# Patient Record
Sex: Male | Born: 1960
Health system: Southern US, Community
[De-identification: ages and names within clinical notes are randomized; demographics above are authoritative.]

## PROBLEM LIST (undated history)

## (undated) DIAGNOSIS — E785 Hyperlipidemia, unspecified: Secondary | ICD-10-CM

## (undated) DIAGNOSIS — I639 Cerebral infarction, unspecified: Secondary | ICD-10-CM

## (undated) DIAGNOSIS — I469 Cardiac arrest, cause unspecified: Secondary | ICD-10-CM

## (undated) DIAGNOSIS — R059 Cough, unspecified: Secondary | ICD-10-CM

## (undated) DIAGNOSIS — J45909 Unspecified asthma, uncomplicated: Secondary | ICD-10-CM

## (undated) DIAGNOSIS — I6330 Cerebral infarction due to thrombosis of unspecified cerebral artery: Secondary | ICD-10-CM

## (undated) DIAGNOSIS — R05 Cough: Secondary | ICD-10-CM

## (undated) DIAGNOSIS — R0789 Other chest pain: Secondary | ICD-10-CM

## (undated) DIAGNOSIS — N179 Acute kidney failure, unspecified: Secondary | ICD-10-CM

## (undated) DIAGNOSIS — K922 Gastrointestinal hemorrhage, unspecified: Secondary | ICD-10-CM

## (undated) DIAGNOSIS — I10 Essential (primary) hypertension: Secondary | ICD-10-CM

## (undated) DIAGNOSIS — G2581 Restless legs syndrome: Secondary | ICD-10-CM

## (undated) DIAGNOSIS — N185 Chronic kidney disease, stage 5: Secondary | ICD-10-CM

## (undated) DIAGNOSIS — I340 Nonrheumatic mitral (valve) insufficiency: Secondary | ICD-10-CM

## (undated) DIAGNOSIS — I272 Pulmonary hypertension, unspecified: Secondary | ICD-10-CM

## (undated) DIAGNOSIS — I5033 Acute on chronic diastolic (congestive) heart failure: Secondary | ICD-10-CM

## (undated) DIAGNOSIS — I712 Thoracic aortic aneurysm, without rupture: Secondary | ICD-10-CM

## (undated) DIAGNOSIS — I7121 Aneurysm of the ascending aorta, without rupture: Secondary | ICD-10-CM

## (undated) DIAGNOSIS — N189 Chronic kidney disease, unspecified: Secondary | ICD-10-CM

## (undated) DIAGNOSIS — N186 End stage renal disease: Secondary | ICD-10-CM

## (undated) DIAGNOSIS — I350 Nonrheumatic aortic (valve) stenosis: Secondary | ICD-10-CM

## (undated) DIAGNOSIS — I160 Hypertensive urgency: Secondary | ICD-10-CM

## (undated) DIAGNOSIS — I071 Rheumatic tricuspid insufficiency: Secondary | ICD-10-CM

## (undated) DIAGNOSIS — K5900 Constipation, unspecified: Secondary | ICD-10-CM

## (undated) DIAGNOSIS — K219 Gastro-esophageal reflux disease without esophagitis: Secondary | ICD-10-CM

## (undated) DIAGNOSIS — R0600 Dyspnea, unspecified: Secondary | ICD-10-CM

## (undated) DIAGNOSIS — Z8673 Personal history of transient ischemic attack (TIA), and cerebral infarction without residual deficits: Secondary | ICD-10-CM

## (undated) DIAGNOSIS — R0602 Shortness of breath: Secondary | ICD-10-CM

## (undated) DIAGNOSIS — J9600 Acute respiratory failure, unspecified whether with hypoxia or hypercapnia: Secondary | ICD-10-CM

## (undated) DIAGNOSIS — I351 Nonrheumatic aortic (valve) insufficiency: Secondary | ICD-10-CM

## (undated) DIAGNOSIS — J189 Pneumonia, unspecified organism: Secondary | ICD-10-CM

## (undated) DIAGNOSIS — J449 Chronic obstructive pulmonary disease, unspecified: Secondary | ICD-10-CM

## (undated) DIAGNOSIS — I4892 Unspecified atrial flutter: Secondary | ICD-10-CM

## (undated) DIAGNOSIS — M1990 Unspecified osteoarthritis, unspecified site: Secondary | ICD-10-CM

## (undated) DIAGNOSIS — J45901 Unspecified asthma with (acute) exacerbation: Secondary | ICD-10-CM

## (undated) DIAGNOSIS — I959 Hypotension, unspecified: Secondary | ICD-10-CM

## (undated) DIAGNOSIS — Z992 Dependence on renal dialysis: Secondary | ICD-10-CM

## (undated) DIAGNOSIS — N19 Unspecified kidney failure: Secondary | ICD-10-CM

## (undated) DIAGNOSIS — H269 Unspecified cataract: Secondary | ICD-10-CM

## (undated) DIAGNOSIS — D649 Anemia, unspecified: Secondary | ICD-10-CM

## (undated) HISTORY — DX: Constipation, unspecified: K59.00

## (undated) HISTORY — PX: OTHER SURGICAL HISTORY: SHX169

## (undated) HISTORY — DX: Essential (primary) hypertension: I10

## (undated) HISTORY — DX: Chronic kidney disease, unspecified: N18.9

## (undated) HISTORY — DX: Cerebral infarction, unspecified: I63.9

## (undated) HISTORY — DX: Restless legs syndrome: G25.81

## (undated) HISTORY — DX: Personal history of transient ischemic attack (TIA), and cerebral infarction without residual deficits: Z86.73

## (undated) HISTORY — DX: Chronic kidney disease, stage 5: N18.5

## (undated) HISTORY — DX: Acute respiratory failure, unspecified whether with hypoxia or hypercapnia: J96.00

## (undated) HISTORY — DX: Pneumonia, unspecified organism: J18.9

## (undated) HISTORY — DX: End stage renal disease: N18.6

## (undated) HISTORY — DX: Acute kidney failure, unspecified: N17.9

## (undated) HISTORY — DX: Dependence on renal dialysis: Z99.2

## (undated) HISTORY — DX: Other chest pain: R07.89

## (undated) HISTORY — DX: Hypertensive urgency: I16.0

## (undated) HISTORY — DX: Cerebral infarction due to thrombosis of unspecified cerebral artery: I63.30

## (undated) HISTORY — DX: Unspecified kidney failure: N19

## (undated) HISTORY — DX: Unspecified asthma with (acute) exacerbation: J45.901

## (undated) HISTORY — PX: FRACTURE SURGERY: SHX138

## (undated) HISTORY — DX: Hyperlipidemia, unspecified: E78.5

## (undated) HISTORY — DX: Gastrointestinal hemorrhage, unspecified: K92.2

## (undated) SURGERY — Surgical Case
Anesthesia: *Unknown

## (undated) NOTE — *Deleted (*Deleted)
Pharmacy Resident Rounding Note - for learning purposes only, not an active part of the chart   S/o  Admit Complaint: dry cough, SOB, swelling  Anticoagulation  Infectious Disease Cardiovascular Endocrinology Gastrointestinal / Nutrition Neurology Nephrology T/T/Sat  Last session 11/9 (3.5h, 3L UF) -- HD 11/15 (ongoing) Aranesp 200 mcg x1  --no recent iron panel   K 5.7 >> gave lokelma x1, albuterol  Ca 8.7, phos 8.6 Alb 3.9  Hgb 8.1, PLT 130-150   Pulmonary Hematology / Oncology PTA Medication Issues Best Practices   ESRD -follow lytes, HD sessions -plan for aranesp, iron panel?

---
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
## 2007-01-19 ENCOUNTER — Emergency Department (HOSPITAL_COMMUNITY): Admission: EM | Admit: 2007-01-19 | Discharge: 2007-01-19 | Payer: Self-pay | Admitting: Emergency Medicine

## 2007-09-24 IMAGING — CR DG HAND COMPLETE 3+V*L*
3 series · 3 of 3 positions shown · non-contrast
Comparison: none

CLINICAL DATA: Hand caught in grinder, blunt trauma.  
 LEFT HAND ? 3 VIEWS ? 01/19/07:

[view not recorded (1 of 3)]
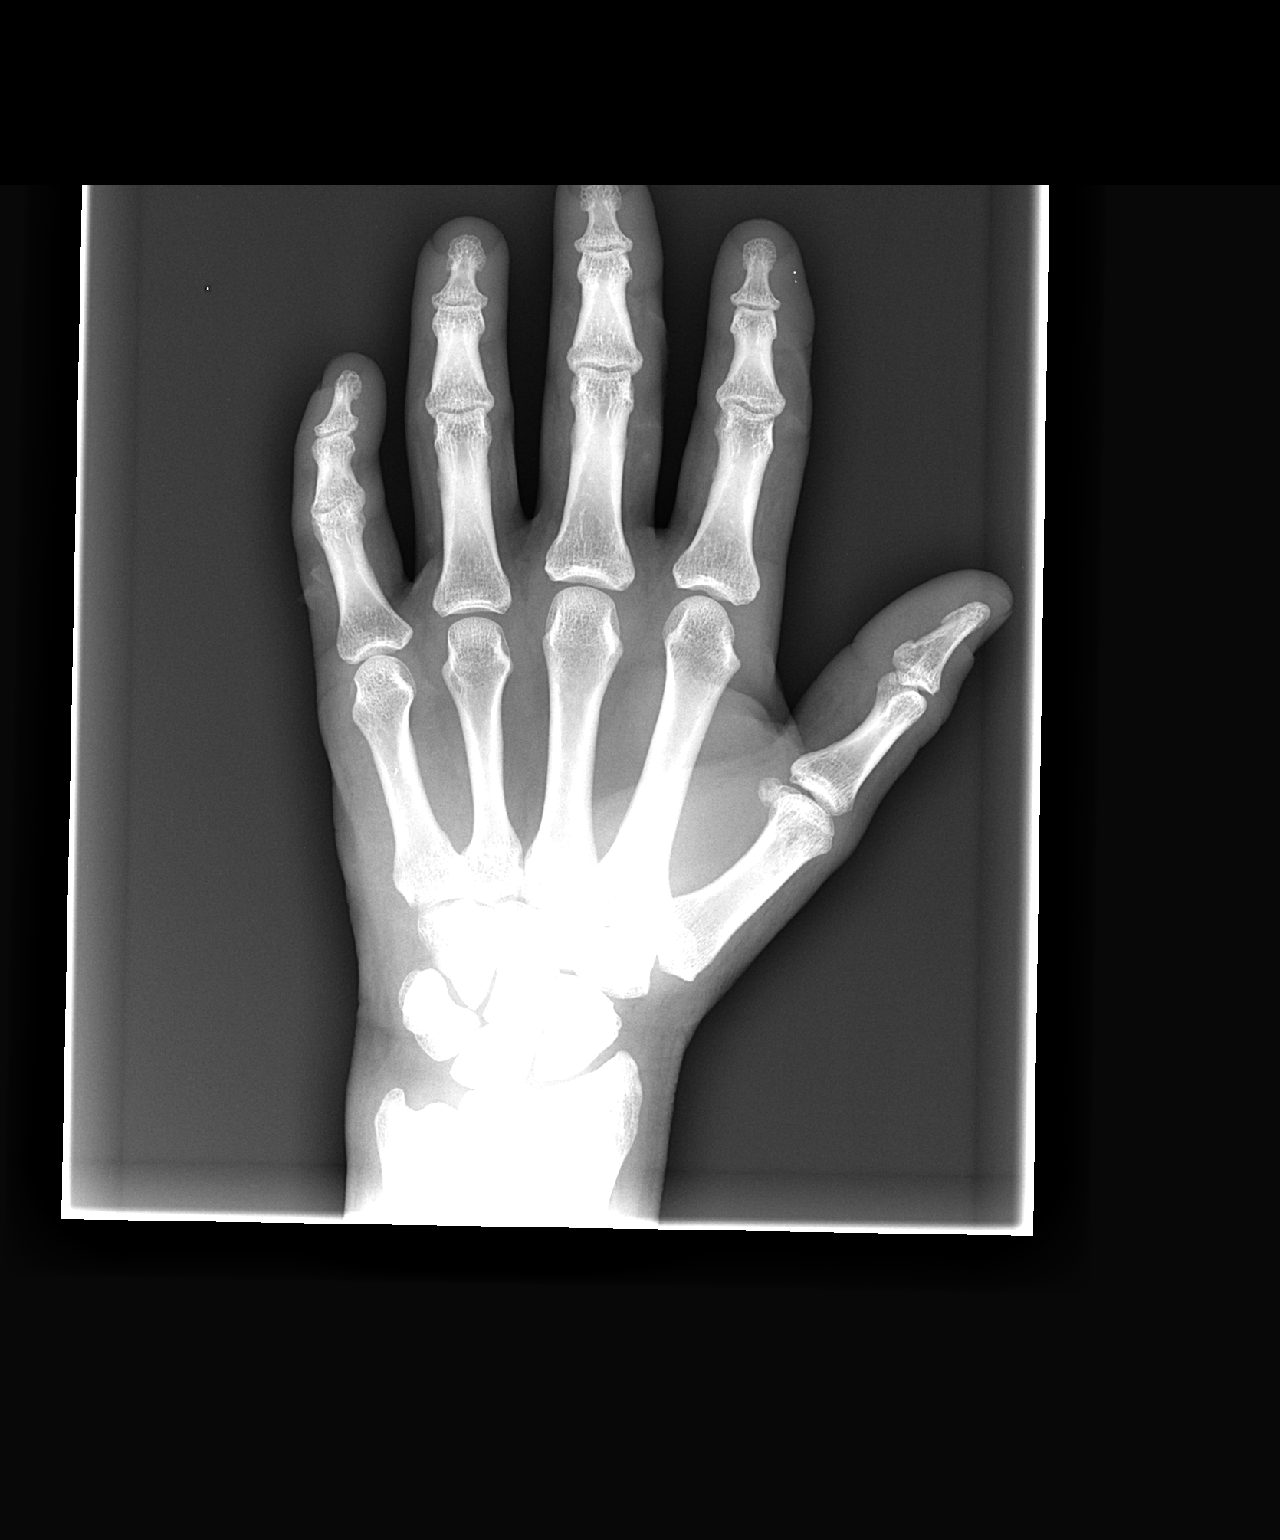

[view not recorded (2 of 3)]
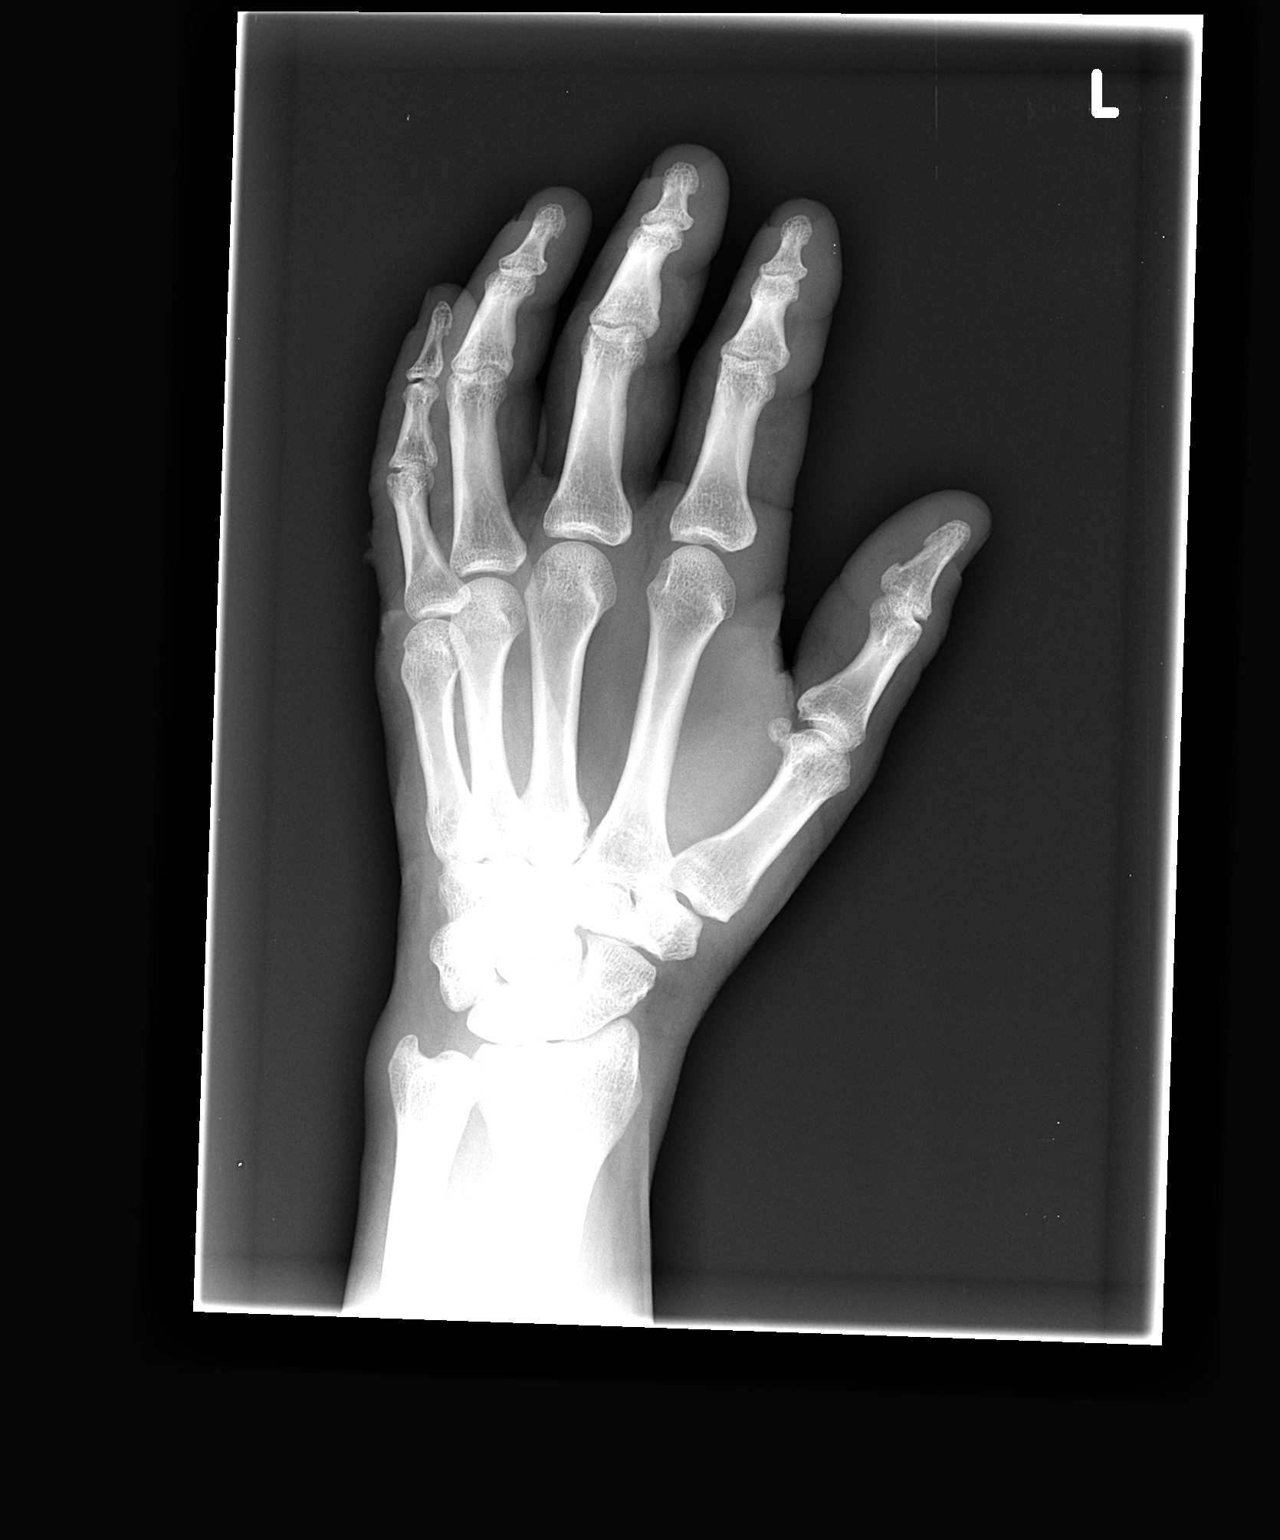

[view not recorded (3 of 3)]
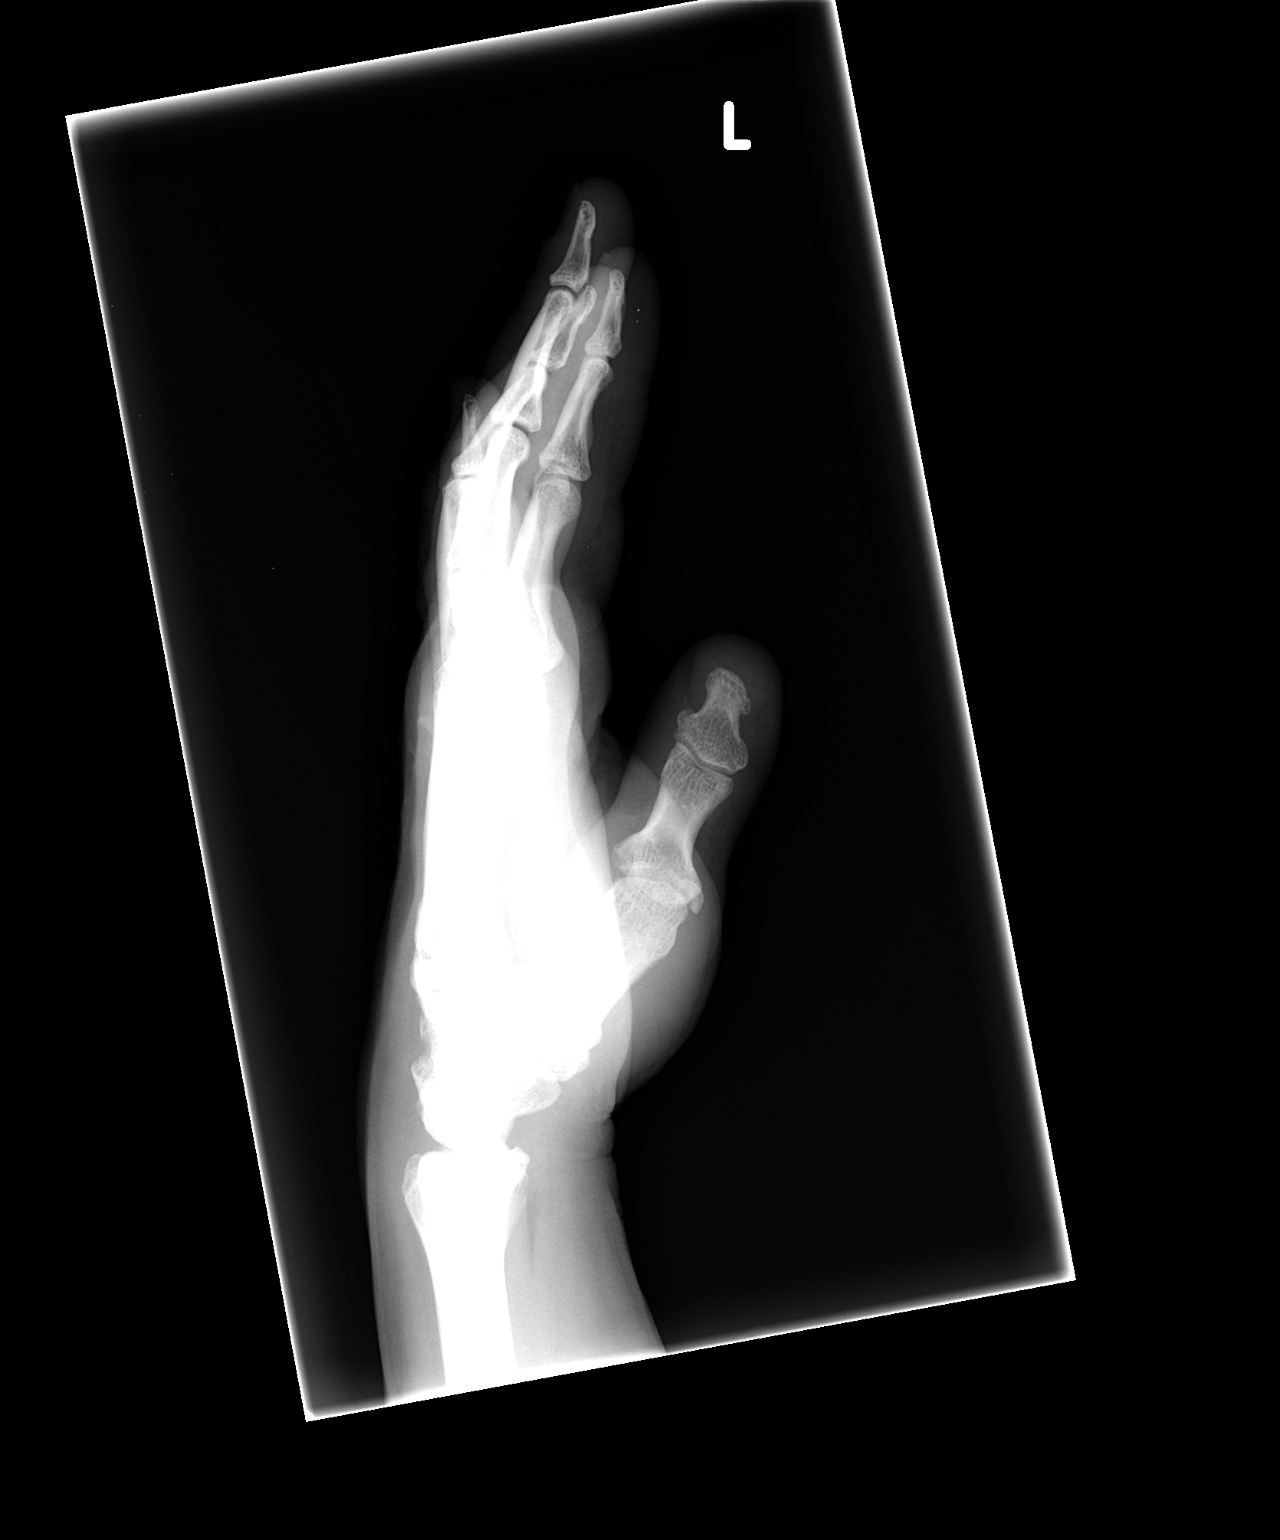

[3 of 3 positions shown; findings below may reference images not displayed]

FINDINGS: There is soft tissue irregularity posteriorly.  However, there is no evidence of bone or joint abnormality.
IMPRESSION: No bone or joint abnormality.

## 2013-03-23 ENCOUNTER — Emergency Department (HOSPITAL_COMMUNITY)
Admission: EM | Admit: 2013-03-23 | Discharge: 2013-03-23 | Disposition: A | Discharge status: Home / Self Care | Payer: Self-pay | Attending: Emergency Medicine | Admitting: Emergency Medicine

## 2013-03-23 ENCOUNTER — Encounter (HOSPITAL_COMMUNITY): Payer: Self-pay | Admitting: Emergency Medicine

## 2013-03-23 DIAGNOSIS — N289 Disorder of kidney and ureter, unspecified: Secondary | ICD-10-CM

## 2013-03-23 DIAGNOSIS — J45909 Unspecified asthma, uncomplicated: Secondary | ICD-10-CM | POA: Insufficient documentation

## 2013-03-23 DIAGNOSIS — I10 Essential (primary) hypertension: Secondary | ICD-10-CM

## 2013-03-23 HISTORY — DX: Unspecified asthma, uncomplicated: J45.909

## 2013-03-23 HISTORY — DX: Essential (primary) hypertension: I10

## 2013-03-23 LAB — CBC WITH DIFFERENTIAL/PLATELET
Basophils Absolute: 0 10*3/uL (ref 0.0–0.1)
Basophils Relative: 1 % (ref 0–1)
Eosinophils Absolute: 0.6 10*3/uL (ref 0.0–0.7)
Eosinophils Relative: 8 % — ABNORMAL HIGH (ref 0–5)
HCT: 45.1 % (ref 39.0–52.0)
Hemoglobin: 16.3 g/dL (ref 13.0–17.0)
Lymphocytes Relative: 26 % (ref 12–46)
Lymphs Abs: 1.9 10*3/uL (ref 0.7–4.0)
MCH: 32.9 pg (ref 26.0–34.0)
MCHC: 36.1 g/dL — ABNORMAL HIGH (ref 30.0–36.0)
MCV: 90.9 fL (ref 78.0–100.0)
Monocytes Absolute: 0.6 10*3/uL (ref 0.1–1.0)
Monocytes Relative: 9 % (ref 3–12)
Neutro Abs: 4.1 10*3/uL (ref 1.7–7.7)
Neutrophils Relative %: 57 % (ref 43–77)
Platelets: 204 10*3/uL (ref 150–400)
RBC: 4.96 MIL/uL (ref 4.22–5.81)
RDW: 12.8 % (ref 11.5–15.5)
WBC: 7.3 10*3/uL (ref 4.0–10.5)

## 2013-03-23 LAB — COMPREHENSIVE METABOLIC PANEL
ALT: 16 U/L (ref 0–53)
AST: 19 U/L (ref 0–37)
Albumin: 3.3 g/dL — ABNORMAL LOW (ref 3.5–5.2)
Alkaline Phosphatase: 69 U/L (ref 39–117)
BUN: 19 mg/dL (ref 6–23)
CO2: 28 mEq/L (ref 19–32)
Calcium: 9.3 mg/dL (ref 8.4–10.5)
Chloride: 105 mEq/L (ref 96–112)
Creatinine, Ser: 1.67 mg/dL — ABNORMAL HIGH (ref 0.50–1.35)
GFR calc Af Amer: 52 mL/min — ABNORMAL LOW (ref >90)
GFR calc non Af Amer: 45 mL/min — ABNORMAL LOW (ref >90)
Glucose, Bld: 101 mg/dL — ABNORMAL HIGH (ref 70–99)
Potassium: 3.6 mEq/L (ref 3.5–5.1)
Sodium: 142 mEq/L (ref 135–145)
Total Bilirubin: 0.4 mg/dL (ref 0.3–1.2)
Total Protein: 6.6 g/dL (ref 6.0–8.3)

## 2013-03-23 LAB — URINE MICROSCOPIC-ADD ON

## 2013-03-23 LAB — URINALYSIS, ROUTINE W REFLEX MICROSCOPIC
Bilirubin Urine: NEGATIVE
Glucose, UA: NEGATIVE mg/dL
Ketones, ur: NEGATIVE mg/dL
Leukocytes, UA: NEGATIVE
Nitrite: NEGATIVE
Protein, ur: >300 mg/dL — AB
Specific Gravity, Urine: 1.022 (ref 1.005–1.030)
Urobilinogen, UA: 0.2 mg/dL (ref 0.0–1.0)
pH: 5.5 (ref 5.0–8.0)

## 2013-03-23 MED ORDER — LISINOPRIL 10 MG PO TABS: 10.0000 mg | ORAL_TABLET | Freq: Every day | ORAL | Status: DC

## 2013-03-23 MED ORDER — ALBUTEROL SULFATE HFA 108 (90 BASE) MCG/ACT IN AERS
2.0000 | INHALATION_SPRAY | Freq: Once | RESPIRATORY_TRACT | Status: AC
  Administered 2013-03-23: 2 via RESPIRATORY_TRACT
  Filled 2013-03-23: qty 6.7

## 2013-03-23 NOTE — ED Notes (Signed)
Pt c/o hypertension when had medical screening exam; pt denies complaint

## 2013-03-23 NOTE — ED Notes (Signed)
Patient endorses use of someone elses Advair inhaler for respiratory Sx. Unsure of dose. Use every few days at the most. Left upper chest tightness. Sx not present at this time

## 2013-03-27 NOTE — ED Provider Notes (Signed)
CSN: [id number]     Arrival date & time 03/23/13  1600 History    51yM with hypertension. Noted on pre-employment evaluation and referred to ED. Pt reports told had hypertension as far back as 5 years ago. Never on meds. No regular medical care. Pt with no complaints. No frequent HA. No CP or SOB. No visual complaints. No issues with swelling. No urinary complaints. Non smoker. Exercises regularly.    First MD Initiated Contact with Patient 03/23/13 1658     Chief Complaint  Patient presents with  . Hypertension   (Consider location/radiation/quality/duration/timing/severity/associated sxs/prior Treatment) HPI  Past Medical History  Diagnosis Date  . Hypertension   . Asthma    History reviewed. No pertinent past surgical history. History reviewed. No pertinent family history. History  Substance Use Topics  . Smoking status: Never Smoker   . Smokeless tobacco: Not on file  . Alcohol Use: Yes    Review of Systems  All systems reviewed and negative, other than as noted in HPI.   Allergies  Review of patient's allergies indicates no known allergies.  Home Medications   Current Outpatient Rx  Name  Route  Sig  Dispense  Refill  . Multiple Vitamin (MULTIVITAMIN WITH MINERALS) TABS   Oral   Take 1 tablet by mouth daily.         [name] lisinopril (PRINIVIL,ZESTRIL) 10 MG tablet   Oral   Take 1 tablet (10 mg total) by mouth daily.   30 tablet   1    BP 207/120  Pulse 80  Temp(Src) 98.2 F (36.8 C) (Oral)  Resp 20  SpO2 97% Physical Exam  Nursing note and vitals reviewed. Constitutional: He appears well-developed and well-nourished. No distress.  HENT:  Head: Normocephalic and atraumatic.  Eyes: Conjunctivae are normal. Right eye exhibits no discharge. Left eye exhibits no discharge.  Neck: Neck supple.  Cardiovascular: Normal rate, regular rhythm and normal heart sounds.  Exam reveals no gallop and no friction rub.   No murmur heard. Pulmonary/Chest: Effort  normal and breath sounds normal. No respiratory distress.  Abdominal: Soft. He exhibits no distension. There is no tenderness.  Musculoskeletal: He exhibits no edema and no tenderness.  Neurological: He is alert.  Skin: Skin is warm and dry.  Psychiatric: He has a normal mood and affect. His behavior is normal. Thought content normal.    ED Course   Procedures (including critical care time)  Labs Reviewed  CBC WITH DIFFERENTIAL - Abnormal; Notable for the following:    MCHC 36.1 (*)    Eosinophils Relative 8 (*)    All other components within normal limits  COMPREHENSIVE METABOLIC PANEL - Abnormal; Notable for the following:    Glucose, Bld 101 (*)    Creatinine, Ser 1.67 (*)    Albumin 3.3 (*)    GFR calc non Af Amer 45 (*)    GFR calc Af Amer 52 (*)    All other components within normal limits  URINALYSIS, ROUTINE W REFLEX MICROSCOPIC - Abnormal; Notable for the following:    Hgb urine dipstick SMALL (*)    Protein, ur >300 (*)    All other components within normal limits  URINE MICROSCOPIC-ADD ON - Abnormal; Notable for the following:    Casts HYALINE CASTS (*)    All other components within normal limits   No results found. 1. Hypertension   2. Renal impairment     MDM  51yM with asymptomatic hypertension. Renal impairment which I suspect is  chronic and related at least partially to longstanding hypertension. Labs ordered by nursing. Prescribed low dose ACEI with likely CKD/protenuria. Discussed with pt that realistically will need to be increased and/or other agents added. Discussed that is most appropriate to be done by provider that can continue to follow him. DASH diet. Sounds like he regularly exercises already. Non smoker. Resource list provided.   Virgel Manifold, MD 03/27/13 1021

## 2015-04-09 ENCOUNTER — Inpatient Hospital Stay (HOSPITAL_COMMUNITY): Payer: MEDICAID

## 2015-04-09 ENCOUNTER — Encounter (HOSPITAL_COMMUNITY): Payer: Self-pay | Admitting: Emergency Medicine

## 2015-04-09 ENCOUNTER — Emergency Department (HOSPITAL_COMMUNITY): Payer: Self-pay

## 2015-04-09 ENCOUNTER — Inpatient Hospital Stay (HOSPITAL_COMMUNITY)
Admission: EM | Admit: 2015-04-09 | Discharge: 2015-04-12 | DRG: 065 | Disposition: A | Discharge status: Home Health | Payer: Self-pay | Attending: Internal Medicine | Admitting: Internal Medicine

## 2015-04-09 ENCOUNTER — Inpatient Hospital Stay (HOSPITAL_COMMUNITY): Payer: Self-pay

## 2015-04-09 DIAGNOSIS — N179 Acute kidney failure, unspecified: Secondary | ICD-10-CM

## 2015-04-09 DIAGNOSIS — Z8673 Personal history of transient ischemic attack (TIA), and cerebral infarction without residual deficits: Secondary | ICD-10-CM | POA: Insufficient documentation

## 2015-04-09 DIAGNOSIS — Z9119 Patient's noncompliance with other medical treatment and regimen: Secondary | ICD-10-CM | POA: Diagnosis present

## 2015-04-09 DIAGNOSIS — R05 Cough: Secondary | ICD-10-CM

## 2015-04-09 DIAGNOSIS — R059 Cough, unspecified: Secondary | ICD-10-CM

## 2015-04-09 DIAGNOSIS — G8194 Hemiplegia, unspecified affecting left nondominant side: Secondary | ICD-10-CM | POA: Diagnosis present

## 2015-04-09 DIAGNOSIS — I6330 Cerebral infarction due to thrombosis of unspecified cerebral artery: Secondary | ICD-10-CM | POA: Insufficient documentation

## 2015-04-09 DIAGNOSIS — D509 Iron deficiency anemia, unspecified: Secondary | ICD-10-CM | POA: Diagnosis present

## 2015-04-09 DIAGNOSIS — N184 Chronic kidney disease, stage 4 (severe): Secondary | ICD-10-CM | POA: Diagnosis present

## 2015-04-09 DIAGNOSIS — E785 Hyperlipidemia, unspecified: Secondary | ICD-10-CM | POA: Diagnosis present

## 2015-04-09 DIAGNOSIS — I161 Hypertensive emergency: Secondary | ICD-10-CM

## 2015-04-09 DIAGNOSIS — I10 Essential (primary) hypertension: Secondary | ICD-10-CM | POA: Diagnosis present

## 2015-04-09 DIAGNOSIS — J45909 Unspecified asthma, uncomplicated: Secondary | ICD-10-CM | POA: Diagnosis present

## 2015-04-09 DIAGNOSIS — Z79899 Other long term (current) drug therapy: Secondary | ICD-10-CM

## 2015-04-09 DIAGNOSIS — R29810 Facial weakness: Secondary | ICD-10-CM | POA: Diagnosis present

## 2015-04-09 DIAGNOSIS — I129 Hypertensive chronic kidney disease with stage 1 through stage 4 chronic kidney disease, or unspecified chronic kidney disease: Secondary | ICD-10-CM | POA: Diagnosis present

## 2015-04-09 DIAGNOSIS — R609 Edema, unspecified: Secondary | ICD-10-CM

## 2015-04-09 DIAGNOSIS — N189 Chronic kidney disease, unspecified: Secondary | ICD-10-CM

## 2015-04-09 DIAGNOSIS — I639 Cerebral infarction, unspecified: Principal | ICD-10-CM | POA: Diagnosis present

## 2015-04-09 LAB — COMPREHENSIVE METABOLIC PANEL
ALBUMIN: 3.9 g/dL (ref 3.5–5.0)
ALT: 26 U/L (ref 17–63)
AST: 23 U/L (ref 15–41)
Alkaline Phosphatase: 70 U/L (ref 38–126)
Anion gap: 12 (ref 5–15)
BILIRUBIN TOTAL: 1.4 mg/dL — AB (ref 0.3–1.2)
BUN: 53 mg/dL — AB (ref 6–20)
CALCIUM: 9.1 mg/dL (ref 8.9–10.3)
CHLORIDE: 105 mmol/L (ref 101–111)
CO2: 21 mmol/L — AB (ref 22–32)
Creatinine, Ser: 5.13 mg/dL — ABNORMAL HIGH (ref 0.61–1.24)
GFR calc Af Amer: 13 mL/min — ABNORMAL LOW (ref >60)
GFR, EST NON AFRICAN AMERICAN: 11 mL/min — AB (ref >60)
GLUCOSE: 100 mg/dL — AB (ref 65–99)
Potassium: 3.4 mmol/L — ABNORMAL LOW (ref 3.5–5.1)
Sodium: 138 mmol/L (ref 135–145)
Total Protein: 6.8 g/dL (ref 6.5–8.1)

## 2015-04-09 LAB — CREATININE, SERUM
Creatinine, Ser: 4.99 mg/dL — ABNORMAL HIGH (ref 0.61–1.24)
GFR calc Af Amer: 14 mL/min — ABNORMAL LOW (ref >60)
GFR calc non Af Amer: 12 mL/min — ABNORMAL LOW (ref >60)

## 2015-04-09 LAB — URINALYSIS, ROUTINE W REFLEX MICROSCOPIC
BILIRUBIN URINE: NEGATIVE
Glucose, UA: NEGATIVE mg/dL
Ketones, ur: NEGATIVE mg/dL
Leukocytes, UA: NEGATIVE
NITRITE: NEGATIVE
Protein, ur: 100 mg/dL — AB
Specific Gravity, Urine: 1.011 (ref 1.005–1.030)
Urobilinogen, UA: 0.2 mg/dL (ref 0.0–1.0)
pH: 5.5 (ref 5.0–8.0)

## 2015-04-09 LAB — I-STAT CHEM 8, ED
BUN: 50 mg/dL — ABNORMAL HIGH (ref 6–20)
CHLORIDE: 106 mmol/L (ref 101–111)
CREATININE: 5.2 mg/dL — AB (ref 0.61–1.24)
Calcium, Ion: 1.15 mmol/L (ref 1.12–1.23)
Glucose, Bld: 94 mg/dL (ref 65–99)
HEMATOCRIT: 33 % — AB (ref 39.0–52.0)
HEMOGLOBIN: 11.2 g/dL — AB (ref 13.0–17.0)
Potassium: 3.4 mmol/L — ABNORMAL LOW (ref 3.5–5.1)
Sodium: 138 mmol/L (ref 135–145)
TCO2: 21 mmol/L (ref 0–100)

## 2015-04-09 LAB — DIFFERENTIAL
Basophils Absolute: 0.1 10*3/uL (ref 0.0–0.1)
Basophils Relative: 1 % (ref 0–1)
Eosinophils Absolute: 0.1 10*3/uL (ref 0.0–0.7)
Eosinophils Relative: 3 % (ref 0–5)
LYMPHS PCT: 15 % (ref 12–46)
Lymphs Abs: 0.7 10*3/uL (ref 0.7–4.0)
MONO ABS: 0.4 10*3/uL (ref 0.1–1.0)
Monocytes Relative: 8 % (ref 3–12)
NEUTROS ABS: 3.3 10*3/uL (ref 1.7–7.7)
Neutrophils Relative %: 73 % (ref 43–77)

## 2015-04-09 LAB — CREATININE, URINE, RANDOM: Creatinine, Urine: 47.69 mg/dL

## 2015-04-09 LAB — CBC
HCT: 32.1 % — ABNORMAL LOW (ref 39.0–52.0)
HCT: 30.8 % — ABNORMAL LOW (ref 39.0–52.0)
HEMOGLOBIN: 10.3 g/dL — AB (ref 13.0–17.0)
Hemoglobin: 10.2 g/dL — ABNORMAL LOW (ref 13.0–17.0)
MCH: 29 pg (ref 26.0–34.0)
MCH: 29.7 pg (ref 26.0–34.0)
MCHC: 32.1 g/dL (ref 30.0–36.0)
MCHC: 33.1 g/dL (ref 30.0–36.0)
MCV: 90.4 fL (ref 78.0–100.0)
MCV: 89.5 fL (ref 78.0–100.0)
PLATELETS: 163 10*3/uL (ref 150–400)
Platelets: 158 10*3/uL (ref 150–400)
RBC: 3.55 MIL/uL — AB (ref 4.22–5.81)
RBC: 3.44 MIL/uL — AB (ref 4.22–5.81)
RDW: 15.3 % (ref 11.5–15.5)
RDW: 15.4 % (ref 11.5–15.5)
WBC: 4.5 10*3/uL (ref 4.0–10.5)
WBC: 4.8 10*3/uL (ref 4.0–10.5)

## 2015-04-09 LAB — PROTIME-INR
INR: 1.08 (ref 0.00–1.49)
Prothrombin Time: 14.2 seconds (ref 11.6–15.2)

## 2015-04-09 LAB — PROTEIN, URINE, RANDOM: Total Protein, Urine: 162 mg/dL

## 2015-04-09 LAB — APTT: aPTT: 30 seconds (ref 24–37)

## 2015-04-09 LAB — URINE MICROSCOPIC-ADD ON

## 2015-04-09 LAB — I-STAT TROPONIN, ED: TROPONIN I, POC: 0.05 ng/mL (ref 0.00–0.08)

## 2015-04-09 LAB — CBG MONITORING, ED: GLUCOSE-CAPILLARY: 92 mg/dL (ref 65–99)

## 2015-04-09 MED ORDER — ASPIRIN 81 MG PO CHEW
81.0000 mg | CHEWABLE_TABLET | Freq: Every day | ORAL | Status: DC
  Administered 2015-04-09 – 2015-04-12 (×4): 81 mg via ORAL
  Filled 2015-04-09 (×4): qty 1

## 2015-04-09 MED ORDER — ALBUTEROL SULFATE (2.5 MG/3ML) 0.083% IN NEBU: 3.0000 mL | INHALATION_SOLUTION | Freq: Four times a day (QID) | RESPIRATORY_TRACT | Status: DC | PRN

## 2015-04-09 MED ORDER — METOPROLOL TARTRATE 1 MG/ML IV SOLN
2.5000 mg | INTRAVENOUS | Status: DC | PRN
  Administered 2015-04-09: 2.5 mg via INTRAVENOUS
  Filled 2015-04-09: qty 5

## 2015-04-09 MED ORDER — METOPROLOL TARTRATE 1 MG/ML IV SOLN: 5.0000 mg | INTRAVENOUS | Status: DC | PRN

## 2015-04-09 MED ORDER — METOPROLOL TARTRATE 1 MG/ML IV SOLN
2.5000 mg | INTRAVENOUS | Status: AC
  Administered 2015-04-10: 2.5 mg via INTRAVENOUS
  Filled 2015-04-09: qty 5

## 2015-04-09 MED ORDER — NICARDIPINE HCL IN NACL 20-0.86 MG/200ML-% IV SOLN
5.0000 mg/h | Freq: Once | INTRAVENOUS | Status: AC
  Administered 2015-04-09: 5 mg/h via INTRAVENOUS
  Filled 2015-04-09: qty 200

## 2015-04-09 MED ORDER — HEPARIN SODIUM (PORCINE) 5000 UNIT/ML IJ SOLN
5000.0000 [IU] | Freq: Three times a day (TID) | INTRAMUSCULAR | Status: DC
  Administered 2015-04-09 – 2015-04-12 (×9): 5000 [IU] via SUBCUTANEOUS
  Filled 2015-04-09 (×9): qty 1

## 2015-04-09 MED ORDER — LISINOPRIL 10 MG PO TABS
10.0000 mg | ORAL_TABLET | Freq: Every day | ORAL | Status: DC
  Administered 2015-04-09 – 2015-04-10 (×2): 10 mg via ORAL
  Filled 2015-04-09 (×2): qty 1

## 2015-04-09 MED ORDER — ENSURE ENLIVE PO LIQD
237.0000 mL | Freq: Two times a day (BID) | ORAL | Status: DC
  Administered 2015-04-10: 237 mL via ORAL

## 2015-04-09 MED ORDER — ASPIRIN EC 81 MG PO TBEC
81.0000 mg | DELAYED_RELEASE_TABLET | Freq: Every day | ORAL | Status: DC
  Filled 2015-04-09: qty 1

## 2015-04-09 NOTE — ED Notes (Signed)
Brownings, PA at bedside and aware of BP

## 2015-04-09 NOTE — ED Notes (Signed)
Pt taken to MRI  

## 2015-04-09 NOTE — ED Notes (Signed)
Per Caryl Pina, RN on inpatient unit, pt can not be admitted on Cardene drip. Attempt has been made to call Dr. Annamaria Boots to determine how to proceed.

## 2015-04-09 NOTE — ED Notes (Addendum)
Pt reported having trouble walking with losing balance and falling x2. Noted swelling to lt leg. (+)PMS, CRT brisk, no deformity/injury noted.

## 2015-04-09 NOTE — ED Notes (Addendum)
Awake. Verbally responsive. A/O x4. Resp even and unlabored. No audible adventitious breath sounds noted. ABC's intact. SR on monitor. IV saline lock patent and intact. Pt noted with changes to neuro checks from earlier assessment. Pt decrease lt hand grip with drifting noted and lt facial numbness. Hospitalist at bedside. Decreased Procardia drip to 58ml/hr.

## 2015-04-09 NOTE — Consult Note (Signed)
Referring Physician: ED    Chief Complaint: left face-arm-leg weakness, imbalance, falls, abnormal CT brain  HPI:                                                                                                                                         Thomas Robinson is an 54 y.o. male, right handed, with a past medical history significant for untreated HTN for many years, and asthma, comes in for evaluation of lower extremity swelling and fatigue for the past week or so, and while being evaluated for such complains he also manifested difficulty using his left side as wel las left face droop.  Patient stated that this past Saturday he went to the gym and noticed some limitation using his left arm which was " kind of clumsy". The clumsiness/heaviness of the left arm did not get any better and subsequently he started having problems with balance and left leg heaviness which had caused him to fall x 3. This morning his coworkers noted that his left face was droopier than the right. Denies associated HA, vertigo, double vision, difficulty swallowing, slurred speech, language or vision impairment. Upon arrival to the ED noted to have BP 243/128 mmHg Creatinine is 5.2, last creatinine was only mildly elevated in 2014 at 1.6. CT brain was personally reviewed and showed two separate areas of asymmetric hypoattenuation as described in the RIGHT thalamus and RIGHT centrum semiovale concerning for acute ischemia.  Date last known well: unable to determine Time last known well: unable to determine tPA Given: no, late presentation   Past Medical History  Diagnosis Date  . Hypertension   . Asthma     History reviewed. No pertinent past surgical history.  History reviewed. No pertinent family history. Social History:  reports that he has never smoked. He does not have any smokeless tobacco history on file. He reports that he drinks alcohol. He reports that he does not use illicit drugs. Family history: no  MS, epilepsy, brain tumors, or brain aneurysm. Allergies: No Known Allergies  Medications:                                                                                                                           I have reviewed the patient's current medications.  ROS:  History obtained from family, chart review and the patient  General ROS: negative for - chills, fatigue, fever, night sweats, weight gain or weight loss Psychological ROS: negative for - behavioral disorder, hallucinations, memory difficulties, mood swings or suicidal ideation Ophthalmic ROS: negative for - blurry vision, double vision, eye pain or loss of vision ENT ROS: negative for - epistaxis, nasal discharge, oral lesions, sore throat, tinnitus or vertigo Allergy and Immunology ROS: negative for - hives or itchy/watery eyes Hematological and Lymphatic ROS: negative for - bleeding problems, bruising or swollen lymph nodes Endocrine ROS: negative for - galactorrhea, hair pattern changes, polydipsia/polyuria or temperature intolerance Respiratory ROS: negative for - cough, hemoptysis, shortness of breath or wheezing Cardiovascular ROS: negative for - chest pain, dyspnea on exertion, edema or irregular heartbeat Gastrointestinal ROS: negative for - abdominal pain, diarrhea, hematemesis, nausea/vomiting or stool incontinence Genito-Urinary ROS: negative for - dysuria, hematuria, incontinence or urinary frequency/urgency Musculoskeletal ROS: negative for - joint swelling Neurological ROS: as noted in HPI Dermatological ROS: negative for rash and skin lesion changes    Physical exam: pleasant male in no apparent distress. Blood pressure 186/87, pulse 96, resp. rate 18, SpO2 93 %. Head: normocephalic. Neck: supple, no bruits, no JVD. Cardiac: no murmurs. Lungs: clear. Abdomen: soft, no  tender, no mass. Extremities: bilateral LE pitting edema but no clubbing or cyanosis. Skin: no rash  Neurologic Examination:                                                                                                      General: Mental Status: Alert, oriented, thought content appropriate.  Speech fluent without evidence of aphasia.  Able to follow 3 step commands without difficulty. Cranial Nerves: II: Discs flat bilaterally; Visual fields grossly normal, pupils equal, round, reactive to light and accommodation III,IV, VI: ptosis not present, extra-ocular motions intact bilaterally V,VII: smile asymmetric due to mild left lower face weakness, facial light touch sensation diminished in the left. VIII: hearing normal bilaterally IX,X: uvula rises symmetrically XI: bilateral shoulder shrug XII: midline tongue extension without atrophy or fasciculations  Motor: Significant for right arm weakness Tone and bulk:normal tone throughout; no atrophy noted Sensory: Pinprick and light touch diminished in the left side. Deep Tendon Reflexes:  Brisk all over Plantars: Right: downgoing   Left: upgoing Cerebellar: normal finger-to-nose in the right but modestly compromised in the left due to weakness,  normal heel-to-shin test Gait:  No tested due to multiple leads.  Results for orders placed or performed during the hospital encounter of 04/09/15 (from the past 48 hour(s))  Protime-INR     Status: None   Collection Time: 04/09/15  1:20 PM  Result Value Ref Range   Prothrombin Time 14.2 11.6 - 15.2 seconds   INR 1.08 0.00 - 1.49  APTT     Status: None   Collection Time: 04/09/15  1:20 PM  Result Value Ref Range   aPTT 30 24 - 37 seconds  CBC     Status: Abnormal   Collection Time: 04/09/15  1:20 PM  Result Value Ref Range  WBC 4.5 4.0 - 10.5 K/uL   RBC 3.55 (L) 4.22 - 5.81 MIL/uL   Hemoglobin 10.3 (L) 13.0 - 17.0 g/dL   HCT 32.1 (L) 39.0 - 52.0 %   MCV 90.4 78.0 - 100.0 fL   MCH  29.0 26.0 - 34.0 pg   MCHC 32.1 30.0 - 36.0 g/dL   RDW 15.3 11.5 - 15.5 %   Platelets 163 150 - 400 K/uL  Differential     Status: None   Collection Time: 04/09/15  1:20 PM  Result Value Ref Range   Neutrophils Relative % 73 43 - 77 %   Neutro Abs 3.3 1.7 - 7.7 K/uL   Lymphocytes Relative 15 12 - 46 %   Lymphs Abs 0.7 0.7 - 4.0 K/uL   Monocytes Relative 8 3 - 12 %   Monocytes Absolute 0.4 0.1 - 1.0 K/uL   Eosinophils Relative 3 0 - 5 %   Eosinophils Absolute 0.1 0.0 - 0.7 K/uL   Basophils Relative 1 0 - 1 %   Basophils Absolute 0.1 0.0 - 0.1 K/uL  Comprehensive metabolic panel     Status: Abnormal   Collection Time: 04/09/15  1:20 PM  Result Value Ref Range   Sodium 138 135 - 145 mmol/L   Potassium 3.4 (L) 3.5 - 5.1 mmol/L   Chloride 105 101 - 111 mmol/L   CO2 21 (L) 22 - 32 mmol/L   Glucose, Bld 100 (H) 65 - 99 mg/dL   BUN 53 (H) 6 - 20 mg/dL   Creatinine, Ser 5.13 (H) 0.61 - 1.24 mg/dL   Calcium 9.1 8.9 - 10.3 mg/dL   Total Protein 6.8 6.5 - 8.1 g/dL   Albumin 3.9 3.5 - 5.0 g/dL   AST 23 15 - 41 U/L   ALT 26 17 - 63 U/L   Alkaline Phosphatase 70 38 - 126 U/L   Total Bilirubin 1.4 (H) 0.3 - 1.2 mg/dL   GFR calc non Af Amer 11 (L) >60 mL/min   GFR calc Af Amer 13 (L) >60 mL/min    Comment: (NOTE) The eGFR has been calculated using the CKD EPI equation. This calculation has not been validated in all clinical situations. eGFR's persistently <60 mL/min signify possible Chronic Kidney Disease.    Anion gap 12 5 - 15  I-stat troponin, ED (not at [facility], [facility])     Status: None   Collection Time: 04/09/15  1:27 PM  Result Value Ref Range   Troponin i, poc 0.05 0.00 - 0.08 ng/mL   Comment 3            Comment: Due to the release kinetics of cTnI, a negative result within the first hours of the onset of symptoms does not rule out myocardial infarction with certainty. If myocardial infarction is still suspected, repeat the test at appropriate intervals.   CBG monitoring, ED      Status: None   Collection Time: 04/09/15  1:29 PM  Result Value Ref Range   Glucose-Capillary 92 65 - 99 mg/dL  I-Stat Chem 8, ED  (not at [facility])     Status: Abnormal   Collection Time: 04/09/15  1:30 PM  Result Value Ref Range   Sodium 138 135 - 145 mmol/L   Potassium 3.4 (L) 3.5 - 5.1 mmol/L   Chloride 106 101 - 111 mmol/L   BUN 50 (H) 6 - 20 mg/dL   Creatinine, Ser 5.20 (H) 0.61 - 1.24 mg/dL   Glucose, Bld 94 65 -  99 mg/dL   Calcium, Ion 1.15 1.12 - 1.23 mmol/L   TCO2 21 0 - 100 mmol/L   Hemoglobin 11.2 (L) 13.0 - 17.0 g/dL   HCT 33.0 (L) 39.0 - 52.0 %  Urinalysis, Routine w reflex microscopic (not at [facility])     Status: Abnormal   Collection Time: 04/09/15  3:27 PM  Result Value Ref Range   Color, Urine YELLOW YELLOW   APPearance CLEAR CLEAR   Specific Gravity, Urine 1.011 1.005 - 1.030   pH 5.5 5.0 - 8.0   Glucose, UA NEGATIVE NEGATIVE mg/dL   Hgb urine dipstick SMALL (A) NEGATIVE   Bilirubin Urine NEGATIVE NEGATIVE   Ketones, ur NEGATIVE NEGATIVE mg/dL   Protein, ur 100 (A) NEGATIVE mg/dL   Urobilinogen, UA 0.2 0.0 - 1.0 mg/dL   Nitrite NEGATIVE NEGATIVE   Leukocytes, UA NEGATIVE NEGATIVE  Urine microscopic-add on     Status: None   Collection Time: 04/09/15  3:27 PM  Result Value Ref Range   WBC, UA 0-2 <3 WBC/hpf   Ct Head Wo Contrast  04/09/2015   CLINICAL DATA:  LEFT-sided facial droop and LEFT-sided weakness noted at approximately 0815 hours earlier today. Initial encounter.  EXAM: CT HEAD WITHOUT CONTRAST  TECHNIQUE: Contiguous axial images were obtained from the base of the skull through the vertex without intravenous contrast.  COMPARISON:  None.  FINDINGS: There is an asymmetric area of hypoattenuation RIGHT centrum semiovale (see for instance image 15) which could represent an acute infarct. Correlate clinically. If no contraindications, MRI could confirm or exclude.  Similarly, there is a 1 cm sized area of asymmetric hypoattenuation in the RIGHT  anterolateral thalamus as seen on image 12. Correlate clinically for LEFT-sided numbness. Confirm or exclude with MRI.  No hemorrhage, mass lesion, hydrocephalus, or extra-axial fluid.  Normal for age cerebral volume. Moderately advanced and likely premature hypoattenuation of the periventricular and subcortical white matter, consistent with chronic microvascular ischemic change. Moderate vascular calcification in the carotid siphons.  Calvarium is intact.  There is no sinus or mastoid disease.  IMPRESSION: Two separate areas of asymmetric hypoattenuation as described in the RIGHT thalamus and RIGHT centrum semiovale. In the appropriate clinical setting, these may represent acute bland ischemia. See discussion above.  Suspected small vessel disease of the supratentorial white matter.   Electronically Signed   By: Staci Righter M.D.   On: 04/09/2015 14:55   US Renal  04/09/2015   CLINICAL DATA:  Acute kidney injury.  EXAM: RENAL / URINARY TRACT ULTRASOUND COMPLETE  COMPARISON:  None.  FINDINGS: Right Kidney:  Length: 9.4 cm. Increased renal cortical echogenicity. No hydronephrosis. 6 x 5 x 5 mm anechoic right renal mass consistent with a small cyst.  Left Kidney:  Length: 8.5 cm. Increased renal cortical echogenicity. No mass or hydronephrosis visualized.  Bladder:  Appears normal for degree of bladder distention.  IMPRESSION: 1. No obstructive uropathy. 2. Increased renal cortical echogenicity as can be seen with medical renal disease.   Electronically Signed   By: Kathreen Devoid   On: 04/09/2015 16:17    Assessment: 54 y.o. male with known HTN but poor adherence to treatment, presents with left face-arm weakness, imbalance for few days, falls . Noted to have SBP 243 in the ED (admits he hasn't been taking BP medication in a long time). CT brain with findings concerning for RIGHT thalamus and RIGHT centrum semiovale acute ischemia. Patient did not receive thrombolysis due to late presentation. Admit to  medicine. Complete  stroke work up. Aspirin after passing swallowing evaluation. PT Stroke team will resume care tomorrow.  Stroke Risk Factors - HTN  Plan: 1. HgbA1c, fasting lipid panel 2. MRI, MRA  of the brain without contrast 3. Echocardiogram 4. Carotid dopplers 5. Prophylactic therapy-aspirin 6. Risk factor modification 7. Telemetry monitoring 8. Frequent neuro checks 9. PT/OT SLP 10. NPO  Dorian Pod, MD [facility] [phone]  04/09/2015, 4:31 PM

## 2015-04-09 NOTE — ED Notes (Addendum)
Pt reports multiple complaints- multiple recent falls "when shifting weight onto the right leg." Denies dizziness before falling. Also has HTN and does not take BP medications. Both legs are visibly swollen and red distally. Says, "Someone at work told me my mouth was drooping on one side." This was noted at approximately 0815 this morning. Left sided deficit noted with left sided facial droop and arm drift. Able to speak clearly. No drooling noted. PA Laisure in to speak with patient. Does not want to call code stroke d/t being out of the time window.

## 2015-04-09 NOTE — Progress Notes (Signed)
Pt arrived to room 4N06 via carelink. Pt is alert and oriented and in no distress.  Safety measures in place. Will continue to monitor.   Fredrich Romans, RN

## 2015-04-09 NOTE — Progress Notes (Signed)
CM spoke with pt who confirms uninsured [place] resident with no pcp.  CM discussed and provided written information for uninsured accepting pcps, discussed the importance of pcp vs EDP services for f/u care, www.needymeds.org, www.goodrx.com, discounted pharmacies and other [place] resources such as [facility] , [facility], affordable care act, financial assistance, uninsured dental services, [place] med assist, DSS and  health department  Reviewed resources for [place] uninsured accepting pcps like [facility], [facility] [address], [facility], [facility], local urgent care centers, [facility], [facility], [facility] clinics, [facility], [facility] plus others, medication resources, [facility] out patient pharmacies and housing Pt voiced understanding and appreciation of resources provided   Provided [facility] contact information Pt agreed to a referral Cm completed referral Pt to be contact by [facility] clinical liason  

## 2015-04-09 NOTE — H&P (Addendum)
History and Physical  Thomas Robinson [id number] DOB: April 09, 1961 DOA: 04/09/2015  Referring physician: EDP PCP: No PCP Per Patient   Chief Complaint:  Left sided weakness  HPI: Thomas Robinson is a 54 y.o. male   With h/o HTN, asthma, h/o medication and medical follow up noncompliance was brought to the ED due to left sided facial droop/left sided weakness, reported coworker "tricked" him in to seeing a doctor. He reported has been feeling clumsy since Saturday, coworker noticed he having left facial droop and left sided weakness, he denies dysarthria, no dysphagia, no falls, no fever,no chest pain, does reported progressive bilateral lower extremity edema, nocturnal dyspnea recently.  ED course: he was found to have hypertensive emergency with sbp in 190's and 200's. He denies headache. CT head with findings concerning for RIGHT thalamus and RIGHT centrum semiovale acute ischemia, he is also found to have elevated cr, no obstruction on renal ultrasound, ua no infection. Neurology consulted by EDP, cva work up ongoing, patient is to be admitted to [facility] per neurology recommendation, hosptialist called to admit the patient.  Review of Systems:  Detail per HPI, Review of systems are otherwise negative  Past Medical History  Diagnosis Date  . Hypertension   . Asthma    History reviewed. No pertinent past surgical history. Social History:  reports that he has never smoked. He does not have any smokeless tobacco history on file. He reports that he drinks alcohol. He reports that he does not use illicit drugs. Patient lives at home & is able to participate in activities of daily living independently   No Known Allergies  History reviewed. No pertinent family history.    Prior to Admission medications   Medication Sig Start Date End Date Taking? Authorizing Provider  acetaminophen (TYLENOL) 500 MG tablet Take 1,000 mg by mouth every 4 (four) hours as needed for mild pain or headache.    Yes Historical Provider, MD  albuterol (PROVENTIL HFA;VENTOLIN HFA) 108 (90 BASE) MCG/ACT inhaler Inhale 1-2 puffs into the lungs every 6 (six) hours as needed for wheezing or shortness of breath.   Yes Historical Provider, MD  Cyanocobalamin (VITAMIN B 12 PO) Take 1 tablet by mouth daily.   Yes Historical Provider, MD  DiphenhydrAMINE HCl (ZZZQUIL) 50 MG/30ML LIQD Take 30 mLs by mouth at bedtime as needed (for sleep).   Yes Historical Provider, MD  ePHEDrine-GuaiFENesin (PRIMATENE ASTHMA) 12.5-200 MG TABS Take 2 tablets by mouth 2 (two) times daily.   Yes Historical Provider, MD  Fluticasone-Salmeterol (ADVAIR) 100-50 MCG/DOSE AEPB Inhale 1 puff into the lungs daily as needed (for shortness of breath).   Yes Historical Provider, MD  Homeopathic Products ([employer] LEG CRAMPS PAIN RELIEF) TABS Take 2 tablets by mouth at bedtime as needed (for pain).   Yes Historical Provider, MD  Iron TABS Take 1 tablet by mouth daily.   Yes Historical Provider, MD  Multiple Vitamin (MULTIVITAMIN WITH MINERALS) TABS tablet Take 1 tablet by mouth daily.   Yes Historical Provider, MD  Multiple Vitamins-Minerals (EYE SUPPORT) TABS Take 1 tablet by mouth daily.   Yes Historical Provider, MD  naproxen sodium (ANAPROX) 220 MG tablet Take 440 mg by mouth 2 (two) times daily as needed (for pain).   Yes Historical Provider, MD  Omega-3 Fatty Acids (FISH OIL PO) Take 1 capsule by mouth daily.   Yes Historical Provider, MD  VITAMIN E PO Take 1 tablet by mouth daily.   Yes Historical Provider, MD  lisinopril (PRINIVIL,ZESTRIL)  10 MG tablet Take 1 tablet (10 mg total) by mouth daily. Patient not taking: Reported on 04/09/2015 03/23/13   Virgel Manifold, MD    Physical Exam: BP 186/87 mmHg  Pulse 96  Resp 18  SpO2 93%  General:  AAOx3, NAD Eyes: PERRL ENT: unremarkable Neck: supple, no JVD Cardiovascular: RRR Respiratory: crackles at bilateral basis Abdomen: soft/ND/ND, positive bowel sounds Skin: no rash Musculoskeletal:  2+  bilateral pitting edema Psychiatric: calm/cooperative Neurologic: left sided weakness which is improving since arrival to the ED.          Labs on Admission:  Basic Metabolic Panel:  Recent Labs Lab 04/09/15 1320 04/09/15 1330  NA 138 138  K 3.4* 3.4*  CL 105 106  CO2 21*  --   GLUCOSE 100* 94  BUN 53* 50*  CREATININE 5.13* 5.20*  CALCIUM 9.1  --    Liver Function Tests:  Recent Labs Lab 04/09/15 1320  AST 23  ALT 26  ALKPHOS 70  BILITOT 1.4*  PROT 6.8  ALBUMIN 3.9   No results for input(s): LIPASE, AMYLASE in the last 168 hours. No results for input(s): AMMONIA in the last 168 hours. CBC:  Recent Labs Lab 04/09/15 1320 04/09/15 1330  WBC 4.5  --   NEUTROABS 3.3  --   HGB 10.3* 11.2*  HCT 32.1* 33.0*  MCV 90.4  --   PLT 163  --    Cardiac Enzymes: No results for input(s): CKTOTAL, CKMB, CKMBINDEX, TROPONINI in the last 168 hours.  BNP (last 3 results) No results for input(s): BNP in the last 8760 hours.  ProBNP (last 3 results) No results for input(s): PROBNP in the last 8760 hours.  CBG:  Recent Labs Lab 04/09/15 1329  GLUCAP 92    Radiological Exams on Admission: Ct Head Wo Contrast  04/09/2015   CLINICAL DATA:  LEFT-sided facial droop and LEFT-sided weakness noted at approximately 0815 hours earlier today. Initial encounter.  EXAM: CT HEAD WITHOUT CONTRAST  TECHNIQUE: Contiguous axial images were obtained from the base of the skull through the vertex without intravenous contrast.  COMPARISON:  None.  FINDINGS: There is an asymmetric area of hypoattenuation RIGHT centrum semiovale (see for instance image 15) which could represent an acute infarct. Correlate clinically. If no contraindications, MRI could confirm or exclude.  Similarly, there is a 1 cm sized area of asymmetric hypoattenuation in the RIGHT anterolateral thalamus as seen on image 12. Correlate clinically for LEFT-sided numbness. Confirm or exclude with MRI.  No hemorrhage, mass lesion,  hydrocephalus, or extra-axial fluid.  Normal for age cerebral volume. Moderately advanced and likely premature hypoattenuation of the periventricular and subcortical white matter, consistent with chronic microvascular ischemic change. Moderate vascular calcification in the carotid siphons.  Calvarium is intact.  There is no sinus or mastoid disease.  IMPRESSION: Two separate areas of asymmetric hypoattenuation as described in the RIGHT thalamus and RIGHT centrum semiovale. In the appropriate clinical setting, these may represent acute bland ischemia. See discussion above.  Suspected small vessel disease of the supratentorial white matter.   Electronically Signed   By: Staci Righter M.D.   On: 04/09/2015 14:55   US Renal  04/09/2015   CLINICAL DATA:  Acute kidney injury.  EXAM: RENAL / URINARY TRACT ULTRASOUND COMPLETE  COMPARISON:  None.  FINDINGS: Right Kidney:  Length: 9.4 cm. Increased renal cortical echogenicity. No hydronephrosis. 6 x 5 x 5 mm anechoic right renal mass consistent with a small cyst.  Left Kidney:  Length:  8.5 cm. Increased renal cortical echogenicity. No mass or hydronephrosis visualized.  Bladder:  Appears normal for degree of bladder distention.  IMPRESSION: 1. No obstructive uropathy. 2. Increased renal cortical echogenicity as can be seen with medical renal disease.   Electronically Signed   By: Kathreen Devoid   On: 04/09/2015 16:17    EKG: Independently reviewed. Sinus rhythm, st/t flattening/depression lateral leads. Old anterior infarct? QTc wnl  Assessment/Plan Present on Admission:  . Hypertensive emergency . CVA (cerebral infarction)  Acute CVA: CT brain with findings concerning for RIGHT thalamus and RIGHT centrum semiovale acute ischemia. Embolic? Stroke work up pending, neurology consulted, will f/u on neurology rec's. Allow permissive htn , will treat if sbp >180 with prn iv lopressor.  ARF on ckd: with edema/bun 53. ua no infection, +hgb, + protein. Renal US no  obstruction.  Baseline cr 1.67 in 2014, progressive renal failure likely secondary to long standing uncontrolled HTN. Does has sign of volume overload, with 2+ pitting edema, cxr pending, does report progressive nocturnal dyspnea, report making good urine. Will check spot urine protein/cr ratio, spep/upep/hepatitis/hiv. Nephrology consulted.  HTN emergency: ED started cardene drip, allow permissive htn, keep sbp 170-180 for now.  DVT prophylaxis: heparin  Consultants:  Neurology Nephrology, talked to Dr. Joelyn Oms over the phone.  Code Status: full   Family Communication:  Patient   Disposition Plan: admit to med tele   Time spent: 55mins  Leobardo Granlund MD, PhD [facility] Pager [phone] If 7PM-7AM, please contact night-coverage at [email or web address], password [facility]

## 2015-04-09 NOTE — ED Notes (Addendum)
Pt returned from MRI without distress note. Awake. Verbally responsive. A/O x4. Resp even and unlabored. No audible adventitious breath sounds noted. ABC's intact. SR on monitor. IV saline lock patent and intact. Pt reported improvement to lt arm with movement. Continues Procardia drip at 63ml/hr d/t BP at 207/112. Family at bedside.

## 2015-04-09 NOTE — ED Notes (Signed)
Attempt to call report x 1  

## 2015-04-09 NOTE — ED Notes (Signed)
Dr. Devra Dopp at bedside and ordered to decrease Procardia drip to 6ml/hr and maintain SBP>185.

## 2015-04-09 NOTE — ED Provider Notes (Signed)
CSN: [id number]     Arrival date & time 04/09/15  1238 History   First MD Initiated Contact with Patient 04/09/15 1358     Chief Complaint  Patient presents with  . Weakness  . Hypertension  . Leg Swelling  . Fall     (Consider location/radiation/quality/duration/timing/severity/associated sxs/prior Treatment) HPI Comments: Patient presents to the emergency department with chief complaint of lower extremity swelling and fatigue. Patient states he has been having the symptoms for the past week or so. He also states that he has had some difficulty balancing. States that he has lost his balance and fallen twice in the past week. He denies any headache, chest pain, shortness of breath, nausea, or vomiting. Additionally, patient states someone at work told him that his mouth was drooping on one side. He states he did have some numbness sensation in his left arm. Denies any weakness. He reports that he does not have any difficulty urinating. He denies abdominal pain or flank pain.  The history is provided by the patient. No language interpreter was used.    Past Medical History  Diagnosis Date  . Hypertension   . Asthma    History reviewed. No pertinent past surgical history. History reviewed. No pertinent family history. History  Substance Use Topics  . Smoking status: Never Smoker   . Smokeless tobacco: Not on file  . Alcohol Use: Yes    Review of Systems  Constitutional: Negative for fever and chills.  Respiratory: Negative for shortness of breath.   Cardiovascular: Positive for leg swelling. Negative for chest pain.  Gastrointestinal: Negative for nausea, vomiting, diarrhea and constipation.  Genitourinary: Negative for dysuria.  Neurological: Positive for dizziness.  All other systems reviewed and are negative.     Allergies  Review of patient's allergies indicates no known allergies.  Home Medications   Prior to Admission medications   Medication Sig Start Date End  Date Taking? Authorizing Provider  acetaminophen (TYLENOL) 500 MG tablet Take 1,000 mg by mouth every 4 (four) hours as needed for mild pain or headache.   Yes Historical Provider, MD  albuterol (PROVENTIL HFA;VENTOLIN HFA) 108 (90 BASE) MCG/ACT inhaler Inhale 1-2 puffs into the lungs every 6 (six) hours as needed for wheezing or shortness of breath.   Yes Historical Provider, MD  Cyanocobalamin (VITAMIN B 12 PO) Take 1 tablet by mouth daily.   Yes Historical Provider, MD  DiphenhydrAMINE HCl (ZZZQUIL) 50 MG/30ML LIQD Take 30 mLs by mouth at bedtime as needed (for sleep).   Yes Historical Provider, MD  ePHEDrine-GuaiFENesin (PRIMATENE ASTHMA) 12.5-200 MG TABS Take 2 tablets by mouth 2 (two) times daily.   Yes Historical Provider, MD  Fluticasone-Salmeterol (ADVAIR) 100-50 MCG/DOSE AEPB Inhale 1 puff into the lungs daily as needed (for shortness of breath).   Yes Historical Provider, MD  Homeopathic Products ([employer] LEG CRAMPS PAIN RELIEF) TABS Take 2 tablets by mouth at bedtime as needed (for pain).   Yes Historical Provider, MD  Iron TABS Take 1 tablet by mouth daily.   Yes Historical Provider, MD  Multiple Vitamin (MULTIVITAMIN WITH MINERALS) TABS tablet Take 1 tablet by mouth daily.   Yes Historical Provider, MD  Multiple Vitamins-Minerals (EYE SUPPORT) TABS Take 1 tablet by mouth daily.   Yes Historical Provider, MD  naproxen sodium (ANAPROX) 220 MG tablet Take 440 mg by mouth 2 (two) times daily as needed (for pain).   Yes Historical Provider, MD  Omega-3 Fatty Acids (FISH OIL PO) Take 1 capsule by mouth  daily.   Yes Historical Provider, MD  VITAMIN E PO Take 1 tablet by mouth daily.   Yes Historical Provider, MD  lisinopril (PRINIVIL,ZESTRIL) 10 MG tablet Take 1 tablet (10 mg total) by mouth daily. Patient not taking: Reported on 04/09/2015 03/23/13   Virgel Manifold, MD   BP 243/128 mmHg  Pulse 87  Resp 15  SpO2 96% Physical Exam  Constitutional: He is oriented to person, place, and time. He  appears well-developed and well-nourished.  HENT:  Head: Normocephalic and atraumatic.  Eyes: Conjunctivae and EOM are normal. Pupils are equal, round, and reactive to light. Right eye exhibits no discharge. Left eye exhibits no discharge. No scleral icterus.  Neck: Normal range of motion. Neck supple. No JVD present.  Cardiovascular: Normal rate, regular rhythm and normal heart sounds.  Exam reveals no gallop and no friction rub.   No murmur heard. Pulmonary/Chest: Effort normal and breath sounds normal. No respiratory distress. He has no wheezes. He has no rales. He exhibits no tenderness.  Abdominal: Soft. He exhibits no distension and no mass. There is no tenderness. There is no rebound and no guarding.  Musculoskeletal: Normal range of motion. He exhibits edema. He exhibits no tenderness.  Initially moving all extremities Bilateral 1+ edema lower extremities  Neurological: He is alert and oriented to person, place, and time.  Mild left-sided facial droop  Developing left arm weakness  Skin: Skin is warm and dry.  Psychiatric: He has a normal mood and affect. His behavior is normal. Judgment and thought content normal.  Nursing note and vitals reviewed.   ED Course  Procedures (including critical care time) Results for orders placed or performed during the hospital encounter of 04/09/15  Protime-INR  Result Value Ref Range   Prothrombin Time 14.2 11.6 - 15.2 seconds   INR 1.08 0.00 - 1.49  APTT  Result Value Ref Range   aPTT 30 24 - 37 seconds  CBC  Result Value Ref Range   WBC 4.5 4.0 - 10.5 K/uL   RBC 3.55 (L) 4.22 - 5.81 MIL/uL   Hemoglobin 10.3 (L) 13.0 - 17.0 g/dL   HCT 32.1 (L) 39.0 - 52.0 %   MCV 90.4 78.0 - 100.0 fL   MCH 29.0 26.0 - 34.0 pg   MCHC 32.1 30.0 - 36.0 g/dL   RDW 15.3 11.5 - 15.5 %   Platelets 163 150 - 400 K/uL  Differential  Result Value Ref Range   Neutrophils Relative % 73 43 - 77 %   Neutro Abs 3.3 1.7 - 7.7 K/uL   Lymphocytes Relative 15 12  - 46 %   Lymphs Abs 0.7 0.7 - 4.0 K/uL   Monocytes Relative 8 3 - 12 %   Monocytes Absolute 0.4 0.1 - 1.0 K/uL   Eosinophils Relative 3 0 - 5 %   Eosinophils Absolute 0.1 0.0 - 0.7 K/uL   Basophils Relative 1 0 - 1 %   Basophils Absolute 0.1 0.0 - 0.1 K/uL  Comprehensive metabolic panel  Result Value Ref Range   Sodium 138 135 - 145 mmol/L   Potassium 3.4 (L) 3.5 - 5.1 mmol/L   Chloride 105 101 - 111 mmol/L   CO2 21 (L) 22 - 32 mmol/L   Glucose, Bld 100 (H) 65 - 99 mg/dL   BUN 53 (H) 6 - 20 mg/dL   Creatinine, Ser 5.13 (H) 0.61 - 1.24 mg/dL   Calcium 9.1 8.9 - 10.3 mg/dL   Total Protein 6.8 6.5 - 8.1  g/dL   Albumin 3.9 3.5 - 5.0 g/dL   AST 23 15 - 41 U/L   ALT 26 17 - 63 U/L   Alkaline Phosphatase 70 38 - 126 U/L   Total Bilirubin 1.4 (H) 0.3 - 1.2 mg/dL   GFR calc non Af Amer 11 (L) >60 mL/min   GFR calc Af Amer 13 (L) >60 mL/min   Anion gap 12 5 - 15  I-stat troponin, ED (not at [facility], [facility])  Result Value Ref Range   Troponin i, poc 0.05 0.00 - 0.08 ng/mL   Comment 3          CBG monitoring, ED  Result Value Ref Range   Glucose-Capillary 92 65 - 99 mg/dL  I-Stat Chem 8, ED  (not at [facility], [facility])  Result Value Ref Range   Sodium 138 135 - 145 mmol/L   Potassium 3.4 (L) 3.5 - 5.1 mmol/L   Chloride 106 101 - 111 mmol/L   BUN 50 (H) 6 - 20 mg/dL   Creatinine, Ser 5.20 (H) 0.61 - 1.24 mg/dL   Glucose, Bld 94 65 - 99 mg/dL   Calcium, Ion 1.15 1.12 - 1.23 mmol/L   TCO2 21 0 - 100 mmol/L   Hemoglobin 11.2 (L) 13.0 - 17.0 g/dL   HCT 33.0 (L) 39.0 - 52.0 %   Ct Head Wo Contrast  04/09/2015   CLINICAL DATA:  LEFT-sided facial droop and LEFT-sided weakness noted at approximately 0815 hours earlier today. Initial encounter.  EXAM: CT HEAD WITHOUT CONTRAST  TECHNIQUE: Contiguous axial images were obtained from the base of the skull through the vertex without intravenous contrast.  COMPARISON:  None.  FINDINGS: There is an asymmetric area of hypoattenuation RIGHT centrum semiovale (see  for instance image 15) which could represent an acute infarct. Correlate clinically. If no contraindications, MRI could confirm or exclude.  Similarly, there is a 1 cm sized area of asymmetric hypoattenuation in the RIGHT anterolateral thalamus as seen on image 12. Correlate clinically for LEFT-sided numbness. Confirm or exclude with MRI.  No hemorrhage, mass lesion, hydrocephalus, or extra-axial fluid.  Normal for age cerebral volume. Moderately advanced and likely premature hypoattenuation of the periventricular and subcortical white matter, consistent with chronic microvascular ischemic change. Moderate vascular calcification in the carotid siphons.  Calvarium is intact.  There is no sinus or mastoid disease.  IMPRESSION: Two separate areas of asymmetric hypoattenuation as described in the RIGHT thalamus and RIGHT centrum semiovale. In the appropriate clinical setting, these may represent acute bland ischemia. See discussion above.  Suspected small vessel disease of the supratentorial white matter.   Electronically Signed   By: Staci Righter M.D.   On: 04/09/2015 14:55     Imaging Review No results found.   EKG Interpretation None      MDM   Final diagnoses:  Hypertensive emergency  AKI (acute kidney injury)  Stroke   Patient with hypertensive emergency. Blood pressure 243/128. Creatinine is 5.2, last creatinine was only mildly elevated in 2014 at 1.6. Patient denies any history of kidney problems. He states that he knows he has high blood pressure, but tries to eat healthy and exercise. He has not had health insurance, and has not been taking any blood pressure medications because he does not have primary care follow-up.  Patient seen by and discussed with Dr. Lacinda Axon, who ordered nicardipine drip and recommends admission for hypertensive emergency.  CT remarkable for possible stroke.  Will consult neuro.  Spoke with Dr. Aram Beecham, who  recommends [facility] at [facility] and will see the patient.   Recommends getting stat MRI.  Spoke with Dr. Erlinda Hong, who will [facility] the [facility] to [facility].  Recommends adding renal US, UA, and foley catheter.  3:45 pm, patient reassessed by me.  Has some developing left arm weakness.  I discussed this with Dr. Lacinda Axon, who recommends allowing for permissive hypertension.  I spoke with the nurse who will titrate to maintain systolic pressure in the [id number].  Consulted Dr. Aram Beecham again and informed him of changes involving developing left arm weakness.  Dr. Aram Beecham to see the patient.  CRITICAL CARE Performed by: Montine Circle   Total critical care time: 45  Critical care time was exclusive of separately billable procedures and treating other patients.  Critical care was necessary to treat or prevent imminent or life-threatening deterioration.  Critical care was time spent personally by me on the following activities: development of treatment plan with patient and/or surrogate as well as nursing, discussions with consultants, evaluation of patient's response to treatment, examination of patient, obtaining history from patient or surrogate, ordering and performing treatments and interventions, ordering and review of laboratory studies, ordering and review of radiographic studies, pulse oximetry and re-evaluation of patient's condition.   Montine Circle, PA-C 04/09/15 [place], PA-C 04/09/15 [place], MD 04/10/15 [phone]

## 2015-04-09 NOTE — ED Notes (Signed)
Awake. Verbally responsive. A/O x4. Resp even and unlabored. No audible adventitious breath sounds noted. ABC's intact. SR on monitor. Elevation in BP increase Procardia drip to 41ml/hr. IV's saline lock patent and intact.  Pt refused F/C insertion at this time. Pt voided 471ml of clear yellow urine without difficulty. Family at bedside.

## 2015-04-09 NOTE — ED Notes (Signed)
Spoken to Dr. Annamaria Boots to get BP parameters order for Procardia drip.

## 2015-04-09 NOTE — ED Notes (Signed)
Per Beth RN, [facility]N at [facility] cannot accept pt d/t Cardene gtt.  Alcario Drought hospitalist and Tylene Fantasia PA made aware of this.  Gave VO to stopped Cardene gtt and to transport pt to [facility].

## 2015-04-09 NOTE — ED Notes (Signed)
Bed: WA24 Expected date:  Expected time:  Means of arrival:  Comments: Triage 

## 2015-04-10 ENCOUNTER — Ambulatory Visit (HOSPITAL_COMMUNITY): Payer: Self-pay

## 2015-04-10 ENCOUNTER — Telehealth: Payer: Self-pay

## 2015-04-10 DIAGNOSIS — I6330 Cerebral infarction due to thrombosis of unspecified cerebral artery: Secondary | ICD-10-CM

## 2015-04-10 DIAGNOSIS — N179 Acute kidney failure, unspecified: Secondary | ICD-10-CM | POA: Insufficient documentation

## 2015-04-10 DIAGNOSIS — I6789 Other cerebrovascular disease: Secondary | ICD-10-CM

## 2015-04-10 DIAGNOSIS — Z8673 Personal history of transient ischemic attack (TIA), and cerebral infarction without residual deficits: Secondary | ICD-10-CM | POA: Insufficient documentation

## 2015-04-10 LAB — PROTIME-INR
INR: 1.16 (ref 0.00–1.49)
Prothrombin Time: 15 seconds (ref 11.6–15.2)

## 2015-04-10 LAB — CBC
HEMATOCRIT: 29.7 % — AB (ref 39.0–52.0)
Hemoglobin: 9.8 g/dL — ABNORMAL LOW (ref 13.0–17.0)
MCH: 29.8 pg (ref 26.0–34.0)
MCHC: 33 g/dL (ref 30.0–36.0)
MCV: 90.3 fL (ref 78.0–100.0)
Platelets: 168 10*3/uL (ref 150–400)
RBC: 3.29 MIL/uL — ABNORMAL LOW (ref 4.22–5.81)
RDW: 15.6 % — ABNORMAL HIGH (ref 11.5–15.5)
WBC: 3.7 10*3/uL — ABNORMAL LOW (ref 4.0–10.5)

## 2015-04-10 LAB — COMPREHENSIVE METABOLIC PANEL
ALBUMIN: 2.8 g/dL — AB (ref 3.5–5.0)
ALT: 22 U/L (ref 17–63)
AST: 19 U/L (ref 15–41)
Alkaline Phosphatase: 57 U/L (ref 38–126)
Anion gap: 10 (ref 5–15)
BILIRUBIN TOTAL: 0.9 mg/dL (ref 0.3–1.2)
BUN: 48 mg/dL — ABNORMAL HIGH (ref 6–20)
CO2: 23 mmol/L (ref 22–32)
Calcium: 8.5 mg/dL — ABNORMAL LOW (ref 8.9–10.3)
Chloride: 106 mmol/L (ref 101–111)
Creatinine, Ser: 5.01 mg/dL — ABNORMAL HIGH (ref 0.61–1.24)
GFR calc Af Amer: 14 mL/min — ABNORMAL LOW (ref >60)
GFR calc non Af Amer: 12 mL/min — ABNORMAL LOW (ref >60)
Glucose, Bld: 179 mg/dL — ABNORMAL HIGH (ref 65–99)
Potassium: 3.4 mmol/L — ABNORMAL LOW (ref 3.5–5.1)
Sodium: 139 mmol/L (ref 135–145)
Total Protein: 5.1 g/dL — ABNORMAL LOW (ref 6.5–8.1)

## 2015-04-10 LAB — PROTEIN / CREATININE RATIO, URINE
CREATININE, URINE: 84.28 mg/dL
PROTEIN CREATININE RATIO: 1.78 mg/mg{creat} — AB (ref 0.00–0.15)
Total Protein, Urine: 150 mg/dL

## 2015-04-10 LAB — IRON AND TIBC
IRON: 33 ug/dL — AB (ref 45–182)
Saturation Ratios: 10 % — ABNORMAL LOW (ref 17.9–39.5)
TIBC: 337 ug/dL (ref 250–450)
UIBC: 304 ug/dL

## 2015-04-10 LAB — FERRITIN: Ferritin: 38 ng/mL (ref 24–336)

## 2015-04-10 LAB — PHOSPHORUS: Phosphorus: 4.6 mg/dL (ref 2.5–4.6)

## 2015-04-10 LAB — HEMOGLOBIN A1C
Hgb A1c MFr Bld: 5.1 % (ref 4.8–5.6)
MEAN PLASMA GLUCOSE: 100 mg/dL

## 2015-04-10 MED ORDER — FUROSEMIDE 10 MG/ML IJ SOLN
80.0000 mg | Freq: Two times a day (BID) | INTRAMUSCULAR | Status: DC
  Administered 2015-04-10 – 2015-04-11 (×2): 80 mg via INTRAVENOUS
  Filled 2015-04-10 (×2): qty 8

## 2015-04-10 MED ORDER — HYDRALAZINE HCL 20 MG/ML IJ SOLN
10.0000 mg | INTRAMUSCULAR | Status: AC
  Administered 2015-04-10: 10 mg via INTRAVENOUS
  Filled 2015-04-10: qty 1

## 2015-04-10 MED ORDER — LABETALOL HCL 5 MG/ML IV SOLN
10.0000 mg | INTRAVENOUS | Status: DC | PRN
  Administered 2015-04-10 – 2015-04-11 (×3): 10 mg via INTRAVENOUS
  Filled 2015-04-10 (×3): qty 4

## 2015-04-10 MED ORDER — AMLODIPINE BESYLATE 5 MG PO TABS
5.0000 mg | ORAL_TABLET | Freq: Every day | ORAL | Status: DC
  Administered 2015-04-10 – 2015-04-12 (×3): 5 mg via ORAL
  Filled 2015-04-10 (×3): qty 1

## 2015-04-10 MED ORDER — CLONIDINE HCL 0.1 MG PO TABS: 0.1000 mg | ORAL_TABLET | ORAL | Status: DC | PRN

## 2015-04-10 MED ORDER — METOPROLOL TARTRATE 25 MG PO TABS
25.0000 mg | ORAL_TABLET | Freq: Two times a day (BID) | ORAL | Status: DC
  Administered 2015-04-10 – 2015-04-12 (×5): 25 mg via ORAL
  Filled 2015-04-10 (×5): qty 1

## 2015-04-10 NOTE — Progress Notes (Signed)
Echocardiogram 2D Echocardiogram has been performed.  Thomas Robinson 04/10/2015, 10:11 AM

## 2015-04-10 NOTE — Progress Notes (Signed)
Preliminary results by tech - Carotid Duplex Completed. Mild intimal thickening with no evidence of a significant stenosis in the right or left carotid arteries. Bilateral vertebral arteries demonstrate normal antegrade flow. Oda Cogan, BS, RDMS, RVT

## 2015-04-10 NOTE — Evaluation (Signed)
Physical Therapy Evaluation Patient Details Name: Thomas Robinson MRN: [id number] DOB: 1961/06/27 Today's Date: 04/10/2015   History of Present Illness  54 yo who worked at [employer] who presented with L sided weakness and sensory changes. CT+CT brain was reviewed and showed two separate areas of asymmetric hypoattenuation as described in the RIGHT thalamus and RIGHT centrum semiovale concerning for acute ischemia.  Clinical Impression  Patient demonstrates deficits in functional mobility as indicated below. Will need continued skilled PT to address deficits and maximize function. Will see as indicated and progress as tolerated. 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 Patient with significant deficits in balance and mobility with history of 2 falls over past wk. Given current impairments and potential for recovery feel patient would need continued comprehensive therapies CIR upon acute discharge to return safely to modified independent level.     Follow Up Recommendations CIR;Supervision/Assistance - 24 hour    Equipment Recommendations  Other (comment) (tbd)    Recommendations for Other Services Rehab consult     Precautions / Restrictions Precautions Precautions: Fall Restrictions Weight Bearing Restrictions: No      Mobility  Bed Mobility               General bed mobility comments: recevied in chair  Transfers Overall transfer level: Needs assistance Equipment used: None Transfers: Sit to/from Stand;Stand Pivot Transfers Sit to Stand: Min guard Stand pivot transfers: Min guard       General transfer comment: Min guard for inital stability, no physical assist required  Ambulation/Gait Ambulation/Gait assistance: Mod assist Ambulation Distance (Feet): 90 Feet (x2) Assistive device: 1 person hand held assist Gait Pattern/deviations: Decreased step length - left;Decreased stride length;Decreased dorsiflexion -  left;Ataxic;Scissoring;Step-through pattern Gait velocity: decreased Gait velocity interpretation: Below normal speed for age/gender General Gait Details: patient min assist initially with multiple LOB and ability to self correct during straight ambulation, when attempting to perform basic dynamic activities including head turns patient with significant instability requiring increased moderate assist to prevent falls and maintain upright  Stairs            Wheelchair Mobility    Modified Rankin (Stroke Patients Only) Modified Rankin (Stroke Patients Only) Pre-Morbid Rankin Score: No symptoms Modified Rankin: Moderately severe disability     Balance Overall balance assessment: History of Falls (States he fell 2 x over the weekend)                               Standardized Balance Assessment Standardized Balance Assessment : Dynamic Gait Index   Dynamic Gait Index Level Surface: Mild Impairment Change in Gait Speed: Moderate Impairment Gait with Horizontal Head Turns: Severe Impairment Gait with Vertical Head Turns: Severe Impairment Gait and Pivot Turn: Moderate Impairment Step Over Obstacle: Moderate Impairment Step Around Obstacles: Moderate Impairment       Pertinent Vitals/Pain Pain Assessment: Faces Faces Pain Scale: Hurts little more Pain Location: L shoulder Pain Descriptors / Indicators: Sharp Pain Intervention(s): Limited activity within patient's tolerance    Home Living Family/patient expects to be discharged to:: Private residence Living Arrangements: Other (Comment) (friend) Available Help at Discharge: Friend(s) Type of Home: House Home Access: Stairs to enter Entrance Stairs-Rails: None Entrance Stairs-Number of Steps: 1 Home Layout: One level Home Equipment: None      Prior Function Level of Independence: Independent  Comments: works at a [employer], drives cars into [place]: Right    Extremity/Trunk Assessment   Upper Extremity Assessment: LUE deficits/detail       LUE Deficits / Details: generalized weakness. Pt has isolated movement patterns. greater strength proximally, however, pt c/o pain with shoulder flex/ER at @ 80 . Pt did state he fell on this shoulder x 2 over the weekend. Brunstrom satge IV arm (movement deviating from synergy) and stage II ( little or no active finger flexion). Pt attempts to use LUE during ADL at times.    Lower Extremity Assessment: modest strength deficits noted LLE 4-/5 compared to RLE, increased LE edema L>R (some pitting). Deficits in gross and fine motor coordination LLE      Cervical / Trunk Assessment: Other exceptions  Communication   Communication: No difficulties  Cognition Arousal/Alertness: Awake/alert Behavior During Therapy: Flat affect;Impulsive Overall Cognitive Status: Impaired/Different from baseline Area of Impairment: Attention;Safety/judgement;Awareness   Current Attention Level: Selective Memory: Decreased recall of precautions   Safety/Judgement: Decreased awareness of safety;Decreased awareness of deficits Awareness: Emergent   General Comments: Pt states he began to have symptoms @ 2 weeks ago. ? unsure of baseline status. appears easily distracted at times.. will further assess.    General Comments      Exercises        Assessment/Plan    PT Assessment Patient needs continued PT services  PT Diagnosis Abnormality of gait;Difficulty walking   PT Problem List Decreased strength;Decreased activity tolerance;Decreased balance;Decreased mobility;Decreased coordination;Decreased safety awareness  PT Treatment Interventions DME instruction;Gait training;Stair training;Functional mobility training;Therapeutic activities;Therapeutic exercise;Balance training;Patient/family education   PT Goals (Current goals can be found in the Care Plan section) Acute Rehab PT Goals Patient Stated  Goal: To be able to return to work PT Goal Formulation: With patient Time For Goal Achievement: 04/24/15 Potential to Achieve Goals: Good    Frequency Min 3X/week   Barriers to discharge        Co-evaluation PT/OT/SLP Co-Evaluation/Treatment: Yes Reason for Co-Treatment: Complexity of the patient's impairments (multi-system involvement) PT goals addressed during session: Mobility/safety with mobility OT goals addressed during session: ADL's and self-care;Strengthening/ROM       End of Session Equipment Utilized During Treatment: Gait belt Activity Tolerance: Patient tolerated treatment well Patient left: in chair;with call bell/phone within reach;with chair alarm set Nurse Communication: Mobility status         Time: [id number] PT Time Calculation (min) (ACUTE ONLY): 30 min   Charges:   PT Evaluation $Initial PT Evaluation Tier I: 1 Procedure PT Treatments $Gait Training: 8-22 mins   PT G CodesDuncan Dull Apr 24, 2015, 5:41 PM Alben Deeds, [place] DPT  [phone]

## 2015-04-10 NOTE — Telephone Encounter (Signed)
Call received from [facility], [place] requesting that the patient be assessed for the TCC program.   After review, this patient does not meet TCC criteria but an appointment was scheduled for a hospital follow up appointment on 04/17/15 2 1130. C. Robarge, [place] notified of the appointment date/time.

## 2015-04-10 NOTE — Consult Note (Signed)
Thomas Robinson is an 54 y.o. male referred by Dr Eliseo Squires   Chief Complaint: acute vs CKD HPI: 54yo white male brought to ER with lt sided facial droop and lt sided weakness.  Found to have SBP of 190-200. Scr 5.1 on admission with last available Scr 1.67 in 03/2013.  Renal US shows smallish echogenic kidneys Rt 9.4cm, Lt 8.5cm.  Ua 2014 showed 300mg  protein.  Echo shows moderate hypertrophy and good EF.  He has know for a few years that he had HTN but has not followed up with a doctor as he does not have insurance.  + edema x 2 weeks.  One episode gross hematuria after a fall few years ago but none since, no stones, no family hx renal ds.  He was on no antiHTN PTA.  Denies drug use.  Past Medical History  Diagnosis Date  . Hypertension   . Asthma     History reviewed. No pertinent past surgical history.  History reviewed. No pertinent family history. Social History:  reports that he has never smoked. He does not have any smokeless tobacco history on file. He reports that he drinks alcohol. He reports that he does not use illicit drugs. Lives with a "friend".  Works for [employer].  Allergies: No Known Allergies  Medications Prior to Admission  Medication Sig Dispense Refill  . acetaminophen (TYLENOL) 500 MG tablet Take 1,000 mg by mouth every 4 (four) hours as needed for mild pain or headache.    . albuterol (PROVENTIL HFA;VENTOLIN HFA) 108 (90 BASE) MCG/ACT inhaler Inhale 1-2 puffs into the lungs every 6 (six) hours as needed for wheezing or shortness of breath.    . Cyanocobalamin (VITAMIN B 12 PO) Take 1 tablet by mouth daily.    . DiphenhydrAMINE HCl (ZZZQUIL) 50 MG/30ML LIQD Take 30 mLs by mouth at bedtime as needed (for sleep).    [name] ePHEDrine-GuaiFENesin (PRIMATENE ASTHMA) 12.5-200 MG TABS Take 2 tablets by mouth 2 (two) times daily.    . Fluticasone-Salmeterol (ADVAIR) 100-50 MCG/DOSE AEPB Inhale 1 puff into the lungs daily as needed (for shortness of breath).    . Homeopathic Products  ([employer] LEG CRAMPS PAIN RELIEF) TABS Take 2 tablets by mouth at bedtime as needed (for pain).    . Iron TABS Take 1 tablet by mouth daily.    . Multiple Vitamin (MULTIVITAMIN WITH MINERALS) TABS tablet Take 1 tablet by mouth daily.    . Multiple Vitamins-Minerals (EYE SUPPORT) TABS Take 1 tablet by mouth daily.    . naproxen sodium (ANAPROX) 220 MG tablet Take 440 mg by mouth 2 (two) times daily as needed (for pain).    . Omega-3 Fatty Acids (FISH OIL PO) Take 1 capsule by mouth daily.    [name] VITAMIN E PO Take 1 tablet by mouth daily.    [name] lisinopril (PRINIVIL,ZESTRIL) 10 MG tablet Take 1 tablet (10 mg total) by mouth daily. (Patient not taking: Reported on 04/09/2015) 30 tablet 1     Lab Results: UA: 100mg  protein, 0-2 wbc, no rbc   Recent Labs  04/09/15 1320 04/09/15 1330 04/09/15 2305 04/10/15 0950  WBC 4.5  --  4.8 3.7*  HGB 10.3* 11.2* 10.2* 9.8*  HCT 32.1* 33.0* 30.8* 29.7*  PLT 163  --  158 168   BMET  Recent Labs  04/09/15 1320 04/09/15 1330 04/09/15 2305 04/10/15 0950  NA 138 138  --  139  K 3.4* 3.4*  --  3.4*  CL 105 106  --  106  CO2 21*  --   --  23  GLUCOSE 100* 94  --  179*  BUN 53* 50*  --  48*  CREATININE 5.13* 5.20* 4.99* 5.01*  CALCIUM 9.1  --   --  8.5*   LFT  Recent Labs  04/10/15 0950  PROT 5.1*  ALBUMIN 2.8*  AST 19  ALT 22  ALKPHOS 57  BILITOT 0.9   Ct Head Wo Contrast  04/09/2015   CLINICAL DATA:  LEFT-sided facial droop and LEFT-sided weakness noted at approximately 0815 hours earlier today. Initial encounter.  EXAM: CT HEAD WITHOUT CONTRAST  TECHNIQUE: Contiguous axial images were obtained from the base of the skull through the vertex without intravenous contrast.  COMPARISON:  None.  FINDINGS: There is an asymmetric area of hypoattenuation RIGHT centrum semiovale (see for instance image 15) which could represent an acute infarct. Correlate clinically. If no contraindications, MRI could confirm or exclude.  Similarly, there is a 1 cm sized  area of asymmetric hypoattenuation in the RIGHT anterolateral thalamus as seen on image 12. Correlate clinically for LEFT-sided numbness. Confirm or exclude with MRI.  No hemorrhage, mass lesion, hydrocephalus, or extra-axial fluid.  Normal for age cerebral volume. Moderately advanced and likely premature hypoattenuation of the periventricular and subcortical white matter, consistent with chronic microvascular ischemic change. Moderate vascular calcification in the carotid siphons.  Calvarium is intact.  There is no sinus or mastoid disease.  IMPRESSION: Two separate areas of asymmetric hypoattenuation as described in the RIGHT thalamus and RIGHT centrum semiovale. In the appropriate clinical setting, these may represent acute bland ischemia. See discussion above.  Suspected small vessel disease of the supratentorial white matter.   Electronically Signed   By: Staci Righter M.D.   On: 04/09/2015 14:55   Mr Virgel Paling Wo Contrast  04/09/2015   CLINICAL DATA:  54 year old male with left facial droop and left side weakness since Saturday. Recent hypertensive emergency with systolic blood pressure up to 200. Initial encounter.  EXAM: MRI HEAD WITHOUT CONTRAST  MRA HEAD WITHOUT CONTRAST  TECHNIQUE: Multiplanar, multiecho pulse sequences of the brain and surrounding structures were obtained without intravenous contrast. Angiographic images of the head were obtained using MRA technique without contrast.  COMPARISON:  Head CT without contrast 1443 hr today.  FINDINGS: MRI HEAD FINDINGS  12 mm area of restricted diffusion in the posterior right corona radiata tracking inferiorly toward the right posterior external capsule. Mild T2 and FLAIR hyperintensity. No associated hemorrhage or mass effect.  No other restricted diffusion. Major intracranial vascular flow voids are preserved.  No midline shift, mass effect, evidence of mass lesion, ventriculomegaly, extra-axial collection or acute intracranial hemorrhage.  Cervicomedullary junction and pituitary are within normal limits. Patchy and confluent bilateral cerebral white matter T2 and FLAIR hyperintensity. Small chronic lacunar infarcts in the bilateral deep gray matter nuclei, sparing the right basal ganglia. Patchy T2 hyperintensity in the brainstem, and involving the right cerebellar peduncle. There is also some patchy left cerebellar hemisphere signal abnormality (series 8, image 5). No cortical encephalomalacia. No definite chronic cerebral blood products identified.  Grossly negative visualized cervical spine. Normal bone marrow signal. Visible internal auditory structures appear normal. Mastoids are clear. Mild maxillary sinus mucosal thickening. Orbits soft tissues appear normal negative scalp soft tissues.  MRA HEAD FINDINGS  Antegrade flow in the posterior circulation. Mildly dominant distal left vertebral artery. Normal right PICA origin. Dominant appearing left AICA. Patent vertebrobasilar junction. No basilar artery stenosis. SCA and PCA origins are  normal. Diminutive posterior communicating arteries. Moderate irregularity and stenosis of the left PCA P2 segment. Mild to moderate irregularity of the distal bilateral PCA branches.  Antegrade flow in both ICA siphons. No siphon stenosis. Ophthalmic and posterior communicating artery origins are within normal limits. Patent carotid termini. Normal MCA and ACA origins. Anterior communicating artery and visualized bilateral ACA branches are within normal limits (somewhat motion degraded).  Mild irregularity of the left MCA M1 segment and visualized left MCA branches. Mild motion degradation. Similar motion affecting the right MCA branches. The right M1 segment is within normal limits. The right MCA bifurcation is patent. No major right MCA branch occlusion is identified.  IMPRESSION: 1. Small acute lacunar infarct of the right corroborate a cauda. No mass effect or hemorrhage. 2. Intracranial atherosclerosis most  pronounced in the PCA branches. No right MCA branch stenosis or occlusion identified to correspond to #1. 3. Additional signal abnormality in the brain may also reflect chronic small vessel disease. However, cerebellar involvement, including right cerebellar peduncle involvement, is noted and can also be seen with chronic demyelinating disease.   Electronically Signed   By: Genevie Ann M.D.   On: 04/09/2015 19:53   Mr Brain Wo Contrast  04/09/2015   CLINICAL DATA:  54 year old male with left facial droop and left side weakness since Saturday. Recent hypertensive emergency with systolic blood pressure up to 200. Initial encounter.  EXAM: MRI HEAD WITHOUT CONTRAST  MRA HEAD WITHOUT CONTRAST  TECHNIQUE: Multiplanar, multiecho pulse sequences of the brain and surrounding structures were obtained without intravenous contrast. Angiographic images of the head were obtained using MRA technique without contrast.  COMPARISON:  Head CT without contrast 1443 hr today.  FINDINGS: MRI HEAD FINDINGS  12 mm area of restricted diffusion in the posterior right corona radiata tracking inferiorly toward the right posterior external capsule. Mild T2 and FLAIR hyperintensity. No associated hemorrhage or mass effect.  No other restricted diffusion. Major intracranial vascular flow voids are preserved.  No midline shift, mass effect, evidence of mass lesion, ventriculomegaly, extra-axial collection or acute intracranial hemorrhage. Cervicomedullary junction and pituitary are within normal limits. Patchy and confluent bilateral cerebral white matter T2 and FLAIR hyperintensity. Small chronic lacunar infarcts in the bilateral deep gray matter nuclei, sparing the right basal ganglia. Patchy T2 hyperintensity in the brainstem, and involving the right cerebellar peduncle. There is also some patchy left cerebellar hemisphere signal abnormality (series 8, image 5). No cortical encephalomalacia. No definite chronic cerebral blood products  identified.  Grossly negative visualized cervical spine. Normal bone marrow signal. Visible internal auditory structures appear normal. Mastoids are clear. Mild maxillary sinus mucosal thickening. Orbits soft tissues appear normal negative scalp soft tissues.  MRA HEAD FINDINGS  Antegrade flow in the posterior circulation. Mildly dominant distal left vertebral artery. Normal right PICA origin. Dominant appearing left AICA. Patent vertebrobasilar junction. No basilar artery stenosis. SCA and PCA origins are normal. Diminutive posterior communicating arteries. Moderate irregularity and stenosis of the left PCA P2 segment. Mild to moderate irregularity of the distal bilateral PCA branches.  Antegrade flow in both ICA siphons. No siphon stenosis. Ophthalmic and posterior communicating artery origins are within normal limits. Patent carotid termini. Normal MCA and ACA origins. Anterior communicating artery and visualized bilateral ACA branches are within normal limits (somewhat motion degraded).  Mild irregularity of the left MCA M1 segment and visualized left MCA branches. Mild motion degradation. Similar motion affecting the right MCA branches. The right M1 segment is within normal limits. The right  MCA bifurcation is patent. No major right MCA branch occlusion is identified.  IMPRESSION: 1. Small acute lacunar infarct of the right corroborate a cauda. No mass effect or hemorrhage. 2. Intracranial atherosclerosis most pronounced in the PCA branches. No right MCA branch stenosis or occlusion identified to correspond to #1. 3. Additional signal abnormality in the brain may also reflect chronic small vessel disease. However, cerebellar involvement, including right cerebellar peduncle involvement, is noted and can also be seen with chronic demyelinating disease.   Electronically Signed   By: Genevie Ann M.D.   On: 04/09/2015 19:53   US Renal  04/09/2015   ADDENDUM REPORT: 04/09/2015 17:39  ADDENDUM: Not mentioned above are  bilateral pleural effusions.   Electronically Signed   By: Kathreen Devoid   On: 04/09/2015 17:39   04/09/2015   CLINICAL DATA:  Acute kidney injury.  EXAM: RENAL / URINARY TRACT ULTRASOUND COMPLETE  COMPARISON:  None.  FINDINGS: Right Kidney:  Length: 9.4 cm. Increased renal cortical echogenicity. No hydronephrosis. 6 x 5 x 5 mm anechoic right renal mass consistent with a small cyst.  Left Kidney:  Length: 8.5 cm. Increased renal cortical echogenicity. No mass or hydronephrosis visualized.  Bladder:  Appears normal for degree of bladder distention.  IMPRESSION: 1. No obstructive uropathy. 2. Increased renal cortical echogenicity as can be seen with medical renal disease.  Electronically Signed: By: Kathreen Devoid On: 04/09/2015 16:17   Dg Chest Port 1 View  04/09/2015   CLINICAL DATA:  Weakness, unable to move left arm. Sudden onset today. Left lower extremity swelling.  EXAM: PORTABLE CHEST - 1 VIEW  COMPARISON:  None.  FINDINGS: The lower lungs are excluded from the field of view limiting evaluation.  There is bilateral mild interstitial thickening. There is no focal parenchymal opacity. There is no pleural effusion or pneumothorax. The heart and mediastinal contours are unremarkable.  The osseous structures are unremarkable.  IMPRESSION: Mild bilateral interstitial thickening which may reflect interstitial infection versus interstitial edema.   Electronically Signed   By: Kathreen Devoid   On: 04/09/2015 17:43    ROS: No change in vision No SOB No CP No ABD pain No dysuria or change in UO + edema No arthritic CO Sxs of restless legs No rash Appetite decreased some but no N/V  PHYSICAL EXAM: Blood pressure 160/84, pulse 81, temperature 98.1 F (36.7 C), temperature source Oral, resp. rate 20, height 5\' 7"  (1.702 m), weight 58.96 kg (129 lb 15.7 oz), SpO2 97 %. HEENT: PERRLA EOMI NECK:No JVD LUNGS:clear CARDIAC:RRR with S4 gallop ABD:+ BS NTND No HSM  No bruits EXT:1+ edema NEURO:Lt facial droop,  decreased strength Lt arm 3-4/5 Oxs No asterixis  Assessment: 1. CKD 4 most likely sec to HTN.  Whether there is an acute component only time will tell 2. HTN 3. Lacunar stroke 4. anemia PLAN: 1. Check PO4, PTH 2. SPEP, HIV as you have ordered.   3. Pr/Cr 4. Dietician to see 5. Renal diet 6. Start lasix 80mg  bid 7. Iron studies 8. Control BP to SBP 140-150.  Add PRN clonidine 9. Daily Scr    Joao Mccurdy T 04/10/2015, 12:53 PM

## 2015-04-10 NOTE — Care Management Note (Signed)
Case Management Note  Patient Details  Name: Thomas Robinson MRN: [id number] Date of Birth: [birth year]  Subjective/Objective:                    Action/Plan:  Met with patient and friend to discuss discharge planning. Patient DOES NOT have a PCP and is admitted as self pay. He is being followed by [name] in [employer].  Patient lives with his friend, who is very concerned about his medical follow-up and lack of PCP. Patient expressed interest in being established with a physician.  With patient's permission, CM spoke with [name] at the [facility] to have patient evaluated for that program. CM will continue to follow and assess additional needs for discharge. Expected Discharge Date:                  Expected Discharge Plan:   (patient lives at home with friend)  In-House Referral:  Development worker, community  Discharge [facility]  Post Acute Care Choice:    Choice offered to:     DME Arranged:    DME Agency:     HH Arranged:    [place] Agency:     Status of Service:  In process, will continue to follow  Medicare Important Message Given:    Date Medicare IM Given:    Medicare IM give by:    Date Additional Medicare IM Given:    Additional Medicare Important Message give by:     If discussed at [place] of Stay Meetings, dates discussed:    Additional Comments:  [name], RN 04/10/2015, 11:15 AM

## 2015-04-10 NOTE — Progress Notes (Signed)
Initial Nutrition Assessment  DOCUMENTATION CODES:   Non-severe (moderate) malnutrition in context of chronic illness  INTERVENTION:  Ensure Enlive po BID, each supplement provides 350 kcal and 20 grams of protein Provide and discussed "Heart Healthy Eating Nutrition Therapy" handout from the Academy of [employer] Dietetics   NUTRITION DIAGNOSIS:   Malnutrition related to poor appetite as evidenced by moderate depletions of muscle mass, percent weight loss.   GOAL:   Patient will meet greater than or equal to 90% of their needs   MONITOR:   PO intake, Supplement acceptance, Labs, Weight trends, Skin  REASON FOR ASSESSMENT:   Malnutrition Screening Tool    ASSESSMENT:   54 y.o. male, right handed, with a past medical history significant for untreated HTN for many years, and asthma, comes in for evaluation of lower extremity swelling and fatigue for the past week or so, and while being evaluated for such complains he also manifested difficulty using his left side as wel las left face droop.  Pt states that a couple months ago he was weighing his usual weight of 165 lbs. He reports having a poor appetite and eating less over the past couple months with the summer heat. He knew he had lost weight but, didn't realize how much. He reports eating healthy with 3 meals daily. He denies any nausea, vomiting, or abdominal pain. Per pt's report, he has lost 12% of his body weight in less than 3 months; moderate muscle wasting per physical exam- pt meets criteria for moderate malnutrition.   Diet Order:  Diet Heart Room service appropriate?: Yes; Fluid consistency:: Thin  Skin:  Reviewed, no issues  Last BM:  PTA  Height:   Ht Readings from Last 1 Encounters:  04/09/15 5\' 7"  (1.702 m)    Weight:   Wt Readings from Last 1 Encounters:  04/09/15 129 lb 15.7 oz (58.96 kg)    Ideal Body Weight:  67.2 kg  BMI:  Body mass index is 20.35 kg/(m^2).  Estimated Nutritional Needs:    Kcal:  1750-2000  Protein:  70-80 grams  Fluid:  1.7-2 L/day  EDUCATION NEEDS:   No education needs identified at this time  [place], LDN Inpatient Clinical Dietitian Pager: [phone] After Hours Pager: [phone]

## 2015-04-10 NOTE — Progress Notes (Signed)
STROKE TEAM PROGRESS NOTE   HISTORY Thomas Robinson is an 54 y.o. male, right handed, with a past medical history significant for untreated HTN for many years, and asthma, comes in for evaluation of lower extremity swelling and fatigue for the past week or so, and while being evaluated for such complains he also manifested difficulty using his left side as wel las left face droop. Patient stated that this past Saturday 04/07/2015 he went to the gym and noticed some limitation using his left arm which was "kind of clumsy". The clumsiness/heaviness of the left arm did not get any better and subsequently he started having problems with balance and left leg heaviness which had caused him to fall x 3. This morning 04/08/2015 his coworkers noted that his left face was droopier than the right. Denies associated HA, vertigo, double vision, difficulty swallowing, slurred speech, language or vision impairment. Upon arrival to the ED noted to have BP 243/128 mmHg. Creatinine is 5.2, last creatinine was only mildly elevated in 2014 at 1.6. CT brain was reviewed and showed two separate areas of asymmetric hypoattenuation as described in the RIGHT thalamus and RIGHT centrum semiovale concerning for acute ischemia.  Date last known well: unable to determine Time last known well: unable to determine tPA Given: no, late presentation  He was admitted for further evaluation and treatment.   SUBJECTIVE (INTERVAL HISTORY) No family is at the bedside.  Overall he feels his condition is stable.    OBJECTIVE Temp:  [97.5 F (36.4 C)-99 F (37.2 C)] 98.6 F (37 C) (08/09 0800) Pulse Rate:  [76-106] 84 (08/09 0800) Cardiac Rhythm:  [-] Sinus tachycardia (08/09 0800) Resp:  [15-22] 20 (08/09 0800) BP: (156-245)/(74-129) 156/82 mmHg (08/09 0800) SpO2:  [91 %-98 %] 96 % (08/09 0800) Weight:  [58.96 kg (129 lb 15.7 oz)-58.968 kg (130 lb)] 58.96 kg (129 lb 15.7 oz) (08/08 2233)   Recent Labs Lab 04/09/15 1329  GLUCAP  92    Recent Labs Lab 04/09/15 1320 04/09/15 1330 04/09/15 2305  NA 138 138  --   K 3.4* 3.4*  --   CL 105 106  --   CO2 21*  --   --   GLUCOSE 100* 94  --   BUN 53* 50*  --   CREATININE 5.13* 5.20* 4.99*  CALCIUM 9.1  --   --     Recent Labs Lab 04/09/15 1320  AST 23  ALT 26  ALKPHOS 70  BILITOT 1.4*  PROT 6.8  ALBUMIN 3.9    Recent Labs Lab 04/09/15 1320 04/09/15 1330 04/09/15 2305  WBC 4.5  --  4.8  NEUTROABS 3.3  --   --   HGB 10.3* 11.2* 10.2*  HCT 32.1* 33.0* 30.8*  MCV 90.4  --  89.5  PLT 163  --  158   No results for input(s): CKTOTAL, CKMB, CKMBINDEX, TROPONINI in the last 168 hours.  Recent Labs  04/09/15 1320  LABPROT 14.2  INR 1.08    Recent Labs  04/09/15 1527  COLORURINE YELLOW  LABSPEC 1.011  PHURINE 5.5  GLUCOSEU NEGATIVE  HGBUR SMALL*  BILIRUBINUR NEGATIVE  KETONESUR NEGATIVE  PROTEINUR 100*  UROBILINOGEN 0.2  NITRITE NEGATIVE  LEUKOCYTESUR NEGATIVE    No results found for: CHOL, TRIG, HDL, CHOLHDL, VLDL, LDLCALC Lab Results  Component Value Date   HGBA1C 5.1 04/09/2015   No results found for: LABOPIA, COCAINSCRNUR, LABBENZ, AMPHETMU, THCU, LABBARB  No results for input(s): ETH in the last 168 hours.  IMAGING  Ct Head Wo Contrast 04/09/2015    Two separate areas of asymmetric hypoattenuation as described in the RIGHT thalamus and RIGHT centrum semiovale. In the appropriate clinical setting, these may represent acute bland ischemia. See discussion above.  Suspected small vessel disease of the supratentorial white matter.     Mri & Mra Brain Wo Contrast 04/09/2015   1. Small acute lacunar infarct of the right corroborate a cauda. No mass effect or hemorrhage. 2. Intracranial atherosclerosis most pronounced in the PCA branches. No right MCA branch stenosis or occlusion identified to correspond to #1. 3. Additional signal abnormality in the brain may also reflect chronic small vessel disease. However, cerebellar involvement,  including right cerebellar peduncle involvement, is noted and can also be seen with chronic demyelinating disease.     US Renal 04/09/2015   bilateral pleural effusions 1. No obstructive uropathy. 2. Increased renal cortical echogenicity as can be seen with medical renal disease.    Dg Chest Port 1 View 04/09/2015    Mild bilateral interstitial thickening which may reflect interstitial infection versus interstitial edema.       PHYSICAL EXAM Pleasant middle-age Caucasian male currently not in distress. . Afebrile. Head is nontraumatic. Neck is supple without bruit.    Cardiac exam no murmur or gallop. Lungs are clear to auscultation. Distal pulses are well felt. Neurological Exam :  Awake alert oriented x 3 normal speech and language. Mild left lower face asymmetry. Tongue midline. Mild LUE drift. Significant weakness of left grip and intrinsic hand and wrist muscles. The minimum weakness of left hip flexors and ankle dorsiflexors. Mild diminished fine finger movements on left. Orbits right over left upper extremity.   . Normal sensation . Normal coordination. ASSESSMENT/PLAN Thomas Robinson is a 53 y.o. male with history of HTN, asthma, and medical noncompliance presenting with left sided weakness. He did not receive IV t-PA due to delay in arrival.   Stroke:  Non-dominant right corona radiata infarct secondary to small vessel disease    Resultant  Left hemiparesis hand> leg. L facial droop.   MRI  R corona radiata infarct  MRA  PCA atherosclerosis  Carotid Doppler  pending   2D Echo  pending   LDL pending   HgbA1c 5.1  Heparin 5000 units sq tid for VTE prophylaxis Diet Heart Room service appropriate?: Yes; Fluid consistency:: Thin  no consistent aspirin use prior to admission, now on aspirin 81 mg orally every day  Patient counseled to be compliant with his antithrombotic medications  Ongoing aggressive stroke risk factor management  Therapy recommendations:  Pending. Ok to  be OOB. Bedrest discontinued.  Disposition:  pending   Accelerated Hypertension  BP 243/128 as high as on arrival  Not taking Home meds as prescribed  Started on cardene drip, now off  Received 2 doses labetalol over night  Improved, but remains elevated   Permissive hypertension (OK if < 220/120) but gradually normalize in 5-7 days  Patient counseled to be compliant with his blood pressure medications  Other Stroke Risk Factors  ETOH use  Other Active Problems  Acute on chronic kidney disease  Hospital day # [place] for Pager information 04/10/2015 10:03 AM  I have personally examined this patient, reviewed notes, independently viewed imaging studies, participated in medical decision making and plan of care. I have made any additions or clarifications directly to the above note. Agree with note above. He  has presented with.  2-3 day h/o left face and arm weakness due to right brain subcortical infarct from small vessel disease. and  remains at risk for neurological worsening, recurrent stroke/TIAs and needs ongoing stroke evaluation and aggressive risk factor modification. Start aspirin for secondary stroke prevention and patient counseled to be compliant with his blood pressure medications and regular follow-up with primary physician   Antony Contras, [place] Pager: [phone] 04/10/2015 1:11 PM    To contact Stroke Continuity provider, please refer to [email or web address] After hours, contact General Neurology

## 2015-04-10 NOTE — Progress Notes (Signed)
PROGRESS NOTE  Thomas Robinson [id number] DOB: 1960-11-14 DOA: 04/09/2015 PCP: No PCP Per Patient  Assessment/Plan: Acute CVA:  RIGHT thalamus and RIGHT centrum semiovale acute ischemia. neurology consulted will treat if sbp >180 with prn iv lopressor. FLP pending Echo/carotid pending HgbA1C ok  ARF on ckd: with edema/bun 53. ua no infection, +hgb, + protein. Renal US no obstruction.  Baseline cr 1.67 in 2014, progressive renal failure likely secondary to long standing uncontrolled HTN. Does has sign of volume overload, with 2+ pitting edema, cxr pending, does report progressive nocturnal dyspnea, report making good urine spep/upep/hepatitis/hiv.  Nephrology consulted.  HTN emergency: norvasc and BB started, d/c lisinopril  Code Status: full Family Communication: patient Disposition Plan:    Consultants:    Procedures:  nephrology   HPI/Subjective: No SOB, no CP Wants to go home Wife says eats very healthy except pizza  Objective: Filed Vitals:   04/10/15 1019  BP: 160/84  Pulse: 81  Temp: 98.1 F (36.7 C)  Resp: 20    Intake/Output Summary (Last 24 hours) at 04/10/15 1204 Last data filed at 04/10/15 [id number]  Gross per 24 hour  Intake      0 ml  Output   1800 ml  Net  -1800 ml   Filed Weights   04/09/15 2233  Weight: 58.96 kg (129 lb 15.7 oz)    Exam:   General:  A+Ox3, NAd  Cardiovascular: rrr  Respiratory: clear  Abdomen: +BS, soft  Musculoskeletal: +edema  Data Reviewed: Basic Metabolic Panel:  Recent Labs Lab 04/09/15 1320 04/09/15 1330 04/09/15 2305 04/10/15 0950  NA 138 138  --  139  K 3.4* 3.4*  --  3.4*  CL 105 106  --  106  CO2 21*  --   --  23  GLUCOSE 100* 94  --  179*  BUN 53* 50*  --  48*  CREATININE 5.13* 5.20* 4.99* 5.01*  CALCIUM 9.1  --   --  8.5*   Liver Function Tests:  Recent Labs Lab 04/09/15 1320 04/10/15 0950  AST 23 19  ALT 26 22  ALKPHOS 70 57  BILITOT 1.4* 0.9  PROT 6.8 5.1*  ALBUMIN 3.9  2.8*   No results for input(s): LIPASE, AMYLASE in the last 168 hours. No results for input(s): AMMONIA in the last 168 hours. CBC:  Recent Labs Lab 04/09/15 1320 04/09/15 1330 04/09/15 2305 04/10/15 0950  WBC 4.5  --  4.8 3.7*  NEUTROABS 3.3  --   --   --   HGB 10.3* 11.2* 10.2* 9.8*  HCT 32.1* 33.0* 30.8* 29.7*  MCV 90.4  --  89.5 90.3  PLT 163  --  158 168   Cardiac Enzymes: No results for input(s): CKTOTAL, CKMB, CKMBINDEX, TROPONINI in the last 168 hours. BNP (last 3 results) No results for input(s): BNP in the last 8760 hours.  ProBNP (last 3 results) No results for input(s): PROBNP in the last 8760 hours.  CBG:  Recent Labs Lab 04/09/15 1329  GLUCAP 92    No results found for this or any previous visit (from the past 240 hour(s)).   Studies: Ct Head Wo Contrast  04/09/2015   CLINICAL DATA:  LEFT-sided facial droop and LEFT-sided weakness noted at approximately 0815 hours earlier today. Initial encounter.  EXAM: CT HEAD WITHOUT CONTRAST  TECHNIQUE: Contiguous axial images were obtained from the base of the skull through the vertex without intravenous contrast.  COMPARISON:  None.  FINDINGS: There is an asymmetric area of  hypoattenuation RIGHT centrum semiovale (see for instance image 15) which could represent an acute infarct. Correlate clinically. If no contraindications, MRI could confirm or exclude.  Similarly, there is a 1 cm sized area of asymmetric hypoattenuation in the RIGHT anterolateral thalamus as seen on image 12. Correlate clinically for LEFT-sided numbness. Confirm or exclude with MRI.  No hemorrhage, mass lesion, hydrocephalus, or extra-axial fluid.  Normal for age cerebral volume. Moderately advanced and likely premature hypoattenuation of the periventricular and subcortical white matter, consistent with chronic microvascular ischemic change. Moderate vascular calcification in the carotid siphons.  Calvarium is intact.  There is no sinus or mastoid disease.   IMPRESSION: Two separate areas of asymmetric hypoattenuation as described in the RIGHT thalamus and RIGHT centrum semiovale. In the appropriate clinical setting, these may represent acute bland ischemia. See discussion above.  Suspected small vessel disease of the supratentorial white matter.   Electronically Signed   By: Staci Righter M.D.   On: 04/09/2015 14:55   Mr Virgel Paling Wo Contrast  04/09/2015   CLINICAL DATA:  54 year old male with left facial droop and left side weakness since Saturday. Recent hypertensive emergency with systolic blood pressure up to 200. Initial encounter.  EXAM: MRI HEAD WITHOUT CONTRAST  MRA HEAD WITHOUT CONTRAST  TECHNIQUE: Multiplanar, multiecho pulse sequences of the brain and surrounding structures were obtained without intravenous contrast. Angiographic images of the head were obtained using MRA technique without contrast.  COMPARISON:  Head CT without contrast 1443 hr today.  FINDINGS: MRI HEAD FINDINGS  12 mm area of restricted diffusion in the posterior right corona radiata tracking inferiorly toward the right posterior external capsule. Mild T2 and FLAIR hyperintensity. No associated hemorrhage or mass effect.  No other restricted diffusion. Major intracranial vascular flow voids are preserved.  No midline shift, mass effect, evidence of mass lesion, ventriculomegaly, extra-axial collection or acute intracranial hemorrhage. Cervicomedullary junction and pituitary are within normal limits. Patchy and confluent bilateral cerebral white matter T2 and FLAIR hyperintensity. Small chronic lacunar infarcts in the bilateral deep gray matter nuclei, sparing the right basal ganglia. Patchy T2 hyperintensity in the brainstem, and involving the right cerebellar peduncle. There is also some patchy left cerebellar hemisphere signal abnormality (series 8, image 5). No cortical encephalomalacia. No definite chronic cerebral blood products identified.  Grossly negative visualized cervical  spine. Normal bone marrow signal. Visible internal auditory structures appear normal. Mastoids are clear. Mild maxillary sinus mucosal thickening. Orbits soft tissues appear normal negative scalp soft tissues.  MRA HEAD FINDINGS  Antegrade flow in the posterior circulation. Mildly dominant distal left vertebral artery. Normal right PICA origin. Dominant appearing left AICA. Patent vertebrobasilar junction. No basilar artery stenosis. SCA and PCA origins are normal. Diminutive posterior communicating arteries. Moderate irregularity and stenosis of the left PCA P2 segment. Mild to moderate irregularity of the distal bilateral PCA branches.  Antegrade flow in both ICA siphons. No siphon stenosis. Ophthalmic and posterior communicating artery origins are within normal limits. Patent carotid termini. Normal MCA and ACA origins. Anterior communicating artery and visualized bilateral ACA branches are within normal limits (somewhat motion degraded).  Mild irregularity of the left MCA M1 segment and visualized left MCA branches. Mild motion degradation. Similar motion affecting the right MCA branches. The right M1 segment is within normal limits. The right MCA bifurcation is patent. No major right MCA branch occlusion is identified.  IMPRESSION: 1. Small acute lacunar infarct of the right corroborate a cauda. No mass effect or hemorrhage. 2. Intracranial atherosclerosis  most pronounced in the PCA branches. No right MCA branch stenosis or occlusion identified to correspond to #1. 3. Additional signal abnormality in the brain may also reflect chronic small vessel disease. However, cerebellar involvement, including right cerebellar peduncle involvement, is noted and can also be seen with chronic demyelinating disease.   Electronically Signed   By: Genevie Ann M.D.   On: 04/09/2015 19:53   Mr Brain Wo Contrast  04/09/2015   CLINICAL DATA:  54 year old male with left facial droop and left side weakness since Saturday. Recent  hypertensive emergency with systolic blood pressure up to 200. Initial encounter.  EXAM: MRI HEAD WITHOUT CONTRAST  MRA HEAD WITHOUT CONTRAST  TECHNIQUE: Multiplanar, multiecho pulse sequences of the brain and surrounding structures were obtained without intravenous contrast. Angiographic images of the head were obtained using MRA technique without contrast.  COMPARISON:  Head CT without contrast 1443 hr today.  FINDINGS: MRI HEAD FINDINGS  12 mm area of restricted diffusion in the posterior right corona radiata tracking inferiorly toward the right posterior external capsule. Mild T2 and FLAIR hyperintensity. No associated hemorrhage or mass effect.  No other restricted diffusion. Major intracranial vascular flow voids are preserved.  No midline shift, mass effect, evidence of mass lesion, ventriculomegaly, extra-axial collection or acute intracranial hemorrhage. Cervicomedullary junction and pituitary are within normal limits. Patchy and confluent bilateral cerebral white matter T2 and FLAIR hyperintensity. Small chronic lacunar infarcts in the bilateral deep gray matter nuclei, sparing the right basal ganglia. Patchy T2 hyperintensity in the brainstem, and involving the right cerebellar peduncle. There is also some patchy left cerebellar hemisphere signal abnormality (series 8, image 5). No cortical encephalomalacia. No definite chronic cerebral blood products identified.  Grossly negative visualized cervical spine. Normal bone marrow signal. Visible internal auditory structures appear normal. Mastoids are clear. Mild maxillary sinus mucosal thickening. Orbits soft tissues appear normal negative scalp soft tissues.  MRA HEAD FINDINGS  Antegrade flow in the posterior circulation. Mildly dominant distal left vertebral artery. Normal right PICA origin. Dominant appearing left AICA. Patent vertebrobasilar junction. No basilar artery stenosis. SCA and PCA origins are normal. Diminutive posterior communicating arteries.  Moderate irregularity and stenosis of the left PCA P2 segment. Mild to moderate irregularity of the distal bilateral PCA branches.  Antegrade flow in both ICA siphons. No siphon stenosis. Ophthalmic and posterior communicating artery origins are within normal limits. Patent carotid termini. Normal MCA and ACA origins. Anterior communicating artery and visualized bilateral ACA branches are within normal limits (somewhat motion degraded).  Mild irregularity of the left MCA M1 segment and visualized left MCA branches. Mild motion degradation. Similar motion affecting the right MCA branches. The right M1 segment is within normal limits. The right MCA bifurcation is patent. No major right MCA branch occlusion is identified.  IMPRESSION: 1. Small acute lacunar infarct of the right corroborate a cauda. No mass effect or hemorrhage. 2. Intracranial atherosclerosis most pronounced in the PCA branches. No right MCA branch stenosis or occlusion identified to correspond to #1. 3. Additional signal abnormality in the brain may also reflect chronic small vessel disease. However, cerebellar involvement, including right cerebellar peduncle involvement, is noted and can also be seen with chronic demyelinating disease.   Electronically Signed   By: Genevie Ann M.D.   On: 04/09/2015 19:53   US Renal  04/09/2015   ADDENDUM REPORT: 04/09/2015 17:39  ADDENDUM: Not mentioned above are bilateral pleural effusions.   Electronically Signed   By: Kathreen Devoid   On: 04/09/2015  17:39   04/09/2015   CLINICAL DATA:  Acute kidney injury.  EXAM: RENAL / URINARY TRACT ULTRASOUND COMPLETE  COMPARISON:  None.  FINDINGS: Right Kidney:  Length: 9.4 cm. Increased renal cortical echogenicity. No hydronephrosis. 6 x 5 x 5 mm anechoic right renal mass consistent with a small cyst.  Left Kidney:  Length: 8.5 cm. Increased renal cortical echogenicity. No mass or hydronephrosis visualized.  Bladder:  Appears normal for degree of bladder distention.  IMPRESSION:  1. No obstructive uropathy. 2. Increased renal cortical echogenicity as can be seen with medical renal disease.  Electronically Signed: By: Kathreen Devoid On: 04/09/2015 16:17   Dg Chest Port 1 View  04/09/2015   CLINICAL DATA:  Weakness, unable to move left arm. Sudden onset today. Left lower extremity swelling.  EXAM: PORTABLE CHEST - 1 VIEW  COMPARISON:  None.  FINDINGS: The lower lungs are excluded from the field of view limiting evaluation.  There is bilateral mild interstitial thickening. There is no focal parenchymal opacity. There is no pleural effusion or pneumothorax. The heart and mediastinal contours are unremarkable.  The osseous structures are unremarkable.  IMPRESSION: Mild bilateral interstitial thickening which may reflect interstitial infection versus interstitial edema.   Electronically Signed   By: Kathreen Devoid   On: 04/09/2015 17:43    Scheduled Meds: . aspirin  81 mg Oral Daily  . feeding supplement (ENSURE ENLIVE)  237 mL Oral BID BM  . heparin  5,000 Units Subcutaneous 3 times per day  . lisinopril  10 mg Oral Daily   Continuous Infusions:  Antibiotics Given (last 72 hours)    None      Active Problems:   Hypertensive emergency   CVA (cerebral infarction)    Time spent: 25 min    Tarus [place]  [facility] Pager [phone]. If 7PM-7AM, please contact night-coverage at [email or web address], password [facility] 04/10/2015, 12:04 PM  LOS: 1 day

## 2015-04-10 NOTE — Progress Notes (Signed)
Occupational Therapy Evaluation Patient Details Name: ENO DENECKE MRN: [id number] DOB: Jun 26, 1961 Today's Date: 04/10/2015    History of Present Illness 54 yo who worked at [employer] who presented with L sided weakness and sensory changes. CT+CT brain was reviewed and showed two separate areas of asymmetric hypoattenuation as described in the RIGHT thalamus and RIGHT centrum semiovale concerning for acute ischemia.   Clinical Impression   PTA, pt lived with friend and worked at a [employer] and was independent with ADL and mobility. Pt presents with deficits listed below and would benefit from skilled OT services at [facility] to facilitate safe return to home @ mod i level. Pt reports his friend can provide 24/7 S after D/C. Will follow acutely to maximize functional level of independence and facilitate safe D/C to next venue of care.     Follow Up Recommendations  CIR;Supervision/Assistance - 24 hour    Equipment Recommendations  Tub/shower seat    Recommendations for Other Services Rehab consult     Precautions / Restrictions Precautions Precautions: Fall Restrictions Weight Bearing Restrictions: No      Mobility Bed Mobility               General bed mobility comments: recevied in chair  Transfers Overall transfer level: Needs assistance Equipment used: None Transfers: Sit to/from Stand;Stand Pivot Transfers Sit to Stand: Min guard Stand pivot transfers: Min guard       General transfer comment: Min guard for inital stability, no physical assist required    Balance Overall balance assessment: History of Falls (States he fell 2 x over the weekend)        Balance significantly affected by attention. LOB when not attending to walking.                                   ADL Overall ADL's : Needs assistance/impaired     Grooming: Set up   Upper Body Bathing: Minimal assitance Upper Body Bathing Details (indicate cue type and reason):  unable to bath under R arm Lower Body Bathing: Minimal assistance;Sit to/from stand   Upper Body Dressing : Minimal assistance;Cueing for safety   Lower Body Dressing: Minimal assistance;Sit to/from stand   Toilet Transfer: Min guard;Ambulation Toilet Transfer Details (indicate cue type and reason): min A for safety. Toileting- Clothing Manipulation and Hygiene: Min guard       Functional mobility during ADLs: Minimal assistance (unsteady gait. Fell x 2 at home PTA. ) General ADL Comments: Pt with decreased insight/judgement into safety regarding increased risk for falls and problem solving.      Vision Vision Assessment?: No apparent visual deficits   Perception Perception Perception Tested?:  (no apparent deficits)   Praxis Praxis Praxis tested?: Within functional limits    Pertinent Vitals/Pain Pain Assessment: Faces Faces Pain Scale: Hurts little more Pain Location: L shoulder Pain Descriptors / Indicators: Sharp Pain Intervention(s): Limited activity within patient's tolerance     Hand Dominance Right   Extremity/Trunk Assessment Upper Extremity Assessment Upper Extremity Assessment: LUE deficits/detail LUE Deficits / Details: generalized weakness. Pt has isolated movement patterns. greater strength proximally, however, pt c/o pain with shoulder flex/ER at @ 80 . Pt did state he fell on this shoulder x 2 over the weekend. Brunstrom satge IV arm (movement deviating from synergy) and stage II ( little or no active finger flexion). Pt attempts to use LUE during ADL at times.  LUE Sensation:  (impaired - will further assess) LUE Coordination: decreased fine motor;decreased gross motor (Trying to use as functional assist)   Lower Extremity Assessment Lower Extremity Assessment: Defer to PT evaluation   Cervical / Trunk Assessment Cervical / Trunk Assessment: Other exceptions   Communication Communication Communication: No difficulties   Cognition Arousal/Alertness:  Awake/alert Behavior During Therapy: Flat affect;Impulsive Overall Cognitive Status: Impaired/Different from baseline Area of Impairment: Attention;Safety/judgement;Awareness   Current Attention Level: Selective Memory: Decreased recall of precautions   Safety/Judgement: Decreased awareness of safety;Decreased awareness of deficits Awareness: Emergent   General Comments: Pt states he began to have symptoms @ 2 weeks ago. ? unsure of baseline status - pt states he began having stroke symptoms 2 weeks PTA. appears easily distracted at times.. will further assess.    General Comments       Exercises       Shoulder Instructions      Home Living Family/patient expects to be discharged to:: Private residence Living Arrangements: Other (Comment) (friend) Available Help at Discharge: Friend(s) Type of Home: House Home Access: Stairs to enter [employer] of Steps: 1 Entrance Stairs-Rails: None Home Layout: One level     Bathroom Shower/Tub: Tub/shower unit;Door Shower/tub characteristics: Charity fundraiser: Standard Bathroom Accessibility: Yes How Accessible: Accessible via walker Home Equipment: None          Prior Functioning/Environment Level of Independence: Independent        Comments: works at a [employer], drives cars into [employer]    OT Diagnosis: Generalized weakness;Cognitive deficits;Acute pain;Hemiplegia non-dominant side   OT Problem List: Decreased strength;Decreased range of motion;Decreased activity tolerance;Impaired balance (sitting and/or standing);Decreased coordination;Decreased safety awareness;Decreased knowledge of use of DME or AE;Impaired sensation;Impaired tone;Impaired UE functional use;Increased edema (BLE edema)   OT Treatment/Interventions: Self-care/ADL training;Neuromuscular education;Therapeutic exercise;Energy conservation;DME and/or AE instruction;Modalities;Therapeutic activities;Patient/family education;Balance  training    OT Goals(Current goals can be found in the care plan section) Acute Rehab OT Goals Patient Stated Goal: To be able to return to work OT Goal Formulation: With patient Time For Goal Achievement: 04/24/15  OT Frequency: Min 2X/week   Barriers to D/C:            Co-evaluation PT/OT/SLP Co-Evaluation/Treatment: Yes (partial session)     OT goals addressed during session: ADL's and self-care;Strengthening/ROM      End of Session Equipment Utilized During Treatment: Gait belt Nurse Communication: Mobility status  Activity Tolerance: Patient tolerated treatment well Patient left: in chair;with call bell/phone within reach;with chair alarm set   Time: 1519-1550 OT Time Calculation (min): 31 min Charges:    G-Codes:    Ryoma Nofziger,HILLARY May 01, 2015, 4:59 PM   [facility], OTR/L  [phone] 05/01/2015

## 2015-04-11 DIAGNOSIS — G8190 Hemiplegia, unspecified affecting unspecified side: Secondary | ICD-10-CM

## 2015-04-11 DIAGNOSIS — I6330 Cerebral infarction due to thrombosis of unspecified cerebral artery: Secondary | ICD-10-CM

## 2015-04-11 LAB — LIPID PANEL
CHOL/HDL RATIO: 3.6 ratio
Cholesterol: 162 mg/dL (ref 0–200)
HDL: 45 mg/dL (ref >40)
LDL CALC: 92 mg/dL (ref 0–99)
Triglycerides: 125 mg/dL (ref <150)
VLDL: 25 mg/dL (ref 0–40)

## 2015-04-11 LAB — RENAL FUNCTION PANEL
ALBUMIN: 2.8 g/dL — AB (ref 3.5–5.0)
Anion gap: 10 (ref 5–15)
BUN: 52 mg/dL — AB (ref 6–20)
CHLORIDE: 108 mmol/L (ref 101–111)
CO2: 24 mmol/L (ref 22–32)
CREATININE: 4.9 mg/dL — AB (ref 0.61–1.24)
Calcium: 8.4 mg/dL — ABNORMAL LOW (ref 8.9–10.3)
GFR calc Af Amer: 14 mL/min — ABNORMAL LOW (ref >60)
GFR calc non Af Amer: 12 mL/min — ABNORMAL LOW (ref >60)
GLUCOSE: 101 mg/dL — AB (ref 65–99)
Phosphorus: 4.8 mg/dL — ABNORMAL HIGH (ref 2.5–4.6)
Potassium: 3.4 mmol/L — ABNORMAL LOW (ref 3.5–5.1)
Sodium: 142 mmol/L (ref 135–145)

## 2015-04-11 LAB — PROTEIN ELECTRO, RANDOM URINE
ALBUMIN ELP UR: 61.2 %
Alpha-1-Globulin, U: 5.6 %
Alpha-2-Globulin, U: 7.6 %
Beta Globulin, U: 15.6 %
GAMMA GLOBULIN, U: 10 %
Total Protein, Urine: 141.4 mg/dL

## 2015-04-11 MED ORDER — FUROSEMIDE 80 MG PO TABS
80.0000 mg | ORAL_TABLET | Freq: Two times a day (BID) | ORAL | Status: DC
  Administered 2015-04-11 – 2015-04-12 (×3): 80 mg via ORAL
  Filled 2015-04-11 (×3): qty 1

## 2015-04-11 MED ORDER — POTASSIUM CHLORIDE CRYS ER 20 MEQ PO TBCR
40.0000 meq | EXTENDED_RELEASE_TABLET | Freq: Once | ORAL | Status: AC
  Administered 2015-04-11: 40 meq via ORAL
  Filled 2015-04-11: qty 2

## 2015-04-11 MED ORDER — ATORVASTATIN CALCIUM 10 MG PO TABS
10.0000 mg | ORAL_TABLET | Freq: Every day | ORAL | Status: DC
  Administered 2015-04-11 – 2015-04-12 (×2): 10 mg via ORAL
  Filled 2015-04-11 (×2): qty 1

## 2015-04-11 MED ORDER — SODIUM CHLORIDE 0.9 % IV SOLN
510.0000 mg | INTRAVENOUS | Status: DC
  Administered 2015-04-11: 510 mg via INTRAVENOUS
  Filled 2015-04-11 (×2): qty 17

## 2015-04-11 MED ORDER — NEPRO/CARBSTEADY PO LIQD: 237.0000 mL | ORAL | Status: DC

## 2015-04-11 NOTE — Consult Note (Signed)
Physical Medicine and Rehabilitation Consult Reason for Consult: CVA Referring Physician: [facility]   HPI: Thomas Robinson is a 54 y.o. right handed male with history of hypertension and medical noncompliance. Lives with a friend independent prior to admission working at [employer] for the past 6 months. Presented 04/09/2015 with left-sided weakness. Systolic blood pressure [id number]. MRI of the brain showed small acute lacunar infarct without mass effect. MRA of the head with atherosclerotic type changes. Echocardiogram with ejection fraction of 123456 grade 2 diastolic dysfunction. Carotid Dopplers with no ICA stenosis. Patient did not receive TPA. Neurology consulted maintain on aspirin for CVA prophylaxis as well as subcutaneous heparin for DVT prophylaxis. Hospital course with elevated creatinine 5.20 from baseline 1.67 in July 2014. Renal service is consulted. Renal ultrasound with no hydronephrosis. Felt renal insufficiency most likely secondary to hypertension and workup ongoing. Maintain on a regular consistency diet with renal restrictions. Physical therapy evaluation completed 04/10/2015 with recommendations of physical medicine rehabilitation consult.  Patient has been up to the bathroom without somebody holding onto him. He did ambulate with a walker with therapy today. He feels like his left leg is strong but the left arm is still weak.  Review of Systems  Constitutional: Positive for malaise/fatigue. Negative for fever and chills.  Eyes: Negative for blurred vision and double vision.  Respiratory: Negative for cough and shortness of breath.   Cardiovascular: Positive for leg swelling. Negative for chest pain and palpitations.  Gastrointestinal: Positive for nausea and constipation.  Genitourinary: Negative for dysuria and frequency.  Musculoskeletal: Positive for myalgias.  Skin: Negative for rash.  Neurological: Positive for dizziness and weakness. Negative for loss of consciousness  and headaches.   Past Medical History  Diagnosis Date  . Hypertension   . Asthma    History reviewed. No pertinent past surgical history. History reviewed. No pertinent family history. Social History:  reports that he has never smoked. He does not have any smokeless tobacco history on file. He reports that he drinks alcohol. He reports that he does not use illicit drugs. Allergies: No Known Allergies Medications Prior to Admission  Medication Sig Dispense Refill  . acetaminophen (TYLENOL) 500 MG tablet Take 1,000 mg by mouth every 4 (four) hours as needed for mild pain or headache.    . albuterol (PROVENTIL HFA;VENTOLIN HFA) 108 (90 BASE) MCG/ACT inhaler Inhale 1-2 puffs into the lungs every 6 (six) hours as needed for wheezing or shortness of breath.    . Cyanocobalamin (VITAMIN B 12 PO) Take 1 tablet by mouth daily.    . DiphenhydrAMINE HCl (ZZZQUIL) 50 MG/30ML LIQD Take 30 mLs by mouth at bedtime as needed (for sleep).    [name] ePHEDrine-GuaiFENesin (PRIMATENE ASTHMA) 12.5-200 MG TABS Take 2 tablets by mouth 2 (two) times daily.    . Fluticasone-Salmeterol (ADVAIR) 100-50 MCG/DOSE AEPB Inhale 1 puff into the lungs daily as needed (for shortness of breath).    . Homeopathic Products ([employer] LEG CRAMPS PAIN RELIEF) TABS Take 2 tablets by mouth at bedtime as needed (for pain).    . Iron TABS Take 1 tablet by mouth daily.    . Multiple Vitamin (MULTIVITAMIN WITH MINERALS) TABS tablet Take 1 tablet by mouth daily.    . Multiple Vitamins-Minerals (EYE SUPPORT) TABS Take 1 tablet by mouth daily.    . naproxen sodium (ANAPROX) 220 MG tablet Take 440 mg by mouth 2 (two) times daily as needed (for pain).    . Omega-3 Fatty Acids (FISH  OIL PO) Take 1 capsule by mouth daily.    [name] VITAMIN E PO Take 1 tablet by mouth daily.    [name] lisinopril (PRINIVIL,ZESTRIL) 10 MG tablet Take 1 tablet (10 mg total) by mouth daily. (Patient not taking: Reported on 04/09/2015) 30 tablet 1    Home: [place]  expects to be discharged to:: Private residence Living Arrangements: Other (Comment) (friend) Available Help at Discharge: Friend(s) Type of Home: House Home Access: Stairs to enter Technical brewer of Steps: 1 Entrance Stairs-Rails: None Home Layout: One level Bathroom Shower/Tub: Public librarian, Charity fundraiser: Standard Bathroom Accessibility: Yes Home Equipment: None  Functional History: Prior Function Level of Independence: Independent Comments: works at a Agricultural consultant, drives cars into Nurse, learning disability Status:  Mobility: [place] bed mobility comments: recevied in chair Transfers Overall transfer level: Needs assistance Equipment used: None Transfers: Sit to/from Stand, Oliver to Stand: Min guard Stand pivot transfers: Min guard General transfer comment: Min guard for inital stability, no physical assist required Ambulation/Gait Ambulation/Gait assistance: Mod assist Ambulation Distance (Feet): 90 Feet (x2) Assistive device: 1 person hand held assist Gait Pattern/deviations: Decreased step length - left, Decreased stride length, Decreased dorsiflexion - left, Ataxic, Scissoring, Step-through pattern General Gait Details: patient min assist initially with multiple LOB and ability to self correct during straight ambulation, when attempting to perform basic dynamic activities including head turns patient with significant instability requiring increased moderate assist to prevent falls and maintain upright Gait velocity: decreased Gait velocity interpretation: Below normal speed for age/gender    ADL: ADL Overall ADL's : Needs assistance/impaired Grooming: Set up Upper Body Bathing: Minimal assitance Upper Body Bathing Details (indicate cue type and reason): unable to bath under R arm Lower Body Bathing: Minimal assistance, Sit to/from stand Upper Body Dressing : Minimal assistance, Cueing for safety Lower Body Dressing:  Minimal assistance, Sit to/from stand Toilet Transfer: Min guard, Ambulation Toilet Transfer Details (indicate cue type and reason): min A for safety. Toileting- Clothing Manipulation and Hygiene: Min guard Functional mobility during ADLs: Minimal assistance (unsteady gait. Fell x 2 at home PTA. ) General ADL Comments: Pt with decreased insight/judgement into safety regarding increased risk for falls and problem solving.   Cognition: Cognition Overall Cognitive Status: Impaired/Different from baseline Orientation Level: Oriented X4 Cognition Arousal/Alertness: Awake/alert Behavior During Therapy: Flat affect, Impulsive Overall Cognitive Status: Impaired/Different from baseline Area of Impairment: Attention, Safety/judgement, Awareness Current Attention Level: Selective Memory: Decreased recall of precautions Safety/Judgement: Decreased awareness of safety, Decreased awareness of deficits Awareness: Emergent General Comments: Pt states he began to have symptoms @ 2 weeks ago. ? unsure of baseline status. appears easily distracted at times.. will further assess.  Blood pressure 174/89, pulse 70, temperature 98 F (36.7 C), temperature source Oral, resp. rate 16, height 5\' 7"  (1.702 m), weight 58.96 kg (129 lb 15.7 oz), SpO2 100 %. Physical Exam  Constitutional: He is oriented to person, place, and time.  HENT:  Head: Normocephalic.  Eyes: EOM are normal.  Neck: Normal range of motion. Neck supple. No thyromegaly present.  Cardiovascular: Normal rate and regular rhythm.   Respiratory: Effort normal and breath sounds normal. No respiratory distress.  GI: Soft. Bowel sounds are normal. He exhibits no distension.  Neurological: He is alert and oriented to person, place, and time.  Fair awareness of deficits. Follows commands  Skin: Skin is warm and dry.  Motor strength is 2 minus in the left deltoid, biceps, triceps, grip 4+ in the  left hip flexor and knee extensor ankle dorsal flexor  plantar flexion 5/5 in the right deltoid, bicep, triceps, grip, hip flexor, knee extensor, ankle dorsi flexor and plantar flexor Sensation is intact light touch and proprioception in bilateral upper and lower limbs Supervision no assisted device only needed help to advance the IV pole. No toe drag or knee instability with gait  Results for orders placed or performed during the hospital encounter of 04/09/15 (from the past 24 hour(s))  Ferritin     Status: None   Collection Time: 04/10/15  2:38 PM  Result Value Ref Range   Ferritin 38 24 - 336 ng/mL  Iron and TIBC     Status: Abnormal   Collection Time: 04/10/15  2:38 PM  Result Value Ref Range   Iron 33 (L) 45 - 182 ug/dL   TIBC 337 250 - 450 ug/dL   Saturation Ratios 10 (L) 17.9 - 39.5 %   UIBC 304 ug/dL  Phosphorus     Status: None   Collection Time: 04/10/15  2:38 PM  Result Value Ref Range   Phosphorus 4.6 2.5 - 4.6 mg/dL  Protein / creatinine ratio, urine     Status: Abnormal   Collection Time: 04/10/15  2:59 PM  Result Value Ref Range   Creatinine, Urine 84.28 mg/dL   Total Protein, Urine 150 mg/dL   Protein Creatinine Ratio 1.78 (H) 0.00 - 0.15 mg/mg[Cre]  Renal function panel     Status: Abnormal   Collection Time: 04/11/15  5:48 AM  Result Value Ref Range   Sodium 142 135 - 145 mmol/L   Potassium 3.4 (L) 3.5 - 5.1 mmol/L   Chloride 108 101 - 111 mmol/L   CO2 24 22 - 32 mmol/L   Glucose, Bld 101 (H) 65 - 99 mg/dL   BUN 52 (H) 6 - 20 mg/dL   Creatinine, Ser 4.90 (H) 0.61 - 1.24 mg/dL   Calcium 8.4 (L) 8.9 - 10.3 mg/dL   Phosphorus 4.8 (H) 2.5 - 4.6 mg/dL   Albumin 2.8 (L) 3.5 - 5.0 g/dL   GFR calc non Af Amer 12 (L) >60 mL/min   GFR calc Af Amer 14 (L) >60 mL/min   Anion gap 10 5 - 15   Ct Head Wo Contrast  04/09/2015   CLINICAL DATA:  LEFT-sided facial droop and LEFT-sided weakness noted at approximately 0815 hours earlier today. Initial encounter.  EXAM: CT HEAD WITHOUT CONTRAST  TECHNIQUE: Contiguous axial images  were obtained from the base of the skull through the vertex without intravenous contrast.  COMPARISON:  None.  FINDINGS: There is an asymmetric area of hypoattenuation RIGHT centrum semiovale (see for instance image 15) which could represent an acute infarct. Correlate clinically. If no contraindications, MRI could confirm or exclude.  Similarly, there is a 1 cm sized area of asymmetric hypoattenuation in the RIGHT anterolateral thalamus as seen on image 12. Correlate clinically for LEFT-sided numbness. Confirm or exclude with MRI.  No hemorrhage, mass lesion, hydrocephalus, or extra-axial fluid.  Normal for age cerebral volume. Moderately advanced and likely premature hypoattenuation of the periventricular and subcortical white matter, consistent with chronic microvascular ischemic change. Moderate vascular calcification in the carotid siphons.  Calvarium is intact.  There is no sinus or mastoid disease.  IMPRESSION: Two separate areas of asymmetric hypoattenuation as described in the RIGHT thalamus and RIGHT centrum semiovale. In the appropriate clinical setting, these may represent acute bland ischemia. See discussion above.  Suspected small vessel disease of the supratentorial  white matter.   Electronically Signed   By: Staci Righter M.D.   On: 04/09/2015 14:55   Mr Virgel Paling Wo Contrast  04/09/2015   CLINICAL DATA:  54 year old male with left facial droop and left side weakness since Saturday. Recent hypertensive emergency with systolic blood pressure up to 200. Initial encounter.  EXAM: MRI HEAD WITHOUT CONTRAST  MRA HEAD WITHOUT CONTRAST  TECHNIQUE: Multiplanar, multiecho pulse sequences of the brain and surrounding structures were obtained without intravenous contrast. Angiographic images of the head were obtained using MRA technique without contrast.  COMPARISON:  Head CT without contrast 1443 hr today.  FINDINGS: MRI HEAD FINDINGS  12 mm area of restricted diffusion in the posterior right corona radiata  tracking inferiorly toward the right posterior external capsule. Mild T2 and FLAIR hyperintensity. No associated hemorrhage or mass effect.  No other restricted diffusion. Major intracranial vascular flow voids are preserved.  No midline shift, mass effect, evidence of mass lesion, ventriculomegaly, extra-axial collection or acute intracranial hemorrhage. Cervicomedullary junction and pituitary are within normal limits. Patchy and confluent bilateral cerebral white matter T2 and FLAIR hyperintensity. Small chronic lacunar infarcts in the bilateral deep gray matter nuclei, sparing the right basal ganglia. Patchy T2 hyperintensity in the brainstem, and involving the right cerebellar peduncle. There is also some patchy left cerebellar hemisphere signal abnormality (series 8, image 5). No cortical encephalomalacia. No definite chronic cerebral blood products identified.  Grossly negative visualized cervical spine. Normal bone marrow signal. Visible internal auditory structures appear normal. Mastoids are clear. Mild maxillary sinus mucosal thickening. Orbits soft tissues appear normal negative scalp soft tissues.  MRA HEAD FINDINGS  Antegrade flow in the posterior circulation. Mildly dominant distal left vertebral artery. Normal right PICA origin. Dominant appearing left AICA. Patent vertebrobasilar junction. No basilar artery stenosis. SCA and PCA origins are normal. Diminutive posterior communicating arteries. Moderate irregularity and stenosis of the left PCA P2 segment. Mild to moderate irregularity of the distal bilateral PCA branches.  Antegrade flow in both ICA siphons. No siphon stenosis. Ophthalmic and posterior communicating artery origins are within normal limits. Patent carotid termini. Normal MCA and ACA origins. Anterior communicating artery and visualized bilateral ACA branches are within normal limits (somewhat motion degraded).  Mild irregularity of the left MCA M1 segment and visualized left MCA  branches. Mild motion degradation. Similar motion affecting the right MCA branches. The right M1 segment is within normal limits. The right MCA bifurcation is patent. No major right MCA branch occlusion is identified.  IMPRESSION: 1. Small acute lacunar infarct of the right corroborate a cauda. No mass effect or hemorrhage. 2. Intracranial atherosclerosis most pronounced in the PCA branches. No right MCA branch stenosis or occlusion identified to correspond to #1. 3. Additional signal abnormality in the brain may also reflect chronic small vessel disease. However, cerebellar involvement, including right cerebellar peduncle involvement, is noted and can also be seen with chronic demyelinating disease.   Electronically Signed   By: Genevie Ann M.D.   On: 04/09/2015 19:53   Mr Brain Wo Contrast  04/09/2015   CLINICAL DATA:  54 year old male with left facial droop and left side weakness since Saturday. Recent hypertensive emergency with systolic blood pressure up to 200. Initial encounter.  EXAM: MRI HEAD WITHOUT CONTRAST  MRA HEAD WITHOUT CONTRAST  TECHNIQUE: Multiplanar, multiecho pulse sequences of the brain and surrounding structures were obtained without intravenous contrast. Angiographic images of the head were obtained using MRA technique without contrast.  COMPARISON:  Head CT without  contrast 1443 hr today.  FINDINGS: MRI HEAD FINDINGS  12 mm area of restricted diffusion in the posterior right corona radiata tracking inferiorly toward the right posterior external capsule. Mild T2 and FLAIR hyperintensity. No associated hemorrhage or mass effect.  No other restricted diffusion. Major intracranial vascular flow voids are preserved.  No midline shift, mass effect, evidence of mass lesion, ventriculomegaly, extra-axial collection or acute intracranial hemorrhage. Cervicomedullary junction and pituitary are within normal limits. Patchy and confluent bilateral cerebral white matter T2 and FLAIR hyperintensity. Small  chronic lacunar infarcts in the bilateral deep gray matter nuclei, sparing the right basal ganglia. Patchy T2 hyperintensity in the brainstem, and involving the right cerebellar peduncle. There is also some patchy left cerebellar hemisphere signal abnormality (series 8, image 5). No cortical encephalomalacia. No definite chronic cerebral blood products identified.  Grossly negative visualized cervical spine. Normal bone marrow signal. Visible internal auditory structures appear normal. Mastoids are clear. Mild maxillary sinus mucosal thickening. Orbits soft tissues appear normal negative scalp soft tissues.  MRA HEAD FINDINGS  Antegrade flow in the posterior circulation. Mildly dominant distal left vertebral artery. Normal right PICA origin. Dominant appearing left AICA. Patent vertebrobasilar junction. No basilar artery stenosis. SCA and PCA origins are normal. Diminutive posterior communicating arteries. Moderate irregularity and stenosis of the left PCA P2 segment. Mild to moderate irregularity of the distal bilateral PCA branches.  Antegrade flow in both ICA siphons. No siphon stenosis. Ophthalmic and posterior communicating artery origins are within normal limits. Patent carotid termini. Normal MCA and ACA origins. Anterior communicating artery and visualized bilateral ACA branches are within normal limits (somewhat motion degraded).  Mild irregularity of the left MCA M1 segment and visualized left MCA branches. Mild motion degradation. Similar motion affecting the right MCA branches. The right M1 segment is within normal limits. The right MCA bifurcation is patent. No major right MCA branch occlusion is identified.  IMPRESSION: 1. Small acute lacunar infarct of the right corroborate a cauda. No mass effect or hemorrhage. 2. Intracranial atherosclerosis most pronounced in the PCA branches. No right MCA branch stenosis or occlusion identified to correspond to #1. 3. Additional signal abnormality in the brain may  also reflect chronic small vessel disease. However, cerebellar involvement, including right cerebellar peduncle involvement, is noted and can also be seen with chronic demyelinating disease.   Electronically Signed   By: Genevie Ann M.D.   On: 04/09/2015 19:53   US Renal  04/09/2015   ADDENDUM REPORT: 04/09/2015 17:39  ADDENDUM: Not mentioned above are bilateral pleural effusions.   Electronically Signed   By: Kathreen Devoid   On: 04/09/2015 17:39   04/09/2015   CLINICAL DATA:  Acute kidney injury.  EXAM: RENAL / URINARY TRACT ULTRASOUND COMPLETE  COMPARISON:  None.  FINDINGS: Right Kidney:  Length: 9.4 cm. Increased renal cortical echogenicity. No hydronephrosis. 6 x 5 x 5 mm anechoic right renal mass consistent with a small cyst.  Left Kidney:  Length: 8.5 cm. Increased renal cortical echogenicity. No mass or hydronephrosis visualized.  Bladder:  Appears normal for degree of bladder distention.  IMPRESSION: 1. No obstructive uropathy. 2. Increased renal cortical echogenicity as can be seen with medical renal disease.  Electronically Signed: By: Kathreen Devoid On: 04/09/2015 16:17   Dg Chest Port 1 View  04/09/2015   CLINICAL DATA:  Weakness, unable to move left arm. Sudden onset today. Left lower extremity swelling.  EXAM: PORTABLE CHEST - 1 VIEW  COMPARISON:  None.  FINDINGS: The lower  lungs are excluded from the field of view limiting evaluation.  There is bilateral mild interstitial thickening. There is no focal parenchymal opacity. There is no pleural effusion or pneumothorax. The heart and mediastinal contours are unremarkable.  The osseous structures are unremarkable.  IMPRESSION: Mild bilateral interstitial thickening which may reflect interstitial infection versus interstitial edema.   Electronically Signed   By: Kathreen Devoid   On: 04/09/2015 17:43    Assessment/Plan: Diagnosis: Left hemiparesis secondary to right Corona radiata infarct 1. Does the need for close, 24 hr/day medical supervision in concert  with the patient's rehab needs make it unreasonable for this patient to be served in a less intensive setting? No 2. Co-Morbidities requiring supervision/potential complications: Renal failure 3. Due to safety and medication administration, does the patient require 24 hr/day rehab nursing? No 4. Does the patient require coordinated care of a physician, rehab nurse, PT, OT to address physical and functional deficits in the context of the above medical diagnosis(es)? No Addressing deficits in the following areas: balance, endurance, locomotion, strength and toileting 5. Can the patient actively participate in an intensive therapy program of at least 3 hrs of therapy per day at least 5 days per week? Potentially 6. The potential for patient to make measurable gains while on inpatient rehab is not applicable 7. Anticipated functional outcomes upon discharge from inpatient rehab are n/a  with PT, n/a with OT, n/a with SLP. 8. Estimated rehab length of stay to reach the above functional goals is: Not applicable 9. Does the patient have adequate social supports and living environment to accommodate these discharge functional goals? Yes 10. Anticipated D/C setting: Home 11. Anticipated post D/C treatments: [place] therapy 12. Overall Rehab/Functional Prognosis: excellent  RECOMMENDATIONS: This patient's condition is appropriate for continued rehabilitative care in the following setting: [facility] Therapy Patient has agreed to participate in recommended program. Potentially Note that insurance prior authorization may be required for reimbursement for recommended care.  Comment: Patient is too high level for CIR. I ambulated with the patient and he was at a supervision level without an assisted device    04/11/2015

## 2015-04-11 NOTE — Progress Notes (Signed)
Labs not collected (protein electophoresis, hiv antibody, hepatitis panel, lipid panel). Phlebotomy paged. Per phlebotomy, new orders for labs need to be placed. Orders placed. Per lab, orders are on their side now and awaiting to be collected. MD aware.

## 2015-04-11 NOTE — Progress Notes (Signed)
Rehab Admissions Coordinator Note:  Patient was screened by Kiernan Atkerson L for appropriateness for an Inpatient Acute Rehab Consult.  At this time, we are recommending Inpatient Rehab consult. I will contact Dr. Eliseo Squires for rehab consult order. Thanks.  Addysin Porco L 04/11/2015, 10:06 AM  I can be reached at [phone].

## 2015-04-11 NOTE — Progress Notes (Signed)
Nutrition Education Note  RD consulted for Renal Diet Education; 2gm Na, 2gm K, low PO4. Provided "Food Pyramid for Healthy Eating with Kidney Disease" and "Limit or Avoid These Foods" list. Reviewed food groups and provided written recommended serving sizes specifically determined for patient's current nutritional status.   Explained why diet restrictions are needed and provided lists of foods to limit/avoid that are high potassium, sodium, and phosphorus. Provided specific recommendations on safer alternatives of these foods. Strongly encouraged compliance of this diet.   Reviewed patient's usual diet and provided recommendations for changes. Teach back method used. Patient states that he will decrease his intake of whole grains, chocolate, dark sodas, and dark leafy green vegetables. He will continue to follow a no added salt diet. He also plans to drink cranberry juice instead or orange juice.   Expect good compliance.  Body mass index is 20.35 kg/(m^2). Pt meets criteria for Normal weight based on current BMI.  Current diet order is Renal with 1.8 L restriction, patient is consuming approximately 75-100% of meals at this time. Labs and medications reviewed.  RD will change supplement from Ensure to Nepro due to recent significant weight loss. RD contact information provided. RD will continue to monitor and provide support.  Scarlette Ar RD, LDN Inpatient Clinical Dietitian Pager: [phone] After Hours Pager: [phone]

## 2015-04-11 NOTE — Progress Notes (Signed)
S: some improvement in strength lt arm O:BP 174/89 mmHg  Pulse 70  Temp(Src) 98 F (36.7 C) (Oral)  Resp 16  Ht 5\' 7"  (1.702 m)  Wt 58.96 kg (129 lb 15.7 oz)  BMI 20.35 kg/m2  SpO2 100%  Intake/Output Summary (Last 24 hours) at 04/11/15 1126 Last data filed at 04/11/15 0357  Gross per 24 hour  Intake   1100 ml  Output   3525 ml  Net  -2425 ml   Weight change:  [id number] and alert CVS:RRR Resp:clear Abd:+ BS NTND Ext:1+ edema NEURO: Lt facial droop improved.  Ox3 no asterixis   . amLODipine  5 mg Oral Daily  . aspirin  81 mg Oral Daily  . feeding supplement (ENSURE ENLIVE)  237 mL Oral BID BM  . furosemide  80 mg Intravenous BID  . heparin  5,000 Units Subcutaneous 3 times per day  . metoprolol tartrate  25 mg Oral BID   Ct Head Wo Contrast  04/09/2015   CLINICAL DATA:  LEFT-sided facial droop and LEFT-sided weakness noted at approximately 0815 hours earlier today. Initial encounter.  EXAM: CT HEAD WITHOUT CONTRAST  TECHNIQUE: Contiguous axial images were obtained from the base of the skull through the vertex without intravenous contrast.  COMPARISON:  None.  FINDINGS: There is an asymmetric area of hypoattenuation RIGHT centrum semiovale (see for instance image 15) which could represent an acute infarct. Correlate clinically. If no contraindications, MRI could confirm or exclude.  Similarly, there is a 1 cm sized area of asymmetric hypoattenuation in the RIGHT anterolateral thalamus as seen on image 12. Correlate clinically for LEFT-sided numbness. Confirm or exclude with MRI.  No hemorrhage, mass lesion, hydrocephalus, or extra-axial fluid.  Normal for age cerebral volume. Moderately advanced and likely premature hypoattenuation of the periventricular and subcortical white matter, consistent with chronic microvascular ischemic change. Moderate vascular calcification in the carotid siphons.  Calvarium is intact.  There is no sinus or mastoid disease.  IMPRESSION: Two separate areas  of asymmetric hypoattenuation as described in the RIGHT thalamus and RIGHT centrum semiovale. In the appropriate clinical setting, these may represent acute bland ischemia. See discussion above.  Suspected small vessel disease of the supratentorial white matter.   Electronically Signed   By: Staci Righter M.D.   On: 04/09/2015 14:55   Mr Virgel Paling Wo Contrast  04/09/2015   CLINICAL DATA:  54 year old male with left facial droop and left side weakness since Saturday. Recent hypertensive emergency with systolic blood pressure up to 200. Initial encounter.  EXAM: MRI HEAD WITHOUT CONTRAST  MRA HEAD WITHOUT CONTRAST  TECHNIQUE: Multiplanar, multiecho pulse sequences of the brain and surrounding structures were obtained without intravenous contrast. Angiographic images of the head were obtained using MRA technique without contrast.  COMPARISON:  Head CT without contrast 1443 hr today.  FINDINGS: MRI HEAD FINDINGS  12 mm area of restricted diffusion in the posterior right corona radiata tracking inferiorly toward the right posterior external capsule. Mild T2 and FLAIR hyperintensity. No associated hemorrhage or mass effect.  No other restricted diffusion. Major intracranial vascular flow voids are preserved.  No midline shift, mass effect, evidence of mass lesion, ventriculomegaly, extra-axial collection or acute intracranial hemorrhage. Cervicomedullary junction and pituitary are within normal limits. Patchy and confluent bilateral cerebral white matter T2 and FLAIR hyperintensity. Small chronic lacunar infarcts in the bilateral deep gray matter nuclei, sparing the right basal ganglia. Patchy T2 hyperintensity in the brainstem, and involving the right cerebellar peduncle. There is also  some patchy left cerebellar hemisphere signal abnormality (series 8, image 5). No cortical encephalomalacia. No definite chronic cerebral blood products identified.  Grossly negative visualized cervical spine. Normal bone marrow signal.  Visible internal auditory structures appear normal. Mastoids are clear. Mild maxillary sinus mucosal thickening. Orbits soft tissues appear normal negative scalp soft tissues.  MRA HEAD FINDINGS  Antegrade flow in the posterior circulation. Mildly dominant distal left vertebral artery. Normal right PICA origin. Dominant appearing left AICA. Patent vertebrobasilar junction. No basilar artery stenosis. SCA and PCA origins are normal. Diminutive posterior communicating arteries. Moderate irregularity and stenosis of the left PCA P2 segment. Mild to moderate irregularity of the distal bilateral PCA branches.  Antegrade flow in both ICA siphons. No siphon stenosis. Ophthalmic and posterior communicating artery origins are within normal limits. Patent carotid termini. Normal MCA and ACA origins. Anterior communicating artery and visualized bilateral ACA branches are within normal limits (somewhat motion degraded).  Mild irregularity of the left MCA M1 segment and visualized left MCA branches. Mild motion degradation. Similar motion affecting the right MCA branches. The right M1 segment is within normal limits. The right MCA bifurcation is patent. No major right MCA branch occlusion is identified.  IMPRESSION: 1. Small acute lacunar infarct of the right corroborate a cauda. No mass effect or hemorrhage. 2. Intracranial atherosclerosis most pronounced in the PCA branches. No right MCA branch stenosis or occlusion identified to correspond to #1. 3. Additional signal abnormality in the brain may also reflect chronic small vessel disease. However, cerebellar involvement, including right cerebellar peduncle involvement, is noted and can also be seen with chronic demyelinating disease.   Electronically Signed   By: Genevie Ann M.D.   On: 04/09/2015 19:53   Mr Brain Wo Contrast  04/09/2015   CLINICAL DATA:  54 year old male with left facial droop and left side weakness since Saturday. Recent hypertensive emergency with systolic  blood pressure up to 200. Initial encounter.  EXAM: MRI HEAD WITHOUT CONTRAST  MRA HEAD WITHOUT CONTRAST  TECHNIQUE: Multiplanar, multiecho pulse sequences of the brain and surrounding structures were obtained without intravenous contrast. Angiographic images of the head were obtained using MRA technique without contrast.  COMPARISON:  Head CT without contrast 1443 hr today.  FINDINGS: MRI HEAD FINDINGS  12 mm area of restricted diffusion in the posterior right corona radiata tracking inferiorly toward the right posterior external capsule. Mild T2 and FLAIR hyperintensity. No associated hemorrhage or mass effect.  No other restricted diffusion. Major intracranial vascular flow voids are preserved.  No midline shift, mass effect, evidence of mass lesion, ventriculomegaly, extra-axial collection or acute intracranial hemorrhage. Cervicomedullary junction and pituitary are within normal limits. Patchy and confluent bilateral cerebral white matter T2 and FLAIR hyperintensity. Small chronic lacunar infarcts in the bilateral deep gray matter nuclei, sparing the right basal ganglia. Patchy T2 hyperintensity in the brainstem, and involving the right cerebellar peduncle. There is also some patchy left cerebellar hemisphere signal abnormality (series 8, image 5). No cortical encephalomalacia. No definite chronic cerebral blood products identified.  Grossly negative visualized cervical spine. Normal bone marrow signal. Visible internal auditory structures appear normal. Mastoids are clear. Mild maxillary sinus mucosal thickening. Orbits soft tissues appear normal negative scalp soft tissues.  MRA HEAD FINDINGS  Antegrade flow in the posterior circulation. Mildly dominant distal left vertebral artery. Normal right PICA origin. Dominant appearing left AICA. Patent vertebrobasilar junction. No basilar artery stenosis. SCA and PCA origins are normal. Diminutive posterior communicating arteries. Moderate irregularity and stenosis  of  the left PCA P2 segment. Mild to moderate irregularity of the distal bilateral PCA branches.  Antegrade flow in both ICA siphons. No siphon stenosis. Ophthalmic and posterior communicating artery origins are within normal limits. Patent carotid termini. Normal MCA and ACA origins. Anterior communicating artery and visualized bilateral ACA branches are within normal limits (somewhat motion degraded).  Mild irregularity of the left MCA M1 segment and visualized left MCA branches. Mild motion degradation. Similar motion affecting the right MCA branches. The right M1 segment is within normal limits. The right MCA bifurcation is patent. No major right MCA branch occlusion is identified.  IMPRESSION: 1. Small acute lacunar infarct of the right corroborate a cauda. No mass effect or hemorrhage. 2. Intracranial atherosclerosis most pronounced in the PCA branches. No right MCA branch stenosis or occlusion identified to correspond to #1. 3. Additional signal abnormality in the brain may also reflect chronic small vessel disease. However, cerebellar involvement, including right cerebellar peduncle involvement, is noted and can also be seen with chronic demyelinating disease.   Electronically Signed   By: Genevie Ann M.D.   On: 04/09/2015 19:53   US Renal  04/09/2015   ADDENDUM REPORT: 04/09/2015 17:39  ADDENDUM: Not mentioned above are bilateral pleural effusions.   Electronically Signed   By: Kathreen Devoid   On: 04/09/2015 17:39   04/09/2015   CLINICAL DATA:  Acute kidney injury.  EXAM: RENAL / URINARY TRACT ULTRASOUND COMPLETE  COMPARISON:  None.  FINDINGS: Right Kidney:  Length: 9.4 cm. Increased renal cortical echogenicity. No hydronephrosis. 6 x 5 x 5 mm anechoic right renal mass consistent with a small cyst.  Left Kidney:  Length: 8.5 cm. Increased renal cortical echogenicity. No mass or hydronephrosis visualized.  Bladder:  Appears normal for degree of bladder distention.  IMPRESSION: 1. No obstructive uropathy. 2.  Increased renal cortical echogenicity as can be seen with medical renal disease.  Electronically Signed: By: Kathreen Devoid On: 04/09/2015 16:17   Dg Chest Port 1 View  04/09/2015   CLINICAL DATA:  Weakness, unable to move left arm. Sudden onset today. Left lower extremity swelling.  EXAM: PORTABLE CHEST - 1 VIEW  COMPARISON:  None.  FINDINGS: The lower lungs are excluded from the field of view limiting evaluation.  There is bilateral mild interstitial thickening. There is no focal parenchymal opacity. There is no pleural effusion or pneumothorax. The heart and mediastinal contours are unremarkable.  The osseous structures are unremarkable.  IMPRESSION: Mild bilateral interstitial thickening which may reflect interstitial infection versus interstitial edema.   Electronically Signed   By: Kathreen Devoid   On: 04/09/2015 17:43   BMET    Component Value Date/Time   NA 142 04/11/2015 0548   K 3.4* 04/11/2015 0548   CL 108 04/11/2015 0548   CO2 24 04/11/2015 0548   GLUCOSE 101* 04/11/2015 0548   BUN 52* 04/11/2015 0548   CREATININE 4.90* 04/11/2015 0548   CALCIUM 8.4* 04/11/2015 0548   GFRNONAA 12* 04/11/2015 0548   GFRAA 14* 04/11/2015 0548   CBC    Component Value Date/Time   WBC 3.7* 04/10/2015 0950   RBC 3.29* 04/10/2015 0950   HGB 9.8* 04/10/2015 0950   HCT 29.7* 04/10/2015 0950   PLT 168 04/10/2015 0950   MCV 90.3 04/10/2015 0950   MCH 29.8 04/10/2015 0950   MCHC 33.0 04/10/2015 0950   RDW 15.6* 04/10/2015 0950   LYMPHSABS 0.7 04/09/2015 1320   MONOABS 0.4 04/09/2015 1320   EOSABS 0.1 04/09/2015 1320  BASOSABS 0.1 04/09/2015 1320     Assessment:  1. CKD sec HTN 2. Anemia and Fe def 3. Lacunar stroke 4. HTN  Plan: 1. IV iron 2. Daily Scr 3. Change to PO lasix 4. Await other studies   Amiah Frohlich T

## 2015-04-11 NOTE — Progress Notes (Addendum)
Physical Therapy Treatment Patient Details Name: Thomas Robinson MRN: [id number] DOB: [birth year] Today's Date: 04/11/2015    History of Present Illness 54 yo who worked at [employer] who presented with L sided weakness and sensory changes. CT+CT brain was reviewed and showed two separate areas of asymmetric hypoattenuation as described in the RIGHT thalamus and RIGHT centrum semiovale concerning for acute ischemia.    PT Comments    Pt making progress with mobility though continues to demonstrate decreased insight/awareness into deficits and decreased balance with functional mobility. At this time, pt requires up to min A for mobility due to multiple LOB and decreased functional use of LUE. Continue to assess cognition; may benefit from SLP consult for higher level cognition (unclear if some is baseline) if pt continues to demonstrate deficits with awareness, safety, and attention noted today. Pending CIR consult which would be beneficial to pt to increase independence to return to mod I level for d/c home.    Follow Up Recommendations  CIR;Supervision/Assistance - 24 hour     Equipment Recommendations  Other (comment) (TBD)    Recommendations for Other Services       Precautions / Restrictions Precautions Precautions: Fall Restrictions Weight Bearing Restrictions: No    Mobility  Bed Mobility               General bed mobility comments: Pt up in recliner  Transfers Overall transfer level: Needs assistance Equipment used: None Transfers: Sit to/from Stand Sit to Stand: Min guard;Supervision Stand pivot transfers: Min guard       General transfer comment: At times requires steady A for balance during transfers and quick turns  Ambulation/Gait Ambulation/Gait assistance: Min guard;Min assist Ambulation Distance (Feet): 200 Feet Assistive device: None Gait Pattern/deviations: Step-through pattern;Decreased step length - left;Decreased dorsiflexion - left;Decreased  stride length;Staggering left     General Gait Details: Pt requires increased A to catch balance with LOB occurring throughout session especially during dynamic tasks such as quick starts and stops, head turns, or distracted by something/someone in halllway. Pt often would self correct by grabbing onto the rail into the hallway.    Stairs Stairs: Yes Stairs assistance: Min assist Stair Management: One rail Right;No rails Number of Stairs: 6 General stair comments: Pt requires min A for descending stairs or use of rail on R. Cues for which foot to lead with and to slow down at times. Pt does not have rails at home  Wheelchair Mobility    Modified Rankin (Stroke Patients Only)       Balance Overall balance assessment: History of Falls;Needs assistance Sitting-balance support: Single extremity supported;No upper extremity supported;Feet supported Sitting balance-Leahy Scale: Good     Standing balance support: No upper extremity supported;During functional activity Standing balance-Leahy Scale: Fair Standing balance comment: Pt able to bend down to floor on 2 occasions with close S but demonstrates decreased dynamic balance during functional tasks requiring min A to correct             High level balance activites: Direction changes;Turns;Sudden stops;Head turns High Level Balance Comments: steady A for balance during these activities    Cognition Arousal/Alertness: Awake/alert Behavior During Therapy: Impulsive Overall Cognitive Status: Impaired/Different from baseline Area of Impairment: Safety/judgement;Awareness;Attention         Safety/Judgement: Decreased awareness of safety;Decreased awareness of deficits     General Comments: Easily distracted and slightly impulsive at times with mobility. Pt with decreased awareness of deficits functionally. pt reports it was "the girls at  work" who sent him to hospital. He states he wouldn't have came in for these symptoms.  Continue to assess    Exercises      General Comments        Pertinent Vitals/Pain Pain Assessment: Faces Faces Pain Scale: Hurts little more Pain Location: hips and back Pain Descriptors / Indicators: Aching Pain Intervention(s): Monitored during session;Repositioned    Home Living                      Prior Function            PT Goals (current goals can now be found in the care plan section) Acute Rehab PT Goals Patient Stated Goal: none stated PT Goal Formulation: With patient Time For Goal Achievement: 04/24/15 Potential to Achieve Goals: Good Progress towards PT goals: Progressing toward goals    Frequency  Min 3X/week    PT Plan Current plan remains appropriate    Co-evaluation             End of Session   Activity Tolerance: Patient tolerated treatment well Patient left: in chair;with call bell/phone within reach;with chair alarm set     Time: 1040-1058 PT Time Calculation (min) (ACUTE ONLY): 18 min  Charges:  $Therapeutic Activity: 8-22 mins                    G Codes:      Juanna Cao, PT, DPT Pager #: [phone]  04/11/2015, 11:10 AM

## 2015-04-11 NOTE — Progress Notes (Signed)
Occupational Therapy Treatment Patient Details Name: Thomas Robinson MRN: [id number] DOB: 03-02-1961 Today's Date: 04/11/2015    History of present illness 54 yo who worked at [employer] who presented with L sided weakness and sensory changes. CT+CT brain was reviewed and showed two separate areas of asymmetric hypoattenuation as described in the RIGHT thalamus and RIGHT centrum semiovale concerning for acute ischemia.   OT comments  Pt continues to demonstrate progress. Impulsive and poor judgement at times affecting safety with mobility and ADL. Pt demonstrating improved movement with LUE with increase in isolated movement patterns. Demonstrate Brunstrom stage III ( Mass grasp/no release) with hand and IV with arm (isolated movement deviating from synergy). Continue to recommend CIR to facilitate return to MOD I level. Will follow acutely.   Follow Up Recommendations  CIR;Supervision/Assistance - 24 hour    Equipment Recommendations  Tub/shower seat    Recommendations for Other Services Rehab consult    Precautions / Restrictions Precautions Precautions: Fall       Mobility Bed Mobility Overal bed mobility: Needs Assistance Bed Mobility: Supine to Sit;Sit to Supine     Supine to sit: Supervision Sit to supine: Supervision   General bed mobility comments: S for safety and taking care of LUE and not "slinging" arm around  Transfers       Sit to Stand: Min guard              Balance             Standing balance-Leahy Scale: Fair Standing balance comment: balance affected by attention                   ADL Overall ADL's : Needs assistance/impaired                 Upper Body Dressing : Minimal assistance Upper Body Dressing Details (indicate cue type and reason): Began teaching pt hemidressing technique. Pt impulsive adn gown dropping on floor. Pt laning ove and trying to pick up down with affected LUE regardless of cues to use R doinant hand at  this time.                 Functional mobility during ADLs: Minimal assistance General ADL Comments: Impulsivity noted with decreased safety and judgemet during ADL tasks. "scisssors" when walking at times when challenged. Increased difficulty with balance when distracted.                                       Cognition   Behavior During Therapy: Impulsive Overall Cognitive Status: Impaired/Different from baseline Area of Impairment: Attention;Safety/judgement;Awareness   Current Attention Level: Selective Memory: Decreased recall of precautions    Safety/Judgement: Decreased awareness of safety;Decreased awareness of deficits Awareness: Emergent   General Comments: Able to verbalize understanding of imformation but demonstrates difficulty in applying information    Extremity/Trunk Assessment     LUE improving with movemetn patterns. Now brunstrom level III with hand. Demonstrating gross grasp/ trace release, Increased thumb movement today. Able to grasp cup but not release it.           Exercises Other Exercises Other Exercises: SNF D1D2 patterns in supine Other Exercises: scapular strengthening patterns. weaker in scapular depression Other Exercises: gross grasp/release Other Exercises: weight bearing through L hand Other Exercises: towel slides on table with focus on controlled isolated movemetn patterns   Shoulder Instructions  General Comments      Pertinent Vitals/ Pain       Pain Assessment: Faces Pain Location: hips and back Pain Descriptors / Indicators: Aching;Sore Pain Intervention(s): Limited activity within patient's tolerance;Monitored during session  Home Living                                          Prior Functioning/Environment              Frequency Min 2X/week     Progress Toward Goals  OT Goals(current goals can now be found in the care plan section)  Progress towards OT goals:  Progressing toward goals  Acute Rehab OT Goals Patient Stated Goal: to get back to being normal OT Goal Formulation: With patient Time For Goal Achievement: 04/24/15 ADL Goals Pt Will Perform Grooming: with modified independence;standing Pt Will Perform Upper Body Bathing: with modified independence;sitting Pt Will Perform Lower Body Bathing: with modified independence;sit to/from stand Pt Will Perform Upper Body Dressing: with modified independence;sitting Pt Will Perform Lower Body Dressing: with modified independence;sit to/from stand Pt Will Transfer to Toilet: with modified independence;ambulating Pt Will Perform Toileting - Clothing Manipulation and hygiene: with modified independence;sit to/from stand Pt/caregiver will Perform Home Exercise Program: Left upper extremity;With Supervision;With written HEP provided  Plan Discharge plan remains appropriate    Co-evaluation                 End of Session Equipment Utilized During Treatment: Gait belt   Activity Tolerance Patient tolerated treatment well   Patient Left in chair;with call bell/phone within reach;with chair alarm set   Nurse Communication Mobility status        Time: 1430-1500 OT Time Calculation (min): 30 min  Charges: OT General Charges $OT Visit: 1 Procedure OT Treatments $Self Care/Home Management : 8-22 mins $Neuromuscular Re-education: 8-22 mins  [name],HILLARY 04/11/2015, 3:27 PM   [facility], OTR/L  [phone] 04/11/2015

## 2015-04-11 NOTE — Progress Notes (Signed)
STROKE TEAM PROGRESS NOTE   HISTORY Thomas Robinson is an 54 y.o. male, right handed, with a past medical history significant for untreated HTN for many years, and asthma, comes in for evaluation of lower extremity swelling and fatigue for the past week or so, and while being evaluated for such complains he also manifested difficulty using his left side as wel las left face droop. Patient stated that this past Saturday 04/07/2015 he went to the gym and noticed some limitation using his left arm which was "kind of clumsy". The clumsiness/heaviness of the left arm did not get any better and subsequently he started having problems with balance and left leg heaviness which had caused him to fall x 3. This morning 04/08/2015 his coworkers noted that his left face was droopier than the right. Denies associated HA, vertigo, double vision, difficulty swallowing, slurred speech, language or vision impairment. Upon arrival to the ED noted to have BP 243/128 mmHg. Creatinine is 5.2, last creatinine was only mildly elevated in 2014 at 1.6. CT brain was reviewed and showed two separate areas of asymmetric hypoattenuation as described in the RIGHT thalamus and RIGHT centrum semiovale concerning for acute ischemia.  Date last known well: unable to determine Time last known well: unable to determine tPA Given: no, late presentation  He was admitted for further evaluation and treatment.   SUBJECTIVE (INTERVAL HISTORY) No family at bedside. Patient now understands he has medical illnesses that need his attention.    OBJECTIVE Temp:  [97.6 F (36.4 C)-98.3 F (36.8 C)] 97.7 F (36.5 C) (08/10 0652) Pulse Rate:  [69-99] 69 (08/10 0652) Cardiac Rhythm:  [-]  Resp:  [16-20] 18 (08/10 0652) BP: (154-183)/(82-102) 154/82 mmHg (08/10 0759) SpO2:  [97 %-99 %] 99 % (08/10 0652)   Recent Labs Lab 04/09/15 1329  GLUCAP 92    Recent Labs Lab 04/09/15 1320 04/09/15 1330 04/09/15 2305 04/10/15 0950 04/10/15 1438  04/11/15 0548  NA 138 138  --  139  --  142  K 3.4* 3.4*  --  3.4*  --  3.4*  CL 105 106  --  106  --  108  CO2 21*  --   --  23  --  24  GLUCOSE 100* 94  --  179*  --  101*  BUN 53* 50*  --  48*  --  52*  CREATININE 5.13* 5.20* 4.99* 5.01*  --  4.90*  CALCIUM 9.1  --   --  8.5*  --  8.4*  PHOS  --   --   --   --  4.6 4.8*    Recent Labs Lab 04/09/15 1320 04/10/15 0950 04/11/15 0548  AST 23 19  --   ALT 26 22  --   ALKPHOS 70 57  --   BILITOT 1.4* 0.9  --   PROT 6.8 5.1*  --   ALBUMIN 3.9 2.8* 2.8*    Recent Labs Lab 04/09/15 1320 04/09/15 1330 04/09/15 2305 04/10/15 0950  WBC 4.5  --  4.8 3.7*  NEUTROABS 3.3  --   --   --   HGB 10.3* 11.2* 10.2* 9.8*  HCT 32.1* 33.0* 30.8* 29.7*  MCV 90.4  --  89.5 90.3  PLT 163  --  158 168   No results for input(s): CKTOTAL, CKMB, CKMBINDEX, TROPONINI in the last 168 hours.  Recent Labs  04/09/15 1320 04/10/15 0950  LABPROT 14.2 15.0  INR 1.08 1.16    Recent Labs  04/09/15 1527  COLORURINE YELLOW  LABSPEC 1.011  PHURINE 5.5  GLUCOSEU NEGATIVE  HGBUR SMALL*  BILIRUBINUR NEGATIVE  KETONESUR NEGATIVE  PROTEINUR 100*  UROBILINOGEN 0.2  NITRITE NEGATIVE  LEUKOCYTESUR NEGATIVE       Component Value Date/Time   CHOL 162 04/11/2015 0951   TRIG 125 04/11/2015 0951   HDL 45 04/11/2015 0951   CHOLHDL 3.6 04/11/2015 0951   VLDL 25 04/11/2015 0951   LDLCALC 92 04/11/2015 0951   Lab Results  Component Value Date   HGBA1C 5.1 04/09/2015   No results found for: LABOPIA, COCAINSCRNUR, LABBENZ, AMPHETMU, THCU, LABBARB  No results for input(s): ETH in the last 168 hours.  IMAGING  Ct Head Wo Contrast 04/09/2015    Two separate areas of asymmetric hypoattenuation as described in the RIGHT thalamus and RIGHT centrum semiovale. In the appropriate clinical setting, these may represent acute bland ischemia. See discussion above.  Suspected small vessel disease of the supratentorial white matter.     Mri & Mra Brain Wo  Contrast 04/09/2015   1. Small acute lacunar infarct of the right corroborate a cauda. No mass effect or hemorrhage. 2. Intracranial atherosclerosis most pronounced in the PCA branches. No right MCA branch stenosis or occlusion identified to correspond to #1. 3. Additional signal abnormality in the brain may also reflect chronic small vessel disease. However, cerebellar involvement, including right cerebellar peduncle involvement, is noted and can also be seen with chronic demyelinating disease.     US Renal 04/09/2015   bilateral pleural effusions 1. No obstructive uropathy. 2. Increased renal cortical echogenicity as can be seen with medical renal disease.    Dg Chest Port 1 View 04/09/2015    Mild bilateral interstitial thickening which may reflect interstitial infection versus interstitial edema.     Carotid Duplex  Mild intimal thickening with no evidence of a significant stenosis in the right or left carotid arteries. Bilateral vertebral arteries demonstrate normal antegrade flow.  2D Echocardiogram - Left ventricle: The cavity size was normal. There was moderateconcentric hypertrophy. Systolic function was normal. Theestimated ejection fraction was in the range of 55% to 60%. Wallmotion was normal; there were no regional wall motionabnormalities. Features are consistent with a pseudonormal leftventricular filling pattern, with concomitant abnormal relaxationand increased filling pressure (grade 2 diastolic dysfunction).Doppler parameters are consistent with high ventricular filling pressure. - Aortic valve: Moderate thickening and calcification, consistentwith sclerosis. There was moderate regurgitation. - Right ventricle: The cavity size was moderately dilated. Wallthickness was normal. - Pulmonary arteries: PA peak pressure: 38 mm Hg (S). - Pericardium, extracardiac: A small, free-flowing pericardial effusion was identified circumferential to the heart. Impressions: The right  ventricular systolic pressure was increased consistentwith mild pulmonary hypertension.   PHYSICAL EXAM Pleasant middle-age Caucasian male currently not in distress. . Afebrile. Head is nontraumatic. Neck is supple without bruit.    Cardiac exam no murmur or gallop. Lungs are clear to auscultation. Distal pulses are well felt. Neurological Exam :  Awake alert oriented x 3 normal speech and language. Mild left lower face asymmetry. Tongue midline. Mild LUE drift. Significant weakness of left grip and intrinsic hand and wrist muscles. The minimum weakness of left hip flexors and ankle dorsiflexors. Mild diminished fine finger movements on left. Orbits right over left upper extremity.   . Normal sensation . Normal coordination.   ASSESSMENT/PLAN Mr. KHARI SPENCE is a 54 y.o. male with history of HTN, asthma, and medical noncompliance presenting with left sided weakness. He did not receive IV t-PA due to  delay in arrival.   Stroke:  Non-dominant right corona radiata infarct secondary to small vessel disease    Resultant  Left hemiparesis hand> leg. L facial droop.   MRI  R corona radiata infarct  MRA  PCA atherosclerosis  Carotid Doppler  No significant stenosis   2D Echo  No source of embolus. Pulmonary hypertension   LDL 92  HgbA1c 5.1  Heparin 5000 units sq tid for VTE prophylaxis Diet renal with fluid restriction Fluid restriction:: 1800 mL Fluid; Room service appropriate?: Yes; Fluid consistency:: Thin  no consistent aspirin use prior to admission, now on aspirin 81 mg orally every day  Patient counseled to be compliant with his antithrombotic medications  Ongoing aggressive stroke risk factor management  Therapy recommendations:  CIR  Disposition:  pending   Accelerated Hypertension  BP 243/128 as high as on arrival  Not taking Home meds as prescribed  Started on cardene drip, now off  Received 1 dose labetalol this am  Improving   Patient counseled to be  compliant with his blood pressure medications  Hyperlipidemia, LDL 92, on no statin PTA, added lipitor 10, goal LDL < 70. Continue statin at d/c  Other Stroke Risk Factors  ETOH use  Other Active Problems  Acute on chronic kidney disease  NOTHING FURTHER TO ADD FROM THE STROKE STANDPOINT  Patient has a 10-15% risk of having another stroke over the next year, the highest risk is within 2 weeks of the most recent stroke/TIA (risk of having a stroke following a stroke or TIA is the same).  Ongoing risk factor control by Primary Care Physician  Stroke Service will sign off. Please call should any needs arise.  Follow-up Stroke Clinic at [facility] with Dr. Antony Contras in 2 months, order placed.   Hospital day # [place] for Pager information 04/11/2015 9:43 AM  I have personally examined this patient, reviewed notes, independently viewed imaging studies, participated in medical decision making and plan of care. I have made any additions or clarifications directly to the above note. Agree with note above. He has presented with. Left sided weakness due to to right brain subcortical infarct and remains at risk for neurological worsening, recurrent stroke/TIAs and needs ongoing stroke evaluation and aggressive risk factor modification. Start aspirin and statin  Antony Contras, MD Medical Director [facility] Pager: [phone] 04/11/2015 11:30 PM    To contact Stroke Continuity provider, please refer to [email or web address] After hours, contact General Neurology

## 2015-04-11 NOTE — Progress Notes (Signed)
PROGRESS NOTE  Thomas Robinson [id number] DOB: Jul 25, 1961 DOA: 04/09/2015 PCP: No PCP Per Patient  Assessment/Plan: Acute CVA:  RIGHT thalamus and RIGHT centrum semiovale acute ischemia. neurology consulted FLP pending Echo/carotid ok HgbA1C ok  ARF on ckd: with edema/bun 53. ua no infection, +hgb, + protein. Renal US no obstruction.  Baseline cr 1.67 in 2014, progressive renal failure likely secondary to long standing uncontrolled HTN. Does has sign of volume overload, with 2+ pitting edema, cxr pending, does report progressive nocturnal dyspnea, report making good urine Spep/upep/hepatitis/hiv pending Nephrology consulted.  HTN emergency: norvasc and BB started, d/c lisinopril -lasix per nephro  intermittent alcohol use -monitor for withdrawal  Code Status: full Family Communication: patient Disposition Plan:    Consultants:  Nephrology  Neurology      HPI/Subjective: Bored in the hospital  Objective: Filed Vitals:   04/11/15 0759  BP: 154/82  Pulse:   Temp:   Resp:     Intake/Output Summary (Last 24 hours) at 04/11/15 0846 Last data filed at 04/11/15 0357  Gross per 24 hour  Intake   1100 ml  Output   3525 ml  Net  -2425 ml   Filed Weights   04/09/15 2233  Weight: 58.96 kg (129 lb 15.7 oz)    Exam:   General:  A+Ox3, NAd  Cardiovascular: rrr  Respiratory: clear  Abdomen: +BS, soft  Musculoskeletal: decreasing edema  Data Reviewed: Basic Metabolic Panel:  Recent Labs Lab 04/09/15 1320 04/09/15 1330 04/09/15 2305 04/10/15 0950 04/10/15 1438 04/11/15 0548  NA 138 138  --  139  --  142  K 3.4* 3.4*  --  3.4*  --  3.4*  CL 105 106  --  106  --  108  CO2 21*  --   --  23  --  24  GLUCOSE 100* 94  --  179*  --  101*  BUN 53* 50*  --  48*  --  52*  CREATININE 5.13* 5.20* 4.99* 5.01*  --  4.90*  CALCIUM 9.1  --   --  8.5*  --  8.4*  PHOS  --   --   --   --  4.6 4.8*   Liver Function Tests:  Recent Labs Lab 04/09/15 1320  04/10/15 0950 04/11/15 0548  AST 23 19  --   ALT 26 22  --   ALKPHOS 70 57  --   BILITOT 1.4* 0.9  --   PROT 6.8 5.1*  --   ALBUMIN 3.9 2.8* 2.8*   No results for input(s): LIPASE, AMYLASE in the last 168 hours. No results for input(s): AMMONIA in the last 168 hours. CBC:  Recent Labs Lab 04/09/15 1320 04/09/15 1330 04/09/15 2305 04/10/15 0950  WBC 4.5  --  4.8 3.7*  NEUTROABS 3.3  --   --   --   HGB 10.3* 11.2* 10.2* 9.8*  HCT 32.1* 33.0* 30.8* 29.7*  MCV 90.4  --  89.5 90.3  PLT 163  --  158 168   Cardiac Enzymes: No results for input(s): CKTOTAL, CKMB, CKMBINDEX, TROPONINI in the last 168 hours. BNP (last 3 results) No results for input(s): BNP in the last 8760 hours.  ProBNP (last 3 results) No results for input(s): PROBNP in the last 8760 hours.  CBG:  Recent Labs Lab 04/09/15 1329  GLUCAP 92    No results found for this or any previous visit (from the past 240 hour(s)).   Studies: Ct Head Wo Contrast  04/09/2015  CLINICAL DATA:  LEFT-sided facial droop and LEFT-sided weakness noted at approximately 0815 hours earlier today. Initial encounter.  EXAM: CT HEAD WITHOUT CONTRAST  TECHNIQUE: Contiguous axial images were obtained from the base of the skull through the vertex without intravenous contrast.  COMPARISON:  None.  FINDINGS: There is an asymmetric area of hypoattenuation RIGHT centrum semiovale (see for instance image 15) which could represent an acute infarct. Correlate clinically. If no contraindications, MRI could confirm or exclude.  Similarly, there is a 1 cm sized area of asymmetric hypoattenuation in the RIGHT anterolateral thalamus as seen on image 12. Correlate clinically for LEFT-sided numbness. Confirm or exclude with MRI.  No hemorrhage, mass lesion, hydrocephalus, or extra-axial fluid.  Normal for age cerebral volume. Moderately advanced and likely premature hypoattenuation of the periventricular and subcortical white matter, consistent with  chronic microvascular ischemic change. Moderate vascular calcification in the carotid siphons.  Calvarium is intact.  There is no sinus or mastoid disease.  IMPRESSION: Two separate areas of asymmetric hypoattenuation as described in the RIGHT thalamus and RIGHT centrum semiovale. In the appropriate clinical setting, these may represent acute bland ischemia. See discussion above.  Suspected small vessel disease of the supratentorial white matter.   Electronically Signed   By: Staci Righter M.D.   On: 04/09/2015 14:55   Mr Virgel Paling Wo Contrast  04/09/2015   CLINICAL DATA:  54 year old male with left facial droop and left side weakness since Saturday. Recent hypertensive emergency with systolic blood pressure up to 200. Initial encounter.  EXAM: MRI HEAD WITHOUT CONTRAST  MRA HEAD WITHOUT CONTRAST  TECHNIQUE: Multiplanar, multiecho pulse sequences of the brain and surrounding structures were obtained without intravenous contrast. Angiographic images of the head were obtained using MRA technique without contrast.  COMPARISON:  Head CT without contrast 1443 hr today.  FINDINGS: MRI HEAD FINDINGS  12 mm area of restricted diffusion in the posterior right corona radiata tracking inferiorly toward the right posterior external capsule. Mild T2 and FLAIR hyperintensity. No associated hemorrhage or mass effect.  No other restricted diffusion. Major intracranial vascular flow voids are preserved.  No midline shift, mass effect, evidence of mass lesion, ventriculomegaly, extra-axial collection or acute intracranial hemorrhage. Cervicomedullary junction and pituitary are within normal limits. Patchy and confluent bilateral cerebral white matter T2 and FLAIR hyperintensity. Small chronic lacunar infarcts in the bilateral deep gray matter nuclei, sparing the right basal ganglia. Patchy T2 hyperintensity in the brainstem, and involving the right cerebellar peduncle. There is also some patchy left cerebellar hemisphere signal  abnormality (series 8, image 5). No cortical encephalomalacia. No definite chronic cerebral blood products identified.  Grossly negative visualized cervical spine. Normal bone marrow signal. Visible internal auditory structures appear normal. Mastoids are clear. Mild maxillary sinus mucosal thickening. Orbits soft tissues appear normal negative scalp soft tissues.  MRA HEAD FINDINGS  Antegrade flow in the posterior circulation. Mildly dominant distal left vertebral artery. Normal right PICA origin. Dominant appearing left AICA. Patent vertebrobasilar junction. No basilar artery stenosis. SCA and PCA origins are normal. Diminutive posterior communicating arteries. Moderate irregularity and stenosis of the left PCA P2 segment. Mild to moderate irregularity of the distal bilateral PCA branches.  Antegrade flow in both ICA siphons. No siphon stenosis. Ophthalmic and posterior communicating artery origins are within normal limits. Patent carotid termini. Normal MCA and ACA origins. Anterior communicating artery and visualized bilateral ACA branches are within normal limits (somewhat motion degraded).  Mild irregularity of the left MCA M1 segment and visualized left  MCA branches. Mild motion degradation. Similar motion affecting the right MCA branches. The right M1 segment is within normal limits. The right MCA bifurcation is patent. No major right MCA branch occlusion is identified.  IMPRESSION: 1. Small acute lacunar infarct of the right corroborate a cauda. No mass effect or hemorrhage. 2. Intracranial atherosclerosis most pronounced in the PCA branches. No right MCA branch stenosis or occlusion identified to correspond to #1. 3. Additional signal abnormality in the brain may also reflect chronic small vessel disease. However, cerebellar involvement, including right cerebellar peduncle involvement, is noted and can also be seen with chronic demyelinating disease.   Electronically Signed   By: Genevie Ann M.D.   On:  04/09/2015 19:53   Mr Brain Wo Contrast  04/09/2015   CLINICAL DATA:  54 year old male with left facial droop and left side weakness since Saturday. Recent hypertensive emergency with systolic blood pressure up to 200. Initial encounter.  EXAM: MRI HEAD WITHOUT CONTRAST  MRA HEAD WITHOUT CONTRAST  TECHNIQUE: Multiplanar, multiecho pulse sequences of the brain and surrounding structures were obtained without intravenous contrast. Angiographic images of the head were obtained using MRA technique without contrast.  COMPARISON:  Head CT without contrast 1443 hr today.  FINDINGS: MRI HEAD FINDINGS  12 mm area of restricted diffusion in the posterior right corona radiata tracking inferiorly toward the right posterior external capsule. Mild T2 and FLAIR hyperintensity. No associated hemorrhage or mass effect.  No other restricted diffusion. Major intracranial vascular flow voids are preserved.  No midline shift, mass effect, evidence of mass lesion, ventriculomegaly, extra-axial collection or acute intracranial hemorrhage. Cervicomedullary junction and pituitary are within normal limits. Patchy and confluent bilateral cerebral white matter T2 and FLAIR hyperintensity. Small chronic lacunar infarcts in the bilateral deep gray matter nuclei, sparing the right basal ganglia. Patchy T2 hyperintensity in the brainstem, and involving the right cerebellar peduncle. There is also some patchy left cerebellar hemisphere signal abnormality (series 8, image 5). No cortical encephalomalacia. No definite chronic cerebral blood products identified.  Grossly negative visualized cervical spine. Normal bone marrow signal. Visible internal auditory structures appear normal. Mastoids are clear. Mild maxillary sinus mucosal thickening. Orbits soft tissues appear normal negative scalp soft tissues.  MRA HEAD FINDINGS  Antegrade flow in the posterior circulation. Mildly dominant distal left vertebral artery. Normal right PICA origin. Dominant  appearing left AICA. Patent vertebrobasilar junction. No basilar artery stenosis. SCA and PCA origins are normal. Diminutive posterior communicating arteries. Moderate irregularity and stenosis of the left PCA P2 segment. Mild to moderate irregularity of the distal bilateral PCA branches.  Antegrade flow in both ICA siphons. No siphon stenosis. Ophthalmic and posterior communicating artery origins are within normal limits. Patent carotid termini. Normal MCA and ACA origins. Anterior communicating artery and visualized bilateral ACA branches are within normal limits (somewhat motion degraded).  Mild irregularity of the left MCA M1 segment and visualized left MCA branches. Mild motion degradation. Similar motion affecting the right MCA branches. The right M1 segment is within normal limits. The right MCA bifurcation is patent. No major right MCA branch occlusion is identified.  IMPRESSION: 1. Small acute lacunar infarct of the right corroborate a cauda. No mass effect or hemorrhage. 2. Intracranial atherosclerosis most pronounced in the PCA branches. No right MCA branch stenosis or occlusion identified to correspond to #1. 3. Additional signal abnormality in the brain may also reflect chronic small vessel disease. However, cerebellar involvement, including right cerebellar peduncle involvement, is noted and can also be seen  with chronic demyelinating disease.   Electronically Signed   By: Genevie Ann M.D.   On: 04/09/2015 19:53   US Renal  04/09/2015   ADDENDUM REPORT: 04/09/2015 17:39  ADDENDUM: Not mentioned above are bilateral pleural effusions.   Electronically Signed   By: Kathreen Devoid   On: 04/09/2015 17:39   04/09/2015   CLINICAL DATA:  Acute kidney injury.  EXAM: RENAL / URINARY TRACT ULTRASOUND COMPLETE  COMPARISON:  None.  FINDINGS: Right Kidney:  Length: 9.4 cm. Increased renal cortical echogenicity. No hydronephrosis. 6 x 5 x 5 mm anechoic right renal mass consistent with a small cyst.  Left Kidney:  Length:  8.5 cm. Increased renal cortical echogenicity. No mass or hydronephrosis visualized.  Bladder:  Appears normal for degree of bladder distention.  IMPRESSION: 1. No obstructive uropathy. 2. Increased renal cortical echogenicity as can be seen with medical renal disease.  Electronically Signed: By: Kathreen Devoid On: 04/09/2015 16:17   Dg Chest Port 1 View  04/09/2015   CLINICAL DATA:  Weakness, unable to move left arm. Sudden onset today. Left lower extremity swelling.  EXAM: PORTABLE CHEST - 1 VIEW  COMPARISON:  None.  FINDINGS: The lower lungs are excluded from the field of view limiting evaluation.  There is bilateral mild interstitial thickening. There is no focal parenchymal opacity. There is no pleural effusion or pneumothorax. The heart and mediastinal contours are unremarkable.  The osseous structures are unremarkable.  IMPRESSION: Mild bilateral interstitial thickening which may reflect interstitial infection versus interstitial edema.   Electronically Signed   By: Kathreen Devoid   On: 04/09/2015 17:43    Scheduled Meds: . amLODipine  5 mg Oral Daily  . aspirin  81 mg Oral Daily  . feeding supplement (ENSURE ENLIVE)  237 mL Oral BID BM  . furosemide  80 mg Intravenous BID  . heparin  5,000 Units Subcutaneous 3 times per day  . metoprolol tartrate  25 mg Oral BID   Continuous Infusions:  Antibiotics Given (last 72 hours)    None      Active Problems:   Hypertensive emergency   CVA (cerebral infarction)   AKI (acute kidney injury)   Stroke    Time spent: 25 min    VANN, [place] [facility] Pager [phone]. If 7PM-7AM, please contact night-coverage at [email or web address], password [facility] 04/11/2015, 8:46 AM  LOS: 2 days

## 2015-04-11 NOTE — Clinical Documentation Improvement (Signed)
  After study, please clarify nutritional status in progress notes and discharge summary  Possible Clinical Conditions?  Moderate Malnutrition   Moderate Protein Calorie Malnutrition Severe Protein Calorie Malnutrition Other Condition Cannot clinically determine   Supporting Information:  Per RD 8/9@1431   Initial Nutrition Assessment Non-severe (moderate) malnutrition in context of chronic illness  NUTRITION DIAGNOSIS:   Malnutrition related to poor appetite as evidenced by moderate depletions of muscle mass, percent weight loss  Pt states that a couple months ago he was weighing his usual weight of 165 lbs. He reports having a poor appetite and eating less over the past couple months with the summer heat. He knew he had lost weight but, didn't realize how much. He reports eating healthy with 3 meals daily. He denies any nausea, vomiting, or abdominal pain. Per pt's report, he has lost 12% of his body weight in less than 3 months; moderate muscle wasting per physical exam- pt meets criteria for moderate malnutrition.    04/09/15 5\' 7"  (1.702 m)       04/09/15 129 lb 15.7 oz (58.96 kg)      BMI: Body mass index is 20.35 kg/(m^2).  Treatments  Ensure Enlive po BID, each supplement provides 350 kcal and 20 grams of protein Provide and discussed "Heart Healthy Eating Nutrition Therapy" handout from the Academy of Nutrition and Dietetics  Thank You,  Mallorey Odonell T. Pricilla Handler, MSN, MBA/MHA Clinical Documentation Specialist [email or web address] Office # [phone]

## 2015-04-12 DIAGNOSIS — N179 Acute kidney failure, unspecified: Secondary | ICD-10-CM

## 2015-04-12 LAB — PROTEIN ELECTROPHORESIS, SERUM
A/G RATIO SPE: 1.3 (ref 0.7–1.7)
ALPHA-1-GLOBULIN: 0.3 g/dL (ref 0.0–0.4)
Albumin ELP: 3.3 g/dL (ref 2.9–4.4)
Alpha-2-Globulin: 0.7 g/dL (ref 0.4–1.0)
BETA GLOBULIN: 1 g/dL (ref 0.7–1.3)
Gamma Globulin: 0.7 g/dL (ref 0.4–1.8)
Globulin, Total: 2.6 g/dL (ref 2.2–3.9)
TOTAL PROTEIN ELP: 5.9 g/dL — AB (ref 6.0–8.5)

## 2015-04-12 LAB — BASIC METABOLIC PANEL
Anion gap: 12 (ref 5–15)
BUN: 54 mg/dL — AB (ref 6–20)
CO2: 24 mmol/L (ref 22–32)
Calcium: 9.1 mg/dL (ref 8.9–10.3)
Chloride: 105 mmol/L (ref 101–111)
Creatinine, Ser: 4.96 mg/dL — ABNORMAL HIGH (ref 0.61–1.24)
GFR calc Af Amer: 14 mL/min — ABNORMAL LOW (ref >60)
GFR, EST NON AFRICAN AMERICAN: 12 mL/min — AB (ref >60)
Glucose, Bld: 105 mg/dL — ABNORMAL HIGH (ref 65–99)
POTASSIUM: 3.7 mmol/L (ref 3.5–5.1)
SODIUM: 141 mmol/L (ref 135–145)

## 2015-04-12 LAB — HIV ANTIBODY (ROUTINE TESTING W REFLEX): HIV SCREEN 4TH GENERATION: NONREACTIVE

## 2015-04-12 LAB — PARATHYROID HORMONE, INTACT (NO CA): PTH: 158 pg/mL — ABNORMAL HIGH (ref 15–65)

## 2015-04-12 LAB — HEPATITIS PANEL, ACUTE
HCV Ab: <0.1 s/co ratio (ref 0.0–0.9)
Hep A IgM: NEGATIVE
Hep B C IgM: NEGATIVE
Hepatitis B Surface Ag: NEGATIVE

## 2015-04-12 MED ORDER — ASPIRIN 81 MG PO CHEW: 81.0000 mg | CHEWABLE_TABLET | Freq: Every day | ORAL | Status: DC

## 2015-04-12 MED ORDER — ATORVASTATIN CALCIUM 10 MG PO TABS: 10.0000 mg | ORAL_TABLET | Freq: Every day | ORAL | Status: DC

## 2015-04-12 MED ORDER — AMLODIPINE BESYLATE 10 MG PO TABS: 10.0000 mg | ORAL_TABLET | Freq: Every day | ORAL | Status: DC

## 2015-04-12 MED ORDER — METOPROLOL TARTRATE 50 MG PO TABS: 50.0000 mg | ORAL_TABLET | Freq: Two times a day (BID) | ORAL | Status: DC

## 2015-04-12 MED ORDER — ALBUTEROL SULFATE HFA 108 (90 BASE) MCG/ACT IN AERS: 1.0000 | INHALATION_SPRAY | Freq: Four times a day (QID) | RESPIRATORY_TRACT | Status: DC | PRN

## 2015-04-12 MED ORDER — METOPROLOL TARTRATE 50 MG PO TABS: 25.0000 mg | ORAL_TABLET | Freq: Two times a day (BID) | ORAL | Status: DC

## 2015-04-12 MED ORDER — FUROSEMIDE 80 MG PO TABS: 80.0000 mg | ORAL_TABLET | Freq: Every day | ORAL | Status: DC

## 2015-04-12 NOTE — Progress Notes (Signed)
Occupational Therapy Progress Note  Pt able to perform ADLs with supervision.  Updated HEP.  Recommend HHOT vs. OPOT at discharge.    04/12/15 1700  OT Visit Information  Last OT Received On 04/12/15  Assistance Needed +1  History of Present Illness 54 yo who worked at [employer] who presented with L sided weakness and sensory changes. CT+CT brain was reviewed and showed two separate areas of asymmetric hypoattenuation as described in the RIGHT thalamus and RIGHT centrum semiovale concerning for acute ischemia.  OT Time Calculation  OT Start Time (ACUTE ONLY) 1247  OT Stop Time (ACUTE ONLY) 1323  OT Time Calculation (min) 36 min  Precautions  Precautions Fall  Pain Assessment  Pain Assessment Faces  Faces Pain Scale 4  Pain Location hips  Pain Descriptors / Indicators Aching  Pain Intervention(s) Monitored during session  Cognition  Arousal/Alertness Awake/alert  Behavior During Therapy Impulsive  Overall Cognitive Status Impaired/Different from baseline  Area of Impairment Attention  Current Attention Level Alternating;Divided  Safety/Judgement Decreased awareness of safety  Awareness Emergent  ADL  Lower Body Dressing Supervision/safety;Sit to/from stand  General ADL Comments Pt is insistant on using Lt UE as an assist during all activities and thus requires increased time to complete.  He demonstrates ability to perform ADLs with supervision and increased time.   Bed Mobility  Overal bed mobility Independent  Balance  Overall balance assessment Needs assistance  Sitting-balance support Feet supported  Sitting balance-Leahy Scale Good  Standing balance support During functional activity  Standing balance-Leahy Scale Fair  Transfers  Overall transfer level Modified independent  Other Exercises  Other Exercises Pt instructed in movement with good quality and good aligment of shoulder.  Emphasized need to keep elbow extended and thumb up  when performing shoulder elevation.  He returned demonstration with min verbal cues  Other Exercises Instructed pt to perform finger extension x 10 per every one attempt at finger flexion.  He demonstrated understanding.   Other Exercises Pt was able to demonstrate independence with exercises previously provided   OT - End of Session  Activity Tolerance Patient tolerated treatment well  Patient left in bed;with call bell/phone within reach  OT Assessment/Plan  OT Plan Discharge plan needs to be updated  OT Frequency (ACUTE ONLY) Min 2X/week  Follow Up Recommendations Outpatient OT  OT Equipment Tub/shower seat  OT Goal Progression  Progress towards OT goals Progressing toward goals  ADL Goals  Pt Will Perform Grooming with modified independence;standing  Pt Will Perform Upper Body Bathing with modified independence;sitting  Pt Will Perform Lower Body Bathing with modified independence;sit to/from stand  Pt Will Perform Upper Body Dressing with modified independence;sitting  Pt Will Perform Lower Body Dressing with modified independence;sit to/from stand  Pt Will Transfer to Toilet with modified independence;ambulating  Pt Will Perform Toileting - Clothing Manipulation and hygiene with modified independence;sit to/from stand  Pt/caregiver will Perform Home Exercise Program Left upper extremity;With Supervision;With written HEP provided  OT General Charges  $OT Visit 1 Procedure  OT Treatments  $Neuromuscular Re-education 23-37 mins  [facility], OTR/L [phone]

## 2015-04-12 NOTE — Progress Notes (Signed)
Patient walked around the unit twice with minimal assist. Marcedes Tech, Rande Brunt, RN

## 2015-04-12 NOTE — Progress Notes (Addendum)
Rehab admissions - I met with pt in follow up to rehab consult to explain that rehab MD is recommending home with home health. Pt had many questions about his recovery and I encouraged him to speak with his MD. Support provided. Pt was up ambulating in his room independently upon my entry and did demonstrate weakness with L UE. "I've been doing those exercises they showed me."  I will now sign off pt's case in light of rehab MD recommendation for home with home health. I updated Loma Sousa, case Freight forwarder as well. Thanks.  Nanetta Batty, PT Rehabilitation Admissions Coordinator [phone]

## 2015-04-12 NOTE — Progress Notes (Signed)
RN discussed discharge instructions including follow up appointments and medications. Patient was directed to return to work as directed by doctor, although patient states he will be returning on Monday. Patient vocalized understanding of instructions. Patient agreed to [facility], consent signed and order placed in chart, consent faxed. Patient denies any pain, neuro assessment unchanged. Patient will be taken home by roommate, currently waiting for roommate. IV discontinued per protocol, cardiac monitor discontinued per protocol. Will continue to monitor.

## 2015-04-12 NOTE — Progress Notes (Signed)
S: No uremic sxs O:BP 168/84 mmHg  Pulse 65  Temp(Src) 98.2 F (36.8 C) (Oral)  Resp 17  Ht 5\' 7"  (1.702 m)  Wt 58.96 kg (129 lb 15.7 oz)  BMI 20.35 kg/m2  SpO2 99%  Intake/Output Summary (Last 24 hours) at 04/12/15 1043 Last data filed at 04/12/15 0939  Gross per 24 hour  Intake    800 ml  Output   2575 ml  Net  -1775 ml   Weight change:  [id number] and alert CVS:RRR Resp:clear Abd:+ BS NTND Ext:1+ edema NEURO: Lt facial droop improved.  Ox3 no asterixis   . amLODipine  5 mg Oral Daily  . aspirin  81 mg Oral Daily  . atorvastatin  10 mg Oral q1800  . feeding supplement (NEPRO CARB STEADY)  237 mL Oral Q24H  . ferumoxytol  510 mg Intravenous Weekly  . furosemide  80 mg Oral BID  . heparin  5,000 Units Subcutaneous 3 times per day  . metoprolol tartrate  25 mg Oral BID   No results found. BMET    Component Value Date/Time   NA 141 04/12/2015 0638   K 3.7 04/12/2015 0638   CL 105 04/12/2015 0638   CO2 24 04/12/2015 0638   GLUCOSE 105* 04/12/2015 0638   BUN 54* 04/12/2015 0638   CREATININE 4.96* 04/12/2015 0638   CALCIUM 9.1 04/12/2015 0638   GFRNONAA 12* 04/12/2015 0638   GFRAA 14* 04/12/2015 0638   CBC    Component Value Date/Time   WBC 3.7* 04/10/2015 0950   RBC 3.29* 04/10/2015 0950   HGB 9.8* 04/10/2015 0950   HCT 29.7* 04/10/2015 0950   PLT 168 04/10/2015 0950   MCV 90.3 04/10/2015 0950   MCH 29.8 04/10/2015 0950   MCHC 33.0 04/10/2015 0950   RDW 15.6* 04/10/2015 0950   LYMPHSABS 0.7 04/09/2015 1320   MONOABS 0.4 04/09/2015 1320   EOSABS 0.1 04/09/2015 1320   BASOSABS 0.1 04/09/2015 1320     Assessment:  1. CKD sec HTN 2. Anemia and Fe def, SP IV iron 3. Lacunar stroke 4. HTN  Plan: 1. He can go from renal standpoint and has appt with me 8/25 at 2:15pm   Thomas Robinson T

## 2015-04-12 NOTE — Progress Notes (Signed)
Physical Therapy Treatment Patient Details Name: Thomas Robinson MRN: [id number] DOB: 1960/12/02 Today's Date: 04/12/2015    History of Present Illness 54 yo who worked at [employer] who presented with L sided weakness and sensory changes. CT+CT brain was reviewed and showed two separate areas of asymmetric hypoattenuation as described in the RIGHT thalamus and RIGHT centrum semiovale concerning for acute ischemia.    PT Comments    Patient progressing well towards PT goals. Continues to have some difficulty advancing LLE esp when distracted or fatigued. Pt high fall risk with DGI score of 11/24. Encouraged weight bearing and functional activities to improve left sided weakness. Pt requires Min A for stair training. Discharge recommendation updated to home with HHPT as pt denied CIR. Will follow acutely.   Follow Up Recommendations  Home health PT;Supervision for mobility/OOB     Equipment Recommendations  None recommended by PT    Recommendations for Other Services       Precautions / Restrictions Precautions Precautions: Fall Restrictions Weight Bearing Restrictions: No    Mobility  Bed Mobility Overal bed mobility: Needs Assistance Bed Mobility: Supine to Sit     Supine to sit: Supervision;HOB elevated     General bed mobility comments: S for safety and cues for supporting LUE during transfer.  Transfers Overall transfer level: Needs assistance Equipment used: None Transfers: Sit to/from Stand Sit to Stand: Supervision         General transfer comment: Supervision for safety. Requires assist at times for balance during quick turns or transfers. No overt LOB.  Ambulation/Gait Ambulation/Gait assistance: Min guard;Min assist Ambulation Distance (Feet): 400 Feet Assistive device: None Gait Pattern/deviations: Step-through pattern;Decreased dorsiflexion - left;Drifts right/left;Decreased stride length;Staggering left;Decreased step length - left Gait velocity:  decreased Gait velocity interpretation: Below normal speed for age/gender General Gait Details: Drifting at times esp when distracted or during conversation. Staggering noted as well esp during dynamic tasks such as turns, head turns, direction changes etc.    Stairs Stairs: Yes Stairs assistance: Min assist Stair Management: No rails;One rail Right Number of Stairs: 3 (+ 2 steps x3 bouts.) General stair comments: Repetition of cues for technique during session as pt continually placing wrong foot up/down despite instructions and grabbing rail for support. Difficulty with applying instructions. Min A for balance.   Wheelchair Mobility    Modified Rankin (Stroke Patients Only) Modified Rankin (Stroke Patients Only) Pre-Morbid Rankin Score: No symptoms Modified Rankin: Moderately severe disability     Balance Overall balance assessment: Needs assistance;History of Falls Sitting-balance support: Feet supported;No upper extremity supported Sitting balance-Leahy Scale: Good     Standing balance support: During functional activity Standing balance-Leahy Scale: Fair               High level balance activites: Direction changes;Turns;Sudden stops;Head turns High Level Balance Comments: Min A for balance during above activities; no overt LOB but deviations in gait noted.     Cognition Arousal/Alertness: Awake/alert Behavior During Therapy: Impulsive Overall Cognitive Status: Impaired/Different from baseline Area of Impairment: Following commands;Awareness;Safety/judgement       Following Commands: Follows multi-step commands inconsistently (Requires repetition.) Safety/Judgement: Decreased awareness of deficits;Decreased awareness of safety Awareness: Emergent        Exercises Other Exercises Other Exercises: Weightbearing through LUE/LLE for sit to stands x10.    General Comments        Pertinent Vitals/Pain Pain Assessment: Faces Faces Pain Scale: Hurts little  more Pain Location: hips and back Pain Descriptors / Indicators: Sore  Pain Intervention(s): Monitored during session;Repositioned    Home Living                      Prior Function            PT Goals (current goals can now be found in the care plan section) Progress towards PT goals: Progressing toward goals    Frequency  Min 3X/week    PT Plan Discharge plan needs to be updated    Co-evaluation             End of Session Equipment Utilized During Treatment: Gait belt Activity Tolerance: Patient tolerated treatment well Patient left: in bed;with call bell/phone within reach     Time: 1457-1526 PT Time Calculation (min) (ACUTE ONLY): 29 min  Charges:  $Gait Training: 8-22 mins $Neuromuscular Re-education: 8-22 mins                    G Codes:      Tomma Ehinger A Kourtland Coopman 04/12/2015, 3:42 PM  Wray Kearns, [place], DPT [phone]

## 2015-04-12 NOTE — Discharge Instructions (Signed)
STROKE/TIA DISCHARGE INSTRUCTIONS SMOKING Cigarette smoking nearly doubles your risk of having a stroke & is the single most alterable risk factor  If you smoke or have smoked in the last 12 months, you are advised to quit smoking for your health.  Most of the excess cardiovascular risk related to smoking disappears within a year of stopping.  Ask you doctor about anti-smoking medications  [place] Quit Line: 1-800-QUIT NOW  Free Smoking Cessation Classes [phone]  CHOLESTEROL Know your levels; limit fat & cholesterol in your diet  Lipid Panel     Component Value Date/Time   CHOL 162 04/11/2015 0951   TRIG 125 04/11/2015 0951   HDL 45 04/11/2015 0951   CHOLHDL 3.6 04/11/2015 0951   VLDL 25 04/11/2015 0951   LDLCALC 92 04/11/2015 0951      Many patients benefit from treatment even if their cholesterol is at goal.  Goal: Total Cholesterol (CHOL) less than 160  Goal:  Triglycerides (TRIG) less than 150  Goal:  HDL greater than 40  Goal:  LDL (LDLCALC) less than 100   BLOOD PRESSURE American Stroke Association blood pressure target is less that 120/80 mm/Hg  Your discharge blood pressure is:  BP: (!) 160/82 mmHg  Monitor your blood pressure  Limit your salt and alcohol intake  Many individuals will require more than one medication for high blood pressure  DIABETES (A1c is a blood sugar average for last 3 months) Goal HGBA1c is under 7% (HBGA1c is blood sugar average for last 3 months)  Diabetes: No known diagnosis of diabetes    Lab Results  Component Value Date   HGBA1C 5.1 04/09/2015     Your HGBA1c can be lowered with medications, healthy diet, and exercise.  Check your blood sugar as directed by your physician  Call your physician if you experience unexplained or low blood sugars.  PHYSICAL ACTIVITY/REHABILITATION Goal is 30 minutes at least 4 days per week  Activity: Increase activity slowly, Therapies: Physical Therapy: Home Health and Occupational Therapy:  Home Health Return to work: Advised by doctor.  Activity decreases your risk of heart attack and stroke and makes your heart stronger.  It helps control your weight and blood pressure; helps you relax and can improve your mood.  Participate in a regular exercise program.  Talk with your doctor about the best form of exercise for you (dancing, walking, swimming, cycling).  DIET/WEIGHT Goal is to maintain a healthy weight  Your discharge diet is: Diet renal with fluid restriction Fluid restriction:: 1800 mL Fluid; Room service appropriate?: Yes; Fluid consistency:: Thin Diet - low sodium heart healthy thin liquids Your height is:  Height: 5\' 7"  (170.2 cm) Your current weight is: Weight: 58.96 kg (129 lb 15.7 oz) Your Body Mass Index (BMI) is:  BMI (Calculated): 20.4  Following the type of diet specifically designed for you will help prevent another stroke.  Your goal Body Mass Index (BMI) is 19-24.  Healthy food habits can help reduce 3 risk factors for stroke:  High cholesterol, hypertension, and excess weight.  RESOURCES Stroke/Support Group:  Call [phone]   STROKE EDUCATION PROVIDED/REVIEWED AND GIVEN TO PATIENT Stroke warning signs and symptoms How to activate emergency medical system (call 911). Medications prescribed at discharge. Need for follow-up after discharge. Personal risk factors for stroke. Pneumonia vaccine given: No Flu vaccine given: No My questions have been answered, the writing is legible, and I understand these instructions.  I will adhere to these goals & educational materials that have  been provided to me after my discharge from the hospital.

## 2015-04-12 NOTE — Care Management Note (Signed)
Case Management Note  Patient Details  Name: Thomas Robinson MRN: [id number] Date of Birth: February 06, 1961  Subjective/Objective:                    Action/Plan: Met with patient to discuss discharge planning. Patient will be discharging home with home health, per bedside RN.  Patient is agreeable to home health services.  Miranda with [facility] was notified and has accepted the referral. Awaiting orders.  Patient has a follow-up appointment scheduled at the [facility]. [place] letter will be provided, as discharge may occur after the [facility] pharmacy has closed. Bedside RN updated.  Expected Discharge Date:                  Expected Discharge Plan:  [place] (patient lives at home with friend)  In-House Referral:  Development worker, community  Discharge [facility], CM Consult, Medication Assistance, [facility] Program  Post Acute Care Choice:  Home Health Choice offered to:  Patient  DME Arranged:    DME Agency:     HH Arranged:  RN, PT (CSW) [place] Agency:  [place]  Status of Service:  Completed, signed off  Medicare Important Message Given:    Date Medicare IM Given:    Medicare IM give by:    Date Additional Medicare IM Given:    Additional Medicare Important Message give by:     If discussed at [place] of Stay Meetings, dates discussed:    Additional Comments:  [name], RN 04/12/2015, 3:02 PM

## 2015-04-13 DIAGNOSIS — I6330 Cerebral infarction due to thrombosis of unspecified cerebral artery: Secondary | ICD-10-CM | POA: Insufficient documentation

## 2015-04-17 ENCOUNTER — Inpatient Hospital Stay: Payer: Self-pay | Admitting: Family Medicine

## 2015-04-29 NOTE — Discharge Summary (Signed)
Physician Discharge Summary  Thomas Robinson [id number] DOB: 04-03-1961 DOA: 04/09/2015  PCP: No PCP Per Patient  Admit date: 04/09/2015 Discharge date: 04/12/2015  Time spent: greater than 30 minutes  Recommendations for Outpatient Follow-up:   Home health arranged  Discharge Diagnoses:  Active Problems:   Hypertensive emergency   CVA (cerebral infarction)   AKI (acute kidney injury)   Stroke   Cerebral infarction due to thrombosis of cerebral artery   Discharge Condition: stable  Diet recommendation: heart healthy  Filed Weights   04/09/15 2233  Weight: 58.96 kg (129 lb 15.7 oz)    History of present illness:  54 y.o. male, right handed, with a past medical history significant for untreated HTN for many years, and asthma, comes in for evaluation of lower extremity swelling and fatigue for the past week or so, and while being evaluated for such complains he also manifested difficulty using his left side as wel las left face droop. Patient stated that this past Saturday 04/07/2015 he went to the gym and noticed some limitation using his left arm which was "kind of clumsy". The clumsiness/heaviness of the left arm did not get any better and subsequently he started having problems with balance and left leg heaviness which had caused him to fall x 3. This morning 04/08/2015 his coworkers noted that his left face was droopier than the right. Denies associated HA, vertigo, double vision, difficulty swallowing, slurred speech, language or vision impairment. Upon arrival to the ED noted to have BP 243/128 mmHg. Creatinine is 5.2, last creatinine was only mildly elevated in 2014 at 1.6. CT brain showed two separate areas of asymmetric hypoattenuation as described in the RIGHT thalamus and RIGHT centrum semiovale concerning for acute ischemia.  TPA not given, late presentation  Hospital Course:  admitted to telemetry. Neurology consulted.  Stroke: Non-dominant right corona radiata infarct  secondary to small vessel disease   Resultant Left hemiparesis hand> leg. L facial droop.   MRI R corona radiata infarct  MRA PCA atherosclerosis  Carotid Doppler No significant stenosis  2D Echo No source of embolus. Pulmonary hypertension   LDL 92  HgbA1c 5.1  Neurology recommends asa and statin, control of risk facters  Accelerated Hypertension  BP 243/128 as high as on arrival  Not taking Home meds as prescribed  Started on cardene drip, now off  Antihypertensives adjusted. Now controlled   Hyperlipidemia, LDL 92, on no statin PTA, added lipitor 10, goal LDL < 70. Continue statin at d/c   ETOH use: no evidence of withdrawal  Acute on chronic renal failure: nephrology consulted. Cleared for discharge with outpatient f/u  Procedures:  none  Consultations:  Neurology  nephrology  Discharge Exam: Filed Vitals:   04/12/15 1400  BP: 160/82  Pulse: 72  Temp: 97.6 F (36.4 C)  Resp: 15    General: a and o Cardiovascular: RRR Respiratory: cTA Neuro: left sided weakness  Discharge Instructions   Discharge Instructions    Ambulatory referral to Neurology    Complete by:  As directed   Please schedule post stroke follow up in 2 months.     Diet - low sodium heart healthy    Complete by:  As directed      Increase activity slowly    Complete by:  As directed           Discharge Medication List as of 04/12/2015  4:40 PM    START taking these medications   Details  amLODipine (NORVASC) 10 MG  tablet Take 1 tablet (10 mg total) by mouth daily., Starting 04/12/2015, Until Discontinued, Print    aspirin 81 MG chewable tablet Chew 1 tablet (81 mg total) by mouth daily., Starting 04/12/2015, Until Discontinued, OTC    atorvastatin (LIPITOR) 10 MG tablet Take 1 tablet (10 mg total) by mouth daily at 6 PM., Starting 04/12/2015, Until Discontinued, Print    furosemide (LASIX) 80 MG tablet Take 1 tablet (80 mg total) by mouth daily., Starting  04/12/2015, Until Discontinued, Print      CONTINUE these medications which have CHANGED   Details  albuterol (PROVENTIL HFA;VENTOLIN HFA) 108 (90 BASE) MCG/ACT inhaler Inhale 1-2 puffs into the lungs every 6 (six) hours as needed for wheezing or shortness of breath., Starting 04/12/2015, Until Discontinued, Print    metoprolol (LOPRESSOR) 50 MG tablet Take 1 tablet (50 mg total) by mouth 2 (two) times daily., Starting 04/12/2015, Until Discontinued, Print      CONTINUE these medications which have NOT CHANGED   Details  acetaminophen (TYLENOL) 500 MG tablet Take 1,000 mg by mouth every 4 (four) hours as needed for mild pain or headache., Until Discontinued, Historical Med    Cyanocobalamin (VITAMIN B 12 PO) Take 1 tablet by mouth daily., Until Discontinued, Historical Med    DiphenhydrAMINE HCl (ZZZQUIL) 50 MG/30ML LIQD Take 30 mLs by mouth at bedtime as needed (for sleep)., Until Discontinued, Historical Med    Fluticasone-Salmeterol (ADVAIR) 100-50 MCG/DOSE AEPB Inhale 1 puff into the lungs daily as needed (for shortness of breath)., Until Discontinued, Historical Med    Homeopathic Products ([employer] LEG CRAMPS PAIN RELIEF) TABS Take 2 tablets by mouth at bedtime as needed (for pain)., Until Discontinued, Historical Med    Iron TABS Take 1 tablet by mouth daily., Until Discontinued, Historical Med    Omega-3 Fatty Acids (FISH OIL PO) Take 1 capsule by mouth daily., Until Discontinued, Historical Med      STOP taking these medications     ePHEDrine-GuaiFENesin (PRIMATENE ASTHMA) 12.5-200 MG TABS      Multiple Vitamin (MULTIVITAMIN WITH MINERALS) TABS tablet      Multiple Vitamins-Minerals (EYE SUPPORT) TABS      naproxen sodium (ANAPROX) 220 MG tablet      VITAMIN E PO      lisinopril (PRINIVIL,ZESTRIL) 10 MG tablet        No Known Allergies Follow-up Information    Follow up with SETHI,PRAMOD, MD In 2 months.   Specialties:  Neurology, Radiology   Why:  Stroke Clinic,  Office will call you with appointment date & time   Contact information:   [place] [address] [phone]       Follow up with [facility] On 04/17/2015.   Why:  11:30am   Contact information:   [address] [place] [phone]      Follow up with Windy Kalata, MD On 04/26/2015.   Specialty:  Nephrology   Why:  2:30 pm   Contact information:   [place] [address] [phone]        The results of significant diagnostics from this hospitalization (including imaging, microbiology, ancillary and laboratory) are listed below for reference.    Significant Diagnostic Studies: Ct Head Wo Contrast  04/09/2015   CLINICAL DATA:  LEFT-sided facial droop and LEFT-sided weakness noted at approximately 0815 hours earlier today. Initial encounter.  EXAM: CT HEAD WITHOUT CONTRAST  TECHNIQUE: Contiguous axial images were obtained  from the base of the skull through the vertex without intravenous contrast.  COMPARISON:  None.  FINDINGS: There is an asymmetric area of hypoattenuation RIGHT centrum semiovale (see for instance image 15) which could represent an acute infarct. Correlate clinically. If no contraindications, MRI could confirm or exclude.  Similarly, there is a 1 cm sized area of asymmetric hypoattenuation in the RIGHT anterolateral thalamus as seen on image 12. Correlate clinically for LEFT-sided numbness. Confirm or exclude with MRI.  No hemorrhage, mass lesion, hydrocephalus, or extra-axial fluid.  Normal for age cerebral volume. Moderately advanced and likely premature hypoattenuation of the periventricular and subcortical white matter, consistent with chronic microvascular ischemic change. Moderate vascular calcification in the carotid siphons.  Calvarium is intact.  There is no sinus or mastoid disease.  IMPRESSION: Two separate areas of asymmetric hypoattenuation as  described in the RIGHT thalamus and RIGHT centrum semiovale. In the appropriate clinical setting, these may represent acute bland ischemia. See discussion above.  Suspected small vessel disease of the supratentorial white matter.   Electronically Signed   By: Staci Righter M.D.   On: 04/09/2015 14:55   Mr Virgel Paling Wo Contrast  04/09/2015   CLINICAL DATA:  54 year old male with left facial droop and left side weakness since Saturday. Recent hypertensive emergency with systolic blood pressure up to 200. Initial encounter.  EXAM: MRI HEAD WITHOUT CONTRAST  MRA HEAD WITHOUT CONTRAST  TECHNIQUE: Multiplanar, multiecho pulse sequences of the brain and surrounding structures were obtained without intravenous contrast. Angiographic images of the head were obtained using MRA technique without contrast.  COMPARISON:  Head CT without contrast 1443 hr today.  FINDINGS: MRI HEAD FINDINGS  12 mm area of restricted diffusion in the posterior right corona radiata tracking inferiorly toward the right posterior external capsule. Mild T2 and FLAIR hyperintensity. No associated hemorrhage or mass effect.  No other restricted diffusion. Major intracranial vascular flow voids are preserved.  No midline shift, mass effect, evidence of mass lesion, ventriculomegaly, extra-axial collection or acute intracranial hemorrhage. Cervicomedullary junction and pituitary are within normal limits. Patchy and confluent bilateral cerebral white matter T2 and FLAIR hyperintensity. Small chronic lacunar infarcts in the bilateral deep gray matter nuclei, sparing the right basal ganglia. Patchy T2 hyperintensity in the brainstem, and involving the right cerebellar peduncle. There is also some patchy left cerebellar hemisphere signal abnormality (series 8, image 5). No cortical encephalomalacia. No definite chronic cerebral blood products identified.  Grossly negative visualized cervical spine. Normal bone marrow signal. Visible internal auditory  structures appear normal. Mastoids are clear. Mild maxillary sinus mucosal thickening. Orbits soft tissues appear normal negative scalp soft tissues.  MRA HEAD FINDINGS  Antegrade flow in the posterior circulation. Mildly dominant distal left vertebral artery. Normal right PICA origin. Dominant appearing left AICA. Patent vertebrobasilar junction. No basilar artery stenosis. SCA and PCA origins are normal. Diminutive posterior communicating arteries. Moderate irregularity and stenosis of the left PCA P2 segment. Mild to moderate irregularity of the distal bilateral PCA branches.  Antegrade flow in both ICA siphons. No siphon stenosis. Ophthalmic and posterior communicating artery origins are within normal limits. Patent carotid termini. Normal MCA and ACA origins. Anterior communicating artery and visualized bilateral ACA branches are within normal limits (somewhat motion degraded).  Mild irregularity of the left MCA M1 segment and visualized left MCA branches. Mild motion degradation. Similar motion affecting the right MCA branches. The right M1 segment is within normal limits. The right MCA bifurcation is patent. No major right MCA branch  occlusion is identified.  IMPRESSION: 1. Small acute lacunar infarct of the right corroborate a cauda. No mass effect or hemorrhage. 2. Intracranial atherosclerosis most pronounced in the PCA branches. No right MCA branch stenosis or occlusion identified to correspond to #1. 3. Additional signal abnormality in the brain may also reflect chronic small vessel disease. However, cerebellar involvement, including right cerebellar peduncle involvement, is noted and can also be seen with chronic demyelinating disease.   Electronically Signed   By: Genevie Ann M.D.   On: 04/09/2015 19:53   Mr Brain Wo Contrast  04/09/2015   CLINICAL DATA:  54 year old male with left facial droop and left side weakness since Saturday. Recent hypertensive emergency with systolic blood pressure up to 200.  Initial encounter.  EXAM: MRI HEAD WITHOUT CONTRAST  MRA HEAD WITHOUT CONTRAST  TECHNIQUE: Multiplanar, multiecho pulse sequences of the brain and surrounding structures were obtained without intravenous contrast. Angiographic images of the head were obtained using MRA technique without contrast.  COMPARISON:  Head CT without contrast 1443 hr today.  FINDINGS: MRI HEAD FINDINGS  12 mm area of restricted diffusion in the posterior right corona radiata tracking inferiorly toward the right posterior external capsule. Mild T2 and FLAIR hyperintensity. No associated hemorrhage or mass effect.  No other restricted diffusion. Major intracranial vascular flow voids are preserved.  No midline shift, mass effect, evidence of mass lesion, ventriculomegaly, extra-axial collection or acute intracranial hemorrhage. Cervicomedullary junction and pituitary are within normal limits. Patchy and confluent bilateral cerebral white matter T2 and FLAIR hyperintensity. Small chronic lacunar infarcts in the bilateral deep gray matter nuclei, sparing the right basal ganglia. Patchy T2 hyperintensity in the brainstem, and involving the right cerebellar peduncle. There is also some patchy left cerebellar hemisphere signal abnormality (series 8, image 5). No cortical encephalomalacia. No definite chronic cerebral blood products identified.  Grossly negative visualized cervical spine. Normal bone marrow signal. Visible internal auditory structures appear normal. Mastoids are clear. Mild maxillary sinus mucosal thickening. Orbits soft tissues appear normal negative scalp soft tissues.  MRA HEAD FINDINGS  Antegrade flow in the posterior circulation. Mildly dominant distal left vertebral artery. Normal right PICA origin. Dominant appearing left AICA. Patent vertebrobasilar junction. No basilar artery stenosis. SCA and PCA origins are normal. Diminutive posterior communicating arteries. Moderate irregularity and stenosis of the left PCA P2 segment.  Mild to moderate irregularity of the distal bilateral PCA branches.  Antegrade flow in both ICA siphons. No siphon stenosis. Ophthalmic and posterior communicating artery origins are within normal limits. Patent carotid termini. Normal MCA and ACA origins. Anterior communicating artery and visualized bilateral ACA branches are within normal limits (somewhat motion degraded).  Mild irregularity of the left MCA M1 segment and visualized left MCA branches. Mild motion degradation. Similar motion affecting the right MCA branches. The right M1 segment is within normal limits. The right MCA bifurcation is patent. No major right MCA branch occlusion is identified.  IMPRESSION: 1. Small acute lacunar infarct of the right corroborate a cauda. No mass effect or hemorrhage. 2. Intracranial atherosclerosis most pronounced in the PCA branches. No right MCA branch stenosis or occlusion identified to correspond to #1. 3. Additional signal abnormality in the brain may also reflect chronic small vessel disease. However, cerebellar involvement, including right cerebellar peduncle involvement, is noted and can also be seen with chronic demyelinating disease.   Electronically Signed   By: Genevie Ann M.D.   On: 04/09/2015 19:53   US Renal  04/09/2015   ADDENDUM REPORT: 04/09/2015  17:39  ADDENDUM: Not mentioned above are bilateral pleural effusions.   Electronically Signed   By: Kathreen Devoid   On: 04/09/2015 17:39   04/09/2015   CLINICAL DATA:  Acute kidney injury.  EXAM: RENAL / URINARY TRACT ULTRASOUND COMPLETE  COMPARISON:  None.  FINDINGS: Right Kidney:  Length: 9.4 cm. Increased renal cortical echogenicity. No hydronephrosis. 6 x 5 x 5 mm anechoic right renal mass consistent with a small cyst.  Left Kidney:  Length: 8.5 cm. Increased renal cortical echogenicity. No mass or hydronephrosis visualized.  Bladder:  Appears normal for degree of bladder distention.  IMPRESSION: 1. No obstructive uropathy. 2. Increased renal cortical  echogenicity as can be seen with medical renal disease.  Electronically Signed: By: Kathreen Devoid On: 04/09/2015 16:17   Dg Chest Port 1 View  04/09/2015   CLINICAL DATA:  Weakness, unable to move left arm. Sudden onset today. Left lower extremity swelling.  EXAM: PORTABLE CHEST - 1 VIEW  COMPARISON:  None.  FINDINGS: The lower lungs are excluded from the field of view limiting evaluation.  There is bilateral mild interstitial thickening. There is no focal parenchymal opacity. There is no pleural effusion or pneumothorax. The heart and mediastinal contours are unremarkable.  The osseous structures are unremarkable.  IMPRESSION: Mild bilateral interstitial thickening which may reflect interstitial infection versus interstitial edema.   Electronically Signed   By: Kathreen Devoid   On: 04/09/2015 17:43    Microbiology: No results found for this or any previous visit (from the past 240 hour(s)).   Labs: Basic Metabolic Panel: No results for input(s): NA, K, CL, CO2, GLUCOSE, BUN, CREATININE, CALCIUM, MG, PHOS in the last 168 hours. Liver Function Tests: No results for input(s): AST, ALT, ALKPHOS, BILITOT, PROT, ALBUMIN in the last 168 hours. No results for input(s): LIPASE, AMYLASE in the last 168 hours. No results for input(s): AMMONIA in the last 168 hours. CBC: No results for input(s): WBC, NEUTROABS, HGB, HCT, MCV, PLT in the last 168 hours. Cardiac Enzymes: No results for input(s): CKTOTAL, CKMB, CKMBINDEX, TROPONINI in the last 168 hours. BNP: BNP (last 3 results) No results for input(s): BNP in the last 8760 hours.  ProBNP (last 3 results) No results for input(s): PROBNP in the last 8760 hours.  CBG: No results for input(s): GLUCAP in the last 168 hours.     SignedDelfina Redwood  [facility] 04/29/2015, 9:10 PM

## 2015-12-13 IMAGING — DX DG CHEST 1V PORT
1 series · 2 of 2 positions shown · non-contrast
Comparison: None.

CLINICAL DATA: Weakness, unable to move left arm. Sudden onset
today. Left lower extremity swelling.

EXAM:
PORTABLE CHEST - 1 VIEW

[Series 1: chest ap · 0.14mm/px · 2 of 2 slices shown]
[im 1/2]
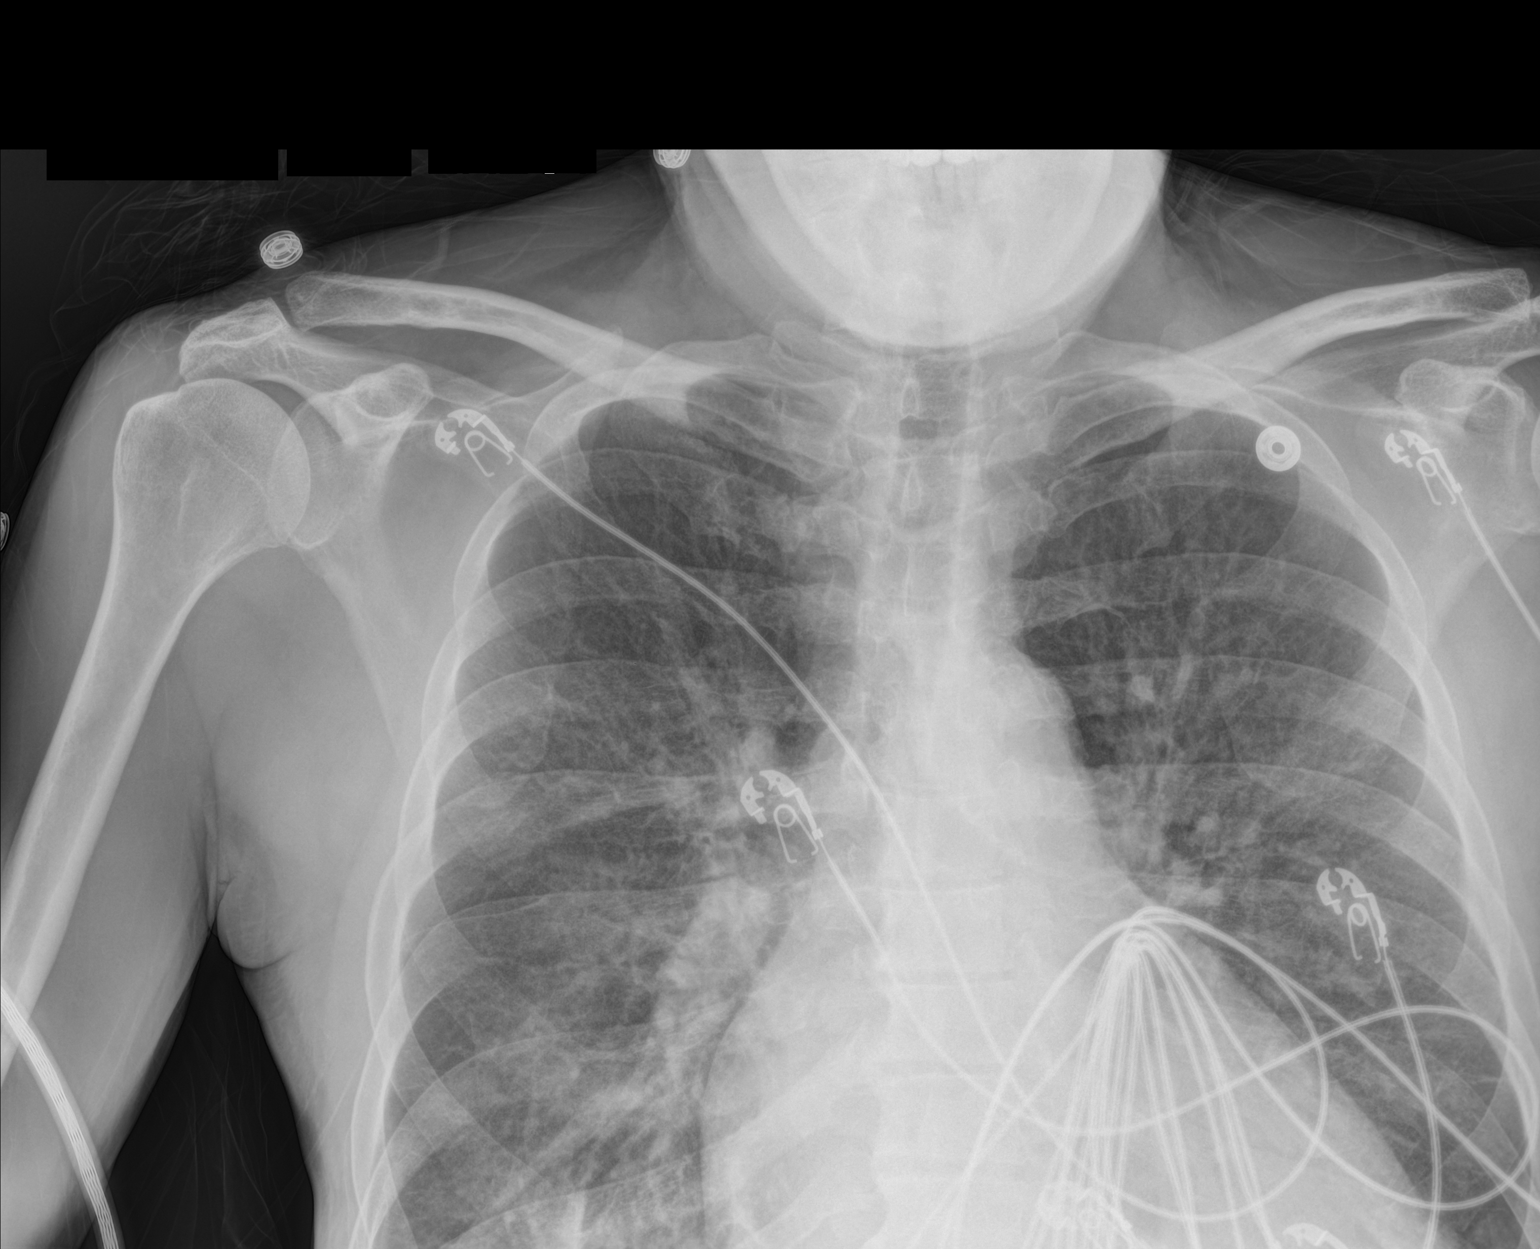
[im 2/2]
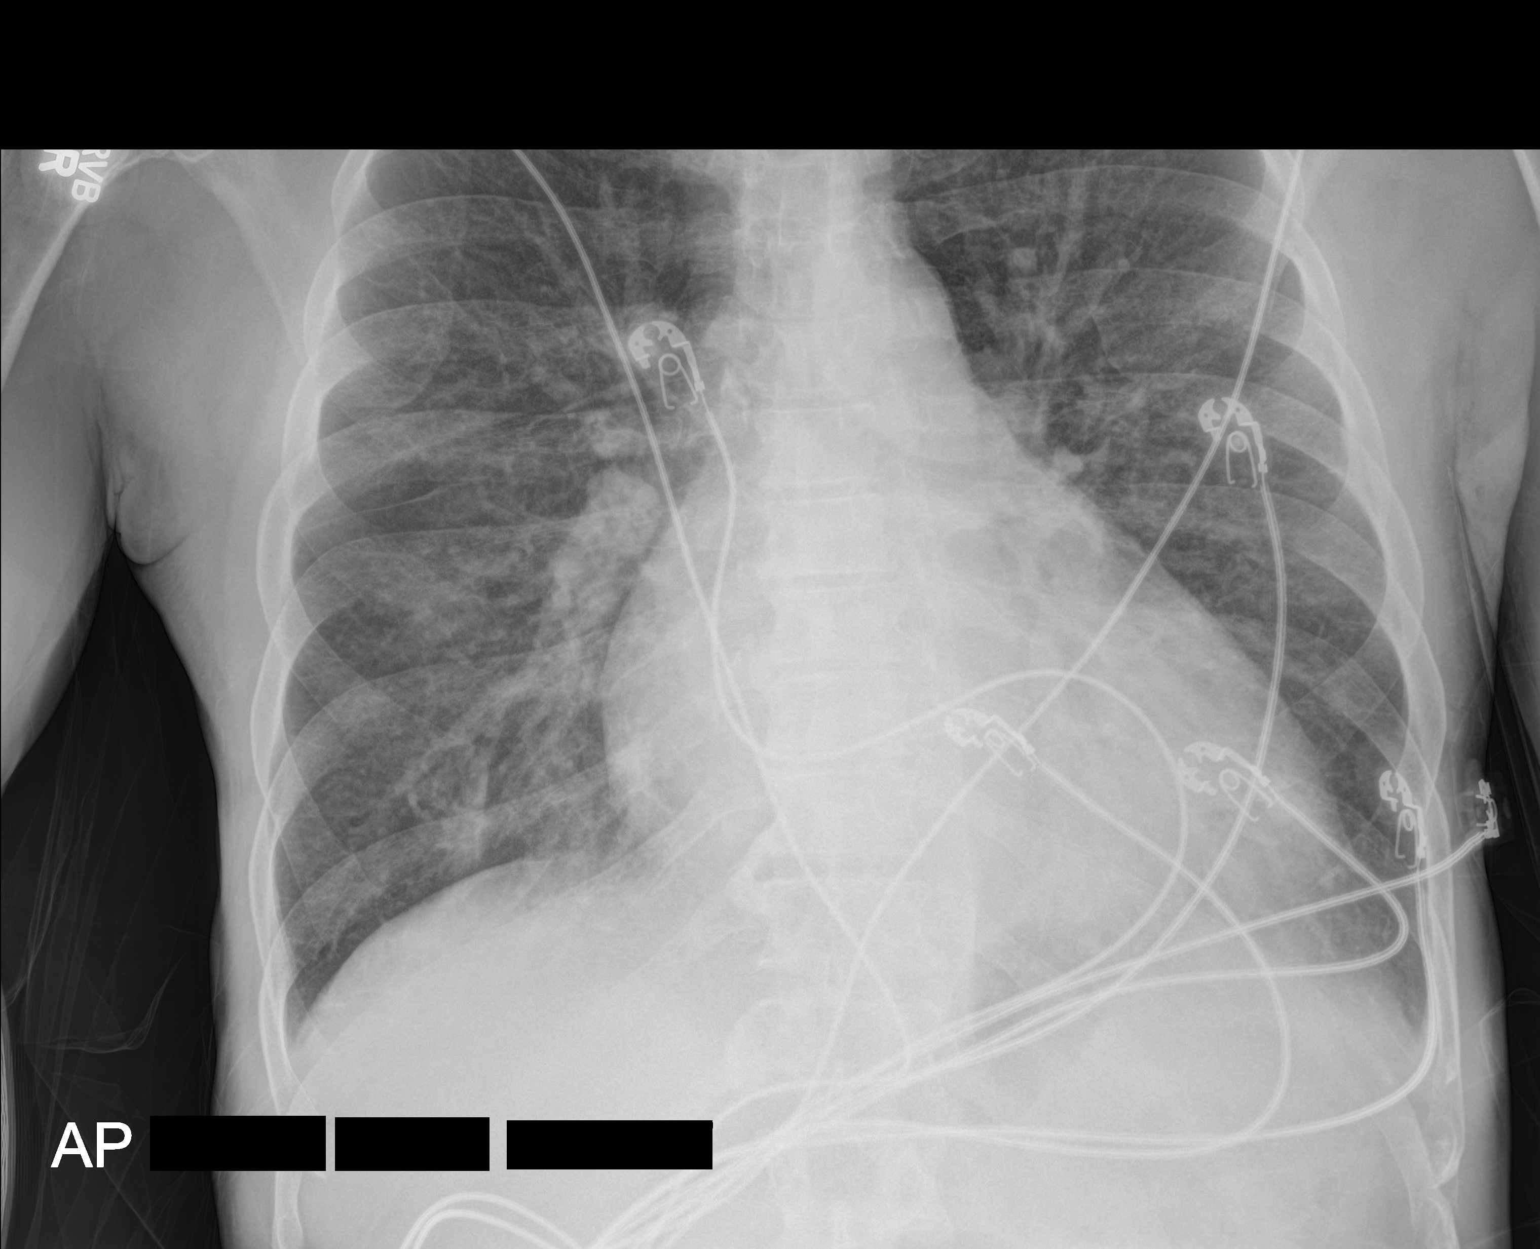

[2 of 2 positions shown; findings below may reference images not displayed]

FINDINGS: The lower lungs are excluded from the field of view limiting
evaluation.

There is bilateral mild interstitial thickening. There is no focal
parenchymal opacity. There is no pleural effusion or pneumothorax.
The heart and mediastinal contours are unremarkable.

The osseous structures are unremarkable.
IMPRESSION: Mild bilateral interstitial thickening which may reflect
interstitial infection versus interstitial edema.

## 2015-12-13 IMAGING — CT CT HEAD W/O CM
2 series · 15 of 30 positions shown, 19 images · non-contrast
Comparison: None.

CLINICAL DATA: LEFT-sided facial droop and LEFT-sided weakness
noted at approximately 4866 hours earlier today. Initial encounter.

EXAM:
CT HEAD WITHOUT CONTRAST
TECHNIQUE: Contiguous axial images were obtained from the base of the skull
through the vertex without intravenous contrast.

[Series 2: head w/o · axial · non-contrast · 0.45mm/px · z∈[+779,+899]mm · 13 of 28 slices shown, 17 images]
[im 2/28  brain]
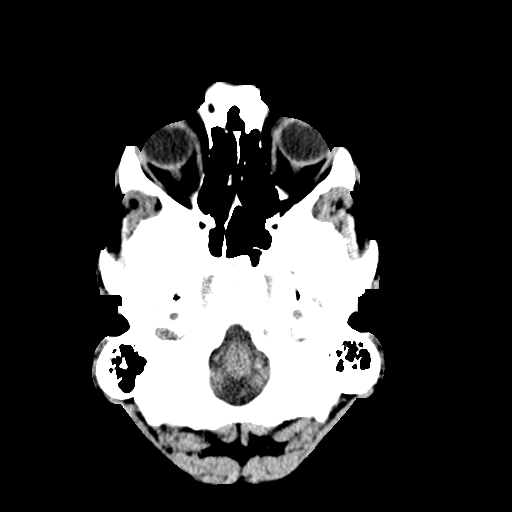
[im 2/28  bone]
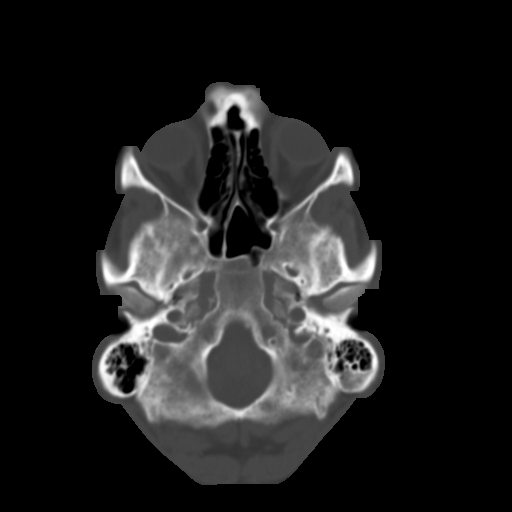
[im 4/28  brain]
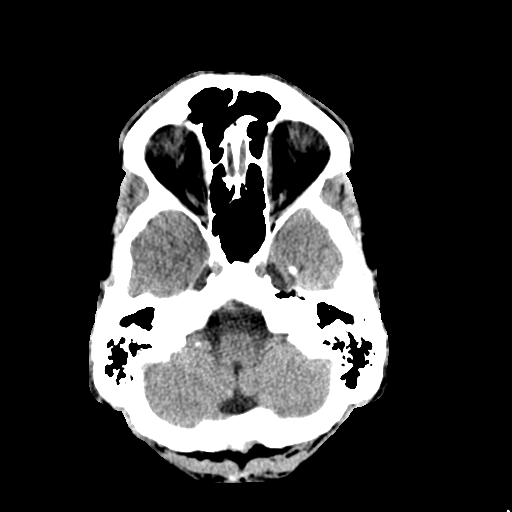
[im 6/28  brain]
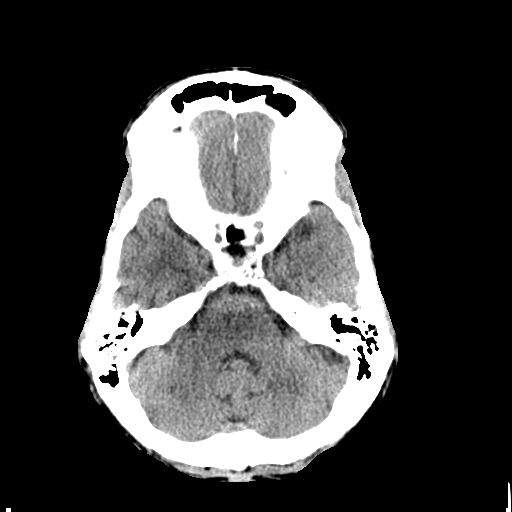
[im 8/28  brain]
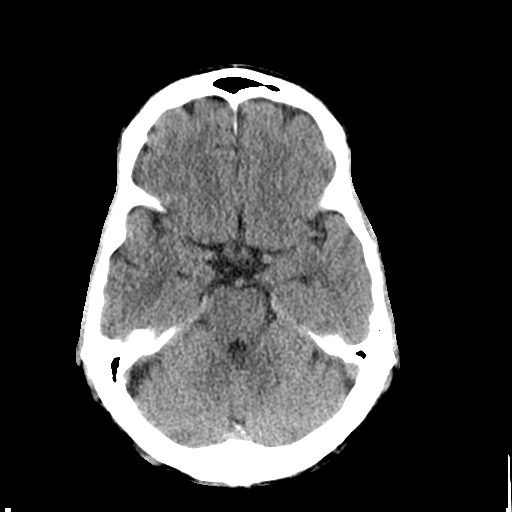
[im 10/28  brain]
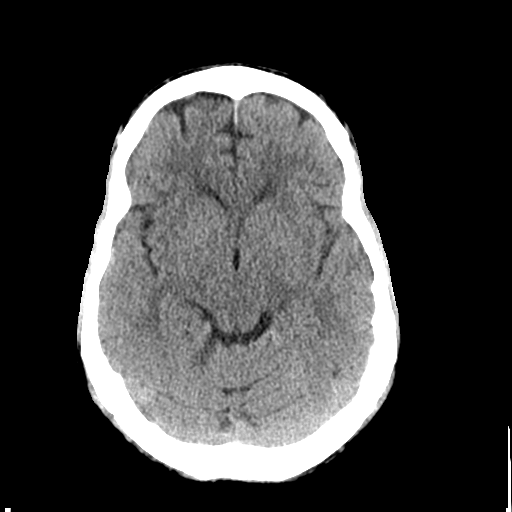
[im 10/28  bone]
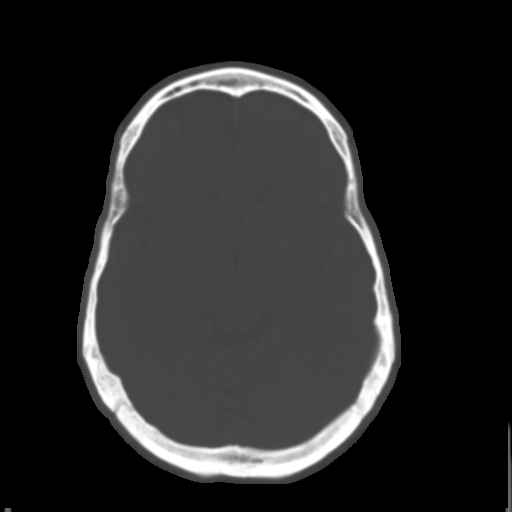
[im 12/28  brain]
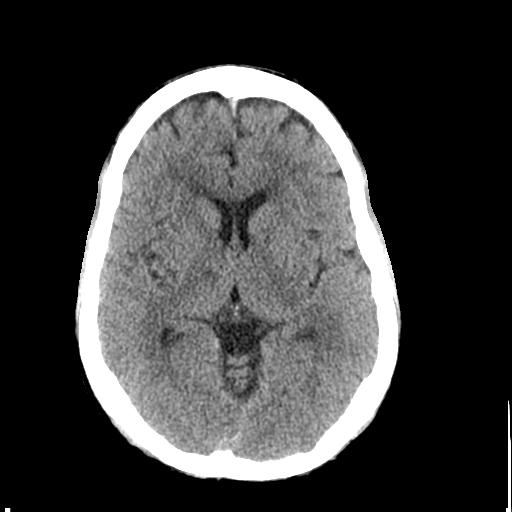
[im 14/28  brain]
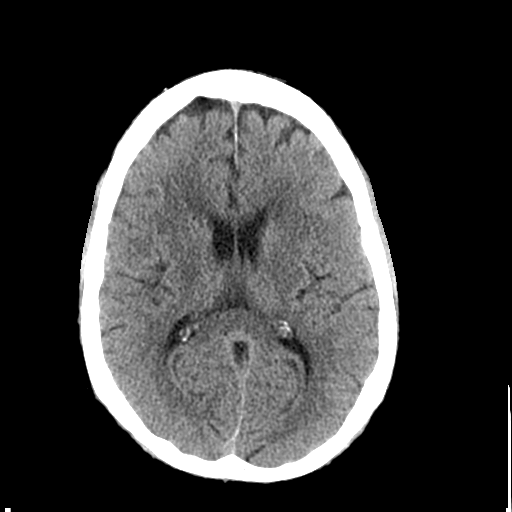
[im 16/28  brain]
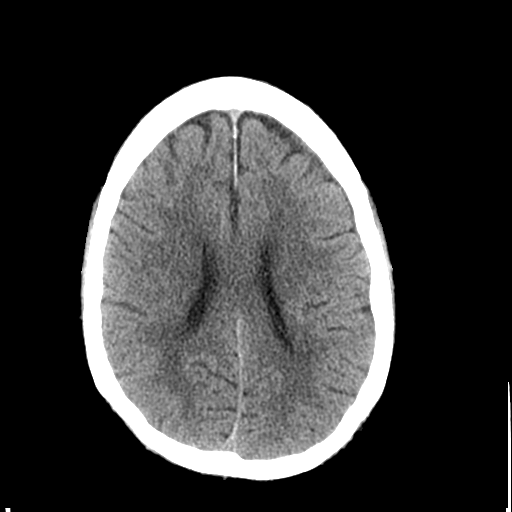
[im 18/28  brain]
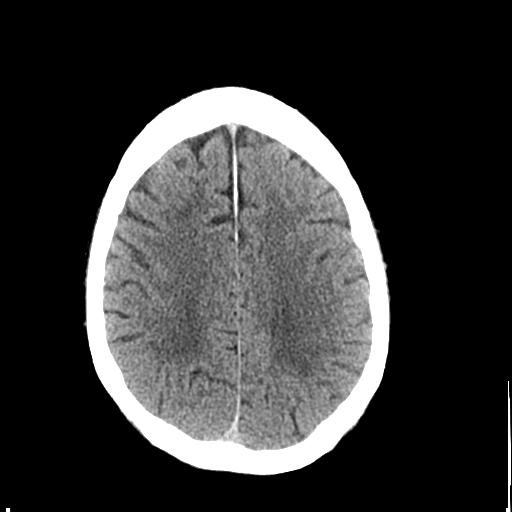
[im 18/28  bone]
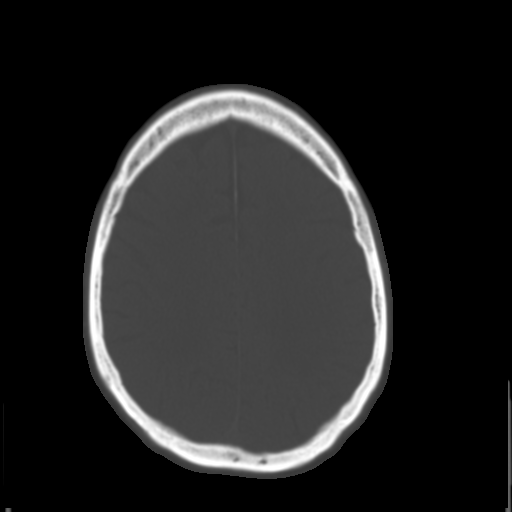
[im 20/28  brain]
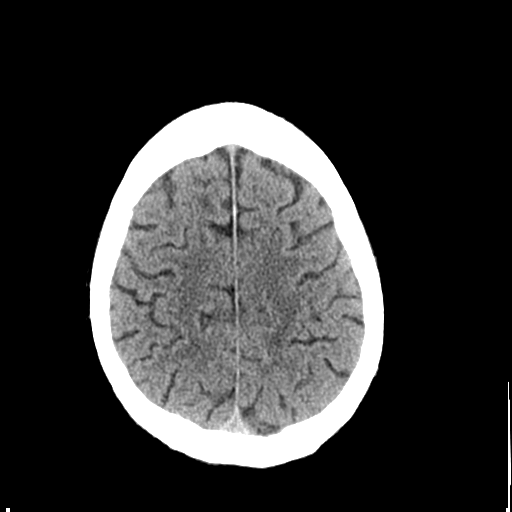
[im 22/28  brain]
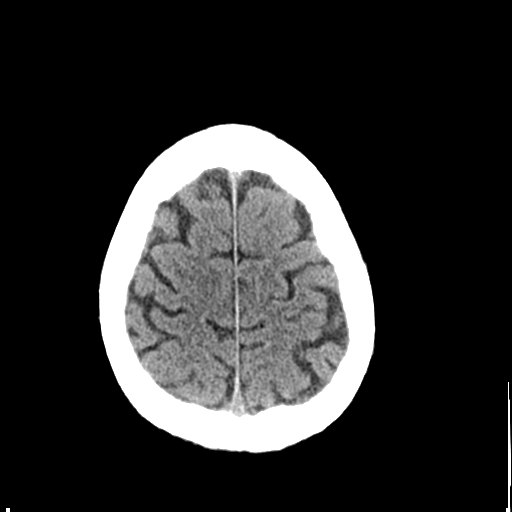
[im 24/28  brain]
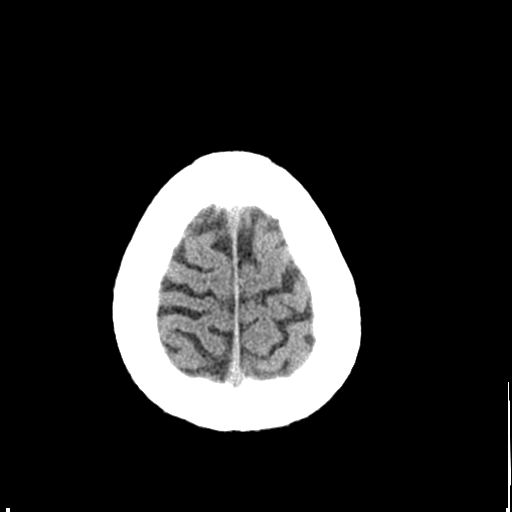
[im 26/28  brain]
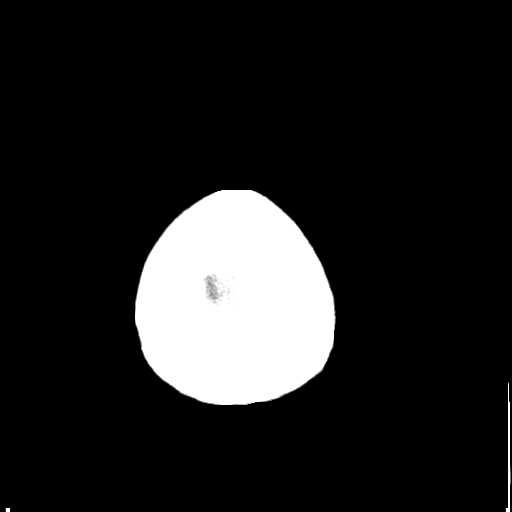
[im 26/28  bone]
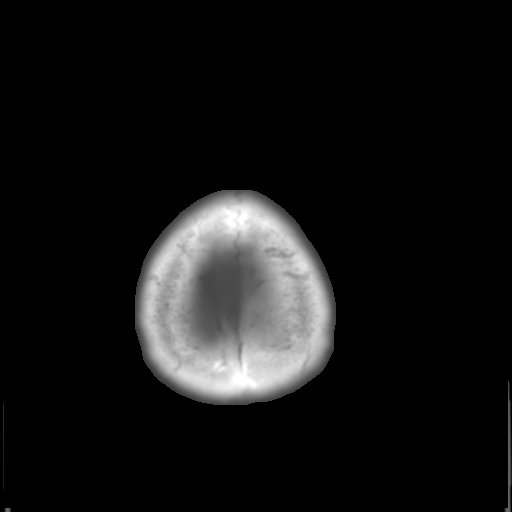

[Series 3: bone windows · axial · 0.45mm/px · z∈[+779,+799]mm · 2 of 28 slices shown]
[im 2/28  bone]
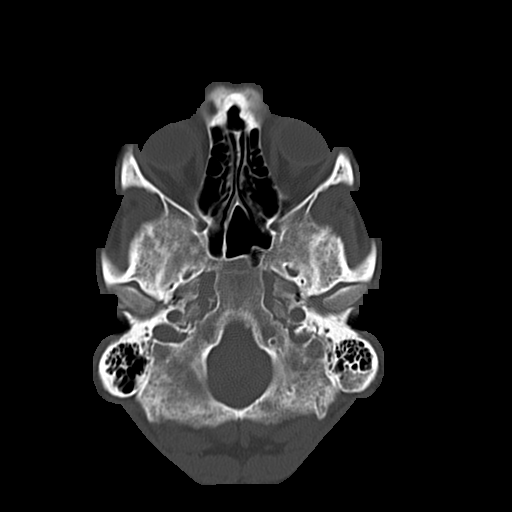
[im 6/28  bone]
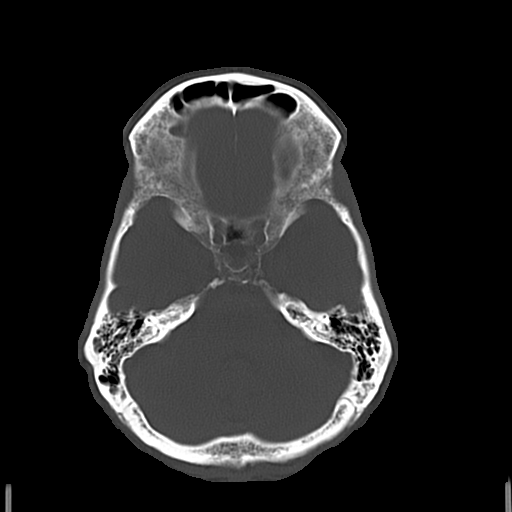

[15 of 30 positions shown; findings below may reference images not displayed]

FINDINGS: There is an asymmetric area of hypoattenuation RIGHT centrum
semiovale (see for instance image 15) which could represent an acute
infarct. Correlate clinically. If no contraindications, MRI could
confirm or exclude.

Similarly, there is a 1 cm sized area of asymmetric hypoattenuation
in the RIGHT anterolateral thalamus as seen on image 12. Correlate
clinically for LEFT-sided numbness. Confirm or exclude with MRI.

No hemorrhage, mass lesion, hydrocephalus, or extra-axial fluid.

Normal for age cerebral volume. Moderately advanced and likely
premature hypoattenuation of the periventricular and subcortical
white matter, consistent with chronic microvascular ischemic change.
Moderate vascular calcification in the carotid siphons.

Calvarium is intact.  There is no sinus or mastoid disease.
IMPRESSION: Two separate areas of asymmetric hypoattenuation as described in the
RIGHT thalamus and RIGHT centrum semiovale. In the appropriate
clinical setting, these may represent acute bland ischemia. See
discussion above.

Suspected small vessel disease of the supratentorial white matter.

## 2015-12-13 IMAGING — MR MR HEAD W/O CM
9 of 12 series · 32 of 48 positions shown · non-contrast
Comparison: Head CT without contrast 8663 hr today.

CLINICAL DATA: 53-year-old male with left facial droop and left
side weakness since [REDACTED]. Recent hypertensive emergency with
systolic blood pressure up to 200. Initial encounter.

EXAM:
MRI HEAD WITHOUT CONTRAST
MRA HEAD WITHOUT CONTRAST
TECHNIQUE: Multiplanar, multiecho pulse sequences of the brain and surrounding
structures were obtained without intravenous contrast. Angiographic
images of the head were obtained using MRA technique without
contrast.

[Series 3: T1 · sagittal · 5.0mm · 0.47mm/px · 2 of 24 slices shown]
[im 1/24]
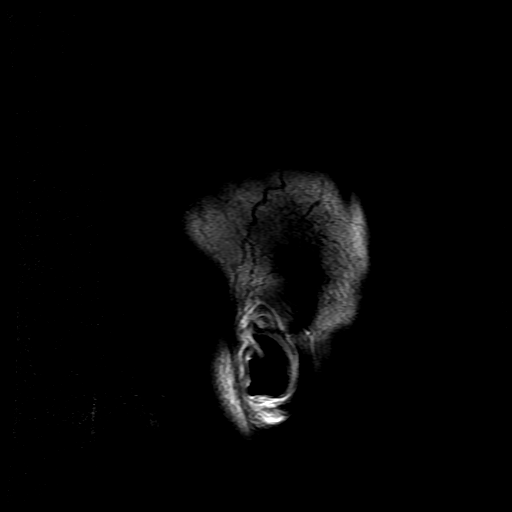
[im 24/24]
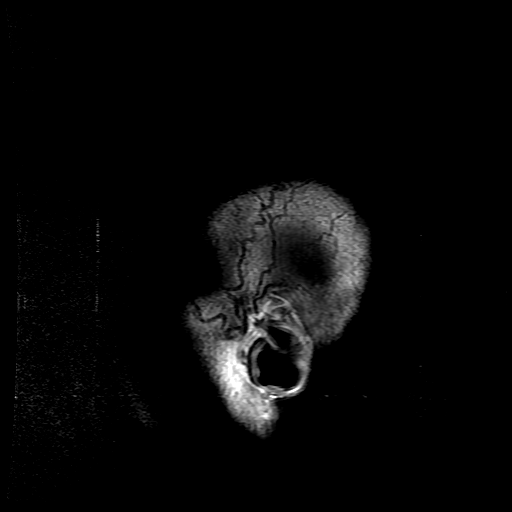

[Series 4: (id) mt fs · axial · 1.4mm · 0.39mm/px · z∈[-38,-13]mm · 3 of 138 slices shown]
[im 1/138]
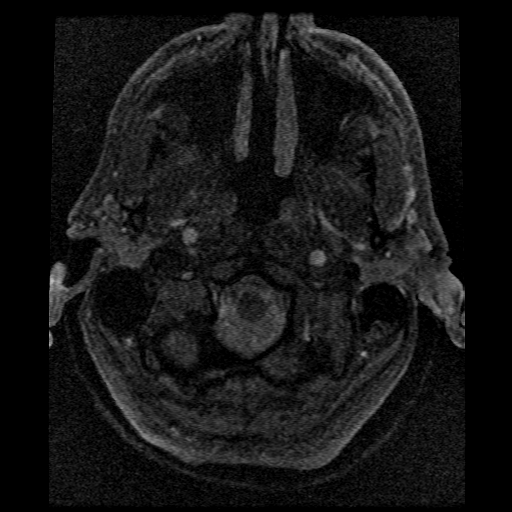
[im 25/138]
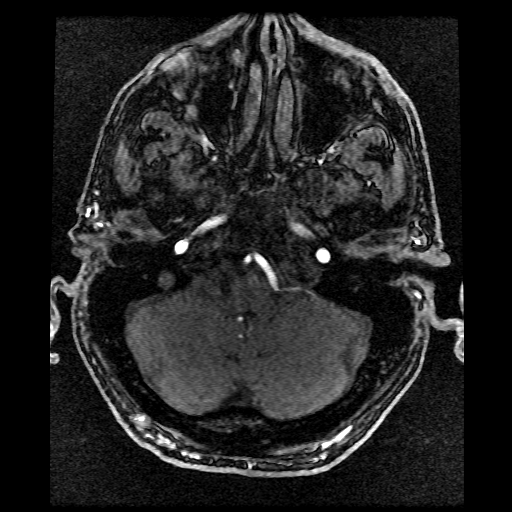
[im 38/138]
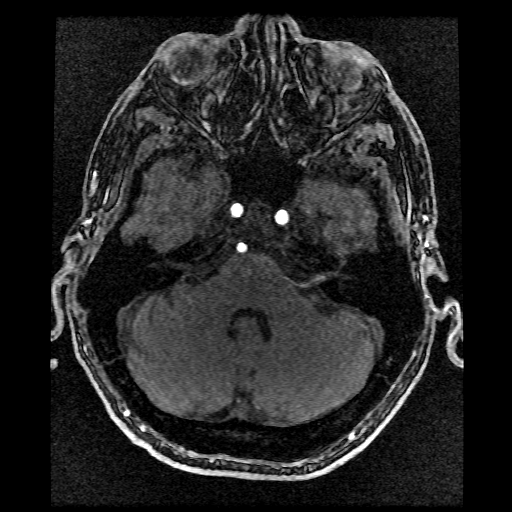

[Series 5: DWI · axial · 3.0mm · 1.09mm/px · z∈[-63,+83]mm · 8 of 100 slices shown (1 of 4)]
[im 1/100]
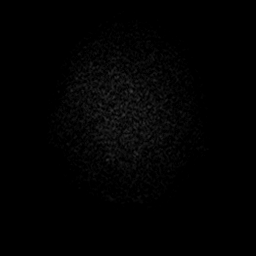
[im 15/100]
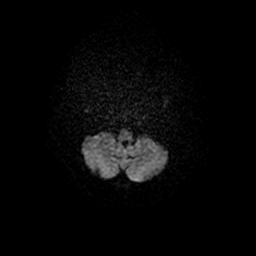
[im 29/100]
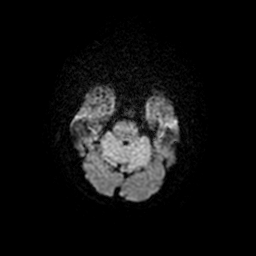
[im 43/100]
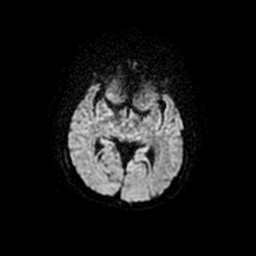
[im 57/100]
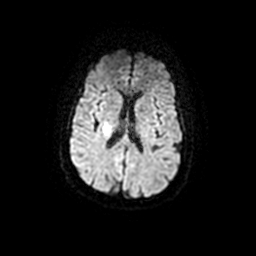
[im 71/100]
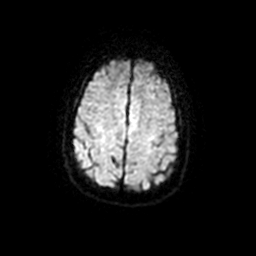
[im 85/100]
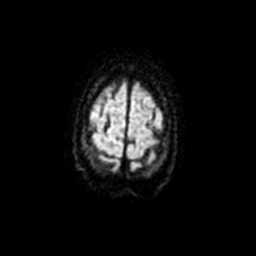
[im 100/100]
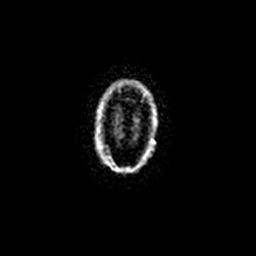

[Series 6: DWI · coronal · 5.0mm · 1.09mm/px · 6 of 74 slices shown (2 of 4)]
[im 1/74]
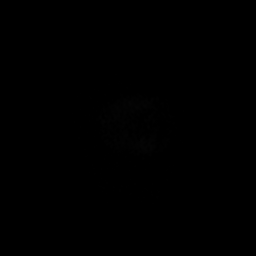
[im 15/74]
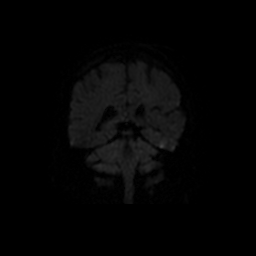
[im 30/74]
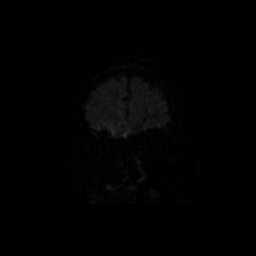
[im 44/74]
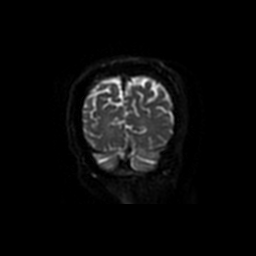
[im 59/74]
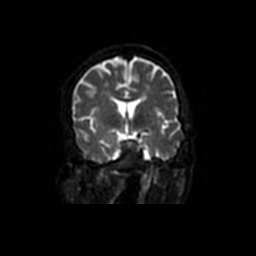
[im 74/74]
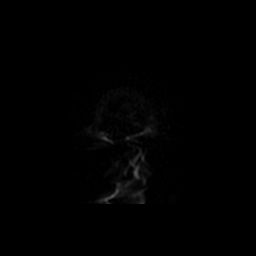

[Series 7: T2 · axial · 5.0mm · 0.43mm/px · z∈[-64,+85]mm · 2 of 24 slices shown]
[im 1/24]
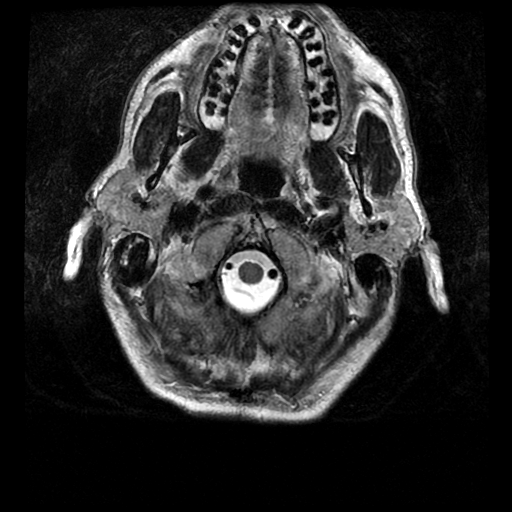
[im 24/24]
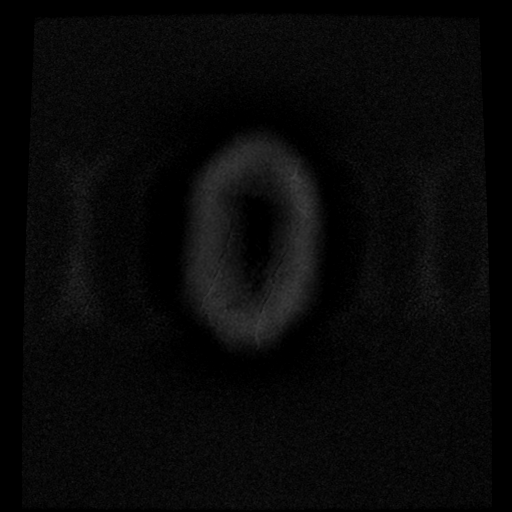

[Series 8: FLAIR · axial · 5.0mm · 0.43mm/px · z∈[-69,+91]mm · 2 of 24 slices shown]
[im 1/24]
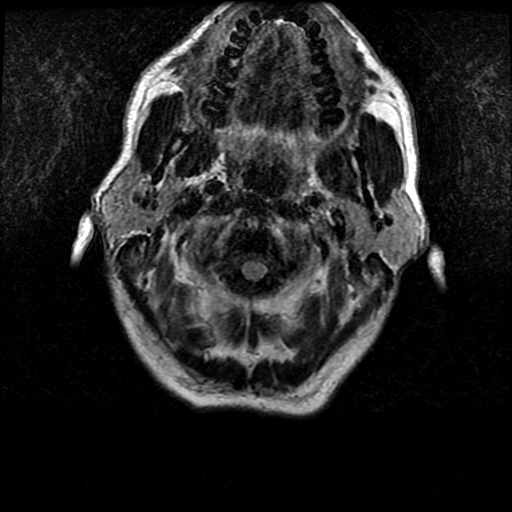
[im 24/24]
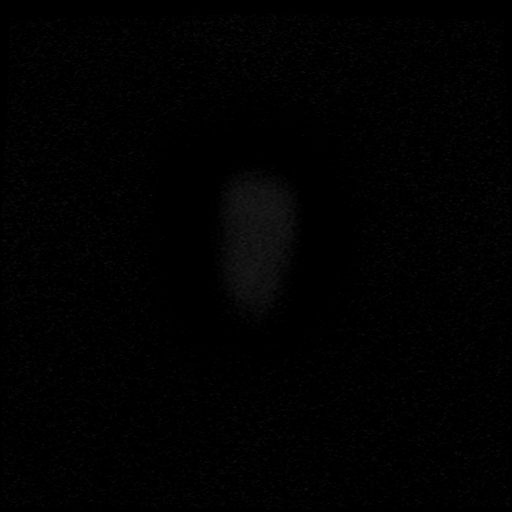

[Series 11: T2 post-contrast · coronal · 5.0mm · 0.45mm/px · 2 of 30 slices shown]
[im 1/30]
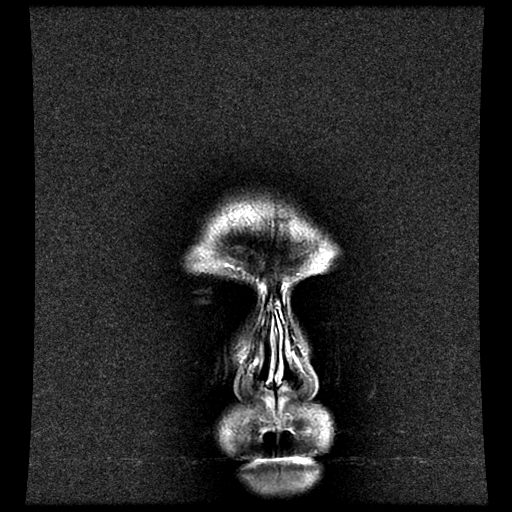
[im 30/30]
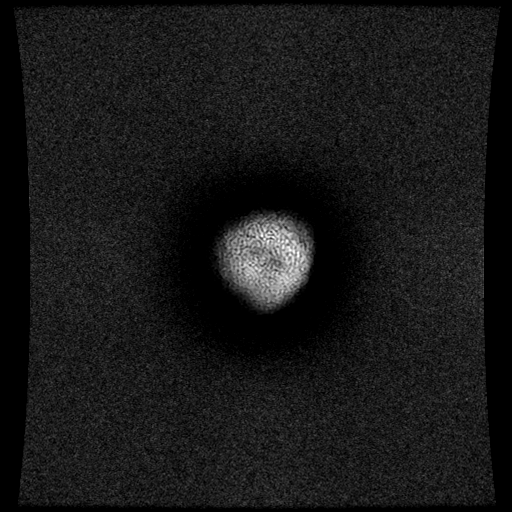

[Series 500: DWI · axial · 3.0mm · 1.09mm/px · z∈[-63,+83]mm · 4 of 48 slices shown (3 of 4)]
[im 1/48]
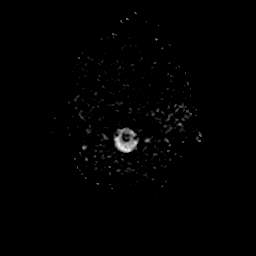
[im 16/48]
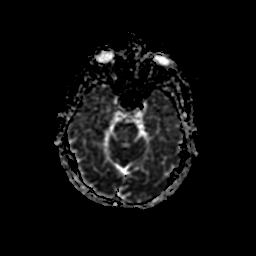
[im 32/48]
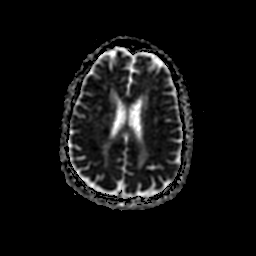
[im 48/48]
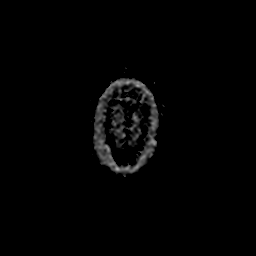

[Series 600: DWI · coronal · 5.0mm · 1.09mm/px · 3 of 37 slices shown (4 of 4)]
[im 1/37]
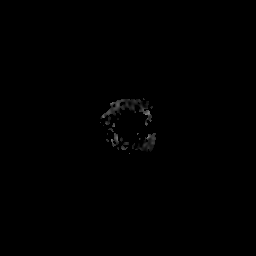
[im 19/37]
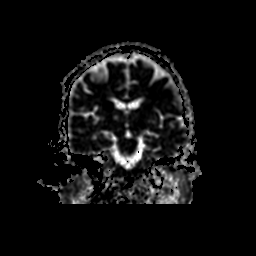
[im 37/37]
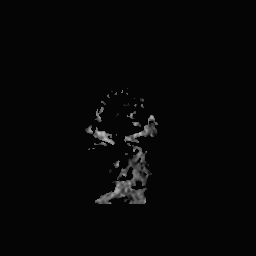

[32 of 48 positions shown; findings below may reference images not displayed]

FINDINGS: MRI HEAD FINDINGS

12 mm area of restricted diffusion in the posterior right corona
radiata tracking inferiorly toward the right posterior external
capsule. Mild T2 and FLAIR hyperintensity. No associated hemorrhage
or mass effect.

No other restricted diffusion. Major intracranial vascular flow
voids are preserved.

No midline shift, mass effect, evidence of mass lesion,
ventriculomegaly, extra-axial collection or acute intracranial
hemorrhage. Cervicomedullary junction and pituitary are within
normal limits. Patchy and confluent bilateral cerebral white matter
T2 and FLAIR hyperintensity. Small chronic lacunar infarcts in the
bilateral deep gray matter nuclei, sparing the right basal ganglia.
Patchy T2 hyperintensity in the brainstem, and involving the right
cerebellar peduncle. There is also some patchy left cerebellar
hemisphere signal abnormality (series 8, image 5). No cortical
encephalomalacia. No definite chronic cerebral blood products
identified.

Grossly negative visualized cervical spine. Normal bone marrow
signal. Visible internal auditory structures appear normal. Mastoids
are clear. Mild maxillary sinus mucosal thickening. Orbits soft
tissues appear normal negative scalp soft tissues.

MRA HEAD FINDINGS

Antegrade flow in the posterior circulation. Mildly dominant distal
left vertebral artery. Normal right PICA origin. Dominant appearing
left AICA. Patent vertebrobasilar junction. No basilar artery
stenosis. SCA and PCA origins are normal. Diminutive posterior
communicating arteries. Moderate irregularity and stenosis of the
left PCA P2 segment. Mild to moderate irregularity of the distal
bilateral PCA branches.

Antegrade flow in both ICA siphons. No siphon stenosis. Ophthalmic
and posterior communicating artery origins are within normal limits.
Patent carotid termini. Normal MCA and ACA origins. Anterior
communicating artery and visualized bilateral ACA branches are
within normal limits (somewhat motion degraded).

Mild irregularity of the left MCA M1 segment and visualized left MCA
branches. Mild motion degradation. Similar motion affecting the
right MCA branches. The right M1 segment is within normal limits.
The right MCA bifurcation is patent. No major right MCA branch
occlusion is identified.
IMPRESSION: 1. Small acute lacunar infarct of the right corroborate a cauda. No
mass effect or hemorrhage.
2. Intracranial atherosclerosis most pronounced in the PCA branches.
No right MCA branch stenosis or occlusion identified to correspond
to #1.
3. Additional signal abnormality in the brain may also reflect
chronic small vessel disease. However, cerebellar involvement,
including right cerebellar peduncle involvement, is noted and can
also be seen with chronic demyelinating disease.

## 2015-12-17 ENCOUNTER — Ambulatory Visit (INDEPENDENT_AMBULATORY_CARE_PROVIDER_SITE_OTHER): Payer: Self-pay | Admitting: Family Medicine

## 2015-12-17 ENCOUNTER — Encounter: Payer: Self-pay | Admitting: Family Medicine

## 2015-12-17 VITALS — BP 160/75 | HR 81 | Temp 97.8°F | Resp 12 | Ht 67.0 in | Wt 137.1 lb

## 2015-12-17 DIAGNOSIS — M25512 Pain in left shoulder: Secondary | ICD-10-CM

## 2015-12-17 DIAGNOSIS — I6930 Unspecified sequelae of cerebral infarction: Secondary | ICD-10-CM

## 2015-12-17 DIAGNOSIS — Z1211 Encounter for screening for malignant neoplasm of colon: Secondary | ICD-10-CM

## 2015-12-17 DIAGNOSIS — K922 Gastrointestinal hemorrhage, unspecified: Secondary | ICD-10-CM

## 2015-12-17 DIAGNOSIS — I10 Essential (primary) hypertension: Secondary | ICD-10-CM

## 2015-12-17 MED ORDER — HYDRALAZINE HCL 25 MG PO TABS: 50.0000 mg | ORAL_TABLET | Freq: Three times a day (TID) | ORAL | Status: DC

## 2015-12-17 NOTE — Patient Instructions (Addendum)
A few things to remember from today's visit:   1. Hypertension, essential Medication adjusted. Blood pressure goal < 140/90. Elevated blood pressure increases the risk of strokes, heart and kidney disease, and eye problems. Regular physical activity and a healthy diet (DASH diet) usually help. Low salt diet. Take medications as instructed. Caution with some over the counter medications as cold medications, dietary products (for weight loss), and Ibuprofen or Aleve (frequent use);all these medications could cause elevation of blood pressure.  Rest of meds unchanged. - hydrALAZINE (APRESOLINE) 25 MG tablet; Take 2 tablets (50 mg total) by mouth 3 (three) times daily.  Dispense: 90 tablet; Refill: 2  2. Left shoulder pain Physical therapy will be arranged.  3. Special screening for malignant neoplasms, colon Colonoscopy will be arranged.  4. History of CVA with residual deficit  If another episode of black stools a upper scope is going to be needed. No labs today.    If you sign-up for My chart, you can communicate easier with [place] in case you have any question or concern.  Stay healthy though a healthy diet, moderation is the key; and a regular physical activity (at least 5 times per week).

## 2015-12-17 NOTE — Progress Notes (Signed)
Subjective:    Patient ID: Thomas Robinson, male    DOB: 1961-07-29, 55 y.o.   MRN: [id number]  HPI  Mr.  Thomas Robinson is a 55 y.o.male here today to establish care with me and to follow on some of his chronic medical problems.  He has Hx of HTN,CKD,s/p CVA,and Hx of alcohol abuse (according to hospital records). Hx of asthma, he needs Albuterol inh seldom, has not needed it in years. He has not had a PCP in many years. He is here today to establish with me because it was recommended by his nephrologists.   He lived with male friend, who helps him with arranging his medications. He works full time, Electrical engineer." Today he mentions fatigue, which he has had since he had CVA in 04/2015, stable. He has occasional insomnia, takes OTC ZZZQuil as needed. He denies any Hx of depression or anxiety. In general he eats healthy, has not resumed regular exercise since he was discharged from hospital.  Today he is c/o left shoulder pain, which he has had since CVA but worse after a fall in 08/2015.He did not seek ER medical evaluation, has improved some with acupuncture.He also has left UE joint siffeness and achyness. Left hip pain resolved with acupuncture. Limitation of ROM, in general he states that pain "is not bad" except with certain movements but relieved with rest. He is not taking any OTC pain analgesic medication. Pain is not radiated, achy or sharp, intermittently. + Numbness on fingers, bilateral, residual from CVA as well as weakness LUE.  Hypertension: Dx in 04/2015 when he presented to the ER c/o left side weakness. Currently he is on Amlodipine 10 mg daily, Hydralazine 25 mg bid, and Metoprolol . He istaking medications as instructed, no side effects reported.  He has not noted unusual headache, visual changes, exertional chest pain, dyspnea,  focal weakness, or edema. BP at home most of the time "high."  04/2015 CVA, residual left UE weakness, occasional LLE weakness. Also reports LLE  involuntary movement when in bed, "jumping" after CVA and has improved since he started acupuncture.No associated urine/fecal incontinence or MS changes. Refused PT after hospital discharge.  Brain [id number] acute lacunar infarct of the right corroborate a cauda. No mass effect or hemorrhage.Intracranial atherosclerosis most pronounced in the PCA branches. 3. Additional signal abnormality in the brain may also reflect chronic small vessel disease. However, cerebellar involvement, including right cerebellar peduncle involvement, is noted and can also be seen with chronic demyelinating disease. He had negative work-up during hospitalization.  He is on Lipitor 10 mg daily and Aspirin 81 mg.  -CKD: ? IV, in 04/2015 he had Dx of AKI. Follows with nephrologists every 2 months. Labs done a month ago. He denies gross hematuria, decreased urine output, edema, or foam in urine. + Nocturia x 2, attributes it to drinking water at bedtime. He is on lasix 80 mg daily, not sure about indication, started during hospitalization, 04/2015.   Independent ADL's and IADL's.  - He also mentions an episode pf "black stools" and blood smell about 2 weeks ago. He states that he "overmedicated" himself, accidentally took 4 doses of his meds, including Aspirin. He also had RUQ and epigastric pain, nausea, and vomiting.He denies rectal bleed or hematemesis. All these symptom resolved. He has not had colonoscopy. No changes in bowel habits.   Lab Results  Component Value Date   CREATININE 4.96* 04/12/2015   BUN 54* 04/12/2015   NA 141 04/12/2015   K  3.7 04/12/2015   CL 105 04/12/2015   CO2 24 04/12/2015   Lab Results  Component Value Date   CHOL 162 04/11/2015   HDL 45 04/11/2015   LDLCALC 92 04/11/2015   TRIG 125 04/11/2015   CHOLHDL 3.6 04/11/2015     Review of Systems  Constitutional: Positive for fatigue. Negative for fever, diaphoresis, activity change, appetite change and unexpected weight change.   HENT: Negative for facial swelling, nosebleeds, sore throat and trouble swallowing.   Eyes: Negative for redness and visual disturbance.  Respiratory: Negative for apnea, cough, shortness of breath and wheezing.   Cardiovascular: Negative for chest pain, palpitations and leg swelling.  Gastrointestinal: Negative for nausea, vomiting, abdominal pain and anal bleeding.  Genitourinary: Negative for dysuria, hematuria and decreased urine volume.       Nocturia x 2, chronic  Musculoskeletal: Positive for arthralgias. Negative for back pain, gait problem and neck stiffness.  Skin: Negative for rash.  Neurological: Positive for weakness and numbness. Negative for dizziness, seizures, speech difficulty and headaches.  Psychiatric/Behavioral: Positive for sleep disturbance (occasional insomnia). Negative for hallucinations, behavioral problems and confusion.    Current Outpatient Prescriptions on File Prior to Visit  Medication Sig Dispense Refill  . albuterol (PROVENTIL HFA;VENTOLIN HFA) 108 (90 BASE) MCG/ACT inhaler Inhale 1-2 puffs into the lungs every 6 (six) hours as needed for wheezing or shortness of breath. 1 Inhaler 0  . amLODipine (NORVASC) 10 MG tablet Take 1 tablet (10 mg total) by mouth daily. 30 tablet 0  . aspirin 81 MG chewable tablet Chew 1 tablet (81 mg total) by mouth daily.    [name] atorvastatin (LIPITOR) 10 MG tablet Take 1 tablet (10 mg total) by mouth daily at 6 PM. 30 tablet 0  . DiphenhydrAMINE HCl (ZZZQUIL) 50 MG/30ML LIQD Take 30 mLs by mouth at bedtime as needed (for sleep).    . furosemide (LASIX) 80 MG tablet Take 1 tablet (80 mg total) by mouth daily. 30 tablet 0  . metoprolol (LOPRESSOR) 50 MG tablet Take 1 tablet (50 mg total) by mouth 2 (two) times daily. 60 tablet 0  . acetaminophen (TYLENOL) 500 MG tablet Take 1,000 mg by mouth every 4 (four) hours as needed for mild pain or headache. Reported on 12/17/2015    . Fluticasone-Salmeterol (ADVAIR) 100-50 MCG/DOSE AEPB  Inhale 1 puff into the lungs daily as needed (for shortness of breath). Reported on 12/17/2015     No current facility-administered medications on file prior to visit.     Past Medical History  Diagnosis Date  . Hypertension   . Asthma   . Stroke ([place])   . Chronic kidney disease   . Hyperlipidemia     Social History   Social History  . Marital Status: Divorced    Spouse Name: N/A  . Number of Children: N/A  . Years of Education: N/A   Social History Main Topics  . Smoking status: Never Smoker   . Smokeless tobacco: Never Used  . Alcohol Use: No     Comment: fORMER DRINKER  . Drug Use: No  . Sexual Activity: Not Asked   Other Topics Concern  . None   Social History Narrative   family history includes Cancer in his mother.   Filed Vitals:   12/17/15 0948  BP: 160/75  Pulse: 81  Temp: 97.8 F (36.6 C)  Resp: 12   Body mass index is 21.47 kg/(m^2). SpO2 Readings from Last 3 Encounters:  12/17/15 99%  04/12/15 97%  03/23/13 97%        Objective:   Physical Exam  Constitutional: He is oriented to person, place, and time. He appears well-developed and well-nourished. No distress.  HENT:  Head: Atraumatic.  Eyes: Conjunctivae and EOM are normal. Pupils are equal, round, and reactive to light.  Neck: Neck supple. No thyromegaly present.  Cardiovascular: Normal rate and regular rhythm.   Murmur (? soft SEM apex, one extrabeat) heard. Pulmonary/Chest: Effort normal. He has no wheezes. He has no rales.  Abdominal: Soft. He exhibits no distension and no mass. There is no tenderness.  Musculoskeletal: He exhibits no edema.       Left shoulder: He exhibits decreased range of motion and tenderness. He exhibits no effusion, no crepitus, no deformity and normal pulse.  L shoulder: No deformity, edema, or erythema appreciated. + Mild muscle atrophy. Luan Pulling' test pos, drop arm rotator cuff test pos, empty can supraspinatus test mild pos, cross body adduction test  pos, lift-Off Subscapularis test pos. ROM yes,90 degree (passive and active).  Lymphadenopathy:    He has no cervical adenopathy.  Neurological: He is alert and oriented to person, place, and time. He displays atrophy (LUE muscles). He displays no tremor. No cranial nerve deficit. Coordination and gait normal.  Reflex Scores:      Patellar reflexes are 2+ on the right side and 2+ on the left side. Mild weakness left upper extremity. LUE 4/5, hyperreflexia (biceps). Stable gait with no assistance.  Skin: Skin is warm. No rash noted.  Psychiatric: He has a normal mood and affect. His speech is not slurred. Cognition and memory are normal.  Poor grooming (mild), good eye contact. Slow speech.      Assessment & Plan:    Remo Lipps was seen today for establish care.  Diagnoses and all orders for this visit:  Hypertension, essential Not well controlled. Hydralazine dose increased from 25 mg bid to 50 mg tid. Rest unchanged. Some side effects from meds discussed. DASH diet recommended. Possible complications of elevated BP discussed. Annual eye examination. Instructed about warning signs. F/U in 2 months.   -     hydrALAZINE (APRESOLINE) 25 MG tablet; Take 2 tablets (50 mg total) by mouth 3 (three) times daily.  Left shoulder pain Could be related with CVA + rotator cuff injury. For now PT recommended, he agrees with trying. In regard to pain, Acetaminophen 500 mg qid (max 2600 mg/day). Topical OTC medications with Lidocaine may also help. We may need to discuss chronic pain management depending of pain level and improvement after PT, since he cannot take NSAID's.   -     Ambulatory referral to Physical Therapy  Special screening for malignant neoplasms, colon Agrees with colonoscopy, so referral placed.  -     Ambulatory referral to Gastroenterology  History of CVA with residual deficit Residual mild left extremities weakness, independent. Secondary prevention discussed,  for now no changes in Aspirin, some side effects discussed. HTN must be better controlled. On statin.  Upper GI bleed One episodes, seemed related to Aspirin. He denies hx of alcoholism. Clearly instructed about warning signs. If another episode he is going to need EGD and possible Aspirin discontinuation.   I do not doing labs today, since he is reporting labs done a month ago, so we will try to obtain results and decide about further workup.    Shareeka Yim G. [name], MD  [facility]. [place] office.

## 2015-12-17 NOTE — Progress Notes (Signed)
Pre visit review using our clinic tool,if applicable. No additional management support is needed unless otherwise documented below in the visit note.  

## 2016-01-14 ENCOUNTER — Encounter: Payer: Self-pay | Admitting: Family Medicine

## 2016-01-14 ENCOUNTER — Ambulatory Visit (INDEPENDENT_AMBULATORY_CARE_PROVIDER_SITE_OTHER): Payer: Self-pay | Admitting: Family Medicine

## 2016-01-14 VITALS — BP 152/58 | HR 63 | Temp 98.0°F | Resp 12 | Ht 67.0 in | Wt 140.0 lb

## 2016-01-14 DIAGNOSIS — M7542 Impingement syndrome of left shoulder: Secondary | ICD-10-CM

## 2016-01-14 DIAGNOSIS — I10 Essential (primary) hypertension: Secondary | ICD-10-CM

## 2016-01-14 MED ORDER — HYDROCODONE-ACETAMINOPHEN 5-325 MG PO TABS: 1.0000 | ORAL_TABLET | Freq: Three times a day (TID) | ORAL | Status: AC | PRN

## 2016-01-14 MED ORDER — AMLODIPINE BESYLATE 10 MG PO TABS: 5.0000 mg | ORAL_TABLET | Freq: Every day | ORAL | Status: DC

## 2016-01-14 NOTE — Progress Notes (Signed)
Pre visit review using our clinic review tool, if applicable. No additional management support is needed unless otherwise documented below in the visit note. 

## 2016-01-14 NOTE — Patient Instructions (Addendum)
A few things to remember from today's visit:   1. Hypertension, essential  Resume Amlodipine but lower dose.Monitor for side effects. Re-checked 150/60.   Goal blood pressure < 140/90, ideally 130/85.  - amLODipine (NORVASC) 10 MG tablet; Take 0.5 tablets (5 mg total) by mouth daily.  Dispense: 30 tablet; Refill: 0  2. Shoulder impingement syndrome, left  PT pending. X Ray ordered.  Injection given , no complications, monitor for improvement if not any ortho will be arranged.If not any better in 1-2 weeks please let me know.   Hydrocodone at bedtime.   - HYDROcodone-acetaminophen (NORCO/VICODIN) 5-325 MG tablet; Take 1 tablet by mouth every 8 (eight) hours as needed for moderate pain.  Dispense: 21 tablet; Refill: 0 - DG Shoulder Left; Future      If you sign-up for My chart, you can communicate easier with [place] in case you have any question or concern.

## 2016-01-14 NOTE — Progress Notes (Addendum)
Subjective:    Patient ID: Thomas Robinson, male    DOB: 04-01-1961, 55 y.o.   MRN: [id number]  HPI   Thomas Robinson is a 55 y.o.male here today to follow on HTN and shoulder pain. I saw him 12/17/15, his BP was not well controlled, so antihypertensive meds were adjusted.  Dx with HTN in 04/2015 , s/p CVA with residual left-sided weakness. Currently he is supposed to be on Amlodipine 10 mg daily, Metoprolol Tartrate 50 mg bid, and Hydralazine was increased from 25 mg bid to 50 mg tid. He stopped all his medications 1-2 weeks ago. He states that he was waking up at night with RUQ pain, severe, no radiated, nausea and vomiting.He did not mention these symptoms last OV, which he attributed to his meds. All symptoms resolved after stopping his meds.  He also mentions rectal bleed, which he did mention last OV, resolved,GI referral was already placed. He denies constipation.    Not exercising due to pain but following a healthy/low salt diet. Home BP's: not checking, at his acupuncture appt SBP 170.   He has not noted unusual headache, visual changes, exertional chest pain,dyspnea,new focal weakness, or worsening edema.    Lab Results  Component Value Date   CREATININE 4.96* 04/12/2015   BUN 54* 04/12/2015   NA 141 04/12/2015   K 3.7 04/12/2015   CL 105 04/12/2015   CO2 24 04/12/2015    Lab Results  Component Value Date   CHOL 162 04/11/2015   HDL 45 04/11/2015   LDLCALC 92 04/11/2015   TRIG 125 04/11/2015   CHOLHDL 3.6 04/11/2015    Shoulder pain: He has had shoulder pain since CVA (04/2015)  worse after a fall in 08/2015. Last OV he reported that pain had improved some with acupuncture.Today he is reporting severe pain, no radiated, constant, aggravated with movement and relieved with rest. + Limitation ROM. PT referral was placed last OV, according to pt, he was told it may take 2-3 weeks to start. No recent trauma, states that he falls "all the time" since he had  his CVA due to left side weakness.He does not use a walker or cane. Hand numbness, fingers, stable since CVA. No new focal deficit. He is not taken any medication for pain. He would like imaging done as well as a "shot" in left shoulder.   Review of Systems  Constitutional: Positive for fatigue (no more than usual). Negative for fever, diaphoresis, appetite change and unexpected weight change.  HENT: Negative for facial swelling, nosebleeds, sore throat and trouble swallowing.   Eyes: Negative for photophobia and visual disturbance.  Respiratory: Negative for apnea, cough, shortness of breath and wheezing.   Cardiovascular: Negative for chest pain, palpitations and leg swelling.  Gastrointestinal: Negative for nausea, vomiting, abdominal pain and anal bleeding.       No changes in bowel habits.  Genitourinary: Negative for dysuria, frequency, hematuria and decreased urine volume.       Nocturia x 2, chronic  Musculoskeletal: Positive for arthralgias and gait problem (stable). Negative for neck pain and neck stiffness.  Skin: Negative for color change and rash.  Neurological: Positive for weakness and numbness. Negative for dizziness, seizures, speech difficulty and headaches.  Psychiatric/Behavioral: Negative for hallucinations, behavioral problems and confusion.       Current Outpatient Prescriptions on File Prior to Visit  Medication Sig Dispense Refill  . acetaminophen (TYLENOL) 500 MG tablet Take 1,000 mg by mouth every 4 (four) hours  as needed for mild pain or headache. Reported on 12/17/2015    . albuterol (PROVENTIL HFA;VENTOLIN HFA) 108 (90 BASE) MCG/ACT inhaler Inhale 1-2 puffs into the lungs every 6 (six) hours as needed for wheezing or shortness of breath. 1 Inhaler 0  . aspirin 81 MG chewable tablet Chew 1 tablet (81 mg total) by mouth daily.    [name] atorvastatin (LIPITOR) 10 MG tablet Take 1 tablet (10 mg total) by mouth daily at 6 PM. 30 tablet 0  . calcitRIOL (ROCALTROL) 0.25  MCG capsule Take 0.25 mcg by mouth daily.    . DiphenhydrAMINE HCl (ZZZQUIL) 50 MG/30ML LIQD Take 30 mLs by mouth at bedtime as needed (for sleep).    . Fluticasone-Salmeterol (ADVAIR) 100-50 MCG/DOSE AEPB Inhale 1 puff into the lungs daily as needed (for shortness of breath). Reported on 12/17/2015    . furosemide (LASIX) 80 MG tablet Take 1 tablet (80 mg total) by mouth daily. 30 tablet 0  . hydrALAZINE (APRESOLINE) 25 MG tablet Take 2 tablets (50 mg total) by mouth 3 (three) times daily. 90 tablet 2  . metoprolol (LOPRESSOR) 50 MG tablet Take 1 tablet (50 mg total) by mouth 2 (two) times daily. 60 tablet 0   No current facility-administered medications on file prior to visit.     Past Medical History  Diagnosis Date  . Hypertension   . Asthma   . Stroke ([place])   . Chronic kidney disease   . Hyperlipidemia     Social History   Social History  . Marital Status: Single    Spouse Name: N/A  . Number of Children: N/A  . Years of Education: N/A   Social History Main Topics  . Smoking status: Never Smoker   . Smokeless tobacco: Never Used  . Alcohol Use: No     Comment: fORMER DRINKER  . Drug Use: No  . Sexual Activity: Not Asked   Other Topics Concern  . None   Social History Narrative    Filed Vitals:   01/14/16 0809  BP: 152/58  Pulse: 63  Temp: 98 F (36.7 C)  Resp: 12   Body mass index is 21.92 kg/(m^2).      Objective:   Physical Exam  Constitutional: He is oriented to person, place, and time. He appears well-developed and well-nourished. He does not appear ill. He appears distressed (mild, due to pain).  HENT:  Head: Atraumatic.  Mouth/Throat: Oropharynx is clear and moist and mucous membranes are normal.  Eyes: Conjunctivae and EOM are normal. Pupils are equal, round, and reactive to light.  Cardiovascular: Normal rate and regular rhythm.   Murmur heard. Pulses:      Radial pulses are 2+ on the right side, and 2+ on the left side.       Dorsalis  pedis pulses are 2+ on the right side, and 2+ on the left side.  Soft SEM RUSB  Pulmonary/Chest: Effort normal. No respiratory distress. He has no wheezes. He has no rales.  Abdominal: Soft. He exhibits no distension and no mass. There is no hepatomegaly. There is no tenderness.  Musculoskeletal: He exhibits edema (Trace pitting edema LE bilateral. Right IP jojnts with edema, no erythema, mild limitation of flexion.) and tenderness.       Right shoulder: He exhibits decreased range of motion and tenderness.       Left shoulder: He exhibits decreased range of motion and tenderness. He exhibits no bony tenderness, no swelling, no effusion, no deformity and normal pulse.  Muscle atrophy LUE. Shoulder: No deformity, edema, or erythema appreciated. Luan Pulling' test pos, drop arm rotator cuff test pos, empty can supraspinatus test pos, cross body adduction test neg, lift-Off Subscapularis test not able to do. ROM limited, moderately.  Lymphadenopathy:    He has no cervical adenopathy.  Neurological: He is alert and oriented to person, place, and time. No cranial nerve deficit. Coordination normal.  Left upper and lower extremity mild weakness (residual after CVA) , stable gait with no assistance.  Skin: Skin is warm.  Psychiatric: His mood appears anxious.  Poor groomed, good eye contact.       Assessment & Plan:    Thomas Robinson was seen today for follow-up.  Diagnoses and all orders for this visit:  Hypertension, essential  Not well controlled. Possible complications of elevated BP discussed. Educated about possible complications from stopping BB abruptly. He agrees with resuming Amlodipine but lower dose, 5 mg daily, monitor for side effects. Monitor BP at home. F/U in 3 months.  He had labs at his nephrologist's office about 2 months ago, I still do not have results. Will order labs next OV.  -     amLODipine (NORVASC) 10 MG tablet; Take 0.5 tablets (5 mg total) by mouth daily.  Shoulder  impingement syndrome, left -     HYDROcodone-acetaminophen (NORCO/VICODIN) 5-325 MG tablet; Take 1 tablet by mouth every 8 (eight) hours as needed for moderate pain. -     DG Shoulder Left; Future  After verbal consent and discussion of risk and benefits he wishes to have injection. Methyl prednisone 40 mg + plain Lidocaine 1% 1.5 ml were injected into subacromial space after cleaning area with alcohol. He tolerated procedure well, no complications.10 min after: active ROM mildly improved as well as passive (al,ost 90 degrees), he also reports mild improvement in pain. ROM exercise tough and recommended, pending PT. Rx for short term Hydrocodone-Acetaminophen given, instructed to take it mainly at bedtime,some side effects discussed. Fall precautions. Will consider Cymbalta next OV. If not any better in 1-2 weeks, ortho referral will be arranged.     -Patient advised to return or notify a doctor immediately if symptoms worsen or persist or new concerns arise.     [name]. [name], MD  [facility]. [place] office.

## 2016-01-15 ENCOUNTER — Telehealth: Payer: Self-pay | Admitting: Family Medicine

## 2016-01-15 ENCOUNTER — Ambulatory Visit (INDEPENDENT_AMBULATORY_CARE_PROVIDER_SITE_OTHER)
Admission: RE | Admit: 2016-01-15 | Discharge: 2016-01-15 | Disposition: A | Discharge status: Home / Self Care | Payer: Self-pay | Source: Ambulatory Visit | Attending: Family Medicine | Admitting: Family Medicine

## 2016-01-15 DIAGNOSIS — M7542 Impingement syndrome of left shoulder: Secondary | ICD-10-CM

## 2016-01-15 NOTE — Telephone Encounter (Signed)
Error

## 2016-01-16 ENCOUNTER — Telehealth: Payer: Self-pay | Admitting: Family Medicine

## 2016-01-16 NOTE — Telephone Encounter (Signed)
Pt is returning julie call

## 2016-01-17 ENCOUNTER — Other Ambulatory Visit: Payer: Self-pay | Admitting: Family Medicine

## 2016-01-17 DIAGNOSIS — M25512 Pain in left shoulder: Secondary | ICD-10-CM

## 2016-01-17 MED ORDER — METHYLPREDNISOLONE ACETATE 80 MG/ML IJ SUSP: 40.0000 mg | Freq: Once | INTRAMUSCULAR | Status: DC

## 2016-01-17 NOTE — Addendum Note (Signed)
Addended by: [name], Shepard Keltz G on: 01/17/2016 01:18 PM   Modules accepted: Orders

## 2016-01-17 NOTE — Progress Notes (Signed)
Referral to ortho placed. Thomas Robinson

## 2016-01-17 NOTE — Telephone Encounter (Signed)
Notified pt of results 

## 2016-01-19 IMAGING — US US RENAL
1 series · 14 of 25 positions shown · non-contrast
Comparison: None.

ADDENDUM:
Not mentioned above are bilateral pleural effusions.
CLINICAL DATA: Acute kidney injury.

EXAM:
RENAL / URINARY TRACT ULTRASOUND COMPLETE

[Series 1: us renal · 0.22mm/px · 14 of 38 slices shown]
[im 1/38]
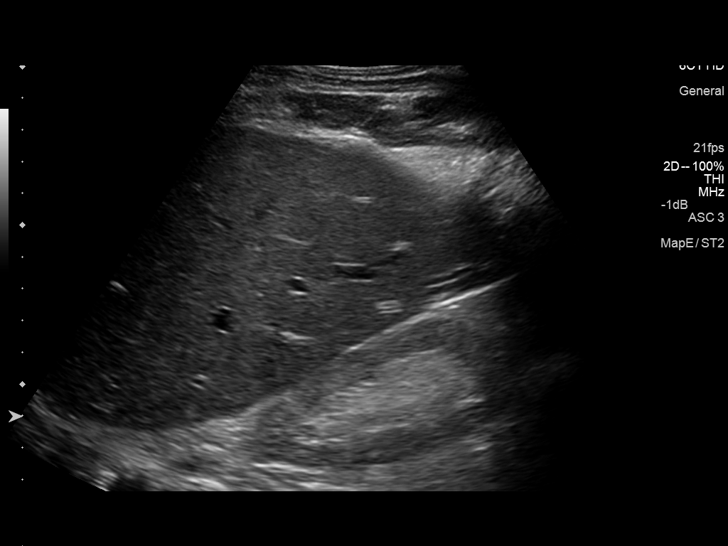
[im 4/38]
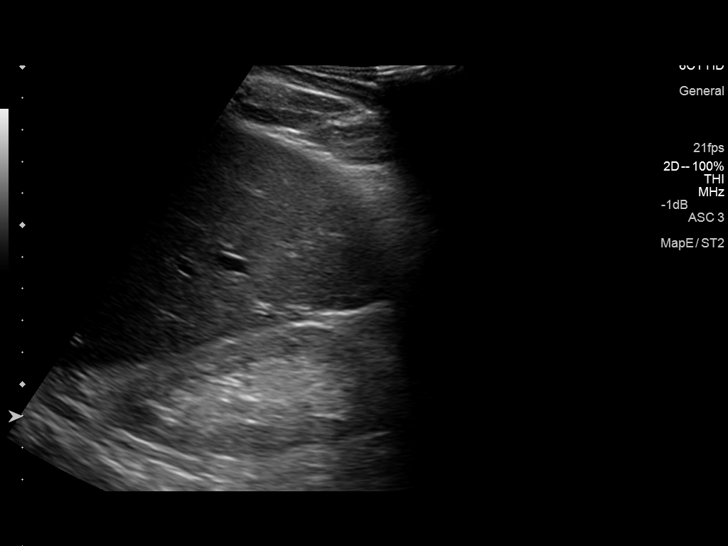
[im 7/38]
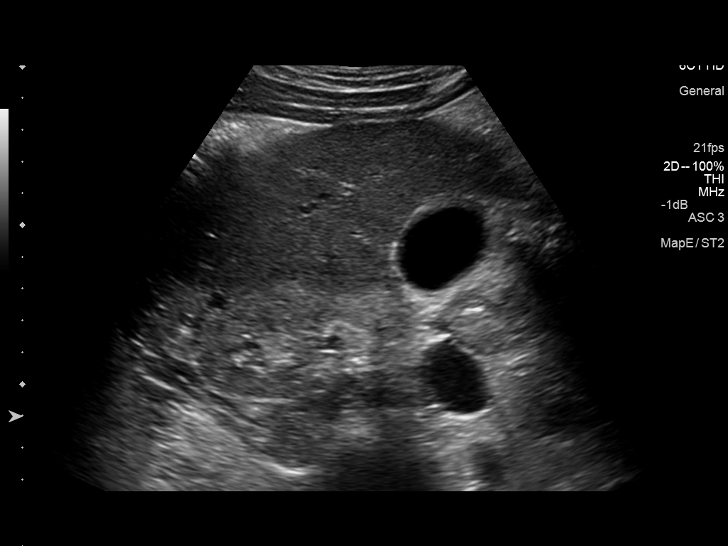
[im 10/38]
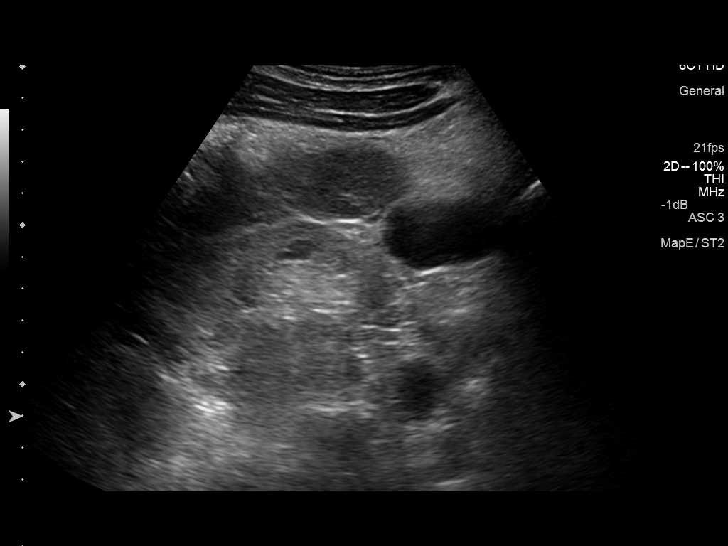
[im 13/38]
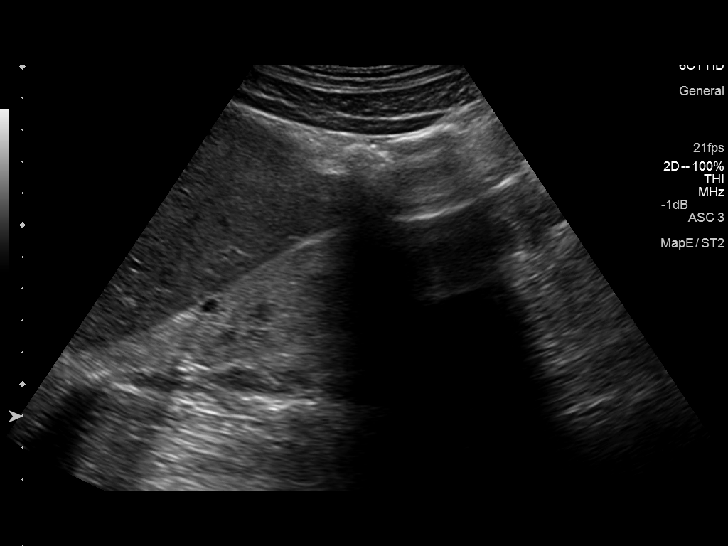
[im 14/38]
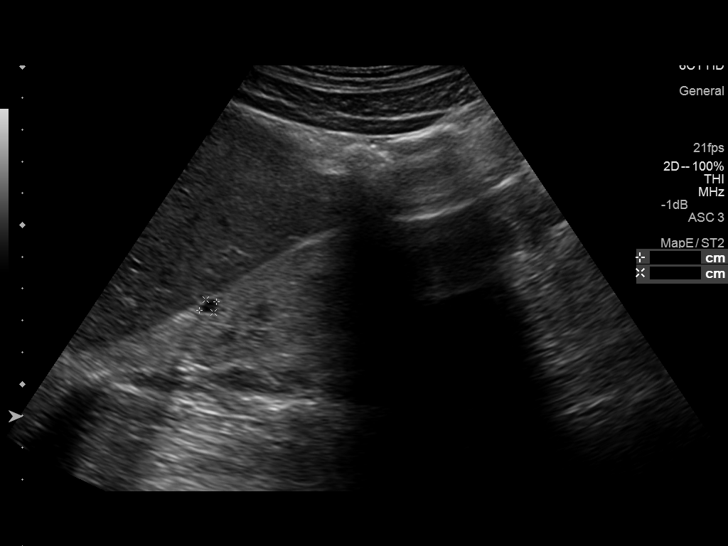
[im 17/38]
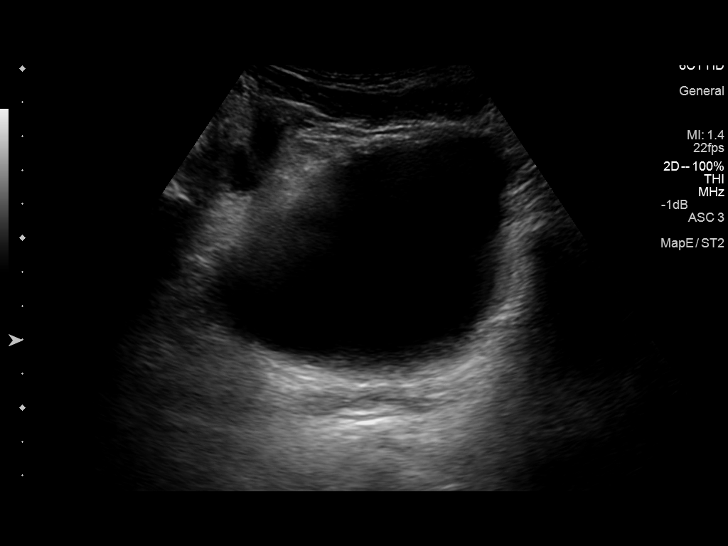
[im 21/38]
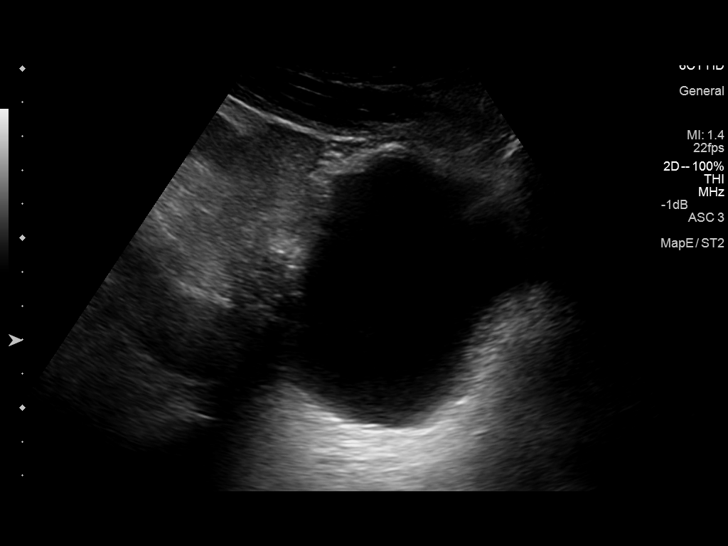
[im 24/38]
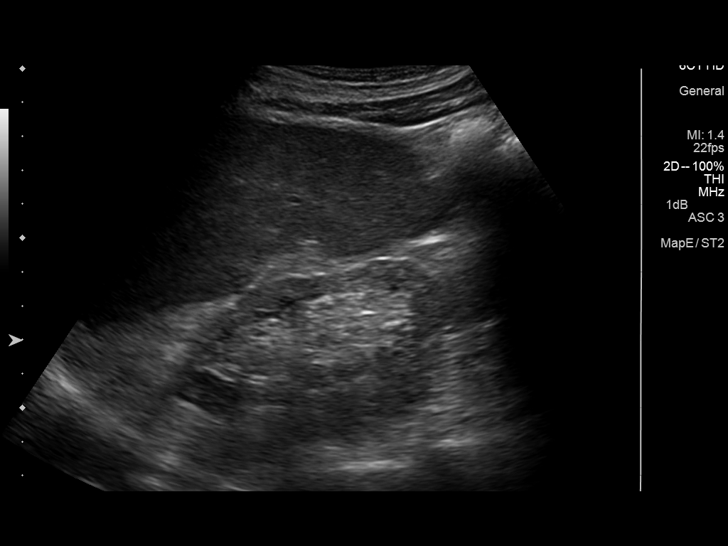
[im 25/38]
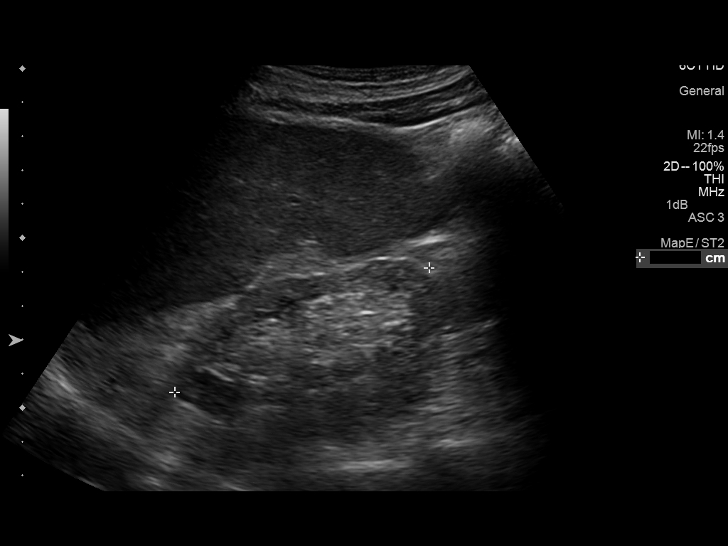
[im 28/38]
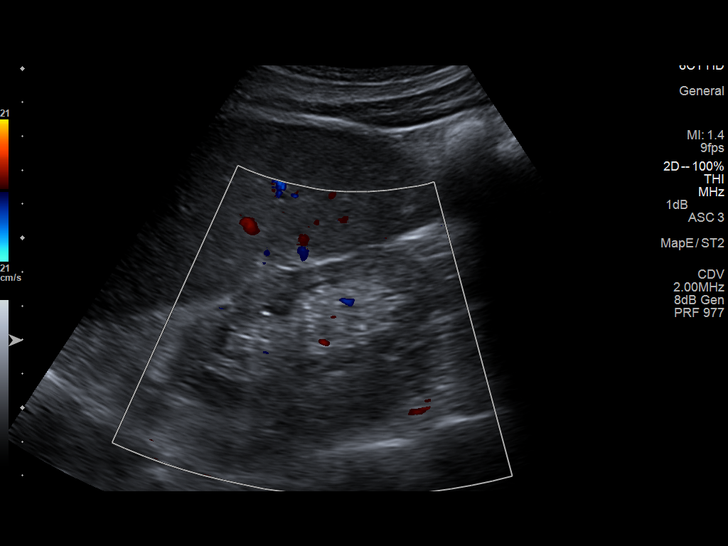
[im 31/38]
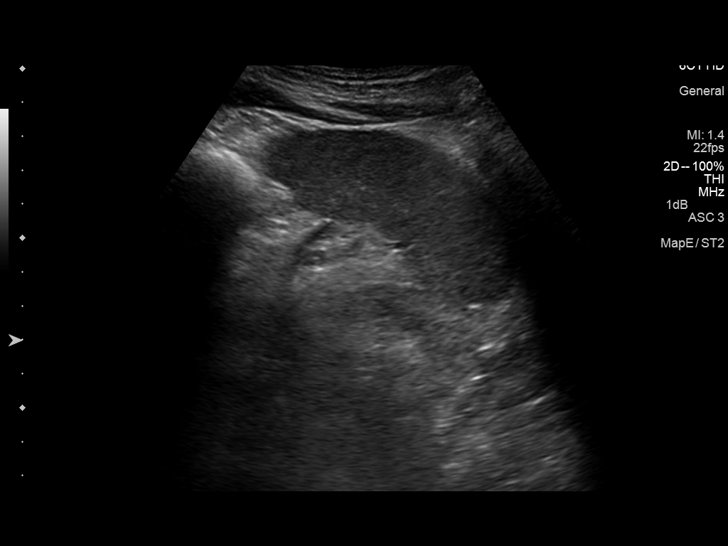
[im 34/38]
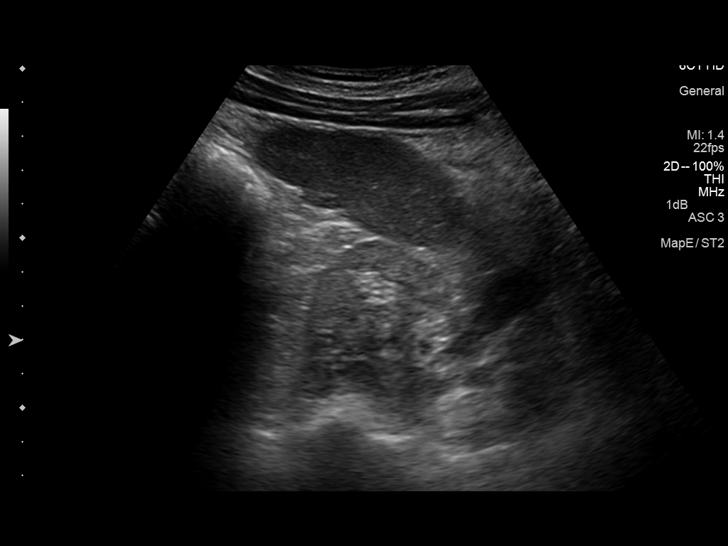
[im 38/38]
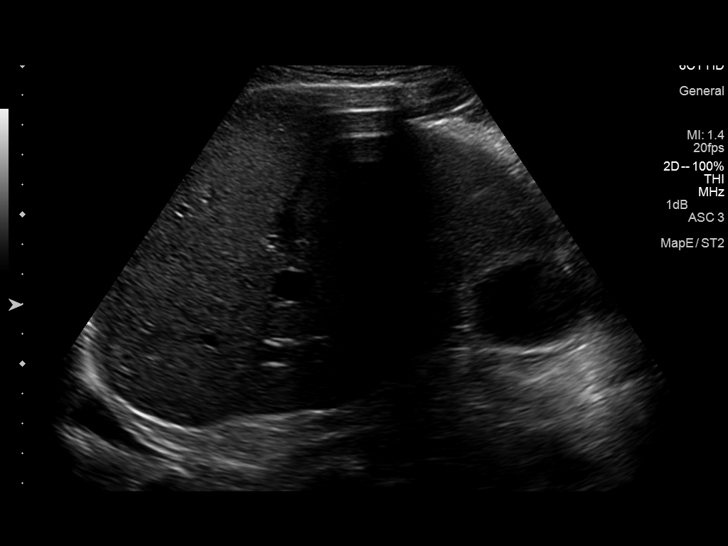

[14 of 25 positions shown; findings below may reference images not displayed]

FINDINGS: Right Kidney:

Length: 9.4 cm. Increased renal cortical echogenicity. No
hydronephrosis. 6 x 5 x 5 mm anechoic right renal mass consistent
with a small cyst.

Left Kidney:

Length: 8.5 cm. Increased renal cortical echogenicity. No mass or
hydronephrosis visualized.

Bladder:

Appears normal for degree of bladder distention.
IMPRESSION: 1. No obstructive uropathy.
2. Increased renal cortical echogenicity as can be seen with medical
renal disease.

## 2016-02-22 ENCOUNTER — Telehealth: Payer: Self-pay | Admitting: Family Medicine

## 2016-02-22 DIAGNOSIS — I639 Cerebral infarction, unspecified: Secondary | ICD-10-CM

## 2016-02-22 NOTE — Telephone Encounter (Signed)
Pt states murphy wainer will not do surgery on his shoulder until pt sees a neurologist, due to pt having a previous stroke. Pt would like a referral and when this is done, pt has an Environmental consultant, Court Joy  [phone] That he would like you to call and let her know about referral.

## 2016-02-22 NOTE — Telephone Encounter (Signed)
Placed referral. Called and spoke to [name] and informed her that the Neurology office would give her a call to schedule the appt.

## 2016-02-25 ENCOUNTER — Encounter: Payer: Self-pay | Admitting: Neurology

## 2016-02-25 ENCOUNTER — Ambulatory Visit (INDEPENDENT_AMBULATORY_CARE_PROVIDER_SITE_OTHER): Payer: BLUE CROSS/BLUE SHIELD | Admitting: Neurology

## 2016-02-25 ENCOUNTER — Telehealth: Payer: Self-pay | Admitting: Family Medicine

## 2016-02-25 VITALS — BP 200/110 | HR 100 | Ht 67.0 in | Wt 144.0 lb

## 2016-02-25 DIAGNOSIS — I6330 Cerebral infarction due to thrombosis of unspecified cerebral artery: Secondary | ICD-10-CM | POA: Diagnosis not present

## 2016-02-25 DIAGNOSIS — I10 Essential (primary) hypertension: Secondary | ICD-10-CM | POA: Diagnosis not present

## 2016-02-25 NOTE — Patient Instructions (Addendum)
1.  In regards to past history of stroke, you are cleared for surgery.  However, your blood pressure is very elevated and that may need to be looked at by Dr. [name] before you have surgery. 2.  Continue aspirin 81mg  daily 3.  Continue Lipitor 4.  Routine exercise 5.  Stop all energy/coffee drinks 6.  Mediterranean diet    Why follow it? Research shows. . Those who follow the Mediterranean diet have a reduced risk of heart disease  . The diet is associated with a reduced incidence of Parkinson's and Alzheimer's diseases . People following the diet may have longer life expectancies and lower rates of chronic diseases  . The Dietary Guidelines for Americans recommends the Mediterranean diet as an eating plan to promote health and prevent disease  What Is the Mediterranean Diet?  . Healthy eating plan based on typical foods and recipes of Mediterranean-style cooking . The diet is primarily a plant based diet; these foods should make up a majority of meals   Starches - Plant based foods should make up a majority of meals - They are an important sources of vitamins, minerals, energy, antioxidants, and fiber - Choose whole grains, foods high in fiber and minimally processed items  - Typical grain sources include wheat, oats, barley, corn, brown rice, bulgar, farro, millet, polenta, couscous  - Various types of beans include chickpeas, lentils, fava beans, black beans, white beans   Fruits  Veggies - Large quantities of antioxidant rich fruits & veggies; 6 or more servings  - Vegetables can be eaten raw or lightly drizzled with oil and cooked  - Vegetables common to the traditional Mediterranean Diet include: artichokes, arugula, beets, broccoli, brussel sprouts, cabbage, carrots, celery, collard greens, cucumbers, eggplant, kale, leeks, lemons, lettuce, mushrooms, okra, onions, peas, peppers, potatoes, pumpkin, radishes, rutabaga, shallots, spinach, sweet potatoes, turnips, zucchini - Fruits common  to the Mediterranean Diet include: apples, apricots, avocados, cherries, clementines, dates, figs, grapefruits, grapes, melons, nectarines, oranges, peaches, pears, pomegranates, strawberries, tangerines  Fats - Replace butter and margarine with healthy oils, such as olive oil, canola oil, and tahini  - Limit nuts to no more than a handful a day  - Nuts include walnuts, almonds, pecans, pistachios, pine nuts  - Limit or avoid candied, honey roasted or heavily salted nuts - Olives are central to the [employer] - can be eaten whole or used in a variety of dishes   Meats Protein - Limiting red meat: no more than a few times a month - When eating red meat: choose lean cuts and keep the portion to the size of deck of cards - Eggs: approx. 0 to 4 times a week  - Fish and lean poultry: at least 2 a week  - Healthy protein sources include, chicken, [place], lean beef, lamb - Increase intake of seafood such as tuna, salmon, trout, mackerel, shrimp, scallops - Avoid or limit high fat processed meats such as sausage and bacon  Dairy - Include moderate amounts of low fat dairy products  - Focus on healthy dairy such as fat free yogurt, skim milk, low or reduced fat cheese - Limit dairy products higher in fat such as whole or 2% milk, cheese, ice cream  Alcohol - Moderate amounts of red wine is ok  - No more than 5 oz daily for women (all ages) and men older than age 3  - No more than 10 oz of wine daily for men younger than 77  Other - Limit  sweets and other desserts  - Use herbs and spices instead of salt to flavor foods  - Herbs and spices common to the traditional Mediterranean Diet include: basil, bay leaves, chives, cloves, cumin, fennel, garlic, lavender, marjoram, mint, oregano, parsley, pepper, rosemary, sage, savory, sumac, tarragon, thyme   It's not just a diet, it's a lifestyle:  . The Mediterranean diet includes lifestyle factors typical of those in the region  . Foods, drinks and  meals are best eaten with others and savored . Daily physical activity is important for overall good health . This could be strenuous exercise like running and aerobics . This could also be more leisurely activities such as walking, housework, yard-work, or taking the stairs . Moderation is the key; a balanced and healthy diet accommodates most foods and drinks . Consider portion sizes and frequency of consumption of certain foods   Meal Ideas & Options:  . Breakfast:  o Whole wheat toast or whole wheat English muffins with peanut butter & hard boiled egg o Steel cut oats topped with apples & cinnamon and skim milk  o Fresh fruit: banana, strawberries, melon, berries, peaches  o Smoothies: strawberries, bananas, greek yogurt, peanut butter o Low fat greek yogurt with blueberries and granola  o Egg white omelet with spinach and mushrooms o Breakfast couscous: whole wheat couscous, apricots, skim milk, cranberries  . Sandwiches:  o Hummus and grilled vegetables (peppers, zucchini, squash) on whole wheat bread   o Grilled chicken on whole wheat pita with lettuce, tomatoes, cucumbers or tzatziki  o Tuna salad on whole wheat bread: tuna salad made with greek yogurt, olives, red peppers, capers, green onions o Garlic rosemary lamb pita: lamb sauted with garlic, rosemary, salt & pepper; add lettuce, cucumber, greek yogurt to pita - flavor with lemon juice and black pepper  . Seafood:  o Mediterranean grilled salmon, seasoned with garlic, basil, parsley, lemon juice and black pepper o Shrimp, lemon, and spinach whole-grain pasta salad made with low fat greek yogurt  o Seared scallops with lemon orzo  o Seared tuna steaks seasoned salt, pepper, coriander topped with tomato mixture of olives, tomatoes, olive oil, minced garlic, parsley, green onions and cappers  . Meats:  o Herbed greek chicken salad with kalamata olives, cucumber, feta  o Red bell peppers stuffed with spinach, bulgur, lean ground  beef (or lentils) & topped with feta   o Kebabs: skewers of chicken, tomatoes, onions, zucchini, squash  o [place] burgers: made with red onions, mint, dill, lemon juice, feta cheese topped with roasted red peppers . Vegetarian o Cucumber salad: cucumbers, artichoke hearts, celery, red onion, feta cheese, tossed in olive oil & lemon juice  o Hummus and whole grain pita points with a greek salad (lettuce, tomato, feta, olives, cucumbers, red onion) o Lentil soup with celery, carrots made with vegetable broth, garlic, salt and pepper  o Tabouli salad: parsley, bulgur, mint, scallions, cucumbers, tomato, radishes, lemon juice, olive oil, salt and pepper.

## 2016-02-25 NOTE — Progress Notes (Signed)
NEUROLOGY CONSULTATION NOTE  Thomas Robinson MRN: [id number] DOB: 1960/11/05  Referring provider: Dr. [name] Primary care provider: Dr. [name]  Reason for consult:  History of stroke.  Needs medical clearance for shoulder surgery.  HISTORY OF PRESENT ILLNESS: Thomas Robinson is a 55 year old right-handed man with hypertension and asthma who presents for previous stroke.  History obtained by patient and prior hospital notes.  Thomas Robinson is scheduled for shoulder surgery but needs clearance by neurology in regards to his previous stroke.  He was admitted to [facility] from 04/09/15 to 04/12/15 for left sided weakness and facial droop.  Blood pressure was 243/128.  He was noncompliant with his antihypertensive medications.  CT of brain was personally reviewed and showed hypoattenuation in the right thalamus and centrum semiovale, suggestive of acute stroke.  Follow up MRI of brain was personally reviewed and confirmed acute infarct in the right corona radiata.  MRA of head showed some intracranial atherosclerosis.  Carotid doppler revealed no hemodynamically significant ICA stenosis.  2D Echo showed EF of 55-60% and no source of embolus.  LDL was 92 and Hgb A1c was 5.1.  He had adjustments made to his antihypertensive regimen and was started on ASA 81mg  daily, Lipitor 10mg  daily.  Blood pressure today was 216/106.  He reports having drank two energy drinks and an espresso drink this morning.   PAST MEDICAL HISTORY: Past Medical History  Diagnosis Date  . Hypertension   . Asthma   . Stroke ([place])   . Chronic kidney disease   . Hyperlipidemia     PAST SURGICAL HISTORY: Past Surgical History  Procedure Laterality Date  . Rinoplasty      MEDICATIONS: Current Outpatient Prescriptions on File Prior to Visit  Medication Sig Dispense Refill  . acetaminophen (TYLENOL) 500 MG tablet Take 1,000 mg by mouth every 4 (four) hours as needed for mild pain or headache. Reported on 12/17/2015    .  albuterol (PROVENTIL HFA;VENTOLIN HFA) 108 (90 BASE) MCG/ACT inhaler Inhale 1-2 puffs into the lungs every 6 (six) hours as needed for wheezing or shortness of breath. 1 Inhaler 0  . amLODipine (NORVASC) 10 MG tablet Take 0.5 tablets (5 mg total) by mouth daily. 30 tablet 0  . aspirin 81 MG chewable tablet Chew 1 tablet (81 mg total) by mouth daily.    [name] atorvastatin (LIPITOR) 10 MG tablet Take 1 tablet (10 mg total) by mouth daily at 6 PM. 30 tablet 0  . calcitRIOL (ROCALTROL) 0.25 MCG capsule Take 0.25 mcg by mouth daily.    . DiphenhydrAMINE HCl (ZZZQUIL) 50 MG/30ML LIQD Take 30 mLs by mouth at bedtime as needed (for sleep).    . Fluticasone-Salmeterol (ADVAIR) 100-50 MCG/DOSE AEPB Inhale 1 puff into the lungs daily as needed (for shortness of breath). Reported on 12/17/2015    . furosemide (LASIX) 80 MG tablet Take 1 tablet (80 mg total) by mouth daily. 30 tablet 0  . hydrALAZINE (APRESOLINE) 25 MG tablet Take 2 tablets (50 mg total) by mouth 3 (three) times daily. 90 tablet 2  . metoprolol (LOPRESSOR) 50 MG tablet Take 1 tablet (50 mg total) by mouth 2 (two) times daily. 60 tablet 0   Current Facility-Administered Medications on File Prior to Visit  Medication Dose Route Frequency Provider Last Rate Last Dose  . methylPREDNISolone acetate (DEPO-MEDROL) injection 40 mg  40 mg Intra-articular Once [name] [name], MD        ALLERGIES: No Known Allergies  FAMILY HISTORY:  Family History  Problem Relation Age of Onset  . Cancer Mother     SOCIAL HISTORY: Social History   Social History  . Marital Status: Single    Spouse Name: N/A  . Number of Children: N/A  . Years of Education: N/A   Occupational History  . Not on file.   Social History Main Topics  . Smoking status: Never Smoker   . Smokeless tobacco: Never Used  . Alcohol Use: No     Comment: fORMER DRINKER  . Drug Use: No  . Sexual Activity: Not on file   Other Topics Concern  . Not on file   Social History  Narrative   Lives with a roommate in a one story home.  Has 1 child.  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  Education: high school.    REVIEW OF SYSTEMS: Constitutional: No fevers, chills, or sweats, no generalized fatigue, change in appetite Eyes: No visual changes, double vision, eye pain Ear, nose and throat: No hearing loss, ear pain, nasal congestion, sore throat Cardiovascular: No chest pain, palpitations Respiratory:  No shortness of breath at rest or with exertion, wheezes GastrointestinaI: No nausea, vomiting, diarrhea, abdominal pain, fecal incontinence Genitourinary:  No dysuria, urinary retention or frequency Musculoskeletal:  Left shoulder pain Integumentary: No rash, pruritus, skin lesions Neurological: as above Psychiatric: No depression, insomnia, anxiety Endocrine: No palpitations, fatigue, diaphoresis, mood swings, change in appetite, change in weight, increased thirst Hematologic/Lymphatic:  No purpura, petechiae. Allergic/Immunologic: no itchy/runny eyes, nasal congestion, recent allergic reactions, rashes  PHYSICAL EXAM: Filed Vitals:   02/25/16 1239  BP: 200/110  Pulse: 100   General: No acute distress.  Patient appears well-groomed. Head:  Normocephalic/atraumatic Eyes:  fundi examined but not visualized Neck: supple, no paraspinal tenderness, full range of motion Back: No paraspinal tenderness Heart: regular rate and rhythm Lungs: Clear to auscultation bilaterally. Vascular: No carotid bruits. Neurological Exam: Mental status: alert and oriented to person, place, and time, recent and remote memory intact, fund of knowledge intact, attention and concentration intact, speech fluent and not dysarthric, language intact. Cranial nerves: CN I: not tested CN II: pupils equal, round and reactive to light, visual fields intact CN III, IV, VI:  full range of motion, no nystagmus, no ptosis CN V: facial sensation intact CN VII: upper and lower face symmetric CN  VIII: hearing intact CN IX, X: gag intact, uvula midline CN XI: sternocleidomastoid and trapezius muscles intact CN XII: tongue midline Bulk & Tone: normal, no fasciculations. Motor:  Left deltoid 4/5 (possibly limited due to frozen shoulder).  Left triceps 5-/5, left hand grip 4+/5; otherwise 5/5 throughout Sensation: pinprick and vibration sensation intact. Deep Tendon Reflexes:  2+ throughout, toes downgoing.  Finger to nose testing:  Without dysmetria.  Heel to shin:  Without dysmetria.  Gait:  Left circumduction.  Able to turn and tandem walk. Romberg negative.  IMPRESSION: Right hemispheric infarct in the right corona radiata, secondary to small vessel disease Uncontrolled Hypertension  From a neurologic/stroke standpoint, he is cleared for surgery.  However, he has severely uncontrolled hypertension, which may preclude surgery.  I advised that he contact his PCP, Dr. [name], today.  I will send her a message in addition to this note.    PLAN: 1.  Optimize blood pressure control.  Must contact Dr. [name] today regarding blood pressure. 2.  Stop all energy drinks 3.  Continue ASA 81mg  daily 4.  Continue statin therapy as managed by Dr. [name].  LDL  goal should be less than 70. 5.  Mediterranean diet 6.  Routine exercise 7.  Follow up as needed.   Thank you for allowing me to take part in the care of this patient.  Metta Clines, DO  CC:  Betty [name], MD  Ranae Pila, MD

## 2016-02-25 NOTE — Telephone Encounter (Signed)
Called and spoke to pt. Pt is feeling fine, and would like to know if he can just increase his blood pressure medication to a whole pill instead of 1/2 instead of coming in for an appt.

## 2016-02-25 NOTE — Telephone Encounter (Signed)
Pt has called back and wanted to know if he should increase his medication or do Dr. [name] would like for him to come in tomorrow.

## 2016-02-25 NOTE — Telephone Encounter (Signed)
Pt would like to know if you could give him a call back.  Refuse [facility].   When he went to the Neurologist they told Him Bp was 213 and he need to contact his PCP.

## 2016-02-25 NOTE — Telephone Encounter (Signed)
Talked to Dr. [name] and she okayed pt increasing to 1 tablet per day. Notified patient and also scheduled him for his follow up appt in August.

## 2016-03-03 ENCOUNTER — Telehealth: Payer: Self-pay | Admitting: Family Medicine

## 2016-03-03 NOTE — Telephone Encounter (Signed)
He is not on Metoprolol, reported as discontinued last OV, so do not resume for now. Aspirin can be held until BP better controlled.  Calcitriol is a medication that nephrology prescribed, take it as instructed. Furosemide no changes, take it as instructed by prescriber. Thanks, BJ

## 2016-03-03 NOTE — Telephone Encounter (Signed)
I saw Thomas Robinson in 12/2015, his BP was elevated after he d/c antihypertensive meds. He agreed with resuming Amlodipje 5 mg, it was increased recently because elevated BP at the neurologists' office. Start Hydralazine 25 mg tid, continue Amlodipine, and f/u in 2-3 weeks, before if needed. Continue monitoring BP. Thanks, BJ

## 2016-03-03 NOTE — Telephone Encounter (Signed)
Informed pt's assistant about the amlodipine and hydralazine. Pt's assistant wants to know if he should continue the Aspirin, Furosemide, calcitriol, and metoprolol.

## 2016-03-03 NOTE — Telephone Encounter (Signed)
Please address patient concerns

## 2016-03-03 NOTE — Telephone Encounter (Signed)
Called and informed pt's assistant of the medications. She verbalized understanding and repeated it back to me. She is going to have him look at his calender and see what day will work best to come in for a 2-3 week follow up. They will continue to monitor his blood pressure as well.

## 2016-03-03 NOTE — Telephone Encounter (Signed)
Called and spoke with patient's assistant. She is concerned that he is not taking enough medication for his heart. I advised her that I would talk with Dr. [name] and see what medications the patient should be taking. Once I speak with Dr. [name], I will let the patient's assistant know.

## 2016-03-03 NOTE — Telephone Encounter (Signed)
Patient Name: Thomas Robinson  DOB: 09-03-1960    Initial Comment Caller states that her friend's blood pressure is 198/111 and has a HX of a stroke last year. pt is asymptomatic at this time. She is wondering about his medications. pt is supposed to have shoulder surgery in the near future.   Nurse Assessment  Nurse: Raphael Gibney, RN, Vanita Ingles Date/Time (Eastern Time): 03/03/2016 11:55:40 AM  Confirm and document reason for call. If symptomatic, describe symptoms. You must click the next button to save text entered. ---Caller states friend is at work. He had a CVA last year. He is taking his BP medication. BP 198/111 last reading. No symptoms. Has seen neurologist. He is taking amlodipine, ASA pravastin. He was taking 4-5 BP medication last year. He has ADHD. He is supposed to have shoulder surgery soon. No symptoms. When he saw Dr. [name], he told her that he is only supposed to take 3 medications. She would like call back from office to verify his medication list.  Has the patient traveled out of the country within the last 30 days? ---Not Applicable  Does the patient have any new or worsening symptoms? ---Yes  Will a triage be completed? ---No  Select reason for no triage. ---Other  Please document clinical information provided and list any resource used. ---Advised caller that someone from Dr. Doug Sou office will call her back to verify medication list. Verbalized understanding.     Guidelines    Guideline Title Affirmed Question Affirmed Notes       Final Disposition User   Clinical Call [place], RN, Vanita Ingles

## 2016-03-07 ENCOUNTER — Telehealth: Payer: Self-pay | Admitting: *Deleted

## 2016-03-07 ENCOUNTER — Other Ambulatory Visit: Payer: Self-pay | Admitting: *Deleted

## 2016-03-07 DIAGNOSIS — I10 Essential (primary) hypertension: Secondary | ICD-10-CM

## 2016-03-07 MED ORDER — AMLODIPINE BESYLATE 10 MG PO TABS: 5.0000 mg | ORAL_TABLET | Freq: Every day | ORAL | Status: DC

## 2016-03-07 MED ORDER — AMLODIPINE BESYLATE 10 MG PO TABS: 10.0000 mg | ORAL_TABLET | Freq: Every day | ORAL | Status: DC

## 2016-03-07 NOTE — Telephone Encounter (Signed)
Called pharmacy to let them know that medication was increased to 1 full tablet based off how high the pt's blood pressure had become. Sent in new Rx reflecting the 1 tablet a day. Pt has follow up scheduled for 8/23.

## 2016-03-07 NOTE — Telephone Encounter (Signed)
[facility] (ph#[phone]) faxed a note asking if the pt should take 1 tablet of the Amlodipine 10mg  as the Rx was sent in to take 1/2?

## 2016-03-07 NOTE — Telephone Encounter (Signed)
Amlodipine was resumed at lower dose at his last OV. I thought dose was increased to 10 mg because BP elevated and he did not want to follow. So I think he is taking Amlodipine 10 mg daily, f he is not he can certainly do so. He needs to schedule f/u appt.  Thanks, BJ

## 2016-03-23 ENCOUNTER — Other Ambulatory Visit: Payer: Self-pay | Admitting: Family Medicine

## 2016-03-23 DIAGNOSIS — I10 Essential (primary) hypertension: Secondary | ICD-10-CM

## 2016-04-07 ENCOUNTER — Ambulatory Visit (INDEPENDENT_AMBULATORY_CARE_PROVIDER_SITE_OTHER): Payer: BLUE CROSS/BLUE SHIELD | Admitting: Family Medicine

## 2016-04-07 ENCOUNTER — Encounter: Payer: Self-pay | Admitting: Family Medicine

## 2016-04-07 VITALS — BP 180/90 | HR 96 | Resp 12 | Ht 67.0 in | Wt 146.1 lb

## 2016-04-07 DIAGNOSIS — R002 Palpitations: Secondary | ICD-10-CM

## 2016-04-07 DIAGNOSIS — I10 Essential (primary) hypertension: Secondary | ICD-10-CM

## 2016-04-07 DIAGNOSIS — S0990XA Unspecified injury of head, initial encounter: Secondary | ICD-10-CM

## 2016-04-07 MED ORDER — METOPROLOL TARTRATE 50 MG PO TABS: 75.0000 mg | ORAL_TABLET | Freq: Two times a day (BID) | ORAL | 2 refills | Status: DC

## 2016-04-07 NOTE — Progress Notes (Signed)
Pre visit review using our clinic review tool, if applicable. No additional management support is needed unless otherwise documented below in the visit note. 

## 2016-04-07 NOTE — Progress Notes (Signed)
Mr. Thomas Robinson is a 55 y.o.male, who is here today to follow on HTN. He is planning on having shoulder surgery and is afraid surgery may be postpone due to elevated BP.  I saw [name] 01/14/16, at which time he had discontinued his antihypertensive mediations. He had neuro evaluation on 02/25/16 to clear him for surgery given his Hx of seizures and his elevated BP was a concerned. He refused to come to office to follow ,so medications were resumed and have been adjusted.  Currently he is on Hydralazine 25 mg bid, Metoprolol Tartrate 50 mg bid, and Amlodipine 10 mg daily. He is taking medications as instructed, no side effects reported.  He has not noted visual changes, exertional chest pain, dyspnea,  focal weakness, or edema.  Hx of CKD IV, he follows with nephrologists, according to pt, he last followed about 2 months ago. Hx of CVA.  Noted mid frontal ecchymosis and bloody crust, he states that he had another fall on  04/05/16, states that he tripped , denies LOC. + Frontal and occipital "some" pressure like sensation, which he states he was having before fall. Left knee also injured, no deformity, mild pain with palpation on medial aspect; improving.  No associated nausea,vomiting, or focal weakness. He denies high alcohol intake, states that he drank some day of fall. He denies illicit drug use.  + Palpitations, which he has had for a while but becoming more frequent, mainly at night when he is in bed. He usually gets up and gets some air, lasts a few minutes. Denies dizziness or diaphoresis. No orthopnea or PND.  He takes Furosemide 80 mg daily. Hx of asthma, has needed Albuterol inh a couple time in the past few weeks, no cough or fever.   Lab Results  Component Value Date   CREATININE 4.96 (H) 04/12/2015   BUN 54 (H) 04/12/2015   NA 141 04/12/2015   K 3.7 04/12/2015   CL 105 04/12/2015   CO2 24 04/12/2015    Lab Results  Component Value Date   WBC 3.7 (L)  04/10/2015   HGB 9.8 (L) 04/10/2015   HCT 29.7 (L) 04/10/2015   MCV 90.3 04/10/2015   PLT 168 04/10/2015    Echo 04/2015:The cavity size was normal. There was moderate concentric hypertrophy. Systolic function was normal. The   estimated ejection fraction was in the range of 55% to 60%. Wall motion was normal; there were no regional wall motion   abnormalities. Features are consistent with a pseudonormal left ventricular filling pattern, with concomitant abnormal relaxation   and increased filling pressure (grade 2 diastolic dysfunction).Doppler parameters are consistent with high ventricular filling   pressure.   Review of Systems  Constitutional: Positive for fatigue (no more than usual). Negative for appetite change, fever and unexpected weight change.  HENT: Negative for facial swelling, nosebleeds, sore throat and trouble swallowing.   Eyes: Negative for redness and visual disturbance.  Respiratory: Positive for wheezing. Negative for cough, shortness of breath and stridor.   Cardiovascular: Positive for palpitations. Negative for chest pain and leg swelling.  Gastrointestinal: Negative for abdominal pain, nausea and vomiting.       NO changes in bowel habits.  Genitourinary: Negative for decreased urine volume, frequency and hematuria.  Musculoskeletal: Positive for arthralgias. Negative for neck pain and neck stiffness.  Skin: Negative for color change and rash.  Neurological: Positive for headaches. Negative for dizziness, seizures, syncope and weakness.  Psychiatric/Behavioral: Negative for confusion. The  patient is not nervous/anxious.      Current Outpatient Prescriptions on File Prior to Visit  Medication Sig Dispense Refill  . acetaminophen (TYLENOL) 500 MG tablet Take 1,000 mg by mouth every 4 (four) hours as needed for mild pain or headache. Reported on 12/17/2015    . albuterol (PROVENTIL HFA;VENTOLIN HFA) 108 (90 BASE) MCG/ACT inhaler Inhale 1-2 puffs into the lungs  every 6 (six) hours as needed for wheezing or shortness of breath. 1 Inhaler 0  . amLODipine (NORVASC) 10 MG tablet Take 1 tablet (10 mg total) by mouth daily. 30 tablet 1  . aspirin 81 MG chewable tablet Chew 1 tablet (81 mg total) by mouth daily.    [name] atorvastatin (LIPITOR) 10 MG tablet Take 1 tablet (10 mg total) by mouth daily at 6 PM. 30 tablet 0  . calcitRIOL (ROCALTROL) 0.25 MCG capsule Take 0.25 mcg by mouth daily.    . DiphenhydrAMINE HCl (ZZZQUIL) 50 MG/30ML LIQD Take 30 mLs by mouth at bedtime as needed (for sleep).    . Fluticasone-Salmeterol (ADVAIR) 100-50 MCG/DOSE AEPB Inhale 1 puff into the lungs daily as needed (for shortness of breath). Reported on 12/17/2015    . furosemide (LASIX) 80 MG tablet Take 1 tablet (80 mg total) by mouth daily. 30 tablet 0  . hydrALAZINE (APRESOLINE) 25 MG tablet Take 2 tablets (50 mg total) by mouth 3 (three) times daily. 90 tablet 2   Current Facility-Administered Medications on File Prior to Visit  Medication Dose Route Frequency Provider Last Rate Last Dose  . methylPREDNISolone acetate (DEPO-MEDROL) injection 40 mg  40 mg Intra-articular Once Raford Brissett G [name], MD         Past Medical History:  Diagnosis Date  . Asthma   . Chronic kidney disease   . Hyperlipidemia   . Hypertension   . Stroke [facility])     No Known Allergies  Social History   Social History  . Marital status: Single    Spouse name: N/A  . Number of children: N/A  . Years of education: N/A   Social History Main Topics  . Smoking status: Never Smoker  . Smokeless tobacco: Never Used  . Alcohol use No     Comment: fORMER DRINKER  . Drug use: No  . Sexual activity: Not Asked   Other Topics Concern  . None   Social History Narrative   Lives with a roommate in a one story home.  Has 1 child.  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  Education: high school.    Vitals:   04/07/16 1422  BP: (!) 180/90  Pulse: 96  Resp: 12   Body mass index is 22.89 kg/m.  O2 sat  97% at RA.  Physical Exam  Constitutional: He is oriented to person, place, and time. He appears well-developed. He does not appear ill. No distress.  HENT:  Head: Atraumatic.    Mouth/Throat: Oropharynx is clear and moist and mucous membranes are normal.  Eyes: Conjunctivae and EOM are normal. Pupils are equal, round, and reactive to light.  Neck: No JVD present.  Cardiovascular: Normal rate and regular rhythm.   Occasional extrasystoles (x 1) are present.  Murmur (SEM I/VI RUSB) heard. Respiratory: Effort normal and breath sounds normal. No respiratory distress.  GI: Soft. He exhibits no mass. There is no tenderness.  Musculoskeletal: He exhibits edema (1+ LE pitting edema).       Cervical back: He exhibits no tenderness and no edema.  Left shoulder  decreased ROM and pain elicited with movement. Left knee no deformity, mild effusion, no erythema.  Lymphadenopathy:    He has no cervical adenopathy.  Neurological: He is alert and oriented to person, place, and time. No cranial nerve deficit.  LUE muscle atrophy and strength 4/5, rest 5/5. Slow, otherwise stable gait, no assisted.  Skin: Skin is warm. Abrasion noted. No erythema.  ecchymosis and bloody crust on mid upper forehead. No bone abnormality  Psychiatric: His speech is normal. His mood appears anxious.  Poor groomed, good eye contact.    ASSESSMENT AND PLAN:   Remo Lipps was seen today for blood pressure problem.  Diagnoses and all orders for this visit:  Hypertension, essential  Still elevated. Metoprolol increased from 50 mg to 75 mg bid. Rest unchanged. Continue monitoring BP at home. F/U in 2 weeks.   -     EKG 12-Lead -     Basic Metabolic Panel -     TSH -     CBC -     Ambulatory referral to Cardiology -     metoprolol (LOPRESSOR) 50 MG tablet; Take 1.5 tablets (75 mg total) by mouth 2 (two) times daily.  Palpitations  EKG today abnormal: SR, criteria for LVH with straining pattern, poor R wave  progression. T wave abnormalities on lateral leads, this is present in prior EKG's but DI T wave inversion is new (flat on other EKG's), rest unchanged. He is not symptomatic at this time but given the fact he is planning on left shoulder surgery I think cardiac evaluation is warrant at this time. Clearly instructed about warning signs.  -     EKG 12-Lead -     TSH -     CBC -     Ambulatory referral to Cardiology -     metoprolol (LOPRESSOR) 50 MG tablet; Take 1.5 tablets (75 mg total) by mouth 2 (two) times daily.  Head trauma, initial encounter  Fall precautions. He denies alcohol abuse but problem listed on PMHx, recommend avoiding alcohol intake. I do not think head imaging is needed at the time but he was clearly instructed about warning signs.     -Mr. [name] advised to return sooner than planned today if new concerns arise, he voices understanding.     Zeriah Baysinger G. [name], MD  [facility] [place] office.

## 2016-04-07 NOTE — Patient Instructions (Addendum)
A few things to remember from today's visit:   Hypertension, essential - Plan: EKG [id number], Basic Metabolic Panel, TSH, CBC, Ambulatory referral to Cardiology, metoprolol (LOPRESSOR) 50 MG tablet  Palpitations - Plan: EKG 12-Lead, TSH, CBC, Ambulatory referral to Cardiology, metoprolol (LOPRESSOR) 50 MG tablet  Cardiology referral was placed. I would like for you to follow in 2 weeks, if you see cardiology before, you can change  appointment for 4-6 weeks for follow-up. Continue monitoring blood pressure and heart rate. Please seek immediate medical attention if any warning sign as chest pain or difficulty breathing, feeling clammy, or worsening palpitation.  Please be sure medication list is accurate. If a new problem present, please set up appointment sooner than planned today.

## 2016-04-08 LAB — BASIC METABOLIC PANEL
BUN: 82 mg/dL — ABNORMAL HIGH (ref 6–23)
CALCIUM: 9 mg/dL (ref 8.4–10.5)
CO2: 22 mEq/L (ref 19–32)
Chloride: 100 mEq/L (ref 96–112)
Creatinine, Ser: 6.59 mg/dL (ref 0.40–1.50)
GFR: 9.29 mL/min — CL (ref >60.00)
GLUCOSE: 117 mg/dL — AB (ref 70–99)
POTASSIUM: 3.3 meq/L — AB (ref 3.5–5.1)
SODIUM: 140 meq/L (ref 135–145)

## 2016-04-08 LAB — TSH: TSH: 3.28 u[IU]/mL (ref 0.35–4.50)

## 2016-04-08 LAB — CBC
HCT: 32.6 % — ABNORMAL LOW (ref 39.0–52.0)
Hemoglobin: 11.2 g/dL — ABNORMAL LOW (ref 13.0–17.0)
MCHC: 34.4 g/dL (ref 30.0–36.0)
MCV: 87 fl (ref 78.0–100.0)
PLATELETS: 183 10*3/uL (ref 150.0–400.0)
RBC: 3.74 Mil/uL — AB (ref 4.22–5.81)
RDW: 16.2 % — ABNORMAL HIGH (ref 11.5–15.5)
WBC: 5.1 10*3/uL (ref 4.0–10.5)

## 2016-04-14 NOTE — Progress Notes (Deleted)
Cardiology Office Note   Date:  04/14/2016   ID:  Thomas Robinson, Thomas Robinson [birth year], MRN [id number]  PCP:  Betty [name], MD  Cardiologist:   Jenkins Rouge, MD   No chief complaint on file.     History of Present Illness: Thomas Robinson is a 55 y.o. male who presents for preop clarence. May need shoulder surgery. Unfortunately has had poorly controlled renovascular hypertension with grade 4-5 kidney failure. Chronically abnormal ECG LVH and strain. Having some palpitations at night  Lab Results  Component Value Date   CREATININE 6.59 (HH) 04/07/2016   BUN 82 (H) 04/07/2016   NA 140 04/07/2016   K 3.3 (L) 04/07/2016   CL 100 04/07/2016   CO2 22 04/07/2016    + Palpitations, which he has had for a while but becoming more frequent, mainly at night when he is in bed. He usually gets up and gets some air, lasts a few minutes. Denies dizziness or diaphoresis. No orthopnea or PND.  Echo 04/10/15 reviewed and EF 55-60% moderate LVH grade 2 diastolic dysfunction AV sclerosis and moderate AR   Also history of CVA/TIA:   August 2016 with no carotid DX. Presented with left sided Weakness and facial droop. With small acute lacunar infarct small vessel Dx  Past Medical History:  Diagnosis Date  . Asthma   . Chronic kidney disease   . Hyperlipidemia   . Hypertension   . Stroke [facility])     Past Surgical History:  Procedure Laterality Date  . RINOPLASTY       Current Outpatient Prescriptions  Medication Sig Dispense Refill  . acetaminophen (TYLENOL) 500 MG tablet Take 1,000 mg by mouth every 4 (four) hours as needed for mild pain or headache. Reported on 12/17/2015    . albuterol (PROVENTIL HFA;VENTOLIN HFA) 108 (90 BASE) MCG/ACT inhaler Inhale 1-2 puffs into the lungs every 6 (six) hours as needed for wheezing or shortness of breath. 1 Inhaler 0  . amLODipine (NORVASC) 10 MG tablet Take 1 tablet (10 mg total) by mouth daily. 30 tablet 1  . aspirin 81 MG chewable tablet Chew 1  tablet (81 mg total) by mouth daily.    [name] atorvastatin (LIPITOR) 10 MG tablet Take 1 tablet (10 mg total) by mouth daily at 6 PM. 30 tablet 0  . calcitRIOL (ROCALTROL) 0.25 MCG capsule Take 0.25 mcg by mouth daily.    . DiphenhydrAMINE HCl (ZZZQUIL) 50 MG/30ML LIQD Take 30 mLs by mouth at bedtime as needed (for sleep).    . Fluticasone-Salmeterol (ADVAIR) 100-50 MCG/DOSE AEPB Inhale 1 puff into the lungs daily as needed (for shortness of breath). Reported on 12/17/2015    . furosemide (LASIX) 80 MG tablet Take 1 tablet (80 mg total) by mouth daily. 30 tablet 0  . hydrALAZINE (APRESOLINE) 25 MG tablet Take 2 tablets (50 mg total) by mouth 3 (three) times daily. 90 tablet 2  . metoprolol (LOPRESSOR) 50 MG tablet Take 1.5 tablets (75 mg total) by mouth 2 (two) times daily. 45 tablet 2   No current facility-administered medications for this visit.    Facility-Administered Medications Ordered in Other Visits  Medication Dose Route Frequency Provider Last Rate Last Dose  . methylPREDNISolone acetate (DEPO-MEDROL) injection 40 mg  40 mg Intra-articular Once [name] [name], MD        Allergies:   Review of patient's allergies indicates no known allergies.    Social History:  The patient  reports that he has never smoked.  He has never used smokeless tobacco. He reports that he does not drink alcohol or use drugs.   Family History:  The patient's family history includes Cancer in his mother.    ROS:  Please see the history of present illness.   Otherwise, review of systems are positive for {NONE DEFAULTED:18576::"none"}.   All other systems are reviewed and negative.    PHYSICAL EXAM: VS:  There were no vitals taken for this visit. , BMI There is no height or weight on file to calculate BMI. Affect appropriate Healthy:  appears stated age 57: normal Neck supple with no adenopathy JVP normal no bruits no thyromegaly Lungs clear with no wheezing and good diaphragmatic motion Heart:  S1/S2 no  murmur, no rub, gallop or click PMI normal Abdomen: benighn, BS positve, no tenderness, no AAA no bruit.  No HSM or HJR Distal pulses intact with no bruits No edema Neuro non-focal Skin warm and dry No muscular weakness    EKG:   04/08/15 SR rate 83 low atrial foci LVH with strain    Recent Labs: 04/07/2016: BUN 82; Creatinine, Ser 6.59; Hemoglobin 11.2; Platelets 183.0; Potassium 3.3; Sodium 140; TSH 3.28    Lipid Panel    Component Value Date/Time   CHOL 162 04/11/2015 0951   TRIG 125 04/11/2015 0951   HDL 45 04/11/2015 0951   CHOLHDL 3.6 04/11/2015 0951   VLDL 25 04/11/2015 0951   LDLCALC 92 04/11/2015 0951      Wt Readings from Last 3 Encounters:  04/07/16 146 lb 2 oz (66.3 kg)  02/25/16 144 lb (65.3 kg)  01/14/16 140 lb (63.5 kg)      Other studies Reviewed: Additional studies/ records that were reviewed today include I Epic primary notes, ECG, MRI/MRA head Echo and carotid duplex     ASSESSMENT AND PLAN:  1. Preop  2. HTN: 3. CRF 4. TIA 5. Murmur 6. Abnormal ECG     Current medicines are reviewed at length with the patient today.  The patient {ACTIONS; HAS/DOES NOT HAVE:19233} concerns regarding medicines.  The following changes have been made:  {PLAN; NO CHANGE:13088:s}  Labs/ tests ordered today include: *** No orders of the defined types were placed in this encounter.    Disposition:   FU with ***     Signed, Jenkins Rouge, MD  04/14/2016 9:34 PM    [facility] [address], [place], [place]  [address] Phone: [phone]; Fax: [phone]

## 2016-04-15 ENCOUNTER — Ambulatory Visit: Payer: BLUE CROSS/BLUE SHIELD | Admitting: Cardiovascular Disease

## 2016-04-23 ENCOUNTER — Ambulatory Visit: Payer: BLUE CROSS/BLUE SHIELD | Admitting: Family Medicine

## 2016-04-23 ENCOUNTER — Encounter: Payer: Self-pay | Admitting: Cardiovascular Disease

## 2016-05-27 ENCOUNTER — Telehealth: Payer: Self-pay | Admitting: Family Medicine

## 2016-05-27 DIAGNOSIS — I10 Essential (primary) hypertension: Secondary | ICD-10-CM

## 2016-05-27 DIAGNOSIS — R002 Palpitations: Secondary | ICD-10-CM

## 2016-05-27 MED ORDER — METOPROLOL TARTRATE 50 MG PO TABS: 75.0000 mg | ORAL_TABLET | Freq: Two times a day (BID) | ORAL | 2 refills | Status: DC

## 2016-05-27 NOTE — Telephone Encounter (Signed)
Please advise on how much of the lopressor he is suppose to be taking.

## 2016-05-27 NOTE — Telephone Encounter (Signed)
According to my last note Metoprolol Tartrate was increased from 50 mg to 75 mg bid (1.5 tab bid). He was supposed to have 2 weeks a follow up and was supposed to see cardiologists for surgery clearance (I do not see visit). Please ask him to schedule a f/u appt, 6-8 weeks.  Thanks, BJ

## 2016-05-27 NOTE — Telephone Encounter (Signed)
Rx sent in with note that appt is needed in 6 weeks.

## 2016-05-27 NOTE — Telephone Encounter (Signed)
Sherry with [facility] called to get pt's rx refilled.  metoprolol (LOPRESSOR) 50 MG tablet  Pt was only prescribed 45 tabs, at 1.5 /day that is only 15 days. Pt has run out of this medicine.  Requesting to changed the amount if pt is supposed to take daily.  Pt requesting called in to [facility] pharmacy They will deliver if it can get there before 1:00 pm.  [facility]  [address] [place], [place], [place] [address]    Phone: [phone]

## 2016-09-01 DIAGNOSIS — I639 Cerebral infarction, unspecified: Secondary | ICD-10-CM | POA: Insufficient documentation

## 2016-09-15 ENCOUNTER — Other Ambulatory Visit: Payer: Self-pay

## 2016-09-15 DIAGNOSIS — I10 Essential (primary) hypertension: Secondary | ICD-10-CM

## 2016-09-15 DIAGNOSIS — R002 Palpitations: Secondary | ICD-10-CM

## 2016-09-15 MED ORDER — METOPROLOL TARTRATE 50 MG PO TABS: 75.0000 mg | ORAL_TABLET | Freq: Two times a day (BID) | ORAL | 1 refills | Status: DC

## 2016-09-19 IMAGING — DX DG SHOULDER 2+V*L*
3 series · 3 of 3 positions shown · non-contrast
Comparison: 04/09/2015.

CLINICAL DATA: Fall.  Limited range of motion.

EXAM:
LEFT SHOULDER - 2+ VIEW

[grashey]
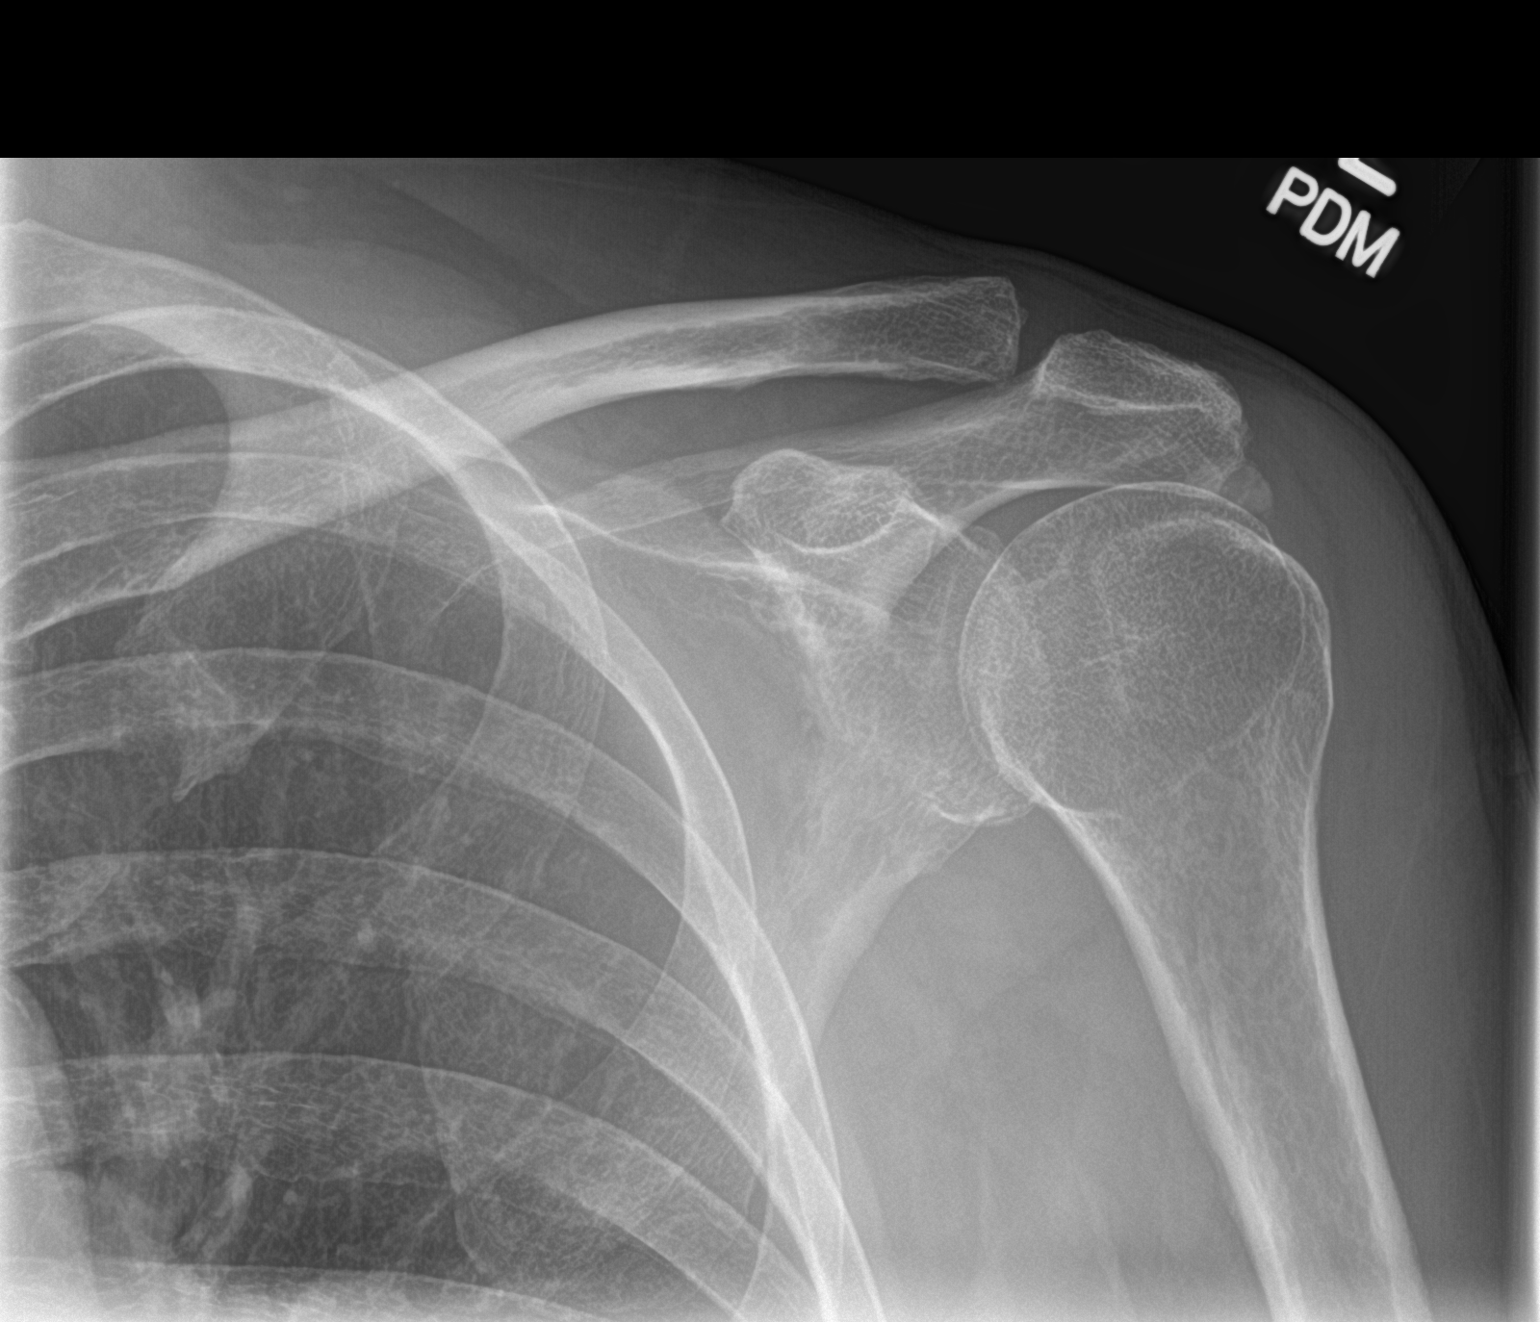

[y view]
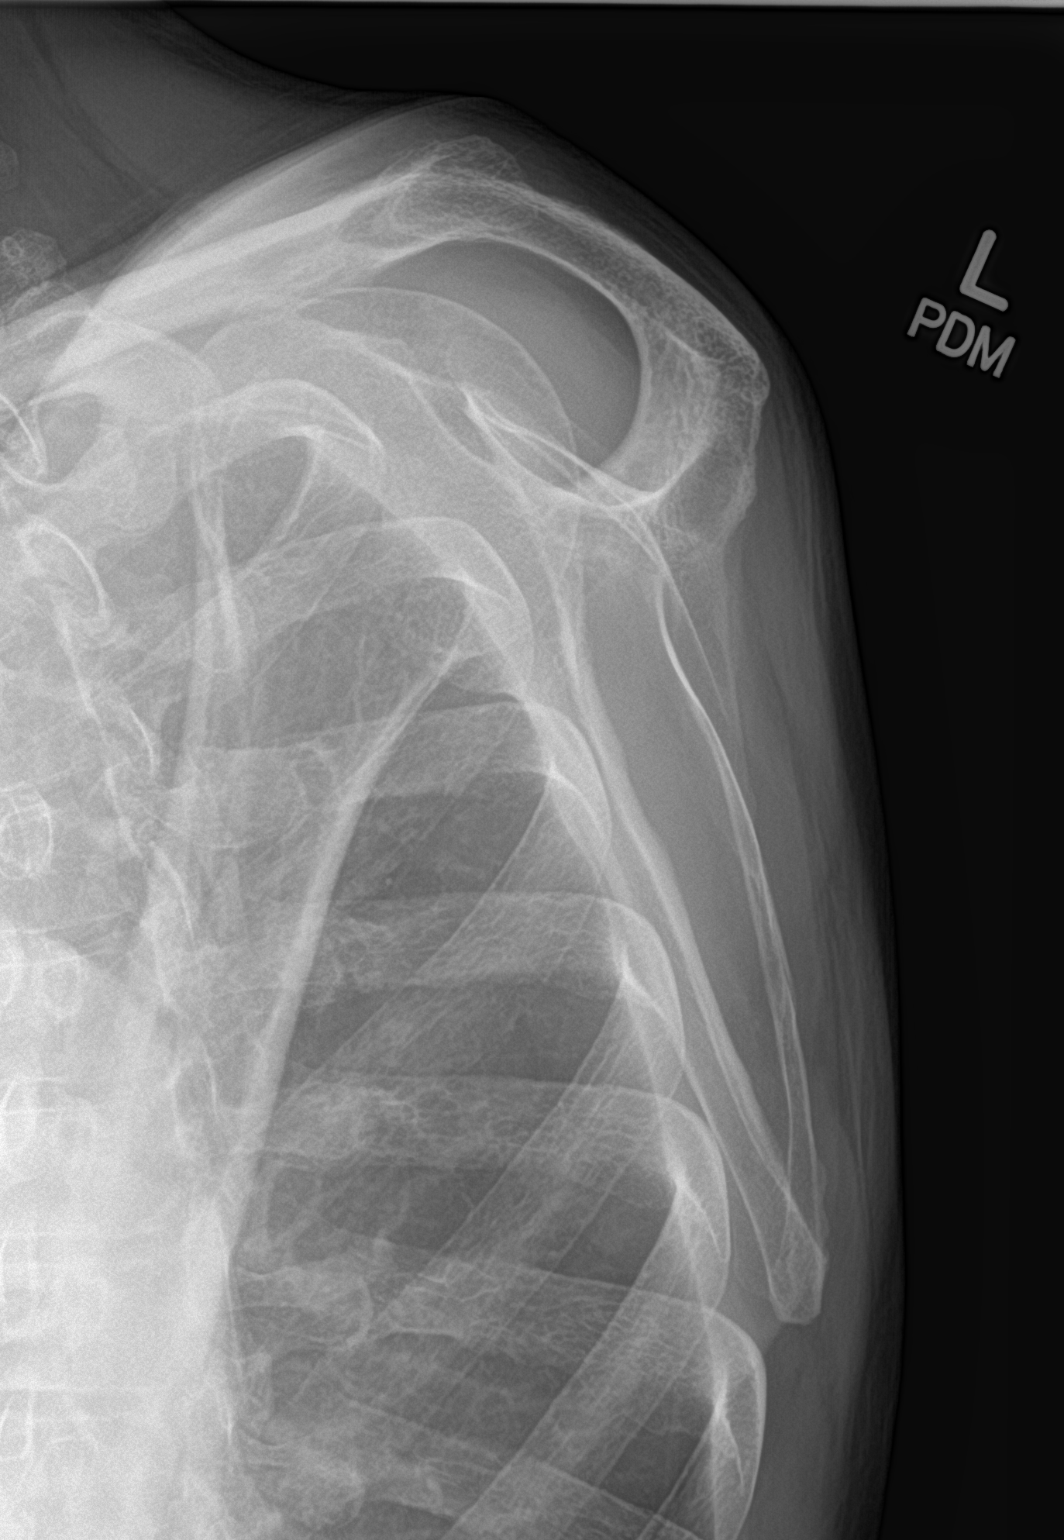

[shoulder axial]
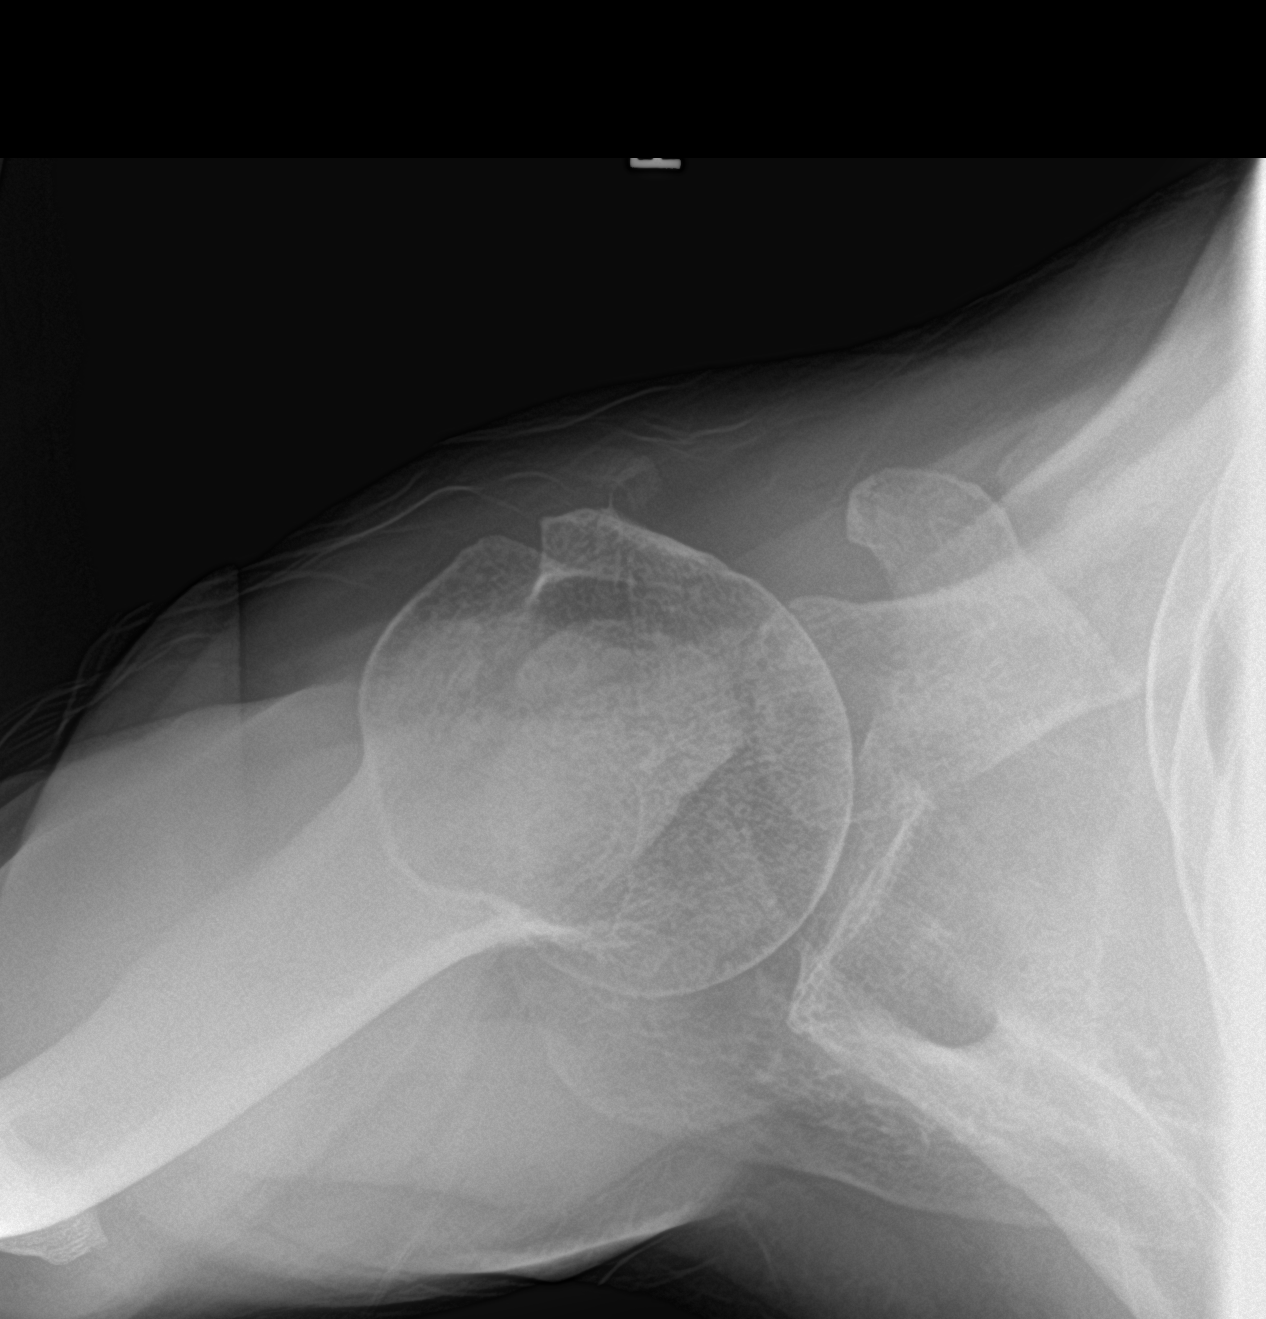

[3 of 3 positions shown; findings below may reference images not displayed]

FINDINGS: No acute bony or joint abnormality identified. No evidence of
fracture or dislocation. Calcification in the supraspinatus tendon
consistent calcific supraspinatus tendinosis. Mild degenerative
change noted about the acromioclavicular and glenohumeral joints.
IMPRESSION: 1. Calcific supraspinatus tendinosis. Mild degenerative changes
about the acromioclavicular and glenohumeral joints.

2. No acute bony abnormality identified.

## 2016-11-04 ENCOUNTER — Other Ambulatory Visit (HOSPITAL_COMMUNITY): Payer: Self-pay | Admitting: *Deleted

## 2016-11-05 ENCOUNTER — Ambulatory Visit (HOSPITAL_COMMUNITY): Admission: RE | Admit: 2016-11-05 | Payer: BLUE CROSS/BLUE SHIELD | Source: Ambulatory Visit

## 2016-12-05 ENCOUNTER — Other Ambulatory Visit: Payer: Self-pay | Admitting: Family Medicine

## 2016-12-05 ENCOUNTER — Ambulatory Visit
Admission: RE | Admit: 2016-12-05 | Discharge: 2016-12-05 | Disposition: A | Discharge status: Home / Self Care | Payer: BLUE CROSS/BLUE SHIELD | Source: Ambulatory Visit | Attending: Family Medicine | Admitting: Family Medicine

## 2016-12-05 DIAGNOSIS — R0600 Dyspnea, unspecified: Secondary | ICD-10-CM

## 2016-12-12 ENCOUNTER — Other Ambulatory Visit: Payer: Self-pay | Admitting: Family Medicine

## 2016-12-12 ENCOUNTER — Ambulatory Visit
Admission: RE | Admit: 2016-12-12 | Discharge: 2016-12-12 | Disposition: A | Discharge status: Home / Self Care | Payer: BLUE CROSS/BLUE SHIELD | Source: Ambulatory Visit | Attending: Family Medicine | Admitting: Family Medicine

## 2016-12-12 DIAGNOSIS — M25531 Pain in right wrist: Secondary | ICD-10-CM

## 2017-05-24 ENCOUNTER — Inpatient Hospital Stay (HOSPITAL_COMMUNITY)
Admission: EM | Admit: 2017-05-24 | Discharge: 2017-06-02 | DRG: 674 | Disposition: A | Discharge status: Home / Self Care | Payer: Medicaid Other | Attending: Nephrology | Admitting: Nephrology

## 2017-05-24 ENCOUNTER — Encounter (HOSPITAL_COMMUNITY): Payer: Self-pay | Admitting: Emergency Medicine

## 2017-05-24 DIAGNOSIS — Z79899 Other long term (current) drug therapy: Secondary | ICD-10-CM | POA: Diagnosis not present

## 2017-05-24 DIAGNOSIS — R64 Cachexia: Secondary | ICD-10-CM | POA: Diagnosis present

## 2017-05-24 DIAGNOSIS — E875 Hyperkalemia: Secondary | ICD-10-CM | POA: Diagnosis present

## 2017-05-24 DIAGNOSIS — D539 Nutritional anemia, unspecified: Secondary | ICD-10-CM | POA: Diagnosis present

## 2017-05-24 DIAGNOSIS — N19 Unspecified kidney failure: Secondary | ICD-10-CM | POA: Diagnosis not present

## 2017-05-24 DIAGNOSIS — Z992 Dependence on renal dialysis: Secondary | ICD-10-CM | POA: Diagnosis not present

## 2017-05-24 DIAGNOSIS — J45909 Unspecified asthma, uncomplicated: Secondary | ICD-10-CM | POA: Diagnosis present

## 2017-05-24 DIAGNOSIS — Z681 Body mass index (BMI) 19 or less, adult: Secondary | ICD-10-CM | POA: Diagnosis not present

## 2017-05-24 DIAGNOSIS — E872 Acidosis, unspecified: Secondary | ICD-10-CM | POA: Diagnosis present

## 2017-05-24 DIAGNOSIS — I10 Essential (primary) hypertension: Secondary | ICD-10-CM | POA: Diagnosis present

## 2017-05-24 DIAGNOSIS — N185 Chronic kidney disease, stage 5: Secondary | ICD-10-CM | POA: Diagnosis present

## 2017-05-24 DIAGNOSIS — Z598 Other problems related to housing and economic circumstances: Secondary | ICD-10-CM | POA: Diagnosis not present

## 2017-05-24 DIAGNOSIS — E46 Unspecified protein-calorie malnutrition: Secondary | ICD-10-CM | POA: Diagnosis present

## 2017-05-24 DIAGNOSIS — D649 Anemia, unspecified: Secondary | ICD-10-CM | POA: Insufficient documentation

## 2017-05-24 DIAGNOSIS — N2581 Secondary hyperparathyroidism of renal origin: Secondary | ICD-10-CM | POA: Diagnosis present

## 2017-05-24 DIAGNOSIS — G2581 Restless legs syndrome: Secondary | ICD-10-CM | POA: Diagnosis present

## 2017-05-24 DIAGNOSIS — N189 Chronic kidney disease, unspecified: Secondary | ICD-10-CM

## 2017-05-24 DIAGNOSIS — D631 Anemia in chronic kidney disease: Secondary | ICD-10-CM | POA: Diagnosis present

## 2017-05-24 DIAGNOSIS — D638 Anemia in other chronic diseases classified elsewhere: Secondary | ICD-10-CM | POA: Diagnosis present

## 2017-05-24 DIAGNOSIS — K5900 Constipation, unspecified: Secondary | ICD-10-CM | POA: Diagnosis not present

## 2017-05-24 DIAGNOSIS — Z8673 Personal history of transient ischemic attack (TIA), and cerebral infarction without residual deficits: Secondary | ICD-10-CM | POA: Diagnosis not present

## 2017-05-24 DIAGNOSIS — Z599 Problem related to housing and economic circumstances, unspecified: Secondary | ICD-10-CM

## 2017-05-24 DIAGNOSIS — K922 Gastrointestinal hemorrhage, unspecified: Secondary | ICD-10-CM

## 2017-05-24 DIAGNOSIS — E785 Hyperlipidemia, unspecified: Secondary | ICD-10-CM | POA: Diagnosis present

## 2017-05-24 DIAGNOSIS — E8770 Fluid overload, unspecified: Secondary | ICD-10-CM | POA: Diagnosis present

## 2017-05-24 DIAGNOSIS — N186 End stage renal disease: Secondary | ICD-10-CM | POA: Diagnosis present

## 2017-05-24 DIAGNOSIS — Z7982 Long term (current) use of aspirin: Secondary | ICD-10-CM

## 2017-05-24 DIAGNOSIS — E876 Hypokalemia: Secondary | ICD-10-CM | POA: Diagnosis not present

## 2017-05-24 DIAGNOSIS — N179 Acute kidney failure, unspecified: Secondary | ICD-10-CM | POA: Diagnosis present

## 2017-05-24 DIAGNOSIS — I120 Hypertensive chronic kidney disease with stage 5 chronic kidney disease or end stage renal disease: Secondary | ICD-10-CM | POA: Diagnosis present

## 2017-05-24 DIAGNOSIS — I160 Hypertensive urgency: Secondary | ICD-10-CM | POA: Diagnosis present

## 2017-05-24 HISTORY — DX: Gastrointestinal hemorrhage, unspecified: K92.2

## 2017-05-24 HISTORY — DX: Nutritional anemia, unspecified: D53.9

## 2017-05-24 HISTORY — DX: Anemia, unspecified: D64.9

## 2017-05-24 LAB — URINALYSIS, ROUTINE W REFLEX MICROSCOPIC
Bilirubin Urine: NEGATIVE
GLUCOSE, UA: 50 mg/dL — AB
KETONES UR: NEGATIVE mg/dL
Nitrite: NEGATIVE
PH: 5 (ref 5.0–8.0)
PROTEIN: 100 mg/dL — AB
SQUAMOUS EPITHELIAL / LPF: NONE SEEN
Specific Gravity, Urine: 1.011 (ref 1.005–1.030)

## 2017-05-24 LAB — I-STAT VENOUS BLOOD GAS, ED
Acid-base deficit: 22 mmol/L — ABNORMAL HIGH (ref 0.0–2.0)
Bicarbonate: 6.3 mmol/L — ABNORMAL LOW (ref 20.0–28.0)
O2 Saturation: 68 %
PCO2 VEN: 20.8 mmHg — AB (ref 44.0–60.0)
PH VEN: 7.086 — AB (ref 7.250–7.430)
TCO2: 7 mmol/L — ABNORMAL LOW (ref 22–32)
pO2, Ven: 48 mmHg — ABNORMAL HIGH (ref 32.0–45.0)

## 2017-05-24 LAB — CBC
HCT: 16.9 % — ABNORMAL LOW (ref 39.0–52.0)
Hemoglobin: 5.7 g/dL — CL (ref 13.0–17.0)
MCH: 27.4 pg (ref 26.0–34.0)
MCHC: 33.7 g/dL (ref 30.0–36.0)
MCV: 81.3 fL (ref 78.0–100.0)
PLATELETS: 247 10*3/uL (ref 150–400)
RBC: 2.08 MIL/uL — AB (ref 4.22–5.81)
RDW: 13.5 % (ref 11.5–15.5)
WBC: 6.6 10*3/uL (ref 4.0–10.5)

## 2017-05-24 LAB — COMPREHENSIVE METABOLIC PANEL
ALT: 11 U/L — AB (ref 17–63)
AST: 6 U/L — AB (ref 15–41)
Albumin: 2.7 g/dL — ABNORMAL LOW (ref 3.5–5.0)
Alkaline Phosphatase: 65 U/L (ref 38–126)
BILIRUBIN TOTAL: 0.5 mg/dL (ref 0.3–1.2)
BUN: 225 mg/dL — AB (ref 6–20)
CALCIUM: 8.9 mg/dL (ref 8.9–10.3)
CO2: <7 mmol/L — ABNORMAL LOW (ref 22–32)
CREATININE: 15.72 mg/dL — AB (ref 0.61–1.24)
Chloride: 97 mmol/L — ABNORMAL LOW (ref 101–111)
GFR, EST AFRICAN AMERICAN: 3 mL/min — AB (ref >60)
GFR, EST NON AFRICAN AMERICAN: 3 mL/min — AB (ref >60)
Glucose, Bld: 119 mg/dL — ABNORMAL HIGH (ref 65–99)
Potassium: 5.2 mmol/L — ABNORMAL HIGH (ref 3.5–5.1)
Sodium: 131 mmol/L — ABNORMAL LOW (ref 135–145)
Total Protein: 5.9 g/dL — ABNORMAL LOW (ref 6.5–8.1)

## 2017-05-24 LAB — VITAMIN B12: VITAMIN B 12: 815 pg/mL (ref 180–914)

## 2017-05-24 LAB — IRON AND TIBC
IRON: 68 ug/dL (ref 45–182)
Saturation Ratios: 23 % (ref 17.9–39.5)
TIBC: 295 ug/dL (ref 250–450)
UIBC: 227 ug/dL

## 2017-05-24 LAB — RETICULOCYTES
RBC.: 2.08 MIL/uL — AB (ref 4.22–5.81)
RETIC COUNT ABSOLUTE: 91.5 10*3/uL (ref 19.0–186.0)
Retic Ct Pct: 4.4 % — ABNORMAL HIGH (ref 0.4–3.1)

## 2017-05-24 LAB — POC OCCULT BLOOD, ED: Fecal Occult Bld: NEGATIVE

## 2017-05-24 LAB — FOLATE: FOLATE: 32 ng/mL (ref >5.9)

## 2017-05-24 LAB — PREPARE RBC (CROSSMATCH)

## 2017-05-24 LAB — FERRITIN: Ferritin: 109 ng/mL (ref 24–336)

## 2017-05-24 LAB — LIPASE, BLOOD: LIPASE: 44 U/L (ref 11–51)

## 2017-05-24 MED ORDER — MORPHINE SULFATE (PF) 4 MG/ML IV SOLN
2.0000 mg | INTRAVENOUS | Status: DC | PRN
  Administered 2017-05-25 (×2): 4 mg via INTRAVENOUS
  Filled 2017-05-24 (×3): qty 1

## 2017-05-24 MED ORDER — ONDANSETRON HCL 4 MG/2ML IJ SOLN
4.0000 mg | Freq: Once | INTRAMUSCULAR | Status: AC
  Administered 2017-05-24: 4 mg via INTRAVENOUS
  Filled 2017-05-24: qty 2

## 2017-05-24 MED ORDER — ATORVASTATIN CALCIUM 10 MG PO TABS
10.0000 mg | ORAL_TABLET | Freq: Every day | ORAL | Status: DC
  Administered 2017-05-26 – 2017-06-01 (×7): 10 mg via ORAL
  Filled 2017-05-24 (×8): qty 1

## 2017-05-24 MED ORDER — SODIUM CHLORIDE 0.9% FLUSH: 3.0000 mL | INTRAVENOUS | Status: DC | PRN

## 2017-05-24 MED ORDER — MORPHINE SULFATE (PF) 4 MG/ML IV SOLN
4.0000 mg | Freq: Once | INTRAVENOUS | Status: AC
  Administered 2017-05-24: 4 mg via INTRAVENOUS
  Filled 2017-05-24: qty 1

## 2017-05-24 MED ORDER — SODIUM CHLORIDE 0.9 % IV SOLN: 250.0000 mL | INTRAVENOUS | Status: DC | PRN

## 2017-05-24 MED ORDER — IOPAMIDOL (ISOVUE-300) INJECTION 61%
INTRAVENOUS | Status: AC
  Filled 2017-05-24: qty 30

## 2017-05-24 MED ORDER — AMLODIPINE BESYLATE 10 MG PO TABS
10.0000 mg | ORAL_TABLET | Freq: Every day | ORAL | Status: DC
  Administered 2017-05-25 – 2017-06-01 (×7): 10 mg via ORAL
  Filled 2017-05-24: qty 2
  Filled 2017-05-24 (×6): qty 1

## 2017-05-24 MED ORDER — SODIUM CHLORIDE 0.9 % IV BOLUS (SEPSIS): 500.0000 mL | Freq: Once | INTRAVENOUS | Status: DC

## 2017-05-24 MED ORDER — SODIUM CHLORIDE 0.9 % IV SOLN
10.0000 mL/h | Freq: Once | INTRAVENOUS | Status: AC
  Administered 2017-05-24: 10 mL/h via INTRAVENOUS

## 2017-05-24 MED ORDER — DIPHENHYDRAMINE HCL 12.5 MG/5ML PO ELIX
50.0000 mg | ORAL_SOLUTION | Freq: Every evening | ORAL | Status: DC | PRN
  Filled 2017-05-24: qty 20

## 2017-05-24 MED ORDER — SODIUM CHLORIDE 0.9 % IV BOLUS (SEPSIS)
1000.0000 mL | Freq: Once | INTRAVENOUS | Status: AC
  Administered 2017-05-24: 1000 mL via INTRAVENOUS

## 2017-05-24 MED ORDER — ACETAMINOPHEN 650 MG RE SUPP: 650.0000 mg | Freq: Four times a day (QID) | RECTAL | Status: DC | PRN

## 2017-05-24 MED ORDER — SODIUM CHLORIDE 0.9% FLUSH
3.0000 mL | Freq: Two times a day (BID) | INTRAVENOUS | Status: DC
  Administered 2017-05-25 – 2017-05-27 (×6): 3 mL via INTRAVENOUS

## 2017-05-24 MED ORDER — HYDRALAZINE HCL 20 MG/ML IJ SOLN
10.0000 mg | INTRAMUSCULAR | Status: DC | PRN
  Administered 2017-05-25: 10 mg via INTRAVENOUS
  Filled 2017-05-24: qty 1

## 2017-05-24 MED ORDER — ACETAMINOPHEN 325 MG PO TABS: 650.0000 mg | ORAL_TABLET | Freq: Four times a day (QID) | ORAL | Status: DC | PRN

## 2017-05-24 MED ORDER — ALBUTEROL SULFATE (2.5 MG/3ML) 0.083% IN NEBU: 2.5000 mg | INHALATION_SOLUTION | RESPIRATORY_TRACT | Status: DC | PRN

## 2017-05-24 MED ORDER — METOPROLOL TARTRATE 50 MG PO TABS
75.0000 mg | ORAL_TABLET | Freq: Two times a day (BID) | ORAL | Status: DC
  Administered 2017-05-25 – 2017-06-01 (×14): 75 mg via ORAL
  Filled 2017-05-24 (×9): qty 1
  Filled 2017-05-24: qty 3
  Filled 2017-05-24: qty 1
  Filled 2017-05-24: qty 3
  Filled 2017-05-24 (×2): qty 1

## 2017-05-24 MED ORDER — CALCITRIOL 0.25 MCG PO CAPS: 0.2500 ug | ORAL_CAPSULE | ORAL | Status: DC

## 2017-05-24 MED ORDER — SODIUM CHLORIDE 0.9% FLUSH
3.0000 mL | Freq: Two times a day (BID) | INTRAVENOUS | Status: DC
  Administered 2017-05-25 – 2017-05-26 (×3): 3 mL via INTRAVENOUS

## 2017-05-24 MED ORDER — HYDRALAZINE HCL 50 MG PO TABS
50.0000 mg | ORAL_TABLET | Freq: Three times a day (TID) | ORAL | Status: DC
  Administered 2017-05-25 – 2017-06-01 (×19): 50 mg via ORAL
  Filled 2017-05-24 (×13): qty 1
  Filled 2017-05-24: qty 2
  Filled 2017-05-24 (×2): qty 1
  Filled 2017-05-24: qty 2
  Filled 2017-05-24 (×3): qty 1

## 2017-05-24 MED ORDER — PANTOPRAZOLE SODIUM 40 MG IV SOLR
40.0000 mg | INTRAVENOUS | Status: DC
Start: 2017-05-25 — End: 2017-05-29
  Administered 2017-05-25 – 2017-05-29 (×4): 40 mg via INTRAVENOUS
  Filled 2017-05-24 (×4): qty 40

## 2017-05-24 NOTE — ED Provider Notes (Signed)
[facility] DEPT Provider Note   CSN: [id number] Arrival date & time: 05/24/17  1700     History   Chief Complaint Chief Complaint  Patient presents with  . Abdominal Pain    HPI Thomas Robinson is a 56 y.o. male.  The history is provided by the patient.  Abdominal Pain   This is a new problem. The current episode started more than 1 week ago. The problem occurs daily. The problem has been gradually worsening. The pain is associated with eating. The pain is located in the generalized abdominal region. The pain is moderate. Associated symptoms include diarrhea, hematochezia, melena, nausea and vomiting. Pertinent negatives include fever and dysuria. Nothing aggravates the symptoms. Nothing relieves the symptoms.   56 year old male who presents with intermittent abdominal pain. He has a history of previous CVA, hypertension, hyperlipidemia, chronic kidney disease. He reports at 16 months of decreased appetite with weight loss. Over the past week he has had intermittent sharp abdominal cramping across the abdomen. He states that after eating she often will vomit. Vomit is nonbloody and nonbilious. Has been having intermittent episodes of black tarry stools, occasional bloody stools, and diarrhea. No fevers or chills. Has had an episode of syncope. Has had extreme fatigue and weakness. No etoh abuse. Occasionally will take ibuprofen.    Past Medical History:  Diagnosis Date  . Asthma   . Chronic kidney disease   . Hyperlipidemia   . Hypertension   . Stroke ([place])   . Symptomatic anemia 05/24/2017    Patient Active Problem List   Diagnosis Date Noted  . CKD (chronic kidney disease), stage V ([place]) 05/24/2017  . Symptomatic anemia 05/24/2017  . Uremia 05/24/2017  . GI bleeding 05/24/2017  . Metabolic acidosis 30/86/5784  . Hypertensive urgency 05/24/2017  . Cerebral infarction due to thrombosis of cerebral artery ([place])   . AKI (acute kidney injury) ([place]) 04/10/2015  . History  of completed stroke   . Hypertension, essential 04/09/2015    Past Surgical History:  Procedure Laterality Date  . RINOPLASTY         Home Medications    Prior to Admission medications   Medication Sig Start Date End Date Taking? Authorizing Provider  albuterol (PROVENTIL HFA;VENTOLIN HFA) 108 (90 BASE) MCG/ACT inhaler Inhale 1-2 puffs into the lungs every 6 (six) hours as needed for wheezing or shortness of breath. 04/12/15  Yes [name], MD  aspirin 81 MG chewable tablet Chew 1 tablet (81 mg total) by mouth daily. 04/12/15  Yes [name], MD  atorvastatin (LIPITOR) 10 MG tablet Take 1 tablet (10 mg total) by mouth daily at 6 PM. 04/12/15  Yes [name], MD  calcitRIOL (ROCALTROL) 0.25 MCG capsule Take 0.25 mcg by mouth See admin instructions. Take 1 capsule (0.25 mcg) by mouth 1st night, then take 2 capsules (0.5 mcg) 2nd night, then repeat   Yes [provider]  DiphenhydrAMINE HCl (ZZZQUIL) 50 MG/30ML LIQD Take 30 mLs by mouth at bedtime as needed (for sleep).   Yes [provider]  furosemide (LASIX) 80 MG tablet Take 1 tablet (80 mg total) by mouth daily. 04/12/15  Yes [name], MD  acetaminophen (TYLENOL) 500 MG tablet Take 1,000 mg by mouth every 4 (four) hours as needed for mild pain or headache. Reported on 12/17/2015    [provider]  amLODipine (NORVASC) 10 MG tablet Take 1 tablet (10 mg total) by mouth daily. 03/07/16   [name], [name], MD  hydrALAZINE (APRESOLINE) 25 MG tablet Take 2 tablets (50 mg total) by mouth 3 (three) times daily. Patient taking differently: Take 50 mg by mouth 2 (two) times daily.  12/17/15   [name], [name], MD  metoprolol (LOPRESSOR) 50 MG tablet Take 1.5 tablets (75 mg total) by mouth 2 (two) times daily. 09/15/16   [name], [name], MD    Family History Family History  Problem Relation Age of Onset  . Cancer Mother     Social History Social History  Substance Use Topics  .  Smoking status: Never Smoker  . Smokeless tobacco: Never Used  . Alcohol use No     Comment: fORMER DRINKER     Allergies   Patient has no known allergies.   Review of Systems Review of Systems  Constitutional: Negative for fever.  Gastrointestinal: Positive for abdominal pain, diarrhea, hematochezia, melena, nausea and vomiting.  Genitourinary: Negative for dysuria.  All other systems reviewed and are negative.    Physical Exam Updated Vital Signs BP (!) 173/80   Pulse 87   Temp 97.8 F (36.6 C) (Oral)   Resp 14   Ht 5\' 8"  (1.727 m)   Wt 60.8 kg (134 lb)   SpO2 95%   BMI 20.37 kg/m   Physical Exam Physical Exam  Nursing note and vitals reviewed. Constitutional: Pale malnourished appearing non-toxic, and in no acute distress Head: Normocephalic and atraumatic.  Eye: conjunctival pallor Mouth/Throat: Oropharynx is clear and moist.  Neck: Normal range of motion. Neck supple.  Cardiovascular: Normal rate and regular rhythm.   Pulmonary/Chest: Effort normal and breath sounds normal.  Abdominal: Soft. There is mild generalized tenderness. There is no rebound and no guarding. Brown stool on rectal exam Musculoskeletal: Normal range of motion.  Neurological: Alert, no facial droop, fluent speech, moves all extremities symmetrically Skin: Skin is warm and dry.  Psychiatric: Cooperative   ED Treatments / Results  Labs (all labs ordered are listed, but only abnormal results are displayed) Labs Reviewed  COMPREHENSIVE METABOLIC PANEL - Abnormal; Notable for the following:       Result Value   Sodium 131 (*)    Potassium 5.2 (*)    Chloride 97 (*)    CO2 <7 (*)    Glucose, Bld 119 (*)    BUN 225 (*)    Creatinine, Ser 15.72 (*)    Total Protein 5.9 (*)    Albumin 2.7 (*)    AST 6 (*)    ALT 11 (*)    GFR calc non Af Amer 3 (*)    GFR calc Af Amer 3 (*)    All other components within normal limits  CBC - Abnormal; Notable for the following:    RBC 2.08 (*)      Hemoglobin 5.7 (*)    HCT 16.9 (*)    All other components within normal limits  URINALYSIS, ROUTINE W REFLEX MICROSCOPIC - Abnormal; Notable for the following:    APPearance HAZY (*)    Glucose, UA 50 (*)    Hgb urine dipstick SMALL (*)    Protein, ur 100 (*)    Leukocytes, UA TRACE (*)    Bacteria, UA RARE (*)    All other components within normal limits  RETICULOCYTES - Abnormal; Notable for the following:    Retic Ct Pct 4.4 (*)    RBC. 2.08 (*)    All other components within normal limits  I-STAT VENOUS BLOOD GAS, ED - Abnormal; Notable for the following:  pH, Ven 7.086 (*)    pCO2, Ven 20.8 (*)    pO2, Ven 48.0 (*)    Bicarbonate 6.3 (*)    TCO2 7 (*)    Acid-base deficit 22.0 (*)    All other components within normal limits  LIPASE, BLOOD  VITAMIN B12  FOLATE  IRON AND TIBC  FERRITIN  POC OCCULT BLOOD, ED  TYPE AND SCREEN  PREPARE RBC (CROSSMATCH)  ABO/RH    EKG  EKG Interpretation  Date/Time:  Sunday May 24 2017 21:15:28 EDT Ventricular Rate:  87 PR Interval:    QRS Duration: 91 QT Interval:  392 QTC Calculation: 472 R Axis:   65 Text Interpretation:  Age not entered, assumed to be  56 years old for purpose of ECG interpretation Sinus rhythm Borderline prolonged PR interval Biatrial enlargement LVH with secondary repolarization abnormality Confirmed by Brantley Stage [phone]) on 05/24/2017 9:24:56 PM       Radiology No results found.  Procedures Procedures (including critical care time) CRITICAL CARE Performed by: Forde Dandy   Total critical care time: 40 minutes  Critical care time was exclusive of separately billable procedures and treating other patients.  Critical care was necessary to treat or prevent imminent or life-threatening deterioration.  Critical care was time spent personally by me on the following activities: development of treatment plan with patient and/or surrogate as well as nursing, discussions with consultants,  evaluation of patient's response to treatment, examination of patient, obtaining history from patient or surrogate, ordering and performing treatments and interventions, ordering and review of laboratory studies, ordering and review of radiographic studies, pulse oximetry and re-evaluation of patient's condition.   Medications Ordered in ED Medications  iopamidol (ISOVUE-300) 61 % injection (not administered)  0.9 %  sodium chloride infusion (0 mL/hr Intravenous Stopped 05/24/17 2110)  ondansetron (ZOFRAN) injection 4 mg (4 mg Intravenous Given 05/24/17 1913)  sodium chloride 0.9 % bolus 1,000 mL (0 mLs Intravenous Stopped 05/24/17 2110)  morphine 4 MG/ML injection 4 mg (4 mg Intravenous Given 05/24/17 2111)     Initial Impression / Assessment and Plan / ED Course  I have reviewed the triage vital signs and the nursing notes.  Pertinent labs & imaging results that were available during my care of the patient were reviewed by me and considered in my medical decision making (see chart for details).     Patient in acute renal failure. He has a creatinine of 15, BUN 225, with severe metabolic acidosis. Potassium of 5.2. Has been seen by nephrology in the past, but states that he was lost to follow-up if he did not want to think about being on dialysis. Patient likely needing dialysis now. Consult to Dr. Jonnie Finner from nephrology who will plan on dialysis for him. Patient also anemic and showing signs of symptomatic anemia. Transfused 2 units of blood. No signs of active GI bleeding. Discussed with Dr. Myna Hidalgo who will admit.  Final Clinical Impressions(s) / ED Diagnoses   Final diagnoses:  Acute renal failure, unspecified acute renal failure type ([place])  Metabolic acidosis  Symptomatic anemia    New Prescriptions New Prescriptions   No medications on file     Forde Dandy, MD 05/24/17 2127

## 2017-05-24 NOTE — Consult Note (Signed)
Renal Service Consult Note [facility]  Alvah Lagrow 05/24/2017 [place] Requesting Physician:  Dr. Oleta Mouse, ED [facility]  Reason for Consult:  Renal failure HPI: The patient is a 56 y.o. year-old with hx of HTN and CKD, was f/b Dr Mercy Moore at [facility] but stopped going over a year ago.  Thought he could "lick this thing myself".  Over last several months has experienced progression anorexia , then nausea and no regular vomiting on a daily basis.  Also headaches, excessive sleepiness, chest pain, back pain and restless legs type symtpoms. In ED BUN is 255 and Cr 15.  Asked to see for dialysis.    Patient grew up in [place], work for a [employer] based out of [place].  Single, lives with a friend.  No tob/ etoh, no surgical history.     ROS  no joint pain   no HA  no blurry vision  no rash   Past Medical History  Past Medical History:  Diagnosis Date  . Asthma   . Chronic kidney disease   . Hyperlipidemia   . Hypertension   . Stroke [facility])    Past Surgical History  Past Surgical History:  Procedure Laterality Date  . RINOPLASTY     Family History  Family History  Problem Relation Age of Onset  . Cancer Mother    Social History  reports that he has never smoked. He has never used smokeless tobacco. He reports that he does not drink alcohol or use drugs. Allergies No Known Allergies Home medications Prior to Admission medications   Medication Sig Start Date End Date Taking? Authorizing Provider  albuterol (PROVENTIL HFA;VENTOLIN HFA) 108 (90 BASE) MCG/ACT inhaler Inhale 1-2 puffs into the lungs every 6 (six) hours as needed for wheezing or shortness of breath. 04/12/15  Yes [name], MD  aspirin 81 MG chewable tablet Chew 1 tablet (81 mg total) by mouth daily. 04/12/15  Yes [name], MD  atorvastatin (LIPITOR) 10 MG tablet Take 1 tablet (10 mg total) by mouth daily at 6 PM. 04/12/15  Yes [name], MD  calcitRIOL (ROCALTROL)  0.25 MCG capsule Take 0.25 mcg by mouth See admin instructions. Take 1 capsule (0.25 mcg) by mouth 1st night, then take 2 capsules (0.5 mcg) 2nd night, then repeat   Yes [provider]  DiphenhydrAMINE HCl (ZZZQUIL) 50 MG/30ML LIQD Take 30 mLs by mouth at bedtime as needed (for sleep).   Yes [provider]  furosemide (LASIX) 80 MG tablet Take 1 tablet (80 mg total) by mouth daily. 04/12/15  Yes [name], MD  acetaminophen (TYLENOL) 500 MG tablet Take 1,000 mg by mouth every 4 (four) hours as needed for mild pain or headache. Reported on 12/17/2015    [provider]  amLODipine (NORVASC) 10 MG tablet Take 1 tablet (10 mg total) by mouth daily. 03/07/16   [name], [name], MD  hydrALAZINE (APRESOLINE) 25 MG tablet Take 2 tablets (50 mg total) by mouth 3 (three) times daily. Patient taking differently: Take 50 mg by mouth 2 (two) times daily.  12/17/15   [name], [name], MD  metoprolol (LOPRESSOR) 50 MG tablet Take 1.5 tablets (75 mg total) by mouth 2 (two) times daily. 09/15/16   [name], [name], MD   Liver Function Tests  Recent Labs Lab 05/24/17 1720  AST 6*  ALT 11*  ALKPHOS 65  BILITOT 0.5  PROT 5.9*  ALBUMIN 2.7*    Recent Labs Lab  05/24/17 1720  LIPASE 44   CBC  Recent Labs Lab 05/24/17 1720  WBC 6.6  HGB 5.7*  HCT 16.9*  MCV 81.3  PLT 003   Basic Metabolic Panel  Recent Labs Lab 05/24/17 1720  NA 131*  K 5.2*  CL 97*  CO2 <7*  GLUCOSE 119*  BUN 225*  CREATININE 15.72*  CALCIUM 8.9   Iron/TIBC/Ferritin/ %Sat    Component Value Date/Time   IRON 68 05/24/2017 1857   TIBC 295 05/24/2017 1857   FERRITIN 109 05/24/2017 1857   IRONPCTSAT 23 05/24/2017 1857    Vitals:   05/24/17 2000 05/24/17 2015 05/24/17 2030 05/24/17 2102  BP: (!) 180/85  (!) 179/80 (!) 191/83  Pulse: 86 87 88 88  Resp: 14 13 13 16   Temp:    98 F (36.7 C)  TempSrc:    Oral  SpO2: 98% 99% 97% 95%  Weight:      Height:       Exam Gen tired  appearing, no distress No rash, cyanosis or gangrene Sclera anicteric, throat clear and dry  No jvd or bruits Chest clear bilat to bases RRR no MRG Abd soft ntnd no mass or ascites +bs GU normal male MS no joint effusions or deformity Ext trace LE edema / no wounds or ulcers Neuro is alert, Ox 3 , nf  Na 131 K 5.2  CO2 < 7  BUN 225  Cr 15  Alb 2.7  LFT's ok  WBC 6  Hb 5.7      Impression: 1.  Renal failure - known CKD, lost to f/u, now severely azotemic and uremic.  Stable clinically.  Getting prbc's in ED, wouldn't give a lot of IVF's though.  Needs access and dialysis , will consult IR for [facility]. HD orders written.  2.  Volume - slightly vol expanded 3.  Metabolic acidosis - will Rx with dialysis  4.  Anemia of CKD - check stool guiac, Fe stores, start esa  5.  HTN - bp's up a bit    Plan - as above  [name] MD [employer] pager [phone]   05/24/2017, 9:08 PM

## 2017-05-24 NOTE — H&P (Signed)
History and Physical    Willliam Pettet DXI:[id number] DOB: 19-Apr-1961 DOA: 05/24/2017  PCP: Patient, No Pcp Per   Patient coming from: Home  Chief Complaint: Malaise, fatigue, nausea, vomiting, diarrhea, anorexia, melena   HPI: Thomas Robinson is a 56 y.o. male with medical history significant for chronic kidney disease stage V, hypertension, hyperlipidemia, and history of CVA, now presented to the emergency department for evaluation of abdominal pain with nausea, vomiting, loose stools, intermittent melena, anorexia, and fatigue. Patient was followed by nephrology for CKD V, but stopped going to appointments about a year ago. over the past few months, the patient is noted progressive worsening in lethargy, malaise, anorexia, and abdominal discomfort with nausea, occasional vomiting, and loose stools. The patient reports some dark tarry stools intermittently, and has also noted some streaks of red blood in his stool. He uses Advil occasionally.   ED Course: Upon arrival to the ED, patient is found to be afebrile, saturating well on room air, hypertensive to 195/90, and with vitals otherwise stable. She panel reveals a sodium of 131, potassium 5.2, BUN 2925, and creatinine of 15.22. CBC is notable for hemoglobin of 5.7, down from 11.2 one year ago. Urinalysis is suggestive of infection. Fecal occult blood testing is negative. Patient was given a liter of normal saline and 2 units of packed red blood cells were ordered for immediate transfusion. He was treated symptomatically with morphine and Zofran. Nephrology was consulted by the ED physician, has evaluated the patient in the emergency department, and recommends medical admission for placement of dialysis catheter, followed by dialysis. Patient remained mildly hypertensive in the ED, but otherwise stable, and will be admitted to the telemetry unit for ongoing evaluation and management of acute renal failure superimposed on CKD stage V.  Review of  Systems:  All other systems reviewed and apart from HPI, are negative.  Past Medical History:  Diagnosis Date  . Asthma   . Chronic kidney disease   . Hyperlipidemia   . Hypertension   . Stroke ([place])   . Symptomatic anemia 05/24/2017    Past Surgical History:  Procedure Laterality Date  . RINOPLASTY       reports that he has never smoked. He has never used smokeless tobacco. He reports that he does not drink alcohol or use drugs.  No Known Allergies  Family History  Problem Relation Age of Onset  . Cancer Mother      Prior to Admission medications   Medication Sig Start Date End Date Taking? Authorizing Provider  albuterol (PROVENTIL HFA;VENTOLIN HFA) 108 (90 BASE) MCG/ACT inhaler Inhale 1-2 puffs into the lungs every 6 (six) hours as needed for wheezing or shortness of breath. 04/12/15  Yes [name], MD  aspirin 81 MG chewable tablet Chew 1 tablet (81 mg total) by mouth daily. 04/12/15  Yes [name], MD  atorvastatin (LIPITOR) 10 MG tablet Take 1 tablet (10 mg total) by mouth daily at 6 PM. 04/12/15  Yes [name], MD  calcitRIOL (ROCALTROL) 0.25 MCG capsule Take 0.25 mcg by mouth See admin instructions. Take 1 capsule (0.25 mcg) by mouth 1st night, then take 2 capsules (0.5 mcg) 2nd night, then repeat   Yes [provider]  DiphenhydrAMINE HCl (ZZZQUIL) 50 MG/30ML LIQD Take 30 mLs by mouth at bedtime as needed (for sleep).   Yes [provider]  furosemide (LASIX) 80 MG tablet Take 1 tablet (80 mg total) by mouth daily. 04/12/15  Yes Doree Barthel  L, MD  acetaminophen (TYLENOL) 500 MG tablet Take 1,000 mg by mouth every 4 (four) hours as needed for mild pain or headache. Reported on 12/17/2015    [provider]  amLODipine (NORVASC) 10 MG tablet Take 1 tablet (10 mg total) by mouth daily. 03/07/16   [name], [name], MD  hydrALAZINE (APRESOLINE) 25 MG tablet Take 2 tablets (50 mg total) by mouth 3 (three) times  daily. Patient taking differently: Take 50 mg by mouth 2 (two) times daily.  12/17/15   [name], [name], MD  metoprolol (LOPRESSOR) 50 MG tablet Take 1.5 tablets (75 mg total) by mouth 2 (two) times daily. 09/15/16   [name], [name], MD    Physical Exam: Vitals:   05/24/17 2030 05/24/17 2102 05/24/17 2116 05/24/17 2120  BP: (!) 179/80 (!) 191/83 (!) 173/80 (!) 173/80  Pulse: 88 88 87 87  Resp: 13 16 14 14   Temp:  98 F (36.7 C)  97.8 F (36.6 C)  TempSrc:  Oral  Oral  SpO2: 97% 95% 95%   Weight:      Height:          Constitutional: NAD, calm, cachectic Eyes: PERTLA, lids and conjunctivae normal ENMT: Mucous membranes are moist. Posterior pharynx clear of any exudate or lesions.   Neck: normal, supple, no masses, no thyromegaly Respiratory: Basilar rales, occasional expiratory wheeze. Normal respiratory effort.   Cardiovascular: S1 & S2 heard, regular rate and rhythm. No extremity edema. JVP 9 cmH2O. Abdomen: No distension, soft, mild generalized tenderness. Bowel sounds active. Musculoskeletal: no clubbing / cyanosis. No joint deformity upper and lower extremities.   Skin: no significant rashes, lesions, ulcers. Warm, dry, well-perfused. Neurologic: CN 2-12 grossly intact. Sensation intact. Strength 5/5 in all 4 limbs.  Psychiatric: Alert and oriented x 3. Depressed, but calm and cooperative.     Labs on Admission: I have personally reviewed following labs and imaging studies  CBC:  Recent Labs Lab 05/24/17 1720  WBC 6.6  HGB 5.7*  HCT 16.9*  MCV 81.3  PLT 950   Basic Metabolic Panel:  Recent Labs Lab 05/24/17 1720  NA 131*  K 5.2*  CL 97*  CO2 <7*  GLUCOSE 119*  BUN 225*  CREATININE 15.72*  CALCIUM 8.9   GFR: Estimated Creatinine Clearance: 4.4 mL/min (A) (by C-G formula based on SCr of 15.72 mg/dL (H)). Liver Function Tests:  Recent Labs Lab 05/24/17 1720  AST 6*  ALT 11*  ALKPHOS 65  BILITOT 0.5  PROT 5.9*  ALBUMIN 2.7*    Recent  Labs Lab 05/24/17 1720  LIPASE 44   No results for input(s): AMMONIA in the last 168 hours. Coagulation Profile: No results for input(s): INR, PROTIME in the last 168 hours. Cardiac Enzymes: No results for input(s): CKTOTAL, CKMB, CKMBINDEX, TROPONINI in the last 168 hours. BNP (last 3 results) No results for input(s): PROBNP in the last 8760 hours. HbA1C: No results for input(s): HGBA1C in the last 72 hours. CBG: No results for input(s): GLUCAP in the last 168 hours. Lipid Profile: No results for input(s): CHOL, HDL, LDLCALC, TRIG, CHOLHDL, LDLDIRECT in the last 72 hours. Thyroid Function Tests: No results for input(s): TSH, T4TOTAL, FREET4, T3FREE, THYROIDAB in the last 72 hours. Anemia Panel:  Recent Labs  05/24/17 1857  VITAMINB12 815  FOLATE 32.0  FERRITIN 109  TIBC 295  IRON 68  RETICCTPCT 4.4*   Urine analysis:    Component Value Date/Time   COLORURINE YELLOW 05/24/2017 1729  APPEARANCEUR HAZY (A) 05/24/2017 1729   LABSPEC 1.011 05/24/2017 1729   PHURINE 5.0 05/24/2017 1729   GLUCOSEU 50 (A) 05/24/2017 1729   HGBUR SMALL (A) 05/24/2017 1729   BILIRUBINUR NEGATIVE 05/24/2017 1729   KETONESUR NEGATIVE 05/24/2017 1729   PROTEINUR 100 (A) 05/24/2017 1729   UROBILINOGEN 0.2 04/09/2015 1527   NITRITE NEGATIVE 05/24/2017 1729   LEUKOCYTESUR TRACE (A) 05/24/2017 1729   Sepsis Labs: @LABRCNTIP (procalcitonin:4,lacticidven:4) )No results found for this or any previous visit (from the past 240 hour(s)).   Radiological Exams on Admission: No results found.  EKG: Ordered, pending. NSR on telemetry monitor.   Assessment/Plan  1. Acute kidney injury superimposed on CKD stage V  - Pt was followed by nephrology for severe CKD, but stopped going to appts  - Now presents with several months of progressive malaise, fatigue, anorexia, N/V/D, and is found to have BUN 225 and Cr 15.7 - There is mild hyperkalemia, mild hypervolemia, and severe acidosis  - Nephrology is  consulting and much appreciated, planning for HD following TDC placement   2. Symptomatic anemia  - Hgb is 5.7 on admission, down from 11.2 one yr earlier   - Pt reports intermittent melena and occasional red streaks on stool  - No hypotension or tachycardia here and FOBT is negative in ED  - Two units of pRBC's ordered for immediate transfusion in ED  - Plan to check post-transfusion H&H, start Protonix 40 mg IV qD  3. Hypertension with hypertensive urgency  - BP 195/90 in ED  - Continue Norvasc, Lopressor, and hydralazine  - Anticipate improvement with HD   - Use hydralazine IVP's prn   4. Metabolic acidosis  - Serum bicarb undetectably low on admission  - Nephrology is following and planning to treat this with HD  - Repeat chem panel in am    5. Hyperkalemia  - Serum potassium slightly elevated to 5.2 on admission - EKG pending, NSR noted on telemetry monitor  - Nephrology planing for HD  - Continue cardiac monitoring, repeat chem panel in am     DVT prophylaxis: sq heparin Code Status: Full  Family Communication: Discussed with patient Disposition Plan: Admit to telemetry Consults called: Nephrology Admission status: Inpatient    Vianne Bulls, MD [facility] Pager [phone]  If 7PM-7AM, please contact night-coverage [email or web address] Password [facility]  05/24/2017, 9:40 PM

## 2017-05-24 NOTE — ED Triage Notes (Signed)
Pod e edp  Given lab results pt to go back next

## 2017-05-24 NOTE — ED Triage Notes (Signed)
pts hgb of 5.7  Increased aaquity  edp notified

## 2017-05-24 NOTE — ED Triage Notes (Signed)
C/o sharp pain across abd with nausea, vomiting, and diarrhea x 1 week.  Reports intermittent episode of difficulty urinating.

## 2017-05-25 ENCOUNTER — Encounter (HOSPITAL_COMMUNITY): Payer: Self-pay | Admitting: Interventional Radiology

## 2017-05-25 ENCOUNTER — Inpatient Hospital Stay (HOSPITAL_COMMUNITY): Payer: Medicaid Other

## 2017-05-25 DIAGNOSIS — I10 Essential (primary) hypertension: Secondary | ICD-10-CM

## 2017-05-25 DIAGNOSIS — N185 Chronic kidney disease, stage 5: Secondary | ICD-10-CM

## 2017-05-25 HISTORY — PX: IR US GUIDE VASC ACCESS RIGHT: IMG2390

## 2017-05-25 HISTORY — PX: IR FLUORO GUIDE CV LINE RIGHT: IMG2283

## 2017-05-25 LAB — BASIC METABOLIC PANEL
Anion gap: 25 — ABNORMAL HIGH (ref 5–15)
BUN: 222 mg/dL — AB (ref 6–20)
CALCIUM: 8.8 mg/dL — AB (ref 8.9–10.3)
CHLORIDE: 101 mmol/L (ref 101–111)
CO2: 8 mmol/L — AB (ref 22–32)
CREATININE: 15.18 mg/dL — AB (ref 0.61–1.24)
GFR calc non Af Amer: 3 mL/min — ABNORMAL LOW (ref >60)
GFR, EST AFRICAN AMERICAN: 4 mL/min — AB (ref >60)
Glucose, Bld: 88 mg/dL (ref 65–99)
Potassium: 5.5 mmol/L — ABNORMAL HIGH (ref 3.5–5.1)
SODIUM: 134 mmol/L — AB (ref 135–145)

## 2017-05-25 LAB — CBC
HCT: 24.1 % — ABNORMAL LOW (ref 39.0–52.0)
HCT: 22.7 % — ABNORMAL LOW (ref 39.0–52.0)
Hemoglobin: 8 g/dL — ABNORMAL LOW (ref 13.0–17.0)
Hemoglobin: 7.5 g/dL — ABNORMAL LOW (ref 13.0–17.0)
MCH: 27.5 pg (ref 26.0–34.0)
MCH: 27.4 pg (ref 26.0–34.0)
MCHC: 33.2 g/dL (ref 30.0–36.0)
MCHC: 33 g/dL (ref 30.0–36.0)
MCV: 82.8 fL (ref 78.0–100.0)
MCV: 82.8 fL (ref 78.0–100.0)
PLATELETS: 203 10*3/uL (ref 150–400)
Platelets: 213 10*3/uL (ref 150–400)
RBC: 2.91 MIL/uL — AB (ref 4.22–5.81)
RBC: 2.74 MIL/uL — ABNORMAL LOW (ref 4.22–5.81)
RDW: 14.2 % (ref 11.5–15.5)
RDW: 14.5 % (ref 11.5–15.5)
WBC: 7.8 10*3/uL (ref 4.0–10.5)
WBC: 8.9 10*3/uL (ref 4.0–10.5)

## 2017-05-25 LAB — RENAL FUNCTION PANEL
Albumin: 2.6 g/dL — ABNORMAL LOW (ref 3.5–5.0)
BUN: 227 mg/dL — ABNORMAL HIGH (ref 6–20)
CO2: <7 mmol/L — ABNORMAL LOW (ref 22–32)
Calcium: 9 mg/dL (ref 8.9–10.3)
Chloride: 100 mmol/L — ABNORMAL LOW (ref 101–111)
Creatinine, Ser: 15.74 mg/dL — ABNORMAL HIGH (ref 0.61–1.24)
GFR calc Af Amer: 3 mL/min — ABNORMAL LOW (ref >60)
GFR calc non Af Amer: 3 mL/min — ABNORMAL LOW (ref >60)
Glucose, Bld: 101 mg/dL — ABNORMAL HIGH (ref 65–99)
Phosphorus: 4.7 mg/dL — ABNORMAL HIGH (ref 2.5–4.6)
Potassium: 5.7 mmol/L — ABNORMAL HIGH (ref 3.5–5.1)
Sodium: 132 mmol/L — ABNORMAL LOW (ref 135–145)

## 2017-05-25 LAB — HEPATITIS B SURFACE ANTIGEN
HEP B S AG: NEGATIVE
Hepatitis B Surface Ag: NEGATIVE

## 2017-05-25 LAB — PROTIME-INR
INR: 1.22
PROTHROMBIN TIME: 15.3 s — AB (ref 11.4–15.2)

## 2017-05-25 LAB — CBG MONITORING, ED: GLUCOSE-CAPILLARY: 81 mg/dL (ref 65–99)

## 2017-05-25 LAB — ABO/RH: ABO/RH(D): A POS

## 2017-05-25 LAB — HIV ANTIBODY (ROUTINE TESTING W REFLEX): HIV Screen 4th Generation wRfx: NONREACTIVE

## 2017-05-25 MED ORDER — HEPARIN SODIUM (PORCINE) 1000 UNIT/ML DIALYSIS: 1000.0000 [IU] | INTRAMUSCULAR | Status: DC | PRN

## 2017-05-25 MED ORDER — LIDOCAINE HCL (PF) 1 % IJ SOLN: 5.0000 mL | INTRAMUSCULAR | Status: DC | PRN

## 2017-05-25 MED ORDER — FENTANYL CITRATE (PF) 100 MCG/2ML IJ SOLN
INTRAMUSCULAR | Status: AC
  Filled 2017-05-25: qty 4

## 2017-05-25 MED ORDER — LIDOCAINE HCL (PF) 1 % IJ SOLN
INTRAMUSCULAR | Status: AC
  Filled 2017-05-25: qty 20

## 2017-05-25 MED ORDER — HEPARIN SODIUM (PORCINE) 1000 UNIT/ML IJ SOLN
INTRAMUSCULAR | Status: AC
  Administered 2017-05-25: 1200 [IU] via INTRAVENOUS_CENTRAL
  Filled 2017-05-25: qty 1

## 2017-05-25 MED ORDER — DARBEPOETIN ALFA 40 MCG/0.4ML IJ SOSY
PREFILLED_SYRINGE | INTRAMUSCULAR | Status: AC
  Administered 2017-05-25: 40 ug via INTRAVENOUS
  Filled 2017-05-25: qty 0.4

## 2017-05-25 MED ORDER — SODIUM CHLORIDE 0.9 % IV SOLN: 100.0000 mL | INTRAVENOUS | Status: DC | PRN

## 2017-05-25 MED ORDER — DARBEPOETIN ALFA 40 MCG/0.4ML IJ SOSY
40.0000 ug | PREFILLED_SYRINGE | INTRAMUSCULAR | Status: DC
Start: 2017-05-25 — End: 2017-06-02
  Administered 2017-05-25 – 2017-06-01 (×2): 40 ug via INTRAVENOUS

## 2017-05-25 MED ORDER — RENA-VITE PO TABS
1.0000 | ORAL_TABLET | Freq: Every day | ORAL | Status: DC
  Administered 2017-05-26 – 2017-06-01 (×7): 1 via ORAL
  Filled 2017-05-25 (×8): qty 1

## 2017-05-25 MED ORDER — ALTEPLASE 2 MG IJ SOLR: 2.0000 mg | Freq: Once | INTRAMUSCULAR | Status: DC | PRN

## 2017-05-25 MED ORDER — HEPARIN SODIUM (PORCINE) 1000 UNIT/ML DIALYSIS
20.0000 [IU]/kg | INTRAMUSCULAR | Status: DC | PRN
  Administered 2017-05-25: 1200 [IU] via INTRAVENOUS_CENTRAL
  Filled 2017-05-25 (×2): qty 2

## 2017-05-25 MED ORDER — CEFAZOLIN SODIUM-DEXTROSE 2-4 GM/100ML-% IV SOLN
2.0000 g | INTRAVENOUS | Status: AC
  Administered 2017-05-25: 2 g via INTRAVENOUS
  Filled 2017-05-25: qty 100

## 2017-05-25 MED ORDER — PENTAFLUOROPROP-TETRAFLUOROETH EX AERO: 1.0000 "application " | INHALATION_SPRAY | CUTANEOUS | Status: DC | PRN

## 2017-05-25 MED ORDER — LIDOCAINE-PRILOCAINE 2.5-2.5 % EX CREA: 1.0000 "application " | TOPICAL_CREAM | CUTANEOUS | Status: DC | PRN

## 2017-05-25 MED ORDER — MIDAZOLAM HCL 2 MG/2ML IJ SOLN
INTRAMUSCULAR | Status: AC | PRN
  Administered 2017-05-25: 1 mg via INTRAVENOUS

## 2017-05-25 MED ORDER — HEPARIN SODIUM (PORCINE) 1000 UNIT/ML DIALYSIS
1000.0000 [IU] | INTRAMUSCULAR | Status: DC | PRN
  Filled 2017-05-25: qty 1

## 2017-05-25 MED ORDER — LIDOCAINE HCL (PF) 1 % IJ SOLN
INTRAMUSCULAR | Status: AC | PRN
  Administered 2017-05-25: 20 mL

## 2017-05-25 MED ORDER — MIDAZOLAM HCL 2 MG/2ML IJ SOLN
INTRAMUSCULAR | Status: AC
  Filled 2017-05-25: qty 4

## 2017-05-25 MED ORDER — HEPARIN SODIUM (PORCINE) 1000 UNIT/ML DIALYSIS: 2000.0000 [IU] | INTRAMUSCULAR | Status: DC | PRN

## 2017-05-25 MED ORDER — FENTANYL CITRATE (PF) 100 MCG/2ML IJ SOLN
INTRAMUSCULAR | Status: AC | PRN
  Administered 2017-05-25: 50 ug via INTRAVENOUS

## 2017-05-25 NOTE — Sedation Documentation (Signed)
Patient is resting comfortably. 

## 2017-05-25 NOTE — Procedures (Signed)
Interventional Radiology Procedure Note  Procedure: Placement of a right IJ approach tunneled HD catheter.  19cm tip to cuff.  Tip is positioned at the superior cavoatrial junction and catheter is ready for immediate use.  Complications: None Recommendations:  - Ok to use - Do not submerge   - Routine line care  - routine wound care  Signed,  Dulcy Fanny. Earleen Newport, DO

## 2017-05-25 NOTE — Progress Notes (Signed)
[facility] Progress Note   Assessment/Plan: 1. Uremia/metabolic acidosis - hx CKD - likely now ESRD - lost to f/u; K 5.5 BUN 222 Cr 15 CO2 8; serial gentle HD to avoid disequilibrium.  [place]; For first HD today - and again tomorrow. 2. Anemia -hgb 7.5 up to  hgb 8 after transfusion tsat  23%-pre transfusion ferritin 109 and folate 32 s/p 2 units PRBC - one on 9/23 and one on 9/24; start ESA 40 Aranesp 3. Secondary hyperparathyroidism - needs iPTH- asked RN to drawn pre HD today/likely will need binders - no health insurance - so will address after d/c; previous on calcitriol 0.25 (doubt was taking) - hold now and start hectorol when iPTH back - not clear if he took regularly 4. HTN/volume - titrate volume/meds- amazing it is as good as it is 5. Nutrition -renal diet/vits when eating 6. Access - planning - consult to [facility]- need to remove one of his IVs after TDC placed  7. Financial issues - currently no health insurance per patient -   Myriam Jacobson, PA-C [facility] Beeper [phone] 05/25/2017,1:50 PM  LOS: 1 day    Patient seen and examined, agree with above note with above modifications. Pt lost to follow up now very uremic presentation- will likely be ESRD- s/p PC and is on first HD now, second tomorrow- [place] ([place] ) He is somnolent on HD- "I have felt badly for so long I cant tell " Corliss Parish, MD 05/25/2017     Subjective:   Feels horrible. Has been taking some NSAIDs for pain.  Lost health insurange so only taking Rx meds erratically   Objective Vitals:   05/25/17 1000 05/25/17 1034 05/25/17 1035 05/25/17 1200  BP: (!) 158/75 (!) 150/70  138/65  Pulse: 75  79 73  Resp: 13   11  Temp:      TempSrc:      SpO2: 97%   95%  Weight:      Height:       Physical Exam General: thin, uremic fetor - ill appearing Heart: RRR Lungs:grossly clear Abdomen: soft NT + bs Extremities: tr LE  edema, has IVs in both arms - need to d/c one after HD cath is placed Dialysis Access:  [facility] to be placed Neuro: a little groggy - slight flap in left hand   Additional Objective Labs: Basic Metabolic Panel:  Recent Labs Lab 05/24/17 1720 05/25/17 0643  NA 131* 134*  K 5.2* 5.5*  CL 97* 101  CO2 <7* 8*  GLUCOSE 119* 88  BUN 225* 222*  CREATININE 15.72* 15.18*  CALCIUM 8.9 8.8*   Liver Function Tests:  Recent Labs Lab 05/24/17 1720  AST 6*  ALT 11*  ALKPHOS 65  BILITOT 0.5  PROT 5.9*  ALBUMIN 2.7*    Recent Labs Lab 05/24/17 1720  LIPASE 44   CBC:  Recent Labs Lab 05/24/17 1720 05/25/17 0643  WBC 6.6 7.8  HGB 5.7* 8.0*  HCT 16.9* 24.1*  MCV 81.3 82.8  PLT 247 203   CBG:  Recent Labs Lab 05/25/17 0806  GLUCAP 81   Iron Studies:  Recent Labs  05/24/17 1857  IRON 68  TIBC 295  FERRITIN 109   Lab Results  Component Value Date   INR 1.22 05/25/2017   INR 1.16 04/10/2015   INR 1.08 04/09/2015   Studies/Results: No results found. Medications: . sodium chloride    .  ceFAZolin (ANCEF) IV     . amLODipine  10 mg Oral Daily  . atorvastatin  10 mg Oral q1800  . calcitRIOL  0.25 mcg Oral See admin instructions  . hydrALAZINE  50 mg Oral TID  . metoprolol tartrate  75 mg Oral BID  . pantoprazole (PROTONIX) IV  40 mg Intravenous Q24H  . sodium chloride flush  3 mL Intravenous Q12H  . sodium chloride flush  3 mL Intravenous Q12H

## 2017-05-25 NOTE — Sedation Documentation (Signed)
Transferred back ER

## 2017-05-25 NOTE — Progress Notes (Signed)
PROGRESS NOTE  Thomas Robinson  YCX:[id number] DOB: [birth year] DOA: 05/24/2017 PCP: Patient, No Pcp Per  Brief Narrative:   Thomas Robinson is a 56 y.o. male with medical history significant for chronic kidney disease stage V, hypertension, hyperlipidemia, and history of CVA, now presented to the emergency department for evaluation of abdominal pain with nausea, vomiting, loose stools, intermittent melena, anorexia, and fatigue.  He was found to be in renal failure with  potassium 5.2, BUN 225, and creatinine of 15.22.  HD catheter placed by IR on 9/24 and awaiting hemodialysis.  Patient has no insurance.   Assessment & Plan:   Principal Problem:   AKI (acute kidney injury) ([place]) Active Problems:   Hypertension, essential   History of completed stroke   CKD (chronic kidney disease), stage V ([facility])   Symptomatic anemia   Uremia   GI bleeding   Metabolic acidosis   Hypertensive urgency   Acute kidney injury superimposed on CKD ([place])  1. ESRD - There is mild hyperkalemia, mild hypervolemia, and severe metabolic acidosis  - Nephrology to dialyze today to correct the aforementioned problems  2. Symptomatic anemia, likely due to progressive renal failure/ESRD  - Hgb is 5.7 on admission, down from 11.2 one yr earlier   -  Occult stool negative - Two units of pRBC's transfused, H&H increased appropriately -  epo and iron per Nephrology  3. Hypertension with hypertensive urgency - Continue Norvasc, Lopressor, and hydralazine  - Anticipate improvement with HD   - Continue hydralazine IVP's prn   DVT prophylaxis:  SCDs Code Status:  Full code Family Communication:  Patient alone Disposition Plan:  Home in several days with outpatient HD   Consultants:   Nephrology  IR  Procedures:  Tunneled HD catheter placement on 9/24  Antimicrobials:  Anti-infectives    Start     Dose/Rate Route Frequency Ordered Stop   05/25/17 1100  ceFAZolin (ANCEF) IVPB 2g/100 mL premix     2 g 200 mL/hr over 30 Minutes Intravenous On call 05/25/17 1014 05/25/17 1514       Subjective:  Has had uremic frost, anorexia, nausea, fatigue/sleepiness  Objective: Vitals:   05/25/17 1445 05/25/17 1450 05/25/17 1458 05/25/17 1512  BP: 121/64 118/61 119/60 122/65  Pulse: 70 69 69 68  Resp: 16 12 14 10   Temp:      TempSrc:      SpO2: 100% 98% 100% 100%  Weight:      Height:        Intake/Output Summary (Last 24 hours) at 05/25/17 1520 Last data filed at 05/25/17 1514  Gross per 24 hour  Intake             3366 ml  Output                0 ml  Net             3366 ml   Filed Weights   05/24/17 1707  Weight: 60.8 kg (134 lb)    Examination:  General exam:  Adult male.  No acute distress.  HEENT:  NCAT, MMM Respiratory system: Clear to auscultation bilaterally Cardiovascular system: Regular rate and rhythm, normal S1/S2. No murmurs, rubs, gallops or clicks.  Warm extremities Gastrointestinal system: Normal active bowel sounds, soft, nondistended, nontender. MSK:  Normal tone and bulk, 1+ pitting bilateral lower extremity edema Neuro:  Grossly intact    Data Reviewed: I have personally reviewed following labs and imaging studies  CBC:  Recent Labs Lab 05/24/17 1720 05/25/17 0643  WBC 6.6 7.8  HGB 5.7* 8.0*  HCT 16.9* 24.1*  MCV 81.3 82.8  PLT 247 299   Basic Metabolic Panel:  Recent Labs Lab 05/24/17 1720 05/25/17 0643  NA 131* 134*  K 5.2* 5.5*  CL 97* 101  CO2 <7* 8*  GLUCOSE 119* 88  BUN 225* 222*  CREATININE 15.72* 15.18*  CALCIUM 8.9 8.8*   GFR: Estimated Creatinine Clearance: 4.6 mL/min (A) (by C-G formula based on SCr of 15.18 mg/dL (H)). Liver Function Tests:  Recent Labs Lab 05/24/17 1720  AST 6*  ALT 11*  ALKPHOS 65  BILITOT 0.5  PROT 5.9*  ALBUMIN 2.7*    Recent Labs Lab 05/24/17 1720  LIPASE 44   No results for input(s): AMMONIA in the last 168 hours. Coagulation Profile:  Recent Labs Lab 05/25/17 0859  INR  1.22   Cardiac Enzymes: No results for input(s): CKTOTAL, CKMB, CKMBINDEX, TROPONINI in the last 168 hours. BNP (last 3 results) No results for input(s): PROBNP in the last 8760 hours. HbA1C: No results for input(s): HGBA1C in the last 72 hours. CBG:  Recent Labs Lab 05/25/17 0806  GLUCAP 81   Lipid Profile: No results for input(s): CHOL, HDL, LDLCALC, TRIG, CHOLHDL, LDLDIRECT in the last 72 hours. Thyroid Function Tests: No results for input(s): TSH, T4TOTAL, FREET4, T3FREE, THYROIDAB in the last 72 hours. Anemia Panel:  Recent Labs  05/24/17 1857  VITAMINB12 815  FOLATE 32.0  FERRITIN 109  TIBC 295  IRON 68  RETICCTPCT 4.4*   Urine analysis:    Component Value Date/Time   COLORURINE YELLOW 05/24/2017 1729   APPEARANCEUR HAZY (A) 05/24/2017 1729   LABSPEC 1.011 05/24/2017 1729   PHURINE 5.0 05/24/2017 1729   GLUCOSEU 50 (A) 05/24/2017 1729   HGBUR SMALL (A) 05/24/2017 1729   BILIRUBINUR NEGATIVE 05/24/2017 1729   KETONESUR NEGATIVE 05/24/2017 1729   PROTEINUR 100 (A) 05/24/2017 1729   UROBILINOGEN 0.2 04/09/2015 1527   NITRITE NEGATIVE 05/24/2017 1729   LEUKOCYTESUR TRACE (A) 05/24/2017 1729   Sepsis Labs: @LABRCNTIP (procalcitonin:4,lacticidven:4)  )No results found for this or any previous visit (from the past 240 hour(s)).    Radiology Studies: No results found.   Scheduled Meds: . fentaNYL      . heparin      . lidocaine (PF)      . midazolam      . amLODipine  10 mg Oral Daily  . atorvastatin  10 mg Oral q1800  . darbepoetin (ARANESP) injection - DIALYSIS  40 mcg Intravenous Q Mon-HD  . hydrALAZINE  50 mg Oral TID  . metoprolol tartrate  75 mg Oral BID  . multivitamin  1 tablet Oral QHS  . pantoprazole (PROTONIX) IV  40 mg Intravenous Q24H  . sodium chloride flush  3 mL Intravenous Q12H  . sodium chloride flush  3 mL Intravenous Q12H   Continuous Infusions: . sodium chloride       LOS: 1 day    Time spent: 30 min    Janece Canterbury, MD [facility] Pager [phone]  If 7PM-7AM, please contact night-coverage [email or web address] Password [email or web address] 05/25/2017, 3:20 PM

## 2017-05-25 NOTE — ED Notes (Signed)
Radiology at bedside

## 2017-05-25 NOTE — Consult Note (Signed)
HPI: Thomas Robinson is a 56 y.o. male seen in consultation for long-term dialysis access placement. He is a 56 year old gentleman with progressive renal insufficiency. He had been lost to follow-up and now presents to the hospital with severe uremic symptoms. He had a right IJ tunneled hemodialysis catheter placed by interventional radiology earlier today. He is not initiated his initial session yet. His had no prior hemodialysis access. He does not have any pacemakers or other central lines. Does have a history of stroke. He is minimally conversant. He was able to tell me that he is right handed. Very sedated.  Past Medical History:  Diagnosis Date  . Asthma   . Chronic kidney disease   . Hyperlipidemia   . Hypertension   . Stroke ([place])   . Symptomatic anemia 05/24/2017    Family History  Problem Relation Age of Onset  . Cancer Mother     SOCIAL HISTORY: Social History  Substance Use Topics  . Smoking status: Never Smoker  . Smokeless tobacco: Never Used  . Alcohol use No     Comment: fORMER DRINKER    No Known Allergies  Current Facility-Administered Medications  Medication Dose Route Frequency Provider Last Rate Last Dose  . 0.9 %  sodium chloride infusion  250 mL Intravenous PRN Opyd, Ilene Qua, MD      . 0.9 %  sodium chloride infusion  100 mL Intravenous PRN Alric Seton, PA-C      . 0.9 %  sodium chloride infusion  100 mL Intravenous PRN Alric Seton, PA-C      . acetaminophen (TYLENOL) tablet 650 mg  650 mg Oral Q6H PRN Opyd, Ilene Qua, MD       Or  . acetaminophen (TYLENOL) suppository 650 mg  650 mg Rectal Q6H PRN Opyd, Ilene Qua, MD      . albuterol (PROVENTIL) (2.5 MG/3ML) 0.083% nebulizer solution 2.5 mg  2.5 mg Nebulization Q4H PRN Opyd, Ilene Qua, MD      . alteplase (CATHFLO ACTIVASE) injection 2 mg  2 mg Intracatheter Once PRN Alric Seton, PA-C      . amLODipine (NORVASC) tablet 10 mg  10 mg Oral Daily Opyd, Ilene Qua, MD   10 mg at 05/25/17 1034   . atorvastatin (LIPITOR) tablet 10 mg  10 mg Oral q1800 Opyd, Ilene Qua, MD      . Darbepoetin Alfa (ARANESP) injection 40 mcg  40 mcg Intravenous Q Mon-HD Alric Seton, PA-C      . diphenhydrAMINE (BENADRYL) 12.5 MG/5ML elixir 50 mg  50 mg Oral QHS PRN Opyd, Ilene Qua, MD      . fentaNYL (SUBLIMAZE) 100 MCG/2ML injection           . heparin injection 1,000 Units  1,000 Units Dialysis PRN Alric Seton, PA-C      . heparin injection 1,200 Units  20 Units/kg Dialysis PRN Alric Seton, PA-C   1,200 Units at 05/25/17 1607  . hydrALAZINE (APRESOLINE) injection 10 mg  10 mg Intravenous Q4H PRN Opyd, Ilene Qua, MD      . hydrALAZINE (APRESOLINE) tablet 50 mg  50 mg Oral TID Opyd, Ilene Qua, MD   50 mg at 05/25/17 1035  . lidocaine (PF) (XYLOCAINE) 1 % injection 5 mL  5 mL Intradermal PRN Alric Seton, PA-C      . lidocaine (PF) (XYLOCAINE) 1 % injection           . lidocaine-prilocaine (EMLA) cream 1 application  1 application Topical PRN  Alric Seton, PA-C      . metoprolol tartrate (LOPRESSOR) tablet 75 mg  75 mg Oral BID Vianne Bulls, MD   75 mg at 05/25/17 1035  . midazolam (VERSED) 2 MG/2ML injection           . morphine 4 MG/ML injection 2-4 mg  2-4 mg Intravenous Q3H PRN Opyd, Ilene Qua, MD   4 mg at 05/25/17 1037  . multivitamin (RENA-VIT) tablet 1 tablet  1 tablet Oral QHS Alric Seton, PA-C      . pantoprazole (PROTONIX) injection 40 mg  40 mg Intravenous Q24H Opyd, Ilene Qua, MD   40 mg at 05/25/17 1036  . pentafluoroprop-tetrafluoroeth (GEBAUERS) aerosol 1 application  1 application Topical PRN Alric Seton, PA-C      . sodium chloride flush (NS) 0.9 % injection 3 mL  3 mL Intravenous Q12H Opyd, Ilene Qua, MD   3 mL at 05/25/17 1039  . sodium chloride flush (NS) 0.9 % injection 3 mL  3 mL Intravenous Q12H Opyd, Ilene Qua, MD   3 mL at 05/25/17 1040  . sodium chloride flush (NS) 0.9 % injection 3 mL  3 mL Intravenous PRN Opyd, Ilene Qua, MD        Facility-Administered Medications Ordered in Other Encounters  Medication Dose Route Frequency Provider Last Rate Last Dose  . methylPREDNISolone acetate (DEPO-MEDROL) injection 40 mg  40 mg Intra-articular Once [name], [name], MD        REVIEW OF SYSTEMS:  [X]  denotes positive finding, [ ]  denotes negative finding Cardiac  Comments:  Chest pain or chest pressure:    Shortness of breath upon exertion:    Short of breath when lying flat:    Irregular heart rhythm:        Vascular    Pain in calf, thigh, or hip brought on by ambulation:    Pain in feet at night that wakes you up from your sleep:     Blood clot in your veins:    Leg swelling:           PHYSICAL EXAM: Vitals:   05/25/17 1630 05/25/17 1700 05/25/17 1730 05/25/17 1800  BP: 120/70 121/63 128/61 (!) 111/57  Pulse: 65 66 71 79  Resp: 16 18 (!) 24 12  Temp:      TempSrc:      SpO2: 100% 100% 100% 100%  Weight:      Height:        GENERAL: The patient is a well-nourished male, in no acute distress. The vital signs are documented above. CARDIOVASCULAR: Palpable radial pulses bilaterally. A visible but relatively small cephalic veins bilaterally. He does have a IV in his cephalic vein at the antecubital space in the right and the midforearm on the left. PULMONARY: There is good air exchange  MUSCULOSKELETAL: There are no major deformities or cyanosis. NEUROLOGIC: No focal weakness or paresthesias are detected. SKIN: There are no ulcers or rashes noted. PSYCHIATRIC: The patient has a normal affect.  DATA:  Vein mapping pending  MEDICAL ISSUES: Discussed need for long-term hemodialysis with patient. Not sure he understood. Will obtain vein mapping of the both arms and follow with you. Will need long-term access prior to discharge    Rosetta Posner, MD [facility] Tel [phone] Pager [phone]

## 2017-05-25 NOTE — Consult Note (Signed)
Chief Complaint: Patient was seen in consultation today for chronic kidney disease  Referring Physician(s):  Dr. Roney Jaffe  History of Present Illness: Thomas Robinson is a 56 y.o. male with past medical history of asthma, HLD, HTN, CVA, and chronic kidney disease who presented to [facility] ED with weakness, falls, nausea, vomiting, and diarrhea.  Patient was found to have BUN of 2925 and Cr of 15.22. Patient to begin dialysis.  He currently does not have access. IR consulted for tunneled dialysis catheter placement at the request of Dr. Jonnie Finner.   Patient has been NPO.  He is not currently on blood thinners.    Past Medical History:  Diagnosis Date  . Asthma   . Chronic kidney disease   . Hyperlipidemia   . Hypertension   . Stroke ([place])   . Symptomatic anemia 05/24/2017    Past Surgical History:  Procedure Laterality Date  . RINOPLASTY      Allergies: Patient has no known allergies.  Medications: Prior to Admission medications   Medication Sig Start Date End Date Taking? Authorizing Provider  albuterol (PROVENTIL HFA;VENTOLIN HFA) 108 (90 BASE) MCG/ACT inhaler Inhale 1-2 puffs into the lungs every 6 (six) hours as needed for wheezing or shortness of breath. 04/12/15  Yes [name], MD  aspirin 81 MG chewable tablet Chew 1 tablet (81 mg total) by mouth daily. 04/12/15  Yes [name], MD  atorvastatin (LIPITOR) 10 MG tablet Take 1 tablet (10 mg total) by mouth daily at 6 PM. 04/12/15  Yes [name], MD  calcitRIOL (ROCALTROL) 0.25 MCG capsule Take 0.25 mcg by mouth See admin instructions. Take 1 capsule (0.25 mcg) by mouth 1st night, then take 2 capsules (0.5 mcg) 2nd night, then repeat   Yes [provider]  DiphenhydrAMINE HCl (ZZZQUIL) 50 MG/30ML LIQD Take 30 mLs by mouth at bedtime as needed (for sleep).   Yes [provider]  furosemide (LASIX) 80 MG tablet Take 1 tablet (80 mg total) by mouth daily. 04/12/15  Yes [name], MD  acetaminophen (TYLENOL) 500 MG tablet Take 1,000 mg by mouth every 4 (four) hours as needed for mild pain or headache. Reported on 12/17/2015    [provider]  amLODipine (NORVASC) 10 MG tablet Take 1 tablet (10 mg total) by mouth daily. 03/07/16   [name], [name], MD  hydrALAZINE (APRESOLINE) 25 MG tablet Take 2 tablets (50 mg total) by mouth 3 (three) times daily. Patient taking differently: Take 50 mg by mouth 2 (two) times daily.  12/17/15   [name], [name], MD  metoprolol (LOPRESSOR) 50 MG tablet Take 1.5 tablets (75 mg total) by mouth 2 (two) times daily. 09/15/16   [name], [name], MD     Family History  Problem Relation Age of Onset  . Cancer Mother     Social History   Social History  . Marital status: Single    Spouse name: N/A  . Number of children: N/A  . Years of education: N/A   Social History Main Topics  . Smoking status: Never Smoker  . Smokeless tobacco: Never Used  . Alcohol use No     Comment: fORMER DRINKER  . Drug use: No  . Sexual activity: Not Asked   Other Topics Concern  . None   Social History Narrative   Lives with a roommate in a one story home.  Has 1 child.  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  Education: high school.    Review of Systems  Constitutional: Negative for fatigue and fever.  Respiratory: Negative for cough and shortness of breath.   Cardiovascular: Negative for chest pain.  Psychiatric/Behavioral: Negative for behavioral problems and confusion.    Vital Signs: BP (!) 157/79   Pulse 73   Temp 98.2 F (36.8 C) (Oral)   Resp 13   Ht 5\' 8"  (1.727 m)   Wt 134 lb (60.8 kg)   SpO2 99%   BMI 20.37 kg/m   Physical Exam  Constitutional: He is oriented to person, place, and time. He appears well-developed.  Cardiovascular: Normal rate, regular rhythm and normal heart sounds.   Pulmonary/Chest: Effort normal and breath sounds normal. No respiratory distress.  Neurological: He is alert and oriented to  person, place, and time.  Skin: Skin is warm and dry.  Psychiatric: He has a normal mood and affect. His behavior is normal. Judgment and thought content normal.  Nursing note and vitals reviewed.   Mallampati Score:  MD Evaluation Airway: WNL Heart: WNL Abdomen: WNL Chest/ Lungs: WNL ASA  Classification: 3 Mallampati/Airway Score: Two  Imaging: No results found.  Labs:  CBC:  Recent Labs  05/24/17 1720 05/25/17 0643  WBC 6.6 7.8  HGB 5.7* 8.0*  HCT 16.9* 24.1*  PLT 247 203    COAGS:  Recent Labs  05/25/17 0859  INR 1.22    BMP:  Recent Labs  05/24/17 1720 05/25/17 0643  NA 131* 134*  K 5.2* 5.5*  CL 97* 101  CO2 <7* 8*  GLUCOSE 119* 88  BUN 225* 222*  CALCIUM 8.9 8.8*  CREATININE 15.72* 15.18*  GFRNONAA 3* 3*  GFRAA 3* 4*    LIVER FUNCTION TESTS:  Recent Labs  05/24/17 1720  BILITOT 0.5  AST 6*  ALT 11*  ALKPHOS 65  PROT 5.9*  ALBUMIN 2.7*    TUMOR MARKERS: No results for input(s): AFPTM, CEA, CA199, CHROMGRNA in the last 8760 hours.  Assessment and Plan: Uremia, chronic kidney disease Patient presents with progressive renal failure; now with uremia and elevated SCr.  IR consulted for tunneled dialysis catheter placement at the request of Dr. Jonnie Finner.  Patient is afebrile.  His INR in 1.22 He has been NPO.  Risks and benefits discussed with the patient including, but not limited to bleeding, infection, vascular injury, pneumothorax which may require chest tube placement, air embolism or even death All of the patient's questions were answered, patient is agreeable to proceed. Consent signed and in chart.  Thank you for this interesting consult.  I greatly enjoyed meeting Jesson Foskey and look forward to participating in their care.  A copy of this report was sent to the requesting provider on this date.  Electronically Signed: Docia Barrier 05/25/2017, 9:57 AM   I spent a total of 40 Minutes    in face to face  in clinical consultation, greater than 50% of which was counseling/coordinating care for chronic renal failure

## 2017-05-25 NOTE — ED Notes (Signed)
Pt taken to the restroom via wheelchair. Pt states he is unable to use the bed pan. Pt to restroom without complication.

## 2017-05-25 NOTE — ED Notes (Signed)
Pt transported to IR with tech and RN.

## 2017-05-26 ENCOUNTER — Encounter (HOSPITAL_COMMUNITY): Payer: Self-pay | Admitting: *Deleted

## 2017-05-26 ENCOUNTER — Inpatient Hospital Stay (HOSPITAL_COMMUNITY): Payer: Medicaid Other

## 2017-05-26 DIAGNOSIS — Z01810 Encounter for preprocedural cardiovascular examination: Secondary | ICD-10-CM

## 2017-05-26 LAB — RENAL FUNCTION PANEL
Albumin: 2.5 g/dL — ABNORMAL LOW (ref 3.5–5.0)
Anion gap: 19 — ABNORMAL HIGH (ref 5–15)
BUN: 113 mg/dL — ABNORMAL HIGH (ref 6–20)
CO2: 17 mmol/L — ABNORMAL LOW (ref 22–32)
Calcium: 8.5 mg/dL — ABNORMAL LOW (ref 8.9–10.3)
Chloride: 99 mmol/L — ABNORMAL LOW (ref 101–111)
Creatinine, Ser: 9.64 mg/dL — ABNORMAL HIGH (ref 0.61–1.24)
GFR calc Af Amer: 6 mL/min — ABNORMAL LOW (ref >60)
GFR calc non Af Amer: 5 mL/min — ABNORMAL LOW (ref >60)
Glucose, Bld: 90 mg/dL (ref 65–99)
Phosphorus: 10.5 mg/dL — ABNORMAL HIGH (ref 2.5–4.6)
Potassium: 3.6 mmol/L (ref 3.5–5.1)
Sodium: 135 mmol/L (ref 135–145)

## 2017-05-26 LAB — HCV COMMENT:

## 2017-05-26 LAB — CBC
HCT: 20.7 % — ABNORMAL LOW (ref 39.0–52.0)
Hemoglobin: 7.2 g/dL — ABNORMAL LOW (ref 13.0–17.0)
MCH: 28.2 pg (ref 26.0–34.0)
MCHC: 34.8 g/dL (ref 30.0–36.0)
MCV: 81.2 fL (ref 78.0–100.0)
Platelets: 164 10*3/uL (ref 150–400)
RBC: 2.55 MIL/uL — ABNORMAL LOW (ref 4.22–5.81)
RDW: 14.6 % (ref 11.5–15.5)
WBC: 5.4 10*3/uL (ref 4.0–10.5)

## 2017-05-26 LAB — HEPATITIS B SURFACE ANTIBODY,QUALITATIVE
HEP B S AB: NONREACTIVE
Hep B S Ab: NONREACTIVE

## 2017-05-26 LAB — GLUCOSE, CAPILLARY
GLUCOSE-CAPILLARY: 73 mg/dL (ref 65–99)
GLUCOSE-CAPILLARY: 94 mg/dL (ref 65–99)
Glucose-Capillary: 70 mg/dL (ref 65–99)
Glucose-Capillary: 83 mg/dL (ref 65–99)

## 2017-05-26 LAB — HEPATITIS C ANTIBODY (REFLEX): HCV Ab: <0.1 s/co ratio (ref 0.0–0.9)

## 2017-05-26 LAB — HEPATITIS B CORE ANTIBODY, IGM: Hep B C IgM: NEGATIVE

## 2017-05-26 LAB — HEPATITIS B CORE ANTIBODY, TOTAL: Hep B Core Total Ab: NEGATIVE

## 2017-05-26 MED ORDER — ONDANSETRON HCL 4 MG/2ML IJ SOLN
INTRAMUSCULAR | Status: AC
  Administered 2017-05-26: 4 mg via INTRAVENOUS
  Filled 2017-05-26: qty 2

## 2017-05-26 MED ORDER — ONDANSETRON HCL 4 MG/2ML IJ SOLN
4.0000 mg | Freq: Four times a day (QID) | INTRAMUSCULAR | Status: DC | PRN
  Administered 2017-05-26: 4 mg via INTRAVENOUS

## 2017-05-26 NOTE — Plan of Care (Signed)
Problem: Nutrition: Goal: Adequate nutrition will be maintained Outcome: Not Progressing Pt vomiting. Reports difficulty eating at home and loss of appetite. Repeats weakness and weight loss at home. Will encourage intake when patient is able to eat.

## 2017-05-26 NOTE — Procedures (Signed)
I was present at this dialysis session. I have reviewed the session itself and made appropriate changes.   Filed Weights   05/25/17 1844 05/25/17 2018 05/26/17 0915  Weight: 56.6 kg (124 lb 12.5 oz) 56.7 kg (125 lb) 53.4 kg (117 lb 11.6 oz)     Recent Labs Lab 05/26/17 0944  NA 135  K 3.6  CL 99*  CO2 17*  GLUCOSE 90  BUN 113*  CREATININE 9.64*  CALCIUM 8.5*  PHOS 10.5*     Recent Labs Lab 05/25/17 0643 05/25/17 1546 05/26/17 0943  WBC 7.8 8.9 5.4  HGB 8.0* 7.5* 7.2*  HCT 24.1* 22.7* 20.7*  MCV 82.8 82.8 81.2  PLT 203 213 164    Scheduled Meds: . amLODipine  10 mg Oral Daily  . atorvastatin  10 mg Oral q1800  . darbepoetin (ARANESP) injection - DIALYSIS  40 mcg Intravenous Q Mon-HD  . hydrALAZINE  50 mg Oral TID  . metoprolol tartrate  75 mg Oral BID  . multivitamin  1 tablet Oral QHS  . pantoprazole (PROTONIX) IV  40 mg Intravenous Q24H  . sodium chloride flush  3 mL Intravenous Q12H  . sodium chloride flush  3 mL Intravenous Q12H   Continuous Infusions: . sodium chloride    . sodium chloride    . sodium chloride     PRN Meds:.sodium chloride, sodium chloride, sodium chloride, acetaminophen **OR** acetaminophen, albuterol, alteplase, diphenhydrAMINE, heparin, heparin, hydrALAZINE, lidocaine (PF), lidocaine-prilocaine, morphine injection, ondansetron (ZOFRAN) IV, pentafluoroprop-tetrafluoroeth, sodium chloride flush    Assessment/Plan:  1. New ESRD with significant uremic symptoms.  On 2nd session of HD today and plan again tomorrow.   2. Anemia of CKD stage 5- s/p blood transfusion, now on ESA 3. SHPTH- hectoral with HD 4. HTN/volume- stable 5. Vascular access- s/p HD cath and awaiting AVF placement 6. Social- no health insurance, consider starting PD once uremia improved to get immediate medicare coverage. 7. Disposition- will need to be set up for outpatient dialysis  Donetta Potts,  MD 05/26/2017, 11:06 AM

## 2017-05-26 NOTE — Progress Notes (Signed)
PROGRESS NOTE  Thomas Robinson  ONG:[id number] DOB: [birth year] DOA: 05/24/2017 PCP: Patient, No Pcp Per  Brief Narrative:   Thomas Robinson is a 56 y.o. male with medical history significant for chronic kidney disease stage V, hypertension, hyperlipidemia, and history of CVA, now presented to the emergency department for evaluation of abdominal pain with nausea, vomiting, loose stools, intermittent melena, anorexia, and fatigue.  He was found to be in renal failure with  potassium 5.2, BUN 225, and creatinine of 15.22.  HD catheter placed by IR on 9/24 and started hemodialysis on 9/24.  Patient has no insurance.    Assessment & Plan:   Principal Problem:   AKI (acute kidney injury) ([place]) Active Problems:   Hypertension, essential   History of completed stroke   CKD (chronic kidney disease), stage V (HCC)   Symptomatic anemia   Uremia   GI bleeding   Metabolic acidosis   Hypertensive urgency   Acute kidney injury superimposed on CKD ([place])  1. New diagnosis ESRD - Presented with mild hyperkalemia, mild hypervolemia, and severe metabolic acidosis  - Nephrology performing daily hemodialysis to address the aforementioned problems -  Vascular surgery consult appreciated -  Follow-up vein mapping  2. Symptomatic anemia, likely due to progressive renal failure/ESRD  - Hgb is 5.7 on admission, down from 11.2 one yr earlier   -  Occult stool negative - Two units of pRBC's transfused on 9/24, H&H increased appropriately -  epo and iron per Nephrology  3. Hypertension with hypertensive urgency, blood pressures improved with dialysis - Continue Norvasc, Lopressor, and hydralazine  - Anticipate improvement with HD   - Continue hydralazine IVP's prn   DVT prophylaxis:  SCDs Code Status:  Full code Family Communication:  Patient alone Disposition Plan:  Home in several days with outpatient HD   Consultants:   Nephrology  IR  Procedures:  Tunneled HD catheter placement on  9/24  Antimicrobials:  Anti-infectives    Start     Dose/Rate Route Frequency Ordered Stop   05/25/17 1100  ceFAZolin (ANCEF) IVPB 2g/100 mL premix     2 g 200 mL/hr over 30 Minutes Intravenous On call 05/25/17 1014 05/25/17 1514       Subjective:  Continues to feel overwhelmed by the process. He has had a lot of nausea and vomiting since yesterday. He denies abdominal pains, chest pains, shortness of breath. He is unsure of the swelling appears any better today.  Objective: Vitals:   05/26/17 1100 05/26/17 1130 05/26/17 1200 05/26/17 1300  BP: (!) 142/66 (!) 157/62 138/71 (!) 155/74  Pulse: 91 94 98 98  Resp: _0 Temp:      TempSrc:      SpO2:    97%  Weight:      Height:        Intake/Output Summary (Last 24 hours) at 05/26/17 1646 Last data filed at 05/26/17 0823  Gross per 24 hour  Intake                6 ml  Output             1001 ml  Net             -995 ml   Filed Weights   05/25/17 1844 05/25/17 2018 05/26/17 0915  Weight: 56.6 kg (124 lb 12.5 oz) 56.7 kg (125 lb) 53.4 kg (117 lb 11.6 oz)    Examination:  General exam:  Adult Male.  No acute distress. Seen in hemodialysis HEENT:  NCAT, MMM, mild perioral pallor Respiratory system: Clear to auscultation bilaterally Cardiovascular system: Regular rate and rhythm, normal S1/S2. No murmurs, rubs, gallops or clicks.  Warm extremities Gastrointestinal system: Normal active bowel sounds, soft, nondistended, nontender. MSK:  Normal tone and bulk, 1+ lower extremity edema mildly improved since yesterday Neuro:  Grossly intact   Data Reviewed: I have personally reviewed following labs and imaging studies  CBC:  Recent Labs Lab 05/24/17 1720 05/25/17 0643 05/25/17 1546 05/26/17 0943  WBC 6.6 7.8 8.9 5.4  HGB 5.7* 8.0* 7.5* 7.2*  HCT 16.9* 24.1* 22.7* 20.7*  MCV 81.3 82.8 82.8 81.2  PLT 247 203 213 832   Basic Metabolic Panel:  Recent Labs Lab 05/24/17 1720 05/25/17 0643 05/25/17 1546  05/26/17 0944  NA 131* 134* 132* 135  K 5.2* 5.5* 5.7* 3.6  CL 97* 101 100* 99*  CO2 <7* 8* <7* 17*  GLUCOSE 119* 88 101* 90  BUN 225* 222* 227* 113*  CREATININE 15.72* 15.18* 15.74* 9.64*  CALCIUM 8.9 8.8* 9.0 8.5*  PHOS  --   --  4.7* 10.5*   GFR: Estimated Creatinine Clearance: 6.3 mL/min (A) (by C-G formula based on SCr of 9.64 mg/dL (H)). Liver Function Tests:  Recent Labs Lab 05/24/17 1720 05/25/17 1546 05/26/17 0944  AST 6*  --   --   ALT 11*  --   --   ALKPHOS 65  --   --   BILITOT 0.5  --   --   PROT 5.9*  --   --   ALBUMIN 2.7* 2.6* 2.5*    Recent Labs Lab 05/24/17 1720  LIPASE 44   No results for input(s): AMMONIA in the last 168 hours. Coagulation Profile:  Recent Labs Lab 05/25/17 0859  INR 1.22   Cardiac Enzymes: No results for input(s): CKTOTAL, CKMB, CKMBINDEX, TROPONINI in the last 168 hours. BNP (last 3 results) No results for input(s): PROBNP in the last 8760 hours. HbA1C: No results for input(s): HGBA1C in the last 72 hours. CBG:  Recent Labs Lab 05/25/17 0806 05/26/17 0023 05/26/17 0120 05/26/17 0435 05/26/17 0807  GLUCAP 81 70 73 83 94   Lipid Profile: No results for input(s): CHOL, HDL, LDLCALC, TRIG, CHOLHDL, LDLDIRECT in the last 72 hours. Thyroid Function Tests: No results for input(s): TSH, T4TOTAL, FREET4, T3FREE, THYROIDAB in the last 72 hours. Anemia Panel:  Recent Labs  05/24/17 1857  VITAMINB12 815  FOLATE 32.0  FERRITIN 109  TIBC 295  IRON 68  RETICCTPCT 4.4*   Urine analysis:    Component Value Date/Time   COLORURINE YELLOW 05/24/2017 1729   APPEARANCEUR HAZY (A) 05/24/2017 1729   LABSPEC 1.011 05/24/2017 1729   PHURINE 5.0 05/24/2017 1729   GLUCOSEU 50 (A) 05/24/2017 1729   HGBUR SMALL (A) 05/24/2017 1729   BILIRUBINUR NEGATIVE 05/24/2017 1729   KETONESUR NEGATIVE 05/24/2017 1729   PROTEINUR 100 (A) 05/24/2017 1729   UROBILINOGEN 0.2 04/09/2015 1527   NITRITE NEGATIVE 05/24/2017 1729    LEUKOCYTESUR TRACE (A) 05/24/2017 1729   Sepsis Labs: _0 (procalcitonin:4,lacticidven:4)  )No results found for this or any previous visit (from the past 240 hour(s)).    Radiology Studies: Ir Fluoro Guide Cv Line Right  Result Date: 05/25/2017 INDICATION: 56 year old male with renal failure EXAM: IMAGE GUIDED CENTRAL VENOUS CATHETER PLACEMENT MEDICATIONS: 2.0 g Ancef; The antibiotic was administered within an appropriate time interval prior to skin puncture. ANESTHESIA/SEDATION: Moderate (conscious) sedation was employed during this procedure.  A total of Versed 1.0 mg and Fentanyl 50 mcg was administered intravenously. Moderate Sedation Time: 15 minutes. The patient's level of consciousness and vital signs were monitored continuously by radiology nursing throughout the procedure under my direct supervision. FLUOROSCOPY TIME:  Fluoroscopy Time: 0 minutes 12 seconds (1.9 mGy). COMPLICATIONS: None PROCEDURE: Informed written consent was obtained from the patient after a discussion of the risks, benefits, and alternatives to treatment. Questions regarding the procedure were encouraged and answered. The right neck and chest were prepped with chlorhexidine in a sterile fashion, and a sterile drape was applied covering the operative field. Maximum barrier sterile technique with sterile gowns and gloves were used for the procedure. A timeout was performed prior to the initiation of the procedure. After creating a small venotomy incision, a micropuncture kit was utilized to access the right internal jugular vein under direct, real-time ultrasound guidance after the overlying soft tissues were anesthetized with 1% lidocaine with epinephrine. Ultrasound image documentation was performed. The microwire was marked to measure appropriate internal catheter length. External tunneled length was estimated. A total tip to cuff length of 19 cm was selected. Skin and subcutaneous tissues of chest wall below the  clavicle were generously infiltrated with 1% lidocaine for local anesthesia. A small stab incision was made with 11 blade scalpel. The selected hemodialysis catheter was tunneled in a retrograde fashion from the anterior chest wall to the venotomy incision. A guidewire was advanced to the level of the IVC and the micropuncture sheath was exchanged for a peel-away sheath. The catheter was then placed through the peel-away sheath with tips ultimately positioned within the superior aspect of the right atrium. Final catheter positioning was confirmed and documented with a spot radiographic image. The catheter aspirates and flushes normally. The catheter was flushed with appropriate volume heparin dwells. The catheter exit site was secured with a 0-Prolene retention suture. The venotomy incision was closed Derma bond and sterile dressing. Dressings were applied at the chest wall. Patient tolerated the procedure well and remained hemodynamically stable throughout. No complications were encountered and no significant blood loss encountered. IMPRESSION: Status post right IJ hemodialysis catheter placement. Catheter ready for use. Signed, Dulcy Fanny. Earleen Newport, DO [facility] Radiology Electronically Signed   By: Corrie Mckusick D.O.   On: 05/25/2017 17:03   Ir US Guide Vasc Access Right  Result Date: 05/25/2017 INDICATION: 56 year old male with renal failure EXAM: IMAGE GUIDED CENTRAL VENOUS CATHETER PLACEMENT MEDICATIONS: 2.0 g Ancef; The antibiotic was administered within an appropriate time interval prior to skin puncture. ANESTHESIA/SEDATION: Moderate (conscious) sedation was employed during this procedure. A total of Versed 1.0 mg and Fentanyl 50 mcg was administered intravenously. Moderate Sedation Time: 15 minutes. The patient's level of consciousness and vital signs were monitored continuously by radiology nursing throughout the procedure under my direct supervision.  FLUOROSCOPY TIME:  Fluoroscopy Time: 0 minutes 12 seconds (1.9 mGy). COMPLICATIONS: None PROCEDURE: Informed written consent was obtained from the patient after a discussion of the risks, benefits, and alternatives to treatment. Questions regarding the procedure were encouraged and answered. The right neck and chest were prepped with chlorhexidine in a sterile fashion, and a sterile drape was applied covering the operative field. Maximum barrier sterile technique with sterile gowns and gloves were used for the procedure. A timeout was performed prior to the initiation of the procedure. After creating a small venotomy incision, a micropuncture kit was utilized to access the right internal jugular vein under direct, real-time ultrasound guidance  after the overlying soft tissues were anesthetized with 1% lidocaine with epinephrine. Ultrasound image documentation was performed. The microwire was marked to measure appropriate internal catheter length. External tunneled length was estimated. A total tip to cuff length of 19 cm was selected. Skin and subcutaneous tissues of chest wall below the clavicle were generously infiltrated with 1% lidocaine for local anesthesia. A small stab incision was made with 11 blade scalpel. The selected hemodialysis catheter was tunneled in a retrograde fashion from the anterior chest wall to the venotomy incision. A guidewire was advanced to the level of the IVC and the micropuncture sheath was exchanged for a peel-away sheath. The catheter was then placed through the peel-away sheath with tips ultimately positioned within the superior aspect of the right atrium. Final catheter positioning was confirmed and documented with a spot radiographic image. The catheter aspirates and flushes normally. The catheter was flushed with appropriate volume heparin dwells. The catheter exit site was secured with a 0-Prolene retention suture. The venotomy incision was closed Derma bond and sterile  dressing. Dressings were applied at the chest wall. Patient tolerated the procedure well and remained hemodynamically stable throughout. No complications were encountered and no significant blood loss encountered. IMPRESSION: Status post right IJ hemodialysis catheter placement. Catheter ready for use. Signed, Dulcy Fanny. Earleen Newport, DO [facility] Radiology Electronically Signed   By: Corrie Mckusick D.O.   On: 05/25/2017 17:03     Scheduled Meds: . amLODipine  10 mg Oral Daily  . atorvastatin  10 mg Oral q1800  . darbepoetin (ARANESP) injection - DIALYSIS  40 mcg Intravenous Q Mon-HD  . hydrALAZINE  50 mg Oral TID  . metoprolol tartrate  75 mg Oral BID  . multivitamin  1 tablet Oral QHS  . pantoprazole (PROTONIX) IV  40 mg Intravenous Q24H  . sodium chloride flush  3 mL Intravenous Q12H  . sodium chloride flush  3 mL Intravenous Q12H   Continuous Infusions: . sodium chloride       LOS: 2 days    Time spent: 30 min    Janece Canterbury, MD [facility] Pager [phone]  If 7PM-7AM, please contact night-coverage [email or web address] Password [facility] 05/26/2017, 4:46 PM

## 2017-05-26 NOTE — Progress Notes (Signed)
Upper Extremity Vein Map    Right Cephalic  Segment Diameter Depth Comment  1. Axilla 3.39mm mm   2. Mid upper arm 4.32mm mm   3. Above AC 4.14mm mm branch  4. In AC 4.74mm mm   5. Below AC 4.36mm mm   6. Mid forearm 3.13mm mm   7. Wrist mm mm    mm mm    mm mm    mm mm     Left Cephalic  Segment Diameter Depth Comment  1. Axilla 4.50mm mm   2. Mid upper arm 4.36mm mm   3. Above AC 4.86mm mm   4. In [place] 4.30mm mm   5. Below AC 4.17mm mm   6. Mid forearm 4.26mm mm branch  7. Wrist 4.55mm mm    mm mm    mm mm    mm mm    Landry Mellow, RDMS, RVT 05/26/2017

## 2017-05-27 LAB — CBC
HCT: 20.1 % — ABNORMAL LOW (ref 39.0–52.0)
Hemoglobin: 6.8 g/dL — CL (ref 13.0–17.0)
MCH: 27.9 pg (ref 26.0–34.0)
MCHC: 33.8 g/dL (ref 30.0–36.0)
MCV: 82.4 fL (ref 78.0–100.0)
PLATELETS: 155 10*3/uL (ref 150–400)
RBC: 2.44 MIL/uL — ABNORMAL LOW (ref 4.22–5.81)
RDW: 14.3 % (ref 11.5–15.5)
WBC: 4.8 10*3/uL (ref 4.0–10.5)

## 2017-05-27 LAB — RENAL FUNCTION PANEL
Albumin: 2.3 g/dL — ABNORMAL LOW (ref 3.5–5.0)
Anion gap: 9 (ref 5–15)
BUN: 52 mg/dL — AB (ref 6–20)
CO2: 26 mmol/L (ref 22–32)
CREATININE: 6.22 mg/dL — AB (ref 0.61–1.24)
Calcium: 8.6 mg/dL — ABNORMAL LOW (ref 8.9–10.3)
Chloride: 95 mmol/L — ABNORMAL LOW (ref 101–111)
GFR, EST AFRICAN AMERICAN: 10 mL/min — AB (ref >60)
GFR, EST NON AFRICAN AMERICAN: 9 mL/min — AB (ref >60)
Glucose, Bld: 134 mg/dL — ABNORMAL HIGH (ref 65–99)
PHOSPHORUS: 5.8 mg/dL — AB (ref 2.5–4.6)
Potassium: 3 mmol/L — ABNORMAL LOW (ref 3.5–5.1)
Sodium: 130 mmol/L — ABNORMAL LOW (ref 135–145)

## 2017-05-27 LAB — PREPARE RBC (CROSSMATCH)

## 2017-05-27 LAB — GLUCOSE, CAPILLARY: Glucose-Capillary: 112 mg/dL — ABNORMAL HIGH (ref 65–99)

## 2017-05-27 MED ORDER — HEPARIN SODIUM (PORCINE) 1000 UNIT/ML DIALYSIS: 20.0000 [IU]/kg | INTRAMUSCULAR | Status: DC | PRN

## 2017-05-27 MED ORDER — ZOLPIDEM TARTRATE 5 MG PO TABS
5.0000 mg | ORAL_TABLET | Freq: Every evening | ORAL | Status: DC | PRN
  Administered 2017-05-27: 5 mg via ORAL
  Filled 2017-05-27 (×2): qty 1

## 2017-05-27 MED ORDER — SODIUM CHLORIDE 0.9 % IV SOLN
125.0000 mg | INTRAVENOUS | Status: DC
  Filled 2017-05-27: qty 10

## 2017-05-27 MED ORDER — SODIUM CHLORIDE 0.9 % IV SOLN: Freq: Once | INTRAVENOUS | Status: DC

## 2017-05-27 MED ORDER — DEXTROSE 5 % IV SOLN
1.5000 g | INTRAVENOUS | Status: AC
  Administered 2017-05-28: 1.5 g via INTRAVENOUS
  Filled 2017-05-27 (×2): qty 1.5

## 2017-05-27 MED ORDER — NEPRO/CARBSTEADY PO LIQD
237.0000 mL | Freq: Two times a day (BID) | ORAL | Status: DC
  Administered 2017-05-28 – 2017-05-31 (×5): 237 mL via ORAL
  Filled 2017-05-27 (×16): qty 237

## 2017-05-27 MED ORDER — SUCROFERRIC OXYHYDROXIDE 500 MG PO CHEW
500.0000 mg | CHEWABLE_TABLET | Freq: Three times a day (TID) | ORAL | Status: DC
  Administered 2017-05-27 – 2017-06-01 (×11): 500 mg via ORAL
  Filled 2017-05-27 (×21): qty 1

## 2017-05-27 NOTE — Progress Notes (Addendum)
Spoke w Sue Lush, re medicaid pending number, she referred to financial counselors, left message w Theodis Aguas, awaiting callback.  16:20 Nicole Kindred provided me with number for Vita Barley in [employer] who is the in house medicaid worker [phone]) who is working w DSS on application. Hopefully tomorrow application can be keyed into system and pending medicaid ID will available thereafter.

## 2017-05-27 NOTE — Progress Notes (Signed)
PROGRESS NOTE    Thomas Robinson  PTW:[id number] DOB: Jun 19, 1961 DOA: 05/24/2017 PCP: Patient, No Pcp Per   Brief Narrative:  Thomas Robinson a 56 y.o.malewith medical history significant for chronic kidney disease stage V, hypertension, hyperlipidemia, and history of CVA, now presented to the emergency department for evaluation of abdominal pain with nausea, vomiting, loose stools, intermittent melena, anorexia, and fatigue.  He was found to be in renal failure with  potassium 5.2, BUN 225, and creatinine of 15.22.  He had a tunnel dialysis catheter placed, and has gotten dialysis.  He is planned to have AVF done tomorrow.  He is still slightly uremic clinically.  He is also depressed.    Assessment & Plan:   Principal Problem:   AKI (acute kidney injury) ([place]) Active Problems:   Hypertension, essential   History of completed stroke   CKD (chronic kidney disease), stage V (HCC)   Symptomatic anemia   Uremia   GI bleeding   Metabolic acidosis   Hypertensive urgency   Acute kidney injury superimposed on CKD ([place])   1. ESRD :  Continue with dialysis thru tunnel catheter.  AV fistula per vascular planned for tomorrow.   He is still nauseated, no appetite, lethargic... Slightly uremic clinically still.    2. Symptomatic anemia, likely due to progressive renal failure/ESRD  - Hgb is 5.7 on admission, down from 11.2 one yr earlier  -  Occult stool negative - Two units of pRBC's transfused, H&H increased appropriately -  epo and iron per Nephrology  3. Hypertension with hypertensive urgency - Continue Norvasc, Lopressor, and hydralazine  - Anticipate improvement with HD  - Continue hydralazine IVP's prn   DVT prophylaxis: SCD.  Code Status: FULL CODE.  Family Communication: friend at bedside.  Disposition Plan: Home when appropriate.   Consultants:   Vascular and nephrology.   Procedures:   Tunnel dialysis catheter.  AVF to be placed.    Antimicrobials: Anti-infectives    Start     Dose/Rate Route Frequency Ordered Stop   05/28/17 0600  cefUROXime (ZINACEF) 1.5 g in dextrose 5 % 50 mL IVPB     1.5 g 100 mL/hr over 30 Minutes Intravenous On call to O.R. 05/27/17 1152 05/29/17 0559   05/25/17 1100  ceFAZolin (ANCEF) IVPB 2g/100 mL premix     2 g 200 mL/hr over 30 Minutes Intravenous On call 05/25/17 1014 05/25/17 1514       Subjective:  "I am not feeling well.  I feel weak"   Objective: Vitals:   05/27/17 1456 05/27/17 1500 05/27/17 1509 05/27/17 1623  BP: 122/68 122/71 120/69 139/68  Pulse: 75 75 75 79  Resp: 18  18   Temp: 98.2 F (36.8 C)  98.2 F (36.8 C) 98.4 F (36.9 C)  TempSrc: Oral  Oral Axillary  SpO2:    96%  Weight:      Height:        Intake/Output Summary (Last 24 hours) at 05/27/17 1744 Last data filed at 05/27/17 1650  Gross per 24 hour  Intake              675 ml  Output              500 ml  Net              175 ml   Filed Weights   05/26/17 1230 05/26/17 2132 05/27/17 1200  Weight: 52 kg (114 lb 10.2 oz) 52.3 kg (115 lb 4.8  oz) 53.9 kg (118 lb 13.3 oz)    Examination:  General exam: Appears calm and comfortable  Respiratory system: Clear to auscultation. Respiratory effort normal. Cardiovascular system: S1 & S2 heard, RRR. No JVD, murmurs, rubs, gallops or clicks. No pedal edema. Gastrointestinal system: Abdomen is nondistended, soft and nontender. No organomegaly or masses felt. Normal bowel sounds heard. Central nervous system: Alert and oriented. No focal neurological deficits. Extremities: Symmetric 5 x 5 power. Skin: No rashes, lesions or ulcers Psychiatry: Judgement and insight appear normal. Mood & affect appropriate.   Data Reviewed: I have personally reviewed following labs and imaging studies  CBC:  Recent Labs Lab 05/24/17 1720 05/25/17 0643 05/25/17 1546 05/26/17 0943 05/27/17 1224  WBC 6.6 7.8 8.9 5.4 4.8  HGB 5.7* 8.0* 7.5* 7.2* 6.8*  HCT 16.9* 24.1*  22.7* 20.7* 20.1*  MCV 81.3 82.8 82.8 81.2 82.4  PLT 247 203 213 164 250   Basic Metabolic Panel:  Recent Labs Lab 05/24/17 1720 05/25/17 0643 05/25/17 1546 05/26/17 0944 05/27/17 1223  NA 131* 134* 132* 135 130*  K 5.2* 5.5* 5.7* 3.6 3.0*  CL 97* 101 100* 99* 95*  CO2 <7* 8* <7* 17* 26  GLUCOSE 119* 88 101* 90 134*  BUN 225* 222* 227* 113* 52*  CREATININE 15.72* 15.18* 15.74* 9.64* 6.22*  CALCIUM 8.9 8.8* 9.0 8.5* 8.6*  PHOS  --   --  4.7* 10.5* 5.8*   GFR: Estimated Creatinine Clearance: 9.9 mL/min (A) (by C-G formula based on SCr of 6.22 mg/dL (H)). Liver Function Tests:  Recent Labs Lab 05/24/17 1720 05/25/17 1546 05/26/17 0944 05/27/17 1223  AST 6*  --   --   --   ALT 11*  --   --   --   ALKPHOS 65  --   --   --   BILITOT 0.5  --   --   --   PROT 5.9*  --   --   --   ALBUMIN 2.7* 2.6* 2.5* 2.3*    Recent Labs Lab 05/24/17 1720  LIPASE 44   No results for input(s): AMMONIA in the last 168 hours. Coagulation Profile:  Recent Labs Lab 05/25/17 0859  INR 1.22   CBG:  Recent Labs Lab 05/26/17 0023 05/26/17 0120 05/26/17 0435 05/26/17 0807 05/27/17 0743  GLUCAP 70 73 83 94 112*   Anemia Panel:  Recent Labs  05/24/17 1857  VITAMINB12 815  FOLATE 32.0  FERRITIN 109  TIBC 295  IRON 68  RETICCTPCT 4.4*    Radiology Studies: No results found.  Scheduled Meds: . amLODipine  10 mg Oral Daily  . atorvastatin  10 mg Oral q1800  . darbepoetin (ARANESP) injection - DIALYSIS  40 mcg Intravenous Q Mon-HD  . feeding supplement (NEPRO CARB STEADY)  237 mL Oral BID BM  . hydrALAZINE  50 mg Oral TID  . metoprolol tartrate  75 mg Oral BID  . multivitamin  1 tablet Oral QHS  . pantoprazole (PROTONIX) IV  40 mg Intravenous Q24H  . sodium chloride flush  3 mL Intravenous Q12H  . sodium chloride flush  3 mL Intravenous Q12H  . sucroferric oxyhydroxide  500 mg Oral TID WC   Continuous Infusions: . sodium chloride    . sodium chloride    .  [START ON 05/28/2017] cefUROXime (ZINACEF)  IV    . ferric gluconate (FERRLECIT/NULECIT) IV       LOS: 3 days   Aliza Moret, MD FACP Hospitalist.   If 7PM-7AM, please  contact night-coverage [email or web address] Password [facility] 05/27/2017, 5:44 PM

## 2017-05-27 NOTE — Procedures (Signed)
I was present at this dialysis session. I have reviewed the session itself and made appropriate changes.   Filed Weights   05/26/17 1230 05/26/17 2132 05/27/17 1200  Weight: 52 kg (114 lb 10.2 oz) 52.3 kg (115 lb 4.8 oz) 53.9 kg (118 lb 13.3 oz)     Recent Labs Lab 05/27/17 1223  NA 130*  K 3.0*  CL 95*  CO2 26  GLUCOSE 134*  BUN 52*  CREATININE 6.22*  CALCIUM 8.6*  PHOS 5.8*     Recent Labs Lab 05/25/17 1546 05/26/17 0943 05/27/17 1224  WBC 8.9 5.4 4.8  HGB 7.5* 7.2* 6.8*  HCT 22.7* 20.7* 20.1*  MCV 82.8 81.2 82.4  PLT 213 164 155    Scheduled Meds: . amLODipine  10 mg Oral Daily  . atorvastatin  10 mg Oral q1800  . darbepoetin (ARANESP) injection - DIALYSIS  40 mcg Intravenous Q Mon-HD  . feeding supplement (NEPRO CARB STEADY)  237 mL Oral BID BM  . hydrALAZINE  50 mg Oral TID  . metoprolol tartrate  75 mg Oral BID  . multivitamin  1 tablet Oral QHS  . pantoprazole (PROTONIX) IV  40 mg Intravenous Q24H  . sodium chloride flush  3 mL Intravenous Q12H  . sodium chloride flush  3 mL Intravenous Q12H  . sucroferric oxyhydroxide  500 mg Oral TID WC   Continuous Infusions: . sodium chloride    . sodium chloride    . [START ON 05/28/2017] cefUROXime (ZINACEF)  IV    . ferric gluconate (FERRLECIT/NULECIT) IV     PRN Meds:.sodium chloride, acetaminophen **OR** acetaminophen, albuterol, diphenhydrAMINE, heparin, hydrALAZINE, morphine injection, ondansetron (ZOFRAN) IV, sodium chloride flush   Donetta Potts,  MD 05/27/2017, 3:11 PM

## 2017-05-27 NOTE — Progress Notes (Signed)
[facility] Progress Note   Dialysis Orders: NEW ESRD CLIP pending   Assessment/Plan: 1. New ESRD with significant uremic symptoms.  For  3rd session of HD today HIV Neg He B and C neg. Discussed with nursing educational videos he needs to watch  2. Anemia of CKD stage 5- s/p 2 units PRBC, now on ESA hgb 5.3 > 7.2 - ESA started 23% sat ferritin 109 folate- iron studies drawn before transfusion- will give partial course of Fe 3. SHPTH- hectoral with HD- need ipTH   P 10.5 - start binder here but will need to figure out affordable option after discharge  4. HTN/volume- stable, on MTP 75 bid and norvasc 10 5. Vascular access; pt is right handed s/p HD cath and awaiting AVF placement 6. Social- no health insurance, consider starting PD once uremia improved to get immediate medicare coverage. 7. PCM - weight loss of ~40# over time - used to weigh 160# alb - low- starting to eat today- add nepro  8. Disposition- will need to be set up for outpatient dialysis  Myriam Jacobson, PA-C [place] [phone] 05/27/2017,9:42 AM  LOS: 3 days   Subjective:   Recalls Dr. Marval Regal discussing PD but not sure he is interested in this.  Objective Vitals:   05/26/17 1700 05/26/17 2132 05/27/17 0415 05/27/17 0809  BP: (!) 149/119 (!) 141/57 131/70 (!) 156/77  Pulse: 86 83 76 80  Resp: _0 Temp: 98.3 F (36.8 C) 98.6 F (37 C) 98.5 F (36.9 C) 98.3 F (36.8 C)  TempSrc: Oral Oral Oral Oral  SpO2: 95% 95% 93% 96%  Weight:  52.3 kg (115 lb 4.8 oz)    Height:       Physical Exam General: thin ill appearing Heart: RRR Lungs: no rales Abdomen: soft NT Extremities: no edema Dialysis Access:  Right IJ [facility]   Additional Objective Labs: Basic Metabolic Panel:  Recent Labs Lab 05/25/17 0643 05/25/17 1546 05/26/17 0944  NA 134* 132* 135  K 5.5* 5.7* 3.6  CL 101 100* 99*  CO2 8* <7* 17*  GLUCOSE 88 101* 90  BUN 222* 227* 113*  CREATININE  15.18* 15.74* 9.64*  CALCIUM 8.8* 9.0 8.5*  PHOS  --  4.7* 10.5*   Liver Function Tests:  Recent Labs Lab 05/24/17 1720 05/25/17 1546 05/26/17 0944  AST 6*  --   --   ALT 11*  --   --   ALKPHOS 65  --   --   BILITOT 0.5  --   --   PROT 5.9*  --   --   ALBUMIN 2.7* 2.6* 2.5*    Recent Labs Lab 05/24/17 1720  LIPASE 44   CBC:  Recent Labs Lab 05/24/17 1720 05/25/17 0643 05/25/17 1546 05/26/17 0943  WBC 6.6 7.8 8.9 5.4  HGB 5.7* 8.0* 7.5* 7.2*  HCT 16.9* 24.1* 22.7* 20.7*  MCV 81.3 82.8 82.8 81.2  PLT 247 203 213 164   CBG:  Recent Labs Lab 05/26/17 0023 05/26/17 0120 05/26/17 0435 05/26/17 0807 05/27/17 0743  GLUCAP 70 73 83 94 112*   Iron Studies:  Recent Labs  05/24/17 1857  IRON 68  TIBC 295  FERRITIN 109   Lab Results  Component Value Date   INR 1.22 05/25/2017   INR 1.16 04/10/2015   INR 1.08 04/09/2015   Studies/Results: Ir Fluoro Guide Cv Line Right  Result Date: 05/25/2017 INDICATION: 56 year old male with renal failure EXAM: IMAGE GUIDED  CENTRAL VENOUS CATHETER PLACEMENT MEDICATIONS: 2.0 g Ancef; The antibiotic was administered within an appropriate time interval prior to skin puncture. ANESTHESIA/SEDATION: Moderate (conscious) sedation was employed during this procedure. A total of Versed 1.0 mg and Fentanyl 50 mcg was administered intravenously. Moderate Sedation Time: 15 minutes. The patient's level of consciousness and vital signs were monitored continuously by radiology nursing throughout the procedure under my direct supervision. FLUOROSCOPY TIME:  Fluoroscopy Time: 0 minutes 12 seconds (1.9 mGy). COMPLICATIONS: None PROCEDURE: Informed written consent was obtained from the patient after a discussion of the risks, benefits, and alternatives to treatment. Questions regarding the procedure were encouraged and answered. The right neck and chest were prepped with chlorhexidine in a sterile fashion, and a sterile drape was applied covering the  operative field. Maximum barrier sterile technique with sterile gowns and gloves were used for the procedure. A timeout was performed prior to the initiation of the procedure. After creating a small venotomy incision, a micropuncture kit was utilized to access the right internal jugular vein under direct, real-time ultrasound guidance after the overlying soft tissues were anesthetized with 1% lidocaine with epinephrine. Ultrasound image documentation was performed. The microwire was marked to measure appropriate internal catheter length. External tunneled length was estimated. A total tip to cuff length of 19 cm was selected. Skin and subcutaneous tissues of chest wall below the clavicle were generously infiltrated with 1% lidocaine for local anesthesia. A small stab incision was made with 11 blade scalpel. The selected hemodialysis catheter was tunneled in a retrograde fashion from the anterior chest wall to the venotomy incision. A guidewire was advanced to the level of the IVC and the micropuncture sheath was exchanged for a peel-away sheath. The catheter was then placed through the peel-away sheath with tips ultimately positioned within the superior aspect of the right atrium. Final catheter positioning was confirmed and documented with a spot radiographic image. The catheter aspirates and flushes normally. The catheter was flushed with appropriate volume heparin dwells. The catheter exit site was secured with a 0-Prolene retention suture. The venotomy incision was closed Derma bond and sterile dressing. Dressings were applied at the chest wall. Patient tolerated the procedure well and remained hemodynamically stable throughout. No complications were encountered and no significant blood loss encountered. IMPRESSION: Status post right IJ hemodialysis catheter placement. Catheter ready for use. Signed, Dulcy Fanny. Earleen Newport, DO [facility] Electronically Signed    By: Corrie Mckusick D.O.   On: 05/25/2017 17:03   Ir US Guide Vasc Access Right  Result Date: 05/25/2017 INDICATION: 56 year old male with renal failure EXAM: IMAGE GUIDED CENTRAL VENOUS CATHETER PLACEMENT MEDICATIONS: 2.0 g Ancef; The antibiotic was administered within an appropriate time interval prior to skin puncture. ANESTHESIA/SEDATION: Moderate (conscious) sedation was employed during this procedure. A total of Versed 1.0 mg and Fentanyl 50 mcg was administered intravenously. Moderate Sedation Time: 15 minutes. The patient's level of consciousness and vital signs were monitored continuously by radiology nursing throughout the procedure under my direct supervision. FLUOROSCOPY TIME:  Fluoroscopy Time: 0 minutes 12 seconds (1.9 mGy). COMPLICATIONS: None PROCEDURE: Informed written consent was obtained from the patient after a discussion of the risks, benefits, and alternatives to treatment. Questions regarding the procedure were encouraged and answered. The right neck and chest were prepped with chlorhexidine in a sterile fashion, and a sterile drape was applied covering the operative field. Maximum barrier sterile technique with sterile gowns and gloves were used for the procedure. A timeout was performed  prior to the initiation of the procedure. After creating a small venotomy incision, a micropuncture kit was utilized to access the right internal jugular vein under direct, real-time ultrasound guidance after the overlying soft tissues were anesthetized with 1% lidocaine with epinephrine. Ultrasound image documentation was performed. The microwire was marked to measure appropriate internal catheter length. External tunneled length was estimated. A total tip to cuff length of 19 cm was selected. Skin and subcutaneous tissues of chest wall below the clavicle were generously infiltrated with 1% lidocaine for local anesthesia. A small stab incision was made with 11 blade scalpel. The selected hemodialysis  catheter was tunneled in a retrograde fashion from the anterior chest wall to the venotomy incision. A guidewire was advanced to the level of the IVC and the micropuncture sheath was exchanged for a peel-away sheath. The catheter was then placed through the peel-away sheath with tips ultimately positioned within the superior aspect of the right atrium. Final catheter positioning was confirmed and documented with a spot radiographic image. The catheter aspirates and flushes normally. The catheter was flushed with appropriate volume heparin dwells. The catheter exit site was secured with a 0-Prolene retention suture. The venotomy incision was closed Derma bond and sterile dressing. Dressings were applied at the chest wall. Patient tolerated the procedure well and remained hemodynamically stable throughout. No complications were encountered and no significant blood loss encountered. IMPRESSION: Status post right IJ hemodialysis catheter placement. Catheter ready for use. Signed, Dulcy Fanny. Earleen Newport, DO [facility] Electronically Signed   By: Corrie Mckusick D.O.   On: 05/25/2017 17:03   Medications: . sodium chloride     . amLODipine  10 mg Oral Daily  . atorvastatin  10 mg Oral q1800  . darbepoetin (ARANESP) injection - DIALYSIS  40 mcg Intravenous Q Mon-HD  . hydrALAZINE  50 mg Oral TID  . metoprolol tartrate  75 mg Oral BID  . multivitamin  1 tablet Oral QHS  . pantoprazole (PROTONIX) IV  40 mg Intravenous Q24H  . sodium chloride flush  3 mL Intravenous Q12H  . sodium chloride flush  3 mL Intravenous Q12H   I have seen and examined this patient and agree with plan and assessment in the above note with renal recommendations/intervention highlighted.  He will need to have access placed prior to discharge. Broadus John A Rivaan Kendall,MD 05/27/2017 3:10 PM

## 2017-05-27 NOTE — Progress Notes (Signed)
Returned from HD in NAD, pt sitting up eaiting late lunch, vitals WNL.

## 2017-05-27 NOTE — Progress Notes (Signed)
Patient ID: Thomas Robinson, male   DOB: 12-19-1960, 56 y.o.   MRN: [id number] The patient is seen today on dialysis. Is having good use of his hemodialysis catheter. Discussed the plan for a left arm AV fistula tomorrow. He has very nice cephalic vein at the antecubital space. He has several branches in the forearm. Explained that decision would be made at time of surgery whether to place a left radiocephalic are left brachiocephalic fistula. He stands and does ready for surgery tomorrow

## 2017-05-28 ENCOUNTER — Encounter (HOSPITAL_COMMUNITY): Payer: Self-pay | Admitting: Certified Registered Nurse Anesthetist

## 2017-05-28 ENCOUNTER — Inpatient Hospital Stay (HOSPITAL_COMMUNITY): Payer: Medicaid Other | Admitting: Certified Registered Nurse Anesthetist

## 2017-05-28 ENCOUNTER — Encounter (HOSPITAL_COMMUNITY)
Admission: EM | Disposition: A | Discharge status: Home / Self Care | Payer: Self-pay | Source: Home / Self Care | Attending: Nephrology

## 2017-05-28 ENCOUNTER — Telehealth: Payer: Self-pay | Admitting: Vascular Surgery

## 2017-05-28 DIAGNOSIS — N185 Chronic kidney disease, stage 5: Secondary | ICD-10-CM

## 2017-05-28 HISTORY — PX: AV FISTULA PLACEMENT: SHX1204

## 2017-05-28 LAB — SURGICAL PCR SCREEN
MRSA, PCR: NEGATIVE
Staphylococcus aureus: POSITIVE — AB

## 2017-05-28 LAB — TYPE AND SCREEN
ABO/RH(D): A POS
ABO/RH(D): A POS
Antibody Screen: NEGATIVE
Antibody Screen: NEGATIVE
Unit division: 0
Unit division: 0
Unit division: 0

## 2017-05-28 LAB — BPAM RBC
BLOOD PRODUCT EXPIRATION DATE: 201810092359
Blood Product Expiration Date: 201810032359
Blood Product Expiration Date: 201810092359
ISSUE DATE / TIME: 201809232031
ISSUE DATE / TIME: 201809240046
ISSUE DATE / TIME: 201809261423
UNIT TYPE AND RH: 6200
UNIT TYPE AND RH: 6200
Unit Type and Rh: 6200

## 2017-05-28 LAB — CBC
HEMATOCRIT: 22 % — AB (ref 39.0–52.0)
HEMOGLOBIN: 7.6 g/dL — AB (ref 13.0–17.0)
MCH: 29.2 pg (ref 26.0–34.0)
MCHC: 34.5 g/dL (ref 30.0–36.0)
MCV: 84.6 fL (ref 78.0–100.0)
Platelets: 148 10*3/uL — ABNORMAL LOW (ref 150–400)
RBC: 2.6 MIL/uL — ABNORMAL LOW (ref 4.22–5.81)
RDW: 14.2 % (ref 11.5–15.5)
WBC: 5.2 10*3/uL (ref 4.0–10.5)

## 2017-05-28 LAB — BASIC METABOLIC PANEL
ANION GAP: 10 (ref 5–15)
BUN: 27 mg/dL — ABNORMAL HIGH (ref 6–20)
CO2: 25 mmol/L (ref 22–32)
Calcium: 8.3 mg/dL — ABNORMAL LOW (ref 8.9–10.3)
Chloride: 100 mmol/L — ABNORMAL LOW (ref 101–111)
Creatinine, Ser: 3.96 mg/dL — ABNORMAL HIGH (ref 0.61–1.24)
GFR calc Af Amer: 18 mL/min — ABNORMAL LOW (ref >60)
GFR, EST NON AFRICAN AMERICAN: 15 mL/min — AB (ref >60)
GLUCOSE: 127 mg/dL — AB (ref 65–99)
POTASSIUM: 3.2 mmol/L — AB (ref 3.5–5.1)
Sodium: 135 mmol/L (ref 135–145)

## 2017-05-28 LAB — PROTIME-INR
INR: 1.06
Prothrombin Time: 13.7 seconds (ref 11.4–15.2)

## 2017-05-28 LAB — GLUCOSE, CAPILLARY: Glucose-Capillary: 126 mg/dL — ABNORMAL HIGH (ref 65–99)

## 2017-05-28 SURGERY — ARTERIOVENOUS (AV) FISTULA CREATION
Anesthesia: Monitor Anesthesia Care | Site: Arm Lower | Laterality: Left

## 2017-05-28 MED ORDER — 0.9 % SODIUM CHLORIDE (POUR BTL) OPTIME
TOPICAL | Status: DC | PRN
  Administered 2017-05-28: 1000 mL

## 2017-05-28 MED ORDER — PROPOFOL 10 MG/ML IV BOLUS
INTRAVENOUS | Status: AC
  Filled 2017-05-28: qty 20

## 2017-05-28 MED ORDER — PROPOFOL 10 MG/ML IV BOLUS
INTRAVENOUS | Status: DC | PRN
  Administered 2017-05-28: 25 mg via INTRAVENOUS
  Administered 2017-05-28: 15 mg via INTRAVENOUS

## 2017-05-28 MED ORDER — PROPOFOL 500 MG/50ML IV EMUL
INTRAVENOUS | Status: DC | PRN
  Administered 2017-05-28: 100 ug/kg/min via INTRAVENOUS

## 2017-05-28 MED ORDER — SODIUM CHLORIDE 0.9 % IV SOLN
INTRAVENOUS | Status: DC | PRN
  Administered 2017-05-28: 500 mL

## 2017-05-28 MED ORDER — MIDAZOLAM HCL 5 MG/5ML IJ SOLN
INTRAMUSCULAR | Status: DC | PRN
  Administered 2017-05-28: 2 mg via INTRAVENOUS

## 2017-05-28 MED ORDER — FENTANYL CITRATE (PF) 250 MCG/5ML IJ SOLN
INTRAMUSCULAR | Status: AC
  Filled 2017-05-28: qty 5

## 2017-05-28 MED ORDER — OXYCODONE-ACETAMINOPHEN 5-325 MG PO TABS
1.0000 | ORAL_TABLET | Freq: Four times a day (QID) | ORAL | Status: DC | PRN
  Administered 2017-05-29 – 2017-05-31 (×3): 1 via ORAL
  Filled 2017-05-28 (×2): qty 1

## 2017-05-28 MED ORDER — MUPIROCIN 2 % EX OINT
TOPICAL_OINTMENT | CUTANEOUS | Status: AC
  Filled 2017-05-28: qty 22

## 2017-05-28 MED ORDER — MIDAZOLAM HCL 2 MG/2ML IJ SOLN
INTRAMUSCULAR | Status: AC
  Filled 2017-05-28: qty 2

## 2017-05-28 MED ORDER — MUPIROCIN 2 % EX OINT
1.0000 "application " | TOPICAL_OINTMENT | Freq: Two times a day (BID) | CUTANEOUS | Status: DC
  Administered 2017-05-28 – 2017-06-01 (×8): 1 via NASAL
  Filled 2017-05-28 (×3): qty 22

## 2017-05-28 MED ORDER — LIDOCAINE HCL (CARDIAC) 20 MG/ML IV SOLN
INTRAVENOUS | Status: DC | PRN
  Administered 2017-05-28: 50 mg via INTRATRACHEAL

## 2017-05-28 MED ORDER — CEFUROXIME SODIUM 1.5 G IV SOLR
1.5000 g | INTRAVENOUS | Status: AC
  Filled 2017-05-28: qty 1.5

## 2017-05-28 MED ORDER — LIDOCAINE HCL (PF) 1 % IJ SOLN
INTRAMUSCULAR | Status: DC | PRN
  Administered 2017-05-28: 4 mL

## 2017-05-28 MED ORDER — LIDOCAINE HCL (PF) 1 % IJ SOLN
INTRAMUSCULAR | Status: AC
  Filled 2017-05-28: qty 30

## 2017-05-28 MED ORDER — CHLORHEXIDINE GLUCONATE CLOTH 2 % EX PADS
6.0000 | MEDICATED_PAD | Freq: Every day | CUTANEOUS | Status: DC
  Administered 2017-05-29 – 2017-05-31 (×3): 6 via TOPICAL

## 2017-05-28 MED ORDER — FENTANYL CITRATE (PF) 100 MCG/2ML IJ SOLN: 25.0000 ug | INTRAMUSCULAR | Status: DC | PRN

## 2017-05-28 MED ORDER — SODIUM CHLORIDE 0.9 % IV SOLN
INTRAVENOUS | Status: DC | PRN
  Administered 2017-05-28: 07:00:00 via INTRAVENOUS

## 2017-05-28 MED ORDER — ONDANSETRON HCL 4 MG/2ML IJ SOLN
INTRAMUSCULAR | Status: DC | PRN
  Administered 2017-05-28: 4 mg via INTRAVENOUS

## 2017-05-28 SURGICAL SUPPLY — 35 items
ARMBAND PINK RESTRICT EXTREMIT (MISCELLANEOUS) ×6 IMPLANT
CANISTER SUCT 3000ML PPV (MISCELLANEOUS) ×3 IMPLANT
CLIP VESOCCLUDE MED 6/CT (CLIP) ×3 IMPLANT
CLIP VESOCCLUDE SM WIDE 6/CT (CLIP) ×3 IMPLANT
COVER PROBE W GEL 5X96 (DRAPES) IMPLANT
DECANTER SPIKE VIAL GLASS SM (MISCELLANEOUS) ×3 IMPLANT
DERMABOND ADVANCED (GAUZE/BANDAGES/DRESSINGS) ×2
DERMABOND ADVANCED .7 DNX12 (GAUZE/BANDAGES/DRESSINGS) ×1 IMPLANT
ELECT REM PT RETURN 9FT ADLT (ELECTROSURGICAL) ×3
ELECTRODE REM PT RTRN 9FT ADLT (ELECTROSURGICAL) ×1 IMPLANT
GLOVE BIO SURGEON STRL SZ 6.5 (GLOVE) ×4 IMPLANT
GLOVE BIO SURGEON STRL SZ7 (GLOVE) ×3 IMPLANT
GLOVE BIO SURGEONS STRL SZ 6.5 (GLOVE) ×2
GLOVE BIOGEL PI IND STRL 6.5 (GLOVE) ×1 IMPLANT
GLOVE BIOGEL PI IND STRL 7.5 (GLOVE) ×1 IMPLANT
GLOVE BIOGEL PI INDICATOR 6.5 (GLOVE) ×2
GLOVE BIOGEL PI INDICATOR 7.5 (GLOVE) ×2
GLOVE SURG SS PI 7.0 STRL IVOR (GLOVE) ×3 IMPLANT
GOWN STRL REUS W/ TWL LRG LVL3 (GOWN DISPOSABLE) ×2 IMPLANT
GOWN STRL REUS W/ TWL XL LVL3 (GOWN DISPOSABLE) ×1 IMPLANT
GOWN STRL REUS W/TWL LRG LVL3 (GOWN DISPOSABLE) ×4
GOWN STRL REUS W/TWL XL LVL3 (GOWN DISPOSABLE) ×2
HEMOSTAT SPONGE AVITENE ULTRA (HEMOSTASIS) IMPLANT
KIT BASIN OR (CUSTOM PROCEDURE TRAY) ×3 IMPLANT
KIT ROOM TURNOVER OR (KITS) ×3 IMPLANT
NS IRRIG 1000ML POUR BTL (IV SOLUTION) ×3 IMPLANT
PACK CV ACCESS (CUSTOM PROCEDURE TRAY) ×3 IMPLANT
PAD ARMBOARD 7.5X6 YLW CONV (MISCELLANEOUS) ×6 IMPLANT
SUT MNCRL AB 4-0 PS2 18 (SUTURE) ×3 IMPLANT
SUT PROLENE 6 0 BV (SUTURE) IMPLANT
SUT PROLENE 7 0 BV 1 (SUTURE) ×3 IMPLANT
SUT VIC AB 3-0 SH 27 (SUTURE) ×2
SUT VIC AB 3-0 SH 27X BRD (SUTURE) ×1 IMPLANT
UNDERPAD 30X30 (UNDERPADS AND DIAPERS) ×3 IMPLANT
WATER STERILE IRR 1000ML POUR (IV SOLUTION) ×3 IMPLANT

## 2017-05-28 NOTE — Telephone Encounter (Signed)
Sched appt 07/03/17 at 1:45. Lm on cell#.

## 2017-05-28 NOTE — Discharge Instructions (Signed)
° °  [facility]  Discharge Instructions  AV Fistula or Graft Surgery for Dialysis Access  Please refer to the following instructions for your post-procedure care. Your surgeon or physician assistant will discuss any changes with you.  Activity  You may drive the day following your surgery, if you are comfortable and no longer taking prescription pain medication. Resume full activity as the soreness in your incision resolves.  Bathing/Showering  You may shower after you go home. Keep your incision dry for 48 hours. Do not soak in a bathtub, hot tub, or swim until the incision heals completely. You may not shower if you have a hemodialysis catheter.  Incision Care  Clean your incision with mild soap and water after 48 hours. Pat the area dry with a clean towel. You do not need a bandage unless otherwise instructed. Do not apply any ointments or creams to your incision. You may have skin glue on your incision. Do not peel it off. It will come off on its own in about one week. Your arm may swell a bit after surgery. To reduce swelling use pillows to elevate your arm so it is above your heart. Your doctor will tell you if you need to lightly wrap your arm with an ACE bandage.  Diet  Resume your normal diet. There are not special food restrictions following this procedure. In order to heal from your surgery, it is CRITICAL to get adequate nutrition. Your body requires vitamins, minerals, and protein. Vegetables are the best source of vitamins and minerals. Vegetables also provide the perfect balance of protein. Processed food has little nutritional value, so try to avoid this.  Medications  Resume taking all of your medications. If your incision is causing pain, you may take over-the counter pain relievers such as acetaminophen (Tylenol). If you were prescribed a stronger pain medication, please be aware these medications can cause nausea and constipation. Prevent  nausea by taking the medication with a snack or meal. Avoid constipation by drinking plenty of fluids and eating foods with high amount of fiber, such as fruits, vegetables, and grains. Do not take Tylenol if you are taking prescription pain medications.  Follow up Your surgeon may want to see you in the office following your access surgery. If so, this will be arranged at the time of your surgery.  Please call us immediately for any of the following conditions:  Increased pain, redness, drainage (pus) from your incision site Fever of 101 degrees or higher Severe or worsening pain at your incision site Hand pain or numbness.  Reduce your risk of vascular disease:  Stop smoking. If you would like help, call [facility] at 1-800-QUIT-NOW 724-565-5485) or [place] at [place] your cholesterol Maintain a desired weight Control your diabetes Keep your blood pressure down  Dialysis  It will take several weeks to several months for your new dialysis access to be ready for use. Your surgeon will determine when it is OK to use it. Your nephrologist will continue to direct your dialysis. You can continue to use your Permcath until your new access is ready for use.   05/28/2017 Thomas Robinson [id number] [birth year]  Surgeon(s): Conrad Forestdale, MD  Procedure(s): LEFT ARM ARTERIOVENOUS (AV) FISTULA CREATION  x Do not stick fistula for 12 weeks    If you have any questions, please call the office at [phone].

## 2017-05-28 NOTE — Interval H&P Note (Signed)
   History and Physical Update  The patient was interviewed and re-examined.  The patient's previous History and Physical has been reviewed and is unchanged from Dr. Luther Parody consult.  There is no change in the plan of care: L RC vs BC AVF.   Risk, benefits, and alternatives to access surgery were discussed.    The patient is aware the risks include but are not limited to: bleeding, infection, steal syndrome, nerve damage, ischemic monomelic neuropathy, thrombosis, failure to mature, complications related to venous hypertension, need for additional procedures, death and stroke.    The patient agrees to proceed forward with the procedure.   Adele Barthel, MD, FACS [facility] [place] Office: [phone] Pager: [phone]  05/28/2017, 7:06 AM

## 2017-05-28 NOTE — Progress Notes (Signed)
PROGRESS NOTE    Thomas Robinson  NOB:[id number] DOB: 14-Dec-1960 DOA: 05/24/2017 PCP: Patient, No Pcp Per   Brief Narrative: 56 year old gentleman with history of chronic kidney disease status 5, hypertension, hyperlipidemia, history of a stroke presenting to the ER with nausea vomiting or abdominal pain, anorexia and generalized fatigue. He was found to have BUN of 20-25, creatinine of 15.22 with hyperkalemia. Hemodialysis catheter was placed by IR on September 24 and started on hemodialysis.   Assessment & Plan:   # Newly diagnosed ESRD: Patient with C daily and uremic symptoms on admission. Currently on hemodialysis treatment by nephrology. Has tunneled catheter. Vascular surgery is following and underwent left radiocephalic AV fistula placement on September 2017. Follow-up outpatient hemodialysis center evaluation for discharge planning. Monitor electrolytes and blood pressure.  #Symptomatic anemia due to anemia of chronic disease:  -Fecal occult blood test negative. Patient received red blood cell transfusion. -If or and IV iron during dialysis as per nephrology. -Patient is clinically feeling better. -Hemoglobin 7.6 today.  #Hypertensive urgency on admission: Continue Norvasc, hydralazine, metoprolol. Monitor blood pressure closely.  Continue current supportive care.  DVT prophylaxis: SCD. No anticoagulant isn't because of vascular procedure today and anemia. Code Status: Full code Family Communication: No family at bedside Disposition Plan: Discharge when outpatient hemodialysis is set up     Consultants:   Nephrology  Vascular surgery  Procedures: Tunneled dialysis catheter AV fistula creation  Antimicrobials: None Subjective: Seen and examined at bedside. Denied headache, dizziness, nausea vomiting chest pressure shortness of breath.  Objective: Vitals:   05/28/17 0900 05/28/17 0915 05/28/17 0930 05/28/17 0945  BP:  (!) 149/68 (!) 149/68 (!) 153/65  Pulse:  74 65 66 67  Resp: 11 11 16 18   Temp:      TempSrc:      SpO2: 99% 99% 99% 97%  Weight:      Height:        Intake/Output Summary (Last 24 hours) at 05/28/17 1443 Last data filed at 05/28/17 0945  Gross per 24 hour  Intake              515 ml  Output              585 ml  Net              -70 ml   Filed Weights   05/26/17 2132 05/27/17 1200 05/27/17 2224  Weight: 52.3 kg (115 lb 4.8 oz) 53.9 kg (118 lb 13.3 oz) 53.5 kg (118 lb)    Examination:  General exam: Appears calm and comfortable  Respiratory system: Clear to auscultation. Respiratory effort normal. No wheezing or crackle Cardiovascular system: S1 & S2 heard, RRR.  No pedal edema. Gastrointestinal system: Abdomen is nondistended, soft and nontender. Normal bowel sounds heard. Central nervous system: Alert and oriented. No focal neurological deficits. Skin: No rashes, lesions or ulcers Psychiatry: Judgement and insight appear normal. Mood & affect appropriate.  AV fistula site with no bleeding.   Data Reviewed: I have personally reviewed following labs and imaging studies  CBC:  Recent Labs Lab 05/25/17 0643 05/25/17 1546 05/26/17 0943 05/27/17 1224 05/28/17 0545  WBC 7.8 8.9 5.4 4.8 5.2  HGB 8.0* 7.5* 7.2* 6.8* 7.6*  HCT 24.1* 22.7* 20.7* 20.1* 22.0*  MCV 82.8 82.8 81.2 82.4 84.6  PLT 203 213 164 155 947*   Basic Metabolic Panel:  Recent Labs Lab 05/25/17 0643 05/25/17 1546 05/26/17 0944 05/27/17 1223 05/28/17 0545  NA 134* 132* 135 130*  135  K 5.5* 5.7* 3.6 3.0* 3.2*  CL 101 100* 99* 95* 100*  CO2 8* <7* 17* 26 25  GLUCOSE 88 101* 90 134* 127*  BUN 222* 227* 113* 52* 27*  CREATININE 15.18* 15.74* 9.64* 6.22* 3.96*  CALCIUM 8.8* 9.0 8.5* 8.6* 8.3*  PHOS  --  4.7* 10.5* 5.8*  --    GFR: Estimated Creatinine Clearance: 15.4 mL/min (A) (by C-G formula based on SCr of 3.96 mg/dL (H)). Liver Function Tests:  Recent Labs Lab 05/24/17 1720 05/25/17 1546 05/26/17 0944 05/27/17 1223  AST 6*   --   --   --   ALT 11*  --   --   --   ALKPHOS 65  --   --   --   BILITOT 0.5  --   --   --   PROT 5.9*  --   --   --   ALBUMIN 2.7* 2.6* 2.5* 2.3*    Recent Labs Lab 05/24/17 1720  LIPASE 44   No results for input(s): AMMONIA in the last 168 hours. Coagulation Profile:  Recent Labs Lab 05/25/17 0859 05/28/17 0545  INR 1.22 1.06   Cardiac Enzymes: No results for input(s): CKTOTAL, CKMB, CKMBINDEX, TROPONINI in the last 168 hours. BNP (last 3 results) No results for input(s): PROBNP in the last 8760 hours. HbA1C: No results for input(s): HGBA1C in the last 72 hours. CBG:  Recent Labs Lab 05/26/17 0120 05/26/17 0435 05/26/17 0807 05/27/17 0743 05/28/17 0503  GLUCAP 73 83 94 112* 126*   Lipid Profile: No results for input(s): CHOL, HDL, LDLCALC, TRIG, CHOLHDL, LDLDIRECT in the last 72 hours. Thyroid Function Tests: No results for input(s): TSH, T4TOTAL, FREET4, T3FREE, THYROIDAB in the last 72 hours. Anemia Panel: No results for input(s): VITAMINB12, FOLATE, FERRITIN, TIBC, IRON, RETICCTPCT in the last 72 hours. Sepsis Labs: No results for input(s): PROCALCITON, LATICACIDVEN in the last 168 hours.  Recent Results (from the past 240 hour(s))  Surgical pcr screen     Status: Abnormal   Collection Time: 05/27/17  8:27 PM  Result Value Ref Range Status   MRSA, PCR NEGATIVE NEGATIVE Final   Staphylococcus aureus POSITIVE (A) NEGATIVE Final    Comment: (NOTE) The Xpert SA Assay (FDA approved for NASAL specimens in patients 26 years of age and older), is one component of a comprehensive surveillance program. It is not intended to diagnose infection nor to guide or monitor treatment.          Radiology Studies: No results found.      Scheduled Meds: . amLODipine  10 mg Oral Daily  . atorvastatin  10 mg Oral q1800  . cefUROXime  1.5 g Intravenous To OR  . Chlorhexidine Gluconate Cloth  6 each Topical Daily  . darbepoetin (ARANESP) injection - DIALYSIS   40 mcg Intravenous Q Mon-HD  . feeding supplement (NEPRO CARB STEADY)  237 mL Oral BID BM  . hydrALAZINE  50 mg Oral TID  . metoprolol tartrate  75 mg Oral BID  . multivitamin  1 tablet Oral QHS  . mupirocin ointment  1 application Nasal BID  . mupirocin ointment      . pantoprazole (PROTONIX) IV  40 mg Intravenous Q24H  . sucroferric oxyhydroxide  500 mg Oral TID WC   Continuous Infusions:   LOS: 4 days    Shanda Cadotte Tanna Furry, MD [facility] Pager [phone]  If 7PM-7AM, please contact night-coverage [email or web address] Password [facility] 05/28/2017, 2:43 PM

## 2017-05-28 NOTE — Transfer of Care (Signed)
Immediate Anesthesia Transfer of Care Note  Patient: Thomas Robinson  Procedure(s) Performed: Procedure(s): LEFT ARM ARTERIOVENOUS (AV) FISTULA CREATION (Left)  Patient Location: PACU  Anesthesia Type:MAC  Level of Consciousness: lethargic and responds to stimulation  Airway & Oxygen Therapy: Patient Spontanous Breathing and Patient connected to nasal cannula oxygen  Post-op Assessment: Report given to RN and Post -op Vital signs reviewed and stable  Post vital signs: Reviewed and stable  Last Vitals:  Vitals:   05/28/17 0502 05/28/17 0853  BP: (!) 162/62   Pulse: 82   Resp: 18   Temp: 37.2 C 36.8 C  SpO2: 97%     Last Pain:  Vitals:   05/28/17 0502  TempSrc: Oral  PainSc:          Complications: No apparent anesthesia complications

## 2017-05-28 NOTE — H&P (View-Only) (Signed)
HPI: Thomas Robinson is a 56 y.o. male seen in consultation for long-term dialysis access placement. He is a 57 year old gentleman with progressive renal insufficiency. He had been lost to follow-up and now presents to the hospital with severe uremic symptoms. He had a right IJ tunneled hemodialysis catheter placed by interventional radiology earlier today. He is not initiated his initial session yet. His had no prior hemodialysis access. He does not have any pacemakers or other central lines. Does have a history of stroke. He is minimally conversant. He was able to tell me that he is right handed. Very sedated.  Past Medical History:  Diagnosis Date  . Asthma   . Chronic kidney disease   . Hyperlipidemia   . Hypertension   . Stroke ([place])   . Symptomatic anemia 05/24/2017    Family History  Problem Relation Age of Onset  . Cancer Mother     SOCIAL HISTORY: Social History  Substance Use Topics  . Smoking status: Never Smoker  . Smokeless tobacco: Never Used  . Alcohol use No     Comment: fORMER DRINKER    No Known Allergies  Current Facility-Administered Medications  Medication Dose Route Frequency Provider Last Rate Last Dose  . 0.9 %  sodium chloride infusion  250 mL Intravenous PRN Opyd, Ilene Qua, MD      . 0.9 %  sodium chloride infusion  100 mL Intravenous PRN Alric Seton, PA-C      . 0.9 %  sodium chloride infusion  100 mL Intravenous PRN Alric Seton, PA-C      . acetaminophen (TYLENOL) tablet 650 mg  650 mg Oral Q6H PRN Opyd, Ilene Qua, MD       Or  . acetaminophen (TYLENOL) suppository 650 mg  650 mg Rectal Q6H PRN Opyd, Ilene Qua, MD      . albuterol (PROVENTIL) (2.5 MG/3ML) 0.083% nebulizer solution 2.5 mg  2.5 mg Nebulization Q4H PRN Opyd, Ilene Qua, MD      . alteplase (CATHFLO ACTIVASE) injection 2 mg  2 mg Intracatheter Once PRN Alric Seton, PA-C      . amLODipine (NORVASC) tablet 10 mg  10 mg Oral Daily Opyd, Ilene Qua, MD   10 mg at 05/25/17 1034   . atorvastatin (LIPITOR) tablet 10 mg  10 mg Oral q1800 Opyd, Ilene Qua, MD      . Darbepoetin Alfa (ARANESP) injection 40 mcg  40 mcg Intravenous Q Mon-HD Alric Seton, PA-C      . diphenhydrAMINE (BENADRYL) 12.5 MG/5ML elixir 50 mg  50 mg Oral QHS PRN Opyd, Ilene Qua, MD      . fentaNYL (SUBLIMAZE) 100 MCG/2ML injection           . heparin injection 1,000 Units  1,000 Units Dialysis PRN Alric Seton, PA-C      . heparin injection 1,200 Units  20 Units/kg Dialysis PRN Alric Seton, PA-C   1,200 Units at 05/25/17 1607  . hydrALAZINE (APRESOLINE) injection 10 mg  10 mg Intravenous Q4H PRN Opyd, Ilene Qua, MD      . hydrALAZINE (APRESOLINE) tablet 50 mg  50 mg Oral TID Opyd, Ilene Qua, MD   50 mg at 05/25/17 1035  . lidocaine (PF) (XYLOCAINE) 1 % injection 5 mL  5 mL Intradermal PRN Alric Seton, PA-C      . lidocaine (PF) (XYLOCAINE) 1 % injection           . lidocaine-prilocaine (EMLA) cream 1 application  1 application Topical PRN  Alric Seton, PA-C      . metoprolol tartrate (LOPRESSOR) tablet 75 mg  75 mg Oral BID Vianne Bulls, MD   75 mg at 05/25/17 1035  . midazolam (VERSED) 2 MG/2ML injection           . morphine 4 MG/ML injection 2-4 mg  2-4 mg Intravenous Q3H PRN Opyd, Ilene Qua, MD   4 mg at 05/25/17 1037  . multivitamin (RENA-VIT) tablet 1 tablet  1 tablet Oral QHS Alric Seton, PA-C      . pantoprazole (PROTONIX) injection 40 mg  40 mg Intravenous Q24H Opyd, Ilene Qua, MD   40 mg at 05/25/17 1036  . pentafluoroprop-tetrafluoroeth (GEBAUERS) aerosol 1 application  1 application Topical PRN Alric Seton, PA-C      . sodium chloride flush (NS) 0.9 % injection 3 mL  3 mL Intravenous Q12H Opyd, Ilene Qua, MD   3 mL at 05/25/17 1039  . sodium chloride flush (NS) 0.9 % injection 3 mL  3 mL Intravenous Q12H Opyd, Ilene Qua, MD   3 mL at 05/25/17 1040  . sodium chloride flush (NS) 0.9 % injection 3 mL  3 mL Intravenous PRN Opyd, Ilene Qua, MD        Facility-Administered Medications Ordered in Other Encounters  Medication Dose Route Frequency Provider Last Rate Last Dose  . methylPREDNISolone acetate (DEPO-MEDROL) injection 40 mg  40 mg Intra-articular Once [name], [name], MD        REVIEW OF SYSTEMS:  [X]  denotes positive finding, [ ]  denotes negative finding Cardiac  Comments:  Chest pain or chest pressure:    Shortness of breath upon exertion:    Short of breath when lying flat:    Irregular heart rhythm:        Vascular    Pain in calf, thigh, or hip brought on by ambulation:    Pain in feet at night that wakes you up from your sleep:     Blood clot in your veins:    Leg swelling:           PHYSICAL EXAM: Vitals:   05/25/17 1630 05/25/17 1700 05/25/17 1730 05/25/17 1800  BP: 120/70 121/63 128/61 (!) 111/57  Pulse: 65 66 71 79  Resp: 16 18 (!) 24 12  Temp:      TempSrc:      SpO2: 100% 100% 100% 100%  Weight:      Height:        GENERAL: The patient is a well-nourished male, in no acute distress. The vital signs are documented above. CARDIOVASCULAR: Palpable radial pulses bilaterally. A visible but relatively small cephalic veins bilaterally. He does have a IV in his cephalic vein at the antecubital space in the right and the midforearm on the left. PULMONARY: There is good air exchange  MUSCULOSKELETAL: There are no major deformities or cyanosis. NEUROLOGIC: No focal weakness or paresthesias are detected. SKIN: There are no ulcers or rashes noted. PSYCHIATRIC: The patient has a normal affect.  DATA:  Vein mapping pending  MEDICAL ISSUES: Discussed need for long-term hemodialysis with patient. Not sure he understood. Will obtain vein mapping of the both arms and follow with you. Will need long-term access prior to discharge    Rosetta Posner, MD [facility] Tel [phone] Pager [phone]

## 2017-05-28 NOTE — Anesthesia Preprocedure Evaluation (Signed)
Anesthesia Evaluation  Patient identified by MRN, date of birth, ID band Patient awake    Reviewed: Allergy & Precautions, NPO status , Patient's Chart, lab work & pertinent test results  Airway Mallampati: II  TM Distance: >3 FB     Dental   Pulmonary asthma ,    breath sounds clear to auscultation       Cardiovascular hypertension,  Rhythm:Regular Rate:Normal     Neuro/Psych    GI/Hepatic negative GI ROS, Neg liver ROS,   Endo/Other  negative endocrine ROS  Renal/GU Renal disease     Musculoskeletal   Abdominal   Peds  Hematology  (+) anemia ,   Anesthesia Other Findings   Reproductive/Obstetrics                             Anesthesia Physical Anesthesia Plan  ASA: III  Anesthesia Plan: MAC   Post-op Pain Management:    Induction:   PONV Risk Score and Plan: 2 and Ondansetron, Dexamethasone and Treatment may vary due to age or medical condition  Airway Management Planned: Simple Face Mask  Additional Equipment:   Intra-op Plan:   Post-operative Plan:   Informed Consent: I have reviewed the patients History and Physical, chart, labs and discussed the procedure including the risks, benefits and alternatives for the proposed anesthesia with the patient or authorized representative who has indicated his/her understanding and acceptance.   Dental advisory given  Plan Discussed with: CRNA and Anesthesiologist  Anesthesia Plan Comments:         Anesthesia Quick Evaluation

## 2017-05-28 NOTE — Op Note (Signed)
OPERATIVE NOTE   PROCEDURE: 1.  Left radiocephaic arteriovenous fistula placement 2.  Ligation of side branch  PRE-OPERATIVE DIAGNOSIS: end stage renal disease   POST-OPERATIVE DIAGNOSIS: same as above   SURGEON: Adele Barthel, MD  ASSISTANT(S): Leontine Locket, PAC   ANESTHESIA: local and MAC  ESTIMATED BLOOD LOSS: 50 cc  FINDING(S): 1.  Cephalic vein: 3 mm, acceptable 2.  Radial artery: 2.5 mm, limited calcific plaque present 3.  Venous outflow: palpable thrill with somewhat pulsatile character proximally 4.  Radial flow: palpable radial pulse  SPECIMEN(S):  none  INDICATIONS:   Thomas Robinson is a 56 y.o. male who presents with end stage renal disease .  The patient is scheduled for left radiocephalic vs brachiocephalic arteriovenous fistula placement.  The patient is aware the risks include but are not limited to: bleeding, infection, steal syndrome, nerve damage, ischemic monomelic neuropathy, failure to mature, and need for additional procedures.  The patient is aware of the risks of the procedure and elects to proceed forward.  DESCRIPTION: After full informed written consent was obtained from the patient, the patient was brought back to the operating room and placed supine upon the operating table.  Prior to induction, the patient received IV antibiotics.   After obtaining adequate anesthesia, the patient was then prepped and draped in the standard fashion for a left arm access procedure.   I turned my attention first to identifying the patient's distal cephalic vein and radial artery.  Using SonoSite guidance, the location of these vessels were marked out on the skin.   At this point, I injected local anesthetic to obtain a field block of the wrist.  In total, I injected about 3 mL of 1% lidocaine without epinephrine.  I made a longitudinal incision at the level of the wrist and dissected through the subcutaneous tissue and fascia to gain exposure of the radial artery.   This was noted to be 2.5 mm in diameter externally.  This was dissected out proximally and distally and controlled with vessel loops .  I then dissected out the adjacent cephalic vein.  This was noted to be 2-3 mm in diameter externally.  The distal segment of the vein was ligated with a  2-0 silk, and the vein was transected.  The proximal segment was interrogated with serial dilators.  The vein accepted up to a 4 mm dilator without any difficulty.  I then instilled the heparinized saline into the vein and clamped it to hydrodistend this vein to 4 mm.  At this point, I reset my exposure of the radial artery and placed the artery under tension proximally and distally.  I made an arteriotomy with a #11 blade, and then I extended the arteriotomy with a Potts scissor.  I injected heparinized saline proximal and distal to this arteriotomy.  The vein was then sewn to the artery in an end-to-side configuration with a running stitch of 7-0 Prolene.  Prior to completing this anastomosis, I allowed the vein and artery to backbleed.  There was no evidence of clot from any vessels.  I completed the anastomosis in the usual fashion and then released all vessel loops and clamps.    There was a palpable thrill in the venous outflow, and there was a palpable radial pulse.  At this point, I turned my attention to a big branch of the cephalic vein on the dorsum of the forearm.  I injected another 2 cc of 1% lidocaine without epinephrine over the branch  point.  I dissected out the vein and ligated it proximally and distally with 2-0 silk and transected the branch.  The skin was reapproximated the skin with a running subcuticular of 4-0 Monocryl.  At this point, I irrigated out the surgical wound.  There was no further active bleeding.  The subcutaneous tissue was reapproximated with a running stitch of 3-0 Vicryl.  The skin was then reapproximated with a running subcuticular stitch of 4-0 Vicryl.  The skin was then cleaned,  dried, and reinforced with Dermabond.  The patient tolerated this procedure well.    COMPLICATIONS: noen  CONDITION: stable   Adele Barthel, MD, FACS [facility] [place] Office: [phone] Pager: [phone]  05/28/2017, 8:42 AM

## 2017-05-28 NOTE — Telephone Encounter (Signed)
-----   Message from Denman George, RN sent at 05/28/2017 10:52 AM EDT ----- Regarding: needs 4-6 wk. f/u with Dr. Bridgett Larsson   ----- Message ----- From: Natividad Brood Sent: 05/28/2017   8:47 AM To: Vvs Charge Pool  S/p left RC AVF.  F/u with Dr. Bridgett Larsson in 4-6 weeks.  thx

## 2017-05-28 NOTE — Progress Notes (Signed)
Patient ID: Thomas Robinson, male   DOB: March 17, 1961, 56 y.o.   MRN: [id number]  [facility] Progress Note    Subjective:   "I feel like 20 bucks"   Objective:   BP (!) 153/65 (BP Location: Right Arm)   Pulse 67   Temp 98.2 F (36.8 C)   Resp 18   Ht 5\' 8"  (1.727 m)   Wt 53.5 kg (118 lb)   SpO2 97%   BMI 17.94 kg/m   Intake/Output: I/O last 3 completed shifts: In: 555 [P.O.:240; Blood:315] Out: 575 [Urine:575]   Intake/Output this shift:  Total I/O In: 200 [I.V.:200] Out: 10 [Blood:10] Weight change: 0.5 kg (1 lb 1.6 oz)  Physical Exam: Gen:NAD CVS:no rub Resp:cta DPO:EUMPNT Ext:no edema, L cimino AVF +T/B but with competing vessel midway up forearm.  Labs: BMET  Recent Labs Lab 05/24/17 1720 05/25/17 0643 05/25/17 1546 05/26/17 0944 05/27/17 1223 05/28/17 0545  NA 131* 134* 132* 135 130* 135  K 5.2* 5.5* 5.7* 3.6 3.0* 3.2*  CL 97* 101 100* 99* 95* 100*  CO2 <7* 8* <7* 17* 26 25  GLUCOSE 119* 88 101* 90 134* 127*  BUN 225* 222* 227* 113* 52* 27*  CREATININE 15.72* 15.18* 15.74* 9.64* 6.22* 3.96*  ALBUMIN 2.7*  --  2.6* 2.5* 2.3*  --   CALCIUM 8.9 8.8* 9.0 8.5* 8.6* 8.3*  PHOS  --   --  4.7* 10.5* 5.8*  --    CBC  Recent Labs Lab 05/25/17 1546 05/26/17 0943 05/27/17 1224 05/28/17 0545  WBC 8.9 5.4 4.8 5.2  HGB 7.5* 7.2* 6.8* 7.6*  HCT 22.7* 20.7* 20.1* 22.0*  MCV 82.8 81.2 82.4 84.6  PLT 213 164 155 148*    @IMGRELPRIORS @ Medications:    . amLODipine  10 mg Oral Daily  . atorvastatin  10 mg Oral q1800  . cefUROXime  1.5 g Intravenous To OR  . Chlorhexidine Gluconate Cloth  6 each Topical Daily  . darbepoetin (ARANESP) injection - DIALYSIS  40 mcg Intravenous Q Mon-HD  . feeding supplement (NEPRO CARB STEADY)  237 mL Oral BID BM  . hydrALAZINE  50 mg Oral TID  . metoprolol tartrate  75 mg Oral BID  . multivitamin  1 tablet Oral QHS  . mupirocin ointment  1 application Nasal BID  . mupirocin ointment      .  pantoprazole (PROTONIX) IV  40 mg Intravenous Q24H  . sodium chloride flush  3 mL Intravenous Q12H  . sodium chloride flush  3 mL Intravenous Q12H  . sucroferric oxyhydroxide  500 mg Oral TID WC     Assessment/ Plan:   1. New ESRD with significant uremic symptoms. s/p 3rd session 05/27/17 and plan for HD tomorrow.  HIV Neg He B and C neg. Discussed with nursing educational videos he needs to watch however he was unable to view them yesterday will try again today.  Discussed outpatient HD as well as PD.  2. Anemia of CKD stage 5- s/p 2 units PRBC, now on ESA hgb 5.3 > 7.2 - ESA started 23% sat ferritin 109 folate- iron studies drawn before transfusion- 1. Continue with EPO and give partial course of Fe 3. SHPTH- hectoral with HD- need ipTH   P 10.5 - start binder here but will need to figure out affordable option after discharge  1. Will add sevelamer and follow 4. HTN/volume- stable, on MTP 75 bid and norvasc 10 5. Vascular access; pt is right handed s/p HD cath  and L cimino AVF placement 05/28/17 by Dr. Bridgett Larsson but with visible competing vessel.  F/u with [facility] 6. Social- no health insurance, consider starting PD once uremia improved to get immediate medicare coverage. 7. PCM - weight loss of ~40# over time - used to weigh 160# alb - low- starting to eat today- add nepro  8. Disposition- will need to be set up for outpatient dialysis, CLIP process started.   Donetta Potts, MD [place] Pager [phone] 05/28/2017, 11:03 AM

## 2017-05-28 NOTE — Anesthesia Postprocedure Evaluation (Signed)
Anesthesia Post Note  Patient: Thomas Robinson  Procedure(s) Performed: Procedure(s) (LRB): LEFT ARM ARTERIOVENOUS (AV) FISTULA CREATION (Left)     Patient location during evaluation: PACU Anesthesia Type: MAC Level of consciousness: awake Pain management: pain level controlled Vital Signs Assessment: post-procedure vital signs reviewed and stable Respiratory status: spontaneous breathing Cardiovascular status: stable Anesthetic complications: no    Last Vitals:  Vitals:   05/28/17 0900 05/28/17 0915  BP:  (!) 149/68  Pulse: 74 65  Resp: 11 11  Temp:    SpO2: 99% 99%    Last Pain:  Vitals:   05/28/17 0915  TempSrc:   PainSc: 0-No pain                 Marijane Trower

## 2017-05-28 NOTE — Anesthesia Procedure Notes (Signed)
Procedure Name: MAC Date/Time: 05/28/2017 7:35 AM Performed by: Salli Quarry Erlene Devita Pre-anesthesia Checklist: Patient identified, Emergency Drugs available, Suction available and Patient being monitored Patient Re-evaluated:Patient Re-evaluated prior to induction Oxygen Delivery Method: Nasal cannula

## 2017-05-28 NOTE — Anesthesia Procedure Notes (Incomplete)
Procedures

## 2017-05-29 ENCOUNTER — Encounter (HOSPITAL_COMMUNITY): Payer: Self-pay | Admitting: Vascular Surgery

## 2017-05-29 LAB — BASIC METABOLIC PANEL
Anion gap: 10 (ref 5–15)
BUN: 49 mg/dL — AB (ref 6–20)
CHLORIDE: 99 mmol/L — AB (ref 101–111)
CO2: 24 mmol/L (ref 22–32)
CREATININE: 5.74 mg/dL — AB (ref 0.61–1.24)
Calcium: 8.4 mg/dL — ABNORMAL LOW (ref 8.9–10.3)
GFR calc Af Amer: 11 mL/min — ABNORMAL LOW (ref >60)
GFR calc non Af Amer: 10 mL/min — ABNORMAL LOW (ref >60)
GLUCOSE: 109 mg/dL — AB (ref 65–99)
POTASSIUM: 3.4 mmol/L — AB (ref 3.5–5.1)
SODIUM: 133 mmol/L — AB (ref 135–145)

## 2017-05-29 LAB — CBC
HEMATOCRIT: 23.1 % — AB (ref 39.0–52.0)
Hemoglobin: 7.7 g/dL — ABNORMAL LOW (ref 13.0–17.0)
MCH: 28.7 pg (ref 26.0–34.0)
MCHC: 33.3 g/dL (ref 30.0–36.0)
MCV: 86.2 fL (ref 78.0–100.0)
Platelets: 149 10*3/uL — ABNORMAL LOW (ref 150–400)
RBC: 2.68 MIL/uL — AB (ref 4.22–5.81)
RDW: 13.7 % (ref 11.5–15.5)
WBC: 5.5 10*3/uL (ref 4.0–10.5)

## 2017-05-29 MED ORDER — HEPARIN SODIUM (PORCINE) 1000 UNIT/ML DIALYSIS: 20.0000 [IU]/kg | INTRAMUSCULAR | Status: DC | PRN

## 2017-05-29 MED ORDER — PANTOPRAZOLE SODIUM 40 MG PO TBEC
40.0000 mg | DELAYED_RELEASE_TABLET | Freq: Every day | ORAL | Status: DC
  Administered 2017-05-30 – 2017-06-01 (×3): 40 mg via ORAL
  Filled 2017-05-29 (×3): qty 1

## 2017-05-29 MED ORDER — OXYCODONE-ACETAMINOPHEN 5-325 MG PO TABS
ORAL_TABLET | ORAL | Status: AC
  Filled 2017-05-29: qty 1

## 2017-05-29 NOTE — Progress Notes (Signed)
PROGRESS NOTE    Thomas Robinson  BPZ:[id number] DOB: 05-25-1961 DOA: 05/24/2017 PCP: Patient, No Pcp Per   Brief Narrative: 56 year old gentleman with history of chronic kidney disease status 5, hypertension, hyperlipidemia, history of a stroke presenting to the ER with nausea vomiting or abdominal pain, anorexia and generalized fatigue. He was found to have BUN of 20-25, creatinine of 15.22 with hyperkalemia. Hemodialysis catheter was placed by IR on September 24 and started on hemodialysis.   Assessment & Plan:   # Newly diagnosed ESRD:  -Receiving hemodialysis treatment by a tunneled catheter. Tolerating well. -Patient underwent left radiocephalic AV fistula placement on September 2017 by vascular surgery.  Follow-up outpatient hemodialysis center evaluation for discharge planning. Monitor electrolytes and blood pressure. -Nephrology consult appreciated  #Symptomatic anemia due to anemia of chronic disease:  -Fecal occult blood test negative. Patient received red blood cell transfusion. -ESA and IV iron during dialysis as per nephrology. -Patient is clinically feeling better. -Hemoglobin 7.7 today.  #Hypertensive urgency on admission: Continue Norvasc, hydralazine, metoprolol. Monitor blood pressure closely. Blood pressure improving.  Continue current supportive care. PT OT evaluation while in the hospital.  DVT prophylaxis: SCD and ambulation. No anticoagulant because of anemia Code Status: Full code Family Communication: No family at bedside Disposition Plan: Discharge when outpatient hemodialysis is set up . Discussed with the case manager, nephrologist and with the patient    Consultants:   Nephrology  Vascular surgery  Procedures: Tunneled dialysis catheter AV fistula creation  Antimicrobials: None Subjective: Seen and examined at dialysis unit. Denied headache, disease, nausea vomiting or chest pain.  Objective: Vitals:   05/29/17 1030 05/29/17 1100  05/29/17 1130 05/29/17 1155  BP: 137/72 135/70 (!) 137/48 117/60  Pulse: 78 70 66 86  Resp:    18  Temp:    98 F (36.7 C)  TempSrc:    Oral  SpO2:    98%  Weight:    55.7 kg (122 lb 12.7 oz)  Height:        Intake/Output Summary (Last 24 hours) at 05/29/17 1212 Last data filed at 05/29/17 1155  Gross per 24 hour  Intake              100 ml  Output             1000 ml  Net             -900 ml   Filed Weights   05/27/17 2224 05/29/17 0825 05/29/17 1155  Weight: 53.5 kg (118 lb) 56.7 kg (125 lb) 55.7 kg (122 lb 12.7 oz)    Examination:  General exam: Not in distress  Respiratory system: Clear bilateral, respiratory effort normal Cardiovascular system: Regular rate rhythm, S1-S2 normal. No pedal edema. Gastrointestinal system: Abdomen is nondistended, soft and nontender. Normal bowel sounds heard. Central nervous system: Alert and oriented. No focal neurological deficits. Skin: No rashes, lesions or ulcers Psychiatry: Judgement and insight appear normal. Mood & affect appropriate.  AV fistula site with no bleeding.   Data Reviewed: I have personally reviewed following labs and imaging studies  CBC:  Recent Labs Lab 05/25/17 1546 05/26/17 0943 05/27/17 1224 05/28/17 0545 05/29/17 0315  WBC 8.9 5.4 4.8 5.2 5.5  HGB 7.5* 7.2* 6.8* 7.6* 7.7*  HCT 22.7* 20.7* 20.1* 22.0* 23.1*  MCV 82.8 81.2 82.4 84.6 86.2  PLT 213 164 155 148* 242*   Basic Metabolic Panel:  Recent Labs Lab 05/25/17 1546 05/26/17 0944 05/27/17 1223 05/28/17 0545 05/29/17 0315  NA 132* 135 130* 135 133*  K 5.7* 3.6 3.0* 3.2* 3.4*  CL 100* 99* 95* 100* 99*  CO2 <7* 17* 26 25 24   GLUCOSE 101* 90 134* 127* 109*  BUN 227* 113* 52* 27* 49*  CREATININE 15.74* 9.64* 6.22* 3.96* 5.74*  CALCIUM 9.0 8.5* 8.6* 8.3* 8.4*  PHOS 4.7* 10.5* 5.8*  --   --    GFR: Estimated Creatinine Clearance: 11.1 mL/min (A) (by C-G formula based on SCr of 5.74 mg/dL (H)). Liver Function Tests:  Recent Labs Lab  05/24/17 1720 05/25/17 1546 05/26/17 0944 05/27/17 1223  AST 6*  --   --   --   ALT 11*  --   --   --   ALKPHOS 65  --   --   --   BILITOT 0.5  --   --   --   PROT 5.9*  --   --   --   ALBUMIN 2.7* 2.6* 2.5* 2.3*    Recent Labs Lab 05/24/17 1720  LIPASE 44   No results for input(s): AMMONIA in the last 168 hours. Coagulation Profile:  Recent Labs Lab 05/25/17 0859 05/28/17 0545  INR 1.22 1.06   Cardiac Enzymes: No results for input(s): CKTOTAL, CKMB, CKMBINDEX, TROPONINI in the last 168 hours. BNP (last 3 results) No results for input(s): PROBNP in the last 8760 hours. HbA1C: No results for input(s): HGBA1C in the last 72 hours. CBG:  Recent Labs Lab 05/26/17 0120 05/26/17 0435 05/26/17 0807 05/27/17 0743 05/28/17 0503  GLUCAP 73 83 94 112* 126*   Lipid Profile: No results for input(s): CHOL, HDL, LDLCALC, TRIG, CHOLHDL, LDLDIRECT in the last 72 hours. Thyroid Function Tests: No results for input(s): TSH, T4TOTAL, FREET4, T3FREE, THYROIDAB in the last 72 hours. Anemia Panel: No results for input(s): VITAMINB12, FOLATE, FERRITIN, TIBC, IRON, RETICCTPCT in the last 72 hours. Sepsis Labs: No results for input(s): PROCALCITON, LATICACIDVEN in the last 168 hours.  Recent Results (from the past 240 hour(s))  Surgical pcr screen     Status: Abnormal   Collection Time: 05/27/17  8:27 PM  Result Value Ref Range Status   MRSA, PCR NEGATIVE NEGATIVE Final   Staphylococcus aureus POSITIVE (A) NEGATIVE Final    Comment: (NOTE) The Xpert SA Assay (FDA approved for NASAL specimens in patients 41 years of age and older), is one component of a comprehensive surveillance program. It is not intended to diagnose infection nor to guide or monitor treatment.          Radiology Studies: No results found.      Scheduled Meds: . amLODipine  10 mg Oral Daily  . atorvastatin  10 mg Oral q1800  . Chlorhexidine Gluconate Cloth  6 each Topical Daily  . darbepoetin  (ARANESP) injection - DIALYSIS  40 mcg Intravenous Q Mon-HD  . feeding supplement (NEPRO CARB STEADY)  237 mL Oral BID BM  . hydrALAZINE  50 mg Oral TID  . metoprolol tartrate  75 mg Oral BID  . multivitamin  1 tablet Oral QHS  . mupirocin ointment  1 application Nasal BID  . pantoprazole (PROTONIX) IV  40 mg Intravenous Q24H  . sucroferric oxyhydroxide  500 mg Oral TID WC   Continuous Infusions:   LOS: 5 days    Dron Tanna Furry, MD [facility] Pager [phone]  If 7PM-7AM, please contact night-coverage [email or web address] Password [email or web address] 05/29/2017, 12:12 PM

## 2017-05-29 NOTE — Progress Notes (Signed)
[facility] [place]  Subjective  - Doing good over all.   Objective 117/60 86 98 F (36.7 C) (Oral) 18 98%  Intake/Output Summary (Last 24 hours) at 05/29/17 1406 Last data filed at 05/29/17 1155  Gross per 24 hour  Intake              100 ml  Output             1000 ml  Net             -900 ml    Left radio cephalic fistula with palpable thrill Left hand N/V/M intact Incision healing fine  Assessment/Planning: POD # 1  PROCEDURE: 1.  Left radiocephaic arteriovenous fistula placement 2.  Ligation of side branch F/U with Dr. Bridgett Larsson in 6-7 weeks  Theda Sers [place] 05/29/2017 2:06 PM --  Laboratory Lab Results:  Recent Labs  05/28/17 0545 05/29/17 0315  WBC 5.2 5.5  HGB 7.6* 7.7*  HCT 22.0* 23.1*  PLT 148* 149*   BMET  Recent Labs  05/28/17 0545 05/29/17 0315  NA 135 133*  K 3.2* 3.4*  CL 100* 99*  CO2 25 24  GLUCOSE 127* 109*  BUN 27* 49*  CREATININE 3.96* 5.74*  CALCIUM 8.3* 8.4*    COAG Lab Results  Component Value Date   INR 1.06 05/28/2017   INR 1.22 05/25/2017   INR 1.16 04/10/2015   No results found for: PTT

## 2017-05-29 NOTE — Procedures (Signed)
I was present at this dialysis session. I have reviewed the session itself and made appropriate changes.   Filed Weights   05/26/17 2132 05/27/17 1200 05/27/17 2224  Weight: 52.3 kg (115 lb 4.8 oz) 53.9 kg (118 lb 13.3 oz) 53.5 kg (118 lb)     Recent Labs Lab 05/27/17 1223  05/29/17 0315  NA 130*  < > 133*  K 3.0*  < > 3.4*  CL 95*  < > 99*  CO2 26  < > 24  GLUCOSE 134*  < > 109*  BUN 52*  < > 49*  CREATININE 6.22*  < > 5.74*  CALCIUM 8.6*  < > 8.4*  PHOS 5.8*  --   --   < > = values in this interval not displayed.   Recent Labs Lab 05/27/17 1224 05/28/17 0545 05/29/17 0315  WBC 4.8 5.2 5.5  HGB 6.8* 7.6* 7.7*  HCT 20.1* 22.0* 23.1*  MCV 82.4 84.6 86.2  PLT 155 148* 149*    Scheduled Meds: . amLODipine  10 mg Oral Daily  . atorvastatin  10 mg Oral q1800  . Chlorhexidine Gluconate Cloth  6 each Topical Daily  . darbepoetin (ARANESP) injection - DIALYSIS  40 mcg Intravenous Q Mon-HD  . feeding supplement (NEPRO CARB STEADY)  237 mL Oral BID BM  . hydrALAZINE  50 mg Oral TID  . metoprolol tartrate  75 mg Oral BID  . multivitamin  1 tablet Oral QHS  . mupirocin ointment  1 application Nasal BID  . pantoprazole (PROTONIX) IV  40 mg Intravenous Q24H  . sucroferric oxyhydroxide  500 mg Oral TID WC   Continuous Infusions: PRN Meds:.acetaminophen **OR** acetaminophen, albuterol, diphenhydrAMINE, hydrALAZINE, morphine injection, ondansetron (ZOFRAN) IV, oxyCODONE-acetaminophen, zolpidem    1. New ESRD with significant uremic symptoms. s/p 3rd session 05/27/17 and now on IHD.  HIV Neg He B and C neg. Discussed with nursing educational videos he needs to watch.  Discussed outpatient HD as well as PD.  1. Feels much better 2. Anemia of CKD stage 5- s/p 2 units PRBC, now on ESA hgb 5.3 > 7.2 - ESA started 23% sat ferritin 109 folate- iron studies drawn before transfusion- 1. Continue with EPO and give partial course of Fe 3. SHPTH- hectoral with HD- need ipTH P 10.5 -  start binder here but will need to figure out affordable option after discharge  1. Will add sevelamer and follow 4. HTN/volume- stable, on MTP 75 bid and norvasc 10 5. Vascular access; pt is right handeds/p HD cath and L cimino AVF placement 05/28/17 by Dr. Bridgett Larsson but with visible competing vessel.  F/u with [facility] 6. Social- no health insurance, consider starting PD once uremia improved to get immediate medicare coverage. 7. PCM - weight loss of ~40# over time - used to weigh 160# alb - low- starting to eat today- add nepro  8. Disposition- will need to be set up for outpatient dialysis, CLIP process started.   Donetta Potts,  MD 05/29/2017, 8:44 AM

## 2017-05-29 NOTE — Evaluation (Signed)
Physical Therapy Evaluation Patient Details Name: Thomas Robinson MRN: [id number] DOB: Sep 28, 1960 Today's Date: 05/29/2017   History of Present Illness  Pt is a 56 y.o. male with medical history significant for chronic kidney disease stage V, hypertension, hyperlipidemia, and history of CVA.  He presented to the emergency department for evaluation of abdominal pain with nausea, vomiting, loose stools, intermittent melena, anorexia, and fatigue.  Pt admitted with AKI and is now undergoing HD.  Clinical Impression  Pt admitted with above diagnosis. Pt currently with functional limitations due to the deficits listed below (see PT Problem List). On eval, pt required min guard assist for transfers and ambulation 150 feet without AD. Pt presents with unsteady gait and generalized weakness. Pt will benefit from skilled PT to increase their independence and safety with mobility to allow discharge to the venue listed below.       Follow Up Recommendations No PT follow up;Supervision - Intermittent    Equipment Recommendations  None recommended by PT    Recommendations for Other Services       Precautions / Restrictions Precautions Precautions: Fall      Mobility  Bed Mobility Overal bed mobility: Modified Independent                Transfers Overall transfer level: Needs assistance Equipment used: None Transfers: Sit to/from [employer] Transfers Sit to Stand: Min guard Stand pivot transfers: Min guard       General transfer comment: increased time to stabilize initial standing balance  Ambulation/Gait Ambulation/Gait assistance: Min guard Ambulation Distance (Feet): 150 Feet Assistive device: None Gait Pattern/deviations: Step-through pattern;Decreased stride length Gait velocity: decreased Gait velocity interpretation: Below normal speed for age/gender General Gait Details: unsteady gait   Stairs            Wheelchair Mobility    Modified Rankin  (Stroke Patients Only)       Balance Overall balance assessment: Needs assistance Sitting-balance support: No upper extremity supported;Feet supported Sitting balance-Leahy Scale: Good     Standing balance support: No upper extremity supported;During functional activity Standing balance-Leahy Scale: Fair Standing balance comment: unsteady                             Pertinent Vitals/Pain Pain Assessment: No/denies pain    Home Living Family/patient expects to be discharged to:: Private residence Living Arrangements: Non-relatives/Friends Available Help at Discharge: Friend(s);Available 24 hours/day Type of Home: House Home Access: Stairs to enter Entrance Stairs-Rails: None Entrance Stairs-Number of Steps: 1 Home Layout: One level Home Equipment: None      Prior Function Level of Independence: Independent               Hand Dominance   Dominant Hand: Right    Extremity/Trunk Assessment   Upper Extremity Assessment Upper Extremity Assessment: Generalized weakness    Lower Extremity Assessment Lower Extremity Assessment: Generalized weakness    Cervical / Trunk Assessment Cervical / Trunk Assessment: Normal  Communication   Communication: No difficulties  Cognition Arousal/Alertness: Awake/alert Behavior During Therapy: WFL for tasks assessed/performed Overall Cognitive Status: Within Functional Limits for tasks assessed                                        General Comments      Exercises     Assessment/Plan    PT Assessment  Patient needs continued PT services  PT Problem List Decreased strength;Decreased activity tolerance;Decreased balance;Decreased mobility;Decreased safety awareness       PT Treatment Interventions Gait training;Stair training;Functional mobility training;Therapeutic activities;Therapeutic exercise;Balance training;Patient/family education    PT Goals (Current goals can be found in the Care  Plan section)  Acute Rehab PT Goals Patient Stated Goal: home PT Goal Formulation: With patient Time For Goal Achievement: 06/12/17 Potential to Achieve Goals: Good    Frequency Min 3X/week   Barriers to discharge        Co-evaluation               AM-PAC PT "6 Clicks" Daily Activity  Outcome Measure Difficulty turning over in bed (including adjusting bedclothes, sheets and blankets)?: None Difficulty moving from lying on back to sitting on the side of the bed? : None Difficulty sitting down on and standing up from a chair with arms (e.g., wheelchair, bedside commode, etc,.)?: A Little Help needed moving to and from a bed to chair (including a wheelchair)?: A Little Help needed walking in hospital room?: A Little Help needed climbing 3-5 steps with a railing? : A Little 6 Click Score: 20    End of Session Equipment Utilized During Treatment: Gait belt Activity Tolerance: Patient tolerated treatment well Patient left: in bed;with call bell/phone within reach Nurse Communication: Mobility status PT Visit Diagnosis: Unsteadiness on feet (R26.81);Muscle weakness (generalized) (M62.81)    Time: 4097-3532 PT Time Calculation (min) (ACUTE ONLY): 26 min   Charges:   PT Evaluation $PT Eval Moderate Complexity: 1 Mod PT Treatments $Gait Training: 8-22 mins   PT G Codes:        Lorrin Goodell, PT  Office # [phone] Pager [phone]   Lorriane Shire 05/29/2017, 1:45 PM

## 2017-05-30 LAB — GLUCOSE, CAPILLARY: GLUCOSE-CAPILLARY: 107 mg/dL — AB (ref 65–99)

## 2017-05-30 MED ORDER — POLYETHYLENE GLYCOL 3350 17 G PO PACK
17.0000 g | PACK | Freq: Every day | ORAL | Status: DC
  Administered 2017-05-30: 17 g via ORAL
  Filled 2017-05-30: qty 1

## 2017-05-30 MED ORDER — SENNOSIDES-DOCUSATE SODIUM 8.6-50 MG PO TABS
1.0000 | ORAL_TABLET | Freq: Two times a day (BID) | ORAL | Status: DC
  Administered 2017-05-30 – 2017-06-01 (×5): 1 via ORAL
  Filled 2017-05-30 (×5): qty 1

## 2017-05-30 NOTE — Progress Notes (Signed)
Patient ID: Thomas Robinson, male   DOB: June 08, 1961, 56 y.o.   MRN: [id number]  [facility] Progress Note    Subjective:   Feels good, no complaints   Objective:   BP (!) 142/75 (BP Location: Right Arm)   Pulse 78   Temp 98.5 F (36.9 C) (Oral)   Resp 18   Ht 5\' 8"  (1.727 m)   Wt 55.7 kg (122 lb 11.9 oz)   SpO2 97%   BMI 18.66 kg/m   Intake/Output: I/O last 3 completed shifts: In: 600 [P.O.:600] Out: 1000 [Other:1000]   Intake/Output this shift:  Total I/O In: 120 [P.O.:120] Out: 0  Weight change:   Physical Exam: Gen:NAD CVS: no rub Resp:cta QMG:NOIBBC Ext: no edema, L avf +T/B  Labs: BMET  Recent Labs Lab 05/24/17 1720 05/25/17 0643 05/25/17 1546 05/26/17 0944 05/27/17 1223 05/28/17 0545 05/29/17 0315  NA 131* 134* 132* 135 130* 135 133*  K 5.2* 5.5* 5.7* 3.6 3.0* 3.2* 3.4*  CL 97* 101 100* 99* 95* 100* 99*  CO2 <7* 8* <7* 17* 26 25 24   GLUCOSE 119* 88 101* 90 134* 127* 109*  BUN 225* 222* 227* 113* 52* 27* 49*  CREATININE 15.72* 15.18* 15.74* 9.64* 6.22* 3.96* 5.74*  ALBUMIN 2.7*  --  2.6* 2.5* 2.3*  --   --   CALCIUM 8.9 8.8* 9.0 8.5* 8.6* 8.3* 8.4*  PHOS  --   --  4.7* 10.5* 5.8*  --   --    CBC  Recent Labs Lab 05/26/17 0943 05/27/17 1224 05/28/17 0545 05/29/17 0315  WBC 5.4 4.8 5.2 5.5  HGB 7.2* 6.8* 7.6* 7.7*  HCT 20.7* 20.1* 22.0* 23.1*  MCV 81.2 82.4 84.6 86.2  PLT 164 155 148* 149*    @IMGRELPRIORS @ Medications:    . amLODipine  10 mg Oral Daily  . atorvastatin  10 mg Oral q1800  . Chlorhexidine Gluconate Cloth  6 each Topical Daily  . darbepoetin (ARANESP) injection - DIALYSIS  40 mcg Intravenous Q Mon-HD  . feeding supplement (NEPRO CARB STEADY)  237 mL Oral BID BM  . hydrALAZINE  50 mg Oral TID  . metoprolol tartrate  75 mg Oral BID  . multivitamin  1 tablet Oral QHS  . mupirocin ointment  1 application Nasal BID  . pantoprazole  40 mg Oral Daily  . polyethylene glycol  17 g Oral Daily  .  senna-docusate  1 tablet Oral BID  . sucroferric oxyhydroxide  500 mg Oral TID WC     Assessment/ Plan:   1. New ESRD with significant uremic symptoms. s/p 3rd session 05/27/17 and now on IHD.  HIV Neg He B and C neg. Discussed with nursing educational videos he needs to watch. Discussed outpatient HD as well as PD.  1. Feels much better after serial dialysis 2. Plan for HD again on Monday and await outpatient schedule once set. 2. Anemia of CKD stage 5- s/p 2 units PRBC, now on ESA hgb 5.3 > 7.2 - ESA started 23% sat ferritin 109 folate- iron studies drawn before transfusion- 1. Continue with EPO and give partial course of Fe 3. SHPTH- hectoral with HD- need ipTH P 10.5 - start binder here but will need to figure out affordable option after discharge  1. On Velphoro 500 qac, renal diet and follow 4. HTN/volume- stable, on MTP 75 bid and norvasc 10 5. Vascular access; pt is right handeds/p HD cath and L ciminoAVF placement 05/28/17 by Dr. Bridgett Larsson but with  visible competing vessel. F/u with [facility] 6. Social- no health insurance, consider starting PD once uremia improved to get immediate medicare coverage. 7. PCM - weight loss of ~40# over time - used to weigh 160# alb - low- starting to eat today- add nepro  8. Disposition- will need to be set up for outpatient dialysis, CLIP process started.   Donetta Potts, MD [place] Pager [phone] 05/30/2017, 1:08 PM

## 2017-05-30 NOTE — Progress Notes (Signed)
PROGRESS NOTE    Thomas Robinson  YQM:[id number] DOB: 01/16/1961 DOA: 05/24/2017 PCP: Patient, No Pcp Per   Brief Narrative: 56 year old gentleman with history of chronic kidney disease status 5, hypertension, hyperlipidemia, history of a stroke presenting to the ER with nausea vomiting or abdominal pain, anorexia and generalized fatigue. He was found to have BUN of 20-25, creatinine of 15.22 with hyperkalemia. Hemodialysis catheter was placed by IR on September 24 and started on hemodialysis.   Assessment & Plan:   # Newly diagnosed ESRD:  -Receiving hemodialysis treatment by a tunneled catheter. Tolerating well. -Patient underwent left radiocephalic AV fistula placement on September 2017 by vascular surgery.  Still looking for outpatient hemodialysis center before discharge. Patient is clinically stable. Continue current medical and supportive care.  #Symptomatic anemia due to anemia of chronic disease:  -Fecal occult blood test negative. Patient received red blood cell transfusion. -ESA and IV iron during dialysis as per nephrology. -Patient is clinically feeling better. -Monitor CBC.[name]  #Hypertensive urgency on admission: Continue Norvasc, hydralazine, metoprolol. Monitor blood pressure closely. Blood pressure improving.  Continue current supportive care. PT OT evaluation while in the hospital.  DVT prophylaxis: SCD and ambulation. No anticoagulant because of anemia Code Status: Full code Family Communication: No family at bedside Disposition Plan: Discharge home with outpatient hemodialysis center set up.   Consultants:   Nephrology  Vascular surgery  Procedures: Tunneled dialysis catheter AV fistula creation  Antimicrobials: None Subjective: Seen and examined at bedside. Denied headache, dizziness, nausea vomiting chest pain shortness of breath.  Objective: Vitals:   05/29/17 1602 05/29/17 1848 05/29/17 2129 05/30/17 0600  BP: (!) 153/63 (!) 157/70 (!) 142/75     Pulse:  76 85 78  Resp:  18 18 18   Temp:  98.1 F (36.7 C) 98.1 F (36.7 C) 98.5 F (36.9 C)  TempSrc:  Oral Oral Oral  SpO2:  97% 95% 97%  Weight:   55.7 kg (122 lb 11.9 oz)   Height:        Intake/Output Summary (Last 24 hours) at 05/30/17 1159 Last data filed at 05/30/17 1000  Gross per 24 hour  Intake              720 ml  Output                0 ml  Net              720 ml   Filed Weights   05/29/17 0825 05/29/17 1155 05/29/17 2129  Weight: 56.7 kg (125 lb) 55.7 kg (122 lb 12.7 oz) 55.7 kg (122 lb 11.9 oz)    Examination:  General exam: Lying on bed comfortable, not in distress Respiratory system: Clear bilaterally, respiratory effort normal Cardiovascular system: Regular rate rhythm, S1 and S2 normal. No pedal edema. Gastrointestinal system: Abdomen is nondistended, soft and nontender. Normal bowel sounds heard. Central nervous system: Alert and oriented. No focal neurological deficits. Skin: No rashes, lesions or ulcers AV fistula site with no bleeding.   Data Reviewed: I have personally reviewed following labs and imaging studies  CBC:  Recent Labs Lab 05/25/17 1546 05/26/17 0943 05/27/17 1224 05/28/17 0545 05/29/17 0315  WBC 8.9 5.4 4.8 5.2 5.5  HGB 7.5* 7.2* 6.8* 7.6* 7.7*  HCT 22.7* 20.7* 20.1* 22.0* 23.1*  MCV 82.8 81.2 82.4 84.6 86.2  PLT 213 164 155 148* 528*   Basic Metabolic Panel:  Recent Labs Lab 05/25/17 1546 05/26/17 0944 05/27/17 1223 05/28/17 0545 05/29/17 0315  NA 132* 135 130* 135 133*  K 5.7* 3.6 3.0* 3.2* 3.4*  CL 100* 99* 95* 100* 99*  CO2 <7* 17* 26 25 24   GLUCOSE 101* 90 134* 127* 109*  BUN 227* 113* 52* 27* 49*  CREATININE 15.74* 9.64* 6.22* 3.96* 5.74*  CALCIUM 9.0 8.5* 8.6* 8.3* 8.4*  PHOS 4.7* 10.5* 5.8*  --   --    GFR: Estimated Creatinine Clearance: 11.1 mL/min (A) (by C-G formula based on SCr of 5.74 mg/dL (H)). Liver Function Tests:  Recent Labs Lab 05/24/17 1720 05/25/17 1546 05/26/17 0944  05/27/17 1223  AST 6*  --   --   --   ALT 11*  --   --   --   ALKPHOS 65  --   --   --   BILITOT 0.5  --   --   --   PROT 5.9*  --   --   --   ALBUMIN 2.7* 2.6* 2.5* 2.3*    Recent Labs Lab 05/24/17 1720  LIPASE 44   No results for input(s): AMMONIA in the last 168 hours. Coagulation Profile:  Recent Labs Lab 05/25/17 0859 05/28/17 0545  INR 1.22 1.06   Cardiac Enzymes: No results for input(s): CKTOTAL, CKMB, CKMBINDEX, TROPONINI in the last 168 hours. BNP (last 3 results) No results for input(s): PROBNP in the last 8760 hours. HbA1C: No results for input(s): HGBA1C in the last 72 hours. CBG:  Recent Labs Lab 05/26/17 0435 05/26/17 0807 05/27/17 0743 05/28/17 0503 05/30/17 0802  GLUCAP 83 94 112* 126* 107*   Lipid Profile: No results for input(s): CHOL, HDL, LDLCALC, TRIG, CHOLHDL, LDLDIRECT in the last 72 hours. Thyroid Function Tests: No results for input(s): TSH, T4TOTAL, FREET4, T3FREE, THYROIDAB in the last 72 hours. Anemia Panel: No results for input(s): VITAMINB12, FOLATE, FERRITIN, TIBC, IRON, RETICCTPCT in the last 72 hours. Sepsis Labs: No results for input(s): PROCALCITON, LATICACIDVEN in the last 168 hours.  Recent Results (from the past 240 hour(s))  Surgical pcr screen     Status: Abnormal   Collection Time: 05/27/17  8:27 PM  Result Value Ref Range Status   MRSA, PCR NEGATIVE NEGATIVE Final   Staphylococcus aureus POSITIVE (A) NEGATIVE Final    Comment: (NOTE) The Xpert SA Assay (FDA approved for NASAL specimens in patients 13 years of age and older), is one component of a comprehensive surveillance program. It is not intended to diagnose infection nor to guide or monitor treatment.          Radiology Studies: No results found.      Scheduled Meds: . amLODipine  10 mg Oral Daily  . atorvastatin  10 mg Oral q1800  . Chlorhexidine Gluconate Cloth  6 each Topical Daily  . darbepoetin (ARANESP) injection - DIALYSIS  40 mcg  Intravenous Q Mon-HD  . feeding supplement (NEPRO CARB STEADY)  237 mL Oral BID BM  . hydrALAZINE  50 mg Oral TID  . metoprolol tartrate  75 mg Oral BID  . multivitamin  1 tablet Oral QHS  . mupirocin ointment  1 application Nasal BID  . pantoprazole  40 mg Oral Daily  . polyethylene glycol  17 g Oral Daily  . senna-docusate  1 tablet Oral BID  . sucroferric oxyhydroxide  500 mg Oral TID WC   Continuous Infusions:   LOS: 6 days    Woodroe Vogan Tanna Furry, MD [facility] Pager [phone]  If 7PM-7AM, please contact night-coverage [email or web address] Password [email or web address] 05/30/2017, 11:59 AM

## 2017-05-30 NOTE — Progress Notes (Signed)
Physical Therapy Treatment Patient Details Name: Thomas Robinson MRN: [id number] DOB: 1961/08/25 Today's Date: 05/30/2017    History of Present Illness Pt is a 56 y.o. male with medical history significant for chronic kidney disease stage V, hypertension, hyperlipidemia, and history of CVA.  He presented to the emergency department for evaluation of abdominal pain with nausea, vomiting, loose stools, intermittent melena, anorexia, and fatigue.  Pt admitted with AKI and is now undergoing HD.    PT Comments    Pt progressing towards goals. Continues to be unsteady with gait and required min guard for steadying assist. Performed standing exercises this session and instructed to perform at home for strengthening. Current recommendations appropriate. Will continue to follow acutely to maximize functional mobility independence and safety.    Follow Up Recommendations  No PT follow up;Supervision - Intermittent     Equipment Recommendations  None recommended by PT    Recommendations for Other Services       Precautions / Restrictions Precautions Precautions: Fall Restrictions Weight Bearing Restrictions: No    Mobility  Bed Mobility Overal bed mobility: Modified Independent                Transfers Overall transfer level: Needs assistance Equipment used: None Transfers: Sit to/from Stand Sit to Stand: Supervision         General transfer comment: Supervision for safety.   Ambulation/Gait Ambulation/Gait assistance: Min guard Ambulation Distance (Feet): 200 Feet Assistive device: None Gait Pattern/deviations: Step-through pattern;Decreased stride length Gait velocity: decreased Gait velocity interpretation: Below normal speed for age/gender General Gait Details: Slow, unsteady gait. Required min guard for steadying assist.    Stairs            Wheelchair Mobility    Modified Rankin (Stroke Patients Only)       Balance Overall balance assessment:  Needs assistance Sitting-balance support: No upper extremity supported;Feet supported Sitting balance-Leahy Scale: Good     Standing balance support: No upper extremity supported;During functional activity Standing balance-Leahy Scale: Fair Standing balance comment: static standing                             Cognition Arousal/Alertness: Awake/alert Behavior During Therapy: WFL for tasks assessed/performed Overall Cognitive Status: Within Functional Limits for tasks assessed                                        Exercises General Exercises - Lower Extremity Hip ABduction/ADduction: AROM;10 reps;Both;Standing (with UE support ) Hip Flexion/Marching: AROM;Both;10 reps;Standing (with UE wupport ) Heel Raises: AROM;Both;10 reps;Standing (with UE support ) Mini-Sqauts: AROM;Both;10 reps;Standing (with UE support )    General Comments        Pertinent Vitals/Pain Pain Assessment: No/denies pain    Home Living                      Prior Function            PT Goals (current goals can now be found in the care plan section) Acute Rehab PT Goals Patient Stated Goal: home PT Goal Formulation: With patient Time For Goal Achievement: 06/12/17 Potential to Achieve Goals: Good Progress towards PT goals: Progressing toward goals    Frequency    Min 3X/week      PT Plan Current plan remains appropriate    Co-evaluation  AM-PAC PT "6 Clicks" Daily Activity  Outcome Measure  Difficulty turning over in bed (including adjusting bedclothes, sheets and blankets)?: None Difficulty moving from lying on back to sitting on the side of the bed? : None Difficulty sitting down on and standing up from a chair with arms (e.g., wheelchair, bedside commode, etc,.)?: A Little Help needed moving to and from a bed to chair (including a wheelchair)?: A Little Help needed walking in hospital room?: A Little Help needed climbing 3-5  steps with a railing? : A Little 6 Click Score: 20    End of Session Equipment Utilized During Treatment: Gait belt Activity Tolerance: Patient tolerated treatment well Patient left: in bed;with call bell/phone within reach Nurse Communication: Mobility status PT Visit Diagnosis: Unsteadiness on feet (R26.81);Muscle weakness (generalized) (M62.81)     Time: 1025-8527 PT Time Calculation (min) (ACUTE ONLY): 16 min  Charges:  $Gait Training: 8-22 mins                    G Codes:       Leighton Ruff, PT, DPT  Acute Rehabilitation Services  Pager: [phone]    Rudean Hitt 05/30/2017, 9:46 AM

## 2017-05-30 NOTE — Evaluation (Signed)
Occupational Therapy Evaluation Patient Details Name: Thomas Robinson MRN: [id number] DOB: 1961/07/24 Today's Date: 05/30/2017    History of Present Illness Pt is a 56 y.o. male with medical history significant for chronic kidney disease stage V, hypertension, hyperlipidemia, and history of CVA.  He presented to the emergency department for evaluation of abdominal pain with nausea, vomiting, loose stools, intermittent melena, anorexia, and fatigue.  Pt admitted with AKI and is now undergoing HD.   Clinical Impression   PTA, pt was independent with ADL and functional mobility and was living alone. He currently requires supervision for standing ADL and toilet transfers due to generalized weakness and decreased activity tolerance for ADL. Pt would benefit from continued OT services while admitted to improve independence and safety with ADL participation prior to D/C home. Next session to focus on improved activity tolerance for ADL and tub transfers.     Follow Up Recommendations  No OT follow up    Equipment Recommendations  Tub/shower seat    Recommendations for Other Services       Precautions / Restrictions Precautions Precautions: Fall Restrictions Weight Bearing Restrictions: No      Mobility Bed Mobility Overal bed mobility: Modified Independent                Transfers Overall transfer level: Needs assistance Equipment used: None Transfers: Sit to/from Stand Sit to Stand: Supervision         General transfer comment: Supervision for safety.     Balance Overall balance assessment: Needs assistance Sitting-balance support: No upper extremity supported;Feet supported Sitting balance-Leahy Scale: Good     Standing balance support: No upper extremity supported;During functional activity Standing balance-Leahy Scale: Fair Standing balance comment: Unsteady with dynamic tasks.                            ADL either performed or assessed with  clinical judgement   ADL Overall ADL's : Needs assistance/impaired Eating/Feeding: Set up;Sitting   Grooming: Supervision/safety;Standing   Upper Body Bathing: Set up;Sitting   Lower Body Bathing: Supervison/ safety;Sit to/from stand   Upper Body Dressing : Set up;Sitting   Lower Body Dressing: Supervision/safety;Sit to/from stand   Toilet Transfer: Supervision/safety;Ambulation   Toileting- Clothing Manipulation and Hygiene: Supervision/safety;Sit to/from stand   Tub/ Shower Transfer: Supervision/safety;Ambulation   Functional mobility during ADLs: Supervision/safety General ADL Comments: Supervision for safety. Pt with decreased activity tolerance for ADL.      Vision         Perception     Praxis Praxis Praxis tested?: Within functional limits    Pertinent Vitals/Pain Pain Assessment: No/denies pain     Hand Dominance Right   Extremity/Trunk Assessment Upper Extremity Assessment Upper Extremity Assessment: Generalized weakness;LUE deficits/detail LUE Deficits / Details: s/p fistula placement and limited AROM   Lower Extremity Assessment Lower Extremity Assessment: Generalized weakness   Cervical / Trunk Assessment Cervical / Trunk Assessment: Normal   Communication Communication Communication: No difficulties   Cognition Arousal/Alertness: Awake/alert Behavior During Therapy: WFL for tasks assessed/performed Overall Cognitive Status: Within Functional Limits for tasks assessed                                     General Comments       Exercises Exercises: General Lower Extremity General Exercises - Lower Extremity Hip ABduction/ADduction: AROM;10 reps;Both;Standing (with UE support ) Hip Flexion/Marching:  AROM;Both;10 reps;Standing (with UE wupport ) Heel Raises: AROM;Both;10 reps;Standing (with UE support ) Mini-Sqauts: AROM;Both;10 reps;Standing (with UE support )   Shoulder Instructions      Home Living Family/patient  expects to be discharged to:: Private residence Living Arrangements: Non-relatives/Friends Available Help at Discharge: Friend(s);Available 24 hours/day Type of Home: House Home Access: Stairs to enter [employer] of Steps: 1 Entrance Stairs-Rails: None Home Layout: One level     Bathroom Shower/Tub: Tub/shower unit;Door   [employer] Toilet: Standard     Home Equipment: None          Prior Functioning/Environment Level of Independence: Independent        Comments: Enjoys walking his dog.        OT Problem List: Decreased activity tolerance;Impaired balance (sitting and/or standing);Decreased strength;Decreased safety awareness;Decreased knowledge of use of DME or AE;Decreased knowledge of precautions;Pain      OT Treatment/Interventions: Self-care/ADL training;Therapeutic exercise;Energy conservation;DME and/or AE instruction;Therapeutic activities;Patient/family education;Balance training    OT Goals(Current goals can be found in the care plan section) Acute Rehab OT Goals Patient Stated Goal: to go home Monday OT Goal Formulation: With patient Time For Goal Achievement: 06/13/17 Potential to Achieve Goals: Good ADL Goals Pt Will Perform Grooming: Independently;standing Pt Will Perform Lower Body Dressing: Independently;sit to/from stand Pt Will Transfer to Toilet: Independently;ambulating;regular height toilet Pt Will Perform Toileting - Clothing Manipulation and hygiene: Independently;sit to/from stand Pt Will Perform Tub/Shower Transfer: Independently;ambulating;shower seat;rolling walker  OT Frequency: Min 2X/week   Barriers to D/C:            Co-evaluation              AM-PAC PT "6 Clicks" Daily Activity     Outcome Measure Help from another person eating meals?: A Little Help from another person taking care of personal grooming?: A Little Help from another person toileting, which includes using toliet, bedpan, or urinal?: A Little Help  from another person bathing (including washing, rinsing, drying)?: A Little Help from another person to put on and taking off regular upper body clothing?: A Little Help from another person to put on and taking off regular lower body clothing?: A Little 6 Click Score: 18   End of Session    Activity Tolerance: Patient tolerated treatment well Patient left: in bed;with call bell/phone within reach  OT Visit Diagnosis: Unsteadiness on feet (R26.81);Muscle weakness (generalized) (M62.81)                Time: 3546-5681 OT Time Calculation (min): 9 min Charges:  OT General Charges $OT Visit: 1 Visit OT Evaluation $OT Eval Low Complexity: 1 Low G-Codes:     Thomas Herrlich, MS OTR/L  Pager: [place] A Thomas Robinson 05/30/2017, 12:34 PM

## 2017-05-31 LAB — GLUCOSE, CAPILLARY: GLUCOSE-CAPILLARY: 108 mg/dL — AB (ref 65–99)

## 2017-05-31 MED ORDER — BISACODYL 10 MG RE SUPP
10.0000 mg | Freq: Every day | RECTAL | Status: DC | PRN
  Filled 2017-05-31: qty 1

## 2017-05-31 NOTE — Progress Notes (Signed)
PROGRESS NOTE    Thomas Robinson  RCV:[id number] DOB: 06/13/1961 DOA: 05/24/2017 PCP: Patient, No Pcp Per   Brief Narrative: 56 year old gentleman with history of chronic kidney disease status 5, hypertension, hyperlipidemia, history of a stroke presenting to the ER with nausea vomiting or abdominal pain, anorexia and generalized fatigue. He was found to have BUN of 20-25, creatinine of 15.22 with hyperkalemia. Hemodialysis catheter was placed by IR on September 24 and started on hemodialysis.   Assessment & Plan:   # Newly diagnosed ESRD:  -Receiving hemodialysis treatment by a tunneled catheter. Tolerating well. -Patient underwent left radiocephalic AV fistula placement on September 2017 by vascular surgery.  Still looking for outpatient hemodialysis center before discharge. Patient is clinically stable. Continue current medical and supportive care.  #Symptomatic anemia due to anemia of chronic disease:  -Fecal occult blood test negative. Patient received red blood cell transfusion. -ESA and IV iron during dialysis as per nephrology. -Patient is clinically feeling better. -Monitor labs during dialysis.  #Hypertensive urgency on admission: Continue Norvasc, hydralazine, metoprolol. Monitor blood pressure closely. Blood pressure improving.  # Constipation: Added enema and bowel regimen. Abdomen exam benign. Continue supportive care.  Continue current supportive care. PT OT evaluation while in the hospital.  DVT prophylaxis: SCD and ambulation. No anticoagulant because of anemia Code Status: Full code Family Communication: No family at bedside Disposition Plan: Discharge home with outpatient hemodialysis center set up.   Consultants:   Nephrology  Vascular surgery  Procedures: Tunneled dialysis catheter AV fistula creation  Antimicrobials: None Subjective: Seen and examined at bedside. Reports constipation. Denied headache, nausea vomiting chest pain or shortness of  breath or abdominal pain.  Objective: Vitals:   05/30/17 2131 05/31/17 0631 05/31/17 0955 05/31/17 1136  BP: (!) 139/102 (!) 143/72 (!) 145/51 (!) 144/55  Pulse: 80 76 83 72  Resp: 18 18 18    Temp: 98.7 F (37.1 C) 98 F (36.7 C) 98.1 F (36.7 C)   TempSrc: Oral Oral Oral   SpO2: 94% 94% 98%   Weight: 59.1 kg (130 lb 6.4 oz)     Height:        Intake/Output Summary (Last 24 hours) at 05/31/17 1233 Last data filed at 05/31/17 1003  Gross per 24 hour  Intake              960 ml  Output                0 ml  Net              960 ml   Filed Weights   05/29/17 1155 05/29/17 2129 05/30/17 2131  Weight: 55.7 kg (122 lb 12.7 oz) 55.7 kg (122 lb 11.9 oz) 59.1 kg (130 lb 6.4 oz)    Examination:  General exam: Not in distress Respiratory system: Clear bilateral, respiratory effort normal Cardiovascular system: Regular rate rhythm, S1 and S2 normal. No pedal edema. Gastrointestinal system: Abdomen soft, nontender, nondistended. Thousand positive. Central nervous system: Alert and oriented. No focal neurological deficits. Skin: No rashes, lesions or ulcers AV fistula site with no bleeding.   Data Reviewed: I have personally reviewed following labs and imaging studies  CBC:  Recent Labs Lab 05/25/17 1546 05/26/17 0943 05/27/17 1224 05/28/17 0545 05/29/17 0315  WBC 8.9 5.4 4.8 5.2 5.5  HGB 7.5* 7.2* 6.8* 7.6* 7.7*  HCT 22.7* 20.7* 20.1* 22.0* 23.1*  MCV 82.8 81.2 82.4 84.6 86.2  PLT 213 164 155 148* 102*   Basic Metabolic Panel:  Recent Labs Lab 05/25/17 1546 05/26/17 0944 05/27/17 1223 05/28/17 0545 05/29/17 0315  NA 132* 135 130* 135 133*  K 5.7* 3.6 3.0* 3.2* 3.4*  CL 100* 99* 95* 100* 99*  CO2 <7* 17* 26 25 24   GLUCOSE 101* 90 134* 127* 109*  BUN 227* 113* 52* 27* 49*  CREATININE 15.74* 9.64* 6.22* 3.96* 5.74*  CALCIUM 9.0 8.5* 8.6* 8.3* 8.4*  PHOS 4.7* 10.5* 5.8*  --   --    GFR: Estimated Creatinine Clearance: 11.7 mL/min (A) (by C-G formula based  on SCr of 5.74 mg/dL (H)). Liver Function Tests:  Recent Labs Lab 05/24/17 1720 05/25/17 1546 05/26/17 0944 05/27/17 1223  AST 6*  --   --   --   ALT 11*  --   --   --   ALKPHOS 65  --   --   --   BILITOT 0.5  --   --   --   PROT 5.9*  --   --   --   ALBUMIN 2.7* 2.6* 2.5* 2.3*    Recent Labs Lab 05/24/17 1720  LIPASE 44   No results for input(s): AMMONIA in the last 168 hours. Coagulation Profile:  Recent Labs Lab 05/25/17 0859 05/28/17 0545  INR 1.22 1.06   Cardiac Enzymes: No results for input(s): CKTOTAL, CKMB, CKMBINDEX, TROPONINI in the last 168 hours. BNP (last 3 results) No results for input(s): PROBNP in the last 8760 hours. HbA1C: No results for input(s): HGBA1C in the last 72 hours. CBG:  Recent Labs Lab 05/26/17 0807 05/27/17 0743 05/28/17 0503 05/30/17 0802 05/31/17 0754  GLUCAP 94 112* 126* 107* 108*   Lipid Profile: No results for input(s): CHOL, HDL, LDLCALC, TRIG, CHOLHDL, LDLDIRECT in the last 72 hours. Thyroid Function Tests: No results for input(s): TSH, T4TOTAL, FREET4, T3FREE, THYROIDAB in the last 72 hours. Anemia Panel: No results for input(s): VITAMINB12, FOLATE, FERRITIN, TIBC, IRON, RETICCTPCT in the last 72 hours. Sepsis Labs: No results for input(s): PROCALCITON, LATICACIDVEN in the last 168 hours.  Recent Results (from the past 240 hour(s))  Surgical pcr screen     Status: Abnormal   Collection Time: 05/27/17  8:27 PM  Result Value Ref Range Status   MRSA, PCR NEGATIVE NEGATIVE Final   Staphylococcus aureus POSITIVE (A) NEGATIVE Final    Comment: (NOTE) The Xpert SA Assay (FDA approved for NASAL specimens in patients 61 years of age and older), is one component of a comprehensive surveillance program. It is not intended to diagnose infection nor to guide or monitor treatment.          Radiology Studies: No results found.      Scheduled Meds: . amLODipine  10 mg Oral Daily  . atorvastatin  10 mg Oral  q1800  . Chlorhexidine Gluconate Cloth  6 each Topical Daily  . darbepoetin (ARANESP) injection - DIALYSIS  40 mcg Intravenous Q Mon-HD  . feeding supplement (NEPRO CARB STEADY)  237 mL Oral BID BM  . hydrALAZINE  50 mg Oral TID  . metoprolol tartrate  75 mg Oral BID  . multivitamin  1 tablet Oral QHS  . mupirocin ointment  1 application Nasal BID  . pantoprazole  40 mg Oral Daily  . polyethylene glycol  17 g Oral Daily  . senna-docusate  1 tablet Oral BID  . sucroferric oxyhydroxide  500 mg Oral TID WC   Continuous Infusions:   LOS: 7 days    Aasha Dina Tanna Furry, MD [facility] Pager [phone]  If 7PM-7AM, please contact night-coverage [email or web address] Password [facility] 05/31/2017, 12:33 PM

## 2017-05-31 NOTE — Progress Notes (Signed)
Patient ID: Thomas Robinson, male   DOB: Dec 27, 1960, 56 y.o.   MRN: [id number]  [facility] Progress Note    Subjective:    Seen lying in bed. Feels "100%" better than admission. Appetite improving, gaining weight. Tolerating HD, awaiting CLIP to [place].   Objective:   BP (!) 143/72 (BP Location: Right Arm)   Pulse 76   Temp 98 F (36.7 C) (Oral)   Resp 18   Ht 5\' 8"  (1.727 m)   Wt 59.1 kg (130 lb 6.4 oz)   SpO2 94%   BMI 19.83 kg/m   Intake/Output: I/O last 3 completed shifts: In: 960 [P.O.:960] Out: 0    Intake/Output this shift:  No intake/output data recorded. Weight change: 2.449 kg (5 lb 6.4 oz)  Physical Exam: Gen:NAD CVS: no rub Resp:cta ZCH:YIFOYD Ext: no edema, L avf +T/B  Labs: BMET  Recent Labs Lab 05/24/17 1720 05/25/17 0643 05/25/17 1546 05/26/17 0944 05/27/17 1223 05/28/17 0545 05/29/17 0315  NA 131* 134* 132* 135 130* 135 133*  K 5.2* 5.5* 5.7* 3.6 3.0* 3.2* 3.4*  CL 97* 101 100* 99* 95* 100* 99*  CO2 <7* 8* <7* 17* 26 25 24   GLUCOSE 119* 88 101* 90 134* 127* 109*  BUN 225* 222* 227* 113* 52* 27* 49*  CREATININE 15.72* 15.18* 15.74* 9.64* 6.22* 3.96* 5.74*  ALBUMIN 2.7*  --  2.6* 2.5* 2.3*  --   --   CALCIUM 8.9 8.8* 9.0 8.5* 8.6* 8.3* 8.4*  PHOS  --   --  4.7* 10.5* 5.8*  --   --    CBC  Recent Labs Lab 05/26/17 0943 05/27/17 1224 05/28/17 0545 05/29/17 0315  WBC 5.4 4.8 5.2 5.5  HGB 7.2* 6.8* 7.6* 7.7*  HCT 20.7* 20.1* 22.0* 23.1*  MCV 81.2 82.4 84.6 86.2  PLT 164 155 148* 149*    @IMGRELPRIORS @ Medications:    . amLODipine  10 mg Oral Daily  . atorvastatin  10 mg Oral q1800  . Chlorhexidine Gluconate Cloth  6 each Topical Daily  . darbepoetin (ARANESP) injection - DIALYSIS  40 mcg Intravenous Q Mon-HD  . feeding supplement (NEPRO CARB STEADY)  237 mL Oral BID BM  . hydrALAZINE  50 mg Oral TID  . metoprolol tartrate  75 mg Oral BID  . multivitamin  1 tablet Oral QHS  . mupirocin ointment  1 application  Nasal BID  . pantoprazole  40 mg Oral Daily  . polyethylene glycol  17 g Oral Daily  . senna-docusate  1 tablet Oral BID  . sucroferric oxyhydroxide  500 mg Oral TID WC     Assessment/ Plan:   1. New ESRD with significant uremic symptoms. s/p 3rd session 05/27/17 and now on IHD.  HIV Neg He B and C neg. Discussed with nursing educational videos he needs to watch. Discussed outpatient HD as well as PD.  1. Feels much better after serial dialysis 2. Plan for HD again on Monday and await outpatient schedule once set. 2. Anemia of CKD stage 5- s/p 2 units PRBC, now on ESA hgb 5.3 > 7.7 - ESA started 23% sat ferritin 109 folate- iron studies drawn before transfusion- 1. Continue with EPO and give partial course of Fe 3. SHPTH- hectoral with HD- need ipTH P 10.5 - start binder here but will need to figure out affordable option after discharge  1. On Velphoro 500 qac, renal diet and follow 4. HTN/volume- stable, on MTP 75 bid and norvasc 10 5. Vascular  access; pt is right handeds/p HD cath and L ciminoAVF placement 05/28/17 by Dr. Bridgett Larsson but with visible competing vessel. F/u with [facility] 6. Social- no health insurance, consider starting PD once uremia improved to get immediate medicare coverage. 7. PCM - weight loss of ~40# over time - used to weigh 160# alb - low- starting to eat today- add nepro  8. Hypokalemia K 3.4. Use 4K bath and follow trend, appetite improving   9. Disposition- will need to be set up for outpatient dialysis, CLIP process started.   Lynnda Child PA-C [facility] Pager [phone] 05/31/2017,8:29 AM  I have seen and examined this patient and agree with plan and assessment in the above note with renal recommendations/intervention highlighted.  Plan for HD tomorrow and awaiting outpatient dialysis schedule/location (dealing with lack of insurance as medicaid pending) [place] John A Cassia Fein,MD 05/31/2017 11:10 AM

## 2017-06-01 DIAGNOSIS — K5900 Constipation, unspecified: Secondary | ICD-10-CM

## 2017-06-01 LAB — RENAL FUNCTION PANEL
ANION GAP: 14 (ref 5–15)
Albumin: 2.2 g/dL — ABNORMAL LOW (ref 3.5–5.0)
BUN: 93 mg/dL — ABNORMAL HIGH (ref 6–20)
CHLORIDE: 97 mmol/L — AB (ref 101–111)
CO2: 22 mmol/L (ref 22–32)
Calcium: 8.6 mg/dL — ABNORMAL LOW (ref 8.9–10.3)
Creatinine, Ser: 8.03 mg/dL — ABNORMAL HIGH (ref 0.61–1.24)
GFR calc Af Amer: 8 mL/min — ABNORMAL LOW (ref >60)
GFR calc non Af Amer: 7 mL/min — ABNORMAL LOW (ref >60)
GLUCOSE: 94 mg/dL (ref 65–99)
POTASSIUM: 4 mmol/L (ref 3.5–5.1)
Phosphorus: 5.5 mg/dL — ABNORMAL HIGH (ref 2.5–4.6)
Sodium: 133 mmol/L — ABNORMAL LOW (ref 135–145)

## 2017-06-01 LAB — GLUCOSE, CAPILLARY: Glucose-Capillary: 113 mg/dL — ABNORMAL HIGH (ref 65–99)

## 2017-06-01 LAB — CBC
HEMATOCRIT: 20.2 % — AB (ref 39.0–52.0)
HEMOGLOBIN: 6.6 g/dL — AB (ref 13.0–17.0)
MCH: 28.6 pg (ref 26.0–34.0)
MCHC: 32.7 g/dL (ref 30.0–36.0)
MCV: 87.4 fL (ref 78.0–100.0)
Platelets: 187 10*3/uL (ref 150–400)
RBC: 2.31 MIL/uL — ABNORMAL LOW (ref 4.22–5.81)
RDW: 14.7 % (ref 11.5–15.5)
WBC: 6.1 10*3/uL (ref 4.0–10.5)

## 2017-06-01 LAB — PREPARE RBC (CROSSMATCH)

## 2017-06-01 MED ORDER — DARBEPOETIN ALFA 40 MCG/0.4ML IJ SOSY
PREFILLED_SYRINGE | INTRAMUSCULAR | Status: AC
  Administered 2017-06-01: 40 ug via INTRAVENOUS
  Filled 2017-06-01: qty 0.4

## 2017-06-01 MED ORDER — SODIUM CHLORIDE 0.9 % IV SOLN: 100.0000 mL | INTRAVENOUS | Status: DC | PRN

## 2017-06-01 MED ORDER — PENTAFLUOROPROP-TETRAFLUOROETH EX AERO: 1.0000 "application " | INHALATION_SPRAY | CUTANEOUS | Status: DC | PRN

## 2017-06-01 MED ORDER — SODIUM CHLORIDE 0.9 % IV SOLN
125.0000 mg | INTRAVENOUS | Status: DC
  Administered 2017-06-01: 125 mg via INTRAVENOUS
  Filled 2017-06-01: qty 10

## 2017-06-01 MED ORDER — SODIUM CHLORIDE 0.9 % IV SOLN: Freq: Once | INTRAVENOUS | Status: DC

## 2017-06-01 MED ORDER — ALTEPLASE 2 MG IJ SOLR: 2.0000 mg | Freq: Once | INTRAMUSCULAR | Status: DC | PRN

## 2017-06-01 MED ORDER — LIDOCAINE HCL (PF) 1 % IJ SOLN: 5.0000 mL | INTRAMUSCULAR | Status: DC | PRN

## 2017-06-01 MED ORDER — LIDOCAINE-PRILOCAINE 2.5-2.5 % EX CREA: 1.0000 "application " | TOPICAL_CREAM | CUTANEOUS | Status: DC | PRN

## 2017-06-01 MED ORDER — HEPARIN SODIUM (PORCINE) 1000 UNIT/ML DIALYSIS: 1000.0000 [IU] | INTRAMUSCULAR | Status: DC | PRN

## 2017-06-01 NOTE — Progress Notes (Signed)
Physical Therapy Treatment Patient Details Name: Thomas Robinson MRN: [id number] DOB: 26-Dec-1960 Today's Date: 06/01/2017    History of Present Illness Pt is a 56 y.o. male with medical history significant for chronic kidney disease stage V, hypertension, hyperlipidemia, and history of CVA.  He presented to the emergency department for evaluation of abdominal pain with nausea, vomiting, loose stools, intermittent melena, anorexia, and fatigue.  Pt admitted with AKI and is now undergoing HD.    PT Comments    Continuing work on functional mobility and activity tolerance;  Walked farther distance and went up and down stairs; Small losses of balance during session from which he recovered without physical assist; Feel he would benefit from using a cane for amb, however he flat refuses   Follow Up Recommendations  No PT follow up;Supervision - Intermittent     Equipment Recommendations  Kasandra Knudsen (He will refuse a cane)    Recommendations for Other Services       Precautions / Restrictions Precautions Precautions: Fall    Mobility  Bed Mobility                  Transfers Overall transfer level: Needs assistance Equipment used: None Transfers: Sit to/from Stand Sit to Stand: Supervision         General transfer comment: Supervision for safety.   Ambulation/Gait Ambulation/Gait assistance: Supervision Ambulation Distance (Feet): 200 Feet Assistive device: None Gait Pattern/deviations: Step-through pattern;Decreased stride length     General Gait Details: Noted some gait assymetry including stiffer LLE with decr L knee flexion in swing; a few small losses of balance from which he recovered without physical assist; offered a cane, he adamantly declined   Stairs Stairs: Yes   Stair Management: One rail Right;Alternating pattern;Forwards Number of Stairs: 4 General stair comments: Closeguard for safety, but did not need physical assist  Wheelchair Mobility     Modified Rankin (Stroke Patients Only)       Balance     Sitting balance-Leahy Scale: Good       Standing balance-Leahy Scale: Fair Standing balance comment: Unsteady with dynamic tasks.                             Cognition Arousal/Alertness: Awake/alert Behavior During Therapy: WFL for tasks assessed/performed Overall Cognitive Status: Within Functional Limits for tasks assessed                                        Exercises      General Comments        Pertinent Vitals/Pain Pain Assessment: No/denies pain    Home Living                      Prior Function            PT Goals (current goals can now be found in the care plan section) Acute Rehab PT Goals Patient Stated Goal: to go home PT Goal Formulation: With patient Time For Goal Achievement: 06/12/17 Potential to Achieve Goals: Good Progress towards PT goals: Progressing toward goals    Frequency    Min 3X/week      PT Plan Current plan remains appropriate    Co-evaluation              AM-PAC PT "6 Clicks" Daily Activity  Outcome Measure  Difficulty turning over in bed (including adjusting bedclothes, sheets and blankets)?: None Difficulty moving from lying on back to sitting on the side of the bed? : None Difficulty sitting down on and standing up from a chair with arms (e.g., wheelchair, bedside commode, etc,.)?: None Help needed moving to and from a bed to chair (including a wheelchair)?: None Help needed walking in hospital room?: None Help needed climbing 3-5 steps with a railing? : A Little 6 Click Score: 23    End of Session Equipment Utilized During Treatment: Gait belt Activity Tolerance: Patient tolerated treatment well Patient left: in chair;with call bell/phone within reach Nurse Communication: Mobility status PT Visit Diagnosis: Unsteadiness on feet (R26.81);Muscle weakness (generalized) (M62.81)     Time: 4383-7793 PT Time  Calculation (min) (ACUTE ONLY): 16 min  Charges:  $Gait Training: 8-22 mins                    G Codes:       Roney Marion, [place] Pager [phone] Office [place] 06/01/2017, 1:15 PM

## 2017-06-01 NOTE — Progress Notes (Signed)
Nephrologist made aware of Hgb 6.6 and ordered 2 Units PRBC. Pt is alert and responsive and no s/s of active bleeding at this time

## 2017-06-01 NOTE — Progress Notes (Signed)
Thomas Robinson Progress Note   Dialysis Orders:  New to HD, started as inpatient.  Assessment/Plan: 1. New ESRD with significant uremic symptoms.   -Symptoms greatly improved.   -s/p 4th IHD on 9/28. Continue on MWF schedule while inpatient.   -HIV, Hep B and C Neg. Has been provided with educational videos and information about different dialysis options. 2. Anemia   -Hgb trending up 6.8>7.6>7.7  -s/p 3 units PRBC on 9/23, 9/24 and 9/26. ESA started 9/24 - 6mcg qwk (Mon)   -iron studies: 23% sat ferritin 109 folate - drawn prior to transfusion 3. SHPTH- hectoral with HD- need ipTH   -Ca within goal  -P improved 10.5>5.8 cont Velphoro 500 qac, needs affordable option at d/c 4. HTN/volume- stable, on MTP 75 bid and norvasc 10.  Titrate down volume with HD.  Will need to determine appropriate EDW at D/c. 5. Vascular access   - [facility] cath placed on 9/24 - IR  - L radiocephaic AVF placed, ligation of side branch. Per [facility], Dr. Bridgett Larsson 9/27, outpatient follow up.  6. Social- no health insurance, SW working on coverage for dialysis. 7. PCM - weight loss of ~40# over time - used to weigh 160#, appetite improved. Last alb 2.3, Nepro TID.   8. Hypokalemia, trending up K 3.0>3.2>3.4.  Use 4K bath, recheck RFP prior to HD. 9. Disposition- working on setting up outpatient dialysis,  CLIP process started for [facility].  Jen Mow, PA-C [facility] 06/01/2017,9:45 AM  LOS: 8 days   Pt seen, examined and agree w A/P as above.  [name] MD [employer] pager [phone]   06/01/2017, 4:19 PM    Subjective:   Patient is laying in bed, feels well, ready to go home. States appetitive returned.  Denies N/V/D, SOB, CP, edema, and weakness.   Objective Vitals:   05/31/17 1900 05/31/17 2114 05/31/17 2148 06/01/17 0507  BP: 140/64  (!) 145/66 (!) 148/72  Pulse: 77   72  Resp: 18   17  Temp: 98.3 F (36.8 C)   98.1 F (36.7 C)  TempSrc:    Oral   SpO2: 95% (!) 82%  97%  Weight: 59.3 kg (130 lb 11.7 oz)     Height:       Physical Exam General: NAD, WDWN Heart:RRR Lungs:CTA b/l Abdomen:Soft, nontender Extremities:2+ pitting edema b/l Dialysis Access: TDC, L AVF maturing  [facility] Weights   05/29/17 2129 05/30/17 2131 05/31/17 1900  Weight: 55.7 kg (122 lb 11.9 oz) 59.1 kg (130 lb 6.4 oz) 59.3 kg (130 lb 11.7 oz)    Intake/Output Summary (Last 24 hours) at 06/01/17 0945 Last data filed at 06/01/17 0600  Gross per 24 hour  Intake              600 ml  Output                0 ml  Net              600 ml    Additional Objective Labs: Basic Metabolic Panel:  Recent Labs Lab 05/25/17 1546 05/26/17 0944 05/27/17 1223 05/28/17 0545 05/29/17 0315  NA 132* 135 130* 135 133*  K 5.7* 3.6 3.0* 3.2* 3.4*  CL 100* 99* 95* 100* 99*  CO2 <7* 17* 26 25 24   GLUCOSE 101* 90 134* 127* 109*  BUN 227* 113* 52* 27* 49*  CREATININE 15.74* 9.64* 6.22* 3.96* 5.74*  CALCIUM 9.0 8.5* 8.6* 8.3* 8.4*  PHOS 4.7*  10.5* 5.8*  --   --    Liver Function Tests:  Recent Labs Lab 05/25/17 1546 05/26/17 0944 05/27/17 1223  ALBUMIN 2.6* 2.5* 2.3*   No results for input(s): LIPASE, AMYLASE in the last 168 hours. CBC:  Recent Labs Lab 05/25/17 1546 05/26/17 0943 05/27/17 1224 05/28/17 0545 05/29/17 0315  WBC 8.9 5.4 4.8 5.2 5.5  HGB 7.5* 7.2* 6.8* 7.6* 7.7*  HCT 22.7* 20.7* 20.1* 22.0* 23.1*  MCV 82.8 81.2 82.4 84.6 86.2  PLT 213 164 155 148* 149*   CBG:  Recent Labs Lab 05/27/17 0743 05/28/17 0503 05/30/17 0802 05/31/17 0754 06/01/17 0730  GLUCAP 112* 126* 107* 108* 113*   Iron Studies: No results for input(s): IRON, TIBC, TRANSFERRIN, FERRITIN in the last 72 hours. Lab Results  Component Value Date   INR 1.06 05/28/2017   INR 1.22 05/25/2017   INR 1.16 04/10/2015   Studies/Results:  Medications:  . amLODipine  10 mg Oral Daily  . atorvastatin  10 mg Oral q1800  . Chlorhexidine Gluconate Cloth  6 each Topical  Daily  . darbepoetin (ARANESP) injection - DIALYSIS  40 mcg Intravenous Q Mon-HD  . feeding supplement (NEPRO CARB STEADY)  237 mL Oral BID BM  . hydrALAZINE  50 mg Oral TID  . metoprolol tartrate  75 mg Oral BID  . multivitamin  1 tablet Oral QHS  . mupirocin ointment  1 application Nasal BID  . pantoprazole  40 mg Oral Daily  . polyethylene glycol  17 g Oral Daily  . senna-docusate  1 tablet Oral BID  . sucroferric oxyhydroxide  500 mg Oral TID WC

## 2017-06-01 NOTE — Progress Notes (Signed)
Occupational Therapy Treatment Patient Details Name: Thomas Robinson MRN: [id number] DOB: March 23, 1961 Today's Date: 06/01/2017    History of present illness Pt is a 56 y.o. male with medical history significant for chronic kidney disease stage V, hypertension, hyperlipidemia, and history of CVA.  He presented to the emergency department for evaluation of abdominal pain with nausea, vomiting, loose stools, intermittent melena, anorexia, and fatigue.  Pt admitted with AKI and is now undergoing HD.   OT comments  Pt supine in bed upon entering the room but agreeable to OT intervention. Pt engaged in standing dynamic ADL tasks without use of AD with overall supervision for safety. Education on fall risk with shower and practiced simulated shower threshold. Pt reports no further concerns for home safety. Education for energy conservation provided as well. Pt verbalized understanding and returning to bed at end of session. Call bell and all needed items within reach. Pt continues to benefit from OT intervention to address functional deficits.    Follow Up Recommendations  No OT follow up    Equipment Recommendations  Tub/shower seat    Recommendations for Other Services      Precautions / Restrictions Precautions Precautions: Fall Restrictions Weight Bearing Restrictions: No       Mobility Bed Mobility Overal bed mobility: Modified Independent       Transfers Overall transfer level: Needs assistance Equipment used: None Transfers: Sit to/from Stand Sit to Stand: Supervision Stand pivot transfers: Supervision       General transfer comment: Supervision for safety.     Balance Overall balance assessment: Needs assistance Sitting-balance support: No upper extremity supported;Feet supported Sitting balance-Leahy Scale: Good     Standing balance support: No upper extremity supported;During functional activity Standing balance-Leahy Scale: Fair Standing balance comment:  Unsteady with dynamic tasks.         ADL either performed or assessed with clinical judgement   ADL       General ADL Comments: Supervision for safety. Pt with decreased activity tolerance for ADL.      Vision Baseline Vision/History: No visual deficits            Cognition Arousal/Alertness: Awake/alert Behavior During Therapy: WFL for tasks assessed/performed Overall Cognitive Status: Within Functional Limits for tasks assessed                     Pertinent Vitals/ Pain       Pain Assessment: No/denies pain         Frequency  Min 2X/week        Progress Toward Goals  OT Goals(current goals can now be found in the care plan section)  Progress towards OT goals: Progressing toward goals  Acute Rehab OT Goals Patient Stated Goal: to go home OT Goal Formulation: With patient Time For Goal Achievement: 06/15/17 Potential to Achieve Goals: Good  Plan Discharge plan remains appropriate       AM-PAC PT "6 Clicks" Daily Activity     Outcome Measure   Help from another person eating meals?: None Help from another person taking care of personal grooming?: A Little Help from another person toileting, which includes using toliet, bedpan, or urinal?: A Little Help from another person bathing (including washing, rinsing, drying)?: A Little Help from another person to put on and taking off regular upper body clothing?: None Help from another person to put on and taking off regular lower body clothing?: A Little 6 Click Score: 20    End of Session  OT Visit Diagnosis: Unsteadiness on feet (R26.81);Muscle weakness (generalized) (M62.81)   Activity Tolerance Patient tolerated treatment well   Patient Left in bed;with call bell/phone within reach   Nurse Communication          Time: 4707-6151 OT Time Calculation (min): 16 min  Charges: OT General Charges $OT Visit: 1 Visit OT Treatments $Self Care/Home Management : 8-22 mins    Ryett Hamman  P, MS, OTR/L, CBIS 06/01/2017, 9:11 AM

## 2017-06-01 NOTE — Progress Notes (Signed)
PROGRESS NOTE    Cleo Villamizar  SWF:[id number] DOB: 1961-05-20 DOA: 05/24/2017 PCP: Patient, No Pcp Per   Brief Narrative: 56 year old gentleman with history of chronic kidney disease status 5, hypertension, hyperlipidemia, history of a stroke presenting to the ER with nausea vomiting or abdominal pain, anorexia and generalized fatigue. He was found to have BUN of 20-25, creatinine of 15.22 with hyperkalemia. Hemodialysis catheter was placed by IR on September 24 and started on hemodialysis.   Patient is now waiting for outpatient hemodialysis center arrangement.  Assessment & Plan:   # Newly diagnosed ESRD:  -Receiving hemodialysis treatment by a tunneled catheter. Tolerating well. -Patient underwent left radiocephalic AV fistula placement on September 2017 by vascular surgery.  Still looking for outpatient hemodialysis center before discharge. Patient is clinically stable. Continue current medical and supportive care.  #Symptomatic anemia due to anemia of chronic disease:  -Fecal occult blood test negative. Patient received red blood cell transfusion. -ESA and IV iron during dialysis as per nephrology. -Patient is clinically feeling better. -Monitor labs during dialysis.  #Hypertensive urgency on admission: Continue Norvasc, hydralazine, metoprolol.  Blood pressure improving. Continue to monitor  # Constipation: Had a bowel movement.  Continue current supportive care. PT OT evaluation while in the hospital. Discussed with the case management and dialysis nurse regarding outpatient hemodialysis arrangement before discharge.   DVT prophylaxis: SCD and ambulation. No anticoagulant because of anemia Code Status: Full code Family Communication: No family at bedside Disposition Plan: Discharge home with outpatient hemodialysis center set up.   Consultants:   Nephrology  Vascular surgery  Procedures: Tunneled dialysis catheter AV fistula  creation  Antimicrobials: None Subjective: Seen and examined at bedside. Denied headache, dizziness, nausea vomiting chest pressure shortness of breath. No abdominal pain. Had a bowel movement. Objective: Vitals:   05/31/17 1900 05/31/17 2114 05/31/17 2148 06/01/17 0507  BP: 140/64  (!) 145/66 (!) 148/72  Pulse: 77   72  Resp: 18   17  Temp: 98.3 F (36.8 C)   98.1 F (36.7 C)  TempSrc:    Oral  SpO2: 95% (!) 82%  97%  Weight: 59.3 kg (130 lb 11.7 oz)     Height:        Intake/Output Summary (Last 24 hours) at 06/01/17 1321 Last data filed at 06/01/17 0600  Gross per 24 hour  Intake              240 ml  Output                0 ml  Net              240 ml   Filed Weights   05/29/17 2129 05/30/17 2131 05/31/17 1900  Weight: 55.7 kg (122 lb 11.9 oz) 59.1 kg (130 lb 6.4 oz) 59.3 kg (130 lb 11.7 oz)    Examination:  General exam: Not in distress Respiratory system: Clear bilaterally, respiratory effort normal Cardiovascular system: Regular rate rhythm, S1-S2 normal. No pedal edema. Gastrointestinal system: Abdomen soft, nontender, nondistended. Bowel sound positive. Central nervous system: Alert and oriented. No focal neurological deficits. Skin: No rashes, lesions or ulcers AV fistula site with good bruit and thrill.   Data Reviewed: I have personally reviewed following labs and imaging studies  CBC:  Recent Labs Lab 05/25/17 1546 05/26/17 0943 05/27/17 1224 05/28/17 0545 05/29/17 0315  WBC 8.9 5.4 4.8 5.2 5.5  HGB 7.5* 7.2* 6.8* 7.6* 7.7*  HCT 22.7* 20.7* 20.1* 22.0* 23.1*  MCV 82.8  81.2 82.4 84.6 86.2  PLT 213 164 155 148* 341*   Basic Metabolic Panel:  Recent Labs Lab 05/25/17 1546 05/26/17 0944 05/27/17 1223 05/28/17 0545 05/29/17 0315  NA 132* 135 130* 135 133*  K 5.7* 3.6 3.0* 3.2* 3.4*  CL 100* 99* 95* 100* 99*  CO2 <7* 17* 26 25 24   GLUCOSE 101* 90 134* 127* 109*  BUN 227* 113* 52* 27* 49*  CREATININE 15.74* 9.64* 6.22* 3.96* 5.74*   CALCIUM 9.0 8.5* 8.6* 8.3* 8.4*  PHOS 4.7* 10.5* 5.8*  --   --    GFR: Estimated Creatinine Clearance: 11.8 mL/min (A) (by C-G formula based on SCr of 5.74 mg/dL (H)). Liver Function Tests:  Recent Labs Lab 05/25/17 1546 05/26/17 0944 05/27/17 1223  ALBUMIN 2.6* 2.5* 2.3*   No results for input(s): LIPASE, AMYLASE in the last 168 hours. No results for input(s): AMMONIA in the last 168 hours. Coagulation Profile:  Recent Labs Lab 05/28/17 0545  INR 1.06   Cardiac Enzymes: No results for input(s): CKTOTAL, CKMB, CKMBINDEX, TROPONINI in the last 168 hours. BNP (last 3 results) No results for input(s): PROBNP in the last 8760 hours. HbA1C: No results for input(s): HGBA1C in the last 72 hours. CBG:  Recent Labs Lab 05/27/17 0743 05/28/17 0503 05/30/17 0802 05/31/17 0754 06/01/17 0730  GLUCAP 112* 126* 107* 108* 113*   Lipid Profile: No results for input(s): CHOL, HDL, LDLCALC, TRIG, CHOLHDL, LDLDIRECT in the last 72 hours. Thyroid Function Tests: No results for input(s): TSH, T4TOTAL, FREET4, T3FREE, THYROIDAB in the last 72 hours. Anemia Panel: No results for input(s): VITAMINB12, FOLATE, FERRITIN, TIBC, IRON, RETICCTPCT in the last 72 hours. Sepsis Labs: No results for input(s): PROCALCITON, LATICACIDVEN in the last 168 hours.  Recent Results (from the past 240 hour(s))  Surgical pcr screen     Status: Abnormal   Collection Time: 05/27/17  8:27 PM  Result Value Ref Range Status   MRSA, PCR NEGATIVE NEGATIVE Final   Staphylococcus aureus POSITIVE (A) NEGATIVE Final    Comment: (NOTE) The Xpert SA Assay (FDA approved for NASAL specimens in patients 49 years of age and older), is one component of a comprehensive surveillance program. It is not intended to diagnose infection nor to guide or monitor treatment.          Radiology Studies: No results found.      Scheduled Meds: . amLODipine  10 mg Oral Daily  . atorvastatin  10 mg Oral q1800  .  Chlorhexidine Gluconate Cloth  6 each Topical Daily  . darbepoetin (ARANESP) injection - DIALYSIS  40 mcg Intravenous Q Mon-HD  . feeding supplement (NEPRO CARB STEADY)  237 mL Oral BID BM  . hydrALAZINE  50 mg Oral TID  . metoprolol tartrate  75 mg Oral BID  . multivitamin  1 tablet Oral QHS  . mupirocin ointment  1 application Nasal BID  . pantoprazole  40 mg Oral Daily  . polyethylene glycol  17 g Oral Daily  . senna-docusate  1 tablet Oral BID  . sucroferric oxyhydroxide  500 mg Oral TID WC   Continuous Infusions:   LOS: 8 days    Ryleigh Esqueda Tanna Furry, MD [facility] Pager [phone]  If 7PM-7AM, please contact night-coverage [email or web address] Password [email or web address] 06/01/2017, 1:21 PM

## 2017-06-02 LAB — BPAM RBC
BLOOD PRODUCT EXPIRATION DATE: 201810092359
BLOOD PRODUCT EXPIRATION DATE: 201810172359
ISSUE DATE / TIME: 201810012100
ISSUE DATE / TIME: 201810011718
UNIT TYPE AND RH: 6200
Unit Type and Rh: 6200

## 2017-06-02 LAB — TYPE AND SCREEN
ABO/RH(D): A POS
Antibody Screen: NEGATIVE
UNIT DIVISION: 0
Unit division: 0

## 2017-06-02 LAB — CBC
HEMATOCRIT: 27.1 % — AB (ref 39.0–52.0)
Hemoglobin: 9 g/dL — ABNORMAL LOW (ref 13.0–17.0)
MCH: 28 pg (ref 26.0–34.0)
MCHC: 32.8 g/dL (ref 30.0–36.0)
MCV: 85.2 fL (ref 78.0–100.0)
PLATELETS: 176 10*3/uL (ref 150–400)
RBC: 3.18 MIL/uL — ABNORMAL LOW (ref 4.22–5.81)
RDW: 15.1 % (ref 11.5–15.5)
WBC: 5 10*3/uL (ref 4.0–10.5)

## 2017-06-02 LAB — GLUCOSE, CAPILLARY: Glucose-Capillary: 87 mg/dL (ref 65–99)

## 2017-06-02 MED ORDER — POLYETHYLENE GLYCOL 3350 17 G PO PACK: 17.0000 g | PACK | Freq: Every day | ORAL | 0 refills | Status: DC | PRN

## 2017-06-02 NOTE — Progress Notes (Signed)
Patient Discharge: Disposition: Patient discharged to home. Education: Reviewed medications, prescriptions, follow-up appointments and discharge instructions regarding outpt HD appointment, verbalized understanding. IV: Discontinued IV before discharge. Telemetry: N/A Transportation:Patient escorted in w/c out of the unit. Belongings: Patient took all her belongings with him.

## 2017-06-02 NOTE — Discharge Summary (Signed)
Physician Discharge Summary  Thomas Robinson OMV:[id number] DOB: 1961/05/07 DOA: 05/24/2017  PCP: Patient, No Pcp Per  Admit date: 05/24/2017 Discharge date: 06/02/2017  Admitted From:home Disposition: home   Recommendations for Outpatient Follow-up:  1. Follow up with PCP in 1-2 weeks 2. Please obtain BMP/CBC in one week   Home Health:no Equipment/Devices:no Discharge Condition:stable CODE STATUS:full code Diet recommendation:heart healthy  Brief/Interim Summary: 56 year old gentleman with history of chronic kidney disease status 5, hypertension, hyperlipidemia, history of a stroke presenting to the ER with nausea vomiting or abdominal pain, anorexia and generalized fatigue. He was found to have BUN of 20-25, creatinine of 15.22 with hyperkalemia. Hemodialysis catheter was placed by IR on September 24 and started on hemodialysis.   # Newly diagnosed ESRD:  -Receiving hemodialysis treatment by a tunneled catheter.  -Patient underwent left radiocephalic AV fistula placement on September 2017 by vascular surgery.  Tolerating dialysis well. Pt has outpatient dialysis set up before discharge and explained to the patient.  #Symptomatic anemia due to anemia of chronic disease:  -Fecal occult blood test negative. Patient received red blood cell transfusion. -ESA and IV iron during dialysis as per nephrology. -Patient is clinically feeling better. -Monitor labs during dialysis.  #Hypertensive urgency on admission: Continue Norvasc, hydralazine, metoprolol.  Blood pressure improving. Continue to monitor  # Constipation: improved.   Discharge Diagnoses:  Principal Problem:   AKI (acute kidney injury) ([place]) Active Problems:   Hypertension, essential   History of completed stroke   CKD (chronic kidney disease), stage V (HCC)   Symptomatic anemia   Uremia   GI bleeding   Metabolic acidosis   Hypertensive urgency   Acute kidney injury superimposed on CKD [facility])    Constipation    Discharge Instructions  Discharge Instructions    Call MD for:  difficulty breathing, headache or visual disturbances    Complete by:  As directed    Call MD for:  extreme fatigue    Complete by:  As directed    Call MD for:  hives    Complete by:  As directed    Call MD for:  persistant dizziness or light-headedness    Complete by:  As directed    Call MD for:  persistant nausea and vomiting    Complete by:  As directed    Call MD for:  severe uncontrolled pain    Complete by:  As directed    Call MD for:  temperature >100.4    Complete by:  As directed    Diet - low sodium heart healthy    Complete by:  As directed    Discharge instructions    Complete by:  As directed    Appointment time for outpatient dialysis CONFIRMED. [place] ([place]). Harrell Lark, Sat at 1:10pm Needs to be there at 12:30p on first day (Thursday 06/04/17) to sign paperwork.   Increase activity slowly    Complete by:  As directed      Allergies as of 06/02/2017   No Known Allergies     Medication List    STOP taking these medications   furosemide 80 MG tablet Commonly known as:  LASIX     TAKE these medications   acetaminophen 500 MG tablet Commonly known as:  TYLENOL Take 1,000 mg by mouth every 4 (four) hours as needed for mild pain or headache. Reported on 12/17/2015   albuterol 108 (90 Base) MCG/ACT inhaler Commonly known as:  PROVENTIL HFA;VENTOLIN HFA Inhale 1-2  puffs into the lungs every 6 (six) hours as needed for wheezing or shortness of breath.   amLODipine 10 MG tablet Commonly known as:  NORVASC Take 1 tablet (10 mg total) by mouth daily.   aspirin 81 MG chewable tablet Chew 1 tablet (81 mg total) by mouth daily.   atorvastatin 10 MG tablet Commonly known as:  LIPITOR Take 1 tablet (10 mg total) by mouth daily at 6 PM.   calcitRIOL 0.25 MCG capsule Commonly known as:  ROCALTROL Take 0.25 mcg by mouth See admin  instructions. Take 1 capsule (0.25 mcg) by mouth 1st night, then take 2 capsules (0.5 mcg) 2nd night, then repeat   hydrALAZINE 25 MG tablet Commonly known as:  APRESOLINE Take 2 tablets (50 mg total) by mouth 3 (three) times daily. What changed:  when to take this   metoprolol tartrate 50 MG tablet Commonly known as:  LOPRESSOR Take 1.5 tablets (75 mg total) by mouth 2 (two) times daily.   polyethylene glycol packet Commonly known as:  MIRALAX / GLYCOLAX Take 17 g by mouth daily as needed.   UNKNOWN TO PATIENT Pt takes several unknown supplements-family was suppose to bring in and has not   ZZZQUIL 50 MG/30ML Liqd Generic drug:  DiphenhydrAMINE HCl Take 30 mLs by mouth at bedtime as needed (for sleep).      Follow-up Information    Conrad Golva, MD Follow up in 6 week(s).   Specialties:  Vascular Surgery, Cardiology Why:  Office will call you to arrange your appt (sent) Contact information: [place] [address] [address], [facility] up.   Contact information: [place] [address] [phone]          No Known Allergies  Consultations: Renal Vascular   Procedures/Studies: [facility]  AVF  Subjective: Seen and examined at bedside. Eager to go home. Denied n/v abdominal pain. No chest pain or SOB  Discharge Exam: Vitals:   06/02/17 0033 06/02/17 0437  BP: (!) 161/71 (!) 162/88  Pulse: 72 77  Resp: 19 18  Temp: 98 F (36.7 C) 98.9 F (37.2 C)  SpO2: 98% 95%   Vitals:   06/01/17 2100 06/01/17 2130 06/02/17 0033 06/02/17 0437  BP: (!) 167/73 (!) 155/76 (!) 161/71 (!) 162/88  Pulse: 86 91 72 77  Resp: 18 20 19 18   Temp: 98.6 F (37 C) 98.8 F (37.1 C) 98 F (36.7 C) 98.9 F (37.2 C)  TempSrc: Oral Oral Oral Oral  SpO2: 99% 96% 98% 95%  Weight:      Height:        General: Pt is alert, awake, not in acute distress Cardiovascular: RRR, S1/S2 +, no rubs, no  gallops Respiratory: CTA bilaterally, no wheezing, no rhonchi Abdominal: Soft, NT, ND, bowel sounds + Extremities: no edema, no cyanosis    The results of significant diagnostics from this hospitalization (including imaging, microbiology, ancillary and laboratory) are listed below for reference.     Microbiology: Recent Results (from the past 240 hour(s))  Surgical pcr screen     Status: Abnormal   Collection Time: 05/27/17  8:27 PM  Result Value Ref Range Status   MRSA, PCR NEGATIVE NEGATIVE Final   Staphylococcus aureus POSITIVE (A) NEGATIVE Final    Comment: (NOTE) The Xpert SA Assay (FDA approved for NASAL specimens in patients 87 years of age and older), is one component of a comprehensive surveillance program.  It is not intended to diagnose infection nor to guide or monitor treatment.      Labs: BNP (last 3 results) No results for input(s): BNP in the last 8760 hours. Basic Metabolic Panel:  Recent Labs Lab 05/27/17 1223 05/28/17 0545 05/29/17 0315 06/01/17 1500  NA 130* 135 133* 133*  K 3.0* 3.2* 3.4* 4.0  CL 95* 100* 99* 97*  CO2 26 25 24 22   GLUCOSE 134* 127* 109* 94  BUN 52* 27* 49* 93*  CREATININE 6.22* 3.96* 5.74* 8.03*  CALCIUM 8.6* 8.3* 8.4* 8.6*  PHOS 5.8*  --   --  5.5*   Liver Function Tests:  Recent Labs Lab 05/27/17 1223 06/01/17 1500  ALBUMIN 2.3* 2.2*   No results for input(s): LIPASE, AMYLASE in the last 168 hours. No results for input(s): AMMONIA in the last 168 hours. CBC:  Recent Labs Lab 05/27/17 1224 05/28/17 0545 05/29/17 0315 06/01/17 1515 06/02/17 0339  WBC 4.8 5.2 5.5 6.1 5.0  HGB 6.8* 7.6* 7.7* 6.6* 9.0*  HCT 20.1* 22.0* 23.1* 20.2* 27.1*  MCV 82.4 84.6 86.2 87.4 85.2  PLT 155 148* 149* 187 176   Cardiac Enzymes: No results for input(s): CKTOTAL, CKMB, CKMBINDEX, TROPONINI in the last 168 hours. BNP: Invalid input(s): POCBNP CBG:  Recent Labs Lab 05/28/17 0503 05/30/17 0802 05/31/17 0754 06/01/17 0730  06/02/17 0809  GLUCAP 126* 107* 108* 113* 87   D-Dimer No results for input(s): DDIMER in the last 72 hours. Hgb A1c No results for input(s): HGBA1C in the last 72 hours. Lipid Profile No results for input(s): CHOL, HDL, LDLCALC, TRIG, CHOLHDL, LDLDIRECT in the last 72 hours. Thyroid function studies No results for input(s): TSH, T4TOTAL, T3FREE, THYROIDAB in the last 72 hours.  Invalid input(s): FREET3 Anemia work up No results for input(s): VITAMINB12, FOLATE, FERRITIN, TIBC, IRON, RETICCTPCT in the last 72 hours. Urinalysis    Component Value Date/Time   COLORURINE YELLOW 05/24/2017 1729   APPEARANCEUR HAZY (A) 05/24/2017 1729   LABSPEC 1.011 05/24/2017 1729   PHURINE 5.0 05/24/2017 1729   GLUCOSEU 50 (A) 05/24/2017 1729   HGBUR SMALL (A) 05/24/2017 1729   BILIRUBINUR NEGATIVE 05/24/2017 1729   KETONESUR NEGATIVE 05/24/2017 1729   PROTEINUR 100 (A) 05/24/2017 1729   UROBILINOGEN 0.2 04/09/2015 1527   NITRITE NEGATIVE 05/24/2017 1729   LEUKOCYTESUR TRACE (A) 05/24/2017 1729   Sepsis Labs Invalid input(s): PROCALCITONIN,  WBC,  LACTICIDVEN Microbiology Recent Results (from the past 240 hour(s))  Surgical pcr screen     Status: Abnormal   Collection Time: 05/27/17  8:27 PM  Result Value Ref Range Status   MRSA, PCR NEGATIVE NEGATIVE Final   Staphylococcus aureus POSITIVE (A) NEGATIVE Final    Comment: (NOTE) The Xpert SA Assay (FDA approved for NASAL specimens in patients 25 years of age and older), is one component of a comprehensive surveillance program. It is not intended to diagnose infection nor to guide or monitor treatment.      Time coordinating discharge: 27 minutes  SIGNED:   [name], MD  [facility] 06/02/2017, 7:10 PM  If 7PM-7AM, please contact night-coverage [email or web address] Password [email or web address]

## 2017-06-02 NOTE — Progress Notes (Signed)
Appointment time for outpatient dialysis CONFIRMED.  [place] ([place]). Harrell Lark, Sat at 1:10pm  Needs to be there at 12:30p on first day (Thursday 06/04/17) to sign paperwork.  Loren Racer, PA-C  [facility] Pager: [phone]

## 2017-06-04 DIAGNOSIS — E7849 Other hyperlipidemia: Secondary | ICD-10-CM | POA: Insufficient documentation

## 2017-06-04 DIAGNOSIS — D689 Coagulation defect, unspecified: Secondary | ICD-10-CM | POA: Insufficient documentation

## 2017-06-04 DIAGNOSIS — N2581 Secondary hyperparathyroidism of renal origin: Secondary | ICD-10-CM

## 2017-06-04 DIAGNOSIS — E876 Hypokalemia: Secondary | ICD-10-CM | POA: Insufficient documentation

## 2017-06-04 HISTORY — DX: Other hyperlipidemia: E78.49

## 2017-06-04 HISTORY — DX: Hypokalemia: E87.6

## 2017-06-04 HISTORY — DX: Secondary hyperparathyroidism of renal origin: N25.81

## 2017-06-06 DIAGNOSIS — E440 Moderate protein-calorie malnutrition: Secondary | ICD-10-CM

## 2017-06-06 DIAGNOSIS — J90 Pleural effusion, not elsewhere classified: Secondary | ICD-10-CM | POA: Diagnosis present

## 2017-06-06 HISTORY — DX: Moderate protein-calorie malnutrition: E44.0

## 2017-07-02 NOTE — Progress Notes (Signed)
    Postoperative Access Visit   History of Present Illness   Thomas Robinson is a 56 y.o. year old male who presents for postoperative follow-up for: left radiocephalic arteriovenous fistula (Date: 05/28/17).  The patient's wounds are healed.  The patient notes no steal symptoms.  The patient is able to complete their activities of daily living.  The patient's current symptoms are: none.   Physical Examination   Vitals:   07/03/17 1251  BP: (!) 168/75  Pulse: 71  Resp: 18  Temp: (!) 97.5 F (36.4 C)  TempSrc: Oral  SpO2: 100%  Weight: 130 lb (59 kg)  Height: 5\' 8"  (1.727 m)    left arm Incision is healed, skin feels warm, hand grip is 5/5, sensation in digits is intact, palpable thrill, bruit can be auscultated     Medical Decision Making   Thomas Robinson is a 56 y.o. year old male who presents s/p left radiocephalic arteriovenous fistula   The patient's access is ready for use.  Thank you for allowing [place] to participate in this patient's care.   Adele Barthel, MD, FACS [facility] Office: [phone] Pager: [phone]

## 2017-07-03 ENCOUNTER — Encounter: Payer: Self-pay | Admitting: Vascular Surgery

## 2017-07-03 ENCOUNTER — Ambulatory Visit (INDEPENDENT_AMBULATORY_CARE_PROVIDER_SITE_OTHER): Payer: Self-pay | Admitting: Vascular Surgery

## 2017-07-03 VITALS — BP 168/75 | HR 71 | Temp 97.5°F | Resp 18 | Ht 68.0 in | Wt 130.0 lb

## 2017-07-03 DIAGNOSIS — N185 Chronic kidney disease, stage 5: Secondary | ICD-10-CM

## 2017-07-08 DIAGNOSIS — Z23 Encounter for immunization: Secondary | ICD-10-CM

## 2017-07-08 HISTORY — DX: Encounter for immunization: Z23

## 2017-08-04 DIAGNOSIS — N2581 Secondary hyperparathyroidism of renal origin: Secondary | ICD-10-CM | POA: Diagnosis not present

## 2017-08-04 DIAGNOSIS — E876 Hypokalemia: Secondary | ICD-10-CM | POA: Diagnosis not present

## 2017-08-04 DIAGNOSIS — N186 End stage renal disease: Secondary | ICD-10-CM | POA: Diagnosis not present

## 2017-08-04 DIAGNOSIS — D631 Anemia in chronic kidney disease: Secondary | ICD-10-CM | POA: Diagnosis not present

## 2017-08-10 IMAGING — CR DG CHEST 2V
2 series · 2 of 2 positions shown · non-contrast
Comparison: 04/09/2015

CLINICAL DATA: Short of breath

EXAM:
CHEST  2 VIEW

[w chest pa]
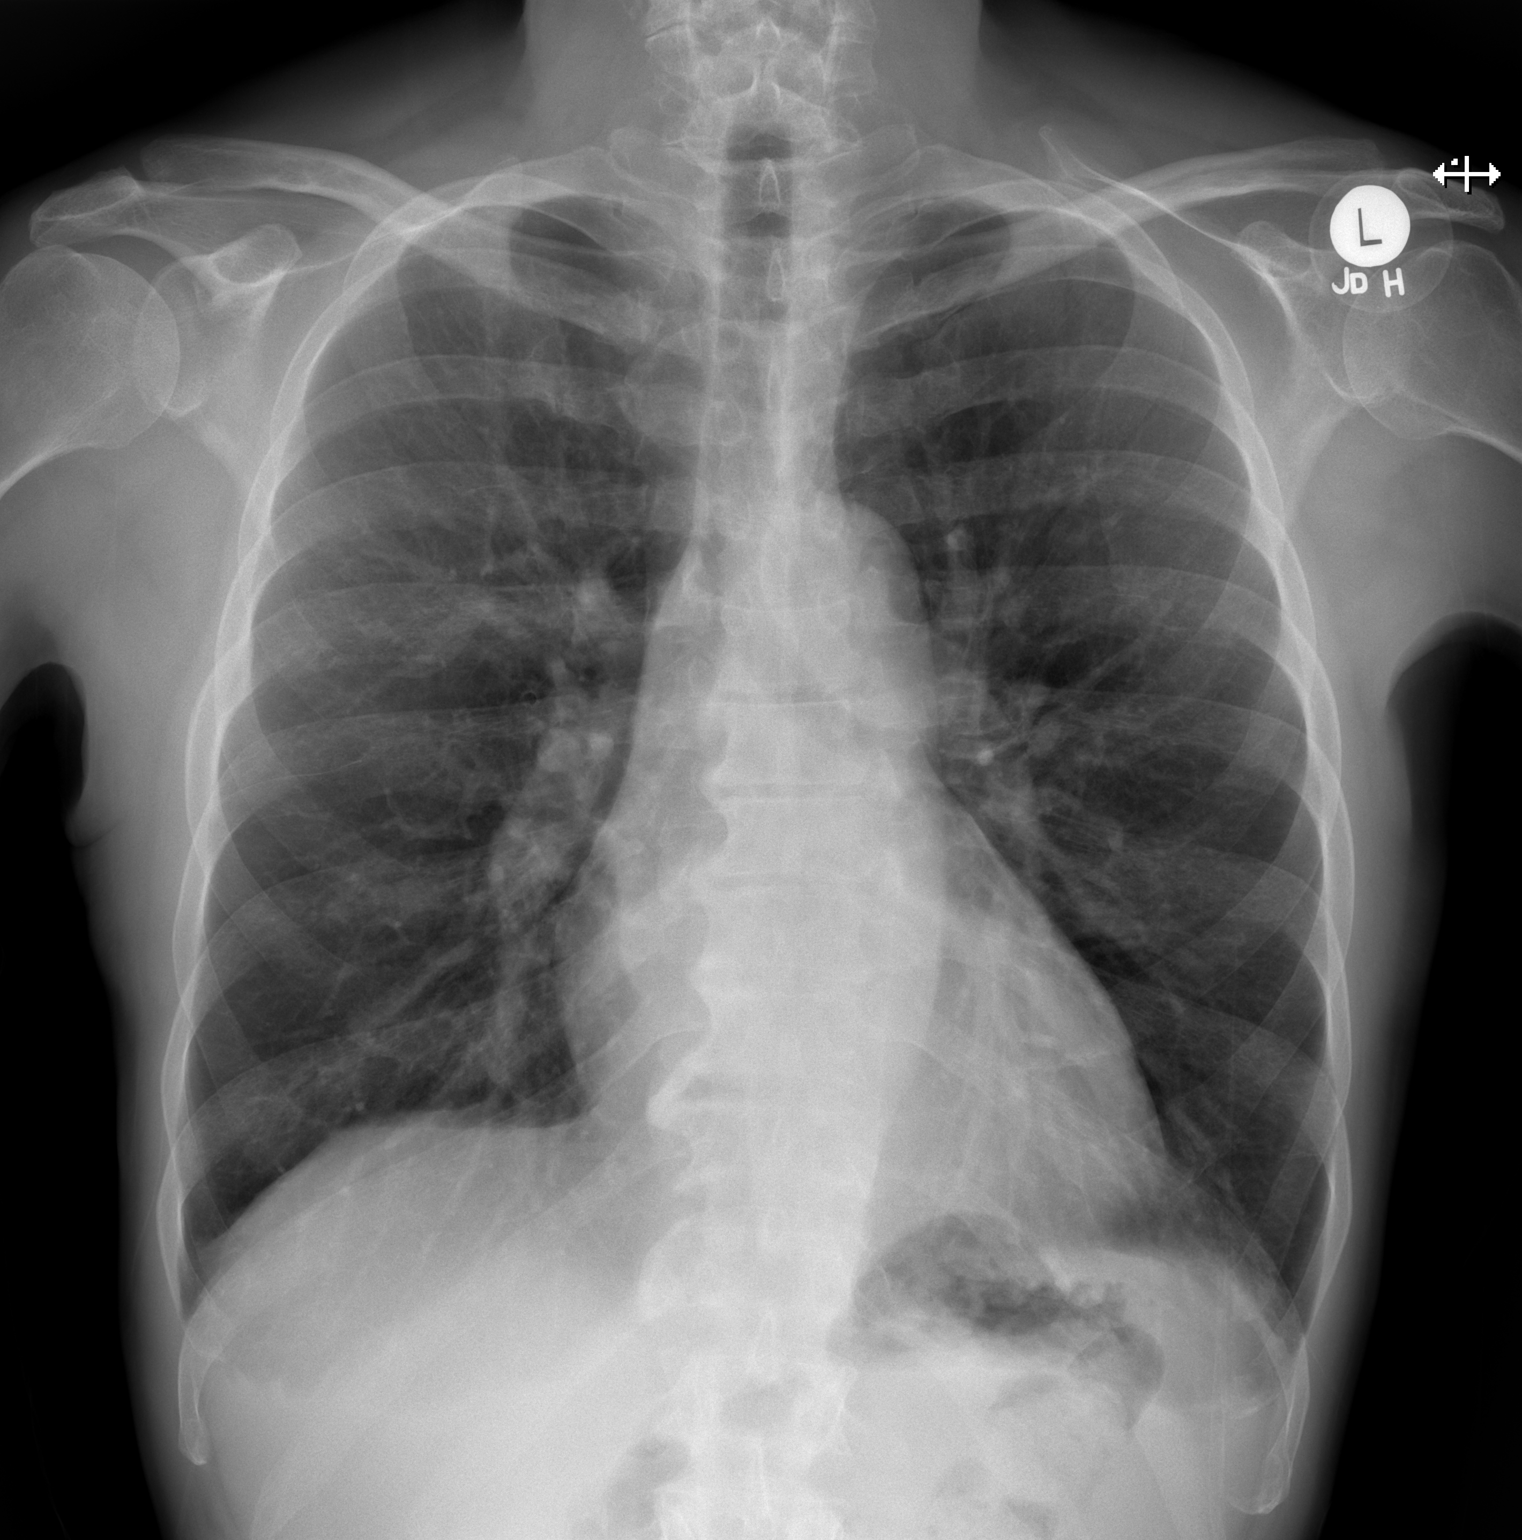

[w chest lat]
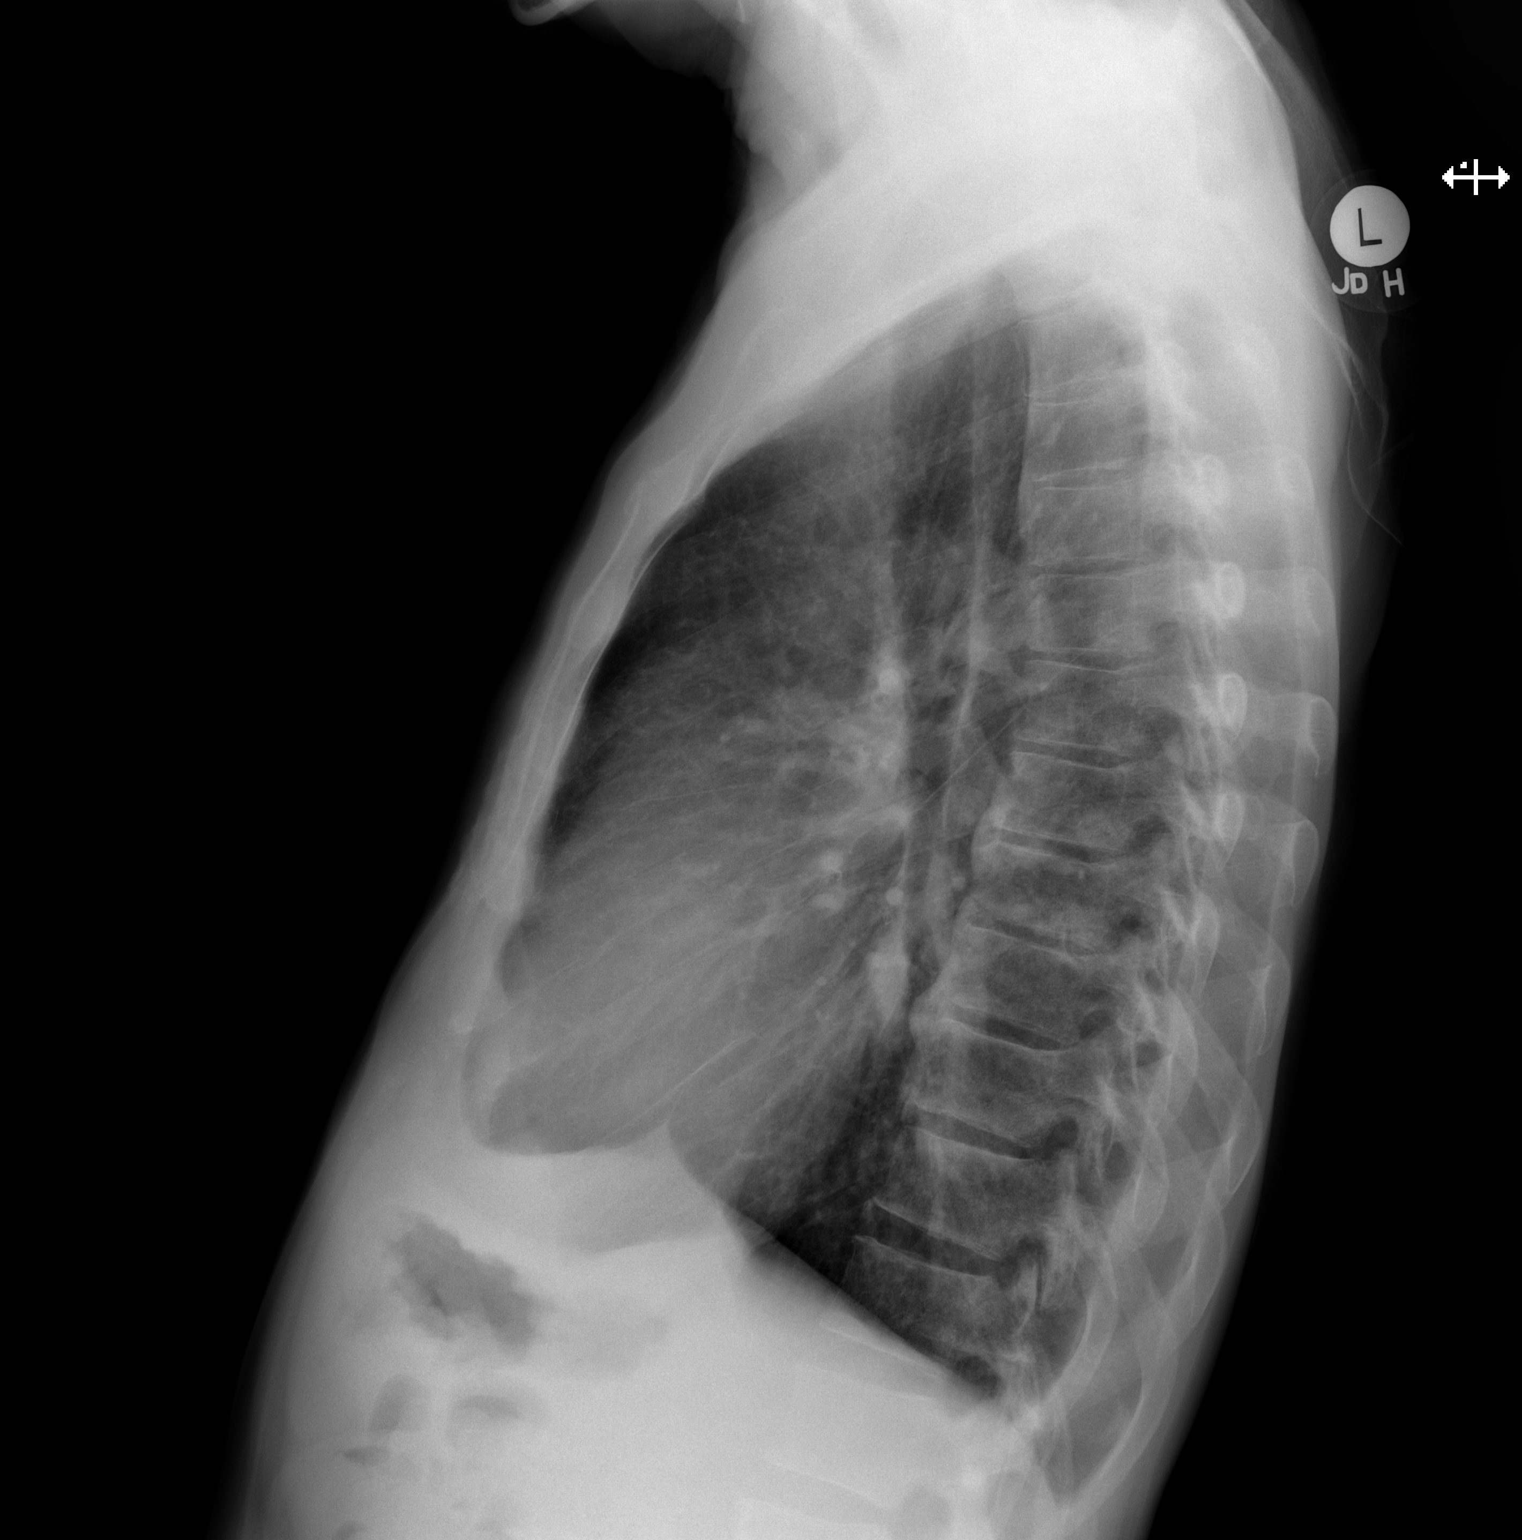

[2 of 2 positions shown; findings below may reference images not displayed]

FINDINGS: The heart size and mediastinal contours are within normal limits.
Both lungs are clear. The visualized skeletal structures are
unremarkable.
IMPRESSION: No active cardiopulmonary disease.

## 2017-08-12 DIAGNOSIS — E876 Hypokalemia: Secondary | ICD-10-CM | POA: Diagnosis not present

## 2017-08-12 DIAGNOSIS — N186 End stage renal disease: Secondary | ICD-10-CM | POA: Diagnosis not present

## 2017-08-12 DIAGNOSIS — D631 Anemia in chronic kidney disease: Secondary | ICD-10-CM | POA: Diagnosis not present

## 2017-08-12 DIAGNOSIS — N2581 Secondary hyperparathyroidism of renal origin: Secondary | ICD-10-CM | POA: Diagnosis not present

## 2017-08-17 IMAGING — CR DG WRIST COMPLETE 3+V*R*
4 series · 4 of 4 positions shown · non-contrast
Comparison: None.

CLINICAL DATA: Status post fall, right wrist pain.

EXAM:
RIGHT WRIST - COMPLETE 3+ VIEW

[x wrist pa right]
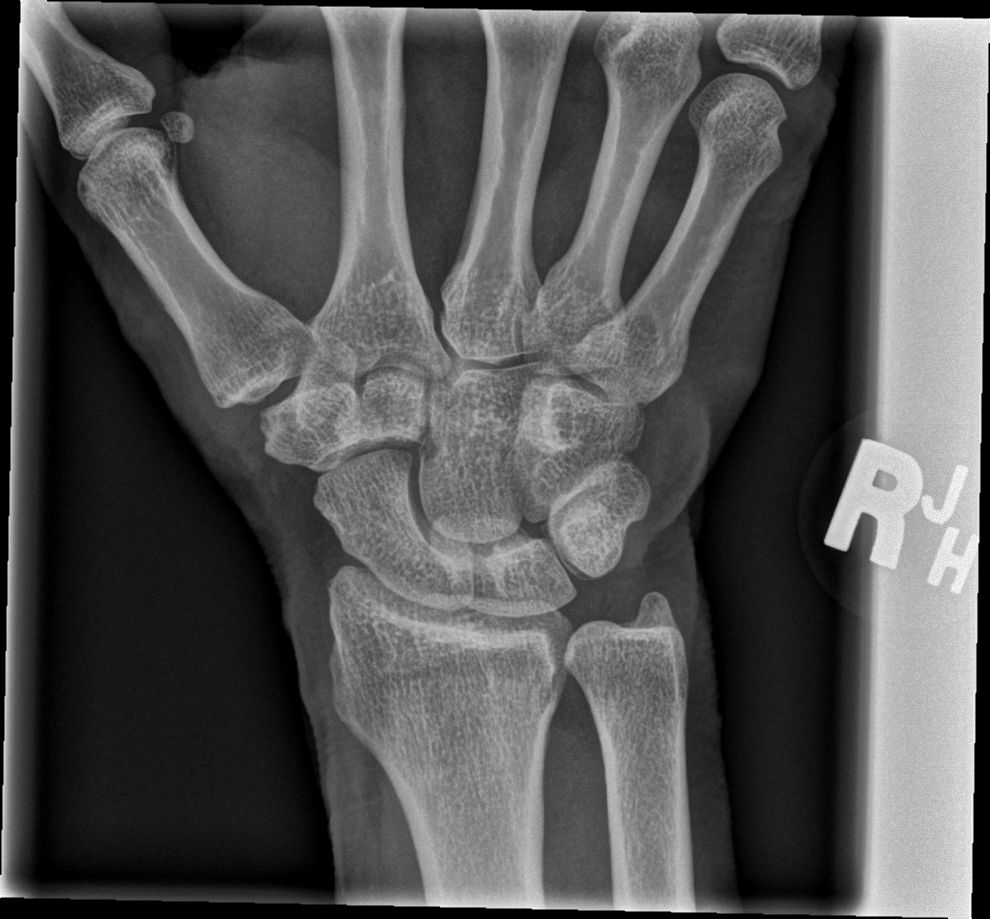

[x wrist navicular view right]
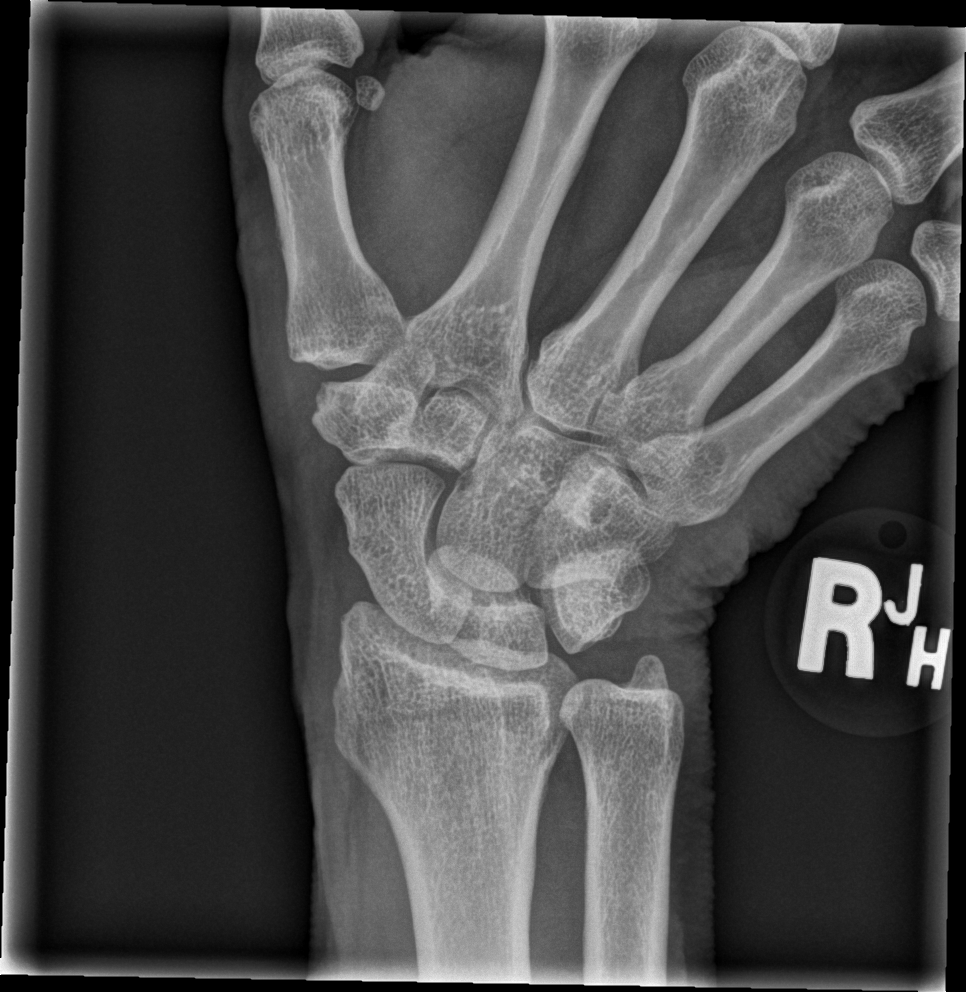

[x wrist obl right]
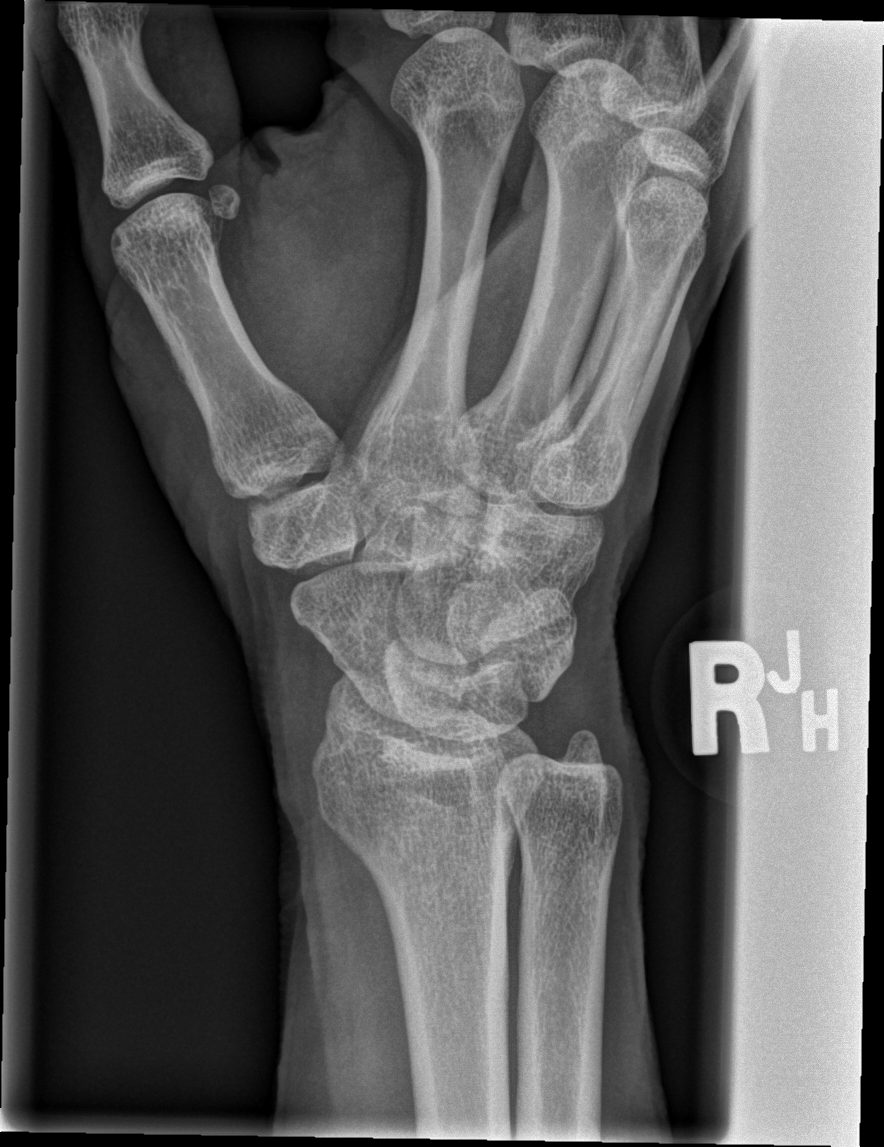

[x wrist lat right]
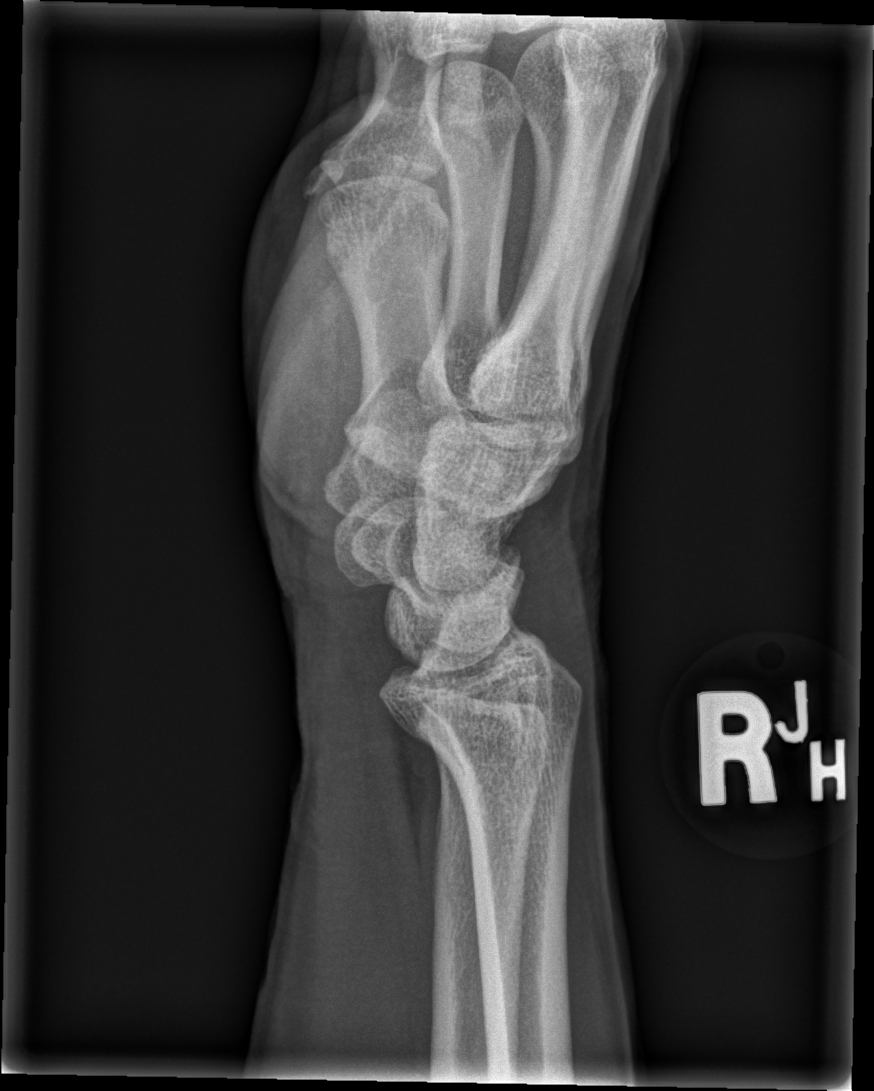

[4 of 4 positions shown; findings below may reference images not displayed]

FINDINGS: There is no evidence of fracture or dislocation. There is mild
osteoarthritis of the first CMC joint. Soft tissues are
unremarkable.
IMPRESSION: No acute osseous injury of the right wrist.

## 2017-08-23 ENCOUNTER — Inpatient Hospital Stay (HOSPITAL_COMMUNITY)
Admission: EM | Admit: 2017-08-23 | Discharge: 2017-08-24 | DRG: 291 | Discharge status: Against Medical Advice | Payer: Medicare Other | Attending: Internal Medicine | Admitting: Internal Medicine

## 2017-08-23 ENCOUNTER — Encounter (HOSPITAL_COMMUNITY): Payer: Self-pay | Admitting: *Deleted

## 2017-08-23 ENCOUNTER — Other Ambulatory Visit: Payer: Self-pay

## 2017-08-23 ENCOUNTER — Emergency Department (HOSPITAL_COMMUNITY): Payer: Medicare Other

## 2017-08-23 DIAGNOSIS — E785 Hyperlipidemia, unspecified: Secondary | ICD-10-CM | POA: Diagnosis present

## 2017-08-23 DIAGNOSIS — E8889 Other specified metabolic disorders: Secondary | ICD-10-CM | POA: Diagnosis present

## 2017-08-23 DIAGNOSIS — R778 Other specified abnormalities of plasma proteins: Secondary | ICD-10-CM

## 2017-08-23 DIAGNOSIS — R059 Cough, unspecified: Secondary | ICD-10-CM | POA: Diagnosis present

## 2017-08-23 DIAGNOSIS — D631 Anemia in chronic kidney disease: Secondary | ICD-10-CM | POA: Diagnosis present

## 2017-08-23 DIAGNOSIS — N186 End stage renal disease: Secondary | ICD-10-CM | POA: Diagnosis present

## 2017-08-23 DIAGNOSIS — I160 Hypertensive urgency: Secondary | ICD-10-CM | POA: Diagnosis present

## 2017-08-23 DIAGNOSIS — Z9115 Patient's noncompliance with renal dialysis: Secondary | ICD-10-CM

## 2017-08-23 DIAGNOSIS — Z5321 Procedure and treatment not carried out due to patient leaving prior to being seen by health care provider: Secondary | ICD-10-CM | POA: Diagnosis not present

## 2017-08-23 DIAGNOSIS — I132 Hypertensive heart and chronic kidney disease with heart failure and with stage 5 chronic kidney disease, or end stage renal disease: Secondary | ICD-10-CM | POA: Diagnosis not present

## 2017-08-23 DIAGNOSIS — Z79899 Other long term (current) drug therapy: Secondary | ICD-10-CM

## 2017-08-23 DIAGNOSIS — J9601 Acute respiratory failure with hypoxia: Secondary | ICD-10-CM | POA: Diagnosis present

## 2017-08-23 DIAGNOSIS — E872 Acidosis: Secondary | ICD-10-CM | POA: Diagnosis not present

## 2017-08-23 DIAGNOSIS — E8729 Other acidosis: Secondary | ICD-10-CM

## 2017-08-23 DIAGNOSIS — N179 Acute kidney failure, unspecified: Secondary | ICD-10-CM | POA: Diagnosis not present

## 2017-08-23 DIAGNOSIS — Z7982 Long term (current) use of aspirin: Secondary | ICD-10-CM

## 2017-08-23 DIAGNOSIS — R7989 Other specified abnormal findings of blood chemistry: Secondary | ICD-10-CM

## 2017-08-23 DIAGNOSIS — Z992 Dependence on renal dialysis: Secondary | ICD-10-CM

## 2017-08-23 DIAGNOSIS — I509 Heart failure, unspecified: Secondary | ICD-10-CM

## 2017-08-23 DIAGNOSIS — Z8673 Personal history of transient ischemic attack (TIA), and cerebral infarction without residual deficits: Secondary | ICD-10-CM

## 2017-08-23 DIAGNOSIS — N2581 Secondary hyperparathyroidism of renal origin: Secondary | ICD-10-CM | POA: Diagnosis present

## 2017-08-23 DIAGNOSIS — J45909 Unspecified asthma, uncomplicated: Secondary | ICD-10-CM | POA: Diagnosis present

## 2017-08-23 DIAGNOSIS — R05 Cough: Secondary | ICD-10-CM | POA: Diagnosis present

## 2017-08-23 DIAGNOSIS — N189 Chronic kidney disease, unspecified: Secondary | ICD-10-CM

## 2017-08-23 LAB — CBC
HCT: 21 % — ABNORMAL LOW (ref 39.0–52.0)
Hemoglobin: 7.1 g/dL — ABNORMAL LOW (ref 13.0–17.0)
MCH: 33.3 pg (ref 26.0–34.0)
MCHC: 33.8 g/dL (ref 30.0–36.0)
MCV: 98.6 fL (ref 78.0–100.0)
PLATELETS: 151 10*3/uL (ref 150–400)
RBC: 2.13 MIL/uL — AB (ref 4.22–5.81)
RDW: 14.6 % (ref 11.5–15.5)
WBC: 4.3 10*3/uL (ref 4.0–10.5)

## 2017-08-23 LAB — COMPREHENSIVE METABOLIC PANEL
ALK PHOS: 87 U/L (ref 38–126)
ALT: 16 U/L — AB (ref 17–63)
AST: 10 U/L — AB (ref 15–41)
Albumin: 3.3 g/dL — ABNORMAL LOW (ref 3.5–5.0)
Anion gap: 20 — ABNORMAL HIGH (ref 5–15)
BILIRUBIN TOTAL: 0.9 mg/dL (ref 0.3–1.2)
BUN: 138 mg/dL — AB (ref 6–20)
CALCIUM: 9 mg/dL (ref 8.9–10.3)
CHLORIDE: 107 mmol/L (ref 101–111)
CO2: 12 mmol/L — ABNORMAL LOW (ref 22–32)
CREATININE: 13.1 mg/dL — AB (ref 0.61–1.24)
GFR, EST AFRICAN AMERICAN: 4 mL/min — AB (ref >60)
GFR, EST NON AFRICAN AMERICAN: 4 mL/min — AB (ref >60)
Glucose, Bld: 100 mg/dL — ABNORMAL HIGH (ref 65–99)
Potassium: 4.2 mmol/L (ref 3.5–5.1)
Sodium: 139 mmol/L (ref 135–145)
TOTAL PROTEIN: 6.3 g/dL — AB (ref 6.5–8.1)

## 2017-08-23 LAB — TROPONIN I: TROPONIN I: 0.1 ng/mL — AB (ref <0.03)

## 2017-08-23 LAB — I-STAT CHEM 8, ED
BUN: 128 mg/dL — AB (ref 6–20)
CREATININE: 13.2 mg/dL — AB (ref 0.61–1.24)
Calcium, Ion: 1.15 mmol/L (ref 1.15–1.40)
Chloride: 110 mmol/L (ref 101–111)
Glucose, Bld: 98 mg/dL (ref 65–99)
HCT: 20 % — ABNORMAL LOW (ref 39.0–52.0)
Hemoglobin: 6.8 g/dL — CL (ref 13.0–17.0)
POTASSIUM: 4.2 mmol/L (ref 3.5–5.1)
Sodium: 140 mmol/L (ref 135–145)
TCO2: 16 mmol/L — ABNORMAL LOW (ref 22–32)

## 2017-08-23 LAB — BRAIN NATRIURETIC PEPTIDE: B Natriuretic Peptide: 2872.6 pg/mL — ABNORMAL HIGH (ref 0.0–100.0)

## 2017-08-23 MED ORDER — BENZONATATE 100 MG PO CAPS
200.0000 mg | ORAL_CAPSULE | Freq: Once | ORAL | Status: AC
  Administered 2017-08-23: 200 mg via ORAL
  Filled 2017-08-23: qty 2

## 2017-08-23 NOTE — ED Provider Notes (Signed)
[place] EMERGENCY DEPARTMENT Provider Note   CSN: [id number] Arrival date & time: 08/23/17  1830     History   Chief Complaint Chief Complaint  Patient presents with  . Cough    HPI [name] is a 56 y.o. male.  56 year old male with hx of CVA, hypertension, hyperlipidemia, ESRD on dialysis since September 2018 presents to the ED for c/o cough.  He states that he has had nasal congestion as well as a congested sounding cough over the past 2 weeks.  Cough has been more forceful over the past week to the point where he is coughing up brown phlegm and some bright red blood at times.  He notes worsening shortness of breath which is aggravated with exertion.  He has needed to sleep in a chair as lying flat will cause him worsening shortness of breath.  He also reports feeling his "heart pounding" when he sleeps supine.  He denies any known fevers over 100.4 F or sick contacts.  He states that he feels very thirsty.  He has not been to dialysis in the past week and states that he does not like the side effects of dialysis.  He notes feeling severe cramping in his bilateral lower extremities with spasms to his hands at times.      Past Medical History:  Diagnosis Date  . Asthma   . Chronic kidney disease   . Hyperlipidemia   . Hypertension   . Stroke ([place])   . Symptomatic anemia 05/24/2017    Patient Active Problem List   Diagnosis Date Noted  . Constipation   . CKD (chronic kidney disease), stage V ([place]) 05/24/2017  . Symptomatic anemia 05/24/2017  . Uremia 05/24/2017  . GI bleeding 05/24/2017  . Metabolic acidosis 31/49/7026  . Hypertensive urgency 05/24/2017  . Acute kidney injury superimposed on CKD ([place]) 05/24/2017  . Cerebral infarction due to thrombosis of cerebral artery ([place])   . AKI (acute kidney injury) ([place]) 04/10/2015  . History of completed stroke   . Hypertension, essential 04/09/2015    Past Surgical History:  Procedure  Laterality Date  . AV FISTULA PLACEMENT Left 05/28/2017   Procedure: LEFT ARM ARTERIOVENOUS (AV) FISTULA CREATION;  Surgeon: Conrad Waldo, MD;  Location: [facility];  Service: Vascular;  Laterality: Left;  . IR FLUORO GUIDE CV LINE RIGHT  05/25/2017  . IR US GUIDE VASC ACCESS RIGHT  05/25/2017  . RINOPLASTY         Home Medications    Prior to Admission medications   Medication Sig Start Date End Date Taking? Authorizing Provider  acetaminophen (TYLENOL) 500 MG tablet Take 1,000 mg by mouth every 4 (four) hours as needed for mild pain or headache. Reported on 12/17/2015   Yes [provider]  albuterol (PROVENTIL HFA;VENTOLIN HFA) 108 (90 BASE) MCG/ACT inhaler Inhale 1-2 puffs into the lungs every 6 (six) hours as needed for wheezing or shortness of breath. 04/12/15  Yes [name], MD  aspirin 81 MG chewable tablet Chew 1 tablet (81 mg total) by mouth daily. 04/12/15  Yes [name], MD  DiphenhydrAMINE HCl (ZZZQUIL) 50 MG/30ML LIQD Take 30 mLs by mouth at bedtime as needed (for sleep).   Yes [provider]  hydrALAZINE (APRESOLINE) 25 MG tablet Take 2 tablets (50 mg total) by mouth 3 (three) times daily. Patient taking differently: Take 50 mg by mouth 2 (two) times daily.  12/17/15  Yes [name], [name], MD  metoprolol (LOPRESSOR) 50 MG tablet Take 1.5 tablets (75 mg total) by mouth 2 (two) times daily. 09/15/16  Yes [name], [name], MD  polyethylene glycol [facility] / Floria Raveling) packet Take 17 g by mouth daily as needed. Patient taking differently: Take 17 g by mouth daily as needed for mild constipation.  06/02/17  Yes [name], MD  sucroferric oxyhydroxide [facility]) 500 MG chewable tablet Chew 1,000 mg by mouth 3 (three) times daily with meals.   Yes [provider]    Family History Family History  Problem Relation Age of Onset  . Cancer Mother     Social History Social History   Tobacco Use  . Smoking status: Never Smoker  .  Smokeless tobacco: Never Used  Substance Use Topics  . Alcohol use: No    Alcohol/week: 0.0 oz    Comment: fORMER DRINKER  . Drug use: No     Allergies   Patient has no known allergies.   Review of Systems Review of Systems Ten systems reviewed and are negative for acute change, except as noted in the HPI.    Physical Exam Updated Vital Signs BP (!) 179/102   Pulse (!) 108   Temp 98.6 F (37 C) (Oral)   Resp (!) 22   SpO2 93%   Physical Exam  Constitutional: He is oriented to person, place, and time. He appears well-developed and well-nourished. No distress.  Chronically ill appearing, in NAD. Pleasant.  HENT:  Head: Normocephalic and atraumatic.  Eyes: Conjunctivae and EOM are normal. No scleral icterus.  Neck: Normal range of motion. JVD present.  Cardiovascular: Regular rhythm and intact distal pulses.  Tachycardia Palpable thrill over LUE fistula.  Pulmonary/Chest: Effort normal. No stridor. No respiratory distress. He has no rales.  Lungs grossly clear. There is a slight, faint expiratory wheeze in bilateral bases. Patient speaking in truncated sentences with prolonged speech.    Musculoskeletal: Normal range of motion.  No BLE edema  Neurological: He is alert and oriented to person, place, and time. He exhibits normal muscle tone. Coordination normal.  GCS 15. Patient moving all extremities.  Skin: Skin is warm and dry. No rash noted. He is not diaphoretic. No erythema. No pallor.  Psychiatric: He has a normal mood and affect. His behavior is normal.  Nursing note and vitals reviewed.    ED Treatments / Results  Labs (all labs ordered are listed, but only abnormal results are displayed) Labs Reviewed  COMPREHENSIVE METABOLIC PANEL - Abnormal; Notable for the following components:      Result Value   CO2 12 (*)    Glucose, Bld 100 (*)    BUN 138 (*)    Creatinine, Ser 13.10 (*)    Total Protein 6.3 (*)    Albumin 3.3 (*)    AST 10 (*)    ALT 16 (*)     GFR calc non Af Amer 4 (*)    GFR calc Af Amer 4 (*)    Anion gap 20 (*)    All other components within normal limits  CBC - Abnormal; Notable for the following components:   RBC 2.13 (*)    Hemoglobin 7.1 (*)    HCT 21.0 (*)    All other components within normal limits  TROPONIN I - Abnormal; Notable for the following components:   Troponin I 0.10 (*)    All other components within normal limits  BRAIN NATRIURETIC PEPTIDE - Abnormal; Notable for the following components:   B  Natriuretic Peptide 2,872.6 (*)    All other components within normal limits  I-STAT CHEM 8, ED - Abnormal; Notable for the following components:   BUN 128 (*)    Creatinine, Ser 13.20 (*)    TCO2 16 (*)    Hemoglobin 6.8 (*)    HCT 20.0 (*)    All other components within normal limits    EKG  EKG Interpretation  Date/Time:  Sunday August 23 2017 18:39:17 EST Ventricular Rate:  102 PR Interval:  158 QRS Duration: 84 QT Interval:  354 QTC Calculation: 461 R Axis:   75 Text Interpretation:  Sinus tachycardia Left ventricular hypertrophy with repolarization abnormality Abnormal ECG Confirmed by Zavitz, Joshua ([id number]) on 08/23/2017 6:47:48 PM       Radiology Dg Chest 2 View  Result Date: 08/23/2017 CLINICAL DATA:  Cough and cold 1 week.  No dialysis for 1 week. EXAM: CHEST  2 VIEW COMPARISON:  12/05/2016 FINDINGS: Right IJ dialysis catheter has tip over the SVC. Lungs are adequately inflated without focal airspace consolidation or effusion. There is mild prominence of the perihilar markings suggesting mild vascular congestion. Cardiomediastinal silhouette and remainder of the exam is unchanged. IMPRESSION: Findings suggesting mild vascular congestion. Electronically Signed   By: Daniel  Boyle M.D.   On: 08/23/2017 19:27    Procedures Procedures (including critical care time)  Medications Ordered in ED Medications  benzonatate (TESSALON) capsule 200 mg (200 mg Oral Given 08/23/17 2304)      Initial Impression / Assessment and Plan / ED Course  I have reviewed the triage vital signs and the nursing notes.  Pertinent labs & imaging results that were available during my care of the patient were reviewed by me and considered in my medical decision making (see chart for details).     56  year old male with a history of ESRD on dialysis since September presents to the emergency department for a cough.  He reports orthopnea as well as dyspnea on exertion.  No chest pain other than when coughing.  Cough has been largely dry, nonproductive; however, patient has had a few episodes of hemoptysis which he attributes to forceful coughing.  The patient has been noncompliant with his dialysis over the past week.  He states that he has also had incomplete dialysis sessions in the past secondary to increasing spasms in his bilateral lower extremities which are painful making it difficult for him to tolerate a full session. He has mild wheezing in his bilateral lower lobes.  Sentences become more truncated with obvious dyspnea with prolonged speech.  Patient chronically ill in appearance.  Chest x-ray today shows vascular congestion which is likely from noncompliance with dialysis.  BNP suggests heart failure as well.  Troponin is mildly elevated suspected also to be secondary to heart failure and, likely, a component of ESRD.  Patient further noted to have an anion gap metabolic acidosis secondary to worsening renal failure.  This is consistent with his admission back in September.  No hyperkalemia.  Anemia has also worsened.  Plan for admission for emergent/urgent dialysis for management of renal failure and CHF exacerbation.   Final Clinical Impressions(s) / ED Diagnoses   Final diagnoses:  Acute on chronic congestive heart failure, unspecified heart failure type ([place])  Acute renal failure superimposed on chronic kidney disease, unspecified CKD stage, unspecified acute renal failure type  (HCC)  Increased anion gap metabolic acidosis  Elevated troponin    ED Discharge Orders    None  Antonietta Breach, PA-C 08/23/17 [place], [place], DO 08/25/17 2004

## 2017-08-23 NOTE — ED Notes (Signed)
Pt refused writer to draw blood work, pt prefers for blood drawn from port

## 2017-08-23 NOTE — ED Triage Notes (Signed)
The pt is a dialysis pt that has not been dialyzed in at least a week he reports that he has ben sick with a cold and a cough   Chest congestion he has a dialysis catheter in his rt chest  He wants blood drawn off that  NOT AT TRIAGE

## 2017-08-23 NOTE — ED Notes (Signed)
Gave pt ice chips, per Claiborne Billings - PA.

## 2017-08-24 ENCOUNTER — Encounter (HOSPITAL_COMMUNITY): Payer: Self-pay

## 2017-08-24 DIAGNOSIS — R05 Cough: Secondary | ICD-10-CM

## 2017-08-24 DIAGNOSIS — N189 Chronic kidney disease, unspecified: Secondary | ICD-10-CM

## 2017-08-24 DIAGNOSIS — Z5321 Procedure and treatment not carried out due to patient leaving prior to being seen by health care provider: Secondary | ICD-10-CM | POA: Diagnosis not present

## 2017-08-24 DIAGNOSIS — I509 Heart failure, unspecified: Secondary | ICD-10-CM

## 2017-08-24 DIAGNOSIS — I120 Hypertensive chronic kidney disease with stage 5 chronic kidney disease or end stage renal disease: Secondary | ICD-10-CM | POA: Diagnosis not present

## 2017-08-24 DIAGNOSIS — D631 Anemia in chronic kidney disease: Secondary | ICD-10-CM | POA: Diagnosis present

## 2017-08-24 DIAGNOSIS — J45909 Unspecified asthma, uncomplicated: Secondary | ICD-10-CM | POA: Diagnosis present

## 2017-08-24 DIAGNOSIS — E8889 Other specified metabolic disorders: Secondary | ICD-10-CM | POA: Diagnosis present

## 2017-08-24 DIAGNOSIS — Z7982 Long term (current) use of aspirin: Secondary | ICD-10-CM | POA: Diagnosis not present

## 2017-08-24 DIAGNOSIS — Z8673 Personal history of transient ischemic attack (TIA), and cerebral infarction without residual deficits: Secondary | ICD-10-CM | POA: Diagnosis not present

## 2017-08-24 DIAGNOSIS — I132 Hypertensive heart and chronic kidney disease with heart failure and with stage 5 chronic kidney disease, or end stage renal disease: Secondary | ICD-10-CM | POA: Diagnosis present

## 2017-08-24 DIAGNOSIS — R748 Abnormal levels of other serum enzymes: Secondary | ICD-10-CM

## 2017-08-24 DIAGNOSIS — N179 Acute kidney failure, unspecified: Secondary | ICD-10-CM | POA: Diagnosis not present

## 2017-08-24 DIAGNOSIS — Z9115 Patient's noncompliance with renal dialysis: Secondary | ICD-10-CM | POA: Diagnosis not present

## 2017-08-24 DIAGNOSIS — I160 Hypertensive urgency: Secondary | ICD-10-CM

## 2017-08-24 DIAGNOSIS — N2581 Secondary hyperparathyroidism of renal origin: Secondary | ICD-10-CM | POA: Diagnosis present

## 2017-08-24 DIAGNOSIS — Z992 Dependence on renal dialysis: Secondary | ICD-10-CM | POA: Diagnosis not present

## 2017-08-24 DIAGNOSIS — E872 Acidosis: Secondary | ICD-10-CM

## 2017-08-24 DIAGNOSIS — J9601 Acute respiratory failure with hypoxia: Secondary | ICD-10-CM

## 2017-08-24 DIAGNOSIS — Z79899 Other long term (current) drug therapy: Secondary | ICD-10-CM | POA: Diagnosis not present

## 2017-08-24 DIAGNOSIS — E785 Hyperlipidemia, unspecified: Secondary | ICD-10-CM | POA: Diagnosis present

## 2017-08-24 DIAGNOSIS — N186 End stage renal disease: Secondary | ICD-10-CM | POA: Diagnosis present

## 2017-08-24 LAB — RESPIRATORY PANEL BY PCR
ADENOVIRUS-RVPPCR: NOT DETECTED
Bordetella pertussis: NOT DETECTED
CORONAVIRUS OC43-RVPPCR: NOT DETECTED
Chlamydophila pneumoniae: NOT DETECTED
Coronavirus 229E: NOT DETECTED
Coronavirus HKU1: NOT DETECTED
Coronavirus NL63: NOT DETECTED
INFLUENZA A-RVPPCR: NOT DETECTED
Influenza B: NOT DETECTED
Metapneumovirus: NOT DETECTED
Mycoplasma pneumoniae: NOT DETECTED
PARAINFLUENZA VIRUS 1-RVPPCR: NOT DETECTED
PARAINFLUENZA VIRUS 3-RVPPCR: NOT DETECTED
PARAINFLUENZA VIRUS 4-RVPPCR: NOT DETECTED
Parainfluenza Virus 2: NOT DETECTED
RHINOVIRUS / ENTEROVIRUS - RVPPCR: NOT DETECTED
Respiratory Syncytial Virus: NOT DETECTED

## 2017-08-24 LAB — COMPREHENSIVE METABOLIC PANEL
ALBUMIN: 3.1 g/dL — AB (ref 3.5–5.0)
ALK PHOS: 83 U/L (ref 38–126)
ALT: 15 U/L — ABNORMAL LOW (ref 17–63)
AST: 10 U/L — AB (ref 15–41)
Anion gap: 19 — ABNORMAL HIGH (ref 5–15)
BILIRUBIN TOTAL: 0.9 mg/dL (ref 0.3–1.2)
BUN: 136 mg/dL — AB (ref 6–20)
CALCIUM: 9.3 mg/dL (ref 8.9–10.3)
CO2: 13 mmol/L — ABNORMAL LOW (ref 22–32)
Chloride: 107 mmol/L (ref 101–111)
Creatinine, Ser: 12.99 mg/dL — ABNORMAL HIGH (ref 0.61–1.24)
GFR calc Af Amer: 4 mL/min — ABNORMAL LOW (ref >60)
GFR, EST NON AFRICAN AMERICAN: 4 mL/min — AB (ref >60)
GLUCOSE: 108 mg/dL — AB (ref 65–99)
POTASSIUM: 4.1 mmol/L (ref 3.5–5.1)
Sodium: 139 mmol/L (ref 135–145)
TOTAL PROTEIN: 6.1 g/dL — AB (ref 6.5–8.1)

## 2017-08-24 LAB — PHOSPHORUS: PHOSPHORUS: 6.5 mg/dL — AB (ref 2.5–4.6)

## 2017-08-24 LAB — CBC
HCT: 19.7 % — ABNORMAL LOW (ref 39.0–52.0)
HCT: 28.1 % — ABNORMAL LOW (ref 39.0–52.0)
HEMATOCRIT: 18.2 % — AB (ref 39.0–52.0)
HEMOGLOBIN: 6.2 g/dL — AB (ref 13.0–17.0)
Hemoglobin: 6.7 g/dL — CL (ref 13.0–17.0)
Hemoglobin: 9.7 g/dL — ABNORMAL LOW (ref 13.0–17.0)
MCH: 33.5 pg (ref 26.0–34.0)
MCH: 33.5 pg (ref 26.0–34.0)
MCH: 31.9 pg (ref 26.0–34.0)
MCHC: 34 g/dL (ref 30.0–36.0)
MCHC: 34.1 g/dL (ref 30.0–36.0)
MCHC: 34.5 g/dL (ref 30.0–36.0)
MCV: 98.5 fL (ref 78.0–100.0)
MCV: 98.4 fL (ref 78.0–100.0)
MCV: 92.4 fL (ref 78.0–100.0)
PLATELETS: 146 10*3/uL — AB (ref 150–400)
Platelets: 148 10*3/uL — ABNORMAL LOW (ref 150–400)
Platelets: 164 10*3/uL (ref 150–400)
RBC: 2 MIL/uL — ABNORMAL LOW (ref 4.22–5.81)
RBC: 1.85 MIL/uL — ABNORMAL LOW (ref 4.22–5.81)
RBC: 3.04 MIL/uL — ABNORMAL LOW (ref 4.22–5.81)
RDW: 14.6 % (ref 11.5–15.5)
RDW: 14.8 % (ref 11.5–15.5)
RDW: 16.9 % — ABNORMAL HIGH (ref 11.5–15.5)
WBC: 3.8 10*3/uL — AB (ref 4.0–10.5)
WBC: 2.8 10*3/uL — ABNORMAL LOW (ref 4.0–10.5)
WBC: 4.5 10*3/uL (ref 4.0–10.5)

## 2017-08-24 LAB — RENAL FUNCTION PANEL
Albumin: 3.1 g/dL — ABNORMAL LOW (ref 3.5–5.0)
Anion gap: 13 (ref 5–15)
BUN: 44 mg/dL — ABNORMAL HIGH (ref 6–20)
CO2: 25 mmol/L (ref 22–32)
Calcium: 9.1 mg/dL (ref 8.9–10.3)
Chloride: 101 mmol/L (ref 101–111)
Creatinine, Ser: 5.82 mg/dL — ABNORMAL HIGH (ref 0.61–1.24)
GFR calc Af Amer: 11 mL/min — ABNORMAL LOW (ref >60)
GFR calc non Af Amer: 10 mL/min — ABNORMAL LOW (ref >60)
Glucose, Bld: 115 mg/dL — ABNORMAL HIGH (ref 65–99)
Phosphorus: 3.7 mg/dL (ref 2.5–4.6)
Potassium: 3.7 mmol/L (ref 3.5–5.1)
Sodium: 139 mmol/L (ref 135–145)

## 2017-08-24 LAB — OCCULT BLOOD X 1 CARD TO LAB, STOOL: Fecal Occult Bld: NEGATIVE

## 2017-08-24 LAB — PREPARE RBC (CROSSMATCH)

## 2017-08-24 LAB — TROPONIN I: TROPONIN I: 0.11 ng/mL — AB (ref <0.03)

## 2017-08-24 LAB — MAGNESIUM: MAGNESIUM: 2.7 mg/dL — AB (ref 1.7–2.4)

## 2017-08-24 LAB — GLUCOSE, CAPILLARY: GLUCOSE-CAPILLARY: 111 mg/dL — AB (ref 65–99)

## 2017-08-24 LAB — MRSA PCR SCREENING: MRSA BY PCR: NEGATIVE

## 2017-08-24 LAB — HEPATITIS B SURFACE ANTIGEN: Hepatitis B Surface Ag: NEGATIVE

## 2017-08-24 MED ORDER — HYDRALAZINE HCL 20 MG/ML IJ SOLN
10.0000 mg | Freq: Once | INTRAMUSCULAR | Status: DC
  Filled 2017-08-24: qty 1

## 2017-08-24 MED ORDER — PENTAFLUOROPROP-TETRAFLUOROETH EX AERO: 1.0000 "application " | INHALATION_SPRAY | CUTANEOUS | Status: DC | PRN

## 2017-08-24 MED ORDER — FUROSEMIDE 10 MG/ML IJ SOLN: 120.0000 mg | Freq: Once | INTRAVENOUS | Status: DC

## 2017-08-24 MED ORDER — HYDRALAZINE HCL 50 MG PO TABS
50.0000 mg | ORAL_TABLET | Freq: Three times a day (TID) | ORAL | Status: DC
  Administered 2017-08-24: 50 mg via ORAL
  Filled 2017-08-24: qty 2

## 2017-08-24 MED ORDER — RENA-VITE PO TABS: 1.0000 | ORAL_TABLET | Freq: Every day | ORAL | Status: DC

## 2017-08-24 MED ORDER — GUAIFENESIN-DM 100-10 MG/5ML PO SYRP
5.0000 mL | ORAL_SOLUTION | ORAL | Status: DC | PRN
  Administered 2017-08-24: 5 mL via ORAL
  Filled 2017-08-24: qty 5

## 2017-08-24 MED ORDER — NEPRO/CARBSTEADY PO LIQD
237.0000 mL | Freq: Two times a day (BID) | ORAL | Status: DC
  Administered 2017-08-24: 237 mL via ORAL

## 2017-08-24 MED ORDER — ALBUTEROL SULFATE (2.5 MG/3ML) 0.083% IN NEBU: 2.5000 mg | INHALATION_SOLUTION | RESPIRATORY_TRACT | Status: DC | PRN

## 2017-08-24 MED ORDER — POLYETHYLENE GLYCOL 3350 17 G PO PACK: 17.0000 g | PACK | Freq: Every day | ORAL | Status: DC | PRN

## 2017-08-24 MED ORDER — GUAIFENESIN ER 600 MG PO TB12
1200.0000 mg | ORAL_TABLET | Freq: Two times a day (BID) | ORAL | Status: DC
  Administered 2017-08-24: 1200 mg via ORAL
  Filled 2017-08-24: qty 2

## 2017-08-24 MED ORDER — METOPROLOL TARTRATE 50 MG PO TABS
75.0000 mg | ORAL_TABLET | Freq: Two times a day (BID) | ORAL | Status: DC
  Administered 2017-08-24: 75 mg via ORAL
  Filled 2017-08-24: qty 1

## 2017-08-24 MED ORDER — HEPARIN SODIUM (PORCINE) 1000 UNIT/ML DIALYSIS: 1000.0000 [IU] | INTRAMUSCULAR | Status: DC | PRN

## 2017-08-24 MED ORDER — SODIUM CHLORIDE 0.9 % IV SOLN: Freq: Once | INTRAVENOUS | Status: DC

## 2017-08-24 MED ORDER — LABETALOL HCL 5 MG/ML IV SOLN
10.0000 mg | INTRAVENOUS | Status: DC | PRN
  Administered 2017-08-24: 10 mg via INTRAVENOUS
  Filled 2017-08-24: qty 4

## 2017-08-24 MED ORDER — SODIUM CHLORIDE 0.9 % IV SOLN: 100.0000 mL | INTRAVENOUS | Status: DC | PRN

## 2017-08-24 MED ORDER — ALBUTEROL SULFATE HFA 108 (90 BASE) MCG/ACT IN AERS
1.0000 | INHALATION_SPRAY | Freq: Four times a day (QID) | RESPIRATORY_TRACT | Status: DC | PRN
  Administered 2017-08-24: 2 via RESPIRATORY_TRACT
  Filled 2017-08-24: qty 6.7

## 2017-08-24 MED ORDER — CALCITRIOL 0.25 MCG PO CAPS
0.2500 ug | ORAL_CAPSULE | Freq: Every day | ORAL | Status: DC
  Administered 2017-08-24: 0.25 ug via ORAL
  Filled 2017-08-24: qty 1

## 2017-08-24 MED ORDER — ALBUTEROL SULFATE (2.5 MG/3ML) 0.083% IN NEBU: 2.5000 mg | INHALATION_SOLUTION | Freq: Four times a day (QID) | RESPIRATORY_TRACT | Status: DC | PRN

## 2017-08-24 MED ORDER — IPRATROPIUM-ALBUTEROL 0.5-2.5 (3) MG/3ML IN SOLN
3.0000 mL | Freq: Four times a day (QID) | RESPIRATORY_TRACT | Status: DC
  Administered 2017-08-24 (×2): 3 mL via RESPIRATORY_TRACT
  Filled 2017-08-24 (×2): qty 3

## 2017-08-24 MED ORDER — LIDOCAINE HCL (PF) 1 % IJ SOLN: 5.0000 mL | INTRAMUSCULAR | Status: DC | PRN

## 2017-08-24 MED ORDER — CALCIUM ACETATE (PHOS BINDER) 667 MG PO CAPS
1334.0000 mg | ORAL_CAPSULE | Freq: Three times a day (TID) | ORAL | Status: DC
  Administered 2017-08-24 (×2): 1334 mg via ORAL
  Filled 2017-08-24 (×3): qty 2

## 2017-08-24 MED ORDER — ALTEPLASE 2 MG IJ SOLR: 2.0000 mg | Freq: Once | INTRAMUSCULAR | Status: DC | PRN

## 2017-08-24 MED ORDER — FUROSEMIDE 10 MG/ML IJ SOLN
120.0000 mg | Freq: Once | INTRAVENOUS | Status: AC
  Administered 2017-08-24: 120 mg via INTRAVENOUS
  Filled 2017-08-24: qty 10

## 2017-08-24 MED ORDER — ASPIRIN 81 MG PO CHEW
81.0000 mg | CHEWABLE_TABLET | Freq: Every day | ORAL | Status: DC
  Administered 2017-08-24: 81 mg via ORAL
  Filled 2017-08-24: qty 1

## 2017-08-24 MED ORDER — SODIUM CHLORIDE 0.9% FLUSH
3.0000 mL | Freq: Two times a day (BID) | INTRAVENOUS | Status: DC
  Administered 2017-08-24: 3 mL via INTRAVENOUS

## 2017-08-24 MED ORDER — HEPARIN SODIUM (PORCINE) 5000 UNIT/ML IJ SOLN
5000.0000 [IU] | Freq: Three times a day (TID) | INTRAMUSCULAR | Status: DC
  Administered 2017-08-24: 5000 [IU] via SUBCUTANEOUS
  Filled 2017-08-24: qty 1

## 2017-08-24 MED ORDER — AMLODIPINE BESYLATE 10 MG PO TABS: 10.0000 mg | ORAL_TABLET | Freq: Every day | ORAL | Status: DC

## 2017-08-24 MED ORDER — LIDOCAINE-PRILOCAINE 2.5-2.5 % EX CREA: 1.0000 "application " | TOPICAL_CREAM | CUTANEOUS | Status: DC | PRN

## 2017-08-24 NOTE — ED Notes (Addendum)
MD Stana Bunting paged concerning pt's BP 212/86. See new orders

## 2017-08-24 NOTE — H&P (Addendum)
History and Physical   Thomas Robinson OZH:[id number] DOB: 05-30-1961 DOA: 08/23/2017  PCP: Patient, No Pcp Per  Chief Complaint: Cough  HPI:  This is a 56 year old man with recently declared end-stage renal disease secondary to hypertension, reported history of CVA, hypertension, he is presenting with chief complaint of her cough.  Of note patient was hospitalized on September 23 through October 2 of this year where his progressive kidney failure resulted in development of end-stage renal disease. He had a dialysis access catheter placed an AV fistula created. His AV fistula is not yet mature. He reports that since he was discharged he has been having problems with muscle cramping during dialysis sessions. This resulted in him stopping dialysis before completion of the session. He reports he does not know what his dry weight is. He cites that he continues to make urine. Other symptoms including orthopnea and increasing dyspnea on exertion are present. He denies any fevers, chills. He does report a mildly productive cough primarily clear sputum. Reports myalgias.  He also reports some pain at the vascular access catheter site.  He reports he lives with a friend, currently works as a Presenter, broadcasting, does not smoke, does not drink alcohol. He reports she's made some dietary changes after being diagnosed with end-stage renal disease. He is supposed to be dialyzed on a Tuesday Thursday Saturday regimen, primarily the afternoons.  ED Course: Vital signs remarkable for tachycardia, hypertension with systolic blood pressures over 180, increase respiratory rate 26, new oxygen requirement requiring 2 L to maintain O2 saturations above 94%. Diagnostics obtained revealed labs consistent with incisional disease including a when necessary of 128, anion gap of 20. Hemoglobin initially checked was 7.1 on recheck is 6.8. Cardiac enzymes mildly elevated at 0.1, BNP of over 2000. Chest x-ray revealing mild  congestion.  Review of Systems: A complete ROS was obtained; pertinent positives negatives are denoted in the HPI. Otherwise, all systems are negative.   Past Medical History:  Diagnosis Date  . Asthma   . Chronic kidney disease   . Hyperlipidemia   . Hypertension   . Stroke ([place])   . Symptomatic anemia 05/24/2017   Social History   Socioeconomic History  . Marital status: Single    Spouse name: Not on file  . Number of children: Not on file  . Years of education: Not on file  . Highest education level: Not on file  Social Needs  . Financial resource strain: Not on file  . Food insecurity - worry: Not on file  . Food insecurity - inability: Not on file  . Transportation needs - medical: Not on file  . Transportation needs - non-medical: Not on file  Occupational History  . Not on file  Tobacco Use  . Smoking status: Never Smoker  . Smokeless tobacco: Never Used  Substance and Sexual Activity  . Alcohol use: No    Alcohol/week: 0.0 oz    Comment: fORMER DRINKER  . Drug use: No  . Sexual activity: Not on file  Other Topics Concern  . Not on file  Social History Narrative   Lives with a roommate in a one story home.  Has 1 child.  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  Education: high school.   Family History  Problem Relation Age of Onset  . Cancer Mother     Physical Exam: Vitals:   08/23/17 1843 08/23/17 2015 08/23/17 2242 08/24/17 0027  BP: (!) 238/158 (!) 143/96 (!) 179/102 (!) 212/86  Pulse: (!) 110 93 (!) 108 (!) 103  Resp: (!) 22 15 (!) 22 16  Temp: 98.6 F (37 C)     TempSrc: Oral     SpO2: 93% 95% 93% 94%   General: Appears in no acute distress ENT: Grossly normal hearing, MMM. Cardiovascular: Tachycardia present. No M/R/G. No LE edema. Right chest dialysis catheter in place, no frank surrounding erythema.  Left AVF with bruit and thrill. Respiratory: CTA bilaterally. No wheezes or crackles. Increased respiratory effort present.  RR 24-28.    Abdomen: Soft, non-tender. Bowel sounds present.  Skin: No rash or induration seen on limited exam.  Dry skin. Musculoskeletal: Grossly normal tone BUE/BLE. Appropriate ROM.  Psychiatric: Grossly normal mood and affect. Neurologic: Moves all extremities in coordinated fashion.  I have personally reviewed the following labs, culture data, and imaging studies.  Assessment/Plan:  Acute hypoxic respiratory failure 2/2 to volume overload from ESRD Patient with a report of cough, orthopnea and diagnostics revealing elevated BNP, new oxygen requirement in the setting of non-adherence to dialysis for over 1 week. Plan: -will provide trial of furosemide 120 mg IV once as a bridge until dialysis in AM -dialysis diet with fluid restriction -phos binder + calcitriol -consult nephrology in AM for dialysis, consider HD strategies that may mitigate muscle cramping to encourage patient adherence -supplemental O2 as needed -trend Mg and Phos  Other problems -HTN: currently SBP elevated likely related to volume overload, will continue with home BB, hydralazine, anticipate improvement with HD sessions -Anemia: likely related to ESRD, consider transfusion after HD session, no signs of active bleeding, avoiding transfusion now as to not worsen volume overload -Elevated troponin: possibly demand in setting of volume overload, continue to trend, if develops CP EKG -Elevated CV risk: continue ASA -Cough with generalized myalgias and reported pain at catheter site: blood culture + RVP  DVT prophylaxis: [place] hep Code Status: full code Disposition Plan: Anticipate D/C home in 2-5 days Consults called: left a message for nephrology consult team [phone]) to evaluate the patient in the AM for HD Admission status: admit to hospital medicine   Cheri Rous, MD [facility] Page:[phone]  If 7PM-7AM, please contact night-coverage [email or web address] Password [email or web address]

## 2017-08-24 NOTE — Progress Notes (Signed)
Patient call from the room to inform the RN that he wants to go AMA,patient is alert and orientedx4.Forrest Moron NP ,hospitalist on call made aware.Patient informed and explain the risk involved in leaving the hospital against medical advice.[place] Advice form signed by the patient and witnessed by Jairo Ben RN(charge RN).IV and telemetry discontinued,arm bands removed.Patient leave the unit himself ambulatory.Patient placement and central telemetry informed. Alexis Mizuno RN

## 2017-08-24 NOTE — Progress Notes (Signed)
Initial Nutrition Assessment  DOCUMENTATION CODES:   (Will assess for malnutrition at follow-up)  INTERVENTION:  - Continue Nepro Shake BID, each supplement provides 425 kcal and 19 grams protein - Continue to encourage PO intakes of meals and supplements.  - RD will assess for any diet education-related needs or other nutrition needs at follow-up.  NUTRITION DIAGNOSIS:   Increased nutrient needs related to chronic illness(ESRD on HD) as evidenced by estimated needs.  GOAL:   Patient will meet greater than or equal to 90% of their needs  MONITOR:   PO intake, Supplement acceptance, Weight trends, Labs  REASON FOR ASSESSMENT:   Malnutrition Screening Tool  ASSESSMENT:   56 year old man with recently declared end-stage renal disease secondary to hypertension, reported history of CVA, HTN, he is presenting with chief complaint of cough. Of note, patient was hospitalized on 9/ 23-10/2 where his progressive kidney failure resulted in development of ESRD. He had a dialysis access catheter placed and an AV fistula created. His AV fistula is not yet mature. He reports that since he was discharged he has been having problems with muscle cramping during dialysis sessions. This resulted in him stopping dialysis before completion of the session. He reports he does not know what his dry weight is. He cites that he continues to make urine. Other symptoms including orthopnea and increasing dyspnea on exertion are present. Reports myalgias and also reports some pain at the vascular access catheter site.  Pt seen for MST. BMI indicates normal weight status. Noted pt on 1.2L fluid restriction/day. Unable to talk with pt x2 attempts earlier today. No intakes have been documented since admission. Pt was in the bathroom at time of second attempted visit and no family/visitors were present in his room. Information from H&P outlined above. Notes indicate that pt is on a Tuesday, Thursday, Saturday HD  schedule.   Notes also state that PTA pt had made diet changes; will need to ask about these changes at follow-up and also assess if pt needs anything concerning diet education. Per chart review, pt is unsure of dry body weight. He weighed 130 lbs on 10/1 and currently weighs 127 lbs. This indicates 3 lb weight loss (2.3% body weight) in the past 2.5-3 months which is not significant for time frame. Will need to complete NFPE at follow-up and also monitor weight changes following Lasix dose today. Pt is currently ordered Nepro Shake BID per ONS protocol and pt accepted offering of supplement earlier this AM.   Medications reviewed; 1 capsule Phoslo TID with meals, 120 mg IV Lasix x1 dose today.  Labs reviewed; BUN: 136 mg/dL, creatinine: 13 mg/dL, Phos: 6.5 mg/dL, Mg: 2.7 mg/dL, GFR: 4 mL/min.      NUTRITION - FOCUSED PHYSICAL EXAM:  Unable to complete/assess at this time and will attempt at follow-up  Diet Order:  Diet renal with fluid restriction Fluid restriction: 1200 mL Fluid; Room service appropriate? Yes; Fluid consistency: Thin  EDUCATION NEEDS:   No education needs have been identified at this time  Skin:  Skin Assessment: Reviewed RN Assessment  Last BM:  PTA/unknown  Height:   Ht Readings from Last 1 Encounters:  08/24/17 5' 7.5" (1.715 m)    Weight:   Wt Readings from Last 1 Encounters:  08/24/17 127 lb 13.9 oz (58 kg)    Ideal Body Weight:  63.64 kg  BMI:  Body mass index is 19.73 kg/m.  Estimated Nutritional Needs:   Kcal:  1740-1915 (30-33 kcal/kg)  Protein:  85-95  grams   Fluid:  1 L + UOP/day     Jarome Matin, MS, RD, LDN, [facility] Inpatient Clinical Dietitian Pager # [phone] After hours/weekend pager # [phone]

## 2017-08-24 NOTE — Progress Notes (Signed)
Pt arrived from ED. CHG bath completed. Vitals stable. Pt oriented to room and placed on telemetry. Plan of care reviewed with pt. No complaints at this time. Will continue to follow.

## 2017-08-24 NOTE — Progress Notes (Signed)
The patient was admitted early this AM after midnight and H and P has been reviewed and I am in current agreement with the Assessment and Plan done by Dr. Cheri Rous. Additional changes to the plan of care have been made accordingly. The patient is a 56 year old Caucasian male with a PMH of new ESRD on HD TTSat, HTN, Hx of CVA, HLD, Anemia of Chronic Kidney Disease, and other comorbids who presented to the ED with worsening SOB. States his last HD was over a week ago. Hb/Hct was noted to be 6.7/19.7 this AM and patient to undergo Dialysis along with transfusion of 2 units of pRBC's. FOBT collected and was Negative though patient is having dark black stools. Patient's Cough/SOB is slightly improved and RSV was negative. Will discontinue Droplet Precautions and will have patient dialyzed for Volume Management. Per Nephro resume ESA and Fe Load. Will continue to monitor patient's clinical response to intervention and repeat bloodwork in AM.

## 2017-08-24 NOTE — Consult Note (Signed)
[facility] Renal Consultation Note    Indication for Consultation:  Management of ESRD/hemodialysis; anemia, hypertension/volume and secondary hyperparathyroidism PCP:  HPI: Cornel Werber is a 56 y.o. male relatively new ESRD started HD 9/23-10/02/18 at [facility]. H/O medical non-compliance, HTN, CVA, HLD, anemia. Last HD treatment was 08/12/2017 at which he stayed for 2:28 hrs. He reports severe cramping and says he still makes urine. Does not understand that even though he is making urine, he has no kidney function. He was told by HD staff to come to ED D/T missed dialysis treatments. Mild vascular congestion on CXR. Scr 12.99 K+ 4.1 HGB 6.7-no ESA since November 2018. WBC 3.8 this. He has been admitted as observation patient, placed on droplet precautions. Respiratory panel is pending.   Currently he having black liquid diarrhea, literally dripping on floor and is in and out of bathroom. He C/O diarrhea, mild SOB, abdominal pain. He says he can't sit through dialysis because of cramping but agrees to try today.  Will have HD today and transfuse. Check stool for C diff and FOB.     Past Medical History:  Diagnosis Date  . Asthma   . Chronic kidney disease   . Hyperlipidemia   . Hypertension   . Stroke ([place])   . Symptomatic anemia 05/24/2017   Past Surgical History:  Procedure Laterality Date  . AV FISTULA PLACEMENT Left 05/28/2017   Procedure: LEFT ARM ARTERIOVENOUS (AV) FISTULA CREATION;  Surgeon: Conrad Sausalito, MD;  Location: [facility];  Service: Vascular;  Laterality: Left;  . IR FLUORO GUIDE CV LINE RIGHT  05/25/2017  . IR US GUIDE VASC ACCESS RIGHT  05/25/2017  . RINOPLASTY     Family History  Problem Relation Age of Onset  . Cancer Mother    Social History:  reports that  has never smoked. he has never used smokeless tobacco. He reports that he does not drink alcohol or use drugs. No Known Allergies Prior to Admission medications   Medication Sig Start Date End Date  Taking? Authorizing Provider  acetaminophen (TYLENOL) 500 MG tablet Take 1,000 mg by mouth every 4 (four) hours as needed for mild pain or headache. Reported on 12/17/2015   Yes [provider]  albuterol (PROVENTIL HFA;VENTOLIN HFA) 108 (90 BASE) MCG/ACT inhaler Inhale 1-2 puffs into the lungs every 6 (six) hours as needed for wheezing or shortness of breath. 04/12/15  Yes [name], MD  aspirin 81 MG chewable tablet Chew 1 tablet (81 mg total) by mouth daily. 04/12/15  Yes [name], MD  DiphenhydrAMINE HCl (ZZZQUIL) 50 MG/30ML LIQD Take 30 mLs by mouth at bedtime as needed (for sleep).   Yes [provider]  hydrALAZINE (APRESOLINE) 25 MG tablet Take 2 tablets (50 mg total) by mouth 3 (three) times daily. Patient taking differently: Take 50 mg by mouth 2 (two) times daily.  12/17/15  Yes [name], [name], MD  metoprolol (LOPRESSOR) 50 MG tablet Take 1.5 tablets (75 mg total) by mouth 2 (two) times daily. 09/15/16  Yes [name], [name], MD  polyethylene glycol [facility] Floria Raveling) packet Take 17 g by mouth daily as needed. Patient taking differently: Take 17 g by mouth daily as needed for mild constipation.  06/02/17  Yes [name], MD  sucroferric oxyhydroxide [facility]) 500 MG chewable tablet Chew 1,000 mg by mouth 3 (three) times daily with meals.   Yes [provider]   Current Facility-Administered Medications  Medication Dose Route Frequency Provider  Last Rate Last Dose  . albuterol (PROVENTIL) (2.5 MG/3ML) 0.083% nebulizer solution 2.5 mg  2.5 mg Nebulization Q6H PRN Smith, Rondell A, MD      . aspirin chewable tablet 81 mg  81 mg Oral Daily Vilma Prader, MD   81 mg at 08/24/17 0827  . calcitRIOL (ROCALTROL) capsule 0.25 mcg  0.25 mcg Oral Daily Vilma Prader, MD   0.25 mcg at 08/24/17 0827  . calcium acetate (PHOSLO) capsule 1,334 mg  1,334 mg Oral TID WC Vilma Prader, MD   1,334 mg at 08/24/17 0827  . feeding supplement  (NEPRO CARB STEADY) liquid 237 mL  237 mL Oral BID BM Raiford Noble Sebring, DO   237 mL at 08/24/17 3419  . guaiFENesin-dextromethorphan (ROBITUSSIN DM) 100-10 MG/5ML syrup 5 mL  5 mL Oral Q4H PRN Vilma Prader, MD   5 mL at 08/24/17 0020  . heparin injection 5,000 Units  5,000 Units Subcutaneous Q8H Vilma Prader, MD   5,000 Units at 08/24/17 0600  . hydrALAZINE (APRESOLINE) injection 10 mg  10 mg Intravenous Once Bodenheimer, Charles A, NP      . hydrALAZINE (APRESOLINE) tablet 50 mg  50 mg Oral Q8H Vilma Prader, MD   50 mg at 08/24/17 0226  . labetalol (NORMODYNE,TRANDATE) injection 10 mg  10 mg Intravenous Q2H PRN Fuller Plan A, MD   10 mg at 08/24/17 0255  . metoprolol tartrate (LOPRESSOR) tablet 75 mg  75 mg Oral BID Vilma Prader, MD   75 mg at 08/24/17 0411  . polyethylene glycol (MIRALAX / GLYCOLAX) packet 17 g  17 g Oral Daily PRN Vilma Prader, MD      . sodium chloride flush (NS) 0.9 % injection 3 mL  3 mL Intravenous Q12H Vilma Prader, MD   3 mL at 08/24/17 6222   Facility-Administered Medications Ordered in Other Encounters  Medication Dose Route Frequency Provider Last Rate Last Dose  . methylPREDNISolone acetate (DEPO-MEDROL) injection 40 mg  40 mg Intra-articular Once [name], [name], MD       Labs: Basic Metabolic Panel: Recent Labs  Lab 08/23/17 2132 08/23/17 2135 08/24/17 0357  NA 139 140 139  K 4.2 4.2 4.1  CL 107 110 107  CO2 12*  --  13*  GLUCOSE 100* 98 108*  BUN 138* 128* 136*  CREATININE 13.10* 13.20* 12.99*  CALCIUM 9.0  --  9.3  PHOS  --   --  6.5*   Liver Function Tests: Recent Labs  Lab 08/23/17 2132 08/24/17 0357  AST 10* 10*  ALT 16* 15*  ALKPHOS 87 83  BILITOT 0.9 0.9  PROT 6.3* 6.1*  ALBUMIN 3.3* 3.1*   No results for input(s): LIPASE, AMYLASE in the last 168 hours. No results for input(s): AMMONIA in the last 168 hours. CBC: Recent Labs  Lab 08/23/17 2132 08/23/17 2135 08/24/17 0357  WBC 4.3  --   3.8*  HGB 7.1* 6.8* 6.7*  HCT 21.0* 20.0* 19.7*  MCV 98.6  --  98.5  PLT 151  --  146*   Cardiac Enzymes: Recent Labs  Lab 08/23/17 2132 08/24/17 0357  TROPONINI 0.10* 0.11*   CBG: No results for input(s): GLUCAP in the last 168 hours. Iron Studies: No results for input(s): IRON, TIBC, TRANSFERRIN, FERRITIN in the last 72 hours. Studies/Results: Dg Chest 2 View  Result Date: 08/23/2017 CLINICAL DATA:  Cough and cold 1 week.  No dialysis for 1 week. EXAM: CHEST  2 VIEW COMPARISON:  12/05/2016 FINDINGS: Right IJ dialysis catheter has tip over the SVC. Lungs are adequately inflated without focal airspace consolidation or effusion. There is mild prominence of the perihilar markings suggesting mild vascular congestion. Cardiomediastinal silhouette and remainder of the exam is unchanged. IMPRESSION: Findings suggesting mild vascular congestion. Electronically Signed   By: Marin Olp M.D.   On: 08/23/2017 19:27    ROS: As per HPI otherwise negative.  Physical Exam: Vitals:   08/24/17 0205 08/24/17 0306 08/24/17 0323 08/24/17 0735  BP: (!) 210/90 (!) 196/92 (!) 147/97 (!) 175/97  Pulse: 99  96 85  Resp: (!) 24  (!) 23 16  Temp:   97.6 F (36.4 C) 97.9 F (36.6 C)  TempSrc:   Oral Oral  SpO2: 96%  94% 98%  Weight:   58 kg (127 lb 13.9 oz)   Height:   5' 7.5" (1.715 m)      General: well appearing thin male in no acute distress. Head: Normocephalic, atraumatic, sclera non-icteric, mucus membranes are moist Neck: Supple. JVD not elevated. Lungs: Clear bilaterally to auscultation without wheezes, rales, or rhonchi. Breathing is unlabored. Heart: RRR with S1 S2. No murmurs, rubs, or gallops appreciated. Abdomen: Soft, non-tender, non-distended with normoactive bowel sounds. No rebound/guarding. No obvious abdominal masses. M-S:  Strength and tone appear normal for age. Lower extremities:without edema or ischemic changes, no open wounds  Neuro: Alert and oriented X 3. Moves all  extremities spontaneously. Psych:  Responds to questions appropriately with a normal affect. Dialysis Access: LFA AVF maturing + bruit RIJ TDC. Can use AVF.   Dialysis: [facility] TTS 3h 20min  58kg   3K/2.25 bath  Hep 1800   R IJ cath/ LFA AVF maturing (can use AVF) 180 NRe 400/800 -Venofer 100 mg IV X 10 (Rec'd one dose) -Mircera 225 mcg IV q 2 weeks (last dose 07/30/2017  Assessment/Plan: 1.  Noncompliance with HD: HD today on schedule.  2.  Cough: Per primary Respiratory panel pending. H/O Asthma.  3.  ESRD - T, Th, S. Will use AVF 17 g needles. K+ 4.1 2.0 K bath. Hold heparin until results from FOBT.  4.  Hypertension/volume  - Very hypertensive. Supposed to be on metoprolol 75 mg PO BID and        Amlodipine 10 mg PO q hs. Doubt he has been taking meds. Will resume meds. Patient is currently at EDW. Consider challenging 0.5 today to see BP can be lowered.   5.  Anemia  - No ESA since November, did not receive Fe load. Transfuse 2 units PRBCs today. Resume ESA and Fe load.  6.  Metabolic bone disease - Renal function added to labs. Continue binders/VDRA. 7.  Nutrition - Albumin 3.3 renal diet. Add nepro/renal vits.   Rita H. Owens Shark, NP-C 08/24/2017, 10:01 AM  [employer] [phone]  Pt seen, examined and agree w A/P as above. ESRD pt started HD only 2 mos ago, didn't go to HD for about 1-2 weeks because of cramping at HD and still making urine.  Here with tremors, jerking, diarrhea, ^^BP and azotemia, needs HD.  Volume is ok. Plan HD today.  [name] MD [employer] pager [phone]   08/24/2017, 4:56 PM

## 2017-08-24 NOTE — ED Notes (Signed)
Pt's O2 sat 89-92% on room air. Pt placed on 2 L [place], O2 sat increased to 96%

## 2017-08-24 NOTE — Procedures (Signed)
   I was present at this dialysis session, have reviewed the session itself and made  appropriate changes [name] MD [place] pager [phone]   08/24/2017, 4:57 PM

## 2017-08-25 LAB — TYPE AND SCREEN
ABO/RH(D): A POS
ANTIBODY SCREEN: NEGATIVE
UNIT DIVISION: 0
UNIT DIVISION: 0

## 2017-08-25 LAB — BPAM RBC
BLOOD PRODUCT EXPIRATION DATE: 201812312359
BLOOD PRODUCT EXPIRATION DATE: 201901192359
ISSUE DATE / TIME: 201812241414
ISSUE DATE / TIME: 201812241454
UNIT TYPE AND RH: 600
UNIT TYPE AND RH: 6200

## 2017-08-27 DIAGNOSIS — D631 Anemia in chronic kidney disease: Secondary | ICD-10-CM | POA: Diagnosis not present

## 2017-08-27 DIAGNOSIS — N186 End stage renal disease: Secondary | ICD-10-CM | POA: Diagnosis not present

## 2017-08-27 DIAGNOSIS — E876 Hypokalemia: Secondary | ICD-10-CM | POA: Diagnosis not present

## 2017-08-27 DIAGNOSIS — N2581 Secondary hyperparathyroidism of renal origin: Secondary | ICD-10-CM | POA: Diagnosis not present

## 2017-08-29 LAB — CULTURE, BLOOD (ROUTINE X 2)
CULTURE: NO GROWTH
Culture: NO GROWTH
Special Requests: ADEQUATE
Special Requests: ADEQUATE

## 2017-08-30 NOTE — Discharge Summary (Signed)
Physician Discharge Summary  Thomas Robinson BDZ:[id number] DOB: 1961-03-11 DOA: 08/23/2017  PCP: Patient, No Pcp Per  Admit date: 08/23/2017 Discharge date: 08/30/2017  Admitted From: Home Disposition:  Left Against Medical Advice  Recommendations for Outpatient Follow-up:  1. Follow up with PCP in 1-2 weeks 2. Please obtain CMP/CBC, Mag, Phos in one week  Home Health: No Equipment/Devices: None    Discharge Condition: Guarded, Left AMA CODE STATUS: FULL CODE Diet recommendation: Renal Diet  Brief/Interim Summary: The patient is a 56 year old Caucasian male with a PMH of new ESRD on HD TTSat, HTN, Hx of CVA, HLD, Anemia of Chronic Kidney Disease, and other comorbids who presented to the ED with worsening SOB. States his last HD was over a week ago. Hb/Hct was noted to be 6.7/19.7 on the morning of admission and patient underwent Dialysis along with transfusion of 2 units of pRBC's. FOBT collected and was Negative though patient is having dark black stools. Patient's Cough/SOB is slightly improved and RSV was negative. Subsequently after he was dialyzed he signed out Against Medical Advice and Left the Hospital.  Discharge Diagnoses:  Active Problems:   Hypertensive urgency   Cough  Acute hypoxic respiratory failure 2/2 to volume overload from ESRD -Patient with a report of cough, orthopnea and diagnostics revealing elevated BNP, new oxygen requirement in the setting of non-adherence to dialysis for over 1 week. -Provide  trial of furosemide 120 mg IV once as a bridge until dialysis in AM -Dialysis diet with fluid restriction -phos binder + calcitriol -Consult nephrology Dialysis, consider HD strategies that may mitigate muscle cramping to encourage patient adherence -supplemental O2 as needed -trend Mg and Phos -Patient was dialyzed and transfused and subsequently signed out Against Medical Advice in the evening after dialysis   HTN:  -SBP elevated likely related to volume  overload,  -will continue with home BB, hydralazine, anticipate improvement with HD sessions -Patient signed out AMA  Normocytic Anemia:  -likely related to ESRD,  -Transfused 2 units in Dialysi  -Elevated troponin:  -possibly demand in setting of volume overload and ESRD -continue to trend, if develops CP EKG  Elevated CV risk:  -continue ASA -Patient signed out AMA  Cough with generalized myalgias and reported pain at catheter site:  -Blood culture  Showed NGTD at 5 days + RVP (Negative -Patient Signed out [facility]  Discharge Instructions  Allergies as of 08/24/2017   No Known Allergies     Medication List    ASK your doctor about these medications   acetaminophen 500 MG tablet Commonly known as:  TYLENOL Take 1,000 mg by mouth every 4 (four) hours as needed for mild pain or headache. Reported on 12/17/2015   albuterol 108 (90 Base) MCG/ACT inhaler Commonly known as:  PROVENTIL HFA;VENTOLIN HFA Inhale 1-2 puffs into the lungs every 6 (six) hours as needed for wheezing or shortness of breath.   aspirin 81 MG chewable tablet Chew 1 tablet (81 mg total) by mouth daily.   hydrALAZINE 25 MG tablet Commonly known as:  APRESOLINE Take 2 tablets (50 mg total) by mouth 3 (three) times daily.   metoprolol tartrate 50 MG tablet Commonly known as:  LOPRESSOR Take 1.5 tablets (75 mg total) by mouth 2 (two) times daily.   polyethylene glycol packet Commonly known as:  MIRALAX / GLYCOLAX Take 17 g by mouth daily as needed.   VELPHORO 500 MG chewable tablet Generic drug:  sucroferric oxyhydroxide Chew 1,000 mg by mouth 3 (three) times daily  with meals.   ZZZQUIL 50 MG/30ML Liqd Generic drug:  DiphenhydrAMINE HCl Take 30 mLs by mouth at bedtime as needed (for sleep).       No Known Allergies  Consultations:  Nephrology  Procedures/Studies: Dg Chest 2 View  Result Date: 08/23/2017 CLINICAL DATA:  Cough and cold 1 week.  No dialysis for 1 week. EXAM: CHEST  2 VIEW  COMPARISON:  12/05/2016 FINDINGS: Right IJ dialysis catheter has tip over the SVC. Lungs are adequately inflated without focal airspace consolidation or effusion. There is mild prominence of the perihilar markings suggesting mild vascular congestion. Cardiomediastinal silhouette and remainder of the exam is unchanged. IMPRESSION: Findings suggesting mild vascular congestion. Electronically Signed   By: Marin Olp M.D.   On: 08/23/2017 19:27     Subjective: Patient Left AMA on the EVENING of 08/24/17 after Dialysis.  Discharge Exam: Vitals:   08/24/17 1645 08/24/17 1800  BP: (!) 155/90   Pulse: 93 95  Resp: 19 13  Temp: 98.4 F (36.9 C)   SpO2: 98% 100%   Vitals:   08/24/17 1615 08/24/17 1630 08/24/17 1645 08/24/17 1800  BP: (!) 167/92 (!) 163/96 (!) 155/90   Pulse: 93 89 93 95  Resp:   19 13  Temp:   98.4 F (36.9 C)   TempSrc:   Oral   SpO2:   98% 100%  Weight:   55.6 kg (122 lb 9.2 oz)   Height:        DID NOT EXAMINE PATIENT AS HE LEFT AMA IN THE EVENING  The results of significant diagnostics from this hospitalization (including imaging, microbiology, ancillary and laboratory) are listed below for reference.    Microbiology: Recent Results (from the past 240 hour(s))  Respiratory Panel by PCR     Status: None   Collection Time: 08/24/17  1:45 AM  Result Value Ref Range Status   Adenovirus NOT DETECTED NOT DETECTED Final   Coronavirus 229E NOT DETECTED NOT DETECTED Final   Coronavirus HKU1 NOT DETECTED NOT DETECTED Final   Coronavirus NL63 NOT DETECTED NOT DETECTED Final   Coronavirus OC43 NOT DETECTED NOT DETECTED Final   Metapneumovirus NOT DETECTED NOT DETECTED Final   Rhinovirus / Enterovirus NOT DETECTED NOT DETECTED Final   Influenza A NOT DETECTED NOT DETECTED Final   Influenza B NOT DETECTED NOT DETECTED Final   Parainfluenza Virus 1 NOT DETECTED NOT DETECTED Final   Parainfluenza Virus 2 NOT DETECTED NOT DETECTED Final   Parainfluenza Virus 3 NOT  DETECTED NOT DETECTED Final   Parainfluenza Virus 4 NOT DETECTED NOT DETECTED Final   Respiratory Syncytial Virus NOT DETECTED NOT DETECTED Final   Bordetella pertussis NOT DETECTED NOT DETECTED Final   Chlamydophila pneumoniae NOT DETECTED NOT DETECTED Final   Mycoplasma pneumoniae NOT DETECTED NOT DETECTED Final  Culture, blood (routine x 2)     Status: None   Collection Time: 08/24/17  3:50 AM  Result Value Ref Range Status   Specimen Description BLOOD RIGHT ANTECUBITAL  Final   Special Requests IN PEDIATRIC BOTTLE Blood Culture adequate volume  Final   Culture NO GROWTH 5 DAYS  Final   Report Status 08/29/2017 FINAL  Final  Culture, blood (routine x 2)     Status: None   Collection Time: 08/24/17  3:57 AM  Result Value Ref Range Status   Specimen Description BLOOD RIGHT ANTECUBITAL  Final   Special Requests IN PEDIATRIC BOTTLE Blood Culture adequate volume  Final   Culture NO GROWTH 5 DAYS  Final   Report Status 08/29/2017 FINAL  Final  MRSA PCR Screening     Status: None   Collection Time: 08/24/17  4:16 AM  Result Value Ref Range Status   MRSA by PCR NEGATIVE NEGATIVE Final    Comment:        The GeneXpert MRSA Assay (FDA approved for NASAL specimens only), is one component of a comprehensive MRSA colonization surveillance program. It is not intended to diagnose MRSA infection nor to guide or monitor treatment for MRSA infections.    Labs: BNP (last 3 results) Recent Labs    08/23/17 2132  BNP 1,017.5*   Basic Metabolic Panel: Recent Labs  Lab 08/24/17 0357 08/24/17 1857  NA 139 139  K 4.1 3.7  CL 107 101  CO2 13* 25  GLUCOSE 108* 115*  BUN 136* 44*  CREATININE 12.99* 5.82*  CALCIUM 9.3 9.1  MG 2.7*  --   PHOS 6.5* 3.7   Liver Function Tests: Recent Labs  Lab 08/24/17 0357 08/24/17 1857  AST 10*  --   ALT 15*  --   ALKPHOS 83  --   BILITOT 0.9  --   PROT 6.1*  --   ALBUMIN 3.1* 3.1*   No results for input(s): LIPASE, AMYLASE in the last 168  hours. No results for input(s): AMMONIA in the last 168 hours. CBC: Recent Labs  Lab 08/24/17 0357 08/24/17 1330 08/24/17 1857  WBC 3.8* 2.8* 4.5  HGB 6.7* 6.2* 9.7*  HCT 19.7* 18.2* 28.1*  MCV 98.5 98.4 92.4  PLT 146* 148* 164   Cardiac Enzymes: Recent Labs  Lab 08/24/17 0357  TROPONINI 0.11*   BNP: Invalid input(s): POCBNP CBG: Recent Labs  Lab 08/24/17 1121  GLUCAP 111*   D-Dimer No results for input(s): DDIMER in the last 72 hours. Hgb A1c No results for input(s): HGBA1C in the last 72 hours. Lipid Profile No results for input(s): CHOL, HDL, LDLCALC, TRIG, CHOLHDL, LDLDIRECT in the last 72 hours. Thyroid function studies No results for input(s): TSH, T4TOTAL, T3FREE, THYROIDAB in the last 72 hours.  Invalid input(s): FREET3 Anemia work up No results for input(s): VITAMINB12, FOLATE, FERRITIN, TIBC, IRON, RETICCTPCT in the last 72 hours. Urinalysis    Component Value Date/Time   COLORURINE YELLOW 05/24/2017 1729   APPEARANCEUR HAZY (A) 05/24/2017 1729   LABSPEC 1.011 05/24/2017 1729   PHURINE 5.0 05/24/2017 1729   GLUCOSEU 50 (A) 05/24/2017 1729   HGBUR SMALL (A) 05/24/2017 1729   BILIRUBINUR NEGATIVE 05/24/2017 1729   KETONESUR NEGATIVE 05/24/2017 1729   PROTEINUR 100 (A) 05/24/2017 1729   UROBILINOGEN 0.2 04/09/2015 1527   NITRITE NEGATIVE 05/24/2017 1729   LEUKOCYTESUR TRACE (A) 05/24/2017 1729   Sepsis Labs Invalid input(s): PROCALCITONIN,  WBC,  LACTICIDVEN Microbiology Recent Results (from the past 240 hour(s))  Respiratory Panel by PCR     Status: None   Collection Time: 08/24/17  1:45 AM  Result Value Ref Range Status   Adenovirus NOT DETECTED NOT DETECTED Final   Coronavirus 229E NOT DETECTED NOT DETECTED Final   Coronavirus HKU1 NOT DETECTED NOT DETECTED Final   Coronavirus NL63 NOT DETECTED NOT DETECTED Final   Coronavirus OC43 NOT DETECTED NOT DETECTED Final   Metapneumovirus NOT DETECTED NOT DETECTED Final   Rhinovirus / Enterovirus  NOT DETECTED NOT DETECTED Final   Influenza A NOT DETECTED NOT DETECTED Final   Influenza B NOT DETECTED NOT DETECTED Final   Parainfluenza Virus 1 NOT DETECTED NOT DETECTED Final   Parainfluenza  Virus 2 NOT DETECTED NOT DETECTED Final   Parainfluenza Virus 3 NOT DETECTED NOT DETECTED Final   Parainfluenza Virus 4 NOT DETECTED NOT DETECTED Final   Respiratory Syncytial Virus NOT DETECTED NOT DETECTED Final   Bordetella pertussis NOT DETECTED NOT DETECTED Final   Chlamydophila pneumoniae NOT DETECTED NOT DETECTED Final   Mycoplasma pneumoniae NOT DETECTED NOT DETECTED Final  Culture, blood (routine x 2)     Status: None   Collection Time: 08/24/17  3:50 AM  Result Value Ref Range Status   Specimen Description BLOOD RIGHT ANTECUBITAL  Final   Special Requests IN PEDIATRIC BOTTLE Blood Culture adequate volume  Final   Culture NO GROWTH 5 DAYS  Final   Report Status 08/29/2017 FINAL  Final  Culture, blood (routine x 2)     Status: None   Collection Time: 08/24/17  3:57 AM  Result Value Ref Range Status   Specimen Description BLOOD RIGHT ANTECUBITAL  Final   Special Requests IN PEDIATRIC BOTTLE Blood Culture adequate volume  Final   Culture NO GROWTH 5 DAYS  Final   Report Status 08/29/2017 FINAL  Final  MRSA PCR Screening     Status: None   Collection Time: 08/24/17  4:16 AM  Result Value Ref Range Status   MRSA by PCR NEGATIVE NEGATIVE Final    Comment:        The GeneXpert MRSA Assay (FDA approved for NASAL specimens only), is one component of a comprehensive MRSA colonization surveillance program. It is not intended to diagnose MRSA infection nor to guide or monitor treatment for MRSA infections.    Time coordinating discharge: 25 minutes  SIGNED:  Kerney Elbe, DO [facility] 08/30/2017, 11:29 PM Pager [phone]  If 7PM-7AM, please contact night-coverage [email or web address] Password [email or web address]

## 2017-08-31 DIAGNOSIS — D631 Anemia in chronic kidney disease: Secondary | ICD-10-CM | POA: Diagnosis not present

## 2017-08-31 DIAGNOSIS — E876 Hypokalemia: Secondary | ICD-10-CM | POA: Diagnosis not present

## 2017-08-31 DIAGNOSIS — N186 End stage renal disease: Secondary | ICD-10-CM | POA: Diagnosis not present

## 2017-08-31 DIAGNOSIS — N2581 Secondary hyperparathyroidism of renal origin: Secondary | ICD-10-CM | POA: Diagnosis not present

## 2017-08-31 DIAGNOSIS — Z992 Dependence on renal dialysis: Secondary | ICD-10-CM | POA: Diagnosis not present

## 2017-08-31 DIAGNOSIS — I120 Hypertensive chronic kidney disease with stage 5 chronic kidney disease or end stage renal disease: Secondary | ICD-10-CM | POA: Diagnosis not present

## 2017-09-02 DIAGNOSIS — D631 Anemia in chronic kidney disease: Secondary | ICD-10-CM | POA: Diagnosis not present

## 2017-09-02 DIAGNOSIS — A4150 Gram-negative sepsis, unspecified: Secondary | ICD-10-CM | POA: Diagnosis not present

## 2017-09-02 DIAGNOSIS — E875 Hyperkalemia: Secondary | ICD-10-CM | POA: Diagnosis not present

## 2017-09-02 DIAGNOSIS — E876 Hypokalemia: Secondary | ICD-10-CM | POA: Diagnosis not present

## 2017-09-02 DIAGNOSIS — N2581 Secondary hyperparathyroidism of renal origin: Secondary | ICD-10-CM | POA: Diagnosis not present

## 2017-09-02 DIAGNOSIS — N186 End stage renal disease: Secondary | ICD-10-CM | POA: Diagnosis not present

## 2017-09-03 DIAGNOSIS — N186 End stage renal disease: Secondary | ICD-10-CM | POA: Diagnosis not present

## 2017-09-03 DIAGNOSIS — D631 Anemia in chronic kidney disease: Secondary | ICD-10-CM | POA: Diagnosis not present

## 2017-09-03 DIAGNOSIS — E875 Hyperkalemia: Secondary | ICD-10-CM | POA: Diagnosis not present

## 2017-09-03 DIAGNOSIS — N2581 Secondary hyperparathyroidism of renal origin: Secondary | ICD-10-CM | POA: Diagnosis not present

## 2017-09-03 DIAGNOSIS — E876 Hypokalemia: Secondary | ICD-10-CM | POA: Diagnosis not present

## 2017-09-03 DIAGNOSIS — A4150 Gram-negative sepsis, unspecified: Secondary | ICD-10-CM | POA: Diagnosis not present

## 2017-09-05 DIAGNOSIS — A4150 Gram-negative sepsis, unspecified: Secondary | ICD-10-CM

## 2017-09-05 HISTORY — DX: Gram-negative sepsis, unspecified: A41.50

## 2017-09-08 DIAGNOSIS — D631 Anemia in chronic kidney disease: Secondary | ICD-10-CM | POA: Diagnosis not present

## 2017-09-08 DIAGNOSIS — N2581 Secondary hyperparathyroidism of renal origin: Secondary | ICD-10-CM | POA: Diagnosis not present

## 2017-09-08 DIAGNOSIS — E876 Hypokalemia: Secondary | ICD-10-CM | POA: Diagnosis not present

## 2017-09-08 DIAGNOSIS — A4150 Gram-negative sepsis, unspecified: Secondary | ICD-10-CM | POA: Diagnosis not present

## 2017-09-08 DIAGNOSIS — N186 End stage renal disease: Secondary | ICD-10-CM | POA: Diagnosis not present

## 2017-09-08 DIAGNOSIS — E875 Hyperkalemia: Secondary | ICD-10-CM | POA: Diagnosis not present

## 2017-09-09 DIAGNOSIS — D509 Iron deficiency anemia, unspecified: Secondary | ICD-10-CM

## 2017-09-09 DIAGNOSIS — Z452 Encounter for adjustment and management of vascular access device: Secondary | ICD-10-CM | POA: Diagnosis not present

## 2017-09-09 HISTORY — DX: Iron deficiency anemia, unspecified: D50.9

## 2017-09-10 DIAGNOSIS — N2581 Secondary hyperparathyroidism of renal origin: Secondary | ICD-10-CM | POA: Diagnosis not present

## 2017-09-10 DIAGNOSIS — A4150 Gram-negative sepsis, unspecified: Secondary | ICD-10-CM | POA: Diagnosis not present

## 2017-09-10 DIAGNOSIS — E876 Hypokalemia: Secondary | ICD-10-CM | POA: Diagnosis not present

## 2017-09-10 DIAGNOSIS — D631 Anemia in chronic kidney disease: Secondary | ICD-10-CM | POA: Diagnosis not present

## 2017-09-10 DIAGNOSIS — E875 Hyperkalemia: Secondary | ICD-10-CM | POA: Diagnosis not present

## 2017-09-10 DIAGNOSIS — N186 End stage renal disease: Secondary | ICD-10-CM | POA: Diagnosis not present

## 2017-09-15 DIAGNOSIS — A4150 Gram-negative sepsis, unspecified: Secondary | ICD-10-CM | POA: Diagnosis not present

## 2017-09-15 DIAGNOSIS — E876 Hypokalemia: Secondary | ICD-10-CM | POA: Diagnosis not present

## 2017-09-15 DIAGNOSIS — E875 Hyperkalemia: Secondary | ICD-10-CM | POA: Diagnosis not present

## 2017-09-15 DIAGNOSIS — N2581 Secondary hyperparathyroidism of renal origin: Secondary | ICD-10-CM | POA: Diagnosis not present

## 2017-09-15 DIAGNOSIS — N186 End stage renal disease: Secondary | ICD-10-CM | POA: Diagnosis not present

## 2017-09-15 DIAGNOSIS — D631 Anemia in chronic kidney disease: Secondary | ICD-10-CM | POA: Diagnosis not present

## 2017-09-19 DIAGNOSIS — E875 Hyperkalemia: Secondary | ICD-10-CM | POA: Diagnosis not present

## 2017-09-19 DIAGNOSIS — E876 Hypokalemia: Secondary | ICD-10-CM | POA: Diagnosis not present

## 2017-09-19 DIAGNOSIS — N186 End stage renal disease: Secondary | ICD-10-CM | POA: Diagnosis not present

## 2017-09-19 DIAGNOSIS — A4150 Gram-negative sepsis, unspecified: Secondary | ICD-10-CM | POA: Diagnosis not present

## 2017-09-19 DIAGNOSIS — D631 Anemia in chronic kidney disease: Secondary | ICD-10-CM | POA: Diagnosis not present

## 2017-09-19 DIAGNOSIS — N2581 Secondary hyperparathyroidism of renal origin: Secondary | ICD-10-CM | POA: Diagnosis not present

## 2017-09-22 DIAGNOSIS — E876 Hypokalemia: Secondary | ICD-10-CM | POA: Diagnosis not present

## 2017-09-22 DIAGNOSIS — A4150 Gram-negative sepsis, unspecified: Secondary | ICD-10-CM | POA: Diagnosis not present

## 2017-09-22 DIAGNOSIS — N2581 Secondary hyperparathyroidism of renal origin: Secondary | ICD-10-CM | POA: Diagnosis not present

## 2017-09-22 DIAGNOSIS — E875 Hyperkalemia: Secondary | ICD-10-CM | POA: Diagnosis not present

## 2017-09-22 DIAGNOSIS — N186 End stage renal disease: Secondary | ICD-10-CM | POA: Diagnosis not present

## 2017-09-22 DIAGNOSIS — D631 Anemia in chronic kidney disease: Secondary | ICD-10-CM | POA: Diagnosis not present

## 2017-09-23 ENCOUNTER — Other Ambulatory Visit: Payer: Self-pay | Admitting: Nephrology

## 2017-09-23 ENCOUNTER — Ambulatory Visit
Admission: RE | Admit: 2017-09-23 | Discharge: 2017-09-23 | Disposition: A | Discharge status: Home / Self Care | Payer: Medicaid Other | Source: Ambulatory Visit | Attending: Nephrology | Admitting: Nephrology

## 2017-09-23 DIAGNOSIS — R0781 Pleurodynia: Secondary | ICD-10-CM

## 2017-09-23 DIAGNOSIS — R059 Cough, unspecified: Secondary | ICD-10-CM

## 2017-09-23 DIAGNOSIS — R05 Cough: Secondary | ICD-10-CM

## 2017-09-26 DIAGNOSIS — N186 End stage renal disease: Secondary | ICD-10-CM | POA: Diagnosis not present

## 2017-09-26 DIAGNOSIS — E876 Hypokalemia: Secondary | ICD-10-CM | POA: Diagnosis not present

## 2017-09-26 DIAGNOSIS — N2581 Secondary hyperparathyroidism of renal origin: Secondary | ICD-10-CM | POA: Diagnosis not present

## 2017-09-26 DIAGNOSIS — D631 Anemia in chronic kidney disease: Secondary | ICD-10-CM | POA: Diagnosis not present

## 2017-09-26 DIAGNOSIS — A4150 Gram-negative sepsis, unspecified: Secondary | ICD-10-CM | POA: Diagnosis not present

## 2017-09-26 DIAGNOSIS — E875 Hyperkalemia: Secondary | ICD-10-CM | POA: Diagnosis not present

## 2017-09-29 DIAGNOSIS — E876 Hypokalemia: Secondary | ICD-10-CM | POA: Diagnosis not present

## 2017-09-29 DIAGNOSIS — N186 End stage renal disease: Secondary | ICD-10-CM | POA: Diagnosis not present

## 2017-09-29 DIAGNOSIS — A4150 Gram-negative sepsis, unspecified: Secondary | ICD-10-CM | POA: Diagnosis not present

## 2017-09-29 DIAGNOSIS — E875 Hyperkalemia: Secondary | ICD-10-CM | POA: Diagnosis not present

## 2017-09-29 DIAGNOSIS — D631 Anemia in chronic kidney disease: Secondary | ICD-10-CM | POA: Diagnosis not present

## 2017-09-29 DIAGNOSIS — N2581 Secondary hyperparathyroidism of renal origin: Secondary | ICD-10-CM | POA: Diagnosis not present

## 2017-10-01 DIAGNOSIS — Z992 Dependence on renal dialysis: Secondary | ICD-10-CM | POA: Diagnosis not present

## 2017-10-01 DIAGNOSIS — N186 End stage renal disease: Secondary | ICD-10-CM | POA: Diagnosis not present

## 2017-10-01 DIAGNOSIS — I120 Hypertensive chronic kidney disease with stage 5 chronic kidney disease or end stage renal disease: Secondary | ICD-10-CM | POA: Diagnosis not present

## 2017-10-02 DIAGNOSIS — N186 End stage renal disease: Secondary | ICD-10-CM | POA: Diagnosis not present

## 2017-10-02 DIAGNOSIS — I120 Hypertensive chronic kidney disease with stage 5 chronic kidney disease or end stage renal disease: Secondary | ICD-10-CM | POA: Diagnosis not present

## 2017-10-02 DIAGNOSIS — Z992 Dependence on renal dialysis: Secondary | ICD-10-CM | POA: Diagnosis not present

## 2017-10-03 DIAGNOSIS — D509 Iron deficiency anemia, unspecified: Secondary | ICD-10-CM | POA: Diagnosis not present

## 2017-10-03 DIAGNOSIS — N186 End stage renal disease: Secondary | ICD-10-CM | POA: Diagnosis not present

## 2017-10-03 DIAGNOSIS — A4150 Gram-negative sepsis, unspecified: Secondary | ICD-10-CM | POA: Diagnosis not present

## 2017-10-03 DIAGNOSIS — N2581 Secondary hyperparathyroidism of renal origin: Secondary | ICD-10-CM | POA: Diagnosis not present

## 2017-10-03 DIAGNOSIS — D631 Anemia in chronic kidney disease: Secondary | ICD-10-CM | POA: Diagnosis not present

## 2017-10-03 DIAGNOSIS — E876 Hypokalemia: Secondary | ICD-10-CM | POA: Diagnosis not present

## 2017-10-06 DIAGNOSIS — A4150 Gram-negative sepsis, unspecified: Secondary | ICD-10-CM | POA: Diagnosis not present

## 2017-10-06 DIAGNOSIS — D631 Anemia in chronic kidney disease: Secondary | ICD-10-CM | POA: Diagnosis not present

## 2017-10-06 DIAGNOSIS — D509 Iron deficiency anemia, unspecified: Secondary | ICD-10-CM | POA: Diagnosis not present

## 2017-10-06 DIAGNOSIS — N186 End stage renal disease: Secondary | ICD-10-CM | POA: Diagnosis not present

## 2017-10-06 DIAGNOSIS — N2581 Secondary hyperparathyroidism of renal origin: Secondary | ICD-10-CM | POA: Diagnosis not present

## 2017-10-06 DIAGNOSIS — E876 Hypokalemia: Secondary | ICD-10-CM | POA: Diagnosis not present

## 2017-10-10 DIAGNOSIS — D509 Iron deficiency anemia, unspecified: Secondary | ICD-10-CM | POA: Diagnosis not present

## 2017-10-10 DIAGNOSIS — E876 Hypokalemia: Secondary | ICD-10-CM | POA: Diagnosis not present

## 2017-10-10 DIAGNOSIS — A4150 Gram-negative sepsis, unspecified: Secondary | ICD-10-CM | POA: Diagnosis not present

## 2017-10-10 DIAGNOSIS — N2581 Secondary hyperparathyroidism of renal origin: Secondary | ICD-10-CM | POA: Diagnosis not present

## 2017-10-10 DIAGNOSIS — N186 End stage renal disease: Secondary | ICD-10-CM | POA: Diagnosis not present

## 2017-10-10 DIAGNOSIS — D631 Anemia in chronic kidney disease: Secondary | ICD-10-CM | POA: Diagnosis not present

## 2017-10-12 DIAGNOSIS — A4150 Gram-negative sepsis, unspecified: Secondary | ICD-10-CM | POA: Diagnosis not present

## 2017-10-12 DIAGNOSIS — E876 Hypokalemia: Secondary | ICD-10-CM | POA: Diagnosis not present

## 2017-10-12 DIAGNOSIS — N186 End stage renal disease: Secondary | ICD-10-CM | POA: Diagnosis not present

## 2017-10-12 DIAGNOSIS — N2581 Secondary hyperparathyroidism of renal origin: Secondary | ICD-10-CM | POA: Diagnosis not present

## 2017-10-12 DIAGNOSIS — D509 Iron deficiency anemia, unspecified: Secondary | ICD-10-CM | POA: Diagnosis not present

## 2017-10-12 DIAGNOSIS — D631 Anemia in chronic kidney disease: Secondary | ICD-10-CM | POA: Diagnosis not present

## 2017-10-15 DIAGNOSIS — E876 Hypokalemia: Secondary | ICD-10-CM | POA: Diagnosis not present

## 2017-10-15 DIAGNOSIS — A4150 Gram-negative sepsis, unspecified: Secondary | ICD-10-CM | POA: Diagnosis not present

## 2017-10-15 DIAGNOSIS — D631 Anemia in chronic kidney disease: Secondary | ICD-10-CM | POA: Diagnosis not present

## 2017-10-15 DIAGNOSIS — N2581 Secondary hyperparathyroidism of renal origin: Secondary | ICD-10-CM | POA: Diagnosis not present

## 2017-10-15 DIAGNOSIS — D509 Iron deficiency anemia, unspecified: Secondary | ICD-10-CM | POA: Diagnosis not present

## 2017-10-15 DIAGNOSIS — N186 End stage renal disease: Secondary | ICD-10-CM | POA: Diagnosis not present

## 2017-10-17 DIAGNOSIS — N186 End stage renal disease: Secondary | ICD-10-CM | POA: Diagnosis not present

## 2017-10-17 DIAGNOSIS — D509 Iron deficiency anemia, unspecified: Secondary | ICD-10-CM | POA: Diagnosis not present

## 2017-10-17 DIAGNOSIS — D631 Anemia in chronic kidney disease: Secondary | ICD-10-CM | POA: Diagnosis not present

## 2017-10-17 DIAGNOSIS — N2581 Secondary hyperparathyroidism of renal origin: Secondary | ICD-10-CM | POA: Diagnosis not present

## 2017-10-17 DIAGNOSIS — A4150 Gram-negative sepsis, unspecified: Secondary | ICD-10-CM | POA: Diagnosis not present

## 2017-10-17 DIAGNOSIS — E876 Hypokalemia: Secondary | ICD-10-CM | POA: Diagnosis not present

## 2017-10-20 DIAGNOSIS — N186 End stage renal disease: Secondary | ICD-10-CM | POA: Diagnosis not present

## 2017-10-20 DIAGNOSIS — A4150 Gram-negative sepsis, unspecified: Secondary | ICD-10-CM | POA: Diagnosis not present

## 2017-10-20 DIAGNOSIS — D509 Iron deficiency anemia, unspecified: Secondary | ICD-10-CM | POA: Diagnosis not present

## 2017-10-20 DIAGNOSIS — E876 Hypokalemia: Secondary | ICD-10-CM | POA: Diagnosis not present

## 2017-10-20 DIAGNOSIS — D631 Anemia in chronic kidney disease: Secondary | ICD-10-CM | POA: Diagnosis not present

## 2017-10-20 DIAGNOSIS — N2581 Secondary hyperparathyroidism of renal origin: Secondary | ICD-10-CM | POA: Diagnosis not present

## 2017-10-21 ENCOUNTER — Other Ambulatory Visit: Payer: Self-pay | Admitting: Nephrology

## 2017-10-21 ENCOUNTER — Ambulatory Visit
Admission: RE | Admit: 2017-10-21 | Discharge: 2017-10-21 | Disposition: A | Discharge status: Home / Self Care | Payer: Medicaid Other | Source: Ambulatory Visit | Attending: Nephrology | Admitting: Nephrology

## 2017-10-21 DIAGNOSIS — Z09 Encounter for follow-up examination after completed treatment for conditions other than malignant neoplasm: Secondary | ICD-10-CM

## 2017-10-22 DIAGNOSIS — A4150 Gram-negative sepsis, unspecified: Secondary | ICD-10-CM | POA: Diagnosis not present

## 2017-10-22 DIAGNOSIS — N2581 Secondary hyperparathyroidism of renal origin: Secondary | ICD-10-CM | POA: Diagnosis not present

## 2017-10-22 DIAGNOSIS — N186 End stage renal disease: Secondary | ICD-10-CM | POA: Diagnosis not present

## 2017-10-22 DIAGNOSIS — D631 Anemia in chronic kidney disease: Secondary | ICD-10-CM | POA: Diagnosis not present

## 2017-10-22 DIAGNOSIS — E876 Hypokalemia: Secondary | ICD-10-CM | POA: Diagnosis not present

## 2017-10-22 DIAGNOSIS — D509 Iron deficiency anemia, unspecified: Secondary | ICD-10-CM | POA: Diagnosis not present

## 2017-10-23 ENCOUNTER — Inpatient Hospital Stay (HOSPITAL_COMMUNITY)
Admission: EM | Admit: 2017-10-23 | Discharge: 2017-10-25 | DRG: 682 | Disposition: A | Discharge status: Home / Self Care | Payer: Medicare Other | Attending: Internal Medicine | Admitting: Internal Medicine

## 2017-10-23 ENCOUNTER — Emergency Department (HOSPITAL_COMMUNITY): Payer: Medicare Other

## 2017-10-23 ENCOUNTER — Other Ambulatory Visit: Payer: Self-pay

## 2017-10-23 ENCOUNTER — Encounter (HOSPITAL_COMMUNITY): Payer: Self-pay

## 2017-10-23 DIAGNOSIS — J189 Pneumonia, unspecified organism: Secondary | ICD-10-CM

## 2017-10-23 DIAGNOSIS — N2581 Secondary hyperparathyroidism of renal origin: Secondary | ICD-10-CM | POA: Diagnosis not present

## 2017-10-23 DIAGNOSIS — J9600 Acute respiratory failure, unspecified whether with hypoxia or hypercapnia: Secondary | ICD-10-CM | POA: Diagnosis not present

## 2017-10-23 DIAGNOSIS — N185 Chronic kidney disease, stage 5: Secondary | ICD-10-CM | POA: Diagnosis not present

## 2017-10-23 DIAGNOSIS — I10 Essential (primary) hypertension: Secondary | ICD-10-CM | POA: Diagnosis not present

## 2017-10-23 DIAGNOSIS — Z79899 Other long term (current) drug therapy: Secondary | ICD-10-CM

## 2017-10-23 DIAGNOSIS — D631 Anemia in chronic kidney disease: Secondary | ICD-10-CM | POA: Diagnosis present

## 2017-10-23 DIAGNOSIS — Z992 Dependence on renal dialysis: Secondary | ICD-10-CM

## 2017-10-23 DIAGNOSIS — N186 End stage renal disease: Secondary | ICD-10-CM | POA: Diagnosis present

## 2017-10-23 DIAGNOSIS — I120 Hypertensive chronic kidney disease with stage 5 chronic kidney disease or end stage renal disease: Secondary | ICD-10-CM | POA: Diagnosis not present

## 2017-10-23 DIAGNOSIS — Z9114 Patient's other noncompliance with medication regimen: Secondary | ICD-10-CM | POA: Diagnosis not present

## 2017-10-23 DIAGNOSIS — Z9115 Patient's noncompliance with renal dialysis: Secondary | ICD-10-CM

## 2017-10-23 DIAGNOSIS — Z7982 Long term (current) use of aspirin: Secondary | ICD-10-CM | POA: Diagnosis not present

## 2017-10-23 DIAGNOSIS — R0602 Shortness of breath: Secondary | ICD-10-CM | POA: Diagnosis not present

## 2017-10-23 DIAGNOSIS — Z8673 Personal history of transient ischemic attack (TIA), and cerebral infarction without residual deficits: Secondary | ICD-10-CM | POA: Diagnosis not present

## 2017-10-23 DIAGNOSIS — J45901 Unspecified asthma with (acute) exacerbation: Secondary | ICD-10-CM | POA: Diagnosis not present

## 2017-10-23 DIAGNOSIS — R069 Unspecified abnormalities of breathing: Secondary | ICD-10-CM | POA: Diagnosis not present

## 2017-10-23 DIAGNOSIS — E785 Hyperlipidemia, unspecified: Secondary | ICD-10-CM | POA: Diagnosis present

## 2017-10-23 DIAGNOSIS — R079 Chest pain, unspecified: Secondary | ICD-10-CM | POA: Diagnosis not present

## 2017-10-23 DIAGNOSIS — I160 Hypertensive urgency: Secondary | ICD-10-CM | POA: Diagnosis not present

## 2017-10-23 DIAGNOSIS — J9601 Acute respiratory failure with hypoxia: Secondary | ICD-10-CM | POA: Diagnosis present

## 2017-10-23 DIAGNOSIS — E8889 Other specified metabolic disorders: Secondary | ICD-10-CM | POA: Diagnosis present

## 2017-10-23 DIAGNOSIS — E8770 Fluid overload, unspecified: Secondary | ICD-10-CM

## 2017-10-23 DIAGNOSIS — I6330 Cerebral infarction due to thrombosis of unspecified cerebral artery: Secondary | ICD-10-CM | POA: Diagnosis present

## 2017-10-23 DIAGNOSIS — J45909 Unspecified asthma, uncomplicated: Secondary | ICD-10-CM | POA: Diagnosis present

## 2017-10-23 DIAGNOSIS — J90 Pleural effusion, not elsewhere classified: Secondary | ICD-10-CM | POA: Diagnosis present

## 2017-10-23 DIAGNOSIS — I272 Pulmonary hypertension, unspecified: Secondary | ICD-10-CM | POA: Insufficient documentation

## 2017-10-23 DIAGNOSIS — R05 Cough: Secondary | ICD-10-CM | POA: Diagnosis not present

## 2017-10-23 HISTORY — DX: Unspecified asthma with (acute) exacerbation: J45.901

## 2017-10-23 HISTORY — DX: Fluid overload, unspecified: E87.70

## 2017-10-23 HISTORY — DX: Pneumonia, unspecified organism: J18.9

## 2017-10-23 LAB — I-STAT TROPONIN, ED: Troponin i, poc: 0.04 ng/mL (ref 0.00–0.08)

## 2017-10-23 LAB — BASIC METABOLIC PANEL
Anion gap: 18 — ABNORMAL HIGH (ref 5–15)
BUN: 75 mg/dL — ABNORMAL HIGH (ref 6–20)
CO2: 19 mmol/L — ABNORMAL LOW (ref 22–32)
Calcium: 9.1 mg/dL (ref 8.9–10.3)
Chloride: 104 mmol/L (ref 101–111)
Creatinine, Ser: 7.87 mg/dL — ABNORMAL HIGH (ref 0.61–1.24)
GFR calc Af Amer: 8 mL/min — ABNORMAL LOW (ref >60)
GFR calc non Af Amer: 7 mL/min — ABNORMAL LOW (ref >60)
Glucose, Bld: 108 mg/dL — ABNORMAL HIGH (ref 65–99)
Potassium: 5.1 mmol/L (ref 3.5–5.1)
Sodium: 141 mmol/L (ref 135–145)

## 2017-10-23 LAB — CBC WITH DIFFERENTIAL/PLATELET
Basophils Absolute: 0.1 10*3/uL (ref 0.0–0.1)
Basophils Relative: 1 %
Eosinophils Absolute: 0.1 10*3/uL (ref 0.0–0.7)
Eosinophils Relative: 2 %
HCT: 23.7 % — ABNORMAL LOW (ref 39.0–52.0)
Hemoglobin: 7.5 g/dL — ABNORMAL LOW (ref 13.0–17.0)
Lymphocytes Relative: 12 %
Lymphs Abs: 0.8 10*3/uL (ref 0.7–4.0)
MCH: 30.6 pg (ref 26.0–34.0)
MCHC: 31.6 g/dL (ref 30.0–36.0)
MCV: 96.7 fL (ref 78.0–100.0)
Monocytes Absolute: 0.4 10*3/uL (ref 0.1–1.0)
Monocytes Relative: 6 %
Neutro Abs: 5.1 10*3/uL (ref 1.7–7.7)
Neutrophils Relative %: 79 %
Platelets: 202 10*3/uL (ref 150–400)
RBC: 2.45 MIL/uL — ABNORMAL LOW (ref 4.22–5.81)
RDW: 21.8 % — ABNORMAL HIGH (ref 11.5–15.5)
WBC: 6.5 10*3/uL (ref 4.0–10.5)

## 2017-10-23 MED ORDER — VANCOMYCIN HCL IN DEXTROSE 1-5 GM/200ML-% IV SOLN
1000.0000 mg | Freq: Once | INTRAVENOUS | Status: AC
  Administered 2017-10-23: 1000 mg via INTRAVENOUS
  Filled 2017-10-23: qty 200

## 2017-10-23 MED ORDER — LEVOFLOXACIN IN D5W 750 MG/150ML IV SOLN: 750.0000 mg | Freq: Once | INTRAVENOUS | Status: DC

## 2017-10-23 MED ORDER — FUROSEMIDE 10 MG/ML IJ SOLN
80.0000 mg | Freq: Once | INTRAMUSCULAR | Status: AC
  Administered 2017-10-23: 80 mg via INTRAVENOUS
  Filled 2017-10-23: qty 8

## 2017-10-23 MED ORDER — ALBUTEROL SULFATE (2.5 MG/3ML) 0.083% IN NEBU
5.0000 mg | INHALATION_SOLUTION | Freq: Once | RESPIRATORY_TRACT | Status: AC
  Administered 2017-10-23: 5 mg via RESPIRATORY_TRACT
  Filled 2017-10-23: qty 6

## 2017-10-23 MED ORDER — HYDRALAZINE HCL 20 MG/ML IJ SOLN
20.0000 mg | Freq: Once | INTRAMUSCULAR | Status: AC
  Administered 2017-10-23: 20 mg via INTRAVENOUS
  Filled 2017-10-23: qty 1

## 2017-10-23 MED ORDER — SODIUM CHLORIDE 0.9 % IV SOLN
2.0000 g | Freq: Once | INTRAVENOUS | Status: AC
  Administered 2017-10-23: 2 g via INTRAVENOUS
  Filled 2017-10-23: qty 2

## 2017-10-23 MED ORDER — SODIUM CHLORIDE 0.9 % IV SOLN: 1.0000 g | INTRAVENOUS | Status: DC

## 2017-10-23 MED ORDER — LEVOFLOXACIN IN D5W 500 MG/100ML IV SOLN: 500.0000 mg | INTRAVENOUS | Status: DC

## 2017-10-23 NOTE — ED Notes (Signed)
ED Provider at bedside. 
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
## 2017-10-23 NOTE — Progress Notes (Signed)
Pharmacy Antibiotic Note  Thomas Robinson is a 57 y.o. male admitted on 10/23/2017 with pneumonia.  Pharmacy has been consulted for vancomycin and cefepime dosing.  ESRD on HD  Plan: Give cefepime 2g IV x 1, then start cefepime 1g IV Q24h tomorrow Give vancomycin 1g IV x 1, then follow up HD schedule Monitor clinical picture, renal function, VT prn F/U C&S, abx deescalation / LOT  Height: 5\' 7"  (170.2 cm) Weight: 125 lb 10.6 oz (57 kg) IBW/kg (Calculated) : 66.1  Temp (24hrs), Avg:97.5 F (36.4 C), Min:97.5 F (36.4 C), Max:97.5 F (36.4 C)  No results for input(s): WBC, CREATININE, LATICACIDVEN, VANCOTROUGH, VANCOPEAK, VANCORANDOM, GENTTROUGH, GENTPEAK, GENTRANDOM, TOBRATROUGH, TOBRAPEAK, TOBRARND, AMIKACINPEAK, AMIKACINTROU, AMIKACIN in the last 168 hours.  CrCl cannot be calculated (Patient's most recent lab result is older than the maximum 21 days allowed.).    No Known Allergies  Thank you for allowing pharmacy to be a part of this patient's care.  Reginia Naas 10/23/2017 7:18 PM

## 2017-10-24 DIAGNOSIS — J9600 Acute respiratory failure, unspecified whether with hypoxia or hypercapnia: Secondary | ICD-10-CM

## 2017-10-24 DIAGNOSIS — N185 Chronic kidney disease, stage 5: Secondary | ICD-10-CM

## 2017-10-24 DIAGNOSIS — J45901 Unspecified asthma with (acute) exacerbation: Secondary | ICD-10-CM

## 2017-10-24 DIAGNOSIS — I10 Essential (primary) hypertension: Secondary | ICD-10-CM

## 2017-10-24 DIAGNOSIS — R0602 Shortness of breath: Secondary | ICD-10-CM

## 2017-10-24 DIAGNOSIS — J189 Pneumonia, unspecified organism: Secondary | ICD-10-CM

## 2017-10-24 DIAGNOSIS — I160 Hypertensive urgency: Secondary | ICD-10-CM

## 2017-10-24 LAB — IRON AND TIBC
IRON: 14 ug/dL — AB (ref 45–182)
Saturation Ratios: 5 % — ABNORMAL LOW (ref 17.9–39.5)
TIBC: 270 ug/dL (ref 250–450)
UIBC: 256 ug/dL

## 2017-10-24 LAB — CBC
HCT: 25.9 % — ABNORMAL LOW (ref 39.0–52.0)
HEMOGLOBIN: 8.1 g/dL — AB (ref 13.0–17.0)
MCH: 30.5 pg (ref 26.0–34.0)
MCHC: 31.3 g/dL (ref 30.0–36.0)
MCV: 97.4 fL (ref 78.0–100.0)
Platelets: 214 10*3/uL (ref 150–400)
RBC: 2.66 MIL/uL — ABNORMAL LOW (ref 4.22–5.81)
RDW: 22.1 % — AB (ref 11.5–15.5)
WBC: 7.5 10*3/uL (ref 4.0–10.5)

## 2017-10-24 LAB — BASIC METABOLIC PANEL
ANION GAP: 18 — AB (ref 5–15)
BUN: 77 mg/dL — ABNORMAL HIGH (ref 6–20)
CALCIUM: 8.9 mg/dL (ref 8.9–10.3)
CO2: 18 mmol/L — ABNORMAL LOW (ref 22–32)
CREATININE: 7.97 mg/dL — AB (ref 0.61–1.24)
Chloride: 102 mmol/L (ref 101–111)
GFR calc non Af Amer: 7 mL/min — ABNORMAL LOW (ref >60)
GFR, EST AFRICAN AMERICAN: 8 mL/min — AB (ref >60)
Glucose, Bld: 110 mg/dL — ABNORMAL HIGH (ref 65–99)
Potassium: 5.1 mmol/L (ref 3.5–5.1)
SODIUM: 138 mmol/L (ref 135–145)

## 2017-10-24 LAB — FERRITIN: Ferritin: 343 ng/mL — ABNORMAL HIGH (ref 24–336)

## 2017-10-24 LAB — VITAMIN B12: Vitamin B-12: 526 pg/mL (ref 180–914)

## 2017-10-24 LAB — INFLUENZA PANEL BY PCR (TYPE A & B)
Influenza A By PCR: NEGATIVE
Influenza B By PCR: NEGATIVE

## 2017-10-24 MED ORDER — DOXERCALCIFEROL 4 MCG/2ML IV SOLN
5.0000 ug | INTRAVENOUS | Status: DC
  Administered 2017-10-24: 5 ug via INTRAVENOUS

## 2017-10-24 MED ORDER — DARBEPOETIN ALFA 200 MCG/0.4ML IJ SOSY: 200.0000 ug | PREFILLED_SYRINGE | INTRAMUSCULAR | Status: DC

## 2017-10-24 MED ORDER — NA FERRIC GLUC CPLX IN SUCROSE 12.5 MG/ML IV SOLN
125.0000 mg | INTRAVENOUS | Status: DC
  Administered 2017-10-24: 125 mg via INTRAVENOUS
  Filled 2017-10-24: qty 10

## 2017-10-24 MED ORDER — HEPARIN SODIUM (PORCINE) 5000 UNIT/ML IJ SOLN
5000.0000 [IU] | Freq: Three times a day (TID) | INTRAMUSCULAR | Status: DC
  Administered 2017-10-24: 5000 [IU] via SUBCUTANEOUS
  Filled 2017-10-24: qty 1

## 2017-10-24 MED ORDER — HYDRALAZINE HCL 25 MG PO TABS
50.0000 mg | ORAL_TABLET | Freq: Three times a day (TID) | ORAL | Status: DC
  Administered 2017-10-24: 50 mg via ORAL
  Filled 2017-10-24: qty 2

## 2017-10-24 MED ORDER — PREDNISONE 20 MG PO TABS
40.0000 mg | ORAL_TABLET | Freq: Every day | ORAL | Status: DC
  Administered 2017-10-24: 40 mg via ORAL
  Filled 2017-10-24: qty 2

## 2017-10-24 MED ORDER — POLYETHYLENE GLYCOL 3350 17 G PO PACK: 17.0000 g | PACK | Freq: Every day | ORAL | Status: DC | PRN

## 2017-10-24 MED ORDER — DOXERCALCIFEROL 4 MCG/2ML IV SOLN
INTRAVENOUS | Status: AC
  Filled 2017-10-24: qty 4

## 2017-10-24 MED ORDER — SUCROFERRIC OXYHYDROXIDE 500 MG PO CHEW
1000.0000 mg | CHEWABLE_TABLET | Freq: Three times a day (TID) | ORAL | Status: DC
  Administered 2017-10-24: 1000 mg via ORAL
  Filled 2017-10-24 (×4): qty 2

## 2017-10-24 MED ORDER — LEVOFLOXACIN 750 MG PO TABS: 750.0000 mg | ORAL_TABLET | Freq: Every day | ORAL | 0 refills | Status: AC

## 2017-10-24 MED ORDER — METOPROLOL TARTRATE 25 MG PO TABS
75.0000 mg | ORAL_TABLET | Freq: Two times a day (BID) | ORAL | Status: DC
  Administered 2017-10-24 (×2): 75 mg via ORAL
  Filled 2017-10-24 (×2): qty 3

## 2017-10-24 MED ORDER — HYDRALAZINE HCL 20 MG/ML IJ SOLN: 10.0000 mg | INTRAMUSCULAR | Status: DC | PRN

## 2017-10-24 MED ORDER — ACETAMINOPHEN 500 MG PO TABS: 500.0000 mg | ORAL_TABLET | ORAL | 0 refills | Status: DC | PRN

## 2017-10-24 MED ORDER — ASPIRIN 81 MG PO CHEW
81.0000 mg | CHEWABLE_TABLET | Freq: Every day | ORAL | Status: DC
  Administered 2017-10-24: 81 mg via ORAL
  Filled 2017-10-24: qty 1

## 2017-10-24 MED ORDER — ALBUTEROL SULFATE (2.5 MG/3ML) 0.083% IN NEBU
2.5000 mg | INHALATION_SOLUTION | Freq: Four times a day (QID) | RESPIRATORY_TRACT | Status: DC
  Administered 2017-10-24 (×2): 2.5 mg via RESPIRATORY_TRACT
  Filled 2017-10-24 (×2): qty 3

## 2017-10-24 NOTE — Discharge Instructions (Signed)
Please make sure you stay for complete dialysis sessions, since the water build up can be dangerous for your breathing.   Please take the antibiotics for 6 more days (sent [facility] your [facility])

## 2017-10-24 NOTE — Consult Note (Addendum)
[name] Renal Consultation Note  Indication for Consultation:  Management of ESRD/hemodialysis; anemia, hypertension/volume and secondary hyperparathyroidism  HPI: Thomas Robinson is a 57 y.o. male with  ESRD 2/2 HTN  Hd start [name] 05/2017, HTN Urgency [facility] 9/23-10/02/18,Hyperlipidemia, HO CVA, ASTHMA , ho Non Compliance with Meds/ HD visits . Admitted with Hypoxic Resp Failure in setting of missed HD Thurs 2/21. (AVF  Infiltrated and no show for Offered Friday op HD )  Admit cxr =bilateral interstitial edema or infiltrates. 2. Small asymmetric pleural effusions left greater than right. Noted at OP unit ( [name]) edw was lowered past week 57.5 kg to 55 kg and tolerating  No reported fevers, chills, N/v/D / Chest pain.   Noted dose has Scheduled Fistulogram at [name] center 10/27/27 11;15 am to eval fistula . Currently no pain or sweling in AVf / Breathing improved after Nebulizer tx in ER       Past Medical History:  Diagnosis Date  . Asthma   . Chronic kidney disease   . Hyperlipidemia   . Hypertension   . Stroke ([place])   . Symptomatic anemia 05/24/2017    Past Surgical History:  Procedure Laterality Date  . AV FISTULA PLACEMENT Left 05/28/2017   Procedure: LEFT ARM ARTERIOVENOUS (AV) FISTULA CREATION;  Surgeon: [name], MD;  Location: [place];  Service: Vascular;  Laterality: Left;  . IR FLUORO GUIDE CV LINE RIGHT  05/25/2017  . IR US GUIDE VASC ACCESS RIGHT  05/25/2017  . RINOPLASTY        Family History  Problem Relation Age of Onset  . Cancer Mother       reports that  has never smoked. he has never used smokeless tobacco. He reports that he does not drink alcohol or use drugs.  No Known Allergies  Prior to Admission medications   Medication Sig Start Date End Date Taking? Authorizing Provider  acetaminophen (TYLENOL) 500 MG tablet Take 1,000 mg by mouth every 4 (four) hours as needed for mild pain or headache. Reported on 12/17/2015   Yes [provider]  albuterol (PROVENTIL HFA;VENTOLIN HFA) 108 (90 BASE) MCG/ACT inhaler Inhale 1-2 puffs into the lungs every 6 (six) hours as needed for wheezing or shortness of breath. 04/12/15  Yes [name], MD  aspirin 81 MG chewable tablet Chew 1 tablet (81 mg total) by mouth daily. 04/12/15  Yes [name], MD  DiphenhydrAMINE HCl (ZZZQUIL) 50 MG/30ML LIQD Take 30 mLs by mouth at bedtime as needed (for sleep).   Yes [provider]  hydrALAZINE (APRESOLINE) 25 MG tablet Take 2 tablets (50 mg total) by mouth 3 (three) times daily. Patient taking differently: Take 50 mg by mouth 2 (two) times daily.  12/17/15  Yes [name], [name], MD  metoprolol (LOPRESSOR) 50 MG tablet Take 1.5 tablets (75 mg total) by mouth 2 (two) times daily. 09/15/16  Yes [name], [name], MD  polyethylene glycol [facility] / Floria Raveling) packet Take 17 g by mouth daily as needed. Patient taking differently: Take 17 g by mouth daily as needed for mild constipation.  06/02/17  Yes [name], MD  sucroferric oxyhydroxide [facility]) 500 MG chewable tablet Chew 1,000 mg by mouth 3 (three) times daily with meals.   Yes [provider]     Anti-infectives (From admission, onward)   Start     Dose/Rate Route Frequency Ordered Stop   10/27/17 1500  levofloxacin (LEVAQUIN) IVPB 500 mg  500 mg 100 mL/hr over 60 Minutes Intravenous Every 48 hours 10/23/17 2345     10/24/17 2100  ceFEPIme (MAXIPIME) 1 g in sodium chloride 0.9 % 100 mL IVPB  Status:  Discontinued     1 g 200 mL/hr over 30 Minutes Intravenous Every 24 hours 10/23/17 2010 10/23/17 2345   10/24/17 1500  levofloxacin (LEVAQUIN) IVPB 750 mg     750 mg 100 mL/hr over 90 Minutes Intravenous  Once 10/23/17 2345     10/23/17 1930  ceFEPIme (MAXIPIME) 2 g in sodium chloride 0.9 % 100 mL IVPB     2 g 200 mL/hr over 30 Minutes Intravenous  Once 10/23/17 1917 10/23/17 2030   10/23/17 1930  vancomycin (VANCOCIN) IVPB 1000 mg/200 mL  premix     1,000 mg 200 mL/hr over 60 Minutes Intravenous  Once 10/23/17 1917 10/23/17 2205      Results for orders placed or performed during the hospital encounter of 10/23/17 (from the past 48 hour(s))  Basic metabolic panel     Status: Abnormal   Collection Time: 10/23/17  6:35 PM  Result Value Ref Range   Sodium 141 135 - 145 mmol/L   Potassium 5.1 3.5 - 5.1 mmol/L   Chloride 104 101 - 111 mmol/L   CO2 19 (L) 22 - 32 mmol/L   Glucose, Bld 108 (H) 65 - 99 mg/dL   BUN 75 (H) 6 - 20 mg/dL   Creatinine, Ser 7.87 (H) 0.61 - 1.24 mg/dL   Calcium 9.1 8.9 - 10.3 mg/dL   GFR calc non Af Amer 7 (L) >60 mL/min   GFR calc Af Amer 8 (L) >60 mL/min    Comment: (NOTE) The eGFR has been calculated using the CKD EPI equation. This calculation has not been validated in all clinical situations. eGFR's persistently <60 mL/min signify possible Chronic Kidney Disease.    Anion gap 18 (H) 5 - 15    Comment: Performed at [facility], [name] [name], [name]  CBC with Differential     Status: Abnormal   Collection Time: 10/23/17  6:35 PM  Result Value Ref Range   WBC 6.5 4.0 - 10.5 K/uL   RBC 2.45 (L) 4.22 - 5.81 MIL/uL   Hemoglobin 7.5 (L) 13.0 - 17.0 g/dL   HCT 23.7 (L) 39.0 - 52.0 %   MCV 96.7 78.0 - 100.0 fL   MCH 30.6 26.0 - 34.0 pg   MCHC 31.6 30.0 - 36.0 g/dL   RDW 21.8 (H) 11.5 - 15.5 %   Platelets 202 150 - 400 K/uL   Neutrophils Relative % 79 %   Lymphocytes Relative 12 %   Monocytes Relative 6 %   Eosinophils Relative 2 %   Basophils Relative 1 %   Neutro Abs 5.1 1.7 - 7.7 K/uL   Lymphs Abs 0.8 0.7 - 4.0 K/uL   Monocytes Absolute 0.4 0.1 - 1.0 K/uL   Eosinophils Absolute 0.1 0.0 - 0.7 K/uL   Basophils Absolute 0.1 0.0 - 0.1 K/uL   RBC Morphology ELLIPTOCYTES     Comment: Performed at [name] [place], [place] [address]  I-stat troponin, ED     Status: None   Collection Time: 10/23/17  6:57 PM  Result Value Ref Range    Troponin i, poc 0.04 0.00 - 0.08 ng/mL   Comment 3            Comment: Due to the release kinetics of  cTnI, a negative result within the first hours of the onset of symptoms does not rule out myocardial infarction with certainty. If myocardial infarction is still suspected, repeat the test at appropriate intervals.   Influenza panel by PCR (type A & B)     Status: None   Collection Time: 10/24/17  1:46 AM  Result Value Ref Range   Influenza A By PCR NEGATIVE NEGATIVE   Influenza B By PCR NEGATIVE NEGATIVE    Comment: (NOTE) The Xpert Xpress Flu assay is intended as an aid in the diagnosis of  influenza and should not be used as a sole basis for treatment.  This  assay is FDA approved for nasopharyngeal swab specimens only. Nasal  washings and aspirates are unacceptable for Xpert Xpress Flu testing. Performed at [facility], [place] [address] [place], [place] [address]   CBC     Status: Abnormal   Collection Time: 10/24/17  2:02 AM  Result Value Ref Range   WBC 7.5 4.0 - 10.5 K/uL   RBC 2.66 (L) 4.22 - 5.81 MIL/uL   Hemoglobin 8.1 (L) 13.0 - 17.0 g/dL   HCT 25.9 (L) 39.0 - 52.0 %   MCV 97.4 78.0 - 100.0 fL   MCH 30.5 26.0 - 34.0 pg   MCHC 31.3 30.0 - 36.0 g/dL   RDW 22.1 (H) 11.5 - 15.5 %   Platelets 214 150 - 400 K/uL    Comment: Performed at [facility], [place] [address] [place], [place] [address]  Basic metabolic panel     Status: Abnormal   Collection Time: 10/24/17  2:02 AM  Result Value Ref Range   Sodium 138 135 - 145 mmol/L   Potassium 5.1 3.5 - 5.1 mmol/L   Chloride 102 101 - 111 mmol/L   CO2 18 (L) 22 - 32 mmol/L   Glucose, Bld 110 (H) 65 - 99 mg/dL   BUN 77 (H) 6 - 20 mg/dL   Creatinine, Ser 7.97 (H) 0.61 - 1.24 mg/dL   Calcium 8.9 8.9 - 10.3 mg/dL   GFR calc non Af Amer 7 (L) >60 mL/min   GFR calc Af Amer 8 (L) >60 mL/min    Comment: (NOTE) The eGFR has been calculated using the CKD EPI equation. This calculation has not been validated in all  clinical situations. eGFR's persistently <60 mL/min signify possible Chronic Kidney Disease.    Anion gap 18 (H) 5 - 15    Comment: Performed at [facility], [place] [address] [place], [place] [address]  Iron and TIBC     Status: Abnormal   Collection Time: 10/24/17  2:02 AM  Result Value Ref Range   Iron 14 (L) 45 - 182 ug/dL   TIBC 270 250 - 450 ug/dL   Saturation Ratios 5 (L) 17.9 - 39.5 %   UIBC 256 ug/dL    Comment: Performed at [facility], [place] [address] [place], [place] [address]  Ferritin     Status: Abnormal   Collection Time: 10/24/17  2:02 AM  Result Value Ref Range   Ferritin 343 (H) 24 - 336 ng/mL    Comment: Performed at [facility], [place] [address] [place], [place] [address]  Vitamin B12     Status: None   Collection Time: 10/24/17  2:02 AM  Result Value Ref Range   Vitamin B-12 526 180 - 914 pg/mL    Comment: (NOTE) This assay is not validated for testing  neonatal or myeloproliferative syndrome specimens for Vitamin B12 levels. Performed at [facility], [place] [address] [place], [place] [address]    ROS: see hpi   Physical Exam: Vitals:   10/24/17 0830 10/24/17 1000  BP: (!) 202/94 (!) 196/102  Pulse: 92   Resp: (!) 28   Temp:    SpO2: 97%      General: thin small in stature  WM NAD sitting in chair in ER  , Appropriate   HEENT: NXC , EOMI, MMM Non icteric  Neck: no jvd Heart: RRR, No m, g, r Lungs: CTA  bilat  Unlabored breathing with 2 l o2 [place] Abdomen: bs =+, soft , NT, ND Extremities: bilat 1+ pedal edema Skin: No overt rash Neuro: Alert OX3, no acute focal deficits  Dialysis Access: Pos bruit LFA AVF  No swelling or erythema  Dialysis Orders: [facility]: [facility] . EDW 55 kg (lowered this wk)HD [place] 2k, 2.25ca  Time 3hr 17m  Heparin 1800. Access LFA AVF      Hec 5 mcg IV/HD Mircera 225 mcg q 2wks ( last on 10/12/17)   Venofer  100 mg  Load ( q tx last on 11/05/17)    Assessment/Plan 1. Hypoxic resp failure with ho  ASTHMA /Volume overload/ missed hd /recent lowering  edw as op /neb. Per admit  ( has at Home)  2. ESRD -  HD TTS ( AVf difficult stick  Last tx has fistulogram scheduled op =[facility] center 10/26/17 11;15 am)  3. Hypertension/volume  - Bp ^ sec vol ?? Med compliance , decr edw  Fu wts bp . Home bp meds  4. HO Asthma - meds per admit  5. Anemia  - HGB 8.1   ESA on 2/25 200 araneps / rion load through 11/05/17  6. Metabolic bone disease -  Phos binder and hec on hd    DErnest Haber PA-C [id number]/23/2019, 10:35 AM   Pt seen, examined, agree w assess/plan as above with additions as indicated. ESRD pt on HD x 6 months, here with SOB, cough. Imaging shows sig pulm edema and bilat pleural effusions.  On exam sig LE edema.  Obvious fluid overload, only 4kg up from dry but needs lower dry wt.  Getting 4kg off today.  Pt feeling better and on room air, and adamant about going home.  I recommended he stay to get some more fluid off but he is not interested.  OK for dc , will try to lower dry wt in OP setting.  RKelly SplinterMD CNewell Rubbermaidpager [phone]   cell [phone]/23/2019, 4:19 PM
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
## 2017-10-24 NOTE — ED Notes (Signed)
Pt given ice chips and sandwich bad

## 2017-10-24 NOTE — Progress Notes (Signed)
Pt was discharged home after Dialysis tx per MD order. Pt is alert and responsive and not in any acute distress. Pt escorted to the car.

## 2017-10-24 NOTE — Procedures (Signed)
   I was present at this dialysis session, have reviewed the session itself and made  appropriate changes [name] MD [place] pager [phone]   10/24/2017, 4:21 PM

## 2017-10-26 DIAGNOSIS — N186 End stage renal disease: Secondary | ICD-10-CM | POA: Diagnosis not present

## 2017-10-26 DIAGNOSIS — T82858A Stenosis of vascular prosthetic devices, implants and grafts, initial encounter: Secondary | ICD-10-CM | POA: Diagnosis not present

## 2017-10-26 DIAGNOSIS — I871 Compression of vein: Secondary | ICD-10-CM | POA: Diagnosis not present

## 2017-10-26 DIAGNOSIS — Z992 Dependence on renal dialysis: Secondary | ICD-10-CM | POA: Diagnosis not present

## 2017-10-27 DIAGNOSIS — N2581 Secondary hyperparathyroidism of renal origin: Secondary | ICD-10-CM | POA: Diagnosis not present

## 2017-10-27 DIAGNOSIS — A4150 Gram-negative sepsis, unspecified: Secondary | ICD-10-CM | POA: Diagnosis not present

## 2017-10-27 DIAGNOSIS — D631 Anemia in chronic kidney disease: Secondary | ICD-10-CM | POA: Diagnosis not present

## 2017-10-27 DIAGNOSIS — D509 Iron deficiency anemia, unspecified: Secondary | ICD-10-CM | POA: Diagnosis not present

## 2017-10-27 DIAGNOSIS — N186 End stage renal disease: Secondary | ICD-10-CM | POA: Diagnosis not present

## 2017-10-27 DIAGNOSIS — E876 Hypokalemia: Secondary | ICD-10-CM | POA: Diagnosis not present

## 2017-10-30 DIAGNOSIS — Z992 Dependence on renal dialysis: Secondary | ICD-10-CM | POA: Diagnosis not present

## 2017-10-30 DIAGNOSIS — N186 End stage renal disease: Secondary | ICD-10-CM | POA: Diagnosis not present

## 2017-10-30 DIAGNOSIS — I120 Hypertensive chronic kidney disease with stage 5 chronic kidney disease or end stage renal disease: Secondary | ICD-10-CM | POA: Diagnosis not present

## 2017-10-31 DIAGNOSIS — E876 Hypokalemia: Secondary | ICD-10-CM | POA: Diagnosis not present

## 2017-10-31 DIAGNOSIS — N186 End stage renal disease: Secondary | ICD-10-CM | POA: Diagnosis not present

## 2017-10-31 DIAGNOSIS — N2581 Secondary hyperparathyroidism of renal origin: Secondary | ICD-10-CM | POA: Diagnosis not present

## 2017-10-31 DIAGNOSIS — Z23 Encounter for immunization: Secondary | ICD-10-CM | POA: Diagnosis not present

## 2017-10-31 DIAGNOSIS — D631 Anemia in chronic kidney disease: Secondary | ICD-10-CM | POA: Diagnosis not present

## 2017-10-31 DIAGNOSIS — D509 Iron deficiency anemia, unspecified: Secondary | ICD-10-CM | POA: Diagnosis not present

## 2017-11-03 DIAGNOSIS — N186 End stage renal disease: Secondary | ICD-10-CM | POA: Diagnosis not present

## 2017-11-03 DIAGNOSIS — N2581 Secondary hyperparathyroidism of renal origin: Secondary | ICD-10-CM | POA: Diagnosis not present

## 2017-11-03 DIAGNOSIS — Z23 Encounter for immunization: Secondary | ICD-10-CM | POA: Diagnosis not present

## 2017-11-03 DIAGNOSIS — D631 Anemia in chronic kidney disease: Secondary | ICD-10-CM | POA: Diagnosis not present

## 2017-11-03 DIAGNOSIS — D509 Iron deficiency anemia, unspecified: Secondary | ICD-10-CM | POA: Diagnosis not present

## 2017-11-03 DIAGNOSIS — E876 Hypokalemia: Secondary | ICD-10-CM | POA: Diagnosis not present

## 2017-11-04 ENCOUNTER — Ambulatory Visit: Payer: Medicare Other | Attending: Family Medicine | Admitting: Physician Assistant

## 2017-11-04 DIAGNOSIS — D649 Anemia, unspecified: Secondary | ICD-10-CM | POA: Diagnosis not present

## 2017-11-04 DIAGNOSIS — N186 End stage renal disease: Secondary | ICD-10-CM | POA: Diagnosis not present

## 2017-11-04 DIAGNOSIS — Z7982 Long term (current) use of aspirin: Secondary | ICD-10-CM | POA: Diagnosis not present

## 2017-11-04 DIAGNOSIS — Z992 Dependence on renal dialysis: Secondary | ICD-10-CM | POA: Insufficient documentation

## 2017-11-04 DIAGNOSIS — R002 Palpitations: Secondary | ICD-10-CM | POA: Diagnosis not present

## 2017-11-04 DIAGNOSIS — I10 Essential (primary) hypertension: Secondary | ICD-10-CM

## 2017-11-04 DIAGNOSIS — E785 Hyperlipidemia, unspecified: Secondary | ICD-10-CM | POA: Diagnosis not present

## 2017-11-04 DIAGNOSIS — J9601 Acute respiratory failure with hypoxia: Secondary | ICD-10-CM | POA: Insufficient documentation

## 2017-11-04 DIAGNOSIS — Z79899 Other long term (current) drug therapy: Secondary | ICD-10-CM | POA: Insufficient documentation

## 2017-11-04 DIAGNOSIS — J45909 Unspecified asthma, uncomplicated: Secondary | ICD-10-CM | POA: Insufficient documentation

## 2017-11-04 DIAGNOSIS — I120 Hypertensive chronic kidney disease with stage 5 chronic kidney disease or end stage renal disease: Secondary | ICD-10-CM | POA: Diagnosis not present

## 2017-11-04 DIAGNOSIS — Z9119 Patient's noncompliance with other medical treatment and regimen: Secondary | ICD-10-CM | POA: Diagnosis not present

## 2017-11-04 DIAGNOSIS — Z8673 Personal history of transient ischemic attack (TIA), and cerebral infarction without residual deficits: Secondary | ICD-10-CM | POA: Diagnosis not present

## 2017-11-04 MED ORDER — HYDRALAZINE HCL 100 MG PO TABS: 100.0000 mg | ORAL_TABLET | Freq: Two times a day (BID) | ORAL | 3 refills | Status: DC

## 2017-11-04 MED ORDER — METOPROLOL TARTRATE 100 MG PO TABS: 100.0000 mg | ORAL_TABLET | Freq: Two times a day (BID) | ORAL | 3 refills | Status: DC

## 2017-11-04 MED ORDER — FUROSEMIDE 20 MG PO TABS: 20.0000 mg | ORAL_TABLET | Freq: Every day | ORAL | 3 refills | Status: DC

## 2017-11-04 NOTE — Progress Notes (Signed)
Thomas Robinson  TDS:[id number]  WIO:[id number]  DOB - 1961-01-15  Chief Complaint  Patient presents with  . Hospitalization Follow-up       Subjective:   Thomas Robinson is a 57 y.o. male here today for establishment of care. He has a past medical history of hypertension, asthma, stroke, end-stage renal disease with dialysis on Tuesday Thursday and Saturday, hyperlipidemia, anemia and noncompliance. He was hospitalized 10/23/2017 through 10/24/2017. He presented with shortness of breath and cough that has been increasing for months but worse 3 days prior. He started developing some orthopnea. He admitted to missing his last hemodialysis session. He was diagnosed with acute hypoxic respiratory failure and a chest history was consistent with possible pneumonia. He was treated with antibiotics, Lasix, nebs and steroids. He did have any emergent hemodialysis treatment before he asked to leave the hospital.  It looks like he had hemodialysis on yesterday. He was given some by mouth by the physician there which has really helped him a lot. It sounds like it may have been a diuretic but I don't see this listed in Epic. He states the Best with that he ever did. He was able to lay flat. His appetite is really good. No chest pain. No swelling. Due again for hemodialysis on tomorrow. No complaints.   ROS: GEN: denies fever or chills, denies change in weight Skin: denies lesions or rashes HEENT: denies headache, earache, epistaxis, sore throat, or neck pain LUNGS: denies SHOB, dyspnea, PND, orthopnea CV: denies CP or palpitations ABD: denies abd pain, N or V EXT: denies muscle spasms or swelling; no pain in lower ext, no weakness NEURO: denies numbness or tingling, denies sz, stroke or TIA  ALLERGIES: No Known Allergies  PAST MEDICAL HISTORY: Past Medical History:  Diagnosis Date  . Asthma   . Chronic kidney disease   . Hyperlipidemia   . Hypertension   . Stroke ([place])   . Symptomatic anemia  05/24/2017    PAST SURGICAL HISTORY: Past Surgical History:  Procedure Laterality Date  . AV FISTULA PLACEMENT Left 05/28/2017   Procedure: LEFT ARM ARTERIOVENOUS (AV) FISTULA CREATION;  Surgeon: Conrad Lander, MD;  Location: [facility];  Service: Vascular;  Laterality: Left;  . IR FLUORO GUIDE CV LINE RIGHT  05/25/2017  . IR US GUIDE VASC ACCESS RIGHT  05/25/2017  . RINOPLASTY      MEDICATIONS AT HOME: Prior to Admission medications   Medication Sig Start Date End Date Taking? Authorizing Provider  albuterol (PROVENTIL HFA;VENTOLIN HFA) 108 (90 BASE) MCG/ACT inhaler Inhale 1-2 puffs into the lungs every 6 (six) hours as needed for wheezing or shortness of breath. 04/12/15  Yes [name], MD  aspirin 81 MG chewable tablet Chew 1 tablet (81 mg total) by mouth daily. 04/12/15  Yes [name], MD  hydrALAZINE (APRESOLINE) 100 MG tablet Take 1 tablet (100 mg total) by mouth 2 (two) times daily. 11/04/17  Yes Ena Dawley, Nahun Kronberg S, PA-C  metoprolol tartrate (LOPRESSOR) 100 MG tablet Take 1 tablet (100 mg total) by mouth 2 (two) times daily. 11/04/17  Yes Ena Dawley, Paden Senger S, PA-C  sucroferric oxyhydroxide (VELPHORO) 500 MG chewable tablet Chew 1,000 mg by mouth 3 (three) times daily with meals.   Yes [provider]  acetaminophen (TYLENOL) 500 MG tablet Take 1 tablet (500 mg total) by mouth every 4 (four) hours as needed for mild pain or headache. Reported on 12/17/2015 Patient not taking: Reported on 11/04/2017 10/24/17   Colbert Ewing, MD  DiphenhydrAMINE HCl (ZZZQUIL) 50 MG/30ML LIQD Take 30 mLs by mouth at bedtime as needed (for sleep).    [provider]  furosemide (LASIX) 20 MG tablet Take 1 tablet (20 mg total) by mouth daily. 11/04/17   Brayton Caves, PA-C  polyethylene glycol (MIRALAX / GLYCOLAX) packet Take 17 g by mouth daily as needed. Patient not taking: Reported on 11/04/2017 06/02/17   [name], MD    Family History  Problem Relation Age of Onset  .  Cancer Mother      Objective:   Vitals:   11/04/17 1350  BP: (!) 195/89  Pulse: 78  Resp: 16  Temp: 98.1 F (36.7 C)  TempSrc: Oral  SpO2: 96%  Weight: 129 lb 9.6 oz (58.8 kg)  Height: 5' 7.5" (1.715 m)    Exam General appearance : Awake, alert, not in any distress. Speech Clear. Not toxic looking HEENT: Atraumatic and Normocephalic, pupils equally reactive to light and accomodation Neck: supple, no JVD. No cervical lymphadenopathy.  Chest:Good air entry bilaterally, no added sounds  CVS: S1 S2 regular, no murmurs.  Abdomen: Bowel sounds present, Non tender and not distended with no guarding, rigidity or rebound. Extremities: B/L Lower Ext shows no edema, both legs are warm to touch Neurology: Awake alert, and oriented X 3, CN II-XII intact, Non focal Skin:No Rash Wounds:N/A  Data Review Lab Results  Component Value Date   HGBA1C 5.1 04/09/2015     Assessment & Plan  1. Acute Hypoxic Resp Failure-resolved 2. ESRD  -On HD T/TH/Sat  3. HTN-not controlled  -Inc BB, Inc Hydralazine  -Lasix  -recheck BP in 2 weeks with RN  4. Hx Noncompliance 5. Hx CVA  -RF modification, needs better BP control   4 weeks PCP Return in about 2 weeks (around 11/18/2017).  The patient was given clear instructions to go to ER or return to medical center if symptoms don't improve, worsen or new problems develop. The patient verbalized understanding. The patient was told to call to get lab results if they haven't heard anything in the next week.   Total time spent with patient was 31 min. Greater than 50 % of this visit was spent face to face counseling and coordinating care regarding risk factor modification, compliance importance and encouragement, education related to hospitalization.  This note has been created with Surveyor, quantity. Any transcriptional errors are unintentional.    Zettie Pho, PA-C [facility] and  [facility] [place], [place]   11/04/2017, 3:27 PM

## 2017-11-07 DIAGNOSIS — E876 Hypokalemia: Secondary | ICD-10-CM | POA: Diagnosis not present

## 2017-11-07 DIAGNOSIS — Z23 Encounter for immunization: Secondary | ICD-10-CM | POA: Diagnosis not present

## 2017-11-07 DIAGNOSIS — D631 Anemia in chronic kidney disease: Secondary | ICD-10-CM | POA: Diagnosis not present

## 2017-11-07 DIAGNOSIS — N2581 Secondary hyperparathyroidism of renal origin: Secondary | ICD-10-CM | POA: Diagnosis not present

## 2017-11-07 DIAGNOSIS — N186 End stage renal disease: Secondary | ICD-10-CM | POA: Diagnosis not present

## 2017-11-07 DIAGNOSIS — D509 Iron deficiency anemia, unspecified: Secondary | ICD-10-CM | POA: Diagnosis not present

## 2017-11-09 DIAGNOSIS — D631 Anemia in chronic kidney disease: Secondary | ICD-10-CM | POA: Diagnosis not present

## 2017-11-09 DIAGNOSIS — N186 End stage renal disease: Secondary | ICD-10-CM | POA: Diagnosis not present

## 2017-11-09 DIAGNOSIS — Z23 Encounter for immunization: Secondary | ICD-10-CM | POA: Diagnosis not present

## 2017-11-09 DIAGNOSIS — D509 Iron deficiency anemia, unspecified: Secondary | ICD-10-CM | POA: Diagnosis not present

## 2017-11-09 DIAGNOSIS — E876 Hypokalemia: Secondary | ICD-10-CM | POA: Diagnosis not present

## 2017-11-09 DIAGNOSIS — N2581 Secondary hyperparathyroidism of renal origin: Secondary | ICD-10-CM | POA: Diagnosis not present

## 2017-11-10 DIAGNOSIS — N186 End stage renal disease: Secondary | ICD-10-CM | POA: Diagnosis not present

## 2017-11-10 DIAGNOSIS — N2581 Secondary hyperparathyroidism of renal origin: Secondary | ICD-10-CM | POA: Diagnosis not present

## 2017-11-10 DIAGNOSIS — Z23 Encounter for immunization: Secondary | ICD-10-CM | POA: Diagnosis not present

## 2017-11-10 DIAGNOSIS — D509 Iron deficiency anemia, unspecified: Secondary | ICD-10-CM | POA: Diagnosis not present

## 2017-11-10 DIAGNOSIS — D631 Anemia in chronic kidney disease: Secondary | ICD-10-CM | POA: Diagnosis not present

## 2017-11-10 DIAGNOSIS — E876 Hypokalemia: Secondary | ICD-10-CM | POA: Diagnosis not present

## 2017-11-12 DIAGNOSIS — D509 Iron deficiency anemia, unspecified: Secondary | ICD-10-CM | POA: Diagnosis not present

## 2017-11-12 DIAGNOSIS — D631 Anemia in chronic kidney disease: Secondary | ICD-10-CM | POA: Diagnosis not present

## 2017-11-12 DIAGNOSIS — N2581 Secondary hyperparathyroidism of renal origin: Secondary | ICD-10-CM | POA: Diagnosis not present

## 2017-11-12 DIAGNOSIS — Z23 Encounter for immunization: Secondary | ICD-10-CM | POA: Diagnosis not present

## 2017-11-12 DIAGNOSIS — N186 End stage renal disease: Secondary | ICD-10-CM | POA: Diagnosis not present

## 2017-11-12 DIAGNOSIS — E876 Hypokalemia: Secondary | ICD-10-CM | POA: Diagnosis not present

## 2017-11-17 DIAGNOSIS — D509 Iron deficiency anemia, unspecified: Secondary | ICD-10-CM | POA: Diagnosis not present

## 2017-11-17 DIAGNOSIS — E876 Hypokalemia: Secondary | ICD-10-CM | POA: Diagnosis not present

## 2017-11-17 DIAGNOSIS — Z23 Encounter for immunization: Secondary | ICD-10-CM | POA: Diagnosis not present

## 2017-11-17 DIAGNOSIS — N186 End stage renal disease: Secondary | ICD-10-CM | POA: Diagnosis not present

## 2017-11-17 DIAGNOSIS — D631 Anemia in chronic kidney disease: Secondary | ICD-10-CM | POA: Diagnosis not present

## 2017-11-17 DIAGNOSIS — N2581 Secondary hyperparathyroidism of renal origin: Secondary | ICD-10-CM | POA: Diagnosis not present

## 2017-11-19 DIAGNOSIS — E876 Hypokalemia: Secondary | ICD-10-CM | POA: Diagnosis not present

## 2017-11-19 DIAGNOSIS — N186 End stage renal disease: Secondary | ICD-10-CM | POA: Diagnosis not present

## 2017-11-19 DIAGNOSIS — D509 Iron deficiency anemia, unspecified: Secondary | ICD-10-CM | POA: Diagnosis not present

## 2017-11-19 DIAGNOSIS — Z23 Encounter for immunization: Secondary | ICD-10-CM | POA: Diagnosis not present

## 2017-11-19 DIAGNOSIS — D631 Anemia in chronic kidney disease: Secondary | ICD-10-CM | POA: Diagnosis not present

## 2017-11-19 DIAGNOSIS — N2581 Secondary hyperparathyroidism of renal origin: Secondary | ICD-10-CM | POA: Diagnosis not present

## 2017-11-24 DIAGNOSIS — Z23 Encounter for immunization: Secondary | ICD-10-CM | POA: Diagnosis not present

## 2017-11-24 DIAGNOSIS — N186 End stage renal disease: Secondary | ICD-10-CM | POA: Diagnosis not present

## 2017-11-24 DIAGNOSIS — N2581 Secondary hyperparathyroidism of renal origin: Secondary | ICD-10-CM | POA: Diagnosis not present

## 2017-11-24 DIAGNOSIS — E876 Hypokalemia: Secondary | ICD-10-CM | POA: Diagnosis not present

## 2017-11-24 DIAGNOSIS — D509 Iron deficiency anemia, unspecified: Secondary | ICD-10-CM | POA: Diagnosis not present

## 2017-11-24 DIAGNOSIS — D631 Anemia in chronic kidney disease: Secondary | ICD-10-CM | POA: Diagnosis not present

## 2017-11-27 DIAGNOSIS — N186 End stage renal disease: Secondary | ICD-10-CM | POA: Diagnosis not present

## 2017-11-27 DIAGNOSIS — N2581 Secondary hyperparathyroidism of renal origin: Secondary | ICD-10-CM | POA: Diagnosis not present

## 2017-11-27 DIAGNOSIS — D631 Anemia in chronic kidney disease: Secondary | ICD-10-CM | POA: Diagnosis not present

## 2017-11-27 DIAGNOSIS — E876 Hypokalemia: Secondary | ICD-10-CM | POA: Diagnosis not present

## 2017-11-27 DIAGNOSIS — D509 Iron deficiency anemia, unspecified: Secondary | ICD-10-CM | POA: Diagnosis not present

## 2017-11-27 DIAGNOSIS — Z23 Encounter for immunization: Secondary | ICD-10-CM | POA: Diagnosis not present

## 2017-11-30 DIAGNOSIS — I120 Hypertensive chronic kidney disease with stage 5 chronic kidney disease or end stage renal disease: Secondary | ICD-10-CM | POA: Diagnosis not present

## 2017-11-30 DIAGNOSIS — Z992 Dependence on renal dialysis: Secondary | ICD-10-CM | POA: Diagnosis not present

## 2017-11-30 DIAGNOSIS — N186 End stage renal disease: Secondary | ICD-10-CM | POA: Diagnosis not present

## 2017-12-01 DIAGNOSIS — N186 End stage renal disease: Secondary | ICD-10-CM | POA: Diagnosis not present

## 2017-12-01 DIAGNOSIS — E876 Hypokalemia: Secondary | ICD-10-CM | POA: Diagnosis not present

## 2017-12-01 DIAGNOSIS — N2581 Secondary hyperparathyroidism of renal origin: Secondary | ICD-10-CM | POA: Diagnosis not present

## 2017-12-01 DIAGNOSIS — Z23 Encounter for immunization: Secondary | ICD-10-CM | POA: Diagnosis not present

## 2017-12-01 DIAGNOSIS — D631 Anemia in chronic kidney disease: Secondary | ICD-10-CM | POA: Diagnosis not present

## 2017-12-02 ENCOUNTER — Ambulatory Visit: Payer: Medicare Other | Admitting: Nurse Practitioner

## 2017-12-03 DIAGNOSIS — E876 Hypokalemia: Secondary | ICD-10-CM | POA: Diagnosis not present

## 2017-12-03 DIAGNOSIS — N186 End stage renal disease: Secondary | ICD-10-CM | POA: Diagnosis not present

## 2017-12-03 DIAGNOSIS — N2581 Secondary hyperparathyroidism of renal origin: Secondary | ICD-10-CM | POA: Diagnosis not present

## 2017-12-03 DIAGNOSIS — Z23 Encounter for immunization: Secondary | ICD-10-CM | POA: Diagnosis not present

## 2017-12-03 DIAGNOSIS — D631 Anemia in chronic kidney disease: Secondary | ICD-10-CM | POA: Diagnosis not present

## 2017-12-08 DIAGNOSIS — N2581 Secondary hyperparathyroidism of renal origin: Secondary | ICD-10-CM | POA: Diagnosis not present

## 2017-12-08 DIAGNOSIS — Z23 Encounter for immunization: Secondary | ICD-10-CM | POA: Diagnosis not present

## 2017-12-08 DIAGNOSIS — N186 End stage renal disease: Secondary | ICD-10-CM | POA: Diagnosis not present

## 2017-12-08 DIAGNOSIS — E876 Hypokalemia: Secondary | ICD-10-CM | POA: Diagnosis not present

## 2017-12-08 DIAGNOSIS — D631 Anemia in chronic kidney disease: Secondary | ICD-10-CM | POA: Diagnosis not present

## 2017-12-10 DIAGNOSIS — E876 Hypokalemia: Secondary | ICD-10-CM | POA: Diagnosis not present

## 2017-12-10 DIAGNOSIS — N186 End stage renal disease: Secondary | ICD-10-CM | POA: Diagnosis not present

## 2017-12-10 DIAGNOSIS — N2581 Secondary hyperparathyroidism of renal origin: Secondary | ICD-10-CM | POA: Diagnosis not present

## 2017-12-10 DIAGNOSIS — D631 Anemia in chronic kidney disease: Secondary | ICD-10-CM | POA: Diagnosis not present

## 2017-12-10 DIAGNOSIS — Z23 Encounter for immunization: Secondary | ICD-10-CM | POA: Diagnosis not present

## 2017-12-12 DIAGNOSIS — D631 Anemia in chronic kidney disease: Secondary | ICD-10-CM | POA: Diagnosis not present

## 2017-12-12 DIAGNOSIS — N186 End stage renal disease: Secondary | ICD-10-CM | POA: Diagnosis not present

## 2017-12-12 DIAGNOSIS — E876 Hypokalemia: Secondary | ICD-10-CM | POA: Diagnosis not present

## 2017-12-12 DIAGNOSIS — Z23 Encounter for immunization: Secondary | ICD-10-CM | POA: Diagnosis not present

## 2017-12-12 DIAGNOSIS — N2581 Secondary hyperparathyroidism of renal origin: Secondary | ICD-10-CM | POA: Diagnosis not present

## 2017-12-15 DIAGNOSIS — N2581 Secondary hyperparathyroidism of renal origin: Secondary | ICD-10-CM | POA: Diagnosis not present

## 2017-12-15 DIAGNOSIS — D631 Anemia in chronic kidney disease: Secondary | ICD-10-CM | POA: Diagnosis not present

## 2017-12-15 DIAGNOSIS — Z23 Encounter for immunization: Secondary | ICD-10-CM | POA: Diagnosis not present

## 2017-12-15 DIAGNOSIS — N186 End stage renal disease: Secondary | ICD-10-CM | POA: Diagnosis not present

## 2017-12-15 DIAGNOSIS — E876 Hypokalemia: Secondary | ICD-10-CM | POA: Diagnosis not present

## 2017-12-17 DIAGNOSIS — N186 End stage renal disease: Secondary | ICD-10-CM | POA: Diagnosis not present

## 2017-12-17 DIAGNOSIS — N2581 Secondary hyperparathyroidism of renal origin: Secondary | ICD-10-CM | POA: Diagnosis not present

## 2017-12-17 DIAGNOSIS — Z23 Encounter for immunization: Secondary | ICD-10-CM | POA: Diagnosis not present

## 2017-12-17 DIAGNOSIS — E876 Hypokalemia: Secondary | ICD-10-CM | POA: Diagnosis not present

## 2017-12-17 DIAGNOSIS — D631 Anemia in chronic kidney disease: Secondary | ICD-10-CM | POA: Diagnosis not present

## 2017-12-19 DIAGNOSIS — N186 End stage renal disease: Secondary | ICD-10-CM | POA: Diagnosis not present

## 2017-12-19 DIAGNOSIS — N2581 Secondary hyperparathyroidism of renal origin: Secondary | ICD-10-CM | POA: Diagnosis not present

## 2017-12-19 DIAGNOSIS — Z23 Encounter for immunization: Secondary | ICD-10-CM | POA: Diagnosis not present

## 2017-12-19 DIAGNOSIS — E876 Hypokalemia: Secondary | ICD-10-CM | POA: Diagnosis not present

## 2017-12-19 DIAGNOSIS — D631 Anemia in chronic kidney disease: Secondary | ICD-10-CM | POA: Diagnosis not present

## 2017-12-22 DIAGNOSIS — N2581 Secondary hyperparathyroidism of renal origin: Secondary | ICD-10-CM | POA: Diagnosis not present

## 2017-12-22 DIAGNOSIS — Z23 Encounter for immunization: Secondary | ICD-10-CM | POA: Diagnosis not present

## 2017-12-22 DIAGNOSIS — D631 Anemia in chronic kidney disease: Secondary | ICD-10-CM | POA: Diagnosis not present

## 2017-12-22 DIAGNOSIS — E876 Hypokalemia: Secondary | ICD-10-CM | POA: Diagnosis not present

## 2017-12-22 DIAGNOSIS — N186 End stage renal disease: Secondary | ICD-10-CM | POA: Diagnosis not present

## 2017-12-24 DIAGNOSIS — N186 End stage renal disease: Secondary | ICD-10-CM | POA: Diagnosis not present

## 2017-12-24 DIAGNOSIS — E876 Hypokalemia: Secondary | ICD-10-CM | POA: Diagnosis not present

## 2017-12-24 DIAGNOSIS — N2581 Secondary hyperparathyroidism of renal origin: Secondary | ICD-10-CM | POA: Diagnosis not present

## 2017-12-24 DIAGNOSIS — Z23 Encounter for immunization: Secondary | ICD-10-CM | POA: Diagnosis not present

## 2017-12-24 DIAGNOSIS — D631 Anemia in chronic kidney disease: Secondary | ICD-10-CM | POA: Diagnosis not present

## 2017-12-29 DIAGNOSIS — Z23 Encounter for immunization: Secondary | ICD-10-CM | POA: Diagnosis not present

## 2017-12-29 DIAGNOSIS — E876 Hypokalemia: Secondary | ICD-10-CM | POA: Diagnosis not present

## 2017-12-29 DIAGNOSIS — N2581 Secondary hyperparathyroidism of renal origin: Secondary | ICD-10-CM | POA: Diagnosis not present

## 2017-12-29 DIAGNOSIS — D631 Anemia in chronic kidney disease: Secondary | ICD-10-CM | POA: Diagnosis not present

## 2017-12-29 DIAGNOSIS — N186 End stage renal disease: Secondary | ICD-10-CM | POA: Diagnosis not present

## 2017-12-30 DIAGNOSIS — Z992 Dependence on renal dialysis: Secondary | ICD-10-CM | POA: Diagnosis not present

## 2017-12-30 DIAGNOSIS — N186 End stage renal disease: Secondary | ICD-10-CM | POA: Diagnosis not present

## 2017-12-30 DIAGNOSIS — I120 Hypertensive chronic kidney disease with stage 5 chronic kidney disease or end stage renal disease: Secondary | ICD-10-CM | POA: Diagnosis not present

## 2017-12-31 DIAGNOSIS — N2581 Secondary hyperparathyroidism of renal origin: Secondary | ICD-10-CM | POA: Diagnosis not present

## 2017-12-31 DIAGNOSIS — N186 End stage renal disease: Secondary | ICD-10-CM | POA: Diagnosis not present

## 2017-12-31 DIAGNOSIS — E876 Hypokalemia: Secondary | ICD-10-CM | POA: Diagnosis not present

## 2017-12-31 DIAGNOSIS — D631 Anemia in chronic kidney disease: Secondary | ICD-10-CM | POA: Diagnosis not present

## 2018-01-02 DIAGNOSIS — N186 End stage renal disease: Secondary | ICD-10-CM | POA: Diagnosis not present

## 2018-01-02 DIAGNOSIS — D631 Anemia in chronic kidney disease: Secondary | ICD-10-CM | POA: Diagnosis not present

## 2018-01-02 DIAGNOSIS — E876 Hypokalemia: Secondary | ICD-10-CM | POA: Diagnosis not present

## 2018-01-02 DIAGNOSIS — N2581 Secondary hyperparathyroidism of renal origin: Secondary | ICD-10-CM | POA: Diagnosis not present

## 2018-01-05 DIAGNOSIS — N2581 Secondary hyperparathyroidism of renal origin: Secondary | ICD-10-CM | POA: Diagnosis not present

## 2018-01-05 DIAGNOSIS — N186 End stage renal disease: Secondary | ICD-10-CM | POA: Diagnosis not present

## 2018-01-05 DIAGNOSIS — D631 Anemia in chronic kidney disease: Secondary | ICD-10-CM | POA: Diagnosis not present

## 2018-01-05 DIAGNOSIS — E876 Hypokalemia: Secondary | ICD-10-CM | POA: Diagnosis not present

## 2018-01-09 DIAGNOSIS — D631 Anemia in chronic kidney disease: Secondary | ICD-10-CM | POA: Diagnosis not present

## 2018-01-09 DIAGNOSIS — N2581 Secondary hyperparathyroidism of renal origin: Secondary | ICD-10-CM | POA: Diagnosis not present

## 2018-01-09 DIAGNOSIS — N186 End stage renal disease: Secondary | ICD-10-CM | POA: Diagnosis not present

## 2018-01-09 DIAGNOSIS — E876 Hypokalemia: Secondary | ICD-10-CM | POA: Diagnosis not present

## 2018-01-11 DIAGNOSIS — I871 Compression of vein: Secondary | ICD-10-CM | POA: Diagnosis not present

## 2018-01-11 DIAGNOSIS — T82858A Stenosis of vascular prosthetic devices, implants and grafts, initial encounter: Secondary | ICD-10-CM | POA: Diagnosis not present

## 2018-01-11 DIAGNOSIS — N186 End stage renal disease: Secondary | ICD-10-CM | POA: Diagnosis not present

## 2018-01-11 DIAGNOSIS — Z992 Dependence on renal dialysis: Secondary | ICD-10-CM | POA: Diagnosis not present

## 2018-01-12 DIAGNOSIS — N186 End stage renal disease: Secondary | ICD-10-CM | POA: Diagnosis not present

## 2018-01-12 DIAGNOSIS — E876 Hypokalemia: Secondary | ICD-10-CM | POA: Diagnosis not present

## 2018-01-12 DIAGNOSIS — D631 Anemia in chronic kidney disease: Secondary | ICD-10-CM | POA: Diagnosis not present

## 2018-01-12 DIAGNOSIS — N2581 Secondary hyperparathyroidism of renal origin: Secondary | ICD-10-CM | POA: Diagnosis not present

## 2018-01-14 DIAGNOSIS — N186 End stage renal disease: Secondary | ICD-10-CM | POA: Diagnosis not present

## 2018-01-14 DIAGNOSIS — N2581 Secondary hyperparathyroidism of renal origin: Secondary | ICD-10-CM | POA: Diagnosis not present

## 2018-01-14 DIAGNOSIS — D631 Anemia in chronic kidney disease: Secondary | ICD-10-CM | POA: Diagnosis not present

## 2018-01-14 DIAGNOSIS — E876 Hypokalemia: Secondary | ICD-10-CM | POA: Diagnosis not present

## 2018-01-19 DIAGNOSIS — N2581 Secondary hyperparathyroidism of renal origin: Secondary | ICD-10-CM | POA: Diagnosis not present

## 2018-01-19 DIAGNOSIS — D631 Anemia in chronic kidney disease: Secondary | ICD-10-CM | POA: Diagnosis not present

## 2018-01-19 DIAGNOSIS — E876 Hypokalemia: Secondary | ICD-10-CM | POA: Diagnosis not present

## 2018-01-19 DIAGNOSIS — N186 End stage renal disease: Secondary | ICD-10-CM | POA: Diagnosis not present

## 2018-01-21 DIAGNOSIS — N2581 Secondary hyperparathyroidism of renal origin: Secondary | ICD-10-CM | POA: Diagnosis not present

## 2018-01-21 DIAGNOSIS — N186 End stage renal disease: Secondary | ICD-10-CM | POA: Diagnosis not present

## 2018-01-21 DIAGNOSIS — E876 Hypokalemia: Secondary | ICD-10-CM | POA: Diagnosis not present

## 2018-01-21 DIAGNOSIS — D631 Anemia in chronic kidney disease: Secondary | ICD-10-CM | POA: Diagnosis not present

## 2018-01-23 DIAGNOSIS — D631 Anemia in chronic kidney disease: Secondary | ICD-10-CM | POA: Diagnosis not present

## 2018-01-23 DIAGNOSIS — N2581 Secondary hyperparathyroidism of renal origin: Secondary | ICD-10-CM | POA: Diagnosis not present

## 2018-01-23 DIAGNOSIS — E876 Hypokalemia: Secondary | ICD-10-CM | POA: Diagnosis not present

## 2018-01-23 DIAGNOSIS — N186 End stage renal disease: Secondary | ICD-10-CM | POA: Diagnosis not present

## 2018-01-26 DIAGNOSIS — D631 Anemia in chronic kidney disease: Secondary | ICD-10-CM | POA: Diagnosis not present

## 2018-01-26 DIAGNOSIS — N186 End stage renal disease: Secondary | ICD-10-CM | POA: Diagnosis not present

## 2018-01-26 DIAGNOSIS — E876 Hypokalemia: Secondary | ICD-10-CM | POA: Diagnosis not present

## 2018-01-26 DIAGNOSIS — N2581 Secondary hyperparathyroidism of renal origin: Secondary | ICD-10-CM | POA: Diagnosis not present

## 2018-01-28 IMAGING — XA IR US GUIDE VASC ACCESS RIGHT
1 series · 1 of 1 positions shown · non-contrast
Comparison: none

INDICATION: 56-year-old male with renal failure

[Series 1: fl angio · 1 of 1 slices shown]
[im 1/1]
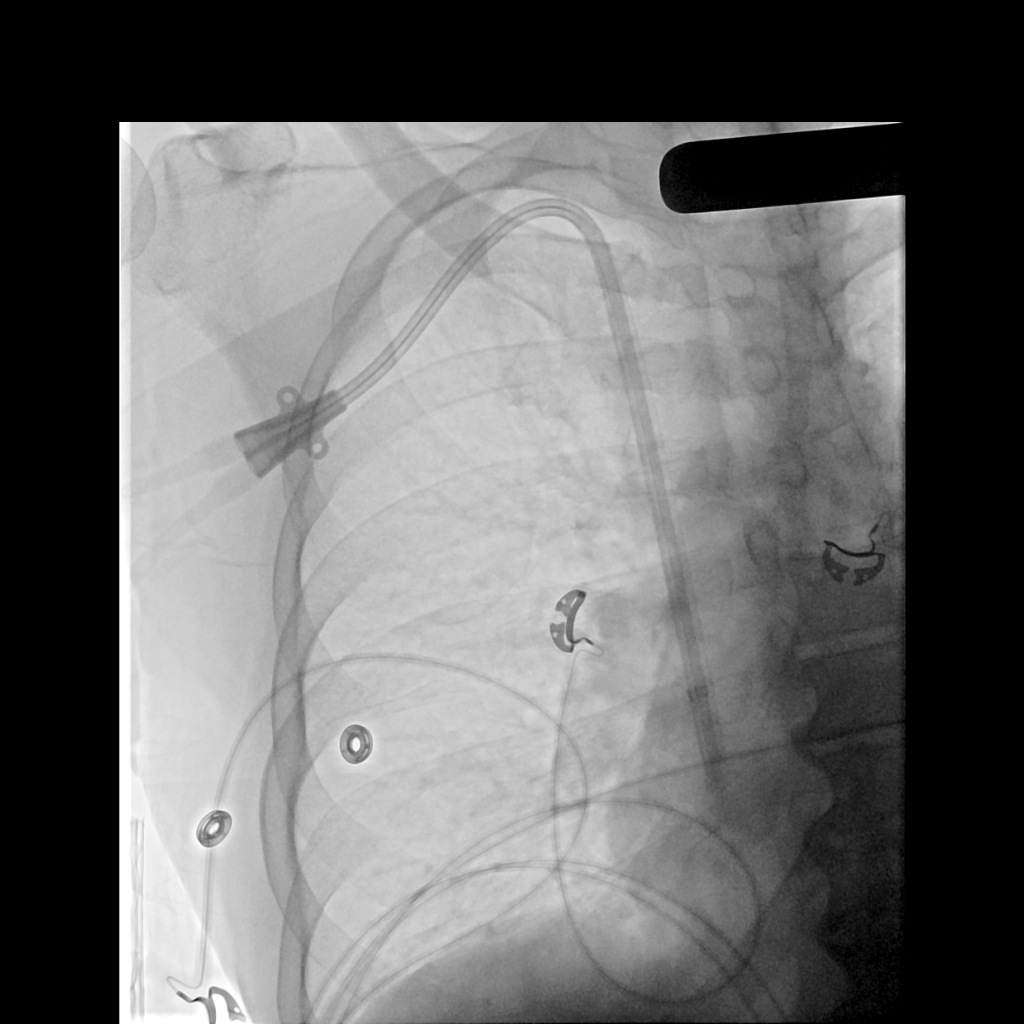

[1 of 1 positions shown; findings below may reference images not displayed]

EXAM:
IMAGE GUIDED CENTRAL VENOUS CATHETER PLACEMENT

MEDICATIONS:
2.0 g Ancef; The antibiotic was administered within an appropriate
time interval prior to skin puncture.

ANESTHESIA/SEDATION:
Moderate (conscious) sedation was employed during this procedure. A
total of Versed 1.0 mg and Fentanyl 50 mcg was administered
intravenously.

Moderate Sedation Time: 15 minutes. The patient's level of
consciousness and vital signs were monitored continuously by
radiology nursing throughout the procedure under my direct
supervision.

FLUOROSCOPY TIME:  Fluoroscopy Time: 0 minutes 12 seconds (1.9 mGy).

COMPLICATIONS:
None



After creating a small venotomy incision, a micropuncture kit was
utilized to access the right internal jugular vein under direct,
real-time ultrasound guidance after the overlying soft tissues were
anesthetized with 1% lidocaine with epinephrine. Ultrasound image
documentation was performed. The microwire was marked to measure
appropriate internal catheter length. External tunneled length was
estimated. A total tip to cuff length of 19 cm was selected.

Skin and subcutaneous tissues of chest wall below the clavicle were
generously infiltrated with 1% lidocaine for local anesthesia. A
small stab incision was made with 11 blade scalpel. The selected
hemodialysis catheter was tunneled in a retrograde fashion from the
anterior chest wall to the venotomy incision.

A guidewire was advanced to the level of the IVC and the
micropuncture sheath was exchanged for a peel-away sheath.

The catheter was then placed through the peel-away sheath with tips
ultimately positioned within the superior aspect of the right
atrium. Final catheter positioning was confirmed and documented with
a spot radiographic image. The catheter aspirates and flushes
normally. The catheter was flushed with appropriate volume heparin
dwells.

The catheter exit site was secured with a 0-Prolene retention
suture. The venotomy incision was closed Derma bond and sterile
dressing. Dressings were applied at the chest wall.

Patient tolerated the procedure well and remained hemodynamically
stable throughout.

No complications were encountered and no significant blood loss
encountered.
IMPRESSION: Status post right IJ hemodialysis catheter placement. Catheter ready
for use.

## 2018-01-30 DIAGNOSIS — N186 End stage renal disease: Secondary | ICD-10-CM | POA: Diagnosis not present

## 2018-01-30 DIAGNOSIS — D631 Anemia in chronic kidney disease: Secondary | ICD-10-CM | POA: Diagnosis not present

## 2018-01-30 DIAGNOSIS — N2581 Secondary hyperparathyroidism of renal origin: Secondary | ICD-10-CM | POA: Diagnosis not present

## 2018-01-30 DIAGNOSIS — I10 Essential (primary) hypertension: Secondary | ICD-10-CM | POA: Diagnosis not present

## 2018-01-30 DIAGNOSIS — Z992 Dependence on renal dialysis: Secondary | ICD-10-CM | POA: Diagnosis not present

## 2018-01-30 DIAGNOSIS — E876 Hypokalemia: Secondary | ICD-10-CM | POA: Diagnosis not present

## 2018-02-02 DIAGNOSIS — E876 Hypokalemia: Secondary | ICD-10-CM | POA: Diagnosis not present

## 2018-02-02 DIAGNOSIS — D631 Anemia in chronic kidney disease: Secondary | ICD-10-CM | POA: Diagnosis not present

## 2018-02-02 DIAGNOSIS — N2581 Secondary hyperparathyroidism of renal origin: Secondary | ICD-10-CM | POA: Diagnosis not present

## 2018-02-02 DIAGNOSIS — N186 End stage renal disease: Secondary | ICD-10-CM | POA: Diagnosis not present

## 2018-02-04 DIAGNOSIS — I10 Essential (primary) hypertension: Secondary | ICD-10-CM | POA: Diagnosis not present

## 2018-02-04 DIAGNOSIS — I120 Hypertensive chronic kidney disease with stage 5 chronic kidney disease or end stage renal disease: Secondary | ICD-10-CM | POA: Diagnosis not present

## 2018-02-04 DIAGNOSIS — Z87891 Personal history of nicotine dependence: Secondary | ICD-10-CM | POA: Diagnosis not present

## 2018-02-04 DIAGNOSIS — I368 Other nonrheumatic tricuspid valve disorders: Secondary | ICD-10-CM | POA: Diagnosis not present

## 2018-02-04 DIAGNOSIS — E039 Hypothyroidism, unspecified: Secondary | ICD-10-CM | POA: Diagnosis not present

## 2018-02-04 DIAGNOSIS — D631 Anemia in chronic kidney disease: Secondary | ICD-10-CM | POA: Diagnosis not present

## 2018-02-04 DIAGNOSIS — I340 Nonrheumatic mitral (valve) insufficiency: Secondary | ICD-10-CM | POA: Diagnosis not present

## 2018-02-04 DIAGNOSIS — I351 Nonrheumatic aortic (valve) insufficiency: Secondary | ICD-10-CM | POA: Diagnosis not present

## 2018-02-04 DIAGNOSIS — I378 Other nonrheumatic pulmonary valve disorders: Secondary | ICD-10-CM | POA: Diagnosis not present

## 2018-02-04 DIAGNOSIS — N186 End stage renal disease: Secondary | ICD-10-CM | POA: Diagnosis not present

## 2018-02-04 DIAGNOSIS — Z7982 Long term (current) use of aspirin: Secondary | ICD-10-CM | POA: Diagnosis not present

## 2018-02-04 DIAGNOSIS — Z992 Dependence on renal dialysis: Secondary | ICD-10-CM | POA: Diagnosis not present

## 2018-02-04 DIAGNOSIS — R079 Chest pain, unspecified: Secondary | ICD-10-CM | POA: Diagnosis not present

## 2018-02-06 ENCOUNTER — Emergency Department (HOSPITAL_COMMUNITY)
Admission: EM | Admit: 2018-02-06 | Discharge: 2018-02-07 | Disposition: A | Discharge status: Home / Self Care | Payer: Medicare Other | Attending: Emergency Medicine | Admitting: Emergency Medicine

## 2018-02-06 DIAGNOSIS — Z5321 Procedure and treatment not carried out due to patient leaving prior to being seen by health care provider: Secondary | ICD-10-CM | POA: Diagnosis not present

## 2018-02-06 DIAGNOSIS — N186 End stage renal disease: Secondary | ICD-10-CM | POA: Diagnosis not present

## 2018-02-06 DIAGNOSIS — R079 Chest pain, unspecified: Secondary | ICD-10-CM | POA: Diagnosis not present

## 2018-02-06 DIAGNOSIS — D631 Anemia in chronic kidney disease: Secondary | ICD-10-CM | POA: Diagnosis not present

## 2018-02-06 DIAGNOSIS — E876 Hypokalemia: Secondary | ICD-10-CM | POA: Diagnosis not present

## 2018-02-06 DIAGNOSIS — N2581 Secondary hyperparathyroidism of renal origin: Secondary | ICD-10-CM | POA: Diagnosis not present

## 2018-02-07 ENCOUNTER — Encounter (HOSPITAL_COMMUNITY): Payer: Self-pay | Admitting: *Deleted

## 2018-02-07 ENCOUNTER — Emergency Department (HOSPITAL_COMMUNITY): Payer: Medicare Other

## 2018-02-07 ENCOUNTER — Other Ambulatory Visit: Payer: Self-pay

## 2018-02-07 DIAGNOSIS — R079 Chest pain, unspecified: Secondary | ICD-10-CM | POA: Diagnosis not present

## 2018-02-07 LAB — CBC
HEMATOCRIT: 35.1 % — AB (ref 39.0–52.0)
HEMOGLOBIN: 11.5 g/dL — AB (ref 13.0–17.0)
MCH: 32.2 pg (ref 26.0–34.0)
MCHC: 32.8 g/dL (ref 30.0–36.0)
MCV: 98.3 fL (ref 78.0–100.0)
Platelets: 203 10*3/uL (ref 150–400)
RBC: 3.57 MIL/uL — ABNORMAL LOW (ref 4.22–5.81)
RDW: 17 % — ABNORMAL HIGH (ref 11.5–15.5)
WBC: 5 10*3/uL (ref 4.0–10.5)

## 2018-02-07 LAB — BASIC METABOLIC PANEL
ANION GAP: 18 — AB (ref 5–15)
BUN: 54 mg/dL — ABNORMAL HIGH (ref 6–20)
CHLORIDE: 98 mmol/L — AB (ref 101–111)
CO2: 24 mmol/L (ref 22–32)
Calcium: 9.5 mg/dL (ref 8.9–10.3)
Creatinine, Ser: 6.92 mg/dL — ABNORMAL HIGH (ref 0.61–1.24)
GFR calc Af Amer: 9 mL/min — ABNORMAL LOW (ref >60)
GFR, EST NON AFRICAN AMERICAN: 8 mL/min — AB (ref >60)
Glucose, Bld: 112 mg/dL — ABNORMAL HIGH (ref 65–99)
POTASSIUM: 3.7 mmol/L (ref 3.5–5.1)
Sodium: 140 mmol/L (ref 135–145)

## 2018-02-07 LAB — TROPONIN I: Troponin I: 0.08 ng/mL (ref <0.03)

## 2018-02-07 NOTE — ED Notes (Signed)
No response when called for vitals.  

## 2018-02-07 NOTE — ED Notes (Signed)
Attempted to call patient to return to the ED.  Unable to reach him at this time.  Message left to call me back.

## 2018-02-07 NOTE — ED Triage Notes (Signed)
The pt is c/o lt sided chest pain yesterday after he finished dialysis  His heart was beating  hjard some sob and dizziness sl nausea  Fistula lt arm

## 2018-02-09 DIAGNOSIS — E876 Hypokalemia: Secondary | ICD-10-CM | POA: Diagnosis not present

## 2018-02-09 DIAGNOSIS — N2581 Secondary hyperparathyroidism of renal origin: Secondary | ICD-10-CM | POA: Diagnosis not present

## 2018-02-09 DIAGNOSIS — N186 End stage renal disease: Secondary | ICD-10-CM | POA: Diagnosis not present

## 2018-02-09 DIAGNOSIS — D631 Anemia in chronic kidney disease: Secondary | ICD-10-CM | POA: Diagnosis not present

## 2018-02-13 DIAGNOSIS — E876 Hypokalemia: Secondary | ICD-10-CM | POA: Diagnosis not present

## 2018-02-13 DIAGNOSIS — N2581 Secondary hyperparathyroidism of renal origin: Secondary | ICD-10-CM | POA: Diagnosis not present

## 2018-02-13 DIAGNOSIS — N186 End stage renal disease: Secondary | ICD-10-CM | POA: Diagnosis not present

## 2018-02-13 DIAGNOSIS — D631 Anemia in chronic kidney disease: Secondary | ICD-10-CM | POA: Diagnosis not present

## 2018-02-15 DIAGNOSIS — I160 Hypertensive urgency: Secondary | ICD-10-CM | POA: Diagnosis not present

## 2018-02-15 DIAGNOSIS — R0789 Other chest pain: Secondary | ICD-10-CM | POA: Diagnosis not present

## 2018-02-15 DIAGNOSIS — B029 Zoster without complications: Secondary | ICD-10-CM | POA: Diagnosis not present

## 2018-02-16 ENCOUNTER — Emergency Department (HOSPITAL_COMMUNITY): Payer: Medicare Other

## 2018-02-16 ENCOUNTER — Observation Stay (HOSPITAL_COMMUNITY)
Admission: AD | Admit: 2018-02-16 | Discharge: 2018-02-17 | Discharge status: Against Medical Advice | Payer: Medicare Other | Attending: Internal Medicine | Admitting: Internal Medicine

## 2018-02-16 ENCOUNTER — Other Ambulatory Visit: Payer: Self-pay

## 2018-02-16 ENCOUNTER — Encounter (HOSPITAL_COMMUNITY): Payer: Self-pay | Admitting: *Deleted

## 2018-02-16 DIAGNOSIS — R0789 Other chest pain: Secondary | ICD-10-CM | POA: Diagnosis not present

## 2018-02-16 DIAGNOSIS — J45901 Unspecified asthma with (acute) exacerbation: Secondary | ICD-10-CM | POA: Diagnosis not present

## 2018-02-16 DIAGNOSIS — I120 Hypertensive chronic kidney disease with stage 5 chronic kidney disease or end stage renal disease: Secondary | ICD-10-CM | POA: Insufficient documentation

## 2018-02-16 DIAGNOSIS — Z7951 Long term (current) use of inhaled steroids: Secondary | ICD-10-CM | POA: Diagnosis not present

## 2018-02-16 DIAGNOSIS — Z992 Dependence on renal dialysis: Secondary | ICD-10-CM | POA: Insufficient documentation

## 2018-02-16 DIAGNOSIS — G8929 Other chronic pain: Secondary | ICD-10-CM | POA: Diagnosis not present

## 2018-02-16 DIAGNOSIS — I10 Essential (primary) hypertension: Secondary | ICD-10-CM | POA: Diagnosis not present

## 2018-02-16 DIAGNOSIS — E8770 Fluid overload, unspecified: Secondary | ICD-10-CM | POA: Diagnosis not present

## 2018-02-16 DIAGNOSIS — Z9115 Patient's noncompliance with renal dialysis: Secondary | ICD-10-CM | POA: Diagnosis not present

## 2018-02-16 DIAGNOSIS — E876 Hypokalemia: Secondary | ICD-10-CM | POA: Diagnosis not present

## 2018-02-16 DIAGNOSIS — I160 Hypertensive urgency: Secondary | ICD-10-CM | POA: Diagnosis present

## 2018-02-16 DIAGNOSIS — Z7982 Long term (current) use of aspirin: Secondary | ICD-10-CM | POA: Insufficient documentation

## 2018-02-16 DIAGNOSIS — Z79899 Other long term (current) drug therapy: Secondary | ICD-10-CM | POA: Diagnosis not present

## 2018-02-16 DIAGNOSIS — N179 Acute kidney failure, unspecified: Secondary | ICD-10-CM | POA: Insufficient documentation

## 2018-02-16 DIAGNOSIS — E785 Hyperlipidemia, unspecified: Secondary | ICD-10-CM | POA: Diagnosis not present

## 2018-02-16 DIAGNOSIS — I4581 Long QT syndrome: Secondary | ICD-10-CM | POA: Insufficient documentation

## 2018-02-16 DIAGNOSIS — R0602 Shortness of breath: Secondary | ICD-10-CM | POA: Diagnosis not present

## 2018-02-16 DIAGNOSIS — I491 Atrial premature depolarization: Secondary | ICD-10-CM | POA: Diagnosis not present

## 2018-02-16 DIAGNOSIS — R531 Weakness: Secondary | ICD-10-CM | POA: Diagnosis not present

## 2018-02-16 DIAGNOSIS — R42 Dizziness and giddiness: Secondary | ICD-10-CM | POA: Diagnosis not present

## 2018-02-16 DIAGNOSIS — I351 Nonrheumatic aortic (valve) insufficiency: Secondary | ICD-10-CM | POA: Insufficient documentation

## 2018-02-16 DIAGNOSIS — N186 End stage renal disease: Secondary | ICD-10-CM

## 2018-02-16 DIAGNOSIS — J90 Pleural effusion, not elsewhere classified: Secondary | ICD-10-CM | POA: Insufficient documentation

## 2018-02-16 DIAGNOSIS — B029 Zoster without complications: Secondary | ICD-10-CM | POA: Insufficient documentation

## 2018-02-16 DIAGNOSIS — R079 Chest pain, unspecified: Secondary | ICD-10-CM | POA: Diagnosis not present

## 2018-02-16 DIAGNOSIS — Z809 Family history of malignant neoplasm, unspecified: Secondary | ICD-10-CM | POA: Insufficient documentation

## 2018-02-16 DIAGNOSIS — I471 Supraventricular tachycardia: Secondary | ICD-10-CM | POA: Diagnosis not present

## 2018-02-16 DIAGNOSIS — Z9114 Patient's other noncompliance with medication regimen: Secondary | ICD-10-CM | POA: Diagnosis not present

## 2018-02-16 DIAGNOSIS — Z8673 Personal history of transient ischemic attack (TIA), and cerebral infarction without residual deficits: Secondary | ICD-10-CM | POA: Insufficient documentation

## 2018-02-16 DIAGNOSIS — E872 Acidosis: Secondary | ICD-10-CM | POA: Insufficient documentation

## 2018-02-16 DIAGNOSIS — N2581 Secondary hyperparathyroidism of renal origin: Secondary | ICD-10-CM | POA: Diagnosis not present

## 2018-02-16 DIAGNOSIS — D631 Anemia in chronic kidney disease: Secondary | ICD-10-CM | POA: Insufficient documentation

## 2018-02-16 DIAGNOSIS — K5900 Constipation, unspecified: Secondary | ICD-10-CM | POA: Insufficient documentation

## 2018-02-16 HISTORY — DX: Dependence on renal dialysis: N18.6

## 2018-02-16 HISTORY — DX: Dependence on renal dialysis: Z99.2

## 2018-02-16 LAB — CBC WITH DIFFERENTIAL/PLATELET
ABS IMMATURE GRANULOCYTES: 0 10*3/uL (ref 0.0–0.1)
BASOS ABS: 0.1 10*3/uL (ref 0.0–0.1)
Basophils Relative: 1 %
Eosinophils Absolute: 0.3 10*3/uL (ref 0.0–0.7)
Eosinophils Relative: 6 %
HCT: 30 % — ABNORMAL LOW (ref 39.0–52.0)
Hemoglobin: 9.9 g/dL — ABNORMAL LOW (ref 13.0–17.0)
Immature Granulocytes: 0 %
Lymphocytes Relative: 8 %
Lymphs Abs: 0.4 10*3/uL — ABNORMAL LOW (ref 0.7–4.0)
MCH: 32.1 pg (ref 26.0–34.0)
MCHC: 33 g/dL (ref 30.0–36.0)
MCV: 97.4 fL (ref 78.0–100.0)
MONO ABS: 0.4 10*3/uL (ref 0.1–1.0)
Monocytes Relative: 7 %
NEUTROS ABS: 3.9 10*3/uL (ref 1.7–7.7)
Neutrophils Relative %: 78 %
PLATELETS: 158 10*3/uL (ref 150–400)
RBC: 3.08 MIL/uL — AB (ref 4.22–5.81)
RDW: 15.6 % — ABNORMAL HIGH (ref 11.5–15.5)
WBC: 5 10*3/uL (ref 4.0–10.5)

## 2018-02-16 LAB — COMPREHENSIVE METABOLIC PANEL
ALBUMIN: 3.7 g/dL (ref 3.5–5.0)
ALK PHOS: 74 U/L (ref 38–126)
ALT: 13 U/L — ABNORMAL LOW (ref 17–63)
ANION GAP: 15 (ref 5–15)
AST: 9 U/L — ABNORMAL LOW (ref 15–41)
BUN: 44 mg/dL — ABNORMAL HIGH (ref 6–20)
CALCIUM: 9 mg/dL (ref 8.9–10.3)
CHLORIDE: 101 mmol/L (ref 101–111)
CO2: 25 mmol/L (ref 22–32)
Creatinine, Ser: 5.96 mg/dL — ABNORMAL HIGH (ref 0.61–1.24)
GFR calc non Af Amer: 9 mL/min — ABNORMAL LOW (ref >60)
GFR, EST AFRICAN AMERICAN: 11 mL/min — AB (ref >60)
Glucose, Bld: 90 mg/dL (ref 65–99)
POTASSIUM: 3.7 mmol/L (ref 3.5–5.1)
SODIUM: 141 mmol/L (ref 135–145)
Total Bilirubin: 1.1 mg/dL (ref 0.3–1.2)
Total Protein: 7.2 g/dL (ref 6.5–8.1)

## 2018-02-16 LAB — PROTIME-INR
INR: 1.12
PROTHROMBIN TIME: 14.3 s (ref 11.4–15.2)

## 2018-02-16 LAB — TROPONIN I
TROPONIN I: 0.1 ng/mL — AB (ref <0.03)
TROPONIN I: 0.09 ng/mL — AB (ref <0.03)

## 2018-02-16 LAB — CBG MONITORING, ED: GLUCOSE-CAPILLARY: 87 mg/dL (ref 65–99)

## 2018-02-16 LAB — TSH: TSH: 1.995 u[IU]/mL (ref 0.350–4.500)

## 2018-02-16 LAB — MAGNESIUM: Magnesium: 2.3 mg/dL (ref 1.7–2.4)

## 2018-02-16 LAB — BRAIN NATRIURETIC PEPTIDE: B NATRIURETIC PEPTIDE 5: 3688.3 pg/mL — AB (ref 0.0–100.0)

## 2018-02-16 MED ORDER — CINACALCET HCL 30 MG PO TABS
30.0000 mg | ORAL_TABLET | Freq: Every day | ORAL | Status: DC
  Administered 2018-02-16: 30 mg via ORAL
  Filled 2018-02-16: qty 1

## 2018-02-16 MED ORDER — SUCROFERRIC OXYHYDROXIDE 500 MG PO CHEW
1000.0000 mg | CHEWABLE_TABLET | Freq: Three times a day (TID) | ORAL | Status: DC
  Administered 2018-02-16: 1000 mg via ORAL
  Filled 2018-02-16 (×6): qty 2

## 2018-02-16 MED ORDER — ACETAMINOPHEN 650 MG RE SUPP: 650.0000 mg | Freq: Four times a day (QID) | RECTAL | Status: DC | PRN

## 2018-02-16 MED ORDER — NITROGLYCERIN 0.4 MG SL SUBL
SUBLINGUAL_TABLET | SUBLINGUAL | Status: AC
Start: 2018-02-16 — End: 2018-02-16
  Administered 2018-02-16: 0.4 mg
  Filled 2018-02-16: qty 1

## 2018-02-16 MED ORDER — METOPROLOL TARTRATE 100 MG PO TABS
100.0000 mg | ORAL_TABLET | Freq: Two times a day (BID) | ORAL | Status: DC
  Administered 2018-02-16 – 2018-02-17 (×2): 100 mg via ORAL
  Filled 2018-02-16 (×2): qty 1

## 2018-02-16 MED ORDER — NITROGLYCERIN 0.4 MG SL SUBL: 0.4000 mg | SUBLINGUAL_TABLET | SUBLINGUAL | Status: DC | PRN

## 2018-02-16 MED ORDER — VALACYCLOVIR HCL 500 MG PO TABS
500.0000 mg | ORAL_TABLET | Freq: Every day | ORAL | Status: DC
  Filled 2018-02-16: qty 1

## 2018-02-16 MED ORDER — ACETAMINOPHEN 325 MG PO TABS: 650.0000 mg | ORAL_TABLET | Freq: Four times a day (QID) | ORAL | Status: DC | PRN

## 2018-02-16 MED ORDER — HYDROCODONE-ACETAMINOPHEN 5-325 MG PO TABS
1.0000 | ORAL_TABLET | Freq: Once | ORAL | Status: AC
  Administered 2018-02-16: 1 via ORAL
  Filled 2018-02-16: qty 1

## 2018-02-16 MED ORDER — HEPARIN SODIUM (PORCINE) 5000 UNIT/ML IJ SOLN
5000.0000 [IU] | Freq: Three times a day (TID) | INTRAMUSCULAR | Status: DC
  Administered 2018-02-16 – 2018-02-17 (×2): 5000 [IU] via SUBCUTANEOUS
  Filled 2018-02-16 (×2): qty 1

## 2018-02-16 MED ORDER — ALBUTEROL SULFATE (2.5 MG/3ML) 0.083% IN NEBU: 3.0000 mL | INHALATION_SOLUTION | Freq: Four times a day (QID) | RESPIRATORY_TRACT | Status: DC | PRN

## 2018-02-16 MED ORDER — ASPIRIN 81 MG PO CHEW: 324.0000 mg | CHEWABLE_TABLET | Freq: Once | ORAL | Status: DC

## 2018-02-16 MED ORDER — METOPROLOL TARTRATE 25 MG PO TABS
100.0000 mg | ORAL_TABLET | Freq: Once | ORAL | Status: AC
  Administered 2018-02-16: 100 mg via ORAL
  Filled 2018-02-16: qty 4

## 2018-02-16 MED ORDER — PANTOPRAZOLE SODIUM 40 MG PO TBEC
40.0000 mg | DELAYED_RELEASE_TABLET | Freq: Every day | ORAL | Status: DC
  Administered 2018-02-16 – 2018-02-17 (×2): 40 mg via ORAL
  Filled 2018-02-16 (×2): qty 1

## 2018-02-16 MED ORDER — ROPINIROLE HCL 1 MG PO TABS
0.5000 mg | ORAL_TABLET | Freq: Two times a day (BID) | ORAL | Status: DC
  Administered 2018-02-16 – 2018-02-17 (×2): 0.5 mg via ORAL
  Filled 2018-02-16 (×3): qty 1

## 2018-02-16 MED ORDER — ASPIRIN 81 MG PO CHEW
81.0000 mg | CHEWABLE_TABLET | Freq: Every day | ORAL | Status: DC
  Administered 2018-02-17: 81 mg via ORAL
  Filled 2018-02-16: qty 1

## 2018-02-16 MED ORDER — VALACYCLOVIR HCL 500 MG PO TABS
1000.0000 mg | ORAL_TABLET | Freq: Once | ORAL | Status: AC
  Administered 2018-02-16: 1000 mg via ORAL
  Filled 2018-02-16 (×2): qty 2

## 2018-02-16 MED ORDER — SUCROFERRIC OXYHYDROXIDE 500 MG PO CHEW
1000.0000 mg | CHEWABLE_TABLET | Freq: Three times a day (TID) | ORAL | Status: DC
  Filled 2018-02-16: qty 2

## 2018-02-16 MED ORDER — HYDRALAZINE HCL 20 MG/ML IJ SOLN: 5.0000 mg | Freq: Four times a day (QID) | INTRAMUSCULAR | Status: DC | PRN

## 2018-02-16 MED ORDER — GI COCKTAIL ~~LOC~~: 30.0000 mL | Freq: Three times a day (TID) | ORAL | Status: DC | PRN

## 2018-02-16 MED ORDER — BENZONATATE 100 MG PO CAPS
100.0000 mg | ORAL_CAPSULE | Freq: Three times a day (TID) | ORAL | Status: DC | PRN
  Administered 2018-02-16: 100 mg via ORAL
  Filled 2018-02-16: qty 1

## 2018-02-16 MED ORDER — HYDRALAZINE HCL 20 MG/ML IJ SOLN
10.0000 mg | Freq: Once | INTRAMUSCULAR | Status: AC
  Administered 2018-02-16: 10 mg via INTRAVENOUS
  Filled 2018-02-16: qty 1

## 2018-02-16 MED ORDER — VALACYCLOVIR HCL 500 MG PO TABS: 500.0000 mg | ORAL_TABLET | Freq: Every day | ORAL | Status: DC

## 2018-02-16 MED ORDER — LABETALOL HCL 5 MG/ML IV SOLN
10.0000 mg | Freq: Once | INTRAVENOUS | Status: AC
  Administered 2018-02-16: 10 mg via INTRAVENOUS
  Filled 2018-02-16: qty 4

## 2018-02-16 MED ORDER — FUROSEMIDE 20 MG PO TABS
20.0000 mg | ORAL_TABLET | Freq: Every day | ORAL | Status: DC
  Administered 2018-02-16 – 2018-02-17 (×2): 20 mg via ORAL
  Filled 2018-02-16 (×2): qty 1

## 2018-02-16 NOTE — ED Notes (Signed)
Renal diet dinner tray ordered - w/ a 1500 fluid rest.

## 2018-02-16 NOTE — ED Triage Notes (Signed)
Pt reports CP started last night and continued thur this AM. Pt went to Dialysis and completed 2.1o hrs of treatment. Usual treatment time 3.45. EMS gave  324 ASA and 2 nitro Sl. Cp decreased 0/10. Pt has a cough since 08-2017.

## 2018-02-16 NOTE — ED Provider Notes (Addendum)
[facility] EMERGENCY DEPARTMENT Provider Note   CSN: [id number] Arrival date & time: 02/16/18  1023     History   Chief Complaint Chief Complaint  Patient presents with  . Chest Pain    HPI Thomas Robinson is a 57 y.o. male.  HPI Presents from dialysis after an episode of chest pain.  The pain is sore, pressure-like, severe. He notes that over the past few days he has had pain occasionally, and today, while about midway through a dialysis session had recurrence of severe pain across the anterior thorax. Some associated dyspnea, which he typically has. Currently pain has improved, without intervention beyond nitroglycerin. He denies lightheadedness, syncope, vomiting, diarrhea, fever, chills, cough. Patient has multiple medical issues including chronic kidney disease, asthma, hypertension.  Past Medical History:  Diagnosis Date  . Asthma   . Chronic kidney disease   . Hyperlipidemia   . Hypertension   . Stroke ([place])   . Symptomatic anemia 05/24/2017    Patient Active Problem List   Diagnosis Date Noted  . Respiratory failure, acute ([place]) 10/23/2017  . Volume overload 10/23/2017  . Asthma, chronic, unspecified asthma severity, with acute exacerbation 10/23/2017  . CAP (community acquired pneumonia) 10/23/2017  . Cough 08/23/2017  . Constipation   . CKD (chronic kidney disease), stage V ([place]) 05/24/2017  . Symptomatic anemia 05/24/2017  . Uremia 05/24/2017  . GI bleeding 05/24/2017  . Metabolic acidosis 59/56/3875  . Hypertensive urgency 05/24/2017  . Acute kidney injury superimposed on CKD ([place]) 05/24/2017  . Cerebral infarction due to thrombosis of cerebral artery ([place])   . AKI (acute kidney injury) ([place]) 04/10/2015  . History of completed stroke   . Essential hypertension 04/09/2015    Past Surgical History:  Procedure Laterality Date  . AV FISTULA PLACEMENT Left 05/28/2017   Procedure: LEFT ARM ARTERIOVENOUS (AV) FISTULA CREATION;   Surgeon: Conrad Bullhead, MD;  Location: [facility];  Service: Vascular;  Laterality: Left;  . IR FLUORO GUIDE CV LINE RIGHT  05/25/2017  . IR US GUIDE VASC ACCESS RIGHT  05/25/2017  . RINOPLASTY          Home Medications    Prior to Admission medications   Medication Sig Start Date End Date Taking? Authorizing Provider  acetaminophen (TYLENOL) 500 MG tablet Take 1 tablet (500 mg total) by mouth every 4 (four) hours as needed for mild pain or headache. Reported on 12/17/2015 10/24/17  Yes Park, Derenda Mis, MD  albuterol (PROVENTIL HFA;VENTOLIN HFA) 108 (90 BASE) MCG/ACT inhaler Inhale 1-2 puffs into the lungs every 6 (six) hours as needed for wheezing or shortness of breath. 04/12/15  Yes [name], MD  aspirin 81 MG chewable tablet Chew 1 tablet (81 mg total) by mouth daily. 04/12/15  Yes [name], MD  cinacalcet (SENSIPAR) 30 MG tablet Take 30 mg by mouth daily with supper. Do not take less than 12 hours prior to dialysis 01/25/18  Yes [provider]  furosemide (LASIX) 20 MG tablet Take 1 tablet (20 mg total) by mouth daily. 11/04/17  Yes Ena Dawley, Tiffany S, PA-C  hydrALAZINE (APRESOLINE) 100 MG tablet Take 1 tablet (100 mg total) by mouth 2 (two) times daily. 11/04/17  Yes Ena Dawley, Tiffany S, PA-C  metoprolol tartrate (LOPRESSOR) 100 MG tablet Take 1 tablet (100 mg total) by mouth 2 (two) times daily. 11/04/17  Yes Ena Dawley, Tiffany S, PA-C  multivitamin (RENA-VIT) TABS tablet Take 1 tablet by mouth daily. 01/13/18  Yes [provider]  rOPINIRole (REQUIP) 0.5 MG tablet Take 0.5 mg by mouth 2 (two) times daily. 01/26/18  Yes [provider]  sevelamer (RENAGEL) 800 MG tablet Take 800-1,600 mg by mouth See admin instructions. 1,600mg  by mouth three times daily and 800mg  twice daily with snacks 01/25/18  Yes [provider]  sucroferric oxyhydroxide (VELPHORO) 500 MG chewable tablet Chew 1,000 mg by mouth 3 (three) times daily with meals.   Yes [provider]  polyethylene glycol (MIRALAX / GLYCOLAX) packet Take 17 g by mouth daily as needed. Patient not taking: Reported on 11/04/2017 06/02/17   [name], MD    Family History Family History  Problem Relation Age of Onset  . Cancer Mother     Social History Social History   Tobacco Use  . Smoking status: Never Smoker  . Smokeless tobacco: Never Used  Substance Use Topics  . Alcohol use: No    Alcohol/week: 0.0 oz    Comment: fORMER DRINKER  . Drug use: No     Allergies   Patient has no known allergies.   Review of Systems Review of Systems  Constitutional:       Per HPI, otherwise negative  HENT:       Per HPI, otherwise negative  Respiratory:       Per HPI, otherwise negative  Cardiovascular:       Per HPI, otherwise negative  Gastrointestinal: Negative for vomiting.  Endocrine:       Negative aside from HPI  Genitourinary:       Neg aside from HPI   Musculoskeletal:       Per HPI, otherwise negative  Skin: Negative.   Allergic/Immunologic: Positive for immunocompromised state.  Neurological: Negative for syncope.     Physical Exam Updated Vital Signs BP (!) 199/101   Pulse 89   Temp 97.9 F (36.6 C) (Oral)   Resp (!) 21   Ht 5' 7.5" (1.715 m)   Wt 61.2 kg (135 lb)   SpO2 95%   BMI 20.83 kg/m   Physical Exam  Constitutional: He is oriented to person, place, and time. He appears well-developed. No distress.  unwell-appearing male awake and alert  HENT:  Head: Normocephalic and atraumatic.  Eyes: Conjunctivae and EOM are normal.  Cardiovascular: Normal rate and regular rhythm.  Pulmonary/Chest: Effort normal. No stridor. No respiratory distress.  Abdominal: He exhibits no distension.  Musculoskeletal: He exhibits no edema.  Left forearm graft with dialysis access catheters in place  Neurological: He is alert and oriented to person, place, and time.  Skin: Skin is warm and dry.  Psychiatric: He has a normal mood and affect.  Nursing  note and vitals reviewed.    ED Treatments / Results  Labs (all labs ordered are listed, but only abnormal results are displayed) Labs Reviewed  COMPREHENSIVE METABOLIC PANEL - Abnormal; Notable for the following components:      Result Value   BUN 44 (*)    Creatinine, Ser 5.96 (*)    AST 9 (*)    ALT 13 (*)    GFR calc non Af Amer 9 (*)    GFR calc Af Amer 11 (*)    All other components within normal limits  TROPONIN I - Abnormal; Notable for the following components:   Troponin I 0.10 (*)    All other components within normal limits  BRAIN NATRIURETIC PEPTIDE - Abnormal; Notable for the following components:   B Natriuretic Peptide 1,696.7 (*)  All other components within normal limits  CBC WITH DIFFERENTIAL/PLATELET - Abnormal; Notable for the following components:   RBC 3.08 (*)    Hemoglobin 9.9 (*)    HCT 30.0 (*)    RDW 15.6 (*)    Lymphs Abs 0.4 (*)    All other components within normal limits  MAGNESIUM  PROTIME-INR  CBG MONITORING, ED    EKG EKG Interpretation  Date/Time:  Tuesday February 16 2018 10:24:16 EDT Ventricular Rate:  105 PR Interval:    QRS Duration: 90 QT Interval:  374 QTC Calculation: 495 R Axis:   78 Text Interpretation:  Ectopic atrial tachycardia, unifocal Atrial premature complex LVH with secondary repolarization abnormality Borderline prolonged QT interval Abnormal ekg Confirmed by Carmin Muskrat [phone]) on 02/16/2018 10:43:21 AM   Radiology Dg Chest Portable 1 View  Result Date: 02/16/2018 CLINICAL DATA:  Chest pain, shortness of breath, and nausea for several days. History of asthma, previous CVA, chronic renal insufficiency. EXAM: PORTABLE CHEST 1 VIEW COMPARISON:  PA and lateral chest x-ray of February 07, 2018 FINDINGS: The lungs are adequately inflated. There is no focal infiltrate. The heart is top-normal in size. The central pulmonary vascularity is mildly prominent but stable. There may be a tiny left pleural effusion. There is  calcification in the wall of the aortic arch. The bony thorax exhibits no acute abnormality. IMPRESSION: No definite acute cardiopulmonary abnormality. Possible trace left pleural effusion not completely included in the field of view. There does not appear to have been significant interval change since the previous study. Electronically Signed   By: David  [name] M.D.   On: 02/16/2018 12:24    Procedures Procedures (including critical care time)  Medications Ordered in ED Medications  aspirin chewable tablet 324 mg (324 mg Oral Not Given 02/16/18 1256)  labetalol (NORMODYNE,TRANDATE) injection 10 mg (has no administration in time range)  HYDROcodone-acetaminophen (NORCO/VICODIN) 5-325 MG per tablet 1 tablet (1 tablet Oral Given 02/16/18 1410)  valACYclovir (VALTREX) tablet 1,000 mg (1,000 mg Oral Given 02/16/18 1441)  metoprolol tartrate (LOPRESSOR) tablet 100 mg (100 mg Oral Given 02/16/18 1441)  hydrALAZINE (APRESOLINE) injection 10 mg (10 mg Intravenous Given 02/16/18 1440)     Initial Impression / Assessment and Plan / ED Course  I have reviewed the triage vital signs and the nursing notes.  Pertinent labs & imaging results that were available during my care of the patient were reviewed by me and considered in my medical decision making (see chart for details).    Update:, Patient's chest pain has resolved, he now notes that he has a rash on the left side, and on further evaluation, it is clear the patient has shingles along a T12/L1 dermatome. Patient will receive Valtrex per Patient remains hypertensive, now notes that he has had inconsistent access to his blood pressure medication, he will be provided hydralazine and beta-blocker. 2:49 PM Labs notable for slight elevation in troponin, patient continues to deny persistent chest pain, having received initial aspirin, nitroglycerin, however, given his system blood pressure, in spite of initial hydralazine, beta-blocker, and with  consideration of his end-stage renal disease, risk profile for CAD, the patient will require admission for further evaluation and management.   Final Clinical Impressions(s) / ED Diagnoses  Atypical chest pain Hypertensive urgency Shingles   Carmin Muskrat, MD 02/16/18 1450   CRITICAL CARE Performed by: Carmin Muskrat Total critical care time: 35 minutes Critical care time was exclusive of separately billable procedures and treating other patients. Critical care was  necessary to treat or prevent imminent or life-threatening deterioration. Critical care was time spent personally by me on the following activities: development of treatment plan with patient and/or surrogate as well as nursing, discussions with consultants, evaluation of patient's response to treatment, examination of patient, obtaining history from patient or surrogate, ordering and performing treatments and interventions, ordering and review of laboratory studies, ordering and review of radiographic studies, pulse oximetry and re-evaluation of patient's condition.    Carmin Muskrat, MD 03/01/18 [phone]

## 2018-02-16 NOTE — H&P (Addendum)
Date: 02/16/2018               Patient Name:  Thomas Robinson MRN: [id number]  DOB: 09/18/1960 Age / Sex: 57 y.o., male   PCP: Patient, No Pcp Per         Medical Service: Internal Medicine Teaching Service         Attending Physician: Dr. Aldine Contes, MD    First Contact: Dr. Frederico Hamman  Pager: [phone]  Second Contact: Dr. Danford Bad  Pager: [phone]       After Hours (After 5p/  First Contact Pager: [phone]  weekends / holidays): Second Contact Pager: [phone]   Chief Complaint: Chest pain   History of Present Illness:  Thomas Robinson. Thomas Robinson is a 57 yo male with history of ESRD on HD T/Th/Sat, HTN, history of CVA in 2016 without residual deficits, restless leg syndrome, and asthma who presents to the ED from HD center with several medical complaints. He was in his usual state of health until last night when he felt a throbbing/pulsatile chest pain on the right side of his chest. He then went to bed and was able to sleep through the night without difficulty. He was feeling fine this morning and went to his HD session. After 3 hours in dialysis, he started to experience a headache and the same chest pain as the prior night. He describes as throbbing and moving to different places in his chest. Of note, description of pain changed multiple times during our encounter and patient stated he could feel his heart beating in multiple places in his body. He also reports neck pain. States he has good days and bad days. He is usually short of breath but yesterday felt more dyspneic than usual. States he does have chronic chest pain in the R side of his chest (though points to the L side), but his pain felt different, more like pressure which prompted him to come to the ED. He also feels his heart fluttering at times. He was given aspirin and SL nitro x1 which helped with the pain. He also endorses a throbbing HA, cough that is productive at times, sore throat, diffuse muscle cramps, and numbness in his upper  and lower extremities. These issues started after starting HD in 05/2017. Denies recent illness, fever, chills, vision changes, dyspnea, abdominal pain, N/V, urinary symptoms, or changes in bowel movements.   ED course: Patient was afebrile, non-tachycardic, hypertensive with BP 193/101, and oxygenating well on room air. Blood work consistent with ESRD, Hgb at baseline. BNP 3,688. Troponin 0.1. CXR unremarkable and EKG with ST but no signs of acute ischemia. He received metoprolol 100 mg x1, IV hydralazine 10 mg x1, and IV labetalol 10 mg x1. Also received Valtrex 1000 mg and Norco 5 mg.   Meds:  Current Meds  Medication Sig  . acetaminophen (TYLENOL) 500 MG tablet Take 1 tablet (500 mg total) by mouth every 4 (four) hours as needed for mild pain or headache. Reported on 12/17/2015  . albuterol (PROVENTIL HFA;VENTOLIN HFA) 108 (90 BASE) MCG/ACT inhaler Inhale 1-2 puffs into the lungs every 6 (six) hours as needed for wheezing or shortness of breath.  [name] aspirin 81 MG chewable tablet Chew 1 tablet (81 mg total) by mouth daily.  . cinacalcet (SENSIPAR) 30 MG tablet Take 30 mg by mouth daily with supper. Do not take less than 12 hours prior to dialysis  . furosemide (LASIX) 20 MG tablet Take 1 tablet (20 mg total)  by mouth daily.  . hydrALAZINE (APRESOLINE) 100 MG tablet Take 1 tablet (100 mg total) by mouth 2 (two) times daily.  . metoprolol tartrate (LOPRESSOR) 100 MG tablet Take 1 tablet (100 mg total) by mouth 2 (two) times daily.  . multivitamin (RENA-VIT) TABS tablet Take 1 tablet by mouth daily.  [name] rOPINIRole (REQUIP) 0.5 MG tablet Take 0.5 mg by mouth 2 (two) times daily.  . sevelamer (RENAGEL) 800 MG tablet Take 800-1,600 mg by mouth See admin instructions. 1,600mg  by mouth three times daily and 800mg  twice daily with snacks  . sucroferric oxyhydroxide (VELPHORO) 500 MG chewable tablet Chew 1,000 mg by mouth 3 (three) times daily with meals.    Allergies: Allergies as of 02/16/2018  . (No  Known Allergies)   Past Medical History:  Diagnosis Date  . Asthma   . Chronic kidney disease   . Hyperlipidemia   . Hypertension   . Stroke ([place])   . Symptomatic anemia 05/24/2017    Family History:  Family History  Problem Relation Age of Onset  . Cancer Mother     Social History: Patient lives at home with roommate who helps manage his medication. HE denies alcohol, tobacco, and drug use.   Review of Systems: A complete ROS was negative except as per HPI.   Physical Exam: Blood pressure (!) 164/79, pulse 81, temperature 97.9 F (36.6 C), temperature source Oral, resp. rate 19, height 5' 7.5" (1.715 m), weight 135 lb (61.2 kg), SpO2 95 %.  Physical Exam  Constitutional: He is oriented to person, place, and time.  Chronically ill appearing male, well-developed, sitting up in bed in no acute distress   HENT:  Head: Normocephalic and atraumatic.  Eyes: Conjunctivae are normal. No scleral icterus.  Neck: Normal range of motion. Neck supple.  Cardiovascular: Normal rate and regular rhythm. Exam reveals no gallop and no friction rub.  Murmur (II/VI SEM ) heard. Pulmonary/Chest: Effort normal and breath sounds normal. No respiratory distress. He has no wheezes. He has no rales.  Abdominal: Soft. Bowel sounds are normal. He exhibits no distension. There is no tenderness.  Musculoskeletal: He exhibits no edema.  Neurological: He is alert and oriented to person, place, and time.  Skin: Skin is warm and dry. Rash (Vesicular lesions in a dermatomal distribution in L1-L2) noted.    EKG: personally reviewed my interpretation is sinus tachycardia, peaked P waves consistent with atrial enlargement, prolonged QT at 454ms, no signs of acute ischemia   CXR: personally reviewed my interpretation is: no acute cardiopulmonary process noted, unable to visualize L hemidiaphragm completely   Assessment & Plan by Problem: Principal Problem:   Atypical chest pain Active Problems:   Essential  hypertension   Hypertensive urgency   ESRD (end stage renal disease) on dialysis [facility])  # Atypical chest pain  # Hypertensive urgency vs uncontrolled HTN Patient presents with R sided chest pain that he describes as throbbing and pressure like. EKG without acute signs of ischemia and troponin 0.1 which appears to be his baseline (ESRD patient). Unfortunately, his description of chest pain changed multiple times during our encounter and therefore difficult to assess. He also reports chronic R sided chest pain. He does have risk factors for ACS but I have a low suspicion for ACS at this time given negative work up thus far. I do believe this is likely secondary to uncontrolled HTN and will therefore treat for that. Differential also includes: GERD, pulmonary process, MSK, etc. He is on several antihypertensive  medications at home and reports compliance with them, though review of previous admission state long history of medication non-adherence.  - On telemetry  - Trending troponin  - EKG in AM  - Continue home metoprolol 100 mg BID and Lasix 20 mg QD  - Holding home hydralazine 100 mg BID - Ordered IV hydral 5 mg q6h PRN for 3 doses for sBP > 180  # ESRD: on HD T/Th/Sat. Does have a history of noncompliance with HD. Last session today which he did not complete (did only 3 hours due to HA and chest pain). Appears euvolemic on exam.  - Consult nephrology tomorrow - Continue home Sensipar and Velphoro  - BMP in AM   # Active localized shingles in L lower back: Patient presents with vesicular rash on dermatomal distribution as described in physical exam consistent with shingles. Mildly painful, no itchiness. Received Valtrex 1000 mg in the ED.  - Will continue renally-dosed valtrex at 500 mg QD, to be given after HD on HD days  - Ordered airborne precautions per RN request as I was informed this is protocol. However, patient does not need airborne precaution as this is not varicella zoster or  disseminated shingles. Localized shingles does not spread by airborne transmission.    F: none  E: monitoring  N: Renal diet   VTE ppx: SQ heparin   Code status: Full code, not confirmed on admission   Dispo: Admit patient to Observation with expected length of stay less than 2 midnights.  SignedWelford Roche, MD 02/16/2018, 4:53 PM  Pager: [phone]

## 2018-02-16 NOTE — Progress Notes (Signed)
Patient was received on floor from ED around 6:45pm.  During initial assessment I noticed  lesions on left flank with blisters.  Per patient doctor diagnosis with shingles.  Patient is not on any precautions at time of admission.  Paged MD 3 times to let know and need for airborne room.  We currently have an airborne room occupied.    Per MD-  Knew patient had active shingles but did not require an airborne precaution.    Per [facility] policy-     NOTE: For active chickenpox or shingles (localized or disseminated), smallpox, or extrapulmonary TB with draining lesions, patients should be placed on **Airborne and Contact Precautions**

## 2018-02-16 NOTE — ED Notes (Signed)
Pt removing BP Cuff

## 2018-02-17 ENCOUNTER — Observation Stay (HOSPITAL_BASED_OUTPATIENT_CLINIC_OR_DEPARTMENT_OTHER): Payer: Medicare Other

## 2018-02-17 ENCOUNTER — Other Ambulatory Visit: Payer: Self-pay

## 2018-02-17 DIAGNOSIS — Z79899 Other long term (current) drug therapy: Secondary | ICD-10-CM | POA: Diagnosis not present

## 2018-02-17 DIAGNOSIS — E785 Hyperlipidemia, unspecified: Secondary | ICD-10-CM | POA: Diagnosis not present

## 2018-02-17 DIAGNOSIS — R0789 Other chest pain: Secondary | ICD-10-CM

## 2018-02-17 DIAGNOSIS — I351 Nonrheumatic aortic (valve) insufficiency: Secondary | ICD-10-CM | POA: Diagnosis not present

## 2018-02-17 DIAGNOSIS — D631 Anemia in chronic kidney disease: Secondary | ICD-10-CM | POA: Diagnosis not present

## 2018-02-17 DIAGNOSIS — I120 Hypertensive chronic kidney disease with stage 5 chronic kidney disease or end stage renal disease: Secondary | ICD-10-CM | POA: Diagnosis not present

## 2018-02-17 DIAGNOSIS — N179 Acute kidney failure, unspecified: Secondary | ICD-10-CM | POA: Diagnosis not present

## 2018-02-17 DIAGNOSIS — Z8673 Personal history of transient ischemic attack (TIA), and cerebral infarction without residual deficits: Secondary | ICD-10-CM | POA: Diagnosis not present

## 2018-02-17 DIAGNOSIS — N186 End stage renal disease: Secondary | ICD-10-CM | POA: Diagnosis not present

## 2018-02-17 DIAGNOSIS — J45901 Unspecified asthma with (acute) exacerbation: Secondary | ICD-10-CM | POA: Diagnosis not present

## 2018-02-17 DIAGNOSIS — Z992 Dependence on renal dialysis: Secondary | ICD-10-CM | POA: Diagnosis not present

## 2018-02-17 DIAGNOSIS — I160 Hypertensive urgency: Secondary | ICD-10-CM | POA: Diagnosis not present

## 2018-02-17 DIAGNOSIS — Z7951 Long term (current) use of inhaled steroids: Secondary | ICD-10-CM | POA: Diagnosis not present

## 2018-02-17 LAB — BASIC METABOLIC PANEL
ANION GAP: 20 — AB (ref 5–15)
BUN: 57 mg/dL — ABNORMAL HIGH (ref 6–20)
CALCIUM: 8.9 mg/dL (ref 8.9–10.3)
CO2: 20 mmol/L — ABNORMAL LOW (ref 22–32)
Chloride: 102 mmol/L (ref 101–111)
Creatinine, Ser: 7.12 mg/dL — ABNORMAL HIGH (ref 0.61–1.24)
GFR calc Af Amer: 9 mL/min — ABNORMAL LOW (ref >60)
GFR, EST NON AFRICAN AMERICAN: 8 mL/min — AB (ref >60)
Glucose, Bld: 88 mg/dL (ref 65–99)
Potassium: 3.7 mmol/L (ref 3.5–5.1)
Sodium: 142 mmol/L (ref 135–145)

## 2018-02-17 LAB — LIPID PANEL
CHOL/HDL RATIO: 2.8 ratio
CHOLESTEROL: 90 mg/dL (ref 0–200)
HDL: 32 mg/dL — ABNORMAL LOW (ref >40)
LDL Cholesterol: 44 mg/dL (ref 0–99)
Triglycerides: 71 mg/dL (ref <150)
VLDL: 14 mg/dL (ref 0–40)

## 2018-02-17 LAB — ECHOCARDIOGRAM COMPLETE
HEIGHTINCHES: 67.5 in
Weight: 2014.4 oz

## 2018-02-17 LAB — TROPONIN I: TROPONIN I: 0.09 ng/mL — AB (ref <0.03)

## 2018-02-17 LAB — HEMOGLOBIN A1C
HEMOGLOBIN A1C: 5.1 % (ref 4.8–5.6)
MEAN PLASMA GLUCOSE: 99.67 mg/dL

## 2018-02-17 LAB — MRSA PCR SCREENING: MRSA BY PCR: POSITIVE — AB

## 2018-02-17 MED ORDER — HYDRALAZINE HCL 100 MG PO TABS: 100.0000 mg | ORAL_TABLET | Freq: Two times a day (BID) | ORAL | Status: DC

## 2018-02-17 NOTE — Progress Notes (Signed)
Pt did not urinate all night, asked several times, stated he doesn't need to, and he urinated right before transferring to room 4E16 from [place] last night. After requesting and educating, pt let me scan his bladder, scan was 517 cc. Pt stated he has to pee, refused to void in urinal, post void scan was 448 cc. Educated pt that at this point he will need in and out cath, pt stated " oh no, never put a cathter on me, its lot of pressure for me just to pee. I will do it in little bit, but no cath". Pt stated he is ready to go home because he told his admitting MD he is just here for one night. Will pass on to oncoming RN.

## 2018-02-17 NOTE — Progress Notes (Signed)
   Subjective:  Patient complained of chest pain overnight and received SL nitro x1 which improved the pain. EKG without acute ischemia and troponin at baseline. He reported dull chest pain when seen this morning and stated pressure from the TTE ultrasound probe helped with the pain. Reported no other complaints when seen.   Objective:  Vital signs in last 24 hours: Vitals:   02/16/18 2315 02/17/18 0520 02/17/18 0521 02/17/18 0926  BP: (!) 164/90 (!) 166/85 (!) 166/85 111/89  Pulse: 78 70  78  Resp: 15 16  12   Temp:  98.3 F (36.8 C)    TempSrc:  Oral    SpO2: 96% 96%    Weight:  125 lb 14.4 oz (57.1 kg)    Height:       Physical Exam  Constitutional: He is oriented to person, place, and time.  Thin, chronically ill appearing male laying in bed in no acute distress   Cardiovascular: Normal rate, regular rhythm and normal heart sounds. Exam reveals no gallop and no friction rub.  No murmur heard. Pulmonary/Chest: Effort normal and breath sounds normal. No respiratory distress. He has no wheezes. He has no rales.  Abdominal: Soft. Bowel sounds are normal. He exhibits no distension. There is no tenderness.  Musculoskeletal: He exhibits no edema.  Neurological: He is alert and oriented to person, place, and time.  Skin:  Shingles rash on L sided back    Assessment/Plan:  Principal Problem:   Atypical chest pain Active Problems:   Essential hypertension   Hypertensive urgency   ESRD (end stage renal disease) on dialysis [facility])  # Atypical chest pain  # Hypertensive urgency vs uncontrolled HTN Patient presents with non-specific R sided chest pain that is chronic in nature and changes in description. Thought to be secondary to demand ischemia in the setting of hypertensive urgency and expect improvement with better BP control. BP improving sBP in the 140-150s.  - On telemetry  - Continue home metoprolol 100 mg BID and Lasix 20 mg QD  - Resume home hydralazine 100 mg BID -  Stable for discharge home, but patient left AMA   # ESRD: on HD T/Th/Sat. Appears euvolemic on exam. Nephrology was not consulted. Patient left AMA.   # Active localized shingles in L lower back: on Valtrex 500 mg QD x 6 days. Plan for a 6 day course but patient left AMA.    Dispo: Anticipated discharge today.   Welford Roche, MD 02/17/2018, 12:49 PM Pager: [phone]

## 2018-02-17 NOTE — Progress Notes (Signed)
On call MD paged, MD called back. Notified pt complained of mid chest pressure pain  7/10. sublingual nitro x 1 given, 12 lead EKG showed SR with arrhythmia , placed pt on 2l o2 via [place]. Vitals updated to MD . Notified MD pt requested something stronger than tylenol for his head and neck pain. No orders received yet. Call light within reach. Pt's chest pain 2/10. Denied need for another dose of nitro. Will continue to monitor.

## 2018-02-17 NOTE — Progress Notes (Signed)
  Echocardiogram 2D Echocardiogram has been performed.  Thomas Robinson 02/17/2018, 8:55 AM

## 2018-02-17 NOTE — Progress Notes (Signed)
Pt complained of cough, and requested something for it, On call MD paged and received order for prn. See Mar for med administration. Will continue to monitor.

## 2018-02-17 NOTE — Progress Notes (Signed)
In to see patient. Patient is dressed removed telemetry box, and is ready to remove IV. Patient stated that he made a "deal with Dr. Vanita Panda that he would stay until noon today." Explained to patient that no discharge orders are placed as of yet. Patient stated that he didn't care and that he was leaving order or no order. Dr. Curly Rim.

## 2018-02-17 NOTE — Discharge Summary (Signed)
Name: Cleland Simkins MRN: [id number] DOB: 08/25/1961 57 y.o. PCP: Patient, No Pcp Per  Date of Admission: 02/16/2018 10:23 AM Date of Discharge: 02/17/2018 Attending Physician: Dr. Dareen Piano   Discharge Diagnosis: 1. Atypical chest pain  2. Hypertensive urgency  3. ESRD   Discharge Medications: Allergies as of 02/17/2018   No Known Allergies     Medication List    ASK your doctor about these medications   acetaminophen 500 MG tablet Commonly known as:  TYLENOL Take 1 tablet (500 mg total) by mouth every 4 (four) hours as needed for mild pain or headache. Reported on 12/17/2015   albuterol 108 (90 Base) MCG/ACT inhaler Commonly known as:  PROVENTIL HFA;VENTOLIN HFA Inhale 1-2 puffs into the lungs every 6 (six) hours as needed for wheezing or shortness of breath.   aspirin 81 MG chewable tablet Chew 1 tablet (81 mg total) by mouth daily.   cinacalcet 30 MG tablet Commonly known as:  SENSIPAR Take 30 mg by mouth daily with supper. Do not take less than 12 hours prior to dialysis   furosemide 20 MG tablet Commonly known as:  LASIX Take 1 tablet (20 mg total) by mouth daily.   hydrALAZINE 100 MG tablet Commonly known as:  APRESOLINE Take 1 tablet (100 mg total) by mouth 2 (two) times daily.   metoprolol tartrate 100 MG tablet Commonly known as:  LOPRESSOR Take 1 tablet (100 mg total) by mouth 2 (two) times daily.   multivitamin Tabs tablet Take 1 tablet by mouth daily.   polyethylene glycol packet Commonly known as:  MIRALAX / GLYCOLAX Take 17 g by mouth daily as needed.   rOPINIRole 0.5 MG tablet Commonly known as:  REQUIP Take 0.5 mg by mouth 2 (two) times daily.   sevelamer 800 MG tablet Commonly known as:  RENAGEL Take 800-1,600 mg by mouth See admin instructions. 1,600mg  by mouth three times daily and 800mg  twice daily with snacks   VELPHORO 500 MG chewable tablet Generic drug:  sucroferric oxyhydroxide Chew 1,000 mg by mouth 3 (three) times daily with  meals.       Disposition and follow-up:   Mr.Stokes Jamaal Bernasconi left [facility] AMA, but he was medically stable for discharge.  1.  Please assess for ongoing chest pain. Please assess for compliance with home BP medications. Please ensure he has follow up with his nephrologist. Please evaluate rash at L flank (presented with active shingles).   2.  Labs / imaging needed at time of follow-up: None   3.  Pending labs/ test needing follow-up: None   Follow-up Appointments: Patient left AMA prior to being discharged. Recommend follow up with both PCP and nephrologist.    Hospital Course by problem list: 1. Atypical chest pain 2. Hypertensive urgency  Patient presented with chest pain that he was unable to provide a clear description of. His EKG showed no signs of acute ischemia and troponin was 0.1 (his baseline). Telemetry did not show any arrhythmias. He was found to have a sBP in the 200s on admission and his chest pain was thought to be secondary to demand ischemia in the setting of uncontrolled HTN. He was counseled on importance of adherence to home antihypertensive medications as well as his hemodialysis. Patient left AMA on 6/19 but he was medically stable for discharge at the time.   3. ESRD: Patient only stayed one night and did not receive hemodialysis during this admission. He was advised to continue going to his outpatient  HD center at his regular schedule.   Discharge Vitals:   BP 111/89 (BP Location: Right Arm)   Pulse 78   Temp 98.3 F (36.8 C) (Oral)   Resp 12   Ht 5' 7.5" (1.715 m)   Wt 125 lb 14.4 oz (57.1 kg)   SpO2 96%   BMI 19.43 kg/m   Pertinent Labs, Studies, and Procedures:   CBC Latest Ref Rng & Units 02/16/2018 02/07/2018 10/24/2017  WBC 4.0 - 10.5 K/uL 5.0 5.0 7.5  Hemoglobin 13.0 - 17.0 g/dL 9.9(L) 11.5(L) 8.1(L)  Hematocrit 39.0 - 52.0 % 30.0(L) 35.1(L) 25.9(L)  Platelets 150 - 400 K/uL 158 203 214   BMP Latest Ref Rng & Units 02/17/2018  02/16/2018 02/07/2018  Glucose 65 - 99 mg/dL 88 90 112(H)  BUN 6 - 20 mg/dL 57(H) 44(H) 54(H)  Creatinine 0.61 - 1.24 mg/dL 7.12(H) 5.96(H) 6.92(H)  Sodium 135 - 145 mmol/L 142 141 140  Potassium 3.5 - 5.1 mmol/L 3.7 3.7 3.7  Chloride 101 - 111 mmol/L 102 101 98(L)  CO2 22 - 32 mmol/L 20(L) 25 24  Calcium 8.9 - 10.3 mg/dL 8.9 9.0 9.5   CXR 6/18: FINDINGS: The lungs are adequately inflated. There is no focal infiltrate. The heart is top-normal in size. The central pulmonary vascularity is mildly prominent but stable. There may be a tiny left pleural effusion. There is calcification in the wall of the aortic arch. The bony thorax exhibits no acute abnormality.  IMPRESSION: No definite acute cardiopulmonary abnormality. Possible trace left pleural effusion not completely included in the field of view. There does not appear to have been significant interval change since the previous study.  TTE 6/19: Study Conclusions - Left ventricle: The cavity size was normal. Wall thickness was   increased in a pattern of moderate LVH. Systolic function was   normal. The estimated ejection fraction was in the range of 55%   to 60%. Features are consistent with a pseudonormal left   ventricular filling pattern, with concomitant abnormal relaxation   and increased filling pressure (grade 2 diastolic dysfunction). - Aortic valve: There was mild regurgitation. Valve area (VTI):   1.72 cm^2. Valve area (Vmax): 1.37 cm^2. Valve area (Vmean): 1.61   cm^2. - Left atrium: The atrium was severely dilated. - Right atrium: The atrium was mildly dilated. - Pulmonary arteries: Systolic pressure was moderately to severely   increased. PA peak pressure: 67 mm Hg (S). - Pericardium, extracardiac: A trivial pericardial effusion was   identified.   Discharge Instructions:   Patient left AGAINST MEDICAL ADVICE (though medically stable for discharge) and no discharge instructions were provided.  However, during  AM rounds, patient was advised to continue taking his home blood pressure medications as usual and follow-up with both his PCP and his nephrologist.  He was also advised to continue hemodialysis at his regular outpatient hemodialysis center.   SignedWelford Roche, MD 02/21/2018, 2:44 PM   Pager: [phone]

## 2018-02-17 NOTE — Progress Notes (Signed)
Pt left AMA. Pt was not willing to stay and wait for discharge order. Telemetry box removed. MD notified by Surveyor, quantity. Pt left with all of his belongings. Pt escorted off unit by staff. Pt verbalized that he has transport arranged.    Ara Kussmaul BSN, RN

## 2018-02-17 NOTE — Progress Notes (Addendum)
  Date: 02/17/2018  Patient name: Thomas Robinson  Medical record number: [id number]  Date of birth: Nov 17, 1960   I have seen and evaluated [name] and discussed their care with the Residency Team.  In brief, patient is a 57 year old male with past medical history of end-stage renal disease on hemodialysis, hypertension, CVA, restless leg syndrome and asthma who presented to the ED with chest pain x1 day.  Patient states that yesterday he was feeling well in the morning and went for his hemodialysis session but started experiencing chest pain with an associated headache while he was there.  Chest pain was throbbing in nature and radiated to both her left and right sides of his chest.  Patient reports similar complaints on the night before this which resolved spontaneously.  Patient complained of worsening shortness of breath associated with the chest pain.  No fevers or chills, no lightheadedness, no syncope, no focal weakness, no tingling or numbness, no nausea or vomiting, diaphoresis, no diarrhea, no abdominal pain, no blurry vision.  Today patient states that his chest pain has resolved mostly.  He does have some mild pain over his sternum which he states improved when the ultrasound probe was placed against it for his 2D echo.  PMHx, Fam Hx, and/or Soc Hx : As per resident admit note  Vitals:   02/17/18 0521 02/17/18 0926  BP: (!) 166/85 111/89  Pulse:  78  Resp:  12  Temp:    SpO2:     General: Awake, alert, oriented x3, NAD CVS: Regular rate and rhythm, systolic murmur noted grade 3 out of 6 Lungs: CTA bilaterally Abdomen: Soft, nontender, nondistended, normoactive bowel sounds Extremities: No edema noted, patient noted to have a vesicular rash over the left side of his back in a dermatomal distribution consistent with shingles  Assessment and Plan: I have seen and evaluated the patient as outlined above. I agree with the formulated Assessment and Plan as detailed in the  residents' note, with the following changes:   1.  Chest pain: -Patient presented to the ED with nonspecific chest pain which radiated to both his right and left side in the setting of hypertensive urgency with SBP in the 200s.  EKG showed no acute ST/T wave changes but did show LV hypertrophy.  I suspect that the chest pain is secondary to his elevated blood pressures as well as an associated muscle skeletal component. -Patient's troponins remained stable at 0.1 which I suspect is secondary to his end-stage renal disease as well as mild demand ischemia from hypertensive urgency -His blood pressures are much improved today.  We will continue with home metoprolol, Lasix and hydralazine -His 2D echo results showed no focal wall motion abnormalities but did show elevated pulmonary arterial pressures as well as grade 2 diastolic dysfunction. -Patient left AMA prior to being discharged home.  He will need to follow-up with his PCP and nephrologist as an outpatient -Patient was also noted to have shingles in a dermatomal distribution in his left lower back.  Will need to complete course of Valtrex at home.  Aldine Contes, MD 6/19/20191:46 PM

## 2018-02-20 DIAGNOSIS — N186 End stage renal disease: Secondary | ICD-10-CM | POA: Diagnosis not present

## 2018-02-20 DIAGNOSIS — E876 Hypokalemia: Secondary | ICD-10-CM | POA: Diagnosis not present

## 2018-02-20 DIAGNOSIS — N2581 Secondary hyperparathyroidism of renal origin: Secondary | ICD-10-CM | POA: Diagnosis not present

## 2018-02-20 DIAGNOSIS — D631 Anemia in chronic kidney disease: Secondary | ICD-10-CM | POA: Diagnosis not present

## 2018-02-23 DIAGNOSIS — N2581 Secondary hyperparathyroidism of renal origin: Secondary | ICD-10-CM | POA: Diagnosis not present

## 2018-02-23 DIAGNOSIS — E876 Hypokalemia: Secondary | ICD-10-CM | POA: Diagnosis not present

## 2018-02-23 DIAGNOSIS — N186 End stage renal disease: Secondary | ICD-10-CM | POA: Diagnosis not present

## 2018-02-23 DIAGNOSIS — D631 Anemia in chronic kidney disease: Secondary | ICD-10-CM | POA: Diagnosis not present

## 2018-02-25 DIAGNOSIS — N2581 Secondary hyperparathyroidism of renal origin: Secondary | ICD-10-CM | POA: Diagnosis not present

## 2018-02-25 DIAGNOSIS — N186 End stage renal disease: Secondary | ICD-10-CM | POA: Diagnosis not present

## 2018-02-25 DIAGNOSIS — D631 Anemia in chronic kidney disease: Secondary | ICD-10-CM | POA: Diagnosis not present

## 2018-02-25 DIAGNOSIS — E876 Hypokalemia: Secondary | ICD-10-CM | POA: Diagnosis not present

## 2018-03-01 DIAGNOSIS — N186 End stage renal disease: Secondary | ICD-10-CM | POA: Diagnosis not present

## 2018-03-01 DIAGNOSIS — Z992 Dependence on renal dialysis: Secondary | ICD-10-CM | POA: Diagnosis not present

## 2018-03-01 DIAGNOSIS — I10 Essential (primary) hypertension: Secondary | ICD-10-CM | POA: Diagnosis not present

## 2018-03-02 DIAGNOSIS — N186 End stage renal disease: Secondary | ICD-10-CM | POA: Diagnosis not present

## 2018-03-02 DIAGNOSIS — D509 Iron deficiency anemia, unspecified: Secondary | ICD-10-CM | POA: Diagnosis not present

## 2018-03-02 DIAGNOSIS — D631 Anemia in chronic kidney disease: Secondary | ICD-10-CM | POA: Diagnosis not present

## 2018-03-02 DIAGNOSIS — E876 Hypokalemia: Secondary | ICD-10-CM | POA: Diagnosis not present

## 2018-03-02 DIAGNOSIS — N2581 Secondary hyperparathyroidism of renal origin: Secondary | ICD-10-CM | POA: Diagnosis not present

## 2018-03-06 DIAGNOSIS — D509 Iron deficiency anemia, unspecified: Secondary | ICD-10-CM | POA: Diagnosis not present

## 2018-03-06 DIAGNOSIS — N186 End stage renal disease: Secondary | ICD-10-CM | POA: Diagnosis not present

## 2018-03-06 DIAGNOSIS — E876 Hypokalemia: Secondary | ICD-10-CM | POA: Diagnosis not present

## 2018-03-06 DIAGNOSIS — N2581 Secondary hyperparathyroidism of renal origin: Secondary | ICD-10-CM | POA: Diagnosis not present

## 2018-03-06 DIAGNOSIS — D631 Anemia in chronic kidney disease: Secondary | ICD-10-CM | POA: Diagnosis not present

## 2018-03-09 DIAGNOSIS — D631 Anemia in chronic kidney disease: Secondary | ICD-10-CM | POA: Diagnosis not present

## 2018-03-09 DIAGNOSIS — N186 End stage renal disease: Secondary | ICD-10-CM | POA: Diagnosis not present

## 2018-03-09 DIAGNOSIS — D509 Iron deficiency anemia, unspecified: Secondary | ICD-10-CM | POA: Diagnosis not present

## 2018-03-09 DIAGNOSIS — N2581 Secondary hyperparathyroidism of renal origin: Secondary | ICD-10-CM | POA: Diagnosis not present

## 2018-03-09 DIAGNOSIS — E876 Hypokalemia: Secondary | ICD-10-CM | POA: Diagnosis not present

## 2018-03-11 DIAGNOSIS — N186 End stage renal disease: Secondary | ICD-10-CM | POA: Diagnosis not present

## 2018-03-11 DIAGNOSIS — E876 Hypokalemia: Secondary | ICD-10-CM | POA: Diagnosis not present

## 2018-03-11 DIAGNOSIS — N2581 Secondary hyperparathyroidism of renal origin: Secondary | ICD-10-CM | POA: Diagnosis not present

## 2018-03-11 DIAGNOSIS — D631 Anemia in chronic kidney disease: Secondary | ICD-10-CM | POA: Diagnosis not present

## 2018-03-11 DIAGNOSIS — D509 Iron deficiency anemia, unspecified: Secondary | ICD-10-CM | POA: Diagnosis not present

## 2018-03-13 DIAGNOSIS — N2581 Secondary hyperparathyroidism of renal origin: Secondary | ICD-10-CM | POA: Diagnosis not present

## 2018-03-13 DIAGNOSIS — D631 Anemia in chronic kidney disease: Secondary | ICD-10-CM | POA: Diagnosis not present

## 2018-03-13 DIAGNOSIS — E876 Hypokalemia: Secondary | ICD-10-CM | POA: Diagnosis not present

## 2018-03-13 DIAGNOSIS — D509 Iron deficiency anemia, unspecified: Secondary | ICD-10-CM | POA: Diagnosis not present

## 2018-03-13 DIAGNOSIS — N186 End stage renal disease: Secondary | ICD-10-CM | POA: Diagnosis not present

## 2018-03-16 DIAGNOSIS — M21372 Foot drop, left foot: Secondary | ICD-10-CM | POA: Diagnosis not present

## 2018-03-16 DIAGNOSIS — R0600 Dyspnea, unspecified: Secondary | ICD-10-CM | POA: Diagnosis not present

## 2018-03-16 DIAGNOSIS — Z8673 Personal history of transient ischemic attack (TIA), and cerebral infarction without residual deficits: Secondary | ICD-10-CM | POA: Diagnosis not present

## 2018-03-16 DIAGNOSIS — I129 Hypertensive chronic kidney disease with stage 1 through stage 4 chronic kidney disease, or unspecified chronic kidney disease: Secondary | ICD-10-CM | POA: Diagnosis not present

## 2018-03-16 DIAGNOSIS — N2581 Secondary hyperparathyroidism of renal origin: Secondary | ICD-10-CM | POA: Diagnosis not present

## 2018-03-16 DIAGNOSIS — Z992 Dependence on renal dialysis: Secondary | ICD-10-CM | POA: Diagnosis not present

## 2018-03-16 DIAGNOSIS — F5101 Primary insomnia: Secondary | ICD-10-CM | POA: Diagnosis not present

## 2018-03-16 DIAGNOSIS — N186 End stage renal disease: Secondary | ICD-10-CM | POA: Diagnosis not present

## 2018-03-16 DIAGNOSIS — R202 Paresthesia of skin: Secondary | ICD-10-CM | POA: Diagnosis not present

## 2018-03-16 DIAGNOSIS — G2581 Restless legs syndrome: Secondary | ICD-10-CM | POA: Diagnosis not present

## 2018-03-16 DIAGNOSIS — D509 Iron deficiency anemia, unspecified: Secondary | ICD-10-CM | POA: Diagnosis not present

## 2018-03-16 DIAGNOSIS — E876 Hypokalemia: Secondary | ICD-10-CM | POA: Diagnosis not present

## 2018-03-16 DIAGNOSIS — D631 Anemia in chronic kidney disease: Secondary | ICD-10-CM | POA: Diagnosis not present

## 2018-03-17 DIAGNOSIS — T82858A Stenosis of vascular prosthetic devices, implants and grafts, initial encounter: Secondary | ICD-10-CM | POA: Diagnosis not present

## 2018-03-17 DIAGNOSIS — N186 End stage renal disease: Secondary | ICD-10-CM | POA: Diagnosis not present

## 2018-03-17 DIAGNOSIS — Z992 Dependence on renal dialysis: Secondary | ICD-10-CM | POA: Diagnosis not present

## 2018-03-17 DIAGNOSIS — I871 Compression of vein: Secondary | ICD-10-CM | POA: Diagnosis not present

## 2018-03-18 DIAGNOSIS — D631 Anemia in chronic kidney disease: Secondary | ICD-10-CM | POA: Diagnosis not present

## 2018-03-18 DIAGNOSIS — D509 Iron deficiency anemia, unspecified: Secondary | ICD-10-CM | POA: Diagnosis not present

## 2018-03-18 DIAGNOSIS — N186 End stage renal disease: Secondary | ICD-10-CM | POA: Diagnosis not present

## 2018-03-18 DIAGNOSIS — E876 Hypokalemia: Secondary | ICD-10-CM | POA: Diagnosis not present

## 2018-03-18 DIAGNOSIS — N2581 Secondary hyperparathyroidism of renal origin: Secondary | ICD-10-CM | POA: Diagnosis not present

## 2018-03-23 DIAGNOSIS — N186 End stage renal disease: Secondary | ICD-10-CM | POA: Diagnosis not present

## 2018-03-23 DIAGNOSIS — N2581 Secondary hyperparathyroidism of renal origin: Secondary | ICD-10-CM | POA: Diagnosis not present

## 2018-03-23 DIAGNOSIS — D631 Anemia in chronic kidney disease: Secondary | ICD-10-CM | POA: Diagnosis not present

## 2018-03-23 DIAGNOSIS — D509 Iron deficiency anemia, unspecified: Secondary | ICD-10-CM | POA: Diagnosis not present

## 2018-03-23 DIAGNOSIS — E876 Hypokalemia: Secondary | ICD-10-CM | POA: Diagnosis not present

## 2018-03-25 DIAGNOSIS — N186 End stage renal disease: Secondary | ICD-10-CM | POA: Diagnosis not present

## 2018-03-25 DIAGNOSIS — E876 Hypokalemia: Secondary | ICD-10-CM | POA: Diagnosis not present

## 2018-03-25 DIAGNOSIS — D509 Iron deficiency anemia, unspecified: Secondary | ICD-10-CM | POA: Diagnosis not present

## 2018-03-25 DIAGNOSIS — N2581 Secondary hyperparathyroidism of renal origin: Secondary | ICD-10-CM | POA: Diagnosis not present

## 2018-03-25 DIAGNOSIS — D631 Anemia in chronic kidney disease: Secondary | ICD-10-CM | POA: Diagnosis not present

## 2018-03-27 DIAGNOSIS — D631 Anemia in chronic kidney disease: Secondary | ICD-10-CM | POA: Diagnosis not present

## 2018-03-27 DIAGNOSIS — E876 Hypokalemia: Secondary | ICD-10-CM | POA: Diagnosis not present

## 2018-03-27 DIAGNOSIS — N2581 Secondary hyperparathyroidism of renal origin: Secondary | ICD-10-CM | POA: Diagnosis not present

## 2018-03-27 DIAGNOSIS — D509 Iron deficiency anemia, unspecified: Secondary | ICD-10-CM | POA: Diagnosis not present

## 2018-03-27 DIAGNOSIS — N186 End stage renal disease: Secondary | ICD-10-CM | POA: Diagnosis not present

## 2018-03-29 DIAGNOSIS — Z8673 Personal history of transient ischemic attack (TIA), and cerebral infarction without residual deficits: Secondary | ICD-10-CM | POA: Diagnosis not present

## 2018-03-29 DIAGNOSIS — G2581 Restless legs syndrome: Secondary | ICD-10-CM | POA: Diagnosis not present

## 2018-03-29 DIAGNOSIS — N186 End stage renal disease: Secondary | ICD-10-CM | POA: Diagnosis not present

## 2018-03-29 DIAGNOSIS — R0789 Other chest pain: Secondary | ICD-10-CM | POA: Diagnosis not present

## 2018-03-29 DIAGNOSIS — I129 Hypertensive chronic kidney disease with stage 1 through stage 4 chronic kidney disease, or unspecified chronic kidney disease: Secondary | ICD-10-CM | POA: Diagnosis not present

## 2018-03-29 DIAGNOSIS — Z992 Dependence on renal dialysis: Secondary | ICD-10-CM | POA: Diagnosis not present

## 2018-03-29 DIAGNOSIS — I5043 Acute on chronic combined systolic (congestive) and diastolic (congestive) heart failure: Secondary | ICD-10-CM | POA: Diagnosis not present

## 2018-03-29 DIAGNOSIS — K219 Gastro-esophageal reflux disease without esophagitis: Secondary | ICD-10-CM | POA: Diagnosis not present

## 2018-03-29 DIAGNOSIS — R1114 Bilious vomiting: Secondary | ICD-10-CM | POA: Diagnosis not present

## 2018-03-30 DIAGNOSIS — D631 Anemia in chronic kidney disease: Secondary | ICD-10-CM | POA: Diagnosis not present

## 2018-03-30 DIAGNOSIS — N2581 Secondary hyperparathyroidism of renal origin: Secondary | ICD-10-CM | POA: Diagnosis not present

## 2018-03-30 DIAGNOSIS — E876 Hypokalemia: Secondary | ICD-10-CM | POA: Diagnosis not present

## 2018-03-30 DIAGNOSIS — N186 End stage renal disease: Secondary | ICD-10-CM | POA: Diagnosis not present

## 2018-03-30 DIAGNOSIS — D509 Iron deficiency anemia, unspecified: Secondary | ICD-10-CM | POA: Diagnosis not present

## 2018-04-01 DIAGNOSIS — D509 Iron deficiency anemia, unspecified: Secondary | ICD-10-CM | POA: Diagnosis not present

## 2018-04-01 DIAGNOSIS — E876 Hypokalemia: Secondary | ICD-10-CM | POA: Diagnosis not present

## 2018-04-01 DIAGNOSIS — Z992 Dependence on renal dialysis: Secondary | ICD-10-CM | POA: Diagnosis not present

## 2018-04-01 DIAGNOSIS — Z23 Encounter for immunization: Secondary | ICD-10-CM | POA: Diagnosis not present

## 2018-04-01 DIAGNOSIS — N186 End stage renal disease: Secondary | ICD-10-CM | POA: Diagnosis not present

## 2018-04-01 DIAGNOSIS — I10 Essential (primary) hypertension: Secondary | ICD-10-CM | POA: Diagnosis not present

## 2018-04-01 DIAGNOSIS — N2581 Secondary hyperparathyroidism of renal origin: Secondary | ICD-10-CM | POA: Diagnosis not present

## 2018-04-01 DIAGNOSIS — D631 Anemia in chronic kidney disease: Secondary | ICD-10-CM | POA: Diagnosis not present

## 2018-04-03 DIAGNOSIS — Z23 Encounter for immunization: Secondary | ICD-10-CM | POA: Diagnosis not present

## 2018-04-03 DIAGNOSIS — E876 Hypokalemia: Secondary | ICD-10-CM | POA: Diagnosis not present

## 2018-04-03 DIAGNOSIS — D631 Anemia in chronic kidney disease: Secondary | ICD-10-CM | POA: Diagnosis not present

## 2018-04-03 DIAGNOSIS — N2581 Secondary hyperparathyroidism of renal origin: Secondary | ICD-10-CM | POA: Diagnosis not present

## 2018-04-03 DIAGNOSIS — D509 Iron deficiency anemia, unspecified: Secondary | ICD-10-CM | POA: Diagnosis not present

## 2018-04-03 DIAGNOSIS — N186 End stage renal disease: Secondary | ICD-10-CM | POA: Diagnosis not present

## 2018-04-06 DIAGNOSIS — D509 Iron deficiency anemia, unspecified: Secondary | ICD-10-CM | POA: Diagnosis not present

## 2018-04-06 DIAGNOSIS — N2581 Secondary hyperparathyroidism of renal origin: Secondary | ICD-10-CM | POA: Diagnosis not present

## 2018-04-06 DIAGNOSIS — D631 Anemia in chronic kidney disease: Secondary | ICD-10-CM | POA: Diagnosis not present

## 2018-04-06 DIAGNOSIS — Z23 Encounter for immunization: Secondary | ICD-10-CM | POA: Diagnosis not present

## 2018-04-06 DIAGNOSIS — N186 End stage renal disease: Secondary | ICD-10-CM | POA: Diagnosis not present

## 2018-04-06 DIAGNOSIS — E876 Hypokalemia: Secondary | ICD-10-CM | POA: Diagnosis not present

## 2018-04-08 ENCOUNTER — Other Ambulatory Visit: Payer: Self-pay

## 2018-04-08 DIAGNOSIS — D509 Iron deficiency anemia, unspecified: Secondary | ICD-10-CM | POA: Diagnosis not present

## 2018-04-08 DIAGNOSIS — E876 Hypokalemia: Secondary | ICD-10-CM | POA: Diagnosis not present

## 2018-04-08 DIAGNOSIS — N186 End stage renal disease: Secondary | ICD-10-CM | POA: Diagnosis not present

## 2018-04-08 DIAGNOSIS — N2581 Secondary hyperparathyroidism of renal origin: Secondary | ICD-10-CM | POA: Diagnosis not present

## 2018-04-08 DIAGNOSIS — Z23 Encounter for immunization: Secondary | ICD-10-CM | POA: Diagnosis not present

## 2018-04-08 DIAGNOSIS — D631 Anemia in chronic kidney disease: Secondary | ICD-10-CM | POA: Diagnosis not present

## 2018-04-09 ENCOUNTER — Encounter: Payer: Self-pay | Admitting: Cardiology

## 2018-04-09 ENCOUNTER — Ambulatory Visit (INDEPENDENT_AMBULATORY_CARE_PROVIDER_SITE_OTHER): Payer: Medicare Other | Admitting: Cardiology

## 2018-04-09 ENCOUNTER — Encounter (INDEPENDENT_AMBULATORY_CARE_PROVIDER_SITE_OTHER): Payer: Self-pay

## 2018-04-09 VITALS — BP 150/60 | HR 60 | Ht 67.5 in | Wt 137.8 lb

## 2018-04-09 DIAGNOSIS — N186 End stage renal disease: Secondary | ICD-10-CM | POA: Diagnosis not present

## 2018-04-09 DIAGNOSIS — R9431 Abnormal electrocardiogram [ECG] [EKG]: Secondary | ICD-10-CM | POA: Diagnosis not present

## 2018-04-09 DIAGNOSIS — I5032 Chronic diastolic (congestive) heart failure: Secondary | ICD-10-CM

## 2018-04-09 NOTE — Progress Notes (Signed)
Cardiology Office Note:    Date:  04/09/2018   ID:  Thomas Robinson, DOB 1961-05-22, MRN [id number]  PCP:  Patient, No Pcp Per  Cardiologist:  No primary care provider on file.   Referring MD: No ref. provider found     History of Present Illness:    Thomas Robinson is a 57 y.o. male is here for the evaluation of diastolic heart failure at the request of Dr. Frederico Hamman.  Has end-stage renal disease on hemodialysis, difficult to control hypertension and was recently hospitalized in June 2019 with hypertensive urgency, low level troponin 0.1 at baseline with blood pressures in the 200s.  He left AMA.  His echocardiogram in June 2019 showed normal EF 55 to 60% with mild aortic valve regurgitation, severely dilated left atrium and elevated pulmonary pressures of 67 mmHg.  BNP was 4000.  This is not clinically valuable in the setting of end-stage renal disease.  When taking a deep breath feels more pressure in his left chest intermittent, moderate in severity. Like congestion. Mild continuous pressure. When climbing stairs would probably kill me. Eat cereal cough. Pain. Hack up silky congestion. Hurts with cough.   - LDL 32.  Creatinine 6.6.  Hemoglobin 9.0.   Past Medical History:  Diagnosis Date  . Acute kidney injury superimposed on CKD ([place]) 05/24/2017  . AKI (acute kidney injury) ([place]) 04/10/2015  . Asthma   . Asthma, chronic, unspecified asthma severity, with acute exacerbation 10/23/2017  . Atypical chest pain 02/16/2018  . CAP (community acquired pneumonia) 10/23/2017  . Cerebral infarction due to thrombosis of cerebral artery ([place])   . Cerebrovascular accident (CVA) ([place])   . Chronic kidney disease   . CKD (chronic kidney disease), stage V ([place]) 05/24/2017  . ESRD (end stage renal disease) on dialysis ([place]) 02/16/2018  . Essential hypertension 04/09/2015  . GI bleeding 05/24/2017  . History of completed stroke   . Hyperlipidemia   . Hypertension   . Hypertensive urgency 05/24/2017    . Kidney failure   . Respiratory failure, acute ([place]) 10/23/2017  . Restless leg syndrome   . Stroke ([place])   . Symptomatic anemia 05/24/2017    Past Surgical History:  Procedure Laterality Date  . AV FISTULA PLACEMENT Left 05/28/2017   Procedure: LEFT ARM ARTERIOVENOUS (AV) FISTULA CREATION;  Surgeon: Conrad Coalmont, MD;  Location: [facility];  Service: Vascular;  Laterality: Left;  . IR FLUORO GUIDE CV LINE RIGHT  05/25/2017  . IR US GUIDE VASC ACCESS RIGHT  05/25/2017  . RINOPLASTY      Current Medications: Current Meds  Medication Sig  . acetaminophen (TYLENOL) 500 MG tablet Take 1 tablet (500 mg total) by mouth every 4 (four) hours as needed for mild pain or headache. Reported on 12/17/2015  . albuterol (PROVENTIL HFA;VENTOLIN HFA) 108 (90 BASE) MCG/ACT inhaler Inhale 1-2 puffs into the lungs every 6 (six) hours as needed for wheezing or shortness of breath.  . cinacalcet (SENSIPAR) 30 MG tablet Take 30 mg by mouth daily with supper. Do not take less than 12 hours prior to dialysis  . clonazePAM (KLONOPIN) 0.5 MG tablet Take 0.5 mg by mouth daily. Take one (1)  0.5mg   Tablet by mouth daily at bedtime.  . gabapentin (NEURONTIN) 100 MG capsule Take 100 mg by mouth daily. Take one (1) 100 mg capsule by mouth daily at bedtime.  . hydrALAZINE (APRESOLINE) 100 MG tablet Take 1 tablet (100 mg total) by mouth 2 (two) times daily.  [name]  metoCLOPramide (REGLAN) 5 MG tablet Take 5 tablets by mouth as needed.  . metoprolol tartrate (LOPRESSOR) 100 MG tablet Take 1 tablet (100 mg total) by mouth 2 (two) times daily.  . multivitamin (RENA-VIT) TABS tablet Take 1 tablet by mouth daily.  . multivitamin (RENA-VIT) TABS tablet Take 1 tablet by mouth daily. Take one tablet by mouth daily.  . pantoprazole (PROTONIX) 40 MG tablet Take 40 mg by mouth daily.  . polyethylene glycol (MIRALAX / GLYCOLAX) packet Take 17 g by mouth daily as needed.  [name] rOPINIRole (REQUIP) 0.5 MG tablet Take 0.5 mg by mouth 2 (two) times  daily.  . sevelamer (RENAGEL) 800 MG tablet Take 800-1,600 mg by mouth See admin instructions. 1,600mg  by mouth three times daily and 800mg  twice daily with snacks     Allergies:   Patient has no known allergies.   Social History   Socioeconomic History  . Marital status: Single    Spouse name: Not on file  . Number of children: Not on file  . Years of education: Not on file  . Highest education level: Not on file  Occupational History  . Not on file  Social Needs  . Financial resource strain: Not on file  . Food insecurity:    Worry: Not on file    Inability: Not on file  . Transportation needs:    Medical: Not on file    Non-medical: Not on file  Tobacco Use  . Smoking status: Never Smoker  . Smokeless tobacco: Never Used  Substance and Sexual Activity  . Alcohol use: No    Alcohol/week: 0.0 standard drinks    Comment: fORMER DRINKER  . Drug use: No  . Sexual activity: Not on file  Lifestyle  . Physical activity:    Days per week: Not on file    Minutes per session: Not on file  . Stress: Not on file  Relationships  . Social connections:    Talks on phone: Not on file    Gets together: Not on file    Attends religious service: Not on file    Active member of club or organization: Not on file    Attends meetings of clubs or organizations: Not on file    Relationship status: Not on file  Other Topics Concern  . Not on file  Social History Narrative   Lives with a roommate in a one story home.  Has 1 child.  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  Education: high school.     Family History: The patient's family history includes Cancer in his mother.  ROS:   Please see the history of present illness.    No bleeding fevers chills nausea vomiting syncope all other systems reviewed and are negative.  EKGs/Labs/Other Studies Reviewed:    The following studies were reviewed today:  TTE 6/19: Study Conclusions - Left ventricle: The cavity size was normal. Wall  thickness was increased in a pattern of moderate LVH. Systolic function was normal. The estimated ejection fraction was in the range of 55% to 60%. Features are consistent with a pseudonormal left ventricular filling pattern, with concomitant abnormal relaxation and increased filling pressure (grade 2 diastolic dysfunction). - Aortic valve: There was mild regurgitation. Valve area (VTI): 1.72 cm^2. Valve area (Vmax): 1.37 cm^2. Valve area (Vmean): 1.61 cm^2. - Left atrium: The atrium was severely dilated. - Right atrium: The atrium was mildly dilated. - Pulmonary arteries: Systolic pressure was moderately to severely increased. PA peak  pressure: 67 mm Hg (S). - Pericardium, extracardiac: A trivial pericardial effusion was identified.  EKG: June 2019 sinus rhythm deep T wave inversions LVH V4 through V6.  Recent Labs: 02/16/2018: ALT 13; B Natriuretic Peptide 3,688.3; Hemoglobin 9.9; Magnesium 2.3; Platelets 158; TSH 1.995 02/17/2018: BUN 57; Creatinine, Ser 7.12; Potassium 3.7; Sodium 142  Recent Lipid Panel    Component Value Date/Time   CHOL 90 02/17/2018 0345   TRIG 71 02/17/2018 0345   HDL 32 (L) 02/17/2018 0345   CHOLHDL 2.8 02/17/2018 0345   VLDL 14 02/17/2018 0345   LDLCALC 44 02/17/2018 0345    Physical Exam:    VS:  BP (!) 150/60   Pulse 60   Ht 5' 7.5" (1.715 m)   Wt 137 lb 12.8 oz (62.5 kg)   SpO2 96%   BMI 21.26 kg/m     Wt Readings from Last 3 Encounters:  04/09/18 137 lb 12.8 oz (62.5 kg)  02/17/18 125 lb 14.4 oz (57.1 kg)  02/07/18 135 lb (61.2 kg)     GEN: Thin Well nourished, well developed in no acute distress HEENT: Normal NECK: No JVD; No carotid bruits LYMPHATICS: No lymphadenopathy CARDIAC: RRR, no murmurs, rubs, gallops, left arm fistula RESPIRATORY:  Clear to auscultation without rales, wheezing or rhonchi  ABDOMEN: Soft, non-tender, non-distended MUSCULOSKELETAL:  Trace LE edema; No deformity  SKIN: Warm and  dry NEUROLOGIC:  Alert and oriented x 3 PSYCHIATRIC:  Normal affect   ASSESSMENT:    1. Chronic diastolic heart failure ([place])   2. ESRD (end stage renal disease) ([place])   3. Abnormal ECG    PLAN:    In order of problems listed above:  Diastolic dysfunction -Normal ejection fraction on echocardiogram.  No evidence of systolic heart failure.  Continue with fluid management per hemodialysis.  Continue with blood pressure management per nephrology.  Abnormal EKG - LVH with T wave inversion noted in V4 through V6.  LVH morphology.  Chest pain -Nitroglycerin x1 improve the pain.  Dull.  Pressure from the ultrasound probe getting echocardiogram relieve the discomfort.  This was during hospitalization in June. -We will set up for coronary CT scan with FFR to further evaluate his coronary arteries given his risk factors especially hemodialysis.  He has both typical as well as atypical features.  Cough - CT scan may be helpful.  If this is fluid related, continue to pull adequate amounts during hemodialysis.  Medication Adjustments/Labs and Tests Ordered: Current medicines are reviewed at length with the patient today.  Concerns regarding medicines are outlined above.  Orders Placed This Encounter  Procedures  . CT CORONARY MORPH W/CTA COR W/SCORE W/CA W/CM &/OR WO/CM  . CT CORONARY FRACTIONAL FLOW RESERVE DATA PREP  . CT CORONARY FRACTIONAL FLOW RESERVE FLUID ANALYSIS  . Basic metabolic panel   No orders of the defined types were placed in this encounter.   Patient Instructions  Medication Instructions:   Your physician recommends that you continue on your current medications as directed. Please refer to the Current Medication list given to you today.   If you need a refill on your cardiac medications before your next appointment, please call your pharmacy.  Labwork:  BMET  NEED TO BE SCHEDULED PRIOR TO TEST    Testing/Procedures:  Non-Cardiac CT Angiography (CTA), is a  special type of CT scan that uses a computer to produce multi-dimensional views of major blood vessels throughout the body. In CT angiography, a contrast material is injected through an  IV to help visualize the blood vessels   Follow-Up: CONTACT [facility] [phone] AS NEEDED FOR  ANY CARDIAC RELATED SYMPTOMS    Any Other Special Instructions Will Be Listed Below (If Applicable).  Please arrive at the [facility] main entrance of [facility] at xx:xx AM (30-45 minutes prior to test start time)  [facility] [place], [place] [address] [phone]  Proceed to the [facility] Radiology Department (First Floor).  Please follow these instructions carefully (unless otherwise directed):  Hold all erectile dysfunction medications at least 48 hours prior to test.  On the Night Before the Test: . Drink plenty of water. . Do not consume any caffeinated/decaffeinated beverages or chocolate 12 hours prior to your test. . Do not take any antihistamines 12 hours prior to your test. On the Day of the Test: . Drink plenty of water. Do not drink any water within one hour of the test. . Do not eat any food 4 hours prior to the test. . You may take your regular medications prior to the test.  After the Test: . Drink plenty of water. . After receiving IV contrast, you may experience a mild flushed feeling. This is normal. . On occasion, you may experience a mild rash up to 24 hours after the test. This is not dangerous. If this occurs, you can take Benadryl 25 mg and increase your fluid intake. . If you experience trouble breathing, this can be serious. If it is severe call 911 IMMEDIATELY. If it is mild, please call our office. . If you take any of these medications: Glipizide/Metformin, Avandament, Glucavance, please do not take 48 hours after completing test.                                                                                                                                                     Signed, Candee Furbish, MD  04/09/2018 9:08 AM    [place]

## 2018-04-09 NOTE — Patient Instructions (Addendum)
Medication Instructions:   Your physician recommends that you continue on your current medications as directed. Please refer to the Current Medication list given to you today.   If you need a refill on your cardiac medications before your next appointment, please call your pharmacy.  Labwork:  BMET  NEED TO BE SCHEDULED PRIOR TO TEST    Testing/Procedures:  Non-Cardiac CT Angiography (CTA), is a special type of CT scan that uses a computer to produce multi-dimensional views of major blood vessels throughout the body. In CT angiography, a contrast material is injected through an IV to help visualize the blood vessels   Follow-Up: CONTACT [facility] [phone] AS NEEDED FOR  ANY CARDIAC RELATED SYMPTOMS    Any Other Special Instructions Will Be Listed Below (If Applicable).  Please arrive at the [facility] main entrance of [facility] at xx:xx AM (30-45 minutes prior to test start time)  [facility] [place], [place] [address] [phone]  Proceed to the [facility] Radiology Department (First Floor).  Please follow these instructions carefully (unless otherwise directed):  Hold all erectile dysfunction medications at least 48 hours prior to test.  On the Night Before the Test: . Drink plenty of water. . Do not consume any caffeinated/decaffeinated beverages or chocolate 12 hours prior to your test. . Do not take any antihistamines 12 hours prior to your test. On the Day of the Test: . Drink plenty of water. Do not drink any water within one hour of the test. . Do not eat any food 4 hours prior to the test. . You may take your regular medications prior to the test.  After the Test: . Drink plenty of water. . After receiving IV contrast, you may experience a mild flushed feeling. This is normal. . On occasion, you may experience a mild rash up to 24 hours after the test. This is not dangerous. If this occurs, you can take Benadryl  25 mg and increase your fluid intake. . If you experience trouble breathing, this can be serious. If it is severe call 911 IMMEDIATELY. If it is mild, please call our office. . If you take any of these medications: Glipizide/Metformin, Avandament, Glucavance, please do not take 48 hours after completing test.

## 2018-04-10 DIAGNOSIS — N186 End stage renal disease: Secondary | ICD-10-CM | POA: Diagnosis not present

## 2018-04-10 DIAGNOSIS — E876 Hypokalemia: Secondary | ICD-10-CM | POA: Diagnosis not present

## 2018-04-10 DIAGNOSIS — D509 Iron deficiency anemia, unspecified: Secondary | ICD-10-CM | POA: Diagnosis not present

## 2018-04-10 DIAGNOSIS — Z23 Encounter for immunization: Secondary | ICD-10-CM | POA: Diagnosis not present

## 2018-04-10 DIAGNOSIS — N2581 Secondary hyperparathyroidism of renal origin: Secondary | ICD-10-CM | POA: Diagnosis not present

## 2018-04-10 DIAGNOSIS — D631 Anemia in chronic kidney disease: Secondary | ICD-10-CM | POA: Diagnosis not present

## 2018-04-13 ENCOUNTER — Encounter (HOSPITAL_COMMUNITY): Payer: Self-pay

## 2018-04-13 ENCOUNTER — Observation Stay (HOSPITAL_COMMUNITY)
Admission: EM | Admit: 2018-04-13 | Discharge: 2018-04-15 | Disposition: A | Discharge status: Home / Self Care | Payer: Medicare Other | Attending: Internal Medicine | Admitting: Internal Medicine

## 2018-04-13 ENCOUNTER — Emergency Department (HOSPITAL_COMMUNITY): Payer: Medicare Other

## 2018-04-13 DIAGNOSIS — I10 Essential (primary) hypertension: Secondary | ICD-10-CM | POA: Diagnosis not present

## 2018-04-13 DIAGNOSIS — I5033 Acute on chronic diastolic (congestive) heart failure: Secondary | ICD-10-CM | POA: Diagnosis not present

## 2018-04-13 DIAGNOSIS — Z7982 Long term (current) use of aspirin: Secondary | ICD-10-CM | POA: Insufficient documentation

## 2018-04-13 DIAGNOSIS — Z79899 Other long term (current) drug therapy: Secondary | ICD-10-CM | POA: Insufficient documentation

## 2018-04-13 DIAGNOSIS — E876 Hypokalemia: Secondary | ICD-10-CM | POA: Diagnosis not present

## 2018-04-13 DIAGNOSIS — Z8673 Personal history of transient ischemic attack (TIA), and cerebral infarction without residual deficits: Secondary | ICD-10-CM | POA: Insufficient documentation

## 2018-04-13 DIAGNOSIS — I5032 Chronic diastolic (congestive) heart failure: Secondary | ICD-10-CM

## 2018-04-13 DIAGNOSIS — N186 End stage renal disease: Secondary | ICD-10-CM | POA: Insufficient documentation

## 2018-04-13 DIAGNOSIS — R079 Chest pain, unspecified: Secondary | ICD-10-CM | POA: Diagnosis present

## 2018-04-13 DIAGNOSIS — D539 Nutritional anemia, unspecified: Secondary | ICD-10-CM | POA: Diagnosis not present

## 2018-04-13 DIAGNOSIS — N179 Acute kidney failure, unspecified: Secondary | ICD-10-CM | POA: Insufficient documentation

## 2018-04-13 DIAGNOSIS — I700 Atherosclerosis of aorta: Secondary | ICD-10-CM | POA: Diagnosis not present

## 2018-04-13 DIAGNOSIS — J45901 Unspecified asthma with (acute) exacerbation: Secondary | ICD-10-CM | POA: Diagnosis not present

## 2018-04-13 DIAGNOSIS — R0789 Other chest pain: Secondary | ICD-10-CM | POA: Insufficient documentation

## 2018-04-13 DIAGNOSIS — Z23 Encounter for immunization: Secondary | ICD-10-CM | POA: Diagnosis not present

## 2018-04-13 DIAGNOSIS — N2581 Secondary hyperparathyroidism of renal origin: Secondary | ICD-10-CM | POA: Diagnosis not present

## 2018-04-13 DIAGNOSIS — G2581 Restless legs syndrome: Secondary | ICD-10-CM | POA: Insufficient documentation

## 2018-04-13 DIAGNOSIS — I132 Hypertensive heart and chronic kidney disease with heart failure and with stage 5 chronic kidney disease, or end stage renal disease: Secondary | ICD-10-CM | POA: Insufficient documentation

## 2018-04-13 DIAGNOSIS — E785 Hyperlipidemia, unspecified: Secondary | ICD-10-CM | POA: Insufficient documentation

## 2018-04-13 DIAGNOSIS — D509 Iron deficiency anemia, unspecified: Secondary | ICD-10-CM | POA: Diagnosis not present

## 2018-04-13 DIAGNOSIS — I160 Hypertensive urgency: Secondary | ICD-10-CM | POA: Diagnosis not present

## 2018-04-13 DIAGNOSIS — R0902 Hypoxemia: Secondary | ICD-10-CM | POA: Diagnosis not present

## 2018-04-13 DIAGNOSIS — Z992 Dependence on renal dialysis: Secondary | ICD-10-CM | POA: Diagnosis not present

## 2018-04-13 DIAGNOSIS — D631 Anemia in chronic kidney disease: Secondary | ICD-10-CM | POA: Diagnosis not present

## 2018-04-13 DIAGNOSIS — R0989 Other specified symptoms and signs involving the circulatory and respiratory systems: Secondary | ICD-10-CM | POA: Diagnosis not present

## 2018-04-13 DIAGNOSIS — I120 Hypertensive chronic kidney disease with stage 5 chronic kidney disease or end stage renal disease: Secondary | ICD-10-CM | POA: Diagnosis not present

## 2018-04-13 HISTORY — DX: Chest pain, unspecified: R07.9

## 2018-04-13 HISTORY — DX: Chronic diastolic (congestive) heart failure: I50.32

## 2018-04-13 HISTORY — DX: Acute on chronic diastolic (congestive) heart failure: I50.33

## 2018-04-13 LAB — CBC
HCT: 28.3 % — ABNORMAL LOW (ref 39.0–52.0)
Hemoglobin: 9.2 g/dL — ABNORMAL LOW (ref 13.0–17.0)
MCH: 36.2 pg — AB (ref 26.0–34.0)
MCHC: 32.5 g/dL (ref 30.0–36.0)
MCV: 111.4 fL — ABNORMAL HIGH (ref 78.0–100.0)
Platelets: 126 10*3/uL — ABNORMAL LOW (ref 150–400)
RBC: 2.54 MIL/uL — ABNORMAL LOW (ref 4.22–5.81)
RDW: 17.9 % — AB (ref 11.5–15.5)
WBC: 4.3 10*3/uL (ref 4.0–10.5)

## 2018-04-13 LAB — BASIC METABOLIC PANEL
Anion gap: 14 (ref 5–15)
BUN: 24 mg/dL — AB (ref 6–20)
CALCIUM: 8.7 mg/dL — AB (ref 8.9–10.3)
CO2: 27 mmol/L (ref 22–32)
Chloride: 94 mmol/L — ABNORMAL LOW (ref 98–111)
Creatinine, Ser: 4.71 mg/dL — ABNORMAL HIGH (ref 0.61–1.24)
GFR calc Af Amer: 14 mL/min — ABNORMAL LOW (ref >60)
GFR calc non Af Amer: 12 mL/min — ABNORMAL LOW (ref >60)
GLUCOSE: 107 mg/dL — AB (ref 70–99)
Potassium: 4 mmol/L (ref 3.5–5.1)
Sodium: 135 mmol/L (ref 135–145)

## 2018-04-13 LAB — I-STAT TROPONIN, ED: Troponin i, poc: 0.05 ng/mL (ref 0.00–0.08)

## 2018-04-13 MED ORDER — CLONAZEPAM 0.5 MG PO TABS
0.5000 mg | ORAL_TABLET | Freq: Every day | ORAL | Status: DC
  Administered 2018-04-14 (×2): 0.5 mg via ORAL
  Filled 2018-04-13 (×2): qty 1

## 2018-04-13 MED ORDER — FENTANYL CITRATE (PF) 100 MCG/2ML IJ SOLN: 50.0000 ug | INTRAMUSCULAR | Status: DC | PRN

## 2018-04-13 MED ORDER — METOPROLOL TARTRATE 100 MG PO TABS
100.0000 mg | ORAL_TABLET | Freq: Two times a day (BID) | ORAL | Status: DC
  Administered 2018-04-14 (×2): 100 mg via ORAL
  Filled 2018-04-13: qty 1
  Filled 2018-04-13: qty 2
  Filled 2018-04-13: qty 1

## 2018-04-13 MED ORDER — CINACALCET HCL 30 MG PO TABS
30.0000 mg | ORAL_TABLET | Freq: Every day | ORAL | Status: DC
  Administered 2018-04-14: 30 mg via ORAL
  Filled 2018-04-13: qty 1

## 2018-04-13 MED ORDER — ASPIRIN EC 81 MG PO TBEC
81.0000 mg | DELAYED_RELEASE_TABLET | Freq: Every day | ORAL | Status: DC
  Administered 2018-04-14 – 2018-04-15 (×2): 81 mg via ORAL
  Filled 2018-04-13 (×2): qty 1

## 2018-04-13 MED ORDER — RENA-VITE PO TABS
1.0000 | ORAL_TABLET | Freq: Every day | ORAL | Status: DC
  Administered 2018-04-14: 1 via ORAL
  Filled 2018-04-13: qty 1

## 2018-04-13 MED ORDER — HEPARIN SODIUM (PORCINE) 5000 UNIT/ML IJ SOLN
5000.0000 [IU] | Freq: Three times a day (TID) | INTRAMUSCULAR | Status: DC
  Administered 2018-04-14 – 2018-04-15 (×6): 5000 [IU] via SUBCUTANEOUS
  Filled 2018-04-13 (×6): qty 1

## 2018-04-13 MED ORDER — POLYETHYLENE GLYCOL 3350 17 G PO PACK
17.0000 g | PACK | Freq: Every day | ORAL | Status: DC | PRN
Start: 2018-04-13 — End: 2018-04-15

## 2018-04-13 MED ORDER — ONDANSETRON HCL 4 MG/2ML IJ SOLN: 4.0000 mg | Freq: Four times a day (QID) | INTRAMUSCULAR | Status: DC | PRN

## 2018-04-13 MED ORDER — SEVELAMER CARBONATE 800 MG PO TABS
1600.0000 mg | ORAL_TABLET | Freq: Three times a day (TID) | ORAL | Status: DC
Start: 2018-04-14 — End: 2018-04-15
  Administered 2018-04-14 – 2018-04-15 (×5): 1600 mg via ORAL
  Filled 2018-04-13 (×5): qty 2

## 2018-04-13 MED ORDER — PANTOPRAZOLE SODIUM 40 MG PO TBEC
40.0000 mg | DELAYED_RELEASE_TABLET | Freq: Every day | ORAL | Status: DC
  Administered 2018-04-14 – 2018-04-15 (×2): 40 mg via ORAL
  Filled 2018-04-13 (×2): qty 1

## 2018-04-13 MED ORDER — NITROGLYCERIN 0.4 MG SL SUBL: 0.4000 mg | SUBLINGUAL_TABLET | SUBLINGUAL | Status: DC | PRN

## 2018-04-13 MED ORDER — ALBUTEROL SULFATE (2.5 MG/3ML) 0.083% IN NEBU: 2.5000 mg | INHALATION_SOLUTION | Freq: Four times a day (QID) | RESPIRATORY_TRACT | Status: DC | PRN

## 2018-04-13 MED ORDER — ACETAMINOPHEN 325 MG PO TABS
650.0000 mg | ORAL_TABLET | ORAL | Status: DC | PRN
  Administered 2018-04-15: 650 mg via ORAL
  Filled 2018-04-13: qty 2

## 2018-04-13 MED ORDER — HYDRALAZINE HCL 50 MG PO TABS
100.0000 mg | ORAL_TABLET | Freq: Two times a day (BID) | ORAL | Status: DC
  Administered 2018-04-14: 100 mg via ORAL
  Filled 2018-04-13 (×3): qty 2

## 2018-04-13 MED ORDER — ROPINIROLE HCL 1 MG PO TABS
0.5000 mg | ORAL_TABLET | Freq: Two times a day (BID) | ORAL | Status: DC
  Administered 2018-04-14 – 2018-04-15 (×4): 0.5 mg via ORAL
  Filled 2018-04-13 (×5): qty 1

## 2018-04-13 MED ORDER — FENTANYL CITRATE (PF) 100 MCG/2ML IJ SOLN
12.5000 ug | INTRAMUSCULAR | Status: DC | PRN
  Administered 2018-04-14 (×5): 25 ug via INTRAVENOUS
  Administered 2018-04-14: 12.5 ug via INTRAVENOUS
  Administered 2018-04-15: 25 ug via INTRAVENOUS
  Filled 2018-04-13 (×7): qty 2

## 2018-04-13 MED ORDER — METOCLOPRAMIDE HCL 10 MG PO TABS: 5.0000 mg | ORAL_TABLET | Freq: Three times a day (TID) | ORAL | Status: DC | PRN

## 2018-04-13 MED ORDER — HYDROMORPHONE HCL 1 MG/ML IJ SOLN
1.0000 mg | Freq: Once | INTRAMUSCULAR | Status: AC
  Administered 2018-04-13: 1 mg via INTRAVENOUS
  Filled 2018-04-13: qty 1

## 2018-04-13 MED ORDER — GABAPENTIN 100 MG PO CAPS
100.0000 mg | ORAL_CAPSULE | Freq: Every day | ORAL | Status: DC
  Administered 2018-04-14 (×2): 100 mg via ORAL
  Filled 2018-04-13 (×2): qty 1

## 2018-04-13 MED ORDER — HYDRALAZINE HCL 20 MG/ML IJ SOLN
10.0000 mg | INTRAMUSCULAR | Status: DC | PRN
  Administered 2018-04-14 (×2): 10 mg via INTRAVENOUS
  Filled 2018-04-13 (×3): qty 1

## 2018-04-13 NOTE — H&P (Signed)
History and Physical    Jance Siek RWE:[id number] DOB: 1961-01-24 DOA: 04/13/2018  PCP: Charlott Rakes, MD   Patient coming from: Home   Chief Complaint: Chest pain  HPI: Thomas Robinson is a 57 y.o. male with medical history significant for end-stage renal disease on hemodialysis, chronic diastolic CHF, chronic anemia, and hypertension, now presenting to the emergency department for evaluation of chest pain.  Patient has history of intermittent chest pain, was admitted for this recently but left AMA.  He saw cardiology in the clinic a few days ago and there were plans for coronary CT that has not yet been performed.  Patient fell in the shower 2 nights ago, has had some left lateral chest wall pain since then, but then also developed a pressure sensation in the central chest today that has persisted.  He reports slight improvement with hemodialysis, which he reports completing today, but pressure worsened again later in the evening.  He reports a chronic cough and mild chronic dyspnea.  He has orthopnea recently and mild bilateral lower extremity swelling, but no tenderness.  Denies fevers or chills.  He called EMS tonight, was treated with sublingual nitroglycerin x2 with slight improvement, and was also given a full dose aspirin.  ED Course: Upon arrival to the ED, patient is found to be afebrile, saturating low 90s on room air, hypertensive to 206/161, and vitals otherwise stable.  EKG features a sinus rhythm with PVCs and LVH with secondary repolarization abnormality.  Chemistry panel features a normal potassium, normal bicarbonate, and normal BUN.  CBC is notable for a macrocytic anemia with hemoglobin 9.2 and MCV of 111.4.  Troponin is within the normal limits.  Patient was treated with Dilaudid in the ED.  He remains hemodynamically stable with improved blood pressure following the analgesic, and is not in any respiratory distress, and will be observed for ongoing evaluation and  management of chest pressure with hypertensive emergency and pulmonary edema despite reporting that he completed dialysis today.  Review of Systems:  All other systems reviewed and apart from HPI, are negative.  Past Medical History:  Diagnosis Date  . Acute kidney injury superimposed on CKD ([place]) 05/24/2017  . Acute on chronic diastolic CHF (congestive heart failure) ([place]) 04/13/2018  . AKI (acute kidney injury) ([place]) 04/10/2015  . Asthma   . Asthma, chronic, unspecified asthma severity, with acute exacerbation 10/23/2017  . Atypical chest pain 02/16/2018  . CAP (community acquired pneumonia) 10/23/2017  . Cerebral infarction due to thrombosis of cerebral artery ([place])   . Cerebrovascular accident (CVA) ([place])   . Chronic kidney disease   . CKD (chronic kidney disease), stage V ([place]) 05/24/2017  . ESRD (end stage renal disease) on dialysis ([place]) 02/16/2018  . Essential hypertension 04/09/2015  . GI bleeding 05/24/2017  . History of completed stroke   . Hyperlipidemia   . Hypertension   . Hypertensive urgency 05/24/2017  . Kidney failure   . Respiratory failure, acute ([place]) 10/23/2017  . Restless leg syndrome   . Stroke ([place])   . Symptomatic anemia 05/24/2017    Past Surgical History:  Procedure Laterality Date  . AV FISTULA PLACEMENT Left 05/28/2017   Procedure: LEFT ARM ARTERIOVENOUS (AV) FISTULA CREATION;  Surgeon: Conrad Lawler, MD;  Location: [facility];  Service: Vascular;  Laterality: Left;  . IR FLUORO GUIDE CV LINE RIGHT  05/25/2017  . IR US GUIDE VASC ACCESS RIGHT  05/25/2017  . RINOPLASTY       reports that  he has never smoked. He has never used smokeless tobacco. He reports that he does not drink alcohol or use drugs.  No Known Allergies  Family History  Problem Relation Age of Onset  . Cancer Mother      Prior to Admission medications   Medication Sig Start Date End Date Taking? Authorizing Provider  acetaminophen (TYLENOL) 500 MG tablet Take 1 tablet (500 mg total) by mouth  every 4 (four) hours as needed for mild pain or headache. Reported on 12/17/2015 10/24/17  Yes Park, Derenda Mis, MD  albuterol (PROVENTIL HFA;VENTOLIN HFA) 108 (90 BASE) MCG/ACT inhaler Inhale 1-2 puffs into the lungs every 6 (six) hours as needed for wheezing or shortness of breath. 04/12/15  Yes [name], MD  cinacalcet (SENSIPAR) 30 MG tablet Take 30 mg by mouth daily with supper. Do not take less than 12 hours prior to dialysis 01/25/18  Yes [provider]  clonazePAM (KLONOPIN) 0.5 MG tablet Take 0.5 mg by mouth at bedtime.    Yes [provider]  gabapentin (NEURONTIN) 100 MG capsule Take 100 mg by mouth at bedtime.    Yes [provider]  hydrALAZINE (APRESOLINE) 100 MG tablet Take 1 tablet (100 mg total) by mouth 2 (two) times daily. 11/04/17  Yes Ena Dawley, Tiffany S, PA-C  metoCLOPramide (REGLAN) 5 MG tablet Take 5 mg by mouth every 8 (eight) hours as needed for nausea.  03/29/18  Yes [provider]  metoprolol tartrate (LOPRESSOR) 100 MG tablet Take 1 tablet (100 mg total) by mouth 2 (two) times daily. 11/04/17  Yes Ena Dawley, Tiffany S, PA-C  multivitamin (RENA-VIT) TABS tablet Take 1 tablet by mouth daily. 01/13/18  Yes [provider]  pantoprazole (PROTONIX) 40 MG tablet Take 40 mg by mouth daily. 03/29/18  Yes [provider]  polyethylene glycol (MIRALAX / GLYCOLAX) packet Take 17 g by mouth daily as needed. Patient taking differently: Take 17 g by mouth daily as needed for mild constipation.  06/02/17  Yes [name], MD  rOPINIRole (REQUIP) 0.5 MG tablet Take 0.5 mg by mouth 2 (two) times daily. 01/26/18  Yes [provider]  sevelamer (RENAGEL) 800 MG tablet Take 800-1,600 mg by mouth See admin instructions. 1,600mg  by mouth three times daily and 800mg  twice daily with snacks 01/25/18  Yes [provider]    Physical Exam: Vitals:   04/13/18 1941 04/13/18 1943 04/13/18 1951 04/13/18 2145  BP: (!) 206/161 (!)  192/172  (!) 160/62  Pulse: 66   (!) 59  Resp: 17   16  Temp: 98 F (36.7 C)     TempSrc: Oral     SpO2: 97%  97% 91%  Weight: 61.2 kg     Height: 5\' 8"  (1.727 m)         Constitutional: NAD, calm  Eyes: PERTLA, lids and conjunctivae normal ENMT: Mucous membranes are moist. Posterior pharynx clear of any exudate or lesions.   Neck: normal, supple, no masses, no thyromegaly Respiratory: Fine rales bilaterally, no wheezing. No accessory muscle use.  Cardiovascular: S1 & S2 heard, regular rate and rhythm. 1+ pretibial edema bilaterally. Abdomen: No distension, no tenderness, soft. Bowel sounds active.  Musculoskeletal: no clubbing / cyanosis. No joint deformity upper and lower extremities.    Skin: no significant rashes, lesions, ulcers. Warm, dry, well-perfused. Neurologic: CN 2-12 grossly intact. Sensation intact. Strength 5/5 in all 4 limbs.  Psychiatric: Alert and oriented x 3. Calm, cooperative.     Labs on  Admission: I have personally reviewed following labs and imaging studies  CBC: Recent Labs  Lab 04/13/18 2045  WBC 4.3  HGB 9.2*  HCT 28.3*  MCV 111.4*  PLT 007*   Basic Metabolic Panel: Recent Labs  Lab 04/13/18 2045  NA 135  K 4.0  CL 94*  CO2 27  GLUCOSE 107*  BUN 24*  CREATININE 4.71*  CALCIUM 8.7*   GFR: Estimated Creatinine Clearance: 14.8 mL/min (A) (by C-G formula based on SCr of 4.71 mg/dL (H)). Liver Function Tests: No results for input(s): AST, ALT, ALKPHOS, BILITOT, PROT, ALBUMIN in the last 168 hours. No results for input(s): LIPASE, AMYLASE in the last 168 hours. No results for input(s): AMMONIA in the last 168 hours. Coagulation Profile: No results for input(s): INR, PROTIME in the last 168 hours. Cardiac Enzymes: No results for input(s): CKTOTAL, CKMB, CKMBINDEX, TROPONINI in the last 168 hours. BNP (last 3 results) No results for input(s): PROBNP in the last 8760 hours. HbA1C: No results for input(s): HGBA1C in the last 72  hours. CBG: No results for input(s): GLUCAP in the last 168 hours. Lipid Profile: No results for input(s): CHOL, HDL, LDLCALC, TRIG, CHOLHDL, LDLDIRECT in the last 72 hours. Thyroid Function Tests: No results for input(s): TSH, T4TOTAL, FREET4, T3FREE, THYROIDAB in the last 72 hours. Anemia Panel: No results for input(s): VITAMINB12, FOLATE, FERRITIN, TIBC, IRON, RETICCTPCT in the last 72 hours. Urine analysis:    Component Value Date/Time   COLORURINE YELLOW 05/24/2017 1729   APPEARANCEUR HAZY (A) 05/24/2017 1729   LABSPEC 1.011 05/24/2017 1729   PHURINE 5.0 05/24/2017 1729   GLUCOSEU 50 (A) 05/24/2017 1729   HGBUR SMALL (A) 05/24/2017 1729   BILIRUBINUR NEGATIVE 05/24/2017 1729   KETONESUR NEGATIVE 05/24/2017 1729   PROTEINUR 100 (A) 05/24/2017 1729   UROBILINOGEN 0.2 04/09/2015 1527   NITRITE NEGATIVE 05/24/2017 1729   LEUKOCYTESUR TRACE (A) 05/24/2017 1729   Sepsis Labs: @LABRCNTIP (procalcitonin:4,lacticidven:4) )No results found for this or any previous visit (from the past 240 hour(s)).   Radiological Exams on Admission: Dg Chest 2 View  Result Date: 04/13/2018 CLINICAL DATA:  Initial evaluation for acute left rib pain status post recent fall 3 days ago. EXAM: CHEST - 2 VIEW COMPARISON:  Prior radiograph from 02/16/2018. FINDINGS: Moderate cardiomegaly, stable. Mediastinal silhouette within normal limits. Aortic atherosclerosis. Lungs normally inflated. Prominent vascular and interstitial congestion suggestive of mild pulmonary interstitial congestion/edema. No definite pleural effusion, although no focal infiltrates. No pneumothorax. No acute osseous abnormality. No appreciable displaced rib fracture identified. Incompletely visualized. IMPRESSION: 1. Cardiomegaly with mild diffuse pulmonary interstitial edema. 2. Aortic atherosclerosis. Electronically Signed   By: Jeannine Boga M.D.   On: 04/13/2018 21:27    EKG: Independently reviewed. Sinus rhythm, PVC's, LVH with  repolarization abnormality.   Assessment/Plan   1. Chest pain  - Presents with one day of central chest pressure, slightly improved with NTG prior to arrival in ED  - He does not have known CAD, but has had recurrent chest pain for which he saw cardiology in clinic a few days with plan for coronary CT with FFR, not yet performed   - He was treated with full-dose ASA with EMS prior to arrival in ED  - Initial troponin is negative, EKG with non-specific changes likely related to LVH, and CXR with pulm edema  - Continue cardiac monitoring, obtain serial troponin measurements, continue beta-blocker, repeat EKG in am    2. Hypertension with hypertensive urgency  - SBP in low 200's  in ED, improved some with analgesia  - Continue hydralazine and Lopressor    3. ESRD  - Reports completing HD on 8/13 prior to arrival in ED  - There is no hyperkalemia, uremia, or acidosis, but he has volume overload without respiratory distress and hypertensive urgency  - SLIV, renally-dose medications, follow I/O and daily wts, may need HD again prior to Tue    4. Macrocytic anemia  - Hgb appears to be stable at 9.2 but macrocytosis is new with MCV now 111.4  - Check B12 and folate    5. Acute on chronic diastolic CHF  - Diffuse edema on CXR and mild peripheral edema noted  - He reports orthopnea, but not in any distress or hypoxic at rest in ED  - SLIV, may need HD again before Tues    DVT prophylaxis: sq heparin  Code Status: Full  Family Communication: Discussed with patient  Consults called: None Admission status: Observation     Vianne Bulls, MD [facility] Pager [phone]  If 7PM-7AM, please contact night-coverage [email or web address] Password [email or web address]  04/13/2018, 11:11 PM

## 2018-04-13 NOTE — ED Notes (Signed)
Incentive meter at bedside, teaching completed and printed handout given

## 2018-04-13 NOTE — ED Notes (Signed)
Admitting at bedside 

## 2018-04-13 NOTE — ED Triage Notes (Signed)
Pt arrived via [facility] from home c/o central chest pain all day.  Pt was able to get complete treatment at dialysis today.  Reports a productive cough for several weeks.  EMS gave 2 SL nitro, 324mg  ASA.

## 2018-04-13 NOTE — ED Provider Notes (Signed)
[facility] EMERGENCY DEPARTMENT Provider Note   CSN: [id number] Arrival date & time: 04/13/18  1938     History   Chief Complaint Chief Complaint  Patient presents with  . Chest Pain    HPI Thomas Robinson is a 57 y.o. male.  Patient with history of high blood pressure, end-stage renal disease on dialysis dialyzed earlier today for approximate 1 hour presents with left-sided chest pain worsening this evening.  Patient states that he fell and injured his left rib on Monday and is worse with movement.` Patient did not have pain from his fall from the rib and after dialysis today developed anterior chest pressure and feels like fluid building up in his lungs.  Patient normally sleeps with 2 pillows.  No fever.  Mild cough.     Past Medical History:  Diagnosis Date  . Acute kidney injury superimposed on CKD ([place]) 05/24/2017  . AKI (acute kidney injury) ([place]) 04/10/2015  . Asthma   . Asthma, chronic, unspecified asthma severity, with acute exacerbation 10/23/2017  . Atypical chest pain 02/16/2018  . CAP (community acquired pneumonia) 10/23/2017  . Cerebral infarction due to thrombosis of cerebral artery ([place])   . Cerebrovascular accident (CVA) ([place])   . Chronic kidney disease   . CKD (chronic kidney disease), stage V ([place]) 05/24/2017  . ESRD (end stage renal disease) on dialysis ([place]) 02/16/2018  . Essential hypertension 04/09/2015  . GI bleeding 05/24/2017  . History of completed stroke   . Hyperlipidemia   . Hypertension   . Hypertensive urgency 05/24/2017  . Kidney failure   . Respiratory failure, acute ([place]) 10/23/2017  . Restless leg syndrome   . Stroke ([place])   . Symptomatic anemia 05/24/2017    Patient Active Problem List   Diagnosis Date Noted  . Atypical chest pain 02/16/2018  . ESRD (end stage renal disease) on dialysis ([place]) 02/16/2018  . Volume overload 10/23/2017  . Cough 08/23/2017  . Constipation   . Macrocytic anemia 05/24/2017  . Hypertensive  urgency 05/24/2017  . History of completed stroke   . Essential hypertension 04/09/2015    Past Surgical History:  Procedure Laterality Date  . AV FISTULA PLACEMENT Left 05/28/2017   Procedure: LEFT ARM ARTERIOVENOUS (AV) FISTULA CREATION;  Surgeon: Conrad Glasco, MD;  Location: [facility];  Service: Vascular;  Laterality: Left;  . IR FLUORO GUIDE CV LINE RIGHT  05/25/2017  . IR US GUIDE VASC ACCESS RIGHT  05/25/2017  . RINOPLASTY          Home Medications    Prior to Admission medications   Medication Sig Start Date End Date Taking? Authorizing Provider  acetaminophen (TYLENOL) 500 MG tablet Take 1 tablet (500 mg total) by mouth every 4 (four) hours as needed for mild pain or headache. Reported on 12/17/2015 10/24/17  Yes Park, Derenda Mis, MD  albuterol (PROVENTIL HFA;VENTOLIN HFA) 108 (90 BASE) MCG/ACT inhaler Inhale 1-2 puffs into the lungs every 6 (six) hours as needed for wheezing or shortness of breath. 04/12/15  Yes [name], MD  cinacalcet (SENSIPAR) 30 MG tablet Take 30 mg by mouth daily with supper. Do not take less than 12 hours prior to dialysis 01/25/18  Yes [provider]  clonazePAM (KLONOPIN) 0.5 MG tablet Take 0.5 mg by mouth at bedtime.    Yes [provider]  gabapentin (NEURONTIN) 100 MG capsule Take 100 mg by mouth at bedtime.    Yes [provider]  hydrALAZINE (APRESOLINE) 100  MG tablet Take 1 tablet (100 mg total) by mouth 2 (two) times daily. 11/04/17  Yes Ena Dawley, Tiffany S, PA-C  metoCLOPramide (REGLAN) 5 MG tablet Take 5 mg by mouth every 8 (eight) hours as needed for nausea.  03/29/18  Yes [provider]  metoprolol tartrate (LOPRESSOR) 100 MG tablet Take 1 tablet (100 mg total) by mouth 2 (two) times daily. 11/04/17  Yes Ena Dawley, Tiffany S, PA-C  multivitamin (RENA-VIT) TABS tablet Take 1 tablet by mouth daily. 01/13/18  Yes [provider]  pantoprazole (PROTONIX) 40 MG tablet Take 40 mg by mouth daily. 03/29/18  Yes  [provider]  polyethylene glycol (MIRALAX / GLYCOLAX) packet Take 17 g by mouth daily as needed. Patient taking differently: Take 17 g by mouth daily as needed for mild constipation.  06/02/17  Yes [name], MD  rOPINIRole (REQUIP) 0.5 MG tablet Take 0.5 mg by mouth 2 (two) times daily. 01/26/18  Yes [provider]  sevelamer (RENAGEL) 800 MG tablet Take 800-1,600 mg by mouth See admin instructions. 1,600mg  by mouth three times daily and 800mg  twice daily with snacks 01/25/18  Yes [provider]    Family History Family History  Problem Relation Age of Onset  . Cancer Mother     Social History Social History   Tobacco Use  . Smoking status: Never Smoker  . Smokeless tobacco: Never Used  Substance Use Topics  . Alcohol use: No    Alcohol/week: 0.0 standard drinks    Comment: fORMER DRINKER  . Drug use: No     Allergies   Patient has no known allergies.   Review of Systems Review of Systems  Constitutional: Negative for chills and fever.  HENT: Negative for congestion.   Eyes: Negative for visual disturbance.  Respiratory: Positive for cough and shortness of breath.   Cardiovascular: Positive for chest pain. Negative for leg swelling.  Gastrointestinal: Negative for abdominal pain and vomiting.  Genitourinary: Negative for dysuria and flank pain.  Musculoskeletal: Negative for back pain, neck pain and neck stiffness.  Skin: Negative for rash.  Neurological: Negative for light-headedness and headaches.     Physical Exam Updated Vital Signs BP (!) 160/62   Pulse (!) 59   Temp 98 F (36.7 C) (Oral)   Resp 16   Ht 5\' 8"  (1.727 m)   Wt 61.2 kg   SpO2 91%   BMI 20.53 kg/m   Physical Exam  Constitutional: He is oriented to person, place, and time. He appears well-developed and well-nourished.  HENT:  Head: Normocephalic and atraumatic.  Eyes: Conjunctivae are normal. Right eye exhibits no discharge. Left eye exhibits no  discharge.  Neck: Normal range of motion. Neck supple. No tracheal deviation present.  Cardiovascular: Normal rate and regular rhythm.  Pulmonary/Chest: Effort normal. He has rales.  Abdominal: Soft. He exhibits no distension. There is no tenderness. There is no guarding.  Musculoskeletal: He exhibits no edema.  Neurological: He is alert and oriented to person, place, and time.  Skin: Skin is warm. No rash noted.  Psychiatric: He has a normal mood and affect.  Nursing note and vitals reviewed.    ED Treatments / Results  Labs (all labs ordered are listed, but only abnormal results are displayed) Labs Reviewed  BASIC METABOLIC PANEL - Abnormal; Notable for the following components:      Result Value   Chloride 94 (*)    Glucose, Bld 107 (*)    BUN 24 (*)  Creatinine, Ser 4.71 (*)    Calcium 8.7 (*)    GFR calc non Af Amer 12 (*)    GFR calc Af Amer 14 (*)    All other components within normal limits  CBC - Abnormal; Notable for the following components:   RBC 2.54 (*)    Hemoglobin 9.2 (*)    HCT 28.3 (*)    MCV 111.4 (*)    MCH 36.2 (*)    RDW 17.9 (*)    Platelets 126 (*)    All other components within normal limits  I-STAT TROPONIN, ED    EKG EKG Interpretation  Date/Time:  Tuesday April 13 2018 20:41:10 EDT Ventricular Rate:  69 PR Interval:    QRS Duration: 89 QT Interval:  460 QTC Calculation: 493 R Axis:   73 Text Interpretation:  Sinus rhythm Multiple premature complexes, vent & supraven Biatrial enlargement LVH with secondary repolarization abnormality Anterior Q waves, possibly due to LVH Confirmed by Elnora Morrison [phone]) on 04/13/2018 9:55:58 PM   Radiology Dg Chest 2 View  Result Date: 04/13/2018 CLINICAL DATA:  Initial evaluation for acute left rib pain status post recent fall 3 days ago. EXAM: CHEST - 2 VIEW COMPARISON:  Prior radiograph from 02/16/2018. FINDINGS: Moderate cardiomegaly, stable. Mediastinal silhouette within normal limits. Aortic  atherosclerosis. Lungs normally inflated. Prominent vascular and interstitial congestion suggestive of mild pulmonary interstitial congestion/edema. No definite pleural effusion, although no focal infiltrates. No pneumothorax. No acute osseous abnormality. No appreciable displaced rib fracture identified. Incompletely visualized. IMPRESSION: 1. Cardiomegaly with mild diffuse pulmonary interstitial edema. 2. Aortic atherosclerosis. Electronically Signed   By: Jeannine Boga M.D.   On: 04/13/2018 21:27    Procedures Procedures (including critical care time)  Medications Ordered in ED Medications  HYDROmorphone (DILAUDID) injection 1 mg (1 mg Intravenous Given 04/13/18 2043)     Initial Impression / Assessment and Plan / ED Course  I have reviewed the triage vital signs and the nursing notes.  Pertinent labs & imaging results that were available during my care of the patient were reviewed by me and considered in my medical decision making (see chart for details).    Dialysis patient presents with worsening chest pressure and shortness of breath.  Patient clinically has 2 separate chest pain components in the left lateral rib from a fall and then today developed chest pressure and shortness of breath.  Patient not requiring oxygen, blood work reviewed potassium normal, kidney function elevated as expected.  Discussed with patient still feeling mild chest pressure.  Discussed with hospitalist for assessment in the ER. Blood work revealed neg trop, no acute ekg changes, chronic anemia/ CKD.     Final Clinical Impressions(s) / ED Diagnoses   Final diagnoses:  ESRD on dialysis [facility])  Chest pressure    ED Discharge Orders    None       Elnora Morrison, MD 04/19/18 1039

## 2018-04-14 ENCOUNTER — Other Ambulatory Visit: Payer: Self-pay

## 2018-04-14 DIAGNOSIS — G2581 Restless legs syndrome: Secondary | ICD-10-CM | POA: Diagnosis not present

## 2018-04-14 DIAGNOSIS — N186 End stage renal disease: Secondary | ICD-10-CM | POA: Diagnosis not present

## 2018-04-14 DIAGNOSIS — J45901 Unspecified asthma with (acute) exacerbation: Secondary | ICD-10-CM

## 2018-04-14 DIAGNOSIS — I700 Atherosclerosis of aorta: Secondary | ICD-10-CM | POA: Diagnosis not present

## 2018-04-14 DIAGNOSIS — D539 Nutritional anemia, unspecified: Secondary | ICD-10-CM | POA: Diagnosis not present

## 2018-04-14 DIAGNOSIS — Z992 Dependence on renal dialysis: Secondary | ICD-10-CM | POA: Diagnosis not present

## 2018-04-14 DIAGNOSIS — I160 Hypertensive urgency: Secondary | ICD-10-CM

## 2018-04-14 DIAGNOSIS — Z8673 Personal history of transient ischemic attack (TIA), and cerebral infarction without residual deficits: Secondary | ICD-10-CM | POA: Diagnosis not present

## 2018-04-14 DIAGNOSIS — I5033 Acute on chronic diastolic (congestive) heart failure: Secondary | ICD-10-CM | POA: Diagnosis not present

## 2018-04-14 DIAGNOSIS — N179 Acute kidney failure, unspecified: Secondary | ICD-10-CM | POA: Diagnosis not present

## 2018-04-14 DIAGNOSIS — E785 Hyperlipidemia, unspecified: Secondary | ICD-10-CM | POA: Diagnosis not present

## 2018-04-14 DIAGNOSIS — I132 Hypertensive heart and chronic kidney disease with heart failure and with stage 5 chronic kidney disease, or end stage renal disease: Secondary | ICD-10-CM | POA: Diagnosis not present

## 2018-04-14 DIAGNOSIS — R0789 Other chest pain: Secondary | ICD-10-CM | POA: Diagnosis not present

## 2018-04-14 LAB — TROPONIN I
TROPONIN I: 0.07 ng/mL — AB (ref <0.03)
Troponin I: 0.08 ng/mL (ref <0.03)
Troponin I: 0.06 ng/mL (ref <0.03)

## 2018-04-14 LAB — FOLATE: Folate: 43 ng/mL (ref >5.9)

## 2018-04-14 LAB — VITAMIN B12: VITAMIN B 12: 849 pg/mL (ref 180–914)

## 2018-04-14 LAB — MRSA PCR SCREENING: MRSA by PCR: POSITIVE — AB

## 2018-04-14 MED ORDER — NITROGLYCERIN 2 % TD OINT
1.0000 [in_us] | TOPICAL_OINTMENT | Freq: Three times a day (TID) | TRANSDERMAL | Status: DC
  Administered 2018-04-14 – 2018-04-15 (×3): 1 [in_us] via TOPICAL
  Filled 2018-04-14: qty 30

## 2018-04-14 MED ORDER — MUPIROCIN 2 % EX OINT
1.0000 "application " | TOPICAL_OINTMENT | Freq: Two times a day (BID) | CUTANEOUS | Status: DC
  Administered 2018-04-14 – 2018-04-15 (×3): 1 via NASAL
  Filled 2018-04-14 (×2): qty 22

## 2018-04-14 MED ORDER — MONTELUKAST SODIUM 10 MG PO TABS
10.0000 mg | ORAL_TABLET | Freq: Every day | ORAL | Status: DC
  Administered 2018-04-14: 10 mg via ORAL
  Filled 2018-04-14: qty 1

## 2018-04-14 MED ORDER — CARVEDILOL 12.5 MG PO TABS
12.5000 mg | ORAL_TABLET | Freq: Two times a day (BID) | ORAL | Status: DC
  Administered 2018-04-14 – 2018-04-15 (×2): 12.5 mg via ORAL
  Filled 2018-04-14 (×2): qty 1

## 2018-04-14 MED ORDER — METHYLPREDNISOLONE SODIUM SUCC 40 MG IJ SOLR
40.0000 mg | Freq: Three times a day (TID) | INTRAMUSCULAR | Status: DC
  Administered 2018-04-14 – 2018-04-15 (×4): 40 mg via INTRAVENOUS
  Filled 2018-04-14 (×4): qty 1

## 2018-04-14 MED ORDER — IPRATROPIUM-ALBUTEROL 0.5-2.5 (3) MG/3ML IN SOLN: 3.0000 mL | Freq: Four times a day (QID) | RESPIRATORY_TRACT | Status: DC | PRN

## 2018-04-14 MED ORDER — HYDRALAZINE HCL 50 MG PO TABS
100.0000 mg | ORAL_TABLET | Freq: Three times a day (TID) | ORAL | Status: DC
  Administered 2018-04-14 – 2018-04-15 (×3): 100 mg via ORAL
  Filled 2018-04-14 (×4): qty 2

## 2018-04-14 MED ORDER — SEVELAMER CARBONATE 800 MG PO TABS: 800.0000 mg | ORAL_TABLET | Freq: Two times a day (BID) | ORAL | Status: DC | PRN

## 2018-04-14 MED ORDER — BUDESONIDE 0.25 MG/2ML IN SUSP
0.2500 mg | Freq: Two times a day (BID) | RESPIRATORY_TRACT | Status: DC
  Administered 2018-04-14: 0.25 mg via RESPIRATORY_TRACT
  Filled 2018-04-14 (×2): qty 2

## 2018-04-14 MED ORDER — CARVEDILOL 25 MG PO TABS: 25.0000 mg | ORAL_TABLET | Freq: Two times a day (BID) | ORAL | Status: DC

## 2018-04-14 MED ORDER — CHLORHEXIDINE GLUCONATE CLOTH 2 % EX PADS
6.0000 | MEDICATED_PAD | Freq: Every day | CUTANEOUS | Status: DC
  Administered 2018-04-15: 6 via TOPICAL

## 2018-04-14 MED ORDER — AMLODIPINE BESYLATE 10 MG PO TABS
10.0000 mg | ORAL_TABLET | Freq: Every day | ORAL | Status: DC
  Administered 2018-04-14 – 2018-04-15 (×2): 10 mg via ORAL
  Filled 2018-04-14 (×2): qty 1

## 2018-04-14 MED ORDER — IPRATROPIUM-ALBUTEROL 0.5-2.5 (3) MG/3ML IN SOLN
3.0000 mL | Freq: Four times a day (QID) | RESPIRATORY_TRACT | Status: DC
  Administered 2018-04-14 – 2018-04-15 (×4): 3 mL via RESPIRATORY_TRACT
  Filled 2018-04-14 (×5): qty 3

## 2018-04-14 NOTE — Consult Note (Signed)
Reason for Consult: ESRD Referring Physician:  Dr. Marthenia Rolling  Chief Complaint: Chest pain  Assessment/Plan: 1. ESRD - TTS @ [facility]; no acute indication for HD tonight. Will plan on HD on TTS regimen in the AM. EDW upon d/c was 55kg 10/2017 but he has also been cramping on HD and currently weight is 61kg.  2. CP - demand ischemia? Currently CP free with better BP control, nitro patch. 3. HTN urgency - currently controlled. 4. dCHF 5. Anemia Hb 9.2 -> [facility] ecube is down and unable to check baseline + last dose of ESA (prior Aranesp dose of 244mg) 6. MBD - will check a phos in the AM to help with management of binders.    HPI: Thomas Robinson an 57y.o. male with past medical history significant for ESRD HD TTS @ [facility] w/ Dr. DLorrene Reid chronic dCHF, anemia, and HTN.  Patient presenting w/ chest pain and hypertensive urgency w/ an initial blood pressure of 206/161 mmHg in the ED w/ neg troponins. Pt recently fell with trauma to the left lower ribs but states that the CP is more midsternal pressure like and 9/10 alleviated with nitro tablets given in transit. Currently his BP is much better controlled and he has a nitropatch, CP free at time of exam. Patient has a chronic cough, dyspnea, and orthopnea but denies any f/c/n/v.  Last dialysis treatment was on Tuesday.  ROS Pertinent items are noted in HPI.  Chemistry and CBC: Creatinine, Ser  Date/Time Value Ref Range Status  04/13/2018 08:45 PM 4.71 (H) 0.61 - 1.24 mg/dL Final  02/17/2018 09:13 AM 7.12 (H) 0.61 - 1.24 mg/dL Final  02/16/2018 01:02 PM 5.96 (H) 0.61 - 1.24 mg/dL Final  02/07/2018 12:18 AM 6.92 (H) 0.61 - 1.24 mg/dL Final  10/24/2017 02:02 AM 7.97 (H) 0.61 - 1.24 mg/dL Final  10/23/2017 06:35 PM 7.87 (H) 0.61 - 1.24 mg/dL Final  08/24/2017 06:57 PM 5.82 (H) 0.61 - 1.24 mg/dL Final  08/24/2017 03:57 AM 12.99 (H) 0.61 - 1.24 mg/dL Final  08/23/2017 09:35 PM 13.20 (H) 0.61 - 1.24 mg/dL Final  08/23/2017 09:32 PM 13.10 (H) 0.61 - 1.24 mg/dL  Final  06/01/2017 03:00 PM 8.03 (H) 0.61 - 1.24 mg/dL Final  05/29/2017 03:15 AM 5.74 (H) 0.61 - 1.24 mg/dL Final  05/28/2017 05:45 AM 3.96 (H) 0.61 - 1.24 mg/dL Final    Comment:    RESULT REPEATED AND VERIFIED  05/27/2017 12:23 PM 6.22 (H) 0.61 - 1.24 mg/dL Final    Comment:    DELTA CHECK NOTED  05/26/2017 09:44 AM 9.64 (H) 0.61 - 1.24 mg/dL Final    Comment:    DELTA CHECK NOTED  05/25/2017 03:46 PM 15.74 (H) 0.61 - 1.24 mg/dL Final  05/25/2017 06:43 AM 15.18 (H) 0.61 - 1.24 mg/dL Final  05/24/2017 05:20 PM 15.72 (H) 0.61 - 1.24 mg/dL Final  04/07/2016 03:41 PM 6.59 (HH) 0.40 - 1.50 mg/dL Final  04/12/2015 06:38 AM 4.96 (H) 0.61 - 1.24 mg/dL Final  04/11/2015 05:48 AM 4.90 (H) 0.61 - 1.24 mg/dL Final  04/10/2015 09:50 AM 5.01 (H) 0.61 - 1.24 mg/dL Final  04/09/2015 11:05 PM 4.99 (H) 0.61 - 1.24 mg/dL Final  04/09/2015 01:30 PM 5.20 (H) 0.61 - 1.24 mg/dL Final  04/09/2015 01:20 PM 5.13 (H) 0.61 - 1.24 mg/dL Final  03/23/2013 04:10 PM 1.67 (H) 0.50 - 1.35 mg/dL Final   Recent Labs  Lab 04/13/18 2045  NA 135  K 4.0  CL 94*  CO2 27  GLUCOSE  107*  BUN 24*  CREATININE 4.71*  CALCIUM 8.7*   Recent Labs  Lab 04/13/18 2045  WBC 4.3  HGB 9.2*  HCT 28.3*  MCV 111.4*  PLT 126*   Liver Function Tests: No results for input(s): AST, ALT, ALKPHOS, BILITOT, PROT, ALBUMIN in the last 168 hours. No results for input(s): LIPASE, AMYLASE in the last 168 hours. No results for input(s): AMMONIA in the last 168 hours. Cardiac Enzymes: Recent Labs  Lab 04/14/18 0316 04/14/18 0806  TROPONINI 0.08* 0.07*   Iron Studies: No results for input(s): IRON, TIBC, TRANSFERRIN, FERRITIN in the last 72 hours. PT/INR: @LABRCNTIP (inr:5)  Xrays/Other Studies: ) Results for orders placed or performed during the hospital encounter of 04/13/18 (from the past 48 hour(s))  Basic metabolic panel     Status: Abnormal   Collection Time: 04/13/18  8:45 PM  Result Value Ref Range   Sodium 135 135  - 145 mmol/L   Potassium 4.0 3.5 - 5.1 mmol/L   Chloride 94 (L) 98 - 111 mmol/L   CO2 27 22 - 32 mmol/L   Glucose, Bld 107 (H) 70 - 99 mg/dL   BUN 24 (H) 6 - 20 mg/dL   Creatinine, Ser 4.71 (H) 0.61 - 1.24 mg/dL   Calcium 8.7 (L) 8.9 - 10.3 mg/dL   GFR calc non Af Amer 12 (L) >60 mL/min   GFR calc Af Amer 14 (L) >60 mL/min    Comment: (NOTE) The eGFR has been calculated using the CKD EPI equation. This calculation has not been validated in all clinical situations. eGFR's persistently <60 mL/min signify possible Chronic Kidney Disease.    Anion gap 14 5 - 15    Comment: Performed at [facility] [address], [place], [place] [address]  CBC     Status: Abnormal   Collection Time: 04/13/18  8:45 PM  Result Value Ref Range   WBC 4.3 4.0 - 10.5 K/uL   RBC 2.54 (L) 4.22 - 5.81 MIL/uL   Hemoglobin 9.2 (L) 13.0 - 17.0 g/dL   HCT 28.3 (L) 39.0 - 52.0 %   MCV 111.4 (H) 78.0 - 100.0 fL   MCH 36.2 (H) 26.0 - 34.0 pg   MCHC 32.5 30.0 - 36.0 g/dL   RDW 17.9 (H) 11.5 - 15.5 %   Platelets 126 (L) 150 - 400 K/uL    Comment: Performed at [facility], [place] [address], [place], [place] [address]  I-stat troponin, ED     Status: None   Collection Time: 04/13/18  8:50 PM  Result Value Ref Range   Troponin i, poc 0.05 0.00 - 0.08 ng/mL   Comment 3            Comment: Due to the release kinetics of cTnI, a negative result within the first hours of the onset of symptoms does not rule out myocardial infarction with certainty. If myocardial infarction is still suspected, repeat the test at appropriate intervals.   Troponin I     Status: Abnormal   Collection Time: 04/14/18  3:16 AM  Result Value Ref Range   Troponin I 0.08 (HH) <0.03 ng/mL    Comment: CRITICAL RESULT CALLED TO, READ BACK BY AND VERIFIED WITH: Suan Halter [id number] [phone] [facility] Performed at [facility], [place] [address], [place], [place] [address]   Folate     Status: None   Collection Time: 04/14/18  3:16 AM   Result Value Ref Range   Folate 43.0 >5.9  ng/mL    Comment: RESULT CONFIRMED BY AUTOMATED DILUTION Performed at [facility], [place] [address], [place], [place] [address]   MRSA PCR Screening     Status: Abnormal   Collection Time: 04/14/18  6:30 AM  Result Value Ref Range   MRSA by PCR POSITIVE (A) NEGATIVE    Comment:        The GeneXpert MRSA Assay (FDA approved for NASAL specimens only), is one component of a comprehensive MRSA colonization surveillance program. It is not intended to diagnose MRSA infection nor to guide or monitor treatment for MRSA infections. CRITICAL RESULT CALLED TO, READ BACK BY AND VERIFIED WITHAdelene Idler [id number] FCP   Troponin I     Status: Abnormal   Collection Time: 04/14/18  8:06 AM  Result Value Ref Range   Troponin I 0.07 (HH) <0.03 ng/mL    Comment: CRITICAL VALUE NOTED.  VALUE IS CONSISTENT WITH PREVIOUSLY REPORTED AND CALLED VALUE. Performed at [facility], [place] [address], [place], [place] [address]   Vitamin B12     Status: None   Collection Time: 04/14/18  8:06 AM  Result Value Ref Range   Vitamin B-12 849 180 - 914 pg/mL    Comment: (NOTE) This assay is not validated for testing neonatal or myeloproliferative syndrome specimens for Vitamin B12 levels. Performed at [facility], [place] [address] [place], [place] [address]    Dg Chest 2 View  Result Date: 04/13/2018 CLINICAL DATA:  Initial evaluation for acute left rib pain status post recent fall 3 days ago. EXAM: CHEST - 2 VIEW COMPARISON:  Prior radiograph from 02/16/2018. FINDINGS: Moderate cardiomegaly, stable. Mediastinal silhouette within normal limits. Aortic atherosclerosis. Lungs normally inflated. Prominent vascular and interstitial congestion suggestive of mild pulmonary interstitial congestion/edema. No definite pleural effusion, although no focal infiltrates. No pneumothorax. No acute osseous abnormality. No appreciable displaced rib fracture  identified. Incompletely visualized. IMPRESSION: 1. Cardiomegaly with mild diffuse pulmonary interstitial edema. 2. Aortic atherosclerosis. Electronically Signed   By: Jeannine Boga M.D.   On: 04/13/2018 21:27    PMH:   Past Medical History:  Diagnosis Date  . Acute kidney injury superimposed on CKD ([place]) 05/24/2017  . Acute on chronic diastolic CHF (congestive heart failure) ([place]) 04/13/2018  . AKI (acute kidney injury) ([place]) 04/10/2015  . Asthma   . Asthma, chronic, unspecified asthma severity, with acute exacerbation 10/23/2017  . Atypical chest pain 02/16/2018  . CAP (community acquired pneumonia) 10/23/2017  . Cerebral infarction due to thrombosis of cerebral artery ([place])   . Cerebrovascular accident (CVA) ([place])   . Chronic kidney disease   . CKD (chronic kidney disease), stage V ([place]) 05/24/2017  . ESRD (end stage renal disease) on dialysis ([place]) 02/16/2018  . Essential hypertension 04/09/2015  . GI bleeding 05/24/2017  . History of completed stroke   . Hyperlipidemia   . Hypertension   . Hypertensive urgency 05/24/2017  . Kidney failure   . Respiratory failure, acute ([place]) 10/23/2017  . Restless leg syndrome   . Stroke ([place])   . Symptomatic anemia 05/24/2017    PSH:   Past Surgical History:  Procedure Laterality Date  . AV FISTULA PLACEMENT Left 05/28/2017   Procedure: LEFT ARM ARTERIOVENOUS (AV) FISTULA CREATION;  Surgeon: Conrad Lewisberry, MD;  Location: [facility];  Service: Vascular;  Laterality: Left;  . IR FLUORO GUIDE CV LINE RIGHT  05/25/2017  . IR US GUIDE VASC ACCESS RIGHT  05/25/2017  .  RINOPLASTY      Allergies: No Known Allergies  Medications:   Prior to Admission medications   Medication Sig Start Date End Date Taking? Authorizing Provider  acetaminophen (TYLENOL) 500 MG tablet Take 1 tablet (500 mg total) by mouth every 4 (four) hours as needed for mild pain or headache. Reported on 12/17/2015 10/24/17  Yes Park, Derenda Mis, MD  albuterol (PROVENTIL HFA;VENTOLIN HFA)  108 (90 BASE) MCG/ACT inhaler Inhale 1-2 puffs into the lungs every 6 (six) hours as needed for wheezing or shortness of breath. 04/12/15  Yes [name], MD  cinacalcet (SENSIPAR) 30 MG tablet Take 30 mg by mouth daily with supper. Do not take less than 12 hours prior to dialysis 01/25/18  Yes [provider]  clonazePAM (KLONOPIN) 0.5 MG tablet Take 0.5 mg by mouth at bedtime.    Yes [provider]  gabapentin (NEURONTIN) 100 MG capsule Take 100 mg by mouth at bedtime.    Yes [provider]  hydrALAZINE (APRESOLINE) 100 MG tablet Take 1 tablet (100 mg total) by mouth 2 (two) times daily. 11/04/17  Yes Ena Dawley, Tiffany S, PA-C  metoCLOPramide (REGLAN) 5 MG tablet Take 5 mg by mouth every 8 (eight) hours as needed for nausea.  03/29/18  Yes [provider]  metoprolol tartrate (LOPRESSOR) 100 MG tablet Take 1 tablet (100 mg total) by mouth 2 (two) times daily. 11/04/17  Yes Ena Dawley, Tiffany S, PA-C  multivitamin (RENA-VIT) TABS tablet Take 1 tablet by mouth daily. 01/13/18  Yes [provider]  pantoprazole (PROTONIX) 40 MG tablet Take 40 mg by mouth daily. 03/29/18  Yes [provider]  polyethylene glycol (MIRALAX / GLYCOLAX) packet Take 17 g by mouth daily as needed. Patient taking differently: Take 17 g by mouth daily as needed for mild constipation.  06/02/17  Yes [name], MD  rOPINIRole (REQUIP) 0.5 MG tablet Take 0.5 mg by mouth 2 (two) times daily. 01/26/18  Yes [provider]  sevelamer (RENAGEL) 800 MG tablet Take 800-1,600 mg by mouth See admin instructions. 1,644m by mouth three times daily and 8068mtwice daily with snacks 01/25/18  Yes [provider]    Discontinued Meds:   Medications Discontinued During This Encounter  Medication Reason  . fentaNYL (SUBLIMAZE) injection 50 mcg   . multivitamin (RENA-VIT) TABS tablet Duplicate  . metoprolol tartrate (LOPRESSOR) tablet 100 mg   . carvedilol (COREG)  tablet 25 mg   . hydrALAZINE (APRESOLINE) tablet 100 mg   . albuterol (PROVENTIL) (2.5 MG/3ML) 0.083% nebulizer solution 2.5 mg     Social History:  reports that he has never smoked. He has never used smokeless tobacco. He reports that he does not drink alcohol or use drugs.  Family History:   Family History  Problem Relation Age of Onset  . Cancer Mother     Blood pressure 129/61, pulse 68, temperature 98.5 F (36.9 C), temperature source Oral, resp. rate 16, height 5' 8"  (1.727 m), weight 60.7 kg, SpO2 94 %. General appearance: alert, cooperative and appears stated age Head: Normocephalic, without obvious abnormality, atraumatic Eyes: negative Neck: no adenopathy, no carotid bruit, supple, symmetrical, trachea midline and thyroid not enlarged, symmetric, no tenderness/mass/nodules Back: symmetric, no curvature. ROM normal. No CVA tenderness. Resp: clear to auscultation bilaterally Chest wall: no tenderness Cardio: regular rate and rhythm, S1, S2 normal, no murmur, click, rub or gallop GI: soft, non-tender; bowel sounds normal; no masses,  no organomegaly Extremities: edema tr Pulses: 2+ and symmetric  Skin: Skin color, texture, turgor normal. No rashes or lesions Lymph nodes: Cervical, supraclavicular, and axillary nodes normal. Neurologic: Grossly normal       Ahmyah Gidley, Hunt Oris, MD 04/14/2018, 8:47 PM

## 2018-04-14 NOTE — ED Notes (Signed)
Pt requesting more diluadid for pain. Offered ordered PRN meds, pt refusing at this time.

## 2018-04-14 NOTE — Progress Notes (Signed)
PROGRESS NOTE    Thomas Robinson  VQM:[id number] DOB: 1961/03/31 DOA: 04/13/2018 PCP: Charlott Rakes, MD  Outpatient Specialists:     Brief Narrative:  Patient is a 57 year old Caucasian male with past medical history significant for end-stage renal disease on hemodialysis, chronic diastolic CHF, chronic anemia, and hypertension.  Patient was admitted for assessment and management of chest pain and hypertensive urgency.  On presentation to the hospital, the blood pressure was documented as 206/161 mmHg.   EKG features a sinus rhythm with PVCs and LVH with secondary repolarization abnormality.  First troponin was 0.05, subsequent troponins came back at 0.08 and 0.07.  Troponin is 02/17/2018 was 0.09.  Patient is currently chest pain-free.  Blood pressure has been optimized.  Patient also has significant wheezing, and reported history of asthma.  Patient has history of intermittent chest pain, was admitted for this recently but left AMA.  He saw cardiology in the clinic a few days ago and there were plans for coronary CT that has not yet been performed.    According to the patient, he reported having fallen in the shower 2 nights ago prior to presentation, has had some left lateral chest wall pain since then, but then also developed a pressure sensation in the central chest today that had persisted.    Patient is currently chest pain-free.  On presentation, patient endorsed chronic cough, mild chronic dyspnea, orthopnea recently and mild bilateral lower extremity swelling, but no tenderness.  Denies fevers or chills.    Prior to presentation, patient alled EMS, was treated with sublingual nitroglycerin x 2 with slight improvement, and was also given a full dose aspirin.  Patient had hemodialysis on the day of presentation according to the patient.  Chest x-ray yesterday revealed mild pulmonary edema.  However, patient remains on room air.  She has been asking for pain medication (opiates)  Assessment &  Plan:   Principal Problem:   Atypical chest pain Active Problems:   History of completed stroke   Macrocytic anemia   Hypertensive urgency   ESRD on dialysis [facility])   Chest pain   Acute on chronic diastolic CHF (congestive heart failure) ([place])   1. Chest pain  - Presents with one day of central chest pressure, slightly improved with NTG prior to arrival in ED  - He does not have known CAD, but has had recurrent chest pain for which he saw cardiology in clinic a few days with plan for coronary CT with FFR, not yet performed   - He was treated with full-dose ASA with EMS prior to arrival in ED  - Initial troponin is negative, EKG with non-specific changes likely related to LVH, and CXR with pulm edema  - Continue cardiac monitoring, obtain serial troponin measurements, continue beta-blocker, repeat EKG in am.  04/14/2018: Patient has remained chest pain-free.  Will repeat an EKG.  We will repeat one more troponin.  Will have a low threshold to consult cardiology team, considering history of end-stage renal disease.  Patient's presentation is complicated by his continuous request for opiate-based pain medication.    2. Hypertension with hypertensive urgency  - SBP in low 200's in ED, improved some with analgesia  - Continue hydralazine and Lopressor    04/14/2018: Will increase the dose of hydralazine to 100 mg p.o. 3 times daily (from 100 mg p.o. twice daily). Will discontinue metoprolol. Will start patient on Coreg 12.5 mg p.o. twice daily. Will start Norvasc 10 mg p.o. once daily. Nitropaste 1 inch  every 8 hours.  The goal is to cautiously optimize patient's blood pressure.  3. ESRD  - Reports completing HD on 8/13 prior to arrival in ED  - There is no hyperkalemia, uremia, or acidosis, but he has volume overload without respiratory distress and hypertensive urgency  - SLIV, renally-dose medications, follow I/O and daily wts, may need HD again prior to Tue   04/14/2018: For  hemodialysis in the morning.  Hopefully, his hemodialysis will follow with blood pressure control.  4. Macrocytic anemia  - Hgb appears to be stable at 9.2 but macrocytosis is new with MCV now 111.4  - Check B12 and folate    5. Acute on chronic diastolic CHF  - Diffuse edema on CXR and mild peripheral edema noted  - He reports orthopnea, but not in any distress or hypoxic at rest in ED  - SLIV, may need HD again before Tues  04/14/2018: For hemodialysis in the morning  6.  Acute moderate to severe asthma with exacerbation: Nebs DuoNeb. IV Solu-Medrol Singulair Peak flow daily   DVT prophylaxis: Heparin Code Status: Full Family Communication:  Disposition Plan: Home eventually   Consultants:   We will consult nephrology.  We will have a low threshold to consult cardiology.  Procedures:   EKG ordered  Antimicrobials:   None   Subjective: Patient has kept asking for pain medication. Patient is wheezing.   Patient locates chest pain to the left lower thoracic location where he may have heart himself following the fall.   No fever or chills  Objective: Vitals:   04/14/18 1200 04/14/18 1300 04/14/18 1500 04/14/18 1557  BP: (!) 193/73 (!) 195/73  (!) 165/68  Pulse: (!) 58 (!) 54  61  Resp: (!) 8 17  18   Temp:    97.8 F (36.6 C)  TempSrc:    Oral  SpO2: 96% 95% 97% 98%  Weight:      Height:        Intake/Output Summary (Last 24 hours) at 04/14/2018 1737 Last data filed at 04/14/2018 1200 Gross per 24 hour  Intake 0 ml  Output -  Net 0 ml   Filed Weights   04/13/18 1941 04/14/18 0610  Weight: 61.2 kg 60.7 kg    Examination:  General exam: Appears calm and comfortable  Respiratory system: Very decreased air entry, with significant inspiratory and expiratory wheeze (Patient has history of asthma)  Cardiovascular system: S1 & S2 heard. Gastrointestinal system: Abdomen is nondistended, soft and nontender. No organomegaly or masses felt. Normal bowel  sounds heard. Central nervous system: Alert and oriented. No focal neurological deficits. Extremities: No leg edema.  Symmetric 5 x 5 power.  Data Reviewed: I have personally reviewed following labs and imaging studies  CBC: Recent Labs  Lab 04/13/18 2045  WBC 4.3  HGB 9.2*  HCT 28.3*  MCV 111.4*  PLT 599*   Basic Metabolic Panel: Recent Labs  Lab 04/13/18 2045  NA 135  K 4.0  CL 94*  CO2 27  GLUCOSE 107*  BUN 24*  CREATININE 4.71*  CALCIUM 8.7*   GFR: Estimated Creatinine Clearance: 14.7 mL/min (A) (by C-G formula based on SCr of 4.71 mg/dL (H)). Liver Function Tests: No results for input(s): AST, ALT, ALKPHOS, BILITOT, PROT, ALBUMIN in the last 168 hours. No results for input(s): LIPASE, AMYLASE in the last 168 hours. No results for input(s): AMMONIA in the last 168 hours. Coagulation Profile: No results for input(s): INR, PROTIME in the last 168 hours. Cardiac  Enzymes: Recent Labs  Lab 04/14/18 0316 04/14/18 0806  TROPONINI 0.08* 0.07*   BNP (last 3 results) No results for input(s): PROBNP in the last 8760 hours. HbA1C: No results for input(s): HGBA1C in the last 72 hours. CBG: No results for input(s): GLUCAP in the last 168 hours. Lipid Profile: No results for input(s): CHOL, HDL, LDLCALC, TRIG, CHOLHDL, LDLDIRECT in the last 72 hours. Thyroid Function Tests: No results for input(s): TSH, T4TOTAL, FREET4, T3FREE, THYROIDAB in the last 72 hours. Anemia Panel: Recent Labs    04/14/18 0316 04/14/18 0806  VITAMINB12  --  849  FOLATE 43.0  --    Urine analysis:    Component Value Date/Time   COLORURINE YELLOW 05/24/2017 1729   APPEARANCEUR HAZY (A) 05/24/2017 1729   LABSPEC 1.011 05/24/2017 1729   PHURINE 5.0 05/24/2017 1729   GLUCOSEU 50 (A) 05/24/2017 1729   HGBUR SMALL (A) 05/24/2017 1729   BILIRUBINUR NEGATIVE 05/24/2017 1729   KETONESUR NEGATIVE 05/24/2017 1729   PROTEINUR 100 (A) 05/24/2017 1729   UROBILINOGEN 0.2 04/09/2015 1527    NITRITE NEGATIVE 05/24/2017 1729   LEUKOCYTESUR TRACE (A) 05/24/2017 1729   Sepsis Labs: @LABRCNTIP (procalcitonin:4,lacticidven:4)  ) Recent Results (from the past 240 hour(s))  MRSA PCR Screening     Status: Abnormal   Collection Time: 04/14/18  6:30 AM  Result Value Ref Range Status   MRSA by PCR POSITIVE (A) NEGATIVE Final    Comment:        The GeneXpert MRSA Assay (FDA approved for NASAL specimens only), is one component of a comprehensive MRSA colonization surveillance program. It is not intended to diagnose MRSA infection nor to guide or monitor treatment for MRSA infections. CRITICAL RESULT CALLED TO, READ BACK BY AND VERIFIED WITHAdelene Idler [id number] FCP          Radiology Studies: Dg Chest 2 View  Result Date: 04/13/2018 CLINICAL DATA:  Initial evaluation for acute left rib pain status post recent fall 3 days ago. EXAM: CHEST - 2 VIEW COMPARISON:  Prior radiograph from 02/16/2018. FINDINGS: Moderate cardiomegaly, stable. Mediastinal silhouette within normal limits. Aortic atherosclerosis. Lungs normally inflated. Prominent vascular and interstitial congestion suggestive of mild pulmonary interstitial congestion/edema. No definite pleural effusion, although no focal infiltrates. No pneumothorax. No acute osseous abnormality. No appreciable displaced rib fracture identified. Incompletely visualized. IMPRESSION: 1. Cardiomegaly with mild diffuse pulmonary interstitial edema. 2. Aortic atherosclerosis. Electronically Signed   By: Jeannine Boga M.D.   On: 04/13/2018 21:27        Scheduled Meds: . amLODipine  10 mg Oral Daily  . aspirin EC  81 mg Oral Daily  . budesonide (PULMICORT) nebulizer solution  0.25 mg Nebulization BID  . carvedilol  12.5 mg Oral BID WC  . Chlorhexidine Gluconate Cloth  6 each Topical Q0600  . cinacalcet  30 mg Oral Q supper  . clonazePAM  0.5 mg Oral QHS  . gabapentin  100 mg Oral QHS  . heparin  5,000 Units Subcutaneous  Q8H  . hydrALAZINE  100 mg Oral Q8H  . ipratropium-albuterol  3 mL Nebulization Q6H  . methylPREDNISolone (SOLU-MEDROL) injection  40 mg Intravenous Q8H  . montelukast  10 mg Oral QHS  . multivitamin  1 tablet Oral QHS  . mupirocin ointment  1 application Nasal BID  . nitroGLYCERIN  1 inch Topical Q8H  . pantoprazole  40 mg Oral Daily  . rOPINIRole  0.5 mg Oral BID  . sevelamer carbonate  1,600 mg Oral TID  WC   Continuous Infusions:   LOS: 0 days   Severity of illness: I will change patient's status to inpatient.  Patient clinical condition as documented above, with results from the work-up done so far is such that patient will deteriorate significantly if not adequately worked up and managed as an inpatient.    Time spent: 35 minutes    Dana Allan, MD  [facility] Pager #: [phone] 7PM-7AM contact night coverage as above

## 2018-04-14 NOTE — ED Notes (Signed)
Refused hospital bed

## 2018-04-14 NOTE — Progress Notes (Addendum)
Attempted peak flow maneuvers.  Pt gave minimal effort even with continuous coaching, only reaching 110 with three attempts.  It appears that the pt could do considerably better with a legitimate effort.  Pt states "This is killing my ribs and throat".  There was no difference in flows pre versus post bronchodilator administration.

## 2018-04-15 DIAGNOSIS — I160 Hypertensive urgency: Secondary | ICD-10-CM | POA: Diagnosis not present

## 2018-04-15 DIAGNOSIS — I5033 Acute on chronic diastolic (congestive) heart failure: Secondary | ICD-10-CM | POA: Diagnosis not present

## 2018-04-15 DIAGNOSIS — N186 End stage renal disease: Secondary | ICD-10-CM | POA: Diagnosis not present

## 2018-04-15 DIAGNOSIS — J45901 Unspecified asthma with (acute) exacerbation: Secondary | ICD-10-CM | POA: Diagnosis not present

## 2018-04-15 DIAGNOSIS — D631 Anemia in chronic kidney disease: Secondary | ICD-10-CM | POA: Diagnosis not present

## 2018-04-15 DIAGNOSIS — Z8673 Personal history of transient ischemic attack (TIA), and cerebral infarction without residual deficits: Secondary | ICD-10-CM | POA: Diagnosis not present

## 2018-04-15 DIAGNOSIS — D539 Nutritional anemia, unspecified: Secondary | ICD-10-CM | POA: Diagnosis not present

## 2018-04-15 DIAGNOSIS — R0789 Other chest pain: Secondary | ICD-10-CM | POA: Diagnosis not present

## 2018-04-15 DIAGNOSIS — I132 Hypertensive heart and chronic kidney disease with heart failure and with stage 5 chronic kidney disease, or end stage renal disease: Secondary | ICD-10-CM | POA: Diagnosis not present

## 2018-04-15 DIAGNOSIS — N2581 Secondary hyperparathyroidism of renal origin: Secondary | ICD-10-CM | POA: Diagnosis not present

## 2018-04-15 DIAGNOSIS — Z992 Dependence on renal dialysis: Secondary | ICD-10-CM | POA: Diagnosis not present

## 2018-04-15 DIAGNOSIS — N179 Acute kidney failure, unspecified: Secondary | ICD-10-CM | POA: Diagnosis not present

## 2018-04-15 DIAGNOSIS — E785 Hyperlipidemia, unspecified: Secondary | ICD-10-CM | POA: Diagnosis not present

## 2018-04-15 DIAGNOSIS — I700 Atherosclerosis of aorta: Secondary | ICD-10-CM | POA: Diagnosis not present

## 2018-04-15 DIAGNOSIS — G2581 Restless legs syndrome: Secondary | ICD-10-CM | POA: Diagnosis not present

## 2018-04-15 LAB — CBC WITH DIFFERENTIAL/PLATELET
Basophils Absolute: 0 10*3/uL (ref 0.0–0.1)
Basophils Relative: 0 %
Eosinophils Absolute: 0 10*3/uL (ref 0.0–0.7)
Eosinophils Relative: 0 %
HCT: 29.7 % — ABNORMAL LOW (ref 39.0–52.0)
Hemoglobin: 9.7 g/dL — ABNORMAL LOW (ref 13.0–17.0)
Lymphocytes Relative: 6 %
Lymphs Abs: 0.2 10*3/uL — ABNORMAL LOW (ref 0.7–4.0)
MCH: 36.1 pg — ABNORMAL HIGH (ref 26.0–34.0)
MCHC: 32.7 g/dL (ref 30.0–36.0)
MCV: 110.4 fL — ABNORMAL HIGH (ref 78.0–100.0)
Monocytes Absolute: 0.1 10*3/uL (ref 0.1–1.0)
Monocytes Relative: 2 %
Neutro Abs: 3.7 10*3/uL (ref 1.7–7.7)
Neutrophils Relative %: 92 %
Platelets: 174 10*3/uL (ref 150–400)
RBC: 2.69 MIL/uL — ABNORMAL LOW (ref 4.22–5.81)
RDW: 17.6 % — ABNORMAL HIGH (ref 11.5–15.5)
WBC: 4 10*3/uL (ref 4.0–10.5)

## 2018-04-15 LAB — RENAL FUNCTION PANEL
Albumin: 3.1 g/dL — ABNORMAL LOW (ref 3.5–5.0)
Anion gap: 18 — ABNORMAL HIGH (ref 5–15)
BUN: 44 mg/dL — ABNORMAL HIGH (ref 6–20)
CO2: 24 mmol/L (ref 22–32)
Calcium: 9.1 mg/dL (ref 8.9–10.3)
Chloride: 93 mmol/L — ABNORMAL LOW (ref 98–111)
Creatinine, Ser: 7.13 mg/dL — ABNORMAL HIGH (ref 0.61–1.24)
GFR calc Af Amer: 9 mL/min — ABNORMAL LOW (ref >60)
GFR calc non Af Amer: 8 mL/min — ABNORMAL LOW (ref >60)
Glucose, Bld: 116 mg/dL — ABNORMAL HIGH (ref 70–99)
Phosphorus: 6.9 mg/dL — ABNORMAL HIGH (ref 2.5–4.6)
Potassium: 4.7 mmol/L (ref 3.5–5.1)
Sodium: 135 mmol/L (ref 135–145)

## 2018-04-15 LAB — MAGNESIUM: Magnesium: 2.7 mg/dL — ABNORMAL HIGH (ref 1.7–2.4)

## 2018-04-15 LAB — HEPATITIS B SURFACE ANTIGEN: HEP B S AG: NEGATIVE

## 2018-04-15 MED ORDER — LIDOCAINE-PRILOCAINE 2.5-2.5 % EX CREA
1.0000 "application " | TOPICAL_CREAM | CUTANEOUS | Status: DC | PRN
  Filled 2018-04-15: qty 5

## 2018-04-15 MED ORDER — HYDRALAZINE HCL 100 MG PO TABS: 100.0000 mg | ORAL_TABLET | Freq: Three times a day (TID) | ORAL | 0 refills | Status: DC

## 2018-04-15 MED ORDER — PREDNISONE 10 MG PO TABS: ORAL_TABLET | ORAL | 0 refills | Status: DC

## 2018-04-15 MED ORDER — ACETAMINOPHEN 500 MG PO TABS: 500.0000 mg | ORAL_TABLET | Freq: Three times a day (TID) | ORAL | 0 refills | Status: AC | PRN

## 2018-04-15 MED ORDER — HEPARIN SODIUM (PORCINE) 1000 UNIT/ML DIALYSIS: 1000.0000 [IU] | INTRAMUSCULAR | Status: DC | PRN

## 2018-04-15 MED ORDER — AMLODIPINE BESYLATE 10 MG PO TABS: 10.0000 mg | ORAL_TABLET | Freq: Every day | ORAL | 0 refills | Status: DC

## 2018-04-15 MED ORDER — HEPARIN SODIUM (PORCINE) 1000 UNIT/ML DIALYSIS: 1800.0000 [IU] | Freq: Once | INTRAMUSCULAR | Status: DC

## 2018-04-15 MED ORDER — ASPIRIN 81 MG PO TBEC: 81.0000 mg | DELAYED_RELEASE_TABLET | Freq: Every day | ORAL | 0 refills | Status: DC

## 2018-04-15 MED ORDER — MONTELUKAST SODIUM 10 MG PO TABS: 10.0000 mg | ORAL_TABLET | Freq: Every day | ORAL | 0 refills | Status: DC

## 2018-04-15 MED ORDER — BUDESONIDE-FORMOTEROL FUMARATE 80-4.5 MCG/ACT IN AERO: 2.0000 | INHALATION_SPRAY | Freq: Two times a day (BID) | RESPIRATORY_TRACT | 12 refills | Status: DC

## 2018-04-15 MED ORDER — SODIUM CHLORIDE 0.9 % IV SOLN: 100.0000 mL | INTRAVENOUS | Status: DC | PRN

## 2018-04-15 MED ORDER — PENTAFLUOROPROP-TETRAFLUOROETH EX AERO
1.0000 "application " | INHALATION_SPRAY | CUTANEOUS | Status: DC | PRN
  Filled 2018-04-15: qty 103.5

## 2018-04-15 MED ORDER — CARVEDILOL 12.5 MG PO TABS: 12.5000 mg | ORAL_TABLET | Freq: Two times a day (BID) | ORAL | 0 refills | Status: DC

## 2018-04-15 MED ORDER — ALTEPLASE 2 MG IJ SOLR: 2.0000 mg | Freq: Once | INTRAMUSCULAR | Status: DC | PRN

## 2018-04-15 MED ORDER — LIDOCAINE HCL (PF) 1 % IJ SOLN: 5.0000 mL | INTRAMUSCULAR | Status: DC | PRN

## 2018-04-15 MED ORDER — MUPIROCIN 2 % EX OINT: 1.0000 "application " | TOPICAL_OINTMENT | Freq: Two times a day (BID) | CUTANEOUS | 0 refills | Status: AC

## 2018-04-15 NOTE — Progress Notes (Signed)
Patient returned to room 2C03 by transporter via bed from dialysis.

## 2018-04-15 NOTE — Progress Notes (Signed)
Written and verbal discharge instructions given to patient. All questions answered. PIV removed per discharge protocol. All personal belongings returned to patient. Taxi voucher provided by Education officer, museum. Patient transported by CNA via wheelchair to taxi for discharge.

## 2018-04-15 NOTE — Plan of Care (Signed)

## 2018-04-15 NOTE — Discharge Summary (Signed)
Physician Discharge Summary  Patient ID: Thomas Robinson MRN: [id number] DOB/AGE: 01/08/1961 57 y.o.  Admit date: 04/13/2018 Discharge date: 04/15/2018  Admission Diagnoses:  Discharge Diagnoses:  Principal Problem:   Atypical chest pain Active Problems:   History of completed stroke   Macrocytic anemia   Hypertensive urgency   ESRD on dialysis [facility])   Chest pain   Acute on chronic diastolic CHF (congestive heart failure) ([place])   Discharged Condition: stable  Hospital Course:  Patient is a 57 year old Caucasian male with past medical history significant for end-stage renal disease on hemodialysis, chronic diastolic CHF, asthma, chronic anemia, and hypertension.  Patient was admitted with chest pain and hypertensive urgency.  On presentation to the hospital, the blood pressure was documented as 206/161 mmHg.  EKG revealed sinus rhythm with PVCs and LVH with secondary repolarization abnormality.  First troponin was 0.05, subsequent troponins came back at 0.08 and 0.07.  Troponin on 02/17/2018 was 0.09.  Patient was admitted for chest pain recently but left the hospital [place]. Patient saw cardiology at the clinic a few days prior to presentation, and there were plans for coronary CT that has not yet been performed.   According to the patient, he fell in the shower 2 nights prior to presentation to the hospital, and has had some left lateral chest wall pain.  Patient also reported having developed a pressure sensation at the central chest area on the day of presentation that had persisted.  On presentation, patient endorsed chronic cough, mild chronic dyspnea, orthopnea recently and mild bilateral lower extremity swelling, but no tenderness. Patient was also wheezing significantly.  Patient was admitted for further assessment and management.  Patient's blood pressure was cautiously optimized.  Patient underwent hemodialysis on 2 occasions during the hospital stay.  Patient was  treated for acute asthma exacerbation with steroids and nebulizer treatment.  Patient's blood pressure medications were optimized.  Patient's care was discussed with the cardiology team, and it was advised that patient should follow-up with cardiology team on outpatient basis to complete the planned work-up (coronary CT).  Patient is back to baseline.  Asthma symptoms have resolved.  Blood pressure is optimized.  Chest pain has resolved.  Patient will be discharged to the care of the primary care provider, nephrology and cardiology team.    Chest pain: -Patient has had recurrent chest pain for which he saw cardiology in clinic a few days presentation with plan for coronary CT with FFR (Not yet performed) patient. -Patient was treated with full-dose ASA with EMS prior to arrival in ED  - Initial troponin was negative, EKG with non-specific changes likely related to LVH, and CXR with pulm edema -Cardiac monitoring was continued.  Serial troponin was obtained.  Patient was worked up for possible acute coronary syndrome.  Patient's care was discussed with cardiology team, and outpatient follow-up was advised. -Patient's blood pressure was also noted to be significantly elevated on presentation.  Hypertension with hypertensive urgency -SBP on presentation was greater than 200 mmHg.   -Blood pressure was cautiously optimize.  Pain was controlled. -Patient's blood pressure was managed with hydralazine, metoprolol was changed to Coreg, Norvasc was introduced and Nitropaste was also used.    ESRD on hemodialysis: -Patient reported reported completing hemodialysis on the day of admission.   -No indication for emergent renal replacement therapy noted on presentation.   -Hemodialysis was continued during the hospital stay.   Acute on chronic diastolic CHF/pulmonary edema - Diffuse edema on CXRand mild peripheral  edema noted -Patient reported orthopnea on presentation.  -This was managed with  hemodialysis.  Acute moderate to severe asthma with exacerbation: Nebs DuoNeb. IV Solu-Medrol Singulair Peak flow daily  Consults: nephrology.  Patient care discussed with the cardiology team (patient to follow with cardiology on an outpatient basis).  Discharge Exam: Blood pressure 132/66, pulse 88, temperature 98.3 F (36.8 C), temperature source Oral, resp. rate 20, height 5\' 8"  (1.727 m), weight 58.7 kg, SpO2 95 %.   Disposition: Discharge disposition: 01-Home or Self Care   Discharge Instructions    Diet - low sodium heart healthy   Complete by:  As directed    Discharge instructions   Complete by:  As directed    Please check your blood pressure 4 times daily for the next 1 week. Call  Your Doctor for Systolic BP greater than 094 mmHg or less than 100 mmHg   Increase activity slowly   Complete by:  As directed      Allergies as of 04/15/2018   No Known Allergies     Medication List    STOP taking these medications   metoprolol tartrate 100 MG tablet Commonly known as:  LOPRESSOR     TAKE these medications   acetaminophen 500 MG tablet Commonly known as:  TYLENOL Take 1 tablet (500 mg total) by mouth every 8 (eight) hours as needed for up to 3 days for mild pain or headache. Reported on 12/17/2015 What changed:  when to take this   albuterol 108 (90 Base) MCG/ACT inhaler Commonly known as:  PROVENTIL HFA;VENTOLIN HFA Inhale 1-2 puffs into the lungs every 6 (six) hours as needed for wheezing or shortness of breath.   amLODipine 10 MG tablet Commonly known as:  NORVASC Take 1 tablet (10 mg total) by mouth daily. Start taking on:  04/16/2018   aspirin 81 MG EC tablet Take 1 tablet (81 mg total) by mouth daily. Start taking on:  04/16/2018   budesonide-formoterol 80-4.5 MCG/ACT inhaler Commonly known as:  SYMBICORT Inhale 2 puffs into the lungs 2 (two) times daily.   carvedilol 12.5 MG tablet Commonly known as:  COREG Take 1 tablet (12.5 mg total) by mouth  2 (two) times daily with a meal.   cinacalcet 30 MG tablet Commonly known as:  SENSIPAR Take 30 mg by mouth daily with supper. Do not take less than 12 hours prior to dialysis   clonazePAM 0.5 MG tablet Commonly known as:  KLONOPIN Take 0.5 mg by mouth at bedtime.   gabapentin 100 MG capsule Commonly known as:  NEURONTIN Take 100 mg by mouth at bedtime.   hydrALAZINE 100 MG tablet Commonly known as:  APRESOLINE Take 1 tablet (100 mg total) by mouth every 8 (eight) hours. What changed:  when to take this   metoCLOPramide 5 MG tablet Commonly known as:  REGLAN Take 5 mg by mouth every 8 (eight) hours as needed for nausea.   montelukast 10 MG tablet Commonly known as:  SINGULAIR Take 1 tablet (10 mg total) by mouth at bedtime.   multivitamin Tabs tablet Take 1 tablet by mouth daily.   mupirocin ointment 2 % Commonly known as:  BACTROBAN Place 1 application into the nose 2 (two) times daily for 4 days.   pantoprazole 40 MG tablet Commonly known as:  PROTONIX Take 40 mg by mouth daily.   polyethylene glycol packet Commonly known as:  MIRALAX / GLYCOLAX Take 17 g by mouth daily as needed. What changed:  reasons to  take this   predniSONE 10 MG tablet Commonly known as:  DELTASONE Prednisone 60 mg daily for 3 days, then 40 mg po daily for 3 days, then 30 mg po daily for 3 days, then 20 mg po daily for 3 days and then 10 mg po daily for 3 days.   rOPINIRole 0.5 MG tablet Commonly known as:  REQUIP Take 0.5 mg by mouth 2 (two) times daily.   sevelamer 800 MG tablet Commonly known as:  RENAGEL Take 800-1,600 mg by mouth See admin instructions. 1,600mg  by mouth three times daily and 800mg  twice daily with snacks        Signed: Bonnell Public 04/15/2018, 2:21 PM

## 2018-04-15 NOTE — Procedures (Signed)
Tolerating HD.  Goal reached at 2600cc, BP acceptable.  No instability.  Erling Cruz, MD

## 2018-04-15 NOTE — Progress Notes (Signed)
Patient taken via bed by transporter for dialysis.

## 2018-04-15 NOTE — Plan of Care (Signed)
  Problem: Education: Goal: Knowledge of General Education information will improve Description Including pain rating scale, medication(s)/side effects and non-pharmacologic comfort measures 04/15/2018 1436 by Milderd Meager, RN Outcome: Adequate for Discharge 04/15/2018 1232 by Milderd Meager, RN Outcome: Progressing   Problem: Health Behavior/Discharge Planning: Goal: Ability to manage health-related needs will improve 04/15/2018 1436 by Milderd Meager, RN Outcome: Adequate for Discharge 04/15/2018 1232 by Milderd Meager, RN Outcome: Progressing   Problem: Clinical Measurements: Goal: Ability to maintain clinical measurements within normal limits will improve 04/15/2018 1436 by Milderd Meager, RN Outcome: Adequate for Discharge 04/15/2018 1232 by Milderd Meager, RN Outcome: Progressing Goal: Will remain free from infection 04/15/2018 1436 by Milderd Meager, RN Outcome: Adequate for Discharge 04/15/2018 1232 by Milderd Meager, RN Outcome: Progressing Goal: Diagnostic test results will improve 04/15/2018 1436 by Milderd Meager, RN Outcome: Adequate for Discharge 04/15/2018 1232 by Milderd Meager, RN Outcome: Progressing Goal: Respiratory complications will improve 04/15/2018 1436 by Milderd Meager, RN Outcome: Adequate for Discharge 04/15/2018 1232 by Milderd Meager, RN Outcome: Progressing Goal: Cardiovascular complication will be avoided 04/15/2018 1436 by Milderd Meager, RN Outcome: Adequate for Discharge 04/15/2018 1232 by Milderd Meager, RN Outcome: Progressing   Problem: Activity: Goal: Risk for activity intolerance will decrease 04/15/2018 1436 by Milderd Meager, RN Outcome: Adequate for Discharge 04/15/2018 1232 by Milderd Meager, RN Outcome: Progressing   Problem: Nutrition: Goal: Adequate nutrition will be maintained 04/15/2018 1436 by Milderd Meager, RN Outcome: Adequate for Discharge 04/15/2018 1232 by Milderd Meager, RN Outcome: Progressing   Problem: Coping: Goal: Level of anxiety will decrease 04/15/2018 1436 by Milderd Meager, RN Outcome: Adequate for  Discharge 04/15/2018 1232 by Milderd Meager, RN Outcome: Progressing   Problem: Elimination: Goal: Will not experience complications related to bowel motility Outcome: Adequate for Discharge Goal: Will not experience complications related to urinary retention Outcome: Adequate for Discharge   Problem: Pain Managment: Goal: General experience of comfort will improve 04/15/2018 1436 by Milderd Meager, RN Outcome: Adequate for Discharge 04/15/2018 1232 by Milderd Meager, RN Outcome: Progressing   Problem: Safety: Goal: Ability to remain free from injury will improve 04/15/2018 1436 by Milderd Meager, RN Outcome: Adequate for Discharge 04/15/2018 1232 by Milderd Meager, RN Outcome: Progressing   Problem: Skin Integrity: Goal: Risk for impaired skin integrity will decrease 04/15/2018 1436 by Milderd Meager, RN Outcome: Adequate for Discharge 04/15/2018 1232 by Milderd Meager, RN Outcome: Progressing

## 2018-04-17 DIAGNOSIS — D509 Iron deficiency anemia, unspecified: Secondary | ICD-10-CM | POA: Diagnosis not present

## 2018-04-17 DIAGNOSIS — N186 End stage renal disease: Secondary | ICD-10-CM | POA: Diagnosis not present

## 2018-04-17 DIAGNOSIS — E876 Hypokalemia: Secondary | ICD-10-CM | POA: Diagnosis not present

## 2018-04-17 DIAGNOSIS — D631 Anemia in chronic kidney disease: Secondary | ICD-10-CM | POA: Diagnosis not present

## 2018-04-17 DIAGNOSIS — Z23 Encounter for immunization: Secondary | ICD-10-CM | POA: Diagnosis not present

## 2018-04-17 DIAGNOSIS — N2581 Secondary hyperparathyroidism of renal origin: Secondary | ICD-10-CM | POA: Diagnosis not present

## 2018-04-20 DIAGNOSIS — E876 Hypokalemia: Secondary | ICD-10-CM | POA: Diagnosis not present

## 2018-04-20 DIAGNOSIS — N186 End stage renal disease: Secondary | ICD-10-CM | POA: Diagnosis not present

## 2018-04-20 DIAGNOSIS — N2581 Secondary hyperparathyroidism of renal origin: Secondary | ICD-10-CM | POA: Diagnosis not present

## 2018-04-20 DIAGNOSIS — Z23 Encounter for immunization: Secondary | ICD-10-CM | POA: Diagnosis not present

## 2018-04-20 DIAGNOSIS — D631 Anemia in chronic kidney disease: Secondary | ICD-10-CM | POA: Diagnosis not present

## 2018-04-20 DIAGNOSIS — D509 Iron deficiency anemia, unspecified: Secondary | ICD-10-CM | POA: Diagnosis not present

## 2018-04-22 DIAGNOSIS — E876 Hypokalemia: Secondary | ICD-10-CM | POA: Diagnosis not present

## 2018-04-22 DIAGNOSIS — N186 End stage renal disease: Secondary | ICD-10-CM | POA: Diagnosis not present

## 2018-04-22 DIAGNOSIS — N2581 Secondary hyperparathyroidism of renal origin: Secondary | ICD-10-CM | POA: Diagnosis not present

## 2018-04-22 DIAGNOSIS — Z23 Encounter for immunization: Secondary | ICD-10-CM | POA: Diagnosis not present

## 2018-04-22 DIAGNOSIS — D509 Iron deficiency anemia, unspecified: Secondary | ICD-10-CM | POA: Diagnosis not present

## 2018-04-22 DIAGNOSIS — D631 Anemia in chronic kidney disease: Secondary | ICD-10-CM | POA: Diagnosis not present

## 2018-04-24 DIAGNOSIS — N186 End stage renal disease: Secondary | ICD-10-CM | POA: Diagnosis not present

## 2018-04-24 DIAGNOSIS — D631 Anemia in chronic kidney disease: Secondary | ICD-10-CM | POA: Diagnosis not present

## 2018-04-24 DIAGNOSIS — N2581 Secondary hyperparathyroidism of renal origin: Secondary | ICD-10-CM | POA: Diagnosis not present

## 2018-04-24 DIAGNOSIS — E876 Hypokalemia: Secondary | ICD-10-CM | POA: Diagnosis not present

## 2018-04-24 DIAGNOSIS — Z23 Encounter for immunization: Secondary | ICD-10-CM | POA: Diagnosis not present

## 2018-04-24 DIAGNOSIS — D509 Iron deficiency anemia, unspecified: Secondary | ICD-10-CM | POA: Diagnosis not present

## 2018-04-27 DIAGNOSIS — N186 End stage renal disease: Secondary | ICD-10-CM | POA: Diagnosis not present

## 2018-04-27 DIAGNOSIS — Z23 Encounter for immunization: Secondary | ICD-10-CM | POA: Diagnosis not present

## 2018-04-27 DIAGNOSIS — D631 Anemia in chronic kidney disease: Secondary | ICD-10-CM | POA: Diagnosis not present

## 2018-04-27 DIAGNOSIS — D509 Iron deficiency anemia, unspecified: Secondary | ICD-10-CM | POA: Diagnosis not present

## 2018-04-27 DIAGNOSIS — E876 Hypokalemia: Secondary | ICD-10-CM | POA: Diagnosis not present

## 2018-04-27 DIAGNOSIS — N2581 Secondary hyperparathyroidism of renal origin: Secondary | ICD-10-CM | POA: Diagnosis not present

## 2018-04-28 IMAGING — DX DG CHEST 2V
2 series · 2 of 2 positions shown · non-contrast
Comparison: 12/05/2016

CLINICAL DATA: Cough and cold 1 week.  No dialysis for 1 week.

EXAM:
CHEST  2 VIEW

[w chest pa]
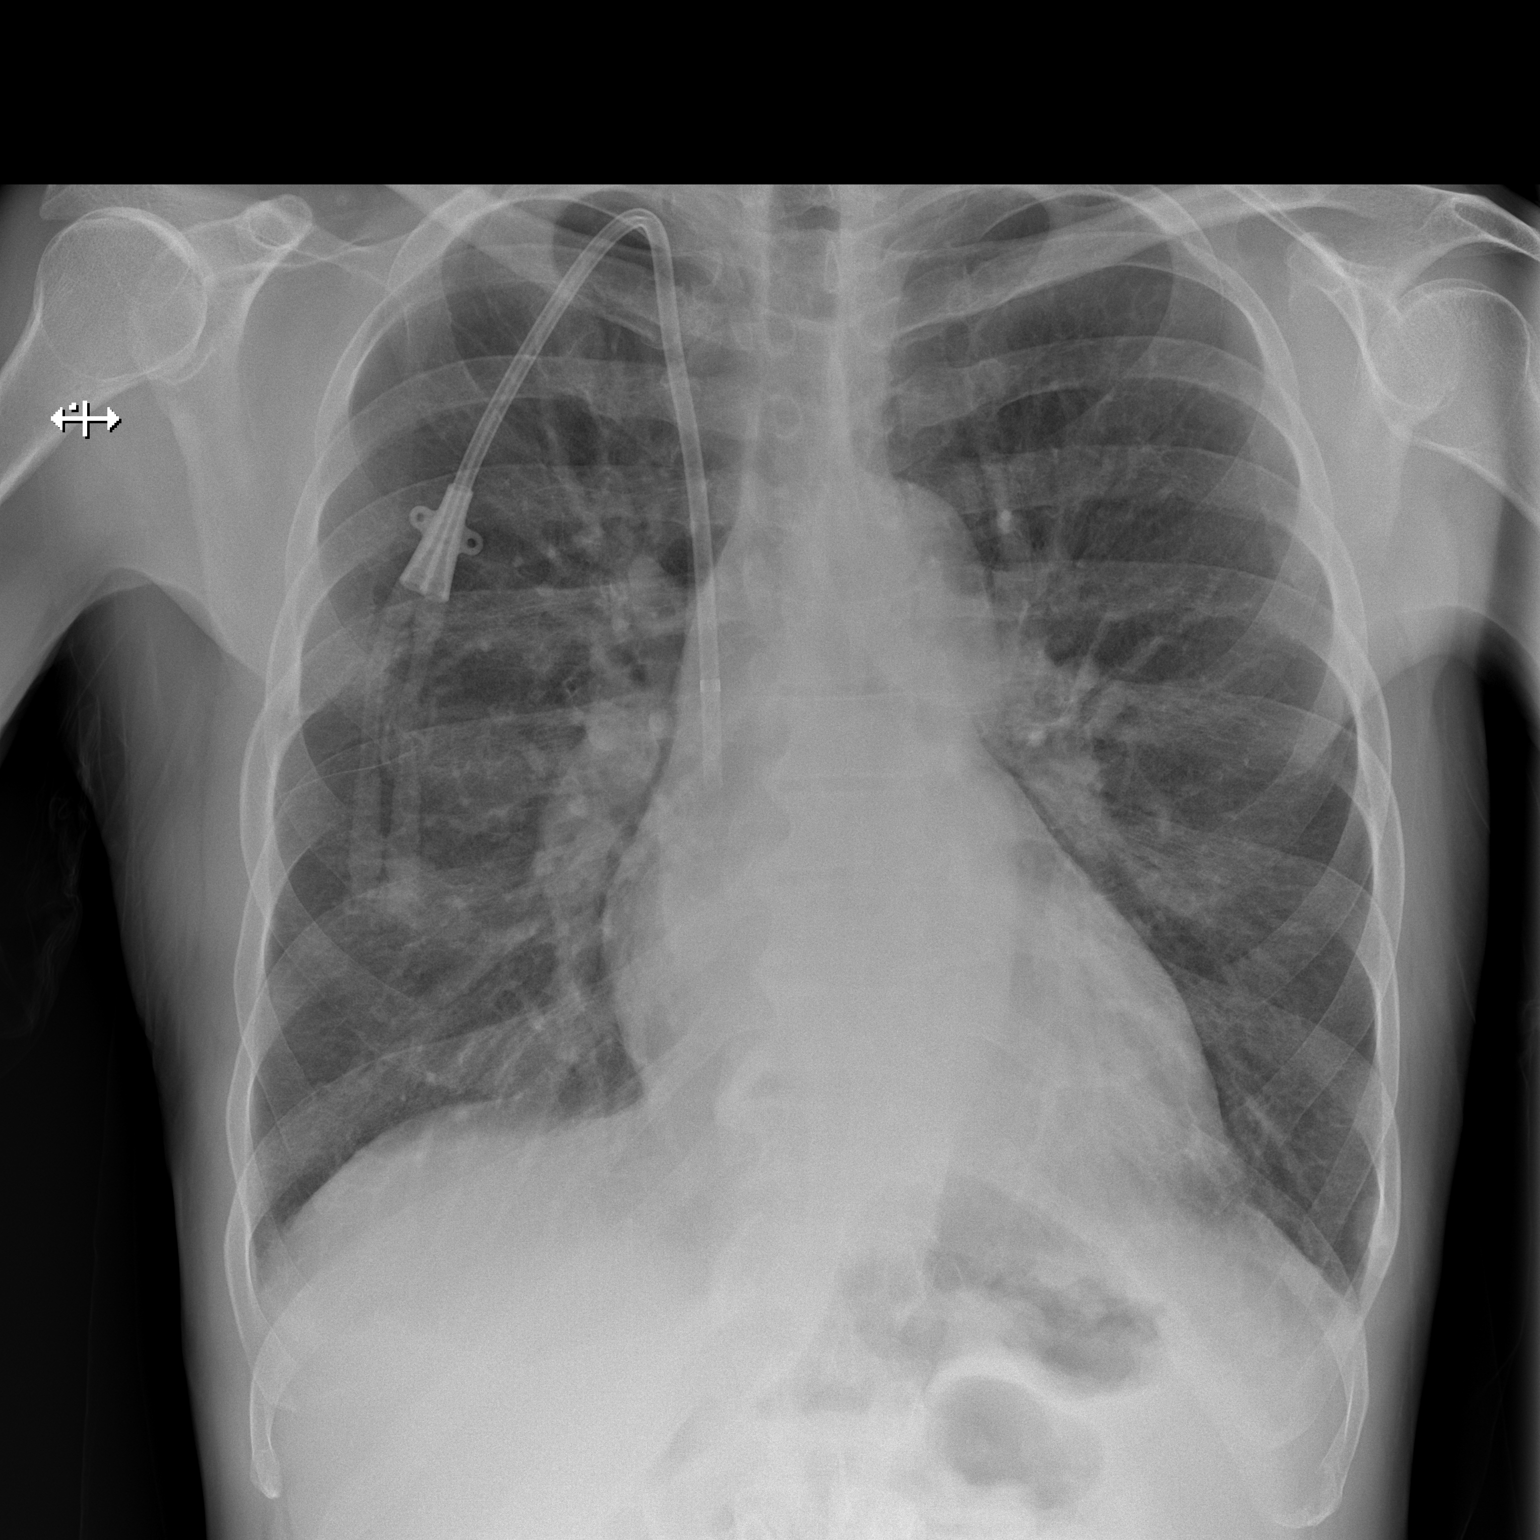

[w chest lat]
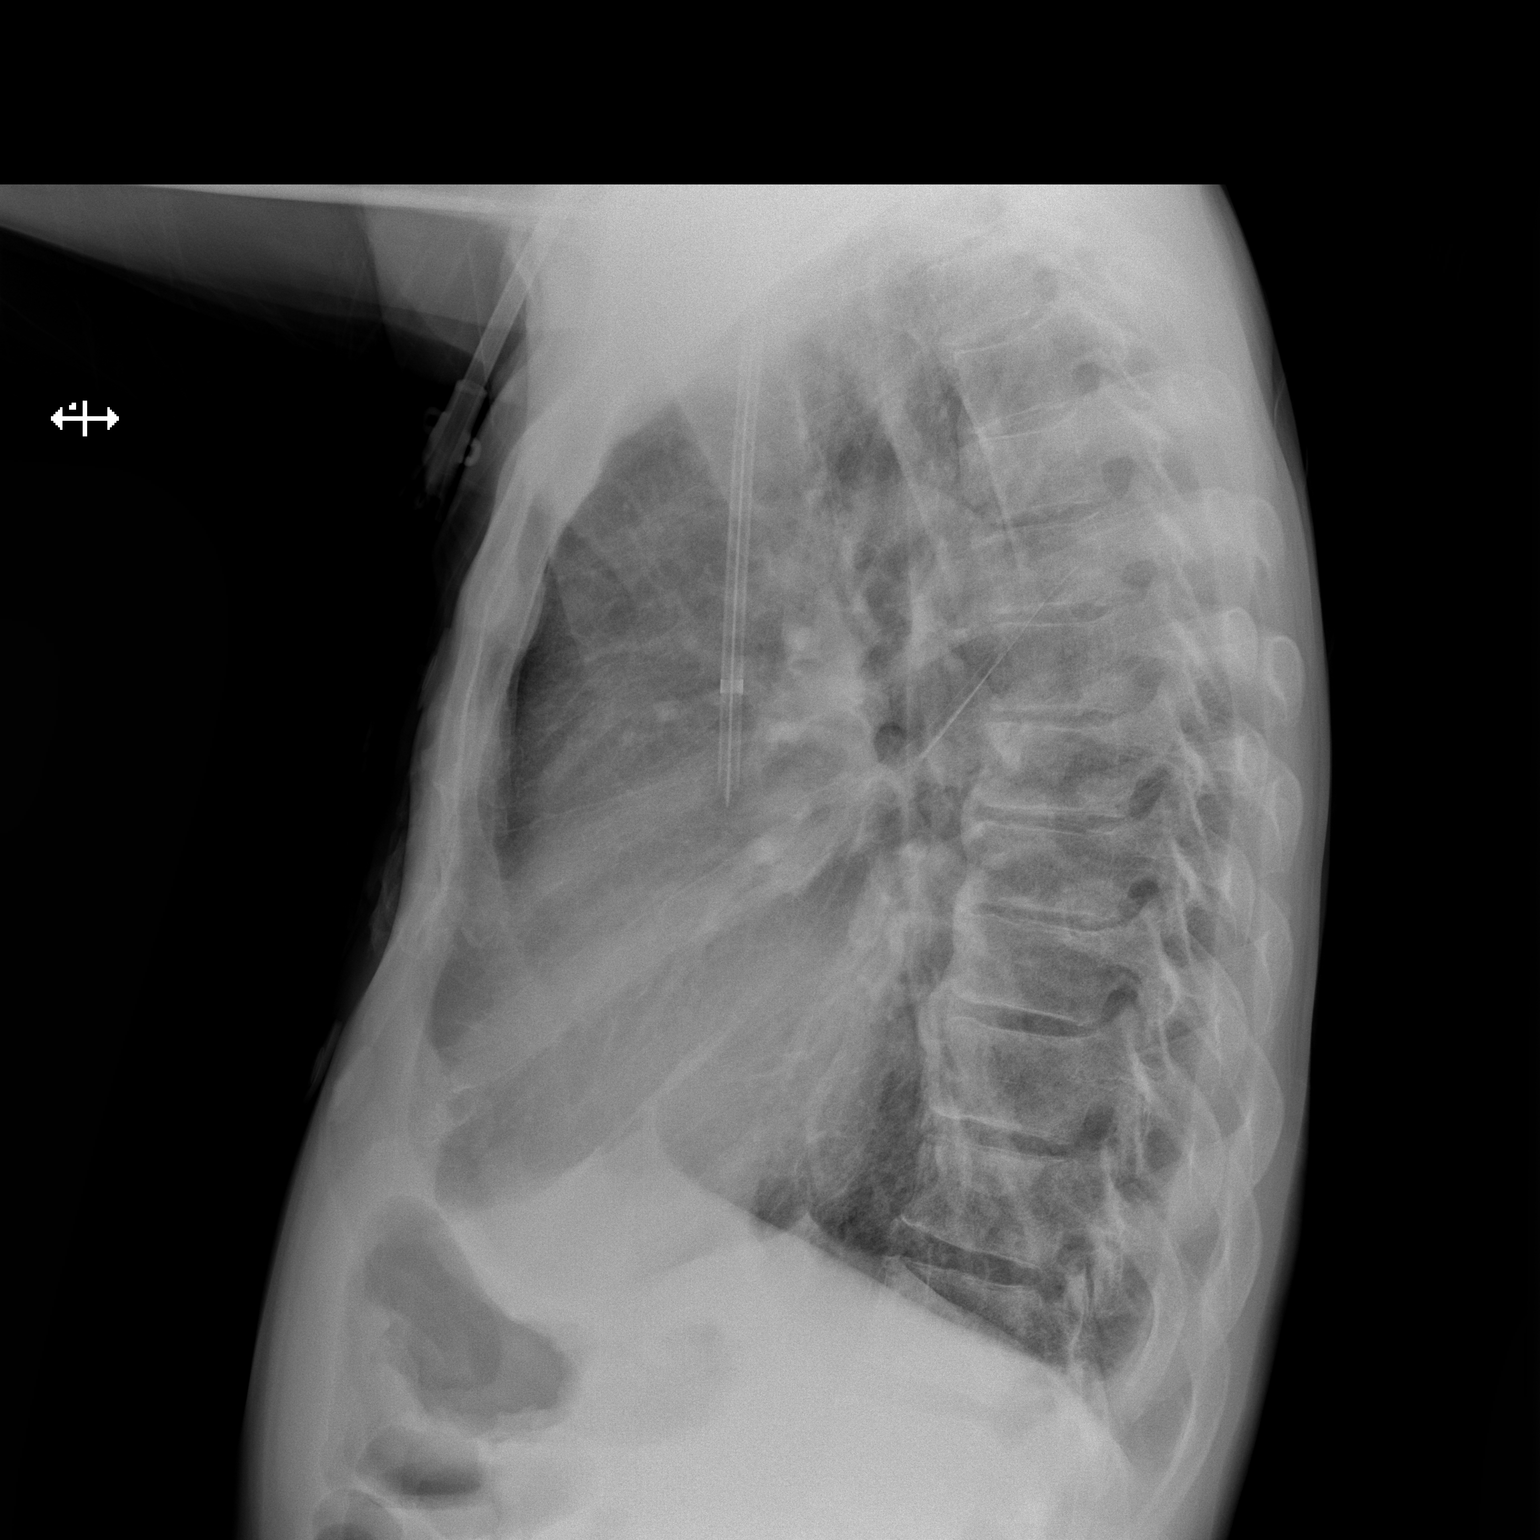

[2 of 2 positions shown; findings below may reference images not displayed]

FINDINGS: Right IJ dialysis catheter has tip over the SVC. Lungs are
adequately inflated without focal airspace consolidation or
effusion. There is mild prominence of the perihilar markings
suggesting mild vascular congestion. Cardiomediastinal silhouette
and remainder of the exam is unchanged.
IMPRESSION: Findings suggesting mild vascular congestion.

## 2018-04-29 DIAGNOSIS — N2581 Secondary hyperparathyroidism of renal origin: Secondary | ICD-10-CM | POA: Diagnosis not present

## 2018-04-29 DIAGNOSIS — D509 Iron deficiency anemia, unspecified: Secondary | ICD-10-CM | POA: Diagnosis not present

## 2018-04-29 DIAGNOSIS — Z23 Encounter for immunization: Secondary | ICD-10-CM | POA: Diagnosis not present

## 2018-04-29 DIAGNOSIS — E876 Hypokalemia: Secondary | ICD-10-CM | POA: Diagnosis not present

## 2018-04-29 DIAGNOSIS — D631 Anemia in chronic kidney disease: Secondary | ICD-10-CM | POA: Diagnosis not present

## 2018-04-29 DIAGNOSIS — N186 End stage renal disease: Secondary | ICD-10-CM | POA: Diagnosis not present

## 2018-05-01 DIAGNOSIS — N2581 Secondary hyperparathyroidism of renal origin: Secondary | ICD-10-CM | POA: Diagnosis not present

## 2018-05-01 DIAGNOSIS — Z23 Encounter for immunization: Secondary | ICD-10-CM | POA: Diagnosis not present

## 2018-05-01 DIAGNOSIS — D509 Iron deficiency anemia, unspecified: Secondary | ICD-10-CM | POA: Diagnosis not present

## 2018-05-01 DIAGNOSIS — N186 End stage renal disease: Secondary | ICD-10-CM | POA: Diagnosis not present

## 2018-05-01 DIAGNOSIS — D631 Anemia in chronic kidney disease: Secondary | ICD-10-CM | POA: Diagnosis not present

## 2018-05-01 DIAGNOSIS — E876 Hypokalemia: Secondary | ICD-10-CM | POA: Diagnosis not present

## 2018-05-02 DIAGNOSIS — N186 End stage renal disease: Secondary | ICD-10-CM | POA: Diagnosis not present

## 2018-05-02 DIAGNOSIS — Z992 Dependence on renal dialysis: Secondary | ICD-10-CM | POA: Diagnosis not present

## 2018-05-02 DIAGNOSIS — I10 Essential (primary) hypertension: Secondary | ICD-10-CM | POA: Diagnosis not present

## 2018-05-04 DIAGNOSIS — E876 Hypokalemia: Secondary | ICD-10-CM | POA: Diagnosis not present

## 2018-05-04 DIAGNOSIS — D631 Anemia in chronic kidney disease: Secondary | ICD-10-CM | POA: Diagnosis not present

## 2018-05-04 DIAGNOSIS — Z23 Encounter for immunization: Secondary | ICD-10-CM | POA: Diagnosis not present

## 2018-05-04 DIAGNOSIS — N186 End stage renal disease: Secondary | ICD-10-CM | POA: Diagnosis not present

## 2018-05-04 DIAGNOSIS — N2581 Secondary hyperparathyroidism of renal origin: Secondary | ICD-10-CM | POA: Diagnosis not present

## 2018-05-07 DIAGNOSIS — E876 Hypokalemia: Secondary | ICD-10-CM | POA: Diagnosis not present

## 2018-05-07 DIAGNOSIS — D631 Anemia in chronic kidney disease: Secondary | ICD-10-CM | POA: Diagnosis not present

## 2018-05-07 DIAGNOSIS — Z23 Encounter for immunization: Secondary | ICD-10-CM | POA: Diagnosis not present

## 2018-05-07 DIAGNOSIS — N186 End stage renal disease: Secondary | ICD-10-CM | POA: Diagnosis not present

## 2018-05-07 DIAGNOSIS — N2581 Secondary hyperparathyroidism of renal origin: Secondary | ICD-10-CM | POA: Diagnosis not present

## 2018-05-11 DIAGNOSIS — Z23 Encounter for immunization: Secondary | ICD-10-CM | POA: Diagnosis not present

## 2018-05-11 DIAGNOSIS — N2581 Secondary hyperparathyroidism of renal origin: Secondary | ICD-10-CM | POA: Diagnosis not present

## 2018-05-11 DIAGNOSIS — D631 Anemia in chronic kidney disease: Secondary | ICD-10-CM | POA: Diagnosis not present

## 2018-05-11 DIAGNOSIS — N186 End stage renal disease: Secondary | ICD-10-CM | POA: Diagnosis not present

## 2018-05-11 DIAGNOSIS — E876 Hypokalemia: Secondary | ICD-10-CM | POA: Diagnosis not present

## 2018-05-14 DIAGNOSIS — N186 End stage renal disease: Secondary | ICD-10-CM | POA: Diagnosis not present

## 2018-05-14 DIAGNOSIS — E876 Hypokalemia: Secondary | ICD-10-CM | POA: Diagnosis not present

## 2018-05-14 DIAGNOSIS — Z23 Encounter for immunization: Secondary | ICD-10-CM | POA: Diagnosis not present

## 2018-05-14 DIAGNOSIS — D631 Anemia in chronic kidney disease: Secondary | ICD-10-CM | POA: Diagnosis not present

## 2018-05-14 DIAGNOSIS — N2581 Secondary hyperparathyroidism of renal origin: Secondary | ICD-10-CM | POA: Diagnosis not present

## 2018-05-18 DIAGNOSIS — D631 Anemia in chronic kidney disease: Secondary | ICD-10-CM | POA: Diagnosis not present

## 2018-05-18 DIAGNOSIS — Z23 Encounter for immunization: Secondary | ICD-10-CM | POA: Diagnosis not present

## 2018-05-18 DIAGNOSIS — N186 End stage renal disease: Secondary | ICD-10-CM | POA: Diagnosis not present

## 2018-05-18 DIAGNOSIS — E876 Hypokalemia: Secondary | ICD-10-CM | POA: Diagnosis not present

## 2018-05-18 DIAGNOSIS — N2581 Secondary hyperparathyroidism of renal origin: Secondary | ICD-10-CM | POA: Diagnosis not present

## 2018-05-20 DIAGNOSIS — N2581 Secondary hyperparathyroidism of renal origin: Secondary | ICD-10-CM | POA: Diagnosis not present

## 2018-05-20 DIAGNOSIS — D631 Anemia in chronic kidney disease: Secondary | ICD-10-CM | POA: Diagnosis not present

## 2018-05-20 DIAGNOSIS — E876 Hypokalemia: Secondary | ICD-10-CM | POA: Diagnosis not present

## 2018-05-20 DIAGNOSIS — Z23 Encounter for immunization: Secondary | ICD-10-CM | POA: Diagnosis not present

## 2018-05-20 DIAGNOSIS — N186 End stage renal disease: Secondary | ICD-10-CM | POA: Diagnosis not present

## 2018-05-22 DIAGNOSIS — N2581 Secondary hyperparathyroidism of renal origin: Secondary | ICD-10-CM | POA: Diagnosis not present

## 2018-05-22 DIAGNOSIS — E876 Hypokalemia: Secondary | ICD-10-CM | POA: Diagnosis not present

## 2018-05-22 DIAGNOSIS — D631 Anemia in chronic kidney disease: Secondary | ICD-10-CM | POA: Diagnosis not present

## 2018-05-22 DIAGNOSIS — N186 End stage renal disease: Secondary | ICD-10-CM | POA: Diagnosis not present

## 2018-05-22 DIAGNOSIS — Z23 Encounter for immunization: Secondary | ICD-10-CM | POA: Diagnosis not present

## 2018-05-25 DIAGNOSIS — N2581 Secondary hyperparathyroidism of renal origin: Secondary | ICD-10-CM | POA: Diagnosis not present

## 2018-05-25 DIAGNOSIS — Z23 Encounter for immunization: Secondary | ICD-10-CM | POA: Diagnosis not present

## 2018-05-25 DIAGNOSIS — D631 Anemia in chronic kidney disease: Secondary | ICD-10-CM | POA: Diagnosis not present

## 2018-05-25 DIAGNOSIS — N186 End stage renal disease: Secondary | ICD-10-CM | POA: Diagnosis not present

## 2018-05-25 DIAGNOSIS — E876 Hypokalemia: Secondary | ICD-10-CM | POA: Diagnosis not present

## 2018-05-27 DIAGNOSIS — N186 End stage renal disease: Secondary | ICD-10-CM | POA: Diagnosis not present

## 2018-05-27 DIAGNOSIS — N2581 Secondary hyperparathyroidism of renal origin: Secondary | ICD-10-CM | POA: Diagnosis not present

## 2018-05-27 DIAGNOSIS — Z23 Encounter for immunization: Secondary | ICD-10-CM | POA: Diagnosis not present

## 2018-05-27 DIAGNOSIS — D631 Anemia in chronic kidney disease: Secondary | ICD-10-CM | POA: Diagnosis not present

## 2018-05-27 DIAGNOSIS — E876 Hypokalemia: Secondary | ICD-10-CM | POA: Diagnosis not present

## 2018-05-29 IMAGING — CR DG CHEST 2V
2 series · 2 of 2 positions shown · non-contrast
Comparison: 08/23/2017 chest radiograph.

CLINICAL DATA: Cough, left chest pain fever

EXAM:
CHEST  2 VIEW

[w chest pa]
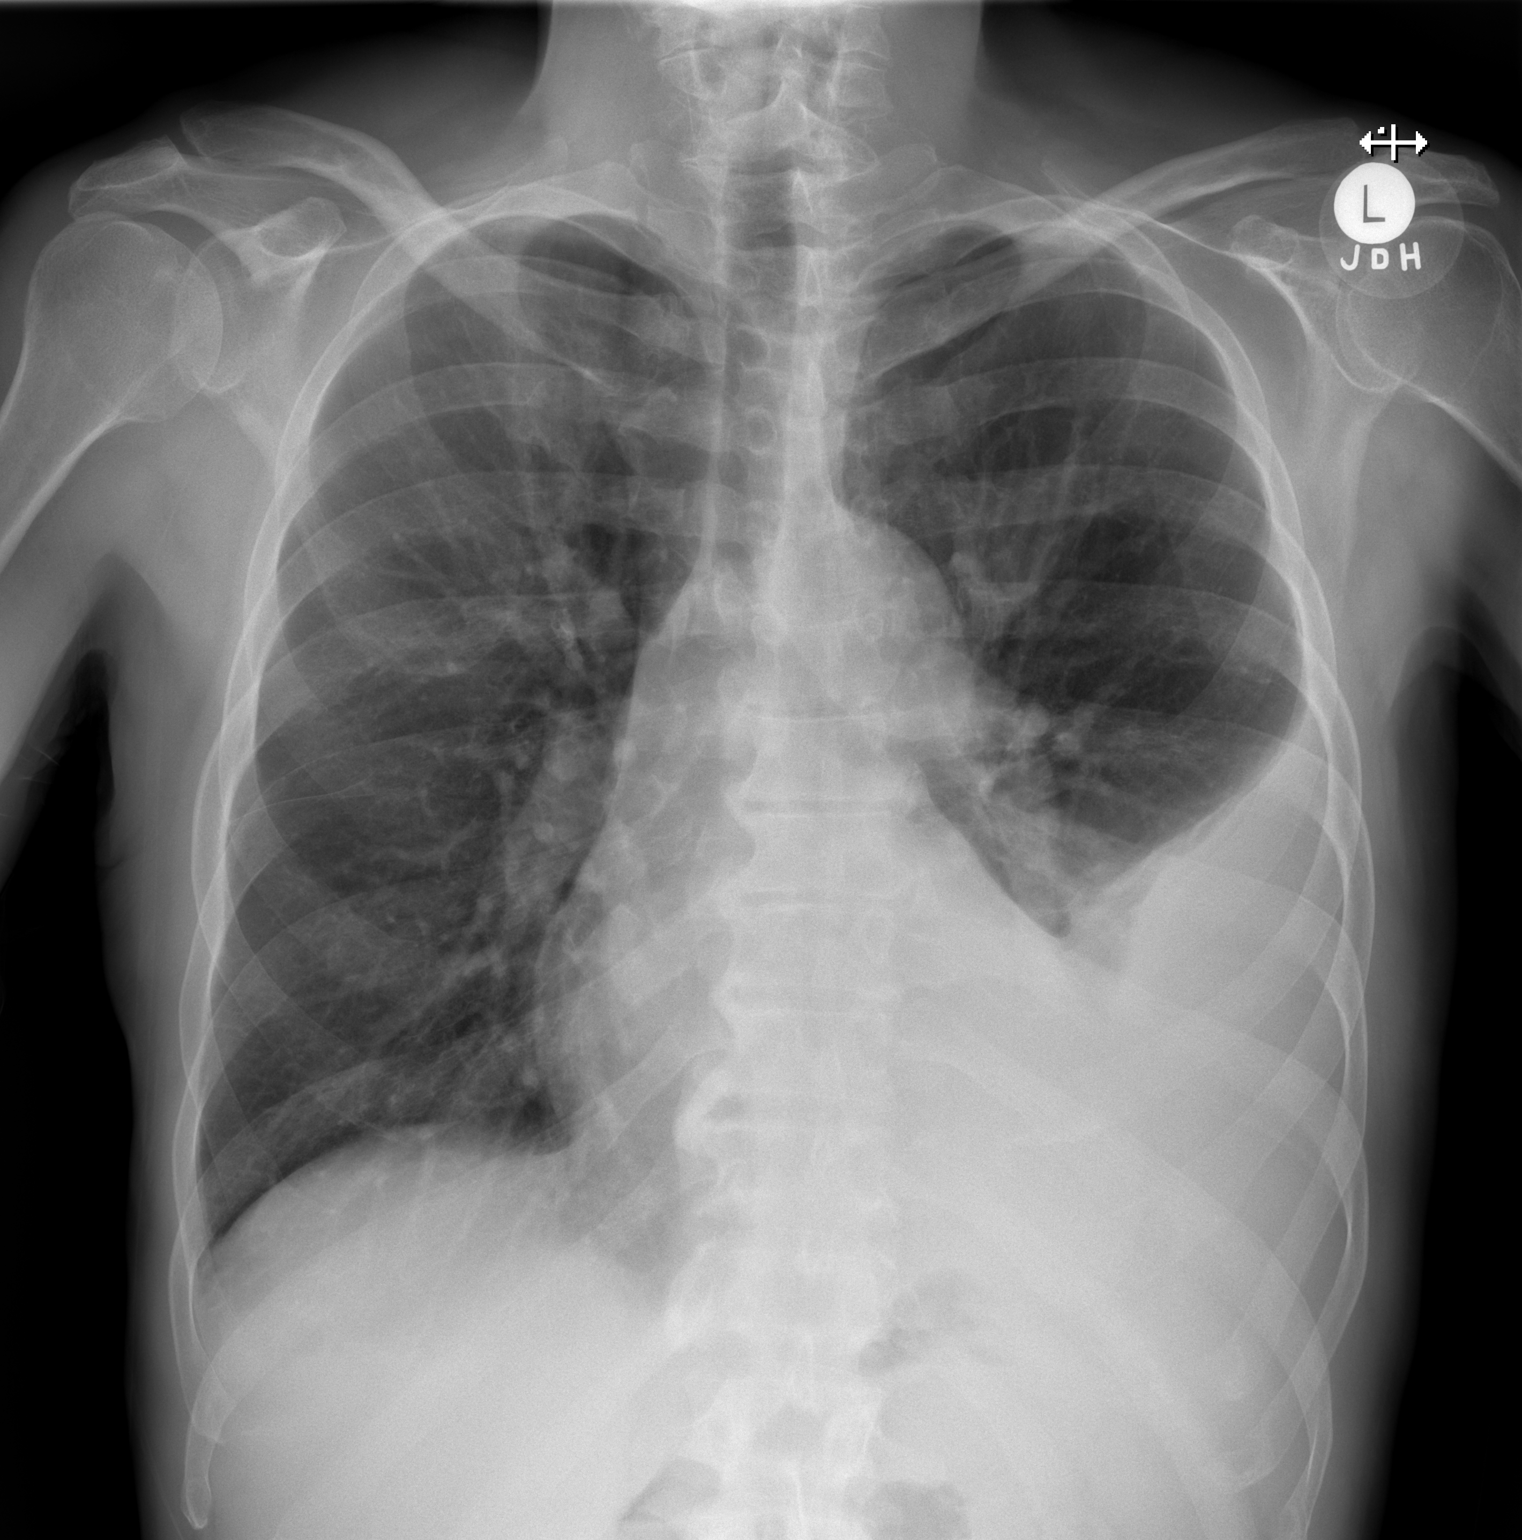

[w chest lat]
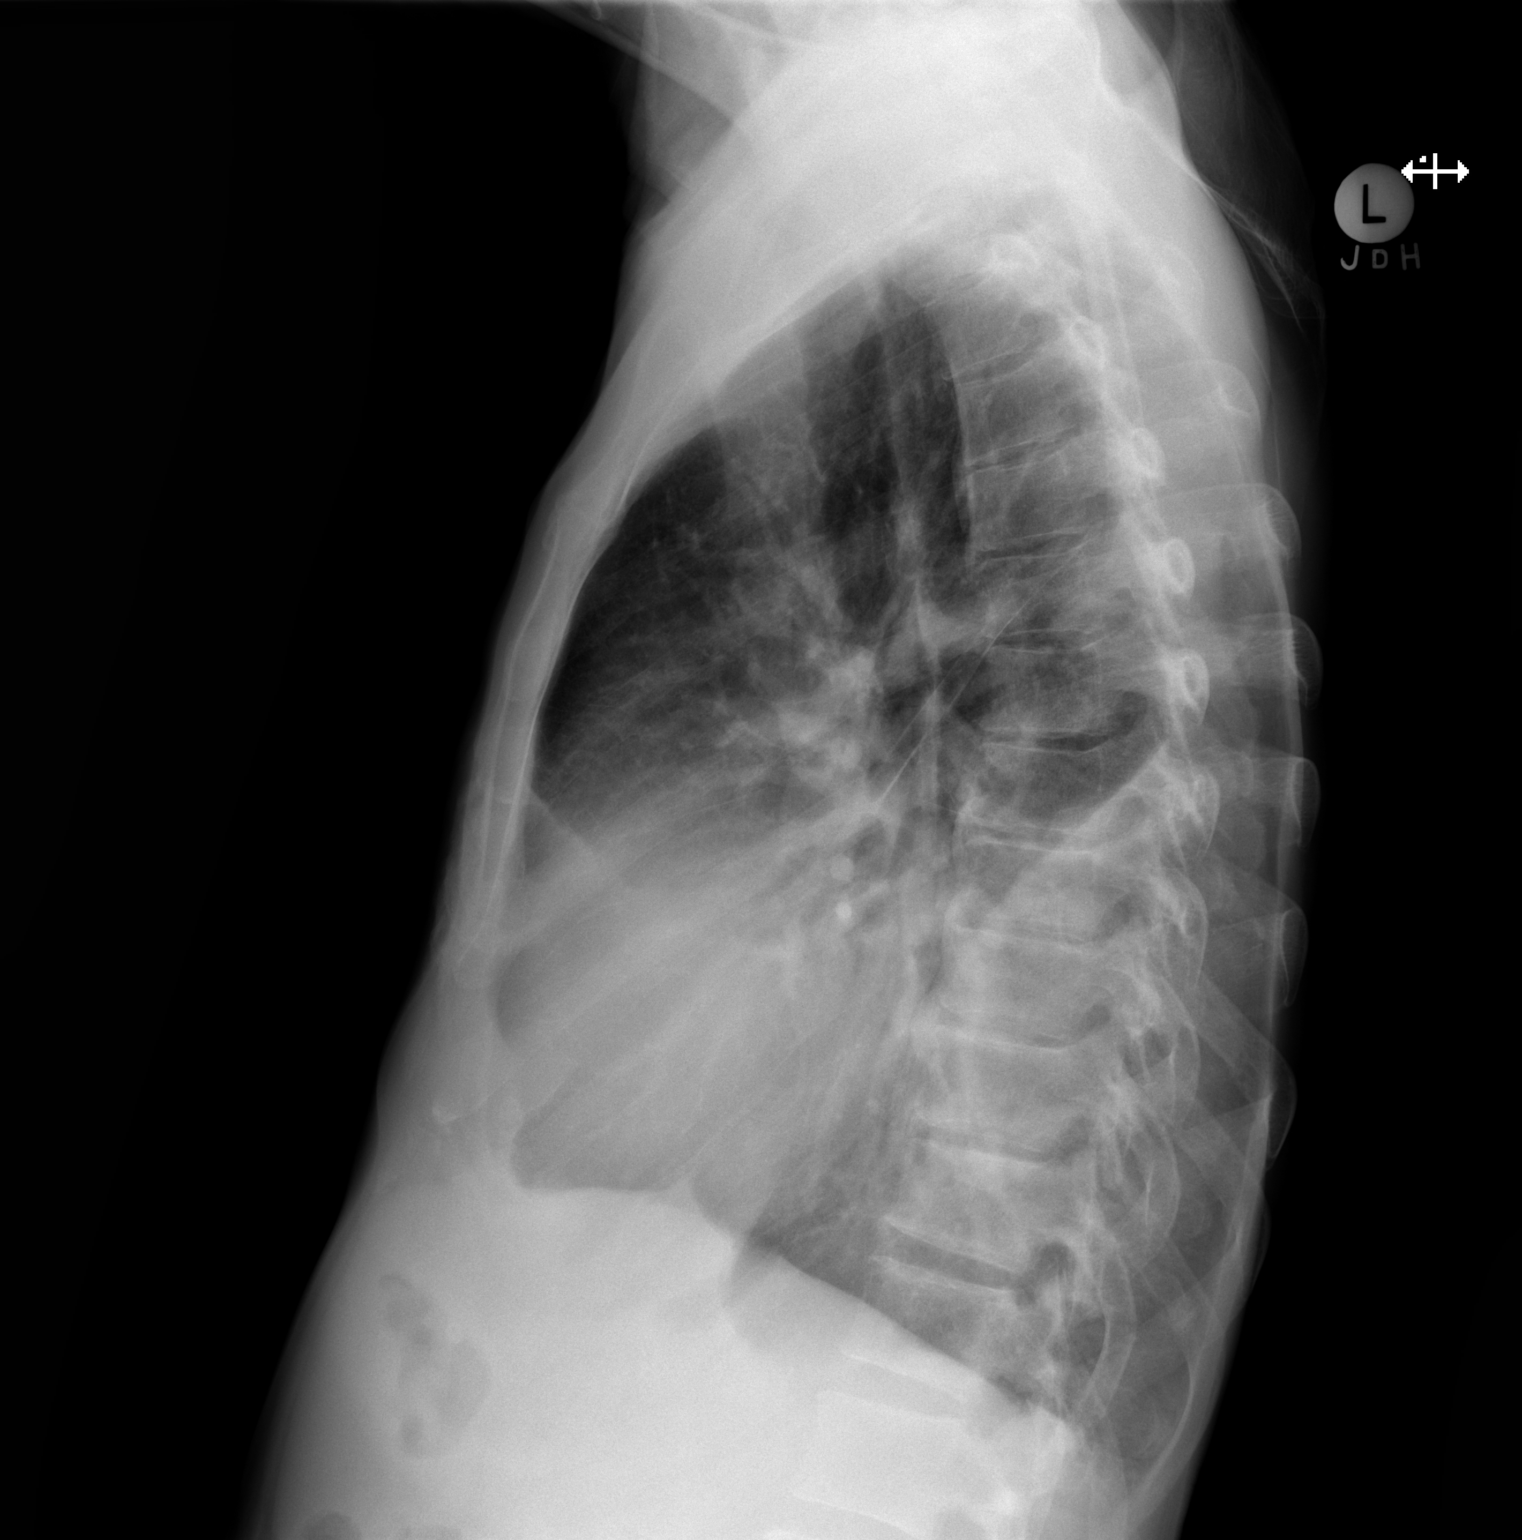

[2 of 2 positions shown; findings below may reference images not displayed]

FINDINGS: Stable cardiomediastinal silhouette with normal heart size. No
pneumothorax. New moderate left pleural effusion. No right pleural
effusion. No pulmonary edema. New patchy left lung base opacity.
IMPRESSION: 1. New moderate left pleural effusion.
2. New patchy left lung base opacity compatible with
atelectasis/pneumonia. Recommend follow-up chest radiographs to
resolution.

These results will be called to the ordering clinician or
representative by the [HOSPITAL] at the imaging location.

## 2018-05-31 DIAGNOSIS — I119 Hypertensive heart disease without heart failure: Secondary | ICD-10-CM | POA: Diagnosis not present

## 2018-05-31 DIAGNOSIS — Z992 Dependence on renal dialysis: Secondary | ICD-10-CM | POA: Diagnosis not present

## 2018-05-31 DIAGNOSIS — N186 End stage renal disease: Secondary | ICD-10-CM | POA: Diagnosis not present

## 2018-05-31 DIAGNOSIS — I130 Hypertensive heart and chronic kidney disease with heart failure and stage 1 through stage 4 chronic kidney disease, or unspecified chronic kidney disease: Secondary | ICD-10-CM | POA: Diagnosis not present

## 2018-06-01 DIAGNOSIS — N186 End stage renal disease: Secondary | ICD-10-CM | POA: Diagnosis not present

## 2018-06-01 DIAGNOSIS — Z992 Dependence on renal dialysis: Secondary | ICD-10-CM | POA: Diagnosis not present

## 2018-06-01 DIAGNOSIS — N2581 Secondary hyperparathyroidism of renal origin: Secondary | ICD-10-CM | POA: Diagnosis not present

## 2018-06-01 DIAGNOSIS — E876 Hypokalemia: Secondary | ICD-10-CM | POA: Diagnosis not present

## 2018-06-01 DIAGNOSIS — D631 Anemia in chronic kidney disease: Secondary | ICD-10-CM | POA: Diagnosis not present

## 2018-06-01 DIAGNOSIS — I10 Essential (primary) hypertension: Secondary | ICD-10-CM | POA: Diagnosis not present

## 2018-06-03 DIAGNOSIS — N2581 Secondary hyperparathyroidism of renal origin: Secondary | ICD-10-CM | POA: Diagnosis not present

## 2018-06-03 DIAGNOSIS — N186 End stage renal disease: Secondary | ICD-10-CM | POA: Diagnosis not present

## 2018-06-03 DIAGNOSIS — Z992 Dependence on renal dialysis: Secondary | ICD-10-CM | POA: Diagnosis not present

## 2018-06-03 DIAGNOSIS — E876 Hypokalemia: Secondary | ICD-10-CM | POA: Diagnosis not present

## 2018-06-03 DIAGNOSIS — D631 Anemia in chronic kidney disease: Secondary | ICD-10-CM | POA: Diagnosis not present

## 2018-06-05 DIAGNOSIS — Z992 Dependence on renal dialysis: Secondary | ICD-10-CM | POA: Diagnosis not present

## 2018-06-05 DIAGNOSIS — D631 Anemia in chronic kidney disease: Secondary | ICD-10-CM | POA: Diagnosis not present

## 2018-06-05 DIAGNOSIS — N2581 Secondary hyperparathyroidism of renal origin: Secondary | ICD-10-CM | POA: Diagnosis not present

## 2018-06-05 DIAGNOSIS — N186 End stage renal disease: Secondary | ICD-10-CM | POA: Diagnosis not present

## 2018-06-05 DIAGNOSIS — E876 Hypokalemia: Secondary | ICD-10-CM | POA: Diagnosis not present

## 2018-06-07 ENCOUNTER — Other Ambulatory Visit: Payer: Medicare Other | Admitting: *Deleted

## 2018-06-07 DIAGNOSIS — I5032 Chronic diastolic (congestive) heart failure: Secondary | ICD-10-CM | POA: Diagnosis not present

## 2018-06-07 LAB — BASIC METABOLIC PANEL
BUN / CREAT RATIO: 8 — AB (ref 9–20)
BUN: 79 mg/dL (ref 6–24)
CHLORIDE: 90 mmol/L — AB (ref 96–106)
CO2: 15 mmol/L — ABNORMAL LOW (ref 20–29)
Calcium: 9.7 mg/dL (ref 8.7–10.2)
Creatinine, Ser: 9.31 mg/dL — ABNORMAL HIGH (ref 0.76–1.27)
GFR calc non Af Amer: 6 mL/min/{1.73_m2} — ABNORMAL LOW (ref >59)
GFR, EST AFRICAN AMERICAN: 6 mL/min/{1.73_m2} — AB (ref >59)
Glucose: 152 mg/dL — ABNORMAL HIGH (ref 65–99)
Potassium: 5.1 mmol/L (ref 3.5–5.2)
SODIUM: 133 mmol/L — AB (ref 134–144)

## 2018-06-08 ENCOUNTER — Telehealth: Payer: Self-pay | Admitting: Cardiology

## 2018-06-08 NOTE — Telephone Encounter (Signed)
Reviewed pt's concerns with Dr Marlou Porch who gave reassurance the dye will be pulled off by dialysis.  Pt is aware.  His CT is scheduled for Monday 10/14 and he has dialysis on Tuesday.  Pt is also aware to take extra Metoprolol 50 mg this AM per Dr Marlou Porch,  as well.  Reviewed where to report for his CT as well.  All questions answered but I encouraged pt to c/b with any further questions/concerns.

## 2018-06-08 NOTE — Telephone Encounter (Signed)
New Message:    Pt is scheduled for a CT on 06-14-18. Pt said he have heard that the dye used for the test might have an effect on his kidney. He is very concerned, because he is already on Dialysis.

## 2018-06-10 DIAGNOSIS — N186 End stage renal disease: Secondary | ICD-10-CM | POA: Diagnosis not present

## 2018-06-10 DIAGNOSIS — E876 Hypokalemia: Secondary | ICD-10-CM | POA: Diagnosis not present

## 2018-06-10 DIAGNOSIS — Z992 Dependence on renal dialysis: Secondary | ICD-10-CM | POA: Diagnosis not present

## 2018-06-10 DIAGNOSIS — D631 Anemia in chronic kidney disease: Secondary | ICD-10-CM | POA: Diagnosis not present

## 2018-06-10 DIAGNOSIS — N2581 Secondary hyperparathyroidism of renal origin: Secondary | ICD-10-CM | POA: Diagnosis not present

## 2018-06-14 ENCOUNTER — Ambulatory Visit (HOSPITAL_COMMUNITY): Payer: Medicare Other

## 2018-06-14 ENCOUNTER — Ambulatory Visit (HOSPITAL_COMMUNITY)
Admission: RE | Admit: 2018-06-14 | Discharge: 2018-06-14 | Disposition: A | Discharge status: Home / Self Care | Payer: Medicare Other | Source: Ambulatory Visit | Attending: Cardiology | Admitting: Cardiology

## 2018-06-14 DIAGNOSIS — I712 Thoracic aortic aneurysm, without rupture: Secondary | ICD-10-CM | POA: Diagnosis not present

## 2018-06-14 DIAGNOSIS — R918 Other nonspecific abnormal finding of lung field: Secondary | ICD-10-CM | POA: Insufficient documentation

## 2018-06-14 DIAGNOSIS — I2510 Atherosclerotic heart disease of native coronary artery without angina pectoris: Secondary | ICD-10-CM | POA: Diagnosis not present

## 2018-06-14 DIAGNOSIS — J90 Pleural effusion, not elsewhere classified: Secondary | ICD-10-CM | POA: Insufficient documentation

## 2018-06-14 DIAGNOSIS — I517 Cardiomegaly: Secondary | ICD-10-CM | POA: Insufficient documentation

## 2018-06-14 DIAGNOSIS — I5032 Chronic diastolic (congestive) heart failure: Secondary | ICD-10-CM | POA: Diagnosis not present

## 2018-06-14 MED ORDER — IOPAMIDOL (ISOVUE-370) INJECTION 76%
INTRAVENOUS | Status: AC
  Filled 2018-06-14: qty 100

## 2018-06-14 MED ORDER — METOPROLOL TARTRATE 5 MG/5ML IV SOLN
10.0000 mg | INTRAVENOUS | Status: AC | PRN
  Administered 2018-06-14: 10 mg via INTRAVENOUS
  Filled 2018-06-14: qty 10

## 2018-06-14 MED ORDER — IOPAMIDOL (ISOVUE-370) INJECTION 76%
100.0000 mL | Freq: Once | INTRAVENOUS | Status: AC | PRN
  Administered 2018-06-14: 80 mL via INTRAVENOUS

## 2018-06-14 MED ORDER — NITROGLYCERIN 0.4 MG SL SUBL
0.8000 mg | SUBLINGUAL_TABLET | Freq: Once | SUBLINGUAL | Status: AC
Start: 2018-06-14 — End: 2018-06-14
  Administered 2018-06-14: 0.8 mg via SUBLINGUAL
  Filled 2018-06-14: qty 25

## 2018-06-14 MED ORDER — METOPROLOL TARTRATE 5 MG/5ML IV SOLN
INTRAVENOUS | Status: AC
  Filled 2018-06-14: qty 10

## 2018-06-14 MED ORDER — NITROGLYCERIN 0.4 MG SL SUBL
SUBLINGUAL_TABLET | SUBLINGUAL | Status: AC
  Filled 2018-06-14: qty 2

## 2018-06-15 DIAGNOSIS — Z992 Dependence on renal dialysis: Secondary | ICD-10-CM | POA: Diagnosis not present

## 2018-06-15 DIAGNOSIS — E876 Hypokalemia: Secondary | ICD-10-CM | POA: Diagnosis not present

## 2018-06-15 DIAGNOSIS — D631 Anemia in chronic kidney disease: Secondary | ICD-10-CM | POA: Diagnosis not present

## 2018-06-15 DIAGNOSIS — N2581 Secondary hyperparathyroidism of renal origin: Secondary | ICD-10-CM | POA: Diagnosis not present

## 2018-06-15 DIAGNOSIS — N186 End stage renal disease: Secondary | ICD-10-CM | POA: Diagnosis not present

## 2018-06-17 DIAGNOSIS — N2581 Secondary hyperparathyroidism of renal origin: Secondary | ICD-10-CM | POA: Diagnosis not present

## 2018-06-17 DIAGNOSIS — D631 Anemia in chronic kidney disease: Secondary | ICD-10-CM | POA: Diagnosis not present

## 2018-06-17 DIAGNOSIS — E876 Hypokalemia: Secondary | ICD-10-CM | POA: Diagnosis not present

## 2018-06-17 DIAGNOSIS — Z992 Dependence on renal dialysis: Secondary | ICD-10-CM | POA: Diagnosis not present

## 2018-06-17 DIAGNOSIS — N186 End stage renal disease: Secondary | ICD-10-CM | POA: Diagnosis not present

## 2018-06-22 DIAGNOSIS — N186 End stage renal disease: Secondary | ICD-10-CM | POA: Diagnosis not present

## 2018-06-22 DIAGNOSIS — E876 Hypokalemia: Secondary | ICD-10-CM | POA: Diagnosis not present

## 2018-06-22 DIAGNOSIS — N2581 Secondary hyperparathyroidism of renal origin: Secondary | ICD-10-CM | POA: Diagnosis not present

## 2018-06-22 DIAGNOSIS — D631 Anemia in chronic kidney disease: Secondary | ICD-10-CM | POA: Diagnosis not present

## 2018-06-22 DIAGNOSIS — Z992 Dependence on renal dialysis: Secondary | ICD-10-CM | POA: Diagnosis not present

## 2018-06-25 ENCOUNTER — Other Ambulatory Visit: Payer: Self-pay | Admitting: *Deleted

## 2018-06-25 DIAGNOSIS — I77810 Thoracic aortic ectasia: Secondary | ICD-10-CM

## 2018-06-25 DIAGNOSIS — I5032 Chronic diastolic (congestive) heart failure: Secondary | ICD-10-CM

## 2018-06-26 DIAGNOSIS — N2581 Secondary hyperparathyroidism of renal origin: Secondary | ICD-10-CM | POA: Diagnosis not present

## 2018-06-26 DIAGNOSIS — Z992 Dependence on renal dialysis: Secondary | ICD-10-CM | POA: Diagnosis not present

## 2018-06-26 DIAGNOSIS — N186 End stage renal disease: Secondary | ICD-10-CM | POA: Diagnosis not present

## 2018-06-26 DIAGNOSIS — D631 Anemia in chronic kidney disease: Secondary | ICD-10-CM | POA: Diagnosis not present

## 2018-06-26 DIAGNOSIS — E876 Hypokalemia: Secondary | ICD-10-CM | POA: Diagnosis not present

## 2018-06-26 IMAGING — CR DG CHEST 2V
2 series · 2 of 2 positions shown · non-contrast
Comparison: 09/23/2017

CLINICAL DATA: History of left pleural effusion, follow-up exam

EXAM:
CHEST  2 VIEW

[w chest pa]
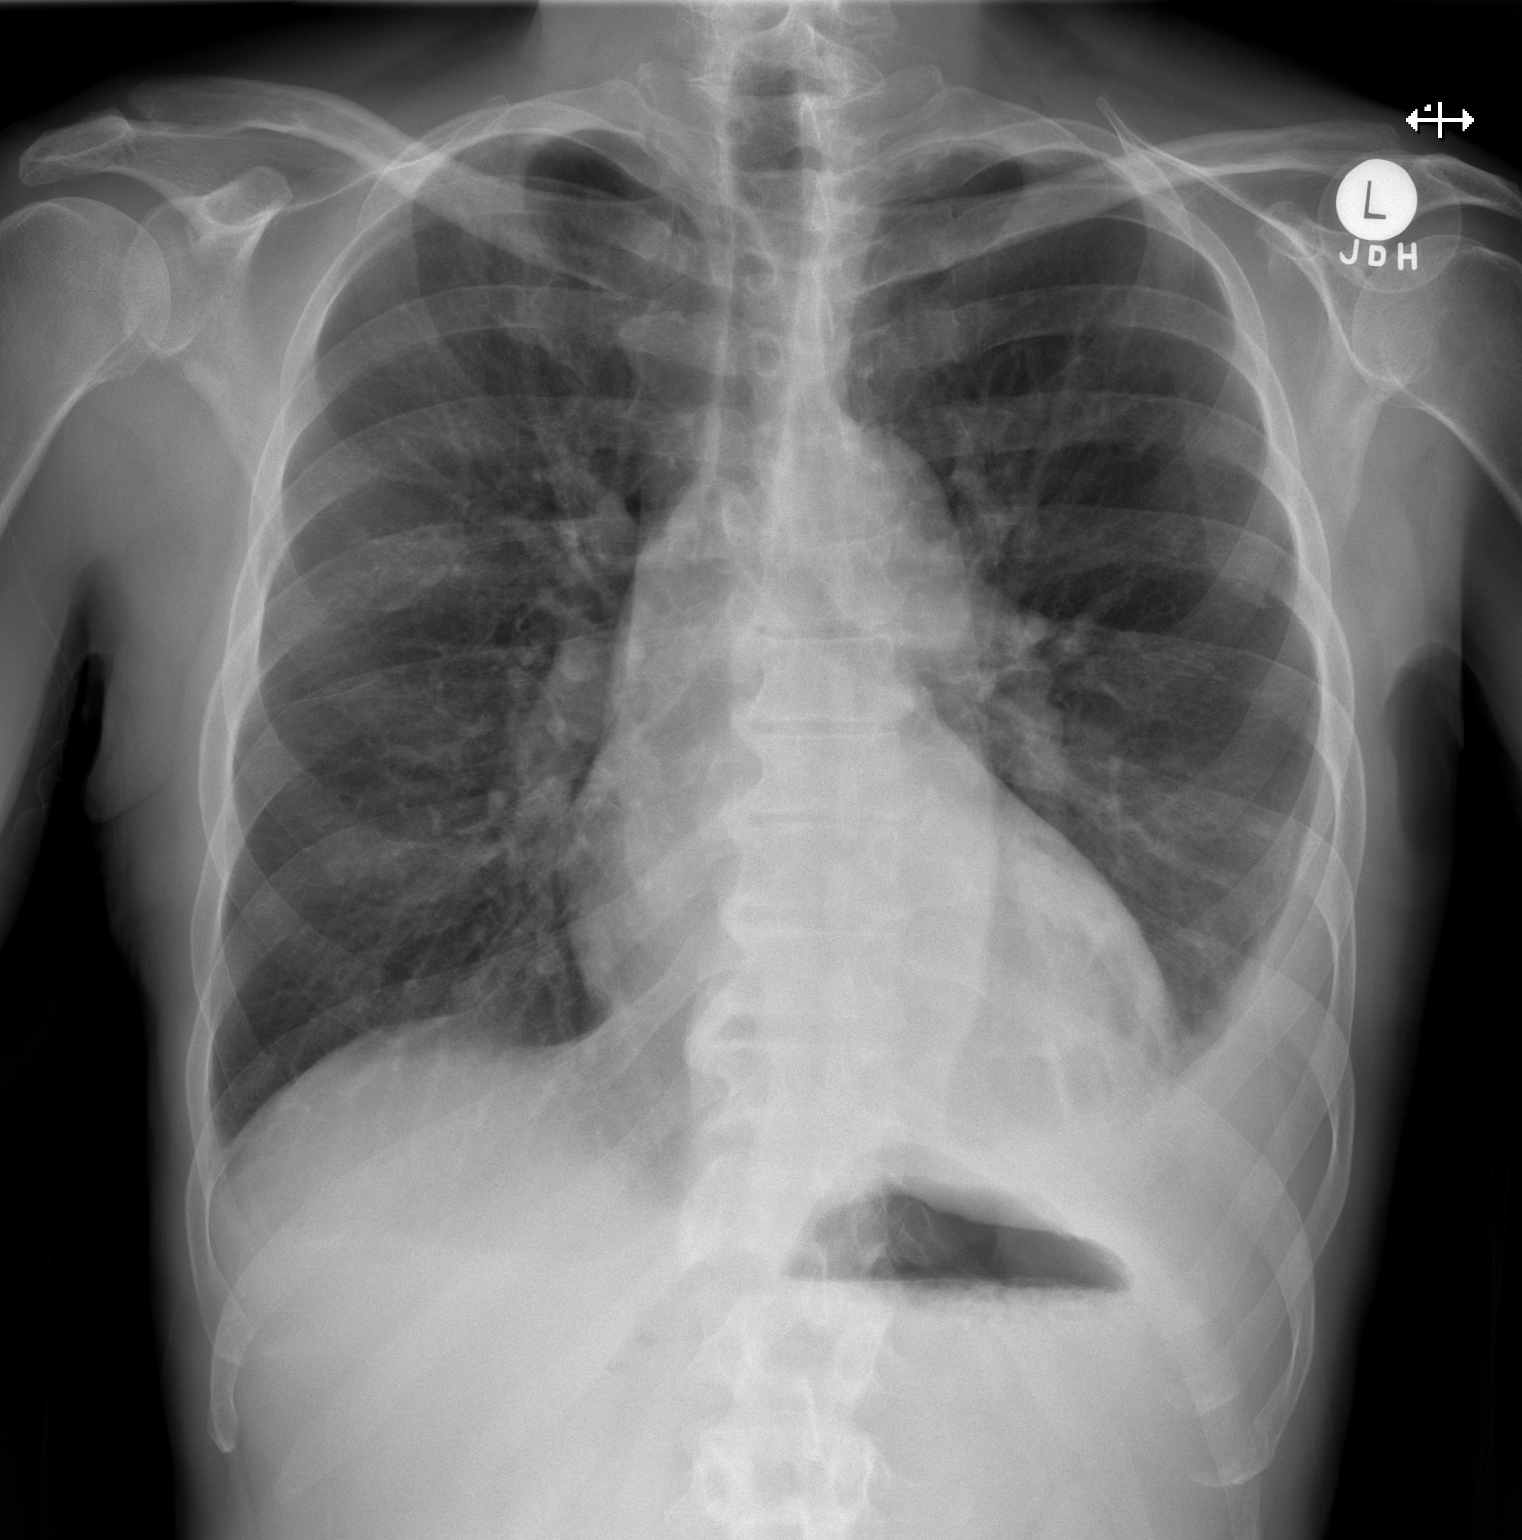

[w chest lat]
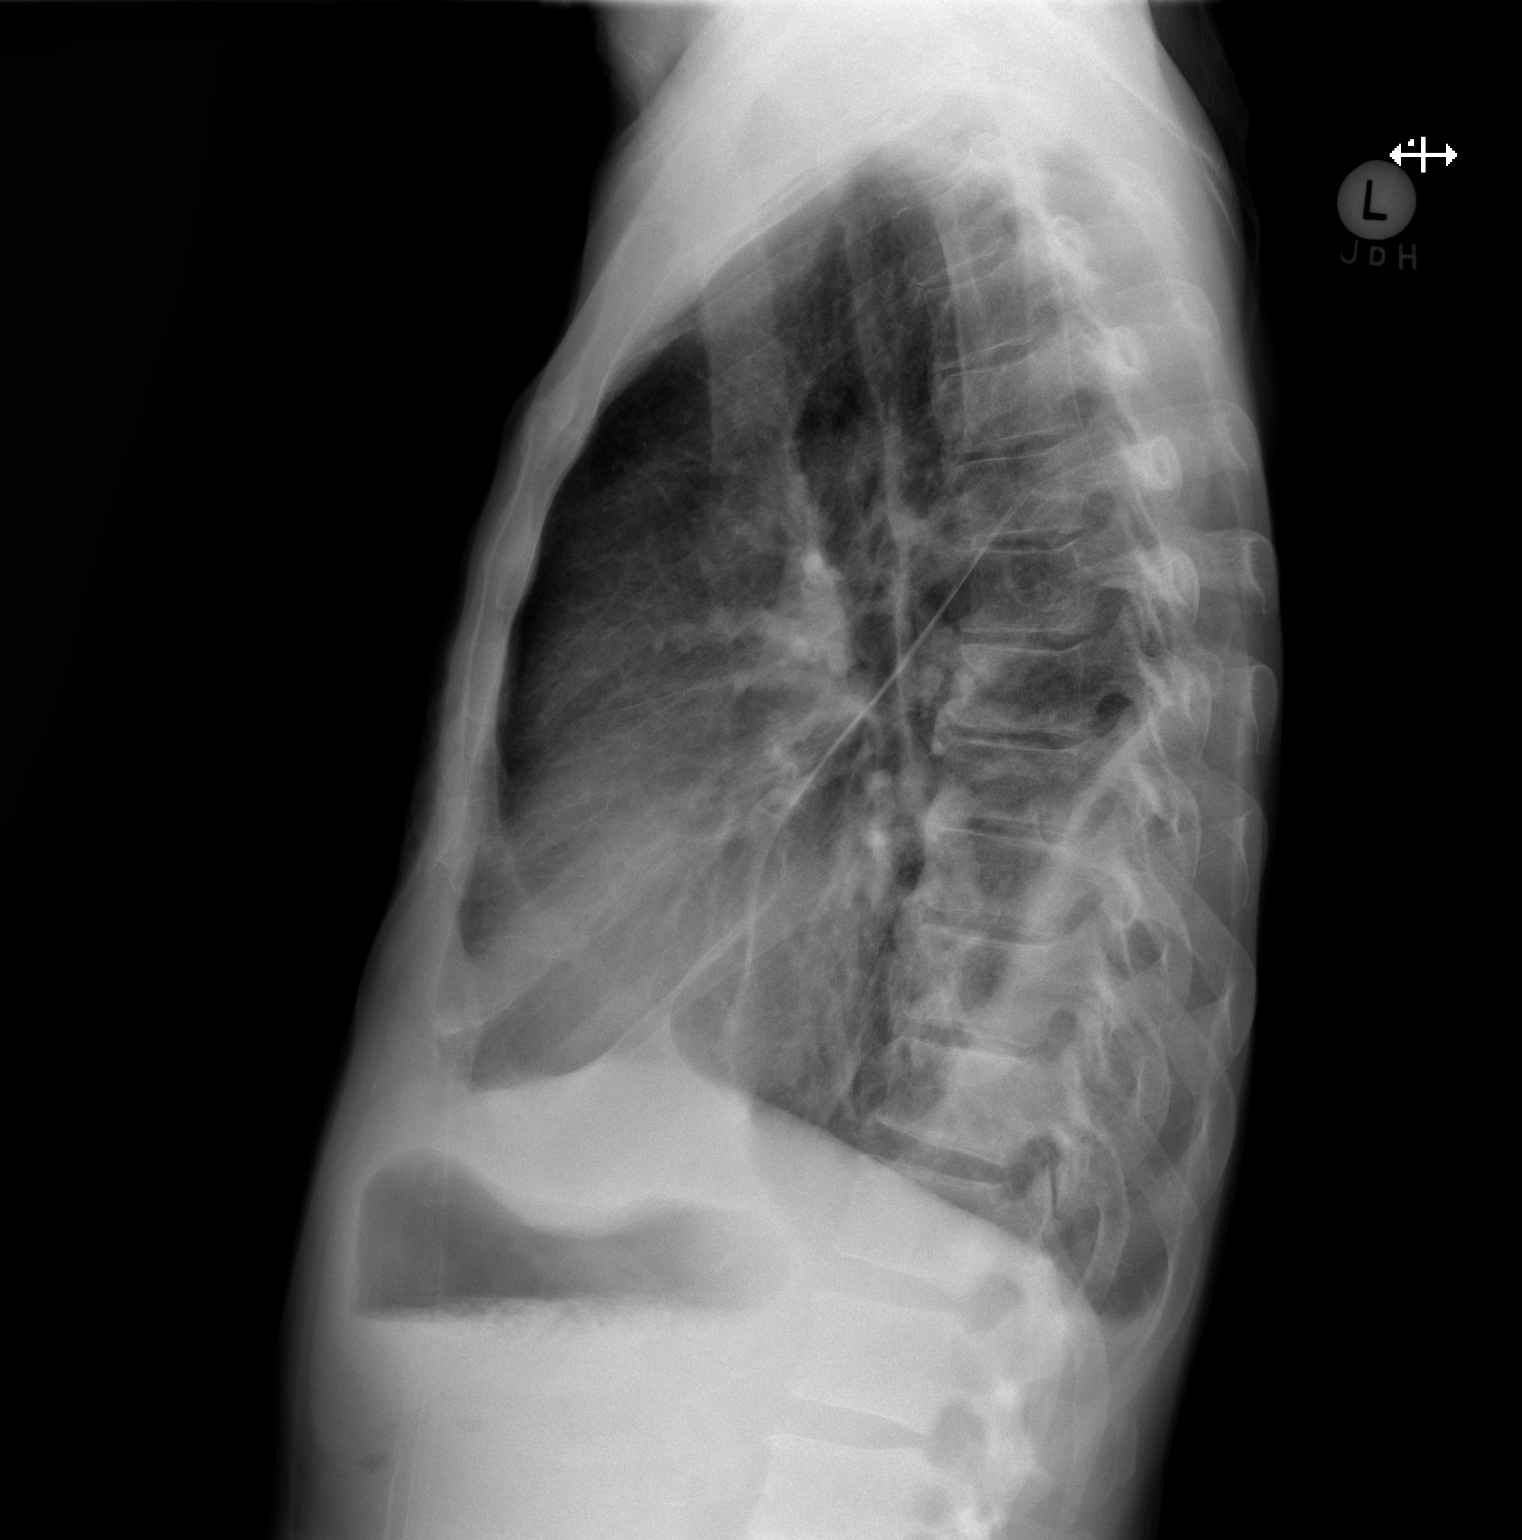

[2 of 2 positions shown; findings below may reference images not displayed]

FINDINGS: Cardiac shadow is mildly enlarged but stable. The right lung remains
clear. Left pleura effusion is noted although decreased from the
prior exam. Improved aeration in the left lower lobe is noted. No
bony abnormality is seen.
IMPRESSION: Improved left pleural effusion.
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
## 2018-06-29 DIAGNOSIS — N186 End stage renal disease: Secondary | ICD-10-CM | POA: Diagnosis not present

## 2018-06-29 DIAGNOSIS — F5101 Primary insomnia: Secondary | ICD-10-CM | POA: Diagnosis not present

## 2018-06-29 DIAGNOSIS — G2581 Restless legs syndrome: Secondary | ICD-10-CM | POA: Diagnosis not present

## 2018-06-29 DIAGNOSIS — K219 Gastro-esophageal reflux disease without esophagitis: Secondary | ICD-10-CM | POA: Diagnosis not present

## 2018-06-29 DIAGNOSIS — I2510 Atherosclerotic heart disease of native coronary artery without angina pectoris: Secondary | ICD-10-CM | POA: Diagnosis not present

## 2018-06-29 DIAGNOSIS — Z992 Dependence on renal dialysis: Secondary | ICD-10-CM | POA: Diagnosis not present

## 2018-06-29 DIAGNOSIS — I5042 Chronic combined systolic (congestive) and diastolic (congestive) heart failure: Secondary | ICD-10-CM | POA: Diagnosis not present

## 2018-06-29 DIAGNOSIS — I119 Hypertensive heart disease without heart failure: Secondary | ICD-10-CM | POA: Diagnosis not present

## 2018-07-01 DIAGNOSIS — N186 End stage renal disease: Secondary | ICD-10-CM | POA: Diagnosis not present

## 2018-07-01 DIAGNOSIS — Z992 Dependence on renal dialysis: Secondary | ICD-10-CM | POA: Diagnosis not present

## 2018-07-01 DIAGNOSIS — E876 Hypokalemia: Secondary | ICD-10-CM | POA: Diagnosis not present

## 2018-07-01 DIAGNOSIS — N2581 Secondary hyperparathyroidism of renal origin: Secondary | ICD-10-CM | POA: Diagnosis not present

## 2018-07-01 DIAGNOSIS — D631 Anemia in chronic kidney disease: Secondary | ICD-10-CM | POA: Diagnosis not present

## 2018-07-02 DIAGNOSIS — Z992 Dependence on renal dialysis: Secondary | ICD-10-CM | POA: Diagnosis not present

## 2018-07-02 DIAGNOSIS — N186 End stage renal disease: Secondary | ICD-10-CM | POA: Diagnosis not present

## 2018-07-02 DIAGNOSIS — I10 Essential (primary) hypertension: Secondary | ICD-10-CM | POA: Diagnosis not present

## 2018-07-06 ENCOUNTER — Emergency Department (HOSPITAL_COMMUNITY)
Admission: EM | Admit: 2018-07-06 | Discharge: 2018-07-06 | Disposition: A | Discharge status: Home / Self Care | Payer: Medicare Other | Attending: Emergency Medicine | Admitting: Emergency Medicine

## 2018-07-06 ENCOUNTER — Other Ambulatory Visit: Payer: Self-pay

## 2018-07-06 ENCOUNTER — Encounter (HOSPITAL_COMMUNITY): Payer: Self-pay

## 2018-07-06 ENCOUNTER — Emergency Department (HOSPITAL_COMMUNITY): Payer: Medicare Other

## 2018-07-06 DIAGNOSIS — E785 Hyperlipidemia, unspecified: Secondary | ICD-10-CM | POA: Diagnosis not present

## 2018-07-06 DIAGNOSIS — R531 Weakness: Secondary | ICD-10-CM | POA: Insufficient documentation

## 2018-07-06 DIAGNOSIS — R0602 Shortness of breath: Secondary | ICD-10-CM | POA: Diagnosis not present

## 2018-07-06 DIAGNOSIS — Z7982 Long term (current) use of aspirin: Secondary | ICD-10-CM | POA: Diagnosis not present

## 2018-07-06 DIAGNOSIS — J45909 Unspecified asthma, uncomplicated: Secondary | ICD-10-CM | POA: Insufficient documentation

## 2018-07-06 DIAGNOSIS — R4020 Unspecified coma: Secondary | ICD-10-CM | POA: Diagnosis not present

## 2018-07-06 DIAGNOSIS — R5383 Other fatigue: Secondary | ICD-10-CM | POA: Diagnosis not present

## 2018-07-06 DIAGNOSIS — I132 Hypertensive heart and chronic kidney disease with heart failure and with stage 5 chronic kidney disease, or end stage renal disease: Secondary | ICD-10-CM | POA: Insufficient documentation

## 2018-07-06 DIAGNOSIS — I5032 Chronic diastolic (congestive) heart failure: Secondary | ICD-10-CM | POA: Diagnosis not present

## 2018-07-06 DIAGNOSIS — N186 End stage renal disease: Secondary | ICD-10-CM | POA: Diagnosis not present

## 2018-07-06 DIAGNOSIS — Z992 Dependence on renal dialysis: Secondary | ICD-10-CM | POA: Insufficient documentation

## 2018-07-06 DIAGNOSIS — E876 Hypokalemia: Secondary | ICD-10-CM | POA: Diagnosis not present

## 2018-07-06 DIAGNOSIS — Z8673 Personal history of transient ischemic attack (TIA), and cerebral infarction without residual deficits: Secondary | ICD-10-CM | POA: Diagnosis not present

## 2018-07-06 DIAGNOSIS — Z79899 Other long term (current) drug therapy: Secondary | ICD-10-CM | POA: Insufficient documentation

## 2018-07-06 DIAGNOSIS — R000 Tachycardia, unspecified: Secondary | ICD-10-CM | POA: Diagnosis not present

## 2018-07-06 DIAGNOSIS — I499 Cardiac arrhythmia, unspecified: Secondary | ICD-10-CM | POA: Diagnosis not present

## 2018-07-06 DIAGNOSIS — R0902 Hypoxemia: Secondary | ICD-10-CM | POA: Diagnosis not present

## 2018-07-06 DIAGNOSIS — R4182 Altered mental status, unspecified: Secondary | ICD-10-CM | POA: Diagnosis not present

## 2018-07-06 DIAGNOSIS — N2581 Secondary hyperparathyroidism of renal origin: Secondary | ICD-10-CM | POA: Diagnosis not present

## 2018-07-06 DIAGNOSIS — D631 Anemia in chronic kidney disease: Secondary | ICD-10-CM | POA: Diagnosis not present

## 2018-07-06 LAB — COMPREHENSIVE METABOLIC PANEL
ALBUMIN: 3.5 g/dL (ref 3.5–5.0)
ALK PHOS: 67 U/L (ref 38–126)
ALT: 13 U/L (ref 0–44)
AST: 9 U/L — ABNORMAL LOW (ref 15–41)
Anion gap: 11 (ref 5–15)
BILIRUBIN TOTAL: 0.9 mg/dL (ref 0.3–1.2)
BUN: 34 mg/dL — ABNORMAL HIGH (ref 6–20)
CALCIUM: 8.7 mg/dL — AB (ref 8.9–10.3)
CO2: 25 mmol/L (ref 22–32)
Chloride: 100 mmol/L (ref 98–111)
Creatinine, Ser: 5.71 mg/dL — ABNORMAL HIGH (ref 0.61–1.24)
GFR calc non Af Amer: 10 mL/min — ABNORMAL LOW (ref >60)
GFR, EST AFRICAN AMERICAN: 11 mL/min — AB (ref >60)
Glucose, Bld: 107 mg/dL — ABNORMAL HIGH (ref 70–99)
POTASSIUM: 3.8 mmol/L (ref 3.5–5.1)
SODIUM: 136 mmol/L (ref 135–145)
TOTAL PROTEIN: 6.6 g/dL (ref 6.5–8.1)

## 2018-07-06 LAB — CBC
HEMATOCRIT: 27.5 % — AB (ref 39.0–52.0)
HEMOGLOBIN: 8.6 g/dL — AB (ref 13.0–17.0)
MCH: 33.5 pg (ref 26.0–34.0)
MCHC: 31.3 g/dL (ref 30.0–36.0)
MCV: 107 fL — ABNORMAL HIGH (ref 80.0–100.0)
NRBC: 0 % (ref 0.0–0.2)
Platelets: 113 10*3/uL — ABNORMAL LOW (ref 150–400)
RBC: 2.57 MIL/uL — AB (ref 4.22–5.81)
RDW: 17.2 % — AB (ref 11.5–15.5)
WBC: 4.7 10*3/uL (ref 4.0–10.5)

## 2018-07-06 LAB — AMMONIA: Ammonia: 23 umol/L (ref 9–35)

## 2018-07-06 LAB — PROTIME-INR
INR: 1.14
Prothrombin Time: 14.5 seconds (ref 11.4–15.2)

## 2018-07-06 LAB — APTT: APTT: 35 s (ref 24–36)

## 2018-07-06 LAB — ETHANOL

## 2018-07-06 NOTE — Discharge Instructions (Addendum)
Continue your current medications, follow up with your primary care doctor, return as needed for worsening symptoms

## 2018-07-06 NOTE — ED Triage Notes (Signed)
Pt arrives to ED from dialysis center after receiving full treatment with complaints of generalized drowsiness and fatigue reported by staff since this morning during treatment. EMS reports pt missed dialysis last week on Saturday, is normally T,TH,Sat treatment. Pt is 14kg above his dry weight after treatment today. Alert and oriented upon arrival, does appear drowsy. Pt placed in position of comfort with bed locked and lowered, call bell in reach.

## 2018-07-06 NOTE — ED Provider Notes (Signed)
[facility] EMERGENCY DEPARTMENT Provider Note   CSN: [id number] Arrival date & time: 07/06/18  1143     History   Chief Complaint Chief Complaint  Patient presents with  . Weakness  . Irregular Heart Beat    HPI Thomas Robinson is a 57 y.o. male.  HPI Pt states he went felt fine initially this am.  He missed dialysis on Saturday but He went to dialysis today.  While he was receiving his treatment he started to feel drowsy and weak.  He felt shaky and weak all over and very tired. Thomas Robinson  He denies trouble with his speech or vision.   Mild HA.  No fevers or chills. Pt denies drug or alcohol use.   Past Medical History:  Diagnosis Date  . Acute kidney injury superimposed on CKD ([place]) 05/24/2017  . Acute on chronic diastolic CHF (congestive heart failure) ([place]) 04/13/2018  . AKI (acute kidney injury) ([place]) 04/10/2015  . Asthma   . Asthma, chronic, unspecified asthma severity, with acute exacerbation 10/23/2017  . Atypical chest pain 02/16/2018  . CAP (community acquired pneumonia) 10/23/2017  . Cerebral infarction due to thrombosis of cerebral artery ([place])   . Cerebrovascular accident (CVA) ([place])   . Chronic kidney disease   . CKD (chronic kidney disease), stage V ([place]) 05/24/2017  . ESRD (end stage renal disease) on dialysis ([place]) 02/16/2018  . Essential hypertension 04/09/2015  . GI bleeding 05/24/2017  . History of completed stroke   . Hyperlipidemia   . Hypertension   . Hypertensive urgency 05/24/2017  . Kidney failure   . Respiratory failure, acute ([place]) 10/23/2017  . Restless leg syndrome   . Stroke ([place])   . Symptomatic anemia 05/24/2017    Patient Active Problem List   Diagnosis Date Noted  . Chest pain 04/13/2018  . Acute on chronic diastolic CHF (congestive heart failure) ([place]) 04/13/2018  . Chest pressure   . Atypical chest pain 02/16/2018  . ESRD on dialysis ([place]) 02/16/2018  . Volume overload 10/23/2017  . Cough 08/23/2017  . Constipation   .  Macrocytic anemia 05/24/2017  . Hypertensive urgency 05/24/2017  . History of completed stroke   . Essential hypertension 04/09/2015    Past Surgical History:  Procedure Laterality Date  . AV FISTULA PLACEMENT Left 05/28/2017   Procedure: LEFT ARM ARTERIOVENOUS (AV) FISTULA CREATION;  Surgeon: Conrad Kapaau, MD;  Location: [facility];  Service: Vascular;  Laterality: Left;  . IR FLUORO GUIDE CV LINE RIGHT  05/25/2017  . IR US GUIDE VASC ACCESS RIGHT  05/25/2017  . RINOPLASTY          Home Medications    Prior to Admission medications   Medication Sig Start Date End Date Taking? Authorizing Provider  albuterol (PROVENTIL HFA;VENTOLIN HFA) 108 (90 BASE) MCG/ACT inhaler Inhale 1-2 puffs into the lungs every 6 (six) hours as needed for wheezing or shortness of breath. 04/12/15   Thomas Redwood, MD  amLODipine (NORVASC) 10 MG tablet Take 1 tablet (10 mg total) by mouth daily. 04/16/18   Thomas Allan I, MD  aspirin EC 81 MG EC tablet Take 1 tablet (81 mg total) by mouth daily. 04/16/18   Thomas Public, MD  budesonide-formoterol (SYMBICORT) 80-4.5 MCG/ACT inhaler Inhale 2 puffs into the lungs 2 (two) times daily. 04/15/18   Thomas Public, MD  carvedilol (COREG) 12.5 MG tablet Take 1 tablet (12.5 mg total) by mouth 2 (two) times daily with a meal. 04/15/18  Thomas Allan I, MD  cinacalcet (SENSIPAR) 30 MG tablet Take 30 mg by mouth daily with supper. Do not take less than 12 hours prior to dialysis 01/25/18   [provider]  clonazePAM (KLONOPIN) 0.5 MG tablet Take 0.5 mg by mouth at bedtime.     [provider]  gabapentin (NEURONTIN) 100 MG capsule Take 100 mg by mouth at bedtime.     [provider]  hydrALAZINE (APRESOLINE) 100 MG tablet Take 1 tablet (100 mg total) by mouth every 8 (eight) hours. 04/15/18   Thomas Public, MD  metoCLOPramide (REGLAN) 5 MG tablet Take 5 mg by mouth every 8 (eight) hours as needed for nausea.  03/29/18   [provider]  montelukast (SINGULAIR) 10 MG tablet Take 1 tablet (10 mg total) by mouth at bedtime. 04/15/18   Thomas Allan I, MD  multivitamin (RENA-VIT) TABS tablet Take 1 tablet by mouth daily. 01/13/18   [provider]  pantoprazole (PROTONIX) 40 MG tablet Take 40 mg by mouth daily. 03/29/18   [provider]  polyethylene glycol (MIRALAX / GLYCOLAX) packet Take 17 g by mouth daily as needed. Patient taking differently: Take 17 g by mouth daily as needed for mild constipation.  06/02/17   Thomas Fire, MD  predniSONE (DELTASONE) 10 MG tablet Prednisone 60 mg daily for 3 days, then 40 mg po daily for 3 days, then 30 mg po daily for 3 days, then 20 mg po daily for 3 days and then 10 mg po daily for 3 days. 04/15/18   Thomas Public, MD  rOPINIRole (REQUIP) 0.5 MG tablet Take 0.5 mg by mouth 2 (two) times daily. 01/26/18   [provider]  sevelamer (RENAGEL) 800 MG tablet Take 800-1,600 mg by mouth See admin instructions. 1,600mg  by mouth three times daily and 800mg  twice daily with snacks 01/25/18   [provider]    Family History Family History  Problem Relation Age of Onset  . Cancer Mother     Social History Social History   Tobacco Use  . Smoking status: Never Smoker  . Smokeless tobacco: Never Used  Substance Use Topics  . Alcohol use: No    Alcohol/week: 0.0 standard drinks    Comment: has a drink once in a while- 07/2018  . Drug use: No     Allergies   Patient has no known allergies.   Review of Systems Review of Systems  All other systems reviewed and are negative.    Physical Exam Updated Vital Signs BP (!) 152/78   Pulse 67   Temp 98.1 F (36.7 C) (Oral)   Resp 18   Ht 1.702 m (5\' 7" )   Wt 67.6 kg   SpO2 (!) 85%   BMI 23.34 kg/m   Physical Exam  Constitutional: He is oriented to person, place, and time. He appears well-developed and well-nourished. He appears listless. No distress.  HENT:  Head:  Normocephalic and atraumatic.  Right Ear: External ear normal.  Left Ear: External ear normal.  Mouth/Throat: Oropharynx is clear and moist.  Eyes: Conjunctivae are normal. Right eye exhibits no discharge. Left eye exhibits no discharge. No scleral icterus.  Neck: Neck supple. No tracheal deviation present.  Cardiovascular: Normal rate, regular rhythm and intact distal pulses.  Pulmonary/Chest: Effort normal and breath sounds normal. No stridor. No respiratory distress. He has no wheezes. He has no rales.  Abdominal: Soft. Bowel sounds are normal. He exhibits no distension. There is no  tenderness. There is no rebound and no guarding.  Musculoskeletal: He exhibits no edema or tenderness.  Neurological: He is oriented to person, place, and time. He appears listless. No cranial nerve deficit (No facial droop, extraocular movements intact, tongue midline ) or sensory deficit. He exhibits normal muscle tone. He displays no seizure activity. Coordination normal. GCS eye subscore is 4. GCS verbal subscore is 5. GCS motor subscore is 6.  No pronator drift bilateral upper extrem, able to hold both legs off bed for 5 seconds, sensation intact in all extremities, no visual field cuts, no left or right sided neglect, some pass pointing finger-nose exam bilaterally, no nystagmus noted  Speech and movements are slow   Skin: Skin is warm and dry. No rash noted.  Psychiatric: He has a normal mood and affect.  Nursing note and vitals reviewed.    ED Treatments / Results  Labs (all labs ordered are listed, but only abnormal results are displayed) Labs Reviewed  CBC - Abnormal; Notable for the following components:      Result Value   RBC 2.57 (*)    Hemoglobin 8.6 (*)    HCT 27.5 (*)    MCV 107.0 (*)    RDW 17.2 (*)    Platelets 113 (*)    All other components within normal limits  COMPREHENSIVE METABOLIC PANEL - Abnormal; Notable for the following components:   Glucose, Bld 107 (*)    BUN 34 (*)      Creatinine, Ser 5.71 (*)    Calcium 8.7 (*)    AST 9 (*)    GFR calc non Af Amer 10 (*)    GFR calc Af Amer 11 (*)    All other components within normal limits  PROTIME-INR  APTT  AMMONIA  ETHANOL    EKG EKG Interpretation  Date/Time:  Tuesday July 06 2018 11:46:34 EST Ventricular Rate:  69 PR Interval:    QRS Duration: 95 QT Interval:  436 QTC Calculation: 468 R Axis:   64 Text Interpretation:  Ectopic atrial rhythm LVH with secondary repolarization abnormality ectopic atrial rhythm is new since last tracing Confirmed by Dorie Rank [phone]) on 07/06/2018 11:51:50 AM   Radiology Ct Head Wo Contrast  Result Date: 07/06/2018 CLINICAL DATA:  Drowsiness and fatigue with altered level of consciousness after dialysis. EXAM: CT HEAD WITHOUT CONTRAST TECHNIQUE: Contiguous axial images were obtained from the base of the skull through the vertex without intravenous contrast. COMPARISON:  Multiple exams, including 04/09/2015 FINDINGS: Brain: Small remote infarcts in the right thalamus and right periventricular white matter. There is potentially a small remote left anterior thalamic lacunar infarct. Periventricular white matter and corona radiata hypodensities favor chronic ischemic microvascular white matter disease. Otherwise, the brainstem, cerebellum, cerebral peduncles, thalami, basal ganglia, basilar cisterns, and ventricular system appear within normal limits. No intracranial hemorrhage, mass lesion, or acute CVA. Vascular: There is atherosclerotic calcification of the cavernous carotid arteries bilaterally. Skull: Unremarkable Sinuses/Orbits: Mild chronic bilateral maxillary sinusitis. Other: No supplemental non-categorized findings. IMPRESSION: 1. No acute intracranial findings. 2. Small remote thalamic and right periventricular white matter lacunar infarcts. 3. Periventricular white matter and corona radiata hypodensities favor chronic ischemic microvascular white matter disease. 4.  Mild chronic bilateral maxillary sinusitis. Electronically Signed   By: Van Clines M.D.   On: 07/06/2018 13:29    Procedures Procedures (including critical care time)  Medications Ordered in ED Medications - No data to display   Initial Impression / Assessment and Plan / ED Course  Robinson have reviewed the triage vital signs and the nursing notes.  Pertinent labs & imaging results that were available during my care of the patient were reviewed by me and considered in my medical decision making (see chart for details).  Clinical Course as of Jul 07 1507  Tue Jul 06, 2018  1454 Pt is alert.  Pt feels at his baseline.      [JK]    Clinical Course User Index [JK] Dorie Rank, MD    Pt presented to the ED for evaluation of lethargy after dialysis.  ED workup reassuring.  Anemia and chronic kidney disease noted but no acute issues.  CT scan shows old strokes but no acute findings.  Pt has a normal neuro exam.  Pt was monitored in the ED and he became more alert.  He is wake, and ready to leave.  ?sx related to his dialysis.  Final Clinical Impressions(s) / ED Diagnoses   Final diagnoses:  Lethargy    ED Discharge Orders    None       Dorie Rank, MD 07/06/18 1511

## 2018-07-06 NOTE — ED Notes (Signed)
Patient verbalizes understanding of discharge instructions. Opportunity for questioning and answers were provided. Armband removed by staff, pt discharged from ED.  

## 2018-07-08 DIAGNOSIS — Z992 Dependence on renal dialysis: Secondary | ICD-10-CM | POA: Diagnosis not present

## 2018-07-08 DIAGNOSIS — N2581 Secondary hyperparathyroidism of renal origin: Secondary | ICD-10-CM | POA: Diagnosis not present

## 2018-07-08 DIAGNOSIS — N186 End stage renal disease: Secondary | ICD-10-CM | POA: Diagnosis not present

## 2018-07-08 DIAGNOSIS — E876 Hypokalemia: Secondary | ICD-10-CM | POA: Diagnosis not present

## 2018-07-08 DIAGNOSIS — D631 Anemia in chronic kidney disease: Secondary | ICD-10-CM | POA: Diagnosis not present

## 2018-07-10 DIAGNOSIS — E876 Hypokalemia: Secondary | ICD-10-CM | POA: Diagnosis not present

## 2018-07-10 DIAGNOSIS — Z992 Dependence on renal dialysis: Secondary | ICD-10-CM | POA: Diagnosis not present

## 2018-07-10 DIAGNOSIS — N186 End stage renal disease: Secondary | ICD-10-CM | POA: Diagnosis not present

## 2018-07-10 DIAGNOSIS — N2581 Secondary hyperparathyroidism of renal origin: Secondary | ICD-10-CM | POA: Diagnosis not present

## 2018-07-10 DIAGNOSIS — D631 Anemia in chronic kidney disease: Secondary | ICD-10-CM | POA: Diagnosis not present

## 2018-07-13 DIAGNOSIS — N2581 Secondary hyperparathyroidism of renal origin: Secondary | ICD-10-CM | POA: Diagnosis not present

## 2018-07-13 DIAGNOSIS — Z992 Dependence on renal dialysis: Secondary | ICD-10-CM | POA: Diagnosis not present

## 2018-07-13 DIAGNOSIS — N186 End stage renal disease: Secondary | ICD-10-CM | POA: Diagnosis not present

## 2018-07-13 DIAGNOSIS — E876 Hypokalemia: Secondary | ICD-10-CM | POA: Diagnosis not present

## 2018-07-13 DIAGNOSIS — D631 Anemia in chronic kidney disease: Secondary | ICD-10-CM | POA: Diagnosis not present

## 2018-07-15 DIAGNOSIS — Z992 Dependence on renal dialysis: Secondary | ICD-10-CM | POA: Diagnosis not present

## 2018-07-15 DIAGNOSIS — E876 Hypokalemia: Secondary | ICD-10-CM | POA: Diagnosis not present

## 2018-07-15 DIAGNOSIS — D631 Anemia in chronic kidney disease: Secondary | ICD-10-CM | POA: Diagnosis not present

## 2018-07-15 DIAGNOSIS — N2581 Secondary hyperparathyroidism of renal origin: Secondary | ICD-10-CM | POA: Diagnosis not present

## 2018-07-15 DIAGNOSIS — N186 End stage renal disease: Secondary | ICD-10-CM | POA: Diagnosis not present

## 2018-07-17 DIAGNOSIS — N2581 Secondary hyperparathyroidism of renal origin: Secondary | ICD-10-CM | POA: Diagnosis not present

## 2018-07-17 DIAGNOSIS — Z992 Dependence on renal dialysis: Secondary | ICD-10-CM | POA: Diagnosis not present

## 2018-07-17 DIAGNOSIS — E876 Hypokalemia: Secondary | ICD-10-CM | POA: Diagnosis not present

## 2018-07-17 DIAGNOSIS — D631 Anemia in chronic kidney disease: Secondary | ICD-10-CM | POA: Diagnosis not present

## 2018-07-17 DIAGNOSIS — N186 End stage renal disease: Secondary | ICD-10-CM | POA: Diagnosis not present

## 2018-07-20 DIAGNOSIS — N186 End stage renal disease: Secondary | ICD-10-CM | POA: Diagnosis not present

## 2018-07-20 DIAGNOSIS — Z992 Dependence on renal dialysis: Secondary | ICD-10-CM | POA: Diagnosis not present

## 2018-07-20 DIAGNOSIS — D631 Anemia in chronic kidney disease: Secondary | ICD-10-CM | POA: Diagnosis not present

## 2018-07-20 DIAGNOSIS — E876 Hypokalemia: Secondary | ICD-10-CM | POA: Diagnosis not present

## 2018-07-20 DIAGNOSIS — N2581 Secondary hyperparathyroidism of renal origin: Secondary | ICD-10-CM | POA: Diagnosis not present

## 2018-07-22 DIAGNOSIS — N186 End stage renal disease: Secondary | ICD-10-CM | POA: Diagnosis not present

## 2018-07-22 DIAGNOSIS — N2581 Secondary hyperparathyroidism of renal origin: Secondary | ICD-10-CM | POA: Diagnosis not present

## 2018-07-22 DIAGNOSIS — E876 Hypokalemia: Secondary | ICD-10-CM | POA: Diagnosis not present

## 2018-07-22 DIAGNOSIS — D631 Anemia in chronic kidney disease: Secondary | ICD-10-CM | POA: Diagnosis not present

## 2018-07-22 DIAGNOSIS — Z992 Dependence on renal dialysis: Secondary | ICD-10-CM | POA: Diagnosis not present

## 2018-07-24 DIAGNOSIS — E876 Hypokalemia: Secondary | ICD-10-CM | POA: Diagnosis not present

## 2018-07-24 DIAGNOSIS — N2581 Secondary hyperparathyroidism of renal origin: Secondary | ICD-10-CM | POA: Diagnosis not present

## 2018-07-24 DIAGNOSIS — N186 End stage renal disease: Secondary | ICD-10-CM | POA: Diagnosis not present

## 2018-07-24 DIAGNOSIS — D631 Anemia in chronic kidney disease: Secondary | ICD-10-CM | POA: Diagnosis not present

## 2018-07-24 DIAGNOSIS — Z992 Dependence on renal dialysis: Secondary | ICD-10-CM | POA: Diagnosis not present

## 2018-07-26 DIAGNOSIS — E876 Hypokalemia: Secondary | ICD-10-CM | POA: Diagnosis not present

## 2018-07-26 DIAGNOSIS — N186 End stage renal disease: Secondary | ICD-10-CM | POA: Diagnosis not present

## 2018-07-26 DIAGNOSIS — N2581 Secondary hyperparathyroidism of renal origin: Secondary | ICD-10-CM | POA: Diagnosis not present

## 2018-07-26 DIAGNOSIS — Z992 Dependence on renal dialysis: Secondary | ICD-10-CM | POA: Diagnosis not present

## 2018-07-26 DIAGNOSIS — D631 Anemia in chronic kidney disease: Secondary | ICD-10-CM | POA: Diagnosis not present

## 2018-07-28 DIAGNOSIS — E876 Hypokalemia: Secondary | ICD-10-CM | POA: Diagnosis not present

## 2018-07-28 DIAGNOSIS — D631 Anemia in chronic kidney disease: Secondary | ICD-10-CM | POA: Diagnosis not present

## 2018-07-28 DIAGNOSIS — N186 End stage renal disease: Secondary | ICD-10-CM | POA: Diagnosis not present

## 2018-07-28 DIAGNOSIS — Z992 Dependence on renal dialysis: Secondary | ICD-10-CM | POA: Diagnosis not present

## 2018-07-28 DIAGNOSIS — N2581 Secondary hyperparathyroidism of renal origin: Secondary | ICD-10-CM | POA: Diagnosis not present

## 2018-07-31 DIAGNOSIS — N2581 Secondary hyperparathyroidism of renal origin: Secondary | ICD-10-CM | POA: Diagnosis not present

## 2018-07-31 DIAGNOSIS — E876 Hypokalemia: Secondary | ICD-10-CM | POA: Diagnosis not present

## 2018-07-31 DIAGNOSIS — N186 End stage renal disease: Secondary | ICD-10-CM | POA: Diagnosis not present

## 2018-07-31 DIAGNOSIS — Z992 Dependence on renal dialysis: Secondary | ICD-10-CM | POA: Diagnosis not present

## 2018-07-31 DIAGNOSIS — D631 Anemia in chronic kidney disease: Secondary | ICD-10-CM | POA: Diagnosis not present

## 2018-08-01 DIAGNOSIS — Z992 Dependence on renal dialysis: Secondary | ICD-10-CM | POA: Diagnosis not present

## 2018-08-01 DIAGNOSIS — I10 Essential (primary) hypertension: Secondary | ICD-10-CM | POA: Diagnosis not present

## 2018-08-01 DIAGNOSIS — N186 End stage renal disease: Secondary | ICD-10-CM | POA: Diagnosis not present

## 2018-08-03 DIAGNOSIS — D631 Anemia in chronic kidney disease: Secondary | ICD-10-CM | POA: Diagnosis not present

## 2018-08-03 DIAGNOSIS — E876 Hypokalemia: Secondary | ICD-10-CM | POA: Diagnosis not present

## 2018-08-03 DIAGNOSIS — N186 End stage renal disease: Secondary | ICD-10-CM | POA: Diagnosis not present

## 2018-08-03 DIAGNOSIS — N2581 Secondary hyperparathyroidism of renal origin: Secondary | ICD-10-CM | POA: Diagnosis not present

## 2018-08-06 DIAGNOSIS — N2581 Secondary hyperparathyroidism of renal origin: Secondary | ICD-10-CM | POA: Diagnosis not present

## 2018-08-06 DIAGNOSIS — N186 End stage renal disease: Secondary | ICD-10-CM | POA: Diagnosis not present

## 2018-08-06 DIAGNOSIS — D631 Anemia in chronic kidney disease: Secondary | ICD-10-CM | POA: Diagnosis not present

## 2018-08-06 DIAGNOSIS — E876 Hypokalemia: Secondary | ICD-10-CM | POA: Diagnosis not present

## 2018-08-10 DIAGNOSIS — N2581 Secondary hyperparathyroidism of renal origin: Secondary | ICD-10-CM | POA: Diagnosis not present

## 2018-08-10 DIAGNOSIS — E876 Hypokalemia: Secondary | ICD-10-CM | POA: Diagnosis not present

## 2018-08-10 DIAGNOSIS — N186 End stage renal disease: Secondary | ICD-10-CM | POA: Diagnosis not present

## 2018-08-10 DIAGNOSIS — D631 Anemia in chronic kidney disease: Secondary | ICD-10-CM | POA: Diagnosis not present

## 2018-08-12 DIAGNOSIS — N186 End stage renal disease: Secondary | ICD-10-CM | POA: Diagnosis not present

## 2018-08-12 DIAGNOSIS — E876 Hypokalemia: Secondary | ICD-10-CM | POA: Diagnosis not present

## 2018-08-12 DIAGNOSIS — D631 Anemia in chronic kidney disease: Secondary | ICD-10-CM | POA: Diagnosis not present

## 2018-08-12 DIAGNOSIS — N2581 Secondary hyperparathyroidism of renal origin: Secondary | ICD-10-CM | POA: Diagnosis not present

## 2018-08-14 DIAGNOSIS — D631 Anemia in chronic kidney disease: Secondary | ICD-10-CM | POA: Diagnosis not present

## 2018-08-14 DIAGNOSIS — N2581 Secondary hyperparathyroidism of renal origin: Secondary | ICD-10-CM | POA: Diagnosis not present

## 2018-08-14 DIAGNOSIS — N186 End stage renal disease: Secondary | ICD-10-CM | POA: Diagnosis not present

## 2018-08-14 DIAGNOSIS — E876 Hypokalemia: Secondary | ICD-10-CM | POA: Diagnosis not present

## 2018-08-16 DIAGNOSIS — Z1211 Encounter for screening for malignant neoplasm of colon: Secondary | ICD-10-CM | POA: Diagnosis not present

## 2018-08-16 DIAGNOSIS — I150 Renovascular hypertension: Secondary | ICD-10-CM | POA: Diagnosis not present

## 2018-08-16 DIAGNOSIS — Z0001 Encounter for general adult medical examination with abnormal findings: Secondary | ICD-10-CM | POA: Diagnosis not present

## 2018-08-17 DIAGNOSIS — N2581 Secondary hyperparathyroidism of renal origin: Secondary | ICD-10-CM | POA: Diagnosis not present

## 2018-08-17 DIAGNOSIS — E876 Hypokalemia: Secondary | ICD-10-CM | POA: Diagnosis not present

## 2018-08-17 DIAGNOSIS — N186 End stage renal disease: Secondary | ICD-10-CM | POA: Diagnosis not present

## 2018-08-17 DIAGNOSIS — D631 Anemia in chronic kidney disease: Secondary | ICD-10-CM | POA: Diagnosis not present

## 2018-08-18 DIAGNOSIS — Z1211 Encounter for screening for malignant neoplasm of colon: Secondary | ICD-10-CM | POA: Diagnosis not present

## 2018-08-23 DIAGNOSIS — N2581 Secondary hyperparathyroidism of renal origin: Secondary | ICD-10-CM | POA: Diagnosis not present

## 2018-08-23 DIAGNOSIS — D631 Anemia in chronic kidney disease: Secondary | ICD-10-CM | POA: Diagnosis not present

## 2018-08-23 DIAGNOSIS — E876 Hypokalemia: Secondary | ICD-10-CM | POA: Diagnosis not present

## 2018-08-23 DIAGNOSIS — N186 End stage renal disease: Secondary | ICD-10-CM | POA: Diagnosis not present

## 2018-08-26 DIAGNOSIS — D631 Anemia in chronic kidney disease: Secondary | ICD-10-CM | POA: Diagnosis not present

## 2018-08-26 DIAGNOSIS — N186 End stage renal disease: Secondary | ICD-10-CM | POA: Diagnosis not present

## 2018-08-26 DIAGNOSIS — N2581 Secondary hyperparathyroidism of renal origin: Secondary | ICD-10-CM | POA: Diagnosis not present

## 2018-08-26 DIAGNOSIS — E876 Hypokalemia: Secondary | ICD-10-CM | POA: Diagnosis not present

## 2018-08-29 DIAGNOSIS — N186 End stage renal disease: Secondary | ICD-10-CM | POA: Diagnosis not present

## 2018-08-29 DIAGNOSIS — E876 Hypokalemia: Secondary | ICD-10-CM | POA: Diagnosis not present

## 2018-08-29 DIAGNOSIS — N2581 Secondary hyperparathyroidism of renal origin: Secondary | ICD-10-CM | POA: Diagnosis not present

## 2018-08-29 DIAGNOSIS — D631 Anemia in chronic kidney disease: Secondary | ICD-10-CM | POA: Diagnosis not present

## 2018-08-31 DIAGNOSIS — N2581 Secondary hyperparathyroidism of renal origin: Secondary | ICD-10-CM | POA: Diagnosis not present

## 2018-08-31 DIAGNOSIS — E876 Hypokalemia: Secondary | ICD-10-CM | POA: Diagnosis not present

## 2018-08-31 DIAGNOSIS — N186 End stage renal disease: Secondary | ICD-10-CM | POA: Diagnosis not present

## 2018-08-31 DIAGNOSIS — D631 Anemia in chronic kidney disease: Secondary | ICD-10-CM | POA: Diagnosis not present

## 2018-09-01 DIAGNOSIS — Z992 Dependence on renal dialysis: Secondary | ICD-10-CM | POA: Diagnosis not present

## 2018-09-01 DIAGNOSIS — N186 End stage renal disease: Secondary | ICD-10-CM | POA: Diagnosis not present

## 2018-09-01 DIAGNOSIS — I10 Essential (primary) hypertension: Secondary | ICD-10-CM | POA: Diagnosis not present

## 2018-09-03 DIAGNOSIS — E876 Hypokalemia: Secondary | ICD-10-CM | POA: Diagnosis not present

## 2018-09-03 DIAGNOSIS — D631 Anemia in chronic kidney disease: Secondary | ICD-10-CM | POA: Diagnosis not present

## 2018-09-03 DIAGNOSIS — N186 End stage renal disease: Secondary | ICD-10-CM | POA: Diagnosis not present

## 2018-09-03 DIAGNOSIS — N2581 Secondary hyperparathyroidism of renal origin: Secondary | ICD-10-CM | POA: Diagnosis not present

## 2018-09-03 DIAGNOSIS — D509 Iron deficiency anemia, unspecified: Secondary | ICD-10-CM | POA: Diagnosis not present

## 2018-09-07 DIAGNOSIS — D631 Anemia in chronic kidney disease: Secondary | ICD-10-CM | POA: Diagnosis not present

## 2018-09-07 DIAGNOSIS — E876 Hypokalemia: Secondary | ICD-10-CM | POA: Diagnosis not present

## 2018-09-07 DIAGNOSIS — N186 End stage renal disease: Secondary | ICD-10-CM | POA: Diagnosis not present

## 2018-09-07 DIAGNOSIS — D509 Iron deficiency anemia, unspecified: Secondary | ICD-10-CM | POA: Diagnosis not present

## 2018-09-07 DIAGNOSIS — N2581 Secondary hyperparathyroidism of renal origin: Secondary | ICD-10-CM | POA: Diagnosis not present

## 2018-09-09 DIAGNOSIS — G2581 Restless legs syndrome: Secondary | ICD-10-CM | POA: Diagnosis not present

## 2018-09-09 DIAGNOSIS — D631 Anemia in chronic kidney disease: Secondary | ICD-10-CM | POA: Diagnosis not present

## 2018-09-09 DIAGNOSIS — N2581 Secondary hyperparathyroidism of renal origin: Secondary | ICD-10-CM | POA: Diagnosis not present

## 2018-09-09 DIAGNOSIS — N521 Erectile dysfunction due to diseases classified elsewhere: Secondary | ICD-10-CM | POA: Diagnosis not present

## 2018-09-09 DIAGNOSIS — D509 Iron deficiency anemia, unspecified: Secondary | ICD-10-CM | POA: Diagnosis not present

## 2018-09-09 DIAGNOSIS — G5621 Lesion of ulnar nerve, right upper limb: Secondary | ICD-10-CM | POA: Diagnosis not present

## 2018-09-09 DIAGNOSIS — R202 Paresthesia of skin: Secondary | ICD-10-CM | POA: Diagnosis not present

## 2018-09-09 DIAGNOSIS — G5601 Carpal tunnel syndrome, right upper limb: Secondary | ICD-10-CM | POA: Diagnosis not present

## 2018-09-09 DIAGNOSIS — I2510 Atherosclerotic heart disease of native coronary artery without angina pectoris: Secondary | ICD-10-CM | POA: Diagnosis not present

## 2018-09-09 DIAGNOSIS — N186 End stage renal disease: Secondary | ICD-10-CM | POA: Diagnosis not present

## 2018-09-09 DIAGNOSIS — I150 Renovascular hypertension: Secondary | ICD-10-CM | POA: Diagnosis not present

## 2018-09-09 DIAGNOSIS — Z992 Dependence on renal dialysis: Secondary | ICD-10-CM | POA: Diagnosis not present

## 2018-09-09 DIAGNOSIS — E876 Hypokalemia: Secondary | ICD-10-CM | POA: Diagnosis not present

## 2018-09-13 DIAGNOSIS — G5621 Lesion of ulnar nerve, right upper limb: Secondary | ICD-10-CM

## 2018-09-13 DIAGNOSIS — M25521 Pain in right elbow: Secondary | ICD-10-CM | POA: Insufficient documentation

## 2018-09-13 HISTORY — DX: Pain in right elbow: M25.521

## 2018-09-13 HISTORY — DX: Lesion of ulnar nerve, right upper limb: G56.21

## 2018-09-14 DIAGNOSIS — D631 Anemia in chronic kidney disease: Secondary | ICD-10-CM | POA: Diagnosis not present

## 2018-09-14 DIAGNOSIS — N186 End stage renal disease: Secondary | ICD-10-CM | POA: Diagnosis not present

## 2018-09-14 DIAGNOSIS — D509 Iron deficiency anemia, unspecified: Secondary | ICD-10-CM | POA: Diagnosis not present

## 2018-09-14 DIAGNOSIS — E876 Hypokalemia: Secondary | ICD-10-CM | POA: Diagnosis not present

## 2018-09-14 DIAGNOSIS — N2581 Secondary hyperparathyroidism of renal origin: Secondary | ICD-10-CM | POA: Diagnosis not present

## 2018-09-17 DIAGNOSIS — D509 Iron deficiency anemia, unspecified: Secondary | ICD-10-CM | POA: Diagnosis not present

## 2018-09-17 DIAGNOSIS — D631 Anemia in chronic kidney disease: Secondary | ICD-10-CM | POA: Diagnosis not present

## 2018-09-17 DIAGNOSIS — N186 End stage renal disease: Secondary | ICD-10-CM | POA: Diagnosis not present

## 2018-09-17 DIAGNOSIS — N2581 Secondary hyperparathyroidism of renal origin: Secondary | ICD-10-CM | POA: Diagnosis not present

## 2018-09-17 DIAGNOSIS — E876 Hypokalemia: Secondary | ICD-10-CM | POA: Diagnosis not present

## 2018-09-20 DIAGNOSIS — G5601 Carpal tunnel syndrome, right upper limb: Secondary | ICD-10-CM | POA: Diagnosis not present

## 2018-09-20 DIAGNOSIS — G5621 Lesion of ulnar nerve, right upper limb: Secondary | ICD-10-CM | POA: Diagnosis not present

## 2018-09-21 ENCOUNTER — Telehealth: Payer: Self-pay | Admitting: Family Medicine

## 2018-09-21 DIAGNOSIS — N186 End stage renal disease: Secondary | ICD-10-CM | POA: Diagnosis not present

## 2018-09-21 DIAGNOSIS — N2581 Secondary hyperparathyroidism of renal origin: Secondary | ICD-10-CM | POA: Diagnosis not present

## 2018-09-21 DIAGNOSIS — E876 Hypokalemia: Secondary | ICD-10-CM | POA: Diagnosis not present

## 2018-09-21 DIAGNOSIS — D631 Anemia in chronic kidney disease: Secondary | ICD-10-CM | POA: Diagnosis not present

## 2018-09-21 DIAGNOSIS — D509 Iron deficiency anemia, unspecified: Secondary | ICD-10-CM | POA: Diagnosis not present

## 2018-09-21 NOTE — Telephone Encounter (Signed)
Crystal with [employer]	[place] called for authorization for patient to be seen  Please follow up

## 2018-09-21 NOTE — Telephone Encounter (Signed)
Okay to give authorization

## 2018-09-21 NOTE — Telephone Encounter (Signed)
Patient has not yet been seen by you.

## 2018-09-24 DIAGNOSIS — E876 Hypokalemia: Secondary | ICD-10-CM | POA: Diagnosis not present

## 2018-09-24 DIAGNOSIS — D509 Iron deficiency anemia, unspecified: Secondary | ICD-10-CM | POA: Diagnosis not present

## 2018-09-24 DIAGNOSIS — N186 End stage renal disease: Secondary | ICD-10-CM | POA: Diagnosis not present

## 2018-09-24 DIAGNOSIS — D631 Anemia in chronic kidney disease: Secondary | ICD-10-CM | POA: Diagnosis not present

## 2018-09-24 DIAGNOSIS — N2581 Secondary hyperparathyroidism of renal origin: Secondary | ICD-10-CM | POA: Diagnosis not present

## 2018-09-24 NOTE — Telephone Encounter (Signed)
Verbal authorization was given by Alinda Sierras.

## 2018-09-27 DIAGNOSIS — D631 Anemia in chronic kidney disease: Secondary | ICD-10-CM | POA: Diagnosis not present

## 2018-09-27 DIAGNOSIS — E876 Hypokalemia: Secondary | ICD-10-CM | POA: Diagnosis not present

## 2018-09-27 DIAGNOSIS — N2581 Secondary hyperparathyroidism of renal origin: Secondary | ICD-10-CM | POA: Diagnosis not present

## 2018-09-27 DIAGNOSIS — N186 End stage renal disease: Secondary | ICD-10-CM | POA: Diagnosis not present

## 2018-09-27 DIAGNOSIS — D509 Iron deficiency anemia, unspecified: Secondary | ICD-10-CM | POA: Diagnosis not present

## 2018-09-30 DIAGNOSIS — D509 Iron deficiency anemia, unspecified: Secondary | ICD-10-CM | POA: Diagnosis not present

## 2018-09-30 DIAGNOSIS — N2581 Secondary hyperparathyroidism of renal origin: Secondary | ICD-10-CM | POA: Diagnosis not present

## 2018-09-30 DIAGNOSIS — N186 End stage renal disease: Secondary | ICD-10-CM | POA: Diagnosis not present

## 2018-09-30 DIAGNOSIS — E876 Hypokalemia: Secondary | ICD-10-CM | POA: Diagnosis not present

## 2018-09-30 DIAGNOSIS — D631 Anemia in chronic kidney disease: Secondary | ICD-10-CM | POA: Diagnosis not present

## 2018-10-02 DIAGNOSIS — D631 Anemia in chronic kidney disease: Secondary | ICD-10-CM | POA: Diagnosis not present

## 2018-10-02 DIAGNOSIS — N186 End stage renal disease: Secondary | ICD-10-CM | POA: Diagnosis not present

## 2018-10-02 DIAGNOSIS — N2581 Secondary hyperparathyroidism of renal origin: Secondary | ICD-10-CM | POA: Diagnosis not present

## 2018-10-02 DIAGNOSIS — D509 Iron deficiency anemia, unspecified: Secondary | ICD-10-CM | POA: Diagnosis not present

## 2018-10-02 DIAGNOSIS — E876 Hypokalemia: Secondary | ICD-10-CM | POA: Diagnosis not present

## 2018-10-02 DIAGNOSIS — I10 Essential (primary) hypertension: Secondary | ICD-10-CM | POA: Diagnosis not present

## 2018-10-02 DIAGNOSIS — Z992 Dependence on renal dialysis: Secondary | ICD-10-CM | POA: Diagnosis not present

## 2018-10-05 DIAGNOSIS — N2581 Secondary hyperparathyroidism of renal origin: Secondary | ICD-10-CM | POA: Diagnosis not present

## 2018-10-05 DIAGNOSIS — D509 Iron deficiency anemia, unspecified: Secondary | ICD-10-CM | POA: Diagnosis not present

## 2018-10-05 DIAGNOSIS — G5620 Lesion of ulnar nerve, unspecified upper limb: Secondary | ICD-10-CM

## 2018-10-05 DIAGNOSIS — N186 End stage renal disease: Secondary | ICD-10-CM | POA: Diagnosis not present

## 2018-10-05 DIAGNOSIS — E876 Hypokalemia: Secondary | ICD-10-CM | POA: Diagnosis not present

## 2018-10-05 DIAGNOSIS — D631 Anemia in chronic kidney disease: Secondary | ICD-10-CM | POA: Diagnosis not present

## 2018-10-05 DIAGNOSIS — G5601 Carpal tunnel syndrome, right upper limb: Secondary | ICD-10-CM | POA: Insufficient documentation

## 2018-10-05 HISTORY — DX: Carpal tunnel syndrome, right upper limb: G56.01

## 2018-10-05 HISTORY — DX: Lesion of ulnar nerve, unspecified upper limb: G56.20

## 2018-10-07 DIAGNOSIS — G5601 Carpal tunnel syndrome, right upper limb: Secondary | ICD-10-CM | POA: Diagnosis not present

## 2018-10-07 DIAGNOSIS — G5621 Lesion of ulnar nerve, right upper limb: Secondary | ICD-10-CM | POA: Diagnosis not present

## 2018-10-08 DIAGNOSIS — D509 Iron deficiency anemia, unspecified: Secondary | ICD-10-CM | POA: Diagnosis not present

## 2018-10-08 DIAGNOSIS — E876 Hypokalemia: Secondary | ICD-10-CM | POA: Diagnosis not present

## 2018-10-08 DIAGNOSIS — D631 Anemia in chronic kidney disease: Secondary | ICD-10-CM | POA: Diagnosis not present

## 2018-10-08 DIAGNOSIS — N186 End stage renal disease: Secondary | ICD-10-CM | POA: Diagnosis not present

## 2018-10-08 DIAGNOSIS — N2581 Secondary hyperparathyroidism of renal origin: Secondary | ICD-10-CM | POA: Diagnosis not present

## 2018-10-12 DIAGNOSIS — D631 Anemia in chronic kidney disease: Secondary | ICD-10-CM | POA: Diagnosis not present

## 2018-10-12 DIAGNOSIS — D509 Iron deficiency anemia, unspecified: Secondary | ICD-10-CM | POA: Diagnosis not present

## 2018-10-12 DIAGNOSIS — N186 End stage renal disease: Secondary | ICD-10-CM | POA: Diagnosis not present

## 2018-10-12 DIAGNOSIS — E876 Hypokalemia: Secondary | ICD-10-CM | POA: Diagnosis not present

## 2018-10-12 DIAGNOSIS — N2581 Secondary hyperparathyroidism of renal origin: Secondary | ICD-10-CM | POA: Diagnosis not present

## 2018-10-14 DIAGNOSIS — N186 End stage renal disease: Secondary | ICD-10-CM | POA: Diagnosis not present

## 2018-10-14 DIAGNOSIS — N2581 Secondary hyperparathyroidism of renal origin: Secondary | ICD-10-CM | POA: Diagnosis not present

## 2018-10-14 DIAGNOSIS — D631 Anemia in chronic kidney disease: Secondary | ICD-10-CM | POA: Diagnosis not present

## 2018-10-14 DIAGNOSIS — D509 Iron deficiency anemia, unspecified: Secondary | ICD-10-CM | POA: Diagnosis not present

## 2018-10-14 DIAGNOSIS — E876 Hypokalemia: Secondary | ICD-10-CM | POA: Diagnosis not present

## 2018-10-16 DIAGNOSIS — E876 Hypokalemia: Secondary | ICD-10-CM | POA: Diagnosis not present

## 2018-10-16 DIAGNOSIS — D631 Anemia in chronic kidney disease: Secondary | ICD-10-CM | POA: Diagnosis not present

## 2018-10-16 DIAGNOSIS — D509 Iron deficiency anemia, unspecified: Secondary | ICD-10-CM | POA: Diagnosis not present

## 2018-10-16 DIAGNOSIS — N2581 Secondary hyperparathyroidism of renal origin: Secondary | ICD-10-CM | POA: Diagnosis not present

## 2018-10-16 DIAGNOSIS — N186 End stage renal disease: Secondary | ICD-10-CM | POA: Diagnosis not present

## 2018-10-22 DIAGNOSIS — N2581 Secondary hyperparathyroidism of renal origin: Secondary | ICD-10-CM | POA: Diagnosis not present

## 2018-10-22 DIAGNOSIS — E876 Hypokalemia: Secondary | ICD-10-CM | POA: Diagnosis not present

## 2018-10-22 DIAGNOSIS — D631 Anemia in chronic kidney disease: Secondary | ICD-10-CM | POA: Diagnosis not present

## 2018-10-22 DIAGNOSIS — N186 End stage renal disease: Secondary | ICD-10-CM | POA: Diagnosis not present

## 2018-10-22 DIAGNOSIS — D509 Iron deficiency anemia, unspecified: Secondary | ICD-10-CM | POA: Diagnosis not present

## 2018-10-22 IMAGING — DX DG CHEST 1V PORT
1 series · 1 of 1 positions shown · non-contrast
Comparison: PA and lateral chest x-ray February 07, 2018

CLINICAL DATA: Chest pain, shortness of breath, and nausea for
several days. History of asthma, previous CVA, chronic renal
insufficiency.

EXAM:
PORTABLE CHEST 1 VIEW

[chest ap]
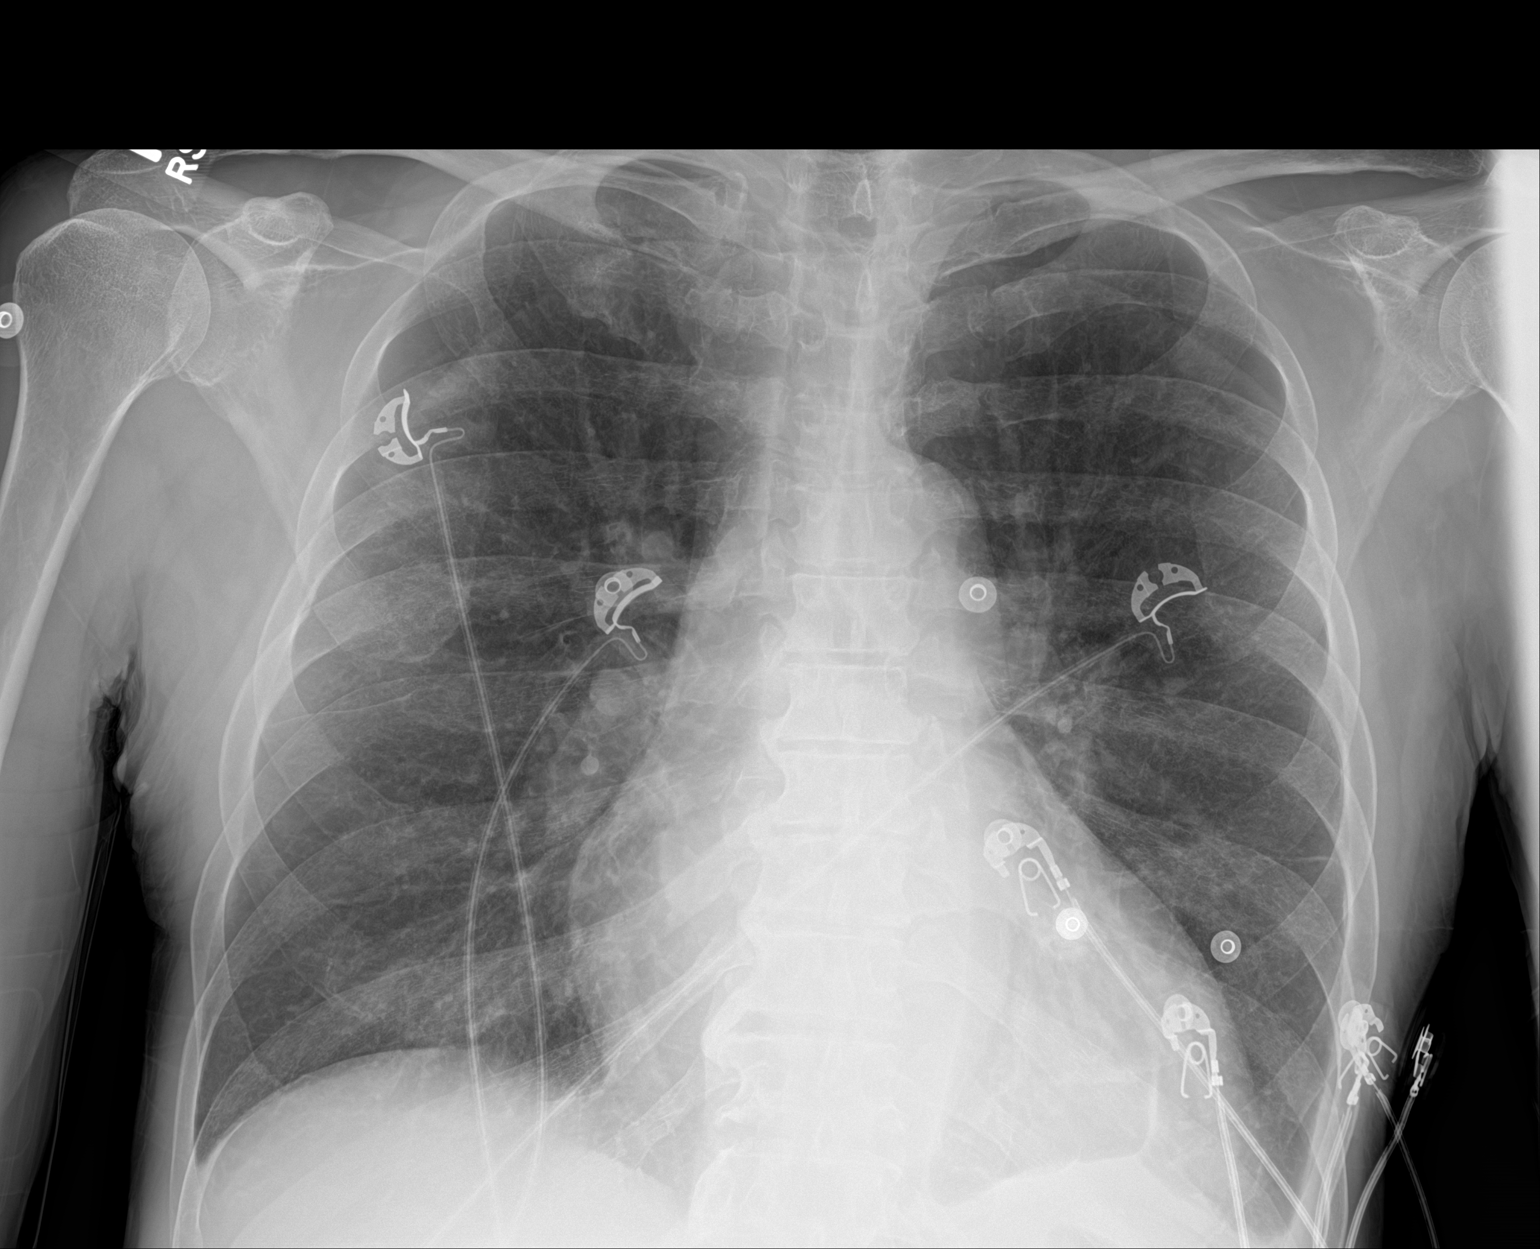

[1 of 1 positions shown; findings below may reference images not displayed]

FINDINGS: The lungs are adequately inflated. There is no focal infiltrate. The
heart is top-normal in size. The central pulmonary vascularity is
mildly prominent but stable. There may be a tiny left pleural
effusion. There is calcification in the wall of the aortic arch. The
bony thorax exhibits no acute abnormality.
IMPRESSION: No definite acute cardiopulmonary abnormality. Possible trace left
pleural effusion not completely included in the field of view. There
does not appear to have been significant interval change since the
previous study.

## 2018-10-26 DIAGNOSIS — D631 Anemia in chronic kidney disease: Secondary | ICD-10-CM | POA: Diagnosis not present

## 2018-10-26 DIAGNOSIS — E876 Hypokalemia: Secondary | ICD-10-CM | POA: Diagnosis not present

## 2018-10-26 DIAGNOSIS — N2581 Secondary hyperparathyroidism of renal origin: Secondary | ICD-10-CM | POA: Diagnosis not present

## 2018-10-26 DIAGNOSIS — D509 Iron deficiency anemia, unspecified: Secondary | ICD-10-CM | POA: Diagnosis not present

## 2018-10-26 DIAGNOSIS — N186 End stage renal disease: Secondary | ICD-10-CM | POA: Diagnosis not present

## 2018-10-28 DIAGNOSIS — D509 Iron deficiency anemia, unspecified: Secondary | ICD-10-CM | POA: Diagnosis not present

## 2018-10-28 DIAGNOSIS — E876 Hypokalemia: Secondary | ICD-10-CM | POA: Diagnosis not present

## 2018-10-28 DIAGNOSIS — N186 End stage renal disease: Secondary | ICD-10-CM | POA: Diagnosis not present

## 2018-10-28 DIAGNOSIS — D631 Anemia in chronic kidney disease: Secondary | ICD-10-CM | POA: Diagnosis not present

## 2018-10-28 DIAGNOSIS — N2581 Secondary hyperparathyroidism of renal origin: Secondary | ICD-10-CM | POA: Diagnosis not present

## 2018-10-30 DIAGNOSIS — E876 Hypokalemia: Secondary | ICD-10-CM | POA: Diagnosis not present

## 2018-10-30 DIAGNOSIS — N2581 Secondary hyperparathyroidism of renal origin: Secondary | ICD-10-CM | POA: Diagnosis not present

## 2018-10-30 DIAGNOSIS — D631 Anemia in chronic kidney disease: Secondary | ICD-10-CM | POA: Diagnosis not present

## 2018-10-30 DIAGNOSIS — D509 Iron deficiency anemia, unspecified: Secondary | ICD-10-CM | POA: Diagnosis not present

## 2018-10-30 DIAGNOSIS — N186 End stage renal disease: Secondary | ICD-10-CM | POA: Diagnosis not present

## 2018-10-31 DIAGNOSIS — N186 End stage renal disease: Secondary | ICD-10-CM | POA: Diagnosis not present

## 2018-10-31 DIAGNOSIS — Z992 Dependence on renal dialysis: Secondary | ICD-10-CM | POA: Diagnosis not present

## 2018-10-31 DIAGNOSIS — I10 Essential (primary) hypertension: Secondary | ICD-10-CM | POA: Diagnosis not present

## 2018-11-02 DIAGNOSIS — N186 End stage renal disease: Secondary | ICD-10-CM | POA: Diagnosis not present

## 2018-11-02 DIAGNOSIS — D509 Iron deficiency anemia, unspecified: Secondary | ICD-10-CM | POA: Diagnosis not present

## 2018-11-02 DIAGNOSIS — D631 Anemia in chronic kidney disease: Secondary | ICD-10-CM | POA: Diagnosis not present

## 2018-11-02 DIAGNOSIS — N2581 Secondary hyperparathyroidism of renal origin: Secondary | ICD-10-CM | POA: Diagnosis not present

## 2018-11-02 DIAGNOSIS — E876 Hypokalemia: Secondary | ICD-10-CM | POA: Diagnosis not present

## 2018-11-04 DIAGNOSIS — N186 End stage renal disease: Secondary | ICD-10-CM | POA: Diagnosis not present

## 2018-11-04 DIAGNOSIS — D631 Anemia in chronic kidney disease: Secondary | ICD-10-CM | POA: Diagnosis not present

## 2018-11-04 DIAGNOSIS — E876 Hypokalemia: Secondary | ICD-10-CM | POA: Diagnosis not present

## 2018-11-04 DIAGNOSIS — D509 Iron deficiency anemia, unspecified: Secondary | ICD-10-CM | POA: Diagnosis not present

## 2018-11-04 DIAGNOSIS — N2581 Secondary hyperparathyroidism of renal origin: Secondary | ICD-10-CM | POA: Diagnosis not present

## 2018-11-06 DIAGNOSIS — N2581 Secondary hyperparathyroidism of renal origin: Secondary | ICD-10-CM | POA: Diagnosis not present

## 2018-11-06 DIAGNOSIS — N186 End stage renal disease: Secondary | ICD-10-CM | POA: Diagnosis not present

## 2018-11-06 DIAGNOSIS — D631 Anemia in chronic kidney disease: Secondary | ICD-10-CM | POA: Diagnosis not present

## 2018-11-06 DIAGNOSIS — E876 Hypokalemia: Secondary | ICD-10-CM | POA: Diagnosis not present

## 2018-11-06 DIAGNOSIS — D509 Iron deficiency anemia, unspecified: Secondary | ICD-10-CM | POA: Diagnosis not present

## 2018-11-08 DIAGNOSIS — I150 Renovascular hypertension: Secondary | ICD-10-CM | POA: Diagnosis not present

## 2018-11-08 DIAGNOSIS — Z992 Dependence on renal dialysis: Secondary | ICD-10-CM | POA: Diagnosis not present

## 2018-11-08 DIAGNOSIS — I2510 Atherosclerotic heart disease of native coronary artery without angina pectoris: Secondary | ICD-10-CM | POA: Diagnosis not present

## 2018-11-08 DIAGNOSIS — G5621 Lesion of ulnar nerve, right upper limb: Secondary | ICD-10-CM | POA: Diagnosis not present

## 2018-11-08 DIAGNOSIS — N521 Erectile dysfunction due to diseases classified elsewhere: Secondary | ICD-10-CM | POA: Diagnosis not present

## 2018-11-08 DIAGNOSIS — Z9119 Patient's noncompliance with other medical treatment and regimen: Secondary | ICD-10-CM | POA: Diagnosis not present

## 2018-11-08 DIAGNOSIS — N186 End stage renal disease: Secondary | ICD-10-CM | POA: Diagnosis not present

## 2018-11-08 DIAGNOSIS — R202 Paresthesia of skin: Secondary | ICD-10-CM | POA: Diagnosis not present

## 2018-11-08 DIAGNOSIS — G2581 Restless legs syndrome: Secondary | ICD-10-CM | POA: Diagnosis not present

## 2018-11-08 DIAGNOSIS — G5601 Carpal tunnel syndrome, right upper limb: Secondary | ICD-10-CM | POA: Diagnosis not present

## 2018-11-09 DIAGNOSIS — E876 Hypokalemia: Secondary | ICD-10-CM | POA: Diagnosis not present

## 2018-11-09 DIAGNOSIS — N186 End stage renal disease: Secondary | ICD-10-CM | POA: Diagnosis not present

## 2018-11-09 DIAGNOSIS — D631 Anemia in chronic kidney disease: Secondary | ICD-10-CM | POA: Diagnosis not present

## 2018-11-09 DIAGNOSIS — D509 Iron deficiency anemia, unspecified: Secondary | ICD-10-CM | POA: Diagnosis not present

## 2018-11-09 DIAGNOSIS — N2581 Secondary hyperparathyroidism of renal origin: Secondary | ICD-10-CM | POA: Diagnosis not present

## 2018-11-11 DIAGNOSIS — E876 Hypokalemia: Secondary | ICD-10-CM | POA: Diagnosis not present

## 2018-11-11 DIAGNOSIS — D509 Iron deficiency anemia, unspecified: Secondary | ICD-10-CM | POA: Diagnosis not present

## 2018-11-11 DIAGNOSIS — D631 Anemia in chronic kidney disease: Secondary | ICD-10-CM | POA: Diagnosis not present

## 2018-11-11 DIAGNOSIS — N2581 Secondary hyperparathyroidism of renal origin: Secondary | ICD-10-CM | POA: Diagnosis not present

## 2018-11-11 DIAGNOSIS — N186 End stage renal disease: Secondary | ICD-10-CM | POA: Diagnosis not present

## 2018-11-15 DIAGNOSIS — I871 Compression of vein: Secondary | ICD-10-CM | POA: Diagnosis not present

## 2018-11-15 DIAGNOSIS — N186 End stage renal disease: Secondary | ICD-10-CM | POA: Diagnosis not present

## 2018-11-15 DIAGNOSIS — Z992 Dependence on renal dialysis: Secondary | ICD-10-CM | POA: Diagnosis not present

## 2018-11-16 DIAGNOSIS — N2581 Secondary hyperparathyroidism of renal origin: Secondary | ICD-10-CM | POA: Diagnosis not present

## 2018-11-16 DIAGNOSIS — D631 Anemia in chronic kidney disease: Secondary | ICD-10-CM | POA: Diagnosis not present

## 2018-11-16 DIAGNOSIS — D509 Iron deficiency anemia, unspecified: Secondary | ICD-10-CM | POA: Diagnosis not present

## 2018-11-16 DIAGNOSIS — N186 End stage renal disease: Secondary | ICD-10-CM | POA: Diagnosis not present

## 2018-11-16 DIAGNOSIS — E876 Hypokalemia: Secondary | ICD-10-CM | POA: Diagnosis not present

## 2018-11-18 DIAGNOSIS — N2581 Secondary hyperparathyroidism of renal origin: Secondary | ICD-10-CM | POA: Diagnosis not present

## 2018-11-18 DIAGNOSIS — E876 Hypokalemia: Secondary | ICD-10-CM | POA: Diagnosis not present

## 2018-11-18 DIAGNOSIS — D631 Anemia in chronic kidney disease: Secondary | ICD-10-CM | POA: Diagnosis not present

## 2018-11-18 DIAGNOSIS — N186 End stage renal disease: Secondary | ICD-10-CM | POA: Diagnosis not present

## 2018-11-18 DIAGNOSIS — D509 Iron deficiency anemia, unspecified: Secondary | ICD-10-CM | POA: Diagnosis not present

## 2018-11-22 DIAGNOSIS — N2581 Secondary hyperparathyroidism of renal origin: Secondary | ICD-10-CM | POA: Diagnosis not present

## 2018-11-22 DIAGNOSIS — N186 End stage renal disease: Secondary | ICD-10-CM | POA: Diagnosis not present

## 2018-11-22 DIAGNOSIS — E876 Hypokalemia: Secondary | ICD-10-CM | POA: Diagnosis not present

## 2018-11-22 DIAGNOSIS — D631 Anemia in chronic kidney disease: Secondary | ICD-10-CM | POA: Diagnosis not present

## 2018-11-22 DIAGNOSIS — D509 Iron deficiency anemia, unspecified: Secondary | ICD-10-CM | POA: Diagnosis not present

## 2018-11-24 DIAGNOSIS — N186 End stage renal disease: Secondary | ICD-10-CM | POA: Diagnosis not present

## 2018-11-24 DIAGNOSIS — D509 Iron deficiency anemia, unspecified: Secondary | ICD-10-CM | POA: Diagnosis not present

## 2018-11-24 DIAGNOSIS — N2581 Secondary hyperparathyroidism of renal origin: Secondary | ICD-10-CM | POA: Diagnosis not present

## 2018-11-24 DIAGNOSIS — D631 Anemia in chronic kidney disease: Secondary | ICD-10-CM | POA: Diagnosis not present

## 2018-11-24 DIAGNOSIS — E876 Hypokalemia: Secondary | ICD-10-CM | POA: Diagnosis not present

## 2018-11-27 DIAGNOSIS — N186 End stage renal disease: Secondary | ICD-10-CM | POA: Diagnosis not present

## 2018-11-27 DIAGNOSIS — E876 Hypokalemia: Secondary | ICD-10-CM | POA: Diagnosis not present

## 2018-11-27 DIAGNOSIS — D509 Iron deficiency anemia, unspecified: Secondary | ICD-10-CM | POA: Diagnosis not present

## 2018-11-27 DIAGNOSIS — D631 Anemia in chronic kidney disease: Secondary | ICD-10-CM | POA: Diagnosis not present

## 2018-11-27 DIAGNOSIS — N2581 Secondary hyperparathyroidism of renal origin: Secondary | ICD-10-CM | POA: Diagnosis not present

## 2018-11-30 DIAGNOSIS — D631 Anemia in chronic kidney disease: Secondary | ICD-10-CM | POA: Diagnosis not present

## 2018-11-30 DIAGNOSIS — D509 Iron deficiency anemia, unspecified: Secondary | ICD-10-CM | POA: Diagnosis not present

## 2018-11-30 DIAGNOSIS — E876 Hypokalemia: Secondary | ICD-10-CM | POA: Diagnosis not present

## 2018-11-30 DIAGNOSIS — N186 End stage renal disease: Secondary | ICD-10-CM | POA: Diagnosis not present

## 2018-11-30 DIAGNOSIS — N2581 Secondary hyperparathyroidism of renal origin: Secondary | ICD-10-CM | POA: Diagnosis not present

## 2018-12-01 DIAGNOSIS — G2581 Restless legs syndrome: Secondary | ICD-10-CM | POA: Diagnosis not present

## 2018-12-01 DIAGNOSIS — I10 Essential (primary) hypertension: Secondary | ICD-10-CM | POA: Diagnosis not present

## 2018-12-01 DIAGNOSIS — M549 Dorsalgia, unspecified: Secondary | ICD-10-CM | POA: Diagnosis not present

## 2018-12-01 DIAGNOSIS — M542 Cervicalgia: Secondary | ICD-10-CM | POA: Diagnosis not present

## 2018-12-01 DIAGNOSIS — N186 End stage renal disease: Secondary | ICD-10-CM | POA: Diagnosis not present

## 2018-12-01 DIAGNOSIS — Z992 Dependence on renal dialysis: Secondary | ICD-10-CM | POA: Diagnosis not present

## 2018-12-01 DIAGNOSIS — I119 Hypertensive heart disease without heart failure: Secondary | ICD-10-CM | POA: Diagnosis not present

## 2018-12-02 DIAGNOSIS — D631 Anemia in chronic kidney disease: Secondary | ICD-10-CM | POA: Diagnosis not present

## 2018-12-02 DIAGNOSIS — D509 Iron deficiency anemia, unspecified: Secondary | ICD-10-CM | POA: Diagnosis not present

## 2018-12-02 DIAGNOSIS — E876 Hypokalemia: Secondary | ICD-10-CM | POA: Diagnosis not present

## 2018-12-02 DIAGNOSIS — N2581 Secondary hyperparathyroidism of renal origin: Secondary | ICD-10-CM | POA: Diagnosis not present

## 2018-12-02 DIAGNOSIS — Z23 Encounter for immunization: Secondary | ICD-10-CM | POA: Diagnosis not present

## 2018-12-02 DIAGNOSIS — N186 End stage renal disease: Secondary | ICD-10-CM | POA: Diagnosis not present

## 2018-12-03 ENCOUNTER — Other Ambulatory Visit: Payer: Self-pay

## 2018-12-03 ENCOUNTER — Other Ambulatory Visit: Payer: Self-pay | Admitting: Family Medicine

## 2018-12-03 ENCOUNTER — Ambulatory Visit
Admission: RE | Admit: 2018-12-03 | Discharge: 2018-12-03 | Disposition: A | Discharge status: Home / Self Care | Payer: Medicare Other | Source: Ambulatory Visit | Attending: Family Medicine | Admitting: Family Medicine

## 2018-12-03 DIAGNOSIS — M549 Dorsalgia, unspecified: Secondary | ICD-10-CM

## 2018-12-03 DIAGNOSIS — M5030 Other cervical disc degeneration, unspecified cervical region: Secondary | ICD-10-CM | POA: Diagnosis not present

## 2018-12-03 DIAGNOSIS — M5136 Other intervertebral disc degeneration, lumbar region: Secondary | ICD-10-CM | POA: Diagnosis not present

## 2018-12-03 DIAGNOSIS — M542 Cervicalgia: Secondary | ICD-10-CM

## 2018-12-03 DIAGNOSIS — M546 Pain in thoracic spine: Secondary | ICD-10-CM | POA: Diagnosis not present

## 2018-12-06 DIAGNOSIS — M5032 Other cervical disc degeneration, mid-cervical region, unspecified level: Secondary | ICD-10-CM | POA: Diagnosis not present

## 2018-12-06 DIAGNOSIS — M9903 Segmental and somatic dysfunction of lumbar region: Secondary | ICD-10-CM | POA: Diagnosis not present

## 2018-12-06 DIAGNOSIS — M9902 Segmental and somatic dysfunction of thoracic region: Secondary | ICD-10-CM | POA: Diagnosis not present

## 2018-12-06 DIAGNOSIS — M9901 Segmental and somatic dysfunction of cervical region: Secondary | ICD-10-CM | POA: Diagnosis not present

## 2018-12-06 DIAGNOSIS — M5136 Other intervertebral disc degeneration, lumbar region: Secondary | ICD-10-CM | POA: Diagnosis not present

## 2018-12-06 DIAGNOSIS — M5134 Other intervertebral disc degeneration, thoracic region: Secondary | ICD-10-CM | POA: Diagnosis not present

## 2018-12-07 DIAGNOSIS — D631 Anemia in chronic kidney disease: Secondary | ICD-10-CM | POA: Diagnosis not present

## 2018-12-07 DIAGNOSIS — N186 End stage renal disease: Secondary | ICD-10-CM | POA: Diagnosis not present

## 2018-12-07 DIAGNOSIS — D509 Iron deficiency anemia, unspecified: Secondary | ICD-10-CM | POA: Diagnosis not present

## 2018-12-07 DIAGNOSIS — E876 Hypokalemia: Secondary | ICD-10-CM | POA: Diagnosis not present

## 2018-12-07 DIAGNOSIS — N2581 Secondary hyperparathyroidism of renal origin: Secondary | ICD-10-CM | POA: Diagnosis not present

## 2018-12-07 DIAGNOSIS — Z23 Encounter for immunization: Secondary | ICD-10-CM | POA: Diagnosis not present

## 2018-12-08 DIAGNOSIS — M5032 Other cervical disc degeneration, mid-cervical region, unspecified level: Secondary | ICD-10-CM | POA: Diagnosis not present

## 2018-12-08 DIAGNOSIS — M5134 Other intervertebral disc degeneration, thoracic region: Secondary | ICD-10-CM | POA: Diagnosis not present

## 2018-12-08 DIAGNOSIS — M9901 Segmental and somatic dysfunction of cervical region: Secondary | ICD-10-CM | POA: Diagnosis not present

## 2018-12-08 DIAGNOSIS — M5136 Other intervertebral disc degeneration, lumbar region: Secondary | ICD-10-CM | POA: Diagnosis not present

## 2018-12-08 DIAGNOSIS — M9902 Segmental and somatic dysfunction of thoracic region: Secondary | ICD-10-CM | POA: Diagnosis not present

## 2018-12-08 DIAGNOSIS — M9903 Segmental and somatic dysfunction of lumbar region: Secondary | ICD-10-CM | POA: Diagnosis not present

## 2018-12-10 DIAGNOSIS — N2581 Secondary hyperparathyroidism of renal origin: Secondary | ICD-10-CM | POA: Diagnosis not present

## 2018-12-10 DIAGNOSIS — Z23 Encounter for immunization: Secondary | ICD-10-CM | POA: Diagnosis not present

## 2018-12-10 DIAGNOSIS — N186 End stage renal disease: Secondary | ICD-10-CM | POA: Diagnosis not present

## 2018-12-10 DIAGNOSIS — D631 Anemia in chronic kidney disease: Secondary | ICD-10-CM | POA: Diagnosis not present

## 2018-12-10 DIAGNOSIS — D509 Iron deficiency anemia, unspecified: Secondary | ICD-10-CM | POA: Diagnosis not present

## 2018-12-10 DIAGNOSIS — E876 Hypokalemia: Secondary | ICD-10-CM | POA: Diagnosis not present

## 2018-12-13 DIAGNOSIS — M5134 Other intervertebral disc degeneration, thoracic region: Secondary | ICD-10-CM | POA: Diagnosis not present

## 2018-12-13 DIAGNOSIS — M9903 Segmental and somatic dysfunction of lumbar region: Secondary | ICD-10-CM | POA: Diagnosis not present

## 2018-12-13 DIAGNOSIS — M9901 Segmental and somatic dysfunction of cervical region: Secondary | ICD-10-CM | POA: Diagnosis not present

## 2018-12-13 DIAGNOSIS — M9902 Segmental and somatic dysfunction of thoracic region: Secondary | ICD-10-CM | POA: Diagnosis not present

## 2018-12-13 DIAGNOSIS — M5136 Other intervertebral disc degeneration, lumbar region: Secondary | ICD-10-CM | POA: Diagnosis not present

## 2018-12-13 DIAGNOSIS — M5032 Other cervical disc degeneration, mid-cervical region, unspecified level: Secondary | ICD-10-CM | POA: Diagnosis not present

## 2018-12-14 DIAGNOSIS — N2581 Secondary hyperparathyroidism of renal origin: Secondary | ICD-10-CM | POA: Diagnosis not present

## 2018-12-14 DIAGNOSIS — N186 End stage renal disease: Secondary | ICD-10-CM | POA: Diagnosis not present

## 2018-12-14 DIAGNOSIS — Z23 Encounter for immunization: Secondary | ICD-10-CM | POA: Diagnosis not present

## 2018-12-14 DIAGNOSIS — D509 Iron deficiency anemia, unspecified: Secondary | ICD-10-CM | POA: Diagnosis not present

## 2018-12-14 DIAGNOSIS — D631 Anemia in chronic kidney disease: Secondary | ICD-10-CM | POA: Diagnosis not present

## 2018-12-14 DIAGNOSIS — E876 Hypokalemia: Secondary | ICD-10-CM | POA: Diagnosis not present

## 2018-12-15 DIAGNOSIS — M5136 Other intervertebral disc degeneration, lumbar region: Secondary | ICD-10-CM | POA: Diagnosis not present

## 2018-12-15 DIAGNOSIS — M5032 Other cervical disc degeneration, mid-cervical region, unspecified level: Secondary | ICD-10-CM | POA: Diagnosis not present

## 2018-12-15 DIAGNOSIS — M5134 Other intervertebral disc degeneration, thoracic region: Secondary | ICD-10-CM | POA: Diagnosis not present

## 2018-12-15 DIAGNOSIS — M9902 Segmental and somatic dysfunction of thoracic region: Secondary | ICD-10-CM | POA: Diagnosis not present

## 2018-12-15 DIAGNOSIS — M9901 Segmental and somatic dysfunction of cervical region: Secondary | ICD-10-CM | POA: Diagnosis not present

## 2018-12-15 DIAGNOSIS — M9903 Segmental and somatic dysfunction of lumbar region: Secondary | ICD-10-CM | POA: Diagnosis not present

## 2018-12-17 DIAGNOSIS — E876 Hypokalemia: Secondary | ICD-10-CM | POA: Diagnosis not present

## 2018-12-17 DIAGNOSIS — D631 Anemia in chronic kidney disease: Secondary | ICD-10-CM | POA: Diagnosis not present

## 2018-12-17 DIAGNOSIS — N186 End stage renal disease: Secondary | ICD-10-CM | POA: Diagnosis not present

## 2018-12-17 DIAGNOSIS — N2581 Secondary hyperparathyroidism of renal origin: Secondary | ICD-10-CM | POA: Diagnosis not present

## 2018-12-17 DIAGNOSIS — D509 Iron deficiency anemia, unspecified: Secondary | ICD-10-CM | POA: Diagnosis not present

## 2018-12-17 DIAGNOSIS — Z23 Encounter for immunization: Secondary | ICD-10-CM | POA: Diagnosis not present

## 2018-12-17 IMAGING — CR DG CHEST 2V
2 series · 2 of 2 positions shown · non-contrast
Comparison: Prior radiograph from 02/16/2018.

CLINICAL DATA: Initial evaluation for acute left rib pain status
post recent fall 3 days ago.

EXAM:
CHEST - 2 VIEW

[chest lat]
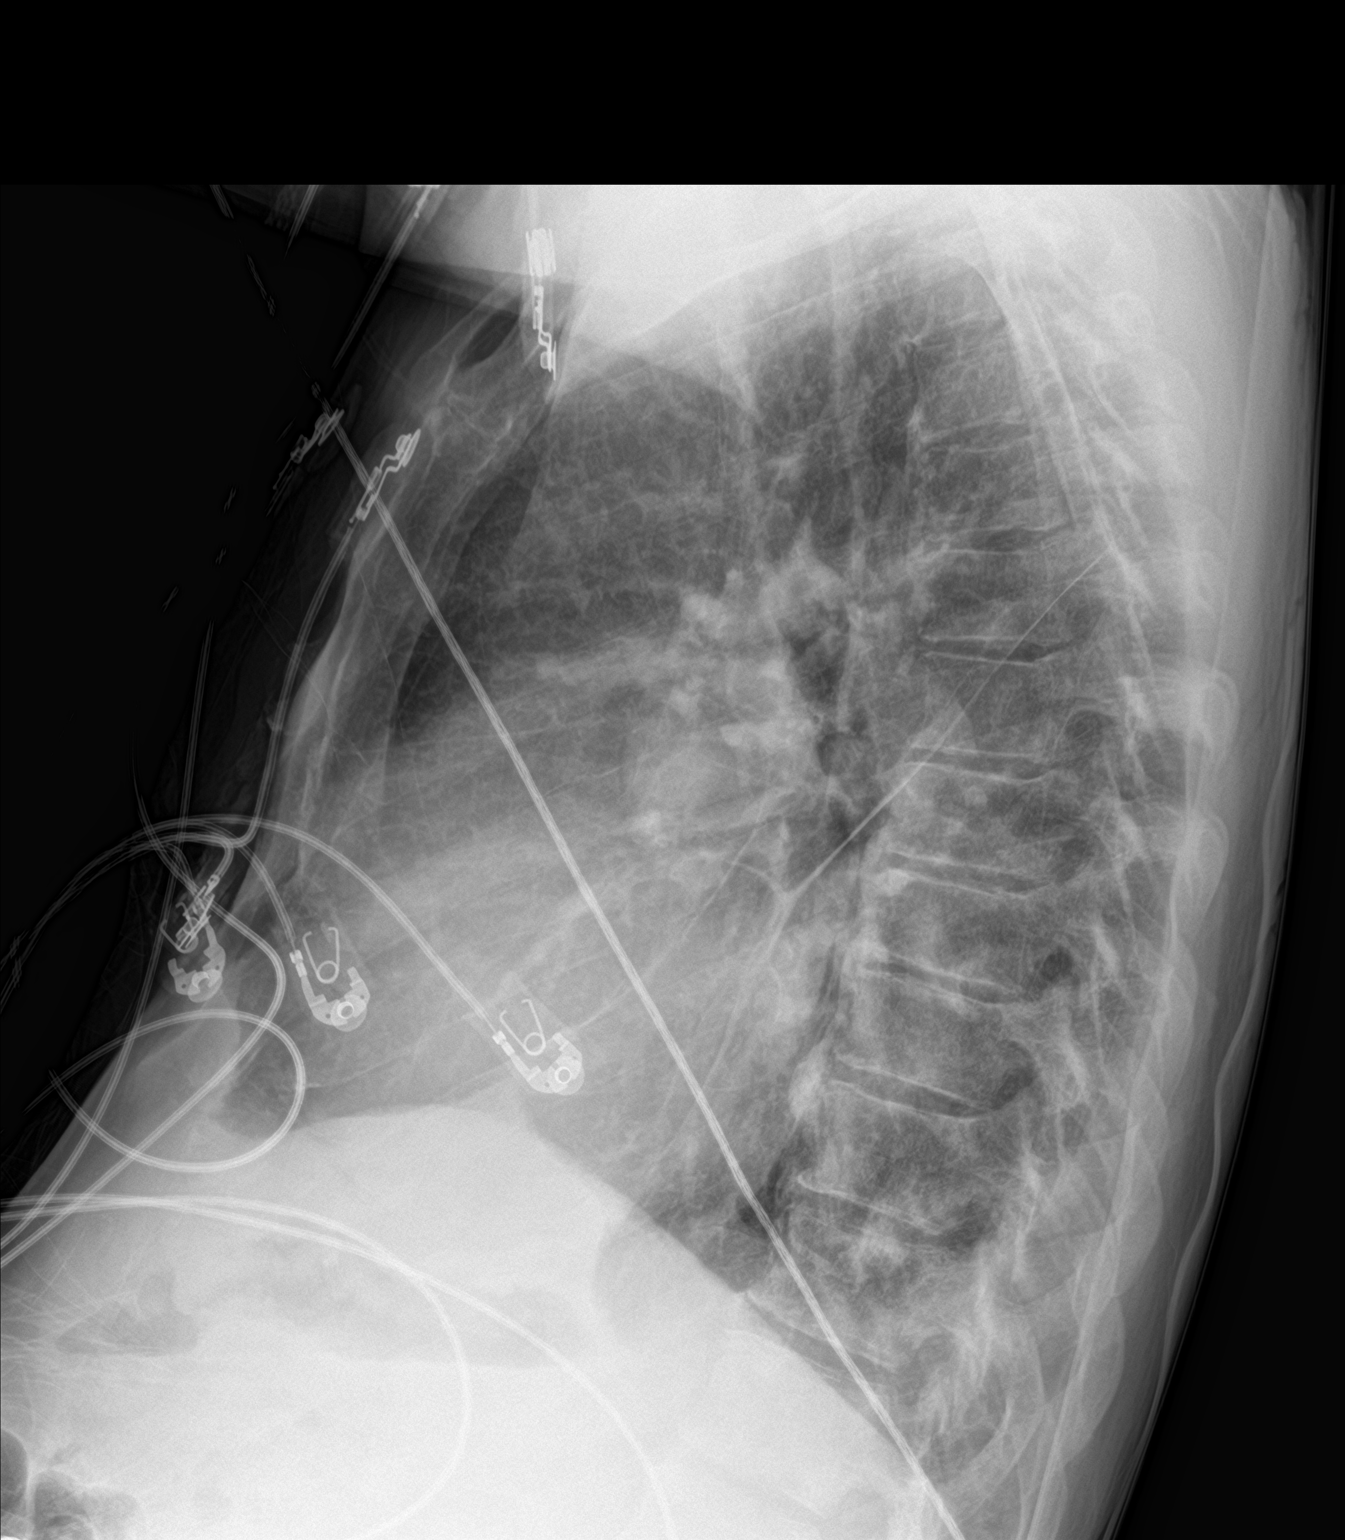

[chest ap]
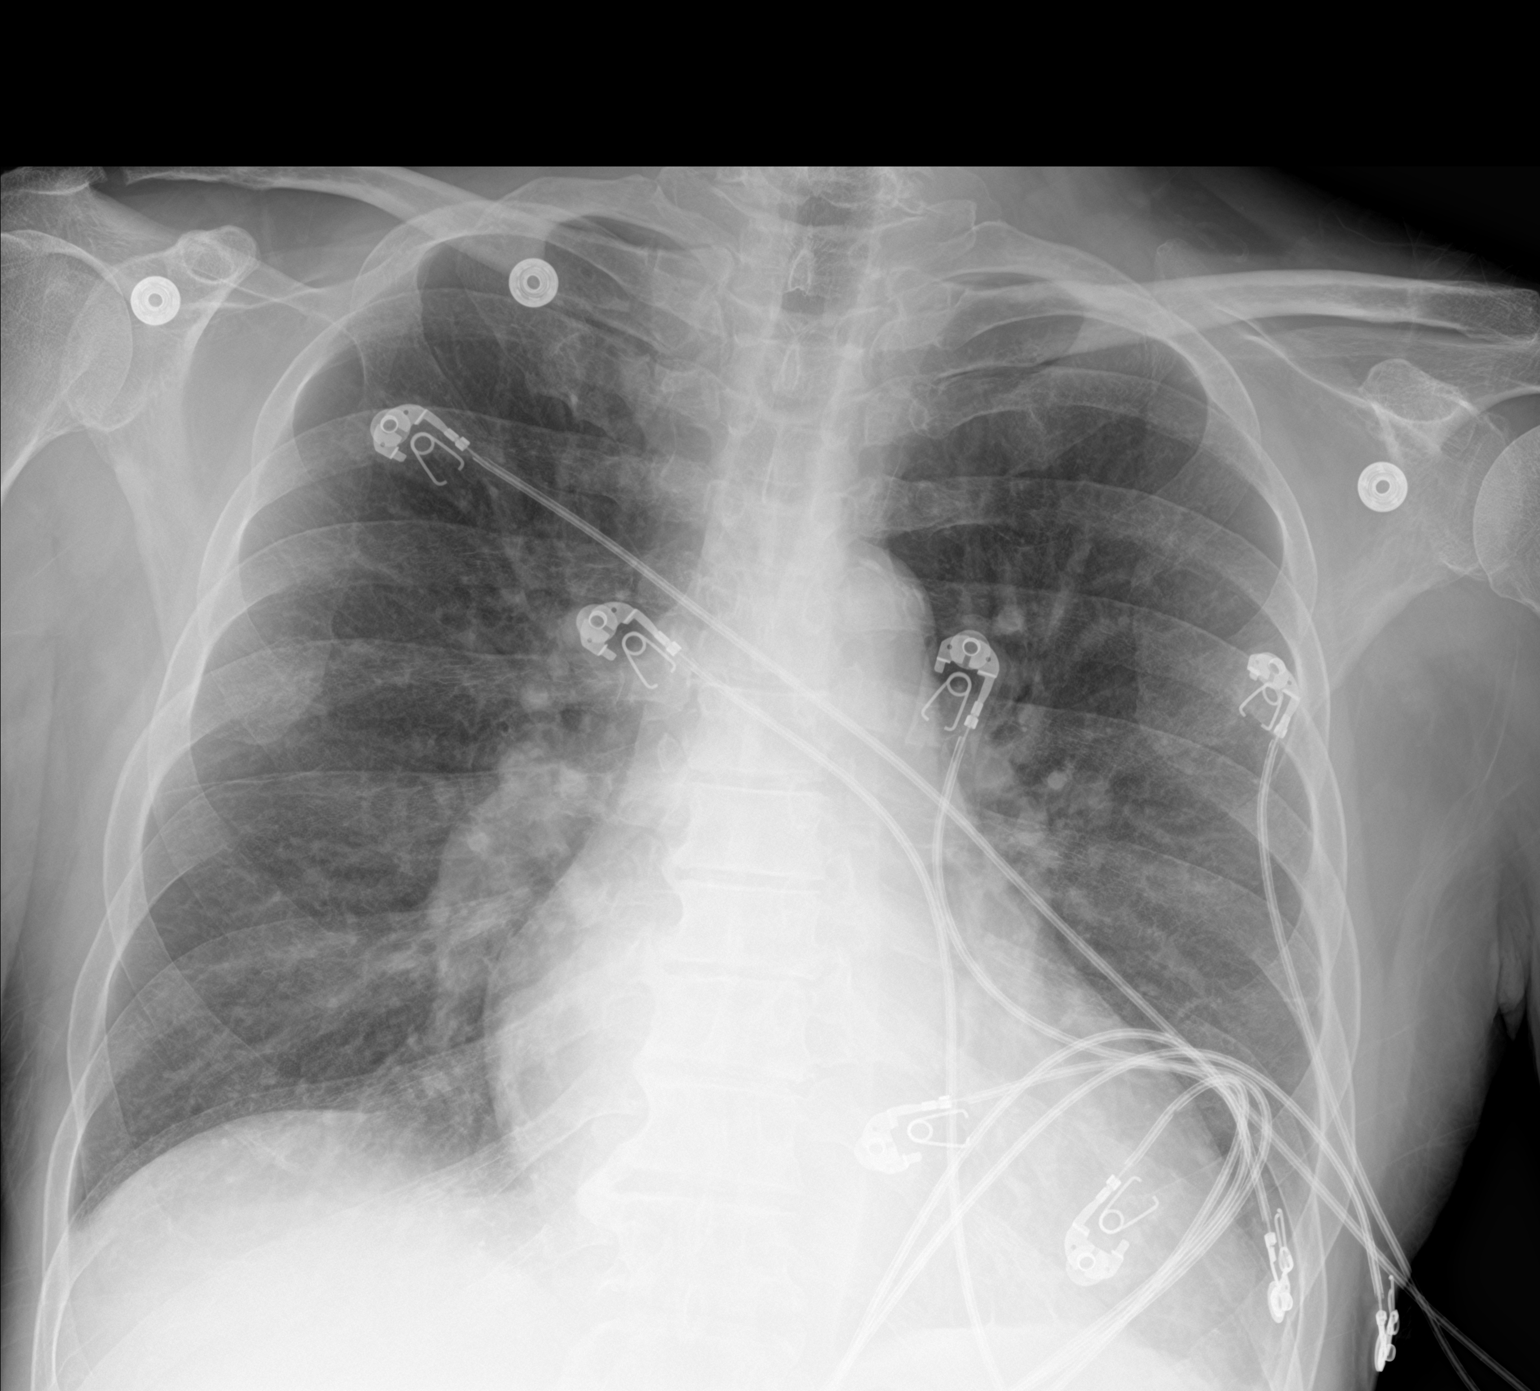

[2 of 2 positions shown; findings below may reference images not displayed]

FINDINGS: Moderate cardiomegaly, stable. Mediastinal silhouette within normal
limits. Aortic atherosclerosis.

Lungs normally inflated. Prominent vascular and interstitial
congestion suggestive of mild pulmonary interstitial
congestion/edema. No definite pleural effusion, although no focal
infiltrates. No pneumothorax.

No acute osseous abnormality. No appreciable displaced rib fracture
identified. Incompletely visualized.
IMPRESSION: 1. Cardiomegaly with mild diffuse pulmonary interstitial edema.
2. Aortic atherosclerosis.

## 2018-12-20 DIAGNOSIS — M9903 Segmental and somatic dysfunction of lumbar region: Secondary | ICD-10-CM | POA: Diagnosis not present

## 2018-12-20 DIAGNOSIS — M5032 Other cervical disc degeneration, mid-cervical region, unspecified level: Secondary | ICD-10-CM | POA: Diagnosis not present

## 2018-12-20 DIAGNOSIS — M5136 Other intervertebral disc degeneration, lumbar region: Secondary | ICD-10-CM | POA: Diagnosis not present

## 2018-12-20 DIAGNOSIS — M9901 Segmental and somatic dysfunction of cervical region: Secondary | ICD-10-CM | POA: Diagnosis not present

## 2018-12-20 DIAGNOSIS — M5134 Other intervertebral disc degeneration, thoracic region: Secondary | ICD-10-CM | POA: Diagnosis not present

## 2018-12-20 DIAGNOSIS — M9902 Segmental and somatic dysfunction of thoracic region: Secondary | ICD-10-CM | POA: Diagnosis not present

## 2018-12-21 DIAGNOSIS — N2581 Secondary hyperparathyroidism of renal origin: Secondary | ICD-10-CM | POA: Diagnosis not present

## 2018-12-21 DIAGNOSIS — Z23 Encounter for immunization: Secondary | ICD-10-CM | POA: Diagnosis not present

## 2018-12-21 DIAGNOSIS — E876 Hypokalemia: Secondary | ICD-10-CM | POA: Diagnosis not present

## 2018-12-21 DIAGNOSIS — D631 Anemia in chronic kidney disease: Secondary | ICD-10-CM | POA: Diagnosis not present

## 2018-12-21 DIAGNOSIS — D509 Iron deficiency anemia, unspecified: Secondary | ICD-10-CM | POA: Diagnosis not present

## 2018-12-21 DIAGNOSIS — N186 End stage renal disease: Secondary | ICD-10-CM | POA: Diagnosis not present

## 2018-12-22 DIAGNOSIS — M9901 Segmental and somatic dysfunction of cervical region: Secondary | ICD-10-CM | POA: Diagnosis not present

## 2018-12-22 DIAGNOSIS — M9902 Segmental and somatic dysfunction of thoracic region: Secondary | ICD-10-CM | POA: Diagnosis not present

## 2018-12-22 DIAGNOSIS — M5134 Other intervertebral disc degeneration, thoracic region: Secondary | ICD-10-CM | POA: Diagnosis not present

## 2018-12-22 DIAGNOSIS — M9903 Segmental and somatic dysfunction of lumbar region: Secondary | ICD-10-CM | POA: Diagnosis not present

## 2018-12-22 DIAGNOSIS — M5136 Other intervertebral disc degeneration, lumbar region: Secondary | ICD-10-CM | POA: Diagnosis not present

## 2018-12-22 DIAGNOSIS — M5032 Other cervical disc degeneration, mid-cervical region, unspecified level: Secondary | ICD-10-CM | POA: Diagnosis not present

## 2018-12-25 DIAGNOSIS — D631 Anemia in chronic kidney disease: Secondary | ICD-10-CM | POA: Diagnosis not present

## 2018-12-25 DIAGNOSIS — N2581 Secondary hyperparathyroidism of renal origin: Secondary | ICD-10-CM | POA: Diagnosis not present

## 2018-12-25 DIAGNOSIS — N186 End stage renal disease: Secondary | ICD-10-CM | POA: Diagnosis not present

## 2018-12-25 DIAGNOSIS — E876 Hypokalemia: Secondary | ICD-10-CM | POA: Diagnosis not present

## 2018-12-25 DIAGNOSIS — Z23 Encounter for immunization: Secondary | ICD-10-CM | POA: Diagnosis not present

## 2018-12-25 DIAGNOSIS — D509 Iron deficiency anemia, unspecified: Secondary | ICD-10-CM | POA: Diagnosis not present

## 2018-12-27 DIAGNOSIS — M5032 Other cervical disc degeneration, mid-cervical region, unspecified level: Secondary | ICD-10-CM | POA: Diagnosis not present

## 2018-12-27 DIAGNOSIS — M9901 Segmental and somatic dysfunction of cervical region: Secondary | ICD-10-CM | POA: Diagnosis not present

## 2018-12-27 DIAGNOSIS — M9903 Segmental and somatic dysfunction of lumbar region: Secondary | ICD-10-CM | POA: Diagnosis not present

## 2018-12-27 DIAGNOSIS — M5134 Other intervertebral disc degeneration, thoracic region: Secondary | ICD-10-CM | POA: Diagnosis not present

## 2018-12-27 DIAGNOSIS — M9902 Segmental and somatic dysfunction of thoracic region: Secondary | ICD-10-CM | POA: Diagnosis not present

## 2018-12-27 DIAGNOSIS — M5136 Other intervertebral disc degeneration, lumbar region: Secondary | ICD-10-CM | POA: Diagnosis not present

## 2018-12-28 DIAGNOSIS — D631 Anemia in chronic kidney disease: Secondary | ICD-10-CM | POA: Diagnosis not present

## 2018-12-28 DIAGNOSIS — E876 Hypokalemia: Secondary | ICD-10-CM | POA: Diagnosis not present

## 2018-12-28 DIAGNOSIS — N2581 Secondary hyperparathyroidism of renal origin: Secondary | ICD-10-CM | POA: Diagnosis not present

## 2018-12-28 DIAGNOSIS — Z23 Encounter for immunization: Secondary | ICD-10-CM | POA: Diagnosis not present

## 2018-12-28 DIAGNOSIS — D509 Iron deficiency anemia, unspecified: Secondary | ICD-10-CM | POA: Diagnosis not present

## 2018-12-28 DIAGNOSIS — N186 End stage renal disease: Secondary | ICD-10-CM | POA: Diagnosis not present

## 2018-12-29 DIAGNOSIS — M9901 Segmental and somatic dysfunction of cervical region: Secondary | ICD-10-CM | POA: Diagnosis not present

## 2018-12-29 DIAGNOSIS — M9902 Segmental and somatic dysfunction of thoracic region: Secondary | ICD-10-CM | POA: Diagnosis not present

## 2018-12-29 DIAGNOSIS — M5032 Other cervical disc degeneration, mid-cervical region, unspecified level: Secondary | ICD-10-CM | POA: Diagnosis not present

## 2018-12-29 DIAGNOSIS — M9903 Segmental and somatic dysfunction of lumbar region: Secondary | ICD-10-CM | POA: Diagnosis not present

## 2018-12-29 DIAGNOSIS — M5136 Other intervertebral disc degeneration, lumbar region: Secondary | ICD-10-CM | POA: Diagnosis not present

## 2018-12-29 DIAGNOSIS — M5134 Other intervertebral disc degeneration, thoracic region: Secondary | ICD-10-CM | POA: Diagnosis not present

## 2018-12-31 DIAGNOSIS — Z992 Dependence on renal dialysis: Secondary | ICD-10-CM | POA: Diagnosis not present

## 2018-12-31 DIAGNOSIS — N186 End stage renal disease: Secondary | ICD-10-CM | POA: Diagnosis not present

## 2018-12-31 DIAGNOSIS — I10 Essential (primary) hypertension: Secondary | ICD-10-CM | POA: Diagnosis not present

## 2019-01-01 DIAGNOSIS — D631 Anemia in chronic kidney disease: Secondary | ICD-10-CM | POA: Diagnosis not present

## 2019-01-01 DIAGNOSIS — N2581 Secondary hyperparathyroidism of renal origin: Secondary | ICD-10-CM | POA: Diagnosis not present

## 2019-01-01 DIAGNOSIS — Z23 Encounter for immunization: Secondary | ICD-10-CM | POA: Diagnosis not present

## 2019-01-01 DIAGNOSIS — N186 End stage renal disease: Secondary | ICD-10-CM | POA: Diagnosis not present

## 2019-01-01 DIAGNOSIS — E876 Hypokalemia: Secondary | ICD-10-CM | POA: Diagnosis not present

## 2019-01-03 DIAGNOSIS — M5136 Other intervertebral disc degeneration, lumbar region: Secondary | ICD-10-CM | POA: Diagnosis not present

## 2019-01-03 DIAGNOSIS — M5134 Other intervertebral disc degeneration, thoracic region: Secondary | ICD-10-CM | POA: Diagnosis not present

## 2019-01-03 DIAGNOSIS — M9902 Segmental and somatic dysfunction of thoracic region: Secondary | ICD-10-CM | POA: Diagnosis not present

## 2019-01-03 DIAGNOSIS — M9903 Segmental and somatic dysfunction of lumbar region: Secondary | ICD-10-CM | POA: Diagnosis not present

## 2019-01-03 DIAGNOSIS — M5032 Other cervical disc degeneration, mid-cervical region, unspecified level: Secondary | ICD-10-CM | POA: Diagnosis not present

## 2019-01-03 DIAGNOSIS — M9901 Segmental and somatic dysfunction of cervical region: Secondary | ICD-10-CM | POA: Diagnosis not present

## 2019-01-04 DIAGNOSIS — N186 End stage renal disease: Secondary | ICD-10-CM | POA: Diagnosis not present

## 2019-01-04 DIAGNOSIS — E876 Hypokalemia: Secondary | ICD-10-CM | POA: Diagnosis not present

## 2019-01-04 DIAGNOSIS — N2581 Secondary hyperparathyroidism of renal origin: Secondary | ICD-10-CM | POA: Diagnosis not present

## 2019-01-04 DIAGNOSIS — Z23 Encounter for immunization: Secondary | ICD-10-CM | POA: Diagnosis not present

## 2019-01-04 DIAGNOSIS — D631 Anemia in chronic kidney disease: Secondary | ICD-10-CM | POA: Diagnosis not present

## 2019-01-08 DIAGNOSIS — D631 Anemia in chronic kidney disease: Secondary | ICD-10-CM | POA: Diagnosis not present

## 2019-01-08 DIAGNOSIS — N186 End stage renal disease: Secondary | ICD-10-CM | POA: Diagnosis not present

## 2019-01-08 DIAGNOSIS — N2581 Secondary hyperparathyroidism of renal origin: Secondary | ICD-10-CM | POA: Diagnosis not present

## 2019-01-08 DIAGNOSIS — E876 Hypokalemia: Secondary | ICD-10-CM | POA: Diagnosis not present

## 2019-01-08 DIAGNOSIS — Z23 Encounter for immunization: Secondary | ICD-10-CM | POA: Diagnosis not present

## 2019-01-10 DIAGNOSIS — M9903 Segmental and somatic dysfunction of lumbar region: Secondary | ICD-10-CM | POA: Diagnosis not present

## 2019-01-10 DIAGNOSIS — M5032 Other cervical disc degeneration, mid-cervical region, unspecified level: Secondary | ICD-10-CM | POA: Diagnosis not present

## 2019-01-10 DIAGNOSIS — M5134 Other intervertebral disc degeneration, thoracic region: Secondary | ICD-10-CM | POA: Diagnosis not present

## 2019-01-10 DIAGNOSIS — M9902 Segmental and somatic dysfunction of thoracic region: Secondary | ICD-10-CM | POA: Diagnosis not present

## 2019-01-10 DIAGNOSIS — M5136 Other intervertebral disc degeneration, lumbar region: Secondary | ICD-10-CM | POA: Diagnosis not present

## 2019-01-10 DIAGNOSIS — M9901 Segmental and somatic dysfunction of cervical region: Secondary | ICD-10-CM | POA: Diagnosis not present

## 2019-01-11 DIAGNOSIS — N186 End stage renal disease: Secondary | ICD-10-CM | POA: Diagnosis not present

## 2019-01-11 DIAGNOSIS — N2581 Secondary hyperparathyroidism of renal origin: Secondary | ICD-10-CM | POA: Diagnosis not present

## 2019-01-11 DIAGNOSIS — D631 Anemia in chronic kidney disease: Secondary | ICD-10-CM | POA: Diagnosis not present

## 2019-01-11 DIAGNOSIS — E876 Hypokalemia: Secondary | ICD-10-CM | POA: Diagnosis not present

## 2019-01-11 DIAGNOSIS — Z23 Encounter for immunization: Secondary | ICD-10-CM | POA: Diagnosis not present

## 2019-01-13 DIAGNOSIS — D631 Anemia in chronic kidney disease: Secondary | ICD-10-CM | POA: Diagnosis not present

## 2019-01-13 DIAGNOSIS — N2581 Secondary hyperparathyroidism of renal origin: Secondary | ICD-10-CM | POA: Diagnosis not present

## 2019-01-13 DIAGNOSIS — Z23 Encounter for immunization: Secondary | ICD-10-CM | POA: Diagnosis not present

## 2019-01-13 DIAGNOSIS — N186 End stage renal disease: Secondary | ICD-10-CM | POA: Diagnosis not present

## 2019-01-13 DIAGNOSIS — E876 Hypokalemia: Secondary | ICD-10-CM | POA: Diagnosis not present

## 2019-01-15 DIAGNOSIS — D631 Anemia in chronic kidney disease: Secondary | ICD-10-CM | POA: Diagnosis not present

## 2019-01-15 DIAGNOSIS — Z23 Encounter for immunization: Secondary | ICD-10-CM | POA: Diagnosis not present

## 2019-01-15 DIAGNOSIS — N2581 Secondary hyperparathyroidism of renal origin: Secondary | ICD-10-CM | POA: Diagnosis not present

## 2019-01-15 DIAGNOSIS — E876 Hypokalemia: Secondary | ICD-10-CM | POA: Diagnosis not present

## 2019-01-15 DIAGNOSIS — N186 End stage renal disease: Secondary | ICD-10-CM | POA: Diagnosis not present

## 2019-01-18 DIAGNOSIS — Z23 Encounter for immunization: Secondary | ICD-10-CM | POA: Diagnosis not present

## 2019-01-18 DIAGNOSIS — E876 Hypokalemia: Secondary | ICD-10-CM | POA: Diagnosis not present

## 2019-01-18 DIAGNOSIS — N2581 Secondary hyperparathyroidism of renal origin: Secondary | ICD-10-CM | POA: Diagnosis not present

## 2019-01-18 DIAGNOSIS — D631 Anemia in chronic kidney disease: Secondary | ICD-10-CM | POA: Diagnosis not present

## 2019-01-18 DIAGNOSIS — N186 End stage renal disease: Secondary | ICD-10-CM | POA: Diagnosis not present

## 2019-01-20 DIAGNOSIS — E876 Hypokalemia: Secondary | ICD-10-CM | POA: Diagnosis not present

## 2019-01-20 DIAGNOSIS — N2581 Secondary hyperparathyroidism of renal origin: Secondary | ICD-10-CM | POA: Diagnosis not present

## 2019-01-20 DIAGNOSIS — N186 End stage renal disease: Secondary | ICD-10-CM | POA: Diagnosis not present

## 2019-01-20 DIAGNOSIS — D631 Anemia in chronic kidney disease: Secondary | ICD-10-CM | POA: Diagnosis not present

## 2019-01-20 DIAGNOSIS — Z23 Encounter for immunization: Secondary | ICD-10-CM | POA: Diagnosis not present

## 2019-01-22 DIAGNOSIS — D631 Anemia in chronic kidney disease: Secondary | ICD-10-CM | POA: Diagnosis not present

## 2019-01-22 DIAGNOSIS — N186 End stage renal disease: Secondary | ICD-10-CM | POA: Diagnosis not present

## 2019-01-22 DIAGNOSIS — Z23 Encounter for immunization: Secondary | ICD-10-CM | POA: Diagnosis not present

## 2019-01-22 DIAGNOSIS — N2581 Secondary hyperparathyroidism of renal origin: Secondary | ICD-10-CM | POA: Diagnosis not present

## 2019-01-22 DIAGNOSIS — E876 Hypokalemia: Secondary | ICD-10-CM | POA: Diagnosis not present

## 2019-01-25 DIAGNOSIS — N186 End stage renal disease: Secondary | ICD-10-CM | POA: Diagnosis not present

## 2019-01-25 DIAGNOSIS — E876 Hypokalemia: Secondary | ICD-10-CM | POA: Diagnosis not present

## 2019-01-25 DIAGNOSIS — Z23 Encounter for immunization: Secondary | ICD-10-CM | POA: Diagnosis not present

## 2019-01-25 DIAGNOSIS — N2581 Secondary hyperparathyroidism of renal origin: Secondary | ICD-10-CM | POA: Diagnosis not present

## 2019-01-25 DIAGNOSIS — D631 Anemia in chronic kidney disease: Secondary | ICD-10-CM | POA: Diagnosis not present

## 2019-01-27 DIAGNOSIS — E876 Hypokalemia: Secondary | ICD-10-CM | POA: Diagnosis not present

## 2019-01-27 DIAGNOSIS — N186 End stage renal disease: Secondary | ICD-10-CM | POA: Diagnosis not present

## 2019-01-27 DIAGNOSIS — D631 Anemia in chronic kidney disease: Secondary | ICD-10-CM | POA: Diagnosis not present

## 2019-01-27 DIAGNOSIS — N2581 Secondary hyperparathyroidism of renal origin: Secondary | ICD-10-CM | POA: Diagnosis not present

## 2019-01-27 DIAGNOSIS — Z23 Encounter for immunization: Secondary | ICD-10-CM | POA: Diagnosis not present

## 2019-01-29 DIAGNOSIS — E876 Hypokalemia: Secondary | ICD-10-CM | POA: Diagnosis not present

## 2019-01-29 DIAGNOSIS — D631 Anemia in chronic kidney disease: Secondary | ICD-10-CM | POA: Diagnosis not present

## 2019-01-29 DIAGNOSIS — N186 End stage renal disease: Secondary | ICD-10-CM | POA: Diagnosis not present

## 2019-01-29 DIAGNOSIS — Z23 Encounter for immunization: Secondary | ICD-10-CM | POA: Diagnosis not present

## 2019-01-29 DIAGNOSIS — N2581 Secondary hyperparathyroidism of renal origin: Secondary | ICD-10-CM | POA: Diagnosis not present

## 2019-01-31 DIAGNOSIS — Z992 Dependence on renal dialysis: Secondary | ICD-10-CM | POA: Diagnosis not present

## 2019-01-31 DIAGNOSIS — I10 Essential (primary) hypertension: Secondary | ICD-10-CM | POA: Diagnosis not present

## 2019-01-31 DIAGNOSIS — N186 End stage renal disease: Secondary | ICD-10-CM | POA: Diagnosis not present

## 2019-02-01 DIAGNOSIS — D631 Anemia in chronic kidney disease: Secondary | ICD-10-CM | POA: Diagnosis not present

## 2019-02-01 DIAGNOSIS — N186 End stage renal disease: Secondary | ICD-10-CM | POA: Diagnosis not present

## 2019-02-01 DIAGNOSIS — D509 Iron deficiency anemia, unspecified: Secondary | ICD-10-CM | POA: Diagnosis not present

## 2019-02-01 DIAGNOSIS — Z23 Encounter for immunization: Secondary | ICD-10-CM | POA: Diagnosis not present

## 2019-02-01 DIAGNOSIS — N2581 Secondary hyperparathyroidism of renal origin: Secondary | ICD-10-CM | POA: Diagnosis not present

## 2019-02-01 DIAGNOSIS — E876 Hypokalemia: Secondary | ICD-10-CM | POA: Diagnosis not present

## 2019-02-03 DIAGNOSIS — Z23 Encounter for immunization: Secondary | ICD-10-CM | POA: Diagnosis not present

## 2019-02-03 DIAGNOSIS — N186 End stage renal disease: Secondary | ICD-10-CM | POA: Diagnosis not present

## 2019-02-03 DIAGNOSIS — D509 Iron deficiency anemia, unspecified: Secondary | ICD-10-CM | POA: Diagnosis not present

## 2019-02-03 DIAGNOSIS — N2581 Secondary hyperparathyroidism of renal origin: Secondary | ICD-10-CM | POA: Diagnosis not present

## 2019-02-03 DIAGNOSIS — D631 Anemia in chronic kidney disease: Secondary | ICD-10-CM | POA: Diagnosis not present

## 2019-02-03 DIAGNOSIS — E876 Hypokalemia: Secondary | ICD-10-CM | POA: Diagnosis not present

## 2019-02-05 DIAGNOSIS — N186 End stage renal disease: Secondary | ICD-10-CM | POA: Diagnosis not present

## 2019-02-05 DIAGNOSIS — N2581 Secondary hyperparathyroidism of renal origin: Secondary | ICD-10-CM | POA: Diagnosis not present

## 2019-02-05 DIAGNOSIS — E876 Hypokalemia: Secondary | ICD-10-CM | POA: Diagnosis not present

## 2019-02-05 DIAGNOSIS — D509 Iron deficiency anemia, unspecified: Secondary | ICD-10-CM | POA: Diagnosis not present

## 2019-02-05 DIAGNOSIS — Z23 Encounter for immunization: Secondary | ICD-10-CM | POA: Diagnosis not present

## 2019-02-05 DIAGNOSIS — D631 Anemia in chronic kidney disease: Secondary | ICD-10-CM | POA: Diagnosis not present

## 2019-02-08 DIAGNOSIS — D631 Anemia in chronic kidney disease: Secondary | ICD-10-CM | POA: Diagnosis not present

## 2019-02-08 DIAGNOSIS — N186 End stage renal disease: Secondary | ICD-10-CM | POA: Diagnosis not present

## 2019-02-08 DIAGNOSIS — N2581 Secondary hyperparathyroidism of renal origin: Secondary | ICD-10-CM | POA: Diagnosis not present

## 2019-02-08 DIAGNOSIS — D509 Iron deficiency anemia, unspecified: Secondary | ICD-10-CM | POA: Diagnosis not present

## 2019-02-08 DIAGNOSIS — E876 Hypokalemia: Secondary | ICD-10-CM | POA: Diagnosis not present

## 2019-02-08 DIAGNOSIS — Z23 Encounter for immunization: Secondary | ICD-10-CM | POA: Diagnosis not present

## 2019-02-10 DIAGNOSIS — N186 End stage renal disease: Secondary | ICD-10-CM | POA: Diagnosis not present

## 2019-02-10 DIAGNOSIS — Z23 Encounter for immunization: Secondary | ICD-10-CM | POA: Diagnosis not present

## 2019-02-10 DIAGNOSIS — D509 Iron deficiency anemia, unspecified: Secondary | ICD-10-CM | POA: Diagnosis not present

## 2019-02-10 DIAGNOSIS — D631 Anemia in chronic kidney disease: Secondary | ICD-10-CM | POA: Diagnosis not present

## 2019-02-10 DIAGNOSIS — E876 Hypokalemia: Secondary | ICD-10-CM | POA: Diagnosis not present

## 2019-02-10 DIAGNOSIS — N2581 Secondary hyperparathyroidism of renal origin: Secondary | ICD-10-CM | POA: Diagnosis not present

## 2019-02-12 DIAGNOSIS — Z23 Encounter for immunization: Secondary | ICD-10-CM | POA: Diagnosis not present

## 2019-02-12 DIAGNOSIS — D631 Anemia in chronic kidney disease: Secondary | ICD-10-CM | POA: Diagnosis not present

## 2019-02-12 DIAGNOSIS — N186 End stage renal disease: Secondary | ICD-10-CM | POA: Diagnosis not present

## 2019-02-12 DIAGNOSIS — D509 Iron deficiency anemia, unspecified: Secondary | ICD-10-CM | POA: Diagnosis not present

## 2019-02-12 DIAGNOSIS — E876 Hypokalemia: Secondary | ICD-10-CM | POA: Diagnosis not present

## 2019-02-12 DIAGNOSIS — N2581 Secondary hyperparathyroidism of renal origin: Secondary | ICD-10-CM | POA: Diagnosis not present

## 2019-02-15 DIAGNOSIS — Z23 Encounter for immunization: Secondary | ICD-10-CM | POA: Diagnosis not present

## 2019-02-15 DIAGNOSIS — D509 Iron deficiency anemia, unspecified: Secondary | ICD-10-CM | POA: Diagnosis not present

## 2019-02-15 DIAGNOSIS — N2581 Secondary hyperparathyroidism of renal origin: Secondary | ICD-10-CM | POA: Diagnosis not present

## 2019-02-15 DIAGNOSIS — D631 Anemia in chronic kidney disease: Secondary | ICD-10-CM | POA: Diagnosis not present

## 2019-02-15 DIAGNOSIS — N186 End stage renal disease: Secondary | ICD-10-CM | POA: Diagnosis not present

## 2019-02-15 DIAGNOSIS — E876 Hypokalemia: Secondary | ICD-10-CM | POA: Diagnosis not present

## 2019-02-17 IMAGING — CT CT HEART MORP W/ CTA COR W/ SCORE W/ CA W/CM &/OR W/O CM
4 of 7 series · 8 of 20 positions shown, 9 images · IV contrast (APPLIED)
Comparison: None.

CLINICAL DATA: 57-year-old male with h/o ESRD on HD and recent
hospitalization for chest pain with mildly elevated troponin.

EXAM:
Cardiac/Coronary  CT
TECHNIQUE: The patient was scanned on a [employer] Force scanner.

[Series 6: best diast 73 % · axial · 0.39mm/px · z∈[+1138,+1187]mm · 2 of 372 slices shown, 3 images]
[im 124/372  vessel]
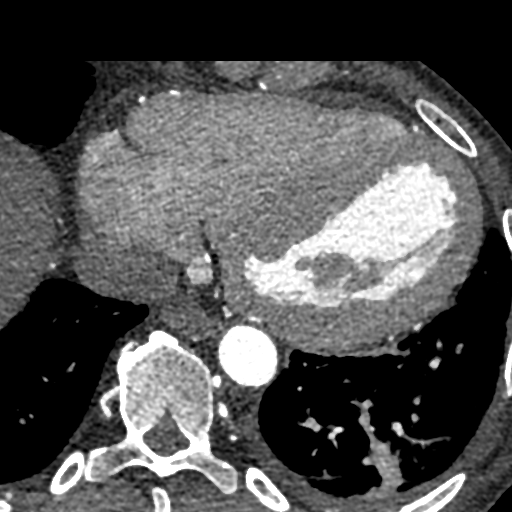
[im 124/372  lung]
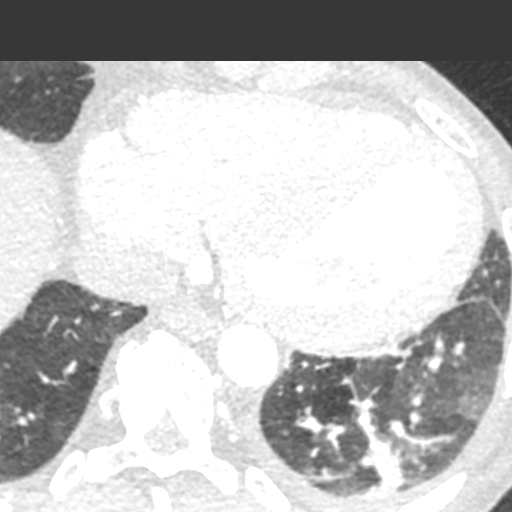
[im 248/372  vessel]
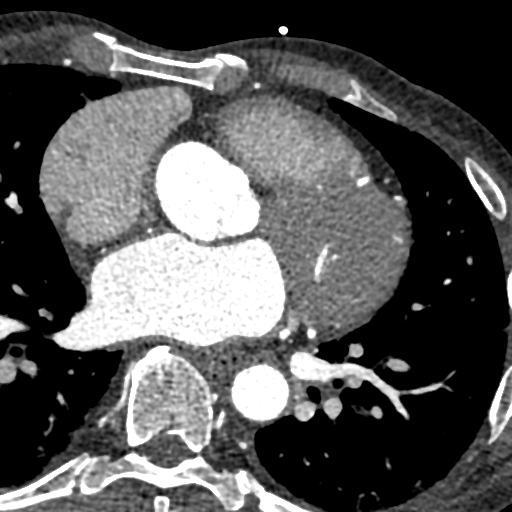

[Series 7: best syst 37 % · axial · 0.39mm/px · z∈[+1138,+1187]mm · 2 of 372 slices shown]
[im 124/372  vessel]
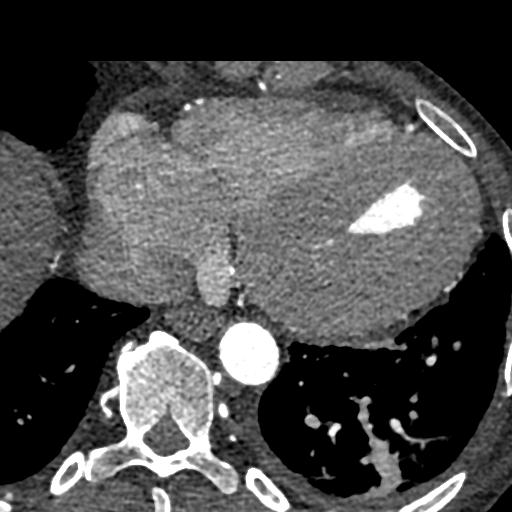
[im 248/372  vessel]
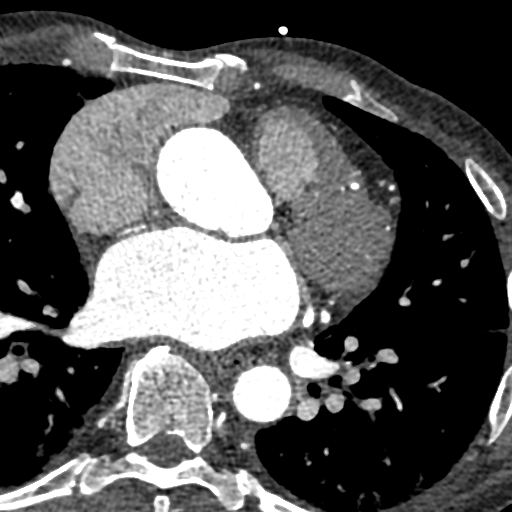

[Series 8: ts diast sharp 73 % · axial · 0.39mm/px · z∈[+1138,+1187]mm · 2 of 372 slices shown]
[im 124/372  lung]
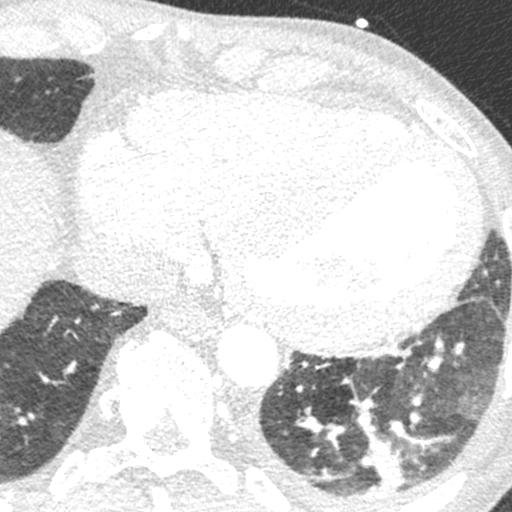
[im 248/372  lung]
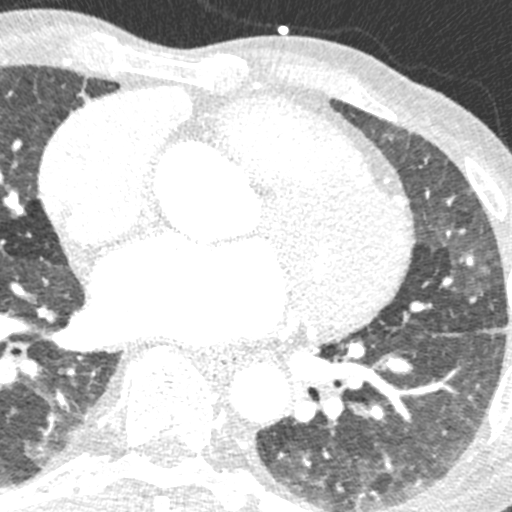

[Series 9: ts syst sharp 37 % · axial · 0.39mm/px · z∈[+1138,+1187]mm · 2 of 372 slices shown]
[im 124/372  lung]
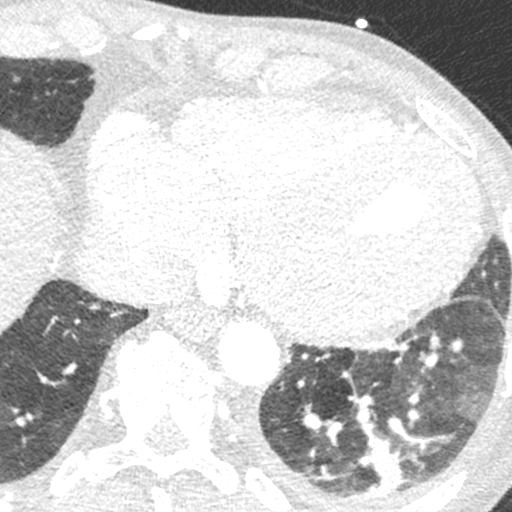
[im 248/372  lung]
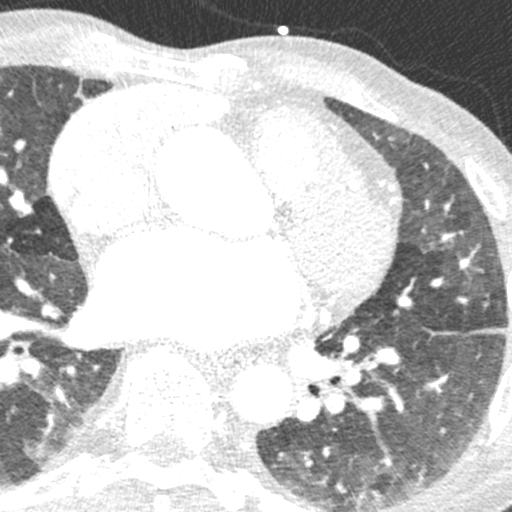

[8 of 20 positions shown; findings below may reference images not displayed]



Aorta: Aneurysmal dilatation of the ascending aorta with maximum
diameter 43 mm, no calcifications and no dissection.

Aortic Valve:  Trileaflet.  No calcifications.

Coronary Arteries:  Normal coronary origin.  Right dominance.

RCA is a large dominant artery that gives rise to PDA and PLVB.
There is minimal plaque with diffuse stenosis 0-25%.

Left main is a large artery that gives rise to LAD and LCX arteries.
Left main has no plaque.

LAD is a large vessel that gives rise to two diagonal arteries and
has minimal diffuse plaque with stenosis 0-25%. There is an
intramyocardial bridge in the proximal to mid LAD.

LCX is a large non-dominant artery that gives rise to two large OM
branches. There is mild calcified plaque in the proximal LCX artery
with associated stenosis 25-50%.

OM1 and OM2 have minimal plaque.

Other findings:

Normal pulmonary vein drainage into the left atrium.

Normal let atrial appendage without a thrombus.

Dilated pulmonary artery measuring 35 mm suggestive of pulmonary
hypertension.
IMPRESSION: 1. Coronary calcium score of 39. This was 57 percentile for age and
sex matched control.

2. Normal coronary origin with right dominance.

3. Mild non-obstructive CAD. There is an intramyocardial bridge in
the proximal to mid LAD.

4. Aneurysmal dilatation of the ascending aorta with maximum
diameter 43 mm, no calcifications and no dissection.

5. Dilated pulmonary artery measuring 35 mm suggestive of pulmonary
hypertension.

EXAM:
OVER-READ INTERPRETATION  CT CHEST

The following report is an over-read performed by radiologist Dr.
Hania Lisandra Cianuro [[email or web address]] on 06/14/2018. This
over-read does not include interpretation of cardiac or coronary
anatomy or pathology. The coronary CTA interpretation by the
cardiologist is attached.
FINDINGS: Vascular: Mild aneurysmal dilatation of the ascending thoracic
aorta, 4.3 cm maximally. Mild cardiomegaly.

Mediastinum/Nodes: No adenopathy in the lower mediastinum or hila.

Lungs/Pleura: Small left pleural effusion. Bibasilar atelectasis,
left greater than right. Scattered ground-glass opacities could
reflect early edema or atelectasis.

Upper Abdomen: Imaging into the upper abdomen shows no acute
findings.

Musculoskeletal: Chest wall soft tissues are unremarkable. No acute
bony abnormality.
IMPRESSION: Cardiomegaly. Scattered ground-glass opacities in the lungs could
reflect early edema or atelectasis. Areas of bibasilar atelectasis,
left greater than right.

Small left effusion.

4.3 cm ascending thoracic aortic aneurysm. Recommend annual imaging
followup by CTA or MRA. This recommendation follows 2202
ACCF/AHA/AATS/ACR/ASA/SCA/NALIVAN/BATTISTE/WEITZMAN/BRYNMOR Guidelines for the
Diagnosis and Management of Patients with Thoracic Aortic Disease.
Circulation. 2202; 121: [id number]

## 2019-02-19 DIAGNOSIS — E876 Hypokalemia: Secondary | ICD-10-CM | POA: Diagnosis not present

## 2019-02-19 DIAGNOSIS — N186 End stage renal disease: Secondary | ICD-10-CM | POA: Diagnosis not present

## 2019-02-19 DIAGNOSIS — D509 Iron deficiency anemia, unspecified: Secondary | ICD-10-CM | POA: Diagnosis not present

## 2019-02-19 DIAGNOSIS — N2581 Secondary hyperparathyroidism of renal origin: Secondary | ICD-10-CM | POA: Diagnosis not present

## 2019-02-19 DIAGNOSIS — Z23 Encounter for immunization: Secondary | ICD-10-CM | POA: Diagnosis not present

## 2019-02-19 DIAGNOSIS — D631 Anemia in chronic kidney disease: Secondary | ICD-10-CM | POA: Diagnosis not present

## 2019-02-22 DIAGNOSIS — D509 Iron deficiency anemia, unspecified: Secondary | ICD-10-CM | POA: Diagnosis not present

## 2019-02-22 DIAGNOSIS — E876 Hypokalemia: Secondary | ICD-10-CM | POA: Diagnosis not present

## 2019-02-22 DIAGNOSIS — Z23 Encounter for immunization: Secondary | ICD-10-CM | POA: Diagnosis not present

## 2019-02-22 DIAGNOSIS — N186 End stage renal disease: Secondary | ICD-10-CM | POA: Diagnosis not present

## 2019-02-22 DIAGNOSIS — N2581 Secondary hyperparathyroidism of renal origin: Secondary | ICD-10-CM | POA: Diagnosis not present

## 2019-02-22 DIAGNOSIS — D631 Anemia in chronic kidney disease: Secondary | ICD-10-CM | POA: Diagnosis not present

## 2019-02-26 DIAGNOSIS — D631 Anemia in chronic kidney disease: Secondary | ICD-10-CM | POA: Diagnosis not present

## 2019-02-26 DIAGNOSIS — D509 Iron deficiency anemia, unspecified: Secondary | ICD-10-CM | POA: Diagnosis not present

## 2019-02-26 DIAGNOSIS — E876 Hypokalemia: Secondary | ICD-10-CM | POA: Diagnosis not present

## 2019-02-26 DIAGNOSIS — N186 End stage renal disease: Secondary | ICD-10-CM | POA: Diagnosis not present

## 2019-02-26 DIAGNOSIS — N2581 Secondary hyperparathyroidism of renal origin: Secondary | ICD-10-CM | POA: Diagnosis not present

## 2019-02-26 DIAGNOSIS — Z23 Encounter for immunization: Secondary | ICD-10-CM | POA: Diagnosis not present

## 2019-03-01 DIAGNOSIS — N186 End stage renal disease: Secondary | ICD-10-CM | POA: Diagnosis not present

## 2019-03-01 DIAGNOSIS — D509 Iron deficiency anemia, unspecified: Secondary | ICD-10-CM | POA: Diagnosis not present

## 2019-03-01 DIAGNOSIS — D631 Anemia in chronic kidney disease: Secondary | ICD-10-CM | POA: Diagnosis not present

## 2019-03-01 DIAGNOSIS — Z23 Encounter for immunization: Secondary | ICD-10-CM | POA: Diagnosis not present

## 2019-03-01 DIAGNOSIS — E876 Hypokalemia: Secondary | ICD-10-CM | POA: Diagnosis not present

## 2019-03-01 DIAGNOSIS — N2581 Secondary hyperparathyroidism of renal origin: Secondary | ICD-10-CM | POA: Diagnosis not present

## 2019-03-02 DIAGNOSIS — N186 End stage renal disease: Secondary | ICD-10-CM | POA: Diagnosis not present

## 2019-03-02 DIAGNOSIS — Z992 Dependence on renal dialysis: Secondary | ICD-10-CM | POA: Diagnosis not present

## 2019-03-02 DIAGNOSIS — I10 Essential (primary) hypertension: Secondary | ICD-10-CM | POA: Diagnosis not present

## 2019-03-03 DIAGNOSIS — D631 Anemia in chronic kidney disease: Secondary | ICD-10-CM | POA: Diagnosis not present

## 2019-03-03 DIAGNOSIS — N186 End stage renal disease: Secondary | ICD-10-CM | POA: Diagnosis not present

## 2019-03-03 DIAGNOSIS — E876 Hypokalemia: Secondary | ICD-10-CM | POA: Diagnosis not present

## 2019-03-03 DIAGNOSIS — D509 Iron deficiency anemia, unspecified: Secondary | ICD-10-CM | POA: Diagnosis not present

## 2019-03-03 DIAGNOSIS — N2581 Secondary hyperparathyroidism of renal origin: Secondary | ICD-10-CM | POA: Diagnosis not present

## 2019-03-05 DIAGNOSIS — D509 Iron deficiency anemia, unspecified: Secondary | ICD-10-CM | POA: Diagnosis not present

## 2019-03-05 DIAGNOSIS — E876 Hypokalemia: Secondary | ICD-10-CM | POA: Diagnosis not present

## 2019-03-05 DIAGNOSIS — N2581 Secondary hyperparathyroidism of renal origin: Secondary | ICD-10-CM | POA: Diagnosis not present

## 2019-03-05 DIAGNOSIS — D631 Anemia in chronic kidney disease: Secondary | ICD-10-CM | POA: Diagnosis not present

## 2019-03-05 DIAGNOSIS — N186 End stage renal disease: Secondary | ICD-10-CM | POA: Diagnosis not present

## 2019-03-07 DIAGNOSIS — E876 Hypokalemia: Secondary | ICD-10-CM | POA: Diagnosis not present

## 2019-03-07 DIAGNOSIS — N186 End stage renal disease: Secondary | ICD-10-CM | POA: Diagnosis not present

## 2019-03-07 DIAGNOSIS — N2581 Secondary hyperparathyroidism of renal origin: Secondary | ICD-10-CM | POA: Diagnosis not present

## 2019-03-07 DIAGNOSIS — D509 Iron deficiency anemia, unspecified: Secondary | ICD-10-CM | POA: Diagnosis not present

## 2019-03-07 DIAGNOSIS — D631 Anemia in chronic kidney disease: Secondary | ICD-10-CM | POA: Diagnosis not present

## 2019-03-10 DIAGNOSIS — N186 End stage renal disease: Secondary | ICD-10-CM | POA: Diagnosis not present

## 2019-03-10 DIAGNOSIS — N2581 Secondary hyperparathyroidism of renal origin: Secondary | ICD-10-CM | POA: Diagnosis not present

## 2019-03-10 DIAGNOSIS — D509 Iron deficiency anemia, unspecified: Secondary | ICD-10-CM | POA: Diagnosis not present

## 2019-03-10 DIAGNOSIS — E876 Hypokalemia: Secondary | ICD-10-CM | POA: Diagnosis not present

## 2019-03-10 DIAGNOSIS — D631 Anemia in chronic kidney disease: Secondary | ICD-10-CM | POA: Diagnosis not present

## 2019-03-11 IMAGING — CT CT HEAD W/O CM
3 series · 15 of 47 positions shown, 18 images · non-contrast
Comparison: Multiple exams, including 04/09/2015

CLINICAL DATA: Drowsiness and fatigue with altered level of
consciousness after dialysis.

EXAM:
CT HEAD WITHOUT CONTRAST
TECHNIQUE: Contiguous axial images were obtained from the base of the skull
through the vertex without intravenous contrast.

[Series 3: head 5.0 h30s · axial · 0.42mm/px · z∈[-106,+34]mm · 9 of 34 slices shown, 12 images]
[im 3/34  brain]
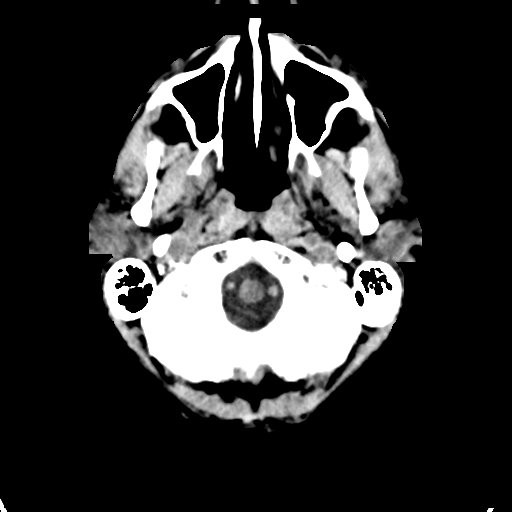
[im 3/34  bone]
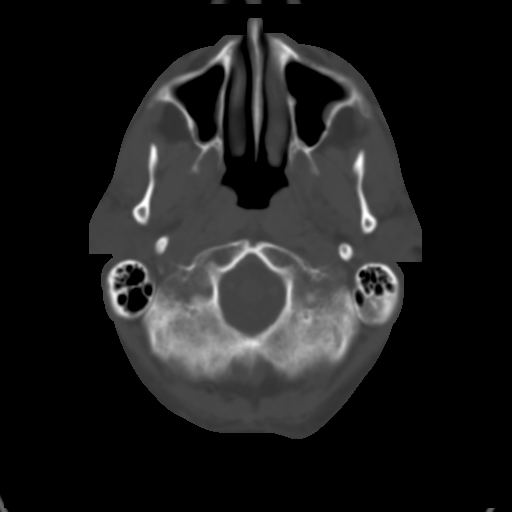
[im 6/34  brain]
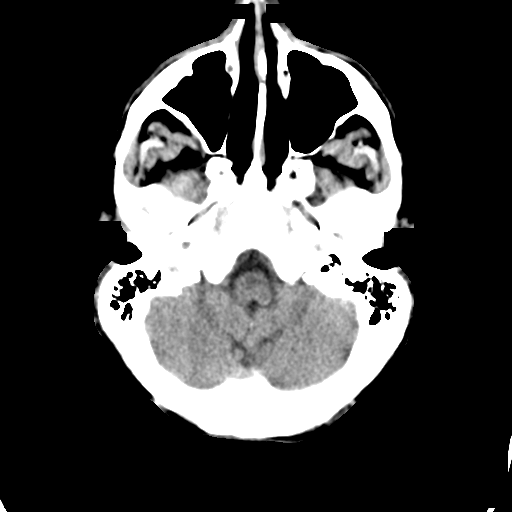
[im 10/34  brain]
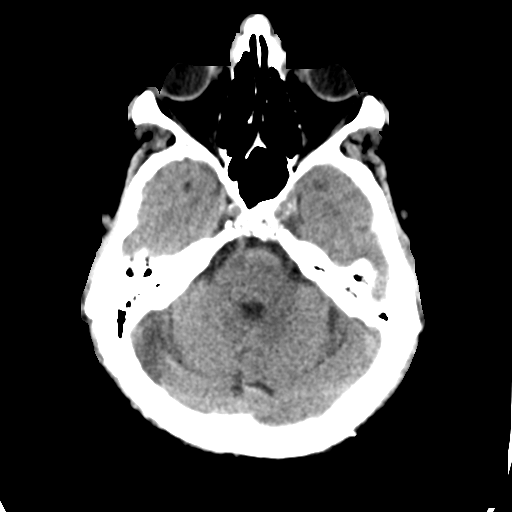
[im 13/34  brain]
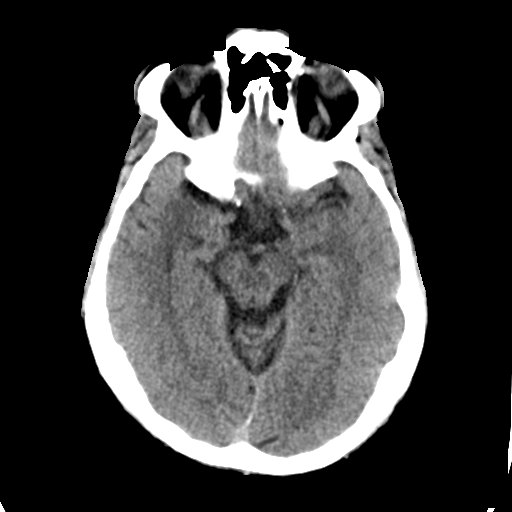
[im 18/34  brain]
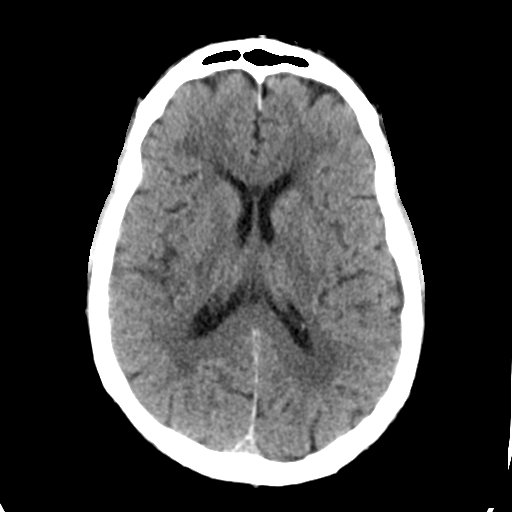
[im 18/34  bone]
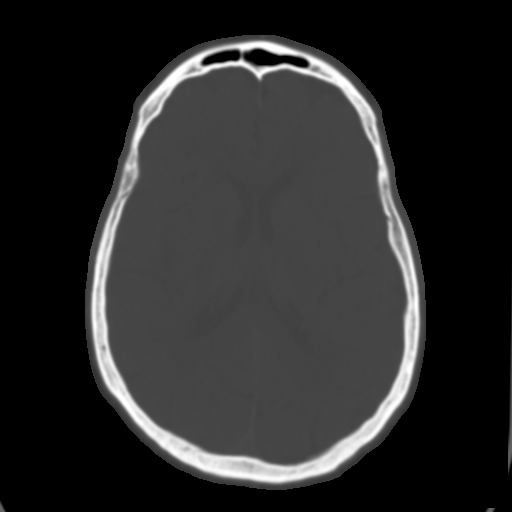
[im 21/34  brain]
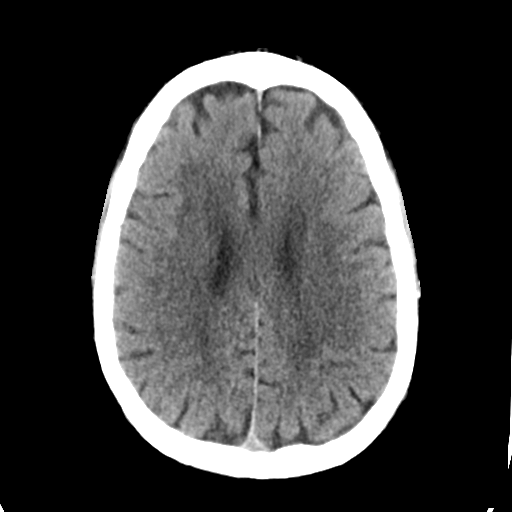
[im 24/34  brain]
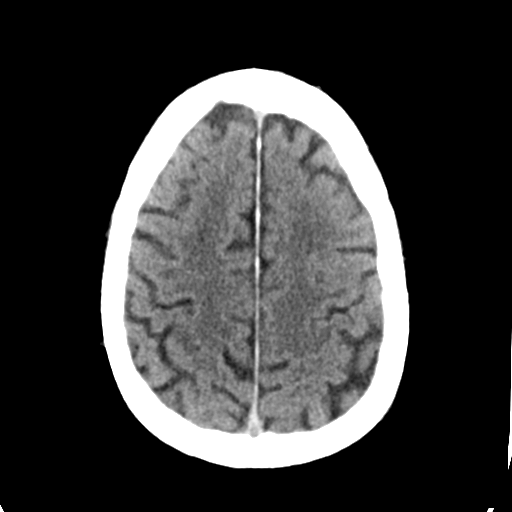
[im 28/34  brain]
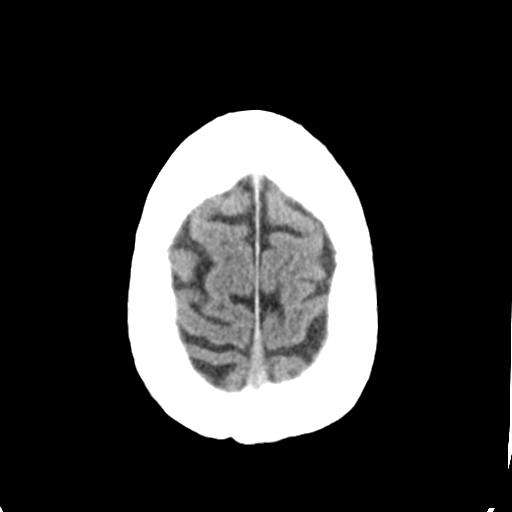
[im 31/34  brain]
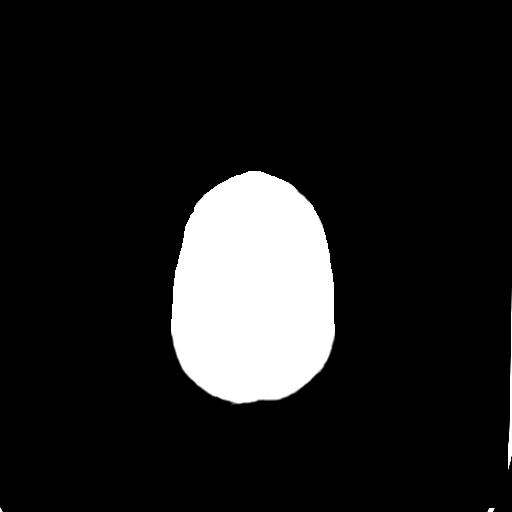
[im 31/34  bone]
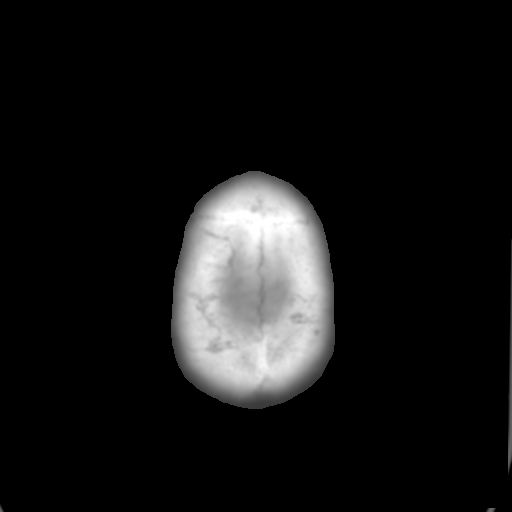

[Series 5: head 3.0 mpr cor · coronal · 0.33mm/px · 3 of 67 slices shown]
[im 23/67  brain]
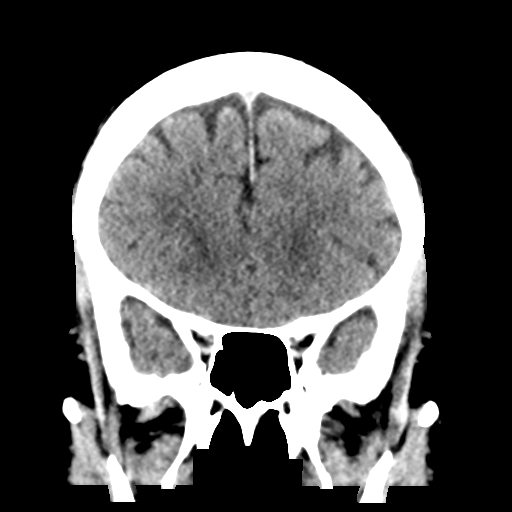
[im 30/67  brain]
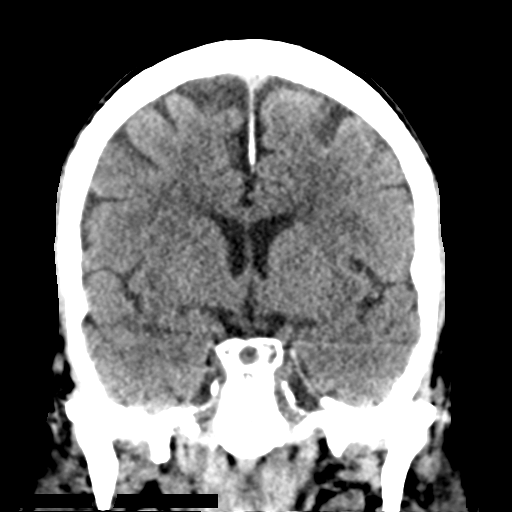
[im 37/67  brain]
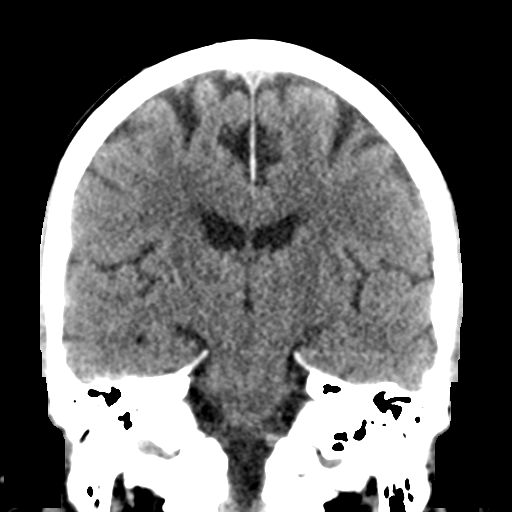

[Series 6: head 3.0 mpr sag · sagittal · 0.32mm/px · 3 of 48 slices shown]
[im 16/48  brain]
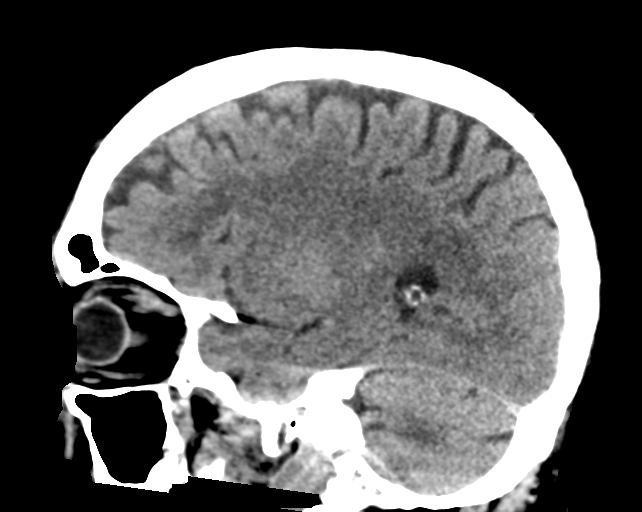
[im 24/48  brain]
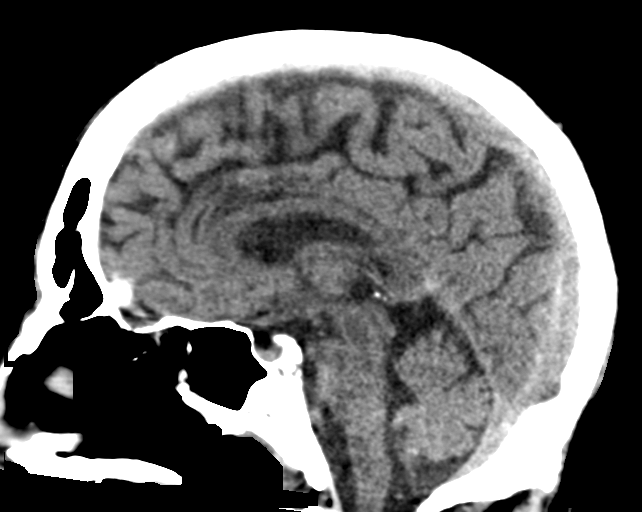
[im 32/48  brain]
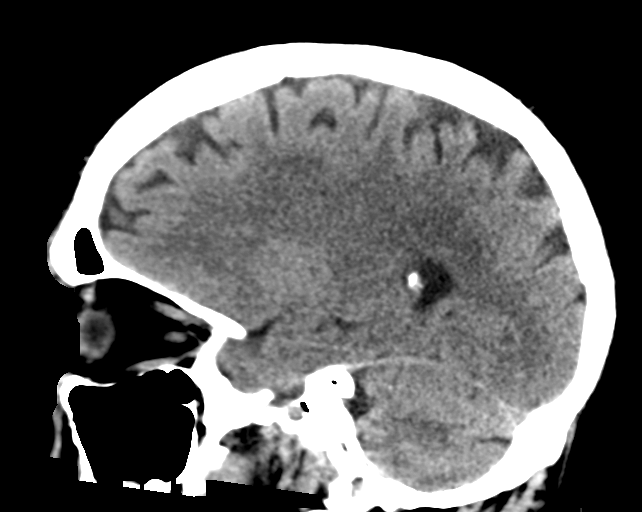

[15 of 47 positions shown; findings below may reference images not displayed]

FINDINGS: Brain: Small remote infarcts in the right thalamus and right
periventricular white matter. There is potentially a small remote
left anterior thalamic lacunar infarct.

Periventricular white matter and corona radiata hypodensities favor
chronic ischemic microvascular white matter disease.

Otherwise, the brainstem, cerebellum, cerebral peduncles, thalami,
basal ganglia, basilar cisterns, and ventricular system appear
within normal limits. No intracranial hemorrhage, mass lesion, or
acute CVA.

Vascular: There is atherosclerotic calcification of the cavernous
carotid arteries bilaterally.

Skull: Unremarkable

Sinuses/Orbits: Mild chronic bilateral maxillary sinusitis.

Other: No supplemental non-categorized findings.
IMPRESSION: 1. No acute intracranial findings.
2. Small remote thalamic and right periventricular white matter
lacunar infarcts.
3. Periventricular white matter and corona radiata hypodensities
favor chronic ischemic microvascular white matter disease.
4. Mild chronic bilateral maxillary sinusitis.

## 2019-03-12 DIAGNOSIS — E876 Hypokalemia: Secondary | ICD-10-CM | POA: Diagnosis not present

## 2019-03-12 DIAGNOSIS — D631 Anemia in chronic kidney disease: Secondary | ICD-10-CM | POA: Diagnosis not present

## 2019-03-12 DIAGNOSIS — N2581 Secondary hyperparathyroidism of renal origin: Secondary | ICD-10-CM | POA: Diagnosis not present

## 2019-03-12 DIAGNOSIS — D509 Iron deficiency anemia, unspecified: Secondary | ICD-10-CM | POA: Diagnosis not present

## 2019-03-12 DIAGNOSIS — N186 End stage renal disease: Secondary | ICD-10-CM | POA: Diagnosis not present

## 2019-03-15 DIAGNOSIS — N186 End stage renal disease: Secondary | ICD-10-CM | POA: Diagnosis not present

## 2019-03-15 DIAGNOSIS — D631 Anemia in chronic kidney disease: Secondary | ICD-10-CM | POA: Diagnosis not present

## 2019-03-15 DIAGNOSIS — D509 Iron deficiency anemia, unspecified: Secondary | ICD-10-CM | POA: Diagnosis not present

## 2019-03-15 DIAGNOSIS — N2581 Secondary hyperparathyroidism of renal origin: Secondary | ICD-10-CM | POA: Diagnosis not present

## 2019-03-15 DIAGNOSIS — E876 Hypokalemia: Secondary | ICD-10-CM | POA: Diagnosis not present

## 2019-03-17 DIAGNOSIS — D509 Iron deficiency anemia, unspecified: Secondary | ICD-10-CM | POA: Diagnosis not present

## 2019-03-17 DIAGNOSIS — N186 End stage renal disease: Secondary | ICD-10-CM | POA: Diagnosis not present

## 2019-03-17 DIAGNOSIS — E876 Hypokalemia: Secondary | ICD-10-CM | POA: Diagnosis not present

## 2019-03-17 DIAGNOSIS — D631 Anemia in chronic kidney disease: Secondary | ICD-10-CM | POA: Diagnosis not present

## 2019-03-17 DIAGNOSIS — N2581 Secondary hyperparathyroidism of renal origin: Secondary | ICD-10-CM | POA: Diagnosis not present

## 2019-03-19 DIAGNOSIS — N2581 Secondary hyperparathyroidism of renal origin: Secondary | ICD-10-CM | POA: Diagnosis not present

## 2019-03-19 DIAGNOSIS — E876 Hypokalemia: Secondary | ICD-10-CM | POA: Diagnosis not present

## 2019-03-19 DIAGNOSIS — D509 Iron deficiency anemia, unspecified: Secondary | ICD-10-CM | POA: Diagnosis not present

## 2019-03-19 DIAGNOSIS — D631 Anemia in chronic kidney disease: Secondary | ICD-10-CM | POA: Diagnosis not present

## 2019-03-19 DIAGNOSIS — N186 End stage renal disease: Secondary | ICD-10-CM | POA: Diagnosis not present

## 2019-03-22 DIAGNOSIS — N186 End stage renal disease: Secondary | ICD-10-CM | POA: Diagnosis not present

## 2019-03-22 DIAGNOSIS — D631 Anemia in chronic kidney disease: Secondary | ICD-10-CM | POA: Diagnosis not present

## 2019-03-22 DIAGNOSIS — E876 Hypokalemia: Secondary | ICD-10-CM | POA: Diagnosis not present

## 2019-03-22 DIAGNOSIS — N2581 Secondary hyperparathyroidism of renal origin: Secondary | ICD-10-CM | POA: Diagnosis not present

## 2019-03-22 DIAGNOSIS — D509 Iron deficiency anemia, unspecified: Secondary | ICD-10-CM | POA: Diagnosis not present

## 2019-03-24 DIAGNOSIS — N186 End stage renal disease: Secondary | ICD-10-CM | POA: Diagnosis not present

## 2019-03-24 DIAGNOSIS — D631 Anemia in chronic kidney disease: Secondary | ICD-10-CM | POA: Diagnosis not present

## 2019-03-24 DIAGNOSIS — E876 Hypokalemia: Secondary | ICD-10-CM | POA: Diagnosis not present

## 2019-03-24 DIAGNOSIS — D509 Iron deficiency anemia, unspecified: Secondary | ICD-10-CM | POA: Diagnosis not present

## 2019-03-24 DIAGNOSIS — N2581 Secondary hyperparathyroidism of renal origin: Secondary | ICD-10-CM | POA: Diagnosis not present

## 2019-03-26 DIAGNOSIS — D509 Iron deficiency anemia, unspecified: Secondary | ICD-10-CM | POA: Diagnosis not present

## 2019-03-26 DIAGNOSIS — D631 Anemia in chronic kidney disease: Secondary | ICD-10-CM | POA: Diagnosis not present

## 2019-03-26 DIAGNOSIS — N186 End stage renal disease: Secondary | ICD-10-CM | POA: Diagnosis not present

## 2019-03-26 DIAGNOSIS — E876 Hypokalemia: Secondary | ICD-10-CM | POA: Diagnosis not present

## 2019-03-26 DIAGNOSIS — N2581 Secondary hyperparathyroidism of renal origin: Secondary | ICD-10-CM | POA: Diagnosis not present

## 2019-03-29 DIAGNOSIS — D631 Anemia in chronic kidney disease: Secondary | ICD-10-CM | POA: Diagnosis not present

## 2019-03-29 DIAGNOSIS — N2581 Secondary hyperparathyroidism of renal origin: Secondary | ICD-10-CM | POA: Diagnosis not present

## 2019-03-29 DIAGNOSIS — E876 Hypokalemia: Secondary | ICD-10-CM | POA: Diagnosis not present

## 2019-03-29 DIAGNOSIS — D509 Iron deficiency anemia, unspecified: Secondary | ICD-10-CM | POA: Diagnosis not present

## 2019-03-29 DIAGNOSIS — N186 End stage renal disease: Secondary | ICD-10-CM | POA: Diagnosis not present

## 2019-03-30 DIAGNOSIS — M5010 Cervical disc disorder with radiculopathy, unspecified cervical region: Secondary | ICD-10-CM | POA: Diagnosis not present

## 2019-03-30 DIAGNOSIS — M9901 Segmental and somatic dysfunction of cervical region: Secondary | ICD-10-CM | POA: Diagnosis not present

## 2019-03-30 DIAGNOSIS — M545 Low back pain: Secondary | ICD-10-CM | POA: Diagnosis not present

## 2019-03-30 DIAGNOSIS — M542 Cervicalgia: Secondary | ICD-10-CM | POA: Diagnosis not present

## 2019-03-31 DIAGNOSIS — D631 Anemia in chronic kidney disease: Secondary | ICD-10-CM | POA: Diagnosis not present

## 2019-03-31 DIAGNOSIS — N186 End stage renal disease: Secondary | ICD-10-CM | POA: Diagnosis not present

## 2019-03-31 DIAGNOSIS — N2581 Secondary hyperparathyroidism of renal origin: Secondary | ICD-10-CM | POA: Diagnosis not present

## 2019-03-31 DIAGNOSIS — G2581 Restless legs syndrome: Secondary | ICD-10-CM | POA: Diagnosis not present

## 2019-03-31 DIAGNOSIS — G5601 Carpal tunnel syndrome, right upper limb: Secondary | ICD-10-CM | POA: Diagnosis not present

## 2019-03-31 DIAGNOSIS — M5412 Radiculopathy, cervical region: Secondary | ICD-10-CM | POA: Diagnosis not present

## 2019-03-31 DIAGNOSIS — I2510 Atherosclerotic heart disease of native coronary artery without angina pectoris: Secondary | ICD-10-CM | POA: Diagnosis not present

## 2019-03-31 DIAGNOSIS — Z992 Dependence on renal dialysis: Secondary | ICD-10-CM | POA: Diagnosis not present

## 2019-03-31 DIAGNOSIS — F5101 Primary insomnia: Secondary | ICD-10-CM | POA: Diagnosis not present

## 2019-03-31 DIAGNOSIS — K219 Gastro-esophageal reflux disease without esophagitis: Secondary | ICD-10-CM | POA: Diagnosis not present

## 2019-03-31 DIAGNOSIS — D509 Iron deficiency anemia, unspecified: Secondary | ICD-10-CM | POA: Diagnosis not present

## 2019-03-31 DIAGNOSIS — I5042 Chronic combined systolic (congestive) and diastolic (congestive) heart failure: Secondary | ICD-10-CM | POA: Diagnosis not present

## 2019-03-31 DIAGNOSIS — I150 Renovascular hypertension: Secondary | ICD-10-CM | POA: Diagnosis not present

## 2019-03-31 DIAGNOSIS — M542 Cervicalgia: Secondary | ICD-10-CM | POA: Diagnosis not present

## 2019-03-31 DIAGNOSIS — E876 Hypokalemia: Secondary | ICD-10-CM | POA: Diagnosis not present

## 2019-04-01 ENCOUNTER — Other Ambulatory Visit: Payer: Self-pay | Admitting: Family Medicine

## 2019-04-01 DIAGNOSIS — M542 Cervicalgia: Secondary | ICD-10-CM

## 2019-04-02 DIAGNOSIS — N186 End stage renal disease: Secondary | ICD-10-CM | POA: Diagnosis not present

## 2019-04-02 DIAGNOSIS — I10 Essential (primary) hypertension: Secondary | ICD-10-CM | POA: Diagnosis not present

## 2019-04-02 DIAGNOSIS — N2581 Secondary hyperparathyroidism of renal origin: Secondary | ICD-10-CM | POA: Diagnosis not present

## 2019-04-02 DIAGNOSIS — E876 Hypokalemia: Secondary | ICD-10-CM | POA: Diagnosis not present

## 2019-04-02 DIAGNOSIS — Z992 Dependence on renal dialysis: Secondary | ICD-10-CM | POA: Diagnosis not present

## 2019-04-02 DIAGNOSIS — D631 Anemia in chronic kidney disease: Secondary | ICD-10-CM | POA: Diagnosis not present

## 2019-04-02 DIAGNOSIS — C44619 Basal cell carcinoma of skin of left upper limb, including shoulder: Secondary | ICD-10-CM | POA: Diagnosis not present

## 2019-04-02 DIAGNOSIS — D509 Iron deficiency anemia, unspecified: Secondary | ICD-10-CM | POA: Diagnosis not present

## 2019-04-05 DIAGNOSIS — N2581 Secondary hyperparathyroidism of renal origin: Secondary | ICD-10-CM | POA: Diagnosis not present

## 2019-04-05 DIAGNOSIS — E876 Hypokalemia: Secondary | ICD-10-CM | POA: Diagnosis not present

## 2019-04-05 DIAGNOSIS — D631 Anemia in chronic kidney disease: Secondary | ICD-10-CM | POA: Diagnosis not present

## 2019-04-05 DIAGNOSIS — C44619 Basal cell carcinoma of skin of left upper limb, including shoulder: Secondary | ICD-10-CM | POA: Diagnosis not present

## 2019-04-05 DIAGNOSIS — D509 Iron deficiency anemia, unspecified: Secondary | ICD-10-CM | POA: Diagnosis not present

## 2019-04-05 DIAGNOSIS — N186 End stage renal disease: Secondary | ICD-10-CM | POA: Diagnosis not present

## 2019-04-05 DIAGNOSIS — Z992 Dependence on renal dialysis: Secondary | ICD-10-CM | POA: Diagnosis not present

## 2019-04-07 DIAGNOSIS — M5412 Radiculopathy, cervical region: Secondary | ICD-10-CM | POA: Diagnosis not present

## 2019-04-07 DIAGNOSIS — E876 Hypokalemia: Secondary | ICD-10-CM | POA: Diagnosis not present

## 2019-04-07 DIAGNOSIS — Z992 Dependence on renal dialysis: Secondary | ICD-10-CM | POA: Diagnosis not present

## 2019-04-07 DIAGNOSIS — D631 Anemia in chronic kidney disease: Secondary | ICD-10-CM | POA: Diagnosis not present

## 2019-04-07 DIAGNOSIS — I129 Hypertensive chronic kidney disease with stage 1 through stage 4 chronic kidney disease, or unspecified chronic kidney disease: Secondary | ICD-10-CM | POA: Diagnosis not present

## 2019-04-07 DIAGNOSIS — N2581 Secondary hyperparathyroidism of renal origin: Secondary | ICD-10-CM | POA: Diagnosis not present

## 2019-04-07 DIAGNOSIS — M25512 Pain in left shoulder: Secondary | ICD-10-CM | POA: Diagnosis not present

## 2019-04-07 DIAGNOSIS — N186 End stage renal disease: Secondary | ICD-10-CM | POA: Diagnosis not present

## 2019-04-07 DIAGNOSIS — G8929 Other chronic pain: Secondary | ICD-10-CM | POA: Diagnosis not present

## 2019-04-07 DIAGNOSIS — D509 Iron deficiency anemia, unspecified: Secondary | ICD-10-CM | POA: Diagnosis not present

## 2019-04-09 DIAGNOSIS — D509 Iron deficiency anemia, unspecified: Secondary | ICD-10-CM | POA: Diagnosis not present

## 2019-04-09 DIAGNOSIS — Z992 Dependence on renal dialysis: Secondary | ICD-10-CM | POA: Diagnosis not present

## 2019-04-09 DIAGNOSIS — E876 Hypokalemia: Secondary | ICD-10-CM | POA: Diagnosis not present

## 2019-04-09 DIAGNOSIS — N2581 Secondary hyperparathyroidism of renal origin: Secondary | ICD-10-CM | POA: Diagnosis not present

## 2019-04-09 DIAGNOSIS — N186 End stage renal disease: Secondary | ICD-10-CM | POA: Diagnosis not present

## 2019-04-09 DIAGNOSIS — D631 Anemia in chronic kidney disease: Secondary | ICD-10-CM | POA: Diagnosis not present

## 2019-04-12 DIAGNOSIS — D509 Iron deficiency anemia, unspecified: Secondary | ICD-10-CM | POA: Diagnosis not present

## 2019-04-12 DIAGNOSIS — N186 End stage renal disease: Secondary | ICD-10-CM | POA: Diagnosis not present

## 2019-04-12 DIAGNOSIS — E876 Hypokalemia: Secondary | ICD-10-CM | POA: Diagnosis not present

## 2019-04-12 DIAGNOSIS — N2581 Secondary hyperparathyroidism of renal origin: Secondary | ICD-10-CM | POA: Diagnosis not present

## 2019-04-12 DIAGNOSIS — D631 Anemia in chronic kidney disease: Secondary | ICD-10-CM | POA: Diagnosis not present

## 2019-04-12 DIAGNOSIS — Z992 Dependence on renal dialysis: Secondary | ICD-10-CM | POA: Diagnosis not present

## 2019-04-14 DIAGNOSIS — Z992 Dependence on renal dialysis: Secondary | ICD-10-CM | POA: Diagnosis not present

## 2019-04-14 DIAGNOSIS — N2581 Secondary hyperparathyroidism of renal origin: Secondary | ICD-10-CM | POA: Diagnosis not present

## 2019-04-14 DIAGNOSIS — N186 End stage renal disease: Secondary | ICD-10-CM | POA: Diagnosis not present

## 2019-04-14 DIAGNOSIS — E876 Hypokalemia: Secondary | ICD-10-CM | POA: Diagnosis not present

## 2019-04-14 DIAGNOSIS — D509 Iron deficiency anemia, unspecified: Secondary | ICD-10-CM | POA: Diagnosis not present

## 2019-04-14 DIAGNOSIS — D631 Anemia in chronic kidney disease: Secondary | ICD-10-CM | POA: Diagnosis not present

## 2019-04-16 DIAGNOSIS — Z992 Dependence on renal dialysis: Secondary | ICD-10-CM | POA: Diagnosis not present

## 2019-04-16 DIAGNOSIS — N186 End stage renal disease: Secondary | ICD-10-CM | POA: Diagnosis not present

## 2019-04-16 DIAGNOSIS — N2581 Secondary hyperparathyroidism of renal origin: Secondary | ICD-10-CM | POA: Diagnosis not present

## 2019-04-16 DIAGNOSIS — D631 Anemia in chronic kidney disease: Secondary | ICD-10-CM | POA: Diagnosis not present

## 2019-04-16 DIAGNOSIS — E876 Hypokalemia: Secondary | ICD-10-CM | POA: Diagnosis not present

## 2019-04-16 DIAGNOSIS — D509 Iron deficiency anemia, unspecified: Secondary | ICD-10-CM | POA: Diagnosis not present

## 2019-04-19 DIAGNOSIS — E876 Hypokalemia: Secondary | ICD-10-CM | POA: Diagnosis not present

## 2019-04-19 DIAGNOSIS — D631 Anemia in chronic kidney disease: Secondary | ICD-10-CM | POA: Diagnosis not present

## 2019-04-19 DIAGNOSIS — Z992 Dependence on renal dialysis: Secondary | ICD-10-CM | POA: Diagnosis not present

## 2019-04-19 DIAGNOSIS — D509 Iron deficiency anemia, unspecified: Secondary | ICD-10-CM | POA: Diagnosis not present

## 2019-04-19 DIAGNOSIS — N2581 Secondary hyperparathyroidism of renal origin: Secondary | ICD-10-CM | POA: Diagnosis not present

## 2019-04-19 DIAGNOSIS — N186 End stage renal disease: Secondary | ICD-10-CM | POA: Diagnosis not present

## 2019-04-21 DIAGNOSIS — D509 Iron deficiency anemia, unspecified: Secondary | ICD-10-CM | POA: Diagnosis not present

## 2019-04-21 DIAGNOSIS — N2581 Secondary hyperparathyroidism of renal origin: Secondary | ICD-10-CM | POA: Diagnosis not present

## 2019-04-21 DIAGNOSIS — H25041 Posterior subcapsular polar age-related cataract, right eye: Secondary | ICD-10-CM | POA: Diagnosis not present

## 2019-04-21 DIAGNOSIS — Z992 Dependence on renal dialysis: Secondary | ICD-10-CM | POA: Diagnosis not present

## 2019-04-21 DIAGNOSIS — N186 End stage renal disease: Secondary | ICD-10-CM | POA: Diagnosis not present

## 2019-04-21 DIAGNOSIS — E876 Hypokalemia: Secondary | ICD-10-CM | POA: Diagnosis not present

## 2019-04-21 DIAGNOSIS — D631 Anemia in chronic kidney disease: Secondary | ICD-10-CM | POA: Diagnosis not present

## 2019-04-21 DIAGNOSIS — H25012 Cortical age-related cataract, left eye: Secondary | ICD-10-CM | POA: Diagnosis not present

## 2019-04-21 DIAGNOSIS — H2512 Age-related nuclear cataract, left eye: Secondary | ICD-10-CM | POA: Diagnosis not present

## 2019-04-23 DIAGNOSIS — N2581 Secondary hyperparathyroidism of renal origin: Secondary | ICD-10-CM | POA: Diagnosis not present

## 2019-04-23 DIAGNOSIS — D509 Iron deficiency anemia, unspecified: Secondary | ICD-10-CM | POA: Diagnosis not present

## 2019-04-23 DIAGNOSIS — N186 End stage renal disease: Secondary | ICD-10-CM | POA: Diagnosis not present

## 2019-04-23 DIAGNOSIS — D631 Anemia in chronic kidney disease: Secondary | ICD-10-CM | POA: Diagnosis not present

## 2019-04-23 DIAGNOSIS — Z992 Dependence on renal dialysis: Secondary | ICD-10-CM | POA: Diagnosis not present

## 2019-04-23 DIAGNOSIS — E876 Hypokalemia: Secondary | ICD-10-CM | POA: Diagnosis not present

## 2019-04-25 DIAGNOSIS — D509 Iron deficiency anemia, unspecified: Secondary | ICD-10-CM | POA: Diagnosis not present

## 2019-04-25 DIAGNOSIS — Z992 Dependence on renal dialysis: Secondary | ICD-10-CM | POA: Diagnosis not present

## 2019-04-25 DIAGNOSIS — N2581 Secondary hyperparathyroidism of renal origin: Secondary | ICD-10-CM | POA: Diagnosis not present

## 2019-04-25 DIAGNOSIS — E876 Hypokalemia: Secondary | ICD-10-CM | POA: Diagnosis not present

## 2019-04-25 DIAGNOSIS — N186 End stage renal disease: Secondary | ICD-10-CM | POA: Diagnosis not present

## 2019-04-25 DIAGNOSIS — D631 Anemia in chronic kidney disease: Secondary | ICD-10-CM | POA: Diagnosis not present

## 2019-04-26 ENCOUNTER — Ambulatory Visit
Admission: RE | Admit: 2019-04-26 | Discharge: 2019-04-26 | Disposition: A | Discharge status: Home / Self Care | Payer: Medicare Other | Source: Ambulatory Visit | Attending: Family Medicine | Admitting: Family Medicine

## 2019-04-26 ENCOUNTER — Other Ambulatory Visit: Payer: Self-pay

## 2019-04-26 DIAGNOSIS — M4802 Spinal stenosis, cervical region: Secondary | ICD-10-CM | POA: Diagnosis not present

## 2019-04-26 DIAGNOSIS — M542 Cervicalgia: Secondary | ICD-10-CM

## 2019-04-28 DIAGNOSIS — Z992 Dependence on renal dialysis: Secondary | ICD-10-CM | POA: Diagnosis not present

## 2019-04-28 DIAGNOSIS — E876 Hypokalemia: Secondary | ICD-10-CM | POA: Diagnosis not present

## 2019-04-28 DIAGNOSIS — N2581 Secondary hyperparathyroidism of renal origin: Secondary | ICD-10-CM | POA: Diagnosis not present

## 2019-04-28 DIAGNOSIS — D509 Iron deficiency anemia, unspecified: Secondary | ICD-10-CM | POA: Diagnosis not present

## 2019-04-28 DIAGNOSIS — D631 Anemia in chronic kidney disease: Secondary | ICD-10-CM | POA: Diagnosis not present

## 2019-04-28 DIAGNOSIS — N186 End stage renal disease: Secondary | ICD-10-CM | POA: Diagnosis not present

## 2019-04-30 DIAGNOSIS — N2581 Secondary hyperparathyroidism of renal origin: Secondary | ICD-10-CM | POA: Diagnosis not present

## 2019-04-30 DIAGNOSIS — E876 Hypokalemia: Secondary | ICD-10-CM | POA: Diagnosis not present

## 2019-04-30 DIAGNOSIS — N186 End stage renal disease: Secondary | ICD-10-CM | POA: Diagnosis not present

## 2019-04-30 DIAGNOSIS — Z992 Dependence on renal dialysis: Secondary | ICD-10-CM | POA: Diagnosis not present

## 2019-04-30 DIAGNOSIS — D509 Iron deficiency anemia, unspecified: Secondary | ICD-10-CM | POA: Diagnosis not present

## 2019-04-30 DIAGNOSIS — D631 Anemia in chronic kidney disease: Secondary | ICD-10-CM | POA: Diagnosis not present

## 2019-05-03 DIAGNOSIS — I129 Hypertensive chronic kidney disease with stage 1 through stage 4 chronic kidney disease, or unspecified chronic kidney disease: Secondary | ICD-10-CM | POA: Diagnosis not present

## 2019-05-03 DIAGNOSIS — D509 Iron deficiency anemia, unspecified: Secondary | ICD-10-CM | POA: Diagnosis not present

## 2019-05-03 DIAGNOSIS — I10 Essential (primary) hypertension: Secondary | ICD-10-CM | POA: Diagnosis not present

## 2019-05-03 DIAGNOSIS — M5010 Cervical disc disorder with radiculopathy, unspecified cervical region: Secondary | ICD-10-CM | POA: Diagnosis not present

## 2019-05-03 DIAGNOSIS — G8929 Other chronic pain: Secondary | ICD-10-CM | POA: Diagnosis not present

## 2019-05-03 DIAGNOSIS — Z992 Dependence on renal dialysis: Secondary | ICD-10-CM | POA: Diagnosis not present

## 2019-05-03 DIAGNOSIS — D631 Anemia in chronic kidney disease: Secondary | ICD-10-CM | POA: Diagnosis not present

## 2019-05-03 DIAGNOSIS — M542 Cervicalgia: Secondary | ICD-10-CM | POA: Diagnosis not present

## 2019-05-03 DIAGNOSIS — M9901 Segmental and somatic dysfunction of cervical region: Secondary | ICD-10-CM | POA: Diagnosis not present

## 2019-05-03 DIAGNOSIS — M5412 Radiculopathy, cervical region: Secondary | ICD-10-CM | POA: Diagnosis not present

## 2019-05-03 DIAGNOSIS — M25512 Pain in left shoulder: Secondary | ICD-10-CM | POA: Diagnosis not present

## 2019-05-03 DIAGNOSIS — Z1331 Encounter for screening for depression: Secondary | ICD-10-CM | POA: Diagnosis not present

## 2019-05-03 DIAGNOSIS — R0781 Pleurodynia: Secondary | ICD-10-CM | POA: Diagnosis not present

## 2019-05-03 DIAGNOSIS — N2581 Secondary hyperparathyroidism of renal origin: Secondary | ICD-10-CM | POA: Diagnosis not present

## 2019-05-03 DIAGNOSIS — N186 End stage renal disease: Secondary | ICD-10-CM | POA: Diagnosis not present

## 2019-05-03 DIAGNOSIS — E876 Hypokalemia: Secondary | ICD-10-CM | POA: Diagnosis not present

## 2019-05-03 DIAGNOSIS — Z23 Encounter for immunization: Secondary | ICD-10-CM | POA: Diagnosis not present

## 2019-05-03 DIAGNOSIS — M545 Low back pain: Secondary | ICD-10-CM | POA: Diagnosis not present

## 2019-05-05 DIAGNOSIS — D509 Iron deficiency anemia, unspecified: Secondary | ICD-10-CM | POA: Diagnosis not present

## 2019-05-05 DIAGNOSIS — Z992 Dependence on renal dialysis: Secondary | ICD-10-CM | POA: Diagnosis not present

## 2019-05-05 DIAGNOSIS — N186 End stage renal disease: Secondary | ICD-10-CM | POA: Diagnosis not present

## 2019-05-05 DIAGNOSIS — E876 Hypokalemia: Secondary | ICD-10-CM | POA: Diagnosis not present

## 2019-05-05 DIAGNOSIS — N2581 Secondary hyperparathyroidism of renal origin: Secondary | ICD-10-CM | POA: Diagnosis not present

## 2019-05-05 DIAGNOSIS — D631 Anemia in chronic kidney disease: Secondary | ICD-10-CM | POA: Diagnosis not present

## 2019-05-06 ENCOUNTER — Other Ambulatory Visit: Payer: Self-pay | Admitting: Family Medicine

## 2019-05-06 ENCOUNTER — Ambulatory Visit
Admission: RE | Admit: 2019-05-06 | Discharge: 2019-05-06 | Disposition: A | Discharge status: Home / Self Care | Payer: Medicare Other | Source: Ambulatory Visit | Attending: Family Medicine | Admitting: Family Medicine

## 2019-05-06 ENCOUNTER — Other Ambulatory Visit: Payer: Self-pay

## 2019-05-06 DIAGNOSIS — R0781 Pleurodynia: Secondary | ICD-10-CM

## 2019-05-06 DIAGNOSIS — R0602 Shortness of breath: Secondary | ICD-10-CM | POA: Diagnosis not present

## 2019-05-07 DIAGNOSIS — E876 Hypokalemia: Secondary | ICD-10-CM | POA: Diagnosis not present

## 2019-05-07 DIAGNOSIS — N2581 Secondary hyperparathyroidism of renal origin: Secondary | ICD-10-CM | POA: Diagnosis not present

## 2019-05-07 DIAGNOSIS — D631 Anemia in chronic kidney disease: Secondary | ICD-10-CM | POA: Diagnosis not present

## 2019-05-07 DIAGNOSIS — Z992 Dependence on renal dialysis: Secondary | ICD-10-CM | POA: Diagnosis not present

## 2019-05-07 DIAGNOSIS — N186 End stage renal disease: Secondary | ICD-10-CM | POA: Diagnosis not present

## 2019-05-07 DIAGNOSIS — D509 Iron deficiency anemia, unspecified: Secondary | ICD-10-CM | POA: Diagnosis not present

## 2019-05-10 DIAGNOSIS — Z992 Dependence on renal dialysis: Secondary | ICD-10-CM | POA: Diagnosis not present

## 2019-05-10 DIAGNOSIS — D631 Anemia in chronic kidney disease: Secondary | ICD-10-CM | POA: Diagnosis not present

## 2019-05-10 DIAGNOSIS — N186 End stage renal disease: Secondary | ICD-10-CM | POA: Diagnosis not present

## 2019-05-10 DIAGNOSIS — N2581 Secondary hyperparathyroidism of renal origin: Secondary | ICD-10-CM | POA: Diagnosis not present

## 2019-05-10 DIAGNOSIS — D509 Iron deficiency anemia, unspecified: Secondary | ICD-10-CM | POA: Diagnosis not present

## 2019-05-10 DIAGNOSIS — E876 Hypokalemia: Secondary | ICD-10-CM | POA: Diagnosis not present

## 2019-05-12 DIAGNOSIS — H2513 Age-related nuclear cataract, bilateral: Secondary | ICD-10-CM | POA: Diagnosis not present

## 2019-05-12 DIAGNOSIS — N2581 Secondary hyperparathyroidism of renal origin: Secondary | ICD-10-CM | POA: Diagnosis not present

## 2019-05-12 DIAGNOSIS — H2511 Age-related nuclear cataract, right eye: Secondary | ICD-10-CM | POA: Diagnosis not present

## 2019-05-12 DIAGNOSIS — D509 Iron deficiency anemia, unspecified: Secondary | ICD-10-CM | POA: Diagnosis not present

## 2019-05-12 DIAGNOSIS — E876 Hypokalemia: Secondary | ICD-10-CM | POA: Diagnosis not present

## 2019-05-12 DIAGNOSIS — H25013 Cortical age-related cataract, bilateral: Secondary | ICD-10-CM | POA: Diagnosis not present

## 2019-05-12 DIAGNOSIS — H18413 Arcus senilis, bilateral: Secondary | ICD-10-CM | POA: Diagnosis not present

## 2019-05-12 DIAGNOSIS — H25043 Posterior subcapsular polar age-related cataract, bilateral: Secondary | ICD-10-CM | POA: Diagnosis not present

## 2019-05-12 DIAGNOSIS — Z992 Dependence on renal dialysis: Secondary | ICD-10-CM | POA: Diagnosis not present

## 2019-05-12 DIAGNOSIS — N186 End stage renal disease: Secondary | ICD-10-CM | POA: Diagnosis not present

## 2019-05-12 DIAGNOSIS — D631 Anemia in chronic kidney disease: Secondary | ICD-10-CM | POA: Diagnosis not present

## 2019-05-14 DIAGNOSIS — Z992 Dependence on renal dialysis: Secondary | ICD-10-CM | POA: Diagnosis not present

## 2019-05-14 DIAGNOSIS — E876 Hypokalemia: Secondary | ICD-10-CM | POA: Diagnosis not present

## 2019-05-14 DIAGNOSIS — D631 Anemia in chronic kidney disease: Secondary | ICD-10-CM | POA: Diagnosis not present

## 2019-05-14 DIAGNOSIS — N186 End stage renal disease: Secondary | ICD-10-CM | POA: Diagnosis not present

## 2019-05-14 DIAGNOSIS — N2581 Secondary hyperparathyroidism of renal origin: Secondary | ICD-10-CM | POA: Diagnosis not present

## 2019-05-14 DIAGNOSIS — D509 Iron deficiency anemia, unspecified: Secondary | ICD-10-CM | POA: Diagnosis not present

## 2019-05-16 ENCOUNTER — Inpatient Hospital Stay (HOSPITAL_COMMUNITY)
Admission: EM | Admit: 2019-05-16 | Discharge: 2019-05-18 | DRG: 291 | Discharge status: Against Medical Advice | Payer: Medicare Other | Attending: Family Medicine | Admitting: Family Medicine

## 2019-05-16 ENCOUNTER — Encounter (HOSPITAL_COMMUNITY): Payer: Self-pay | Admitting: Emergency Medicine

## 2019-05-16 ENCOUNTER — Telehealth: Payer: Self-pay | Admitting: Cardiology

## 2019-05-16 ENCOUNTER — Other Ambulatory Visit: Payer: Self-pay

## 2019-05-16 ENCOUNTER — Emergency Department (HOSPITAL_COMMUNITY): Payer: Medicare Other

## 2019-05-16 DIAGNOSIS — E872 Acidosis: Secondary | ICD-10-CM | POA: Diagnosis not present

## 2019-05-16 DIAGNOSIS — N186 End stage renal disease: Secondary | ICD-10-CM | POA: Diagnosis present

## 2019-05-16 DIAGNOSIS — Z7951 Long term (current) use of inhaled steroids: Secondary | ICD-10-CM

## 2019-05-16 DIAGNOSIS — I132 Hypertensive heart and chronic kidney disease with heart failure and with stage 5 chronic kidney disease, or end stage renal disease: Secondary | ICD-10-CM | POA: Diagnosis not present

## 2019-05-16 DIAGNOSIS — I5033 Acute on chronic diastolic (congestive) heart failure: Secondary | ICD-10-CM | POA: Diagnosis not present

## 2019-05-16 DIAGNOSIS — E785 Hyperlipidemia, unspecified: Secondary | ICD-10-CM | POA: Diagnosis present

## 2019-05-16 DIAGNOSIS — Z7952 Long term (current) use of systemic steroids: Secondary | ICD-10-CM

## 2019-05-16 DIAGNOSIS — Z9114 Patient's other noncompliance with medication regimen: Secondary | ICD-10-CM

## 2019-05-16 DIAGNOSIS — D72819 Decreased white blood cell count, unspecified: Secondary | ICD-10-CM | POA: Diagnosis present

## 2019-05-16 DIAGNOSIS — J45909 Unspecified asthma, uncomplicated: Secondary | ICD-10-CM | POA: Diagnosis present

## 2019-05-16 DIAGNOSIS — Z20828 Contact with and (suspected) exposure to other viral communicable diseases: Secondary | ICD-10-CM | POA: Diagnosis present

## 2019-05-16 DIAGNOSIS — Z79899 Other long term (current) drug therapy: Secondary | ICD-10-CM

## 2019-05-16 DIAGNOSIS — I161 Hypertensive emergency: Secondary | ICD-10-CM | POA: Diagnosis present

## 2019-05-16 DIAGNOSIS — R0602 Shortness of breath: Secondary | ICD-10-CM

## 2019-05-16 DIAGNOSIS — J810 Acute pulmonary edema: Secondary | ICD-10-CM

## 2019-05-16 DIAGNOSIS — Z8673 Personal history of transient ischemic attack (TIA), and cerebral infarction without residual deficits: Secondary | ICD-10-CM

## 2019-05-16 DIAGNOSIS — Z992 Dependence on renal dialysis: Secondary | ICD-10-CM

## 2019-05-16 DIAGNOSIS — G2581 Restless legs syndrome: Secondary | ICD-10-CM | POA: Diagnosis present

## 2019-05-16 DIAGNOSIS — I509 Heart failure, unspecified: Secondary | ICD-10-CM

## 2019-05-16 DIAGNOSIS — Z825 Family history of asthma and other chronic lower respiratory diseases: Secondary | ICD-10-CM

## 2019-05-16 DIAGNOSIS — G8929 Other chronic pain: Secondary | ICD-10-CM | POA: Diagnosis present

## 2019-05-16 DIAGNOSIS — Z7982 Long term (current) use of aspirin: Secondary | ICD-10-CM

## 2019-05-16 DIAGNOSIS — D631 Anemia in chronic kidney disease: Secondary | ICD-10-CM | POA: Diagnosis present

## 2019-05-16 DIAGNOSIS — R079 Chest pain, unspecified: Secondary | ICD-10-CM | POA: Diagnosis not present

## 2019-05-16 HISTORY — DX: Heart failure, unspecified: I50.9

## 2019-05-16 LAB — CBC
HCT: 30.7 % — ABNORMAL LOW (ref 39.0–52.0)
Hemoglobin: 10 g/dL — ABNORMAL LOW (ref 13.0–17.0)
MCH: 36.4 pg — ABNORMAL HIGH (ref 26.0–34.0)
MCHC: 32.6 g/dL (ref 30.0–36.0)
MCV: 111.6 fL — ABNORMAL HIGH (ref 80.0–100.0)
Platelets: 149 10*3/uL — ABNORMAL LOW (ref 150–400)
RBC: 2.75 MIL/uL — ABNORMAL LOW (ref 4.22–5.81)
RDW: 15.1 % (ref 11.5–15.5)
WBC: 3.2 10*3/uL — ABNORMAL LOW (ref 4.0–10.5)
nRBC: 0 % (ref 0.0–0.2)

## 2019-05-16 LAB — BASIC METABOLIC PANEL
Anion gap: 16 — ABNORMAL HIGH (ref 5–15)
BUN: 57 mg/dL — ABNORMAL HIGH (ref 6–20)
CO2: 26 mmol/L (ref 22–32)
Calcium: 10.1 mg/dL (ref 8.9–10.3)
Chloride: 97 mmol/L — ABNORMAL LOW (ref 98–111)
Creatinine, Ser: 8.3 mg/dL — ABNORMAL HIGH (ref 0.61–1.24)
GFR calc Af Amer: 7 mL/min — ABNORMAL LOW (ref >60)
GFR calc non Af Amer: 6 mL/min — ABNORMAL LOW (ref >60)
Glucose, Bld: 91 mg/dL (ref 70–99)
Potassium: 4.9 mmol/L (ref 3.5–5.1)
Sodium: 139 mmol/L (ref 135–145)

## 2019-05-16 LAB — BRAIN NATRIURETIC PEPTIDE: B Natriuretic Peptide: >4500 pg/mL — ABNORMAL HIGH (ref 0.0–100.0)

## 2019-05-16 LAB — TROPONIN I (HIGH SENSITIVITY)
Troponin I (High Sensitivity): 42 ng/L — ABNORMAL HIGH (ref <18)
Troponin I (High Sensitivity): 38 ng/L — ABNORMAL HIGH (ref <18)

## 2019-05-16 LAB — SARS CORONAVIRUS 2 (TAT 6-24 HRS): SARS Coronavirus 2: NEGATIVE

## 2019-05-16 MED ORDER — RENA-VITE PO TABS
1.0000 | ORAL_TABLET | Freq: Every day | ORAL | Status: DC
  Administered 2019-05-17 – 2019-05-18 (×2): 1 via ORAL
  Filled 2019-05-16 (×2): qty 1

## 2019-05-16 MED ORDER — TRAMADOL-ACETAMINOPHEN 37.5-325 MG PO TABS
1.0000 | ORAL_TABLET | Freq: Three times a day (TID) | ORAL | Status: DC | PRN
  Filled 2019-05-16: qty 1

## 2019-05-16 MED ORDER — SODIUM CHLORIDE 0.9 % IV SOLN: 100.0000 mL | INTRAVENOUS | Status: DC | PRN

## 2019-05-16 MED ORDER — METOCLOPRAMIDE HCL 10 MG PO TABS
5.0000 mg | ORAL_TABLET | Freq: Three times a day (TID) | ORAL | Status: DC | PRN
  Administered 2019-05-17: 5 mg via ORAL
  Filled 2019-05-16: qty 1

## 2019-05-16 MED ORDER — CARVEDILOL 12.5 MG PO TABS
12.5000 mg | ORAL_TABLET | Freq: Two times a day (BID) | ORAL | Status: DC
  Administered 2019-05-16 – 2019-05-17 (×2): 12.5 mg via ORAL
  Filled 2019-05-16 (×2): qty 1

## 2019-05-16 MED ORDER — HEPARIN SODIUM (PORCINE) 5000 UNIT/ML IJ SOLN
5000.0000 [IU] | Freq: Three times a day (TID) | INTRAMUSCULAR | Status: DC
  Administered 2019-05-17 – 2019-05-18 (×4): 5000 [IU] via SUBCUTANEOUS
  Filled 2019-05-16 (×4): qty 1

## 2019-05-16 MED ORDER — ACETAMINOPHEN 325 MG PO TABS
650.0000 mg | ORAL_TABLET | Freq: Four times a day (QID) | ORAL | Status: DC
  Administered 2019-05-16 – 2019-05-18 (×6): 650 mg via ORAL
  Filled 2019-05-16 (×6): qty 2

## 2019-05-16 MED ORDER — LIDOCAINE-PRILOCAINE 2.5-2.5 % EX CREA
1.0000 "application " | TOPICAL_CREAM | CUTANEOUS | Status: DC | PRN
  Filled 2019-05-16: qty 5

## 2019-05-16 MED ORDER — PANTOPRAZOLE SODIUM 40 MG PO TBEC
40.0000 mg | DELAYED_RELEASE_TABLET | Freq: Every day | ORAL | Status: DC
  Administered 2019-05-16 – 2019-05-17 (×2): 40 mg via ORAL
  Filled 2019-05-16 (×2): qty 1

## 2019-05-16 MED ORDER — TRAMADOL HCL 50 MG PO TABS
50.0000 mg | ORAL_TABLET | Freq: Four times a day (QID) | ORAL | Status: DC | PRN
  Administered 2019-05-16 – 2019-05-17 (×2): 50 mg via ORAL
  Filled 2019-05-16 (×2): qty 1

## 2019-05-16 MED ORDER — ROPINIROLE HCL 1 MG PO TABS
0.5000 mg | ORAL_TABLET | Freq: Two times a day (BID) | ORAL | Status: DC
  Administered 2019-05-17 – 2019-05-18 (×3): 0.5 mg via ORAL
  Filled 2019-05-16 (×4): qty 1

## 2019-05-16 MED ORDER — HYDRALAZINE HCL 50 MG PO TABS
100.0000 mg | ORAL_TABLET | Freq: Three times a day (TID) | ORAL | Status: DC
  Administered 2019-05-16 – 2019-05-18 (×5): 100 mg via ORAL
  Filled 2019-05-16 (×4): qty 4
  Filled 2019-05-16: qty 2
  Filled 2019-05-16: qty 4

## 2019-05-16 MED ORDER — ALBUTEROL SULFATE HFA 108 (90 BASE) MCG/ACT IN AERS
1.0000 | INHALATION_SPRAY | Freq: Four times a day (QID) | RESPIRATORY_TRACT | Status: DC | PRN
  Filled 2019-05-16: qty 6.7

## 2019-05-16 MED ORDER — AMLODIPINE BESYLATE 10 MG PO TABS
10.0000 mg | ORAL_TABLET | Freq: Every day | ORAL | Status: DC
  Administered 2019-05-16 – 2019-05-17 (×2): 10 mg via ORAL
  Filled 2019-05-16: qty 2
  Filled 2019-05-16: qty 1

## 2019-05-16 MED ORDER — MOMETASONE FURO-FORMOTEROL FUM 100-5 MCG/ACT IN AERO
2.0000 | INHALATION_SPRAY | Freq: Two times a day (BID) | RESPIRATORY_TRACT | Status: DC
  Administered 2019-05-16 – 2019-05-17 (×3): 2 via RESPIRATORY_TRACT
  Filled 2019-05-16: qty 8.8

## 2019-05-16 MED ORDER — FUROSEMIDE 10 MG/ML IJ SOLN
40.0000 mg | Freq: Once | INTRAMUSCULAR | Status: AC
  Administered 2019-05-16: 18:00:00 40 mg via INTRAVENOUS
  Filled 2019-05-16: qty 4

## 2019-05-16 MED ORDER — PENTAFLUOROPROP-TETRAFLUOROETH EX AERO
1.0000 "application " | INHALATION_SPRAY | CUTANEOUS | Status: DC | PRN
  Filled 2019-05-16: qty 30

## 2019-05-16 MED ORDER — LIDOCAINE 5 % EX PTCH
1.0000 | MEDICATED_PATCH | CUTANEOUS | Status: DC
  Administered 2019-05-17 – 2019-05-18 (×2): 1 via TRANSDERMAL
  Filled 2019-05-16 (×3): qty 1

## 2019-05-16 MED ORDER — SODIUM CHLORIDE 0.9% FLUSH: 3.0000 mL | Freq: Once | INTRAVENOUS | Status: DC

## 2019-05-16 MED ORDER — ASPIRIN EC 81 MG PO TBEC
81.0000 mg | DELAYED_RELEASE_TABLET | Freq: Every day | ORAL | Status: DC
  Administered 2019-05-16 – 2019-05-17 (×2): 81 mg via ORAL
  Filled 2019-05-16 (×2): qty 1

## 2019-05-16 MED ORDER — CHLORHEXIDINE GLUCONATE CLOTH 2 % EX PADS
6.0000 | MEDICATED_PAD | Freq: Every day | CUTANEOUS | Status: DC
  Administered 2019-05-17: 02:00:00 6 via TOPICAL

## 2019-05-16 MED ORDER — CINACALCET HCL 30 MG PO TABS
30.0000 mg | ORAL_TABLET | Freq: Every day | ORAL | Status: DC
  Administered 2019-05-16 – 2019-05-17 (×2): 30 mg via ORAL
  Filled 2019-05-16 (×2): qty 1

## 2019-05-16 MED ORDER — SEVELAMER CARBONATE 800 MG PO TABS
1600.0000 mg | ORAL_TABLET | Freq: Three times a day (TID) | ORAL | Status: DC
  Administered 2019-05-16 – 2019-05-17 (×3): 1600 mg via ORAL
  Filled 2019-05-16 (×6): qty 2

## 2019-05-16 MED ORDER — GABAPENTIN 300 MG PO CAPS
300.0000 mg | ORAL_CAPSULE | Freq: Every day | ORAL | Status: DC
  Administered 2019-05-17 – 2019-05-18 (×2): 300 mg via ORAL
  Filled 2019-05-16 (×2): qty 1

## 2019-05-16 MED ORDER — LIDOCAINE HCL (PF) 1 % IJ SOLN
5.0000 mL | INTRAMUSCULAR | Status: DC | PRN
  Filled 2019-05-16: qty 5

## 2019-05-16 NOTE — ED Triage Notes (Signed)
Pt here from home with c/o chest pressure non radiating , with chronic sob , pt states that the pain was worse on Saturday , pt does dialysis on T, TH , sat

## 2019-05-16 NOTE — Progress Notes (Signed)
FPTS Brief Note  Patient seen at 1630 today for complaints of dyspnea and chest heaviness when lying flat.   HPI Briefly, patient is a 58 year old with history of ESRD due to HTN, alcohol use (resolved), CVA, HTN, and anemia of chronic disease presenting with progressive orthopnea and PND. He provides a relatively vague history. He reports several months of symptoms, recently worsened. Reports heaviness in his chest when lying flat, dyspnea on exertion, orthopnea, PND, and edema in his bilateral LE. No chest pain, no fevers, sputum production, recent sick contacts. Denies dietary changes, increased sodium. Reports he misses most medications most days as he only takes his medications if he eats. Attends [facility]. Dry weight is 63 kg. Reports he often leaves HD early due to leg pain.   Additionally he endorses chronic L shoulder pain.    PMH: ESRD, HTN, CVA< anemia, GI bleed (2018), asthma, chronic left shoulder pain  PSH: Left forearm fistula   Family history: Mother had COPD Social: Lives with roommate- Edmonia Lynch, who helps him and his dog, Callie. Never smoker. No illicit substances. Previously drank excess alcohol, reports he had two bud lights earlier this month.   Reviewed ED labs. Troponin flat, BNP elevated. BMP shows AGMA, leukopenia present, anemia improved from baseline.   Patient seen in full PPE as COVID pending.  HEENT: Sclera anicteric. Thin facial features. Dentition is poor. Appears well hydrated. Neck: Supple, JVP 8 cm water  Cardiac: Regular rate and rhythm. Normal S1/S2. Lungs: + bilateral rales  Abdomen: Normoactive bowel sounds. No tenderness to deep or light palpation. No rebound or guarding.  Extremities: Warm, well perfused with 2+ edema at BL ankles .  Skin: Warm, dry Psych: Pleasant and appropriate, tangential speech   A/P 58 year old with history of ESRD due to HTN presenting with hypertensive emergency and symptomatic pulmonary edema, likely due to  hypervolemia in setting of inadequate HD (leaves early) and non-adherence to medication. Differential includes progression of his HFpEF. Given his medical history, concern for presentation of angina, although less likely as no change in chest pain from prior and troponin flat. His intermittent pain and dyspnea are likely due to volume overload. - Admit to Family Medicine, appreciate Nephrology care and consultation. Dialysis today.  - Restart home BP medications.  - Echocardiogram to evaluate LV function   Leukopenia- differential add on, monitor, likely due to chronic disease and suspected liver disease given EtOH use (heavy in past).   Will attest resident physician note as available.   Dorris Singh, MD  Family Medicine Teaching Service

## 2019-05-16 NOTE — ED Notes (Addendum)
Pt ambulated on RA SPO2 dropped to 82%. Pt placed on 2L O2 via [place] with SPO2 returned to 91% while ambulating. At rest pt SPO2 93% on 2L O2 via . Pt HR increased to 120 while ambulating. Pt HR eturn to 75 while at rest.

## 2019-05-16 NOTE — Telephone Encounter (Signed)
Pt called to report that he has been having chest discomfort for 2 weeks but much worse this past weekend.. he says it had occurred this past Saturday when he was resting and his left shoulder and arm were numb.. he was very nauseous and he made himself vomit with some short term relief.. he does not feel well today he had more chest discomfort last night that kept him up.. he is not dizzy but feels very weak.   He was coughing aggressively on the phone and sounded SOB.. he says he has only been to dialysis and no where that he could have caught COVID... they check him there for a fever but he has not had one.   He is saying that his cough has been there for a few weeks... sometimes productive and it makes his chest hurt.   I advised him to go the ED but not to drive himself.. he will wear his mask and alert them upon arrival of his cough and symptoms.   Will forward to Dr. Marlou Porch for review.

## 2019-05-16 NOTE — Telephone Encounter (Signed)
New message   Pt c/o of Chest Pain: STAT if CP now or developed within 24 hours  1. Are you having CP right now?no   2. Are you experiencing any other symptoms (ex. SOB, nausea, vomiting, sweating)?chest pain, nauseated, sob,numbness   3. How long have you been experiencing CP? Patient states that he has had chest pressure for 2 months   4. Is your CP continuous or coming and going? Continuous   5. Have you taken Nitroglycerin? No  ?

## 2019-05-16 NOTE — H&P (Addendum)
[facility] Admission History and Physical Service Pager: [phone]  Patient name: Thomas Robinson Medical record number: [id number] Date of birth: [birth year] Age: 58 y.o. Gender: male  Primary Care Provider: Charlott Rakes, MD Consultants: Nephrology,  Code Status: FULL   Preferred Emergency Contact:  Chyrl Civatte, patient's friend, at [phone]  Chief Complaint: chest pain, SOB and fatigue   Assessment and Plan: Thomas Robinson is a 58 y.o. male presenting with chest pain and shortness of breath. PMH is significant for ESRD, HFpEF, CVA, macrocytic anemia, hypertension, likely remote alcohol abuse.    Acute on chronic HFpEF  Patient with several months of chest pressure and shortness of breath.  Increasing shortness of breath over the weekend. Patient called his cardiologist who recommended he be seen in the ED.  Patient reports having dyspnea on exertion, orthopnea, and paroxysmal nocturnal dyspnea.  He reports at times he has to take a break in the shower and lean against the wall, and he sleeps in a recliner.  He has used his albuterol nebulizer at home without relief.  He attends dialysis T/Th/Sat and states he has not missed any sessions however sometimes he has to leave early because he has leg cramps.  Patient with no oxygen requirement of 2 L. BNP greater than 4500.  Patient reports dry weight is around 62-63 kg and weight today 68 kg. Denies recent excess salt intake.  Most recent echo (02/17/18) with EF 55 to 60% and moderate left ventricular hypertrophy with grade 2 diastolic dysfunction.  Will obtain new echo. ACS possible with troponin elevated to 42 and 38 however troponins have remained flat, and elevation is likely due to ESRD.  PE not likely cause of patient's shortness of breath as he is not tachycardic and there are no acute findings of PE on EKG.  Patient had cough with clear sputum chest x-ray did not show pneumonia.  COPD unlikely given  lack of smoking history and no hyperinflation on CXR.  Patient is volume overloaded on exam and it is likely that the cause of patient's shortness of breath is due to acute on chronic heart failure with possible overestimation of dry weight at dialysis.  Chest x-ray indicated interstitial pulmonary edema.  Nephrology was consulted in the ED and have recommended ultrafiltration today with resumption of his regular scheduled dialysis tomorrow. -Admit to [facility], attending Dr. Owens Shark -Repeat ECHO -Lasix 40 mg IV once -Vitals per routine -Daily renal function panel -Strict I's and O's -Daily weights -Wean oxygen as tolerated -Nephrology consulted, appreciate recommendations   Hypertension  Hypertensive urgency, likely due to volume overload and missed medications.  Patient did not take medications today as he did not eat. Patient's blood pressure significantly elevated on admission.  Blood pressures since admission have ranged between 216/89-178/85.  Home meds include Coreg 12.5 mg twice daily, hydralazine 100 mg 3 times a day and amlodipine 10 mg daily.  Patient to have dialysis to relieve fluid burden.  Nephrology following. - Continue home meds - Monitor blood pressures -Per nephrology, consider ACE inhibitor if patient remains hypertensive after dialysis   ESRD  T/Th/Sat  Last dialysis Sat.  On dialysis due to hypertensive renal disease.  Takes renal vitamin, Renvela 1600mg  threes times daily, and Cinacalcet at 30 mg.  Nephrology following.   -Continue home medications -Nephrology following, appreciate recommendations   Chronic upper extremity pain Patient with chronic left upper extremity numbness and tingling after a fall and CVA.  Home meds include gabapentin 300  mg at bedtime -Continue home medications  Restless leg syndrome Home regimen includes ropinirole 0.5 mg twice daily -Continue home medications  Asthma  Patient reports worsening of shortness of breath with cough and white  sputum.  COVID negative and chest x-ray negative for pneumonia.  Describes no relief at home with albuterol nebulizers.  Does not use oxygen at baseline.  Patient using 2 L is nasal cannula.  Home regimen includes albuterol nebulizers, symbicort. -Continue home medications (Dulera per hospital formulary) -Wean oxygen as tolerated  Anemia Patient with history of GI bleed in 2018 required blood transfusion, history of anemia of chronic disease (ESRD), macrocytic anemia also.  Patient with shortness of breath today. Hgb 10.0, MCV 111.6.  With significant elevation in MCV we will obtain Folate and B12 labs.  These labs were obtained in 2019 and we normal, but MCV was 97 at that time.  Macrocytic anemia may be due to history of alcohol use.  Will also monitor leukopenia. -Folate and B12 -CBC  Hx of CVA  Patient had a stroke in August 2016 with MRI that showed small acute lacunar infarct of the right.  Residual left upper extremity anesthesias and occasional numbness.  -81 mg ASA    FEN/GI: renal diet, dialysis  Prophylaxis: Heparin    Disposition: likely home pending medical stabilization   History of Present Illness:  Thomas Robinson is a 58 y.o. male presenting with shortness of breath and chest pain.  His symptoms began on Saturday, 9/12 with chest pain, shortness of breath, and nausea.  He describes his chest pressure as central and radiates to his back and left shoulder.  Ports nausea and one episode of vomiting. He felt pressure when he laid down last night which forced him to sit up or lie propped up in order to breathe.  He called his cardiologist's office this morning and was told to go to the ED.  He was told that Dr. Marlou Porch would see him in the ED.  He has had shortness of breath and chest pressure for several months and it worsens acutely over the weekend.  He has also had orthopnea for months and describes paroxysmal nocturnal dyspnea.  He has a history of an aortic aneurysm and has  received care from Dr. Marlou Porch for this.  He says he has had two strokes little more than two years ago and has subsequent weakness and parasthesias of his left arm.   He says he has had a cough productive of clear sputum that is partially relieved with his nebulized albuterol.    Review Of Systems: Per HPI with the following additions:   Review of Systems  Constitutional: Negative for fever.  HENT: Negative for congestion and sore throat.   Respiratory: Positive for cough, sputum production and shortness of breath.   Cardiovascular: Positive for chest pain, orthopnea, leg swelling and PND.  Gastrointestinal: Positive for abdominal pain, nausea and vomiting. Negative for blood in stool, constipation and diarrhea.  Genitourinary: Negative for dysuria, frequency and urgency.  Musculoskeletal: Positive for joint pain (L shoulder ). Negative for falls.  Neurological: Positive for weakness and headaches. Negative for dizziness and loss of consciousness.  Psychiatric/Behavioral: The patient has insomnia.     Patient Active Problem List   Diagnosis Date Noted  . Chest pain 04/13/2018  . Acute on chronic diastolic CHF (congestive heart failure) ([place]) 04/13/2018  . Chest pressure   . Atypical chest pain 02/16/2018  . ESRD on dialysis ([place]) 02/16/2018  . Volume  overload 10/23/2017  . Cough 08/23/2017  . Constipation   . Macrocytic anemia 05/24/2017  . Hypertensive urgency 05/24/2017  . History of completed stroke   . Essential hypertension 04/09/2015    Past Medical History: Past Medical History:  Diagnosis Date  . Acute kidney injury superimposed on CKD ([place]) 05/24/2017  . Acute on chronic diastolic CHF (congestive heart failure) ([place]) 04/13/2018  . AKI (acute kidney injury) ([place]) 04/10/2015  . Asthma   . Asthma, chronic, unspecified asthma severity, with acute exacerbation 10/23/2017  . Atypical chest pain 02/16/2018  . CAP (community acquired pneumonia) 10/23/2017  . Cerebral infarction  due to thrombosis of cerebral artery ([place])   . Cerebrovascular accident (CVA) ([place])   . Chronic kidney disease   . CKD (chronic kidney disease), stage V ([place]) 05/24/2017  . ESRD (end stage renal disease) on dialysis ([place]) 02/16/2018  . Essential hypertension 04/09/2015  . GI bleeding 05/24/2017  . History of completed stroke   . Hyperlipidemia   . Hypertension   . Hypertensive urgency 05/24/2017  . Kidney failure   . Respiratory failure, acute ([place]) 10/23/2017  . Restless leg syndrome   . Stroke ([place])   . Symptomatic anemia 05/24/2017    Past Surgical History: Past Surgical History:  Procedure Laterality Date  . AV FISTULA PLACEMENT Left 05/28/2017   Procedure: LEFT ARM ARTERIOVENOUS (AV) FISTULA CREATION;  Surgeon: Conrad Trujillo Alto, MD;  Location: [facility];  Service: Vascular;  Laterality: Left;  . IR FLUORO GUIDE CV LINE RIGHT  05/25/2017  . IR US GUIDE VASC ACCESS RIGHT  05/25/2017  . RINOPLASTY      Social History: Social History   Tobacco Use  . Smoking status: Never Smoker  . Smokeless tobacco: Never Used  Substance Use Topics  . Alcohol use: No    Alcohol/week: 0.0 standard drinks    Comment: has a drink once in a while- 07/2018  . Drug use: No   Additional social history: lives with male friend and dog Cally and reports had 2 beers a few weeks ago.  Denies tobacco use and illicit drug use. Please also refer to relevant sections of EMR.  Family History: Family History  Problem Relation Age of Onset  . Cancer Mother     Allergies and Medications: No Known Allergies No current facility-administered medications on file prior to encounter.    Current Outpatient Medications on File Prior to Encounter  Medication Sig Dispense Refill  . albuterol (PROVENTIL HFA;VENTOLIN HFA) 108 (90 BASE) MCG/ACT inhaler Inhale 1-2 puffs into the lungs every 6 (six) hours as needed for wheezing or shortness of breath. 1 Inhaler 0  . amLODipine (NORVASC) 10 MG tablet Take 1 tablet (10 mg  total) by mouth daily. 30 tablet 0  . aspirin EC 81 MG EC tablet Take 1 tablet (81 mg total) by mouth daily. 30 tablet 0  . budesonide-formoterol (SYMBICORT) 80-4.5 MCG/ACT inhaler Inhale 2 puffs into the lungs 2 (two) times daily. 1 Inhaler 12  . carvedilol (COREG) 12.5 MG tablet Take 1 tablet (12.5 mg total) by mouth 2 (two) times daily with a meal. 60 tablet 0  . cinacalcet (SENSIPAR) 30 MG tablet Take 30 mg by mouth daily with supper. Do not take less than 12 hours prior to dialysis  10  . clonazePAM (KLONOPIN) 0.5 MG tablet Take 0.5 mg by mouth at bedtime.     . gabapentin (NEURONTIN) 100 MG capsule Take 100 mg by mouth at bedtime.     [name]  hydrALAZINE (APRESOLINE) 100 MG tablet Take 1 tablet (100 mg total) by mouth every 8 (eight) hours. 90 tablet 0  . metoCLOPramide (REGLAN) 5 MG tablet Take 5 mg by mouth every 8 (eight) hours as needed for nausea.   0  . montelukast (SINGULAIR) 10 MG tablet Take 1 tablet (10 mg total) by mouth at bedtime. 30 tablet 0  . multivitamin (RENA-VIT) TABS tablet Take 1 tablet by mouth daily.  3  . pantoprazole (PROTONIX) 40 MG tablet Take 40 mg by mouth daily.  3  . polyethylene glycol (MIRALAX / GLYCOLAX) packet Take 17 g by mouth daily as needed. (Patient taking differently: Take 17 g by mouth daily as needed for mild constipation. ) 14 each 0  . predniSONE (DELTASONE) 10 MG tablet Prednisone 60 mg daily for 3 days, then 40 mg po daily for 3 days, then 30 mg po daily for 3 days, then 20 mg po daily for 3 days and then 10 mg po daily for 3 days. 48 tablet 0  . rOPINIRole (REQUIP) 0.5 MG tablet Take 0.5 mg by mouth 2 (two) times daily.  0  . sevelamer (RENAGEL) 800 MG tablet Take 800-1,600 mg by mouth See admin instructions. 1,600mg  by mouth three times daily and 800mg  twice daily with snacks  6    Objective: BP (!) 201/89   Pulse 65   Temp 98.1 F (36.7 C) (Oral)   Resp 17   Ht 5' 7.5" (1.715 m)   Wt 68 kg   SpO2 97%   BMI 23.15 kg/m    Exam:  GEN:      Alert, male sitting upright, appears comfortable, in no acute distress HEENT:  mucus membranes moist, oropharyngeal without lesions or erythema,  nares patent, no nasal discharge EYES:   pupils equal and reactive, EOM intact NECK:  supple, normal ROM, no lymphadenopathy, JVD to jaw-line RESP:  bilateral rales, no wheezing, no increased work of breathing, nasal canula CVS:   regular rate and rhythm, no murmur, distal pulses intact, left forearm fistula with good thrill  ABD:  soft, non-tender; non-distended, bowel sounds present; no palpable masses EXT:   normal ROM, atraumatic, 1+ bilateral pitting edema to the calf, left shoulder with normal ROM, NEURO:  normal without focal findings,  speech normal, alert and oriented   Skin:   warm and dry, no rash, normal skin turgor Psych: Normal affect and thought content     Labs and Imaging: CBC BMET  Recent Labs  Lab 05/16/19 1211  WBC 3.2*  HGB 10.0*  HCT 30.7*  PLT 149*   Recent Labs  Lab 05/16/19 1211  NA 139  K 4.9  CL 97*  CO2 26  BUN 57*  CREATININE 8.30*  GLUCOSE 91  CALCIUM 10.1     EKG: Sinus rhythm with 1st degree A-V block HR    Lyndee Hensen, MD 05/16/2019, 3:39 PM PGY-1, [place] Intern pager: [phone], text pages welcome  FPTS Upper-Level Resident Addendum   I have independently interviewed and examined the patient. I have discussed the above with the original author and agree with their documentation. My edits for correction/addition/clarification are in blue. Please see also any attending notes.    Kathrene Alu, MD PGY-3, [facility] 05/16/2019 9:03 PM  [facility] Service pager: [phone] (text pages welcome through [facility])

## 2019-05-16 NOTE — Telephone Encounter (Signed)
Agree with plan to get evaluated in the emergency department.  Thankfully, coronary CT scan was done in 2019 which showed only mild CAD.    Candee Furbish, MD

## 2019-05-16 NOTE — ED Notes (Signed)
Paged admitting MD regarding chronic pain

## 2019-05-16 NOTE — ED Provider Notes (Signed)
[facility] EMERGENCY DEPARTMENT Provider Note   CSN: [id number] Arrival date & time: 05/16/19  1153     History   Chief Complaint Chief Complaint  Patient presents with   Chest Pain    HPI Thomas Robinson is a 58 y.o. male.     58 y.o male with a PMH of hyperlipidemia, CKD, hypertensive urgency, ESRD, CAP, CVA presents to the ED with a chief complaint of chest pain along with shortness of breath x 2 days.  Patient describes a dull pain located to the middle of his chest with no radiation.  Reports he also endorses some nausea, has been feeling more short of breath than usual, he is currently not on any supplemental oxygen.  He reports is difficult for him to ambulate to the bathroom, states he is out of breath after taking a shower.  He does endorse some orthopnea, reports he is had to sleep in the recliner the last couple of days.  Patient has been using his nebulizer treatment at home without improvement in symptoms.  He received dialysis Tuesday, Thursdays, Saturdays, last dialyzed Saturday.  He also endorses a productive cough this with some clear sputum.  He has not had any fevers, sick exposures, abdominal pain or back pain.  No prior history of pulmonary embolisms.  The history is provided by the patient.  Chest Pain Associated symptoms: cough and shortness of breath   Associated symptoms: no abdominal pain, no back pain, no fever, no headache, no nausea and no vomiting     Past Medical History:  Diagnosis Date   Acute kidney injury superimposed on CKD ([place]) 05/24/2017   Acute on chronic diastolic CHF (congestive heart failure) ([place]) 04/13/2018   AKI (acute kidney injury) ([place]) 04/10/2015   Asthma    Asthma, chronic, unspecified asthma severity, with acute exacerbation 10/23/2017   Atypical chest pain 02/16/2018   CAP (community acquired pneumonia) 10/23/2017   Cerebral infarction due to thrombosis of cerebral artery ([place])    Cerebrovascular  accident (CVA) ([place])    Chronic kidney disease    CKD (chronic kidney disease), stage V ([place]) 05/24/2017   ESRD (end stage renal disease) on dialysis ([place]) 02/16/2018   Essential hypertension 04/09/2015   GI bleeding 05/24/2017   History of completed stroke    Hyperlipidemia    Hypertension    Hypertensive urgency 05/24/2017   Kidney failure    Respiratory failure, acute ([place]) 10/23/2017   Restless leg syndrome    Stroke ([place])    Symptomatic anemia 05/24/2017    Patient Active Problem List   Diagnosis Date Noted   Chest pain 04/13/2018   Acute on chronic diastolic CHF (congestive heart failure) ([place]) 04/13/2018   Chest pressure    Atypical chest pain 02/16/2018   ESRD on dialysis ([place]) 02/16/2018   Volume overload 10/23/2017   Cough 08/23/2017   Constipation    Macrocytic anemia 05/24/2017   Hypertensive urgency 05/24/2017   History of completed stroke    Essential hypertension 04/09/2015    Past Surgical History:  Procedure Laterality Date   AV FISTULA PLACEMENT Left 05/28/2017   Procedure: LEFT ARM ARTERIOVENOUS (AV) FISTULA CREATION;  Surgeon: Conrad [place], MD;  Location: [facility] OR;  Service: Vascular;  Laterality: Left;   IR FLUORO GUIDE CV LINE RIGHT  05/25/2017   IR US GUIDE VASC ACCESS RIGHT  05/25/2017   RINOPLASTY          Home Medications    Prior to Admission  medications   Medication Sig Start Date End Date Taking? Authorizing Provider  albuterol (PROVENTIL HFA;VENTOLIN HFA) 108 (90 BASE) MCG/ACT inhaler Inhale 1-2 puffs into the lungs every 6 (six) hours as needed for wheezing or shortness of breath. 04/12/15  Yes [name], MD  amLODipine (NORVASC) 10 MG tablet Take 1 tablet (10 mg total) by mouth daily. 04/16/18  Yes Dana Allan I, MD  aspirin EC 81 MG EC tablet Take 1 tablet (81 mg total) by mouth daily. 04/16/18  Yes Bonnell Public, MD  budesonide-formoterol (SYMBICORT) 80-4.5 MCG/ACT inhaler Inhale 2 puffs into the  lungs 2 (two) times daily. 04/15/18  Yes Dana Allan I, MD  carvedilol (COREG) 12.5 MG tablet Take 1 tablet (12.5 mg total) by mouth 2 (two) times daily with a meal. 04/15/18  Yes Bonnell Public, MD  cinacalcet (SENSIPAR) 30 MG tablet Take 30 mg by mouth daily with supper. Do not take less than 12 hours prior to dialysis 01/25/18  Yes [provider]  multivitamin (RENA-VIT) TABS tablet Take 1 tablet by mouth daily. 01/13/18  Yes [provider]  clonazePAM (KLONOPIN) 0.5 MG tablet Take 0.5 mg by mouth at bedtime.     [provider]  gabapentin (NEURONTIN) 100 MG capsule Take 100 mg by mouth at bedtime.     [provider]  hydrALAZINE (APRESOLINE) 100 MG tablet Take 1 tablet (100 mg total) by mouth every 8 (eight) hours. 04/15/18   Bonnell Public, MD  metoCLOPramide (REGLAN) 5 MG tablet Take 5 mg by mouth every 8 (eight) hours as needed for nausea.  03/29/18   [provider]  montelukast (SINGULAIR) 10 MG tablet Take 1 tablet (10 mg total) by mouth at bedtime. Patient not taking: Reported on 05/16/2019 04/15/18   Dana Allan I, MD  pantoprazole (PROTONIX) 40 MG tablet Take 40 mg by mouth daily. 03/29/18   [provider]  polyethylene glycol (MIRALAX / GLYCOLAX) packet Take 17 g by mouth daily as needed. Patient taking differently: Take 17 g by mouth daily as needed for mild constipation.  06/02/17   [name], MD  predniSONE (DELTASONE) 10 MG tablet Prednisone 60 mg daily for 3 days, then 40 mg po daily for 3 days, then 30 mg po daily for 3 days, then 20 mg po daily for 3 days and then 10 mg po daily for 3 days. 04/15/18   Bonnell Public, MD  rOPINIRole (REQUIP) 0.5 MG tablet Take 0.5 mg by mouth 2 (two) times daily. 01/26/18   [provider]  sevelamer (RENAGEL) 800 MG tablet Take 800-1,600 mg by mouth See admin instructions. 1,600mg  by mouth three times daily and 800mg  twice daily with snacks 01/25/18    [provider]    Family History Family History  Problem Relation Age of Onset   Cancer Mother     Social History Social History   Tobacco Use   Smoking status: Never Smoker   Smokeless tobacco: Never Used  Substance Use Topics   Alcohol use: No    Alcohol/week: 0.0 standard drinks    Comment: has a drink once in a while- 07/2018   Drug use: No     Allergies   Patient has no known allergies.   Review of Systems Review of Systems  Constitutional: Negative for fever.  HENT: Negative for sore throat.   Respiratory: Positive for cough and shortness of breath.   Cardiovascular: Positive for chest pain and leg swelling.  Gastrointestinal:  Negative for abdominal pain, nausea and vomiting.  Genitourinary: Negative for difficulty urinating and enuresis.  Musculoskeletal: Negative for back pain.  Skin: Negative for pallor and wound.  Neurological: Negative for headaches.     Physical Exam Updated Vital Signs BP (!) 201/89    Pulse 65    Temp 98.1 F (36.7 C) (Oral)    Resp 17    Ht 5' 7.5" (1.715 m)    Wt 68 kg    SpO2 97%    BMI 23.15 kg/m   Physical Exam Vitals signs and nursing note reviewed.  Constitutional:      Appearance: He is well-developed.  HENT:     Head: Normocephalic and atraumatic.  Eyes:     Pupils: Pupils are equal, round, and reactive to light.  Neck:     Musculoskeletal: Normal range of motion and neck supple.  Cardiovascular:     Rate and Rhythm: Normal rate.     Comments: 2+ pitting edema BLLE especially ankles.  Pulmonary:     Breath sounds: No decreased breath sounds, wheezing or rhonchi.  Musculoskeletal:     Right lower leg: 2+ Edema present.     Left lower leg: 2+ Edema present.  Skin:    General: Skin is warm and dry.  Neurological:     Mental Status: He is alert and oriented to person, place, and time.     Comments: Alert, oriented, thought content appropriate. Speech fluent without evidence of aphasia. Able to  follow 2 step commands without difficulty.  Cranial Nerves:  II:  Peripheral visual fields grossly normal, pupils, round, reactive to light III,IV, VI: ptosis not present, extra-ocular motions intact bilaterally  V,VII: smile symmetric, facial light touch sensation equal VIII: hearing grossly normal bilaterally  IX,X: midline uvula rise  XI: bilateral shoulder shrug equal and strong XII: midline tongue extension  Motor:  5/5 in upper and lower extremities bilaterally including strong and equal grip strength and dorsiflexion/plantar flexion Sensory: light touch normal in all extremities.  Cerebellar: normal finger-to-nose with bilateral upper extremities, pronator drift negative        ED Treatments / Results  Labs (all labs ordered are listed, but only abnormal results are displayed) Labs Reviewed  BASIC METABOLIC PANEL - Abnormal; Notable for the following components:      Result Value   Chloride 97 (*)    BUN 57 (*)    Creatinine, Ser 8.30 (*)    GFR calc non Af Amer 6 (*)    GFR calc Af Amer 7 (*)    Anion gap 16 (*)    All other components within normal limits  CBC - Abnormal; Notable for the following components:   WBC 3.2 (*)    RBC 2.75 (*)    Hemoglobin 10.0 (*)    HCT 30.7 (*)    MCV 111.6 (*)    MCH 36.4 (*)    Platelets 149 (*)    All other components within normal limits  BRAIN NATRIURETIC PEPTIDE - Abnormal; Notable for the following components:   B Natriuretic Peptide >4,500.0 (*)    All other components within normal limits  TROPONIN I (HIGH SENSITIVITY) - Abnormal; Notable for the following components:   Troponin I (High Sensitivity) 42 (*)    All other components within normal limits  TROPONIN I (HIGH SENSITIVITY) - Abnormal; Notable for the following components:   Troponin I (High Sensitivity) 38 (*)    All other components within normal limits  SARS CORONAVIRUS 2 (  TAT 6-24 HRS)    EKG None  Radiology Dg Chest 2 View  Result Date:  05/16/2019 CLINICAL DATA:  Pt here from home with c/o chest pressure non radiating , with chronic sob , pt states that the pain was worse on Saturday , EXAM: CHEST - 2 VIEW COMPARISON:  05/06/2019 FINDINGS: Cardiac silhouette is mildly enlarged. No mediastinal or hilar masses. No evidence of adenopathy. Lungs are hyperexpanded. There are thickened interstitial markings bilaterally including thickening of the fissures, increased when compared to the prior exam. No areas of lung consolidation. No pleural effusion or pneumothorax. Skeletal structures are intact. IMPRESSION: 1. Findings consistent with mild congestive heart failure with interstitial pulmonary edema. No evidence of pneumonia. Electronically Signed   By: Lajean Manes M.D.   On: 05/16/2019 12:50    Procedures Procedures (including critical care time)  Medications Ordered in ED Medications  sodium chloride flush (NS) 0.9 % injection 3 mL (3 mLs Intravenous Not Given 05/16/19 1249)     Initial Impression / Assessment and Plan / ED Course  I have reviewed the triage vital signs and the nursing notes.  Pertinent labs & imaging results that were available during my care of the patient were reviewed by me and considered in my medical decision making (see chart for details).       Patient with a past medical history of asthma presents to the ED with complaints of chest pain along with shortness of breath x2 days.  Patient reports he is currently been using his nebulizer treatment in order to help with his breathing, this has not improved since.  He also reports a clear cough with some sputum.  States he has not taken any medications to help with his symptoms.  He does report sleeping on a recliner the last couple days, does have 2+ pitting edema bilaterally, ankles appear to be very swollen.  BMP without any electrolyte derangement aside from slight decrease in chloride.  Creatinine level is elevated at 8.30, he is currently on dialysis  patient last dialyzed Saturday.  Anion gap of 16 but otherwise unremarkable.  Troponin levels elevated today at 42, suspect likely due to renal disease  CBC shows slight decrease in WBCs hemoglobin is decreased improved from previous visit. DG Chest showed: 1. Findings consistent with mild congestive heart failure with  interstitial pulmonary edema. No evidence of pneumonia.   Patient does not have a previous history of heart failure, he is currently requiring 2 L of oxygen to help with his hypoxia.  New onset hypoxia today, no tachycardia.  BNP was added onto work-up.  Suspect patient's shortness of breath likely coming from new onset heart failure vs pulmonary edema. Spoke to Laboratory chemistry panel . BNP >4,500  3:17 PM Spoke to Dr. Sherryll Burger from nephrology who agrees with consulting patient in the ED. Patient still makes urine. Will now place call for hospitalist admission. Covid testing obtained.   3:43 PM Spoke to family medicine resident who will admit patient for further management.    Portions of this note were generated with [employer]. Dictation errors may occur despite best attempts at proofreading.  Final Clinical Impressions(s) / ED Diagnoses   Final diagnoses:  Shortness of breath  Acute pulmonary edema [facility])    ED Discharge Orders    None       Janeece Fitting, PA-C 05/16/19 1545    Noemi Chapel, MD 05/17/19 0710

## 2019-05-16 NOTE — ED Notes (Signed)
This RN called lab to add on BNP

## 2019-05-16 NOTE — ED Notes (Signed)
Report given to HD.  They are ready to perform pt HD treatment at this time.

## 2019-05-16 NOTE — Consult Note (Signed)
[facility]    NEPHROLOGY CONSULTATION NOTE  PATIENT ID:  Thomas Robinson, DOB:  1961/01/08  HPI: The patient is a 58 y.o. year old male patient with a past medical history significant for end-stage renal disease on hemodialysis Tuesday, Thursday, and Saturday who presented to the emergency department with shortness of breath, chest pain, and fatigue.  He reports he sometimes feels poorly on dialysis due to cramping and headaches, and occasionally leaves dialysis early.  He denies having done this recently.  He reports he has been around his dry weight of 62 to 63 kg when leaving dialysis.  He reports significant orthopnea and lower extremity edema.  Renal consultation has been called for end-stage renal disease on hemodialysis.   Past Medical History:  Diagnosis Date  . Acute kidney injury superimposed on CKD ([place]) 05/24/2017  . Acute on chronic diastolic CHF (congestive heart failure) ([place]) 04/13/2018  . AKI (acute kidney injury) ([place]) 04/10/2015  . Asthma   . Asthma, chronic, unspecified asthma severity, with acute exacerbation 10/23/2017  . Atypical chest pain 02/16/2018  . CAP (community acquired pneumonia) 10/23/2017  . Cerebral infarction due to thrombosis of cerebral artery ([place])   . Cerebrovascular accident (CVA) ([place])   . Chronic kidney disease   . CKD (chronic kidney disease), stage V ([place]) 05/24/2017  . ESRD (end stage renal disease) on dialysis ([place]) 02/16/2018  . Essential hypertension 04/09/2015  . GI bleeding 05/24/2017  . History of completed stroke   . Hyperlipidemia   . Hypertension   . Hypertensive urgency 05/24/2017  . Kidney failure   . Respiratory failure, acute ([place]) 10/23/2017  . Restless leg syndrome   . Stroke ([place])   . Symptomatic anemia 05/24/2017    Past Surgical History:  Procedure Laterality Date  . AV FISTULA PLACEMENT Left 05/28/2017   Procedure: LEFT ARM ARTERIOVENOUS (AV) FISTULA CREATION;  Surgeon: Conrad [place]-Shamrock, MD;  Location: [facility];   Service: Vascular;  Laterality: Left;  . IR FLUORO GUIDE CV LINE RIGHT  05/25/2017  . IR US GUIDE VASC ACCESS RIGHT  05/25/2017  . RINOPLASTY      Family History  Problem Relation Age of Onset  . Cancer Mother     Social History   Tobacco Use  . Smoking status: Never Smoker  . Smokeless tobacco: Never Used  Substance Use Topics  . Alcohol use: No    Alcohol/week: 0.0 standard drinks    Comment: has a drink once in a while- 07/2018  . Drug use: No    REVIEW OF SYSTEMS: General:  no fatigue, no weakness Head:  no headaches Eyes:  no blurred vision ENT:  no sore throat Neck:  no masses CV:  no chest pain, positive orthopnea Lungs: Positive shortness of breath, positive cough GI:  no nausea or vomiting, no diarrhea GU:  no dysuria or hematuria Skin:  no rashes or lesions Neuro:  no focal numbness or weakness Psych:  no depression or anxiety    PHYSICAL EXAM:  Vitals:   05/16/19 1515 05/16/19 1600  BP: (!) 201/97 (!) 216/89  Pulse: 84 62  Resp: (!) 26 19  Temp:    SpO2: (!) 87% 92%   No intake/output data recorded.   General:  AAOx3 NAD HEENT: MMM [place] AT anicteric sclera Neck:  No JVD, no adenopathy CV:  Heart RRR  Lungs: Lung sounds with bibasilar Rales Abd:  abd SNT/ND with normal BS GU:  Bladder non-palpable Extremities: +2 bilateral lower extremity edema,  left forearm AV fistula with good thrill and bruit Skin:  No skin rash Psych:  normal mood and affect Neuro:  no focal deficits   CURRENT MEDICATIONS:  . acetaminophen  650 mg Oral Q6H  . amLODipine  10 mg Oral Daily  . aspirin  81 mg Oral Daily  . carvedilol  12.5 mg Oral BID WC  . [START ON 05/17/2019] Chlorhexidine Gluconate Cloth  6 each Topical Q0600  . cinacalcet  30 mg Oral Q supper  . furosemide  40 mg Intravenous Once  . gabapentin  300 mg Oral QHS  . heparin  5,000 Units Subcutaneous Q8H  . hydrALAZINE  100 mg Oral Q8H  . mometasone-formoterol  2 puff Inhalation BID  . multivitamin  1  tablet Oral Daily  . pantoprazole  40 mg Oral Daily  . rOPINIRole  0.5 mg Oral BID  . sevelamer carbonate  1,600 mg Oral TID WC  . sodium chloride flush  3 mL Intravenous Once     HOME MEDICATIONS:  Prior to Admission medications   Medication Sig Start Date End Date Taking? Authorizing Provider  acetaminophen (TYLENOL) 500 MG tablet Take 1,000 mg by mouth every 6 (six) hours as needed for moderate pain.   Yes [provider]  albuterol (PROVENTIL HFA;VENTOLIN HFA) 108 (90 BASE) MCG/ACT inhaler Inhale 1-2 puffs into the lungs every 6 (six) hours as needed for wheezing or shortness of breath. 04/12/15  Yes [name], MD  amLODipine (NORVASC) 10 MG tablet Take 1 tablet (10 mg total) by mouth daily. 04/16/18  Yes Dana Allan I, MD  aspirin EC 81 MG EC tablet Take 1 tablet (81 mg total) by mouth daily. 04/16/18  Yes Bonnell Public, MD  budesonide-formoterol (SYMBICORT) 80-4.5 MCG/ACT inhaler Inhale 2 puffs into the lungs 2 (two) times daily. 04/15/18  Yes Dana Allan I, MD  carvedilol (COREG) 12.5 MG tablet Take 1 tablet (12.5 mg total) by mouth 2 (two) times daily with a meal. 04/15/18  Yes Bonnell Public, MD  cinacalcet (SENSIPAR) 30 MG tablet Take 30 mg by mouth daily with supper. Do not take less than 12 hours prior to dialysis 01/25/18  Yes [provider]  clonazePAM (KLONOPIN) 0.5 MG tablet Take 0.5 mg by mouth at bedtime.    Yes [provider]  gabapentin (NEURONTIN) 300 MG capsule Take 300 mg by mouth at bedtime. 05/03/19  Yes [provider]  hydrALAZINE (APRESOLINE) 100 MG tablet Take 1 tablet (100 mg total) by mouth every 8 (eight) hours. 04/15/18  Yes Bonnell Public, MD  metoCLOPramide (REGLAN) 5 MG tablet Take 5 mg by mouth every 8 (eight) hours as needed for nausea.  03/29/18  Yes [provider]  multivitamin (RENA-VIT) TABS tablet Take 1 tablet by mouth daily. 01/13/18  Yes [provider]   pantoprazole (PROTONIX) 40 MG tablet Take 40 mg by mouth daily. 03/29/18  Yes [provider]  rOPINIRole (REQUIP) 0.5 MG tablet Take 0.5 mg by mouth 2 (two) times daily. 01/26/18  Yes [provider]  sevelamer (RENAGEL) 800 MG tablet Take 800-1,600 mg by mouth See admin instructions. 1,600mg  by mouth three times daily and 800mg  twice daily with snacks 01/25/18  Yes [provider]  traMADol-acetaminophen (ULTRACET) 37.5-325 MG tablet Take 1 tablet by mouth every 6 (six) hours as needed. for pain 05/06/19  Yes [provider]  DUREZOL 0.05 % EMUL Place 1 drop into the right eye See admin instructions. Start 3 days  before procedure 05/12/19   [provider]  gatifloxacin (ZYMAXID) 0.5 % SOLN Place 1 drop into the right eye See admin instructions. Start 3 days before procedure 05/13/19   [provider]  montelukast (SINGULAIR) 10 MG tablet Take 1 tablet (10 mg total) by mouth at bedtime. Patient not taking: Reported on 05/16/2019 04/15/18   Dana Allan I, MD  polyethylene glycol [facility]) packet Take 17 g by mouth daily as needed. Patient not taking: Reported on 05/16/2019 06/02/17   [name], MD  predniSONE (DELTASONE) 10 MG tablet Prednisone 60 mg daily for 3 days, then 40 mg po daily for 3 days, then 30 mg po daily for 3 days, then 20 mg po daily for 3 days and then 10 mg po daily for 3 days. Patient not taking: Reported on 05/16/2019 04/15/18   Bonnell Public, MD       LABS:  CBC Latest Ref Rng & Units 05/16/2019 07/06/2018 04/15/2018  WBC 4.0 - 10.5 K/uL 3.2(L) 4.7 4.0  Hemoglobin 13.0 - 17.0 g/dL 10.0(L) 8.6(L) 9.7(L)  Hematocrit 39.0 - 52.0 % 30.7(L) 27.5(L) 29.7(L)  Platelets 150 - 400 K/uL 149(L) 113(L) 174    CMP Latest Ref Rng & Units 05/16/2019 07/06/2018 06/07/2018  Glucose 70 - 99 mg/dL 91 107(H) 152(H)  BUN 6 - 20 mg/dL 57(H) 34(H) 79(HH)  Creatinine 0.61 - 1.24 mg/dL 8.30(H) 5.71(H) 9.31(H)  Sodium  135 - 145 mmol/L 139 136 133(L)  Potassium 3.5 - 5.1 mmol/L 4.9 3.8 5.1  Chloride 98 - 111 mmol/L 97(L) 100 90(L)  CO2 22 - 32 mmol/L 26 25 15(L)  Calcium 8.9 - 10.3 mg/dL 10.1 8.7(L) 9.7  Total Protein 6.5 - 8.1 g/dL - 6.6 -  Total Bilirubin 0.3 - 1.2 mg/dL - 0.9 -  Alkaline Phos 38 - 126 U/L - 67 -  AST 15 - 41 U/L - 9(L) -  ALT 0 - 44 U/L - 13 -    Lab Results  Component Value Date   PTH 158 (H) 04/11/2015   CALCIUM 10.1 05/16/2019   CAION 1.15 08/23/2017   PHOS 6.9 (H) 04/15/2018       Component Value Date/Time   COLORURINE YELLOW 05/24/2017 1729   APPEARANCEUR HAZY (A) 05/24/2017 1729   LABSPEC 1.011 05/24/2017 1729   PHURINE 5.0 05/24/2017 1729   GLUCOSEU 50 (A) 05/24/2017 1729   HGBUR SMALL (A) 05/24/2017 1729   BILIRUBINUR NEGATIVE 05/24/2017 1729   KETONESUR NEGATIVE 05/24/2017 1729   PROTEINUR 100 (A) 05/24/2017 1729   UROBILINOGEN 0.2 04/09/2015 1527   NITRITE NEGATIVE 05/24/2017 1729   LEUKOCYTESUR TRACE (A) 05/24/2017 1729      Component Value Date/Time   HCO3 6.3 (L) 05/24/2017 1945   TCO2 16 (L) 08/23/2017 2135   ACIDBASEDEF 22.0 (H) 05/24/2017 1945   O2SAT 68.0 05/24/2017 1945       Component Value Date/Time   IRON 14 (L) 10/24/2017 0202   TIBC 270 10/24/2017 0202   FERRITIN 343 (H) 10/24/2017 0202   IRONPCTSAT 5 (L) 10/24/2017 0202       ASSESSMENT/PLAN:     1.  End-stage renal disease on hemodialysis.  Would continue his usual outpatient hemodialysis prescription on Tuesday, Thursday, and Saturday.  Will plan an extra treatment for ultrafiltration today.  We will plan his usual schedule starting again tomorrow.  Suspect, given his ongoing symptoms on dialysis, that his estimated dry weight has not been aggressively challenged and that his dry weight is much  lower.  2.  Hypertensive urgency.  I suspect this should improve with ultrafiltration on dialysis.  Would resume home medications.  Would also add ACE inhibition if remains hypertensive  afterward.  3.  Anemia of chronic kidney disease.  Hemoglobin is at goal.  Could continue outpatient dosing of ESA.  Would avoid today in the setting of market hypertension.  4.  Acute on chronic diastolic congestive heart failure.  Most recent echocardiogram was June 2019.  Ejection fraction was 55 to 60%.  Should improve with dialysis.      [place], DO, [place]

## 2019-05-16 NOTE — ED Notes (Signed)
Dinner Tray Ordered @ 1755.  

## 2019-05-16 NOTE — ED Notes (Signed)
Pt BP 215/88, O2 sat 87% on RA. This RN placed pt on [place] 3L. Not currently having chest pain

## 2019-05-17 ENCOUNTER — Observation Stay (HOSPITAL_BASED_OUTPATIENT_CLINIC_OR_DEPARTMENT_OTHER): Payer: Medicare Other

## 2019-05-17 ENCOUNTER — Telehealth: Payer: Self-pay

## 2019-05-17 DIAGNOSIS — Z7952 Long term (current) use of systemic steroids: Secondary | ICD-10-CM | POA: Diagnosis not present

## 2019-05-17 DIAGNOSIS — Z9114 Patient's other noncompliance with medication regimen: Secondary | ICD-10-CM | POA: Diagnosis not present

## 2019-05-17 DIAGNOSIS — G8929 Other chronic pain: Secondary | ICD-10-CM | POA: Diagnosis present

## 2019-05-17 DIAGNOSIS — I351 Nonrheumatic aortic (valve) insufficiency: Secondary | ICD-10-CM | POA: Diagnosis not present

## 2019-05-17 DIAGNOSIS — I160 Hypertensive urgency: Secondary | ICD-10-CM | POA: Diagnosis not present

## 2019-05-17 DIAGNOSIS — N186 End stage renal disease: Secondary | ICD-10-CM | POA: Diagnosis present

## 2019-05-17 DIAGNOSIS — Z7951 Long term (current) use of inhaled steroids: Secondary | ICD-10-CM | POA: Diagnosis not present

## 2019-05-17 DIAGNOSIS — I5033 Acute on chronic diastolic (congestive) heart failure: Secondary | ICD-10-CM

## 2019-05-17 DIAGNOSIS — G2581 Restless legs syndrome: Secondary | ICD-10-CM | POA: Diagnosis present

## 2019-05-17 DIAGNOSIS — D72819 Decreased white blood cell count, unspecified: Secondary | ICD-10-CM | POA: Diagnosis present

## 2019-05-17 DIAGNOSIS — Z7982 Long term (current) use of aspirin: Secondary | ICD-10-CM | POA: Diagnosis not present

## 2019-05-17 DIAGNOSIS — Z992 Dependence on renal dialysis: Secondary | ICD-10-CM | POA: Diagnosis not present

## 2019-05-17 DIAGNOSIS — R079 Chest pain, unspecified: Secondary | ICD-10-CM | POA: Diagnosis not present

## 2019-05-17 DIAGNOSIS — Z8673 Personal history of transient ischemic attack (TIA), and cerebral infarction without residual deficits: Secondary | ICD-10-CM | POA: Diagnosis not present

## 2019-05-17 DIAGNOSIS — I161 Hypertensive emergency: Secondary | ICD-10-CM | POA: Diagnosis present

## 2019-05-17 DIAGNOSIS — I361 Nonrheumatic tricuspid (valve) insufficiency: Secondary | ICD-10-CM

## 2019-05-17 DIAGNOSIS — J45909 Unspecified asthma, uncomplicated: Secondary | ICD-10-CM | POA: Diagnosis present

## 2019-05-17 DIAGNOSIS — Z825 Family history of asthma and other chronic lower respiratory diseases: Secondary | ICD-10-CM | POA: Diagnosis not present

## 2019-05-17 DIAGNOSIS — E872 Acidosis: Secondary | ICD-10-CM | POA: Diagnosis present

## 2019-05-17 DIAGNOSIS — D61818 Other pancytopenia: Secondary | ICD-10-CM | POA: Diagnosis not present

## 2019-05-17 DIAGNOSIS — Z79899 Other long term (current) drug therapy: Secondary | ICD-10-CM | POA: Diagnosis not present

## 2019-05-17 DIAGNOSIS — Z20828 Contact with and (suspected) exposure to other viral communicable diseases: Secondary | ICD-10-CM | POA: Diagnosis present

## 2019-05-17 DIAGNOSIS — E785 Hyperlipidemia, unspecified: Secondary | ICD-10-CM | POA: Diagnosis present

## 2019-05-17 DIAGNOSIS — D631 Anemia in chronic kidney disease: Secondary | ICD-10-CM | POA: Diagnosis present

## 2019-05-17 DIAGNOSIS — J810 Acute pulmonary edema: Secondary | ICD-10-CM | POA: Diagnosis not present

## 2019-05-17 DIAGNOSIS — I132 Hypertensive heart and chronic kidney disease with heart failure and with stage 5 chronic kidney disease, or end stage renal disease: Secondary | ICD-10-CM | POA: Diagnosis present

## 2019-05-17 LAB — RENAL FUNCTION PANEL
Albumin: 3.9 g/dL (ref 3.5–5.0)
Anion gap: 15 (ref 5–15)
BUN: 27 mg/dL — ABNORMAL HIGH (ref 6–20)
CO2: 26 mmol/L (ref 22–32)
Calcium: 9.8 mg/dL (ref 8.9–10.3)
Chloride: 97 mmol/L — ABNORMAL LOW (ref 98–111)
Creatinine, Ser: 4.8 mg/dL — ABNORMAL HIGH (ref 0.61–1.24)
GFR calc Af Amer: 14 mL/min — ABNORMAL LOW (ref >60)
GFR calc non Af Amer: 12 mL/min — ABNORMAL LOW (ref >60)
Glucose, Bld: 92 mg/dL (ref 70–99)
Phosphorus: 4.4 mg/dL (ref 2.5–4.6)
Potassium: 3.9 mmol/L (ref 3.5–5.1)
Sodium: 138 mmol/L (ref 135–145)

## 2019-05-17 LAB — CBC WITH DIFFERENTIAL/PLATELET
Abs Immature Granulocytes: 0.02 10*3/uL (ref 0.00–0.07)
Basophils Absolute: 0.1 10*3/uL (ref 0.0–0.1)
Basophils Relative: 2 %
Eosinophils Absolute: 0.1 10*3/uL (ref 0.0–0.5)
Eosinophils Relative: 3 %
HCT: 27.4 % — ABNORMAL LOW (ref 39.0–52.0)
Hemoglobin: 9.6 g/dL — ABNORMAL LOW (ref 13.0–17.0)
Immature Granulocytes: 1 %
Lymphocytes Relative: 8 %
Lymphs Abs: 0.3 10*3/uL — ABNORMAL LOW (ref 0.7–4.0)
MCH: 37.9 pg — ABNORMAL HIGH (ref 26.0–34.0)
MCHC: 35 g/dL (ref 30.0–36.0)
MCV: 108.3 fL — ABNORMAL HIGH (ref 80.0–100.0)
Monocytes Absolute: 0.4 10*3/uL (ref 0.1–1.0)
Monocytes Relative: 9 %
Neutro Abs: 3 10*3/uL (ref 1.7–7.7)
Neutrophils Relative %: 77 %
Platelets: 144 10*3/uL — ABNORMAL LOW (ref 150–400)
RBC: 2.53 MIL/uL — ABNORMAL LOW (ref 4.22–5.81)
RDW: 14.6 % (ref 11.5–15.5)
WBC: 3.8 10*3/uL — ABNORMAL LOW (ref 4.0–10.5)
nRBC: 0 % (ref 0.0–0.2)

## 2019-05-17 LAB — ECHOCARDIOGRAM COMPLETE
Height: 67.5 in
Weight: 2165.8 oz

## 2019-05-17 LAB — CBC
HCT: 27.3 % — ABNORMAL LOW (ref 39.0–52.0)
Hemoglobin: 9.4 g/dL — ABNORMAL LOW (ref 13.0–17.0)
MCH: 36.7 pg — ABNORMAL HIGH (ref 26.0–34.0)
MCHC: 34.4 g/dL (ref 30.0–36.0)
MCV: 106.6 fL — ABNORMAL HIGH (ref 80.0–100.0)
Platelets: 138 10*3/uL — ABNORMAL LOW (ref 150–400)
RBC: 2.56 MIL/uL — ABNORMAL LOW (ref 4.22–5.81)
RDW: 14.5 % (ref 11.5–15.5)
WBC: 3.7 10*3/uL — ABNORMAL LOW (ref 4.0–10.5)
nRBC: 0 % (ref 0.0–0.2)

## 2019-05-17 LAB — MRSA PCR SCREENING: MRSA by PCR: NEGATIVE

## 2019-05-17 LAB — HIV ANTIBODY (ROUTINE TESTING W REFLEX): HIV Screen 4th Generation wRfx: NONREACTIVE

## 2019-05-17 LAB — VITAMIN B12: Vitamin B-12: 459 pg/mL (ref 180–914)

## 2019-05-17 MED ORDER — PRO-STAT SUGAR FREE PO LIQD
30.0000 mL | Freq: Two times a day (BID) | ORAL | Status: DC
  Administered 2019-05-17: 30 mL via ORAL
  Filled 2019-05-17 (×2): qty 30

## 2019-05-17 MED ORDER — NEPRO/CARBSTEADY PO LIQD: 237.0000 mL | Freq: Two times a day (BID) | ORAL | Status: DC

## 2019-05-17 MED ORDER — LIDOCAINE 5 % EX PTCH
1.0000 | MEDICATED_PATCH | Freq: Every day | CUTANEOUS | Status: DC
  Administered 2019-05-17: 04:00:00 1 via TRANSDERMAL
  Filled 2019-05-17: qty 1

## 2019-05-17 MED ORDER — RAMELTEON 8 MG PO TABS
8.0000 mg | ORAL_TABLET | Freq: Every day | ORAL | Status: DC
  Administered 2019-05-17 – 2019-05-18 (×2): 8 mg via ORAL
  Filled 2019-05-17 (×2): qty 1

## 2019-05-17 NOTE — Evaluation (Signed)
Occupational Therapy Evaluation Patient Details Name: Thomas Robinson MRN: [id number] DOB: 1961/04/24 Today's Date: 05/17/2019    History of Present Illness Thomas Robinson is a 58 y.o. male presenting with chest pain and shortness of breath. PMH is significant for ESRD, HFpEF, CVA, macrocytic anemia, hypertension, likely remote alcohol abuse.    Clinical Impression   Pt with decline in function and safety with ADLs and ADL mobility with impaired strength, balance and endurance. Pt reports pain in L lateral neck and shoulder due to chronic back condition that he saw a chiropractor for in the past. Pt reports that PTA, he lived at home alone with his dog that he walked his dog several times a day and that he was independent with ADLs/selfcare, home mgt, was driving and used no AD for mobility. Pt states that sometimes he "sways" when he is up and moving around. Pt currently requires min guard A for ADLs/selfcare sit - stand and standing, min guard A with RW to ambulate in room to bathroom and t stand at sink for functional tasks. Pt would benefit from acute OT services to address impairments to maximize level of function and safety    Follow Up Recommendations  No OT follow up;Supervision - Intermittent    Equipment Recommendations  None recommended by OT    Recommendations for Other Services PT consult     Precautions / Restrictions Precautions Precautions: Fall Restrictions Weight Bearing Restrictions: No      Mobility Bed Mobility Overal bed mobility: Modified Independent             General bed mobility comments: increased time, HOB raised  Transfers Overall transfer level: Needs assistance Equipment used: Rolling walker (2 wheeled) Transfers: Sit to/from Stand Sit to Stand: Min guard         General transfer comment: pt reports that he "sways" sometimes. No LOB observed, but pt unsteady with slower, guarded pace of movement    Balance Overall balance  assessment: Mild deficits observed, not formally tested                                         ADL either performed or assessed with clinical judgement   ADL Overall ADL's : Needs assistance/impaired Eating/Feeding: Independent;Sitting   Grooming: Wash/dry hands;Wash/dry face;Standing;Min guard   Upper Body Bathing: Set up;Sitting   Lower Body Bathing: Min guard;Sit to/from stand   Upper Body Dressing : Set up;Sitting   Lower Body Dressing: Min guard;Sit to/from stand   Toilet Transfer: Min guard;Ambulation;RW   Toileting- Water quality scientist and Hygiene: Min guard;Sit to/from stand   Tub/ Shower Transfer: Min guard;Ambulation;Rolling walker;3 in 1   Functional mobility during ADLs: Min guard;Rolling walker;Cueing for safety       Vision Patient Visual Report: No change from baseline       Perception     Praxis      Pertinent Vitals/Pain Pain Assessment: 0-10 Pain Score: 6  Pain Descriptors / Indicators: Heaviness;Discomfort;Pressure(reports that his chronic back problems cause him pain in his neck and shoulders) Pain Intervention(s): Monitored during session;Repositioned     Hand Dominance Right   Extremity/Trunk Assessment Upper Extremity Assessment Upper Extremity Assessment: Generalized weakness;LUE deficits/detail LUE Deficits / Details: L UE AROM WNL, however pt states that he feels pain in left lateral neck radiating to shoulder and L hand feels numb at times   Lower Extremity Assessment Lower  Extremity Assessment: Defer to PT evaluation       Communication Communication Communication: No difficulties   Cognition Arousal/Alertness: Awake/alert Behavior During Therapy: WFL for tasks assessed/performed Overall Cognitive Status: No family/caregiver present to determine baseline cognitive functioning                                 General Comments: pt A & O x 4, able to carry on approrpriate conversation, however  did not follow throught on oral care activity that he stated he wanted to do even after retrieving his toiletry items from wash basin on the table   General Comments  Pt retrieved oral care items from wash basin on table with min guard A    Exercises     Shoulder Instructions      Home Living Family/patient expects to be discharged to:: Private residence Living Arrangements: Other relatives Available Help at Discharge: Friend(s);Available PRN/intermittently Type of Home: House Home Access: Stairs to enter [employer] of Steps: 2   Home Layout: One level     Bathroom Shower/Tub: Teacher, early years/pre: Standard     Home Equipment: None          Prior Functioning/Environment Level of Independence: Needs assistance  Gait / Transfers Assistance Needed: pt reports that he used no AD for mobiliyt PTA and that he walks his dog several times a day ADL's / Homemaking Assistance Needed: reports that he was independent with ADLs/selfcare, home mgt and was driving            OT Problem List: Decreased activity tolerance;Decreased strength;Impaired balance (sitting and/or standing);Decreased knowledge of use of DME or AE;Pain;Decreased safety awareness;Impaired sensation      OT Treatment/Interventions: Self-care/ADL training;DME and/or AE instruction;Therapeutic activities;Balance training;Therapeutic exercise;Patient/family education    OT Goals(Current goals can be found in the care plan section) Acute Rehab OT Goals Patient Stated Goal: go home OT Goal Formulation: With patient Time For Goal Achievement: 05/31/19 ADL Goals Pt Will Perform Grooming: with supervision;with set-up;with modified independence;standing Pt Will Perform Upper Body Bathing: with modified independence;sitting;standing Pt Will Perform Lower Body Bathing: with supervision;with set-up;with modified independence;sit to/from stand Pt Will Perform Upper Body Dressing: with modified  independence;sitting;standing Pt Will Perform Lower Body Dressing: with supervision;with set-up;with modified independence;sit to/from stand Pt Will Transfer to Toilet: with supervision;with modified independence;ambulating Pt Will Perform Toileting - Clothing Manipulation and hygiene: with supervision;with modified independence;sit to/from stand Pt Will Perform Tub/Shower Transfer: with supervision;with modified independence;ambulating;grab bars Additional ADL Goal #1: Pt will retrieve toiletry and ADL items from drawers and closet with sup in prep for selfcare  OT Frequency: Min 2X/week   Barriers to D/C:    pt lives at home alone       Co-evaluation              AM-PAC OT "6 Clicks" Daily Activity     Outcome Measure Help from another person eating meals?: None Help from another person taking care of personal grooming?: A Little Help from another person toileting, which includes using toliet, bedpan, or urinal?: A Little Help from another person bathing (including washing, rinsing, drying)?: A Little Help from another person to put on and taking off regular upper body clothing?: A Little Help from another person to put on and taking off regular lower body clothing?: A Little 6 Click Score: 19   End of Session Equipment Utilized During Treatment: Gait belt;Rolling  walker  Activity Tolerance: Patient tolerated treatment well Patient left: in bed;with call bell/phone within reach;with bed alarm set  OT Visit Diagnosis: Unsteadiness on feet (R26.81);Other abnormalities of gait and mobility (R26.89);Muscle weakness (generalized) (M62.81);Pain Pain - Right/Left: Left Pain - part of body: Shoulder(neck)                Time: 5423-7023 OT Time Calculation (min): 30 min Charges:  OT General Charges $OT Visit: 1 Visit OT Evaluation $OT Eval Low Complexity: 1 Low OT Treatments $Self Care/Home Management : 8-22 mins    Britt Bottom 05/17/2019, 12:14 PM

## 2019-05-17 NOTE — Telephone Encounter (Signed)
REFERRAL; ON FILE FROM CLAUDIA GETZABETH MERCADO [phone], REFERRAL SENT TO SCHEDULING

## 2019-05-17 NOTE — Progress Notes (Signed)
Pt had 0600 dose of hydralazine 100mg , but pt had also had 100mg  hydralazine PO at 0216.  Paged [facility] on-call provider, who requested a manual BP, which was 184/60.  Re-paged to request parameters/direction on whether to give hydralazine, no response as of the writing of this note.  Hydralazine held at this time and dayshift RN made aware.

## 2019-05-17 NOTE — ED Notes (Signed)
Report was given Erich Montane RN on 5W.  Meds tubed to 5W. Pt will go to 5W from HD

## 2019-05-17 NOTE — Evaluation (Signed)
Physical Therapy Evaluation Patient Details Name: Thomas Robinson MRN: [id number] DOB: [birth year] Today's Date: 05/17/2019   History of Present Illness  Trevyn Lumpkin is a 58 y.o. male presenting with chest pain and shortness of breath. PMH is significant for ESRD, HFpEF, CVA, macrocytic anemia, hypertension, likely remote alcohol abuse.   Clinical Impression  Pt admitted with above diagnosis. Pt's O2 sats fluctuated between 94-96% on RA throughout session. Pt with 2/4 DOE with and without O2. Pt ambulated 100' with min A with several near LOB to L and notable increased tone LLE. Pt reports several falls at home. Recommend OP PT for improving balance and neuromuscular control.  Pt currently with functional limitations due to the deficits listed below (see PT Problem List). Pt will benefit from skilled PT to increase their independence and safety with mobility to allow discharge to the venue listed below.       Follow Up Recommendations Outpatient PT    Equipment Recommendations  Rolling walker with 5" wheels    Recommendations for Other Services       Precautions / Restrictions Precautions Precautions: Fall Precaution Comments: has fallen at home, alcohol has been related to some of his falls but not all Restrictions Weight Bearing Restrictions: No      Mobility  Bed Mobility Overal bed mobility: Modified Independent             General bed mobility comments: increased time, HOB raised  Transfers Overall transfer level: Needs assistance Equipment used: None Transfers: Sit to/from Stand Sit to Stand: Min guard         General transfer comment: took increased time for pt to gain balance. SpO2 dropped to 87% on RA with s<>s, rose to 90's after short rest  Ambulation/Gait Ambulation/Gait assistance: Min assist Gait Distance (Feet): 100 Feet Assistive device: None Gait Pattern/deviations: Step-through pattern;Wide base of support;Staggering left Gait velocity:  decreased Gait velocity interpretation: 1.31 - 2.62 ft/sec, indicative of limited community ambulator General Gait Details: LLE weakness and increased tone notable during gait, near LOB several times. Discussed use of AD but pt resistant to this stating that he is "too young". SpO2 fluctuated between 85-95% on RA  Stairs            Wheelchair Mobility    Modified Rankin (Stroke Patients Only)       Balance Overall balance assessment: Needs assistance Sitting-balance support: No upper extremity supported;Feet supported Sitting balance-Leahy Scale: Good     Standing balance support: No upper extremity supported Standing balance-Leahy Scale: Fair Standing balance comment: limitations since CVA. He reports he did not do any therapy after CVA                             Pertinent Vitals/Pain Pain Assessment: Faces Pain Score: 6  Faces Pain Scale: Hurts little more Pain Location: generalized Pain Descriptors / Indicators: Aching;Discomfort Pain Intervention(s): Limited activity within patient's tolerance    Home Living Family/patient expects to be discharged to:: Private residence Living Arrangements: Other relatives Available Help at Discharge: Friend(s);Available PRN/intermittently Type of Home: House Home Access: Stairs to enter Entrance Stairs-Rails: None Entrance Stairs-Number of Steps: 2 Home Layout: One level Home Equipment: None      Prior Function Level of Independence: Independent   Gait / Transfers Assistance Needed: pt reports he goes to [employer] 5-6x/wk and walks his dog in addition to driving himself to HD  ADL's / Homemaking Assistance Needed:  reports independence        Hand Dominance   Dominant Hand: Right    Extremity/Trunk Assessment   Upper Extremity Assessment Upper Extremity Assessment: Defer to OT evaluation LUE Deficits / Details: L UE AROM WNL, however pt states that he feels pain in left lateral neck radiating to  shoulder and L hand feels numb at times    Lower Extremity Assessment Lower Extremity Assessment: RLE deficits/detail;LLE deficits/detail RLE Deficits / Details: WFL except hamstrings 4-/5 RLE Sensation: WNL RLE Coordination: WNL LLE Deficits / Details: hip flex 3+/5, knee ext 3+/5, knee flex 3+/5, increased tone noted LLE LLE Sensation: decreased proprioception LLE Coordination: decreased gross motor    Cervical / Trunk Assessment Cervical / Trunk Assessment: Normal  Communication   Communication: No difficulties  Cognition Arousal/Alertness: Awake/alert Behavior During Therapy: WFL for tasks assessed/performed Overall Cognitive Status: No family/caregiver present to determine baseline cognitive functioning                                 General Comments: A&O x4 and appropriate with conversation but verbally shows decreased insight into limitations and safety deficits. Reports several falls but denies the need for assistive device or tending to balance issues.       General Comments General comments (skin integrity, edema, etc.): Pt retrieved oral care items from wash basin on table with min guard A    Exercises     Assessment/Plan    PT Assessment Patient needs continued PT services  PT Problem List Decreased strength;Decreased balance;Decreased mobility;Decreased coordination;Decreased cognition;Decreased knowledge of use of DME;Decreased safety awareness;Decreased knowledge of precautions;Pain;Decreased activity tolerance;Impaired tone;Impaired sensation;Cardiopulmonary status limiting activity       PT Treatment Interventions DME instruction;Gait training;Stair training;Functional mobility training;Therapeutic activities;Therapeutic exercise;Neuromuscular re-education;Balance training;Patient/family education;Cognitive remediation    PT Goals (Current goals can be found in the Care Plan section)  Acute Rehab PT Goals Patient Stated Goal: go home PT Goal  Formulation: With patient Time For Goal Achievement: 05/31/19 Potential to Achieve Goals: Good    Frequency Min 3X/week   Barriers to discharge Decreased caregiver support      Co-evaluation               AM-PAC PT "6 Clicks" Mobility  Outcome Measure Help needed turning from your back to your side while in a flat bed without using bedrails?: None Help needed moving from lying on your back to sitting on the side of a flat bed without using bedrails?: None Help needed moving to and from a bed to a chair (including a wheelchair)?: A Little Help needed standing up from a chair using your arms (e.g., wheelchair or bedside chair)?: A Little Help needed to walk in hospital room?: A Little Help needed climbing 3-5 steps with a railing? : A Little 6 Click Score: 20    End of Session Equipment Utilized During Treatment: Gait belt Activity Tolerance: Patient tolerated treatment well Patient left: in bed;with call bell/phone within reach;with bed alarm set Nurse Communication: Mobility status PT Visit Diagnosis: Unsteadiness on feet (R26.81);Repeated falls (R29.6);Difficulty in walking, not elsewhere classified (R26.2)    Time: 5732-2025 PT Time Calculation (min) (ACUTE ONLY): 21 min   Charges:   PT Evaluation $PT Eval Moderate Complexity: [place]  Pager [phone] Office [place] 05/17/2019, 1:50 PM

## 2019-05-17 NOTE — Progress Notes (Signed)
Family Medicine Teaching Service Daily Progress Note Intern Pager: [phone]  Patient name: Thomas Robinson Medical record number: [id number] Date of birth: 1960/11/24 Age: 58 y.o. Gender: male  Primary Care Provider: Charlott Rakes, MD Consultants: Nephrology  Code Status: chest pain    Pt Overview and Major Events to Date:  05/16/19: Admitted to medical telemetry   Assessment and Plan: Thomas Robinson is a 58 y.o. male presenting with chest pain and shortness of breath. PMH is significant for ESRD, HFpEF, CVA, macrocytic anemia, hypertension, likely remote alcohol abuse.      Acute on chronic HFpEF  Patient presented yesterday to the ED after having several months of pain chest pressure and shortness of breath.  Shortness of breath worsened acutely on over the weekend so he came into the emergency department.  Patient does not normally have oxygen at home however has required 3 L of oxygen during his hospitalization.  He reports that he frequently leaves dialysis early.  Yesterday nephrology was consulted who dialyze patient with ultrafiltration.  Patient reported that he left dialysis 20 to 30 minutes early.  He states that he started having funny feelings in his hands and had a headache.  Patient is below his estimated dry weight (63 kg) today (61.4 kg).  Per nephrology, plan for hemodialysis and patient to resume his Tues /Thurs / Sat schedule thereafter. ECHO EF >65% with severely increased left ventricular wall thickness and impaired LV relaxation, left atrial severe dilation, moderate aortic regurg and inferior vena cava dilation.  Patient likely still volume overloaded.  - Dialysis tomorrow AM -Vitals per routine -Daily renal function panels -Strict I's and O's -Daily weights -Wean oxygen as tolerated -Nephrology consulted, appreciate recommendations     Hypertension  Hypertensive emergency, likely due to volume overload and missed medications.  Patient's blood pressure  significantly elevated on admission.  Blood pressures since admission have ranged between 216/89-142/71 and most recently 180/68.  Home meds include Coreg 12.5 mg twice daily, hydralazine 100 mg 3 times a day and amlodipine 10 mg daily.  Patient to have dialysis to relieve fluid burden.  Nephrology following. - Continue home meds - Monitor blood pressures -Per nephrology, consider ACE inhibitor if patient remains hypertensive after dialysis     ESRD  T/Th/Sat  Last dialysis Sat.  Patient received ultrafiltration with we will plan for dialysis tomorrow. ESRD dialysis due to hypertensive renal disease.  Takes renal vitamin, Renvela 1600mg  threes times daily, and Cinacalcet at 30 mg.  Nephrology following.   -Continue home medications -Nephrology following, appreciate recommendations    Chronic upper extremity pain Patient received a K pad for left shoulder and left-sided neck pain is improved on exam.  Patient with chronic left upper extremity numbness and tingling after a fall and CVA. Home meds include gabapentin 300 mg at bedtime -Continue home medications   Restless leg syndrome Home regimen includes ropinirole 0.5 mg twice daily -Continue home medications   Asthma  Patient reports worsening of shortness of breath with cough and white sputum.  COVID negative and chest x-ray negative for pneumonia.  Describes no relief at home with albuterol nebulizers.  Does not use oxygen at baseline.  Patient using 2 L is nasal cannula.  Home regimen includes albuterol nebulizers, symbicort. -Continue home medications (Dulera per hospital formulary) -Wean oxygen as tolerated   Anemia Patient with history of GI bleed in 2018 required blood transfusion, history of anemia of chronic disease (ESRD), macrocytic anemia also.  Patient with shortness of breath  today. Hgb 9.4 , MCV 106.6.  With significant elevation in MCV we will obtain Folate and B12 labs.    B12 within normal limits.  Folate pending.   Macrocytic anemia may be due to history of alcohol use.  Will also monitor leukopenia, WBC 3.2 on admission and today 3.7. -Folate pending -CBC   Hx of CVA  Patient had a stroke in August 2016 with MRI that showed small acute lacunar infarct of the right.  Residual left upper extremity anesthesias and occasional numbness.  -81 mg ASA      FEN/GI: renal diet, dialysis  Prophylaxis: Heparin   Disposition: home pending medical clearance   Subjective:  Overnight patient complained of left shoulder and neck pain.  Provided a lidocaine patch patient reports no left shoulder and neck pain currently.  Continues to wear oxygen on exam.  Objective: Temp:  [97.5 F (36.4 C)-98.1 F (36.7 C)] 97.9 F (36.6 C) (09/15 0205) Pulse Rate:  [56-87] 69 (09/15 0842) Resp:  [15-26] 18 (09/15 0842) BP: (142-216)/(56-129) 184/60 (09/15 0638) SpO2:  [85 %-99 %] 94 % (09/15 0843) Weight:  [61.4 kg-68 kg] 61.4 kg (09/15 0056)  Physical Exam: GEN: Conversational, male sitting upright comfortable and in no acute distress CV: regular rate and rhythm, murmur appreciated, distal pulses intact RESP: no increased work of breathing, wearing 3 L nasal cannula, bilateral rales  ABD: Soft, Nontender, Nondistended.  MSK: 1+ bilateral lower extremity edema, or cyanosis noted SKIN: warm, dry NEURO: grossly normal, moves all extremities appropriately PSYCH: Normal affect and thought content   Laboratory: Recent Labs  Lab 05/16/19 1211 05/17/19 0203  WBC 3.2* 3.7*  HGB 10.0* 9.4*  HCT 30.7* 27.3*  PLT 149* 138*   Recent Labs  Lab 05/16/19 1211 05/17/19 0203  NA 139 138  K 4.9 3.9  CL 97* 97*  CO2 26 26  BUN 57* 27*  CREATININE 8.30* 4.80*  CALCIUM 10.1 9.8  GLUCOSE 91 92      Imaging/Diagnostic Tests: Dg Chest 2 View  Result Date: 05/16/2019 CLINICAL DATA:  Pt here from home with c/o chest pressure non radiating , with chronic sob , pt states that the pain was worse on Saturday , EXAM:  CHEST - 2 VIEW COMPARISON:  05/06/2019 FINDINGS: Cardiac silhouette is mildly enlarged. No mediastinal or hilar masses. No evidence of adenopathy. Lungs are hyperexpanded. There are thickened interstitial markings bilaterally including thickening of the fissures, increased when compared to the prior exam. No areas of lung consolidation. No pleural effusion or pneumothorax. Skeletal structures are intact. IMPRESSION: 1. Findings consistent with mild congestive heart failure with interstitial pulmonary edema. No evidence of pneumonia. Electronically Signed   By: Lajean Manes M.D.   On: 05/16/2019 12:50   Dg Chest 2 View  Result Date: 05/06/2019 CLINICAL DATA:  Intermittent left-sided pleuritic chest pain and shortness of breath for the past 6 months. EXAM: CHEST - 2 VIEW COMPARISON:  Cardiac CT dated June 14, 2018. Chest x-ray dated April 14, 2018. FINDINGS: Unchanged moderate cardiomegaly. Normal mediastinal contours. Atherosclerotic calcification of the aortic arch. Normal pulmonary vascularity. Unchanged scarring/atelectasis in the left lower lobe. No pleural effusion or pneumothorax. No acute osseous abnormality. IMPRESSION: 1. No active cardiopulmonary disease. Unchanged scarring/atelectasis in the left lower lobe. Electronically Signed   By: Titus Dubin M.D.   On: 05/06/2019 15:53   Mr Cervical Spine Wo Contrast  Result Date: 04/26/2019 CLINICAL DATA:  Left neck and arm pain EXAM: MRI CERVICAL SPINE WITHOUT CONTRAST TECHNIQUE:  Multiplanar, multisequence MR imaging of the cervical spine was performed. No intravenous contrast was administered. COMPARISON:  Cervical radiography 12/03/2018 FINDINGS: Alignment: 2 mm of retrolisthesis at C6-7. Vertebrae: Marrow edema in the left facets at C3-4 and right facets at C4-5. No bone lesion or discitis. Cord: Multilevel degenerative cord compression without signal abnormality. Posterior Fossa, vertebral arteries, paraspinal tissues: Negative Disc levels:  C2-3: Unremarkable. C3-4: Facet osteoarthritis with asymmetric left-sided spurring and marrow edema. Bilateral uncovertebral ridging and advanced foraminal stenosis. Central disc protrusion with mild cord flattening C4-5: Degenerative facet spurring asymmetric to the right where there is marrow edema. Borderline anterolisthesis. The disc is narrowed with endplate and uncovertebral ridging. Biforaminal impingement and spinal stenosis with cord flattening C5-6: Degenerative disc narrowing with bulging and endplate ridging. Mild facet spurring. Spinal stenosis with cord flattening. Left more than right foraminal impingement C6-7: Disc narrowing and endplate degeneration with central protrusion compressing the cord against the mildly thickened ligamentum flavum. Uncovertebral spurring with severe biforaminal impingement C7-T1:Unremarkable. IMPRESSION: 1. Degenerative disease causes biforaminal impingement and spinal stenosis with cord deformity from C3-4 to C6-7, cord compression greatest at C6-7. 2. Superimposed active facet arthritis on the left at C3-4 and right at C4-5. Electronically Signed   By: Monte Fantasia M.D.   On: 04/26/2019 10:52     Lyndee Hensen, MD 05/17/2019, 9:56 AM PGY-1, [place] Intern pager: [phone], text pages welcome

## 2019-05-17 NOTE — Progress Notes (Signed)
Initial Nutrition Assessment  DOCUMENTATION CODES:   Not applicable  INTERVENTION:   -D/c Nepro Shake po BID, each supplement provides 425 kcal and 19 grams protein -30 ml Prostat BID, each supplement provides 100 kcals and 15 grams  Protein -Continue rena-vit daily  NUTRITION DIAGNOSIS:   Increased nutrient needs related to chronic illness as evidenced by estimated needs(ESRD on HD).  GOAL:   Patient will meet greater than or equal to 90% of their needs  MONITOR:   PO intake, Supplement acceptance, Labs, Weight trends, Skin, I & O's  REASON FOR ASSESSMENT:   Malnutrition Screening Tool    ASSESSMENT:   Thomas Robinson is a 58 y.o. male presenting with chest pain and shortness of breath. PMH is significant for ESRD, HFpEF, CVA, macrocytic anemia, hypertension, likely remote alcohol abuse.  Pt admitted with hypotension and CHF exacerbation.   Pt in with therapies at time of visit. Unable to speak with pt to obtain further nutritional history at this time.   Per nephrology notes, pt routinely signs off early during HD sessions. EDW is 63 kg; suspect pt may be losing body weight secondary to being below body weight even when signing off early for treatments. Pt appeared frail on appearance. Wt has been stable this year, however, suspect fluid changes may be masking true weight loss.   Pt with good appetite currently. Noted meal completion 75-100%. Pt is refusing Nepro supplements per MAR.   Plan for possible discharge tomorrow after HD. Noted palliative care consult pending for goals of care.   Labs reviewed.   Diet Order:   Diet Order            Diet renal with fluid restriction Fluid restriction: 1200 mL Fluid; Room service appropriate? Yes; Fluid consistency: Thin  Diet effective now              EDUCATION NEEDS:   No education needs have been identified at this time  Skin:  Skin Assessment: Reviewed RN Assessment  Last BM:  05/16/19  Height:   Ht  Readings from Last 1 Encounters:  05/16/19 5' 7.5" (1.715 m)    Weight:   Wt Readings from Last 1 Encounters:  05/17/19 61.4 kg    Ideal Body Weight:  68.6 kg  BMI:  Body mass index is 20.89 kg/m.  Estimated Nutritional Needs:   Kcal:  1850-2050  Protein:  95-110 grams  Fluid:  1000 ml + UOP    Ole Lafon A. Jimmye Norman, RD, LDN, [place] Registered Dietitian II Certified Diabetes Care and Education Specialist Pager: [phone] After hours Pager: [phone]

## 2019-05-17 NOTE — Progress Notes (Signed)
  Echocardiogram 2D Echocardiogram has been performed.  Thomas Robinson 05/17/2019, 8:41 AM

## 2019-05-17 NOTE — Progress Notes (Signed)
Per MD hold 6 am hydralazine pt is due to go back to HD today.

## 2019-05-17 NOTE — Progress Notes (Addendum)
Subjective:  In room ,feels better , HD last night till 1 am  uf tolerated   Objective Vital signs in last 24 hours: Vitals:   05/17/19 0627 05/17/19 0638 05/17/19 0842 05/17/19 0843  BP: (!) 197/56 (!) 184/60    Pulse: 64  69   Resp:   18   Temp:      TempSrc:      SpO2:   (!) 85% 94%  Weight:      Height:       Weight change:   Physical Exam: General:  thin male AAOx3 NAD Neck:  No JVD, no adenopathy CV:  Heart RRR  Lungs: CTA ,nonlabored breathing  Abd:  abd SNT/ND with normal BS Extremities: +1 bilateral lower extremity edema,  Dialysis Access: left forearm AV fistula with good thrill and bruit    [facility]  TTS  3hr 45 min , Edw  63 kg , 2k/ 2ca  Hep 1800 bolus and mid  tx 1800  LFA AVF  Hec 4 Mircera 225 mcg q 2wks (last given 05/12/19) Venofer 50mg  q tx  Problem/Plan: 1. Vol overoad/ HTN Urgency= HD last night UF 2430 cc  Below edw now , paln for HD tomor 1st shift  Needs lower edw ( NOTED he has not missed any txs BUT MISSING FULL TX Time every tx in sept and chronically playing role in his admit ) Probably losing body wt, plan HD in am and if hypoxemia resolved can prob be dc'd thereafter.  2. ESRD - HD TTS had hd until ~1 am , as Dw with Pt hd in am and dc if stable then back to hd Thurs at op  3. Anemia - HGB 9.4  ESA next on 9/24 due / weekly Fe  Thursday 4. Secondary hyperparathyroidism - Hec on hd  / binder ./ sensipar  5. HO CVA  04/2015 , have dw him multiple times =compliance of tx time at [facility] and Dr Lorrene Reid with  risks of missing tx time including recurrent CVA   Ernest Haber, PA-C [place] [phone] 05/17/2019,12:49 PM  LOS: 0 days   Pt seen, examined and agree w assess/plan as above with additions as indicated.  [facility] 05/17/2019, 3:01 PM     Labs: Basic Metabolic Panel: Recent Labs  Lab 05/16/19 1211 05/17/19 0203  NA 139 138  K 4.9 3.9  CL 97* 97*  CO2 26 26   GLUCOSE 91 92  BUN 57* 27*  CREATININE 8.30* 4.80*  CALCIUM 10.1 9.8  PHOS  --  4.4   Liver Function Tests: Recent Labs  Lab 05/17/19 0203  ALBUMIN 3.9   No results for input(s): LIPASE, AMYLASE in the last 168 hours. No results for input(s): AMMONIA in the last 168 hours. CBC: Recent Labs  Lab 05/16/19 1211 05/17/19 0203  WBC 3.2* 3.7*  HGB 10.0* 9.4*  HCT 30.7* 27.3*  MCV 111.6* 106.6*  PLT 149* 138*   Cardiac Enzymes: No results for input(s): CKTOTAL, CKMB, CKMBINDEX, TROPONINI in the last 168 hours. CBG: No results for input(s): GLUCAP in the last 168 hours.  Studies/Results: Dg Chest 2 View  Result Date: 05/16/2019 CLINICAL DATA:  Pt here from home with c/o chest pressure non radiating , with chronic sob , pt states that the pain was worse on Saturday , EXAM: CHEST - 2 VIEW COMPARISON:  05/06/2019 FINDINGS: Cardiac silhouette is mildly enlarged. No mediastinal or hilar masses. No  evidence of adenopathy. Lungs are hyperexpanded. There are thickened interstitial markings bilaterally including thickening of the fissures, increased when compared to the prior exam. No areas of lung consolidation. No pleural effusion or pneumothorax. Skeletal structures are intact. IMPRESSION: 1. Findings consistent with mild congestive heart failure with interstitial pulmonary edema. No evidence of pneumonia. Electronically Signed   By: Lajean Manes M.D.   On: 05/16/2019 12:50   Medications: . sodium chloride    . sodium chloride     . acetaminophen  650 mg Oral Q6H  . amLODipine  10 mg Oral Daily  . aspirin EC  81 mg Oral Daily  . carvedilol  12.5 mg Oral BID WC  . Chlorhexidine Gluconate Cloth  6 each Topical Q0600  . cinacalcet  30 mg Oral Q supper  . feeding supplement (NEPRO CARB STEADY)  237 mL Oral BID BM  . gabapentin  300 mg Oral QHS  . heparin  5,000 Units Subcutaneous Q8H  . hydrALAZINE  100 mg Oral Q8H  . lidocaine  1 patch Transdermal Q24H  . lidocaine  1 patch  Transdermal Daily  . mometasone-formoterol  2 puff Inhalation BID  . multivitamin  1 tablet Oral QHS  . pantoprazole  40 mg Oral Daily  . ramelteon  8 mg Oral QHS  . rOPINIRole  0.5 mg Oral BID  . sevelamer carbonate  1,600 mg Oral TID WC  . sodium chloride flush  3 mL Intravenous Once

## 2019-05-18 DIAGNOSIS — D61818 Other pancytopenia: Secondary | ICD-10-CM

## 2019-05-18 LAB — RENAL FUNCTION PANEL
Albumin: 3.5 g/dL (ref 3.5–5.0)
Anion gap: 15 (ref 5–15)
BUN: 46 mg/dL — ABNORMAL HIGH (ref 6–20)
CO2: 23 mmol/L (ref 22–32)
Calcium: 9.9 mg/dL (ref 8.9–10.3)
Chloride: 100 mmol/L (ref 98–111)
Creatinine, Ser: 7.34 mg/dL — ABNORMAL HIGH (ref 0.61–1.24)
GFR calc Af Amer: 8 mL/min — ABNORMAL LOW (ref >60)
GFR calc non Af Amer: 7 mL/min — ABNORMAL LOW (ref >60)
Glucose, Bld: 93 mg/dL (ref 70–99)
Phosphorus: 6.8 mg/dL — ABNORMAL HIGH (ref 2.5–4.6)
Potassium: 4.3 mmol/L (ref 3.5–5.1)
Sodium: 138 mmol/L (ref 135–145)

## 2019-05-18 LAB — CBC
HCT: 25.9 % — ABNORMAL LOW (ref 39.0–52.0)
Hemoglobin: 8.9 g/dL — ABNORMAL LOW (ref 13.0–17.0)
MCH: 36.6 pg — ABNORMAL HIGH (ref 26.0–34.0)
MCHC: 34.4 g/dL (ref 30.0–36.0)
MCV: 106.6 fL — ABNORMAL HIGH (ref 80.0–100.0)
Platelets: 140 10*3/uL — ABNORMAL LOW (ref 150–400)
RBC: 2.43 MIL/uL — ABNORMAL LOW (ref 4.22–5.81)
RDW: 14.3 % (ref 11.5–15.5)
WBC: 3 10*3/uL — ABNORMAL LOW (ref 4.0–10.5)
nRBC: 0 % (ref 0.0–0.2)

## 2019-05-18 LAB — FOLATE RBC
Folate, Hemolysate: >620 ng/mL
Folate, RBC: >2271 ng/mL (ref >498)
Hematocrit: 27.3 % — ABNORMAL LOW (ref 37.5–51.0)

## 2019-05-18 LAB — HEPATITIS B SURFACE ANTIGEN: Hepatitis B Surface Ag: NEGATIVE

## 2019-05-18 MED ORDER — BUDESONIDE-FORMOTEROL FUMARATE 80-4.5 MCG/ACT IN AERO: 2.0000 | INHALATION_SPRAY | Freq: Two times a day (BID) | RESPIRATORY_TRACT | 1 refills | Status: DC

## 2019-05-18 MED ORDER — LIDOCAINE HCL (PF) 1 % IJ SOLN: 5.0000 mL | INTRAMUSCULAR | Status: DC | PRN

## 2019-05-18 MED ORDER — LIDOCAINE-PRILOCAINE 2.5-2.5 % EX CREA: 1.0000 "application " | TOPICAL_CREAM | CUTANEOUS | Status: DC | PRN

## 2019-05-18 MED ORDER — PENTAFLUOROPROP-TETRAFLUOROETH EX AERO: 1.0000 "application " | INHALATION_SPRAY | CUTANEOUS | Status: DC | PRN

## 2019-05-18 MED ORDER — SODIUM CHLORIDE 0.9 % IV SOLN: 100.0000 mL | INTRAVENOUS | Status: DC | PRN

## 2019-05-18 MED ORDER — CARVEDILOL 12.5 MG PO TABS: 12.5000 mg | ORAL_TABLET | Freq: Two times a day (BID) | ORAL | 0 refills | Status: DC

## 2019-05-18 MED ORDER — HYDRALAZINE HCL 100 MG PO TABS: 100.0000 mg | ORAL_TABLET | Freq: Three times a day (TID) | ORAL | 0 refills | Status: DC

## 2019-05-18 MED ORDER — AMLODIPINE BESYLATE 10 MG PO TABS: 10.0000 mg | ORAL_TABLET | Freq: Every day | ORAL | 0 refills | Status: DC

## 2019-05-18 NOTE — Progress Notes (Addendum)
Upon arriving back in patient's room. I informed him of the doctor's writing his discharge orders. Pt states " I'm in the middle of leaving right now". I asked the patient to stay until his orders were in and advised him that otherwise he would be leaving against medical advice. Pt signed AMA form. IV site was removed. Pt removed his own telebox. Telebox was cleaned and returned to the front. Pt refused to be wheeled downstairs. Pt refused to let nurse assess him. All belongings were sent home with patient.

## 2019-05-18 NOTE — Discharge Instructions (Signed)
You received HD with improvement in your symptoms. We are so happy about this! PLEASE BE SURE TO GO TO YOUR DIALYSIS AS SCHEDULED TOMORROW!  We hope you have a great birthday!!   Take care [facility]

## 2019-05-18 NOTE — Discharge Summary (Signed)
[facility] Discharge Summary  Patient name: Thomas Robinson Medical record number: [id number] Date of birth: 1960-10-06 Age: 58 y.o. Gender: male Date of Admission: 05/16/2019  Date of Discharge: 05/18/19  Admitting Physician: Martyn Malay, MD  Primary Care Provider: Charlott Rakes, MD Consultants: Nephrology  Indication for Hospitalization: Dyspnea related to exacerbation of HFpEF  Discharge Diagnoses/Problem List:  Acute on chronic HFpEF Hypertension emergency ESRD Chronic upper extremity pain Restless leg syndrome Asthma Anemia CVA history   Disposition: Home  Discharge Condition: Improved  Discharge Exam:   GEN:     Pleasant, non-ill-appearing male sitting upright in bed  HENT:  mucus membranes moist, oropharyngeal without lesions or erythema  EYES:   pupils equal and reactive, EOM intact NECK:  supple, normal ROM, no lymphadenopathy  RESP:  clear to auscultation bilaterally, no increased work of breathing  CVS:   regular rate and rhythm, murmur appreciated, distal pulses intact, good thrill in fistula ABD:  soft, non-tender; bowel sounds present; no palpable masses EXT:   normal ROM, atraumatic, no appreciable edema NEURO:  normal without focal findings,  speech normal, alert and oriented   Skin:   warm and dr, normal skin turgor Psych: Normal affect and thought content     Brief Hospital Course:  Thomas Robinson was seen in the ED for increasing shortness of breath and chest pressure that acutely worsened over the weekend.  He reported orthopnea and paroxysmal nocturnal dyspnea for the past several months. Patient reported he occasionally stoped his dialysis early when he starts to have numbness and tingling in his upper extremities. He required 3 L of oxygen which he does not use at baseline.  In the ED, his BNP was greater than 4500 and his chest x-ray was consistent with congestive heart failure.  ECHO showed EF >65% with severe  LV wall thickness and an dilated IVC consistent with HFpEF and volume overload. His blood pressures during his stay ranged from 229/89-150 9/79.  He admits that he occasionally forgets to take his medicine, if he does not eat. Exacerbation of his CHF likely due to excess volume in the setting of nonadherence to medication and leaving hemodialysis early. Nephrology was consulted and patient received ultrafiltration and subsequent dialysis. Patient no longer requires oxygen was weaned to room air. He is medically stable for discharge. Patient is to resume his T/Th/Sat schedule in the outpatient setting.    Issues for Follow Up:  1. Patient is nonadherent to his blood pressure medication.  Recommend further counseling patient on need to adhere to blood pressure medication. 2. Patient ports he often leaves hemodialysis early.  Nephrology noted that patient may have lost body weight and and dry weight may be changed.  He is 59.1 kg today and his reported dry weight was 63 kg.  Nephrology recommended patient new dry weight be 58 kg.  Please see to it that patient knows his knew dry weight and his nephrologist notes as well. 3. Patient was evaluated by PT who recommended patient have a rolling walker with 5 inch wheels.  Please see to it the patient receives this device.   Significant Procedures: Hemodialysis   Significant Labs and Imaging:  Recent Labs  Lab 05/17/19 0203 05/17/19 1730 05/18/19 0231  WBC 3.7* 3.8* 3.0*  HGB 9.4* 9.6* 8.9*  HCT 27.3*  27.3* 27.4* 25.9*  PLT 138* 144* 140*   Recent Labs  Lab 05/16/19 1211 05/17/19 0203 05/18/19 0231  NA 139 138 138  K 4.9 3.9 4.3  CL 97* 97* 100  CO2 26 26 23   GLUCOSE 91 92 93  BUN 57* 27* 46*  CREATININE 8.30* 4.80* 7.34*  CALCIUM 10.1 9.8 9.9  PHOS  --  4.4 6.8*  ALBUMIN  --  3.9 3.5   BNP:  > 4500  CXR 05/16/19: IMPRESSION: Findings consistent with mild congestive heart failure with interstitial pulmonary edema. No evidence of  pneumonia.  ECHO 05/17/15: IMPRESSIONS  1. The left ventricle has hyperdynamic systolic function, with an ejection fraction of >65%. The cavity size was normal. There is severely increased left ventricular wall thickness. Left ventricular diastolic Doppler parameters are consistent with impaired relaxation. Elevated left atrial and left ventricular end-diastolic pressures The E/e' is >15. No evidence of left ventricular regional wall motion abnormalities.  2. The right ventricle has normal systolic function. The cavity was normal. There is no increase in right ventricular wall thickness.  3. Left atrial size was severely dilated.  4. The pericardial effusion is anterior to the right ventricle.  5. Trivial pericardial effusion is present.  6. The mitral valve is abnormal. Mild thickening of the mitral valve leaflet.  7. The tricuspid valve is grossly normal.  8. The aortic valve is tricuspid. Mild sclerosis of the aortic valve. Aortic valve regurgitation is moderate by color flow Doppler.  9. The aorta is normal unless otherwise noted. 10. The inferior vena cava was dilated in size with >50% respiratory variability.  Results/Tests Pending at Time of Discharge: None  Discharge Medications:  Allergies as of 05/18/2019   No Known Allergies     Medication List    STOP taking these medications   clonazePAM 0.5 MG tablet Commonly known as: KLONOPIN   metoCLOPramide 5 MG tablet Commonly known as: REGLAN   montelukast 10 MG tablet Commonly known as: SINGULAIR   predniSONE 10 MG tablet Commonly known as: DELTASONE     TAKE these medications   acetaminophen 500 MG tablet Commonly known as: TYLENOL Take 1,000 mg by mouth every 6 (six) hours as needed for moderate pain.   albuterol 108 (90 Base) MCG/ACT inhaler Commonly known as: VENTOLIN HFA Inhale 1-2 puffs into the lungs every 6 (six) hours as needed for wheezing or shortness of breath.   amLODipine 10 MG tablet Commonly known as:  NORVASC Take 1 tablet (10 mg total) by mouth daily.   aspirin 81 MG EC tablet Take 1 tablet (81 mg total) by mouth daily.   budesonide-formoterol 80-4.5 MCG/ACT inhaler Commonly known as: Symbicort Inhale 2 puffs into the lungs 2 (two) times daily.   carvedilol 12.5 MG tablet Commonly known as: COREG Take 1 tablet (12.5 mg total) by mouth 2 (two) times daily with a meal.   cinacalcet 30 MG tablet Commonly known as: SENSIPAR Take 30 mg by mouth daily with supper. Do not take less than 12 hours prior to dialysis   Durezol 0.05 % Emul Generic drug: Difluprednate Place 1 drop into the right eye See admin instructions. Start 3 days before procedure   gabapentin 300 MG capsule Commonly known as: NEURONTIN Take 300 mg by mouth at bedtime.   gatifloxacin 0.5 % Soln Commonly known as: ZYMAXID Place 1 drop into the right eye See admin instructions. Start 3 days before procedure   hydrALAZINE 100 MG tablet Commonly known as: APRESOLINE Take 1 tablet (100 mg total) by mouth every 8 (eight) hours.   multivitamin Tabs tablet Take 1 tablet by mouth daily.   pantoprazole 40 MG  tablet Commonly known as: PROTONIX Take 40 mg by mouth daily.   polyethylene glycol 17 g packet Commonly known as: MIRALAX / GLYCOLAX Take 17 g by mouth daily as needed.   rOPINIRole 0.5 MG tablet Commonly known as: REQUIP Take 0.5 mg by mouth 2 (two) times daily.   sevelamer 800 MG tablet Commonly known as: RENAGEL Take 800-1,600 mg by mouth See admin instructions. 1,600mg  by mouth three times daily and 800mg  twice daily with snacks   traMADol-acetaminophen 37.5-325 MG tablet Commonly known as: ULTRACET Take 1 tablet by mouth every 6 (six) hours as needed. for pain       Discharge Instructions: Please refer to Patient Instructions section of EMR for full details.  Patient was counseled important signs and symptoms that should prompt return to medical care, changes in medications, dietary  instructions, activity restrictions, and follow up appointments.   Follow-Up Appointments:   Lyndee Hensen, MD 05/18/2019, 5:58 PM PGY-1, [place]

## 2019-05-18 NOTE — Progress Notes (Signed)
Pt threatening to leave AMA and requesting IV to be removed. Notified [facility] on call. Awaiting discharge orders.

## 2019-05-18 NOTE — Progress Notes (Signed)
Subjective:  HD this am, 2L off, came off at 59kg  Objective Vital signs in last 24 hours: Vitals:   05/18/19 0830 05/18/19 0900 05/18/19 0930 05/18/19 0946  BP: (!) 192/105 (!) 163/88 (!) 193/95 (!) 188/89  Pulse: 77 76 88 77  Resp:    20  Temp:    98.7 F (37.1 C)  TempSrc:    Oral  SpO2:    97%  Weight:    59.1 kg  Height:       Weight change: -6.939 kg  Physical Exam: General:  thin male AAOx3 NAD Neck:  No JVD, no adenopathy CV:  Heart RRR  Lungs: CTA ,nonlabored breathing  Abd:  abd SNT/ND with normal BS Extremities: +1 bilateral lower extremity edema,  Dialysis Access: left forearm AV fistula with good thrill and bruit    OP D[facility] = [facility]  TTS  3hr 45 min , Edw  63 kg , 2k/ 2ca  Hep 1800 bolus and mid  tx 1800  LFA AVF  Hec 4 Mircera 225 mcg q 2wks (last given 05/12/19) Venofer 50mg  q tx  Problem/Plan: 1. Vol overoad/ HTN Urgency- due mostly to lean body wt loss. Got wt's down to 59kg here w/ HD x 2, 4kg under.  Would plan to set OP edw a bit lower at 58 kg upon dc today.  OK for dc today after HD this am.  2. ESRD - HD TTS 3. Anemia - HGB 9.4  ESA next on 9/24 due / weekly Fe  Thursday 4. Secondary hyperparathyroidism - Hec on hd  / binder ./ sensipar  5. HO CVA  04/2015 , have dw him multiple times =compliance of tx time at [facility] and Dr Lorrene Reid with  risks of missing tx time including recurrent CVA   [name], MD 05/18/2019, 1:10 PM    Labs: Basic Metabolic Panel: Recent Labs  Lab 05/16/19 1211 05/17/19 0203 05/18/19 0231  NA 139 138 138  K 4.9 3.9 4.3  CL 97* 97* 100  CO2 26 26 23   GLUCOSE 91 92 93  BUN 57* 27* 46*  CREATININE 8.30* 4.80* 7.34*  CALCIUM 10.1 9.8 9.9  PHOS  --  4.4 6.8*   Liver Function Tests: Recent Labs  Lab 05/17/19 0203 05/18/19 0231  ALBUMIN 3.9 3.5   No results for input(s): LIPASE, AMYLASE in the last 168 hours. No results for input(s): AMMONIA in the last 168 hours. CBC: Recent Labs  Lab  05/16/19 1211 05/17/19 0203 05/17/19 1730 05/18/19 0231  WBC 3.2* 3.7* 3.8* 3.0*  NEUTROABS  --   --  3.0  --   HGB 10.0* 9.4* 9.6* 8.9*  HCT 30.7* 27.3* 27.4* 25.9*  MCV 111.6* 106.6* 108.3* 106.6*  PLT 149* 138* 144* 140*   Cardiac Enzymes: No results for input(s): CKTOTAL, CKMB, CKMBINDEX, TROPONINI in the last 168 hours. CBG: No results for input(s): GLUCAP in the last 168 hours.  Studies/Results: No results found. Medications:  . acetaminophen  650 mg Oral Q6H  . amLODipine  10 mg Oral Daily  . aspirin EC  81 mg Oral Daily  . carvedilol  12.5 mg Oral BID WC  . Chlorhexidine Gluconate Cloth  6 each Topical Q0600  . cinacalcet  30 mg Oral Q supper  . feeding supplement (PRO-STAT SUGAR FREE 64)  30 mL Oral BID  . gabapentin  300 mg Oral QHS  . heparin  5,000 Units Subcutaneous Q8H  . hydrALAZINE  100 mg Oral  Q8H  . lidocaine  1 patch Transdermal Q24H  . lidocaine  1 patch Transdermal Daily  . mometasone-formoterol  2 puff Inhalation BID  . multivitamin  1 tablet Oral QHS  . pantoprazole  40 mg Oral Daily  . ramelteon  8 mg Oral QHS  . rOPINIRole  0.5 mg Oral BID  . sevelamer carbonate  1,600 mg Oral TID WC  . sodium chloride flush  3 mL Intravenous Once

## 2019-05-18 NOTE — Progress Notes (Signed)
Palliative referral for goals of care discussion. Unfortunately PMT unable to see patient prior to discharge. Patient has returned from HD and is apparently leaving AMA despite awareness of attending preparing to discharge him.   Alda Lea, AGPCNP-BC Palliative Medicine Team   NO CHARGE

## 2019-05-19 DIAGNOSIS — I10 Essential (primary) hypertension: Secondary | ICD-10-CM | POA: Diagnosis not present

## 2019-05-19 DIAGNOSIS — N186 End stage renal disease: Secondary | ICD-10-CM | POA: Diagnosis not present

## 2019-05-19 DIAGNOSIS — Z992 Dependence on renal dialysis: Secondary | ICD-10-CM | POA: Diagnosis not present

## 2019-05-21 DIAGNOSIS — E876 Hypokalemia: Secondary | ICD-10-CM | POA: Diagnosis not present

## 2019-05-21 DIAGNOSIS — N186 End stage renal disease: Secondary | ICD-10-CM | POA: Diagnosis not present

## 2019-05-21 DIAGNOSIS — N2581 Secondary hyperparathyroidism of renal origin: Secondary | ICD-10-CM | POA: Diagnosis not present

## 2019-05-21 DIAGNOSIS — Z992 Dependence on renal dialysis: Secondary | ICD-10-CM | POA: Diagnosis not present

## 2019-05-21 DIAGNOSIS — D509 Iron deficiency anemia, unspecified: Secondary | ICD-10-CM | POA: Diagnosis not present

## 2019-05-21 DIAGNOSIS — D631 Anemia in chronic kidney disease: Secondary | ICD-10-CM | POA: Diagnosis not present

## 2019-05-24 DIAGNOSIS — N186 End stage renal disease: Secondary | ICD-10-CM | POA: Diagnosis not present

## 2019-05-24 DIAGNOSIS — E876 Hypokalemia: Secondary | ICD-10-CM | POA: Diagnosis not present

## 2019-05-24 DIAGNOSIS — D631 Anemia in chronic kidney disease: Secondary | ICD-10-CM | POA: Diagnosis not present

## 2019-05-24 DIAGNOSIS — Z992 Dependence on renal dialysis: Secondary | ICD-10-CM | POA: Diagnosis not present

## 2019-05-24 DIAGNOSIS — D509 Iron deficiency anemia, unspecified: Secondary | ICD-10-CM | POA: Diagnosis not present

## 2019-05-24 DIAGNOSIS — N2581 Secondary hyperparathyroidism of renal origin: Secondary | ICD-10-CM | POA: Diagnosis not present

## 2019-05-25 DIAGNOSIS — H2511 Age-related nuclear cataract, right eye: Secondary | ICD-10-CM | POA: Diagnosis not present

## 2019-05-26 DIAGNOSIS — N186 End stage renal disease: Secondary | ICD-10-CM | POA: Diagnosis not present

## 2019-05-26 DIAGNOSIS — Z992 Dependence on renal dialysis: Secondary | ICD-10-CM | POA: Diagnosis not present

## 2019-05-26 DIAGNOSIS — D509 Iron deficiency anemia, unspecified: Secondary | ICD-10-CM | POA: Diagnosis not present

## 2019-05-26 DIAGNOSIS — E876 Hypokalemia: Secondary | ICD-10-CM | POA: Diagnosis not present

## 2019-05-26 DIAGNOSIS — N2581 Secondary hyperparathyroidism of renal origin: Secondary | ICD-10-CM | POA: Diagnosis not present

## 2019-05-26 DIAGNOSIS — D631 Anemia in chronic kidney disease: Secondary | ICD-10-CM | POA: Diagnosis not present

## 2019-05-30 DIAGNOSIS — N2581 Secondary hyperparathyroidism of renal origin: Secondary | ICD-10-CM | POA: Diagnosis not present

## 2019-05-30 DIAGNOSIS — Z992 Dependence on renal dialysis: Secondary | ICD-10-CM | POA: Diagnosis not present

## 2019-05-30 DIAGNOSIS — D631 Anemia in chronic kidney disease: Secondary | ICD-10-CM | POA: Diagnosis not present

## 2019-05-30 DIAGNOSIS — N186 End stage renal disease: Secondary | ICD-10-CM | POA: Diagnosis not present

## 2019-05-30 DIAGNOSIS — D509 Iron deficiency anemia, unspecified: Secondary | ICD-10-CM | POA: Diagnosis not present

## 2019-05-30 DIAGNOSIS — E876 Hypokalemia: Secondary | ICD-10-CM | POA: Diagnosis not present

## 2019-06-02 DIAGNOSIS — Z23 Encounter for immunization: Secondary | ICD-10-CM | POA: Diagnosis not present

## 2019-06-02 DIAGNOSIS — N2581 Secondary hyperparathyroidism of renal origin: Secondary | ICD-10-CM | POA: Diagnosis not present

## 2019-06-02 DIAGNOSIS — S2239XA Fracture of one rib, unspecified side, initial encounter for closed fracture: Secondary | ICD-10-CM

## 2019-06-02 DIAGNOSIS — S2241XA Multiple fractures of ribs, right side, initial encounter for closed fracture: Secondary | ICD-10-CM | POA: Diagnosis not present

## 2019-06-02 DIAGNOSIS — E876 Hypokalemia: Secondary | ICD-10-CM | POA: Diagnosis not present

## 2019-06-02 DIAGNOSIS — I10 Essential (primary) hypertension: Secondary | ICD-10-CM | POA: Diagnosis not present

## 2019-06-02 DIAGNOSIS — Z992 Dependence on renal dialysis: Secondary | ICD-10-CM | POA: Diagnosis not present

## 2019-06-02 DIAGNOSIS — S2249XA Multiple fractures of ribs, unspecified side, initial encounter for closed fracture: Secondary | ICD-10-CM

## 2019-06-02 DIAGNOSIS — N186 End stage renal disease: Secondary | ICD-10-CM | POA: Diagnosis not present

## 2019-06-02 DIAGNOSIS — D509 Iron deficiency anemia, unspecified: Secondary | ICD-10-CM | POA: Diagnosis not present

## 2019-06-02 DIAGNOSIS — D631 Anemia in chronic kidney disease: Secondary | ICD-10-CM | POA: Diagnosis not present

## 2019-06-02 HISTORY — DX: Fracture of one rib, unspecified side, initial encounter for closed fracture: S22.39XA

## 2019-06-02 HISTORY — PX: EYE SURGERY: SHX253

## 2019-06-02 HISTORY — DX: Multiple fractures of ribs, unspecified side, initial encounter for closed fracture: S22.49XA

## 2019-06-04 ENCOUNTER — Encounter (HOSPITAL_COMMUNITY): Payer: Self-pay | Admitting: *Deleted

## 2019-06-04 ENCOUNTER — Inpatient Hospital Stay (HOSPITAL_COMMUNITY)
Admission: EM | Admit: 2019-06-04 | Discharge: 2019-06-07 | DRG: 205 | Discharge status: Against Medical Advice | Payer: Medicare Other | Attending: Internal Medicine | Admitting: Internal Medicine

## 2019-06-04 ENCOUNTER — Emergency Department (HOSPITAL_COMMUNITY): Payer: Medicare Other

## 2019-06-04 ENCOUNTER — Other Ambulatory Visit: Payer: Self-pay

## 2019-06-04 DIAGNOSIS — I7121 Aneurysm of the ascending aorta, without rupture: Secondary | ICD-10-CM | POA: Diagnosis present

## 2019-06-04 DIAGNOSIS — D631 Anemia in chronic kidney disease: Secondary | ICD-10-CM | POA: Diagnosis present

## 2019-06-04 DIAGNOSIS — I10 Essential (primary) hypertension: Secondary | ICD-10-CM | POA: Diagnosis not present

## 2019-06-04 DIAGNOSIS — I5032 Chronic diastolic (congestive) heart failure: Secondary | ICD-10-CM | POA: Diagnosis present

## 2019-06-04 DIAGNOSIS — Z79899 Other long term (current) drug therapy: Secondary | ICD-10-CM

## 2019-06-04 DIAGNOSIS — K219 Gastro-esophageal reflux disease without esophagitis: Secondary | ICD-10-CM | POA: Diagnosis present

## 2019-06-04 DIAGNOSIS — N2581 Secondary hyperparathyroidism of renal origin: Secondary | ICD-10-CM | POA: Diagnosis not present

## 2019-06-04 DIAGNOSIS — S2239XA Fracture of one rib, unspecified side, initial encounter for closed fracture: Secondary | ICD-10-CM | POA: Diagnosis present

## 2019-06-04 DIAGNOSIS — R109 Unspecified abdominal pain: Secondary | ICD-10-CM | POA: Diagnosis not present

## 2019-06-04 DIAGNOSIS — Z20828 Contact with and (suspected) exposure to other viral communicable diseases: Secondary | ICD-10-CM | POA: Diagnosis not present

## 2019-06-04 DIAGNOSIS — Y92012 Bathroom of single-family (private) house as the place of occurrence of the external cause: Secondary | ICD-10-CM

## 2019-06-04 DIAGNOSIS — I132 Hypertensive heart and chronic kidney disease with heart failure and with stage 5 chronic kidney disease, or end stage renal disease: Secondary | ICD-10-CM | POA: Diagnosis present

## 2019-06-04 DIAGNOSIS — Y92009 Unspecified place in unspecified non-institutional (private) residence as the place of occurrence of the external cause: Secondary | ICD-10-CM | POA: Diagnosis not present

## 2019-06-04 DIAGNOSIS — Z992 Dependence on renal dialysis: Secondary | ICD-10-CM

## 2019-06-04 DIAGNOSIS — E8889 Other specified metabolic disorders: Secondary | ICD-10-CM | POA: Diagnosis present

## 2019-06-04 DIAGNOSIS — S3991XA Unspecified injury of abdomen, initial encounter: Secondary | ICD-10-CM | POA: Diagnosis not present

## 2019-06-04 DIAGNOSIS — J9601 Acute respiratory failure with hypoxia: Secondary | ICD-10-CM | POA: Diagnosis present

## 2019-06-04 DIAGNOSIS — S27329A Contusion of lung, unspecified, initial encounter: Secondary | ICD-10-CM

## 2019-06-04 DIAGNOSIS — R0902 Hypoxemia: Secondary | ICD-10-CM

## 2019-06-04 DIAGNOSIS — W010XXA Fall on same level from slipping, tripping and stumbling without subsequent striking against object, initial encounter: Secondary | ICD-10-CM | POA: Diagnosis present

## 2019-06-04 DIAGNOSIS — E876 Hypokalemia: Secondary | ICD-10-CM | POA: Diagnosis not present

## 2019-06-04 DIAGNOSIS — Z8673 Personal history of transient ischemic attack (TIA), and cerebral infarction without residual deficits: Secondary | ICD-10-CM

## 2019-06-04 DIAGNOSIS — S2241XA Multiple fractures of ribs, right side, initial encounter for closed fracture: Secondary | ICD-10-CM | POA: Diagnosis not present

## 2019-06-04 DIAGNOSIS — S27321A Contusion of lung, unilateral, initial encounter: Secondary | ICD-10-CM | POA: Diagnosis not present

## 2019-06-04 DIAGNOSIS — W19XXXA Unspecified fall, initial encounter: Secondary | ICD-10-CM | POA: Diagnosis present

## 2019-06-04 DIAGNOSIS — S2231XA Fracture of one rib, right side, initial encounter for closed fracture: Secondary | ICD-10-CM | POA: Diagnosis present

## 2019-06-04 DIAGNOSIS — R079 Chest pain, unspecified: Secondary | ICD-10-CM | POA: Diagnosis not present

## 2019-06-04 DIAGNOSIS — T148XXA Other injury of unspecified body region, initial encounter: Secondary | ICD-10-CM

## 2019-06-04 DIAGNOSIS — D509 Iron deficiency anemia, unspecified: Secondary | ICD-10-CM | POA: Diagnosis not present

## 2019-06-04 DIAGNOSIS — Z7982 Long term (current) use of aspirin: Secondary | ICD-10-CM

## 2019-06-04 DIAGNOSIS — G2581 Restless legs syndrome: Secondary | ICD-10-CM | POA: Diagnosis present

## 2019-06-04 DIAGNOSIS — S2249XA Multiple fractures of ribs, unspecified side, initial encounter for closed fracture: Secondary | ICD-10-CM | POA: Diagnosis present

## 2019-06-04 DIAGNOSIS — Z9115 Patient's noncompliance with renal dialysis: Secondary | ICD-10-CM

## 2019-06-04 DIAGNOSIS — N186 End stage renal disease: Secondary | ICD-10-CM

## 2019-06-04 DIAGNOSIS — Y9301 Activity, walking, marching and hiking: Secondary | ICD-10-CM | POA: Diagnosis present

## 2019-06-04 DIAGNOSIS — I712 Thoracic aortic aneurysm, without rupture: Secondary | ICD-10-CM | POA: Diagnosis present

## 2019-06-04 DIAGNOSIS — Z7951 Long term (current) use of inhaled steroids: Secondary | ICD-10-CM

## 2019-06-04 DIAGNOSIS — E785 Hyperlipidemia, unspecified: Secondary | ICD-10-CM | POA: Diagnosis present

## 2019-06-04 DIAGNOSIS — J45909 Unspecified asthma, uncomplicated: Secondary | ICD-10-CM | POA: Diagnosis present

## 2019-06-04 HISTORY — DX: Contusion of lung, unspecified, initial encounter: S27.329A

## 2019-06-04 HISTORY — DX: Acute respiratory failure with hypoxia: J96.01

## 2019-06-04 HISTORY — DX: Fracture of one rib, right side, initial encounter for closed fracture: S22.31XA

## 2019-06-04 HISTORY — DX: Unspecified fall, initial encounter: W19.XXXA

## 2019-06-04 HISTORY — DX: Gastro-esophageal reflux disease without esophagitis: K21.9

## 2019-06-04 HISTORY — DX: Chronic diastolic (congestive) heart failure: I50.32

## 2019-06-04 LAB — CBC WITH DIFFERENTIAL/PLATELET
Abs Immature Granulocytes: 0.02 10*3/uL (ref 0.00–0.07)
Basophils Absolute: 0 10*3/uL (ref 0.0–0.1)
Basophils Relative: 1 %
Eosinophils Absolute: 0.2 10*3/uL (ref 0.0–0.5)
Eosinophils Relative: 4 %
HCT: 29.5 % — ABNORMAL LOW (ref 39.0–52.0)
Hemoglobin: 9.6 g/dL — ABNORMAL LOW (ref 13.0–17.0)
Immature Granulocytes: 0 %
Lymphocytes Relative: 7 %
Lymphs Abs: 0.4 10*3/uL — ABNORMAL LOW (ref 0.7–4.0)
MCH: 36.5 pg — ABNORMAL HIGH (ref 26.0–34.0)
MCHC: 32.5 g/dL (ref 30.0–36.0)
MCV: 112.2 fL — ABNORMAL HIGH (ref 80.0–100.0)
Monocytes Absolute: 0.4 10*3/uL (ref 0.1–1.0)
Monocytes Relative: 7 %
Neutro Abs: 4.5 10*3/uL (ref 1.7–7.7)
Neutrophils Relative %: 81 %
Platelets: 149 10*3/uL — ABNORMAL LOW (ref 150–400)
RBC: 2.63 MIL/uL — ABNORMAL LOW (ref 4.22–5.81)
RDW: 15.3 % (ref 11.5–15.5)
WBC: 5.6 10*3/uL (ref 4.0–10.5)
nRBC: 0 % (ref 0.0–0.2)

## 2019-06-04 LAB — BASIC METABOLIC PANEL
Anion gap: 16 — ABNORMAL HIGH (ref 5–15)
BUN: 27 mg/dL — ABNORMAL HIGH (ref 6–20)
CO2: 25 mmol/L (ref 22–32)
Calcium: 8.7 mg/dL — ABNORMAL LOW (ref 8.9–10.3)
Chloride: 96 mmol/L — ABNORMAL LOW (ref 98–111)
Creatinine, Ser: 5.78 mg/dL — ABNORMAL HIGH (ref 0.61–1.24)
GFR calc Af Amer: 11 mL/min — ABNORMAL LOW (ref >60)
GFR calc non Af Amer: 10 mL/min — ABNORMAL LOW (ref >60)
Glucose, Bld: 104 mg/dL — ABNORMAL HIGH (ref 70–99)
Potassium: 5 mmol/L (ref 3.5–5.1)
Sodium: 137 mmol/L (ref 135–145)

## 2019-06-04 LAB — SARS CORONAVIRUS 2 BY RT PCR (HOSPITAL ORDER, PERFORMED IN ~~LOC~~ HOSPITAL LAB): SARS Coronavirus 2: NEGATIVE

## 2019-06-04 MED ORDER — GABAPENTIN 300 MG PO CAPS
300.0000 mg | ORAL_CAPSULE | Freq: Every day | ORAL | Status: DC
  Administered 2019-06-05 – 2019-06-06 (×3): 300 mg via ORAL
  Filled 2019-06-04 (×3): qty 1

## 2019-06-04 MED ORDER — MOMETASONE FURO-FORMOTEROL FUM 100-5 MCG/ACT IN AERO
2.0000 | INHALATION_SPRAY | Freq: Two times a day (BID) | RESPIRATORY_TRACT | Status: DC
  Administered 2019-06-05 – 2019-06-06 (×4): 2 via RESPIRATORY_TRACT
  Filled 2019-06-04: qty 8.8

## 2019-06-04 MED ORDER — CINACALCET HCL 30 MG PO TABS
30.0000 mg | ORAL_TABLET | Freq: Every day | ORAL | Status: DC
  Administered 2019-06-05 – 2019-06-06 (×2): 30 mg via ORAL
  Filled 2019-06-04 (×2): qty 1

## 2019-06-04 MED ORDER — MORPHINE SULFATE (PF) 2 MG/ML IV SOLN
2.0000 mg | INTRAVENOUS | Status: DC | PRN
  Administered 2019-06-05: 2 mg via INTRAVENOUS
  Filled 2019-06-04: qty 1

## 2019-06-04 MED ORDER — MORPHINE SULFATE (PF) 2 MG/ML IV SOLN: 2.0000 mg | INTRAVENOUS | Status: DC | PRN

## 2019-06-04 MED ORDER — ASPIRIN 81 MG PO TBEC
81.0000 mg | DELAYED_RELEASE_TABLET | Freq: Every day | ORAL | Status: DC
  Filled 2019-06-04: qty 1

## 2019-06-04 MED ORDER — ONDANSETRON 4 MG PO TBDP
4.0000 mg | ORAL_TABLET | Freq: Once | ORAL | Status: AC
  Administered 2019-06-04: 17:00:00 4 mg via ORAL
  Filled 2019-06-04: qty 1

## 2019-06-04 MED ORDER — CARVEDILOL 12.5 MG PO TABS
12.5000 mg | ORAL_TABLET | Freq: Two times a day (BID) | ORAL | Status: DC
  Administered 2019-06-05 – 2019-06-06 (×4): 12.5 mg via ORAL
  Filled 2019-06-04 (×5): qty 1

## 2019-06-04 MED ORDER — RENA-VITE PO TABS
1.0000 | ORAL_TABLET | Freq: Every day | ORAL | Status: DC
  Administered 2019-06-05 – 2019-06-06 (×2): 1 via ORAL
  Filled 2019-06-04 (×3): qty 1

## 2019-06-04 MED ORDER — ROPINIROLE HCL 0.5 MG PO TABS
0.5000 mg | ORAL_TABLET | Freq: Two times a day (BID) | ORAL | Status: DC
  Administered 2019-06-05 – 2019-06-06 (×4): 0.5 mg via ORAL
  Filled 2019-06-04 (×6): qty 1

## 2019-06-04 MED ORDER — DM-GUAIFENESIN ER 30-600 MG PO TB12: 1.0000 | ORAL_TABLET | Freq: Two times a day (BID) | ORAL | Status: DC | PRN

## 2019-06-04 MED ORDER — AMLODIPINE BESYLATE 10 MG PO TABS
10.0000 mg | ORAL_TABLET | Freq: Every day | ORAL | Status: DC
  Administered 2019-06-05 – 2019-06-06 (×2): 10 mg via ORAL
  Filled 2019-06-04 (×3): qty 1

## 2019-06-04 MED ORDER — LIDOCAINE 5 % EX PTCH
1.0000 | MEDICATED_PATCH | CUTANEOUS | Status: DC
  Administered 2019-06-04 – 2019-06-06 (×3): 1 via TRANSDERMAL
  Filled 2019-06-04 (×3): qty 1

## 2019-06-04 MED ORDER — HYDRALAZINE HCL 25 MG PO TABS: 25.0000 mg | ORAL_TABLET | Freq: Three times a day (TID) | ORAL | Status: DC | PRN

## 2019-06-04 MED ORDER — HYDROMORPHONE HCL 2 MG/ML IJ SOLN
2.0000 mg | Freq: Once | INTRAMUSCULAR | Status: AC
  Administered 2019-06-04: 2 mg via INTRAMUSCULAR
  Filled 2019-06-04: qty 1

## 2019-06-04 MED ORDER — PANTOPRAZOLE SODIUM 40 MG PO TBEC
40.0000 mg | DELAYED_RELEASE_TABLET | Freq: Every day | ORAL | Status: DC
  Administered 2019-06-05 – 2019-06-06 (×2): 40 mg via ORAL
  Filled 2019-06-04 (×3): qty 1

## 2019-06-04 MED ORDER — ACETAMINOPHEN 650 MG RE SUPP: 650.0000 mg | Freq: Four times a day (QID) | RECTAL | Status: DC | PRN

## 2019-06-04 MED ORDER — ALBUTEROL SULFATE HFA 108 (90 BASE) MCG/ACT IN AERS
2.0000 | INHALATION_SPRAY | RESPIRATORY_TRACT | Status: DC | PRN
  Filled 2019-06-04: qty 6.7

## 2019-06-04 MED ORDER — SEVELAMER CARBONATE 800 MG PO TABS
800.0000 mg | ORAL_TABLET | Freq: Three times a day (TID) | ORAL | Status: DC
  Administered 2019-06-05 – 2019-06-06 (×4): 800 mg via ORAL
  Filled 2019-06-04 (×4): qty 1

## 2019-06-04 MED ORDER — OXYCODONE-ACETAMINOPHEN 5-325 MG PO TABS
1.0000 | ORAL_TABLET | ORAL | Status: DC | PRN
  Administered 2019-06-05 – 2019-06-06 (×4): 1 via ORAL
  Filled 2019-06-04 (×4): qty 1

## 2019-06-04 MED ORDER — ACETAMINOPHEN 325 MG PO TABS: 650.0000 mg | ORAL_TABLET | Freq: Four times a day (QID) | ORAL | Status: DC | PRN

## 2019-06-04 MED ORDER — HYDRALAZINE HCL 25 MG PO TABS
100.0000 mg | ORAL_TABLET | Freq: Three times a day (TID) | ORAL | Status: DC
  Administered 2019-06-05 – 2019-06-06 (×6): 100 mg via ORAL
  Filled 2019-06-04 (×7): qty 4

## 2019-06-04 NOTE — Consult Note (Signed)
Activation and Reason: consult, fall from standing height  Primary Survey: airway intact, breath sounds present bilaterally, pulses intact  Thomas Robinson is an 58 y.o. male.  HPI: 58 yo male tripped over his cat, hit his right side on his desk as he fell into his bed. He denies loss of consciousness. He only hurts at his right side. He does not take blood thinners. He does not smoke. He does not think he has known lung disease and is not on home oxygen.  Past Medical History:  Diagnosis Date   Acute kidney injury superimposed on CKD ([place]) 05/24/2017   Acute on chronic diastolic CHF (congestive heart failure) ([place]) 04/13/2018   AKI (acute kidney injury) ([place]) 04/10/2015   Asthma    Asthma, chronic, unspecified asthma severity, with acute exacerbation 10/23/2017   Atypical chest pain 02/16/2018   CAP (community acquired pneumonia) 10/23/2017   Cerebral infarction due to thrombosis of cerebral artery (HCC)    Cerebrovascular accident (CVA) ([place])    Chronic kidney disease    CKD (chronic kidney disease), stage V ([place]) 05/24/2017   ESRD (end stage renal disease) on dialysis ([place]) 02/16/2018   Essential hypertension 04/09/2015   GI bleeding 05/24/2017   History of completed stroke    Hyperlipidemia    Hypertension    Hypertensive urgency 05/24/2017   Kidney failure    Respiratory failure, acute ([place]) 10/23/2017   Restless leg syndrome    Stroke ([place])    Symptomatic anemia 05/24/2017    Past Surgical History:  Procedure Laterality Date   AV FISTULA PLACEMENT Left 05/28/2017   Procedure: LEFT ARM ARTERIOVENOUS (AV) FISTULA CREATION;  Surgeon: Conrad Bradenville, MD;  Location: [facility] OR;  Service: Vascular;  Laterality: Left;   IR FLUORO GUIDE CV LINE RIGHT  05/25/2017   IR US GUIDE VASC ACCESS RIGHT  05/25/2017   RINOPLASTY      Family History  Problem Relation Age of Onset   Cancer Mother     Social History:  reports that he has never smoked. He has never used  smokeless tobacco. He reports that he does not drink alcohol or use drugs.  Allergies: No Known Allergies  Medications: I have reviewed the patient's current medications.  Results for orders placed or performed during the hospital encounter of 06/04/19 (from the past 48 hour(s))  SARS Coronavirus 2 [facility] order, Performed in [facility] hospital lab) Nasopharyngeal Nasopharyngeal Swab     Status: None   Collection Time: 06/04/19  5:58 PM   Specimen: Nasopharyngeal Swab  Result Value Ref Range   SARS Coronavirus 2 NEGATIVE NEGATIVE    Comment: (NOTE) If result is NEGATIVE SARS-CoV-2 target nucleic acids are NOT DETECTED. The SARS-CoV-2 RNA is generally detectable in upper and lower  respiratory specimens during the acute phase of infection. The lowest  concentration of SARS-CoV-2 viral copies this assay can detect is 250  copies / mL. A negative result does not preclude SARS-CoV-2 infection  and should not be used as the sole basis for treatment or other  patient management decisions.  A negative result may occur with  improper specimen collection / handling, submission of specimen other  than nasopharyngeal swab, presence of viral mutation(s) within the  areas targeted by this assay, and inadequate number of viral copies  (<250 copies / mL). A negative result must be combined with clinical  observations, patient history, and epidemiological information. If result is POSITIVE SARS-CoV-2 target nucleic acids are DETECTED. The SARS-CoV-2 RNA is  generally detectable in upper and lower  respiratory specimens dur ing the acute phase of infection.  Positive  results are indicative of active infection with SARS-CoV-2.  Clinical  correlation with patient history and other diagnostic information is  necessary to determine patient infection status.  Positive results do  not rule out bacterial infection or co-infection with other viruses. If result is PRESUMPTIVE POSTIVE SARS-CoV-2 nucleic  acids MAY BE PRESENT.   A presumptive positive result was obtained on the submitted specimen  and confirmed on repeat testing.  While 2019 novel coronavirus  (SARS-CoV-2) nucleic acids may be present in the submitted sample  additional confirmatory testing may be necessary for epidemiological  and / or clinical management purposes  to differentiate between  SARS-CoV-2 and other Sarbecovirus currently known to infect humans.  If clinically indicated additional testing with an alternate test  methodology [phone]) is advised. The SARS-CoV-2 RNA is generally  detectable in upper and lower respiratory sp ecimens during the acute  phase of infection. The expected result is Negative. Fact Sheet for Patients:  StrictlyIdeas.no Fact Sheet for Healthcare Providers: BankingDealers.co.za This test is not yet approved or cleared by the [place] FDA and has been authorized for detection and/or diagnosis of SARS-CoV-2 by FDA under an Emergency Use Authorization (EUA).  This EUA will remain in effect (meaning this test can be used) for the duration of the COVID-19 declaration under Section 564(b)(1) of the Act, 21 U.S.C. section 360bbb-3(b)(1), unless the authorization is terminated or revoked sooner. Performed at [facility], [place] [address], [place], [place] [address]   CBC with Differential/Platelet     Status: Abnormal   Collection Time: 06/04/19  6:36 PM  Result Value Ref Range   WBC 5.6 4.0 - 10.5 K/uL   RBC 2.63 (L) 4.22 - 5.81 MIL/uL   Hemoglobin 9.6 (L) 13.0 - 17.0 g/dL   HCT 29.5 (L) 39.0 - 52.0 %   MCV 112.2 (H) 80.0 - 100.0 fL   MCH 36.5 (H) 26.0 - 34.0 pg   MCHC 32.5 30.0 - 36.0 g/dL   RDW 15.3 11.5 - 15.5 %   Platelets 149 (L) 150 - 400 K/uL   nRBC 0.0 0.0 - 0.2 %   Neutrophils Relative % 81 %   Neutro Abs 4.5 1.7 - 7.7 K/uL   Lymphocytes Relative 7 %   Lymphs Abs 0.4 (L) 0.7 - 4.0 K/uL   Monocytes  Relative 7 %   Monocytes Absolute 0.4 0.1 - 1.0 K/uL   Eosinophils Relative 4 %   Eosinophils Absolute 0.2 0.0 - 0.5 K/uL   Basophils Relative 1 %   Basophils Absolute 0.0 0.0 - 0.1 K/uL   Immature Granulocytes 0 %   Abs Immature Granulocytes 0.02 0.00 - 0.07 K/uL    Comment: Performed at [facility], [place] [address], [place], [place] [address]    Ct Abdomen Pelvis Wo Contrast  Result Date: 06/04/2019 CLINICAL DATA:  Trip and fall with right sided injury against the corner of [place]. Right sided pain. Back pain. EXAM: CT CHEST, ABDOMEN AND PELVIS WITHOUT CONTRAST TECHNIQUE: Multidetector CT imaging of the chest, abdomen and pelvis was performed following the standard protocol without IV contrast. COMPARISON:  Chest radiograph from earlier today. FINDINGS: CT CHEST FINDINGS Cardiovascular: Mild cardiomegaly. No significant pericardial effusion/thickening. Left anterior descending and left circumflex coronary atherosclerosis. Atherosclerotic thoracic aorta with 4.5 cm ascending thoracic aortic aneurysm. No evidence of acute intramural hematoma in the thoracic aorta. Top-normal  caliber main pulmonary artery (3.2 cm diameter). Mediastinum/Nodes: Hypodense 1.0 cm posterior left thyroid lobe nodule. Unremarkable esophagus. No pathologically enlarged axillary, mediastinal or hilar lymph nodes, noting limited sensitivity for the detection of hilar adenopathy on this noncontrast study. Lungs/Pleura: No pneumothorax. Minimal smooth with basilar left pleural thickening with trace dependent left pleural effusion. No right pleural effusion. Diffuse bronchial wall thickening. Patchy consolidation throughout peripheral basilar right lower lobe. Scattered parenchymal bands in basilar left lower lobe. Mosaic attenuation in both lungs. Mild diffuse interlobular septal thickening. No lung masses or significant pulmonary nodules. No central airway stenoses. Musculoskeletal: No aggressive appearing  focal osseous lesions. Nondisplaced acute posterolateral right eighth and posterior right ninth rib fractures. Moderate thoracic spondylosis. CT ABDOMEN PELVIS FINDINGS Hepatobiliary: Normal size liver. No liver mass. No appreciable liver laceration on this noncontrast scan. Normal gallbladder with no radiopaque cholelithiasis. No biliary ductal dilatation. Pancreas: Normal, with no mass or duct dilation. Spleen: Normal size. No mass. Adrenals/Urinary Tract: Normal adrenals. Symmetric severe bilateral renal cortical atrophy. No renal stones. No hydronephrosis. Scattered subcentimeter hypodense right renal cortical lesions are too small to characterize and require no follow-up. No contour deforming left renal lesions. Normal bladder. Stomach/Bowel: Normal non-distended stomach. Normal caliber small bowel with no small bowel wall thickening. Normal appendix. Normal large bowel with no diverticulosis, large bowel wall thickening or pericolonic fat stranding. Vascular/Lymphatic: Atherosclerotic nonaneurysmal abdominal aorta. No pathologically enlarged lymph nodes in the abdomen or pelvis. Reproductive: Top-normal size prostate. Other: No pneumoperitoneum, ascites or focal fluid collection. Small fat containing umbilical hernia. Musculoskeletal: No aggressive appearing focal osseous lesions. No fracture in the abdomen or pelvis. Moderate lumbar spondylosis. IMPRESSION: 1. Acute posterior right eighth and ninth rib fractures. No pneumothorax. 2. Patchy consolidation in the peripheral right lower lobe, compatible with pulmonary contusion. 3. Mild cardiomegaly.  Two-vessel coronary atherosclerosis. 4. Mild diffuse interlobular septal thickening, suggesting mild pulmonary edema. 5. Mosaic attenuation throughout both lungs, a nonspecific finding that can be due to air trapping from small airways disease or mosaic perfusion from pulmonary vascular disease. 6. Ascending thoracic aortic 4.5 cm aneurysm. Ascending thoracic aortic  aneurysm. Recommend semi-annual imaging followup by CTA or MRA and referral to cardiothoracic surgery if not already obtained. This recommendation follows 2010 ACCF/AHA/AATS/ACR/ASA/SCA/SCAI/SIR/STS/SVM Guidelines for the Diagnosis and Management of Patients With Thoracic Aortic Disease. Circulation. 2010; 121: X735-H299. Aortic aneurysm NOS (ICD10-I71.9). 7. No evidence of acute traumatic injury in the abdomen or pelvis on this noncontrast scan. Electronically Signed   By: Ilona Sorrel M.D.   On: 06/04/2019 17:25   Dg Chest 2 View  Result Date: 06/04/2019 CLINICAL DATA:  Trip and fall, right-sided rib pain EXAM: CHEST - 2 VIEW COMPARISON:  05/16/2019 FINDINGS: Cardiomegaly. Pulmonary vascular prominence without overt edema. There is a minimally displaced fracture of the posterior right sixth rib. IMPRESSION: 1. There is a minimally displaced fracture of the posterior right sixth rib. No pneumothorax. 2. Cardiomegaly and pulmonary vascular prominence without overt pulmonary edema. No focal airspace opacity. Electronically Signed   By: Eddie Candle M.D.   On: 06/04/2019 16:07   Ct Chest Wo Contrast  Result Date: 06/04/2019 CLINICAL DATA:  Trip and fall with right sided injury against the corner of [place]. Right sided pain. Back pain. EXAM: CT CHEST, ABDOMEN AND PELVIS WITHOUT CONTRAST TECHNIQUE: Multidetector CT imaging of the chest, abdomen and pelvis was performed following the standard protocol without IV contrast. COMPARISON:  Chest radiograph from earlier today. FINDINGS: CT CHEST FINDINGS Cardiovascular: Mild cardiomegaly. No  significant pericardial effusion/thickening. Left anterior descending and left circumflex coronary atherosclerosis. Atherosclerotic thoracic aorta with 4.5 cm ascending thoracic aortic aneurysm. No evidence of acute intramural hematoma in the thoracic aorta. Top-normal caliber main pulmonary artery (3.2 cm diameter). Mediastinum/Nodes: Hypodense 1.0 cm posterior left thyroid  lobe nodule. Unremarkable esophagus. No pathologically enlarged axillary, mediastinal or hilar lymph nodes, noting limited sensitivity for the detection of hilar adenopathy on this noncontrast study. Lungs/Pleura: No pneumothorax. Minimal smooth with basilar left pleural thickening with trace dependent left pleural effusion. No right pleural effusion. Diffuse bronchial wall thickening. Patchy consolidation throughout peripheral basilar right lower lobe. Scattered parenchymal bands in basilar left lower lobe. Mosaic attenuation in both lungs. Mild diffuse interlobular septal thickening. No lung masses or significant pulmonary nodules. No central airway stenoses. Musculoskeletal: No aggressive appearing focal osseous lesions. Nondisplaced acute posterolateral right eighth and posterior right ninth rib fractures. Moderate thoracic spondylosis. CT ABDOMEN PELVIS FINDINGS Hepatobiliary: Normal size liver. No liver mass. No appreciable liver laceration on this noncontrast scan. Normal gallbladder with no radiopaque cholelithiasis. No biliary ductal dilatation. Pancreas: Normal, with no mass or duct dilation. Spleen: Normal size. No mass. Adrenals/Urinary Tract: Normal adrenals. Symmetric severe bilateral renal cortical atrophy. No renal stones. No hydronephrosis. Scattered subcentimeter hypodense right renal cortical lesions are too small to characterize and require no follow-up. No contour deforming left renal lesions. Normal bladder. Stomach/Bowel: Normal non-distended stomach. Normal caliber small bowel with no small bowel wall thickening. Normal appendix. Normal large bowel with no diverticulosis, large bowel wall thickening or pericolonic fat stranding. Vascular/Lymphatic: Atherosclerotic nonaneurysmal abdominal aorta. No pathologically enlarged lymph nodes in the abdomen or pelvis. Reproductive: Top-normal size prostate. Other: No pneumoperitoneum, ascites or focal fluid collection. Small fat containing umbilical  hernia. Musculoskeletal: No aggressive appearing focal osseous lesions. No fracture in the abdomen or pelvis. Moderate lumbar spondylosis. IMPRESSION: 1. Acute posterior right eighth and ninth rib fractures. No pneumothorax. 2. Patchy consolidation in the peripheral right lower lobe, compatible with pulmonary contusion. 3. Mild cardiomegaly.  Two-vessel coronary atherosclerosis. 4. Mild diffuse interlobular septal thickening, suggesting mild pulmonary edema. 5. Mosaic attenuation throughout both lungs, a nonspecific finding that can be due to air trapping from small airways disease or mosaic perfusion from pulmonary vascular disease. 6. Ascending thoracic aortic 4.5 cm aneurysm. Ascending thoracic aortic aneurysm. Recommend semi-annual imaging followup by CTA or MRA and referral to cardiothoracic surgery if not already obtained. This recommendation follows 2010 ACCF/AHA/AATS/ACR/ASA/SCA/SCAI/SIR/STS/SVM Guidelines for the Diagnosis and Management of Patients With Thoracic Aortic Disease. Circulation. 2010; 121: X646-O032. Aortic aneurysm NOS (ICD10-I71.9). 7. No evidence of acute traumatic injury in the abdomen or pelvis on this noncontrast scan. Electronically Signed   By: Ilona Sorrel M.D.   On: 06/04/2019 17:25    Review of Systems  Constitutional: Negative for chills and fever.  HENT: Negative for hearing loss.   Eyes: Negative for blurred vision and double vision.  Respiratory: Positive for cough. Negative for hemoptysis.   Cardiovascular: Positive for chest pain. Negative for palpitations.  Gastrointestinal: Negative for abdominal pain, nausea and vomiting.  Genitourinary: Negative for dysuria and urgency.  Musculoskeletal: Positive for falls. Negative for myalgias and neck pain.  Skin: Negative for itching and rash.  Neurological: Negative for dizziness, tingling and headaches.  Endo/Heme/Allergies: Does not bruise/bleed easily.  Psychiatric/Behavioral: Negative for depression and suicidal  ideas.  All other systems reviewed and are negative.  Blood pressure (!) 143/91, pulse 67, temperature 98 F (36.7 C), temperature source Oral, resp. rate 15, SpO2 91 %.  Physical Exam  Vitals reviewed. Constitutional: He is oriented to person, place, and time. He appears well-developed and well-nourished.  HENT:  Head: Normocephalic and atraumatic.  Eyes: Pupils are equal, round, and reactive to light. Conjunctivae and EOM are normal.  Neck: Normal range of motion. Neck supple.  Cardiovascular: Normal rate and regular rhythm.  Left forearm fistula with good thrill  Respiratory: Effort normal and breath sounds normal.  GI: Soft. Bowel sounds are normal. He exhibits no distension. There is no abdominal tenderness.  Musculoskeletal: Normal range of motion.  Neurological: He is alert and oriented to person, place, and time.  Skin: Skin is warm and dry.  Psychiatric: He has a normal mood and affect. His behavior is normal.   Assessment/Plan: 58 yo male with fall and 2 rib fractures. He is on oxygen and desaturates to the mid 80s on room air. He can cough but has sever pain after coughing -IS, flutter valve -pain control -24h pulse ox -admit to medicine due to multiple major medical problems  ESRD -had dialysis today Asthma- on albuterol and symbicort at home, pulse ox and nebulizer  Procedures: none  Arta Bruce Kailie Polus 06/04/2019, 7:44 PM

## 2019-06-04 NOTE — ED Provider Notes (Signed)
[facility] DEPT Provider Note   CSN: [id number] Arrival date & time: 06/04/19  1458     History   Chief Complaint Chief Complaint  Patient presents with   Fall   Back Pain    HPI Deston Bilyeu is a 58 y.o. male.     Patient is a 58 year old male with a history of stroke, end-stage renal disease on dialysis Tuesday Thursday Saturday, hypertension and CHF who is presenting today after a fall.  Patient was walking in his bedroom and ran into the side of the desk which caused him to fall and hit the right side of his chest on the box spring of the bed.  He states after falling the pain was severe 10 out of 10 and he was unable to get up.  Family finally helped him up but he is having severe pain in the right upper abdomen and chest area as well in the right back.  He denies any head injury or loss of consciousness.  Patient does not take anticoagulation.  He has no pain in the arms or the legs.  He had his last episode of dialysis today but dialyze for 3 hours because he stopped early because of cramping.  He has had 3 sessions this week.  He has not taken anything for pain prior to arrival.  He states that hurts worse with taking a deep breath so he has been breathing very shallow.  The history is provided by the patient.  Fall This is a new problem. The current episode started 1 to 2 hours ago. The problem occurs constantly. The problem has not changed since onset.Associated symptoms include chest pain, abdominal pain and shortness of breath. The symptoms are aggravated by bending and twisting (deep breathing, coughing, palpation). Relieved by: being still helps it to not be as bad. He has tried nothing for the symptoms. The treatment provided no relief.  Back Pain Associated symptoms: abdominal pain and chest pain     Past Medical History:  Diagnosis Date   Acute kidney injury superimposed on CKD ([place]) 05/24/2017   Acute on chronic diastolic CHF  (congestive heart failure) ([place]) 04/13/2018   AKI (acute kidney injury) ([place]) 04/10/2015   Asthma    Asthma, chronic, unspecified asthma severity, with acute exacerbation 10/23/2017   Atypical chest pain 02/16/2018   CAP (community acquired pneumonia) 10/23/2017   Cerebral infarction due to thrombosis of cerebral artery ([place])    Cerebrovascular accident (CVA) ([place])    Chronic kidney disease    CKD (chronic kidney disease), stage V ([place]) 05/24/2017   ESRD (end stage renal disease) on dialysis ([place]) 02/16/2018   Essential hypertension 04/09/2015   GI bleeding 05/24/2017   History of completed stroke    Hyperlipidemia    Hypertension    Hypertensive urgency 05/24/2017   Kidney failure    Respiratory failure, acute ([place]) 10/23/2017   Restless leg syndrome    Stroke [facility])    Symptomatic anemia 05/24/2017    Patient Active Problem List   Diagnosis Date Noted   Acute exacerbation of congestive heart failure ([place]) 05/16/2019   Chest pain 04/13/2018   Acute on chronic diastolic CHF (congestive heart failure) ([place]) 04/13/2018   Chest pressure    Atypical chest pain 02/16/2018   ESRD on dialysis ([place]) 02/16/2018   Volume overload 10/23/2017   Cough 08/23/2017   Constipation    Macrocytic anemia 05/24/2017   Hypertensive urgency 05/24/2017   History of completed stroke  Essential hypertension 04/09/2015    Past Surgical History:  Procedure Laterality Date   AV FISTULA PLACEMENT Left 05/28/2017   Procedure: LEFT ARM ARTERIOVENOUS (AV) FISTULA CREATION;  Surgeon: Conrad Burns Harbor, MD;  Location: [facility];  Service: Vascular;  Laterality: Left;   IR FLUORO GUIDE CV LINE RIGHT  05/25/2017   IR US GUIDE VASC ACCESS RIGHT  05/25/2017   RINOPLASTY          Home Medications    Prior to Admission medications   Medication Sig Start Date End Date Taking? Authorizing Provider  albuterol (PROVENTIL HFA;VENTOLIN HFA) 108 (90 BASE) MCG/ACT inhaler Inhale 1-2 puffs into  the lungs every 6 (six) hours as needed for wheezing or shortness of breath. 04/12/15  Yes [name], MD  amLODipine (NORVASC) 10 MG tablet Take 1 tablet (10 mg total) by mouth daily. 05/18/19  Yes Martyn Malay, MD  aspirin EC 81 MG EC tablet Take 1 tablet (81 mg total) by mouth daily. 04/16/18  Yes Bonnell Public, MD  budesonide-formoterol (SYMBICORT) 80-4.5 MCG/ACT inhaler Inhale 2 puffs into the lungs 2 (two) times daily. 05/18/19  Yes Martyn Malay, MD  carvedilol (COREG) 12.5 MG tablet Take 1 tablet (12.5 mg total) by mouth 2 (two) times daily with a meal. 05/18/19  Yes Martyn Malay, MD  cinacalcet (SENSIPAR) 30 MG tablet Take 30 mg by mouth daily with supper. Do not take less than 12 hours prior to dialysis 01/25/18  Yes [provider]  DUREZOL 0.05 % EMUL Place 1 drop into the right eye See admin instructions. Start 3 days before procedure 05/12/19  Yes [provider]  gabapentin (NEURONTIN) 300 MG capsule Take 300 mg by mouth at bedtime. 05/03/19  Yes [provider]  gatifloxacin (ZYMAXID) 0.5 % SOLN Place 1 drop into the right eye See admin instructions. Start 3 days before procedure 05/13/19  Yes [provider]  hydrALAZINE (APRESOLINE) 100 MG tablet Take 1 tablet (100 mg total) by mouth every 8 (eight) hours. 05/18/19  Yes Martyn Malay, MD  multivitamin (RENA-VIT) TABS tablet Take 1 tablet by mouth daily. 01/13/18  Yes [provider]  pantoprazole (PROTONIX) 40 MG tablet Take 40 mg by mouth daily. 03/29/18  Yes [provider]  rOPINIRole (REQUIP) 0.5 MG tablet Take 0.5 mg by mouth 2 (two) times daily. 01/26/18  Yes [provider]  sevelamer (RENAGEL) 800 MG tablet Take 800-1,600 mg by mouth See admin instructions. 1,600mg  by mouth three times daily and 800mg  twice daily with snacks 01/25/18  Yes [provider]  traMADol-acetaminophen (ULTRACET) 37.5-325 MG tablet Take 1 tablet by mouth every 6 (six)  hours as needed. for pain 05/06/19  Yes [provider]  acetaminophen (TYLENOL) 500 MG tablet Take 1,000 mg by mouth every 6 (six) hours as needed for moderate pain.    [provider]  polyethylene glycol (MIRALAX / GLYCOLAX) packet Take 17 g by mouth daily as needed. Patient not taking: Reported on 05/16/2019 06/02/17   [name], MD    Family History Family History  Problem Relation Age of Onset   Cancer Mother     Social History Social History   Tobacco Use   Smoking status: Never Smoker   Smokeless tobacco: Never Used  Substance Use Topics   Alcohol use: No    Alcohol/week: 0.0 standard drinks    Comment: has a drink once in a while- 07/2018   Drug use: No  Allergies   Patient has no known allergies.   Review of Systems Review of Systems  Respiratory: Positive for shortness of breath.   Cardiovascular: Positive for chest pain.  Gastrointestinal: Positive for abdominal pain.  Musculoskeletal: Positive for back pain.  All other systems reviewed and are negative.    Physical Exam Updated Vital Signs BP 133/67    Pulse 68    Temp 98 F (36.7 C) (Oral)    Resp 19    SpO2 97%   Physical Exam Vitals signs and nursing note reviewed.  Constitutional:      General: He is not in acute distress.    Appearance: He is well-developed and normal weight.  HENT:     Head: Normocephalic and atraumatic.  Eyes:     Conjunctiva/sclera: Conjunctivae normal.     Pupils: Pupils are equal, round, and reactive to light.  Neck:     Musculoskeletal: Normal range of motion and neck supple.  Cardiovascular:     Rate and Rhythm: Normal rate and regular rhythm.     Pulses: Normal pulses.     Heart sounds: No murmur.  Pulmonary:     Effort: Pulmonary effort is normal. No respiratory distress.     Breath sounds: Normal breath sounds. No wheezing or rales.  Chest:     Chest wall: Tenderness present.  Abdominal:     General: There is no  distension.     Palpations: Abdomen is soft.     Tenderness: There is abdominal tenderness in the right upper quadrant. There is no guarding or rebound.  Musculoskeletal: Normal range of motion.        General: No tenderness.       Arms:     Comments: Left arm fistula with thrill  Skin:    General: Skin is warm and dry.     Capillary Refill: Capillary refill takes less than 2 seconds.     Findings: No erythema or rash.  Neurological:     General: No focal deficit present.     Mental Status: He is alert and oriented to person, place, and time.  Psychiatric:        Mood and Affect: Mood normal.        Behavior: Behavior normal.        Thought Content: Thought content normal.      ED Treatments / Results  Labs (all labs ordered are listed, but only abnormal results are displayed) Labs Reviewed  CBC WITH DIFFERENTIAL/PLATELET - Abnormal; Notable for the following components:      Result Value   RBC 2.63 (*)    Hemoglobin 9.6 (*)    HCT 29.5 (*)    MCV 112.2 (*)    MCH 36.5 (*)    Platelets 149 (*)    Lymphs Abs 0.4 (*)    All other components within normal limits  SARS CORONAVIRUS 2 (HOSPITAL ORDER, [facility])  BASIC METABOLIC PANEL    EKG EKG Interpretation  Date/Time:  Saturday June 04 2019 15:27:34 EDT Ventricular Rate:  71 PR Interval:    QRS Duration: 96 QT Interval:  467 QTC Calculation: 508 R Axis:   76 Text Interpretation:  Sinus rhythm Consider left atrial enlargement LVH with secondary repolarization abnormality Prolonged QT interval No significant change since last tracing Confirmed by Blanchie Dessert [phone]) on 06/04/2019 3:50:55 PM   Radiology Ct Abdomen Pelvis Wo Contrast  Result Date: 06/04/2019 CLINICAL DATA:  Trip and fall with right sided  injury against the corner of box spring. Right sided pain. Back pain. EXAM: CT CHEST, ABDOMEN AND PELVIS WITHOUT CONTRAST TECHNIQUE: Multidetector CT imaging of the chest, abdomen  and pelvis was performed following the standard protocol without IV contrast. COMPARISON:  Chest radiograph from earlier today. FINDINGS: CT CHEST FINDINGS Cardiovascular: Mild cardiomegaly. No significant pericardial effusion/thickening. Left anterior descending and left circumflex coronary atherosclerosis. Atherosclerotic thoracic aorta with 4.5 cm ascending thoracic aortic aneurysm. No evidence of acute intramural hematoma in the thoracic aorta. Top-normal caliber main pulmonary artery (3.2 cm diameter). Mediastinum/Nodes: Hypodense 1.0 cm posterior left thyroid lobe nodule. Unremarkable esophagus. No pathologically enlarged axillary, mediastinal or hilar lymph nodes, noting limited sensitivity for the detection of hilar adenopathy on this noncontrast study. Lungs/Pleura: No pneumothorax. Minimal smooth with basilar left pleural thickening with trace dependent left pleural effusion. No right pleural effusion. Diffuse bronchial wall thickening. Patchy consolidation throughout peripheral basilar right lower lobe. Scattered parenchymal bands in basilar left lower lobe. Mosaic attenuation in both lungs. Mild diffuse interlobular septal thickening. No lung masses or significant pulmonary nodules. No central airway stenoses. Musculoskeletal: No aggressive appearing focal osseous lesions. Nondisplaced acute posterolateral right eighth and posterior right ninth rib fractures. Moderate thoracic spondylosis. CT ABDOMEN PELVIS FINDINGS Hepatobiliary: Normal size liver. No liver mass. No appreciable liver laceration on this noncontrast scan. Normal gallbladder with no radiopaque cholelithiasis. No biliary ductal dilatation. Pancreas: Normal, with no mass or duct dilation. Spleen: Normal size. No mass. Adrenals/Urinary Tract: Normal adrenals. Symmetric severe bilateral renal cortical atrophy. No renal stones. No hydronephrosis. Scattered subcentimeter hypodense right renal cortical lesions are too small to characterize and  require no follow-up. No contour deforming left renal lesions. Normal bladder. Stomach/Bowel: Normal non-distended stomach. Normal caliber small bowel with no small bowel wall thickening. Normal appendix. Normal large bowel with no diverticulosis, large bowel wall thickening or pericolonic fat stranding. Vascular/Lymphatic: Atherosclerotic nonaneurysmal abdominal aorta. No pathologically enlarged lymph nodes in the abdomen or pelvis. Reproductive: Top-normal size prostate. Other: No pneumoperitoneum, ascites or focal fluid collection. Small fat containing umbilical hernia. Musculoskeletal: No aggressive appearing focal osseous lesions. No fracture in the abdomen or pelvis. Moderate lumbar spondylosis. IMPRESSION: 1. Acute posterior right eighth and ninth rib fractures. No pneumothorax. 2. Patchy consolidation in the peripheral right lower lobe, compatible with pulmonary contusion. 3. Mild cardiomegaly.  Two-vessel coronary atherosclerosis. 4. Mild diffuse interlobular septal thickening, suggesting mild pulmonary edema. 5. Mosaic attenuation throughout both lungs, a nonspecific finding that can be due to air trapping from small airways disease or mosaic perfusion from pulmonary vascular disease. 6. Ascending thoracic aortic 4.5 cm aneurysm. Ascending thoracic aortic aneurysm. Recommend semi-annual imaging followup by CTA or MRA and referral to cardiothoracic surgery if not already obtained. This recommendation follows 2010 ACCF/AHA/AATS/ACR/ASA/SCA/SCAI/SIR/STS/SVM Guidelines for the Diagnosis and Management of Patients With Thoracic Aortic Disease. Circulation. 2010; 121: O671-I458. Aortic aneurysm NOS (ICD10-I71.9). 7. No evidence of acute traumatic injury in the abdomen or pelvis on this noncontrast scan. Electronically Signed   By: Ilona Sorrel M.D.   On: 06/04/2019 17:25   Dg Chest 2 View  Result Date: 06/04/2019 CLINICAL DATA:  Trip and fall, right-sided rib pain EXAM: CHEST - 2 VIEW COMPARISON:  05/16/2019  FINDINGS: Cardiomegaly. Pulmonary vascular prominence without overt edema. There is a minimally displaced fracture of the posterior right sixth rib. IMPRESSION: 1. There is a minimally displaced fracture of the posterior right sixth rib. No pneumothorax. 2. Cardiomegaly and pulmonary vascular prominence without overt pulmonary edema. No focal airspace opacity.  Electronically Signed   By: Eddie Candle M.D.   On: 06/04/2019 16:07   Ct Chest Wo Contrast  Result Date: 06/04/2019 CLINICAL DATA:  Trip and fall with right sided injury against the corner of box spring. Right sided pain. Back pain. EXAM: CT CHEST, ABDOMEN AND PELVIS WITHOUT CONTRAST TECHNIQUE: Multidetector CT imaging of the chest, abdomen and pelvis was performed following the standard protocol without IV contrast. COMPARISON:  Chest radiograph from earlier today. FINDINGS: CT CHEST FINDINGS Cardiovascular: Mild cardiomegaly. No significant pericardial effusion/thickening. Left anterior descending and left circumflex coronary atherosclerosis. Atherosclerotic thoracic aorta with 4.5 cm ascending thoracic aortic aneurysm. No evidence of acute intramural hematoma in the thoracic aorta. Top-normal caliber main pulmonary artery (3.2 cm diameter). Mediastinum/Nodes: Hypodense 1.0 cm posterior left thyroid lobe nodule. Unremarkable esophagus. No pathologically enlarged axillary, mediastinal or hilar lymph nodes, noting limited sensitivity for the detection of hilar adenopathy on this noncontrast study. Lungs/Pleura: No pneumothorax. Minimal smooth with basilar left pleural thickening with trace dependent left pleural effusion. No right pleural effusion. Diffuse bronchial wall thickening. Patchy consolidation throughout peripheral basilar right lower lobe. Scattered parenchymal bands in basilar left lower lobe. Mosaic attenuation in both lungs. Mild diffuse interlobular septal thickening. No lung masses or significant pulmonary nodules. No central airway  stenoses. Musculoskeletal: No aggressive appearing focal osseous lesions. Nondisplaced acute posterolateral right eighth and posterior right ninth rib fractures. Moderate thoracic spondylosis. CT ABDOMEN PELVIS FINDINGS Hepatobiliary: Normal size liver. No liver mass. No appreciable liver laceration on this noncontrast scan. Normal gallbladder with no radiopaque cholelithiasis. No biliary ductal dilatation. Pancreas: Normal, with no mass or duct dilation. Spleen: Normal size. No mass. Adrenals/Urinary Tract: Normal adrenals. Symmetric severe bilateral renal cortical atrophy. No renal stones. No hydronephrosis. Scattered subcentimeter hypodense right renal cortical lesions are too small to characterize and require no follow-up. No contour deforming left renal lesions. Normal bladder. Stomach/Bowel: Normal non-distended stomach. Normal caliber small bowel with no small bowel wall thickening. Normal appendix. Normal large bowel with no diverticulosis, large bowel wall thickening or pericolonic fat stranding. Vascular/Lymphatic: Atherosclerotic nonaneurysmal abdominal aorta. No pathologically enlarged lymph nodes in the abdomen or pelvis. Reproductive: Top-normal size prostate. Other: No pneumoperitoneum, ascites or focal fluid collection. Small fat containing umbilical hernia. Musculoskeletal: No aggressive appearing focal osseous lesions. No fracture in the abdomen or pelvis. Moderate lumbar spondylosis. IMPRESSION: 1. Acute posterior right eighth and ninth rib fractures. No pneumothorax. 2. Patchy consolidation in the peripheral right lower lobe, compatible with pulmonary contusion. 3. Mild cardiomegaly.  Two-vessel coronary atherosclerosis. 4. Mild diffuse interlobular septal thickening, suggesting mild pulmonary edema. 5. Mosaic attenuation throughout both lungs, a nonspecific finding that can be due to air trapping from small airways disease or mosaic perfusion from pulmonary vascular disease. 6. Ascending  thoracic aortic 4.5 cm aneurysm. Ascending thoracic aortic aneurysm. Recommend semi-annual imaging followup by CTA or MRA and referral to cardiothoracic surgery if not already obtained. This recommendation follows 2010 ACCF/AHA/AATS/ACR/ASA/SCA/SCAI/SIR/STS/SVM Guidelines for the Diagnosis and Management of Patients With Thoracic Aortic Disease. Circulation. 2010; 121: O294-T654. Aortic aneurysm NOS (ICD10-I71.9). 7. No evidence of acute traumatic injury in the abdomen or pelvis on this noncontrast scan. Electronically Signed   By: Ilona Sorrel M.D.   On: 06/04/2019 17:25    Procedures Procedures (including critical care time)  Medications Ordered in ED Medications  HYDROmorphone (DILAUDID) injection 2 mg (has no administration in time range)  ondansetron (ZOFRAN-ODT) disintegrating tablet 4 mg (has no administration in time range)     Initial  Impression / Assessment and Plan / ED Course  I have reviewed the triage vital signs and the nursing notes.  Pertinent labs & imaging results that were available during my care of the patient were reviewed by me and considered in my medical decision making (see chart for details).       Patient is a 58 year old male with a history of end-stage renal disease on dialysis who was last dialysis was today.  Patient did stop at 3 hours because of cramping but he also had dialysis on Monday and Thursday of this week.  Patient got home and he was walking by a dresser and tripped landing on the corner of the box spring of the bed on his right ribs and abdomen.  He states after the fall it was so painful he was having a hard time getting up.  He states it is severely painful to take a deep breath so he is breathing shallowly.  Upon arrival here patient was satting 85% due to shallow breathing.  He did not have any shortness of breath, dizziness or weakness prior to the fall.  Patient had an x-ray here that showed a posterior sixth rib fracture however on exam he has  significant right upper quadrant tenderness as well.  Patient does not take anticoagulation and denies any head injury or loss of consciousness.  Patient will have a CT of the chest and abdomen to rule out any other injury.  Right now he is on 2 L of oxygen and satting 97%.  Will give pain control and see if patient can be weaned from oxygen.  5:57 PM CT is consistent with a posterior eighth and ninth rib fracture with pulmonary contusion.  On reevaluation patient's pain is more controlled however when 2 L of oxygen are turned off sats dropped to 84%.  Patient is still having pain with deep breathing.  Given hypoxia rib fractures and pulmonary contusion feel that patient needs admission for further care.  Patient does not wear oxygen at home.  Patient does have a sending thoracic aneurysm which is recommended for semiannual CTA.  Will consult general surgery.  CBC with stable Hb and Covid neg.  BMP Dr. Kieth Brightly to see the pt.  Final Clinical Impressions(s) / ED Diagnoses   Final diagnoses:  Closed fracture of multiple ribs of right side, initial encounter  Contusion of right lung, initial encounter  Hypoxia    ED Discharge Orders    None       Blanchie Dessert, MD 06/04/19 1929

## 2019-06-04 NOTE — H&P (Signed)
History and Physical    Thomas Robinson OEV:[id number] DOB: [birth year] DOA: 06/04/2019  Referring MD/NP/PA:   PCP: Charlott Rakes, MD   Patient coming from:  The patient is coming from home.  At baseline, pt is independent for most of ADL.        Chief Complaint: fall  HPI: Thomas Robinson is a 58 y.o. male with medical history significant of ESRD-HD (TTS), HTN, asthma, stroke, GERD, depression, CHF, anemia, GIB, who presents with fall.  Pt states that he fell when he was walking in his bedroom, tripped, fell, hit the right side of his chest on the box spring of the bed. He developed pain in the right side of the chest, right flank and right upper abdomen.  The pain is a sharp, 10 out of 10 severity, pleuritic, nonradiating.  He does not have loss consciousness.  Strongly denies head injury or neck injury.  No headache or neck pain.  Patient has some mild shortness of breath, no cough, fever or chills. Patient does not have nausea, vomiting, diarrhea.  No symptoms of UTI. He had his last episode of dialysis today but dialyze for 3 hours because he stopped early because of cramping.  ED Course: pt was found to have WBC 5.6, stable hemoglobin, negative COVID-19 test, potassium 5.0, bicarbonate 25, creatinine 5.78, BUN 27, temperature normal, oxygen saturation 85% on room air, blood pressure 143/91, heart rate 67, tachypnea. Pt is placed on telemetry bed for observation.  CT of chest and abdomen/pelvis showed right rib fracture (eighth and ninth), pulmonary contusion.  General surgeon, Dr. Kieth Brightly was consulted.  CT of abd/pelvis and Chest: 1. Aute posterior right eighth and ninth rib fractures. No pneumothorax. 2. Patchy consolidation in the peripheral right lower lobe, compatible with pulmonary contusion. 3. Mild cardiomegaly.  Two-vessel coronary atherosclerosis. 4. Mild diffuse interlobular septal thickening, suggesting mild pulmonary edema. 5. Mosaic attenuation throughout both lungs,  a nonspecific finding that can be due to air trapping from small airways disease or mosaic perfusion from pulmonary vascular disease. 6. Ascending thoracic aortic 4.5 cm aneurysm. Ascending thoracic aortic aneurysm.   Review of Systems:   General: no fevers, chills, no body weight gain, has fatigue cranial HEENT: no blurry vision, hearing changes or sore throat Respiratory: no dyspnea, coughing, wheezing CV: has right side of chest pain, no palpitations GI: no nausea, vomiting, has uuper abdominal pain, no diarrhea, constipation. Has pain in right flank area. GU: no dysuria, burning on urination, increased urinary frequency, hematuria  Ext: no leg edema Neuro: no unilateral weakness, numbness, or tingling, no vision change or hearing loss. Had fall.  Skin: no rash, no skin tear. MSK: No muscle spasm, no deformity, no limitation of range of movement in spin Heme: No easy bruising.  Travel history: No recent long distant travel.  Allergy: No Known Allergies  Past Medical History:  Diagnosis Date   Acute kidney injury superimposed on CKD ([place]) 05/24/2017   Acute on chronic diastolic CHF (congestive heart failure) ([place]) 04/13/2018   AKI (acute kidney injury) ([place]) 04/10/2015   Asthma    Asthma, chronic, unspecified asthma severity, with acute exacerbation 10/23/2017   Atypical chest pain 02/16/2018   CAP (community acquired pneumonia) 10/23/2017   Cerebral infarction due to thrombosis of cerebral artery (HCC)    Cerebrovascular accident (CVA) ([place])    Chronic kidney disease    CKD (chronic kidney disease), stage V ([place]) 05/24/2017   ESRD (end stage renal disease) on dialysis ([place]) 02/16/2018  Essential hypertension 04/09/2015   GERD (gastroesophageal reflux disease) 06/04/2019   GI bleeding 05/24/2017   History of completed stroke    Hyperlipidemia    Hypertension    Hypertensive urgency 05/24/2017   Kidney failure    Respiratory failure, acute ([place]) 10/23/2017    Restless leg syndrome    Stroke [facility])    Symptomatic anemia 05/24/2017    Past Surgical History:  Procedure Laterality Date   AV FISTULA PLACEMENT Left 05/28/2017   Procedure: LEFT ARM ARTERIOVENOUS (AV) FISTULA CREATION;  Surgeon: Conrad Hartley, MD;  Location: [place];  Service: Vascular;  Laterality: Left;   IR FLUORO GUIDE CV LINE RIGHT  05/25/2017   IR US GUIDE VASC ACCESS RIGHT  05/25/2017   RINOPLASTY      Social History:  reports that he has never smoked. He has never used smokeless tobacco. He reports that he does not drink alcohol or use drugs.  Family History:  Family History  Problem Relation Age of Onset   Cancer Mother      Prior to Admission medications   Medication Sig Start Date End Date Taking? Authorizing Provider  albuterol (PROVENTIL HFA;VENTOLIN HFA) 108 (90 BASE) MCG/ACT inhaler Inhale 1-2 puffs into the lungs every 6 (six) hours as needed for wheezing or shortness of breath. 04/12/15  Yes [name], MD  amLODipine (NORVASC) 10 MG tablet Take 1 tablet (10 mg total) by mouth daily. 05/18/19  Yes Martyn Malay, MD  aspirin EC 81 MG EC tablet Take 1 tablet (81 mg total) by mouth daily. 04/16/18  Yes Bonnell Public, MD  budesonide-formoterol (SYMBICORT) 80-4.5 MCG/ACT inhaler Inhale 2 puffs into the lungs 2 (two) times daily. 05/18/19  Yes Martyn Malay, MD  carvedilol (COREG) 12.5 MG tablet Take 1 tablet (12.5 mg total) by mouth 2 (two) times daily with a meal. 05/18/19  Yes Martyn Malay, MD  cinacalcet (SENSIPAR) 30 MG tablet Take 30 mg by mouth daily with supper. Do not take less than 12 hours prior to dialysis 01/25/18  Yes [provider]  DUREZOL 0.05 % EMUL Place 1 drop into the right eye See admin instructions. Start 3 days before procedure 05/12/19  Yes [provider]  gabapentin (NEURONTIN) 300 MG capsule Take 300 mg by mouth at bedtime. 05/03/19  Yes [provider]  gatifloxacin (ZYMAXID) 0.5 % SOLN Place 1 drop  into the right eye See admin instructions. Start 3 days before procedure 05/13/19  Yes [provider]  hydrALAZINE (APRESOLINE) 100 MG tablet Take 1 tablet (100 mg total) by mouth every 8 (eight) hours. 05/18/19  Yes Martyn Malay, MD  multivitamin (RENA-VIT) TABS tablet Take 1 tablet by mouth daily. 01/13/18  Yes [provider]  pantoprazole (PROTONIX) 40 MG tablet Take 40 mg by mouth daily. 03/29/18  Yes [provider]  rOPINIRole (REQUIP) 0.5 MG tablet Take 0.5 mg by mouth 2 (two) times daily. 01/26/18  Yes [provider]  sevelamer (RENAGEL) 800 MG tablet Take 800-1,600 mg by mouth See admin instructions. 1,600mg  by mouth three times daily and 800mg  twice daily with snacks 01/25/18  Yes [provider]  traMADol-acetaminophen (ULTRACET) 37.5-325 MG tablet Take 1 tablet by mouth every 6 (six) hours as needed. for pain 05/06/19  Yes [provider]  acetaminophen (TYLENOL) 500 MG tablet Take 1,000 mg by mouth every 6 (six) hours as needed for moderate pain.    [provider]  polyethylene glycol (MIRALAX / GLYCOLAX)  packet Take 17 g by mouth daily as needed. Patient not taking: Reported on 05/16/2019 06/02/17   [name], MD    Physical Exam: Vitals:   06/04/19 1922 06/04/19 1930 06/04/19 2000 06/04/19 2038  BP: (!) 146/82 (!) 143/91 139/75 133/71  Pulse: 67 67 (!) 57 66  Resp: 16 15 15 14   Temp:      TempSrc:      SpO2: 90% 91% 90% 91%   General: Not in acute distress HEENT:       Eyes: PERRL, EOMI, no scleral icterus.       ENT: No discharge from the ears and nose, no pharynx injection, no tonsillar enlargement.        Neck: No JVD, no bruit, no mass felt. Heme: No neck lymph node enlargement. Cardiac: S1/S2, RRR, No murmurs, No gallops or rubs.  Chest wall: has tenedernss in right side of rib cage Respiratory:  No rales, wheezing, rhonchi or rubs. GI: Soft, nondistended, nontender, no rebound pain, no  organomegaly, BS present. Has tenderness in right flank area. GU: No hematuria Ext: No pitting leg edema bilaterally. 2+DP/PT pulse bilaterally. Musculoskeletal: No joint deformities, No joint redness or warmth, no limitation of ROM in spin. Skin: No rashes.  Neuro: Alert, oriented X3, cranial nerves II-XII grossly intact, moves all extremities normally.  Psych: Patient is not psychotic, no suicidal or hemocidal ideation.  Labs on Admission: I have personally reviewed following labs and imaging studies  CBC: Recent Labs  Lab 06/04/19 1836  WBC 5.6  NEUTROABS 4.5  HGB 9.6*  HCT 29.5*  MCV 112.2*  PLT 053*   Basic Metabolic Panel: Recent Labs  Lab 06/04/19 1836  NA 137  K 5.0  CL 96*  CO2 25  GLUCOSE 104*  BUN 27*  CREATININE 5.78*  CALCIUM 8.7*   GFR: CrCl cannot be calculated (Unknown ideal weight.). Liver Function Tests: No results for input(s): AST, ALT, ALKPHOS, BILITOT, PROT, ALBUMIN in the last 168 hours. No results for input(s): LIPASE, AMYLASE in the last 168 hours. No results for input(s): AMMONIA in the last 168 hours. Coagulation Profile: No results for input(s): INR, PROTIME in the last 168 hours. Cardiac Enzymes: No results for input(s): CKTOTAL, CKMB, CKMBINDEX, TROPONINI in the last 168 hours. BNP (last 3 results) No results for input(s): PROBNP in the last 8760 hours. HbA1C: No results for input(s): HGBA1C in the last 72 hours. CBG: No results for input(s): GLUCAP in the last 168 hours. Lipid Profile: No results for input(s): CHOL, HDL, LDLCALC, TRIG, CHOLHDL, LDLDIRECT in the last 72 hours. Thyroid Function Tests: No results for input(s): TSH, T4TOTAL, FREET4, T3FREE, THYROIDAB in the last 72 hours. Anemia Panel: No results for input(s): VITAMINB12, FOLATE, FERRITIN, TIBC, IRON, RETICCTPCT in the last 72 hours. Urine analysis:    Component Value Date/Time   COLORURINE YELLOW 05/24/2017 1729   APPEARANCEUR HAZY (A) 05/24/2017 1729   LABSPEC  1.011 05/24/2017 1729   PHURINE 5.0 05/24/2017 1729   GLUCOSEU 50 (A) 05/24/2017 1729   HGBUR SMALL (A) 05/24/2017 1729   BILIRUBINUR NEGATIVE 05/24/2017 1729   KETONESUR NEGATIVE 05/24/2017 1729   PROTEINUR 100 (A) 05/24/2017 1729   UROBILINOGEN 0.2 04/09/2015 1527   NITRITE NEGATIVE 05/24/2017 1729   LEUKOCYTESUR TRACE (A) 05/24/2017 1729   Sepsis Labs: @LABRCNTIP (procalcitonin:4,lacticidven:4) ) Recent Results (from the past 240 hour(s))  SARS Coronavirus 2 [facility] order, Performed in [facility] hospital lab) Nasopharyngeal Nasopharyngeal Swab     Status: None   Collection  Time: 06/04/19  5:58 PM   Specimen: Nasopharyngeal Swab  Result Value Ref Range Status   SARS Coronavirus 2 NEGATIVE NEGATIVE Final    Comment: (NOTE) If result is NEGATIVE SARS-CoV-2 target nucleic acids are NOT DETECTED. The SARS-CoV-2 RNA is generally detectable in upper and lower  respiratory specimens during the acute phase of infection. The lowest  concentration of SARS-CoV-2 viral copies this assay can detect is 250  copies / mL. A negative result does not preclude SARS-CoV-2 infection  and should not be used as the sole basis for treatment or other  patient management decisions.  A negative result may occur with  improper specimen collection / handling, submission of specimen other  than nasopharyngeal swab, presence of viral mutation(s) within the  areas targeted by this assay, and inadequate number of viral copies  (<250 copies / mL). A negative result must be combined with clinical  observations, patient history, and epidemiological information. If result is POSITIVE SARS-CoV-2 target nucleic acids are DETECTED. The SARS-CoV-2 RNA is generally detectable in upper and lower  respiratory specimens dur ing the acute phase of infection.  Positive  results are indicative of active infection with SARS-CoV-2.  Clinical  correlation with patient history and other diagnostic information is    necessary to determine patient infection status.  Positive results do  not rule out bacterial infection or co-infection with other viruses. If result is PRESUMPTIVE POSTIVE SARS-CoV-2 nucleic acids MAY BE PRESENT.   A presumptive positive result was obtained on the submitted specimen  and confirmed on repeat testing.  While 2019 novel coronavirus  (SARS-CoV-2) nucleic acids may be present in the submitted sample  additional confirmatory testing may be necessary for epidemiological  and / or clinical management purposes  to differentiate between  SARS-CoV-2 and other Sarbecovirus currently known to infect humans.  If clinically indicated additional testing with an alternate test  methodology [phone]) is advised. The SARS-CoV-2 RNA is generally  detectable in upper and lower respiratory sp ecimens during the acute  phase of infection. The expected result is Negative. Fact Sheet for Patients:  StrictlyIdeas.no Fact Sheet for Healthcare Providers: BankingDealers.co.za This test is not yet approved or cleared by the [place] FDA and has been authorized for detection and/or diagnosis of SARS-CoV-2 by FDA under an Emergency Use Authorization (EUA).  This EUA will remain in effect (meaning this test can be used) for the duration of the COVID-19 declaration under Section 564(b)(1) of the Act, 21 U.S.C. section 360bbb-3(b)(1), unless the authorization is terminated or revoked sooner. Performed at [facility], [place] [address], [place], [place] [address]      Radiological Exams on Admission: Ct Abdomen Pelvis Wo Contrast  Result Date: 06/04/2019 CLINICAL DATA:  Trip and fall with right sided injury against the corner of box spring. Right sided pain. Back pain. EXAM: CT CHEST, ABDOMEN AND PELVIS WITHOUT CONTRAST TECHNIQUE: Multidetector CT imaging of the chest, abdomen and pelvis was performed following the standard  protocol without IV contrast. COMPARISON:  Chest radiograph from earlier today. FINDINGS: CT CHEST FINDINGS Cardiovascular: Mild cardiomegaly. No significant pericardial effusion/thickening. Left anterior descending and left circumflex coronary atherosclerosis. Atherosclerotic thoracic aorta with 4.5 cm ascending thoracic aortic aneurysm. No evidence of acute intramural hematoma in the thoracic aorta. Top-normal caliber main pulmonary artery (3.2 cm diameter). Mediastinum/Nodes: Hypodense 1.0 cm posterior left thyroid lobe nodule. Unremarkable esophagus. No pathologically enlarged axillary, mediastinal or hilar lymph nodes, noting limited sensitivity for the detection of hilar adenopathy on  this noncontrast study. Lungs/Pleura: No pneumothorax. Minimal smooth with basilar left pleural thickening with trace dependent left pleural effusion. No right pleural effusion. Diffuse bronchial wall thickening. Patchy consolidation throughout peripheral basilar right lower lobe. Scattered parenchymal bands in basilar left lower lobe. Mosaic attenuation in both lungs. Mild diffuse interlobular septal thickening. No lung masses or significant pulmonary nodules. No central airway stenoses. Musculoskeletal: No aggressive appearing focal osseous lesions. Nondisplaced acute posterolateral right eighth and posterior right ninth rib fractures. Moderate thoracic spondylosis. CT ABDOMEN PELVIS FINDINGS Hepatobiliary: Normal size liver. No liver mass. No appreciable liver laceration on this noncontrast scan. Normal gallbladder with no radiopaque cholelithiasis. No biliary ductal dilatation. Pancreas: Normal, with no mass or duct dilation. Spleen: Normal size. No mass. Adrenals/Urinary Tract: Normal adrenals. Symmetric severe bilateral renal cortical atrophy. No renal stones. No hydronephrosis. Scattered subcentimeter hypodense right renal cortical lesions are too small to characterize and require no follow-up. No contour deforming left  renal lesions. Normal bladder. Stomach/Bowel: Normal non-distended stomach. Normal caliber small bowel with no small bowel wall thickening. Normal appendix. Normal large bowel with no diverticulosis, large bowel wall thickening or pericolonic fat stranding. Vascular/Lymphatic: Atherosclerotic nonaneurysmal abdominal aorta. No pathologically enlarged lymph nodes in the abdomen or pelvis. Reproductive: Top-normal size prostate. Other: No pneumoperitoneum, ascites or focal fluid collection. Small fat containing umbilical hernia. Musculoskeletal: No aggressive appearing focal osseous lesions. No fracture in the abdomen or pelvis. Moderate lumbar spondylosis. IMPRESSION: 1. Acute posterior right eighth and ninth rib fractures. No pneumothorax. 2. Patchy consolidation in the peripheral right lower lobe, compatible with pulmonary contusion. 3. Mild cardiomegaly.  Two-vessel coronary atherosclerosis. 4. Mild diffuse interlobular septal thickening, suggesting mild pulmonary edema. 5. Mosaic attenuation throughout both lungs, a nonspecific finding that can be due to air trapping from small airways disease or mosaic perfusion from pulmonary vascular disease. 6. Ascending thoracic aortic 4.5 cm aneurysm. Ascending thoracic aortic aneurysm. Recommend semi-annual imaging followup by CTA or MRA and referral to cardiothoracic surgery if not already obtained. This recommendation follows 2010 ACCF/AHA/AATS/ACR/ASA/SCA/SCAI/SIR/STS/SVM Guidelines for the Diagnosis and Management of Patients With Thoracic Aortic Disease. Circulation. 2010; 121: U440-H474. Aortic aneurysm NOS (ICD10-I71.9). 7. No evidence of acute traumatic injury in the abdomen or pelvis on this noncontrast scan. Electronically Signed   By: Ilona Sorrel M.D.   On: 06/04/2019 17:25   Dg Chest 2 View  Result Date: 06/04/2019 CLINICAL DATA:  Trip and fall, right-sided rib pain EXAM: CHEST - 2 VIEW COMPARISON:  05/16/2019 FINDINGS: Cardiomegaly. Pulmonary vascular  prominence without overt edema. There is a minimally displaced fracture of the posterior right sixth rib. IMPRESSION: 1. There is a minimally displaced fracture of the posterior right sixth rib. No pneumothorax. 2. Cardiomegaly and pulmonary vascular prominence without overt pulmonary edema. No focal airspace opacity. Electronically Signed   By: Eddie Candle M.D.   On: 06/04/2019 16:07   Ct Chest Wo Contrast  Result Date: 06/04/2019 CLINICAL DATA:  Trip and fall with right sided injury against the corner of box spring. Right sided pain. Back pain. EXAM: CT CHEST, ABDOMEN AND PELVIS WITHOUT CONTRAST TECHNIQUE: Multidetector CT imaging of the chest, abdomen and pelvis was performed following the standard protocol without IV contrast. COMPARISON:  Chest radiograph from earlier today. FINDINGS: CT CHEST FINDINGS Cardiovascular: Mild cardiomegaly. No significant pericardial effusion/thickening. Left anterior descending and left circumflex coronary atherosclerosis. Atherosclerotic thoracic aorta with 4.5 cm ascending thoracic aortic aneurysm. No evidence of acute intramural hematoma in the thoracic aorta. Top-normal caliber main pulmonary artery (3.2  cm diameter). Mediastinum/Nodes: Hypodense 1.0 cm posterior left thyroid lobe nodule. Unremarkable esophagus. No pathologically enlarged axillary, mediastinal or hilar lymph nodes, noting limited sensitivity for the detection of hilar adenopathy on this noncontrast study. Lungs/Pleura: No pneumothorax. Minimal smooth with basilar left pleural thickening with trace dependent left pleural effusion. No right pleural effusion. Diffuse bronchial wall thickening. Patchy consolidation throughout peripheral basilar right lower lobe. Scattered parenchymal bands in basilar left lower lobe. Mosaic attenuation in both lungs. Mild diffuse interlobular septal thickening. No lung masses or significant pulmonary nodules. No central airway stenoses. Musculoskeletal: No aggressive  appearing focal osseous lesions. Nondisplaced acute posterolateral right eighth and posterior right ninth rib fractures. Moderate thoracic spondylosis. CT ABDOMEN PELVIS FINDINGS Hepatobiliary: Normal size liver. No liver mass. No appreciable liver laceration on this noncontrast scan. Normal gallbladder with no radiopaque cholelithiasis. No biliary ductal dilatation. Pancreas: Normal, with no mass or duct dilation. Spleen: Normal size. No mass. Adrenals/Urinary Tract: Normal adrenals. Symmetric severe bilateral renal cortical atrophy. No renal stones. No hydronephrosis. Scattered subcentimeter hypodense right renal cortical lesions are too small to characterize and require no follow-up. No contour deforming left renal lesions. Normal bladder. Stomach/Bowel: Normal non-distended stomach. Normal caliber small bowel with no small bowel wall thickening. Normal appendix. Normal large bowel with no diverticulosis, large bowel wall thickening or pericolonic fat stranding. Vascular/Lymphatic: Atherosclerotic nonaneurysmal abdominal aorta. No pathologically enlarged lymph nodes in the abdomen or pelvis. Reproductive: Top-normal size prostate. Other: No pneumoperitoneum, ascites or focal fluid collection. Small fat containing umbilical hernia. Musculoskeletal: No aggressive appearing focal osseous lesions. No fracture in the abdomen or pelvis. Moderate lumbar spondylosis. IMPRESSION: 1. Acute posterior right eighth and ninth rib fractures. No pneumothorax. 2. Patchy consolidation in the peripheral right lower lobe, compatible with pulmonary contusion. 3. Mild cardiomegaly.  Two-vessel coronary atherosclerosis. 4. Mild diffuse interlobular septal thickening, suggesting mild pulmonary edema. 5. Mosaic attenuation throughout both lungs, a nonspecific finding that can be due to air trapping from small airways disease or mosaic perfusion from pulmonary vascular disease. 6. Ascending thoracic aortic 4.5 cm aneurysm. Ascending  thoracic aortic aneurysm. Recommend semi-annual imaging followup by CTA or MRA and referral to cardiothoracic surgery if not already obtained. This recommendation follows 2010 ACCF/AHA/AATS/ACR/ASA/SCA/SCAI/SIR/STS/SVM Guidelines for the Diagnosis and Management of Patients With Thoracic Aortic Disease. Circulation. 2010; 121: P382-N053. Aortic aneurysm NOS (ICD10-I71.9). 7. No evidence of acute traumatic injury in the abdomen or pelvis on this noncontrast scan. Electronically Signed   By: Ilona Sorrel M.D.   On: 06/04/2019 17:25     EKG: Independently reviewed.  Sinus rhythm, QTC 508, LAE, T wave inversion in lateral leads.  Assessment/Plan Principal Problem:   Fall at home, initial encounter Active Problems:   Essential hypertension   ESRD on dialysis [facility])   Right rib fracture   Lung contusion   Acute respiratory failure with hypoxia (HCC)   Anemia in ESRD (end-stage renal disease) (HCC)   GERD (gastroesophageal reflux disease)   Chronic diastolic (congestive) heart failure [facility])   Fall   Thoracic ascending aortic aneurysm ([place])   Fall at home, initial encounter and lung contusion and right rib fracture (8th and 9th): Dr. Kieth Brightly was consulted.  Patient has hypoxia which is likely due to unable to take deep breath 2/2 severe pain.  -will place on thele bed for obs -Highly appreciate general surgeons recommendation -As needed Percocet and morphine for pain -Lidoderm patch -Incentive spirometer and flutter -Continue with oxygen by nasal cannula -pt/ot  Acute respiratory failure with hypoxia:  Oxygen desaturation to 85% on room air.  This is most likely due to unable to take deep breath secondary to severe pain.  Patient has history of asthma and CHF, but no wheezing or rhonchi on auscultation.  No signs of CHF exacerbation. -As needed albuterol nebulizer, Dulera inhaler -prnMucinex for cough  Essential hypertension: -oral  Hydralazine prn -Continue home medications:  Amlodipine, Coreg, hydralazine  ESRD on dialysis [facility]):  -please call renal for HD if pt stay to next week (less likely)  Anemia in ESRD (end-stage renal disease) ([place]): Hemoglobin stable.  8.9 on 02/18/2020, 9.6 today -f/u by CBC  GERD (gastroesophageal reflux disease): -Protonix  Chronic diastolic (congestive) heart failure ([place]): 2D echo on 05/17/2019 showed EF>65 with grade 1 diastolic dysfunction.  Patient does not have leg edema JVD.  Patient has been compliant to dialysis.  CHF is compensated. -Watch volume status closely  Thoracic ascending aortic aneurysm [facility]): CT scan showed an scending thoracic aortic 4.5 cm aneurysm -f/u with PCP and may need to give referral to thoracic surgeon   DVT ppx: SCD Code Status: Full code Family Communication: None at bed side Disposition Plan:  Anticipate discharge back to previous home environment Consults called:  Dr. Kieth Brightly of general surgeon Admission status: Obs / tele    Date of Service 06/04/2019    [facility]   If 7PM-7AM, please contact night-coverage [email or web address] Password [email or web address] 06/04/2019, 8:56 PM

## 2019-06-04 NOTE — ED Notes (Signed)
MD at bedside. 

## 2019-06-04 NOTE — ED Notes (Signed)
Patient transported to CT 

## 2019-06-04 NOTE — ED Notes (Signed)
ED TO INPATIENT HANDOFF REPORT  Name/Age/Gender [name] 58 y.o. male  Code Status    Code Status Orders  (From admission, onward)         Start     Ordered   06/04/19 2100  Full code  Continuous     06/04/19 2059        Code Status History    Date Active Date Inactive Code Status Order ID Comments User Context   05/16/2019 1702 05/18/2019 1422 Full Code [id number]  Kathrene Alu, MD ED   04/13/2018 2250 04/15/2018 1911 Full Code [id number]  Vianne Bulls, MD ED   02/16/2018 1626 02/17/2018 1520 Full Code [id number]  Welford Roche, MD ED   10/24/2017 0146 10/25/2017 0735 Full Code [id number]  Gwynne Edinger, MD ED   08/24/2017 0338 08/25/2017 0005 Full Code [id number]  Vilma Prader, MD Inpatient   05/24/2017 2140 06/02/2017 1225 Full Code [id number]  Vianne Bulls, MD ED   04/09/2015 2233 04/12/2015 2107 Full Code [id number]  Florencia Reasons, MD Inpatient   Advance Care Planning Activity      Home/SNF/Other Home  Chief Complaint back & side pain post fall  Level of Care/Admitting Diagnosis ED Disposition    ED Disposition Condition [facility]: [place] [[id number]]  Level of Care: Telemetry Medical [104]  I expect the patient will be discharged within 24 hours: No (not a candidate for 5C-Observation unit)  Covid Evaluation: Confirmed COVID Negative  Diagnosis: Fall [[id number]]  Admitting Physician: Ivor Costa [[id number]]  Attending Physician: Ivor Costa [[id number]]  PT Class (Do Not Modify): Observation [104]  PT Acc Code (Do Not Modify): Observation [10022]       Medical History Past Medical History:  Diagnosis Date  . Acute kidney injury superimposed on CKD ([place]) 05/24/2017  . Acute on chronic diastolic CHF (congestive heart failure) ([place]) 04/13/2018  . AKI (acute kidney injury) ([place]) 04/10/2015  . Asthma   . Asthma, chronic, unspecified asthma severity, with acute exacerbation 10/23/2017  . Atypical chest pain 02/16/2018  .  CAP (community acquired pneumonia) 10/23/2017  . Cerebral infarction due to thrombosis of cerebral artery ([place])   . Cerebrovascular accident (CVA) ([place])   . Chronic kidney disease   . CKD (chronic kidney disease), stage V ([place]) 05/24/2017  . ESRD (end stage renal disease) on dialysis ([place]) 02/16/2018  . Essential hypertension 04/09/2015  . GERD (gastroesophageal reflux disease) 06/04/2019  . GI bleeding 05/24/2017  . History of completed stroke   . Hyperlipidemia   . Hypertension   . Hypertensive urgency 05/24/2017  . Kidney failure   . Respiratory failure, acute ([place]) 10/23/2017  . Restless leg syndrome   . Stroke ([place])   . Symptomatic anemia 05/24/2017    Allergies No Known Allergies  IV Location/Drains/Wounds Patient Lines/Drains/Airways Status   Active Line/Drains/Airways    Name:   Placement date:   Placement time:   Site:   Days:   Fistula / Graft Left Forearm Arteriovenous fistula   05/28/17    0807    Forearm   737   Fistula / Graft Left Forearm Arteriovenous fistula   04/14/18    0630    Forearm   416   Incision (Closed) 05/28/17 Arm Left   05/28/17    0806     737          Labs/Imaging Results for orders placed or performed during the hospital encounter of 06/04/19 (from  the past 48 hour(s))  SARS Coronavirus 2 [facility] order, Performed in [facility] hospital lab) Nasopharyngeal Nasopharyngeal Swab     Status: None   Collection Time: 06/04/19  5:58 PM   Specimen: Nasopharyngeal Swab  Result Value Ref Range   SARS Coronavirus 2 NEGATIVE NEGATIVE    Comment: (NOTE) If result is NEGATIVE SARS-CoV-2 target nucleic acids are NOT DETECTED. The SARS-CoV-2 RNA is generally detectable in upper and lower  respiratory specimens during the acute phase of infection. The lowest  concentration of SARS-CoV-2 viral copies this assay can detect is 250  copies / mL. A negative result does not preclude SARS-CoV-2 infection  and should not be used as the sole basis for treatment or  other  patient management decisions.  A negative result may occur with  improper specimen collection / handling, submission of specimen other  than nasopharyngeal swab, presence of viral mutation(s) within the  areas targeted by this assay, and inadequate number of viral copies  (<250 copies / mL). A negative result must be combined with clinical  observations, patient history, and epidemiological information. If result is POSITIVE SARS-CoV-2 target nucleic acids are DETECTED. The SARS-CoV-2 RNA is generally detectable in upper and lower  respiratory specimens dur ing the acute phase of infection.  Positive  results are indicative of active infection with SARS-CoV-2.  Clinical  correlation with patient history and other diagnostic information is  necessary to determine patient infection status.  Positive results do  not rule out bacterial infection or co-infection with other viruses. If result is PRESUMPTIVE POSTIVE SARS-CoV-2 nucleic acids MAY BE PRESENT.   A presumptive positive result was obtained on the submitted specimen  and confirmed on repeat testing.  While 2019 novel coronavirus  (SARS-CoV-2) nucleic acids may be present in the submitted sample  additional confirmatory testing may be necessary for epidemiological  and / or clinical management purposes  to differentiate between  SARS-CoV-2 and other Sarbecovirus currently known to infect humans.  If clinically indicated additional testing with an alternate test  methodology [phone]) is advised. The SARS-CoV-2 RNA is generally  detectable in upper and lower respiratory sp ecimens during the acute  phase of infection. The expected result is Negative. Fact Sheet for Patients:  StrictlyIdeas.no Fact Sheet for Healthcare Providers: BankingDealers.co.za This test is not yet approved or cleared by the [place] FDA and has been authorized for detection and/or diagnosis of  SARS-CoV-2 by FDA under an Emergency Use Authorization (EUA).  This EUA will remain in effect (meaning this test can be used) for the duration of the COVID-19 declaration under Section 564(b)(1) of the Act, 21 U.S.C. section 360bbb-3(b)(1), unless the authorization is terminated or revoked sooner. Performed at [facility], [place] [address], [place], [place] [address]   CBC with Differential/Platelet     Status: Abnormal   Collection Time: 06/04/19  6:36 PM  Result Value Ref Range   WBC 5.6 4.0 - 10.5 K/uL   RBC 2.63 (L) 4.22 - 5.81 MIL/uL   Hemoglobin 9.6 (L) 13.0 - 17.0 g/dL   HCT 29.5 (L) 39.0 - 52.0 %   MCV 112.2 (H) 80.0 - 100.0 fL   MCH 36.5 (H) 26.0 - 34.0 pg   MCHC 32.5 30.0 - 36.0 g/dL   RDW 15.3 11.5 - 15.5 %   Platelets 149 (L) 150 - 400 K/uL   nRBC 0.0 0.0 - 0.2 %   Neutrophils Relative % 81 %   Neutro Abs 4.5 1.7 - 7.7  K/uL   Lymphocytes Relative 7 %   Lymphs Abs 0.4 (L) 0.7 - 4.0 K/uL   Monocytes Relative 7 %   Monocytes Absolute 0.4 0.1 - 1.0 K/uL   Eosinophils Relative 4 %   Eosinophils Absolute 0.2 0.0 - 0.5 K/uL   Basophils Relative 1 %   Basophils Absolute 0.0 0.0 - 0.1 K/uL   Immature Granulocytes 0 %   Abs Immature Granulocytes 0.02 0.00 - 0.07 K/uL    Comment: Performed at [facility], [place] [address], [place], [place] [address]  Basic metabolic panel     Status: Abnormal   Collection Time: 06/04/19  6:36 PM  Result Value Ref Range   Sodium 137 135 - 145 mmol/L   Potassium 5.0 3.5 - 5.1 mmol/L   Chloride 96 (L) 98 - 111 mmol/L   CO2 25 22 - 32 mmol/L   Glucose, Bld 104 (H) 70 - 99 mg/dL   BUN 27 (H) 6 - 20 mg/dL   Creatinine, Ser 5.78 (H) 0.61 - 1.24 mg/dL   Calcium 8.7 (L) 8.9 - 10.3 mg/dL   GFR calc non Af Amer 10 (L) >60 mL/min   GFR calc Af Amer 11 (L) >60 mL/min   Anion gap 16 (H) 5 - 15    Comment: Performed at [facility], [place] [address], [place], [place] [address]   Ct Abdomen  Pelvis Wo Contrast  Result Date: 06/04/2019 CLINICAL DATA:  Trip and fall with right sided injury against the corner of [place]. Right sided pain. Back pain. EXAM: CT CHEST, ABDOMEN AND PELVIS WITHOUT CONTRAST TECHNIQUE: Multidetector CT imaging of the chest, abdomen and pelvis was performed following the standard protocol without IV contrast. COMPARISON:  Chest radiograph from earlier today. FINDINGS: CT CHEST FINDINGS Cardiovascular: Mild cardiomegaly. No significant pericardial effusion/thickening. Left anterior descending and left circumflex coronary atherosclerosis. Atherosclerotic thoracic aorta with 4.5 cm ascending thoracic aortic aneurysm. No evidence of acute intramural hematoma in the thoracic aorta. Top-normal caliber main pulmonary artery (3.2 cm diameter). Mediastinum/Nodes: Hypodense 1.0 cm posterior left thyroid lobe nodule. Unremarkable esophagus. No pathologically enlarged axillary, mediastinal or hilar lymph nodes, noting limited sensitivity for the detection of hilar adenopathy on this noncontrast study. Lungs/Pleura: No pneumothorax. Minimal smooth with basilar left pleural thickening with trace dependent left pleural effusion. No right pleural effusion. Diffuse bronchial wall thickening. Patchy consolidation throughout peripheral basilar right lower lobe. Scattered parenchymal bands in basilar left lower lobe. Mosaic attenuation in both lungs. Mild diffuse interlobular septal thickening. No lung masses or significant pulmonary nodules. No central airway stenoses. Musculoskeletal: No aggressive appearing focal osseous lesions. Nondisplaced acute posterolateral right eighth and posterior right ninth rib fractures. Moderate thoracic spondylosis. CT ABDOMEN PELVIS FINDINGS Hepatobiliary: Normal size liver. No liver mass. No appreciable liver laceration on this noncontrast scan. Normal gallbladder with no radiopaque cholelithiasis. No biliary ductal dilatation. Pancreas: Normal, with no mass or  duct dilation. Spleen: Normal size. No mass. Adrenals/Urinary Tract: Normal adrenals. Symmetric severe bilateral renal cortical atrophy. No renal stones. No hydronephrosis. Scattered subcentimeter hypodense right renal cortical lesions are too small to characterize and require no follow-up. No contour deforming left renal lesions. Normal bladder. Stomach/Bowel: Normal non-distended stomach. Normal caliber small bowel with no small bowel wall thickening. Normal appendix. Normal large bowel with no diverticulosis, large bowel wall thickening or pericolonic fat stranding. Vascular/Lymphatic: Atherosclerotic nonaneurysmal abdominal aorta. No pathologically enlarged lymph nodes in the abdomen or pelvis. Reproductive: Top-normal size prostate. Other: No pneumoperitoneum, ascites  or focal fluid collection. Small fat containing umbilical hernia. Musculoskeletal: No aggressive appearing focal osseous lesions. No fracture in the abdomen or pelvis. Moderate lumbar spondylosis. IMPRESSION: 1. Acute posterior right eighth and ninth rib fractures. No pneumothorax. 2. Patchy consolidation in the peripheral right lower lobe, compatible with pulmonary contusion. 3. Mild cardiomegaly.  Two-vessel coronary atherosclerosis. 4. Mild diffuse interlobular septal thickening, suggesting mild pulmonary edema. 5. Mosaic attenuation throughout both lungs, a nonspecific finding that can be due to air trapping from small airways disease or mosaic perfusion from pulmonary vascular disease. 6. Ascending thoracic aortic 4.5 cm aneurysm. Ascending thoracic aortic aneurysm. Recommend semi-annual imaging followup by CTA or MRA and referral to cardiothoracic surgery if not already obtained. This recommendation follows 2010 ACCF/AHA/AATS/ACR/ASA/SCA/SCAI/SIR/STS/SVM Guidelines for the Diagnosis and Management of Patients With Thoracic Aortic Disease. Circulation. 2010; 121: Z308-M578. Aortic aneurysm NOS (ICD10-I71.9). 7. No evidence of acute traumatic  injury in the abdomen or pelvis on this noncontrast scan. Electronically Signed   By: Ilona Sorrel M.D.   On: 06/04/2019 17:25   Dg Chest 2 View  Result Date: 06/04/2019 CLINICAL DATA:  Trip and fall, right-sided rib pain EXAM: CHEST - 2 VIEW COMPARISON:  05/16/2019 FINDINGS: Cardiomegaly. Pulmonary vascular prominence without overt edema. There is a minimally displaced fracture of the posterior right sixth rib. IMPRESSION: 1. There is a minimally displaced fracture of the posterior right sixth rib. No pneumothorax. 2. Cardiomegaly and pulmonary vascular prominence without overt pulmonary edema. No focal airspace opacity. Electronically Signed   By: Eddie Candle M.D.   On: 06/04/2019 16:07   Ct Chest Wo Contrast  Result Date: 06/04/2019 CLINICAL DATA:  Trip and fall with right sided injury against the corner of [place]. Right sided pain. Back pain. EXAM: CT CHEST, ABDOMEN AND PELVIS WITHOUT CONTRAST TECHNIQUE: Multidetector CT imaging of the chest, abdomen and pelvis was performed following the standard protocol without IV contrast. COMPARISON:  Chest radiograph from earlier today. FINDINGS: CT CHEST FINDINGS Cardiovascular: Mild cardiomegaly. No significant pericardial effusion/thickening. Left anterior descending and left circumflex coronary atherosclerosis. Atherosclerotic thoracic aorta with 4.5 cm ascending thoracic aortic aneurysm. No evidence of acute intramural hematoma in the thoracic aorta. Top-normal caliber main pulmonary artery (3.2 cm diameter). Mediastinum/Nodes: Hypodense 1.0 cm posterior left thyroid lobe nodule. Unremarkable esophagus. No pathologically enlarged axillary, mediastinal or hilar lymph nodes, noting limited sensitivity for the detection of hilar adenopathy on this noncontrast study. Lungs/Pleura: No pneumothorax. Minimal smooth with basilar left pleural thickening with trace dependent left pleural effusion. No right pleural effusion. Diffuse bronchial wall thickening. Patchy  consolidation throughout peripheral basilar right lower lobe. Scattered parenchymal bands in basilar left lower lobe. Mosaic attenuation in both lungs. Mild diffuse interlobular septal thickening. No lung masses or significant pulmonary nodules. No central airway stenoses. Musculoskeletal: No aggressive appearing focal osseous lesions. Nondisplaced acute posterolateral right eighth and posterior right ninth rib fractures. Moderate thoracic spondylosis. CT ABDOMEN PELVIS FINDINGS Hepatobiliary: Normal size liver. No liver mass. No appreciable liver laceration on this noncontrast scan. Normal gallbladder with no radiopaque cholelithiasis. No biliary ductal dilatation. Pancreas: Normal, with no mass or duct dilation. Spleen: Normal size. No mass. Adrenals/Urinary Tract: Normal adrenals. Symmetric severe bilateral renal cortical atrophy. No renal stones. No hydronephrosis. Scattered subcentimeter hypodense right renal cortical lesions are too small to characterize and require no follow-up. No contour deforming left renal lesions. Normal bladder. Stomach/Bowel: Normal non-distended stomach. Normal caliber small bowel with no small bowel wall thickening. Normal appendix. Normal large bowel with no  diverticulosis, large bowel wall thickening or pericolonic fat stranding. Vascular/Lymphatic: Atherosclerotic nonaneurysmal abdominal aorta. No pathologically enlarged lymph nodes in the abdomen or pelvis. Reproductive: Top-normal size prostate. Other: No pneumoperitoneum, ascites or focal fluid collection. Small fat containing umbilical hernia. Musculoskeletal: No aggressive appearing focal osseous lesions. No fracture in the abdomen or pelvis. Moderate lumbar spondylosis. IMPRESSION: 1. Acute posterior right eighth and ninth rib fractures. No pneumothorax. 2. Patchy consolidation in the peripheral right lower lobe, compatible with pulmonary contusion. 3. Mild cardiomegaly.  Two-vessel coronary atherosclerosis. 4. Mild diffuse  interlobular septal thickening, suggesting mild pulmonary edema. 5. Mosaic attenuation throughout both lungs, a nonspecific finding that can be due to air trapping from small airways disease or mosaic perfusion from pulmonary vascular disease. 6. Ascending thoracic aortic 4.5 cm aneurysm. Ascending thoracic aortic aneurysm. Recommend semi-annual imaging followup by CTA or MRA and referral to cardiothoracic surgery if not already obtained. This recommendation follows 2010 ACCF/AHA/AATS/ACR/ASA/SCA/SCAI/SIR/STS/SVM Guidelines for the Diagnosis and Management of Patients With Thoracic Aortic Disease. Circulation. 2010; 121: T557-D220. Aortic aneurysm NOS (ICD10-I71.9). 7. No evidence of acute traumatic injury in the abdomen or pelvis on this noncontrast scan. Electronically Signed   By: Ilona Sorrel M.D.   On: 06/04/2019 17:25    Pending Labs Unresulted Labs (From admission, onward)    Start     Ordered   06/05/19 0500  CBC  Tomorrow morning,   R     06/04/19 2059   06/04/19 2100  APTT  Once,   STAT     06/04/19 2059   06/04/19 2100  Protime-INR  Once,   STAT     06/04/19 2059          Vitals/Pain Today's Vitals   06/04/19 2038 06/04/19 2100 06/04/19 2130 06/04/19 2201  BP: 133/71 134/70 (!) 141/74 134/72  Pulse: 66 66 (!) 56 (!) 57  Resp: 14 14 14 15   Temp:      TempSrc:      SpO2: 91% 92% 96% 93%  PainSc:        Isolation Precautions No active isolations  Medications Medications  lidocaine (LIDODERM) 5 % 1 patch (1 patch Transdermal Patch Applied 06/04/19 1808)  oxyCODONE-acetaminophen (PERCOCET/ROXICET) 5-325 MG per tablet 1 tablet (has no administration in time range)  morphine 2 MG/ML injection 2 mg (has no administration in time range)  aspirin EC tablet 81 mg (has no administration in time range)  amLODipine (NORVASC) tablet 10 mg (has no administration in time range)  carvedilol (COREG) tablet 12.5 mg (has no administration in time range)  hydrALAZINE (APRESOLINE) tablet  100 mg (has no administration in time range)  cinacalcet (SENSIPAR) tablet 30 mg (has no administration in time range)  pantoprazole (PROTONIX) EC tablet 40 mg (has no administration in time range)  sevelamer carbonate (RENVELA) tablet 800 mg (has no administration in time range)  gabapentin (NEURONTIN) capsule 300 mg (has no administration in time range)  rOPINIRole (REQUIP) tablet 0.5 mg (has no administration in time range)  multivitamin (RENA-VIT) tablet 1 tablet (has no administration in time range)  albuterol (VENTOLIN HFA) 108 (90 Base) MCG/ACT inhaler 2 puff (has no administration in time range)  mometasone-formoterol (DULERA) 100-5 MCG/ACT inhaler 2 puff (has no administration in time range)  dextromethorphan-guaiFENesin (MUCINEX DM) 30-600 MG per 12 hr tablet 1 tablet (has no administration in time range)  acetaminophen (TYLENOL) tablet 650 mg (has no administration in time range)    Or  acetaminophen (TYLENOL) suppository 650 mg (has no administration in  time range)  hydrALAZINE (APRESOLINE) tablet 25 mg (has no administration in time range)  HYDROmorphone (DILAUDID) injection 2 mg (2 mg Intramuscular Given 06/04/19 1638)  ondansetron (ZOFRAN-ODT) disintegrating tablet 4 mg (4 mg Oral Given 06/04/19 1637)    Mobility walks

## 2019-06-04 NOTE — ED Triage Notes (Signed)
Pt tripped and fell hitting rt side against the corner of [place]. Presents with rt side, back and abd pain

## 2019-06-05 ENCOUNTER — Observation Stay (HOSPITAL_COMMUNITY): Payer: Medicare Other

## 2019-06-05 DIAGNOSIS — D631 Anemia in chronic kidney disease: Secondary | ICD-10-CM | POA: Diagnosis present

## 2019-06-05 DIAGNOSIS — S2241XA Multiple fractures of ribs, right side, initial encounter for closed fracture: Secondary | ICD-10-CM | POA: Diagnosis present

## 2019-06-05 DIAGNOSIS — Z8673 Personal history of transient ischemic attack (TIA), and cerebral infarction without residual deficits: Secondary | ICD-10-CM | POA: Diagnosis not present

## 2019-06-05 DIAGNOSIS — Z992 Dependence on renal dialysis: Secondary | ICD-10-CM | POA: Diagnosis not present

## 2019-06-05 DIAGNOSIS — S20219A Contusion of unspecified front wall of thorax, initial encounter: Secondary | ICD-10-CM | POA: Diagnosis not present

## 2019-06-05 DIAGNOSIS — Z7951 Long term (current) use of inhaled steroids: Secondary | ICD-10-CM | POA: Diagnosis not present

## 2019-06-05 DIAGNOSIS — I132 Hypertensive heart and chronic kidney disease with heart failure and with stage 5 chronic kidney disease, or end stage renal disease: Secondary | ICD-10-CM | POA: Diagnosis present

## 2019-06-05 DIAGNOSIS — S2249XA Multiple fractures of ribs, unspecified side, initial encounter for closed fracture: Secondary | ICD-10-CM | POA: Diagnosis present

## 2019-06-05 DIAGNOSIS — R0902 Hypoxemia: Secondary | ICD-10-CM | POA: Diagnosis not present

## 2019-06-05 DIAGNOSIS — S27321A Contusion of lung, unilateral, initial encounter: Secondary | ICD-10-CM | POA: Diagnosis present

## 2019-06-05 DIAGNOSIS — Y92009 Unspecified place in unspecified non-institutional (private) residence as the place of occurrence of the external cause: Secondary | ICD-10-CM | POA: Diagnosis not present

## 2019-06-05 DIAGNOSIS — Y9301 Activity, walking, marching and hiking: Secondary | ICD-10-CM | POA: Diagnosis present

## 2019-06-05 DIAGNOSIS — I712 Thoracic aortic aneurysm, without rupture: Secondary | ICD-10-CM | POA: Diagnosis present

## 2019-06-05 DIAGNOSIS — Z20828 Contact with and (suspected) exposure to other viral communicable diseases: Secondary | ICD-10-CM | POA: Diagnosis present

## 2019-06-05 DIAGNOSIS — Z7982 Long term (current) use of aspirin: Secondary | ICD-10-CM | POA: Diagnosis not present

## 2019-06-05 DIAGNOSIS — E8889 Other specified metabolic disorders: Secondary | ICD-10-CM | POA: Diagnosis present

## 2019-06-05 DIAGNOSIS — I5032 Chronic diastolic (congestive) heart failure: Secondary | ICD-10-CM | POA: Diagnosis present

## 2019-06-05 DIAGNOSIS — Z79899 Other long term (current) drug therapy: Secondary | ICD-10-CM | POA: Diagnosis not present

## 2019-06-05 DIAGNOSIS — Y92012 Bathroom of single-family (private) house as the place of occurrence of the external cause: Secondary | ICD-10-CM | POA: Diagnosis not present

## 2019-06-05 DIAGNOSIS — E785 Hyperlipidemia, unspecified: Secondary | ICD-10-CM | POA: Diagnosis present

## 2019-06-05 DIAGNOSIS — W010XXA Fall on same level from slipping, tripping and stumbling without subsequent striking against object, initial encounter: Secondary | ICD-10-CM | POA: Diagnosis present

## 2019-06-05 DIAGNOSIS — G2581 Restless legs syndrome: Secondary | ICD-10-CM | POA: Diagnosis present

## 2019-06-05 DIAGNOSIS — N2581 Secondary hyperparathyroidism of renal origin: Secondary | ICD-10-CM | POA: Diagnosis present

## 2019-06-05 DIAGNOSIS — N186 End stage renal disease: Secondary | ICD-10-CM | POA: Diagnosis present

## 2019-06-05 DIAGNOSIS — W19XXXA Unspecified fall, initial encounter: Secondary | ICD-10-CM | POA: Diagnosis not present

## 2019-06-05 DIAGNOSIS — S2239XA Fracture of one rib, unspecified side, initial encounter for closed fracture: Secondary | ICD-10-CM | POA: Diagnosis present

## 2019-06-05 DIAGNOSIS — J45909 Unspecified asthma, uncomplicated: Secondary | ICD-10-CM | POA: Diagnosis present

## 2019-06-05 DIAGNOSIS — J9601 Acute respiratory failure with hypoxia: Secondary | ICD-10-CM | POA: Diagnosis present

## 2019-06-05 DIAGNOSIS — Z9115 Patient's noncompliance with renal dialysis: Secondary | ICD-10-CM | POA: Diagnosis not present

## 2019-06-05 DIAGNOSIS — K219 Gastro-esophageal reflux disease without esophagitis: Secondary | ICD-10-CM | POA: Diagnosis present

## 2019-06-05 MED ORDER — POLYETHYLENE GLYCOL 3350 17 G PO PACK
17.0000 g | PACK | Freq: Every day | ORAL | Status: DC
  Filled 2019-06-05 (×2): qty 1

## 2019-06-05 MED ORDER — ALBUTEROL SULFATE (2.5 MG/3ML) 0.083% IN NEBU: 2.5000 mg | INHALATION_SOLUTION | RESPIRATORY_TRACT | Status: DC | PRN

## 2019-06-05 MED ORDER — SENNOSIDES-DOCUSATE SODIUM 8.6-50 MG PO TABS
2.0000 | ORAL_TABLET | Freq: Two times a day (BID) | ORAL | Status: DC
  Filled 2019-06-05 (×3): qty 2

## 2019-06-05 MED ORDER — ASPIRIN EC 81 MG PO TBEC
81.0000 mg | DELAYED_RELEASE_TABLET | Freq: Every day | ORAL | Status: DC
  Administered 2019-06-05 – 2019-06-06 (×2): 81 mg via ORAL
  Filled 2019-06-05 (×3): qty 1

## 2019-06-05 MED ORDER — METHOCARBAMOL 500 MG PO TABS
750.0000 mg | ORAL_TABLET | Freq: Three times a day (TID) | ORAL | Status: DC | PRN
  Administered 2019-06-05: 750 mg via ORAL
  Filled 2019-06-05: qty 2

## 2019-06-05 NOTE — Evaluation (Signed)
Occupational Therapy Evaluation Patient Details Name: Thomas Robinson MRN: [id number] DOB: Jul 18, 1961 Today's Date: 06/05/2019    History of Present Illness Pt is a 58 y/o male with PMH of ESRD-HD (TTS), HTN, asthma, CVA, depression, CHF, anemia, CIB, presenting after a fall and complains of R sided chest, flank and upper abdomen pain. CT reveals R rib 8-9 fx, pulmonary contusion.    Clinical Impression   PTA patient independent and driving. Admitted for above and limited by problem list below, including impaired balance, R rib pain, and decreased safety awareness.  Patient currently requires min guard for transfers and limited in room mobility, min guard for grooming at sink and LB ADLs. On supplemental O2 upon entry (3L) at 93%, removed at EOB and desaturated to 84%, reapplied O2 and required increased to 5L with minimal activity (mobility to sink, brushing teeth). After education, pt continues to request to walk without oxygen in room. Educated on use of IS, encouraged use 10x/hr. He will benefit from continued OT services while admitted in order to maximize independence and safety with ADLs, but anticipate no further needs after dc.     Follow Up Recommendations  No OT follow up;Supervision - Intermittent    Equipment Recommendations  None recommended by OT    Recommendations for Other Services PT consult     Precautions / Restrictions Precautions Precautions: Fall Restrictions Weight Bearing Restrictions: No      Mobility Bed Mobility Overal bed mobility: Modified Independent             General bed mobility comments: increased time, HOB elevated, no physical assist but educated on bracing ribs with pillow  Transfers Overall transfer level: Needs assistance Equipment used: None Transfers: Sit to/from Stand Sit to Stand: Min guard         General transfer comment: min guard for safety and balance    Balance Overall balance assessment: Needs  assistance Sitting-balance support: No upper extremity supported;Feet supported Sitting balance-Leahy Scale: Good     Standing balance support: No upper extremity supported;During functional activity Standing balance-Leahy Scale: Fair Standing balance comment: min guard for safety/balance, preference to UE support                           ADL either performed or assessed with clinical judgement   ADL Overall ADL's : Needs assistance/impaired     Grooming: Min guard;Standing   Upper Body Bathing: Supervision/ safety;Set up;Sitting   Lower Body Bathing: Min guard;Sit to/from stand   Upper Body Dressing : Set up;Sitting   Lower Body Dressing: Min guard;Sit to/from stand   Toilet Transfer: Min Pharmacologist Details (indicate cue type and reason): simulated in room Toileting- Water quality scientist and Hygiene: Min guard;Sit to/from stand       Functional mobility during ADLs: Min guard General ADL Comments: pt limited by pain, SpO2, and impaired balance     Vision   Vision Assessment?: No apparent visual deficits     Perception     Praxis      Pertinent Vitals/Pain Pain Assessment: Faces Faces Pain Scale: Hurts little more Pain Location: R flank,ribs Pain Descriptors / Indicators: Discomfort;Aching;Sharp Pain Intervention(s): Monitored during session;Repositioned     Hand Dominance Right   Extremity/Trunk Assessment Upper Extremity Assessment Upper Extremity Assessment: Overall WFL for tasks assessed   Lower Extremity Assessment Lower Extremity Assessment: Defer to PT evaluation   Cervical / Trunk Assessment Cervical / Trunk Assessment: Normal  Communication Communication Communication: No difficulties   Cognition Arousal/Alertness: Awake/alert Behavior During Therapy: WFL for tasks assessed/performed Overall Cognitive Status: Impaired/Different from baseline Area of Impairment:  Safety/judgement;Awareness;Problem solving                         Safety/Judgement: Decreased awareness of safety;Decreased awareness of deficits Awareness: Emergent Problem Solving: Requires verbal cues General Comments: pt oriented and pleasant, but poor awareness of deficits and safety as he wanted to take his oxgyen off (after education of de saturation) and just walk    General Comments  pt on 3L supplemental O2 upon entry with O2 at 93% at rest, RA at EOB dropped to 84% and required increase to 5L to maintain 89-90% SpO2 with minimal activity at this sink     Exercises     Shoulder Instructions      Home Living Family/patient expects to be discharged to:: Private residence Living Arrangements: Other relatives(aunt) Available Help at Discharge: Family;Available 24 hours/day Type of Home: House Home Access: Stairs to enter [employer] of Steps: 2 Entrance Stairs-Rails: None Home Layout: One level     Bathroom Shower/Tub: Teacher, early years/pre: Standard     Home Equipment: None          Prior Functioning/Environment Level of Independence: Independent        Comments: independent ADLs, IADLs, driving; retired         OT Problem List: Decreased activity tolerance;Impaired balance (sitting and/or standing);Decreased knowledge of use of DME or AE;Pain;Decreased safety awareness;Impaired sensation;Cardiopulmonary status limiting activity      OT Treatment/Interventions: Self-care/ADL training;DME and/or AE instruction;Therapeutic activities;Balance training;Therapeutic exercise;Patient/family education;Energy conservation    OT Goals(Current goals can be found in the care plan section) Acute Rehab OT Goals Patient Stated Goal: go home OT Goal Formulation: With patient Time For Goal Achievement: 06/19/19 Potential to Achieve Goals: Good  OT Frequency: Min 2X/week   Barriers to D/C:            Co-evaluation               AM-PAC OT "6 Clicks" Daily Activity     Outcome Measure Help from another person eating meals?: None Help from another person taking care of personal grooming?: A Little Help from another person toileting, which includes using toliet, bedpan, or urinal?: A Little Help from another person bathing (including washing, rinsing, drying)?: A Little Help from another person to put on and taking off regular upper body clothing?: A Little Help from another person to put on and taking off regular lower body clothing?: A Little 6 Click Score: 19   End of Session Equipment Utilized During Treatment: Oxygen Nurse Communication: Mobility status;Other (comment)(O2)  Activity Tolerance: Patient tolerated treatment well Patient left: in bed;with call bell/phone within reach;with bed alarm set  OT Visit Diagnosis: Repeated falls (R29.6);Unsteadiness on feet (R26.81);Pain Pain - Right/Left: Right Pain - part of body: (flank, ribs)                Time: 6979-4801 OT Time Calculation (min): 25 min Charges:  OT General Charges $OT Visit: 1 Visit OT Evaluation $OT Eval Moderate Complexity: 1 Mod OT Treatments $Self Care/Home Management : 8-22 mins  Delight Stare, OT Acute Rehabilitation Services Pager [phone] Office [phone]   Delight Stare 06/05/2019, 9:37 AM

## 2019-06-05 NOTE — Progress Notes (Signed)
Patient ID: Thomas Robinson, male   DOB: August 03, 1961, 58 y.o.   MRN: [id number]     Subjective: C/O some rib pain on R  Objective: Vital signs in last 24 hours: Temp:  [98 F (36.7 C)-98.5 F (36.9 C)] 98.4 F (36.9 C) (10/04 0930) Pulse Rate:  [54-73] 66 (10/04 0930) Resp:  [13-23] 16 (10/04 0930) BP: (128-159)/(54-123) 128/60 (10/04 0930) SpO2:  [85 %-97 %] 95 % (10/04 0930) Weight:  [61.8 kg] 61.8 kg (10/04 0146) Last BM Date: 06/03/19  Intake/Output from previous day: No intake/output data recorded. Intake/Output this shift: Total I/O In: 200 [P.O.:200] Out: 0   General appearance: cooperative Resp: clear to auscultation bilaterally Chest wall: right sided chest wall tenderness Cardio: regular rate and rhythm GI: soft, NT  Lab Results: CBC  Recent Labs    06/04/19 1836  WBC 5.6  HGB 9.6*  HCT 29.5*  PLT 149*   BMET Recent Labs    06/04/19 1836  NA 137  K 5.0  CL 96*  CO2 25  GLUCOSE 104*  BUN 27*  CREATININE 5.78*  CALCIUM 8.7*   PT/INR No results for input(s): LABPROT, INR in the last 72 hours. ABG No results for input(s): PHART, HCO3 in the last 72 hours.  Invalid input(s): PCO2, PO2  Studies/Results: Ct Abdomen Pelvis Wo Contrast  Result Date: 06/04/2019 CLINICAL DATA:  Trip and fall with right sided injury against the corner of [place]. Right sided pain. Back pain. EXAM: CT CHEST, ABDOMEN AND PELVIS WITHOUT CONTRAST TECHNIQUE: Multidetector CT imaging of the chest, abdomen and pelvis was performed following the standard protocol without IV contrast. COMPARISON:  Chest radiograph from earlier today. FINDINGS: CT CHEST FINDINGS Cardiovascular: Mild cardiomegaly. No significant pericardial effusion/thickening. Left anterior descending and left circumflex coronary atherosclerosis. Atherosclerotic thoracic aorta with 4.5 cm ascending thoracic aortic aneurysm. No evidence of acute intramural hematoma in the thoracic aorta. Top-normal caliber main  pulmonary artery (3.2 cm diameter). Mediastinum/Nodes: Hypodense 1.0 cm posterior left thyroid lobe nodule. Unremarkable esophagus. No pathologically enlarged axillary, mediastinal or hilar lymph nodes, noting limited sensitivity for the detection of hilar adenopathy on this noncontrast study. Lungs/Pleura: No pneumothorax. Minimal smooth with basilar left pleural thickening with trace dependent left pleural effusion. No right pleural effusion. Diffuse bronchial wall thickening. Patchy consolidation throughout peripheral basilar right lower lobe. Scattered parenchymal bands in basilar left lower lobe. Mosaic attenuation in both lungs. Mild diffuse interlobular septal thickening. No lung masses or significant pulmonary nodules. No central airway stenoses. Musculoskeletal: No aggressive appearing focal osseous lesions. Nondisplaced acute posterolateral right eighth and posterior right ninth rib fractures. Moderate thoracic spondylosis. CT ABDOMEN PELVIS FINDINGS Hepatobiliary: Normal size liver. No liver mass. No appreciable liver laceration on this noncontrast scan. Normal gallbladder with no radiopaque cholelithiasis. No biliary ductal dilatation. Pancreas: Normal, with no mass or duct dilation. Spleen: Normal size. No mass. Adrenals/Urinary Tract: Normal adrenals. Symmetric severe bilateral renal cortical atrophy. No renal stones. No hydronephrosis. Scattered subcentimeter hypodense right renal cortical lesions are too small to characterize and require no follow-up. No contour deforming left renal lesions. Normal bladder. Stomach/Bowel: Normal non-distended stomach. Normal caliber small bowel with no small bowel wall thickening. Normal appendix. Normal large bowel with no diverticulosis, large bowel wall thickening or pericolonic fat stranding. Vascular/Lymphatic: Atherosclerotic nonaneurysmal abdominal aorta. No pathologically enlarged lymph nodes in the abdomen or pelvis. Reproductive: Top-normal size prostate.  Other: No pneumoperitoneum, ascites or focal fluid collection. Small fat containing umbilical hernia. Musculoskeletal: No aggressive appearing focal  osseous lesions. No fracture in the abdomen or pelvis. Moderate lumbar spondylosis. IMPRESSION: 1. Acute posterior right eighth and ninth rib fractures. No pneumothorax. 2. Patchy consolidation in the peripheral right lower lobe, compatible with pulmonary contusion. 3. Mild cardiomegaly.  Two-vessel coronary atherosclerosis. 4. Mild diffuse interlobular septal thickening, suggesting mild pulmonary edema. 5. Mosaic attenuation throughout both lungs, a nonspecific finding that can be due to air trapping from small airways disease or mosaic perfusion from pulmonary vascular disease. 6. Ascending thoracic aortic 4.5 cm aneurysm. Ascending thoracic aortic aneurysm. Recommend semi-annual imaging followup by CTA or MRA and referral to cardiothoracic surgery if not already obtained. This recommendation follows 2010 ACCF/AHA/AATS/ACR/ASA/SCA/SCAI/SIR/STS/SVM Guidelines for the Diagnosis and Management of Patients With Thoracic Aortic Disease. Circulation. 2010; 121: D408-X448. Aortic aneurysm NOS (ICD10-I71.9). 7. No evidence of acute traumatic injury in the abdomen or pelvis on this noncontrast scan. Electronically Signed   By: Ilona Sorrel M.D.   On: 06/04/2019 17:25   Dg Chest 2 View  Result Date: 06/04/2019 CLINICAL DATA:  Trip and fall, right-sided rib pain EXAM: CHEST - 2 VIEW COMPARISON:  05/16/2019 FINDINGS: Cardiomegaly. Pulmonary vascular prominence without overt edema. There is a minimally displaced fracture of the posterior right sixth rib. IMPRESSION: 1. There is a minimally displaced fracture of the posterior right sixth rib. No pneumothorax. 2. Cardiomegaly and pulmonary vascular prominence without overt pulmonary edema. No focal airspace opacity. Electronically Signed   By: Eddie Candle M.D.   On: 06/04/2019 16:07   Ct Chest Wo Contrast  Result Date:  06/04/2019 CLINICAL DATA:  Trip and fall with right sided injury against the corner of [place]. Right sided pain. Back pain. EXAM: CT CHEST, ABDOMEN AND PELVIS WITHOUT CONTRAST TECHNIQUE: Multidetector CT imaging of the chest, abdomen and pelvis was performed following the standard protocol without IV contrast. COMPARISON:  Chest radiograph from earlier today. FINDINGS: CT CHEST FINDINGS Cardiovascular: Mild cardiomegaly. No significant pericardial effusion/thickening. Left anterior descending and left circumflex coronary atherosclerosis. Atherosclerotic thoracic aorta with 4.5 cm ascending thoracic aortic aneurysm. No evidence of acute intramural hematoma in the thoracic aorta. Top-normal caliber main pulmonary artery (3.2 cm diameter). Mediastinum/Nodes: Hypodense 1.0 cm posterior left thyroid lobe nodule. Unremarkable esophagus. No pathologically enlarged axillary, mediastinal or hilar lymph nodes, noting limited sensitivity for the detection of hilar adenopathy on this noncontrast study. Lungs/Pleura: No pneumothorax. Minimal smooth with basilar left pleural thickening with trace dependent left pleural effusion. No right pleural effusion. Diffuse bronchial wall thickening. Patchy consolidation throughout peripheral basilar right lower lobe. Scattered parenchymal bands in basilar left lower lobe. Mosaic attenuation in both lungs. Mild diffuse interlobular septal thickening. No lung masses or significant pulmonary nodules. No central airway stenoses. Musculoskeletal: No aggressive appearing focal osseous lesions. Nondisplaced acute posterolateral right eighth and posterior right ninth rib fractures. Moderate thoracic spondylosis. CT ABDOMEN PELVIS FINDINGS Hepatobiliary: Normal size liver. No liver mass. No appreciable liver laceration on this noncontrast scan. Normal gallbladder with no radiopaque cholelithiasis. No biliary ductal dilatation. Pancreas: Normal, with no mass or duct dilation. Spleen: Normal  size. No mass. Adrenals/Urinary Tract: Normal adrenals. Symmetric severe bilateral renal cortical atrophy. No renal stones. No hydronephrosis. Scattered subcentimeter hypodense right renal cortical lesions are too small to characterize and require no follow-up. No contour deforming left renal lesions. Normal bladder. Stomach/Bowel: Normal non-distended stomach. Normal caliber small bowel with no small bowel wall thickening. Normal appendix. Normal large bowel with no diverticulosis, large bowel wall thickening or pericolonic fat stranding. Vascular/Lymphatic: Atherosclerotic nonaneurysmal abdominal aorta.  No pathologically enlarged lymph nodes in the abdomen or pelvis. Reproductive: Top-normal size prostate. Other: No pneumoperitoneum, ascites or focal fluid collection. Small fat containing umbilical hernia. Musculoskeletal: No aggressive appearing focal osseous lesions. No fracture in the abdomen or pelvis. Moderate lumbar spondylosis. IMPRESSION: 1. Acute posterior right eighth and ninth rib fractures. No pneumothorax. 2. Patchy consolidation in the peripheral right lower lobe, compatible with pulmonary contusion. 3. Mild cardiomegaly.  Two-vessel coronary atherosclerosis. 4. Mild diffuse interlobular septal thickening, suggesting mild pulmonary edema. 5. Mosaic attenuation throughout both lungs, a nonspecific finding that can be due to air trapping from small airways disease or mosaic perfusion from pulmonary vascular disease. 6. Ascending thoracic aortic 4.5 cm aneurysm. Ascending thoracic aortic aneurysm. Recommend semi-annual imaging followup by CTA or MRA and referral to cardiothoracic surgery if not already obtained. This recommendation follows 2010 ACCF/AHA/AATS/ACR/ASA/SCA/SCAI/SIR/STS/SVM Guidelines for the Diagnosis and Management of Patients With Thoracic Aortic Disease. Circulation. 2010; 121: A630-Z601. Aortic aneurysm NOS (ICD10-I71.9). 7. No evidence of acute traumatic injury in the abdomen or  pelvis on this noncontrast scan. Electronically Signed   By: Ilona Sorrel M.D.   On: 06/04/2019 17:25   Dg Chest Port 1 View  Result Date: 06/05/2019 CLINICAL DATA:  Contusion EXAM: PORTABLE CHEST 1 VIEW COMPARISON:  Chest radiograph from one day prior. FINDINGS: Stable cardiomediastinal silhouette with top-normal heart size. No pneumothorax. No pleural effusion. Patchy opacity at the right lung base is slightly increased. Stable curvilinear left lung base opacities. No pulmonary edema. IMPRESSION: 1. No pneumothorax. 2. Patchy right lung base opacity, slightly increased, favor a combination of contusion and atelectasis. 3. Stable left lung base scarring versus atelectasis. Electronically Signed   By: Ilona Sorrel M.D.   On: 06/05/2019 10:36    Anti-infectives: Anti-infectives (From admission, onward)   None      Assessment/Plan: Fall R rib FX 8-9 - CXR today no PTX, pulm toilet, I added Robaxin PRN, he did 750 on IS ESRD - per Renal     LOS: 0 days    Georganna Skeans, MD, MPH, FACS Trauma & General Surgery Use [email or web address] to contact on call provider  06/05/2019

## 2019-06-05 NOTE — Progress Notes (Signed)
Patient  refused bed a;arm. This RN explained the risks and educated the patient.

## 2019-06-05 NOTE — Evaluation (Signed)
Physical Therapy Evaluation Patient Details Name: Thomas Robinson MRN: [id number] DOB: 07-24-1961 Today's Date: 06/05/2019   History of Present Illness  Pt is a 58 y/o male with PMH of ESRD-HD (TTS), HTN, asthma, CVA, depression, CHF, anemia, CIB, presenting after a fall and complains of R sided chest, flank and upper abdomen pain. CT reveals R rib 8-9 fx, pulmonary contusion.   Clinical Impression   Pt admitted with above diagnosis. Reports independent prior to admission; review of PT notes form recent admission shows history of falls; Presents with significant fall risk, O2 sat drop with activity, pain with deep breaths; Noting slight steppage gait on the L, residual from previous stroke; worth considering L AFO to help decr fall risk;  Pt currently with functional limitations due to the deficits listed below (see PT Problem List). Pt will benefit from skilled PT to increase their independence and safety with mobility to allow discharge to the venue listed below.       Follow Up Recommendations Home health PT;Other (comment)(Worth considering [facility])    Equipment Recommendations  Rolling walker with 5" wheels    Recommendations for Other Services       Precautions / Restrictions Precautions Precautions: Fall Precaution Comments: has fallen at home, alcohol has been related to some of his falls but not all      Mobility  Bed Mobility Overal bed mobility: Needs Assistance Bed Mobility: Supine to Sit     Supine to sit: Supervision     General bed mobility comments: increased time, HOB elevated, no physical assist but educated on bracing ribs with pillow  Transfers Overall transfer level: Needs assistance Equipment used: None Transfers: Sit to/from Stand Sit to Stand: Min guard         General transfer comment: min guard for safety and balance  Ambulation/Gait Ambulation/Gait assistance: Min assist;Min guard;Mod assist Gait Distance (Feet): 100  Feet Assistive device: None(and pushing vitals machine) Gait Pattern/deviations: Step-through pattern;Wide base of support;Staggering left     General Gait Details: Noted decr heel strike L, associated with decr dorsiflexion L with swing; one major loss of balance requiring mod assist to prevent fall  Stairs            Wheelchair Mobility    Modified Rankin (Stroke Patients Only)       Balance               Standing balance comment: min guard for safety/balance, preference to UE support                             Pertinent Vitals/Pain Pain Assessment: Faces Faces Pain Scale: Hurts even more Pain Location: R flank,ribs Pain Descriptors / Indicators: Discomfort;Aching;Sharp Pain Intervention(s): Monitored during session    Home Living Family/patient expects to be discharged to:: Private residence Living Arrangements: Other relatives(aunt) Available Help at Discharge: Family;Available 24 hours/day Type of Home: House Home Access: Stairs to enter Entrance Stairs-Rails: None Entrance Stairs-Number of Steps: 2 Home Layout: One level Home Equipment: None      Prior Function Level of Independence: Independent         Comments: independent ADLs, IADLs, driving; retired      Engineer, manufacturing Dominance   Dominant Hand: Right    Extremity/Trunk Assessment   Upper Extremity Assessment Upper Extremity Assessment: Defer to OT evaluation    Lower Extremity Assessment Lower Extremity Assessment: Generalized weakness LLE Deficits / Details: hip flex 3+/5,  knee ext 3+/5, knee flex 3+/5, increased tone noted LLE LLE Sensation: decreased proprioception LLE Coordination: decreased gross motor       Communication   Communication: No difficulties  Cognition Arousal/Alertness: Awake/alert Behavior During Therapy: WFL for tasks assessed/performed Overall Cognitive Status: Impaired/Different from baseline Area of Impairment: Safety/judgement;Awareness;Problem  solving                         Safety/Judgement: Decreased awareness of safety;Decreased awareness of deficits Awareness: Emergent Problem Solving: Requires verbal cues        General Comments General comments (skin integrity, edema, etc.): See other PT note of this date for O2 sat walk numbers    Exercises     Assessment/Plan    PT Assessment Patient needs continued PT services  PT Problem List Decreased strength;Decreased balance;Decreased mobility;Decreased coordination;Decreased cognition;Decreased knowledge of use of DME;Decreased safety awareness;Decreased knowledge of precautions;Pain;Decreased activity tolerance;Impaired tone;Impaired sensation;Cardiopulmonary status limiting activity       PT Treatment Interventions DME instruction;Gait training;Stair training;Functional mobility training;Therapeutic activities;Therapeutic exercise;Neuromuscular re-education;Balance training;Patient/family education;Cognitive remediation    PT Goals (Current goals can be found in the Care Plan section)  Acute Rehab PT Goals Patient Stated Goal: lay back down for a nap PT Goal Formulation: With patient Time For Goal Achievement: 06/19/19 Potential to Achieve Goals: Fair    Frequency Min 3X/week   Barriers to discharge        Co-evaluation               AM-PAC PT "6 Clicks" Mobility  Outcome Measure Help needed turning from your back to your side while in a flat bed without using bedrails?: None Help needed moving from lying on your back to sitting on the side of a flat bed without using bedrails?: A Little Help needed moving to and from a bed to a chair (including a wheelchair)?: A Little Help needed standing up from a chair using your arms (e.g., wheelchair or bedside chair)?: A Little Help needed to walk in hospital room?: A Little Help needed climbing 3-5 steps with a railing? : A Little 6 Click Score: 19    End of Session Equipment Utilized During  Treatment: Gait belt Activity Tolerance: Patient tolerated treatment well Patient left: in bed;with call bell/phone within reach;with bed alarm set Nurse Communication: Mobility status PT Visit Diagnosis: Unsteadiness on feet (R26.81);Repeated falls (R29.6);Difficulty in walking, not elsewhere classified (R26.2)    Time: 3557-3220 PT Time Calculation (min) (ACUTE ONLY): 20 min   Charges:   PT Evaluation $PT Eval Moderate Complexity: 1 Mod          Roney Marion, PT  Acute Rehabilitation Services Pager [phone] Office [phone]   [place]ta [place] 06/05/2019, 2:27 PM

## 2019-06-05 NOTE — ED Notes (Signed)
Paperwork printed and [facility] contacted 

## 2019-06-05 NOTE — Progress Notes (Addendum)
PROGRESS NOTE  Thomas Robinson OAC:[id number] DOB: 11-27-1960 DOA: 06/04/2019 PCP: Charlott Rakes, MD  HPI/Recap of past 24 hours: Thomas Robinson is a 58 y.o. male with medical history significant of ESRD-HD (TTS), HTN, asthma, stroke, GERD, depression, CHF, anemia, GIB, who presents with fall.  Pt states that he fell when he was walking in his bedroom, tripped, fell, hit the right side of his chest on the box spring of the bed. He developed pain in the right side of the chest, right flank and right upper abdomen.  The pain is a sharp, 10 out of 10 severity, pleuritic, nonradiating.  He does not have loss consciousness.  Strongly denies head injury or neck injury.  No headache or neck pain.  Patient has some mild shortness of breath, no cough, fever or chills. Patient does not have nausea, vomiting, diarrhea.  No symptoms of UTI. He had his last episode of dialysis today but dialyze for 3 hours because he stopped early because of cramping.  ED Course: pt was found to have WBC 5.6, stable hemoglobin, negative COVID-19 test, potassium 5.0, bicarbonate 25, creatinine 5.78, BUN 27, temperature normal, oxygen saturation 85% on room air, blood pressure 143/91, heart rate 67, tachypnea. Pt is placed on telemetry bed for observation.  CT of chest and abdomen/pelvis showed right rib fracture (eighth and ninth), pulmonary contusion.  General surgeon, Dr. Kieth Brightly was consulted.  CT of abd/pelvis and Chest: 1. Aute posterior right eighth and ninth rib fractures. No pneumothorax. 2. Patchy consolidation in the peripheral right lower lobe, compatible with pulmonary contusion. 3. Mild cardiomegaly. Two-vessel coronary atherosclerosis. 4. Mild diffuse interlobular septal thickening, suggesting mild pulmonary edema. 5. Mosaic attenuation throughout both lungs, a nonspecific finding that can be due to air trapping from small airways disease or mosaic perfusion from pulmonary vascular disease. 6.  Ascending thoracic aortic 4.5 cm aneurysm. Ascending thoracic aortic aneurysm.   06/05/19: Patient was seen and examined at his bedside this morning.  No acute events overnight.  Normal oxygen supplementation at baseline currently on 3 L with oxygen saturation of 95%.  OT assessed with no further recommendation.  Obtain home O2 evaluation.  Obtain repeat chest x-ray this afternoon to rule out pneumothorax.  Continue to closely monitor on telemetry and continuous pulse oximetry.   Assessment/Plan: Principal Problem:   Fall at home, initial encounter Active Problems:   Essential hypertension   ESRD on dialysis [facility])   Right rib fracture   Lung contusion   Acute respiratory failure with hypoxia (HCC)   Anemia in ESRD (end-stage renal disease) (HCC)   GERD (gastroesophageal reflux disease)   Chronic diastolic (congestive) heart failure ([place])   Fall   Thoracic ascending aortic aneurysm ([place])   Right eighth and ninth rib fractures complicated by right pulmonary contusion, POA Independently reviewed CT chest done on admission which showed right eighth and ninth rib fracture and pulmonary contusion Currently on 3 L of oxygen by nasal cannula saturating 95% Not on O2 supplementation at home Obtain home O2 evaluation Repeat chest x-ray at 1300 DC IV pain medications; avoid morphine in ESRD patients On oxycodone as needed for severe pain Start bowel regimen to avoid opiate-induced constipation  Acute hypoxic respiratory failure likely related to chest wall pain from rib fractures Not on oxygen supplementation at baseline Currently on 3 L with O2 saturation 95% Wean off oxygen as tolerated Home O2 evaluation on 06/05/2019. Continue continuous pulse oximetry Repeat chest x-ray this afternoon at 1300 to rule out  pneumothorax  Thoracic aortic aneurysm measuring 4.5 cm No acute issues Recommend surveillance with repeat imaging outpatient. Blood pressure goal normotensive  ESRD on HD  TTS Last HD on Saturday, 06/04/2019 Nephrology consult if prolonged hospitalization  Asthma Stable Continue nebs  Essential hypertension Blood pressure is at goal Continue home Norvasc 10 mg daily, Coreg 12.5 mg twice daily, p.o. hydralazine 100 mg 3 times daily Continue to closely monitor vital signs  GERD Continue Protonix daily  Chronic diastolic CHF 2D echo done on 05/17/2019 showed LVEF greater than 65 with grade 1 diastolic dysfunction Volume managed by hemodialysis  Anemia of chronic disease in the setting of ESRD Remaining stable Continue to monitor    DVT ppx: SCD Code Status: Full code Family Communication: None at bed side Disposition Plan:  Anticipate discharge back to previous home environment Consults called:  Dr. Kieth Brightly of general surgeon    Objective: Vitals:   06/05/19 0603 06/05/19 0759 06/05/19 0833 06/05/19 0930  BP: (!) 133/54   128/60  Pulse: (!) 54 65  66  Resp: 14   16  Temp:    98.4 F (36.9 C)  TempSrc:    Oral  SpO2: 97%  94% 95%  Weight:      Height:        Intake/Output Summary (Last 24 hours) at 06/05/2019 1005 Last data filed at 06/05/2019 0801 Gross per 24 hour  Intake 200 ml  Output 0 ml  Net 200 ml   Filed Weights   06/05/19 0146  Weight: 61.8 kg    Exam:   General: 58 y.o. year-old male well developed well nourished in no acute distress.  Alert and oriented x3.  Cardiovascular: Regular rate and rhythm with no rubs or gallops.  No thyromegaly or JVD noted.    Respiratory: Clear to auscultation with no wheezes or rales. Good inspiratory effort.  Abdomen: Soft nontender nondistended with normal bowel sounds x4 quadrants.  Musculoskeletal: No lower extremity edema. 2/4 pulses in all 4 extremities.  Psychiatry: Mood is appropriate for condition and setting   Data Reviewed: CBC: Recent Labs  Lab 06/04/19 1836  WBC 5.6  NEUTROABS 4.5  HGB 9.6*  HCT 29.5*  MCV 112.2*  PLT 299*   Basic Metabolic  Panel: Recent Labs  Lab 06/04/19 1836  NA 137  K 5.0  CL 96*  CO2 25  GLUCOSE 104*  BUN 27*  CREATININE 5.78*  CALCIUM 8.7*   GFR: Estimated Creatinine Clearance: 11.9 mL/min (A) (by C-G formula based on SCr of 5.78 mg/dL (H)). Liver Function Tests: No results for input(s): AST, ALT, ALKPHOS, BILITOT, PROT, ALBUMIN in the last 168 hours. No results for input(s): LIPASE, AMYLASE in the last 168 hours. No results for input(s): AMMONIA in the last 168 hours. Coagulation Profile: No results for input(s): INR, PROTIME in the last 168 hours. Cardiac Enzymes: No results for input(s): CKTOTAL, CKMB, CKMBINDEX, TROPONINI in the last 168 hours. BNP (last 3 results) No results for input(s): PROBNP in the last 8760 hours. HbA1C: No results for input(s): HGBA1C in the last 72 hours. CBG: No results for input(s): GLUCAP in the last 168 hours. Lipid Profile: No results for input(s): CHOL, HDL, LDLCALC, TRIG, CHOLHDL, LDLDIRECT in the last 72 hours. Thyroid Function Tests: No results for input(s): TSH, T4TOTAL, FREET4, T3FREE, THYROIDAB in the last 72 hours. Anemia Panel: No results for input(s): VITAMINB12, FOLATE, FERRITIN, TIBC, IRON, RETICCTPCT in the last 72 hours. Urine analysis:    Component Value Date/Time  COLORURINE YELLOW 05/24/2017 1729   APPEARANCEUR HAZY (A) 05/24/2017 1729   LABSPEC 1.011 05/24/2017 1729   PHURINE 5.0 05/24/2017 1729   GLUCOSEU 50 (A) 05/24/2017 1729   HGBUR SMALL (A) 05/24/2017 1729   BILIRUBINUR NEGATIVE 05/24/2017 1729   KETONESUR NEGATIVE 05/24/2017 1729   PROTEINUR 100 (A) 05/24/2017 1729   UROBILINOGEN 0.2 04/09/2015 1527   NITRITE NEGATIVE 05/24/2017 1729   LEUKOCYTESUR TRACE (A) 05/24/2017 1729   Sepsis Labs: @LABRCNTIP (procalcitonin:4,lacticidven:4)  ) Recent Results (from the past 240 hour(s))  SARS Coronavirus 2 [facility] order, Performed in [facility] hospital lab) Nasopharyngeal Nasopharyngeal Swab     Status: None   Collection  Time: 06/04/19  5:58 PM   Specimen: Nasopharyngeal Swab  Result Value Ref Range Status   SARS Coronavirus 2 NEGATIVE NEGATIVE Final    Comment: (NOTE) If result is NEGATIVE SARS-CoV-2 target nucleic acids are NOT DETECTED. The SARS-CoV-2 RNA is generally detectable in upper and lower  respiratory specimens during the acute phase of infection. The lowest  concentration of SARS-CoV-2 viral copies this assay can detect is 250  copies / mL. A negative result does not preclude SARS-CoV-2 infection  and should not be used as the sole basis for treatment or other  patient management decisions.  A negative result may occur with  improper specimen collection / handling, submission of specimen other  than nasopharyngeal swab, presence of viral mutation(s) within the  areas targeted by this assay, and inadequate number of viral copies  (<250 copies / mL). A negative result must be combined with clinical  observations, patient history, and epidemiological information. If result is POSITIVE SARS-CoV-2 target nucleic acids are DETECTED. The SARS-CoV-2 RNA is generally detectable in upper and lower  respiratory specimens dur ing the acute phase of infection.  Positive  results are indicative of active infection with SARS-CoV-2.  Clinical  correlation with patient history and other diagnostic information is  necessary to determine patient infection status.  Positive results do  not rule out bacterial infection or co-infection with other viruses. If result is PRESUMPTIVE POSTIVE SARS-CoV-2 nucleic acids MAY BE PRESENT.   A presumptive positive result was obtained on the submitted specimen  and confirmed on repeat testing.  While 2019 novel coronavirus  (SARS-CoV-2) nucleic acids may be present in the submitted sample  additional confirmatory testing may be necessary for epidemiological  and / or clinical management purposes  to differentiate between  SARS-CoV-2 and other Sarbecovirus currently known  to infect humans.  If clinically indicated additional testing with an alternate test  methodology [phone]) is advised. The SARS-CoV-2 RNA is generally  detectable in upper and lower respiratory sp ecimens during the acute  phase of infection. The expected result is Negative. Fact Sheet for Patients:  StrictlyIdeas.no Fact Sheet for Healthcare Providers: BankingDealers.co.za This test is not yet approved or cleared by the [place] FDA and has been authorized for detection and/or diagnosis of SARS-CoV-2 by FDA under an Emergency Use Authorization (EUA).  This EUA will remain in effect (meaning this test can be used) for the duration of the COVID-19 declaration under Section 564(b)(1) of the Act, 21 U.S.C. section 360bbb-3(b)(1), unless the authorization is terminated or revoked sooner. Performed at [facility], [place] [address] [place], [place] [address]       Studies: Ct Abdomen Pelvis Wo Contrast  Result Date: 06/04/2019 CLINICAL DATA:  Trip and fall with right sided injury against the corner of box spring. Right sided pain. Back pain. EXAM:  CT CHEST, ABDOMEN AND PELVIS WITHOUT CONTRAST TECHNIQUE: Multidetector CT imaging of the chest, abdomen and pelvis was performed following the standard protocol without IV contrast. COMPARISON:  Chest radiograph from earlier today. FINDINGS: CT CHEST FINDINGS Cardiovascular: Mild cardiomegaly. No significant pericardial effusion/thickening. Left anterior descending and left circumflex coronary atherosclerosis. Atherosclerotic thoracic aorta with 4.5 cm ascending thoracic aortic aneurysm. No evidence of acute intramural hematoma in the thoracic aorta. Top-normal caliber main pulmonary artery (3.2 cm diameter). Mediastinum/Nodes: Hypodense 1.0 cm posterior left thyroid lobe nodule. Unremarkable esophagus. No pathologically enlarged axillary, mediastinal or hilar lymph nodes, noting  limited sensitivity for the detection of hilar adenopathy on this noncontrast study. Lungs/Pleura: No pneumothorax. Minimal smooth with basilar left pleural thickening with trace dependent left pleural effusion. No right pleural effusion. Diffuse bronchial wall thickening. Patchy consolidation throughout peripheral basilar right lower lobe. Scattered parenchymal bands in basilar left lower lobe. Mosaic attenuation in both lungs. Mild diffuse interlobular septal thickening. No lung masses or significant pulmonary nodules. No central airway stenoses. Musculoskeletal: No aggressive appearing focal osseous lesions. Nondisplaced acute posterolateral right eighth and posterior right ninth rib fractures. Moderate thoracic spondylosis. CT ABDOMEN PELVIS FINDINGS Hepatobiliary: Normal size liver. No liver mass. No appreciable liver laceration on this noncontrast scan. Normal gallbladder with no radiopaque cholelithiasis. No biliary ductal dilatation. Pancreas: Normal, with no mass or duct dilation. Spleen: Normal size. No mass. Adrenals/Urinary Tract: Normal adrenals. Symmetric severe bilateral renal cortical atrophy. No renal stones. No hydronephrosis. Scattered subcentimeter hypodense right renal cortical lesions are too small to characterize and require no follow-up. No contour deforming left renal lesions. Normal bladder. Stomach/Bowel: Normal non-distended stomach. Normal caliber small bowel with no small bowel wall thickening. Normal appendix. Normal large bowel with no diverticulosis, large bowel wall thickening or pericolonic fat stranding. Vascular/Lymphatic: Atherosclerotic nonaneurysmal abdominal aorta. No pathologically enlarged lymph nodes in the abdomen or pelvis. Reproductive: Top-normal size prostate. Other: No pneumoperitoneum, ascites or focal fluid collection. Small fat containing umbilical hernia. Musculoskeletal: No aggressive appearing focal osseous lesions. No fracture in the abdomen or pelvis.  Moderate lumbar spondylosis. IMPRESSION: 1. Acute posterior right eighth and ninth rib fractures. No pneumothorax. 2. Patchy consolidation in the peripheral right lower lobe, compatible with pulmonary contusion. 3. Mild cardiomegaly.  Two-vessel coronary atherosclerosis. 4. Mild diffuse interlobular septal thickening, suggesting mild pulmonary edema. 5. Mosaic attenuation throughout both lungs, a nonspecific finding that can be due to air trapping from small airways disease or mosaic perfusion from pulmonary vascular disease. 6. Ascending thoracic aortic 4.5 cm aneurysm. Ascending thoracic aortic aneurysm. Recommend semi-annual imaging followup by CTA or MRA and referral to cardiothoracic surgery if not already obtained. This recommendation follows 2010 ACCF/AHA/AATS/ACR/ASA/SCA/SCAI/SIR/STS/SVM Guidelines for the Diagnosis and Management of Patients With Thoracic Aortic Disease. Circulation. 2010; 121: Y850-Y774. Aortic aneurysm NOS (ICD10-I71.9). 7. No evidence of acute traumatic injury in the abdomen or pelvis on this noncontrast scan. Electronically Signed   By: Ilona Sorrel M.D.   On: 06/04/2019 17:25   Dg Chest 2 View  Result Date: 06/04/2019 CLINICAL DATA:  Trip and fall, right-sided rib pain EXAM: CHEST - 2 VIEW COMPARISON:  05/16/2019 FINDINGS: Cardiomegaly. Pulmonary vascular prominence without overt edema. There is a minimally displaced fracture of the posterior right sixth rib. IMPRESSION: 1. There is a minimally displaced fracture of the posterior right sixth rib. No pneumothorax. 2. Cardiomegaly and pulmonary vascular prominence without overt pulmonary edema. No focal airspace opacity. Electronically Signed   By: Eddie Candle M.D.   On: 06/04/2019 16:07  Ct Chest Wo Contrast  Result Date: 06/04/2019 CLINICAL DATA:  Trip and fall with right sided injury against the corner of box spring. Right sided pain. Back pain. EXAM: CT CHEST, ABDOMEN AND PELVIS WITHOUT CONTRAST TECHNIQUE: Multidetector CT  imaging of the chest, abdomen and pelvis was performed following the standard protocol without IV contrast. COMPARISON:  Chest radiograph from earlier today. FINDINGS: CT CHEST FINDINGS Cardiovascular: Mild cardiomegaly. No significant pericardial effusion/thickening. Left anterior descending and left circumflex coronary atherosclerosis. Atherosclerotic thoracic aorta with 4.5 cm ascending thoracic aortic aneurysm. No evidence of acute intramural hematoma in the thoracic aorta. Top-normal caliber main pulmonary artery (3.2 cm diameter). Mediastinum/Nodes: Hypodense 1.0 cm posterior left thyroid lobe nodule. Unremarkable esophagus. No pathologically enlarged axillary, mediastinal or hilar lymph nodes, noting limited sensitivity for the detection of hilar adenopathy on this noncontrast study. Lungs/Pleura: No pneumothorax. Minimal smooth with basilar left pleural thickening with trace dependent left pleural effusion. No right pleural effusion. Diffuse bronchial wall thickening. Patchy consolidation throughout peripheral basilar right lower lobe. Scattered parenchymal bands in basilar left lower lobe. Mosaic attenuation in both lungs. Mild diffuse interlobular septal thickening. No lung masses or significant pulmonary nodules. No central airway stenoses. Musculoskeletal: No aggressive appearing focal osseous lesions. Nondisplaced acute posterolateral right eighth and posterior right ninth rib fractures. Moderate thoracic spondylosis. CT ABDOMEN PELVIS FINDINGS Hepatobiliary: Normal size liver. No liver mass. No appreciable liver laceration on this noncontrast scan. Normal gallbladder with no radiopaque cholelithiasis. No biliary ductal dilatation. Pancreas: Normal, with no mass or duct dilation. Spleen: Normal size. No mass. Adrenals/Urinary Tract: Normal adrenals. Symmetric severe bilateral renal cortical atrophy. No renal stones. No hydronephrosis. Scattered subcentimeter hypodense right renal cortical lesions are  too small to characterize and require no follow-up. No contour deforming left renal lesions. Normal bladder. Stomach/Bowel: Normal non-distended stomach. Normal caliber small bowel with no small bowel wall thickening. Normal appendix. Normal large bowel with no diverticulosis, large bowel wall thickening or pericolonic fat stranding. Vascular/Lymphatic: Atherosclerotic nonaneurysmal abdominal aorta. No pathologically enlarged lymph nodes in the abdomen or pelvis. Reproductive: Top-normal size prostate. Other: No pneumoperitoneum, ascites or focal fluid collection. Small fat containing umbilical hernia. Musculoskeletal: No aggressive appearing focal osseous lesions. No fracture in the abdomen or pelvis. Moderate lumbar spondylosis. IMPRESSION: 1. Acute posterior right eighth and ninth rib fractures. No pneumothorax. 2. Patchy consolidation in the peripheral right lower lobe, compatible with pulmonary contusion. 3. Mild cardiomegaly.  Two-vessel coronary atherosclerosis. 4. Mild diffuse interlobular septal thickening, suggesting mild pulmonary edema. 5. Mosaic attenuation throughout both lungs, a nonspecific finding that can be due to air trapping from small airways disease or mosaic perfusion from pulmonary vascular disease. 6. Ascending thoracic aortic 4.5 cm aneurysm. Ascending thoracic aortic aneurysm. Recommend semi-annual imaging followup by CTA or MRA and referral to cardiothoracic surgery if not already obtained. This recommendation follows 2010 ACCF/AHA/AATS/ACR/ASA/SCA/SCAI/SIR/STS/SVM Guidelines for the Diagnosis and Management of Patients With Thoracic Aortic Disease. Circulation. 2010; 121: N361-W431. Aortic aneurysm NOS (ICD10-I71.9). 7. No evidence of acute traumatic injury in the abdomen or pelvis on this noncontrast scan. Electronically Signed   By: Ilona Sorrel M.D.   On: 06/04/2019 17:25    Scheduled Meds:  amLODipine  10 mg Oral Daily   aspirin EC  81 mg Oral Daily   carvedilol  12.5 mg  Oral BID WC   cinacalcet  30 mg Oral Q supper   gabapentin  300 mg Oral QHS   hydrALAZINE  100 mg Oral Q8H   lidocaine  1  patch Transdermal Q24H   mometasone-formoterol  2 puff Inhalation BID   multivitamin  1 tablet Oral Daily   pantoprazole  40 mg Oral Daily   rOPINIRole  0.5 mg Oral BID   sevelamer carbonate  800 mg Oral TID WC    Continuous Infusions:   LOS: 0 days     Kayleen Memos, MD [facility] Pager [phone]  If 7PM-7AM, please contact night-coverage [email or web address] Password [email or web address] 06/05/2019, 10:05 AM

## 2019-06-05 NOTE — Progress Notes (Signed)
Physical Therapy Note  (full evaluation note to follow)  SATURATION QUALIFICATIONS: (This note is used to comply with regulatory documentation for home oxygen)  Patient Saturations on Room Air at Rest = 87%  Patient Saturations on Room Air while Ambulating = 83%  Patient Saturations on 3 Liters of oxygen while Ambulating = 88-91%  Please briefly explain why patient needs home oxygen: Patient requires supplemental oxygen to maintain oxygen saturations at acceptable, safe levels with physical activity.   Thomas Robinson, [place]  [facility] Pager [phone] Office [phone]

## 2019-06-05 NOTE — Progress Notes (Signed)
New Admission Note:   Arrival Method: [facility] ED via carelink  Mental Orientation: A&Ox4 Telemetry: [address], CCMD notified Assessment: to be completed Skin:refer to flowsheet IV: RFA, SL Pain: 5/10 Tubes: None Safety Measures: Safety Fall Prevention Plan has been discussed  Admission: to be completed 5 [place] Orientation: Patient has been orientated to the room, unit and staff.   Family: none at bedside  Orders to be reviewed and implemented. Will continue to monitor the patient. Call light has been placed within reach and bed alarm has been activated.

## 2019-06-06 MED ORDER — DOXERCALCIFEROL 4 MCG/2ML IV SOLN: 4.0000 ug | INTRAVENOUS | Status: DC

## 2019-06-06 MED ORDER — CHLORHEXIDINE GLUCONATE CLOTH 2 % EX PADS: 6.0000 | MEDICATED_PAD | Freq: Every day | CUTANEOUS | Status: DC

## 2019-06-06 MED ORDER — DARBEPOETIN ALFA 100 MCG/0.5ML IJ SOSY: 100.0000 ug | PREFILLED_SYRINGE | INTRAMUSCULAR | Status: DC

## 2019-06-06 MED ORDER — SEVELAMER CARBONATE 800 MG PO TABS
2400.0000 mg | ORAL_TABLET | Freq: Three times a day (TID) | ORAL | Status: DC
  Administered 2019-06-06 (×2): 2400 mg via ORAL
  Filled 2019-06-06 (×3): qty 3

## 2019-06-06 NOTE — Plan of Care (Signed)
  Problem: Health Behavior/Discharge Planning: Goal: Ability to manage health-related needs will improve Outcome: Progressing   Problem: Activity: Goal: Risk for activity intolerance will decrease Outcome: Progressing   

## 2019-06-06 NOTE — Progress Notes (Signed)
Renal Navigator met with patient to offer support and discuss HD at OP HD clinic if discharge falls on a dialysis day. Patient eager to go home and seems understanding of plan explained by Renal Navigator. Navigator to follow closely.   Alphonzo Cruise, [place] Renal Navigator [phone]

## 2019-06-06 NOTE — Progress Notes (Signed)
Patient ID: Thomas Robinson, male   DOB: 1960-09-26, 58 y.o.   MRN: [id number]       Subjective: Wanting to go home.  States he has to go home today.  Doesn't wear O2 at home.  sats are only around 90% on 3-4L of [place].  Pulls between 1000-1250 on IS.  States he is up walking around great.  Objective: Vital signs in last 24 hours: Temp:  [98.3 F (36.8 C)-98.8 F (37.1 C)] 98.3 F (36.8 C) (10/05 0839) Pulse Rate:  [61-66] 66 (10/05 0839) Resp:  [16] 16 (10/05 0839) BP: (127-148)/(59-69) 148/69 (10/05 0839) SpO2:  [92 %-97 %] 92 % (10/05 0839) Last BM Date: 06/03/19  Intake/Output from previous day: 10/04 0701 - 10/05 0700 In: 720 [P.O.:720] Out: 0  Intake/Output this shift: No intake/output data recorded.  PE: Gen: NAD Heart: regular Lungs: CTAB, right lower lateral chest soreness, 3L [place] with sats between 88-92% Abd: soft, NT, ND, +BS Ext: MAE  Lab Results:  Recent Labs    06/04/19 1836  WBC 5.6  HGB 9.6*  HCT 29.5*  PLT 149*   BMET Recent Labs    06/04/19 1836  NA 137  K 5.0  CL 96*  CO2 25  GLUCOSE 104*  BUN 27*  CREATININE 5.78*  CALCIUM 8.7*   PT/INR No results for input(s): LABPROT, INR in the last 72 hours. CMP     Component Value Date/Time   NA 137 06/04/2019 1836   NA 133 (L) 06/07/2018 0737   K 5.0 06/04/2019 1836   CL 96 (L) 06/04/2019 1836   CO2 25 06/04/2019 1836   GLUCOSE 104 (H) 06/04/2019 1836   BUN 27 (H) 06/04/2019 1836   BUN 79 (HH) 06/07/2018 0737   CREATININE 5.78 (H) 06/04/2019 1836   CALCIUM 8.7 (L) 06/04/2019 1836   PROT 6.6 07/06/2018 1243   ALBUMIN 3.5 05/18/2019 0231   AST 9 (L) 07/06/2018 1243   ALT 13 07/06/2018 1243   ALKPHOS 67 07/06/2018 1243   BILITOT 0.9 07/06/2018 1243   GFRNONAA 10 (L) 06/04/2019 1836   GFRAA 11 (L) 06/04/2019 1836   Lipase     Component Value Date/Time   LIPASE 44 05/24/2017 1720       Studies/Results: Ct Abdomen Pelvis Wo Contrast  Result Date: 06/04/2019 CLINICAL DATA:  Trip  and fall with right sided injury against the corner of [place]. Right sided pain. Back pain. EXAM: CT CHEST, ABDOMEN AND PELVIS WITHOUT CONTRAST TECHNIQUE: Multidetector CT imaging of the chest, abdomen and pelvis was performed following the standard protocol without IV contrast. COMPARISON:  Chest radiograph from earlier today. FINDINGS: CT CHEST FINDINGS Cardiovascular: Mild cardiomegaly. No significant pericardial effusion/thickening. Left anterior descending and left circumflex coronary atherosclerosis. Atherosclerotic thoracic aorta with 4.5 cm ascending thoracic aortic aneurysm. No evidence of acute intramural hematoma in the thoracic aorta. Top-normal caliber main pulmonary artery (3.2 cm diameter). Mediastinum/Nodes: Hypodense 1.0 cm posterior left thyroid lobe nodule. Unremarkable esophagus. No pathologically enlarged axillary, mediastinal or hilar lymph nodes, noting limited sensitivity for the detection of hilar adenopathy on this noncontrast study. Lungs/Pleura: No pneumothorax. Minimal smooth with basilar left pleural thickening with trace dependent left pleural effusion. No right pleural effusion. Diffuse bronchial wall thickening. Patchy consolidation throughout peripheral basilar right lower lobe. Scattered parenchymal bands in basilar left lower lobe. Mosaic attenuation in both lungs. Mild diffuse interlobular septal thickening. No lung masses or significant pulmonary nodules. No central airway stenoses. Musculoskeletal: No aggressive appearing focal osseous  lesions. Nondisplaced acute posterolateral right eighth and posterior right ninth rib fractures. Moderate thoracic spondylosis. CT ABDOMEN PELVIS FINDINGS Hepatobiliary: Normal size liver. No liver mass. No appreciable liver laceration on this noncontrast scan. Normal gallbladder with no radiopaque cholelithiasis. No biliary ductal dilatation. Pancreas: Normal, with no mass or duct dilation. Spleen: Normal size. No mass. Adrenals/Urinary  Tract: Normal adrenals. Symmetric severe bilateral renal cortical atrophy. No renal stones. No hydronephrosis. Scattered subcentimeter hypodense right renal cortical lesions are too small to characterize and require no follow-up. No contour deforming left renal lesions. Normal bladder. Stomach/Bowel: Normal non-distended stomach. Normal caliber small bowel with no small bowel wall thickening. Normal appendix. Normal large bowel with no diverticulosis, large bowel wall thickening or pericolonic fat stranding. Vascular/Lymphatic: Atherosclerotic nonaneurysmal abdominal aorta. No pathologically enlarged lymph nodes in the abdomen or pelvis. Reproductive: Top-normal size prostate. Other: No pneumoperitoneum, ascites or focal fluid collection. Small fat containing umbilical hernia. Musculoskeletal: No aggressive appearing focal osseous lesions. No fracture in the abdomen or pelvis. Moderate lumbar spondylosis. IMPRESSION: 1. Acute posterior right eighth and ninth rib fractures. No pneumothorax. 2. Patchy consolidation in the peripheral right lower lobe, compatible with pulmonary contusion. 3. Mild cardiomegaly.  Two-vessel coronary atherosclerosis. 4. Mild diffuse interlobular septal thickening, suggesting mild pulmonary edema. 5. Mosaic attenuation throughout both lungs, a nonspecific finding that can be due to air trapping from small airways disease or mosaic perfusion from pulmonary vascular disease. 6. Ascending thoracic aortic 4.5 cm aneurysm. Ascending thoracic aortic aneurysm. Recommend semi-annual imaging followup by CTA or MRA and referral to cardiothoracic surgery if not already obtained. This recommendation follows 2010 ACCF/AHA/AATS/ACR/ASA/SCA/SCAI/SIR/STS/SVM Guidelines for the Diagnosis and Management of Patients With Thoracic Aortic Disease. Circulation. 2010; 121: O122-Q825. Aortic aneurysm NOS (ICD10-I71.9). 7. No evidence of acute traumatic injury in the abdomen or pelvis on this noncontrast scan.  Electronically Signed   By: Ilona Sorrel M.D.   On: 06/04/2019 17:25   Dg Chest 2 View  Result Date: 06/04/2019 CLINICAL DATA:  Trip and fall, right-sided rib pain EXAM: CHEST - 2 VIEW COMPARISON:  05/16/2019 FINDINGS: Cardiomegaly. Pulmonary vascular prominence without overt edema. There is a minimally displaced fracture of the posterior right sixth rib. IMPRESSION: 1. There is a minimally displaced fracture of the posterior right sixth rib. No pneumothorax. 2. Cardiomegaly and pulmonary vascular prominence without overt pulmonary edema. No focal airspace opacity. Electronically Signed   By: Eddie Candle M.D.   On: 06/04/2019 16:07   Ct Chest Wo Contrast  Result Date: 06/04/2019 CLINICAL DATA:  Trip and fall with right sided injury against the corner of [place]. Right sided pain. Back pain. EXAM: CT CHEST, ABDOMEN AND PELVIS WITHOUT CONTRAST TECHNIQUE: Multidetector CT imaging of the chest, abdomen and pelvis was performed following the standard protocol without IV contrast. COMPARISON:  Chest radiograph from earlier today. FINDINGS: CT CHEST FINDINGS Cardiovascular: Mild cardiomegaly. No significant pericardial effusion/thickening. Left anterior descending and left circumflex coronary atherosclerosis. Atherosclerotic thoracic aorta with 4.5 cm ascending thoracic aortic aneurysm. No evidence of acute intramural hematoma in the thoracic aorta. Top-normal caliber main pulmonary artery (3.2 cm diameter). Mediastinum/Nodes: Hypodense 1.0 cm posterior left thyroid lobe nodule. Unremarkable esophagus. No pathologically enlarged axillary, mediastinal or hilar lymph nodes, noting limited sensitivity for the detection of hilar adenopathy on this noncontrast study. Lungs/Pleura: No pneumothorax. Minimal smooth with basilar left pleural thickening with trace dependent left pleural effusion. No right pleural effusion. Diffuse bronchial wall thickening. Patchy consolidation throughout peripheral basilar right lower  lobe. Scattered parenchymal  bands in basilar left lower lobe. Mosaic attenuation in both lungs. Mild diffuse interlobular septal thickening. No lung masses or significant pulmonary nodules. No central airway stenoses. Musculoskeletal: No aggressive appearing focal osseous lesions. Nondisplaced acute posterolateral right eighth and posterior right ninth rib fractures. Moderate thoracic spondylosis. CT ABDOMEN PELVIS FINDINGS Hepatobiliary: Normal size liver. No liver mass. No appreciable liver laceration on this noncontrast scan. Normal gallbladder with no radiopaque cholelithiasis. No biliary ductal dilatation. Pancreas: Normal, with no mass or duct dilation. Spleen: Normal size. No mass. Adrenals/Urinary Tract: Normal adrenals. Symmetric severe bilateral renal cortical atrophy. No renal stones. No hydronephrosis. Scattered subcentimeter hypodense right renal cortical lesions are too small to characterize and require no follow-up. No contour deforming left renal lesions. Normal bladder. Stomach/Bowel: Normal non-distended stomach. Normal caliber small bowel with no small bowel wall thickening. Normal appendix. Normal large bowel with no diverticulosis, large bowel wall thickening or pericolonic fat stranding. Vascular/Lymphatic: Atherosclerotic nonaneurysmal abdominal aorta. No pathologically enlarged lymph nodes in the abdomen or pelvis. Reproductive: Top-normal size prostate. Other: No pneumoperitoneum, ascites or focal fluid collection. Small fat containing umbilical hernia. Musculoskeletal: No aggressive appearing focal osseous lesions. No fracture in the abdomen or pelvis. Moderate lumbar spondylosis. IMPRESSION: 1. Acute posterior right eighth and ninth rib fractures. No pneumothorax. 2. Patchy consolidation in the peripheral right lower lobe, compatible with pulmonary contusion. 3. Mild cardiomegaly.  Two-vessel coronary atherosclerosis. 4. Mild diffuse interlobular septal thickening, suggesting mild  pulmonary edema. 5. Mosaic attenuation throughout both lungs, a nonspecific finding that can be due to air trapping from small airways disease or mosaic perfusion from pulmonary vascular disease. 6. Ascending thoracic aortic 4.5 cm aneurysm. Ascending thoracic aortic aneurysm. Recommend semi-annual imaging followup by CTA or MRA and referral to cardiothoracic surgery if not already obtained. This recommendation follows 2010 ACCF/AHA/AATS/ACR/ASA/SCA/SCAI/SIR/STS/SVM Guidelines for the Diagnosis and Management of Patients With Thoracic Aortic Disease. Circulation. 2010; 121: X324-M010. Aortic aneurysm NOS (ICD10-I71.9). 7. No evidence of acute traumatic injury in the abdomen or pelvis on this noncontrast scan. Electronically Signed   By: Ilona Sorrel M.D.   On: 06/04/2019 17:25   Dg Chest Port 1 View  Result Date: 06/05/2019 CLINICAL DATA:  Contusion EXAM: PORTABLE CHEST 1 VIEW COMPARISON:  Chest radiograph from one day prior. FINDINGS: Stable cardiomediastinal silhouette with top-normal heart size. No pneumothorax. No pleural effusion. Patchy opacity at the right lung base is slightly increased. Stable curvilinear left lung base opacities. No pulmonary edema. IMPRESSION: 1. No pneumothorax. 2. Patchy right lung base opacity, slightly increased, favor a combination of contusion and atelectasis. 3. Stable left lung base scarring versus atelectasis. Electronically Signed   By: Ilona Sorrel M.D.   On: 06/05/2019 10:36    Anti-infectives: Anti-infectives (From admission, onward)   None       Assessment/Plan Fall R rib FX 8-9 - pulls 1000 on IS, but O2 sats between 88-92% on 3L [place].  Doesn't wear O2 at home.  Cont aggressive pulm toilet.  Patient wants to leave today, will defer to medical team as we discussed that generally we would like to see patient's doing better than this prior to discharge. ESRD - per Renal FEN - renal diet VTE - ok for chemical prophylaxis from our standpoint ID - none currently  needed   LOS: 1 day    Henreitta Cea , [facility] Surgery 06/06/2019, 8:45 AM Pager: [phone]

## 2019-06-06 NOTE — Progress Notes (Signed)
Orthopedic Tech Progress Note Patient Details:  Thomas Robinson 04-16-1961 [id number] Someone called requesting that patient gets an AFO from [facility]. Therapy believe patient would benefit from it. Patient ID: Thomas Robinson, male   DOB: 1961/01/02, 58 y.o.   MRN: [id number]   [name] 06/06/2019, 11:55 AM

## 2019-06-06 NOTE — Progress Notes (Signed)
PROGRESS NOTE  Thomas Robinson QQV:[id number] DOB: [birth year] DOA: 06/04/2019 PCP: Charlott Rakes, MD  HPI/Recap of past 24 hours: Thomas Robinson is a 58 y.o. male with medical history significant of ESRD-HD (TTS), HTN, asthma, stroke, GERD, depression, CHF, anemia, GIB, who presents with fall.  Pt states that he fell when he was walking in his bedroom, tripped, fell, hit the right side of his chest on the box spring of the bed. He developed pain in the right side of the chest, right flank and right upper abdomen.  The pain is a sharp, 10 out of 10 severity, pleuritic, nonradiating.  He does not have loss consciousness.  Strongly denies head injury or neck injury.  No headache or neck pain.  Patient has some mild shortness of breath, no cough, fever or chills. Patient does not have nausea, vomiting, diarrhea.  No symptoms of UTI. He had his last episode of dialysis today but dialyze for 3 hours because he stopped early because of cramping.  ED Course: pt was found to have WBC 5.6, stable hemoglobin, negative COVID-19 test, potassium 5.0, bicarbonate 25, creatinine 5.78, BUN 27, temperature normal, oxygen saturation 85% on room air, blood pressure 143/91, heart rate 67, tachypnea. Pt is placed on telemetry bed for observation.  CT of chest and abdomen/pelvis showed right rib fracture (eighth and ninth), pulmonary contusion.  General surgeon, Dr. Kieth Brightly was consulted.  CT of abd/pelvis and Chest: 1. Aute posterior right eighth and ninth rib fractures. No pneumothorax. 2. Patchy consolidation in the peripheral right lower lobe, compatible with pulmonary contusion. 3. Mild cardiomegaly. Two-vessel coronary atherosclerosis. 4. Mild diffuse interlobular septal thickening, suggesting mild pulmonary edema. 5. Mosaic attenuation throughout both lungs, a nonspecific finding that can be due to air trapping from small airways disease or mosaic perfusion from pulmonary vascular disease. 6.  Ascending thoracic aortic 4.5 cm aneurysm. Ascending thoracic aortic aneurysm.   06/06/19: Patient was seen and examined at his bedside this morning.  Failed home O2 evaluation yesterday 06/05/19 with O2 saturation in the 80s.  Patient not on oxygen supplementation at this time.  He wants to go home.  Patient is currently not appropriate for discharge at this time.  Needs hospitalization for pain control and to continue working with incentive spirometer in order to improve his oxygenation.  Discussed with nephrology possible hemodialysis on 06/07/2019, to keep his schedule TTS.    Assessment/Plan: Principal Problem:   Fall at home, initial encounter Active Problems:   Essential hypertension   ESRD on dialysis [facility])   Right rib fracture   Lung contusion   Acute respiratory failure with hypoxia (HCC)   Anemia in ESRD (end-stage renal disease) (HCC)   GERD (gastroesophageal reflux disease)   Chronic diastolic (congestive) heart failure ([facility])   Fall   Thoracic ascending aortic aneurysm (HCC)   Rib fractures   Right eighth and ninth rib fractures complicated by right pulmonary contusion, POA Independently reviewed CT chest done on admission which showed right eighth and ninth rib fracture and pulmonary contusion Currently on 3 L of oxygen by nasal cannula saturating 95% Not on O2 supplementation at home Obtain home O2 evaluation Repeat chest x-ray at 1300 DC IV pain medications; avoid morphine in ESRD patients On oxycodone as needed for severe pain Continue bowel regimen to avoid opiate-induced constipation Optimize pain control and encourage incentive spirometer.  Acute hypoxic respiratory failure likely related to chest wall pain from rib fractures x2 Not on oxygen supplementation at baseline Currently on  3 L with O2 saturation 95% Home O2 evaluation 06/05/2019: Patient Saturations on Room Air at Rest = 87% Patient Saturations on Room Air while Ambulating = 83% Patient Saturations on  3 Liters of oxygen while Ambulating = 88-91% Continue continuous pulse oximetry Personally reviewed chest x-ray done on 06/05/2019 showed right lower lobe atelectasis, no sign of pneumothorax.   Thoracic aortic aneurysm measuring 4.5 cm No acute issues Recommend surveillance with repeat imaging outpatient. Blood pressure goal normotensive  ESRD on HD TTS Last HD on Saturday, 06/04/2019 Nephrology aware, possible HD tomorrow 06/07/2019  Asthma Stable Continue nebs  Essential hypertension Blood pressure is at goal Continue home Norvasc 10 mg daily, Coreg 12.5 mg twice daily, p.o. hydralazine 100 mg 3 times daily Continue to closely monitor vital signs  GERD Continue Protonix daily  Chronic diastolic CHF 2D echo done on 05/17/2019 showed LVEF greater than 65 with grade 1 diastolic dysfunction Volume managed by hemodialysis  Anemia of chronic disease in the setting of ESRD Remaining stable Continue to monitor    DVT ppx: SCD Code Status: Full code Family Communication: None at bed side   Disposition Plan: Possible discharge to home with home health services when hemodynamically stable and general surgery signs off.  Consults called:   General surgery, nephrology    Objective: Vitals:   06/05/19 2045 06/06/19 0838 06/06/19 0839 06/06/19 0956  BP:   (!) 148/69   Pulse:   66   Resp:   16   Temp:   98.3 F (36.8 C)   TempSrc:   Oral   SpO2: 97% 92% 92% (!) 88%  Weight:      Height:        Intake/Output Summary (Last 24 hours) at 06/06/2019 1120 Last data filed at 06/06/2019 0900 Gross per 24 hour  Intake 740 ml  Output 0 ml  Net 740 ml   Filed Weights   06/05/19 0146  Weight: 61.8 kg    Exam:  . General: 58 y.o. year-old male well-developed well-nourished no acute distress.  Taking shallow breaths. . Cardiovascular: Regular rate and rhythm no rubs or gallops no JVD or thyromegaly noted.   [name] Respiratory: Mild rales at bases no wheezing noted.  Poor  inspiratory effort. . Abdomen: Soft nontender nondistended normal bowel sounds present.   . Musculoskeletal: No lower extremity edema.  2 out of 4 pulses in all 4 extremities.   [name] Psychiatry: Mood is appropriate for condition and setting.   Data Reviewed: CBC: Recent Labs  Lab 06/04/19 1836  WBC 5.6  NEUTROABS 4.5  HGB 9.6*  HCT 29.5*  MCV 112.2*  PLT 017*   Basic Metabolic Panel: Recent Labs  Lab 06/04/19 1836  NA 137  K 5.0  CL 96*  CO2 25  GLUCOSE 104*  BUN 27*  CREATININE 5.78*  CALCIUM 8.7*   GFR: Estimated Creatinine Clearance: 11.9 mL/min (A) (by C-G formula based on SCr of 5.78 mg/dL (H)). Liver Function Tests: No results for input(s): AST, ALT, ALKPHOS, BILITOT, PROT, ALBUMIN in the last 168 hours. No results for input(s): LIPASE, AMYLASE in the last 168 hours. No results for input(s): AMMONIA in the last 168 hours. Coagulation Profile: No results for input(s): INR, PROTIME in the last 168 hours. Cardiac Enzymes: No results for input(s): CKTOTAL, CKMB, CKMBINDEX, TROPONINI in the last 168 hours. BNP (last 3 results) No results for input(s): PROBNP in the last 8760 hours. HbA1C: No results for input(s): HGBA1C in the last 72 hours. CBG: No  results for input(s): GLUCAP in the last 168 hours. Lipid Profile: No results for input(s): CHOL, HDL, LDLCALC, TRIG, CHOLHDL, LDLDIRECT in the last 72 hours. Thyroid Function Tests: No results for input(s): TSH, T4TOTAL, FREET4, T3FREE, THYROIDAB in the last 72 hours. Anemia Panel: No results for input(s): VITAMINB12, FOLATE, FERRITIN, TIBC, IRON, RETICCTPCT in the last 72 hours. Urine analysis:    Component Value Date/Time   COLORURINE YELLOW 05/24/2017 1729   APPEARANCEUR HAZY (A) 05/24/2017 1729   LABSPEC 1.011 05/24/2017 1729   PHURINE 5.0 05/24/2017 1729   GLUCOSEU 50 (A) 05/24/2017 1729   HGBUR SMALL (A) 05/24/2017 1729   BILIRUBINUR NEGATIVE 05/24/2017 1729   KETONESUR NEGATIVE 05/24/2017 1729    PROTEINUR 100 (A) 05/24/2017 1729   UROBILINOGEN 0.2 04/09/2015 1527   NITRITE NEGATIVE 05/24/2017 1729   LEUKOCYTESUR TRACE (A) 05/24/2017 1729   Sepsis Labs: @LABRCNTIP (procalcitonin:4,lacticidven:4)  ) Recent Results (from the past 240 hour(s))  SARS Coronavirus 2 [facility] order, Performed in [facility] hospital lab) Nasopharyngeal Nasopharyngeal Swab     Status: None   Collection Time: 06/04/19  5:58 PM   Specimen: Nasopharyngeal Swab  Result Value Ref Range Status   SARS Coronavirus 2 NEGATIVE NEGATIVE Final    Comment: (NOTE) If result is NEGATIVE SARS-CoV-2 target nucleic acids are NOT DETECTED. The SARS-CoV-2 RNA is generally detectable in upper and lower  respiratory specimens during the acute phase of infection. The lowest  concentration of SARS-CoV-2 viral copies this assay can detect is 250  copies / mL. A negative result does not preclude SARS-CoV-2 infection  and should not be used as the sole basis for treatment or other  patient management decisions.  A negative result may occur with  improper specimen collection / handling, submission of specimen other  than nasopharyngeal swab, presence of viral mutation(s) within the  areas targeted by this assay, and inadequate number of viral copies  (<250 copies / mL). A negative result must be combined with clinical  observations, patient history, and epidemiological information. If result is POSITIVE SARS-CoV-2 target nucleic acids are DETECTED. The SARS-CoV-2 RNA is generally detectable in upper and lower  respiratory specimens dur ing the acute phase of infection.  Positive  results are indicative of active infection with SARS-CoV-2.  Clinical  correlation with patient history and other diagnostic information is  necessary to determine patient infection status.  Positive results do  not rule out bacterial infection or co-infection with other viruses. If result is PRESUMPTIVE POSTIVE SARS-CoV-2 nucleic acids MAY BE  PRESENT.   A presumptive positive result was obtained on the submitted specimen  and confirmed on repeat testing.  While 2019 novel coronavirus  (SARS-CoV-2) nucleic acids may be present in the submitted sample  additional confirmatory testing may be necessary for epidemiological  and / or clinical management purposes  to differentiate between  SARS-CoV-2 and other Sarbecovirus currently known to infect humans.  If clinically indicated additional testing with an alternate test  methodology [phone]) is advised. The SARS-CoV-2 RNA is generally  detectable in upper and lower respiratory sp ecimens during the acute  phase of infection. The expected result is Negative. Fact Sheet for Patients:  StrictlyIdeas.no Fact Sheet for Healthcare Providers: BankingDealers.co.za This test is not yet approved or cleared by the [place] FDA and has been authorized for detection and/or diagnosis of SARS-CoV-2 by FDA under an Emergency Use Authorization (EUA).  This EUA will remain in effect (meaning this test can be used) for the duration of the COVID-19  declaration under Section 564(b)(1) of the Act, 21 U.S.C. section 360bbb-3(b)(1), unless the authorization is terminated or revoked sooner. Performed at [facility], [place] [address], [place],  [address]       Studies: No results found.  Scheduled Meds: . amLODipine  10 mg Oral Daily  . aspirin EC  81 mg Oral Daily  . carvedilol  12.5 mg Oral BID WC  . Chlorhexidine Gluconate Cloth  6 each Topical Q0600  . cinacalcet  30 mg Oral Q supper  . [START ON 06/07/2019] darbepoetin (ARANESP) injection - DIALYSIS  100 mcg Intravenous Q Tue-HD  . [START ON 06/07/2019] doxercalciferol  4 mcg Intravenous Q T,Th,Sa-HD  . gabapentin  300 mg Oral QHS  . hydrALAZINE  100 mg Oral Q8H  . lidocaine  1 patch Transdermal Q24H  . mometasone-formoterol  2 puff Inhalation BID  . multivitamin   1 tablet Oral Daily  . pantoprazole  40 mg Oral Daily  . polyethylene glycol  17 g Oral Daily  . rOPINIRole  0.5 mg Oral BID  . senna-docusate  2 tablet Oral BID  . sevelamer carbonate  2,400 mg Oral TID WC    Continuous Infusions:   LOS: 1 day     Kayleen Memos, MD [facility] Pager [phone]  If 7PM-7AM, please contact night-coverage [email or web address] Password [email or web address] 06/06/2019, 11:20 AM

## 2019-06-06 NOTE — Consult Note (Addendum)
[facility] Renal Consultation Note    Indication for Consultation:  Management of ESRD/hemodialysis; anemia, hypertension/volume and secondary hyperparathyroidism PCP: Dr. Collene Mares  HPI: Holdyn Poyser is a  51 male with ESRD on hemodialysis T,Th,S at [facility]. PMH of HTN, asthma, HFpEF, CVA, medical noncompliance. Last HD 06/04/2019. Ran 2 hours 18 min of 3:45 hr treatment, left 3.6 above OP EDW which he has never reached D/T early S/Os.   He apparently sustained mechanical fall at home 06/04/2019 and consequently developed R chest, R flank and R upper abdomen pain. He presented to ED for evaluation. He was noted to have acute R 8 th and 9 th rib fractures without pneumothorax, patchy consolidation RLL compatible with pulmonary contusions. Also noted was ascending thoracic aortic aneurysm 4.5 CM. Currently he is being seen in his room, says his pain in controlled and he is going home despite O2 sats 90 on 4 L/M nasal O2. Says he isn't hurting and "needs to get out of here".   Past Medical History:  Diagnosis Date  . Acute kidney injury superimposed on CKD ([place]) 05/24/2017  . Acute on chronic diastolic CHF (congestive heart failure) ([place]) 04/13/2018  . AKI (acute kidney injury) ([place]) 04/10/2015  . Asthma   . Asthma, chronic, unspecified asthma severity, with acute exacerbation 10/23/2017  . Atypical chest pain 02/16/2018  . CAP (community acquired pneumonia) 10/23/2017  . Cerebral infarction due to thrombosis of cerebral artery ([place])   . Cerebrovascular accident (CVA) ([place])   . Chronic kidney disease   . CKD (chronic kidney disease), stage V ([place]) 05/24/2017  . ESRD (end stage renal disease) on dialysis ([place]) 02/16/2018  . Essential hypertension 04/09/2015  . GERD (gastroesophageal reflux disease) 06/04/2019  . GI bleeding 05/24/2017  . History of completed stroke   . Hyperlipidemia   . Hypertension   . Hypertensive urgency 05/24/2017  . Kidney failure    . Respiratory failure, acute ([place]) 10/23/2017  . Restless leg syndrome   . Stroke ([place])   . Symptomatic anemia 05/24/2017   Past Surgical History:  Procedure Laterality Date  . AV FISTULA PLACEMENT Left 05/28/2017   Procedure: LEFT ARM ARTERIOVENOUS (AV) FISTULA CREATION;  Surgeon: Conrad Lawnside, MD;  Location: [place];  Service: Vascular;  Laterality: Left;  . IR FLUORO GUIDE CV LINE RIGHT  05/25/2017  . IR US GUIDE VASC ACCESS RIGHT  05/25/2017  . RINOPLASTY     Family History  Problem Relation Age of Onset  . Cancer Mother    Social History:  reports that he has never smoked. He has never used smokeless tobacco. He reports that he does not drink alcohol or use drugs. No Known Allergies Prior to Admission medications   Medication Sig Start Date End Date Taking? Authorizing Provider  albuterol (PROVENTIL HFA;VENTOLIN HFA) 108 (90 BASE) MCG/ACT inhaler Inhale 1-2 puffs into the lungs every 6 (six) hours as needed for wheezing or shortness of breath. 04/12/15  Yes [name], MD  amLODipine (NORVASC) 10 MG tablet Take 1 tablet (10 mg total) by mouth daily. 05/18/19  Yes Martyn Malay, MD  aspirin EC 81 MG EC tablet Take 1 tablet (81 mg total) by mouth daily. 04/16/18  Yes Bonnell Public, MD  budesonide-formoterol (SYMBICORT) 80-4.5 MCG/ACT inhaler Inhale 2 puffs into the lungs 2 (two) times daily. 05/18/19  Yes Martyn Malay, MD  carvedilol (COREG) 12.5 MG tablet Take 1 tablet (12.5 mg total) by mouth 2 (two)  times daily with a meal. 05/18/19  Yes Martyn Malay, MD  cinacalcet (SENSIPAR) 30 MG tablet Take 30 mg by mouth daily with supper. Do not take less than 12 hours prior to dialysis 01/25/18  Yes [provider]  DUREZOL 0.05 % EMUL Place 1 drop into the right eye See admin instructions. Start 3 days before procedure 05/12/19  Yes [provider]  gabapentin (NEURONTIN) 300 MG capsule Take 300 mg by mouth at bedtime. 05/03/19  Yes [provider]   gatifloxacin (ZYMAXID) 0.5 % SOLN Place 1 drop into the right eye See admin instructions. Start 3 days before procedure 05/13/19  Yes [provider]  hydrALAZINE (APRESOLINE) 100 MG tablet Take 1 tablet (100 mg total) by mouth every 8 (eight) hours. 05/18/19  Yes Martyn Malay, MD  multivitamin (RENA-VIT) TABS tablet Take 1 tablet by mouth daily. 01/13/18  Yes [provider]  pantoprazole (PROTONIX) 40 MG tablet Take 40 mg by mouth daily. 03/29/18  Yes [provider]  rOPINIRole (REQUIP) 0.5 MG tablet Take 0.5 mg by mouth 2 (two) times daily. 01/26/18  Yes [provider]  sevelamer (RENAGEL) 800 MG tablet Take 800-1,600 mg by mouth See admin instructions. 1,600mg  by mouth three times daily and 800mg  twice daily with snacks 01/25/18  Yes [provider]  traMADol-acetaminophen (ULTRACET) 37.5-325 MG tablet Take 1 tablet by mouth every 6 (six) hours as needed. for pain 05/06/19  Yes [provider]  acetaminophen (TYLENOL) 500 MG tablet Take 1,000 mg by mouth every 6 (six) hours as needed for moderate pain.    [provider]  polyethylene glycol (MIRALAX / GLYCOLAX) packet Take 17 g by mouth daily as needed. Patient not taking: Reported on 05/16/2019 06/02/17   [name], MD   Current Facility-Administered Medications  Medication Dose Route Frequency Provider Last Rate Last Dose  . acetaminophen (TYLENOL) tablet 650 mg  650 mg Oral Q6H PRN Ivor Costa, MD       Or  . acetaminophen (TYLENOL) suppository 650 mg  650 mg Rectal Q6H PRN Ivor Costa, MD      . albuterol (PROVENTIL) (2.5 MG/3ML) 0.083% nebulizer solution 2.5 mg  2.5 mg Nebulization Q4H PRN Ivor Costa, MD      . amLODipine (NORVASC) tablet 10 mg  10 mg Oral Daily Ivor Costa, MD   10 mg at 06/05/19 0955  . aspirin EC tablet 81 mg  81 mg Oral Daily Ivor Costa, MD   81 mg at 06/05/19 0955  . carvedilol (COREG) tablet 12.5 mg  12.5 mg Oral BID WC Ivor Costa, MD   12.5 mg at  06/05/19 1715  . cinacalcet (SENSIPAR) tablet 30 mg  30 mg Oral Q supper Ivor Costa, MD   30 mg at 06/05/19 1715  . dextromethorphan-guaiFENesin (MUCINEX DM) 30-600 MG per 12 hr tablet 1 tablet  1 tablet Oral BID PRN Ivor Costa, MD      . gabapentin (NEURONTIN) capsule 300 mg  300 mg Oral QHS Ivor Costa, MD   300 mg at 06/05/19 2159  . hydrALAZINE (APRESOLINE) tablet 100 mg  100 mg Oral Q8H Ivor Costa, MD   100 mg at 06/06/19 0603  . hydrALAZINE (APRESOLINE) tablet 25 mg  25 mg Oral TID PRN Ivor Costa, MD      . lidocaine (LIDODERM) 5 % 1 patch  1 patch Transdermal Q24H Ivor Costa, MD   1 patch at 06/05/19 1715  . methocarbamol (ROBAXIN) tablet 750 mg  750 mg Oral Q8H PRN Georganna Skeans, MD   750 mg at 06/05/19 1242  . mometasone-formoterol (DULERA) 100-5 MCG/ACT inhaler 2 puff  2 puff Inhalation BID Ivor Costa, MD   2 puff at 06/06/19 [phone]  . multivitamin (RENA-VIT) tablet 1 tablet  1 tablet Oral Daily Ivor Costa, MD   1 tablet at 06/05/19 0955  . oxyCODONE-acetaminophen (PERCOCET/ROXICET) 5-325 MG per tablet 1 tablet  1 tablet Oral Q4H PRN Ivor Costa, MD   1 tablet at 06/05/19 2159  . pantoprazole (PROTONIX) EC tablet 40 mg  40 mg Oral Daily Ivor Costa, MD   40 mg at 06/05/19 0955  . polyethylene glycol (MIRALAX / GLYCOLAX) packet 17 g  17 g Oral Daily Hall, Carole N, DO      . rOPINIRole (REQUIP) tablet 0.5 mg  0.5 mg Oral BID Ivor Costa, MD   0.5 mg at 06/05/19 2159  . senna-docusate (Senokot-S) tablet 2 tablet  2 tablet Oral BID Irene Pap N, DO      . sevelamer carbonate (RENVELA) tablet 800 mg  800 mg Oral TID WC Ivor Costa, MD   800 mg at 06/05/19 1715   Labs: Basic Metabolic Panel: Recent Labs  Lab 06/04/19 1836  NA 137  K 5.0  CL 96*  CO2 25  GLUCOSE 104*  BUN 27*  CREATININE 5.78*  CALCIUM 8.7*   Liver Function Tests: No results for input(s): AST, ALT, ALKPHOS, BILITOT, PROT, ALBUMIN in the last 168 hours. No results for input(s): LIPASE, AMYLASE in the last 168  hours. No results for input(s): AMMONIA in the last 168 hours. CBC: Recent Labs  Lab 06/04/19 1836  WBC 5.6  NEUTROABS 4.5  HGB 9.6*  HCT 29.5*  MCV 112.2*  PLT 149*   Cardiac Enzymes: No results for input(s): CKTOTAL, CKMB, CKMBINDEX, TROPONINI in the last 168 hours. CBG: No results for input(s): GLUCAP in the last 168 hours. Iron Studies: No results for input(s): IRON, TIBC, TRANSFERRIN, FERRITIN in the last 72 hours. Studies/Results: Ct Abdomen Pelvis Wo Contrast  Result Date: 06/04/2019 CLINICAL DATA:  Trip and fall with right sided injury against the corner of [place]. Right sided pain. Back pain. EXAM: CT CHEST, ABDOMEN AND PELVIS WITHOUT CONTRAST TECHNIQUE: Multidetector CT imaging of the chest, abdomen and pelvis was performed following the standard protocol without IV contrast. COMPARISON:  Chest radiograph from earlier today. FINDINGS: CT CHEST FINDINGS Cardiovascular: Mild cardiomegaly. No significant pericardial effusion/thickening. Left anterior descending and left circumflex coronary atherosclerosis. Atherosclerotic thoracic aorta with 4.5 cm ascending thoracic aortic aneurysm. No evidence of acute intramural hematoma in the thoracic aorta. Top-normal caliber main pulmonary artery (3.2 cm diameter). Mediastinum/Nodes: Hypodense 1.0 cm posterior left thyroid lobe nodule. Unremarkable esophagus. No pathologically enlarged axillary, mediastinal or hilar lymph nodes, noting limited sensitivity for the detection of hilar adenopathy on this noncontrast study. Lungs/Pleura: No pneumothorax. Minimal smooth with basilar left pleural thickening with trace dependent left pleural effusion. No right pleural effusion. Diffuse bronchial wall thickening. Patchy consolidation throughout peripheral basilar right lower lobe. Scattered parenchymal bands in basilar left lower lobe. Mosaic attenuation in both lungs. Mild diffuse interlobular septal thickening. No lung masses or significant pulmonary  nodules. No central airway stenoses. Musculoskeletal: No aggressive appearing focal osseous lesions. Nondisplaced acute posterolateral right eighth and posterior right ninth rib fractures. Moderate thoracic spondylosis. CT ABDOMEN PELVIS FINDINGS Hepatobiliary: Normal size liver. No liver mass. No appreciable liver laceration on this noncontrast scan. Normal gallbladder with no radiopaque cholelithiasis. No biliary  ductal dilatation. Pancreas: Normal, with no mass or duct dilation. Spleen: Normal size. No mass. Adrenals/Urinary Tract: Normal adrenals. Symmetric severe bilateral renal cortical atrophy. No renal stones. No hydronephrosis. Scattered subcentimeter hypodense right renal cortical lesions are too small to characterize and require no follow-up. No contour deforming left renal lesions. Normal bladder. Stomach/Bowel: Normal non-distended stomach. Normal caliber small bowel with no small bowel wall thickening. Normal appendix. Normal large bowel with no diverticulosis, large bowel wall thickening or pericolonic fat stranding. Vascular/Lymphatic: Atherosclerotic nonaneurysmal abdominal aorta. No pathologically enlarged lymph nodes in the abdomen or pelvis. Reproductive: Top-normal size prostate. Other: No pneumoperitoneum, ascites or focal fluid collection. Small fat containing umbilical hernia. Musculoskeletal: No aggressive appearing focal osseous lesions. No fracture in the abdomen or pelvis. Moderate lumbar spondylosis. IMPRESSION: 1. Acute posterior right eighth and ninth rib fractures. No pneumothorax. 2. Patchy consolidation in the peripheral right lower lobe, compatible with pulmonary contusion. 3. Mild cardiomegaly.  Two-vessel coronary atherosclerosis. 4. Mild diffuse interlobular septal thickening, suggesting mild pulmonary edema. 5. Mosaic attenuation throughout both lungs, a nonspecific finding that can be due to air trapping from small airways disease or mosaic perfusion from pulmonary vascular  disease. 6. Ascending thoracic aortic 4.5 cm aneurysm. Ascending thoracic aortic aneurysm. Recommend semi-annual imaging followup by CTA or MRA and referral to cardiothoracic surgery if not already obtained. This recommendation follows 2010 ACCF/AHA/AATS/ACR/ASA/SCA/SCAI/SIR/STS/SVM Guidelines for the Diagnosis and Management of Patients With Thoracic Aortic Disease. Circulation. 2010; 121: N629-B284. Aortic aneurysm NOS (ICD10-I71.9). 7. No evidence of acute traumatic injury in the abdomen or pelvis on this noncontrast scan. Electronically Signed   By: Ilona Sorrel M.D.   On: 06/04/2019 17:25   Dg Chest 2 View  Result Date: 06/04/2019 CLINICAL DATA:  Trip and fall, right-sided rib pain EXAM: CHEST - 2 VIEW COMPARISON:  05/16/2019 FINDINGS: Cardiomegaly. Pulmonary vascular prominence without overt edema. There is a minimally displaced fracture of the posterior right sixth rib. IMPRESSION: 1. There is a minimally displaced fracture of the posterior right sixth rib. No pneumothorax. 2. Cardiomegaly and pulmonary vascular prominence without overt pulmonary edema. No focal airspace opacity. Electronically Signed   By: Eddie Candle M.D.   On: 06/04/2019 16:07   Ct Chest Wo Contrast  Result Date: 06/04/2019 CLINICAL DATA:  Trip and fall with right sided injury against the corner of [place]. Right sided pain. Back pain. EXAM: CT CHEST, ABDOMEN AND PELVIS WITHOUT CONTRAST TECHNIQUE: Multidetector CT imaging of the chest, abdomen and pelvis was performed following the standard protocol without IV contrast. COMPARISON:  Chest radiograph from earlier today. FINDINGS: CT CHEST FINDINGS Cardiovascular: Mild cardiomegaly. No significant pericardial effusion/thickening. Left anterior descending and left circumflex coronary atherosclerosis. Atherosclerotic thoracic aorta with 4.5 cm ascending thoracic aortic aneurysm. No evidence of acute intramural hematoma in the thoracic aorta. Top-normal caliber main pulmonary artery  (3.2 cm diameter). Mediastinum/Nodes: Hypodense 1.0 cm posterior left thyroid lobe nodule. Unremarkable esophagus. No pathologically enlarged axillary, mediastinal or hilar lymph nodes, noting limited sensitivity for the detection of hilar adenopathy on this noncontrast study. Lungs/Pleura: No pneumothorax. Minimal smooth with basilar left pleural thickening with trace dependent left pleural effusion. No right pleural effusion. Diffuse bronchial wall thickening. Patchy consolidation throughout peripheral basilar right lower lobe. Scattered parenchymal bands in basilar left lower lobe. Mosaic attenuation in both lungs. Mild diffuse interlobular septal thickening. No lung masses or significant pulmonary nodules. No central airway stenoses. Musculoskeletal: No aggressive appearing focal osseous lesions. Nondisplaced acute posterolateral right eighth and posterior right ninth  rib fractures. Moderate thoracic spondylosis. CT ABDOMEN PELVIS FINDINGS Hepatobiliary: Normal size liver. No liver mass. No appreciable liver laceration on this noncontrast scan. Normal gallbladder with no radiopaque cholelithiasis. No biliary ductal dilatation. Pancreas: Normal, with no mass or duct dilation. Spleen: Normal size. No mass. Adrenals/Urinary Tract: Normal adrenals. Symmetric severe bilateral renal cortical atrophy. No renal stones. No hydronephrosis. Scattered subcentimeter hypodense right renal cortical lesions are too small to characterize and require no follow-up. No contour deforming left renal lesions. Normal bladder. Stomach/Bowel: Normal non-distended stomach. Normal caliber small bowel with no small bowel wall thickening. Normal appendix. Normal large bowel with no diverticulosis, large bowel wall thickening or pericolonic fat stranding. Vascular/Lymphatic: Atherosclerotic nonaneurysmal abdominal aorta. No pathologically enlarged lymph nodes in the abdomen or pelvis. Reproductive: Top-normal size prostate. Other: No  pneumoperitoneum, ascites or focal fluid collection. Small fat containing umbilical hernia. Musculoskeletal: No aggressive appearing focal osseous lesions. No fracture in the abdomen or pelvis. Moderate lumbar spondylosis. IMPRESSION: 1. Acute posterior right eighth and ninth rib fractures. No pneumothorax. 2. Patchy consolidation in the peripheral right lower lobe, compatible with pulmonary contusion. 3. Mild cardiomegaly.  Two-vessel coronary atherosclerosis. 4. Mild diffuse interlobular septal thickening, suggesting mild pulmonary edema. 5. Mosaic attenuation throughout both lungs, a nonspecific finding that can be due to air trapping from small airways disease or mosaic perfusion from pulmonary vascular disease. 6. Ascending thoracic aortic 4.5 cm aneurysm. Ascending thoracic aortic aneurysm. Recommend semi-annual imaging followup by CTA or MRA and referral to cardiothoracic surgery if not already obtained. This recommendation follows 2010 ACCF/AHA/AATS/ACR/ASA/SCA/SCAI/SIR/STS/SVM Guidelines for the Diagnosis and Management of Patients With Thoracic Aortic Disease. Circulation. 2010; 121: M841-L244. Aortic aneurysm NOS (ICD10-I71.9). 7. No evidence of acute traumatic injury in the abdomen or pelvis on this noncontrast scan. Electronically Signed   By: Ilona Sorrel M.D.   On: 06/04/2019 17:25   Dg Chest Port 1 View  Result Date: 06/05/2019 CLINICAL DATA:  Contusion EXAM: PORTABLE CHEST 1 VIEW COMPARISON:  Chest radiograph from one day prior. FINDINGS: Stable cardiomediastinal silhouette with top-normal heart size. No pneumothorax. No pleural effusion. Patchy opacity at the right lung base is slightly increased. Stable curvilinear left lung base opacities. No pulmonary edema. IMPRESSION: 1. No pneumothorax. 2. Patchy right lung base opacity, slightly increased, favor a combination of contusion and atelectasis. 3. Stable left lung base scarring versus atelectasis. Electronically Signed   By: Ilona Sorrel M.D.    On: 06/05/2019 10:36    ROS: As per HPI otherwise negative.  Physical Exam: Vitals:   06/05/19 1920 06/05/19 2045 06/06/19 0838 06/06/19 0839  BP: (!) 134/59   (!) 148/69  Pulse: 61   66  Resp: 16   16  Temp: 98.8 F (37.1 C)   98.3 F (36.8 C)  TempSrc: Oral   Oral  SpO2: 95% 97% 92% 92%  Weight:      Height:         General: Thin, chronically ill appearing male  in no acute distress. Head: Normocephalic, atraumatic, sclera non-icteric, mucus membranes are moist Neck: Supple. JVD not elevated. Lungs: Clear bilaterally to auscultation without wheezes, rales, or rhonchi. Breathing is unlabored. R chest pain tender to palpitation, guarding.  Heart: RRR with S1 S2. No murmurs, rubs, or gallops appreciated. Abdomen: Soft, non-tender, non-distended with normoactive bowel sounds. No rebound/guarding. No obvious abdominal masses. M-S:  Strength and tone appear normal for age. Lower extremities:without edema or ischemic changes, no open wounds  Neuro: Alert and oriented X 3. Moves  all extremities spontaneously. Psych:  Responds to questions appropriately with a normal affect. Dialysis Access: LFA AVF aneurysmal with some scabs present, + bruit.   Dialysis Orders: [place] T,Th,S 3:45 hrs 180NRe 350/800 58 kg 2.0 K/2.0 Ca UFP 4  -Heparin 1800 units IV TIW -Mircera 225 mcg IV q 2 weeks (last dose 05/26/19) (last HGB 9.3 06/02/19) -Venofer 50 mg IV weekly -Hectorol 4 mcg IV TIW   Assessment/Plan: 1.  R 8th and 9th rib fractures/R pulmonary contusion. Per primary, trauma following. Insisting he is going home today. Per primary 2. Acute hypoxic respiratory failure-O2 sats 90% on 4L/M [place] now. Per primary. 3. Thoracic ascending aneurysm-will require OP surveillance. Per primary.  4.  ESRD -  T,Th,S via AVF. Will have HD here tomorrow on schedule if he is still here. No heparin. H/O of ongoing non-compliance with HD, truncated treatments.  5.  Hypertension/volume  - No evidence of volume  overload by exam. Last wt 61.8 kg, consistently above OP EDW. BP well controlled on home meds.  6.  Anemia  - HGB 9.6. Give ESA with HD tomorrow. Follow HGB.  7.  Metabolic bone disease -  Ca 8.7 C Ca 9.1 Last OP phos 9.1 09/24.  resume sensipar, binders, VDRA. 8.  Nutrition -Albumin 3.5 Renal diet, renal vit, nepro 9.   H/O HFpEF-no evidence of volume overload by exam.   Jimmye Norman. Owens Shark, NP-C 06/06/2019, 8:44 AM  [employer] [phone]

## 2019-06-06 NOTE — Plan of Care (Signed)

## 2019-06-06 NOTE — Progress Notes (Signed)
Occupational Therapy Treatment Patient Details Name: Thomas Robinson MRN: [id number] DOB: 06/23/1961 Today's Date: 06/06/2019    History of present illness Pt is a 58 y/o male with PMH of ESRD-HD (TTS), HTN, asthma, CVA, depression, CHF, anemia, CIB, presenting after a fall and complains of R sided chest, flank and upper abdomen pain. CT reveals R rib 8-9 fx, pulmonary contusion.    OT comments  Pt making steady progress towards OT goals this session. Session focus on functional mobility as precursor to higher level ADLs. Pt MOD I for bed mobility; MIN guard for functional mobility with no AD. Pt on 3L SaO2 throughout session with sats dropping to low 80s with functional mobility; rebounding to 93 with standing rest break and pursed lip breathing. MIN guard for simulated toilet transfer to EOB with no AD. Updated DC plan as pt would benefit from continued skilled therapy in the home environment to determine fall prevention strategies as pt has a frequent history of falls and poor safety awareness. Discussed updated POC with OTR. Will continue to follow acutely for OT needs.   Follow Up Recommendations  Home health OT;Other (comment)([facility])    Equipment Recommendations  None recommended by OT    Recommendations for Other Services      Precautions / Restrictions Precautions Precautions: Fall Precaution Comments: has fallen at home, alcohol has been related to some of his falls but not all Restrictions Weight Bearing Restrictions: No       Mobility Bed Mobility Overal bed mobility: Modified Independent Bed Mobility: Supine to Sit;Sit to Supine     Supine to sit: Modified independent (Device/Increase time) Sit to supine: Modified independent (Device/Increase time)   General bed mobility comments: HOB elevated no physical assist  Transfers Overall transfer level: Needs assistance Equipment used: None Transfers: Sit to/from Stand Sit to Stand: Min guard          General transfer comment: min guard for safety and balance    Balance Overall balance assessment: Needs assistance Sitting-balance support: No upper extremity supported;Feet supported Sitting balance-Leahy Scale: Good     Standing balance support: No upper extremity supported;During functional activity Standing balance-Leahy Scale: Fair Standing balance comment: min guard for safety/balance, preference to UE support                           ADL either performed or assessed with clinical judgement   ADL Overall ADL's : Needs assistance/impaired       Grooming Details (indicate cue type and reason): declined standing grooming at sink; states we don't have the right products         Upper Body Dressing : Set up;Sitting Upper Body Dressing Details (indicate cue type and reason): EOB to don gown     Toilet Transfer: Ambulation;Min Psychiatric nurse Details (indicate cue type and reason): simulated in room;MIN guard for safety         Functional mobility during ADLs: Min guard General ADL Comments: impaired balance; desats with activity on 3 L     Vision Patient Visual Report: No change from baseline Vision Assessment?: No apparent visual deficits   Perception     Praxis      Cognition Arousal/Alertness: Awake/alert Behavior During Therapy: WFL for tasks assessed/performed Overall Cognitive Status: Impaired/Different from baseline Area of Impairment: Safety/judgement;Awareness;Problem solving  Safety/Judgement: Decreased awareness of safety;Decreased awareness of deficits Awareness: Emergent Problem Solving: Requires verbal cues General Comments: pt oriented and pleasant, but poor awareness of deficits        Exercises     Shoulder Instructions       General Comments pt on 3L O2 at start of session supine in bed 88%; rebounded to 93% with pursed lip breathing; de-sat to 80 during funcitonal mobility;  able to rebound to low 90s with standing rest break and continued pursed lip breathing    Pertinent Vitals/ Pain       Pain Assessment: Faces Faces Pain Scale: Hurts a little bit Pain Location: R flank,ribs Pain Descriptors / Indicators: Discomfort;Aching;Sharp Pain Intervention(s): Monitored during session;Repositioned  Home Living                                          Prior Functioning/Environment              Frequency  Min 2X/week        Progress Toward Goals  OT Goals(current goals can now be found in the care plan section)  Progress towards OT goals: Progressing toward goals  Acute Rehab OT Goals Patient Stated Goal: lay back down for a nap OT Goal Formulation: With patient Potential to Achieve Goals: Good  Plan Discharge plan needs to be updated    Co-evaluation                 AM-PAC OT "6 Clicks" Daily Activity     Outcome Measure   Help from another person eating meals?: None Help from another person taking care of personal grooming?: A Little Help from another person toileting, which includes using toliet, bedpan, or urinal?: None Help from another person bathing (including washing, rinsing, drying)?: A Little Help from another person to put on and taking off regular upper body clothing?: A Little Help from another person to put on and taking off regular lower body clothing?: A Little 6 Click Score: 20    End of Session Equipment Utilized During Treatment: Oxygen;Gait belt  OT Visit Diagnosis: Repeated falls (R29.6);Unsteadiness on feet (R26.81);Pain Pain - Right/Left: Right   Activity Tolerance Patient tolerated treatment well   Patient Left in bed;with call bell/phone within reach;with bed alarm set   Nurse Communication Mobility status;Other (comment)        Time: 7371-0626 OT Time Calculation (min): 23 min  Charges: OT General Charges $OT Visit: 1 Visit OT Treatments $Therapeutic Activity: 23-37  mins  [place], [place] [phone] [place] 06/06/2019, 10:44 AM

## 2019-06-06 NOTE — TOC Initial Note (Addendum)
Transition of Care [facility]) - Initial/Assessment Note    Patient Details  Name: Thomas Robinson MRN: [id number] Date of Birth: Jan 18, 1961  [facility] CM/SW Contact:    Bartholomew Crews, RN Phone Number: [phone] 06/06/2019, 10:30 AM  Clinical Narrative:                 Spoke with patient at the bedside. Patient lying in bed with oxygen on. Patient states that he is not on oxygen at home and probably would not use it if he had it. CM turned off oxygen and montored O2 sat - oxygen sat dropped with to 86% while lying in bed and talking in less than 5 minutes. O2 resumed at 3 l/m with return of O2 sat to 92-3%. Discussed that it would be good to have oxygen at home if needed.   Discussed RW per PT recommendations. Patient states that he would not use. Discussed AFO for left leg per PT recommendations, and patient interested in this option. MD please order if you agree.    Discussed HH PT. Patient declined, however, he thinks outpatient PT would be a good option. He stated that he had been going to the gym prepandemic where he would walk on the treadmill. MD if you agree, please place outpatient referral for PT.   PTA living with his aunt. D[facility]. If patient discharged on a TTS, please advise Jaclyn Shaggy [phone] who will arrange for outpatient HD in order to avoid day of discharge inpatient dialysis. States that he has friends who can pick him up post HD if he felt that he could not drive himself home.   States that he was retired 2 years ago after having a stroke and then starting HD 5 months later.   Patient reports no concerns about medication management or adherence to diet/fluid management.   Following for transition of care needs.   Expected Discharge Plan: OP Rehab Barriers to Discharge: Continued Medical Work up   Patient Goals and CMS Choice Patient states their goals for this hospitalization and ongoing recovery are:: ready to go  home CMS Medicare.gov Compare Post Acute Care list provided to:: Patient Choice offered to / list presented to : Patient  Expected Discharge Plan and Services Expected Discharge Plan: OP Rehab In-house Referral: NA Discharge Planning Services: CM Consult Post Acute Care Choice: Home Health, Durable Medical Equipment Living arrangements for the past 2 months: Single Family Home                 DME Arranged: Brace, Leg, Oxygen, Walker rolling         HH Arranged: Refused [place] Agency: NA        Prior Living Arrangements/Services Living arrangements for the past 2 months: Single Family Home Lives with:: Self, Relatives(shares a home with his aunt) Patient language and need for interpreter reviewed:: Yes Do you feel safe going back to the place where you live?: Yes            Criminal Activity/Legal Involvement Pertinent to Current Situation/Hospitalization: No - Comment as needed  Activities of Daily Living Home Assistive Devices/Equipment: Eyeglasses(reading) ADL Screening (condition at time of admission) Patient's cognitive ability adequate to safely complete daily activities?: Yes Is the patient deaf or have difficulty hearing?: No Does the patient have difficulty seeing, even when wearing glasses/contacts?: No(after surgery) Does the patient have difficulty concentrating, remembering, or making decisions?: No Patient able to express need  for assistance with ADLs?: Yes Does the patient have difficulty dressing or bathing?: No Independently performs ADLs?: Yes (appropriate for developmental age) Does the patient have difficulty walking or climbing stairs?: Yes Weakness of Legs: Left Weakness of Arms/Hands: Left  Permission Sought/Granted                  Emotional Assessment Appearance:: Appears stated age Attitude/Demeanor/Rapport: Engaged Affect (typically observed): Accepting Orientation: : Oriented to Situation, Oriented to  Time, Oriented to Place,  Oriented to Self Alcohol / Substance Use: Not Applicable Psych Involvement: No (comment)  Admission diagnosis:  Hypoxia [R09.02] Contusion of right lung, initial encounter [S27.321A] Closed fracture of multiple ribs of right side, initial encounter [S22.41XA] Patient Active Problem List   Diagnosis Date Noted  . Rib fractures 06/05/2019  . Fall at home, initial encounter 06/04/2019  . Right rib fracture 06/04/2019  . Lung contusion 06/04/2019  . Acute respiratory failure with hypoxia ([place]) 06/04/2019  . Anemia in ESRD (end-stage renal disease) ([place]) 06/04/2019  . GERD (gastroesophageal reflux disease) 06/04/2019  . Chronic diastolic (congestive) heart failure ([place]) 06/04/2019  . Fall 06/04/2019  . Thoracic ascending aortic aneurysm ([place]) 06/04/2019  . Acute exacerbation of congestive heart failure ([place]) 05/16/2019  . Chest pain 04/13/2018  . Acute on chronic diastolic CHF (congestive heart failure) ([place]) 04/13/2018  . Chest pressure   . Atypical chest pain 02/16/2018  . ESRD on dialysis ([place]) 02/16/2018  . Volume overload 10/23/2017  . Cough 08/23/2017  . Constipation   . Macrocytic anemia 05/24/2017  . Hypertensive urgency 05/24/2017  . History of completed stroke   . Essential hypertension 04/09/2015   PCP:  Charlott Rakes, MD Pharmacy:   [id number] [address], [place] [address] Phone: [phone] Fax: [phone]     Social Determinants of Health (SDOH) Interventions    Readmission Risk Interventions No flowsheet data found.

## 2019-06-07 NOTE — Progress Notes (Signed)
Patient refusing to go to HD this AM. States he is going to his outpatient dialysis clinic this afternoon and wants to leave hospital. Patient also refusing vitals this AM from both RN and NT. Patient educated. Will continue to monitor.

## 2019-06-07 NOTE — Plan of Care (Signed)
  Problem: Education: Goal: Knowledge of General Education information will improve Description: Including pain rating scale, medication(s)/side effects and non-pharmacologic comfort measures Outcome: Progressing   Problem: Activity: Goal: Risk for activity intolerance will decrease Outcome: Progressing   

## 2019-06-07 NOTE — Plan of Care (Signed)
  Problem: Health Behavior/Discharge Planning: Goal: Ability to manage health-related needs will improve Outcome: Progressing   Problem: Clinical Measurements: Goal: Respiratory complications will improve Outcome: Progressing   Problem: Coping: Goal: Level of anxiety will decrease Outcome: Progressing

## 2019-06-09 DIAGNOSIS — D631 Anemia in chronic kidney disease: Secondary | ICD-10-CM | POA: Diagnosis not present

## 2019-06-09 DIAGNOSIS — N2581 Secondary hyperparathyroidism of renal origin: Secondary | ICD-10-CM | POA: Diagnosis not present

## 2019-06-09 DIAGNOSIS — Z992 Dependence on renal dialysis: Secondary | ICD-10-CM | POA: Diagnosis not present

## 2019-06-09 DIAGNOSIS — D509 Iron deficiency anemia, unspecified: Secondary | ICD-10-CM | POA: Diagnosis not present

## 2019-06-09 DIAGNOSIS — E876 Hypokalemia: Secondary | ICD-10-CM | POA: Diagnosis not present

## 2019-06-09 DIAGNOSIS — N186 End stage renal disease: Secondary | ICD-10-CM | POA: Diagnosis not present

## 2019-06-10 ENCOUNTER — Encounter: Payer: Self-pay | Admitting: Cardiology

## 2019-06-10 ENCOUNTER — Ambulatory Visit (INDEPENDENT_AMBULATORY_CARE_PROVIDER_SITE_OTHER): Payer: Medicare Other | Admitting: Cardiology

## 2019-06-10 ENCOUNTER — Other Ambulatory Visit: Payer: Self-pay

## 2019-06-10 VITALS — BP 180/70 | HR 70 | Ht 66.0 in | Wt 140.0 lb

## 2019-06-10 DIAGNOSIS — I5032 Chronic diastolic (congestive) heart failure: Secondary | ICD-10-CM | POA: Diagnosis not present

## 2019-06-10 DIAGNOSIS — I10 Essential (primary) hypertension: Secondary | ICD-10-CM

## 2019-06-10 DIAGNOSIS — I25119 Atherosclerotic heart disease of native coronary artery with unspecified angina pectoris: Secondary | ICD-10-CM | POA: Diagnosis not present

## 2019-06-10 DIAGNOSIS — T782XXS Anaphylactic shock, unspecified, sequela: Secondary | ICD-10-CM

## 2019-06-10 DIAGNOSIS — N186 End stage renal disease: Secondary | ICD-10-CM | POA: Diagnosis not present

## 2019-06-10 DIAGNOSIS — E782 Mixed hyperlipidemia: Secondary | ICD-10-CM | POA: Diagnosis not present

## 2019-06-10 DIAGNOSIS — I7121 Aneurysm of the ascending aorta, without rupture: Secondary | ICD-10-CM

## 2019-06-10 DIAGNOSIS — I712 Thoracic aortic aneurysm, without rupture: Secondary | ICD-10-CM | POA: Diagnosis not present

## 2019-06-10 DIAGNOSIS — I5189 Other ill-defined heart diseases: Secondary | ICD-10-CM

## 2019-06-10 HISTORY — DX: Anaphylactic shock, unspecified, sequela: T78.2XXS

## 2019-06-10 MED ORDER — ATORVASTATIN CALCIUM 20 MG PO TABS: 20.0000 mg | ORAL_TABLET | Freq: Every day | ORAL | 1 refills | Status: DC

## 2019-06-10 NOTE — Discharge Summary (Signed)
Discharge Summary  Thomas Robinson WLS:[id number] DOB: Jan 14, 1961  PCP: Thomas Rakes, MD  Admit date: 06/04/2019 Discharge date: 06/10/2019  Patient left against medical advice.  Recommendations for Outpatient Follow-up:  1. Left AMA.  Discharge Diagnoses:  Active Hospital Problems   Diagnosis Date Noted   Fall at home, initial encounter 06/04/2019   Rib fractures 06/05/2019   Right rib fracture 06/04/2019   Lung contusion 06/04/2019   Acute respiratory failure with hypoxia ([place]) 06/04/2019   Anemia in ESRD (end-stage renal disease) ([place]) 06/04/2019   GERD (gastroesophageal reflux disease) 06/04/2019   Chronic diastolic (congestive) heart failure ([place]) 06/04/2019   Fall 06/04/2019   Thoracic ascending aortic aneurysm ([place]) 06/04/2019   ESRD on dialysis [facility]) 02/16/2018   Essential hypertension 04/09/2015    Resolved Hospital Problems  No resolved problems to display.    Vitals:   06/06/19 1935 06/06/19 2050  BP: (!) 164/58   Pulse: 74 70  Resp: 16 18  Temp: 98.3 F (36.8 C)   SpO2: 93% 93%    History of present illness:   Thomas Robinson a 58 y.o.malewith medical history significant ofESRD-HD (TTS), HTN, asthma,stroke, GERD, depression, CHF, anemia, GIB,who presents with fall.  Pt states that he fell when hewas walking in his bedroom, tripped, fell, hit the right side of his chest on the box spring of the bed.He developed pain in theright side of the chest, rightflankand right upperabdomen.The pain is a sharp, 10 out of 10 severity, pleuritic, nonradiating. He does not have loss consciousness. Strongly denies head injury or neck injury. No headache or neck pain. Patient has some mild shortness of breath, no cough, fever or chills. Patient does not have nausea, vomiting, diarrhea.No symptoms of UTI. He had his last episode of dialysis today but dialyze for 3 hours because he stopped early because of cramping.  ED Course:pt was  found to have WBC 5.6, stable hemoglobin, negative COVID-19 test, potassium 5.0, bicarbonate 25, creatinine 5.78, BUN 27, temperature normal, oxygen saturation 85% on room air, blood pressure 143/91, heart rate 67, tachypnea. Pt isplaced on telemetry bed for observation. CT of chest and abdomen/pelvis showed right rib fracture (eighth and ninth), pulmonary contusion. General surgeon, Dr. Merlyn Robinson consulted.  CT of abd/pelvis and Chest: 1. Aute posterior right eighth and ninth rib fractures. No pneumothorax. 2. Patchy consolidation in the peripheral right lower lobe, compatible with pulmonary contusion. 3. Mild cardiomegaly. Two-vessel coronary atherosclerosis. 4. Mild diffuse interlobular septal thickening, suggesting mild pulmonary edema. 5. Mosaic attenuation throughout both lungs, a nonspecific finding that can be due to air trapping from small airways disease or mosaic perfusion from pulmonary vascular disease. 6. Ascending thoracic aortic 4.5 cm aneurysm. Ascending thoracic aortic aneurysm.  06/06/19: Patient was seen and examined at his bedside this morning.  Failed home O2 evaluation yesterday 06/05/19 with O2 saturation in the 80s.  Patient not on oxygen supplementation at this time.  He wants to go home.  Patient is currently not appropriate for discharge at this time.  Needs hospitalization for pain control and to continue working with incentive spirometer in order to improve his oxygenation.  Discussed with nephrology possible hemodialysis on 06/07/2019, to keep his schedule TTS.   10/06/20Martin Robinson to see the patient in his room this am. All dressed. He stated that he was leaving regardless of what we said to him and walked away.   Hospital Course:  Principal Problem:   Fall at home, initial encounter Active Problems:   Essential hypertension  ESRD on dialysis [facility])   Right rib fracture   Lung contusion   Acute respiratory failure with hypoxia ([facility])   Anemia in ESRD  (end-stage renal disease) ([facility])   GERD (gastroesophageal reflux disease)   Chronic diastolic (congestive) heart failure [facility])   Fall   Thoracic ascending aortic aneurysm ([facility])   Rib fractures  Right eighth and ninth rib fractures complicated by right pulmonary contusion, POA Independently reviewed CT chest done on admission which showed right eighth and ninth rib fracture and pulmonary contusion Currently on 3 L of oxygen by nasal cannula saturating 95% Not on O2 supplementation at home Obtain home O2 evaluation Repeat chest x-ray at 1300 DC IV pain medications; avoid morphine in ESRD patients On oxycodone as needed for severe pain Continue bowel regimen to avoid opiate-induced constipation Optimize pain control and encourage incentive spirometer.  Acute hypoxic respiratory failure likely related to chest wall pain from rib fractures x2 Not on oxygen supplementation at baseline Currently on 3 L with O2 saturation 95% Home O2 evaluation 06/05/2019: Patient Saturations on Room Air at Rest =87% Patient Saturations on Room Air while Ambulating =83% Patient Saturations on3Liters of oxygen while Ambulating = 88-91% Continue continuous pulse oximetry Personally reviewed chest x-ray done on 06/05/2019 showed right lower lobe atelectasis, no sign of pneumothorax.   Thoracic aortic aneurysm measuring 4.5 cm No acute issues Recommend surveillance with repeat imaging outpatient. Blood pressure goal normotensive  ESRD on HD TTS Last HD on Saturday, 06/04/2019 Nephrology aware, possible HD tomorrow 06/07/2019  Asthma Stable Continue nebs  Essential hypertension Blood pressure is at goal Continue home Norvasc 10 mg daily, Coreg 12.5 mg twice daily, p.o. hydralazine 100 mg 3 times daily Continue to closely monitor vital signs  GERD Continue Protonix daily  Chronic diastolic CHF 2D echo done on 05/17/2019 showed LVEF greater than 65 with grade 1 diastolic dysfunction Volume  managed by hemodialysis  Anemia of chronic disease in the setting of ESRD Remaining stable Continue to monitor    DVT ppx: SCD Code Status:Full code Family Communication: None at bed side   Disposition Plan: Possible discharge to home with home health services when hemodynamically stable and general surgery signs off.  Consults called: General surgery, nephrology   Discharge Exam: BP (!) 164/58 (BP Location: Left Leg)    Pulse 70    Temp 98.3 F (36.8 C) (Oral)    Resp 18    Ht 5\' 6"  (1.676 m)    Wt 67.1 kg    SpO2 93%    BMI 23.88 kg/m   General: 58 y.o. year-old male well developed well nourished. In a hurry to leave the hospital against medical advice.  Psychiatry: Mood is irritable.  Discharge Instructions You were cared for by a hospitalist during your hospital stay. If you have any questions about your discharge medications or the care you received while you were in the hospital after you are discharged, you can call the unit and asked to speak with the hospitalist on call if the hospitalist that took care of you is not available. Once you are discharged, your primary care physician will handle any further medical issues. Please note that NO REFILLS for any discharge medications will be authorized once you are discharged, as it is imperative that you return to your primary care physician (or establish a relationship with a primary care physician if you do not have one) for your aftercare needs so that they can reassess your need for medications and monitor your  lab values.   Allergies as of 06/07/2019   No Known Allergies     Medication List    ASK your doctor about these medications   acetaminophen 500 MG tablet Commonly known as: TYLENOL Take 1,000 mg by mouth every 6 (six) hours as needed for moderate pain.   albuterol 108 (90 Base) MCG/ACT inhaler Commonly known as: VENTOLIN HFA Inhale 1-2 puffs into the lungs every 6 (six) hours as needed for wheezing  or shortness of breath.   amLODipine 10 MG tablet Commonly known as: NORVASC Take 1 tablet (10 mg total) by mouth daily.   aspirin 81 MG EC tablet Take 1 tablet (81 mg total) by mouth daily.   budesonide-formoterol 80-4.5 MCG/ACT inhaler Commonly known as: Symbicort Inhale 2 puffs into the lungs 2 (two) times daily.   carvedilol 12.5 MG tablet Commonly known as: COREG Take 1 tablet (12.5 mg total) by mouth 2 (two) times daily with a meal.   cinacalcet 30 MG tablet Commonly known as: SENSIPAR Take 30 mg by mouth daily with supper. Do not take less than 12 hours prior to dialysis   Durezol 0.05 % Emul Generic drug: Difluprednate Place 1 drop into the right eye See admin instructions. Start 3 days before procedure   gabapentin 300 MG capsule Commonly known as: NEURONTIN Take 300 mg by mouth at bedtime.   gatifloxacin 0.5 % Soln Commonly known as: ZYMAXID Place 1 drop into the right eye See admin instructions. Start 3 days before procedure   hydrALAZINE 100 MG tablet Commonly known as: APRESOLINE Take 1 tablet (100 mg total) by mouth every 8 (eight) hours.   multivitamin Tabs tablet Take 1 tablet by mouth daily.   pantoprazole 40 MG tablet Commonly known as: PROTONIX Take 40 mg by mouth daily.   polyethylene glycol 17 g packet Commonly known as: MIRALAX / GLYCOLAX Take 17 g by mouth daily as needed.   rOPINIRole 0.5 MG tablet Commonly known as: REQUIP Take 0.5 mg by mouth 2 (two) times daily.   sevelamer 800 MG tablet Commonly known as: RENAGEL Take 800-1,600 mg by mouth See admin instructions. 1,600mg  by mouth three times daily and 800mg  twice daily with snacks   traMADol-acetaminophen 37.5-325 MG tablet Commonly known as: ULTRACET Take 1 tablet by mouth every 6 (six) hours as needed. for pain      No Known Allergies Follow-up Information    Thomas Rakes, MD Follow up in 7 day(s).   Specialty: Family Medicine Contact information: [place] [address] [phone]            The results of significant diagnostics from this hospitalization (including imaging, microbiology, ancillary and laboratory) are listed below for reference.    Significant Diagnostic Studies: Ct Abdomen Pelvis Wo Contrast  Result Date: 06/04/2019 CLINICAL DATA:  Trip and fall with right sided injury against the corner of box spring. Right sided pain. Back pain. EXAM: CT CHEST, ABDOMEN AND PELVIS WITHOUT CONTRAST TECHNIQUE: Multidetector CT imaging of the chest, abdomen and pelvis was performed following the standard protocol without IV contrast. COMPARISON:  Chest radiograph from earlier today. FINDINGS: CT CHEST FINDINGS Cardiovascular: Mild cardiomegaly. No significant pericardial effusion/thickening. Left anterior descending and left circumflex coronary atherosclerosis. Atherosclerotic thoracic aorta with 4.5 cm ascending thoracic aortic aneurysm. No evidence of acute intramural hematoma in the thoracic aorta. Top-normal caliber main pulmonary artery (3.2 cm diameter). Mediastinum/Nodes: Hypodense 1.0 cm posterior left thyroid lobe nodule. Unremarkable esophagus. No pathologically enlarged axillary, mediastinal  or hilar lymph nodes, noting limited sensitivity for the detection of hilar adenopathy on this noncontrast study. Lungs/Pleura: No pneumothorax. Minimal smooth with basilar left pleural thickening with trace dependent left pleural effusion. No right pleural effusion. Diffuse bronchial wall thickening. Patchy consolidation throughout peripheral basilar right lower lobe. Scattered parenchymal bands in basilar left lower lobe. Mosaic attenuation in both lungs. Mild diffuse interlobular septal thickening. No lung masses or significant pulmonary nodules. No central airway stenoses. Musculoskeletal: No aggressive appearing focal osseous lesions. Nondisplaced acute posterolateral right eighth and posterior right ninth rib fractures. Moderate thoracic  spondylosis. CT ABDOMEN PELVIS FINDINGS Hepatobiliary: Normal size liver. No liver mass. No appreciable liver laceration on this noncontrast scan. Normal gallbladder with no radiopaque cholelithiasis. No biliary ductal dilatation. Pancreas: Normal, with no mass or duct dilation. Spleen: Normal size. No mass. Adrenals/Urinary Tract: Normal adrenals. Symmetric severe bilateral renal cortical atrophy. No renal stones. No hydronephrosis. Scattered subcentimeter hypodense right renal cortical lesions are too small to characterize and require no follow-up. No contour deforming left renal lesions. Normal bladder. Stomach/Bowel: Normal non-distended stomach. Normal caliber small bowel with no small bowel wall thickening. Normal appendix. Normal large bowel with no diverticulosis, large bowel wall thickening or pericolonic fat stranding. Vascular/Lymphatic: Atherosclerotic nonaneurysmal abdominal aorta. No pathologically enlarged lymph nodes in the abdomen or pelvis. Reproductive: Top-normal size prostate. Other: No pneumoperitoneum, ascites or focal fluid collection. Small fat containing umbilical hernia. Musculoskeletal: No aggressive appearing focal osseous lesions. No fracture in the abdomen or pelvis. Moderate lumbar spondylosis. IMPRESSION: 1. Acute posterior right eighth and ninth rib fractures. No pneumothorax. 2. Patchy consolidation in the peripheral right lower lobe, compatible with pulmonary contusion. 3. Mild cardiomegaly.  Two-vessel coronary atherosclerosis. 4. Mild diffuse interlobular septal thickening, suggesting mild pulmonary edema. 5. Mosaic attenuation throughout both lungs, a nonspecific finding that can be due to air trapping from small airways disease or mosaic perfusion from pulmonary vascular disease. 6. Ascending thoracic aortic 4.5 cm aneurysm. Ascending thoracic aortic aneurysm. Recommend semi-annual imaging followup by CTA or MRA and referral to cardiothoracic surgery if not already obtained.  This recommendation follows 2010 ACCF/AHA/AATS/ACR/ASA/SCA/SCAI/SIR/STS/SVM Guidelines for the Diagnosis and Management of Patients With Thoracic Aortic Disease. Circulation. 2010; 121: I433-I951. Aortic aneurysm NOS (ICD10-I71.9). 7. No evidence of acute traumatic injury in the abdomen or pelvis on this noncontrast scan. Electronically Signed   By: Ilona Sorrel M.D.   On: 06/04/2019 17:25   Dg Chest 2 View  Result Date: 06/04/2019 CLINICAL DATA:  Trip and fall, right-sided rib pain EXAM: CHEST - 2 VIEW COMPARISON:  05/16/2019 FINDINGS: Cardiomegaly. Pulmonary vascular prominence without overt edema. There is a minimally displaced fracture of the posterior right sixth rib. IMPRESSION: 1. There is a minimally displaced fracture of the posterior right sixth rib. No pneumothorax. 2. Cardiomegaly and pulmonary vascular prominence without overt pulmonary edema. No focal airspace opacity. Electronically Signed   By: Eddie Candle M.D.   On: 06/04/2019 16:07   Dg Chest 2 View  Result Date: 05/16/2019 CLINICAL DATA:  Pt here from home with c/o chest pressure non radiating , with chronic sob , pt states that the pain was worse on Saturday , EXAM: CHEST - 2 VIEW COMPARISON:  05/06/2019 FINDINGS: Cardiac silhouette is mildly enlarged. No mediastinal or hilar masses. No evidence of adenopathy. Lungs are hyperexpanded. There are thickened interstitial markings bilaterally including thickening of the fissures, increased when compared to the prior exam. No areas of lung consolidation. No pleural effusion or pneumothorax. Skeletal structures are intact.  IMPRESSION: 1. Findings consistent with mild congestive heart failure with interstitial pulmonary edema. No evidence of pneumonia. Electronically Signed   By: Lajean Manes M.D.   On: 05/16/2019 12:50   Ct Chest Wo Contrast  Result Date: 06/04/2019 CLINICAL DATA:  Trip and fall with right sided injury against the corner of box spring. Right sided pain. Back pain. EXAM: CT  CHEST, ABDOMEN AND PELVIS WITHOUT CONTRAST TECHNIQUE: Multidetector CT imaging of the chest, abdomen and pelvis was performed following the standard protocol without IV contrast. COMPARISON:  Chest radiograph from earlier today. FINDINGS: CT CHEST FINDINGS Cardiovascular: Mild cardiomegaly. No significant pericardial effusion/thickening. Left anterior descending and left circumflex coronary atherosclerosis. Atherosclerotic thoracic aorta with 4.5 cm ascending thoracic aortic aneurysm. No evidence of acute intramural hematoma in the thoracic aorta. Top-normal caliber main pulmonary artery (3.2 cm diameter). Mediastinum/Nodes: Hypodense 1.0 cm posterior left thyroid lobe nodule. Unremarkable esophagus. No pathologically enlarged axillary, mediastinal or hilar lymph nodes, noting limited sensitivity for the detection of hilar adenopathy on this noncontrast study. Lungs/Pleura: No pneumothorax. Minimal smooth with basilar left pleural thickening with trace dependent left pleural effusion. No right pleural effusion. Diffuse bronchial wall thickening. Patchy consolidation throughout peripheral basilar right lower lobe. Scattered parenchymal bands in basilar left lower lobe. Mosaic attenuation in both lungs. Mild diffuse interlobular septal thickening. No lung masses or significant pulmonary nodules. No central airway stenoses. Musculoskeletal: No aggressive appearing focal osseous lesions. Nondisplaced acute posterolateral right eighth and posterior right ninth rib fractures. Moderate thoracic spondylosis. CT ABDOMEN PELVIS FINDINGS Hepatobiliary: Normal size liver. No liver mass. No appreciable liver laceration on this noncontrast scan. Normal gallbladder with no radiopaque cholelithiasis. No biliary ductal dilatation. Pancreas: Normal, with no mass or duct dilation. Spleen: Normal size. No mass. Adrenals/Urinary Tract: Normal adrenals. Symmetric severe bilateral renal cortical atrophy. No renal stones. No  hydronephrosis. Scattered subcentimeter hypodense right renal cortical lesions are too small to characterize and require no follow-up. No contour deforming left renal lesions. Normal bladder. Stomach/Bowel: Normal non-distended stomach. Normal caliber small bowel with no small bowel wall thickening. Normal appendix. Normal large bowel with no diverticulosis, large bowel wall thickening or pericolonic fat stranding. Vascular/Lymphatic: Atherosclerotic nonaneurysmal abdominal aorta. No pathologically enlarged lymph nodes in the abdomen or pelvis. Reproductive: Top-normal size prostate. Other: No pneumoperitoneum, ascites or focal fluid collection. Small fat containing umbilical hernia. Musculoskeletal: No aggressive appearing focal osseous lesions. No fracture in the abdomen or pelvis. Moderate lumbar spondylosis. IMPRESSION: 1. Acute posterior right eighth and ninth rib fractures. No pneumothorax. 2. Patchy consolidation in the peripheral right lower lobe, compatible with pulmonary contusion. 3. Mild cardiomegaly.  Two-vessel coronary atherosclerosis. 4. Mild diffuse interlobular septal thickening, suggesting mild pulmonary edema. 5. Mosaic attenuation throughout both lungs, a nonspecific finding that can be due to air trapping from small airways disease or mosaic perfusion from pulmonary vascular disease. 6. Ascending thoracic aortic 4.5 cm aneurysm. Ascending thoracic aortic aneurysm. Recommend semi-annual imaging followup by CTA or MRA and referral to cardiothoracic surgery if not already obtained. This recommendation follows 2010 ACCF/AHA/AATS/ACR/ASA/SCA/SCAI/SIR/STS/SVM Guidelines for the Diagnosis and Management of Patients With Thoracic Aortic Disease. Circulation. 2010; 121: X324-M010. Aortic aneurysm NOS (ICD10-I71.9). 7. No evidence of acute traumatic injury in the abdomen or pelvis on this noncontrast scan. Electronically Signed   By: Ilona Sorrel M.D.   On: 06/04/2019 17:25   Dg Chest Port 1  View  Result Date: 06/05/2019 CLINICAL DATA:  Contusion EXAM: PORTABLE CHEST 1 VIEW COMPARISON:  Chest radiograph from one day  prior. FINDINGS: Stable cardiomediastinal silhouette with top-normal heart size. No pneumothorax. No pleural effusion. Patchy opacity at the right lung base is slightly increased. Stable curvilinear left lung base opacities. No pulmonary edema. IMPRESSION: 1. No pneumothorax. 2. Patchy right lung base opacity, slightly increased, favor a combination of contusion and atelectasis. 3. Stable left lung base scarring versus atelectasis. Electronically Signed   By: Ilona Sorrel M.D.   On: 06/05/2019 10:36    Microbiology: Recent Results (from the past 240 hour(s))  SARS Coronavirus 2 [facility] order, Performed in [facility] hospital lab) Nasopharyngeal Nasopharyngeal Swab     Status: None   Collection Time: 06/04/19  5:58 PM   Specimen: Nasopharyngeal Swab  Result Value Ref Range Status   SARS Coronavirus 2 NEGATIVE NEGATIVE Final    Comment: (NOTE) If result is NEGATIVE SARS-CoV-2 target nucleic acids are NOT DETECTED. The SARS-CoV-2 RNA is generally detectable in upper and lower  respiratory specimens during the acute phase of infection. The lowest  concentration of SARS-CoV-2 viral copies this assay can detect is 250  copies / mL. A negative result does not preclude SARS-CoV-2 infection  and should not be used as the sole basis for treatment or other  patient management decisions.  A negative result may occur with  improper specimen collection / handling, submission of specimen other  than nasopharyngeal swab, presence of viral mutation(s) within the  areas targeted by this assay, and inadequate number of viral copies  (<250 copies / mL). A negative result must be combined with clinical  observations, patient history, and epidemiological information. If result is POSITIVE SARS-CoV-2 target nucleic acids are DETECTED. The SARS-CoV-2 RNA is generally detectable in  upper and lower  respiratory specimens dur ing the acute phase of infection.  Positive  results are indicative of active infection with SARS-CoV-2.  Clinical  correlation with patient history and other diagnostic information is  necessary to determine patient infection status.  Positive results do  not rule out bacterial infection or co-infection with other viruses. If result is PRESUMPTIVE POSTIVE SARS-CoV-2 nucleic acids MAY BE PRESENT.   A presumptive positive result was obtained on the submitted specimen  and confirmed on repeat testing.  While 2019 novel coronavirus  (SARS-CoV-2) nucleic acids may be present in the submitted sample  additional confirmatory testing may be necessary for epidemiological  and / or clinical management purposes  to differentiate between  SARS-CoV-2 and other Sarbecovirus currently known to infect humans.  If clinically indicated additional testing with an alternate test  methodology [id number]) is advised. The SARS-CoV-2 RNA is generally  detectable in upper and lower respiratory sp ecimens during the acute  phase of infection. The expected result is Negative. Fact Sheet for Patients:  StrictlyIdeas.no Fact Sheet for Healthcare Providers: BankingDealers.co.za This test is not yet approved or cleared by the [place] FDA and has been authorized for detection and/or diagnosis of SARS-CoV-2 by FDA under an Emergency Use Authorization (EUA).  This EUA will remain in effect (meaning this test can be used) for the duration of the COVID-19 declaration under Section 564(b)(1) of the Act, 21 U.S.C. section 360bbb-3(b)(1), unless the authorization is terminated or revoked sooner. Performed at [facility], [place] [address], [place], [place] [address]      Labs: Basic Metabolic Panel: Recent Labs  Lab 06/04/19 1836  NA 137  K 5.0  CL 96*  CO2 25  GLUCOSE 104*  BUN 27*  CREATININE  5.78*  CALCIUM 8.7*   Liver  Function Tests: No results for input(s): AST, ALT, ALKPHOS, BILITOT, PROT, ALBUMIN in the last 168 hours. No results for input(s): LIPASE, AMYLASE in the last 168 hours. No results for input(s): AMMONIA in the last 168 hours. CBC: Recent Labs  Lab 06/04/19 1836  WBC 5.6  NEUTROABS 4.5  HGB 9.6*  HCT 29.5*  MCV 112.2*  PLT 149*   Cardiac Enzymes: No results for input(s): CKTOTAL, CKMB, CKMBINDEX, TROPONINI in the last 168 hours. BNP: BNP (last 3 results) Recent Labs    05/16/19 1211  BNP >4,500.0*    ProBNP (last 3 results) No results for input(s): PROBNP in the last 8760 hours.  CBG: No results for input(s): GLUCAP in the last 168 hours.     Signed:  Kayleen Memos, MD [facility] 06/10/2019, 7:20 AM

## 2019-06-10 NOTE — Progress Notes (Signed)
Cardiology Office Note:    Date:  06/11/2019   ID:  Thomas Robinson, DOB June 13, 1961, MRN [id number]  PCP:  Charlott Rakes, MD  Cardiologist:  No primary care provider on file.  Electrophysiologist:  None   Referring MD: Charlott Rakes, MD   Chief Complaint  Patient presents with  . Coronary Artery Disease    History of Present Illness:    Thomas Robinson is a 58 y.o. male with a hx of ESRD on hemodialysis(Tuesdays, Thursday, Saturday) hypertension, asthma, CVA, diastolic heart failure with recent ejection fraction reported as greater than 65%, anemia chronic disease who presented to establish cardiac care posthospitalization.  The patient was hospitalized at [facility] and discharged June 07, 2019.  His hospitalization was as result of fall and fractured ribs.  Diagnostic testing incidentally revealed thoracic aortic aneurysm 4.5 cm, with report of two-vessel coronary atherosclerosis.  Today the patient denies any chest pain, shortness of breath, nausea, vomiting.  He tells me that the his admission to the hospital he did have some chest pain but this is resolved.  Past Medical History:  Diagnosis Date  . Acute kidney injury superimposed on CKD ([place]) 05/24/2017  . Acute on chronic diastolic CHF (congestive heart failure) ([place]) 04/13/2018  . AKI (acute kidney injury) ([place]) 04/10/2015  . Asthma   . Asthma, chronic, unspecified asthma severity, with acute exacerbation 10/23/2017  . Atypical chest pain 02/16/2018  . CAP (community acquired pneumonia) 10/23/2017  . Cerebral infarction due to thrombosis of cerebral artery ([place])   . Cerebrovascular accident (CVA) ([place])   . Chronic kidney disease   . CKD (chronic kidney disease), stage V ([place]) 05/24/2017  . ESRD (end stage renal disease) on dialysis ([place]) 02/16/2018  . Essential hypertension 04/09/2015  . GERD (gastroesophageal reflux disease) 06/04/2019  . GI bleeding 05/24/2017  . History of completed stroke   . Hyperlipidemia    . Hypertension   . Hypertensive urgency 05/24/2017  . Kidney failure   . Respiratory failure, acute ([place]) 10/23/2017  . Restless leg syndrome   . Stroke ([place])   . Symptomatic anemia 05/24/2017    Past Surgical History:  Procedure Laterality Date  . AV FISTULA PLACEMENT Left 05/28/2017   Procedure: LEFT ARM ARTERIOVENOUS (AV) FISTULA CREATION;  Surgeon: Conrad Bithlo, MD;  Location: [facility];  Service: Vascular;  Laterality: Left;  . IR FLUORO GUIDE CV LINE RIGHT  05/25/2017  . IR US GUIDE VASC ACCESS RIGHT  05/25/2017  . RINOPLASTY      Current Medications: Current Meds  Medication Sig  . acetaminophen (TYLENOL) 500 MG tablet Take 1,000 mg by mouth every 6 (six) hours as needed for moderate pain.  [name] albuterol (PROVENTIL HFA;VENTOLIN HFA) 108 (90 BASE) MCG/ACT inhaler Inhale 1-2 puffs into the lungs every 6 (six) hours as needed for wheezing or shortness of breath.  [name] amLODipine (NORVASC) 10 MG tablet Take 1 tablet (10 mg total) by mouth daily.  [name] aspirin EC 81 MG EC tablet Take 1 tablet (81 mg total) by mouth daily.  . budesonide-formoterol (SYMBICORT) 80-4.5 MCG/ACT inhaler Inhale 2 puffs into the lungs 2 (two) times daily.  . carvedilol (COREG) 12.5 MG tablet Take 1 tablet (12.5 mg total) by mouth 2 (two) times daily with a meal.  . cinacalcet (SENSIPAR) 30 MG tablet Take 30 mg by mouth daily with supper. Do not take less than 12 hours prior to dialysis  . DUREZOL 0.05 % EMUL Place 1 drop into the right eye  See admin instructions. Start 3 days before procedure  . gabapentin (NEURONTIN) 300 MG capsule Take 300 mg by mouth at bedtime.  [name] gatifloxacin (ZYMAXID) 0.5 % SOLN Place 1 drop into the right eye See admin instructions. Start 3 days before procedure  . hydrALAZINE (APRESOLINE) 100 MG tablet Take 1 tablet (100 mg total) by mouth every 8 (eight) hours.  . multivitamin (RENA-VIT) TABS tablet Take 1 tablet by mouth daily.  . pantoprazole (PROTONIX) 40 MG tablet Take 40 mg by mouth daily  as needed.   . sevelamer (RENAGEL) 800 MG tablet Take 800-1,600 mg by mouth See admin instructions. 1,600mg  by mouth three times daily and 800mg  twice daily with snacks     Allergies:   Patient has no known allergies.   Social History   Socioeconomic History  . Marital status: Single    Spouse name: Not on file  . Number of children: Not on file  . Years of education: Not on file  . Highest education level: Not on file  Occupational History  . Not on file  Social Needs  . Financial resource strain: Not on file  . Food insecurity    Worry: Not on file    Inability: Not on file  . Transportation needs    Medical: Not on file    Non-medical: Not on file  Tobacco Use  . Smoking status: Never Smoker  . Smokeless tobacco: Never Used  Substance and Sexual Activity  . Alcohol use: No    Alcohol/week: 0.0 standard drinks    Comment: has a drink once in a while- 07/2018  . Drug use: No  . Sexual activity: Not on file  Lifestyle  . Physical activity    Days per week: Not on file    Minutes per session: Not on file  . Stress: Not on file  Relationships  . Social Herbalist on phone: Not on file    Gets together: Not on file    Attends religious service: Not on file    Active member of club or organization: Not on file    Attends meetings of clubs or organizations: Not on file    Relationship status: Not on file  Other Topics Concern  . Not on file  Social History Narrative   Lives with a roommate in a one story home.  Has 1 child.  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  Education: high school.     Family History: The patient's family history includes Cancer in his mother.  ROS:   Review of Systems  Constitution: Negative for decreased appetite, fever and weight gain.  HENT: Negative for congestion, ear discharge, hoarse voice and sore throat.   Eyes: Negative for discharge, redness, vision loss in right eye and visual halos.  Cardiovascular: Negative for chest  pain, dyspnea on exertion, leg swelling, orthopnea and palpitations.  Respiratory: Negative for cough, hemoptysis, shortness of breath and snoring.   Endocrine: Negative for heat intolerance and polyphagia.  Hematologic/Lymphatic: Negative for bleeding problem. Does not bruise/bleed easily.  Skin: Negative for flushing, nail changes, rash and suspicious lesions.  Musculoskeletal: Negative for arthritis, joint pain, muscle cramps, myalgias, neck pain and stiffness.  Gastrointestinal: Negative for abdominal pain, bowel incontinence, diarrhea and excessive appetite.  Genitourinary: Negative for decreased libido, genital sores and incomplete emptying.  Neurological: Negative for brief paralysis, focal weakness, headaches and loss of balance.  Psychiatric/Behavioral: Negative for altered mental status, depression and suicidal ideas.  Allergic/Immunologic:  Negative for HIV exposure and persistent infections.    EKGs/Labs/Other Studies Reviewed:    The following studies were reviewed today:   EKG:  The ekg ordered today demonstrates sinus rhythm, heart rate 73 bpm left atrial enlargement with left ventricular atrophy and ST changes to be suggestive of electrolyte abnormalities as well as early repolarization similar to previous EKG on 06/06/2019.  CT of abd/pelvis and Chest: 1. Aute posterior right eighth and ninth rib fractures. No pneumothorax. 2. Patchy consolidation in the peripheral right lower lobe, compatible with pulmonary contusion. 3. Mild cardiomegaly. Two-vessel coronary atherosclerosis. 4. Mild diffuse interlobular septal thickening, suggesting mild pulmonary edema. 5. Mosaic attenuation throughout both lungs, a nonspecific finding that can be due to air trapping from small airways disease or mosaic perfusion from pulmonary vascular disease. 6. Ascending thoracic aortic 4.5 cm aneurysm. Ascending thoracic aortic aneurysm.   TTE 05/17/2019 IMPRESSIONS  1. The left ventricle has  hyperdynamic systolic function, with an ejection fraction of >65%. The cavity size was normal. There is severely increased left ventricular wall thickness. Left ventricular diastolic Doppler parameters are consistent with  impaired relaxation. Elevated left atrial and left ventricular end-diastolic pressures The E/e' is >15. No evidence of left ventricular regional wall motion abnormalities.  2. The right ventricle has normal systolic function. The cavity was normal. There is no increase in right ventricular wall thickness.  3. Left atrial size was severely dilated.  4. The pericardial effusion is anterior to the right ventricle.  5. Trivial pericardial effusion is present.  6. The mitral valve is abnormal. Mild thickening of the mitral valve leaflet.  7. The tricuspid valve is grossly normal.  8. The aortic valve is tricuspid. Mild sclerosis of the aortic valve. Aortic valve regurgitation is moderate by color flow Doppler.  9. The aorta is normal unless otherwise noted. 10. The inferior vena cava was dilated in size with >50% respiratory variability.  Recent Labs: 07/06/2018: ALT 13 05/16/2019: B Natriuretic Peptide >4,500.0 06/04/2019: BUN 27; Creatinine, Ser 5.78; Hemoglobin 9.6; Platelets 149; Potassium 5.0; Sodium 137   Recent Lipid Panel    Component Value Date/Time   CHOL 133 06/10/2019 0944   TRIG 59 06/10/2019 0944   HDL 46 06/10/2019 0944   CHOLHDL 2.9 06/10/2019 0944   CHOLHDL 2.8 02/17/2018 0345   VLDL 14 02/17/2018 0345   LDLCALC 74 06/10/2019 0944    Physical Exam:    VS:  BP (!) 180/70 (BP Location: Right Arm, Patient Position: Sitting, Cuff Size: Normal)   Pulse 70   Ht 5\' 6"  (1.676 m)   Wt 140 lb (63.5 kg)   SpO2 90%   BMI 22.60 kg/m     Wt Readings from Last 3 Encounters:  06/10/19 140 lb (63.5 kg)  06/06/19 147 lb 14.9 oz (67.1 kg)  05/18/19 130 lb 4.7 oz (59.1 kg)     GEN: Well nourished, well developed in no acute distress HEENT: Normal NECK: No JVD;  No carotid bruits LYMPHATICS: No lymphadenopathy CARDIAC: S1S2 noted,RRR, no murmurs, rubs, gallops RESPIRATORY:  Clear to auscultation without rales, wheezing or rhonchi  ABDOMEN: Soft, non-tender, non-distended, +bowel sounds, no guarding. EXTREMITIES: No edema, No cyanosis, no clubbing MUSCULOSKELETAL:  No edema; No deformity  SKIN: Warm and dry NEUROLOGIC:  Alert and oriented x 3, non-focal PSYCHIATRIC:  Normal affect, good insight  ASSESSMENT:    1. Thoracic ascending aortic aneurysm ([place])   2. Essential hypertension   3. Coronary artery disease with angina pectoris, unspecified vessel or lesion  type, unspecified whether native or transplanted heart ([place])   4. ESRD (end stage renal disease) ([place])   5. Mixed hyperlipidemia   6. Chronic diastolic heart failure ([place])   7. Diastolic dysfunction    PLAN:    Today patient has presenting symptoms.  His CAT scan does show evidence of 4.5 cm ascending thoracic aneurysm, for now I  will continue with aggressive blood pressure management. When appropriate I will repeat the CTA to monitor for progression of the ascending aneurysm.  He was hypertensive in the office today.  Tells me he had not taken his medication therefore prefers not to start any new medication or any increased medications.  Take his blood pressure daily and follow-up with me in 1 month.  Coronary artery sclerosis was noted on his CT, he is currently on aspirin he denies any reaction/allergies  to statin therefore I am going to start patient on 20 mg atorvastatin daily.   The patient is in agreement with the above plan. The patient left the office in stable condition.  The patient will follow up in 1 month.   Medication Adjustments/Labs and Tests Ordered: Current medicines are reviewed at length with the patient today.  Concerns regarding medicines are outlined above.  Orders Placed This Encounter  Procedures  . Lipid Profile  . EKG 12-Lead   Meds ordered this  encounter  Medications  . atorvastatin (LIPITOR) 20 MG tablet    Sig: Take 1 tablet (20 mg total) by mouth daily.    Dispense:  90 tablet    Refill:  1    Patient Instructions  Medication Instructions:  Your physician has recommended you make the following change in your medication:   Start: Lipitor 20 mg daily   If you need a refill on your cardiac medications before your next appointment, please call your pharmacy.   Lab work: Your physician recommends that you return for lab work today: lipids    If you have labs (blood work) drawn today and your tests are completely normal, you will receive your results only by: [name] MyChart Message (if you have MyChart) OR . A paper copy in the mail If you have any lab test that is abnormal or we need to change your treatment, we will call you to review the results.  Testing/Procedures: None.   Follow-Up:  Follow up in 1 month   Any Other Special Instructions Will Be Listed Below (If Applicable).  Atorvastatin tablets What is this medicine? ATORVASTATIN (a TORE va sta tin) is known as a HMG-CoA reductase inhibitor or 'statin'. It lowers the level of cholesterol and triglycerides in the blood. This drug may also reduce the risk of heart attack, stroke, or other health problems in patients with risk factors for heart disease. Diet and lifestyle changes are often used with this drug. This medicine may be used for other purposes; ask your health care provider or pharmacist if you have questions. COMMON BRAND NAME(S): Lipitor What should I tell my health care provider before I take this medicine? They need to know if you have any of these conditions:  diabetes  if you often drink alcohol  history of stroke  kidney disease  liver disease  muscle aches or weakness  thyroid disease  an unusual or allergic reaction to atorvastatin, other medicines, foods, dyes, or preservatives  pregnant or trying to get pregnant  breast-feeding  How should I use this medicine? Take this medicine by mouth with a glass of water. Follow the  directions on the prescription label. You can take it with or without food. If it upsets your stomach, take it with food. Do not take with grapefruit juice. Take your medicine at regular intervals. Do not take it more often than directed. Do not stop taking except on your doctor's advice. Talk to your pediatrician regarding the use of this medicine in children. While this drug may be prescribed for children as young as 10 for selected conditions, precautions do apply. Overdosage: If you think you have taken too much of this medicine contact a poison control center or emergency room at once. NOTE: This medicine is only for you. Do not share this medicine with others. What if I miss a dose? If you miss a dose, take it as soon as you can. If your next dose is to be taken in less than 12 hours, then do not take the missed dose. Take the next dose at your regular time. Do not take double or extra doses. What may interact with this medicine? Do not take this medicine with any of the following medications:  dasabuvir; ombitasvir; paritaprevir; ritonavir  ombitasvir; paritaprevir; ritonavir  posaconazole  red yeast rice This medicine may also interact with the following medications:  alcohol  birth control pills  certain antibiotics like erythromycin and clarithromycin  certain antivirals for HIV or hepatitis  certain medicines for cholesterol like fenofibrate, gemfibrozil, and niacin  certain medicines for fungal infections like ketoconazole and itraconazole  colchicine  cyclosporine  digoxin  grapefruit juice  rifampin This list may not describe all possible interactions. Give your health care provider a list of all the medicines, herbs, non-prescription drugs, or dietary supplements you use. Also tell them if you smoke, drink alcohol, or use illegal drugs. Some items may interact with  your medicine. What should I watch for while using this medicine? Visit your doctor or health care professional for regular check-ups. You may need regular tests to make sure your liver is working properly. Your health care professional may tell you to stop taking this medicine if you develop muscle problems. If your muscle problems do not go away after stopping this medicine, contact your health care professional. Do not become pregnant while taking this medicine. Women should inform their health care professional if they wish to become pregnant or think they might be pregnant. There is a potential for serious side effects to an unborn child. Talk to your health care professional or pharmacist for more information. Do not breast-feed an infant while taking this medicine. This medicine may increase blood sugar. Ask your healthcare provider if changes in diet or medicines are needed if you have diabetes. If you are going to need surgery or other procedure, tell your doctor that you are using this medicine. This drug is only part of a total heart-health program. Your doctor or a dietician can suggest a low-cholesterol and low-fat diet to help. Avoid alcohol and smoking, and keep a proper exercise schedule. This medicine may cause a decrease in Co-Enzyme Q-10. You should make sure that you get enough Co-Enzyme Q-10 while you are taking this medicine. Discuss the foods you eat and the vitamins you take with your health care professional. What side effects may I notice from receiving this medicine? Side effects that you should report to your doctor or health care professional as soon as possible:  allergic reactions like skin rash, itching or hives, swelling of the face, lips, or tongue  fever  joint pain  loss of  memory  redness, blistering, peeling or loosening of the skin, including inside the mouth  signs and symptoms of high blood sugar such as being more thirsty or hungry or having to urinate  more than normal. You may also feel very tired or have blurry vision.  signs and symptoms of liver injury like dark yellow or brown urine; general ill feeling or flu-like symptoms; light-belly pain; unusually weak or tired; yellowing of the eyes or skin  signs and symptoms of muscle injury like dark urine; trouble passing urine or change in the amount of urine; unusually weak or tired; muscle pain or side or back pain Side effects that usually do not require medical attention (report to your doctor or health care professional if they continue or are bothersome):  diarrhea  nausea  stomach pain  trouble sleeping  upset stomach This list may not describe all possible side effects. Call your doctor for medical advice about side effects. You may report side effects to FDA at 1-800-FDA-1088. Where should I keep my medicine? Keep out of the reach of children. Store between 20 and 25 degrees C (68 and 77 degrees F). Throw away any unused medicine after the expiration date. NOTE: This sheet is a summary. It may not cover all possible information. If you have questions about this medicine, talk to your doctor, pharmacist, or health care provider.  2020 Elsevier/Gold Standard (2018-06-09 11:36:16)       Adopting a Healthy Lifestyle.  Know what a healthy weight is for you (roughly BMI <25) and aim to maintain this   Aim for 7+ servings of fruits and vegetables daily   65-80+ fluid ounces of water or unsweet tea for healthy kidneys   Limit to max 1 drink of alcohol per day; avoid smoking/tobacco   Limit animal fats in diet for cholesterol and heart health - choose grass fed whenever available   Avoid highly processed foods, and foods high in saturated/trans fats   Aim for low stress - take time to unwind and care for your mental health   Aim for 150 min of moderate intensity exercise weekly for heart health, and weights twice weekly for bone health   Aim for 7-9 hours of sleep daily    When it comes to diets, agreement about the perfect plan isnt easy to find, even among the experts. Experts at the [place] developed an idea known as the Healthy Eating Plate. Just imagine a plate divided into logical, healthy portions.   The emphasis is on diet quality:   Load up on vegetables and fruits - one-half of your plate: Aim for color and variety, and remember that potatoes dont count.   Go for whole grains - one-quarter of your plate: Whole wheat, barley, wheat berries, quinoa, oats, brown rice, and foods made with them. If you want pasta, go with whole wheat pasta.   Protein power - one-quarter of your plate: Fish, chicken, beans, and nuts are all healthy, versatile protein sources. Limit red meat.   The diet, however, does go beyond the plate, offering a few other suggestions.   Use healthy plant oils, such as olive, canola, soy, corn, sunflower and peanut. Check the labels, and avoid partially hydrogenated oil, which have unhealthy trans fats.   If youre thirsty, drink water. Coffee and tea are good in moderation, but skip sugary drinks and limit milk and dairy products to one or two daily servings.   The type of carbohydrate in the diet  is more important than the amount. Some sources of carbohydrates, such as vegetables, fruits, whole grains, and beans-are healthier than others.   Finally, stay active  Signed, [name], DO  06/11/2019 8:16 AM    [facility]

## 2019-06-10 NOTE — Patient Instructions (Addendum)
Medication Instructions:  Your physician has recommended you make the following change in your medication:   Start: Lipitor 20 mg daily   If you need a refill on your cardiac medications before your next appointment, please call your pharmacy.   Lab work: Your physician recommends that you return for lab work today: lipids    If you have labs (blood work) drawn today and your tests are completely normal, you will receive your results only by: [name] MyChart Message (if you have MyChart) OR . A paper copy in the mail If you have any lab test that is abnormal or we need to change your treatment, we will call you to review the results.  Testing/Procedures: None.   Follow-Up:  Follow up in 1 month   Any Other Special Instructions Will Be Listed Below (If Applicable).  Atorvastatin tablets What is this medicine? ATORVASTATIN (a TORE va sta tin) is known as a HMG-CoA reductase inhibitor or 'statin'. It lowers the level of cholesterol and triglycerides in the blood. This drug may also reduce the risk of heart attack, stroke, or other health problems in patients with risk factors for heart disease. Diet and lifestyle changes are often used with this drug. This medicine may be used for other purposes; ask your health care provider or pharmacist if you have questions. COMMON BRAND NAME(S): Lipitor What should I tell my health care provider before I take this medicine? They need to know if you have any of these conditions:  diabetes  if you often drink alcohol  history of stroke  kidney disease  liver disease  muscle aches or weakness  thyroid disease  an unusual or allergic reaction to atorvastatin, other medicines, foods, dyes, or preservatives  pregnant or trying to get pregnant  breast-feeding How should I use this medicine? Take this medicine by mouth with a glass of water. Follow the directions on the prescription label. You can take it with or without food. If it upsets  your stomach, take it with food. Do not take with grapefruit juice. Take your medicine at regular intervals. Do not take it more often than directed. Do not stop taking except on your doctor's advice. Talk to your pediatrician regarding the use of this medicine in children. While this drug may be prescribed for children as young as 10 for selected conditions, precautions do apply. Overdosage: If you think you have taken too much of this medicine contact a poison control center or emergency room at once. NOTE: This medicine is only for you. Do not share this medicine with others. What if I miss a dose? If you miss a dose, take it as soon as you can. If your next dose is to be taken in less than 12 hours, then do not take the missed dose. Take the next dose at your regular time. Do not take double or extra doses. What may interact with this medicine? Do not take this medicine with any of the following medications:  dasabuvir; ombitasvir; paritaprevir; ritonavir  ombitasvir; paritaprevir; ritonavir  posaconazole  red yeast rice This medicine may also interact with the following medications:  alcohol  birth control pills  certain antibiotics like erythromycin and clarithromycin  certain antivirals for HIV or hepatitis  certain medicines for cholesterol like fenofibrate, gemfibrozil, and niacin  certain medicines for fungal infections like ketoconazole and itraconazole  colchicine  cyclosporine  digoxin  grapefruit juice  rifampin This list may not describe all possible interactions. Give your health care provider a  list of all the medicines, herbs, non-prescription drugs, or dietary supplements you use. Also tell them if you smoke, drink alcohol, or use illegal drugs. Some items may interact with your medicine. What should I watch for while using this medicine? Visit your doctor or health care professional for regular check-ups. You may need regular tests to make sure your liver  is working properly. Your health care professional may tell you to stop taking this medicine if you develop muscle problems. If your muscle problems do not go away after stopping this medicine, contact your health care professional. Do not become pregnant while taking this medicine. Women should inform their health care professional if they wish to become pregnant or think they might be pregnant. There is a potential for serious side effects to an unborn child. Talk to your health care professional or pharmacist for more information. Do not breast-feed an infant while taking this medicine. This medicine may increase blood sugar. Ask your healthcare provider if changes in diet or medicines are needed if you have diabetes. If you are going to need surgery or other procedure, tell your doctor that you are using this medicine. This drug is only part of a total heart-health program. Your doctor or a dietician can suggest a low-cholesterol and low-fat diet to help. Avoid alcohol and smoking, and keep a proper exercise schedule. This medicine may cause a decrease in Co-Enzyme Q-10. You should make sure that you get enough Co-Enzyme Q-10 while you are taking this medicine. Discuss the foods you eat and the vitamins you take with your health care professional. What side effects may I notice from receiving this medicine? Side effects that you should report to your doctor or health care professional as soon as possible:  allergic reactions like skin rash, itching or hives, swelling of the face, lips, or tongue  fever  joint pain  loss of memory  redness, blistering, peeling or loosening of the skin, including inside the mouth  signs and symptoms of high blood sugar such as being more thirsty or hungry or having to urinate more than normal. You may also feel very tired or have blurry vision.  signs and symptoms of liver injury like dark yellow or brown urine; general ill feeling or flu-like symptoms;  light-belly pain; unusually weak or tired; yellowing of the eyes or skin  signs and symptoms of muscle injury like dark urine; trouble passing urine or change in the amount of urine; unusually weak or tired; muscle pain or side or back pain Side effects that usually do not require medical attention (report to your doctor or health care professional if they continue or are bothersome):  diarrhea  nausea  stomach pain  trouble sleeping  upset stomach This list may not describe all possible side effects. Call your doctor for medical advice about side effects. You may report side effects to FDA at 1-800-FDA-1088. Where should I keep my medicine? Keep out of the reach of children. Store between 20 and 25 degrees C (68 and 77 degrees F). Throw away any unused medicine after the expiration date. NOTE: This sheet is a summary. It may not cover all possible information. If you have questions about this medicine, talk to your doctor, pharmacist, or health care provider.  2020 Elsevier/Gold Standard (2018-06-09 11:36:16)

## 2019-06-11 ENCOUNTER — Encounter: Payer: Self-pay | Admitting: Cardiology

## 2019-06-11 DIAGNOSIS — E876 Hypokalemia: Secondary | ICD-10-CM | POA: Diagnosis not present

## 2019-06-11 DIAGNOSIS — N2581 Secondary hyperparathyroidism of renal origin: Secondary | ICD-10-CM | POA: Diagnosis not present

## 2019-06-11 DIAGNOSIS — Z992 Dependence on renal dialysis: Secondary | ICD-10-CM | POA: Diagnosis not present

## 2019-06-11 DIAGNOSIS — D631 Anemia in chronic kidney disease: Secondary | ICD-10-CM | POA: Diagnosis not present

## 2019-06-11 DIAGNOSIS — N186 End stage renal disease: Secondary | ICD-10-CM | POA: Diagnosis not present

## 2019-06-11 DIAGNOSIS — D509 Iron deficiency anemia, unspecified: Secondary | ICD-10-CM | POA: Diagnosis not present

## 2019-06-11 LAB — LIPID PANEL
Chol/HDL Ratio: 2.9 ratio (ref 0.0–5.0)
Cholesterol, Total: 133 mg/dL (ref 100–199)
HDL: 46 mg/dL (ref >39)
LDL Chol Calc (NIH): 74 mg/dL (ref 0–99)
Triglycerides: 59 mg/dL (ref 0–149)
VLDL Cholesterol Cal: 13 mg/dL (ref 5–40)

## 2019-06-13 ENCOUNTER — Other Ambulatory Visit: Payer: Self-pay | Admitting: Nephrology

## 2019-06-13 DIAGNOSIS — I712 Thoracic aortic aneurysm, without rupture, unspecified: Secondary | ICD-10-CM

## 2019-06-14 DIAGNOSIS — Z992 Dependence on renal dialysis: Secondary | ICD-10-CM | POA: Diagnosis not present

## 2019-06-14 DIAGNOSIS — N186 End stage renal disease: Secondary | ICD-10-CM | POA: Diagnosis not present

## 2019-06-14 DIAGNOSIS — E876 Hypokalemia: Secondary | ICD-10-CM | POA: Diagnosis not present

## 2019-06-14 DIAGNOSIS — D631 Anemia in chronic kidney disease: Secondary | ICD-10-CM | POA: Diagnosis not present

## 2019-06-14 DIAGNOSIS — N2581 Secondary hyperparathyroidism of renal origin: Secondary | ICD-10-CM | POA: Diagnosis not present

## 2019-06-14 DIAGNOSIS — D509 Iron deficiency anemia, unspecified: Secondary | ICD-10-CM | POA: Diagnosis not present

## 2019-06-15 ENCOUNTER — Ambulatory Visit: Payer: Medicare Other | Admitting: Cardiovascular Disease

## 2019-06-15 NOTE — Progress Notes (Deleted)
Hypertension Clinic Initial Assessment:    Date:  06/15/2019   ID:  Thomas Robinson, DOB Jun 07, 1961, MRN [id number]  PCP:  Charlott Rakes, MD  Cardiologist:  [name], DO  Nephrologist:  Referring MD: Charlott Rakes, MD   CC: Hypertension  History of Present Illness:    Thomas Robinson is a 58 y.o. male with a hx of hypertension, chronic diastolic heart failure, moderate ascending aortic aneurysm, GERD, CVA, ESRD on HD, asthma, and hyperlipidemia here to establish care in the hypertension clinic.  He was recently admitted to the hospital 06/2019 after a fall at home that led to rib fractures and lung contusion.  This was complicated by hypoxic respiratory failure.  He required hospitalization for pain control and for incentive spirometry.  However he left the hospital the following day AMA.  During hospitalization his blood pressure was poorly-controlled.  He was incidentally noted to have a moderate ascending aortic aneurysm.  He followed up with Dr. [name] on 10/9, at which time his blood pressure was 180/70.  He had not taken his medications at that time so no changes were made.  He had an echocardiogram 05/17/2019 that revealed LVEF greater than 65% with severe LVH and moderate aortic regurgitation.  Right atrial pressure was 8 mmHg.  Amlodipine 10 mg daily Carvedilol 12.5 mg twice daily Hydralazine 100 mg 3 times daily  Previous antihypertensives:  Past Medical History:  Diagnosis Date  . Acute kidney injury superimposed on CKD ([place]) 05/24/2017  . Acute on chronic diastolic CHF (congestive heart failure) ([place]) 04/13/2018  . AKI (acute kidney injury) ([place]) 04/10/2015  . Asthma   . Asthma, chronic, unspecified asthma severity, with acute exacerbation 10/23/2017  . Atypical chest pain 02/16/2018  . CAP (community acquired pneumonia) 10/23/2017  . Cerebral infarction due to thrombosis of cerebral artery ([place])   . Cerebrovascular accident (CVA) ([place])   . Chronic kidney  disease   . CKD (chronic kidney disease), stage V ([place]) 05/24/2017  . ESRD (end stage renal disease) on dialysis ([place]) 02/16/2018  . Essential hypertension 04/09/2015  . GERD (gastroesophageal reflux disease) 06/04/2019  . GI bleeding 05/24/2017  . History of completed stroke   . Hyperlipidemia   . Hypertension   . Hypertensive urgency 05/24/2017  . Kidney failure   . Respiratory failure, acute ([place]) 10/23/2017  . Restless leg syndrome   . Stroke ([place])   . Symptomatic anemia 05/24/2017    Past Surgical History:  Procedure Laterality Date  . AV FISTULA PLACEMENT Left 05/28/2017   Procedure: LEFT ARM ARTERIOVENOUS (AV) FISTULA CREATION;  Surgeon: Conrad Retsof, MD;  Location: [facility];  Service: Vascular;  Laterality: Left;  . IR FLUORO GUIDE CV LINE RIGHT  05/25/2017  . IR US GUIDE VASC ACCESS RIGHT  05/25/2017  . RINOPLASTY      Current Medications: No outpatient medications have been marked as taking for the 06/15/19 encounter (Appointment) with Skeet Latch, MD.     Allergies:   Patient has no known allergies.   Social History   Socioeconomic History  . Marital status: Single    Spouse name: Not on file  . Number of children: Not on file  . Years of education: Not on file  . Highest education level: Not on file  Occupational History  . Not on file  Social Needs  . Financial resource strain: Not on file  . Food insecurity    Worry: Not on file    Inability: Not on file  .  Transportation needs    Medical: Not on file    Non-medical: Not on file  Tobacco Use  . Smoking status: Never Smoker  . Smokeless tobacco: Never Used  Substance and Sexual Activity  . Alcohol use: No    Alcohol/week: 0.0 standard drinks    Comment: has a drink once in a while- 07/2018  . Drug use: No  . Sexual activity: Not on file  Lifestyle  . Physical activity    Days per week: Not on file    Minutes per session: Not on file  . Stress: Not on file  Relationships  . Social Product manager on phone: Not on file    Gets together: Not on file    Attends religious service: Not on file    Active member of club or organization: Not on file    Attends meetings of clubs or organizations: Not on file    Relationship status: Not on file  Other Topics Concern  . Not on file  Social History Narrative   Lives with a roommate in a one story home.  Has 1 child.  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  Education: high school.     Family History: The patient's ***family history includes Cancer in his mother.  ROS:   Please see the history of present illness.    *** All other systems reviewed and are negative.  EKGs/Labs/Other Studies Reviewed:    EKG:  EKG is *** ordered today.  The ekg ordered today demonstrates ***  TTE 05/17/2019 IMPRESSIONS 1. The left ventricle has hyperdynamic systolic function, with an ejection fraction of >65%. The cavity size was normal. There is severely increased left ventricular wall thickness. Left ventricular diastolic Doppler parameters are consistent with  impaired relaxation. Elevated left atrial and left ventricular end-diastolic pressures The E/e' is >15. No evidence of left ventricular regional wall motion abnormalities. 2. The right ventricle has normal systolic function. The cavity was normal. There is no increase in right ventricular wall thickness. 3. Left atrial size was severely dilated. 4. The pericardial effusion is anterior to the right ventricle. 5. Trivial pericardial effusion is present. 6. The mitral valve is abnormal. Mild thickening of the mitral valve leaflet. 7. The tricuspid valve is grossly normal. 8. The aortic valve is tricuspid. Mild sclerosis of the aortic valve. Aortic valve regurgitation is moderate by color flow Doppler. 9. The aorta is normal unless otherwise noted. 10. The inferior vena cava was dilated in size with >50% respiratory variability. Recent Labs: 07/06/2018: ALT 13 05/16/2019: B Natriuretic  Peptide >4,500.0 06/04/2019: BUN 27; Creatinine, Ser 5.78; Hemoglobin 9.6; Platelets 149; Potassium 5.0; Sodium 137   Recent Lipid Panel    Component Value Date/Time   CHOL 133 06/10/2019 0944   TRIG 59 06/10/2019 0944   HDL 46 06/10/2019 0944   CHOLHDL 2.9 06/10/2019 0944   CHOLHDL 2.8 02/17/2018 0345   VLDL 14 02/17/2018 0345   LDLCALC 74 06/10/2019 0944    Physical Exam:    VS:  There were no vitals taken for this visit.    Wt Readings from Last 3 Encounters:  06/10/19 140 lb (63.5 kg)  06/06/19 147 lb 14.9 oz (67.1 kg)  05/18/19 130 lb 4.7 oz (59.1 kg)     GEN: *** Well nourished, well developed in no acute distress HEENT: Normal NECK: No JVD; No carotid bruits LYMPHATICS: No lymphadenopathy CARDIAC: ***RRR, no murmurs, rubs, gallops RESPIRATORY:  Clear to auscultation without rales,  wheezing or rhonchi  ABDOMEN: Soft, non-tender, non-distended MUSCULOSKELETAL:  No edema; No deformity  SKIN: Warm and dry NEUROLOGIC:  Alert and oriented x 3 PSYCHIATRIC:  Normal affect   ASSESSMENT:    No diagnosis found.  PLAN:    1. ***   Disposition:    FU with MD/PharmD in {gen number [id number]} {Days to years:10300} {virtual or in-office}   Medication Adjustments/Labs and Tests Ordered: Current medicines are reviewed at length with the patient today.  Concerns regarding medicines are outlined above.  No orders of the defined types were placed in this encounter.  No orders of the defined types were placed in this encounter.    Signed, Skeet Latch, MD  06/15/2019 8:22 AM    [facility]

## 2019-06-16 DIAGNOSIS — N2581 Secondary hyperparathyroidism of renal origin: Secondary | ICD-10-CM | POA: Diagnosis not present

## 2019-06-16 DIAGNOSIS — D509 Iron deficiency anemia, unspecified: Secondary | ICD-10-CM | POA: Diagnosis not present

## 2019-06-16 DIAGNOSIS — E876 Hypokalemia: Secondary | ICD-10-CM | POA: Diagnosis not present

## 2019-06-16 DIAGNOSIS — Z992 Dependence on renal dialysis: Secondary | ICD-10-CM | POA: Diagnosis not present

## 2019-06-16 DIAGNOSIS — N186 End stage renal disease: Secondary | ICD-10-CM | POA: Diagnosis not present

## 2019-06-16 DIAGNOSIS — D631 Anemia in chronic kidney disease: Secondary | ICD-10-CM | POA: Diagnosis not present

## 2019-06-18 DIAGNOSIS — D631 Anemia in chronic kidney disease: Secondary | ICD-10-CM | POA: Diagnosis not present

## 2019-06-18 DIAGNOSIS — N2581 Secondary hyperparathyroidism of renal origin: Secondary | ICD-10-CM | POA: Diagnosis not present

## 2019-06-18 DIAGNOSIS — E876 Hypokalemia: Secondary | ICD-10-CM | POA: Diagnosis not present

## 2019-06-18 DIAGNOSIS — N186 End stage renal disease: Secondary | ICD-10-CM | POA: Diagnosis not present

## 2019-06-18 DIAGNOSIS — Z992 Dependence on renal dialysis: Secondary | ICD-10-CM | POA: Diagnosis not present

## 2019-06-18 DIAGNOSIS — D509 Iron deficiency anemia, unspecified: Secondary | ICD-10-CM | POA: Diagnosis not present

## 2019-06-23 DIAGNOSIS — D631 Anemia in chronic kidney disease: Secondary | ICD-10-CM | POA: Diagnosis not present

## 2019-06-23 DIAGNOSIS — D509 Iron deficiency anemia, unspecified: Secondary | ICD-10-CM | POA: Diagnosis not present

## 2019-06-23 DIAGNOSIS — E876 Hypokalemia: Secondary | ICD-10-CM | POA: Diagnosis not present

## 2019-06-23 DIAGNOSIS — N2581 Secondary hyperparathyroidism of renal origin: Secondary | ICD-10-CM | POA: Diagnosis not present

## 2019-06-23 DIAGNOSIS — Z992 Dependence on renal dialysis: Secondary | ICD-10-CM | POA: Diagnosis not present

## 2019-06-23 DIAGNOSIS — N186 End stage renal disease: Secondary | ICD-10-CM | POA: Diagnosis not present

## 2019-06-25 DIAGNOSIS — Z992 Dependence on renal dialysis: Secondary | ICD-10-CM | POA: Diagnosis not present

## 2019-06-25 DIAGNOSIS — N186 End stage renal disease: Secondary | ICD-10-CM | POA: Diagnosis not present

## 2019-06-25 DIAGNOSIS — E876 Hypokalemia: Secondary | ICD-10-CM | POA: Diagnosis not present

## 2019-06-25 DIAGNOSIS — N2581 Secondary hyperparathyroidism of renal origin: Secondary | ICD-10-CM | POA: Diagnosis not present

## 2019-06-25 DIAGNOSIS — D631 Anemia in chronic kidney disease: Secondary | ICD-10-CM | POA: Diagnosis not present

## 2019-06-25 DIAGNOSIS — D509 Iron deficiency anemia, unspecified: Secondary | ICD-10-CM | POA: Diagnosis not present

## 2019-06-28 DIAGNOSIS — N186 End stage renal disease: Secondary | ICD-10-CM | POA: Diagnosis not present

## 2019-06-28 DIAGNOSIS — Z992 Dependence on renal dialysis: Secondary | ICD-10-CM | POA: Diagnosis not present

## 2019-06-28 DIAGNOSIS — D509 Iron deficiency anemia, unspecified: Secondary | ICD-10-CM | POA: Diagnosis not present

## 2019-06-28 DIAGNOSIS — D631 Anemia in chronic kidney disease: Secondary | ICD-10-CM | POA: Diagnosis not present

## 2019-06-28 DIAGNOSIS — N2581 Secondary hyperparathyroidism of renal origin: Secondary | ICD-10-CM | POA: Diagnosis not present

## 2019-06-28 DIAGNOSIS — E876 Hypokalemia: Secondary | ICD-10-CM | POA: Diagnosis not present

## 2019-06-30 DIAGNOSIS — Z992 Dependence on renal dialysis: Secondary | ICD-10-CM | POA: Diagnosis not present

## 2019-06-30 DIAGNOSIS — D509 Iron deficiency anemia, unspecified: Secondary | ICD-10-CM | POA: Diagnosis not present

## 2019-06-30 DIAGNOSIS — E876 Hypokalemia: Secondary | ICD-10-CM | POA: Diagnosis not present

## 2019-06-30 DIAGNOSIS — N2581 Secondary hyperparathyroidism of renal origin: Secondary | ICD-10-CM | POA: Diagnosis not present

## 2019-06-30 DIAGNOSIS — N186 End stage renal disease: Secondary | ICD-10-CM | POA: Diagnosis not present

## 2019-06-30 DIAGNOSIS — D631 Anemia in chronic kidney disease: Secondary | ICD-10-CM | POA: Diagnosis not present

## 2019-07-01 ENCOUNTER — Other Ambulatory Visit: Payer: Self-pay

## 2019-07-01 DIAGNOSIS — N185 Chronic kidney disease, stage 5: Secondary | ICD-10-CM

## 2019-07-02 DIAGNOSIS — N186 End stage renal disease: Secondary | ICD-10-CM | POA: Diagnosis not present

## 2019-07-02 DIAGNOSIS — N2581 Secondary hyperparathyroidism of renal origin: Secondary | ICD-10-CM | POA: Diagnosis not present

## 2019-07-02 DIAGNOSIS — E876 Hypokalemia: Secondary | ICD-10-CM | POA: Diagnosis not present

## 2019-07-02 DIAGNOSIS — Z992 Dependence on renal dialysis: Secondary | ICD-10-CM | POA: Diagnosis not present

## 2019-07-02 DIAGNOSIS — D509 Iron deficiency anemia, unspecified: Secondary | ICD-10-CM | POA: Diagnosis not present

## 2019-07-02 DIAGNOSIS — D631 Anemia in chronic kidney disease: Secondary | ICD-10-CM | POA: Diagnosis not present

## 2019-07-03 DIAGNOSIS — I10 Essential (primary) hypertension: Secondary | ICD-10-CM | POA: Diagnosis not present

## 2019-07-03 DIAGNOSIS — Z992 Dependence on renal dialysis: Secondary | ICD-10-CM | POA: Diagnosis not present

## 2019-07-03 DIAGNOSIS — N186 End stage renal disease: Secondary | ICD-10-CM | POA: Diagnosis not present

## 2019-07-05 DIAGNOSIS — E876 Hypokalemia: Secondary | ICD-10-CM | POA: Diagnosis not present

## 2019-07-05 DIAGNOSIS — N186 End stage renal disease: Secondary | ICD-10-CM | POA: Diagnosis not present

## 2019-07-05 DIAGNOSIS — N2581 Secondary hyperparathyroidism of renal origin: Secondary | ICD-10-CM | POA: Diagnosis not present

## 2019-07-05 DIAGNOSIS — D631 Anemia in chronic kidney disease: Secondary | ICD-10-CM | POA: Diagnosis not present

## 2019-07-05 DIAGNOSIS — D509 Iron deficiency anemia, unspecified: Secondary | ICD-10-CM | POA: Diagnosis not present

## 2019-07-05 DIAGNOSIS — Z992 Dependence on renal dialysis: Secondary | ICD-10-CM | POA: Diagnosis not present

## 2019-07-07 ENCOUNTER — Telehealth (HOSPITAL_COMMUNITY): Payer: Self-pay

## 2019-07-07 NOTE — Telephone Encounter (Signed)

## 2019-07-08 ENCOUNTER — Encounter: Payer: Self-pay | Admitting: *Deleted

## 2019-07-08 ENCOUNTER — Ambulatory Visit (INDEPENDENT_AMBULATORY_CARE_PROVIDER_SITE_OTHER): Payer: Medicare Other | Admitting: Physician Assistant

## 2019-07-08 ENCOUNTER — Other Ambulatory Visit: Payer: Self-pay | Admitting: *Deleted

## 2019-07-08 ENCOUNTER — Other Ambulatory Visit: Payer: Self-pay

## 2019-07-08 ENCOUNTER — Ambulatory Visit (HOSPITAL_COMMUNITY)
Admission: RE | Admit: 2019-07-08 | Discharge: 2019-07-08 | Disposition: A | Discharge status: Home / Self Care | Payer: Medicare Other | Source: Ambulatory Visit | Attending: Family | Admitting: Family

## 2019-07-08 VITALS — BP 164/72 | HR 59 | Resp 16 | Ht 68.0 in | Wt 143.0 lb

## 2019-07-08 DIAGNOSIS — N185 Chronic kidney disease, stage 5: Secondary | ICD-10-CM | POA: Insufficient documentation

## 2019-07-08 DIAGNOSIS — I25119 Atherosclerotic heart disease of native coronary artery with unspecified angina pectoris: Secondary | ICD-10-CM

## 2019-07-08 DIAGNOSIS — T82898A Other specified complication of vascular prosthetic devices, implants and grafts, initial encounter: Secondary | ICD-10-CM

## 2019-07-08 NOTE — H&P (View-Only) (Signed)
History of Present Illness:  Patient is a 58 y.o. year old male who presents for evaluation of his left forearm fistula.  He has developed thinning damaged skin over and aneurysm from repeated sticks during HD.  He denise loss of motor and sensation.  The fistula was created 05/28/2017 by Dr. Bridgett Larsson.    Past medical history includes ESRD HD TTS, HTN, CHF and  thoracic aneurysm followed by cardiology.    Past Medical History:  Diagnosis Date  . Acute kidney injury superimposed on CKD ([place]) 05/24/2017  . Acute on chronic diastolic CHF (congestive heart failure) ([place]) 04/13/2018  . AKI (acute kidney injury) ([place]) 04/10/2015  . Asthma   . Asthma, chronic, unspecified asthma severity, with acute exacerbation 10/23/2017  . Atypical chest pain 02/16/2018  . CAP (community acquired pneumonia) 10/23/2017  . Cerebral infarction due to thrombosis of cerebral artery ([place])   . Cerebrovascular accident (CVA) ([place])   . Chronic kidney disease   . CKD (chronic kidney disease), stage V ([place]) 05/24/2017  . ESRD (end stage renal disease) on dialysis ([place]) 02/16/2018  . Essential hypertension 04/09/2015  . GERD (gastroesophageal reflux disease) 06/04/2019  . GI bleeding 05/24/2017  . History of completed stroke   . Hyperlipidemia   . Hypertension   . Hypertensive urgency 05/24/2017  . Kidney failure   . Respiratory failure, acute ([place]) 10/23/2017  . Restless leg syndrome   . Stroke ([place])   . Symptomatic anemia 05/24/2017    Past Surgical History:  Procedure Laterality Date  . AV FISTULA PLACEMENT Left 05/28/2017   Procedure: LEFT ARM ARTERIOVENOUS (AV) FISTULA CREATION;  Surgeon: Conrad [place], MD;  Location: [facility];  Service: Vascular;  Laterality: Left;  . IR FLUORO GUIDE CV LINE RIGHT  05/25/2017  . IR US GUIDE VASC ACCESS RIGHT  05/25/2017  . RINOPLASTY       Social History Social History   Tobacco Use  . Smoking status: Never Smoker  . Smokeless tobacco: Never Used  Substance Use Topics  . Alcohol  use: No    Alcohol/week: 0.0 standard drinks    Comment: has a drink once in a while- 07/2018  . Drug use: No    Family History Family History  Problem Relation Age of Onset  . Cancer Mother     Allergies  No Known Allergies   Current Outpatient Medications  Medication Sig Dispense Refill  . acetaminophen (TYLENOL) 500 MG tablet Take 1,000 mg by mouth every 6 (six) hours as needed for moderate pain.    [name] albuterol (PROVENTIL HFA;VENTOLIN HFA) 108 (90 BASE) MCG/ACT inhaler Inhale 1-2 puffs into the lungs every 6 (six) hours as needed for wheezing or shortness of breath. 1 Inhaler 0  . amLODipine (NORVASC) 10 MG tablet Take 1 tablet (10 mg total) by mouth daily. 30 tablet 0  . atorvastatin (LIPITOR) 20 MG tablet Take 1 tablet (20 mg total) by mouth daily. 90 tablet 1  . budesonide-formoterol (SYMBICORT) 80-4.5 MCG/ACT inhaler Inhale 2 puffs into the lungs 2 (two) times daily. 1 Inhaler 1  . carvedilol (COREG) 12.5 MG tablet Take 1 tablet (12.5 mg total) by mouth 2 (two) times daily with a meal. 60 tablet 0  . cinacalcet (SENSIPAR) 30 MG tablet Take 30 mg by mouth daily with supper. Do not take less than 12 hours prior to dialysis  10  . DUREZOL 0.05 % EMUL Place 1 drop into the right eye See admin instructions. Start 3 days before  procedure    . gabapentin (NEURONTIN) 300 MG capsule Take 300 mg by mouth at bedtime.    [name] gatifloxacin (ZYMAXID) 0.5 % SOLN Place 1 drop into the right eye See admin instructions. Start 3 days before procedure    . hydrALAZINE (APRESOLINE) 100 MG tablet Take 1 tablet (100 mg total) by mouth every 8 (eight) hours. 90 tablet 0  . multivitamin (RENA-VIT) TABS tablet Take 1 tablet by mouth daily.  3  . pantoprazole (PROTONIX) 40 MG tablet Take 40 mg by mouth daily as needed.   3  . sevelamer (RENAGEL) 800 MG tablet Take 800-1,600 mg by mouth See admin instructions. 1,600mg  by mouth three times daily and 800mg  twice daily with snacks  6  . aspirin EC 81 MG EC  tablet Take 1 tablet (81 mg total) by mouth daily. (Patient not taking: Reported on 07/08/2019) 30 tablet 0  . BESIVANCE 0.6 % SUSP Place 1 drop into the right eye 3 (three) times daily.    . cinacalcet (SENSIPAR) 60 MG tablet Take 60 mg by mouth at bedtime.     No current facility-administered medications for this visit.     ROS:   General:  No weight loss, Fever, chills  HEENT: No recent headaches, no nasal bleeding, no visual changes, no sore throat  Neurologic: No dizziness, blackouts, seizures. No recent symptoms of stroke or mini- stroke. No recent episodes of slurred speech, or temporary blindness.  Cardiac: No recent episodes of chest pain/pressure, no shortness of breath at rest.  No shortness of breath with exertion.  Denies history of atrial fibrillation or irregular heartbeat  Vascular: No history of rest pain in feet.  No history of claudication.  No history of non-healing ulcer, No history of DVT   Pulmonary: No home oxygen, no productive cough, no hemoptysis,  No asthma or wheezing  Musculoskeletal:  [x ] Arthritis, [ ]  Low back pain,  [ x] Joint pain  Hematologic:No history of hypercoagulable state.  No history of easy bleeding.  No history of anemia  Gastrointestinal: No hematochezia or melena,  No gastroesophageal reflux, no trouble swallowing  Urinary: [ ]  chronic Kidney disease, [x ] on HD - [ ]  MWF or [x ] TTHS, [ ]  Burning with urination, [ ]  Frequent urination, [ ]  Difficulty urinating;   Skin: No rashes  Psychological: No history of anxiety,  No history of depression   Physical Examination  Vitals:   07/08/19 1326  BP: (!) 164/72  Pulse: (!) 59  Resp: 16  SpO2: 97%  Weight: 143 lb (64.9 kg)  Height: 5\' 8"  (1.727 m)    Body mass index is 21.74 kg/m.  General:  Alert and oriented, no acute distress HEENT: Normal Neck: No bruit or JVD Pulmonary: Clear to auscultation bilaterally Cardiac: Regular Rate and Rhythm without murmur Gastrointestinal:  Soft, non-tender, non-distended, no mass, no scars Skin: No rash Extremity Pulses:  2+ radial, brachial pulses bilaterally, palpable thrill in fistula Thin shiny attached skin over distal aneurysm of left forearm fistula.   Musculoskeletal: No deformity or edema  Neurologic: Upper and lower extremity motor 5/5 and symmetric     ASSESSMENT:  ESRD with aneurysm and damaged skin left forearm fistula.  PLAN: I have scheduled him for plication of left forearm aneurysm.  He is not on anticoagulation.  He has HD on TTS.    Roxy Horseman PA-C [facility] Office: [phone]

## 2019-07-08 NOTE — Progress Notes (Signed)
History of Present Illness:  Patient is a 58 y.o. year old male who presents for evaluation of his left forearm fistula.  He has developed thinning damaged skin over and aneurysm from repeated sticks during HD.  He denise loss of motor and sensation.  The fistula was created 05/28/2017 by Dr. Bridgett Larsson.    Past medical history includes ESRD HD TTS, HTN, CHF and  thoracic aneurysm followed by cardiology.    Past Medical History:  Diagnosis Date  . Acute kidney injury superimposed on CKD ([place]) 05/24/2017  . Acute on chronic diastolic CHF (congestive heart failure) ([place]) 04/13/2018  . AKI (acute kidney injury) ([place]) 04/10/2015  . Asthma   . Asthma, chronic, unspecified asthma severity, with acute exacerbation 10/23/2017  . Atypical chest pain 02/16/2018  . CAP (community acquired pneumonia) 10/23/2017  . Cerebral infarction due to thrombosis of cerebral artery ([place])   . Cerebrovascular accident (CVA) ([place])   . Chronic kidney disease   . CKD (chronic kidney disease), stage V ([place]) 05/24/2017  . ESRD (end stage renal disease) on dialysis ([place]) 02/16/2018  . Essential hypertension 04/09/2015  . GERD (gastroesophageal reflux disease) 06/04/2019  . GI bleeding 05/24/2017  . History of completed stroke   . Hyperlipidemia   . Hypertension   . Hypertensive urgency 05/24/2017  . Kidney failure   . Respiratory failure, acute ([place]) 10/23/2017  . Restless leg syndrome   . Stroke ([place])   . Symptomatic anemia 05/24/2017    Past Surgical History:  Procedure Laterality Date  . AV FISTULA PLACEMENT Left 05/28/2017   Procedure: LEFT ARM ARTERIOVENOUS (AV) FISTULA CREATION;  Surgeon: Conrad Green Bank, MD;  Location: [facility];  Service: Vascular;  Laterality: Left;  . IR FLUORO GUIDE CV LINE RIGHT  05/25/2017  . IR US GUIDE VASC ACCESS RIGHT  05/25/2017  . RINOPLASTY       Social History Social History   Tobacco Use  . Smoking status: Never Smoker  . Smokeless tobacco: Never Used  Substance Use Topics  . Alcohol  use: No    Alcohol/week: 0.0 standard drinks    Comment: has a drink once in a while- 07/2018  . Drug use: No    Family History Family History  Problem Relation Age of Onset  . Cancer Mother     Allergies  No Known Allergies   Current Outpatient Medications  Medication Sig Dispense Refill  . acetaminophen (TYLENOL) 500 MG tablet Take 1,000 mg by mouth every 6 (six) hours as needed for moderate pain.    [name] albuterol (PROVENTIL HFA;VENTOLIN HFA) 108 (90 BASE) MCG/ACT inhaler Inhale 1-2 puffs into the lungs every 6 (six) hours as needed for wheezing or shortness of breath. 1 Inhaler 0  . amLODipine (NORVASC) 10 MG tablet Take 1 tablet (10 mg total) by mouth daily. 30 tablet 0  . atorvastatin (LIPITOR) 20 MG tablet Take 1 tablet (20 mg total) by mouth daily. 90 tablet 1  . budesonide-formoterol (SYMBICORT) 80-4.5 MCG/ACT inhaler Inhale 2 puffs into the lungs 2 (two) times daily. 1 Inhaler 1  . carvedilol (COREG) 12.5 MG tablet Take 1 tablet (12.5 mg total) by mouth 2 (two) times daily with a meal. 60 tablet 0  . cinacalcet (SENSIPAR) 30 MG tablet Take 30 mg by mouth daily with supper. Do not take less than 12 hours prior to dialysis  10  . DUREZOL 0.05 % EMUL Place 1 drop into the right eye See admin instructions. Start 3 days before  procedure    . gabapentin (NEURONTIN) 300 MG capsule Take 300 mg by mouth at bedtime.    [name] gatifloxacin (ZYMAXID) 0.5 % SOLN Place 1 drop into the right eye See admin instructions. Start 3 days before procedure    . hydrALAZINE (APRESOLINE) 100 MG tablet Take 1 tablet (100 mg total) by mouth every 8 (eight) hours. 90 tablet 0  . multivitamin (RENA-VIT) TABS tablet Take 1 tablet by mouth daily.  3  . pantoprazole (PROTONIX) 40 MG tablet Take 40 mg by mouth daily as needed.   3  . sevelamer (RENAGEL) 800 MG tablet Take 800-1,600 mg by mouth See admin instructions. 1,600mg  by mouth three times daily and 800mg  twice daily with snacks  6  . aspirin EC 81 MG EC  tablet Take 1 tablet (81 mg total) by mouth daily. (Patient not taking: Reported on 07/08/2019) 30 tablet 0  . BESIVANCE 0.6 % SUSP Place 1 drop into the right eye 3 (three) times daily.    . cinacalcet (SENSIPAR) 60 MG tablet Take 60 mg by mouth at bedtime.     No current facility-administered medications for this visit.     ROS:   General:  No weight loss, Fever, chills  HEENT: No recent headaches, no nasal bleeding, no visual changes, no sore throat  Neurologic: No dizziness, blackouts, seizures. No recent symptoms of stroke or mini- stroke. No recent episodes of slurred speech, or temporary blindness.  Cardiac: No recent episodes of chest pain/pressure, no shortness of breath at rest.  No shortness of breath with exertion.  Denies history of atrial fibrillation or irregular heartbeat  Vascular: No history of rest pain in feet.  No history of claudication.  No history of non-healing ulcer, No history of DVT   Pulmonary: No home oxygen, no productive cough, no hemoptysis,  No asthma or wheezing  Musculoskeletal:  [x ] Arthritis, [ ]  Low back pain,  [ x] Joint pain  Hematologic:No history of hypercoagulable state.  No history of easy bleeding.  No history of anemia  Gastrointestinal: No hematochezia or melena,  No gastroesophageal reflux, no trouble swallowing  Urinary: [ ]  chronic Kidney disease, [x ] on HD - [ ]  MWF or [x ] TTHS, [ ]  Burning with urination, [ ]  Frequent urination, [ ]  Difficulty urinating;   Skin: No rashes  Psychological: No history of anxiety,  No history of depression   Physical Examination  Vitals:   07/08/19 1326  BP: (!) 164/72  Pulse: (!) 59  Resp: 16  SpO2: 97%  Weight: 143 lb (64.9 kg)  Height: 5\' 8"  (1.727 m)    Body mass index is 21.74 kg/m.  General:  Alert and oriented, no acute distress HEENT: Normal Neck: No bruit or JVD Pulmonary: Clear to auscultation bilaterally Cardiac: Regular Rate and Rhythm without murmur Gastrointestinal:  Soft, non-tender, non-distended, no mass, no scars Skin: No rash Extremity Pulses:  2+ radial, brachial pulses bilaterally, palpable thrill in fistula Thin shiny attached skin over distal aneurysm of left forearm fistula.   Musculoskeletal: No deformity or edema  Neurologic: Upper and lower extremity motor 5/5 and symmetric     ASSESSMENT:  ESRD with aneurysm and damaged skin left forearm fistula.  PLAN: I have scheduled him for plication of left forearm aneurysm.  He is not on anticoagulation.  He has HD on TTS.    Roxy Horseman PA-C [facility] Office: [phone]

## 2019-07-09 DIAGNOSIS — E876 Hypokalemia: Secondary | ICD-10-CM | POA: Diagnosis not present

## 2019-07-09 DIAGNOSIS — N186 End stage renal disease: Secondary | ICD-10-CM | POA: Diagnosis not present

## 2019-07-09 DIAGNOSIS — Z992 Dependence on renal dialysis: Secondary | ICD-10-CM | POA: Diagnosis not present

## 2019-07-09 DIAGNOSIS — D631 Anemia in chronic kidney disease: Secondary | ICD-10-CM | POA: Diagnosis not present

## 2019-07-09 DIAGNOSIS — N2581 Secondary hyperparathyroidism of renal origin: Secondary | ICD-10-CM | POA: Diagnosis not present

## 2019-07-09 DIAGNOSIS — D509 Iron deficiency anemia, unspecified: Secondary | ICD-10-CM | POA: Diagnosis not present

## 2019-07-12 DIAGNOSIS — E876 Hypokalemia: Secondary | ICD-10-CM | POA: Diagnosis not present

## 2019-07-12 DIAGNOSIS — Z992 Dependence on renal dialysis: Secondary | ICD-10-CM | POA: Diagnosis not present

## 2019-07-12 DIAGNOSIS — D631 Anemia in chronic kidney disease: Secondary | ICD-10-CM | POA: Diagnosis not present

## 2019-07-12 DIAGNOSIS — N2581 Secondary hyperparathyroidism of renal origin: Secondary | ICD-10-CM | POA: Diagnosis not present

## 2019-07-12 DIAGNOSIS — D509 Iron deficiency anemia, unspecified: Secondary | ICD-10-CM | POA: Diagnosis not present

## 2019-07-12 DIAGNOSIS — N186 End stage renal disease: Secondary | ICD-10-CM | POA: Diagnosis not present

## 2019-07-14 DIAGNOSIS — N2581 Secondary hyperparathyroidism of renal origin: Secondary | ICD-10-CM | POA: Diagnosis not present

## 2019-07-14 DIAGNOSIS — D509 Iron deficiency anemia, unspecified: Secondary | ICD-10-CM | POA: Diagnosis not present

## 2019-07-14 DIAGNOSIS — N186 End stage renal disease: Secondary | ICD-10-CM | POA: Diagnosis not present

## 2019-07-14 DIAGNOSIS — Z992 Dependence on renal dialysis: Secondary | ICD-10-CM | POA: Diagnosis not present

## 2019-07-14 DIAGNOSIS — D631 Anemia in chronic kidney disease: Secondary | ICD-10-CM | POA: Diagnosis not present

## 2019-07-14 DIAGNOSIS — E876 Hypokalemia: Secondary | ICD-10-CM | POA: Diagnosis not present

## 2019-07-15 ENCOUNTER — Encounter (HOSPITAL_COMMUNITY): Payer: Self-pay

## 2019-07-15 ENCOUNTER — Ambulatory Visit (INDEPENDENT_AMBULATORY_CARE_PROVIDER_SITE_OTHER): Payer: Medicare Other | Admitting: Cardiology

## 2019-07-15 ENCOUNTER — Other Ambulatory Visit (HOSPITAL_COMMUNITY)
Admission: RE | Admit: 2019-07-15 | Discharge: 2019-07-15 | Disposition: A | Discharge status: Home / Self Care | Payer: Medicare Other | Source: Ambulatory Visit | Attending: Vascular Surgery | Admitting: Vascular Surgery

## 2019-07-15 ENCOUNTER — Encounter: Payer: Self-pay | Admitting: Cardiology

## 2019-07-15 ENCOUNTER — Other Ambulatory Visit: Payer: Self-pay

## 2019-07-15 VITALS — BP 110/60 | HR 62 | Ht 68.0 in | Wt 141.0 lb

## 2019-07-15 DIAGNOSIS — I10 Essential (primary) hypertension: Secondary | ICD-10-CM | POA: Diagnosis not present

## 2019-07-15 DIAGNOSIS — E782 Mixed hyperlipidemia: Secondary | ICD-10-CM

## 2019-07-15 DIAGNOSIS — I25118 Atherosclerotic heart disease of native coronary artery with other forms of angina pectoris: Secondary | ICD-10-CM | POA: Diagnosis not present

## 2019-07-15 DIAGNOSIS — Z01812 Encounter for preprocedural laboratory examination: Secondary | ICD-10-CM | POA: Diagnosis not present

## 2019-07-15 DIAGNOSIS — Z20828 Contact with and (suspected) exposure to other viral communicable diseases: Secondary | ICD-10-CM | POA: Insufficient documentation

## 2019-07-15 LAB — SARS CORONAVIRUS 2 (TAT 6-24 HRS): SARS Coronavirus 2: NEGATIVE

## 2019-07-15 MED ORDER — ISOSORBIDE MONONITRATE ER 30 MG PO TB24: 30.0000 mg | ORAL_TABLET | Freq: Every day | ORAL | 1 refills | Status: DC

## 2019-07-15 NOTE — Progress Notes (Signed)
Anesthesia Chart Review: Same day workup  Follows with cardiology for HFpEF, CAD (mild, nonobstructive by coronary CT 2019) , monitoring of thoracic aortic aneurysm (4.5 cm by CT 2020). Last seen by Dr. Harriet Masson 07/15/19. Per note he was started on Imdur for treatment of stable angina. It was discussed that he would be having fistula revision surgery on 06/17/19.  Will need DOS labs and eval.    EKG 06/10/19: NSR. Rate 73. Possible LAE. LVH.  TTE 05/17/2019  SUMMARY LVEF 65-70%, severe LVH (consider work-up for infiltrative cardiomyopathy such as amyloidosis), normal wall motion, grade 1 DD, elevated LV filling pressure, normal RV function, severe LAE, aortic valve sclerosis with moderate AI, trivial MR, mild TR, RVSP 54 mmHg, dilated IVC that collapses, trivial pericardial effusion  Coronary CT 06/24/18  IMPRESSION: 1. Coronary calcium score of 39. This was 36 percentile for age and sex matched control. 2. Normal coronary origin with right dominance. 3. Mild non-obstructive CAD. 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 4. Aneurysmal dilatation of the ascending aorta with maximum diameter 43 mm, no calcifications and no dissection. 5. Dilated pulmonary artery measuring 35 mm suggestive of pulmonary Hypertension.   Wynonia Musty [facility] Short Stay Center/Anesthesiology Phone [phone] 07/15/2019 12:49 PM

## 2019-07-15 NOTE — Anesthesia Preprocedure Evaluation (Addendum)
Anesthesia Evaluation  Patient identified by MRN, date of birth, ID band Patient awake    Reviewed: Allergy & Precautions, NPO status , Patient's Chart, lab work & pertinent test results  Airway Mallampati: II  TM Distance: >3 FB     Dental  (+) Dental Advisory Given, Caps,    Pulmonary pneumonia,    breath sounds clear to auscultation       Cardiovascular hypertension, Pt. on medications and Pt. on home beta blockers +CHF   Rhythm:Regular Rate:Normal     Neuro/Psych    GI/Hepatic Neg liver ROS, GERD  ,  Endo/Other  negative endocrine ROS  Renal/GU DialysisRenal diseaseT, TH, SAT Last HD Sat 11/14     Musculoskeletal   Abdominal   Peds  Hematology  (+) anemia ,   Anesthesia Other Findings   Reproductive/Obstetrics                           Anesthesia Physical Anesthesia Plan  ASA: III  Anesthesia Plan: General   Post-op Pain Management:    Induction: Intravenous  PONV Risk Score and Plan: 2 and Ondansetron and Midazolam  Airway Management Planned: LMA  Additional Equipment:   Intra-op Plan:   Post-operative Plan:   Informed Consent: I have reviewed the patients History and Physical, chart, labs and discussed the procedure including the risks, benefits and alternatives for the proposed anesthesia with the patient or authorized representative who has indicated his/her understanding and acceptance.     Dental advisory given  Plan Discussed with: CRNA and Anesthesiologist  Anesthesia Plan Comments: (Follows with cardiology for HFpEF, CAD (mild, nonobstructive by coronary CT 2019) , monitoring of thoracic aortic aneurysm (4.5 cm by CT 2020). Last seen by Dr. Harriet Masson 07/15/19. Per note he was started on Imdur for treatment of stable angina. It was discussed that he would be having fistula revision surgery on 06/17/19.  Will need DOS labs and eval.    EKG 06/10/19: NSR. Rate 73.  Possible LAE. LVH.  TTE 05/17/2019  SUMMARY LVEF 65-70%, severe LVH (consider work-up for infiltrative cardiomyopathy such as amyloidosis), normal wall motion, grade 1 DD, elevated LV filling pressure, normal RV function, severe LAE, aortic valve sclerosis with moderate AI, trivial MR, mild TR, RVSP 54 mmHg, dilated IVC that collapses, trivial pericardial effusion  Coronary CT 06/24/18  IMPRESSION: 1. Coronary calcium score of 39. This was 37 percentile for age and sex matched control. 2. Normal coronary origin with right dominance. 3. Mild non-obstructive CAD. 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 4. Aneurysmal dilatation of the ascending aorta with maximum diameter 43 mm, no calcifications and no dissection. 5. Dilated pulmonary artery measuring 35 mm suggestive of pulmonary hypertension. )       Anesthesia Quick Evaluation

## 2019-07-15 NOTE — Patient Instructions (Signed)
Medication Instructions:  Your physician has recommended you make the following change in your medication:   START: IMDUR (Isosorbide) 30 mg Take 1 tab daily  *If you need a refill on your cardiac medications before your next appointment, please call your pharmacy*  Lab Work: NOne If you have labs (blood work) drawn today and your tests are completely normal, you will receive your results only by: [name] MyChart Message (if you have MyChart) OR . A paper copy in the mail If you have any lab test that is abnormal or we need to change your treatment, we will call you to review the results.  Testing/Procedures: NOne  Follow-Up: At [facility], you and your health needs are our priority.  As part of our continuing mission to provide you with exceptional heart care, we have created designated Provider Care Teams.  These Care Teams include your primary Cardiologist (physician) and Advanced Practice Providers (APPs -  Physician Assistants and Nurse Practitioners) who all work together to provide you with the care you need, when you need it.  Your next appointment:   3 months  The format for your next appointment:   In Person  Provider:   [name], DO  Other Instructions Isosorbide Mononitrate extended-release tablets What is this medicine? ISOSORBIDE MONONITRATE (eye soe SOR bide mon oh NYE trate) is a vasodilator. It relaxes blood vessels, increasing the blood and oxygen supply to your heart. This medicine is used to prevent chest pain caused by angina. It will not help to stop an episode of chest pain. This medicine may be used for other purposes; ask your health care provider or pharmacist if you have questions. COMMON BRAND NAME(S): Imdur, Isotrate ER What should I tell my health care provider before I take this medicine? They need to know if you have any of these conditions:  previous heart attack or heart failure  an unusual or allergic reaction to isosorbide mononitrate,  nitrates, other medicines, foods, dyes, or preservatives  pregnant or trying to get pregnant  breast-feeding How should I use this medicine? Take this medicine by mouth with a glass of water. Follow the directions on the prescription label. Do not crush or chew. Take your medicine at regular intervals. Do not take your medicine more often than directed. Do not stop taking this medicine except on the advice of your doctor or health care professional. Talk to your pediatrician regarding the use of this medicine in children. Special care may be needed. Overdosage: If you think you have taken too much of this medicine contact a poison control center or emergency room at once. NOTE: This medicine is only for you. Do not share this medicine with others. What if I miss a dose? If you miss a dose, take it as soon as you can. If it is almost time for your next dose, take only that dose. Do not take double or extra doses. What may interact with this medicine? Do not take this medicine with any of the following medications:  medicines used to treat erectile dysfunction (ED) like avanafil, sildenafil, tadalafil, and vardenafil  riociguat This medicine may also interact with the following medications:  medicines for high blood pressure  other medicines for angina or heart failure This list may not describe all possible interactions. Give your health care provider a list of all the medicines, herbs, non-prescription drugs, or dietary supplements you use. Also tell them if you smoke, drink alcohol, or use illegal drugs. Some items may interact with  your medicine. What should I watch for while using this medicine? Check your heart rate and blood pressure regularly while you are taking this medicine. Ask your doctor or health care professional what your heart rate and blood pressure should be and when you should contact him or her. Tell your doctor or health care professional if you feel your medicine is no  longer working. You may get dizzy. Do not drive, use machinery, or do anything that needs mental alertness until you know how this medicine affects you. To reduce the risk of dizzy or fainting spells, do not sit or stand up quickly, especially if you are an older patient. Alcohol can make you more dizzy, and increase flushing and rapid heartbeats. Avoid alcoholic drinks. Do not treat yourself for coughs, colds, or pain while you are taking this medicine without asking your doctor or health care professional for advice. Some ingredients may increase your blood pressure. What side effects may I notice from receiving this medicine? Side effects that you should report to your doctor or health care professional as soon as possible:  bluish discoloration of lips, fingernails, or palms of hands  irregular heartbeat, palpitations  low blood pressure  nausea, vomiting  persistent headache  unusually weak or tired Side effects that usually do not require medical attention (report to your doctor or health care professional if they continue or are bothersome):  flushing of the face or neck  rash This list may not describe all possible side effects. Call your doctor for medical advice about side effects. You may report side effects to FDA at 1-800-FDA-1088. Where should I keep my medicine? Keep out of the reach of children. Store between 15 and 30 degrees C (59 and 86 degrees F). Keep container tightly closed. Throw away any unused medicine after the expiration date. NOTE: This sheet is a summary. It may not cover all possible information. If you have questions about this medicine, talk to your doctor, pharmacist, or health care provider.  2020 Elsevier/Gold Standard (2013-06-17 14:48:19)

## 2019-07-15 NOTE — Progress Notes (Signed)
Cardiology Office Note:    Date:  07/15/2019   ID:  Thomas Robinson, DOB 1961-02-09, MRN [id number]  PCP:  Charlott Rakes, MD  Cardiologist:  [name], DO  Electrophysiologist:  None   Referring MD: Charlott Rakes, MD   Chief Complaint  Patient presents with   Follow-up    History of Present Illness:    Thomas Robinson is a 58 y.o. male with a hx of ESRD on hemodialysis(Tuesdays, Thursday, Saturday) hypertension, asthma, CVA, coronary artery disease, diastolic heart failure with recent ejection fraction reported as greater than 65%, anemia chronic disease.  I did see the patient on June 10, 2019 at that time he was presenting to establish care after posthospitalization.  In his initial visit on records reviewed the patient did have a CT his hospitalization which revealed a 4.5 cm thoracic aortic aneurysm.  Previous CT in October 2019 did show that thoracic aneurysm which was by 4.3 cm.  During his visit he did not have any planes.  However he had elevated blood pressure but there were no medication has been made at the patient had not taking any of the medication he had been started on.  I did advise the patient that he needed to start his medication and follow-up.  In addition he had coronary artery disease by CT therefore started him on aspirin 81 mg daily as well as 20 mg of atorvastatin.  Today the patient tells me that he has been experiencing substernal chest discomfort.  Notes that this last for several minutes prior to solution he describes it as a chest pressure-like feeling that is always there.  Has any shortness of breath, palpitations or any nausea, vomiting.  He is planning to undergo his vascular surgery on Monday for dialysis fistula repair.  Past Medical History:  Diagnosis Date   Acute kidney injury superimposed on CKD ([place]) 05/24/2017   Acute on chronic diastolic CHF (congestive heart failure) ([place]) 04/13/2018   AKI (acute kidney injury) ([place]) 04/10/2015    Asthma    Asthma, chronic, unspecified asthma severity, with acute exacerbation 10/23/2017   Atypical chest pain 02/16/2018   CAP (community acquired pneumonia) 10/23/2017   Cerebral infarction due to thrombosis of cerebral artery (HCC)    Cerebrovascular accident (CVA) ([place])    Chronic kidney disease    CKD (chronic kidney disease), stage V ([place]) 05/24/2017   ESRD (end stage renal disease) on dialysis ([place]) 02/16/2018   Essential hypertension 04/09/2015   GERD (gastroesophageal reflux disease) 06/04/2019   GI bleeding 05/24/2017   History of completed stroke    Hyperlipidemia    Hypertension    Hypertensive urgency 05/24/2017   Kidney failure    Respiratory failure, acute ([place]) 10/23/2017   Restless leg syndrome    Stroke ([place])    Symptomatic anemia 05/24/2017    Past Surgical History:  Procedure Laterality Date   AV FISTULA PLACEMENT Left 05/28/2017   Procedure: LEFT ARM ARTERIOVENOUS (AV) FISTULA CREATION;  Surgeon: Conrad Willow, MD;  Location: [facility] OR;  Service: Vascular;  Laterality: Left;   IR FLUORO GUIDE CV LINE RIGHT  05/25/2017   IR US GUIDE VASC ACCESS RIGHT  05/25/2017   RINOPLASTY      Current Medications: Current Meds  Medication Sig   acetaminophen (TYLENOL) 500 MG tablet Take 1,000 mg by mouth every 6 (six) hours as needed for moderate pain.   albuterol (PROVENTIL HFA;VENTOLIN HFA) 108 (90 BASE) MCG/ACT inhaler Inhale 1-2 puffs into the lungs every 6 (  six) hours as needed for wheezing or shortness of breath.   amLODipine (NORVASC) 10 MG tablet Take 1 tablet (10 mg total) by mouth daily.   atorvastatin (LIPITOR) 20 MG tablet Take 1 tablet (20 mg total) by mouth daily.   BESIVANCE 0.6 % SUSP Place 1 drop into the right eye 3 (three) times daily.   budesonide-formoterol (SYMBICORT) 80-4.5 MCG/ACT inhaler Inhale 2 puffs into the lungs 2 (two) times daily.   carvedilol (COREG) 12.5 MG tablet Take 1 tablet (12.5 mg total) by mouth 2 (two) times  daily with a meal.   cinacalcet (SENSIPAR) 30 MG tablet Take 30 mg by mouth daily with supper. Do not take less than 12 hours prior to dialysis   cinacalcet (SENSIPAR) 60 MG tablet Take 60 mg by mouth at bedtime.   DUREZOL 0.05 % EMUL Place 1 drop into the right eye See admin instructions. Start 3 days before procedure   gabapentin (NEURONTIN) 300 MG capsule Take 300 mg by mouth at bedtime.   gatifloxacin (ZYMAXID) 0.5 % SOLN Place 1 drop into the right eye See admin instructions. Start 3 days before procedure   hydrALAZINE (APRESOLINE) 100 MG tablet Take 1 tablet (100 mg total) by mouth every 8 (eight) hours.   multivitamin (RENA-VIT) TABS tablet Take 1 tablet by mouth daily.   pantoprazole (PROTONIX) 40 MG tablet Take 40 mg by mouth daily as needed.    sevelamer (RENAGEL) 800 MG tablet Take 800-1,600 mg by mouth See admin instructions. 1,600mg  by mouth three times daily and 800mg  twice daily with snacks     Allergies:   Patient has no known allergies.   Social History   Socioeconomic History   Marital status: Single    Spouse name: Not on file   Number of children: Not on file   Years of education: Not on file   Highest education level: Not on file  Occupational History   Not on file  Social Needs   Financial resource strain: Not on file   Food insecurity    Worry: Not on file    Inability: Not on file   Transportation needs    Medical: Not on file    Non-medical: Not on file  Tobacco Use   Smoking status: Never Smoker   Smokeless tobacco: Never Used  Substance and Sexual Activity   Alcohol use: No    Alcohol/week: 0.0 standard drinks    Comment: has a drink once in a while- 07/2018   Drug use: No   Sexual activity: Not on file  Lifestyle   Physical activity    Days per week: Not on file    Minutes per session: Not on file   Stress: Not on file  Relationships   Social connections    Talks on phone: Not on file    Gets together: Not on file      Attends religious service: Not on file    Active member of club or organization: Not on file    Attends meetings of clubs or organizations: Not on file    Relationship status: Not on file  Other Topics Concern   Not on file  Social History Narrative   Lives with a roommate in a one story home.  Has 1 child.  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  Education: high school.     Family History: The patient's family history includes Cancer in his mother.  ROS:   Review of Systems  Constitution: Negative for  decreased appetite, fever and weight gain.  HENT: Negative for congestion, ear discharge, hoarse voice and sore throat.   Eyes: Negative for discharge, redness, vision loss in right eye and visual halos.  Cardiovascular: Reports chest pain.  Negative for dyspnea on exertion, leg swelling, orthopnea and palpitations.  Respiratory: Negative for cough, hemoptysis, shortness of breath and snoring.   Endocrine: Negative for heat intolerance and polyphagia.  Hematologic/Lymphatic: Negative for bleeding problem. Does not bruise/bleed easily.  Skin: Negative for flushing, nail changes, rash and suspicious lesions.  Musculoskeletal: Negative for arthritis, joint pain, muscle cramps, myalgias, neck pain and stiffness.  Gastrointestinal: Negative for abdominal pain, bowel incontinence, diarrhea and excessive appetite.  Genitourinary: Negative for decreased libido, genital sores and incomplete emptying.  Neurological: Negative for brief paralysis, focal weakness, headaches and loss of balance.  Psychiatric/Behavioral: Negative for altered mental status, depression and suicidal ideas.  Allergic/Immunologic: Negative for HIV exposure and persistent infections.    EKGs/Labs/Other Studies Reviewed:    The following studies were reviewed today:   EKG: None  Coronary CT June 18, 2019 IMPRESSION: 1. Coronary calcium score of 39. This was 18 percentile for age and sex matched control. 2.  Normal coronary origin with right dominance. 3. Mild non-obstructive CAD. 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 4. Aneurysmal dilatation of the ascending aorta with maximum diameter 43 mm, no calcifications and no dissection. 5. Dilated pulmonary artery measuring 35 mm suggestive of pulmonary hypertension.   TTE 05/17/2019 SUMMARY  LVEF 65-70%, severe LVH (consider work-up for infiltrative cardiomyopathy such as amyloidosis), normal wall motion, grade 1 DD, elevated LV filling pressure, normal RV function, severe LAE, aortic valve sclerosis with moderate AI, trivial MR, mild TR, RVSP 54 mmHg, dilated IVC that collapses, trivial pericardial effusion   Recent Labs: 05/16/2019: B Natriuretic Peptide >4,500.0 06/04/2019: BUN 27; Creatinine, Ser 5.78; Hemoglobin 9.6; Platelets 149; Potassium 5.0; Sodium 137  Recent Lipid Panel    Component Value Date/Time   CHOL 133 06/10/2019 0944   TRIG 59 06/10/2019 0944   HDL 46 06/10/2019 0944   CHOLHDL 2.9 06/10/2019 0944   CHOLHDL 2.8 02/17/2018 0345   VLDL 14 02/17/2018 0345   LDLCALC 74 06/10/2019 0944    Physical Exam:    VS:  BP 110/60 (BP Location: Right Arm, Patient Position: Sitting, Cuff Size: Normal)    Pulse 62    Ht 5\' 8"  (1.727 m)    Wt 141 lb (64 kg)    SpO2 95%    BMI 21.44 kg/m     Wt Readings from Last 3 Encounters:  07/15/19 141 lb (64 kg)  07/08/19 143 lb (64.9 kg)  06/10/19 140 lb (63.5 kg)     GEN: Well nourished, well developed in no acute distress HEENT: Normal NECK: No JVD; No carotid bruits LYMPHATICS: No lymphadenopathy CARDIAC: S1S2 noted,RRR, no murmurs, rubs, gallops RESPIRATORY:  Clear to auscultation without rales, wheezing or rhonchi  ABDOMEN: Soft, non-tender, non-distended, +bowel sounds, no guarding. EXTREMITIES: No edema, No cyanosis, no clubbing MUSCULOSKELETAL:  No edema; No deformity  SKIN: Warm and dry NEUROLOGIC:  Alert and oriented x 3, non-focal PSYCHIATRIC:   Normal affect, good insight  ASSESSMENT:    1. Coronary artery disease of native artery of native heart with stable angina pectoris ([place])   2. Essential hypertension   3. Mixed hyperlipidemia    PLAN:     1.  I suspect his chest discomfort may be stable angina.  He has had nitroglycerin  in the past and has had good relief with this with no side effects.  Therefore today I am going to start patient on Imdur 30 mg daily.  Also continue on his carvedilol 12.5 mg daily, 81 mg daily, as well as atorvastatin to milligrams daily.  2.  Hypertension-his blood pressure is below target on his current antihypertensive regimen we will continue this for now.  3.  Hyperlipidemia-continue patient on his atorvastatin 20 mg daily.  The patient is in agreement with the above plan. The patient left the office in stable condition.  The patient will follow up in 3 months.   Medication Adjustments/Labs and Tests Ordered: Current medicines are reviewed at length with the patient today.  Concerns regarding medicines are outlined above.  No orders of the defined types were placed in this encounter.  Meds ordered this encounter  Medications   isosorbide mononitrate (IMDUR) 30 MG 24 hr tablet    Sig: Take 1 tablet (30 mg total) by mouth daily.    Dispense:  90 tablet    Refill:  1    Patient Instructions  Medication Instructions:  Your physician has recommended you make the following change in your medication:   START: IMDUR (Isosorbide) 30 mg Take 1 tab daily  *If you need a refill on your cardiac medications before your next appointment, please call your pharmacy*  Lab Work: NOne If you have labs (blood work) drawn today and your tests are completely normal, you will receive your results only by:  [place] (if you have MyChart) OR  A paper copy in the mail If you have any lab test that is abnormal or we need to change your treatment, we will call you to review the  results.  Testing/Procedures: NOne  Follow-Up: At [facility], you and your health needs are our priority.  As part of our continuing mission to provide you with exceptional heart care, we have created designated Provider Care Teams.  These Care Teams include your primary Cardiologist (physician) and Advanced Practice Providers (APPs -  Physician Assistants and Nurse Practitioners) who all work together to provide you with the care you need, when you need it.  Your next appointment:   3 months  The format for your next appointment:   In Person  Provider:   [name], DO  Other Instructions Isosorbide Mononitrate extended-release tablets What is this medicine? ISOSORBIDE MONONITRATE (eye soe SOR bide mon oh NYE trate) is a vasodilator. It relaxes blood vessels, increasing the blood and oxygen supply to your heart. This medicine is used to prevent chest pain caused by angina. It will not help to stop an episode of chest pain. This medicine may be used for other purposes; ask your health care provider or pharmacist if you have questions. COMMON BRAND NAME(S): Imdur, Isotrate ER What should I tell my health care provider before I take this medicine? They need to know if you have any of these conditions:  previous heart attack or heart failure  an unusual or allergic reaction to isosorbide mononitrate, nitrates, other medicines, foods, dyes, or preservatives  pregnant or trying to get pregnant  breast-feeding How should I use this medicine? Take this medicine by mouth with a glass of water. Follow the directions on the prescription label. Do not crush or chew. Take your medicine at regular intervals. Do not take your medicine more often than directed. Do not stop taking this medicine except on the advice of your doctor or health care professional. Talk  to your pediatrician regarding the use of this medicine in children. Special care may be needed. Overdosage: If you think you have  taken too much of this medicine contact a poison control center or emergency room at once. NOTE: This medicine is only for you. Do not share this medicine with others. What if I miss a dose? If you miss a dose, take it as soon as you can. If it is almost time for your next dose, take only that dose. Do not take double or extra doses. What may interact with this medicine? Do not take this medicine with any of the following medications:  medicines used to treat erectile dysfunction (ED) like avanafil, sildenafil, tadalafil, and vardenafil  riociguat This medicine may also interact with the following medications:  medicines for high blood pressure  other medicines for angina or heart failure This list may not describe all possible interactions. Give your health care provider a list of all the medicines, herbs, non-prescription drugs, or dietary supplements you use. Also tell them if you smoke, drink alcohol, or use illegal drugs. Some items may interact with your medicine. What should I watch for while using this medicine? Check your heart rate and blood pressure regularly while you are taking this medicine. Ask your doctor or health care professional what your heart rate and blood pressure should be and when you should contact him or her. Tell your doctor or health care professional if you feel your medicine is no longer working. You may get dizzy. Do not drive, use machinery, or do anything that needs mental alertness until you know how this medicine affects you. To reduce the risk of dizzy or fainting spells, do not sit or stand up quickly, especially if you are an older patient. Alcohol can make you more dizzy, and increase flushing and rapid heartbeats. Avoid alcoholic drinks. Do not treat yourself for coughs, colds, or pain while you are taking this medicine without asking your doctor or health care professional for advice. Some ingredients may increase your blood pressure. What side effects  may I notice from receiving this medicine? Side effects that you should report to your doctor or health care professional as soon as possible:  bluish discoloration of lips, fingernails, or palms of hands  irregular heartbeat, palpitations  low blood pressure  nausea, vomiting  persistent headache  unusually weak or tired Side effects that usually do not require medical attention (report to your doctor or health care professional if they continue or are bothersome):  flushing of the face or neck  rash This list may not describe all possible side effects. Call your doctor for medical advice about side effects. You may report side effects to FDA at 1-800-FDA-1088. Where should I keep my medicine? Keep out of the reach of children. Store between 15 and 30 degrees C (59 and 86 degrees F). Keep container tightly closed. Throw away any unused medicine after the expiration date. NOTE: This sheet is a summary. It may not cover all possible information. If you have questions about this medicine, talk to your doctor, pharmacist, or health care provider.  2020 Elsevier/Gold Standard (2013-06-17 14:48:19)      Adopting a Healthy Lifestyle.  Know what a healthy weight is for you (roughly BMI <25) and aim to maintain this   Aim for 7+ servings of fruits and vegetables daily   65-80+ fluid ounces of water or unsweet tea for healthy kidneys   Limit to max 1 drink of alcohol per day;  avoid smoking/tobacco   Limit animal fats in diet for cholesterol and heart health - choose grass fed whenever available   Avoid highly processed foods, and foods high in saturated/trans fats   Aim for low stress - take time to unwind and care for your mental health   Aim for 150 min of moderate intensity exercise weekly for heart health, and weights twice weekly for bone health   Aim for 7-9 hours of sleep daily   When it comes to diets, agreement about the perfect plan isnt easy to find, even among  the experts. Experts at the [place] [employer] developed an idea known as the Healthy Eating Plate. Just imagine a plate divided into logical, healthy portions.   The emphasis is on diet quality:   Load up on vegetables and fruits - one-half of your plate: Aim for color and variety, and remember that potatoes dont count.   Go for whole grains - one-quarter of your plate: Whole wheat, barley, wheat berries, quinoa, oats, brown rice, and foods made with them. If you want pasta, go with whole wheat pasta.   Protein power - one-quarter of your plate: Fish, chicken, beans, and nuts are all healthy, versatile protein sources. Limit red meat.   The diet, however, does go beyond the plate, offering a few other suggestions.   Use healthy plant oils, such as olive, canola, soy, corn, sunflower and peanut. Check the labels, and avoid partially hydrogenated oil, which have unhealthy trans fats.   If youre thirsty, drink water. Coffee and tea are good in moderation, but skip sugary drinks and limit milk and dairy products to one or two daily servings.   The type of carbohydrate in the diet is more important than the amount. Some sources of carbohydrates, such as vegetables, fruits, whole grains, and beans-are healthier than others.   Finally, stay active  Signed, [name], DO  07/15/2019 11:19 AM    [place]

## 2019-07-16 DIAGNOSIS — E876 Hypokalemia: Secondary | ICD-10-CM | POA: Diagnosis not present

## 2019-07-16 DIAGNOSIS — N2581 Secondary hyperparathyroidism of renal origin: Secondary | ICD-10-CM | POA: Diagnosis not present

## 2019-07-16 DIAGNOSIS — D509 Iron deficiency anemia, unspecified: Secondary | ICD-10-CM | POA: Diagnosis not present

## 2019-07-16 DIAGNOSIS — Z992 Dependence on renal dialysis: Secondary | ICD-10-CM | POA: Diagnosis not present

## 2019-07-16 DIAGNOSIS — D631 Anemia in chronic kidney disease: Secondary | ICD-10-CM | POA: Diagnosis not present

## 2019-07-16 DIAGNOSIS — N186 End stage renal disease: Secondary | ICD-10-CM | POA: Diagnosis not present

## 2019-07-18 ENCOUNTER — Encounter (HOSPITAL_COMMUNITY)
Admission: RE | Disposition: A | Discharge status: Home / Self Care | Payer: Self-pay | Source: Home / Self Care | Attending: Vascular Surgery

## 2019-07-18 ENCOUNTER — Other Ambulatory Visit: Payer: Self-pay

## 2019-07-18 ENCOUNTER — Encounter (HOSPITAL_COMMUNITY): Payer: Self-pay | Admitting: *Deleted

## 2019-07-18 ENCOUNTER — Ambulatory Visit (HOSPITAL_COMMUNITY): Payer: Medicare Other | Admitting: Physician Assistant

## 2019-07-18 ENCOUNTER — Ambulatory Visit (HOSPITAL_COMMUNITY)
Admission: RE | Admit: 2019-07-18 | Discharge: 2019-07-18 | Disposition: A | Discharge status: Home / Self Care | Payer: Medicare Other | Attending: Vascular Surgery | Admitting: Vascular Surgery

## 2019-07-18 DIAGNOSIS — J45909 Unspecified asthma, uncomplicated: Secondary | ICD-10-CM | POA: Diagnosis not present

## 2019-07-18 DIAGNOSIS — Y841 Kidney dialysis as the cause of abnormal reaction of the patient, or of later complication, without mention of misadventure at the time of the procedure: Secondary | ICD-10-CM | POA: Insufficient documentation

## 2019-07-18 DIAGNOSIS — N186 End stage renal disease: Secondary | ICD-10-CM | POA: Insufficient documentation

## 2019-07-18 DIAGNOSIS — Z79899 Other long term (current) drug therapy: Secondary | ICD-10-CM | POA: Insufficient documentation

## 2019-07-18 DIAGNOSIS — E785 Hyperlipidemia, unspecified: Secondary | ICD-10-CM | POA: Insufficient documentation

## 2019-07-18 DIAGNOSIS — T82898A Other specified complication of vascular prosthetic devices, implants and grafts, initial encounter: Secondary | ICD-10-CM | POA: Diagnosis not present

## 2019-07-18 DIAGNOSIS — Z7951 Long term (current) use of inhaled steroids: Secondary | ICD-10-CM | POA: Diagnosis not present

## 2019-07-18 DIAGNOSIS — Z8673 Personal history of transient ischemic attack (TIA), and cerebral infarction without residual deficits: Secondary | ICD-10-CM | POA: Insufficient documentation

## 2019-07-18 DIAGNOSIS — K219 Gastro-esophageal reflux disease without esophagitis: Secondary | ICD-10-CM | POA: Diagnosis not present

## 2019-07-18 DIAGNOSIS — G2581 Restless legs syndrome: Secondary | ICD-10-CM | POA: Diagnosis not present

## 2019-07-18 DIAGNOSIS — I5032 Chronic diastolic (congestive) heart failure: Secondary | ICD-10-CM | POA: Insufficient documentation

## 2019-07-18 DIAGNOSIS — I712 Thoracic aortic aneurysm, without rupture: Secondary | ICD-10-CM | POA: Insufficient documentation

## 2019-07-18 DIAGNOSIS — Z992 Dependence on renal dialysis: Secondary | ICD-10-CM | POA: Insufficient documentation

## 2019-07-18 DIAGNOSIS — T82510A Breakdown (mechanical) of surgically created arteriovenous fistula, initial encounter: Secondary | ICD-10-CM | POA: Insufficient documentation

## 2019-07-18 DIAGNOSIS — I132 Hypertensive heart and chronic kidney disease with heart failure and with stage 5 chronic kidney disease, or end stage renal disease: Secondary | ICD-10-CM | POA: Insufficient documentation

## 2019-07-18 HISTORY — PX: REVISON OF ARTERIOVENOUS FISTULA: SHX6074

## 2019-07-18 LAB — POCT I-STAT, CHEM 8
BUN: 44 mg/dL — ABNORMAL HIGH (ref 6–20)
Calcium, Ion: 0.98 mmol/L — ABNORMAL LOW (ref 1.15–1.40)
Chloride: 97 mmol/L — ABNORMAL LOW (ref 98–111)
Creatinine, Ser: 8.9 mg/dL — ABNORMAL HIGH (ref 0.61–1.24)
Glucose, Bld: 85 mg/dL (ref 70–99)
HCT: 37 % — ABNORMAL LOW (ref 39.0–52.0)
Hemoglobin: 12.6 g/dL — ABNORMAL LOW (ref 13.0–17.0)
Potassium: 4.3 mmol/L (ref 3.5–5.1)
Sodium: 136 mmol/L (ref 135–145)
TCO2: 26 mmol/L (ref 22–32)

## 2019-07-18 SURGERY — REVISON OF ARTERIOVENOUS FISTULA
Anesthesia: General | Site: Arm Lower | Laterality: Left

## 2019-07-18 MED ORDER — LIDOCAINE HCL (CARDIAC) PF 100 MG/5ML IV SOSY
PREFILLED_SYRINGE | INTRAVENOUS | Status: DC | PRN
  Administered 2019-07-18: 80 mg via INTRAVENOUS

## 2019-07-18 MED ORDER — HEPARIN SODIUM (PORCINE) 1000 UNIT/ML IJ SOLN
INTRAMUSCULAR | Status: DC | PRN
  Administered 2019-07-18: 6000 [IU] via INTRAVENOUS

## 2019-07-18 MED ORDER — MIDAZOLAM HCL 5 MG/5ML IJ SOLN
INTRAMUSCULAR | Status: DC | PRN
  Administered 2019-07-18: 2 mg via INTRAVENOUS

## 2019-07-18 MED ORDER — DEXAMETHASONE SODIUM PHOSPHATE 4 MG/ML IJ SOLN
INTRAMUSCULAR | Status: DC | PRN
  Administered 2019-07-18: 4 mg via INTRAVENOUS

## 2019-07-18 MED ORDER — SODIUM CHLORIDE 0.9 % IV SOLN
INTRAVENOUS | Status: DC | PRN
  Administered 2019-07-18: 500 mL

## 2019-07-18 MED ORDER — LIDOCAINE 2% (20 MG/ML) 5 ML SYRINGE
INTRAMUSCULAR | Status: AC
  Filled 2019-07-18: qty 5

## 2019-07-18 MED ORDER — ONDANSETRON HCL 4 MG/2ML IJ SOLN
INTRAMUSCULAR | Status: AC
  Filled 2019-07-18: qty 2

## 2019-07-18 MED ORDER — HEPARIN SODIUM (PORCINE) 1000 UNIT/ML IJ SOLN
INTRAMUSCULAR | Status: AC
  Filled 2019-07-18: qty 1

## 2019-07-18 MED ORDER — MIDAZOLAM HCL 2 MG/2ML IJ SOLN
INTRAMUSCULAR | Status: AC
  Filled 2019-07-18: qty 2

## 2019-07-18 MED ORDER — EPHEDRINE SULFATE-NACL 50-0.9 MG/10ML-% IV SOSY
PREFILLED_SYRINGE | INTRAVENOUS | Status: DC | PRN
  Administered 2019-07-18 (×2): 10 mg via INTRAVENOUS
  Administered 2019-07-18: 15 mg via INTRAVENOUS

## 2019-07-18 MED ORDER — EPHEDRINE 5 MG/ML INJ
INTRAVENOUS | Status: AC
  Filled 2019-07-18: qty 10

## 2019-07-18 MED ORDER — PROPOFOL 10 MG/ML IV BOLUS
INTRAVENOUS | Status: DC | PRN
  Administered 2019-07-18: 160 mg via INTRAVENOUS
  Administered 2019-07-18: 40 mg via INTRAVENOUS

## 2019-07-18 MED ORDER — PROTAMINE SULFATE 10 MG/ML IV SOLN
INTRAVENOUS | Status: DC | PRN
  Administered 2019-07-18 (×3): 10 mg via INTRAVENOUS

## 2019-07-18 MED ORDER — OXYCODONE HCL 5 MG PO TABS: 5.0000 mg | ORAL_TABLET | ORAL | 0 refills | Status: DC | PRN

## 2019-07-18 MED ORDER — ALBUMIN HUMAN 5 % IV SOLN
INTRAVENOUS | Status: DC | PRN
  Administered 2019-07-18: 10:00:00 via INTRAVENOUS

## 2019-07-18 MED ORDER — DEXAMETHASONE SODIUM PHOSPHATE 10 MG/ML IJ SOLN
INTRAMUSCULAR | Status: AC
  Filled 2019-07-18: qty 1

## 2019-07-18 MED ORDER — PHENYLEPHRINE 40 MCG/ML (10ML) SYRINGE FOR IV PUSH (FOR BLOOD PRESSURE SUPPORT)
PREFILLED_SYRINGE | INTRAVENOUS | Status: DC | PRN
  Administered 2019-07-18 (×3): 80 ug via INTRAVENOUS

## 2019-07-18 MED ORDER — ONDANSETRON HCL 4 MG/2ML IJ SOLN
INTRAMUSCULAR | Status: DC | PRN
  Administered 2019-07-18: 4 mg via INTRAVENOUS

## 2019-07-18 MED ORDER — PHENYLEPHRINE 40 MCG/ML (10ML) SYRINGE FOR IV PUSH (FOR BLOOD PRESSURE SUPPORT)
PREFILLED_SYRINGE | INTRAVENOUS | Status: AC
  Filled 2019-07-18: qty 10

## 2019-07-18 MED ORDER — 0.9 % SODIUM CHLORIDE (POUR BTL) OPTIME
TOPICAL | Status: DC | PRN
  Administered 2019-07-18: 1000 mL

## 2019-07-18 MED ORDER — SODIUM CHLORIDE 0.9 % IV SOLN
INTRAVENOUS | Status: AC
  Filled 2019-07-18: qty 1.2

## 2019-07-18 MED ORDER — PHENYLEPHRINE HCL-NACL 10-0.9 MG/250ML-% IV SOLN
INTRAVENOUS | Status: DC | PRN
  Administered 2019-07-18: 50 ug/min via INTRAVENOUS

## 2019-07-18 MED ORDER — FENTANYL CITRATE (PF) 250 MCG/5ML IJ SOLN
INTRAMUSCULAR | Status: AC
  Filled 2019-07-18: qty 5

## 2019-07-18 MED ORDER — LIDOCAINE-EPINEPHRINE 0.5 %-1:200000 IJ SOLN
INTRAMUSCULAR | Status: AC
  Filled 2019-07-18: qty 1

## 2019-07-18 MED ORDER — OXYCODONE HCL 5 MG PO TABS
ORAL_TABLET | ORAL | Status: AC
  Filled 2019-07-18: qty 1

## 2019-07-18 MED ORDER — SODIUM CHLORIDE 0.9 % IV SOLN
INTRAVENOUS | Status: DC
  Administered 2019-07-18 (×2): via INTRAVENOUS

## 2019-07-18 MED ORDER — PROPOFOL 10 MG/ML IV BOLUS
INTRAVENOUS | Status: AC
  Filled 2019-07-18: qty 20

## 2019-07-18 MED ORDER — FENTANYL CITRATE (PF) 100 MCG/2ML IJ SOLN: 25.0000 ug | INTRAMUSCULAR | Status: DC | PRN

## 2019-07-18 MED ORDER — OXYCODONE HCL 5 MG PO TABS
5.0000 mg | ORAL_TABLET | Freq: Once | ORAL | Status: AC
  Administered 2019-07-18: 11:00:00 5 mg via ORAL

## 2019-07-18 MED ORDER — CEFAZOLIN SODIUM-DEXTROSE 2-4 GM/100ML-% IV SOLN
2.0000 g | INTRAVENOUS | Status: AC
  Administered 2019-07-18: 2 g via INTRAVENOUS
  Filled 2019-07-18: qty 100

## 2019-07-18 SURGICAL SUPPLY — 36 items
ARMBAND PINK RESTRICT EXTREMIT (MISCELLANEOUS) ×3 IMPLANT
CANISTER SUCT 3000ML PPV (MISCELLANEOUS) ×3 IMPLANT
CANNULA VESSEL 3MM 2 BLNT TIP (CANNULA) ×3 IMPLANT
CLIP VESOCCLUDE MED 6/CT (CLIP) ×3 IMPLANT
CLIP VESOCCLUDE SM WIDE 6/CT (CLIP) ×3 IMPLANT
COVER PROBE W GEL 5X96 (DRAPES) IMPLANT
COVER WAND RF STERILE (DRAPES) IMPLANT
DERMABOND ADVANCED (GAUZE/BANDAGES/DRESSINGS) ×2
DERMABOND ADVANCED .7 DNX12 (GAUZE/BANDAGES/DRESSINGS) ×1 IMPLANT
ELECT REM PT RETURN 9FT ADLT (ELECTROSURGICAL) ×3
ELECTRODE REM PT RTRN 9FT ADLT (ELECTROSURGICAL) ×1 IMPLANT
GLOVE BIO SURGEON STRL SZ7.5 (GLOVE) ×6 IMPLANT
GLOVE BIOGEL PI IND STRL 6 (GLOVE) ×1 IMPLANT
GLOVE BIOGEL PI IND STRL 6.5 (GLOVE) ×2 IMPLANT
GLOVE BIOGEL PI IND STRL 8 (GLOVE) ×1 IMPLANT
GLOVE BIOGEL PI INDICATOR 6 (GLOVE) ×2
GLOVE BIOGEL PI INDICATOR 6.5 (GLOVE) ×4
GLOVE BIOGEL PI INDICATOR 8 (GLOVE) ×2
GOWN STRL REUS W/ TWL LRG LVL3 (GOWN DISPOSABLE) ×3 IMPLANT
GOWN STRL REUS W/ TWL XL LVL3 (GOWN DISPOSABLE) ×1 IMPLANT
GOWN STRL REUS W/TWL LRG LVL3 (GOWN DISPOSABLE) ×6
GOWN STRL REUS W/TWL XL LVL3 (GOWN DISPOSABLE) ×2
KIT BASIN OR (CUSTOM PROCEDURE TRAY) ×3 IMPLANT
KIT TURNOVER KIT B (KITS) ×3 IMPLANT
NS IRRIG 1000ML POUR BTL (IV SOLUTION) ×3 IMPLANT
PACK CV ACCESS (CUSTOM PROCEDURE TRAY) ×3 IMPLANT
PAD ARMBOARD 7.5X6 YLW CONV (MISCELLANEOUS) ×6 IMPLANT
SPONGE SURGIFOAM ABS GEL 100 (HEMOSTASIS) IMPLANT
SUT PROLENE 5 0 C 1 24 (SUTURE) ×3 IMPLANT
SUT PROLENE 6 0 BV (SUTURE) ×3 IMPLANT
SUT VIC AB 3-0 SH 27 (SUTURE) ×4
SUT VIC AB 3-0 SH 27X BRD (SUTURE) ×2 IMPLANT
SUT VICRYL 4-0 PS2 18IN ABS (SUTURE) ×3 IMPLANT
TOWEL GREEN STERILE (TOWEL DISPOSABLE) ×3 IMPLANT
UNDERPAD 30X30 (UNDERPADS AND DIAPERS) ×3 IMPLANT
WATER STERILE IRR 1000ML POUR (IV SOLUTION) ×3 IMPLANT

## 2019-07-18 NOTE — Transfer of Care (Signed)
Immediate Anesthesia Transfer of Care Note  Patient: Thomas Robinson  Procedure(s) Performed: REVISION PLICATION OF RADIOCEPHALIC ARTERIOVENOUS FISTULA LEFT ARM (Left Arm Lower)  Patient Location: PACU  Anesthesia Type:General  Level of Consciousness: oriented, drowsy and patient cooperative  Airway & Oxygen Therapy: Patient Spontanous Breathing and Patient connected to nasal cannula oxygen  Post-op Assessment: Report given to RN and Post -op Vital signs reviewed and stable  Post vital signs: Reviewed  Last Vitals:  Vitals Value Taken Time  BP 149/74 07/18/19 1113  Temp    Pulse 67 07/18/19 1115  Resp 13 07/18/19 1115  SpO2 94 % 07/18/19 1115  Vitals shown include unvalidated device data.  Last Pain:  Vitals:   07/18/19 1115  TempSrc:   PainSc: (P) Asleep      Patients Stated Pain Goal: 5 (59/74/71 8550)  Complications: No apparent anesthesia complications

## 2019-07-18 NOTE — Anesthesia Postprocedure Evaluation (Signed)
Anesthesia Post Note  Patient: Thomas Robinson  Procedure(s) Performed: REVISION PLICATION OF RADIOCEPHALIC ARTERIOVENOUS FISTULA LEFT ARM (Left Arm Lower)     Patient location during evaluation: PACU Anesthesia Type: General Level of consciousness: awake Pain management: pain level controlled Vital Signs Assessment: post-procedure vital signs reviewed and stable Respiratory status: spontaneous breathing Cardiovascular status: stable Postop Assessment: no apparent nausea or vomiting Anesthetic complications: no    Last Vitals:  Vitals:   07/18/19 1200 07/18/19 1210  BP: 133/72 (!) 141/76  Pulse: 63 60  Resp: 13 14  Temp: 36.6 C   SpO2: 94% 92%    Last Pain:  Vitals:   07/18/19 1145  TempSrc:   PainSc: 0-No pain                 Fatuma Dowers

## 2019-07-18 NOTE — Op Note (Signed)
    NAME: Thomas Robinson    MRN: [id number] DOB: Aug 31, 1961    DATE OF OPERATION: 07/18/2019  PREOP DIAGNOSIS:    Aneurysm of left radiocephalic AV fistula  POSTOP DIAGNOSIS:    Same  PROCEDURE:    Plication of aneurysm of left radiocephalic fistula  SURGEON: Judeth Cornfield. Scot Dock, MD  ASSIST: Arlee Muslim, PA  ANESTHESIA: General  EBL: Minimal  INDICATIONS:    [name] is a 58 y.o. male who was seen in the office with an aneurysm of his left forearm fistula.  He was set up for plication.  FINDINGS:   Excellent thrill in the fistula at the completion of the procedure.  TECHNIQUE:   The patient was taken to the operating room and received a general anesthetic.  The left arm was prepped and draped in usual sterile fashion.  Incision was marked that was 3 times the length is the width over this aneurysm in the proximal forearm.  The aneurysm was dissected free circumferentially.  The patient was heparinized.  I marked the anterior aspect of the fistula and then rotated the vein 180 degrees.  An ellipse of vein was excised from the posterior aspect of the fistula.  This was then closed with running 5-0 Prolene suture.  Clamps were then released.  There was an excellent thrill in the fistula.  Hemostasis was obtained in the wound.  The heparin was partially reversed with protamine.  The wound was then closed with a deep layer of 3-0 Vicryl and the skin closed with 4-0 Vicryl.  Dermabond was applied.  The patient tolerated the procedure well and was transferred to the recovery room in stable condition.  All needle and sponge counts were correct.  Deitra Mayo, MD, FACS [facility]  DATE OF DICTATION:   07/18/2019

## 2019-07-18 NOTE — Anesthesia Procedure Notes (Signed)
Procedure Name: LMA Insertion Date/Time: 07/18/2019 10:04 AM Performed by: Jenne Campus, CRNA Pre-anesthesia Checklist: Patient identified, Emergency Drugs available, Suction available and Patient being monitored Patient Re-evaluated:Patient Re-evaluated prior to induction Oxygen Delivery Method: Circle System Utilized Preoxygenation: Pre-oxygenation with 100% oxygen Induction Type: IV induction Ventilation: Mask ventilation without difficulty LMA: LMA inserted LMA Size: 4.0 Number of attempts: 1 Airway Equipment and Method: Bite block Placement Confirmation: positive ETCO2 and breath sounds checked- equal and bilateral Tube secured with: Tape Dental Injury: Teeth and Oropharynx as per pre-operative assessment

## 2019-07-18 NOTE — Interval H&P Note (Signed)
History and Physical Interval Note:  07/18/2019 9:27 AM  Thomas Robinson  has presented today for surgery, with the diagnosis of left arm pseudoaneurysm.  The various methods of treatment have been discussed with the patient and family. After consideration of risks, benefits and other options for treatment, the patient has consented to  Procedure(s): REVISION PLICATION OF RADIOCEPHALIC ARTERIOVENOUS FISTULA LEFT ARM (Left) as a surgical intervention.  The patient's history has been reviewed, patient examined, no change in status, stable for surgery.  I have reviewed the patient's chart and labs.  Questions were answered to the patient's satisfaction.     Deitra Mayo

## 2019-07-19 ENCOUNTER — Encounter (HOSPITAL_COMMUNITY): Payer: Self-pay | Admitting: Vascular Surgery

## 2019-07-21 DIAGNOSIS — N2581 Secondary hyperparathyroidism of renal origin: Secondary | ICD-10-CM | POA: Diagnosis not present

## 2019-07-21 DIAGNOSIS — E876 Hypokalemia: Secondary | ICD-10-CM | POA: Diagnosis not present

## 2019-07-21 DIAGNOSIS — N186 End stage renal disease: Secondary | ICD-10-CM | POA: Diagnosis not present

## 2019-07-21 DIAGNOSIS — Z992 Dependence on renal dialysis: Secondary | ICD-10-CM | POA: Diagnosis not present

## 2019-07-21 DIAGNOSIS — D509 Iron deficiency anemia, unspecified: Secondary | ICD-10-CM | POA: Diagnosis not present

## 2019-07-21 DIAGNOSIS — D631 Anemia in chronic kidney disease: Secondary | ICD-10-CM | POA: Diagnosis not present

## 2019-07-25 DIAGNOSIS — N2581 Secondary hyperparathyroidism of renal origin: Secondary | ICD-10-CM | POA: Diagnosis not present

## 2019-07-25 DIAGNOSIS — D631 Anemia in chronic kidney disease: Secondary | ICD-10-CM | POA: Diagnosis not present

## 2019-07-25 DIAGNOSIS — E876 Hypokalemia: Secondary | ICD-10-CM | POA: Diagnosis not present

## 2019-07-25 DIAGNOSIS — Z992 Dependence on renal dialysis: Secondary | ICD-10-CM | POA: Diagnosis not present

## 2019-07-25 DIAGNOSIS — N186 End stage renal disease: Secondary | ICD-10-CM | POA: Diagnosis not present

## 2019-07-25 DIAGNOSIS — D509 Iron deficiency anemia, unspecified: Secondary | ICD-10-CM | POA: Diagnosis not present

## 2019-07-27 DIAGNOSIS — N186 End stage renal disease: Secondary | ICD-10-CM | POA: Diagnosis not present

## 2019-07-27 DIAGNOSIS — D509 Iron deficiency anemia, unspecified: Secondary | ICD-10-CM | POA: Diagnosis not present

## 2019-07-27 DIAGNOSIS — D631 Anemia in chronic kidney disease: Secondary | ICD-10-CM | POA: Diagnosis not present

## 2019-07-27 DIAGNOSIS — Z992 Dependence on renal dialysis: Secondary | ICD-10-CM | POA: Diagnosis not present

## 2019-07-27 DIAGNOSIS — E876 Hypokalemia: Secondary | ICD-10-CM | POA: Diagnosis not present

## 2019-07-27 DIAGNOSIS — N2581 Secondary hyperparathyroidism of renal origin: Secondary | ICD-10-CM | POA: Diagnosis not present

## 2019-07-30 DIAGNOSIS — D509 Iron deficiency anemia, unspecified: Secondary | ICD-10-CM | POA: Diagnosis not present

## 2019-07-30 DIAGNOSIS — Z992 Dependence on renal dialysis: Secondary | ICD-10-CM | POA: Diagnosis not present

## 2019-07-30 DIAGNOSIS — N2581 Secondary hyperparathyroidism of renal origin: Secondary | ICD-10-CM | POA: Diagnosis not present

## 2019-07-30 DIAGNOSIS — D631 Anemia in chronic kidney disease: Secondary | ICD-10-CM | POA: Diagnosis not present

## 2019-07-30 DIAGNOSIS — N186 End stage renal disease: Secondary | ICD-10-CM | POA: Diagnosis not present

## 2019-07-30 DIAGNOSIS — E876 Hypokalemia: Secondary | ICD-10-CM | POA: Diagnosis not present

## 2019-08-08 IMAGING — CR CERVICAL SPINE - COMPLETE 4+ VIEW
5 series · 5 of 5 positions shown · non-contrast
Comparison: None.

CLINICAL DATA: Gradual increase in neck and back pain

EXAM:
CERVICAL SPINE - COMPLETE 4+ VIEW

[w cervical spine lat]
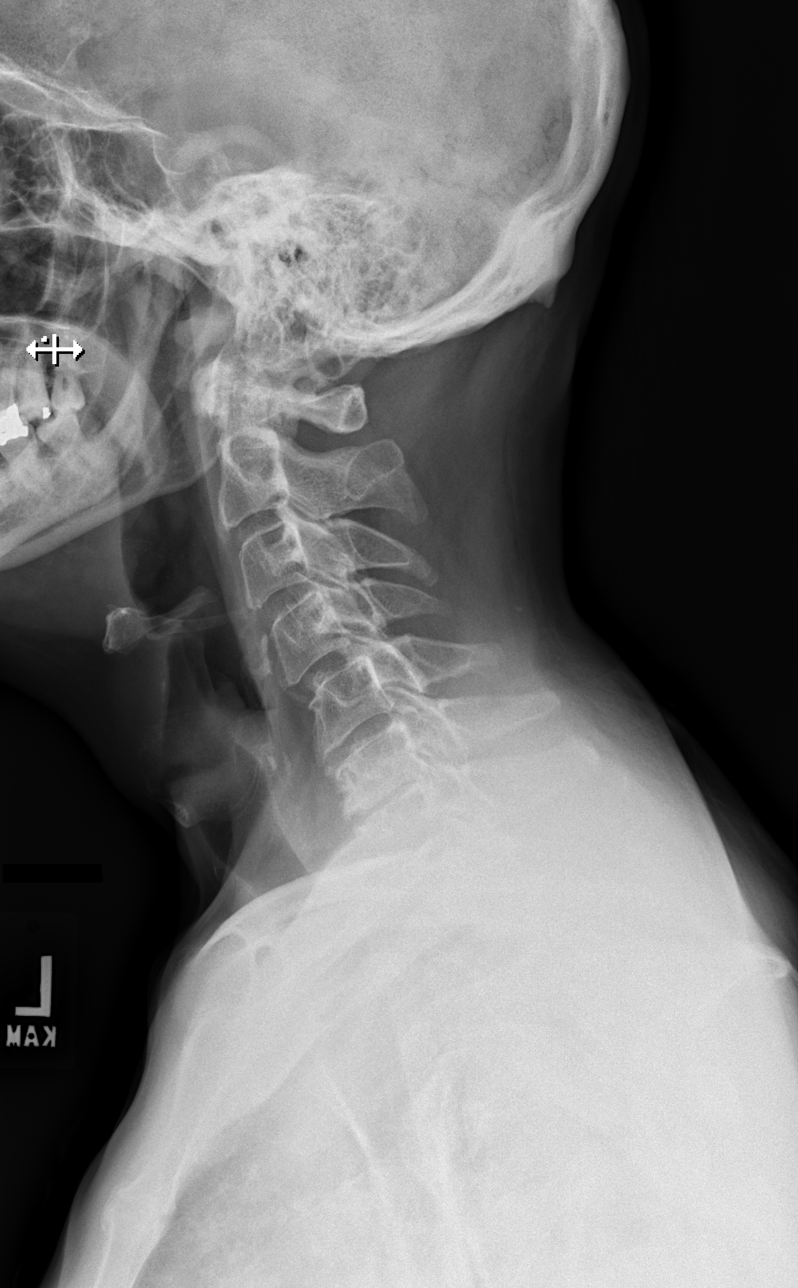

[w cervical spine ap_obl (1 of 2)]
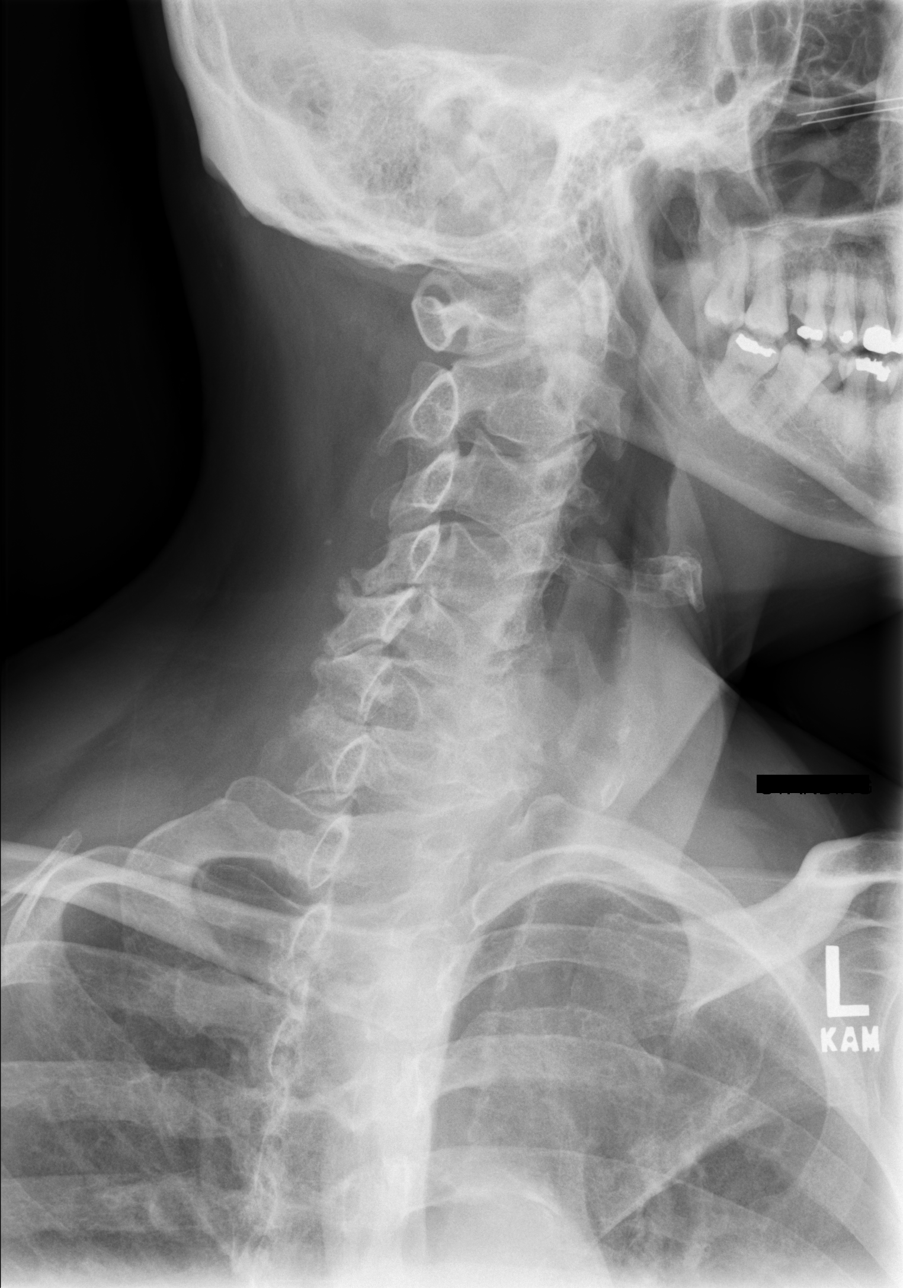

[w cervical spine ap_obl (2 of 2)]
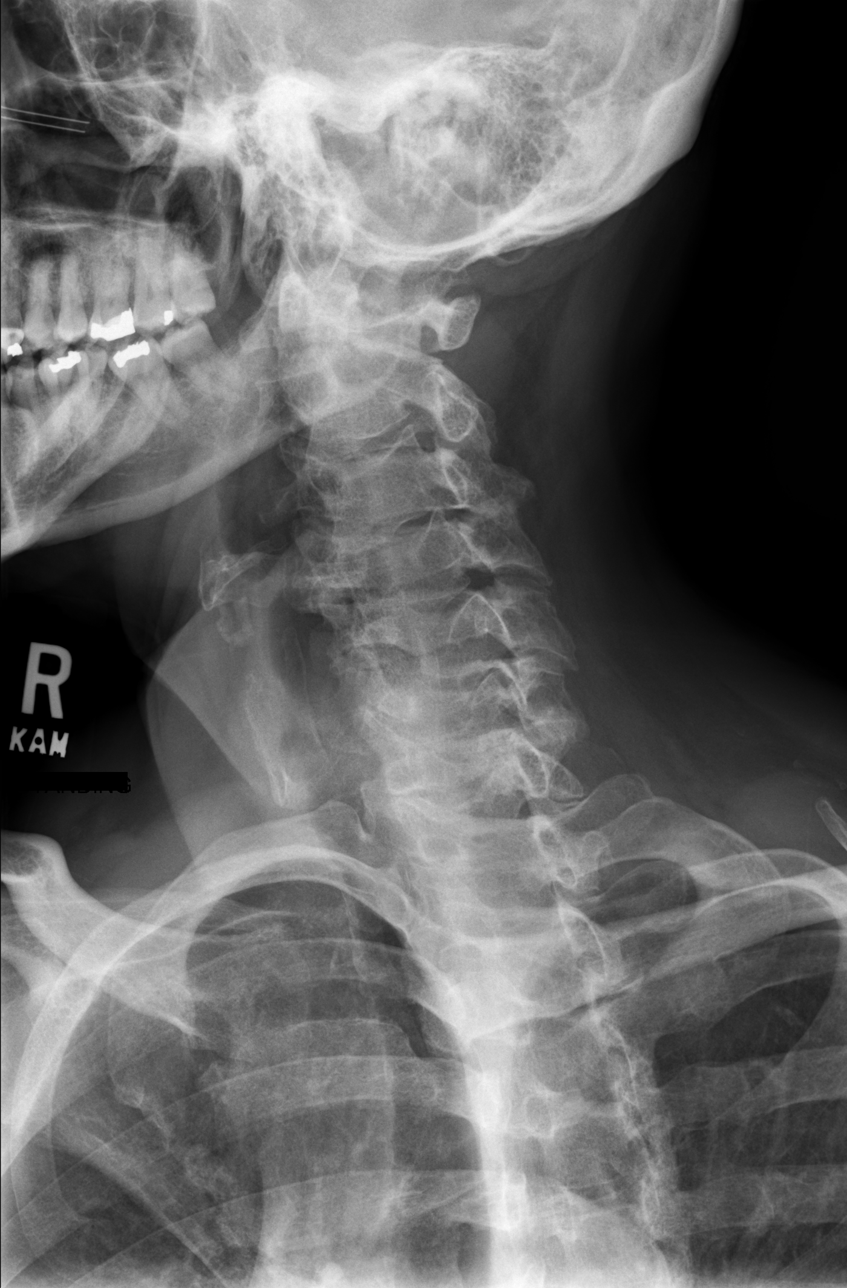

[w cervical spine ap]
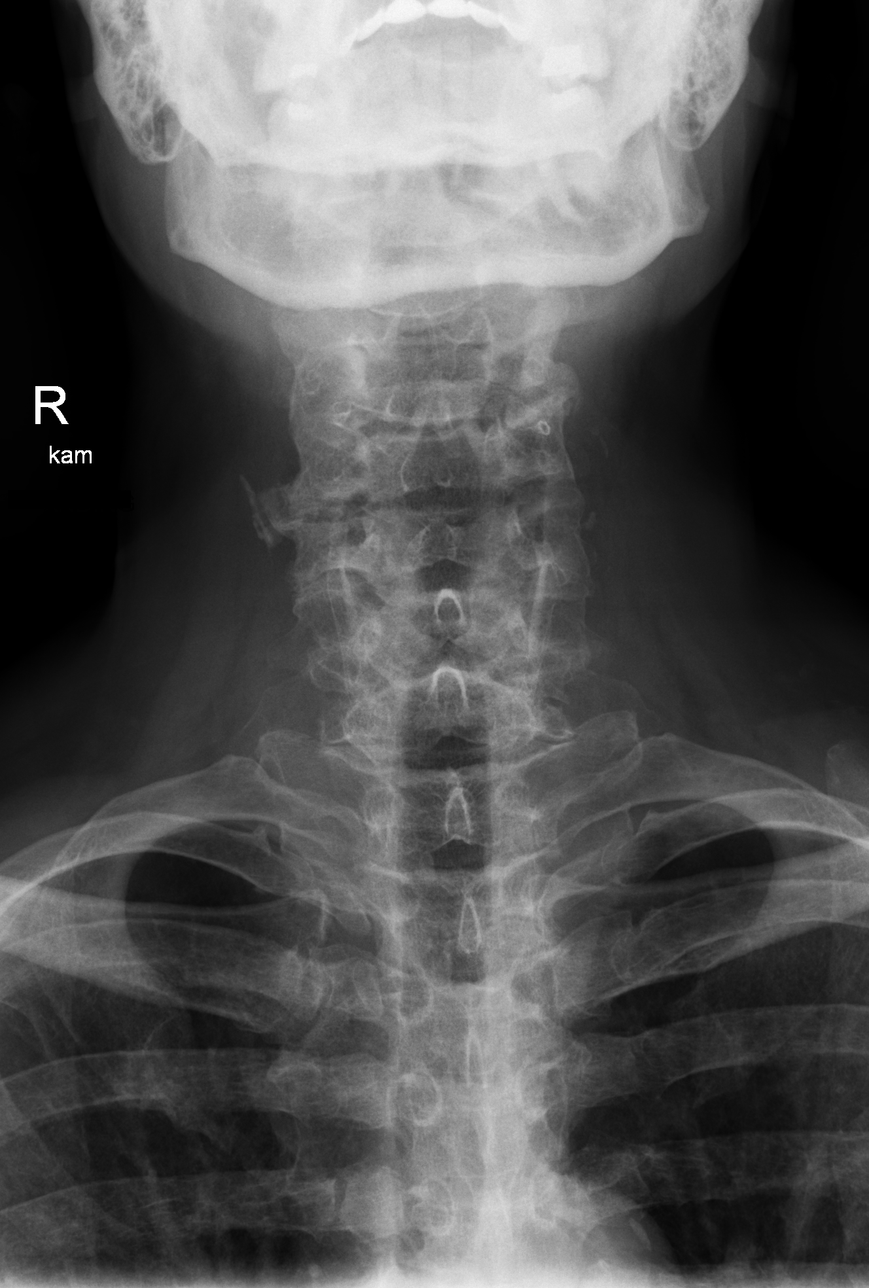

[w cervical spine odontoid]
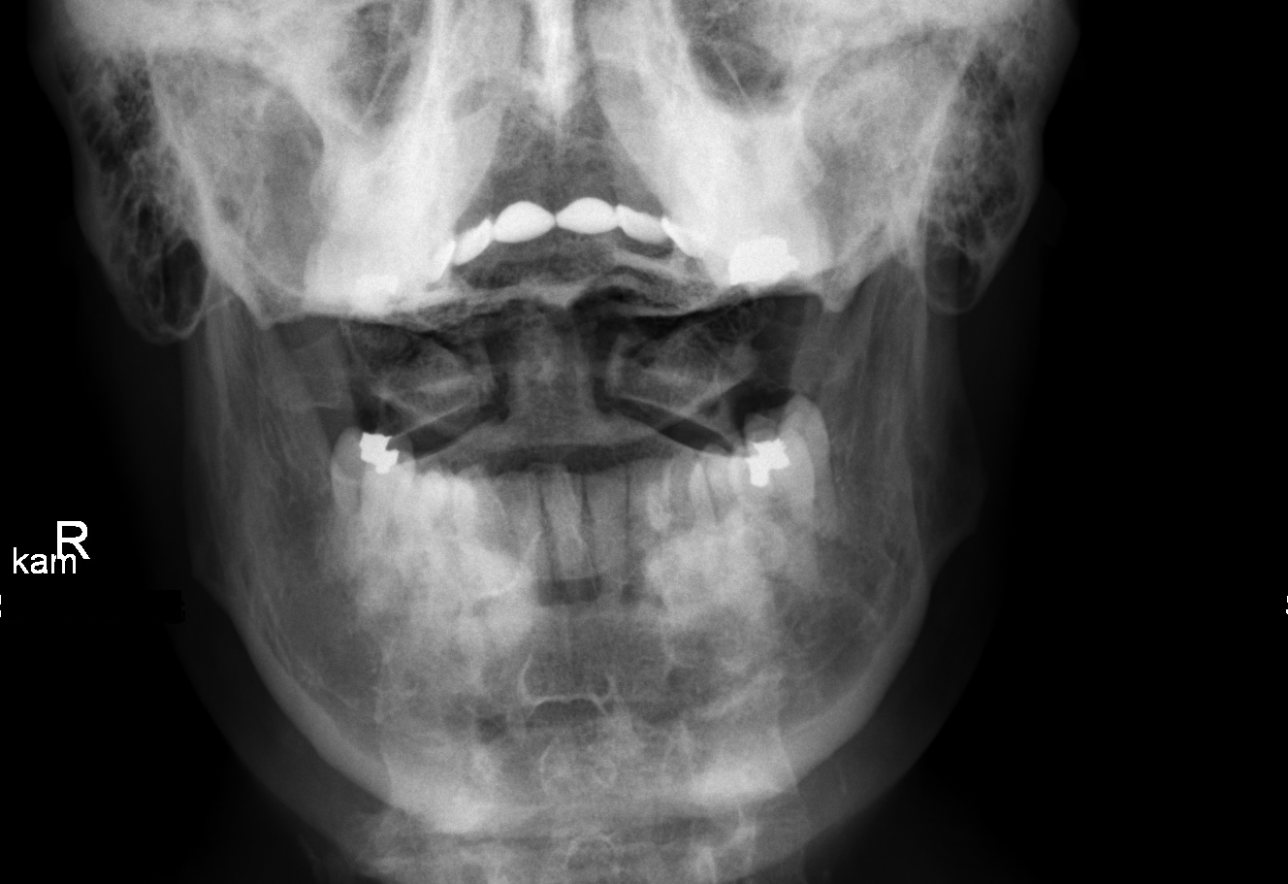

[5 of 5 positions shown; findings below may reference images not displayed]

FINDINGS: Degenerative disc disease changes in the lower cervical spine with
disc space narrowing and spurring, most pronounced at C6-7.
Degenerative facet disease bilaterally. No fracture or subluxation.
Prevertebral soft tissues are normal.
IMPRESSION: Degenerative disc and facet disease.  No acute bony abnormality.

## 2019-08-08 IMAGING — CR LUMBAR SPINE - COMPLETE 4+ VIEW
5 series · 5 of 5 positions shown · non-contrast
Comparison: None.

CLINICAL DATA: Low back pain

EXAM:
LUMBAR SPINE - COMPLETE 4+ VIEW

[w lumbar spine ap]
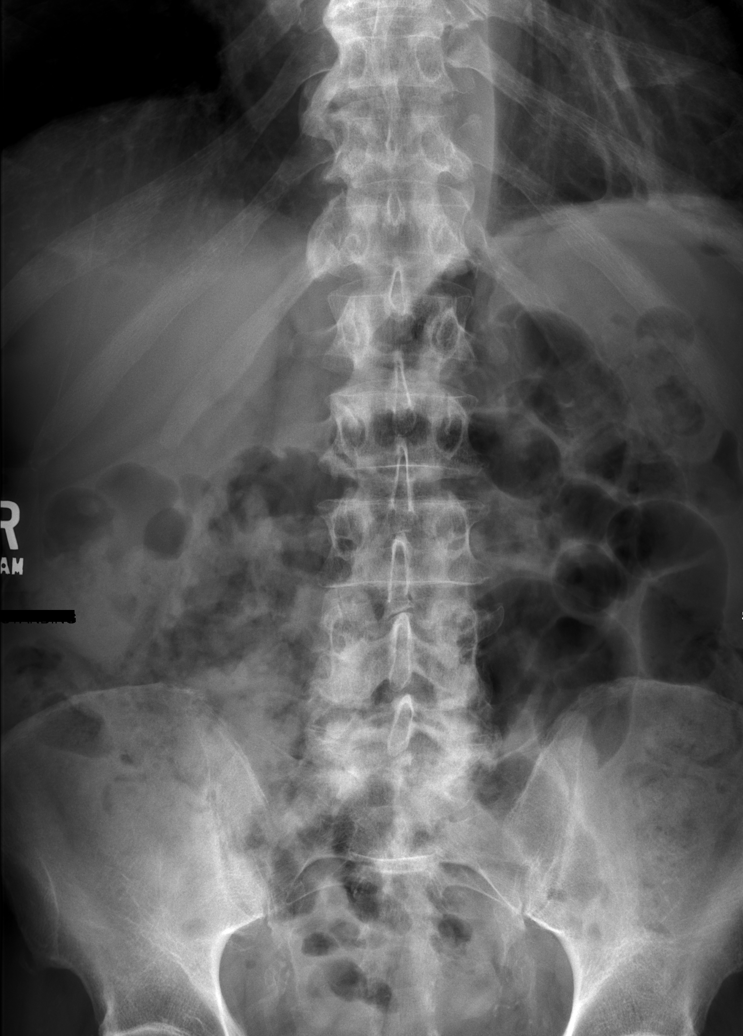

[w lumbar spine obl (1 of 2)]
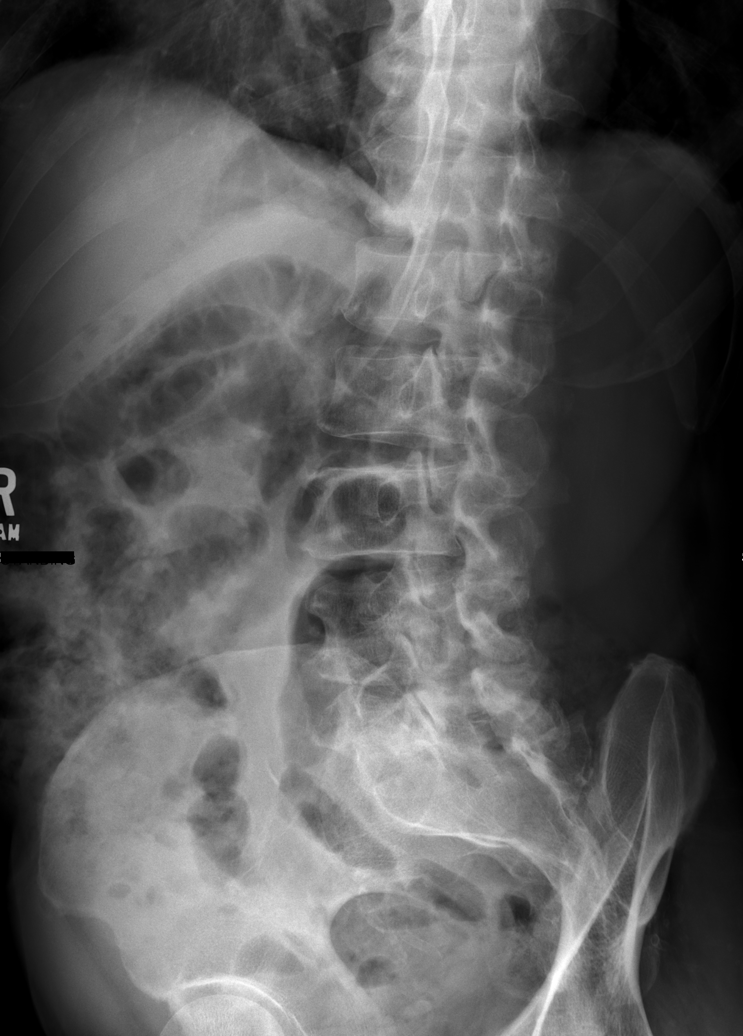

[w lumbar spine obl (2 of 2)]
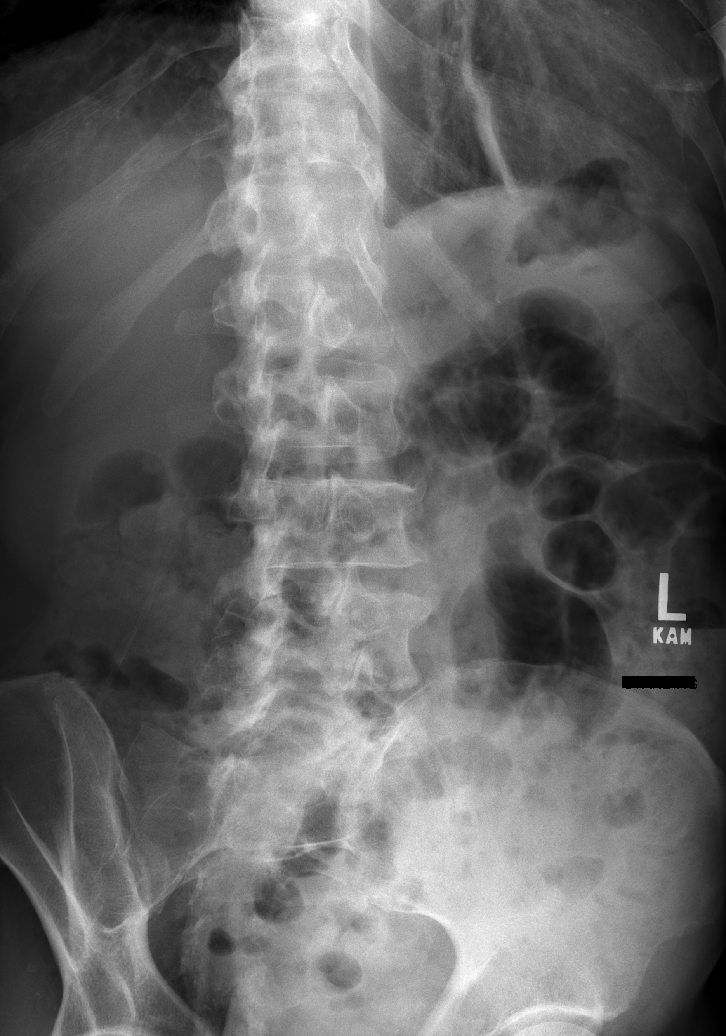

[w lumbar spine lat]
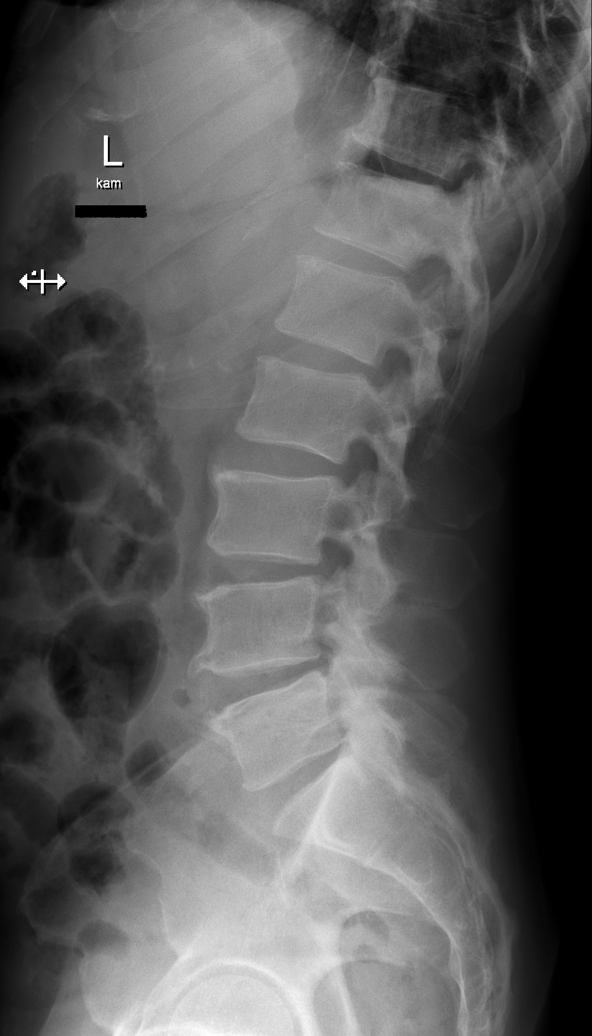

[w lumbar l-5 s-1 spot]
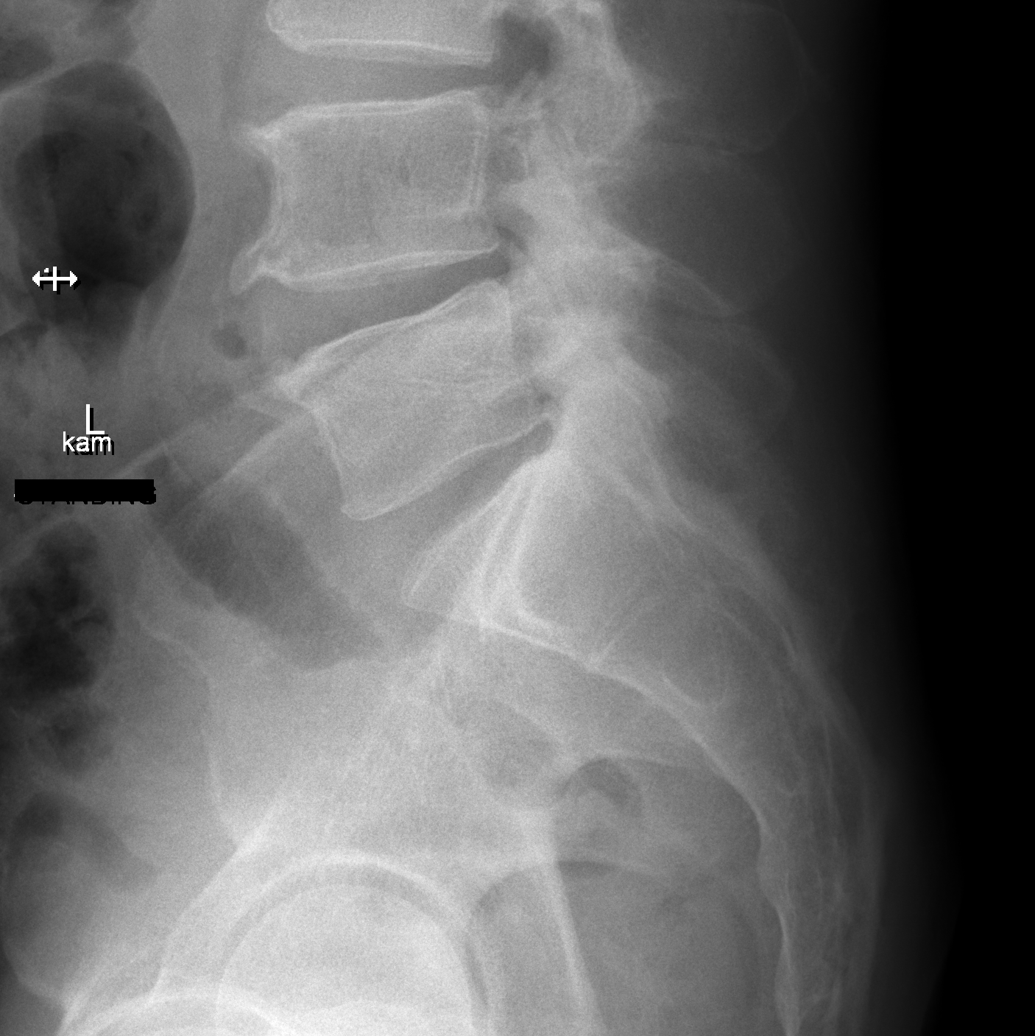

[5 of 5 positions shown; findings below may reference images not displayed]

FINDINGS: Degenerative facet disease throughout the lumbar spine, most notable
in the lower lumbar spine. Degenerative disc disease changes with
anterior spurring at L3-4 through L5-S1. Normal alignment. No
fracture. SI joints symmetric and unremarkable.
IMPRESSION: Degenerative disc and facet disease as above. No acute bony
abnormality.

## 2019-08-08 IMAGING — CR THORACIC SPINE - 3 VIEWS
3 series · 3 of 3 positions shown · non-contrast
Comparison: Chest x-ray 04/13/2018

CLINICAL DATA: Neck, upper and lower back pain.

EXAM:
THORACIC SPINE - 3 VIEWS

[w thoracic spine ap]
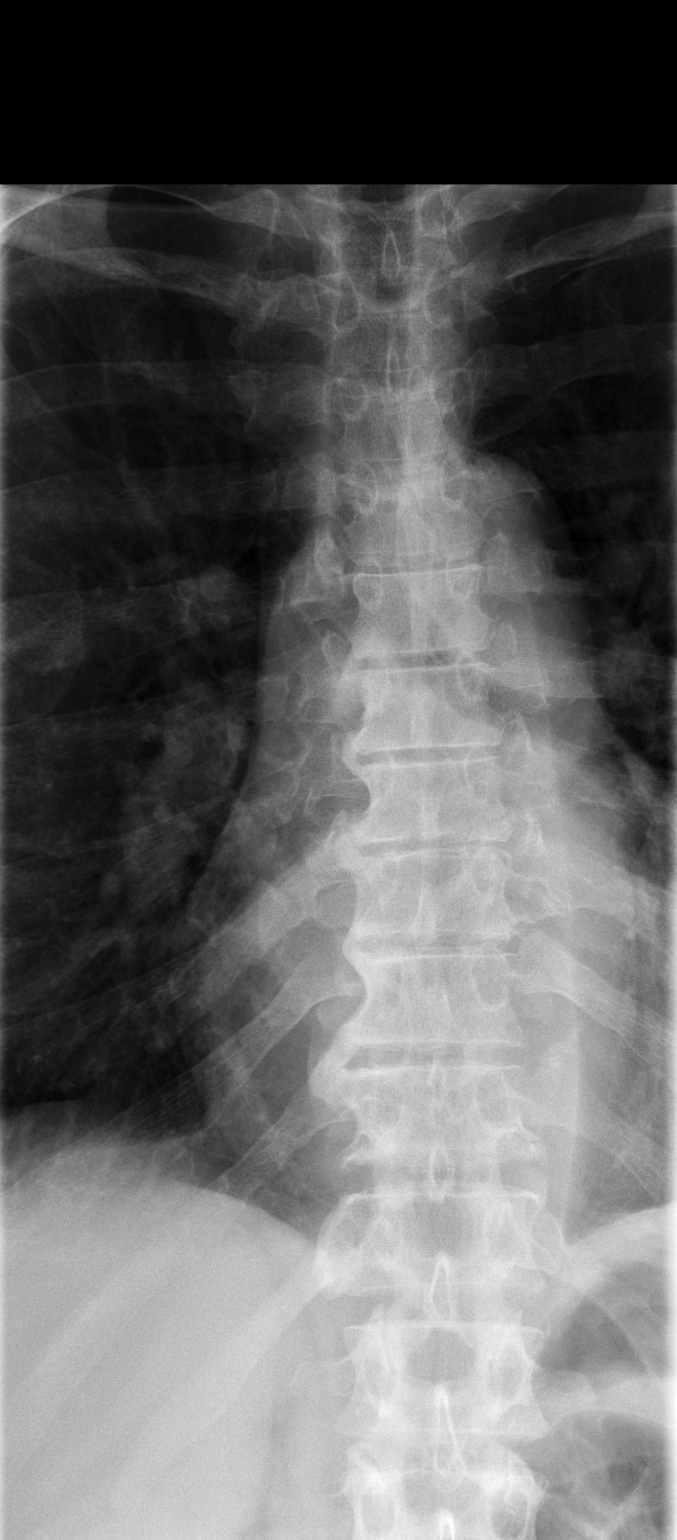

[w thoracic swimmers (1 of 2)]
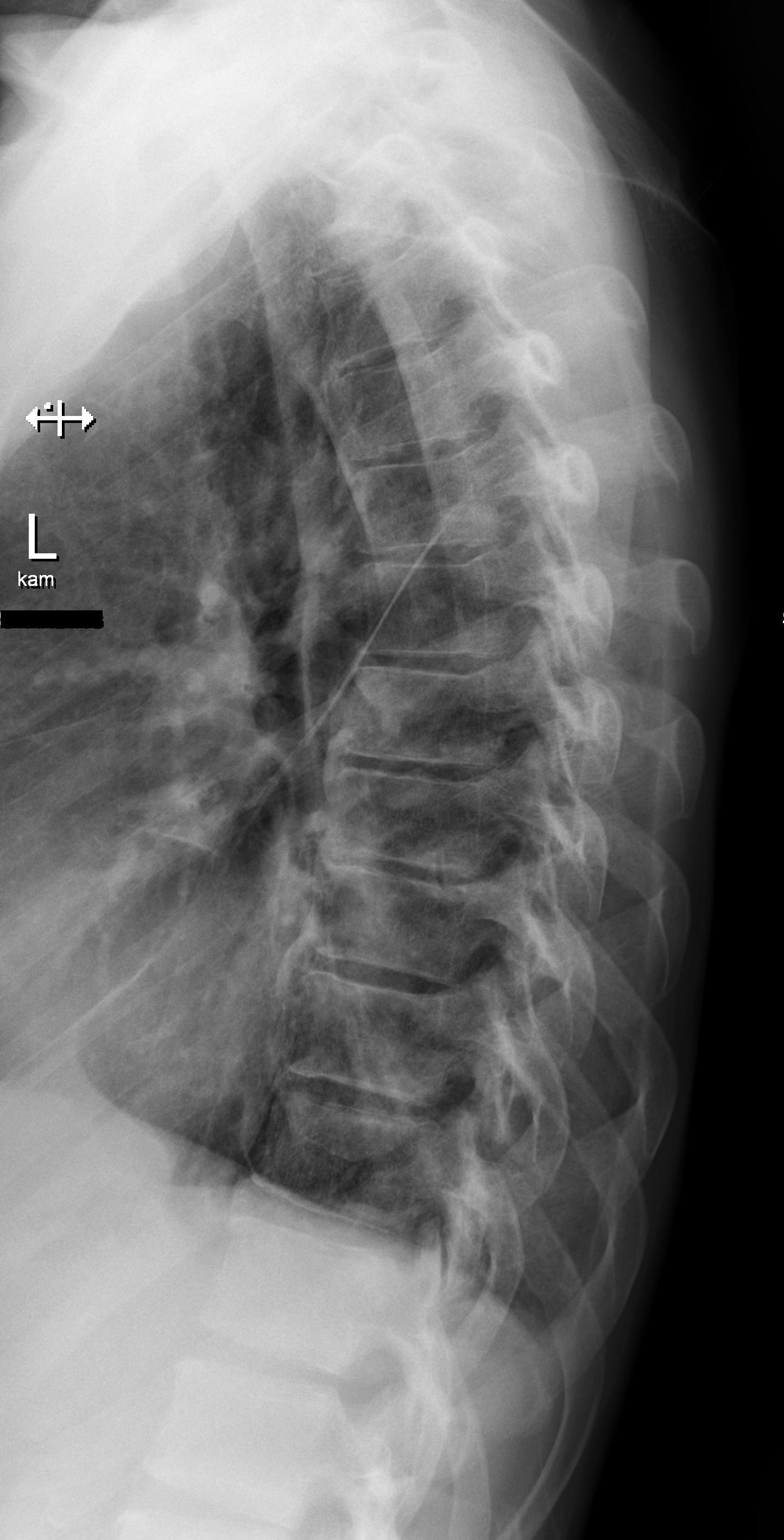

[w thoracic swimmers (2 of 2)]
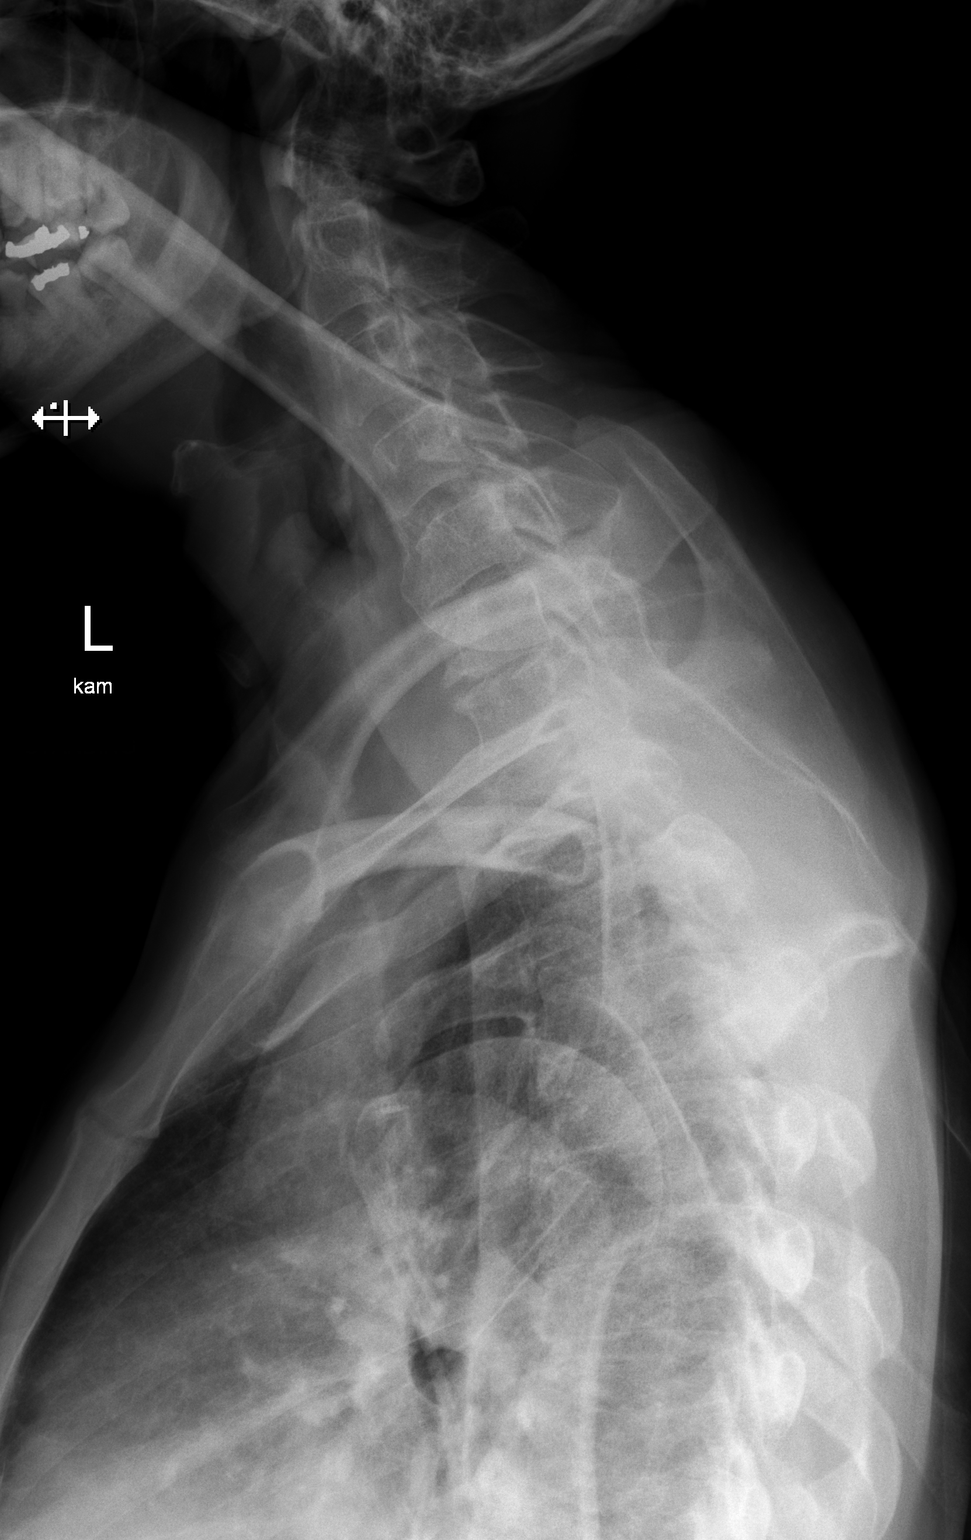

[3 of 3 positions shown; findings below may reference images not displayed]

FINDINGS: Degenerative spurring in the mid and lower thoracic spine. Normal
alignment. No fracture or focal bone lesion.
IMPRESSION: Degenerative disc disease.  No acute findings.

## 2019-10-07 ENCOUNTER — Ambulatory Visit: Payer: Medicare Other | Admitting: Cardiology

## 2019-10-10 ENCOUNTER — Other Ambulatory Visit: Payer: Self-pay | Admitting: Nephrology

## 2019-10-10 DIAGNOSIS — I712 Thoracic aortic aneurysm, without rupture, unspecified: Secondary | ICD-10-CM

## 2019-10-14 ENCOUNTER — Other Ambulatory Visit: Payer: Medicare Other

## 2019-10-17 ENCOUNTER — Inpatient Hospital Stay: Admission: RE | Admit: 2019-10-17 | Payer: Medicare Other | Source: Ambulatory Visit

## 2019-10-27 ENCOUNTER — Ambulatory Visit: Payer: Medicare Other | Admitting: Cardiology

## 2019-10-31 ENCOUNTER — Encounter: Payer: Self-pay | Admitting: Cardiology

## 2019-10-31 ENCOUNTER — Ambulatory Visit (INDEPENDENT_AMBULATORY_CARE_PROVIDER_SITE_OTHER): Payer: Medicare Other | Admitting: Cardiology

## 2019-10-31 ENCOUNTER — Other Ambulatory Visit: Payer: Self-pay

## 2019-10-31 VITALS — BP 114/82 | HR 64 | Temp 97.2°F | Ht 68.0 in | Wt 151.0 lb

## 2019-10-31 DIAGNOSIS — E782 Mixed hyperlipidemia: Secondary | ICD-10-CM | POA: Diagnosis not present

## 2019-10-31 DIAGNOSIS — I712 Thoracic aortic aneurysm, without rupture: Secondary | ICD-10-CM

## 2019-10-31 DIAGNOSIS — I10 Essential (primary) hypertension: Secondary | ICD-10-CM

## 2019-10-31 DIAGNOSIS — I7121 Aneurysm of the ascending aorta, without rupture: Secondary | ICD-10-CM

## 2019-10-31 DIAGNOSIS — I25118 Atherosclerotic heart disease of native coronary artery with other forms of angina pectoris: Secondary | ICD-10-CM | POA: Diagnosis not present

## 2019-10-31 DIAGNOSIS — N186 End stage renal disease: Secondary | ICD-10-CM

## 2019-10-31 DIAGNOSIS — Z992 Dependence on renal dialysis: Secondary | ICD-10-CM

## 2019-10-31 NOTE — Patient Instructions (Signed)
Medication Instructions:  Your physician recommends that you continue on your current medications as directed. Please refer to the Current Medication list given to you today.  *If you need a refill on your cardiac medications before your next appointment, please call your pharmacy*   Lab Work: None If you have labs (blood work) drawn today and your tests are completely normal, you will receive your results only by: [name] MyChart Message (if you have MyChart) OR . A paper copy in the mail If you have any lab test that is abnormal or we need to change your treatment, we will call you to review the results.   Testing/Procedures: None   Follow-Up: At [facility], you and your health needs are our priority.  As part of our continuing mission to provide you with exceptional heart care, we have created designated Provider Care Teams.  These Care Teams include your primary Cardiologist (physician) and Advanced Practice Providers (APPs -  Physician Assistants and Nurse Practitioners) who all work together to provide you with the care you need, when you need it.  We recommend signing up for the patient portal called "MyChart".  Sign up information is provided on this After Visit Summary.  MyChart is used to connect with patients for Virtual Visits (Telemedicine).  Patients are able to view lab/test results, encounter notes, upcoming appointments, etc.  Non-urgent messages can be sent to your provider as well.   To learn more about what you can do with MyChart, go to [email or web address].    Your next appointment:   3 month(s)  The format for your next appointment:   In Person  Provider:   [name], DO   Other Instructions Saunders Glance are being referred to a primary care doctor to establish care. They will contact you with a date and time

## 2019-10-31 NOTE — Progress Notes (Signed)
Cardiology Office Note:    Date:  10/31/2019   ID:  Thomas Robinson, DOB February 01, 1961, MRN [id number]  PCP:  Charlott Rakes, MD  Cardiologist:  [name], DO  Electrophysiologist:  None   Referring MD: Charlott Rakes, MD   Follow-up for coronary artery disease, proximal ascending aorta dilatation.  History of Present Illness:    Thomas Robinson a 59 y.o.malewith a hx of ESRD on hemodialysis(Tuesdays, Thursday, Saturday)hypertension, asthma, CVA, coronary artery disease, diastolic heart failure with recent ejection fraction reported as greater than 65%, anemia chronic disease, proximal ascending aorta dilatation measuring 4.5 cm in October 2020.  I last saw the patient July 15, 2019 at that time he appears to be doing well from a cardiovascular standpoint.  Therefore I continue his medication regimen.  In the interim he tells me he had 1 episode where he was at home in the started to cough and right after his coughing he felt as if he was going to pass out he sat down and this suddenly passed.  He had no chest pain, no shortness of breath no palpitations.  He reports that he has been missing days on his dialysis and now only goes Tuesdays and Fridays on his own accord as he tells me once he is a dialysis he feels significant cramping.  He has not seen a nephrologist in the outpatient setting because his previous nephrologist retired he only talked to people at the dialysis center.  He also has been experiencing trouble sleeping and is asking if there is something I can do to help him.  No other complaints at this time.  Past Medical History:  Diagnosis Date  . Acute kidney injury superimposed on CKD ([place]) 05/24/2017  . Acute on chronic diastolic CHF (congestive heart failure) ([place]) 04/13/2018  . AKI (acute kidney injury) ([place]) 04/10/2015  . Asthma   . Asthma, chronic, unspecified asthma severity, with acute exacerbation 10/23/2017  . Atypical chest pain 02/16/2018  . CAP  (community acquired pneumonia) 10/23/2017  . Cerebral infarction due to thrombosis of cerebral artery ([place])   . Cerebrovascular accident (CVA) ([place])   . Chronic kidney disease   . CKD (chronic kidney disease), stage V ([place]) 05/24/2017  . ESRD (end stage renal disease) on dialysis ([place]) 02/16/2018  . Essential hypertension 04/09/2015  . GERD (gastroesophageal reflux disease) 06/04/2019  . GI bleeding 05/24/2017  . History of completed stroke   . Hyperlipidemia   . Hypertension   . Hypertensive urgency 05/24/2017  . Kidney failure   . Respiratory failure, acute ([place]) 10/23/2017  . Restless leg syndrome   . Rib fractures 06/2019   Right  . Stroke ([place])   . Symptomatic anemia 05/24/2017    Past Surgical History:  Procedure Laterality Date  . AV FISTULA PLACEMENT Left 05/28/2017   Procedure: LEFT ARM ARTERIOVENOUS (AV) FISTULA CREATION;  Surgeon: Conrad Fredonia, MD;  Location: [facility];  Service: Vascular;  Laterality: Left;  . EYE SURGERY Right 06/02/2019   Cataract removed  . FRACTURE SURGERY    . IR FLUORO GUIDE CV LINE RIGHT  05/25/2017  . IR US GUIDE VASC ACCESS RIGHT  05/25/2017  . REVISON OF ARTERIOVENOUS FISTULA Left 07/18/2019   Procedure: REVISION PLICATION OF RADIOCEPHALIC ARTERIOVENOUS FISTULA LEFT ARM;  Surgeon: Angelia Mould, MD;  Location: [place];  Service: Vascular;  Laterality: Left;  . RINOPLASTY      Current Medications: Current Meds  Medication Sig  . acetaminophen (TYLENOL) 500 MG tablet Take  1,000 mg by mouth every 6 (six) hours as needed for moderate pain.  [name] albuterol (PROVENTIL HFA;VENTOLIN HFA) 108 (90 BASE) MCG/ACT inhaler Inhale 1-2 puffs into the lungs every 6 (six) hours as needed for wheezing or shortness of breath.  [name] amLODipine (NORVASC) 10 MG tablet Take 1 tablet (10 mg total) by mouth daily.  [name] aspirin EC 81 MG EC tablet Take 1 tablet (81 mg total) by mouth daily.  . budesonide-formoterol (SYMBICORT) 80-4.5 MCG/ACT inhaler Inhale 2 puffs into the lungs  2 (two) times daily.  . carvedilol (COREG) 12.5 MG tablet Take 1 tablet (12.5 mg total) by mouth 2 (two) times daily with a meal.  . cinacalcet (SENSIPAR) 30 MG tablet Take 30 mg by mouth daily with supper. Do not take less than 12 hours prior to dialysis  . cinacalcet (SENSIPAR) 60 MG tablet Take 60 mg by mouth at bedtime.  . gabapentin (NEURONTIN) 300 MG capsule Take 300 mg by mouth at bedtime.  . hydrALAZINE (APRESOLINE) 100 MG tablet Take 1 tablet (100 mg total) by mouth every 8 (eight) hours.  . metoprolol tartrate (LOPRESSOR) 100 MG tablet metoprolol tartrate 100 mg tablet  TAKE 1 TABLET BY MOUTH TWICE A DAY WITH FOOD  . multivitamin (RENA-VIT) TABS tablet Take 1 tablet by mouth daily.  . mupirocin ointment (BACTROBAN) 2 % mupirocin 2 % topical ointment  APPLY TO AFFECTED AREAS UP TO 3 TIMES A DAY AS NEEDED  . oxyCODONE (ROXICODONE) 5 MG immediate release tablet Take 1 tablet (5 mg total) by mouth every 4 (four) hours as needed.  . pantoprazole (PROTONIX) 40 MG tablet Take 40 mg by mouth daily as needed (acid reflux).   . sevelamer (RENAGEL) 800 MG tablet Take 800-1,600 mg by mouth See admin instructions. 1,600mg  by mouth three times daily and 800mg  twice daily with snacks  . tacrolimus (PROTOPIC) 0.1 % ointment tacrolimus 0.1 % topical ointment  . traMADol-acetaminophen (ULTRACET) 37.5-325 MG tablet tramadol 37.5 mg-acetaminophen 325 mg tablet  TAKE 1 TABLET BY MOUTH EVERY 6 HOURS AS NEEDED FOR PAIN     Allergies:   Patient has no known allergies.   Social History   Socioeconomic History  . Marital status: Single    Spouse name: Not on file  . Number of children: Not on file  . Years of education: Not on file  . Highest education level: Not on file  Occupational History  . Not on file  Tobacco Use  . Smoking status: Never Smoker  . Smokeless tobacco: Never Used  Substance and Sexual Activity  . Alcohol use: Not Currently    Alcohol/week: 0.0 standard drinks    Comment: has  a drink once in a while- 07/2018  . Drug use: No  . Sexual activity: Not on file  Other Topics Concern  . Not on file  Social History Narrative   Lives with a roommate in a one story home.  Has 1 child.  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  Education: high school.   Social Determinants of Health   Financial Resource Strain:   . Difficulty of Paying Living Expenses: Not on file  Food Insecurity:   . Worried About Charity fundraiser in the Last Year: Not on file  . Ran Out of Food in the Last Year: Not on file  Transportation Needs:   . Lack of Transportation (Medical): Not on file  . Lack of Transportation (Non-Medical): Not on file  Physical Activity:   .  Days of Exercise per Week: Not on file  . Minutes of Exercise per Session: Not on file  Stress:   . Feeling of Stress : Not on file  Social Connections:   . Frequency of Communication with Friends and Family: Not on file  . Frequency of Social Gatherings with Friends and Family: Not on file  . Attends Religious Services: Not on file  . Active Member of Clubs or Organizations: Not on file  . Attends Archivist Meetings: Not on file  . Marital Status: Not on file     Family History: The patient's family history includes Cancer in his mother.  ROS:   Review of Systems  Constitution: Negative for decreased appetite, fever and weight gain.  HENT: Negative for congestion, ear discharge, hoarse voice and sore throat.   Eyes: Negative for discharge, redness, vision loss in right eye and visual halos.  Cardiovascular: Negative for chest pain, dyspnea on exertion, leg swelling, orthopnea and palpitations.  Respiratory: Negative for cough, hemoptysis, shortness of breath and snoring.   Endocrine: Negative for heat intolerance and polyphagia.  Hematologic/Lymphatic: Negative for bleeding problem. Does not bruise/bleed easily.  Skin: Negative for flushing, nail changes, rash and suspicious lesions.  Musculoskeletal:  Negative for arthritis, joint pain, muscle cramps, myalgias, neck pain and stiffness.  Gastrointestinal: Negative for abdominal pain, bowel incontinence, diarrhea and excessive appetite.  Genitourinary: Negative for decreased libido, genital sores and incomplete emptying.  Neurological: Negative for brief paralysis, focal weakness, headaches and loss of balance.  Psychiatric/Behavioral: Negative for altered mental status, depression and suicidal ideas.  Allergic/Immunologic: Negative for HIV exposure and persistent infections.    EKGs/Labs/Other Studies Reviewed:    The following studies were reviewed today:   EKG: None today  CTA chest abdomen IMPRESSION: June 04, 2019 1. Acute posterior right eighth and ninth rib fractures. No pneumothorax. 2. Patchy consolidation in the peripheral right lower lobe, compatible with pulmonary contusion. 3. Mild cardiomegaly.  Two-vessel coronary atherosclerosis. 4. Mild diffuse interlobular septal thickening, suggesting mild pulmonary edema. 5. Mosaic attenuation throughout both lungs, a nonspecific finding that can be due to air trapping from small airways disease or mosaic perfusion from pulmonary vascular disease. 6. Ascending thoracic aortic 4.5 cm aneurysm. Ascending thoracic aortic aneurysm. Recommend semi-annual imaging followup by CTA or MRA and referral to cardiothoracic surgery if not already obtained. This recommendation follows 2010 ACCF/AHA/AATS/ACR/ASA/SCA/SCAI/SIR/STS/SVM Guidelines for the Diagnosis and Management of Patients With Thoracic Aortic Disease. Circulation. 2010; 121: F026-V785. Aortic aneurysm NOS (ICD10-I71.9). 7. No evidence of acute traumatic injury in the abdomen or pelvis on this noncontrast scan.  May 17, 2019 IMPRESSIONS  1. The left ventricle has hyperdynamic systolic function, with an  ejection fraction of >65%. The cavity size was normal. There is severely  increased left ventricular wall  thickness. Left ventricular diastolic  Doppler parameters are consistent with  impaired relaxation. Elevated left atrial and left ventricular  end-diastolic pressures The E/e' is >15. No evidence of left ventricular  regional wall motion abnormalities.  2. The right ventricle has normal systolic function. The cavity was  normal. There is no increase in right ventricular wall thickness.  3. Left atrial size was severely dilated.  4. The pericardial effusion is anterior to the right ventricle.  5. Trivial pericardial effusion is present.  6. The mitral valve is abnormal. Mild thickening of the mitral valve  leaflet.  7. The tricuspid valve is grossly normal.  8. The aortic valve is tricuspid. Mild sclerosis of the aortic  valve.  Aortic valve regurgitation is moderate by color flow Doppler.  9. The aorta is normal unless otherwise noted.  10. The inferior vena cava was dilated in size with >50% respiratory  variability.   Recent Labs: 05/16/2019: B Natriuretic Peptide >4,500.0 06/04/2019: Platelets 149 07/18/2019: BUN 44; Creatinine, Ser 8.90; Hemoglobin 12.6; Potassium 4.3; Sodium 136  Recent Lipid Panel    Component Value Date/Time   CHOL 133 06/10/2019 0944   TRIG 59 06/10/2019 0944   HDL 46 06/10/2019 0944   CHOLHDL 2.9 06/10/2019 0944   CHOLHDL 2.8 02/17/2018 0345   VLDL 14 02/17/2018 0345   LDLCALC 74 06/10/2019 0944    Physical Exam:    VS:  BP 114/82   Pulse 64   Temp (!) 97.2 F (36.2 C)   Ht 5\' 8"  (1.727 m)   Wt 151 lb (68.5 kg)   SpO2 97%   BMI 22.96 kg/m     Wt Readings from Last 3 Encounters:  10/31/19 151 lb (68.5 kg)  07/18/19 145 lb (65.8 kg)  07/15/19 141 lb (64 kg)     GEN: Well nourished, well developed in no acute distress HEENT: Normal NECK: No JVD; No carotid bruits LYMPHATICS: No lymphadenopathy CARDIAC: S1S2 noted,RRR, no murmurs, rubs, gallops RESPIRATORY:  Clear to auscultation without rales, wheezing or rhonchi  ABDOMEN: Soft,  non-tender, non-distended, +bowel sounds, no guarding. EXTREMITIES: No edema, No cyanosis, no clubbing MUSCULOSKELETAL:  No deformity  SKIN: Warm and dry NEUROLOGIC:  Alert and oriented x 3, non-focal PSYCHIATRIC:  Normal affect, good insight  ASSESSMENT:    1. Coronary artery disease of native artery of native heart with stable angina pectoris ([place])   2. Essential hypertension   3. Mixed hyperlipidemia   4. Thoracic ascending aortic aneurysm ([place])   5. ESRD on dialysis [facility])    PLAN:    From a cardiovascular standpoint he is stable.  He denies any chest pain, shortness of breath, palpitations.  No equivalent for angina as well.  We will continue him on his aspirin 81 mg daily along with his atorvastatin 20 mg a day and Imdur 30 mg daily.  He does have known proximal ascending aorta dilatation last admission October 2020 which was 4.5 cm.  Plan to repeat his study and appropriate timing which will be 12 months of his last CT scan.  I also educated patient on signs and symptoms of possible dissection and if he experience any chest pain he was educated about to go directly to the ED to be evaluated.  Hypertension, blood pressures are stable continue patient his current medication regimen.  Hyperlipidemia continue patient on current statin regimen.  ESRD-I encouraged the patient to continue to go to hemodialysis as advised by his nephrology team.  He does not have any PCP that he currently follows with can refer him to our primary care office.  The patient is in agreement with the above plan. The patient left the office in stable condition.  The patient will follow up in 3 months or sooner if needed.   Medication Adjustments/Labs and Tests Ordered: Current medicines are reviewed at length with the patient today.  Concerns regarding medicines are outlined above.  Orders Placed This Encounter  Procedures  . Ambulatory referral to Internal Medicine   No orders of the defined types  were placed in this encounter.   Patient Instructions  Medication Instructions:  Your physician recommends that you continue on your current medications as directed. Please refer to the Current  Medication list given to you today.  *If you need a refill on your cardiac medications before your next appointment, please call your pharmacy*   Lab Work: None If you have labs (blood work) drawn today and your tests are completely normal, you will receive your results only by: [name] MyChart Message (if you have MyChart) OR . A paper copy in the mail If you have any lab test that is abnormal or we need to change your treatment, we will call you to review the results.   Testing/Procedures: None   Follow-Up: At [facility], you and your health needs are our priority.  As part of our continuing mission to provide you with exceptional heart care, we have created designated Provider Care Teams.  These Care Teams include your primary Cardiologist (physician) and Advanced Practice Providers (APPs -  Physician Assistants and Nurse Practitioners) who all work together to provide you with the care you need, when you need it.  We recommend signing up for the patient portal called "MyChart".  Sign up information is provided on this After Visit Summary.  MyChart is used to connect with patients for Virtual Visits (Telemedicine).  Patients are able to view lab/test results, encounter notes, upcoming appointments, etc.  Non-urgent messages can be sent to your provider as well.   To learn more about what you can do with MyChart, go to [email or web address].    Your next appointment:   3 month(s)  The format for your next appointment:   In Person  Provider:   [name], DO   Other Instructions Saunders Glance are being referred to a primary care doctor to establish care. They will contact you with a date and time      Adopting a Healthy Lifestyle.  Know what a healthy weight is for you (roughly BMI <25)  and aim to maintain this   Aim for 7+ servings of fruits and vegetables daily   65-80+ fluid ounces of water or unsweet tea for healthy kidneys   Limit to max 1 drink of alcohol per day; avoid smoking/tobacco   Limit animal fats in diet for cholesterol and heart health - choose grass fed whenever available   Avoid highly processed foods, and foods high in saturated/trans fats   Aim for low stress - take time to unwind and care for your mental health   Aim for 150 min of moderate intensity exercise weekly for heart health, and weights twice weekly for bone health   Aim for 7-9 hours of sleep daily   When it comes to diets, agreement about the perfect plan isnt easy to find, even among the experts. Experts at the [employer] developed an idea known as the Healthy Eating Plate. Just imagine a plate divided into logical, healthy portions.   The emphasis is on diet quality:   Load up on vegetables and fruits - one-half of your plate: Aim for color and variety, and remember that potatoes dont count.   Go for whole grains - one-quarter of your plate: Whole wheat, barley, wheat berries, quinoa, oats, brown rice, and foods made with them. If you want pasta, go with whole wheat pasta.   Protein power - one-quarter of your plate: Fish, chicken, beans, and nuts are all healthy, versatile protein sources. Limit red meat.   The diet, however, does go beyond the plate, offering a few other suggestions.   Use healthy plant oils, such as olive, canola, soy, corn, sunflower and peanut. Check the labels,  and avoid partially hydrogenated oil, which have unhealthy trans fats.   If youre thirsty, drink water. Coffee and tea are good in moderation, but skip sugary drinks and limit milk and dairy products to one or two daily servings.   The type of carbohydrate in the diet is more important than the amount. Some sources of carbohydrates, such as vegetables, fruits, whole grains, and  beans-are healthier than others.   Finally, stay active  Signed, [name], DO  10/31/2019 3:53 PM    [facility]

## 2019-11-11 DIAGNOSIS — M25512 Pain in left shoulder: Secondary | ICD-10-CM

## 2019-11-11 HISTORY — DX: Pain in left shoulder: M25.512

## 2019-11-24 DIAGNOSIS — M5416 Radiculopathy, lumbar region: Secondary | ICD-10-CM | POA: Insufficient documentation

## 2019-11-24 DIAGNOSIS — M5136 Other intervertebral disc degeneration, lumbar region: Secondary | ICD-10-CM | POA: Insufficient documentation

## 2019-11-24 DIAGNOSIS — G8929 Other chronic pain: Secondary | ICD-10-CM | POA: Insufficient documentation

## 2019-11-24 DIAGNOSIS — M545 Low back pain, unspecified: Secondary | ICD-10-CM

## 2019-11-24 HISTORY — DX: Low back pain, unspecified: M54.50

## 2019-11-24 HISTORY — DX: Other chronic pain: G89.29

## 2019-11-24 HISTORY — DX: Radiculopathy, lumbar region: M54.16

## 2019-12-15 ENCOUNTER — Ambulatory Visit: Payer: Medicare Other

## 2019-12-19 ENCOUNTER — Ambulatory Visit: Payer: Self-pay | Admitting: Orthopedic Surgery

## 2019-12-26 ENCOUNTER — Ambulatory Visit: Payer: Self-pay | Admitting: Orthopedic Surgery

## 2019-12-26 ENCOUNTER — Telehealth: Payer: Self-pay | Admitting: Cardiology

## 2019-12-26 NOTE — H&P (Signed)
Subjective:   Thomas Robinson is a 59 y.o. male with a hx of ESRD on hemodialysis(Tuesdays, Thursday, Saturday) hypertension, asthma, CVA, coronary artery disease, diastolic heart failure with recent ejection fraction reported as greater than 65%, anemia chronic disease, proximal ascending aorta dilatation measuring 4.5 cm in October 2020 He has had 3 years of progressive neck pain and radicular Right arm pain. Trial of injection therapy was recommended. Since seeing Dr. Rolena Infante on 12/12/2019 the patient follow-up Dr. Maxie Better 2 more times And in 1 of Dr. Bernadette Hoit notes, Dr. Maxie Better felt as though the patient was myeloopathic and therefore the patient was referred back to [place] for surgical intervention. Today, he denies any significant radicular arm pain, although he does note that his balance and gait have worsened and he has had more issues with dropping things. He has not had a cervical epidural steroid injection. He is scheduled for ACDF C5-7 on 01/04/20 With Dr. Rolena Infante at [facility].  Patient Active Problem List   Diagnosis Date Noted  . Rib fractures 06/05/2019  . Fall at home, initial encounter 06/04/2019  . Right rib fracture 06/04/2019  . Lung contusion 06/04/2019  . Acute respiratory failure with hypoxia ([place]) 06/04/2019  . Anemia in ESRD (end-stage renal disease) ([place]) 06/04/2019  . GERD (gastroesophageal reflux disease) 06/04/2019  . Chronic diastolic (congestive) heart failure ([place]) 06/04/2019  . Fall 06/04/2019  . Thoracic ascending aortic aneurysm ([place]) 06/04/2019  . Acute exacerbation of congestive heart failure ([place]) 05/16/2019  . Chest pain 04/13/2018  . Acute on chronic diastolic CHF (congestive heart failure) ([place]) 04/13/2018  . Chest pressure   . Atypical chest pain 02/16/2018  . ESRD on dialysis ([place]) 02/16/2018  . Volume overload 10/23/2017  . Cough 08/23/2017  . Constipation   . Macrocytic anemia 05/24/2017  . Hypertensive urgency 05/24/2017  . History of completed  stroke   . Essential hypertension 04/09/2015   Past Medical History:  Diagnosis Date  . Acute kidney injury superimposed on CKD ([place]) 05/24/2017  . Acute on chronic diastolic CHF (congestive heart failure) ([place]) 04/13/2018  . AKI (acute kidney injury) ([place]) 04/10/2015  . Asthma   . Asthma, chronic, unspecified asthma severity, with acute exacerbation 10/23/2017  . Atypical chest pain 02/16/2018  . CAP (community acquired pneumonia) 10/23/2017  . Cerebral infarction due to thrombosis of cerebral artery ([place])   . Cerebrovascular accident (CVA) ([place])   . Chronic kidney disease   . CKD (chronic kidney disease), stage V ([place]) 05/24/2017  . ESRD (end stage renal disease) on dialysis ([place]) 02/16/2018  . Essential hypertension 04/09/2015  . GERD (gastroesophageal reflux disease) 06/04/2019  . GI bleeding 05/24/2017  . History of completed stroke   . Hyperlipidemia   . Hypertension   . Hypertensive urgency 05/24/2017  . Kidney failure   . Respiratory failure, acute ([place]) 10/23/2017  . Restless leg syndrome   . Rib fractures 06/2019   Right  . Stroke ([place])   . Symptomatic anemia 05/24/2017    Past Surgical History:  Procedure Laterality Date  . AV FISTULA PLACEMENT Left 05/28/2017   Procedure: LEFT ARM ARTERIOVENOUS (AV) FISTULA CREATION;  Surgeon: Conrad McClelland, MD;  Location: [facility];  Service: Vascular;  Laterality: Left;  . EYE SURGERY Right 06/02/2019   Cataract removed  . FRACTURE SURGERY    . IR FLUORO GUIDE CV LINE RIGHT  05/25/2017  . IR US GUIDE VASC ACCESS RIGHT  05/25/2017  . REVISON OF ARTERIOVENOUS FISTULA Left 07/18/2019  Procedure: REVISION PLICATION OF RADIOCEPHALIC ARTERIOVENOUS FISTULA LEFT ARM;  Surgeon: Angelia Mould, MD;  Location: [facility];  Service: Vascular;  Laterality: Left;  . RINOPLASTY      Current Outpatient Medications  Medication Sig Dispense Refill Last Dose  . albuterol (PROVENTIL HFA;VENTOLIN HFA) 108 (90 BASE) MCG/ACT inhaler Inhale 1-2 puffs into the lungs  every 6 (six) hours as needed for wheezing or shortness of breath. (Patient not taking: Reported on 12/21/2019) 1 Inhaler 0   . amLODipine (NORVASC) 10 MG tablet Take 1 tablet (10 mg total) by mouth daily. 30 tablet 0   . aspirin EC 81 MG EC tablet Take 1 tablet (81 mg total) by mouth daily. 30 tablet 0   . atorvastatin (LIPITOR) 20 MG tablet Take 1 tablet (20 mg total) by mouth daily. 90 tablet 1   . budesonide-formoterol (SYMBICORT) 80-4.5 MCG/ACT inhaler Inhale 2 puffs into the lungs 2 (two) times daily. (Patient not taking: Reported on 12/21/2019) 1 Inhaler 1   . carvedilol (COREG) 12.5 MG tablet Take 1 tablet (12.5 mg total) by mouth 2 (two) times daily with a meal. (Patient not taking: Reported on 12/21/2019) 60 tablet 0   . hydrALAZINE (APRESOLINE) 100 MG tablet Take 1 tablet (100 mg total) by mouth every 8 (eight) hours. (Patient taking differently: Take 100 mg by mouth 2 (two) times daily. ) 90 tablet 0   . isosorbide mononitrate (IMDUR) 30 MG 24 hr tablet Take 1 tablet (30 mg total) by mouth daily. 90 tablet 1   . metoprolol tartrate (LOPRESSOR) 100 MG tablet Take 100 mg by mouth 2 (two) times daily.      [name] oxyCODONE (ROXICODONE) 5 MG immediate release tablet Take 1 tablet (5 mg total) by mouth every 4 (four) hours as needed. (Patient not taking: Reported on 12/21/2019) 12 tablet 0   . oxyCODONE-acetaminophen (PERCOCET/ROXICET) 5-325 MG tablet Take 1 tablet by mouth 2 (two) times daily as needed for pain.     . sevelamer (RENAGEL) 800 MG tablet Take 2,400 mg by mouth 3 (three) times daily with meals.   6    No current facility-administered medications for this visit.   No Known Allergies  Social History   Tobacco Use  . Smoking status: Never Smoker  . Smokeless tobacco: Never Used  Substance Use Topics  . Alcohol use: Not Currently    Alcohol/week: 0.0 standard drinks    Comment: has a drink once in a while- 07/2018    Family History  Problem Relation Age of Onset  . Cancer Mother      Review of Systems As stated in HPI  Objective:   Vitals: Ht: 5 ft 6 in 12/26/2019 10:29 am Wt: 145 lbs 12/26/2019 10:29 am BMI: 23.4 12/26/2019 10:29 am BP: 138/70 12/26/2019 10:32 am Pulse: 75 bpm 12/26/2019 10:33 am T: 97.9 F 12/26/2019 10:33 am  General: AAOX3, well developed and well nourished, NAD  Ambulation: Slightly ataxic gait pattern, uses no assistive device.  Inspection: No obvious deformity, scoleosis, kyphosis, loss of lordotic curve. Dialysis fistula in left upper extremity.  Heart: Regular rate and rhythm, no rubs, murmurs, or gallops  Lungs: Clear to auscultation bilaterally  Abdomen: Bowel sounds 4, nondistended, nontender, no hepatosplenomegaly, no loss of bladder or bowel control.  Neuro: 5/5 motor strength in the right upper extremity. 4+/5 left bicep and tricep strength. 3/5 wrist extension, finger abduction and grip strength on the left, Positive Hoffman test on the left, positive Babinski test on the left. Today,  he denies any numbness and dysesthesias into the left upper extremity predominately in the C6 and C7 dermatome.  Musculoskeletal: Moderate to significant neck pain with palpation and range of motion. Pain radiates into the left trapezius and into the left upper extremity. Patient has no significant shoulder, elbow, wrist pain with isolated joint range of motion.  Cervical x-rays completed on 12/03/18: Demonstrate degenerative cervical disease C5-6 C6-7. No abnormal soft tissue swelling noted. Slight loss of normal cervical lordosis.  Cervical MRI: completed on 12/08/19 was reviewed with the patient. [facility] was completed at [facility]; I have independently reviewed the images as well as the radiology report. To severe left and moderate right foraminal narrowing at C2-3. Moderate central stenosis with severe right neuroforaminal narrowing at C3-4. Slight anterior listhesis C4-5 with significant foraminal narrowing bilaterally. Moderate central  stenosis. C5-6: Severe hard disc osteophyte causing central stenosis with severe left neuroforaminal narrowing. C6-7 slight retrolisthesis noted. Severe foraminal stenosis left slightly worse than the right. There is also significant central canal stenosis. There is no cord signal changes or abnormal marrow signal changes. Overall impression: Severe stenosis at C6-7 with bilateral C7 nerve root irritation, moderate to severe stenosis C5-6 with severe left foraminal stenosis causing C6 nerve root irritation. Moderate to severe stenosis C4-5 with some impingement primarily on the right C5 nerve root. Moderate C3-4 stenosis with impingement predominantly on the right C4 nerve root. Also left C3 nerve root impingement at the C2-3 level.  New exam findings and balance symptoms discussed with Dr. Rolena Infante. He does not feel that these changes would change the surgical plan nor require a new MRI.  Assessment:   Thomas Robinson is a 59 y.o. male with a hx of ESRD on hemodialysis(Tuesdays, Thursday, Saturday) hypertension, asthma, CVA, coronary artery disease, diastolic heart failure with recent ejection fraction reported as greater than 65%, anemia chronic disease, proximal ascending aorta dilatation measuring 4.5 cm in October 2020 He has had 3 years of progressive neck pain and radicular Right arm pain. There are new concerning exam findings today including slightly ataxic gait, weakness of left wrist extensors and grip strength, positive left hoffmans and babinski; however MRI was performed recently (12/08/19) and this does not show myelopathy. I spoke to Dr. Rolena Infante who does not feel that these exam findings would not change the surgical plan and therefore he does not think an updated MRI is warranted. We will continue with out plan to move forward with a 2 level (C5-7) ACDF.  Plan:   C5-7 ACDF on 01/04/20 at [facility] with Dr. Rolena Infante pending medical clearance from cardiologist and nephrologist.   Risks  and benefits of surgery were discussed with the patient. These include: Infection, bleeding, death, stroke, paralysis, ongoing or worse pain, need for additional surgery, nonunion, leak of spinal fluid, adjacent segment degeneration requiring additional fusion surgery. Pseudoarthrosis (nonunion)requiring supplemental posterior fixation. Throat pain, swallowing difficulties, hoarseness or change in voice.  With respect to disc replacement: Additional risks include heterotopic ossification, inability to place the disc due to technical issues requiring bailout to a fusion procedure.   Today, I did review the patient's medication list with him. He is on aspirin 81 mg daily, I did advise him to hold this 7 days prior to surgery and we will restart it 48 hours after surgery. He denies being on any blood thinners. He is not taking any over-the-counter anti-inflammatory medications and I advised him to avoid doing so leading up to surgery.   At this point,  we do not have any medical clearance for the patient. Initially, he told us he did not have a primary care provider and we elected to move forward with preoperative testing performed at the hospital. However, it has become apparent that the patient has end-stage renal disease (on dialysis) as well as chronic heart failure, coronary artery disease, and aortic dilation for which he sees a cardiologist. We have therefore personally contacted the patient's nephrologist and cardiologist to request preoperative medical clearance. If we are unable to get medical clearance from his nephrologist and cardiologist prior to his scheduled surgery, then we will haave to postpone.   We have also discussed the post-operative recovery period to include: bathing/showering restrictions, wound healing, activity (and driving) restrictions, medications/pain mangement.   We have also discussed post-operative redflags to include: signs and symptoms of postoperative infection,  DVT/PE.   Patient has an Designer, multimedia which was fitted by physical therapy.   All patients questions were invited and answered.   Follow-up: 2 weeks postoperatively.

## 2019-12-26 NOTE — Telephone Encounter (Signed)
New Message       [facility] Pre-operative Risk Assessment    Request for surgical clearance:  1. What type of surgery is being performed? ANTERIOR CERVICAL DECOMPRESSION/DISCECTOMY FUSION  2. When is this surgery scheduled? 01/04/20  3. What type of clearance is required (medical clearance vs. Pharmacy clearance to hold med vs. Both)?  Both   4. Are there any medications that need to be held prior to surgery and how long? Not known   5. Practice name and name of physician performing surgery? [facility]   6. What is your office phone number [phone]   7.   What is your office fax number [phone]  8.   Anesthesia type (None, local, MAC, general) ? General    Marca Ancona 12/26/2019, 11:28 AM  _________________________________________________________________   (provider comments below)

## 2019-12-27 ENCOUNTER — Ambulatory Visit: Payer: Self-pay | Admitting: Orthopedic Surgery

## 2019-12-27 DIAGNOSIS — Z01818 Encounter for other preprocedural examination: Secondary | ICD-10-CM

## 2019-12-27 NOTE — Telephone Encounter (Signed)
   Primary Cardiologist: [name], DO  Chart reviewed as part of pre-operative protocol coverage. Patient was contacted 12/27/2019 in reference to pre-operative risk assessment for pending surgery as outlined below.  Thomas Robinson was last seen on 10/31/19 by Dr. Harriet Masson 10/31/19.  Since that day, Thomas Robinson has done well. Had mechanical fall few weeks ago. Easily getting > 4 mets of activity. BP is well controlled. Recommended BP goal of less than 130/80 post operatively.   Therefore, based on ACC/AHA guidelines, the patient would be at acceptable risk for the planned procedure without further cardiovascular testing.   I will route this recommendation to the requesting party via Epic fax function and remove from pre-op pool.  Please call with questions.  [place], [place] 12/27/2019, 9:54 AM

## 2019-12-28 ENCOUNTER — Inpatient Hospital Stay (HOSPITAL_COMMUNITY)
Admission: RE | Admit: 2019-12-28 | Discharge: 2019-12-28 | Disposition: A | Discharge status: Home / Self Care | Payer: Medicare Other | Source: Ambulatory Visit

## 2019-12-28 ENCOUNTER — Encounter (HOSPITAL_COMMUNITY): Payer: Self-pay

## 2019-12-28 HISTORY — DX: Aneurysm of the ascending aorta, without rupture: I71.21

## 2019-12-28 HISTORY — DX: Thoracic aortic aneurysm, without rupture: I71.2

## 2019-12-28 NOTE — Pre-Procedure Instructions (Signed)
[id number] - [place], [place] [address] [place] [address] Phone: [phone] Fax: [phone]     Your procedure is scheduled on Wednesday, May 5th, from 08:30 AM- 12:30 PM.  Report to [facility] Entr[facility] "A" at 06:30 A.M., and check in at the Admitting office.  Call this number if you have problems the morning of surgery:  [phone]  Call [phone] if you have any questions prior to your surgery date Monday-Friday 8am-4pm.    Remember:  Do not eat or drink after midnight the night before your surgery.     Take these medicines the morning of surgery with A SIP OF WATER : amLODipine (NORVASC) atorvastatin (LIPITOR) hydrALAZINE (APRESOLINE) isosorbide mononitrate (IMDUR)  metoprolol tartrate (LOPRESSOR)   IF NEEDED: oxyCODONE-acetaminophen (PERCOCET/ROXICET)   Follow your surgeon's instructions on when to stop Aspirin.  If no instructions were given by your surgeon then you will need to call the office to get those instructions.    As of today, STOP taking any Aspirin containing products, Aleve, Naproxen, Ibuprofen, Motrin, Advil, Goody's, BC's, all herbal medications, fish oil, and all vitamins.                      Do not wear jewelry.            Do not wear lotions, powders, colognes, or deodorant.            Men may shave face and neck.            Do not bring valuables to the hospital.            [facility] is not responsible for any belongings or valuables.  Do NOT Smoke (Tobacco/Vapping) or drink Alcohol 24 hours prior to your procedure.  If you use a CPAP at night, you may bring all equipment for your overnight stay.   Contacts, glasses, dentures or bridgework may not be worn into surgery.      For patients admitted to the hospital, discharge time will be determined by your treatment team.   Patients discharged the day of surgery will not be allowed to drive home, and someone needs to stay with them for 24  hours.    Special instructions:   [facility]- Preparing For Surgery  Before surgery, you can play an important role. Because skin is not sterile, your skin needs to be as free of germs as possible. You can reduce the number of germs on your skin by washing with CHG (chlorahexidine gluconate) Soap before surgery.  CHG is an antiseptic cleaner which kills germs and bonds with the skin to continue killing germs even after washing.    Oral Hygiene is also important to reduce your risk of infection.  Remember - BRUSH YOUR TEETH THE MORNING OF SURGERY WITH YOUR REGULAR TOOTHPASTE  Please do not use if you have an allergy to CHG or antibacterial soaps. If your skin becomes reddened/irritated stop using the CHG.  Do not shave (including legs and underarms) for at least 48 hours prior to first CHG shower. It is OK to shave your face.  Please follow these instructions carefully.   1. Shower the NIGHT BEFORE SURGERY and the MORNING OF SURGERY with CHG Soap.   2. If you chose to wash your hair, wash your hair first as usual with your normal shampoo.  3. After you shampoo, rinse your hair and body thoroughly to remove the shampoo.  4. Use CHG  as you would any other liquid soap. You can apply CHG directly to the skin and wash gently with a scrungie or a clean washcloth.   5. Apply the CHG Soap to your body ONLY FROM THE NECK DOWN.  Do not use on open wounds or open sores. Avoid contact with your eyes, ears, mouth and genitals (private parts). Wash Face and genitals (private parts)  with your normal soap.   6. Wash thoroughly, paying special attention to the area where your surgery will be performed.  7. Thoroughly rinse your body with warm water from the neck down.  8. DO NOT shower/wash with your normal soap after using and rinsing off the CHG Soap.  9. Pat yourself dry with a CLEAN TOWEL.  10. Wear CLEAN PAJAMAS to bed the night before surgery, wear comfortable clothes the morning of  surgery  11. Place CLEAN SHEETS on your bed the night of your first shower and DO NOT SLEEP WITH PETS.   Day of Surgery:   Do not apply any deodorants/lotions.  Please wear clean clothes to the hospital/surgery center.   Remember to brush your teeth WITH YOUR REGULAR TOOTHPASTE.   Please read over the following fact sheets that you were given.

## 2019-12-28 NOTE — Progress Notes (Signed)
Anesthesia Note:   Case: [id number] Date/Time: 01/04/20 0815   Procedure: ANTERIOR CERVICAL DECOMPRESSION/DISCECTOMY FUSION C5-7 (N/A ) - 3 hrs   Anesthesia type: General   Pre-op diagnosis: Cervical spondylotic radiculopathy   Location: [facility] OR ROOM 04 / [place] [facility]   Surgeons: Melina Schools, MD      DISCUSSION: Patient is a 59 year old male scheduled for the above procedure. Dr. Rolena Infante had requested anesthesia evaluation due to medical history and currently in-between primary care providers; however, patient was a no show to 12/28/19 11:00 AM PAT visit. He does have cardiology preoperative input.  History includes never smoker, HTN, HLD, chronic diastolic CHF, atypical chest pain (2019) CVA, ESRD ([facility], [facility]), GI bleed (2018), anemia, asthma, GERD, ascending TAA (4.5 cm). Mild non-obstructive CAD on 06/14/18 coronary CT. Fall with right 8th and 9th rib fractures, pulmonary contusion 06/2019--required 3L O2 and he failed O2 evaluation but left AMA.  Preoperative cardiology input outlined on 12/26/19 by Leanor Kail, PA, "[name] was last seen on 10/31/19 by Dr. Harriet Masson 10/31/19.  Since that day, Faizon Capozzi has done well. Had mechanical fall few weeks ago. Easily getting > 4 mets of activity. BP is well controlled. Recommended BP goal of less than 130/80 post operatively.   Therefore, based on ACC/AHA guidelines, the patient would be at acceptable risk for the planned procedure without further cardiovascular testing."   Surgery is scheduled on a Wednesday which is a non-hemodialysis day. Presurgical COVID-19 test is scheduled for 01/02/20.    VS: There were no vitals taken for this visit. As of 10/31/19, BP 114/82, HR 64.  PROVIDERS: Charlott Rakes, MD is listed as PCP [name], DO is cardiologist. Last evaluation 10/31/19. Felt stable from a CV standpoint at that time. She discussed re-evaluation of TAA ~ 06/2020.  Nephrologist is with [facility]   LABS: Pending  PAT visit.    IMAGES: 1V CXR 06/05/2019 FINDINGS: Stable cardiomediastinal silhouette with top-normal heart size. No pneumothorax. No pleural effusion. Patchy opacity at the right lung base is slightly increased. Stable curvilinear left lung base opacities. No pulmonary edema. IMPRESSION: 1. No pneumothorax. 2. Patchy right lung base opacity, slightly increased, favor a combination of contusion and atelectasis. 3. Stable left lung base scarring versus atelectasis.  CT chest/abd/pelvis 06/04/19: IMPRESSION: 1. Acute posterior right eighth and ninth rib fractures. No pneumothorax. 2. Patchy consolidation in the peripheral right lower lobe, compatible with pulmonary contusion. 3. Mild cardiomegaly.  Two-vessel coronary atherosclerosis. 4. Mild diffuse interlobular septal thickening, suggesting mild pulmonary edema. 5. Mosaic attenuation throughout both lungs, a nonspecific finding that can be due to air trapping from small airways disease or mosaic perfusion from pulmonary vascular disease. 6. Ascending thoracic aortic 4.5 cm aneurysm. Ascending thoracic aortic aneurysm. Recommend semi-annual imaging followup by CTA or MRA and referral to cardiothoracic surgery if not already obtained. This recommendation follows 2010 ACCF/AHA/AATS/ACR/ASA/SCA/SCAI/SIR/STS/SVM Guidelines for the Diagnosis and Management of Patients With Thoracic Aortic Disease. Circulation. 2010; 121: V425-Z563. Aortic aneurysm NOS (ICD10-I71.9). 7. No evidence of acute traumatic injury in the abdomen or pelvis on this noncontrast scan.   EKG: 06/10/2019 ([facility]) Normal sinus rhythm Possible left atrial enlargement Left ventricular hypertrophy -Early repolarization similar to previous EKG on 06/06/2019   CV: TTE 05/17/2019  SUMMARY LVEF 65-70%, severe LVH (consider work-up for infiltrative cardiomyopathy such as amyloidosis), normal wall motion, grade 1 DD, elevated LV filling pressure, normal RV  function, severe LAE, aortic valve sclerosis with moderate AI, trivial MR, mild  TR, RVSP 54 mmHg, dilated IVC that collapses, trivial pericardial effusion  Coronary CT 06/24/18  IMPRESSION: 1. Coronary calcium score of 39. This was 8 percentile for age and sex matched control. 2. Normal coronary origin with right dominance. 3. Mild non-obstructive CAD. 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 4. Aneurysmal dilatation of the ascending aorta with maximum diameter 43 mm, no calcifications and no dissection. 5. Dilated pulmonary artery measuring 35 mm suggestive of pulmonary Hypertension.  Carotid US 04/10/2015 Summary:  Mild intimal thickening with no significant extracranial carotid  artery stenosis demonstrated. Vertebrals are patent with antegrade  flow.    Past Medical History:  Diagnosis Date  . Acute kidney injury superimposed on CKD ([place]) 05/24/2017  . Acute on chronic diastolic CHF (congestive heart failure) ([place]) 04/13/2018  . AKI (acute kidney injury) ([place]) 04/10/2015  . Asthma   . Asthma, chronic, unspecified asthma severity, with acute exacerbation 10/23/2017  . Atypical chest pain 02/16/2018  . CAP (community acquired pneumonia) 10/23/2017  . Cerebral infarction due to thrombosis of cerebral artery ([place])   . Cerebrovascular accident (CVA) ([place])   . Chronic kidney disease   . CKD (chronic kidney disease), stage V ([place]) 05/24/2017  . ESRD (end stage renal disease) on dialysis ([place]) 02/16/2018  . Essential hypertension 04/09/2015  . GERD (gastroesophageal reflux disease) 06/04/2019  . GI bleeding 05/24/2017  . History of completed stroke   . Hyperlipidemia   . Hypertension   . Hypertensive urgency 05/24/2017  . Kidney failure   . Respiratory failure, acute ([place]) 10/23/2017  . Restless leg syndrome   . Rib fractures 06/2019   Right  . Stroke ([place])   . Symptomatic anemia 05/24/2017  . Thoracic ascending aortic aneurysm ([facility])    4.5 cm 06/04/19 CT     Past Surgical History:  Procedure Laterality Date  . AV FISTULA PLACEMENT Left 05/28/2017   Procedure: LEFT ARM ARTERIOVENOUS (AV) FISTULA CREATION;  Surgeon: Conrad Bootjack, MD;  Location: [facility];  Service: Vascular;  Laterality: Left;  . EYE SURGERY Right 06/02/2019   Cataract removed  . FRACTURE SURGERY    . IR FLUORO GUIDE CV LINE RIGHT  05/25/2017  . IR US GUIDE VASC ACCESS RIGHT  05/25/2017  . REVISON OF ARTERIOVENOUS FISTULA Left 07/18/2019   Procedure: REVISION PLICATION OF RADIOCEPHALIC ARTERIOVENOUS FISTULA LEFT ARM;  Surgeon: Angelia Mould, MD;  Location: [facility];  Service: Vascular;  Laterality: Left;  . RINOPLASTY      MEDICATIONS: . albuterol (PROVENTIL HFA;VENTOLIN HFA) 108 (90 BASE) MCG/ACT inhaler  . amLODipine (NORVASC) 10 MG tablet  . aspirin EC 81 MG EC tablet  . atorvastatin (LIPITOR) 20 MG tablet  . budesonide-formoterol (SYMBICORT) 80-4.5 MCG/ACT inhaler  . carvedilol (COREG) 12.5 MG tablet  . hydrALAZINE (APRESOLINE) 100 MG tablet  . isosorbide mononitrate (IMDUR) 30 MG 24 hr tablet  . metoprolol tartrate (LOPRESSOR) 100 MG tablet  . oxyCODONE (ROXICODONE) 5 MG immediate release tablet  . oxyCODONE-acetaminophen (PERCOCET/ROXICET) 5-325 MG tablet  . sevelamer (RENAGEL) 800 MG tablet   No current facility-administered medications for this encounter.    Myra Gianotti, PA-C Surgical Short Stay/Anesthesiology [facility] Phone [phone] [facility] Phone [phone] 12/28/2019 11:58 AM

## 2019-12-28 NOTE — Progress Notes (Signed)
Patient has not shown up for PAT appointment. Called, left VM.

## 2019-12-29 ENCOUNTER — Ambulatory Visit (INDEPENDENT_AMBULATORY_CARE_PROVIDER_SITE_OTHER): Payer: Medicare Other | Admitting: Physician Assistant

## 2019-12-29 ENCOUNTER — Other Ambulatory Visit: Payer: Self-pay

## 2019-12-29 VITALS — BP 164/80 | HR 66 | Temp 97.2°F | Resp 16 | Ht 68.0 in | Wt 137.0 lb

## 2019-12-29 DIAGNOSIS — N185 Chronic kidney disease, stage 5: Secondary | ICD-10-CM | POA: Diagnosis not present

## 2019-12-29 DIAGNOSIS — T82898A Other specified complication of vascular prosthetic devices, implants and grafts, initial encounter: Secondary | ICD-10-CM | POA: Diagnosis not present

## 2019-12-29 NOTE — Pre-Procedure Instructions (Signed)
[id number] - [place], [address] [place] [address] Phone: [phone] Fax: [phone]     Your procedure is scheduled on Wednesday, May 5th, from 08:30 AM- 12:30 PM.  Report to [facility] Main Entrance "A" at 06:30 A.M., and check in at the Admitting office.  Call this number if you have problems the morning of surgery:  [phone]  Call [phone] if you have any questions prior to your surgery date Monday-Friday 8am-4pm.    Remember:  Do not eat or drink after midnight the night before your surgery (Tues)     Take these medicines the morning of surgery with A SIP OF WATER : amLODipine (NORVASC) atorvastatin (LIPITOR) Carvedilol hydrALAZINE (APRESOLINE) isosorbide mononitrate (IMDUR)  metoprolol tartrate (LOPRESSOR)  Symbicort Inhaler  IF NEEDED: oxyCODONE-acetaminophen (PERCOCET/ROXICET) Albuterol Inhaler If needed (bring)   Follow your surgeon's instructions on when to stop Aspirin.  If no instructions were given by your surgeon then you will need to call the office to get those instructions.    As of today, STOP taking any Aspirin containing products, Aleve, Naproxen, Ibuprofen, Motrin, Advil, Goody's, BC's, all herbal medications, fish oil, and all vitamins.                      Do not wear jewelry.            Do not wear lotions, powders, colognes, or deodorant.            Men may shave face and neck.            Do not bring valuables to the hospital.            [facility] is not responsible for any belongings or valuables.  Do NOT Smoke (Tobacco/Vapping) or drink Alcohol 24 hours prior to your procedure.  If you use a CPAP at night, you may bring all equipment for your overnight stay.   Contacts, glasses, dentures or bridgework may not be worn into surgery.      For patients admitted to the hospital, discharge time will be determined by your treatment team.   Patients discharged the day of surgery will not be  allowed to drive home, and someone needs to stay with them for 24 hours.    Special instructions:   [place]- Preparing For Surgery  Before surgery, you can play an important role. Because skin is not sterile, your skin needs to be as free of germs as possible. You can reduce the number of germs on your skin by washing with CHG (chlorahexidine gluconate) Soap before surgery.  CHG is an antiseptic cleaner which kills germs and bonds with the skin to continue killing germs even after washing.    Oral Hygiene is also important to reduce your risk of infection.  Remember - BRUSH YOUR TEETH THE MORNING OF SURGERY WITH YOUR REGULAR TOOTHPASTE  Please do not use if you have an allergy to CHG or antibacterial soaps. If your skin becomes reddened/irritated stop using the CHG.  Do not shave (including legs and underarms) for at least 48 hours prior to first CHG shower. It is OK to shave your face.  Please follow these instructions carefully.   1. Shower the NIGHT BEFORE SURGERY-Tues and the MORNING OF [place] with CHG Soap.   2. If you chose to wash your hair, wash your hair first as usual with your normal shampoo.  3. After you shampoo, rinse your hair and body  thoroughly to remove the shampoo.  4. Use CHG as you would any other liquid soap. You can apply CHG directly to the skin and wash gently with a scrungie or a clean washcloth.   5. Apply the CHG Soap to your body ONLY FROM THE NECK DOWN.  Do not use on open wounds or open sores. Avoid contact with your eyes, ears, mouth and genitals (private parts). Wash Face and genitals (private parts)  with your normal soap.   6. Wash thoroughly, paying special attention to the area where your surgery will be performed.  7. Thoroughly rinse your body with warm water from the neck down.  8. DO NOT shower/wash with your normal soap after using and rinsing off the CHG Soap.  9. Pat yourself dry with a CLEAN TOWEL.  10. Wear CLEAN PAJAMAS to bed  the night before surgery, wear comfortable clothes the morning of surgery  11. Place CLEAN SHEETS on your bed the night of your first shower and DO NOT SLEEP WITH PETS.   Day of Surgery:   Do not apply any deodorants/lotions.  Please wear clean clothes to the hospital/surgery center.   Remember to brush your teeth WITH YOUR REGULAR TOOTHPASTE.   Please read over the following fact sheets that you were given.

## 2019-12-29 NOTE — Progress Notes (Addendum)
Patient denies No fever, cough and chest pain.  Patient has SOB but nothing new from baseline per patient 12/30/19.  PCP - Dr Betty [name] (Not seen since 2017) Cardiologist - [name], DO  Chest x-ray - 12/30/19 EKG - 06/13/19, 12/30/19 Per Ebony Hail, PA Stress Test - n/a ECHO - 05/17/19 Cardiac Cath - n/a  Aspirin Instructions: Last dose was on Monday, 12/26/19.  Anesthesia review: Yes.  Ebony Hail, [place].   Reported Ox sats on RA 86-87 %.  Was seen yesterday at Vascular Surgery by Karoline Caldwell, PA-C where his O2 sats were 85% RA.  Ebony Hail saw patient at PAT appt.  Coronavirus Screening Covid test scheduled on 01/02/20 at 0925. Have you experienced the following symptoms:  Cough yes/no: No Fever (>100.51F)  yes/no: No Runny nose yes/no: No Sore throat yes/no: No Difficulty breathing/shortness of breath  yes/no: No  Have you traveled in the last 14 days and where? yes/no: No  Patient verbalized understanding of instructions that were given to them at the PAT appointment.

## 2019-12-29 NOTE — Progress Notes (Signed)
Office Note     CC:  follow up Requesting Provider:  Charlott Rakes, MD  HPI: Thomas Robinson is a 59 y.o. 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 He is status post plication of aneurysm of left radiocephalic fistula 40/10/27 by Dr. Scot Dock. This has healed well. He has not had any issues with the fistula since his plication. States that it is working well. No issues with bleeding, swelling of the left upper extremity, pain, trouble with clotting or difficulty with needle sticks at dialysis. He has mild tenderness in area of previous plication if he hits area. Otherwise no pain. He dialyzes Tue/Thur/Sat. He denies any steal symptoms.   The pt is on a statin for cholesterol management.  The pt is on a daily aspirin.   Other AC:  none The pt is on BB, Hydralazine, CCB for hypertension.   The pt is not diabetic.  Tobacco hx:  Never smoker  Past Medical History:  Diagnosis Date  . Acute kidney injury superimposed on CKD ([place]) 05/24/2017  . Acute on chronic diastolic CHF (congestive heart failure) ([place]) 04/13/2018  . AKI (acute kidney injury) ([place]) 04/10/2015  . Asthma   . Asthma, chronic, unspecified asthma severity, with acute exacerbation 10/23/2017  . Atypical chest pain 02/16/2018  . CAP (community acquired pneumonia) 10/23/2017  . Cerebral infarction due to thrombosis of cerebral artery ([place])   . Cerebrovascular accident (CVA) ([place])   . Chronic kidney disease   . CKD (chronic kidney disease), stage V ([place]) 05/24/2017  . ESRD (end stage renal disease) on dialysis ([place]) 02/16/2018  . Essential hypertension 04/09/2015  . GERD (gastroesophageal reflux disease) 06/04/2019  . GI bleeding 05/24/2017  . History of completed stroke   . Hyperlipidemia   . Hypertension   . Hypertensive urgency 05/24/2017  . Kidney failure   . Respiratory failure, acute ([place]) 10/23/2017  . Restless leg syndrome   . Rib fractures 06/2019   Right  . Stroke ([place])   .  Symptomatic anemia 05/24/2017  . Thoracic ascending aortic aneurysm ([facility])    4.5 cm 06/04/19 CT    Past Surgical History:  Procedure Laterality Date  . AV FISTULA PLACEMENT Left 05/28/2017   Procedure: LEFT ARM ARTERIOVENOUS (AV) FISTULA CREATION;  Surgeon: Conrad Hudson, MD;  Location: [facility];  Service: Vascular;  Laterality: Left;  . EYE SURGERY Right 06/02/2019   Cataract removed  . FRACTURE SURGERY    . IR FLUORO GUIDE CV LINE RIGHT  05/25/2017  . IR US GUIDE VASC ACCESS RIGHT  05/25/2017  . REVISON OF ARTERIOVENOUS FISTULA Left 07/18/2019   Procedure: REVISION PLICATION OF RADIOCEPHALIC ARTERIOVENOUS FISTULA LEFT ARM;  Surgeon: Angelia Mould, MD;  Location: [facility];  Service: Vascular;  Laterality: Left;  . RINOPLASTY      Social History   Socioeconomic History  . Marital status: Single    Spouse name: Not on file  . Number of children: Not on file  . Years of education: Not on file  . Highest education level: Not on file  Occupational History  . Not on file  Tobacco Use  . Smoking status: Never Smoker  . Smokeless tobacco: Never Used  Substance and Sexual Activity  . Alcohol use: Not Currently    Alcohol/week: 0.0 standard drinks    Comment: has a drink once in a while- 07/2018  . Drug use: No  . Sexual activity: Not on file  Other Topics Concern  .  Not on file  Social History Narrative   Lives with a roommate in a one story home.  Has 1 child.  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  Education: high school.   Social Determinants of Health   Financial Resource Strain:   . Difficulty of Paying Living Expenses:   Food Insecurity:   . Worried About Charity fundraiser in the Last Year:   . Arboriculturist in the Last Year:   Transportation Needs:   . Film/video editor (Medical):   [name] Lack of Transportation (Non-Medical):   Physical Activity:   . Days of Exercise per Week:   . Minutes of Exercise per Session:   Stress:   . Feeling of Stress :   Social  Connections:   . Frequency of Communication with Friends and Family:   . Frequency of Social Gatherings with Friends and Family:   . Attends Religious Services:   . Active Member of Clubs or Organizations:   . Attends Archivist Meetings:   [name] Marital Status:   Intimate Partner Violence:   . Fear of Current or Ex-Partner:   . Emotionally Abused:   [name] Physically Abused:   . Sexually Abused:     Family History  Problem Relation Age of Onset  . Cancer Mother     Current Outpatient Medications  Medication Sig Dispense Refill  . albuterol (PROVENTIL HFA;VENTOLIN HFA) 108 (90 BASE) MCG/ACT inhaler Inhale 1-2 puffs into the lungs every 6 (six) hours as needed for wheezing or shortness of breath. 1 Inhaler 0  . amLODipine (NORVASC) 10 MG tablet Take 1 tablet (10 mg total) by mouth daily. 30 tablet 0  . aspirin EC 81 MG EC tablet Take 1 tablet (81 mg total) by mouth daily. 30 tablet 0  . budesonide-formoterol (SYMBICORT) 80-4.5 MCG/ACT inhaler Inhale 2 puffs into the lungs 2 (two) times daily. 1 Inhaler 1  . carvedilol (COREG) 12.5 MG tablet Take 1 tablet (12.5 mg total) by mouth 2 (two) times daily with a meal. 60 tablet 0  . hydrALAZINE (APRESOLINE) 100 MG tablet Take 1 tablet (100 mg total) by mouth every 8 (eight) hours. (Patient taking differently: Take 100 mg by mouth 2 (two) times daily. ) 90 tablet 0  . metoprolol tartrate (LOPRESSOR) 100 MG tablet Take 100 mg by mouth 2 (two) times daily.     [name] oxyCODONE (ROXICODONE) 5 MG immediate release tablet Take 1 tablet (5 mg total) by mouth every 4 (four) hours as needed. 12 tablet 0  . oxyCODONE-acetaminophen (PERCOCET/ROXICET) 5-325 MG tablet Take 1 tablet by mouth 2 (two) times daily as needed for pain.    . sevelamer (RENAGEL) 800 MG tablet Take 2,400 mg by mouth 3 (three) times daily with meals.   6  . atorvastatin (LIPITOR) 20 MG tablet Take 1 tablet (20 mg total) by mouth daily. 90 tablet 1  . isosorbide mononitrate (IMDUR) 30  MG 24 hr tablet Take 1 tablet (30 mg total) by mouth daily. 90 tablet 1   No current facility-administered medications for this visit.    No Known Allergies   REVIEW OF SYSTEMS:  Review of Systems  Constitutional: Negative for chills, fever and malaise/fatigue.  Respiratory: Negative for cough and shortness of breath.   Cardiovascular: Negative for chest pain, palpitations, claudication and leg swelling.  Gastrointestinal: Negative for abdominal pain, constipation, diarrhea, nausea and vomiting.  Genitourinary: Negative for dysuria.  Musculoskeletal: Negative for myalgias.  Neurological: Negative for  dizziness, weakness and headaches.  Endo/Heme/Allergies: Does not bruise/bleed easily.    PHYSICAL EXAMINATION:  Vitals:   12/29/19 1008  BP: (!) 164/80  Pulse: 66  Resp: 16  Temp: (!) 97.2 F (36.2 C)  TempSrc: Oral  SpO2: (!) 85%  Weight: 137 lb (62.1 kg)  Height: 5\' 8"  (1.727 m)    General:  Thin, pleasant male, not in any discomfort ; vital signs documented above Gait: Normal HENT: WNL, normocephalic Pulmonary: normal non-labored breathing , without wheezing Cardiac: regular HR, without  Murmurs without carotid bruit Abdomen: soft, NT, no masses Skin: without rashes Vascular Exam/Pulses: left radiocephalic AV fistula with palpable thrill/ good bruit. He does have some pulsatility to the fistula. No thinning of skin or significant aneurysmal areas of concern. Veins in the left upper extremity are very prominent  Right Left  Radial 2+ (normal) 2+ (normal)  Ulnar 2+ (normal) 2+ (normal)   Extremities: without ischemic changes, without Gangrene , without cellulitis; without open wounds;  Musculoskeletal: no muscle wasting or atrophy  Neurologic: A&O X 3;  No focal weakness or paresthesias are detected Psychiatric:  The pt has Normal affect.   ASSESSMENT/PLAN:: 59 y.o. male here for follow up for his right radiocephalic AV fistula. He is s/p plication on 91/66/06 by  Dr. Scot Dock. Patient presented worried about the surgical area becoming larger. I reassured him that It looks normal, there is no concerning overlying skin thinning, the fistula is functioning well. There is some pulsatility which on prior duplex may be due to some more central venous stenosis but this is not affecting functionality of the fistula at this time. Recommend that they rotate areas of cannulation of the fistula to avoid sticking same area and causing recurrence of aneurysm. Patient questions and concerns were addressed. He will follow up as needed if he has any concerns or if there are any issues with the function of his fistula   Karoline Caldwell, PA-C [facility] [phone]  Clinic MD: Dr. Scot Dock

## 2019-12-30 ENCOUNTER — Encounter (HOSPITAL_COMMUNITY)
Admission: RE | Admit: 2019-12-30 | Discharge: 2019-12-30 | Disposition: A | Discharge status: Home / Self Care | Payer: Medicare Other | Source: Ambulatory Visit | Attending: Orthopedic Surgery | Admitting: Orthopedic Surgery

## 2019-12-30 ENCOUNTER — Ambulatory Visit (HOSPITAL_COMMUNITY)
Admission: RE | Admit: 2019-12-30 | Discharge: 2019-12-30 | Disposition: A | Discharge status: Home / Self Care | Payer: Medicare Other | Source: Ambulatory Visit | Attending: Orthopedic Surgery | Admitting: Orthopedic Surgery

## 2019-12-30 ENCOUNTER — Encounter (HOSPITAL_COMMUNITY): Payer: Self-pay

## 2019-12-30 ENCOUNTER — Emergency Department (HOSPITAL_COMMUNITY): Payer: Medicare Other

## 2019-12-30 ENCOUNTER — Encounter (HOSPITAL_COMMUNITY): Payer: Self-pay | Admitting: Emergency Medicine

## 2019-12-30 ENCOUNTER — Other Ambulatory Visit: Payer: Self-pay

## 2019-12-30 ENCOUNTER — Emergency Department (HOSPITAL_COMMUNITY)
Admission: EM | Admit: 2019-12-30 | Discharge: 2019-12-30 | Disposition: A | Discharge status: Home / Self Care | Payer: Medicare Other | Attending: Emergency Medicine | Admitting: Emergency Medicine

## 2019-12-30 DIAGNOSIS — Z79899 Other long term (current) drug therapy: Secondary | ICD-10-CM | POA: Insufficient documentation

## 2019-12-30 DIAGNOSIS — R0902 Hypoxemia: Secondary | ICD-10-CM | POA: Insufficient documentation

## 2019-12-30 DIAGNOSIS — E8779 Other fluid overload: Secondary | ICD-10-CM | POA: Diagnosis not present

## 2019-12-30 DIAGNOSIS — Z992 Dependence on renal dialysis: Secondary | ICD-10-CM | POA: Insufficient documentation

## 2019-12-30 DIAGNOSIS — I5032 Chronic diastolic (congestive) heart failure: Secondary | ICD-10-CM | POA: Diagnosis not present

## 2019-12-30 DIAGNOSIS — Z01818 Encounter for other preprocedural examination: Secondary | ICD-10-CM | POA: Insufficient documentation

## 2019-12-30 DIAGNOSIS — Z20822 Contact with and (suspected) exposure to covid-19: Secondary | ICD-10-CM | POA: Diagnosis not present

## 2019-12-30 DIAGNOSIS — N186 End stage renal disease: Secondary | ICD-10-CM | POA: Diagnosis not present

## 2019-12-30 DIAGNOSIS — Z7982 Long term (current) use of aspirin: Secondary | ICD-10-CM | POA: Insufficient documentation

## 2019-12-30 DIAGNOSIS — I132 Hypertensive heart and chronic kidney disease with heart failure and with stage 5 chronic kidney disease, or end stage renal disease: Secondary | ICD-10-CM | POA: Insufficient documentation

## 2019-12-30 DIAGNOSIS — R0602 Shortness of breath: Secondary | ICD-10-CM | POA: Diagnosis present

## 2019-12-30 HISTORY — DX: Unspecified osteoarthritis, unspecified site: M19.90

## 2019-12-30 HISTORY — DX: Hypoxemia: R09.02

## 2019-12-30 HISTORY — DX: Cough, unspecified: R05.9

## 2019-12-30 HISTORY — DX: Unspecified cataract: H26.9

## 2019-12-30 HISTORY — DX: Dyspnea, unspecified: R06.00

## 2019-12-30 LAB — BASIC METABOLIC PANEL
Anion gap: 20 — ABNORMAL HIGH (ref 5–15)
BUN: 80 mg/dL — ABNORMAL HIGH (ref 6–20)
CO2: 20 mmol/L — ABNORMAL LOW (ref 22–32)
Calcium: 9.5 mg/dL (ref 8.9–10.3)
Chloride: 95 mmol/L — ABNORMAL LOW (ref 98–111)
Creatinine, Ser: 11.86 mg/dL — ABNORMAL HIGH (ref 0.61–1.24)
GFR calc Af Amer: 5 mL/min — ABNORMAL LOW (ref >60)
GFR calc non Af Amer: 4 mL/min — ABNORMAL LOW (ref >60)
Glucose, Bld: 102 mg/dL — ABNORMAL HIGH (ref 70–99)
Potassium: 5.5 mmol/L — ABNORMAL HIGH (ref 3.5–5.1)
Sodium: 135 mmol/L (ref 135–145)

## 2019-12-30 LAB — CBC
HCT: 31.1 % — ABNORMAL LOW (ref 39.0–52.0)
Hemoglobin: 9.9 g/dL — ABNORMAL LOW (ref 13.0–17.0)
MCH: 33.9 pg (ref 26.0–34.0)
MCHC: 31.8 g/dL (ref 30.0–36.0)
MCV: 106.5 fL — ABNORMAL HIGH (ref 80.0–100.0)
Platelets: 151 10*3/uL (ref 150–400)
RBC: 2.92 MIL/uL — ABNORMAL LOW (ref 4.22–5.81)
RDW: 15 % (ref 11.5–15.5)
WBC: 6.3 10*3/uL (ref 4.0–10.5)
nRBC: 0 % (ref 0.0–0.2)

## 2019-12-30 LAB — RESPIRATORY PANEL BY RT PCR (FLU A&B, COVID)
Influenza A by PCR: NEGATIVE
Influenza B by PCR: NEGATIVE
SARS Coronavirus 2 by RT PCR: NEGATIVE

## 2019-12-30 LAB — TROPONIN I (HIGH SENSITIVITY): Troponin I (High Sensitivity): 46 ng/L — ABNORMAL HIGH (ref <18)

## 2019-12-30 LAB — SURGICAL PCR SCREEN
MRSA, PCR: NEGATIVE
Staphylococcus aureus: NEGATIVE

## 2019-12-30 LAB — BRAIN NATRIURETIC PEPTIDE: B Natriuretic Peptide: >4500 pg/mL — ABNORMAL HIGH (ref 0.0–100.0)

## 2019-12-30 IMAGING — MR MRI CERVICAL SPINE WITHOUT CONTRAST
4 of 5 series · 28 of 48 positions shown · non-contrast
Comparison: Cervical radiography 12/03/2018

CLINICAL DATA: Left neck and arm pain

EXAM:
MRI CERVICAL SPINE WITHOUT CONTRAST
TECHNIQUE: Multiplanar, multisequence MR imaging of the cervical spine was
performed. No intravenous contrast was administered.

[Series 2: T2 · sagittal · 3.0mm · 0.41mm/px · 7 of 13 slices shown (1 of 2)]
[im 1/13]
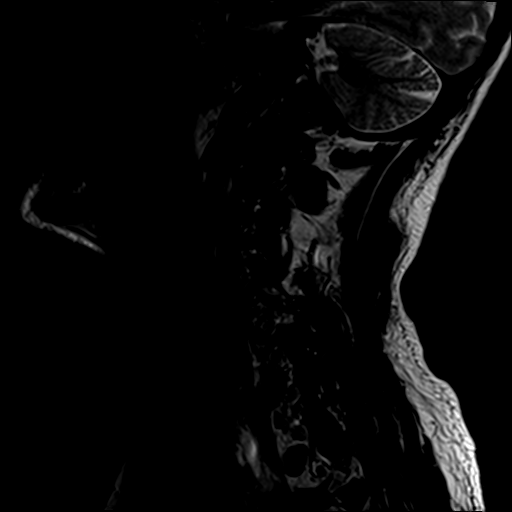
[im 3/13]
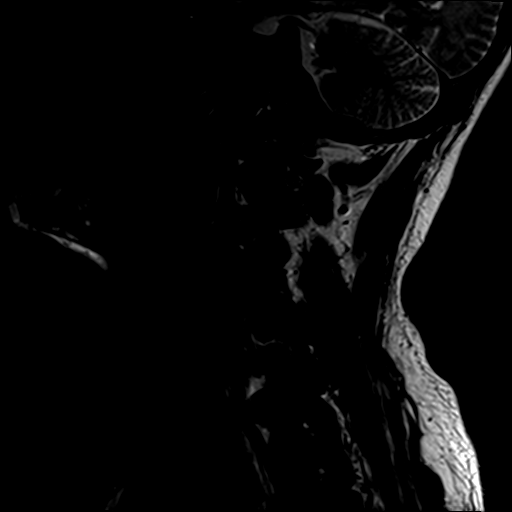
[im 5/13]
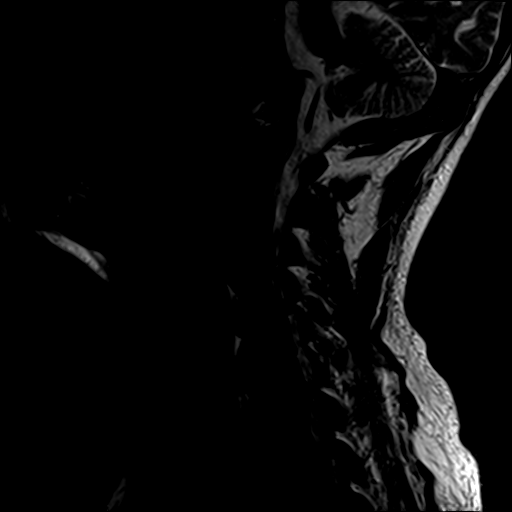
[im 7/13]
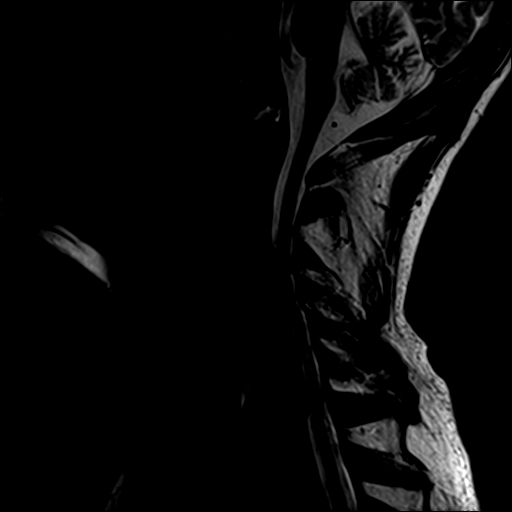
[im 9/13]
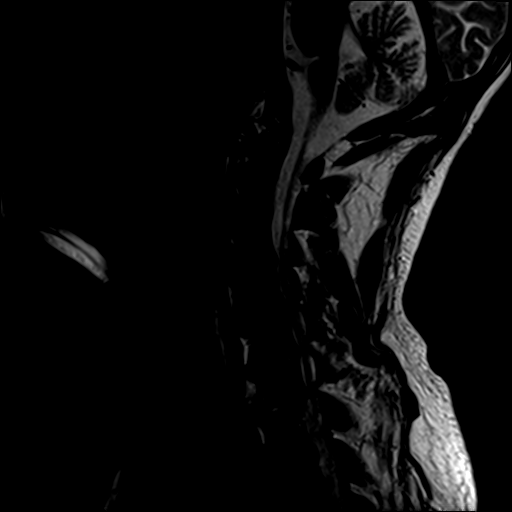
[im 11/13]
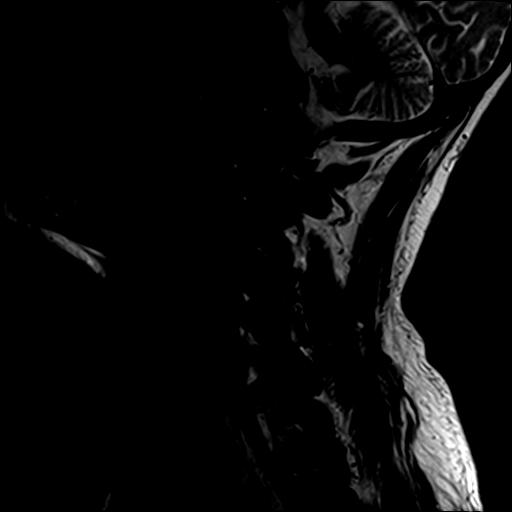
[im 13/13]
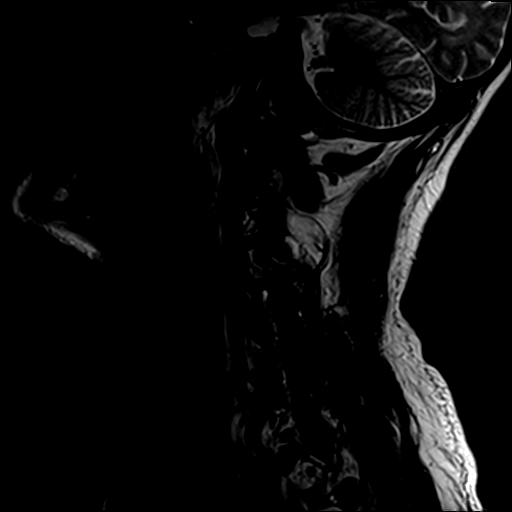

[Series 3: T1 · sagittal · 3.0mm · 0.41mm/px · 7 of 13 slices shown]
[im 1/13]
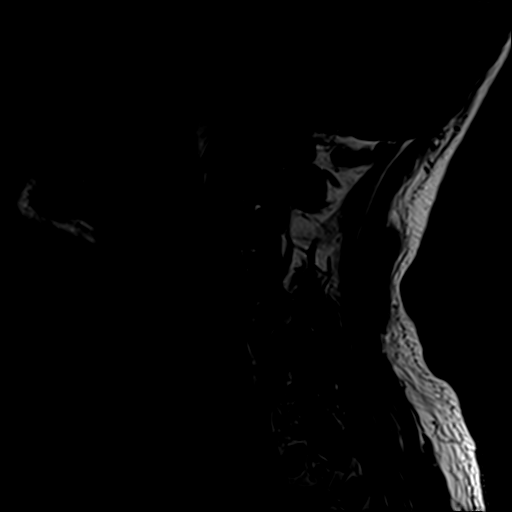
[im 3/13]
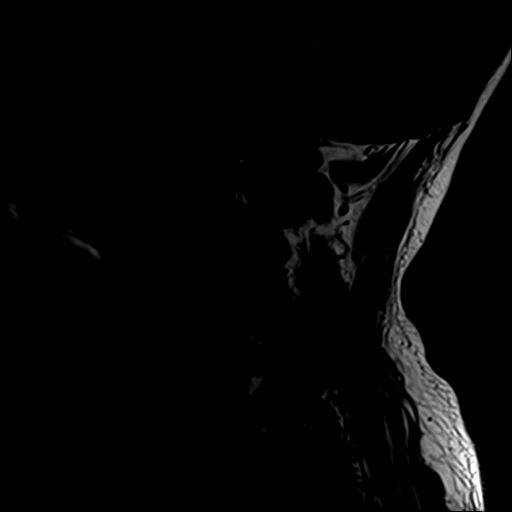
[im 5/13]
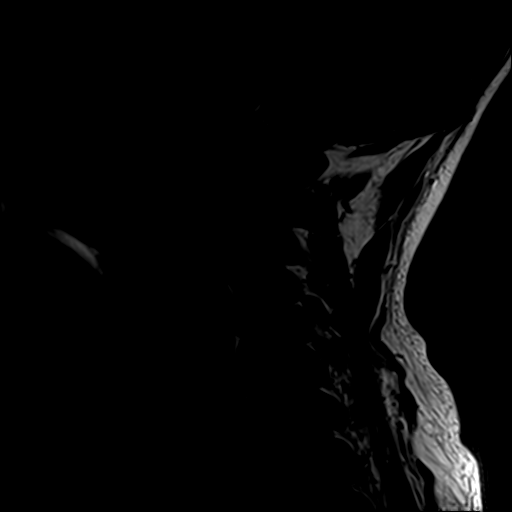
[im 7/13]
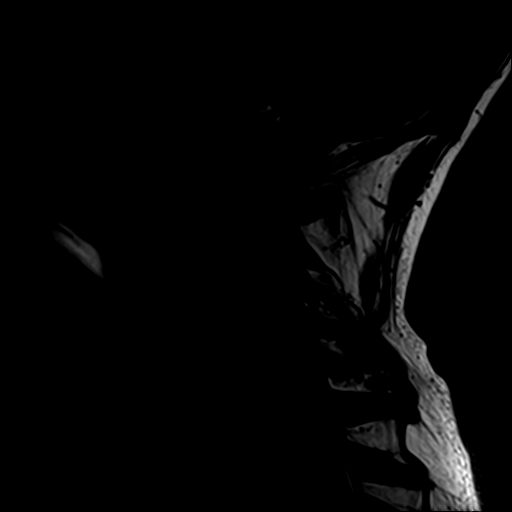
[im 9/13]
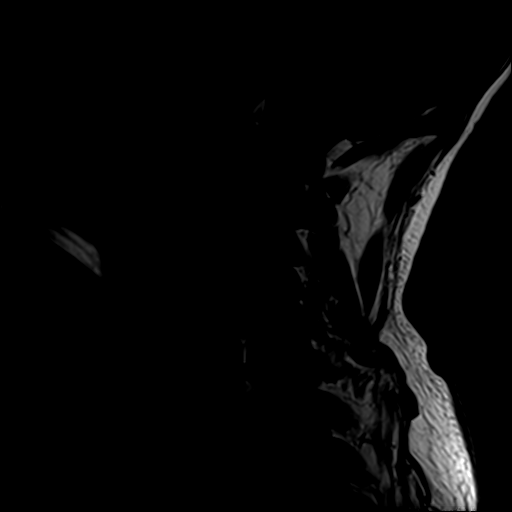
[im 11/13]
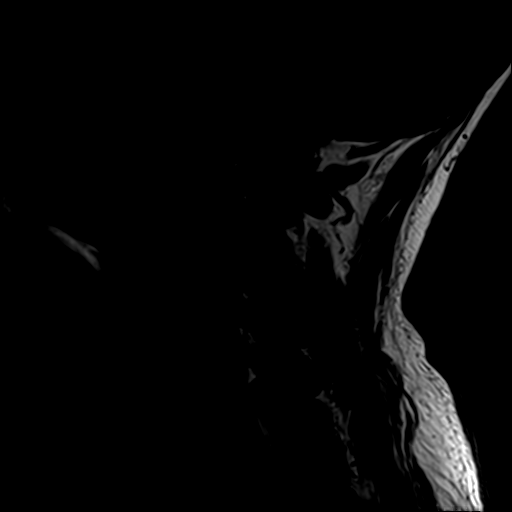
[im 13/13]
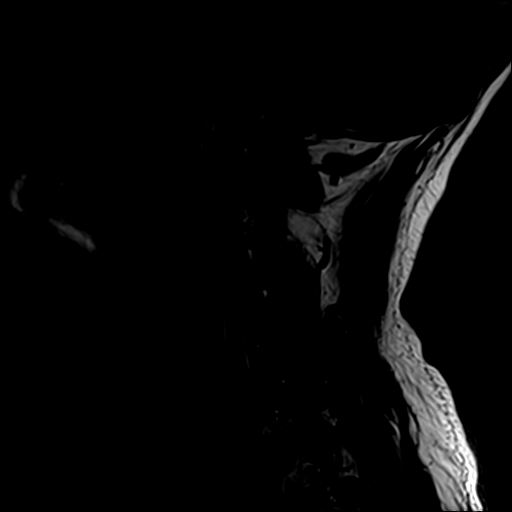

[Series 4: STIR · sagittal · 3.0mm · 0.82mm/px · 6 of 13 slices shown]
[im 1/13]
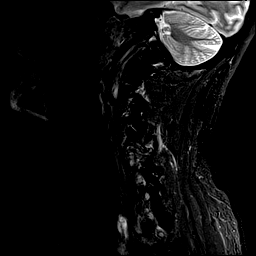
[im 3/13]
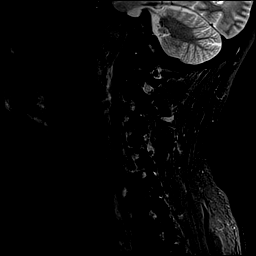
[im 5/13]
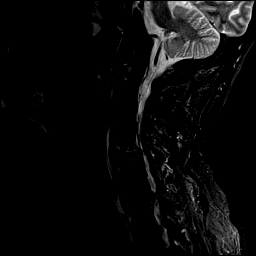
[im 8/13]
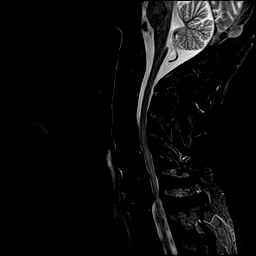
[im 10/13]
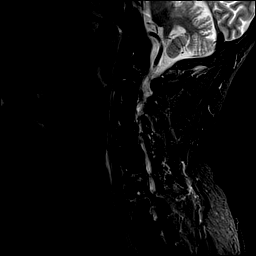
[im 13/13]
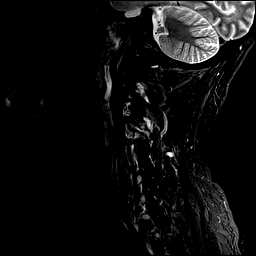

[Series 6: T2 · axial · 3.0mm · 0.70mm/px · z∈[-76,+24]mm · 8 of 28 slices shown (2 of 2)]
[im 1/28]
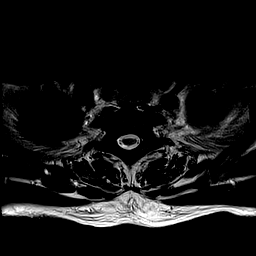
[im 5/28]
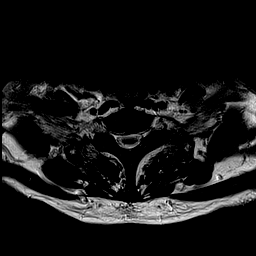
[im 9/28]
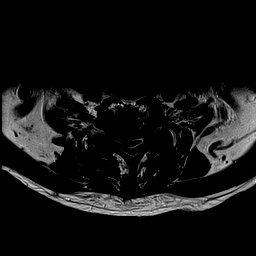
[im 13/28]
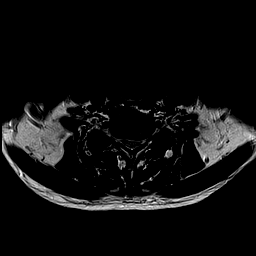
[im 15/28]
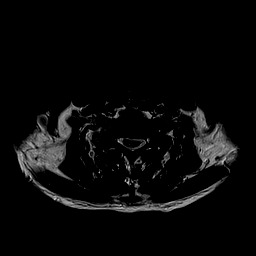
[im 19/28]
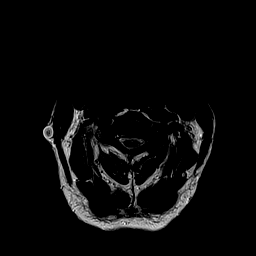
[im 23/28]
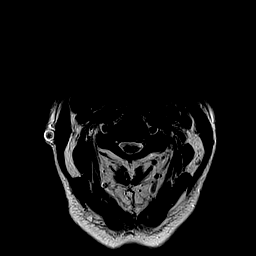
[im 28/28]
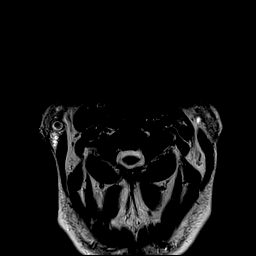

[28 of 48 positions shown; findings below may reference images not displayed]

FINDINGS: Alignment: 2 mm of retrolisthesis at C6-7.

Vertebrae: Marrow edema in the left facets at C3-4 and right facets
at C4-5. No bone lesion or discitis.

Cord: Multilevel degenerative cord compression without signal
abnormality.

Posterior Fossa, vertebral arteries, paraspinal tissues: Negative

Disc levels:

C2-3: Unremarkable.

C3-4: Facet osteoarthritis with asymmetric left-sided spurring and
marrow edema. Bilateral uncovertebral ridging and advanced foraminal
stenosis. Central disc protrusion with mild cord flattening

C4-5: Degenerative facet spurring asymmetric to the right where
there is marrow edema. Borderline anterolisthesis. The disc is
narrowed with endplate and uncovertebral ridging. Biforaminal
impingement and spinal stenosis with cord flattening

C5-6: Degenerative disc narrowing with bulging and endplate ridging.
Mild facet spurring. Spinal stenosis with cord flattening. Left more
than right foraminal impingement

C6-7: Disc narrowing and endplate degeneration with central
protrusion compressing the cord against the mildly thickened
ligamentum flavum. Uncovertebral spurring with severe biforaminal
impingement

C7-T1:Unremarkable.
IMPRESSION: 1. Degenerative disease causes biforaminal impingement and spinal
stenosis with cord deformity from C3-4 to C6-7, cord compression
greatest at C6-7.
2. Superimposed active facet arthritis on the left at C3-4 and right
at C4-5.

## 2019-12-30 MED ORDER — ALBUTEROL SULFATE HFA 108 (90 BASE) MCG/ACT IN AERS
2.0000 | INHALATION_SPRAY | Freq: Once | RESPIRATORY_TRACT | Status: AC
  Administered 2019-12-30: 2 via RESPIRATORY_TRACT
  Filled 2019-12-30: qty 6.7

## 2019-12-30 MED ORDER — PATIROMER SORBITEX CALCIUM 8.4 G PO PACK
16.8000 g | PACK | Freq: Every day | ORAL | Status: DC
  Administered 2019-12-30: 16.8 g via ORAL
  Filled 2019-12-30: qty 2

## 2019-12-30 NOTE — Discharge Instructions (Signed)
Go to dialysis tomorrow as scheduled.  Return to the ER if you experience any new and/or concerning symptoms.

## 2019-12-30 NOTE — ED Notes (Signed)
Patient verbalizes understanding of discharge instructions. Opportunity for questioning and answers were provided. Armband removed by staff, pt discharged from ED to home via pov

## 2019-12-30 NOTE — Progress Notes (Signed)
Ebony Hail, [place] is in PAT Room 5 with patient.

## 2019-12-30 NOTE — ED Provider Notes (Signed)
[facility] EMERGENCY DEPARTMENT Provider Note   CSN: [id number] Arrival date & time: 12/30/19  1453     History Chief Complaint  Patient presents with  . Shortness of Breath    Thomas Robinson is a 59 y.o. male.  59 yo M with a chief complaints of hypoxia.  The patient has neck pain and has a scheduled neck procedure next week.  Showed up for preop evaluation and was found to be hypoxic with an oxygen saturation in the mid 80s.  He is coughing though he states that just just started today when he was driving his car.  Denies coughing prior to this denies fevers denies sick contacts.  Patient goes to dialysis just 2 days a week and tends to cut his dialysis session short because otherwise it gives him a headache and cramps.  States he normally goes about 2 hours at a time.  He denies any chest pain.  Denied any shortness of breath until he was having the coughing today.  Denies abdominal pain.   The history is provided by the patient.  Shortness of Breath Associated symptoms: cough   Associated symptoms: no abdominal pain, no chest pain, no fever, no headaches, no rash and no vomiting   Illness Severity:  Moderate Onset quality:  Gradual Duration:  1 day Timing:  Constant Progression:  Unchanged Chronicity:  New Associated symptoms: cough and shortness of breath   Associated symptoms: no abdominal pain, no chest pain, no congestion, no diarrhea, no fever, no headaches, no myalgias, no rash and no vomiting        Past Medical History:  Diagnosis Date  . Acute kidney injury superimposed on CKD ([place]) 05/24/2017  . Acute on chronic diastolic CHF (congestive heart failure) ([place]) 04/13/2018  . AKI (acute kidney injury) ([place]) 04/10/2015  . Arthritis   . Asthma   . Asthma, chronic, unspecified asthma severity, with acute exacerbation 10/23/2017  . Atypical chest pain 02/16/2018  . CAP (community acquired pneumonia) 10/23/2017  . Cataract    right - removed by  surgery  . Cerebral infarction due to thrombosis of cerebral artery ([place])   . Cerebrovascular accident (CVA) ([place])   . Chronic kidney disease   . CKD (chronic kidney disease), stage V ([place]) 05/24/2017  . Cough    chronic cough  . Dyspnea    "all the time" per patient at PAT 12/30/19  RA sats 87%, no oxygen use  . ESRD (end stage renal disease) on dialysis ([place]) 02/16/2018   Dialysis  T Thus Sat  - [facility]   . Essential hypertension 04/09/2015  . GERD (gastroesophageal reflux disease) 06/04/2019  . GI bleeding 05/24/2017  . History of completed stroke 2018  . Hyperlipidemia   . Hypertension   . Hypertensive urgency 05/24/2017  . Kidney failure   . Oxygen deficiency 12/30/2019   O2 sats on RA 87% at PAT appt   . Respiratory failure, acute ([place]) 10/23/2017  . Restless leg syndrome   . Rib fractures 06/2019   Right  . Stroke ([place]) 2018  . Symptomatic anemia 05/24/2017  . Thoracic ascending aortic aneurysm ([place])    4.5 cm 06/04/19 CT    Patient Active Problem List   Diagnosis Date Noted  . Rib fractures 06/05/2019  . Fall at home, initial encounter 06/04/2019  . Right rib fracture 06/04/2019  . Lung contusion 06/04/2019  . Acute respiratory failure with hypoxia ([place]) 06/04/2019  . Anemia in ESRD (end-stage renal  disease) ([place]) 06/04/2019  . GERD (gastroesophageal reflux disease) 06/04/2019  . Chronic diastolic (congestive) heart failure ([place]) 06/04/2019  . Fall 06/04/2019  . Thoracic ascending aortic aneurysm ([place]) 06/04/2019  . Acute exacerbation of congestive heart failure ([place]) 05/16/2019  . Chest pain 04/13/2018  . Acute on chronic diastolic CHF (congestive heart failure) ([place]) 04/13/2018  . Chest pressure   . Atypical chest pain 02/16/2018  . ESRD on dialysis ([place]) 02/16/2018  . Volume overload 10/23/2017  . Cough 08/23/2017  . Constipation   . Macrocytic anemia 05/24/2017  . Hypertensive urgency 05/24/2017  . History of completed stroke   . Essential  hypertension 04/09/2015    Past Surgical History:  Procedure Laterality Date  . AV FISTULA PLACEMENT Left 05/28/2017   Procedure: LEFT ARM ARTERIOVENOUS (AV) FISTULA CREATION;  Surgeon: Conrad , MD;  Location: [facility];  Service: Vascular;  Laterality: Left;  . EYE SURGERY Right 06/02/2019   Cataract removed  . FRACTURE SURGERY    . IR FLUORO GUIDE CV LINE RIGHT  05/25/2017  . IR US GUIDE VASC ACCESS RIGHT  05/25/2017  . REVISON OF ARTERIOVENOUS FISTULA Left 07/18/2019   Procedure: REVISION PLICATION OF RADIOCEPHALIC ARTERIOVENOUS FISTULA LEFT ARM;  Surgeon: Angelia Mould, MD;  Location: [facility];  Service: Vascular;  Laterality: Left;  . RINOPLASTY         Family History  Problem Relation Age of Onset  . Cancer Mother     Social History   Tobacco Use  . Smoking status: Never Smoker  . Smokeless tobacco: Never Used  Substance Use Topics  . Alcohol use: Not Currently    Alcohol/week: 0.0 standard drinks    Comment: has a drink once in a while- 12/30/19  . Drug use: No    Home Medications Prior to Admission medications   Medication Sig Start Date End Date Taking? Authorizing Provider  albuterol (PROVENTIL HFA;VENTOLIN HFA) 108 (90 BASE) MCG/ACT inhaler Inhale 1-2 puffs into the lungs every 6 (six) hours as needed for wheezing or shortness of breath. 04/12/15   [name], MD  amLODipine (NORVASC) 10 MG tablet Take 1 tablet (10 mg total) by mouth daily. 05/18/19   Martyn Malay, MD  aspirin EC 81 MG EC tablet Take 1 tablet (81 mg total) by mouth daily. 04/16/18   Bonnell Public, MD  atorvastatin (LIPITOR) 20 MG tablet Take 1 tablet (20 mg total) by mouth daily. 06/10/19 09/08/19  Tobb, Kardie, DO  budesonide-formoterol (SYMBICORT) 80-4.5 MCG/ACT inhaler Inhale 2 puffs into the lungs 2 (two) times daily. 05/18/19   Martyn Malay, MD  carvedilol (COREG) 12.5 MG tablet Take 1 tablet (12.5 mg total) by mouth 2 (two) times daily with a meal. 05/18/19   Martyn Malay, MD  hydrALAZINE (APRESOLINE) 100 MG tablet Take 1 tablet (100 mg total) by mouth every 8 (eight) hours. Patient taking differently: Take 100 mg by mouth 2 (two) times daily.  05/18/19   Martyn Malay, MD  isosorbide mononitrate (IMDUR) 30 MG 24 hr tablet Take 1 tablet (30 mg total) by mouth daily. 07/15/19 10/13/19  Tobb, Kardie, DO  metoprolol tartrate (LOPRESSOR) 100 MG tablet Take 100 mg by mouth 2 (two) times daily.     [provider]  oxyCODONE (ROXICODONE) 5 MG immediate release tablet Take 1 tablet (5 mg total) by mouth every 4 (four) hours as needed. 07/18/19   Angelia Mould, MD  oxyCODONE-acetaminophen (PERCOCET/ROXICET) 5-325 MG tablet Take 1 tablet  by mouth 2 (two) times daily as needed for pain. 12/15/19   [provider]  sevelamer (RENAGEL) 800 MG tablet Take 2,400 mg by mouth 3 (three) times daily with meals.  01/25/18   [provider]    Allergies    Patient has no known allergies.  Review of Systems   Review of Systems  Constitutional: Negative for chills and fever.  HENT: Negative for congestion and facial swelling.   Eyes: Negative for discharge and visual disturbance.  Respiratory: Positive for cough and shortness of breath.   Cardiovascular: Negative for chest pain and palpitations.  Gastrointestinal: Negative for abdominal pain, diarrhea and vomiting.  Musculoskeletal: Negative for arthralgias and myalgias.  Skin: Negative for color change and rash.  Neurological: Negative for tremors, syncope and headaches.  Psychiatric/Behavioral: Negative for confusion and dysphoric mood.    Physical Exam Updated Vital Signs BP (!) 188/87   Pulse (!) 59   Temp 98 F (36.7 C) (Oral)   Resp 20   Ht 5' 7.5" (1.715 m)   Wt 62.1 kg   SpO2 96%   BMI 21.14 kg/m   Physical Exam Vitals and nursing note reviewed.  Constitutional:      Appearance: He is well-developed.  HENT:     Head: Normocephalic and atraumatic.  Eyes:     Pupils:  Pupils are equal, round, and reactive to light.  Neck:     Vascular: JVD ( Up to the jaw) present.  Cardiovascular:     Rate and Rhythm: Normal rate and regular rhythm.     Heart sounds: No murmur. No friction rub. No gallop.   Pulmonary:     Effort: No respiratory distress.     Breath sounds: No wheezing.  Abdominal:     General: There is no distension.     Tenderness: There is no guarding or rebound.  Musculoskeletal:        General: Normal range of motion.     Cervical back: Normal range of motion and neck supple.     Right lower leg: Edema present.     Left lower leg: Edema present.     Comments: 3+ edema to bilateral lower extremities.  Skin:    Coloration: Skin is not pale.     Findings: No rash.  Neurological:     Mental Status: He is alert and oriented to person, place, and time.  Psychiatric:        Behavior: Behavior normal.     ED Results / Procedures / Treatments   Labs (all labs ordered are listed, but only abnormal results are displayed) Labs Reviewed  RESPIRATORY PANEL BY RT PCR (FLU A&B, COVID)  BRAIN NATRIURETIC PEPTIDE  TROPONIN I (HIGH SENSITIVITY)    EKG None  Radiology No results found.  Procedures Procedures (including critical care time)  Medications Ordered in ED Medications  albuterol (VENTOLIN HFA) 108 (90 Base) MCG/ACT inhaler 2 puff (has no administration in time range)    ED Course  I have reviewed the triage vital signs and the nursing notes.  Pertinent labs & imaging results that were available during my care of the patient were reviewed by me and considered in my medical decision making (see chart for details).    MDM Rules/Calculators/A&P                      59 yo M with a chief complaints of hypoxia.  Patient was found to be hypoxic today at his preop  clearance visit.  Of note the patient actually was seen yesterday and had an oxygen saturation of 85% in the vascular surgery office.  He did not feel short of breath  yesterday.  Short of breath today with coughing.  States that he is run out of his inhaler.  No wheezes on my exam.  Lungs are actually clear.  Will obtain a chest x-ray.  Lab work was obtained earlier that showed a mild hyperkalemia at 5.5.  He has an anion gap acidosis likely secondary to uremia.  Discussed with nephrology recommended [place], and as long as he is minimally symptomatic have him follow up with his dialysis center tomorrow.   Will ambulate off O2.  Signed out to Dr. Stark Jock, please see his note for further details of care in the ED.  The patients results and plan were reviewed and discussed.   Any x-rays performed were independently reviewed by myself.   Differential diagnosis were considered with the presenting HPI.  Medications  patiromer Daryll Drown) packet 16.8 g (has no administration in time range)  albuterol (VENTOLIN HFA) 108 (90 Base) MCG/ACT inhaler 2 puff (2 puffs Inhalation Given 12/30/19 1536)    Vitals:   12/30/19 1502 12/30/19 1530 12/30/19 1600  BP: (!) 188/87  (!) 166/79  Pulse: (!) 59 (!) 59 (!) 57  Resp: 20 19 18   Temp: 98 F (36.7 C)    TempSrc: Oral    SpO2: 96% 98% 100%  Weight: 62.1 kg    Height: 5' 7.5" (1.715 m)      Final diagnoses:  None    Admission/ observation were discussed with the admitting physician, patient and/or family and they are comfortable with the plan.   Final Clinical Impression(s) / ED Diagnoses Final diagnoses:  None    Rx / DC Orders ED Discharge Orders    None       Deno Etienne, DO 12/30/19 1628

## 2019-12-30 NOTE — Progress Notes (Signed)
O2 Sats with 3L oxygen via [place] now 99%.

## 2019-12-30 NOTE — ED Notes (Signed)
Pt had blood work and ekg done in pre op earlier today.

## 2019-12-30 NOTE — ED Triage Notes (Addendum)
Pt reports he was in pre op for a neck surgery he is supposed to have, was found to be hypoxic with sats in the 80s on room air, denies any o2 use at home. Pt is on dialysis, last went on Tuesday, states he only goes on Tuesday and saturdays-does not go 3x/ week because of how it makes him feel. Pt also endorses cough that began this morning, denies chest pain.

## 2019-12-30 NOTE — Progress Notes (Signed)
Placed patient in PAT Room 5 waiting for [place], [place] to see patient.  Placed 3 L Ox via [place] on patient.  O2 Sats prior to supplemental oxygen was 86% RA.

## 2019-12-30 NOTE — ED Provider Notes (Signed)
Care assumed from Dr. Tyrone Nine at shift change.  Patient awaiting results of BNP, troponin, and respiratory panel.  These tests have returned showing an elevated BNP greater than 4500 which is expected given his dialysis status.  I suspect he does have some volume overload, however is in no distress and states that he feels fine.  He would not be here had he not been referred here by preop testing due to low oxygen saturations.  His saturations here go to the upper 80s while exerting himself, but he is due for dialysis tomorrow.  Dr. Tyrone Nine discussed the care with Dr. Posey Pronto from nephrology who feels as though the patient is appropriate for outpatient dialysis tomorrow.  He will be discharged.  Patient comfortable with disposition.   Veryl Speak, MD 12/30/19 1723

## 2019-12-30 NOTE — Progress Notes (Addendum)
Anesthesia PAT Evaluation:  Case: [id number] Date/Time: 01/04/20 0815   Procedure: ANTERIOR CERVICAL DECOMPRESSION/DISCECTOMY FUSION C5-7 (N/A ) - 3 hrs   Anesthesia type: General   Pre-op diagnosis: Cervical spondylotic radiculopathy   Location: [facility] OR ROOM 04 / [place] [facility]   Surgeons: Melina Schools, MD      DISCUSSION: Patient is a 59 year old male scheduled for the above procedure. Dr. Rolena Infante had requested anesthesia evaluation due to medical history and currently in-between primary care providers.  He does have cardiology preoperative input.  History includes never smoker, HTN, HLD, chronic diastolic CHF, atypical chest pain (2019), CVA, ESRD (HD TTS, [facility]), GI bleed (2018), anemia, asthma, GERD, ascending TAA (4.5 cm). Mild non-obstructive CAD on 06/14/18 coronary CT. Fall with right 8th and 9th rib fractures, pulmonary contusion 06/2019--required 3L O2 and he failed O2 evaluation but left AMA.  Preoperative cardiology input outlined on 12/26/19 by Leanor Kail, PA, "Elie Goody Woodwas last seen on 3/1/21byDr. Tobb 10/31/19. Since that day, Joaquim Tolen done well. Had mechanical fall few weeks ago. Easily getting > 4 mets of activity. BP is well controlled. Recommended BP goal of less than 130/80 post operatively.  Therefore, based on ACC/AHA guidelines, the patient would be at acceptable risk for the planned procedure without further cardiovascular testing."    Patient arrived to PAT with RA O2 sat 87%, 86% on recheck. O2 sat documented as 85% at 12/29/19 with vascular surgery for evaluation of left AVF aneurysmal segment. He had frequent dry coughing while at his PAT visit which seemed to be aggravated by talking. O2 sats up to 98-99% on 3L/[place]. He reported that he intermittently gets a dry cough, either due to "weather changes" or when due for dialysis. Coughing started this morning after he was riding with the window rolled down.  He ran out of his inhalers about 6 months ago.  He denied fever or sick symptoms. He had not had any recent chest pain, last took USAAawhile ago". Occasional palpitations. Overall feels his breathing has been stable, except for the new since this morning (12/30/19). He has chronic DOE. He intermittently has orthopnea and has to sleep in a recliner. He says they he is always given oxygen at hemodialysis ([place], [place], [phone]). I called and spoke with Starla at his dialysis center to get a better sense of his baseline. She confirms he does use O2 with dialysis but at his request--no recent documented O2 sats there. He apparently misses most Thursday sessions and some weeks misses 2 sessions. He always ends treatments early due to headaches and/or cramps. Last dialysis session was 12/27/19.   Patient with new (or recurrent) cough with O2 sat 85-87%. He hasn't had hemodialysis since 12/27/19, so suspect that may be contributing. CXR showed slight pulmonary vascular congestion without active infiltrate, pneumothorax or pleural effusion. K 5.5, anion gap 20. H/H 9.9/31.1. Given RA hypoxemia and cough, further evaluation in the ED recommended. Patient transferred to ED via wheelchair and remains in ED. Albuterol MDI ordered.BNP > 4500, high sensitivity troponin 46 (consistent with results from 05/16/19). Nephrology also contacted with recommendation for [place]. It appears discharge may be considered, but trying to wean from O2.   Will leave chart for follow-up. If discharged for out-patient hemodialysis then will need to touch base with nephrology at his dialysis center next week to see if hypoxemia resolved with hemodialysis compliance.    Presurgical COVID-19 test is scheduled for 01/02/20.   ADDENDUM 01/02/20  9:28 AM:  Patient's RA O2 sats with exertions in the high 80's in the ED on 12/30/19, so he was discharged after phone consultation with nephrology with plans for hemodialysis on 12/31/19. He is scheduled for  hemodialysis on 01/03/20 with surgery currently scheduled for 01/04/20. I called and spoke with Veneta Penton, PA-C at his hemodialysis center and asked that he be assessed at his dialysis session on 01/03/20 to see hypoxia resolved with hemodialysis. Volume overload (shorten HD treatment followed by missed treatment) was thought to be at least a large contributing factor to his hypoxia on 12/30/19, particularly since no infiltrate on imaging. He did have a dry cough, but otherwise did not appear acutely ill but is scheduled for his COVID-19 test today. Veneta Penton, PA-C plans to follow-up with me on 01/03/20. I updated Sherry at Dr. Bonney Roussel' office. I also notified her of HGB 9.9. He will likely be moved to Dr. Rolena Infante' last case on 01/04/20.    ADDENDUM 01/03/20 9:27 AM:  Veneta Penton, PA-C called this morning to report that Mr. Lococo did have hemodialysis today and although cut his session a little short (he did > 3 hours), he did manage to stay on longer than what he usually allows. About 2L fluid off. O2 sats following his session was 94%. His COVID-19 test from 01/02/20 was negative. Based on this, it appears that his volume overload in setting of missed hemodialysis was the most likely culprit for his hypoxia late last week. I have updated Judeen Hammans at Dr. Rolena Infante' office. Patient will need ISTAT8 on arrival given ESRD. Since HGB just < 10, will plan for T&S as well. Anesthesia team to evaluate on the day of surgery.   VS: BP 138/71   Pulse 94   Temp 36.6 C (Oral)   Resp 16   Ht 5' 7.5" (1.715 m)   Wt 62.3 kg   SpO2 (S) (!) 86% Comment: recheck on RA, pt does not use oxygem and does not have inhalers - Informed Ebony Hail, PA  BMI 21.20 kg/m  (O2 sat documented as 97% on 10/31/19.) O2 sat 98-99% on 3L/[place]. Patient with intermittent dry coughing but no acute distress. Heart irregular, II/VI murmur heard best RSB. Lungs clear without wheezes, rhonchi, or crackles. + LE edema. LUE AVR + thrill.     PROVIDERS: Charlott Rakes, MD is listed as PCP, but he can not remember if this is who his new PCP will be. Says he is to get established with new PCP around June 2021. Michela Pitcher he previously saw [name], Betty, MD last in 2017.   [name], DO is cardiologist. Last evaluation 10/31/19. Felt stable from a CV standpoint at that time. She discussed re-evaluation of TAA ~ 06/2020.  Jamal Maes, MD is nephrologist (sees at [facility]) - He does not see a pulmonologist   LABS: PAT labs noted. See DISCUSSION. Due to ESRD, he will need ISTAT on the day of surgery.  (all labs ordered are listed, but only abnormal results are displayed)  Labs Reviewed  CBC - Abnormal; Notable for the following components:      Result Value   RBC 2.92 (*)    Hemoglobin 9.9 (*)    HCT 31.1 (*)    MCV 106.5 (*)    All other components within normal limits  BASIC METABOLIC PANEL - Abnormal; Notable for the following components:   Potassium 5.5 (*)    Chloride 95 (*)    CO2 20 (*)  Glucose, Bld 102 (*)    BUN 80 (*)    Creatinine, Ser 11.86 (*)    GFR calc non Af Amer 4 (*)    GFR calc Af Amer 5 (*)    Anion gap 20 (*)    All other components within normal limits  SURGICAL PCR SCREEN     IMAGES: CXR 12/30/19: FINDINGS: Minimal enlargement of cardiac silhouette with slight pulmonary vascular congestion. Mediastinal contours normal. Slight chronic prominence of interstitial markings, unchanged. No definite acute infiltrate, pleural effusion or pneumothorax. Scattered endplate spur formation thoracic spine. IMPRESSION: Enlargement of cardiac silhouette with pulmonary vascular congestion. Slight chronic accentuation of interstitial markings without definite acute abnormalities  CT chest/abd/pelvis 06/04/19: IMPRESSION: 1. Acute posterior right eighth and ninth rib fractures. No pneumothorax. 2. Patchy consolidation in the peripheral right lower lobe, compatible with pulmonary  contusion. 3. Mild cardiomegaly. Two-vessel coronary atherosclerosis. 4. Mild diffuse interlobular septal thickening, suggesting mild pulmonary edema. 5. Mosaic attenuation throughout both lungs, a nonspecific finding that can be due to air trapping from small airways disease or mosaic perfusion from pulmonary vascular disease. 6. Ascending thoracic aortic 4.5 cm aneurysm. Ascending thoracic aortic aneurysm. Recommend semi-annual imaging followup by CTA or MRA and referral to cardiothoracic surgery if not already obtained. This recommendation follows 2010 ACCF/AHA/AATS/ACR/ASA/SCA/SCAI/SIR/STS/SVM Guidelines for the Diagnosis and Management of Patients With Thoracic Aortic Disease. Circulation. 2010; 121: D782-U235. Aortic aneurysm NOS (ICD10-I71.9). 7. No evidence of acute traumatic injury in the abdomen or pelvis on this noncontrast scan.   EKG:  EKG 12/30/2019: EKG done at PAT due to irregular rhythm on exam. Sinus rhythm with marked sinus arrhythmia with 1st degree A-V block Possible Left atrial enlargement Prolonged QT (QT/QTc 454/486 ms) Abnormal ECG Confirmed by Veryl Speak [id number]) on 12/30/2019 4:33:59 PM  EKG 06/10/2019 ([facility]) Normal sinus rhythm Possible left atrial enlargement Left ventricular hypertrophy -Early repolarization similar to previous EKG on 06/06/2019   CV: TTE 05/17/2019  SUMMARY LVEF 65-70%, severe LVH (consider work-up for infiltrative cardiomyopathy such as amyloidosis), normal wall motion, grade 1 DD, elevated LV filling pressure, normal RV function, severe LAE, aortic valve sclerosis with moderate AI, trivial MR, mild TR, RVSP 54 mmHg, dilated IVC that collapses, trivial pericardial effusion  Coronary CT 06/24/18  IMPRESSION: 1. Coronary calcium score of 39. This was 53 percentile for age and sex matched control. 2. Normal coronary origin with right dominance. 3. Mild non-obstructive CAD. 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 4. Aneurysmal dilatation of the ascending aorta with maximum diameter 43 mm, no calcifications and no dissection. 5. Dilated pulmonary artery measuring 35 mm suggestive of pulmonary Hypertension.  Carotid US 04/10/2015 Summary:  Mild intimal thickening with no significant extracranial carotid  artery stenosis demonstrated. Vertebrals are patent with antegrade  flow.    Past Medical History:  Diagnosis Date  . Acute kidney injury superimposed on CKD ([place]) 05/24/2017  . Acute on chronic diastolic CHF (congestive heart failure) ([place]) 04/13/2018  . AKI (acute kidney injury) ([place]) 04/10/2015  . Arthritis   . Asthma   . Asthma, chronic, unspecified asthma severity, with acute exacerbation 10/23/2017  . Atypical chest pain 02/16/2018  . CAP (community acquired pneumonia) 10/23/2017  . Cataract    right - removed by surgery  . Cerebral infarction due to thrombosis of cerebral artery ([place])   . Cerebrovascular accident (CVA) ([place])   . Chronic kidney disease   . CKD (chronic kidney disease), stage V ([place]) 05/24/2017  . Cough  chronic cough  . Dyspnea    "all the time" per patient at PAT 12/30/19  RA sats 87%, no oxygen use  . ESRD (end stage renal disease) on dialysis ([place]) 02/16/2018   Dialysis  T Thus Sat  . Essential hypertension 04/09/2015  . GERD (gastroesophageal reflux disease) 06/04/2019  . GI bleeding 05/24/2017  . History of completed stroke 2018  . Hyperlipidemia   . Hypertension   . Hypertensive urgency 05/24/2017  . Kidney failure   . Oxygen deficiency 12/30/2019   O2 sats on RA 87% at PAT appt   . Respiratory failure, acute ([place]) 10/23/2017  . Restless leg syndrome   . Rib fractures 06/2019   Right  . Stroke ([place]) 2018  . Symptomatic anemia 05/24/2017  . Thoracic ascending aortic aneurysm ([facility])    4.5 cm 06/04/19 CT    Past Surgical History:  Procedure Laterality Date  . AV FISTULA PLACEMENT Left 05/28/2017   Procedure: LEFT ARM ARTERIOVENOUS  (AV) FISTULA CREATION;  Surgeon: Conrad Williamston, MD;  Location: [facility];  Service: Vascular;  Laterality: Left;  . EYE SURGERY Right 06/02/2019   Cataract removed  . FRACTURE SURGERY    . IR FLUORO GUIDE CV LINE RIGHT  05/25/2017  . IR US GUIDE VASC ACCESS RIGHT  05/25/2017  . REVISON OF ARTERIOVENOUS FISTULA Left 07/18/2019   Procedure: REVISION PLICATION OF RADIOCEPHALIC ARTERIOVENOUS FISTULA LEFT ARM;  Surgeon: Angelia Mould, MD;  Location: [facility];  Service: Vascular;  Laterality: Left;  . RINOPLASTY      MEDICATIONS: . albuterol (PROVENTIL HFA;VENTOLIN HFA) 108 (90 BASE) MCG/ACT inhaler  . amLODipine (NORVASC) 10 MG tablet  . aspirin EC 81 MG EC tablet  . atorvastatin (LIPITOR) 20 MG tablet  . budesonide-formoterol (SYMBICORT) 80-4.5 MCG/ACT inhaler  . carvedilol (COREG) 12.5 MG tablet  . hydrALAZINE (APRESOLINE) 100 MG tablet  . isosorbide mononitrate (IMDUR) 30 MG 24 hr tablet  . metoprolol tartrate (LOPRESSOR) 100 MG tablet  . oxyCODONE (ROXICODONE) 5 MG immediate release tablet  . oxyCODONE-acetaminophen (PERCOCET/ROXICET) 5-325 MG tablet  . sevelamer (RENAGEL) 800 MG tablet   No current facility-administered medications for this encounter.  Last ASA 12/26/19.   Myra Gianotti, PA-C Surgical Short Stay/Anesthesiology [facility] Phone [phone] [facility] Phone [phone] 12/30/2019 5:38 PM

## 2020-01-02 ENCOUNTER — Other Ambulatory Visit (HOSPITAL_COMMUNITY)
Admission: RE | Admit: 2020-01-02 | Discharge: 2020-01-02 | Disposition: A | Discharge status: Home / Self Care | Payer: Medicare Other | Source: Ambulatory Visit | Attending: Orthopedic Surgery | Admitting: Orthopedic Surgery

## 2020-01-02 ENCOUNTER — Ambulatory Visit: Payer: Self-pay | Admitting: Orthopedic Surgery

## 2020-01-02 DIAGNOSIS — Z20822 Contact with and (suspected) exposure to covid-19: Secondary | ICD-10-CM | POA: Diagnosis not present

## 2020-01-02 DIAGNOSIS — Z01812 Encounter for preprocedural laboratory examination: Secondary | ICD-10-CM | POA: Insufficient documentation

## 2020-01-02 LAB — SARS CORONAVIRUS 2 (TAT 6-24 HRS): SARS Coronavirus 2: NEGATIVE

## 2020-01-03 ENCOUNTER — Ambulatory Visit: Payer: Self-pay | Admitting: Orthopedic Surgery

## 2020-01-03 DIAGNOSIS — Z01818 Encounter for other preprocedural examination: Secondary | ICD-10-CM

## 2020-01-03 NOTE — Anesthesia Preprocedure Evaluation (Addendum)
Anesthesia Evaluation  Patient identified by MRN, date of birth, ID band Patient awake    Reviewed: Allergy & Precautions, NPO status , Patient's Chart, lab work & pertinent test results, reviewed documented beta blocker date and time   Airway Mallampati: II  TM Distance: >3 FB Neck ROM: Full    Dental  (+) Dental Advisory Given   Pulmonary shortness of breath, asthma , pneumonia,    Pulmonary exam normal breath sounds clear to auscultation       Cardiovascular hypertension, Pt. on medications and Pt. on home beta blockers +CHF  Normal cardiovascular exam Rhythm:Regular Rate:Normal     Neuro/Psych CVA negative psych ROS   GI/Hepatic Neg liver ROS, GERD  ,  Endo/Other  negative endocrine ROS  Renal/GU DialysisRenal disease     Musculoskeletal  (+) Arthritis ,   Abdominal   Peds  Hematology negative hematology ROS (+) anemia ,   Anesthesia Other Findings   Reproductive/Obstetrics                            Anesthesia Physical Anesthesia Plan  ASA: III  Anesthesia Plan: General   Post-op Pain Management:    Induction: Intravenous  PONV Risk Score and Plan:   Airway Management Planned: Oral ETT and Video Laryngoscope Planned  Additional Equipment: None  Intra-op Plan:   Post-operative Plan: Extubation in OR  Informed Consent: I have reviewed the patients History and Physical, chart, labs and discussed the procedure including the risks, benefits and alternatives for the proposed anesthesia with the patient or authorized representative who has indicated his/her understanding and acceptance.     Dental advisory given  Plan Discussed with: CRNA  Anesthesia Plan Comments: (See PAT note written 01/03/2020 by Myra Gianotti, PA-C. Has preoperative cardiology input. Hypoxia 86-87% at 12/30/19 PAT visit, but in the setting of volume overload and missed hemodialysis. No infiltrate on  CXR. Last HD 01/03/20 with post-treatment O2 sat 94%. 01/02/20 COVID-19 test negative. Has LUE AVF. )      Anesthesia Quick Evaluation                                  Anesthesia Evaluation  Patient identified by MRN, date of birth, ID band Patient awake    Reviewed: Allergy & Precautions, NPO status , Patient's Chart, lab work & pertinent test results  Airway Mallampati: II  TM Distance: >3 FB     Dental  (+) Dental Advisory Given, Caps,    Pulmonary pneumonia,    breath sounds clear to auscultation       Cardiovascular hypertension, Pt. on medications and Pt. on home beta blockers +CHF   Rhythm:Regular Rate:Normal     Neuro/Psych    GI/Hepatic Neg liver ROS, GERD  ,  Endo/Other  negative endocrine ROS  Renal/GU DialysisRenal diseaseT, TH, SAT Last HD Sat 11/14     Musculoskeletal   Abdominal   Peds  Hematology  (+) anemia ,   Anesthesia Other Findings   Reproductive/Obstetrics                           Anesthesia Physical Anesthesia Plan  ASA: III  Anesthesia Plan: General   Post-op Pain Management:    Induction: Intravenous  PONV Risk Score and Plan: 2 and Ondansetron and Midazolam  Airway Management Planned: LMA  Additional Equipment:  Intra-op Plan:   Post-operative Plan:   Informed Consent: I have reviewed the patients History and Physical, chart, labs and discussed the procedure including the risks, benefits and alternatives for the proposed anesthesia with the patient or authorized representative who has indicated his/her understanding and acceptance.     Dental advisory given  Plan Discussed with: CRNA and Anesthesiologist  Anesthesia Plan Comments:                                   Anesthesia Evaluation  Patient identified by MRN, date of birth, ID band Patient awake    Reviewed: Allergy & Precautions, NPO status , Patient's Chart, lab work & pertinent test results  Airway Mallampati:  II  TM Distance: >3 FB     Dental  (+) Dental Advisory Given, Caps,    Pulmonary pneumonia,    breath sounds clear to auscultation       Cardiovascular hypertension, Pt. on medications and Pt. on home beta blockers +CHF   Rhythm:Regular Rate:Normal     Neuro/Psych    GI/Hepatic Neg liver ROS, GERD  ,  Endo/Other  negative endocrine ROS  Renal/GU DialysisRenal diseaseT, TH, SAT Last HD Sat 11/14     Musculoskeletal   Abdominal   Peds  Hematology  (+) anemia ,   Anesthesia Other Findings   Reproductive/Obstetrics                           Anesthesia Physical Anesthesia Plan  ASA: III  Anesthesia Plan: General   Post-op Pain Management:    Induction: Intravenous  PONV Risk Score and Plan: 2 and Ondansetron and Midazolam  Airway Management Planned: LMA  Additional Equipment:   Intra-op Plan:   Post-operative Plan:   Informed Consent: I have reviewed the patients History and Physical, chart, labs and discussed the procedure including the risks, benefits and alternatives for the proposed anesthesia with the patient or authorized representative who has indicated his/her understanding and acceptance.     Dental advisory given  Plan Discussed with: CRNA and Anesthesiologist  Anesthesia Plan Comments: (Follows with cardiology for HFpEF, CAD (mild, nonobstructive by coronary CT 2019) , monitoring of thoracic aortic aneurysm (4.5 cm by CT 2020). Last seen by Dr. Harriet Masson 07/15/19. Per note he was started on Imdur for treatment of stable angina. It was discussed that he would be having fistula revision surgery on 06/17/19.  Will need DOS labs and eval.    EKG 06/10/19: NSR. Rate 73. Possible LAE. LVH.  TTE 05/17/2019  SUMMARY LVEF 65-70%, severe LVH (consider work-up for infiltrative cardiomyopathy such as amyloidosis), normal wall motion, grade 1 DD, elevated LV filling pressure, normal RV function, severe LAE,  aortic valve sclerosis with moderate AI, trivial MR, mild TR, RVSP 54 mmHg, dilated IVC that collapses, trivial pericardial effusion  Coronary CT 06/24/18  IMPRESSION: 1. Coronary calcium score of 39. This was 69 percentile for age and sex matched control. 2. Normal coronary origin with right dominance. 3. Mild non-obstructive CAD. 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 4. Aneurysmal dilatation of the ascending aorta with maximum diameter 43 mm, no calcifications and no dissection. 5. Dilated pulmonary artery measuring 35 mm suggestive of pulmonary hypertension. )       Anesthesia Quick Evaluation (Follows with cardiology for HFpEF, CAD (mild, nonobstructive by coronary CT 2019) , monitoring of thoracic aortic  aneurysm (4.5 cm by CT 2020). Last seen by Dr. Harriet Masson 07/15/19. Per note he was started on Imdur for treatment of stable angina. It was discussed that he would be having fistula revision surgery on 06/17/19.  Will need DOS labs and eval.    EKG 06/10/19: NSR. Rate 73. Possible LAE. LVH.  TTE 05/17/2019  SUMMARY LVEF 65-70%, severe LVH (consider work-up for infiltrative cardiomyopathy such as amyloidosis), normal wall motion, grade 1 DD, elevated LV filling pressure, normal RV function, severe LAE, aortic valve sclerosis with moderate AI, trivial MR, mild TR, RVSP 54 mmHg, dilated IVC that collapses, trivial pericardial effusion  Coronary CT 06/24/18  IMPRESSION: 1. Coronary calcium score of 39. This was 89 percentile for age and sex matched control. 2. Normal coronary origin with right dominance. 3. Mild non-obstructive CAD. 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 4. Aneurysmal dilatation of the ascending aorta with maximum diameter 43 mm, no calcifications and no dissection. 5. Dilated pulmonary artery measuring 35 mm suggestive of pulmonary hypertension. )       Anesthesia Quick Evaluation

## 2020-01-04 ENCOUNTER — Other Ambulatory Visit: Payer: Self-pay

## 2020-01-04 ENCOUNTER — Observation Stay (HOSPITAL_COMMUNITY)
Admission: RE | Admit: 2020-01-04 | Discharge: 2020-01-05 | Disposition: A | Discharge status: Home / Self Care | Payer: Medicare Other | Attending: Internal Medicine | Admitting: Internal Medicine

## 2020-01-04 ENCOUNTER — Encounter (HOSPITAL_COMMUNITY): Payer: Self-pay | Admitting: Orthopedic Surgery

## 2020-01-04 ENCOUNTER — Ambulatory Visit (HOSPITAL_COMMUNITY): Payer: Medicare Other | Admitting: Vascular Surgery

## 2020-01-04 ENCOUNTER — Ambulatory Visit (HOSPITAL_COMMUNITY): Payer: Medicare Other

## 2020-01-04 ENCOUNTER — Ambulatory Visit (HOSPITAL_COMMUNITY)
Admission: RE | Disposition: A | Discharge status: Home / Self Care | Payer: Self-pay | Source: Home / Self Care | Attending: Internal Medicine

## 2020-01-04 DIAGNOSIS — R0602 Shortness of breath: Secondary | ICD-10-CM | POA: Diagnosis not present

## 2020-01-04 DIAGNOSIS — G2581 Restless legs syndrome: Secondary | ICD-10-CM | POA: Insufficient documentation

## 2020-01-04 DIAGNOSIS — I5032 Chronic diastolic (congestive) heart failure: Secondary | ICD-10-CM | POA: Insufficient documentation

## 2020-01-04 DIAGNOSIS — D631 Anemia in chronic kidney disease: Secondary | ICD-10-CM | POA: Insufficient documentation

## 2020-01-04 DIAGNOSIS — Z7951 Long term (current) use of inhaled steroids: Secondary | ICD-10-CM | POA: Diagnosis not present

## 2020-01-04 DIAGNOSIS — M2578 Osteophyte, vertebrae: Secondary | ICD-10-CM | POA: Diagnosis not present

## 2020-01-04 DIAGNOSIS — Z7982 Long term (current) use of aspirin: Secondary | ICD-10-CM | POA: Insufficient documentation

## 2020-01-04 DIAGNOSIS — R0600 Dyspnea, unspecified: Secondary | ICD-10-CM | POA: Insufficient documentation

## 2020-01-04 DIAGNOSIS — R000 Tachycardia, unspecified: Secondary | ICD-10-CM | POA: Diagnosis not present

## 2020-01-04 DIAGNOSIS — Z992 Dependence on renal dialysis: Secondary | ICD-10-CM | POA: Diagnosis not present

## 2020-01-04 DIAGNOSIS — Z809 Family history of malignant neoplasm, unspecified: Secondary | ICD-10-CM | POA: Insufficient documentation

## 2020-01-04 DIAGNOSIS — M1990 Unspecified osteoarthritis, unspecified site: Secondary | ICD-10-CM | POA: Diagnosis not present

## 2020-01-04 DIAGNOSIS — J45909 Unspecified asthma, uncomplicated: Secondary | ICD-10-CM | POA: Diagnosis not present

## 2020-01-04 DIAGNOSIS — R0902 Hypoxemia: Secondary | ICD-10-CM | POA: Insufficient documentation

## 2020-01-04 DIAGNOSIS — Z79899 Other long term (current) drug therapy: Secondary | ICD-10-CM | POA: Diagnosis not present

## 2020-01-04 DIAGNOSIS — Z8673 Personal history of transient ischemic attack (TIA), and cerebral infarction without residual deficits: Secondary | ICD-10-CM | POA: Insufficient documentation

## 2020-01-04 DIAGNOSIS — N186 End stage renal disease: Secondary | ICD-10-CM | POA: Diagnosis not present

## 2020-01-04 DIAGNOSIS — I132 Hypertensive heart and chronic kidney disease with heart failure and with stage 5 chronic kidney disease, or end stage renal disease: Secondary | ICD-10-CM | POA: Diagnosis not present

## 2020-01-04 DIAGNOSIS — K219 Gastro-esophageal reflux disease without esophagitis: Secondary | ICD-10-CM | POA: Insufficient documentation

## 2020-01-04 DIAGNOSIS — M5020 Other cervical disc displacement, unspecified cervical region: Secondary | ICD-10-CM

## 2020-01-04 DIAGNOSIS — Z01818 Encounter for other preprocedural examination: Secondary | ICD-10-CM

## 2020-01-04 DIAGNOSIS — M4722 Other spondylosis with radiculopathy, cervical region: Principal | ICD-10-CM | POA: Insufficient documentation

## 2020-01-04 DIAGNOSIS — Z419 Encounter for procedure for purposes other than remedying health state, unspecified: Secondary | ICD-10-CM

## 2020-01-04 DIAGNOSIS — E785 Hyperlipidemia, unspecified: Secondary | ICD-10-CM | POA: Diagnosis not present

## 2020-01-04 HISTORY — PX: ANTERIOR CERVICAL DECOMP/DISCECTOMY FUSION: SHX1161

## 2020-01-04 HISTORY — DX: Other cervical disc displacement, unspecified cervical region: M50.20

## 2020-01-04 HISTORY — DX: Tachycardia, unspecified: R00.0

## 2020-01-04 LAB — HEMOGLOBIN A1C
Hgb A1c MFr Bld: 4.9 % (ref 4.8–5.6)
Mean Plasma Glucose: 93.93 mg/dL

## 2020-01-04 LAB — POCT I-STAT, CHEM 8
BUN: 40 mg/dL — ABNORMAL HIGH (ref 6–20)
Calcium, Ion: 1.12 mmol/L — ABNORMAL LOW (ref 1.15–1.40)
Chloride: 97 mmol/L — ABNORMAL LOW (ref 98–111)
Creatinine, Ser: 7.7 mg/dL — ABNORMAL HIGH (ref 0.61–1.24)
Glucose, Bld: 115 mg/dL — ABNORMAL HIGH (ref 70–99)
HCT: 28 % — ABNORMAL LOW (ref 39.0–52.0)
Hemoglobin: 9.5 g/dL — ABNORMAL LOW (ref 13.0–17.0)
Potassium: 3.6 mmol/L (ref 3.5–5.1)
Sodium: 137 mmol/L (ref 135–145)
TCO2: 29 mmol/L (ref 22–32)

## 2020-01-04 LAB — PROTIME-INR
INR: 1.1 (ref 0.8–1.2)
Prothrombin Time: 14.1 seconds (ref 11.4–15.2)

## 2020-01-04 LAB — CBC
HCT: 31.9 % — ABNORMAL LOW (ref 39.0–52.0)
Hemoglobin: 10.6 g/dL — ABNORMAL LOW (ref 13.0–17.0)
MCH: 34.9 pg — ABNORMAL HIGH (ref 26.0–34.0)
MCHC: 33.2 g/dL (ref 30.0–36.0)
MCV: 104.9 fL — ABNORMAL HIGH (ref 80.0–100.0)
Platelets: 189 10*3/uL (ref 150–400)
RBC: 3.04 MIL/uL — ABNORMAL LOW (ref 4.22–5.81)
RDW: 15.3 % (ref 11.5–15.5)
WBC: 5.1 10*3/uL (ref 4.0–10.5)
nRBC: 0 % (ref 0.0–0.2)

## 2020-01-04 LAB — BASIC METABOLIC PANEL
Anion gap: 22 — ABNORMAL HIGH (ref 5–15)
BUN: 42 mg/dL — ABNORMAL HIGH (ref 6–20)
CO2: 24 mmol/L (ref 22–32)
Calcium: 10.1 mg/dL (ref 8.9–10.3)
Chloride: 94 mmol/L — ABNORMAL LOW (ref 98–111)
Creatinine, Ser: 8.07 mg/dL — ABNORMAL HIGH (ref 0.61–1.24)
GFR calc Af Amer: 8 mL/min — ABNORMAL LOW (ref >60)
GFR calc non Af Amer: 7 mL/min — ABNORMAL LOW (ref >60)
Glucose, Bld: 98 mg/dL (ref 70–99)
Potassium: 3.8 mmol/L (ref 3.5–5.1)
Sodium: 140 mmol/L (ref 135–145)

## 2020-01-04 LAB — TROPONIN I (HIGH SENSITIVITY)
Troponin I (High Sensitivity): 44 ng/L — ABNORMAL HIGH (ref <18)
Troponin I (High Sensitivity): 40 ng/L — ABNORMAL HIGH (ref <18)

## 2020-01-04 LAB — TYPE AND SCREEN
ABO/RH(D): A POS
Antibody Screen: NEGATIVE

## 2020-01-04 LAB — GLUCOSE, CAPILLARY: Glucose-Capillary: 214 mg/dL — ABNORMAL HIGH (ref 70–99)

## 2020-01-04 LAB — APTT: aPTT: 36 seconds (ref 24–36)

## 2020-01-04 SURGERY — ANTERIOR CERVICAL DECOMPRESSION/DISCECTOMY FUSION 2 LEVELS
Anesthesia: General | Site: Spine Cervical

## 2020-01-04 MED ORDER — MENTHOL 3 MG MT LOZG: 1.0000 | LOZENGE | OROMUCOSAL | Status: DC | PRN

## 2020-01-04 MED ORDER — LACTATED RINGERS IV SOLN: INTRAVENOUS | Status: DC

## 2020-01-04 MED ORDER — INSULIN ASPART 100 UNIT/ML ~~LOC~~ SOLN: 0.0000 [IU] | Freq: Three times a day (TID) | SUBCUTANEOUS | Status: DC

## 2020-01-04 MED ORDER — HYDROMORPHONE HCL 1 MG/ML IJ SOLN
0.2500 mg | INTRAMUSCULAR | Status: DC | PRN
  Administered 2020-01-04 (×2): 0.25 mg via INTRAVENOUS
  Administered 2020-01-04 (×3): 0.5 mg via INTRAVENOUS

## 2020-01-04 MED ORDER — DEXAMETHASONE SODIUM PHOSPHATE 10 MG/ML IJ SOLN
INTRAMUSCULAR | Status: AC
  Filled 2020-01-04: qty 1

## 2020-01-04 MED ORDER — MIDAZOLAM HCL 2 MG/2ML IJ SOLN
INTRAMUSCULAR | Status: AC
  Filled 2020-01-04: qty 2

## 2020-01-04 MED ORDER — MAGNESIUM CITRATE PO SOLN: 1.0000 | Freq: Once | ORAL | Status: DC | PRN

## 2020-01-04 MED ORDER — OXYCODONE HCL 5 MG PO TABS
10.0000 mg | ORAL_TABLET | ORAL | Status: DC | PRN
  Administered 2020-01-04 (×2): 10 mg via ORAL
  Filled 2020-01-04 (×2): qty 2

## 2020-01-04 MED ORDER — METHOCARBAMOL 1000 MG/10ML IJ SOLN
500.0000 mg | Freq: Four times a day (QID) | INTRAVENOUS | Status: DC | PRN
  Filled 2020-01-04: qty 5

## 2020-01-04 MED ORDER — EPINEPHRINE PF 1 MG/ML IJ SOLN
INTRAMUSCULAR | Status: DC | PRN
  Administered 2020-01-04: .15 mL

## 2020-01-04 MED ORDER — TRANEXAMIC ACID-NACL 1000-0.7 MG/100ML-% IV SOLN
INTRAVENOUS | Status: AC
  Filled 2020-01-04: qty 100

## 2020-01-04 MED ORDER — SODIUM CHLORIDE 0.9% FLUSH: 3.0000 mL | Freq: Two times a day (BID) | INTRAVENOUS | Status: DC

## 2020-01-04 MED ORDER — POLYETHYLENE GLYCOL 3350 17 G PO PACK: 17.0000 g | PACK | Freq: Every day | ORAL | Status: DC | PRN

## 2020-01-04 MED ORDER — THROMBIN 20000 UNITS EX SOLR: CUTANEOUS | Status: DC | PRN

## 2020-01-04 MED ORDER — ACETAMINOPHEN 325 MG PO TABS: 650.0000 mg | ORAL_TABLET | ORAL | Status: DC | PRN

## 2020-01-04 MED ORDER — ROCURONIUM BROMIDE 10 MG/ML (PF) SYRINGE
PREFILLED_SYRINGE | INTRAVENOUS | Status: DC | PRN
  Administered 2020-01-04: 40 mg via INTRAVENOUS

## 2020-01-04 MED ORDER — PROPOFOL 10 MG/ML IV BOLUS
INTRAVENOUS | Status: DC | PRN
  Administered 2020-01-04: 100 mg via INTRAVENOUS

## 2020-01-04 MED ORDER — ONDANSETRON HCL 4 MG/2ML IJ SOLN
INTRAMUSCULAR | Status: AC
  Filled 2020-01-04: qty 2

## 2020-01-04 MED ORDER — PHENOL 1.4 % MT LIQD
1.0000 | OROMUCOSAL | Status: DC | PRN
  Administered 2020-01-04: 1 via OROMUCOSAL
  Filled 2020-01-04: qty 177

## 2020-01-04 MED ORDER — PHENYLEPHRINE 40 MCG/ML (10ML) SYRINGE FOR IV PUSH (FOR BLOOD PRESSURE SUPPORT)
PREFILLED_SYRINGE | INTRAVENOUS | Status: AC
  Filled 2020-01-04: qty 10

## 2020-01-04 MED ORDER — ACETAMINOPHEN 650 MG RE SUPP: 650.0000 mg | RECTAL | Status: DC | PRN

## 2020-01-04 MED ORDER — ALBUMIN HUMAN 5 % IV SOLN: INTRAVENOUS | Status: DC | PRN

## 2020-01-04 MED ORDER — PHENYLEPHRINE HCL-NACL 10-0.9 MG/250ML-% IV SOLN
INTRAVENOUS | Status: DC | PRN
  Administered 2020-01-04: 25 ug/min via INTRAVENOUS

## 2020-01-04 MED ORDER — METHOCARBAMOL 500 MG PO TABS
500.0000 mg | ORAL_TABLET | Freq: Four times a day (QID) | ORAL | Status: DC | PRN
  Administered 2020-01-04: 500 mg via ORAL
  Filled 2020-01-04: qty 1

## 2020-01-04 MED ORDER — PROPOFOL 10 MG/ML IV BOLUS
INTRAVENOUS | Status: AC
  Filled 2020-01-04: qty 20

## 2020-01-04 MED ORDER — DOCUSATE SODIUM 100 MG PO CAPS: 100.0000 mg | ORAL_CAPSULE | Freq: Two times a day (BID) | ORAL | Status: DC

## 2020-01-04 MED ORDER — HYDROMORPHONE HCL 1 MG/ML IJ SOLN
INTRAMUSCULAR | Status: AC
  Filled 2020-01-04: qty 1

## 2020-01-04 MED ORDER — FENTANYL CITRATE (PF) 250 MCG/5ML IJ SOLN
INTRAMUSCULAR | Status: AC
  Filled 2020-01-04: qty 5

## 2020-01-04 MED ORDER — HYDRALAZINE HCL 50 MG PO TABS: 100.0000 mg | ORAL_TABLET | Freq: Two times a day (BID) | ORAL | Status: DC

## 2020-01-04 MED ORDER — EPHEDRINE SULFATE-NACL 50-0.9 MG/10ML-% IV SOSY
PREFILLED_SYRINGE | INTRAVENOUS | Status: DC | PRN
  Administered 2020-01-04: 5 mg via INTRAVENOUS

## 2020-01-04 MED ORDER — LIDOCAINE 2% (20 MG/ML) 5 ML SYRINGE
INTRAMUSCULAR | Status: AC
  Filled 2020-01-04: qty 5

## 2020-01-04 MED ORDER — TRANEXAMIC ACID-NACL 1000-0.7 MG/100ML-% IV SOLN
INTRAVENOUS | Status: DC | PRN
  Administered 2020-01-04: 1000 mg via INTRAVENOUS

## 2020-01-04 MED ORDER — DEXAMETHASONE SODIUM PHOSPHATE 10 MG/ML IJ SOLN
INTRAMUSCULAR | Status: DC | PRN
  Administered 2020-01-04: 10 mg via INTRAVENOUS

## 2020-01-04 MED ORDER — BUPIVACAINE HCL (PF) 0.25 % IJ SOLN
INTRAMUSCULAR | Status: DC | PRN
  Administered 2020-01-04: 8 mL

## 2020-01-04 MED ORDER — METOPROLOL TARTRATE 100 MG PO TABS
100.0000 mg | ORAL_TABLET | Freq: Two times a day (BID) | ORAL | Status: DC
  Administered 2020-01-04: 100 mg via ORAL
  Filled 2020-01-04 (×2): qty 1

## 2020-01-04 MED ORDER — FENTANYL CITRATE (PF) 100 MCG/2ML IJ SOLN
INTRAMUSCULAR | Status: DC | PRN
  Administered 2020-01-04 (×3): 50 ug via INTRAVENOUS

## 2020-01-04 MED ORDER — MEPERIDINE HCL 25 MG/ML IJ SOLN: 6.2500 mg | INTRAMUSCULAR | Status: DC | PRN

## 2020-01-04 MED ORDER — AMLODIPINE BESYLATE 10 MG PO TABS: 10.0000 mg | ORAL_TABLET | Freq: Every day | ORAL | Status: DC

## 2020-01-04 MED ORDER — ISOSORBIDE MONONITRATE ER 30 MG PO TB24
30.0000 mg | ORAL_TABLET | Freq: Every day | ORAL | Status: DC
  Administered 2020-01-04: 30 mg via ORAL
  Filled 2020-01-04: qty 1

## 2020-01-04 MED ORDER — OXYCODONE HCL 5 MG PO TABS: 5.0000 mg | ORAL_TABLET | ORAL | Status: DC | PRN

## 2020-01-04 MED ORDER — ATORVASTATIN CALCIUM 10 MG PO TABS
20.0000 mg | ORAL_TABLET | Freq: Every day | ORAL | Status: DC
  Administered 2020-01-04: 19:00:00 20 mg via ORAL
  Filled 2020-01-04: qty 2

## 2020-01-04 MED ORDER — PHENYLEPHRINE HCL (PRESSORS) 10 MG/ML IV SOLN
INTRAVENOUS | Status: DC | PRN
  Administered 2020-01-04 (×3): 40 ug via INTRAVENOUS

## 2020-01-04 MED ORDER — ONDANSETRON HCL 4 MG/2ML IJ SOLN
INTRAMUSCULAR | Status: DC | PRN
  Administered 2020-01-04: 4 mg via INTRAVENOUS

## 2020-01-04 MED ORDER — CARVEDILOL 12.5 MG PO TABS: 12.5000 mg | ORAL_TABLET | Freq: Two times a day (BID) | ORAL | Status: DC

## 2020-01-04 MED ORDER — SUGAMMADEX SODIUM 200 MG/2ML IV SOLN
INTRAVENOUS | Status: DC | PRN
  Administered 2020-01-04: 150 mg via INTRAVENOUS

## 2020-01-04 MED ORDER — THROMBIN (RECOMBINANT) 20000 UNITS EX SOLR
CUTANEOUS | Status: AC
  Filled 2020-01-04: qty 20000

## 2020-01-04 MED ORDER — PROMETHAZINE HCL 25 MG/ML IJ SOLN: 6.2500 mg | INTRAMUSCULAR | Status: DC | PRN

## 2020-01-04 MED ORDER — VANCOMYCIN HCL IN DEXTROSE 1-5 GM/200ML-% IV SOLN: 1000.0000 mg | INTRAVENOUS | Status: AC

## 2020-01-04 MED ORDER — CEFAZOLIN SODIUM-DEXTROSE 1-4 GM/50ML-% IV SOLN
1.0000 g | Freq: Three times a day (TID) | INTRAVENOUS | Status: AC
  Administered 2020-01-04 – 2020-01-05 (×2): 1 g via INTRAVENOUS
  Filled 2020-01-04 (×2): qty 50

## 2020-01-04 MED ORDER — VANCOMYCIN HCL IN DEXTROSE 1-5 GM/200ML-% IV SOLN
INTRAVENOUS | Status: AC
  Administered 2020-01-04: 1000 mg via INTRAVENOUS
  Filled 2020-01-04: qty 200

## 2020-01-04 MED ORDER — EPHEDRINE 5 MG/ML INJ
INTRAVENOUS | Status: AC
  Filled 2020-01-04: qty 10

## 2020-01-04 MED ORDER — MIDAZOLAM HCL 5 MG/5ML IJ SOLN
INTRAMUSCULAR | Status: DC | PRN
  Administered 2020-01-04: 2 mg via INTRAVENOUS

## 2020-01-04 MED ORDER — SODIUM CHLORIDE 0.9 % IV SOLN
INTRAVENOUS | Status: DC
  Administered 2020-01-04: 10 mL/h via INTRAVENOUS

## 2020-01-04 MED ORDER — 0.9 % SODIUM CHLORIDE (POUR BTL) OPTIME
TOPICAL | Status: DC | PRN
  Administered 2020-01-04 (×3): 1000 mL

## 2020-01-04 MED ORDER — SEVELAMER CARBONATE 800 MG PO TABS
800.0000 mg | ORAL_TABLET | Freq: Three times a day (TID) | ORAL | Status: DC
  Administered 2020-01-04: 800 mg via ORAL
  Filled 2020-01-04: qty 1

## 2020-01-04 MED ORDER — HEMOSTATIC AGENTS (NO CHARGE) OPTIME
TOPICAL | Status: DC | PRN
Start: 2020-01-04 — End: 2020-01-04
  Administered 2020-01-04: 2

## 2020-01-04 MED ORDER — SODIUM CHLORIDE 0.9% FLUSH: 3.0000 mL | INTRAVENOUS | Status: DC | PRN

## 2020-01-04 MED ORDER — SODIUM CHLORIDE 0.9 % IV SOLN: 250.0000 mL | INTRAVENOUS | Status: DC

## 2020-01-04 MED ORDER — LIDOCAINE 2% (20 MG/ML) 5 ML SYRINGE
INTRAMUSCULAR | Status: DC | PRN
  Administered 2020-01-04: 100 mg via INTRAVENOUS

## 2020-01-04 MED ORDER — ONDANSETRON HCL 4 MG PO TABS: 4.0000 mg | ORAL_TABLET | Freq: Four times a day (QID) | ORAL | Status: DC | PRN

## 2020-01-04 MED ORDER — ONDANSETRON HCL 4 MG/2ML IJ SOLN: 4.0000 mg | Freq: Four times a day (QID) | INTRAMUSCULAR | Status: DC | PRN

## 2020-01-04 SURGICAL SUPPLY — 76 items
BIT DRILL NEURO 2X3.1 SFT TUCH (MISCELLANEOUS) IMPLANT
BLADE CLIPPER SURG (BLADE) IMPLANT
BONE VIVIGEN FORMABLE 1.3CC (Bone Implant) ×3 IMPLANT
BUR EGG ELITE 4.0 (BURR) IMPLANT
BUR EGG ELITE 4.0MM (BURR)
BUR MATCHSTICK NEURO 3.0 LAGG (BURR) IMPLANT
CABLE BIPOLOR RESECTION CORD (MISCELLANEOUS) ×3 IMPLANT
CANISTER SUCT 3000ML PPV (MISCELLANEOUS) ×3 IMPLANT
CLOSURE STERI-STRIP 1/2X4 (GAUZE/BANDAGES/DRESSINGS) ×1
CLSR STERI-STRIP ANTIMIC 1/2X4 (GAUZE/BANDAGES/DRESSINGS) ×2 IMPLANT
COVER MAYO STAND STRL (DRAPES) ×9 IMPLANT
COVER SURGICAL LIGHT HANDLE (MISCELLANEOUS) ×6 IMPLANT
COVER WAND RF STERILE (DRAPES) ×3 IMPLANT
DEVICE ENDSKLTN IMPL 16X14X7X6 (Cage) IMPLANT
DRAPE C-ARM 42X72 X-RAY (DRAPES) ×3 IMPLANT
DRAPE MICROSCOPE LEICA 46X105 (MISCELLANEOUS) IMPLANT
DRAPE POUCH INSTRU U-SHP 10X18 (DRAPES) ×3 IMPLANT
DRAPE SURG 17X23 STRL (DRAPES) ×3 IMPLANT
DRAPE U-SHAPE 47X51 STRL (DRAPES) ×3 IMPLANT
DRILL NEURO 2X3.1 SOFT TOUCH (MISCELLANEOUS)
DRSG OPSITE POSTOP 3X4 (GAUZE/BANDAGES/DRESSINGS) ×3 IMPLANT
DRSG OPSITE POSTOP 4X6 (GAUZE/BANDAGES/DRESSINGS) ×2 IMPLANT
DURAPREP 26ML APPLICATOR (WOUND CARE) ×3 IMPLANT
ELECT COATED BLADE 2.86 ST (ELECTRODE) ×3 IMPLANT
ELECT PENCIL ROCKER SW 15FT (MISCELLANEOUS) ×3 IMPLANT
ELECT REM PT RETURN 9FT ADLT (ELECTROSURGICAL) ×3
ELECTRODE REM PT RTRN 9FT ADLT (ELECTROSURGICAL) ×1 IMPLANT
ENDOSKELETON IMPLANT 16X14X7X6 (Cage) ×6 IMPLANT
GLOVE BIO SURGEON STRL SZ 6.5 (GLOVE) ×4 IMPLANT
GLOVE BIO SURGEONS STRL SZ 6.5 (GLOVE) ×3
GLOVE BIOGEL PI IND STRL 6.5 (GLOVE) ×1 IMPLANT
GLOVE BIOGEL PI IND STRL 8.5 (GLOVE) ×1 IMPLANT
GLOVE BIOGEL PI INDICATOR 6.5 (GLOVE) ×2
GLOVE BIOGEL PI INDICATOR 8.5 (GLOVE) ×2
GLOVE SS BIOGEL STRL SZ 8.5 (GLOVE) ×1 IMPLANT
GLOVE SUPERSENSE BIOGEL SZ 8.5 (GLOVE) ×2
GOWN STRL REUS W/ TWL LRG LVL3 (GOWN DISPOSABLE) ×1 IMPLANT
GOWN STRL REUS W/TWL 2XL LVL3 (GOWN DISPOSABLE) ×3 IMPLANT
GOWN STRL REUS W/TWL LRG LVL3 (GOWN DISPOSABLE) ×3
GRAFT BNE MATRIX VG FRMBL SM 1 (Bone Implant) IMPLANT
KIT BASIN OR (CUSTOM PROCEDURE TRAY) ×3 IMPLANT
KIT TURNOVER KIT B (KITS) ×3 IMPLANT
NDL SPNL 18GX3.5 QUINCKE PK (NEEDLE) ×1 IMPLANT
NEEDLE HYPO 22GX1.5 SAFETY (NEEDLE) ×3 IMPLANT
NEEDLE SPNL 18GX3.5 QUINCKE PK (NEEDLE) ×3 IMPLANT
NS IRRIG 1000ML POUR BTL (IV SOLUTION) ×3 IMPLANT
PACK ORTHO CERVICAL (CUSTOM PROCEDURE TRAY) ×3 IMPLANT
PACK UNIVERSAL I (CUSTOM PROCEDURE TRAY) ×3 IMPLANT
PAD ARMBOARD 7.5X6 YLW CONV (MISCELLANEOUS) ×6 IMPLANT
PATTIES SURGICAL .25X.25 (GAUZE/BANDAGES/DRESSINGS) ×3 IMPLANT
PIN DISTRACTION 14 (PIN) ×4 IMPLANT
PLATE SKYLINE 2 LEVEL 34MM (Plate) ×2 IMPLANT
POSITIONER HEAD DONUT 9IN (MISCELLANEOUS) ×3 IMPLANT
PUTTY DBX 2.5CC (Putty) ×3 IMPLANT
PUTTY DBX 2.5CC DEPUY (Putty) IMPLANT
RESTRAINT LIMB HOLDER UNIV (RESTRAINTS) ×3 IMPLANT
RUBBERBAND STERILE (MISCELLANEOUS) IMPLANT
SCREW SKYLINE 14MM SD-VA (Screw) ×8 IMPLANT
SCREW SKYLINE 16MM (Screw) ×4 IMPLANT
SPONGE INTESTINAL PEANUT (DISPOSABLE) ×3 IMPLANT
SPONGE LAP 4X18 RFD (DISPOSABLE) ×6 IMPLANT
SPONGE SURGIFOAM ABS GEL 100 (HEMOSTASIS) ×3 IMPLANT
SURGIFLO W/THROMBIN 8M KIT (HEMOSTASIS) ×4 IMPLANT
SUT BONE WAX W31G (SUTURE) ×3 IMPLANT
SUT MNCRL AB 3-0 PS2 27 (SUTURE) ×3 IMPLANT
SUT SILK 2 0 (SUTURE)
SUT SILK 2-0 18XBRD TIE 12 (SUTURE) IMPLANT
SUT VIC AB 2-0 CT1 18 (SUTURE) ×3 IMPLANT
SYR BULB IRRIG 60ML STRL (SYRINGE) ×3 IMPLANT
SYR CONTROL 10ML LL (SYRINGE) ×3 IMPLANT
SYSTEM CHEST DRAIN TLS 7FR (DRAIN) ×2 IMPLANT
TAPE CLOTH 4X10 WHT NS (GAUZE/BANDAGES/DRESSINGS) ×3 IMPLANT
TAPE UMBILICAL COTTON 1/8X30 (MISCELLANEOUS) ×3 IMPLANT
TOWEL GREEN STERILE (TOWEL DISPOSABLE) ×3 IMPLANT
TOWEL GREEN STERILE FF (TOWEL DISPOSABLE) ×3 IMPLANT
WATER STERILE IRR 1000ML POUR (IV SOLUTION) ×3 IMPLANT

## 2020-01-04 NOTE — Transfer of Care (Signed)
Immediate Anesthesia Transfer of Care Note  Patient: Thomas Robinson  Procedure(s) Performed: ANTERIOR CERVICAL DECOMPRESSION/DISCECTOMY FUSION CERVICAL FIVE THROUGH SEVEN (N/A Spine Cervical)  Patient Location: PACU  Anesthesia Type:General  Level of Consciousness: awake, oriented and patient cooperative  Airway & Oxygen Therapy: Patient Spontanous Breathing and Patient connected to nasal cannula oxygen  Post-op Assessment: Report given to RN and Post -op Vital signs reviewed and stable  Post vital signs: Reviewed  Last Vitals:  Vitals Value Taken Time  BP 129/66 01/04/20 1450  Temp    Pulse 67 01/04/20 1453  Resp 14 01/04/20 1453  SpO2 100 % 01/04/20 1453  Vitals shown include unvalidated device data.  Last Pain:  Vitals:   01/04/20 0910  TempSrc: Tympanic  PainSc:       Patients Stated Pain Goal: 3 (24/46/95 0722)  Complications: No apparent anesthesia complications

## 2020-01-04 NOTE — Brief Op Note (Signed)
01/04/2020  2:44 PM  PATIENT:  Thomas Robinson  59 y.o. male  PRE-OPERATIVE DIAGNOSIS:  Cervical spondylotic radiculopathy  POST-OPERATIVE DIAGNOSIS:  Cervical spondylotic radiculopathy  PROCEDURE:  Procedure(s) with comments: ANTERIOR CERVICAL DECOMPRESSION/DISCECTOMY FUSION CERVICAL FIVE THROUGH SEVEN (N/A) - 3 hrs  SURGEON:  Surgeon(s) and Role:    Melina Schools, MD - Primary  PHYSICIAN ASSISTANT:   ASSISTANTS: Amanda Ward, PA   ANESTHESIA:   general  EBL:  50 mL   BLOOD ADMINISTERED:none  DRAINS: (1) TLS Drain(s) to suction in the neck   LOCAL MEDICATIONS USED:  MARCAINE     SPECIMEN:  No Specimen  DISPOSITION OF SPECIMEN:  N/A  COUNTS:  YES  TOURNIQUET:  * No tourniquets in log *  DICTATION: .Dragon Dictation  PLAN OF CARE: Admit for overnight observation  PATIENT DISPOSITION:  PACU - hemodynamically stable.

## 2020-01-04 NOTE — Discharge Instructions (Signed)
° ° ° ° ° °Today you will be discharged from the hospital.  The purpose of the following handout is to help guide you over the next 2 weeks.  First and foremost, be sure you have a follow up appointment with Dr. Rolanda Campa 2 weeks from the time of your surgery to have your sutures removed.  Please call [facility] [phone] to schedule or confirm this appointment.   ° ° ° °Brace °You do not have to wear the collar while lying in bed or sitting in a high-backed chair, eating, sleeping or showering.  Other than these instances, you must wear the brace.  You may NOT wear the collar while driving a vehicle (see driving restrictions below).  It is advisable that you wear the collar in public places or while traveling in a car as a passenger.  Dr. Eun Vermeer will discuss further use of the collar at your 2 week postop visit. ° °Wound Care °You may SHOWER 5 days from the date of surgery.  Shower directly over the steri-strips.  DO NOT scrub or submerge (bath tub, swimming pool, hot tub, etc.) the area.  Pat to dry following your shower.  There is no need for additional dressings other than the steri-strips.  Allow the steri-strips to fall off on their own.  Once the strips have fallen off, you may leave the area undressed.  DO NOT apply lotion/cream/ointment to the area.  The wound must remain dry at all times other than while showering.  Dr. Ceriah Kohler or his staff will remove your stiches at your first postop visit and give you additional instructions regarding wound care at that time.  ° °Activity °NO DRIVING FOR 2 WEEKS.  No lifting over 5 pounds (approximately a gallon of milk).  No bending, stooping, squatting or twisting.  No overhead activities.  We encourage you to walk (short distances and often throughout the day) as you can tolerate.  A good rule of thumb is to get up and move once or twice every hour.  You may go up and down stairs carefully.  As you continue to recover, Dr. Brandalyn Harting will address and  adjust restrictions to your activities until no further restrictions are needed.  However, until your first postop visit, when Dr. Sung Parodi can assess your recovery, you are to follow these instructions.  At the end of this document is a tentative outline of activities for up to 1 year.   ° ° ° ° °Medication °You will be discharged from the hospital with medication for pain, spasm, nausea and constipation.  You will be given enough medication to last until your first postop visit in 2 weeks.  Medications WILL NOT BE REFILLED EARLY; therefore, you are to take the medications only as directed.  If you have been given multiple prescriptions, please leave them with your pharmacy.  They can keep them on file for when you need them.  Medications that are lost or stolen WILL NOT be replaced.  We will address the need for continuing certain medications on an individual basis during your postop visit.  We ask that you avoid over the counter anti-inflammatory medications (Advil, Aleve, Motrin) for 3 months.   ° °What you can expect following neck surgery... °It is not uncommon to experience a sore throat or difficulty swallowing following neck surgery.  Cold liquids and soft foods are helpful in soothing this discomfort.  There is no specific diet that you are to follow after surgery, however, there are a   few things you should keep in mind to avoid unneeded discomfort.  Take small bites and eat slowly.  Chew your food thoroughly before swallowing.  ° °It is not uncommon to experience incisional soreness or pain in the back of the neck, shoulders or between the shoulder blades.  These symptoms will slowly begin to resolve as you continue to recover, however, they can last for a few weeks.   ° °It is not uncommon to experience INTERMITTENT arm pain following surgery.  This pain can mimic the arm pain you had prior to surgery.  As long as the pain resolves on its own and is not constant, there is no need to become alarmed.   ° °When To Call °If you experience fever >101F, loss of bowel or bladder control, painful swelling in the lower extremities, constant (unresolving) arm pain.  If you experience any of these symptoms, please call [facility] [phone]. ° °What's Next °As mentioned earlier, you will follow up with Dr. Margaret Staggs in 2 weeks.  At that time, we will likely remove your stitches and discuss additional aspects of your recovery.   ° ° ° ° ° ° ° ° ° ° ° ° ° ° ° ° °ACTIVITY GUIDELINES ANTERIOR CERVICAL DISECTOMY AND FUSION ° °Activity Discharge 2 weeks 6 weeks 3 months 6 months 1 year  °Shower 5 days        °Submerge the wound  no no yes     °Walking outdoors yes       °Lifting 5 lbs yes       °Climbing stairs yes       °Cooking yes       °Car rides (less than 30 minutes) yes       °Car rides (greater than 30 minutes) no varies yes     °Air travel no varies yes     °Short outings (church, visits, etc...) yes       °School no no yes     °Driving a car no no varies yes    °Light upper extremity exercises no no varies yes    °Stationary bike no no yes     °Swimming (no diving) no no no varies yes   °Vacuuming, laundry, mopping no no no varies yes   °Biking outdoors no no no no varies yes  °Light jogging no no no varies yes   °Low impact aerobics no no no varies yes   °Non-contact sports (tennis, golf) no no no varies yes   °Hunting (no tree climbing) no no no varies yes   °Dancing (non-gymnastics) no no no varies yes   °Down-hill skiing (experienced skier) no no no no yes   °Down-hill skiing (novice) no no no no yes   °Cross-country skiing no no no no yes   °Horseback riding (noncompetitive)  no no no no yes   °Horseback riding (competitive) no no no no varies yes  °Gardening/landscaping no no no varies yes   °House repairs no no no varies varies yes  °Lifting up to 50 lbs no no no no varies yes  ° ° ° ° ° ° ° ° °

## 2020-01-04 NOTE — Op Note (Signed)
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  Postoperative diagnosis: Same  Operative procedure: Anterior cervical discectomy and fusion C5-7  Implants: Depey anterior skyline plate: 34 mm length.  64mm screws into the body of C5 and 14 mm locking screws into the bodies of C6 and C7.  Titan/Medtronic intervertebral cage 7 mm medium lordotic.  Allograft: Vivogen and DBX  First assistant: Cleta Alberts, PA  Complications: None  Indications: Thomas Robinson is a very pleasant 59 year old gentleman with significant neck and radicular left arm pain.  Preoperative evaluation confirmed cervical spondylitic radiculopathy with numbness, dysesthesias, and radicular pain in the left C6 and C7 dermatome.  Despite appropriate conservative management his quality of life has continued to deteriorate, and as a result we elected to move forward with surgery.  All appropriate risks benefits and alternatives were discussed with the patient and consent was obtained.  Operative report: Patient was brought the operating room placed upon the operating room table.  After successful induction of general anesthesia and endotracheal ovation teds SCDs were applied and the anterior cervical spine was prepped and draped in a standard fashion.  Timeout was taken to confirm patient procedure and all other important data.  Using lateral fluoroscopy identified the midportion of the C6 vertebral body.  Transverse incision was marked on the skin and was infiltrated with quarter percent Marcaine with epinephrine.  Transverse skin incision was made and sharp dissection was carried out down to the level of the platysma.  I dissected the platysma in order to continue with my dissection.  This was a standard Smith-Robinson approach to the anterior cervical spine.  I continue to dissect along the medial border of the sternocleidomastoid into the deep cervical fascia.   The omohyoid was identified and released from its fascial sling.  I then continued into the deep cervical and prevertebral fascia until I could palpate the carotid sheath and sweep the esophagus and trachea to the right.  I now could visualize the anterior longitudinal ligament.  I placed a retractor to maintain the trachea and esophagus to the right and used Kitner dissectors to remove the remaining prevertebral fascia and expose the anterior aspect of the cervical spine.  I then placed a needle into the disc space at C5-6 and took an intraoperative x-ray to confirm that I was at the appropriate level.  Once this was confirmed I then used bipolar cautery and mobilized the longus coli muscle from the mid body of C5 to the inferior aspect of C7.  I then used a double-action Leksell rongeur to remove the overhanging osteophytes over the disc space at C5-6 and C6-7 once this was done I placed the Caspar retractor blades underneath the longus coli muscle and deflated the endotracheal cuff.  I expanded the retractor to the appropriate width and reinflated the endotracheal cuff.  Distraction pins were then placed into the body of C5 and C6.  Annulotomy was performed with a 15 blade scalpel and I used pituitary rongeurs curettes and Kerrison rongeurs to remove all the disc material as well as the overhanging osteophyte from the inferior aspect of the C5 vertebral body.  I distracted the intervertebral space with a lamina spreader and maintain that with the distraction pin set.  I then used the fine curettes to continue my discectomy.  I remove the cartilaginous endplate and all the disc material until I could expose the posterior osteophyte and annulus.  Using a fine nerve hook  I developed a plane underneath the annulus and used my 1 mm Kerrison rongeur to resect the posterior annulus and posterior longitudinal ligament.  This also allowed me to undercut the osteophyte at the level of the vertebral body and  uncovertebral joint.  At this point I could easily pass my nerve hook underneath the uncovertebral joint which I was able to confirm with fluoroscopy.  With an adequate discectomy and decompression and foraminotomy I move forward with the instrumentation.  I trialed intervertebral spacers and elected to use the size 7 medium lordotic spacer.  The cage was obtained and packed with the allograft and malleted to the appropriate depth.  Excellent overall positioning of the intervertebral spacer.  At this point I remove the distraction pin from the body of C5 and repositioned it at C7.  Once all the retractors were repositioned I could now visualize the C6-7 disc space.  Using the exact same technique I used at C5-6 I performed an annulotomy and discectomy at C6-7.  I again removed the posterior annulus and PLL which allowed me to resect the osteophyte from the posterior aspect of the vertebral bodies of C6 and C7; as well as the  uncovertebral joint to better decompress the exiting nerve.  Once I had adequate decompression I rasped the endplates and made sure had bleeding subchondral bone measured and placed the intervertebral cage.  X-rays at this time demonstrated satisfactory positioning of both cages.  I remove the distraction pins in nature the residual hole was sealed with bone wax and there was no bleeding.  I then contoured an anterior cervical plate and affixed it to the C5 vertebral body with 16 mm screws.  I then affixed it at C6-7 with 14 mm screws and finally at C6 with 14 mm screws.  All 6 screws were then advanced and had excellent purchase within the bone.  The locking mechanism was engaged to prevent backout of the screws.  At this point I irrigated the wound copiously normal saline and use proper electrocautery as well as FloSeal to obtain hemostasis.  Because of his underlying medical comorbidities I did elect to put a drain.  A drain was brought out through a separate stab incision.  After final  irrigation I checked to ensure the esophagus was not entrapped in plate and then returned the esophagus and trachea to midline.  I then closed the platysma with interrupted 2-0 Vicryl suture and the skin with 3-0 Monocryl.  Steri-Strips and a dry dressing were applied.  Patient was ultimately extubated transfer the PACU without incident.  The end of the case all needle sponge counts were correct.  There were no adverse intraoperative events.

## 2020-01-04 NOTE — H&P (Signed)
Addendum H&P  Patient presents today for ACDF C5-7.  He continues to have neck and primarily left radicular arm pain.  He has trace weakness in the C6 and C7 dermatome along with dysesthesias. Over the surgical procedure in great detail with the patient and I have included the risks benefits and alternatives.  All of his questions were encouraged and addressed.  Preoperative chest x-ray completed today compared to 12/30/2019:  FINDINGS: Borderline cardiac enlargement appears stable. Mild tortuosity and calcification of the thoracic aorta. The lungs are clear. No pleural effusions. No worrisome pulmonary lesions. The bony thorax is intact.  IMPRESSION: No acute cardiopulmonary findings.  Preoperative blood work completed today.  I have gone over the Chem-8, and CBC with the anesthesiologist.  At this point in general there improving and so we will move forward with surgery as indicated.

## 2020-01-04 NOTE — H&P (Signed)
History and Physical    Thomas Robinson XEN:[id number] DOB: Dec 17, 1960 DOA: 01/04/2020  PCP: Charlott Rakes, MD   Patient coming from: Home  I have personally briefly reviewed patient's old medical records in [place]  Chief Complaint: Lethargic  HPI: Thomas Robinson is a 59 y.o. male with medical history significant of ESRD on HD, HTN, Asthma, Stroke, HTN, severe cervical spondylitic radiculopathy, underwent anterior cervical discectomy and fusion C5-7 on the fascia.  Patient was successfully extubated in PACU, however remained lethargic.  Orthopedic surgeon concern about whether patient fit enough to go home tonight given his baseline conditions and today's surgery, and asking medical admission for overnight observation. PACU Course: Mild hypoxic and lethargic, complains about surgical site pain  Review of Systems: Unable to perform patient lethargic  Past Medical History:  Diagnosis Date  . Acute kidney injury superimposed on CKD ([place]) 05/24/2017  . Acute on chronic diastolic CHF (congestive heart failure) ([place]) 04/13/2018  . AKI (acute kidney injury) ([place]) 04/10/2015  . Arthritis   . Asthma   . Asthma, chronic, unspecified asthma severity, with acute exacerbation 10/23/2017  . Atypical chest pain 02/16/2018  . CAP (community acquired pneumonia) 10/23/2017  . Cataract    right - removed by surgery  . Cerebral infarction due to thrombosis of cerebral artery ([place])   . Cerebrovascular accident (CVA) ([place])   . Chronic kidney disease   . CKD (chronic kidney disease), stage V ([place]) 05/24/2017  . Cough    chronic cough  . Dyspnea    "all the time" per patient at PAT 12/30/19  RA sats 87%, no oxygen use  . ESRD (end stage renal disease) on dialysis ([place]) 02/16/2018   Dialysis  T Thus Sat  - [facility]   . Essential hypertension 04/09/2015  . GERD (gastroesophageal reflux disease) 06/04/2019  . GI bleeding 05/24/2017  . History of completed stroke 2018  . Hyperlipidemia    . Hypertension   . Hypertensive urgency 05/24/2017  . Kidney failure   . Oxygen deficiency 12/30/2019   O2 sats on RA 87% at PAT appt   . Respiratory failure, acute ([place]) 10/23/2017  . Restless leg syndrome   . Rib fractures 06/2019   Right  . Stroke ([place]) 2018  . Symptomatic anemia 05/24/2017  . Thoracic ascending aortic aneurysm ([facility])    4.5 cm 06/04/19 CT    Past Surgical History:  Procedure Laterality Date  . AV FISTULA PLACEMENT Left 05/28/2017   Procedure: LEFT ARM ARTERIOVENOUS (AV) FISTULA CREATION;  Surgeon: Conrad Newell, MD;  Location: [facility];  Service: Vascular;  Laterality: Left;  . EYE SURGERY Right 06/02/2019   Cataract removed  . FRACTURE SURGERY    . IR FLUORO GUIDE CV LINE RIGHT  05/25/2017  . IR US GUIDE VASC ACCESS RIGHT  05/25/2017  . REVISON OF ARTERIOVENOUS FISTULA Left 07/18/2019   Procedure: REVISION PLICATION OF RADIOCEPHALIC ARTERIOVENOUS FISTULA LEFT ARM;  Surgeon: Angelia Mould, MD;  Location: [facility];  Service: Vascular;  Laterality: Left;  . RINOPLASTY       reports that he has never smoked. He has never used smokeless tobacco. He reports previous alcohol use. He reports that he does not use drugs.  No Known Allergies  Family History  Problem Relation Age of Onset  . Cancer Mother      Prior to Admission medications   Medication Sig Start Date End Date Taking? Authorizing Provider  amLODipine (NORVASC) 10 MG tablet Take 1  tablet (10 mg total) by mouth daily. 05/18/19  Yes Martyn Malay, MD  aspirin EC 81 MG EC tablet Take 1 tablet (81 mg total) by mouth daily. 04/16/18  Yes Dana Allan I, MD  hydrALAZINE (APRESOLINE) 100 MG tablet Take 1 tablet (100 mg total) by mouth every 8 (eight) hours. Patient taking differently: Take 100 mg by mouth 2 (two) times daily.  05/18/19  Yes Martyn Malay, MD  metoprolol tartrate (LOPRESSOR) 100 MG tablet Take 100 mg by mouth 2 (two) times daily.    Yes [provider]   oxyCODONE-acetaminophen (PERCOCET/ROXICET) 5-325 MG tablet Take 1 tablet by mouth at bedtime.  12/15/19  Yes [provider]  sevelamer (RENAGEL) 800 MG tablet Take 800 mg by mouth daily.  01/25/18  Yes [provider]  albuterol (PROVENTIL HFA;VENTOLIN HFA) 108 (90 BASE) MCG/ACT inhaler Inhale 1-2 puffs into the lungs every 6 (six) hours as needed for wheezing or shortness of breath. Patient not taking: Reported on 12/30/2019 04/12/15   [name], MD  atorvastatin (LIPITOR) 20 MG tablet Take 1 tablet (20 mg total) by mouth daily. 06/10/19 12/30/19  Tobb, Kardie, DO  budesonide-formoterol (SYMBICORT) 80-4.5 MCG/ACT inhaler Inhale 2 puffs into the lungs 2 (two) times daily. Patient not taking: Reported on 12/30/2019 05/18/19   Martyn Malay, MD  carvedilol (COREG) 12.5 MG tablet Take 1 tablet (12.5 mg total) by mouth 2 (two) times daily with a meal. 05/18/19   Martyn Malay, MD  isosorbide mononitrate (IMDUR) 30 MG 24 hr tablet Take 1 tablet (30 mg total) by mouth daily. 07/15/19 12/30/19  Tobb, Kardie, DO  oxyCODONE (ROXICODONE) 5 MG immediate release tablet Take 1 tablet (5 mg total) by mouth every 4 (four) hours as needed. Patient not taking: Reported on 12/30/2019 07/18/19   Angelia Mould, MD    Physical Exam: Vitals:   01/04/20 1605 01/04/20 1620 01/04/20 1720 01/04/20 1742  BP: 116/66 112/69 118/60   Pulse: (!) 52 60 (!) 59   Resp: 11 14 12    Temp:    98.2 F (36.8 C)  TempSrc:      SpO2: 97% 92% 94%   Weight:      Height:        Constitutional: NAD, calm, comfortable Vitals:   01/04/20 1605 01/04/20 1620 01/04/20 1720 01/04/20 1742  BP: 116/66 112/69 118/60   Pulse: (!) 52 60 (!) 59   Resp: 11 14 12    Temp:    98.2 F (36.8 C)  TempSrc:      SpO2: 97% 92% 94%   Weight:      Height:       Eyes: PERRL, lids and conjunctivae normal ENMT: Mucous membranes are moist. Posterior pharynx clear of any exudate or lesions.Normal dentition.  Neck:  drains about 10 cc sanguine colored fluid Respiratory: clear to auscultation bilaterally, no wheezing, no crackles. Normal respiratory effort. No accessory muscle use.  Cardiovascular: Regular rate and rhythm, no murmurs / rubs / gallops. No extremity edema. 2+ pedal pulses. No carotid bruits.  Abdomen: no tenderness, no masses palpated. No hepatosplenomegaly. Bowel sounds positive.  Musculoskeletal: no clubbing / cyanosis. No joint deformity upper and lower extremities. Good ROM, no contractures. Normal muscle tone.  Skin: no rashes, lesions, ulcers. No induration Neurologic: Moving all limbs  Psychiatric:  Arousable   Labs on Admission: I have personally reviewed following labs and imaging studies  CBC: Recent Labs  Lab 12/30/19 1340 01/04/20 0843 01/04/20 1008  WBC 6.3 5.1  --   HGB 9.9* 10.6* 9.5*  HCT 31.1* 31.9* 28.0*  MCV 106.5* 104.9*  --   PLT 151 189  --    Basic Metabolic Panel: Recent Labs  Lab 12/30/19 1340 01/04/20 0843 01/04/20 1008  NA 135 140 137  K 5.5* 3.8 3.6  CL 95* 94* 97*  CO2 20* 24  --   GLUCOSE 102* 98 115*  BUN 80* 42* 40*  CREATININE 11.86* 8.07* 7.70*  CALCIUM 9.5 10.1  --    GFR: Estimated Creatinine Clearance: 9 mL/min (A) (by C-G formula based on SCr of 7.7 mg/dL (H)). Liver Function Tests: No results for input(s): AST, ALT, ALKPHOS, BILITOT, PROT, ALBUMIN in the last 168 hours. No results for input(s): LIPASE, AMYLASE in the last 168 hours. No results for input(s): AMMONIA in the last 168 hours. Coagulation Profile: Recent Labs  Lab 01/04/20 0843  INR 1.1   Cardiac Enzymes: No results for input(s): CKTOTAL, CKMB, CKMBINDEX, TROPONINI in the last 168 hours. BNP (last 3 results) No results for input(s): PROBNP in the last 8760 hours. HbA1C: Recent Labs    01/04/20 1749  HGBA1C 4.9   CBG: No results for input(s): GLUCAP in the last 168 hours. Lipid Profile: No results for input(s): CHOL, HDL, LDLCALC, TRIG, CHOLHDL,  LDLDIRECT in the last 72 hours. Thyroid Function Tests: No results for input(s): TSH, T4TOTAL, FREET4, T3FREE, THYROIDAB in the last 72 hours. Anemia Panel: No results for input(s): VITAMINB12, FOLATE, FERRITIN, TIBC, IRON, RETICCTPCT in the last 72 hours. Urine analysis:    Component Value Date/Time   COLORURINE YELLOW 05/24/2017 1729   APPEARANCEUR HAZY (A) 05/24/2017 1729   LABSPEC 1.011 05/24/2017 1729   PHURINE 5.0 05/24/2017 1729   GLUCOSEU 50 (A) 05/24/2017 1729   HGBUR SMALL (A) 05/24/2017 1729   BILIRUBINUR NEGATIVE 05/24/2017 1729   KETONESUR NEGATIVE 05/24/2017 1729   PROTEINUR 100 (A) 05/24/2017 1729   UROBILINOGEN 0.2 04/09/2015 1527   NITRITE NEGATIVE 05/24/2017 1729   LEUKOCYTESUR TRACE (A) 05/24/2017 1729    Radiological Exams on Admission: DG Cervical Spine 2-3 Views  Result Date: 01/04/2020 CLINICAL DATA:  Anterior cervical decompression/discectomy. Fusion C5 through C7. EXAM: CERVICAL SPINE - 2-3 VIEW; DG C-ARM 1-60 MIN COMPARISON:  None. FINDINGS: Three fluoroscopic spot views obtained in the operating room in frontal and lateral projections. Anterior fusion with interbody spacers at C5 through C7. Fluoroscopy time reported 2 minutes 12 seconds. IMPRESSION: Fluoroscopic spot views after C5-C7 anterior fusion. Electronically Signed   By: Keith Rake M.D.   On: 01/04/2020 14:30   DG Chest Port 1 View  Result Date: 01/04/2020 CLINICAL DATA:  Preop for spinal surgery. EXAM: PORTABLE CHEST 1 VIEW COMPARISON:  12/30/2019 FINDINGS: Borderline cardiac enlargement appears stable. Mild tortuosity and calcification of the thoracic aorta. The lungs are clear. No pleural effusions. No worrisome pulmonary lesions. The bony thorax is intact. IMPRESSION: No acute cardiopulmonary findings. Electronically Signed   By: Marijo Sanes M.D.   On: 01/04/2020 10:04   DG C-Arm 1-60 Min  Result Date: 01/04/2020 CLINICAL DATA:  Anterior cervical decompression/discectomy. Fusion C5 through  C7. EXAM: CERVICAL SPINE - 2-3 VIEW; DG C-ARM 1-60 MIN COMPARISON:  None. FINDINGS: Three fluoroscopic spot views obtained in the operating room in frontal and lateral projections. Anterior fusion with interbody spacers at C5 through C7. Fluoroscopy time reported 2 minutes 12 seconds. IMPRESSION: Fluoroscopic spot views after C5-C7 anterior fusion. Electronically Signed   By: Aurther Loft.D.  On: 01/04/2020 14:30    EKG: ordered  Assessment/Plan Active Problems:   Cervical disc herniation   Tachycardia  AMS Looks like still under influence from anesthesia medications, probably as low metabolizer from chronic kidney disease, will placed under observation overnight, avoid oversedation and narcotics. Case was discussed with orthopedic surgeon Likely can be discharged home in the next 24 hours and go to outpatient dialysis  ESRD No significant electrolyte imbalance, clinically looks euvolemic, no significant uremia, plan to discharge home in a.m. and go to outpatient dialysis in p.m. tomorrow  Paroxysmal SVT question of? Reported by anesthesia nurse, no significant arrhythmia on Monitoring in PACU, ordered 2 sets of troponin and repeat EKG  Resistant HTN, now borderline hypotension  Resume home meds in steps, Metoprolol for tonight and Amlodipine and Hydralazine start tomorrow   DVT prophylaxis: Heparin subcu Code Status: Full code Family Communication: None at bedside Disposition Plan: Overnight observation, likely can be discharged in a.m. Consults called: None Admission status: Telemetry observation   Lequita Halt MD [facility] Pager [phone]    01/04/2020, 6:57 PM

## 2020-01-04 NOTE — Anesthesia Postprocedure Evaluation (Signed)
Anesthesia Post Note  Patient: Thomas Robinson  Procedure(s) Performed: ANTERIOR CERVICAL DECOMPRESSION/DISCECTOMY FUSION CERVICAL FIVE THROUGH SEVEN (N/A Spine Cervical)     Patient location during evaluation: PACU Anesthesia Type: General Level of consciousness: awake and alert Pain management: pain level controlled Vital Signs Assessment: post-procedure vital signs reviewed and stable Respiratory status: spontaneous breathing, nonlabored ventilation and respiratory function stable Cardiovascular status: blood pressure returned to baseline and stable Postop Assessment: no apparent nausea or vomiting Anesthetic complications: no    Last Vitals:  Vitals:   01/04/20 1535 01/04/20 1550  BP: (!) 113/59 117/64  Pulse: 60 63  Resp: 11 14  Temp:    SpO2: 96% 98%    Last Pain:  Vitals:   01/04/20 1550  TempSrc:   PainSc: 4                  Trooper Olander,W. EDMOND

## 2020-01-04 NOTE — Anesthesia Procedure Notes (Signed)
Procedure Name: Intubation Date/Time: 01/04/2020 10:51 AM Performed by: Jenne Campus, CRNA Pre-anesthesia Checklist: Patient identified, Emergency Drugs available, Suction available and Patient being monitored Patient Re-evaluated:Patient Re-evaluated prior to induction Oxygen Delivery Method: Circle System Utilized Preoxygenation: Pre-oxygenation with 100% oxygen Induction Type: IV induction Ventilation: Mask ventilation without difficulty Laryngoscope Size: Glidescope and 4 Grade View: Grade I Tube type: Oral Tube size: 7.5 mm Number of attempts: 1 Airway Equipment and Method: Stylet and Oral airway Placement Confirmation: ETT inserted through vocal cords under direct vision,  positive ETCO2 and breath sounds checked- equal and bilateral Secured at: 21 cm Tube secured with: Tape Dental Injury: Teeth and Oropharynx as per pre-operative assessment

## 2020-01-05 DIAGNOSIS — M4722 Other spondylosis with radiculopathy, cervical region: Secondary | ICD-10-CM | POA: Diagnosis not present

## 2020-01-05 MED ORDER — ONDANSETRON HCL 4 MG PO TABS: 4.0000 mg | ORAL_TABLET | Freq: Three times a day (TID) | ORAL | 0 refills | Status: DC | PRN

## 2020-01-05 MED ORDER — METHOCARBAMOL 500 MG PO TABS: 500.0000 mg | ORAL_TABLET | Freq: Three times a day (TID) | ORAL | 0 refills | Status: AC | PRN

## 2020-01-05 MED ORDER — OXYCODONE-ACETAMINOPHEN 10-325 MG PO TABS: 1.0000 | ORAL_TABLET | Freq: Four times a day (QID) | ORAL | 0 refills | Status: AC | PRN

## 2020-01-05 MED FILL — Thrombin (Recombinant) For Soln 20000 Unit: CUTANEOUS | Qty: 1 | Status: AC

## 2020-01-05 NOTE — Evaluation (Signed)
Occupational Therapy Evaluation Patient Details Name: Thomas Robinson MRN: [id number] DOB: 1961-02-01 Today's Date: 01/05/2020    History of Present Illness 59 yo male s/p ACDF C5-7. PMH including HTN, HLD, chronic diastolic CHF, CVA, ESRD (on HD) GI bleed (2018), anemia, asthma, and fall on 06/2019 resulting in 8-9th rib fxs with pulmonary contusion requiring 3L O2 and left AMA.    Clinical Impression   PTA, pt was living with his friend and was independent. Currently, pt performing ADLs and functional mobility at [place] I level. Provided education and handout on cervical precautions, collar management, bed mobility, UB ADLs, LB ADLs, and toileting; pt demonstrated understanding. Answered all pt questions. Recommend dc home once medically stable per physician. All acute OT needs met and will sign off. Thank you.    Follow Up Recommendations  No OT follow up;Supervision - Intermittent    Equipment Recommendations  None recommended by OT    Recommendations for Other Services       Precautions / Restrictions Precautions Precautions: Cervical Precaution Booklet Issued: Yes (comment) Precaution Comments: Reviewed cervical precautions and handout Required Braces or Orthoses: Cervical Brace Cervical Brace: Hard collar;At all times      Mobility Bed Mobility               General bed mobility comments: Educating pt on log roll for bed mobility  Transfers Overall transfer level: Modified independent                    Balance Overall balance assessment: No apparent balance deficits (not formally assessed)                                         ADL either performed or assessed with clinical judgement   ADL Overall ADL's : Needs assistance/impaired                                       General ADL Comments: Educating pt on compensatory techniques for grooming, brace management, showing, LB ADLs, UB ADLs, restrictions, bed  mobility, and toileting. Pt verbalizing understanding     Vision         Perception     Praxis      Pertinent Vitals/Pain Pain Assessment: Faces Faces Pain Scale: No hurt Pain Intervention(s): Monitored during session     Hand Dominance Right   Extremity/Trunk Assessment Upper Extremity Assessment Upper Extremity Assessment: Overall WFL for tasks assessed   Lower Extremity Assessment Lower Extremity Assessment: Overall WFL for tasks assessed   Cervical / Trunk Assessment Cervical / Trunk Assessment: Other exceptions Cervical / Trunk Exceptions: cervical sx   Communication Communication Communication: No difficulties   Cognition Arousal/Alertness: Awake/alert Behavior During Therapy: WFL for tasks assessed/performed Overall Cognitive Status: Within Functional Limits for tasks assessed                                 General Comments: Very motivated to leave and repeating that he has a dialysis appointment   General Comments  Educating pt on car transfers    Exercises     Shoulder Instructions      Home Living Family/patient expects to be discharged to:: Private residence Living Arrangements: Non-relatives/Friends Available Help at Discharge: Family;Available  24 hours/day                                    Prior Functioning/Environment Level of Independence: Independent                 OT Problem List: Decreased activity tolerance;Decreased knowledge of precautions;Pain      OT Treatment/Interventions:      OT Goals(Current goals can be found in the care plan section) Acute Rehab OT Goals Patient Stated Goal: Leave to make my dialysis appoitment OT Goal Formulation: All assessment and education complete, DC therapy  OT Frequency:     Barriers to D/C:            Co-evaluation              AM-PAC OT "6 Clicks" Daily Activity     Outcome Measure Help from another person eating meals?: None Help from  another person taking care of personal grooming?: None Help from another person toileting, which includes using toliet, bedpan, or urinal?: None Help from another person bathing (including washing, rinsing, drying)?: None Help from another person to put on and taking off regular upper body clothing?: None Help from another person to put on and taking off regular lower body clothing?: None 6 Click Score: 24   End of Session Equipment Utilized During Treatment: Cervical collar Nurse Communication: Mobility status  Activity Tolerance: Patient tolerated treatment well Patient left: Other (comment)(In car with RN to dc)  OT Visit Diagnosis: Unsteadiness on feet (R26.81);Other abnormalities of gait and mobility (R26.89);Muscle weakness (generalized) (M62.81);Pain Pain - part of body: (Neck)                Time: 3903-0092 OT Time Calculation (min): 12 min Charges:  OT General Charges $OT Visit: 1 Visit OT Evaluation $OT Eval Low Complexity: 1 Low  Jacey Pelc MSOT, OTR/L Acute Rehab Pager: [phone] Office: [place] 01/05/2020, 8:58 AM

## 2020-01-05 NOTE — Progress Notes (Signed)
    Subjective: Procedure(s) (LRB): ANTERIOR CERVICAL DECOMPRESSION/DISCECTOMY FUSION CERVICAL FIVE THROUGH SEVEN (N/A) 1 Day Post-Op  Patient reports pain as 1 on 0-10 scale.  Reports decreased arm pain reports incisional neck pain   Positive void Negative bowel movement Positive flatus Negative chest pain or shortness of breath  Objective: Vital signs in last 24 hours: Temp:  [96.2 F (35.7 C)-98.5 F (36.9 C)] 98.5 F (36.9 C) (05/06 0425) Pulse Rate:  [50-76] 50 (05/06 0425) Resp:  [11-19] 18 (05/06 0425) BP: (112-153)/(57-69) 130/57 (05/06 0425) SpO2:  [90 %-98 %] 96 % (05/06 0425) Weight:  [62.1 kg] 62.1 kg (05/05 0910)  Intake/Output from previous day: 05/05 0701 - 05/06 0700 In: 820 [P.O.:120; I.V.:350; IV Piggyback:350] Out: 50 [Blood:50]  Labs: Recent Labs    01/04/20 0843 01/04/20 1008  WBC 5.1  --   RBC 3.04*  --   HCT 31.9* 28.0*  PLT 189  --    Recent Labs    01/04/20 0843 01/04/20 1008  NA 140 137  K 3.8 3.6  CL 94* 97*  CO2 24  --   BUN 42* 40*  CREATININE 8.07* 7.70*  GLUCOSE 98 115*  CALCIUM 10.1  --    Recent Labs    01/04/20 0843  INR 1.1    Physical Exam: Neurologically intact ABD soft Intact pulses distally Incision: dressing C/D/I Compartment soft negative nerve root tension signs. Body mass index is 21.14 kg/m.  Assessment/Plan: Patient stable  xrays n/a Mobilization with physical therapy Encourage incentive spirometry Continue care  Patient doing well No issues on tele last night No complaints this AM Plan on d/c this AM - patient will goto dialysis today at 10:45 AM. F/U with me in the office in 2 weeks  Melina Schools, MD [facility] [phone]

## 2020-01-05 NOTE — Progress Notes (Signed)
Pt requesting to leave. RN and OT went over pt discharge paperwork. IV was removed. VS were stable. Pt was A&O X4. Pt was walked down to the front entrance with RN and OT. Pt had all belongings on him and discharge paperwork and prescriptions were given to the patient.

## 2020-01-06 ENCOUNTER — Encounter: Payer: Self-pay | Admitting: *Deleted

## 2020-01-06 ENCOUNTER — Telehealth: Payer: Self-pay | Admitting: Nurse Practitioner

## 2020-01-06 ENCOUNTER — Telehealth: Payer: Self-pay

## 2020-01-06 NOTE — Discharge Summary (Signed)
Patient ID: Thomas Robinson MRN: [id number] DOB/AGE: 04-27-1961 59 y.o.  Admit date: 01/04/2020 Discharge date: 01/06/2020  Admission Diagnoses:  Active Problems:   Cervical disc herniation   Tachycardia   SOB (shortness of breath)   Discharge Diagnoses:  Active Problems:   Cervical disc herniation   Tachycardia   SOB (shortness of breath)  status post Procedure(s): ANTERIOR CERVICAL DECOMPRESSION/DISCECTOMY FUSION CERVICAL FIVE THROUGH SEVEN  Past Medical History:  Diagnosis Date  . Acute kidney injury superimposed on CKD ([place]) 05/24/2017  . Acute on chronic diastolic CHF (congestive heart failure) ([place]) 04/13/2018  . AKI (acute kidney injury) ([place]) 04/10/2015  . Arthritis   . Asthma   . Asthma, chronic, unspecified asthma severity, with acute exacerbation 10/23/2017  . Atypical chest pain 02/16/2018  . CAP (community acquired pneumonia) 10/23/2017  . Cataract    right - removed by surgery  . Cerebral infarction due to thrombosis of cerebral artery ([place])   . Cerebrovascular accident (CVA) ([place])   . Chronic kidney disease   . CKD (chronic kidney disease), stage V ([place]) 05/24/2017  . Cough    chronic cough  . Dyspnea    "all the time" per patient at PAT 12/30/19  RA sats 87%, no oxygen use  . ESRD (end stage renal disease) on dialysis ([place]) 02/16/2018   Dialysis  T Thus Sat  - [facility]   . Essential hypertension 04/09/2015  . GERD (gastroesophageal reflux disease) 06/04/2019  . GI bleeding 05/24/2017  . History of completed stroke 2018  . Hyperlipidemia   . Hypertension   . Hypertensive urgency 05/24/2017  . Kidney failure   . Oxygen deficiency 12/30/2019   O2 sats on RA 87% at PAT appt   . Respiratory failure, acute ([place]) 10/23/2017  . Restless leg syndrome   . Rib fractures 06/2019   Right  . Stroke ([place]) 2018  . Symptomatic anemia 05/24/2017  . Thoracic ascending aortic aneurysm ([place])    4.5 cm 06/04/19 CT    Surgeries: Procedure(s): ANTERIOR CERVICAL  DECOMPRESSION/DISCECTOMY FUSION CERVICAL FIVE THROUGH SEVEN on 01/04/2020   Consultants: Internal medicine   Discharged Condition: Improved  Hospital Course: Thomas Robinson is an 59 y.o. male who was admitted 01/04/2020 for operative treatment of Cervical spondylotic radiculopathy. Patient failed conservative treatments (please see the history and physical for the specifics) and had severe unremitting pain that affects sleep, daily activities and work/hobbies. After pre-op clearance, the patient was taken to the operating room on 01/04/2020 and underwent  Procedure(s): ANTERIOR CERVICAL DECOMPRESSION/DISCECTOMY FUSION [place].    Patient was given perioperative antibiotics:  Anti-infectives (From admission, onward)   Start     Dose/Rate Route Frequency Ordered Stop   01/04/20 1830  ceFAZolin (ANCEF) IVPB 1 g/50 mL premix     1 g 100 mL/hr over 30 Minutes Intravenous Every 8 hours 01/04/20 1616 01/05/20 0350   01/04/20 0837  vancomycin (VANCOCIN) IVPB 1000 mg/200 mL premix     1,000 mg 200 mL/hr over 60 Minutes Intravenous 60 min pre-op 01/04/20 0837 01/04/20 1008       Patient was given sequential compression devices and early ambulation to prevent DVT.   Patient benefited maximally from hospital stay and there were no complications. At the time of discharge, the patient was urinating/moving their bowels without difficulty, tolerating a regular diet, pain is controlled with oral pain medications and they have been cleared by PT/OT.   Recent vital signs: No data found.  Recent laboratory studies:  Recent Labs    01/04/20 0843 01/04/20 1008  WBC 5.1  --   HGB 10.6* 9.5*  HCT 31.9* 28.0*  PLT 189  --   NA 140 137  K 3.8 3.6  CL 94* 97*  CO2 24  --   BUN 42* 40*  CREATININE 8.07* 7.70*  GLUCOSE 98 115*  INR 1.1  --   CALCIUM 10.1  --      Discharge Medications:   Allergies as of 01/05/2020   No Known Allergies     Medication List    STOP taking these  medications   albuterol 108 (90 Base) MCG/ACT inhaler Commonly known as: VENTOLIN HFA   aspirin 81 MG EC tablet   budesonide-formoterol 80-4.5 MCG/ACT inhaler Commonly known as: Symbicort   oxyCODONE 5 MG immediate release tablet Commonly known as: Roxicodone   oxyCODONE-acetaminophen 5-325 MG tablet Commonly known as: PERCOCET/ROXICET Replaced by: oxyCODONE-acetaminophen 10-325 MG tablet     TAKE these medications   amLODipine 10 MG tablet Commonly known as: NORVASC Take 1 tablet (10 mg total) by mouth daily.   atorvastatin 20 MG tablet Commonly known as: LIPITOR Take 1 tablet (20 mg total) by mouth daily.   carvedilol 12.5 MG tablet Commonly known as: COREG Take 1 tablet (12.5 mg total) by mouth 2 (two) times daily with a meal.   hydrALAZINE 100 MG tablet Commonly known as: APRESOLINE Take 1 tablet (100 mg total) by mouth every 8 (eight) hours. What changed: when to take this   isosorbide mononitrate 30 MG 24 hr tablet Commonly known as: IMDUR Take 1 tablet (30 mg total) by mouth daily.   methocarbamol 500 MG tablet Commonly known as: Robaxin Take 1 tablet (500 mg total) by mouth every 8 (eight) hours as needed for up to 5 days for muscle spasms.   metoprolol tartrate 100 MG tablet Commonly known as: LOPRESSOR Take 100 mg by mouth 2 (two) times daily.   ondansetron 4 MG tablet Commonly known as: Zofran Take 1 tablet (4 mg total) by mouth every 8 (eight) hours as needed for nausea or vomiting.   oxyCODONE-acetaminophen 10-325 MG tablet Commonly known as: Percocet Take 1 tablet by mouth every 6 (six) hours as needed for up to 5 days for pain. Replaces: oxyCODONE-acetaminophen 5-325 MG tablet   sevelamer 800 MG tablet Commonly known as: RENAGEL Take 800 mg by mouth daily.       Diagnostic Studies: DG Chest 2 View  Result Date: 12/30/2019 CLINICAL DATA:  Preoperative evaluation EXAM: CHEST - 2 VIEW COMPARISON:  06/05/2019 FINDINGS: Minimal enlargement of  cardiac silhouette with slight pulmonary vascular congestion. Mediastinal contours normal. Slight chronic prominence of interstitial markings, unchanged. No definite acute infiltrate, pleural effusion or pneumothorax. Scattered endplate spur formation thoracic spine. IMPRESSION: Enlargement of cardiac silhouette with pulmonary vascular congestion. Slight chronic accentuation of interstitial markings without definite acute abnormalities Electronically Signed   By: Lavonia Dana M.D.   On: 12/30/2019 15:43   DG Cervical Spine 2-3 Views  Result Date: 01/04/2020 CLINICAL DATA:  Anterior cervical decompression/discectomy. Fusion C5 through C7. EXAM: CERVICAL SPINE - 2-3 VIEW; DG C-ARM 1-60 MIN COMPARISON:  None. FINDINGS: Three fluoroscopic spot views obtained in the operating room in frontal and lateral projections. Anterior fusion with interbody spacers at C5 through C7. Fluoroscopy time reported 2 minutes 12 seconds. IMPRESSION: Fluoroscopic spot views after C5-C7 anterior fusion. Electronically Signed   By: Keith Rake M.D.   On: 01/04/2020 14:30  DG Chest Port 1 View  Result Date: 01/04/2020 CLINICAL DATA:  Preop for spinal surgery. EXAM: PORTABLE CHEST 1 VIEW COMPARISON:  12/30/2019 FINDINGS: Borderline cardiac enlargement appears stable. Mild tortuosity and calcification of the thoracic aorta. The lungs are clear. No pleural effusions. No worrisome pulmonary lesions. The bony thorax is intact. IMPRESSION: No acute cardiopulmonary findings. Electronically Signed   By: Marijo Sanes M.D.   On: 01/04/2020 10:04   DG Chest Port 1 View  Result Date: 12/30/2019 CLINICAL DATA:  Hypoxia EXAM: PORTABLE CHEST 1 VIEW COMPARISON:  Portable exam 1531 hours compared to earlier two-view exam of 1335 hours and to 06/05/2019 FINDINGS: Enlargement of cardiac silhouette with pulmonary vascular congestion. Mediastinal contours normal. Slight accentuation of interstitial markings, chronic. No definite pulmonary  infiltrate, pleural effusion or pneumothorax. Osseous structures unremarkable. IMPRESSION: Enlargement of cardiac silhouette with pulmonary vascular congestion. Slight chronic accentuation of interstitial markings without definite acute infiltrate. Electronically Signed   By: Lavonia Dana M.D.   On: 12/30/2019 15:41   DG C-Arm 1-60 Min  Result Date: 01/04/2020 CLINICAL DATA:  Anterior cervical decompression/discectomy. Fusion C5 through C7. EXAM: CERVICAL SPINE - 2-3 VIEW; DG C-ARM 1-60 MIN COMPARISON:  None. FINDINGS: Three fluoroscopic spot views obtained in the operating room in frontal and lateral projections. Anterior fusion with interbody spacers at C5 through C7. Fluoroscopy time reported 2 minutes 12 seconds. IMPRESSION: Fluoroscopic spot views after C5-C7 anterior fusion. Electronically Signed   By: Keith Rake M.D.   On: 01/04/2020 14:30    Discharge Instructions    Diet - low sodium heart healthy   Complete by: As directed    Discharge instructions   Complete by: As directed    Follow  Up with Orthopedics as recommended in 2 weeks.   Incentive spirometry RT   Complete by: As directed       Follow-up Information    Melina Schools, MD. Schedule an appointment as soon as possible for a visit in 2 weeks.   Specialty: Orthopedic Surgery Why: If symptoms worsen, For suture removal, For wound re-check Contact information: [address]  [place] [address] [phone]        Charlott Rakes, MD. Schedule an appointment as soon as possible for a visit in 1 week(s).   Specialty: Family Medicine Contact information: [place] [address] [phone]        [name], DO .   Specialty: Cardiology Contact information: [place] [address] [phone]           Discharge Plan:  discharge to home  Disposition: stable    Signed: Yvonne Kendall Sagar Tengan for [facility] PA-C [facility] [phone] 01/06/2020, 11:13 AM

## 2020-01-06 NOTE — Telephone Encounter (Signed)
Transition Care Management Follow-up Telephone Call  Date of discharge and from where: 01/05/2020 from [facility]   How have you been since you were released from the hospital? stated "ok"  Any questions or concerns? None at this time . Stated he has woke up a little nauseous but took prescribed med and it is better now.   Items Reviewed:  Did the pt receive and understand the discharge instructions provided? Yes / and reviewed again today   Medications obtained and verified? YES   Any new allergies since your discharge? NONE   Dietary orders reviewed?YES   Do you have support at home? YES Family   Functional Questionnaire: (I = Independent and D = Dependent) ADLs: I / stated family will assist if needed     Follow up appointments reviewed:   PCP Hospital f/u appt confirmed?  Scheduled to see Dr Joya Gaskins  on 01/19/2020@ [facility] f/u appt confirmed? Not scheduled yet / stated will call later today  Are transportation arrangements needed? NO   If their condition worsens, is the pt aware to call PCP or go to the Emergency Dept.? YES pt is aware/ Made pt aware of MD discharge instructions and signs and symptoms to watch for and if experiencing any to return to ED and call MD. Verbalized understanding    Was the patient provided with contact information for the PCP's office or ED? YES   Was to pt encouraged to call back with questions or concerns? YES  Stressed the importance to strictly  adhere to MD  hospital discharge instructions, record daily weight , take med as instructed, avoid straining activities, cervical collar use, record daily temperature.    DME  Pt discharged home with  Cervical Brace Cervical Brace: Hard collar

## 2020-01-06 NOTE — Telephone Encounter (Signed)
Transition of care contact from inpatient facility  Date of discharge: 01/05/2020 Date of contact: 01/06/2020 Method: Phone Spoke to: Patient  Patient contacted to discuss transition of care from recent inpatient hospitalization. Patient was admitted to [facility] from 05/05-05/01/2020  with discharge diagnosis of severe cervical spondylitic radiculopathy, underwent anterior cervical discectomy and fusion C5-7 on the fascia.   Medication changes were reviewed.  Patient will follow up with his/her outpatient HD unit on: 01/07/2020. Patient says he is not going to HD. Encouraged him to do so.

## 2020-01-09 IMAGING — CR DG CHEST 2V
2 series · 2 of 2 positions shown · non-contrast
Comparison: Cardiac CT dated June 14, 2018. Chest x-ray dated
April 14, 2018.

CLINICAL DATA: Intermittent left-sided pleuritic chest pain and
shortness of breath for the past 6 months.

EXAM:
CHEST - 2 VIEW

[w chest pa]
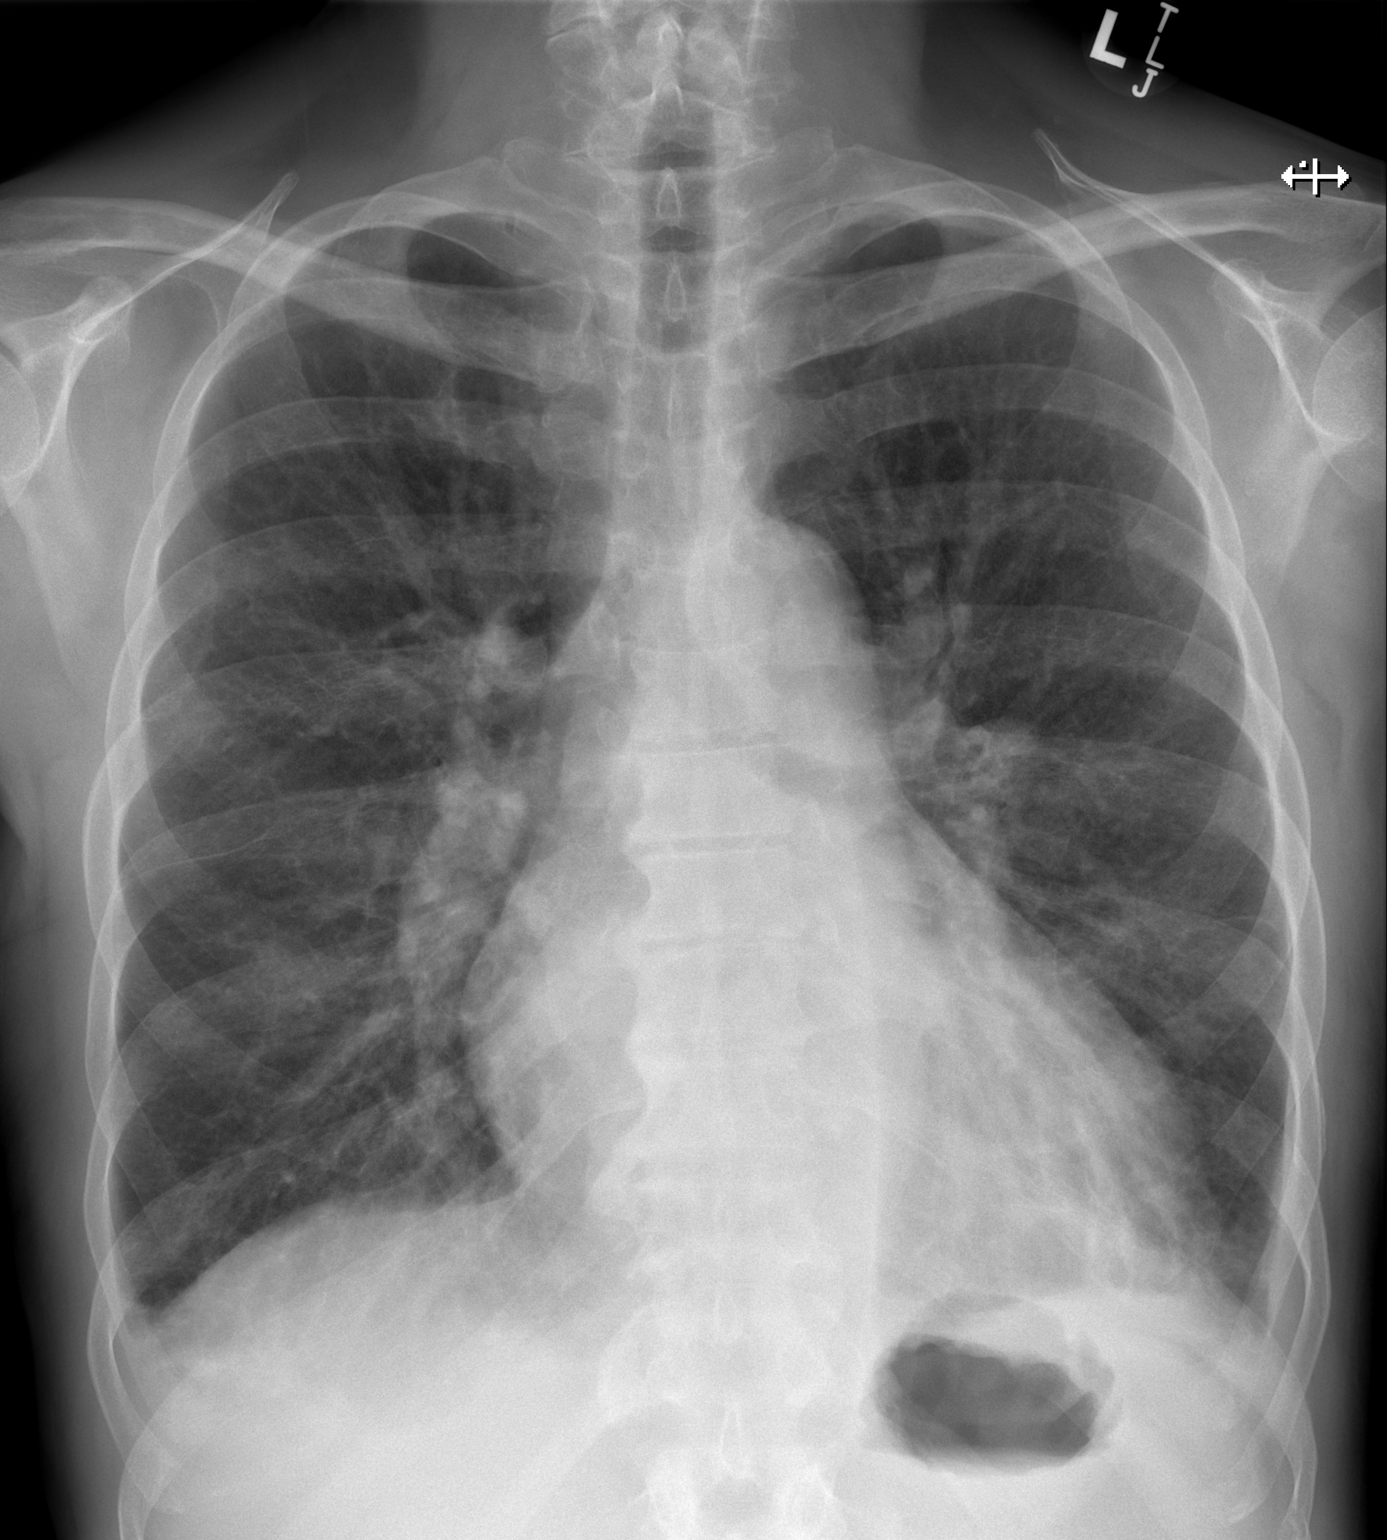

[w chest lat]
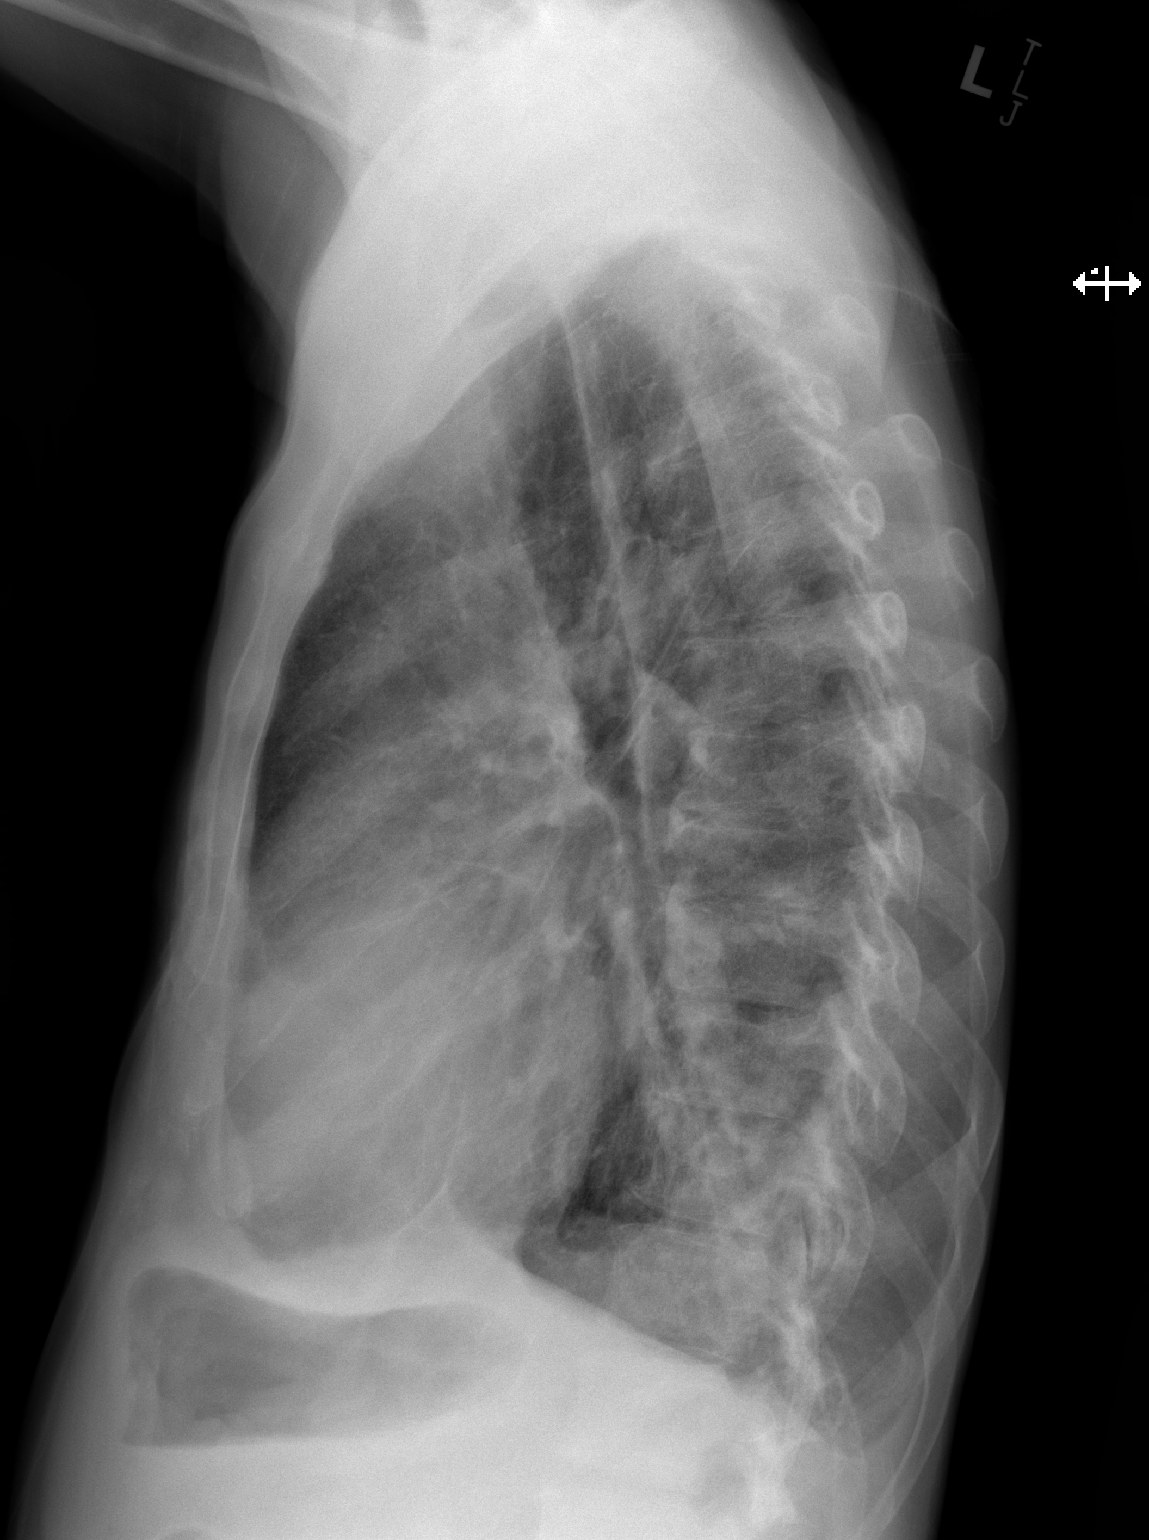

[2 of 2 positions shown; findings below may reference images not displayed]

FINDINGS: Unchanged moderate cardiomegaly. Normal mediastinal contours.
Atherosclerotic calcification of the aortic arch. Normal pulmonary
vascularity. Unchanged scarring/atelectasis in the left lower lobe.
No pleural effusion or pneumothorax. No acute osseous abnormality.
IMPRESSION: 1. No active cardiopulmonary disease. Unchanged scarring/atelectasis
in the left lower lobe.

## 2020-01-19 ENCOUNTER — Ambulatory Visit: Payer: Medicare Other | Admitting: Critical Care Medicine

## 2020-01-19 IMAGING — DX DG CHEST 2V
2 series · 2 of 2 positions shown · non-contrast
Comparison: 05/06/2019

CLINICAL DATA: Pt here from home with c/o chest pressure non
radiating , with chronic sob , pt states that the pain was worse on
[REDACTED] ,

EXAM:
CHEST - 2 VIEW

[chest pa]
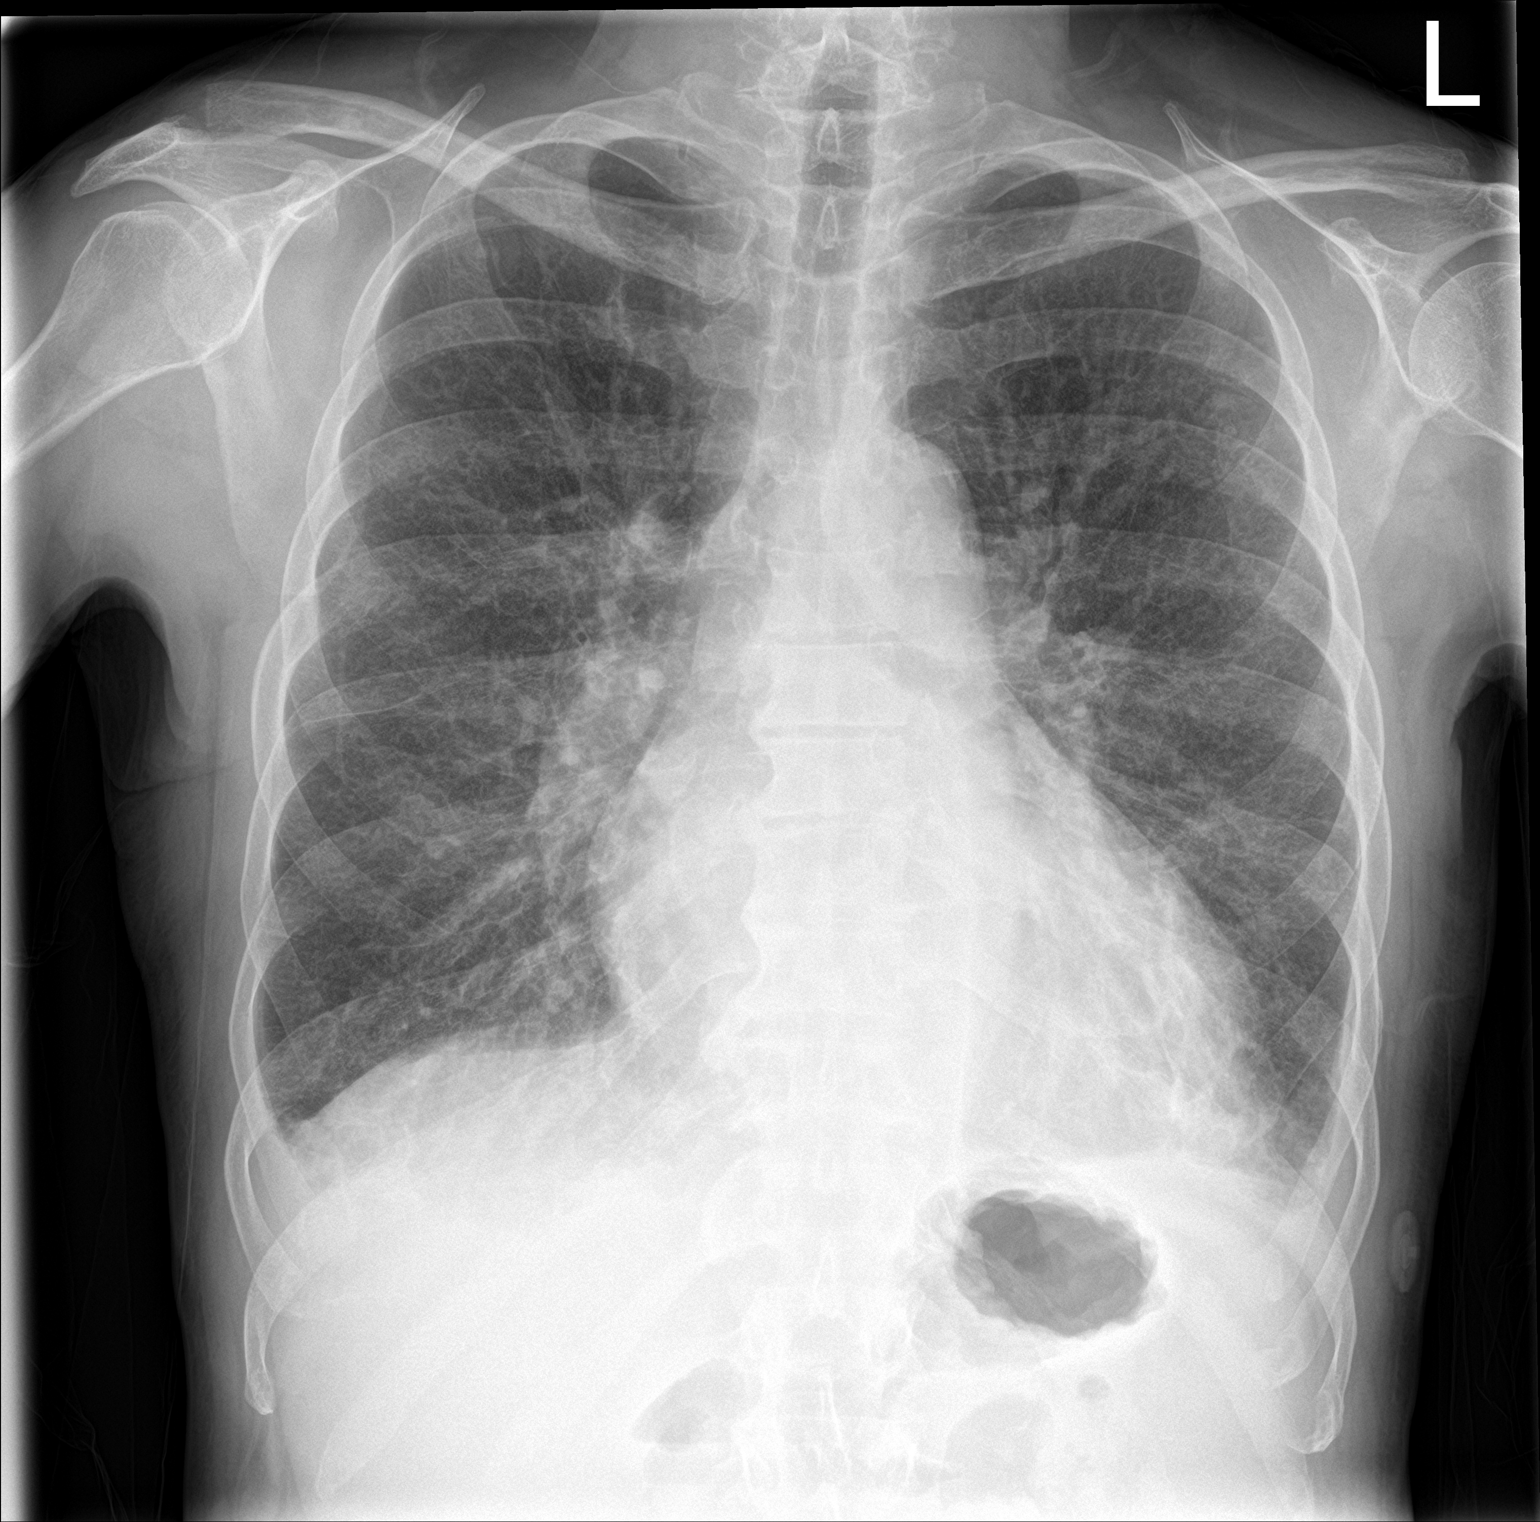

[chest lat]
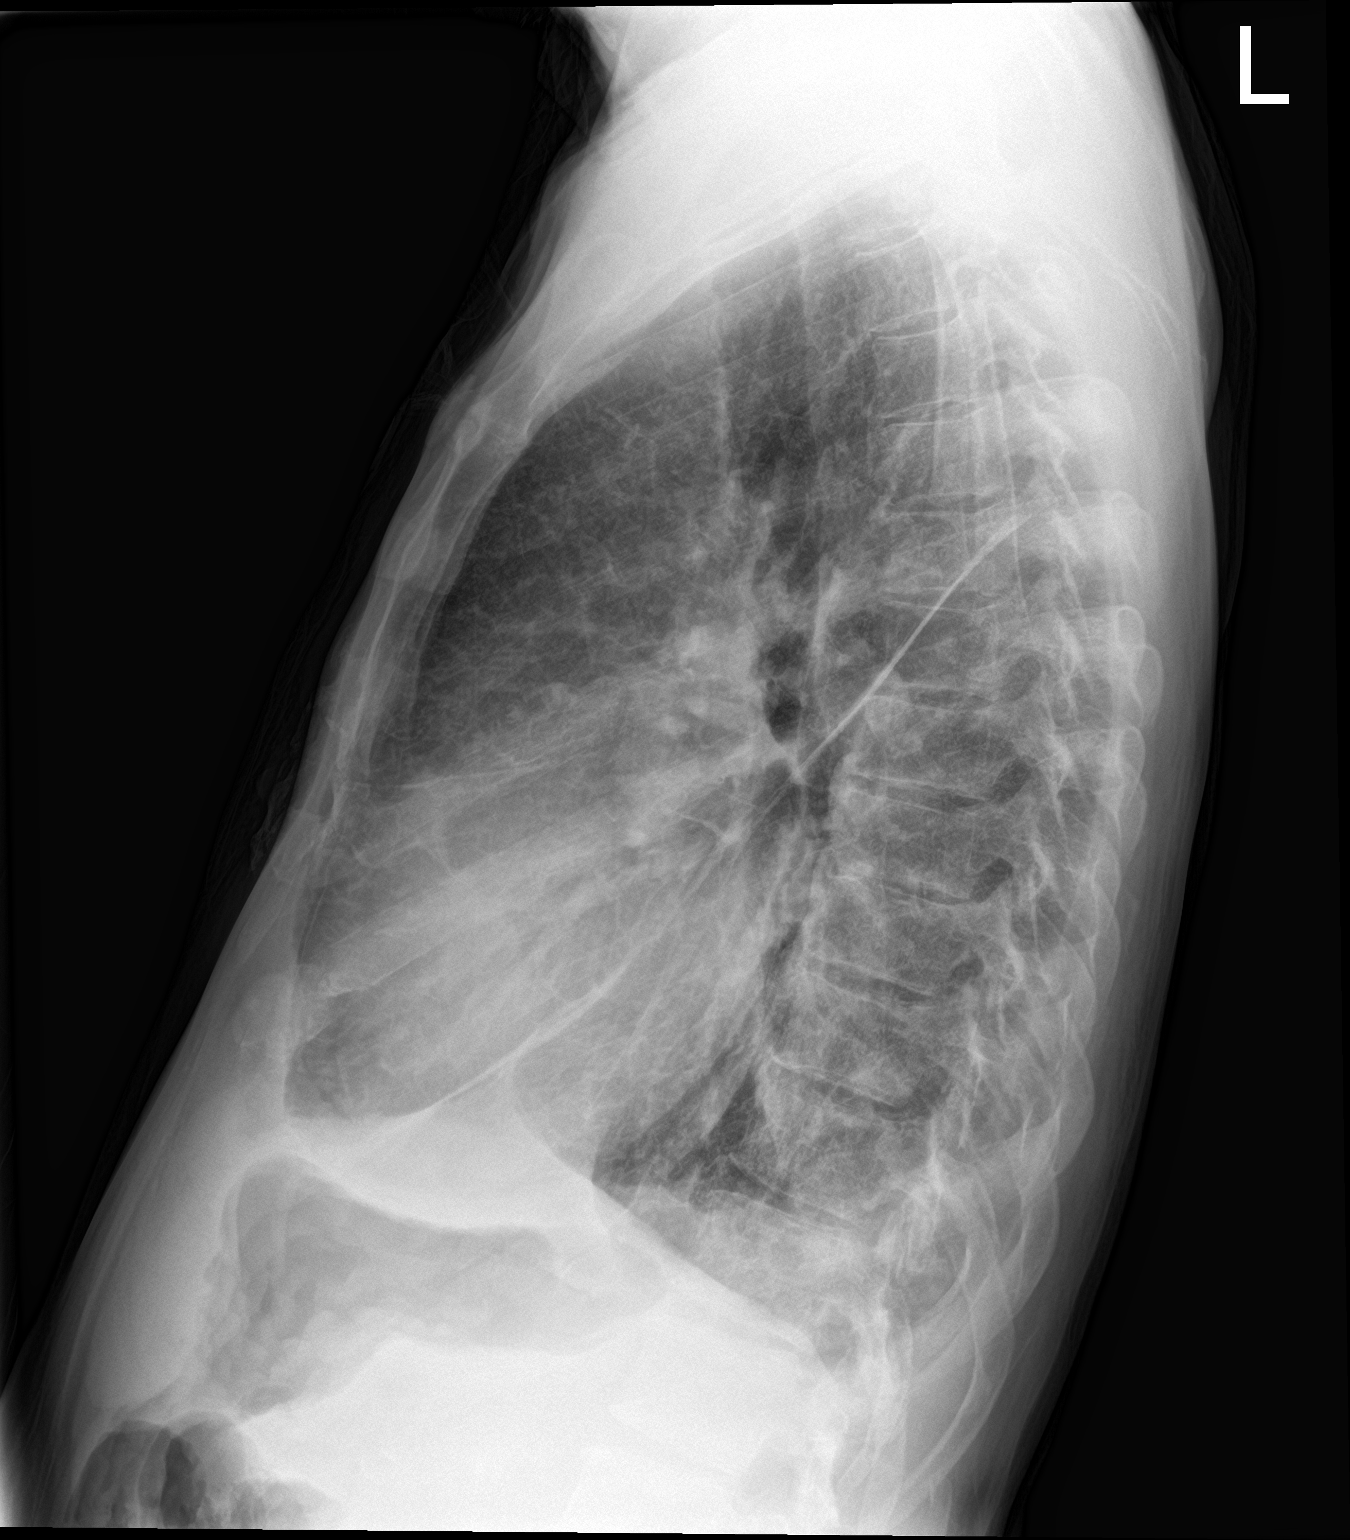

[2 of 2 positions shown; findings below may reference images not displayed]

FINDINGS: Cardiac silhouette is mildly enlarged. No mediastinal or hilar
masses. No evidence of adenopathy.

Lungs are hyperexpanded. There are thickened interstitial markings
bilaterally including thickening of the fissures, increased when
compared to the prior exam. No areas of lung consolidation.

No pleural effusion or pneumothorax.

Skeletal structures are intact.
IMPRESSION: 1. Findings consistent with mild congestive heart failure with
interstitial pulmonary edema. No evidence of pneumonia.

## 2020-01-19 NOTE — Progress Notes (Deleted)
Subjective:    Patient ID: Thomas Robinson, male    DOB: 1961-01-02, 59 y.o.   MRN: [id number]  58 y.o.M post hosp f/u  TCM NOTE and EST CARE PCP  Admit date: 01/04/2020 Discharge date: 01/06/2020  Admission Diagnoses:  Active Problems:   Cervical disc herniation   Tachycardia   SOB (shortness of breath)   Discharge Diagnoses:  Active Problems:   Cervical disc herniation   Tachycardia   SOB (shortness of breath)  status post Procedure(s): ANTERIOR CERVICAL DECOMPRESSION/DISCECTOMY FUSION CERVICAL FIVE THROUGH SEVEN  Past Medical History: Diagnosis Date . Acute kidney injury superimposed on CKD ([place]) 05/24/2017 . Acute on chronic diastolic CHF (congestive heart failure) ([place]) 04/13/2018 . AKI (acute kidney injury) ([place]) 04/10/2015 . Arthritis  . Asthma  . Asthma, chronic, unspecified asthma severity, with acute exacerbation 10/23/2017 . Atypical chest pain 02/16/2018 . CAP (community acquired pneumonia) 10/23/2017 . Cataract   right - removed by surgery . Cerebral infarction due to thrombosis of cerebral artery ([place])  . Cerebrovascular accident (CVA) ([place])  . Chronic kidney disease  . CKD (chronic kidney disease), stage V ([place]) 05/24/2017 . Cough   chronic cough . Dyspnea   "all the time" per patient at PAT 12/30/19  RA sats 87%, no oxygen use . ESRD (end stage renal disease) on dialysis ([place]) 02/16/2018  Dialysis  T Thus Sat  - [facility]  . Essential hypertension 04/09/2015 . GERD (gastroesophageal reflux disease) 06/04/2019 . GI bleeding 05/24/2017 . History of completed stroke 2018 . Hyperlipidemia  . Hypertension  . Hypertensive urgency 05/24/2017 . Kidney failure  . Oxygen deficiency 12/30/2019  O2 sats on RA 87% at PAT appt  . Respiratory failure, acute ([place]) 10/23/2017 . Restless leg syndrome  . Rib fractures 06/2019  Right . Stroke ([place]) 2018 . Symptomatic anemia 05/24/2017 . Thoracic ascending aortic aneurysm ([place])   4.5 cm  06/04/19 CT   Surgeries: Procedure(s): ANTERIOR CERVICAL DECOMPRESSION/DISCECTOMY FUSION CERVICAL FIVE THROUGH SEVEN on 01/04/2020   Consultants: Internal medicine   Discharged Condition: Improved  Hospital Course: Thomas Robinson is an 59 y.o. male who was admitted 01/04/2020 for operative treatment of Cervical spondylotic radiculopathy. Patient failed conservative treatments (please see the history and physical for the specifics) and had severe unremitting pain that affects sleep, daily activities and work/hobbies. After pre-op clearance, the patient was taken to the operating room on 01/04/2020 and underwent  Procedure(s): ANTERIOR CERVICAL DECOMPRESSION/DISCECTOMY FUSION [place].    Patient was given perioperative antibiotics:  Anti-infectives (From admission, onward)   Start     Dose/Rate Route Frequency Ordered Stop  01/04/20 1830   ceFAZolin (ANCEF) IVPB 1 g/50 mL premix    1 g 100 mL/hr over 30 Minutes Intravenous Every 8 hours 01/04/20 1616 01/05/20 0350  01/04/20 0837   vancomycin (VANCOCIN) IVPB 1000 mg/200 mL premix    1,000 mg 200 mL/hr over 60 Minutes Intravenous 60 min pre-op 01/04/20 0837 01/04/20 1008      Patient was given sequential compression devices and early ambulation to prevent DVT.   Patient benefited maximally from hospital stay and there were no complications. At the time of discharge, the patient was urinating/moving their bowels without difficulty, tolerating a regular diet, pain is controlled with oral pain medications and they have been cleared by PT/OT.    TCM RN note: Transition Care Management Follow-up Telephone Call  Date of discharge and from where: 01/05/2020 from [facility]   How have you been since  you were released from the hospital? stated "ok"  Any questions or concerns? None at this time . Stated he has woke up a little nauseous but took prescribed med and it is better now.   Items Reviewed:  Did the  pt receive and understand the discharge instructions provided? Yes / and reviewed again today   Medications obtained and verified? YES   Any new allergies since your discharge? NONE   Dietary orders reviewed?YES   Do you have support at home? YES Family   Functional Questionnaire: (I = Independent and D = Dependent) ADLs: I / stated family will assist if needed     Follow up appointments reviewed:   PCP Hospital f/u appt confirmed?  Scheduled to see Dr Joya Gaskins  on 01/19/2020@ [facility] f/u appt confirmed? Not scheduled yet / stated will call later today  Are transportation arrangements needed? NO   If their condition worsens, is the pt aware to call PCP or go to the Emergency Dept.? YES pt is aware/ Made pt aware of MD discharge instructions and signs and symptoms to watch for and if experiencing any to return to ED and call MD. Verbalized understanding    Was the patient provided with contact information for the PCP's office or ED? YES   Was to pt encouraged to call back with questions or concerns? YES  Stressed the importance to strictly  adhere to MD  hospital discharge instructions, record daily weight , take med as instructed, avoid straining activities, cervical collar use, record daily temperature.    DME  Pt discharged home with  Cervical Brace Cervical Brace: Hard collar      Review of Systems     Objective:   Physical Exam        Assessment & Plan:

## 2020-01-23 ENCOUNTER — Encounter (HOSPITAL_COMMUNITY): Payer: Self-pay | Admitting: *Deleted

## 2020-01-23 ENCOUNTER — Telehealth: Payer: Self-pay | Admitting: Cardiology

## 2020-01-23 ENCOUNTER — Inpatient Hospital Stay (HOSPITAL_COMMUNITY)
Admission: EM | Admit: 2020-01-23 | Discharge: 2020-01-24 | DRG: 640 | Discharge status: Against Medical Advice | Payer: Medicare Other | Attending: Internal Medicine | Admitting: Internal Medicine

## 2020-01-23 ENCOUNTER — Emergency Department (HOSPITAL_COMMUNITY): Payer: Medicare Other

## 2020-01-23 ENCOUNTER — Other Ambulatory Visit: Payer: Self-pay

## 2020-01-23 DIAGNOSIS — J4520 Mild intermittent asthma, uncomplicated: Secondary | ICD-10-CM | POA: Diagnosis present

## 2020-01-23 DIAGNOSIS — Z20822 Contact with and (suspected) exposure to covid-19: Secondary | ICD-10-CM | POA: Diagnosis present

## 2020-01-23 DIAGNOSIS — I10 Essential (primary) hypertension: Secondary | ICD-10-CM

## 2020-01-23 DIAGNOSIS — G2581 Restless legs syndrome: Secondary | ICD-10-CM | POA: Diagnosis present

## 2020-01-23 DIAGNOSIS — E875 Hyperkalemia: Secondary | ICD-10-CM | POA: Diagnosis present

## 2020-01-23 DIAGNOSIS — I712 Thoracic aortic aneurysm, without rupture: Secondary | ICD-10-CM

## 2020-01-23 DIAGNOSIS — Z8673 Personal history of transient ischemic attack (TIA), and cerebral infarction without residual deficits: Secondary | ICD-10-CM

## 2020-01-23 DIAGNOSIS — I132 Hypertensive heart and chronic kidney disease with heart failure and with stage 5 chronic kidney disease, or end stage renal disease: Secondary | ICD-10-CM | POA: Diagnosis present

## 2020-01-23 DIAGNOSIS — I5033 Acute on chronic diastolic (congestive) heart failure: Secondary | ICD-10-CM

## 2020-01-23 DIAGNOSIS — J9601 Acute respiratory failure with hypoxia: Secondary | ICD-10-CM | POA: Diagnosis present

## 2020-01-23 DIAGNOSIS — I160 Hypertensive urgency: Secondary | ICD-10-CM | POA: Diagnosis present

## 2020-01-23 DIAGNOSIS — E785 Hyperlipidemia, unspecified: Secondary | ICD-10-CM | POA: Diagnosis present

## 2020-01-23 DIAGNOSIS — Z9115 Patient's noncompliance with renal dialysis: Secondary | ICD-10-CM

## 2020-01-23 DIAGNOSIS — N2581 Secondary hyperparathyroidism of renal origin: Secondary | ICD-10-CM | POA: Diagnosis present

## 2020-01-23 DIAGNOSIS — J810 Acute pulmonary edema: Secondary | ICD-10-CM | POA: Diagnosis not present

## 2020-01-23 DIAGNOSIS — I714 Abdominal aortic aneurysm, without rupture: Secondary | ICD-10-CM | POA: Diagnosis present

## 2020-01-23 DIAGNOSIS — Z992 Dependence on renal dialysis: Secondary | ICD-10-CM

## 2020-01-23 DIAGNOSIS — Z981 Arthrodesis status: Secondary | ICD-10-CM

## 2020-01-23 DIAGNOSIS — D631 Anemia in chronic kidney disease: Secondary | ICD-10-CM | POA: Diagnosis present

## 2020-01-23 DIAGNOSIS — E8770 Fluid overload, unspecified: Principal | ICD-10-CM

## 2020-01-23 DIAGNOSIS — M1990 Unspecified osteoarthritis, unspecified site: Secondary | ICD-10-CM | POA: Diagnosis present

## 2020-01-23 DIAGNOSIS — N186 End stage renal disease: Secondary | ICD-10-CM

## 2020-01-23 DIAGNOSIS — Z79899 Other long term (current) drug therapy: Secondary | ICD-10-CM

## 2020-01-23 DIAGNOSIS — I5032 Chronic diastolic (congestive) heart failure: Secondary | ICD-10-CM | POA: Diagnosis present

## 2020-01-23 DIAGNOSIS — N179 Acute kidney failure, unspecified: Secondary | ICD-10-CM | POA: Diagnosis present

## 2020-01-23 DIAGNOSIS — Z9119 Patient's noncompliance with other medical treatment and regimen: Secondary | ICD-10-CM

## 2020-01-23 DIAGNOSIS — Z9114 Patient's other noncompliance with medication regimen: Secondary | ICD-10-CM

## 2020-01-23 DIAGNOSIS — M898X9 Other specified disorders of bone, unspecified site: Secondary | ICD-10-CM | POA: Diagnosis present

## 2020-01-23 DIAGNOSIS — K219 Gastro-esophageal reflux disease without esophagitis: Secondary | ICD-10-CM

## 2020-01-23 DIAGNOSIS — I272 Pulmonary hypertension, unspecified: Secondary | ICD-10-CM | POA: Diagnosis present

## 2020-01-23 DIAGNOSIS — J811 Chronic pulmonary edema: Secondary | ICD-10-CM | POA: Diagnosis present

## 2020-01-23 DIAGNOSIS — R0789 Other chest pain: Secondary | ICD-10-CM | POA: Diagnosis present

## 2020-01-23 DIAGNOSIS — I7121 Aneurysm of the ascending aorta, without rupture: Secondary | ICD-10-CM | POA: Diagnosis present

## 2020-01-23 DIAGNOSIS — Z5329 Procedure and treatment not carried out because of patient's decision for other reasons: Secondary | ICD-10-CM | POA: Diagnosis present

## 2020-01-23 DIAGNOSIS — Z8701 Personal history of pneumonia (recurrent): Secondary | ICD-10-CM

## 2020-01-23 HISTORY — DX: Acute pulmonary edema: J81.0

## 2020-01-23 LAB — BASIC METABOLIC PANEL
Anion gap: 20 — ABNORMAL HIGH (ref 5–15)
BUN: 61 mg/dL — ABNORMAL HIGH (ref 6–20)
CO2: 23 mmol/L (ref 22–32)
Calcium: 9.6 mg/dL (ref 8.9–10.3)
Chloride: 93 mmol/L — ABNORMAL LOW (ref 98–111)
Creatinine, Ser: 9.89 mg/dL — ABNORMAL HIGH (ref 0.61–1.24)
GFR calc Af Amer: 6 mL/min — ABNORMAL LOW (ref >60)
GFR calc non Af Amer: 5 mL/min — ABNORMAL LOW (ref >60)
Glucose, Bld: 91 mg/dL (ref 70–99)
Potassium: 5.8 mmol/L — ABNORMAL HIGH (ref 3.5–5.1)
Sodium: 136 mmol/L (ref 135–145)

## 2020-01-23 LAB — CBC
HCT: 31.7 % — ABNORMAL LOW (ref 39.0–52.0)
Hemoglobin: 10 g/dL — ABNORMAL LOW (ref 13.0–17.0)
MCH: 33.8 pg (ref 26.0–34.0)
MCHC: 31.5 g/dL (ref 30.0–36.0)
MCV: 107.1 fL — ABNORMAL HIGH (ref 80.0–100.0)
Platelets: 129 10*3/uL — ABNORMAL LOW (ref 150–400)
RBC: 2.96 MIL/uL — ABNORMAL LOW (ref 4.22–5.81)
RDW: 15.9 % — ABNORMAL HIGH (ref 11.5–15.5)
WBC: 4.4 10*3/uL (ref 4.0–10.5)
nRBC: 0 % (ref 0.0–0.2)

## 2020-01-23 LAB — BRAIN NATRIURETIC PEPTIDE: B Natriuretic Peptide: 4186.6 pg/mL — ABNORMAL HIGH (ref 0.0–100.0)

## 2020-01-23 LAB — SARS CORONAVIRUS 2 BY RT PCR (HOSPITAL ORDER, PERFORMED IN ~~LOC~~ HOSPITAL LAB): SARS Coronavirus 2: NEGATIVE

## 2020-01-23 LAB — TROPONIN I (HIGH SENSITIVITY)
Troponin I (High Sensitivity): 38 ng/L — ABNORMAL HIGH (ref <18)
Troponin I (High Sensitivity): 31 ng/L — ABNORMAL HIGH (ref <18)

## 2020-01-23 MED ORDER — HYDRALAZINE HCL 20 MG/ML IJ SOLN
10.0000 mg | Freq: Once | INTRAMUSCULAR | Status: AC
  Administered 2020-01-23: 10 mg via INTRAVENOUS
  Filled 2020-01-23: qty 1

## 2020-01-23 MED ORDER — MORPHINE SULFATE (PF) 4 MG/ML IV SOLN
4.0000 mg | Freq: Once | INTRAVENOUS | Status: AC
  Administered 2020-01-23: 4 mg via INTRAVENOUS
  Filled 2020-01-23: qty 1

## 2020-01-23 MED ORDER — CHLORHEXIDINE GLUCONATE CLOTH 2 % EX PADS: 6.0000 | MEDICATED_PAD | Freq: Every day | CUTANEOUS | Status: DC

## 2020-01-23 MED ORDER — SODIUM CHLORIDE 0.9% FLUSH: 3.0000 mL | Freq: Once | INTRAVENOUS | Status: DC

## 2020-01-23 MED ORDER — CALCIUM GLUCONATE-NACL 1-0.675 GM/50ML-% IV SOLN
1.0000 g | Freq: Once | INTRAVENOUS | Status: AC
  Administered 2020-01-23: 1000 mg via INTRAVENOUS
  Filled 2020-01-23: qty 50

## 2020-01-23 MED ORDER — FUROSEMIDE 10 MG/ML IJ SOLN
40.0000 mg | Freq: Once | INTRAMUSCULAR | Status: AC
  Administered 2020-01-23: 40 mg via INTRAVENOUS
  Filled 2020-01-23: qty 4

## 2020-01-23 MED ORDER — SODIUM ZIRCONIUM CYCLOSILICATE 10 G PO PACK
10.0000 g | PACK | Freq: Once | ORAL | Status: AC
  Administered 2020-01-23: 10 g via ORAL
  Filled 2020-01-23: qty 1

## 2020-01-23 NOTE — ED Notes (Signed)
PT transported to CT>

## 2020-01-23 NOTE — Telephone Encounter (Signed)
Pt c/o swelling: STAT is pt has developed SOB within 24 hours  1) How much weight have you gained and in what time span? No  2) If swelling, where is the swelling located? Legs, ankles, feet, and left hand  3) Are you currently taking a fluid pill? No  4) Are you currently SOB? Yes  5) Do you have a log of your daily weights (if so, list)? No  6) Have you gained 3 pounds in a day or 5 pounds in a week? No  7) Have you traveled recently? No     Pt c/o Shortness Of Breath: STAT if SOB developed within the last 24 hours or pt is noticeably SOB on the phone  1. Are you currently SOB (can you hear that pt is SOB on the phone)? Yes  2. How long have you been experiencing SOB? 1-2 days  3. Are you SOB when sitting or when up moving around? Both  4. Are you currently experiencing any other symptoms? Chest pain, nausea   Pt c/o of Chest Pain: STAT if CP now or developed within 24 hours  1. Are you having CP right now? No  2. Are you experiencing any other symptoms (ex. SOB, nausea, vomiting, sweating)? nausea  3. How long have you been experiencing CP? 1-2  4. Is your CP continuous or coming and going? Continuous  5. Have you taken Nitroglycerin? No ?

## 2020-01-23 NOTE — Progress Notes (Signed)
I was called about this patient.  He is a chronic dialysis patient who presented to the hospital this evening with SOB.  In looking at the records it appears that he only goes to dialysis about twice a week and usually signs off after 2 hours.  He has not not been reaching his EDW. Blood pressure is high pre and post HD  He is volume overloaded and K is slightly high at 5.8.  His K is being treated medically and I will arrange for dialysis first shift in the AM-  Dialysis is aware   His usual orders are [facility]-  TTS- 3 hours and 45 minutes- BFR 350- EDW 59 2 K/ 2 calc- profile #4 AVF- 16 gauge- no heparin -  hectorol 3 mcg mirceral 150 q 2 weeks  Formal consult will be done tomorrow   Louis Meckel

## 2020-01-23 NOTE — ED Triage Notes (Signed)
Pt says that he has had worsening bilateral leg swelling. He says he feels like he has a pressure in the center of his chest. He has had worsening SOB. Steri strips intact to the left anterior neck area, no drainage.

## 2020-01-23 NOTE — ED Provider Notes (Signed)
[facility] EMERGENCY DEPARTMENT Provider Note   CSN: [id number] Arrival date & time: 01/23/20  1929     History Chief Complaint  Patient presents with   Chest Pain    Thomas Robinson is a 59 y.o. male with past medical history significant for acute on chronic diastolic CHF, CVA, CKD stage V, ESRD on dialysis T/TH/S, hypertension, hyperlipidemia, thoracic ascending aortic aneurysm, anemia presents to emergency department today via EMS with chief complaint of chest pain and shortness of breath.  Patient states his symptoms have been going on x3 days and have been progressively worsening.  He describes the pain in his chest as a pressure in the center of his chest stating it feels like he is being stabbed with a rod iron.  He states the pain waxes and wanes but is always present.  He rates the pain is 7/10 in severity.  There is no radiation of pain.  Pain is not worse with exertion.  Patient states he left dialysis early at his last session x2 days ago, he thinks about 30 minutes early.  He states he noticed swelling in his bilateral lower extremities.  He has a history of the same but states this looks worse than normal.  He is unable to walk because his legs were so swollen.  Patient states he was standing at the bathroom vanity today and he felt so weak he had to hold on so that he would not fall.  He denies cough, fever, chills, abdominal pain, urinary symptoms, diarrhea.  Patient has history of ascending thoracic aortic aneurysm measuring 4.5 on CT 06/04/2019. He tells me he has follow up every 3 months for evaluation. Cardiologist is Dr. Harriet Masson.  Past Medical History:  Diagnosis Date   Acute kidney injury superimposed on CKD ([place]) 05/24/2017   Acute on chronic diastolic CHF (congestive heart failure) ([place]) 04/13/2018   AKI (acute kidney injury) ([place]) 04/10/2015   Arthritis    Asthma    Asthma, chronic, unspecified asthma severity, with acute exacerbation 10/23/2017    Atypical chest pain 02/16/2018   CAP (community acquired pneumonia) 10/23/2017   Cataract    right - removed by surgery   Cerebral infarction due to thrombosis of cerebral artery ([place])    Cerebrovascular accident (CVA) ([place])    Chronic kidney disease    CKD (chronic kidney disease), stage V ([place]) 05/24/2017   Cough    chronic cough   Dyspnea    "all the time" per patient at PAT 12/30/19  RA sats 87%, no oxygen use   ESRD (end stage renal disease) on dialysis ([place]) 02/16/2018   Dialysis  T Thus Sat  - [facility]    Essential hypertension 04/09/2015   GERD (gastroesophageal reflux disease) 06/04/2019   GI bleeding 05/24/2017   History of completed stroke 2018   Hyperlipidemia    Hypertension    Hypertensive urgency 05/24/2017   Kidney failure    Oxygen deficiency 12/30/2019   O2 sats on RA 87% at PAT appt    Respiratory failure, acute ([place]) 10/23/2017   Restless leg syndrome    Rib fractures 06/2019   Right   Stroke ([place]) 2018   Symptomatic anemia 05/24/2017   Thoracic ascending aortic aneurysm ([facility])    4.5 cm 06/04/19 CT    Patient Active Problem List   Diagnosis Date Noted   Cervical disc herniation 01/04/2020   Tachycardia 01/04/2020   SOB (shortness of breath)    Rib fractures 06/05/2019  Fall at home, initial encounter 06/04/2019   Right rib fracture 06/04/2019   Lung contusion 06/04/2019   Acute respiratory failure with hypoxia ([place]) 06/04/2019   Anemia in ESRD (end-stage renal disease) ([place]) 06/04/2019   GERD (gastroesophageal reflux disease) 06/04/2019   Chronic diastolic (congestive) heart failure ([place]) 06/04/2019   Fall 06/04/2019   Thoracic ascending aortic aneurysm ([place]) 06/04/2019   Acute exacerbation of congestive heart failure ([place]) 05/16/2019   Chest pain 04/13/2018   Acute on chronic diastolic CHF (congestive heart failure) ([place]) 04/13/2018   Chest pressure    Atypical chest pain 02/16/2018   ESRD on  dialysis ([place]) 02/16/2018   Volume overload 10/23/2017   Cough 08/23/2017   Constipation    Macrocytic anemia 05/24/2017   Hypertensive urgency 05/24/2017   History of completed stroke    Essential hypertension 04/09/2015    Past Surgical History:  Procedure Laterality Date   ANTERIOR CERVICAL DECOMP/DISCECTOMY FUSION N/A 01/04/2020   Procedure: ANTERIOR CERVICAL DECOMPRESSION/DISCECTOMY FUSION CERVICAL FIVE THROUGH SEVEN;  Surgeon: Melina Schools, MD;  Location: [facility];  Service: Orthopedics;  Laterality: N/A;  3 hrs   AV FISTULA PLACEMENT Left 05/28/2017   Procedure: LEFT ARM ARTERIOVENOUS (AV) FISTULA CREATION;  Surgeon: Conrad Belmore, MD;  Location: [facility];  Service: Vascular;  Laterality: Left;   EYE SURGERY Right 06/02/2019   Cataract removed   FRACTURE SURGERY     IR FLUORO GUIDE CV LINE RIGHT  05/25/2017   IR US GUIDE VASC ACCESS RIGHT  05/25/2017   REVISON OF ARTERIOVENOUS FISTULA Left 07/18/2019   Procedure: REVISION PLICATION OF RADIOCEPHALIC ARTERIOVENOUS FISTULA LEFT ARM;  Surgeon: Angelia Mould, MD;  [facility];  Service: Vascular;  Laterality: Left;   RINOPLASTY         Family History  Problem Relation Age of Onset   Cancer Mother     Social History   Tobacco Use   Smoking status: Never Smoker   Smokeless tobacco: Never Used  Substance Use Topics   Alcohol use: Not Currently    Alcohol/week: 0.0 standard drinks    Comment: has a drink once in a while- 12/30/19   Drug use: No    Home Medications Prior to Admission medications   Medication Sig Start Date End Date Taking? Authorizing Provider  amLODipine (NORVASC) 10 MG tablet Take 1 tablet (10 mg total) by mouth daily. 05/18/19   Martyn Malay, MD  atorvastatin (LIPITOR) 20 MG tablet Take 1 tablet (20 mg total) by mouth daily. 06/10/19 12/30/19  Tobb, Kardie, DO  carvedilol (COREG) 12.5 MG tablet Take 1 tablet (12.5 mg total) by mouth 2 (two) times daily with a meal. 05/18/19    Martyn Malay, MD  hydrALAZINE (APRESOLINE) 100 MG tablet Take 1 tablet (100 mg total) by mouth every 8 (eight) hours. Patient taking differently: Take 100 mg by mouth 2 (two) times daily.  05/18/19   Martyn Malay, MD  isosorbide mononitrate (IMDUR) 30 MG 24 hr tablet Take 1 tablet (30 mg total) by mouth daily. 07/15/19 12/30/19  Tobb, Kardie, DO  metoprolol tartrate (LOPRESSOR) 100 MG tablet Take 100 mg by mouth 2 (two) times daily.     [provider]  ondansetron (ZOFRAN) 4 MG tablet Take 1 tablet (4 mg total) by mouth every 8 (eight) hours as needed for nausea or vomiting. 01/05/20   Melina Schools, MD  sevelamer (RENAGEL) 800 MG tablet Take 800 mg by mouth daily.  01/25/18   [provider]  Allergies    Patient has no known allergies.  Review of Systems   Review of Systems All other systems are reviewed and are negative for acute change except as noted in the HPI.  Physical Exam Updated Vital Signs BP (!) 181/84    Pulse 63    Temp 98.1 F (36.7 C) (Oral)    Resp 19    SpO2 98%   Physical Exam Vitals and nursing note reviewed.  Constitutional:      General: He is not in acute distress.    Appearance: He is not ill-appearing.  HENT:     Head: Normocephalic and atraumatic.     Right Ear: Tympanic membrane and external ear normal.     Left Ear: Tympanic membrane and external ear normal.     Nose: Nose normal.     Mouth/Throat:     Mouth: Mucous membranes are moist.     Pharynx: Oropharynx is clear.  Eyes:     General: No scleral icterus.       Right eye: No discharge.        Left eye: No discharge.     Extraocular Movements: Extraocular movements intact.     Conjunctiva/sclera: Conjunctivae normal.     Pupils: Pupils are equal, round, and reactive to light.  Neck:     Vascular: No JVD.  Cardiovascular:     Rate and Rhythm: Normal rate and regular rhythm.     Pulses: Normal pulses.          Radial pulses are 2+ on the right side and 2+ on the left  side.     Heart sounds: Normal heart sounds.  Pulmonary:     Comments: Lungs clear to auscultation in all fields. Symmetric chest rise. No wheezing, rales, or rhonchi. Chest:     Chest wall: No tenderness.  Abdominal:     Tenderness: There is no right CVA tenderness or left CVA tenderness.     Comments: Abdomen is soft, non-distended, and non-tender in all quadrants. No rigidity, no guarding. No peritoneal signs.  Musculoskeletal:        General: Normal range of motion.     Cervical back: Normal range of motion.     Right lower leg: 2+ Pitting Edema present.     Left lower leg: 2+ Pitting Edema present.  Skin:    General: Skin is warm and dry.     Capillary Refill: Capillary refill takes less than 2 seconds.     Comments: left radiocephalic AV fistula with palpable thrill/ good bruit.   Neurological:     Mental Status: He is oriented to person, place, and time.     GCS: GCS eye subscore is 4. GCS verbal subscore is 5. GCS motor subscore is 6.     Comments: Fluent speech, no facial droop.  Psychiatric:        Behavior: Behavior normal.       ED Results / Procedures / Treatments   Labs (all labs ordered are listed, but only abnormal results are displayed) Labs Reviewed  BASIC METABOLIC PANEL - Abnormal; Notable for the following components:      Result Value   Potassium 5.8 (*)    Chloride 93 (*)    BUN 61 (*)    Creatinine, Ser 9.89 (*)    GFR calc non Af Amer 5 (*)    GFR calc Af Amer 6 (*)    Anion gap 20 (*)    All other components within  normal limits  CBC - Abnormal; Notable for the following components:   RBC 2.96 (*)    Hemoglobin 10.0 (*)    HCT 31.7 (*)    MCV 107.1 (*)    RDW 15.9 (*)    Platelets 129 (*)    All other components within normal limits  BRAIN NATRIURETIC PEPTIDE - Abnormal; Notable for the following components:   B Natriuretic Peptide 4,186.6 (*)    All other components within normal limits  TROPONIN I (HIGH SENSITIVITY) - Abnormal; Notable  for the following components:   Troponin I (High Sensitivity) 38 (*)    All other components within normal limits  SARS CORONAVIRUS 2 BY RT PCR (HOSPITAL ORDER, [facility])  TROPONIN I (HIGH SENSITIVITY)    EKG EKG Interpretation  Date/Time:  Monday Jan 23 2020 19:36:32 EDT Ventricular Rate:  64 PR Interval:    QRS Duration: 87 QT Interval:  456 QTC Calculation: 471 R Axis:   78 Text Interpretation: Sinus arrhythmia Left ventricular hypertrophy Abnrm T, consider ischemia, anterolateral lds No significant change since last tracing Confirmed by Wandra Arthurs [phone]) on 01/23/2020 9:04:11 PM   Radiology CT Chest Wo Contrast  Result Date: 01/23/2020 CLINICAL DATA:  Chest pain, left-sided swelling EXAM: CT CHEST WITHOUT CONTRAST TECHNIQUE: Multidetector CT imaging of the chest was performed following the standard protocol without IV contrast. COMPARISON:  06/04/2019 FINDINGS: Cardiovascular: Evaluation of the heart and vascular structures is limited without IV contrast. Aneurysmal dilatation of the ascending thoracic aorta is again identified measuring 4.4 cm, not significantly changed since prior study. Aortic arch and descending thoracic aorta are normal in caliber. The heart is mildly enlarged but stable. Trace pericardial fluid. There is significant atherosclerosis of the aorta and coronary vasculature. Mediastinum/Nodes: Nonspecific mediastinal lymphadenopathy measures up to 10 mm in the precarinal region, previously having measured 8 mm. The thyroid, trachea, and esophagus are grossly unremarkable. Lungs/Pleura: There is diffuse interlobular septal thickening with central vascular congestion, consistent with interstitial edema. Trace bilateral pleural effusions are noted. There is patchy atelectasis or scarring at the lung bases. Central airways are patent. Upper Abdomen: No acute abnormality. Musculoskeletal: No acute displaced fractures. Prior healed right posterior  ninth rib fracture. Reconstructed images demonstrate no additional findings. IMPRESSION: 1. Stable 4.4 cm ascending thoracic aortic aneurysm. Recommend annual imaging followup by CTA or MRA. This recommendation follows 2010 ACCF/AHA/AATS/ACR/ASA/SCA/SCAI/SIR/STS/SVM Guidelines for the Diagnosis and Management of Patients with Thoracic Aortic Disease. Circulation. 2010; 121: K876-O115. Aortic aneurysm NOS (ICD10-I71.9) 2. Interstitial pulmonary edema, with trace bilateral pleural effusions. 3. Nonspecific mediastinal lymphadenopathy, slightly increased. 4. Aortic Atherosclerosis (ICD10-I70.0). Electronically Signed   By: Randa Ngo M.D.   On: 01/23/2020 22:11   DG Chest Portable 1 View  Result Date: 01/23/2020 CLINICAL DATA:  Chest pain short of breath EXAM: PORTABLE CHEST 1 VIEW COMPARISON:  01/04/2020, 12/30/2019, 04/13/2018 FINDINGS: Hardware in the cervical spine. Cardiomegaly with mild central congestion. Mild diffuse interstitial and ground-glass opacity. Aortic atherosclerosis. No pneumothorax. IMPRESSION: Cardiomegaly with slight central congestion and mild diffuse interstitial and ground-glass opacities suspicious for mild edema. Electronically Signed   By: Donavan Foil M.D.   On: 01/23/2020 20:10    Procedures .Critical Care Performed by: Cherre Robins, PA-C Authorized by: Cherre Robins, PA-C   Critical care provider statement:    Critical care time (minutes):  40   Critical care time was exclusive of:  Teaching time and separately billable procedures and treating other patients  Critical care was necessary to treat or prevent imminent or life-threatening deterioration of the following conditions:  Respiratory failure and metabolic crisis   Critical care was time spent personally by me on the following activities:  Blood draw for specimens, development of treatment plan with patient or surrogate, discussions with consultants, evaluation of patient's response to treatment,  examination of patient, obtaining history from patient or surrogate, ordering and performing treatments and interventions, ordering and review of laboratory studies, ordering and review of radiographic studies, pulse oximetry, re-evaluation of patient's condition and review of old charts   (including critical care time)  Medications Ordered in ED Medications  sodium chloride flush (NS) 0.9 % injection 3 mL (has no administration in time range)  sodium zirconium cyclosilicate (LOKELMA) packet 10 g (has no administration in time range)  calcium gluconate 1 g/ 50 mL sodium chloride IVPB (1,000 mg Intravenous New Bag/Given 01/23/20 2144)  morphine 4 MG/ML injection 4 mg (4 mg Intravenous Given 01/23/20 2031)  furosemide (LASIX) injection 40 mg (40 mg Intravenous Given 01/23/20 2136)  hydrALAZINE (APRESOLINE) injection 10 mg (10 mg Intravenous Given 01/23/20 2136)    ED Course  I have reviewed the triage vital signs and the nursing notes.  Pertinent labs & imaging results that were available during my care of the patient were reviewed by me and considered in my medical decision making (see chart for details).    MDM Rules/Calculators/A&P                      History provided by patient and EMS with additional history obtained from chart review.    59 year old male presenting with chest pain and shortness of breath.  He was found to be hypoxic to 88% on room air.  3 L nasal cannula was applied by EMS with improvement to 94%.  On arrival BP elevated at 184/88, no tachycardia.  Lungs are clear to auscultation in all fields.  No chest tenderness on exam, no abdominal tenderness.  He does have 2+ bilateral lower extremity edema.  Heart score 4. Labs show no leukocytosis, hemoglobin 10.0 consistent with baseline, hyperkalemia 5.8, BUN/Creatinine elevated at 61/9.89, anion gap of 20. BNP elevated at 4,186.6. First troponin is 38. Covid test is negative. Chest xray viewed by me shows  Cardiomegaly with  central congestion and edema. EKG shows the beginning of what looks to be peaked T waves. Patient given lokelma and calcium gluconate. Also given lasix 40mg  IV and hydrazlaine as his blood pressure elevated. CT chest shows stable 4.4 cm. This case was discussed with Dr. Darl Householder who has seen the patient and agrees with plan to admit. Consulted nephrology Dr. Moshe Cipro who will arrange patient to likely have dialysis first thing tomorrow morning.   Spoke with Dr. Corinna Capra with hospitalist service who agrees to assume care of patient and bring into the hospital for further evaluation and management.    Portions of this note were generated with [employer]. Dictation errors may occur despite best attempts at proofreading.  Final Clinical Impression(s) / ED Diagnoses Final diagnoses:  Acute on chronic diastolic congestive heart failure ([place])  Hyperkalemia    Rx / DC Orders ED Discharge Orders    None       Flint Melter 01/23/20 2257    Drenda Freeze, MD 01/25/20 [phone]

## 2020-01-23 NOTE — H&P (Signed)
History and Physical    Thomas Robinson ZWC:[id number] DOB: 04/21/1961 DOA: 01/23/2020  PCP: Charlott Rakes, MD  Patient coming from:  Home I have personally briefly reviewed patient's old medical records in [place]  Chief Complaint:  Shortness of breath, chest pain   HPI: Thomas Robinson is a 59 y.o. male with medical history significant of end-stage renal disease on hemodialysis Tuesday through Saturday via left upper extremity AV fistula, hypertension, hyperlipidemia, mild intermittent asthma, history of stroke, severe cervical spondylitic radiculopathy (s/p anterior cervical discectomy and fusion C5-7) who came to hospital for evaluation of shortness of breath and chest pain.  Patient stated that he is compliant with his hemodialysis, with last treatment on Saturday.  He, had to come off a bit earlier, about 30 minutes,  from dialysis due to cramping.  He stated that his weight is at the dry weight. Over the past 3 days, he worsening edema of lower extremities, as well as intermittent chest pain, sharp, located to retrosternal area, 5/10, no radiation, associated with shortness of breath.  Patient reports as well, ongoing malaise and fatigue.   Denies having fever or chills, nausea or vomiting, abdominal pain, diarrhea dysuria. Due to ongoing symptoms he decided to come to ED for evaluation.  In the ED patient was afebrile, temperature 98.1 F. He was hypertensive 181/85, without tachycardia.   He was hypoxic 89% on room air, slightly tachypneic, requiring 3 L of oxygen support. CBC showed no leukocytosis.  No concerning anemia hemoglobin 10.0, mild thrombocytopenia 129. Chemistry revealed elevation of creatinine to 9.8, consistent with his known ESRD. Noted mild hyperkalemia 5.8, treated medically in the ED. proBNP 4186. Troponins without significant elevation, stable, 38-31 with delta -7. Patient was negative for coronavirus 2019 CT chest showed stable 4.4 cm ascending  thoracic aortic aneurysm, mild interstitial pulmonary edema with small bilateral pleural effusion, as well as minimal lymphadenopathy.  Patient treated with IV Lasix for fluid overload, calcium gluconate, Lokelma for hyperkalemia, IV hydralazine for hypertensive urgency as well as IV morphine for pain. Case was discussed with nephrology on-call, who will dialyze patient in the morning. Patient will be admitted to the hospital for close monitoring and treatment   Review of Systems  Constitutional: Positive for malaise/fatigue. Negative for chills and fever.  HENT: Negative for hearing loss and tinnitus.   Respiratory: Positive for cough and shortness of breath. Negative for hemoptysis and wheezing.   Cardiovascular: Positive for chest pain, orthopnea and leg swelling.  Gastrointestinal: Positive for heartburn. Negative for abdominal pain, diarrhea, nausea and vomiting.  Genitourinary: Negative for dysuria and urgency.  Skin: Negative.   Neurological: Negative for dizziness, sensory change, speech change and headaches.  Endo/Heme/Allergies: Does not bruise/bleed easily.  Psychiatric/Behavioral: Negative for depression and hallucinations. The patient is not nervous/anxious.   As per HPI otherwise all other systems reviewed and are negative.  Past Medical History:  Diagnosis Date  . Acute kidney injury superimposed on CKD ([place]) 05/24/2017  . Acute on chronic diastolic CHF (congestive heart failure) ([place]) 04/13/2018  . AKI (acute kidney injury) ([place]) 04/10/2015  . Arthritis   . Asthma   . Asthma, chronic, unspecified asthma severity, with acute exacerbation 10/23/2017  . Atypical chest pain 02/16/2018  . CAP (community acquired pneumonia) 10/23/2017  . Cataract    right - removed by surgery  . Cerebral infarction due to thrombosis of cerebral artery ([place])   . Cerebrovascular accident (CVA) ([place])   . Chronic kidney disease   .  CKD (chronic kidney disease), stage V ([place]) 05/24/2017  . Cough     chronic cough  . Dyspnea    "all the time" per patient at PAT 12/30/19  RA sats 87%, no oxygen use  . ESRD (end stage renal disease) on dialysis ([place]) 02/16/2018   Dialysis  T Thus Sat  - [facility]   . Essential hypertension 04/09/2015  . GERD (gastroesophageal reflux disease) 06/04/2019  . GI bleeding 05/24/2017  . History of completed stroke 2018  . Hyperlipidemia   . Hypertension   . Hypertensive urgency 05/24/2017  . Kidney failure   . Oxygen deficiency 12/30/2019   O2 sats on RA 87% at PAT appt   . Respiratory failure, acute ([place]) 10/23/2017  . Restless leg syndrome   . Rib fractures 06/2019   Right  . Stroke ([place]) 2018  . Symptomatic anemia 05/24/2017  . Thoracic ascending aortic aneurysm ([facility])    4.5 cm 06/04/19 CT    Past Surgical History:  Procedure Laterality Date  . ANTERIOR CERVICAL DECOMP/DISCECTOMY FUSION N/A 01/04/2020   Procedure: ANTERIOR CERVICAL DECOMPRESSION/DISCECTOMY FUSION CERVICAL FIVE THROUGH SEVEN;  Surgeon: Melina Schools, MD;  Location: [place];  Service: Orthopedics;  Laterality: N/A;  3 hrs  . AV FISTULA PLACEMENT Left 05/28/2017   Procedure: LEFT ARM ARTERIOVENOUS (AV) FISTULA CREATION;  Surgeon: Conrad Oak Grove, MD;  Location: [place];  Service: Vascular;  Laterality: Left;  . EYE SURGERY Right 06/02/2019   Cataract removed  . FRACTURE SURGERY    . IR FLUORO GUIDE CV LINE RIGHT  05/25/2017  . IR US GUIDE VASC ACCESS RIGHT  05/25/2017  . REVISON OF ARTERIOVENOUS FISTULA Left 07/18/2019   Procedure: REVISION PLICATION OF RADIOCEPHALIC ARTERIOVENOUS FISTULA LEFT ARM;  Surgeon: Angelia Mould, MD;  Location: [place];  Service: Vascular;  Laterality: Left;  . RINOPLASTY      Social History  reports that he has never smoked. He has never used smokeless tobacco. He reports previous alcohol use. He reports that he does not use drugs.  No Known Allergies  Family History  Problem Relation Age of Onset  . Cancer Mother    Prior to Admission  medications   Medication Sig Start Date End Date Taking? Authorizing Provider  amLODipine (NORVASC) 10 MG tablet Take 1 tablet (10 mg total) by mouth daily. 05/18/19   Martyn Malay, MD  atorvastatin (LIPITOR) 20 MG tablet Take 1 tablet (20 mg total) by mouth daily. 06/10/19 12/30/19  Tobb, Kardie, DO  carvedilol (COREG) 12.5 MG tablet Take 1 tablet (12.5 mg total) by mouth 2 (two) times daily with a meal. 05/18/19   Martyn Malay, MD  hydrALAZINE (APRESOLINE) 100 MG tablet Take 1 tablet (100 mg total) by mouth every 8 (eight) hours. Patient taking differently: Take 100 mg by mouth 2 (two) times daily.  05/18/19   Martyn Malay, MD  isosorbide mononitrate (IMDUR) 30 MG 24 hr tablet Take 1 tablet (30 mg total) by mouth daily. 07/15/19 12/30/19  Tobb, Kardie, DO  metoprolol tartrate (LOPRESSOR) 100 MG tablet Take 100 mg by mouth 2 (two) times daily.     [provider]  ondansetron (ZOFRAN) 4 MG tablet Take 1 tablet (4 mg total) by mouth every 8 (eight) hours as needed for nausea or vomiting. 01/05/20   Melina Schools, MD  sevelamer (RENAGEL) 800 MG tablet Take 800 mg by mouth daily.  01/25/18   [provider]    Physical Exam: Vitals:  01/23/20 2136 01/23/20 2200 01/23/20 2215 01/23/20 2245  BP: (!) 194/90 (!) 186/82 (!) 182/79 (!) 181/85  Pulse: 60 60 64 65  Resp: 19 (!) 21 19 19   Temp:      TempSrc:      SpO2: 94% (!) 86% 94% (!) 89%    Constitutional: NAD, calm, comfortable Vitals:   01/23/20 2136 01/23/20 2200 01/23/20 2215 01/23/20 2245  BP: (!) 194/90 (!) 186/82 (!) 182/79 (!) 181/85  Pulse: 60 60 64 65  Resp: 19 (!) 21 19 19   Temp:      TempSrc:      SpO2: 94% (!) 86% 94% (!) 89%   Eyes: PERRL, lids and conjunctivae normal ENMT: Mucous membranes are moist. Posterior pharynx clear of any exudate or lesions.Normal dentition.  Neck: normal, supple, no masses, no thyromegaly Respiratory: clear to auscultation bilaterally, no wheezing, no crackles. Normal  respiratory effort. No accessory muscle use.  Cardiovascular: Regular rate and rhythm, no murmurs / rubs / gallops. No extremity edema. 2+ pedal pulses. No carotid bruits.  Abdomen: no tenderness, no masses palpated. No hepatosplenomegaly. Bowel sounds positive.  Musculoskeletal: no clubbing / cyanosis. No joint deformity upper and lower extremities. Good ROM, no contractures. Normal muscle tone.  Skin: no rashes, lesions, ulcers. No induration Neurologic: CN 2-12 grossly intact. Sensation intact, DTR normal. Strength 5/5 in all 4.  Psychiatric: Normal judgment and insight. Alert and oriented x 3. Normal mood.    Labs on Admission: I have personally reviewed following labs and imaging studies  CBC: Recent Labs  Lab 01/23/20 1940  WBC 4.4  HGB 10.0*  HCT 31.7*  MCV 107.1*  PLT 604*  Basic Metabolic Panel: Recent Labs  Lab 01/23/20 1940  NA 136  K 5.8*  CL 93*  CO2 23  GLUCOSE 91  BUN 61*  CREATININE 9.89*  CALCIUM 9.6   GFR: CrCl cannot be calculated (Unknown ideal weight.).  Liver Function Tests: No results for input(s): AST, ALT, ALKPHOS, BILITOT, PROT, ALBUMIN in the last 168 hours.  Urine analysis:    Component Value Date/Time   COLORURINE YELLOW 05/24/2017 1729   APPEARANCEUR HAZY (A) 05/24/2017 1729   LABSPEC 1.011 05/24/2017 1729   PHURINE 5.0 05/24/2017 1729   GLUCOSEU 50 (A) 05/24/2017 1729   HGBUR SMALL (A) 05/24/2017 1729   BILIRUBINUR NEGATIVE 05/24/2017 1729   KETONESUR NEGATIVE 05/24/2017 1729   PROTEINUR 100 (A) 05/24/2017 1729   UROBILINOGEN 0.2 04/09/2015 1527   NITRITE NEGATIVE 05/24/2017 1729   LEUKOCYTESUR TRACE (A) 05/24/2017 1729   Radiological Exams on Admission: CT Chest Wo Contrast  Result Date: 01/23/2020 CLINICAL DATA:  Chest pain, left-sided swelling EXAM: CT CHEST WITHOUT CONTRAST TECHNIQUE: Multidetector CT imaging of the chest was performed following the standard protocol without IV contrast. COMPARISON:  06/04/2019 FINDINGS:  Cardiovascular: Evaluation of the heart and vascular structures is limited without IV contrast. Aneurysmal dilatation of the ascending thoracic aorta is again identified measuring 4.4 cm, not significantly changed since prior study. Aortic arch and descending thoracic aorta are normal in caliber. The heart is mildly enlarged but stable. Trace pericardial fluid. There is significant atherosclerosis of the aorta and coronary vasculature. Mediastinum/Nodes: Nonspecific mediastinal lymphadenopathy measures up to 10 mm in the precarinal region, previously having measured 8 mm. The thyroid, trachea, and esophagus are grossly unremarkable. Lungs/Pleura: There is diffuse interlobular septal thickening with central vascular congestion, consistent with interstitial edema. Trace bilateral pleural effusions are noted. There is patchy atelectasis or scarring at the lung bases.  Central airways are patent. Upper Abdomen: No acute abnormality. Musculoskeletal: No acute displaced fractures. Prior healed right posterior ninth rib fracture. Reconstructed images demonstrate no additional findings. IMPRESSION: 1. Stable 4.4 cm ascending thoracic aortic aneurysm. Recommend annual imaging followup by CTA or MRA. This recommendation follows 2010 ACCF/AHA/AATS/ACR/ASA/SCA/SCAI/SIR/STS/SVM Guidelines for the Diagnosis and Management of Patients with Thoracic Aortic Disease. Circulation. 2010; 121: K742-V956. Aortic aneurysm NOS (ICD10-I71.9) 2. Interstitial pulmonary edema, with trace bilateral pleural effusions. 3. Nonspecific mediastinal lymphadenopathy, slightly increased. 4. Aortic Atherosclerosis (ICD10-I70.0). Electronically Signed   By: Randa Ngo M.D.   On: 01/23/2020 22:11   DG Chest Portable 1 View  Result Date: 01/23/2020 CLINICAL DATA:  Chest pain short of breath EXAM: PORTABLE CHEST 1 VIEW COMPARISON:  01/04/2020, 12/30/2019, 04/13/2018 FINDINGS: Hardware in the cervical spine. Cardiomegaly with mild central congestion.  Mild diffuse interstitial and ground-glass opacity. Aortic atherosclerosis. No pneumothorax. IMPRESSION: Cardiomegaly with slight central congestion and mild diffuse interstitial and ground-glass opacities suspicious for mild edema. Electronically Signed   By: Donavan Foil M.D.   On: 01/23/2020 20:10   EKG: Independently reviewed.  T wave inversion V1 V2 grossly unchanged from the past EKG, peaked T waves.  No acute ST-T changes  Assessment/Plan Principal Problem:   Volume overload Active Problems:   Essential hypertension   Hypertensive urgency   Atypical chest pain   ESRD on dialysis (HCC)   Acute on chronic diastolic CHF (congestive heart failure) (HCC)   Acute respiratory failure with hypoxia (HCC)   Anemia in ESRD (end-stage renal disease) (HCC)   GERD (gastroesophageal reflux disease)   Thoracic ascending aortic aneurysm (HCC)   Volume overload leading to acute pulmonary edema in the setting of end-stage renal disease Acute respiratory failure with hypoxia Acute diastolic congestive heart failure with preserved ejection fraction (EF 60-65%) Patient does have significant volume overload with anasarca.  He is hypoxic and requires supplemental oxygen. Would benefit from dialysis to remove the volume.  IV Lasix given in the ED. No evidence of acute coronary syndrome.  Troponins are trending down.  EKG without acute ischemic changes.  Atypical chest pain Stable ascending thoracic aortic aneurysm Appears to be noncardiac, presumably due to volume overload. CT chest revealed stable ascending aortic thoracic aneurysm, unlikely cause of pain. No ischemic evaluation available to review. May benefit from outpatient stress test.   End-stage renal disease on hemodialysis TTS Hyperkalemia Nephrology has been consulted, for hemodialysis, which will be done as soon as possible. Dry weight may need adjustment.  Hyperkalemia was treated with calcium gluconate and Lokelma. Continue Renvela  for hyperphosphatemia  Hypertensive urgency Likely triggered by volume overload.  Continue home antihypertensive medications. Currently on amlodipine, carvedilol, hydralazine and Imdur May use IV hydralazine for SBP more than 180 and DBP more than 110.  Hyperlipidemia      Continue atorvastatin Gastroesophageal reflux disease     Continue with as needed antacids Anemia in end stage renal disease    Hgb stable at 10. Continue to monitor   DVT prophylaxis: Heparin   Code Status:        Full code Family Communication:   Court Joy, friend [phone] Disposition Plan:   Patient is from:  Home  Anticipated DC to:  Home  Anticipated DC date:  3-4 days  Anticipated DC barriers: None  Consults called:  ED called nephrology - Dr Moshe Cipro  Admission status:  Inpatient   Severity of Illness: The appropriate patient status for this patient is INPATIENT. Inpatient status is judged  to be reasonable and necessary in order to provide the required intensity of service to ensure the patient's safety. The patient's presenting symptoms, physical exam findings, and initial radiographic and laboratory data in the context of their chronic comorbidities is felt to place them at high risk for further clinical deterioration. Furthermore, it is not anticipated that the patient will be medically stable for discharge from the hospital within 2 midnights of admission. The following factors support the patient status of inpatient.   " The patient's presenting symptoms include: chest pain, dyspnea, cough " The worrisome physical exam findings include: volume overload with anasarca " The initial radiographic and laboratory data are worrisome because of: acute pulmonary edema, hyperkalemia  " The chronic co-morbidities include. ESRD  * I certify that at the point of admission it is my clinical judgment that the patient will require inpatient hospital care spanning beyond 2 midnights from the point of admission  due to high intensity of service, high risk for further deterioration and high frequency of surveillance required.Allie Dimmer MD [facility]  How to contact the [facility] Attending or Consulting provider [place] or covering provider during after hours [place], for this patient?   1. Check the care team in [facility] and look for a) attending/consulting [facility] provider listed and b) the [facility] team listed 2. Log into [email or web address] and use [place]'s universal password to access. If you do not have the password, please contact the hospital operator. 3. Locate the [facility] provider you are looking for under [facility] and page to a number that you can be directly reached. 4. If you still have difficulty reaching the provider, please page the [facility] (Director on Call) for the Hospitalists listed on amion for assistance.  01/23/2020, 11:41 PM

## 2020-01-23 NOTE — ED Triage Notes (Signed)
Pt arrives via [facility] from home with medic reporting pt with c/o Intermittent cp since Saturday. Nausea, no vomiting. Bilateral leg swelling and left hand. 12 lead showed SR with inverted T waves. Recent neck surgery, last dialysis Saturday. Refused ASA en route, given 1 nitro. Initial sats 88%, placed on 2L, 187/104, 98% 2 L

## 2020-01-23 NOTE — Telephone Encounter (Signed)
Patient called in complaining of chest pain, SOB, and swelling in BLE. Patient stated this all started yesterday. Patient has not taken any nitro for chest pain. Patient stated one of his legs is looking bruised. Patient stated his chest pain feels like someone is pressuring two figures into his chest. Patient stated some of his symptoms have improved some today from yesterday. Patient sounds really SOB on the phone and he sounds like he is having a hard time getting his thoughts together. Encouraged patient to call 911 now, since he is still having chest pain and sounds SOB. Patient agreed to plan.

## 2020-01-24 ENCOUNTER — Encounter (HOSPITAL_COMMUNITY): Payer: Self-pay | Admitting: Internal Medicine

## 2020-01-24 ENCOUNTER — Other Ambulatory Visit: Payer: Self-pay

## 2020-01-24 LAB — RENAL FUNCTION PANEL
Albumin: 3.2 g/dL — ABNORMAL LOW (ref 3.5–5.0)
Anion gap: 20 — ABNORMAL HIGH (ref 5–15)
BUN: 65 mg/dL — ABNORMAL HIGH (ref 6–20)
CO2: 21 mmol/L — ABNORMAL LOW (ref 22–32)
Calcium: 9.2 mg/dL (ref 8.9–10.3)
Chloride: 94 mmol/L — ABNORMAL LOW (ref 98–111)
Creatinine, Ser: 10.64 mg/dL — ABNORMAL HIGH (ref 0.61–1.24)
GFR calc Af Amer: 5 mL/min — ABNORMAL LOW (ref >60)
GFR calc non Af Amer: 5 mL/min — ABNORMAL LOW (ref >60)
Glucose, Bld: 77 mg/dL (ref 70–99)
Phosphorus: 6 mg/dL — ABNORMAL HIGH (ref 2.5–4.6)
Potassium: 5.2 mmol/L — ABNORMAL HIGH (ref 3.5–5.1)
Sodium: 135 mmol/L (ref 135–145)

## 2020-01-24 LAB — CBC
HCT: 27.9 % — ABNORMAL LOW (ref 39.0–52.0)
Hemoglobin: 8.7 g/dL — ABNORMAL LOW (ref 13.0–17.0)
MCH: 33.2 pg (ref 26.0–34.0)
MCHC: 31.2 g/dL (ref 30.0–36.0)
MCV: 106.5 fL — ABNORMAL HIGH (ref 80.0–100.0)
Platelets: 122 10*3/uL — ABNORMAL LOW (ref 150–400)
RBC: 2.62 MIL/uL — ABNORMAL LOW (ref 4.22–5.81)
RDW: 15.8 % — ABNORMAL HIGH (ref 11.5–15.5)
WBC: 3.3 10*3/uL — ABNORMAL LOW (ref 4.0–10.5)
nRBC: 0 % (ref 0.0–0.2)

## 2020-01-24 LAB — HEPATITIS B SURFACE ANTIGEN: Hepatitis B Surface Ag: NONREACTIVE

## 2020-01-24 MED ORDER — HEPARIN SODIUM (PORCINE) 1000 UNIT/ML DIALYSIS: 1000.0000 [IU] | INTRAMUSCULAR | Status: DC | PRN

## 2020-01-24 MED ORDER — LIDOCAINE HCL (PF) 1 % IJ SOLN: 5.0000 mL | INTRAMUSCULAR | Status: DC | PRN

## 2020-01-24 MED ORDER — ONDANSETRON HCL 4 MG/2ML IJ SOLN: 4.0000 mg | Freq: Four times a day (QID) | INTRAMUSCULAR | Status: DC | PRN

## 2020-01-24 MED ORDER — HEPARIN SODIUM (PORCINE) 5000 UNIT/ML IJ SOLN
5000.0000 [IU] | Freq: Three times a day (TID) | INTRAMUSCULAR | Status: DC
  Filled 2020-01-24: qty 1

## 2020-01-24 MED ORDER — ALBUTEROL SULFATE (2.5 MG/3ML) 0.083% IN NEBU: 2.5000 mg | INHALATION_SOLUTION | RESPIRATORY_TRACT | Status: DC | PRN

## 2020-01-24 MED ORDER — ACETAMINOPHEN 325 MG PO TABS: 650.0000 mg | ORAL_TABLET | Freq: Four times a day (QID) | ORAL | Status: DC | PRN

## 2020-01-24 MED ORDER — LIDOCAINE-PRILOCAINE 2.5-2.5 % EX CREA: 1.0000 "application " | TOPICAL_CREAM | CUTANEOUS | Status: DC | PRN

## 2020-01-24 MED ORDER — POLYETHYLENE GLYCOL 3350 17 G PO PACK: 17.0000 g | PACK | Freq: Every day | ORAL | Status: DC | PRN

## 2020-01-24 MED ORDER — ONDANSETRON HCL 4 MG PO TABS: 4.0000 mg | ORAL_TABLET | Freq: Four times a day (QID) | ORAL | Status: DC | PRN

## 2020-01-24 MED ORDER — ATORVASTATIN CALCIUM 10 MG PO TABS: 20.0000 mg | ORAL_TABLET | Freq: Every day | ORAL | Status: DC

## 2020-01-24 MED ORDER — ALTEPLASE 2 MG IJ SOLR: 2.0000 mg | Freq: Once | INTRAMUSCULAR | Status: DC | PRN

## 2020-01-24 MED ORDER — CARVEDILOL 12.5 MG PO TABS: 12.5000 mg | ORAL_TABLET | Freq: Two times a day (BID) | ORAL | Status: DC

## 2020-01-24 MED ORDER — ISOSORBIDE MONONITRATE ER 30 MG PO TB24
30.0000 mg | ORAL_TABLET | Freq: Every day | ORAL | Status: DC
  Administered 2020-01-24: 30 mg via ORAL
  Filled 2020-01-24: qty 1

## 2020-01-24 MED ORDER — SEVELAMER CARBONATE 800 MG PO TABS
800.0000 mg | ORAL_TABLET | Freq: Three times a day (TID) | ORAL | Status: DC
  Administered 2020-01-24: 800 mg via ORAL
  Filled 2020-01-24: qty 1

## 2020-01-24 MED ORDER — ASPIRIN 81 MG PO CHEW
81.0000 mg | CHEWABLE_TABLET | Freq: Every day | ORAL | Status: DC
  Administered 2020-01-24: 81 mg via ORAL
  Filled 2020-01-24: qty 1

## 2020-01-24 MED ORDER — SODIUM CHLORIDE 0.9 % IV SOLN: 100.0000 mL | INTRAVENOUS | Status: DC | PRN

## 2020-01-24 MED ORDER — HYDRALAZINE HCL 20 MG/ML IJ SOLN: 10.0000 mg | Freq: Four times a day (QID) | INTRAMUSCULAR | Status: DC | PRN

## 2020-01-24 MED ORDER — HYDRALAZINE HCL 50 MG PO TABS
100.0000 mg | ORAL_TABLET | Freq: Two times a day (BID) | ORAL | Status: DC
  Administered 2020-01-24: 100 mg via ORAL
  Filled 2020-01-24: qty 2

## 2020-01-24 MED ORDER — AMLODIPINE BESYLATE 10 MG PO TABS
10.0000 mg | ORAL_TABLET | Freq: Every day | ORAL | Status: DC
  Administered 2020-01-24: 10 mg via ORAL
  Filled 2020-01-24: qty 1

## 2020-01-24 MED ORDER — ACETAMINOPHEN 650 MG RE SUPP: 650.0000 mg | Freq: Four times a day (QID) | RECTAL | Status: DC | PRN

## 2020-01-24 MED ORDER — PENTAFLUOROPROP-TETRAFLUOROETH EX AERO: 1.0000 "application " | INHALATION_SPRAY | CUTANEOUS | Status: DC | PRN

## 2020-01-24 MED ORDER — DOXERCALCIFEROL 4 MCG/2ML IV SOLN
3.0000 ug | INTRAVENOUS | Status: DC
  Filled 2020-01-24: qty 2

## 2020-01-24 MED ORDER — SODIUM CHLORIDE 0.9 % IV SOLN: 62.5000 mg | INTRAVENOUS | Status: DC

## 2020-01-24 NOTE — Progress Notes (Signed)
New Admission Note:   Arrival Method: stretcher Mental Orientation: Telemetry: box 8  Assessment: Completed Skin: see flowsheet IV:NSL Pain: none Tubes: none Safety Measures: Safety Fall Prevention Plan has been discussed Admission: Completed [facility] [place] Orientation: Patient has been orientated to the room, unit and staff.  Family: none  Orders have been reviewed and implemented. Will continue to monitor the patient. Call light has been placed within reach and bed alarm has been activated.   Rockie Neighbours BSN, RN Phone number: [phone]

## 2020-01-24 NOTE — Progress Notes (Signed)
Patient reports that he has to take care of a family member and wants to leave right now. Patient educated that this will be against medical advice.  Page to Dr. Benny Lennert for notification. AMA paper signed. Iv removed. Patient wheeled outside via [facility]. Dorthey Sawyer, RN

## 2020-01-24 NOTE — Discharge Summary (Signed)
Physician Discharge Summary  Thomas Robinson BJY:[id number] DOB: 1961/02/22 DOA: 01/23/2020  PCP: Charlott Rakes, MD  Admit date: 01/23/2020 Discharge date: 01/24/2020  Recommendations for Outpatient Follow-up:  1. Left AMA  Discharge Diagnoses: Principal diagnosis is #1 1. Volume overload 2. Pulmonary edema 3. ESRD on HD - noncompliance with OP HD schedule 4. Atypical chest pain 5. AAA - stable 6. Hyperkalemia 7. Hypertensive urgency 8. hyperlipidemia  Discharge Condition: Serious.  Disposition: AMA  Diet recommendation: AMA  Filed Weights   01/24/20 0155 01/24/20 0700 01/24/20 1131  Weight: 64.6 kg 64 kg 60.5 kg    History of present illness:  Thomas Robinson is a 59 y.o. male with medical history significant of end-stage renal disease on hemodialysis Tuesday through Saturday via left upper extremity AV fistula, hypertension, hyperlipidemia, mild intermittent asthma, history of stroke, severe cervical spondylitic radiculopathy (s/p anterior cervical discectomy and fusion C5-7) who came to hospital for evaluation of shortness of breath and chest pain.  Patient stated that he is compliant with his hemodialysis, with last treatment on Saturday.  He, had to come off a bit earlier, about 30 minutes,  from dialysis due to cramping.  He stated that his weight is at the dry weight. Over the past 3 days, he worsening edema of lower extremities, as well as intermittent chest pain, sharp, located to retrosternal area, 5/10, no radiation, associated with shortness of breath.  Patient reports as well, ongoing malaise and fatigue.   Denies having fever or chills, nausea or vomiting, abdominal pain, diarrhea dysuria. Due to ongoing symptoms he decided to come to ED for evaluation.  In the ED patient was afebrile, temperature 98.1 F. He was hypertensive 181/85, without tachycardia.   He was hypoxic 89% on room air, slightly tachypneic, requiring 3 L of oxygen support. CBC showed no  leukocytosis.  No concerning anemia hemoglobin 10.0, mild thrombocytopenia 129. Chemistry revealed elevation of creatinine to 9.8, consistent with his known ESRD. Noted mild hyperkalemia 5.8, treated medically in the ED. proBNP 4186. Troponins without significant elevation, stable, 38-31 with delta -7. Patient was negative for coronavirus 2019 CT chest showed stable 4.4 cm ascending thoracic aortic aneurysm, mild interstitial pulmonary edema with small bilateral pleural effusion, as well as minimal lymphadenopathy.  Patient treated with IV Lasix for fluid overload, calcium gluconate, Lokelma for hyperkalemia, IV hydralazine for hypertensive urgency as well as IV morphine for pain. Case was discussed with nephrology on-call, who will dialyze patient in the morning. Patient will be admitted to the hospital for close monitoring and treatment  Hospital Course:  The patient was admitted to a telemetry bed. Nephrology was consulted and the patient underwent HD this morning. He advised me on my visit of his desire to leave AMA. He said that I had 10 minutes to contact nephrology to discuss the patient's appropriateness for discharge given that he still had lower extremity edema and hypertension. This communication took more than 10 minutes to complete and the patient left AMA in the meantime.  Today's assessment: S: The patient is sitting up at bedside in a chair. Wants to leave in 10 minutes. O: Vitals:  Vitals:   01/24/20 1131 01/24/20 1144  BP: (!) 172/82 (!) 163/75  Pulse: 66 67  Resp: 18 18  Temp: (!) 97.5 F (36.4 C) 98 F (36.7 C)  SpO2: 98% 98%   Exam:  Constitutional:  . The patient is awake, alert, and oriented x 3. No acute distress. Respiratory:  . No increased work of  breathing. . No wheezes, rales, or rhonchi . No tactile fremitus Cardiovascular:  . Regular rate and rhythm . No murmurs, ectopy, or gallups. . No lateral PMI. No thrills. Abdomen:  . Abdomen is soft,  non-tender, non-distended . No hernias, masses, or organomegaly . Normoactive bowel sounds.  Musculoskeletal:  . No cyanosis or clubbing . 2+ pitting edema of lower extremities. Skin:  . No rashes, lesions, ulcers . palpation of skin: no induration or nodules Neurologic:  . CN 2-12 intact . Sensation all 4 extremities intact Psychiatric:  . Mental status o Mood, affect appropriate o Orientation to person, place, time  . judgment and insight appear intact   Discharge Instructions AMA  AMA No Known Allergies  The results of significant diagnostics from this hospitalization (including imaging, microbiology, ancillary and laboratory) are listed below for reference.    Significant Diagnostic Studies: DG Chest 2 View  Result Date: 12/30/2019 CLINICAL DATA:  Preoperative evaluation EXAM: CHEST - 2 VIEW COMPARISON:  06/05/2019 FINDINGS: Minimal enlargement of cardiac silhouette with slight pulmonary vascular congestion. Mediastinal contours normal. Slight chronic prominence of interstitial markings, unchanged. No definite acute infiltrate, pleural effusion or pneumothorax. Scattered endplate spur formation thoracic spine. IMPRESSION: Enlargement of cardiac silhouette with pulmonary vascular congestion. Slight chronic accentuation of interstitial markings without definite acute abnormalities Electronically Signed   By: Lavonia Dana M.D.   On: 12/30/2019 15:43   DG Cervical Spine 2-3 Views  Result Date: 01/04/2020 CLINICAL DATA:  Anterior cervical decompression/discectomy. Fusion C5 through C7. EXAM: CERVICAL SPINE - 2-3 VIEW; DG C-ARM 1-60 MIN COMPARISON:  None. FINDINGS: Three fluoroscopic spot views obtained in the operating room in frontal and lateral projections. Anterior fusion with interbody spacers at C5 through C7. Fluoroscopy time reported 2 minutes 12 seconds. IMPRESSION: Fluoroscopic spot views after C5-C7 anterior fusion. Electronically Signed   By: Keith Rake M.D.   On:  01/04/2020 14:30   CT Chest Wo Contrast  Result Date: 01/23/2020 CLINICAL DATA:  Chest pain, left-sided swelling EXAM: CT CHEST WITHOUT CONTRAST TECHNIQUE: Multidetector CT imaging of the chest was performed following the standard protocol without IV contrast. COMPARISON:  06/04/2019 FINDINGS: Cardiovascular: Evaluation of the heart and vascular structures is limited without IV contrast. Aneurysmal dilatation of the ascending thoracic aorta is again identified measuring 4.4 cm, not significantly changed since prior study. Aortic arch and descending thoracic aorta are normal in caliber. The heart is mildly enlarged but stable. Trace pericardial fluid. There is significant atherosclerosis of the aorta and coronary vasculature. Mediastinum/Nodes: Nonspecific mediastinal lymphadenopathy measures up to 10 mm in the precarinal region, previously having measured 8 mm. The thyroid, trachea, and esophagus are grossly unremarkable. Lungs/Pleura: There is diffuse interlobular septal thickening with central vascular congestion, consistent with interstitial edema. Trace bilateral pleural effusions are noted. There is patchy atelectasis or scarring at the lung bases. Central airways are patent. Upper Abdomen: No acute abnormality. Musculoskeletal: No acute displaced fractures. Prior healed right posterior ninth rib fracture. Reconstructed images demonstrate no additional findings. IMPRESSION: 1. Stable 4.4 cm ascending thoracic aortic aneurysm. Recommend annual imaging followup by CTA or MRA. This recommendation follows 2010 ACCF/AHA/AATS/ACR/ASA/SCA/SCAI/SIR/STS/SVM Guidelines for the Diagnosis and Management of Patients with Thoracic Aortic Disease. Circulation. 2010; 121: U235-T614. Aortic aneurysm NOS (ICD10-I71.9) 2. Interstitial pulmonary edema, with trace bilateral pleural effusions. 3. Nonspecific mediastinal lymphadenopathy, slightly increased. 4. Aortic Atherosclerosis (ICD10-I70.0). Electronically Signed   By:  Randa Ngo M.D.   On: 01/23/2020 22:11   DG Chest Portable 1 View  Result Date:  01/23/2020 CLINICAL DATA:  Chest pain short of breath EXAM: PORTABLE CHEST 1 VIEW COMPARISON:  01/04/2020, 12/30/2019, 04/13/2018 FINDINGS: Hardware in the cervical spine. Cardiomegaly with mild central congestion. Mild diffuse interstitial and ground-glass opacity. Aortic atherosclerosis. No pneumothorax. IMPRESSION: Cardiomegaly with slight central congestion and mild diffuse interstitial and ground-glass opacities suspicious for mild edema. Electronically Signed   By: Donavan Foil M.D.   On: 01/23/2020 20:10   DG Chest Port 1 View  Result Date: 01/04/2020 CLINICAL DATA:  Preop for spinal surgery. EXAM: PORTABLE CHEST 1 VIEW COMPARISON:  12/30/2019 FINDINGS: Borderline cardiac enlargement appears stable. Mild tortuosity and calcification of the thoracic aorta. The lungs are clear. No pleural effusions. No worrisome pulmonary lesions. The bony thorax is intact. IMPRESSION: No acute cardiopulmonary findings. Electronically Signed   By: Marijo Sanes M.D.   On: 01/04/2020 10:04   DG Chest Port 1 View  Result Date: 12/30/2019 CLINICAL DATA:  Hypoxia EXAM: PORTABLE CHEST 1 VIEW COMPARISON:  Portable exam 1531 hours compared to earlier two-view exam of 1335 hours and to 06/05/2019 FINDINGS: Enlargement of cardiac silhouette with pulmonary vascular congestion. Mediastinal contours normal. Slight accentuation of interstitial markings, chronic. No definite pulmonary infiltrate, pleural effusion or pneumothorax. Osseous structures unremarkable. IMPRESSION: Enlargement of cardiac silhouette with pulmonary vascular congestion. Slight chronic accentuation of interstitial markings without definite acute infiltrate. Electronically Signed   By: Lavonia Dana M.D.   On: 12/30/2019 15:41   DG C-Arm 1-60 Min  Result Date: 01/04/2020 CLINICAL DATA:  Anterior cervical decompression/discectomy. Fusion C5 through C7. EXAM: CERVICAL SPINE -  2-3 VIEW; DG C-ARM 1-60 MIN COMPARISON:  None. FINDINGS: Three fluoroscopic spot views obtained in the operating room in frontal and lateral projections. Anterior fusion with interbody spacers at C5 through C7. Fluoroscopy time reported 2 minutes 12 seconds. IMPRESSION: Fluoroscopic spot views after C5-C7 anterior fusion. Electronically Signed   By: Keith Rake M.D.   On: 01/04/2020 14:30    Microbiology: Recent Results (from the past 240 hour(s))  SARS Coronavirus 2 by RT PCR (hospital order, performed in [facility] hospital lab) Nasopharyngeal Nasopharyngeal Swab     Status: None   Collection Time: 01/23/20  8:32 PM   Specimen: Nasopharyngeal Swab  Result Value Ref Range Status   SARS Coronavirus 2 NEGATIVE NEGATIVE Final    Comment: (NOTE) SARS-CoV-2 target nucleic acids are NOT DETECTED. The SARS-CoV-2 RNA is generally detectable in upper and lower respiratory specimens during the acute phase of infection. The lowest concentration of SARS-CoV-2 viral copies this assay can detect is 250 copies / mL. A negative result does not preclude SARS-CoV-2 infection and should not be used as the sole basis for treatment or other patient management decisions.  A negative result may occur with improper specimen collection / handling, submission of specimen other than nasopharyngeal swab, presence of viral mutation(s) within the areas targeted by this assay, and inadequate number of viral copies (<250 copies / mL). A negative result must be combined with clinical observations, patient history, and epidemiological information. Fact Sheet for Patients:   StrictlyIdeas.no Fact Sheet for Healthcare Providers: BankingDealers.co.za This test is not yet approved or cleared  by the [place] FDA and has been authorized for detection and/or diagnosis of SARS-CoV-2 by FDA under an Emergency Use Authorization (EUA).  This EUA will remain in effect  (meaning this test can be used) for the duration of the COVID-19 declaration under Section 564(b)(1) of the Act, 21 U.S.C. section 360bbb-3(b)(1), unless the authorization is terminated or  revoked sooner. Performed at [facility], [place] [address] [place], [place] [address]      Labs: Basic Metabolic Panel: Recent Labs  Lab 01/23/20 1940 01/24/20 0746  NA 136 135  K 5.8* 5.2*  CL 93* 94*  CO2 23 21*  GLUCOSE 91 77  BUN 61* 65*  CREATININE 9.89* 10.64*  CALCIUM 9.6 9.2  PHOS  --  6.0*   Liver Function Tests: Recent Labs  Lab 01/24/20 0746  ALBUMIN 3.2*   No results for input(s): LIPASE, AMYLASE in the last 168 hours. No results for input(s): AMMONIA in the last 168 hours. CBC: Recent Labs  Lab 01/23/20 1940 01/24/20 0735  WBC 4.4 3.3*  HGB 10.0* 8.7*  HCT 31.7* 27.9*  MCV 107.1* 106.5*  PLT 129* 122*   Cardiac Enzymes: No results for input(s): CKTOTAL, CKMB, CKMBINDEX, TROPONINI in the last 168 hours. BNP: BNP (last 3 results) Recent Labs    05/16/19 1211 12/30/19 1543 01/23/20 1945  BNP >4,500.0* >4,500.0* 4,186.6*    ProBNP (last 3 results) No results for input(s): PROBNP in the last 8760 hours.  CBG: No results for input(s): GLUCAP in the last 168 hours.  Principal Problem:   Volume overload Active Problems:   Essential hypertension   Hypertensive urgency   Atypical chest pain   ESRD on dialysis (HCC)   Acute on chronic diastolic CHF (congestive heart failure) (HCC)   Acute respiratory failure with hypoxia (HCC)   Anemia in ESRD (end-stage renal disease) (HCC)   GERD (gastroesophageal reflux disease)   Thoracic ascending aortic aneurysm ([place])   Acute pulmonary edema ([place])   Time coordinating discharge: 38 minutes.  Signed:        Jakyla Reza, DO [facility]  01/24/2020, 3:10 PM

## 2020-01-24 NOTE — Consult Note (Signed)
[facility] Renal Consultation Note  Indication for Consultation:  Management of ESRD/hemodialysis; anemia, hypertension/volume and secondary hyperparathyroidism  HPI: Thomas Robinson is a 59 y.o. male with ESRD 2/2 HTN ,Hd  05/2017,Hyperlipidemia,HO CVA,Severe pulm HTN., R CT  Surgery 10/2018,10/03-06/20 fall @ home "tripped " Fx ribs8,9th, Pulmonary Contusion / noted 4.5 CM Ascending Thoracic Aortic Aneurysm (monitor OP ). Non Compliance with Meds/HD  and  Follow up Visits.   Now admitted with volume overloaded and K  at 5.8. He has had his usual Non compliant history of missing hd recently and signs off early   = 5/22 only 2.02 hr, /5/20 missed , 5/19  Did have full tx but  5/15  Only 2.13 hrs . Describes some malaise and fatigue,  Nausea( no vomitting or diarrhea )  pedal edema ,and sob progressive leading to ER. Also  intermittent chest pain.  Now seen on HD and symptoms resolving   after almost 2 l uf . Noted CE unremarkable initially and in ER was hypoxic 89% on room air, slightly tachypneic, requiring 3 L of oxygen support.       Past Medical History:  Diagnosis Date  . Acute kidney injury superimposed on CKD ([place]) 05/24/2017  . Acute on chronic diastolic CHF (congestive heart failure) ([place]) 04/13/2018  . AKI (acute kidney injury) ([place]) 04/10/2015  . Arthritis   . Asthma   . Asthma, chronic, unspecified asthma severity, with acute exacerbation 10/23/2017  . Atypical chest pain 02/16/2018  . CAP (community acquired pneumonia) 10/23/2017  . Cataract    right - removed by surgery  . Cerebral infarction due to thrombosis of cerebral artery ([place])   . Cerebrovascular accident (CVA) ([place])   . Chronic kidney disease   . CKD (chronic kidney disease), stage V ([place]) 05/24/2017  . Cough    chronic cough  . Dyspnea    "all the time" per patient at PAT 12/30/19  RA sats 87%, no oxygen use  . ESRD (end stage renal disease) on dialysis ([place]) 02/16/2018   Dialysis  T Thus Sat  -  [facility]   . Essential hypertension 04/09/2015  . GERD (gastroesophageal reflux disease) 06/04/2019  . GI bleeding 05/24/2017  . History of completed stroke 2018  . Hyperlipidemia   . Hypertension   . Hypertensive urgency 05/24/2017  . Kidney failure   . Oxygen deficiency 12/30/2019   O2 sats on RA 87% at PAT appt   . Respiratory failure, acute ([place]) 10/23/2017  . Restless leg syndrome   . Rib fractures 06/2019   Right  . Stroke ([place]) 2018  . Symptomatic anemia 05/24/2017  . Thoracic ascending aortic aneurysm ([facility])    4.5 cm 06/04/19 CT    Past Surgical History:  Procedure Laterality Date  . ANTERIOR CERVICAL DECOMP/DISCECTOMY FUSION N/A 01/04/2020   Procedure: ANTERIOR CERVICAL DECOMPRESSION/DISCECTOMY FUSION CERVICAL FIVE THROUGH SEVEN;  Surgeon: Melina Schools, MD;  Location: [facility];  Service: Orthopedics;  Laterality: N/A;  3 hrs  . AV FISTULA PLACEMENT Left 05/28/2017   Procedure: LEFT ARM ARTERIOVENOUS (AV) FISTULA CREATION;  Surgeon: Conrad Lowry, MD;  Location: [facility];  Service: Vascular;  Laterality: Left;  . EYE SURGERY Right 06/02/2019   Cataract removed  . FRACTURE SURGERY    . IR FLUORO GUIDE CV LINE RIGHT  05/25/2017  . IR US GUIDE VASC ACCESS RIGHT  05/25/2017  . REVISON OF ARTERIOVENOUS FISTULA Left 07/18/2019   Procedure: REVISION PLICATION OF RADIOCEPHALIC ARTERIOVENOUS FISTULA LEFT ARM;  Surgeon: Angelia Mould, MD;  Location: [facility] OR;  Service: Vascular;  Laterality: Left;  . RINOPLASTY        Family History  Problem Relation Age of Onset  . Cancer Mother       reports that he has never smoked. He has never used smokeless tobacco. He reports previous alcohol use. He reports that he does not use drugs.  No Known Allergies  Prior to Admission medications   Medication Sig Start Date End Date Taking? Authorizing Provider  amLODipine (NORVASC) 10 MG tablet Take 1 tablet (10 mg total) by mouth daily. 05/18/19  Yes Martyn Malay, MD  metoprolol  tartrate (LOPRESSOR) 100 MG tablet Take 100 mg by mouth 2 (two) times daily.    Yes [provider]  atorvastatin (LIPITOR) 20 MG tablet Take 1 tablet (20 mg total) by mouth daily. Patient not taking: Reported on 01/23/2020 06/10/19 12/30/19  Tobb, Godfrey Pick, DO  carvedilol (COREG) 12.5 MG tablet Take 1 tablet (12.5 mg total) by mouth 2 (two) times daily with a meal. 05/18/19   Martyn Malay, MD  hydrALAZINE (APRESOLINE) 100 MG tablet Take 1 tablet (100 mg total) by mouth every 8 (eight) hours. Patient taking differently: Take 100 mg by mouth 2 (two) times daily.  05/18/19   Martyn Malay, MD  isosorbide mononitrate (IMDUR) 30 MG 24 hr tablet Take 1 tablet (30 mg total) by mouth daily. 07/15/19 12/30/19  Tobb, Kardie, DO  ondansetron (ZOFRAN) 4 MG tablet Take 1 tablet (4 mg total) by mouth every 8 (eight) hours as needed for nausea or vomiting. Patient not taking: Reported on 01/23/2020 01/05/20   Melina Schools, MD  sevelamer (RENAGEL) 800 MG tablet Take 800 mg by mouth daily.  01/25/18   [provider]     Anti-infectives (From admission, onward)   None      Results for orders placed or performed during the hospital encounter of 01/23/20 (from the past 48 hour(s))  Basic metabolic panel     Status: Abnormal   Collection Time: 01/23/20  7:40 PM  Result Value Ref Range   Sodium 136 135 - 145 mmol/L   Potassium 5.8 (H) 3.5 - 5.1 mmol/L   Chloride 93 (L) 98 - 111 mmol/L   CO2 23 22 - 32 mmol/L   Glucose, Bld 91 70 - 99 mg/dL    Comment: Glucose reference range applies only to samples taken after fasting for at least 8 hours.   BUN 61 (H) 6 - 20 mg/dL   Creatinine, Ser 9.89 (H) 0.61 - 1.24 mg/dL   Calcium 9.6 8.9 - 10.3 mg/dL   GFR calc non Af Amer 5 (L) >60 mL/min   GFR calc Af Amer 6 (L) >60 mL/min   Anion gap 20 (H) 5 - 15    Comment: Performed at [facility] [address] [place], [place] [address]  CBC     Status: Abnormal   Collection Time: 01/23/20  7:40 PM   Result Value Ref Range   WBC 4.4 4.0 - 10.5 K/uL   RBC 2.96 (L) 4.22 - 5.81 MIL/uL   Hemoglobin 10.0 (L) 13.0 - 17.0 g/dL   HCT 31.7 (L) 39.0 - 52.0 %   MCV 107.1 (H) 80.0 - 100.0 fL   MCH 33.8 26.0 - 34.0 pg   MCHC 31.5 30.0 - 36.0 g/dL   RDW 15.9 (H) 11.5 - 15.5 %   Platelets 129 (L) 150 - 400 K/uL  nRBC 0.0 0.0 - 0.2 %    Comment: Performed at [facility], [place] [address] [place], [place] [address]  Troponin I (High Sensitivity)     Status: Abnormal   Collection Time: 01/23/20  7:40 PM  Result Value Ref Range   Troponin I (High Sensitivity) 38 (H) <18 ng/L    Comment: (NOTE) Elevated high sensitivity troponin I (hsTnI) values and significant  changes across serial measurements may suggest ACS but many other  chronic and acute conditions are known to elevate hsTnI results.  Refer to the "Links" section for chest pain algorithms and additional  guidance. Performed at [facility], [place] [address], [place], [place] [address]   Brain natriuretic peptide     Status: Abnormal   Collection Time: 01/23/20  7:45 PM  Result Value Ref Range   B Natriuretic Peptide 4,186.6 (H) 0.0 - 100.0 pg/mL    Comment: Performed at [facility] [address] [place], [place] [address]  SARS Coronavirus 2 by RT PCR (hospital order, performed in [facility] hospital lab) Nasopharyngeal Nasopharyngeal Swab     Status: None   Collection Time: 01/23/20  8:32 PM   Specimen: Nasopharyngeal Swab  Result Value Ref Range   SARS Coronavirus 2 NEGATIVE NEGATIVE    Comment: (NOTE) SARS-CoV-2 target nucleic acids are NOT DETECTED. The SARS-CoV-2 RNA is generally detectable in upper and lower respiratory specimens during the acute phase of infection. The lowest concentration of SARS-CoV-2 viral copies this assay can detect is 250 copies / mL. A negative result does not preclude SARS-CoV-2 infection and should not be used as the sole basis for treatment or other patient management  decisions.  A negative result may occur with improper specimen collection / handling, submission of specimen other than nasopharyngeal swab, presence of viral mutation(s) within the areas targeted by this assay, and inadequate number of viral copies (<250 copies / mL). A negative result must be combined with clinical observations, patient history, and epidemiological information. Fact Sheet for Patients:   StrictlyIdeas.no Fact Sheet for Healthcare Providers: BankingDealers.co.za This test is not yet approved or cleared  by the [place] FDA and has been authorized for detection and/or diagnosis of SARS-CoV-2 by FDA under an Emergency Use Authorization (EUA).  This EUA will remain in effect (meaning this test can be used) for the duration of the COVID-19 declaration under Section 564(b)(1) of the Act, 21 U.S.C. section 360bbb-3(b)(1), unless the authorization is terminated or revoked sooner. Performed at [facility], [place] [address], [place], [place] [address]   Troponin I (High Sensitivity)     Status: Abnormal   Collection Time: 01/23/20 10:30 PM  Result Value Ref Range   Troponin I (High Sensitivity) 31 (H) <18 ng/L    Comment: (NOTE) Elevated high sensitivity troponin I (hsTnI) values and significant  changes across serial measurements may suggest ACS but many other  chronic and acute conditions are known to elevate hsTnI results.  Refer to the "Links" section for chest pain algorithms and additional  guidance. Performed at [facility], [place] [address], [place] [address]   CBC     Status: Abnormal   Collection Time: 01/24/20  7:35 AM  Result Value Ref Range   WBC 3.3 (L) 4.0 - 10.5 K/uL   RBC 2.62 (L) 4.22 - 5.81 MIL/uL   Hemoglobin 8.7 (L) 13.0 - 17.0 g/dL   HCT 27.9 (L) 39.0 - 52.0 %   MCV  106.5 (H) 80.0 - 100.0 fL   MCH 33.2 26.0 - 34.0 pg   MCHC 31.2 30.0 - 36.0 g/dL   RDW 15.8 (H) 11.5 - 15.5 %    Platelets 122 (L) 150 - 400 K/uL    Comment: Immature Platelet Fraction may be clinically indicated, consider ordering this additional test [id number]    nRBC 0.0 0.0 - 0.2 %    Comment: Performed at [facility] [address] [place], [place] [address]    ROS: see hpi   Physical Exam: Vitals:   01/24/20 0730 01/24/20 0745  BP: (!) 177/78 (!) 174/80  Pulse: (!) 58 60  Resp:    Temp:    SpO2:       General: thin alert small framed Male on HD was sleeping  Awoken  in no distress  HEENT: [place] , not icteric , MMM Neck: supple, no jvd Heart: rare irreg , 1/6 sem , no rub appreciated  Lungs: Now CTA  After 2 l hd uf / nonlabored  Abdomen: BS pos , soft , NT, ND  No ascited  Extremities: Bilat 1+ pedal edema Skin:  No overt rash  Or pedal ulcers  Neuro: alert OX3 ,moves all extremities no overt acute focal defects  Dialysis Access: patent on HD Lfa avf   Dialysis Orders:  [facility] T,Th,S 3:45 hrs 180NRe 350/800 59kg 2.0 K/2.0 Ca UFP 4  -Heparin 1800 units IV TIW -Mircera 159 mcg IV q 2 weeks (last dose 01/14/20 (last HGB 11.0   01/14/20) -Venofer 50 mg IV weekly -Hectorol 74mcg IV    Assessment/Plan 1. Volume overload/ pulm edema 2/2 missed Hd = hd today  Fu wts  bp  2. Hyperkalemia  K 5.8  = hd and fu labs   3. ESRD -  HD on TTS  4. Hypertension/volume  - BP ^ in ER with missed hd and ?? bp med compliance, as op bp chronically elevated pre hd and improves  With uf / fu bp with hd and need for bp meds 5. Anemia  - hgb 8.7 ESA last  On 5/15 due 5/29 , fe q thurs hd  6. Metabolic bone disease -  Iv hec  On hd , binders with meals  7. Nutrition -renal  8. CHRONIC noncompliance with HD attendance and sign off early= council   Again  about risk of CVA and Aneurysmal enlargement  With htn and missed hd as [facility]     Ernest Haber, PA-C [facility] [phone] 01/24/2020, 8:23 AM

## 2020-01-25 ENCOUNTER — Telehealth: Payer: Self-pay

## 2020-01-25 NOTE — Telephone Encounter (Signed)
Transition Care Management Follow-up Telephone Call Date of discharge and from where: 01/24/2020, [facility].  Left AMA.  Call placed to patient # [phone], message left with call back request to this CM.  Needs to schedule follow up appointment

## 2020-01-26 ENCOUNTER — Telehealth: Payer: Self-pay

## 2020-01-26 NOTE — Telephone Encounter (Signed)
Transition Care Management Follow-up Telephone Call Attempt # 2  Date of discharge and from where: 01/24/2020, [facility].  Left AMA.  Call placed to patient # [phone], message left with call back request to this CM.  Letter also sent to patient requesting he call the clinic to schedule a follow up appointment.

## 2020-02-07 IMAGING — CR DG CHEST 2V
3 series · 3 of 3 positions shown · non-contrast
Comparison: 05/16/2019

CLINICAL DATA: Trip and fall, right-sided rib pain

EXAM:
CHEST - 2 VIEW

[w chest lat]
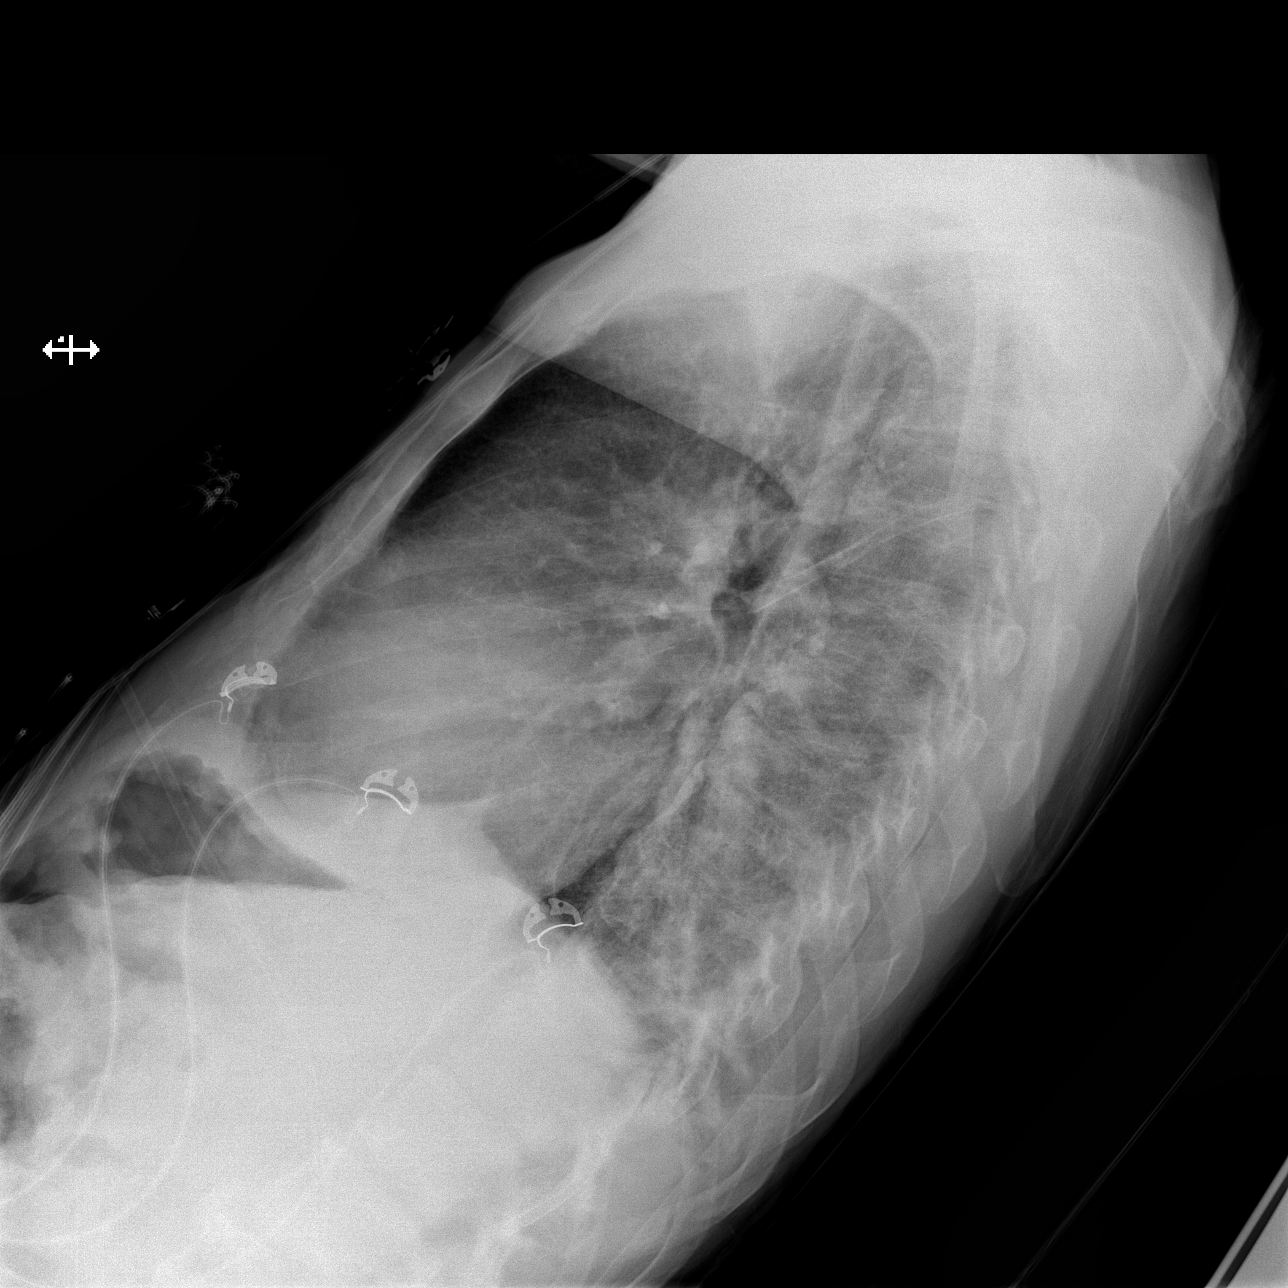

[x chest ap (1 of 2)]
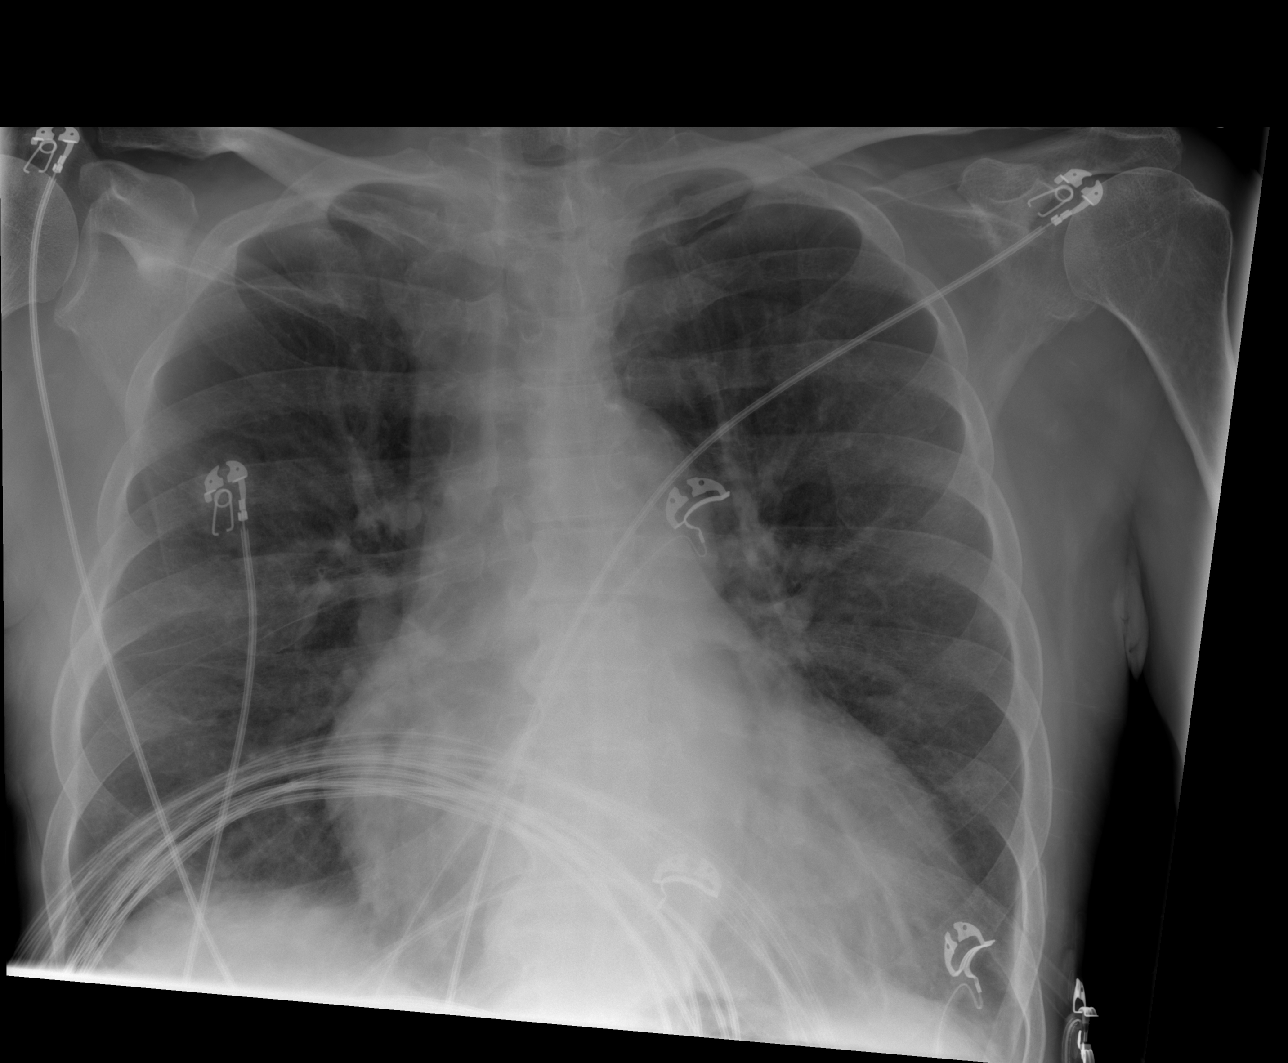

[x chest ap (2 of 2)]
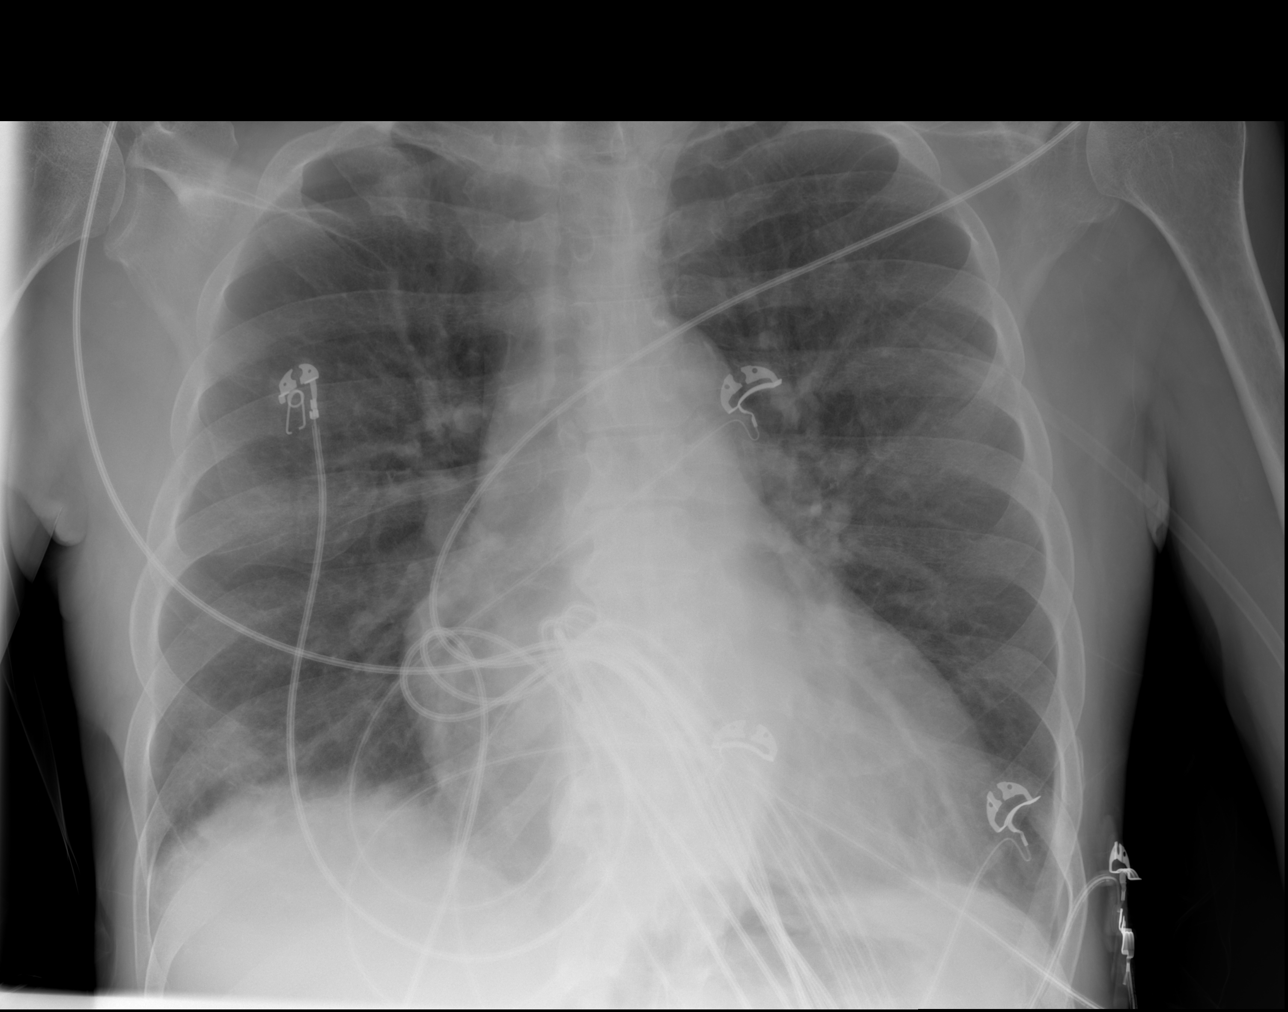

[3 of 3 positions shown; findings below may reference images not displayed]

FINDINGS: Cardiomegaly. Pulmonary vascular prominence without overt edema.
There is a minimally displaced fracture of the posterior right sixth
rib.
IMPRESSION: 1. There is a minimally displaced fracture of the posterior right
sixth rib. No pneumothorax.

2. Cardiomegaly and pulmonary vascular prominence without overt
pulmonary edema. No focal airspace opacity.

## 2020-02-07 IMAGING — CT CT CHEST W/O CM
2 of 4 series · 12 of 36 positions shown, 15 images · non-contrast
Comparison: Chest radiograph from earlier today.

CLINICAL DATA: Trip and fall with right sided injury against the
corner of [REDACTED]. Right sided pain. Back pain.

EXAM:
CT CHEST, ABDOMEN AND PELVIS WITHOUT CONTRAST
TECHNIQUE: Multidetector CT imaging of the chest, abdomen and pelvis was
performed following the standard protocol without IV contrast.

[Series 2: cap w/o · axial · non-contrast · 0.68mm/px · z∈[+1001,+1541]mm · 9 of 130 slices shown, 12 images]
[im 11/130  mediastinal]
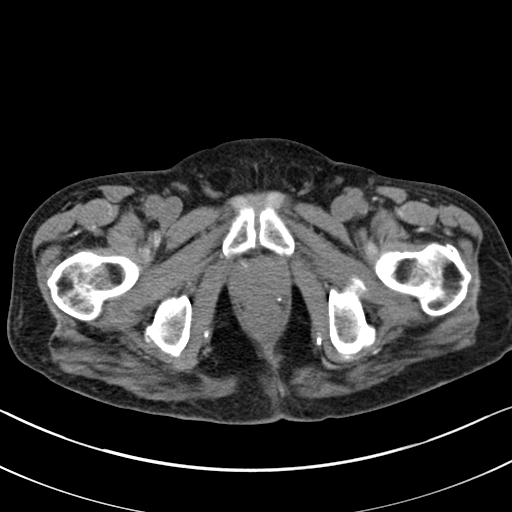
[im 11/130  lung]
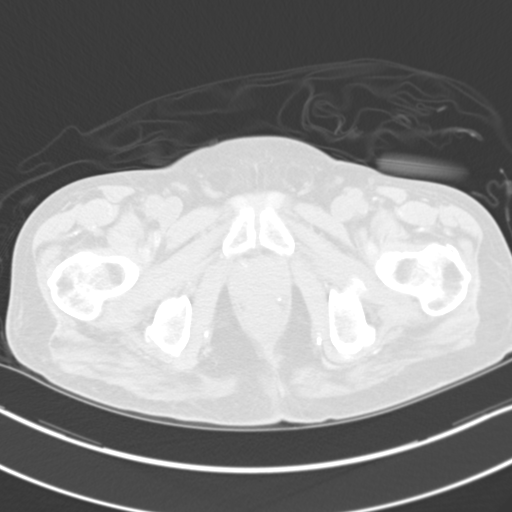
[im 22/130  lung]
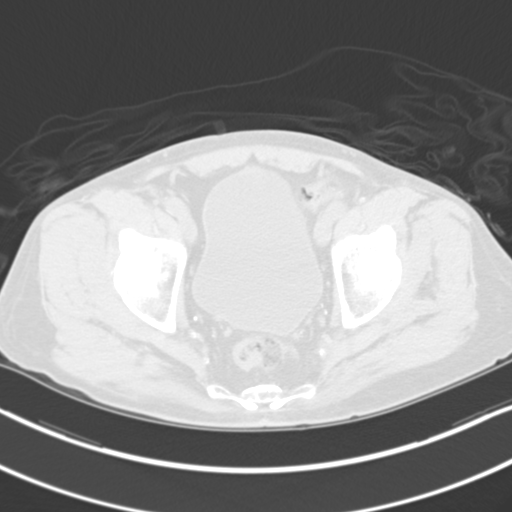
[im 44/130  lung]
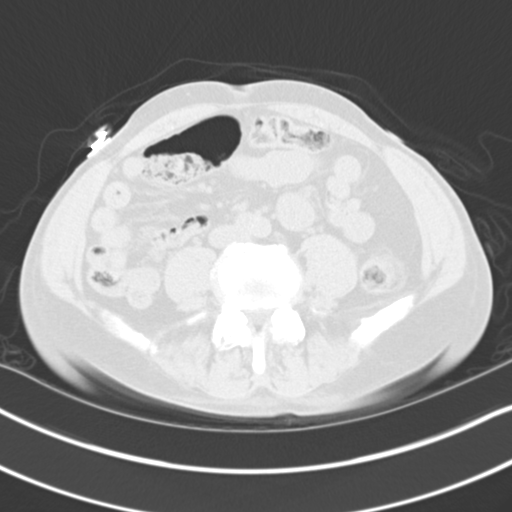
[im 54/130  lung]
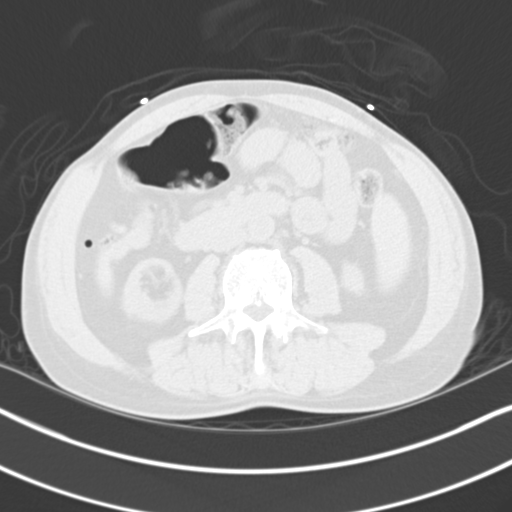
[im 65/130  mediastinal]
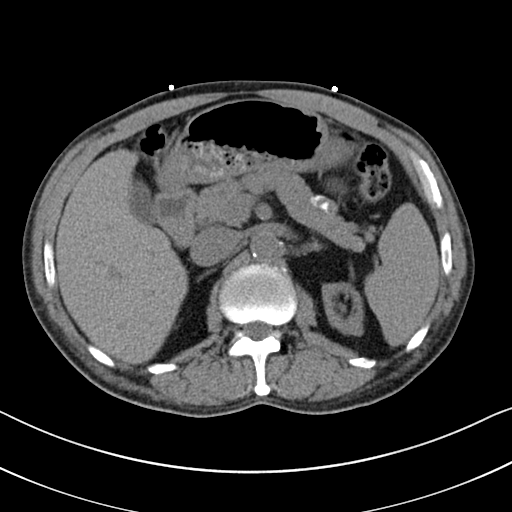
[im 65/130  lung]
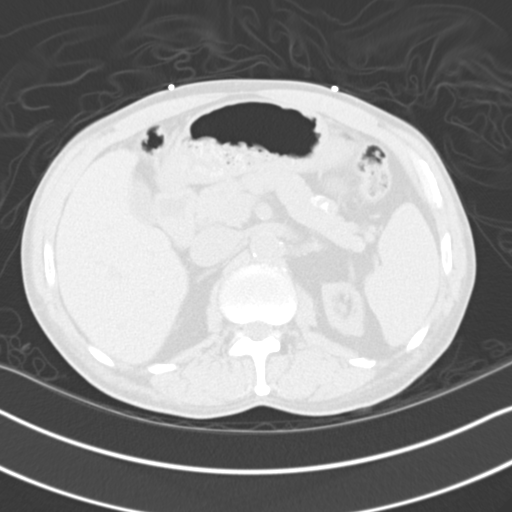
[im 76/130  lung]
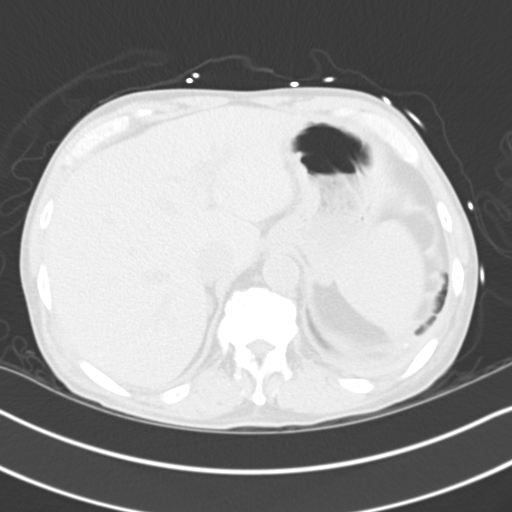
[im 87/130  lung]
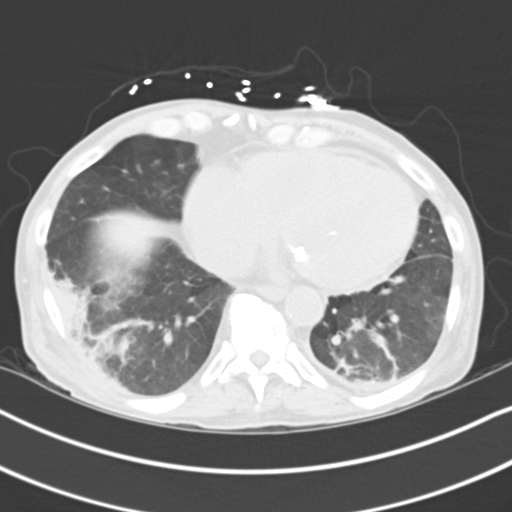
[im 108/130  lung]
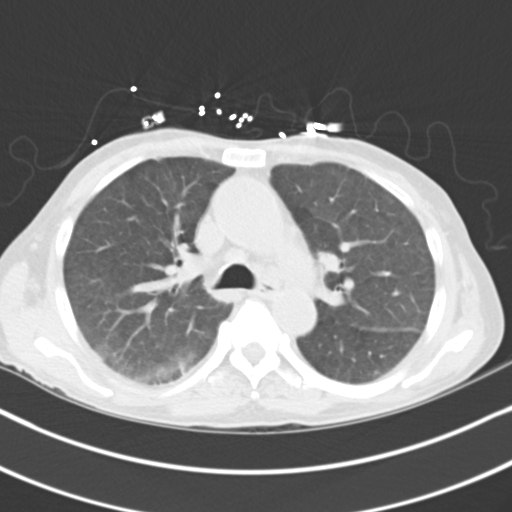
[im 119/130  mediastinal]
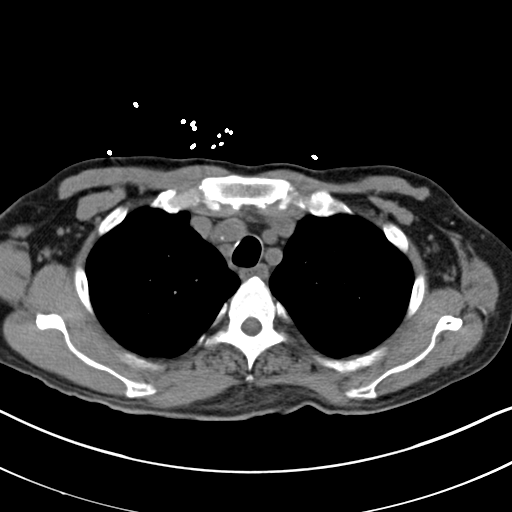
[im 119/130  lung]
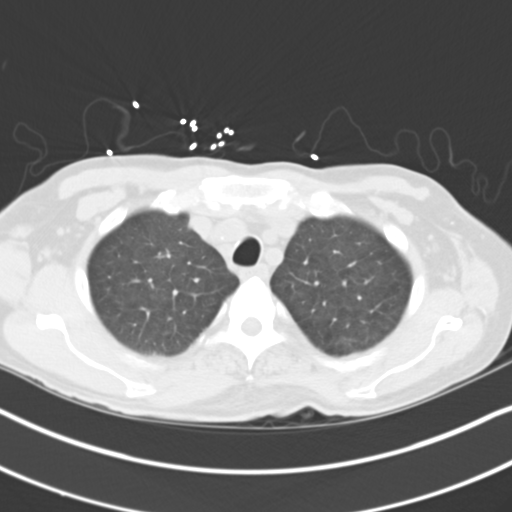

[Series 5: coronals · coronal · 0.67mm/px · 3 of 121 slices shown]
[im 25/121  lung]
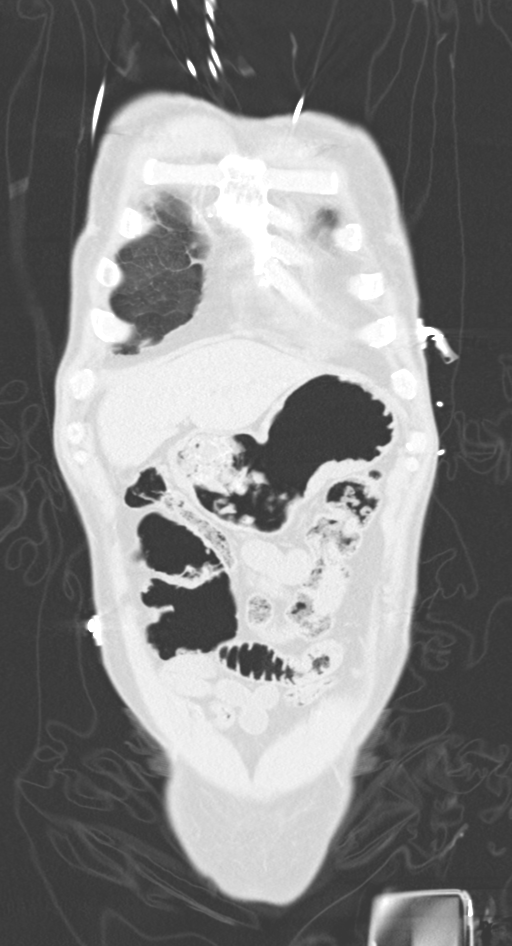
[im 49/121  lung]
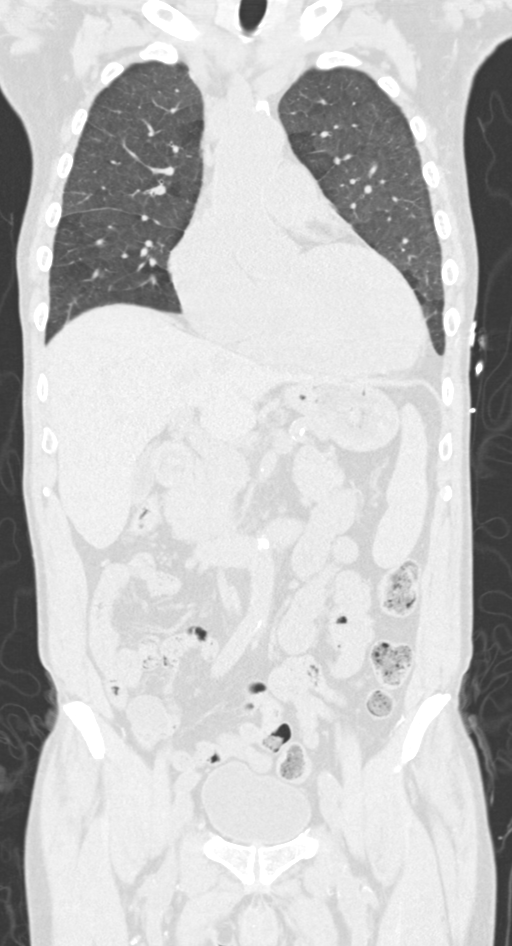
[im 73/121  lung]
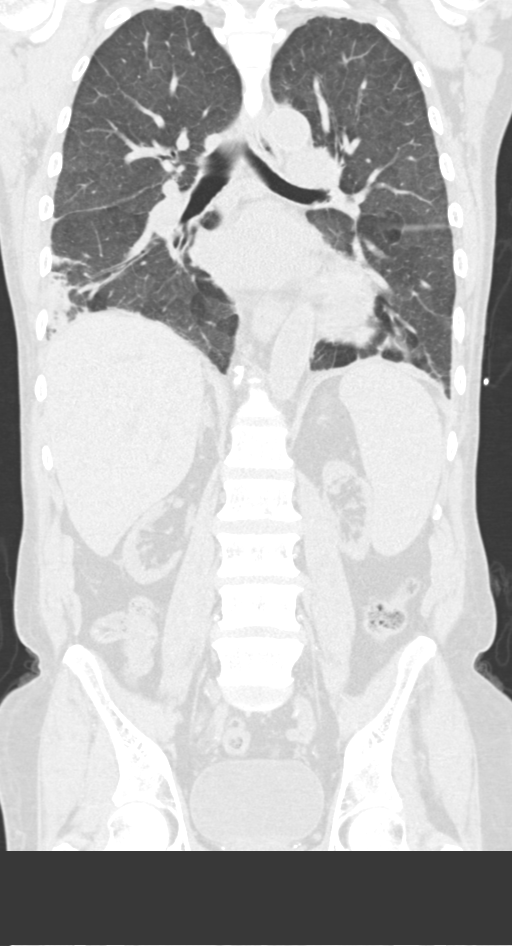

[12 of 36 positions shown; findings below may reference images not displayed]

FINDINGS: CT CHEST FINDINGS

Cardiovascular: Mild cardiomegaly. No significant pericardial
effusion/thickening. Left anterior descending and left circumflex
coronary atherosclerosis. Atherosclerotic thoracic aorta with 4.5 cm
ascending thoracic aortic aneurysm. No evidence of acute intramural
hematoma in the thoracic aorta. Top-normal caliber main pulmonary
artery (3.2 cm diameter).

Mediastinum/Nodes: Hypodense 1.0 cm posterior left thyroid lobe
nodule. Unremarkable esophagus. No pathologically enlarged axillary,
mediastinal or hilar lymph nodes, noting limited sensitivity for the
detection of hilar adenopathy on this noncontrast study.

Lungs/Pleura: No pneumothorax. Minimal smooth with basilar left
pleural thickening with trace dependent left pleural effusion. No
right pleural effusion. Diffuse bronchial wall thickening. Patchy
consolidation throughout peripheral basilar right lower lobe.
Scattered parenchymal bands in basilar left lower lobe. Mosaic
attenuation in both lungs. Mild diffuse interlobular septal
thickening. No lung masses or significant pulmonary nodules. No
central airway stenoses.

Musculoskeletal: No aggressive appearing focal osseous lesions.
Nondisplaced acute posterolateral right eighth and posterior right
ninth rib fractures. Moderate thoracic spondylosis.

CT ABDOMEN PELVIS FINDINGS

Hepatobiliary: Normal size liver. No liver mass. No appreciable
liver laceration on this noncontrast scan. Normal gallbladder with
no radiopaque cholelithiasis. No biliary ductal dilatation.

Pancreas: Normal, with no mass or duct dilation.

Spleen: Normal size. No mass.

Adrenals/Urinary Tract: Normal adrenals. Symmetric severe bilateral
renal cortical atrophy. No renal stones. No hydronephrosis.
Scattered subcentimeter hypodense right renal cortical lesions are
too small to characterize and require no follow-up. No contour
deforming left renal lesions. Normal bladder.

Stomach/Bowel: Normal non-distended stomach. Normal caliber small
bowel with no small bowel wall thickening. Normal appendix. Normal
large bowel with no diverticulosis, large bowel wall thickening or
pericolonic fat stranding.

Vascular/Lymphatic: Atherosclerotic nonaneurysmal abdominal aorta.
No pathologically enlarged lymph nodes in the abdomen or pelvis.

Reproductive: Top-normal size prostate.

Other: No pneumoperitoneum, ascites or focal fluid collection. Small
fat containing umbilical hernia.

Musculoskeletal: No aggressive appearing focal osseous lesions. No
fracture in the abdomen or pelvis. Moderate lumbar spondylosis.
IMPRESSION: 1. Acute posterior right eighth and ninth rib fractures. No
pneumothorax.
2. Patchy consolidation in the peripheral right lower lobe,
compatible with pulmonary contusion.
3. Mild cardiomegaly.  Two-vessel coronary atherosclerosis.
4. Mild diffuse interlobular septal thickening, suggesting mild
pulmonary edema.
5. Mosaic attenuation throughout both lungs, a nonspecific finding
that can be due to air trapping from small airways disease or mosaic
perfusion from pulmonary vascular disease.
6. Ascending thoracic aortic 4.5 cm aneurysm. Ascending thoracic
aortic aneurysm. Recommend semi-annual imaging followup by CTA or
MRA and referral to cardiothoracic surgery if not already obtained.
This recommendation follows 7232
ACCF/AHA/AATS/ACR/ASA/SCA/[place]/NAKANI/LAX/DANGI Guidelines for the
Diagnosis and Management of Patients With Thoracic Aortic Disease.
Circulation. 7232; 121: E266-e369. Aortic aneurysm NOS
([id number].U).
7. No evidence of acute traumatic injury in the abdomen or pelvis on
this noncontrast scan.

## 2020-02-08 ENCOUNTER — Observation Stay (HOSPITAL_COMMUNITY)
Admission: EM | Admit: 2020-02-08 | Discharge: 2020-02-09 | Discharge status: Against Medical Advice | Payer: Medicare Other | Attending: Internal Medicine | Admitting: Internal Medicine

## 2020-02-08 ENCOUNTER — Other Ambulatory Visit: Payer: Self-pay

## 2020-02-08 ENCOUNTER — Encounter (HOSPITAL_COMMUNITY): Payer: Self-pay

## 2020-02-08 ENCOUNTER — Emergency Department (HOSPITAL_COMMUNITY): Payer: Medicare Other

## 2020-02-08 DIAGNOSIS — Z9841 Cataract extraction status, right eye: Secondary | ICD-10-CM | POA: Insufficient documentation

## 2020-02-08 DIAGNOSIS — Z20822 Contact with and (suspected) exposure to covid-19: Secondary | ICD-10-CM | POA: Diagnosis not present

## 2020-02-08 DIAGNOSIS — I712 Thoracic aortic aneurysm, without rupture: Secondary | ICD-10-CM | POA: Diagnosis not present

## 2020-02-08 DIAGNOSIS — D631 Anemia in chronic kidney disease: Secondary | ICD-10-CM | POA: Diagnosis not present

## 2020-02-08 DIAGNOSIS — J811 Chronic pulmonary edema: Secondary | ICD-10-CM | POA: Diagnosis not present

## 2020-02-08 DIAGNOSIS — Z91158 Patient's noncompliance with renal dialysis for other reason: Secondary | ICD-10-CM

## 2020-02-08 DIAGNOSIS — G2581 Restless legs syndrome: Secondary | ICD-10-CM | POA: Insufficient documentation

## 2020-02-08 DIAGNOSIS — I132 Hypertensive heart and chronic kidney disease with heart failure and with stage 5 chronic kidney disease, or end stage renal disease: Secondary | ICD-10-CM | POA: Diagnosis not present

## 2020-02-08 DIAGNOSIS — R011 Cardiac murmur, unspecified: Secondary | ICD-10-CM | POA: Insufficient documentation

## 2020-02-08 DIAGNOSIS — K5900 Constipation, unspecified: Secondary | ICD-10-CM | POA: Insufficient documentation

## 2020-02-08 DIAGNOSIS — N2581 Secondary hyperparathyroidism of renal origin: Secondary | ICD-10-CM | POA: Insufficient documentation

## 2020-02-08 DIAGNOSIS — K219 Gastro-esophageal reflux disease without esophagitis: Secondary | ICD-10-CM | POA: Diagnosis not present

## 2020-02-08 DIAGNOSIS — M1990 Unspecified osteoarthritis, unspecified site: Secondary | ICD-10-CM | POA: Insufficient documentation

## 2020-02-08 DIAGNOSIS — I7121 Aneurysm of the ascending aorta, without rupture: Secondary | ICD-10-CM | POA: Diagnosis present

## 2020-02-08 DIAGNOSIS — Z9115 Patient's noncompliance with renal dialysis: Secondary | ICD-10-CM

## 2020-02-08 DIAGNOSIS — I160 Hypertensive urgency: Principal | ICD-10-CM | POA: Diagnosis present

## 2020-02-08 DIAGNOSIS — N186 End stage renal disease: Secondary | ICD-10-CM

## 2020-02-08 DIAGNOSIS — J45909 Unspecified asthma, uncomplicated: Secondary | ICD-10-CM | POA: Diagnosis not present

## 2020-02-08 DIAGNOSIS — Z809 Family history of malignant neoplasm, unspecified: Secondary | ICD-10-CM | POA: Insufficient documentation

## 2020-02-08 DIAGNOSIS — I5032 Chronic diastolic (congestive) heart failure: Secondary | ICD-10-CM | POA: Insufficient documentation

## 2020-02-08 DIAGNOSIS — E785 Hyperlipidemia, unspecified: Secondary | ICD-10-CM | POA: Insufficient documentation

## 2020-02-08 DIAGNOSIS — R0781 Pleurodynia: Secondary | ICD-10-CM | POA: Diagnosis present

## 2020-02-08 DIAGNOSIS — J9601 Acute respiratory failure with hypoxia: Secondary | ICD-10-CM | POA: Diagnosis not present

## 2020-02-08 DIAGNOSIS — Z8673 Personal history of transient ischemic attack (TIA), and cerebral infarction without residual deficits: Secondary | ICD-10-CM | POA: Diagnosis not present

## 2020-02-08 DIAGNOSIS — Z981 Arthrodesis status: Secondary | ICD-10-CM | POA: Insufficient documentation

## 2020-02-08 DIAGNOSIS — E8770 Fluid overload, unspecified: Secondary | ICD-10-CM | POA: Diagnosis present

## 2020-02-08 DIAGNOSIS — Z992 Dependence on renal dialysis: Secondary | ICD-10-CM | POA: Insufficient documentation

## 2020-02-08 DIAGNOSIS — I10 Essential (primary) hypertension: Secondary | ICD-10-CM | POA: Diagnosis not present

## 2020-02-08 DIAGNOSIS — Z79899 Other long term (current) drug therapy: Secondary | ICD-10-CM | POA: Diagnosis not present

## 2020-02-08 DIAGNOSIS — Z7982 Long term (current) use of aspirin: Secondary | ICD-10-CM | POA: Diagnosis not present

## 2020-02-08 HISTORY — DX: Patient's noncompliance with renal dialysis: Z91.15

## 2020-02-08 HISTORY — DX: Patient's noncompliance with renal dialysis for other reason: Z91.158

## 2020-02-08 LAB — CBC WITH DIFFERENTIAL/PLATELET
Abs Immature Granulocytes: 0.02 10*3/uL (ref 0.00–0.07)
Basophils Absolute: 0.1 10*3/uL (ref 0.0–0.1)
Basophils Relative: 1 %
Eosinophils Absolute: 0.1 10*3/uL (ref 0.0–0.5)
Eosinophils Relative: 2 %
HCT: 25.8 % — ABNORMAL LOW (ref 39.0–52.0)
Hemoglobin: 8.3 g/dL — ABNORMAL LOW (ref 13.0–17.0)
Immature Granulocytes: 0 %
Lymphocytes Relative: 6 %
Lymphs Abs: 0.4 10*3/uL — ABNORMAL LOW (ref 0.7–4.0)
MCH: 33.6 pg (ref 26.0–34.0)
MCHC: 32.2 g/dL (ref 30.0–36.0)
MCV: 104.5 fL — ABNORMAL HIGH (ref 80.0–100.0)
Monocytes Absolute: 0.3 10*3/uL (ref 0.1–1.0)
Monocytes Relative: 4 %
Neutro Abs: 5.1 10*3/uL (ref 1.7–7.7)
Neutrophils Relative %: 87 %
Platelets: 128 10*3/uL — ABNORMAL LOW (ref 150–400)
RBC: 2.47 MIL/uL — ABNORMAL LOW (ref 4.22–5.81)
RDW: 13.7 % (ref 11.5–15.5)
WBC: 6 10*3/uL (ref 4.0–10.5)
nRBC: 0 % (ref 0.0–0.2)

## 2020-02-08 LAB — TROPONIN I (HIGH SENSITIVITY)
Troponin I (High Sensitivity): 73 ng/L — ABNORMAL HIGH (ref <18)
Troponin I (High Sensitivity): 76 ng/L — ABNORMAL HIGH (ref <18)

## 2020-02-08 LAB — COMPREHENSIVE METABOLIC PANEL
ALT: 11 U/L (ref 0–44)
AST: 18 U/L (ref 15–41)
Albumin: 3.7 g/dL (ref 3.5–5.0)
Alkaline Phosphatase: 75 U/L (ref 38–126)
Anion gap: 17 — ABNORMAL HIGH (ref 5–15)
BUN: 62 mg/dL — ABNORMAL HIGH (ref 6–20)
CO2: 24 mmol/L (ref 22–32)
Calcium: 9.5 mg/dL (ref 8.9–10.3)
Chloride: 98 mmol/L (ref 98–111)
Creatinine, Ser: 8.82 mg/dL — ABNORMAL HIGH (ref 0.61–1.24)
GFR calc Af Amer: 7 mL/min — ABNORMAL LOW (ref >60)
GFR calc non Af Amer: 6 mL/min — ABNORMAL LOW (ref >60)
Glucose, Bld: 99 mg/dL (ref 70–99)
Potassium: 4.8 mmol/L (ref 3.5–5.1)
Sodium: 139 mmol/L (ref 135–145)
Total Bilirubin: 1.1 mg/dL (ref 0.3–1.2)
Total Protein: 7 g/dL (ref 6.5–8.1)

## 2020-02-08 LAB — BRAIN NATRIURETIC PEPTIDE: B Natriuretic Peptide: 4270.9 pg/mL — ABNORMAL HIGH (ref 0.0–100.0)

## 2020-02-08 LAB — SARS CORONAVIRUS 2 BY RT PCR (HOSPITAL ORDER, PERFORMED IN ~~LOC~~ HOSPITAL LAB): SARS Coronavirus 2: NEGATIVE

## 2020-02-08 IMAGING — DX DG CHEST 1V PORT
1 series · 1 of 1 positions shown · non-contrast
Comparison: Chest radiograph from one day prior.

CLINICAL DATA: Contusion

EXAM:
PORTABLE CHEST 1 VIEW

[chest]
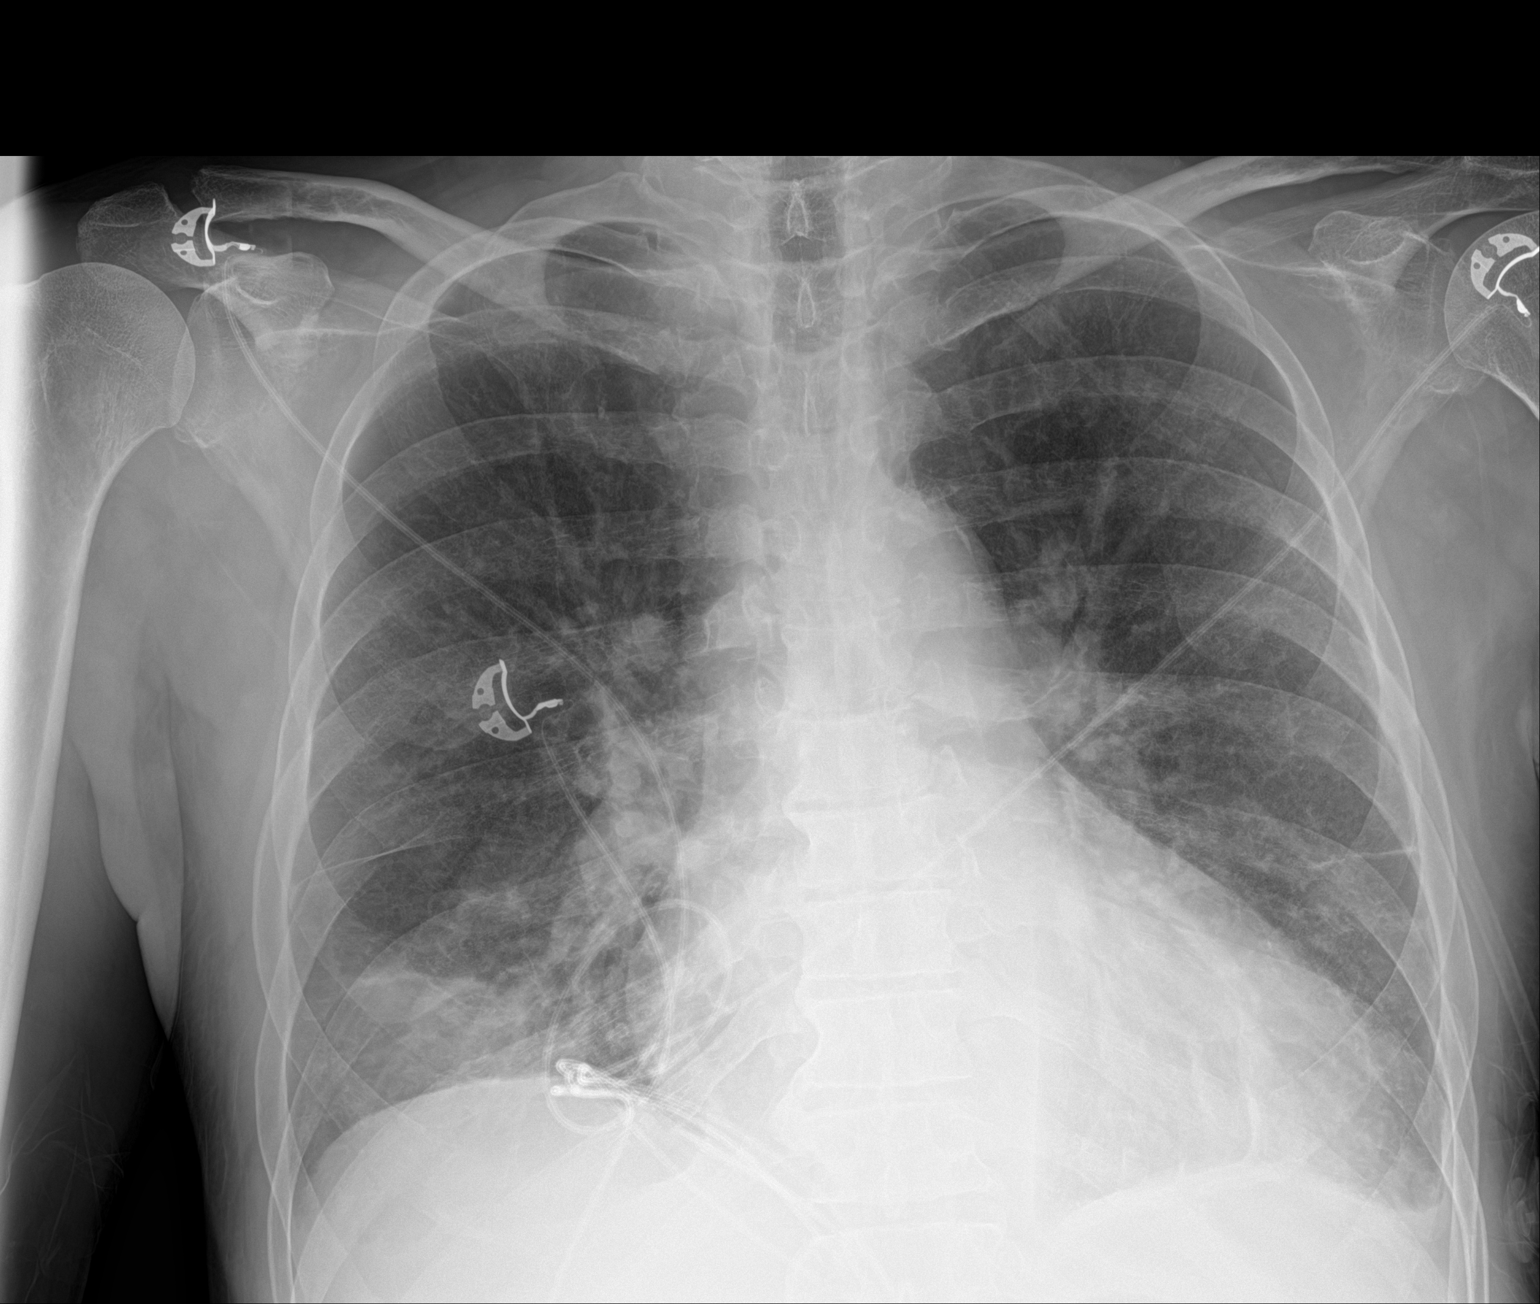

[1 of 1 positions shown; findings below may reference images not displayed]

FINDINGS: Stable cardiomediastinal silhouette with top-normal heart size. No
pneumothorax. No pleural effusion. Patchy opacity at the right lung
base is slightly increased. Stable curvilinear left lung base
opacities. No pulmonary edema.
IMPRESSION: 1. No pneumothorax.
2. Patchy right lung base opacity, slightly increased, favor a
combination of contusion and atelectasis.
3. Stable left lung base scarring versus atelectasis.

## 2020-02-08 MED ORDER — CINACALCET HCL 30 MG PO TABS
60.0000 mg | ORAL_TABLET | Freq: Every day | ORAL | Status: DC
  Filled 2020-02-08: qty 2

## 2020-02-08 MED ORDER — ONDANSETRON HCL 4 MG PO TABS: 4.0000 mg | ORAL_TABLET | Freq: Four times a day (QID) | ORAL | Status: DC | PRN

## 2020-02-08 MED ORDER — CHLORHEXIDINE GLUCONATE CLOTH 2 % EX PADS: 6.0000 | MEDICATED_PAD | Freq: Every day | CUTANEOUS | Status: DC

## 2020-02-08 MED ORDER — IOHEXOL 350 MG/ML SOLN
100.0000 mL | Freq: Once | INTRAVENOUS | Status: AC | PRN
  Administered 2020-02-08: 100 mL via INTRAVENOUS

## 2020-02-08 MED ORDER — HEPARIN SODIUM (PORCINE) 5000 UNIT/ML IJ SOLN
5000.0000 [IU] | Freq: Three times a day (TID) | INTRAMUSCULAR | Status: DC
  Administered 2020-02-09: 5000 [IU] via SUBCUTANEOUS
  Filled 2020-02-08: qty 1

## 2020-02-08 MED ORDER — METOPROLOL TARTRATE 100 MG PO TABS: 100.0000 mg | ORAL_TABLET | Freq: Two times a day (BID) | ORAL | Status: DC

## 2020-02-08 MED ORDER — SEVELAMER CARBONATE 800 MG PO TABS: 800.0000 mg | ORAL_TABLET | Freq: Every day | ORAL | Status: DC

## 2020-02-08 MED ORDER — ACETAMINOPHEN 325 MG PO TABS: 650.0000 mg | ORAL_TABLET | Freq: Four times a day (QID) | ORAL | Status: DC | PRN

## 2020-02-08 MED ORDER — GABAPENTIN 300 MG PO CAPS: 600.0000 mg | ORAL_CAPSULE | Freq: Every day | ORAL | Status: DC

## 2020-02-08 MED ORDER — HYDRALAZINE HCL 50 MG PO TABS: 100.0000 mg | ORAL_TABLET | Freq: Three times a day (TID) | ORAL | Status: DC

## 2020-02-08 MED ORDER — HYDRALAZINE HCL 25 MG PO TABS
100.0000 mg | ORAL_TABLET | Freq: Once | ORAL | Status: AC
  Administered 2020-02-08: 100 mg via ORAL
  Filled 2020-02-08: qty 4

## 2020-02-08 MED ORDER — ACETAMINOPHEN 650 MG RE SUPP: 650.0000 mg | Freq: Four times a day (QID) | RECTAL | Status: DC | PRN

## 2020-02-08 MED ORDER — ASPIRIN 81 MG PO CHEW: 324.0000 mg | CHEWABLE_TABLET | Freq: Once | ORAL | Status: DC

## 2020-02-08 MED ORDER — RENA-VITE PO TABS: 1.0000 | ORAL_TABLET | Freq: Every day | ORAL | Status: DC

## 2020-02-08 MED ORDER — ONDANSETRON HCL 4 MG/2ML IJ SOLN: 4.0000 mg | Freq: Four times a day (QID) | INTRAMUSCULAR | Status: DC | PRN

## 2020-02-08 MED ORDER — METOPROLOL TARTRATE 25 MG PO TABS
100.0000 mg | ORAL_TABLET | Freq: Once | ORAL | Status: AC
  Administered 2020-02-08: 100 mg via ORAL
  Filled 2020-02-08: qty 4

## 2020-02-08 NOTE — Consult Note (Signed)
Reason for Consult: Volume overload in patient with end-stage renal disease Referring Physician: Quintella Reichert, MD (EDP)  HPI:  59 year old Caucasian man with past medical history significant for hypertension, dyslipidemia, bronchial asthma, history of CVA, history of cervical spondylitic radiculopathy status post anterior cervical discectomy/fusion of C5-C7 and end-stage renal dialysis.  Presented to the emergency room with progressively worsening shortness of breath and some chest tightness and found to be hypoxemic in the emergency room with chest x-ray showing cardiomegaly and interstitial edema.  He is notoriously nonadherent with hemodialysis and regularly attends 2 out of the 3 prescribed days of which he only had 2 hours of a 3 hours and 45 minutes prescribed dialysis treatment when he last went to dialysis on Tuesday (6/8).  Today started developing increasing work of breathing prompting him to present to the emergency room.  Of note, he was recently in the hospital between 5/24 and 5/25 for a similar reason and left AGAINST MEDICAL ADVICE.  He denies any fever or chills and reports generalized fatigue.  He reports no claudications and has asymmetric leg swelling affecting the right leg more than the left.  He denies any urinary complaints, cough or sputum production.  Hemodialysis prescription: [facility], TTS, 3 hours 45 minutes, 180 dialyzer, 2K/2.0 calcium, BFR 350, DFR 800, UF profile #4, left radiocephalic fistula.  Heparin 1800 unit bolus q. dialysis.  Mircera 200 mcg every 2 weeks, Venofer 50 mg IV q. weekly, Hectorol 3 mcg 3 times weekly  Past Medical History:  Diagnosis Date  . Acute kidney injury superimposed on CKD ([place]) 05/24/2017  . Acute on chronic diastolic CHF (congestive heart failure) ([place]) 04/13/2018  . AKI (acute kidney injury) ([place]) 04/10/2015  . Arthritis   . Asthma   . Asthma, chronic, unspecified asthma severity, with acute exacerbation  10/23/2017  . Atypical chest pain 02/16/2018  . CAP (community acquired pneumonia) 10/23/2017  . Cataract    right - removed by surgery  . Cerebral infarction due to thrombosis of cerebral artery ([place])   . Cerebrovascular accident (CVA) ([place])   . Chronic kidney disease   . CKD (chronic kidney disease), stage V ([place]) 05/24/2017  . Cough    chronic cough  . Dyspnea    "all the time" per patient at PAT 12/30/19  RA sats 87%, no oxygen use  . ESRD (end stage renal disease) on dialysis ([place]) 02/16/2018   Dialysis  T Thus Sat  - [facility]   . Essential hypertension 04/09/2015  . GERD (gastroesophageal reflux disease) 06/04/2019  . GI bleeding 05/24/2017  . History of completed stroke 2018  . Hyperlipidemia   . Hypertension   . Hypertensive urgency 05/24/2017  . Kidney failure   . Oxygen deficiency 12/30/2019   O2 sats on RA 87% at PAT appt   . Respiratory failure, acute ([place]) 10/23/2017  . Restless leg syndrome   . Rib fractures 06/2019   Right  . Stroke ([place]) 2018  . Symptomatic anemia 05/24/2017  . Thoracic ascending aortic aneurysm (HCC)    4.5 cm 06/04/19 CT    Past Surgical History:  Procedure Laterality Date  . ANTERIOR CERVICAL DECOMP/DISCECTOMY FUSION N/A 01/04/2020   Procedure: ANTERIOR CERVICAL DECOMPRESSION/DISCECTOMY FUSION CERVICAL FIVE THROUGH SEVEN;  Surgeon: Melina Schools, MD;  Location: [place];  Service: Orthopedics;  Laterality: N/A;  3 hrs  . AV FISTULA PLACEMENT Left 05/28/2017   Procedure: LEFT ARM ARTERIOVENOUS (AV) FISTULA CREATION;  Surgeon: Conrad Romoland, MD;  Location:  [facility] OR;  Service: Vascular;  Laterality: Left;  . EYE SURGERY Right 06/02/2019   Cataract removed  . FRACTURE SURGERY    . IR FLUORO GUIDE CV LINE RIGHT  05/25/2017  . IR US GUIDE VASC ACCESS RIGHT  05/25/2017  . REVISON OF ARTERIOVENOUS FISTULA Left 07/18/2019   Procedure: REVISION PLICATION OF RADIOCEPHALIC ARTERIOVENOUS FISTULA LEFT ARM;  Surgeon: Angelia Mould, MD;  Location: [place];  Service: Vascular;  Laterality: Left;  . RINOPLASTY      Family History  Problem Relation Age of Onset  . Cancer Mother     Social History:  reports that he has never smoked. He has never used smokeless tobacco. He reports previous alcohol use. He reports that he does not use drugs.  Allergies: No Known Allergies  Medications:  Scheduled: . aspirin  324 mg Oral Once  . [START ON 02/09/2020] Chlorhexidine Gluconate Cloth  6 each Topical Q0600    BMP Latest Ref Rng & Units 02/08/2020 01/24/2020 01/23/2020  Glucose 70 - 99 mg/dL 99 77 91  BUN 6 - 20 mg/dL 62(H) 65(H) 61(H)  Creatinine 0.61 - 1.24 mg/dL 8.82(H) 10.64(H) 9.89(H)  BUN/Creat Ratio 9 - 20 - - -  Sodium 135 - 145 mmol/L 139 135 136  Potassium 3.5 - 5.1 mmol/L 4.8 5.2(H) 5.8(H)  Chloride 98 - 111 mmol/L 98 94(L) 93(L)  CO2 22 - 32 mmol/L 24 21(L) 23  Calcium 8.9 - 10.3 mg/dL 9.5 9.2 9.6   CBC Latest Ref Rng & Units 02/08/2020 01/24/2020 01/23/2020  WBC 4.0 - 10.5 K/uL 6.0 3.3(L) 4.4  Hemoglobin 13.0 - 17.0 g/dL 8.3(L) 8.7(L) 10.0(L)  Hematocrit 39.0 - 52.0 % 25.8(L) 27.9(L) 31.7(L)  Platelets 150 - 400 K/uL 128(L) 122(L) 129(L)     DG Chest Port 1 View  Result Date: 02/08/2020 CLINICAL DATA:  Shortness of breath. EXAM: PORTABLE CHEST 1 VIEW COMPARISON:  Jan 23, 2020 FINDINGS: Mild, diffuse chronic appearing increased lung markings are seen. Mild atelectasis is seen within the bilateral lung bases. There is no evidence of a pleural effusion or pneumothorax. The cardiac silhouette is mildly enlarged and unchanged in size. A radiopaque fusion plate and screws are seen overlying the lower cervical spine. The visualized skeletal structures are unremarkable. IMPRESSION: Stable cardiomegaly with chronic appearing increased lung markings. A mild superimposed component of interstitial edema cannot be excluded. Electronically Signed   By: Virgina Norfolk M.D.   On: 02/08/2020 17:23   CT Angio Chest/Abd/Pel for Dissection W and/or  W/WO  Result Date: 02/08/2020 CLINICAL DATA:  No aortic aneurysm. End-stage renal disease on hemodialysis for 3 years. Chest pain and back pain. Concern for acute aortic dissection. EXAM: CT ANGIOGRAPHY CHEST, ABDOMEN AND PELVIS TECHNIQUE: Non-contrast CT of the chest was initially obtained. Multidetector CT imaging through the chest, abdomen and pelvis was performed using the standard protocol during bolus administration of intravenous contrast. Multiplanar reconstructed images and MIPs were obtained and reviewed to evaluate the vascular anatomy. CONTRAST:  174mL OMNIPAQUE IOHEXOL 350 MG/ML SOLN COMPARISON:  Chest CT 01/23/2020 FINDINGS: CTA CHEST FINDINGS Cardiovascular: Noncontrast imaging of the thorax demonstrates no intramural hematoma within the thoracic aorta. Contrast enhanced imaging demonstrates no dissection of the thoracic aorta. The ascending thoracic aorta is upper limits of normal at 40 mm. Great vessels are normal. Descending thoracic aorta is normal at 21 mm Mediastinum/Nodes: No axillary supraclavicular adenopathy. No new mediastinal hilar adenopathy no pericardial effusion. Esophagus normal. Lungs/Pleura: There is ground-glass opacities in the lower lobes  as well as upper lobes. There is greater density in the posterior aspect of the RIGHT upper lobe (image 61/7). There is a moderate RIGHT effusion. Peribronchial thickening in the lower lobes. The ground-glass opacities which are diffuse are worsened from exam 01/23/2020. RIGHT pleural effusion is also worse. Musculoskeletal: No aggressive osseous lesion healed posterior rib fractures. Review of the MIP images confirms the above findings. CTA ABDOMEN AND PELVIS FINDINGS VASCULAR Aorta: No dissection in the abdominal aorta. Celiac: Widely patent SMA: Widely patent Renals: Single renal artery on the LEFT. Two renal arteries on the RIGHT. IMA: Widely patent Inflow: Normal Veins: Normal Review of the MIP images confirms the above findings.  NON-VASCULAR Hepatobiliary: No focal hepatic lesion. Pancreas: Normal pancreas Spleen: Normal spleen Adrenals/Urinary Tract: Adrenal glands normal. Kidneys are atrophic. No significant renal enhancement on arterial phase. Stomach/Bowel: Stomach, small bowel, appendix, and cecum are normal. The colon and rectosigmoid colon are normal. Lymphatic: No lymphadenopathy Reproductive: Prostate unremarkable Other: No abdominal hernia Musculoskeletal: No aggressive osseous lesion Review of the MIP images confirms the above findings. IMPRESSION: Chest Impression: 1. No aortic dissection. 2. Ascending thoracic aorta borderline aneurysmal. Recommend annual imaging followup by CTA or MRA. This recommendation follows 2010 ACCF/AHA/AATS/ACR/ASA/SCA/SCAI/SIR/STS/SVM Guidelines for the Diagnosis and Management of Patients with Thoracic Aortic Disease. Circulation. 2010; 121: U235-T614. Aortic aneurysm NOS (ICD10-I71.9) 3. Increase in diffuse ground-glass opacities suggest pulmonary edema pattern. Increased RIGHT pleural effusion. Some increased ground-glass focality in the RIGHT lung is favored related to edema however cannot exclude superimposed pulmonary infection. Abdomen / Pelvis Impression: 1. No evidence of aortic dissection. 2. No acute findings in the abdomen pelvis. Electronically Signed   By: Suzy Bouchard M.D.   On: 02/08/2020 18:54    Review of Systems  Constitutional: Positive for fatigue. Negative for chills and fever.  HENT: Negative for ear pain, nosebleeds, sore throat and trouble swallowing.   Eyes: Negative for pain and redness.  Respiratory: Positive for chest tightness and shortness of breath. Negative for cough, choking and wheezing.   Cardiovascular: Positive for leg swelling. Negative for chest pain.       Right ankle  Gastrointestinal: Negative for abdominal pain, diarrhea, nausea and vomiting.  Genitourinary: Negative for dysuria, hematuria and urgency.  Musculoskeletal: Positive for myalgias.  Negative for arthralgias and joint swelling.  Skin: Negative for rash and wound.  Neurological: Positive for weakness. Negative for dizziness and headaches.   Blood pressure (!) 214/90, pulse 69, temperature 97.8 F (36.6 C), temperature source Oral, resp. rate (!) 23, height 5\' 8"  (1.727 m), weight 63.5 kg, SpO2 94 %. Physical Exam  Nursing note and vitals reviewed. Constitutional: He is oriented to person, place, and time. He appears well-developed and well-nourished. No distress.  Appears chronically ill  HENT:  Head: Normocephalic and atraumatic.  Nose: Nose normal.  Mouth/Throat: Oropharynx is clear and moist.  Eyes: Pupils are equal, round, and reactive to light.  Neck: No JVD present.  Cardiovascular: Normal rate and normal heart sounds.  No murmur heard. Respiratory: Effort normal. He has rales.  On oxygen via nasal cannula, poor respiratory excursion  GI: Soft. Bowel sounds are normal. There is no abdominal tenderness. There is no rebound and no guarding.  Musculoskeletal:        General: Edema present.     Cervical back: Normal range of motion and neck supple.     Comments: Trace-1+ right ankle edema, trace left ankle edema.  Left radiocephalic fistula with palpable thrill  Neurological: He is alert  and oriented to person, place, and time.  Skin: Skin is warm and dry. No rash noted. No pallor.   Assessment/Plan: 1.  Volume overload in patient with end-stage renal disease: Secondary to nonadherence with hemodialysis for multiple reasons; will undertake hemodialysis today for ultrafiltration. 2.  End-stage renal disease 3.  Hypertensive urgency: Significantly elevated blood pressures noted in part from volume excess and difficulties with respiration; monitor with hemodialysis. 4.  Anemia of chronic kidney disease: Spotty adherence with dialysis likely leading to staggered ESA dosing.  Denies overt loss. 5.  Secondary hyperparathyroidism: Resume renal diet along with  phosphorus binders 3 times daily before meals and Hectorol with hemodialysis on TTS schedule.  Renai Lopata K. 02/08/2020, 8:23 PM

## 2020-02-08 NOTE — ED Notes (Signed)
Pt requesting something to eat and some ice

## 2020-02-08 NOTE — ED Triage Notes (Signed)
Patietn arrived via [facility] from home stating that he became SOB for the last few days however it has gotten worse. Patient stated he can't walk far and get SOB and his chest started hurting last night. BP elevated with EMS at 170/84.  Patient is a dialysis patient TTS and stated he went yesterday.  EMS gave 4 ASA and 1 Nitro with effective results, however upon there arrival to hospital patient started back complaining of SOB and CP. Patient able to move over to bed without O2 and SpO2 drop to 83% placed on 2 L and increased to 93%.  CBG-125 20g IV to right hand/  +2 edema noted to bilateral lower legs.

## 2020-02-08 NOTE — ED Provider Notes (Signed)
[facility] Provider Note   CSN: [id number] Arrival date & time: 02/08/20  1510     History Chief Complaint  Patient presents with  . Shortness of Breath  . Chest Pain    [facility] is a 59 y.o. male.  The history is provided by the patient and medical records. No language interpreter was used.  Shortness of Breath Associated symptoms: chest pain   Chest Pain Associated symptoms: shortness of breath    [facility] is a 59 y.o. male who presents to the Emergency Department complaining of sob, chest pain.  He presents to the ED complaining of several days of sob and pleuritic chest pain. He has ESR D and dialyzes Tuesday and Friday. Over the last several days he is experienced significant worsening of his dyspnea on exertion. He also has orthopnea. Friday he developed chest discomfort that is constant and burning in nature. It is worse with deep breaths. His breathing feels better when the air conditioning is on and a fan is blowing in his face. No reports of fevers, vomiting. He does have lower extremity edema. He was only able to take three hours of his dialysis session yesterday due to cramping and headache. Symptoms are severe, constant, worsening.    Past Medical History:  Diagnosis Date  . Acute kidney injury superimposed on CKD ([place]) 05/24/2017  . Acute on chronic diastolic CHF (congestive heart failure) ([place]) 04/13/2018  . AKI (acute kidney injury) ([place]) 04/10/2015  . Arthritis   . Asthma   . Asthma, chronic, unspecified asthma severity, with acute exacerbation 10/23/2017  . Atypical chest pain 02/16/2018  . CAP (community acquired pneumonia) 10/23/2017  . Cataract    right - removed by surgery  . Cerebral infarction due to thrombosis of cerebral artery ([place])   . Cerebrovascular accident (CVA) ([place])   . Chronic kidney disease   . CKD (chronic kidney disease), stage V ([place]) 05/24/2017  . Cough    chronic cough  . Dyspnea     "all the time" per patient at PAT 12/30/19  RA sats 87%, no oxygen use  . ESRD (end stage renal disease) on dialysis ([place]) 02/16/2018   Dialysis  T Thus Sat  - [facility]   . Essential hypertension 04/09/2015  . GERD (gastroesophageal reflux disease) 06/04/2019  . GI bleeding 05/24/2017  . History of completed stroke 2018  . Hyperlipidemia   . Hypertension   . Hypertensive urgency 05/24/2017  . Kidney failure   . Oxygen deficiency 12/30/2019   O2 sats on RA 87% at PAT appt   . Respiratory failure, acute ([place]) 10/23/2017  . Restless leg syndrome   . Rib fractures 06/2019   Right  . Stroke ([place]) 2018  . Symptomatic anemia 05/24/2017  . Thoracic ascending aortic aneurysm ([place])    4.5 cm 06/04/19 CT    Patient Active Problem List   Diagnosis Date Noted  . Acute pulmonary edema ([place]) 01/23/2020  . Cervical disc herniation 01/04/2020  . Tachycardia 01/04/2020  . SOB (shortness of breath)   . Rib fractures 06/05/2019  . Fall at home, initial encounter 06/04/2019  . Right rib fracture 06/04/2019  . Lung contusion 06/04/2019  . Acute respiratory failure with hypoxia ([place]) 06/04/2019  . Anemia in ESRD (end-stage renal disease) ([place]) 06/04/2019  . GERD (gastroesophageal reflux disease) 06/04/2019  . Chronic diastolic (congestive) heart failure ([place]) 06/04/2019  . Fall 06/04/2019  . Thoracic ascending aortic aneurysm ([place]) 06/04/2019  .  Acute exacerbation of congestive heart failure ([place]) 05/16/2019  . Chest pain 04/13/2018  . Acute on chronic diastolic CHF (congestive heart failure) ([place]) 04/13/2018  . Chest pressure   . Atypical chest pain 02/16/2018  . ESRD on dialysis ([place]) 02/16/2018  . Volume overload 10/23/2017  . Cough 08/23/2017  . Constipation   . Macrocytic anemia 05/24/2017  . Hypertensive urgency 05/24/2017  . History of completed stroke   . Essential hypertension 04/09/2015    Past Surgical History:  Procedure Laterality Date  . ANTERIOR CERVICAL  DECOMP/DISCECTOMY FUSION N/A 01/04/2020   Procedure: ANTERIOR CERVICAL DECOMPRESSION/DISCECTOMY FUSION CERVICAL FIVE THROUGH SEVEN;  Surgeon: Melina Schools, MD;  Location: [place];  Service: Orthopedics;  Laterality: N/A;  3 hrs  . AV FISTULA PLACEMENT Left 05/28/2017   Procedure: LEFT ARM ARTERIOVENOUS (AV) FISTULA CREATION;  Surgeon: Conrad Delavan, MD;  Location: [place];  Service: Vascular;  Laterality: Left;  . EYE SURGERY Right 06/02/2019   Cataract removed  . FRACTURE SURGERY    . IR FLUORO GUIDE CV LINE RIGHT  05/25/2017  . IR US GUIDE VASC ACCESS RIGHT  05/25/2017  . REVISON OF ARTERIOVENOUS FISTULA Left 07/18/2019   Procedure: REVISION PLICATION OF RADIOCEPHALIC ARTERIOVENOUS FISTULA LEFT ARM;  Surgeon: Angelia Mould, MD;  Location: [facility];  Service: Vascular;  Laterality: Left;  . RINOPLASTY         Family History  Problem Relation Age of Onset  . Cancer Mother     Social History   Tobacco Use  . Smoking status: Never Smoker  . Smokeless tobacco: Never Used  Substance Use Topics  . Alcohol use: Not Currently    Alcohol/week: 0.0 standard drinks    Comment: has a drink once in a while- 12/30/19  . Drug use: No    Home Medications Prior to Admission medications   Medication Sig Start Date End Date Taking? Authorizing Provider  amLODipine (NORVASC) 10 MG tablet Take 1 tablet (10 mg total) by mouth daily. 05/18/19   Martyn Malay, MD  atorvastatin (LIPITOR) 20 MG tablet Take 1 tablet (20 mg total) by mouth daily. Patient not taking: Reported on 01/23/2020 06/10/19 12/30/19  Tobb, Godfrey Pick, DO  carvedilol (COREG) 12.5 MG tablet Take 1 tablet (12.5 mg total) by mouth 2 (two) times daily with a meal. 05/18/19   Martyn Malay, MD  hydrALAZINE (APRESOLINE) 100 MG tablet Take 1 tablet (100 mg total) by mouth every 8 (eight) hours. Patient taking differently: Take 100 mg by mouth 2 (two) times daily.  05/18/19   Martyn Malay, MD  isosorbide mononitrate (IMDUR) 30 MG 24 hr  tablet Take 1 tablet (30 mg total) by mouth daily. 07/15/19 12/30/19  Tobb, Kardie, DO  metoprolol tartrate (LOPRESSOR) 100 MG tablet Take 100 mg by mouth 2 (two) times daily.     [provider]  ondansetron (ZOFRAN) 4 MG tablet Take 1 tablet (4 mg total) by mouth every 8 (eight) hours as needed for nausea or vomiting. Patient not taking: Reported on 01/23/2020 01/05/20   Melina Schools, MD  sevelamer (RENAGEL) 800 MG tablet Take 800 mg by mouth daily.  01/25/18   [provider]    Allergies    Patient has no known allergies.  Review of Systems   Review of Systems  Respiratory: Positive for shortness of breath.   Cardiovascular: Positive for chest pain.  All other systems reviewed and are negative.   Physical Exam Updated Vital Signs BP 97/72 (BP Location:  Right Arm)   Pulse 71   Temp 98.2 F (36.8 C) (Oral)   Resp (!) 21   Ht 5' 8"  (1.727 m)   Wt 63.5 kg   SpO2 93%   BMI 21.29 kg/m   Physical Exam Vitals and nursing note reviewed.  Constitutional:      Appearance: He is well-developed. He is ill-appearing.  HENT:     Head: Normocephalic and atraumatic.  Cardiovascular:     Rate and Rhythm: Normal rate and regular rhythm.     Heart sounds: Murmur present.  Pulmonary:     Comments: Decreased air movement in bilateral bases with fine crackles in bilateral bases.  Increased work of breathing Abdominal:     Palpations: Abdomen is soft.     Tenderness: There is no abdominal tenderness. There is no guarding or rebound.  Musculoskeletal:        General: No tenderness.     Comments: Fistula in the left upper extremity. Non pitting edema to bilateral lower extremity  Skin:    General: Skin is warm and dry.  Neurological:     Mental Status: He is alert and oriented to person, place, and time.     Comments: Generalized weakness  Psychiatric:        Behavior: Behavior normal.     ED Results / Procedures / Treatments   Labs (all labs ordered are listed,  but only abnormal results are displayed) Labs Reviewed - No data to display  EKG None  Radiology No results found.  Procedures Procedures (including critical care time) CRITICAL CARE Performed by: Quintella Reichert   Total critical care time: 35 minutes  Critical care time was exclusive of separately billable procedures and treating other patients.  Critical care was necessary to treat or prevent imminent or life-threatening deterioration.  Critical care was time spent personally by me on the following activities: development of treatment plan with patient and/or surrogate as well as nursing, discussions with consultants, evaluation of patient's response to treatment, examination of patient, obtaining history from patient or surrogate, ordering and performing treatments and interventions, ordering and review of laboratory studies, ordering and review of radiographic studies, pulse oximetry and re-evaluation of patient's condition.  Medications Ordered in ED Medications - No data to display  ED Course  I have reviewed the triage vital signs and the nursing notes.  Pertinent labs & imaging results that were available during my care of the patient were reviewed by me and considered in my medical decision making (see chart for details).    MDM Rules/Calculators/A&P                     Oatient with ESR D on hemodialysis here for evaluation of increased shortness of breath, pleuritic chest pain. He is ill appearing on evaluation. EKG without acute ischemic changes. CTA was obtained given his history of aortic aneurysm. CTA without evidence of PE, dissection. He does have evidence of volume overload and increased oxygen requirement. Discussed with Dr. Posey Pronto, with nephrology plan to admit for dialysis tonight. Medicine consulted for admission for hypoxic respiratory failure. Patient is noted to be hypertensive in the department. He did not take his home medications today and these were  administered in the ED.  Final Clinical Impression(s) / ED Diagnoses Final diagnoses:  Acute respiratory failure with hypoxia (HCC)  ESRD (end stage renal disease) on dialysis [facility])  Pleuritic chest pain    Rx / DC Orders ED Discharge Orders  None       Quintella Reichert, MD 02/09/20 [phone]

## 2020-02-08 NOTE — H&P (Signed)
History and Physical    Thomas Robinson DPO:[id number] DOB: [birth year] DOA: 02/08/2020  PCP: Charlott Rakes, MD  Patient coming from: Home  I have personally briefly reviewed patient's old medical records in [place]  Chief Complaint: SOB, CP  HPI: Thomas Robinson is a 59 y.o. male with medical history significant of ESRD, HTN.  Pt is prescribed dialysis TTS, usually attends 2 out of the 3 prescribed days a week.  Of this only had 3h 45 min treatment when he last went yesterday.  Today started developing increasing work of breathing prompting him to present to the emergency room.  Of note, he was recently in the hospital between 5/24 and 5/25 for a similar reason and left AGAINST MEDICAL ADVICE.  He also admits to not taking any of his home BP meds today.  No fevers, chills, cough.   ED Course: BP running 431-540 systolic.  CTA chest is neg for dissection, has borderline TAAA, recd follow up in 1 year.  And of course CTA chest shows what is suspected to be pulm edema.  Trop 73 and 76.     Review of Systems: As per HPI, otherwise all review of systems negative.  Past Medical History:  Diagnosis Date  . Acute kidney injury superimposed on CKD ([place]) 05/24/2017  . Acute on chronic diastolic CHF (congestive heart failure) ([place]) 04/13/2018  . AKI (acute kidney injury) ([place]) 04/10/2015  . Arthritis   . Asthma   . Asthma, chronic, unspecified asthma severity, with acute exacerbation 10/23/2017  . Atypical chest pain 02/16/2018  . CAP (community acquired pneumonia) 10/23/2017  . Cataract    right - removed by surgery  . Cerebral infarction due to thrombosis of cerebral artery ([place])   . Cerebrovascular accident (CVA) ([place])   . Chronic kidney disease   . CKD (chronic kidney disease), stage V ([place]) 05/24/2017  . Cough    chronic cough  . Dyspnea    "all the time" per patient at PAT 12/30/19  RA sats 87%, no oxygen use  . ESRD (end stage renal disease) on dialysis ([place])  02/16/2018   Dialysis  T Thus Sat  - [facility]   . Essential hypertension 04/09/2015  . GERD (gastroesophageal reflux disease) 06/04/2019  . GI bleeding 05/24/2017  . History of completed stroke 2018  . Hyperlipidemia   . Hypertension   . Hypertensive urgency 05/24/2017  . Kidney failure   . Oxygen deficiency 12/30/2019   O2 sats on RA 87% at PAT appt   . Respiratory failure, acute ([place]) 10/23/2017  . Restless leg syndrome   . Rib fractures 06/2019   Right  . Stroke ([place]) 2018  . Symptomatic anemia 05/24/2017  . Thoracic ascending aortic aneurysm (HCC)    4.5 cm 06/04/19 CT    Past Surgical History:  Procedure Laterality Date  . ANTERIOR CERVICAL DECOMP/DISCECTOMY FUSION N/A 01/04/2020   Procedure: ANTERIOR CERVICAL DECOMPRESSION/DISCECTOMY FUSION CERVICAL FIVE THROUGH SEVEN;  Surgeon: Melina Schools, MD;  Location: [facility];  Service: Orthopedics;  Laterality: N/A;  3 hrs  . AV FISTULA PLACEMENT Left 05/28/2017   Procedure: LEFT ARM ARTERIOVENOUS (AV) FISTULA CREATION;  Surgeon: Conrad [place], MD;  Location: [facility];  Service: Vascular;  Laterality: Left;  . EYE SURGERY Right 06/02/2019   Cataract removed  . FRACTURE SURGERY    . IR FLUORO GUIDE CV LINE RIGHT  05/25/2017  . IR US GUIDE VASC ACCESS RIGHT  05/25/2017  . REVISON OF ARTERIOVENOUS FISTULA  Left 07/18/2019   Procedure: REVISION PLICATION OF RADIOCEPHALIC ARTERIOVENOUS FISTULA LEFT ARM;  Surgeon: Angelia Mould, MD;  Location: [facility];  Service: Vascular;  Laterality: Left;  . RINOPLASTY       reports that he has never smoked. He has never used smokeless tobacco. He reports previous alcohol use. He reports that he does not use drugs.  No Known Allergies  Family History  Problem Relation Age of Onset  . Cancer Mother      Prior to Admission medications   Medication Sig Start Date End Date Taking? Authorizing Provider  cinacalcet (SENSIPAR) 60 MG tablet Take 60 mg by mouth daily. 11/11/18  Yes [provider]  gabapentin (NEURONTIN) 300 MG capsule Take 600 mg by mouth at bedtime.   Yes [provider]  hydrALAZINE (APRESOLINE) 100 MG tablet Take 1 tablet (100 mg total) by mouth every 8 (eight) hours. Patient taking differently: Take 100 mg by mouth 3 (three) times daily.  05/18/19  Yes Martyn Malay, MD  metoprolol tartrate (LOPRESSOR) 100 MG tablet Take 100 mg by mouth 2 (two) times daily.    Yes [provider]  multivitamin (RENA-VIT) TABS tablet Take 1 tablet by mouth daily. 01/12/18  Yes [provider]  sevelamer carbonate (RENVELA) 800 MG tablet Take 800 mg by mouth daily. 11/07/19  Yes [provider]  amLODipine (NORVASC) 10 MG tablet Take 1 tablet (10 mg total) by mouth daily. Patient not taking: Reported on 02/08/2020 05/18/19   Martyn Malay, MD  atorvastatin (LIPITOR) 20 MG tablet Take 1 tablet (20 mg total) by mouth daily. Patient not taking: Reported on 01/23/2020 06/10/19 12/30/19  Tobb, Godfrey Pick, DO  carvedilol (COREG) 12.5 MG tablet Take 1 tablet (12.5 mg total) by mouth 2 (two) times daily with a meal. Patient not taking: Reported on 02/08/2020 05/18/19   Martyn Malay, MD  isosorbide mononitrate (IMDUR) 30 MG 24 hr tablet Take 1 tablet (30 mg total) by mouth daily. 07/15/19 12/30/19  Tobb, Kardie, DO  ondansetron (ZOFRAN) 4 MG tablet Take 1 tablet (4 mg total) by mouth every 8 (eight) hours as needed for nausea or vomiting. Patient not taking: Reported on 01/23/2020 01/05/20   Melina Schools, MD    Physical Exam: Vitals:   02/08/20 1933 02/08/20 1945 02/08/20 2000 02/08/20 2015  BP: (!) 199/88 (!) 214/90 (!) 197/86 (!) 186/81  Pulse: 73 69 72 70  Resp: 16 (!) 23 (!) 24 18  Temp: 97.8 F (36.6 C)     TempSrc: Oral     SpO2: 93% 94% 95% 94%  Weight:      Height:        Constitutional: NAD, calm, comfortable Eyes: PERRL, lids and conjunctivae normal ENMT: Mucous membranes are moist. Posterior pharynx clear of any exudate or  lesions.Normal dentition.  Neck: normal, supple, no masses, no thyromegaly Respiratory: Rales B. Cardiovascular: Regular rate and rhythm, no murmurs / rubs / gallops. Edema 1+ on R, trace on L. 2+ pedal pulses. No carotid bruits.  Abdomen: no tenderness, no masses palpated. No hepatosplenomegaly. Bowel sounds positive.  Musculoskeletal: no clubbing / cyanosis. No joint deformity upper and lower extremities. Good ROM, no contractures. Normal muscle tone.  Skin: no rashes, lesions, ulcers. No induration Neurologic: CN 2-12 grossly intact. Sensation intact, DTR normal. Strength 5/5 in all 4.  Psychiatric: Normal judgment and insight. Alert and oriented x 3. Normal mood.    Labs on Admission: I have personally reviewed following labs and  imaging studies  CBC: Recent Labs  Lab 02/08/20 1644  WBC 6.0  NEUTROABS 5.1  HGB 8.3*  HCT 25.8*  MCV 104.5*  PLT 659*   Basic Metabolic Panel: Recent Labs  Lab 02/08/20 1644  NA 139  K 4.8  CL 98  CO2 24  GLUCOSE 99  BUN 62*  CREATININE 8.82*  CALCIUM 9.5   GFR: Estimated Creatinine Clearance: 8 mL/min (A) (by C-G formula based on SCr of 8.82 mg/dL (H)). Liver Function Tests: Recent Labs  Lab 02/08/20 1644  AST 18  ALT 11  ALKPHOS 75  BILITOT 1.1  PROT 7.0  ALBUMIN 3.7   No results for input(s): LIPASE, AMYLASE in the last 168 hours. No results for input(s): AMMONIA in the last 168 hours. Coagulation Profile: No results for input(s): INR, PROTIME in the last 168 hours. Cardiac Enzymes: No results for input(s): CKTOTAL, CKMB, CKMBINDEX, TROPONINI in the last 168 hours. BNP (last 3 results) No results for input(s): PROBNP in the last 8760 hours. HbA1C: No results for input(s): HGBA1C in the last 72 hours. CBG: No results for input(s): GLUCAP in the last 168 hours. Lipid Profile: No results for input(s): CHOL, HDL, LDLCALC, TRIG, CHOLHDL, LDLDIRECT in the last 72 hours. Thyroid Function Tests: No results for input(s): TSH,  T4TOTAL, FREET4, T3FREE, THYROIDAB in the last 72 hours. Anemia Panel: No results for input(s): VITAMINB12, FOLATE, FERRITIN, TIBC, IRON, RETICCTPCT in the last 72 hours. Urine analysis:    Component Value Date/Time   COLORURINE YELLOW 05/24/2017 1729   APPEARANCEUR HAZY (A) 05/24/2017 1729   LABSPEC 1.011 05/24/2017 1729   PHURINE 5.0 05/24/2017 1729   GLUCOSEU 50 (A) 05/24/2017 1729   HGBUR SMALL (A) 05/24/2017 1729   BILIRUBINUR NEGATIVE 05/24/2017 1729   KETONESUR NEGATIVE 05/24/2017 1729   PROTEINUR 100 (A) 05/24/2017 1729   UROBILINOGEN 0.2 04/09/2015 1527   NITRITE NEGATIVE 05/24/2017 1729   LEUKOCYTESUR TRACE (A) 05/24/2017 1729    Radiological Exams on Admission: DG Chest Port 1 View  Result Date: 02/08/2020 CLINICAL DATA:  Shortness of breath. EXAM: PORTABLE CHEST 1 VIEW COMPARISON:  Jan 23, 2020 FINDINGS: Mild, diffuse chronic appearing increased lung markings are seen. Mild atelectasis is seen within the bilateral lung bases. There is no evidence of a pleural effusion or pneumothorax. The cardiac silhouette is mildly enlarged and unchanged in size. A radiopaque fusion plate and screws are seen overlying the lower cervical spine. The visualized skeletal structures are unremarkable. IMPRESSION: Stable cardiomegaly with chronic appearing increased lung markings. A mild superimposed component of interstitial edema cannot be excluded. Electronically Signed   By: Virgina Norfolk M.D.   On: 02/08/2020 17:23   CT Angio Chest/Abd/Pel for Dissection W and/or W/WO  Result Date: 02/08/2020 CLINICAL DATA:  No aortic aneurysm. End-stage renal disease on hemodialysis for 3 years. Chest pain and back pain. Concern for acute aortic dissection. EXAM: CT ANGIOGRAPHY CHEST, ABDOMEN AND PELVIS TECHNIQUE: Non-contrast CT of the chest was initially obtained. Multidetector CT imaging through the chest, abdomen and pelvis was performed using the standard protocol during bolus administration of  intravenous contrast. Multiplanar reconstructed images and MIPs were obtained and reviewed to evaluate the vascular anatomy. CONTRAST:  191mL OMNIPAQUE IOHEXOL 350 MG/ML SOLN COMPARISON:  Chest CT 01/23/2020 FINDINGS: CTA CHEST FINDINGS Cardiovascular: Noncontrast imaging of the thorax demonstrates no intramural hematoma within the thoracic aorta. Contrast enhanced imaging demonstrates no dissection of the thoracic aorta. The ascending thoracic aorta is upper limits of normal at 40 mm. [employer]  vessels are normal. Descending thoracic aorta is normal at 21 mm Mediastinum/Nodes: No axillary supraclavicular adenopathy. No new mediastinal hilar adenopathy no pericardial effusion. Esophagus normal. Lungs/Pleura: There is ground-glass opacities in the lower lobes as well as upper lobes. There is greater density in the posterior aspect of the RIGHT upper lobe (image 61/7). There is a moderate RIGHT effusion. Peribronchial thickening in the lower lobes. The ground-glass opacities which are diffuse are worsened from exam 01/23/2020. RIGHT pleural effusion is also worse. Musculoskeletal: No aggressive osseous lesion healed posterior rib fractures. Review of the MIP images confirms the above findings. CTA ABDOMEN AND PELVIS FINDINGS VASCULAR Aorta: No dissection in the abdominal aorta. Celiac: Widely patent SMA: Widely patent Renals: Single renal artery on the LEFT. Two renal arteries on the RIGHT. IMA: Widely patent Inflow: Normal Veins: Normal Review of the MIP images confirms the above findings. NON-VASCULAR Hepatobiliary: No focal hepatic lesion. Pancreas: Normal pancreas Spleen: Normal spleen Adrenals/Urinary Tract: Adrenal glands normal. Kidneys are atrophic. No significant renal enhancement on arterial phase. Stomach/Bowel: Stomach, small bowel, appendix, and cecum are normal. The colon and rectosigmoid colon are normal. Lymphatic: No lymphadenopathy Reproductive: Prostate unremarkable Other: No abdominal hernia  Musculoskeletal: No aggressive osseous lesion Review of the MIP images confirms the above findings. IMPRESSION: Chest Impression: 1. No aortic dissection. 2. Ascending thoracic aorta borderline aneurysmal. Recommend annual imaging followup by CTA or MRA. This recommendation follows 2010 ACCF/AHA/AATS/ACR/ASA/SCA/SCAI/SIR/STS/SVM Guidelines for the Diagnosis and Management of Patients with Thoracic Aortic Disease. Circulation. 2010; 121: D924-Q683. Aortic aneurysm NOS (ICD10-I71.9) 3. Increase in diffuse ground-glass opacities suggest pulmonary edema pattern. Increased RIGHT pleural effusion. Some increased ground-glass focality in the RIGHT lung is favored related to edema however cannot exclude superimposed pulmonary infection. Abdomen / Pelvis Impression: 1. No evidence of aortic dissection. 2. No acute findings in the abdomen pelvis. Electronically Signed   By: Suzy Bouchard M.D.   On: 02/08/2020 18:54    EKG: Independently reviewed.  Assessment/Plan Principal Problem:   Hypertensive urgency Active Problems:   Essential hypertension   Volume overload   ESRD on dialysis [facility])   Thoracic ascending aortic aneurysm (HCC)   Non-compliance with renal dialysis ([place])    1. HTN urgency / fluid overload - 1. Resume home BP meds that he missed today 2. Going to dialysis tonight 3. Monitor BP with dialysis, add additional BP meds if needed after dialysis 2. ESRD - 1. As above 2. Resume phos binders 3. Anemia of CKD - 1. Spotty adherence with dialysis likely leading to staggered ESA dosing.  Denies overt loss. 4. TAAA - 1. Borderline TAAA on CT scan 2. Needs f/u in 1 year.  DVT prophylaxis: heparin [place] Code Status: Full Family Communication: No family in room Disposition Plan: Home after volume overload and HTN urgency resolved Consults called: Dr. Posey Pronto Admission status: Place in obs    Marquin Patino, [facility]  How to contact the [facility] Attending or Consulting provider  [place] or covering provider during after hours [place], for this patient?  1. Check the care team in [facility] and look for a) attending/consulting TRH provider listed and b) the [facility] team listed 2. Log into [email or web address]  Amion Physician Scheduling and messaging for groups and whole hospitals  On call and physician scheduling software for group practices, residents, hospitalists and other medical providers for call, clinic, rotation and shift schedules. OnCall [employer] is a hospital-wide system for scheduling doctors and paging doctors on call. EasyPlot is for scientific  plotting and data analysis.  [email or web address]  and use [name]'s universal password to access. If you do not have the password, please contact the hospital operator.  3. Locate the [facility] provider you are looking for under [facility] and page to a number that you can be directly reached. 4. If you still have difficulty reaching the provider, please page the [facility] (Director on Call) for the Hospitalists listed on amion for assistance.  02/08/2020, 8:52 PM

## 2020-02-09 DIAGNOSIS — I160 Hypertensive urgency: Secondary | ICD-10-CM | POA: Diagnosis not present

## 2020-02-09 LAB — CBC
HCT: 25.7 % — ABNORMAL LOW (ref 39.0–52.0)
Hemoglobin: 8.4 g/dL — ABNORMAL LOW (ref 13.0–17.0)
MCH: 33.7 pg (ref 26.0–34.0)
MCHC: 32.7 g/dL (ref 30.0–36.0)
MCV: 103.2 fL — ABNORMAL HIGH (ref 80.0–100.0)
Platelets: 141 10*3/uL — ABNORMAL LOW (ref 150–400)
RBC: 2.49 MIL/uL — ABNORMAL LOW (ref 4.22–5.81)
RDW: 13.8 % (ref 11.5–15.5)
WBC: 5 10*3/uL (ref 4.0–10.5)
nRBC: 0 % (ref 0.0–0.2)

## 2020-02-09 LAB — MRSA PCR SCREENING: MRSA by PCR: NEGATIVE

## 2020-02-09 LAB — BASIC METABOLIC PANEL
Anion gap: 12 (ref 5–15)
BUN: 26 mg/dL — ABNORMAL HIGH (ref 6–20)
CO2: 28 mmol/L (ref 22–32)
Calcium: 9.4 mg/dL (ref 8.9–10.3)
Chloride: 100 mmol/L (ref 98–111)
Creatinine, Ser: 4.82 mg/dL — ABNORMAL HIGH (ref 0.61–1.24)
GFR calc Af Amer: 14 mL/min — ABNORMAL LOW (ref >60)
GFR calc non Af Amer: 12 mL/min — ABNORMAL LOW (ref >60)
Glucose, Bld: 92 mg/dL (ref 70–99)
Potassium: 3.9 mmol/L (ref 3.5–5.1)
Sodium: 140 mmol/L (ref 135–145)

## 2020-02-09 MED ORDER — HYDRALAZINE HCL 50 MG PO TABS: 100.0000 mg | ORAL_TABLET | Freq: Three times a day (TID) | ORAL | Status: DC

## 2020-02-09 MED ORDER — HYDRALAZINE HCL 20 MG/ML IJ SOLN: 10.0000 mg | Freq: Four times a day (QID) | INTRAMUSCULAR | Status: DC | PRN

## 2020-02-09 MED ORDER — METOPROLOL TARTRATE 100 MG PO TABS: 100.0000 mg | ORAL_TABLET | Freq: Two times a day (BID) | ORAL | Status: DC

## 2020-02-09 MED ORDER — AMLODIPINE BESYLATE 10 MG PO TABS: 10.0000 mg | ORAL_TABLET | Freq: Every day | ORAL | Status: DC

## 2020-02-09 NOTE — Discharge Summary (Signed)
                                                         AMA  Patient at this time expresses desire to leave the Hospital immidiately, patient has been warned that this is not Medically advisable at this time, and can result in Medical complications like Death and Disability, patient understands and accepts the risks involved and assumes full responsibilty of this decision.   He did qualify for home oxygen and it was ordered however he did not wait to get home oxygen and told the nurse he does not want to wait any longer wants to leave right away as he is a busy man.   Lala Lund M.D on 02/09/2020 at 11:08 AM  [facility]  Time < 30 minutes  Last Note Below

## 2020-02-09 NOTE — Progress Notes (Signed)
Attempted to get report from ED. EDRN to call back

## 2020-02-09 NOTE — Progress Notes (Signed)
Patient signed AMA form and was transported via wheelchair out to lobby and waited for his private transportation.

## 2020-02-09 NOTE — Progress Notes (Addendum)
Seen in room this am.  Sats in the 80s ambulating.  Some unsteadiness while standing and talking to me.  Lungs. Clear no LE edema - net UF 3 L- pre HD wt was 63.5. which would make post wt 60.5 - still above prior edw 59 .  hgb 8.4 - appears to be missing ESA due to missed dialysis. AVF patent  Patient has since left AMA - will notify home HD unit of need to redose ESA at next visit.  Amalia Hailey, PA-C [facility].  I stopped by to evaluate patient as well.  Impatient to leave, says going to leave AMA.  Has a few basilar rales.  I strongly encouraged him to adhere to na/fluid restriction and attend full dialysis sessions.   Jannifer Hick MD [facility] Pager [phone]

## 2020-02-09 NOTE — Progress Notes (Addendum)
Patient informed RN that he does not want to have his dialysis and any other test and wants to be discharged. Explained to patient about benefits and consequencies of not following prescribed medical regimen and patient aware and insist of going home today. Md notified and made aware of patient request. Patient insist on going home ASAP.  Patient was 84% O2 Saturation on resting  And 79 % on ambulation without O2 .

## 2020-02-09 NOTE — Progress Notes (Signed)
New Admission Note:   Arrival Method: from HD via stretcher Mental Orientation: Alert & Oriented x4 Telemetry: [phone], CCMD notified Assessment: Completed Skin: Intact, dry and warm IV: RUA & RH, NSL Pain: 0/10 Tubes: None Safety Measures: Safety Fall Prevention Plan has been discussed  Admission: to be completed 5 [place] Orientation: Patient has been orientated to the room, unit and staff.   Family: none at bedside  Orders to be reviewed and implemented. Will continue to monitor the patient. Call light has been placed within reach and bed alarm has been activated.

## 2020-02-10 ENCOUNTER — Telehealth: Payer: Self-pay | Admitting: Nephrology

## 2020-02-10 ENCOUNTER — Telehealth: Payer: Self-pay

## 2020-02-10 NOTE — Telephone Encounter (Signed)
Transition Care Management Follow-up Telephone Call Date of discharge and from where: 02/09/2020 Thomas Robinson -Left AMA How have you been since you were released from the hospital? Feeling Great Offered to have a f /u appt. Stated he will be seeing a new provider and establish care at [facility] in [place] .  Made Carlis Abbott, RN CM aware that pt will be followed up by [place].

## 2020-02-10 NOTE — Telephone Encounter (Signed)
Transition of care contact from inpatient facility  Date of Discharge: 02/09/20 - left AMA Date of Contact: 02/10/20 Method of contact: Phone  Attempted to contact patient to discuss transition of care from inpatient admission. Patient did not answer the phone. Message was left on the patient's voicemail with call back number [phone]. Encouraged to attend dialysis 02/11/20.  Alric Seton, [facility].

## 2020-02-29 ENCOUNTER — Telehealth: Payer: Self-pay | Admitting: *Deleted

## 2020-02-29 NOTE — Telephone Encounter (Signed)
   [facility] Pre-operative Risk Assessment    HEARTCARE STAFF: - Please ensure there is not already an duplicate clearance open for this procedure. - Under Visit Info/Reason for Call, type in Other and utilize the format Clearance MM/DD/YY or Clearance TBD. Do not use dashes or single digits. - If request is for dental extraction, please clarify the # of teeth to be extracted.  Request for surgical clearance:  1. What type of surgery is being performed? Lumbar Decompression  2. When is this surgery scheduled? TBD  3. What type of clearance is required (medical clearance vs. Pharmacy clearance to hold med vs. Both)? Medical   4. Are there any medications that need to be held prior to surgery and how long? None Listed  5. Practice name and name of physician performing surgery? [facility]   6. What is the office phone number? [phone]   7.   What is the office fax number? [phone]  8.   Anesthesia type (None, local, MAC, general) ? General   Ronaldo Miyamoto 02/29/2020, 4:53 PM  _________________________________________________________________   (provider comments below)

## 2020-03-01 NOTE — Telephone Encounter (Signed)
° °  Primary Cardiologist: [name], DO  Chart reviewed as part of pre-operative protocol coverage. Given past medical history and time since last visit, based on ACC/AHA guidelines, Thomas Robinson would be at acceptable risk for the planned procedure without further cardiovascular testing.   I will route this recommendation to the requesting party via Epic fax function and remove from pre-op pool.  Please call with questions.  Jossie Ng. Armine Rizzolo NP-C    03/01/2020, 9:43 AM [place] [address] Office [phone] Fax [phone]

## 2020-03-01 NOTE — Telephone Encounter (Signed)
Follow Up  Patient returning preop call. Please give patient a call back at [phone].

## 2020-03-21 ENCOUNTER — Encounter: Payer: Self-pay | Admitting: Cardiology

## 2020-03-21 ENCOUNTER — Other Ambulatory Visit: Payer: Self-pay

## 2020-03-21 ENCOUNTER — Ambulatory Visit (INDEPENDENT_AMBULATORY_CARE_PROVIDER_SITE_OTHER): Payer: Medicare Other | Admitting: Cardiology

## 2020-03-21 VITALS — BP 160/64 | HR 80 | Ht 68.0 in | Wt 136.0 lb

## 2020-03-21 DIAGNOSIS — Z8673 Personal history of transient ischemic attack (TIA), and cerebral infarction without residual deficits: Secondary | ICD-10-CM

## 2020-03-21 DIAGNOSIS — Z9115 Patient's noncompliance with renal dialysis: Secondary | ICD-10-CM | POA: Diagnosis not present

## 2020-03-21 DIAGNOSIS — I712 Thoracic aortic aneurysm, without rupture, unspecified: Secondary | ICD-10-CM

## 2020-03-21 DIAGNOSIS — I10 Essential (primary) hypertension: Secondary | ICD-10-CM

## 2020-03-21 DIAGNOSIS — I5032 Chronic diastolic (congestive) heart failure: Secondary | ICD-10-CM

## 2020-03-21 DIAGNOSIS — Z992 Dependence on renal dialysis: Secondary | ICD-10-CM

## 2020-03-21 DIAGNOSIS — N186 End stage renal disease: Secondary | ICD-10-CM

## 2020-03-21 DIAGNOSIS — I7121 Aneurysm of the ascending aorta, without rupture: Secondary | ICD-10-CM

## 2020-03-21 MED ORDER — FUROSEMIDE 40 MG PO TABS
ORAL_TABLET | ORAL | 3 refills | Status: DC
Start: 2020-03-21 — End: 2020-08-14

## 2020-03-21 NOTE — Addendum Note (Signed)
Addended by: Truddie Hidden on: 03/21/2020 03:34 PM   Modules accepted: Orders

## 2020-03-21 NOTE — Progress Notes (Signed)
Cardiology Office Note:    Date:  03/21/2020   ID:  Thomas Robinson, DOB July 10, 1961, MRN [id number]  PCP:  Aretta Nip, MD  Cardiologist:  [name], DO  Electrophysiologist:  None   Referring MD: Charlott Rakes, MD    History of Present Illness:    Thomas Robinson a 59 y.o.malewith a hx of ESRD on hemodialysis(Tuesdays, Thursday, Saturday)hypertension, asthma, CVA,coronary artery disease,diastolic heart failure with recent ejection fraction reported as greater than 65%, anemia chronic disease, proximal ascending aorta dilatation measuring 4.5 cm in October 2020.  I last saw the patient on October 31, 2019 at that time he was stable from a cardiovascular standpoint.  We continued his medication for CAD: Aspirin, atorvastatin 20 mg daily and Imdur 20 mg daily.  We also discussed his proximal ascending aorta and plan for repeat CT.  In the interim I did receive a phone call from the patient PCP requested that the patient does need oxygen.  During our discussion I did defer to her for the start of oxygen giving the diagnosis for acute respiratory failure or have the patient see pulmonary.  She was also concerned about the patient compliance with his medical care.  Today he is here for follow-up visit. The patient is here today for follow up visit. He tells me he is planning a back surgery due to extreme back pain.  He also note that he has been working with his PCP who has asked him not to travel as he needed to go to [place] for some personal issues.  No other complaints at this time.  He denies any chest pain, but states that he is at his baseline shortness of breath.  Past Medical History:  Diagnosis Date  . Acute kidney injury superimposed on CKD ([place]) 05/24/2017  . Acute on chronic diastolic CHF (congestive heart failure) ([place]) 04/13/2018  . AKI (acute kidney injury) ([place]) 04/10/2015  . Arthritis   . Asthma   . Asthma, chronic, unspecified asthma severity, with acute  exacerbation 10/23/2017  . Atypical chest pain 02/16/2018  . CAP (community acquired pneumonia) 10/23/2017  . Cataract    right - removed by surgery  . Cerebral infarction due to thrombosis of cerebral artery ([place])   . Cerebrovascular accident (CVA) ([place])   . Chronic kidney disease   . CKD (chronic kidney disease), stage V ([place]) 05/24/2017  . Cough    chronic cough  . Dyspnea    "all the time" per patient at PAT 12/30/19  RA sats 87%, no oxygen use  . ESRD (end stage renal disease) on dialysis ([place]) 02/16/2018   Dialysis  T Thus Sat  - [facility]   . Essential hypertension 04/09/2015  . GERD (gastroesophageal reflux disease) 06/04/2019  . GI bleeding 05/24/2017  . History of completed stroke 2018  . Hyperlipidemia   . Hypertension   . Hypertensive urgency 05/24/2017  . Kidney failure   . Oxygen deficiency 12/30/2019   O2 sats on RA 87% at PAT appt   . Respiratory failure, acute ([place]) 10/23/2017  . Restless leg syndrome   . Rib fractures 06/2019   Right  . Stroke ([place]) 2018  . Symptomatic anemia 05/24/2017  . Thoracic ascending aortic aneurysm ([facility])    4.5 cm 06/04/19 CT    Past Surgical History:  Procedure Laterality Date  . ANTERIOR CERVICAL DECOMP/DISCECTOMY FUSION N/A 01/04/2020   Procedure: ANTERIOR CERVICAL DECOMPRESSION/DISCECTOMY FUSION CERVICAL FIVE THROUGH SEVEN;  Surgeon: Melina Schools, MD;  Location:  [facility];  Service: Orthopedics;  Laterality: N/A;  3 hrs  . AV FISTULA PLACEMENT Left 05/28/2017   Procedure: LEFT ARM ARTERIOVENOUS (AV) FISTULA CREATION;  Surgeon: Conrad New [place], MD;  Location: [facility];  Service: Vascular;  Laterality: Left;  . EYE SURGERY Right 06/02/2019   Cataract removed  . FRACTURE SURGERY    . IR FLUORO GUIDE CV LINE RIGHT  05/25/2017  . IR US GUIDE VASC ACCESS RIGHT  05/25/2017  . REVISON OF ARTERIOVENOUS FISTULA Left 07/18/2019   Procedure: REVISION PLICATION OF RADIOCEPHALIC ARTERIOVENOUS FISTULA LEFT ARM;  Surgeon: Angelia Mould, MD;   Location: [facility];  Service: Vascular;  Laterality: Left;  . RINOPLASTY      Current Medications: Current Meds  Medication Sig  . AURYXIA 1 GM 210 MG(Fe) tablet Take 420 mg by mouth 3 (three) times daily.  . carvedilol (COREG) 12.5 MG tablet Take 1 tablet (12.5 mg total) by mouth 2 (two) times daily with a meal.  . cinacalcet (SENSIPAR) 60 MG tablet Take 60 mg by mouth daily.  . hydrALAZINE (APRESOLINE) 100 MG tablet Take 1 tablet (100 mg total) by mouth every 8 (eight) hours.  . isosorbide mononitrate (IMDUR) 30 MG 24 hr tablet Take 1 tablet (30 mg total) by mouth daily.  . metoprolol tartrate (LOPRESSOR) 100 MG tablet Take 100 mg by mouth 2 (two) times daily.   . multivitamin (RENA-VIT) TABS tablet Take 1 tablet by mouth daily.  . sevelamer (RENAGEL) 800 MG tablet 800 mg.  . sevelamer carbonate (RENVELA) 800 MG tablet Take 800 mg by mouth daily.     Allergies:   Patient has no known allergies.   Social History   Socioeconomic History  . Marital status: Single    Spouse name: Not on file  . Number of children: Not on file  . Years of education: Not on file  . Highest education level: Not on file  Occupational History  . Not on file  Tobacco Use  . Smoking status: Never Smoker  . Smokeless tobacco: Never Used  Vaping Use  . Vaping Use: Never used  Substance and Sexual Activity  . Alcohol use: Not Currently    Alcohol/week: 0.0 standard drinks    Comment: has a drink once in a while- 12/30/19  . Drug use: No  . Sexual activity: Not Currently  Other Topics Concern  . Not on file  Social History Narrative   Lives with a roommate in a one story home.  Has 1 child.  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  Education: high school.   Social Determinants of Health   Financial Resource Strain:   . Difficulty of Paying Living Expenses:   Food Insecurity:   . Worried About Charity fundraiser in the Last Year:   . Arboriculturist in the Last Year:   Transportation Needs:   . Consulting civil engineer (Medical):   [name] Lack of Transportation (Non-Medical):   Physical Activity:   . Days of Exercise per Week:   . Minutes of Exercise per Session:   Stress:   . Feeling of Stress :   Social Connections:   . Frequency of Communication with Friends and Family:   . Frequency of Social Gatherings with Friends and Family:   . Attends Religious Services:   . Active Member of Clubs or Organizations:   . Attends Archivist Meetings:   [name] Marital Status:      Family History:  The patient's family history includes Cancer in his mother.  ROS:   Review of Systems  Constitution: Negative for decreased appetite, fever and weight gain.  HENT: Negative for congestion, ear discharge, hoarse voice and sore throat.   Eyes: Negative for discharge, redness, vision loss in right eye and visual halos.  Cardiovascular: Negative for chest pain, dyspnea on exertion, leg swelling, orthopnea and palpitations.  Respiratory: Negative for cough, hemoptysis, shortness of breath and snoring.   Endocrine: Negative for heat intolerance and polyphagia.  Hematologic/Lymphatic: Negative for bleeding problem. Does not bruise/bleed easily.  Skin: Negative for flushing, nail changes, rash and suspicious lesions.  Musculoskeletal: Negative for arthritis, joint pain, muscle cramps, myalgias, neck pain and stiffness.  Gastrointestinal: Negative for abdominal pain, bowel incontinence, diarrhea and excessive appetite.  Genitourinary: Negative for decreased libido, genital sores and incomplete emptying.  Neurological: Negative for brief paralysis, focal weakness, headaches and loss of balance.  Psychiatric/Behavioral: Negative for altered mental status, depression and suicidal ideas.  Allergic/Immunologic: Negative for HIV exposure and persistent infections.    EKGs/Labs/Other Studies Reviewed:    The following studies were reviewed today:   EKG: None today  CTA chest abdomen IMPRESSION: June 04, 2019 1. Acute posterior right eighth and ninth rib fractures. No pneumothorax. 2. Patchy consolidation in the peripheral right lower lobe, compatible with pulmonary contusion. 3. Mild cardiomegaly. Two-vessel coronary atherosclerosis. 4. Mild diffuse interlobular septal thickening, suggesting mild pulmonary edema. 5. Mosaic attenuation throughout both lungs, a nonspecific finding that can be due to air trapping from small airways disease or mosaic perfusion from pulmonary vascular disease. 6. Ascending thoracic aortic 4.5 cm aneurysm. Ascending thoracic aortic aneurysm. Recommend semi-annual imaging followup by CTA or MRA and referral to cardiothoracic surgery if not already obtained. This recommendation follows 2010 ACCF/AHA/AATS/ACR/ASA/SCA/SCAI/SIR/STS/SVM Guidelines for the Diagnosis and Management of Patients With Thoracic Aortic Disease. Circulation. 2010; 121: O160-V371. Aortic aneurysm NOS (ICD10-I71.9). 7. No evidence of acute traumatic injury in the abdomen or pelvis on this noncontrast scan.  May 17, 2019 IMPRESSIONS  1. The left ventricle has hyperdynamic systolic function, with an  ejection fraction of >65%. The cavity size was normal. There is severely  increased left ventricular wall thickness. Left ventricular diastolic  Doppler parameters are consistent with  impaired relaxation. Elevated left atrial and left ventricular  end-diastolic pressures The E/e' is >15. No evidence of left ventricular  regional wall motion abnormalities.  2. The right ventricle has normal systolic function. The cavity was  normal. There is no increase in right ventricular wall thickness.  3. Left atrial size was severely dilated.  4. The pericardial effusion is anterior to the right ventricle.  5. Trivial pericardial effusion is present.  6. The mitral valve is abnormal. Mild thickening of the mitral valve  leaflet.  7. The tricuspid valve is grossly normal.  8. The  aortic valve is tricuspid. Mild sclerosis of the aortic valve.  Aortic valve regurgitation is moderate by color flow Doppler.  9. The aorta is normal unless otherwise noted.  10. The inferior vena cava was dilated in size with >50% respiratory  variability.   Recent Labs: 02/08/2020: ALT 11; B Natriuretic Peptide 4,270.9 02/09/2020: BUN 26; Creatinine, Ser 4.82; Hemoglobin 8.4; Platelets 141; Potassium 3.9; Sodium 140  Recent Lipid Panel    Component Value Date/Time   CHOL 133 06/10/2019 0944   TRIG 59 06/10/2019 0944   HDL 46 06/10/2019 0944   CHOLHDL 2.9 06/10/2019 0944   CHOLHDL 2.8 02/17/2018 0345  VLDL 14 02/17/2018 0345   LDLCALC 74 06/10/2019 0944    Physical Exam:    VS:  Ht 5\' 8"  (1.727 m)   Wt 136 lb (61.7 kg)   BMI 20.68 kg/m     Wt Readings from Last 3 Encounters:  03/21/20 136 lb (61.7 kg)  02/09/20 128 lb 15.5 oz (58.5 kg)  01/24/20 133 lb 6.1 oz (60.5 kg)     GEN: Well nourished, well developed in no acute distress HEENT: Normal NECK: No JVD; No carotid bruits LYMPHATICS: No lymphadenopathy CARDIAC: S1S2 noted,RRR, 2/6 holosystolic murmurs, rubs, gallops RESPIRATORY:  Clear to auscultation without rales, wheezing or rhonchi  ABDOMEN: Soft, non-tender, non-distended, +bowel sounds, no guarding. EXTREMITIES: 3+ bilateral leg edema, No cyanosis, no clubbing MUSCULOSKELETAL:  No deformity  SKIN: Warm and dry NEUROLOGIC:  Alert and oriented x 3, non-focal PSYCHIATRIC:  Normal affect, good insight  ASSESSMENT:    1. Chronic diastolic (congestive) heart failure ([place])   2. Essential hypertension   3. Thoracic ascending aortic aneurysm ([place])   4. Non-compliance with renal dialysis ([place])   5. History of completed stroke   6. ESRD on dialysis [facility])    PLAN:     His clinical exam does suggest volume overload.  The patient adamantly tells me that he got his dialysis treatment yesterday and notes that he woke up with significant leg edema.  With this he  tells me that he also still makes urine therefore I am going to give the patient Lasix 40 mg on nondialysis days (Monday Wednesday and Fridays).  He still has not seen nephrology.  Of asked the patient if he really does need to follow-up with nephrology given his kidney disease.  I will order a CTA of the chest to reassess his ascending aorta aneurysm which was 4.5 cm in October 2020.  He is requesting a letter as he does not want to travel to [place] for some personal case.  He says his PCP does not want him traveling therefore I will defer to his PCP for writing this letter.  ESRD HD on Tuesday Thursdays and Saturdays.  Coronary artery disease-denies any anginal symptoms continue current medication regimen.  The patient is in agreement with the above plan. The patient left the office in stable condition.  The patient will follow up in 3 months or sooner if needed.  Medication Adjustments/Labs and Tests Ordered: Current medicines are reviewed at length with the patient today.  Concerns regarding medicines are outlined above.  No orders of the defined types were placed in this encounter.  No orders of the defined types were placed in this encounter.   There are no Patient Instructions on file for this visit.   Adopting a Healthy Lifestyle.  Know what a healthy weight is for you (roughly BMI <25) and aim to maintain this   Aim for 7+ servings of fruits and vegetables daily   65-80+ fluid ounces of water or unsweet tea for healthy kidneys   Limit to max 1 drink of alcohol per day; avoid smoking/tobacco   Limit animal fats in diet for cholesterol and heart health - choose grass fed whenever available   Avoid highly processed foods, and foods high in saturated/trans fats   Aim for low stress - take time to unwind and care for your mental health   Aim for 150 min of moderate intensity exercise weekly for heart health, and weights twice weekly for bone health   Aim for 7-9 hours of  sleep daily   When it comes to diets, agreement about the perfect plan isnt easy to find, even among the experts. Experts at the [employer] developed an idea known as the Healthy Eating Plate. Just imagine a plate divided into logical, healthy portions.   The emphasis is on diet quality:   Load up on vegetables and fruits - one-half of your plate: Aim for color and variety, and remember that potatoes dont count.   Go for whole grains - one-quarter of your plate: Whole wheat, barley, wheat berries, quinoa, oats, brown rice, and foods made with them. If you want pasta, go with whole wheat pasta.   Protein power - one-quarter of your plate: Fish, chicken, beans, and nuts are all healthy, versatile protein sources. Limit red meat.   The diet, however, does go beyond the plate, offering a few other suggestions.   Use healthy plant oils, such as olive, canola, soy, corn, sunflower and peanut. Check the labels, and avoid partially hydrogenated oil, which have unhealthy trans fats.   If youre thirsty, drink water. Coffee and tea are good in moderation, but skip sugary drinks and limit milk and dairy products to one or two daily servings.   The type of carbohydrate in the diet is more important than the amount. Some sources of carbohydrates, such as vegetables, fruits, whole grains, and beans-are healthier than others.   Finally, stay active  Signed, [name], DO  03/21/2020 2:42 PM    [facility]

## 2020-03-21 NOTE — Patient Instructions (Signed)
Medication Instructions:  Your physician has recommended you make the following change in your medication:   Take Lasix 40 mg on Monday, Wednesday and Friday. (non-dialysis days)  *If you need a refill on your cardiac medications before your next appointment, please call your pharmacy*   Lab Work: You had a BMET done today in the office. If you have labs (blood work) drawn today and your tests are completely normal, you will receive your results only by: [name] MyChart Message (if you have MyChart) OR . A paper copy in the mail If you have any lab test that is abnormal or we need to change your treatment, we will call you to review the results.   Testing/Procedures: Non-Cardiac CT Angiography (CTA), is a special type of CT scan that uses a computer to produce multi-dimensional views of major blood vessels throughout the body. In CT angiography, a contrast material is injected through an IV to help visualize the blood vessels   Follow-Up: At [facility], you and your health needs are our priority.  As part of our continuing mission to provide you with exceptional heart care, we have created designated Provider Care Teams.  These Care Teams include your primary Cardiologist (physician) and Advanced Practice Providers (APPs -  Physician Assistants and Nurse Practitioners) who all work together to provide you with the care you need, when you need it.  We recommend signing up for the patient portal called "MyChart".  Sign up information is provided on this After Visit Summary.  MyChart is used to connect with patients for Virtual Visits (Telemedicine).  Patients are able to view lab/test results, encounter notes, upcoming appointments, etc.  Non-urgent messages can be sent to your provider as well.   To learn more about what you can do with MyChart, go to [email or web address].    Your next appointment:   3 month(s)  The format for your next appointment:   In Person  Provider:   [name], DO   Other Instructions  CT Angiogram  A CT angiogram is a procedure to look at the blood vessels in various areas of the body. For this procedure, a large X-ray machine, called a CT scanner, takes detailed pictures of blood vessels that have been injected with a dye (contrast material). A CT angiogram allows your health care provider to see how well blood is flowing to the area of your body that is being checked. Your health care provider will be able to see if there are any problems, such as a blockage. Tell a health care provider about:  Any allergies you have.  All medicines you are taking, including vitamins, herbs, eye drops, creams, and over-the-counter medicines.  Any problems you or family members have had with anesthetic medicines.  Any blood disorders you have.  Any surgeries you have had.  Any medical conditions you have.  Whether you are pregnant or may be pregnant.  Whether you are breastfeeding.  Any anxiety disorders, chronic pain, or other conditions you have that may increase your stress or prevent you from lying still. What are the risks? Generally, this is a safe procedure. However, problems may occur, including:  Infection.  Bleeding.  Allergic reactions to medicines or dyes.  Damage to other structures or organs.  Kidney damage from the dye or contrast that is used.  Increased risk of cancer from radiation exposure. This risk is low. Talk with your health care provider about: ? The risks and benefits of testing. ? How you can  receive the lowest dose of radiation. What happens before the procedure?  Wear comfortable clothing and remove any jewelry.  Follow instructions from your health care provider about eating and drinking. For most people, instructions may include these actions: ? For 12 hours before the test, avoid caffeine. This includes tea, coffee, soda, and energy drinks or pills. ? For 3-4 hours before the test, stop eating or  drinking anything but water. ? Stay well hydrated by continuing to drink water before the exam. This will help to clear the contrast dye from your body after the test.  Ask your health care provider about changing or stopping your regular medicines. This is especially important if you are taking diabetes medicines or blood thinners. What happens during the procedure?  An IV tube will be inserted into one of your veins.  You will be asked to lie on an exam table. This table will slide in and out of the CT machine during the procedure.  Contrast dye will be injected into the IV tube. You might feel warm, or you may get a metallic taste in your mouth.  The table that you are lying on will move into the CT machine tunnel for the scan.  The person running the machine will give you instructions while the scans are being done. You may be asked to: ? Keep your arms above your head. ? Hold your breath. ? Stay very still, even if the table is moving.  When the scanning is complete, you will be moved out of the machine.  The IV tube will be removed. The procedure may vary among health care providers and hospitals. What happens after the procedure?  You might feel warm, or you may get a metallic taste in your mouth.  You may be asked to drink water or other fluids to wash (flush) the contrast material out of your body.  It is up to you to get the results of your procedure. Ask your health care provider, or the department that is doing the procedure, when your results will be ready. Summary  A CT angiogram is a procedure to look at the blood vessels in various areas of the body.  You will need to stay very still during the exam.  You may be asked to drink water or other fluids to wash (flush) the contrast material out of your body after your scan. This information is not intended to replace advice given to you by your health care provider. Make sure you discuss any questions you have with  your health care provider. Document Revised: 10/28/2018 Document Reviewed: 04/17/2016 Elsevier Patient Education  [place].

## 2020-03-22 LAB — BASIC METABOLIC PANEL
BUN/Creatinine Ratio: 7 — ABNORMAL LOW (ref 10–24)
BUN: 53 mg/dL — ABNORMAL HIGH (ref 8–27)
CO2: 25 mmol/L (ref 20–29)
Calcium: 10 mg/dL (ref 8.6–10.2)
Chloride: 96 mmol/L (ref 96–106)
Creatinine, Ser: 7.69 mg/dL — ABNORMAL HIGH (ref 0.76–1.27)
GFR calc Af Amer: 8 mL/min/{1.73_m2} — ABNORMAL LOW (ref >59)
GFR calc non Af Amer: 7 mL/min/{1.73_m2} — ABNORMAL LOW (ref >59)
Glucose: 88 mg/dL (ref 65–99)
Potassium: 5 mmol/L (ref 3.5–5.2)
Sodium: 141 mmol/L (ref 134–144)

## 2020-03-26 DIAGNOSIS — Z981 Arthrodesis status: Secondary | ICD-10-CM

## 2020-03-26 HISTORY — DX: Arthrodesis status: Z98.1

## 2020-03-29 DIAGNOSIS — Z4802 Encounter for removal of sutures: Secondary | ICD-10-CM | POA: Insufficient documentation

## 2020-03-29 HISTORY — DX: Encounter for removal of sutures: Z48.02

## 2020-04-12 ENCOUNTER — Ambulatory Visit (HOSPITAL_COMMUNITY): Admission: RE | Admit: 2020-04-12 | Payer: Medicare Other | Source: Ambulatory Visit

## 2020-05-21 ENCOUNTER — Other Ambulatory Visit: Payer: Medicare Other

## 2020-05-21 ENCOUNTER — Other Ambulatory Visit: Payer: Self-pay | Admitting: *Deleted

## 2020-05-21 DIAGNOSIS — I7121 Aneurysm of the ascending aorta, without rupture: Secondary | ICD-10-CM

## 2020-05-21 DIAGNOSIS — I712 Thoracic aortic aneurysm, without rupture, unspecified: Secondary | ICD-10-CM

## 2020-05-21 NOTE — Progress Notes (Signed)
BMET placed per protocol, for upcoming CT pt will have done here at our office on this Wednesday.  This is ordered by [name], DO. Checkout called triage requesting for a bmet to be placed, for pt will come here to have this lab drawn today.

## 2020-05-22 ENCOUNTER — Emergency Department (HOSPITAL_COMMUNITY)
Admission: EM | Admit: 2020-05-22 | Discharge: 2020-05-22 | Disposition: A | Discharge status: Home / Self Care | Payer: Medicare Other | Attending: Emergency Medicine | Admitting: Emergency Medicine

## 2020-05-22 ENCOUNTER — Encounter (HOSPITAL_COMMUNITY): Payer: Self-pay | Admitting: Emergency Medicine

## 2020-05-22 ENCOUNTER — Other Ambulatory Visit: Payer: Self-pay

## 2020-05-22 DIAGNOSIS — Z5321 Procedure and treatment not carried out due to patient leaving prior to being seen by health care provider: Secondary | ICD-10-CM | POA: Insufficient documentation

## 2020-05-22 DIAGNOSIS — R0602 Shortness of breath: Secondary | ICD-10-CM | POA: Insufficient documentation

## 2020-05-22 LAB — COMPREHENSIVE METABOLIC PANEL
ALT: 10 U/L (ref 0–44)
AST: 10 U/L — ABNORMAL LOW (ref 15–41)
Albumin: 3.7 g/dL (ref 3.5–5.0)
Alkaline Phosphatase: 74 U/L (ref 38–126)
Anion gap: 17 — ABNORMAL HIGH (ref 5–15)
BUN: 55 mg/dL — ABNORMAL HIGH (ref 8–23)
CO2: 26 mmol/L (ref 22–32)
Calcium: 10.8 mg/dL — ABNORMAL HIGH (ref 8.9–10.3)
Chloride: 99 mmol/L (ref 98–111)
Creatinine, Ser: 9.98 mg/dL — ABNORMAL HIGH (ref 0.61–1.24)
GFR calc Af Amer: 6 mL/min — ABNORMAL LOW (ref >60)
GFR calc non Af Amer: 5 mL/min — ABNORMAL LOW (ref >60)
Glucose, Bld: 101 mg/dL — ABNORMAL HIGH (ref 70–99)
Potassium: 5.4 mmol/L — ABNORMAL HIGH (ref 3.5–5.1)
Sodium: 142 mmol/L (ref 135–145)
Total Bilirubin: 1.1 mg/dL (ref 0.3–1.2)
Total Protein: 6.5 g/dL (ref 6.5–8.1)

## 2020-05-22 LAB — CBC
HCT: 27.9 % — ABNORMAL LOW (ref 39.0–52.0)
Hemoglobin: 8.8 g/dL — ABNORMAL LOW (ref 13.0–17.0)
MCH: 34.8 pg — ABNORMAL HIGH (ref 26.0–34.0)
MCHC: 31.5 g/dL (ref 30.0–36.0)
MCV: 110.3 fL — ABNORMAL HIGH (ref 80.0–100.0)
Platelets: 135 10*3/uL — ABNORMAL LOW (ref 150–400)
RBC: 2.53 MIL/uL — ABNORMAL LOW (ref 4.22–5.81)
RDW: 16.4 % — ABNORMAL HIGH (ref 11.5–15.5)
WBC: 5 10*3/uL (ref 4.0–10.5)
nRBC: 0 % (ref 0.0–0.2)

## 2020-05-22 NOTE — ED Notes (Signed)
Pt name called for updated vitals, still no response

## 2020-05-22 NOTE — ED Notes (Signed)
Pt name called for updated vitals, no response 

## 2020-05-22 NOTE — ED Triage Notes (Signed)
Patient has dialysis on T/TH/SAT. This past Saturday states only has about 2 hours. States was taken off early by staff member. Went to dialysis today and patient stated the staff told him they will not give him dialysis at that location anymore. Located on [address]. Patient has history of low pulse oximetry. Suppose to start home soon. Room air in triage 80's placed on 3L [place] increased to mid 90's. States continuously has shortness of breath.

## 2020-05-23 ENCOUNTER — Encounter (HOSPITAL_COMMUNITY): Payer: Self-pay

## 2020-05-23 ENCOUNTER — Encounter (HOSPITAL_COMMUNITY): Payer: Self-pay | Admitting: Emergency Medicine

## 2020-05-23 ENCOUNTER — Other Ambulatory Visit: Payer: Self-pay

## 2020-05-23 ENCOUNTER — Emergency Department (HOSPITAL_COMMUNITY): Payer: Medicare Other

## 2020-05-23 ENCOUNTER — Emergency Department (HOSPITAL_COMMUNITY)
Admission: RE | Admit: 2020-05-23 | Discharge: 2020-05-23 | Disposition: A | Discharge status: Home / Self Care | Payer: Medicare Other | Source: Ambulatory Visit | Attending: Cardiology | Admitting: Cardiology

## 2020-05-23 ENCOUNTER — Emergency Department (HOSPITAL_COMMUNITY)
Admission: EM | Admit: 2020-05-23 | Discharge: 2020-05-23 | Disposition: A | Discharge status: Home / Self Care | Payer: Medicare Other | Attending: Emergency Medicine | Admitting: Emergency Medicine

## 2020-05-23 DIAGNOSIS — I5033 Acute on chronic diastolic (congestive) heart failure: Secondary | ICD-10-CM | POA: Insufficient documentation

## 2020-05-23 DIAGNOSIS — I712 Thoracic aortic aneurysm, without rupture, unspecified: Secondary | ICD-10-CM

## 2020-05-23 DIAGNOSIS — Z79899 Other long term (current) drug therapy: Secondary | ICD-10-CM | POA: Insufficient documentation

## 2020-05-23 DIAGNOSIS — Z20822 Contact with and (suspected) exposure to covid-19: Secondary | ICD-10-CM | POA: Insufficient documentation

## 2020-05-23 DIAGNOSIS — R0602 Shortness of breath: Secondary | ICD-10-CM | POA: Diagnosis present

## 2020-05-23 DIAGNOSIS — J45909 Unspecified asthma, uncomplicated: Secondary | ICD-10-CM | POA: Insufficient documentation

## 2020-05-23 DIAGNOSIS — N186 End stage renal disease: Secondary | ICD-10-CM | POA: Diagnosis not present

## 2020-05-23 DIAGNOSIS — Z8673 Personal history of transient ischemic attack (TIA), and cerebral infarction without residual deficits: Secondary | ICD-10-CM | POA: Insufficient documentation

## 2020-05-23 DIAGNOSIS — I132 Hypertensive heart and chronic kidney disease with heart failure and with stage 5 chronic kidney disease, or end stage renal disease: Secondary | ICD-10-CM | POA: Insufficient documentation

## 2020-05-23 DIAGNOSIS — Z992 Dependence on renal dialysis: Secondary | ICD-10-CM | POA: Diagnosis not present

## 2020-05-23 LAB — CBC WITH DIFFERENTIAL/PLATELET
Abs Immature Granulocytes: 0.01 10*3/uL (ref 0.00–0.07)
Basophils Absolute: 0.1 10*3/uL (ref 0.0–0.1)
Basophils Relative: 1 %
Eosinophils Absolute: 0.4 10*3/uL (ref 0.0–0.5)
Eosinophils Relative: 8 %
HCT: 28.4 % — ABNORMAL LOW (ref 39.0–52.0)
Hemoglobin: 9.1 g/dL — ABNORMAL LOW (ref 13.0–17.0)
Immature Granulocytes: 0 %
Lymphocytes Relative: 9 %
Lymphs Abs: 0.4 10*3/uL — ABNORMAL LOW (ref 0.7–4.0)
MCH: 35.4 pg — ABNORMAL HIGH (ref 26.0–34.0)
MCHC: 32 g/dL (ref 30.0–36.0)
MCV: 110.5 fL — ABNORMAL HIGH (ref 80.0–100.0)
Monocytes Absolute: 0.4 10*3/uL (ref 0.1–1.0)
Monocytes Relative: 9 %
Neutro Abs: 3.3 10*3/uL (ref 1.7–7.7)
Neutrophils Relative %: 73 %
Platelets: 127 10*3/uL — ABNORMAL LOW (ref 150–400)
RBC: 2.57 MIL/uL — ABNORMAL LOW (ref 4.22–5.81)
RDW: 16.2 % — ABNORMAL HIGH (ref 11.5–15.5)
WBC: 4.6 10*3/uL (ref 4.0–10.5)
nRBC: 0 % (ref 0.0–0.2)

## 2020-05-23 LAB — BASIC METABOLIC PANEL
Anion gap: 17 — ABNORMAL HIGH (ref 5–15)
BUN: 59 mg/dL — ABNORMAL HIGH (ref 8–23)
CO2: 22 mmol/L (ref 22–32)
Calcium: 10 mg/dL (ref 8.9–10.3)
Chloride: 97 mmol/L — ABNORMAL LOW (ref 98–111)
Creatinine, Ser: 10.51 mg/dL — ABNORMAL HIGH (ref 0.61–1.24)
GFR calc Af Amer: 5 mL/min — ABNORMAL LOW (ref >60)
GFR calc non Af Amer: 5 mL/min — ABNORMAL LOW (ref >60)
Glucose, Bld: 85 mg/dL (ref 70–99)
Potassium: 4.4 mmol/L (ref 3.5–5.1)
Sodium: 136 mmol/L (ref 135–145)

## 2020-05-23 LAB — SARS CORONAVIRUS 2 BY RT PCR (HOSPITAL ORDER, PERFORMED IN ~~LOC~~ HOSPITAL LAB): SARS Coronavirus 2: NEGATIVE

## 2020-05-23 MED ORDER — CHLORHEXIDINE GLUCONATE CLOTH 2 % EX PADS: 6.0000 | MEDICATED_PAD | Freq: Every day | CUTANEOUS | Status: DC

## 2020-05-23 MED ORDER — SODIUM CHLORIDE 0.9 % IV SOLN: 100.0000 mL | INTRAVENOUS | Status: DC | PRN

## 2020-05-23 MED ORDER — ACETAMINOPHEN 325 MG PO TABS
ORAL_TABLET | ORAL | Status: AC
  Administered 2020-05-23: 650 mg
  Filled 2020-05-23: qty 2

## 2020-05-23 MED ORDER — LIDOCAINE-PRILOCAINE 2.5-2.5 % EX CREA: 1.0000 "application " | TOPICAL_CREAM | CUTANEOUS | Status: DC | PRN

## 2020-05-23 MED ORDER — PENTAFLUOROPROP-TETRAFLUOROETH EX AERO: 1.0000 "application " | INHALATION_SPRAY | CUTANEOUS | Status: DC | PRN

## 2020-05-23 MED ORDER — LIDOCAINE HCL (PF) 1 % IJ SOLN: 5.0000 mL | INTRAMUSCULAR | Status: DC | PRN

## 2020-05-23 MED ORDER — IOHEXOL 350 MG/ML SOLN
100.0000 mL | Freq: Once | INTRAVENOUS | Status: AC | PRN
  Administered 2020-05-23: 100 mL via INTRAVENOUS

## 2020-05-23 NOTE — ED Provider Notes (Addendum)
[facility] EMERGENCY DEPARTMENT Provider Note   CSN: [id number] Arrival date & time: 05/23/20  1049     History Chief Complaint  Patient presents with  . Shortness of Breath  . dialysis    Thomas Robinson is a 59 y.o. male.  Pt complains of shortness of breath. Pt is on dialysis.  Pt reports he can no longer go to his previous dialysis center ([facility]). He is awaiting scheduling at another facility.  Pt has been told he should come here.  Pt reports he called dialysis unit before coming. Pt complains of feeling short of breath.   The history is provided by the patient. No language interpreter was used.  Shortness of Breath Severity:  Severe Onset quality:  Gradual Duration:  2 days Timing:  Constant Progression:  Worsening Chronicity:  Recurrent Relieved by:  Nothing Worsened by:  Nothing Ineffective treatments:  None tried Associated symptoms: no abdominal pain, no diaphoresis, no fever and no sore throat        Past Medical History:  Diagnosis Date  . Acute kidney injury superimposed on CKD ([place]) 05/24/2017  . Acute on chronic diastolic CHF (congestive heart failure) ([place]) 04/13/2018  . AKI (acute kidney injury) ([place]) 04/10/2015  . Arthritis   . Asthma   . Asthma, chronic, unspecified asthma severity, with acute exacerbation 10/23/2017  . Atypical chest pain 02/16/2018  . CAP (community acquired pneumonia) 10/23/2017  . Cataract    right - removed by surgery  . Cerebral infarction due to thrombosis of cerebral artery ([place])   . Cerebrovascular accident (CVA) ([place])   . Chronic kidney disease   . CKD (chronic kidney disease), stage V ([place]) 05/24/2017  . Cough    chronic cough  . Dyspnea    "all the time" per patient at PAT 12/30/19  RA sats 87%, no oxygen use  . ESRD (end stage renal disease) on dialysis ([place]) 02/16/2018   Dialysis  T Thus Sat  - [facility]   . Essential hypertension 04/09/2015  . GERD (gastroesophageal reflux  disease) 06/04/2019  . GI bleeding 05/24/2017  . History of completed stroke 2018  . Hyperlipidemia   . Hypertension   . Hypertensive urgency 05/24/2017  . Kidney failure   . Oxygen deficiency 12/30/2019   O2 sats on RA 87% at PAT appt   . Respiratory failure, acute ([place]) 10/23/2017  . Restless leg syndrome   . Rib fractures 06/2019   Right  . Stroke ([place]) 2018  . Symptomatic anemia 05/24/2017  . Thoracic ascending aortic aneurysm ([place])    4.5 cm 06/04/19 CT    Patient Active Problem List   Diagnosis Date Noted  . Non-compliance with renal dialysis ([place]) 02/08/2020  . Acute pulmonary edema ([place]) 01/23/2020  . Cervical disc herniation 01/04/2020  . Tachycardia 01/04/2020  . SOB (shortness of breath)   . Rib fractures 06/05/2019  . Fall at home, initial encounter 06/04/2019  . Right rib fracture 06/04/2019  . Lung contusion 06/04/2019  . Acute respiratory failure with hypoxia ([place]) 06/04/2019  . Anemia in ESRD (end-stage renal disease) ([place]) 06/04/2019  . GERD (gastroesophageal reflux disease) 06/04/2019  . Chronic diastolic (congestive) heart failure ([place]) 06/04/2019  . Fall 06/04/2019  . Thoracic ascending aortic aneurysm ([place]) 06/04/2019  . Acute exacerbation of congestive heart failure ([place]) 05/16/2019  . Chest pain 04/13/2018  . Acute on chronic diastolic CHF (congestive heart failure) ([place]) 04/13/2018  . Chest pressure   . Atypical chest  pain 02/16/2018  . ESRD on dialysis ([place]) 02/16/2018  . Volume overload 10/23/2017  . Cough 08/23/2017  . Constipation   . Macrocytic anemia 05/24/2017  . Hypertensive urgency 05/24/2017  . History of completed stroke   . Essential hypertension 04/09/2015    Past Surgical History:  Procedure Laterality Date  . ANTERIOR CERVICAL DECOMP/DISCECTOMY FUSION N/A 01/04/2020   Procedure: ANTERIOR CERVICAL DECOMPRESSION/DISCECTOMY FUSION CERVICAL FIVE THROUGH SEVEN;  Surgeon: Melina Schools, MD;  Location: [facility];  Service: Orthopedics;   Laterality: N/A;  3 hrs  . AV FISTULA PLACEMENT Left 05/28/2017   Procedure: LEFT ARM ARTERIOVENOUS (AV) FISTULA CREATION;  Surgeon: Conrad [place], MD;  Location: [facility];  Service: Vascular;  Laterality: Left;  . EYE SURGERY Right 06/02/2019   Cataract removed  . FRACTURE SURGERY    . IR FLUORO GUIDE CV LINE RIGHT  05/25/2017  . IR US GUIDE VASC ACCESS RIGHT  05/25/2017  . REVISON OF ARTERIOVENOUS FISTULA Left 07/18/2019   Procedure: REVISION PLICATION OF RADIOCEPHALIC ARTERIOVENOUS FISTULA LEFT ARM;  Surgeon: Angelia Mould, MD;  Location: [facility];  Service: Vascular;  Laterality: Left;  . RINOPLASTY         Family History  Problem Relation Age of Onset  . Cancer Mother     Social History   Tobacco Use  . Smoking status: Never Smoker  . Smokeless tobacco: Never Used  Vaping Use  . Vaping Use: Never used  Substance Use Topics  . Alcohol use: Not Currently    Alcohol/week: 0.0 standard drinks    Comment: has a drink once in a while- 12/30/19  . Drug use: No    Home Medications Prior to Admission medications   Medication Sig Start Date End Date Taking? Authorizing Provider  AURYXIA 1 GM 210 MG(Fe) tablet Take 420 mg by mouth 3 (three) times daily. 03/16/20   [provider]  carvedilol (COREG) 12.5 MG tablet Take 1 tablet (12.5 mg total) by mouth 2 (two) times daily with a meal. 05/18/19   Martyn Malay, MD  cinacalcet (SENSIPAR) 60 MG tablet Take 60 mg by mouth daily. 11/11/18   [provider]  furosemide (LASIX) 40 MG tablet Take 40 mg of Lasix on non-dialysis days. 03/21/20   Tobb, Kardie, DO  hydrALAZINE (APRESOLINE) 100 MG tablet Take 1 tablet (100 mg total) by mouth every 8 (eight) hours. 05/18/19   Martyn Malay, MD  isosorbide mononitrate (IMDUR) 30 MG 24 hr tablet Take 1 tablet (30 mg total) by mouth daily. 07/15/19 03/21/20  Tobb, Kardie, DO  metoprolol tartrate (LOPRESSOR) 100 MG tablet Take 100 mg by mouth 2 (two) times daily.     [provider]  multivitamin (RENA-VIT) TABS tablet Take 1 tablet by mouth daily. 01/12/18   [provider]  sevelamer (RENAGEL) 800 MG tablet 800 mg. 01/25/18   [provider]  sevelamer carbonate (RENVELA) 800 MG tablet Take 800 mg by mouth daily. 11/07/19   [provider]    Allergies    Patient has no known allergies.  Review of Systems   Review of Systems  Constitutional: Negative for diaphoresis and fever.  HENT: Negative for sore throat.   Respiratory: Positive for shortness of breath.   Gastrointestinal: Negative for abdominal pain.  All other systems reviewed and are negative.   Physical Exam Updated Vital Signs BP (!) 177/81 (BP Location: Right Arm)   Pulse 63   Temp 97.6 F (36.4 C) (Oral)   Resp  17   Ht 5' 7.5" (1.715 m)   Wt 61.2 kg   SpO2 93%   BMI 20.83 kg/m   Physical Exam Vitals and nursing note reviewed.  Constitutional:      Appearance: He is well-developed.  HENT:     Head: Normocephalic and atraumatic.  Eyes:     Conjunctiva/sclera: Conjunctivae normal.  Cardiovascular:     Rate and Rhythm: Normal rate and regular rhythm.     Heart sounds: No murmur heard.   Pulmonary:     Effort: Pulmonary effort is normal. No respiratory distress.     Breath sounds: Normal breath sounds. No decreased breath sounds.  Abdominal:     Palpations: Abdomen is soft.     Tenderness: There is no abdominal tenderness.  Musculoskeletal:        General: Normal range of motion.     Cervical back: Normal range of motion and neck supple.  Skin:    General: Skin is warm and dry.  Neurological:     General: No focal deficit present.     Mental Status: He is alert.     ED Results / Procedures / Treatments   Labs (all labs ordered are listed, but only abnormal results are displayed) Labs Reviewed  CBC WITH DIFFERENTIAL/PLATELET - Abnormal; Notable for the following components:      Result Value   RBC 2.57 (*)    Hemoglobin 9.1 (*)     HCT 28.4 (*)    MCV 110.5 (*)    MCH 35.4 (*)    RDW 16.2 (*)    Platelets 127 (*)    All other components within normal limits  SARS CORONAVIRUS 2 BY RT PCR (HOSPITAL ORDER, [facility])  BASIC METABOLIC PANEL    EKG None  Radiology CT ANGIO CHEST AORTA W/CM & OR WO/CM  Result Date: 05/23/2020 CLINICAL DATA:  59 year old male with end-stage renal disease on hemodialysis. Additionally, he has in ascending thoracic aortic aneurysm. EXAM: CT ANGIOGRAPHY CHEST WITH CONTRAST TECHNIQUE: Multidetector CT imaging of the chest was performed using the standard protocol during bolus administration of intravenous contrast. Multiplanar CT image reconstructions and MIPs were obtained to evaluate the vascular anatomy. CONTRAST:  173mL OMNIPAQUE IOHEXOL 350 MG/ML SOLN COMPARISON:  Prior CTA chest 02/08/2020 FINDINGS: Cardiovascular: Fusiform aneurysmal dilation of the tubular portion of the ascending thoracic aorta. The maximal diameter is 4.5 cm. While this does measure larger than reported on the prior CT scan, measurement was difficult on the prior scan secondary to cardiac motion. By my measurement, the aorta measured at least 4.3 cm on the prior. Conventional 3 vessel arch anatomy. Mild scattered atherosclerotic calcifications. Calcifications are present along the coronary arteries. Stable cardiomegaly. No significant pericardial effusion. Unremarkable main pulmonary artery. Mediastinum/Nodes: Unremarkable CT appearance of the thyroid gland. No suspicious mediastinal or hilar adenopathy. No soft tissue mediastinal mass. The thoracic esophagus is unremarkable. Lungs/Pleura: Diffuse mild bronchial wall thickening. Mosaic attenuation with areas of ground-glass attenuation airspace opacity and areas of hyperinflation. Mild atelectasis present in the bilateral lower lobes. There may be trace interlobular septal thickening consistent with mild interstitial edema. Upper Abdomen: No acute  abnormality within the upper abdomen. The native kidneys are atrophied. Musculoskeletal: No acute fracture or aggressive appearing lytic or blastic osseous lesion. Review of the MIP images confirms the above findings. IMPRESSION: 1. Fusiform aneurysmal dilation of the tubular portion of the ascending thoracic aorta with a maximal transverse diameter of 4.5 cm. Previous measurements from  02/08/2020 were limited by significant cardiac motion artifact. Recommend semi-annual imaging followup by CTA or MRA and referral to cardiothoracic surgery if not already obtained. This recommendation follows 2010 ACCF/AHA/AATS/ACR/ASA/SCA/SCAI/SIR/STS/SVM Guidelines for the Diagnosis and Management of Patients With Thoracic Aortic Disease. Circulation. 2010; 121: V956-L875. Aortic aneurysm NOS (ICD10-I71.9); Aortic Atherosclerosis (ICD10-170.0) 2. Coronary artery calcifications. 3. Mosaic attenuation of the lungs with areas of mild ground-glass attenuation airspace opacity and areas of probable air trapping. This suggests underlying small airways or small vessels disease. 4. Diffuse mild bronchial wall thickening suggests chronic bronchitis. 5. Cardiomegaly with trace interstitial pulmonary edema. Electronically Signed   By: Jacqulynn Cadet M.D.   On: 05/23/2020 10:03    Procedures Procedures (including critical care time)  Medications Ordered in ED Medications - No data to display  ED Course  I have reviewed the triage vital signs and the nursing notes.  Pertinent labs & imaging results that were available during my care of the patient were reviewed by me and considered in my medical decision making (see chart for details).    MDM Rules/Calculators/A&P                          MDM: I spoke with Dr. Jonnie Finner.  He will set pt up for dialysis today Final Clinical Impression(s) / ED Diagnoses Final diagnoses:  SOB (shortness of breath)  End stage chronic kidney disease ([place])    Rx / DC Orders ED Discharge  Orders    None      Pt returned to Ed after dialysis.  Pt did not wait for reevaluation or discharge papers.  Fransico Meadow, PA-C 05/23/20 [place], PA-C 05/23/20 1825    Sherwood Gambler, MD 05/24/20 [phone]

## 2020-05-23 NOTE — ED Triage Notes (Signed)
Pt states he finished 1/2 of dialysis on Saturday.  States it is due today but he is in process of changing dialysis centers.  C/o SOB.  Denies pain.

## 2020-05-23 NOTE — Procedures (Signed)
Pt discharged from [facility] HD unit due to violent behavior while under behavior contract.  Presents to the ED today requesting dialysis. +SOB and ankle swelling. Coughing. No fevers. COVID is negative.   Pt seen in HD, mild rales at bases, 1+ ankle edema bilat, no distress.  Cor/ abd wnl.  Plan HD now and return to ED for re-assessment after HD completed. Pt aware that he doesn't have a current HD unit for regular HD and may present to the ED to request dialysis as needed.    I was present at this dialysis session, have reviewed the session itself and made  appropriate changes [name] MD [place] pager [phone]   05/23/2020, 2:54 PM

## 2020-05-28 ENCOUNTER — Ambulatory Visit (INDEPENDENT_AMBULATORY_CARE_PROVIDER_SITE_OTHER): Payer: Medicare Other | Admitting: Physician Assistant

## 2020-05-28 ENCOUNTER — Other Ambulatory Visit: Payer: Self-pay

## 2020-05-28 VITALS — BP 182/81 | HR 76 | Temp 98.0°F | Resp 20 | Ht 67.5 in | Wt 129.7 lb

## 2020-05-28 DIAGNOSIS — N186 End stage renal disease: Secondary | ICD-10-CM

## 2020-05-28 DIAGNOSIS — Z992 Dependence on renal dialysis: Secondary | ICD-10-CM | POA: Diagnosis not present

## 2020-05-28 NOTE — Progress Notes (Signed)
History of Present Illness:  Patient is a 59 y.o. year old male who presents for accesment of his left forearm av fistula.  He notes a 1 cm area at a vein bifurcation in the fistula that has become enlarged in the past few weeks.  It was not used as a stick site the skin is healthy and there is no surrounding ecchymosis.  He states he has not had problems with flow rates or prolonged bleeding while on HD.  He has had a previous plication of an aneurysmal area of the proximal fistula.  He denise pain, loss of sensation or loss of motor in the left UE.  Past Medical History:  Diagnosis Date  . Acute kidney injury superimposed on CKD ([place]) 05/24/2017  . Acute on chronic diastolic CHF (congestive heart failure) ([place]) 04/13/2018  . AKI (acute kidney injury) ([place]) 04/10/2015  . Arthritis   . Asthma   . Asthma, chronic, unspecified asthma severity, with acute exacerbation 10/23/2017  . Atypical chest pain 02/16/2018  . CAP (community acquired pneumonia) 10/23/2017  . Cataract    right - removed by surgery  . Cerebral infarction due to thrombosis of cerebral artery ([place])   . Cerebrovascular accident (CVA) ([place])   . Chronic kidney disease   . CKD (chronic kidney disease), stage V ([place]) 05/24/2017  . Cough    chronic cough  . Dyspnea    "all the time" per patient at PAT 12/30/19  RA sats 87%, no oxygen use  . ESRD (end stage renal disease) on dialysis ([place]) 02/16/2018   Dialysis  T Thus Sat  - [facility]   . Essential hypertension 04/09/2015  . GERD (gastroesophageal reflux disease) 06/04/2019  . GI bleeding 05/24/2017  . History of completed stroke 2018  . Hyperlipidemia   . Hypertension   . Hypertensive urgency 05/24/2017  . Kidney failure   . Oxygen deficiency 12/30/2019   O2 sats on RA 87% at PAT appt   . Respiratory failure, acute ([place]) 10/23/2017  . Restless leg syndrome   . Rib fractures 06/2019   Right  . Stroke ([place]) 2018  . Symptomatic anemia 05/24/2017  . Thoracic ascending  aortic aneurysm ([facility])    4.5 cm 06/04/19 CT    Past Surgical History:  Procedure Laterality Date  . ANTERIOR CERVICAL DECOMP/DISCECTOMY FUSION N/A 01/04/2020   Procedure: ANTERIOR CERVICAL DECOMPRESSION/DISCECTOMY FUSION CERVICAL FIVE THROUGH SEVEN;  Surgeon: Melina Schools, MD;  Location: [facility];  Service: Orthopedics;  Laterality: N/A;  3 hrs  . AV FISTULA PLACEMENT Left 05/28/2017   Procedure: LEFT ARM ARTERIOVENOUS (AV) FISTULA CREATION;  Surgeon: Conrad , MD;  Location: [facility];  Service: Vascular;  Laterality: Left;  . EYE SURGERY Right 06/02/2019   Cataract removed  . FRACTURE SURGERY    . IR FLUORO GUIDE CV LINE RIGHT  05/25/2017  . IR US GUIDE VASC ACCESS RIGHT  05/25/2017  . REVISON OF ARTERIOVENOUS FISTULA Left 07/18/2019   Procedure: REVISION PLICATION OF RADIOCEPHALIC ARTERIOVENOUS FISTULA LEFT ARM;  Surgeon: Angelia Mould, MD;  Location: [facility];  Service: Vascular;  Laterality: Left;  . RINOPLASTY       Social History Social History   Tobacco Use  . Smoking status: Never Smoker  . Smokeless tobacco: Never Used  Vaping Use  . Vaping Use: Never used  Substance Use Topics  . Alcohol use: Not Currently    Alcohol/week: 0.0 standard drinks    Comment: has a drink once  in a while- 12/30/19  . Drug use: No    Family History Family History  Problem Relation Age of Onset  . Cancer Mother     Allergies  No Known Allergies   Current Outpatient Medications  Medication Sig Dispense Refill  . albuterol (VENTOLIN HFA) 108 (90 Base) MCG/ACT inhaler Inhale into the lungs.    [name] amLODipine (NORVASC) 10 MG tablet amlodipine 10 mg tablet  TAKE 1 TABLET BY MOUTH EVERY DAY    . atorvastatin (LIPITOR) 20 MG tablet Take 20 mg by mouth daily.    Lorin Picket 1 GM 210 MG(Fe) tablet Take 420 mg by mouth 3 (three) times daily.    . carvedilol (COREG) 12.5 MG tablet Take 1 tablet (12.5 mg total) by mouth 2 (two) times daily with a meal. 60 tablet 0  . cinacalcet (SENSIPAR) 60  MG tablet Take 60 mg by mouth daily.    . furosemide (LASIX) 40 MG tablet Take 40 mg of Lasix on non-dialysis days. 45 tablet 3  . gabapentin (NEURONTIN) 300 MG capsule Take by mouth.    . hydrALAZINE (APRESOLINE) 100 MG tablet Take 1 tablet (100 mg total) by mouth every 8 (eight) hours. 90 tablet 0  . metoprolol tartrate (LOPRESSOR) 100 MG tablet Take 100 mg by mouth 2 (two) times daily.     . multivitamin (RENA-VIT) TABS tablet Take 1 tablet by mouth daily.    . sevelamer (RENAGEL) 800 MG tablet 800 mg.    . sevelamer carbonate (RENVELA) 800 MG tablet Take 800 mg by mouth daily.    . isosorbide mononitrate (IMDUR) 30 MG 24 hr tablet Take 1 tablet (30 mg total) by mouth daily. 90 tablet 1   No current facility-administered medications for this visit.    ROS:   General:  No weight loss, Fever, chills  HEENT: No recent headaches, no nasal bleeding, no visual changes, no sore throat  Neurologic: No dizziness, blackouts, seizures. No recent symptoms of stroke or mini- stroke. No recent episodes of slurred speech, or temporary blindness.  Cardiac: No recent episodes of chest pain/pressure, no shortness of breath at rest.  No shortness of breath with exertion.  Denies history of atrial fibrillation or irregular heartbeat  Vascular: No history of rest pain in feet.  No history of claudication.  No history of non-healing ulcer, No history of DVT   Pulmonary: No home oxygen, no productive cough, no hemoptysis,  No asthma or wheezing  Musculoskeletal:  [ ]  Arthritis, [x ] Low back pain,  [ ]  Joint pain  Hematologic:No history of hypercoagulable state.  No history of easy bleeding.  history history of anemia  Gastrointestinal: No hematochezia or melena,  No gastroesophageal reflux, no trouble swallowing  Urinary: [ ]  chronic Kidney disease, [x ] on HD - [ ]  MWF or [ ]  TTHS, [ ]  Burning with urination, [ ]  Frequent urination, [ ]  Difficulty urinating;   Skin: No rashes  Psychological: No  history of anxiety,  No history of depression   Physical Examination  Vitals:   05/28/20 0857  BP: (!) 182/81  Pulse: 76  Resp: 20  Temp: 98 F (36.7 C)  TempSrc: Temporal  SpO2: 90%  Weight: 129 lb 11.2 oz (58.8 kg)  Height: 5' 7.5" (1.715 m)    Body mass index is 20.01 kg/m.  General:  Alert and oriented, no acute distress HEENT: Normal Neck: No bruit or JVD Pulmonary: Clear to auscultation bilaterally Cardiac: Regular Rate and Rhythm without murmur Gastrointestinal:  Soft, non-tender, non-distended, no mass, no scars Skin: No rash Extremity Pulses:  2+ radial, brachial pulses bilaterally Musculoskeletal: No deformity or edema  Neurologic: Upper and lower extremity motor 5/5 and symmetric     ASSESSMENT:  Left forearm RC av fistula with excellent thrill  He notes a 1 cm area at a vein bifurcation in the fistula that has become enlarged in the past few weeks.  It is soft and compressible.It was not used as a stick site the skin is healthy and there is no surrounding ecchymosis.  He states he has not had problems with flow rates or prolonged bleeding while on HD.    PLAN:  If he has prolonged bleeding, development of large aneurysm after use, or skin break down he will f/u. I recommend avoding the small 1 cm aneurysmal area as a stick site and rotate stick site uses in general.   For now the fistula appears stable with good thrill.  F/U PRN  Roxy Horseman PA-C [facility] Office: [phone]  ND in clinic [place]

## 2020-05-30 ENCOUNTER — Other Ambulatory Visit: Payer: Self-pay

## 2020-05-30 ENCOUNTER — Emergency Department (HOSPITAL_COMMUNITY): Payer: Medicare Other

## 2020-05-30 ENCOUNTER — Emergency Department (HOSPITAL_COMMUNITY)
Admission: EM | Admit: 2020-05-30 | Discharge: 2020-05-31 | Disposition: A | Discharge status: Home / Self Care | Payer: Medicare Other | Attending: Emergency Medicine | Admitting: Emergency Medicine

## 2020-05-30 DIAGNOSIS — Z5321 Procedure and treatment not carried out due to patient leaving prior to being seen by health care provider: Secondary | ICD-10-CM | POA: Insufficient documentation

## 2020-05-30 DIAGNOSIS — R0602 Shortness of breath: Secondary | ICD-10-CM | POA: Diagnosis not present

## 2020-05-30 DIAGNOSIS — R110 Nausea: Secondary | ICD-10-CM | POA: Insufficient documentation

## 2020-05-30 DIAGNOSIS — R519 Headache, unspecified: Secondary | ICD-10-CM | POA: Diagnosis present

## 2020-05-30 DIAGNOSIS — Z992 Dependence on renal dialysis: Secondary | ICD-10-CM | POA: Insufficient documentation

## 2020-05-30 LAB — COMPREHENSIVE METABOLIC PANEL
ALT: 9 U/L (ref 0–44)
AST: 8 U/L — ABNORMAL LOW (ref 15–41)
Albumin: 4 g/dL (ref 3.5–5.0)
Alkaline Phosphatase: 84 U/L (ref 38–126)
Anion gap: 18 — ABNORMAL HIGH (ref 5–15)
BUN: 98 mg/dL — ABNORMAL HIGH (ref 8–23)
CO2: 20 mmol/L — ABNORMAL LOW (ref 22–32)
Calcium: 10.8 mg/dL — ABNORMAL HIGH (ref 8.9–10.3)
Chloride: 103 mmol/L (ref 98–111)
Creatinine, Ser: 13.52 mg/dL — ABNORMAL HIGH (ref 0.61–1.24)
GFR calc Af Amer: 4 mL/min — ABNORMAL LOW (ref >60)
GFR calc non Af Amer: 3 mL/min — ABNORMAL LOW (ref >60)
Glucose, Bld: 130 mg/dL — ABNORMAL HIGH (ref 70–99)
Potassium: 5.4 mmol/L — ABNORMAL HIGH (ref 3.5–5.1)
Sodium: 141 mmol/L (ref 135–145)
Total Bilirubin: 1.4 mg/dL — ABNORMAL HIGH (ref 0.3–1.2)
Total Protein: 7.1 g/dL (ref 6.5–8.1)

## 2020-05-30 LAB — CBC
HCT: 26.6 % — ABNORMAL LOW (ref 39.0–52.0)
Hemoglobin: 8.8 g/dL — ABNORMAL LOW (ref 13.0–17.0)
MCH: 35.6 pg — ABNORMAL HIGH (ref 26.0–34.0)
MCHC: 33.1 g/dL (ref 30.0–36.0)
MCV: 107.7 fL — ABNORMAL HIGH (ref 80.0–100.0)
Platelets: 146 10*3/uL — ABNORMAL LOW (ref 150–400)
RBC: 2.47 MIL/uL — ABNORMAL LOW (ref 4.22–5.81)
RDW: 14.3 % (ref 11.5–15.5)
WBC: 4.6 10*3/uL (ref 4.0–10.5)
nRBC: 0 % (ref 0.0–0.2)

## 2020-05-30 NOTE — ED Triage Notes (Signed)
Pt's last full dialysis tx was last Thursday. Trying to find another dialysis center d/t personal problems with the tech there so has not been in one week. Endorses headache, shortness of breath, and nausea.

## 2020-05-30 NOTE — ED Notes (Signed)
Called for vitals and no response

## 2020-05-31 ENCOUNTER — Emergency Department (HOSPITAL_COMMUNITY)
Admission: EM | Admit: 2020-05-31 | Discharge: 2020-06-04 | Disposition: A | Discharge status: Home / Self Care | Payer: Medicare Other | Attending: Emergency Medicine | Admitting: Emergency Medicine

## 2020-05-31 DIAGNOSIS — R079 Chest pain, unspecified: Secondary | ICD-10-CM | POA: Insufficient documentation

## 2020-05-31 DIAGNOSIS — R600 Localized edema: Secondary | ICD-10-CM | POA: Diagnosis not present

## 2020-05-31 DIAGNOSIS — J45901 Unspecified asthma with (acute) exacerbation: Secondary | ICD-10-CM | POA: Insufficient documentation

## 2020-05-31 DIAGNOSIS — Z79899 Other long term (current) drug therapy: Secondary | ICD-10-CM | POA: Diagnosis not present

## 2020-05-31 DIAGNOSIS — I132 Hypertensive heart and chronic kidney disease with heart failure and with stage 5 chronic kidney disease, or end stage renal disease: Secondary | ICD-10-CM | POA: Diagnosis not present

## 2020-05-31 DIAGNOSIS — Z8673 Personal history of transient ischemic attack (TIA), and cerebral infarction without residual deficits: Secondary | ICD-10-CM | POA: Insufficient documentation

## 2020-05-31 DIAGNOSIS — R0602 Shortness of breath: Secondary | ICD-10-CM | POA: Diagnosis present

## 2020-05-31 DIAGNOSIS — Z992 Dependence on renal dialysis: Secondary | ICD-10-CM | POA: Diagnosis not present

## 2020-05-31 DIAGNOSIS — Z20822 Contact with and (suspected) exposure to covid-19: Secondary | ICD-10-CM | POA: Diagnosis not present

## 2020-05-31 DIAGNOSIS — I712 Thoracic aortic aneurysm, without rupture: Secondary | ICD-10-CM | POA: Diagnosis not present

## 2020-05-31 DIAGNOSIS — J9601 Acute respiratory failure with hypoxia: Secondary | ICD-10-CM

## 2020-05-31 DIAGNOSIS — R519 Headache, unspecified: Secondary | ICD-10-CM | POA: Insufficient documentation

## 2020-05-31 DIAGNOSIS — I5033 Acute on chronic diastolic (congestive) heart failure: Secondary | ICD-10-CM | POA: Insufficient documentation

## 2020-05-31 DIAGNOSIS — R058 Other specified cough: Secondary | ICD-10-CM | POA: Diagnosis not present

## 2020-05-31 DIAGNOSIS — N186 End stage renal disease: Secondary | ICD-10-CM | POA: Insufficient documentation

## 2020-05-31 DIAGNOSIS — E875 Hyperkalemia: Secondary | ICD-10-CM

## 2020-05-31 DIAGNOSIS — E8771 Transfusion associated circulatory overload: Secondary | ICD-10-CM

## 2020-05-31 DIAGNOSIS — J449 Chronic obstructive pulmonary disease, unspecified: Secondary | ICD-10-CM | POA: Insufficient documentation

## 2020-05-31 LAB — RESPIRATORY PANEL BY RT PCR (FLU A&B, COVID)
Influenza A by PCR: NEGATIVE
Influenza B by PCR: NEGATIVE
SARS Coronavirus 2 by RT PCR: NEGATIVE

## 2020-05-31 MED ORDER — CHLORHEXIDINE GLUCONATE CLOTH 2 % EX PADS: 6.0000 | MEDICATED_PAD | Freq: Every day | CUTANEOUS | Status: DC

## 2020-05-31 MED ORDER — SODIUM ZIRCONIUM CYCLOSILICATE 10 G PO PACK
10.0000 g | PACK | Freq: Once | ORAL | Status: AC
  Administered 2020-05-31: 10 g via ORAL
  Filled 2020-05-31: qty 1

## 2020-05-31 MED ORDER — PENTAFLUOROPROP-TETRAFLUOROETH EX AERO: 1.0000 "application " | INHALATION_SPRAY | CUTANEOUS | Status: DC | PRN

## 2020-05-31 MED ORDER — SODIUM CHLORIDE 0.9 % IV SOLN: 100.0000 mL | INTRAVENOUS | Status: DC | PRN

## 2020-05-31 MED ORDER — LIDOCAINE HCL (PF) 1 % IJ SOLN: 5.0000 mL | INTRAMUSCULAR | Status: DC | PRN

## 2020-05-31 MED ORDER — ACETAMINOPHEN 325 MG PO TABS
ORAL_TABLET | ORAL | Status: AC
  Administered 2020-05-31: 325 mg
  Filled 2020-05-31: qty 1

## 2020-05-31 MED ORDER — LIDOCAINE-PRILOCAINE 2.5-2.5 % EX CREA: 1.0000 "application " | TOPICAL_CREAM | CUTANEOUS | Status: DC | PRN

## 2020-05-31 MED ORDER — ONDANSETRON 4 MG PO TBDP
4.0000 mg | ORAL_TABLET | Freq: Once | ORAL | Status: AC
  Administered 2020-05-31: 4 mg via ORAL
  Filled 2020-05-31: qty 1

## 2020-05-31 MED ORDER — HEPARIN SODIUM (PORCINE) 1000 UNIT/ML DIALYSIS
1000.0000 [IU] | INTRAMUSCULAR | Status: DC | PRN
  Filled 2020-05-31: qty 1

## 2020-05-31 MED ORDER — ALTEPLASE 2 MG IJ SOLR: 2.0000 mg | Freq: Once | INTRAMUSCULAR | Status: DC | PRN

## 2020-05-31 NOTE — ED Notes (Signed)
No answer for vitals x2 

## 2020-05-31 NOTE — ED Notes (Signed)
Ordered renal diet tray for pt 

## 2020-05-31 NOTE — ED Provider Notes (Signed)
Brief update note  Patient seen initially by Mia PA.  59 year old dialysis patient, in need of dialysis.  Arrangements made with nephrology for dialysis.  Discharge anticipated after dialysis.  2:16 PM received notification by RN staff, patient had completed dialysis without complications and is ready to be discharged, I assessed patient he is well-appearing with stable vital signs on room air, no ongoing complaints, will discharge home   Lucrezia Starch, MD 05/31/20 1416

## 2020-05-31 NOTE — ED Provider Notes (Signed)
[facility] DEPARTMENT Provider Note   CSN: [id number] Arrival date & time: 05/31/20  0446     History Chief Complaint  Patient presents with  . Shortness of Breath    Thomas Robinson is a 59 y.o. male with a h/o of ESRD on hemodialysis, chronic diastolic congestive heart failure, asthma, COPD, HTN, HLD, CVA who presents the emergency department by EMS with a chief complaint of shortness of breath.  The patient endorses progressively worsening shortness of breath over the last 5 days accompanied by a productive cough with intermittent brown sputum.  He endorses associated leg swelling, headache, and chest pain.  Chest pain is sharp.  He reports a history of similar consistent with when he needs dialysis.  He had one episode of nonbloody, nonbilious vomiting earlier today.  No fever, chills, syncope, diarrhea, abdominal pain, or rash.  EMS noted that the patient had oxygen saturation of 82-84% on room air on arrival.  He was placed on 3L [place] and O2 improved to 84%.  EMS reportedly heard feet scattered wheeze in the left lower lung lobe and he was given albuterol nebulizer and 0.5 of Atrovent and 125 of Solu-Medrol.  On re-evaluation, wheezing resolved.   The patient was discharged from his dialysis center and has not been dialyzed since 9/22.  He came to the ER approximately 12 to 13 hours ago and had labs, EKG, and chest x-ray performed.  Unfortunately, he left due to long weights in the emergency department.  When he went home, he attempted to try and sleep, but was feeling so poorly that he ultimately called EMS to return to the ER.  Per note from Dr. Jonnie Finner on 9/22 "Pt aware that he doesn't have a current HD unit for regular HD and may present to the ED to request dialysis as needed."    Patient also reports that he has developed an area on his left AV fistula that he is concerned about. He had a previous plication of an aneurysmal area of the proximal fistula.   He was seen and evaluated by vascular surgery on 9/27 who recommended avoiding the small 1 cm aneurysmal area as a stick site and rotate stick site use is in general.  Fistula.  It is stable with a good thrill and he was advised to follow-up with you prolonged bleeding  The history is provided by the patient and medical records. No language interpreter was used.       Past Medical History:  Diagnosis Date  . Acute kidney injury superimposed on CKD ([place]) 05/24/2017  . Acute on chronic diastolic CHF (congestive heart failure) ([place]) 04/13/2018  . AKI (acute kidney injury) ([place]) 04/10/2015  . Arthritis   . Asthma   . Asthma, chronic, unspecified asthma severity, with acute exacerbation 10/23/2017  . Atypical chest pain 02/16/2018  . CAP (community acquired pneumonia) 10/23/2017  . Cataract    right - removed by surgery  . Cerebral infarction due to thrombosis of cerebral artery ([place])   . Cerebrovascular accident (CVA) ([place])   . Chronic kidney disease   . CKD (chronic kidney disease), stage V ([place]) 05/24/2017  . Cough    chronic cough  . Dyspnea    "all the time" per patient at PAT 12/30/19  RA sats 87%, no oxygen use  . ESRD (end stage renal disease) on dialysis ([place]) 02/16/2018   Dialysis  T Thus Sat  - [facility]   . Essential hypertension 04/09/2015  .  GERD (gastroesophageal reflux disease) 06/04/2019  . GI bleeding 05/24/2017  . History of completed stroke 2018  . Hyperlipidemia   . Hypertension   . Hypertensive urgency 05/24/2017  . Kidney failure   . Oxygen deficiency 12/30/2019   O2 sats on RA 87% at PAT appt   . Respiratory failure, acute ([place]) 10/23/2017  . Restless leg syndrome   . Rib fractures 06/2019   Right  . Stroke ([place]) 2018  . Symptomatic anemia 05/24/2017  . Thoracic ascending aortic aneurysm ([place])    4.5 cm 06/04/19 CT    Patient Active Problem List   Diagnosis Date Noted  . Non-compliance with renal dialysis ([place]) 02/08/2020  . Acute pulmonary edema  ([place]) 01/23/2020  . Cervical disc herniation 01/04/2020  . Tachycardia 01/04/2020  . SOB (shortness of breath)   . Rib fractures 06/05/2019  . Fall at home, initial encounter 06/04/2019  . Right rib fracture 06/04/2019  . Lung contusion 06/04/2019  . Acute respiratory failure with hypoxia ([place]) 06/04/2019  . Anemia in ESRD (end-stage renal disease) ([place]) 06/04/2019  . GERD (gastroesophageal reflux disease) 06/04/2019  . Chronic diastolic (congestive) heart failure ([place]) 06/04/2019  . Fall 06/04/2019  . Thoracic ascending aortic aneurysm ([place]) 06/04/2019  . Acute exacerbation of congestive heart failure ([place]) 05/16/2019  . Chest pain 04/13/2018  . Acute on chronic diastolic CHF (congestive heart failure) ([place]) 04/13/2018  . Chest pressure   . Atypical chest pain 02/16/2018  . ESRD on dialysis ([place]) 02/16/2018  . Volume overload 10/23/2017  . Cough 08/23/2017  . Constipation   . Macrocytic anemia 05/24/2017  . Hypertensive urgency 05/24/2017  . History of completed stroke   . Essential hypertension 04/09/2015    Past Surgical History:  Procedure Laterality Date  . ANTERIOR CERVICAL DECOMP/DISCECTOMY FUSION N/A 01/04/2020   Procedure: ANTERIOR CERVICAL DECOMPRESSION/DISCECTOMY FUSION CERVICAL FIVE THROUGH SEVEN;  Surgeon: Melina Schools, MD;  Location: [facility];  Service: Orthopedics;  Laterality: N/A;  3 hrs  . AV FISTULA PLACEMENT Left 05/28/2017   Procedure: LEFT ARM ARTERIOVENOUS (AV) FISTULA CREATION;  Surgeon: Conrad [place]sey [place], MD;  Location: [facility];  Service: Vascular;  Laterality: Left;  . EYE SURGERY Right 06/02/2019   Cataract removed  . FRACTURE SURGERY    . IR FLUORO GUIDE CV LINE RIGHT  05/25/2017  . IR US GUIDE VASC ACCESS RIGHT  05/25/2017  . REVISON OF ARTERIOVENOUS FISTULA Left 07/18/2019   Procedure: REVISION PLICATION OF RADIOCEPHALIC ARTERIOVENOUS FISTULA LEFT ARM;  Surgeon: Angelia Mould, MD;  Location: [facility];  Service: Vascular;  Laterality: Left;  .  RINOPLASTY         Family History  Problem Relation Age of Onset  . Cancer Mother     Social History   Tobacco Use  . Smoking status: Never Smoker  . Smokeless tobacco: Never Used  Vaping Use  . Vaping Use: Never used  Substance Use Topics  . Alcohol use: Not Currently    Alcohol/week: 0.0 standard drinks    Comment: has a drink once in a while- 12/30/19  . Drug use: No    Home Medications Prior to Admission medications   Medication Sig Start Date End Date Taking? Authorizing Provider  albuterol (VENTOLIN HFA) 108 (90 Base) MCG/ACT inhaler Inhale into the lungs. 05/23/20   [provider]  amLODipine (NORVASC) 10 MG tablet amlodipine 10 mg tablet  TAKE 1 TABLET BY MOUTH EVERY DAY    [provider]  atorvastatin (LIPITOR) 20 MG tablet  Take 20 mg by mouth daily. 04/19/20   [provider]  AURYXIA 1 GM 210 MG(Fe) tablet Take 420 mg by mouth 3 (three) times daily. 03/16/20   [provider]  carvedilol (COREG) 12.5 MG tablet Take 1 tablet (12.5 mg total) by mouth 2 (two) times daily with a meal. 05/18/19   Martyn Malay, MD  cinacalcet (SENSIPAR) 60 MG tablet Take 60 mg by mouth daily. 11/11/18   [provider]  furosemide (LASIX) 40 MG tablet Take 40 mg of Lasix on non-dialysis days. 03/21/20   Tobb, Kardie, DO  gabapentin (NEURONTIN) 300 MG capsule Take by mouth.    [provider]  hydrALAZINE (APRESOLINE) 100 MG tablet Take 1 tablet (100 mg total) by mouth every 8 (eight) hours. 05/18/19   Martyn Malay, MD  isosorbide mononitrate (IMDUR) 30 MG 24 hr tablet Take 1 tablet (30 mg total) by mouth daily. 07/15/19 03/21/20  Tobb, Kardie, DO  metoprolol tartrate (LOPRESSOR) 100 MG tablet Take 100 mg by mouth 2 (two) times daily.     [provider]  multivitamin (RENA-VIT) TABS tablet Take 1 tablet by mouth daily. 01/12/18   [provider]  sevelamer (RENAGEL) 800 MG tablet 800 mg. 01/25/18   [provider]  sevelamer carbonate (RENVELA) 800 MG tablet Take 800 mg by mouth daily. 11/07/19   [provider]    Allergies    Patient has no known allergies.  Review of Systems   Review of Systems  Constitutional: Negative for appetite change, chills and fever.  Respiratory: Positive for cough, shortness of breath and wheezing (resolved).   Cardiovascular: Positive for chest pain and leg swelling. Negative for palpitations.  Gastrointestinal: Positive for nausea and vomiting. Negative for abdominal pain, constipation and diarrhea.  Genitourinary: Negative for dysuria.  Musculoskeletal: Negative for back pain.  Skin: Negative for rash.  Allergic/Immunologic: Negative for immunocompromised state.  Neurological: Negative for headaches.  Psychiatric/Behavioral: Negative for confusion.    Physical Exam Updated Vital Signs BP (!) 194/86 (BP Location: Right Arm)   Pulse 80   Temp 98.2 F (36.8 C)   Resp (!) 25   Ht 5\' 8"  (1.727 m)   Wt 60.3 kg   SpO2 99%   BMI 20.22 kg/m   Physical Exam Vitals and nursing note reviewed.  Constitutional:      Appearance: He is well-developed.     Comments: Nasal cannula in place.  No acute distress.  HENT:     Head: Normocephalic.  Eyes:     Conjunctiva/sclera: Conjunctivae normal.  Cardiovascular:     Rate and Rhythm: Normal rate and regular rhythm.     Heart sounds: No murmur heard.   Pulmonary:     Effort: Pulmonary effort is normal.     Breath sounds: No wheezing.     Comments: Crackles in the bilateral bases.  Patient is able to speak in complete, fluent sentences.  No retractions or accessory muscle use, but minimally tachypneic. Abdominal:     General: There is no distension.     Palpations: Abdomen is soft. There is no mass.     Tenderness: There is no abdominal tenderness. There is no right CVA tenderness, left CVA tenderness, guarding or rebound.     Hernia: No hernia is present.  Musculoskeletal:     Cervical back: Neck  supple.     Right lower leg: Edema present.     Left lower leg: Edema present.     Comments:  Palpable thrill in the left AV fistula.  Skin:    General: Skin is warm and dry.  Neurological:     Mental Status: He is alert.  Psychiatric:        Behavior: Behavior normal.     ED Results / Procedures / Treatments   Labs (all labs ordered are listed, but only abnormal results are displayed) Labs Reviewed  RESPIRATORY PANEL BY RT PCR (FLU A&B, COVID)    EKG None  Radiology DG Chest 2 View  Result Date: 05/30/2020 CLINICAL DATA:  Shortness of breath EXAM: CHEST - 2 VIEW COMPARISON:  05/23/2020, 12/30/2019 chest CT 05/23/2020 FINDINGS: Possible small left pleural effusion. Cardiomegaly. Mild diffuse interstitial and ground-glass opacity. Aortic atherosclerosis. No pneumothorax. Surgical hardware in the cervical spine. IMPRESSION: Cardiomegaly with mild diffuse interstitial and ground-glass opacity, possible edema. Possible small left pleural effusion. Electronically Signed   By: Donavan Foil M.D.   On: 05/30/2020 17:24    Procedures .Critical Care Performed by: Joanne Gavel, PA-C Authorized by: Joanne Gavel, PA-C   Critical care provider statement:    Critical care time (minutes):  35   Critical care time was exclusive of:  Separately billable procedures and treating other patients and teaching time   Critical care was necessary to treat or prevent imminent or life-threatening deterioration of the following conditions:  Metabolic crisis and respiratory failure   Critical care was time spent personally by me on the following activities:  Ordering and performing treatments and interventions, ordering and review of laboratory studies, ordering and review of radiographic studies, pulse oximetry, re-evaluation of patient's condition, review of old charts, obtaining history from patient or surrogate, examination of patient, evaluation of patient's response to treatment, discussions with  consultants and development of treatment plan with patient or surrogate   I assumed direction of critical care for this patient from another provider in my specialty: no     (including critical care time)  Medications Ordered in ED Medications  ondansetron (ZOFRAN-ODT) disintegrating tablet 4 mg (4 mg Oral Given 05/31/20 0531)  sodium zirconium cyclosilicate (LOKELMA) packet 10 g (10 g Oral Given 05/31/20 0531)    ED Course  I have reviewed the triage vital signs and the nursing notes.  Pertinent labs & imaging results that were available during my care of the patient were reviewed by me and considered in my medical decision making (see chart for details).  Clinical Course as of Jun 01 719  Thu May 31, 2020  0449 Spoke with Dr. Joelyn Oms.  He will get the patient on the dialysis scheduled for today.  Patient should likely be able to be discharged to home after he has dialyzed.  I will also place a consult with the renal navigator to help coordinate finding him a new dialysis facility.   [MM]    Clinical Course User Index [MM] Dawsen Krieger, Laymond Purser, PA-C   MDM Rules/Calculators/A&P                          59 year old male with a h/o of ESRD on hemodialysis, chronic diastolic congestive heart failure, asthma, COPD, HTN, HLD, CVA who presents to the emergency department by EMS with shortness of breath, chest pain, cough, and vomiting x1.  Patient was recently discharged from his dialysis facility and will require dialysis at the hospital until a new facility has been found.  He was last dialyzed on 9/22.  He was found to be hypoxic at  82 to 84% when EMS arrived and he was placed on 3 L nasal cannula.  With resolution of hypoxia.  He was given albuterol and Atrovent in route as well as Solu-Medrol as he had faint scattered wheezes that has since resolved.  Patient was seen in the ER earlier today, but left before being evaluated after he waited for many hours due to long wait times from boarding  in the ER.  On exam, he appears volume overloaded.  Labs from patient's ER visit approximately 12 hours ago have been reviewed.  Creatinine 13.52.  He was mildly hyperkalemic at 5.4.  Initial EKG from visit yesterday has been reviewed and repeated.  There does appear to be interval increase in peaked T waves.  Previous potassium was 5.4.  We will give Lokelma.  He also received albuterol in route.  Will defer repeat labs at this time.  Patient is hypertensive secondary to volume overload and will require dialysis today.  Spoke with Dr. Joelyn Oms, nephrology, who will coordinate patient receiving dialysis today.  I have also placed a consult with renal navigator, Terri Piedra, to use assist with finding the patient a new hemodialysis center.  Patient care transferred to [place] at the end of my shift to follow the patient until he is able to be dialyzed and if he returns to the ER for reevaluation. Patient presentation, ED course, and plan of care discussed with review of all pertinent labs and imaging. Please see his/her note for further details regarding further ED course and disposition.  Final Clinical Impression(s) / ED Diagnoses Final diagnoses:  None    Rx / DC Orders ED Discharge Orders    None       Joanne Gavel, PA-C 05/31/20 0720    Mesner, Corene Cornea, MD 06/01/20 [phone]

## 2020-05-31 NOTE — ED Notes (Signed)
No answer for vitals x1 

## 2020-05-31 NOTE — ED Triage Notes (Signed)
Pt BIB EMS from home c.o worsening SOB and productive cough (brown sputum) since Saturday. RA sating 82% , NRB 100, [place] 3L @ 94 %. Lungs clear, small wheeze in lower left lobe per EMS. HX dialysis, ( last treatment 05/24/20)- r/t change facilities, asthma.   22 G R. HAND ALBUTEROL 5 MG NEB , 0.5 ATROVENT NEB , 125 SOLU-MEDROL
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
## 2020-06-06 ENCOUNTER — Emergency Department (HOSPITAL_COMMUNITY)
Admission: EM | Admit: 2020-06-06 | Discharge: 2020-06-07 | Disposition: A | Discharge status: Home / Self Care | Payer: Medicare Other | Attending: Emergency Medicine | Admitting: Emergency Medicine

## 2020-06-06 ENCOUNTER — Emergency Department (HOSPITAL_COMMUNITY): Payer: Medicare Other

## 2020-06-06 DIAGNOSIS — N186 End stage renal disease: Secondary | ICD-10-CM | POA: Insufficient documentation

## 2020-06-06 DIAGNOSIS — J45909 Unspecified asthma, uncomplicated: Secondary | ICD-10-CM | POA: Insufficient documentation

## 2020-06-06 DIAGNOSIS — R0602 Shortness of breath: Secondary | ICD-10-CM

## 2020-06-06 DIAGNOSIS — Z992 Dependence on renal dialysis: Secondary | ICD-10-CM | POA: Diagnosis not present

## 2020-06-06 DIAGNOSIS — R112 Nausea with vomiting, unspecified: Secondary | ICD-10-CM

## 2020-06-06 DIAGNOSIS — I5033 Acute on chronic diastolic (congestive) heart failure: Secondary | ICD-10-CM | POA: Insufficient documentation

## 2020-06-06 DIAGNOSIS — I132 Hypertensive heart and chronic kidney disease with heart failure and with stage 5 chronic kidney disease, or end stage renal disease: Secondary | ICD-10-CM | POA: Insufficient documentation

## 2020-06-06 DIAGNOSIS — Z20822 Contact with and (suspected) exposure to covid-19: Secondary | ICD-10-CM | POA: Insufficient documentation

## 2020-06-06 DIAGNOSIS — Z79899 Other long term (current) drug therapy: Secondary | ICD-10-CM | POA: Diagnosis not present

## 2020-06-06 LAB — HEPATIC FUNCTION PANEL
ALT: 8 U/L (ref 0–44)
AST: 9 U/L — ABNORMAL LOW (ref 15–41)
Albumin: 3.7 g/dL (ref 3.5–5.0)
Alkaline Phosphatase: 62 U/L (ref 38–126)
Bilirubin, Direct: <0.1 mg/dL (ref 0.0–0.2)
Total Bilirubin: 1.1 mg/dL (ref 0.3–1.2)
Total Protein: 6.4 g/dL — ABNORMAL LOW (ref 6.5–8.1)

## 2020-06-06 LAB — LIPASE, BLOOD: Lipase: 37 U/L (ref 11–51)

## 2020-06-06 LAB — RESPIRATORY PANEL BY RT PCR (FLU A&B, COVID)
Influenza A by PCR: NEGATIVE
Influenza B by PCR: NEGATIVE
SARS Coronavirus 2 by RT PCR: NEGATIVE

## 2020-06-06 LAB — BASIC METABOLIC PANEL
Anion gap: 20 — ABNORMAL HIGH (ref 5–15)
BUN: 86 mg/dL — ABNORMAL HIGH (ref 8–23)
CO2: 20 mmol/L — ABNORMAL LOW (ref 22–32)
Calcium: 10.5 mg/dL — ABNORMAL HIGH (ref 8.9–10.3)
Chloride: 98 mmol/L (ref 98–111)
Creatinine, Ser: 11.73 mg/dL — ABNORMAL HIGH (ref 0.61–1.24)
GFR calc non Af Amer: 4 mL/min — ABNORMAL LOW (ref >60)
Glucose, Bld: 119 mg/dL — ABNORMAL HIGH (ref 70–99)
Potassium: 4.7 mmol/L (ref 3.5–5.1)
Sodium: 138 mmol/L (ref 135–145)

## 2020-06-06 LAB — CBC
HCT: 24.8 % — ABNORMAL LOW (ref 39.0–52.0)
Hemoglobin: 8.1 g/dL — ABNORMAL LOW (ref 13.0–17.0)
MCH: 34.3 pg — ABNORMAL HIGH (ref 26.0–34.0)
MCHC: 32.7 g/dL (ref 30.0–36.0)
MCV: 105.1 fL — ABNORMAL HIGH (ref 80.0–100.0)
Platelets: 159 10*3/uL (ref 150–400)
RBC: 2.36 MIL/uL — ABNORMAL LOW (ref 4.22–5.81)
RDW: 14 % (ref 11.5–15.5)
WBC: 5.7 10*3/uL (ref 4.0–10.5)
nRBC: 0 % (ref 0.0–0.2)

## 2020-06-06 LAB — HEPATITIS B SURFACE ANTIGEN: Hepatitis B Surface Ag: NONREACTIVE

## 2020-06-06 LAB — TROPONIN I (HIGH SENSITIVITY): Troponin I (High Sensitivity): 68 ng/L — ABNORMAL HIGH (ref <18)

## 2020-06-06 MED ORDER — HEPARIN SODIUM (PORCINE) 1000 UNIT/ML DIALYSIS
3000.0000 [IU] | INTRAMUSCULAR | Status: DC | PRN
  Filled 2020-06-06 (×2): qty 3

## 2020-06-06 MED ORDER — ALBUTEROL SULFATE (2.5 MG/3ML) 0.083% IN NEBU
5.0000 mg | INHALATION_SOLUTION | Freq: Once | RESPIRATORY_TRACT | Status: AC
  Administered 2020-06-06: 5 mg via RESPIRATORY_TRACT
  Filled 2020-06-06: qty 6

## 2020-06-06 MED ORDER — ONDANSETRON HCL 4 MG/2ML IJ SOLN
4.0000 mg | Freq: Once | INTRAMUSCULAR | Status: AC
  Administered 2020-06-06: 4 mg via INTRAVENOUS
  Filled 2020-06-06: qty 2

## 2020-06-06 MED ORDER — HEPARIN SODIUM (PORCINE) 1000 UNIT/ML IJ SOLN
INTRAMUSCULAR | Status: AC
  Administered 2020-06-06: 3000 [IU] via INTRAVENOUS_CENTRAL
  Filled 2020-06-06: qty 3

## 2020-06-06 MED ORDER — CHLORHEXIDINE GLUCONATE CLOTH 2 % EX PADS: 6.0000 | MEDICATED_PAD | Freq: Every day | CUTANEOUS | Status: DC

## 2020-06-06 NOTE — Care Management (Signed)
ED CM called HD concerning patient and was told patient was sent back down to the ED.  CM was unable to locate him in the ED CM will try to reach him by phone.

## 2020-06-06 NOTE — ED Triage Notes (Signed)
Pt presents to ED POV. Pt c/o SOB. Pt reports that he has not had dialysis in 1w and reports that they have not found placement for dialysis center. Pt 89% on RA, placed on 2L [place]

## 2020-06-06 NOTE — ED Notes (Signed)
Admitting MD at bedside. Pt will be taken to [facility] soon.

## 2020-06-06 NOTE — ED Provider Notes (Signed)
[facility] EMERGENCY DEPARTMENT Provider Note   CSN: [id number] Arrival date & time: 06/06/20  0645     History Chief Complaint  Patient presents with  . Shortness of Breath    Thomas Robinson is a 59 y.o. male.  59 year old male with past medical history below including ESRD on HD, CVA, CHF, chronic respiratory failure, thoracic aortic aneurysm who presents with shortness of breath.  Patient has been trying to switch dialysis centers and because of this, he has not been able to receive usual outpatient dialysis.  His last dialysis session was 6 days ago.  He reports worsening shortness of breath and 1 day of cough which he usually gets when he has extra fluid on his lungs.  He reports chest tightness.  He has had malaise and decreased appetite.  He reports some nausea and vomiting starting last night into this morning.  He reports some abdominal pain but states this is from exercise.  No significant change in lower extremity edema.  No fevers or sick contacts.  He has not yet had his morning medications.  Of note, recent PCP visit shows that patient has chronic hypoxia and is supposed to be on 2 L chronically, is still working on getting home oxygen set up.  The history is provided by the patient.  Shortness of Breath      Past Medical History:  Diagnosis Date  . Acute kidney injury superimposed on CKD ([place]) 05/24/2017  . Acute on chronic diastolic CHF (congestive heart failure) ([place]) 04/13/2018  . AKI (acute kidney injury) ([place]) 04/10/2015  . Arthritis   . Asthma   . Asthma, chronic, unspecified asthma severity, with acute exacerbation 10/23/2017  . Atypical chest pain 02/16/2018  . CAP (community acquired pneumonia) 10/23/2017  . Cataract    right - removed by surgery  . Cerebral infarction due to thrombosis of cerebral artery ([place])   . Cerebrovascular accident (CVA) ([place])   . Chronic kidney disease   . CKD (chronic kidney disease), stage V ([place]) 05/24/2017  .  Cough    chronic cough  . Dyspnea    "all the time" per patient at PAT 12/30/19  RA sats 87%, no oxygen use  . ESRD (end stage renal disease) on dialysis ([place]) 02/16/2018   Dialysis  T Thus Sat  - [facility]   . Essential hypertension 04/09/2015  . GERD (gastroesophageal reflux disease) 06/04/2019  . GI bleeding 05/24/2017  . History of completed stroke 2018  . Hyperlipidemia   . Hypertension   . Hypertensive urgency 05/24/2017  . Kidney failure   . Oxygen deficiency 12/30/2019   O2 sats on RA 87% at PAT appt   . Respiratory failure, acute ([place]) 10/23/2017  . Restless leg syndrome   . Rib fractures 06/2019   Right  . Stroke ([place]) 2018  . Symptomatic anemia 05/24/2017  . Thoracic ascending aortic aneurysm ([place])    4.5 cm 06/04/19 CT    Patient Active Problem List   Diagnosis Date Noted  . Non-compliance with renal dialysis ([place]) 02/08/2020  . Acute pulmonary edema ([place]) 01/23/2020  . Cervical disc herniation 01/04/2020  . Tachycardia 01/04/2020  . SOB (shortness of breath)   . Rib fractures 06/05/2019  . Fall at home, initial encounter 06/04/2019  . Right rib fracture 06/04/2019  . Lung contusion 06/04/2019  . Acute respiratory failure with hypoxia ([place]) 06/04/2019  . Anemia in ESRD (end-stage renal disease) ([place]) 06/04/2019  . GERD (gastroesophageal reflux disease)  06/04/2019  . Chronic diastolic (congestive) heart failure ([place]) 06/04/2019  . Fall 06/04/2019  . Thoracic ascending aortic aneurysm ([place]) 06/04/2019  . Acute exacerbation of congestive heart failure ([place]) 05/16/2019  . Chest pain 04/13/2018  . Acute on chronic diastolic CHF (congestive heart failure) ([place]) 04/13/2018  . Chest pressure   . Atypical chest pain 02/16/2018  . ESRD on dialysis ([place]) 02/16/2018  . Volume overload 10/23/2017  . Cough 08/23/2017  . Constipation   . Macrocytic anemia 05/24/2017  . Hypertensive urgency 05/24/2017  . History of completed stroke   . Essential hypertension  04/09/2015    Past Surgical History:  Procedure Laterality Date  . ANTERIOR CERVICAL DECOMP/DISCECTOMY FUSION N/A 01/04/2020   Procedure: ANTERIOR CERVICAL DECOMPRESSION/DISCECTOMY FUSION CERVICAL FIVE THROUGH SEVEN;  Surgeon: Melina Schools, MD;  Location: [facility];  Service: Orthopedics;  Laterality: N/A;  3 hrs  . AV FISTULA PLACEMENT Left 05/28/2017   Procedure: LEFT ARM ARTERIOVENOUS (AV) FISTULA CREATION;  Surgeon: Conrad Hominy, MD;  Location: [facility];  Service: Vascular;  Laterality: Left;  . EYE SURGERY Right 06/02/2019   Cataract removed  . FRACTURE SURGERY    . IR FLUORO GUIDE CV LINE RIGHT  05/25/2017  . IR US GUIDE VASC ACCESS RIGHT  05/25/2017  . REVISON OF ARTERIOVENOUS FISTULA Left 07/18/2019   Procedure: REVISION PLICATION OF RADIOCEPHALIC ARTERIOVENOUS FISTULA LEFT ARM;  Surgeon: Angelia Mould, MD;  Location: [facility];  Service: Vascular;  Laterality: Left;  . RINOPLASTY         Family History  Problem Relation Age of Onset  . Cancer Mother     Social History   Tobacco Use  . Smoking status: Never Smoker  . Smokeless tobacco: Never Used  Vaping Use  . Vaping Use: Never used  Substance Use Topics  . Alcohol use: Not Currently    Alcohol/week: 0.0 standard drinks    Comment: has a drink once in a while- 12/30/19  . Drug use: No    Home Medications Prior to Admission medications   Medication Sig Start Date End Date Taking? Authorizing Provider  albuterol (VENTOLIN HFA) 108 (90 Base) MCG/ACT inhaler Inhale 1-2 puffs into the lungs every 4 (four) hours as needed for wheezing.  05/23/20  Yes [provider]  amLODipine (NORVASC) 10 MG tablet Take 10 mg by mouth daily.    Yes [provider]  atorvastatin (LIPITOR) 20 MG tablet Take 20 mg by mouth daily. 04/19/20  Yes [provider]  AURYXIA 1 GM 210 MG(Fe) tablet Take 420 mg by mouth 3 (three) times daily. 03/16/20  Yes [provider]  cinacalcet (SENSIPAR) 60 MG tablet Take 60  mg by mouth daily. 11/11/18  Yes [provider]  furosemide (LASIX) 40 MG tablet Take 40 mg of Lasix on non-dialysis days. Patient taking differently: Take 40 mg by mouth See admin instructions. On Sunday, Monday, Wednesday, Thursday and Saturday 03/21/20  Yes Tobb, Kardie, DO  gabapentin (NEURONTIN) 300 MG capsule Take 300 mg by mouth 2 (two) times daily as needed (pain).    Yes [provider]  hydrALAZINE (APRESOLINE) 25 MG tablet Take 50 mg by mouth 3 (three) times daily. 06/02/20  Yes [provider]  isosorbide mononitrate (IMDUR) 30 MG 24 hr tablet Take 1 tablet (30 mg total) by mouth daily. 07/15/19 06/06/29 Yes Tobb, Kardie, DO  metoprolol tartrate (LOPRESSOR) 100 MG tablet Take 100 mg by mouth 2 (two) times daily.    Yes [provider]  multivitamin (RENA-VIT) TABS tablet Take 1 tablet by mouth daily. 01/12/18  Yes [provider]  sevelamer carbonate (RENVELA) 800 MG tablet Take 800 mg by mouth daily. 11/07/19  Yes [provider]  carvedilol (COREG) 12.5 MG tablet Take 1 tablet (12.5 mg total) by mouth 2 (two) times daily with a meal. Patient not taking: Reported on 05/31/2020 05/18/19   Martyn Malay, MD  hydrALAZINE (APRESOLINE) 100 MG tablet Take 1 tablet (100 mg total) by mouth every 8 (eight) hours. Patient not taking: Reported on 06/06/2020 05/18/19   Martyn Malay, MD    Allergies    Patient has no known allergies.  Review of Systems   Review of Systems  Respiratory: Positive for shortness of breath.    All other systems reviewed and are negative except that which was mentioned in HPI  Physical Exam Updated Vital Signs BP (!) 148/74   Pulse 72   Temp 97.8 F (36.6 C) (Oral)   Resp 17   SpO2 95%   Physical Exam Vitals and nursing note reviewed.  Constitutional:      General: He is not in acute distress. HENT:     Head: Normocephalic and atraumatic.  Eyes:     Conjunctiva/sclera: Conjunctivae normal.    Cardiovascular:     Rate and Rhythm: Normal rate and regular rhythm.     Heart sounds: Murmur heard.   Pulmonary:     Effort: Pulmonary effort is normal.     Breath sounds: Normal breath sounds.  Abdominal:     General: There is no distension.     Palpations: Abdomen is soft.     Tenderness: There is no abdominal tenderness.  Musculoskeletal:     Cervical back: Neck supple.     Comments: Trace ankle edema b/l  Skin:    General: Skin is warm and dry.  Neurological:     Mental Status: He is alert and oriented to person, place, and time.     Comments: Fluent speech  Psychiatric:        Behavior: Behavior normal.        Judgment: Judgment normal.     ED Results / Procedures / Treatments   Labs (all labs ordered are listed, but only abnormal results are displayed) Labs Reviewed  BASIC METABOLIC PANEL - Abnormal; Notable for the following components:      Result Value   CO2 20 (*)    Glucose, Bld 119 (*)    BUN 86 (*)    Creatinine, Ser 11.73 (*)    Calcium 10.5 (*)    GFR calc non Af Amer 4 (*)    Anion gap 20 (*)    All other components within normal limits  CBC - Abnormal; Notable for the following components:   RBC 2.36 (*)    Hemoglobin 8.1 (*)    HCT 24.8 (*)    MCV 105.1 (*)    MCH 34.3 (*)    All other components within normal limits  HEPATIC FUNCTION PANEL - Abnormal; Notable for the following components:   Total Protein 6.4 (*)    AST 9 (*)    All other components within normal limits  TROPONIN I (HIGH SENSITIVITY) - Abnormal; Notable for the following components:   Troponin I (High Sensitivity) 68 (*)    All other components within normal limits  RESPIRATORY PANEL BY RT PCR (FLU A&B, COVID)  LIPASE, BLOOD    EKG EKG Interpretation  Date/Time:  Wednesday June 06 2020  06:49:42 EDT Ventricular Rate:  82 PR Interval:  206 QRS Duration: 100 QT Interval:  394 QTC Calculation: 460 R Axis:   75 Text Interpretation: Normal sinus rhythm Possible Left  atrial enlargement Left ventricular hypertrophy with repolarization abnormality ( Cornell product ) Abnormal ECG some artifact but overall similar to previous Confirmed by Theotis Burrow [phone]) on 06/06/2020 6:58:31 AM   Radiology DG Chest 2 View  Result Date: 06/06/2020 CLINICAL DATA:  Shortness of breath, missed dialysis EXAM: CHEST - 2 VIEW COMPARISON:  May 30, 2020 FINDINGS: The cardiomediastinal silhouette is unchanged and enlarged in contour. Trace LEFT pleural effusion. No pneumothorax. Persistent mild diffuse interstitial prominence with peripheral vascular indistinctness. Visualized abdomen is unremarkable. Status post ACDF. IMPRESSION: Similar appearance of mild pulmonary edema and trace LEFT pleural effusion. Electronically Signed   By: Valentino Saxon MD   On: 06/06/2020 07:47    Procedures Procedures (including critical care time)  Medications Ordered in ED Medications  albuterol (PROVENTIL) (2.5 MG/3ML) 0.083% nebulizer solution 5 mg (5 mg Nebulization Given 06/06/20 0819)  ondansetron (ZOFRAN) injection 4 mg (4 mg Intravenous Given 06/06/20 0817)    ED Course  I have reviewed the triage vital signs and the nursing notes.  Pertinent labs & imaging results that were available during my care of the patient were reviewed by me and considered in my medical decision making (see chart for details).    MDM Rules/Calculators/A&P                          No respiratory distress on exam, stable on 2L O2 which appears to be the amount of oxygen he's supposed to be on chronically. EKG similar to previous, K 4.7, CXR w/ similar mild pulmonary edema as previous. Trop 68 similar to previous, LFTs and lipase normal.  Discussed w/ nephrology, Dr. Jonnie Finner; they will dialyze the patient here today. Pt going to dialysis now and can likely be discharged upon return to ED. Of note, pt is supposed to be on home O2 but doesn't have it. I have contacted SW and Mariann Laster is working on this issue.   Final Clinical Impression(s) / ED Diagnoses Final diagnoses:  ESRD needing dialysis ([place])  SOB (shortness of breath)  Non-intractable vomiting with nausea, unspecified vomiting type    Rx / DC Orders ED Discharge Orders    None       Geri Hepler, Wenda Overland, MD 06/06/20 1557

## 2020-06-06 NOTE — Procedures (Addendum)
Pt seen and examined. Asked to see for dialysis.    Pt has no outpatient HD unit, he was discharged from [facility] after behavior issues which were a repeated problem. Here for HD today. Has leg edema and mild rales on exam. Will UF max 3- 4 L. Labs are okay. Told him he should dialyze in hospital again on Fri or Sat this week , that 1 time per week is not enough.  He said his old HD unit is looking for a HD unit for him in [place].    Will dc back to ED when HD completed.   I was present at this dialysis session, have reviewed the session itself and made  appropriate changes [name] MD [place] pager [phone]   06/06/2020, 4:09 PM

## 2020-06-11 ENCOUNTER — Emergency Department (HOSPITAL_COMMUNITY)
Admission: EM | Admit: 2020-06-11 | Discharge: 2020-06-11 | Disposition: A | Discharge status: Home / Self Care | Payer: Medicare Other | Attending: Emergency Medicine | Admitting: Emergency Medicine

## 2020-06-11 ENCOUNTER — Other Ambulatory Visit: Payer: Self-pay

## 2020-06-11 ENCOUNTER — Emergency Department (HOSPITAL_COMMUNITY): Payer: Medicare Other

## 2020-06-11 DIAGNOSIS — Z79899 Other long term (current) drug therapy: Secondary | ICD-10-CM | POA: Diagnosis not present

## 2020-06-11 DIAGNOSIS — I132 Hypertensive heart and chronic kidney disease with heart failure and with stage 5 chronic kidney disease, or end stage renal disease: Secondary | ICD-10-CM | POA: Diagnosis not present

## 2020-06-11 DIAGNOSIS — Z992 Dependence on renal dialysis: Secondary | ICD-10-CM | POA: Diagnosis not present

## 2020-06-11 DIAGNOSIS — E875 Hyperkalemia: Secondary | ICD-10-CM | POA: Diagnosis not present

## 2020-06-11 DIAGNOSIS — I5033 Acute on chronic diastolic (congestive) heart failure: Secondary | ICD-10-CM | POA: Diagnosis not present

## 2020-06-11 DIAGNOSIS — J45909 Unspecified asthma, uncomplicated: Secondary | ICD-10-CM | POA: Diagnosis not present

## 2020-06-11 DIAGNOSIS — Z20822 Contact with and (suspected) exposure to covid-19: Secondary | ICD-10-CM | POA: Insufficient documentation

## 2020-06-11 DIAGNOSIS — N186 End stage renal disease: Secondary | ICD-10-CM

## 2020-06-11 DIAGNOSIS — R0602 Shortness of breath: Secondary | ICD-10-CM | POA: Diagnosis present

## 2020-06-11 LAB — BASIC METABOLIC PANEL
Anion gap: 19 — ABNORMAL HIGH (ref 5–15)
BUN: 80 mg/dL — ABNORMAL HIGH (ref 8–23)
CO2: 19 mmol/L — ABNORMAL LOW (ref 22–32)
Calcium: 9.9 mg/dL (ref 8.9–10.3)
Chloride: 101 mmol/L (ref 98–111)
Creatinine, Ser: 10.7 mg/dL — ABNORMAL HIGH (ref 0.61–1.24)
GFR, Estimated: 5 mL/min — ABNORMAL LOW (ref >60)
Glucose, Bld: 88 mg/dL (ref 70–99)
Potassium: 5.6 mmol/L — ABNORMAL HIGH (ref 3.5–5.1)
Sodium: 139 mmol/L (ref 135–145)

## 2020-06-11 LAB — RESPIRATORY PANEL BY RT PCR (FLU A&B, COVID)
Influenza A by PCR: NEGATIVE
Influenza B by PCR: NEGATIVE
SARS Coronavirus 2 by RT PCR: NEGATIVE

## 2020-06-11 LAB — CBC
HCT: 26.4 % — ABNORMAL LOW (ref 39.0–52.0)
Hemoglobin: 8.6 g/dL — ABNORMAL LOW (ref 13.0–17.0)
MCH: 35.2 pg — ABNORMAL HIGH (ref 26.0–34.0)
MCHC: 32.6 g/dL (ref 30.0–36.0)
MCV: 108.2 fL — ABNORMAL HIGH (ref 80.0–100.0)
Platelets: 146 10*3/uL — ABNORMAL LOW (ref 150–400)
RBC: 2.44 MIL/uL — ABNORMAL LOW (ref 4.22–5.81)
RDW: 13.5 % (ref 11.5–15.5)
WBC: 4.3 10*3/uL (ref 4.0–10.5)
nRBC: 0 % (ref 0.0–0.2)

## 2020-06-11 LAB — I-STAT CHEM 8, ED
BUN: 89 mg/dL — ABNORMAL HIGH (ref 8–23)
Calcium, Ion: 1.22 mmol/L (ref 1.15–1.40)
Chloride: 105 mmol/L (ref 98–111)
Creatinine, Ser: 11.9 mg/dL — ABNORMAL HIGH (ref 0.61–1.24)
Glucose, Bld: 87 mg/dL (ref 70–99)
HCT: 21 % — ABNORMAL LOW (ref 39.0–52.0)
Hemoglobin: 7.1 g/dL — ABNORMAL LOW (ref 13.0–17.0)
Potassium: 5.6 mmol/L — ABNORMAL HIGH (ref 3.5–5.1)
Sodium: 137 mmol/L (ref 135–145)
TCO2: 22 mmol/L (ref 22–32)

## 2020-06-11 LAB — TROPONIN I (HIGH SENSITIVITY): Troponin I (High Sensitivity): 56 ng/L — ABNORMAL HIGH (ref <18)

## 2020-06-11 MED ORDER — HEPARIN SODIUM (PORCINE) 1000 UNIT/ML DIALYSIS: 1000.0000 [IU] | INTRAMUSCULAR | Status: DC | PRN

## 2020-06-11 MED ORDER — PENTAFLUOROPROP-TETRAFLUOROETH EX AERO: 1.0000 "application " | INHALATION_SPRAY | CUTANEOUS | Status: DC | PRN

## 2020-06-11 MED ORDER — SODIUM CHLORIDE 0.9 % IV SOLN: 100.0000 mL | INTRAVENOUS | Status: DC | PRN

## 2020-06-11 MED ORDER — ALTEPLASE 2 MG IJ SOLR: 2.0000 mg | Freq: Once | INTRAMUSCULAR | Status: DC | PRN

## 2020-06-11 MED ORDER — LIDOCAINE HCL (PF) 1 % IJ SOLN: 5.0000 mL | INTRAMUSCULAR | Status: DC | PRN

## 2020-06-11 MED ORDER — LIDOCAINE-PRILOCAINE 2.5-2.5 % EX CREA: 1.0000 "application " | TOPICAL_CREAM | CUTANEOUS | Status: DC | PRN

## 2020-06-11 MED ORDER — CHLORHEXIDINE GLUCONATE CLOTH 2 % EX PADS: 6.0000 | MEDICATED_PAD | Freq: Every day | CUTANEOUS | Status: DC

## 2020-06-11 NOTE — ED Provider Notes (Signed)
[facility] EMERGENCY DEPARTMENT Provider Note   CSN: [id number] Arrival date & time: 06/11/20  0530     History Chief Complaint  Patient presents with  . Shortness of Breath    Thomas Robinson is a 59 y.o. male.  Thomas Robinson is a 59 y.o. male with a PMH of ESRD, hypertension, and pulmonary edema who presented today for emergenct dialysis. pt stated that his last dialysis was on 10/6. Pt is currently in the process of finding a new dialysis center but is not currently able to receive regular outpatient dialysis.  He reports foot swelling up to the ankle that he noticed this morning and shortness of breath that he stated feels similar to his worsening shortness of breath due to pulmonary edema when he is in need of dalysis. Pt has no complaints at this time. He denies any chest pain, new shortness of breath out of the ordinary for him, fever, chills, abdominal pain, or muscle cramping.        Past Medical History:  Diagnosis Date  . Acute kidney injury superimposed on CKD ([place]) 05/24/2017  . Acute on chronic diastolic CHF (congestive heart failure) ([place]) 04/13/2018  . AKI (acute kidney injury) ([employer]) 04/10/2015  . Arthritis   . Asthma   . Asthma, chronic, unspecified asthma severity, with acute exacerbation 10/23/2017  . Atypical chest pain 02/16/2018  . CAP (community acquired pneumonia) 10/23/2017  . Cataract    right - removed by surgery  . Cerebral infarction due to thrombosis of cerebral artery ([place])   . Cerebrovascular accident (CVA) ([place])   . Chronic kidney disease   . CKD (chronic kidney disease), stage V ([place]) 05/24/2017  . Cough    chronic cough  . Dyspnea    "all the time" per patient at PAT 12/30/19  RA sats 87%, no oxygen use  . ESRD (end stage renal disease) on dialysis ([place]) 02/16/2018   Dialysis  T Thus Sat  - [facility]   . Essential hypertension 04/09/2015  . GERD (gastroesophageal reflux disease) 06/04/2019  . GI bleeding  05/24/2017  . History of completed stroke 2018  . Hyperlipidemia   . Hypertension   . Hypertensive urgency 05/24/2017  . Kidney failure   . Oxygen deficiency 12/30/2019   O2 sats on RA 87% at PAT appt   . Respiratory failure, acute ([place]) 10/23/2017  . Restless leg syndrome   . Rib fractures 06/2019   Right  . Stroke ([place]) 2018  . Symptomatic anemia 05/24/2017  . Thoracic ascending aortic aneurysm ([place])    4.5 cm 06/04/19 CT    Patient Active Problem List   Diagnosis Date Noted  . Non-compliance with renal dialysis ([place]) 02/08/2020  . Acute pulmonary edema ([place]) 01/23/2020  . Cervical disc herniation 01/04/2020  . Tachycardia 01/04/2020  . SOB (shortness of breath)   . Rib fractures 06/05/2019  . Fall at home, initial encounter 06/04/2019  . Right rib fracture 06/04/2019  . Lung contusion 06/04/2019  . Acute respiratory failure with hypoxia ([place]) 06/04/2019  . Anemia in ESRD (end-stage renal disease) ([place]) 06/04/2019  . GERD (gastroesophageal reflux disease) 06/04/2019  . Chronic diastolic (congestive) heart failure ([place]) 06/04/2019  . Fall 06/04/2019  . Thoracic ascending aortic aneurysm ([place]) 06/04/2019  . Acute exacerbation of congestive heart failure ([place]) 05/16/2019  . Chest pain 04/13/2018  . Acute on chronic diastolic CHF (congestive heart failure) ([place]) 04/13/2018  . Chest pressure   . Atypical chest pain  02/16/2018  . ESRD on dialysis ([place]) 02/16/2018  . Volume overload 10/23/2017  . Cough 08/23/2017  . Constipation   . Macrocytic anemia 05/24/2017  . Hypertensive urgency 05/24/2017  . History of completed stroke   . Essential hypertension 04/09/2015    Past Surgical History:  Procedure Laterality Date  . ANTERIOR CERVICAL DECOMP/DISCECTOMY FUSION N/A 01/04/2020   Procedure: ANTERIOR CERVICAL DECOMPRESSION/DISCECTOMY FUSION CERVICAL FIVE THROUGH SEVEN;  Surgeon: Melina Schools, MD;  Location: [facility];  Service: Orthopedics;  Laterality: N/A;  3 hrs  . AV FISTULA  PLACEMENT Left 05/28/2017   Procedure: LEFT ARM ARTERIOVENOUS (AV) FISTULA CREATION;  Surgeon: Conrad [place]tica, MD;  Location: [facility];  Service: Vascular;  Laterality: Left;  . EYE SURGERY Right 06/02/2019   Cataract removed  . FRACTURE SURGERY    . IR FLUORO GUIDE CV LINE RIGHT  05/25/2017  . IR US GUIDE VASC ACCESS RIGHT  05/25/2017  . REVISON OF ARTERIOVENOUS FISTULA Left 07/18/2019   Procedure: REVISION PLICATION OF RADIOCEPHALIC ARTERIOVENOUS FISTULA LEFT ARM;  Surgeon: Angelia Mould, MD;  Location: [facility];  Service: Vascular;  Laterality: Left;  . RINOPLASTY         Family History  Problem Relation Age of Onset  . Cancer Mother     Social History   Tobacco Use  . Smoking status: Never Smoker  . Smokeless tobacco: Never Used  Vaping Use  . Vaping Use: Never used  Substance Use Topics  . Alcohol use: Not Currently    Alcohol/week: 0.0 standard drinks    Comment: has a drink once in a while- 12/30/19  . Drug use: No    Home Medications Prior to Admission medications   Medication Sig Start Date End Date Taking? Authorizing Provider  albuterol (VENTOLIN HFA) 108 (90 Base) MCG/ACT inhaler Inhale 1-2 puffs into the lungs every 4 (four) hours as needed for wheezing.  05/23/20   [provider]  amLODipine (NORVASC) 10 MG tablet Take 10 mg by mouth daily.     [provider]  atorvastatin (LIPITOR) 20 MG tablet Take 20 mg by mouth daily. 04/19/20   [provider]  AURYXIA 1 GM 210 MG(Fe) tablet Take 420 mg by mouth 3 (three) times daily. 03/16/20   [provider]  carvedilol (COREG) 12.5 MG tablet Take 1 tablet (12.5 mg total) by mouth 2 (two) times daily with a meal. Patient not taking: Reported on 05/31/2020 05/18/19   Martyn Malay, MD  cinacalcet (SENSIPAR) 60 MG tablet Take 60 mg by mouth daily. 11/11/18   [provider]  furosemide (LASIX) 40 MG tablet Take 40 mg of Lasix on non-dialysis days. Patient taking differently: Take  40 mg by mouth See admin instructions. On Sunday, Monday, Wednesday, Thursday and Saturday 03/21/20   Tobb, Kardie, DO  gabapentin (NEURONTIN) 300 MG capsule Take 300 mg by mouth 2 (two) times daily as needed (pain).     [provider]  hydrALAZINE (APRESOLINE) 100 MG tablet Take 1 tablet (100 mg total) by mouth every 8 (eight) hours. Patient not taking: Reported on 06/06/2020 05/18/19   Martyn Malay, MD  hydrALAZINE (APRESOLINE) 25 MG tablet Take 50 mg by mouth 3 (three) times daily. 06/02/20   [provider]  isosorbide mononitrate (IMDUR) 30 MG 24 hr tablet Take 1 tablet (30 mg total) by mouth daily. 07/15/19 06/06/29  Tobb, Kardie, DO  metoprolol tartrate (LOPRESSOR) 100 MG tablet Take 100 mg by mouth 2 (two) times daily.  [provider]  multivitamin (RENA-VIT) TABS tablet Take 1 tablet by mouth daily. 01/12/18   [provider]  sevelamer carbonate (RENVELA) 800 MG tablet Take 800 mg by mouth daily. 11/07/19   [provider]    Allergies    Patient has no known allergies.  Review of Systems   Review of Systems  Constitutional: Negative for chills and fever.  HENT: Negative.   Respiratory: Positive for shortness of breath. Negative for cough.   Cardiovascular: Positive for leg swelling. Negative for chest pain.  Gastrointestinal: Negative for abdominal pain, nausea and vomiting.  Genitourinary: Negative for dysuria and frequency.  Musculoskeletal: Negative for arthralgias and myalgias.  Neurological: Negative for dizziness, syncope and light-headedness.  All other systems reviewed and are negative.   Physical Exam Updated Vital Signs BP (!) 154/80 (BP Location: Right Arm)   Pulse 74   Temp 98.2 F (36.8 C) (Oral)   Resp 18   Ht 5\' 8"  (1.727 m)   Wt 59 kg   SpO2 90%   BMI 19.77 kg/m   Physical Exam Vitals and nursing note reviewed.  Constitutional:      General: He is not in acute distress.    Appearance: He is  well-developed. He is not diaphoretic.     Comments: Chronically ill-appearing but in no acute distress  HENT:     Head: Normocephalic and atraumatic.     Mouth/Throat:     Mouth: Mucous membranes are moist.     Pharynx: Oropharynx is clear.  Eyes:     General:        Right eye: No discharge.        Left eye: No discharge.     Pupils: Pupils are equal, round, and reactive to light.  Cardiovascular:     Rate and Rhythm: Normal rate and regular rhythm.     Heart sounds: Normal heart sounds.  Pulmonary:     Effort: Pulmonary effort is normal. No respiratory distress.     Breath sounds: Rales present. No wheezing.     Comments: On chronic 2 L nasal cannula, respirations equal and unlabored, sats appropriate, on exam he does have some rales in bilateral bases, but lungs otherwise clear with normal air movement Chest:     Chest wall: No tenderness.  Abdominal:     General: Bowel sounds are normal. There is no distension.     Palpations: Abdomen is soft. There is no mass.     Tenderness: There is no abdominal tenderness. There is no guarding.  Musculoskeletal:        General: No deformity.     Cervical back: Neck supple.     Right lower leg: Edema present.     Left lower leg: Edema present.     Comments: Mild edema over bilateral ankles  Skin:    General: Skin is warm and dry.     Capillary Refill: Capillary refill takes less than 2 seconds.  Neurological:     Mental Status: He is alert.     Coordination: Coordination normal.     Comments: Speech is clear, able to follow commands Moves extremities without ataxia, coordination intact  Psychiatric:        Mood and Affect: Mood normal.        Behavior: Behavior normal.     ED Results / Procedures / Treatments   Labs (all labs ordered are listed, but only abnormal results are displayed) Labs Reviewed  BASIC METABOLIC PANEL - Abnormal; Notable  for the following components:      Result Value   Potassium 5.6 (*)    CO2 19 (*)      BUN 80 (*)    Creatinine, Ser 10.70 (*)    GFR, Estimated 5 (*)    Anion gap 19 (*)    All other components within normal limits  CBC - Abnormal; Notable for the following components:   RBC 2.44 (*)    Hemoglobin 8.6 (*)    HCT 26.4 (*)    MCV 108.2 (*)    MCH 35.2 (*)    Platelets 146 (*)    All other components within normal limits  I-STAT CHEM 8, ED - Abnormal; Notable for the following components:   Potassium 5.6 (*)    BUN 89 (*)    Creatinine, Ser 11.90 (*)    Hemoglobin 7.1 (*)    HCT 21.0 (*)    All other components within normal limits  TROPONIN I (HIGH SENSITIVITY) - Abnormal; Notable for the following components:   Troponin I (High Sensitivity) 56 (*)    All other components within normal limits  RESPIRATORY PANEL BY RT PCR (FLU A&B, COVID)    EKG None  Radiology DG Chest 2 View  Result Date: 06/11/2020 CLINICAL DATA:  Chest pain.  Cough. EXAM: CHEST - 2 VIEW COMPARISON:  06/06/2020 FINDINGS: The cardiac silhouette remains mildly enlarged. The lungs are well inflated with a similar appearance of pulmonary vascular congestion and mild bilateral interstitial opacities. There is a persistent small left pleural effusion. Prior ACDF is noted. IMPRESSION: Unchanged mild pulmonary edema and small left pleural effusion. Electronically Signed   By: Logan Bores M.D.   On: 06/11/2020 06:20    Procedures Procedures (including critical care time)  Medications Ordered in ED Medications  Chlorhexidine Gluconate Cloth 2 % PADS 6 each (has no administration in time range)    ED Course  I have reviewed the triage vital signs and the nursing notes.  Pertinent labs & imaging results that were available during my care of the patient were reviewed by me and considered in my medical decision making (see chart for details).    MDM Rules/Calculators/A&P                         60 year old male who does not currently have a dialysis center presents today with worsening  shortness of breath requesting dialysis, he was last dialyzed 5 days ago on 10/6, has slightly worsening shortness of breath, on his home 2 L nasal cannula, some mild ankle edema bilaterally.  EKG does have some peaked T waves, but i-STAT lab work shows only mild hyperkalemia 5.6.  Chest x-ray with unchanged mild pulmonary edema and pleural effusion.  Will consult nephrology for dialysis.  Case discussed with Dr. Justin Mend with nephrology who will arrange for patient to be dialyzed today.  Covid swab ordered.  Patient remains stable waiting for dialysis.  Final Clinical Impression(s) / ED Diagnoses Final diagnoses:  ESRD on hemodialysis ([place])  Hyperkalemia    Rx / DC Orders ED Discharge Orders    None       Jacqlyn Larsen, [place] 06/11/20 1005    Veryl Speak, MD 06/11/20 1137

## 2020-06-11 NOTE — ED Triage Notes (Signed)
Pt reports he needs his regular dialysis. Does not have a permanent center so here for emergent dialysis. VSS. NAD at present.

## 2020-06-11 NOTE — Progress Notes (Signed)
Pt signed off the Hd tx with 1.25hrs left; Early Termination Form given and signed. Pt left the unit around 1630 stating that he had somewhere to go. [place], [facility] to get guidance on how to proceed. Pt was discharged from HD by  [facility], pt left the unit with all his belongings.

## 2020-06-11 NOTE — Progress Notes (Signed)
Received call from HD unit that patient left without being discharged. Dialysis session ended early. RN attempted to give report to the ED receiving nurse. Patient did not want to wait to return to the ED for discharge papework. Patient will be discharged from the computer.  RN stated that personal belongings were with the patient when he left.

## 2020-06-11 NOTE — Social Work (Signed)
EDCSW contacted Nephrology CSW to update that Pt is in ED. Due to behaviors, Pt is unable to receive HD at previous outpatient clinic and remains without a clinic at this time.

## 2020-06-12 ENCOUNTER — Ambulatory Visit: Payer: Medicare Other

## 2020-06-12 DIAGNOSIS — M7918 Myalgia, other site: Secondary | ICD-10-CM

## 2020-06-12 HISTORY — DX: Myalgia, other site: M79.18

## 2020-06-15 ENCOUNTER — Other Ambulatory Visit: Payer: Self-pay

## 2020-06-15 ENCOUNTER — Emergency Department (HOSPITAL_COMMUNITY)
Admission: EM | Admit: 2020-06-15 | Discharge: 2020-06-15 | Discharge status: Against Medical Advice | Payer: Medicare Other | Attending: Emergency Medicine | Admitting: Emergency Medicine

## 2020-06-15 ENCOUNTER — Emergency Department (HOSPITAL_COMMUNITY): Payer: Medicare Other

## 2020-06-15 DIAGNOSIS — I5033 Acute on chronic diastolic (congestive) heart failure: Secondary | ICD-10-CM | POA: Diagnosis not present

## 2020-06-15 DIAGNOSIS — J45901 Unspecified asthma with (acute) exacerbation: Secondary | ICD-10-CM | POA: Diagnosis not present

## 2020-06-15 DIAGNOSIS — N186 End stage renal disease: Secondary | ICD-10-CM | POA: Insufficient documentation

## 2020-06-15 DIAGNOSIS — R0602 Shortness of breath: Secondary | ICD-10-CM | POA: Diagnosis present

## 2020-06-15 DIAGNOSIS — Z992 Dependence on renal dialysis: Secondary | ICD-10-CM | POA: Insufficient documentation

## 2020-06-15 DIAGNOSIS — Z79899 Other long term (current) drug therapy: Secondary | ICD-10-CM | POA: Diagnosis not present

## 2020-06-15 DIAGNOSIS — I132 Hypertensive heart and chronic kidney disease with heart failure and with stage 5 chronic kidney disease, or end stage renal disease: Secondary | ICD-10-CM | POA: Diagnosis not present

## 2020-06-15 LAB — COMPREHENSIVE METABOLIC PANEL
ALT: 16 U/L (ref 0–44)
AST: 14 U/L — ABNORMAL LOW (ref 15–41)
Albumin: 3.8 g/dL (ref 3.5–5.0)
Alkaline Phosphatase: 71 U/L (ref 38–126)
Anion gap: 19 — ABNORMAL HIGH (ref 5–15)
BUN: 80 mg/dL — ABNORMAL HIGH (ref 8–23)
CO2: 19 mmol/L — ABNORMAL LOW (ref 22–32)
Calcium: 9.5 mg/dL (ref 8.9–10.3)
Chloride: 101 mmol/L (ref 98–111)
Creatinine, Ser: 10.84 mg/dL — ABNORMAL HIGH (ref 0.61–1.24)
GFR, Estimated: 5 mL/min — ABNORMAL LOW (ref >60)
Glucose, Bld: 105 mg/dL — ABNORMAL HIGH (ref 70–99)
Potassium: 5.1 mmol/L (ref 3.5–5.1)
Sodium: 139 mmol/L (ref 135–145)
Total Bilirubin: 0.8 mg/dL (ref 0.3–1.2)
Total Protein: 6.5 g/dL (ref 6.5–8.1)

## 2020-06-15 LAB — CBC WITH DIFFERENTIAL/PLATELET
Abs Immature Granulocytes: 0.02 10*3/uL (ref 0.00–0.07)
Basophils Absolute: 0.1 10*3/uL (ref 0.0–0.1)
Basophils Relative: 1 %
Eosinophils Absolute: 0.3 10*3/uL (ref 0.0–0.5)
Eosinophils Relative: 5 %
HCT: 24.1 % — ABNORMAL LOW (ref 39.0–52.0)
Hemoglobin: 7.6 g/dL — ABNORMAL LOW (ref 13.0–17.0)
Immature Granulocytes: 0 %
Lymphocytes Relative: 9 %
Lymphs Abs: 0.5 10*3/uL — ABNORMAL LOW (ref 0.7–4.0)
MCH: 34.5 pg — ABNORMAL HIGH (ref 26.0–34.0)
MCHC: 31.5 g/dL (ref 30.0–36.0)
MCV: 109.5 fL — ABNORMAL HIGH (ref 80.0–100.0)
Monocytes Absolute: 0.4 10*3/uL (ref 0.1–1.0)
Monocytes Relative: 8 %
Neutro Abs: 4.2 10*3/uL (ref 1.7–7.7)
Neutrophils Relative %: 77 %
Platelets: 135 10*3/uL — ABNORMAL LOW (ref 150–400)
RBC: 2.2 MIL/uL — ABNORMAL LOW (ref 4.22–5.81)
RDW: 13.6 % (ref 11.5–15.5)
WBC: 5.5 10*3/uL (ref 4.0–10.5)
nRBC: 0 % (ref 0.0–0.2)

## 2020-06-15 MED ORDER — DARBEPOETIN ALFA 100 MCG/0.5ML IJ SOSY: 100.0000 ug | PREFILLED_SYRINGE | Freq: Once | INTRAMUSCULAR | Status: DC

## 2020-06-15 MED ORDER — CHLORHEXIDINE GLUCONATE CLOTH 2 % EX PADS: 6.0000 | MEDICATED_PAD | Freq: Every day | CUTANEOUS | Status: DC

## 2020-06-15 NOTE — ED Notes (Addendum)
Pt signed out of ED AMA. Risk explained to pt, AMA summary reviewed. Pt not willing to stay.

## 2020-06-15 NOTE — Discharge Instructions (Addendum)
You understand that by leaving [facility] you risk severe disability and death.  Please feel free to return to the ER at any time to refill your dialysis.

## 2020-06-15 NOTE — ED Notes (Signed)
Pt demanding to leave AMA, requesting a wheelchair, provider to bedside

## 2020-06-15 NOTE — ED Provider Notes (Signed)
[facility] EMERGENCY DEPARTMENT Provider Note   CSN: [id number] Arrival date & time: 06/15/20  0547     History Chief Complaint  Patient presents with  . Shortness of Breath    needs Dialysis    Thomas Robinson is a 59 y.o. male.  HPI 59 year old male with ESRD on hemodialysis MWF, asthma, hypertension, pulmonary edema presents to the ER with complaints of shortness of breath.  Patient states that he continues to have issues with dialysis center placement, which his PCP is currently working on.  He was seen here on 10/11 with a same complaint, needing emergent dialysis.  He had some mild hyperkalemia then, and was dialyzed per Dr. Justin Mend with nephrology.  He refers that he feels some "tightness in his chest" that is consistent with him requiring dialysis.  He notes some mild right lower leg swelling, however no redness.  He denies any chest pain, fevers, cough, weakness, nasal congestion, or any other infectious symptoms.  He denies any recent travel or surgeries.  No prior history of PE.    Past Medical History:  Diagnosis Date  . Acute kidney injury superimposed on CKD ([place]) 05/24/2017  . Acute on chronic diastolic CHF (congestive heart failure) ([place]) 04/13/2018  . AKI (acute kidney injury) ([place]) 04/10/2015  . Arthritis   . Asthma   . Asthma, chronic, unspecified asthma severity, with acute exacerbation 10/23/2017  . Atypical chest pain 02/16/2018  . CAP (community acquired pneumonia) 10/23/2017  . Cataract    right - removed by surgery  . Cerebral infarction due to thrombosis of cerebral artery ([place])   . Cerebrovascular accident (CVA) ([place])   . Chronic kidney disease   . CKD (chronic kidney disease), stage V ([place]) 05/24/2017  . Cough    chronic cough  . Dyspnea    "all the time" per patient at PAT 12/30/19  RA sats 87%, no oxygen use  . ESRD (end stage renal disease) on dialysis ([place]) 02/16/2018   Dialysis  T Thus Sat  - [facility]   . Essential  hypertension 04/09/2015  . GERD (gastroesophageal reflux disease) 06/04/2019  . GI bleeding 05/24/2017  . History of completed stroke 2018  . Hyperlipidemia   . Hypertension   . Hypertensive urgency 05/24/2017  . Kidney failure   . Oxygen deficiency 12/30/2019   O2 sats on RA 87% at PAT appt   . Respiratory failure, acute ([employer]) 10/23/2017  . Restless leg syndrome   . Rib fractures 06/2019   Right  . Stroke ([place]) 2018  . Symptomatic anemia 05/24/2017  . Thoracic ascending aortic aneurysm ([place])    4.5 cm 06/04/19 CT    Patient Active Problem List   Diagnosis Date Noted  . Non-compliance with renal dialysis ([place]) 02/08/2020  . Acute pulmonary edema ([place]) 01/23/2020  . Cervical disc herniation 01/04/2020  . Tachycardia 01/04/2020  . SOB (shortness of breath)   . Rib fractures 06/05/2019  . Fall at home, initial encounter 06/04/2019  . Right rib fracture 06/04/2019  . Lung contusion 06/04/2019  . Acute respiratory failure with hypoxia ([place]) 06/04/2019  . Anemia in ESRD (end-stage renal disease) ([place]) 06/04/2019  . GERD (gastroesophageal reflux disease) 06/04/2019  . Chronic diastolic (congestive) heart failure ([place]) 06/04/2019  . Fall 06/04/2019  . Thoracic ascending aortic aneurysm ([place]) 06/04/2019  . Acute exacerbation of congestive heart failure ([place]) 05/16/2019  . Chest pain 04/13/2018  . Acute on chronic diastolic CHF (congestive heart failure) ([place]) 04/13/2018  .  Chest pressure   . Atypical chest pain 02/16/2018  . ESRD on dialysis ([place]) 02/16/2018  . Volume overload 10/23/2017  . Cough 08/23/2017  . Constipation   . Macrocytic anemia 05/24/2017  . Hypertensive urgency 05/24/2017  . History of completed stroke   . Essential hypertension 04/09/2015    Past Surgical History:  Procedure Laterality Date  . ANTERIOR CERVICAL DECOMP/DISCECTOMY FUSION N/A 01/04/2020   Procedure: ANTERIOR CERVICAL DECOMPRESSION/DISCECTOMY FUSION CERVICAL FIVE THROUGH SEVEN;  Surgeon: Melina Schools, MD;  Location: [facility];  Service: Orthopedics;  Laterality: N/A;  3 hrs  . AV FISTULA PLACEMENT Left 05/28/2017   Procedure: LEFT ARM ARTERIOVENOUS (AV) FISTULA CREATION;  Surgeon: Conrad Nowata, MD;  Location: [facility];  Service: Vascular;  Laterality: Left;  . EYE SURGERY Right 06/02/2019   Cataract removed  . FRACTURE SURGERY    . IR FLUORO GUIDE CV LINE RIGHT  05/25/2017  . IR US GUIDE VASC ACCESS RIGHT  05/25/2017  . REVISON OF ARTERIOVENOUS FISTULA Left 07/18/2019   Procedure: REVISION PLICATION OF RADIOCEPHALIC ARTERIOVENOUS FISTULA LEFT ARM;  Surgeon: Angelia Mould, MD;  Location: [facility];  Service: Vascular;  Laterality: Left;  . RINOPLASTY         Family History  Problem Relation Age of Onset  . Cancer Mother     Social History   Tobacco Use  . Smoking status: Never Smoker  . Smokeless tobacco: Never Used  Vaping Use  . Vaping Use: Never used  Substance Use Topics  . Alcohol use: Not Currently    Alcohol/week: 0.0 standard drinks    Comment: has a drink once in a while- 12/30/19  . Drug use: No    Home Medications Prior to Admission medications   Medication Sig Start Date End Date Taking? Authorizing Provider  albuterol (VENTOLIN HFA) 108 (90 Base) MCG/ACT inhaler Inhale 1-2 puffs into the lungs every 4 (four) hours as needed for wheezing.  05/23/20   [provider]  amLODipine (NORVASC) 10 MG tablet Take 10 mg by mouth daily.     [provider]  atorvastatin (LIPITOR) 20 MG tablet Take 20 mg by mouth daily. 04/19/20   [provider]  AURYXIA 1 GM 210 MG(Fe) tablet Take 420 mg by mouth 3 (three) times daily. 03/16/20   [provider]  carvedilol (COREG) 12.5 MG tablet Take 1 tablet (12.5 mg total) by mouth 2 (two) times daily with a meal. Patient not taking: Reported on 05/31/2020 05/18/19   Martyn Malay, MD  cinacalcet (SENSIPAR) 60 MG tablet Take 60 mg by mouth daily. 11/11/18   [provider]  furosemide  (LASIX) 40 MG tablet Take 40 mg of Lasix on non-dialysis days. Patient taking differently: Take 40 mg by mouth See admin instructions. On Sunday, Monday, Wednesday, Thursday and Saturday 03/21/20   Tobb, Kardie, DO  gabapentin (NEURONTIN) 300 MG capsule Take 300 mg by mouth 2 (two) times daily as needed (pain).     [provider]  hydrALAZINE (APRESOLINE) 100 MG tablet Take 1 tablet (100 mg total) by mouth every 8 (eight) hours. Patient not taking: Reported on 06/06/2020 05/18/19   Martyn Malay, MD  hydrALAZINE (APRESOLINE) 25 MG tablet Take 50 mg by mouth 3 (three) times daily. 06/02/20   [provider]  isosorbide mononitrate (IMDUR) 30 MG 24 hr tablet Take 1 tablet (30 mg total) by mouth daily. 07/15/19 06/06/29  Tobb, Kardie, DO  metoprolol tartrate (LOPRESSOR) 100 MG tablet Take 100  mg by mouth 2 (two) times daily.     [provider]  multivitamin (RENA-VIT) TABS tablet Take 1 tablet by mouth daily. 01/12/18   [provider]  sevelamer carbonate (RENVELA) 800 MG tablet Take 800 mg by mouth daily. 11/07/19   [provider]    Allergies    Patient has no known allergies.  Review of Systems   Review of Systems  Constitutional: Negative for chills and fever.  HENT: Negative for ear pain and sore throat.   Eyes: Negative for pain and visual disturbance.  Respiratory: Positive for shortness of breath. Negative for cough.   Cardiovascular: Positive for leg swelling. Negative for chest pain and palpitations.  Gastrointestinal: Negative for abdominal pain and vomiting.  Genitourinary: Negative for dysuria and hematuria.  Musculoskeletal: Negative for arthralgias and back pain.  Skin: Negative for color change and rash.  Neurological: Negative for seizures and syncope.  All other systems reviewed and are negative.   Physical Exam Updated Vital Signs BP (!) 152/79   Pulse 60   Temp 98 F (36.7 C) (Oral)   Resp 20   SpO2 95%   Physical  Exam Vitals and nursing note reviewed.  Constitutional:      General: He is not in acute distress.    Appearance: He is well-developed. He is not ill-appearing or diaphoretic.  HENT:     Head: Normocephalic and atraumatic.  Eyes:     Conjunctiva/sclera: Conjunctivae normal.  Neck:     Vascular: No JVD.  Cardiovascular:     Rate and Rhythm: Normal rate and regular rhythm.     Heart sounds: No murmur heard.   Pulmonary:     Effort: Pulmonary effort is normal. No respiratory distress.     Breath sounds: Normal breath sounds. No decreased breath sounds, wheezing, rhonchi or rales.     Comments: Respirations equal and unlabored, patient able to speak in full sentences, lungs clear to auscultation bilaterally  Chest:     Chest wall: No tenderness.  Abdominal:     Palpations: Abdomen is soft.     Tenderness: There is no abdominal tenderness.  Musculoskeletal:     Cervical back: Normal range of motion and neck supple.     Right lower leg: Edema present.     Left lower leg: No tenderness. No edema.     Comments: 1+ nonpitting edema to the right lower ankle.  No evidence of erythema, no calf tenderness  Skin:    General: Skin is warm and dry.     Findings: No erythema.  Neurological:     General: No focal deficit present.     Mental Status: He is alert and oriented to person, place, and time.  Psychiatric:        Mood and Affect: Mood normal.        Behavior: Behavior normal.     ED Results / Procedures / Treatments   Labs (all labs ordered are listed, but only abnormal results are displayed) Labs Reviewed  CBC WITH DIFFERENTIAL/PLATELET - Abnormal; Notable for the following components:      Result Value   RBC 2.20 (*)    Hemoglobin 7.6 (*)    HCT 24.1 (*)    MCV 109.5 (*)    MCH 34.5 (*)    Platelets 135 (*)    Lymphs Abs 0.5 (*)    All other components within normal limits  COMPREHENSIVE METABOLIC PANEL - Abnormal; Notable for the following components:   CO2  19 (*)     Glucose, Bld 105 (*)    BUN 80 (*)    Creatinine, Ser 10.84 (*)    AST 14 (*)    GFR, Estimated 5 (*)    Anion gap 19 (*)    All other components within normal limits    EKG EKG Interpretation  Date/Time:  Friday June 15 2020 05:54:46 EDT Ventricular Rate:  84 PR Interval:  204 QRS Duration: 98 QT Interval:  382 QTC Calculation: 451 R Axis:   79 Text Interpretation: Normal sinus rhythm Possible Left atrial enlargement Left ventricular hypertrophy ( Sokolow-Lyon , Cornell product ) Abnormal ECG No significant change since last tracing Confirmed by Ripley Fraise [phone]) on 06/15/2020 6:00:34 AM   Radiology DG Chest Portable 1 View  Result Date: 06/15/2020 CLINICAL DATA:  Shortness of breath. End-stage renal disease. Need for emergent dialysis. EXAM: PORTABLE CHEST 1 VIEW COMPARISON:  Two-view chest x-ray 06/11/2020 FINDINGS: Heart is enlarged. Atherosclerotic changes are noted at the aortic arch. Patchy airspace opacities are now present at the lung bases, right greater than left. Moderate vascular congestion is noted. IMPRESSION: 1. Cardiomegaly and pulmonary vascular congestion are compatible with congestive heart failure. 2. Patchy airspace disease at the lung bases, right greater than left. This may represent atelectasis, infection is not excluded. Electronically Signed   By: San Morelle M.D.   On: 06/15/2020 07:06    Procedures Procedures (including critical care time)  Medications Ordered in ED Medications  Chlorhexidine Gluconate Cloth 2 % PADS 6 each (has no administration in time range)  Darbepoetin Alfa (ARANESP) injection 100 mcg (has no administration in time range)    ED Course  I have reviewed the triage vital signs and the nursing notes.  Pertinent labs & imaging results that were available during my care of the patient were reviewed by me and considered in my medical decision making (see chart for details).    MDM Rules/Calculators/A&P                          32:47 AM: 59 year old male requiring dialysis if he does not have an established outpatient dialysis center On presentation, he is alert, oriented, nontoxic-appearing, no acute distress, resting comfortably in the ER bed.  On arrival, the patient was found to be hypoxic with a O2 sat of 85%, however he was not wearing his normal home 2 L of oxygen.  Patient is baseline on 2 L, current sats at 99%.  Per chart review, has a history of chronic hypoxia.  Other vitals reassuring.  Physical exam with clear lung sounds, does have some 1+ pitting edema to his right lower extremity, but no evidence of fluid overload.  Plan to check labs, consult nephro.  8:05 AM: Spoke with Dr. Justin Mend with nephrology, he will arrange dialysis for the patient.  His CMP is without significant electrode abnormalities, creatinine consistent with ESRD.  He does have a mildly elevated anion gap of 19.  His CBC shows no leukocytosis, he does have a hemoglobin of 7.6 which appears to be stable.  Platelet count of 135, this appears to be decreasing slightly but stable.  His chest x-ray is not suggestive of fluid overload, reading cannot exclude infection, however patient denies any cough, fevers, chills or any other infectious symptoms.  His Covid test from Monday was negative.  1:22 PM: Called to bedside by nursing staff, patient is insistent on leaving [facility] as he states he  has "things to do".  Nursing staff reached out to the inpatient dialysis, and he was given an approximate time of sometime this afternoon.  I discussed the risks with the patient, including severe disability and death.  He understands that by not getting his scheduled dialysis, his condition will continue to get worse.  He also understands that the next time he comes back to the ED ER his wait times may be longer.  He still insists that he would like to leave.  Patient's vitals were stable at discharge.    Final Clinical Impression(s) /  ED Diagnoses Final diagnoses:  End stage renal disease on dialysis [facility])    Rx / DC Orders ED Discharge Orders    None       Lyndel Safe 06/15/20 [place], MD 06/18/20 1318

## 2020-06-15 NOTE — ED Triage Notes (Signed)
Pt has not got an permanent dialysis place and pt is here again today to have emergent dialysis. Pt was here on the 11th for his treatment per chart, pt is SOB and says he feels he needs to have dialysis. Pt sats were 85% in triage, Pt was just placed on O2 at home this week/ Pt said he is suppose to have dialysis MON, WED, Friday.

## 2020-06-18 ENCOUNTER — Ambulatory Visit: Payer: Medicare Other

## 2020-06-18 DIAGNOSIS — I110 Hypertensive heart disease with heart failure: Secondary | ICD-10-CM

## 2020-06-18 DIAGNOSIS — I248 Other forms of acute ischemic heart disease: Secondary | ICD-10-CM | POA: Insufficient documentation

## 2020-06-18 HISTORY — DX: Other forms of acute ischemic heart disease: I24.8

## 2020-06-18 HISTORY — DX: Hypertensive heart disease with heart failure: I11.0

## 2020-06-20 ENCOUNTER — Ambulatory Visit: Payer: Medicare Other

## 2020-06-22 DIAGNOSIS — I5189 Other ill-defined heart diseases: Secondary | ICD-10-CM | POA: Insufficient documentation

## 2020-06-22 DIAGNOSIS — I150 Renovascular hypertension: Secondary | ICD-10-CM

## 2020-06-22 HISTORY — DX: Other ill-defined heart diseases: I51.89

## 2020-06-22 HISTORY — DX: Renovascular hypertension: I15.0

## 2020-06-25 ENCOUNTER — Ambulatory Visit: Payer: Medicare Other

## 2020-06-25 ENCOUNTER — Other Ambulatory Visit: Payer: Self-pay

## 2020-06-26 DIAGNOSIS — M48061 Spinal stenosis, lumbar region without neurogenic claudication: Secondary | ICD-10-CM | POA: Insufficient documentation

## 2020-06-27 ENCOUNTER — Ambulatory Visit (INDEPENDENT_AMBULATORY_CARE_PROVIDER_SITE_OTHER): Payer: Medicare Other | Admitting: Cardiology

## 2020-06-27 ENCOUNTER — Other Ambulatory Visit: Payer: Self-pay

## 2020-06-27 ENCOUNTER — Encounter: Payer: Self-pay | Admitting: Cardiology

## 2020-06-27 VITALS — BP 122/58 | HR 78 | Ht 68.0 in | Wt 141.0 lb

## 2020-06-27 DIAGNOSIS — E7849 Other hyperlipidemia: Secondary | ICD-10-CM | POA: Diagnosis not present

## 2020-06-27 DIAGNOSIS — I7121 Aneurysm of the ascending aorta, without rupture: Secondary | ICD-10-CM

## 2020-06-27 DIAGNOSIS — I5032 Chronic diastolic (congestive) heart failure: Secondary | ICD-10-CM | POA: Diagnosis not present

## 2020-06-27 DIAGNOSIS — N186 End stage renal disease: Secondary | ICD-10-CM

## 2020-06-27 DIAGNOSIS — Z992 Dependence on renal dialysis: Secondary | ICD-10-CM

## 2020-06-27 DIAGNOSIS — I712 Thoracic aortic aneurysm, without rupture: Secondary | ICD-10-CM | POA: Diagnosis not present

## 2020-06-27 NOTE — Patient Instructions (Signed)
Medication Instructions:  Your physician recommends that you continue on your current medications as directed. Please refer to the Current Medication list given to you today.  *If you need a refill on your cardiac medications before your next appointment, please call your pharmacy*   Lab Work: None If you have labs (blood work) drawn today and your tests are completely normal, you will receive your results only by: [name] MyChart Message (if you have MyChart) OR . A paper copy in the mail If you have any lab test that is abnormal or we need to change your treatment, we will call you to review the results.   Testing/Procedures: None   Follow-Up: At [facility], you and your health needs are our priority.  As part of our continuing mission to provide you with exceptional heart care, we have created designated Provider Care Teams.  These Care Teams include your primary Cardiologist (physician) and Advanced Practice Providers (APPs -  Physician Assistants and Nurse Practitioners) who all work together to provide you with the care you need, when you need it.  We recommend signing up for the patient portal called "MyChart".  Sign up information is provided on this After Visit Summary.  MyChart is used to connect with patients for Virtual Visits (Telemedicine).  Patients are able to view lab/test results, encounter notes, upcoming appointments, etc.  Non-urgent messages can be sent to your provider as well.   To learn more about what you can do with MyChart, go to [email or web address].    Your next appointment:   Please go to the emergency room to be evaluated  The format for your next appointment:   In Person  Provider:   [name], DO   Other Instructions You need emergent hemodialysis. At this time you have refused to be escorted to the ED for labs and admission for hemodialysis.

## 2020-06-27 NOTE — Progress Notes (Signed)
Cardiology Office Note:    Date:  06/27/2020   ID:  [name], DOB 02/25/1961, MRN [id number]  PCP:  [name], PA  Cardiologist:  [name], DO  Electrophysiologist:  None   Referring MD: Aretta Nip, MD   " They wanted me to see you because I got in a fight with the tech at the hemodialysis center and that was banned"  History of Present Illness:    Thomas Robinson a 59 y.o.malewith a hx of ESRD on hemodialysis(Tuesdays, Thursday, Saturday)hypertension, asthma, CVA,coronary artery disease,diastolic heart failure with recent ejection fraction reported as greater than 65%, anemia chronic disease, proximal ascending aorta dilatation measuring 4.5 cm in October 2020.  I last saw the patient in July 2021 during that time because he makes urine I put the patient on Lasix on nondialysis day.  I emphasized with the patient again the significance of setting up an appointment for nephrology as I had referred him given his kidney disease.  He was not compliant with his hemodialysis I advised the patient that this could be detrimental.  In the interim he had his cardiac CT done which showed 4.5 cm and was same compared to his CT which was done in October 2020.  He is here today for follow-up visit.  He told me he was referred to see me from the ED because he had missed his hemodialysis.  He tells me he was banned from the hemodialysis center given the fact that he had a conflict with the tech and he has not had dialysis since last week.   Past Medical History:  Diagnosis Date  . Acute exacerbation of congestive heart failure ([place]) 05/16/2019  . Acute kidney injury superimposed on CKD ([place]) 05/24/2017  . Acute on chronic diastolic CHF (congestive heart failure) ([place]) 04/13/2018  . Acute pulmonary edema ([place]) 01/23/2020  . Acute respiratory failure with hypoxia ([place]) 06/04/2019  . AKI (acute kidney injury) ([place]) 04/10/2015  . Anaphylactic shock, unspecified, sequela  06/10/2019  . Arthritis   . Asthma   . Asthma, chronic, unspecified asthma severity, with acute exacerbation 10/23/2017  . Atypical chest pain 02/16/2018  . CAP (community acquired pneumonia) 10/23/2017  . Carpal tunnel syndrome of right wrist 10/05/2018  . Cataract    right - removed by surgery  . Cerebral infarction due to thrombosis of cerebral artery ([place])   . Cerebrovascular accident (CVA) ([place])   . Cervical disc herniation 01/04/2020  . Chest discomfort   . Chest pain 04/13/2018  . Chronic diastolic (congestive) heart failure ([place]) 06/04/2019  . Chronic diastolic heart failure ([place]) 04/13/2018  . Chronic kidney disease   . Chronic low back pain 11/24/2019  . CKD (chronic kidney disease), stage V ([place]) 05/24/2017  . Constipation   . Cough    chronic cough  . Demand ischemia ([place]) 06/18/2020  . Diastolic dysfunction 75/17/0017  . Dyspnea    "all the time" per patient at PAT 12/30/19  RA sats 87%, no oxygen use  . Encounter for immunization 07/08/2017  . Encounter for removal of sutures 03/29/2020  . ESRD (end stage renal disease) on dialysis ([place]) 02/16/2018   Dialysis  T Thus Sat  - [facility]   . Essential hypertension 04/09/2015  . Fall 06/04/2019  . Fall at home, initial encounter 06/04/2019  . GERD (gastroesophageal reflux disease) 06/04/2019  . GI bleeding 05/24/2017  . Gram-negative sepsis, unspecified ([place]) 09/05/2017  . History of completed stroke 2018  . History of  fusion of cervical spine 03/26/2020  . Hyperlipidemia   . Hypertension   . Hypertensive urgency 05/24/2017  . Hypokalemia 06/04/2017  . Iron deficiency anemia, unspecified 09/09/2017  . Kidney failure   . Left shoulder pain 11/11/2019  . Lumbar radiculopathy 11/24/2019  . Lung contusion 06/04/2019  . LVH (left ventricular hypertrophy) due to hypertensive disease, with heart failure ([place]) 06/18/2020  . Macrocytic anemia 05/24/2017  . Moderate protein-calorie malnutrition ([place]) 06/06/2017  . Myofascial pain syndrome  06/12/2020  . Neuritis of right ulnar nerve 09/13/2018  . Non-compliance with renal dialysis ([place]) 02/08/2020  . Other hyperlipidemia 06/04/2017  . Oxygen deficiency 12/30/2019   O2 sats on RA 87% at PAT appt   . Pain in joint of right elbow 09/13/2018  . Renovascular hypertension 06/22/2020  . Respiratory failure, acute ([place]) 10/23/2017  . Restless leg syndrome   . Rib fractures 06/2019   Right  . Right rib fracture 06/04/2019  . Secondary hyperparathyroidism of renal origin ([place]) 06/04/2017  . SOB (shortness of breath)   . Stroke ([place]) 2018  . Symptomatic anemia 05/24/2017  . Tachycardia 01/04/2020  . Thoracic ascending aortic aneurysm ([facility])    4.5 cm 06/04/19 CT  . Ulnar neuropathy 10/05/2018  . Volume overload 10/23/2017    Past Surgical History:  Procedure Laterality Date  . ANTERIOR CERVICAL DECOMP/DISCECTOMY FUSION N/A 01/04/2020   Procedure: ANTERIOR CERVICAL DECOMPRESSION/DISCECTOMY FUSION CERVICAL FIVE THROUGH SEVEN;  Surgeon: Melina Schools, MD;  Location: [facility];  Service: Orthopedics;  Laterality: N/A;  3 hrs  . AV FISTULA PLACEMENT Left 05/28/2017   Procedure: LEFT ARM ARTERIOVENOUS (AV) FISTULA CREATION;  Surgeon: Conrad Billingsley, MD;  Location: [facility];  Service: Vascular;  Laterality: Left;  . EYE SURGERY Right 06/02/2019   Cataract removed  . FRACTURE SURGERY    . IR FLUORO GUIDE CV LINE RIGHT  05/25/2017  . IR US GUIDE VASC ACCESS RIGHT  05/25/2017  . REVISON OF ARTERIOVENOUS FISTULA Left 07/18/2019   Procedure: REVISION PLICATION OF RADIOCEPHALIC ARTERIOVENOUS FISTULA LEFT ARM;  Surgeon: Angelia Mould, MD;  Location: [facility];  Service: Vascular;  Laterality: Left;  . RINOPLASTY      Current Medications: Current Meds  Medication Sig  . albuterol (VENTOLIN HFA) 108 (90 Base) MCG/ACT inhaler Inhale 2 puffs into the lungs every 6 (six) hours as needed.  [name] amLODipine (NORVASC) 10 MG tablet Take 10 mg by mouth daily.   [name] atorvastatin (LIPITOR) 20 MG tablet Take 20 mg by mouth  daily.  Lorin Picket 1 GM 210 MG(Fe) tablet Take 420 mg by mouth 3 (three) times daily.  . carvedilol (COREG) 12.5 MG tablet Take 1 tablet (12.5 mg total) by mouth 2 (two) times daily with a meal.  . cinacalcet (SENSIPAR) 60 MG tablet Take 60 mg by mouth daily.  . furosemide (LASIX) 40 MG tablet Take 40 mg of Lasix on non-dialysis days. (Patient taking differently: Take 40 mg by mouth See admin instructions. On Sunday, Monday, Wednesday, Thursday and Saturday)  . gabapentin (NEURONTIN) 300 MG capsule Take 300 mg by mouth 2 (two) times daily as needed (pain).   . hydrALAZINE (APRESOLINE) 100 MG tablet Take 1 tablet (100 mg total) by mouth every 8 (eight) hours.  . hydrALAZINE (APRESOLINE) 25 MG tablet Take 50 mg by mouth 3 (three) times daily.  . isosorbide mononitrate (IMDUR) 30 MG 24 hr tablet Take 1 tablet (30 mg total) by mouth daily.  . metoprolol tartrate (LOPRESSOR) 100 MG tablet Take 100  mg by mouth 2 (two) times daily.   . multivitamin (RENA-VIT) TABS tablet Take 1 tablet by mouth daily.  . sevelamer carbonate (RENVELA) 800 MG tablet Take 800 mg by mouth daily.     Allergies:   Patient has no known allergies.   Social History   Socioeconomic History  . Marital status: Single    Spouse name: Not on file  . Number of children: Not on file  . Years of education: Not on file  . Highest education level: Not on file  Occupational History  . Not on file  Tobacco Use  . Smoking status: Never Smoker  . Smokeless tobacco: Never Used  Vaping Use  . Vaping Use: Never used  Substance and Sexual Activity  . Alcohol use: Not Currently    Alcohol/week: 0.0 standard drinks    Comment: has a drink once in a while- 12/30/19  . Drug use: No  . Sexual activity: Not Currently  Other Topics Concern  . Not on file  Social History Narrative   Lives with a roommate in a one story home.  Has 1 child.  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  Education: high school.   Social Determinants of Health    Financial Resource Strain:   . Difficulty of Paying Living Expenses: Not on file  Food Insecurity:   . Worried About Charity fundraiser in the Last Year: Not on file  . Ran Out of Food in the Last Year: Not on file  Transportation Needs:   . Lack of Transportation (Medical): Not on file  . Lack of Transportation (Non-Medical): Not on file  Physical Activity:   . Days of Exercise per Week: Not on file  . Minutes of Exercise per Session: Not on file  Stress:   . Feeling of Stress : Not on file  Social Connections:   . Frequency of Communication with Friends and Family: Not on file  . Frequency of Social Gatherings with Friends and Family: Not on file  . Attends Religious Services: Not on file  . Active Member of Clubs or Organizations: Not on file  . Attends Archivist Meetings: Not on file  . Marital Status: Not on file     Family History: The patient's family history includes Cancer in his mother.  ROS:   Review of Systems  Constitution: Negative for decreased appetite, fever and weight gain.  HENT: Negative for congestion, ear discharge, hoarse voice and sore throat.   Eyes: Negative for discharge, redness, vision loss in right eye and visual halos.  Cardiovascular: Negative for chest pain, dyspnea on exertion, leg swelling, orthopnea and palpitations.  Respiratory: Negative for cough, hemoptysis, shortness of breath and snoring.   Endocrine: Negative for heat intolerance and polyphagia.  Hematologic/Lymphatic: Negative for bleeding problem. Does not bruise/bleed easily.  Skin: Negative for flushing, nail changes, rash and suspicious lesions.  Musculoskeletal: Negative for arthritis, joint pain, muscle cramps, myalgias, neck pain and stiffness.  Gastrointestinal: Negative for abdominal pain, bowel incontinence, diarrhea and excessive appetite.  Genitourinary: Negative for decreased libido, genital sores and incomplete emptying.  Neurological: Negative for brief  paralysis, focal weakness, headaches and loss of balance.  Psychiatric/Behavioral: Negative for altered mental status, depression and suicidal ideas.  Allergic/Immunologic: Negative for HIV exposure and persistent infections.    EKGs/Labs/Other Studies Reviewed:    The following studies were reviewed today:   EKG: None today  CT angio chest IMPRESSION: 1. Fusiform aneurysmal dilation of the tubular  portion of the ascending thoracic aorta with a maximal transverse diameter of 4.5 cm. Previous measurements from 02/08/2020 were limited by significant cardiac motion artifact. Recommend semi-annual imaging followup by CTA or MRA and referral to cardiothoracic surgery if not already obtained. This recommendation follows 2010 ACCF/AHA/AATS/ACR/ASA/SCA/SCAI/SIR/STS/SVM Guidelines for the Diagnosis and Management of Patients With Thoracic Aortic Disease. Circulation. 2010; 121: Y774-J287. Aortic aneurysm NOS (ICD10-I71.9); Aortic Atherosclerosis (ICD10-170.0) 2. Coronary artery calcifications. 3. Mosaic attenuation of the lungs with areas of mild ground-glass attenuation airspace opacity and areas of probable air trapping. This suggests underlying small airways or small vessels disease. 4. Diffuse mild bronchial wall thickening suggests chronic bronchitis. 5. Cardiomegaly with trace interstitial pulmonary edema.  Recent Labs: 02/08/2020: B Natriuretic Peptide 4,270.9 06/15/2020: ALT 16; BUN 80; Creatinine, Ser 10.84; Hemoglobin 7.6; Platelets 135; Potassium 5.1; Sodium 139  Recent Lipid Panel    Component Value Date/Time   CHOL 133 06/10/2019 0944   TRIG 59 06/10/2019 0944   HDL 46 06/10/2019 0944   CHOLHDL 2.9 06/10/2019 0944   CHOLHDL 2.8 02/17/2018 0345   VLDL 14 02/17/2018 0345   LDLCALC 74 06/10/2019 0944    Physical Exam:    VS:  BP (!) 122/58   Pulse 78   Ht 5\' 8"  (1.727 m)   Wt 141 lb (64 kg)   SpO2 98%   BMI 21.44 kg/m     Wt Readings from Last 3 Encounters:    06/27/20 141 lb (64 kg)  06/11/20 132 lb 7.9 oz (60.1 kg)  06/06/20 110 lb 10.7 oz (50.2 kg)     GEN: Well nourished, well developed in no acute distress HEENT: Normal NECK: No JVD; No carotid bruits LYMPHATICS: No lymphadenopathy CARDIAC: S1S2 noted,RRR, no murmurs, rubs, gallops RESPIRATORY:  Clear to auscultation without rales, wheezing or rhonchi  ABDOMEN: Soft, non-tender, non-distended, +bowel sounds, no guarding. EXTREMITIES: No edema, No cyanosis, no clubbing MUSCULOSKELETAL:  No deformity  SKIN: Warm and dry NEUROLOGIC:  Alert and oriented x 3, non-focal PSYCHIATRIC:  Normal affect, good insight  ASSESSMENT:    1. ESRD on dialysis ([place])   2. Chronic diastolic heart failure (HCC)   3. Other hyperlipidemia   4. Thoracic ascending aortic aneurysm (HCC)    PLAN:     The patient is significantly volume overloaded.  He needs hemodialysis.  I have urged the patient to walk him down to the emergency department to get labs and be admitted to get his hemodialysis.  He has refused and tells me he has other important things to do today and he will not be doing this.  Is been at least over 30 minutes trying to talk to the patient to advise him that this could be detrimental and he could have significant electrolyte abnormality which could result in a cardiac arrest but he has declined to go to the emergency room.  I also again encouraged the patient that he needs to see nephrology.  Especially the fact that he has been banned from his dialysis center he will need to be set up with a new dialysis center and I can give him referral for nephrology.  I explained to the patient that his noncompliance is causing significant issues for his health in general.  I also did tell him that the fact that we cannot understand what his electrolytes it based on the fact that he is refusing everything could lead to demise.  He had no questions today about his CT report.  I shared this with him  again.  The patient is in agreement with the above plan. The patient left the office in stable condition.  The patient will follow up in   Medication Adjustments/Labs and Tests Ordered: Current medicines are reviewed at length with the patient today.  Concerns regarding medicines are outlined above.  Orders Placed This Encounter  Procedures  . Ambulatory referral to Nephrology   No orders of the defined types were placed in this encounter.   Patient Instructions  Medication Instructions:  Your physician recommends that you continue on your current medications as directed. Please refer to the Current Medication list given to you today.  *If you need a refill on your cardiac medications before your next appointment, please call your pharmacy*   Lab Work: None If you have labs (blood work) drawn today and your tests are completely normal, you will receive your results only by: [name] MyChart Message (if you have MyChart) OR . A paper copy in the mail If you have any lab test that is abnormal or we need to change your treatment, we will call you to review the results.   Testing/Procedures: None   Follow-Up: At [facility], you and your health needs are our priority.  As part of our continuing mission to provide you with exceptional heart care, we have created designated Provider Care Teams.  These Care Teams include your primary Cardiologist (physician) and Advanced Practice Providers (APPs -  Physician Assistants and Nurse Practitioners) who all work together to provide you with the care you need, when you need it.  We recommend signing up for the patient portal called "MyChart".  Sign up information is provided on this After Visit Summary.  MyChart is used to connect with patients for Virtual Visits (Telemedicine).  Patients are able to view lab/test results, encounter notes, upcoming appointments, etc.  Non-urgent messages can be sent to your provider as well.   To learn more about  what you can do with MyChart, go to [email or web address].    Your next appointment:   Please go to the emergency room to be evaluated  The format for your next appointment:   In Person  Provider:   [name], DO   Other Instructions You need emergent hemodialysis. At this time you have refused to be escorted to the ED for labs and admission for hemodialysis.      Adopting a Healthy Lifestyle.  Know what a healthy weight is for you (roughly BMI <25) and aim to maintain this   Aim for 7+ servings of fruits and vegetables daily   65-80+ fluid ounces of water or unsweet tea for healthy kidneys   Limit to max 1 drink of alcohol per day; avoid smoking/tobacco   Limit animal fats in diet for cholesterol and heart health - choose grass fed whenever available   Avoid highly processed foods, and foods high in saturated/trans fats   Aim for low stress - take time to unwind and care for your mental health   Aim for 150 min of moderate intensity exercise weekly for heart health, and weights twice weekly for bone health   Aim for 7-9 hours of sleep daily   When it comes to diets, agreement about the perfect plan isnt easy to find, even among the experts. Experts at the [place] developed an idea known as the Healthy Eating Plate. Just imagine a plate divided into logical, healthy portions.   The emphasis is on diet quality:   Load up on  vegetables and fruits - one-half of your plate: Aim for color and variety, and remember that potatoes dont count.   Go for whole grains - one-quarter of your plate: Whole wheat, barley, wheat berries, quinoa, oats, brown rice, and foods made with them. If you want pasta, go with whole wheat pasta.   Protein power - one-quarter of your plate: Fish, chicken, beans, and nuts are all healthy, versatile protein sources. Limit red meat.   The diet, however, does go beyond the plate, offering a few other suggestions.   Use  healthy plant oils, such as olive, canola, soy, corn, sunflower and peanut. Check the labels, and avoid partially hydrogenated oil, which have unhealthy trans fats.   If youre thirsty, drink water. Coffee and tea are good in moderation, but skip sugary drinks and limit milk and dairy products to one or two daily servings.   The type of carbohydrate in the diet is more important than the amount. Some sources of carbohydrates, such as vegetables, fruits, whole grains, and beans-are healthier than others.   Finally, stay active  Signed, [name], DO  06/27/2020 8:33 AM    [facility]

## 2020-06-28 ENCOUNTER — Other Ambulatory Visit: Payer: Self-pay

## 2020-06-28 ENCOUNTER — Encounter (HOSPITAL_COMMUNITY): Payer: Self-pay | Admitting: *Deleted

## 2020-06-28 ENCOUNTER — Emergency Department (HOSPITAL_COMMUNITY)
Admission: EM | Admit: 2020-06-28 | Discharge: 2020-06-28 | Disposition: A | Discharge status: Home / Self Care | Payer: Medicare Other | Attending: Emergency Medicine | Admitting: Emergency Medicine

## 2020-06-28 DIAGNOSIS — Z992 Dependence on renal dialysis: Secondary | ICD-10-CM | POA: Diagnosis not present

## 2020-06-28 DIAGNOSIS — I5032 Chronic diastolic (congestive) heart failure: Secondary | ICD-10-CM | POA: Insufficient documentation

## 2020-06-28 DIAGNOSIS — N186 End stage renal disease: Secondary | ICD-10-CM | POA: Diagnosis not present

## 2020-06-28 DIAGNOSIS — J45901 Unspecified asthma with (acute) exacerbation: Secondary | ICD-10-CM | POA: Insufficient documentation

## 2020-06-28 DIAGNOSIS — I132 Hypertensive heart and chronic kidney disease with heart failure and with stage 5 chronic kidney disease, or end stage renal disease: Secondary | ICD-10-CM | POA: Insufficient documentation

## 2020-06-28 DIAGNOSIS — R0602 Shortness of breath: Secondary | ICD-10-CM | POA: Diagnosis not present

## 2020-06-28 DIAGNOSIS — R69 Illness, unspecified: Secondary | ICD-10-CM | POA: Diagnosis present

## 2020-06-28 DIAGNOSIS — Z79899 Other long term (current) drug therapy: Secondary | ICD-10-CM | POA: Insufficient documentation

## 2020-06-28 DIAGNOSIS — Z20822 Contact with and (suspected) exposure to covid-19: Secondary | ICD-10-CM | POA: Diagnosis not present

## 2020-06-28 LAB — CBC
HCT: 22.3 % — ABNORMAL LOW (ref 39.0–52.0)
Hemoglobin: 7.2 g/dL — ABNORMAL LOW (ref 13.0–17.0)
MCH: 34.8 pg — ABNORMAL HIGH (ref 26.0–34.0)
MCHC: 32.3 g/dL (ref 30.0–36.0)
MCV: 107.7 fL — ABNORMAL HIGH (ref 80.0–100.0)
Platelets: 123 10*3/uL — ABNORMAL LOW (ref 150–400)
RBC: 2.07 MIL/uL — ABNORMAL LOW (ref 4.22–5.81)
RDW: 13.9 % (ref 11.5–15.5)
WBC: 3.1 10*3/uL — ABNORMAL LOW (ref 4.0–10.5)
nRBC: 0 % (ref 0.0–0.2)

## 2020-06-28 LAB — RESPIRATORY PANEL BY RT PCR (FLU A&B, COVID)
Influenza A by PCR: NEGATIVE
Influenza B by PCR: NEGATIVE
SARS Coronavirus 2 by RT PCR: NEGATIVE

## 2020-06-28 LAB — BASIC METABOLIC PANEL
Anion gap: 17 — ABNORMAL HIGH (ref 5–15)
BUN: 91 mg/dL — ABNORMAL HIGH (ref 8–23)
CO2: 18 mmol/L — ABNORMAL LOW (ref 22–32)
Calcium: 8 mg/dL — ABNORMAL LOW (ref 8.9–10.3)
Chloride: 106 mmol/L (ref 98–111)
Creatinine, Ser: 12.01 mg/dL — ABNORMAL HIGH (ref 0.61–1.24)
GFR, Estimated: 4 mL/min — ABNORMAL LOW (ref >60)
Glucose, Bld: 112 mg/dL — ABNORMAL HIGH (ref 70–99)
Potassium: 5.3 mmol/L — ABNORMAL HIGH (ref 3.5–5.1)
Sodium: 141 mmol/L (ref 135–145)

## 2020-06-28 MED ORDER — ALTEPLASE 2 MG IJ SOLR: 2.0000 mg | Freq: Once | INTRAMUSCULAR | Status: DC | PRN

## 2020-06-28 MED ORDER — CHLORHEXIDINE GLUCONATE CLOTH 2 % EX PADS: 6.0000 | MEDICATED_PAD | Freq: Every day | CUTANEOUS | Status: DC

## 2020-06-28 MED ORDER — HEPARIN SODIUM (PORCINE) 1000 UNIT/ML DIALYSIS
1000.0000 [IU] | INTRAMUSCULAR | Status: DC | PRN
  Filled 2020-06-28: qty 1

## 2020-06-28 MED ORDER — SODIUM ZIRCONIUM CYCLOSILICATE 10 G PO PACK
10.0000 g | PACK | Freq: Once | ORAL | Status: AC
  Administered 2020-06-28: 10 g via ORAL
  Filled 2020-06-28: qty 1

## 2020-06-28 MED ORDER — SODIUM CHLORIDE 0.9 % IV SOLN: 100.0000 mL | INTRAVENOUS | Status: DC | PRN

## 2020-06-28 MED ORDER — LIDOCAINE HCL (PF) 1 % IJ SOLN: 5.0000 mL | INTRAMUSCULAR | Status: DC | PRN

## 2020-06-28 MED ORDER — LIDOCAINE-PRILOCAINE 2.5-2.5 % EX CREA
1.0000 "application " | TOPICAL_CREAM | CUTANEOUS | Status: DC | PRN
  Filled 2020-06-28: qty 5

## 2020-06-28 MED ORDER — PENTAFLUOROPROP-TETRAFLUOROETH EX AERO
1.0000 "application " | INHALATION_SPRAY | CUTANEOUS | Status: DC | PRN
  Filled 2020-06-28: qty 116

## 2020-06-28 NOTE — Progress Notes (Signed)
Pt came off 30 mins early, stating "I can't stay on any longer or I'll get sick" HD RN, explained what could happen to him for not finishing tx. Dr. Joelyn Oms notified

## 2020-06-28 NOTE — ED Triage Notes (Signed)
Arrives requesting dialysis, has not had it in 1 week.

## 2020-06-28 NOTE — Progress Notes (Signed)
Nephrology Quick Note  Aware that Mr. Thomas Robinson is in ED without HD x 1 week. He does not have a HD unit at this time after being involuntarily discharged from his prior clinic.  Vitals with hypertension. He is requiring nasal O2. K 5.3, CO2 18. Hgb pending.  Will plan on dialyzing today, then send back to the ED for discharge from there.  Needs COVID screen prior to dialysis - secure text sent to RN and ED MD regarding this.  Veneta Penton, PA-C [employer] Pager [phone]
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
## 2020-06-28 NOTE — ED Notes (Signed)
Pt left department while waiting to be [facility]

## 2020-07-03 ENCOUNTER — Emergency Department (HOSPITAL_COMMUNITY): Payer: Medicare Other

## 2020-07-03 ENCOUNTER — Emergency Department (HOSPITAL_BASED_OUTPATIENT_CLINIC_OR_DEPARTMENT_OTHER): Payer: Medicare Other

## 2020-07-03 ENCOUNTER — Other Ambulatory Visit: Payer: Self-pay

## 2020-07-03 ENCOUNTER — Encounter (HOSPITAL_COMMUNITY): Payer: Self-pay | Admitting: Emergency Medicine

## 2020-07-03 ENCOUNTER — Observation Stay (HOSPITAL_COMMUNITY)
Admission: EM | Admit: 2020-07-03 | Discharge: 2020-07-03 | Disposition: A | Discharge status: Home / Self Care | Payer: Medicare Other | Attending: Internal Medicine | Admitting: Internal Medicine

## 2020-07-03 DIAGNOSIS — G8929 Other chronic pain: Secondary | ICD-10-CM

## 2020-07-03 DIAGNOSIS — Z20822 Contact with and (suspected) exposure to covid-19: Secondary | ICD-10-CM | POA: Insufficient documentation

## 2020-07-03 DIAGNOSIS — I7121 Aneurysm of the ascending aorta, without rupture: Secondary | ICD-10-CM | POA: Diagnosis present

## 2020-07-03 DIAGNOSIS — N186 End stage renal disease: Secondary | ICD-10-CM | POA: Diagnosis not present

## 2020-07-03 DIAGNOSIS — M7989 Other specified soft tissue disorders: Secondary | ICD-10-CM

## 2020-07-03 DIAGNOSIS — R2232 Localized swelling, mass and lump, left upper limb: Secondary | ICD-10-CM | POA: Insufficient documentation

## 2020-07-03 DIAGNOSIS — M545 Low back pain, unspecified: Secondary | ICD-10-CM | POA: Diagnosis not present

## 2020-07-03 DIAGNOSIS — E8770 Fluid overload, unspecified: Secondary | ICD-10-CM | POA: Diagnosis present

## 2020-07-03 DIAGNOSIS — J45909 Unspecified asthma, uncomplicated: Secondary | ICD-10-CM | POA: Insufficient documentation

## 2020-07-03 DIAGNOSIS — Z992 Dependence on renal dialysis: Secondary | ICD-10-CM | POA: Diagnosis not present

## 2020-07-03 DIAGNOSIS — R0602 Shortness of breath: Principal | ICD-10-CM | POA: Insufficient documentation

## 2020-07-03 DIAGNOSIS — I5032 Chronic diastolic (congestive) heart failure: Secondary | ICD-10-CM | POA: Diagnosis not present

## 2020-07-03 DIAGNOSIS — E8779 Other fluid overload: Secondary | ICD-10-CM

## 2020-07-03 DIAGNOSIS — I132 Hypertensive heart and chronic kidney disease with heart failure and with stage 5 chronic kidney disease, or end stage renal disease: Secondary | ICD-10-CM | POA: Diagnosis not present

## 2020-07-03 DIAGNOSIS — Z79899 Other long term (current) drug therapy: Secondary | ICD-10-CM | POA: Diagnosis not present

## 2020-07-03 DIAGNOSIS — I714 Abdominal aortic aneurysm, without rupture: Secondary | ICD-10-CM | POA: Diagnosis not present

## 2020-07-03 DIAGNOSIS — D631 Anemia in chronic kidney disease: Secondary | ICD-10-CM

## 2020-07-03 DIAGNOSIS — I712 Thoracic aortic aneurysm, without rupture: Secondary | ICD-10-CM | POA: Diagnosis present

## 2020-07-03 DIAGNOSIS — M79671 Pain in right foot: Secondary | ICD-10-CM | POA: Diagnosis not present

## 2020-07-03 DIAGNOSIS — R0789 Other chest pain: Secondary | ICD-10-CM | POA: Diagnosis present

## 2020-07-03 LAB — CBC
HCT: 24.5 % — ABNORMAL LOW (ref 39.0–52.0)
Hemoglobin: 7.8 g/dL — ABNORMAL LOW (ref 13.0–17.0)
MCH: 35.6 pg — ABNORMAL HIGH (ref 26.0–34.0)
MCHC: 31.8 g/dL (ref 30.0–36.0)
MCV: 111.9 fL — ABNORMAL HIGH (ref 80.0–100.0)
Platelets: 155 10*3/uL (ref 150–400)
RBC: 2.19 MIL/uL — ABNORMAL LOW (ref 4.22–5.81)
RDW: 13.9 % (ref 11.5–15.5)
WBC: 5.1 10*3/uL (ref 4.0–10.5)
nRBC: 0 % (ref 0.0–0.2)

## 2020-07-03 LAB — I-STAT CHEM 8, ED
BUN: 84 mg/dL — ABNORMAL HIGH (ref 8–23)
Calcium, Ion: 0.92 mmol/L — ABNORMAL LOW (ref 1.15–1.40)
Chloride: 109 mmol/L (ref 98–111)
Creatinine, Ser: 11.8 mg/dL — ABNORMAL HIGH (ref 0.61–1.24)
Glucose, Bld: 94 mg/dL (ref 70–99)
HCT: 24 % — ABNORMAL LOW (ref 39.0–52.0)
Hemoglobin: 8.2 g/dL — ABNORMAL LOW (ref 13.0–17.0)
Potassium: 4.9 mmol/L (ref 3.5–5.1)
Sodium: 140 mmol/L (ref 135–145)
TCO2: 18 mmol/L — ABNORMAL LOW (ref 22–32)

## 2020-07-03 LAB — RESPIRATORY PANEL BY RT PCR (FLU A&B, COVID)
Influenza A by PCR: NEGATIVE
Influenza B by PCR: NEGATIVE
SARS Coronavirus 2 by RT PCR: NEGATIVE

## 2020-07-03 LAB — TROPONIN I (HIGH SENSITIVITY)
Troponin I (High Sensitivity): 57 ng/L — ABNORMAL HIGH (ref <18)
Troponin I (High Sensitivity): 52 ng/L — ABNORMAL HIGH (ref <18)

## 2020-07-03 LAB — BASIC METABOLIC PANEL
Anion gap: 21 — ABNORMAL HIGH (ref 5–15)
BUN: 79 mg/dL — ABNORMAL HIGH (ref 8–23)
CO2: 20 mmol/L — ABNORMAL LOW (ref 22–32)
Calcium: 8.4 mg/dL — ABNORMAL LOW (ref 8.9–10.3)
Chloride: 102 mmol/L (ref 98–111)
Creatinine, Ser: 10.37 mg/dL — ABNORMAL HIGH (ref 0.61–1.24)
GFR, Estimated: 5 mL/min — ABNORMAL LOW (ref >60)
Glucose, Bld: 115 mg/dL — ABNORMAL HIGH (ref 70–99)
Potassium: 4.6 mmol/L (ref 3.5–5.1)
Sodium: 143 mmol/L (ref 135–145)

## 2020-07-03 MED ORDER — SEVELAMER CARBONATE 800 MG PO TABS: 800.0000 mg | ORAL_TABLET | Freq: Every day | ORAL | Status: DC

## 2020-07-03 MED ORDER — AMLODIPINE BESYLATE 10 MG PO TABS: 10.0000 mg | ORAL_TABLET | Freq: Every day | ORAL | Status: DC

## 2020-07-03 MED ORDER — FERRIC CITRATE 1 GM 210 MG(FE) PO TABS: 420.0000 mg | ORAL_TABLET | Freq: Three times a day (TID) | ORAL | Status: DC

## 2020-07-03 MED ORDER — FUROSEMIDE 40 MG PO TABS: 40.0000 mg | ORAL_TABLET | ORAL | Status: DC

## 2020-07-03 MED ORDER — RENA-VITE PO TABS: 1.0000 | ORAL_TABLET | Freq: Every day | ORAL | Status: DC

## 2020-07-03 MED ORDER — HEPARIN SODIUM (PORCINE) 5000 UNIT/ML IJ SOLN: 5000.0000 [IU] | Freq: Three times a day (TID) | INTRAMUSCULAR | Status: DC

## 2020-07-03 MED ORDER — DARBEPOETIN ALFA 100 MCG/0.5ML IJ SOSY
100.0000 ug | PREFILLED_SYRINGE | INTRAMUSCULAR | Status: DC
  Administered 2020-07-03: 100 ug via INTRAVENOUS

## 2020-07-03 MED ORDER — ALBUTEROL SULFATE HFA 108 (90 BASE) MCG/ACT IN AERS: 2.0000 | INHALATION_SPRAY | Freq: Four times a day (QID) | RESPIRATORY_TRACT | Status: DC | PRN

## 2020-07-03 MED ORDER — CHLORHEXIDINE GLUCONATE CLOTH 2 % EX PADS: 6.0000 | MEDICATED_PAD | Freq: Every day | CUTANEOUS | Status: DC

## 2020-07-03 MED ORDER — HYDRALAZINE HCL 50 MG PO TABS: 50.0000 mg | ORAL_TABLET | Freq: Three times a day (TID) | ORAL | Status: DC

## 2020-07-03 MED ORDER — DARBEPOETIN ALFA 100 MCG/0.5ML IJ SOSY
PREFILLED_SYRINGE | INTRAMUSCULAR | Status: AC
  Filled 2020-07-03: qty 0.5

## 2020-07-03 MED ORDER — ISOSORBIDE MONONITRATE ER 30 MG PO TB24: 30.0000 mg | ORAL_TABLET | Freq: Every day | ORAL | Status: DC

## 2020-07-03 MED ORDER — ATORVASTATIN CALCIUM 10 MG PO TABS: 20.0000 mg | ORAL_TABLET | Freq: Every day | ORAL | Status: DC

## 2020-07-03 MED ORDER — CINACALCET HCL 30 MG PO TABS: 60.0000 mg | ORAL_TABLET | Freq: Every day | ORAL | Status: DC

## 2020-07-03 MED ORDER — METOPROLOL TARTRATE 100 MG PO TABS: 100.0000 mg | ORAL_TABLET | Freq: Two times a day (BID) | ORAL | Status: DC

## 2020-07-03 MED ORDER — GABAPENTIN 300 MG PO CAPS: 300.0000 mg | ORAL_CAPSULE | Freq: Two times a day (BID) | ORAL | Status: DC | PRN

## 2020-07-03 MED ORDER — ACETAMINOPHEN 325 MG PO TABS: 325.0000 mg | ORAL_TABLET | ORAL | Status: DC | PRN

## 2020-07-03 MED ORDER — CARVEDILOL 12.5 MG PO TABS: 12.5000 mg | ORAL_TABLET | Freq: Two times a day (BID) | ORAL | Status: DC
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
## 2020-07-03 NOTE — ED Notes (Signed)
Pt transported to vascular.  °

## 2020-07-03 NOTE — ED Provider Notes (Signed)
[facility] EMERGENCY DEPARTMENT Provider Note   CSN: [id number] Arrival date & time: 07/03/20  4098     History Chief Complaint  Patient presents with  . Vascular Access Problem  . Chest Pain    Thomas Robinson is a 59 y.o. male.  HPI      59 year old male with history of ESRD without current dialysis center, congestive heart failure, asthma, hypertension, on chronic 2 L o2 nasal cannula who presents with concern for increasing shortness of breath since yesterday, request for dialysis, swelling over the left arm, and color changes of the right foot.  Noted color changes of the right foot yesterday.  Denies trauma to the area.  Denies significant pain, but does note some pain with palpation.  Was told to stay off of it and has been with some improvement.  Denies fevers, chills.  Also notes swelling for 2 days of his left upper extremity.  Denies increased pain.  Shortness of breath increased yesterday.  His last dialysis was completed on Thursday here. He has been getting it here and other hospitals.   Past Medical History:  Diagnosis Date  . Acute exacerbation of congestive heart failure ([place]) 05/16/2019  . Acute kidney injury superimposed on CKD ([place]) 05/24/2017  . Acute on chronic diastolic CHF (congestive heart failure) ([place]) 04/13/2018  . Acute pulmonary edema ([place]) 01/23/2020  . Acute respiratory failure with hypoxia ([place]) 06/04/2019  . AKI (acute kidney injury) ([place]) 04/10/2015  . Anaphylactic shock, unspecified, sequela 06/10/2019  . Arthritis   . Asthma   . Asthma, chronic, unspecified asthma severity, with acute exacerbation 10/23/2017  . Atypical chest pain 02/16/2018  . CAP (community acquired pneumonia) 10/23/2017  . Carpal tunnel syndrome of right wrist 10/05/2018  . Cataract    right - removed by surgery  . Cerebral infarction due to thrombosis of cerebral artery ([place])   . Cerebrovascular accident (CVA) ([place])   . Cervical disc herniation 01/04/2020  .  Chest discomfort   . Chest pain 04/13/2018  . Chronic diastolic (congestive) heart failure ([place]) 06/04/2019  . Chronic diastolic heart failure ([place]) 04/13/2018  . Chronic kidney disease   . Chronic low back pain 11/24/2019  . CKD (chronic kidney disease), stage V ([place]) 05/24/2017  . Constipation   . Cough    chronic cough  . Demand ischemia ([place]) 06/18/2020  . Diastolic dysfunction 11/91/4782  . Dyspnea    "all the time" per patient at PAT 12/30/19  RA sats 87%, no oxygen use  . Encounter for immunization 07/08/2017  . Encounter for removal of sutures 03/29/2020  . ESRD (end stage renal disease) on dialysis ([place]) 02/16/2018   Dialysis  T Thus Sat  - [facility]   . Essential hypertension 04/09/2015  . Fall 06/04/2019  . Fall at home, initial encounter 06/04/2019  . GERD (gastroesophageal reflux disease) 06/04/2019  . GI bleeding 05/24/2017  . Gram-negative sepsis, unspecified ([place]) 09/05/2017  . History of completed stroke 2018  . History of fusion of cervical spine 03/26/2020  . Hyperlipidemia   . Hypertension   . Hypertensive urgency 05/24/2017  . Hypokalemia 06/04/2017  . Iron deficiency anemia, unspecified 09/09/2017  . Kidney failure   . Left shoulder pain 11/11/2019  . Lumbar radiculopathy 11/24/2019  . Lung contusion 06/04/2019  . LVH (left ventricular hypertrophy) due to hypertensive disease, with heart failure ([place]) 06/18/2020  . Macrocytic anemia 05/24/2017  . Moderate protein-calorie malnutrition ([place]) 06/06/2017  . Myofascial pain syndrome 06/12/2020  .  Neuritis of right ulnar nerve 09/13/2018  . Non-compliance with renal dialysis ([place]) 02/08/2020  . Other hyperlipidemia 06/04/2017  . Oxygen deficiency 12/30/2019   O2 sats on RA 87% at PAT appt   . Pain in joint of right elbow 09/13/2018  . Renovascular hypertension 06/22/2020  . Respiratory failure, acute ([place]) 10/23/2017  . Restless leg syndrome   . Rib fractures 06/2019   Right  . Right rib fracture 06/04/2019  . Secondary  hyperparathyroidism of renal origin ([place]) 06/04/2017  . SOB (shortness of breath)   . Stroke ([place]) 2018  . Symptomatic anemia 05/24/2017  . Tachycardia 01/04/2020  . Thoracic ascending aortic aneurysm ([facility])    4.5 cm 06/04/19 CT  . Ulnar neuropathy 10/05/2018  . Volume overload 10/23/2017    Patient Active Problem List   Diagnosis Date Noted  . Diastolic dysfunction 64/33/2951  . Renovascular hypertension 06/22/2020  . Demand ischemia ([place]) 06/18/2020  . LVH (left ventricular hypertrophy) due to hypertensive disease, with heart failure ([place]) 06/18/2020  . Myofascial pain syndrome 06/12/2020  . Encounter for removal of sutures 03/29/2020  . History of fusion of cervical spine 03/26/2020  . Non-compliance with renal dialysis ([place]) 02/08/2020  . Acute pulmonary edema ([place]) 01/23/2020  . Cervical disc herniation 01/04/2020  . Tachycardia 01/04/2020  . SOB (shortness of breath)   . Chronic low back pain 11/24/2019  . Degeneration of lumbar intervertebral disc 11/24/2019  . Lumbar radiculopathy 11/24/2019  . Left shoulder pain 11/11/2019  . Anaphylactic shock, unspecified, sequela 06/10/2019  . Rib fractures 06/05/2019  . Fall at home, initial encounter 06/04/2019  . Right rib fracture 06/04/2019  . Lung contusion 06/04/2019  . Acute respiratory failure with hypoxia ([place]) 06/04/2019  . Anemia in ESRD (end-stage renal disease) ([place]) 06/04/2019  . GERD (gastroesophageal reflux disease) 06/04/2019  . Chronic diastolic (congestive) heart failure ([place]) 06/04/2019  . Fall 06/04/2019  . Thoracic ascending aortic aneurysm ([place]) 06/04/2019  . Acute exacerbation of congestive heart failure ([place]) 05/16/2019  . Carpal tunnel syndrome of right wrist 10/05/2018  . Ulnar neuropathy 10/05/2018  . Neuritis of right ulnar nerve 09/13/2018  . Pain in joint of right elbow 09/13/2018  . Chest pain 04/13/2018  . Chronic diastolic heart failure ([place]) 04/13/2018  . Chest discomfort   . Atypical chest pain  02/16/2018  . ESRD on dialysis ([place]) 02/16/2018  . Volume overload 10/23/2017  . Iron deficiency anemia, unspecified 09/09/2017  . Gram-negative sepsis, unspecified ([place]) 09/05/2017  . Cough 08/23/2017  . Encounter for immunization 07/08/2017  . Moderate protein-calorie malnutrition ([place]) 06/06/2017  . Coagulation defect, unspecified ([place]) 06/04/2017  . Hypokalemia 06/04/2017  . Other hyperlipidemia 06/04/2017  . Secondary hyperparathyroidism of renal origin ([place]) 06/04/2017  . Constipation   . Macrocytic anemia 05/24/2017  . Hypertensive urgency 05/24/2017  . History of completed stroke   . Essential hypertension 04/09/2015    Past Surgical History:  Procedure Laterality Date  . ANTERIOR CERVICAL DECOMP/DISCECTOMY FUSION N/A 01/04/2020   Procedure: ANTERIOR CERVICAL DECOMPRESSION/DISCECTOMY FUSION CERVICAL FIVE THROUGH SEVEN;  Surgeon: Melina Schools, MD;  Location: [facility];  Service: Orthopedics;  Laterality: N/A;  3 hrs  . AV FISTULA PLACEMENT Left 05/28/2017   Procedure: LEFT ARM ARTERIOVENOUS (AV) FISTULA CREATION;  Surgeon: Conrad Shady Dale, MD;  Location: [facility];  Service: Vascular;  Laterality: Left;  . EYE SURGERY Right 06/02/2019   Cataract removed  . FRACTURE SURGERY    . IR FLUORO GUIDE CV LINE RIGHT  05/25/2017  .  IR US GUIDE VASC ACCESS RIGHT  05/25/2017  . REVISON OF ARTERIOVENOUS FISTULA Left 07/18/2019   Procedure: REVISION PLICATION OF RADIOCEPHALIC ARTERIOVENOUS FISTULA LEFT ARM;  Surgeon: Angelia Mould, MD;  Location: [facility];  Service: Vascular;  Laterality: Left;  . RINOPLASTY         Family History  Problem Relation Age of Onset  . Cancer Mother     Social History   Tobacco Use  . Smoking status: Never Smoker  . Smokeless tobacco: Never Used  Vaping Use  . Vaping Use: Never used  Substance Use Topics  . Alcohol use: Not Currently    Alcohol/week: 0.0 standard drinks    Comment: has a drink once in a while- 12/30/19  . Drug use: No    Home  Medications Prior to Admission medications   Medication Sig Start Date End Date Taking? Authorizing Provider  albuterol (VENTOLIN HFA) 108 (90 Base) MCG/ACT inhaler Inhale 2 puffs into the lungs every 6 (six) hours as needed.    [provider]  amLODipine (NORVASC) 10 MG tablet Take 10 mg by mouth daily.     [provider]  atorvastatin (LIPITOR) 20 MG tablet Take 20 mg by mouth daily. 04/19/20   [provider]  AURYXIA 1 GM 210 MG(Fe) tablet Take 420 mg by mouth 3 (three) times daily. 03/16/20   [provider]  carvedilol (COREG) 12.5 MG tablet Take 1 tablet (12.5 mg total) by mouth 2 (two) times daily with a meal. 05/18/19   Martyn Malay, MD  cinacalcet (SENSIPAR) 60 MG tablet Take 60 mg by mouth daily. 11/11/18   [provider]  furosemide (LASIX) 40 MG tablet Take 40 mg of Lasix on non-dialysis days. Patient taking differently: Take 40 mg by mouth See admin instructions. On Sunday, Monday, Wednesday, Thursday and Saturday 03/21/20   Tobb, Kardie, DO  gabapentin (NEURONTIN) 300 MG capsule Take 300 mg by mouth 2 (two) times daily as needed (pain).     [provider]  hydrALAZINE (APRESOLINE) 100 MG tablet Take 1 tablet (100 mg total) by mouth every 8 (eight) hours. 05/18/19   Martyn Malay, MD  hydrALAZINE (APRESOLINE) 25 MG tablet Take 50 mg by mouth 3 (three) times daily. 06/02/20   [provider]  isosorbide mononitrate (IMDUR) 30 MG 24 hr tablet Take 1 tablet (30 mg total) by mouth daily. 07/15/19 06/06/29  Tobb, Kardie, DO  metoprolol tartrate (LOPRESSOR) 100 MG tablet Take 100 mg by mouth 2 (two) times daily.     [provider]  multivitamin (RENA-VIT) TABS tablet Take 1 tablet by mouth daily. 01/12/18   [provider]  sevelamer carbonate (RENVELA) 800 MG tablet Take 800 mg by mouth daily. 11/07/19   [provider]    Allergies    Patient has no known allergies.  Review of Systems   Review  of Systems  Constitutional: Negative for fever.  HENT: Negative for sore throat.   Eyes: Negative for visual disturbance.  Respiratory: Positive for cough (began today) and shortness of breath.   Cardiovascular: Negative for chest pain.  Gastrointestinal: Negative for abdominal pain.  Genitourinary: Negative for difficulty urinating.  Musculoskeletal: Negative for back pain and neck stiffness.  Skin: Negative for rash.  Neurological: Negative for syncope and headaches.    Physical Exam Updated Vital Signs BP (!) 162/69   Pulse (!) 59   Temp 98.6 F (37 C) (Oral)   Resp 16   SpO2 97%  Physical Exam Vitals and nursing note reviewed.  Constitutional:      General: He is not in acute distress.    Appearance: He is well-developed. He is not diaphoretic.  HENT:     Head: Normocephalic and atraumatic.  Eyes:     Conjunctiva/sclera: Conjunctivae normal.  Cardiovascular:     Rate and Rhythm: Normal rate and regular rhythm.     Heart sounds: Normal heart sounds. No murmur heard.  No friction rub. No gallop.   Pulmonary:     Effort: Pulmonary effort is normal. No respiratory distress.     Breath sounds: Normal breath sounds. No wheezing or rales.  Abdominal:     General: There is no distension.     Palpations: Abdomen is soft.     Tenderness: There is no abdominal tenderness. There is no guarding.  Musculoskeletal:     Cervical back: Normal range of motion.     Comments: To MTP toes of right foot Appearance of contusion, normal capillary refill, normal pulses, warmth  Skin:    General: Skin is warm and dry.     Comments: See photo regarding skin changes, appearance of contusion over dorsum of foot/toes, one area extending to plantar side, no evidence of other skin lesions to plantar surface of feet or palmar hands  Neurological:     Mental Status: He is alert and oriented to person, place, and time.          ED Results / Procedures / Treatments   Labs (all labs  ordered are listed, but only abnormal results are displayed) Labs Reviewed  I-STAT CHEM 8, ED - Abnormal; Notable for the following components:      Result Value   BUN 84 (*)    Creatinine, Ser 11.80 (*)    Calcium, Ion 0.92 (*)    TCO2 18 (*)    Hemoglobin 8.2 (*)    HCT 24.0 (*)    All other components within normal limits  TROPONIN I (HIGH SENSITIVITY) - Abnormal; Notable for the following components:   Troponin I (High Sensitivity) 52 (*)    All other components within normal limits  RESPIRATORY PANEL BY RT PCR (FLU A&B, COVID)    EKG EKG Interpretation  Date/Time:  Tuesday July 03 2020 06:41:46 EDT Ventricular Rate:  67 PR Interval:    QRS Duration: 112 QT Interval:  472 QTC Calculation: 499 R Axis:   57 Text Interpretation: Sinus rhythm Atrial premature complex Consider left atrial enlargement Probable left ventricular hypertrophy Borderline prolonged QT interval Similar appearing TW in comparison to prior ECG Confirmed by Gareth Morgan [phone]) on 07/03/2020 9:05:02 AM   Radiology DG Chest 2 View  Result Date: 07/03/2020 CLINICAL DATA:  Chest pain. EXAM: CHEST - 2 VIEW COMPARISON:  06/15/2020. FINDINGS: Cardiomegaly with pulmonary venous congestion and bilateral interstitial prominence suggesting CHF. Pneumonitis cannot be excluded. Interstitial prominence has increased from prior study of 06/15/2020. Small bilateral pleural effusions. No pneumothorax. Prior cervical spine fusion. Degenerative change thoracic spine. IMPRESSION: Cardiomegaly with pulmonary venous congestion and bilateral interstitial prominence suggesting CHF. Pneumonitis cannot be excluded. Interstitial prominence has increased from prior study of 06/15/2020. Small bilateral pleural effusions. Electronically Signed   By: Marcello Moores  Register   On: 07/03/2020 07:06   DG Foot Complete Right  Result Date: 07/03/2020 CLINICAL DATA:  Pain and swelling.  3rd and fourth toes are blue. EXAM: RIGHT FOOT COMPLETE -  3+ VIEW COMPARISON:  No comparison studies available. FINDINGS: No evidence for fracture. No  subluxation or dislocation. No bony destruction or erosion to suggest osteomyelitis. IMPRESSION: Negative. Electronically Signed   By: Misty Stanley M.D.   On: 07/03/2020 10:10    Procedures Procedures (including critical care time)  Medications Ordered in ED Medications  Chlorhexidine Gluconate Cloth 2 % PADS 6 each (has no administration in time range)  Darbepoetin Alfa (ARANESP) injection 100 mcg (has no administration in time range)  Chlorhexidine Gluconate Cloth 2 % PADS 6 each (has no administration in time range)    ED Course  I have reviewed the triage vital signs and the nursing notes.  Pertinent labs & imaging results that were available during my care of the patient were reviewed by me and considered in my medical decision making (see chart for details).    MDM Rules/Calculators/A&P                          59 year old male with history of ESRD without current dialysis center, congestive heart failure, thoracic aortic aneurysm, asthma, hypertension, on chronic 2 L o2 nasal cannula who presents with concern for increasing shortness of breath since yesterday, request for dialysis, swelling over the left arm, and color changes of the right foot.  Regarding right foot, appears to be more consistent contusion then blue toe syndrome. XR without acute abnormalities. Normal cap refill, normal pulses to foot. No other extremities with signs of abnormalities, no petechaie or other color change to palms or soles.  Given localized area with appearance more consistent iwht bruising and overlying tenderness feel this is more likely. Vascular surgery is in OR at this time--did discuss and they will come evaluate to confirm color changes due not appear consistent with blue toe syndrome given risk factors and pt without memory of trauma.  Regarding arm, thrill present over graft, no sign of overlying  erythema.  DVT study ordered given presence of edema.   Dyspnea secondary to volume overload in presence of not having dialysis. Discussed with Nephrology, Dr. Jonnie Finner, will admit to medicine for further care and dialysis.     Final Clinical Impression(s) / ED Diagnoses Final diagnoses:  Shortness of breath  Hypervolemia, unspecified hypervolemia type  Dialysis patient ([place])  Left arm swelling    Rx / DC Orders ED Discharge Orders    None       Gareth Morgan, MD 07/03/20 1735

## 2020-07-03 NOTE — ED Triage Notes (Signed)
Patient is here requesting dialysis.  Patient also states that his right foot is black.  He was told to stay off his feet.  Patient's last dialysis was last Thursday.

## 2020-07-03 NOTE — ED Notes (Signed)
Dr. Tobie Poet made this RN aware pt wants to leave AMA. Dr. Tobie Poet advised the pt he needs to stay however pt states he wants to leave anywhere.

## 2020-07-03 NOTE — Consult Note (Addendum)
REASON FOR CONSULT:    Possible blue toe syndrome.  The consult is requested by the emergency department.  ASSESSMENT & PLAN:   ECCHYMOSIS RIGHT FOOT: Patient has bruising in his right foot and likely dropped something on this.  He has palpable pedal pulses and no further vascular work-up is indicated.  This does not look like blue toe syndrome.  ASCENDING AORTIC ANEURYSM:: The patient is aware of this and is followed by Dr. Harriet Masson of [facility].   ESRD: Currently has functional left radiocephalic AV fistula.  His duplex today shows no evidence of upper extremity DVT on the left.  His fistula has an excellent thrill.  Risa Grill, PA-C Office: [phone]  I have interviewed the patient and examined the patient. I agree with the findings by the PA.  Okay to discharge from vascular standpoint.  No further work-up is indicated.  Gae Gallop, MD [phone]   HPI:   Thomas Robinson is a pleasant 59 y.o. male, who presented to the emergency department yesterday having missed several hemodialysis treatments.  His primary complaint was persistent cough. The patient had a brief episode of nonexertional anterior chest pressure and tightness that resolved spontaneously yesterday prior to admission.  The patient is interviewed and examined in the inpatient hemodialysis unit where he is undergoing treatment via left radiocephalic AV fistula.  He currently denies chest pain or shortness of breath.  He states he stumbled and injured his right foot several days ago.  He denies claudication or rest pain.  Denies abdominal pain.  The patient has a history of back injury and states his orthopedic spine surgeon has recommended surgery but is delaying this until the patient's comorbidities are optimized.  He denies nausea, vomiting, fever or chills.  He denies hand pain.  PMH: Significant for hypertension, hypertensive nephropathy, heart failure, dyslipidemia. He is not diabetic. He is on no  blood-thinning medication.  PSH: significant for left R-C AVF; revision/plication 42/35/3614. Anterior cervical fusion.  Past Medical History:  Diagnosis Date  . Acute exacerbation of congestive heart failure ([place]) 05/16/2019  . Acute kidney injury superimposed on CKD ([place]) 05/24/2017  . Acute on chronic diastolic CHF (congestive heart failure) ([place]) 04/13/2018  . Acute pulmonary edema ([place]) 01/23/2020  . Acute respiratory failure with hypoxia ([place]) 06/04/2019  . AKI (acute kidney injury) ([place]) 04/10/2015  . Anaphylactic shock, unspecified, sequela 06/10/2019  . Arthritis   . Asthma   . Asthma, chronic, unspecified asthma severity, with acute exacerbation 10/23/2017  . Atypical chest pain 02/16/2018  . CAP (community acquired pneumonia) 10/23/2017  . Carpal tunnel syndrome of right wrist 10/05/2018  . Cataract    right - removed by surgery  . Cerebral infarction due to thrombosis of cerebral artery ([place])   . Cerebrovascular accident (CVA) ([place])   . Cervical disc herniation 01/04/2020  . Chest discomfort   . Chest pain 04/13/2018  . Chronic diastolic (congestive) heart failure ([place]) 06/04/2019  . Chronic diastolic heart failure ([place]) 04/13/2018  . Chronic kidney disease   . Chronic low back pain 11/24/2019  . CKD (chronic kidney disease), stage V ([place]) 05/24/2017  . Constipation   . Cough    chronic cough  . Demand ischemia ([place]) 06/18/2020  . Diastolic dysfunction 43/15/4008  . Dyspnea    "all the time" per patient at PAT 12/30/19  RA sats 87%, no oxygen use  . Encounter for immunization 07/08/2017  . Encounter for removal of sutures 03/29/2020  . ESRD (end  stage renal disease) on dialysis ([place]) 02/16/2018   Dialysis  T Thus Sat  - [facility]   . Essential hypertension 04/09/2015  . Fall 06/04/2019  . Fall at home, initial encounter 06/04/2019  . GERD (gastroesophageal reflux disease) 06/04/2019  . GI bleeding 05/24/2017  . Gram-negative sepsis, unspecified ([place]) 09/05/2017  . History of  completed stroke 2018  . History of fusion of cervical spine 03/26/2020  . Hyperlipidemia   . Hypertension   . Hypertensive urgency 05/24/2017  . Hypokalemia 06/04/2017  . Iron deficiency anemia, unspecified 09/09/2017  . Kidney failure   . Left shoulder pain 11/11/2019  . Lumbar radiculopathy 11/24/2019  . Lung contusion 06/04/2019  . LVH (left ventricular hypertrophy) due to hypertensive disease, with heart failure ([place]) 06/18/2020  . Macrocytic anemia 05/24/2017  . Moderate protein-calorie malnutrition ([place]) 06/06/2017  . Myofascial pain syndrome 06/12/2020  . Neuritis of right ulnar nerve 09/13/2018  . Non-compliance with renal dialysis ([place]) 02/08/2020  . Other hyperlipidemia 06/04/2017  . Oxygen deficiency 12/30/2019   O2 sats on RA 87% at PAT appt   . Pain in joint of right elbow 09/13/2018  . Renovascular hypertension 06/22/2020  . Respiratory failure, acute ([place]) 10/23/2017  . Restless leg syndrome   . Rib fractures 06/2019   Right  . Right rib fracture 06/04/2019  . Secondary hyperparathyroidism of renal origin ([place]) 06/04/2017  . SOB (shortness of breath)   . Stroke ([place]) 2018  . Symptomatic anemia 05/24/2017  . Tachycardia 01/04/2020  . Thoracic ascending aortic aneurysm ([facility])    4.5 cm 06/04/19 CT  . Ulnar neuropathy 10/05/2018  . Volume overload 10/23/2017    Family History  Problem Relation Age of Onset  . Cancer Mother     SOCIAL HISTORY: Social History   Socioeconomic History  . Marital status: Single    Spouse name: Not on file  . Number of children: Not on file  . Years of education: Not on file  . Highest education level: Not on file  Occupational History  . Not on file  Tobacco Use  . Smoking status: Never Smoker  . Smokeless tobacco: Never Used  Vaping Use  . Vaping Use: Never used  Substance and Sexual Activity  . Alcohol use: Not Currently    Alcohol/week: 0.0 standard drinks    Comment: has a drink once in a while- 12/30/19  . Drug use: No  . Sexual  activity: Not Currently  Other Topics Concern  . Not on file  Social History Narrative   Lives with a roommate in a one story home.  Has 1 child.  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  Education: high school.   Social Determinants of Health   Financial Resource Strain:   . Difficulty of Paying Living Expenses: Not on file  Food Insecurity:   . Worried About Charity fundraiser in the Last Year: Not on file  . Ran Out of Food in the Last Year: Not on file  Transportation Needs:   . Lack of Transportation (Medical): Not on file  . Lack of Transportation (Non-Medical): Not on file  Physical Activity:   . Days of Exercise per Week: Not on file  . Minutes of Exercise per Session: Not on file  Stress:   . Feeling of Stress : Not on file  Social Connections:   . Frequency of Communication with Friends and Family: Not on file  . Frequency of Social Gatherings with Friends and Family:  Not on file  . Attends Religious Services: Not on file  . Active Member of Clubs or Organizations: Not on file  . Attends Archivist Meetings: Not on file  . Marital Status: Not on file  Intimate Partner Violence:   . Fear of Current or Ex-Partner: Not on file  . Emotionally Abused: Not on file  . Physically Abused: Not on file  . Sexually Abused: Not on file    No Known Allergies  Current Facility-Administered Medications  Medication Dose Route Frequency Provider Last Rate Last Admin  . Chlorhexidine Gluconate Cloth 2 % PADS 6 each  6 each Topical Q0600 Janalee Dane, PA-C      . Chlorhexidine Gluconate Cloth 2 % PADS 6 each  6 each Topical Q0600 Janalee Dane, PA-C      . Darbepoetin Alfa (ARANESP) injection 100 mcg  100 mcg Intravenous Q Tue-HD Collins, Samantha G, PA-C        REVIEW OF SYSTEMS:  [X]  denotes positive finding, [ ]  denotes negative finding Cardiac  Comments:  Chest pain or chest pressure: x   Shortness of breath upon exertion:    Short of breath when lying  flat:    Irregular heart rhythm: x       Vascular    Pain in calf, thigh, or hip brought on by ambulation:    Pain in feet at night that wakes you up from your sleep:     Blood clot in your veins:    Leg swelling:  x       Pulmonary    Oxygen at home: x   Productive cough:     Wheezing:         Neurologic    Sudden weakness in arms or legs:     Sudden numbness in arms or legs:     Sudden onset of difficulty speaking or slurred speech:    Temporary loss of vision in one eye:     Problems with dizziness:         Gastrointestinal    Blood in stool:     Vomited blood:         Genitourinary    Burning when urinating:     Blood in urine:        Psychiatric    Major depression:         Hematologic    Bleeding problems:    Problems with blood clotting too easily:        Skin    Rashes or ulcers:        Constitutional    Fever or chills:     PHYSICAL EXAM:   Vitals:   07/03/20 1123 07/03/20 1130 07/03/20 1200 07/03/20 1300  BP: (!) 169/73 (!) 171/83 (!) 162/74 (!) 157/80  Pulse: 80 62  (!) 59  Resp: 17 18 15 17   Temp:      TempSrc:      SpO2:    97%  Weight:        GENERAL: The patient is a well-nourished male, in no acute distress. The vital signs are documented above. CARDIAC: There is a irregular  rhythm.  VASCULAR: No carotid bruits appreciated.  3+ right brachial pulse, 2+ right radial pulse. 2+ femoral pulses bilaterally.  3+ dorsalis pedis pulses bilaterally. PULMONARY: There is good air exchange bilaterally without wheezing or rales. ABDOMEN: Soft and non-tender with normal pitched bowel sounds.  No pulsatile mass MUSCULOSKELETAL: There are no major deformities or  cyanosis.  2+ pitting ankle edema.  Resolving ecchymosis involving the dorsal aspect of the second, third and fourth toes. NEUROLOGIC: No focal weakness or paresthesias are detected. SKIN: There are no ulcers or rashes noted. PSYCHIATRIC: The patient has a normal affect. LUE: There is mild  edema of the left upper extremity extending to the upper arm.  He has left radiocephalic arteriovenous fistula is currently accessed.  He has a strong palpable thrill.  DATA:    The patient had an EKG in emergency department upon presentation.  He was in sinus rhythm with no ischemic changes.  Lab work revealed elevated potassium to 5.3.
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
## 2020-07-03 NOTE — Procedures (Signed)
   I was present at this dialysis session, have reviewed the session itself and made  appropriate changes [name] MD [place] pager [phone]   07/03/2020, 3:32 PM

## 2020-07-03 NOTE — Progress Notes (Signed)
Attempted upper extremity venous x2. First attempt patient was in hemodialysis, second attempt patient cannot be located. Please page [place] through the operator if the patient returns.  07/03/2020 4:37 PM Kelby Aline., MHA, RVT, RDCS, RDMS

## 2020-07-03 NOTE — ED Notes (Signed)
This RN and Clifton James EMT spoke to pt and encouraged him to stay. Pt stated that he should not have been told he could leave by dialysis. Staff continued to encourage but pt stated he will be fine. Pt ambulated to lobby. Pt verbalized understanding he was leaving AMA

## 2020-07-03 NOTE — ED Notes (Signed)
Pt transported to vascular before vital signs could be updated

## 2020-07-10 ENCOUNTER — Emergency Department (HOSPITAL_COMMUNITY)
Admission: EM | Admit: 2020-07-10 | Discharge: 2020-07-10 | Discharge status: Against Medical Advice | Payer: Medicare Other | Attending: Emergency Medicine | Admitting: Emergency Medicine

## 2020-07-10 ENCOUNTER — Encounter (HOSPITAL_COMMUNITY): Payer: Self-pay | Admitting: Emergency Medicine

## 2020-07-10 DIAGNOSIS — J45909 Unspecified asthma, uncomplicated: Secondary | ICD-10-CM | POA: Diagnosis not present

## 2020-07-10 DIAGNOSIS — Z7982 Long term (current) use of aspirin: Secondary | ICD-10-CM | POA: Insufficient documentation

## 2020-07-10 DIAGNOSIS — D631 Anemia in chronic kidney disease: Secondary | ICD-10-CM | POA: Insufficient documentation

## 2020-07-10 DIAGNOSIS — N186 End stage renal disease: Secondary | ICD-10-CM | POA: Insufficient documentation

## 2020-07-10 DIAGNOSIS — I5033 Acute on chronic diastolic (congestive) heart failure: Secondary | ICD-10-CM | POA: Diagnosis not present

## 2020-07-10 DIAGNOSIS — I132 Hypertensive heart and chronic kidney disease with heart failure and with stage 5 chronic kidney disease, or end stage renal disease: Secondary | ICD-10-CM | POA: Insufficient documentation

## 2020-07-10 DIAGNOSIS — Z20822 Contact with and (suspected) exposure to covid-19: Secondary | ICD-10-CM | POA: Diagnosis not present

## 2020-07-10 DIAGNOSIS — R531 Weakness: Secondary | ICD-10-CM | POA: Diagnosis present

## 2020-07-10 DIAGNOSIS — Z992 Dependence on renal dialysis: Secondary | ICD-10-CM | POA: Diagnosis not present

## 2020-07-10 DIAGNOSIS — Z79899 Other long term (current) drug therapy: Secondary | ICD-10-CM | POA: Insufficient documentation

## 2020-07-10 LAB — COMPREHENSIVE METABOLIC PANEL
ALT: 11 U/L (ref 0–44)
AST: 8 U/L — ABNORMAL LOW (ref 15–41)
Albumin: 3.6 g/dL (ref 3.5–5.0)
Alkaline Phosphatase: 74 U/L (ref 38–126)
Anion gap: 23 — ABNORMAL HIGH (ref 5–15)
BUN: 95 mg/dL — ABNORMAL HIGH (ref 8–23)
CO2: 17 mmol/L — ABNORMAL LOW (ref 22–32)
Calcium: 8.5 mg/dL — ABNORMAL LOW (ref 8.9–10.3)
Chloride: 102 mmol/L (ref 98–111)
Creatinine, Ser: 12.2 mg/dL — ABNORMAL HIGH (ref 0.61–1.24)
GFR, Estimated: 4 mL/min — ABNORMAL LOW (ref >60)
Glucose, Bld: 110 mg/dL — ABNORMAL HIGH (ref 70–99)
Potassium: 5.1 mmol/L (ref 3.5–5.1)
Sodium: 142 mmol/L (ref 135–145)
Total Bilirubin: 0.7 mg/dL (ref 0.3–1.2)
Total Protein: 6.3 g/dL — ABNORMAL LOW (ref 6.5–8.1)

## 2020-07-10 LAB — CBC WITH DIFFERENTIAL/PLATELET
Abs Immature Granulocytes: 0 10*3/uL (ref 0.00–0.07)
Basophils Absolute: 0.1 10*3/uL (ref 0.0–0.1)
Basophils Relative: 3 %
Eosinophils Absolute: 0.3 10*3/uL (ref 0.0–0.5)
Eosinophils Relative: 6 %
HCT: 25 % — ABNORMAL LOW (ref 39.0–52.0)
Hemoglobin: 7.3 g/dL — ABNORMAL LOW (ref 13.0–17.0)
Lymphocytes Relative: 8 %
Lymphs Abs: 0.4 10*3/uL — ABNORMAL LOW (ref 0.7–4.0)
MCH: 34.9 pg — ABNORMAL HIGH (ref 26.0–34.0)
MCHC: 29.2 g/dL — ABNORMAL LOW (ref 30.0–36.0)
MCV: 119.6 fL — ABNORMAL HIGH (ref 80.0–100.0)
Monocytes Absolute: 0.2 10*3/uL (ref 0.1–1.0)
Monocytes Relative: 4 %
Neutro Abs: 3.7 10*3/uL (ref 1.7–7.7)
Neutrophils Relative %: 79 %
Platelets: 153 10*3/uL (ref 150–400)
RBC: 2.09 MIL/uL — ABNORMAL LOW (ref 4.22–5.81)
RDW: 14.1 % (ref 11.5–15.5)
WBC: 4.7 10*3/uL (ref 4.0–10.5)
nRBC: 0 % (ref 0.0–0.2)
nRBC: 0 /100 WBC

## 2020-07-10 LAB — RESPIRATORY PANEL BY RT PCR (FLU A&B, COVID)
Influenza A by PCR: NEGATIVE
Influenza B by PCR: NEGATIVE
SARS Coronavirus 2 by RT PCR: NEGATIVE

## 2020-07-10 LAB — HEPATITIS B SURFACE ANTIGEN: Hepatitis B Surface Ag: NONREACTIVE

## 2020-07-10 MED ORDER — CHLORHEXIDINE GLUCONATE CLOTH 2 % EX PADS: 6.0000 | MEDICATED_PAD | Freq: Every day | CUTANEOUS | Status: DC

## 2020-07-10 MED ORDER — DARBEPOETIN ALFA 100 MCG/0.5ML IJ SOSY
100.0000 ug | PREFILLED_SYRINGE | Freq: Once | INTRAMUSCULAR | Status: AC
  Administered 2020-07-10: 100 ug via INTRAVENOUS

## 2020-07-10 MED ORDER — DARBEPOETIN ALFA 100 MCG/0.5ML IJ SOSY
PREFILLED_SYRINGE | INTRAMUSCULAR | Status: AC
  Filled 2020-07-10: qty 0.5

## 2020-07-10 NOTE — Progress Notes (Signed)
Pt requested to stop the tx d/t cramping; took the pt out of UF but pt insisted he wanted to to stop the HD tx. This has been an on going pattern for the patient. AMA form given to the and HD tx was terminated.

## 2020-07-10 NOTE — ED Notes (Signed)
Pt transported back to floor from dialysis and then eloped

## 2020-07-10 NOTE — Procedures (Signed)
I was present at this dialysis session, have reviewed the session itself and made  appropriate changes  Seen resting on dialysis.  UFG 4.5L Qb 400.  Per RN no issue.    Jannifer Hick MD [facility] pager [phone]   07/10/2020, 2:56 PM

## 2020-07-10 NOTE — Progress Notes (Signed)
Thomas Robinson is 59 year old male with ESRD on HD. Presents to the ED about once a week for dialysis as he was discharged from his outpatient dialysis center d/t behavioral issues.  His last dialysis was here on 07/03/20.   Labs today K 5.1, CO2 17, BUN 95 Cr 12.2 Hgb 7.3  Covid screen pending  Will plan to dialyze today. 4h treatment Give ESA with HD today Reassess after in the ED.  Full consult if changes to inpatient status   Less Thomas Larina Earthly PA-C [facility] 07/10/2020,11:03 AM

## 2020-07-10 NOTE — ED Provider Notes (Addendum)
[facility] EMERGENCY DEPARTMENT Provider Note   CSN: [id number] Arrival date & time: 07/10/20  0547     History Chief Complaint  Patient presents with  . needs dialysis    Thomas Robinson is a 59 y.o. male.  Patient with hx esrd/hd, states has no current HD center (due to interpersonal conflict issue with tech at his prior facility). Has been on HD for 3 years. Last dialyzed 1 week ago. Pt notes general weakness, gradual onset in past few days, moderate, persistent, constant, slowly worse. Denies chest pain or sob. No abd pain or nvd. No focal or unilateral numbness/weakness. No fever or chills. +bilateral foot/ankle edema, mildly increased.   The history is provided by the patient.       Past Medical History:  Diagnosis Date  . Acute exacerbation of congestive heart failure ([place]od[place]) 05/16/2019  . Acute kidney injury superimposed on CKD ([place]calde) 05/24/2017  . Acute on chronic diastolic CHF (congestive heart failure) ([place]) 04/13/2018  . Acute pulmonary edema ([place]el[place]) 01/23/2020  . Acute respiratory failure with hypoxia (Zoar) 06/04/2019  . AKI (acute kidney injury) (One[place]) 04/10/2015  . Anaphylactic shock, unspecified, sequela 06/10/2019  . Arthritis   . Asthma   . Asthma, chronic, unspecified asthma severity, with acute exacerbation 10/23/2017  . Atypical chest pain 02/16/2018  . CAP (community acquired pneumonia) 10/23/2017  . Carpal tunnel syndrome of right wrist 10/05/2018  . Cataract    right - removed by surgery  . Cerebral infarction due to thrombosis of cerebral artery ([place])   . Cerebrovascular accident (CVA) ([place])   . Cervical disc herniation 01/04/2020  . Chest discomfort   . Chest pain 04/13/2018  . Chronic diastolic (congestive) heart failure ([place]) 06/04/2019  . Chronic diastolic heart failure ([place]land) 04/13/2018  . Chronic kidney disease   . Chronic low back pain 11/24/2019  . CKD (chronic kidney disease), stage V ([place]omona) 05/24/2017  . Constipation   . Cough    chronic  cough  . Demand ischemia (Flora) 06/18/2020  . Diastolic dysfunction 62/70/3500  . Dyspnea    "all the time" per patient at PAT 12/30/19  RA sats 87%, no oxygen use  . Encounter for immunization 07/08/2017  . Encounter for removal of sutures 03/29/2020  . ESRD (end stage renal disease) on dialysis ([place]comer[place]) 02/16/2018   Dialysis  T Thus Sat  - [facility]   . Essential hypertension 04/09/2015  . Fall 06/04/2019  . Fall at home, initial encounter 06/04/2019  . GERD (gastroesophageal reflux disease) 06/04/2019  . GI bleeding 05/24/2017  . Gram-negative sepsis, unspecified ([place]) 09/05/2017  . History of completed stroke 2018  . History of fusion of cervical spine 03/26/2020  . Hyperlipidemia   . Hypertension   . Hypertensive urgency 05/24/2017  . Hypokalemia 06/04/2017  . Iron deficiency anemia, unspecified 09/09/2017  . Kidney failure   . Left shoulder pain 11/11/2019  . Lumbar radiculopathy 11/24/2019  . Lung contusion 06/04/2019  . LVH (left ventricular hypertrophy) due to hypertensive disease, with heart failure (Amoret) 06/18/2020  . Macrocytic anemia 05/24/2017  . Moderate protein-calorie malnutrition (Mary Esther) 06/06/2017  . Myofascial pain syndrome 06/12/2020  . Neuritis of right ulnar nerve 09/13/2018  . Non-compliance with renal dialysis (Clarissa) 02/08/2020  . Other hyperlipidemia 06/04/2017  . Oxygen deficiency 12/30/2019   O2 sats on RA 87% at PAT appt   . Pain in joint of right elbow 09/13/2018  . Renovascular hypertension 06/22/2020  . Respiratory failure, acute (Gerber) 10/23/2017  .  Restless leg syndrome   . Rib fractures 06/2019   Right  . Right rib fracture 06/04/2019  . Secondary hyperparathyroidism of renal origin ([place]snabrock) 06/04/2017  . SOB (shortness of breath)   . Stroke ([place]cross) 2018  . Symptomatic anemia 05/24/2017  . Tachycardia 01/04/2020  . Thoracic ascending aortic aneurysm (HCC)    4.5 cm 06/04/19 CT  . Ulnar neuropathy 10/05/2018  . Volume overload 10/23/2017    Patient Active Problem List    Diagnosis Date Noted  . Diastolic dysfunction 61/44/3154  . Renovascular hypertension 06/22/2020  . Demand ischemia ([place]) 06/18/2020  . LVH (left ventricular hypertrophy) due to hypertensive disease, with heart failure ([place]) 06/18/2020  . Myofascial pain syndrome 06/12/2020  . Encounter for removal of sutures 03/29/2020  . History of fusion of cervical spine 03/26/2020  . Non-compliance with renal dialysis ([place]) 02/08/2020  . Acute pulmonary edema ([place]ena) 01/23/2020  . Cervical disc herniation 01/04/2020  . Tachycardia 01/04/2020  . SOB (shortness of breath)   . Chronic low back pain 11/24/2019  . Degeneration of lumbar intervertebral disc 11/24/2019  . Lumbar radiculopathy 11/24/2019  . Left shoulder pain 11/11/2019  . Anaphylactic shock, unspecified, sequela 06/10/2019  . Rib fractures 06/05/2019  . Fall at home, initial encounter 06/04/2019  . Right rib fracture 06/04/2019  . Lung contusion 06/04/2019  . Acute respiratory failure with hypoxia ([place]) 06/04/2019  . Anemia in ESRD (end-stage renal disease) ([place]) 06/04/2019  . GERD (gastroesophageal reflux disease) 06/04/2019  . Chronic diastolic (congestive) heart failure ([place]) 06/04/2019  . Fall 06/04/2019  . Thoracic ascending aortic aneurysm ([place]artlett) 06/04/2019  . Acute exacerbation of congestive heart failure ([place]) 05/16/2019  . Carpal tunnel syndrome of right wrist 10/05/2018  . Ulnar neuropathy 10/05/2018  . Neuritis of right ulnar nerve 09/13/2018  . Pain in joint of right elbow 09/13/2018  . Chest pain 04/13/2018  . Chronic diastolic heart failure ([place]) 04/13/2018  . Chest discomfort   . Atypical chest pain 02/16/2018  . ESRD on dialysis ([place]ippey) 02/16/2018  . Volume overload 10/23/2017  . Iron deficiency anemia, unspecified 09/09/2017  . Gram-negative sepsis, unspecified ([place]) 09/05/2017  . Cough 08/23/2017  . Encounter for immunization 07/08/2017  . Moderate protein-calorie malnutrition ([place]om[place]) 06/06/2017  . Coagulation  defect, unspecified (Wilson) 06/04/2017  . Hypokalemia 06/04/2017  . Other hyperlipidemia 06/04/2017  . Secondary hyperparathyroidism of renal origin ([place]ahoka) 06/04/2017  . Constipation   . Macrocytic anemia 05/24/2017  . Hypertensive urgency 05/24/2017  . History of completed stroke   . Essential hypertension 04/09/2015    Past Surgical History:  Procedure Laterality Date  . ANTERIOR CERVICAL DECOMP/DISCECTOMY FUSION N/A 01/04/2020   Procedure: ANTERIOR CERVICAL DECOMPRESSION/DISCECTOMY FUSION CERVICAL FIVE THROUGH SEVEN;  Surgeon: Melina Schools, MD;  Location: [place];  Service: Orthopedics;  Laterality: N/A;  3 hrs  . AV FISTULA PLACEMENT Left 05/28/2017   Procedure: LEFT ARM ARTERIOVENOUS (AV) FISTULA CREATION;  Surgeon: Conrad Merrick, MD;  Location: [facility] [facility];  Service: Vascular;  Laterality: Left;  . EYE SURGERY Right 06/02/2019   Cataract removed  . FRACTURE SURGERY    . IR FLUORO GUIDE CV LINE RIGHT  05/25/2017  . IR US GUIDE VASC ACCESS RIGHT  05/25/2017  . REVISON OF ARTERIOVENOUS FISTULA Left 07/18/2019   Procedure: REVISION PLICATION OF RADIOCEPHALIC ARTERIOVENOUS FISTULA LEFT ARM;  Surgeon: Angelia Mould, MD;  Location: [facility];  Service: Vascular;  Laterality: Left;  . RINOPLASTY         Family History  Problem  Relation Age of Onset  . Cancer Mother     Social History   Tobacco Use  . Smoking status: Never Smoker  . Smokeless tobacco: Never Used  Vaping Use  . Vaping Use: Never used  Substance Use Topics  . Alcohol use: Not Currently    Alcohol/week: 0.0 standard drinks    Comment: has a drink once in a while- 12/30/19  . Drug use: No    Home Medications Prior to Admission medications   Medication Sig Start Date End Date Taking? Authorizing Provider  albuterol (VENTOLIN HFA) 108 (90 Base) MCG/ACT inhaler Inhale 2 puffs into the lungs every 6 (six) hours as needed.    [provider]  amLODipine (NORVASC) 10 MG tablet Take 10 mg by mouth daily.      [provider]  atorvastatin (LIPITOR) 20 MG tablet Take 20 mg by mouth daily. 04/19/20   [provider]  AURYXIA 1 GM 210 MG(Fe) tablet Take 420 mg by mouth 3 (three) times daily. 03/16/20   [provider]  carvedilol (COREG) 12.5 MG tablet Take 1 tablet (12.5 mg total) by mouth 2 (two) times daily with a meal. 05/18/19   Martyn Malay, MD  cinacalcet (SENSIPAR) 60 MG tablet Take 60 mg by mouth daily. 11/11/18   [provider]  furosemide (LASIX) 40 MG tablet Take 40 mg of Lasix on non-dialysis days. Patient taking differently: Take 40 mg by mouth See admin instructions. On Sunday, Monday, Wednesday, Thursday and Saturday 03/21/20   Tobb, Kardie, DO  gabapentin (NEURONTIN) 300 MG capsule Take 300 mg by mouth 2 (two) times daily as needed (pain).     [provider]  hydrALAZINE (APRESOLINE) 100 MG tablet Take 1 tablet (100 mg total) by mouth every 8 (eight) hours. 05/18/19   Martyn Malay, MD  hydrALAZINE (APRESOLINE) 25 MG tablet Take 50 mg by mouth 3 (three) times daily. 06/02/20   [provider]  isosorbide mononitrate (IMDUR) 30 MG 24 hr tablet Take 1 tablet (30 mg total) by mouth daily. 07/15/19 06/06/29  Tobb, Kardie, DO  metoprolol tartrate (LOPRESSOR) 100 MG tablet Take 100 mg by mouth 2 (two) times daily.     [provider]  multivitamin (RENA-VIT) TABS tablet Take 1 tablet by mouth daily. 01/12/18   [provider]  sevelamer carbonate (RENVELA) 800 MG tablet Take 800 mg by mouth daily. 11/07/19   [provider]    Allergies    Patient has no known allergies.  Review of Systems   Review of Systems  Constitutional: Negative for fever.  HENT: Negative for sore throat.   Eyes: Negative for redness.  Respiratory: Negative for cough and shortness of breath.   Cardiovascular: Negative for chest pain.  Gastrointestinal: Negative for abdominal pain, diarrhea and vomiting.  Genitourinary: Negative for flank  pain.  Musculoskeletal: Negative for back pain and neck pain.  Skin: Negative for rash.  Neurological: Negative for headaches.  Hematological: Does not bruise/bleed easily.  Psychiatric/Behavioral: Negative for confusion.    Physical Exam Updated Vital Signs BP 136/61 (BP Location: Right Arm)   Pulse 87   Temp 98.1 F (36.7 C) (Oral)   Resp (!) 22   SpO2 91%   Physical Exam Vitals and nursing note reviewed.  Constitutional:      Appearance: Normal appearance. He is well-developed.  HENT:     Head: Atraumatic.     Nose: Nose normal.     Mouth/Throat:     Mouth:  Mucous membranes are moist.     Pharynx: Oropharynx is clear.  Eyes:     General: No scleral icterus.    Conjunctiva/sclera: Conjunctivae normal.  Neck:     Trachea: No tracheal deviation.  Cardiovascular:     Rate and Rhythm: Normal rate and regular rhythm.     Pulses: Normal pulses.     Heart sounds: Normal heart sounds. No murmur heard.  No friction rub. No gallop.   Pulmonary:     Effort: Pulmonary effort is normal. No accessory muscle usage or respiratory distress.     Breath sounds: Normal breath sounds.  Abdominal:     General: Bowel sounds are normal. There is no distension.     Palpations: Abdomen is soft.     Tenderness: There is no abdominal tenderness.  Genitourinary:    Comments: No cva tenderness. Musculoskeletal:     Cervical back: Normal range of motion and neck supple. No rigidity.     Comments: +moderate bilateral foot/ankle swelling, symmetric.   Skin:    General: Skin is warm and dry.     Findings: No rash.  Neurological:     Mental Status: He is alert.     Comments: Alert, speech clear.   Psychiatric:        Mood and Affect: Mood normal.     ED Results / Procedures / Treatments   Labs (all labs ordered are listed, but only abnormal results are displayed) Results for orders placed or performed during the hospital encounter of 07/10/20  Comprehensive metabolic panel  Result  Value Ref Range   Sodium 142 135 - 145 mmol/L   Potassium 5.1 3.5 - 5.1 mmol/L   Chloride 102 98 - 111 mmol/L   CO2 17 (L) 22 - 32 mmol/L   Glucose, Bld 110 (H) 70 - 99 mg/dL   BUN 95 (H) 8 - 23 mg/dL   Creatinine, Ser 12.20 (H) 0.61 - 1.24 mg/dL   Calcium 8.5 (L) 8.9 - 10.3 mg/dL   Total Protein 6.3 (L) 6.5 - 8.1 g/dL   Albumin 3.6 3.5 - 5.0 g/dL   AST 8 (L) 15 - 41 U/L   ALT 11 0 - 44 U/L   Alkaline Phosphatase 74 38 - 126 U/L   Total Bilirubin 0.7 0.3 - 1.2 mg/dL   GFR, Estimated 4 (L) >60 mL/min   Anion gap 23 (H) 5 - 15  CBC with Differential  Result Value Ref Range   WBC 4.7 4.0 - 10.5 K/uL   RBC 2.09 (L) 4.22 - 5.81 MIL/uL   Hemoglobin 7.3 (L) 13.0 - 17.0 g/dL   HCT 25.0 (L) 39 - 52 %   MCV 119.6 (H) 80.0 - 100.0 fL   MCH 34.9 (H) 26.0 - 34.0 pg   MCHC 29.2 (L) 30.0 - 36.0 g/dL   RDW 14.1 11.5 - 15.5 %   Platelets 153 150 - 400 K/uL   nRBC 0.0 0.0 - 0.2 %   Neutrophils Relative % 79 %   Neutro Abs 3.7 1.7 - 7.7 K/uL   Lymphocytes Relative 8 %   Lymphs Abs 0.4 (L) 0.7 - 4.0 K/uL   Monocytes Relative 4 %   Monocytes Absolute 0.2 0.1 - 1.0 K/uL   Eosinophils Relative 6 %   Eosinophils Absolute 0.3 0.0 - 0.5 K/uL   Basophils Relative 3 %   Basophils Absolute 0.1 0.0 - 0.1 K/uL   nRBC 0 0 /100 WBC   Abs Immature Granulocytes 0.00 0.00 -  0.07 K/uL   Polychromasia PRESENT      EKG None  Radiology No results found.  Procedures Procedures (including critical care time)  Medications Ordered in ED Medications - No data to display  ED Course  I have reviewed the triage vital signs and the nursing notes.  Pertinent labs & imaging results that were available during my care of the patient were reviewed by me and considered in my medical decision making (see chart for details).    MDM Rules/Calculators/A&P                          Stat labs.   Reviewed nursing notes and prior charts for additional history.  Recent ED visit for same reviewed.   Labs  reviewed/interpreted by me - k 5.1, upper limits normal.   Chronic anemia..  Nephrology consulted - discussed pt, will arrange dialysis for patient.   0857 - discussed with renal on call - they will arrange hd today as soon as able. Also discussed that continued dialysis via ED is not good long term plan for patient (despite threat made at prior site) - they indicate their staff is working on that issue as well.    Final Clinical Impression(s) / ED Diagnoses Final diagnoses:  None    Rx / DC Orders ED Discharge Orders    None       Lajean Saver, MD 07/10/20 1443    Lajean Saver, MD 07/10/20 [phone]

## 2020-07-10 NOTE — ED Triage Notes (Signed)
Pt presents stating he needs dialysis, last session was 1 week ago, he reports he has not been able to be placed at a dialysis center thus far so he comes here for his treatment. Denies any swelling, reports he wears 2-3 L O2 at home as needed.

## 2020-07-12 ENCOUNTER — Telehealth: Payer: Self-pay

## 2020-07-12 NOTE — Telephone Encounter (Signed)
   Primary Cardiologist: [name], DO  Chart reviewed as part of pre-operative protocol coverage. Thomas Robinson has had several ED visits since last seen by cardiology at an outpatient visit with Dr. Harriet Masson 06/27/20. As such, he will require a follow-up visit in order to better assess preoperative cardiovascular risk.  Pre-op covering staff: - Please schedule appointment and call patient to inform them. Please add "pre-op clearance" to the appointment notes so provider is aware. - Please contact requesting surgeon's office via preferred method (i.e, phone, fax) to inform them of need for appointment prior to surgery.   Abigail Butts, PA-C  07/12/2020, 2:03 PM

## 2020-07-12 NOTE — Telephone Encounter (Signed)
Will send message to [facility] office to assist in scheduling a pre op appt. I do not see any available appts.

## 2020-07-12 NOTE — Telephone Encounter (Signed)
   [facility] Pre-operative Risk Assessment    HEARTCARE STAFF: - Please ensure there is not already an duplicate clearance open for this procedure. - Under Visit Info/Reason for Call, type in Other and utilize the format Clearance MM/DD/YY or Clearance TBD. Do not use dashes or single digits. - If request is for dental extraction, please clarify the # of teeth to be extracted.  Request for surgical clearance:  1. What type of surgery is being performed? Lumbar decompression   2. When is this surgery scheduled? TBD   3. What type of clearance is required (medical clearance vs. Pharmacy clearance to hold med vs. Both)? medical  4. Are there any medications that need to be held prior to surgery and how long?NA   5. Practice name and name of physician performing surgery? [facility] Dr. Susa Day   6. What is the office phone number? [phone]   7.   What is the office fax number? [phone]  8.   Anesthesia type (None, local, MAC, general) ? general   Thomas Robinson 07/12/2020, 8:53 AM  _________________________________________________________________   (provider comments below)

## 2020-07-16 ENCOUNTER — Other Ambulatory Visit: Payer: Self-pay

## 2020-07-16 ENCOUNTER — Emergency Department (HOSPITAL_COMMUNITY): Payer: Medicare Other

## 2020-07-16 ENCOUNTER — Encounter (HOSPITAL_COMMUNITY): Payer: Self-pay

## 2020-07-16 ENCOUNTER — Emergency Department (HOSPITAL_COMMUNITY)
Admission: EM | Admit: 2020-07-16 | Discharge: 2020-07-16 | Disposition: A | Discharge status: Home / Self Care | Payer: Medicare Other | Attending: Emergency Medicine | Admitting: Emergency Medicine

## 2020-07-16 DIAGNOSIS — Z992 Dependence on renal dialysis: Secondary | ICD-10-CM | POA: Insufficient documentation

## 2020-07-16 DIAGNOSIS — I132 Hypertensive heart and chronic kidney disease with heart failure and with stage 5 chronic kidney disease, or end stage renal disease: Secondary | ICD-10-CM | POA: Diagnosis not present

## 2020-07-16 DIAGNOSIS — I5032 Chronic diastolic (congestive) heart failure: Secondary | ICD-10-CM | POA: Insufficient documentation

## 2020-07-16 DIAGNOSIS — R2243 Localized swelling, mass and lump, lower limb, bilateral: Secondary | ICD-10-CM | POA: Diagnosis not present

## 2020-07-16 DIAGNOSIS — E875 Hyperkalemia: Secondary | ICD-10-CM | POA: Insufficient documentation

## 2020-07-16 DIAGNOSIS — N186 End stage renal disease: Secondary | ICD-10-CM | POA: Insufficient documentation

## 2020-07-16 DIAGNOSIS — Z79899 Other long term (current) drug therapy: Secondary | ICD-10-CM | POA: Insufficient documentation

## 2020-07-16 DIAGNOSIS — R0602 Shortness of breath: Secondary | ICD-10-CM | POA: Diagnosis present

## 2020-07-16 DIAGNOSIS — Z20822 Contact with and (suspected) exposure to covid-19: Secondary | ICD-10-CM | POA: Diagnosis not present

## 2020-07-16 LAB — ALBUMIN: Albumin: 3.9 g/dL (ref 3.5–5.0)

## 2020-07-16 LAB — BASIC METABOLIC PANEL
Anion gap: 21 — ABNORMAL HIGH (ref 5–15)
BUN: 92 mg/dL — ABNORMAL HIGH (ref 8–23)
CO2: 18 mmol/L — ABNORMAL LOW (ref 22–32)
Calcium: 8.7 mg/dL — ABNORMAL LOW (ref 8.9–10.3)
Chloride: 102 mmol/L (ref 98–111)
Creatinine, Ser: 11.99 mg/dL — ABNORMAL HIGH (ref 0.61–1.24)
GFR, Estimated: 4 mL/min — ABNORMAL LOW (ref >60)
Glucose, Bld: 101 mg/dL — ABNORMAL HIGH (ref 70–99)
Potassium: 5.7 mmol/L — ABNORMAL HIGH (ref 3.5–5.1)
Sodium: 141 mmol/L (ref 135–145)

## 2020-07-16 LAB — RESPIRATORY PANEL BY RT PCR (FLU A&B, COVID)
Influenza A by PCR: NEGATIVE
Influenza B by PCR: NEGATIVE
SARS Coronavirus 2 by RT PCR: NEGATIVE

## 2020-07-16 LAB — CBC WITH DIFFERENTIAL/PLATELET
Abs Immature Granulocytes: 0.02 10*3/uL (ref 0.00–0.07)
Basophils Absolute: 0.1 10*3/uL (ref 0.0–0.1)
Basophils Relative: 1 %
Eosinophils Absolute: 0.3 10*3/uL (ref 0.0–0.5)
Eosinophils Relative: 6 %
HCT: 26.1 % — ABNORMAL LOW (ref 39.0–52.0)
Hemoglobin: 8.1 g/dL — ABNORMAL LOW (ref 13.0–17.0)
Immature Granulocytes: 0 %
Lymphocytes Relative: 7 %
Lymphs Abs: 0.4 10*3/uL — ABNORMAL LOW (ref 0.7–4.0)
MCH: 35.2 pg — ABNORMAL HIGH (ref 26.0–34.0)
MCHC: 31 g/dL (ref 30.0–36.0)
MCV: 113.5 fL — ABNORMAL HIGH (ref 80.0–100.0)
Monocytes Absolute: 0.4 10*3/uL (ref 0.1–1.0)
Monocytes Relative: 7 %
Neutro Abs: 4.3 10*3/uL (ref 1.7–7.7)
Neutrophils Relative %: 79 %
Platelets: 134 10*3/uL — ABNORMAL LOW (ref 150–400)
RBC: 2.3 MIL/uL — ABNORMAL LOW (ref 4.22–5.81)
RDW: 14.7 % (ref 11.5–15.5)
WBC: 5.5 10*3/uL (ref 4.0–10.5)
nRBC: 0 % (ref 0.0–0.2)

## 2020-07-16 LAB — PHOSPHORUS: Phosphorus: 8.6 mg/dL — ABNORMAL HIGH (ref 2.5–4.6)

## 2020-07-16 MED ORDER — DARBEPOETIN ALFA 200 MCG/0.4ML IJ SOSY
200.0000 ug | PREFILLED_SYRINGE | Freq: Once | INTRAMUSCULAR | Status: DC
  Filled 2020-07-16: qty 0.4

## 2020-07-16 MED ORDER — ALTEPLASE 2 MG IJ SOLR: 2.0000 mg | Freq: Once | INTRAMUSCULAR | Status: DC | PRN

## 2020-07-16 MED ORDER — HEPARIN SODIUM (PORCINE) 1000 UNIT/ML DIALYSIS
1000.0000 [IU] | INTRAMUSCULAR | Status: DC | PRN
  Filled 2020-07-16: qty 1

## 2020-07-16 MED ORDER — SODIUM CHLORIDE 0.9 % IV SOLN: 100.0000 mL | INTRAVENOUS | Status: DC | PRN

## 2020-07-16 MED ORDER — SODIUM ZIRCONIUM CYCLOSILICATE 5 G PO PACK
5.0000 g | PACK | Freq: Once | ORAL | Status: AC
  Administered 2020-07-16: 5 g via ORAL
  Filled 2020-07-16: qty 1

## 2020-07-16 MED ORDER — LIDOCAINE HCL (PF) 1 % IJ SOLN: 5.0000 mL | INTRAMUSCULAR | Status: DC | PRN

## 2020-07-16 MED ORDER — PENTAFLUOROPROP-TETRAFLUOROETH EX AERO
1.0000 "application " | INHALATION_SPRAY | CUTANEOUS | Status: DC | PRN
  Filled 2020-07-16: qty 116

## 2020-07-16 MED ORDER — CHLORHEXIDINE GLUCONATE CLOTH 2 % EX PADS: 6.0000 | MEDICATED_PAD | Freq: Every day | CUTANEOUS | Status: DC

## 2020-07-16 MED ORDER — LIDOCAINE-PRILOCAINE 2.5-2.5 % EX CREA
1.0000 "application " | TOPICAL_CREAM | CUTANEOUS | Status: DC | PRN
  Filled 2020-07-16: qty 5

## 2020-07-16 MED ORDER — ALBUTEROL SULFATE HFA 108 (90 BASE) MCG/ACT IN AERS
2.0000 | INHALATION_SPRAY | Freq: Once | RESPIRATORY_TRACT | Status: AC
  Administered 2020-07-16: 2 via RESPIRATORY_TRACT
  Filled 2020-07-16: qty 6.7

## 2020-07-16 NOTE — ED Provider Notes (Signed)
Patient here with a history of end-stage renal disease, no "steady dialysis center" who presented last night with shortness of breath.  Found to be mildly hyperkalemic and slightly volume overloaded.  He completed dialysis here in the hospital.  He states that he "feels like him 100 bucks."  He appears to be breathing easily and will be discharged at this time.   Margarita Mail, PA-C 07/16/20 1031    Gareth Morgan, MD 07/17/20 0140

## 2020-07-16 NOTE — ED Notes (Signed)
Patient returned from the HD treatment. He is alert, smiling, and oriented. He reports he feels much better. Patient ambulating around nursing station with steady even gait. Reminded several times to return to his bed area. DC pending. Provider aware that he is ready to go.

## 2020-07-16 NOTE — Discharge Instructions (Signed)
Contact a health care provider if you: Have an irregular or very slow heartbeat. Feel light-headed. Feel weak. Are nauseous. Have tingling or numbness in your hands or feet. Get help right away if you: Have shortness of breath. Have chest pain or discomfort. Pass out. Have muscle paralysis.

## 2020-07-16 NOTE — Telephone Encounter (Signed)
Pt has been scheduled to see Dr. Harriet Masson 07/30/20 for pre op assessment. Will send notes to Dr. Harriet Masson for upcoming appt. Will send FYI to requesting office pt has appt 07/30/20. Will remove from the pre op call back pool.

## 2020-07-16 NOTE — ED Notes (Signed)
Consult with Pharm. RN in dialysis to give Aranesp.

## 2020-07-16 NOTE — ED Notes (Signed)
Lab to add renal

## 2020-07-16 NOTE — ED Triage Notes (Signed)
Pt BIB [facility] from home with Dry Cough, SOB, and swelling to extremities.   Pt does receive Dialysis (T, R, Sat). Last Dialysis 07/10/20.  Pt currently in process of obtaining more steady Dialysis Center(s).   GCS 15, A&Ox4.   EMS VS Pt Hypertensive (169C systolic) Hypoxic at baseline 2.5L [place] in 80s SPO2.  Pt placed on Simple Mask 5L.  Other VSS

## 2020-07-16 NOTE — Procedures (Signed)
Pt seen in HD, pt is stable w/o resp distress.  CXR clear. Pt will return to ED after HD for assessment prior to dc.    I was present at this dialysis session, have reviewed the session itself and made  appropriate changes [name] MD [place] pager [phone]   07/16/2020, 7:54 AM

## 2020-07-16 NOTE — ED Provider Notes (Signed)
[facility] EMERGENCY DEPARTMENT Provider Note   CSN: [id number] Arrival date & time: 07/16/20  0041     History Chief Complaint  Patient presents with  . Shortness of Breath  . Leg Swelling    Mackey Varricchio is a 59 y.o. male.  The history is provided by the patient.  Shortness of Breath Severity:  Moderate Onset quality:  Gradual Timing:  Intermittent Progression:  Worsening Chronicity:  Recurrent Relieved by:  Nothing Worsened by:  Nothing Associated symptoms: cough   Associated symptoms: no chest pain and no fever   Patient with history of end-stage renal disease, asthma presents with shortness of breath.  Patient reports he does not have a steady dialysis center.  He reports his last dialysis was on November 9.  He reports he is having increasing cough and shortness of breath.  Also reports lower extremity swelling.  No chest pain or fevers are reported.  This feels similar to prior episodes when he requires dialysis     Past Medical History:  Diagnosis Date  . Acute exacerbation of congestive heart failure ([place]) 05/16/2019  . Acute kidney injury superimposed on CKD ([place]) 05/24/2017  . Acute on chronic diastolic CHF (congestive heart failure) ([place]) 04/13/2018  . Acute pulmonary edema ([place]) 01/23/2020  . Acute respiratory failure with hypoxia ([place]) 06/04/2019  . AKI (acute kidney injury) ([place]) 04/10/2015  . Anaphylactic shock, unspecified, sequela 06/10/2019  . Arthritis   . Asthma   . Asthma, chronic, unspecified asthma severity, with acute exacerbation 10/23/2017  . Atypical chest pain 02/16/2018  . CAP (community acquired pneumonia) 10/23/2017  . Carpal tunnel syndrome of right wrist 10/05/2018  . Cataract    right - removed by surgery  . Cerebral infarction due to thrombosis of cerebral artery ([place])   . Cerebrovascular accident (CVA) ([place])   . Cervical disc herniation 01/04/2020  . Chest discomfort   . Chest pain 04/13/2018  . Chronic diastolic  (congestive) heart failure ([place]) 06/04/2019  . Chronic diastolic heart failure ([place]) 04/13/2018  . Chronic kidney disease   . Chronic low back pain 11/24/2019  . CKD (chronic kidney disease), stage V ([place]) 05/24/2017  . Constipation   . Cough    chronic cough  . Demand ischemia ([place]) 06/18/2020  . Diastolic dysfunction 09/32/3557  . Dyspnea    "all the time" per patient at PAT 12/30/19  RA sats 87%, no oxygen use  . Encounter for immunization 07/08/2017  . Encounter for removal of sutures 03/29/2020  . ESRD (end stage renal disease) on dialysis ([place]) 02/16/2018   Dialysis  T Thus Sat  - [facility]   . Essential hypertension 04/09/2015  . Fall 06/04/2019  . Fall at home, initial encounter 06/04/2019  . GERD (gastroesophageal reflux disease) 06/04/2019  . GI bleeding 05/24/2017  . Gram-negative sepsis, unspecified ([place]) 09/05/2017  . History of completed stroke 2018  . History of fusion of cervical spine 03/26/2020  . Hyperlipidemia   . Hypertension   . Hypertensive urgency 05/24/2017  . Hypokalemia 06/04/2017  . Iron deficiency anemia, unspecified 09/09/2017  . Kidney failure   . Left shoulder pain 11/11/2019  . Lumbar radiculopathy 11/24/2019  . Lung contusion 06/04/2019  . LVH (left ventricular hypertrophy) due to hypertensive disease, with heart failure ([place]) 06/18/2020  . Macrocytic anemia 05/24/2017  . Moderate protein-calorie malnutrition ([place]) 06/06/2017  . Myofascial pain syndrome 06/12/2020  . Neuritis of right ulnar nerve 09/13/2018  . Non-compliance with renal dialysis ([place])  02/08/2020  . Other hyperlipidemia 06/04/2017  . Oxygen deficiency 12/30/2019   O2 sats on RA 87% at PAT appt   . Pain in joint of right elbow 09/13/2018  . Renovascular hypertension 06/22/2020  . Respiratory failure, acute ([place]) 10/23/2017  . Restless leg syndrome   . Rib fractures 06/2019   Right  . Right rib fracture 06/04/2019  . Secondary hyperparathyroidism of renal origin ([place]) 06/04/2017  . SOB  (shortness of breath)   . Stroke ([place]) 2018  . Symptomatic anemia 05/24/2017  . Tachycardia 01/04/2020  . Thoracic ascending aortic aneurysm ([facility])    4.5 cm 06/04/19 CT  . Ulnar neuropathy 10/05/2018  . Volume overload 10/23/2017    Patient Active Problem List   Diagnosis Date Noted  . Diastolic dysfunction 56/38/7564  . Renovascular hypertension 06/22/2020  . Demand ischemia ([place]) 06/18/2020  . LVH (left ventricular hypertrophy) due to hypertensive disease, with heart failure ([place]) 06/18/2020  . Myofascial pain syndrome 06/12/2020  . Encounter for removal of sutures 03/29/2020  . History of fusion of cervical spine 03/26/2020  . Non-compliance with renal dialysis ([place]) 02/08/2020  . Acute pulmonary edema ([place]) 01/23/2020  . Cervical disc herniation 01/04/2020  . Tachycardia 01/04/2020  . SOB (shortness of breath)   . Chronic low back pain 11/24/2019  . Degeneration of lumbar intervertebral disc 11/24/2019  . Lumbar radiculopathy 11/24/2019  . Left shoulder pain 11/11/2019  . Anaphylactic shock, unspecified, sequela 06/10/2019  . Rib fractures 06/05/2019  . Fall at home, initial encounter 06/04/2019  . Right rib fracture 06/04/2019  . Lung contusion 06/04/2019  . Acute respiratory failure with hypoxia ([place]) 06/04/2019  . Anemia in ESRD (end-stage renal disease) ([place]) 06/04/2019  . GERD (gastroesophageal reflux disease) 06/04/2019  . Chronic diastolic (congestive) heart failure ([place]) 06/04/2019  . Fall 06/04/2019  . Thoracic ascending aortic aneurysm ([place]) 06/04/2019  . Acute exacerbation of congestive heart failure ([place]) 05/16/2019  . Carpal tunnel syndrome of right wrist 10/05/2018  . Ulnar neuropathy 10/05/2018  . Neuritis of right ulnar nerve 09/13/2018  . Pain in joint of right elbow 09/13/2018  . Chest pain 04/13/2018  . Chronic diastolic heart failure ([place]) 04/13/2018  . Chest discomfort   . Atypical chest pain 02/16/2018  . ESRD on dialysis ([place]) 02/16/2018  . Volume  overload 10/23/2017  . Iron deficiency anemia, unspecified 09/09/2017  . Gram-negative sepsis, unspecified ([place]) 09/05/2017  . Cough 08/23/2017  . Encounter for immunization 07/08/2017  . Moderate protein-calorie malnutrition ([place]) 06/06/2017  . Coagulation defect, unspecified ([place]) 06/04/2017  . Hypokalemia 06/04/2017  . Other hyperlipidemia 06/04/2017  . Secondary hyperparathyroidism of renal origin ([place]) 06/04/2017  . Constipation   . Macrocytic anemia 05/24/2017  . Hypertensive urgency 05/24/2017  . History of completed stroke   . Essential hypertension 04/09/2015    Past Surgical History:  Procedure Laterality Date  . ANTERIOR CERVICAL DECOMP/DISCECTOMY FUSION N/A 01/04/2020   Procedure: ANTERIOR CERVICAL DECOMPRESSION/DISCECTOMY FUSION CERVICAL FIVE THROUGH SEVEN;  Surgeon: Melina Schools, MD;  Location: [facility];  Service: Orthopedics;  Laterality: N/A;  3 hrs  . AV FISTULA PLACEMENT Left 05/28/2017   Procedure: LEFT ARM ARTERIOVENOUS (AV) FISTULA CREATION;  Surgeon: Conrad Henriette, MD;  Location: [facility];  Service: Vascular;  Laterality: Left;  . EYE SURGERY Right 06/02/2019   Cataract removed  . FRACTURE SURGERY    . IR FLUORO GUIDE CV LINE RIGHT  05/25/2017  . IR US GUIDE VASC ACCESS RIGHT  05/25/2017  . REVISON OF ARTERIOVENOUS  FISTULA Left 07/18/2019   Procedure: REVISION PLICATION OF RADIOCEPHALIC ARTERIOVENOUS FISTULA LEFT ARM;  Surgeon: Angelia Mould, MD;  Location: [facility];  Service: Vascular;  Laterality: Left;  . RINOPLASTY         Family History  Problem Relation Age of Onset  . Cancer Mother     Social History   Tobacco Use  . Smoking status: Never Smoker  . Smokeless tobacco: Never Used  Vaping Use  . Vaping Use: Never used  Substance Use Topics  . Alcohol use: Not Currently    Alcohol/week: 0.0 standard drinks    Comment: has a drink once in a while- 12/30/19  . Drug use: No    Home Medications Prior to Admission medications   Medication Sig  Start Date End Date Taking? Authorizing Provider  albuterol (VENTOLIN HFA) 108 (90 Base) MCG/ACT inhaler Inhale 2 puffs into the lungs every 6 (six) hours as needed.    [provider]  amLODipine (NORVASC) 10 MG tablet Take 10 mg by mouth daily.     [provider]  atorvastatin (LIPITOR) 20 MG tablet Take 20 mg by mouth daily. 04/19/20   [provider]  AURYXIA 1 GM 210 MG(Fe) tablet Take 420 mg by mouth 3 (three) times daily. 03/16/20   [provider]  carvedilol (COREG) 12.5 MG tablet Take 1 tablet (12.5 mg total) by mouth 2 (two) times daily with a meal. 05/18/19   Martyn Malay, MD  cinacalcet (SENSIPAR) 60 MG tablet Take 60 mg by mouth daily. 11/11/18   [provider]  furosemide (LASIX) 40 MG tablet Take 40 mg of Lasix on non-dialysis days. Patient taking differently: Take 40 mg by mouth See admin instructions. On Sunday, Monday, Wednesday, Thursday and Saturday 03/21/20   Tobb, Kardie, DO  gabapentin (NEURONTIN) 300 MG capsule Take 300 mg by mouth 2 (two) times daily as needed (pain).     [provider]  hydrALAZINE (APRESOLINE) 100 MG tablet Take 1 tablet (100 mg total) by mouth every 8 (eight) hours. 05/18/19   Martyn Malay, MD  hydrALAZINE (APRESOLINE) 25 MG tablet Take 50 mg by mouth 3 (three) times daily. 06/02/20   [provider]  isosorbide mononitrate (IMDUR) 30 MG 24 hr tablet Take 1 tablet (30 mg total) by mouth daily. 07/15/19 06/06/29  Tobb, Kardie, DO  metoprolol tartrate (LOPRESSOR) 100 MG tablet Take 100 mg by mouth 2 (two) times daily.     [provider]  multivitamin (RENA-VIT) TABS tablet Take 1 tablet by mouth daily. 01/12/18   [provider]  sevelamer carbonate (RENVELA) 800 MG tablet Take 800 mg by mouth daily. 11/07/19   [provider]    Allergies    Patient has no known allergies.  Review of Systems   Review of Systems  Constitutional: Negative for fever.  Respiratory:  Positive for cough and shortness of breath.   Cardiovascular: Positive for leg swelling. Negative for chest pain.  All other systems reviewed and are negative.   Physical Exam Updated Vital Signs BP (!) 160/83 (BP Location: Right Arm)   Pulse 76   Temp 98.6 F (37 C) (Oral)   Resp 20   Ht 1.727 m (5\' 8" )   Wt 61.2 kg   SpO2 100%   BMI 20.53 kg/m   Physical Exam  CONSTITUTIONAL: Chronically ill-appearing HEAD: Normocephalic/atraumatic EYES: EOMI/PERRL ENMT: Mucous membranes moist NECK: supple no meningeal signs SPINE/BACK:entire spine nontender CV: S1/S2 noted LUNGS: Mild tachypnea, decreased  breath sounds bilaterally ABDOMEN: soft, nontender, no rebound or guarding, bowel sounds noted throughout abdomen GU:no cva tenderness NEURO: Pt is awake/alert/appropriate, moves all extremitiesx4.  No facial droop.   EXTREMITIES: pulses normal/equal, full ROM, lower extremity edema noted SKIN: warm, color normal PSYCH: no abnormalities of mood noted, alert and oriented to situation  ED Results / Procedures / Treatments   Labs (all labs ordered are listed, but only abnormal results are displayed) Labs Reviewed  BASIC METABOLIC PANEL - Abnormal; Notable for the following components:      Result Value   Potassium 5.7 (*)    CO2 18 (*)    Glucose, Bld 101 (*)    BUN 92 (*)    Creatinine, Ser 11.99 (*)    Calcium 8.7 (*)    GFR, Estimated 4 (*)    Anion gap 21 (*)    All other components within normal limits  CBC WITH DIFFERENTIAL/PLATELET - Abnormal; Notable for the following components:   RBC 2.30 (*)    Hemoglobin 8.1 (*)    HCT 26.1 (*)    MCV 113.5 (*)    MCH 35.2 (*)    Platelets 134 (*)    Lymphs Abs 0.4 (*)    All other components within normal limits  RESPIRATORY PANEL BY RT PCR (FLU A&B, COVID)    EKG EKG Interpretation  Date/Time:  Monday July 16 2020 00:48:53 EST Ventricular Rate:  74 PR Interval:    QRS Duration: 90 QT Interval:  446 QTC  Calculation: 495 R Axis:   65 Text Interpretation: Sinus rhythm Probable left atrial enlargement Left ventricular hypertrophy Borderline prolonged QT interval Confirmed by Ripley Fraise [phone]) on 07/16/2020 12:58:36 AM   Radiology DG Chest Port 1 View  Result Date: 07/16/2020 CLINICAL DATA:  Shortness of breath EXAM: PORTABLE CHEST 1 VIEW COMPARISON:  07/03/2020 FINDINGS: The heart size and mediastinal contours are within normal limits. Both lungs are clear. The visualized skeletal structures are unremarkable. IMPRESSION: No active disease. Electronically Signed   By: Ulyses Jarred M.D.   On: 07/16/2020 01:29    Procedures Procedures  Medications Ordered in ED Medications  sodium zirconium cyclosilicate (LOKELMA) packet 5 g (has no administration in time range)  Chlorhexidine Gluconate Cloth 2 % PADS 6 each (has no administration in time range)  Darbepoetin Alfa (ARANESP) injection 200 mcg (has no administration in time range)  albuterol (VENTOLIN HFA) 108 (90 Base) MCG/ACT inhaler 2 puff (2 puffs Inhalation Given 07/16/20 0111)    ED Course  I have reviewed the triage vital signs and the nursing notes.  Pertinent labs & imaging results that were available during my care of the patient were reviewed by me and considered in my medical decision making (see chart for details).    MDM Rules/Calculators/A&P                         2:36 AM Patient presents with increasing shortness of breath since he has not had dialysis since November 9. X-ray overall reassuring. Patient with chronic anemia.  He does have mild hyperkalemia.  No acute EKG changes when compared to prior Discussed the case with Dr. Augustin Coupe with nephrology He requests 1 dose of Lokelma.  He will arrange for dialysis later in the morning approximately 7 AM Patient updated on plan.  He is in no acute distress.  Final Clinical Impression(s) / ED Diagnoses Final diagnoses:  ESRD (end stage renal disease) ([place])    Hyperkalemia  Rx / DC Orders ED Discharge Orders    None       Ripley Fraise, MD 07/16/20 [phone]

## 2020-07-16 NOTE — ED Notes (Signed)
Pt refusing PIV Insertion at this time. This RN made this pt aware that if he is admitted or requires IV Medications/Treatment, an IV will need to be inserted. Pt understanding and still refusing PIV Insertion. RN will continue to monitor.

## 2020-07-24 ENCOUNTER — Other Ambulatory Visit: Payer: Self-pay

## 2020-07-24 DIAGNOSIS — H269 Unspecified cataract: Secondary | ICD-10-CM | POA: Insufficient documentation

## 2020-07-24 DIAGNOSIS — Z8673 Personal history of transient ischemic attack (TIA), and cerebral infarction without residual deficits: Secondary | ICD-10-CM | POA: Insufficient documentation

## 2020-07-24 DIAGNOSIS — N189 Chronic kidney disease, unspecified: Secondary | ICD-10-CM | POA: Insufficient documentation

## 2020-07-24 DIAGNOSIS — J45909 Unspecified asthma, uncomplicated: Secondary | ICD-10-CM | POA: Insufficient documentation

## 2020-07-24 DIAGNOSIS — I6330 Cerebral infarction due to thrombosis of unspecified cerebral artery: Secondary | ICD-10-CM | POA: Insufficient documentation

## 2020-07-24 DIAGNOSIS — G2581 Restless legs syndrome: Secondary | ICD-10-CM | POA: Insufficient documentation

## 2020-07-24 DIAGNOSIS — I639 Cerebral infarction, unspecified: Secondary | ICD-10-CM | POA: Insufficient documentation

## 2020-07-24 DIAGNOSIS — I10 Essential (primary) hypertension: Secondary | ICD-10-CM | POA: Insufficient documentation

## 2020-07-24 DIAGNOSIS — M1990 Unspecified osteoarthritis, unspecified site: Secondary | ICD-10-CM | POA: Insufficient documentation

## 2020-07-24 DIAGNOSIS — R0600 Dyspnea, unspecified: Secondary | ICD-10-CM | POA: Insufficient documentation

## 2020-07-24 DIAGNOSIS — N19 Unspecified kidney failure: Secondary | ICD-10-CM | POA: Insufficient documentation

## 2020-07-24 DIAGNOSIS — E785 Hyperlipidemia, unspecified: Secondary | ICD-10-CM | POA: Insufficient documentation

## 2020-07-25 ENCOUNTER — Emergency Department (HOSPITAL_COMMUNITY): Payer: Medicare Other

## 2020-07-25 ENCOUNTER — Encounter (HOSPITAL_COMMUNITY): Payer: Self-pay | Admitting: Emergency Medicine

## 2020-07-25 ENCOUNTER — Emergency Department (HOSPITAL_COMMUNITY)
Admission: EM | Admit: 2020-07-25 | Discharge: 2020-07-25 | Disposition: A | Discharge status: Home / Self Care | Payer: Medicare Other | Attending: Emergency Medicine | Admitting: Emergency Medicine

## 2020-07-25 DIAGNOSIS — Z79899 Other long term (current) drug therapy: Secondary | ICD-10-CM | POA: Diagnosis not present

## 2020-07-25 DIAGNOSIS — R059 Cough, unspecified: Secondary | ICD-10-CM | POA: Insufficient documentation

## 2020-07-25 DIAGNOSIS — R062 Wheezing: Secondary | ICD-10-CM | POA: Insufficient documentation

## 2020-07-25 DIAGNOSIS — N186 End stage renal disease: Secondary | ICD-10-CM | POA: Diagnosis not present

## 2020-07-25 DIAGNOSIS — Z20822 Contact with and (suspected) exposure to covid-19: Secondary | ICD-10-CM | POA: Insufficient documentation

## 2020-07-25 DIAGNOSIS — R600 Localized edema: Secondary | ICD-10-CM | POA: Diagnosis not present

## 2020-07-25 DIAGNOSIS — Z992 Dependence on renal dialysis: Secondary | ICD-10-CM | POA: Diagnosis not present

## 2020-07-25 DIAGNOSIS — I5032 Chronic diastolic (congestive) heart failure: Secondary | ICD-10-CM | POA: Diagnosis not present

## 2020-07-25 DIAGNOSIS — I132 Hypertensive heart and chronic kidney disease with heart failure and with stage 5 chronic kidney disease, or end stage renal disease: Secondary | ICD-10-CM | POA: Diagnosis not present

## 2020-07-25 DIAGNOSIS — R0789 Other chest pain: Secondary | ICD-10-CM | POA: Diagnosis not present

## 2020-07-25 DIAGNOSIS — R0602 Shortness of breath: Secondary | ICD-10-CM | POA: Diagnosis present

## 2020-07-25 LAB — BASIC METABOLIC PANEL
Anion gap: 23 — ABNORMAL HIGH (ref 5–15)
BUN: 107 mg/dL — ABNORMAL HIGH (ref 8–23)
CO2: 17 mmol/L — ABNORMAL LOW (ref 22–32)
Calcium: 9 mg/dL (ref 8.9–10.3)
Chloride: 103 mmol/L (ref 98–111)
Creatinine, Ser: 13.33 mg/dL — ABNORMAL HIGH (ref 0.61–1.24)
GFR, Estimated: 4 mL/min — ABNORMAL LOW (ref >60)
Glucose, Bld: 99 mg/dL (ref 70–99)
Potassium: 6.1 mmol/L — ABNORMAL HIGH (ref 3.5–5.1)
Sodium: 143 mmol/L (ref 135–145)

## 2020-07-25 LAB — CBC
HCT: 22.6 % — ABNORMAL LOW (ref 39.0–52.0)
Hemoglobin: 7.2 g/dL — ABNORMAL LOW (ref 13.0–17.0)
MCH: 35 pg — ABNORMAL HIGH (ref 26.0–34.0)
MCHC: 31.9 g/dL (ref 30.0–36.0)
MCV: 109.7 fL — ABNORMAL HIGH (ref 80.0–100.0)
Platelets: 136 10*3/uL — ABNORMAL LOW (ref 150–400)
RBC: 2.06 MIL/uL — ABNORMAL LOW (ref 4.22–5.81)
RDW: 13.2 % (ref 11.5–15.5)
WBC: 4.6 10*3/uL (ref 4.0–10.5)
nRBC: 0 % (ref 0.0–0.2)

## 2020-07-25 LAB — RESPIRATORY PANEL BY RT PCR (FLU A&B, COVID)
Influenza A by PCR: NEGATIVE
Influenza B by PCR: NEGATIVE
SARS Coronavirus 2 by RT PCR: NEGATIVE

## 2020-07-25 MED ORDER — ALTEPLASE 2 MG IJ SOLR: 2.0000 mg | Freq: Once | INTRAMUSCULAR | Status: DC | PRN

## 2020-07-25 MED ORDER — PENTAFLUOROPROP-TETRAFLUOROETH EX AERO
1.0000 "application " | INHALATION_SPRAY | CUTANEOUS | Status: DC | PRN
  Filled 2020-07-25: qty 116

## 2020-07-25 MED ORDER — HEPARIN SODIUM (PORCINE) 1000 UNIT/ML DIALYSIS
1000.0000 [IU] | INTRAMUSCULAR | Status: DC | PRN
  Filled 2020-07-25: qty 1

## 2020-07-25 MED ORDER — ACETAMINOPHEN 325 MG PO TABS: 650.0000 mg | ORAL_TABLET | Freq: Four times a day (QID) | ORAL | Status: DC | PRN

## 2020-07-25 MED ORDER — LIDOCAINE-PRILOCAINE 2.5-2.5 % EX CREA
1.0000 "application " | TOPICAL_CREAM | CUTANEOUS | Status: DC | PRN
  Filled 2020-07-25: qty 5

## 2020-07-25 MED ORDER — LIDOCAINE HCL (PF) 1 % IJ SOLN: 5.0000 mL | INTRAMUSCULAR | Status: DC | PRN

## 2020-07-25 MED ORDER — SODIUM ZIRCONIUM CYCLOSILICATE 10 G PO PACK
10.0000 g | PACK | Freq: Once | ORAL | Status: AC
  Administered 2020-07-25: 10 g via ORAL
  Filled 2020-07-25: qty 1

## 2020-07-25 MED ORDER — SODIUM CHLORIDE 0.9 % IV SOLN: 100.0000 mL | INTRAVENOUS | Status: DC | PRN

## 2020-07-25 MED ORDER — ACETAMINOPHEN 325 MG PO TABS
ORAL_TABLET | ORAL | Status: AC
  Administered 2020-07-25: 650 mg
  Filled 2020-07-25: qty 2

## 2020-07-25 MED ORDER — FUROSEMIDE 10 MG/ML IJ SOLN
40.0000 mg | Freq: Once | INTRAMUSCULAR | Status: AC
  Administered 2020-07-25: 40 mg via INTRAVENOUS
  Filled 2020-07-25: qty 4

## 2020-07-25 MED ORDER — CHLORHEXIDINE GLUCONATE CLOTH 2 % EX PADS: 6.0000 | MEDICATED_PAD | Freq: Every day | CUTANEOUS | Status: DC

## 2020-07-25 NOTE — ED Provider Notes (Signed)
[facility] EMERGENCY DEPARTMENT Provider Note  CSN: [id number] Arrival date & time: 07/25/20 0354  Chief Complaint(s) Shortness of Breath and Nausea  HPI Thomas Robinson is a 59 y.o. male with a past medical history listed below including ESRD on dialysis who presents to the emergency department with several days of worsening shortness of breath.  Patient reports that he is not established with a dialysis center and has not had dialysis since 11/15 here. He has worsening peripheral edema. No fevers. Has dry cough.  Requiring increase of his oxygen after severe coughing spells and being ambulatory.  He is endorsing some mild chest tightness related to his volume overload.  No abdominal pain.  No diarrhea.  No other physical complaints.  HPI  Past Medical History Past Medical History:  Diagnosis Date  . Acute exacerbation of congestive heart failure ([place]) 05/16/2019  . Acute kidney injury superimposed on CKD ([place]) 05/24/2017  . Acute on chronic diastolic CHF (congestive heart failure) ([place]) 04/13/2018  . Acute pulmonary edema ([place]) 01/23/2020  . Acute respiratory failure with hypoxia ([place]) 06/04/2019  . AKI (acute kidney injury) ([place]) 04/10/2015  . Anaphylactic shock, unspecified, sequela 06/10/2019  . Arthritis   . Asthma   . Asthma, chronic, unspecified asthma severity, with acute exacerbation 10/23/2017  . Atypical chest pain 02/16/2018  . CAP (community acquired pneumonia) 10/23/2017  . Carpal tunnel syndrome of right wrist 10/05/2018  . Cataract    right - removed by surgery  . Cerebral infarction due to thrombosis of cerebral artery ([place])   . Cerebrovascular accident (CVA) ([place])   . Cervical disc herniation 01/04/2020  . Chest discomfort   . Chest pain 04/13/2018  . Chronic diastolic (congestive) heart failure ([place]) 06/04/2019  . Chronic diastolic heart failure ([place]) 04/13/2018  . Chronic kidney disease   . Chronic low back pain 11/24/2019  . CKD (chronic kidney  disease), stage V ([place]) 05/24/2017  . Constipation   . Cough    chronic cough  . Demand ischemia ([place]) 06/18/2020  . Diastolic dysfunction 35/36/1443  . Dyspnea    "all the time" per patient at PAT 12/30/19  RA sats 87%, no oxygen use  . Encounter for immunization 07/08/2017  . Encounter for removal of sutures 03/29/2020  . ESRD (end stage renal disease) on dialysis ([place]) 02/16/2018   Dialysis  T Thus Sat  - [facility]   . Essential hypertension 04/09/2015  . Fall 06/04/2019  . Fall at home, initial encounter 06/04/2019  . GERD (gastroesophageal reflux disease) 06/04/2019  . GI bleeding 05/24/2017  . Gram-negative sepsis, unspecified ([place]) 09/05/2017  . History of completed stroke 2018  . History of fusion of cervical spine 03/26/2020  . Hyperlipidemia   . Hypertension   . Hypertensive urgency 05/24/2017  . Hypokalemia 06/04/2017  . Iron deficiency anemia, unspecified 09/09/2017  . Kidney failure   . Left shoulder pain 11/11/2019  . Lumbar radiculopathy 11/24/2019  . Lung contusion 06/04/2019  . LVH (left ventricular hypertrophy) due to hypertensive disease, with heart failure ([place]) 06/18/2020  . Macrocytic anemia 05/24/2017  . Moderate protein-calorie malnutrition ([place]) 06/06/2017  . Myofascial pain syndrome 06/12/2020  . Neuritis of right ulnar nerve 09/13/2018  . Non-compliance with renal dialysis ([place]) 02/08/2020  . Other hyperlipidemia 06/04/2017  . Oxygen deficiency 12/30/2019   O2 sats on RA 87% at PAT appt   . Pain in joint of right elbow 09/13/2018  . Renovascular hypertension 06/22/2020  . Respiratory failure, acute ([place])  10/23/2017  . Restless leg syndrome   . Rib fractures 06/2019   Right  . Right rib fracture 06/04/2019  . Secondary hyperparathyroidism of renal origin ([place]) 06/04/2017  . SOB (shortness of breath)   . Stroke ([place]) 2018  . Symptomatic anemia 05/24/2017  . Tachycardia 01/04/2020  . Thoracic ascending aortic aneurysm (HCC)    4.5 cm 06/04/19 CT  . Ulnar  neuropathy 10/05/2018  . Volume overload 10/23/2017   Patient Active Problem List   Diagnosis Date Noted  . Arthritis   . Asthma   . Cataract   . Cerebral infarction due to thrombosis of cerebral artery ([place])   . Cerebrovascular accident (CVA) ([place])   . Chronic kidney disease   . Dyspnea   . Hyperlipidemia   . Hypertension   . Kidney failure   . Restless leg syndrome   . Degenerative lumbar spinal stenosis 06/26/2020  . Diastolic dysfunction 40/34/7425  . Renovascular hypertension 06/22/2020  . Demand ischemia ([place]) 06/18/2020  . LVH (left ventricular hypertrophy) due to hypertensive disease, with heart failure ([place]) 06/18/2020  . Myofascial pain syndrome 06/12/2020  . Encounter for removal of sutures 03/29/2020  . History of fusion of cervical spine 03/26/2020  . Non-compliance with renal dialysis ([place]) 02/08/2020  . Acute pulmonary edema ([place]) 01/23/2020  . Cervical disc herniation 01/04/2020  . Tachycardia 01/04/2020  . SOB (shortness of breath)   . Oxygen deficiency 12/30/2019  . Chronic low back pain 11/24/2019  . Degeneration of lumbar intervertebral disc 11/24/2019  . Lumbar radiculopathy 11/24/2019  . Left shoulder pain 11/11/2019  . Anaphylactic shock, unspecified, sequela 06/10/2019  . Rib fractures 06/05/2019  . Fall at home, initial encounter 06/04/2019  . Right rib fracture 06/04/2019  . Lung contusion 06/04/2019  . Acute respiratory failure with hypoxia ([place]) 06/04/2019  . Anemia in ESRD (end-stage renal disease) ([place]) 06/04/2019  . GERD (gastroesophageal reflux disease) 06/04/2019  . Chronic diastolic (congestive) heart failure ([place]) 06/04/2019  . Fall 06/04/2019  . Thoracic ascending aortic aneurysm ([place]) 06/04/2019  . Acute exacerbation of congestive heart failure ([place]) 05/16/2019  . Carpal tunnel syndrome of right wrist 10/05/2018  . Ulnar neuropathy 10/05/2018  . Neuritis of right ulnar nerve 09/13/2018  . Pain in joint of right elbow 09/13/2018  .  Chest pain 04/13/2018  . Chronic diastolic heart failure ([place]) 04/13/2018  . Acute on chronic diastolic CHF (congestive heart failure) ([place]) 04/13/2018  . Chest discomfort   . Atypical chest pain 02/16/2018  . ESRD on dialysis ([place]) 02/16/2018  . ESRD (end stage renal disease) on dialysis ([place]) 02/16/2018  . Volume overload 10/23/2017  . CAP (community acquired pneumonia) 10/23/2017  . Asthma, chronic, unspecified asthma severity, with acute exacerbation 10/23/2017  . Respiratory failure, acute ([place]) 10/23/2017  . Iron deficiency anemia, unspecified 09/09/2017  . Gram-negative sepsis, unspecified ([place]) 09/05/2017  . Cough 08/23/2017  . Encounter for immunization 07/08/2017  . Moderate protein-calorie malnutrition ([place]) 06/06/2017  . Coagulation defect, unspecified ([place]) 06/04/2017  . Hypokalemia 06/04/2017  . Other hyperlipidemia 06/04/2017  . Secondary hyperparathyroidism of renal origin ([place]) 06/04/2017  . Constipation   . Macrocytic anemia 05/24/2017  . Hypertensive urgency 05/24/2017  . Acute kidney injury superimposed on CKD ([place]) 05/24/2017  . CKD (chronic kidney disease), stage V ([place]) 05/24/2017  . GI bleeding 05/24/2017  . Symptomatic anemia 05/24/2017  . Stroke ([place]) 2018  . AKI (acute kidney injury) ([place]) 04/10/2015  . History of completed stroke   . Essential hypertension 04/09/2015  Home Medication(s) Prior to Admission medications   Medication Sig Start Date End Date Taking? Authorizing Provider  albuterol (VENTOLIN HFA) 108 (90 Base) MCG/ACT inhaler Inhale 2 puffs into the lungs every 6 (six) hours as needed for wheezing or shortness of breath.    Yes [provider]  amLODipine (NORVASC) 10 MG tablet Take 10 mg by mouth daily.    Yes [provider]  atorvastatin (LIPITOR) 20 MG tablet Take 20 mg by mouth daily. 04/19/20  Yes [provider]  AURYXIA 1 GM 210 MG(Fe) tablet Take 420 mg by mouth 3 (three) times daily with meals.  03/16/20   Yes [provider]  BREO ELLIPTA 100-25 MCG/INH AEPB Inhale 1 puff into the lungs daily. 06/26/20  Yes [provider]  carvedilol (COREG) 12.5 MG tablet Take 1 tablet (12.5 mg total) by mouth 2 (two) times daily with a meal. 05/18/19  Yes Martyn Malay, MD  cholecalciferol (VITAMIN D3) 25 MCG (1000 UNIT) tablet Take 1,000 Units by mouth daily.   Yes [provider]  cinacalcet (SENSIPAR) 60 MG tablet Take 60 mg by mouth daily. 11/11/18  Yes [provider]  ferrous sulfate 325 (65 FE) MG tablet Take 325 mg by mouth daily with breakfast.   Yes [provider]  furosemide (LASIX) 40 MG tablet Take 40 mg of Lasix on non-dialysis days. Patient taking differently: Take 40 mg by mouth See admin instructions. On Sunday, Monday, Wednesday, Thursday and Saturday 03/21/20  Yes Tobb, Kardie, DO  gabapentin (NEURONTIN) 300 MG capsule Take 300 mg by mouth 2 (two) times daily as needed (pain).    Yes [provider]  hydrALAZINE (APRESOLINE) 100 MG tablet Take 1 tablet (100 mg total) by mouth every 8 (eight) hours. 05/18/19  Yes Martyn Malay, MD  multivitamin (RENA-VIT) TABS tablet Take 1 tablet by mouth daily. 01/12/18  Yes [provider]  sevelamer carbonate (RENVELA) 800 MG tablet Take 800 mg by mouth daily. 11/07/19  Yes [provider]  Specialty Vitamins Products (HEALTHY HEART COMPLEX PO) Take 1 tablet by mouth daily.   Yes [provider]  metoprolol tartrate (LOPRESSOR) 100 MG tablet Take 100 mg by mouth 2 (two) times daily.     [provider]                                                                                                                                    Past Surgical History Past Surgical History:  Procedure Laterality Date  . ANTERIOR CERVICAL DECOMP/DISCECTOMY FUSION N/A 01/04/2020   Procedure: ANTERIOR CERVICAL DECOMPRESSION/DISCECTOMY FUSION CERVICAL FIVE THROUGH SEVEN;  Surgeon: Melina Schools, MD;  Location: [facility];  Service: Orthopedics;  Laterality: N/A;  3 hrs  . AV FISTULA PLACEMENT Left 05/28/2017   Procedure: LEFT ARM ARTERIOVENOUS (AV) FISTULA CREATION;  Surgeon: Conrad , MD;  Location: [facility];  Service: Vascular;  Laterality: Left;  .  EYE SURGERY Right 06/02/2019   Cataract removed  . FRACTURE SURGERY    . IR FLUORO GUIDE CV LINE RIGHT  05/25/2017  . IR US GUIDE VASC ACCESS RIGHT  05/25/2017  . REVISON OF ARTERIOVENOUS FISTULA Left 07/18/2019   Procedure: REVISION PLICATION OF RADIOCEPHALIC ARTERIOVENOUS FISTULA LEFT ARM;  Surgeon: Angelia Mould, MD;  Location: [facility];  Service: Vascular;  Laterality: Left;  . RINOPLASTY     Family History Family History  Problem Relation Age of Onset  . Cancer Mother     Social History Social History   Tobacco Use  . Smoking status: Never Smoker  . Smokeless tobacco: Never Used  Vaping Use  . Vaping Use: Never used  Substance Use Topics  . Alcohol use: Not Currently    Alcohol/week: 0.0 standard drinks    Comment: has a drink once in a while- 12/30/19  . Drug use: No   Allergies Patient has no known allergies.  Review of Systems Review of Systems All other systems are reviewed and are negative for acute change except as noted in the HPI  Physical Exam Vital Signs  I have reviewed the triage vital signs BP (!) 169/96   Pulse 69   Temp 97.7 F (36.5 C) (Oral)   Resp 20   SpO2 100%   Physical Exam Vitals reviewed.  Constitutional:      General: He is not in acute distress.    Appearance: He is well-developed. He is not diaphoretic.  HENT:     Head: Normocephalic and atraumatic.     Nose: Nose normal.  Eyes:     General: No scleral icterus.       Right eye: No discharge.        Left eye: No discharge.     Conjunctiva/sclera: Conjunctivae normal.     Pupils: Pupils are equal, round, and reactive to light.  Cardiovascular:     Rate and Rhythm: Normal rate and regular rhythm.     Heart  sounds: No murmur heard.  No friction rub. No gallop.   Pulmonary:     Effort: Pulmonary effort is normal. No respiratory distress.     Breath sounds: No stridor. Examination of the right-middle field reveals wheezing. Examination of the left-middle field reveals wheezing. Examination of the right-lower field reveals wheezing. Examination of the left-lower field reveals wheezing. Wheezing present. No rales.  Abdominal:     General: There is no distension.     Palpations: Abdomen is soft.     Tenderness: There is no abdominal tenderness.  Musculoskeletal:        General: No tenderness.     Cervical back: Normal range of motion and neck supple.     Right lower leg: 2+ Pitting Edema present.     Left lower leg: 2+ Pitting Edema present.  Skin:    General: Skin is warm and dry.     Findings: No erythema or rash.  Neurological:     Mental Status: He is alert and oriented to person, place, and time.     ED Results and Treatments Labs (all labs ordered are listed, but only abnormal results are displayed) Labs Reviewed  CBC - Abnormal; Notable for the following components:      Result Value   RBC 2.06 (*)    Hemoglobin 7.2 (*)    HCT 22.6 (*)    MCV 109.7 (*)    MCH 35.0 (*)    Platelets 136 (*)    All  other components within normal limits  BASIC METABOLIC PANEL - Abnormal; Notable for the following components:   Potassium 6.1 (*)    CO2 17 (*)    BUN 107 (*)    Creatinine, Ser 13.33 (*)    GFR, Estimated 4 (*)    Anion gap 23 (*)    All other components within normal limits  RESPIRATORY PANEL BY RT PCR (FLU A&B, COVID)                                                                                                                         EKG  EKG Interpretation  Date/Time:  Wednesday July 25 2020 03:59:21 EST Ventricular Rate:  75 PR Interval:  194 QRS Duration: 82 QT Interval:  422 QTC Calculation: 471 R Axis:   57 Text Interpretation: Normal sinus rhythm Possible  Left atrial enlargement Possible Anterior infarct , age undetermined Abnormal ECG peaked TW Confirmed by Addison Lank [phone]) on 07/25/2020 5:22:50 AM      Radiology DG Chest 2 View  Result Date: 07/25/2020 CLINICAL DATA:  Generalize weakness, shortness of breath, and nausea. EXAM: CHEST - 2 VIEW COMPARISON:  07/16/2020 FINDINGS: Cardiac enlargement. Bilateral patchy airspace disease in the lungs. Changes may represent edema or pneumonia. Small left pleural effusion. No pneumothorax. Mediastinal contours appear intact. Postoperative changes in the cervical spine. IMPRESSION: Cardiac enlargement with bilateral patchy airspace disease and small left pleural effusion. Electronically Signed   By: Lucienne Capers M.D.   On: 07/25/2020 04:47    Pertinent labs & imaging results that were available during my care of the patient were reviewed by me and considered in my medical decision making (see chart for details).  Medications Ordered in ED Medications  sodium zirconium cyclosilicate (LOKELMA) packet 10 g (has no administration in time range)  furosemide (LASIX) injection 40 mg (has no administration in time range)                                                                                                                                    Procedures Procedures  (including critical care time)  Medical Decision Making / ED Course I have reviewed the nursing notes for this encounter and the patient's prior records (if available in EHR or on provided paperwork).   Thomas Robinson was evaluated in Emergency Department on 07/25/2020 for the symptoms described in the history of present  illness. He was evaluated in the context of the global COVID-19 pandemic, which necessitated consideration that the patient might be at risk for infection with the SARS-CoV-2 virus that causes COVID-19. Institutional protocols and algorithms that pertain to the evaluation of patients at risk for COVID-19 are in  a state of rapid change based on information released by regulatory bodies including the CDC and federal and state organizations. These policies and algorithms were followed during the patient's care in the ED.  Patient is here with his shortness of breath in the setting of delayed dialysis. He is volume overloaded Pulmonary edema on chest x-ray Currently satting well on 3 L nasal cannula, which is his baseline. Has mild hyperkalemia with peaked T waves. Given Lokelma and Lasix as he still makes urine. Spoke to nephrology who will schedule for dialysis and discharge afterwards.      Final Clinical Impression(s) / ED Diagnoses Final diagnoses:  End-stage renal disease needing dialysis [facility])      This chart was dictated using voice recognition software.  Despite best efforts to proofread,  errors can occur which can change the documentation meaning.   Fatima Blank, MD 07/25/20 [phone]

## 2020-07-25 NOTE — ED Provider Notes (Signed)
Patient alert, content, no distress, breathing comfortably.   Pt appears stable post dialysis, and pt indicates he feels ready for d/c.   Rec nephrology/HD f/u, and pcp f/u.  Return precautions provided.      Lajean Saver, MD 07/25/20 [phone]

## 2020-07-25 NOTE — Discharge Instructions (Addendum)
It was our pleasure to provide your ER care today - we hope that you feel better.  Follow up with your doctor in the coming week.  Discuss with your doctors facilitating a regular/permanent outpatient dialysis plan.  Return to ER if worse, new symptoms, fevers, increased trouble breathing, or other concern.

## 2020-07-25 NOTE — ED Triage Notes (Addendum)
Pt arrives via [facility] from home with c/o generalized weakness, sob and nausea. Has not had dialysis since 11/15 when he was here in the hospital (does not have a dialysis center currently). 150/70, HR 100, 80s on room air, 96% on 4L ( supposed to wear 2-3L at home). A/ox4.

## 2020-07-25 NOTE — Progress Notes (Signed)
Brief nephrology note: ESRD on HD p/w weakness, sob and hypoxia. No OP HD units. Labs and CXR reviewed.  Plan: HD ordered, 4 hours, 2K, 2.5 ca, 4-5 L UF AVF for the access. Discharge from ER after completion of HD.  D/w ER team and dialysis nurse.

## 2020-07-25 NOTE — ED Notes (Addendum)
Change of shift. First contact. Pt resting in bed. NADN. Awaiting HD

## 2020-07-25 NOTE — ED Notes (Signed)
Pt ambulated to bathroom on his own power, gait with slight limp but steady.

## 2020-07-25 NOTE — ED Notes (Signed)
Pt transported to dialysis

## 2020-07-30 ENCOUNTER — Ambulatory Visit: Payer: Medicare Other | Admitting: Cardiology

## 2020-08-02 ENCOUNTER — Emergency Department (HOSPITAL_COMMUNITY)
Admission: EM | Admit: 2020-08-02 | Discharge: 2020-08-02 | Disposition: A | Discharge status: Home / Self Care | Payer: Medicare Other | Attending: Emergency Medicine | Admitting: Emergency Medicine

## 2020-08-02 ENCOUNTER — Emergency Department (HOSPITAL_COMMUNITY): Payer: Medicare Other

## 2020-08-02 ENCOUNTER — Encounter (HOSPITAL_COMMUNITY): Payer: Self-pay

## 2020-08-02 DIAGNOSIS — J45901 Unspecified asthma with (acute) exacerbation: Secondary | ICD-10-CM | POA: Diagnosis not present

## 2020-08-02 DIAGNOSIS — Z7951 Long term (current) use of inhaled steroids: Secondary | ICD-10-CM | POA: Diagnosis not present

## 2020-08-02 DIAGNOSIS — N186 End stage renal disease: Secondary | ICD-10-CM | POA: Insufficient documentation

## 2020-08-02 DIAGNOSIS — Z992 Dependence on renal dialysis: Secondary | ICD-10-CM | POA: Diagnosis not present

## 2020-08-02 DIAGNOSIS — N179 Acute kidney failure, unspecified: Secondary | ICD-10-CM | POA: Diagnosis present

## 2020-08-02 DIAGNOSIS — R2243 Localized swelling, mass and lump, lower limb, bilateral: Secondary | ICD-10-CM | POA: Diagnosis not present

## 2020-08-02 DIAGNOSIS — I5033 Acute on chronic diastolic (congestive) heart failure: Secondary | ICD-10-CM | POA: Diagnosis not present

## 2020-08-02 DIAGNOSIS — Z79899 Other long term (current) drug therapy: Secondary | ICD-10-CM | POA: Diagnosis not present

## 2020-08-02 DIAGNOSIS — I132 Hypertensive heart and chronic kidney disease with heart failure and with stage 5 chronic kidney disease, or end stage renal disease: Secondary | ICD-10-CM | POA: Insufficient documentation

## 2020-08-02 LAB — CBC
HCT: 23.8 % — ABNORMAL LOW (ref 39.0–52.0)
Hemoglobin: 7.4 g/dL — ABNORMAL LOW (ref 13.0–17.0)
MCH: 33.6 pg (ref 26.0–34.0)
MCHC: 31.1 g/dL (ref 30.0–36.0)
MCV: 108.2 fL — ABNORMAL HIGH (ref 80.0–100.0)
Platelets: 198 10*3/uL (ref 150–400)
RBC: 2.2 MIL/uL — ABNORMAL LOW (ref 4.22–5.81)
RDW: 13.1 % (ref 11.5–15.5)
WBC: 4.8 10*3/uL (ref 4.0–10.5)
nRBC: 0 % (ref 0.0–0.2)

## 2020-08-02 LAB — BASIC METABOLIC PANEL
Anion gap: 27 — ABNORMAL HIGH (ref 5–15)
BUN: 120 mg/dL — ABNORMAL HIGH (ref 8–23)
CO2: 15 mmol/L — ABNORMAL LOW (ref 22–32)
Calcium: 9.4 mg/dL (ref 8.9–10.3)
Chloride: 100 mmol/L (ref 98–111)
Creatinine, Ser: 12.92 mg/dL — ABNORMAL HIGH (ref 0.61–1.24)
GFR, Estimated: 4 mL/min — ABNORMAL LOW (ref >60)
Glucose, Bld: 109 mg/dL — ABNORMAL HIGH (ref 70–99)
Potassium: 5.9 mmol/L — ABNORMAL HIGH (ref 3.5–5.1)
Sodium: 142 mmol/L (ref 135–145)

## 2020-08-02 MED ORDER — DARBEPOETIN ALFA 200 MCG/0.4ML IJ SOSY
200.0000 ug | PREFILLED_SYRINGE | Freq: Once | INTRAMUSCULAR | Status: DC
  Filled 2020-08-02: qty 0.4

## 2020-08-02 MED ORDER — SODIUM CHLORIDE 0.9 % IV SOLN: 100.0000 mL | INTRAVENOUS | Status: DC | PRN

## 2020-08-02 MED ORDER — PENTAFLUOROPROP-TETRAFLUOROETH EX AERO: 1.0000 "application " | INHALATION_SPRAY | CUTANEOUS | Status: DC | PRN

## 2020-08-02 MED ORDER — LIDOCAINE HCL (PF) 1 % IJ SOLN: 5.0000 mL | INTRAMUSCULAR | Status: DC | PRN

## 2020-08-02 MED ORDER — CHLORHEXIDINE GLUCONATE CLOTH 2 % EX PADS: 6.0000 | MEDICATED_PAD | Freq: Every day | CUTANEOUS | Status: DC

## 2020-08-02 MED ORDER — SODIUM ZIRCONIUM CYCLOSILICATE 10 G PO PACK
10.0000 g | PACK | Freq: Once | ORAL | Status: AC
  Administered 2020-08-02: 10 g via ORAL
  Filled 2020-08-02: qty 1

## 2020-08-02 MED ORDER — LIDOCAINE-PRILOCAINE 2.5-2.5 % EX CREA: 1.0000 "application " | TOPICAL_CREAM | CUTANEOUS | Status: DC | PRN

## 2020-08-02 NOTE — ED Notes (Signed)
Pt becoming increasingly agitated while waiting for dialysis.

## 2020-08-02 NOTE — ED Notes (Signed)
Pt still refusing bp cuff.

## 2020-08-02 NOTE — ED Provider Notes (Signed)
Thomas Robinson   CSN: [id number] Arrival date & time: 08/02/20  5462     History Chief Complaint  Patient presents with  . Dialysis    Thomas Robinson is a 59 y.o. male with PMH/o ESRD, CHF, Pulmonary edema, Asthma who presents today for needing dialysis.  Patient states it has been 8 days since his last dialysis.  He is not currently established with anybody.  He states that he started having some shortness of breath which is what brought him to the emergency department.  He also reports he has had some swelling around his ankles.  He states he is on 2.5 L of oxygen at baseline.  He has not had to increase it.  He has not had any chest pain, abdominal pain, nausea/vomiting.  The history is provided by the patient.       Past Medical History:  Diagnosis Date  . Acute exacerbation of congestive heart failure ([place]) 05/16/2019  . Acute kidney injury superimposed on CKD ([place]) 05/24/2017  . Acute on chronic diastolic CHF (congestive heart failure) ([place]) 04/13/2018  . Acute pulmonary edema ([place]) 01/23/2020  . Acute respiratory failure with hypoxia ([place]) 06/04/2019  . AKI (acute kidney injury) ([place]) 04/10/2015  . Anaphylactic shock, unspecified, sequela 06/10/2019  . Arthritis   . Asthma   . Asthma, chronic, unspecified asthma severity, with acute exacerbation 10/23/2017  . Atypical chest pain 02/16/2018  . CAP (community acquired pneumonia) 10/23/2017  . Carpal tunnel syndrome of right wrist 10/05/2018  . Cataract    right - removed by surgery  . Cerebral infarction due to thrombosis of cerebral artery ([place])   . Cerebrovascular accident (CVA) ([place])   . Cervical disc herniation 01/04/2020  . Chest discomfort   . Chest pain 04/13/2018  . Chronic diastolic (congestive) heart failure ([place]) 06/04/2019  . Chronic diastolic heart failure ([place]) 04/13/2018  . Chronic kidney disease   . Chronic low back pain 11/24/2019  . CKD (chronic kidney disease),  stage V ([place]) 05/24/2017  . Constipation   . Cough    chronic cough  . Demand ischemia ([place]) 06/18/2020  . Diastolic dysfunction 70/35/0093  . Dyspnea    "all the time" per patient at PAT 12/30/19  RA sats 87%, no oxygen use  . Encounter for immunization 07/08/2017  . Encounter for removal of sutures 03/29/2020  . ESRD (end stage renal disease) on dialysis ([place]) 02/16/2018   Dialysis  T Thus Sat  - [facility]   . Essential hypertension 04/09/2015  . Fall 06/04/2019  . Fall at home, initial encounter 06/04/2019  . GERD (gastroesophageal reflux disease) 06/04/2019  . GI bleeding 05/24/2017  . Gram-negative sepsis, unspecified ([place]) 09/05/2017  . History of completed stroke 2018  . History of fusion of cervical spine 03/26/2020  . Hyperlipidemia   . Hypertension   . Hypertensive urgency 05/24/2017  . Hypokalemia 06/04/2017  . Iron deficiency anemia, unspecified 09/09/2017  . Kidney failure   . Left shoulder pain 11/11/2019  . Lumbar radiculopathy 11/24/2019  . Lung contusion 06/04/2019  . LVH (left ventricular hypertrophy) due to hypertensive disease, with heart failure ([place]) 06/18/2020  . Macrocytic anemia 05/24/2017  . Moderate protein-calorie malnutrition ([place]) 06/06/2017  . Myofascial pain syndrome 06/12/2020  . Neuritis of right ulnar nerve 09/13/2018  . Non-compliance with renal dialysis ([place]) 02/08/2020  . Other hyperlipidemia 06/04/2017  . Oxygen deficiency 12/30/2019   O2 sats on RA 87% at PAT appt   .  Pain in joint of right elbow 09/13/2018  . Renovascular hypertension 06/22/2020  . Respiratory failure, acute ([place]) 10/23/2017  . Restless leg syndrome   . Rib fractures 06/2019   Right  . Right rib fracture 06/04/2019  . Secondary hyperparathyroidism of renal origin ([place]) 06/04/2017  . SOB (shortness of breath)   . Stroke ([place]) 2018  . Symptomatic anemia 05/24/2017  . Tachycardia 01/04/2020  . Thoracic ascending aortic aneurysm ([facility])    4.5 cm 06/04/19 CT  . Ulnar neuropathy 10/05/2018   . Volume overload 10/23/2017    Patient Active Problem List   Diagnosis Date Noted  . Arthritis   . Asthma   . Cataract   . Cerebral infarction due to thrombosis of cerebral artery ([place])   . Cerebrovascular accident (CVA) ([place])   . Chronic kidney disease   . Dyspnea   . Hyperlipidemia   . Hypertension   . Kidney failure   . Restless leg syndrome   . Degenerative lumbar spinal stenosis 06/26/2020  . Diastolic dysfunction 67/89/3810  . Renovascular hypertension 06/22/2020  . Demand ischemia ([place]) 06/18/2020  . LVH (left ventricular hypertrophy) due to hypertensive disease, with heart failure ([place]) 06/18/2020  . Myofascial pain syndrome 06/12/2020  . Encounter for removal of sutures 03/29/2020  . History of fusion of cervical spine 03/26/2020  . Non-compliance with renal dialysis ([place]) 02/08/2020  . Acute pulmonary edema ([place]) 01/23/2020  . Cervical disc herniation 01/04/2020  . Tachycardia 01/04/2020  . SOB (shortness of breath)   . Oxygen deficiency 12/30/2019  . Chronic low back pain 11/24/2019  . Degeneration of lumbar intervertebral disc 11/24/2019  . Lumbar radiculopathy 11/24/2019  . Left shoulder pain 11/11/2019  . Anaphylactic shock, unspecified, sequela 06/10/2019  . Rib fractures 06/05/2019  . Fall at home, initial encounter 06/04/2019  . Right rib fracture 06/04/2019  . Lung contusion 06/04/2019  . Acute respiratory failure with hypoxia ([place]) 06/04/2019  . Anemia in ESRD (end-stage renal disease) ([place]) 06/04/2019  . GERD (gastroesophageal reflux disease) 06/04/2019  . Chronic diastolic (congestive) heart failure ([place]) 06/04/2019  . Fall 06/04/2019  . Thoracic ascending aortic aneurysm ([place]) 06/04/2019  . Acute exacerbation of congestive heart failure ([place]) 05/16/2019  . Carpal tunnel syndrome of right wrist 10/05/2018  . Ulnar neuropathy 10/05/2018  . Neuritis of right ulnar nerve 09/13/2018  . Pain in joint of right elbow 09/13/2018  . Chest pain  04/13/2018  . Chronic diastolic heart failure ([place]) 04/13/2018  . Acute on chronic diastolic CHF (congestive heart failure) ([place]) 04/13/2018  . Chest discomfort   . Atypical chest pain 02/16/2018  . ESRD on dialysis ([place]) 02/16/2018  . ESRD (end stage renal disease) on dialysis ([place]) 02/16/2018  . Volume overload 10/23/2017  . CAP (community acquired pneumonia) 10/23/2017  . Asthma, chronic, unspecified asthma severity, with acute exacerbation 10/23/2017  . Respiratory failure, acute ([place]) 10/23/2017  . Iron deficiency anemia, unspecified 09/09/2017  . Gram-negative sepsis, unspecified ([place]) 09/05/2017  . Cough 08/23/2017  . Encounter for immunization 07/08/2017  . Moderate protein-calorie malnutrition ([place]) 06/06/2017  . Coagulation defect, unspecified ([place]) 06/04/2017  . Hypokalemia 06/04/2017  . Other hyperlipidemia 06/04/2017  . Secondary hyperparathyroidism of renal origin ([place]) 06/04/2017  . Constipation   . Macrocytic anemia 05/24/2017  . Hypertensive urgency 05/24/2017  . Acute kidney injury superimposed on CKD ([place]) 05/24/2017  . CKD (chronic kidney disease), stage V ([place]) 05/24/2017  . GI bleeding 05/24/2017  . Symptomatic anemia 05/24/2017  . Stroke ([place]) 2018  .  AKI (acute kidney injury) ([place]) 04/10/2015  . History of completed stroke   . Essential hypertension 04/09/2015    Past Surgical History:  Procedure Laterality Date  . ANTERIOR CERVICAL DECOMP/DISCECTOMY FUSION N/A 01/04/2020   Procedure: ANTERIOR CERVICAL DECOMPRESSION/DISCECTOMY FUSION CERVICAL FIVE THROUGH SEVEN;  Surgeon: Melina Schools, MD;  Location: [facility];  Service: Orthopedics;  Laterality: N/A;  3 hrs  . AV FISTULA PLACEMENT Left 05/28/2017   Procedure: LEFT ARM ARTERIOVENOUS (AV) FISTULA CREATION;  Surgeon: Conrad Woodward, MD;  Location: [facility];  Service: Vascular;  Laterality: Left;  . EYE SURGERY Right 06/02/2019   Cataract removed  . FRACTURE SURGERY    . IR FLUORO GUIDE CV LINE RIGHT  05/25/2017    . IR US GUIDE VASC ACCESS RIGHT  05/25/2017  . REVISON OF ARTERIOVENOUS FISTULA Left 07/18/2019   Procedure: REVISION PLICATION OF RADIOCEPHALIC ARTERIOVENOUS FISTULA LEFT ARM;  Surgeon: Angelia Mould, MD;  Location: [facility];  Service: Vascular;  Laterality: Left;  . RINOPLASTY         Family History  Problem Relation Age of Onset  . Cancer Mother     Social History   Tobacco Use  . Smoking status: Never Smoker  . Smokeless tobacco: Never Used  Vaping Use  . Vaping Use: Never used  Substance Use Topics  . Alcohol use: Not Currently    Alcohol/week: 0.0 standard drinks    Comment: has a drink once in a while- 12/30/19  . Drug use: No    Home Medications Prior to Admission medications   Medication Sig Start Date End Date Taking? Authorizing Provider  albuterol (VENTOLIN HFA) 108 (90 Base) MCG/ACT inhaler Inhale 2 puffs into the lungs every 6 (six) hours as needed for wheezing or shortness of breath.    Yes [provider]  amLODipine (NORVASC) 10 MG tablet Take 10 mg by mouth daily.    Yes [provider]  atorvastatin (LIPITOR) 20 MG tablet Take 20 mg by mouth daily. 04/19/20  Yes [provider]  AURYXIA 1 GM 210 MG(Fe) tablet Take 420 mg by mouth 3 (three) times daily with meals.  03/16/20  Yes [provider]  BREO ELLIPTA 100-25 MCG/INH AEPB Inhale 1 puff into the lungs daily. 06/26/20  Yes [provider]  cholecalciferol (VITAMIN D3) 25 MCG (1000 UNIT) tablet Take 1,000 Units by mouth daily.   Yes [provider]  cinacalcet (SENSIPAR) 60 MG tablet Take 60 mg by mouth daily. 11/11/18  Yes [provider]  ferrous sulfate 325 (65 FE) MG tablet Take 325 mg by mouth daily with breakfast.   Yes [provider]  furosemide (LASIX) 40 MG tablet Take 40 mg of Lasix on non-dialysis days. Patient taking differently: Take 40 mg by mouth See admin instructions. On Sunday, Monday, Wednesday, Thursday and  Saturday 03/21/20  Yes Tobb, Kardie, DO  gabapentin (NEURONTIN) 300 MG capsule Take 300 mg by mouth 2 (two) times daily as needed (pain).    Yes [provider]  hydrALAZINE (APRESOLINE) 25 MG tablet Take 50 mg by mouth 3 (three) times daily.   Yes [provider]  metoprolol tartrate (LOPRESSOR) 100 MG tablet Take 100 mg by mouth 2 (two) times daily.    Yes [provider]  multivitamin (RENA-VIT) TABS tablet Take 1 tablet by mouth daily. 01/12/18  Yes [provider]  sevelamer carbonate (RENVELA) 800 MG tablet Take 800 mg by mouth daily. 11/07/19  Yes [provider]  Specialty  Vitamins Products (HEALTHY HEART COMPLEX PO) Take 1 tablet by mouth daily.   Yes [provider]  carvedilol (COREG) 12.5 MG tablet Take 1 tablet (12.5 mg total) by mouth 2 (two) times daily with a meal. Patient not taking: Reported on 07/25/2020 05/18/19   Martyn Malay, MD    Allergies    Patient has no known allergies.  Review of Systems   Review of Systems  Constitutional: Negative for fever.  Respiratory: Positive for shortness of breath. Negative for cough.   Cardiovascular: Positive for leg swelling. Negative for chest pain.  Gastrointestinal: Negative for abdominal pain, nausea and vomiting.  Genitourinary: Negative for dysuria and hematuria.  Neurological: Negative for headaches.  All other systems reviewed and are negative.   Physical Exam Updated Vital Signs BP (!) 163/78   Pulse 76   Temp (!) 97.5 F (36.4 C) (Oral)   Resp (!) 23   SpO2 98%   Physical Exam Vitals and nursing Robinson reviewed.  Constitutional:      Appearance: Normal appearance. He is well-developed.  HENT:     Head: Normocephalic and atraumatic.  Eyes:     General: Lids are normal.     Conjunctiva/sclera: Conjunctivae normal.     Pupils: Pupils are equal, round, and reactive to light.  Cardiovascular:     Rate and Rhythm: Normal rate and regular rhythm.     Pulses:  Normal pulses.     Heart sounds: Normal heart sounds. No murmur heard.  No friction rub. No gallop.   Pulmonary:     Effort: Pulmonary effort is normal.     Breath sounds: Examination of the right-lower field reveals rales. Examination of the left-lower field reveals rales. Rales present.     Comments: Crackles noted bilateral bases. Abdominal:     Palpations: Abdomen is soft. Abdomen is not rigid.     Tenderness: There is no abdominal tenderness. There is no guarding.  Musculoskeletal:        General: Normal range of motion.     Cervical back: Full passive range of motion without pain.     Comments: 1+ pitting edema noted to bilateral ankles   Skin:    General: Skin is warm and dry.     Capillary Refill: Capillary refill takes less than 2 seconds.  Neurological:     Mental Status: He is alert and oriented to person, place, and time.  Psychiatric:        Speech: Speech normal.     ED Results / Procedures / Treatments   Labs (all labs ordered are listed, but only abnormal results are displayed) Labs Reviewed  CBC - Abnormal; Notable for the following components:      Result Value   RBC 2.20 (*)    Hemoglobin 7.4 (*)    HCT 23.8 (*)    MCV 108.2 (*)    All other components within normal limits  BASIC METABOLIC PANEL - Abnormal; Notable for the following components:   Potassium 5.9 (*)    CO2 15 (*)    Glucose, Bld 109 (*)    BUN 120 (*)    Creatinine, Ser 12.92 (*)    GFR, Estimated 4 (*)    Anion gap 27 (*)    All other components within normal limits    EKG EKG Interpretation  Date/Time:  Thursday August 02 2020 11:12:53 EST Ventricular Rate:  73 PR Interval:    QRS Duration: 83 QT Interval:  424 QTC Calculation: 468 R  Axis:   88 Text Interpretation: Sinus rhythm Borderline prolonged PR interval LAE, consider biatrial enlargement Borderline right axis deviation No significant change since last tracing Confirmed by Deno Etienne [phone]) on 08/02/2020 11:47:53  AM   Radiology DG Chest 2 View  Result Date: 08/02/2020 CLINICAL DATA:  Shortness of breath. EXAM: CHEST - 2 VIEW COMPARISON:  07/25/2020. FINDINGS: Mediastinum and hilar structures normal. Stable cardiomegaly. Persistent but improved bilateral mild pulmonary infiltrates/edema. Tiny left pleural effusion again noted. No pneumothorax. Peripheral vascular calcification. Prior cervicothoracic fusion. Degenerative changes thoracic spine. IMPRESSION: 1. Persistent but improved bilateral mild pulmonary infiltrates/edema. Tiny left pleural effusion again noted. 2.  Stable cardiomegaly. Electronically Signed   By: Marcello Moores  Register   On: 08/02/2020 06:36    Procedures Procedures (including critical care time)  Medications Ordered in ED Medications  sodium zirconium cyclosilicate (LOKELMA) packet 10 g (10 g Oral Given 08/02/20 1229)    ED Course  I have reviewed the triage vital signs and the nursing notes.  Pertinent labs & imaging results that were available during my care of the patient were reviewed by me and considered in my medical decision making (see chart for details).    MDM Rules/Calculators/A&P                          59 year old male with past one history of end-stage renal disease who presents for evaluation of needing dialysis.  Patient reports he has a history of end-stage renal disease but does not currently follow with a dialysis clinic.  He states he has had some shortness of breath.  His last emesis was about 8 days ago.  He does not currently follow with a dialysis center.  He is on 2 L of oxygen at baseline and has not had to increase that.  On initial arrival, he is afebrile, slightly hypertensive.  He is satting well on 2 L.  Concern for possible fluid overload/needing dialysis.  CBC shows Hbg of 7.4. BMP is 5.9. BUN is 120, Cr is 12.92. CXR shows some persistent pulmonary edema.   Discussed with Dr. Burnett Sheng (nephrology).  He will schedule patient for dialysis.  Will  probably happen later this afternoon.  Patient given dose of Lokelma.  Portions of this Robinson were generated with [employer]. Dictation errors may occur despite best attempts at proofreading.    Final Clinical Impression(s) / ED Diagnoses Final diagnoses:  ESRD needing dialysis [facility])    Rx / DC Orders ED Discharge Orders    None       Volanda Napoleon, PA-C 08/02/20 [place], [place], DO 08/02/20 1511

## 2020-08-02 NOTE — ED Notes (Signed)
Pt refused to sign AMA form witnessed by Angelica Pou.

## 2020-08-02 NOTE — ED Notes (Signed)
Pt refused vitals 

## 2020-08-02 NOTE — ED Notes (Signed)
Pt refused having bp cuff on.

## 2020-08-02 NOTE — ED Notes (Signed)
Pt sat at 88%, pt placed on 2l per triage nurse. Pt now at 100%

## 2020-08-02 NOTE — ED Triage Notes (Signed)
Pt states that he is here for dialysis, last received 8 days ago due to not having a treatment center anymore.

## 2020-08-05 ENCOUNTER — Other Ambulatory Visit: Payer: Self-pay

## 2020-08-05 ENCOUNTER — Encounter (HOSPITAL_COMMUNITY): Payer: Self-pay | Admitting: Emergency Medicine

## 2020-08-05 ENCOUNTER — Emergency Department (HOSPITAL_COMMUNITY): Payer: Medicare Other

## 2020-08-05 ENCOUNTER — Inpatient Hospital Stay (HOSPITAL_COMMUNITY)
Admission: EM | Admit: 2020-08-05 | Discharge: 2020-08-07 | DRG: 291 | Disposition: A | Discharge status: Home / Self Care | Payer: Medicare Other | Attending: Internal Medicine | Admitting: Internal Medicine

## 2020-08-05 DIAGNOSIS — I10 Essential (primary) hypertension: Secondary | ICD-10-CM | POA: Diagnosis present

## 2020-08-05 DIAGNOSIS — Z20822 Contact with and (suspected) exposure to covid-19: Secondary | ICD-10-CM | POA: Diagnosis present

## 2020-08-05 DIAGNOSIS — Z9115 Patient's noncompliance with renal dialysis: Secondary | ICD-10-CM

## 2020-08-05 DIAGNOSIS — E7849 Other hyperlipidemia: Secondary | ICD-10-CM | POA: Diagnosis present

## 2020-08-05 DIAGNOSIS — Z79899 Other long term (current) drug therapy: Secondary | ICD-10-CM

## 2020-08-05 DIAGNOSIS — I5032 Chronic diastolic (congestive) heart failure: Secondary | ICD-10-CM | POA: Diagnosis present

## 2020-08-05 DIAGNOSIS — Z981 Arthrodesis status: Secondary | ICD-10-CM

## 2020-08-05 DIAGNOSIS — Z87892 Personal history of anaphylaxis: Secondary | ICD-10-CM

## 2020-08-05 DIAGNOSIS — E875 Hyperkalemia: Secondary | ICD-10-CM | POA: Diagnosis not present

## 2020-08-05 DIAGNOSIS — D638 Anemia in other chronic diseases classified elsewhere: Secondary | ICD-10-CM | POA: Diagnosis present

## 2020-08-05 DIAGNOSIS — I248 Other forms of acute ischemic heart disease: Secondary | ICD-10-CM

## 2020-08-05 DIAGNOSIS — D649 Anemia, unspecified: Secondary | ICD-10-CM

## 2020-08-05 DIAGNOSIS — D631 Anemia in chronic kidney disease: Secondary | ICD-10-CM | POA: Diagnosis present

## 2020-08-05 DIAGNOSIS — N186 End stage renal disease: Secondary | ICD-10-CM

## 2020-08-05 DIAGNOSIS — K219 Gastro-esophageal reflux disease without esophagitis: Secondary | ICD-10-CM | POA: Diagnosis present

## 2020-08-05 DIAGNOSIS — E8770 Fluid overload, unspecified: Secondary | ICD-10-CM | POA: Diagnosis present

## 2020-08-05 DIAGNOSIS — H269 Unspecified cataract: Secondary | ICD-10-CM | POA: Diagnosis present

## 2020-08-05 DIAGNOSIS — M1990 Unspecified osteoarthritis, unspecified site: Secondary | ICD-10-CM | POA: Diagnosis present

## 2020-08-05 DIAGNOSIS — I132 Hypertensive heart and chronic kidney disease with heart failure and with stage 5 chronic kidney disease, or end stage renal disease: Secondary | ICD-10-CM | POA: Diagnosis not present

## 2020-08-05 DIAGNOSIS — D509 Iron deficiency anemia, unspecified: Secondary | ICD-10-CM | POA: Diagnosis present

## 2020-08-05 DIAGNOSIS — I712 Thoracic aortic aneurysm, without rupture: Secondary | ICD-10-CM | POA: Diagnosis present

## 2020-08-05 DIAGNOSIS — J45909 Unspecified asthma, uncomplicated: Secondary | ICD-10-CM | POA: Diagnosis present

## 2020-08-05 DIAGNOSIS — Z8673 Personal history of transient ischemic attack (TIA), and cerebral infarction without residual deficits: Secondary | ICD-10-CM

## 2020-08-05 DIAGNOSIS — G2581 Restless legs syndrome: Secondary | ICD-10-CM | POA: Diagnosis present

## 2020-08-05 DIAGNOSIS — N19 Unspecified kidney failure: Secondary | ICD-10-CM

## 2020-08-05 DIAGNOSIS — Z992 Dependence on renal dialysis: Secondary | ICD-10-CM

## 2020-08-05 DIAGNOSIS — E8779 Other fluid overload: Secondary | ICD-10-CM | POA: Diagnosis not present

## 2020-08-05 LAB — IRON AND TIBC
Iron: 84 ug/dL (ref 45–182)
Saturation Ratios: 33 % (ref 17.9–39.5)
TIBC: 255 ug/dL (ref 250–450)
UIBC: 171 ug/dL

## 2020-08-05 LAB — BASIC METABOLIC PANEL
Anion gap: 26 — ABNORMAL HIGH (ref 5–15)
BUN: 137 mg/dL — ABNORMAL HIGH (ref 8–23)
CO2: 14 mmol/L — ABNORMAL LOW (ref 22–32)
Calcium: 9.3 mg/dL (ref 8.9–10.3)
Chloride: 100 mmol/L (ref 98–111)
Creatinine, Ser: 13.61 mg/dL — ABNORMAL HIGH (ref 0.61–1.24)
GFR, Estimated: 4 mL/min — ABNORMAL LOW (ref >60)
Glucose, Bld: 106 mg/dL — ABNORMAL HIGH (ref 70–99)
Potassium: 5.8 mmol/L — ABNORMAL HIGH (ref 3.5–5.1)
Sodium: 140 mmol/L (ref 135–145)

## 2020-08-05 LAB — TROPONIN I (HIGH SENSITIVITY)
Troponin I (High Sensitivity): 85 ng/L — ABNORMAL HIGH (ref <18)
Troponin I (High Sensitivity): 93 ng/L — ABNORMAL HIGH (ref <18)

## 2020-08-05 LAB — CBC
HCT: 20.4 % — ABNORMAL LOW (ref 39.0–52.0)
Hemoglobin: 6.6 g/dL — CL (ref 13.0–17.0)
MCH: 35.3 pg — ABNORMAL HIGH (ref 26.0–34.0)
MCHC: 32.4 g/dL (ref 30.0–36.0)
MCV: 109.1 fL — ABNORMAL HIGH (ref 80.0–100.0)
Platelets: 163 10*3/uL (ref 150–400)
RBC: 1.87 MIL/uL — ABNORMAL LOW (ref 4.22–5.81)
RDW: 13.2 % (ref 11.5–15.5)
WBC: 4.4 10*3/uL (ref 4.0–10.5)
nRBC: 0 % (ref 0.0–0.2)

## 2020-08-05 LAB — HIV ANTIBODY (ROUTINE TESTING W REFLEX): HIV Screen 4th Generation wRfx: NONREACTIVE

## 2020-08-05 LAB — RESP PANEL BY RT-PCR (FLU A&B, COVID) ARPGX2
Influenza A by PCR: NEGATIVE
Influenza B by PCR: NEGATIVE
SARS Coronavirus 2 by RT PCR: NEGATIVE

## 2020-08-05 LAB — FERRITIN: Ferritin: 815 ng/mL — ABNORMAL HIGH (ref 24–336)

## 2020-08-05 LAB — PREPARE RBC (CROSSMATCH)

## 2020-08-05 LAB — PHOSPHORUS: Phosphorus: 9.9 mg/dL — ABNORMAL HIGH (ref 2.5–4.6)

## 2020-08-05 LAB — GLUCOSE, CAPILLARY: Glucose-Capillary: 90 mg/dL (ref 70–99)

## 2020-08-05 MED ORDER — HEPARIN SODIUM (PORCINE) 5000 UNIT/ML IJ SOLN
5000.0000 [IU] | Freq: Three times a day (TID) | INTRAMUSCULAR | Status: DC
  Administered 2020-08-06 – 2020-08-07 (×4): 5000 [IU] via SUBCUTANEOUS
  Filled 2020-08-05 (×5): qty 1

## 2020-08-05 MED ORDER — METOPROLOL TARTRATE 100 MG PO TABS
100.0000 mg | ORAL_TABLET | Freq: Two times a day (BID) | ORAL | Status: DC
  Administered 2020-08-06 (×2): 100 mg via ORAL
  Filled 2020-08-05 (×2): qty 1

## 2020-08-05 MED ORDER — ACETAMINOPHEN 650 MG RE SUPP: 650.0000 mg | Freq: Four times a day (QID) | RECTAL | Status: DC | PRN

## 2020-08-05 MED ORDER — FUROSEMIDE 40 MG PO TABS
40.0000 mg | ORAL_TABLET | ORAL | Status: DC
  Administered 2020-08-06: 40 mg via ORAL
  Filled 2020-08-05: qty 1

## 2020-08-05 MED ORDER — CHLORHEXIDINE GLUCONATE CLOTH 2 % EX PADS: 6.0000 | MEDICATED_PAD | Freq: Every day | CUTANEOUS | Status: DC

## 2020-08-05 MED ORDER — HYDRALAZINE HCL 50 MG PO TABS
50.0000 mg | ORAL_TABLET | Freq: Three times a day (TID) | ORAL | Status: DC
  Administered 2020-08-06 (×3): 50 mg via ORAL
  Filled 2020-08-05 (×3): qty 1

## 2020-08-05 MED ORDER — FERRIC CITRATE 1 GM 210 MG(FE) PO TABS
420.0000 mg | ORAL_TABLET | Freq: Three times a day (TID) | ORAL | Status: DC
  Administered 2020-08-06 – 2020-08-07 (×4): 420 mg via ORAL
  Filled 2020-08-05 (×5): qty 2

## 2020-08-05 MED ORDER — RENA-VITE PO TABS
1.0000 | ORAL_TABLET | Freq: Every day | ORAL | Status: DC
  Administered 2020-08-06 – 2020-08-07 (×2): 1 via ORAL
  Filled 2020-08-05 (×2): qty 1

## 2020-08-05 MED ORDER — ATORVASTATIN CALCIUM 10 MG PO TABS
20.0000 mg | ORAL_TABLET | Freq: Every day | ORAL | Status: DC
  Administered 2020-08-06 – 2020-08-07 (×2): 20 mg via ORAL
  Filled 2020-08-05 (×2): qty 2

## 2020-08-05 MED ORDER — DARBEPOETIN ALFA 200 MCG/0.4ML IJ SOSY
200.0000 ug | PREFILLED_SYRINGE | INTRAMUSCULAR | Status: DC
  Filled 2020-08-05: qty 0.4

## 2020-08-05 MED ORDER — SODIUM CHLORIDE 0.9% FLUSH
3.0000 mL | Freq: Two times a day (BID) | INTRAVENOUS | Status: DC
  Administered 2020-08-05: 3 mL via INTRAVENOUS
  Administered 2020-08-06: 2.5 mL via INTRAVENOUS
  Administered 2020-08-06: 3 mL via INTRAVENOUS

## 2020-08-05 MED ORDER — FERROUS SULFATE 325 (65 FE) MG PO TABS
325.0000 mg | ORAL_TABLET | Freq: Every day | ORAL | Status: DC
  Administered 2020-08-06 – 2020-08-07 (×2): 325 mg via ORAL
  Filled 2020-08-05 (×2): qty 1

## 2020-08-05 MED ORDER — FLUTICASONE FUROATE-VILANTEROL 100-25 MCG/INH IN AEPB
1.0000 | INHALATION_SPRAY | Freq: Every day | RESPIRATORY_TRACT | Status: DC
  Administered 2020-08-06: 1 via RESPIRATORY_TRACT
  Filled 2020-08-05: qty 28

## 2020-08-05 MED ORDER — ALBUTEROL SULFATE HFA 108 (90 BASE) MCG/ACT IN AERS
2.0000 | INHALATION_SPRAY | Freq: Four times a day (QID) | RESPIRATORY_TRACT | Status: DC | PRN
  Filled 2020-08-05: qty 6.7

## 2020-08-05 MED ORDER — ACETAMINOPHEN 325 MG PO TABS
650.0000 mg | ORAL_TABLET | Freq: Four times a day (QID) | ORAL | Status: DC | PRN
  Administered 2020-08-06: 650 mg via ORAL
  Filled 2020-08-05: qty 2

## 2020-08-05 MED ORDER — FUROSEMIDE 10 MG/ML IJ SOLN
40.0000 mg | Freq: Once | INTRAMUSCULAR | Status: AC
  Administered 2020-08-05: 40 mg via INTRAVENOUS
  Filled 2020-08-05: qty 4

## 2020-08-05 MED ORDER — AMLODIPINE BESYLATE 10 MG PO TABS
10.0000 mg | ORAL_TABLET | Freq: Every day | ORAL | Status: DC
  Administered 2020-08-06: 10 mg via ORAL
  Filled 2020-08-05: qty 1

## 2020-08-05 MED ORDER — CINACALCET HCL 30 MG PO TABS
60.0000 mg | ORAL_TABLET | Freq: Every day | ORAL | Status: DC
  Administered 2020-08-06 – 2020-08-07 (×2): 60 mg via ORAL
  Filled 2020-08-05 (×2): qty 2

## 2020-08-05 MED ORDER — SODIUM CHLORIDE 0.9 % IV SOLN
10.0000 mL/h | Freq: Once | INTRAVENOUS | Status: AC
  Administered 2020-08-05: 10 mL/h via INTRAVENOUS

## 2020-08-05 NOTE — Consult Note (Signed)
Renal Service Consult Note [facility]  Thomas Robinson 08/05/2020 [name], MD Requesting Physician: Dr. Gilford Raid, Thomas Robinson.   Reason for Consult: ESRD pt here w/ multiple problems HPI: The patient is a 59 y.o. year-old w/ hx of CVA, HTN, HL, ESRD on HD, GI bleed presented to ED requesting dialysis. States he does not have a HD center at the moment, last HD was 11 days ago in the hospital here. C/o gen weakness, dizziness, dry cough, SOB and chest burning. Sats 83% on RA, 99% on 4L [place]. Labs showed high BUN/ Cr, K+ 5.8.  Hb 6.6. wBC 4K. Asked to see for dialysis. Pt will be admitted.   Pt states main issue is SOB, some orthopnea. No distress. Coughing a lot. No fevers, no CP, no abd pain.     Last HD records in Epic are from June 2021>>   3h 53min  2/2 bath  Hep 1800 bolus  L RC AVF  Prof 4  350/800   ROS  denies CP  no joint pain   no HA  no blurry vision  no rash  no diarrhea  no nausea/ vomiting    Past Medical History  Past Medical History:  Diagnosis Date  . Acute on chronic diastolic CHF (congestive heart failure) ([place]) 04/13/2018  . Acute pulmonary edema ([place]) 01/23/2020  . Acute respiratory failure with hypoxia ([place]) 06/04/2019  . Anaphylactic shock, unspecified, sequela 06/10/2019  . Arthritis   . Asthma, chronic, unspecified asthma severity, with acute exacerbation 10/23/2017  . Atypical chest pain 02/16/2018  . CAP (community acquired pneumonia) 10/23/2017  . Carpal tunnel syndrome of right wrist 10/05/2018  . Cataract    right - removed by surgery  . Cerebral infarction due to thrombosis of cerebral artery ([place])   . Cervical disc herniation 01/04/2020  . Chest pain 04/13/2018  . Chronic diastolic heart failure ([place]) 04/13/2018  . Chronic low back pain 11/24/2019  . Constipation   . Cough    chronic cough  . Demand ischemia ([place]) 06/18/2020  . Dyspnea    "all the time" per patient at PAT 12/30/19  RA sats 87%, no oxygen use  . Encounter for  immunization 07/08/2017  . ESRD on hemodialysis ([place]) 02/16/2018   Dialysis  T Thus Sat  - [facility]   . Fall 06/04/2019  . Fall at home, initial encounter 06/04/2019  . GERD (gastroesophageal reflux disease) 06/04/2019  . GI bleeding 05/24/2017  . Gram-negative sepsis, unspecified ([place]) 09/05/2017  . History of completed stroke 2018  . History of fusion of cervical spine 03/26/2020  . Hyperlipidemia   . Hypertension   . Hypertensive urgency 05/24/2017  . Hypokalemia 06/04/2017  . Iron deficiency anemia, unspecified 09/09/2017  . Left shoulder pain 11/11/2019  . Lumbar radiculopathy 11/24/2019  . Lung contusion 06/04/2019  . LVH (left ventricular hypertrophy) due to hypertensive disease, with heart failure ([place]) 06/18/2020  . Macrocytic anemia 05/24/2017  . Moderate protein-calorie malnutrition ([place]) 06/06/2017  . Myofascial pain syndrome 06/12/2020  . Neuritis of right ulnar nerve 09/13/2018  . Non-compliance with renal dialysis ([place]) 02/08/2020  . Other hyperlipidemia 06/04/2017  . Oxygen deficiency 12/30/2019   O2 sats on RA 87% at PAT appt   . Pain in joint of right elbow 09/13/2018  . Renovascular hypertension 06/22/2020  . Respiratory failure, acute ([place]) 10/23/2017  . Restless leg syndrome   . Rib fractures 06/2019   Right  . Secondary hyperparathyroidism of renal origin ([place]) 06/04/2017  .  Symptomatic anemia 05/24/2017  . Thoracic ascending aortic aneurysm ([facility])    4.5 cm 06/04/19 CT  . Ulnar neuropathy 10/05/2018  . Volume overload 10/23/2017   Past Surgical History  Past Surgical History:  Procedure Laterality Date  . ANTERIOR CERVICAL DECOMP/DISCECTOMY FUSION N/A 01/04/2020   Procedure: ANTERIOR CERVICAL DECOMPRESSION/DISCECTOMY FUSION CERVICAL FIVE THROUGH SEVEN;  Surgeon: Melina Schools, MD;  Location: [place];  Service: Orthopedics;  Laterality: N/A;  3 hrs  . AV FISTULA PLACEMENT Left 05/28/2017   Procedure: LEFT ARM ARTERIOVENOUS (AV) FISTULA CREATION;  Surgeon: Conrad [place],  MD;  Location: [place];  Service: Vascular;  Laterality: Left;  . EYE SURGERY Right 06/02/2019   Cataract removed  . FRACTURE SURGERY    . IR FLUORO GUIDE CV LINE RIGHT  05/25/2017  . IR US GUIDE VASC ACCESS RIGHT  05/25/2017  . REVISON OF ARTERIOVENOUS FISTULA Left 07/18/2019   Procedure: REVISION PLICATION OF RADIOCEPHALIC ARTERIOVENOUS FISTULA LEFT ARM;  Surgeon: Angelia Mould, MD;  Location: [place];  Service: Vascular;  Laterality: Left;  . RINOPLASTY     Family History  Family History  Problem Relation Age of Onset  . Cancer Mother    Social History  reports that he has never smoked. He has never used smokeless tobacco. He reports previous alcohol use. He reports that he does not use drugs. Allergies No Known Allergies Home medications Prior to Admission medications   Medication Sig Start Date End Date Taking? Authorizing Provider  albuterol (VENTOLIN HFA) 108 (90 Base) MCG/ACT inhaler Inhale 2 puffs into the lungs every 6 (six) hours as needed for wheezing or shortness of breath.     [provider]  amLODipine (NORVASC) 10 MG tablet Take 10 mg by mouth daily.     [provider]  atorvastatin (LIPITOR) 20 MG tablet Take 20 mg by mouth daily. 04/19/20   [provider]  AURYXIA 1 GM 210 MG(Fe) tablet Take 420 mg by mouth 3 (three) times daily with meals.  03/16/20   [provider]  BREO ELLIPTA 100-25 MCG/INH AEPB Inhale 1 puff into the lungs daily. 06/26/20   [provider]  carvedilol (COREG) 12.5 MG tablet Take 1 tablet (12.5 mg total) by mouth 2 (two) times daily with a meal. Patient not taking: Reported on 07/25/2020 05/18/19   Martyn Malay, MD  cholecalciferol (VITAMIN D3) 25 MCG (1000 UNIT) tablet Take 1,000 Units by mouth daily.    [provider]  cinacalcet (SENSIPAR) 60 MG tablet Take 60 mg by mouth daily. 11/11/18   [provider]  ferrous sulfate 325 (65 FE) MG tablet Take 325 mg by mouth daily with  breakfast.    [provider]  furosemide (LASIX) 40 MG tablet Take 40 mg of Lasix on non-dialysis days. Patient taking differently: Take 40 mg by mouth See admin instructions. On Sunday, Monday, Wednesday, Thursday and Saturday 03/21/20   Tobb, Kardie, DO  gabapentin (NEURONTIN) 300 MG capsule Take 300 mg by mouth 2 (two) times daily as needed (pain).     [provider]  hydrALAZINE (APRESOLINE) 25 MG tablet Take 50 mg by mouth 3 (three) times daily.    [provider]  metoprolol tartrate (LOPRESSOR) 100 MG tablet Take 100 mg by mouth 2 (two) times daily.     [provider]  multivitamin (RENA-VIT) TABS tablet Take 1 tablet by mouth daily. 01/12/18   [provider]  sevelamer carbonate (RENVELA) 800 MG tablet Take 800 mg  by mouth daily. 11/07/19   [provider]  Specialty Vitamins Products (HEALTHY HEART COMPLEX PO) Take 1 tablet by mouth daily.    [provider]     Vitals:   08/05/20 1945 08/05/20 2000 08/05/20 2015 08/05/20 2102  BP: (!) 168/94 (!) 157/83 (!) 162/83 (!) 153/80  Pulse: 71 68 70 71  Resp: 18 19 17 18   Temp:    (!) 97.4 F (36.3 C)  TempSrc:    Oral  SpO2: 97% 93% 97% 92%   Exam Gen tired appearing, no distress No rash, cyanosis or gangrene Sclera anicteric, throat clear  No jvd or bruits Chest bilat rhonchi, bases clear RRR no MRG  Abd soft ntnd no mass or ascites +bs  GU normal male  MS no joint effusions or deformity Ext 2+ bilat LE edema, no wounds or ulcers  Neuro is alert, Ox 3 , nf , no asterixis   Home meds:  - norvasc 10/ coreg 12.5 bid/ lasix 40 mg prn/ hydralazine 50 tid/ metoprolol 100 bid  - renvela 800 ac tid/ renavite qd/ auryxia 420 tid ac  - neurontin 300 bid prn  - lipitor qd/ breo eliipta bid/ vit D3/ fe so4  - prn's/ vitamins/ supplements    OP HD: does not have OP HD unit, released from [facility] due to behavior issues in October 2021   -- last OP HD orders from Epic from  June 2021 >>   3h 40min  2/2 bath  Hep 1800 bolus  L RC AVF  Prof 4  350/800    - last ESA here was 11/09 w/ darbe 100ug   - last tsat in system was from 2019    CXR 12/5 - IMPRESSION: 1. Similar appearance to the prior exams. Bilateral interstitial thickening is most likely chronic. Cannot exclude a degree superimposed interstitial edema. 2. No evidence of pneumonia. 3. Mild stable cardiomegal   Assessment/ Plan: 1. Uremia - due to missed HD. Pt is w/o an outpatient HD unit (was released from [facility] in October due to behavior issues). Last HD was 11/24 here. BUN 137, creat 13.  Pt c/o gen'd weakness, dry cough , SOB. Plan HD tonight and tomorrow.  2. Vol overload - sig cough and SOB, CXR no edema 3. ESRD - as above, no OP unit at this time. HD here.  4. Anemia - sig anemia , Hb 6.6.  Last esa was on 11/09 here, will redose and ^ to 200ug since unlikely to get esa regularly.  Check fe/ tibc.  5. MBD ckd - Ca 9.3, phos pend 6. H/o CVA      Rob Abdulaziz Toman  MD 08/05/2020, 10:09 PM  Recent Labs  Lab 08/02/20 0605 08/05/20 1559  WBC 4.8 4.4  HGB 7.4* 6.6*   Recent Labs  Lab 08/02/20 0605 08/05/20 1559  K 5.9* 5.8*  BUN 120* 137*  CREATININE 12.92* 13.61*  CALCIUM 9.4 9.3

## 2020-08-05 NOTE — Progress Notes (Signed)
New Admission Note:   Arrival Method: ED via stretcher Mental Orientation: Alert and oriented x4 Telemetry: 59m06, CCMD notified Assessment: Completed Skin: Intact, refer to flowsheet IV:  RAC, saline locked Pain: 0/10 Tubes: None Safety Measures: Safety Fall Prevention Plan has been discussed  Admission: initiated 5 [place] Orientation: Patient has been oriented to the room, unit and staff.   Family: none at bedside  Orders to be reviewed and implemented. Will continue to monitor the patient. Call light has been placed within reach and bed alarm has been activated.

## 2020-08-05 NOTE — H&P (Signed)
History and Physical   Thomas Robinson ZMO:[id number] DOB: 1960/10/02 DOA: 08/05/2020  PCP: [name], PA   Patient coming from: Home  Chief Complaint: Shortness of breath, weakness  HPI: Thomas Robinson is a 59 y.o. male with medical history significant of ESRD on HD, CHF, anemia of CKD, iron deficiency anemia, asthma, cataracts, CVA, hypertension, hyperlipidemia, GERD, history of GI bleed, restless leg, thoracic descending aortic aneurysm who presents with worsening shortness of breath and generalized weakness.  He states he has not had dialysis for weeks and he is in between dialysis centers after having an argument with a staff member of his previous center.  He reports having weakness and shortness of breath for about the past 3 weeks.  Today he reports he had an episode where he stood up and his vision was blurry temporarily.  He also endorses nausea for the past day. He denies fever, abdominal pain, diarrhea, changes in stool.  Chart review shows that he has been seen in the ED for dialysis recently.  He was last dialyzed on 11/24. He was in the ED for dialysis on 11/2 but left due to the long wait for HD.  ED Course: Vital signs in the ED significant for hypertension in the 035W to 656C systolic, saturations of 12% on room air and 99 on 3 L.  Labs showed potassium of 5.8, bicarb 14, BUN 137, creatinine 13.  Calcium was 9.3.  CBC showed hemoglobin of 6.6 down from 7.4.  Troponin was 85.  Respiratory panel for flu and Covid pending FOBT is pending.  Nephrology was consulted in the ED and are in the process of arranging dialysis patient.  Review of Systems: As per HPI otherwise all other systems reviewed and are negative.  Past Medical History:  Diagnosis Date  . Acute on chronic diastolic CHF (congestive heart failure) ([place]) 04/13/2018  . Acute pulmonary edema ([place]) 01/23/2020  . Acute respiratory failure with hypoxia ([place]) 06/04/2019  . Anaphylactic shock, unspecified, sequela  06/10/2019  . Arthritis   . Asthma, chronic, unspecified asthma severity, with acute exacerbation 10/23/2017  . Atypical chest pain 02/16/2018  . CAP (community acquired pneumonia) 10/23/2017  . Carpal tunnel syndrome of right wrist 10/05/2018  . Cataract    right - removed by surgery  . Cerebral infarction due to thrombosis of cerebral artery ([place])   . Cervical disc herniation 01/04/2020  . Chest pain 04/13/2018  . Chronic diastolic heart failure ([place]) 04/13/2018  . Chronic low back pain 11/24/2019  . Constipation   . Cough    chronic cough  . Demand ischemia ([place]) 06/18/2020  . Dyspnea    "all the time" per patient at PAT 12/30/19  RA sats 87%, no oxygen use  . Encounter for immunization 07/08/2017  . ESRD on hemodialysis ([place]) 02/16/2018   Dialysis  T Thus Sat  - [facility]   . Fall 06/04/2019  . Fall at home, initial encounter 06/04/2019  . GERD (gastroesophageal reflux disease) 06/04/2019  . GI bleeding 05/24/2017  . Gram-negative sepsis, unspecified ([place]) 09/05/2017  . History of completed stroke 2018  . History of fusion of cervical spine 03/26/2020  . Hyperlipidemia   . Hypertension   . Hypertensive urgency 05/24/2017  . Hypokalemia 06/04/2017  . Iron deficiency anemia, unspecified 09/09/2017  . Left shoulder pain 11/11/2019  . Lumbar radiculopathy 11/24/2019  . Lung contusion 06/04/2019  . LVH (left ventricular hypertrophy) due to hypertensive disease, with heart failure ([place]) 06/18/2020  . Macrocytic  anemia 05/24/2017  . Moderate protein-calorie malnutrition ([place]) 06/06/2017  . Myofascial pain syndrome 06/12/2020  . Neuritis of right ulnar nerve 09/13/2018  . Non-compliance with renal dialysis ([place]) 02/08/2020  . Other hyperlipidemia 06/04/2017  . Oxygen deficiency 12/30/2019   O2 sats on RA 87% at PAT appt   . Pain in joint of right elbow 09/13/2018  . Renovascular hypertension 06/22/2020  . Respiratory failure, acute ([place]) 10/23/2017  . Restless leg syndrome   . Rib fractures  06/2019   Right  . Secondary hyperparathyroidism of renal origin ([place]) 06/04/2017  . Symptomatic anemia 05/24/2017  . Thoracic ascending aortic aneurysm (HCC)    4.5 cm 06/04/19 CT  . Ulnar neuropathy 10/05/2018  . Volume overload 10/23/2017    Past Surgical History:  Procedure Laterality Date  . ANTERIOR CERVICAL DECOMP/DISCECTOMY FUSION N/A 01/04/2020   Procedure: ANTERIOR CERVICAL DECOMPRESSION/DISCECTOMY FUSION CERVICAL FIVE THROUGH SEVEN;  Surgeon: Melina Schools, MD;  Location: [facility];  Service: Orthopedics;  Laterality: N/A;  3 hrs  . AV FISTULA PLACEMENT Left 05/28/2017   Procedure: LEFT ARM ARTERIOVENOUS (AV) FISTULA CREATION;  Surgeon: Conrad Newald, MD;  Location: [facility];  Service: Vascular;  Laterality: Left;  . EYE SURGERY Right 06/02/2019   Cataract removed  . FRACTURE SURGERY    . IR FLUORO GUIDE CV LINE RIGHT  05/25/2017  . IR US GUIDE VASC ACCESS RIGHT  05/25/2017  . REVISON OF ARTERIOVENOUS FISTULA Left 07/18/2019   Procedure: REVISION PLICATION OF RADIOCEPHALIC ARTERIOVENOUS FISTULA LEFT ARM;  Surgeon: Angelia Mould, MD;  Location: [facility];  Service: Vascular;  Laterality: Left;  . RINOPLASTY      Social History  reports that he has never smoked. He has never used smokeless tobacco. He reports previous alcohol use. He reports that he does not use drugs.  No Known Allergies  Family History  Problem Relation Age of Onset  . Cancer Mother   Reviewed on admission  Prior to Admission medications   Medication Sig Start Date End Date Taking? Authorizing Provider  albuterol (VENTOLIN HFA) 108 (90 Base) MCG/ACT inhaler Inhale 2 puffs into the lungs every 6 (six) hours as needed for wheezing or shortness of breath.     [provider]  amLODipine (NORVASC) 10 MG tablet Take 10 mg by mouth daily.     [provider]  atorvastatin (LIPITOR) 20 MG tablet Take 20 mg by mouth daily. 04/19/20   [provider]  AURYXIA 1 GM 210 MG(Fe) tablet Take 420 mg  by mouth 3 (three) times daily with meals.  03/16/20   [provider]  BREO ELLIPTA 100-25 MCG/INH AEPB Inhale 1 puff into the lungs daily. 06/26/20   [provider]  carvedilol (COREG) 12.5 MG tablet Take 1 tablet (12.5 mg total) by mouth 2 (two) times daily with a meal. Patient not taking: Reported on 07/25/2020 05/18/19   Martyn Malay, MD  cholecalciferol (VITAMIN D3) 25 MCG (1000 UNIT) tablet Take 1,000 Units by mouth daily.    [provider]  cinacalcet (SENSIPAR) 60 MG tablet Take 60 mg by mouth daily. 11/11/18   [provider]  ferrous sulfate 325 (65 FE) MG tablet Take 325 mg by mouth daily with breakfast.    [provider]  furosemide (LASIX) 40 MG tablet Take 40 mg of Lasix on non-dialysis days. Patient taking differently: Take 40 mg by mouth See admin instructions. On Sunday, Monday, Wednesday, Thursday and Saturday 03/21/20   [name], DO  gabapentin (NEURONTIN) 300 MG capsule Take 300 mg by mouth 2 (two) times daily as needed (pain).     [provider]  hydrALAZINE (APRESOLINE) 25 MG tablet Take 50 mg by mouth 3 (three) times daily.    [provider]  metoprolol tartrate (LOPRESSOR) 100 MG tablet Take 100 mg by mouth 2 (two) times daily.     [provider]  multivitamin (RENA-VIT) TABS tablet Take 1 tablet by mouth daily. 01/12/18   [provider]  sevelamer carbonate (RENVELA) 800 MG tablet Take 800 mg by mouth daily. 11/07/19   [provider]  Specialty Vitamins Products (HEALTHY HEART COMPLEX PO) Take 1 tablet by mouth daily.    [provider]    Physical Exam: Vitals:   08/05/20 1744 08/05/20 1800 08/05/20 1900 08/05/20 1915  BP: (!) 162/75 (!) 145/77 (!) 149/79 (!) 170/122  Pulse: 70  73 70  Resp: 18 (!) 22 20 20   Temp:      TempSrc:      SpO2: 98%  92% 99%   Physical Exam Constitutional:      General: He is not in acute distress.    Comments: Chronically ill  appearing  HENT:     Head: Normocephalic and atraumatic.     Mouth/Throat:     Mouth: Mucous membranes are moist.     Pharynx: Oropharynx is clear.  Eyes:     Extraocular Movements: Extraocular movements intact.     Pupils: Pupils are equal, round, and reactive to light.  Cardiovascular:     Rate and Rhythm: Normal rate and regular rhythm.     Pulses: Normal pulses.     Heart sounds: Murmur heard.   Pulmonary:     Effort: Pulmonary effort is normal. No respiratory distress.     Breath sounds: Normal breath sounds.  Abdominal:     General: Bowel sounds are normal. There is no distension.     Palpations: Abdomen is soft.     Tenderness: There is no abdominal tenderness.  Musculoskeletal:        General: No swelling or deformity.     Right lower leg: Edema present.     Left lower leg: Edema present.  Skin:    General: Skin is warm and dry.  Neurological:     General: No focal deficit present.     Mental Status: Mental status is at baseline.    Labs on Admission: I have personally reviewed following labs and imaging studies  CBC: Recent Labs  Lab 08/02/20 0605 08/05/20 1559  WBC 4.8 4.4  HGB 7.4* 6.6*  HCT 23.8* 20.4*  MCV 108.2* 109.1*  PLT 198 161    Basic Metabolic Panel: Recent Labs  Lab 08/02/20 0605 08/05/20 1559  NA 142 140  K 5.9* 5.8*  CL 100 100  CO2 15* 14*  GLUCOSE 109* 106*  BUN 120* 137*  CREATININE 12.92* 13.61*  CALCIUM 9.4 9.3    GFR: Estimated Creatinine Clearance: 4.8 mL/min (A) (by C-G formula based on SCr of 13.61 mg/dL (H)).  Liver Function Tests: No results for input(s): AST, ALT, ALKPHOS, BILITOT, PROT, ALBUMIN in the last 168 hours.  Urine analysis:    Component Value Date/Time   COLORURINE YELLOW 05/24/2017 1729   APPEARANCEUR HAZY (A) 05/24/2017 1729   LABSPEC 1.011 05/24/2017 1729   PHURINE 5.0 05/24/2017 1729   GLUCOSEU 50 (A) 05/24/2017 1729   HGBUR SMALL (A) 05/24/2017 [place] NEGATIVE 05/24/2017 1729  KETONESUR NEGATIVE 05/24/2017 1729   PROTEINUR 100 (A) 05/24/2017 1729   UROBILINOGEN 0.2 04/09/2015 1527   NITRITE NEGATIVE 05/24/2017 1729   LEUKOCYTESUR TRACE (A) 05/24/2017 1729    Radiological Exams on Admission: DG Chest Portable 1 View  Result Date: 08/05/2020 CLINICAL DATA:  Pt to triage via [place]. Has not received dialysis in 11 days because he is "in between dialysis centers". C/o generalized weakness, dizziness, dry cough, SOB, and chest burning. O2 sats 83% on room air. Pt placed on 4 liters of oxygen. EXAM: PORTABLE CHEST 1 VIEW COMPARISON:  08/02/2020 and older studies. FINDINGS: Cardiac silhouette is mildly enlarged. No mediastinal or hilar masses. Bilateral interstitial prominence is similar to the prior exam, and similar to older studies. This suggests this is all chronic. No lung consolidation. No pleural effusion or pneumothorax. Skeletal structures are grossly intact. IMPRESSION: 1. Similar appearance to the prior exams. Bilateral interstitial thickening is most likely chronic. Cannot exclude a degree superimposed interstitial edema. 2. No evidence of pneumonia. 3. Mild stable cardiomegaly. Electronically Signed   By: Lajean Manes M.D.   On: 08/05/2020 18:29    EKG: Independently reviewed.  Sinus rhythm at 74 bpm, possible left atrial enlargement, borderline normal T waves V3 through 5.  Assessment/Plan Principal Problem:   Volume overload Active Problems:   Essential hypertension   Uremia   Chronic diastolic heart failure (HCC)   GERD (gastroesophageal reflux disease)   Other hyperlipidemia   Arthritis   History of CVA (cerebrovascular accident)   ESRD (end stage renal disease) on dialysis (HCC)   Symptomatic anemia   Hyperkalemia  ESRD on HD Uremia Hyperkalemia Volume overload > Currently between dialysis centers as per HPI, has been receiving HD in the ED > Last HD session was 11/24, left early on 12/2 from ED due to wait for HD > Nephrology consulted and  and are arranging dialysis > Labs in ED showed potassium 5.8, bicarb 14, BUN 37, creatinine 13 - Appreciate nephrology recommendations - Dialysis today - Receiving a dose of Lasix in ED - Continue home Aurxia, and Sensipar - Trend renal function and electrolytes - Renal diet and continue Rena-Vite  Anemia > History of anemia of ESRD, iron deficiency (also history of GI bleed) > ESRD is presumed etiology, checking iron studies and FOBT for completeness > Hemoglobin of 6.6 with reports of increased weakness - Continue with transfusion begun in the ED - Trend CBC - Follow-up iron studies and FOBT  CHF Hypertension Ascending thoracic aortic aneurysm > Last echo September 2020 with EF greater than 65% and impaired relaxation > Volume managed with a combination of dialysis and Lasix on nondialysis days - Continue home amlodipine, hydralazine, metoprolol, lasix  CVA  Hyperlipidemia - Continue home atorvastatin  Asthma  - Continue home Breo and as needed albuterol  GERD > Not currently on medication GERD   DVT prophylaxis: Heparin  Code Status:   Full  Family Communication:  Discussed with family member at bedside. Disposition Plan:   Patient is from:  Home  Anticipated DC to:  Home  Anticipated DC date:  12/6  Anticipated DC barriers: None  Consults called:  Nephrology, Dr. Jonnie Finner, consulted by EDP Admission status:  Observation, telemetry   Severity of Illness: The appropriate patient status for this patient is OBSERVATION. Observation status is judged to be reasonable and necessary in order to provide the required intensity of service to ensure the patient's safety. The patient's presenting symptoms, physical exam findings, and initial radiographic and  laboratory data in the context of their medical condition is felt to place them at decreased risk for further clinical deterioration. Furthermore, it is anticipated that the patient will be medically stable for discharge from  the hospital within 2 midnights of admission. The following factors support the patient status of observation.   " The patient's presenting symptoms include weakness, shortness of breath, nausea. " The physical exam findings include volume overload. " The initial radiographic and laboratory data are concerning for volume overload and worsening renal function in the setting of being off HD for 11 days.  Hyperkalemia.  Anemia hemoglobin 6.6.   Marcelyn Bruins MD [facility]  How to contact the [facility] Attending or Consulting provider [place] or covering provider during after hours [place], for this patient?   1. Check the care team in [facility] and look for a) attending/consulting [facility] provider listed and b) the [facility] team listed 2. Log into [email or web address] and use [place]'s universal password to access. If you do not have the password, please contact the hospital operator. 3. Locate the [facility] provider you are looking for under [facility] and page to a number that you can be directly reached. 4. If you still have difficulty reaching the provider, please page the [facility] (Director on Call) for the Hospitalists listed on amion for assistance.  08/05/2020, 7:39 PM

## 2020-08-05 NOTE — ED Provider Notes (Signed)
[facility] EMERGENCY DEPARTMENT Provider Note   CSN: [id number] Arrival date & time: 08/05/20  1522     History Chief Complaint  Patient presents with  . Weakness  . Shortness of Breath    Thomas Robinson is a 59 y.o. male.  The history is provided by the patient and medical records. No language interpreter was used.  Weakness Associated symptoms: shortness of breath   Shortness of Breath    59 year old male significant history of end-stage renal disease currently on T-T-sat dialysis, CHF, COPD, CVA, anemia brought here via EMS for evaluation of shortness of breath and generalized weakness.  Patient report he has not had dialysis for approximately 3 weeks.  States he is in between center after involving argument with one of the staff previously.  He mention having increased generalized weakness and having shortness of breath for the past 3 weeks and today he had a near syncopal episode when he stood up and his vision went blank transiently.  He also endorsed feeling very nauseous for the past few days.  States he followed very little because he have not been eating much.  He has not noticed any change in his stool.  He still make urine.  He is unsure if he is fully vaccinated for COVID-19.  He denies any active chest pain.  Past Medical History:  Diagnosis Date  . Acute exacerbation of congestive heart failure ([place]) 05/16/2019  . Acute kidney injury superimposed on CKD ([place]) 05/24/2017  . Acute on chronic diastolic CHF (congestive heart failure) ([place]) 04/13/2018  . Acute pulmonary edema ([place]) 01/23/2020  . Acute respiratory failure with hypoxia ([place]) 06/04/2019  . AKI (acute kidney injury) ([place]) 04/10/2015  . Anaphylactic shock, unspecified, sequela 06/10/2019  . Arthritis   . Asthma   . Asthma, chronic, unspecified asthma severity, with acute exacerbation 10/23/2017  . Atypical chest pain 02/16/2018  . CAP (community acquired pneumonia) 10/23/2017  . Carpal tunnel  syndrome of right wrist 10/05/2018  . Cataract    right - removed by surgery  . Cerebral infarction due to thrombosis of cerebral artery ([place])   . Cerebrovascular accident (CVA) ([place])   . Cervical disc herniation 01/04/2020  . Chest discomfort   . Chest pain 04/13/2018  . Chronic diastolic (congestive) heart failure ([place]) 06/04/2019  . Chronic diastolic heart failure ([place]) 04/13/2018  . Chronic kidney disease   . Chronic low back pain 11/24/2019  . CKD (chronic kidney disease), stage V ([place]) 05/24/2017  . Constipation   . Cough    chronic cough  . Demand ischemia ([place]) 06/18/2020  . Diastolic dysfunction 56/38/7564  . Dyspnea    "all the time" per patient at PAT 12/30/19  RA sats 87%, no oxygen use  . Encounter for immunization 07/08/2017  . Encounter for removal of sutures 03/29/2020  . ESRD (end stage renal disease) on dialysis ([place]) 02/16/2018   Dialysis  T Thus Sat  - [facility]   . Essential hypertension 04/09/2015  . Fall 06/04/2019  . Fall at home, initial encounter 06/04/2019  . GERD (gastroesophageal reflux disease) 06/04/2019  . GI bleeding 05/24/2017  . Gram-negative sepsis, unspecified ([place]) 09/05/2017  . History of completed stroke 2018  . History of fusion of cervical spine 03/26/2020  . Hyperlipidemia   . Hypertension   . Hypertensive urgency 05/24/2017  . Hypokalemia 06/04/2017  . Iron deficiency anemia, unspecified 09/09/2017  . Kidney failure   . Left shoulder pain 11/11/2019  . Lumbar  radiculopathy 11/24/2019  . Lung contusion 06/04/2019  . LVH (left ventricular hypertrophy) due to hypertensive disease, with heart failure ([place]) 06/18/2020  . Macrocytic anemia 05/24/2017  . Moderate protein-calorie malnutrition ([place]) 06/06/2017  . Myofascial pain syndrome 06/12/2020  . Neuritis of right ulnar nerve 09/13/2018  . Non-compliance with renal dialysis ([place]) 02/08/2020  . Other hyperlipidemia 06/04/2017  . Oxygen deficiency 12/30/2019   O2 sats on RA 87% at PAT appt   . Pain in  joint of right elbow 09/13/2018  . Renovascular hypertension 06/22/2020  . Respiratory failure, acute ([place]) 10/23/2017  . Restless leg syndrome   . Rib fractures 06/2019   Right  . Right rib fracture 06/04/2019  . Secondary hyperparathyroidism of renal origin ([place]) 06/04/2017  . SOB (shortness of breath)   . Stroke ([place]) 2018  . Symptomatic anemia 05/24/2017  . Tachycardia 01/04/2020  . Thoracic ascending aortic aneurysm (H[facility])    4.5 cm 06/04/19 CT  . Ulnar neuropathy 10/05/2018  . Volume overload 10/23/2017    Patient Active Problem List   Diagnosis Date Noted  . Arthritis   . Asthma   . Cataract   . Cerebral infarction due to thrombosis of cerebral artery ([place])   . Cerebrovascular accident (CVA) ([place])   . Chronic kidney disease   . Dyspnea   . Hyperlipidemia   . Hypertension   . Kidney failure   . Restless leg syndrome   . Degenerative lumbar spinal stenosis 06/26/2020  . Diastolic dysfunction 01/60/1093  . Renovascular hypertension 06/22/2020  . Demand ischemia ([place]) 06/18/2020  . LVH (left ventricular hypertrophy) due to hypertensive disease, with heart failure ([place]) 06/18/2020  . Myofascial pain syndrome 06/12/2020  . Encounter for removal of sutures 03/29/2020  . History of fusion of cervical spine 03/26/2020  . Non-compliance with renal dialysis ([place]) 02/08/2020  . Acute pulmonary edema ([place]) 01/23/2020  . Cervical disc herniation 01/04/2020  . Tachycardia 01/04/2020  . SOB (shortness of breath)   . Oxygen deficiency 12/30/2019  . Chronic low back pain 11/24/2019  . Degeneration of lumbar intervertebral disc 11/24/2019  . Lumbar radiculopathy 11/24/2019  . Left shoulder pain 11/11/2019  . Anaphylactic shock, unspecified, sequela 06/10/2019  . Rib fractures 06/05/2019  . Fall at home, initial encounter 06/04/2019  . Right rib fracture 06/04/2019  . Lung contusion 06/04/2019  . Acute respiratory failure with hypoxia ([place]) 06/04/2019  . Anemia in ESRD (end-stage renal  disease) ([place]) 06/04/2019  . GERD (gastroesophageal reflux disease) 06/04/2019  . Chronic diastolic (congestive) heart failure ([place]) 06/04/2019  . Fall 06/04/2019  . Thoracic ascending aortic aneurysm ([place]) 06/04/2019  . Acute exacerbation of congestive heart failure ([place]) 05/16/2019  . Carpal tunnel syndrome of right wrist 10/05/2018  . Ulnar neuropathy 10/05/2018  . Neuritis of right ulnar nerve 09/13/2018  . Pain in joint of right elbow 09/13/2018  . Chest pain 04/13/2018  . Chronic diastolic heart failure ([place]) 04/13/2018  . Acute on chronic diastolic CHF (congestive heart failure) ([place]) 04/13/2018  . Chest discomfort   . Atypical chest pain 02/16/2018  . ESRD on dialysis ([place]) 02/16/2018  . ESRD (end stage renal disease) on dialysis ([place]) 02/16/2018  . Volume overload 10/23/2017  . CAP (community acquired pneumonia) 10/23/2017  . Asthma, chronic, unspecified asthma severity, with acute exacerbation 10/23/2017  . Respiratory failure, acute ([place]) 10/23/2017  . Iron deficiency anemia, unspecified 09/09/2017  . Gram-negative sepsis, unspecified ([place]) 09/05/2017  . Cough 08/23/2017  . Encounter for immunization 07/08/2017  . Moderate  protein-calorie malnutrition ([place]) 06/06/2017  . Coagulation defect, unspecified ([place]) 06/04/2017  . Hypokalemia 06/04/2017  . Other hyperlipidemia 06/04/2017  . Secondary hyperparathyroidism of renal origin ([place]) 06/04/2017  . Constipation   . Macrocytic anemia 05/24/2017  . Hypertensive urgency 05/24/2017  . Acute kidney injury superimposed on CKD ([place]) 05/24/2017  . CKD (chronic kidney disease), stage V ([place]) 05/24/2017  . GI bleeding 05/24/2017  . Symptomatic anemia 05/24/2017  . Stroke ([place]) 2018  . AKI (acute kidney injury) ([place]) 04/10/2015  . History of completed stroke   . Essential hypertension 04/09/2015    Past Surgical History:  Procedure Laterality Date  . ANTERIOR CERVICAL DECOMP/DISCECTOMY FUSION N/A 01/04/2020   Procedure:  ANTERIOR CERVICAL DECOMPRESSION/DISCECTOMY FUSION CERVICAL FIVE THROUGH SEVEN;  Surgeon: Melina Schools, MD;  Location: [facility];  Service: Orthopedics;  Laterality: N/A;  3 hrs  . AV FISTULA PLACEMENT Left 05/28/2017   Procedure: LEFT ARM ARTERIOVENOUS (AV) FISTULA CREATION;  Surgeon: Conrad Canal Winchester, MD;  Location: [facility];  Service: Vascular;  Laterality: Left;  . EYE SURGERY Right 06/02/2019   Cataract removed  . FRACTURE SURGERY    . IR FLUORO GUIDE CV LINE RIGHT  05/25/2017  . IR US GUIDE VASC ACCESS RIGHT  05/25/2017  . REVISON OF ARTERIOVENOUS FISTULA Left 07/18/2019   Procedure: REVISION PLICATION OF RADIOCEPHALIC ARTERIOVENOUS FISTULA LEFT ARM;  Surgeon: Angelia Mould, MD;  Location: [facility];  Service: Vascular;  Laterality: Left;  . RINOPLASTY         Family History  Problem Relation Age of Onset  . Cancer Mother     Social History   Tobacco Use  . Smoking status: Never Smoker  . Smokeless tobacco: Never Used  Vaping Use  . Vaping Use: Never used  Substance Use Topics  . Alcohol use: Not Currently    Alcohol/week: 0.0 standard drinks    Comment: has a drink once in a while- 12/30/19  . Drug use: No    Home Medications Prior to Admission medications   Medication Sig Start Date End Date Taking? Authorizing Provider  albuterol (VENTOLIN HFA) 108 (90 Base) MCG/ACT inhaler Inhale 2 puffs into the lungs every 6 (six) hours as needed for wheezing or shortness of breath.     [provider]  amLODipine (NORVASC) 10 MG tablet Take 10 mg by mouth daily.     [provider]  atorvastatin (LIPITOR) 20 MG tablet Take 20 mg by mouth daily. 04/19/20   [provider]  AURYXIA 1 GM 210 MG(Fe) tablet Take 420 mg by mouth 3 (three) times daily with meals.  03/16/20   [provider]  BREO ELLIPTA 100-25 MCG/INH AEPB Inhale 1 puff into the lungs daily. 06/26/20   [provider]  carvedilol (COREG) 12.5 MG tablet Take 1 tablet (12.5 mg total)  by mouth 2 (two) times daily with a meal. Patient not taking: Reported on 07/25/2020 05/18/19   Martyn Malay, MD  cholecalciferol (VITAMIN D3) 25 MCG (1000 UNIT) tablet Take 1,000 Units by mouth daily.    [provider]  cinacalcet (SENSIPAR) 60 MG tablet Take 60 mg by mouth daily. 11/11/18   [provider]  ferrous sulfate 325 (65 FE) MG tablet Take 325 mg by mouth daily with breakfast.    [provider]  furosemide (LASIX) 40 MG tablet Take 40 mg of Lasix on non-dialysis days. Patient taking differently: Take 40 mg by mouth See admin instructions. On Sunday, Monday, Wednesday, Thursday and Saturday  03/21/20   Tobb, Kardie, DO  gabapentin (NEURONTIN) 300 MG capsule Take 300 mg by mouth 2 (two) times daily as needed (pain).     [provider]  hydrALAZINE (APRESOLINE) 25 MG tablet Take 50 mg by mouth 3 (three) times daily.    [provider]  metoprolol tartrate (LOPRESSOR) 100 MG tablet Take 100 mg by mouth 2 (two) times daily.     [provider]  multivitamin (RENA-VIT) TABS tablet Take 1 tablet by mouth daily. 01/12/18   [provider]  sevelamer carbonate (RENVELA) 800 MG tablet Take 800 mg by mouth daily. 11/07/19   [provider]  Specialty Vitamins Products (HEALTHY HEART COMPLEX PO) Take 1 tablet by mouth daily.    [provider]    Allergies    Patient has no known allergies.  Review of Systems   Review of Systems  Respiratory: Positive for shortness of breath.   Neurological: Positive for weakness.  All other systems reviewed and are negative.   Physical Exam Updated Vital Signs BP (!) 162/75 (BP Location: Right Arm)   Pulse 70   Temp 98 F (36.7 C) (Oral)   Resp 18   SpO2 98%   Physical Exam Vitals and nursing note reviewed.  Constitutional:      Comments: Patient is  ill-appearing, pale, with supplemental oxygen and speak softly.  He does not appears to be in any acute distress   HENT:     Head: Atraumatic.  Eyes:     Conjunctiva/sclera: Conjunctivae normal.  Cardiovascular:     Rate and Rhythm: Normal rate and regular rhythm.  Pulmonary:     Effort: Pulmonary effort is normal.     Breath sounds: Normal breath sounds. No decreased breath sounds, wheezing, rhonchi or rales.  Chest:     Chest wall: No tenderness.  Abdominal:     Palpations: Abdomen is soft.     Tenderness: There is no abdominal tenderness.  Musculoskeletal:     Cervical back: Neck supple.     Right lower leg: Edema (2+ pitting extending towards the knee bilaterally) present.     Left lower leg: Edema present.  Skin:    Coloration: Skin is pale.     Findings: No rash.  Neurological:     Mental Status: He is alert and oriented to person, place, and time.  Psychiatric:        Mood and Affect: Mood normal.     ED Results / Procedures / Treatments   Labs (all labs ordered are listed, but only abnormal results are displayed) Labs Reviewed  BASIC METABOLIC PANEL - Abnormal; Notable for the following components:      Result Value   Potassium 5.8 (*)    CO2 14 (*)    Glucose, Bld 106 (*)    BUN 137 (*)    Creatinine, Ser 13.61 (*)    GFR, Estimated 4 (*)    Anion gap 26 (*)    All other components within normal limits  CBC - Abnormal; Notable for the following components:   RBC 1.87 (*)    Hemoglobin 6.6 (*)    HCT 20.4 (*)    MCV 109.1 (*)    MCH 35.3 (*)    All other components within normal limits  TROPONIN I (HIGH SENSITIVITY) - Abnormal; Notable for the following components:   Troponin I (High Sensitivity) 85 (*)    All other components within normal limits  RESP PANEL BY RT-PCR (FLU A&B,  COVID) ARPGX2  IRON AND TIBC  FERRITIN  HIV ANTIBODY (ROUTINE TESTING W REFLEX)  CBC  RENAL FUNCTION PANEL  POC OCCULT BLOOD, ED  TYPE AND SCREEN  PREPARE RBC (CROSSMATCH)  TROPONIN I (HIGH SENSITIVITY)    EKG EKG Interpretation  Date/Time:  Sunday August 05 2020 15:22:57  EST Ventricular Rate:  74 PR Interval:  188 QRS Duration: 92 QT Interval:  422 QTC Calculation: 468 R Axis:   60 Text Interpretation: Normal sinus rhythm Possible Left atrial enlargement Minimal voltage criteria for LVH, may be normal variant ( Cornell product ) Borderline ECG No significant change since last tracing Confirmed by Isla Pence [phone]) on 08/05/2020 6:04:45 PM   Radiology DG Chest Portable 1 View  Result Date: 08/05/2020 CLINICAL DATA:  Pt to triage via [facility]. Has not received dialysis in 11 days because he is "in between dialysis centers". C/o generalized weakness, dizziness, dry cough, SOB, and chest burning. O2 sats 83% on room air. Pt placed on 4 liters of oxygen. EXAM: PORTABLE CHEST 1 VIEW COMPARISON:  08/02/2020 and older studies. FINDINGS: Cardiac silhouette is mildly enlarged. No mediastinal or hilar masses. Bilateral interstitial prominence is similar to the prior exam, and similar to older studies. This suggests this is all chronic. No lung consolidation. No pleural effusion or pneumothorax. Skeletal structures are grossly intact. IMPRESSION: 1. Similar appearance to the prior exams. Bilateral interstitial thickening is most likely chronic. Cannot exclude a degree superimposed interstitial edema. 2. No evidence of pneumonia. 3. Mild stable cardiomegaly. Electronically Signed   By: Lajean Manes M.D.   On: 08/05/2020 18:29    Procedures .Critical Care Performed by: Domenic Moras, PA-C Authorized by: Domenic Moras, PA-C   Critical care provider statement:    Critical care time (minutes):  33   Critical care was time spent personally by me on the following activities:  Discussions with consultants, evaluation of patient's response to treatment, examination of patient, ordering and performing treatments and interventions, ordering and review of laboratory studies, ordering and review of radiographic studies, pulse oximetry, re-evaluation of patient's condition, obtaining  history from patient or surrogate and review of old charts   (including critical care time)  Medications Ordered in ED Medications  metoprolol tartrate (LOPRESSOR) tablet 100 mg (has no administration in time range)  atorvastatin (LIPITOR) tablet 20 mg (has no administration in time range)  amLODipine (NORVASC) tablet 10 mg (has no administration in time range)  hydrALAZINE (APRESOLINE) tablet 50 mg (has no administration in time range)  cinacalcet (SENSIPAR) tablet 60 mg (has no administration in time range)  ferric citrate (AURYXIA) tablet 420 mg (has no administration in time range)  ferrous sulfate tablet 325 mg (has no administration in time range)  multivitamin (RENA-VIT) tablet 1 tablet (has no administration in time range)  albuterol (VENTOLIN HFA) 108 (90 Base) MCG/ACT inhaler 2 puff (has no administration in time range)  fluticasone furoate-vilanterol (BREO ELLIPTA) 100-25 MCG/INH 1 puff (has no administration in time range)  heparin injection 5,000 Units (has no administration in time range)  sodium chloride flush (NS) 0.9 % injection 3 mL (has no administration in time range)  acetaminophen (TYLENOL) tablet 650 mg (has no administration in time range)    Or  acetaminophen (TYLENOL) suppository 650 mg (has no administration in time range)  Chlorhexidine Gluconate Cloth 2 % PADS 6 each (has no administration in time range)  0.9 %  sodium chloride infusion (10 mL/hr Intravenous New Bag/Given 08/05/20 1906)  furosemide (LASIX) injection 40 mg (  40 mg Intravenous Given 08/05/20 1902)    ED Course  I have reviewed the triage vital signs and the nursing notes.  Pertinent labs & imaging results that were available during my care of the patient were reviewed by me and considered in my medical decision making (see chart for details).    MDM Rules/Calculators/A&P                          BP (!) 149/79   Pulse 73   Temp 98 F (36.7 C) (Oral)   Resp 20   SpO2 92%   Final  Clinical Impression(s) / ED Diagnoses Final diagnoses:  Symptomatic anemia  Dialysis patient, noncompliant ([place])  Demand ischemia ([place])    Rx / DC Orders ED Discharge Orders    None     6:15 PM Patient has missed dialysis for approximately 3 weeks here with generalized weakness shortness of breath and lightheadedness.  He is pale in appearance, hemoglobin of 6.6 likely secondary to chronic kidney disease.  His baseline hemoglobin is usually 7-8.  Potassium is 5.8 without EKG changes.  Troponin of 85 likely demand ischemia.  He will need to be transfused with blood products, and will also need dialysis.  Will consult for admission.  Care discussed with Dr. Gilford Raid.   6:37 PM Appreciate consultation from nephrologist, Dr. Burnett Sheng, who agrees to have patient set up for dialysis.  I will also consult for admission.  Since patient still make urine, will give 40 mg of Lasix to help with diuresis.  6:57 PM Appreciate consultation from Internal Medicine resident who agrees to see and admit pt for further care. Screening covid test ordered.   Thomas Robinson was evaluated in Emergency Department on 08/05/2020 for the symptoms described in the history of present illness. He was evaluated in the context of the global COVID-19 pandemic, which necessitated consideration that the patient might be at risk for infection with the SARS-CoV-2 virus that causes COVID-19. Institutional protocols and algorithms that pertain to the evaluation of patients at risk for COVID-19 are in a state of rapid change based on information released by regulatory bodies including the CDC and federal and state organizations. These policies and algorithms were followed during the patient's care in the ED.    Domenic Moras, PA-C 08/05/20 1913    Isla Pence, MD 08/05/20 [phone]

## 2020-08-05 NOTE — ED Notes (Signed)
CRITICAL LAB RESULT- Hgb 6.6.  Dr. Laverta Baltimore and Charge RN notified.

## 2020-08-05 NOTE — ED Triage Notes (Signed)
Pt to triage via [facility].  Has not received dialysis in 11 days because he is "in between dialysis centers".  C/o generalized weakness, dizziness, dry cough, SOB, and chest burning.  O2 sats 83% on room air.  Pt placed on 4 liters [place] via EMS and sats up to 99%.

## 2020-08-06 DIAGNOSIS — H269 Unspecified cataract: Secondary | ICD-10-CM | POA: Diagnosis present

## 2020-08-06 DIAGNOSIS — I248 Other forms of acute ischemic heart disease: Secondary | ICD-10-CM | POA: Diagnosis present

## 2020-08-06 DIAGNOSIS — I5032 Chronic diastolic (congestive) heart failure: Secondary | ICD-10-CM

## 2020-08-06 DIAGNOSIS — Z981 Arthrodesis status: Secondary | ICD-10-CM | POA: Diagnosis not present

## 2020-08-06 DIAGNOSIS — N186 End stage renal disease: Secondary | ICD-10-CM

## 2020-08-06 DIAGNOSIS — Z79899 Other long term (current) drug therapy: Secondary | ICD-10-CM | POA: Diagnosis not present

## 2020-08-06 DIAGNOSIS — J45909 Unspecified asthma, uncomplicated: Secondary | ICD-10-CM | POA: Diagnosis present

## 2020-08-06 DIAGNOSIS — I132 Hypertensive heart and chronic kidney disease with heart failure and with stage 5 chronic kidney disease, or end stage renal disease: Secondary | ICD-10-CM | POA: Diagnosis present

## 2020-08-06 DIAGNOSIS — G2581 Restless legs syndrome: Secondary | ICD-10-CM | POA: Diagnosis present

## 2020-08-06 DIAGNOSIS — Z20822 Contact with and (suspected) exposure to covid-19: Secondary | ICD-10-CM | POA: Diagnosis present

## 2020-08-06 DIAGNOSIS — Z9115 Patient's noncompliance with renal dialysis: Secondary | ICD-10-CM | POA: Diagnosis not present

## 2020-08-06 DIAGNOSIS — E8779 Other fluid overload: Secondary | ICD-10-CM | POA: Diagnosis not present

## 2020-08-06 DIAGNOSIS — D509 Iron deficiency anemia, unspecified: Secondary | ICD-10-CM | POA: Diagnosis present

## 2020-08-06 DIAGNOSIS — D631 Anemia in chronic kidney disease: Secondary | ICD-10-CM | POA: Diagnosis present

## 2020-08-06 DIAGNOSIS — Z992 Dependence on renal dialysis: Secondary | ICD-10-CM

## 2020-08-06 DIAGNOSIS — Z87892 Personal history of anaphylaxis: Secondary | ICD-10-CM | POA: Diagnosis not present

## 2020-08-06 DIAGNOSIS — Z8673 Personal history of transient ischemic attack (TIA), and cerebral infarction without residual deficits: Secondary | ICD-10-CM | POA: Diagnosis not present

## 2020-08-06 DIAGNOSIS — E7849 Other hyperlipidemia: Secondary | ICD-10-CM | POA: Diagnosis present

## 2020-08-06 DIAGNOSIS — I712 Thoracic aortic aneurysm, without rupture: Secondary | ICD-10-CM | POA: Diagnosis present

## 2020-08-06 DIAGNOSIS — K219 Gastro-esophageal reflux disease without esophagitis: Secondary | ICD-10-CM | POA: Diagnosis present

## 2020-08-06 DIAGNOSIS — E875 Hyperkalemia: Secondary | ICD-10-CM | POA: Diagnosis present

## 2020-08-06 LAB — CBC
HCT: 27.4 % — ABNORMAL LOW (ref 39.0–52.0)
Hemoglobin: 9.7 g/dL — ABNORMAL LOW (ref 13.0–17.0)
MCH: 32.7 pg (ref 26.0–34.0)
MCHC: 35.4 g/dL (ref 30.0–36.0)
MCV: 92.3 fL (ref 80.0–100.0)
Platelets: 159 10*3/uL (ref 150–400)
RBC: 2.97 MIL/uL — ABNORMAL LOW (ref 4.22–5.81)
RDW: 16.8 % — ABNORMAL HIGH (ref 11.5–15.5)
WBC: 3.3 10*3/uL — ABNORMAL LOW (ref 4.0–10.5)
nRBC: 0.6 % — ABNORMAL HIGH (ref 0.0–0.2)

## 2020-08-06 LAB — IRON AND TIBC
Iron: 70 ug/dL (ref 45–182)
Saturation Ratios: 26 % (ref 17.9–39.5)
TIBC: 265 ug/dL (ref 250–450)
UIBC: 195 ug/dL

## 2020-08-06 LAB — RENAL FUNCTION PANEL
Albumin: 3.6 g/dL (ref 3.5–5.0)
Anion gap: 18 — ABNORMAL HIGH (ref 5–15)
BUN: 28 mg/dL — ABNORMAL HIGH (ref 8–23)
CO2: 25 mmol/L (ref 22–32)
Calcium: 8.9 mg/dL (ref 8.9–10.3)
Chloride: 96 mmol/L — ABNORMAL LOW (ref 98–111)
Creatinine, Ser: 3.63 mg/dL — ABNORMAL HIGH (ref 0.61–1.24)
GFR, Estimated: 18 mL/min — ABNORMAL LOW (ref >60)
Glucose, Bld: 80 mg/dL (ref 70–99)
Phosphorus: 2.5 mg/dL (ref 2.5–4.6)
Potassium: 2.7 mmol/L — CL (ref 3.5–5.1)
Sodium: 139 mmol/L (ref 135–145)

## 2020-08-06 LAB — POTASSIUM: Potassium: 3.5 mmol/L (ref 3.5–5.1)

## 2020-08-06 LAB — HEPATITIS B SURFACE ANTIGEN: Hepatitis B Surface Ag: NONREACTIVE

## 2020-08-06 MED ORDER — PENTAFLUOROPROP-TETRAFLUOROETH EX AERO: 1.0000 "application " | INHALATION_SPRAY | CUTANEOUS | Status: DC | PRN

## 2020-08-06 MED ORDER — SODIUM CHLORIDE 0.9 % IV SOLN: 100.0000 mL | INTRAVENOUS | Status: DC | PRN

## 2020-08-06 MED ORDER — HEPARIN SODIUM (PORCINE) 1000 UNIT/ML DIALYSIS: 1800.0000 [IU] | Freq: Once | INTRAMUSCULAR | Status: DC

## 2020-08-06 MED ORDER — LIDOCAINE HCL (PF) 1 % IJ SOLN: 5.0000 mL | INTRAMUSCULAR | Status: DC | PRN

## 2020-08-06 MED ORDER — LIDOCAINE-PRILOCAINE 2.5-2.5 % EX CREA: 1.0000 "application " | TOPICAL_CREAM | CUTANEOUS | Status: DC | PRN

## 2020-08-06 NOTE — Progress Notes (Addendum)
Subjective: Seen in room, no complaints, tolerated dialysis last night, again for dialysis today  Objective Vital signs in last 24 hours: Vitals:   08/06/20 0413 08/06/20 0428 08/06/20 0443 08/06/20 0555  BP: (!) 174/160 (!) 173/139 (!) 184/89 (!) 173/78  Pulse: 63 92 98 66  Resp: 18 15 (!) 23 18  Temp:   97.8 F (36.6 C) (!) 97.5 F (36.4 C)  TempSrc:   Oral Oral  SpO2: 100% 100% 98% 94%  Weight:      Height:       Weight change:   Physical Exam: General: An alert adult male, WD,WN in NAD Heart: Rate irregular rate in 70s, no rub, or MG Lungs: Faint Rales, nonlabored breathing room air Abdomen: Sounds normal active, soft nontender nondistended no ascites Extremities: 1+ bilateral lower extremity edema Dialysis Access: Positive bruit left Radiocephalic AV fistula   OP HD: does not have OP HD unit, released from [facility] due to behavior issues in October 2021   -- last OP HD orders from Epic from June 2021 >>   3h 29min  2/2 bath  Hep 1800 bolus  L RC AVF  Prof 4  350/800    - last ESA here was 11/09 w/ darbe 100ug   - last tsat in system was from 2019  Problem/Plan: 1.  Uremia - due to missed HD. Pt is w/o an outpatient HD unit (was released from [facility] in October due to behavior issues). Last HD was 11/24 here.   Admit BUN 137, creat 13. Tolerated HD last night with plans for today also  2. Vol overload -on admit sig cough and SOB, CXR no edema 3. ESRD - as above, no OP unit at this time. HD here.  4. Anemia - sig anemia , Hb 6.6.  On admit, this a.m. 9.7 given 1 unit packed red blood cells, Last esa was on 11/09 here, will redose and ^ to 200ug since unlikely to get esa regularly.  Iron sat 26% ferritin 815.  On p.o. iron since he does not go to outpatient HD for IV iron 5. MBD ckd - Ca 9.3, phos 9.9 , continue binders Auryxia and Sensipar 6. H/o CVA   Ernest Haber, PA-C [facility] Beeper [phone] 08/06/2020,10:07 AM  LOS: 0 days   Labs: Basic Metabolic  Panel: Recent Labs  Lab 08/02/20 0605 08/02/20 0605 08/05/20 1559 08/05/20 1959 08/06/20 0359 08/06/20 0629  NA 142  --  140  --  139  --   K 5.9*   < > 5.8*  --  2.7* 3.5  CL 100  --  100  --  96*  --   CO2 15*  --  14*  --  25  --   GLUCOSE 109*  --  106*  --  80  --   BUN 120*  --  137*  --  28*  --   CREATININE 12.92*  --  13.61*  --  3.63*  --   CALCIUM 9.4  --  9.3  --  8.9  --   PHOS  --   --   --  9.9* 2.5  --    < > = values in this interval not displayed.   Liver Function Tests: Recent Labs  Lab 08/06/20 0359  ALBUMIN 3.6   No results for input(s): LIPASE, AMYLASE in the last 168 hours. No results for input(s): AMMONIA in the last 168 hours. CBC: Recent Labs  Lab 08/02/20 0605 08/05/20 1559 08/06/20  0400  WBC 4.8 4.4 3.3*  HGB 7.4* 6.6* 9.7*  HCT 23.8* 20.4* 27.4*  MCV 108.2* 109.1* 92.3  PLT 198 163 159   Cardiac Enzymes: No results for input(s): CKTOTAL, CKMB, CKMBINDEX, TROPONINI in the last 168 hours. CBG: Recent Labs  Lab 08/05/20 2104  GLUCAP 90    Studies/Results: DG Chest Portable 1 View  Result Date: 08/05/2020 CLINICAL DATA:  Pt to triage via [place]. Has not received dialysis in 11 days because he is "in between dialysis centers". C/o generalized weakness, dizziness, dry cough, SOB, and chest burning. O2 sats 83% on room air. Pt placed on 4 liters of oxygen. EXAM: PORTABLE CHEST 1 VIEW COMPARISON:  08/02/2020 and older studies. FINDINGS: Cardiac silhouette is mildly enlarged. No mediastinal or hilar masses. Bilateral interstitial prominence is similar to the prior exam, and similar to older studies. This suggests this is all chronic. No lung consolidation. No pleural effusion or pneumothorax. Skeletal structures are grossly intact. IMPRESSION: 1. Similar appearance to the prior exams. Bilateral interstitial thickening is most likely chronic. Cannot exclude a degree superimposed interstitial edema. 2. No evidence of pneumonia. 3. Mild stable  cardiomegaly. Electronically Signed   By: Lajean Manes M.D.   On: 08/05/2020 18:29   Medications:  . amLODipine  10 mg Oral Daily  . atorvastatin  20 mg Oral Daily  . Chlorhexidine Gluconate Cloth  6 each Topical Q0600  . cinacalcet  60 mg Oral Daily  . darbepoetin (ARANESP) injection - DIALYSIS  200 mcg Intravenous Q Mon-HD  . ferric citrate  420 mg Oral TID WC  . ferrous sulfate  325 mg Oral Q breakfast  . fluticasone furoate-vilanterol  1 puff Inhalation Daily  . furosemide  40 mg Oral Once per day on Sun Mon Wed Thu Sat  . heparin  5,000 Units Subcutaneous Q8H  . hydrALAZINE  50 mg Oral TID  . metoprolol tartrate  100 mg Oral BID  . multivitamin  1 tablet Oral Daily  . sodium chloride flush  3 mL Intravenous Q12H

## 2020-08-06 NOTE — Progress Notes (Signed)
   08/06/20 0443  Hand-Off documentation  Handoff Given Given to shift RN/LPN  Report given to (Full Name) Britta Mccreedy, RN  Handoff Received Received from shift RN/LPN  Report received from (Full Name) Tarance Balan, RRN  Vitals  Temp 97.8 F (36.6 C)  Temp Source Oral  BP (!) 184/89  MAP (mmHg) 110  BP Location Right Arm  BP Method Automatic  Patient Position (if appropriate) Lying  Pulse Rate 98  Pulse Rate Source Monitor  ECG Heart Rate 95  Resp (!) 23  Oxygen Therapy  SpO2 98 %  Pain Assessment  Pain Scale 0-10  Pain Score 0  Post-Hemodialysis Assessment  Rinseback Volume (mL) 250 mL  KECN 285 V  Dialyzer Clearance Lightly streaked  Duration of HD Treatment -hour(s) 3.5 hour(s)  Hemodialysis Intake (mL) 1500 mL  UF Total -Machine (mL) 4528 mL  Net UF (mL) 3028 mL  Tolerated HD Treatment Yes  Post-Hemodialysis Comments tolerated  HD tx well,  BP has been high, had a BM,  was cleaned up and bed changesd  AVG/AVF Arterial Site Held (minutes) 10 minutes  AVG/AVF Venous Site Held (minutes) 10 minutes  Education / Care Plan  Dialysis Education Provided No (Comment)  Fistula / Graft Left Forearm Arteriovenous fistula  Placement Date/Time: 07/18/19 0807   Placed prior to admission: No  Orientation: Left  Access Location: (c) Forearm  Access Type: Arteriovenous fistula  Site Condition No complications  Fistula / Graft Assessment Present;Thrill;Bruit  Status Deaccessed  Drainage Description None

## 2020-08-06 NOTE — Progress Notes (Signed)
Initial Nutrition Assessment  DOCUMENTATION CODES:   Severe malnutrition in context of chronic illness  INTERVENTION:   Ensure Enlive po BID, each supplement provides 350 kcal and 20 grams of protein  Renal MVI  Double protein portions   NUTRITION DIAGNOSIS:   Severe Malnutrition related to chronic illness (ESRD) as evidenced by severe fat depletion, severe muscle depletion.   GOAL:   Patient will meet greater than or equal to 90% of their needs   MONITOR:   PO intake, Labs, Weight trends, Supplement acceptance  REASON FOR ASSESSMENT:   Malnutrition Screening Tool    ASSESSMENT:   Pt is a 59 y.o. male with PMH of ESRD on HD, anemia of CKD, iron deficiency anemia, asthma, HTN, HLD, GERD, Hx of GI bleed, and thoracic aortic aneurysm. Presented requesting dialysis with SOB, weakness, and nausea. Last dialysis was on 11/24 during prior hospital admission. Pt in between dialysis centers.   Spoke with pt at bedside. Pt stated he consumes 2-3 meals at day at home. Typical intake: B: oatmeal or breakfast sandwich and orange juice, L: sandwich, D: spaghetti and salad. Pt stated he tries to eat "nutritious" foods but will have an occasional Dr.Pepper once a week. Pt stated he drinks Ensure at home but could not provide frequency.  Pt stated he has experienced weight loss of 5-6 lbs. Stated his UBW is around 130 lbs. Pt unable to provide dry weight. Per chart review, pt weight has fluctuated over the past 4 months. Only one recorded weight of 62 kg since admission. Unable to quantify possible weight loss at this time but suspect significant weight loss is present.  Per RN, pt refused HD treatment today and is planned to leave AMA at noon.   Potassium was 5.8 on admission. After dialysis, it dropped to 2.7. Now it is back up to 3.5. Phorphorus was 9.9 and after HD dropped to 2.5.  Labs reviewed.  Medications reviewed and include: lasix, renal MVI, ferrous sulfate, auryxia,  sensipar   NUTRITION - FOCUSED PHYSICAL EXAM:    Most Recent Value  Orbital Region Severe depletion  Upper Arm Region Severe depletion  Thoracic and Lumbar Region Severe depletion  Buccal Region Severe depletion  Temple Region Severe depletion  Clavicle Bone Region Severe depletion  Clavicle and Acromion Bone Region Severe depletion  Scapular Bone Region Severe depletion  Dorsal Hand Severe depletion  Patellar Region Severe depletion  Anterior Thigh Region Severe depletion  Posterior Calf Region Severe depletion  Edema (RD Assessment) None  Hair Reviewed  Eyes Reviewed  Mouth Unable to assess  Skin Reviewed  Nails Reviewed        Diet Order:   Diet Order            Diet renal with fluid restriction Fluid restriction: 1200 mL Fluid; Room service appropriate? Yes; Fluid consistency: Thin  Diet effective now                 EDUCATION NEEDS:   Education needs have been addressed  Skin:  Skin Assessment: Reviewed RN Assessment  Last BM:  08/04/20  Height:   Ht Readings from Last 1 Encounters:  08/05/20 5\' 8"  (1.727 m)    Weight:   Wt Readings from Last 1 Encounters:  08/05/20 62 kg     BMI:  Body mass index is 20.78 kg/m.  Estimated Nutritional Needs:   Kcal:   1800-2100 kcals  Protein:   95-115 grams  Fluid:   1000 + UOP  Ronnald Nian, Dietetic Intern Pager: [phone] If unavailable: [phone]

## 2020-08-06 NOTE — Progress Notes (Signed)
Progress Note    Thomas Robinson  FFM:[id number] DOB: [birth year]  DOA: 08/05/2020 PCP: [name], PA    Brief Narrative:     Medical records reviewed and are as summarized below:  Thomas Robinson is an 59 y.o. male with medical history significant of ESRD on HD, CHF, anemia of CKD, iron deficiency anemia, asthma, cataracts, CVA, hypertension, hyperlipidemia, GERD, history of GI bleed, restless leg, thoracic descending aortic aneurysm who presents with worsening shortness of breath and generalized weakness.  He states he has not had dialysis for weeks and he is in between dialysis centers after having an argument with a staff member of his previous center.  He reports having weakness and shortness of breath for about the past 3 weeks.  Assessment/Plan:   Principal Problem:   Volume overload Active Problems:   Essential hypertension   Uremia   Chronic diastolic heart failure (HCC)   GERD (gastroesophageal reflux disease)   Other hyperlipidemia   Arthritis   History of CVA (cerebrovascular accident)   ESRD (end stage renal disease) on dialysis (HCC)   Symptomatic anemia   Hyperkalemia   ESRD on HD Uremia Hyperkalemia Volume overload - Appreciate nephrology recommendations - Dialysis overnight from 12/5-12/6, scheduled for another HD either later on 12/6 or overnight - Receiving a dose of Lasix in ED - Continue home Aurxia, and Sensipar - Renal diet and continue Rena-Vite -care management/social worker consult for outpatient HD as he was released from his center  Anemia -History of anemia of ESRD, iron deficiency (also history of GI bleed) -suspect hgb of 6.6 was volume diluted.   -PRBC 1 unit -hgb: 9.7  Chronic diastolic CHF Hypertension Ascending thoracic aortic aneurysm -Last echo September 2020 with EF greater than 65% and impaired relaxation -Volume managed with a combination of dialysis and Lasix on nondialysis days (he is complicated with the fact  that he does not have a home for HD) - Continue home amlodipine, hydralazine, metoprolol, lasix  CVA /Hyperlipidemia - Continue home atorvastatin  Asthma  - Continue home Breo and PRN albuterol    Family Communication/Anticipated D/C date and plan/Code Status   DVT prophylaxis: heparin Code Status: Full Code.  Disposition Plan: Status is: Observation  The patient will require care spanning > 2 midnights and should be moved to inpatient because: Unsafe d/c plan  Dispo: The patient is from: Home              Anticipated d/c is to: Home              Anticipated d/c date is: 1 day              Patient currently is not medically stable to d/c.  Needs another HD and needs outpatient HD         Medical Consultants:    nephrology  Subjective:   Says he had a dream about dying due to not having a place to go for routine HD  Objective:    Vitals:   08/06/20 0413 08/06/20 0428 08/06/20 0443 08/06/20 0555  BP: (!) 174/160 (!) 173/139 (!) 184/89 (!) 173/78  Pulse: 63 92 98 66  Resp: 18 15 (!) 23 18  Temp:   97.8 F (36.6 C) (!) 97.5 F (36.4 C)  TempSrc:   Oral Oral  SpO2: 100% 100% 98% 94%  Weight:      Height:        Intake/Output Summary (Last 24 hours) at 08/06/2020 0944 Last  data filed at 08/06/2020 0600 Gross per 24 hour  Intake 1176.67 ml  Output 3028 ml  Net -1851.33 ml   Filed Weights   08/05/20 2102  Weight: 62 kg    Exam:  General: Appearance:    Chronically ill appearing male in no acute distress     Lungs:     respirations unlabored  Heart:    Normal heart rate. Normal rhythm. No murmurs, rubs, or gallops.   MS:   All extremities are intact.   Neurologic:   Awake, alert, oriented x 3. Mildly tremulous     Data Reviewed:   I have personally reviewed following labs and imaging studies:  Labs: Labs show the following:   Basic Metabolic Panel: Recent Labs  Lab 08/02/20 0605 08/02/20 0605 08/05/20 1559 08/05/20 1559 08/05/20 1959  08/06/20 0359 08/06/20 0629  NA 142  --  140  --   --  139  --   K 5.9*   < > 5.8*   < >  --  2.7* 3.5  CL 100  --  100  --   --  96*  --   CO2 15*  --  14*  --   --  25  --   GLUCOSE 109*  --  106*  --   --  80  --   BUN 120*  --  137*  --   --  28*  --   CREATININE 12.92*  --  13.61*  --   --  3.63*  --   CALCIUM 9.4  --  9.3  --   --  8.9  --   PHOS  --   --   --   --  9.9* 2.5  --    < > = values in this interval not displayed.   GFR Estimated Creatinine Clearance: 18.7 mL/min (A) (by C-G formula based on SCr of 3.63 mg/dL (H)). Liver Function Tests: Recent Labs  Lab 08/06/20 0359  ALBUMIN 3.6   No results for input(s): LIPASE, AMYLASE in the last 168 hours. No results for input(s): AMMONIA in the last 168 hours. Coagulation profile No results for input(s): INR, PROTIME in the last 168 hours.  CBC: Recent Labs  Lab 08/02/20 0605 08/05/20 1559 08/06/20 0400  WBC 4.8 4.4 3.3*  HGB 7.4* 6.6* 9.7*  HCT 23.8* 20.4* 27.4*  MCV 108.2* 109.1* 92.3  PLT 198 163 159   Cardiac Enzymes: No results for input(s): CKTOTAL, CKMB, CKMBINDEX, TROPONINI in the last 168 hours. BNP (last 3 results) No results for input(s): PROBNP in the last 8760 hours. CBG: Recent Labs  Lab 08/05/20 2104  GLUCAP 90   D-Dimer: No results for input(s): DDIMER in the last 72 hours. Hgb A1c: No results for input(s): HGBA1C in the last 72 hours. Lipid Profile: No results for input(s): CHOL, HDL, LDLCALC, TRIG, CHOLHDL, LDLDIRECT in the last 72 hours. Thyroid function studies: No results for input(s): TSH, T4TOTAL, T3FREE, THYROIDAB in the last 72 hours.  Invalid input(s): FREET3 Anemia work up: Recent Labs    08/05/20 1959 08/06/20 0400  FERRITIN 815*  --   TIBC 255 265  IRON 84 70   Sepsis Labs: Recent Labs  Lab 08/02/20 0605 08/05/20 1559 08/06/20 0400  WBC 4.8 4.4 3.3*    Microbiology Recent Results (from the past 240 hour(s))  Resp Panel by RT-PCR (Flu A&B, Covid)  Nasopharyngeal Swab     Status: None   Collection Time: 08/05/20  7:59 PM  Specimen: Nasopharyngeal Swab; Nasopharyngeal(NP) swabs in vial transport medium  Result Value Ref Range Status   SARS Coronavirus 2 by RT PCR NEGATIVE NEGATIVE Final    Comment: (NOTE) SARS-CoV-2 target nucleic acids are NOT DETECTED.  The SARS-CoV-2 RNA is generally detectable in upper respiratory specimens during the acute phase of infection. The lowest concentration of SARS-CoV-2 viral copies this assay can detect is 138 copies/mL. A negative result does not preclude SARS-Cov-2 infection and should not be used as the sole basis for treatment or other patient management decisions. A negative result may occur with  improper specimen collection/handling, submission of specimen other than nasopharyngeal swab, presence of viral mutation(s) within the areas targeted by this assay, and inadequate number of viral copies(<138 copies/mL). A negative result must be combined with clinical observations, patient history, and epidemiological information. The expected result is Negative.  Fact Sheet for Patients:  EntrepreneurPulse.com.au  Fact Sheet for Healthcare Providers:  IncredibleEmployment.be  This test is no t yet approved or cleared by the [place] FDA and  has been authorized for detection and/or diagnosis of SARS-CoV-2 by FDA under an Emergency Use Authorization (EUA). This EUA will remain  in effect (meaning this test can be used) for the duration of the COVID-19 declaration under Section 564(b)(1) of the Act, 21 U.S.C.section 360bbb-3(b)(1), unless the authorization is terminated  or revoked sooner.       Influenza A by PCR NEGATIVE NEGATIVE Final   Influenza B by PCR NEGATIVE NEGATIVE Final    Comment: (NOTE) The Xpert Xpress SARS-CoV-2/FLU/RSV plus assay is intended as an aid in the diagnosis of influenza from Nasopharyngeal swab specimens and should not be  used as a sole basis for treatment. Nasal washings and aspirates are unacceptable for Xpert Xpress SARS-CoV-2/FLU/RSV testing.  Fact Sheet for Patients: EntrepreneurPulse.com.au  Fact Sheet for Healthcare Providers: IncredibleEmployment.be  This test is not yet approved or cleared by the [place] FDA and has been authorized for detection and/or diagnosis of SARS-CoV-2 by FDA under an Emergency Use Authorization (EUA). This EUA will remain in effect (meaning this test can be used) for the duration of the COVID-19 declaration under Section 564(b)(1) of the Act, 21 U.S.C. section 360bbb-3(b)(1), unless the authorization is terminated or revoked.  Performed at [facility], [place] [address] [place], [place] [id number]     Procedures and diagnostic studies:  DG Chest Portable 1 View  Result Date: 08/05/2020 CLINICAL DATA:  Pt to triage via [place]. Has not received dialysis in 11 days because he is "in between dialysis centers". C/o generalized weakness, dizziness, dry cough, SOB, and chest burning. O2 sats 83% on room air. Pt placed on 4 liters of oxygen. EXAM: PORTABLE CHEST 1 VIEW COMPARISON:  08/02/2020 and older studies. FINDINGS: Cardiac silhouette is mildly enlarged. No mediastinal or hilar masses. Bilateral interstitial prominence is similar to the prior exam, and similar to older studies. This suggests this is all chronic. No lung consolidation. No pleural effusion or pneumothorax. Skeletal structures are grossly intact. IMPRESSION: 1. Similar appearance to the prior exams. Bilateral interstitial thickening is most likely chronic. Cannot exclude a degree superimposed interstitial edema. 2. No evidence of pneumonia. 3. Mild stable cardiomegaly. Electronically Signed   By: Lajean Manes M.D.   On: 08/05/2020 18:29    Medications:   . amLODipine  10 mg Oral Daily  . atorvastatin  20 mg Oral Daily  . Chlorhexidine Gluconate Cloth  6 each  Topical Q0600  . cinacalcet  60 mg  Oral Daily  . darbepoetin (ARANESP) injection - DIALYSIS  200 mcg Intravenous Q Mon-HD  . ferric citrate  420 mg Oral TID WC  . ferrous sulfate  325 mg Oral Q breakfast  . fluticasone furoate-vilanterol  1 puff Inhalation Daily  . furosemide  40 mg Oral Once per day on Sun Mon Wed Thu Sat  . heparin  5,000 Units Subcutaneous Q8H  . hydrALAZINE  50 mg Oral TID  . metoprolol tartrate  100 mg Oral BID  . multivitamin  1 tablet Oral Daily  . sodium chloride flush  3 mL Intravenous Q12H   Continuous Infusions:   LOS: 0 days   Geradine Girt  [facility]   How to contact the [facility] Attending or Consulting provider [place] or covering provider during after hours [place], for this patient?  1. Check the care team in [facility] and look for a) attending/consulting [facility] provider listed and b) the [facility] team listed 2. Log into [email or web address] and use [place]'s universal password to access. If you do not have the password, please contact the hospital operator. 3. Locate the [facility] provider you are looking for under [facility] and page to a number that you can be directly reached. 4. If you still have difficulty reaching the provider, please page the [facility] (Director on Call) for the Hospitalists listed on amion for assistance.  08/06/2020, 9:44 AM

## 2020-08-06 NOTE — Progress Notes (Signed)
Received report from Hemodialysis RN Obas, patient potassium is 2.7. RN Obas stated he put a reorder for lab drawn of potassium. On call night provider  MD Shalhoub made aware. We'll wait for the repeat lab drawn before addressing the issue per MD.

## 2020-08-07 DIAGNOSIS — N186 End stage renal disease: Secondary | ICD-10-CM | POA: Diagnosis not present

## 2020-08-07 DIAGNOSIS — I5032 Chronic diastolic (congestive) heart failure: Secondary | ICD-10-CM | POA: Diagnosis not present

## 2020-08-07 DIAGNOSIS — Z9115 Patient's noncompliance with renal dialysis: Secondary | ICD-10-CM | POA: Diagnosis not present

## 2020-08-07 DIAGNOSIS — E8779 Other fluid overload: Secondary | ICD-10-CM | POA: Diagnosis not present

## 2020-08-07 LAB — BPAM RBC
Blood Product Expiration Date: 202201022359
Blood Product Expiration Date: 202201022359
ISSUE DATE / TIME: 202112060150
ISSUE DATE / TIME: 202112060150
Unit Type and Rh: 6200
Unit Type and Rh: 6200

## 2020-08-07 LAB — TYPE AND SCREEN
ABO/RH(D): A POS
Antibody Screen: NEGATIVE
Unit division: 0
Unit division: 0

## 2020-08-07 LAB — HEPATITIS B SURFACE ANTIBODY, QUANTITATIVE: Hep B S AB Quant (Post): <3.1 m[IU]/mL — ABNORMAL LOW (ref >9.9)

## 2020-08-07 MED ORDER — CHLORHEXIDINE GLUCONATE CLOTH 2 % EX PADS: 6.0000 | MEDICATED_PAD | Freq: Every day | CUTANEOUS | Status: DC

## 2020-08-07 MED ORDER — DARBEPOETIN ALFA 200 MCG/0.4ML IJ SOSY
200.0000 ug | PREFILLED_SYRINGE | Freq: Once | INTRAMUSCULAR | Status: DC
  Filled 2020-08-07: qty 0.4

## 2020-08-07 NOTE — Progress Notes (Signed)
Subjective: Feels better tolerated dialysis yesterday  Objective Vital signs in last 24 hours: Vitals:   08/06/20 2240 08/06/20 2340 08/07/20 0527 08/07/20 0948  BP: (!) 151/75 (!) 163/90 (!) 177/96 (!) 141/113  Pulse: (!) 57 65 77 68  Resp:  18 18 18   Temp:  98.2 F (36.8 C) 99.5 F (37.5 C) 98.2 F (36.8 C)  TempSrc:  Oral Oral Oral  SpO2:  97% 95% 91%  Weight:      Height:       Weight change:   Physical Exam: General: An alert adult male, WD,WN in NAD Heart: RRR, no rub, or MG Lungs:  CTA  nonlabored breathing room air Abdomen: Sounds normal active, soft nontender nondistended no ascites Extremities: No pedal edema  Dialysis Access: Positive bruit left Radiocephalic AV fistula  OP FY:BOFB not have OP HD unit, released from [facility] due to behavior issues in October 2021 -- last OP HD orders from Epic from June 2021 >> 3h 65min 2/2 bath Hep 1800 bolus L RC AVF Prof 4 350/800  - last ESA here was 11/09 w/ darbe 100ug - last tsat in system was from 2019  Problem/Plan: 1.  Uremia - due to missed HD. Pt isw/o an outpatient HD unit (was released from [facility] in October due to behavior issues). Last HDwas11/24 here.  Admit BUN 137, creat 13.  Hemodialysis yesterday tolerated yesterday labs predialysis BUN 28 serum creatinine 3.6 3, K3.5 all reflecting labs after having dialysis prior evening Tolerated HD last night with plans for today also  2. Vol overload-on admit sig cough and SOB, CXR no edema now resolved after dialysis 3. ESRD - as above, no OP unit at this time.HD here.  Difficult situation with no outpatient dialysis center currently 4. Anemia - sig anemia , Hb 6.6.  On admit, this a.m. 9.7 given 1 unit packed red blood cells,Last esa was on 11/09 here, will redose and ^ to 200ug since unlikely to get esa regularly.  Iron sat 26% ferritin 815.  On p.o. iron since he does not go to outpatient HD for IV iron 5. MBD ckd - Ca 8.9, phos 9.9> 2.5, continue  binders Auryxia and Sensipar 6. H/o CVA  Ernest Haber, PA-C [facility] [phone] 08/07/2020,10:34 AM  LOS: 1 day   Labs: Basic Metabolic Panel: Recent Labs  Lab 08/02/20 0605 08/02/20 0605 08/05/20 1559 08/05/20 1959 08/06/20 0359 08/06/20 0629  NA 142  --  140  --  139  --   K 5.9*   < > 5.8*  --  2.7* 3.5  CL 100  --  100  --  96*  --   CO2 15*  --  14*  --  25  --   GLUCOSE 109*  --  106*  --  80  --   BUN 120*  --  137*  --  28*  --   CREATININE 12.92*  --  13.61*  --  3.63*  --   CALCIUM 9.4  --  9.3  --  8.9  --   PHOS  --   --   --  9.9* 2.5  --    < > = values in this interval not displayed.   Liver Function Tests: Recent Labs  Lab 08/06/20 0359  ALBUMIN 3.6   No results for input(s): LIPASE, AMYLASE in the last 168 hours. No results for input(s): AMMONIA in the last 168 hours. CBC: Recent Labs  Lab 08/02/20 0605 08/05/20 1559  08/06/20 0400  WBC 4.8 4.4 3.3*  HGB 7.4* 6.6* 9.7*  HCT 23.8* 20.4* 27.4*  MCV 108.2* 109.1* 92.3  PLT 198 163 159   Cardiac Enzymes: No results for input(s): CKTOTAL, CKMB, CKMBINDEX, TROPONINI in the last 168 hours. CBG: Recent Labs  Lab 08/05/20 2104  GLUCAP 90    Studies/Results: DG Chest Portable 1 View  Result Date: 08/05/2020 CLINICAL DATA:  Pt to triage via [place]. Has not received dialysis in 11 days because he is "in between dialysis centers". C/o generalized weakness, dizziness, dry cough, SOB, and chest burning. O2 sats 83% on room air. Pt placed on 4 liters of oxygen. EXAM: PORTABLE CHEST 1 VIEW COMPARISON:  08/02/2020 and older studies. FINDINGS: Cardiac silhouette is mildly enlarged. No mediastinal or hilar masses. Bilateral interstitial prominence is similar to the prior exam, and similar to older studies. This suggests this is all chronic. No lung consolidation. No pleural effusion or pneumothorax. Skeletal structures are grossly intact. IMPRESSION: 1. Similar appearance to the prior  exams. Bilateral interstitial thickening is most likely chronic. Cannot exclude a degree superimposed interstitial edema. 2. No evidence of pneumonia. 3. Mild stable cardiomegaly. Electronically Signed   By: Lajean Manes M.D.   On: 08/05/2020 18:29   Medications:  . amLODipine  10 mg Oral Daily  . atorvastatin  20 mg Oral Daily  . Chlorhexidine Gluconate Cloth  6 each Topical Q0600  . Chlorhexidine Gluconate Cloth  6 each Topical Q0600  . cinacalcet  60 mg Oral Daily  . darbepoetin (ARANESP) injection - DIALYSIS  200 mcg Intravenous Q Mon-HD  . ferric citrate  420 mg Oral TID WC  . ferrous sulfate  325 mg Oral Q breakfast  . fluticasone furoate-vilanterol  1 puff Inhalation Daily  . furosemide  40 mg Oral Once per day on Sun Mon Wed Thu Sat  . heparin  5,000 Units Subcutaneous Q8H  . hydrALAZINE  50 mg Oral TID  . metoprolol tartrate  100 mg Oral BID  . multivitamin  1 tablet Oral Daily  . sodium chloride flush  3 mL Intravenous Q12H

## 2020-08-07 NOTE — Progress Notes (Signed)
Patient refused to go to HD, he said that he couldn't do dialysis three days in a row and that he is going home at 12noon.

## 2020-08-07 NOTE — Discharge Summary (Signed)
Physician Discharge Summary  Thomas Robinson WUJ:[id number] DOB: [birth year] DOA: 08/05/2020  PCP: [name], PA  Admit date: 08/05/2020 Discharge date: 08/07/2020  Admitted From: home Discharge disposition: home   Recommendations for Outpatient Follow-Up:   1. Continue HD PNR via ER until home HD Center found   Discharge Diagnosis:   Principal Problem:   Volume overload Active Problems:   Essential hypertension   Uremia   Chronic diastolic heart failure (HCC)   GERD (gastroesophageal reflux disease)   Other hyperlipidemia   Arthritis   History of CVA (cerebrovascular accident)   ESRD (end stage renal disease) on dialysis (HCC)   Symptomatic anemia   Hyperkalemia    Discharge Condition: Improved.  Diet recommendation: renal diet  Wound care: None.  Code status: Full.   History of Present Illness:   Thomas Robinson is a 59 y.o. male with medical history significant of ESRD on HD, CHF, anemia of CKD, iron deficiency anemia, asthma, cataracts, CVA, hypertension, hyperlipidemia, GERD, history of GI bleed, restless leg, thoracic descending aortic aneurysm who presents with worsening shortness of breath and generalized weakness.             He states he has not had dialysis for weeks and he is in between dialysis centers after having an argument with a staff member of his previous center.  He reports having weakness and shortness of breath for about the past 3 weeks.  Today he reports he had an episode where he stood up and his vision was blurry temporarily.  He also endorses nausea for the past day. He denies fever, abdominal pain, diarrhea, changes in stool.  Chart review shows that he has been seen in the ED for dialysis recently.  He was last dialyzed on 11/24. He was in the ED for dialysis on 11/2 but left due to the long wait for HD.   Hospital Course by Problem:   ESRD on HD Uremia Hyperkalemia Volume overload -Appreciate nephrology  recommendations -s/p 2 HDs-- refused 3rd HD -Continue homeAurxia,and Sensipar -Renal diet and continue Rena-Vite -care management/social worker consult for outpatient HD as he was released from his center  Anemia -History of anemia of ESRD,iron deficiency (also history of GI bleed) -suspect hgb of 6.6 was volume diluted.   -PRBC 1 unit -hgb: 9.7  Chronic diastolic CHF Hypertension Ascending thoracic aortic aneurysm -Last echo September 2020 with EF greater than 65% and impaired relaxation -Volumemanaged with acombination of dialysis and Lasix on nondialysis days (he is complicated with the fact that he does not have a home for HD) -Continue home amlodipine, hydralazine, metoprolol, lasix  CVA/Hyperlipidemia -Continue homeatorvastatin  Asthma -Continue home Breo and PRN albuterol    Medical Consultants:   renal   Discharge Exam:   Vitals:   08/07/20 0527 08/07/20 0948  BP: (!) 177/96 (!) 141/113  Pulse: 77 68  Resp: 18 18  Temp: 99.5 F (37.5 C) 98.2 F (36.8 C)  SpO2: 95% 91%   Vitals:   08/06/20 2240 08/06/20 2340 08/07/20 0527 08/07/20 0948  BP: (!) 151/75 (!) 163/90 (!) 177/96 (!) 141/113  Pulse: (!) 57 65 77 68  Resp:  18 18 18   Temp:  98.2 F (36.8 C) 99.5 F (37.5 C) 98.2 F (36.8 C)  TempSrc:  Oral Oral Oral  SpO2:  97% 95% 91%  Weight:      Height:        General exam: Appears calm and comfortable.   The  results of significant diagnostics from this hospitalization (including imaging, microbiology, ancillary and laboratory) are listed below for reference.     Procedures and Diagnostic Studies:   DG Chest Portable 1 View  Result Date: 08/05/2020 CLINICAL DATA:  Pt to triage via [place]. Has not received dialysis in 11 days because he is "in between dialysis centers". C/o generalized weakness, dizziness, dry cough, SOB, and chest burning. O2 sats 83% on room air. Pt placed on 4 liters of oxygen. EXAM: PORTABLE CHEST 1 VIEW  COMPARISON:  08/02/2020 and older studies. FINDINGS: Cardiac silhouette is mildly enlarged. No mediastinal or hilar masses. Bilateral interstitial prominence is similar to the prior exam, and similar to older studies. This suggests this is all chronic. No lung consolidation. No pleural effusion or pneumothorax. Skeletal structures are grossly intact. IMPRESSION: 1. Similar appearance to the prior exams. Bilateral interstitial thickening is most likely chronic. Cannot exclude a degree superimposed interstitial edema. 2. No evidence of pneumonia. 3. Mild stable cardiomegaly. Electronically Signed   By: Lajean Manes M.D.   On: 08/05/2020 18:29     Labs:   Basic Metabolic Panel: Recent Labs  Lab 08/02/20 0605 08/02/20 0605 08/05/20 1559 08/05/20 1559 08/05/20 1959 08/06/20 0359 08/06/20 0629  NA 142  --  140  --   --  139  --   K 5.9*   < > 5.8*   < >  --  2.7* 3.5  CL 100  --  100  --   --  96*  --   CO2 15*  --  14*  --   --  25  --   GLUCOSE 109*  --  106*  --   --  80  --   BUN 120*  --  137*  --   --  28*  --   CREATININE 12.92*  --  13.61*  --   --  3.63*  --   CALCIUM 9.4  --  9.3  --   --  8.9  --   PHOS  --   --   --   --  9.9* 2.5  --    < > = values in this interval not displayed.   GFR Estimated Creatinine Clearance: 18.7 mL/min (A) (by C-G formula based on SCr of 3.63 mg/dL (H)). Liver Function Tests: Recent Labs  Lab 08/06/20 0359  ALBUMIN 3.6   No results for input(s): LIPASE, AMYLASE in the last 168 hours. No results for input(s): AMMONIA in the last 168 hours. Coagulation profile No results for input(s): INR, PROTIME in the last 168 hours.  CBC: Recent Labs  Lab 08/02/20 0605 08/05/20 1559 08/06/20 0400  WBC 4.8 4.4 3.3*  HGB 7.4* 6.6* 9.7*  HCT 23.8* 20.4* 27.4*  MCV 108.2* 109.1* 92.3  PLT 198 163 159   Cardiac Enzymes: No results for input(s): CKTOTAL, CKMB, CKMBINDEX, TROPONINI in the last 168 hours. BNP: Invalid input(s): POCBNP CBG: Recent  Labs  Lab 08/05/20 2104  GLUCAP 90   D-Dimer No results for input(s): DDIMER in the last 72 hours. Hgb A1c No results for input(s): HGBA1C in the last 72 hours. Lipid Profile No results for input(s): CHOL, HDL, LDLCALC, TRIG, CHOLHDL, LDLDIRECT in the last 72 hours. Thyroid function studies No results for input(s): TSH, T4TOTAL, T3FREE, THYROIDAB in the last 72 hours.  Invalid input(s): FREET3 Anemia work up Recent Labs    08/05/20 1959 08/06/20 0400  FERRITIN 815*  --   TIBC 255 265  IRON 84  72   Microbiology Recent Results (from the past 240 hour(s))  Resp Panel by RT-PCR (Flu A&B, Covid) Nasopharyngeal Swab     Status: None   Collection Time: 08/05/20  7:59 PM   Specimen: Nasopharyngeal Swab; Nasopharyngeal(NP) swabs in vial transport medium  Result Value Ref Range Status   SARS Coronavirus 2 by RT PCR NEGATIVE NEGATIVE Final    Comment: (NOTE) SARS-CoV-2 target nucleic acids are NOT DETECTED.  The SARS-CoV-2 RNA is generally detectable in upper respiratory specimens during the acute phase of infection. The lowest concentration of SARS-CoV-2 viral copies this assay can detect is 138 copies/mL. A negative result does not preclude SARS-Cov-2 infection and should not be used as the sole basis for treatment or other patient management decisions. A negative result may occur with  improper specimen collection/handling, submission of specimen other than nasopharyngeal swab, presence of viral mutation(s) within the areas targeted by this assay, and inadequate number of viral copies(<138 copies/mL). A negative result must be combined with clinical observations, patient history, and epidemiological information. The expected result is Negative.  Fact Sheet for Patients:  EntrepreneurPulse.com.au  Fact Sheet for Healthcare Providers:  IncredibleEmployment.be  This test is no t yet approved or cleared by the [place] FDA and  has  been authorized for detection and/or diagnosis of SARS-CoV-2 by FDA under an Emergency Use Authorization (EUA). This EUA will remain  in effect (meaning this test can be used) for the duration of the COVID-19 declaration under Section 564(b)(1) of the Act, 21 U.S.C.section 360bbb-3(b)(1), unless the authorization is terminated  or revoked sooner.       Influenza A by PCR NEGATIVE NEGATIVE Final   Influenza B by PCR NEGATIVE NEGATIVE Final    Comment: (NOTE) The Xpert Xpress SARS-CoV-2/FLU/RSV plus assay is intended as an aid in the diagnosis of influenza from Nasopharyngeal swab specimens and should not be used as a sole basis for treatment. Nasal washings and aspirates are unacceptable for Xpert Xpress SARS-CoV-2/FLU/RSV testing.  Fact Sheet for Patients: EntrepreneurPulse.com.au  Fact Sheet for Healthcare Providers: IncredibleEmployment.be  This test is not yet approved or cleared by the [place] FDA and has been authorized for detection and/or diagnosis of SARS-CoV-2 by FDA under an Emergency Use Authorization (EUA). This EUA will remain in effect (meaning this test can be used) for the duration of the COVID-19 declaration under Section 564(b)(1) of the Act, 21 U.S.C. section 360bbb-3(b)(1), unless the authorization is terminated or revoked.  Performed at [facility], [place] [address] [place], [place] [address]      Discharge Instructions:   Discharge Instructions    Increase activity slowly   Complete by: As directed      Allergies as of 08/07/2020   No Known Allergies     Medication List    STOP taking these medications   carvedilol 12.5 MG tablet Commonly known as: COREG     TAKE these medications   albuterol 108 (90 Base) MCG/ACT inhaler Commonly known as: VENTOLIN HFA Inhale 2 puffs into the lungs every 6 (six) hours as needed for wheezing or shortness of breath.   amLODipine 10 MG tablet Commonly known  as: NORVASC Take 10 mg by mouth daily.   atorvastatin 20 MG tablet Commonly known as: LIPITOR Take 20 mg by mouth daily.   Auryxia 1 GM 210 MG(Fe) tablet Generic drug: ferric citrate Take 420 mg by mouth 3 (three) times daily with meals.   Breo Ellipta 100-25 MCG/INH Aepb Generic drug: fluticasone  furoate-vilanterol Inhale 1 puff into the lungs daily.   cholecalciferol 25 MCG (1000 UNIT) tablet Commonly known as: VITAMIN D3 Take 1,000 Units by mouth daily.   furosemide 40 MG tablet Commonly known as: LASIX Take 40 mg of Lasix on non-dialysis days. What changed:   how much to take  how to take this  when to take this  additional instructions   gabapentin 300 MG capsule Commonly known as: NEURONTIN Take 300 mg by mouth 2 (two) times daily as needed (pain).   HEALTHY HEART COMPLEX PO Take 1 tablet by mouth daily.   hydrALAZINE 25 MG tablet Commonly known as: APRESOLINE Take 50 mg by mouth 3 (three) times daily.   metoprolol tartrate 100 MG tablet Commonly known as: LOPRESSOR Take 100 mg by mouth 2 (two) times daily.   multivitamin Tabs tablet Take 1 tablet by mouth daily.   Renvela 800 MG tablet Generic drug: sevelamer carbonate Take 800 mg by mouth daily.       Follow-up Information    [facility], [place], [place] Follow up in 1 day(s).   Specialty: Physician Assistant Why: needs an outpatient HD center Contact information: [place] [address] [phone]        [name], DO .   Specialty: Cardiology Contact information: [place] [address] [phone]                Time coordinating discharge: 35 min  Signed:  Geradine Girt DO  [facility] 08/07/2020, 4:53 PM

## 2020-08-07 NOTE — Progress Notes (Signed)
DISCHARGE NOTE HOME [name] to be discharged Home per MD order. Discussed prescriptions and follow up appointments with the patient. Prescriptions given to patient; medication list explained in detail. Patient verbalized understanding.  Skin clean, dry and intact without evidence of skin break down, no evidence of skin tears noted. IV catheter discontinued intact. Site without signs and symptoms of complications. Dressing and pressure applied. Pt denies pain at the site currently. No complaints noted.  Patient free of lines, drains, and wounds.   An After Visit Summary (AVS) was printed and given to the patient. Patient escorted via wheelchair, and discharged home via private auto.  Orville Govern, RN3

## 2020-08-13 ENCOUNTER — Emergency Department (HOSPITAL_COMMUNITY): Payer: Medicare Other

## 2020-08-13 ENCOUNTER — Other Ambulatory Visit: Payer: Self-pay

## 2020-08-13 ENCOUNTER — Emergency Department (HOSPITAL_COMMUNITY)
Admission: EM | Admit: 2020-08-13 | Discharge: 2020-08-13 | Discharge status: Against Medical Advice | Payer: Medicare Other | Attending: Emergency Medicine | Admitting: Emergency Medicine

## 2020-08-13 DIAGNOSIS — N185 Chronic kidney disease, stage 5: Secondary | ICD-10-CM | POA: Diagnosis not present

## 2020-08-13 DIAGNOSIS — E8770 Fluid overload, unspecified: Secondary | ICD-10-CM | POA: Diagnosis not present

## 2020-08-13 DIAGNOSIS — I120 Hypertensive chronic kidney disease with stage 5 chronic kidney disease or end stage renal disease: Secondary | ICD-10-CM | POA: Insufficient documentation

## 2020-08-13 DIAGNOSIS — R0602 Shortness of breath: Secondary | ICD-10-CM | POA: Insufficient documentation

## 2020-08-13 DIAGNOSIS — E875 Hyperkalemia: Secondary | ICD-10-CM

## 2020-08-13 DIAGNOSIS — Z992 Dependence on renal dialysis: Secondary | ICD-10-CM

## 2020-08-13 DIAGNOSIS — I5032 Chronic diastolic (congestive) heart failure: Secondary | ICD-10-CM | POA: Diagnosis not present

## 2020-08-13 DIAGNOSIS — Z5321 Procedure and treatment not carried out due to patient leaving prior to being seen by health care provider: Secondary | ICD-10-CM | POA: Diagnosis not present

## 2020-08-13 DIAGNOSIS — Z20822 Contact with and (suspected) exposure to covid-19: Secondary | ICD-10-CM | POA: Insufficient documentation

## 2020-08-13 DIAGNOSIS — Z981 Arthrodesis status: Secondary | ICD-10-CM | POA: Insufficient documentation

## 2020-08-13 DIAGNOSIS — Z79899 Other long term (current) drug therapy: Secondary | ICD-10-CM | POA: Diagnosis not present

## 2020-08-13 DIAGNOSIS — I110 Hypertensive heart disease with heart failure: Secondary | ICD-10-CM | POA: Diagnosis not present

## 2020-08-13 DIAGNOSIS — Z8673 Personal history of transient ischemic attack (TIA), and cerebral infarction without residual deficits: Secondary | ICD-10-CM | POA: Diagnosis not present

## 2020-08-13 LAB — RESP PANEL BY RT-PCR (FLU A&B, COVID) ARPGX2
Influenza A by PCR: NEGATIVE
Influenza B by PCR: NEGATIVE
SARS Coronavirus 2 by RT PCR: NEGATIVE

## 2020-08-13 LAB — BASIC METABOLIC PANEL
Anion gap: 20 — ABNORMAL HIGH (ref 5–15)
BUN: 116 mg/dL — ABNORMAL HIGH (ref 8–23)
CO2: 18 mmol/L — ABNORMAL LOW (ref 22–32)
Calcium: 9.4 mg/dL (ref 8.9–10.3)
Chloride: 100 mmol/L (ref 98–111)
Creatinine, Ser: 11.39 mg/dL — ABNORMAL HIGH (ref 0.61–1.24)
GFR, Estimated: 5 mL/min — ABNORMAL LOW (ref >60)
Glucose, Bld: 94 mg/dL (ref 70–99)
Potassium: 5.3 mmol/L — ABNORMAL HIGH (ref 3.5–5.1)
Sodium: 138 mmol/L (ref 135–145)

## 2020-08-13 LAB — CBC WITH DIFFERENTIAL/PLATELET
Abs Immature Granulocytes: 0.02 10*3/uL (ref 0.00–0.07)
Basophils Absolute: 0.1 10*3/uL (ref 0.0–0.1)
Basophils Relative: 1 %
Eosinophils Absolute: 0.4 10*3/uL (ref 0.0–0.5)
Eosinophils Relative: 7 %
HCT: 25.4 % — ABNORMAL LOW (ref 39.0–52.0)
Hemoglobin: 8.2 g/dL — ABNORMAL LOW (ref 13.0–17.0)
Immature Granulocytes: 0 %
Lymphocytes Relative: 6 %
Lymphs Abs: 0.4 10*3/uL — ABNORMAL LOW (ref 0.7–4.0)
MCH: 32.2 pg (ref 26.0–34.0)
MCHC: 32.3 g/dL (ref 30.0–36.0)
MCV: 99.6 fL (ref 80.0–100.0)
Monocytes Absolute: 0.5 10*3/uL (ref 0.1–1.0)
Monocytes Relative: 7 %
Neutro Abs: 5.2 10*3/uL (ref 1.7–7.7)
Neutrophils Relative %: 79 %
Platelets: 129 10*3/uL — ABNORMAL LOW (ref 150–400)
RBC: 2.55 MIL/uL — ABNORMAL LOW (ref 4.22–5.81)
RDW: 16.5 % — ABNORMAL HIGH (ref 11.5–15.5)
WBC: 6.6 10*3/uL (ref 4.0–10.5)
nRBC: 0 % (ref 0.0–0.2)

## 2020-08-13 MED ORDER — SODIUM CHLORIDE 0.9 % IV SOLN: 100.0000 mL | INTRAVENOUS | Status: DC | PRN

## 2020-08-13 MED ORDER — LIDOCAINE-PRILOCAINE 2.5-2.5 % EX CREA
1.0000 "application " | TOPICAL_CREAM | CUTANEOUS | Status: DC | PRN
  Filled 2020-08-13: qty 5

## 2020-08-13 MED ORDER — PENTAFLUOROPROP-TETRAFLUOROETH EX AERO
1.0000 "application " | INHALATION_SPRAY | CUTANEOUS | Status: DC | PRN
  Filled 2020-08-13: qty 116

## 2020-08-13 MED ORDER — CHLORHEXIDINE GLUCONATE CLOTH 2 % EX PADS: 6.0000 | MEDICATED_PAD | Freq: Every day | CUTANEOUS | Status: DC

## 2020-08-13 MED ORDER — LIDOCAINE HCL (PF) 1 % IJ SOLN: 5.0000 mL | INTRAMUSCULAR | Status: DC | PRN

## 2020-08-13 MED ORDER — ALTEPLASE 2 MG IJ SOLR: 2.0000 mg | Freq: Once | INTRAMUSCULAR | Status: DC | PRN

## 2020-08-13 MED ORDER — HEPARIN SODIUM (PORCINE) 1000 UNIT/ML DIALYSIS
1000.0000 [IU] | INTRAMUSCULAR | Status: DC | PRN
  Filled 2020-08-13: qty 1

## 2020-08-13 NOTE — ED Notes (Signed)
Patient in room on monitor, has complaints of SOB, currently on 5L [facility] at 93%.   Waiting for dialysis.   L AVF

## 2020-08-13 NOTE — Progress Notes (Addendum)
CSW spoke with Aldona Bar, RN regarding this patient's request for a new HD center.  CSW spoke with Jaclyn Shaggy, Renal Navigator regarding this patient - Jaclyn Shaggy reports this patient was discharged from his HD center due to behavioral issues. This patient is to receive HD treatments in the ED until further notice.  Madilyn Fireman, MSW, LCSW-A Transitions of Care  Clinical Social Worker [facility] Emergency Departments  Medical ICU [phone]

## 2020-08-13 NOTE — ED Notes (Signed)
Patient stating "get me a wheel chair im getting out of here" this RN tried to explain to the patient the he just got back from dialysis and the provider needed to come see him first. Patient not happy with this. Patient proceeded to get up and walk down the hallway. Provider notified.

## 2020-08-13 NOTE — ED Triage Notes (Signed)
Patient arrived via [facility] from home has not dialysis since 08/06/2020. C/O SOB that started yesterday. Per EMS patient was 67% on RA and they placed him on NRB 15 L and it came up to 100%. EMS VS- 200/100, R-36, H-80 CBG-136 Patient is not vaccinated.

## 2020-08-13 NOTE — ED Provider Notes (Signed)
Medical Decision Making: Care of patient assumed from Dr. Stark Jock at 0700.  Agree with history, physical exam and plan.  See their note for further details.  Briefly, The pt p/w hypertension, hypoxia secondary to fluid overload, has a history of end-stage renal disease however is noncompliant with dialysis, comes in today with mild hyperkalemia.  Mild acidosis.  Nephrology is consulted for dialysis and further assessment..   Current plan is as follows: Dialyze patient reassess for possible discharge.  This patient remained stable on current oxygen therapy, nephrology has been by to assess this patient agrees he does need to be dialyzed here in the hospital as due to social reasons he does not have other options for dialysis.  They will dialyze him and send him home if he is stable for admit as needed.  They state this will likely happen this afternoon or evening.  I personally reviewed and interpreted all labs/imaging.      Breck Coons, MD 08/13/20 1135

## 2020-08-13 NOTE — ED Provider Notes (Signed)
[facility] EMERGENCY DEPARTMENT Provider Note   CSN: [id number] Arrival date & time: 08/13/20  0200     History Chief Complaint  Patient presents with  . Shortness of Breath    Thomas Robinson is a 59 y.o. male.  Patient is a 60 year old male with history of end-stage renal disease on hemodialysis, hypertension, hyperlipidemia, anemia.  Patient brought by EMS for evaluation of shortness of breath.  Patient reports having not had dialysis in nearly 1 week.  He tells me he has no dialysis center that he goes to regularly and receives his dialysis when he comes to the hospital.  He denies to me he has having any fevers, chills, or cough.  He describes shortness of breath that is worse when he lies flat.  He denies any leg swelling.  The history is provided by the patient.       Past Medical History:  Diagnosis Date  . Acute on chronic diastolic CHF (congestive heart failure) ([place]) 04/13/2018  . Acute pulmonary edema ([place]) 01/23/2020  . Acute respiratory failure with hypoxia ([place]) 06/04/2019  . Anaphylactic shock, unspecified, sequela 06/10/2019  . Arthritis   . Asthma, chronic, unspecified asthma severity, with acute exacerbation 10/23/2017  . Atypical chest pain 02/16/2018  . CAP (community acquired pneumonia) 10/23/2017  . Carpal tunnel syndrome of right wrist 10/05/2018  . Cataract    right - removed by surgery  . Cerebral infarction due to thrombosis of cerebral artery ([place])   . Cervical disc herniation 01/04/2020  . Chest pain 04/13/2018  . Chronic diastolic heart failure ([place]) 04/13/2018  . Chronic low back pain 11/24/2019  . Constipation   . Cough    chronic cough  . Demand ischemia ([place]) 06/18/2020  . Dyspnea    "all the time" per patient at PAT 12/30/19  RA sats 87%, no oxygen use  . Encounter for immunization 07/08/2017  . ESRD on hemodialysis ([place]) 02/16/2018   Dialysis  T Thus Sat  - [facility]   . Fall 06/04/2019  . Fall at home, initial  encounter 06/04/2019  . GERD (gastroesophageal reflux disease) 06/04/2019  . GI bleeding 05/24/2017  . Gram-negative sepsis, unspecified ([place]) 09/05/2017  . History of completed stroke 2018  . History of fusion of cervical spine 03/26/2020  . Hyperlipidemia   . Hypertension   . Hypertensive urgency 05/24/2017  . Hypokalemia 06/04/2017  . Iron deficiency anemia, unspecified 09/09/2017  . Left shoulder pain 11/11/2019  . Lumbar radiculopathy 11/24/2019  . Lung contusion 06/04/2019  . LVH (left ventricular hypertrophy) due to hypertensive disease, with heart failure ([place]) 06/18/2020  . Macrocytic anemia 05/24/2017  . Moderate protein-calorie malnutrition ([place]) 06/06/2017  . Myofascial pain syndrome 06/12/2020  . Neuritis of right ulnar nerve 09/13/2018  . Non-compliance with renal dialysis ([place]) 02/08/2020  . Other hyperlipidemia 06/04/2017  . Oxygen deficiency 12/30/2019   O2 sats on RA 87% at PAT appt   . Pain in joint of right elbow 09/13/2018  . Renovascular hypertension 06/22/2020  . Respiratory failure, acute ([place]) 10/23/2017  . Restless leg syndrome   . Rib fractures 06/2019   Right  . Secondary hyperparathyroidism of renal origin ([place]) 06/04/2017  . Symptomatic anemia 05/24/2017  . Thoracic ascending aortic aneurysm ([facility])    4.5 cm 06/04/19 CT  . Ulnar neuropathy 10/05/2018  . Volume overload 10/23/2017    Patient Active Problem List   Diagnosis Date Noted  . Hyperkalemia 08/05/2020  . Arthritis   .  Asthma   . Cataract   . Cerebral infarction due to thrombosis of cerebral artery ([place])   . History of CVA (cerebrovascular accident)   . Chronic kidney disease   . Dyspnea   . Hyperlipidemia   . Hypertension   . Kidney failure   . Restless leg syndrome   . Degenerative lumbar spinal stenosis 06/26/2020  . Diastolic dysfunction 26/94/8546  . Renovascular hypertension 06/22/2020  . Demand ischemia ([place]) 06/18/2020  . LVH (left ventricular hypertrophy) due to hypertensive disease, with heart  failure ([place]) 06/18/2020  . Myofascial pain syndrome 06/12/2020  . Encounter for removal of sutures 03/29/2020  . History of fusion of cervical spine 03/26/2020  . Non-compliance with renal dialysis ([place]) 02/08/2020  . Acute pulmonary edema ([place]) 01/23/2020  . Cervical disc herniation 01/04/2020  . Tachycardia 01/04/2020  . SOB (shortness of breath)   . Oxygen deficiency 12/30/2019  . Chronic low back pain 11/24/2019  . Degeneration of lumbar intervertebral disc 11/24/2019  . Lumbar radiculopathy 11/24/2019  . Left shoulder pain 11/11/2019  . Anaphylactic shock, unspecified, sequela 06/10/2019  . Rib fractures 06/05/2019  . Fall at home, initial encounter 06/04/2019  . Right rib fracture 06/04/2019  . Lung contusion 06/04/2019  . Acute respiratory failure with hypoxia ([place]) 06/04/2019  . Anemia in ESRD (end-stage renal disease) ([place]) 06/04/2019  . GERD (gastroesophageal reflux disease) 06/04/2019  . Chronic diastolic (congestive) heart failure ([place]) 06/04/2019  . Fall 06/04/2019  . Thoracic ascending aortic aneurysm ([place]) 06/04/2019  . Acute exacerbation of congestive heart failure ([place]) 05/16/2019  . Carpal tunnel syndrome of right wrist 10/05/2018  . Ulnar neuropathy 10/05/2018  . Neuritis of right ulnar nerve 09/13/2018  . Pain in joint of right elbow 09/13/2018  . Chest pain 04/13/2018  . Chronic diastolic heart failure ([place]) 04/13/2018  . Acute on chronic diastolic CHF (congestive heart failure) ([place]) 04/13/2018  . Chest discomfort   . Atypical chest pain 02/16/2018  . ESRD on dialysis ([place]) 02/16/2018  . ESRD (end stage renal disease) on dialysis ([place]) 02/16/2018  . Volume overload 10/23/2017  . CAP (community acquired pneumonia) 10/23/2017  . Asthma, chronic, unspecified asthma severity, with acute exacerbation 10/23/2017  . Respiratory failure, acute ([place]) 10/23/2017  . Iron deficiency anemia, unspecified 09/09/2017  . Gram-negative sepsis, unspecified ([place])  09/05/2017  . Cough 08/23/2017  . Encounter for immunization 07/08/2017  . Moderate protein-calorie malnutrition ([place]) 06/06/2017  . Coagulation defect, unspecified ([place]) 06/04/2017  . Hypokalemia 06/04/2017  . Other hyperlipidemia 06/04/2017  . Secondary hyperparathyroidism of renal origin ([place]) 06/04/2017  . Constipation   . Macrocytic anemia 05/24/2017  . Uremia 05/24/2017  . Hypertensive urgency 05/24/2017  . Acute kidney injury superimposed on CKD ([place]) 05/24/2017  . CKD (chronic kidney disease), stage V ([place]) 05/24/2017  . GI bleeding 05/24/2017  . Symptomatic anemia 05/24/2017  . Stroke ([place]) 2018  . AKI (acute kidney injury) ([place]) 04/10/2015  . History of completed stroke   . Essential hypertension 04/09/2015    Past Surgical History:  Procedure Laterality Date  . ANTERIOR CERVICAL DECOMP/DISCECTOMY FUSION N/A 01/04/2020   Procedure: ANTERIOR CERVICAL DECOMPRESSION/DISCECTOMY FUSION CERVICAL FIVE THROUGH SEVEN;  Surgeon: Melina Schools, MD;  Location: [facility];  Service: Orthopedics;  Laterality: N/A;  3 hrs  . AV FISTULA PLACEMENT Left 05/28/2017   Procedure: LEFT ARM ARTERIOVENOUS (AV) FISTULA CREATION;  Surgeon: Conrad Woodford, MD;  Location: [facility];  Service: Vascular;  Laterality: Left;  . EYE SURGERY Right 06/02/2019  Cataract removed  . FRACTURE SURGERY    . IR FLUORO GUIDE CV LINE RIGHT  05/25/2017  . IR US GUIDE VASC ACCESS RIGHT  05/25/2017  . REVISON OF ARTERIOVENOUS FISTULA Left 07/18/2019   Procedure: REVISION PLICATION OF RADIOCEPHALIC ARTERIOVENOUS FISTULA LEFT ARM;  Surgeon: Angelia Mould, MD;  Location: [facility];  Service: Vascular;  Laterality: Left;  . RINOPLASTY         Family History  Problem Relation Age of Onset  . Cancer Mother     Social History   Tobacco Use  . Smoking status: Never Smoker  . Smokeless tobacco: Never Used  Vaping Use  . Vaping Use: Never used  Substance Use Topics  . Alcohol use: Not Currently    Alcohol/week: 0.0  standard drinks    Comment: has a drink once in a while- 12/30/19  . Drug use: No    Home Medications Prior to Admission medications   Medication Sig Start Date End Date Taking? Authorizing Provider  albuterol (VENTOLIN HFA) 108 (90 Base) MCG/ACT inhaler Inhale 2 puffs into the lungs every 6 (six) hours as needed for wheezing or shortness of breath.     [provider]  amLODipine (NORVASC) 10 MG tablet Take 10 mg by mouth daily.     [provider]  atorvastatin (LIPITOR) 20 MG tablet Take 20 mg by mouth daily. 04/19/20   [provider]  AURYXIA 1 GM 210 MG(Fe) tablet Take 420 mg by mouth 3 (three) times daily with meals.  03/16/20   [provider]  BREO ELLIPTA 100-25 MCG/INH AEPB Inhale 1 puff into the lungs daily. 06/26/20   [provider]  cholecalciferol (VITAMIN D3) 25 MCG (1000 UNIT) tablet Take 1,000 Units by mouth daily.    [provider]  furosemide (LASIX) 40 MG tablet Take 40 mg of Lasix on non-dialysis days. Patient taking differently: Take 40 mg by mouth See admin instructions. On Sunday, Monday, Wednesday, Thursday and Saturday 03/21/20   Tobb, Kardie, DO  gabapentin (NEURONTIN) 300 MG capsule Take 300 mg by mouth 2 (two) times daily as needed (pain).     [provider]  hydrALAZINE (APRESOLINE) 25 MG tablet Take 50 mg by mouth 3 (three) times daily.    [provider]  metoprolol tartrate (LOPRESSOR) 100 MG tablet Take 100 mg by mouth 2 (two) times daily.     [provider]  multivitamin (RENA-VIT) TABS tablet Take 1 tablet by mouth daily. 01/12/18   [provider]  sevelamer carbonate (RENVELA) 800 MG tablet Take 800 mg by mouth daily. 11/07/19   [provider]  Specialty Vitamins Products (HEALTHY HEART COMPLEX PO) Take 1 tablet by mouth daily.    [provider]    Allergies    Patient has no known allergies.  Review of Systems   Review of Systems  All other  systems reviewed and are negative.   Physical Exam Updated Vital Signs BP (!) 185/91   Pulse 93   Temp 98.6 F (37 C) (Oral)   Resp 20   Ht 5\' 8"  (1.727 m)   Wt 59 kg   SpO2 96%   BMI 19.77 kg/m   Physical Exam Vitals and nursing note reviewed.  Constitutional:      General: He is not in acute distress.    Appearance: He is well-developed and well-nourished. He is not diaphoretic.  HENT:     Head: Normocephalic and atraumatic.     Mouth/Throat:  Mouth: Oropharynx is clear and moist.  Cardiovascular:     Rate and Rhythm: Normal rate and regular rhythm.     Heart sounds: No murmur heard. No friction rub.  Pulmonary:     Effort: Pulmonary effort is normal. No respiratory distress.     Breath sounds: Examination of the right-lower field reveals rales. Examination of the left-lower field reveals rales. Rales present. No wheezing.  Abdominal:     General: Bowel sounds are normal. There is no distension.     Palpations: Abdomen is soft.     Tenderness: There is no abdominal tenderness.  Musculoskeletal:        General: No edema. Normal range of motion.     Cervical back: Normal range of motion and neck supple.     Right lower leg: No tenderness. No edema.     Left lower leg: No tenderness. No edema.  Skin:    General: Skin is warm and dry.  Neurological:     Mental Status: He is alert and oriented to person, place, and time.     Coordination: Coordination normal.     ED Results / Procedures / Treatments   Labs (all labs ordered are listed, but only abnormal results are displayed) Labs Reviewed  RESP PANEL BY RT-PCR (FLU A&B, COVID) [id number]  BASIC METABOLIC PANEL  CBC WITH DIFFERENTIAL/PLATELET    EKG EKG Interpretation  Date/Time:  Monday August 13 2020 02:04:50 EST Ventricular Rate:  93 PR Interval:    QRS Duration: 87 QT Interval:  381 QTC Calculation: 474 R Axis:   74 Text Interpretation: Sinus rhythm Biatrial enlargement LVH with secondary  repolarization abnormality No significant change since 08/05/2020 Confirmed by Veryl Speak [id number] on 08/13/2020 2:08:15 AM   Radiology No results found.  Procedures Procedures (including critical care time)  Medications Ordered in ED Medications - No data to display  ED Course  I have reviewed the triage vital signs and the nursing notes.  Pertinent labs & imaging results that were available during my care of the patient were reviewed by me and considered in my medical decision making (see chart for details).    MDM Rules/Calculators/A&P  Patient presenting here with complaints of shortness of breath.  He has history of end-stage renal disease and is on hemodialysis.  Patient last dialyzed 1 week ago.  He apparently has no home dialysis center and comes to the ER to have this performed.  Patient does have evidence for volume overload including elevated blood pressure and chest x-ray suggestive of this.  He is hypoxic with oxygen saturations in the upper 80s while on room air.  Bicarb is 18 and potassium is 5.3 with likely peaked T waves.  This was discussed with Dr. Johnney Ou from nephrology.  Arrangements will be made for the patient to be dialyzed with anticipated discharge afterward.  CRITICAL CARE Performed by: Veryl Speak Total critical care time: 35 minutes Critical care time was exclusive of separately billable procedures and treating other patients. Critical care was necessary to treat or prevent imminent or life-threatening deterioration. Critical care was time spent personally by me on the following activities: development of treatment plan with patient and/or surrogate as well as nursing, discussions with consultants, evaluation of patient's response to treatment, examination of patient, obtaining history from patient or surrogate, ordering and performing treatments and interventions, ordering and review of laboratory studies, ordering and review of radiographic studies,  pulse oximetry and re-evaluation of patient's condition.   Final Clinical Impression(s) /  ED Diagnoses Final diagnoses:  None    Rx / DC Orders ED Discharge Orders    None       Veryl Speak, MD 08/13/20 [id number]

## 2020-08-13 NOTE — Procedures (Signed)
Asked by ED to see this patient for dialysis. Pt is w/o an outside HD unit due to behavioral issues.  Pt's main c/o is SOB.  Leg swelling is moderate.  No seizures or confusion. On exam 1-2+ leg edema, scattered crackles bilat bases, nasal O2, mild ^WOB.  Plan HD upstairs 4 L UF as tolerated.  Will d/c back to ED for re-assessment when HD completed.   I was present at this dialysis session, have reviewed the session itself and made  appropriate changes [name] MD [place] pager [phone]   08/13/2020, 4:39 PM

## 2020-08-13 NOTE — ED Notes (Signed)
Patient keeps pulling BP cuff off stating its getting to tight. Attempting x3 to get a BP and he continuously pulled the cuff off

## 2020-08-13 NOTE — ED Notes (Signed)
Taken to dialysis

## 2020-08-13 NOTE — ED Notes (Signed)
Patient gone once provider arrived to talk to patient.

## 2020-08-14 ENCOUNTER — Other Ambulatory Visit: Payer: Self-pay | Admitting: Cardiology

## 2020-08-20 ENCOUNTER — Ambulatory Visit: Payer: Medicare Other | Admitting: Cardiology

## 2020-08-21 ENCOUNTER — Encounter: Payer: Self-pay | Admitting: Cardiology

## 2020-08-21 ENCOUNTER — Other Ambulatory Visit: Payer: Self-pay

## 2020-08-21 ENCOUNTER — Ambulatory Visit (INDEPENDENT_AMBULATORY_CARE_PROVIDER_SITE_OTHER): Payer: Medicare Other | Admitting: Cardiology

## 2020-08-21 VITALS — BP 130/78 | HR 75 | Ht 68.0 in | Wt 131.0 lb

## 2020-08-21 DIAGNOSIS — I712 Thoracic aortic aneurysm, without rupture: Secondary | ICD-10-CM | POA: Diagnosis not present

## 2020-08-21 DIAGNOSIS — I5032 Chronic diastolic (congestive) heart failure: Secondary | ICD-10-CM

## 2020-08-21 DIAGNOSIS — I10 Essential (primary) hypertension: Secondary | ICD-10-CM

## 2020-08-21 DIAGNOSIS — N186 End stage renal disease: Secondary | ICD-10-CM | POA: Diagnosis not present

## 2020-08-21 DIAGNOSIS — I5189 Other ill-defined heart diseases: Secondary | ICD-10-CM

## 2020-08-21 DIAGNOSIS — Z992 Dependence on renal dialysis: Secondary | ICD-10-CM

## 2020-08-21 DIAGNOSIS — Z9115 Patient's noncompliance with renal dialysis: Secondary | ICD-10-CM

## 2020-08-21 DIAGNOSIS — I7121 Aneurysm of the ascending aorta, without rupture: Secondary | ICD-10-CM

## 2020-08-21 NOTE — Patient Instructions (Signed)

## 2020-08-22 NOTE — Progress Notes (Signed)
Cardiology Office Note:    Date:  08/22/2020   ID:  [name], DOB 20-Sep-1960, MRN [id number]  PCP:  [name], PA  Cardiologist:  [name], DO  Electrophysiologist:  None   Referring MD: [name], PA   " The ER is terrible I keep waiting for hours before getting dialysis"  History of Present Illness:    Thomas Robinson is a 59 y.o. male with a hx of hx of ESRD on hemodialysis(Tuesdays, Thursday, Saturday)hypertension, asthma, CVA,coronary artery disease,diastolic heart failure with recent ejection fraction reported as greater than 65%, anemia chronic disease, proximal ascending aorta dilatation measuring 4.5 cm in October 2020, recent CTA show that his aneurysm is still 4.5 cm.  I last saw the patient in July 2021 during that time because he makes urine I put the patient on Lasix on nondialysis day.  I emphasized with the patient again the significance of setting up an appointment for nephrology as I had referred him given his kidney disease.  He was not compliant with his hemodialysis I advised the patient that this could be detrimental.  I saw the patient on June 27, 2020 at that time he was volume overloaded as he had missed dialysis and asked him that he needed to go to the emergency department to be evaluated and needed hemodialysis.  Patient is here today for follow-up visit.  Tells me that he was told to in the emergency department to follow-up with my office but he is not experiencing any chest, shortness of breath.  During his visit today he is makes a statement (that his lawyer is going to be addressing his issue with the surgeons, and the dialysis center, but when asked him what is going on he would not explain the meaning any details).  He still has not follow-up with nephrology or establish care and still does not have a dialysis center.   Past Medical History:  Diagnosis Date   Acute on chronic diastolic CHF (congestive heart failure)  ([place]) 04/13/2018   Acute pulmonary edema ([place]) 01/23/2020   Acute respiratory failure with hypoxia (HCC) 06/04/2019   Anaphylactic shock, unspecified, sequela 06/10/2019   Arthritis    Asthma, chronic, unspecified asthma severity, with acute exacerbation 10/23/2017   Atypical chest pain 02/16/2018   CAP (community acquired pneumonia) 10/23/2017   Carpal tunnel syndrome of right wrist 10/05/2018   Cataract    right - removed by surgery   Cerebral infarction due to thrombosis of cerebral artery (HCC)    Cervical disc herniation 01/04/2020   Chest pain 04/13/2018   Chronic diastolic heart failure ([place]) 04/13/2018   Chronic low back pain 11/24/2019   Constipation    Cough    chronic cough   Demand ischemia ([place]) 06/18/2020   Dyspnea    "all the time" per patient at PAT 12/30/19  RA sats 87%, no oxygen use   Encounter for immunization 07/08/2017   ESRD on hemodialysis ([place]) 02/16/2018   Dialysis  T Thus Sat  - [facility]    Fall 06/04/2019   Fall at home, initial encounter 06/04/2019   GERD (gastroesophageal reflux disease) 06/04/2019   GI bleeding 05/24/2017   Gram-negative sepsis, unspecified ([place]) 09/05/2017   History of completed stroke 2018   History of fusion of cervical spine 03/26/2020   Hyperlipidemia    Hypertension    Hypertensive urgency 05/24/2017   Hypokalemia 06/04/2017   Iron deficiency anemia, unspecified 09/09/2017   Left shoulder pain 11/11/2019  Lumbar radiculopathy 11/24/2019   Lung contusion 06/04/2019   LVH (left ventricular hypertrophy) due to hypertensive disease, with heart failure ([place]) 06/18/2020   Macrocytic anemia 05/24/2017   Moderate protein-calorie malnutrition (HCC) 06/06/2017   Myofascial pain syndrome 06/12/2020   Neuritis of right ulnar nerve 09/13/2018   Non-compliance with renal dialysis ([place]) 02/08/2020   Other hyperlipidemia 06/04/2017   Oxygen deficiency 12/30/2019   O2 sats on RA 87% at PAT appt    Pain in  joint of right elbow 09/13/2018   Renovascular hypertension 06/22/2020   Respiratory failure, acute ([place]) 10/23/2017   Restless leg syndrome    Rib fractures 06/2019   Right   Secondary hyperparathyroidism of renal origin ([place]) 06/04/2017   Symptomatic anemia 05/24/2017   Thoracic ascending aortic aneurysm (HCC)    4.5 cm 06/04/19 CT   Ulnar neuropathy 10/05/2018   Volume overload 10/23/2017    Past Surgical History:  Procedure Laterality Date   ANTERIOR CERVICAL DECOMP/DISCECTOMY FUSION N/A 01/04/2020   Procedure: ANTERIOR CERVICAL DECOMPRESSION/DISCECTOMY FUSION CERVICAL FIVE THROUGH SEVEN;  Surgeon: Melina Schools, MD;  Location: [facility];  Service: Orthopedics;  Laterality: N/A;  3 hrs   AV FISTULA PLACEMENT Left 05/28/2017   Procedure: LEFT ARM ARTERIOVENOUS (AV) FISTULA CREATION;  Surgeon: Conrad Winchester, MD;  Location: [facility] OR;  Service: Vascular;  Laterality: Left;   EYE SURGERY Right 06/02/2019   Cataract removed   FRACTURE SURGERY     IR FLUORO GUIDE CV LINE RIGHT  05/25/2017   IR US GUIDE VASC ACCESS RIGHT  05/25/2017   REVISON OF ARTERIOVENOUS FISTULA Left 07/18/2019   Procedure: REVISION PLICATION OF RADIOCEPHALIC ARTERIOVENOUS FISTULA LEFT ARM;  Surgeon: Angelia Mould, MD;  Location: [facility] OR;  Service: Vascular;  Laterality: Left;   RINOPLASTY      Current Medications: Current Meds  Medication Sig   albuterol (VENTOLIN HFA) 108 (90 Base) MCG/ACT inhaler Inhale 2 puffs into the lungs every 6 (six) hours as needed for wheezing or shortness of breath.    amLODipine (NORVASC) 10 MG tablet Take 10 mg by mouth daily.    atorvastatin (LIPITOR) 20 MG tablet Take 20 mg by mouth daily.   AURYXIA 1 GM 210 MG(Fe) tablet Take 420 mg by mouth 3 (three) times daily with meals.    BREO ELLIPTA 100-25 MCG/INH AEPB Inhale 1 puff into the lungs daily.   cholecalciferol (VITAMIN D3) 25 MCG (1000 UNIT) tablet Take 1,000 Units by mouth daily.   furosemide (LASIX) 40 MG  tablet Take 1 tablet (40 mg total) by mouth See admin instructions. On Sunday, Monday, Wednesday, Thursday and Saturday   gabapentin (NEURONTIN) 300 MG capsule Take 300 mg by mouth 2 (two) times daily as needed (pain).    hydrALAZINE (APRESOLINE) 25 MG tablet Take 50 mg by mouth 3 (three) times daily.   metoprolol tartrate (LOPRESSOR) 100 MG tablet Take 100 mg by mouth 2 (two) times daily.    multivitamin (RENA-VIT) TABS tablet Take 1 tablet by mouth daily.   sevelamer carbonate (RENVELA) 800 MG tablet Take 800 mg by mouth daily.   Specialty Vitamins Products (HEALTHY HEART COMPLEX PO) Take 1 tablet by mouth daily.     Allergies:   Patient has no known allergies.   Social History   Socioeconomic History   Marital status: Single    Spouse name: Not on file   Number of children: Not on file   Years of education: Not on file   Highest education level: Not on  file  Occupational History   Not on file  Tobacco Use   Smoking status: Never Smoker   Smokeless tobacco: Never Used  Vaping Use   Vaping Use: Never used  Substance and Sexual Activity   Alcohol use: Not Currently    Alcohol/week: 0.0 standard drinks    Comment: has a drink once in a while- 12/30/19   Drug use: No   Sexual activity: Not Currently  Other Topics Concern   Not on file  Social History Narrative   Lives with a roommate in a one story home.  Has 1 child.  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  Education: high school.   Social Determinants of Health   Financial Resource Strain: Not on file  Food Insecurity: Not on file  Transportation Needs: Not on file  Physical Activity: Not on file  Stress: Not on file  Social Connections: Not on file     Family History: The patient's family history includes Cancer in his mother.  ROS:   Review of Systems  Constitution: Negative for decreased appetite, fever and weight gain.  HENT: Negative for congestion, ear discharge, hoarse voice and sore throat.    Eyes: Negative for discharge, redness, vision loss in right eye and visual halos.  Cardiovascular: Negative for chest pain, dyspnea on exertion, leg swelling, orthopnea and palpitations.  Respiratory: Negative for cough, hemoptysis, shortness of breath and snoring.   Endocrine: Negative for heat intolerance and polyphagia.  Hematologic/Lymphatic: Negative for bleeding problem. Does not bruise/bleed easily.  Skin: Negative for flushing, nail changes, rash and suspicious lesions.  Musculoskeletal: Negative for arthritis, joint pain, muscle cramps, myalgias, neck pain and stiffness.  Gastrointestinal: Negative for abdominal pain, bowel incontinence, diarrhea and excessive appetite.  Genitourinary: Negative for decreased libido, genital sores and incomplete emptying.  Neurological: Negative for brief paralysis, focal weakness, headaches and loss of balance.  Psychiatric/Behavioral: Negative for altered mental status, depression and suicidal ideas.  Allergic/Immunologic: Negative for HIV exposure and persistent infections.    EKGs/Labs/Other Studies Reviewed:    The following studies were reviewed today:   EKG: None today.  Coronary CTA IMPRESSION: 1. Fusiform aneurysmal dilation of the tubular portion of the ascending thoracic aorta with a maximal transverse diameter of 4.5 cm. Previous measurements from 02/08/2020 were limited by significant cardiac motion artifact. Recommend semi-annual imaging followup by CTA or MRA and referral to cardiothoracic surgery if not already obtained. This recommendation follows 2010 ACCF/AHA/AATS/ACR/ASA/SCA/SCAI/SIR/STS/SVM Guidelines for the Diagnosis and Management of Patients With Thoracic Aortic Disease. Circulation. 2010; 121: C585-I778. Aortic aneurysm NOS (ICD10-I71.9); Aortic Atherosclerosis (ICD10-170.0) 2. Coronary artery calcifications. 3. Mosaic attenuation of the lungs with areas of mild ground-glass attenuation airspace opacity and areas of  probable air trapping. This suggests underlying small airways or small vessels disease. 4. Diffuse mild bronchial wall thickening suggests chronic bronchitis. 5. Cardiomegaly with trace interstitial pulmonary edema  Electronically Signed   By: Jacqulynn Cadet M.D.  Recent Labs: 02/08/2020: B Natriuretic Peptide 4,270.9 07/10/2020: ALT 11 08/13/2020: BUN 116; Creatinine, Ser 11.39; Hemoglobin 8.2; Platelets 129; Potassium 5.3; Sodium 138  Recent Lipid Panel    Component Value Date/Time   CHOL 133 06/10/2019 0944   TRIG 59 06/10/2019 0944   HDL 46 06/10/2019 0944   CHOLHDL 2.9 06/10/2019 0944   CHOLHDL 2.8 02/17/2018 0345   VLDL 14 02/17/2018 0345   LDLCALC 74 06/10/2019 0944    Physical Exam:    VS:  BP 130/78    Pulse 75  Ht 5\' 8"  (1.727 m)    Wt 131 lb (59.4 kg)    SpO2 90%    BMI 19.92 kg/m     Wt Readings from Last 3 Encounters:  08/21/20 131 lb (59.4 kg)  08/13/20 130 lb (59 kg)  08/05/20 136 lb 11 oz (62 kg)     GEN: Well nourished, well developed in no acute distress HEENT: Normal NECK: No JVD; No carotid bruits LYMPHATICS: No lymphadenopathy CARDIAC: S1S2 noted,RRR, no murmurs, rubs, gallops RESPIRATORY:  Clear to auscultation without rales, wheezing or rhonchi  ABDOMEN: Soft, non-tender, non-distended, +bowel sounds, no guarding. EXTREMITIES: No edema, No cyanosis, no clubbing MUSCULOSKELETAL:  No deformity  SKIN: Warm and dry NEUROLOGIC:  Alert and oriented x 3, non-focal PSYCHIATRIC:  Normal affect, good insight  ASSESSMENT:    1. Chronic diastolic heart failure ([place])   2. Thoracic ascending aortic aneurysm ([place])   3. Essential hypertension   4. ESRD (end stage renal disease) on dialysis ([place])   5. Diastolic dysfunction   6. Non-compliance with renal dialysis ([place])    PLAN:      He does not appear to be volume overloaded today.  He tells me that he has been going to the emergency department to get his dialysis.  He plans on getting some dialysis  equipment from a family member who worked at hospital before which he notes that this family member will be doing his in-house dialysis.  I did explain to the patient this is a bad idea and he needs to reestablish care with nephrology for hemodialysis-but he tells me this is his business that he knows what he is doing.  His blood pressure is acceptable in the office today.  No changes will be made.  He has had a repeat CT of the chest which show stable 4.5 cm thoracic ascending aneurysm.   The patient is in agreement with the above plan. The patient left the office in stable condition.  The patient will follow up in 6 months or sooner if needed.   Medication Adjustments/Labs and Tests Ordered: Current medicines are reviewed at length with the patient today.  Concerns regarding medicines are outlined above.  No orders of the defined types were placed in this encounter.  No orders of the defined types were placed in this encounter.   Patient Instructions  Medication Instructions:  Your physician recommends that you continue on your current medications as directed. Please refer to the Current Medication list given to you today.  *If you need a refill on your cardiac medications before your next appointment, please call your pharmacy*   Lab Work: NONE If you have labs (blood work) drawn today and your tests are completely normal, you will receive your results only by:  [place] (if you have MyChart) OR  A paper copy in the mail If you have any lab test that is abnormal or we need to change your treatment, we will call you to review the results.   Testing/Procedures: NONE   Follow-Up: At [facility], you and your health needs are our priority.  As part of our continuing mission to provide you with exceptional heart care, we have created designated Provider Care Teams.  These Care Teams include your primary Cardiologist (physician) and Advanced Practice Providers (APPs -   Physician Assistants and Nurse Practitioners) who all work together to provide you with the care you need, when you need it.  We recommend signing up for the patient portal called "MyChart".  Sign up information is provided on this After Visit Summary.  MyChart is used to connect with patients for Virtual Visits (Telemedicine).  Patients are able to view lab/test results, encounter notes, upcoming appointments, etc.  Non-urgent messages can be sent to your provider as well.   To learn more about what you can do with MyChart, go to [email or web address].    Your next appointment:   6 month(s)  The format for your next appointment:   In Person  Provider:   [name], DO   Other Instructions      Adopting a Healthy Lifestyle.  Know what a healthy weight is for you (roughly BMI <25) and aim to maintain this   Aim for 7+ servings of fruits and vegetables daily   65-80+ fluid ounces of water or unsweet tea for healthy kidneys   Limit to max 1 drink of alcohol per day; avoid smoking/tobacco   Limit animal fats in diet for cholesterol and heart health - choose grass fed whenever available   Avoid highly processed foods, and foods high in saturated/trans fats   Aim for low stress - take time to unwind and care for your mental health   Aim for 150 min of moderate intensity exercise weekly for heart health, and weights twice weekly for bone health   Aim for 7-9 hours of sleep daily   When it comes to diets, agreement about the perfect plan isnt easy to find, even among the experts. Experts at the [employer] developed an idea known as the Healthy Eating Plate. Just imagine a plate divided into logical, healthy portions.   The emphasis is on diet quality:   Load up on vegetables and fruits - one-half of your plate: Aim for color and variety, and remember that potatoes dont count.   Go for whole grains - one-quarter of your plate: Whole wheat, barley, wheat  berries, quinoa, oats, brown rice, and foods made with them. If you want pasta, go with whole wheat pasta.   Protein power - one-quarter of your plate: Fish, chicken, beans, and nuts are all healthy, versatile protein sources. Limit red meat.   The diet, however, does go beyond the plate, offering a few other suggestions.   Use healthy plant oils, such as olive, canola, soy, corn, sunflower and peanut. Check the labels, and avoid partially hydrogenated oil, which have unhealthy trans fats.   If youre thirsty, drink water. Coffee and tea are good in moderation, but skip sugary drinks and limit milk and dairy products to one or two daily servings.   The type of carbohydrate in the diet is more important than the amount. Some sources of carbohydrates, such as vegetables, fruits, whole grains, and beans-are healthier than others.   Finally, stay active  Signed, [name], DO  08/22/2020 9:24 AM    [place]

## 2020-08-25 DIAGNOSIS — R778 Other specified abnormalities of plasma proteins: Secondary | ICD-10-CM | POA: Insufficient documentation

## 2020-08-26 DIAGNOSIS — G9340 Encephalopathy, unspecified: Secondary | ICD-10-CM | POA: Insufficient documentation

## 2020-08-26 DIAGNOSIS — G629 Polyneuropathy, unspecified: Secondary | ICD-10-CM | POA: Insufficient documentation

## 2020-08-27 DIAGNOSIS — Z9889 Other specified postprocedural states: Secondary | ICD-10-CM

## 2020-08-27 HISTORY — DX: Other specified postprocedural states: Z98.890

## 2020-09-03 IMAGING — CR DG CHEST 2V
2 series · 2 of 2 positions shown · non-contrast
Comparison: 06/05/2019

CLINICAL DATA: Preoperative evaluation

EXAM:
CHEST - 2 VIEW

[w chest pa]
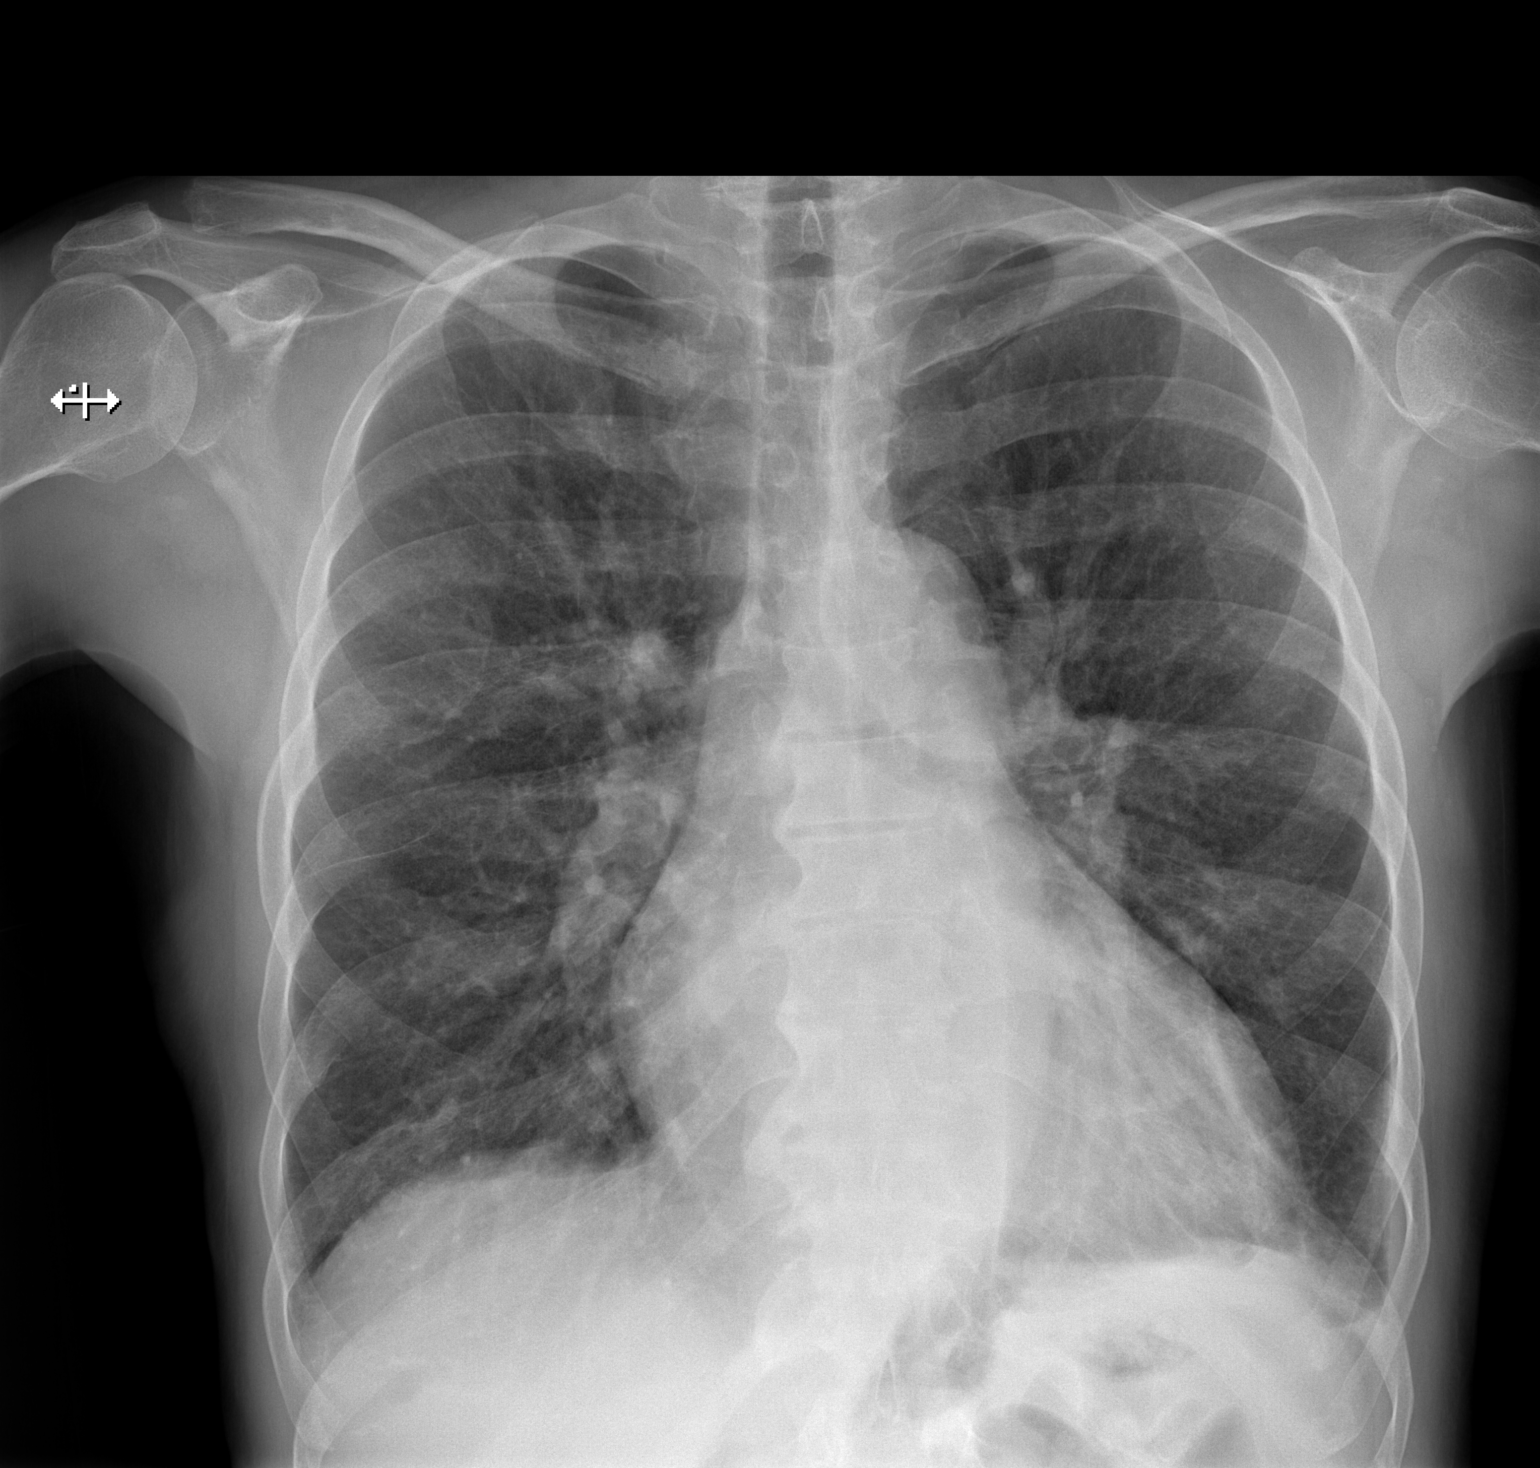

[w chest lat]
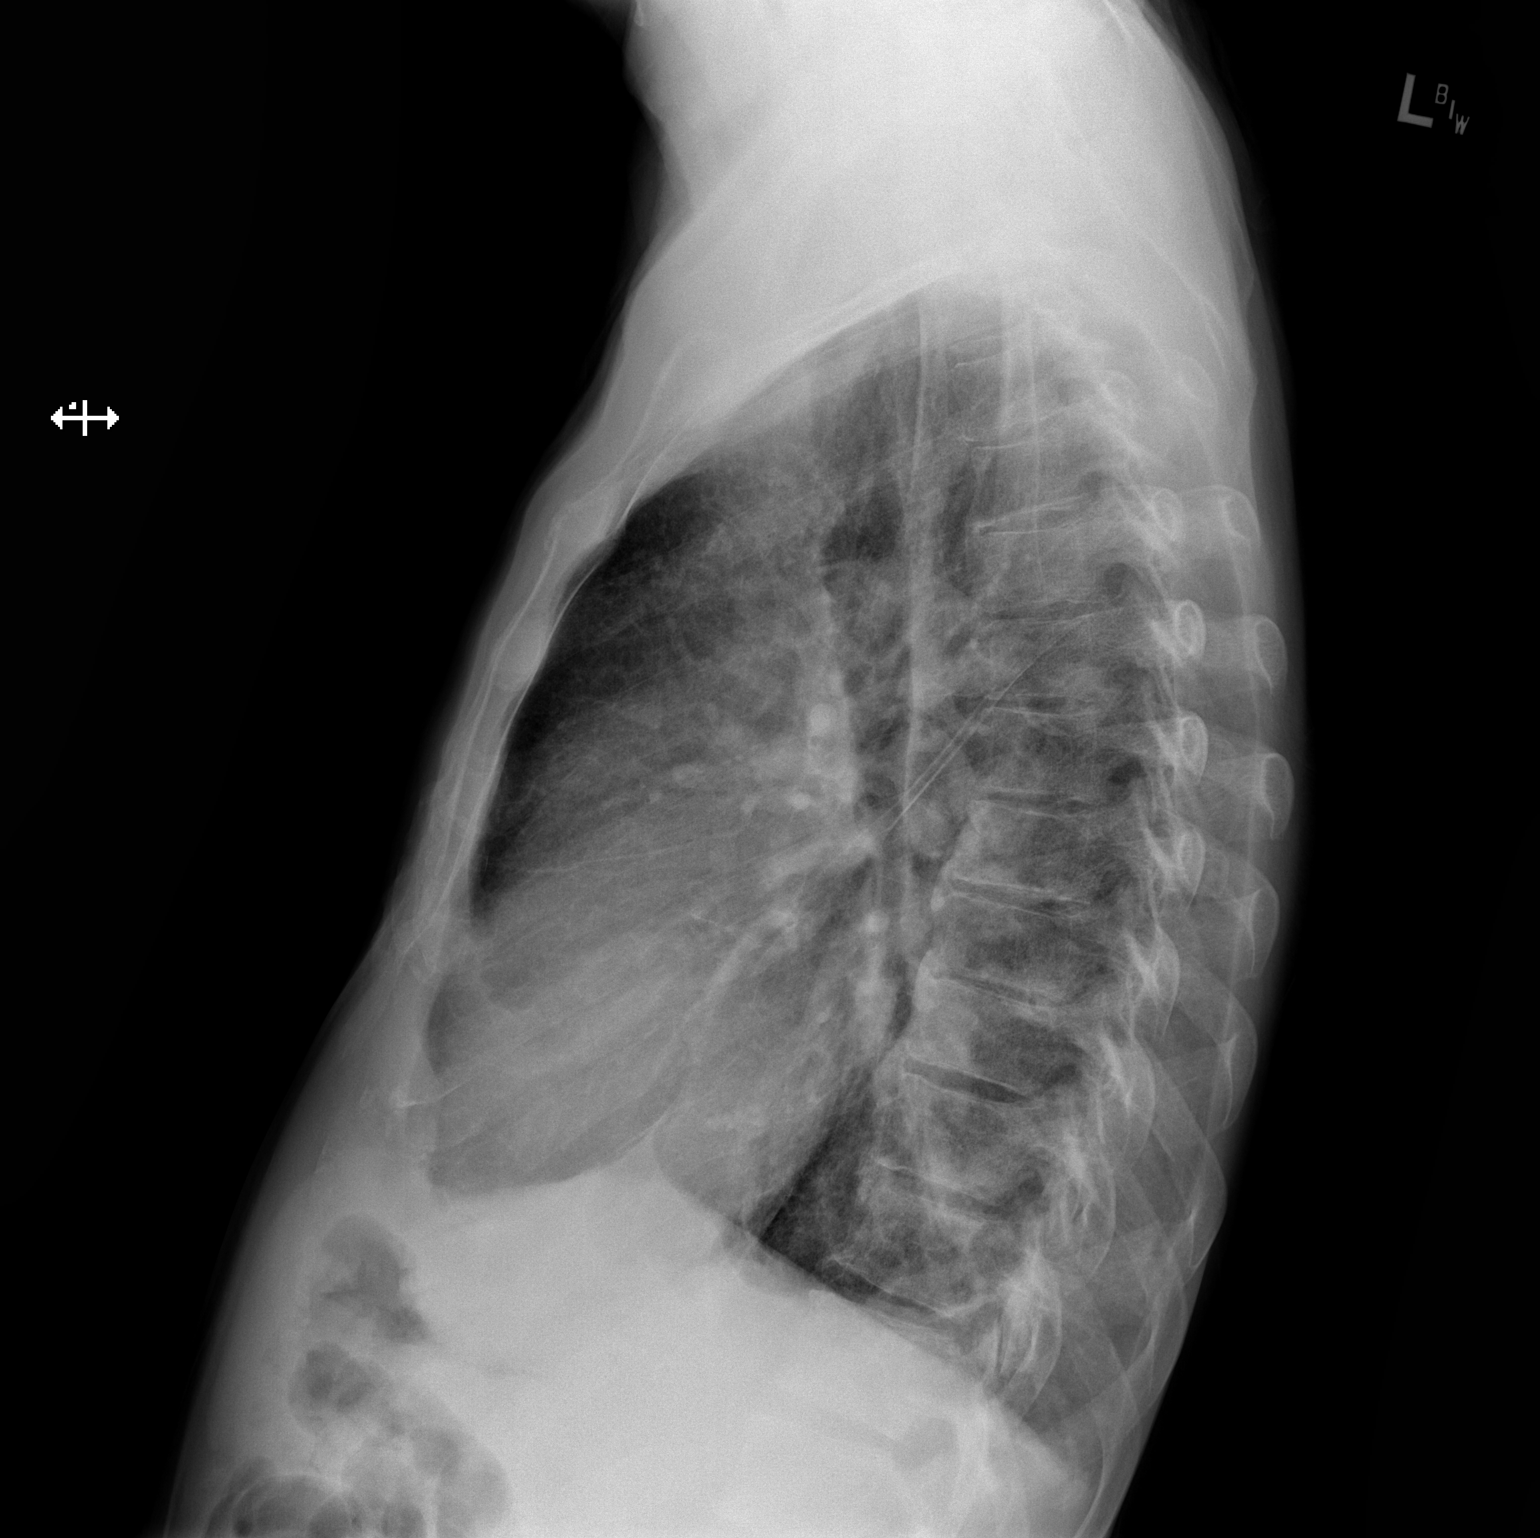

[2 of 2 positions shown; findings below may reference images not displayed]

FINDINGS: Minimal enlargement of cardiac silhouette with slight pulmonary
vascular congestion.

Mediastinal contours normal.

Slight chronic prominence of interstitial markings, unchanged.

No definite acute infiltrate, pleural effusion or pneumothorax.

Scattered endplate spur formation thoracic spine.
IMPRESSION: Enlargement of cardiac silhouette with pulmonary vascular
congestion.

Slight chronic accentuation of interstitial markings without
definite acute abnormalities

## 2020-09-03 IMAGING — DX DG CHEST 1V PORT
1 series · 1 of 1 positions shown · non-contrast
Comparison: Portable exam 1761 hours compared to earlier two-view
exam of 7223 hours and to 06/05/2019

CLINICAL DATA: Hypoxia

EXAM:
PORTABLE CHEST 1 VIEW

[chest ap]
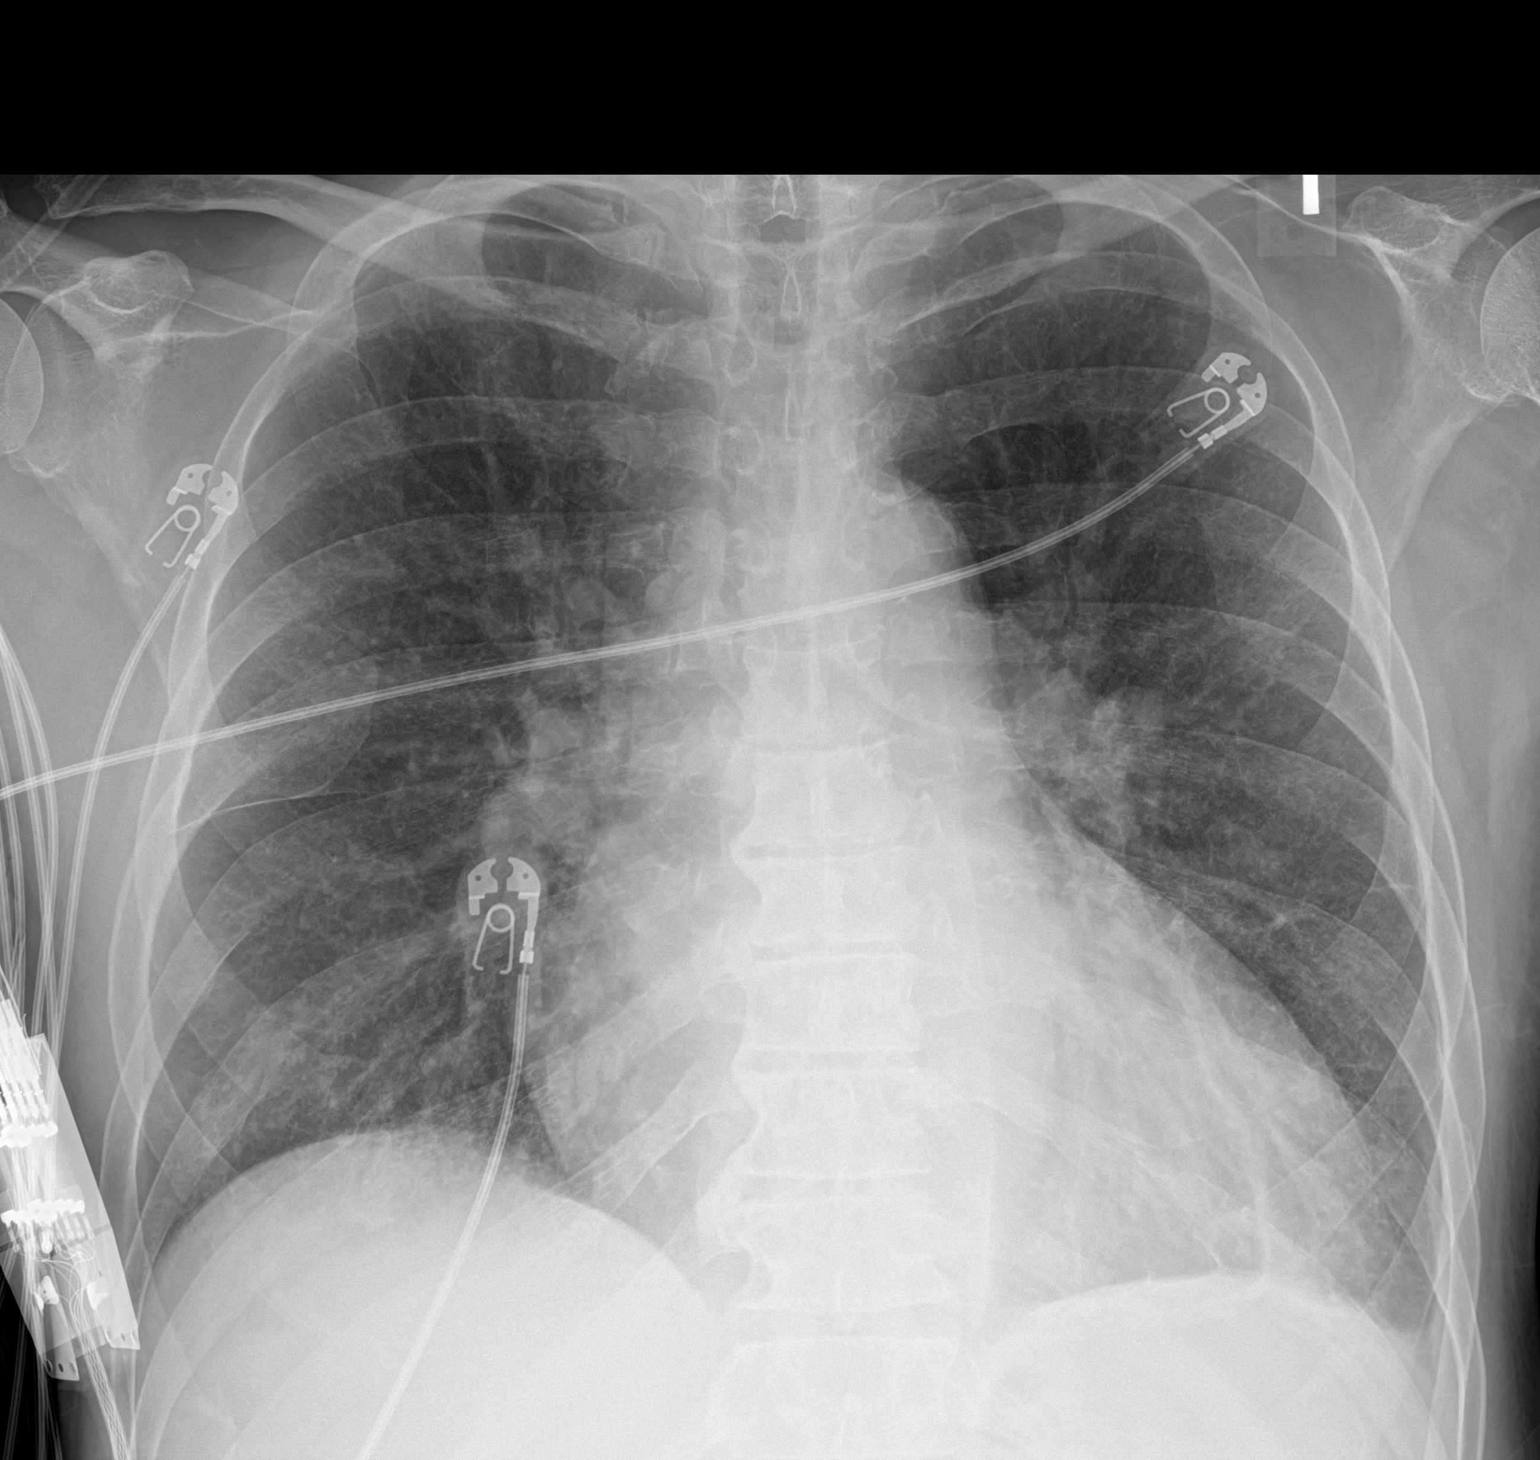

[1 of 1 positions shown; findings below may reference images not displayed]

FINDINGS: Enlargement of cardiac silhouette with pulmonary vascular
congestion.

Mediastinal contours normal.

Slight accentuation of interstitial markings, chronic.

No definite pulmonary infiltrate, pleural effusion or pneumothorax.

Osseous structures unremarkable.
IMPRESSION: Enlargement of cardiac silhouette with pulmonary vascular
congestion.

Slight chronic accentuation of interstitial markings without
definite acute infiltrate.

## 2020-09-04 ENCOUNTER — Observation Stay (HOSPITAL_COMMUNITY)
Admission: EM | Admit: 2020-09-04 | Discharge: 2020-09-05 | Disposition: A | Discharge status: Home / Self Care | Payer: Medicare Other | Attending: Family Medicine | Admitting: Family Medicine

## 2020-09-04 ENCOUNTER — Emergency Department (HOSPITAL_COMMUNITY): Payer: Medicare Other

## 2020-09-04 ENCOUNTER — Encounter (HOSPITAL_COMMUNITY): Payer: Self-pay | Admitting: Family Medicine

## 2020-09-04 DIAGNOSIS — Z79899 Other long term (current) drug therapy: Secondary | ICD-10-CM | POA: Insufficient documentation

## 2020-09-04 DIAGNOSIS — Z91158 Patient's noncompliance with renal dialysis for other reason: Secondary | ICD-10-CM

## 2020-09-04 DIAGNOSIS — Z9115 Patient's noncompliance with renal dialysis: Secondary | ICD-10-CM

## 2020-09-04 DIAGNOSIS — I132 Hypertensive heart and chronic kidney disease with heart failure and with stage 5 chronic kidney disease, or end stage renal disease: Secondary | ICD-10-CM | POA: Insufficient documentation

## 2020-09-04 DIAGNOSIS — Z20822 Contact with and (suspected) exposure to covid-19: Secondary | ICD-10-CM | POA: Insufficient documentation

## 2020-09-04 DIAGNOSIS — J810 Acute pulmonary edema: Secondary | ICD-10-CM | POA: Insufficient documentation

## 2020-09-04 DIAGNOSIS — R0602 Shortness of breath: Secondary | ICD-10-CM | POA: Diagnosis present

## 2020-09-04 DIAGNOSIS — Z992 Dependence on renal dialysis: Secondary | ICD-10-CM | POA: Diagnosis not present

## 2020-09-04 DIAGNOSIS — D631 Anemia in chronic kidney disease: Secondary | ICD-10-CM | POA: Diagnosis present

## 2020-09-04 DIAGNOSIS — N186 End stage renal disease: Secondary | ICD-10-CM | POA: Diagnosis not present

## 2020-09-04 DIAGNOSIS — R0902 Hypoxemia: Principal | ICD-10-CM | POA: Insufficient documentation

## 2020-09-04 DIAGNOSIS — I5032 Chronic diastolic (congestive) heart failure: Secondary | ICD-10-CM | POA: Diagnosis present

## 2020-09-04 DIAGNOSIS — J449 Chronic obstructive pulmonary disease, unspecified: Secondary | ICD-10-CM | POA: Diagnosis not present

## 2020-09-04 DIAGNOSIS — E875 Hyperkalemia: Secondary | ICD-10-CM | POA: Diagnosis not present

## 2020-09-04 DIAGNOSIS — E8770 Fluid overload, unspecified: Secondary | ICD-10-CM | POA: Diagnosis present

## 2020-09-04 DIAGNOSIS — I5033 Acute on chronic diastolic (congestive) heart failure: Secondary | ICD-10-CM | POA: Insufficient documentation

## 2020-09-04 DIAGNOSIS — I10 Essential (primary) hypertension: Secondary | ICD-10-CM | POA: Diagnosis present

## 2020-09-04 LAB — CBC WITH DIFFERENTIAL/PLATELET
Abs Immature Granulocytes: 0.03 10*3/uL (ref 0.00–0.07)
Basophils Absolute: 0 10*3/uL (ref 0.0–0.1)
Basophils Relative: 1 %
Eosinophils Absolute: 0 10*3/uL (ref 0.0–0.5)
Eosinophils Relative: 1 %
HCT: 24.4 % — ABNORMAL LOW (ref 39.0–52.0)
Hemoglobin: 7.7 g/dL — ABNORMAL LOW (ref 13.0–17.0)
Immature Granulocytes: 1 %
Lymphocytes Relative: 4 %
Lymphs Abs: 0.3 10*3/uL — ABNORMAL LOW (ref 0.7–4.0)
MCH: 31.7 pg (ref 26.0–34.0)
MCHC: 31.6 g/dL (ref 30.0–36.0)
MCV: 100.4 fL — ABNORMAL HIGH (ref 80.0–100.0)
Monocytes Absolute: 0.6 10*3/uL (ref 0.1–1.0)
Monocytes Relative: 10 %
Neutro Abs: 5.2 10*3/uL (ref 1.7–7.7)
Neutrophils Relative %: 83 %
Platelets: 123 10*3/uL — ABNORMAL LOW (ref 150–400)
RBC: 2.43 MIL/uL — ABNORMAL LOW (ref 4.22–5.81)
RDW: 16.6 % — ABNORMAL HIGH (ref 11.5–15.5)
WBC: 6.2 10*3/uL (ref 4.0–10.5)
nRBC: 0 % (ref 0.0–0.2)

## 2020-09-04 LAB — BASIC METABOLIC PANEL
Anion gap: 18 — ABNORMAL HIGH (ref 5–15)
BUN: 111 mg/dL — ABNORMAL HIGH (ref 8–23)
CO2: 20 mmol/L — ABNORMAL LOW (ref 22–32)
Calcium: 9.5 mg/dL (ref 8.9–10.3)
Chloride: 102 mmol/L (ref 98–111)
Creatinine, Ser: 10.55 mg/dL — ABNORMAL HIGH (ref 0.61–1.24)
GFR, Estimated: 5 mL/min — ABNORMAL LOW (ref >60)
Glucose, Bld: 93 mg/dL (ref 70–99)
Potassium: 6.3 mmol/L (ref 3.5–5.1)
Sodium: 140 mmol/L (ref 135–145)

## 2020-09-04 LAB — RESP PANEL BY RT-PCR (FLU A&B, COVID) ARPGX2
Influenza A by PCR: NEGATIVE
Influenza B by PCR: NEGATIVE
SARS Coronavirus 2 by RT PCR: NEGATIVE

## 2020-09-04 MED ORDER — HEPARIN SODIUM (PORCINE) 1000 UNIT/ML DIALYSIS: 1000.0000 [IU] | INTRAMUSCULAR | Status: DC | PRN

## 2020-09-04 MED ORDER — LIDOCAINE HCL (PF) 1 % IJ SOLN: 5.0000 mL | INTRAMUSCULAR | Status: DC | PRN

## 2020-09-04 MED ORDER — LIDOCAINE-PRILOCAINE 2.5-2.5 % EX CREA: 1.0000 "application " | TOPICAL_CREAM | CUTANEOUS | Status: DC | PRN

## 2020-09-04 MED ORDER — SODIUM CHLORIDE 0.9 % IV SOLN: 100.0000 mL | INTRAVENOUS | Status: DC | PRN

## 2020-09-04 MED ORDER — CHLORHEXIDINE GLUCONATE CLOTH 2 % EX PADS: 6.0000 | MEDICATED_PAD | Freq: Every day | CUTANEOUS | Status: DC

## 2020-09-04 MED ORDER — HEPARIN SODIUM (PORCINE) 5000 UNIT/ML IJ SOLN
5000.0000 [IU] | Freq: Three times a day (TID) | INTRAMUSCULAR | Status: DC
  Filled 2020-09-04: qty 1

## 2020-09-04 MED ORDER — ALTEPLASE 2 MG IJ SOLR: 2.0000 mg | Freq: Once | INTRAMUSCULAR | Status: DC | PRN

## 2020-09-04 MED ORDER — SODIUM ZIRCONIUM CYCLOSILICATE 10 G PO PACK
10.0000 g | PACK | Freq: Once | ORAL | Status: AC
  Administered 2020-09-04: 10 g via ORAL
  Filled 2020-09-04: qty 1

## 2020-09-04 MED ORDER — DARBEPOETIN ALFA 100 MCG/0.5ML IJ SOSY
100.0000 ug | PREFILLED_SYRINGE | INTRAMUSCULAR | Status: DC
  Administered 2020-09-04: 100 ug via INTRAVENOUS

## 2020-09-04 MED ORDER — DARBEPOETIN ALFA 100 MCG/0.5ML IJ SOSY
PREFILLED_SYRINGE | INTRAMUSCULAR | Status: AC
  Filled 2020-09-04: qty 0.5

## 2020-09-04 MED ORDER — PENTAFLUOROPROP-TETRAFLUOROETH EX AERO: 1.0000 "application " | INHALATION_SPRAY | CUTANEOUS | Status: DC | PRN

## 2020-09-04 NOTE — Progress Notes (Signed)
Patient suddenly get up from the stretcher and wants to go home. I tried to talked to him but he insisted to go back to the stretcher and patient left the unit.

## 2020-09-04 NOTE — Procedures (Signed)
Pt w/o outpatient HD Unit at this time. Here for HD.  Pt w/o complaints.    I was present at this dialysis session. I have reviewed the session itself and made appropriate changes.   K 6.3 on 2K bath. UF goal 4L, has 3+ LEE.    Hb 7.7 will give aranesp today.    No acute / inpatient renal issues, ok for DC to return to ED for next HD tx.   There were no vitals filed for this visit.  Recent Labs  Lab 09/04/20 0450  NA 140  K 6.3*  CL 102  CO2 20*  GLUCOSE 93  BUN 111*  CREATININE 10.55*  CALCIUM 9.5    Recent Labs  Lab 09/04/20 0450  WBC 6.2  NEUTROABS 5.2  HGB 7.7*  HCT 24.4*  MCV 100.4*  PLT 123*    Scheduled Meds: . Chlorhexidine Gluconate Cloth  6 each Topical Q0600  . heparin  5,000 Units Subcutaneous Q8H   Continuous Infusions: PRN Meds:.   Pearson Grippe  MD 09/04/2020, 8:17 AM

## 2020-09-04 NOTE — Discharge Summary (Signed)
This is a no charge note.  The patient is a 60 y.o. M with ESRD on HD, hx CVA and HTN who presented with few days progressive SOB, cough, and swelling and his last dialysis session over 10 days ago.  In the ER, the case was discussed with Nephrology who recommended dialysis.  The case was discussed with my partner Dr. Cyd Silence who accepted the patient into observation status for dialysis and then anticipated discharge.  Before I could assess the patient, he finished enough dialysis to his liking, asked to be taken off the machine, and left the building without waiting for staff to arrange transport, show him out or notify me.  I did not learn that he had left until he was out of the building and had no opportunity to assess him for clinical readiness for discharge.

## 2020-09-04 NOTE — ED Provider Notes (Signed)
[facility] EMERGENCY DEPARTMENT Provider Note   CSN: [id number] Arrival date & time: 09/04/20  1700     History Chief Complaint  Patient presents with  . Shortness of Breath    Thomas Robinson is a 60 y.o. male.  HPI     This is a 60 year old male with history of chronic kidney disease, diastolic heart failure, COPD who presents with shortness of breath.  Patient reports worsening shortness of breath over the last several days.  He last dialyzed Wednesday of last week.  Currently he does not have an outpatient dialysis center.  He was admitted to the hospital on Christmas day at [facility].  He subsequently was dialyzed.  He did have a cardiac catheterization which was clean per chart review.  Patient has noted increasing lower extremity edema and shortness of breath over last several days.  He has had cough.  No documented fevers but has had chills.  No known sick contacts or Covid exposures.  He is not vaccinated against COVID-19.  Per EMS, patient noted to be hypoxic in route.  On nasal cannula.  Not normally on home oxygen.  Past Medical History:  Diagnosis Date  . Acute on chronic diastolic CHF (congestive heart failure) ([place]) 04/13/2018  . Acute pulmonary edema ([place]) 01/23/2020  . Acute respiratory failure with hypoxia ([place]) 06/04/2019  . Anaphylactic shock, unspecified, sequela 06/10/2019  . Arthritis   . Asthma, chronic, unspecified asthma severity, with acute exacerbation 10/23/2017  . Atypical chest pain 02/16/2018  . CAP (community acquired pneumonia) 10/23/2017  . Carpal tunnel syndrome of right wrist 10/05/2018  . Cataract    right - removed by surgery  . Cerebral infarction due to thrombosis of cerebral artery ([place])   . Cervical disc herniation 01/04/2020  . Chest pain 04/13/2018  . Chronic diastolic heart failure ([place]) 04/13/2018  . Chronic low back pain 11/24/2019  . Constipation   . Cough    chronic cough  . Demand ischemia ([place]) 06/18/2020   . Dyspnea    "all the time" per patient at PAT 12/30/19  RA sats 87%, no oxygen use  . Encounter for immunization 07/08/2017  . ESRD on hemodialysis ([place]) 02/16/2018   Dialysis  T Thus Sat  - [facility]   . Fall 06/04/2019  . Fall at home, initial encounter 06/04/2019  . GERD (gastroesophageal reflux disease) 06/04/2019  . GI bleeding 05/24/2017  . Gram-negative sepsis, unspecified ([place]) 09/05/2017  . History of completed stroke 2018  . History of fusion of cervical spine 03/26/2020  . Hyperlipidemia   . Hypertension   . Hypertensive urgency 05/24/2017  . Hypokalemia 06/04/2017  . Iron deficiency anemia, unspecified 09/09/2017  . Left shoulder pain 11/11/2019  . Lumbar radiculopathy 11/24/2019  . Lung contusion 06/04/2019  . LVH (left ventricular hypertrophy) due to hypertensive disease, with heart failure ([place]) 06/18/2020  . Macrocytic anemia 05/24/2017  . Moderate protein-calorie malnutrition ([place]) 06/06/2017  . Myofascial pain syndrome 06/12/2020  . Neuritis of right ulnar nerve 09/13/2018  . Non-compliance with renal dialysis ([place]) 02/08/2020  . Other hyperlipidemia 06/04/2017  . Oxygen deficiency 12/30/2019   O2 sats on RA 87% at PAT appt   . Pain in joint of right elbow 09/13/2018  . Renovascular hypertension 06/22/2020  . Respiratory failure, acute ([place]) 10/23/2017  . Restless leg syndrome   . Rib fractures 06/2019   Right  . Secondary hyperparathyroidism of renal origin ([place]) 06/04/2017  . Symptomatic anemia 05/24/2017  .  Thoracic ascending aortic aneurysm ([facility])    4.5 cm 06/04/19 CT  . Ulnar neuropathy 10/05/2018  . Volume overload 10/23/2017    Patient Active Problem List   Diagnosis Date Noted  . Hyperkalemia 08/05/2020  . Arthritis   . Asthma   . Cataract   . Cerebral infarction due to thrombosis of cerebral artery ([place])   . History of CVA (cerebrovascular accident)   . Chronic kidney disease   . Dyspnea   . Hyperlipidemia   . Hypertension   . Kidney failure   .  Restless leg syndrome   . Degenerative lumbar spinal stenosis 06/26/2020  . Diastolic dysfunction 50/38/8828  . Renovascular hypertension 06/22/2020  . Demand ischemia ([place]) 06/18/2020  . LVH (left ventricular hypertrophy) due to hypertensive disease, with heart failure ([place]) 06/18/2020  . Myofascial pain syndrome 06/12/2020  . Encounter for removal of sutures 03/29/2020  . History of fusion of cervical spine 03/26/2020  . Non-compliance with renal dialysis ([place]) 02/08/2020  . Acute pulmonary edema ([place]) 01/23/2020  . Cervical disc herniation 01/04/2020  . Tachycardia 01/04/2020  . SOB (shortness of breath)   . Oxygen deficiency 12/30/2019  . Chronic low back pain 11/24/2019  . Degeneration of lumbar intervertebral disc 11/24/2019  . Lumbar radiculopathy 11/24/2019  . Left shoulder pain 11/11/2019  . Anaphylactic shock, unspecified, sequela 06/10/2019  . Rib fractures 06/05/2019  . Fall at home, initial encounter 06/04/2019  . Right rib fracture 06/04/2019  . Lung contusion 06/04/2019  . Acute respiratory failure with hypoxia ([place]) 06/04/2019  . Anemia in ESRD (end-stage renal disease) ([place]) 06/04/2019  . GERD (gastroesophageal reflux disease) 06/04/2019  . Chronic diastolic (congestive) heart failure ([place]) 06/04/2019  . Fall 06/04/2019  . Thoracic ascending aortic aneurysm ([place]) 06/04/2019  . Acute exacerbation of congestive heart failure ([place]) 05/16/2019  . Carpal tunnel syndrome of right wrist 10/05/2018  . Ulnar neuropathy 10/05/2018  . Neuritis of right ulnar nerve 09/13/2018  . Pain in joint of right elbow 09/13/2018  . Chest pain 04/13/2018  . Chronic diastolic heart failure ([place]) 04/13/2018  . Acute on chronic diastolic CHF (congestive heart failure) ([place]) 04/13/2018  . Chest discomfort   . Atypical chest pain 02/16/2018  . ESRD on dialysis ([place]) 02/16/2018  . ESRD (end stage renal disease) on dialysis ([place]) 02/16/2018  . Volume overload 10/23/2017  . CAP (community  acquired pneumonia) 10/23/2017  . Asthma, chronic, unspecified asthma severity, with acute exacerbation 10/23/2017  . Respiratory failure, acute ([place]) 10/23/2017  . Iron deficiency anemia, unspecified 09/09/2017  . Gram-negative sepsis, unspecified ([place]) 09/05/2017  . Cough 08/23/2017  . Encounter for immunization 07/08/2017  . Moderate protein-calorie malnutrition ([place]) 06/06/2017  . Coagulation defect, unspecified ([place]) 06/04/2017  . Hypokalemia 06/04/2017  . Other hyperlipidemia 06/04/2017  . Secondary hyperparathyroidism of renal origin ([place]) 06/04/2017  . Constipation   . Macrocytic anemia 05/24/2017  . Uremia 05/24/2017  . Hypertensive urgency 05/24/2017  . Acute kidney injury superimposed on CKD ([place]) 05/24/2017  . CKD (chronic kidney disease), stage V ([place]) 05/24/2017  . GI bleeding 05/24/2017  . Symptomatic anemia 05/24/2017  . Stroke ([place]) 2018  . AKI (acute kidney injury) ([place]) 04/10/2015  . History of completed stroke   . Essential hypertension 04/09/2015    Past Surgical History:  Procedure Laterality Date  . ANTERIOR CERVICAL DECOMP/DISCECTOMY FUSION N/A 01/04/2020   Procedure: ANTERIOR CERVICAL DECOMPRESSION/DISCECTOMY FUSION CERVICAL FIVE THROUGH SEVEN;  Surgeon: Melina Schools, MD;  Location: [facility];  Service: Orthopedics;  Laterality:  N/A;  3 hrs  . AV FISTULA PLACEMENT Left 05/28/2017   Procedure: LEFT ARM ARTERIOVENOUS (AV) FISTULA CREATION;  Surgeon: Conrad Whiting, MD;  Location: [facility];  Service: Vascular;  Laterality: Left;  . EYE SURGERY Right 06/02/2019   Cataract removed  . FRACTURE SURGERY    . IR FLUORO GUIDE CV LINE RIGHT  05/25/2017  . IR US GUIDE VASC ACCESS RIGHT  05/25/2017  . REVISON OF ARTERIOVENOUS FISTULA Left 07/18/2019   Procedure: REVISION PLICATION OF RADIOCEPHALIC ARTERIOVENOUS FISTULA LEFT ARM;  Surgeon: Angelia Mould, MD;  Location: [facility];  Service: Vascular;  Laterality: Left;  . RINOPLASTY         Family History  Problem  Relation Age of Onset  . Cancer Mother     Social History   Tobacco Use  . Smoking status: Never Smoker  . Smokeless tobacco: Never Used  Vaping Use  . Vaping Use: Never used  Substance Use Topics  . Alcohol use: Not Currently    Alcohol/week: 0.0 standard drinks    Comment: has a drink once in a while- 12/30/19  . Drug use: No    Home Medications Prior to Admission medications   Medication Sig Start Date End Date Taking? Authorizing Provider  albuterol (VENTOLIN HFA) 108 (90 Base) MCG/ACT inhaler Inhale 2 puffs into the lungs every 6 (six) hours as needed for wheezing or shortness of breath.     [provider]  amLODipine (NORVASC) 10 MG tablet Take 10 mg by mouth daily.     [provider]  atorvastatin (LIPITOR) 20 MG tablet Take 20 mg by mouth daily. 04/19/20   [provider]  AURYXIA 1 GM 210 MG(Fe) tablet Take 420 mg by mouth 3 (three) times daily with meals.  03/16/20   [provider]  BREO ELLIPTA 100-25 MCG/INH AEPB Inhale 1 puff into the lungs daily. 06/26/20   [provider]  cholecalciferol (VITAMIN D3) 25 MCG (1000 UNIT) tablet Take 1,000 Units by mouth daily.    [provider]  furosemide (LASIX) 40 MG tablet Take 1 tablet (40 mg total) by mouth See admin instructions. On Sunday, Monday, Wednesday, Thursday and Saturday 08/14/20   Tobb, Kardie, DO  gabapentin (NEURONTIN) 300 MG capsule Take 300 mg by mouth 2 (two) times daily as needed (pain).     [provider]  hydrALAZINE (APRESOLINE) 25 MG tablet Take 50 mg by mouth 3 (three) times daily.    [provider]  metoprolol tartrate (LOPRESSOR) 100 MG tablet Take 100 mg by mouth 2 (two) times daily.     [provider]  multivitamin (RENA-VIT) TABS tablet Take 1 tablet by mouth daily. 01/12/18   [provider]  sevelamer carbonate (RENVELA) 800 MG tablet Take 800 mg by mouth daily. 11/07/19   [provider]  Specialty  Vitamins Products (HEALTHY HEART COMPLEX PO) Take 1 tablet by mouth daily.    [provider]    Allergies    Patient has no known allergies.  Review of Systems   Review of Systems  Constitutional: Positive for chills. Negative for fever.  Respiratory: Positive for cough and shortness of breath.   Cardiovascular: Positive for leg swelling. Negative for chest pain.  Gastrointestinal: Negative for abdominal pain, nausea and vomiting.  Genitourinary: Negative for dysuria.  All other systems reviewed and are negative.   Physical Exam Updated Vital Signs BP (!) 167/85   Pulse 69   Temp 98.1 F (36.7 C) (  Oral)   Resp 15   SpO2 97%   Physical Exam Vitals and nursing note reviewed.  Constitutional:      Appearance: He is well-developed and well-nourished. He is not ill-appearing.     Comments: Chronically ill-appearing, no acute distress  HENT:     Head: Normocephalic and atraumatic.     Nose: Nose normal.     Mouth/Throat:     Mouth: Mucous membranes are moist.  Eyes:     Pupils: Pupils are equal, round, and reactive to light.  Cardiovascular:     Rate and Rhythm: Normal rate and regular rhythm.     Heart sounds: Normal heart sounds. No murmur heard.   Pulmonary:     Effort: Pulmonary effort is normal. No respiratory distress.     Breath sounds: Normal breath sounds. No wheezing.     Comments: Rales bilateral lung bases Abdominal:     General: Bowel sounds are normal.     Palpations: Abdomen is soft.     Tenderness: There is no abdominal tenderness. There is no rebound.  Musculoskeletal:     Cervical back: Neck supple.     Right lower leg: Edema present.     Left lower leg: Edema present.     Comments: 2+ bilateral pitting edema  Lymphadenopathy:     Cervical: No cervical adenopathy.  Skin:    General: Skin is warm and dry.  Neurological:     Mental Status: He is alert and oriented to person, place, and time.  Psychiatric:        Mood and Affect: Mood  and affect and mood normal.     ED Results / Procedures / Treatments   Labs (all labs ordered are listed, but only abnormal results are displayed) Labs Reviewed  CBC WITH DIFFERENTIAL/PLATELET - Abnormal; Notable for the following components:      Result Value   RBC 2.43 (*)    Hemoglobin 7.7 (*)    HCT 24.4 (*)    MCV 100.4 (*)    RDW 16.6 (*)    Platelets 123 (*)    Lymphs Abs 0.3 (*)    All other components within normal limits  BASIC METABOLIC PANEL - Abnormal; Notable for the following components:   Potassium 6.3 (*)    CO2 20 (*)    BUN 111 (*)    Creatinine, Ser 10.55 (*)    GFR, Estimated 5 (*)    Anion gap 18 (*)    All other components within normal limits  RESP PANEL BY RT-PCR (FLU A&B, COVID) [id number]    EKG None  Radiology DG Chest Portable 1 View  Result Date: 09/04/2020 CLINICAL DATA:  Dyspnea EXAM: PORTABLE CHEST 1 VIEW COMPARISON:  08/13/2020 FINDINGS: The lungs are symmetrically well expanded. There is central pulmonary vascular congestion. There is superimposed diffuse mild interstitial pulmonary infiltrate possibly relating changes of mild diffuse pulmonary edema. No pneumothorax or pleural effusion. Cardiac size is mildly enlarged, unchanged. No acute bone abnormality. Cervical fusion hardware again noted. IMPRESSION: Central pulmonary vascular engorgement and trace interstitial pulmonary edema most in keeping with mild cardiogenic failure. Stable cardiomegaly. Electronically Signed   By: Fidela Salisbury MD   On: 09/04/2020 04:16    Procedures .Critical Care Performed by: Merryl Hacker, MD Authorized by: Merryl Hacker, MD   Critical care provider statement:    Critical care time (minutes):  40   Critical care time was exclusive of:  Separately billable procedures and treating other patients  Critical care was necessary to treat or prevent imminent or life-threatening deterioration of the following conditions:  Respiratory failure and metabolic  crisis   Critical care was time spent personally by me on the following activities:  Discussions with consultants, ordering and review of radiographic studies, ordering and review of laboratory studies and ordering and performing treatments and interventions   (including critical care time)  Medications Ordered in ED Medications - No data to display  ED Course  I have reviewed the triage vital signs and the nursing notes.  Pertinent labs & imaging results that were available during my care of the patient were reviewed by me and considered in my medical decision making (see chart for details).    MDM Rules/Calculators/A&P                           Patient presents with shortness of breath.  Appears volume overloaded.  Mild hypoxia requiring nasal cannula 3 L.  He has not dialyzed in 6 days.  EKG shows no acute ischemic or arrhythmic changes.  Lab work obtained.  Hemoglobin downtrending but has a history of anemia.  No obvious acute bleeding.  Likely anemia of chronic disease.  BMP with potassium of 6.3.  Patient was dosed Lokelma.  Chest x-ray shows findings consistent with pulmonary edema.  No evidence of pneumonia.  Covid testing negative.  Given his oxygen requirement, feel he may need more than one dialysis session for his pulmonary edema.  Will plan for admission to the hospitalist.  Spoke with Dr. Johnney Ou, nephrology.  WIll put on dialysis list.  No additional recs besides Lokelma for potassium.  Thomas Robinson was evaluated in Emergency Department on 09/04/2020 for the symptoms described in the history of present illness. He was evaluated in the context of the global COVID-19 pandemic, which necessitated consideration that the patient might be at risk for infection with the SARS-CoV-2 virus that causes COVID-19. Institutional protocols and algorithms that pertain to the evaluation of patients at risk for COVID-19 are in a state of rapid change based on information released by regulatory  bodies including the CDC and federal and state organizations. These policies and algorithms were followed during the patient's care in the ED.  Final Clinical Impression(s) / ED Diagnoses Final diagnoses:  Hypoxia  Acute pulmonary edema ([place])  Hyperkalemia    Rx / DC Orders ED Discharge Orders    None       Alyssia Heese, Barbette Hair, MD 09/04/20 0600

## 2020-09-04 NOTE — ED Triage Notes (Signed)
Pt BIB EMS from home. Has not been to dialysis since 12/25. Went for his dialysis appointment today but left without being seen due to the anticipated wait time. Increased SOB over past few days and now has a productive cough for roughly the past 24 hours. States to EMS that he also had a fever that is now resolved.   Possible rhonchi noted in lower R lung noted by EMS  Edema in Bilateral lower extremities and L arm L arm restricted  Denies pain with EMS.   EMS: 180/100 Initial sat 82% RA, 98% 4L [place] NSR at 75 Pulse 75 No temp taken, feels afebrile to the touch

## 2020-09-04 NOTE — ED Notes (Addendum)
Pt placed on 3L [place] after oxygen saturation (initially 98% RA) dropped to 89% RA. Now at 99%

## 2020-09-08 IMAGING — DX DG CHEST 1V PORT
1 series · 1 of 1 positions shown · non-contrast
Comparison: 12/30/2019

CLINICAL DATA: Preop for spinal surgery.

EXAM:
PORTABLE CHEST 1 VIEW

[chest ap]
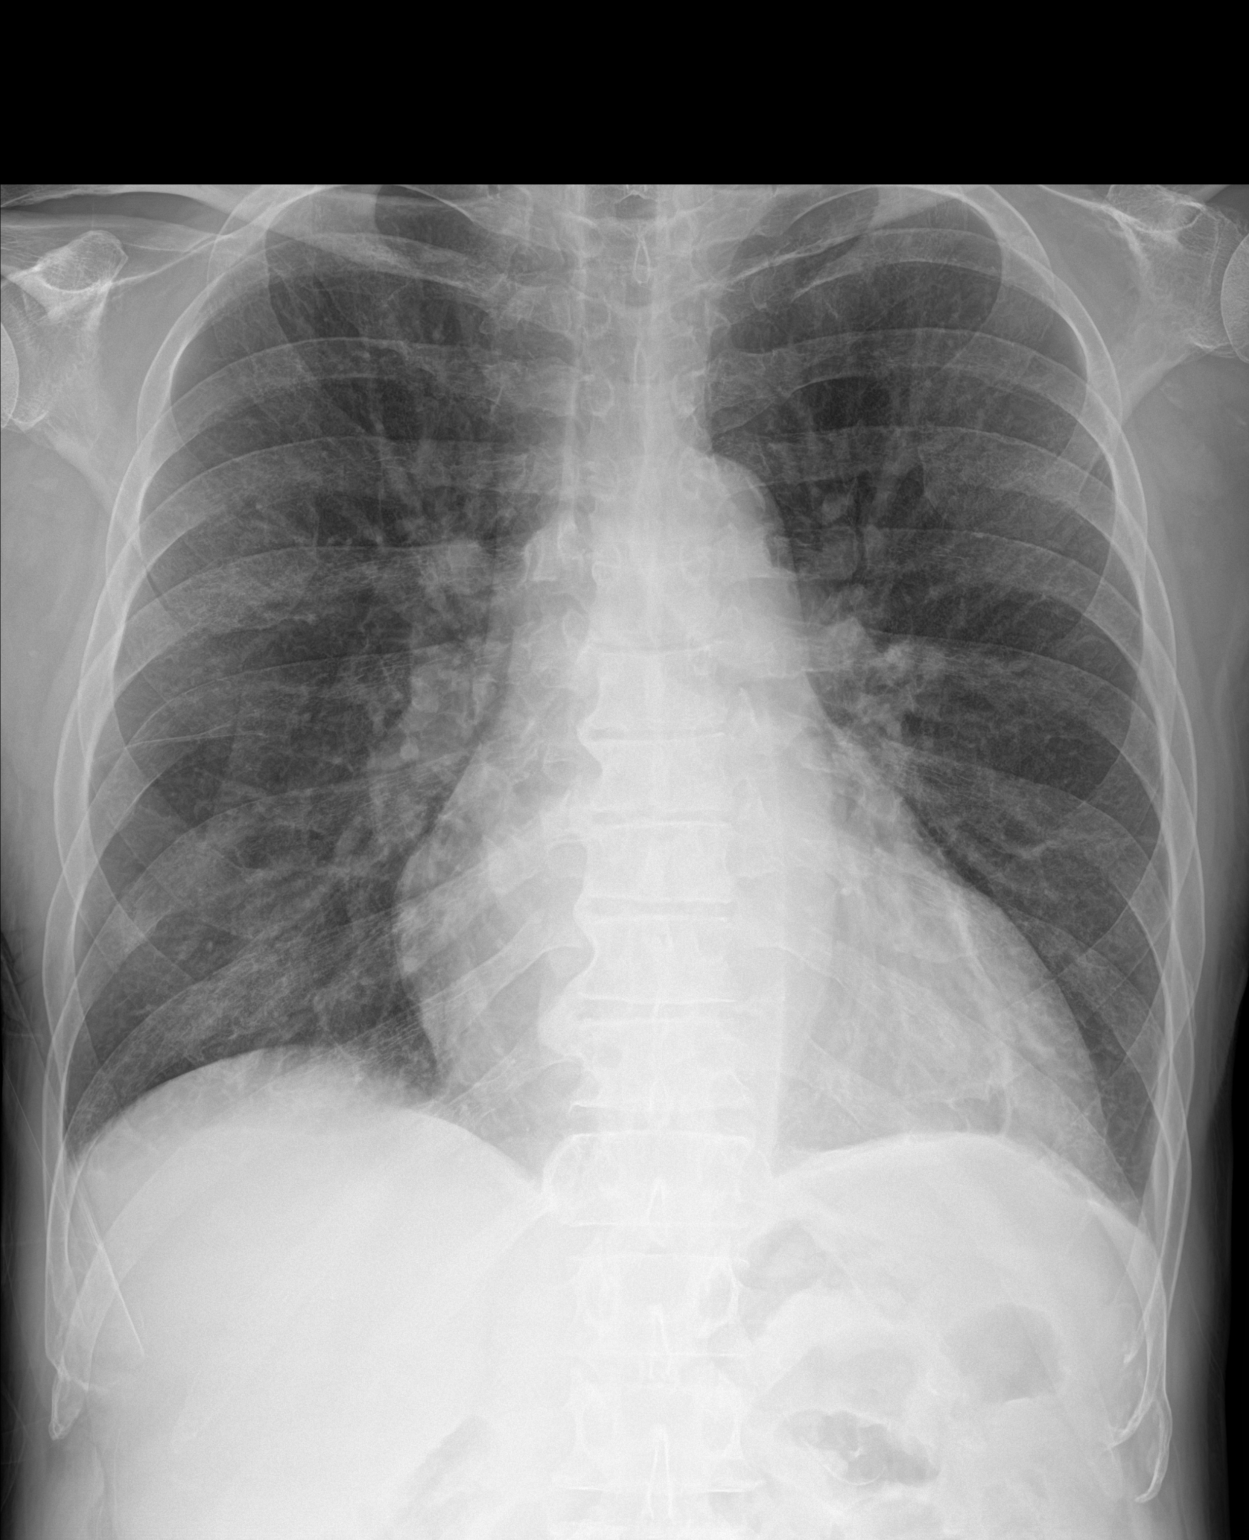

[1 of 1 positions shown; findings below may reference images not displayed]

FINDINGS: Borderline cardiac enlargement appears stable. Mild tortuosity and
calcification of the thoracic aorta. The lungs are clear. No pleural
effusions. No worrisome pulmonary lesions. The bony thorax is
intact.
IMPRESSION: No acute cardiopulmonary findings.

## 2020-09-08 IMAGING — RF DG C-ARM 1-60 MIN
1 series · 3 of 3 positions shown · non-contrast
Comparison: None.

CLINICAL DATA: Anterior cervical decompression/discectomy. Fusion
C5 through C7.

EXAM:
CERVICAL SPINE - 2-3 VIEW; DG C-ARM 1-60 MIN

[Series 1: run · 3 of 3 slices shown]
[im 1/3]
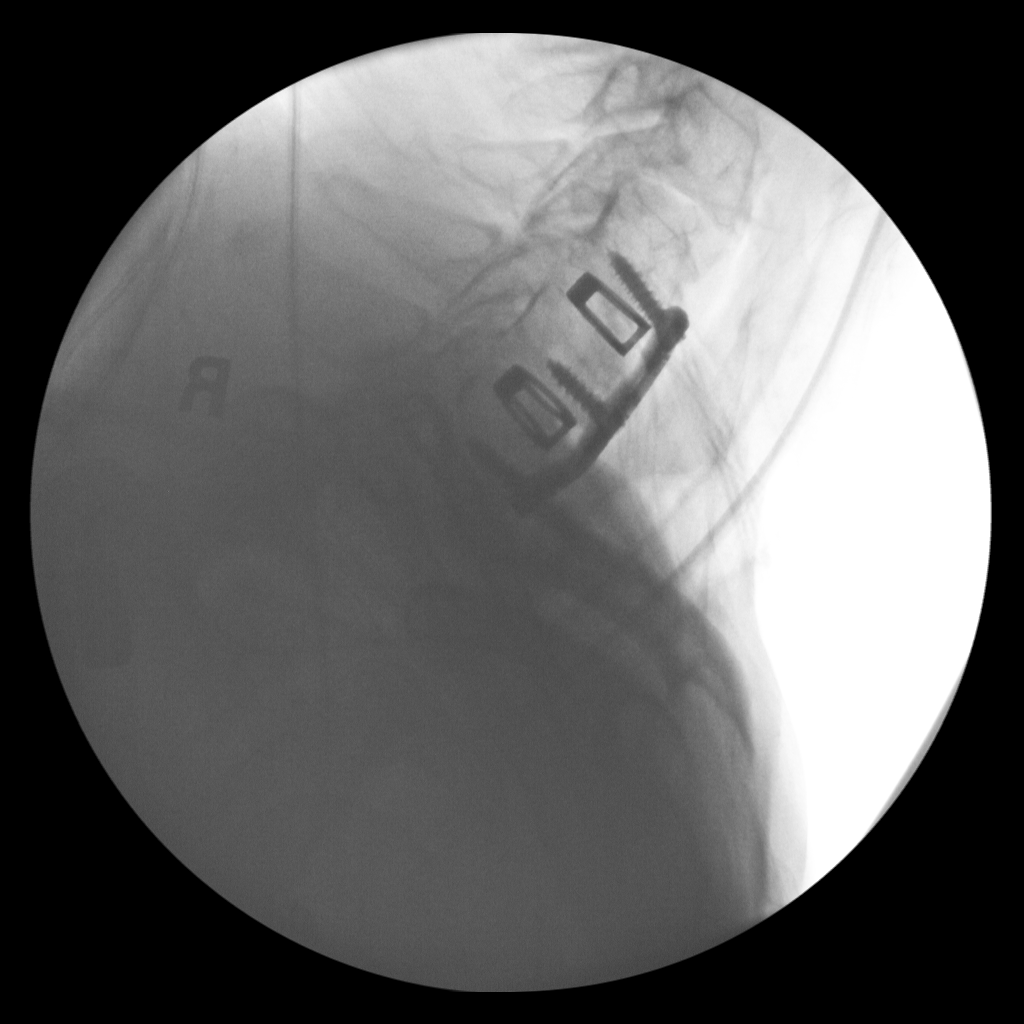
[im 2/3]
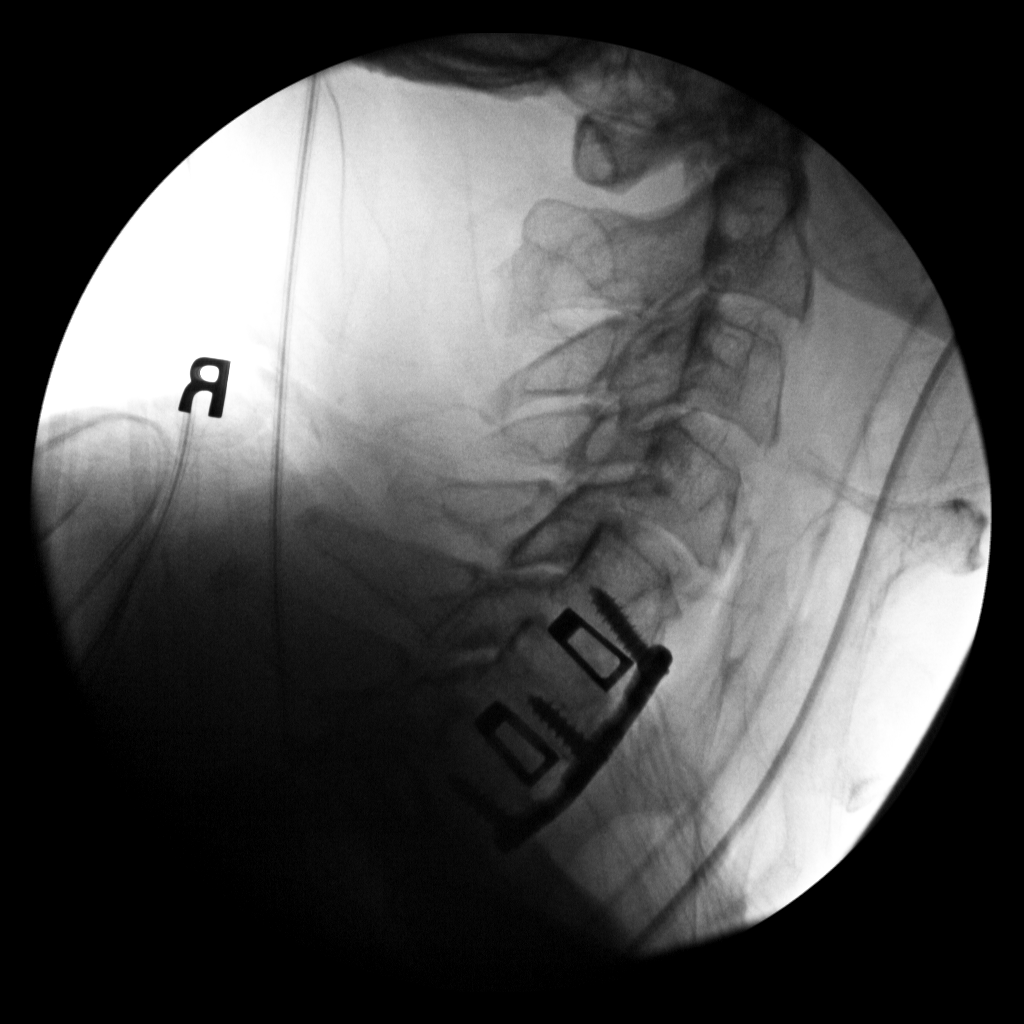
[im 3/3]
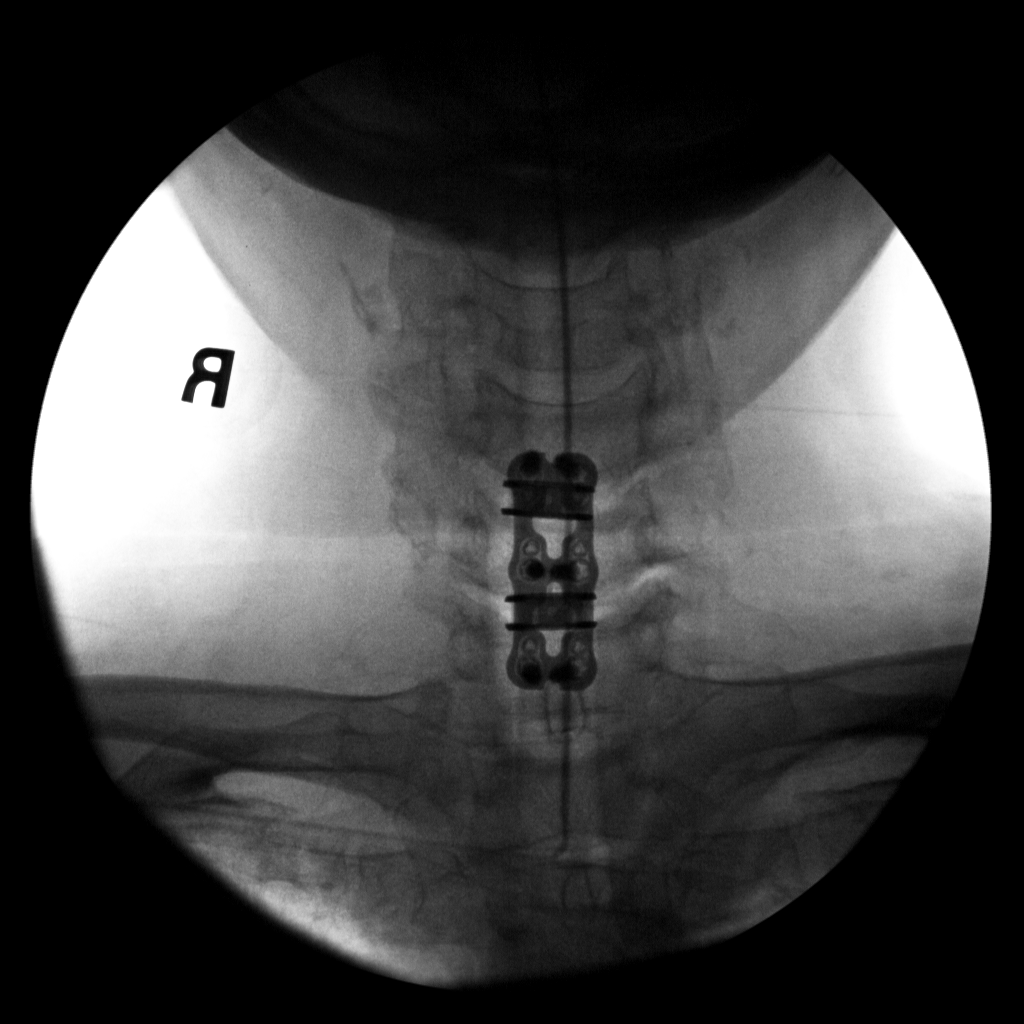

[3 of 3 positions shown; findings below may reference images not displayed]

FINDINGS: Three fluoroscopic spot views obtained in the operating room in
frontal and lateral projections. Anterior fusion with interbody
spacers at C5 through C7. Fluoroscopy time reported 2 minutes 12
seconds.
IMPRESSION: Fluoroscopic spot views after C5-C7 anterior fusion.

## 2020-09-08 IMAGING — RF DG CERVICAL SPINE 2 OR 3 VIEWS
1 series · 3 of 3 positions shown · non-contrast
Comparison: None.

CLINICAL DATA: Anterior cervical decompression/discectomy. Fusion
C5 through C7.

EXAM:
CERVICAL SPINE - 2-3 VIEW; DG C-ARM 1-60 MIN

[Series 1: run · 3 of 3 slices shown]
[im 1/3]
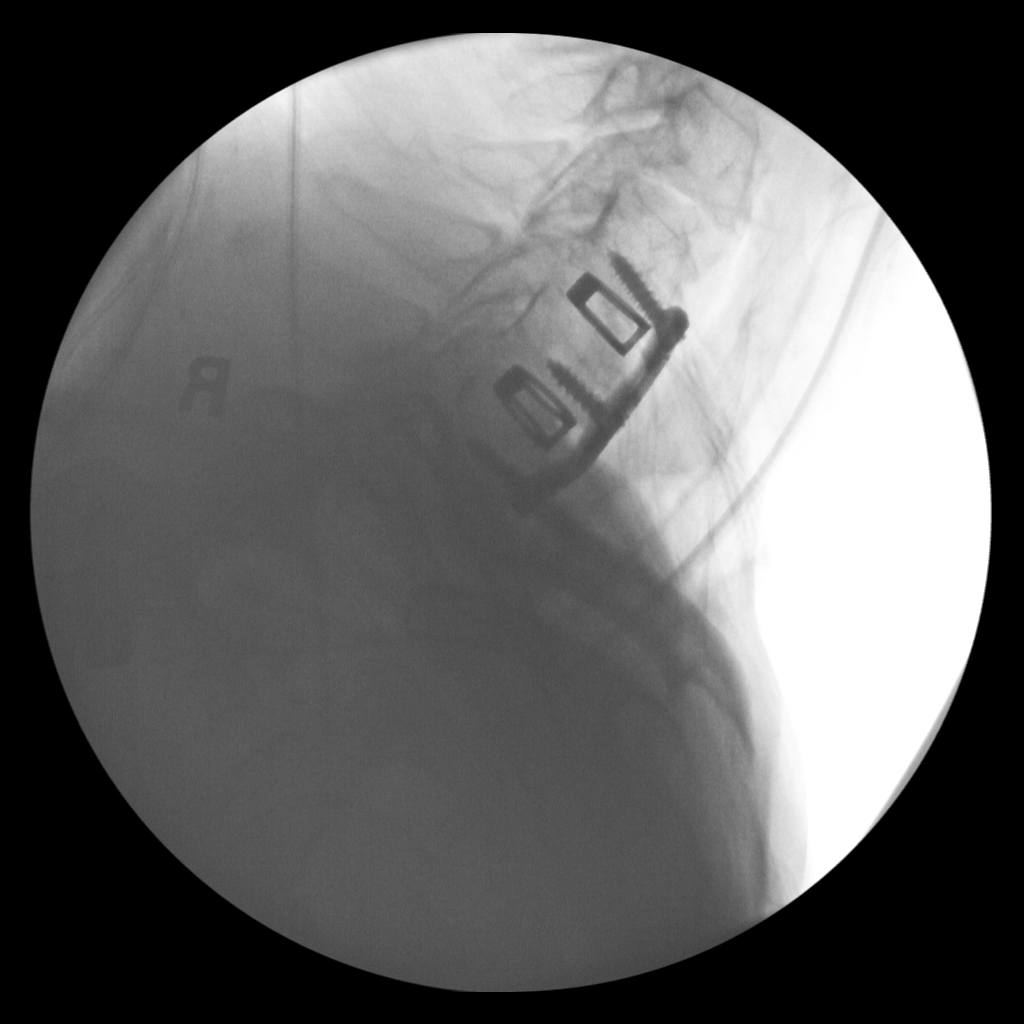
[im 2/3]
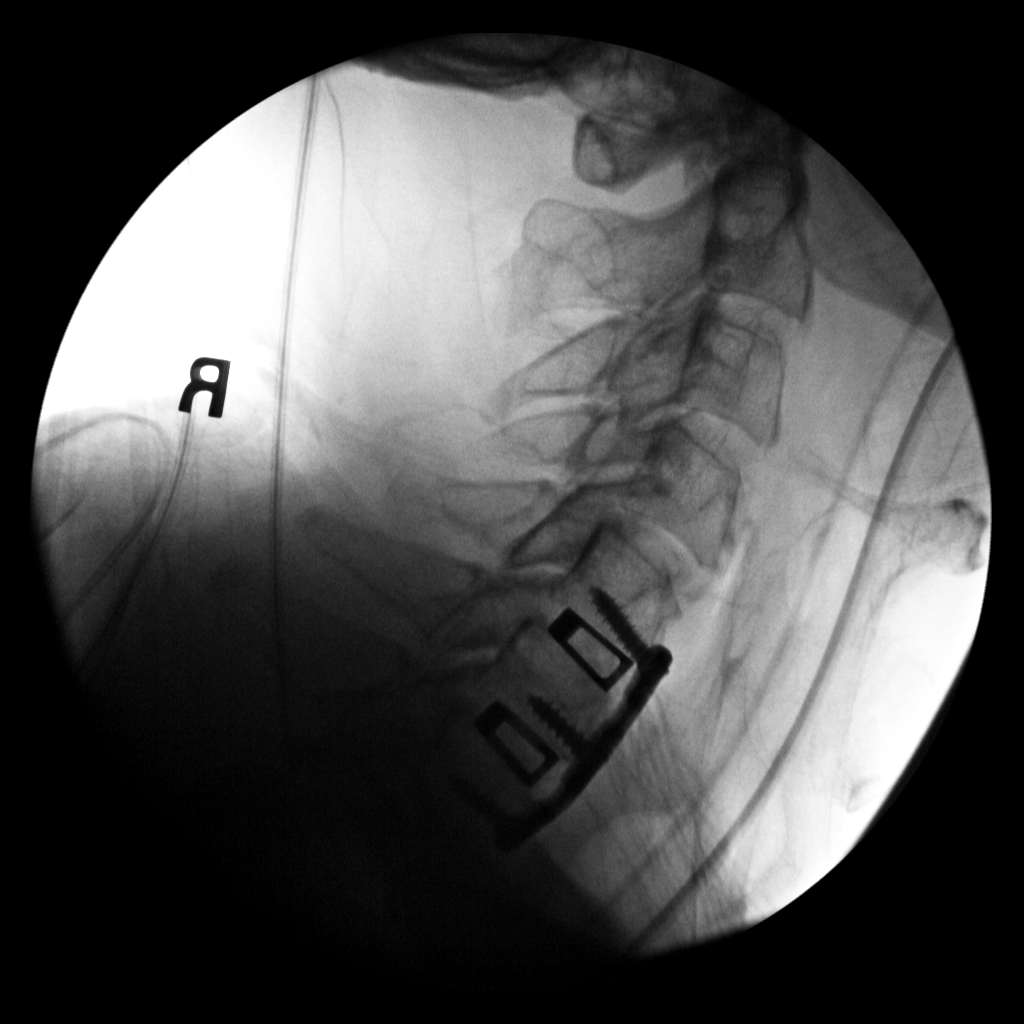
[im 3/3]
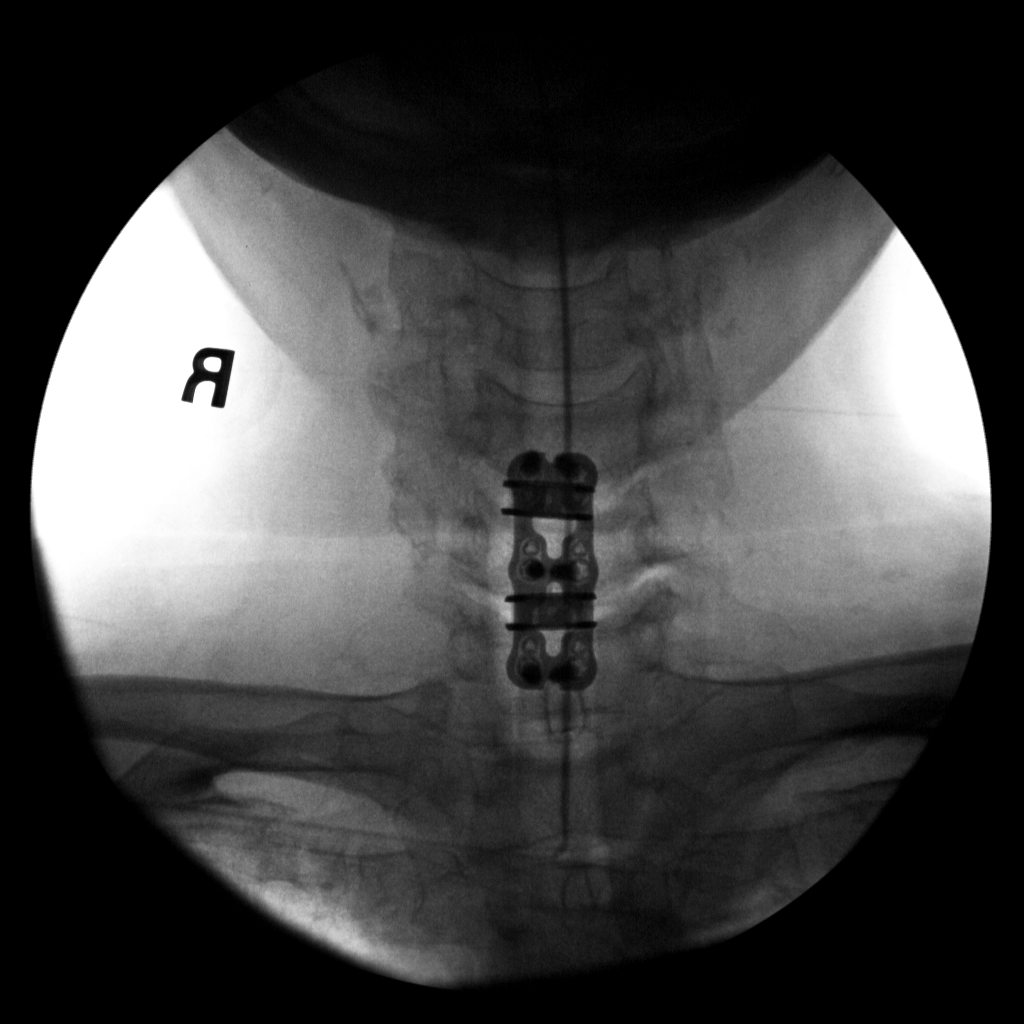

[3 of 3 positions shown; findings below may reference images not displayed]

FINDINGS: Three fluoroscopic spot views obtained in the operating room in
frontal and lateral projections. Anterior fusion with interbody
spacers at C5 through C7. Fluoroscopy time reported 2 minutes 12
seconds.
IMPRESSION: Fluoroscopic spot views after C5-C7 anterior fusion.

## 2020-09-09 ENCOUNTER — Encounter (HOSPITAL_COMMUNITY): Payer: Self-pay | Admitting: Emergency Medicine

## 2020-09-09 ENCOUNTER — Inpatient Hospital Stay (HOSPITAL_COMMUNITY)
Admission: EM | Admit: 2020-09-09 | Discharge: 2020-09-12 | DRG: 291 | Discharge status: Against Medical Advice | Payer: Medicare Other | Attending: Internal Medicine | Admitting: Internal Medicine

## 2020-09-09 ENCOUNTER — Other Ambulatory Visit: Payer: Self-pay

## 2020-09-09 ENCOUNTER — Emergency Department (HOSPITAL_COMMUNITY): Payer: Medicare Other

## 2020-09-09 DIAGNOSIS — I712 Thoracic aortic aneurysm, without rupture: Secondary | ICD-10-CM | POA: Diagnosis present

## 2020-09-09 DIAGNOSIS — K219 Gastro-esophageal reflux disease without esophagitis: Secondary | ICD-10-CM | POA: Diagnosis present

## 2020-09-09 DIAGNOSIS — R0602 Shortness of breath: Secondary | ICD-10-CM

## 2020-09-09 DIAGNOSIS — I160 Hypertensive urgency: Secondary | ICD-10-CM | POA: Diagnosis present

## 2020-09-09 DIAGNOSIS — G2581 Restless legs syndrome: Secondary | ICD-10-CM | POA: Diagnosis present

## 2020-09-09 DIAGNOSIS — E7849 Other hyperlipidemia: Secondary | ICD-10-CM | POA: Diagnosis present

## 2020-09-09 DIAGNOSIS — Z20822 Contact with and (suspected) exposure to covid-19: Secondary | ICD-10-CM | POA: Diagnosis present

## 2020-09-09 DIAGNOSIS — N186 End stage renal disease: Secondary | ICD-10-CM

## 2020-09-09 DIAGNOSIS — E875 Hyperkalemia: Secondary | ICD-10-CM | POA: Diagnosis not present

## 2020-09-09 DIAGNOSIS — I132 Hypertensive heart and chronic kidney disease with heart failure and with stage 5 chronic kidney disease, or end stage renal disease: Principal | ICD-10-CM | POA: Diagnosis present

## 2020-09-09 DIAGNOSIS — E8770 Fluid overload, unspecified: Secondary | ICD-10-CM

## 2020-09-09 DIAGNOSIS — J810 Acute pulmonary edema: Secondary | ICD-10-CM

## 2020-09-09 DIAGNOSIS — Z5329 Procedure and treatment not carried out because of patient's decision for other reasons: Secondary | ICD-10-CM | POA: Diagnosis present

## 2020-09-09 DIAGNOSIS — R0902 Hypoxemia: Secondary | ICD-10-CM

## 2020-09-09 DIAGNOSIS — Z992 Dependence on renal dialysis: Secondary | ICD-10-CM

## 2020-09-09 DIAGNOSIS — Z79899 Other long term (current) drug therapy: Secondary | ICD-10-CM

## 2020-09-09 DIAGNOSIS — N2581 Secondary hyperparathyroidism of renal origin: Secondary | ICD-10-CM | POA: Diagnosis present

## 2020-09-09 DIAGNOSIS — Z7951 Long term (current) use of inhaled steroids: Secondary | ICD-10-CM

## 2020-09-09 DIAGNOSIS — Z8673 Personal history of transient ischemic attack (TIA), and cerebral infarction without residual deficits: Secondary | ICD-10-CM

## 2020-09-09 DIAGNOSIS — M7918 Myalgia, other site: Secondary | ICD-10-CM | POA: Diagnosis present

## 2020-09-09 DIAGNOSIS — I272 Pulmonary hypertension, unspecified: Secondary | ICD-10-CM | POA: Diagnosis present

## 2020-09-09 DIAGNOSIS — R600 Localized edema: Secondary | ICD-10-CM

## 2020-09-09 DIAGNOSIS — I352 Nonrheumatic aortic (valve) stenosis with insufficiency: Secondary | ICD-10-CM | POA: Diagnosis present

## 2020-09-09 DIAGNOSIS — M5416 Radiculopathy, lumbar region: Secondary | ICD-10-CM | POA: Diagnosis present

## 2020-09-09 DIAGNOSIS — D631 Anemia in chronic kidney disease: Secondary | ICD-10-CM | POA: Diagnosis present

## 2020-09-09 DIAGNOSIS — E872 Acidosis: Secondary | ICD-10-CM | POA: Diagnosis present

## 2020-09-09 DIAGNOSIS — R5381 Other malaise: Secondary | ICD-10-CM | POA: Diagnosis present

## 2020-09-09 DIAGNOSIS — Z9115 Patient's noncompliance with renal dialysis: Secondary | ICD-10-CM

## 2020-09-09 DIAGNOSIS — J9601 Acute respiratory failure with hypoxia: Secondary | ICD-10-CM | POA: Diagnosis not present

## 2020-09-09 DIAGNOSIS — I5033 Acute on chronic diastolic (congestive) heart failure: Secondary | ICD-10-CM | POA: Diagnosis present

## 2020-09-09 DIAGNOSIS — J811 Chronic pulmonary edema: Secondary | ICD-10-CM | POA: Diagnosis present

## 2020-09-09 DIAGNOSIS — Z981 Arthrodesis status: Secondary | ICD-10-CM

## 2020-09-09 DIAGNOSIS — M1990 Unspecified osteoarthritis, unspecified site: Secondary | ICD-10-CM | POA: Diagnosis present

## 2020-09-09 DIAGNOSIS — Z809 Family history of malignant neoplasm, unspecified: Secondary | ICD-10-CM

## 2020-09-09 LAB — CBC WITH DIFFERENTIAL/PLATELET
Abs Immature Granulocytes: 0.04 10*3/uL (ref 0.00–0.07)
Basophils Absolute: 0.1 10*3/uL (ref 0.0–0.1)
Basophils Relative: 1 %
Eosinophils Absolute: 0.1 10*3/uL (ref 0.0–0.5)
Eosinophils Relative: 1 %
HCT: 22.1 % — ABNORMAL LOW (ref 39.0–52.0)
Hemoglobin: 7.2 g/dL — ABNORMAL LOW (ref 13.0–17.0)
Immature Granulocytes: 1 %
Lymphocytes Relative: 5 %
Lymphs Abs: 0.3 10*3/uL — ABNORMAL LOW (ref 0.7–4.0)
MCH: 33.6 pg (ref 26.0–34.0)
MCHC: 32.6 g/dL (ref 30.0–36.0)
MCV: 103.3 fL — ABNORMAL HIGH (ref 80.0–100.0)
Monocytes Absolute: 0.4 10*3/uL (ref 0.1–1.0)
Monocytes Relative: 7 %
Neutro Abs: 4.9 10*3/uL (ref 1.7–7.7)
Neutrophils Relative %: 85 %
Platelets: 155 10*3/uL (ref 150–400)
RBC: 2.14 MIL/uL — ABNORMAL LOW (ref 4.22–5.81)
RDW: 16.8 % — ABNORMAL HIGH (ref 11.5–15.5)
WBC: 5.8 10*3/uL (ref 4.0–10.5)
nRBC: 0.3 % — ABNORMAL HIGH (ref 0.0–0.2)

## 2020-09-09 LAB — I-STAT CHEM 8, ED
BUN: >130 mg/dL — ABNORMAL HIGH (ref 8–23)
Calcium, Ion: 1.21 mmol/L (ref 1.15–1.40)
Chloride: 108 mmol/L (ref 98–111)
Creatinine, Ser: 11.9 mg/dL — ABNORMAL HIGH (ref 0.61–1.24)
Glucose, Bld: 89 mg/dL (ref 70–99)
HCT: 26 % — ABNORMAL LOW (ref 39.0–52.0)
Hemoglobin: 8.8 g/dL — ABNORMAL LOW (ref 13.0–17.0)
Potassium: 6 mmol/L — ABNORMAL HIGH (ref 3.5–5.1)
Sodium: 141 mmol/L (ref 135–145)
TCO2: 20 mmol/L — ABNORMAL LOW (ref 22–32)

## 2020-09-09 LAB — COMPREHENSIVE METABOLIC PANEL
ALT: 17 U/L (ref 0–44)
AST: 10 U/L — ABNORMAL LOW (ref 15–41)
Albumin: 3.6 g/dL (ref 3.5–5.0)
Alkaline Phosphatase: 69 U/L (ref 38–126)
Anion gap: 22 — ABNORMAL HIGH (ref 5–15)
BUN: 136 mg/dL — ABNORMAL HIGH (ref 8–23)
CO2: 17 mmol/L — ABNORMAL LOW (ref 22–32)
Calcium: 9.9 mg/dL (ref 8.9–10.3)
Chloride: 102 mmol/L (ref 98–111)
Creatinine, Ser: 12.08 mg/dL — ABNORMAL HIGH (ref 0.61–1.24)
GFR, Estimated: 4 mL/min — ABNORMAL LOW (ref >60)
Glucose, Bld: 100 mg/dL — ABNORMAL HIGH (ref 70–99)
Potassium: 6.1 mmol/L — ABNORMAL HIGH (ref 3.5–5.1)
Sodium: 141 mmol/L (ref 135–145)
Total Bilirubin: 1 mg/dL (ref 0.3–1.2)
Total Protein: 6.7 g/dL (ref 6.5–8.1)

## 2020-09-09 LAB — LACTIC ACID, PLASMA: Lactic Acid, Venous: 1.3 mmol/L (ref 0.5–1.9)

## 2020-09-09 LAB — TROPONIN I (HIGH SENSITIVITY): Troponin I (High Sensitivity): 199 ng/L (ref <18)

## 2020-09-09 MED ORDER — SODIUM BICARBONATE 8.4 % IV SOLN
50.0000 meq | Freq: Once | INTRAVENOUS | Status: AC
  Administered 2020-09-09: 50 meq via INTRAVENOUS
  Filled 2020-09-09: qty 50

## 2020-09-09 MED ORDER — METOPROLOL TARTRATE 100 MG PO TABS
100.0000 mg | ORAL_TABLET | Freq: Two times a day (BID) | ORAL | Status: DC
  Administered 2020-09-10 – 2020-09-12 (×5): 100 mg via ORAL
  Filled 2020-09-09 (×5): qty 1

## 2020-09-09 MED ORDER — SEVELAMER CARBONATE 800 MG PO TABS
800.0000 mg | ORAL_TABLET | Freq: Three times a day (TID) | ORAL | Status: DC
  Administered 2020-09-10 – 2020-09-11 (×5): 800 mg via ORAL
  Filled 2020-09-09 (×5): qty 1

## 2020-09-09 MED ORDER — DARBEPOETIN ALFA 100 MCG/0.5ML IJ SOSY: 100.0000 ug | PREFILLED_SYRINGE | INTRAMUSCULAR | Status: DC

## 2020-09-09 MED ORDER — CALCIUM GLUCONATE-NACL 1-0.675 GM/50ML-% IV SOLN
1.0000 g | Freq: Once | INTRAVENOUS | Status: AC
  Administered 2020-09-09: 1000 mg via INTRAVENOUS
  Filled 2020-09-09: qty 50

## 2020-09-09 MED ORDER — ATORVASTATIN CALCIUM 10 MG PO TABS
20.0000 mg | ORAL_TABLET | Freq: Every day | ORAL | Status: DC
  Administered 2020-09-10 – 2020-09-12 (×3): 20 mg via ORAL
  Filled 2020-09-09 (×3): qty 2

## 2020-09-09 MED ORDER — HEPARIN SODIUM (PORCINE) 5000 UNIT/ML IJ SOLN
5000.0000 [IU] | Freq: Three times a day (TID) | INTRAMUSCULAR | Status: DC
  Filled 2020-09-09 (×2): qty 1

## 2020-09-09 MED ORDER — ACETAMINOPHEN 325 MG PO TABS
650.0000 mg | ORAL_TABLET | Freq: Four times a day (QID) | ORAL | Status: DC | PRN
  Administered 2020-09-10: 650 mg via ORAL
  Filled 2020-09-09 (×2): qty 2

## 2020-09-09 MED ORDER — AMLODIPINE BESYLATE 10 MG PO TABS
10.0000 mg | ORAL_TABLET | Freq: Every day | ORAL | Status: DC
  Administered 2020-09-10 – 2020-09-12 (×3): 10 mg via ORAL
  Filled 2020-09-09 (×3): qty 1

## 2020-09-09 MED ORDER — ACETAMINOPHEN 650 MG RE SUPP: 650.0000 mg | Freq: Four times a day (QID) | RECTAL | Status: DC | PRN

## 2020-09-09 MED ORDER — HYDRALAZINE HCL 50 MG PO TABS
50.0000 mg | ORAL_TABLET | Freq: Three times a day (TID) | ORAL | Status: DC
  Administered 2020-09-09 – 2020-09-12 (×7): 50 mg via ORAL
  Filled 2020-09-09 (×5): qty 1
  Filled 2020-09-09: qty 2
  Filled 2020-09-09: qty 1

## 2020-09-09 MED ORDER — ALBUTEROL SULFATE HFA 108 (90 BASE) MCG/ACT IN AERS
2.0000 | INHALATION_SPRAY | Freq: Four times a day (QID) | RESPIRATORY_TRACT | Status: DC | PRN
  Filled 2020-09-09: qty 6.7

## 2020-09-09 MED ORDER — FLUTICASONE FUROATE-VILANTEROL 100-25 MCG/INH IN AEPB
1.0000 | INHALATION_SPRAY | Freq: Every day | RESPIRATORY_TRACT | Status: DC
  Administered 2020-09-11 – 2020-09-12 (×2): 1 via RESPIRATORY_TRACT
  Filled 2020-09-09: qty 28

## 2020-09-09 MED ORDER — GABAPENTIN 300 MG PO CAPS
300.0000 mg | ORAL_CAPSULE | Freq: Every day | ORAL | Status: DC
  Administered 2020-09-10 – 2020-09-11 (×2): 300 mg via ORAL
  Filled 2020-09-09 (×2): qty 1

## 2020-09-09 MED ORDER — SODIUM ZIRCONIUM CYCLOSILICATE 10 G PO PACK
10.0000 g | PACK | Freq: Once | ORAL | Status: AC
  Administered 2020-09-09: 10 g via ORAL
  Filled 2020-09-09: qty 1

## 2020-09-09 MED ORDER — RENA-VITE PO TABS
1.0000 | ORAL_TABLET | Freq: Every day | ORAL | Status: DC
  Administered 2020-09-10 – 2020-09-12 (×3): 1 via ORAL
  Filled 2020-09-09 (×4): qty 1

## 2020-09-09 MED ORDER — HYDRALAZINE HCL 20 MG/ML IJ SOLN
10.0000 mg | INTRAMUSCULAR | Status: DC | PRN
  Administered 2020-09-09 – 2020-09-10 (×2): 10 mg via INTRAVENOUS
  Filled 2020-09-09 (×2): qty 1

## 2020-09-09 MED ORDER — CHLORHEXIDINE GLUCONATE CLOTH 2 % EX PADS: 6.0000 | MEDICATED_PAD | Freq: Every day | CUTANEOUS | Status: DC

## 2020-09-09 NOTE — Consult Note (Signed)
[facility] Renal Consultation Note  Requesting MD: Marda Stalker, MD Indication for Consultation:  ESRD, acute hypoxic resp failure   Chief complaint: "short of breath"   HPI:  Thomas Robinson is a 60 y.o. male with a history including end-stage renal disease, diastolic CHF, prior CVA, and hypertension who presented to the ER via EMS with shortness of breath.  He was placed on CPAP.  His sats were initially in the upper 70s per ER report.  I-STAT potassium with K6.0 CMP returned K6.1.  COVID test is pending and pt states he is not vaccinated. He has had a lot of fluid in his legs.  Note that he does not have an outpatient dialysis unit as he was discharged from local units in the past - he has been coming to [facility] for HD.  His last hemodialysis was here at [facility] 09/04/2020 - hasn't had HD in a week.  He hasn't had n/v or chest pain.    PMHx:   Past Medical History:  Diagnosis Date  . Acute on chronic diastolic CHF (congestive heart failure) ([place]) 04/13/2018  . Acute pulmonary edema ([place]) 01/23/2020  . Acute respiratory failure with hypoxia ([place]) 06/04/2019  . Anaphylactic shock, unspecified, sequela 06/10/2019  . Arthritis   . Asthma, chronic, unspecified asthma severity, with acute exacerbation 10/23/2017  . Atypical chest pain 02/16/2018  . CAP (community acquired pneumonia) 10/23/2017  . Carpal tunnel syndrome of right wrist 10/05/2018  . Cataract    right - removed by surgery  . Cerebral infarction due to thrombosis of cerebral artery ([place])   . Cervical disc herniation 01/04/2020  . Chest pain 04/13/2018  . Chronic diastolic heart failure ([place]) 04/13/2018  . Chronic low back pain 11/24/2019  . Constipation   . Cough    chronic cough  . Demand ischemia ([place]) 06/18/2020  . Dyspnea    "all the time" per patient at PAT 12/30/19  RA sats 87%, no oxygen use  . Encounter for immunization 07/08/2017  . ESRD on hemodialysis ([place]) 02/16/2018   Dialysis  T Thus Sat  - [facility]   . Fall 06/04/2019  . Fall at home, initial encounter 06/04/2019  . GERD (gastroesophageal reflux disease) 06/04/2019  . GI bleeding 05/24/2017  . Gram-negative sepsis, unspecified ([place]) 09/05/2017  . History of completed stroke 2018  . History of fusion of cervical spine 03/26/2020  . Hyperlipidemia   . Hypertension   . Hypertensive urgency 05/24/2017  . Hypokalemia 06/04/2017  . Iron deficiency anemia, unspecified 09/09/2017  . Left shoulder pain 11/11/2019  . Lumbar radiculopathy 11/24/2019  . Lung contusion 06/04/2019  . LVH (left ventricular hypertrophy) due to hypertensive disease, with heart failure ([place]) 06/18/2020  . Macrocytic anemia 05/24/2017  . Moderate protein-calorie malnutrition ([place]) 06/06/2017  . Myofascial pain syndrome 06/12/2020  . Neuritis of right ulnar nerve 09/13/2018  . Non-compliance with renal dialysis ([place]) 02/08/2020  . Other hyperlipidemia 06/04/2017  . Oxygen deficiency 12/30/2019   O2 sats on RA 87% at PAT appt   . Pain in joint of right elbow 09/13/2018  . Renovascular hypertension 06/22/2020  . Respiratory failure, acute ([place]) 10/23/2017  . Restless leg syndrome   . Rib fractures 06/2019   Right  . Secondary hyperparathyroidism of renal origin ([place]) 06/04/2017  . Symptomatic anemia 05/24/2017  . Thoracic ascending aortic aneurysm ([facility])    4.5 cm 06/04/19 CT  . Ulnar neuropathy 10/05/2018  . Volume overload 10/23/2017    Past Surgical  History:  Procedure Laterality Date  . ANTERIOR CERVICAL DECOMP/DISCECTOMY FUSION N/A 01/04/2020   Procedure: ANTERIOR CERVICAL DECOMPRESSION/DISCECTOMY FUSION CERVICAL FIVE THROUGH SEVEN;  Surgeon: Melina Schools, MD;  Location: [place];  Service: Orthopedics;  Laterality: N/A;  3 hrs  . AV FISTULA PLACEMENT Left 05/28/2017   Procedure: LEFT ARM ARTERIOVENOUS (AV) FISTULA CREATION;  Surgeon: Conrad Mayville, MD;  Location: [place];  Service: Vascular;  Laterality: Left;  . EYE SURGERY Right 06/02/2019   Cataract removed  .  FRACTURE SURGERY    . IR FLUORO GUIDE CV LINE RIGHT  05/25/2017  . IR US GUIDE VASC ACCESS RIGHT  05/25/2017  . REVISON OF ARTERIOVENOUS FISTULA Left 07/18/2019   Procedure: REVISION PLICATION OF RADIOCEPHALIC ARTERIOVENOUS FISTULA LEFT ARM;  Surgeon: Angelia Mould, MD;  Location: [place];  Service: Vascular;  Laterality: Left;  . RINOPLASTY      Family Hx:  Family History  Problem Relation Age of Onset  . Cancer Mother   denies family history of ESRD  Social History:  reports that he has never smoked. He has never used smokeless tobacco. He reports previous alcohol use. He reports that he does not use drugs.  Allergies: No Known Allergies  Medications: Prior to Admission medications   Medication Sig Start Date End Date Taking? Authorizing Provider  albuterol (VENTOLIN HFA) 108 (90 Base) MCG/ACT inhaler Inhale 2 puffs into the lungs every 6 (six) hours as needed for wheezing or shortness of breath.     [provider]  amLODipine (NORVASC) 10 MG tablet Take 10 mg by mouth daily.     [provider]  atorvastatin (LIPITOR) 20 MG tablet Take 20 mg by mouth daily. 04/19/20   [provider]  AURYXIA 1 GM 210 MG(Fe) tablet Take 420 mg by mouth 3 (three) times daily with meals.  03/16/20   [provider]  BREO ELLIPTA 100-25 MCG/INH AEPB Inhale 1 puff into the lungs daily. 06/26/20   [provider]  cholecalciferol (VITAMIN D3) 25 MCG (1000 UNIT) tablet Take 1,000 Units by mouth daily.    [provider]  furosemide (LASIX) 40 MG tablet Take 1 tablet (40 mg total) by mouth See admin instructions. On Sunday, Monday, Wednesday, Thursday and Saturday 08/14/20   Tobb, Kardie, DO  gabapentin (NEURONTIN) 300 MG capsule Take 300 mg by mouth 2 (two) times daily as needed (pain).     [provider]  hydrALAZINE (APRESOLINE) 25 MG tablet Take 50 mg by mouth 3 (three) times daily.    [provider]  metoprolol tartrate  (LOPRESSOR) 100 MG tablet Take 100 mg by mouth 2 (two) times daily.     [provider]  multivitamin (RENA-VIT) TABS tablet Take 1 tablet by mouth daily. 01/12/18   [provider]  sevelamer carbonate (RENVELA) 800 MG tablet Take 800 mg by mouth daily. 11/07/19   [provider]  Specialty Vitamins Products (HEALTHY HEART COMPLEX PO) Take 1 tablet by mouth daily.    [provider]    I have reviewed the patient's current and reported prior to admission medications.  Labs:  BMP Latest Ref Rng & Units 09/09/2020 09/09/2020 09/04/2020  Glucose 70 - 99 mg/dL 89 100(H) 93  BUN 8 - 23 mg/dL >130(H) 136(H) 111(H)  Creatinine 0.61 - 1.24 mg/dL 11.90(H) 12.08(H) 10.55(H)  BUN/Creat Ratio 10 - 24 - - -  Sodium 135 - 145 mmol/L 141 141 140  Potassium 3.5 - 5.1 mmol/L 6.0(H) 6.1(H) 6.3(HH)  Chloride 98 - 111 mmol/L 108 102 102  CO2 22 - 32 mmol/L - 17(L) 20(L)  Calcium 8.9 - 10.3 mg/dL - 9.9 9.5    ROS:  Pertinent items noted in HPI and remainder of comprehensive ROS otherwise negative. with limitations of BIPAP use   Physical Exam: Vitals:   09/09/20 2030 09/09/20 2100  BP: (!) 187/92 (!) 194/94  Pulse: 79 81  Resp: 17 19  Temp:    SpO2: 100% 100%     General: Adult male on stretcher on BiPAP HEENT: Normocephalic atraumatic Eyes: extraocular movements intact sclera anicteric Neck: Trachea midline; +JVD Heart: Regular rate and rhythm Lungs: Obscured by BiPAP unlabored at rest on the BiPAP Abdomen: Soft nontender slightly distended Extremities: Diffuse edema lower extremities 3+ no cyanosis or clubbing; left arm edema 2+ Skin: No rash on extremities exposed Neuro: Alert and interactive follows commands Left upper extremity AVF with thrill  Assessment/Plan:  # End-stage renal disease on hemodialysis -Dialysis when able; I have requested first shift 1/10 if not able to be done overnight - staffing limits availability of HD  # Acute hypoxic respiratory  failure - Secondary at least in part to fluid - Optimize volume status with hemodialysis when able - supportive care and oxygen/BiPAP per primary team  -Note that COVID test is pending  # Acute on Chronic diastolic CHF -Optimize volume status when able  # Hyperkalemia - Ordered Lokelma, sodium bicarbonate and calcium gluconate; HD when able  # Hypertension - Fluid overload; for HD  # Anemia of CKD - aranesp 100 mcg ordered for 8/41  # Metabolic bone disease - renal panel in AM   - multiple binders on list and concern re: compliance; renvela for now  Claudia Desanctis 09/09/2020, 9:41 PM

## 2020-09-09 NOTE — Progress Notes (Signed)
Patient trialed off BIPAP. Patient placed on 4L [place]; tolerating well at this time. Patient states he feels better and the nasal cannula is much more comfortable than the BIPAP mask. Informed patient that if he starts to feel more SOB again to let us know and he can go back on BIPAP.

## 2020-09-09 NOTE — ED Triage Notes (Addendum)
Patient arrived with EMS from home reports shortness of breath this evening , he missed all of  his hemodialysis treatment this week , productive cough , no fever or chills , unvaccinated against Covid19. BIPAP applied by RT at arrival.

## 2020-09-09 NOTE — ED Provider Notes (Signed)
[facility] EMERGENCY DEPARTMENT Provider Note   CSN: [id number] Arrival date & time: 09/09/20  1931     History Chief Complaint  Patient presents with  . Shortness of Breath    Missed Dialysis    Thomas Robinson is a 60 y.o. male.  The history is provided by the patient and medical records. No language interpreter was used.  Shortness of Breath Severity:  Severe Onset quality:  Gradual Duration:  1 day Timing:  Constant Progression:  Unchanged Chronicity:  Recurrent Context: URI   Relieved by:  Nothing Worsened by:  Exertion and coughing Ineffective treatments:  None tried Associated symptoms: chest pain and cough   Associated symptoms: no abdominal pain, no diaphoresis, no fever, no headaches, no rash, no sputum production, no vomiting and no wheezing        Past Medical History:  Diagnosis Date  . Acute on chronic diastolic CHF (congestive heart failure) ([place]) 04/13/2018  . Acute pulmonary edema ([place]) 01/23/2020  . Acute respiratory failure with hypoxia ([place]) 06/04/2019  . Anaphylactic shock, unspecified, sequela 06/10/2019  . Arthritis   . Asthma, chronic, unspecified asthma severity, with acute exacerbation 10/23/2017  . Atypical chest pain 02/16/2018  . CAP (community acquired pneumonia) 10/23/2017  . Carpal tunnel syndrome of right wrist 10/05/2018  . Cataract    right - removed by surgery  . Cerebral infarction due to thrombosis of cerebral artery ([place])   . Cervical disc herniation 01/04/2020  . Chest pain 04/13/2018  . Chronic diastolic heart failure ([place]) 04/13/2018  . Chronic low back pain 11/24/2019  . Constipation   . Cough    chronic cough  . Demand ischemia ([place]) 06/18/2020  . Dyspnea    "all the time" per patient at PAT 12/30/19  RA sats 87%, no oxygen use  . Encounter for immunization 07/08/2017  . ESRD on hemodialysis ([place]) 02/16/2018   Dialysis  T Thus Sat  - [facility]   . Fall 06/04/2019  . Fall at home, initial encounter  06/04/2019  . GERD (gastroesophageal reflux disease) 06/04/2019  . GI bleeding 05/24/2017  . Gram-negative sepsis, unspecified ([place]) 09/05/2017  . History of completed stroke 2018  . History of fusion of cervical spine 03/26/2020  . Hyperlipidemia   . Hypertension   . Hypertensive urgency 05/24/2017  . Hypokalemia 06/04/2017  . Iron deficiency anemia, unspecified 09/09/2017  . Left shoulder pain 11/11/2019  . Lumbar radiculopathy 11/24/2019  . Lung contusion 06/04/2019  . LVH (left ventricular hypertrophy) due to hypertensive disease, with heart failure ([place]) 06/18/2020  . Macrocytic anemia 05/24/2017  . Moderate protein-calorie malnutrition ([place]) 06/06/2017  . Myofascial pain syndrome 06/12/2020  . Neuritis of right ulnar nerve 09/13/2018  . Non-compliance with renal dialysis ([place]) 02/08/2020  . Other hyperlipidemia 06/04/2017  . Oxygen deficiency 12/30/2019   O2 sats on RA 87% at PAT appt   . Pain in joint of right elbow 09/13/2018  . Renovascular hypertension 06/22/2020  . Respiratory failure, acute ([place]) 10/23/2017  . Restless leg syndrome   . Rib fractures 06/2019   Right  . Secondary hyperparathyroidism of renal origin ([place]) 06/04/2017  . Symptomatic anemia 05/24/2017  . Thoracic ascending aortic aneurysm (HCC)    4.5 cm 06/04/19 CT  . Ulnar neuropathy 10/05/2018  . Volume overload 10/23/2017    Patient Active Problem List   Diagnosis Date Noted  . Hyperkalemia 08/05/2020  . Arthritis   . Asthma   . Cataract   . Cerebral  infarction due to thrombosis of cerebral artery ([place])   . History of CVA (cerebrovascular accident)   . Chronic kidney disease   . Dyspnea   . Hyperlipidemia   . Hypertension   . Kidney failure   . Restless leg syndrome   . Degenerative lumbar spinal stenosis 06/26/2020  . Diastolic dysfunction 93/26/7124  . Renovascular hypertension 06/22/2020  . Demand ischemia ([place]) 06/18/2020  . LVH (left ventricular hypertrophy) due to hypertensive disease, with heart failure  ([place]) 06/18/2020  . Myofascial pain syndrome 06/12/2020  . Encounter for removal of sutures 03/29/2020  . History of fusion of cervical spine 03/26/2020  . Non-compliance with renal dialysis ([place]) 02/08/2020  . Acute pulmonary edema ([place]) 01/23/2020  . Cervical disc herniation 01/04/2020  . Tachycardia 01/04/2020  . SOB (shortness of breath)   . Oxygen deficiency 12/30/2019  . Chronic low back pain 11/24/2019  . Degeneration of lumbar intervertebral disc 11/24/2019  . Lumbar radiculopathy 11/24/2019  . Left shoulder pain 11/11/2019  . Anaphylactic shock, unspecified, sequela 06/10/2019  . Rib fractures 06/05/2019  . Fall at home, initial encounter 06/04/2019  . Right rib fracture 06/04/2019  . Lung contusion 06/04/2019  . Acute respiratory failure with hypoxia ([place]) 06/04/2019  . Anemia in ESRD (end-stage renal disease) ([place]) 06/04/2019  . GERD (gastroesophageal reflux disease) 06/04/2019  . Chronic diastolic (congestive) heart failure ([place]) 06/04/2019  . Fall 06/04/2019  . Thoracic ascending aortic aneurysm ([place]) 06/04/2019  . Acute exacerbation of congestive heart failure ([place]) 05/16/2019  . Carpal tunnel syndrome of right wrist 10/05/2018  . Ulnar neuropathy 10/05/2018  . Neuritis of right ulnar nerve 09/13/2018  . Pain in joint of right elbow 09/13/2018  . Chest pain 04/13/2018  . Chronic diastolic heart failure ([place]) 04/13/2018  . Acute on chronic diastolic CHF (congestive heart failure) ([place]) 04/13/2018  . Chest discomfort   . Atypical chest pain 02/16/2018  . ESRD on dialysis ([place]) 02/16/2018  . ESRD (end stage renal disease) on dialysis ([place]) 02/16/2018  . Volume overload 10/23/2017  . CAP (community acquired pneumonia) 10/23/2017  . Asthma, chronic, unspecified asthma severity, with acute exacerbation 10/23/2017  . Respiratory failure, acute ([place]) 10/23/2017  . Iron deficiency anemia, unspecified 09/09/2017  . Gram-negative sepsis, unspecified ([place]) 09/05/2017  .  Cough 08/23/2017  . Encounter for immunization 07/08/2017  . Moderate protein-calorie malnutrition ([place]) 06/06/2017  . Coagulation defect, unspecified ([place]) 06/04/2017  . Hypokalemia 06/04/2017  . Other hyperlipidemia 06/04/2017  . Secondary hyperparathyroidism of renal origin ([place]) 06/04/2017  . Constipation   . Macrocytic anemia 05/24/2017  . Uremia 05/24/2017  . Hypertensive urgency 05/24/2017  . Acute kidney injury superimposed on CKD ([place]) 05/24/2017  . CKD (chronic kidney disease), stage V ([place]) 05/24/2017  . GI bleeding 05/24/2017  . Symptomatic anemia 05/24/2017  . Stroke ([place]) 2018  . AKI (acute kidney injury) ([place]) 04/10/2015  . History of completed stroke   . Essential hypertension 04/09/2015    Past Surgical History:  Procedure Laterality Date  . ANTERIOR CERVICAL DECOMP/DISCECTOMY FUSION N/A 01/04/2020   Procedure: ANTERIOR CERVICAL DECOMPRESSION/DISCECTOMY FUSION CERVICAL FIVE THROUGH SEVEN;  Surgeon: Melina Schools, MD;  Location: [facility];  Service: Orthopedics;  Laterality: N/A;  3 hrs  . AV FISTULA PLACEMENT Left 05/28/2017   Procedure: LEFT ARM ARTERIOVENOUS (AV) FISTULA CREATION;  Surgeon: Conrad Chewsville, MD;  Location: [facility];  Service: Vascular;  Laterality: Left;  . EYE SURGERY Right 06/02/2019   Cataract removed  . FRACTURE SURGERY    .  IR FLUORO GUIDE CV LINE RIGHT  05/25/2017  . IR US GUIDE VASC ACCESS RIGHT  05/25/2017  . REVISON OF ARTERIOVENOUS FISTULA Left 07/18/2019   Procedure: REVISION PLICATION OF RADIOCEPHALIC ARTERIOVENOUS FISTULA LEFT ARM;  Surgeon: Angelia Mould, MD;  Location: [facility];  Service: Vascular;  Laterality: Left;  . RINOPLASTY         Family History  Problem Relation Age of Onset  . Cancer Mother     Social History   Tobacco Use  . Smoking status: Never Smoker  . Smokeless tobacco: Never Used  Vaping Use  . Vaping Use: Never used  Substance Use Topics  . Alcohol use: Not Currently    Alcohol/week: 0.0 standard drinks     Comment: has a drink once in a while- 12/30/19  . Drug use: No    Home Medications Prior to Admission medications   Medication Sig Start Date End Date Taking? Authorizing Provider  albuterol (VENTOLIN HFA) 108 (90 Base) MCG/ACT inhaler Inhale 2 puffs into the lungs every 6 (six) hours as needed for wheezing or shortness of breath.     [provider]  amLODipine (NORVASC) 10 MG tablet Take 10 mg by mouth daily.     [provider]  atorvastatin (LIPITOR) 20 MG tablet Take 20 mg by mouth daily. 04/19/20   [provider]  AURYXIA 1 GM 210 MG(Fe) tablet Take 420 mg by mouth 3 (three) times daily with meals.  03/16/20   [provider]  BREO ELLIPTA 100-25 MCG/INH AEPB Inhale 1 puff into the lungs daily. 06/26/20   [provider]  cholecalciferol (VITAMIN D3) 25 MCG (1000 UNIT) tablet Take 1,000 Units by mouth daily.    [provider]  furosemide (LASIX) 40 MG tablet Take 1 tablet (40 mg total) by mouth See admin instructions. On Sunday, Monday, Wednesday, Thursday and Saturday 08/14/20   Tobb, Kardie, DO  gabapentin (NEURONTIN) 300 MG capsule Take 300 mg by mouth 2 (two) times daily as needed (pain).     [provider]  hydrALAZINE (APRESOLINE) 25 MG tablet Take 50 mg by mouth 3 (three) times daily.    [provider]  metoprolol tartrate (LOPRESSOR) 100 MG tablet Take 100 mg by mouth 2 (two) times daily.     [provider]  multivitamin (RENA-VIT) TABS tablet Take 1 tablet by mouth daily. 01/12/18   [provider]  sevelamer carbonate (RENVELA) 800 MG tablet Take 800 mg by mouth daily. 11/07/19   [provider]  Specialty Vitamins Products (HEALTHY HEART COMPLEX PO) Take 1 tablet by mouth daily.    [provider]    Allergies    Patient has no known allergies.  Review of Systems   Review of Systems  Constitutional: Positive for fatigue. Negative for chills, diaphoresis and fever.   HENT: Negative for congestion.   Eyes: Negative for visual disturbance.  Respiratory: Positive for cough, chest tightness and shortness of breath. Negative for sputum production, wheezing and stridor.   Cardiovascular: Positive for chest pain and leg swelling. Negative for palpitations.  Gastrointestinal: Negative for abdominal pain and vomiting.  Musculoskeletal: Negative for back pain.  Skin: Negative for rash.  Neurological: Positive for light-headedness. Negative for dizziness and headaches.  Psychiatric/Behavioral: Negative for agitation.  All other systems reviewed and are negative.   Physical Exam Updated Vital Signs BP 106/72 (BP Location: Right Arm)   Pulse 92   Temp 98 F (36.7 C) (Oral)   Resp [name])  22   Ht 5\' 8"  (1.727 m)   Wt 65 kg   SpO2 (!) 89%   BMI 21.79 kg/m   Physical Exam Vitals and nursing note reviewed.  Constitutional:      General: He is in acute distress.     Appearance: He is well-developed and well-nourished. He is ill-appearing. He is not toxic-appearing or diaphoretic.  HENT:     Head: Normocephalic and atraumatic.  Eyes:     Conjunctiva/sclera: Conjunctivae normal.  Cardiovascular:     Rate and Rhythm: Normal rate and regular rhythm.  No extrasystoles are present.    Heart sounds: No murmur heard.   Pulmonary:     Effort: Pulmonary effort is normal. Tachypnea present. No respiratory distress.     Breath sounds: Rhonchi and rales present.  Chest:     Chest wall: No tenderness.  Abdominal:     Palpations: Abdomen is soft.     Tenderness: There is no abdominal tenderness.  Musculoskeletal:     Cervical back: Neck supple.     Right lower leg: No tenderness. Edema present.     Left lower leg: No tenderness. Edema present.  Skin:    General: Skin is warm and dry.     Capillary Refill: Capillary refill takes less than 2 seconds.     Findings: No erythema.  Neurological:     General: No focal deficit present.     Mental Status: He is  alert.  Psychiatric:        Mood and Affect: Mood and affect and mood normal.     ED Results / Procedures / Treatments   Labs (all labs ordered are listed, but only abnormal results are displayed) Labs Reviewed  CBC WITH DIFFERENTIAL/PLATELET - Abnormal; Notable for the following components:      Result Value   RBC 2.14 (*)    Hemoglobin 7.2 (*)    HCT 22.1 (*)    MCV 103.3 (*)    RDW 16.8 (*)    nRBC 0.3 (*)    Lymphs Abs 0.3 (*)    All other components within normal limits  COMPREHENSIVE METABOLIC PANEL - Abnormal; Notable for the following components:   Potassium 6.1 (*)    CO2 17 (*)    Glucose, Bld 100 (*)    BUN 136 (*)    Creatinine, Ser 12.08 (*)    AST 10 (*)    GFR, Estimated 4 (*)    Anion gap 22 (*)    All other components within normal limits  I-STAT CHEM 8, ED - Abnormal; Notable for the following components:   Potassium 6.0 (*)    BUN >130 (*)    Creatinine, Ser 11.90 (*)    TCO2 20 (*)    Hemoglobin 8.8 (*)    HCT 26.0 (*)    All other components within normal limits  TROPONIN I (HIGH SENSITIVITY) - Abnormal; Notable for the following components:   Troponin I (High Sensitivity) 199 (*)    All other components within normal limits  SARS CORONAVIRUS 2 (TAT 6-24 HRS)  LACTIC ACID, PLASMA  LACTIC ACID, PLASMA    EKG EKG Interpretation  Date/Time:  Sunday September 09 2020 19:38:48 EST Ventricular Rate:  78 PR Interval:    QRS Duration: 92 QT Interval:  397 QTC Calculation: 453 R Axis:   91 Text Interpretation: Sinus rhythm Consider left atrial enlargement Right axis deviation Consider left ventricular hypertrophy When cmpard to prior, similarly sharp t waves. No STEMI  Confirmed by Antony Blackbird [phone]) on 09/09/2020 7:41:30 PM   Radiology DG Chest Portable 1 View  Result Date: 09/09/2020 CLINICAL DATA:  Hypoxia and shortness of breath. Missed dialysis last week. EXAM: PORTABLE CHEST 1 VIEW COMPARISON:  Most recent radiograph 09/04/2020.   Additional priors. FINDINGS: Similar cardiomegaly. Unchanged mediastinal contours. Aortic atherosclerosis. Hazy lung base opacities blunting of the costophrenic angles likely small effusions. Peribronchial thickening with features of interstitial edema, similar to prior. No pneumothorax. No acute osseous abnormalities are seen. IMPRESSION: Unchanged cardiomegaly. Unchanged pulmonary edema. Probable small bilateral pleural effusions. Findings consistent with CHF. Electronically Signed   By: Keith Rake M.D.   On: 09/09/2020 20:10    Procedures Procedures (including critical care time)  CRITICAL CARE Performed by: Gwenyth Allegra Hiilei Gerst Total critical care time: 35 minutes Critical care time was exclusive of separately billable procedures and treating other patients. Critical care was necessary to treat or prevent imminent or life-threatening deterioration. Critical care was time spent personally by me on the following activities: development of treatment plan with patient and/or surrogate as well as nursing, discussions with consultants, evaluation of patient's response to treatment, examination of patient, obtaining history from patient or surrogate, ordering and performing treatments and interventions, ordering and review of laboratory studies, ordering and review of radiographic studies, pulse oximetry and re-evaluation of patient's condition.   Medications Ordered in ED Medications  sodium bicarbonate injection 50 mEq (50 mEq Intravenous Given 09/09/20 2058)    ED Course  I have reviewed the triage vital signs and the nursing notes.  Pertinent labs & imaging results that were available during my care of the patient were reviewed by me and considered in my medical decision making (see chart for details).    MDM Rules/Calculators/A&P                          Coleman Kalas is a 60 y.o. male with a past medical history significant for hypertension, ESRD, CHF, GERD, hyperlipidemia,  prior GI bleeding, and prior stroke who presents in respiratory distress.  According to EMS and patient, he has missed dialysis for the last week.  He says that over the last 24 hours he has had worsening shortness of breath.  He does report a dry cough but has not had any fevers or chills.  He is not vaccinated he reports.  He says that he has had intermittent chest pains that is mild but he is more concerned by his shortness of breath.  He reports he had worsened edema in his bilateral lower extremities and his left arm.  This is how he feels when he is fluid overloaded needing emergent dialysis.  Fire department reportedly found his oxygen saturations around 80%.  He arrives on CPAP maintaining his oxygen saturations around 90%.  On exam, lungs have rales in all fields with some mild rhonchi.  Chest abdomen nontender.  Pulses are present in extremities.  Patient has pitting edema in both of his lower extremities and his left arm.  He is afebrile on arrival.  Nephrology was called who will come see patient.  I do suspect he will need emergent dialysis.  We will get screening labs as well as a COVID test as he is on BiPAP.  Anticipate follow-up on nephrology recommendations and further recommendations.  8:39 PM Nephrology is coming to the patient.  As he is on BiPAP, they recommend critical care evaluation for admission.  COVID test is still in  process  9:05 PM Critical care says that they feel although he is on BiPAP, the hospitalist medicine team will be able to admit this patient.  They will see and write a consult note but they do not feel he needs ICU care.  He is awaiting nephrology evaluation and recommendations and dialysis   Final Clinical Impression(s) / ED Diagnoses Final diagnoses:  Hypoxia  SOB (shortness of breath)  Hypervolemia, unspecified hypervolemia type     Clinical Impression: 1. Hypoxia   2. SOB (shortness of breath)   3. Hypervolemia, unspecified hypervolemia type      Disposition: Admit  This note was prepared with assistance of Dragon voice recognition software. Occasional wrong-word or sound-a-like substitutions may have occurred due to the inherent limitations of voice recognition software.     Ismael Karge, Gwenyth Allegra, MD 09/09/20 2114

## 2020-09-09 NOTE — H&P (Incomplete)
History and Physical    Thomas Robinson UYQ:[id number] DOB: Feb 08, 1961 DOA: 09/09/2020  PCP: [name], PA  Patient coming from: Home.  Chief Complaint: Shortness of breath.  HPI: Thomas Robinson is a 60 y.o. male with history of ESRD on hemodialysis who has been discharged from local clinics has had last dialysis on September 04, 2020 presents to the ER because of worsening shortness of breath over the last 24 hours.  Has been having some pleuritic type of chest pain.  Has peripheral edema at least 3+ both bilateral lower extremities and hands.  And states he has been compliant with his blood pressure medicine.  ED Course: In the ER patient was hypoxic with sats in the 70s requiring BiPAP.  Chest x-ray shows pulmonary edema.  EKG shows tall T waves.  Potassium was around 6.1.  Covid test is pending.  Hemoglobin is at baseline.  Initially pulmonary critical care was consulted but stated that he was stable enough to be managed by hospitalist.  Nephrology has been consulted and urgent dialysis has been arranged.  Patient also was given calcium gluconate IV.  Review of Systems: As per HPI, rest all negative.   Past Medical History:  Diagnosis Date  . Acute on chronic diastolic CHF (congestive heart failure) ([place]) 04/13/2018  . Acute pulmonary edema ([place]) 01/23/2020  . Acute respiratory failure with hypoxia ([place]) 06/04/2019  . Anaphylactic shock, unspecified, sequela 06/10/2019  . Arthritis   . Asthma, chronic, unspecified asthma severity, with acute exacerbation 10/23/2017  . Atypical chest pain 02/16/2018  . CAP (community acquired pneumonia) 10/23/2017  . Carpal tunnel syndrome of right wrist 10/05/2018  . Cataract    right - removed by surgery  . Cerebral infarction due to thrombosis of cerebral artery ([place])   . Cervical disc herniation 01/04/2020  . Chest pain 04/13/2018  . Chronic diastolic heart failure ([place]) 04/13/2018  . Chronic low back pain 11/24/2019  . Constipation   . Cough     chronic cough  . Demand ischemia ([place]) 06/18/2020  . Dyspnea    "all the time" per patient at PAT 12/30/19  RA sats 87%, no oxygen use  . Encounter for immunization 07/08/2017  . ESRD on hemodialysis ([place]) 02/16/2018   Dialysis  T Thus Sat  - [facility]   . Fall 06/04/2019  . Fall at home, initial encounter 06/04/2019  . GERD (gastroesophageal reflux disease) 06/04/2019  . GI bleeding 05/24/2017  . Gram-negative sepsis, unspecified ([place]) 09/05/2017  . History of completed stroke 2018  . History of fusion of cervical spine 03/26/2020  . Hyperlipidemia   . Hypertension   . Hypertensive urgency 05/24/2017  . Hypokalemia 06/04/2017  . Iron deficiency anemia, unspecified 09/09/2017  . Left shoulder pain 11/11/2019  . Lumbar radiculopathy 11/24/2019  . Lung contusion 06/04/2019  . LVH (left ventricular hypertrophy) due to hypertensive disease, with heart failure ([place]) 06/18/2020  . Macrocytic anemia 05/24/2017  . Moderate protein-calorie malnutrition ([place]) 06/06/2017  . Myofascial pain syndrome 06/12/2020  . Neuritis of right ulnar nerve 09/13/2018  . Non-compliance with renal dialysis ([place]) 02/08/2020  . Other hyperlipidemia 06/04/2017  . Oxygen deficiency 12/30/2019   O2 sats on RA 87% at PAT appt   . Pain in joint of right elbow 09/13/2018  . Renovascular hypertension 06/22/2020  . Respiratory failure, acute ([place]) 10/23/2017  . Restless leg syndrome   . Rib fractures 06/2019   Right  . Secondary hyperparathyroidism of renal origin ([place]) 06/04/2017  .  Symptomatic anemia 05/24/2017  . Thoracic ascending aortic aneurysm (HCC)    4.5 cm 06/04/19 CT  . Ulnar neuropathy 10/05/2018  . Volume overload 10/23/2017    Past Surgical History:  Procedure Laterality Date  . ANTERIOR CERVICAL DECOMP/DISCECTOMY FUSION N/A 01/04/2020   Procedure: ANTERIOR CERVICAL DECOMPRESSION/DISCECTOMY FUSION CERVICAL FIVE THROUGH SEVEN;  Surgeon: Melina Schools, MD;  Location: [place];  Service: Orthopedics;  Laterality:  N/A;  3 hrs  . AV FISTULA PLACEMENT Left 05/28/2017   Procedure: LEFT ARM ARTERIOVENOUS (AV) FISTULA CREATION;  Surgeon: Conrad Collins, MD;  Location: [place];  Service: Vascular;  Laterality: Left;  . EYE SURGERY Right 06/02/2019   Cataract removed  . FRACTURE SURGERY    . IR FLUORO GUIDE CV LINE RIGHT  05/25/2017  . IR US GUIDE VASC ACCESS RIGHT  05/25/2017  . REVISON OF ARTERIOVENOUS FISTULA Left 07/18/2019   Procedure: REVISION PLICATION OF RADIOCEPHALIC ARTERIOVENOUS FISTULA LEFT ARM;  Surgeon: Angelia Mould, MD;  Location: [place];  Service: Vascular;  Laterality: Left;  . RINOPLASTY       reports that he has never smoked. He has never used smokeless tobacco. He reports previous alcohol use. He reports that he does not use drugs.  No Known Allergies  Family History  Problem Relation Age of Onset  . Cancer Mother     Prior to Admission medications   Medication Sig Start Date End Date Taking? Authorizing Provider  albuterol (VENTOLIN HFA) 108 (90 Base) MCG/ACT inhaler Inhale 2 puffs into the lungs every 6 (six) hours as needed for wheezing or shortness of breath.     [provider]  amLODipine (NORVASC) 10 MG tablet Take 10 mg by mouth daily.     [provider]  atorvastatin (LIPITOR) 20 MG tablet Take 20 mg by mouth daily. 04/19/20   [provider]  AURYXIA 1 GM 210 MG(Fe) tablet Take 420 mg by mouth 3 (three) times daily with meals.  03/16/20   [provider]  BREO ELLIPTA 100-25 MCG/INH AEPB Inhale 1 puff into the lungs daily. 06/26/20   [provider]  cholecalciferol (VITAMIN D3) 25 MCG (1000 UNIT) tablet Take 1,000 Units by mouth daily.    [provider]  furosemide (LASIX) 40 MG tablet Take 1 tablet (40 mg total) by mouth See admin instructions. On Sunday, Monday, Wednesday, Thursday and Saturday 08/14/20   Tobb, Kardie, DO  gabapentin (NEURONTIN) 300 MG capsule Take 300 mg by mouth 2 (two) times daily as needed  (pain).     [provider]  hydrALAZINE (APRESOLINE) 25 MG tablet Take 50 mg by mouth 3 (three) times daily.    [provider]  metoprolol tartrate (LOPRESSOR) 100 MG tablet Take 100 mg by mouth 2 (two) times daily.     [provider]  multivitamin (RENA-VIT) TABS tablet Take 1 tablet by mouth daily. 01/12/18   [provider]  sevelamer carbonate (RENVELA) 800 MG tablet Take 800 mg by mouth daily. 11/07/19   [provider]  Specialty Vitamins Products (HEALTHY HEART COMPLEX PO) Take 1 tablet by mouth daily.    [provider]    Physical Exam: Constitutional: Moderately built and nourished. Vitals:   09/09/20 2215 09/09/20 2230 09/09/20 2245 09/09/20 2306  BP: (!) 194/91 (!) 182/88 (!) 183/90   Pulse: 81 79 81   Resp: 14 18 17    Temp:      TempSrc:      SpO2: 100% 100% 100% 95%  Weight:      Height:       Eyes: Anicteric no pallor. ENMT: No discharge from the ears eyes nose or mouth. Neck: No mass felt.  No neck rigidity. Respiratory: No rhonchi or crepitations. Cardiovascular: S1-S2 heard. Abdomen: Soft nontender bowel sounds present. Musculoskeletal: Bilateral lower extremity edema and also edema in the upper extremities. Skin: Chronic skin changes. Neurologic: Alert awake oriented to time place and person.  Moves all extremities. Psychiatric: Appears normal.  Normal affect.   Labs on Admission: I have personally reviewed following labs and imaging studies  CBC: Recent Labs  Lab 09/04/20 0450 09/09/20 2005 09/09/20 2018  WBC 6.2 5.8  --   NEUTROABS 5.2 4.9  --   HGB 7.7* 7.2* 8.8*  HCT 24.4* 22.1* 26.0*  MCV 100.4* 103.3*  --   PLT 123* 155  --    Basic Metabolic Panel: Recent Labs  Lab 09/04/20 0450 09/09/20 2005 09/09/20 2018  NA 140 141 141  K 6.3* 6.1* 6.0*  CL 102 102 108  CO2 20* 17*  --   GLUCOSE 93 100* 89  BUN 111* 136* >130*  CREATININE 10.55* 12.08* 11.90*  CALCIUM 9.5 9.9  --     GFR: Estimated Creatinine Clearance: 6 mL/min (A) (by C-G formula based on SCr of 11.9 mg/dL (H)). Liver Function Tests: Recent Labs  Lab 09/09/20 2005  AST 10*  ALT 17  ALKPHOS 69  BILITOT 1.0  PROT 6.7  ALBUMIN 3.6   No results for input(s): LIPASE, AMYLASE in the last 168 hours. No results for input(s): AMMONIA in the last 168 hours. Coagulation Profile: No results for input(s): INR, PROTIME in the last 168 hours. Cardiac Enzymes: No results for input(s): CKTOTAL, CKMB, CKMBINDEX, TROPONINI in the last 168 hours. BNP (last 3 results) No results for input(s): PROBNP in the last 8760 hours. HbA1C: No results for input(s): HGBA1C in the last 72 hours. CBG: No results for input(s): GLUCAP in the last 168 hours. Lipid Profile: No results for input(s): CHOL, HDL, LDLCALC, TRIG, CHOLHDL, LDLDIRECT in the last 72 hours. Thyroid Function Tests: No results for input(s): TSH, T4TOTAL, FREET4, T3FREE, THYROIDAB in the last 72 hours. Anemia Panel: No results for input(s): VITAMINB12, FOLATE, FERRITIN, TIBC, IRON, RETICCTPCT in the last 72 hours. Urine analysis:    Component Value Date/Time   COLORURINE YELLOW 05/24/2017 1729   APPEARANCEUR HAZY (A) 05/24/2017 1729   LABSPEC 1.011 05/24/2017 1729   PHURINE 5.0 05/24/2017 1729   GLUCOSEU 50 (A) 05/24/2017 1729   HGBUR SMALL (A) 05/24/2017 1729   BILIRUBINUR NEGATIVE 05/24/2017 1729   KETONESUR NEGATIVE 05/24/2017 1729   PROTEINUR 100 (A) 05/24/2017 1729   UROBILINOGEN 0.2 04/09/2015 1527   NITRITE NEGATIVE 05/24/2017 1729   LEUKOCYTESUR TRACE (A) 05/24/2017 1729   Sepsis Labs: @LABRCNTIP (procalcitonin:4,lacticidven:4) ) Recent Results (from the past 240 hour(s))  Resp Panel by RT-PCR (Flu A&B, Covid) Nasopharyngeal Swab     Status: None   Collection Time: 09/04/20  3:49 AM   Specimen: Nasopharyngeal Swab; Nasopharyngeal(NP) swabs in vial transport medium  Result Value Ref Range Status   SARS Coronavirus 2 by RT PCR  NEGATIVE NEGATIVE Final    Comment: (NOTE) SARS-CoV-2 target nucleic acids are NOT DETECTED.  The SARS-CoV-2 RNA is generally detectable in upper respiratory specimens during the acute phase of infection. The lowest concentration of SARS-CoV-2 viral copies this assay can detect is 138 copies/mL. A negative result does not preclude SARS-Cov-2 infection and should not be used as  the sole basis for treatment or other patient management decisions. A negative result may occur with  improper specimen collection/handling, submission of specimen other than nasopharyngeal swab, presence of viral mutation(s) within the areas targeted by this assay, and inadequate number of viral copies(<138 copies/mL). A negative result must be combined with clinical observations, patient history, and epidemiological information. The expected result is Negative.  Fact Sheet for Patients:  EntrepreneurPulse.com.au  Fact Sheet for Healthcare Providers:  IncredibleEmployment.be  This test is no t yet approved or cleared by the [place] FDA and  has been authorized for detection and/or diagnosis of SARS-CoV-2 by FDA under an Emergency Use Authorization (EUA). This EUA will remain  in effect (meaning this test can be used) for the duration of the COVID-19 declaration under Section 564(b)(1) of the Act, 21 U.S.C.section 360bbb-3(b)(1), unless the authorization is terminated  or revoked sooner.       Influenza A by PCR NEGATIVE NEGATIVE Final   Influenza B by PCR NEGATIVE NEGATIVE Final    Comment: (NOTE) The Xpert Xpress SARS-CoV-2/FLU/RSV plus assay is intended as an aid in the diagnosis of influenza from Nasopharyngeal swab specimens and should not be used as a sole basis for treatment. Nasal washings and aspirates are unacceptable for Xpert Xpress SARS-CoV-2/FLU/RSV testing.  Fact Sheet for Patients: EntrepreneurPulse.com.au  Fact Sheet for  Healthcare Providers: IncredibleEmployment.be  This test is not yet approved or cleared by the [place] FDA and has been authorized for detection and/or diagnosis of SARS-CoV-2 by FDA under an Emergency Use Authorization (EUA). This EUA will remain in effect (meaning this test can be used) for the duration of the COVID-19 declaration under Section 564(b)(1) of the Act, 21 U.S.C. section 360bbb-3(b)(1), unless the authorization is terminated or revoked.  Performed at [facility], [place] [address] [place], [place] [address]      Radiological Exams on Admission: DG Chest Portable 1 View  Result Date: 09/09/2020 CLINICAL DATA:  Hypoxia and shortness of breath. Missed dialysis last week. EXAM: PORTABLE CHEST 1 VIEW COMPARISON:  Most recent radiograph 09/04/2020.  Additional priors. FINDINGS: Similar cardiomegaly. Unchanged mediastinal contours. Aortic atherosclerosis. Hazy lung base opacities blunting of the costophrenic angles likely small effusions. Peribronchial thickening with features of interstitial edema, similar to prior. No pneumothorax. No acute osseous abnormalities are seen. IMPRESSION: Unchanged cardiomegaly. Unchanged pulmonary edema. Probable small bilateral pleural effusions. Findings consistent with CHF. Electronically Signed   By: Keith Rake M.D.   On: 09/09/2020 20:10    EKG: Independently reviewed.  Normal sinus rhythm with tall T waves in the lateral leads.  Assessment/Plan Principal Problem:   Acute pulmonary edema (HCC) Active Problems:   Hypertensive urgency   ESRD on dialysis ([place])   Anemia in ESRD (end-stage renal disease) ([place])    1. Acute pulmonary edema secondary to missing dialysis for which nephrology has been consulted.  Patient is presently on BiPAP.  Appreciate nephrology consult.  Urgent dialysis has been arranged.  Follow metabolic panel and respiratory status after dialysis. 2. Hypertensive urgency likely from fluid  overload.  I think blood pressure will improve with dialysis.  For now we will keep patient on as needed IV hydralazine continue home dose of hydralazine amlodipine and metoprolol. 3. Hyperkalemia secondary to missing dialysis I think will improve with dialysis.  1 dose of calcium gluconate, [place] and sodium bicarbonate was given. 4. Elevated troponins likely from fluid overload.  Patient does complain of some pleuritic type of chest pain will trend cardiac  markers.  Will consult cardiology.  Patient is on metoprolol and statin.  Will add baby aspirin. 5. Anemia likely from ESRD hemoglobin appears to be stable.  Covid test is pending.   DVT prophylaxis: Heparin. Code Status: Full code. Family Communication: Discussed with patient. Disposition Plan: Home. Consults called: Nephrology critical care and cardiology. Admission status: Observation.   Rise Patience MD [facility] Pager [phone].  If 7PM-7AM, please contact night-coverage [email or web address] Password [email or web address]  09/09/2020, 11:18 PM

## 2020-09-09 NOTE — ED Notes (Signed)
Trop 199, notified provider

## 2020-09-09 NOTE — Consult Note (Signed)
NAME:  Thomas Robinson, MRN:  [id number], DOB:  25-Aug-1961, LOS: 0 ADMISSION DATE:  09/09/2020, CONSULTATION DATE: 09/09/2020 REFERRING MD: Dr. Sherry Ruffing from ED, CHIEF COMPLAINT: Shortness of breath and chest tightness  Brief History:  60 year old male with hypertension, end-stage renal disease who presented with shortness of breath and chest tightness after missing his regular dialysis session.  History of Present Illness:  60 year old with end-stage renal disease presented with worsening shortness of breath, cough and chest tightness starting 2 days ago.  As per patient he was in usual state of health when he was discharged from this hospital on 1/4 after receiving hemodialysis, since then he has not received his hemodialysis session because he was in fight with his prior dialysis center, and was denied access to it and since then he has not set up his regular dialysis center.  Since last night he started with difficulty breathing initially with exertion now all the time associated chest tightness and dry cough.  In the ED patient was noted to be hypoxic into high 70s, was placed on BiPAP.  Also noted to have serum potassium of 6.  Patient denies fever, chills, abdominal pain, nausea, vomiting, headache, weakness or other complaints  Past Medical History:  Hypertension End-stage renal disease Hyperlipidemia Prior history of GI bleeding HFpEF History of CVA GERD  Significant Hospital Events:    Consults:  Nephrology PCCM  Procedures:  N/A  Significant Diagnostic Tests:  1/9 x-ray chest: Bilateral pulmonary edema  Micro Data:  1/9 Covid PCR >  Antimicrobials:    Interim History / Subjective:  Placed on BiPAP with improvement in O2 sat  Objective   Blood pressure (!) 194/94, pulse 81, temperature 98 F (36.7 C), temperature source Oral, resp. rate 19, height 5\' 8"  (1.727 m), weight 65 kg, SpO2 100 %.    Vent Mode: BIPAP;PCV FiO2 (%):  [40 %] 40 % Set Rate:  [14 bmp] 14  bmp PEEP:  [6 cmH20] 6 cmH20  No intake or output data in the 24 hours ending 09/09/20 2126 Filed Weights   09/09/20 1939  Weight: 65 kg    Examination: Physical exam: General: Chronically ill-appearing male, on BiPAP, lying on the bed HEENT: [place]/AT, eyes anicteric.  Dry mucous membranes Neuro: Alert, awake, following commands.  Antigravity on all 4 extremities Chest: Bilateral crackles at bases, no wheezes or rhonchi Heart: Tachycardic, regular rhythm no murmurs or gallops Abdomen: Soft, nontender, nondistended, bowel sounds present Skin: No rash   Resolved Hospital Problem list     Assessment & Plan:  Acute hypoxic respiratory failure due to fluid overload from missed dialysis session End-stage renal disease noncompliant with his regular HD sessions Hyperkalemia Anion gap metabolic acidosis Demand cardiac ischemia Anemia of chronic disease due to end-stage renal disease Chronic diastolic congestive heart failure  Continue BiPAP for now to maintain O2 sat 90-95% Nephrology has been consulted, they ordered for urgent dialysis considering his serum potassium is elevated to 6.1 He received calcium gluconate and 1 amp of bicarbonate Continue [place] Patient needs social worker help to set up new dialysis center to prevent him from coming to ED frequently for missed hemodialysis session Monitor serum potassium His serum troponin is elevated, likely due to demand cardiac ischemia from fluid overload Trend troponin and EKGs Monitor H&H  Patient will need urgent dialysis and will require admission to medicine until his dialysis center is established.[name]  PCCM will sign off please call with question Best practice (evaluated daily)  Diet: N.p.o. for  now Pain/Anxiety/Delirium protocol (if indicated): N/A VAP protocol (if indicated): N/A DVT prophylaxis: Per primary team GI prophylaxis: N/A Glucose control: N/A Mobility: Bedrest for now Disposition: Per primary team  Goals of  Care:  Last date of multidisciplinary goals of care discussion: 1/9 Family and staff present: Patient's RN Summary of discussion: Continue full aggressive care Follow up goals of care discussion due: 1/16 Code Status: Full  Labs   CBC: Recent Labs  Lab 09/04/20 0450 09/09/20 2005 09/09/20 2018  WBC 6.2 5.8  --   NEUTROABS 5.2 4.9  --   HGB 7.7* 7.2* 8.8*  HCT 24.4* 22.1* 26.0*  MCV 100.4* 103.3*  --   PLT 123* 155  --     Basic Metabolic Panel: Recent Labs  Lab 09/04/20 0450 09/09/20 2005 09/09/20 2018  NA 140 141 141  K 6.3* 6.1* 6.0*  CL 102 102 108  CO2 20* 17*  --   GLUCOSE 93 100* 89  BUN 111* 136* >130*  CREATININE 10.55* 12.08* 11.90*  CALCIUM 9.5 9.9  --    GFR: Estimated Creatinine Clearance: 6 mL/min (A) (by C-G formula based on SCr of 11.9 mg/dL (H)). Recent Labs  Lab 09/04/20 0450 09/09/20 2005  WBC 6.2 5.8  LATICACIDVEN  --  1.3    Liver Function Tests: Recent Labs  Lab 09/09/20 2005  AST 10*  ALT 17  ALKPHOS 69  BILITOT 1.0  PROT 6.7  ALBUMIN 3.6   No results for input(s): LIPASE, AMYLASE in the last 168 hours. No results for input(s): AMMONIA in the last 168 hours.  ABG    Component Value Date/Time   HCO3 6.3 (L) 05/24/2017 1945   TCO2 20 (L) 09/09/2020 2018   ACIDBASEDEF 22.0 (H) 05/24/2017 1945   O2SAT 68.0 05/24/2017 1945     Coagulation Profile: No results for input(s): INR, PROTIME in the last 168 hours.  Cardiac Enzymes: No results for input(s): CKTOTAL, CKMB, CKMBINDEX, TROPONINI in the last 168 hours.  HbA1C: Hgb A1c MFr Bld  Date/Time Value Ref Range Status  01/04/2020 05:49 PM 4.9 4.8 - 5.6 % Final    Comment:    (NOTE) Pre diabetes:          5.7%-6.4% Diabetes:              >6.4% Glycemic control for   <7.0% adults with diabetes   02/17/2018 03:45 AM 5.1 4.8 - 5.6 % Final    Comment:    (NOTE) Pre diabetes:          5.7%-6.4% Diabetes:              >6.4% Glycemic control for   <7.0% adults with  diabetes     CBG: No results for input(s): GLUCAP in the last 168 hours.  Review of Systems:   12 point review of systems significant for complaint mentioned in HPI, rest is negative  Past Medical History:  He,  has a past medical history of Acute on chronic diastolic CHF (congestive heart failure) ([place]) (04/13/2018), Acute pulmonary edema ([place]) (01/23/2020), Acute respiratory failure with hypoxia ([place]) (06/04/2019), Anaphylactic shock, unspecified, sequela (06/10/2019), Arthritis, Asthma, chronic, unspecified asthma severity, with acute exacerbation (10/23/2017), Atypical chest pain (02/16/2018), CAP (community acquired pneumonia) (10/23/2017), Carpal tunnel syndrome of right wrist (10/05/2018), Cataract, Cerebral infarction due to thrombosis of cerebral artery ([place]), Cervical disc herniation (01/04/2020), Chest pain (04/13/2018), Chronic diastolic heart failure ([place]) (04/13/2018), Chronic low back pain (11/24/2019), Constipation, Cough, Demand ischemia ([place]) (06/18/2020), Dyspnea, Encounter for  immunization (07/08/2017), ESRD on hemodialysis ([place]) (02/16/2018), Fall (06/04/2019), Fall at home, initial encounter (06/04/2019), GERD (gastroesophageal reflux disease) (06/04/2019), GI bleeding (05/24/2017), Gram-negative sepsis, unspecified ([place]) (09/05/2017), History of completed stroke (2018), History of fusion of cervical spine (03/26/2020), Hyperlipidemia, Hypertension, Hypertensive urgency (05/24/2017), Hypokalemia (06/04/2017), Iron deficiency anemia, unspecified (09/09/2017), Left shoulder pain (11/11/2019), Lumbar radiculopathy (11/24/2019), Lung contusion (06/04/2019), LVH (left ventricular hypertrophy) due to hypertensive disease, with heart failure ([place]) (06/18/2020), Macrocytic anemia (05/24/2017), Moderate protein-calorie malnutrition ([place]) (06/06/2017), Myofascial pain syndrome (06/12/2020), Neuritis of right ulnar nerve (09/13/2018), Non-compliance with renal dialysis ([place]) (02/08/2020), Other hyperlipidemia (06/04/2017),  Oxygen deficiency (12/30/2019), Pain in joint of right elbow (09/13/2018), Renovascular hypertension (06/22/2020), Respiratory failure, acute ([place]) (10/23/2017), Restless leg syndrome, Rib fractures (06/2019), Secondary hyperparathyroidism of renal origin ([place]) (06/04/2017), Symptomatic anemia (05/24/2017), Thoracic ascending aortic aneurysm (HCC), Ulnar neuropathy (10/05/2018), and Volume overload (10/23/2017).   Surgical History:   Past Surgical History:  Procedure Laterality Date  . ANTERIOR CERVICAL DECOMP/DISCECTOMY FUSION N/A 01/04/2020   Procedure: ANTERIOR CERVICAL DECOMPRESSION/DISCECTOMY FUSION CERVICAL FIVE THROUGH SEVEN;  Surgeon: Melina Schools, MD;  Location: [facility];  Service: Orthopedics;  Laterality: N/A;  3 hrs  . AV FISTULA PLACEMENT Left 05/28/2017   Procedure: LEFT ARM ARTERIOVENOUS (AV) FISTULA CREATION;  Surgeon: Conrad [place], MD;  Location: [facility];  Service: Vascular;  Laterality: Left;  . EYE SURGERY Right 06/02/2019   Cataract removed  . FRACTURE SURGERY    . IR FLUORO GUIDE CV LINE RIGHT  05/25/2017  . IR US GUIDE VASC ACCESS RIGHT  05/25/2017  . REVISON OF ARTERIOVENOUS FISTULA Left 07/18/2019   Procedure: REVISION PLICATION OF RADIOCEPHALIC ARTERIOVENOUS FISTULA LEFT ARM;  Surgeon: Angelia Mould, MD;  Location: [facility];  Service: Vascular;  Laterality: Left;  . RINOPLASTY       Social History:   reports that he has never smoked. He has never used smokeless tobacco. He reports previous alcohol use. He reports that he does not use drugs.   Family History:  His family history includes Cancer in his mother.   Allergies No Known Allergies   Home Medications  Prior to Admission medications   Medication Sig Start Date End Date Taking? Authorizing Provider  albuterol (VENTOLIN HFA) 108 (90 Base) MCG/ACT inhaler Inhale 2 puffs into the lungs every 6 (six) hours as needed for wheezing or shortness of breath.     [provider]  amLODipine (NORVASC) 10 MG tablet  Take 10 mg by mouth daily.     [provider]  atorvastatin (LIPITOR) 20 MG tablet Take 20 mg by mouth daily. 04/19/20   [provider]  AURYXIA 1 GM 210 MG(Fe) tablet Take 420 mg by mouth 3 (three) times daily with meals.  03/16/20   [provider]  BREO ELLIPTA 100-25 MCG/INH AEPB Inhale 1 puff into the lungs daily. 06/26/20   [provider]  cholecalciferol (VITAMIN D3) 25 MCG (1000 UNIT) tablet Take 1,000 Units by mouth daily.    [provider]  furosemide (LASIX) 40 MG tablet Take 1 tablet (40 mg total) by mouth See admin instructions. On Sunday, Monday, Wednesday, Thursday and Saturday 08/14/20   Tobb, Kardie, DO  gabapentin (NEURONTIN) 300 MG capsule Take 300 mg by mouth 2 (two) times daily as needed (pain).     [provider]  hydrALAZINE (APRESOLINE) 25 MG tablet Take 50 mg by mouth 3 (three) times daily.    [provider]  metoprolol tartrate (LOPRESSOR) 100 MG tablet Take  100 mg by mouth 2 (two) times daily.     [provider]  multivitamin (RENA-VIT) TABS tablet Take 1 tablet by mouth daily. 01/12/18   [provider]  sevelamer carbonate (RENVELA) 800 MG tablet Take 800 mg by mouth daily. 11/07/19   [provider]  Specialty Vitamins Products (HEALTHY HEART COMPLEX PO) Take 1 tablet by mouth daily.    [provider]     Total critical care time: 34 minutes  Performed by: [place] care time was exclusive of separately billable procedures and treating other patients.   Critical care was necessary to treat or prevent imminent or life-threatening deterioration.   Critical care was time spent personally by me on the following activities: development of treatment plan with patient and/or surrogate as well as nursing, discussions with consultants, evaluation of patient's response to treatment, examination of patient, obtaining history from patient or surrogate, ordering  and performing treatments and interventions, ordering and review of laboratory studies, ordering and review of radiographic studies, pulse oximetry and re-evaluation of patient's condition.   Jacky Kindle MD [facility] Pager: [phone] Mobile: [phone]

## 2020-09-10 ENCOUNTER — Encounter (HOSPITAL_COMMUNITY): Payer: Self-pay | Admitting: Internal Medicine

## 2020-09-10 DIAGNOSIS — Z20822 Contact with and (suspected) exposure to covid-19: Secondary | ICD-10-CM | POA: Diagnosis present

## 2020-09-10 DIAGNOSIS — E872 Acidosis: Secondary | ICD-10-CM | POA: Diagnosis present

## 2020-09-10 DIAGNOSIS — M1990 Unspecified osteoarthritis, unspecified site: Secondary | ICD-10-CM | POA: Diagnosis present

## 2020-09-10 DIAGNOSIS — Z79899 Other long term (current) drug therapy: Secondary | ICD-10-CM | POA: Diagnosis not present

## 2020-09-10 DIAGNOSIS — N186 End stage renal disease: Secondary | ICD-10-CM | POA: Diagnosis present

## 2020-09-10 DIAGNOSIS — D631 Anemia in chronic kidney disease: Secondary | ICD-10-CM | POA: Diagnosis present

## 2020-09-10 DIAGNOSIS — Z8673 Personal history of transient ischemic attack (TIA), and cerebral infarction without residual deficits: Secondary | ICD-10-CM | POA: Diagnosis not present

## 2020-09-10 DIAGNOSIS — K219 Gastro-esophageal reflux disease without esophagitis: Secondary | ICD-10-CM | POA: Diagnosis present

## 2020-09-10 DIAGNOSIS — G2581 Restless legs syndrome: Secondary | ICD-10-CM | POA: Diagnosis present

## 2020-09-10 DIAGNOSIS — I5043 Acute on chronic combined systolic (congestive) and diastolic (congestive) heart failure: Secondary | ICD-10-CM | POA: Diagnosis not present

## 2020-09-10 DIAGNOSIS — Z7951 Long term (current) use of inhaled steroids: Secondary | ICD-10-CM | POA: Diagnosis not present

## 2020-09-10 DIAGNOSIS — J810 Acute pulmonary edema: Secondary | ICD-10-CM | POA: Diagnosis not present

## 2020-09-10 DIAGNOSIS — M5416 Radiculopathy, lumbar region: Secondary | ICD-10-CM | POA: Diagnosis present

## 2020-09-10 DIAGNOSIS — M7918 Myalgia, other site: Secondary | ICD-10-CM | POA: Diagnosis present

## 2020-09-10 DIAGNOSIS — I160 Hypertensive urgency: Secondary | ICD-10-CM | POA: Diagnosis present

## 2020-09-10 DIAGNOSIS — I132 Hypertensive heart and chronic kidney disease with heart failure and with stage 5 chronic kidney disease, or end stage renal disease: Secondary | ICD-10-CM | POA: Diagnosis present

## 2020-09-10 DIAGNOSIS — Z809 Family history of malignant neoplasm, unspecified: Secondary | ICD-10-CM | POA: Diagnosis not present

## 2020-09-10 DIAGNOSIS — R5381 Other malaise: Secondary | ICD-10-CM | POA: Diagnosis present

## 2020-09-10 DIAGNOSIS — I5033 Acute on chronic diastolic (congestive) heart failure: Secondary | ICD-10-CM | POA: Diagnosis present

## 2020-09-10 DIAGNOSIS — I712 Thoracic aortic aneurysm, without rupture: Secondary | ICD-10-CM | POA: Diagnosis present

## 2020-09-10 DIAGNOSIS — E7849 Other hyperlipidemia: Secondary | ICD-10-CM | POA: Diagnosis present

## 2020-09-10 DIAGNOSIS — Z9115 Patient's noncompliance with renal dialysis: Secondary | ICD-10-CM | POA: Diagnosis not present

## 2020-09-10 DIAGNOSIS — J9601 Acute respiratory failure with hypoxia: Secondary | ICD-10-CM | POA: Diagnosis present

## 2020-09-10 DIAGNOSIS — N2581 Secondary hyperparathyroidism of renal origin: Secondary | ICD-10-CM | POA: Diagnosis present

## 2020-09-10 DIAGNOSIS — Z992 Dependence on renal dialysis: Secondary | ICD-10-CM | POA: Diagnosis not present

## 2020-09-10 DIAGNOSIS — R0602 Shortness of breath: Secondary | ICD-10-CM | POA: Diagnosis present

## 2020-09-10 DIAGNOSIS — E875 Hyperkalemia: Secondary | ICD-10-CM | POA: Diagnosis present

## 2020-09-10 LAB — BASIC METABOLIC PANEL
Anion gap: 19 — ABNORMAL HIGH (ref 5–15)
BUN: 41 mg/dL — ABNORMAL HIGH (ref 8–23)
CO2: 24 mmol/L (ref 22–32)
Calcium: 10.1 mg/dL (ref 8.9–10.3)
Chloride: 95 mmol/L — ABNORMAL LOW (ref 98–111)
Creatinine, Ser: 5.43 mg/dL — ABNORMAL HIGH (ref 0.61–1.24)
GFR, Estimated: 11 mL/min — ABNORMAL LOW (ref >60)
Glucose, Bld: 111 mg/dL — ABNORMAL HIGH (ref 70–99)
Potassium: 3.9 mmol/L (ref 3.5–5.1)
Sodium: 138 mmol/L (ref 135–145)

## 2020-09-10 LAB — RENAL FUNCTION PANEL
Albumin: 3.4 g/dL — ABNORMAL LOW (ref 3.5–5.0)
Anion gap: 24 — ABNORMAL HIGH (ref 5–15)
BUN: 136 mg/dL — ABNORMAL HIGH (ref 8–23)
CO2: 17 mmol/L — ABNORMAL LOW (ref 22–32)
Calcium: 10.1 mg/dL (ref 8.9–10.3)
Chloride: 103 mmol/L (ref 98–111)
Creatinine, Ser: 11.97 mg/dL — ABNORMAL HIGH (ref 0.61–1.24)
GFR, Estimated: 4 mL/min — ABNORMAL LOW (ref >60)
Glucose, Bld: 105 mg/dL — ABNORMAL HIGH (ref 70–99)
Phosphorus: 7.3 mg/dL — ABNORMAL HIGH (ref 2.5–4.6)
Potassium: 6.1 mmol/L — ABNORMAL HIGH (ref 3.5–5.1)
Sodium: 144 mmol/L (ref 135–145)

## 2020-09-10 LAB — CBC
HCT: 23.8 % — ABNORMAL LOW (ref 39.0–52.0)
HCT: 25.8 % — ABNORMAL LOW (ref 39.0–52.0)
Hemoglobin: 7.7 g/dL — ABNORMAL LOW (ref 13.0–17.0)
Hemoglobin: 8.5 g/dL — ABNORMAL LOW (ref 13.0–17.0)
MCH: 32.8 pg (ref 26.0–34.0)
MCH: 33.8 pg (ref 26.0–34.0)
MCHC: 32.4 g/dL (ref 30.0–36.0)
MCHC: 32.9 g/dL (ref 30.0–36.0)
MCV: 104.4 fL — ABNORMAL HIGH (ref 80.0–100.0)
MCV: 99.6 fL (ref 80.0–100.0)
Platelets: 131 10*3/uL — ABNORMAL LOW (ref 150–400)
Platelets: 153 10*3/uL (ref 150–400)
RBC: 2.28 MIL/uL — ABNORMAL LOW (ref 4.22–5.81)
RBC: 2.59 MIL/uL — ABNORMAL LOW (ref 4.22–5.81)
RDW: 16.6 % — ABNORMAL HIGH (ref 11.5–15.5)
RDW: 17.2 % — ABNORMAL HIGH (ref 11.5–15.5)
WBC: 5.3 10*3/uL (ref 4.0–10.5)
WBC: 6.1 10*3/uL (ref 4.0–10.5)
nRBC: 0 % (ref 0.0–0.2)
nRBC: 0 % (ref 0.0–0.2)

## 2020-09-10 LAB — RESP PANEL BY RT-PCR (FLU A&B, COVID) ARPGX2
Influenza A by PCR: NEGATIVE
Influenza B by PCR: NEGATIVE
SARS Coronavirus 2 by RT PCR: NEGATIVE

## 2020-09-10 LAB — SARS CORONAVIRUS 2 (TAT 6-24 HRS): SARS Coronavirus 2: NEGATIVE

## 2020-09-10 LAB — HEPATITIS B CORE ANTIBODY, TOTAL: Hep B Core Total Ab: NONREACTIVE

## 2020-09-10 LAB — CREATININE, SERUM
Creatinine, Ser: 11.96 mg/dL — ABNORMAL HIGH (ref 0.61–1.24)
GFR, Estimated: 4 mL/min — ABNORMAL LOW (ref >60)

## 2020-09-10 LAB — HEPATITIS B SURFACE ANTIBODY,QUALITATIVE: Hep B S Ab: NONREACTIVE

## 2020-09-10 LAB — HEPATITIS B SURFACE ANTIGEN: Hepatitis B Surface Ag: NONREACTIVE

## 2020-09-10 LAB — TROPONIN I (HIGH SENSITIVITY): Troponin I (High Sensitivity): 212 ng/L (ref <18)

## 2020-09-10 MED ORDER — NITROGLYCERIN 0.4 MG SL SUBL
SUBLINGUAL_TABLET | SUBLINGUAL | Status: AC
  Filled 2020-09-10: qty 1

## 2020-09-10 MED ORDER — NITROGLYCERIN 0.4 MG SL SUBL
0.4000 mg | SUBLINGUAL_TABLET | SUBLINGUAL | Status: DC | PRN
  Administered 2020-09-10: 0.4 mg via SUBLINGUAL

## 2020-09-10 MED ORDER — DARBEPOETIN ALFA 100 MCG/0.5ML IJ SOSY
PREFILLED_SYRINGE | INTRAMUSCULAR | Status: AC
  Administered 2020-09-10: 100 ug via INTRAVENOUS
  Filled 2020-09-10: qty 0.5

## 2020-09-10 MED ORDER — CALCITRIOL 0.5 MCG PO CAPS
ORAL_CAPSULE | ORAL | Status: AC
  Filled 2020-09-10: qty 2

## 2020-09-10 MED ORDER — NITROGLYCERIN 0.4 MG SL SUBL
SUBLINGUAL_TABLET | SUBLINGUAL | Status: AC
  Administered 2020-09-10: 0.4 mg via SUBLINGUAL
  Filled 2020-09-10: qty 1

## 2020-09-10 MED ORDER — ACETAMINOPHEN 325 MG PO TABS
ORAL_TABLET | ORAL | Status: AC
  Administered 2020-09-10: 650 mg via ORAL
  Filled 2020-09-10: qty 2

## 2020-09-10 MED ORDER — ASPIRIN EC 81 MG PO TBEC
81.0000 mg | DELAYED_RELEASE_TABLET | Freq: Every day | ORAL | Status: DC
  Administered 2020-09-10 – 2020-09-12 (×3): 81 mg via ORAL
  Filled 2020-09-10 (×3): qty 1

## 2020-09-10 NOTE — Consult Note (Addendum)
Cardiology Consultation:   Patient ID: Thomas Robinson MRN: [id number]; DOB: [birth year]  Admit date: 09/09/2020 Date of Consult: 09/10/2020  Primary Robinson Provider: Willeen Robinson, [facility] Cardiologist: Thomas Salines, DO  [facility] Electrophysiologist:  None    Patient Profile:   Thomas Robinson is a 60 y.o. male with a hx of normal coronary arteries by recent cath 08/27/20, ESRD (noncompliant with scheduled HD as below) chronic diastolic CHF with fluid management intended to be by dialysis, anemia, asthma, CVA, ascending aortic aneurysm (4.5cm by CT 05/2020 with f/u recommended by 10/2020), peripheral neuropathy, noncompliance who is being seen today for the evaluation of chest pain at the request of Dr. Hal Robinson.  History of Present Illness:   The patient has followed up with Dr. Harriet Robinson in the past for fluid overload in the context of ESRD unfortunately with patient noncompliance with hemodialysis or establishing with nephrology. He reports he got into an argument with someone at the [facility] and did not return there. He has since seen receiving dialysis through sporadic emergency room visits. Per Dr. Terrial Robinson last office visit he reported he was planning to obtain dialysis equipment from a family member to do his own in-house dialysis, despite medical advisement to the contrary. He was recently admitted 12/25-12/29 to [facility] with hypoxia after missing dialysis for 2 weeks. He had chest pain with elevated troponin so cardiac cath was pursued showing normal coronary arteries and normal LVEDP. 2D echo showed EF 65-70%, moderately dilated RV, mild BAE, mild AI/AS, mild TR, severe pulmonary HTN, mildly dilated ascending aorta. During admission he required transfusion for anemia with Hgb 6.9. He left AMA. He was seen again in the ED 09/05/19 with hypoxia after missing dialysis for a week. He was able to be dialyzed and discharged home.   He returned to the ED yesterday with  SOB, productive cough, edema, HTN and hypoxia requiring BiPAP. He was admitted for urgent dialysis. He also complained of some chest discomfort which he describes to me as persistent left sided pounding of his chest. This was possibly associated with some leg jerking. He also reports chronic back issues. He is somewhat of a poor historian and tangential at times. This discomfort continued all the way overnight into this morning and afternoon and finally eased off with dialysis.  Labs reveal potassium of 6.1, uremia of 136, Cr 12, macrocytic anemia with Hgb 7.2, lactic acid 1.3, hsTroponin 199->212, Covid negative. CXR unchanged cardiomegaly, unchanged pulmonary edema, small bilateral effusions. He is unable to tell me what prior chest pain in the past felt like.  EKGs are outlined below, felt consistent with metabolic shifts, and telemetry shows NSR/low-level sinus tach with occasional PACs, PVCs, and sporadic wandering atrial pacemaker.  Past Medical History:  Diagnosis Date  . Anaphylactic shock, unspecified, sequela 06/10/2019  . Arthritis   . Asthma, chronic, unspecified asthma severity, with acute exacerbation 10/23/2017  . CAP (community acquired pneumonia) 10/23/2017  . Carpal tunnel syndrome of right wrist 10/05/2018  . Cataract    right - removed by surgery  . Cerebral infarction due to thrombosis of cerebral artery ([facility])   . Cervical disc herniation 01/04/2020  . Chronic diastolic heart failure ([facility]) 04/13/2018  . Chronic low back pain 11/24/2019  . Constipation   . Cough    chronic cough  . Encounter for immunization 07/08/2017  . ESRD on hemodialysis ([facility]) 02/16/2018   Dialysis  T Thus Sat  - [facility]   .  Fall 06/04/2019  . GERD (gastroesophageal reflux disease) 06/04/2019  . GI bleeding 05/24/2017  . Gram-negative sepsis, unspecified ([place]) 09/05/2017  . History of fusion of cervical spine 03/26/2020  . Hyperlipidemia   . Hypertension   . Hypokalemia 06/04/2017  . Iron  deficiency anemia, unspecified 09/09/2017  . Left shoulder pain 11/11/2019  . Lumbar radiculopathy 11/24/2019  . Lung contusion 06/04/2019  . LVH (left ventricular hypertrophy) due to hypertensive disease, with heart failure ([facility]) 06/18/2020  . Macrocytic anemia 05/24/2017  . Moderate protein-calorie malnutrition ([place]) 06/06/2017  . Myofascial pain syndrome 06/12/2020  . Neuritis of right ulnar nerve 09/13/2018  . Non-compliance with renal dialysis ([facility]) 02/08/2020  . Oxygen deficiency 12/30/2019   O2 sats on RA 87% at PAT appt   . Pain in joint of right elbow 09/13/2018  . Renovascular hypertension 06/22/2020  . Restless leg syndrome   . Rib fractures 06/2019   Right  . Secondary hyperparathyroidism of renal origin ([facility]) 06/04/2017  . Thoracic ascending aortic aneurysm ([facility])    4.5 cm 06/04/19 CT  . Ulnar neuropathy 10/05/2018  . Volume overload 10/23/2017    Past Surgical History:  Procedure Laterality Date  . ANTERIOR CERVICAL DECOMP/DISCECTOMY FUSION N/A 01/04/2020   Procedure: ANTERIOR CERVICAL DECOMPRESSION/DISCECTOMY FUSION CERVICAL FIVE THROUGH SEVEN;  Surgeon: Melina Schools, MD;  Location: [facility];  Service: Orthopedics;  Laterality: N/A;  3 hrs  . AV FISTULA PLACEMENT Left 05/28/2017   Procedure: LEFT ARM ARTERIOVENOUS (AV) FISTULA CREATION;  Surgeon: Conrad Goltry, MD;  Location: [facility];  Service: Vascular;  Laterality: Left;  . EYE SURGERY Right 06/02/2019   Cataract removed  . FRACTURE SURGERY    . IR FLUORO GUIDE CV LINE RIGHT  05/25/2017  . IR US GUIDE VASC ACCESS RIGHT  05/25/2017  . REVISON OF ARTERIOVENOUS FISTULA Left 07/18/2019   Procedure: REVISION PLICATION OF RADIOCEPHALIC ARTERIOVENOUS FISTULA LEFT ARM;  Surgeon: Angelia Mould, MD;  Location: [facility];  Service: Vascular;  Laterality: Left;  . RINOPLASTY       Home Medications:  Prior to Admission medications   Medication Sig Start Date End Date Taking? Authorizing Provider  albuterol (VENTOLIN HFA) 108 (90 Base)  MCG/ACT inhaler Inhale 2 puffs into the lungs every 6 (six) hours as needed for wheezing or shortness of breath.    Yes [provider]  amLODipine (NORVASC) 10 MG tablet Take 10 mg by mouth daily.    Yes [provider]  atorvastatin (LIPITOR) 20 MG tablet Take 20 mg by mouth at bedtime. 04/19/20  Yes [provider]  AURYXIA 1 GM 210 MG(Fe) tablet Take 420 mg by mouth 3 (three) times daily with meals.  03/16/20  Yes [provider]  BREO ELLIPTA 100-25 MCG/INH AEPB Inhale 1 puff into the lungs daily. 06/26/20  Yes [provider]  cholecalciferol (VITAMIN D3) 25 MCG (1000 UNIT) tablet Take 1,000 Units by mouth daily.   Yes [provider]  cinacalcet (SENSIPAR) 60 MG tablet Take 60 mg by mouth daily.   Yes [provider]  furosemide (LASIX) 40 MG tablet Take 1 tablet (40 mg total) by mouth See admin instructions. On Sunday, Monday, Wednesday, Thursday and Saturday 08/14/20  Yes Tobb, Kardie, DO  gabapentin (NEURONTIN) 300 MG capsule Take 300 mg by mouth 2 (two) times daily as needed (pain).    Yes [provider]  hydrALAZINE (APRESOLINE) 25 MG tablet Take 50 mg by mouth 3 (three) times daily.   Yes [provider]  metoprolol tartrate (LOPRESSOR) 100 MG tablet Take 100 mg by mouth 2 (two) times daily.    Yes [provider]  multivitamin (RENA-VIT) TABS tablet Take 1 tablet by mouth daily. 01/12/18  Yes [provider]  sevelamer carbonate (RENVELA) 800 MG tablet Take 800 mg by mouth daily. 11/07/19  Yes [provider]  Specialty Vitamins Products (HEALTHY HEART COMPLEX PO) Take 1 tablet by mouth daily.   Yes [provider]    Inpatient Medications: Scheduled Meds: . amLODipine  10 mg Oral Daily  . aspirin EC  81 mg Oral Daily  . atorvastatin  20 mg Oral Daily  . calcitRIOL      . Chlorhexidine Gluconate Cloth  6 each Topical Q0600  . darbepoetin (ARANESP) injection - DIALYSIS   100 mcg Intravenous Q Mon-HD  . fluticasone furoate-vilanterol  1 puff Inhalation Daily  . gabapentin  300 mg Oral QHS  . heparin  5,000 Units Subcutaneous Q8H  . hydrALAZINE  50 mg Oral TID  . metoprolol tartrate  100 mg Oral BID  . multivitamin  1 tablet Oral Daily  . nitroGLYCERIN      . sevelamer carbonate  800 mg Oral TID WC   Continuous Infusions:  PRN Meds: acetaminophen **OR** acetaminophen, albuterol, hydrALAZINE, nitroGLYCERIN  Allergies:   No Known Allergies  Social History:   Social History   Socioeconomic History  . Marital status: Single    Spouse name: Not on file  . Number of children: Not on file  . Years of education: Not on file  . Highest education level: Not on file  Occupational History  . Not on file  Tobacco Use  . Smoking status: Never Smoker  . Smokeless tobacco: Never Used  Vaping Use  . Vaping Use: Never used  Substance and Sexual Activity  . Alcohol use: Not Currently    Alcohol/week: 0.0 standard drinks    Comment: has a drink once in a while- 12/30/19  . Drug use: No  . Sexual activity: Not Currently  Other Topics Concern  . Not on file  Social History Narrative   Lives with a roommate in a one story home.  Has 1 child.  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  Education: high school.   Social Determinants of Health   Financial Resource Strain: Not on file  Food Insecurity: Not on file  Transportation Needs: Not on file  Physical Activity: Not on file  Stress: Not on file  Social Connections: Not on file  Intimate Partner Violence: Not on file    Family History:   Family History  Problem Relation Age of Onset  . Cancer Mother      ROS:  Please see the history of present illness.  All other ROS reviewed and negative.     Physical Exam/Data:   Vitals:   09/10/20 1039 09/10/20 1109 09/10/20 1123 09/10/20 1225  BP: (!) 171/86 (!) 164/82  (!) 148/77  Pulse:    89  Resp: 16 16  20   Temp:   98 F (36.7 C) 97.6 F (36.4 C)   TempSrc:   Oral Oral  SpO2:   98% 96%  Weight:      Height:        Intake/Output Summary (Last 24 hours) at 09/10/2020 1251 Last data filed at 09/10/2020 1123 Gross per 24 hour  Intake -  Output 3437 ml  Net -3437 ml   Last 3 Weights 09/10/2020 09/10/2020 09/09/2020  Weight (  lbs) (No Data) (No Data) 143 lb 4.8 oz  Weight (kg) (No Data) (No Data) 65 kg     Body mass index is 21.79 kg/m.  General: Thin undernourished appearing WM, in no acute distress Head: Normocephalic, atraumatic, sclera non-icteric, no xanthomas, nares are without discharge. Neck: Negative for carotid bruits. JVP not elevated. Lungs: Clear bilaterally to auscultation without wheezes, rales, or rhonchi. Breathing is unlabored. Heart: RRR with prominent heart sounds S1 S2 with soft SEM, no rubs or gallops.  Abdomen: Soft, non-tender, non-distended with normoactive bowel sounds. No rebound/guarding. Extremities: No clubbing or cyanosis. No edema. Distal pedal pulses are 2+ and equal bilaterally. Neuro: Alert and oriented X 3. Moves all extremities spontaneously. Psych:  Responds to questions appropriately with a reserved affect  EKG:  The EKG was personally reviewed and demonstrates:   09/09/20 NSR 78bpm, peaked T waves in multiple leads, TWI avL, nonspecific STT changes V5-V6 09/10/20 NSR 97bpm, peaked T waves in multiple leads, TWI avL, nonspecific STTW changes V5-V6 09/10/20 ST 103bpm with less peaked T waves in multiple leads, one PAC, TWI I, avL, subtle ST sagging II, TWI V5-V6, QTc 530ms by tracing, 472-481ms by hand calculation - similar changes seen on prior tracings (baseline EKG is variable in Epic)  Telemetry:  Telemetry was personally reviewed and demonstrates:  NSR, occasional PACs, PVCs and wandering atrial pacemaker  Relevant CV Studies: Cardiac cath 08/27/20 Result Date: 08/27/2020 Procedure: Selective coronary angiography Left heart catheterization Indication: NSTEMI Arterial Access: 6 Fr RRA Diagnostic  Catheters: 5 Fr JL 3.5, 6 Fr Tiger, 5 Fr JR 4 and 5 Fr Pigtail Others: None Procedure in detail: After appropriate written informed consent, the patient was prepped and draped in the standard sterile fashion. A procedural timeout was performed to confirm the right side, right patient and right procedure. All Cath Lab staff were briefed and all questions were answered. The right radial artery was accessed using a 6 Fr sheath using standard Seldinger technique. A selective coronary angiogram was performed using the above-mentioned diagnostic catheters in multiple standard views and they were interpreted in the procedure. An LVEDP was measured at 12 mmHg. This demonstrated the following findings: LM: Normal LAD: Normal Lcx: Normal RCA: Normal At this time, all equipment was removed without complications and a transradial band was used to obtain hemostasis. No immediate complications were noted. The patient was safely transferred to the hospital/ICU bed. Total contrast used was 54 mL. The Cath Lab staff, patient, family, referring physician and all involved Robinson team members were briefed in detail regarding the proceedings in the Cath Lab. All questions were answered. Impression: Normal coronary arteries Normal left ventricular end-diastolic pressure Plan: 1. Secondary prevention of coronary artery disease 2. Medical management Horton Finer, MD MSc Assistant Professor of Medicine, [employer] Interventional and Critical Robinson Cardiologist [facility] [place] [facility] 08/27/2020 1:28 PM    2D Echo 08/28/20 [place] Eugenio Saenz [address] [place] [address] Transthoracic Echocardiogram Report Name: ANTONIA, CULBERTSON Date: 08/28/2020 Height: 44 in MRN: [id number] Patient Location: [id number] Weight: 133 lb DOB: 12/04/1960 Gender: Male BSA: 1.7 m2 Age: 13 yrs  Ethnicity: W BP: 140/65 mmHg Reason For Study: CHF; SOB (shortness of breath); NSTEMI (non-ST elevated myocardial infarction) ([place]); Acute encephalopathy; Elevated troponin; Hypertensive urgency History: NSTEMI Ordering Physician: Horton Finer Performed By: Lorie Phenix Referring Physician: Minna Merritts, [place]  A two-dimensional transthoracic echocardiogram with color flow and Doppler was performed. Image Quality: Technically adequate. - SUMMARY Left ventricular systolic function is normal. LV ejection fraction = 65-70%. There is moderate concentric left ventricular hypertrophy. No segmental wall motion abnormalities seen in the left ventricle Left ventricular filling pattern is prolonged relaxation. The right ventricle is moderately dilated. The right ventricular systolic function is normal. The left atrium is mildly dilated. The right atrium is mildly dilated. There is mild aortic stenosis. There is mild aortic regurgitation. There is mild tricuspid regurgitation. Severe pulmonary hypertension. Estimated right ventricular systolic pressure is 62-22 mmHg. Mildly dilated ascending aorta. Ascending aorta diameter 4.1 cm IVC size was severely dilated. There is a trivial pericardial effusion. There is no comparison study available. - FINDINGS: LEFT VENTRICLE The left ventricular size is normal. There is moderate concentric left ventricular hypertrophy. Left ventricular systolic function is normal. LV ejection fraction = 65-70%. Left ventricular filling pattern is prolonged relaxation. The left ventricular wall motion is normal. No segmental wall motion abnormalities seen in the left ventricle. LV WALL MOTION - RIGHT VENTRICLE The right ventricle is moderately dilated. The right ventricular systolic function is normal. LEFT ATRIUM The left atrium is mildly dilated. RIGHT ATRIUM The right atrium is mildly dilated. - AORTIC VALVE There is mild to moderate aortic valve thickening. There is mild  aortic regurgitation. There is mild aortic stenosis. Aortic valve mean pressure gradient is 14 mmHg. - MITRAL VALVE There is moderate mitral valve thickening. There is moderate mitral annular calcification. There is trace mitral regurgitation. No significant mitral valve stenosis. - TRICUSPID VALVE There is mild tricuspid valve thickening. There is mild tricuspid regurgitation. Severe pulmonary hypertension. Estimated right ventricular systolic pressure is 97-98 mmHg. - PULMONIC VALVE Structurally normal pulmonic valve. Trace pulmonic valvular regurgitation. There is no pulmonic valvular stenosis. - ARTERIES The aortic sinus is normal size. Mildly dilated ascending aorta. Ascending aorta diameter 4.1 cm. - VENOUS IVC size was severely dilated. - EFFUSION There is trivial pericardial effusion. No cardiac tamponade. - - MMode/2D Measurements & Calculations IVSd: 1.4 cm LA diam: 2.8 cm ESV(MOD-sp4): Ao sinus diam: LVIDd: 4.5 cm EDV(MOD-sp4): 20.5 ml 3.1 cm LVPWd: 1.4 cm 85.5 ml LVIDs: 2.7 cm _______________________________________________________________________ LVOT diam: LVAd ap4: 31.7 cm2SV(MOD-sp4): 65.0 ml 2.0 cm EDV(sp4-el): SI(MOD-sp4): 89.3 ml 37.8 ml/m2 LVAs ap4: 12.8 cm2 ESV(sp4-el): 19.0 ml Doppler Measurements & Calculations MV E max vel: MV dec time: SV(LVOT): 92.4 ml AI max vel: 104.0 cm/sec 0.30 sec Ao V2 max: 410.5 cm/sec MV A max vel: 274.7 cm/sec AI dec slope: 131.6 cm/sec Ao max PG: 30.2 mmHg265.7 cm/sec2 MV E/A: 0.79 Ao V2 mean: AI P1/2t: Med Peak E' Vel: 168.7 cm/sec 452.5 msec 4.2 cm/sec Ao mean PG: Lat Peak E' Vel: 14.3 mmHg 4.6 cm/sec Ao V2 VTI: 49.9 cm E/Lat E`: 22.8 AVA (VTI): 1.9 cm2 E/Med E`: 24.5 _______________________________________________________________________ LV V1 VTI: TR max vel: AS Dimensionless AVAi(VTI) 29.9 cm 437.7 cm/sec Index (VTI): 0.60 cm^2/m^2: 1.1 cm2 TR max PG: 76.6 mmHg _______________________________________________________________________ SV index(LVOT): 53.7 ml/m2  _______________________________________________________________________ Reading Physician: MD Georgeanna Harrison. Ottie Glazier 08/28/2020 06:28 PM        Laboratory Data:  High Sensitivity Troponin:   Recent Labs  Lab 09/09/20 2005 09/10/20 0005  TROPONINIHS 199* 212*     Chemistry Recent Labs  Lab 09/04/20 0450 09/09/20 2005 09/09/20 2018 09/10/20 0005 09/10/20 0322  NA 140 141 141  --  144  K 6.3* 6.1* 6.0*  --  6.1*  CL 102  102 108  --  103  CO2 20* 17*  --   --  17*  GLUCOSE 93 100* 89  --  105*  BUN 111* 136* >130*  --  136*  CREATININE 10.55* 12.08* 11.90* 11.96* 11.97*  CALCIUM 9.5 9.9  --   --  10.1  GFRNONAA 5* 4*  --  4* 4*  ANIONGAP 18* 22*  --   --  24*    Recent Labs  Lab 09/09/20 2005 09/10/20 0322  PROT 6.7  --   ALBUMIN 3.6 3.4*  AST 10*  --   ALT 17  --   ALKPHOS 69  --   BILITOT 1.0  --    Hematology Recent Labs  Lab 09/04/20 0450 09/09/20 2005 09/09/20 2018 09/10/20 0005  WBC 6.2 5.8  --  5.3  RBC 2.43* 2.14*  --  2.28*  HGB 7.7* 7.2* 8.8* 7.7*  HCT 24.4* 22.1* 26.0* 23.8*  MCV 100.4* 103.3*  --  104.4*  MCH 31.7 33.6  --  33.8  MCHC 31.6 32.6  --  32.4  RDW 16.6* 16.8*  --  16.6*  PLT 123* 155  --  131*   BNPNo results for input(s): BNP, PROBNP in the last 168 hours.  DDimer No results for input(s): DDIMER in the last 168 hours.   Radiology/Studies:  DG Chest Portable 1 View  Result Date: 09/09/2020 CLINICAL DATA:  Hypoxia and shortness of breath. Missed dialysis last week. EXAM: PORTABLE CHEST 1 VIEW COMPARISON:  Most recent radiograph 09/04/2020.  Additional priors. FINDINGS: Similar cardiomegaly. Unchanged mediastinal contours. Aortic atherosclerosis. Hazy lung base opacities blunting of the costophrenic angles likely small effusions. Peribronchial thickening with features of interstitial edema, similar to prior. No pneumothorax. No acute osseous abnormalities are seen. IMPRESSION: Unchanged cardiomegaly. Unchanged pulmonary edema.  Probable small bilateral pleural effusions. Findings consistent with CHF. Electronically Signed   By: Keith Rake M.D.   On: 09/09/2020 20:10     Assessment and Plan:   1. Acute hypoxic respiratory failure in context of ESRD with lack of OP f/u with dialysis schedule - per outpatient cardiology note, it appears there is generally poor judgment/insight and patient was planning to obtain home HD equipment and do it himself. Could there be an undiagnosed cognitive disorder at play here? Patient seems to understand the need for HD but definitely concerned for prognosis given repeated presentations of the same  2. Hyperkalemia - occurring in the context of missed HD, management per renal  3. Chest pain/elevated troponin - hsTroponin appears chronically elevated (has not been normal in our system back to 2020) - 199->212 this admission with relatively flat trend - recent cardiac cath for the same 08/27/20 fortunately showed totally normal coronaries and normal EF - suspect due to volume overload related to HD noncompliance with resultant HTN and pulmonary HTN, as well as underlying anemia - will discuss plan with MD since patient had recent reassuring cath and echo for the same - telemetry shows NSR/low-level sinus tach with occasional PACs, PVCs, and sporadic wandering atrial pacemaker. No other significant arrhythmias seen at this time - would check serum drug screen for completeness  4. Borderline QT prolongation - suspect due to metabolic derangement - avoid QT prolonging agents  5. Acute on chronic diastolic CHF with severe pulmonary HTN by most recent echo - severe pHTN likely due to noncompliance with dialysis. PA pressures can be reassessed in follow-up when consistent compliance is demonstrated with dialysis over several months' time - volume  management with HD  6. Thoracic aortic aneurysm - 4.5cm by CT in 05/2020 which was similar to 2020 - CXR with unchanged mediastinal  contours - CP resolved per patient - recommend consideration of repeat scan when seen in follow-up - Dr. Harriet Robinson last recommended f/u appt in 01/2021  7. Anemia - can consider additional transfusion for chest pain relief if felt indicated  8. Mild aortic stenosis/aortic insufficiency - not of any specific hemodynamic significance at present time - follow clinically as outpatient  9. HTN - BP improved s/p dialysis, follow on home regimen      HEAR Score (for undifferentiated chest pain):      New York Heart Association (NYHA) Functional Class NYHA class III-IV on admission due to lack of HD   For questions or updates, please contact [place] Please consult [email or web address] for contact info under    Signed, Charlie Pitter, PA-C  09/10/2020 12:51 PM   Attending Note:   The patient was seen and examined.  Agree with assessment and plan as noted above.  Changes made to the above note as needed.  Patient seen and independently examined with  Lesle Chris, PA .   We discussed all aspects of the encounter. I agree with the assessment and plan as stated above.  1.  Chest pain :  This is likely due to volume overlload due to his lack of regular dialysis. Cardiac cath several weeks ago reveal normal cors.  He does not need any further evaluation for this  Troponin elevation is due to CHF in the setting of ESRD.    2.  Chronic diastolic chf:   Likely due to his chronic volume overload  This will be impossible to treat effectively until he gets on a regular dialysis schedule   3.  Anemia:  Further plans per primary md    I have spent a total of 40 minutes with patient reviewing hospital  notes , telemetry, EKGs, labs and examining patient as well as establishing an assessment and plan that was discussed with the patient. > 50% of time was spent in direct patient Robinson.  [facility] will sign off.   Medication Recommendations:  He needs to get estabilished with a dialysis Robinson and  receive his dialysis on a regular scheduled basis.  Until he does this , it will be impossible to effectively treat his volume overload.   Other recommendations (labs, testing, etc):   Follow up as an outpatient:  With his primary md    Ramond Dial., MD, [facility] 09/10/2020, 1:47 PM [address]  [place] Pager [phone]

## 2020-09-10 NOTE — ED Notes (Signed)
Patient transported to dialysis via stretcher on O2 via [place] by ED tech.

## 2020-09-10 NOTE — ED Notes (Signed)
SDU Breakfast Ordered 

## 2020-09-10 NOTE — Progress Notes (Signed)
PROGRESS NOTE  Json Thomas Robinson XBD:[id number] DOB: 1961-02-10 DOA: 09/09/2020 PCP: [name], PA  HPI/Recap of past 24 hours: Thomas Robinson is a 60 y.o. male with history of ESRD on hemodialysis who has been discharged from local clinics has had last dialysis on September 04, 2020 presents to the ER because of worsening shortness of breath over the last 24 hours.  Has been having some pleuritic type of chest pain.  Has peripheral edema at least 3+ both bilateral lower extremities and hands.  And states he has been compliant with his blood pressure medicine.  ED Course: In the ER patient was hypoxic with sats in the 70s requiring BiPAP.  Chest x-ray shows pulmonary edema.  EKG shows tall T waves.  Potassium was around 6.1.  Covid test is pending.  Hemoglobin is at baseline.  Initially pulmonary critical care was consulted but stated that he was stable enough to be managed by hospitalist.  Nephrology has been consulted and urgent dialysis has been arranged.  Patient also was given calcium gluconate IV.  09/10/20: Patient was seen and examined at the HD center.  He reports dyspnea with minimal exertion.  Conversational dyspnea noted on exam.  He also reports swelling in his left upper extremity and lower extremities bilaterally.  He has been living with a friend and has missed sessions of his outpatient hemodialysis.  States he is not able to care for himself at home.  Assessment/Plan: Principal Problem:   Acute pulmonary edema (HCC) Active Problems:   Hypertensive urgency   ESRD on dialysis ([place])   Anemia in ESRD (end-stage renal disease) ([place])  Acute pulmonary edema secondary to volume overload from missing hemodialysis Was initially placed on BiPAP due to severe hypoxemia with O2 saturation in the 70s. Hemodialysis on 09/10/2020, appreciate nephrology's assistance  Hyperkalemia with EKG changes secondary to missing hemodialysis sessions Presented with serum potassium 6.3 He has  received 1 dose of calcium gluconate, Lokelma and sodium bicarbonate in the ED. Electrolytes addressed with hemodialysis Repeat BMP after hemodialysis.  Acute hypoxic respiratory failure secondary to volume overload/pulmonary edema Currently on 5 L to maintain O2 saturation greater than 90% Maintain O2 saturation greater than 90%. Wean off oxygen supplementation as tolerated  Volume overload secondary to missing hemodialysis Left upper extremity and bilateral lower extremity edema noted on exam Volume status addressed with hemodialysis Appreciate nephrology's assistance  Acute on chronic diastolic CHF in the setting of volume overload Last 2D echo done on 05/17/2019 showed LVEF greater than 65 Volume status addressed with hemodialysis  Elevated troponin secondary to volume overload Troponin peaked at 212 Nonspecific ST-T changes on clinical EKG  Prolonged QTC Twelve-lead EKG done on 09/10/2020 with QTC 512 Avoid QTC prolonging agent Repeat twelve-lead EKG after hemodialysis  High anion gap metallic acidosis in the setting of missing hemodialysis Presented with serum bicarb of 17 with anion gap of 24 Received sodium bicarb in the ED Electrolytes addressed with hemodialysis  Physical debility PT OT to assess Fall precautions TOC consulted for possible SNF placement.  Anemia of chronic disease in the setting of ESRD Presented with hemoglobin 7.2 No overt bleeding Continue to monitor   Code Status: Full code.  Family Communication: None at bedside.  Disposition Plan: Likely will discharge to SNF   Consultants:  Nephrology  Procedures:  HD on 09/10/2020  Antimicrobials:  None.  DVT prophylaxis: Subcu heparin 3 times daily  Status is: Inpatient    Dispo: The patient is from: Home.  Anticipated d/c is to: SNF.              Anticipated d/c date is: 09/12/2020               Patient currently no stable for discharge due to ongoing management of  volume overload from missing hemodialysis sessions.       Objective: Vitals:   09/10/20 1009 09/10/20 1039 09/10/20 1109 09/10/20 1123  BP: (!) 149/82 (!) 171/86 (!) 164/82   Pulse:      Resp: 19 16 16    Temp:    98 F (36.7 C)  TempSrc:    Oral  SpO2:    98%  Weight:      Height:        Intake/Output Summary (Last 24 hours) at 09/10/2020 1157 Last data filed at 09/10/2020 1123 Gross per 24 hour  Intake -  Output 3437 ml  Net -3437 ml   Filed Weights   09/09/20 1939  Weight: 65 kg    Exam:  . General: 60 y.o. year-old male well developed well nourished in no acute distress.  Alert and oriented x3. . Cardiovascular: Regular rate and rhythm with no rubs or gallops.  No thyromegaly or JVD noted.   [name] Respiratory: Diffuse rales bilaterally.  No wheeze noted.  Poor inspiratory effort.   . Abdomen: Soft nontender nondistended with normal bowel sounds x4 quadrants. . Musculoskeletal: 2+ pitting edema lower extremities bilaterally.  1+ pitting edema left upper extremity. . Skin: No ulcerative lesions noted or rashes . Psychiatry: Mood is appropriate for condition and setting   Data Reviewed: CBC: Recent Labs  Lab 09/04/20 0450 09/09/20 2005 09/09/20 2018 09/10/20 0005  WBC 6.2 5.8  --  5.3  NEUTROABS 5.2 4.9  --   --   HGB 7.7* 7.2* 8.8* 7.7*  HCT 24.4* 22.1* 26.0* 23.8*  MCV 100.4* 103.3*  --  104.4*  PLT 123* 155  --  623*   Basic Metabolic Panel: Recent Labs  Lab 09/04/20 0450 09/09/20 2005 09/09/20 2018 09/10/20 0005 09/10/20 0322  NA 140 141 141  --  144  K 6.3* 6.1* 6.0*  --  6.1*  CL 102 102 108  --  103  CO2 20* 17*  --   --  17*  GLUCOSE 93 100* 89  --  105*  BUN 111* 136* >130*  --  136*  CREATININE 10.55* 12.08* 11.90* 11.96* 11.97*  CALCIUM 9.5 9.9  --   --  10.1  PHOS  --   --   --   --  7.3*   GFR: Estimated Creatinine Clearance: 6 mL/min (A) (by C-G formula based on SCr of 11.97 mg/dL (H)). Liver Function Tests: Recent Labs  Lab  09/09/20 2005 09/10/20 0322  AST 10*  --   ALT 17  --   ALKPHOS 69  --   BILITOT 1.0  --   PROT 6.7  --   ALBUMIN 3.6 3.4*   No results for input(s): LIPASE, AMYLASE in the last 168 hours. No results for input(s): AMMONIA in the last 168 hours. Coagulation Profile: No results for input(s): INR, PROTIME in the last 168 hours. Cardiac Enzymes: No results for input(s): CKTOTAL, CKMB, CKMBINDEX, TROPONINI in the last 168 hours. BNP (last 3 results) No results for input(s): PROBNP in the last 8760 hours. HbA1C: No results for input(s): HGBA1C in the last 72 hours. CBG: No results for input(s): GLUCAP in the last 168 hours. Lipid Profile: No results for  input(s): CHOL, HDL, LDLCALC, TRIG, CHOLHDL, LDLDIRECT in the last 72 hours. Thyroid Function Tests: No results for input(s): TSH, T4TOTAL, FREET4, T3FREE, THYROIDAB in the last 72 hours. Anemia Panel: No results for input(s): VITAMINB12, FOLATE, FERRITIN, TIBC, IRON, RETICCTPCT in the last 72 hours. Urine analysis:    Component Value Date/Time   COLORURINE YELLOW 05/24/2017 1729   APPEARANCEUR HAZY (A) 05/24/2017 1729   LABSPEC 1.011 05/24/2017 1729   PHURINE 5.0 05/24/2017 1729   GLUCOSEU 50 (A) 05/24/2017 1729   HGBUR SMALL (A) 05/24/2017 1729   BILIRUBINUR NEGATIVE 05/24/2017 1729   KETONESUR NEGATIVE 05/24/2017 1729   PROTEINUR 100 (A) 05/24/2017 1729   UROBILINOGEN 0.2 04/09/2015 1527   NITRITE NEGATIVE 05/24/2017 1729   LEUKOCYTESUR TRACE (A) 05/24/2017 1729   Sepsis Labs: @LABRCNTIP (procalcitonin:4,lacticidven:4)  ) Recent Results (from the past 240 hour(s))  Resp Panel by RT-PCR (Flu A&B, Covid) Nasopharyngeal Swab     Status: None   Collection Time: 09/04/20  3:49 AM   Specimen: Nasopharyngeal Swab; Nasopharyngeal(NP) swabs in vial transport medium  Result Value Ref Range Status   SARS Coronavirus 2 by RT PCR NEGATIVE NEGATIVE Final    Comment: (NOTE) SARS-CoV-2 target nucleic acids are NOT DETECTED.  The  SARS-CoV-2 RNA is generally detectable in upper respiratory specimens during the acute phase of infection. The lowest concentration of SARS-CoV-2 viral copies this assay can detect is 138 copies/mL. A negative result does not preclude SARS-Cov-2 infection and should not be used as the sole basis for treatment or other patient management decisions. A negative result may occur with  improper specimen collection/handling, submission of specimen other than nasopharyngeal swab, presence of viral mutation(s) within the areas targeted by this assay, and inadequate number of viral copies(<138 copies/mL). A negative result must be combined with clinical observations, patient history, and epidemiological information. The expected result is Negative.  Fact Sheet for Patients:  EntrepreneurPulse.com.au  Fact Sheet for Healthcare Providers:  IncredibleEmployment.be  This test is no t yet approved or cleared by the [place] FDA and  has been authorized for detection and/or diagnosis of SARS-CoV-2 by FDA under an Emergency Use Authorization (EUA). This EUA will remain  in effect (meaning this test can be used) for the duration of the COVID-19 declaration under Section 564(b)(1) of the Act, 21 U.S.C.section 360bbb-3(b)(1), unless the authorization is terminated  or revoked sooner.       Influenza A by PCR NEGATIVE NEGATIVE Final   Influenza B by PCR NEGATIVE NEGATIVE Final    Comment: (NOTE) The Xpert Xpress SARS-CoV-2/FLU/RSV plus assay is intended as an aid in the diagnosis of influenza from Nasopharyngeal swab specimens and should not be used as a sole basis for treatment. Nasal washings and aspirates are unacceptable for Xpert Xpress SARS-CoV-2/FLU/RSV testing.  Fact Sheet for Patients: EntrepreneurPulse.com.au  Fact Sheet for Healthcare Providers: IncredibleEmployment.be  This test is not yet approved or  cleared by the [place] FDA and has been authorized for detection and/or diagnosis of SARS-CoV-2 by FDA under an Emergency Use Authorization (EUA). This EUA will remain in effect (meaning this test can be used) for the duration of the COVID-19 declaration under Section 564(b)(1) of the Act, 21 U.S.C. section 360bbb-3(b)(1), unless the authorization is terminated or revoked.  Performed at [facility], [place] [address], [place], [place] [address]   SARS CORONAVIRUS 2 (TAT 6-24 HRS) Nasopharyngeal Nasopharyngeal Swab     Status: None   Collection Time: 09/09/20  7:31 PM   Specimen: Nasopharyngeal  Swab  Result Value Ref Range Status   SARS Coronavirus 2 NEGATIVE NEGATIVE Final    Comment: (NOTE) SARS-CoV-2 target nucleic acids are NOT DETECTED.  The SARS-CoV-2 RNA is generally detectable in upper and lower respiratory specimens during the acute phase of infection. Negative results do not preclude SARS-CoV-2 infection, do not rule out co-infections with other pathogens, and should not be used as the sole basis for treatment or other patient management decisions. Negative results must be combined with clinical observations, patient history, and epidemiological information. The expected result is Negative.  Fact Sheet for Patients: SugarRoll.be  Fact Sheet for Healthcare Providers: https://www.woods-mathews.com/  This test is not yet approved or cleared by the [place] FDA and  has been authorized for detection and/or diagnosis of SARS-CoV-2 by FDA under an Emergency Use Authorization (EUA). This EUA will remain  in effect (meaning this test can be used) for the duration of the COVID-19 declaration under Se ction 564(b)(1) of the Act, 21 U.S.C. section 360bbb-3(b)(1), unless the authorization is terminated or revoked sooner.  Performed at [facility], [place] [address] [place], [place] [address]   Resp Panel by RT-PCR  (Flu A&B, Covid) Nasopharyngeal Swab     Status: None   Collection Time: 09/10/20  2:37 AM   Specimen: Nasopharyngeal Swab; Nasopharyngeal(NP) swabs in vial transport medium  Result Value Ref Range Status   SARS Coronavirus 2 by RT PCR NEGATIVE NEGATIVE Final    Comment: (NOTE) SARS-CoV-2 target nucleic acids are NOT DETECTED.  The SARS-CoV-2 RNA is generally detectable in upper respiratory specimens during the acute phase of infection. The lowest concentration of SARS-CoV-2 viral copies this assay can detect is 138 copies/mL. A negative result does not preclude SARS-Cov-2 infection and should not be used as the sole basis for treatment or other patient management decisions. A negative result may occur with  improper specimen collection/handling, submission of specimen other than nasopharyngeal swab, presence of viral mutation(s) within the areas targeted by this assay, and inadequate number of viral copies(<138 copies/mL). A negative result must be combined with clinical observations, patient history, and epidemiological information. The expected result is Negative.  Fact Sheet for Patients:  EntrepreneurPulse.com.au  Fact Sheet for Healthcare Providers:  IncredibleEmployment.be  This test is no t yet approved or cleared by the [place] FDA and  has been authorized for detection and/or diagnosis of SARS-CoV-2 by FDA under an Emergency Use Authorization (EUA). This EUA will remain  in effect (meaning this test can be used) for the duration of the COVID-19 declaration under Section 564(b)(1) of the Act, 21 U.S.C.section 360bbb-3(b)(1), unless the authorization is terminated  or revoked sooner.       Influenza A by PCR NEGATIVE NEGATIVE Final   Influenza B by PCR NEGATIVE NEGATIVE Final    Comment: (NOTE) The Xpert Xpress SARS-CoV-2/FLU/RSV plus assay is intended as an aid in the diagnosis of influenza from Nasopharyngeal swab specimens  and should not be used as a sole basis for treatment. Nasal washings and aspirates are unacceptable for Xpert Xpress SARS-CoV-2/FLU/RSV testing.  Fact Sheet for Patients: EntrepreneurPulse.com.au  Fact Sheet for Healthcare Providers: IncredibleEmployment.be  This test is not yet approved or cleared by the [place] FDA and has been authorized for detection and/or diagnosis of SARS-CoV-2 by FDA under an Emergency Use Authorization (EUA). This EUA will remain in effect (meaning this test can be used) for the duration of the COVID-19 declaration under Section 564(b)(1) of the Act, 21 U.S.C. section 360bbb-3(b)(1),  unless the authorization is terminated or revoked.  Performed at [facility], [place] [address], [place], [place] [address]       Studies: DG Chest Portable 1 View  Result Date: 09/09/2020 CLINICAL DATA:  Hypoxia and shortness of breath. Missed dialysis last week. EXAM: PORTABLE CHEST 1 VIEW COMPARISON:  Most recent radiograph 09/04/2020.  Additional priors. FINDINGS: Similar cardiomegaly. Unchanged mediastinal contours. Aortic atherosclerosis. Hazy lung base opacities blunting of the costophrenic angles likely small effusions. Peribronchial thickening with features of interstitial edema, similar to prior. No pneumothorax. No acute osseous abnormalities are seen. IMPRESSION: Unchanged cardiomegaly. Unchanged pulmonary edema. Probable small bilateral pleural effusions. Findings consistent with CHF. Electronically Signed   By: Keith Rake M.D.   On: 09/09/2020 20:10    Scheduled Meds: . amLODipine  10 mg Oral Daily  . aspirin EC  81 mg Oral Daily  . atorvastatin  20 mg Oral Daily  . calcitRIOL      . Chlorhexidine Gluconate Cloth  6 each Topical Q0600  . darbepoetin (ARANESP) injection - DIALYSIS  100 mcg Intravenous Q Mon-HD  . fluticasone furoate-vilanterol  1 puff Inhalation Daily  . gabapentin  300 mg Oral QHS  . heparin   5,000 Units Subcutaneous Q8H  . hydrALAZINE  50 mg Oral TID  . metoprolol tartrate  100 mg Oral BID  . multivitamin  1 tablet Oral Daily  . nitroGLYCERIN      . sevelamer carbonate  800 mg Oral TID WC    Continuous Infusions:   LOS: 0 days     Kayleen Memos, MD [facility] Pager [phone]  If 7PM-7AM, please contact night-coverage [email or web address] Password [facility] 09/10/2020, 11:57 AM

## 2020-09-10 NOTE — ED Notes (Signed)
Pt refused vitals 

## 2020-09-10 NOTE — Progress Notes (Addendum)
Thomas Robinson Progress Note   Subjective:   Patient seen and examined at bedside during dialysis.  Reports shortness of breath and edema.  Most bothersome is edema in LUE.  Denies n/v/d, palpitations, abdominal pain, weakness, dizziness and fatigue.  States he wants to get back into regular dialysis center.   Objective Vitals:   09/10/20 0909 09/10/20 0939 09/10/20 1009 09/10/20 1039  BP: (!) 167/100 (!) 160/87 (!) 149/82 (!) 171/86  Pulse:      Resp: 18 20 19 16   Temp:      TempSrc:      SpO2:      Weight:      Height:       Physical Exam General:chronically ill appearing male in NAD Heart:RRR Lungs:+crackles/wheezing throughout, increased WOB with conversation - on 5L via [place] Abdomen:soft, NTND Extremities:2+ edema from hips to feet Dialysis Access: LU AVF cannulated   Filed Weights   09/09/20 1939  Weight: 65 kg   No intake or output data in the 24 hours ending 09/10/20 1044  Additional Objective Labs: Basic Metabolic Panel: Recent Labs  Lab 09/04/20 0450 09/09/20 2005 09/09/20 2018 09/10/20 0005 09/10/20 0322  NA 140 141 141  --  144  K 6.3* 6.1* 6.0*  --  6.1*  CL 102 102 108  --  103  CO2 20* 17*  --   --  17*  GLUCOSE 93 100* 89  --  105*  BUN 111* 136* >130*  --  136*  CREATININE 10.55* 12.08* 11.90* 11.96* 11.97*  CALCIUM 9.5 9.9  --   --  10.1  PHOS  --   --   --   --  7.3*   Liver Function Tests: Recent Labs  Lab 09/09/20 2005 09/10/20 0322  AST 10*  --   ALT 17  --   ALKPHOS 69  --   BILITOT 1.0  --   PROT 6.7  --   ALBUMIN 3.6 3.4*   CBC: Recent Labs  Lab 09/04/20 0450 09/09/20 2005 09/09/20 2018 09/10/20 0005  WBC 6.2 5.8  --  5.3  NEUTROABS 5.2 4.9  --   --   HGB 7.7* 7.2* 8.8* 7.7*  HCT 24.4* 22.1* 26.0* 23.8*  MCV 100.4* 103.3*  --  104.4*  PLT 123* 155  --  131*   Studies/Results: DG Chest Portable 1 View  Result Date: 09/09/2020 CLINICAL DATA:  Hypoxia and shortness of breath. Missed dialysis last week.  EXAM: PORTABLE CHEST 1 VIEW COMPARISON:  Most recent radiograph 09/04/2020.  Additional priors. FINDINGS: Similar cardiomegaly. Unchanged mediastinal contours. Aortic atherosclerosis. Hazy lung base opacities blunting of the costophrenic angles likely small effusions. Peribronchial thickening with features of interstitial edema, similar to prior. No pneumothorax. No acute osseous abnormalities are seen. IMPRESSION: Unchanged cardiomegaly. Unchanged pulmonary edema. Probable small bilateral pleural effusions. Findings consistent with CHF. Electronically Signed   By: Keith Rake M.D.   On: 09/09/2020 20:10    Medications:  . calcitRIOL      . amLODipine  10 mg Oral Daily  . aspirin EC  81 mg Oral Daily  . atorvastatin  20 mg Oral Daily  . Chlorhexidine Gluconate Cloth  6 each Topical Q0600  . darbepoetin (ARANESP) injection - DIALYSIS  100 mcg Intravenous Q Mon-HD  . fluticasone furoate-vilanterol  1 puff Inhalation Daily  . gabapentin  300 mg Oral QHS  . heparin  5,000 Units Subcutaneous Q8H  . hydrALAZINE  50 mg Oral TID  . metoprolol tartrate  100 mg Oral BID  . multivitamin  1 tablet Oral Daily  . nitroGLYCERIN      . sevelamer carbonate  800 mg Oral TID WC    Dialysis Orders: No outpatient center  Assessment/Plan: 1. Acute hypoxic respiratory failure - covid negative.  Volume overloaded. CXR with pulmonary edema and possible pleural effusions. HD today with net UF goal 5.5L.  May require additional HD tomorrow. Reassess volume status in AM.  2. Hyperkalemia - d/t missed HD. Medically treated in ED. K 6.1 - using low K bath today.   3. ESRD - on HD. No outpatient center - discharged due to aggressive behavior towards staff.  Last HD 1 week ago.  4. Anemia of CKD- Hgb 7.7.  Aranesp 120mcg ordered.   5. Secondary hyperparathyroidism - Ca elevated. No VDRA. Phos elevated, continue renvela 800mg  TID.  6. HTN - Blood pressure elevated likely partially due to volume overload.  On  amlodipine 10 qd, lopressor 100mg  BID, hydralazine 50mg  TID,  7. Nutrition - Renal diet w/fluid restrictions.   Jen Mow, PA-C [facility] 09/10/2020,10:44 AM  LOS: 0 days   Pt seen, examined and agree w A/P as above.  [name]  MD 09/10/2020, 1:27 PM

## 2020-09-10 NOTE — ED Notes (Signed)
Pt refused bp cuff.

## 2020-09-10 NOTE — ED Notes (Signed)
Pt refusing bp cuff to be on

## 2020-09-10 NOTE — Progress Notes (Signed)
Patient removed PIV from arm.  Patient has also taken oxygen and telemetry monitoring off to take himself to bathroom after has been informed that he was to remain on telemetry at all times.  Refuses to have PIV restarted at this time.

## 2020-09-10 NOTE — Progress Notes (Signed)
Patient complained of chest heaviness, pounding headache.  Given tylenol and nitro.  Stat EKG reassuring - showed tachycardia with PAC. HR 103.

## 2020-09-10 NOTE — ED Notes (Signed)
Md notified of pts hypertension and troponin.

## 2020-09-10 NOTE — Progress Notes (Signed)
Post HD tx pt. C/o of chest heaviness and pounding headache. Jen Mow, PA made aware saw pt. At bedside. Orders for stat ekg nitro and tylenol given as ordered

## 2020-09-11 DIAGNOSIS — J810 Acute pulmonary edema: Secondary | ICD-10-CM | POA: Diagnosis not present

## 2020-09-11 MED ORDER — PROSOURCE PLUS PO LIQD
30.0000 mL | Freq: Two times a day (BID) | ORAL | Status: DC
  Filled 2020-09-11 (×3): qty 30

## 2020-09-11 MED ORDER — HEPARIN SODIUM (PORCINE) 5000 UNIT/ML IJ SOLN: 5000.0000 [IU] | Freq: Three times a day (TID) | INTRAMUSCULAR | Status: DC

## 2020-09-11 MED ORDER — NEPRO/CARBSTEADY PO LIQD: 237.0000 mL | Freq: Two times a day (BID) | ORAL | Status: DC

## 2020-09-11 NOTE — Progress Notes (Addendum)
PROGRESS NOTE  Duel Conrad EXB:[id number] DOB: 1961-07-01 DOA: 09/09/2020 PCP: [name], PA  HPI/Recap of past 24 hours: Tray Klayman is a 60 y.o. male with history of ESRD on hemodialysis who has been discharged from local clinics due to behavioral issues, comes to the ED frequently to get his hemodialysis.  Last hemodialysis prior to this presentation was on September 04, 2020.    He presents to the ER with complaints of worsening shortness of breath over the last 24 hours.  Associated with pleuritic chest pain worse when taking a deep breath.  He was severely volume overloaded on exam and his serum potassium was 6.3.  He received 1 dose of calcium gluconate, Lokelma, and sodium bicarbonate in the ED.  Nephrology was consulted and urgent dialysis was arranged.  Hemodialyzed on 09/10/2020, tolerated well.  09/11/20: Patient was seen and examined at his bedside this morning.  He feels significantly improved today.  He no longer feels dyspneic at rest.  His volume status is also improved.  He denies any chest pain at the time of this visit.  Assessment/Plan: Principal Problem:   Acute pulmonary edema (HCC) Active Problems:   Hypertensive urgency   ESRD on dialysis ([place])   Anemia in ESRD (end-stage renal disease) ([place])  Acute pulmonary edema secondary to volume overload from missing hemodialysis Was initially placed on BiPAP due to severe hypoxemia with O2 saturation in the 70s. Hemodialysis on 09/10/2020, tolerated well. Appreciate nephrology's assistance  Resolved hyperkalemia with EKG changes secondary to missing hemodialysis sessions Presented with serum potassium 6.3>> 3.9. He has received 1 dose of calcium gluconate, Lokelma and sodium bicarbonate in the ED. Was hemodialyzed on 09/10/2020, tolerated well. Electrolytes managed through hemodialysis.  Improving acute hypoxic respiratory failure secondary to volume overload/pulmonary edema Currently on 2 L with O2 saturation  in the mid 90s.   Maintain O2 saturation greater than 90%. Continue to wean off oxygen supplementation as tolerated  Improving volume overload secondary to missing hemodialysis Left upper extremity and bilateral lower extremity edema noted on exam Volume status addressed with hemodialysis Appreciate nephrology's assistance  Acute on chronic diastolic CHF in the setting of volume overload Last 2D echo done on 05/17/2019 showed LVEF greater than 65 Volume status addressed with hemodialysis  Elevated troponin secondary to volume overload Troponin peaked at 212 Nonspecific ST-T changes on clinical EKG  Resolved prolonged QTC post hemodialysis Twelve-lead EKG done on 09/10/2020 with QTC 512 Avoid QTC prolonging agent Repeated twelve-lead EKG QTC 451 on 09/11/2020.  Resolving high anion gap metallic acidosis in the setting of missing hemodialysis Presented with serum bicarb of 17 with anion gap of 24 Received sodium bicarb in the ED and was hemodialyzed urgently.  Physical debility PT OT to assess, recommended outpatient PT Continue fall precautions TOC consulted for possible SNF placement, requested by the patient.  Anemia of chronic disease in the setting of ESRD Presented with hemoglobin 7.2>> 8.5 post hemodialysis No overt bleeding Continue to monitor H&H   Code Status: Full code.  Family Communication: None at bedside.  Disposition Plan: Likely will discharge to home   Consultants:  Nephrology  Procedures:  HD on 09/10/2020  Antimicrobials:  None.  DVT prophylaxis: Subcu heparin 3 times daily  Status is: Inpatient    Dispo: The patient is from: Home.              Anticipated d/c is to: Home.  Anticipated d/c date is: 09/12/2020               Patient currently no stable for discharge due to ongoing management of volume overload from missing hemodialysis sessions.       Objective: Vitals:   09/11/20 0812 09/11/20 0855 09/11/20 0953  09/11/20 1300  BP:  132/67 (!) 143/72 121/78  Pulse:  70 70 64  Resp:  16  (!) 21  Temp: (P) 97.8 F (36.6 C) 97.8 F (36.6 C)  97.8 F (36.6 C)  TempSrc: (P) Oral Oral  Oral  SpO2:  98%  94%  Weight:      Height:       No intake or output data in the 24 hours ending 09/11/20 1334 Filed Weights   09/09/20 1939  Weight: 65 kg    Exam:  . General: 60 y.o. year-old male bladder evaluation no cassettes.  Alert and oriented x3.   . Cardiovascular: Regular rate and rhythm no rubs or gallops.  [name] Respiratory: Mild rales at bases.  No wheezing noted.  Good inspiratory effort. . Abdomen: Soft nontender normal bowel sounds present.  . Musculoskeletal: Left upper extremity edema.  Trace lower extremity edema bilaterally.   [name] Psychiatry: Mood is appropriate for condition and setting.  Data Reviewed: CBC: Recent Labs  Lab 09/09/20 2005 09/09/20 2018 09/10/20 0005 09/10/20 1439  WBC 5.8  --  5.3 6.1  NEUTROABS 4.9  --   --   --   HGB 7.2* 8.8* 7.7* 8.5*  HCT 22.1* 26.0* 23.8* 25.8*  MCV 103.3*  --  104.4* 99.6  PLT 155  --  131* 607   Basic Metabolic Panel: Recent Labs  Lab 09/09/20 2005 09/09/20 2018 09/10/20 0005 09/10/20 0322 09/10/20 1439  NA 141 141  --  144 138  K 6.1* 6.0*  --  6.1* 3.9  CL 102 108  --  103 95*  CO2 17*  --   --  17* 24  GLUCOSE 100* 89  --  105* 111*  BUN 136* >130*  --  136* 41*  CREATININE 12.08* 11.90* 11.96* 11.97* 5.43*  CALCIUM 9.9  --   --  10.1 10.1  PHOS  --   --   --  7.3*  --    GFR: Estimated Creatinine Clearance: 13.1 mL/min (A) (by C-G formula based on SCr of 5.43 mg/dL (H)). Liver Function Tests: Recent Labs  Lab 09/09/20 2005 09/10/20 0322  AST 10*  --   ALT 17  --   ALKPHOS 69  --   BILITOT 1.0  --   PROT 6.7  --   ALBUMIN 3.6 3.4*   No results for input(s): LIPASE, AMYLASE in the last 168 hours. No results for input(s): AMMONIA in the last 168 hours. Coagulation Profile: No results for input(s): INR, PROTIME in  the last 168 hours. Cardiac Enzymes: No results for input(s): CKTOTAL, CKMB, CKMBINDEX, TROPONINI in the last 168 hours. BNP (last 3 results) No results for input(s): PROBNP in the last 8760 hours. HbA1C: No results for input(s): HGBA1C in the last 72 hours. CBG: No results for input(s): GLUCAP in the last 168 hours. Lipid Profile: No results for input(s): CHOL, HDL, LDLCALC, TRIG, CHOLHDL, LDLDIRECT in the last 72 hours. Thyroid Function Tests: No results for input(s): TSH, T4TOTAL, FREET4, T3FREE, THYROIDAB in the last 72 hours. Anemia Panel: No results for input(s): VITAMINB12, FOLATE, FERRITIN, TIBC, IRON, RETICCTPCT in the last 72 hours. Urine analysis:    Component  Value Date/Time   COLORURINE YELLOW 05/24/2017 1729   APPEARANCEUR HAZY (A) 05/24/2017 1729   LABSPEC 1.011 05/24/2017 1729   PHURINE 5.0 05/24/2017 1729   GLUCOSEU 50 (A) 05/24/2017 1729   HGBUR SMALL (A) 05/24/2017 1729   BILIRUBINUR NEGATIVE 05/24/2017 1729   KETONESUR NEGATIVE 05/24/2017 1729   PROTEINUR 100 (A) 05/24/2017 1729   UROBILINOGEN 0.2 04/09/2015 1527   NITRITE NEGATIVE 05/24/2017 1729   LEUKOCYTESUR TRACE (A) 05/24/2017 1729   Sepsis Labs: @LABRCNTIP (procalcitonin:4,lacticidven:4)  ) Recent Results (from the past 240 hour(s))  Resp Panel by RT-PCR (Flu A&B, Covid) Nasopharyngeal Swab     Status: None   Collection Time: 09/04/20  3:49 AM   Specimen: Nasopharyngeal Swab; Nasopharyngeal(NP) swabs in vial transport medium  Result Value Ref Range Status   SARS Coronavirus 2 by RT PCR NEGATIVE NEGATIVE Final    Comment: (NOTE) SARS-CoV-2 target nucleic acids are NOT DETECTED.  The SARS-CoV-2 RNA is generally detectable in upper respiratory specimens during the acute phase of infection. The lowest concentration of SARS-CoV-2 viral copies this assay can detect is 138 copies/mL. A negative result does not preclude SARS-Cov-2 infection and should not be used as the sole basis for treatment  or other patient management decisions. A negative result may occur with  improper specimen collection/handling, submission of specimen other than nasopharyngeal swab, presence of viral mutation(s) within the areas targeted by this assay, and inadequate number of viral copies(<138 copies/mL). A negative result must be combined with clinical observations, patient history, and epidemiological information. The expected result is Negative.  Fact Sheet for Patients:  EntrepreneurPulse.com.au  Fact Sheet for Healthcare Providers:  IncredibleEmployment.be  This test is no t yet approved or cleared by the [place] FDA and  has been authorized for detection and/or diagnosis of SARS-CoV-2 by FDA under an Emergency Use Authorization (EUA). This EUA will remain  in effect (meaning this test can be used) for the duration of the COVID-19 declaration under Section 564(b)(1) of the Act, 21 U.S.C.section 360bbb-3(b)(1), unless the authorization is terminated  or revoked sooner.       Influenza A by PCR NEGATIVE NEGATIVE Final   Influenza B by PCR NEGATIVE NEGATIVE Final    Comment: (NOTE) The Xpert Xpress SARS-CoV-2/FLU/RSV plus assay is intended as an aid in the diagnosis of influenza from Nasopharyngeal swab specimens and should not be used as a sole basis for treatment. Nasal washings and aspirates are unacceptable for Xpert Xpress SARS-CoV-2/FLU/RSV testing.  Fact Sheet for Patients: EntrepreneurPulse.com.au  Fact Sheet for Healthcare Providers: IncredibleEmployment.be  This test is not yet approved or cleared by the [place] FDA and has been authorized for detection and/or diagnosis of SARS-CoV-2 by FDA under an Emergency Use Authorization (EUA). This EUA will remain in effect (meaning this test can be used) for the duration of the COVID-19 declaration under Section 564(b)(1) of the Act, 21 U.S.C. section  360bbb-3(b)(1), unless the authorization is terminated or revoked.  Performed at [facility], [place] [address], [place], [place] [address]   SARS CORONAVIRUS 2 (TAT 6-24 HRS) Nasopharyngeal Nasopharyngeal Swab     Status: None   Collection Time: 09/09/20  7:31 PM   Specimen: Nasopharyngeal Swab  Result Value Ref Range Status   SARS Coronavirus 2 NEGATIVE NEGATIVE Final    Comment: (NOTE) SARS-CoV-2 target nucleic acids are NOT DETECTED.  The SARS-CoV-2 RNA is generally detectable in upper and lower respiratory specimens during the acute phase of infection. Negative results do not preclude SARS-CoV-2  infection, do not rule out co-infections with other pathogens, and should not be used as the sole basis for treatment or other patient management decisions. Negative results must be combined with clinical observations, patient history, and epidemiological information. The expected result is Negative.  Fact Sheet for Patients: SugarRoll.be  Fact Sheet for Healthcare Providers: https://www.woods-mathews.com/  This test is not yet approved or cleared by the [place] FDA and  has been authorized for detection and/or diagnosis of SARS-CoV-2 by FDA under an Emergency Use Authorization (EUA). This EUA will remain  in effect (meaning this test can be used) for the duration of the COVID-19 declaration under Se ction 564(b)(1) of the Act, 21 U.S.C. section 360bbb-3(b)(1), unless the authorization is terminated or revoked sooner.  Performed at [facility], [place] [address], [place], [place] [address]   Resp Panel by RT-PCR (Flu A&B, Covid) Nasopharyngeal Swab     Status: None   Collection Time: 09/10/20  2:37 AM   Specimen: Nasopharyngeal Swab; Nasopharyngeal(NP) swabs in vial transport medium  Result Value Ref Range Status   SARS Coronavirus 2 by RT PCR NEGATIVE NEGATIVE Final    Comment: (NOTE) SARS-CoV-2 target nucleic acids are  NOT DETECTED.  The SARS-CoV-2 RNA is generally detectable in upper respiratory specimens during the acute phase of infection. The lowest concentration of SARS-CoV-2 viral copies this assay can detect is 138 copies/mL. A negative result does not preclude SARS-Cov-2 infection and should not be used as the sole basis for treatment or other patient management decisions. A negative result may occur with  improper specimen collection/handling, submission of specimen other than nasopharyngeal swab, presence of viral mutation(s) within the areas targeted by this assay, and inadequate number of viral copies(<138 copies/mL). A negative result must be combined with clinical observations, patient history, and epidemiological information. The expected result is Negative.  Fact Sheet for Patients:  EntrepreneurPulse.com.au  Fact Sheet for Healthcare Providers:  IncredibleEmployment.be  This test is no t yet approved or cleared by the [place] FDA and  has been authorized for detection and/or diagnosis of SARS-CoV-2 by FDA under an Emergency Use Authorization (EUA). This EUA will remain  in effect (meaning this test can be used) for the duration of the COVID-19 declaration under Section 564(b)(1) of the Act, 21 U.S.C.section 360bbb-3(b)(1), unless the authorization is terminated  or revoked sooner.       Influenza A by PCR NEGATIVE NEGATIVE Final   Influenza B by PCR NEGATIVE NEGATIVE Final    Comment: (NOTE) The Xpert Xpress SARS-CoV-2/FLU/RSV plus assay is intended as an aid in the diagnosis of influenza from Nasopharyngeal swab specimens and should not be used as a sole basis for treatment. Nasal washings and aspirates are unacceptable for Xpert Xpress SARS-CoV-2/FLU/RSV testing.  Fact Sheet for Patients: EntrepreneurPulse.com.au  Fact Sheet for Healthcare Providers: IncredibleEmployment.be  This test is not  yet approved or cleared by the [place] FDA and has been authorized for detection and/or diagnosis of SARS-CoV-2 by FDA under an Emergency Use Authorization (EUA). This EUA will remain in effect (meaning this test can be used) for the duration of the COVID-19 declaration under Section 564(b)(1) of the Act, 21 U.S.C. section 360bbb-3(b)(1), unless the authorization is terminated or revoked.  Performed at [facility], [place] [address] [place], [place] [address]       Studies: No results found.  Scheduled Meds: . amLODipine  10 mg Oral Daily  . aspirin EC  81 mg Oral Daily  .  atorvastatin  20 mg Oral Daily  . Chlorhexidine Gluconate Cloth  6 each Topical Q0600  . darbepoetin (ARANESP) injection - DIALYSIS  100 mcg Intravenous Q Mon-HD  . fluticasone furoate-vilanterol  1 puff Inhalation Daily  . gabapentin  300 mg Oral QHS  . [START ON 09/13/2020] heparin  5,000 Units Subcutaneous Q8H  . hydrALAZINE  50 mg Oral TID  . metoprolol tartrate  100 mg Oral BID  . multivitamin  1 tablet Oral Daily  . sevelamer carbonate  800 mg Oral TID WC    Continuous Infusions:   LOS: 1 day     Kayleen Memos, MD [facility] Pager [phone]  If 7PM-7AM, please contact night-coverage [email or web address] Password [email or web address] 09/11/2020, 1:34 PM

## 2020-09-11 NOTE — Progress Notes (Signed)
[facility] Progress Note   Subjective:   Patient seen in room, feeling much better.  Leg swelling gone, L arm edema persists. No SOB, occ cough.   Objective Vitals:   09/11/20 0721 09/11/20 0812 09/11/20 0855 09/11/20 0953  BP:   132/67 (!) 143/72  Pulse:   70 70  Resp:   16   Temp:  (P) 97.8 F (36.6 C) 97.8 F (36.6 C)   TempSrc:  (P) Oral Oral   SpO2: 92%  98%   Weight:      Height:       Physical Exam General:chronically ill appearing male in NAD Heart:RRR Chest: mostly clear, improved Abdomen:soft, NTND Extremities: LE edema resolved, LUE edema remains 2+ Dialysis Access: LU AVF cannulated   Dialysis Orders: No outpatient center  Assessment/Plan: 1. Acute hypoxic respiratory failure - covid negative.  Volume overloaded. CXR with pulmonary edema and possible pleural effusions. HD yest w/ 3.5 L off.  Vol status much better and azotemia sig improved. Plan HD tomorrow.  2. LUE edema - will ask IR to see for fistulogram.  3. ESRD - on HD. No outpatient center - discharged due to aggressive behavior towards staff.  Last HD 1 week ago.  4. Anemia of CKD- Hgb 7.7.  Aranesp 155mcg ordered.   5. Secondary hyperparathyroidism - Ca elevated. No VDRA. Phos elevated, continue renvela 800mg  TID.  6. HTN - Blood pressure elevated likely partially due to volume overload.  On amlodipine 10 qd, lopressor 100mg  BID, hydralazine 50mg  TID 7. Nutrition - Renal diet w/fluid restrictions.    [name]  MD 09/11/2020, 11:52 AM     Filed Weights   09/09/20 1939  Weight: 65 kg   No intake or output data in the 24 hours ending 09/11/20 1152  Additional Objective Labs: Basic Metabolic Panel: Recent Labs  Lab 09/09/20 2005 09/09/20 2018 09/10/20 0005 09/10/20 0322 09/10/20 1439  NA 141 141  --  144 138  K 6.1* 6.0*  --  6.1* 3.9  CL 102 108  --  103 95*  CO2 17*  --   --  17* 24  GLUCOSE 100* 89  --  105* 111*  BUN 136* >130*  --  136* 41*  CREATININE  12.08* 11.90* 11.96* 11.97* 5.43*  CALCIUM 9.9  --   --  10.1 10.1  PHOS  --   --   --  7.3*  --    Liver Function Tests: Recent Labs  Lab 09/09/20 2005 09/10/20 0322  AST 10*  --   ALT 17  --   ALKPHOS 69  --   BILITOT 1.0  --   PROT 6.7  --   ALBUMIN 3.6 3.4*   CBC: Recent Labs  Lab 09/09/20 2005 09/09/20 2018 09/10/20 0005 09/10/20 1439  WBC 5.8  --  5.3 6.1  NEUTROABS 4.9  --   --   --   HGB 7.2* 8.8* 7.7* 8.5*  HCT 22.1* 26.0* 23.8* 25.8*  MCV 103.3*  --  104.4* 99.6  PLT 155  --  131* 153   Studies/Results: DG Chest Portable 1 View  Result Date: 09/09/2020 CLINICAL DATA:  Hypoxia and shortness of breath. Missed dialysis last week. EXAM: PORTABLE CHEST 1 VIEW COMPARISON:  Most recent radiograph 09/04/2020.  Additional priors. FINDINGS: Similar cardiomegaly. Unchanged mediastinal contours. Aortic atherosclerosis. Hazy lung base opacities blunting of the costophrenic angles likely small effusions. Peribronchial thickening with features of interstitial edema, similar to prior. No pneumothorax. No acute  osseous abnormalities are seen. IMPRESSION: Unchanged cardiomegaly. Unchanged pulmonary edema. Probable small bilateral pleural effusions. Findings consistent with CHF. Electronically Signed   By: Keith Rake M.D.   On: 09/09/2020 20:10    Medications:  . amLODipine  10 mg Oral Daily  . aspirin EC  81 mg Oral Daily  . atorvastatin  20 mg Oral Daily  . Chlorhexidine Gluconate Cloth  6 each Topical Q0600  . darbepoetin (ARANESP) injection - DIALYSIS  100 mcg Intravenous Q Mon-HD  . fluticasone furoate-vilanterol  1 puff Inhalation Daily  . gabapentin  300 mg Oral QHS  . heparin  5,000 Units Subcutaneous Q8H  . hydrALAZINE  50 mg Oral TID  . metoprolol tartrate  100 mg Oral BID  . multivitamin  1 tablet Oral Daily  . sevelamer carbonate  800 mg Oral TID WC

## 2020-09-11 NOTE — Consult Note (Signed)
Chief Complaint: Patient was seen in consultation today for left low arm dialysis fistulagram with possible intervention Chief Complaint  Patient presents with  . Shortness of Breath    Missed Dialysis   at the request of Dr Mickel Crow   Supervising Physician: Jacqulynn Cadet  Patient Status: [facility] - In-pt  History of Present Illness: Thomas Robinson is a 60 y.o. male   ESRD Admitted 09/09/20 with SOB Covid neg; volume overload Feeling much better today LUE edema Minimal tenderness Dialysis complete 1/9  Renal MD requesting evaluation and possible intervention left arm fistula  Past Medical History:  Diagnosis Date  . Anaphylactic shock, unspecified, sequela 06/10/2019  . Arthritis   . Asthma, chronic, unspecified asthma severity, with acute exacerbation 10/23/2017  . CAP (community acquired pneumonia) 10/23/2017  . Carpal tunnel syndrome of right wrist 10/05/2018  . Cataract    right - removed by surgery  . Cerebral infarction due to thrombosis of cerebral artery ([place])   . Cervical disc herniation 01/04/2020  . Chronic diastolic heart failure ([place]) 04/13/2018  . Chronic low back pain 11/24/2019  . Constipation   . Cough    chronic cough  . Encounter for immunization 07/08/2017  . ESRD on hemodialysis ([place]) 02/16/2018   Dialysis  T Thus Sat  - [facility]   . Fall 06/04/2019  . GERD (gastroesophageal reflux disease) 06/04/2019  . GI bleeding 05/24/2017  . Gram-negative sepsis, unspecified ([place]) 09/05/2017  . History of fusion of cervical spine 03/26/2020  . Hyperlipidemia   . Hypertension   . Hypokalemia 06/04/2017  . Iron deficiency anemia, unspecified 09/09/2017  . Left shoulder pain 11/11/2019  . Lumbar radiculopathy 11/24/2019  . Lung contusion 06/04/2019  . LVH (left ventricular hypertrophy) due to hypertensive disease, with heart failure ([place]) 06/18/2020  . Macrocytic anemia 05/24/2017  . Moderate protein-calorie malnutrition ([place]) 06/06/2017  . Myofascial  pain syndrome 06/12/2020  . Neuritis of right ulnar nerve 09/13/2018  . Non-compliance with renal dialysis ([place]) 02/08/2020  . Oxygen deficiency 12/30/2019   O2 sats on RA 87% at PAT appt   . Pain in joint of right elbow 09/13/2018  . Renovascular hypertension 06/22/2020  . Restless leg syndrome   . Rib fractures 06/2019   Right  . Secondary hyperparathyroidism of renal origin ([place]) 06/04/2017  . Thoracic ascending aortic aneurysm ([facility])    4.5 cm 06/04/19 CT  . Ulnar neuropathy 10/05/2018  . Volume overload 10/23/2017    Past Surgical History:  Procedure Laterality Date  . ANTERIOR CERVICAL DECOMP/DISCECTOMY FUSION N/A 01/04/2020   Procedure: ANTERIOR CERVICAL DECOMPRESSION/DISCECTOMY FUSION CERVICAL FIVE THROUGH SEVEN;  Surgeon: Melina Schools, MD;  Location: [place];  Service: Orthopedics;  Laterality: N/A;  3 hrs  . AV FISTULA PLACEMENT Left 05/28/2017   Procedure: LEFT ARM ARTERIOVENOUS (AV) FISTULA CREATION;  Surgeon: Conrad [place], MD;  Location: [place];  Service: Vascular;  Laterality: Left;  . EYE SURGERY Right 06/02/2019   Cataract removed  . FRACTURE SURGERY    . IR FLUORO GUIDE CV LINE RIGHT  05/25/2017  . IR US GUIDE VASC ACCESS RIGHT  05/25/2017  . REVISON OF ARTERIOVENOUS FISTULA Left 07/18/2019   Procedure: REVISION PLICATION OF RADIOCEPHALIC ARTERIOVENOUS FISTULA LEFT ARM;  Surgeon: Angelia Mould, MD;  Location: [place];  Service: Vascular;  Laterality: Left;  . RINOPLASTY      Allergies: Patient has no known allergies.  Medications: Prior to Admission medications   Medication Sig Start Date End Date  Taking? Authorizing Provider  albuterol (VENTOLIN HFA) 108 (90 Base) MCG/ACT inhaler Inhale 2 puffs into the lungs every 6 (six) hours as needed for wheezing or shortness of breath.    Yes [provider]  amLODipine (NORVASC) 10 MG tablet Take 10 mg by mouth daily.    Yes [provider]  atorvastatin (LIPITOR) 20 MG tablet Take 20 mg by mouth at bedtime.  04/19/20  Yes [provider]  AURYXIA 1 GM 210 MG(Fe) tablet Take 420 mg by mouth 3 (three) times daily with meals.  03/16/20  Yes [provider]  BREO ELLIPTA 100-25 MCG/INH AEPB Inhale 1 puff into the lungs daily. 06/26/20  Yes [provider]  cholecalciferol (VITAMIN D3) 25 MCG (1000 UNIT) tablet Take 1,000 Units by mouth daily.   Yes [provider]  cinacalcet (SENSIPAR) 60 MG tablet Take 60 mg by mouth daily.   Yes [provider]  furosemide (LASIX) 40 MG tablet Take 1 tablet (40 mg total) by mouth See admin instructions. On Sunday, Monday, Wednesday, Thursday and Saturday 08/14/20  Yes Tobb, Kardie, DO  gabapentin (NEURONTIN) 300 MG capsule Take 300 mg by mouth 2 (two) times daily as needed (pain).    Yes [provider]  hydrALAZINE (APRESOLINE) 25 MG tablet Take 50 mg by mouth 3 (three) times daily.   Yes [provider]  metoprolol tartrate (LOPRESSOR) 100 MG tablet Take 100 mg by mouth 2 (two) times daily.    Yes [provider]  multivitamin (RENA-VIT) TABS tablet Take 1 tablet by mouth daily. 01/12/18  Yes [provider]  sevelamer carbonate (RENVELA) 800 MG tablet Take 800 mg by mouth daily. 11/07/19  Yes [provider]  Specialty Vitamins Products (HEALTHY HEART COMPLEX PO) Take 1 tablet by mouth daily.   Yes [provider]     Family History  Problem Relation Age of Onset  . Cancer Mother     Social History   Socioeconomic History  . Marital status: Single    Spouse name: Not on file  . Number of children: Not on file  . Years of education: Not on file  . Highest education level: Not on file  Occupational History  . Not on file  Tobacco Use  . Smoking status: Never Smoker  . Smokeless tobacco: Never Used  Vaping Use  . Vaping Use: Never used  Substance and Sexual Activity  . Alcohol use: Not Currently    Alcohol/week: 0.0 standard drinks    Comment: has a drink  once in a while- 12/30/19  . Drug use: No  . Sexual activity: Not Currently  Other Topics Concern  . Not on file  Social History Narrative   Lives with a roommate in a one story home.  Has 1 child.  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  Education: high school.   Social Determinants of Health   Financial Resource Strain: Not on file  Food Insecurity: Not on file  Transportation Needs: Not on file  Physical Activity: Not on file  Stress: Not on file  Social Connections: Not on file    Review of Systems: A 12 point ROS discussed and pertinent positives are indicated in the HPI above.  All other systems are negative.  Review of Systems  Constitutional: Negative for activity change, fatigue and fever.  Respiratory: Positive for shortness of breath. Negative for cough.   Cardiovascular: Negative for chest pain.  Gastrointestinal: Negative for abdominal pain.  Neurological: Positive  for weakness.  Psychiatric/Behavioral: Negative for behavioral problems and confusion.    Vital Signs: BP 121/78   Pulse 64   Temp 97.8 F (36.6 C) (Oral)   Resp (!) 21   Ht 5\' 8"  (1.727 m)   Wt 143 lb 4.8 oz (65 kg)   SpO2 94%   BMI 21.79 kg/m   Physical Exam Vitals reviewed.  Cardiovascular:     Rate and Rhythm: Normal rate.  Pulmonary:     Effort: Pulmonary effort is normal.     Breath sounds: Normal breath sounds.  Musculoskeletal:        General: Normal range of motion.     Comments: Left forearm fistula +thrill +pulse + edema  Skin:    General: Skin is warm.  Neurological:     Mental Status: He is alert and oriented to person, place, and time.  Psychiatric:        Behavior: Behavior normal.     Imaging: DG Chest Portable 1 View  Result Date: 09/09/2020 CLINICAL DATA:  Hypoxia and shortness of breath. Missed dialysis last week. EXAM: PORTABLE CHEST 1 VIEW COMPARISON:  Most recent radiograph 09/04/2020.  Additional priors. FINDINGS: Similar cardiomegaly. Unchanged mediastinal  contours. Aortic atherosclerosis. Hazy lung base opacities blunting of the costophrenic angles likely small effusions. Peribronchial thickening with features of interstitial edema, similar to prior. No pneumothorax. No acute osseous abnormalities are seen. IMPRESSION: Unchanged cardiomegaly. Unchanged pulmonary edema. Probable small bilateral pleural effusions. Findings consistent with CHF. Electronically Signed   By: Keith Rake M.D.   On: 09/09/2020 20:10   DG Chest Portable 1 View  Result Date: 09/04/2020 CLINICAL DATA:  Dyspnea EXAM: PORTABLE CHEST 1 VIEW COMPARISON:  08/13/2020 FINDINGS: The lungs are symmetrically well expanded. There is central pulmonary vascular congestion. There is superimposed diffuse mild interstitial pulmonary infiltrate possibly relating changes of mild diffuse pulmonary edema. No pneumothorax or pleural effusion. Cardiac size is mildly enlarged, unchanged. No acute bone abnormality. Cervical fusion hardware again noted. IMPRESSION: Central pulmonary vascular engorgement and trace interstitial pulmonary edema most in keeping with mild cardiogenic failure. Stable cardiomegaly. Electronically Signed   By: Fidela Salisbury MD   On: 09/04/2020 04:16   DG Chest Port 1 View  Result Date: 08/13/2020 CLINICAL DATA:  Dyspnea, end-stage renal disease EXAM: PORTABLE CHEST 1 VIEW COMPARISON:  08/05/2020 FINDINGS: The lungs are symmetrically mildly hyperinflated, progressive since prior examination, in keeping with underlying obstructive pulmonary disease. No pneumothorax. Tiny bilateral pleural effusions are present, new since prior examination. There is central pulmonary vascular engorgement and progressive superimposed perihilar pulmonary infiltrate, possibly reflecting progressive edema. Mild cardiomegaly is stable. Cervical fusion hardware again noted. No acute bone abnormality., IMPRESSION: Stable cardiomegaly. Interval development of progressive pulmonary infiltrate and tiny  bilateral pleural effusions possibly reflecting mild superimposed cardiogenic failure. Progressive pulmonary hyperinflation in keeping with obstructive pulmonary disease. Electronically Signed   By: Fidela Salisbury MD   On: 08/13/2020 02:37    Labs:  CBC: Recent Labs    09/04/20 0450 09/09/20 2005 09/09/20 2018 09/10/20 0005 09/10/20 1439  WBC 6.2 5.8  --  5.3 6.1  HGB 7.7* 7.2* 8.8* 7.7* 8.5*  HCT 24.4* 22.1* 26.0* 23.8* 25.8*  PLT 123* 155  --  131* 153    COAGS: Recent Labs    01/04/20 0843  INR 1.1  APTT 36    BMP: Recent Labs    03/21/20 1515 05/22/20 0746 05/23/20 1152 05/30/20 1643 06/06/20 0703 09/04/20 0450 09/09/20 2005 09/09/20 2018 09/10/20 0005  09/10/20 0322 09/10/20 1439  NA 141 142 136 141   < > 140 141 141  --  144 138  K 5.0 5.4* 4.4 5.4*   < > 6.3* 6.1* 6.0*  --  6.1* 3.9  CL 96 99 97* 103   < > 102 102 108  --  103 95*  CO2 25 26 22  20*   < > 20* 17*  --   --  17* 24  GLUCOSE 88 101* 85 130*   < > 93 100* 89  --  105* 111*  BUN 53* 55* 59* 98*   < > 111* 136* >130*  --  136* 41*  CALCIUM 10.0 10.8* 10.0 10.8*   < > 9.5 9.9  --   --  10.1 10.1  CREATININE 7.69* 9.98* 10.51* 13.52*   < > 10.55* 12.08* 11.90* 11.96* 11.97* 5.43*  GFRNONAA 7* 5* 5* 3*   < > 5* 4*  --  4* 4* 11*  GFRAA 8* 6* 5* 4*  --   --   --   --   --   --   --    < > = values in this interval not displayed.    LIVER FUNCTION TESTS: Recent Labs    06/06/20 0747 06/15/20 0602 07/10/20 0604 07/16/20 0343 08/06/20 0359 09/09/20 2005 09/10/20 0322  BILITOT 1.1 0.8 0.7  --   --  1.0  --   AST 9* 14* 8*  --   --  10*  --   ALT 8 16 11   --   --  17  --   ALKPHOS 62 71 74  --   --  69  --   PROT 6.4* 6.5 6.3*  --   --  6.7  --   ALBUMIN 3.7 3.8 3.6 3.9 3.6 3.6 3.4*    TUMOR MARKERS: No results for input(s): AFPTM, CEA, CA199, CHROMGRNA in the last 8760 hours.  Assessment and Plan:  ESRD LUE dialysis fistula +edema +pulse and +thrill Scheduled for fistula  evaluation and possible intervention in IR 1/11 Pt is aware of procedure benefits and risks Including but not limited to Infection; bleeding; damage to structure Agreeable to proceed Consent signed and in chart  Thank you for this interesting consult.  I greatly enjoyed meeting Herman Fiero and look forward to participating in their care.  A copy of this report was sent to the requesting provider on this date.  Electronically Signed: Lavonia Drafts, PA-C 09/11/2020, 1:32 PM   I spent a total of 20 Minutes    in face to face in clinical consultation, greater than 50% of which was counseling/coordinating care for LUE fistulogram

## 2020-09-11 NOTE — Evaluation (Signed)
Occupational Therapy Evaluation Patient Details Name: Thomas Robinson MRN: [id number] DOB: Dec 02, 1960 Today's Date: 09/11/2020    History of Present Illness 60 yo admitted 1/9 with SOB with acute hypoxic respiratory failure due to pulmonary edema with hyperkalemia from missed HD sessions. PMhx: ESRD on HD, CHF, HTN, HLD, CVA, GIB, ACDF C5-7, asthma, restless leg syndrome, ulnar neuropathy   Clinical Impression   PTA, pt lives with friends and reports complete Independence without use of AD. Pt does endorse history of falls secondary to spinal issues and subsequent balance deficits. Pt presents now requiring min guard for mobility due to balance deficits and Supervision for LB ADLs to ensure safety secondary to desats. Pt received on 2 L O2 at 94%, trialed mobility in room on RA with desats to 73%. Pt with no SOB and reports feeling well enough to walk halls. Educated pt on impact of rapid desats and guided pt in seated rest break and replacement of supplemental O2, recovering to 88% > 2 minutes. Provided energy conservation handout, as well as HEP to increase strength. Pt would benefit from continued education of monitoring O2 during ADLs/mobility at acute level to ensure safety at home, especially as pt asymptomatic with desats. Recommend use of BSC as shower chair. Anticipate no skilled OT services needed on discharge - will continue to monitor and update as appropriate.     Follow Up Recommendations  No OT follow up;Supervision - Intermittent    Equipment Recommendations  3 in 1 bedside commode    Recommendations for Other Services       Precautions / Restrictions Precautions Precautions: Fall Precaution Comments: monitor O2 Restrictions Weight Bearing Restrictions: No      Mobility Bed Mobility Overal bed mobility: Modified Independent                  Transfers Overall transfer level: Needs assistance Equipment used: None Transfers: Sit to/from Stand Sit to Stand:  Min guard         General transfer comment: min guard to supervision for sit to stand transfers without AD. Pt requires min guard for dynamic tasks/mobility due to unsteadiness    Balance Overall balance assessment: Needs assistance;History of Falls   Sitting balance-Leahy Scale: Good Sitting balance - Comments: pt able to sit EOB without physical assist   Standing balance support: No upper extremity supported;During functional activity Standing balance-Leahy Scale: Good Standing balance comment: fair static standing, but unsteadiness noted without UE support during dynamic tasks                           ADL either performed or assessed with clinical judgement   ADL Overall ADL's : Needs assistance/impaired Eating/Feeding: Independent;Sitting   Grooming: Supervision/safety;Standing   Upper Body Bathing: Independent;Sitting   Lower Body Bathing: Supervison/ safety;Sit to/from stand Lower Body Bathing Details (indicate cue type and reason): Encouraged use of shower chair for task Upper Body Dressing : Independent;Sitting   Lower Body Dressing: Supervision/safety;Sit to/from stand Lower Body Dressing Details (indicate cue type and reason): Educated on sitting for task as pt bends to feet in standing Toilet Transfer: Min guard;Ambulation;Regular Toilet   Toileting- Water quality scientist and Hygiene: Independent;Sit to/from stand       Functional mobility during ADLs: Min guard General ADL Comments: Pt limited by decreased awareness of desats, decreased dynamic standing balance     Vision Patient Visual Report: No change from baseline Vision Assessment?: No apparent visual deficits  Perception     Praxis      Pertinent Vitals/Pain Pain Assessment: No/denies pain Pain Score: 8  Pain Location: back Pain Descriptors / Indicators: Aching;Constant Pain Intervention(s): Limited activity within patient's tolerance;Monitored during session;Repositioned      Hand Dominance Right   Extremity/Trunk Assessment Upper Extremity Assessment Upper Extremity Assessment: Generalized weakness (L UE swollen from needle stick on dialysis UE)   Lower Extremity Assessment Lower Extremity Assessment: Defer to PT evaluation RLE Deficits / Details: hip flexion 3/5, knee extension 4/5, knee flexion 5/5 LLE Deficits / Details: hip flexion 3/5, knee extension 4/5, knee flexion 5/5   Cervical / Trunk Assessment Cervical / Trunk Assessment: Normal   Communication Communication Communication: No difficulties   Cognition Arousal/Alertness: Awake/alert Behavior During Therapy: Impulsive Overall Cognitive Status: Impaired/Different from baseline Area of Impairment: Safety/judgement                         Safety/Judgement: Decreased awareness of safety;Decreased awareness of deficits     General Comments: Pt noncompliant with use of O2 - even with sats dropping on trial of RA, pt insistent on ability to ambulate in hallway any. Able to be redirected with education of safety reasoning. Educated on use of DME (ex. shower chair) to ensure safety due to desats but pt reports he showers fine   General Comments  Pt on 2 L O2 on entry, SpO2 at 94% at rest. Trialed short mobility in room on RA with desats to 73% (pt denies any SOB). Extended time needed to convince pt to sit for rest break and reapplication of O2 with >2 minutes needed to recover to 88%. Provided energy conservation handout with encouragement to consider using shower chair for tasks, obtaining pulse ox to monitor numbers at home due to asymptomatic. Provided UE HEP with additional LE exercises, therband to improve strength as pt with desire to stay active. Pt also reports his balance deficits are from need to have back surgery - endorses falls at home    Exercises General Exercises - Lower Extremity Hip Flexion/Marching: AROM;Both;Seated;10 reps   Shoulder Instructions      Home  Living Family/patient expects to be discharged to:: Private residence Living Arrangements: Non-relatives/Friends Available Help at Discharge: Family;Available 24 hours/day (friend he has lived with for 12+ years) Type of Home: House Home Access: Stairs to enter [employer] of Steps: 2   Home Layout: One level;Laundry or work area in basement     [employer] Shower/Tub: Teacher, early years/pre: [employer]: None          Prior Functioning/Environment Level of Independence: Independent        Comments: Independent with all ADLs, IADLs. Drives self to dialysis        OT Problem List: Decreased strength;Decreased activity tolerance;Impaired balance (sitting and/or standing);Decreased safety awareness;Cardiopulmonary status limiting activity      OT Treatment/Interventions: Self-care/ADL training;Therapeutic exercise;Energy conservation;DME and/or AE instruction;Therapeutic activities;Patient/family education;Balance training    OT Goals(Current goals can be found in the care plan section) Acute Rehab OT Goals Patient Stated Goal: go home tomorrow, have back surgery in future, be able to get back to gym OT Goal Formulation: With patient Time For Goal Achievement: 09/25/20 Potential to Achieve Goals: Good ADL Goals Pt Will Transfer to Toilet: with modified independence;ambulating;regular height toilet Pt/caregiver will Perform Home Exercise Program: Increased strength;Both right and left upper extremity;With theraband;Independently;With written HEP provided Additional ADL Goal #1:  Pt to verbalize at least 3 energy conservation strategies to implement during ADLs/IADLs Additional ADL Goal #2: Pt to demonstrate ability to monitor SpO2 Independently and implement pursed lip breathing to maintain >88% Additional ADL Goal #3: Pt to improve activity tolerance to > 3 minutes during ADLs without desats < 88%.  OT Frequency: Min 2X/week   Barriers to  D/C:            Co-evaluation              AM-PAC OT "6 Clicks" Daily Activity     Outcome Measure Help from another person eating meals?: None Help from another person taking care of personal grooming?: A Little Help from another person toileting, which includes using toliet, bedpan, or urinal?: A Little Help from another person bathing (including washing, rinsing, drying)?: A Little Help from another person to put on and taking off regular upper body clothing?: None Help from another person to put on and taking off regular lower body clothing?: A Little 6 Click Score: 20   End of Session Equipment Utilized During Treatment: Gait belt;Oxygen Nurse Communication: Mobility status;Other (comment) (O2)  Activity Tolerance: Treatment limited secondary to medical complications (Comment) Patient left: in bed;with call bell/phone within reach;with bed alarm set  OT Visit Diagnosis: Unsteadiness on feet (R26.81);Other abnormalities of gait and mobility (R26.89);Other (comment) (decreased cardiopulmonary tolerance)                Time: 3748-2707 OT Time Calculation (min): 26 min Charges:  OT General Charges $OT Visit: 1 Visit OT Evaluation $OT Eval Low Complexity: 1 Low OT Treatments $Self Care/Home Management : 8-22 mins  Layla Maw, OTR/L  Layla Maw 09/11/2020, 12:57 PM
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
## 2020-09-11 NOTE — Progress Notes (Signed)
Pt. Has been refusing his heparin.    Also I notified Dr. [place] Crane, that  the pt. left arm is more swollen than the right. It is elevated now, it feels tighter and warmer than the arm on the right but no complaints of pain. Asked about ordering an ultra sound.

## 2020-09-11 NOTE — Progress Notes (Signed)
Notified Dr. Nevada Crane, pt is now refusing to wear oxygen and SAT in 80's. Pt. Is being Non-compliant.

## 2020-09-11 NOTE — Progress Notes (Addendum)
pt. says he needs clothes because "come hell, or high water I'm leaving at 12 tomorrow"  He's also complaining that his arm is acting funny and something needs to be done now. I told him the Dr. And I  talked and ultrasound is coming. He made a joke? Saying that he just smoked a joint in the bathroom. I don't smell anything at all. But he's acting more needy and odd from this morning but is A&O x4.   To me his arm looks the same as before. pt. Is saying that the arm is getting bigger and bigger by the second. Still same as when Dr. Nevada Crane was first messaged about the arm.

## 2020-09-11 NOTE — Progress Notes (Signed)
Initial Nutrition Assessment  DOCUMENTATION CODES:   Not applicable  INTERVENTION:    Nepro Shake po BID, each supplement provides 425 kcal and 19 grams protein  30 ml ProSource Plus BID, each supplement provides 100 kcals and 15 grams protein.   Renal MVI daily   NUTRITION DIAGNOSIS:   Increased nutrient needs related to acute illness as evidenced by estimated needs.  GOAL:   Patient will meet greater than or equal to 90% of their needs  MONITOR:   PO intake,Supplement acceptance,Weight trends,Labs,I & O's  REASON FOR ASSESSMENT:   Malnutrition Screening Tool    ASSESSMENT:   Patient with PMH significant for ESRD on HD (has been discharged from local clinics, last HD on 1/4), HLD, and HTN. Presents this admission with acute pulmonary edema 2/2 to missing HD.   Plan L fistula evaluation with possible intervention today.   Unable to obtain nutrition history at this time. No meal completions documented. RD to provide supplementation to maximize kcal and protein this admission.    Patient currently does not have outpatient HD orders. Unable to obtain EDW. Need actual admission weight to assess for weight loss. Pt has history of malnutrition, but unable to diagnose with NFPE/history.   Medications: aranesp, renvela Labs: Phosphorus 7.3 (H) CBG 89-111  Diet Order:   Diet Order            Diet NPO time specified Except for: Sips with Meds  Diet effective midnight           Diet renal with fluid restriction Fluid restriction: 1200 mL Fluid; Room service appropriate? Yes; Fluid consistency: Thin  Diet effective now                 EDUCATION NEEDS:   Not appropriate for education at this time  Skin:  Skin Assessment: Reviewed RN Assessment  Last BM:  1/10  Height:   Ht Readings from Last 1 Encounters:  09/09/20 5\' 8"  (1.727 m)    Weight:   Wt Readings from Last 1 Encounters:  09/04/20 60.6 kg    BMI:  Body mass index is 21.79 kg/m.  Estimated  Nutritional Needs:   Kcal:  2000-2200 kcal  Protein:  100-120 grams  Fluid:  1000 ml + UOP  Mariana Single RD, LDN Clinical Nutrition Pager listed in [place]

## 2020-09-11 NOTE — TOC Initial Note (Signed)
Transition of Care [facility]) - Initial/Assessment Note    Patient Details  Name: Thomas Robinson MRN: [id number] Date of Birth: Apr 27, 1961  Transition of Care [facility]) CM/SW Contact:    Joanne Chars, LCSW Phone Number: 09/11/2020, 1:55 PM  Clinical Narrative:  CSW spoke with pt regarding discharge plan.  Pt declines offer of oupatient PT, states he is "going back to the gym" instead.  Pt asks about being set up for dialysis and CSW conveyed the information from the renal navigator regarding no facility seat available and pt need to continue presenting to ED for dialysis.  Permission given to speak with friend, Court Joy.  Pt reports he does not have any equipment needs.  Readmission risk items: PCP in place, pt has his own vehicle and drives to his appointments.                   Expected Discharge Plan: Home/Self Care Barriers to Discharge: No Barriers Identified   Patient Goals and CMS Choice Patient states their goals for this hospitalization and ongoing recovery are:: "get back to 100%" CMS Medicare.gov Compare Post Acute Care list provided to::  (na) Choice offered to / list presented to : NA  Expected Discharge Plan and Services Expected Discharge Plan: Home/Self Care In-house Referral: Clinical Social Work   Post Acute Care Choice: NA Living arrangements for the past 2 months: [place]: NA [place] Agency: NA        Prior Living Arrangements/Services Living arrangements for the past 2 months: [place] with:: Friends Patient language and need for interpreter reviewed:: Yes Do you feel safe going back to the place where you live?: Yes      Need for Family Participation in Patient Care: No (Comment) Care giver support system in place?: Yes (comment) Current home services: Other (comment) (none) Criminal Activity/Legal Involvement Pertinent to Current Situation/Hospitalization: No - Comment as  needed  Activities of Daily Living      Permission Sought/Granted Permission sought to share information with : Family Supports Permission granted to share information with : Yes, Verbal Permission Granted  Share Information with NAME: Court Joy, friend           Emotional Assessment Appearance:: Appears stated age Attitude/Demeanor/Rapport: Engaged Affect (typically observed): Appropriate,Pleasant Orientation: : Oriented to Self,Oriented to Place,Oriented to  Time,Oriented to Situation Alcohol / Substance Use: Not Applicable Psych Involvement: No (comment)  Admission diagnosis:  Acute pulmonary edema (HCC) [J81.0] SOB (shortness of breath) [R06.02] Hypoxia [R09.02] Hypervolemia, unspecified hypervolemia type [E87.70] Patient Active Problem List   Diagnosis Date Noted  . Hyperkalemia 08/05/2020  . Arthritis   . Asthma   . Cataract   . Cerebral infarction due to thrombosis of cerebral artery ([place])   . History of CVA (cerebrovascular accident)   . Chronic kidney disease   . Dyspnea   . Hyperlipidemia   . Hypertension   . Kidney failure   . Restless leg syndrome   . Degenerative lumbar spinal stenosis 06/26/2020  . Diastolic dysfunction 74/94/4967  . Renovascular hypertension 06/22/2020  . Demand ischemia ([place]) 06/18/2020  . LVH (left ventricular hypertrophy) due to hypertensive disease, with heart failure ([place]) 06/18/2020  . Myofascial pain syndrome 06/12/2020  . Encounter for removal of sutures 03/29/2020  . History of fusion of cervical spine 03/26/2020  .  Non-compliance with renal dialysis ([place]) 02/08/2020  . Acute pulmonary edema ([place]) 01/23/2020  . Cervical disc herniation 01/04/2020  . Tachycardia 01/04/2020  . SOB (shortness of breath)   . Oxygen deficiency 12/30/2019  . Chronic low back pain 11/24/2019  . Degeneration of lumbar intervertebral disc 11/24/2019  . Lumbar radiculopathy 11/24/2019  . Left shoulder pain 11/11/2019  . Anaphylactic shock,  unspecified, sequela 06/10/2019  . Rib fractures 06/05/2019  . Fall at home, initial encounter 06/04/2019  . Right rib fracture 06/04/2019  . Lung contusion 06/04/2019  . Acute respiratory failure with hypoxia ([place]) 06/04/2019  . Anemia in ESRD (end-stage renal disease) ([place]) 06/04/2019  . GERD (gastroesophageal reflux disease) 06/04/2019  . Chronic diastolic (congestive) heart failure ([place]) 06/04/2019  . Fall 06/04/2019  . Thoracic ascending aortic aneurysm ([place]) 06/04/2019  . Acute exacerbation of congestive heart failure ([place]) 05/16/2019  . Carpal tunnel syndrome of right wrist 10/05/2018  . Ulnar neuropathy 10/05/2018  . Neuritis of right ulnar nerve 09/13/2018  . Pain in joint of right elbow 09/13/2018  . Chest pain 04/13/2018  . Chronic diastolic heart failure ([place]) 04/13/2018  . Acute on chronic diastolic CHF (congestive heart failure) ([place]) 04/13/2018  . Chest discomfort   . Atypical chest pain 02/16/2018  . ESRD on dialysis ([place]) 02/16/2018  . ESRD (end stage renal disease) on dialysis ([place]) 02/16/2018  . Volume overload 10/23/2017  . CAP (community acquired pneumonia) 10/23/2017  . Asthma, chronic, unspecified asthma severity, with acute exacerbation 10/23/2017  . Respiratory failure, acute ([place]) 10/23/2017  . Iron deficiency anemia, unspecified 09/09/2017  . Gram-negative sepsis, unspecified ([place]) 09/05/2017  . Cough 08/23/2017  . Encounter for immunization 07/08/2017  . Moderate protein-calorie malnutrition ([place]) 06/06/2017  . Coagulation defect, unspecified ([place]) 06/04/2017  . Hypokalemia 06/04/2017  . Other hyperlipidemia 06/04/2017  . Secondary hyperparathyroidism of renal origin ([place]) 06/04/2017  . Constipation   . Macrocytic anemia 05/24/2017  . Uremia 05/24/2017  . Hypertensive urgency 05/24/2017  . Acute kidney injury superimposed on CKD ([place]) 05/24/2017  . CKD (chronic kidney disease), stage V ([place]) 05/24/2017  . GI bleeding 05/24/2017  . Symptomatic  anemia 05/24/2017  . Stroke ([place]) 2018  . AKI (acute kidney injury) ([place]) 04/10/2015  . History of completed stroke   . Essential hypertension 04/09/2015   PCP:  [name], PA Pharmacy:   [id number] [address], [place] [address] Phone: [phone] Fax: [phone]     Social Determinants of Health (SDOH) Interventions    Readmission Risk Interventions No flowsheet data found.

## 2020-09-11 NOTE — Progress Notes (Signed)
Pt constantly removing nasal cannula from his nose and sats drop as low as 78 on room air. Pt educated on the importance of wearing the oxygen. Pt states, "I know, I'm just not used to wearing this. It's fine, you won't get in trouble, just tell them I took it off." Telemetry made aware that pt periodically takes off oxygen. Pt also unplugged pulse ox from monitor to stop the alarm. Pt educated on importance of medical treatment. Pt in NAD at this time. Will continue to monitor pt.

## 2020-09-11 NOTE — Evaluation (Signed)
Physical Therapy Evaluation Patient Details Name: Thomas Robinson MRN: [id number] DOB: [birth year] Today's Date: 09/11/2020   History of Present Illness  60 yo admitted 1/9 with SOB with acute hypoxic respiratory failure due to pulmonary edema with hyperkalemia from missed HD sessions. PMhx: ESRD on HD, CHF, HTN, HLD, CVA, GIB, ACDF C5-7, asthma, restless leg syndrome, ulnar neuropathy  Clinical Impression  Pt with decreased cognition, balance, function, gait and mobility with admitted noncompliance and lack of understanding of safety deficits with frequent falls. Pt will benefit from acute therapy to maximize mobility, gait and balance to decrease fall risk. Pt on 2L at rest with SpO2 91% and dropped to 87% with gait, on 4L able to maintain 100% with gait. Pt educated for need for oxygen, AD to prevent falls and further therapy. Pt states he would be agreeable to OPPT if he can find HD.      Follow Up Recommendations Outpatient PT    Equipment Recommendations  Rolling walker with 5" wheels (pt states he will refuse)    Recommendations for Other Services       Precautions / Restrictions Precautions Precautions: Fall Precaution Comments: watch sats      Mobility  Bed Mobility Overal bed mobility: Modified Independent                  Transfers Overall transfer level: Needs assistance   Transfers: Sit to/from Stand Sit to Stand: Min guard         General transfer comment: guarding for safety. Pt required 3 attempts to stand prior to being able to maintain standing with 2 LOB back onto bed prior to successfully standing.  Ambulation/Gait Ambulation/Gait assistance: Min guard Gait Distance (Feet): 350 Feet Assistive device:  (pt pulling O2 tank.) Gait Pattern/deviations: Decreased stride length;Step-to pattern;Antalgic   Gait velocity interpretation: >2.62 ft/sec, indicative of community ambulatory General Gait Details: pt walking with left knee in extension,  vaulting and circumducting on left stating he does this due to chronic back pain. 2 LOB during gait with tactile cues to recover  Stairs            Wheelchair Mobility    Modified Rankin (Stroke Patients Only)       Balance Overall balance assessment: Needs assistance;History of Falls   Sitting balance-Leahy Scale: Fair Sitting balance - Comments: pt able to sit EOB without physical assist     Standing balance-Leahy Scale: Fair Standing balance comment: pt required 3 attempts to initial standing and able to walk with O2 tank with noted deficits and LOB, refused AD                             Pertinent Vitals/Pain Pain Assessment: 0-10 Pain Score: 8  Pain Location: back Pain Descriptors / Indicators: Aching;Constant Pain Intervention(s): Limited activity within patient's tolerance;Monitored during session;Repositioned    Home Living Family/patient expects to be discharged to:: Private residence Living Arrangements: Non-relatives/Friends Available Help at Discharge: Family;Available 24 hours/day Type of Home: House Home Access: Stairs to enter   [employer] of Steps: 2 Home Layout: One level;Laundry or work area in [employer]: None      Prior Function Level of Independence: Independent               Journalist, newspaper        Extremity/Trunk Assessment   Upper Extremity Assessment Upper Extremity Assessment: Generalized weakness    Lower Extremity Assessment Lower Extremity  Assessment: RLE deficits/detail;LLE deficits/detail RLE Deficits / Details: hip flexion 3/5, knee extension 4/5, knee flexion 5/5 LLE Deficits / Details: hip flexion 3/5, knee extension 4/5, knee flexion 5/5       Communication   Communication: No difficulties  Cognition Arousal/Alertness: Awake/alert Behavior During Therapy: WFL for tasks assessed/performed Overall Cognitive Status: Impaired/Different from baseline Area of Impairment:  Safety/judgement                         Safety/Judgement: Decreased awareness of safety;Decreased awareness of deficits     General Comments: pt admittedly noncompliant with instruction and medical advice. Pt with report of multiple falls but declined even the idea of cane or RW for increased safety. Attempting to take off O2 and monitor even with education that sats only 90% on 2L at rest      General Comments      Exercises General Exercises - Lower Extremity Hip Flexion/Marching: AROM;Both;Seated;10 reps   Assessment/Plan    PT Assessment Patient needs continued PT services  PT Problem List Decreased mobility;Decreased strength;Decreased safety awareness;Decreased activity tolerance;Decreased cognition;Decreased balance;Decreased knowledge of use of DME;Cardiopulmonary status limiting activity;Pain       PT Treatment Interventions Gait training;Balance training;Functional mobility training;Stair training;Cognitive remediation;Therapeutic activities;Patient/family education;DME instruction;Therapeutic exercise    PT Goals (Current goals can be found in the Care Plan section)  Acute Rehab PT Goals Patient Stated Goal: return home today PT Goal Formulation: With patient Time For Goal Achievement: 09/25/20 Potential to Achieve Goals: Fair    Frequency Min 3X/week   Barriers to discharge Decreased caregiver support      Co-evaluation               AM-PAC PT "6 Clicks" Mobility  Outcome Measure Help needed turning from your back to your side while in a flat bed without using bedrails?: None Help needed moving from lying on your back to sitting on the side of a flat bed without using bedrails?: None Help needed moving to and from a bed to a chair (including a wheelchair)?: A Little Help needed standing up from a chair using your arms (e.g., wheelchair or bedside chair)?: A Little Help needed to walk in hospital room?: A Little Help needed climbing 3-5 steps  with a railing? : A Little 6 Click Score: 20    End of Session Equipment Utilized During Treatment: Gait belt;Oxygen Activity Tolerance: Patient tolerated treatment well Patient left: in chair;with call bell/phone within reach Nurse Communication: Mobility status PT Visit Diagnosis: Other abnormalities of gait and mobility (R26.89)    Time: 9629-5284 PT Time Calculation (min) (ACUTE ONLY): 24 min   Charges:   PT Evaluation $PT Eval Moderate Complexity: 1 Mod PT Treatments $Gait Training: 8-22 mins        Thomas Robinson, PT Acute Rehabilitation Services Pager: [phone] Office: [phone]   Thomas Robinson 09/11/2020, 9:23 AM

## 2020-09-12 ENCOUNTER — Inpatient Hospital Stay (HOSPITAL_COMMUNITY): Payer: Medicare Other

## 2020-09-12 ENCOUNTER — Encounter (HOSPITAL_COMMUNITY): Payer: Self-pay | Admitting: Internal Medicine

## 2020-09-12 ENCOUNTER — Encounter (HOSPITAL_COMMUNITY): Payer: Medicare Other

## 2020-09-12 ENCOUNTER — Telehealth: Payer: Self-pay | Admitting: Cardiology

## 2020-09-12 HISTORY — PX: IR DIALY SHUNT INTRO NEEDLE/INTRACATH INITIAL W/IMG LEFT: IMG6102

## 2020-09-12 HISTORY — PX: IR US GUIDE VASC ACCESS LEFT: IMG2389

## 2020-09-12 MED ORDER — IOHEXOL 300 MG/ML  SOLN: 50.0000 mL | Freq: Once | INTRAMUSCULAR | Status: DC | PRN

## 2020-09-12 MED ORDER — LIDOCAINE-EPINEPHRINE 1 %-1:100000 IJ SOLN
INTRAMUSCULAR | Status: AC | PRN
  Administered 2020-09-12: 20 mL via INTRADERMAL

## 2020-09-12 MED ORDER — FENTANYL CITRATE (PF) 100 MCG/2ML IJ SOLN
INTRAMUSCULAR | Status: AC
  Filled 2020-09-12: qty 2

## 2020-09-12 MED ORDER — LIDOCAINE-EPINEPHRINE 1 %-1:100000 IJ SOLN
INTRAMUSCULAR | Status: AC
  Filled 2020-09-12: qty 1

## 2020-09-12 MED ORDER — IOHEXOL 300 MG/ML  SOLN
50.0000 mL | Freq: Once | INTRAMUSCULAR | Status: AC | PRN
  Administered 2020-09-12: 40 mL

## 2020-09-12 MED ORDER — MIDAZOLAM HCL 2 MG/2ML IJ SOLN
INTRAMUSCULAR | Status: AC
  Filled 2020-09-12: qty 2

## 2020-09-12 NOTE — TOC Transition Note (Signed)
Transition of Care [facility]) - CM/SW Discharge Note   Patient Details  Name: Thomas Robinson MRN: [id number] Date of Birth: 1961/08/24  [facility] CM/SW Contact:  Joanne Chars, LCSW Phone Number: 09/12/2020, 1:59 PM   Clinical Narrative:   Pt discharging home today.  Pt reports has own transportation.  Pt declined outpt PT, no equipment needs.  Pt aware of need to present to ED for dialysis moving forward.      Final next level of care: Home/Self Care Barriers to Discharge: No Barriers Identified   Patient Goals and CMS Choice Patient states their goals for this hospitalization and ongoing recovery are:: "get back to 100%" CMS Medicare.gov Compare Post Acute Care list provided to::  (na) Choice offered to / list presented to : NA  Discharge Placement                       Discharge Plan and Services In-house Referral: Clinical Social Work   Post Acute Care Choice: NA                    HH Arranged: NA HH Agency: NA        Social Determinants of Health (SDOH) Interventions     Readmission Risk Interventions Readmission Risk Prevention Plan 09/11/2020  Transportation Screening Complete  PCP or Specialist appointment within 3-5 days of discharge Not Complete  PCP/Specialist Appt Not Complete comments first available appt is 09/26/20  [place] or [place] (No Data)  SW Recovery Care/Counseling Consult Complete  Palliative Care Screening Not [place] Not Applicable  Some recent data might be hidden

## 2020-09-12 NOTE — Procedures (Signed)
Interventional Radiology Procedure Note  Procedure: Left upper extremity fistulagram  Findings: Please refer to procedural dictation for full description.  Patent left radiocephalic fistula from anastomosis through puncture zone, cephalic vein outflow, and central veins.  No intervention indicated.  Complications: None immediate  Estimated Blood Loss: < 5 mL  Recommendations: Fistula OK for use as needed.  Recommend forearm puncture as opposed to upper arm.   Ruthann Cancer, MD

## 2020-09-12 NOTE — Telephone Encounter (Signed)
Patient needs surgical clearance from Dr. Harriet Masson sent to Dr. Tonita Cong. Patient said Dr. Tonita Cong has been waiting for a fax from Dr. Harriet Masson, but Dr. Tonita Cong has not gotten anyting from Dr. Harriet Masson yet.  Patient would like Dr. Terrial Rhodes office to contact Dr. Tonita Cong at [phone]

## 2020-09-12 NOTE — Telephone Encounter (Signed)
I am not aware of his surgery. I would also need a official request for clearance for the Dr to understand what procedure he will be undergoing once I get information from the Dr about the procedure.

## 2020-09-12 NOTE — Discharge Summary (Signed)
Physician Discharge Summary  Thomas Robinson AST:[id number] DOB: 12-29-1960 DOA: 09/09/2020  PCP: [name], PA  Admit date: 09/09/2020 Discharge date: 09/12/2020  Admitted From: Home Disposition:  Left Against Medical Advice before being re-evaluated  Brief/Interim Summary: 60 y.o.malewithhistory of ESRD on hemodialysis who has been discharged from local clinics due to behavioral issues, comes to the ED frequently to get his hemodialysis.  Last hemodialysis prior to this presentation was on September 04, 2020. Pt presented with sob with Nephrology consulted   Discharge Diagnoses:  Principal Problem:   Acute pulmonary edema ([place]) Active Problems:   Hypertensive urgency   ESRD on dialysis ([place])   Anemia in ESRD (end-stage renal disease) ([place])   Acute pulmonary edema secondary to volume overload from missing hemodialysis Was initially placed on BiPAP due to severe hypoxemia with O2 saturation in the 70s. Hemodialysis on 09/10/2020, reportedly tolerated well.  Resolved hyperkalemia with EKG changes secondary to missing hemodialysis sessions Presented with serum potassium 6.3>> 3.9. He has received 1 dose of calcium gluconate, Lokelma and sodium bicarbonate in the ED. Was hemodialyzed on 09/10/2020, reportedly tolerated well.  Improving acute hypoxic respiratory failure secondary to volume overload/pulmonary edema Note to be on 2 L with O2 saturation in the mid 90s.    Improving volume overload secondary to missing hemodialysis Left upper extremity and bilateral lower extremity edema noted on exam Volume status addressed with hemodialysis Appreciate nephrology's assistance  Acute on chronic diastolic CHF in the setting of volume overload Last 2D echo done on 05/17/2019 showed LVEF greater than 65 Volume status was addressed with hemodialysis  Elevated troponin secondary to volume overload Troponin peaked at 212 Nonspecific ST-T changes on clinical EKG noted  Resolved  prolonged QTC post hemodialysis Twelve-lead EKG done on 09/10/2020 with QTC 512 Avoid QTC prolonging agent  Resolving high anion gap metallic acidosis in the setting of missing hemodialysis Presented with serum bicarb of 17 with anion gap of 24 Received sodium bicarb in the ED and was hemodialyzed urgently.  Physical debility PT OT to assess, recommended outpatient PT Continue fall precautions TOC consulted for possible SNF placement, however patient left against medical advice before I could see or examine patient today  Anemia of chronic disease in the setting of ESRD Presented with hemoglobin 7.2>> 8.5 post hemodialysis   Discharge Instructions     Follow-up Information    [place], [place]. Go on 09/26/2020.   Specialty: Physician Assistant Why: Please attend your hospital discharge appointment with [name] PA on Wednesday, 09/26/20, at 1130am.  Contact information: [address] [place] [phone]              No Known Allergies  Consultations:  Nephrology  Procedures/Studies: IR US Guide Vasc Access Left  Result Date: 09/12/2020 INDICATION: 60 year old male with left radiocephalic fistula created in 2018 status post revision in 2020 presenting with left arm swelling. EXAM: DIALYSIS AV FISTULAGRAM COMPARISON:  None. MEDICATIONS: None. CONTRAST:  44mL OMNIPAQUE IOHEXOL 300 MG/ML  SOLN ANESTHESIA/SEDATION: None. FLUOROSCOPY TIME:  0 minutes.  Eighteen seconds.  Ten mGy COMPLICATIONS: None immediate. PROCEDURE: The left arm dialysis graft was prepped with ChloraPrep in a sterile fashion, and a sterile drape was applied covering the operative field. A diagnostic shunt study was performed via 5 French micropuncture sheath introduced into venous outflow. Venous drainage was assessed to the level of the central veins in the chest. Proximal shunt was studied by reflux maneuver with temporary compression of venous outflow.  The angiocath was removed and hemostasis was achieved with manual compression. A dressing was placed. The patient tolerated the procedure well without immediate post procedural complication. FINDINGS: Widely patent and briskly flowing left radiocephalic fistula from the anastomosis through the puncture zone, venous outflow, and central veins. IMPRESSION: Widely patent left radiocephalic arteriovenous fistula. No intervention performed. ACCESS: This access remains amenable to future percutaneous interventions as clinically indicated. Ruthann Cancer, MD [facility] Radiology Electronically Signed   By: Ruthann Cancer MD   On: 09/12/2020 12:04   DG Chest Portable 1 View  Result Date: 09/09/2020 CLINICAL DATA:  Hypoxia and shortness of breath. Missed dialysis last week. EXAM: PORTABLE CHEST 1 VIEW COMPARISON:  Most recent radiograph 09/04/2020.  Additional priors. FINDINGS: Similar cardiomegaly. Unchanged mediastinal contours. Aortic atherosclerosis. Hazy lung base opacities blunting of the costophrenic angles likely small effusions. Peribronchial thickening with features of interstitial edema, similar to prior. No pneumothorax. No acute osseous abnormalities are seen. IMPRESSION: Unchanged cardiomegaly. Unchanged pulmonary edema. Probable small bilateral pleural effusions. Findings consistent with CHF. Electronically Signed   By: Keith Rake M.D.   On: 09/09/2020 20:10   DG Chest Portable 1 View  Result Date: 09/04/2020 CLINICAL DATA:  Dyspnea EXAM: PORTABLE CHEST 1 VIEW COMPARISON:  08/13/2020 FINDINGS: The lungs are symmetrically well expanded. There is central pulmonary vascular congestion. There is superimposed diffuse mild interstitial pulmonary infiltrate possibly relating changes of mild diffuse pulmonary edema. No pneumothorax or pleural effusion. Cardiac size is mildly enlarged, unchanged. No acute bone abnormality. Cervical fusion hardware again noted.  IMPRESSION: Central pulmonary vascular engorgement and trace interstitial pulmonary edema most in keeping with mild cardiogenic failure. Stable cardiomegaly. Electronically Signed   By: Fidela Salisbury MD   On: 09/04/2020 04:16   IR DIALY SHUNT INTRO NEEDLE/INTRACATH INITIAL W/IMG LEFT  Result Date: 09/12/2020 INDICATION: 60 year old male with left radiocephalic fistula created in 2018 status post revision in 2020 presenting with left arm swelling. EXAM: DIALYSIS AV FISTULAGRAM COMPARISON:  None. MEDICATIONS: None. CONTRAST:  32mL OMNIPAQUE IOHEXOL 300 MG/ML  SOLN ANESTHESIA/SEDATION: None. FLUOROSCOPY TIME:  0 minutes.  Eighteen seconds.  Ten mGy COMPLICATIONS: None immediate. PROCEDURE: The left arm dialysis graft was prepped with ChloraPrep in a sterile fashion, and a sterile drape was applied covering the operative field. A diagnostic shunt study was performed via 5 French micropuncture sheath introduced into venous outflow. Venous drainage was assessed to the level of the central veins in the chest. Proximal shunt was studied by reflux maneuver with temporary compression of venous outflow. The angiocath was removed and hemostasis was achieved with manual compression. A dressing was placed. The patient tolerated the procedure well without immediate post procedural complication. FINDINGS: Widely patent and briskly flowing left radiocephalic fistula from the anastomosis through the puncture zone, venous outflow, and central veins. IMPRESSION: Widely patent left radiocephalic arteriovenous fistula. No intervention performed. ACCESS: This access remains amenable to future percutaneous interventions as clinically indicated. Ruthann Cancer, MD [facility] Radiology Electronically Signed   By: Ruthann Cancer MD   On: 09/12/2020 12:04     Subjective: Did not obtain. Pt left before he could be seen or examined  Discharge Exam: Vitals:   09/12/20 0753 09/12/20 1055   BP: (!) 160/70 (!) 150/71  Pulse: 70 (!) 57  Resp: 15 17  Temp: 98.2 F (36.8 C)   SpO2: 92% 92%   Vitals:   09/12/20 0326 09/12/20 0739 09/12/20 0753 09/12/20 1055  BP: [name])  158/67  (!) 160/70 (!) 150/71  Pulse: 61 (!) 58 70 (!) 57  Resp: 16 20 15 17   Temp: 98.4 F (36.9 C)  98.2 F (36.8 C)   TempSrc: Oral  Oral   SpO2: 97% 96% 92% 92%  Weight:      Height:        General: Pt is alert, awake, not in acute distress Cardiovascular: RRR, S1/S2 +, no rubs, no gallops Respiratory: CTA bilaterally, no wheezing, no rhonchi Abdominal: Soft, NT, ND, bowel sounds + Extremities: no edema, no cyanosis   The results of significant diagnostics from this hospitalization (including imaging, microbiology, ancillary and laboratory) are listed below for reference.     Microbiology: Recent Results (from the past 240 hour(s))  Resp Panel by RT-PCR (Flu A&B, Covid) Nasopharyngeal Swab     Status: None   Collection Time: 09/04/20  3:49 AM   Specimen: Nasopharyngeal Swab; Nasopharyngeal(NP) swabs in vial transport medium  Result Value Ref Range Status   SARS Coronavirus 2 by RT PCR NEGATIVE NEGATIVE Final    Comment: (NOTE) SARS-CoV-2 target nucleic acids are NOT DETECTED.  The SARS-CoV-2 RNA is generally detectable in upper respiratory specimens during the acute phase of infection. The lowest concentration of SARS-CoV-2 viral copies this assay can detect is 138 copies/mL. A negative result does not preclude SARS-Cov-2 infection and should not be used as the sole basis for treatment or other patient management decisions. A negative result may occur with  improper specimen collection/handling, submission of specimen other than nasopharyngeal swab, presence of viral mutation(s) within the areas targeted by this assay, and inadequate number of viral copies(<138 copies/mL). A negative result must be combined with clinical observations, patient history, and epidemiological information. The  expected result is Negative.  Fact Sheet for Patients:  EntrepreneurPulse.com.au  Fact Sheet for Healthcare Providers:  IncredibleEmployment.be  This test is no t yet approved or cleared by the [place] FDA and  has been authorized for detection and/or diagnosis of SARS-CoV-2 by FDA under an Emergency Use Authorization (EUA). This EUA will remain  in effect (meaning this test can be used) for the duration of the COVID-19 declaration under Section 564(b)(1) of the Act, 21 U.S.C.section 360bbb-3(b)(1), unless the authorization is terminated  or revoked sooner.       Influenza A by PCR NEGATIVE NEGATIVE Final   Influenza B by PCR NEGATIVE NEGATIVE Final    Comment: (NOTE) The Xpert Xpress SARS-CoV-2/FLU/RSV plus assay is intended as an aid in the diagnosis of influenza from Nasopharyngeal swab specimens and should not be used as a sole basis for treatment. Nasal washings and aspirates are unacceptable for Xpert Xpress SARS-CoV-2/FLU/RSV testing.  Fact Sheet for Patients: EntrepreneurPulse.com.au  Fact Sheet for Healthcare Providers: IncredibleEmployment.be  This test is not yet approved or cleared by the [place] FDA and has been authorized for detection and/or diagnosis of SARS-CoV-2 by FDA under an Emergency Use Authorization (EUA). This EUA will remain in effect (meaning this test can be used) for the duration of the COVID-19 declaration under Section 564(b)(1) of the Act, 21 U.S.C. section 360bbb-3(b)(1), unless the authorization is terminated or revoked.  Performed at [facility], [place] [address], [place], [place] [address]   SARS CORONAVIRUS 2 (TAT 6-24 HRS) Nasopharyngeal Nasopharyngeal Swab     Status: None   Collection Time: 09/09/20  7:31 PM   Specimen: Nasopharyngeal Swab  Result Value Ref Range Status   SARS Coronavirus 2 NEGATIVE NEGATIVE Final  Comment:  (NOTE) SARS-CoV-2 target nucleic acids are NOT DETECTED.  The SARS-CoV-2 RNA is generally detectable in upper and lower respiratory specimens during the acute phase of infection. Negative results do not preclude SARS-CoV-2 infection, do not rule out co-infections with other pathogens, and should not be used as the sole basis for treatment or other patient management decisions. Negative results must be combined with clinical observations, patient history, and epidemiological information. The expected result is Negative.  Fact Sheet for Patients: SugarRoll.be  Fact Sheet for Healthcare Providers: https://www.woods-mathews.com/  This test is not yet approved or cleared by the [place] FDA and  has been authorized for detection and/or diagnosis of SARS-CoV-2 by FDA under an Emergency Use Authorization (EUA). This EUA will remain  in effect (meaning this test can be used) for the duration of the COVID-19 declaration under Se ction 564(b)(1) of the Act, 21 U.S.C. section 360bbb-3(b)(1), unless the authorization is terminated or revoked sooner.  Performed at [facility], [place] [address], [place],  [address]   Resp Panel by RT-PCR (Flu A&B, Covid) Nasopharyngeal Swab     Status: None   Collection Time: 09/10/20  2:37 AM   Specimen: Nasopharyngeal Swab; Nasopharyngeal(NP) swabs in vial transport medium  Result Value Ref Range Status   SARS Coronavirus 2 by RT PCR NEGATIVE NEGATIVE Final    Comment: (NOTE) SARS-CoV-2 target nucleic acids are NOT DETECTED.  The SARS-CoV-2 RNA is generally detectable in upper respiratory specimens during the acute phase of infection. The lowest concentration of SARS-CoV-2 viral copies this assay can detect is 138 copies/mL. A negative result does not preclude SARS-Cov-2 infection and should not be used as the sole basis for treatment or other patient management decisions. A negative result may  occur with  improper specimen collection/handling, submission of specimen other than nasopharyngeal swab, presence of viral mutation(s) within the areas targeted by this assay, and inadequate number of viral copies(<138 copies/mL). A negative result must be combined with clinical observations, patient history, and epidemiological information. The expected result is Negative.  Fact Sheet for Patients:  EntrepreneurPulse.com.au  Fact Sheet for Healthcare Providers:  IncredibleEmployment.be  This test is no t yet approved or cleared by the [place] FDA and  has been authorized for detection and/or diagnosis of SARS-CoV-2 by FDA under an Emergency Use Authorization (EUA). This EUA will remain  in effect (meaning this test can be used) for the duration of the COVID-19 declaration under Section 564(b)(1) of the Act, 21 U.S.C.section 360bbb-3(b)(1), unless the authorization is terminated  or revoked sooner.       Influenza A by PCR NEGATIVE NEGATIVE Final   Influenza B by PCR NEGATIVE NEGATIVE Final    Comment: (NOTE) The Xpert Xpress SARS-CoV-2/FLU/RSV plus assay is intended as an aid in the diagnosis of influenza from Nasopharyngeal swab specimens and should not be used as a sole basis for treatment. Nasal washings and aspirates are unacceptable for Xpert Xpress SARS-CoV-2/FLU/RSV testing.  Fact Sheet for Patients: EntrepreneurPulse.com.au  Fact Sheet for Healthcare Providers: IncredibleEmployment.be  This test is not yet approved or cleared by the [place] FDA and has been authorized for detection and/or diagnosis of SARS-CoV-2 by FDA under an Emergency Use Authorization (EUA). This EUA will remain in effect (meaning this test can be used) for the duration of the COVID-19 declaration under Section 564(b)(1) of the Act, 21 U.S.C. section 360bbb-3(b)(1), unless the authorization is terminated  or revoked.  Performed at [facility], [place]  [address], [place], [place] [address]      Labs: BNP (last 3 results) Recent Labs    12/30/19 1543 01/23/20 1945 02/08/20 1640  BNP >4,500.0* 4,186.6* 2,202.5*   Basic Metabolic Panel: Recent Labs  Lab 09/09/20 2005 09/09/20 2018 09/10/20 0005 09/10/20 0322 09/10/20 1439  NA 141 141  --  144 138  K 6.1* 6.0*  --  6.1* 3.9  CL 102 108  --  103 95*  CO2 17*  --   --  17* 24  GLUCOSE 100* 89  --  105* 111*  BUN 136* >130*  --  136* 41*  CREATININE 12.08* 11.90* 11.96* 11.97* 5.43*  CALCIUM 9.9  --   --  10.1 10.1  PHOS  --   --   --  7.3*  --    Liver Function Tests: Recent Labs  Lab 09/09/20 2005 09/10/20 0322  AST 10*  --   ALT 17  --   ALKPHOS 69  --   BILITOT 1.0  --   PROT 6.7  --   ALBUMIN 3.6 3.4*   No results for input(s): LIPASE, AMYLASE in the last 168 hours. No results for input(s): AMMONIA in the last 168 hours. CBC: Recent Labs  Lab 09/09/20 2005 09/09/20 2018 09/10/20 0005 09/10/20 1439  WBC 5.8  --  5.3 6.1  NEUTROABS 4.9  --   --   --   HGB 7.2* 8.8* 7.7* 8.5*  HCT 22.1* 26.0* 23.8* 25.8*  MCV 103.3*  --  104.4* 99.6  PLT 155  --  131* 153   Cardiac Enzymes: No results for input(s): CKTOTAL, CKMB, CKMBINDEX, TROPONINI in the last 168 hours. BNP: Invalid input(s): POCBNP CBG: No results for input(s): GLUCAP in the last 168 hours. D-Dimer No results for input(s): DDIMER in the last 72 hours. Hgb A1c No results for input(s): HGBA1C in the last 72 hours. Lipid Profile No results for input(s): CHOL, HDL, LDLCALC, TRIG, CHOLHDL, LDLDIRECT in the last 72 hours. Thyroid function studies No results for input(s): TSH, T4TOTAL, T3FREE, THYROIDAB in the last 72 hours.  Invalid input(s): FREET3 Anemia work up No results for input(s): VITAMINB12, FOLATE, FERRITIN, TIBC, IRON, RETICCTPCT in the last 72 hours. Urinalysis    Component Value Date/Time   COLORURINE YELLOW 05/24/2017 1729    APPEARANCEUR HAZY (A) 05/24/2017 1729   LABSPEC 1.011 05/24/2017 1729   PHURINE 5.0 05/24/2017 1729   GLUCOSEU 50 (A) 05/24/2017 1729   HGBUR SMALL (A) 05/24/2017 1729   BILIRUBINUR NEGATIVE 05/24/2017 1729   KETONESUR NEGATIVE 05/24/2017 1729   PROTEINUR 100 (A) 05/24/2017 1729   UROBILINOGEN 0.2 04/09/2015 1527   NITRITE NEGATIVE 05/24/2017 1729   LEUKOCYTESUR TRACE (A) 05/24/2017 1729   Sepsis Labs Invalid input(s): PROCALCITONIN,  WBC,  LACTICIDVEN Microbiology Recent Results (from the past 240 hour(s))  Resp Panel by RT-PCR (Flu A&B, Covid) Nasopharyngeal Swab     Status: None   Collection Time: 09/04/20  3:49 AM   Specimen: Nasopharyngeal Swab; Nasopharyngeal(NP) swabs in vial transport medium  Result Value Ref Range Status   SARS Coronavirus 2 by RT PCR NEGATIVE NEGATIVE Final    Comment: (NOTE) SARS-CoV-2 target nucleic acids are NOT DETECTED.  The SARS-CoV-2 RNA is generally detectable in upper respiratory specimens during the acute phase of infection. The lowest concentration of SARS-CoV-2 viral copies this assay can detect is 138 copies/mL. A negative result does not preclude SARS-Cov-2 infection and should not be used as the sole basis for treatment or other patient  management decisions. A negative result may occur with  improper specimen collection/handling, submission of specimen other than nasopharyngeal swab, presence of viral mutation(s) within the areas targeted by this assay, and inadequate number of viral copies(<138 copies/mL). A negative result must be combined with clinical observations, patient history, and epidemiological information. The expected result is Negative.  Fact Sheet for Patients:  EntrepreneurPulse.com.au  Fact Sheet for Healthcare Providers:  IncredibleEmployment.be  This test is no t yet approved or cleared by the [place] FDA and  has been authorized for detection and/or diagnosis of  SARS-CoV-2 by FDA under an Emergency Use Authorization (EUA). This EUA will remain  in effect (meaning this test can be used) for the duration of the COVID-19 declaration under Section 564(b)(1) of the Act, 21 U.S.C.section 360bbb-3(b)(1), unless the authorization is terminated  or revoked sooner.       Influenza A by PCR NEGATIVE NEGATIVE Final   Influenza B by PCR NEGATIVE NEGATIVE Final    Comment: (NOTE) The Xpert Xpress SARS-CoV-2/FLU/RSV plus assay is intended as an aid in the diagnosis of influenza from Nasopharyngeal swab specimens and should not be used as a sole basis for treatment. Nasal washings and aspirates are unacceptable for Xpert Xpress SARS-CoV-2/FLU/RSV testing.  Fact Sheet for Patients: EntrepreneurPulse.com.au  Fact Sheet for Healthcare Providers: IncredibleEmployment.be  This test is not yet approved or cleared by the [place] FDA and has been authorized for detection and/or diagnosis of SARS-CoV-2 by FDA under an Emergency Use Authorization (EUA). This EUA will remain in effect (meaning this test can be used) for the duration of the COVID-19 declaration under Section 564(b)(1) of the Act, 21 U.S.C. section 360bbb-3(b)(1), unless the authorization is terminated or revoked.  Performed at [facility], [place] [address], [place], [place] [address]   SARS CORONAVIRUS 2 (TAT 6-24 HRS) Nasopharyngeal Nasopharyngeal Swab     Status: None   Collection Time: 09/09/20  7:31 PM   Specimen: Nasopharyngeal Swab  Result Value Ref Range Status   SARS Coronavirus 2 NEGATIVE NEGATIVE Final    Comment: (NOTE) SARS-CoV-2 target nucleic acids are NOT DETECTED.  The SARS-CoV-2 RNA is generally detectable in upper and lower respiratory specimens during the acute phase of infection. Negative results do not preclude SARS-CoV-2 infection, do not rule out co-infections with other pathogens, and should not be used as the sole  basis for treatment or other patient management decisions. Negative results must be combined with clinical observations, patient history, and epidemiological information. The expected result is Negative.  Fact Sheet for Patients: SugarRoll.be  Fact Sheet for Healthcare Providers: https://www.woods-mathews.com/  This test is not yet approved or cleared by the [place] FDA and  has been authorized for detection and/or diagnosis of SARS-CoV-2 by FDA under an Emergency Use Authorization (EUA). This EUA will remain  in effect (meaning this test can be used) for the duration of the COVID-19 declaration under Se ction 564(b)(1) of the Act, 21 U.S.C. section 360bbb-3(b)(1), unless the authorization is terminated or revoked sooner.  Performed at [facility], [place] [address], [place], [place] [address]   Resp Panel by RT-PCR (Flu A&B, Covid) Nasopharyngeal Swab     Status: None   Collection Time: 09/10/20  2:37 AM   Specimen: Nasopharyngeal Swab; Nasopharyngeal(NP) swabs in vial transport medium  Result Value Ref Range Status   SARS Coronavirus 2 by RT PCR NEGATIVE NEGATIVE Final    Comment: (NOTE) SARS-CoV-2 target nucleic acids are NOT DETECTED.  The SARS-CoV-2  RNA is generally detectable in upper respiratory specimens during the acute phase of infection. The lowest concentration of SARS-CoV-2 viral copies this assay can detect is 138 copies/mL. A negative result does not preclude SARS-Cov-2 infection and should not be used as the sole basis for treatment or other patient management decisions. A negative result may occur with  improper specimen collection/handling, submission of specimen other than nasopharyngeal swab, presence of viral mutation(s) within the areas targeted by this assay, and inadequate number of viral copies(<138 copies/mL). A negative result must be combined with clinical observations, patient history, and  epidemiological information. The expected result is Negative.  Fact Sheet for Patients:  EntrepreneurPulse.com.au  Fact Sheet for Healthcare Providers:  IncredibleEmployment.be  This test is no t yet approved or cleared by the [place] FDA and  has been authorized for detection and/or diagnosis of SARS-CoV-2 by FDA under an Emergency Use Authorization (EUA). This EUA will remain  in effect (meaning this test can be used) for the duration of the COVID-19 declaration under Section 564(b)(1) of the Act, 21 U.S.C.section 360bbb-3(b)(1), unless the authorization is terminated  or revoked sooner.       Influenza A by PCR NEGATIVE NEGATIVE Final   Influenza B by PCR NEGATIVE NEGATIVE Final    Comment: (NOTE) The Xpert Xpress SARS-CoV-2/FLU/RSV plus assay is intended as an aid in the diagnosis of influenza from Nasopharyngeal swab specimens and should not be used as a sole basis for treatment. Nasal washings and aspirates are unacceptable for Xpert Xpress SARS-CoV-2/FLU/RSV testing.  Fact Sheet for Patients: EntrepreneurPulse.com.au  Fact Sheet for Healthcare Providers: IncredibleEmployment.be  This test is not yet approved or cleared by the [place] FDA and has been authorized for detection and/or diagnosis of SARS-CoV-2 by FDA under an Emergency Use Authorization (EUA). This EUA will remain in effect (meaning this test can be used) for the duration of the COVID-19 declaration under Section 564(b)(1) of the Act, 21 U.S.C. section 360bbb-3(b)(1), unless the authorization is terminated or revoked.  Performed at [facility], [place] [address], [place], [place] [address]      SIGNED:   Marylu Lund, MD  [facility] 09/12/2020, 4:38 PM  If 7PM-7AM, please contact night-coverage

## 2020-09-12 NOTE — Sedation Documentation (Signed)
No intervention needed per Dr. Serafina Royals at this time.

## 2020-09-13 ENCOUNTER — Telehealth: Payer: Self-pay | Admitting: *Deleted

## 2020-09-13 LAB — DRUG SCREEN 10 W/CONF, SERUM
Amphetamines, IA: NEGATIVE ng/mL
Barbiturates, IA: NEGATIVE ug/mL
Benzodiazepines, IA: NEGATIVE ng/mL
Cocaine & Metabolite, IA: NEGATIVE ng/mL
Methadone, IA: NEGATIVE ng/mL
Opiates, IA: NEGATIVE ng/mL
Oxycodones, IA: NEGATIVE ng/mL
Phencyclidine, IA: NEGATIVE ng/mL
Propoxyphene, IA: NEGATIVE ng/mL
THC(Marijuana) Metabolite, IA: NEGATIVE ng/mL

## 2020-09-13 NOTE — Telephone Encounter (Signed)
Called patient informed him that we will need clearance form from the surgeons office. Patient verbally understands he will call the surgeons office to request he send that to [place]. No further questions.

## 2020-09-13 NOTE — Telephone Encounter (Signed)
Left message for patient to return call.

## 2020-09-13 NOTE — Telephone Encounter (Signed)
   [facility] Pre-operative Risk Assessment    HEARTCARE STAFF: - Please ensure there is not already an duplicate clearance open for this procedure. - Under Visit Info/Reason for Call, type in Other and utilize the format Clearance MM/DD/YY or Clearance TBD. Do not use dashes or single digits. - If request is for dental extraction, please clarify the # of teeth to be extracted.  Request for surgical clearance: SEE PREVIOUS NOTES   1. What type of surgery is being performed? LUMBAR DECOMPRESSION   2. When is this surgery scheduled? TBD   3. What type of clearance is required (medical clearance vs. Pharmacy clearance to hold med vs. Both)? MEDICAL  4. Are there any medications that need to be held prior to surgery and how long? NONE LISTED   5. Practice name and name of physician performing surgery? [facility]; DR. JEFFREY BEANE   6. What is the office phone number? [phone]   7.   What is the office fax number? [place]  8.   Anesthesia type (None, local, MAC, general) ? GENERAL   Thomas Robinson 09/13/2020, 2:28 PM  _________________________________________________________________   (provider comments below)

## 2020-09-14 NOTE — Telephone Encounter (Signed)
Thomas Robinson is a complicated patient with history of ESRD, HTN, CVA, asthma, CAD, chronic diastolic heart failure, anemia of chronic disease, noncompliance and ascending aortic aneurysm.  He was last seen by Dr. Harriet Robinson on 08/21/2020.  Since then, he has been admitted to [facility] on 08/25/2020 with shortness of breath and weakness after missing his dialysis for 2 weeks.  He was admitted for acute respiratory failure with volume overload.  He also had a episode of unresponsiveness during the hospitalization.  The troponin was elevated.  He was evaluated by cardiology service and eventually underwent left heart cath on 08/27/2020 which showed no evidence of coronary artery disease and normal LVEDP.  Echocardiogram showed EF 65 to 70%, moderately dilated RV, mild TR, mild AI/AAS, severe pulmonary hypertension.  Hemoglobin was low between 6 and 7.  He eventually left AMA.    Since then, he has been admitted to the hospital on 1/4 and again on 1/9, he left the hospital AMA both times.  Unfortunately, patient has significant issue with noncompliance and insist on obtaining dialysis equipment to do home dialysis by family member despite medical advisement.  During the most recent hospitalization, it was noted the patient has been discharged from his local clinic due to behavioral issues.  Cardiology service was consulted for chest discomfort and elevated troponin.  He was seen by Dr. Acie Robinson.  His chest pain was felt to be related to volume overload secondary to hemodialysis noncompliance.  Despite multiple admissions recently, it does not appear to me that he has a cardiac issue that would prevent him from having the surgery.  Although his pulmonary artery pressure on the last echocardiogram was quite high on 08/28/2020 despite the fact his LVEDP was only 12 mm to day prior, therefore I suspect patient has a underlying severe pulmonary hypertension.  Nevertheless, his main issue remains medical  noncompliance.  Dr. Harriet Robinson, please advise if you think the patient requires any additional work-up?  Please forward your response to P CV DIV PREOP    Cath 08/27/2020 The right radial artery was accessed using a 6 Fr sheath using standard  Seldinger technique. A selective coronary angiogram was performed using  the above-mentioned diagnostic catheters in multiple standard views and  they were interpreted in the procedure. An LVEDP was measured at 12 mmHg.   This demonstrated the following findings:  LM: Normal  LAD: Normal  Lcx: Normal  RCA: Normal   At this time, all equipment was removed without complications and a  transradial band wasused to obtain hemostasis. No immediate  complications were noted. The patient was safely transferred to the  hospital/ICU bed. Total contrast used was 54 mL.   The Cath Lab staff, patient, family, referring physician and all involved  care team members were briefed in detail regarding the proceedings in the  Cath Lab. All questions were answered.   Impression:  Normal coronary arteries  Normal left ventricular end-diastolic pressure   Plan:  1. Secondary prevention of coronary artery disease  2. Medical management     Echo 08/28/2020 SUMMARY  Left ventricular systolic functionis normal.  LV ejection fraction = 65-70%.  There is moderate concentric left ventricular hypertrophy.  No segmental wall motion abnormalities seen in the left ventricle  Left ventricular filling pattern is prolonged relaxation.  The right ventricle is moderately dilated.  The right ventricular systolic function is normal.  The left atrium is mildly dilated.  The right atrium is mildly dilated.  There is mild  aortic stenosis.  There is mild aortic regurgitation.  There is mild tricuspid regurgitation.  Severe pulmonary hypertension.  Estimated right ventricular systolic pressure is 47-84 mmHg.  Mildly dilated ascending aorta.  Ascending aorta  diameter 4.1 cm  IVC size was severely dilated.  There is a trivial pericardial effusion.  There is no comparison study available.

## 2020-09-18 ENCOUNTER — Other Ambulatory Visit: Payer: Self-pay

## 2020-09-18 ENCOUNTER — Emergency Department (HOSPITAL_COMMUNITY)
Admission: EM | Admit: 2020-09-18 | Discharge: 2020-09-18 | Disposition: A | Discharge status: Home / Self Care | Payer: Medicare Other | Attending: Emergency Medicine | Admitting: Emergency Medicine

## 2020-09-18 ENCOUNTER — Emergency Department (HOSPITAL_COMMUNITY): Payer: Medicare Other

## 2020-09-18 DIAGNOSIS — J9601 Acute respiratory failure with hypoxia: Secondary | ICD-10-CM | POA: Insufficient documentation

## 2020-09-18 DIAGNOSIS — I5032 Chronic diastolic (congestive) heart failure: Secondary | ICD-10-CM | POA: Diagnosis not present

## 2020-09-18 DIAGNOSIS — R0602 Shortness of breath: Secondary | ICD-10-CM | POA: Diagnosis present

## 2020-09-18 DIAGNOSIS — Z992 Dependence on renal dialysis: Secondary | ICD-10-CM | POA: Insufficient documentation

## 2020-09-18 DIAGNOSIS — I132 Hypertensive heart and chronic kidney disease with heart failure and with stage 5 chronic kidney disease, or end stage renal disease: Secondary | ICD-10-CM | POA: Insufficient documentation

## 2020-09-18 DIAGNOSIS — J45901 Unspecified asthma with (acute) exacerbation: Secondary | ICD-10-CM | POA: Diagnosis not present

## 2020-09-18 DIAGNOSIS — Z20822 Contact with and (suspected) exposure to covid-19: Secondary | ICD-10-CM | POA: Diagnosis not present

## 2020-09-18 DIAGNOSIS — R040 Epistaxis: Secondary | ICD-10-CM | POA: Insufficient documentation

## 2020-09-18 DIAGNOSIS — N186 End stage renal disease: Secondary | ICD-10-CM | POA: Diagnosis not present

## 2020-09-18 LAB — RENAL FUNCTION PANEL
Albumin: 3.3 g/dL — ABNORMAL LOW (ref 3.5–5.0)
Anion gap: 22 — ABNORMAL HIGH (ref 5–15)
BUN: 133 mg/dL — ABNORMAL HIGH (ref 8–23)
CO2: 16 mmol/L — ABNORMAL LOW (ref 22–32)
Calcium: 10.3 mg/dL (ref 8.9–10.3)
Chloride: 105 mmol/L (ref 98–111)
Creatinine, Ser: 13.02 mg/dL — ABNORMAL HIGH (ref 0.61–1.24)
GFR, Estimated: 4 mL/min — ABNORMAL LOW (ref >60)
Glucose, Bld: 102 mg/dL — ABNORMAL HIGH (ref 70–99)
Phosphorus: 9.6 mg/dL — ABNORMAL HIGH (ref 2.5–4.6)
Potassium: 6.1 mmol/L — ABNORMAL HIGH (ref 3.5–5.1)
Sodium: 143 mmol/L (ref 135–145)

## 2020-09-18 LAB — COMPREHENSIVE METABOLIC PANEL
ALT: 16 U/L (ref 0–44)
AST: 10 U/L — ABNORMAL LOW (ref 15–41)
Albumin: 3.6 g/dL (ref 3.5–5.0)
Alkaline Phosphatase: 69 U/L (ref 38–126)
Anion gap: 24 — ABNORMAL HIGH (ref 5–15)
BUN: 132 mg/dL — ABNORMAL HIGH (ref 8–23)
CO2: 15 mmol/L — ABNORMAL LOW (ref 22–32)
Calcium: 10.4 mg/dL — ABNORMAL HIGH (ref 8.9–10.3)
Chloride: 104 mmol/L (ref 98–111)
Creatinine, Ser: 13.1 mg/dL — ABNORMAL HIGH (ref 0.61–1.24)
GFR, Estimated: 4 mL/min — ABNORMAL LOW (ref >60)
Glucose, Bld: 99 mg/dL (ref 70–99)
Potassium: 5.9 mmol/L — ABNORMAL HIGH (ref 3.5–5.1)
Sodium: 143 mmol/L (ref 135–145)
Total Bilirubin: 0.7 mg/dL (ref 0.3–1.2)
Total Protein: 6.6 g/dL (ref 6.5–8.1)

## 2020-09-18 LAB — CBC
HCT: 25.1 % — ABNORMAL LOW (ref 39.0–52.0)
HCT: 24.4 % — ABNORMAL LOW (ref 39.0–52.0)
Hemoglobin: 7.7 g/dL — ABNORMAL LOW (ref 13.0–17.0)
Hemoglobin: 7.4 g/dL — ABNORMAL LOW (ref 13.0–17.0)
MCH: 34.4 pg — ABNORMAL HIGH (ref 26.0–34.0)
MCH: 33.8 pg (ref 26.0–34.0)
MCHC: 30.7 g/dL (ref 30.0–36.0)
MCHC: 30.3 g/dL (ref 30.0–36.0)
MCV: 112.1 fL — ABNORMAL HIGH (ref 80.0–100.0)
MCV: 111.4 fL — ABNORMAL HIGH (ref 80.0–100.0)
Platelets: 135 10*3/uL — ABNORMAL LOW (ref 150–400)
Platelets: 120 10*3/uL — ABNORMAL LOW (ref 150–400)
RBC: 2.24 MIL/uL — ABNORMAL LOW (ref 4.22–5.81)
RBC: 2.19 MIL/uL — ABNORMAL LOW (ref 4.22–5.81)
RDW: 21.7 % — ABNORMAL HIGH (ref 11.5–15.5)
RDW: 21.7 % — ABNORMAL HIGH (ref 11.5–15.5)
WBC: 5.8 10*3/uL (ref 4.0–10.5)
WBC: 5.3 10*3/uL (ref 4.0–10.5)
nRBC: 0 % (ref 0.0–0.2)
nRBC: 0 % (ref 0.0–0.2)

## 2020-09-18 LAB — BRAIN NATRIURETIC PEPTIDE: B Natriuretic Peptide: 4033.1 pg/mL — ABNORMAL HIGH (ref 0.0–100.0)

## 2020-09-18 LAB — TROPONIN I (HIGH SENSITIVITY)
Troponin I (High Sensitivity): 181 ng/L (ref <18)
Troponin I (High Sensitivity): 187 ng/L (ref <18)

## 2020-09-18 LAB — I-STAT CHEM 8, ED
BUN: >130 mg/dL — ABNORMAL HIGH (ref 8–23)
Calcium, Ion: 1.27 mmol/L (ref 1.15–1.40)
Chloride: 109 mmol/L (ref 98–111)
Creatinine, Ser: 14 mg/dL — ABNORMAL HIGH (ref 0.61–1.24)
Glucose, Bld: 90 mg/dL (ref 70–99)
HCT: 21 % — ABNORMAL LOW (ref 39.0–52.0)
Hemoglobin: 7.1 g/dL — ABNORMAL LOW (ref 13.0–17.0)
Potassium: 5.7 mmol/L — ABNORMAL HIGH (ref 3.5–5.1)
Sodium: 140 mmol/L (ref 135–145)
TCO2: 20 mmol/L — ABNORMAL LOW (ref 22–32)

## 2020-09-18 LAB — LACTIC ACID, PLASMA: Lactic Acid, Venous: 0.9 mmol/L (ref 0.5–1.9)

## 2020-09-18 LAB — RESP PANEL BY RT-PCR (FLU A&B, COVID) ARPGX2
Influenza A by PCR: NEGATIVE
Influenza B by PCR: NEGATIVE
SARS Coronavirus 2 by RT PCR: NEGATIVE

## 2020-09-18 MED ORDER — ALTEPLASE 2 MG IJ SOLR: 2.0000 mg | Freq: Once | INTRAMUSCULAR | Status: DC | PRN

## 2020-09-18 MED ORDER — SODIUM CHLORIDE 0.9 % IV SOLN: 100.0000 mL | INTRAVENOUS | Status: DC | PRN

## 2020-09-18 MED ORDER — LIDOCAINE-PRILOCAINE 2.5-2.5 % EX CREA
1.0000 "application " | TOPICAL_CREAM | CUTANEOUS | Status: DC | PRN
  Filled 2020-09-18: qty 5

## 2020-09-18 MED ORDER — HEPARIN SODIUM (PORCINE) 1000 UNIT/ML DIALYSIS
20.0000 [IU]/kg | INTRAMUSCULAR | Status: DC | PRN
  Filled 2020-09-18: qty 2

## 2020-09-18 MED ORDER — CALCIUM GLUCONATE-NACL 1-0.675 GM/50ML-% IV SOLN
1.0000 g | Freq: Once | INTRAVENOUS | Status: AC
  Administered 2020-09-18: 1000 mg via INTRAVENOUS
  Filled 2020-09-18: qty 50

## 2020-09-18 MED ORDER — CHLORHEXIDINE GLUCONATE CLOTH 2 % EX PADS: 6.0000 | MEDICATED_PAD | Freq: Every day | CUTANEOUS | Status: DC

## 2020-09-18 MED ORDER — PENTAFLUOROPROP-TETRAFLUOROETH EX AERO
1.0000 "application " | INHALATION_SPRAY | CUTANEOUS | Status: DC | PRN
  Filled 2020-09-18: qty 30

## 2020-09-18 MED ORDER — LIDOCAINE HCL (PF) 1 % IJ SOLN: 5.0000 mL | INTRAMUSCULAR | Status: DC | PRN

## 2020-09-18 MED ORDER — HEPARIN SODIUM (PORCINE) 1000 UNIT/ML DIALYSIS
1000.0000 [IU] | INTRAMUSCULAR | Status: DC | PRN
  Filled 2020-09-18: qty 1

## 2020-09-18 MED ORDER — SILVER NITRATE-POT NITRATE 75-25 % EX MISC
1.0000 "application " | Freq: Once | CUTANEOUS | Status: DC
  Filled 2020-09-18: qty 1

## 2020-09-18 NOTE — ED Notes (Signed)
Pt has a small nose bleed, EDP was made aware.

## 2020-09-18 NOTE — ED Triage Notes (Signed)
BIB [facility] from home. Complaining of swelling to left arm and bilateral legs. Having increased shortness of breath and a cough that started yesterday. Rhonchi and wheezing reported. Unvaccinated.   74% room air. Placed on NRB with EMS came up to 90s. 128 CBG 70-90 heart rate 190/100.

## 2020-09-18 NOTE — ED Provider Notes (Signed)
Pt signed out by Dr. Sedonia Small.  He remains in the mid-90s on 2L oxygen.  His CXR shows pulmonary edema.  Covid negative.  Potassium is 5.9.  Pt d/w Dr. Jonnie Finner (nephrology) who will see pt and take him for dialysis.  Pt has chronic anemia due to ESRD.  DVT study pending.  Thomas Robinson was evaluated in Emergency Department on 09/18/2020 for the symptoms described in the history of present illness. He was evaluated in the context of the global COVID-19 pandemic, which necessitated consideration that the patient might be at risk for infection with the SARS-CoV-2 virus that causes COVID-19. Institutional protocols and algorithms that pertain to the evaluation of patients at risk for COVID-19 are in a state of rapid change based on information released by regulatory bodies including the CDC and federal and state organizations. These policies and algorithms were followed during the patient's care in the ED.   Pt did develop a nose bleed while here.  It is likely from the nasal cannula irritation.  Pt changed over to a mask.  I cauterized his nose with stopping of the bleeding.  See my separate procedure note.    Isla Pence, MD 09/19/20 [phone]

## 2020-09-18 NOTE — ED Provider Notes (Signed)
[facility] Emergency Department Provider Note MRN:  [id number]  Arrival date & time: 09/18/20     Chief Complaint   Shortness of Breath   History of Present Illness   Thomas Robinson is a 60 y.o. year-old male with a history of stroke, CHF, ESRD presenting to the ED with chief complaint of shortness of breath.  Worsening cough and shortness of breath over the past 24 hours.  Has not had dialysis in 1 week.  Recent admission for fluid overloaded state in the setting of missed dialysis.  Denies chest pain, no fever, no abdominal pain.  Endorsing pain and swelling to his left upper arm for several weeks, worsening swelling in the bilateral legs for the past few days.  Review of Systems  A complete 10 system review of systems was obtained and all systems are negative except as noted in the HPI and PMH.   Patient's Health History    Past Medical History:  Diagnosis Date  . Anaphylactic shock, unspecified, sequela 06/10/2019  . Arthritis   . Asthma, chronic, unspecified asthma severity, with acute exacerbation 10/23/2017  . CAP (community acquired pneumonia) 10/23/2017  . Carpal tunnel syndrome of right wrist 10/05/2018  . Cataract    right - removed by surgery  . Cerebral infarction due to thrombosis of cerebral artery ([place])   . Cervical disc herniation 01/04/2020  . Chronic diastolic heart failure ([place]) 04/13/2018  . Chronic low back pain 11/24/2019  . Constipation   . Cough    chronic cough  . Encounter for immunization 07/08/2017  . ESRD on hemodialysis ([place]) 02/16/2018   Dialysis  T Thus Sat  - [facility]   . Fall 06/04/2019  . GERD (gastroesophageal reflux disease) 06/04/2019  . GI bleeding 05/24/2017  . Gram-negative sepsis, unspecified ([place]) 09/05/2017  . History of fusion of cervical spine 03/26/2020  . Hyperlipidemia   . Hypertension   . Hypokalemia 06/04/2017  . Iron deficiency anemia, unspecified 09/09/2017  . Left shoulder pain 11/11/2019  .  Lumbar radiculopathy 11/24/2019  . Lung contusion 06/04/2019  . LVH (left ventricular hypertrophy) due to hypertensive disease, with heart failure ([place]) 06/18/2020  . Macrocytic anemia 05/24/2017  . Moderate protein-calorie malnutrition ([place]) 06/06/2017  . Myofascial pain syndrome 06/12/2020  . Neuritis of right ulnar nerve 09/13/2018  . Non-compliance with renal dialysis ([place]) 02/08/2020  . Oxygen deficiency 12/30/2019   O2 sats on RA 87% at PAT appt   . Pain in joint of right elbow 09/13/2018  . Renovascular hypertension 06/22/2020  . Restless leg syndrome   . Rib fractures 06/2019   Right  . Secondary hyperparathyroidism of renal origin ([place]) 06/04/2017  . Thoracic ascending aortic aneurysm (HCC)    4.5 cm 06/04/19 CT  . Ulnar neuropathy 10/05/2018  . Volume overload 10/23/2017    Past Surgical History:  Procedure Laterality Date  . ANTERIOR CERVICAL DECOMP/DISCECTOMY FUSION N/A 01/04/2020   Procedure: ANTERIOR CERVICAL DECOMPRESSION/DISCECTOMY FUSION CERVICAL FIVE THROUGH SEVEN;  Surgeon: Melina Schools, MD;  Location: [place];  Service: Orthopedics;  Laterality: N/A;  3 hrs  . AV FISTULA PLACEMENT Left 05/28/2017   Procedure: LEFT ARM ARTERIOVENOUS (AV) FISTULA CREATION;  Surgeon: Conrad Palestine, MD;  Location: [place];  Service: Vascular;  Laterality: Left;  . EYE SURGERY Right 06/02/2019   Cataract removed  . FRACTURE SURGERY    . IR DIALY SHUNT INTRO NEEDLE/INTRACATH INITIAL W/IMG LEFT Left 09/12/2020  . IR FLUORO GUIDE CV LINE RIGHT  05/25/2017  . IR US GUIDE VASC ACCESS LEFT  09/12/2020  . IR US GUIDE VASC ACCESS RIGHT  05/25/2017  . REVISON OF ARTERIOVENOUS FISTULA Left 07/18/2019   Procedure: REVISION PLICATION OF RADIOCEPHALIC ARTERIOVENOUS FISTULA LEFT ARM;  Surgeon: Angelia Mould, MD;  Location: [place];  Service: Vascular;  Laterality: Left;  . RINOPLASTY      Family History  Problem Relation Age of Onset  . Cancer Mother     Social History   Socioeconomic History  .  Marital status: Single    Spouse name: Not on file  . Number of children: Not on file  . Years of education: Not on file  . Highest education level: Not on file  Occupational History  . Not on file  Tobacco Use  . Smoking status: Never Smoker  . Smokeless tobacco: Never Used  Vaping Use  . Vaping Use: Never used  Substance and Sexual Activity  . Alcohol use: Not Currently    Alcohol/week: 0.0 standard drinks    Comment: has a drink once in a while- 12/30/19  . Drug use: No  . Sexual activity: Not Currently  Other Topics Concern  . Not on file  Social History Narrative   Lives with a roommate in a one story home.  Has 1 child.  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  Education: high school.   Social Determinants of Health   Financial Resource Strain: Not on file  Food Insecurity: Not on file  Transportation Needs: Not on file  Physical Activity: Not on file  Stress: Not on file  Social Connections: Not on file  Intimate Partner Violence: Not on file     Physical Exam   Vitals:   09/18/20 0656 09/18/20 0658  BP:  (!) 193/82  Pulse:  67  Resp:  18  Temp: 98.4 F (36.9 C)   SpO2:  95%    CONSTITUTIONAL: Chronically ill-appearing, NAD NEURO:  Alert and oriented x 3, no focal deficits EYES:  eyes equal and reactive ENT/NECK:  no LAD, no JVD CARDIO: Regular rate, well-perfused, normal S1 and S2 PULM:  CTAB no wheezing or rhonchi GI/GU:  normal bowel sounds, non-distended, non-tender MSK/SPINE:  No gross deformities, pitting edema to the left upper extremity, strong palpable thrill to the forearm AV fistula; pitting edema to bilateral lower extremities SKIN:  no rash, atraumatic PSYCH:  Appropriate speech and behavior  *Additional and/or pertinent findings included in MDM below  Diagnostic and Interventional Summary    EKG Interpretation  Date/Time:  Tuesday September 18 2020 06:56:10 EST Ventricular Rate:  78 PR Interval:    QRS Duration: 124 QT  Interval:  409 QTC Calculation: 466 R Axis:   19 Text Interpretation: Sinus rhythm LAE, consider biatrial enlargement Left bundle branch block Artifact in lead(s) I II aVR aVL aVF V1 V2 Confirmed by Gerlene Fee [phone]) on 09/18/2020 7:01:23 AM      Labs Reviewed  RESP PANEL BY RT-PCR (FLU A&B, COVID) ARPGX2  BRAIN NATRIURETIC PEPTIDE  CBC  COMPREHENSIVE METABOLIC PANEL  LACTIC ACID, PLASMA  TROPONIN I (HIGH SENSITIVITY)    DG Chest Port 1 View    (Results Pending)  US Venous Img Upper Uni Left    (Results Pending)    Medications  calcium gluconate 1 g/ 50 mL sodium chloride IVPB (1,000 mg Intravenous New Bag/Given 09/18/20 0654)     Procedures  /  Critical Care .Critical Care Performed by: Maudie Flakes, MD Authorized by:  Maudie Flakes, MD   Critical care provider statement:    Critical care time (minutes):  35   Critical care was necessary to treat or prevent imminent or life-threatening deterioration of the following conditions: Concern for hyperkalemia, fluid overloaded state in the setting of dialysis, respiratory failure.   Critical care was time spent personally by me on the following activities:  Discussions with consultants, evaluation of patient's response to treatment, examination of patient, ordering and performing treatments and interventions, ordering and review of laboratory studies, ordering and review of radiographic studies, pulse oximetry, re-evaluation of patient's condition, obtaining history from patient or surrogate and review of old charts    ED Course and Medical Decision Making  I have reviewed the triage vital signs, the nursing notes, and pertinent available records from the EMR.  Listed above are laboratory and imaging tests that I personally ordered, reviewed, and interpreted and then considered in my medical decision making (see below for details).  Missed dialysis, short of breath, satting in the 70s on room air, placed on nonrebreather with  EMS.  Concern for peaked T waves on EMS rhythm strips.  IV access quickly achieved, providing empiric calcium while we await lab results.  Patient seems a bit improved from a hypoxia standpoint, in the high 80s on room air, placed on 2 L nasal cannula.  Patient's asymmetric upper extremity edema is fairly chronic, had a recent fistulogram few days ago which was normal.  No recent vascular imaging to exclude DVT on record, will obtain today.  Overall suspect patient will need urgent dialysis and likely admission.  Signed out to oncoming provider shift change       Barth Kirks. Sedonia Small, [place] [email or web address]  Final Clinical Impressions(s) / ED Diagnoses     ICD-10-CM   1. Acute respiratory failure with hypoxia (HCC)  J96.01     ED Discharge Orders    None       Discharge Instructions Discussed with and Provided to Patient:   Discharge Instructions   None       Maudie Flakes, MD 09/18/20 [phone]

## 2020-09-18 NOTE — Procedures (Signed)
Asked to see patient for dialysis. He had some epistaxis in the ED. Up in HD he bled around one of the needles for 88min, stopped w/ holding pressure. Platelets are wnl and not taking any blood thinners. May have uremic plt dysfunction. Bleeding stopped for now. He is stable on exam, chron ill, 2+ pitting LE > UE edema.  Pt has not OP HD unit and comes to the hospital for dialysis, he was discharged from [facility] dialysis due to breach of a behavioral contract (threatening staff). Well send back to ED after HD for reassessment before dc home.   I was present at this dialysis session, have reviewed the session itself and made  appropriate changes [name] MD [place] pager [phone]   09/18/2020, 3:48 PM

## 2020-09-18 NOTE — ED Provider Notes (Signed)
  Physical Exam  BP (!) 188/92   Pulse 80   Temp 98.4 F (36.9 C) (Oral)   Resp (!) 24   Ht 5\' 8"  (1.727 m)   Wt 61.2 kg   SpO2 94%   BMI 20.53 kg/m   Physical Exam  ED Course/Procedures     .Epistaxis Management  Date/Time: 09/18/2020 11:17 AM Performed by: Isla Pence, MD Authorized by: Isla Pence, MD   Consent:    Consent obtained:  Verbal   Consent given by:  Patient   Risks, benefits, and alternatives were discussed: yes     Risks discussed:  Bleeding and pain Universal protocol:    Procedure explained and questions answered to patient or proxy's satisfaction: yes     Patient identity confirmed:  Verbally with patient Procedure details:    Treatment site:  R anterior   Treatment method:  Silver nitrate   Treatment complexity:  Limited   Treatment episode: initial   Post-procedure details:    Assessment:  Bleeding stopped   Procedure completion:  Tolerated well, no immediate complications    MDM         Isla Pence, MD 09/18/20 1118

## 2020-09-18 NOTE — Discharge Instructions (Signed)
Follow-up with your primary doctor and nephrologist. Continue your regular dialysis. Return for any difficulty breathing or other new concerning symptom.

## 2020-09-18 NOTE — ED Notes (Signed)
DC instructions reviewed with pt. PT verbalized understanding. PT DC °

## 2020-09-19 ENCOUNTER — Emergency Department (HOSPITAL_BASED_OUTPATIENT_CLINIC_OR_DEPARTMENT_OTHER)
Admission: EM | Admit: 2020-09-19 | Discharge: 2020-09-19 | Disposition: A | Discharge status: Home / Self Care | Payer: Medicare Other | Attending: Emergency Medicine | Admitting: Emergency Medicine

## 2020-09-19 ENCOUNTER — Emergency Department (HOSPITAL_BASED_OUTPATIENT_CLINIC_OR_DEPARTMENT_OTHER): Payer: Medicare Other

## 2020-09-19 ENCOUNTER — Other Ambulatory Visit: Payer: Self-pay

## 2020-09-19 ENCOUNTER — Encounter (HOSPITAL_BASED_OUTPATIENT_CLINIC_OR_DEPARTMENT_OTHER): Payer: Self-pay

## 2020-09-19 DIAGNOSIS — N185 Chronic kidney disease, stage 5: Secondary | ICD-10-CM | POA: Diagnosis not present

## 2020-09-19 DIAGNOSIS — J45909 Unspecified asthma, uncomplicated: Secondary | ICD-10-CM | POA: Insufficient documentation

## 2020-09-19 DIAGNOSIS — R2232 Localized swelling, mass and lump, left upper limb: Secondary | ICD-10-CM | POA: Diagnosis not present

## 2020-09-19 DIAGNOSIS — I120 Hypertensive chronic kidney disease with stage 5 chronic kidney disease or end stage renal disease: Secondary | ICD-10-CM | POA: Diagnosis not present

## 2020-09-19 DIAGNOSIS — Z95828 Presence of other vascular implants and grafts: Secondary | ICD-10-CM | POA: Insufficient documentation

## 2020-09-19 DIAGNOSIS — I110 Hypertensive heart disease with heart failure: Secondary | ICD-10-CM | POA: Diagnosis not present

## 2020-09-19 DIAGNOSIS — M7989 Other specified soft tissue disorders: Secondary | ICD-10-CM

## 2020-09-19 DIAGNOSIS — Z992 Dependence on renal dialysis: Secondary | ICD-10-CM | POA: Diagnosis not present

## 2020-09-19 DIAGNOSIS — Z8673 Personal history of transient ischemic attack (TIA), and cerebral infarction without residual deficits: Secondary | ICD-10-CM | POA: Insufficient documentation

## 2020-09-19 DIAGNOSIS — I5031 Acute diastolic (congestive) heart failure: Secondary | ICD-10-CM | POA: Insufficient documentation

## 2020-09-19 DIAGNOSIS — Z79899 Other long term (current) drug therapy: Secondary | ICD-10-CM | POA: Diagnosis not present

## 2020-09-19 NOTE — ED Notes (Addendum)
Left arm swelling after dialysis  At [facility] and he states now his left arm is swollen and  Both feet / ankles are swollen  But pt states swelling is less , states yesterday  Was first time after  he missed a week of dialysis  X 3,   Pulse ox drops to  87 when pt is talking, placed  On 2 l [place]

## 2020-09-19 NOTE — ED Triage Notes (Signed)
Pt oxygen sat in triage 88-92 % on room air

## 2020-09-19 NOTE — ED Triage Notes (Signed)
Pt reports swelling worsened in her arm that has his dialysis fistula. Pt states that [facility] stuck and drew blood above his fistula on his left arm and has ongoing swelling. Pt reports that yesterday the dialysis center they had difficulty hooking him up to the dialysis and difficulty with bleeding afterwards. Pt reports increased swelling today

## 2020-09-19 NOTE — Discharge Instructions (Signed)
The nephrologist advised you are suppose to have dialysis at [place] on Friday.  If you become short of breath before you should to go to [facility].  He advised your new dialysis center can set you up for a fistulagram if swelling persist.

## 2020-09-19 NOTE — ED Provider Notes (Signed)
[facility] EMERGENCY DEPARTMENT Provider Note   CSN: [id number] Arrival date & time: 09/19/20  1254     History Chief Complaint  Patient presents with  . Arm Swelling    Lord Lancour is a 60 y.o. male.  The history is provided by the patient. No language interpreter was used.  Arm Injury Pain details:    Quality:  Aching   Severity:  Moderate   Onset quality:  Gradual   Timing:  Constant Dislocation: no   Foreign body present:  No foreign bodies Relieved by:  Nothing Worsened by:  Nothing Pt reports he has swelling to his left arm.  Pt reports swelling began after having dialysis at [facility].  Pt is currently getting dialysis at the hospital because he can not go to previous dialysis center     Past Medical History:  Diagnosis Date  . Anaphylactic shock, unspecified, sequela 06/10/2019  . Arthritis   . Asthma, chronic, unspecified asthma severity, with acute exacerbation 10/23/2017  . CAP (community acquired pneumonia) 10/23/2017  . Carpal tunnel syndrome of right wrist 10/05/2018  . Cataract    right - removed by surgery  . Cerebral infarction due to thrombosis of cerebral artery ([place])   . Cervical disc herniation 01/04/2020  . Chronic diastolic heart failure ([place]) 04/13/2018  . Chronic low back pain 11/24/2019  . Constipation   . Cough    chronic cough  . Encounter for immunization 07/08/2017  . ESRD on hemodialysis ([place]) 02/16/2018   Dialysis  T Thus Sat  - [facility]   . Fall 06/04/2019  . GERD (gastroesophageal reflux disease) 06/04/2019  . GI bleeding 05/24/2017  . Gram-negative sepsis, unspecified ([place]) 09/05/2017  . History of fusion of cervical spine 03/26/2020  . Hyperlipidemia   . Hypertension   . Hypokalemia 06/04/2017  . Iron deficiency anemia, unspecified 09/09/2017  . Left shoulder pain 11/11/2019  . Lumbar radiculopathy 11/24/2019  . Lung contusion 06/04/2019  . LVH (left ventricular hypertrophy) due to hypertensive disease,  with heart failure ([place]) 06/18/2020  . Macrocytic anemia 05/24/2017  . Moderate protein-calorie malnutrition ([place]) 06/06/2017  . Myofascial pain syndrome 06/12/2020  . Neuritis of right ulnar nerve 09/13/2018  . Non-compliance with renal dialysis ([place]) 02/08/2020  . Oxygen deficiency 12/30/2019   O2 sats on RA 87% at PAT appt   . Pain in joint of right elbow 09/13/2018  . Renovascular hypertension 06/22/2020  . Restless leg syndrome   . Rib fractures 06/2019   Right  . Secondary hyperparathyroidism of renal origin ([place]) 06/04/2017  . Thoracic ascending aortic aneurysm (HCC)    4.5 cm 06/04/19 CT  . Ulnar neuropathy 10/05/2018  . Volume overload 10/23/2017    Patient Active Problem List   Diagnosis Date Noted  . Hyperkalemia 08/05/2020  . Arthritis   . Asthma   . Cataract   . Cerebral infarction due to thrombosis of cerebral artery ([place])   . History of CVA (cerebrovascular accident)   . Chronic kidney disease   . Dyspnea   . Hyperlipidemia   . Hypertension   . Kidney failure   . Restless leg syndrome   . Degenerative lumbar spinal stenosis 06/26/2020  . Diastolic dysfunction 09/03/7251  . Renovascular hypertension 06/22/2020  . Demand ischemia ([place]) 06/18/2020  . LVH (left ventricular hypertrophy) due to hypertensive disease, with heart failure ([place]) 06/18/2020  . Myofascial pain syndrome 06/12/2020  . Encounter for removal of sutures 03/29/2020  . History of fusion  of cervical spine 03/26/2020  . Non-compliance with renal dialysis ([place]) 02/08/2020  . Acute pulmonary edema ([place]) 01/23/2020  . Cervical disc herniation 01/04/2020  . Tachycardia 01/04/2020  . SOB (shortness of breath)   . Oxygen deficiency 12/30/2019  . Chronic low back pain 11/24/2019  . Degeneration of lumbar intervertebral disc 11/24/2019  . Lumbar radiculopathy 11/24/2019  . Left shoulder pain 11/11/2019  . Anaphylactic shock, unspecified, sequela 06/10/2019  . Rib fractures 06/05/2019  . Fall at home,  initial encounter 06/04/2019  . Right rib fracture 06/04/2019  . Lung contusion 06/04/2019  . Acute respiratory failure with hypoxia ([place]) 06/04/2019  . Anemia in ESRD (end-stage renal disease) ([place]) 06/04/2019  . GERD (gastroesophageal reflux disease) 06/04/2019  . Chronic diastolic (congestive) heart failure ([place]) 06/04/2019  . Fall 06/04/2019  . Thoracic ascending aortic aneurysm ([place]) 06/04/2019  . Acute exacerbation of congestive heart failure ([place]) 05/16/2019  . Carpal tunnel syndrome of right wrist 10/05/2018  . Ulnar neuropathy 10/05/2018  . Neuritis of right ulnar nerve 09/13/2018  . Pain in joint of right elbow 09/13/2018  . Chest pain 04/13/2018  . Chronic diastolic heart failure ([place]) 04/13/2018  . Acute on chronic diastolic CHF (congestive heart failure) ([place]) 04/13/2018  . Chest discomfort   . Atypical chest pain 02/16/2018  . ESRD on dialysis ([place]) 02/16/2018  . ESRD (end stage renal disease) on dialysis ([place]) 02/16/2018  . Volume overload 10/23/2017  . CAP (community acquired pneumonia) 10/23/2017  . Asthma, chronic, unspecified asthma severity, with acute exacerbation 10/23/2017  . Respiratory failure, acute ([place]) 10/23/2017  . Iron deficiency anemia, unspecified 09/09/2017  . Gram-negative sepsis, unspecified ([place]) 09/05/2017  . Cough 08/23/2017  . Encounter for immunization 07/08/2017  . Moderate protein-calorie malnutrition ([place]) 06/06/2017  . Coagulation defect, unspecified ([place]) 06/04/2017  . Hypokalemia 06/04/2017  . Other hyperlipidemia 06/04/2017  . Secondary hyperparathyroidism of renal origin ([place]) 06/04/2017  . Constipation   . Macrocytic anemia 05/24/2017  . Uremia 05/24/2017  . Hypertensive urgency 05/24/2017  . Acute kidney injury superimposed on CKD ([place]) 05/24/2017  . CKD (chronic kidney disease), stage V ([place]) 05/24/2017  . GI bleeding 05/24/2017  . Symptomatic anemia 05/24/2017  . Stroke ([name]) 2018  . AKI (acute kidney injury) ([place])  04/10/2015  . History of completed stroke   . Essential hypertension 04/09/2015    Past Surgical History:  Procedure Laterality Date  . ANTERIOR CERVICAL DECOMP/DISCECTOMY FUSION N/A 01/04/2020   Procedure: ANTERIOR CERVICAL DECOMPRESSION/DISCECTOMY FUSION CERVICAL FIVE THROUGH SEVEN;  Surgeon: Melina Schools, MD;  Location: [facility];  Service: Orthopedics;  Laterality: N/A;  3 hrs  . AV FISTULA PLACEMENT Left 05/28/2017   Procedure: LEFT ARM ARTERIOVENOUS (AV) FISTULA CREATION;  Surgeon: Conrad , MD;  Location: [facility];  Service: Vascular;  Laterality: Left;  . EYE SURGERY Right 06/02/2019   Cataract removed  . FRACTURE SURGERY    . IR DIALY SHUNT INTRO NEEDLE/INTRACATH INITIAL W/IMG LEFT Left 09/12/2020  . IR FLUORO GUIDE CV LINE RIGHT  05/25/2017  . IR US GUIDE VASC ACCESS LEFT  09/12/2020  . IR US GUIDE VASC ACCESS RIGHT  05/25/2017  . REVISON OF ARTERIOVENOUS FISTULA Left 07/18/2019   Procedure: REVISION PLICATION OF RADIOCEPHALIC ARTERIOVENOUS FISTULA LEFT ARM;  Surgeon: Angelia Mould, MD;  Location: [facility];  Service: Vascular;  Laterality: Left;  . RINOPLASTY         Family History  Problem Relation Age of Onset  . Cancer Mother  Social History   Tobacco Use  . Smoking status: Never Smoker  . Smokeless tobacco: Never Used  Vaping Use  . Vaping Use: Never used  Substance Use Topics  . Alcohol use: Not Currently    Alcohol/week: 0.0 standard drinks    Comment: has a drink once in a while- 12/30/19  . Drug use: No    Home Medications Prior to Admission medications   Medication Sig Start Date End Date Taking? Authorizing Provider  albuterol (VENTOLIN HFA) 108 (90 Base) MCG/ACT inhaler Inhale 2 puffs into the lungs every 6 (six) hours as needed for wheezing or shortness of breath.    Yes [provider]  amLODipine (NORVASC) 10 MG tablet Take 10 mg by mouth daily.    Yes [provider]  atorvastatin (LIPITOR) 20 MG tablet Take 20 mg by mouth  at bedtime. 04/19/20   [provider]  AURYXIA 1 GM 210 MG(Fe) tablet Take 420 mg by mouth 3 (three) times daily with meals.  03/16/20   [provider]  BREO ELLIPTA 100-25 MCG/INH AEPB Inhale 1 puff into the lungs daily. 06/26/20   [provider]  cholecalciferol (VITAMIN D3) 25 MCG (1000 UNIT) tablet Take 1,000 Units by mouth daily.    [provider]  cinacalcet (SENSIPAR) 60 MG tablet Take 60 mg by mouth daily.    [provider]  furosemide (LASIX) 40 MG tablet Take 1 tablet (40 mg total) by mouth See admin instructions. On Sunday, Monday, Wednesday, Thursday and Saturday 08/14/20   Tobb, Kardie, DO  gabapentin (NEURONTIN) 300 MG capsule Take 300 mg by mouth 2 (two) times daily as needed (pain).     [provider]  hydrALAZINE (APRESOLINE) 25 MG tablet Take 50 mg by mouth 3 (three) times daily.    [provider]  metoprolol tartrate (LOPRESSOR) 100 MG tablet Take 100 mg by mouth 2 (two) times daily.     [provider]  multivitamin (RENA-VIT) TABS tablet Take 1 tablet by mouth daily. 01/12/18   [provider]  sevelamer carbonate (RENVELA) 800 MG tablet Take 800 mg by mouth daily. 11/07/19   [provider]  Specialty Vitamins Products (HEALTHY HEART COMPLEX PO) Take 1 tablet by mouth daily.    [provider]    Allergies    Patient has no known allergies.  Review of Systems   Review of Systems  All other systems reviewed and are negative.   Physical Exam Updated Vital Signs BP (!) 160/81   Pulse 72   Temp 98.1 F (36.7 C) (Oral)   Resp 18   Ht 5\' 8"  (1.727 m)   Wt 60.7 kg   SpO2 99%   BMI 20.34 kg/m   Physical Exam Vitals and nursing note reviewed.  Constitutional:      Appearance: He is well-developed and well-nourished.  HENT:     Head: Normocephalic and atraumatic.     Mouth/Throat:     Mouth: Mucous membranes are moist.  Eyes:     Conjunctiva/sclera: Conjunctivae  normal.  Cardiovascular:     Rate and Rhythm: Normal rate.     Heart sounds: No murmur heard.   Pulmonary:     Effort: Pulmonary effort is normal. No respiratory distress.  Musculoskeletal:        General: Swelling and tenderness present. No edema.     Cervical back: Neck supple.     Comments: Swollen left arm,  Good pulse,  Palpable thrill  Skin:  General: Skin is warm and dry.  Neurological:     General: No focal deficit present.     Mental Status: He is alert.  Psychiatric:        Mood and Affect: Mood and affect and mood normal.     ED Results / Procedures / Treatments   Labs (all labs ordered are listed, but only abnormal results are displayed) Labs Reviewed - No data to display  EKG None  Radiology DG Chest [facility] 1 View  Result Date: 09/19/2020 CLINICAL DATA:  Shortness of breath EXAM: PORTABLE CHEST 1 VIEW COMPARISON:  09/18/2020 FINDINGS: Chest bilateral patchy interstitial and alveolar airspace opacities. No pleural effusion or pneumothorax. Stable cardiomegaly. No acute osseous abnormality. IMPRESSION: Bilateral patchy interstitial and alveolar airspace opacities concerning for multilobar pneumonia including atypical viral pneumonia. Electronically Signed   By: Kathreen Devoid   On: 09/19/2020 14:55   DG Chest Port 1 View  Result Date: 09/18/2020 CLINICAL DATA:  Shortness of breath beginning this morning. Nose bleed. CHF. Hypertension. EXAM: PORTABLE CHEST 1 VIEW COMPARISON:  09/09/2020 FINDINGS: Unchanged cardiomegaly the. New bilateral airspace opacities. Minimal pulmonary vascular congestion. IMPRESSION: New bilateral airspace opacities may be due to multifocal pneumonia or pulmonary edema. Electronically Signed   By: Miachel Roux M.D.   On: 09/18/2020 07:37    Procedures Procedures (including critical care time)  Medications Ordered in ED Medications - No data to display  ED Course  I have reviewed the triage vital signs and the nursing notes.  Pertinent  labs & imaging results that were available during my care of the patient were reviewed by me and considered in my medical decision making (see chart for details).    MDM Rules/Calculators/A&P                          MDM:  I spoke to Dr Jonnie Finner.  He advised pt has chronic swelling.  Pt is suppose to start dialysis at [facility] on Friday.  He reports they can set pt up for a fistulagram.  He advised pt [facility] go to [facility] tomorrow if he feels short of breath.  Final Clinical Impression(s) / ED Diagnoses Final diagnoses:  Left arm swelling    Rx / DC Orders ED Discharge Orders    None    An After Visit Summary was printed and given to the patient.    Sidney Ace 09/19/20 [place], Nathan, MD 09/20/20 [phone]

## 2020-09-20 ENCOUNTER — Telehealth: Payer: Self-pay

## 2020-09-20 DIAGNOSIS — R110 Nausea: Secondary | ICD-10-CM | POA: Insufficient documentation

## 2020-09-20 DIAGNOSIS — T7840XA Allergy, unspecified, initial encounter: Secondary | ICD-10-CM | POA: Insufficient documentation

## 2020-09-20 DIAGNOSIS — L299 Pruritus, unspecified: Secondary | ICD-10-CM | POA: Insufficient documentation

## 2020-09-20 DIAGNOSIS — R54 Age-related physical debility: Secondary | ICD-10-CM | POA: Insufficient documentation

## 2020-09-20 DIAGNOSIS — R52 Pain, unspecified: Secondary | ICD-10-CM | POA: Insufficient documentation

## 2020-09-20 NOTE — Telephone Encounter (Signed)
Renal Navigator spoke with Dr. Elta Guadeloupe Director at [facility] HD clinic who is willing to accept patient so he will not longer be required to receive HD through the ED as he has been doing for the past few months. Navigator sincerely appreciates MD's acceptance and spoke with Clinic Manager who can start patient in the clinic Monday with a MWF seat time of 12:15pm. He needs to arrive to the clinic at 11:00am on Monday for a meeting and to sign intake paperwork.  Navigator contacted patient to discuss. He is incredibly appreciative and stated this many times. Navigator relayed this to MD and clinic. Navigator encouraged patient to leave his past behind him and embrace this fresh start. He agrees. Navigator provided him with mental health resources and encouraged him to think about talking with someone regarding the experience he has been through. He feels he has gotten over his anger, but was agreeable to the resource. He has transportation to the clinic. He is concerned about his AVF as his arm is significantly swollen today. He was evaluated at [facility] yesterday and told to follow up with HD clinic. Navigator spoke with inpt Renal Team who advised to elevate and watch salt and fluid intake and to return to ED if necessary. This was passed on to patient, who again, states appreciation.  Navigator has submitted all necessary paperwork to [facility] from patient's most recent admission for start in the clinic on Monday, 09/24/20.  Thomas Robinson,  Renal Navigator [phone]

## 2020-09-21 NOTE — Telephone Encounter (Signed)
° °  Primary Cardiologist: [name], DO  Chart reviewed as part of pre-operative protocol coverage. Given past medical history and time since last visit, based on ACC/AHA guidelines, Thomas Robinson would be at acceptable risk for the planned procedure without further cardiovascular testing.   Per Dr. Harriet Masson: Patient is noncompliant with most medical therapy.  I will route this recommendation to the requesting party via Epic fax function and remove from pre-op pool.  Please call with questions.  Jossie Ng. Huy Majid NP-C    09/21/2020, 8:58 AM [address] Office [phone] Fax [phone]

## 2020-09-21 NOTE — Telephone Encounter (Signed)
No additional work up required - recent [place]. Please note to physician that patient is noncompliant with most medical therapy.

## 2020-09-27 IMAGING — CT CT CHEST W/O CM
2 of 4 series · 15 of 36 positions shown, 18 images · non-contrast
Comparison: 06/04/2019

CLINICAL DATA: Chest pain, left-sided swelling

EXAM:
CT CHEST WITHOUT CONTRAST
TECHNIQUE: Multidetector CT imaging of the chest was performed following the
standard protocol without IV contrast.

[Series 3: chest wo · axial · 0.76mm/px · z∈[+1055,+1341]mm · 12 of 169 slices shown, 15 images]
[im 13/169  mediastinal]
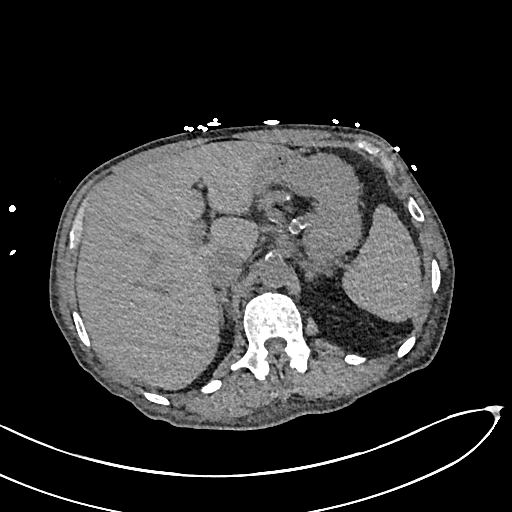
[im 13/169  lung]
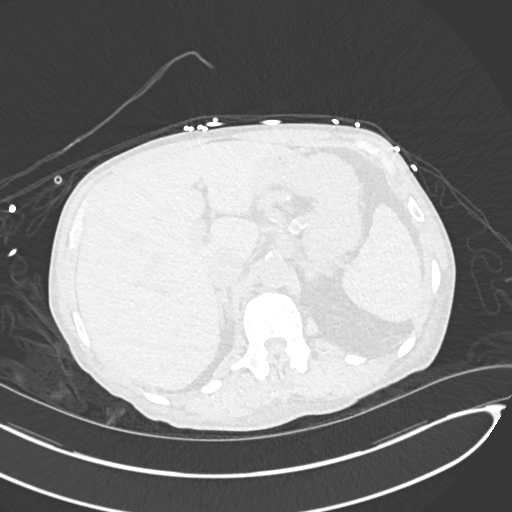
[im 26/169  lung]
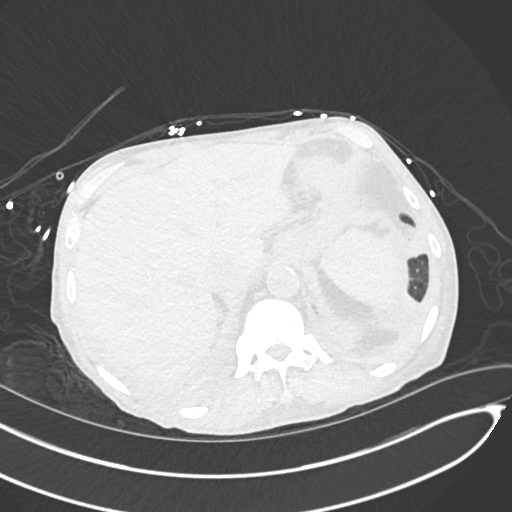
[im 39/169  lung]
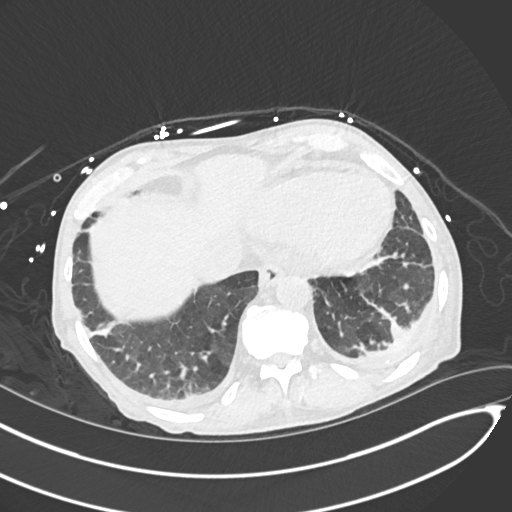
[im 52/169  lung]
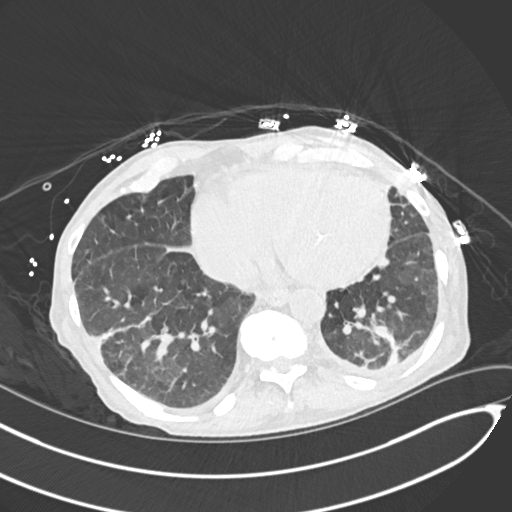
[im 65/169  mediastinal]
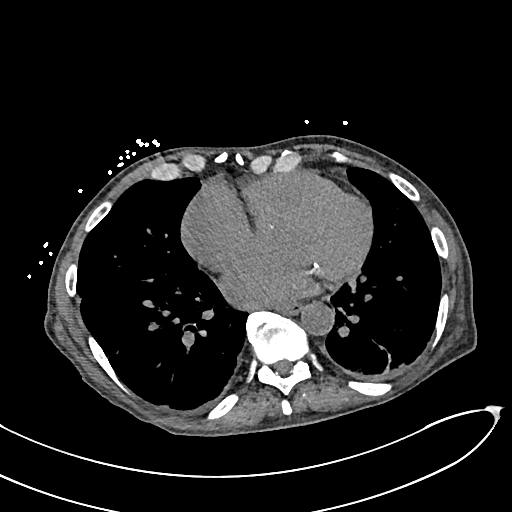
[im 65/169  lung]
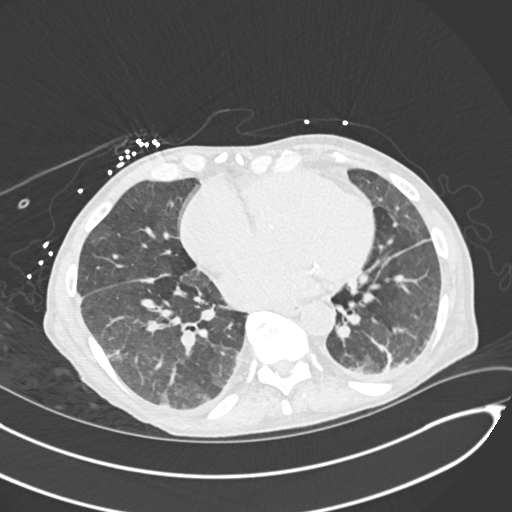
[im 78/169  lung]
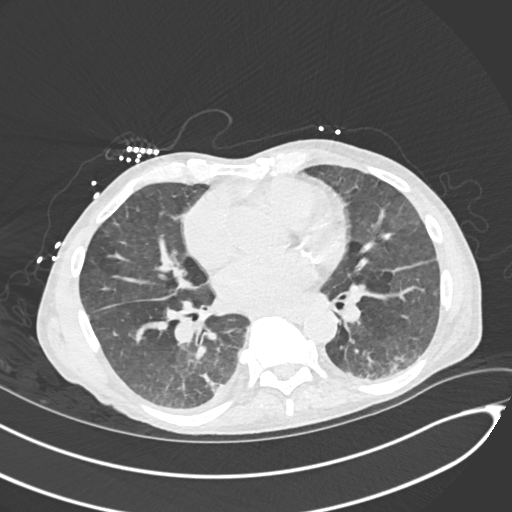
[im 91/169  lung]
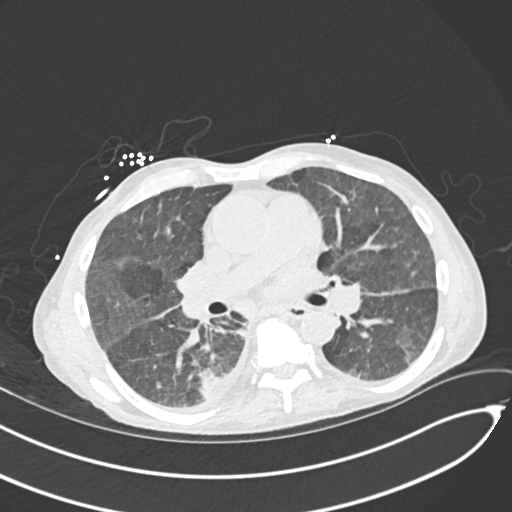
[im 104/169  lung]
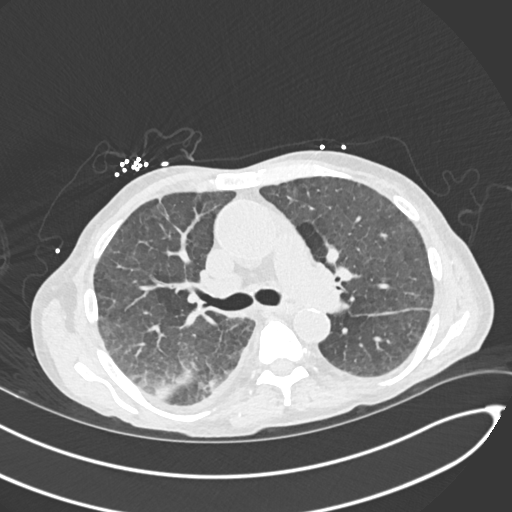
[im 117/169  mediastinal]
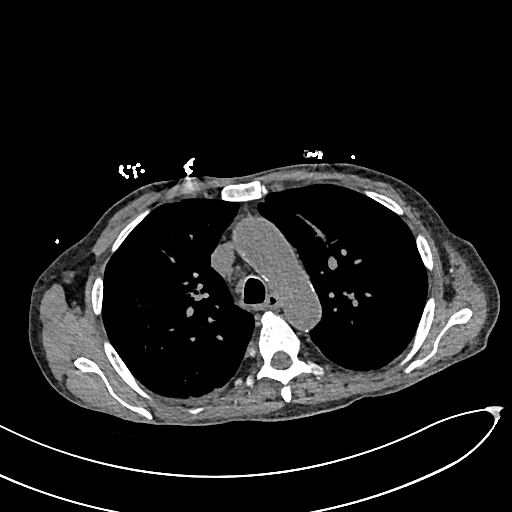
[im 117/169  lung]
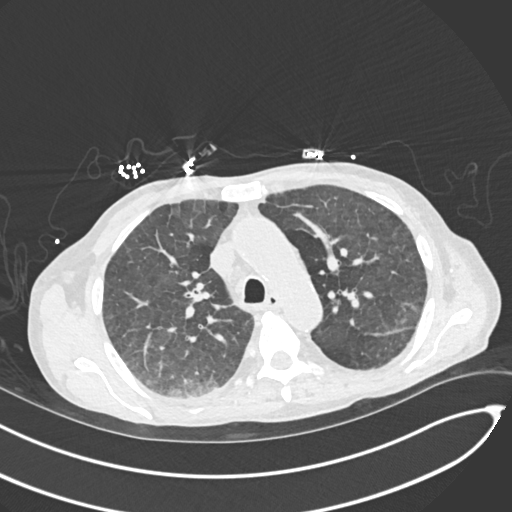
[im 130/169  lung]
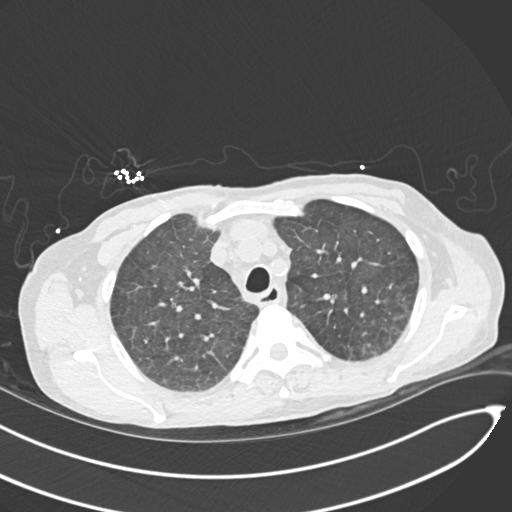
[im 143/169  lung]
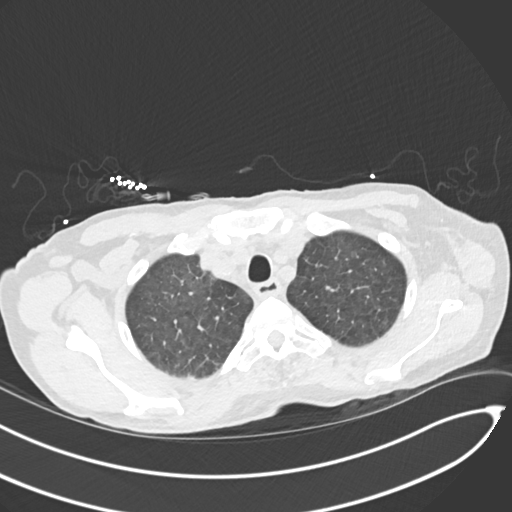
[im 156/169  lung]
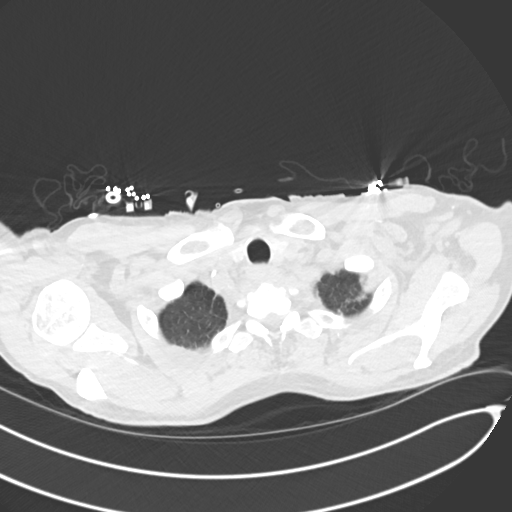

[Series 6: cor · coronal · 0.68mm/px · 3 of 128 slices shown]
[im 26/128  lung]
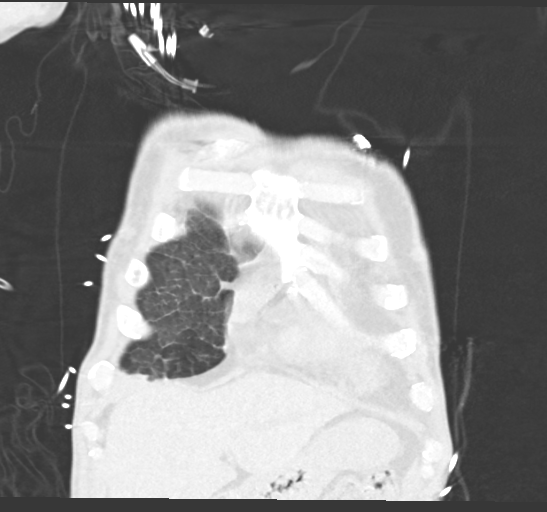
[im 51/128  lung]
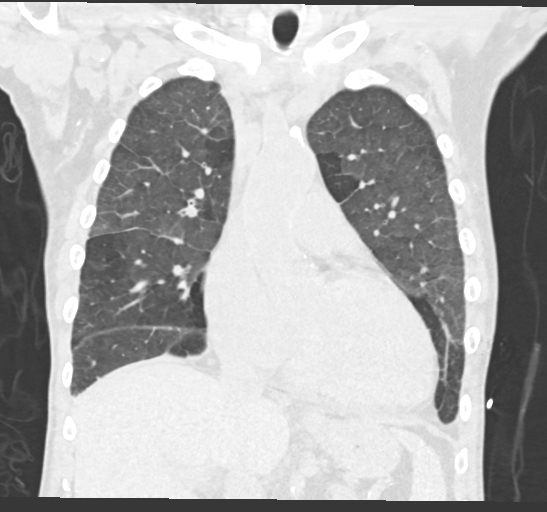
[im 77/128  lung]
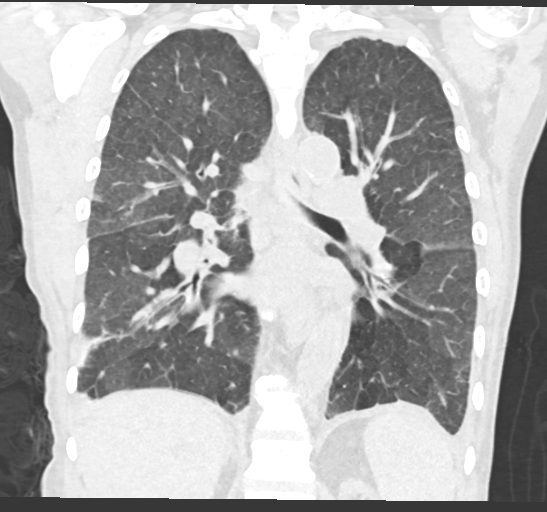

[15 of 36 positions shown; findings below may reference images not displayed]

FINDINGS: Cardiovascular: Evaluation of the heart and vascular structures is
limited without IV contrast. Aneurysmal dilatation of the ascending
thoracic aorta is again identified measuring 4.4 cm, not
significantly changed since prior study. Aortic arch and descending
thoracic aorta are normal in caliber.

The heart is mildly enlarged but stable. Trace pericardial fluid.
There is significant atherosclerosis of the aorta and coronary
vasculature.

Mediastinum/Nodes: Nonspecific mediastinal lymphadenopathy measures
up to 10 mm in the precarinal region, previously having measured 8
mm.

The thyroid, trachea, and esophagus are grossly unremarkable.

Lungs/Pleura: There is diffuse interlobular septal thickening with
central vascular congestion, consistent with interstitial edema.
Trace bilateral pleural effusions are noted. There is patchy
atelectasis or scarring at the lung bases.

Central airways are patent.

Upper Abdomen: No acute abnormality.

Musculoskeletal: No acute displaced fractures. Prior healed right
posterior ninth rib fracture. Reconstructed images demonstrate no
additional findings.
IMPRESSION: 1. Stable 4.4 cm ascending thoracic aortic aneurysm. Recommend
annual imaging followup by CTA or MRA. This recommendation follows
3313 ACCF/AHA/AATS/ACR/ASA/SCA/INTERIST ARIANIT/JENE/JUMPER/KIERAN Guidelines for the
Diagnosis and Management of Patients with Thoracic Aortic Disease.
Circulation. 3313; 121: E266-e369. Aortic aneurysm NOS ([id number].7)
2. Interstitial pulmonary edema, with trace bilateral pleural
effusions.
3. Nonspecific mediastinal lymphadenopathy, slightly increased.
4. Aortic Atherosclerosis ([id number].B).

## 2020-10-09 ENCOUNTER — Ambulatory Visit (HOSPITAL_COMMUNITY)
Admission: RE | Admit: 2020-10-09 | Discharge: 2020-10-09 | Disposition: A | Discharge status: Home / Self Care | Payer: Medicare Other | Source: Ambulatory Visit | Attending: Internal Medicine | Admitting: Internal Medicine

## 2020-10-09 ENCOUNTER — Other Ambulatory Visit: Payer: Self-pay

## 2020-10-09 DIAGNOSIS — D649 Anemia, unspecified: Secondary | ICD-10-CM | POA: Insufficient documentation

## 2020-10-09 LAB — PREPARE RBC (CROSSMATCH)

## 2020-10-09 MED ORDER — SODIUM CHLORIDE 0.9% IV SOLUTION: Freq: Once | INTRAVENOUS | Status: AC

## 2020-10-09 NOTE — Discharge Instructions (Signed)

## 2020-10-09 NOTE — Progress Notes (Addendum)
Patient received 1 unit of packed red blood cells via PIV as ordered by Corliss Parish  MD. Pt c/o fatigue today.Type and screen was done before transfusion. Consent for blood transfusion obtained from pt. Reviewed side effects with pt, verbalized understanding. Pre transfusion medications not required per orders. Post transfusion H&H not required per orders. Tolerated well, vitals stable, discharge instructions given, verbalized understanding. Patient alert, oriented and ambulatory at the time of discharge.

## 2020-10-10 LAB — TYPE AND SCREEN
ABO/RH(D): A POS
Antibody Screen: NEGATIVE
Unit division: 0

## 2020-10-10 LAB — BPAM RBC
Blood Product Expiration Date: 202203022359
ISSUE DATE / TIME: 202202081042
Unit Type and Rh: 6200

## 2020-10-13 IMAGING — CT CT ANGIO CHEST-ABD-PELV FOR DISSECTION W/ AND WO/W CM
2 of 7 series · 14 of 46 positions shown, 16 images · IV contrast (APPLIED)
Comparison: Chest CT 01/23/2020

CLINICAL DATA: No aortic aneurysm. End-stage renal disease on
hemodialysis for 3 years. Chest pain and back pain. Concern for
acute aortic dissection.

EXAM:
CT ANGIOGRAPHY CHEST, ABDOMEN AND PELVIS
TECHNIQUE: Non-contrast CT of the chest was initially obtained.

[Series 6: arterial · axial · arterial · 0.72mm/px · z∈[-722,-140]mm · 11 of 331 slices shown, 13 images]
[im 20/331  soft-tissue]
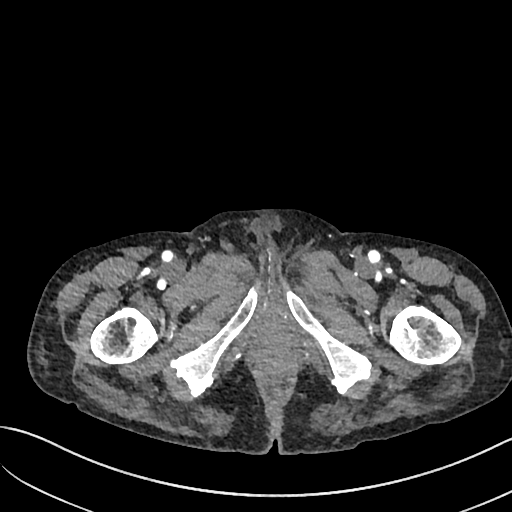
[im 20/331  bone]
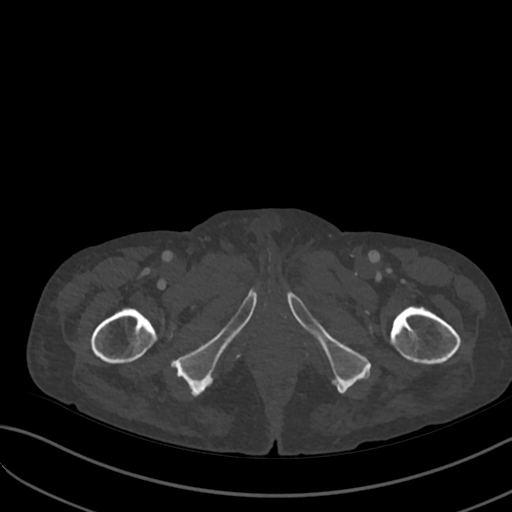
[im 59/331  soft-tissue]
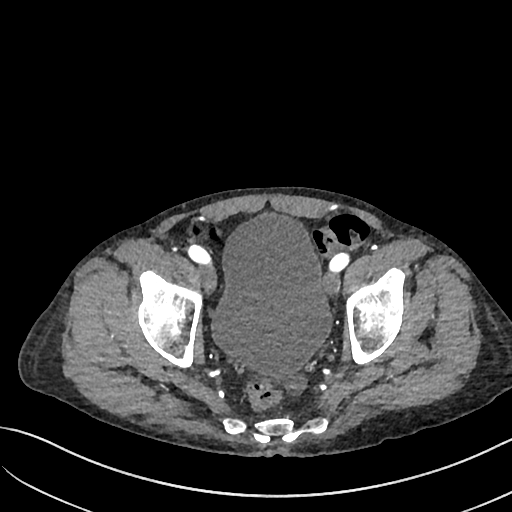
[im 78/331  soft-tissue]
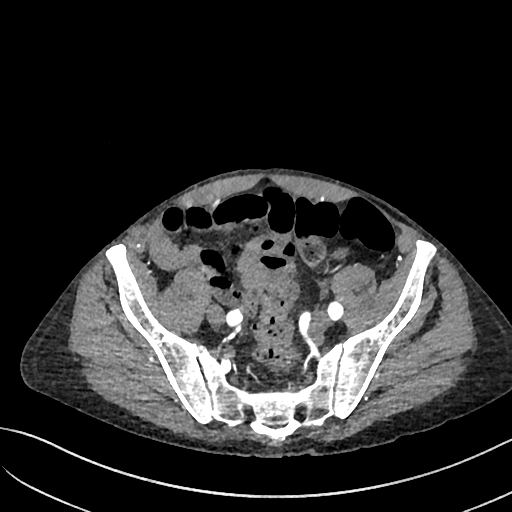
[im 117/331  soft-tissue]
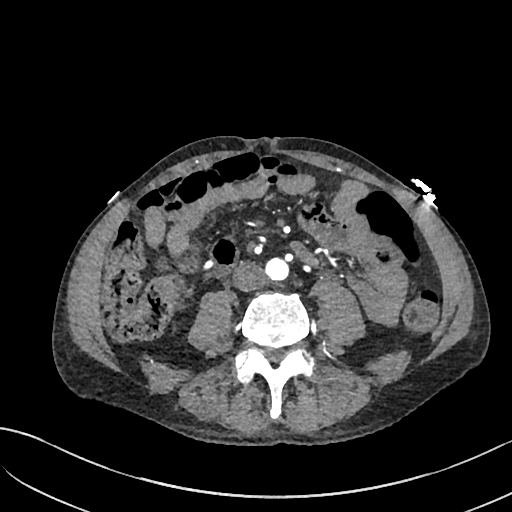
[im 136/331  soft-tissue]
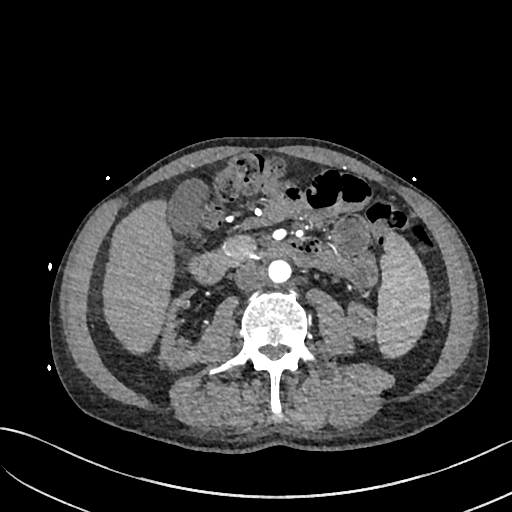
[im 175/331  soft-tissue]
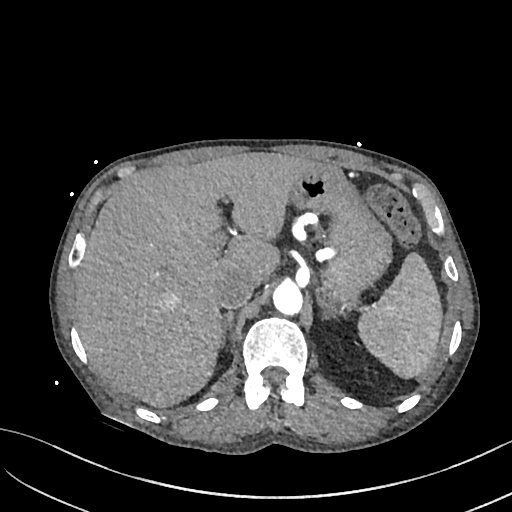
[im 195/331  soft-tissue]
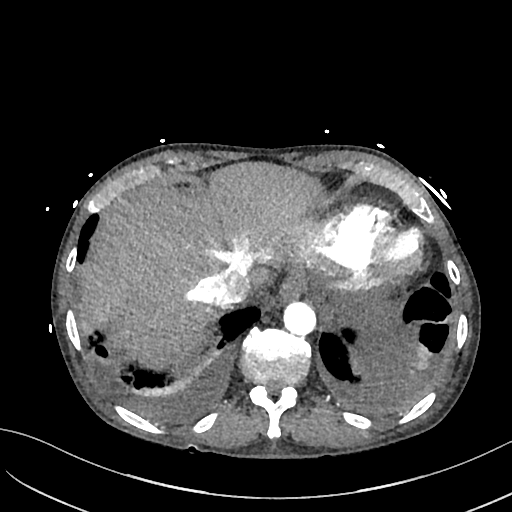
[im 214/331  soft-tissue]
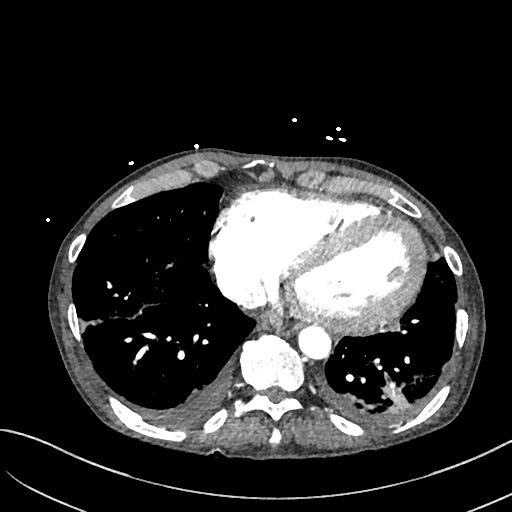
[im 253/331  soft-tissue]
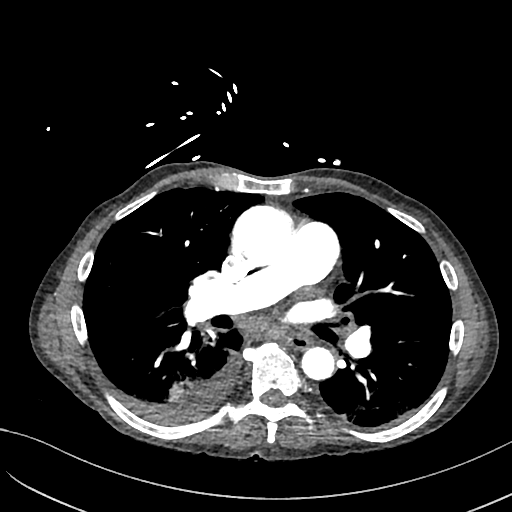
[im 253/331  bone]
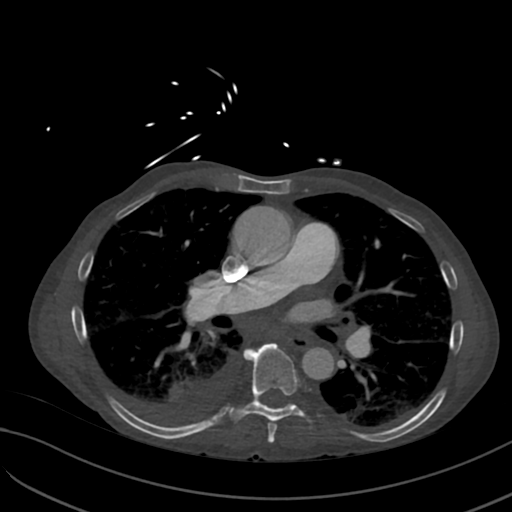
[im 272/331  soft-tissue]
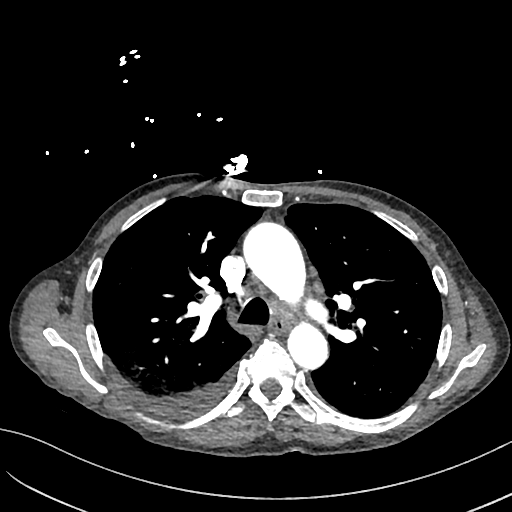
[im 311/331  soft-tissue]
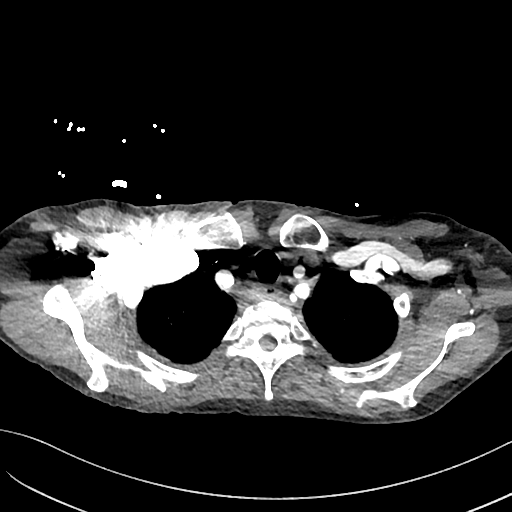

[Series 9: cor · coronal · 0.97mm/px · 3 of 123 slices shown]
[im 31/123  soft-tissue]
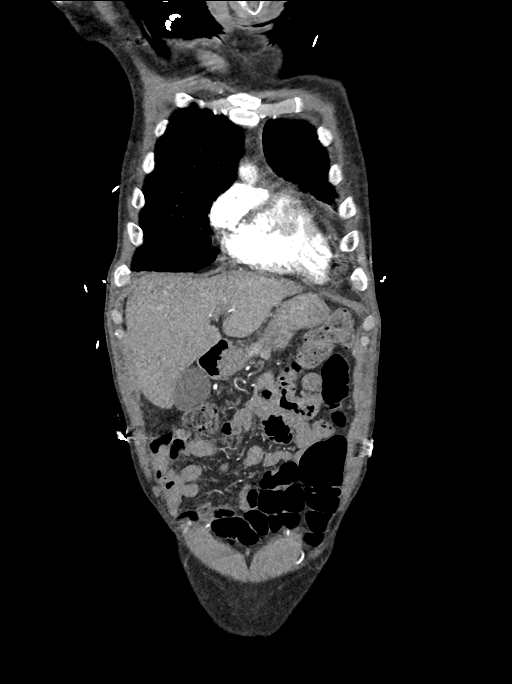
[im 62/123  soft-tissue]
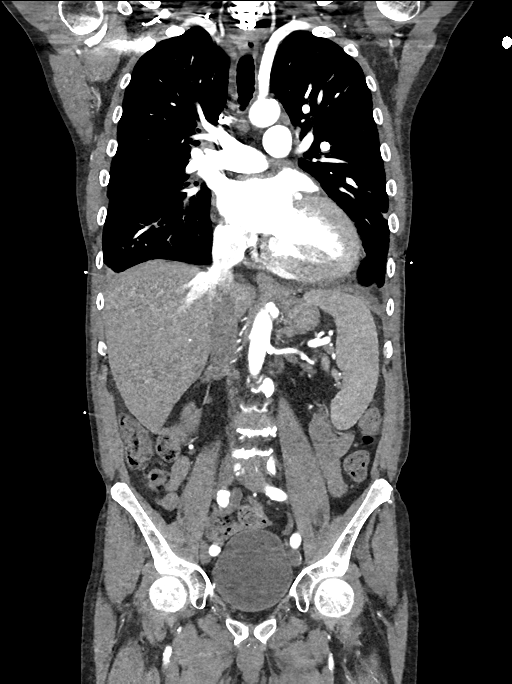
[im 92/123  soft-tissue]
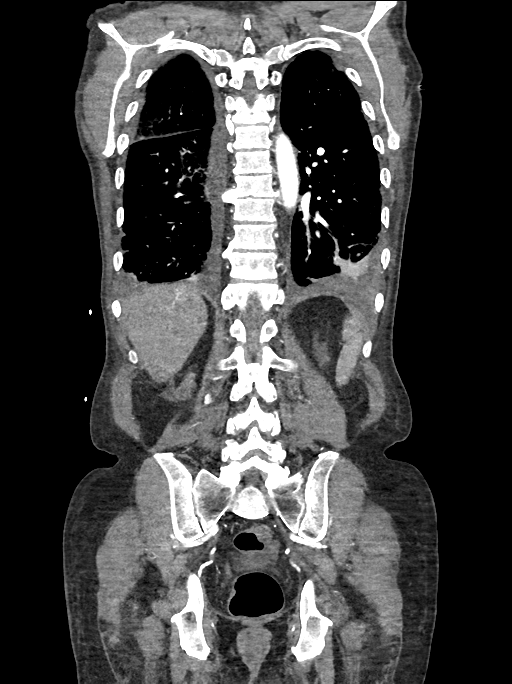

[14 of 46 positions shown; findings below may reference images not displayed]

Multidetector CT imaging through the chest, abdomen and pelvis was
performed using the standard protocol during bolus administration of
intravenous contrast. Multiplanar reconstructed images and MIPs were
obtained and reviewed to evaluate the vascular anatomy.

CONTRAST:  100mL OMNIPAQUE IOHEXOL 350 MG/ML SOLN
FINDINGS: CTA CHEST FINDINGS

Cardiovascular: Noncontrast imaging of the thorax demonstrates no
intramural hematoma within the thoracic aorta. Contrast enhanced
imaging demonstrates no dissection of the thoracic aorta. The
ascending thoracic aorta is upper limits of normal at 40 mm. Great
vessels are normal. Descending thoracic aorta is normal at 21 mm

Mediastinum/Nodes: No axillary supraclavicular adenopathy. No new
mediastinal hilar adenopathy no pericardial effusion. Esophagus
normal.

Lungs/Pleura: There is ground-glass opacities in the lower lobes as
well as upper lobes. There is greater density in the posterior
aspect of the RIGHT upper lobe (image 61/7). There is a moderate
RIGHT effusion. Peribronchial thickening in the lower lobes.

The ground-glass opacities which are diffuse are worsened from exam
01/23/2020. RIGHT pleural effusion is also worse.

Musculoskeletal: No aggressive osseous lesion healed posterior rib
fractures.

Review of the MIP images confirms the above findings.

CTA ABDOMEN AND PELVIS FINDINGS

VASCULAR

Aorta: No dissection in the abdominal aorta.

Celiac: Widely patent

SMA: Widely patent

Renals: Single renal artery on the LEFT. Two renal arteries on the
RIGHT.

IMA: Widely patent

Inflow: Normal

Veins: Normal

Review of the MIP images confirms the above findings.

NON-VASCULAR

Hepatobiliary: No focal hepatic lesion.

Pancreas: Normal pancreas

Spleen: Normal spleen

Adrenals/Urinary Tract: Adrenal glands normal. Kidneys are atrophic.
No significant renal enhancement on arterial phase.

Stomach/Bowel: Stomach, small bowel, appendix, and cecum are normal.
The colon and rectosigmoid colon are normal.

Lymphatic: No lymphadenopathy

Reproductive: Prostate unremarkable

Other: No abdominal hernia

Musculoskeletal: No aggressive osseous lesion

Review of the MIP images confirms the above findings.
IMPRESSION: Chest Impression:

1. No aortic dissection.
2. Ascending thoracic aorta borderline aneurysmal. Recommend annual
imaging followup by CTA or MRA. This recommendation follows 5707
ACCF/AHA/AATS/ACR/ASA/SCA/AVDULLA/NARENDRA/DAGOU/I NYOMAN Guidelines for the
Diagnosis and Management of Patients with Thoracic Aortic Disease.
Circulation. 5707; 121: [id number]. Aortic aneurysm NOS ([id number])
3. Increase in diffuse ground-glass opacities suggest pulmonary
edema pattern. Increased RIGHT pleural effusion. Some increased
ground-glass focality in the RIGHT lung is favored related to edema
however cannot exclude superimposed pulmonary infection.

Abdomen / Pelvis Impression:

1. No evidence of aortic dissection.
2. No acute findings in the abdomen pelvis.

## 2020-10-13 IMAGING — DX DG CHEST 1V PORT
1 series · 1 of 1 positions shown · non-contrast
Comparison: January 23, 2020

CLINICAL DATA: Shortness of breath.

EXAM:
PORTABLE CHEST 1 VIEW

[chest]
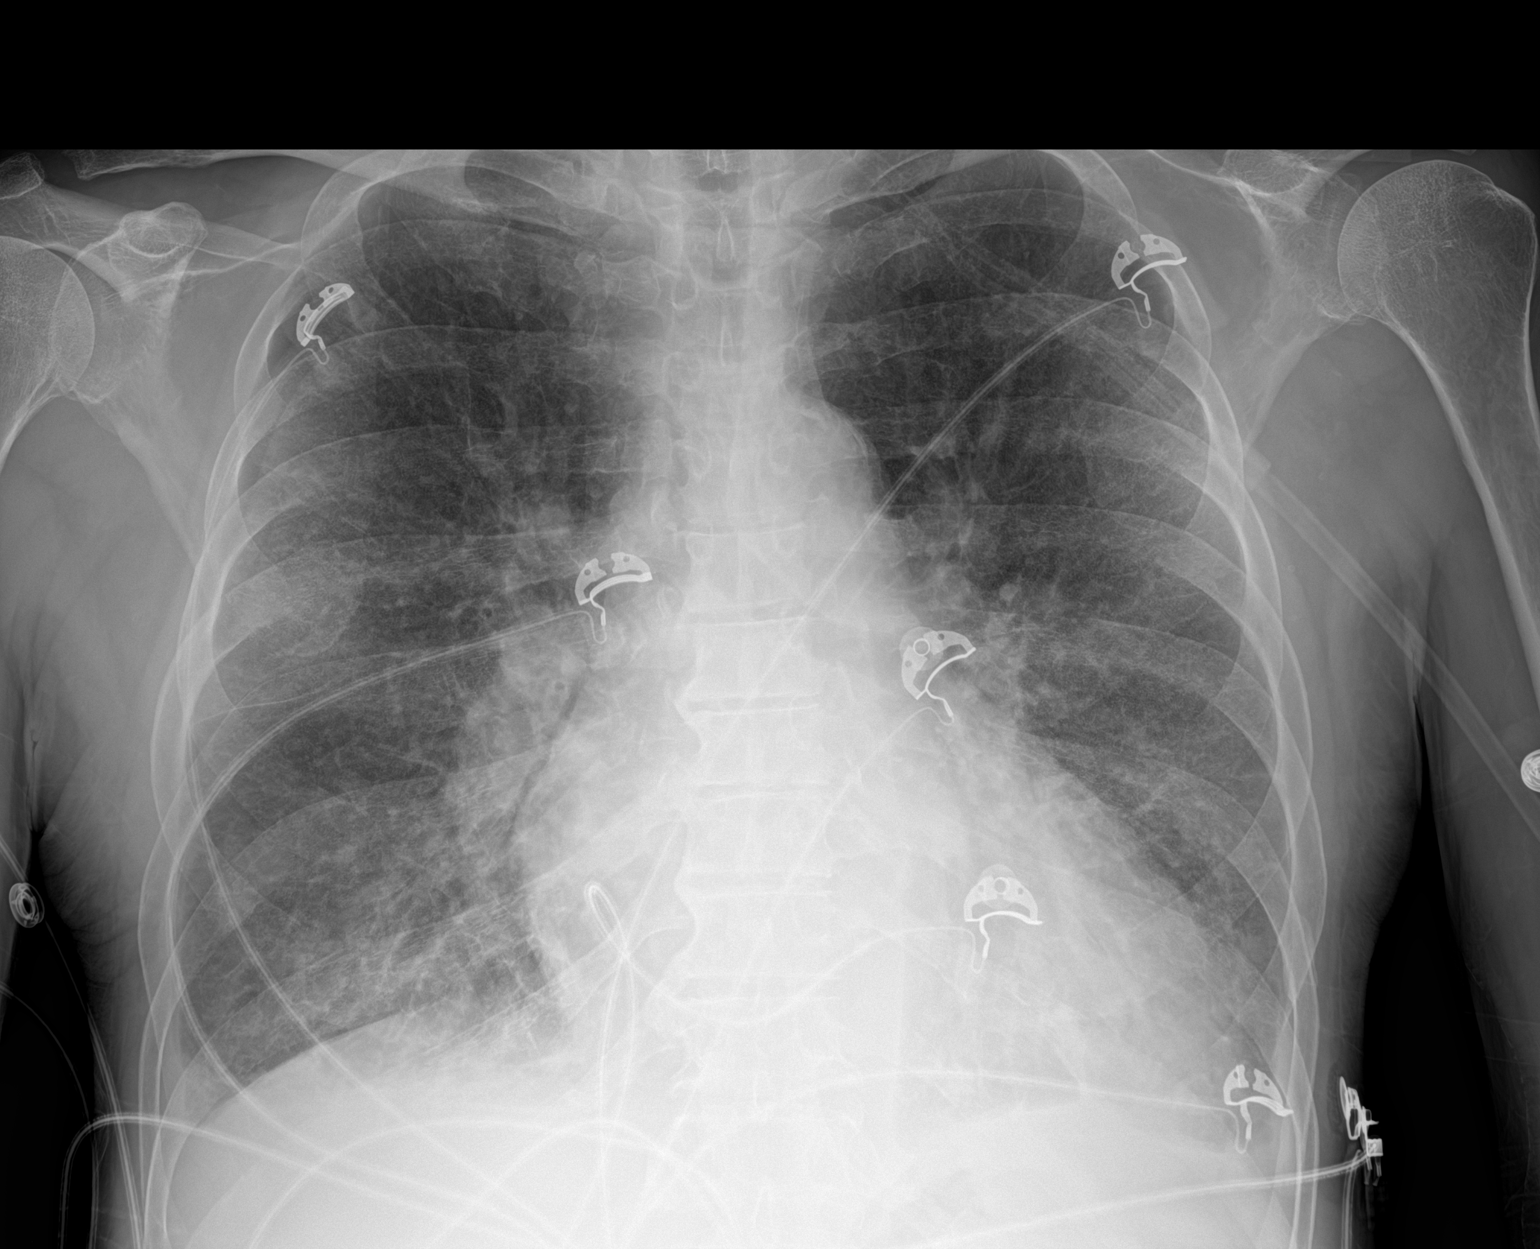

[1 of 1 positions shown; findings below may reference images not displayed]

FINDINGS: Mild, diffuse chronic appearing increased lung markings are seen.
Mild atelectasis is seen within the bilateral lung bases. There is
no evidence of a pleural effusion or pneumothorax. The cardiac
silhouette is mildly enlarged and unchanged in size. A radiopaque
fusion plate and screws are seen overlying the lower cervical spine.
The visualized skeletal structures are unremarkable.
IMPRESSION: Stable cardiomegaly with chronic appearing increased lung markings.
A mild superimposed component of interstitial edema cannot be
excluded.

## 2020-11-06 ENCOUNTER — Other Ambulatory Visit: Payer: Self-pay | Admitting: Orthopaedic Surgery

## 2020-11-06 DIAGNOSIS — M47896 Other spondylosis, lumbar region: Secondary | ICD-10-CM

## 2020-11-17 ENCOUNTER — Other Ambulatory Visit: Payer: Self-pay

## 2020-11-17 ENCOUNTER — Ambulatory Visit
Admission: RE | Admit: 2020-11-17 | Discharge: 2020-11-17 | Disposition: A | Discharge status: Home / Self Care | Payer: Medicare Other | Source: Ambulatory Visit | Attending: Orthopaedic Surgery | Admitting: Orthopaedic Surgery

## 2020-11-17 DIAGNOSIS — M47896 Other spondylosis, lumbar region: Secondary | ICD-10-CM

## 2020-12-27 ENCOUNTER — Encounter: Payer: Self-pay | Admitting: Physician Assistant

## 2020-12-27 ENCOUNTER — Other Ambulatory Visit: Payer: Self-pay

## 2020-12-27 ENCOUNTER — Ambulatory Visit (INDEPENDENT_AMBULATORY_CARE_PROVIDER_SITE_OTHER): Payer: Medicare Other | Admitting: Physician Assistant

## 2020-12-27 VITALS — BP 158/74 | HR 99 | Temp 98.0°F | Resp 20 | Ht 68.0 in | Wt 128.8 lb

## 2020-12-27 DIAGNOSIS — T82898A Other specified complication of vascular prosthetic devices, implants and grafts, initial encounter: Secondary | ICD-10-CM | POA: Diagnosis not present

## 2020-12-27 DIAGNOSIS — N186 End stage renal disease: Secondary | ICD-10-CM

## 2020-12-27 DIAGNOSIS — Z992 Dependence on renal dialysis: Secondary | ICD-10-CM

## 2020-12-27 NOTE — Progress Notes (Signed)
DIALYSIS ACCESS OFFICE NOTE    CC: Left forearm and hand edema x2 months  HPI:  This is a 60 y.o. male who is s/p left radiocephalic AV fistula creation by Dr. Geryl Councilman in 2018.  He has done well since fistula creation.  He did have plication by Dr. Doren Custard in 2020.  He now presents with persistent edema despite cephalic vein angioplasty on October 25, 2020 and again on April eighth 2022 by Dr. Augustin Coupe.  The patient complains of hand tightness, pitting edema and itching of the skin from the elbow to the hand.  The swelling limits his mobility.  He denies hand pain or numbness.   Dialysis days: Mondays, Wednesdays and Fridays [facility]ialysis center: [facility]  No Known Allergies  Current Outpatient Medications  Medication Sig Dispense Refill  . acetaminophen (OFIRMEV) 10 MG/ML SOLN Take by mouth.    [name] albuterol (VENTOLIN HFA) 108 (90 Base) MCG/ACT inhaler Inhale 2 puffs into the lungs every 6 (six) hours as needed for wheezing or shortness of breath.     [name] amLODipine (NORVASC) 10 MG tablet Take 10 mg by mouth daily.     [name] atorvastatin (LIPITOR) 20 MG tablet Take 20 mg by mouth at bedtime.    Lorin Picket 1 GM 210 MG(Fe) tablet Take 420 mg by mouth 3 (three) times daily with meals.     [name] BREO ELLIPTA 100-25 MCG/INH AEPB Inhale 1 puff into the lungs daily.    . cholecalciferol (VITAMIN D3) 25 MCG (1000 UNIT) tablet Take 1,000 Units by mouth daily.    . cinacalcet (SENSIPAR) 60 MG tablet Take 60 mg by mouth daily.    [name] DIPHENHYDRAMINE HCL PO Take by mouth.    . doxercalciferol (HECTOROL) 0.5 MCG capsule Doxercalciferol (Hectorol)    . furosemide (LASIX) 40 MG tablet Take 1 tablet (40 mg total) by mouth See admin instructions. On Sunday, Monday, Wednesday, Thursday and Saturday 60 tablet 2  . gabapentin (NEURONTIN) 300 MG capsule Take 300 mg by mouth 2 (two) times daily as needed (pain).     . heparin 1000 unit/mL SOLN injection Heparin Sodium (Porcine) 1,000 Units/mL Systemic    . hydrALAZINE (APRESOLINE) 25 MG  tablet Take 50 mg by mouth 3 (three) times daily.    . Methoxy PEG-Epoetin Beta (MIRCERA IJ) Mircera    . metoprolol tartrate (LOPRESSOR) 100 MG tablet Take 100 mg by mouth 2 (two) times daily.     . multivitamin (RENA-VIT) TABS tablet Take 1 tablet by mouth daily.    . pregabalin (LYRICA) 25 MG capsule Take 25 mg by mouth 2 (two) times daily.    . sevelamer carbonate (RENVELA) 800 MG tablet Take 800 mg by mouth daily.    [name] Specialty Vitamins Products (HEALTHY HEART COMPLEX PO) Take 1 tablet by mouth daily.    . traMADol (ULTRAM) 50 MG tablet Take 50 mg by mouth every 8 (eight) hours as needed.     No current facility-administered medications for this visit.     ROS:  See HPI  There were no vitals taken for this visit.   Physical Exam:  General appearance: Alert and oriented in no apparent distress Cardiac: Rate and rhythm are regular Respiratory: Nonlabored Extremities: Left upper extremity is evaluated.  He has 1+ pitting edema of the left hand.  Good bruit and thrill in fistula.  Several small scabbed areas over puncture sites without skin ulceration or aneurysmal dilatation.  5/5 hand grip strength.  Left hand sensation and motor function intact.  Fistulogram procedure notes by Dr. Augustin Coupe reviewed.  Assessment/Plan:   Persistent left forearm and hand edema limiting mobility and causing skin irritation times approximately 2 months.  No improvement despite angioplasty of cephalic vein by Dr. Augustin Coupe. Recommend fistulogram with next available surgeon on nondialysis day.  Barbie Banner, PA-C 12/27/2020 9:15 AM [facility] [phone]  Clinic MD: Dr. Oneida Alar

## 2020-12-27 NOTE — H&P (View-Only) (Signed)
DIALYSIS ACCESS OFFICE NOTE    CC: Left forearm and hand edema x2 months  HPI:  This is a 60 y.o. male who is s/p left radiocephalic AV fistula creation by Dr. Geryl Councilman in 2018.  He has done well since fistula creation.  He did have plication by Dr. Doren Custard in 2020.  He now presents with persistent edema despite cephalic vein angioplasty on October 25, 2020 and again on April eighth 2022 by Dr. Augustin Coupe.  The patient complains of hand tightness, pitting edema and itching of the skin from the elbow to the hand.  The swelling limits his mobility.  He denies hand pain or numbness.   Dialysis days: Mondays, Wednesdays and Fridays Dialysis center: [facility]  No Known Allergies  Current Outpatient Medications  Medication Sig Dispense Refill  . acetaminophen (OFIRMEV) 10 MG/ML SOLN Take by mouth.    [name] albuterol (VENTOLIN HFA) 108 (90 Base) MCG/ACT inhaler Inhale 2 puffs into the lungs every 6 (six) hours as needed for wheezing or shortness of breath.     [name] amLODipine (NORVASC) 10 MG tablet Take 10 mg by mouth daily.     [name] atorvastatin (LIPITOR) 20 MG tablet Take 20 mg by mouth at bedtime.    Lorin Picket 1 GM 210 MG(Fe) tablet Take 420 mg by mouth 3 (three) times daily with meals.     [name] BREO ELLIPTA 100-25 MCG/INH AEPB Inhale 1 puff into the lungs daily.    . cholecalciferol (VITAMIN D3) 25 MCG (1000 UNIT) tablet Take 1,000 Units by mouth daily.    . cinacalcet (SENSIPAR) 60 MG tablet Take 60 mg by mouth daily.    [name] DIPHENHYDRAMINE HCL PO Take by mouth.    . doxercalciferol (HECTOROL) 0.5 MCG capsule Doxercalciferol (Hectorol)    . furosemide (LASIX) 40 MG tablet Take 1 tablet (40 mg total) by mouth See admin instructions. On Sunday, Monday, Wednesday, Thursday and Saturday 60 tablet 2  . gabapentin (NEURONTIN) 300 MG capsule Take 300 mg by mouth 2 (two) times daily as needed (pain).     . heparin 1000 unit/mL SOLN injection Heparin Sodium (Porcine) 1,000 Units/mL Systemic    . hydrALAZINE (APRESOLINE) 25 MG  tablet Take 50 mg by mouth 3 (three) times daily.    . Methoxy PEG-Epoetin Beta (MIRCERA IJ) Mircera    . metoprolol tartrate (LOPRESSOR) 100 MG tablet Take 100 mg by mouth 2 (two) times daily.     . multivitamin (RENA-VIT) TABS tablet Take 1 tablet by mouth daily.    . pregabalin (LYRICA) 25 MG capsule Take 25 mg by mouth 2 (two) times daily.    . sevelamer carbonate (RENVELA) 800 MG tablet Take 800 mg by mouth daily.    [name] Specialty Vitamins Products (HEALTHY HEART COMPLEX PO) Take 1 tablet by mouth daily.    . traMADol (ULTRAM) 50 MG tablet Take 50 mg by mouth every 8 (eight) hours as needed.     No current facility-administered medications for this visit.     ROS:  See HPI  There were no vitals taken for this visit.   Physical Exam:  General appearance: Alert and oriented in no apparent distress Cardiac: Rate and rhythm are regular Respiratory: Nonlabored Extremities: Left upper extremity is evaluated.  He has 1+ pitting edema of the left hand.  Good bruit and thrill in fistula.  Several small scabbed areas over puncture sites without skin ulceration or aneurysmal dilatation.  5/5 hand grip strength.  Left hand sensation and motor function intact.  Fistulogram procedure notes by Dr. Augustin Coupe reviewed.  Assessment/Plan:   Persistent left forearm and hand edema limiting mobility and causing skin irritation times approximately 2 months.  No improvement despite angioplasty of cephalic vein by Dr. Augustin Coupe. Recommend fistulogram with next available surgeon on nondialysis day.  Barbie Banner, PA-C 12/27/2020 9:15 AM [facility] [phone]  Clinic MD: Dr. Oneida Alar

## 2020-12-28 ENCOUNTER — Other Ambulatory Visit (HOSPITAL_COMMUNITY)
Admission: RE | Admit: 2020-12-28 | Discharge: 2020-12-28 | Disposition: A | Discharge status: Home / Self Care | Payer: Medicare Other | Source: Ambulatory Visit | Attending: Surgery | Admitting: Surgery

## 2020-12-28 DIAGNOSIS — Z20822 Contact with and (suspected) exposure to covid-19: Secondary | ICD-10-CM | POA: Diagnosis not present

## 2020-12-28 DIAGNOSIS — Z01812 Encounter for preprocedural laboratory examination: Secondary | ICD-10-CM | POA: Diagnosis present

## 2020-12-28 LAB — SARS CORONAVIRUS 2 (TAT 6-24 HRS): SARS Coronavirus 2: NEGATIVE

## 2021-01-01 ENCOUNTER — Other Ambulatory Visit: Payer: Self-pay

## 2021-01-01 ENCOUNTER — Ambulatory Visit (HOSPITAL_COMMUNITY)
Admission: RE | Admit: 2021-01-01 | Discharge: 2021-01-01 | Disposition: A | Discharge status: Home / Self Care | Payer: Medicare Other | Attending: Surgery | Admitting: Surgery

## 2021-01-01 ENCOUNTER — Encounter (HOSPITAL_COMMUNITY): Payer: Self-pay | Admitting: Surgery

## 2021-01-01 ENCOUNTER — Encounter (HOSPITAL_COMMUNITY)
Admission: RE | Disposition: A | Discharge status: Home / Self Care | Payer: Self-pay | Source: Home / Self Care | Attending: Surgery

## 2021-01-01 DIAGNOSIS — N186 End stage renal disease: Secondary | ICD-10-CM | POA: Insufficient documentation

## 2021-01-01 DIAGNOSIS — T82510A Breakdown (mechanical) of surgically created arteriovenous fistula, initial encounter: Secondary | ICD-10-CM | POA: Diagnosis present

## 2021-01-01 DIAGNOSIS — Z7951 Long term (current) use of inhaled steroids: Secondary | ICD-10-CM | POA: Insufficient documentation

## 2021-01-01 DIAGNOSIS — Z79899 Other long term (current) drug therapy: Secondary | ICD-10-CM | POA: Diagnosis not present

## 2021-01-01 DIAGNOSIS — M7989 Other specified soft tissue disorders: Secondary | ICD-10-CM | POA: Insufficient documentation

## 2021-01-01 DIAGNOSIS — Y841 Kidney dialysis as the cause of abnormal reaction of the patient, or of later complication, without mention of misadventure at the time of the procedure: Secondary | ICD-10-CM | POA: Diagnosis not present

## 2021-01-01 DIAGNOSIS — Z992 Dependence on renal dialysis: Secondary | ICD-10-CM | POA: Insufficient documentation

## 2021-01-01 DIAGNOSIS — N185 Chronic kidney disease, stage 5: Secondary | ICD-10-CM | POA: Diagnosis not present

## 2021-01-01 DIAGNOSIS — T82898A Other specified complication of vascular prosthetic devices, implants and grafts, initial encounter: Secondary | ICD-10-CM | POA: Diagnosis not present

## 2021-01-01 HISTORY — PX: A/V FISTULAGRAM: CATH118298

## 2021-01-01 LAB — POCT I-STAT, CHEM 8
BUN: 43 mg/dL — ABNORMAL HIGH (ref 8–23)
Calcium, Ion: 1.13 mmol/L — ABNORMAL LOW (ref 1.15–1.40)
Chloride: 100 mmol/L (ref 98–111)
Creatinine, Ser: 5.8 mg/dL — ABNORMAL HIGH (ref 0.61–1.24)
Glucose, Bld: 80 mg/dL (ref 70–99)
HCT: 30 % — ABNORMAL LOW (ref 39.0–52.0)
Hemoglobin: 10.2 g/dL — ABNORMAL LOW (ref 13.0–17.0)
Potassium: 5.3 mmol/L — ABNORMAL HIGH (ref 3.5–5.1)
Sodium: 137 mmol/L (ref 135–145)
TCO2: 29 mmol/L (ref 22–32)

## 2021-01-01 SURGERY — A/V FISTULAGRAM
Anesthesia: LOCAL

## 2021-01-01 MED ORDER — IODIXANOL 320 MG/ML IV SOLN
INTRAVENOUS | Status: DC | PRN
  Administered 2021-01-01: 25 mL

## 2021-01-01 MED ORDER — SODIUM CHLORIDE 0.9% FLUSH: 3.0000 mL | Freq: Two times a day (BID) | INTRAVENOUS | Status: DC

## 2021-01-01 MED ORDER — LIDOCAINE HCL (PF) 1 % IJ SOLN
INTRAMUSCULAR | Status: AC
  Filled 2021-01-01: qty 30

## 2021-01-01 MED ORDER — MIDAZOLAM HCL 2 MG/2ML IJ SOLN
INTRAMUSCULAR | Status: AC
  Filled 2021-01-01: qty 2

## 2021-01-01 MED ORDER — HEPARIN (PORCINE) IN NACL 1000-0.9 UT/500ML-% IV SOLN
INTRAVENOUS | Status: AC
  Filled 2021-01-01: qty 500

## 2021-01-01 MED ORDER — SODIUM CHLORIDE 0.9% FLUSH: 3.0000 mL | INTRAVENOUS | Status: DC | PRN

## 2021-01-01 MED ORDER — MIDAZOLAM HCL 2 MG/2ML IJ SOLN
INTRAMUSCULAR | Status: DC | PRN
  Administered 2021-01-01: 1 mg via INTRAVENOUS

## 2021-01-01 MED ORDER — HEPARIN (PORCINE) IN NACL 1000-0.9 UT/500ML-% IV SOLN
INTRAVENOUS | Status: DC | PRN
  Administered 2021-01-01: 500 mL

## 2021-01-01 MED ORDER — FENTANYL CITRATE (PF) 100 MCG/2ML IJ SOLN
INTRAMUSCULAR | Status: DC | PRN
  Administered 2021-01-01: 25 ug via INTRAVENOUS

## 2021-01-01 MED ORDER — FENTANYL CITRATE (PF) 100 MCG/2ML IJ SOLN
INTRAMUSCULAR | Status: AC
  Filled 2021-01-01: qty 2

## 2021-01-01 MED ORDER — LIDOCAINE HCL (PF) 1 % IJ SOLN
INTRAMUSCULAR | Status: DC | PRN
  Administered 2021-01-01: 2 mL via INTRADERMAL

## 2021-01-01 MED ORDER — SODIUM CHLORIDE 0.9 % IV SOLN: 250.0000 mL | INTRAVENOUS | Status: DC | PRN

## 2021-01-01 SURGICAL SUPPLY — 8 items
COVER DOME SNAP 22 D (MISCELLANEOUS) ×2 IMPLANT
KIT MICROPUNCTURE NIT STIFF (SHEATH) ×2 IMPLANT
PROTECTION STATION PRESSURIZED (MISCELLANEOUS) ×2
SHEATH PROBE COVER 6X72 (BAG) ×2 IMPLANT
STATION PROTECTION PRESSURIZED (MISCELLANEOUS) ×1 IMPLANT
STOPCOCK MORSE 400PSI 3WAY (MISCELLANEOUS) ×2 IMPLANT
TRAY PV CATH (CUSTOM PROCEDURE TRAY) ×2 IMPLANT
TUBING CIL FLEX 10 FLL-RA (TUBING) ×2 IMPLANT

## 2021-01-01 NOTE — Discharge Instructions (Signed)
Dialysis Fistulogram, Care After The following information offers guidance on how to care for yourself after your procedure. Your health care provider may also give you more specific instructions. If you have problems or questions, contact your health care provider. What can I expect after the procedure? After the procedure, it is common to have:  A small amount of discomfort in the area where the small tube (catheter) was placed for the procedure.  A small amount of bruising around the fistula.  Sleepiness and tiredness (fatigue). Follow these instructions at home: Puncture site care  Follow instructions from your health care provider about how to take care of the site where catheters were inserted. Make sure you: ? Wash your hands with soap and water for at least 20 seconds before and after you change your bandage (dressing). If soap and water are not available, use hand sanitizer. ? Change your dressing as told by your health care provider. ? Leave stitches (sutures), skin glue, or adhesive strips in place. These skin closures may need to stay in place for 2 weeks or longer. If adhesive strip edges start to loosen and curl up, you may trim the loose edges. Do not remove adhesive strips completely unless your health care provider tells you to do that.  Check your puncture area every day for signs of infection. Check for: ? More redness, swelling, or pain. ? Fluid or blood. ? Warmth. ? Pus or a bad smell.   Activity  Rest as much as you can.  If you were given a sedative during the procedure, it can affect you for several hours. Do not drive or operate machinery until your health care provider says that it is safe.  Do not lift anything that is heavier than 5 lb (2.3 kg), or the limit that you are told, on the day of your procedure.  Do not do anything strenuous with your arm for the rest of the day. Avoid household activities, such as vacuuming.  Return to your normal activities as  told by your health care provider. Ask your health care provider what activities are safe for you. Safety To prevent damage to your graft or fistula:  Do not wear tight-fitting clothing or jewelry on the arm or leg that has your graft or fistula.  Tell all your health care providers that you have a dialysis fistula or graft.  Do not allow blood draws, IVs, or blood pressure readings to be done in the arm that has your fistula or graft.  Do not allow flu shots or vaccinations in the arm with your fistula or graft. General instructions  Take over-the-counter and prescription medicines only as told by your health care provider.  Do not take baths, swim, or use a hot tub until your health care provider approves. Ask your health care provider if you may take showers. You may only be allowed to take sponge baths.  Monitor your dialysis fistula closely. Check to make sure that you can feel a vibration or buzz (a thrill) when you put your fingers over the fistula.  Keep all follow-up visits. This is important. Contact a health care provider if:  You have more redness, swelling, or pain at the site where the catheter was put in.  You have fluid or blood coming from the catheter site.  You have pus or a bad smell coming from the catheter site.  Your catheter site feels warm.  You have a fever or chills. Get help right away if:    You have bleeding from the vascular access site that does not stop.  You feel weak.  You have trouble balancing.  You have trouble moving your arms or legs.  You have problems with your speech or vision.  You can no longer feel a vibration or buzz when you put your fingers over your fistula.  The limb that was used for the procedure swells or becomes painful, cold, blue, or pale white.  You have chest pain or shortness of breath. These symptoms may represent a serious problem that is an emergency. Do not wait to see if the symptoms will go away. Get  medical help right away. Call your local emergency services (911 in the [place] Do not drive yourself to the hospital. Summary  After a dialysis fistulogram, it is common to have a small amount of discomfort or bruising in the area where the small, thin tube (catheter) was placed.  Rest as much as you can after your procedure. Return to your normal activities as told by your health care provider.  Take over-the-counter and prescription medicines only as told by your health care provider.  Follow instructions from your health care provider about how to take care of the site where the catheter was inserted.  Keep all follow-up visits. This is important. This information is not intended to replace advice given to you by your health care provider. Make sure you discuss any questions you have with your health care provider. Document Revised: 03/28/2020 Document Reviewed: 03/28/2020 Elsevier Patient Education  2021 Elsevier Inc.  

## 2021-01-01 NOTE — Op Note (Signed)
    Patient name: Kelcy Baeten MRN: [id number] DOB: [birth year] Sex: male  01/01/2021 Pre-operative Diagnosis: ESRD Post-operative diagnosis:  Same Surgeon:  [name] Procedure Performed:  1.  U/S guided access, left cephalic vein fistula  2.  fistulogram  3.  Conscious sedation, 10 minutes   Indications:  Patient has swelling from his arm crease down.  He is here fore fistulogram  Procedure:  The patient was identified in the holding area and taken to room 8.  The patient was then placed supine on the table and prepped and draped in the usual sterile fashion.  A time out was called.  Conscious sedation was administered with the use of IV fentanyl and Versed under continuous physician and nurse monitoring.  Heart rate, blood pressure, and oxygen saturations were continuously monitored.  Total sedation time was 10 minutes ultrasound was used to evaluate the fistula.  The vein was patent and compressible.  A digital ultrasound image was acquired.  The fistula was then accessed under ultrasound guidance using a micropuncture needle.  An 018 wire was then asvanced without resistance and a micropuncture sheath was placed.  Contrast injections were then performed through the sheath.  Findings:  No central stenosis.  The fistula is patent without stenosis.   Intervention:  none  Impression:  #1  Widely patent fistula  #2  Consider conversion to BCF for symptom control   V. [name], M.D., [facility] [place] Office: [phone] Pager:  [phone]

## 2021-01-01 NOTE — Interval H&P Note (Signed)
History and Physical Interval Note:  01/01/2021 7:12 AM  Thomas Robinson  has presented today for surgery, with the diagnosis of complication of AVF.  The various methods of treatment have been discussed with the patient and family. After consideration of risks, benefits and other options for treatment, the patient has consented to  Procedure(s): A/V FISTULAGRAM (N/A) as a surgical intervention.  The patient's history has been reviewed, patient examined, no change in status, stable for surgery.  I have reviewed the patient's chart and labs.  Questions were answered to the patient's satisfaction.     [name]

## 2021-01-07 ENCOUNTER — Ambulatory Visit (INDEPENDENT_AMBULATORY_CARE_PROVIDER_SITE_OTHER): Payer: Medicare Other | Admitting: Surgery

## 2021-01-07 ENCOUNTER — Other Ambulatory Visit: Payer: Self-pay

## 2021-01-07 ENCOUNTER — Encounter: Payer: Self-pay | Admitting: Surgery

## 2021-01-07 VITALS — BP 174/89 | HR 84 | Temp 99.0°F | Resp 20 | Ht 68.0 in | Wt 130.0 lb

## 2021-01-07 DIAGNOSIS — N186 End stage renal disease: Secondary | ICD-10-CM | POA: Diagnosis not present

## 2021-01-07 DIAGNOSIS — Z992 Dependence on renal dialysis: Secondary | ICD-10-CM

## 2021-01-07 NOTE — H&P (View-Only) (Signed)
[facility] [place]  Patient name: Thomas Robinson MRN: [id number] DOB: 1961/02/26 Sex: male   REASON FOR VISIT:    Follow up  [place]:    Thomas Robinson is a 60 y.o. male who is status post left radiocephalic fistula creation by Dr. Vallarie Mare in 2018.  He had plication of the fistula in 2020 by Dr. Scot Dock.  He has had persistent edema to the left forearm.  He has undergone cephalic vein angioplasty by Dr. Maxie Better 2 times in 2022.  He continues to have tightness, swelling and pain from his elbow to the hand.  I recently performed a fistulogram which showed no obvious stenoses.  Patient is here today saying that this is no longer tolerable secondary to the pain and swelling.   PAST MEDICAL HISTORY:   Past Medical History:  Diagnosis Date  . Anaphylactic shock, unspecified, sequela 06/10/2019  . Arthritis   . Asthma, chronic, unspecified asthma severity, with acute exacerbation 10/23/2017  . CAP (community acquired pneumonia) 10/23/2017  . Carpal tunnel syndrome of right wrist 10/05/2018  . Cataract    right - removed by surgery  . Cerebral infarction due to thrombosis of cerebral artery ([place])   . Cervical disc herniation 01/04/2020  . Chronic diastolic heart failure ([place]) 04/13/2018  . Chronic low back pain 11/24/2019  . Constipation   . Cough    chronic cough  . Encounter for immunization 07/08/2017  . ESRD on hemodialysis ([place]) 02/16/2018   Dialysis  T Thus Sat  - [facility]   . Fall 06/04/2019  . GERD (gastroesophageal reflux disease) 06/04/2019  . GI bleeding 05/24/2017  . Gram-negative sepsis, unspecified ([place]) 09/05/2017  . History of fusion of cervical spine 03/26/2020  . Hyperlipidemia   . Hypertension   . Hypokalemia 06/04/2017  . Iron deficiency anemia, unspecified 09/09/2017  . Left shoulder pain 11/11/2019  . Lumbar radiculopathy 11/24/2019  . Lung contusion 06/04/2019  . LVH (left ventricular  hypertrophy) due to hypertensive disease, with heart failure ([place]) 06/18/2020  . Macrocytic anemia 05/24/2017  . Moderate protein-calorie malnutrition ([place]) 06/06/2017  . Myofascial pain syndrome 06/12/2020  . Neuritis of right ulnar nerve 09/13/2018  . Non-compliance with renal dialysis ([place]) 02/08/2020  . Oxygen deficiency 12/30/2019   O2 sats on RA 87% at PAT appt   . Pain in joint of right elbow 09/13/2018  . Renovascular hypertension 06/22/2020  . Restless leg syndrome   . Rib fractures 06/2019   Right  . Secondary hyperparathyroidism of renal origin ([place]) 06/04/2017  . Thoracic ascending aortic aneurysm ([facility])    4.5 cm 06/04/19 CT  . Ulnar neuropathy 10/05/2018  . Volume overload 10/23/2017     FAMILY HISTORY:   Family History  Problem Relation Age of Onset  . Cancer Mother     SOCIAL HISTORY:   Social History   Tobacco Use  . Smoking status: Never Smoker  . Smokeless tobacco: Never Used  Substance Use Topics  . Alcohol use: Not Currently    Alcohol/week: 0.0 standard drinks    Comment: has a drink once in a while- 12/30/19     ALLERGIES:   No Known Allergies   CURRENT MEDICATIONS:   Current Outpatient Medications  Medication Sig Dispense Refill  . albuterol (VENTOLIN HFA) 108 (90 Base) MCG/ACT inhaler Inhale 2 puffs into the lungs every 6 (six) hours as needed for wheezing or shortness of breath.     [name] amLODipine (NORVASC) 10  MG tablet Take 10 mg by mouth daily.     [name] atorvastatin (LIPITOR) 20 MG tablet Take 20 mg by mouth at bedtime.    Lorin Picket 1 GM 210 MG(Fe) tablet Take 420 mg by mouth 3 (three) times daily with meals.     [name] BREO ELLIPTA 100-25 MCG/INH AEPB Inhale 1 puff into the lungs daily.    . cholecalciferol (VITAMIN D3) 25 MCG (1000 UNIT) tablet Take 1,000 Units by mouth daily.    . cinacalcet (SENSIPAR) 60 MG tablet Take 60 mg by mouth daily.    [name] DIPHENHYDRAMINE HCL PO Take by mouth.    . doxercalciferol (HECTOROL) 0.5 MCG capsule Doxercalciferol  (Hectorol)    . furosemide (LASIX) 40 MG tablet Take 1 tablet (40 mg total) by mouth See admin instructions. On Sunday, Monday, Wednesday, Thursday and Saturday 60 tablet 2  . gabapentin (NEURONTIN) 300 MG capsule Take 300 mg by mouth 2 (two) times daily as needed (pain).     . heparin 1000 unit/mL SOLN injection Heparin Sodium (Porcine) 1,000 Units/mL Systemic    . hydrALAZINE (APRESOLINE) 25 MG tablet Take 50 mg by mouth 3 (three) times daily.    . Methoxy PEG-Epoetin Beta (MIRCERA IJ) Mircera    . metoprolol tartrate (LOPRESSOR) 100 MG tablet Take 100 mg by mouth 2 (two) times daily.     . multivitamin (RENA-VIT) TABS tablet Take 1 tablet by mouth daily.    . pregabalin (LYRICA) 25 MG capsule Take 25 mg by mouth 2 (two) times daily.    . sevelamer carbonate (RENVELA) 800 MG tablet Take 800 mg by mouth daily.    [name] Specialty Vitamins Products (HEALTHY HEART COMPLEX PO) Take 1 tablet by mouth daily.    . traMADol (ULTRAM) 50 MG tablet Take 50 mg by mouth every 8 (eight) hours as needed.     No current facility-administered medications for this visit.    REVIEW OF SYSTEMS:   [X]  denotes positive finding, [ ]  denotes negative finding Cardiac  Comments:  Chest pain or chest pressure:    Shortness of breath upon exertion:    Short of breath when lying flat:    Irregular heart rhythm:        Vascular    Pain in calf, thigh, or hip brought on by ambulation:    Pain in feet at night that wakes you up from your sleep:     Blood clot in your veins:    Leg swelling:         Pulmonary    Oxygen at home:    Productive cough:     Wheezing:         Neurologic    Sudden weakness in arms or legs:     Sudden numbness in arms or legs:     Sudden onset of difficulty speaking or slurred speech:    Temporary loss of vision in one eye:     Problems with dizziness:         Gastrointestinal    Blood in stool:     Vomited blood:         Genitourinary    Burning when urinating:     Blood in  urine:        Psychiatric    Major depression:         Hematologic    Bleeding problems:    Problems with blood clotting too easily:        Skin    Rashes  or ulcers:        Constitutional    Fever or chills:      PHYSICAL EXAM:   Vitals:   01/07/21 1430  BP: (!) 174/89  Pulse: 84  Resp: 20  Temp: 99 F (37.2 C)  SpO2: 90%  Weight: 130 lb (59 kg)  Height: 5\' 8"  (1.727 m)    GENERAL: The patient is a well-nourished male, in no acute distress. The vital signs are documented above. CARDIAC: There is a regular rate and rhythm.  VASCULAR: Excellent thrill within the cephalic vein.  There is edema from the elbow down to the fingertips. PULMONARY: Non-labored respirations MUSCULOSKELETAL: There are no major deformities or cyanosis. NEUROLOGIC: No focal weakness or paresthesias are detected. SKIN: There are no ulcers or rashes noted. PSYCHIATRIC: The patient has a normal affect.  STUDIES:   I reviewed his recent fistulogram and discussed it with the patient  MEDICAL ISSUES:   End-stage renal disease: I discussed with the patient that I suspect that the edema since it is from the elbow down has something to do with the outflow to his radiocephalic fistula.  I propose converting this to a brachiocephalic fistula and ligating the radiocephalic fistula.  I think this should alleviate his symptoms.  I did state that there is a possibility that his symptoms may persist.  I will wrap his hand and arm up to the elbow with Ace wrap and compression after the procedure.  He is adamant to not have a catheter.  I think his cephalic vein in the upper arm has matured enough to immediately stick this and avoid a catheter.  This is been scheduled for this Thursday, May 12    Leia Alf, MD, [facility] [phone] Pager [phone]

## 2021-01-07 NOTE — Progress Notes (Signed)
Vascular and Vein Specialist of [place]  Patient name: Thomas Robinson MRN: [id number] DOB: [birth year] Sex: male   REASON FOR VISIT:    Follow up  [place]:    Thomas Robinson is a 60 y.o. male who is status post left radiocephalic fistula creation by Dr. Vallarie Mare in 2018.  He had plication of the fistula in 2020 by Dr. Scot Dock.  He has had persistent edema to the left forearm.  He has undergone cephalic vein angioplasty by Dr. Maxie Better 2 times in 2022.  He continues to have tightness, swelling and pain from his elbow to the hand.  I recently performed a fistulogram which showed no obvious stenoses.  Patient is here today saying that this is no longer tolerable secondary to the pain and swelling.   PAST MEDICAL HISTORY:   Past Medical History:  Diagnosis Date  . Anaphylactic shock, unspecified, sequela 06/10/2019  . Arthritis   . Asthma, chronic, unspecified asthma severity, with acute exacerbation 10/23/2017  . CAP (community acquired pneumonia) 10/23/2017  . Carpal tunnel syndrome of right wrist 10/05/2018  . Cataract    right - removed by surgery  . Cerebral infarction due to thrombosis of cerebral artery ([place])   . Cervical disc herniation 01/04/2020  . Chronic diastolic heart failure ([place]) 04/13/2018  . Chronic low back pain 11/24/2019  . Constipation   . Cough    chronic cough  . Encounter for immunization 07/08/2017  . ESRD on hemodialysis ([place]) 02/16/2018   Dialysis  T Thus Sat  - [facility]   . Fall 06/04/2019  . GERD (gastroesophageal reflux disease) 06/04/2019  . GI bleeding 05/24/2017  . Gram-negative sepsis, unspecified ([place]) 09/05/2017  . History of fusion of cervical spine 03/26/2020  . Hyperlipidemia   . Hypertension   . Hypokalemia 06/04/2017  . Iron deficiency anemia, unspecified 09/09/2017  . Left shoulder pain 11/11/2019  . Lumbar radiculopathy 11/24/2019  . Lung contusion 06/04/2019  . LVH (left ventricular  hypertrophy) due to hypertensive disease, with heart failure ([place]) 06/18/2020  . Macrocytic anemia 05/24/2017  . Moderate protein-calorie malnutrition ([place]) 06/06/2017  . Myofascial pain syndrome 06/12/2020  . Neuritis of right ulnar nerve 09/13/2018  . Non-compliance with renal dialysis ([place]) 02/08/2020  . Oxygen deficiency 12/30/2019   O2 sats on RA 87% at PAT appt   . Pain in joint of right elbow 09/13/2018  . Renovascular hypertension 06/22/2020  . Restless leg syndrome   . Rib fractures 06/2019   Right  . Secondary hyperparathyroidism of renal origin ([place]) 06/04/2017  . Thoracic ascending aortic aneurysm (HCC)    4.5 cm 06/04/19 CT  . Ulnar neuropathy 10/05/2018  . Volume overload 10/23/2017     FAMILY HISTORY:   Family History  Problem Relation Age of Onset  . Cancer Mother     SOCIAL HISTORY:   Social History   Tobacco Use  . Smoking status: Never Smoker  . Smokeless tobacco: Never Used  Substance Use Topics  . Alcohol use: Not Currently    Alcohol/week: 0.0 standard drinks    Comment: has a drink once in a while- 12/30/19     ALLERGIES:   No Known Allergies   CURRENT MEDICATIONS:   Current Outpatient Medications  Medication Sig Dispense Refill  . albuterol (VENTOLIN HFA) 108 (90 Base) MCG/ACT inhaler Inhale 2 puffs into the lungs every 6 (six) hours as needed for wheezing or shortness of breath.     [name] amLODipine (NORVASC) 10  MG tablet Take 10 mg by mouth daily.     [name] atorvastatin (LIPITOR) 20 MG tablet Take 20 mg by mouth at bedtime.    Lorin Picket 1 GM 210 MG(Fe) tablet Take 420 mg by mouth 3 (three) times daily with meals.     [name] BREO ELLIPTA 100-25 MCG/INH AEPB Inhale 1 puff into the lungs daily.    . cholecalciferol (VITAMIN D3) 25 MCG (1000 UNIT) tablet Take 1,000 Units by mouth daily.    . cinacalcet (SENSIPAR) 60 MG tablet Take 60 mg by mouth daily.    [name] DIPHENHYDRAMINE HCL PO Take by mouth.    . doxercalciferol (HECTOROL) 0.5 MCG capsule Doxercalciferol  (Hectorol)    . furosemide (LASIX) 40 MG tablet Take 1 tablet (40 mg total) by mouth See admin instructions. On Sunday, Monday, Wednesday, Thursday and Saturday 60 tablet 2  . gabapentin (NEURONTIN) 300 MG capsule Take 300 mg by mouth 2 (two) times daily as needed (pain).     . heparin 1000 unit/mL SOLN injection Heparin Sodium (Porcine) 1,000 Units/mL Systemic    . hydrALAZINE (APRESOLINE) 25 MG tablet Take 50 mg by mouth 3 (three) times daily.    . Methoxy PEG-Epoetin Beta (MIRCERA IJ) Mircera    . metoprolol tartrate (LOPRESSOR) 100 MG tablet Take 100 mg by mouth 2 (two) times daily.     . multivitamin (RENA-VIT) TABS tablet Take 1 tablet by mouth daily.    . pregabalin (LYRICA) 25 MG capsule Take 25 mg by mouth 2 (two) times daily.    . sevelamer carbonate (RENVELA) 800 MG tablet Take 800 mg by mouth daily.    [name] Specialty Vitamins Products (HEALTHY HEART COMPLEX PO) Take 1 tablet by mouth daily.    . traMADol (ULTRAM) 50 MG tablet Take 50 mg by mouth every 8 (eight) hours as needed.     No current facility-administered medications for this visit.    REVIEW OF SYSTEMS:   [X]  denotes positive finding, [ ]  denotes negative finding Cardiac  Comments:  Chest pain or chest pressure:    Shortness of breath upon exertion:    Short of breath when lying flat:    Irregular heart rhythm:        Vascular    Pain in calf, thigh, or hip brought on by ambulation:    Pain in feet at night that wakes you up from your sleep:     Blood clot in your veins:    Leg swelling:         Pulmonary    Oxygen at home:    Productive cough:     Wheezing:         Neurologic    Sudden weakness in arms or legs:     Sudden numbness in arms or legs:     Sudden onset of difficulty speaking or slurred speech:    Temporary loss of vision in one eye:     Problems with dizziness:         Gastrointestinal    Blood in stool:     Vomited blood:         Genitourinary    Burning when urinating:     Blood in  urine:        Psychiatric    Major depression:         Hematologic    Bleeding problems:    Problems with blood clotting too easily:        Skin    Rashes  or ulcers:        Constitutional    Fever or chills:      PHYSICAL EXAM:   Vitals:   01/07/21 1430  BP: (!) 174/89  Pulse: 84  Resp: 20  Temp: 99 F (37.2 C)  SpO2: 90%  Weight: 130 lb (59 kg)  Height: 5\' 8"  (1.727 m)    GENERAL: The patient is a well-nourished male, in no acute distress. The vital signs are documented above. CARDIAC: There is a regular rate and rhythm.  VASCULAR: Excellent thrill within the cephalic vein.  There is edema from the elbow down to the fingertips. PULMONARY: Non-labored respirations MUSCULOSKELETAL: There are no major deformities or cyanosis. NEUROLOGIC: No focal weakness or paresthesias are detected. SKIN: There are no ulcers or rashes noted. PSYCHIATRIC: The patient has a normal affect.  STUDIES:   I reviewed his recent fistulogram and discussed it with the patient  MEDICAL ISSUES:   End-stage renal disease: I discussed with the patient that I suspect that the edema since it is from the elbow down has something to do with the outflow to his radiocephalic fistula.  I propose converting this to a brachiocephalic fistula and ligating the radiocephalic fistula.  I think this should alleviate his symptoms.  I did state that there is a possibility that his symptoms may persist.  I will wrap his hand and arm up to the elbow with Ace wrap and compression after the procedure.  He is adamant to not have a catheter.  I think his cephalic vein in the upper arm has matured enough to immediately stick this and avoid a catheter.  This is been scheduled for this Thursday, May 12    Leia Alf, MD, [facility] Vascular and Vein Specialists of [facility] [phone] Pager [phone]

## 2021-01-08 ENCOUNTER — Other Ambulatory Visit (HOSPITAL_COMMUNITY)
Admission: RE | Admit: 2021-01-08 | Discharge: 2021-01-08 | Disposition: A | Discharge status: Home / Self Care | Payer: Medicare Other | Source: Ambulatory Visit | Attending: Surgery | Admitting: Surgery

## 2021-01-08 DIAGNOSIS — Z01812 Encounter for preprocedural laboratory examination: Secondary | ICD-10-CM | POA: Insufficient documentation

## 2021-01-08 DIAGNOSIS — Z20822 Contact with and (suspected) exposure to covid-19: Secondary | ICD-10-CM | POA: Diagnosis not present

## 2021-01-08 LAB — SARS CORONAVIRUS 2 (TAT 6-24 HRS): SARS Coronavirus 2: NEGATIVE

## 2021-01-09 ENCOUNTER — Encounter (HOSPITAL_COMMUNITY): Payer: Self-pay | Admitting: Surgery

## 2021-01-09 NOTE — Anesthesia Preprocedure Evaluation (Deleted)
Anesthesia Evaluation    Airway        Dental   Pulmonary           Cardiovascular hypertension,   Echo 08/28/20 [facility]): SUMMARY  Left ventricular systolic functionis normal.  LV ejection fraction = 65-70%.  There is moderate concentric left ventricular hypertrophy.  No segmental wall motion abnormalities seen in the left ventricle  Left ventricular filling pattern is prolonged relaxation.  The right ventricle is moderately dilated.  The right ventricular systolic function is normal.  The left atrium is mildly dilated.  The right atrium is mildly dilated.  There is mild aortic stenosis.  There is mild aortic regurgitation.  There is mild tricuspid regurgitation.  Severe pulmonary hypertension.  Estimated right ventricular systolic pressure is 67-59 mmHg.  Mildly dilated ascending aorta.  Ascending aorta diameter 4.1 cm  IVC size was severely dilated.  There is a trivial pericardial effusion.  There is no comparison study available.  (Comparison echo 05/17/19: LVEF 65-70%, severe LVH (consider work-up for infiltrative cardiomyopathy such as amyloidosis), normal wall motion, grade 1 DD, elevated LV filling pressure, normal RV function, severe LAE, aortic valve sclerosis with moderate AI, trivial MR, mild TR, RVSP 54 mmHg, dilated IVC that collapses, trivial pericardial effusion)  Cardiac cath 08/27/20 [facility]):  Impression:  Normal coronary arteries  Normal left ventricular end-diastolic pressure      Neuro/Psych    GI/Hepatic   Endo/Other    Renal/GU      Musculoskeletal   Abdominal   Peds  Hematology   Anesthesia Other Findings   Reproductive/Obstetrics                             Anesthesia Physical Anesthesia Plan  ASA:   Anesthesia Plan:    Post-op Pain Management:    Induction:   PONV Risk Score and Plan:   Airway Management Planned:   Additional  Equipment:   Intra-op Plan:   Post-operative Plan:   Informed Consent:   Plan Discussed with:   Anesthesia Plan Comments: (PAT note written 01/09/2021 by Myra Gianotti, PA-C. )        Anesthesia Quick Evaluation

## 2021-01-09 NOTE — Progress Notes (Signed)
Anesthesia Chart Review:  Case: [id number] Date/Time: 01/10/21 0730   Procedures:      LIGATION OF LEFT ARM RADIOCEPHALIC FISTULA (Left )     CONVERSION TO BRACHIOCEPHALIC ARTERIOVENOUS FISTULA (Left )   Anesthesia type: Choice   Pre-op diagnosis: COMPLICATION OF ARTERIOVENOUS FISTULA   Location: [name] / [place] OR   Surgeons: Serafina Mitchell, MD      DISCUSSION: Patient is a 60 year old male scheduled for the above procedure. He is s/p left radiocephalic AVF in 0263 with plication of AVF in 7858. He has had persistent edema to the left forearm.  He has undergone cephalic vein angioplasty x2 by Dr. Maxie Better in 2022. Patient says pain and swelling in no longer tolerable, so the above procedure planned.   History includes never smoker, ESRD, HFpEF, CAD (mild, nonobstructive by coronary CT 2019 with normal coronaries by 08/27/20 [facility]), pulmonary hypertension (severe by 08/2020 echo), thoracic aortic aneurysm (4.5 cm by CT 05/23/20), HTN, HLD, home oxygen, GERD, anemia, GI bleed (2018), CVA, asthma, neck surgery (C5-7 ACDF 01/04/20).  - Admission to [name] 09/09/20-09/12/20 for acute pulmonary edema secondary to missed hemodialysis. Initially required BiPAPA for O2 sats in the 70's, but weaned to 2L/[place]. K 6.3. Serum bicarb of 17 with anion gap of 24. He underwent urgent HD. He was being considered for SNF placement but he left AMA. - Admission [facility] 09/04/20 for SOB with volume overload in setting of no HD in six days. Mild hypoxia requiring 3L/[place]. He underwent hemodialysis, and patient left on his own accord afterwards without waiting for re-assessment. - Hospitalized at [name] 08/25/20-08/29/20 for acute respiratory failure with hypoxia secondary to volume overload in the setting of missed HD x 2 weeks and underlying HFpEF. He became unresponsive towards the end of hemodialysis but head CT, ammonia and electrolyte results unremarkable. Patient drowsy, but no focal neurological defects. Known right hand  tremors. Neurology thought unresponsive epidose was possibly related to fluid and electrolyte shifts during HD. If MRI negative for acute findings then not other recommendations. Cardiology consulted due to elevated troponin, thought to be type II NSTEMI (demand ischemia). [facility] showed normal coronaries. Echo showed normal LVEF with severe pulmonary hypertension (RVSP 70-80). While hospitalized he was transfused with 1 unit PRBC for HGB 6.8. He signed on AMA.  Last PCP visit with [name], [name] on 10/04/20. Home oxygen renewed (he had been out for one month with RA O2 sat 83%, improved to 95% on 2L/[name]).   He was being considered for surgery with Dr. Tonita Cong earlier this year. Cardiologist Dr. Harriet Masson was contacted for clearance. She wrote, "No additional work up required - recent [place]. Please note to physician that patient is noncompliant with most medical therapy." Per [name], NP-C on 09/21/20, "Given past medical history and time since last visit, based on ACC/AHA guidelines, Thomas Robinson would be at acceptable risk for the planned procedure without further cardiovascular testing.  Per Dr. Harriet Masson: Patient is noncompliant with most medical therapy."    VS:  BP Readings from Last 3 Encounters:  01/07/21 (!) 174/89  01/01/21 (!) 145/78  12/27/20 (!) 158/74   Pulse Readings from Last 3 Encounters:  01/07/21 84  01/01/21 76  12/27/20 99    Spirometry 06/26/20 ([name]): Impression: There is a severe obstructive ventilatory defect. The total lung capacity is mildly reduced. Air trapping is not seen. The diffusing capacity is mildly reduced. There is a significant response to bronchodilators.  PROVIDERS: [name], PA is PCP ([name]) [name], DO is cardiologist. Last evaluation 09/10/20 by [name], MD during admission for volume overload.   LABS: For ISTAT on arrival. Last lab results include:   IMAGES: 1V CXR 09/19/20: IMPRESSION: Bilateral  patchy interstitial and alveolar airspace opacities concerning for multilobar pneumonia including atypical viral pneumonia.   EKG: 09/18/20: Sinus rhythm LAE, consider biatrial enlargement Left bundle branch block Artifact in lead(s) I II aVR aVL aVF V1 V2 Confirmed by Gerlene Fee [phone]) on 09/18/2020 7:01:23 AM   CV: Echo 08/28/20 [name]): SUMMARY  Left ventricular systolic functionis normal.  LV ejection fraction = 65-70%.  There is moderate concentric left ventricular hypertrophy.  No segmental wall motion abnormalities seen in the left ventricle  Left ventricular filling pattern is prolonged relaxation.  The right ventricle is moderately dilated.  The right ventricular systolic function is normal.  The left atrium is mildly dilated.  The right atrium is mildly dilated.  There is mild aortic stenosis.  There is mild aortic regurgitation.  There is mild tricuspid regurgitation.  Severe pulmonary hypertension.  Estimated right ventricular systolic pressure is 81-85 mmHg.  Mildly dilated ascending aorta.  Ascending aorta diameter 4.1 cm  IVC size was severely dilated.  There is a trivial pericardial effusion.  There is no comparison study available.  (Comparison echo 05/17/19: LVEF 65-70%, severe LVH (consider work-up for infiltrative cardiomyopathy such as amyloidosis), normal wall motion, grade 1 DD, elevated LV filling pressure, normal RV function, severe LAE, aortic valve sclerosis with moderate AI, trivial MR, mild TR, RVSP 54 mmHg, dilated IVC that collapses, trivial pericardial effusion)  Cardiac cath 08/27/20 [name]): This demonstrated the following findings:  LM: Normal  LAD: Normal  Lcx: Normal  RCA: Normal  Impression:  Normal coronary arteries  Normal left ventricular end-diastolic pressure  Plan:  1. Secondary prevention of coronary artery disease  2. Medical management    Coronary CT 06/24/18  IMPRESSION: 1. Coronary calcium score of 39.  This was 56 percentile for age and sex matched control. 2. Normal coronary origin with right dominance. 3. Mild non-obstructive CAD. 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 4. Aneurysmal dilatation of the ascending aorta with maximum diameter 43 mm, no calcifications and no dissection. 5. Dilated pulmonary artery measuring 35 mm suggestive of pulmonary Hypertension.   Carotid US 04/10/15: Summary:  Mild intimal thickening with no significant extracranial carotid  artery stenosis demonstrated. Vertebrals are patent with antegrade  flow.    Past Medical History:  Diagnosis Date  . Anaphylactic shock, unspecified, sequela 06/10/2019  . Arthritis   . Asthma, chronic, unspecified asthma severity, with acute exacerbation 10/23/2017  . CAP (community acquired pneumonia) 10/23/2017  . Carpal tunnel syndrome of right wrist 10/05/2018  . Cataract    right - removed by surgery  . Cerebral infarction due to thrombosis of cerebral artery ([place])   . Cervical disc herniation 01/04/2020  . Chronic diastolic heart failure ([place]) 04/13/2018  . Chronic low back pain 11/24/2019  . Constipation   . Cough    chronic cough  . Encounter for immunization 07/08/2017  . ESRD on hemodialysis ([place]) 02/16/2018   Dialysis  T Thus Sat  - [facility]   . Fall 06/04/2019  . GERD (gastroesophageal reflux disease) 06/04/2019  . GI bleeding 05/24/2017  . Gram-negative sepsis, unspecified ([place]) 09/05/2017  . History of fusion of cervical spine 03/26/2020  . Hyperlipidemia   . Hypertension   .  Hypokalemia 06/04/2017  . Iron deficiency anemia, unspecified 09/09/2017  . Left shoulder pain 11/11/2019  . Lumbar radiculopathy 11/24/2019  . Lung contusion 06/04/2019  . LVH (left ventricular hypertrophy) due to hypertensive disease, with heart failure ([place]) 06/18/2020  . Macrocytic anemia 05/24/2017  . Moderate protein-calorie malnutrition ([place]) 06/06/2017  . Myofascial pain syndrome 06/12/2020  .  Neuritis of right ulnar nerve 09/13/2018  . Non-compliance with renal dialysis ([place]) 02/08/2020  . Oxygen deficiency 12/30/2019   O2 sats on RA 87% at PAT appt   . Pain in joint of right elbow 09/13/2018  . Renovascular hypertension 06/22/2020  . Restless leg syndrome   . Rib fractures 06/2019   Right  . Secondary hyperparathyroidism of renal origin ([place]) 06/04/2017  . Thoracic ascending aortic aneurysm (H[facility])    4.5 cm 06/04/19 CT  . Ulnar neuropathy 10/05/2018  . Volume overload 10/23/2017    Past Surgical History:  Procedure Laterality Date  . A/V FISTULAGRAM N/A 01/01/2021   Procedure: A/V FISTULAGRAM;  Surgeon: Serafina Mitchell, MD;  Location: [facility];  Service: Cardiovascular;  Laterality: N/A;  . ANTERIOR CERVICAL DECOMP/DISCECTOMY FUSION N/A 01/04/2020   Procedure: ANTERIOR CERVICAL DECOMPRESSION/DISCECTOMY FUSION CERVICAL FIVE THROUGH SEVEN;  Surgeon: Melina Schools, MD;  Location: [place];  Service: Orthopedics;  Laterality: N/A;  3 hrs  . AV FISTULA PLACEMENT Left 05/28/2017   Procedure: LEFT ARM ARTERIOVENOUS (AV) FISTULA CREATION;  Surgeon: Conrad Bolton, MD;  Location: [place];  Service: Vascular;  Laterality: Left;  . EYE SURGERY Right 06/02/2019   Cataract removed  . FRACTURE SURGERY    . IR DIALY SHUNT INTRO NEEDLE/INTRACATH INITIAL W/IMG LEFT Left 09/12/2020  . IR FLUORO GUIDE CV LINE RIGHT  05/25/2017  . IR US GUIDE VASC ACCESS LEFT  09/12/2020  . IR US GUIDE VASC ACCESS RIGHT  05/25/2017  . REVISON OF ARTERIOVENOUS FISTULA Left 07/18/2019   Procedure: REVISION PLICATION OF RADIOCEPHALIC ARTERIOVENOUS FISTULA LEFT ARM;  Surgeon: Angelia Mould, MD;  Location: [place];  Service: Vascular;  Laterality: Left;  . RINOPLASTY      MEDICATIONS: No current facility-administered medications for this encounter.   [name] albuterol (VENTOLIN HFA) 108 (90 Base) MCG/ACT inhaler  . amLODipine (NORVASC) 10 MG tablet  . atorvastatin (LIPITOR) 20 MG tablet  . AURYXIA 1 GM 210 MG(Fe)  tablet  . BREO ELLIPTA 100-25 MCG/INH AEPB  . cholecalciferol (VITAMIN D3) 25 MCG (1000 UNIT) tablet  . cinacalcet (SENSIPAR) 60 MG tablet  . DIPHENHYDRAMINE HCL PO  . doxercalciferol (HECTOROL) 0.5 MCG capsule  . furosemide (LASIX) 40 MG tablet  . gabapentin (NEURONTIN) 300 MG capsule  . heparin 1000 unit/mL SOLN injection  . hydrALAZINE (APRESOLINE) 25 MG tablet  . Methoxy PEG-Epoetin Beta (MIRCERA IJ)  . metoprolol tartrate (LOPRESSOR) 100 MG tablet  . multivitamin (RENA-VIT) TABS tablet  . pregabalin (LYRICA) 25 MG capsule  . sevelamer carbonate (RENVELA) 800 MG tablet  . Specialty Vitamins Products (HEALTHY HEART COMPLEX PO)  . traMADol (ULTRAM) 50 MG tablet    Myra Gianotti, PA-C Surgical Short Stay/Anesthesiology [facility] Phone [phone] [facility] Phone [phone] 01/09/2021 1:27 PM

## 2021-01-09 NOTE — Anesthesia Preprocedure Evaluation (Addendum)
Anesthesia Evaluation  Patient identified by MRN, date of birth, ID band Patient awake    Reviewed: Allergy & Precautions, NPO status , Patient's Chart, lab work & pertinent test results, reviewed documented beta blocker date and time   History of Anesthesia Complications Negative for: history of anesthetic complications  Airway Mallampati: II  TM Distance: >3 FB Neck ROM: Full    Dental no notable dental hx. (+) Dental Advisory Given   Pulmonary shortness of breath, asthma , pneumonia,    Pulmonary exam normal        Cardiovascular hypertension, Pt. on medications and Pt. on home beta blockers +CHF  Normal cardiovascular exam  Pulmonary htn Echo 08/28/20 [facility]): SUMMARY  Left ventricular systolic functionis normal.  LV ejection fraction = 65-70%.  There is moderate concentric left ventricular hypertrophy.  No segmental wall motion abnormalities seen in the left ventricle  Left ventricular filling pattern is prolonged relaxation.  The right ventricle is moderately dilated.  The right ventricular systolic function is normal.  The left atrium is mildly dilated.  The right atrium is mildly dilated.  There is mild aortic stenosis.  There is mild aortic regurgitation.  There is mild tricuspid regurgitation.  Severe pulmonary hypertension.  Estimated right ventricular systolic pressure is 27-78 mmHg.  Mildly dilated ascending aorta.  Ascending aorta diameter 4.1 cm  IVC size was severely dilated.  There is a trivial pericardial effusion.  There is no comparison study available.  (Comparison echo 05/17/19: LVEF 65-70%, severe LVH (consider work-up for infiltrative cardiomyopathy such as amyloidosis), normal wall motion, grade 1 DD, elevated LV filling pressure, normal RV function, severe LAE, aortic valve sclerosis with moderate AI, trivial MR, mild TR, RVSP 54 mmHg, dilated IVC that collapses, trivial pericardial  effusion)  Cardiac cath 08/27/20 [facility]):  Impression:  Normal coronary arteries  Normal left ventricular end-diastolic pressure      Neuro/Psych CVA negative psych ROS   GI/Hepatic Neg liver ROS, GERD  ,  Endo/Other  negative endocrine ROS  Renal/GU DialysisRenal disease     Musculoskeletal  (+) Arthritis ,   Abdominal   Peds  Hematology negative hematology ROS (+) anemia ,   Anesthesia Other Findings   Reproductive/Obstetrics                            Anesthesia Physical  Anesthesia Plan  ASA: IV  Anesthesia Plan: Regional and MAC   Post-op Pain Management:    Induction: Intravenous  PONV Risk Score and Plan: 1 and Ondansetron  Airway Management Planned: Natural Airway  Additional Equipment: None  Intra-op Plan:   Post-operative Plan:   Informed Consent: I have reviewed the patients History and Physical, chart, labs and discussed the procedure including the risks, benefits and alternatives for the proposed anesthesia with the patient or authorized representative who has indicated his/her understanding and acceptance.     Dental advisory given  Plan Discussed with: Anesthesiologist and CRNA  Anesthesia Plan Comments: ( )      Anesthesia Quick Evaluation                                  Anesthesia Evaluation  Patient identified by MRN, date of birth, ID band Patient awake    Reviewed: Allergy & Precautions, NPO status , Patient's Chart, lab work & pertinent test results  Airway Mallampati: II  TM Distance: >3  FB     Dental  (+) Dental Advisory Given, Caps,    Pulmonary pneumonia,    breath sounds clear to auscultation       Cardiovascular hypertension, Pt. on medications and Pt. on home beta blockers +CHF   Rhythm:Regular Rate:Normal     Neuro/Psych    GI/Hepatic Neg liver ROS, GERD  ,  Endo/Other  negative endocrine ROS  Renal/GU DialysisRenal diseaseT, TH, SAT Last HD Sat  11/14     Musculoskeletal   Abdominal   Peds  Hematology  (+) anemia ,   Anesthesia Other Findings   Reproductive/Obstetrics                           Anesthesia Physical Anesthesia Plan  ASA: III  Anesthesia Plan: General   Post-op Pain Management:    Induction: Intravenous  PONV Risk Score and Plan: 2 and Ondansetron and Midazolam  Airway Management Planned: LMA  Additional Equipment:   Intra-op Plan:   Post-operative Plan:   Informed Consent: I have reviewed the patients History and Physical, chart, labs and discussed the procedure including the risks, benefits and alternatives for the proposed anesthesia with the patient or authorized representative who has indicated his/her understanding and acceptance.     Dental advisory given  Plan Discussed with: CRNA and Anesthesiologist  Anesthesia Plan Comments:                                   Anesthesia Evaluation  Patient identified by MRN, date of birth, ID band Patient awake    Reviewed: Allergy & Precautions, NPO status , Patient's Chart, lab work & pertinent test results  Airway Mallampati: II  TM Distance: >3 FB     Dental  (+) Dental Advisory Given, Caps,    Pulmonary pneumonia,    breath sounds clear to auscultation       Cardiovascular hypertension, Pt. on medications and Pt. on home beta blockers +CHF   Rhythm:Regular Rate:Normal     Neuro/Psych    GI/Hepatic Neg liver ROS, GERD  ,  Endo/Other  negative endocrine ROS  Renal/GU DialysisRenal diseaseT, TH, SAT Last HD Sat 11/14     Musculoskeletal   Abdominal   Peds  Hematology  (+) anemia ,   Anesthesia Other Findings   Reproductive/Obstetrics                           Anesthesia Physical Anesthesia Plan  ASA: III  Anesthesia Plan: General   Post-op Pain Management:    Induction: Intravenous  PONV Risk Score and Plan: 2 and Ondansetron and  Midazolam  Airway Management Planned: LMA  Additional Equipment:   Intra-op Plan:   Post-operative Plan:   Informed Consent: I have reviewed the patients History and Physical, chart, labs and discussed the procedure including the risks, benefits and alternatives for the proposed anesthesia with the patient or authorized representative who has indicated his/her understanding and acceptance.     Dental advisory given  Plan Discussed with: CRNA and Anesthesiologist  Anesthesia Plan Comments: (Follows with cardiology for HFpEF, CAD (mild, nonobstructive by coronary CT 2019) , monitoring of thoracic aortic aneurysm (4.5 cm by CT 2020). Last seen by Dr. Harriet Masson 07/15/19. Per note he was started on Imdur for treatment of stable angina. It was discussed that he would be having fistula revision surgery  on 06/17/19.  Will need DOS labs and eval.    EKG 06/10/19: NSR. Rate 73. Possible LAE. LVH.  TTE 05/17/2019  SUMMARY LVEF 65-70%, severe LVH (consider work-up for infiltrative cardiomyopathy such as amyloidosis), normal wall motion, grade 1 DD, elevated LV filling pressure, normal RV function, severe LAE, aortic valve sclerosis with moderate AI, trivial MR, mild TR, RVSP 54 mmHg, dilated IVC that collapses, trivial pericardial effusion  Coronary CT 06/24/18  IMPRESSION: 1. Coronary calcium score of 39. This was 35 percentile for age and sex matched control. 2. Normal coronary origin with right dominance. 3. Mild non-obstructive CAD. 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 4. Aneurysmal dilatation of the ascending aorta with maximum diameter 43 mm, no calcifications and no dissection. 5. Dilated pulmonary artery measuring 35 mm suggestive of pulmonary hypertension. )       Anesthesia Quick Evaluation (Follows with cardiology for HFpEF, CAD (mild, nonobstructive by coronary CT 2019) , monitoring of thoracic aortic aneurysm (4.5 cm by CT 2020). Last seen by  Dr. Harriet Masson 07/15/19. Per note he was started on Imdur for treatment of stable angina. It was discussed that he would be having fistula revision surgery on 06/17/19.  Will need DOS labs and eval.    EKG 06/10/19: NSR. Rate 73. Possible LAE. LVH.  TTE 05/17/2019  SUMMARY LVEF 65-70%, severe LVH (consider work-up for infiltrative cardiomyopathy such as amyloidosis), normal wall motion, grade 1 DD, elevated LV filling pressure, normal RV function, severe LAE, aortic valve sclerosis with moderate AI, trivial MR, mild TR, RVSP 54 mmHg, dilated IVC that collapses, trivial pericardial effusion  Coronary CT 06/24/18  IMPRESSION: 1. Coronary calcium score of 39. This was 85 percentile for age and sex matched control. 2. Normal coronary origin with right dominance. 3. Mild non-obstructive CAD. 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 4. Aneurysmal dilatation of the ascending aorta with maximum diameter 43 mm, no calcifications and no dissection. 5. Dilated pulmonary artery measuring 35 mm suggestive of pulmonary hypertension. )       Anesthesia Quick Evaluation

## 2021-01-09 NOTE — Progress Notes (Signed)
Patient denies shortness of breath, fever, cough or chest pain.  PCP - [name], PA-C Cardiologist - [name], DO  Chest x-ray - 09/19/20 (1V) EKG - 09/18/20 Stress Test - n/a ECHO - 05/17/19 Cardiac Cath - 08/27/20  Anesthesia review: Yes  STOP now taking any Aspirin (unless otherwise instructed by your surgeon), Aleve, Naproxen, Ibuprofen, Motrin, Advil, Goody's, BC's, all herbal medications, fish oil, and all vitamins.   Coronavirus Screening Covid test on 01/08/21 was negative.  Patient verbalized understanding of instructions that were given via phone.

## 2021-01-10 ENCOUNTER — Ambulatory Visit (HOSPITAL_COMMUNITY)
Admission: RE | Admit: 2021-01-10 | Discharge: 2021-01-10 | Disposition: A | Discharge status: Home / Self Care | Payer: Medicare Other | Source: Home / Self Care | Attending: Surgery | Admitting: Surgery

## 2021-01-10 ENCOUNTER — Ambulatory Visit (HOSPITAL_COMMUNITY): Payer: Medicare Other | Admitting: Vascular Surgery

## 2021-01-10 ENCOUNTER — Encounter (HOSPITAL_COMMUNITY): Payer: Self-pay | Admitting: Surgery

## 2021-01-10 ENCOUNTER — Encounter (HOSPITAL_COMMUNITY)
Admission: RE | Disposition: A | Discharge status: Home / Self Care | Payer: Self-pay | Source: Home / Self Care | Attending: Surgery

## 2021-01-10 DIAGNOSIS — T82510A Breakdown (mechanical) of surgically created arteriovenous fistula, initial encounter: Secondary | ICD-10-CM | POA: Insufficient documentation

## 2021-01-10 DIAGNOSIS — M7989 Other specified soft tissue disorders: Secondary | ICD-10-CM | POA: Insufficient documentation

## 2021-01-10 DIAGNOSIS — N186 End stage renal disease: Secondary | ICD-10-CM | POA: Insufficient documentation

## 2021-01-10 DIAGNOSIS — I120 Hypertensive chronic kidney disease with stage 5 chronic kidney disease or end stage renal disease: Secondary | ICD-10-CM | POA: Insufficient documentation

## 2021-01-10 DIAGNOSIS — N185 Chronic kidney disease, stage 5: Secondary | ICD-10-CM

## 2021-01-10 DIAGNOSIS — Y841 Kidney dialysis as the cause of abnormal reaction of the patient, or of later complication, without mention of misadventure at the time of the procedure: Secondary | ICD-10-CM | POA: Insufficient documentation

## 2021-01-10 DIAGNOSIS — Z992 Dependence on renal dialysis: Secondary | ICD-10-CM

## 2021-01-10 DIAGNOSIS — Z79899 Other long term (current) drug therapy: Secondary | ICD-10-CM | POA: Insufficient documentation

## 2021-01-10 DIAGNOSIS — T82898A Other specified complication of vascular prosthetic devices, implants and grafts, initial encounter: Secondary | ICD-10-CM

## 2021-01-10 HISTORY — PX: REVISON OF ARTERIOVENOUS FISTULA: SHX6074

## 2021-01-10 HISTORY — PX: LIGATION OF ARTERIOVENOUS  FISTULA: SHX5948

## 2021-01-10 HISTORY — DX: Pulmonary hypertension, unspecified: I27.20

## 2021-01-10 LAB — POCT I-STAT, CHEM 8
BUN: 61 mg/dL — ABNORMAL HIGH (ref 8–23)
Calcium, Ion: 1.08 mmol/L — ABNORMAL LOW (ref 1.15–1.40)
Chloride: 105 mmol/L (ref 98–111)
Creatinine, Ser: 9.3 mg/dL — ABNORMAL HIGH (ref 0.61–1.24)
Glucose, Bld: 84 mg/dL (ref 70–99)
HCT: 25 % — ABNORMAL LOW (ref 39.0–52.0)
Hemoglobin: 8.5 g/dL — ABNORMAL LOW (ref 13.0–17.0)
Potassium: 4.7 mmol/L (ref 3.5–5.1)
Sodium: 138 mmol/L (ref 135–145)
TCO2: 23 mmol/L (ref 22–32)

## 2021-01-10 LAB — GLUCOSE, CAPILLARY: Glucose-Capillary: 211 mg/dL — ABNORMAL HIGH (ref 70–99)

## 2021-01-10 SURGERY — LIGATION OF ARTERIOVENOUS  FISTULA
Anesthesia: Regional | Site: Arm Upper | Laterality: Left

## 2021-01-10 MED ORDER — ACETAMINOPHEN 500 MG PO TABS
1000.0000 mg | ORAL_TABLET | Freq: Once | ORAL | Status: AC
  Administered 2021-01-10: 1000 mg via ORAL
  Filled 2021-01-10: qty 2

## 2021-01-10 MED ORDER — ONDANSETRON HCL 4 MG/2ML IJ SOLN
INTRAMUSCULAR | Status: AC
  Filled 2021-01-10: qty 2

## 2021-01-10 MED ORDER — SODIUM CHLORIDE 0.9 % IV SOLN
INTRAVENOUS | Status: DC | PRN
  Administered 2021-01-10: 500 mL

## 2021-01-10 MED ORDER — CEFAZOLIN SODIUM-DEXTROSE 2-4 GM/100ML-% IV SOLN
2.0000 g | INTRAVENOUS | Status: AC
  Administered 2021-01-10: 2 g via INTRAVENOUS
  Filled 2021-01-10: qty 100

## 2021-01-10 MED ORDER — SODIUM CHLORIDE 0.9 % IV SOLN: INTRAVENOUS | Status: DC

## 2021-01-10 MED ORDER — ORAL CARE MOUTH RINSE: 15.0000 mL | Freq: Once | OROMUCOSAL | Status: AC

## 2021-01-10 MED ORDER — CHLORHEXIDINE GLUCONATE 4 % EX LIQD: 60.0000 mL | Freq: Once | CUTANEOUS | Status: DC

## 2021-01-10 MED ORDER — LACTATED RINGERS IV SOLN: INTRAVENOUS | Status: DC

## 2021-01-10 MED ORDER — PROPOFOL 500 MG/50ML IV EMUL
INTRAVENOUS | Status: DC | PRN
  Administered 2021-01-10: 50 ug/kg/min via INTRAVENOUS

## 2021-01-10 MED ORDER — CHLORHEXIDINE GLUCONATE 0.12 % MT SOLN
15.0000 mL | Freq: Once | OROMUCOSAL | Status: AC
  Administered 2021-01-10: 15 mL via OROMUCOSAL
  Filled 2021-01-10: qty 15

## 2021-01-10 MED ORDER — DEXAMETHASONE SODIUM PHOSPHATE 10 MG/ML IJ SOLN
INTRAMUSCULAR | Status: DC | PRN
  Administered 2021-01-10: 5 mg

## 2021-01-10 MED ORDER — FENTANYL CITRATE (PF) 100 MCG/2ML IJ SOLN
25.0000 ug | INTRAMUSCULAR | Status: DC | PRN
  Administered 2021-01-10: 25 ug via INTRAVENOUS
  Administered 2021-01-10: 50 ug via INTRAVENOUS
  Administered 2021-01-10: 25 ug via INTRAVENOUS

## 2021-01-10 MED ORDER — FENTANYL CITRATE (PF) 250 MCG/5ML IJ SOLN
INTRAMUSCULAR | Status: DC | PRN
  Administered 2021-01-10 (×2): 50 ug via INTRAVENOUS

## 2021-01-10 MED ORDER — HEMOSTATIC AGENTS (NO CHARGE) OPTIME
TOPICAL | Status: DC | PRN
  Administered 2021-01-10: 1 via TOPICAL

## 2021-01-10 MED ORDER — 0.9 % SODIUM CHLORIDE (POUR BTL) OPTIME
TOPICAL | Status: DC | PRN
  Administered 2021-01-10: 1000 mL

## 2021-01-10 MED ORDER — SODIUM CHLORIDE 0.9 % IV SOLN
INTRAVENOUS | Status: AC
  Filled 2021-01-10: qty 1.2

## 2021-01-10 MED ORDER — MIDAZOLAM HCL 2 MG/2ML IJ SOLN
INTRAMUSCULAR | Status: DC | PRN
  Administered 2021-01-10: 2 mg via INTRAVENOUS

## 2021-01-10 MED ORDER — ONDANSETRON HCL 4 MG/2ML IJ SOLN
INTRAMUSCULAR | Status: DC | PRN
  Administered 2021-01-10: 4 mg via INTRAVENOUS

## 2021-01-10 MED ORDER — LIDOCAINE-EPINEPHRINE 1 %-1:100000 IJ SOLN
INTRAMUSCULAR | Status: AC
  Filled 2021-01-10: qty 2

## 2021-01-10 MED ORDER — MEPIVACAINE HCL (PF) 2 % IJ SOLN
INTRAMUSCULAR | Status: DC | PRN
  Administered 2021-01-10: 30 mL

## 2021-01-10 MED ORDER — LIDOCAINE-EPINEPHRINE 1 %-1:100000 IJ SOLN
INTRAMUSCULAR | Status: DC | PRN
  Administered 2021-01-10: 4 mL

## 2021-01-10 MED ORDER — FENTANYL CITRATE (PF) 250 MCG/5ML IJ SOLN
INTRAMUSCULAR | Status: AC
  Filled 2021-01-10: qty 5

## 2021-01-10 MED ORDER — TRAMADOL HCL 50 MG PO TABS: 50.0000 mg | ORAL_TABLET | Freq: Three times a day (TID) | ORAL | 0 refills | Status: DC | PRN

## 2021-01-10 MED ORDER — MIDAZOLAM HCL 2 MG/2ML IJ SOLN
INTRAMUSCULAR | Status: AC
  Filled 2021-01-10: qty 2

## 2021-01-10 MED ORDER — FENTANYL CITRATE (PF) 100 MCG/2ML IJ SOLN
INTRAMUSCULAR | Status: AC
  Filled 2021-01-10: qty 2

## 2021-01-10 SURGICAL SUPPLY — 39 items
ADH SKN CLS APL DERMABOND .7 (GAUZE/BANDAGES/DRESSINGS) ×1
ADH SKN CLS LQ APL DERMABOND (GAUZE/BANDAGES/DRESSINGS) ×1
ARMBAND PINK RESTRICT EXTREMIT (MISCELLANEOUS) ×2 IMPLANT
CANISTER SUCT 3000ML PPV (MISCELLANEOUS) ×2 IMPLANT
CLIP VESOCCLUDE MED 6/CT (CLIP) ×2 IMPLANT
CLIP VESOCCLUDE SM WIDE 6/CT (CLIP) ×2 IMPLANT
COVER PROBE W GEL 5X96 (DRAPES) ×2 IMPLANT
COVER WAND RF STERILE (DRAPES) ×2 IMPLANT
DERMABOND ADHESIVE PROPEN (GAUZE/BANDAGES/DRESSINGS) ×1
DERMABOND ADVANCED (GAUZE/BANDAGES/DRESSINGS) ×1
DERMABOND ADVANCED .7 DNX12 (GAUZE/BANDAGES/DRESSINGS) ×1 IMPLANT
DERMABOND ADVANCED .7 DNX6 (GAUZE/BANDAGES/DRESSINGS) ×1 IMPLANT
ELECT REM PT RETURN 9FT ADLT (ELECTROSURGICAL) ×2
ELECTRODE REM PT RTRN 9FT ADLT (ELECTROSURGICAL) ×1 IMPLANT
GLOVE BIOGEL PI IND STRL 7.5 (GLOVE) ×1 IMPLANT
GLOVE BIOGEL PI INDICATOR 7.5 (GLOVE) ×1
GLOVE SURG SS PI 7.5 STRL IVOR (GLOVE) ×2 IMPLANT
GOWN STRL REUS W/ TWL LRG LVL3 (GOWN DISPOSABLE) ×2 IMPLANT
GOWN STRL REUS W/ TWL XL LVL3 (GOWN DISPOSABLE) ×1 IMPLANT
GOWN STRL REUS W/TWL LRG LVL3 (GOWN DISPOSABLE) ×4
GOWN STRL REUS W/TWL XL LVL3 (GOWN DISPOSABLE) ×2
HEMOSTAT SNOW SURGICEL 2X4 (HEMOSTASIS) IMPLANT
KIT BASIN OR (CUSTOM PROCEDURE TRAY) ×2 IMPLANT
KIT TURNOVER KIT B (KITS) ×2 IMPLANT
NS IRRIG 1000ML POUR BTL (IV SOLUTION) ×2 IMPLANT
PACK CV ACCESS (CUSTOM PROCEDURE TRAY) ×2 IMPLANT
PAD ARMBOARD 7.5X6 YLW CONV (MISCELLANEOUS) ×4 IMPLANT
SUT ETHILON 3 0 PS 1 (SUTURE) IMPLANT
SUT PROLENE 5 0 C 1 24 (SUTURE) ×2 IMPLANT
SUT PROLENE 6 0 BV (SUTURE) IMPLANT
SUT PROLENE 6 0 CC (SUTURE) ×4 IMPLANT
SUT SILK 0 TIES 10X30 (SUTURE) ×2 IMPLANT
SUT VIC AB 3-0 SH 27 (SUTURE) ×4
SUT VIC AB 3-0 SH 27X BRD (SUTURE) ×2 IMPLANT
SUT VIC AB 4-0 PS2 18 (SUTURE) ×2 IMPLANT
SUT VICRYL 4-0 PS2 18IN ABS (SUTURE) IMPLANT
TOWEL GREEN STERILE (TOWEL DISPOSABLE) ×2 IMPLANT
UNDERPAD 30X36 HEAVY ABSORB (UNDERPADS AND DIAPERS) ×2 IMPLANT
WATER STERILE IRR 1000ML POUR (IV SOLUTION) ×2 IMPLANT

## 2021-01-10 NOTE — Anesthesia Procedure Notes (Signed)
Anesthesia Regional Block: Supraclavicular block   Pre-Anesthetic Checklist: ,, timeout performed, Correct Patient, Correct Site, Correct Laterality, Correct Procedure, Correct Position, site marked, Risks and benefits discussed,  Surgical consent,  Pre-op evaluation,  At surgeon's request and post-op pain management  Laterality: Left  Prep: chloraprep       Needles:  Injection technique: Single-shot  Needle Type: Echogenic Stimulator Needle     Needle Length: 5cm  Needle Gauge: 22     Additional Needles:   Narrative:  Start time: 01/10/2021 7:11 AM End time: 01/10/2021 7:21 AM Injection made incrementally with aspirations every 5 mL.  Performed by: Personally  Anesthesiologist: Duane Boston, MD  Additional Notes: Functioning IV was confirmed and monitors applied.  A 53mm 22ga echogenic arrow stimulator was used. Sterile prep and drape,hand hygiene and sterile gloves were used.Ultrasound guidance: relevant anatomy identified, needle position confirmed, local anesthetic spread visualized around nerve(s)., vascular puncture avoided.  Image printed for medical record.  Negative aspiration and negative test dose prior to incremental administration of local anesthetic. The patient tolerated the procedure well.

## 2021-01-10 NOTE — Interval H&P Note (Signed)
History and Physical Interval Note:  01/10/2021 7:31 AM  Thomas Robinson  has presented today for surgery, with the diagnosis of COMPLICATION OF ARTERIOVENOUS FISTULA.  The various methods of treatment have been discussed with the patient and family. After consideration of risks, benefits and other options for treatment, the patient has consented to  Procedure(s): LIGATION OF LEFT ARM RADIOCEPHALIC FISTULA (Left) CONVERSION TO BRACHIOCEPHALIC ARTERIOVENOUS FISTULA (Left) as a surgical intervention.  The patient's history has been reviewed, patient examined, no change in status, stable for surgery.  I have reviewed the patient's chart and labs.  Questions were answered to the patient's satisfaction.     [name]

## 2021-01-10 NOTE — Anesthesia Procedure Notes (Signed)
Procedure Name: MAC Date/Time: 01/10/2021 9:16 AM Performed by: Kathryne Hitch, CRNA Pre-anesthesia Checklist: Patient identified, Emergency Drugs available, Patient being monitored and Suction available Oxygen Delivery Method: Simple face mask Preoxygenation: Pre-oxygenation with 100% oxygen Induction Type: IV induction Placement Confirmation: positive ETCO2 Dental Injury: Teeth and Oropharynx as per pre-operative assessment

## 2021-01-10 NOTE — Progress Notes (Signed)
Orthopedic Tech Progress Note Patient Details:  Thomas Robinson May 23, 1961 [id number]  Ortho Devices Type of Ortho Device: Arm sling Ortho Device/Splint Location: LLE Ortho Device/Splint Interventions: Ordered       Rohaan Durnil A Tifany Hirsch 01/10/2021, 11:25 AM

## 2021-01-10 NOTE — Anesthesia Postprocedure Evaluation (Signed)
Anesthesia Post Note  Patient: Thomas Robinson  Procedure(s) Performed: LIGATION OF LEFT ARM RADIOCEPHALIC FISTULA (Left Arm Upper) CONVERSION TO BRACHIOCEPHALIC ARTERIOVENOUS FISTULA (Left Arm Upper)     Patient location during evaluation: PACU Anesthesia Type: Regional Level of consciousness: awake and alert Pain management: pain level controlled Vital Signs Assessment: post-procedure vital signs reviewed and stable Respiratory status: spontaneous breathing and respiratory function stable Cardiovascular status: stable Postop Assessment: no apparent nausea or vomiting Anesthetic complications: no   No complications documented.  Last Vitals:  Vitals:   01/10/21 1115 01/10/21 1130  BP: 120/67 122/70  Pulse: (!) 56 60  Resp: (!) 21 18  Temp:    SpO2: 91% (!) 89%    Last Pain:  Vitals:   01/10/21 1115  TempSrc:   PainSc: 6                  [name]

## 2021-01-10 NOTE — Transfer of Care (Signed)
Immediate Anesthesia Transfer of Care Note  Patient: Thomas Robinson  Procedure(s) Performed: LIGATION OF LEFT ARM RADIOCEPHALIC FISTULA (Left Arm Upper) CONVERSION TO BRACHIOCEPHALIC ARTERIOVENOUS FISTULA (Left Arm Upper)  Patient Location: PACU  Anesthesia Type:MAC  Level of Consciousness: drowsy and patient cooperative  Airway & Oxygen Therapy: Patient Spontanous Breathing and Patient connected to face mask oxygen  Post-op Assessment: Report given to RN and Post -op Vital signs reviewed and stable  Post vital signs: Reviewed and stable  Last Vitals:  Vitals Value Taken Time  BP 105/59 01/10/21 1043  Temp    Pulse 66 01/10/21 1044  Resp 24 01/10/21 1044  SpO2 98 % 01/10/21 1044  Vitals shown include unvalidated device data.  Last Pain:  Vitals:   01/10/21 0611  TempSrc:   PainSc: 0-No pain      Patients Stated Pain Goal: 5 (12/45/80 9983)  Complications: No complications documented.

## 2021-01-10 NOTE — Op Note (Signed)
    Patient name: Thomas Robinson MRN: [id number] DOB: 1961-06-26 Sex: male  01/10/2021 Pre-operative Diagnosis: End-stage renal disease Post-operative diagnosis:  Same Surgeon:  [name] Assistants: Aldona Bar Ryne Procedure:   #1: Left brachiocephalic fistula   #2: Ligation of left radiocephalic fistula Anesthesia: Regional Blood Loss: Minimal Specimens: Non  Indications: This is a 60 year old gentleman on dialysis via a left radiocephalic fistula.  He has significant swelling and pain from his elbow down.  He is undergone a fistulogram that did not show any lesions within his fistula.  I suspect that communication to the deep system via multiple branches at the antecubital crease and in the forearm is causing him swelling.  Therefore I proposed ligating the radiocephalic fistula and creating a brachiocephalic fistula, understanding that this may not completely alleviate his symptoms.  Procedure:  The patient was identified in the holding area and taken to [place] [address]  The patient was then placed supine on the table. regional anesthesia was administered.  The patient was prepped and draped in the usual sterile fashion.  A time out was called and antibiotics were administered.  A PA was necessary to expedite the procedure and assist with technical details.  1% lidocaine was used local anesthesia.  I began by making a transverse incision just proximal to the antecubital crease.  Cautery was used to divide the subcutaneous tissue down to the brachial artery which was moderately calcified.  It measured about 5 mm.  I then exposed the median cubital vein and mobilized it throughout the width of the incision.  I dissected back until I found the communication to the cephalic vein and then ligated this branch between silk ties.  Next the median cubital vein was ligated distally after marking it for orientation.  It distended nicely with heparin saline.  The brachial artery was then occluded with  vascular clamps and a #11 blade was used to make an arteriotomy.  This was opened longitudinally with Potts scissors.  The vein was then spatulated and cut to the appropriate length and a end-to-side anastomosis was created with 6-0 Prolene.  Prior to completion the appropriate flushing maneuvers were performed and the anastomosis was completed.  There was an excellent thrill within the fistula.  I inspected the course of the vein to make sure there were no kinks or stenoses.  Next, a transverse incision was made at the wrist.  Through this incision I dissected out the cephalic vein.  I dissected down to the arteriovenous anastomosis.  The vein was then occluded with vascular clamps and transected.  The distal end was ligated with a silk tie.  The proximal end was oversewn in 2 layers of 6-0 Prolene.  At this point, the patient had an excellent thrill within his left brachiocephalic fistula and a palpable left radial pulse.  The wounds were then irrigated.  Hemostasis was achieved.  The incisions were closed with 2 layers of Vicryl followed by Dermabond.  An Ace wrap was placed on the arm.  There were no immediate complications.   Disposition: To PACU stable.   Theotis Burrow, M.D., [facility] [place] Office: [phone] Pager:  [phone]

## 2021-01-10 NOTE — Discharge Instructions (Signed)
[facility]  Discharge Instructions  AV Fistula or Graft Surgery for Dialysis Access  Please refer to the following instructions for your post-procedure care. Your surgeon or physician assistant will discuss any changes with you.  Activity  You may drive the day following your surgery, if you are comfortable and no longer taking prescription pain medication. Resume full activity as the soreness in your incision resolves.  Bathing/Showering  You may shower after you go home. Keep your incision dry for 48 hours. Do not soak in a bathtub, hot tub, or swim until the incision heals completely. You may not shower if you have a hemodialysis catheter.  Incision Care  Clean your incision with mild soap and water after 48 hours. Pat the area dry with a clean towel. You do not need a bandage unless otherwise instructed. Do not apply any ointments or creams to your incision. You may have skin glue on your incision. Do not peel it off. It will come off on its own in about one week. Your arm may swell a bit after surgery. To reduce swelling use pillows to elevate your arm so it is above your heart. Your doctor will tell you if you need to lightly wrap your arm with an ACE bandage.   Leave ace wrap on until you go to dialysis tomorrow then remove.   Diet  Resume your normal diet. There are not special food restrictions following this procedure. In order to heal from your surgery, it is CRITICAL to get adequate nutrition. Your body requires vitamins, minerals, and protein. Vegetables are the best source of vitamins and minerals. Vegetables also provide the perfect balance of protein. Processed food has little nutritional value, so try to avoid this.  Medications  Resume taking all of your medications. If your incision is causing pain, you may take over-the counter pain relievers such as acetaminophen (Tylenol). If you were prescribed a stronger pain medication, please  be aware these medications can cause nausea and constipation. Prevent nausea by taking the medication with a snack or meal. Avoid constipation by drinking plenty of fluids and eating foods with high amount of fiber, such as fruits, vegetables, and grains.  Do not take Tylenol if you are taking prescription pain medications.  Follow up Your surgeon may want to see you in the office following your access surgery. If so, this will be arranged at the time of your surgery.  Please call us immediately for any of the following conditions:  . Increased pain, redness, drainage (pus) from your incision site . Fever of 101 degrees or higher . Severe or worsening pain at your incision site . Hand pain or numbness. .  Reduce your risk of vascular disease:  . Stop smoking. If you would like help, call [facility] at 1-800-QUIT-NOW 609-205-1764) or [facility] at 310 523 8626  . Manage your cholesterol . Maintain a desired weight . Control your diabetes . Keep your blood pressure down  Dialysis  It will take several weeks to several months for your new dialysis access to be ready for use. Your surgeon will determine when it is okay to use it. Your nephrologist will continue to direct your dialysis. You can continue to use your Permcath until your new access is ready for use.   01/10/2021 Thomas Robinson [id number] [birth year]  Surgeon(s): Serafina Mitchell, MD  Procedure(s): LIGATION OF LEFT ARM RADIOCEPHALIC FISTULA CONVERSION TO BRACHIOCEPHALIC ARTERIOVENOUS FISTULA  x May stick fistula immediately   If  you have any questions, please call the office at [phone].

## 2021-01-11 ENCOUNTER — Encounter (HOSPITAL_COMMUNITY): Payer: Self-pay | Admitting: Surgery

## 2021-01-12 ENCOUNTER — Encounter (HOSPITAL_COMMUNITY): Payer: Self-pay | Admitting: Internal Medicine

## 2021-01-12 ENCOUNTER — Inpatient Hospital Stay (HOSPITAL_COMMUNITY)
Admission: EM | Admit: 2021-01-12 | Discharge: 2021-01-15 | DRG: 252 | Disposition: A | Discharge status: Home / Self Care | Payer: Medicare Other | Attending: Internal Medicine | Admitting: Internal Medicine

## 2021-01-12 ENCOUNTER — Emergency Department (HOSPITAL_COMMUNITY): Payer: Medicare Other

## 2021-01-12 ENCOUNTER — Other Ambulatory Visit: Payer: Self-pay

## 2021-01-12 DIAGNOSIS — Z09 Encounter for follow-up examination after completed treatment for conditions other than malignant neoplasm: Secondary | ICD-10-CM

## 2021-01-12 DIAGNOSIS — J811 Chronic pulmonary edema: Secondary | ICD-10-CM

## 2021-01-12 DIAGNOSIS — I248 Other forms of acute ischemic heart disease: Secondary | ICD-10-CM | POA: Diagnosis present

## 2021-01-12 DIAGNOSIS — J9621 Acute and chronic respiratory failure with hypoxia: Secondary | ICD-10-CM | POA: Diagnosis present

## 2021-01-12 DIAGNOSIS — Z8673 Personal history of transient ischemic attack (TIA), and cerebral infarction without residual deficits: Secondary | ICD-10-CM

## 2021-01-12 DIAGNOSIS — Z9981 Dependence on supplemental oxygen: Secondary | ICD-10-CM

## 2021-01-12 DIAGNOSIS — N186 End stage renal disease: Secondary | ICD-10-CM | POA: Diagnosis not present

## 2021-01-12 DIAGNOSIS — S51812A Laceration without foreign body of left forearm, initial encounter: Secondary | ICD-10-CM | POA: Diagnosis present

## 2021-01-12 DIAGNOSIS — Z20822 Contact with and (suspected) exposure to covid-19: Secondary | ICD-10-CM | POA: Diagnosis present

## 2021-01-12 DIAGNOSIS — J9601 Acute respiratory failure with hypoxia: Secondary | ICD-10-CM

## 2021-01-12 DIAGNOSIS — R52 Pain, unspecified: Secondary | ICD-10-CM

## 2021-01-12 DIAGNOSIS — Z9115 Patient's noncompliance with renal dialysis: Secondary | ICD-10-CM

## 2021-01-12 DIAGNOSIS — J9620 Acute and chronic respiratory failure, unspecified whether with hypoxia or hypercapnia: Secondary | ICD-10-CM | POA: Diagnosis present

## 2021-01-12 DIAGNOSIS — S060X0A Concussion without loss of consciousness, initial encounter: Secondary | ICD-10-CM | POA: Insufficient documentation

## 2021-01-12 DIAGNOSIS — Z992 Dependence on renal dialysis: Secondary | ICD-10-CM

## 2021-01-12 DIAGNOSIS — Y92009 Unspecified place in unspecified non-institutional (private) residence as the place of occurrence of the external cause: Secondary | ICD-10-CM

## 2021-01-12 DIAGNOSIS — I10 Essential (primary) hypertension: Secondary | ICD-10-CM

## 2021-01-12 DIAGNOSIS — N185 Chronic kidney disease, stage 5: Secondary | ICD-10-CM | POA: Diagnosis not present

## 2021-01-12 DIAGNOSIS — W19XXXA Unspecified fall, initial encounter: Secondary | ICD-10-CM | POA: Diagnosis present

## 2021-01-12 DIAGNOSIS — R001 Bradycardia, unspecified: Secondary | ICD-10-CM | POA: Diagnosis present

## 2021-01-12 DIAGNOSIS — I5033 Acute on chronic diastolic (congestive) heart failure: Secondary | ICD-10-CM | POA: Diagnosis not present

## 2021-01-12 DIAGNOSIS — I871 Compression of vein: Secondary | ICD-10-CM | POA: Diagnosis present

## 2021-01-12 DIAGNOSIS — T82868A Thrombosis of vascular prosthetic devices, implants and grafts, initial encounter: Secondary | ICD-10-CM

## 2021-01-12 DIAGNOSIS — D631 Anemia in chronic kidney disease: Secondary | ICD-10-CM | POA: Diagnosis present

## 2021-01-12 DIAGNOSIS — Z981 Arthrodesis status: Secondary | ICD-10-CM

## 2021-01-12 DIAGNOSIS — I132 Hypertensive heart and chronic kidney disease with heart failure and with stage 5 chronic kidney disease, or end stage renal disease: Principal | ICD-10-CM | POA: Diagnosis present

## 2021-01-12 LAB — PROTIME-INR
INR: 1.2 (ref 0.8–1.2)
Prothrombin Time: 14.7 seconds (ref 11.4–15.2)

## 2021-01-12 LAB — I-STAT ARTERIAL BLOOD GAS, ED
Acid-Base Excess: 3 mmol/L — ABNORMAL HIGH (ref 0.0–2.0)
Bicarbonate: 26.2 mmol/L (ref 20.0–28.0)
Calcium, Ion: 1.17 mmol/L (ref 1.15–1.40)
HCT: 29 % — ABNORMAL LOW (ref 39.0–52.0)
Hemoglobin: 9.9 g/dL — ABNORMAL LOW (ref 13.0–17.0)
O2 Saturation: 98 %
Patient temperature: 97.7
Potassium: 4.8 mmol/L (ref 3.5–5.1)
Sodium: 135 mmol/L (ref 135–145)
TCO2: 27 mmol/L (ref 22–32)
pCO2 arterial: 34.8 mmHg (ref 32.0–48.0)
pH, Arterial: 7.482 — ABNORMAL HIGH (ref 7.350–7.450)
pO2, Arterial: 100 mmHg (ref 83.0–108.0)

## 2021-01-12 LAB — COMPREHENSIVE METABOLIC PANEL
ALT: <5 U/L (ref 0–44)
AST: 19 U/L (ref 15–41)
Albumin: 3.5 g/dL (ref 3.5–5.0)
Alkaline Phosphatase: 71 U/L (ref 38–126)
Anion gap: 14 (ref 5–15)
BUN: 58 mg/dL — ABNORMAL HIGH (ref 8–23)
CO2: 25 mmol/L (ref 22–32)
Calcium: 9.6 mg/dL (ref 8.9–10.3)
Chloride: 98 mmol/L (ref 98–111)
Creatinine, Ser: 8.63 mg/dL — ABNORMAL HIGH (ref 0.61–1.24)
GFR, Estimated: 6 mL/min — ABNORMAL LOW (ref >60)
Glucose, Bld: 88 mg/dL (ref 70–99)
Potassium: 5.1 mmol/L (ref 3.5–5.1)
Sodium: 137 mmol/L (ref 135–145)
Total Bilirubin: 1.4 mg/dL — ABNORMAL HIGH (ref 0.3–1.2)
Total Protein: 6.4 g/dL — ABNORMAL LOW (ref 6.5–8.1)

## 2021-01-12 LAB — I-STAT CHEM 8, ED
BUN: 59 mg/dL — ABNORMAL HIGH (ref 8–23)
Calcium, Ion: 1.07 mmol/L — ABNORMAL LOW (ref 1.15–1.40)
Chloride: 98 mmol/L (ref 98–111)
Creatinine, Ser: 8.6 mg/dL — ABNORMAL HIGH (ref 0.61–1.24)
Glucose, Bld: 85 mg/dL (ref 70–99)
HCT: 28 % — ABNORMAL LOW (ref 39.0–52.0)
Hemoglobin: 9.5 g/dL — ABNORMAL LOW (ref 13.0–17.0)
Potassium: 4.8 mmol/L (ref 3.5–5.1)
Sodium: 134 mmol/L — ABNORMAL LOW (ref 135–145)
TCO2: 26 mmol/L (ref 22–32)

## 2021-01-12 LAB — CBC WITH DIFFERENTIAL/PLATELET
Abs Immature Granulocytes: 0.05 10*3/uL (ref 0.00–0.07)
Basophils Absolute: 0.1 10*3/uL (ref 0.0–0.1)
Basophils Relative: 1 %
Eosinophils Absolute: 0.1 10*3/uL (ref 0.0–0.5)
Eosinophils Relative: 2 %
HCT: 29.9 % — ABNORMAL LOW (ref 39.0–52.0)
Hemoglobin: 9.5 g/dL — ABNORMAL LOW (ref 13.0–17.0)
Immature Granulocytes: 1 %
Lymphocytes Relative: 8 %
Lymphs Abs: 0.5 10*3/uL — ABNORMAL LOW (ref 0.7–4.0)
MCH: 35.4 pg — ABNORMAL HIGH (ref 26.0–34.0)
MCHC: 31.8 g/dL (ref 30.0–36.0)
MCV: 111.6 fL — ABNORMAL HIGH (ref 80.0–100.0)
Monocytes Absolute: 0.4 10*3/uL (ref 0.1–1.0)
Monocytes Relative: 6 %
Neutro Abs: 5.4 10*3/uL (ref 1.7–7.7)
Neutrophils Relative %: 82 %
Platelets: 183 10*3/uL (ref 150–400)
RBC: 2.68 MIL/uL — ABNORMAL LOW (ref 4.22–5.81)
RDW: 15.7 % — ABNORMAL HIGH (ref 11.5–15.5)
WBC: 6.5 10*3/uL (ref 4.0–10.5)
nRBC: 0.3 % — ABNORMAL HIGH (ref 0.0–0.2)

## 2021-01-12 LAB — CBG MONITORING, ED: Glucose-Capillary: 88 mg/dL (ref 70–99)

## 2021-01-12 LAB — RESP PANEL BY RT-PCR (FLU A&B, COVID) ARPGX2
Influenza A by PCR: NEGATIVE
Influenza B by PCR: NEGATIVE
SARS Coronavirus 2 by RT PCR: NEGATIVE

## 2021-01-12 LAB — LACTIC ACID, PLASMA: Lactic Acid, Venous: 1.3 mmol/L (ref 0.5–1.9)

## 2021-01-12 LAB — CK: Total CK: 42 U/L — ABNORMAL LOW (ref 49–397)

## 2021-01-12 MED ORDER — VITAMIN D 25 MCG (1000 UNIT) PO TABS
1000.0000 [IU] | ORAL_TABLET | Freq: Every day | ORAL | Status: DC
  Administered 2021-01-13 – 2021-01-15 (×3): 1000 [IU] via ORAL
  Filled 2021-01-12 (×3): qty 1

## 2021-01-12 MED ORDER — ONDANSETRON HCL 4 MG PO TABS: 4.0000 mg | ORAL_TABLET | Freq: Four times a day (QID) | ORAL | Status: DC | PRN

## 2021-01-12 MED ORDER — HEPARIN SODIUM (PORCINE) 1000 UNIT/ML DIALYSIS: 2000.0000 [IU] | INTRAMUSCULAR | Status: DC | PRN

## 2021-01-12 MED ORDER — FLUTICASONE FUROATE-VILANTEROL 100-25 MCG/INH IN AEPB
1.0000 | INHALATION_SPRAY | Freq: Every day | RESPIRATORY_TRACT | Status: DC
  Administered 2021-01-13 – 2021-01-15 (×3): 1 via RESPIRATORY_TRACT
  Filled 2021-01-12 (×2): qty 28

## 2021-01-12 MED ORDER — MORPHINE SULFATE (PF) 4 MG/ML IV SOLN
4.0000 mg | Freq: Once | INTRAVENOUS | Status: AC
  Administered 2021-01-12: 4 mg via INTRAVENOUS
  Filled 2021-01-12: qty 1

## 2021-01-12 MED ORDER — METOPROLOL TARTRATE 100 MG PO TABS
100.0000 mg | ORAL_TABLET | Freq: Two times a day (BID) | ORAL | Status: DC
  Administered 2021-01-12 – 2021-01-13 (×3): 100 mg via ORAL
  Filled 2021-01-12 (×2): qty 1
  Filled 2021-01-12: qty 4
  Filled 2021-01-12: qty 1

## 2021-01-12 MED ORDER — CINACALCET HCL 30 MG PO TABS
60.0000 mg | ORAL_TABLET | Freq: Every day | ORAL | Status: DC
  Administered 2021-01-13 – 2021-01-14 (×2): 60 mg via ORAL
  Filled 2021-01-12 (×2): qty 2

## 2021-01-12 MED ORDER — ALBUTEROL SULFATE HFA 108 (90 BASE) MCG/ACT IN AERS
2.0000 | INHALATION_SPRAY | Freq: Four times a day (QID) | RESPIRATORY_TRACT | Status: DC | PRN
  Filled 2021-01-12 (×2): qty 6.7

## 2021-01-12 MED ORDER — FERRIC CITRATE 1 GM 210 MG(FE) PO TABS
420.0000 mg | ORAL_TABLET | Freq: Three times a day (TID) | ORAL | Status: DC
  Administered 2021-01-13 – 2021-01-15 (×7): 420 mg via ORAL
  Filled 2021-01-12 (×6): qty 2

## 2021-01-12 MED ORDER — HYDROMORPHONE HCL 1 MG/ML IJ SOLN
1.0000 mg | Freq: Once | INTRAMUSCULAR | Status: AC
  Administered 2021-01-12: 1 mg via INTRAVENOUS
  Filled 2021-01-12: qty 1

## 2021-01-12 MED ORDER — TRAMADOL HCL 50 MG PO TABS
50.0000 mg | ORAL_TABLET | Freq: Three times a day (TID) | ORAL | Status: DC | PRN
  Administered 2021-01-12 – 2021-01-14 (×3): 50 mg via ORAL
  Filled 2021-01-12 (×3): qty 1

## 2021-01-12 MED ORDER — PREGABALIN 25 MG PO CAPS: 25.0000 mg | ORAL_CAPSULE | Freq: Every day | ORAL | Status: DC | PRN

## 2021-01-12 MED ORDER — HEPARIN SODIUM (PORCINE) 1000 UNIT/ML IJ SOLN
INTRAMUSCULAR | Status: AC
  Filled 2021-01-12: qty 4

## 2021-01-12 MED ORDER — GABAPENTIN 300 MG PO CAPS: 300.0000 mg | ORAL_CAPSULE | Freq: Two times a day (BID) | ORAL | Status: DC | PRN

## 2021-01-12 MED ORDER — ONDANSETRON HCL 4 MG/2ML IJ SOLN
4.0000 mg | Freq: Once | INTRAMUSCULAR | Status: AC
  Administered 2021-01-12: 4 mg via INTRAVENOUS
  Filled 2021-01-12: qty 2

## 2021-01-12 MED ORDER — SEVELAMER CARBONATE 800 MG PO TABS
800.0000 mg | ORAL_TABLET | Freq: Every day | ORAL | Status: DC
  Administered 2021-01-13 – 2021-01-14 (×2): 800 mg via ORAL
  Filled 2021-01-12 (×2): qty 1

## 2021-01-12 MED ORDER — ACETAMINOPHEN 650 MG RE SUPP: 650.0000 mg | Freq: Four times a day (QID) | RECTAL | Status: DC | PRN

## 2021-01-12 MED ORDER — HYDROCODONE-ACETAMINOPHEN 5-325 MG PO TABS
1.0000 | ORAL_TABLET | Freq: Four times a day (QID) | ORAL | Status: DC | PRN
Start: 2021-01-12 — End: 2021-01-15
  Administered 2021-01-13 – 2021-01-14 (×4): 1 via ORAL
  Filled 2021-01-12 (×4): qty 1

## 2021-01-12 MED ORDER — FUROSEMIDE 10 MG/ML IJ SOLN: 80.0000 mg | Freq: Once | INTRAMUSCULAR | Status: DC

## 2021-01-12 MED ORDER — HYDRALAZINE HCL 50 MG PO TABS
50.0000 mg | ORAL_TABLET | Freq: Two times a day (BID) | ORAL | Status: DC
  Administered 2021-01-12 – 2021-01-15 (×6): 50 mg via ORAL
  Filled 2021-01-12 (×2): qty 1
  Filled 2021-01-12: qty 2
  Filled 2021-01-12 (×3): qty 1

## 2021-01-12 MED ORDER — LABETALOL HCL 5 MG/ML IV SOLN
10.0000 mg | Freq: Once | INTRAVENOUS | Status: AC
  Administered 2021-01-12: 10 mg via INTRAVENOUS
  Filled 2021-01-12: qty 4

## 2021-01-12 MED ORDER — RENA-VITE PO TABS
1.0000 | ORAL_TABLET | Freq: Every day | ORAL | Status: DC
  Administered 2021-01-12 – 2021-01-15 (×4): 1 via ORAL
  Filled 2021-01-12 (×4): qty 1

## 2021-01-12 MED ORDER — ONDANSETRON HCL 4 MG/2ML IJ SOLN
4.0000 mg | Freq: Four times a day (QID) | INTRAMUSCULAR | Status: DC | PRN
  Administered 2021-01-13: 4 mg via INTRAVENOUS
  Filled 2021-01-12: qty 2

## 2021-01-12 MED ORDER — CHLORHEXIDINE GLUCONATE CLOTH 2 % EX PADS: 6.0000 | MEDICATED_PAD | Freq: Every day | CUTANEOUS | Status: DC

## 2021-01-12 MED ORDER — HYDROCODONE-ACETAMINOPHEN 5-325 MG PO TABS: 1.0000 | ORAL_TABLET | Freq: Four times a day (QID) | ORAL | Status: DC | PRN

## 2021-01-12 MED ORDER — HEPARIN SODIUM (PORCINE) 5000 UNIT/ML IJ SOLN
5000.0000 [IU] | Freq: Three times a day (TID) | INTRAMUSCULAR | Status: DC
  Administered 2021-01-13 – 2021-01-15 (×7): 5000 [IU] via SUBCUTANEOUS
  Filled 2021-01-12 (×6): qty 1

## 2021-01-12 MED ORDER — ACETAMINOPHEN 325 MG PO TABS
650.0000 mg | ORAL_TABLET | Freq: Four times a day (QID) | ORAL | Status: DC | PRN
  Filled 2021-01-12: qty 2

## 2021-01-12 MED ORDER — AMLODIPINE BESYLATE 10 MG PO TABS
10.0000 mg | ORAL_TABLET | Freq: Every day | ORAL | Status: DC
  Administered 2021-01-13 – 2021-01-15 (×3): 10 mg via ORAL
  Filled 2021-01-12 (×3): qty 1

## 2021-01-12 NOTE — ED Provider Notes (Signed)
[facility] EMERGENCY DEPARTMENT Provider Note   CSN: [id number] Arrival date & time: 01/12/21  1651     History No chief complaint on file.   Thomas Robinson is a 60 y.o. male.  Pt presents to the ED today with AMS.  The pt lives with a friend and the friend could not tell EMS much about what was going on with patient.  Pt was LSN last night around 11 when the friend heard him moving around.  The friend found him on the ground sometime this afternoon.  It is unclear how long he was on the ground.  He does not remember what happened.  He does have a hx of ESRD on HD.  He normally goes to dialysis Tu, Th, Sat, but said he went yesterday (Friday).  Pt did have surgery on his left AVF on 5/12.  Pt has been having pain and swelling to the left arm for months.  Dr. Trula Slade did a ligation of his left arm radiocephalic fistula and converted it to a brachiocephalic AVG.  Pt c/o pain to the left arm and there is a skin tear to his left arm.        Past Medical History:  Diagnosis Date  . Anaphylactic shock, unspecified, sequela 06/10/2019  . Arthritis   . Asthma, chronic, unspecified asthma severity, with acute exacerbation 10/23/2017  . CAP (community acquired pneumonia) 10/23/2017  . Carpal tunnel syndrome of right wrist 10/05/2018  . Cataract    right - removed by surgery  . Cerebral infarction due to thrombosis of cerebral artery ([place])   . Cervical disc herniation 01/04/2020  . Chronic diastolic heart failure ([place]) 04/13/2018  . Chronic low back pain 11/24/2019  . Constipation   . Cough    chronic cough  . Encounter for immunization 07/08/2017  . ESRD on hemodialysis ([name]) 02/16/2018   Dialysis  T Thus Sat  - [facility]   . Fall 06/04/2019  . GERD (gastroesophageal reflux disease) 06/04/2019  . GI bleeding 05/24/2017  . Gram-negative sepsis, unspecified ([place]) 09/05/2017  . History of fusion of cervical spine 03/26/2020  . Hyperlipidemia   . Hypertension   .  Hypokalemia 06/04/2017  . Iron deficiency anemia, unspecified 09/09/2017  . Left shoulder pain 11/11/2019  . Lumbar radiculopathy 11/24/2019  . Lung contusion 06/04/2019  . LVH (left ventricular hypertrophy) due to hypertensive disease, with heart failure ([place]) 06/18/2020  . Macrocytic anemia 05/24/2017  . Moderate protein-calorie malnutrition ([place]) 06/06/2017  . Myofascial pain syndrome 06/12/2020  . Neuritis of right ulnar nerve 09/13/2018  . Non-compliance with renal dialysis ([place]) 02/08/2020  . Oxygen deficiency 12/30/2019   O2 sats on RA 87% at PAT appt   . Pain in joint of right elbow 09/13/2018  . Pulmonary hypertension ([place])   . Renovascular hypertension 06/22/2020  . Restless leg syndrome   . Rib fractures 06/2019   Right  . Secondary hyperparathyroidism of renal origin ([place]) 06/04/2017  . Thoracic ascending aortic aneurysm (HCC)    4.5 cm 06/04/19 CT  . Ulnar neuropathy 10/05/2018  . Volume overload 10/23/2017    Patient Active Problem List   Diagnosis Date Noted  . Age-related physical debility 09/20/2020  . Allergy, unspecified, initial encounter 09/20/2020  . Nausea 09/20/2020  . Pain, unspecified 09/20/2020  . Pruritus, unspecified 09/20/2020  . Acute encephalopathy 08/26/2020  . Peripheral neuropathy 08/26/2020  . Elevated troponin 08/25/2020  . Hyperkalemia 08/05/2020  . Arthritis   . Asthma   .  Cataract   . Cerebral infarction due to thrombosis of cerebral artery ([place])   . History of CVA (cerebrovascular accident)   . Chronic kidney disease   . Dyspnea   . Hyperlipidemia   . Hypertension   . Kidney failure   . Restless leg syndrome   . Degenerative lumbar spinal stenosis 06/26/2020  . Diastolic dysfunction 69/62/9528  . Renovascular hypertension 06/22/2020  . Demand ischemia ([place]) 06/18/2020  . LVH (left ventricular hypertrophy) due to hypertensive disease, with heart failure ([place]) 06/18/2020  . Myofascial pain syndrome 06/12/2020  . Encounter for removal of  sutures 03/29/2020  . History of fusion of cervical spine 03/26/2020  . Non-compliance with renal dialysis ([place]) 02/08/2020  . Acute pulmonary edema ([place]) 01/23/2020  . Cervical disc herniation 01/04/2020  . Tachycardia 01/04/2020  . SOB (shortness of breath)   . Oxygen deficiency 12/30/2019  . Chronic low back pain 11/24/2019  . Degeneration of lumbar intervertebral disc 11/24/2019  . Lumbar radiculopathy 11/24/2019  . Left shoulder pain 11/11/2019  . Anaphylactic shock, unspecified, sequela 06/10/2019  . Rib fractures 06/05/2019  . Fall at home, initial encounter 06/04/2019  . Right rib fracture 06/04/2019  . Lung contusion 06/04/2019  . Acute respiratory failure with hypoxia ([place]) 06/04/2019  . Anemia in ESRD (end-stage renal disease) ([place]) 06/04/2019  . GERD (gastroesophageal reflux disease) 06/04/2019  . Chronic diastolic (congestive) heart failure ([place]) 06/04/2019  . Fall 06/04/2019  . Thoracic ascending aortic aneurysm ([place]) 06/04/2019  . Acute exacerbation of congestive heart failure ([place]) 05/16/2019  . Carpal tunnel syndrome of right wrist 10/05/2018  . Ulnar neuropathy 10/05/2018  . Neuritis of right ulnar nerve 09/13/2018  . Pain in joint of right elbow 09/13/2018  . Chest pain 04/13/2018  . Chronic diastolic heart failure ([place]) 04/13/2018  . Acute on chronic diastolic CHF (congestive heart failure) ([place]) 04/13/2018  . Chest discomfort   . Atypical chest pain 02/16/2018  . ESRD on dialysis ([place]) 02/16/2018  . ESRD (end stage renal disease) on dialysis ([place]) 02/16/2018  . Volume overload 10/23/2017  . CAP (community acquired pneumonia) 10/23/2017  . Asthma, chronic, unspecified asthma severity, with acute exacerbation 10/23/2017  . Respiratory failure, acute ([place]) 10/23/2017  . Iron deficiency anemia, unspecified 09/09/2017  . Gram-negative sepsis, unspecified ([place]) 09/05/2017  . Cough 08/23/2017  . Encounter for immunization 07/08/2017  . Moderate protein-calorie  malnutrition ([place]) 06/06/2017  . Coagulation defect, unspecified ([place]) 06/04/2017  . Hypokalemia 06/04/2017  . Other hyperlipidemia 06/04/2017  . Secondary hyperparathyroidism of renal origin ([place]) 06/04/2017  . Constipation   . Macrocytic anemia 05/24/2017  . Uremia 05/24/2017  . Hypertensive urgency 05/24/2017  . Acute kidney injury superimposed on CKD ([place]) 05/24/2017  . CKD (chronic kidney disease), stage V ([place]) 05/24/2017  . GI bleeding 05/24/2017  . Symptomatic anemia 05/24/2017  . Stroke ([place]) 2018  . AKI (acute kidney injury) ([place]) 04/10/2015  . History of completed stroke   . Essential hypertension 04/09/2015    Past Surgical History:  Procedure Laterality Date  . A/V FISTULAGRAM N/A 01/01/2021   Procedure: A/V FISTULAGRAM;  Surgeon: Serafina Mitchell, MD;  Location: [facility];  Service: Cardiovascular;  Laterality: N/A;  . ANTERIOR CERVICAL DECOMP/DISCECTOMY FUSION N/A 01/04/2020   Procedure: ANTERIOR CERVICAL DECOMPRESSION/DISCECTOMY FUSION CERVICAL FIVE THROUGH SEVEN;  Surgeon: Melina Schools, MD;  Location: [facility];  Service: Orthopedics;  Laterality: N/A;  3 hrs  . AV FISTULA PLACEMENT Left 05/28/2017   Procedure: LEFT ARM ARTERIOVENOUS (AV) FISTULA  CREATION;  Surgeon: Conrad , MD;  Location: [facility];  Service: Vascular;  Laterality: Left;  . EYE SURGERY Right 06/02/2019   Cataract removed  . FRACTURE SURGERY    . IR DIALY SHUNT INTRO NEEDLE/INTRACATH INITIAL W/IMG LEFT Left 09/12/2020  . IR FLUORO GUIDE CV LINE RIGHT  05/25/2017  . IR US GUIDE VASC ACCESS LEFT  09/12/2020  . IR US GUIDE VASC ACCESS RIGHT  05/25/2017  . LIGATION OF ARTERIOVENOUS  FISTULA Left 01/10/2021   Procedure: LIGATION OF LEFT ARM RADIOCEPHALIC FISTULA;  Surgeon: Serafina Mitchell, MD;  Location: [facility];  Service: Vascular;  Laterality: Left;  . REVISON OF ARTERIOVENOUS FISTULA Left 07/18/2019   Procedure: REVISION PLICATION OF RADIOCEPHALIC ARTERIOVENOUS FISTULA LEFT ARM;  Surgeon: Angelia Mould, MD;  Location: [facility];  Service: Vascular;  Laterality: Left;  . REVISON OF ARTERIOVENOUS FISTULA Left 01/10/2021   Procedure: CONVERSION TO BRACHIOCEPHALIC ARTERIOVENOUS FISTULA;  Surgeon: Serafina Mitchell, MD;  Location: [facility];  Service: Vascular;  Laterality: Left;  . RINOPLASTY         Family History  Problem Relation Age of Onset  . Cancer Mother     Social History   Tobacco Use  . Smoking status: Never Smoker  . Smokeless tobacco: Never Used  Vaping Use  . Vaping Use: Never used  Substance Use Topics  . Alcohol use: Not Currently    Alcohol/week: 0.0 standard drinks    Comment: has a drink once in a while- 12/30/19  . Drug use: No    Home Medications Prior to Admission medications   Medication Sig Start Date End Date Taking? Authorizing Provider  albuterol (VENTOLIN HFA) 108 (90 Base) MCG/ACT inhaler Inhale 2 puffs into the lungs every 6 (six) hours as needed for wheezing or shortness of breath.     [provider]  amLODipine (NORVASC) 10 MG tablet Take 10 mg by mouth daily.     [provider]  atorvastatin (LIPITOR) 20 MG tablet Take 20 mg by mouth at bedtime. 04/19/20   [provider]  AURYXIA 1 GM 210 MG(Fe) tablet Take 420 mg by mouth 3 (three) times daily with meals.  03/16/20   [provider]  BREO ELLIPTA 100-25 MCG/INH AEPB Inhale 1 puff into the lungs daily. 06/26/20   [provider]  cholecalciferol (VITAMIN D3) 25 MCG (1000 UNIT) tablet Take 1,000 Units by mouth daily.    [provider]  cinacalcet (SENSIPAR) 60 MG tablet Take 60 mg by mouth daily.    [provider]  DIPHENHYDRAMINE HCL PO Take by mouth as needed. 09/24/20 09/23/21  [provider]  doxercalciferol (HECTOROL) 0.5 MCG capsule Doxercalciferol (Hectorol) 11/26/20 11/25/21  [provider]  furosemide (LASIX) 40 MG tablet Take 1 tablet (40 mg total) by mouth See admin instructions. On Sunday, Monday,  Wednesday, Thursday and Saturday 08/14/20   Tobb, Kardie, DO  gabapentin (NEURONTIN) 300 MG capsule Take 300 mg by mouth 2 (two) times daily as needed (pain).     [provider]  hydrALAZINE (APRESOLINE) 25 MG tablet Take 50 mg by mouth 3 (three) times daily.    [provider]  Methoxy PEG-Epoetin Beta (MIRCERA IJ) Mircera 10/01/20 09/30/21  [provider]  metoprolol tartrate (LOPRESSOR) 100 MG tablet Take 100 mg by mouth 2 (two) times daily.     [provider]  multivitamin (RENA-VIT) TABS tablet Take 1 tablet by mouth daily. 01/12/18   [provider]  pregabalin (LYRICA) 25 MG capsule Take 25 mg by mouth daily as needed. 11/29/20   [provider]  sevelamer carbonate (RENVELA) 800 MG tablet Take 800 mg by mouth daily. 11/07/19   [provider]  Specialty Vitamins Products (HEALTHY HEART COMPLEX PO) Take 1 tablet by mouth daily.    [provider]  traMADol (ULTRAM) 50 MG tablet Take 1 tablet (50 mg total) by mouth every 8 (eight) hours as needed. 01/10/21   Gabriel Earing, PA-C    Allergies    Patient has no known allergies.  Review of Systems   Review of Systems  Musculoskeletal:       Left arm pain  All other systems reviewed and are negative.   Physical Exam Updated Vital Signs BP (!) 184/87   Pulse 73   Temp 97.6 F (36.4 C) (Rectal)   Resp 13   Ht 5\' 8"  (1.727 m)   Wt 59 kg   SpO2 96%   BMI 19.78 kg/m   Physical Exam Vitals and nursing note reviewed.  Constitutional:      Appearance: Normal appearance.  HENT:     Head: Normocephalic and atraumatic.     Right Ear: External ear normal.     Left Ear: External ear normal.     Nose: Nose normal.     Mouth/Throat:     Mouth: Mucous membranes are moist.     Pharynx: Oropharynx is clear.  Eyes:     Extraocular Movements: Extraocular movements intact.     Conjunctiva/sclera: Conjunctivae normal.     Pupils: Pupils are equal, round, and reactive  to light.  Cardiovascular:     Rate and Rhythm: Normal rate and regular rhythm.     Pulses: Normal pulses.     Heart sounds: Normal heart sounds.  Pulmonary:     Effort: Pulmonary effort is normal.     Breath sounds: Rhonchi present.  Abdominal:     General: Abdomen is flat. Bowel sounds are normal.     Palpations: Abdomen is soft.  Musculoskeletal:     Cervical back: Normal range of motion and neck supple.     Comments: Left forearm swelling.  +thrill.  Small skin tear.  Skin:    Capillary Refill: Capillary refill takes less than 2 seconds.  Neurological:     Mental Status: He is alert. He is disoriented.     ED Results / Procedures / Treatments   Labs (all labs ordered are listed, but only abnormal results are displayed) Labs Reviewed  CBC WITH DIFFERENTIAL/PLATELET - Abnormal; Notable for the following components:      Result Value   RBC 2.68 (*)    Hemoglobin 9.5 (*)    HCT 29.9 (*)    MCV 111.6 (*)    MCH 35.4 (*)    RDW 15.7 (*)    nRBC 0.3 (*)    Lymphs Abs 0.5 (*)    All other components within normal limits  COMPREHENSIVE METABOLIC PANEL - Abnormal; Notable for the following components:   BUN 58 (*)    Creatinine, Ser 8.63 (*)    Total Protein 6.4 (*)    Total Bilirubin 1.4 (*)    GFR, Estimated 6 (*)    All other components within normal limits  CK - Abnormal; Notable for the following components:   Total CK 42 (*)    All other components within normal limits  I-STAT CHEM 8, ED - Abnormal; Notable for the following components:  Sodium 134 (*)    BUN 59 (*)    Creatinine, Ser 8.60 (*)    Calcium, Ion 1.07 (*)    Hemoglobin 9.5 (*)    HCT 28.0 (*)    All other components within normal limits  I-STAT ARTERIAL BLOOD GAS, ED - Abnormal; Notable for the following components:   pH, Arterial 7.482 (*)    Acid-Base Excess 3.0 (*)    HCT 29.0 (*)    Hemoglobin 9.9 (*)    All other components within normal limits  RESP PANEL BY RT-PCR (FLU A&B, COVID)  ARPGX2  PROTIME-INR  LACTIC ACID, PLASMA  URINALYSIS, ROUTINE W REFLEX MICROSCOPIC  BLOOD GAS, ARTERIAL  CBG MONITORING, ED    EKG EKG Interpretation  Date/Time:  Saturday Jan 12 2021 16:52:19 EDT Ventricular Rate:  69 PR Interval:  155 QRS Duration: 92 QT Interval:  430 QTC Calculation: 461 R Axis:   88 Text Interpretation: Sinus rhythm Right atrial enlargement Borderline right axis deviation Left ventricular hypertrophy Nonspecific T abnrm, anterolateral leads No significant change since last tracing Confirmed by Isla Pence [phone]) on 01/12/2021 5:39:40 PM   Radiology DG Chest 1 View  Result Date: 01/12/2021 CLINICAL DATA:  Pain following fall EXAM: CHEST  1 VIEW COMPARISON:  September 19, 2020 FINDINGS: There is cardiomegaly with pulmonary venous hypertension. The interstitium is mildly prominent without overt pulmonary edema. No appreciable airspace opacity. No adenopathy. No pneumothorax. There is a skin fold on the left. No fracture evident. Postoperative change in lower cervical spine. There is aortic atherosclerosis. IMPRESSION: Suspect underlying chronic bronchitis. No edema or consolidation. There is cardiomegaly with pulmonary vascular congestion. No pneumothorax. Aortic Atherosclerosis (ICD10-I70.0). Electronically Signed   By: Lowella Grip III M.D.   On: 01/12/2021 18:02   DG Pelvis 1-2 Views  Result Date: 01/12/2021 CLINICAL DATA:  Pain following fall EXAM: PELVIS - 1-2 VIEW COMPARISON:  None. FINDINGS: There is no evidence of pelvic fracture or dislocation. Joint spaces appear normal. Multiple foci of vascular calcification noted bilaterally. IMPRESSION: No fracture or dislocation. No evident arthropathy. Multiple foci of vascular calcification noted. Electronically Signed   By: Lowella Grip III M.D.   On: 01/12/2021 18:07   DG Elbow Complete Left  Result Date: 01/12/2021 CLINICAL DATA:  Pain following fall. Reported recent arm region surgery EXAM: LEFT  ELBOW - COMPLETE 3+ VIEW COMPARISON:  None. FINDINGS: Frontal, lateral, and bilateral oblique views were obtained. There is soft tissue swelling. There is no appreciable fracture or dislocation. No joint effusion. No joint space narrowing or erosion. Multiple foci of arterial vascular calcification noted. Note that there is soft tissue air in the volar aspect of the distal humerus. IMPRESSION: No fracture or dislocation. Soft tissue swelling and soft tissue air present. A degree of infection within the soft tissues of the distal upper arm region is questioned. Nearby surgical clips. Multiple foci of arterial vascular calcification noted. Electronically Signed   By: Lowella Grip III M.D.   On: 01/12/2021 18:05   DG Forearm Left  Result Date: 01/12/2021 CLINICAL DATA:  Pain following fall.  Recent fistula surgery EXAM: LEFT FOREARM - 2 VIEW COMPARISON:  None. FINDINGS: Frontal and lateral views were obtained. No acute fracture or dislocation. No appreciable joint space narrowing or erosion. Multiple foci of vascular calcification noted. Soft tissue air is noted volar to the distal upper extremity. IMPRESSION: Foci of soft tissue air may be have infectious etiology or may be secondary to recent surgery. Clinical assessment in this  regard warranted. No acute fracture or dislocation. No appreciable joint space narrowing or erosion. Multiple foci of arterial vascular calcification noted. Electronically Signed   By: Lowella Grip III M.D.   On: 01/12/2021 18:06   CT Head Wo Contrast  Result Date: 01/12/2021 CLINICAL DATA:  Fall EXAM: CT HEAD WITHOUT CONTRAST CT CERVICAL SPINE WITHOUT CONTRAST TECHNIQUE: Multidetector CT imaging of the head and cervical spine was performed following the standard protocol without intravenous contrast. Multiplanar CT image reconstructions of the cervical spine were also generated. COMPARISON:  None. FINDINGS: CT HEAD FINDINGS Brain: No evidence of acute infarction,  hemorrhage, hydrocephalus, extra-axial collection or mass lesion/mass effect. Periventricular white matter hypodensity. Vascular: No hyperdense vessel or unexpected calcification. Skull: Normal. Negative for fracture or focal lesion. Sinuses/Orbits: No acute finding. Other: None. CT CERVICAL SPINE FINDINGS Alignment: Degenerative straightening and reversal of the normal cervical lordosis. Skull base and vertebrae: No acute fracture. No primary bone lesion or focal pathologic process. Soft tissues and spinal canal: No prevertebral fluid or swelling. No visible canal hematoma. Disc levels: Status post anterior cervical discectomy and fusion of C5 through C7. Upper chest: Negative. Other: None. IMPRESSION: 1. No acute intracranial pathology. Small-vessel white matter disease. 2. No fracture or static subluxation of the cervical spine. 3. Status post anterior cervical discectomy and fusion of C5 through C7. Electronically Signed   By: Eddie Candle M.D.   On: 01/12/2021 17:50   CT Cervical Spine Wo Contrast  Result Date: 01/12/2021 CLINICAL DATA:  Fall EXAM: CT HEAD WITHOUT CONTRAST CT CERVICAL SPINE WITHOUT CONTRAST TECHNIQUE: Multidetector CT imaging of the head and cervical spine was performed following the standard protocol without intravenous contrast. Multiplanar CT image reconstructions of the cervical spine were also generated. COMPARISON:  None. FINDINGS: CT HEAD FINDINGS Brain: No evidence of acute infarction, hemorrhage, hydrocephalus, extra-axial collection or mass lesion/mass effect. Periventricular white matter hypodensity. Vascular: No hyperdense vessel or unexpected calcification. Skull: Normal. Negative for fracture or focal lesion. Sinuses/Orbits: No acute finding. Other: None. CT CERVICAL SPINE FINDINGS Alignment: Degenerative straightening and reversal of the normal cervical lordosis. Skull base and vertebrae: No acute fracture. No primary bone lesion or focal pathologic process. Soft tissues and  spinal canal: No prevertebral fluid or swelling. No visible canal hematoma. Disc levels: Status post anterior cervical discectomy and fusion of C5 through C7. Upper chest: Negative. Other: None. IMPRESSION: 1. No acute intracranial pathology. Small-vessel white matter disease. 2. No fracture or static subluxation of the cervical spine. 3. Status post anterior cervical discectomy and fusion of C5 through C7. Electronically Signed   By: Eddie Candle M.D.   On: 01/12/2021 17:50    Procedures Procedures   Medications Ordered in ED Medications  morphine 4 MG/ML injection 4 mg (4 mg Intravenous Given 01/12/21 1847)  ondansetron (ZOFRAN) injection 4 mg (4 mg Intravenous Given 01/12/21 1848)  HYDROmorphone (DILAUDID) injection 1 mg (1 mg Intravenous Given 01/12/21 2023)  labetalol (NORMODYNE) injection 10 mg (10 mg Intravenous Given 01/12/21 2023)    ED Course  I have reviewed the triage vital signs and the nursing notes.  Pertinent labs & imaging results that were available during my care of the patient were reviewed by me and considered in my medical decision making (see chart for details).    MDM Rules/Calculators/A&P                          Pt's CXR does show some pulmonary vascular congestion.  Pt is 82% on RA.  He is requiring 5L oxygen to keep O2 sats in the low 90s. Pt said he does not wear oxygen at home although in his chart, it says 2L.  Pt's mental status is much improved with the oxygen, although he still does not remember what happened. I suspect he has a concussion, but CT is nl.    Pt d/w Nephrology who will likely do dialysis tomorrow.  Pt d/w Dr. Alcario Drought (triad) who will admit.  CRITICAL CARE Performed by: Isla Pence   Total critical care time: 30 minutes  Critical care time was exclusive of separately billable procedures and treating other patients.  Critical care was necessary to treat or prevent imminent or life-threatening deterioration.  Critical care was time  spent personally by me on the following activities: development of treatment plan with patient and/or surrogate as well as nursing, discussions with consultants, evaluation of patient's response to treatment, examination of patient, obtaining history from patient or surrogate, ordering and performing treatments and interventions, ordering and review of laboratory studies, ordering and review of radiographic studies, pulse oximetry and re-evaluation of patient's condition.  Final Clinical Impression(s) / ED Diagnoses Final diagnoses:  Acute respiratory failure with hypoxia ([place])  ESRD on hemodialysis ([place])  Skin tear of left forearm without complication, initial encounter  Fall, initial encounter  Concussion without loss of consciousness, initial encounter    Rx / DC Orders ED Discharge Orders    None       Isla Pence, MD 01/12/21 2109

## 2021-01-12 NOTE — Progress Notes (Signed)
   01/12/21 1647  Clinical Encounter Type  Visited With Patient not available  Visit Type Trauma  Referral From Nurse  Consult/Referral To Chaplain   Chaplain responded to Level 2 trauma. Pt was being treated and no support person present. Chaplain not currently needed. Chaplain remains available.   This note was prepared by Chaplain Resident, Dante Gang, MDiv. Chaplain remains available as needed through the on-call pager: [phone].

## 2021-01-12 NOTE — ED Notes (Addendum)
Unable to get pt to sign MSE due to intermittent confusion and no family present

## 2021-01-12 NOTE — ED Notes (Signed)
Pt has small superficial abrasion on right knee.

## 2021-01-12 NOTE — ED Notes (Signed)
Patient reports he does not wear O2 at home, patient currently on 3L [place], when attempted to transition back to room air, patient sat 84%, patient placed back on 3L [place]

## 2021-01-12 NOTE — Consult Note (Signed)
Renal Service Consult Note [facility]  Kayton Dunaj 01/12/2021 [name], MD Requesting Physician: Dr. Gilford Raid  Reason for Consult: ESRD pt found down HPI: The patient is a 60 y.o. year-old w/ hx of HTN, Hl, ESRD on HD, chronic diast CHF who lives w/ a friend. The friend found him on the floor sometime this afternoon and called EMS. Normally pt has HD on MWF. In ED pt was hypoxic and CXR shows sig pulm vasc congestion and edema. Pt will be admitted, asked to see for dialysis.   Pt seen in room.  "I'm not sure what happened". Has some abrasions to the L arm.    Tells about the L arm swelling, they did access ligation to the L forearm AVF and a new left upper arm BC fistula was created, this was done 2 days ago on Thursday 5/12. Given that the left upper arm vein was already developed the access was able to be used immediately (the next day), which was yesterday.   He denies any prod cough, fevers. No abd pain or n/v/d. He likes his [facility] HD unit at [facility].  His schedule is MWF.      ROS  denies CP  no joint pain   no HA  no blurry vision  no rash  no diarrhea  no nausea/ vomiting    Past Medical History  Past Medical History:  Diagnosis Date  . Anaphylactic shock, unspecified, sequela 06/10/2019  . Arthritis   . Asthma, chronic, unspecified asthma severity, with acute exacerbation 10/23/2017  . CAP (community acquired pneumonia) 10/23/2017  . Carpal tunnel syndrome of right wrist 10/05/2018  . Cataract    right - removed by surgery  . Cerebral infarction due to thrombosis of cerebral artery ([place])   . Cervical disc herniation 01/04/2020  . Chronic diastolic heart failure ([place]) 04/13/2018  . Chronic low back pain 11/24/2019  . Constipation   . Cough    chronic cough  . Encounter for immunization 07/08/2017  . ESRD on hemodialysis ([place]) 02/16/2018   Dialysis  T Thus Sat  - [facility]   . Fall 06/04/2019  . GERD (gastroesophageal reflux  disease) 06/04/2019  . GI bleeding 05/24/2017  . Gram-negative sepsis, unspecified ([place]) 09/05/2017  . History of fusion of cervical spine 03/26/2020  . Hyperlipidemia   . Hypertension   . Hypokalemia 06/04/2017  . Iron deficiency anemia, unspecified 09/09/2017  . Left shoulder pain 11/11/2019  . Lumbar radiculopathy 11/24/2019  . Lung contusion 06/04/2019  . LVH (left ventricular hypertrophy) due to hypertensive disease, with heart failure ([place]) 06/18/2020  . Macrocytic anemia 05/24/2017  . Moderate protein-calorie malnutrition ([place]) 06/06/2017  . Myofascial pain syndrome 06/12/2020  . Neuritis of right ulnar nerve 09/13/2018  . Non-compliance with renal dialysis ([place]) 02/08/2020  . Oxygen deficiency 12/30/2019   O2 sats on RA 87% at PAT appt   . Pain in joint of right elbow 09/13/2018  . Pulmonary hypertension ([place])   . Renovascular hypertension 06/22/2020  . Restless leg syndrome   . Rib fractures 06/2019   Right  . Secondary hyperparathyroidism of renal origin ([place]) 06/04/2017  . Thoracic ascending aortic aneurysm ([facility])    4.5 cm 06/04/19 CT  . Ulnar neuropathy 10/05/2018  . Volume overload 10/23/2017   Past Surgical History  Past Surgical History:  Procedure Laterality Date  . A/V FISTULAGRAM N/A 01/01/2021   Procedure: A/V FISTULAGRAM;  Surgeon: Serafina Mitchell, MD;  Location: [facility] INVASIVE CV  LAB;  Service: Cardiovascular;  Laterality: N/A;  . ANTERIOR CERVICAL DECOMP/DISCECTOMY FUSION N/A 01/04/2020   Procedure: ANTERIOR CERVICAL DECOMPRESSION/DISCECTOMY FUSION CERVICAL FIVE THROUGH SEVEN;  Surgeon: Melina Schools, MD;  Location: [place];  Service: Orthopedics;  Laterality: N/A;  3 hrs  . AV FISTULA PLACEMENT Left 05/28/2017   Procedure: LEFT ARM ARTERIOVENOUS (AV) FISTULA CREATION;  Surgeon: Conrad Loon [place], MD;  Location: [place];  Service: Vascular;  Laterality: Left;  . EYE SURGERY Right 06/02/2019   Cataract removed  . FRACTURE SURGERY    . IR DIALY SHUNT INTRO NEEDLE/INTRACATH INITIAL W/IMG  LEFT Left 09/12/2020  . IR FLUORO GUIDE CV LINE RIGHT  05/25/2017  . IR US GUIDE VASC ACCESS LEFT  09/12/2020  . IR US GUIDE VASC ACCESS RIGHT  05/25/2017  . LIGATION OF ARTERIOVENOUS  FISTULA Left 01/10/2021   Procedure: LIGATION OF LEFT ARM RADIOCEPHALIC FISTULA;  Surgeon: Serafina Mitchell, MD;  Location: [place];  Service: Vascular;  Laterality: Left;  . REVISON OF ARTERIOVENOUS FISTULA Left 07/18/2019   Procedure: REVISION PLICATION OF RADIOCEPHALIC ARTERIOVENOUS FISTULA LEFT ARM;  Surgeon: Angelia Mould, MD;  Location: [place];  Service: Vascular;  Laterality: Left;  . REVISON OF ARTERIOVENOUS FISTULA Left 01/10/2021   Procedure: CONVERSION TO BRACHIOCEPHALIC ARTERIOVENOUS FISTULA;  Surgeon: Serafina Mitchell, MD;  Location: [place];  Service: Vascular;  Laterality: Left;  . RINOPLASTY     Family History  Family History  Problem Relation Age of Onset  . Cancer Mother    Social History  reports that he has never smoked. He has never used smokeless tobacco. He reports previous alcohol use. He reports that he does not use drugs. Allergies No Known Allergies Home medications Prior to Admission medications   Medication Sig Start Date End Date Taking? Authorizing Provider  acetaminophen (TYLENOL) 500 MG tablet Take 500 mg by mouth every 6 (six) hours as needed for moderate pain or headache.   Yes [provider]  albuterol (VENTOLIN HFA) 108 (90 Base) MCG/ACT inhaler Inhale 2 puffs into the lungs every 6 (six) hours as needed for wheezing or shortness of breath.    Yes [provider]  amLODipine (NORVASC) 10 MG tablet Take 10 mg by mouth daily.    Yes [provider]  AURYXIA 1 GM 210 MG(Fe) tablet Take 420 mg by mouth 3 (three) times daily with meals.  03/16/20  Yes [provider]  BREO ELLIPTA 100-25 MCG/INH AEPB Inhale 1 puff into the lungs daily. 06/26/20  Yes [provider]  cholecalciferol (VITAMIN D3) 25 MCG (1000 UNIT) tablet Take 1,000  Units by mouth daily.   Yes [provider]  cinacalcet (SENSIPAR) 60 MG tablet Take 60 mg by mouth daily.   Yes [provider]  DIPHENHYDRAMINE HCL PO Take 25 mg by mouth as needed (allergies). 09/24/20 09/23/21 Yes [provider]  doxercalciferol (HECTOROL) 0.5 MCG capsule Doxercalciferol (Hectorol) 11/26/20 11/25/21 Yes [provider]  furosemide (LASIX) 40 MG tablet Take 1 tablet (40 mg total) by mouth See admin instructions. On Sunday, Monday, Wednesday, Thursday and Saturday Patient taking differently: Take 40 mg by mouth daily as needed for fluid. 08/14/20  Yes Tobb, Kardie, DO  gabapentin (NEURONTIN) 300 MG capsule Take 300 mg by mouth 2 (two) times daily as needed (pain).    Yes [provider]  hydrALAZINE (APRESOLINE) 25 MG tablet Take 50 mg by mouth in the morning and at bedtime.   Yes [provider]  metoprolol  tartrate (LOPRESSOR) 100 MG tablet Take 100 mg by mouth 2 (two) times daily.    Yes [provider]  multivitamin (RENA-VIT) TABS tablet Take 1 tablet by mouth daily. 01/12/18  Yes [provider]  pregabalin (LYRICA) 25 MG capsule Take 25 mg by mouth daily as needed (pain). 11/29/20  Yes [provider]  sevelamer carbonate (RENVELA) 800 MG tablet Take 800 mg by mouth daily. 11/07/19  Yes [provider]  Specialty Vitamins Products (HEALTHY HEART COMPLEX PO) Take 1 tablet by mouth daily.   Yes [provider]  traMADol (ULTRAM) 50 MG tablet Take 1 tablet (50 mg total) by mouth every 8 (eight) hours as needed. Patient taking differently: Take 50 mg by mouth every 8 (eight) hours as needed for moderate pain. 01/10/21  Yes Rhyne, Hulen Shouts, PA-C  Methoxy PEG-Epoetin Beta (MIRCERA IJ) Mircera 10/01/20 09/30/21  [provider]     Vitals:   01/12/21 2015 01/12/21 2045 01/12/21 2115 01/12/21 2127  BP: (!) 184/87 (!) 149/77 (!) 156/76   Pulse: 73 79 68 68  Resp: 13 20    Temp:     97.8 F (36.6 C)  TempSrc:    Temporal  SpO2: 96% 94% 92% (!) 89%  Weight:      Height:       Exam Gen alert, no distress No rash, cyanosis or gangrene Sclera anicteric, throat clear  No jvd or bruits Chest occ rales bilat bases, mostly clear,  no wheezing RRR no MRG Abd soft ntnd no mass or ascites +bs GU normal MS no joint effusions or deformity Ext 2+ edema of LUE, good bruit and thrill over the L upper arm AVF Neuro is alert, Ox 3 , nf       Home meds:  - norvasc 10/ lasix 40 qd prn/ hydralazine 50 bid/ lopressor 100 bid  - renvela 800 ac / sensipar 60 qd/ auryxia 420 ac tid  - breo ellipta qd  - neurontin 300 bid/ lyrica 25 qd prn/ ultram 50 tid prn  - prn's/ vitamins/ supplements    OP HD: TTS - details pending in am     Na 137 K 5.1 CO2 25  BUN 58  Cr 8.63  Ca 9.6  Alb 3.5     Hb 9.5   Assessment/ Plan: 1. Hypoxemia/ vol overload/ pulm edema - by CXR.  Due to vol overload in ESRD pt. Will plan HD tonight w/ max UF 4 L as tolerated. Get OP HD details in am.  Will follow.  2. ESRD - on HD TTS. Get records in am.  3. Anemia ckd - Hb 9.5 here, follow 4. MBD ckd - get records in am 5. HTN - cont meds as tolerated 6. LUE edema - VVS following, did recent access surgery (ligated LFA AVF and created a ready-to-use LUA AVF) w/ hopes of improving LUE swelling. New AVF was used 1st time yesterday at OP HD successfully per the patient.       [name]  MD 01/12/2021, 9:45 PM  Recent Labs  Lab 01/12/21 1658 01/12/21 1704 01/12/21 1744  WBC 6.5  --   --   HGB 9.5* 9.5* 9.9*   Recent Labs  Lab 01/12/21 1658 01/12/21 1704 01/12/21 1744  K 5.1 4.8 4.8  BUN 58* 59*  --   CREATININE 8.63* 8.60*  --   CALCIUM 9.6  --   --

## 2021-01-12 NOTE — ED Notes (Signed)
Pt has 4+ edema of LUE, 2+ left radial pulse, cap refill less than 3 sec. Pt able to wiggle fingers. Pt has 2 in skin tear on anterior of left forearm.

## 2021-01-12 NOTE — ED Notes (Signed)
Patient transported to CT by TRN ?

## 2021-01-12 NOTE — H&P (Signed)
History and Physical    Thomas Robinson OBS:[id number] DOB: 05/24/1961 DOA: 01/12/2021  PCP: [name], PA  Patient coming from: Home  I have personally briefly reviewed patient's old medical records in [place]  Chief Complaint: Fall  HPI: Thomas Robinson is a 60 y.o. male with medical history significant of ESRD on TTS dialysis but just had dialysis Friday following fistula surgery on Thurs.  Also dCHF.  Pt also with h/o compliance issues with dialysis.  Pt with h/o chronic hypoxic resp failure, supposed to be on 2L home O2 (not really sure how much he uses it though) as he sats low 80s on RA at baseline.  Pt in to ED today after being found on ground sometime this afternoon.  Unclear how long he was down.  Doesn't remember what happened.  Having pain and swelling to L arm for months.  Dr. Trula Slade did a ligation of his left arm radiocephalic fistula and converted it to a brachiocephalic AVG.  Pt c/o pain to the left arm and there is a skin tear to his left arm.  Says pain is primarily upper arm and shoulder.  Has swelling in hand, some numbness to thumb but no pain in hand.  No CP, no fever.   ED Course: Requiring 5L O2 to maintain sats in ED.  CXR shows pulm vasc congestion.  EDP requests admit for dialysis due to fluid overload and increased O2 requirement.   Review of Systems: As per HPI, otherwise all review of systems negative.  Past Medical History:  Diagnosis Date  . Anaphylactic shock, unspecified, sequela 06/10/2019  . Arthritis   . Asthma, chronic, unspecified asthma severity, with acute exacerbation 10/23/2017  . CAP (community acquired pneumonia) 10/23/2017  . Carpal tunnel syndrome of right wrist 10/05/2018  . Cataract    right - removed by surgery  . Cerebral infarction due to thrombosis of cerebral artery ([place])   . Cervical disc herniation 01/04/2020  . Chronic diastolic heart failure ([place]) 04/13/2018  . Chronic low back pain 11/24/2019  .  Constipation   . Cough    chronic cough  . Encounter for immunization 07/08/2017  . ESRD on hemodialysis ([place]) 02/16/2018   Dialysis  T Thus Sat  - [facility]   . Fall 06/04/2019  . GERD (gastroesophageal reflux disease) 06/04/2019  . GI bleeding 05/24/2017  . Gram-negative sepsis, unspecified ([place]) 09/05/2017  . History of fusion of cervical spine 03/26/2020  . Hyperlipidemia   . Hypertension   . Hypokalemia 06/04/2017  . Iron deficiency anemia, unspecified 09/09/2017  . Left shoulder pain 11/11/2019  . Lumbar radiculopathy 11/24/2019  . Lung contusion 06/04/2019  . LVH (left ventricular hypertrophy) due to hypertensive disease, with heart failure ([place]) 06/18/2020  . Macrocytic anemia 05/24/2017  . Moderate protein-calorie malnutrition ([place]) 06/06/2017  . Myofascial pain syndrome 06/12/2020  . Neuritis of right ulnar nerve 09/13/2018  . Non-compliance with renal dialysis ([place]) 02/08/2020  . Oxygen deficiency 12/30/2019   O2 sats on RA 87% at PAT appt   . Pain in joint of right elbow 09/13/2018  . Pulmonary hypertension ([place])   . Renovascular hypertension 06/22/2020  . Restless leg syndrome   . Rib fractures 06/2019   Right  . Secondary hyperparathyroidism of renal origin ([place]) 06/04/2017  . Thoracic ascending aortic aneurysm (HCC)    4.5 cm 06/04/19 CT  . Ulnar neuropathy 10/05/2018  . Volume overload 10/23/2017    Past Surgical History:  Procedure Laterality Date  .  A/V FISTULAGRAM N/A 01/01/2021   Procedure: A/V FISTULAGRAM;  Surgeon: Serafina Mitchell, MD;  Location: [place];  Service: Cardiovascular;  Laterality: N/A;  . ANTERIOR CERVICAL DECOMP/DISCECTOMY FUSION N/A 01/04/2020   Procedure: ANTERIOR CERVICAL DECOMPRESSION/DISCECTOMY FUSION CERVICAL FIVE THROUGH SEVEN;  Surgeon: Melina Schools, MD;  Location: [facility];  Service: Orthopedics;  Laterality: N/A;  3 hrs  . AV FISTULA PLACEMENT Left 05/28/2017   Procedure: LEFT ARM ARTERIOVENOUS (AV) FISTULA CREATION;  Surgeon:  Conrad Manalapan, MD;  Location: [place];  Service: Vascular;  Laterality: Left;  . EYE SURGERY Right 06/02/2019   Cataract removed  . FRACTURE SURGERY    . IR DIALY SHUNT INTRO NEEDLE/INTRACATH INITIAL W/IMG LEFT Left 09/12/2020  . IR FLUORO GUIDE CV LINE RIGHT  05/25/2017  . IR US GUIDE VASC ACCESS LEFT  09/12/2020  . IR US GUIDE VASC ACCESS RIGHT  05/25/2017  . LIGATION OF ARTERIOVENOUS  FISTULA Left 01/10/2021   Procedure: LIGATION OF LEFT ARM RADIOCEPHALIC FISTULA;  Surgeon: Serafina Mitchell, MD;  Location: [facility];  Service: Vascular;  Laterality: Left;  . REVISON OF ARTERIOVENOUS FISTULA Left 07/18/2019   Procedure: REVISION PLICATION OF RADIOCEPHALIC ARTERIOVENOUS FISTULA LEFT ARM;  Surgeon: Angelia Mould, MD;  Location: [facility];  Service: Vascular;  Laterality: Left;  . REVISON OF ARTERIOVENOUS FISTULA Left 01/10/2021   Procedure: CONVERSION TO BRACHIOCEPHALIC ARTERIOVENOUS FISTULA;  Surgeon: Serafina Mitchell, MD;  Location: [place];  Service: Vascular;  Laterality: Left;  . RINOPLASTY       reports that he has never smoked. He has never used smokeless tobacco. He reports previous alcohol use. He reports that he does not use drugs.  No Known Allergies  Family History  Problem Relation Age of Onset  . Cancer Mother      Prior to Admission medications   Medication Sig Start Date End Date Taking? Authorizing Provider  acetaminophen (TYLENOL) 500 MG tablet Take 500 mg by mouth every 6 (six) hours as needed for moderate pain or headache.   Yes [provider]  albuterol (VENTOLIN HFA) 108 (90 Base) MCG/ACT inhaler Inhale 2 puffs into the lungs every 6 (six) hours as needed for wheezing or shortness of breath.    Yes [provider]  amLODipine (NORVASC) 10 MG tablet Take 10 mg by mouth daily.    Yes [provider]  AURYXIA 1 GM 210 MG(Fe) tablet Take 420 mg by mouth 3 (three) times daily with meals.  03/16/20  Yes [provider]  BREO ELLIPTA 100-25  MCG/INH AEPB Inhale 1 puff into the lungs daily. 06/26/20  Yes [provider]  cholecalciferol (VITAMIN D3) 25 MCG (1000 UNIT) tablet Take 1,000 Units by mouth daily.   Yes [provider]  cinacalcet (SENSIPAR) 60 MG tablet Take 60 mg by mouth daily.   Yes [provider]  DIPHENHYDRAMINE HCL PO Take 25 mg by mouth as needed (allergies). 09/24/20 09/23/21 Yes [provider]  doxercalciferol (HECTOROL) 0.5 MCG capsule Doxercalciferol (Hectorol) 11/26/20 11/25/21 Yes [provider]  furosemide (LASIX) 40 MG tablet Take 1 tablet (40 mg total) by mouth See admin instructions. On Sunday, Monday, Wednesday, Thursday and Saturday Patient taking differently: Take 40 mg by mouth daily as needed for fluid. 08/14/20  Yes Tobb, Kardie, DO  gabapentin (NEURONTIN) 300 MG capsule Take 300 mg by mouth 2 (two) times daily as needed (pain).    Yes [provider]  hydrALAZINE (APRESOLINE) 25 MG tablet Take 50  mg by mouth in the morning and at bedtime.   Yes [provider]  metoprolol tartrate (LOPRESSOR) 100 MG tablet Take 100 mg by mouth 2 (two) times daily.    Yes [provider]  multivitamin (RENA-VIT) TABS tablet Take 1 tablet by mouth daily. 01/12/18  Yes [provider]  pregabalin (LYRICA) 25 MG capsule Take 25 mg by mouth daily as needed (pain). 11/29/20  Yes [provider]  sevelamer carbonate (RENVELA) 800 MG tablet Take 800 mg by mouth daily. 11/07/19  Yes [provider]  Specialty Vitamins Products (HEALTHY HEART COMPLEX PO) Take 1 tablet by mouth daily.   Yes [provider]  traMADol (ULTRAM) 50 MG tablet Take 1 tablet (50 mg total) by mouth every 8 (eight) hours as needed. Patient taking differently: Take 50 mg by mouth every 8 (eight) hours as needed for moderate pain. 01/10/21  Yes Rhyne, Hulen Shouts, PA-C  Methoxy PEG-Epoetin Beta (MIRCERA IJ) Mircera 10/01/20 09/30/21  [provider]     Physical Exam: Vitals:   01/12/21 2015 01/12/21 2045 01/12/21 2115 01/12/21 2127  BP: (!) 184/87 (!) 149/77 (!) 156/76   Pulse: 73 79 68 68  Resp: 13 20    Temp:    97.8 F (36.6 C)  TempSrc:    Temporal  SpO2: 96% 94% 92% (!) 89%  Weight:      Height:        Constitutional: NAD, calm, comfortable Eyes: PERRL, lids and conjunctivae normal ENMT: Mucous membranes are moist. Posterior pharynx clear of any exudate or lesions.Normal dentition.  Neck: normal, supple, no masses, no thyromegaly Respiratory: clear to auscultation bilaterally, no wheezing, no crackles. Normal respiratory effort. No accessory muscle use.  Cardiovascular: Regular rate and rhythm, no murmurs / rubs / gallops. No extremity edema. 2+ pedal pulses. No carotid bruits.  Abdomen: no tenderness, no masses palpated. No hepatosplenomegaly. Bowel sounds positive.  Musculoskeletal: L arm edematous, fistula with palpable thrill. Skin: no rashes, lesions, ulcers. No induration Neurologic: CN 2-12 grossly intact. Sensation intact, DTR normal. Strength 5/5 in all 4.  Psychiatric: Normal judgment and insight. Alert and oriented x 3. Normal mood.    Labs on Admission: I have personally reviewed following labs and imaging studies  CBC: Recent Labs  Lab 01/10/21 0657 01/12/21 1658 01/12/21 1704 01/12/21 1744  WBC  --  6.5  --   --   NEUTROABS  --  5.4  --   --   HGB 8.5* 9.5* 9.5* 9.9*  HCT 25.0* 29.9* 28.0* 29.0*  MCV  --  111.6*  --   --   PLT  --  183  --   --    Basic Metabolic Panel: Recent Labs  Lab 01/10/21 0657 01/12/21 1658 01/12/21 1704 01/12/21 1744  NA 138 137 134* 135  K 4.7 5.1 4.8 4.8  CL 105 98 98  --   CO2  --  25  --   --   GLUCOSE 84 88 85  --   BUN 61* 58* 59*  --   CREATININE 9.30* 8.63* 8.60*  --   CALCIUM  --  9.6  --   --    GFR: Estimated Creatinine Clearance: 7.5 mL/min (A) (by C-G formula based on SCr of 8.6 mg/dL (H)). Liver Function Tests: Recent Labs  Lab  01/12/21 1658  AST 19  ALT <5  ALKPHOS 71  BILITOT 1.4*  PROT 6.4*  ALBUMIN 3.5   No results for input(s): LIPASE, AMYLASE  in the last 168 hours. No results for input(s): AMMONIA in the last 168 hours. Coagulation Profile: Recent Labs  Lab 01/12/21 1658  INR 1.2   Cardiac Enzymes: Recent Labs  Lab 01/12/21 1658  CKTOTAL 42*   BNP (last 3 results) No results for input(s): PROBNP in the last 8760 hours. HbA1C: No results for input(s): HGBA1C in the last 72 hours. CBG: Recent Labs  Lab 01/10/21 1039 01/12/21 1847  GLUCAP 211* 88   Lipid Profile: No results for input(s): CHOL, HDL, LDLCALC, TRIG, CHOLHDL, LDLDIRECT in the last 72 hours. Thyroid Function Tests: No results for input(s): TSH, T4TOTAL, FREET4, T3FREE, THYROIDAB in the last 72 hours. Anemia Panel: No results for input(s): VITAMINB12, FOLATE, FERRITIN, TIBC, IRON, RETICCTPCT in the last 72 hours. Urine analysis:    Component Value Date/Time   COLORURINE YELLOW 05/24/2017 1729   APPEARANCEUR HAZY (A) 05/24/2017 1729   LABSPEC 1.011 05/24/2017 1729   PHURINE 5.0 05/24/2017 1729   GLUCOSEU 50 (A) 05/24/2017 1729   HGBUR SMALL (A) 05/24/2017 1729   BILIRUBINUR NEGATIVE 05/24/2017 1729   KETONESUR NEGATIVE 05/24/2017 1729   PROTEINUR 100 (A) 05/24/2017 1729   UROBILINOGEN 0.2 04/09/2015 1527   NITRITE NEGATIVE 05/24/2017 1729   LEUKOCYTESUR TRACE (A) 05/24/2017 1729    Radiological Exams on Admission: DG Chest 1 View  Result Date: 01/12/2021 CLINICAL DATA:  Pain following fall EXAM: CHEST  1 VIEW COMPARISON:  September 19, 2020 FINDINGS: There is cardiomegaly with pulmonary venous hypertension. The interstitium is mildly prominent without overt pulmonary edema. No appreciable airspace opacity. No adenopathy. No pneumothorax. There is a skin fold on the left. No fracture evident. Postoperative change in lower cervical spine. There is aortic atherosclerosis. IMPRESSION: Suspect underlying chronic bronchitis.  No edema or consolidation. There is cardiomegaly with pulmonary vascular congestion. No pneumothorax. Aortic Atherosclerosis (ICD10-I70.0). Electronically Signed   By: Lowella Grip III M.D.   On: 01/12/2021 18:02   DG Pelvis 1-2 Views  Result Date: 01/12/2021 CLINICAL DATA:  Pain following fall EXAM: PELVIS - 1-2 VIEW COMPARISON:  None. FINDINGS: There is no evidence of pelvic fracture or dislocation. Joint spaces appear normal. Multiple foci of vascular calcification noted bilaterally. IMPRESSION: No fracture or dislocation. No evident arthropathy. Multiple foci of vascular calcification noted. Electronically Signed   By: Lowella Grip III M.D.   On: 01/12/2021 18:07   DG Elbow Complete Left  Result Date: 01/12/2021 CLINICAL DATA:  Pain following fall. Reported recent arm region surgery EXAM: LEFT ELBOW - COMPLETE 3+ VIEW COMPARISON:  None. FINDINGS: Frontal, lateral, and bilateral oblique views were obtained. There is soft tissue swelling. There is no appreciable fracture or dislocation. No joint effusion. No joint space narrowing or erosion. Multiple foci of arterial vascular calcification noted. Note that there is soft tissue air in the volar aspect of the distal humerus. IMPRESSION: No fracture or dislocation. Soft tissue swelling and soft tissue air present. A degree of infection within the soft tissues of the distal upper arm region is questioned. Nearby surgical clips. Multiple foci of arterial vascular calcification noted. Electronically Signed   By: Lowella Grip III M.D.   On: 01/12/2021 18:05   DG Forearm Left  Result Date: 01/12/2021 CLINICAL DATA:  Pain following fall.  Recent fistula surgery EXAM: LEFT FOREARM - 2 VIEW COMPARISON:  None. FINDINGS: Frontal and lateral views were obtained. No acute fracture or dislocation. No appreciable joint space narrowing or erosion. Multiple foci of vascular calcification noted. Soft tissue air is noted  volar to the distal upper extremity.  IMPRESSION: Foci of soft tissue air may be have infectious etiology or may be secondary to recent surgery. Clinical assessment in this regard warranted. No acute fracture or dislocation. No appreciable joint space narrowing or erosion. Multiple foci of arterial vascular calcification noted. Electronically Signed   By: Lowella Grip III M.D.   On: 01/12/2021 18:06   CT Head Wo Contrast  Result Date: 01/12/2021 CLINICAL DATA:  Fall EXAM: CT HEAD WITHOUT CONTRAST CT CERVICAL SPINE WITHOUT CONTRAST TECHNIQUE: Multidetector CT imaging of the head and cervical spine was performed following the standard protocol without intravenous contrast. Multiplanar CT image reconstructions of the cervical spine were also generated. COMPARISON:  None. FINDINGS: CT HEAD FINDINGS Brain: No evidence of acute infarction, hemorrhage, hydrocephalus, extra-axial collection or mass lesion/mass effect. Periventricular white matter hypodensity. Vascular: No hyperdense vessel or unexpected calcification. Skull: Normal. Negative for fracture or focal lesion. Sinuses/Orbits: No acute finding. Other: None. CT CERVICAL SPINE FINDINGS Alignment: Degenerative straightening and reversal of the normal cervical lordosis. Skull base and vertebrae: No acute fracture. No primary bone lesion or focal pathologic process. Soft tissues and spinal canal: No prevertebral fluid or swelling. No visible canal hematoma. Disc levels: Status post anterior cervical discectomy and fusion of C5 through C7. Upper chest: Negative. Other: None. IMPRESSION: 1. No acute intracranial pathology. Small-vessel white matter disease. 2. No fracture or static subluxation of the cervical spine. 3. Status post anterior cervical discectomy and fusion of C5 through C7. Electronically Signed   By: Eddie Candle M.D.   On: 01/12/2021 17:50   CT Cervical Spine Wo Contrast  Result Date: 01/12/2021 CLINICAL DATA:  Fall EXAM: CT HEAD WITHOUT CONTRAST CT CERVICAL SPINE WITHOUT  CONTRAST TECHNIQUE: Multidetector CT imaging of the head and cervical spine was performed following the standard protocol without intravenous contrast. Multiplanar CT image reconstructions of the cervical spine were also generated. COMPARISON:  None. FINDINGS: CT HEAD FINDINGS Brain: No evidence of acute infarction, hemorrhage, hydrocephalus, extra-axial collection or mass lesion/mass effect. Periventricular white matter hypodensity. Vascular: No hyperdense vessel or unexpected calcification. Skull: Normal. Negative for fracture or focal lesion. Sinuses/Orbits: No acute finding. Other: None. CT CERVICAL SPINE FINDINGS Alignment: Degenerative straightening and reversal of the normal cervical lordosis. Skull base and vertebrae: No acute fracture. No primary bone lesion or focal pathologic process. Soft tissues and spinal canal: No prevertebral fluid or swelling. No visible canal hematoma. Disc levels: Status post anterior cervical discectomy and fusion of C5 through C7. Upper chest: Negative. Other: None. IMPRESSION: 1. No acute intracranial pathology. Small-vessel white matter disease. 2. No fracture or static subluxation of the cervical spine. 3. Status post anterior cervical discectomy and fusion of C5 through C7. Electronically Signed   By: Eddie Candle M.D.   On: 01/12/2021 17:50    EKG: Independently reviewed.  Assessment/Plan Principal Problem:   Acute on chronic respiratory failure with hypoxia (HCC) Active Problems:   Essential hypertension   ESRD on dialysis ([place])   Acute on chronic diastolic CHF (congestive heart failure) ([place])    1. Acute on chronic resp with hypoxia - 1. Due to fluid overload / acute on chronic diastolic CHF 2. O2 via [place] 3. Cont pulse ox 4. Dialysis in AM 2. ESRD - 1. EDP spoke with Dr. Jonnie Finner, AM dialysis 3. HTN - 1. Cont home BP meds 4. Arm swelling - 1. Will get venous US of upper extremity  DVT prophylaxis: Heparin [place] Code Status: Full Family  Communication:  No family in room Disposition Plan: Home after O2 requirement improved Consults called: None Admission status: Place in obs     Sanjana Folz, [facility]  How to contact the [facility] Attending or Consulting provider [place] or covering provider during after hours [place], for this patient?  1. Check the care team in [facility] and look for a) attending/consulting TR[facility] provider listed and b) the [facility] team listed 2. Log into [email or web address]  Amion Physician Scheduling and messaging for groups and whole hospitals  On call and physician scheduling software for group practices, residents, hospitalists and other medical providers for call, clinic, rotation and shift schedules. OnCall [employer] is a hospital-wide system for scheduling doctors and paging doctors on call. EasyPlot is for scientific plotting and data analysis.  [email or web address]  and use [facility]'s universal password to access. If you do not have the password, please contact the hospital operator.  3. Locate the [facility] provider you are looking for under [facility] and page to a number that you can be directly reached. 4. If you still have difficulty reaching the provider, please page the [facility] (Director on Call) for the Hospitalists listed on amion for assistance.  01/12/2021, 9:47 PM

## 2021-01-12 NOTE — ED Notes (Signed)
I cleaned the skin tear on pt's left arm and applied a dry dressing.

## 2021-01-12 NOTE — Progress Notes (Signed)
Report received. His room is ready.  °

## 2021-01-12 NOTE — Progress Notes (Signed)
Orthopedic Tech Progress Note Patient Details:  Thomas Robinson Dec 25, 1960 [id number] Level 2 Trauma. Not currently needed Patient ID: Thomas Robinson, male   DOB: 11-Aug-1961, 60 y.o.   MRN: [id number]   Thomas Robinson 01/12/2021, 5:05 PM

## 2021-01-13 ENCOUNTER — Other Ambulatory Visit: Payer: Self-pay

## 2021-01-13 ENCOUNTER — Inpatient Hospital Stay (HOSPITAL_COMMUNITY): Payer: Medicare Other

## 2021-01-13 ENCOUNTER — Encounter (HOSPITAL_COMMUNITY): Payer: Self-pay | Admitting: Internal Medicine

## 2021-01-13 ENCOUNTER — Observation Stay (HOSPITAL_BASED_OUTPATIENT_CLINIC_OR_DEPARTMENT_OTHER): Payer: Medicare Other

## 2021-01-13 DIAGNOSIS — Z981 Arthrodesis status: Secondary | ICD-10-CM | POA: Diagnosis not present

## 2021-01-13 DIAGNOSIS — M7989 Other specified soft tissue disorders: Secondary | ICD-10-CM

## 2021-01-13 DIAGNOSIS — I340 Nonrheumatic mitral (valve) insufficiency: Secondary | ICD-10-CM | POA: Diagnosis not present

## 2021-01-13 DIAGNOSIS — I10 Essential (primary) hypertension: Secondary | ICD-10-CM | POA: Diagnosis not present

## 2021-01-13 DIAGNOSIS — M79602 Pain in left arm: Secondary | ICD-10-CM

## 2021-01-13 DIAGNOSIS — J9620 Acute and chronic respiratory failure, unspecified whether with hypoxia or hypercapnia: Secondary | ICD-10-CM | POA: Diagnosis present

## 2021-01-13 DIAGNOSIS — Z992 Dependence on renal dialysis: Secondary | ICD-10-CM | POA: Diagnosis not present

## 2021-01-13 DIAGNOSIS — R55 Syncope and collapse: Secondary | ICD-10-CM | POA: Diagnosis not present

## 2021-01-13 DIAGNOSIS — I350 Nonrheumatic aortic (valve) stenosis: Secondary | ICD-10-CM

## 2021-01-13 DIAGNOSIS — R52 Pain, unspecified: Secondary | ICD-10-CM | POA: Diagnosis present

## 2021-01-13 DIAGNOSIS — D631 Anemia in chronic kidney disease: Secondary | ICD-10-CM | POA: Diagnosis present

## 2021-01-13 DIAGNOSIS — Z8673 Personal history of transient ischemic attack (TIA), and cerebral infarction without residual deficits: Secondary | ICD-10-CM | POA: Diagnosis not present

## 2021-01-13 DIAGNOSIS — I351 Nonrheumatic aortic (valve) insufficiency: Secondary | ICD-10-CM | POA: Diagnosis not present

## 2021-01-13 DIAGNOSIS — Z9981 Dependence on supplemental oxygen: Secondary | ICD-10-CM | POA: Diagnosis not present

## 2021-01-13 DIAGNOSIS — I361 Nonrheumatic tricuspid (valve) insufficiency: Secondary | ICD-10-CM

## 2021-01-13 DIAGNOSIS — I5033 Acute on chronic diastolic (congestive) heart failure: Secondary | ICD-10-CM | POA: Diagnosis present

## 2021-01-13 DIAGNOSIS — Y92009 Unspecified place in unspecified non-institutional (private) residence as the place of occurrence of the external cause: Secondary | ICD-10-CM | POA: Diagnosis not present

## 2021-01-13 DIAGNOSIS — Z9115 Patient's noncompliance with renal dialysis: Secondary | ICD-10-CM | POA: Diagnosis not present

## 2021-01-13 DIAGNOSIS — I132 Hypertensive heart and chronic kidney disease with heart failure and with stage 5 chronic kidney disease, or end stage renal disease: Secondary | ICD-10-CM | POA: Diagnosis present

## 2021-01-13 DIAGNOSIS — R778 Other specified abnormalities of plasma proteins: Secondary | ICD-10-CM | POA: Diagnosis not present

## 2021-01-13 DIAGNOSIS — N186 End stage renal disease: Secondary | ICD-10-CM | POA: Diagnosis present

## 2021-01-13 DIAGNOSIS — S51812A Laceration without foreign body of left forearm, initial encounter: Secondary | ICD-10-CM | POA: Diagnosis present

## 2021-01-13 DIAGNOSIS — R0789 Other chest pain: Secondary | ICD-10-CM | POA: Diagnosis not present

## 2021-01-13 DIAGNOSIS — T82858A Stenosis of vascular prosthetic devices, implants and grafts, initial encounter: Secondary | ICD-10-CM | POA: Diagnosis not present

## 2021-01-13 DIAGNOSIS — J9621 Acute and chronic respiratory failure with hypoxia: Secondary | ICD-10-CM | POA: Diagnosis present

## 2021-01-13 DIAGNOSIS — I871 Compression of vein: Secondary | ICD-10-CM | POA: Diagnosis present

## 2021-01-13 DIAGNOSIS — Z20822 Contact with and (suspected) exposure to covid-19: Secondary | ICD-10-CM | POA: Diagnosis present

## 2021-01-13 DIAGNOSIS — R001 Bradycardia, unspecified: Secondary | ICD-10-CM | POA: Diagnosis present

## 2021-01-13 DIAGNOSIS — I248 Other forms of acute ischemic heart disease: Secondary | ICD-10-CM | POA: Diagnosis present

## 2021-01-13 DIAGNOSIS — W19XXXA Unspecified fall, initial encounter: Secondary | ICD-10-CM | POA: Diagnosis present

## 2021-01-13 LAB — CBC
HCT: 30.4 % — ABNORMAL LOW (ref 39.0–52.0)
Hemoglobin: 9.7 g/dL — ABNORMAL LOW (ref 13.0–17.0)
MCH: 34.5 pg — ABNORMAL HIGH (ref 26.0–34.0)
MCHC: 31.9 g/dL (ref 30.0–36.0)
MCV: 108.2 fL — ABNORMAL HIGH (ref 80.0–100.0)
Platelets: 165 10*3/uL (ref 150–400)
RBC: 2.81 MIL/uL — ABNORMAL LOW (ref 4.22–5.81)
RDW: 15.6 % — ABNORMAL HIGH (ref 11.5–15.5)
WBC: 4.6 10*3/uL (ref 4.0–10.5)
nRBC: 0.7 % — ABNORMAL HIGH (ref 0.0–0.2)

## 2021-01-13 LAB — ECHOCARDIOGRAM COMPLETE
AR max vel: 1.26 cm2
AV Area VTI: 1.46 cm2
AV Area mean vel: 1.31 cm2
AV Mean grad: 12 mmHg
AV Peak grad: 23.2 mmHg
Ao pk vel: 2.41 m/s
Area-P 1/2: 1.66 cm2
Calc EF: 47.5 %
Height: 68 in
P 1/2 time: 325 msec
S' Lateral: 2.4 cm
Single Plane A2C EF: 42.2 %
Single Plane A4C EF: 50.9 %
Weight: 2137.58 oz

## 2021-01-13 LAB — BASIC METABOLIC PANEL
Anion gap: 10 (ref 5–15)
BUN: 14 mg/dL (ref 8–23)
CO2: 32 mmol/L (ref 22–32)
Calcium: 9.6 mg/dL (ref 8.9–10.3)
Chloride: 92 mmol/L — ABNORMAL LOW (ref 98–111)
Creatinine, Ser: 3.45 mg/dL — ABNORMAL HIGH (ref 0.61–1.24)
GFR, Estimated: 19 mL/min — ABNORMAL LOW (ref >60)
Glucose, Bld: 88 mg/dL (ref 70–99)
Potassium: 3.4 mmol/L — ABNORMAL LOW (ref 3.5–5.1)
Sodium: 134 mmol/L — ABNORMAL LOW (ref 135–145)

## 2021-01-13 LAB — MRSA PCR SCREENING: MRSA by PCR: NEGATIVE

## 2021-01-13 MED ORDER — ORAL CARE MOUTH RINSE
15.0000 mL | Freq: Two times a day (BID) | OROMUCOSAL | Status: DC
  Administered 2021-01-13 – 2021-01-14 (×4): 15 mL via OROMUCOSAL

## 2021-01-13 NOTE — Progress Notes (Signed)
LUE venous duplex has been completed.  Preliminary findings given to Dr. Horris Latino at 1112.  Suggested vascular consult.  Results can be found under chart review under CV PROC. 01/13/2021 11:14 AM Antwane Grose RVT, RDMS

## 2021-01-13 NOTE — Progress Notes (Addendum)
[facility] Progress Note  Subjective: seen in room, 4 L off last night. Counseled him to minimize po ice/ fluid intake.   Vitals:   01/13/21 0513 01/13/21 0904 01/13/21 1847 01/13/21 1959  BP: 129/69 118/73 111/61 121/63  Pulse: 61 78 77 75  Resp: 18 16 18 19   Temp: 97.9 F (36.6 C) 99.6 F (37.6 C) 98.5 F (36.9 C) 98.6 F (37 C)  TempSrc: Oral Oral Oral Oral  SpO2: 96% 97% 92% 95%  Weight:      Height:        Exam: Gen alert, no distress No jvd or bruits Chest mostly clear RRR no MRG Abd soft ntnd no mass or ascites +bs bilat 1+ pretib edema Ext 2+ edema of LUE,  L upper arm AVF+bruit Neuro is alert, Ox 3 , nf       Home meds:  - norvasc 10/ lasix 40 qd prn/ hydralazine 50 bid/ lopressor 100 bid  - renvela 800 ac / sensipar 60 qd/ auryxia 420 ac tid  - breo ellipta qd  - neurontin 300 bid/ lyrica 25 qd prn/ ultram 50 tid prn  - prn's/ vitamins/ supplements    OP HD: TTS - details pending   Assessment/ Plan: 1. Hypoxemia/ vol overload/ pulm edema - by CXR.  Due to vol overload in ESRD pt. 4 L off w/ HD last night. Get f/u wt's and CXR in am. Wean O2 to RA.  2. ESRD - on HD TTS. Next HD on Tuesday 5/17, sooner if needed.  3. Anemia ckd - Hb 9.5 here, follow 4. MBD ckd - get records 5. HTN - cont meds as tolerated 6. LUE edema - VVS following, did recent access surgery (ligated LFA AVF and created a ready-to-use LUA AVF) w/ hopes of improving LUE swelling.         Rob Aurther Harlin 01/13/2021, 10:15 PM   Recent Labs  Lab 01/12/21 1658 01/12/21 1704 01/12/21 1744 01/13/21 0459  K 5.1 4.8 4.8 3.4*  BUN 58* 59*  --  14  CREATININE 8.63* 8.60*  --  3.45*  CALCIUM 9.6  --   --  9.6  HGB 9.5* 9.5* 9.9* 9.7*   Inpatient medications: . amLODipine  10 mg Oral Daily  . Chlorhexidine Gluconate Cloth  6 each Topical Q0600  . cholecalciferol  1,000 Units Oral Daily  . cinacalcet  60 mg Oral Q breakfast  . ferric citrate  420 mg  Oral TID WC  . fluticasone furoate-vilanterol  1 puff Inhalation Daily  . heparin  5,000 Units Subcutaneous Q8H  . hydrALAZINE  50 mg Oral BID  . mouth rinse  15 mL Mouth Rinse BID  . metoprolol tartrate  100 mg Oral BID  . multivitamin  1 tablet Oral Daily  . sevelamer carbonate  800 mg Oral Q breakfast    acetaminophen **OR** acetaminophen, albuterol, gabapentin, HYDROcodone-acetaminophen, ondansetron **OR** ondansetron (ZOFRAN) IV, pregabalin, traMADol

## 2021-01-13 NOTE — Progress Notes (Signed)
PROGRESS NOTE  Thomas Robinson OJJ:[id number] DOB: 08-13-1961 DOA: 01/12/2021 PCP: [name], PA  HPI/Recap of past 24 hours: HPI from Dr Danelle Earthly Bryant Saye is a 60 y.o. male with medical history significant of ESRD on MWF, HTN, dCHF who lives with a friend and was found down on 01/12/21. Pt unclear how long he was down. Doesn't remember what happened. Pt also with h/o compliance issues with dialysis. Pt with h/o chronic hypoxic resp failure, supposed to be on 2L home O2 (not really sure how much he uses it though) as he sats low 80s on RA at baseline. Pt recently had a ligation of his left arm radiocephalic fistula, that was converted to a brachiocephalic AVG by Dr Trula Slade on 01/10/21. Reports pain and swelling to LUE for months, with notable L hand swelling. Also c/o L shoulder pain. In the ED, noted to be requiring about 5L O2. CXR shows pulm vascular congestion. Nephrology consulted. Pt admitted for further management.    Today, pt still reporting pain, swelling in left hand, which feels cool to touch. Continues to c/o L shoulder pain.  Patient currently denies any chest pain, shortness of breath, abdominal pain, nausea/vomiting, fever/chills.    Assessment/Plan: Principal Problem:   Acute on chronic respiratory failure with hypoxia (HCC) Active Problems:   Essential hypertension   ESRD on dialysis [facility])   Acute on chronic diastolic CHF (congestive heart failure) (HCC)   Volume overload/pulmonary edema ESRD on HD M/W/F Chest x-ray with volume overload Nephrology consulted, HD on 01/12/2021 Daily renal panel  Possible syncope Found down at home Unknown etiology CT head with no acute intracranial pathology CT cervical spine with no fracture EKG with no acute ST changes, troponin pending ECHO pending Telemetry PT/OT  LUE edema Notable swelling in left hand, cool to touch LUE x-ray with no fracture or dislocation, noted some soft tissue swelling with air  present likely from recent surgery versus infection LUE Doppler negative for DVT, but shows arterial flow concern Vascular surgery consulted for further management, recently revised from radiocephalic to brachiocephalic fistula by Dr. Trula Slade on 01/11/2019 Elevated extremity, pain management  Acute on chronic hypoxic respiratory failure Likely 2/2 volume overload Management as above  Chronic diastolic HF Fluid management via dialysis  Hypertension BP now stable Continue amlodipine, hydralazine, metoprolol  Anemia of chronic kidney disease Hemoglobin around baseline Daily CBC   Estimated body mass index is 20.31 kg/m as calculated from the following:   Height as of this encounter: 5\' 8"  (1.727 m).   Weight as of this encounter: 60.6 kg.     Code Status: Full  Family Communication: None at bedside  Disposition Plan: Status is: Observation  The patient will require care spanning > 2 midnights and should be moved to inpatient because: Inpatient level of care appropriate due to severity of illness  Dispo: The patient is from: Home              Anticipated d/c is to: Home              Patient currently is not medically stable to d/c.   Difficult to place patient No    Consultants:  Nephrology  Vascular surgery  Procedures:  None  Antimicrobials:  None  DVT prophylaxis: Heparin   Objective: Vitals:   01/13/21 0313 01/13/21 0343 01/13/21 0513 01/13/21 0904  BP: 124/60 136/86 129/69 118/73  Pulse: (!) 58 86 61 78  Resp: 15 18 18 16   Temp:  98.4  F (36.9 C) 97.9 F (36.6 C) 99.6 F (37.6 C)  TempSrc:  Oral Oral Oral  SpO2: 100% (!) 86% 96% 97%  Weight:      Height:        Intake/Output Summary (Last 24 hours) at 01/13/2021 1052 Last data filed at 01/13/2021 0900 Gross per 24 hour  Intake 360 ml  Output 4000 ml  Net -3640 ml   Filed Weights   01/12/21 1702 01/12/21 2127  Weight: 59 kg 60.6 kg    Exam:  General: NAD   Cardiovascular: S1, S2  present  Respiratory: CTAB  Abdomen: Soft, nontender, nondistended, bowel sounds present  Musculoskeletal: LUE swelling, especially around hand, feels cool to touch, no bilateral pedal edema noted  Skin: Normal  Psychiatry: Normal mood   Data Reviewed: CBC: Recent Labs  Lab 01/10/21 0657 01/12/21 1658 01/12/21 1704 01/12/21 1744 01/13/21 0459  WBC  --  6.5  --   --  4.6  NEUTROABS  --  5.4  --   --   --   HGB 8.5* 9.5* 9.5* 9.9* 9.7*  HCT 25.0* 29.9* 28.0* 29.0* 30.4*  MCV  --  111.6*  --   --  108.2*  PLT  --  183  --   --  591   Basic Metabolic Panel: Recent Labs  Lab 01/10/21 0657 01/12/21 1658 01/12/21 1704 01/12/21 1744 01/13/21 0459  NA 138 137 134* 135 134*  K 4.7 5.1 4.8 4.8 3.4*  CL 105 98 98  --  92*  CO2  --  25  --   --  32  GLUCOSE 84 88 85  --  88  BUN 61* 58* 59*  --  14  CREATININE 9.30* 8.63* 8.60*  --  3.45*  CALCIUM  --  9.6  --   --  9.6   GFR: Estimated Creatinine Clearance: 19.3 mL/min (A) (by C-G formula based on SCr of 3.45 mg/dL (H)). Liver Function Tests: Recent Labs  Lab 01/12/21 1658  AST 19  ALT <5  ALKPHOS 71  BILITOT 1.4*  PROT 6.4*  ALBUMIN 3.5   No results for input(s): LIPASE, AMYLASE in the last 168 hours. No results for input(s): AMMONIA in the last 168 hours. Coagulation Profile: Recent Labs  Lab 01/12/21 1658  INR 1.2   Cardiac Enzymes: Recent Labs  Lab 01/12/21 1658  CKTOTAL 42*   BNP (last 3 results) No results for input(s): PROBNP in the last 8760 hours. HbA1C: No results for input(s): HGBA1C in the last 72 hours. CBG: Recent Labs  Lab 01/10/21 1039 01/12/21 1847  GLUCAP 211* 88   Lipid Profile: No results for input(s): CHOL, HDL, LDLCALC, TRIG, CHOLHDL, LDLDIRECT in the last 72 hours. Thyroid Function Tests: No results for input(s): TSH, T4TOTAL, FREET4, T3FREE, THYROIDAB in the last 72 hours. Anemia Panel: No results for input(s): VITAMINB12, FOLATE, FERRITIN, TIBC, IRON, RETICCTPCT in  the last 72 hours. Urine analysis:    Component Value Date/Time   COLORURINE YELLOW 05/24/2017 1729   APPEARANCEUR HAZY (A) 05/24/2017 1729   LABSPEC 1.011 05/24/2017 1729   PHURINE 5.0 05/24/2017 1729   GLUCOSEU 50 (A) 05/24/2017 1729   HGBUR SMALL (A) 05/24/2017 1729   BILIRUBINUR NEGATIVE 05/24/2017 1729   KETONESUR NEGATIVE 05/24/2017 1729   PROTEINUR 100 (A) 05/24/2017 1729   UROBILINOGEN 0.2 04/09/2015 1527   NITRITE NEGATIVE 05/24/2017 1729   LEUKOCYTESUR TRACE (A) 05/24/2017 1729   Sepsis Labs: @LABRCNTIP (procalcitonin:4,lacticidven:4)  ) Recent Results (from the past 240  hour(s))  SARS CORONAVIRUS 2 (TAT 6-24 HRS) Nasopharyngeal Nasopharyngeal Swab     Status: None   Collection Time: 01/08/21  1:32 PM   Specimen: Nasopharyngeal Swab  Result Value Ref Range Status   SARS Coronavirus 2 NEGATIVE NEGATIVE Final    Comment: (NOTE) SARS-CoV-2 target nucleic acids are NOT DETECTED.  The SARS-CoV-2 RNA is generally detectable in upper and lower respiratory specimens during the acute phase of infection. Negative results do not preclude SARS-CoV-2 infection, do not rule out co-infections with other pathogens, and should not be used as the sole basis for treatment or other patient management decisions. Negative results must be combined with clinical observations, patient history, and epidemiological information. The expected result is Negative.  Fact Sheet for Patients: SugarRoll.be  Fact Sheet for Healthcare Providers: https://www.woods-mathews.com/  This test is not yet approved or cleared by the [place] FDA and  has been authorized for detection and/or diagnosis of SARS-CoV-2 by FDA under an Emergency Use Authorization (EUA). This EUA will remain  in effect (meaning this test can be used) for the duration of the COVID-19 declaration under Se ction 564(b)(1) of the Act, 21 U.S.C. section 360bbb-3(b)(1), unless the  authorization is terminated or revoked sooner.  Performed at [facility], [place] [address], [place], [place] [address]   Resp Panel by RT-PCR (Flu A&B, Covid) Nasopharyngeal Swab     Status: None   Collection Time: 01/12/21  5:49 PM   Specimen: Nasopharyngeal Swab; Nasopharyngeal(NP) swabs in vial transport medium  Result Value Ref Range Status   SARS Coronavirus 2 by RT PCR NEGATIVE NEGATIVE Final    Comment: (NOTE) SARS-CoV-2 target nucleic acids are NOT DETECTED.  The SARS-CoV-2 RNA is generally detectable in upper respiratory specimens during the acute phase of infection. The lowest concentration of SARS-CoV-2 viral copies this assay can detect is 138 copies/mL. A negative result does not preclude SARS-Cov-2 infection and should not be used as the sole basis for treatment or other patient management decisions. A negative result may occur with  improper specimen collection/handling, submission of specimen other than nasopharyngeal swab, presence of viral mutation(s) within the areas targeted by this assay, and inadequate number of viral copies(<138 copies/mL). A negative result must be combined with clinical observations, patient history, and epidemiological information. The expected result is Negative.  Fact Sheet for Patients:  EntrepreneurPulse.com.au  Fact Sheet for Healthcare Providers:  IncredibleEmployment.be  This test is no t yet approved or cleared by the [place] FDA and  has been authorized for detection and/or diagnosis of SARS-CoV-2 by FDA under an Emergency Use Authorization (EUA). This EUA will remain  in effect (meaning this test can be used) for the duration of the COVID-19 declaration under Section 564(b)(1) of the Act, 21 U.S.C.section 360bbb-3(b)(1), unless the authorization is terminated  or revoked sooner.       Influenza A by PCR NEGATIVE NEGATIVE Final   Influenza B by PCR NEGATIVE NEGATIVE Final     Comment: (NOTE) The Xpert Xpress SARS-CoV-2/FLU/RSV plus assay is intended as an aid in the diagnosis of influenza from Nasopharyngeal swab specimens and should not be used as a sole basis for treatment. Nasal washings and aspirates are unacceptable for Xpert Xpress SARS-CoV-2/FLU/RSV testing.  Fact Sheet for Patients: EntrepreneurPulse.com.au  Fact Sheet for Healthcare Providers: IncredibleEmployment.be  This test is not yet approved or cleared by the [place] FDA and has been authorized for detection and/or diagnosis of SARS-CoV-2 by FDA under an Emergency Use Authorization (EUA).  This EUA will remain in effect (meaning this test can be used) for the duration of the COVID-19 declaration under Section 564(b)(1) of the Act, 21 U.S.C. section 360bbb-3(b)(1), unless the authorization is terminated or revoked.  Performed at [facility], [place] [address], [place], [place] [address]   MRSA PCR Screening     Status: None   Collection Time: 01/13/21  7:10 AM   Specimen: Nasal Mucosa; Nasopharyngeal  Result Value Ref Range Status   MRSA by PCR NEGATIVE NEGATIVE Final    Comment:        The GeneXpert MRSA Assay (FDA approved for NASAL specimens only), is one component of a comprehensive MRSA colonization surveillance program. It is not intended to diagnose MRSA infection nor to guide or monitor treatment for MRSA infections. Performed at [facility], [place] [address], [place], [place] [address]       Studies: DG Chest 1 View  Result Date: 01/12/2021 CLINICAL DATA:  Pain following fall EXAM: CHEST  1 VIEW COMPARISON:  September 19, 2020 FINDINGS: There is cardiomegaly with pulmonary venous hypertension. The interstitium is mildly prominent without overt pulmonary edema. No appreciable airspace opacity. No adenopathy. No pneumothorax. There is a skin fold on the left. No fracture evident. Postoperative change in lower cervical spine.  There is aortic atherosclerosis. IMPRESSION: Suspect underlying chronic bronchitis. No edema or consolidation. There is cardiomegaly with pulmonary vascular congestion. No pneumothorax. Aortic Atherosclerosis (ICD10-I70.0). Electronically Signed   By: Lowella Grip III M.D.   On: 01/12/2021 18:02   DG Pelvis 1-2 Views  Result Date: 01/12/2021 CLINICAL DATA:  Pain following fall EXAM: PELVIS - 1-2 VIEW COMPARISON:  None. FINDINGS: There is no evidence of pelvic fracture or dislocation. Joint spaces appear normal. Multiple foci of vascular calcification noted bilaterally. IMPRESSION: No fracture or dislocation. No evident arthropathy. Multiple foci of vascular calcification noted. Electronically Signed   By: Lowella Grip III M.D.   On: 01/12/2021 18:07   DG Elbow Complete Left  Result Date: 01/12/2021 CLINICAL DATA:  Pain following fall. Reported recent arm region surgery EXAM: LEFT ELBOW - COMPLETE 3+ VIEW COMPARISON:  None. FINDINGS: Frontal, lateral, and bilateral oblique views were obtained. There is soft tissue swelling. There is no appreciable fracture or dislocation. No joint effusion. No joint space narrowing or erosion. Multiple foci of arterial vascular calcification noted. Note that there is soft tissue air in the volar aspect of the distal humerus. IMPRESSION: No fracture or dislocation. Soft tissue swelling and soft tissue air present. A degree of infection within the soft tissues of the distal upper arm region is questioned. Nearby surgical clips. Multiple foci of arterial vascular calcification noted. Electronically Signed   By: Lowella Grip III M.D.   On: 01/12/2021 18:05   DG Forearm Left  Result Date: 01/12/2021 CLINICAL DATA:  Pain following fall.  Recent fistula surgery EXAM: LEFT FOREARM - 2 VIEW COMPARISON:  None. FINDINGS: Frontal and lateral views were obtained. No acute fracture or dislocation. No appreciable joint space narrowing or erosion. Multiple foci of vascular  calcification noted. Soft tissue air is noted volar to the distal upper extremity. IMPRESSION: Foci of soft tissue air may be have infectious etiology or may be secondary to recent surgery. Clinical assessment in this regard warranted. No acute fracture or dislocation. No appreciable joint space narrowing or erosion. Multiple foci of arterial vascular calcification noted. Electronically Signed   By: Lowella Grip III M.D.   On: 01/12/2021 18:06  CT Head Wo Contrast  Result Date: 01/12/2021 CLINICAL DATA:  Fall EXAM: CT HEAD WITHOUT CONTRAST CT CERVICAL SPINE WITHOUT CONTRAST TECHNIQUE: Multidetector CT imaging of the head and cervical spine was performed following the standard protocol without intravenous contrast. Multiplanar CT image reconstructions of the cervical spine were also generated. COMPARISON:  None. FINDINGS: CT HEAD FINDINGS Brain: No evidence of acute infarction, hemorrhage, hydrocephalus, extra-axial collection or mass lesion/mass effect. Periventricular white matter hypodensity. Vascular: No hyperdense vessel or unexpected calcification. Skull: Normal. Negative for fracture or focal lesion. Sinuses/Orbits: No acute finding. Other: None. CT CERVICAL SPINE FINDINGS Alignment: Degenerative straightening and reversal of the normal cervical lordosis. Skull base and vertebrae: No acute fracture. No primary bone lesion or focal pathologic process. Soft tissues and spinal canal: No prevertebral fluid or swelling. No visible canal hematoma. Disc levels: Status post anterior cervical discectomy and fusion of C5 through C7. Upper chest: Negative. Other: None. IMPRESSION: 1. No acute intracranial pathology. Small-vessel white matter disease. 2. No fracture or static subluxation of the cervical spine. 3. Status post anterior cervical discectomy and fusion of C5 through C7. Electronically Signed   By: Eddie Candle M.D.   On: 01/12/2021 17:50   CT Cervical Spine Wo Contrast  Result Date:  01/12/2021 CLINICAL DATA:  Fall EXAM: CT HEAD WITHOUT CONTRAST CT CERVICAL SPINE WITHOUT CONTRAST TECHNIQUE: Multidetector CT imaging of the head and cervical spine was performed following the standard protocol without intravenous contrast. Multiplanar CT image reconstructions of the cervical spine were also generated. COMPARISON:  None. FINDINGS: CT HEAD FINDINGS Brain: No evidence of acute infarction, hemorrhage, hydrocephalus, extra-axial collection or mass lesion/mass effect. Periventricular white matter hypodensity. Vascular: No hyperdense vessel or unexpected calcification. Skull: Normal. Negative for fracture or focal lesion. Sinuses/Orbits: No acute finding. Other: None. CT CERVICAL SPINE FINDINGS Alignment: Degenerative straightening and reversal of the normal cervical lordosis. Skull base and vertebrae: No acute fracture. No primary bone lesion or focal pathologic process. Soft tissues and spinal canal: No prevertebral fluid or swelling. No visible canal hematoma. Disc levels: Status post anterior cervical discectomy and fusion of C5 through C7. Upper chest: Negative. Other: None. IMPRESSION: 1. No acute intracranial pathology. Small-vessel white matter disease. 2. No fracture or static subluxation of the cervical spine. 3. Status post anterior cervical discectomy and fusion of C5 through C7. Electronically Signed   By: Eddie Candle M.D.   On: 01/12/2021 17:50    Scheduled Meds: . amLODipine  10 mg Oral Daily  . Chlorhexidine Gluconate Cloth  6 each Topical Q0600  . cholecalciferol  1,000 Units Oral Daily  . cinacalcet  60 mg Oral Q breakfast  . ferric citrate  420 mg Oral TID WC  . fluticasone furoate-vilanterol  1 puff Inhalation Daily  . heparin  5,000 Units Subcutaneous Q8H  . heparin sodium (porcine)      . hydrALAZINE  50 mg Oral BID  . mouth rinse  15 mL Mouth Rinse BID  . metoprolol tartrate  100 mg Oral BID  . multivitamin  1 tablet Oral Daily  . sevelamer carbonate  800 mg Oral Q  breakfast    Continuous Infusions:   LOS: 0 days     Alma Friendly, MD [facility]  If 7PM-7AM, please contact night-coverage [email or web address] 01/13/2021, 10:52 AM

## 2021-01-13 NOTE — Evaluation (Signed)
Physical Therapy Evaluation Patient Details Name: Danil Wedge MRN: [id number] DOB: [birth year] Today's Date: 01/13/2021   History of Present Illness  60 y.o. male [facility] ED on 01/12/2021 after being found down at home. Pt with recent fistual conversion on 01/10/2021. Pt reports increasing LUE swelling since surgery. Pt admitted with concern for volume overload and possible syncope. PMH includes ESRD on HDU TTS, chronic respiratory failure on 2L, stroke, CHF, HTN, HLD, anemia, PAH, Restless leg syndrome, anemia.  Clinical Impression  Pt presents to PT with deficits in activity tolerance, gait, functional mobility, power, and balance. PT evaluation is limited by orthostatic BP, with symptoms of dizziness and nausea. Pt is unable to attempt ambulation 2/2 these symptoms and low BP in standing. Pt will benefit from continued PT assessment of mobility to more accurately determine D/C recommendations. Pt does seem capable of transferring out of bed to recliner with 1 person assist if blood pressure is stable. PT will continue to follow.    Follow Up Recommendations Home health PT (hopeful for progression to outpatient or no PT needs)    Equipment Recommendations   (TBD)    Recommendations for Other Services       Precautions / Restrictions Precautions Precautions: Fall Precaution Comments: orthostatic Restrictions Weight Bearing Restrictions: No      Mobility  Bed Mobility Overal bed mobility: Modified Independent                  Transfers Overall transfer level: Needs assistance Equipment used: None Transfers: Sit to/from Stand Sit to Stand: Min guard            Ambulation/Gait Ambulation/Gait assistance:  (deferred 2/2 orthostatic BP, dizziness, nausea)              Stairs            Wheelchair Mobility    Modified Rankin (Stroke Patients Only)       Balance Overall balance assessment: Needs assistance Sitting-balance support: No upper  extremity supported;Feet supported Sitting balance-Leahy Scale: Fair     Standing balance support: No upper extremity supported Standing balance-Leahy Scale: Fair                               Pertinent Vitals/Pain Pain Assessment: 0-10 Pain Score: 6  Pain Location: head Pain Descriptors / Indicators: Headache Pain Intervention(s): Monitored during session    Home Living Family/patient expects to be discharged to:: Private residence Living Arrangements: Non-relatives/Friends Available Help at Discharge: Friend(s);Available PRN/intermittently Type of Home: House Home Access: Stairs to enter Entrance Stairs-Rails: None Entrance Stairs-Number of Steps: 2 Home Layout: One level Home Equipment: Cane - single point      Prior Function Level of Independence: Independent               Hand Dominance   Dominant Hand: Right    Extremity/Trunk Assessment   Upper Extremity Assessment Upper Extremity Assessment: Overall WFL for tasks assessed    Lower Extremity Assessment Lower Extremity Assessment: Generalized weakness    Cervical / Trunk Assessment Cervical / Trunk Assessment: Normal  Communication   Communication: No difficulties  Cognition Arousal/Alertness: Awake/alert Behavior During Therapy: WFL for tasks assessed/performed Overall Cognitive Status: Within Functional Limits for tasks assessed  General Comments General comments (skin integrity, edema, etc.): pt BP of 75/51 after standing with symptoms of dizziness. BP recovers to 111/60 in supine    Exercises     Assessment/Plan    PT Assessment Patient needs continued PT services  PT Problem List Decreased strength;Decreased activity tolerance;Decreased balance;Decreased mobility;Cardiopulmonary status limiting activity       PT Treatment Interventions DME instruction;Gait training;Stair training;Functional mobility  training;Therapeutic exercise;Therapeutic activities;Balance training;Patient/family education    PT Goals (Current goals can be found in the Care Plan section)  Acute Rehab PT Goals Patient Stated Goal: to feel better and return to independence PT Goal Formulation: With patient Time For Goal Achievement: 01/27/21 Potential to Achieve Goals: Good Additional Goals Additional Goal #1: Pt will score >19/24 on DGI to indicate a reduced risk for falls    Frequency Min 3X/week   Barriers to discharge        Co-evaluation               AM-PAC PT "6 Clicks" Mobility  Outcome Measure Help needed turning from your back to your side while in a flat bed without using bedrails?: None Help needed moving from lying on your back to sitting on the side of a flat bed without using bedrails?: None Help needed moving to and from a bed to a chair (including a wheelchair)?: A Little Help needed standing up from a chair using your arms (e.g., wheelchair or bedside chair)?: A Little Help needed to walk in hospital room?: A Little Help needed climbing 3-5 steps with a railing? : A Lot 6 Click Score: 19    End of Session   Activity Tolerance: Treatment limited secondary to medical complications (Comment) (orthostatic, nausea) Patient left: in bed;with call bell/phone within reach;with bed alarm set Nurse Communication: Mobility status PT Visit Diagnosis: Other abnormalities of gait and mobility (R26.89)    Time: 2633-3545 PT Time Calculation (min) (ACUTE ONLY): 17 min   Charges:   PT Evaluation $PT Eval Low Complexity: 1 Low          Zenaida Niece, PT, DPT Acute Rehabilitation Pager: [place] 01/13/2021, 3:04 PM

## 2021-01-13 NOTE — Progress Notes (Signed)
  Echocardiogram 2D Echocardiogram with 3D has been performed.  Darlina Sicilian M 01/13/2021, 2:45 PM

## 2021-01-13 NOTE — Plan of Care (Signed)
  Problem: Health Behavior/Discharge Planning: Goal: Ability to manage health-related needs will improve Outcome: Completed/Met   Problem: Clinical Measurements: Goal: Will remain free from infection Outcome: Completed/Met Goal: Diagnostic test results will improve Outcome: Completed/Met Goal: Respiratory complications will improve Outcome: Completed/Met   Problem: Activity: Goal: Risk for activity intolerance will decrease Outcome: Completed/Met   Problem: Nutrition: Goal: Adequate nutrition will be maintained Outcome: Completed/Met

## 2021-01-13 NOTE — Progress Notes (Signed)
Paged vascular regarding patient saying are and hand numb  Cant move fingers  Vascular called back and said remove wrap.

## 2021-01-13 NOTE — Consult Note (Signed)
Hospital Consult    Reason for Consult:  Pain and edema LUE Referring Physician:  Dr. Horris Latino MRN #:  [id number]  History of Present Illness: This is a 60 y.o. male recently was evaluated for swelling from his elbow to the forearm he subsequently underwent a conversion to upper arm cephalic vein fistula 3 days ago.  Since that time he states that he has swelling that is worsening heading up his upper arm.  He actually called the office yesterday and I discussed this with him.  He subsequently had a fall which he does not remember.  He had a skin tear in his forearm.  They have been using the fistula without issue in the upper arm for dialysis.  Concern is for flow in the hand as well as persistent swelling.  Past Medical History:  Diagnosis Date  . Anaphylactic shock, unspecified, sequela 06/10/2019  . Arthritis   . Asthma, chronic, unspecified asthma severity, with acute exacerbation 10/23/2017  . CAP (community acquired pneumonia) 10/23/2017  . Carpal tunnel syndrome of right wrist 10/05/2018  . Cataract    right - removed by surgery  . Cerebral infarction due to thrombosis of cerebral artery ([employer])   . Cervical disc herniation 01/04/2020  . Chronic diastolic heart failure ([place]) 04/13/2018  . Chronic low back pain 11/24/2019  . Constipation   . Cough    chronic cough  . Encounter for immunization 07/08/2017  . ESRD on hemodialysis ([place]) 02/16/2018   Dialysis  T Thus Sat  - [facility]   . Fall 06/04/2019  . GERD (gastroesophageal reflux disease) 06/04/2019  . GI bleeding 05/24/2017  . Gram-negative sepsis, unspecified ([place]) 09/05/2017  . History of fusion of cervical spine 03/26/2020  . Hyperlipidemia   . Hypertension   . Hypokalemia 06/04/2017  . Iron deficiency anemia, unspecified 09/09/2017  . Left shoulder pain 11/11/2019  . Lumbar radiculopathy 11/24/2019  . Lung contusion 06/04/2019  . LVH (left ventricular hypertrophy) due to hypertensive disease, with heart failure ([place])  06/18/2020  . Macrocytic anemia 05/24/2017  . Moderate protein-calorie malnutrition ([place]) 06/06/2017  . Myofascial pain syndrome 06/12/2020  . Neuritis of right ulnar nerve 09/13/2018  . Non-compliance with renal dialysis ([place]) 02/08/2020  . Oxygen deficiency 12/30/2019   O2 sats on RA 87% at PAT appt   . Pain in joint of right elbow 09/13/2018  . Pulmonary hypertension ([place])   . Renovascular hypertension 06/22/2020  . Restless leg syndrome   . Rib fractures 06/2019   Right  . Secondary hyperparathyroidism of renal origin ([place]) 06/04/2017  . Thoracic ascending aortic aneurysm ([facility])    4.5 cm 06/04/19 CT  . Ulnar neuropathy 10/05/2018  . Volume overload 10/23/2017    Past Surgical History:  Procedure Laterality Date  . A/V FISTULAGRAM N/A 01/01/2021   Procedure: A/V FISTULAGRAM;  Surgeon: Serafina Mitchell, MD;  Location: [place] [facility];  Service: Cardiovascular;  Laterality: N/A;  . ANTERIOR CERVICAL DECOMP/DISCECTOMY FUSION N/A 01/04/2020   Procedure: ANTERIOR CERVICAL DECOMPRESSION/DISCECTOMY FUSION CERVICAL FIVE THROUGH SEVEN;  Surgeon: Melina Schools, MD;  Location: [place];  Service: Orthopedics;  Laterality: N/A;  3 hrs  . AV FISTULA PLACEMENT Left 05/28/2017   Procedure: LEFT ARM ARTERIOVENOUS (AV) FISTULA CREATION;  Surgeon: Conrad Hernandez, MD;  Location: [place];  Service: Vascular;  Laterality: Left;  . EYE SURGERY Right 06/02/2019   Cataract removed  . FRACTURE SURGERY    . IR DIALY SHUNT INTRO NEEDLE/INTRACATH INITIAL W/IMG LEFT Left 09/12/2020  .  IR FLUORO GUIDE CV LINE RIGHT  05/25/2017  . IR US GUIDE VASC ACCESS LEFT  09/12/2020  . IR US GUIDE VASC ACCESS RIGHT  05/25/2017  . LIGATION OF ARTERIOVENOUS  FISTULA Left 01/10/2021   Procedure: LIGATION OF LEFT ARM RADIOCEPHALIC FISTULA;  Surgeon: Serafina Mitchell, MD;  Location: [place];  Service: Vascular;  Laterality: Left;  . REVISON OF ARTERIOVENOUS FISTULA Left 07/18/2019   Procedure: REVISION PLICATION OF RADIOCEPHALIC ARTERIOVENOUS  FISTULA LEFT ARM;  Surgeon: Angelia Mould, MD;  Location: [place];  Service: Vascular;  Laterality: Left;  . REVISON OF ARTERIOVENOUS FISTULA Left 01/10/2021   Procedure: CONVERSION TO BRACHIOCEPHALIC ARTERIOVENOUS FISTULA;  Surgeon: Serafina Mitchell, MD;  Location: [place];  Service: Vascular;  Laterality: Left;  . RINOPLASTY      No Known Allergies  Prior to Admission medications   Medication Sig Start Date End Date Taking? Authorizing Provider  acetaminophen (TYLENOL) 500 MG tablet Take 500 mg by mouth every 6 (six) hours as needed for moderate pain or headache.   Yes [provider]  albuterol (VENTOLIN HFA) 108 (90 Base) MCG/ACT inhaler Inhale 2 puffs into the lungs every 6 (six) hours as needed for wheezing or shortness of breath.    Yes [provider]  amLODipine (NORVASC) 10 MG tablet Take 10 mg by mouth daily.    Yes [provider]  AURYXIA 1 GM 210 MG(Fe) tablet Take 420 mg by mouth 3 (three) times daily with meals.  03/16/20  Yes [provider]  BREO ELLIPTA 100-25 MCG/INH AEPB Inhale 1 puff into the lungs daily. 06/26/20  Yes [provider]  cholecalciferol (VITAMIN D3) 25 MCG (1000 UNIT) tablet Take 1,000 Units by mouth daily.   Yes [provider]  cinacalcet (SENSIPAR) 60 MG tablet Take 60 mg by mouth daily.   Yes [provider]  DIPHENHYDRAMINE HCL PO Take 25 mg by mouth as needed (allergies). 09/24/20 09/23/21 Yes [provider]  doxercalciferol (HECTOROL) 0.5 MCG capsule Doxercalciferol (Hectorol) 11/26/20 11/25/21 Yes [provider]  furosemide (LASIX) 40 MG tablet Take 1 tablet (40 mg total) by mouth See admin instructions. On Sunday, Monday, Wednesday, Thursday and Saturday Patient taking differently: Take 40 mg by mouth daily as needed for fluid. 08/14/20  Yes Tobb, Kardie, DO  gabapentin (NEURONTIN) 300 MG capsule Take 300 mg by mouth 2 (two) times daily as needed (pain).    Yes  [provider]  hydrALAZINE (APRESOLINE) 25 MG tablet Take 50 mg by mouth in the morning and at bedtime.   Yes [provider]  metoprolol tartrate (LOPRESSOR) 100 MG tablet Take 100 mg by mouth 2 (two) times daily.    Yes [provider]  multivitamin (RENA-VIT) TABS tablet Take 1 tablet by mouth daily. 01/12/18  Yes [provider]  pregabalin (LYRICA) 25 MG capsule Take 25 mg by mouth daily as needed (pain). 11/29/20  Yes [provider]  sevelamer carbonate (RENVELA) 800 MG tablet Take 800 mg by mouth daily. 11/07/19  Yes [provider]  Specialty Vitamins Products (HEALTHY HEART COMPLEX PO) Take 1 tablet by mouth daily.   Yes [provider]  traMADol (ULTRAM) 50 MG tablet Take 1 tablet (50 mg total) by mouth every 8 (eight) hours as needed. Patient taking differently: Take 50 mg by mouth every 8 (eight) hours as needed for moderate pain. 01/10/21  Yes Rhyne, Hulen Shouts, PA-C  Methoxy PEG-Epoetin Beta (MIRCERA IJ) Mircera 10/01/20 09/30/21  [provider]    Social History   Socioeconomic History  . Marital status: Single    Spouse name: Not on file  . Number of children: Not on file  . Years of education: Not on file  . Highest education level: Not on file  Occupational History  . Not on file  Tobacco Use  . Smoking status: Never Smoker  . Smokeless tobacco: Never Used  Vaping Use  . Vaping Use: Never used  Substance and Sexual Activity  . Alcohol use: Not Currently    Alcohol/week: 0.0 standard drinks    Comment: has a drink once in a while- 12/30/19  . Drug use: No  . Sexual activity: Not Currently  Other Topics Concern  . Not on file  Social History Narrative   Lives with a roommate in a one story home.  Has 1 child.  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  Education: high school.   Social Determinants of Health   Financial Resource Strain: Not on file  Food Insecurity: Not on file  Transportation  Needs: Not on file  Physical Activity: Not on file  Stress: Not on file  Social Connections: Not on file  Intimate Partner Violence: Not on file    Family History  Problem Relation Age of Onset  . Cancer Mother     ROS:  Cardiovascular: []  chest pain/pressure []  palpitations []  SOB lying flat []  DOE []  pain in legs while walking []  pain in legs at rest []  pain in legs at night []  non-healing ulcers []  hx of DVT []  swelling in legs  Pulmonary: []  productive cough []  asthma/wheezing []  home O2  Neurologic: []  weakness in []  arms []  legs []  numbness in []  arms []  legs []  hx of CVA []  mini stroke [] difficulty speaking or slurred speech []  temporary loss of vision in one eye []  dizziness  Hematologic: []  hx of cancer []  bleeding problems []  problems with blood clotting easily  Endocrine:   []  diabetes []  thyroid disease  GI []  vomiting blood []  blood in stool  GU: []  CKD/renal failure []  HD--[]  M/W/F or []  T/T/S []  burning with urination []  blood in urine  Psychiatric: []  anxiety []  depression  Musculoskeletal: []  arthritis [x]  left upper extremity swelling  Integumentary: []  rashes []  ulcers  Constitutional: []  fever []  chills   Physical Examination  Vitals:   01/13/21 0513 01/13/21 0904  BP: 129/69 118/73  Pulse: 61 78  Resp: 18 16  Temp: 97.9 F (36.6 C) 99.6 F (37.6 C)  SpO2: 96% 97%   Body mass index is 20.31 kg/m.  General:  nad HENT: WNL, normocephalic Pulmonary: normal non-labored breathing Cardiac: 3+ left radial pulse Extremities: Swelling in the left elbow extending down the forearm is mild.  There is significant swelling of the left hand. Strong palpable thrill in the upper arm that can be traced all the way to the cephalic arch Neurologic: A&O X 3, grip strength on the left is somewhat decreased due to edema sensation is intact   CBC    Component Value Date/Time   WBC 4.6 01/13/2021 0459   RBC 2.81 (L)  01/13/2021 0459   HGB 9.7 (L) 01/13/2021 0459   HCT 30.4 (L) 01/13/2021 0459   HCT 27.3 (L) 05/17/2019 0203   PLT 165 01/13/2021 0459   MCV 108.2 (H) 01/13/2021 0459   MCH 34.5 (H) 01/13/2021 0459   MCHC 31.9 01/13/2021 0459   RDW 15.6 (H) 01/13/2021 0459  LYMPHSABS 0.5 (L) 01/12/2021 1658   MONOABS 0.4 01/12/2021 1658   EOSABS 0.1 01/12/2021 1658   BASOSABS 0.1 01/12/2021 1658    BMET    Component Value Date/Time   NA 134 (L) 01/13/2021 0459   NA 141 03/21/2020 1515   K 3.4 (L) 01/13/2021 0459   CL 92 (L) 01/13/2021 0459   CO2 32 01/13/2021 0459   GLUCOSE 88 01/13/2021 0459   BUN 14 01/13/2021 0459   BUN 53 (H) 03/21/2020 1515   CREATININE 3.45 (H) 01/13/2021 0459   CALCIUM 9.6 01/13/2021 0459   GFRNONAA 19 (L) 01/13/2021 0459   GFRAA 4 (L) 05/30/2020 1643    COAGS: Lab Results  Component Value Date   INR 1.2 01/12/2021   INR 1.1 01/04/2020   INR 1.14 07/06/2018     Non-Invasive Vascular Imaging:   Right Findings:  +----------+------------+---------+-----------+----------+-------+  RIGHT   CompressiblePhasicitySpontaneousPropertiesSummary  +----------+------------+---------+-----------+----------+-------+  Subclavian  Full    Yes    Yes             +----------+------------+---------+-----------+----------+-------+     Left Findings:  +----------+------------+---------+-----------+----------+-------+  LEFT   CompressiblePhasicitySpontaneousPropertiesSummary  +----------+------------+---------+-----------+----------+-------+  IJV      Full    Yes    Yes             +----------+------------+---------+-----------+----------+-------+  Subclavian  Full    Yes    Yes             +----------+------------+---------+-----------+----------+-------+  Axillary   Full    Yes    Yes              +----------+------------+---------+-----------+----------+-------+  Brachial   Full    Yes    Yes             +----------+------------+---------+-----------+----------+-------+  Radial    Full                        +----------+------------+---------+-----------+----------+-------+  Ulnar     Full                        +----------+------------+---------+-----------+----------+-------+  Cephalic   Full                        +----------+------------+---------+-----------+----------+-------+  Basilic    Full    Yes    Yes             +----------+------------+---------+-----------+----------+-------+   Patient negative for DVT however there is concern for disfunction of AVF  versus arterial disease. Hand is cold to the touch with decreased arterial  flow in forearm. would recommend vascular consult and further imaging.    Summary:    Right:  No evidence of thrombosis in the subclavian.    Left:  No evidence of deep vein thrombosis in the upper extremity. No evidence of  superficial vein thrombosis in the upper extremity.    ASSESSMENT/PLAN:  60 y.o. male history of end-stage renal disease on dialysis via left upper arm AV fistula which was recently converted from a forearm fistula to the upper arm.  He does have significant swelling in the hand.  He has a very strongly palpable left radial pulse in his hand has good sensation the motor function appears decreased secondary to significant edema.  Does not appear to have any steal component.  Fistula appears to be working well with palpable thrill all the way through the cephalic arch and work for dialysis yesterday.  Left upper extremity was wrapped with Ace wrap from the hand to the elbow staying below the current usable fistula site and I recommend elevating when he is not using his left hand.  At  this time I would not recommend any intervention.  I will notify Dr. Trula Slade of his admission  Evangelynn Lochridge C. Donzetta Matters, MD [facility] Office: [phone] Pager: [phone]

## 2021-01-13 NOTE — Plan of Care (Signed)
  Problem: Education: Goal: Knowledge of General Education information will improve Description: Including pain rating scale, medication(s)/side effects and non-pharmacologic comfort measures Outcome: Completed/Met

## 2021-01-14 ENCOUNTER — Encounter (HOSPITAL_COMMUNITY): Payer: Self-pay | Admitting: Internal Medicine

## 2021-01-14 ENCOUNTER — Other Ambulatory Visit: Payer: Self-pay

## 2021-01-14 DIAGNOSIS — R778 Other specified abnormalities of plasma proteins: Secondary | ICD-10-CM

## 2021-01-14 DIAGNOSIS — R0789 Other chest pain: Secondary | ICD-10-CM

## 2021-01-14 DIAGNOSIS — N186 End stage renal disease: Secondary | ICD-10-CM

## 2021-01-14 DIAGNOSIS — T82858A Stenosis of vascular prosthetic devices, implants and grafts, initial encounter: Secondary | ICD-10-CM

## 2021-01-14 DIAGNOSIS — Z992 Dependence on renal dialysis: Secondary | ICD-10-CM

## 2021-01-14 LAB — CBC WITH DIFFERENTIAL/PLATELET
Abs Immature Granulocytes: 0.03 10*3/uL (ref 0.00–0.07)
Basophils Absolute: 0.1 10*3/uL (ref 0.0–0.1)
Basophils Relative: 2 %
Eosinophils Absolute: 0.1 10*3/uL (ref 0.0–0.5)
Eosinophils Relative: 3 %
HCT: 30.6 % — ABNORMAL LOW (ref 39.0–52.0)
Hemoglobin: 9.7 g/dL — ABNORMAL LOW (ref 13.0–17.0)
Immature Granulocytes: 1 %
Lymphocytes Relative: 13 %
Lymphs Abs: 0.5 10*3/uL — ABNORMAL LOW (ref 0.7–4.0)
MCH: 35 pg — ABNORMAL HIGH (ref 26.0–34.0)
MCHC: 31.7 g/dL (ref 30.0–36.0)
MCV: 110.5 fL — ABNORMAL HIGH (ref 80.0–100.0)
Monocytes Absolute: 0.3 10*3/uL (ref 0.1–1.0)
Monocytes Relative: 8 %
Neutro Abs: 3 10*3/uL (ref 1.7–7.7)
Neutrophils Relative %: 73 %
Platelets: 180 10*3/uL (ref 150–400)
RBC: 2.77 MIL/uL — ABNORMAL LOW (ref 4.22–5.81)
RDW: 16.1 % — ABNORMAL HIGH (ref 11.5–15.5)
WBC: 4.1 10*3/uL (ref 4.0–10.5)
nRBC: 1 % — ABNORMAL HIGH (ref 0.0–0.2)

## 2021-01-14 LAB — RENAL FUNCTION PANEL
Albumin: 3.1 g/dL — ABNORMAL LOW (ref 3.5–5.0)
Anion gap: 10 (ref 5–15)
BUN: 29 mg/dL — ABNORMAL HIGH (ref 8–23)
CO2: 31 mmol/L (ref 22–32)
Calcium: 9.1 mg/dL (ref 8.9–10.3)
Chloride: 94 mmol/L — ABNORMAL LOW (ref 98–111)
Creatinine, Ser: 6.19 mg/dL — ABNORMAL HIGH (ref 0.61–1.24)
GFR, Estimated: 10 mL/min — ABNORMAL LOW (ref >60)
Glucose, Bld: 90 mg/dL (ref 70–99)
Phosphorus: 6.5 mg/dL — ABNORMAL HIGH (ref 2.5–4.6)
Potassium: 4.4 mmol/L (ref 3.5–5.1)
Sodium: 135 mmol/L (ref 135–145)

## 2021-01-14 LAB — TROPONIN I (HIGH SENSITIVITY)
Troponin I (High Sensitivity): 408 ng/L (ref <18)
Troponin I (High Sensitivity): 419 ng/L (ref <18)

## 2021-01-14 MED ORDER — METOPROLOL TARTRATE 50 MG PO TABS
50.0000 mg | ORAL_TABLET | Freq: Two times a day (BID) | ORAL | Status: DC
  Administered 2021-01-15: 50 mg via ORAL
  Filled 2021-01-14: qty 1

## 2021-01-14 MED ORDER — DOXERCALCIFEROL 4 MCG/2ML IV SOLN: 1.0000 ug | INTRAVENOUS | Status: DC

## 2021-01-14 MED ORDER — OXYCODONE HCL 5 MG PO TABS
5.0000 mg | ORAL_TABLET | ORAL | Status: DC | PRN
Start: 2021-01-14 — End: 2021-01-15
  Administered 2021-01-14 (×3): 5 mg via ORAL
  Filled 2021-01-14 (×3): qty 1

## 2021-01-14 MED ORDER — CHLORHEXIDINE GLUCONATE CLOTH 2 % EX PADS: 6.0000 | MEDICATED_PAD | Freq: Every day | CUTANEOUS | Status: DC

## 2021-01-14 NOTE — Evaluation (Signed)
Occupational Therapy Evaluation Patient Details Name: Thomas Robinson MRN: [id number] DOB: May 13, 1961 Today's Date: 01/14/2021    History of Present Illness 60 y.o. male presents to [facility] ED on 01/12/2021 after being found down at home. Pt with recent fistual conversion on 01/10/2021. Pt reports increasing LUE swelling since surgery. Pt admitted with concern for volume overload and possible syncope. PMH includes ESRD on HDU TTS, chronic respiratory failure on 2L, stroke, CHF, HTN, HLD, anemia, PAH, Restless leg syndrome, anemia.   Clinical Impression   Pt presents with decline in function and safety with ADLs and ADL mobility with impaired balance, endurance and ROM of L UE (edematous). PTA pt was Ind with ADLs/selfcare, used cane for mobility and was driving. Pt currently requires min guard A for LB selfcare, AD tasks in standing and with mobility. Pt would benefit from acute OT services to address impairments maximize level of function and safety    Follow Up Recommendations  Home health OT (no f/u OT depending on acute stay progress)   Equipment Recommendations  None recommended by OT    Recommendations for Other Services       Precautions / Restrictions Precautions Precautions: Fall Precaution Comments: watch BP, was orthostatic with PT 01/13/2021 Restrictions Weight Bearing Restrictions: No      Mobility Bed Mobility Overal bed mobility: Modified Independent                  Transfers   Equipment used: None Transfers: Sit to/from Stand Sit to Stand: Min guard              Balance Overall balance assessment: Needs assistance Sitting-balance support: No upper extremity supported;Feet supported Sitting balance-Leahy Scale: Fair     Standing balance support: No upper extremity supported Standing balance-Leahy Scale: Fair                             ADL either performed or assessed with clinical judgement   ADL Overall ADL's : Needs  assistance/impaired Eating/Feeding: Set up;Independent;Sitting   Grooming: Wash/dry hands;Wash/dry face;Min guard;Standing   Upper Body Bathing: Set up;Sitting   Lower Body Bathing: Min guard;Sit to/from stand   Upper Body Dressing : Set up;Sitting   Lower Body Dressing: Min guard;Sit to/from stand   Toilet Transfer: Min guard;Ambulation;Comfort height toilet;Cueing for safety   Toileting- Clothing Manipulation and Hygiene: Min guard;Sit to/from stand   Tub/ Shower Transfer: Min guard;Ambulation   Functional mobility during ADLs: Min guard       Vision Patient Visual Report: No change from baseline       Perception     Praxis      Pertinent Vitals/Pain Pain Assessment: Faces Faces Pain Scale: Hurts little more Pain Location: L UE Pain Descriptors / Indicators: Discomfort;Heaviness Pain Intervention(s): Premedicated before session;Monitored during session;Repositioned     Hand Dominance Right   Extremity/Trunk Assessment Upper Extremity Assessment Upper Extremity Assessment: Generalized weakness;LUE deficits/detail LUE Deficits / Details: edema, pain   Lower Extremity Assessment Lower Extremity Assessment: Defer to PT evaluation   Cervical / Trunk Assessment Cervical / Trunk Assessment: Normal   Communication Communication Communication: No difficulties   Cognition Arousal/Alertness: Awake/alert Behavior During Therapy: WFL for tasks assessed/performed Overall Cognitive Status: Within Functional Limits for tasks assessed  General Comments       Exercises     Shoulder Instructions      Home Living Family/patient expects to be discharged to:: Private residence Living Arrangements: Non-relatives/Friends Available Help at Discharge: Friend(s);Available PRN/intermittently Type of Home: House Home Access: Stairs to enter [employer] of Steps: 2 Entrance Stairs-Rails: None Home Layout: One  level     Bathroom Shower/Tub: Teacher, early years/pre: Standard     Home Equipment: Cane - single point          Prior Functioning/Environment Level of Independence: Independent        Comments: Independent with all ADLs, IADLs. Drives self to dialysis        OT Problem List: Impaired balance (sitting and/or standing);Decreased strength;Pain;Decreased range of motion;Decreased activity tolerance;Decreased coordination      OT Treatment/Interventions: Self-care/ADL training;Therapeutic exercise;Patient/family education;Therapeutic activities;DME and/or AE instruction;Balance training    OT Goals(Current goals can be found in the care plan section) Acute Rehab OT Goals Patient Stated Goal: to feel better and return to independence OT Goal Formulation: With patient Time For Goal Achievement: 01/28/21 Potential to Achieve Goals: Good ADL Goals Pt Will Perform Grooming: with supervision;with set-up;with modified independence;standing Pt Will Perform Lower Body Bathing: with supervision;with set-up;with modified independence;sit to/from stand Pt Will Perform Lower Body Dressing: with supervision;with set-up;with modified independence;sit to/from stand Pt Will Transfer to Toilet: with supervision;with modified independence;ambulating Pt Will Perform Toileting - Clothing Manipulation and hygiene: with supervision;with modified independence;sit to/from stand Pt Will Perform Tub/Shower Transfer: with supervision;with modified independence;ambulating Additional ADL Goal #1: Pt will maintain elevated position of L UE when supine and seated for edema control Additional ADL Goal #2: Pt will perform L UE A/PROM to decrease edema and increase use for fuctional tasks  OT Frequency: Min 2X/week   Barriers to D/C:            Co-evaluation              AM-PAC OT "6 Clicks" Daily Activity     Outcome Measure Help from another person eating meals?: None Help from  another person taking care of personal grooming?: A Little Help from another person toileting, which includes using toliet, bedpan, or urinal?: A Little Help from another person bathing (including washing, rinsing, drying)?: A Little Help from another person to put on and taking off regular upper body clothing?: None Help from another person to put on and taking off regular lower body clothing?: A Little 6 Click Score: 20   End of Session Equipment Utilized During Treatment: Gait belt Nurse Communication: Mobility status  Activity Tolerance: Patient tolerated treatment well Patient left: in chair;with chair alarm set  OT Visit Diagnosis: Other abnormalities of gait and mobility (R26.89);Unsteadiness on feet (R26.81);Pain Pain - Right/Left: Left Pain - part of body: Arm                Time: 6503-5465 OT Time Calculation (min): 26 min Charges:  OT General Charges $OT Visit: 1 Visit OT Evaluation $OT Eval Low Complexity: 1 Low OT Treatments $Self Care/Home Management : 8-22 mins    Britt Bottom 01/14/2021, 12:47 PM

## 2021-01-14 NOTE — Progress Notes (Signed)
Physical Therapy Treatment Patient Details Name: Thomas Robinson MRN: [id number] DOB: 1960-12-13 Today's Date: 01/14/2021    History of Present Illness 60 y.o. male presents to [facility] ED on 01/12/2021 after being found down at home. Pt with recent fistual conversion on 01/10/2021. Pt reports increasing LUE swelling since surgery. Pt admitted with concern for volume overload and possible syncope. PMH includes ESRD on HDU TTS, chronic respiratory failure on 2L, stroke, CHF, HTN, HLD, anemia, PAH, Restless leg syndrome, anemia.    PT Comments    Continuing work on functional mobility and activity tolerance;  Much better able to tolerate standing positioning with no signficant BP drop (See vitals flow sheet.); Reports feeling fatigued and requests waiting on progressive ambulation   Follow Up Recommendations  Home health PT (hopeful for progression to outpatient or no PT needs)     Equipment Recommendations   (TBD)    Recommendations for Other Services       Precautions / Restrictions Precautions Precautions: Fall Precaution Comments: watch BP, was orthostatic with PT 01/13/2021    Mobility  Bed Mobility Overal bed mobility: Modified Independent                  Transfers Overall transfer level: Needs assistance Equipment used: None Transfers: Sit to/from Stand Sit to Stand: Min guard         General transfer comment: No overt difficulty; notable use of UEs on bedrail to steady at initial stand  Ambulation/Gait             General Gait Details: Pt politely declined when I offered   Stairs             Wheelchair Mobility    Modified Rankin (Stroke Patients Only)       Balance Overall balance assessment: Needs assistance Sitting-balance support: No upper extremity supported;Feet supported Sitting balance-Leahy Scale: Fair     Standing balance support: No upper extremity supported Standing balance-Leahy Scale: Fair                               Cognition Arousal/Alertness: Awake/alert Behavior During Therapy: WFL for tasks assessed/performed Overall Cognitive Status: Within Functional Limits for tasks assessed                                        Exercises      General Comments General comments (skin integrity, edema, etc.): BPs did not show a significant drop with upright sitting and standing      Pertinent Vitals/Pain Pain Assessment: Faces Faces Pain Scale: Hurts a little bit Pain Location: L UE Pain Descriptors / Indicators: Discomfort;Heaviness Pain Intervention(s): Monitored during session    Home Living                      Prior Function            PT Goals (current goals can now be found in the care plan section) Acute Rehab PT Goals Patient Stated Goal: to feel better and return to independence PT Goal Formulation: With patient Time For Goal Achievement: 01/27/21 Potential to Achieve Goals: Good Progress towards PT goals: Progressing toward goals    Frequency    Min 3X/week      PT Plan Current plan remains appropriate    Co-evaluation  AM-PAC PT "6 Clicks" Mobility   Outcome Measure  Help needed turning from your back to your side while in a flat bed without using bedrails?: None Help needed moving from lying on your back to sitting on the side of a flat bed without using bedrails?: None Help needed moving to and from a bed to a chair (including a wheelchair)?: A Little Help needed standing up from a chair using your arms (e.g., wheelchair or bedside chair)?: A Little Help needed to walk in hospital room?: A Little Help needed climbing 3-5 steps with a railing? : A Lot 6 Click Score: 19    End of Session   Activity Tolerance: Patient tolerated treatment well Patient left: in bed;with call bell/phone within reach;with bed alarm set Nurse Communication: Mobility status PT Visit Diagnosis: Other abnormalities of gait and mobility  (R26.89)     Time: 2878-6767 PT Time Calculation (min) (ACUTE ONLY): 21 min  Charges:  $Therapeutic Activity: 8-22 mins                     Roney Marion, PT  Acute Rehabilitation Services Pager [phone] Office [place] 01/14/2021, 4:33 PM

## 2021-01-14 NOTE — Progress Notes (Signed)
Patient has refused hemodialysis treatment today, RN educated patient and notified MD for nephrology, no new orders have been placed at this time, and RN will continue to monitor this patient.

## 2021-01-14 NOTE — Progress Notes (Signed)
[facility] Progress Note   Subjective:   Patient seen and examined at bedside.  Left arm remains swollen and sore but no other complaints.  CXR this AM showed pulmonary edema with improved aeration b/l.  He is asking to go home today.  Denies CP, SOB, n/v/d, abdominal pain, weakness and fatigue.   Objective Vitals:   01/14/21 0218 01/14/21 0506 01/14/21 0925 01/14/21 0927  BP:  112/63    Pulse: 69 (!) 48    Resp: 20 18    Temp:  98.6 F (37 C)    TempSrc:      SpO2: 96% 98% (!) 85% 95%  Weight:      Height:       Physical Exam General:well appearing male in NAD Heart:RRR Lungs:CTAB, nml WOB on 2L via [place] Abdomen:soft, NTND Extremities:no LE edema, LUE edema with forearm in ace wrap Dialysis Access: LU AVF +b/t   Filed Weights   01/12/21 1702 01/12/21 2127  Weight: 59 kg 60.6 kg    Intake/Output Summary (Last 24 hours) at 01/14/2021 1141 Last data filed at 01/14/2021 0800 Gross per 24 hour  Intake 837 ml  Output 0 ml  Net 837 ml    Additional Objective Labs: Basic Metabolic Panel: Recent Labs  Lab 01/12/21 1658 01/12/21 1704 01/12/21 1744 01/13/21 0459 01/14/21 0627  NA 137 134* 135 134* 135  K 5.1 4.8 4.8 3.4* 4.4  CL 98 98  --  92* 94*  CO2 25  --   --  32 31  GLUCOSE 88 85  --  88 90  BUN 58* 59*  --  14 29*  CREATININE 8.63* 8.60*  --  3.45* 6.19*  CALCIUM 9.6  --   --  9.6 9.1  PHOS  --   --   --   --  6.5*   Liver Function Tests: Recent Labs  Lab 01/12/21 1658 01/14/21 0627  AST 19  --   ALT <5  --   ALKPHOS 71  --   BILITOT 1.4*  --   PROT 6.4*  --   ALBUMIN 3.5 3.1*   CBC: Recent Labs  Lab 01/12/21 1658 01/12/21 1704 01/12/21 1744 01/13/21 0459 01/14/21 0627  WBC 6.5  --   --  4.6 4.1  NEUTROABS 5.4  --   --   --  3.0  HGB 9.5*   < > 9.9* 9.7* 9.7*  HCT 29.9*   < > 29.0* 30.4* 30.6*  MCV 111.6*  --   --  108.2* 110.5*  PLT 183  --   --  165 180   < > = values in this interval not displayed.    Cardiac  Enzymes: Recent Labs  Lab 01/12/21 1658  CKTOTAL 42*   CBG: Recent Labs  Lab 01/10/21 1039 01/12/21 1847  GLUCAP 211* 88   Studies/Results: DG Chest 1 View  Result Date: 01/12/2021 CLINICAL DATA:  Pain following fall EXAM: CHEST  1 VIEW COMPARISON:  September 19, 2020 FINDINGS: There is cardiomegaly with pulmonary venous hypertension. The interstitium is mildly prominent without overt pulmonary edema. No appreciable airspace opacity. No adenopathy. No pneumothorax. There is a skin fold on the left. No fracture evident. Postoperative change in lower cervical spine. There is aortic atherosclerosis. IMPRESSION: Suspect underlying chronic bronchitis. No edema or consolidation. There is cardiomegaly with pulmonary vascular congestion. No pneumothorax. Aortic Atherosclerosis (ICD10-I70.0). Electronically Signed   By: Lowella Grip III M.D.   On: 01/12/2021 18:02   DG Pelvis  1-2 Views  Result Date: 01/12/2021 CLINICAL DATA:  Pain following fall EXAM: PELVIS - 1-2 VIEW COMPARISON:  None. FINDINGS: There is no evidence of pelvic fracture or dislocation. Joint spaces appear normal. Multiple foci of vascular calcification noted bilaterally. IMPRESSION: No fracture or dislocation. No evident arthropathy. Multiple foci of vascular calcification noted. Electronically Signed   By: Lowella Grip III M.D.   On: 01/12/2021 18:07   DG Elbow Complete Left  Result Date: 01/12/2021 CLINICAL DATA:  Pain following fall. Reported recent arm region surgery EXAM: LEFT ELBOW - COMPLETE 3+ VIEW COMPARISON:  None. FINDINGS: Frontal, lateral, and bilateral oblique views were obtained. There is soft tissue swelling. There is no appreciable fracture or dislocation. No joint effusion. No joint space narrowing or erosion. Multiple foci of arterial vascular calcification noted. Note that there is soft tissue air in the volar aspect of the distal humerus. IMPRESSION: No fracture or dislocation. Soft tissue swelling and  soft tissue air present. A degree of infection within the soft tissues of the distal upper arm region is questioned. Nearby surgical clips. Multiple foci of arterial vascular calcification noted. Electronically Signed   By: Lowella Grip III M.D.   On: 01/12/2021 18:05   DG Forearm Left  Result Date: 01/12/2021 CLINICAL DATA:  Pain following fall.  Recent fistula surgery EXAM: LEFT FOREARM - 2 VIEW COMPARISON:  None. FINDINGS: Frontal and lateral views were obtained. No acute fracture or dislocation. No appreciable joint space narrowing or erosion. Multiple foci of vascular calcification noted. Soft tissue air is noted volar to the distal upper extremity. IMPRESSION: Foci of soft tissue air may be have infectious etiology or may be secondary to recent surgery. Clinical assessment in this regard warranted. No acute fracture or dislocation. No appreciable joint space narrowing or erosion. Multiple foci of arterial vascular calcification noted. Electronically Signed   By: Lowella Grip III M.D.   On: 01/12/2021 18:06   CT Head Wo Contrast  Result Date: 01/12/2021 CLINICAL DATA:  Fall EXAM: CT HEAD WITHOUT CONTRAST CT CERVICAL SPINE WITHOUT CONTRAST TECHNIQUE: Multidetector CT imaging of the head and cervical spine was performed following the standard protocol without intravenous contrast. Multiplanar CT image reconstructions of the cervical spine were also generated. COMPARISON:  None. FINDINGS: CT HEAD FINDINGS Brain: No evidence of acute infarction, hemorrhage, hydrocephalus, extra-axial collection or mass lesion/mass effect. Periventricular white matter hypodensity. Vascular: No hyperdense vessel or unexpected calcification. Skull: Normal. Negative for fracture or focal lesion. Sinuses/Orbits: No acute finding. Other: None. CT CERVICAL SPINE FINDINGS Alignment: Degenerative straightening and reversal of the normal cervical lordosis. Skull base and vertebrae: No acute fracture. No primary bone lesion  or focal pathologic process. Soft tissues and spinal canal: No prevertebral fluid or swelling. No visible canal hematoma. Disc levels: Status post anterior cervical discectomy and fusion of C5 through C7. Upper chest: Negative. Other: None. IMPRESSION: 1. No acute intracranial pathology. Small-vessel white matter disease. 2. No fracture or static subluxation of the cervical spine. 3. Status post anterior cervical discectomy and fusion of C5 through C7. Electronically Signed   By: Eddie Candle M.D.   On: 01/12/2021 17:50   CT Cervical Spine Wo Contrast  Result Date: 01/12/2021 CLINICAL DATA:  Fall EXAM: CT HEAD WITHOUT CONTRAST CT CERVICAL SPINE WITHOUT CONTRAST TECHNIQUE: Multidetector CT imaging of the head and cervical spine was performed following the standard protocol without intravenous contrast. Multiplanar CT image reconstructions of the cervical spine were also generated. COMPARISON:  None. FINDINGS: CT HEAD FINDINGS  Brain: No evidence of acute infarction, hemorrhage, hydrocephalus, extra-axial collection or mass lesion/mass effect. Periventricular white matter hypodensity. Vascular: No hyperdense vessel or unexpected calcification. Skull: Normal. Negative for fracture or focal lesion. Sinuses/Orbits: No acute finding. Other: None. CT CERVICAL SPINE FINDINGS Alignment: Degenerative straightening and reversal of the normal cervical lordosis. Skull base and vertebrae: No acute fracture. No primary bone lesion or focal pathologic process. Soft tissues and spinal canal: No prevertebral fluid or swelling. No visible canal hematoma. Disc levels: Status post anterior cervical discectomy and fusion of C5 through C7. Upper chest: Negative. Other: None. IMPRESSION: 1. No acute intracranial pathology. Small-vessel white matter disease. 2. No fracture or static subluxation of the cervical spine. 3. Status post anterior cervical discectomy and fusion of C5 through C7. Electronically Signed   By: Eddie Candle M.D.    On: 01/12/2021 17:50   DG CHEST PORT 1 VIEW  Result Date: 01/14/2021 CLINICAL DATA:  Follow-up EXAM: PORTABLE CHEST - 1 VIEW COMPARISON:  01/12/2021 FINDINGS: The mediastinal contours are within normal limits. Unchanged moderate cardiomegaly. Improved aeration of the lungs bilaterally with mild persistent scattered peribronchovascular patchy opacities. No significant pleural effusion or pneumothorax. Atherosclerotic calcification of the aortic arch. Multilevel cervicothoracic anterior fusion hardware remains in place without complicating feature. No acute osseous abnormality. IMPRESSION: 1. Improved aeration bilaterally compatible with improving mild pulmonary edema. 2. Stable moderate cardiomegaly. 3.  Aortic Atherosclerosis (ICD10-I70.0). Electronically Signed   By: Ruthann Cancer MD   On: 01/14/2021 08:23   ECHOCARDIOGRAM COMPLETE  Result Date: 01/13/2021    ECHOCARDIOGRAM REPORT   Patient Name:   Thomas Robinson Date of Exam: 01/13/2021 Medical Rec #:  [id number]        Height:       68.0 in Accession #:    [id number]       Weight:       133.6 lb Date of Birth:  November 27, 1960        BSA:          1.722 m Patient Age:    5 years         BP:           118/73 mmHg Patient Gender: M                HR:           78 bpm. Exam Location:  Inpatient Procedure: 2D Echo, 3D Echo, Cardiac Doppler and Color Doppler Indications:    Syncope R55  History:        Patient has prior history of Echocardiogram examinations, most                 recent 05/17/2019. CHF, Pulmonary HTN; Risk Factors:Hypertension                 and Dyslipidemia. End stage renal disease, Macrocytic anemia.                 GERD. Thoracic ascending aortic aneurysm 4.5 by CT.  Sonographer:    Darlina Sicilian RDCS Referring Phys: [id number] [place]  Sonographer Comments: Global longitudinal strain was attempted. IMPRESSIONS  1. Left ventricular ejection fraction, by estimation, is 60 to 65%. The left ventricle has normal function. The left  ventricle has no regional wall motion abnormalities. There is moderate concentric left ventricular hypertrophy. Left ventricular diastolic parameters are consistent with Grade I diastolic dysfunction (impaired relaxation).  2. Right ventricular systolic function is mildly reduced. The right ventricular size is  mildly enlarged. There is severely elevated pulmonary artery systolic pressure. The estimated right ventricular systolic pressure is 38.4 mmHg.  3. There is a trivial pericardial effusion posterior to the left ventricle.  4. The mitral valve is abnormal. Mild mitral valve regurgitation. Moderate mitral annular calcification.  5. Tricuspid valve regurgitation is moderate.  6. The aortic valve is tricuspid. There is moderate calcification of the aortic valve. Aortic valve regurgitation is mild. Mild to moderate aortic valve stenosis. Aortic valve mean gradient measures 12.0 mmHg. Dimentionless index 0.58.  7. Aortic dilatation noted. There is mild dilatation of the ascending aorta, measuring 41 mm.  8. The inferior vena cava is dilated in size with <50% respiratory variability, suggesting right atrial pressure of 15 mmHg. FINDINGS  Left Ventricle: Left ventricular ejection fraction, by estimation, is 60 to 65%. The left ventricle has normal function. The left ventricle has no regional wall motion abnormalities. The left ventricular internal cavity size was small. There is moderate  concentric left ventricular hypertrophy. Left ventricular diastolic parameters are consistent with Grade I diastolic dysfunction (impaired relaxation). Right Ventricle: The right ventricular size is mildly enlarged. Right vetricular wall thickness was not assessed. Right ventricular systolic function is mildly reduced. There is severely elevated pulmonary artery systolic pressure. The tricuspid regurgitant velocity is 3.87 m/s, and with an assumed right atrial pressure of 15 mmHg, the estimated right ventricular systolic pressure is  66.5 mmHg. Left Atrium: Left atrial size was normal in size. Right Atrium: Right atrial size was normal in size. Pericardium: Trivial pericardial effusion is present. The pericardial effusion is posterior to the left ventricle. Mitral Valve: The mitral valve is abnormal. There is mild calcification of the mitral valve leaflet(s). Moderate mitral annular calcification. Mild mitral valve regurgitation. Tricuspid Valve: The tricuspid valve is grossly normal. Tricuspid valve regurgitation is moderate. Aortic Valve: The aortic valve is tricuspid. There is moderate calcification of the aortic valve. There is mild aortic valve annular calcification. Aortic valve regurgitation is mild. Aortic regurgitation PHT measures 325 msec. Mild to moderate aortic stenosis is present. Aortic valve mean gradient measures 12.0 mmHg. Aortic valve peak gradient measures 23.2 mmHg. Aortic valve area, by VTI measures 1.46 cm. Pulmonic Valve: The pulmonic valve was grossly normal. Pulmonic valve regurgitation is trivial. Aorta: The aortic root is normal in size and structure and aortic dilatation noted. There is mild dilatation of the ascending aorta, measuring 41 mm. Venous: The inferior vena cava is dilated in size with less than 50% respiratory variability, suggesting right atrial pressure of 15 mmHg. IAS/Shunts: No atrial level shunt detected by color flow Doppler.  LEFT VENTRICLE PLAX 2D LVIDd:         3.80 cm     Diastology LVIDs:         2.40 cm     LV e' medial:    3.44 cm/s LV PW:         1.00 cm     LV E/e' medial:  25.4 LV IVS:        1.10 cm     LV e' lateral:   4.26 cm/s LVOT diam:     1.80 cm     LV E/e' lateral: 20.5 LV SV:         54 LV SV Index:   32 LVOT Area:     2.54 cm  LV Volumes (MOD) LV vol d, MOD A2C: 74.7 ml LV vol d, MOD A4C: 67.4 ml LV vol s, MOD A2C: 43.2 ml LV vol  s, MOD A4C: 33.1 ml LV SV MOD A2C:     31.5 ml LV SV MOD A4C:     67.4 ml LV SV MOD BP:      34.2 ml RIGHT VENTRICLE RV S prime:     14.90 cm/s TAPSE  (M-mode): 2.0 cm LEFT ATRIUM             Index       RIGHT ATRIUM           Index LA diam:        2.80 cm 1.63 cm/m  RA Area:     18.70 cm LA Vol (A2C):   66.2 ml 38.45 ml/m RA Volume:   52.80 ml  30.67 ml/m LA Vol (A4C):   40.7 ml 23.64 ml/m LA Biplane Vol: 57.9 ml 33.63 ml/m  AORTIC VALVE AV Area (Vmax):    1.26 cm AV Area (Vmean):   1.31 cm AV Area (VTI):     1.46 cm AV Vmax:           241.00 cm/s AV Vmean:          156.000 cm/s AV VTI:            0.372 m AV Peak Grad:      23.2 mmHg AV Mean Grad:      12.0 mmHg LVOT Vmax:         119.00 cm/s LVOT Vmean:        80.300 cm/s LVOT VTI:          0.214 m LVOT/AV VTI ratio: 0.58 AI PHT:            325 msec  AORTA Ao Root diam: 3.70 cm Ao Asc diam:  4.10 cm MITRAL VALVE                TRICUSPID VALVE MV Area (PHT): 1.66 cm     TR Peak grad:   59.9 mmHg MV Decel Time: 458 msec     TR Vmax:        387.00 cm/s MV E velocity: 87.40 cm/s MV A velocity: 113.00 cm/s  SHUNTS MV E/A ratio:  0.77         Systemic VTI:  0.21 m                             Systemic Diam: 1.80 cm Rozann Lesches MD Electronically signed by Rozann Lesches MD Signature Date/Time: 01/13/2021/2:53:42 PM    Final    VAS [place] UPPER EXTREMITY VENOUS DUPLEX  Result Date: 01/13/2021 UPPER VENOUS STUDY  Patient Name:  Thomas Robinson  Date of Exam:   01/13/2021 Medical Rec #: [id number]         Accession #:    [id number] Date of Birth: [birth year]         Patient Gender: M Patient Age:   59Y Exam Location[facility] Procedure:      VAS [place] UPPER EXTREMITY VENOUS DUPLEX Referring Phys: [id number] JARED M GARDNER --------------------------------------------------------------------------------  Indications: Pain, and Swelling Risk Factors: Surgery - Recent ligation of radiocephalic AVF and creation of brachiocephalic AVF Recent fall with skin tear to LUE. Comparison Study: Previous 07/03/20 - negative Performing Technologist: Rogelia Rohrer  Examination Guidelines: A complete evaluation includes B-mode  imaging, spectral Doppler, color Doppler, and power Doppler as needed of all accessible portions of each vessel. Bilateral testing is considered an integral part of a complete examination. Limited  examinations for reoccurring indications may be performed as noted.  Right Findings: +----------+------------+---------+-----------+----------+-------+ RIGHT     CompressiblePhasicitySpontaneousPropertiesSummary +----------+------------+---------+-----------+----------+-------+ Subclavian    Full       Yes       Yes                      +----------+------------+---------+-----------+----------+-------+  Left Findings: +----------+------------+---------+-----------+----------+-------+ LEFT      CompressiblePhasicitySpontaneousPropertiesSummary +----------+------------+---------+-----------+----------+-------+ IJV           Full       Yes       Yes                      +----------+------------+---------+-----------+----------+-------+ Subclavian    Full       Yes       Yes                      +----------+------------+---------+-----------+----------+-------+ Axillary      Full       Yes       Yes                      +----------+------------+---------+-----------+----------+-------+ Brachial      Full       Yes       Yes                      +----------+------------+---------+-----------+----------+-------+ Radial        Full                                          +----------+------------+---------+-----------+----------+-------+ Ulnar         Full                                          +----------+------------+---------+-----------+----------+-------+ Cephalic      Full                                          +----------+------------+---------+-----------+----------+-------+ Basilic       Full       Yes       Yes                      +----------+------------+---------+-----------+----------+-------+ Patient negative for DVT however there is  concern for disfunction of AVF versus arterial disease. Hand is cold to the touch with decreased arterial flow in forearm. would recommend vascular consult and further imaging.  Summary:  Right: No evidence of thrombosis in the subclavian.  Left: No evidence of deep vein thrombosis in the upper extremity. No evidence of superficial vein thrombosis in the upper extremity.  *See table(s) above for measurements and observations.  Vascular consult recommended. Diagnosing physician: Servando Snare MD Electronically signed by Servando Snare MD on 01/13/2021 at 12:06:47 PM.    Final     Medications:  . amLODipine  10 mg Oral Daily  . Chlorhexidine Gluconate Cloth  6 each Topical Q0600  . cholecalciferol  1,000 Units Oral Daily  . cinacalcet  60 mg Oral Q breakfast  . ferric citrate  420 mg Oral TID WC  . fluticasone furoate-vilanterol  1  puff Inhalation Daily  . heparin  5,000 Units Subcutaneous Q8H  . hydrALAZINE  50 mg Oral BID  . mouth rinse  15 mL Mouth Rinse BID  . metoprolol tartrate  100 mg Oral BID  . multivitamin  1 tablet Oral Daily  . sevelamer carbonate  800 mg Oral Q breakfast    Dialysis Orders: [facility] - MWF 4hrs  56.5kg, 2K 2Ca LU AVF Hep 2000 qHD mircera 266mcg q2wks - last 5/9 venofer 50mg  qwk Hectorol 43mcg qHD   Assessment/Plan: 1. Hypoxemia/ vol overload/ pulm edema - volume status improved, no edema, CXR this AM with improved aeration/improving pulmonary edema.  Remains on 2L O2, wean down to RA.    2. ESRD - on HD MWF.  HD here 5/14 off schedule due to volume overload.  Orders written for HD today per regular schedule.  Truncated treatment due to increased patient volume.    3. Anemia ckd - Hb 9.7 here, recently dosed with ESA.  4. MBD ckd - Calcium at goal.  Phos elevated.  Continue binders and VDRA.  5. HTN - blood pressure well controlled. Continue home meds.  6. LUE edema/chronic L hand/forearm pain - VVS following, did recent access surgery (ligated LFA AVF andcreated a  ready-to-useLUA AVF) w/ hopes of improving LUE swelling.Hand pain improved 7. Nutrition - Renal diet w/fluid restrictions   Jen Mow, PA-C [place] 01/14/2021,11:41 AM  LOS: 1 day

## 2021-01-14 NOTE — Progress Notes (Signed)
Lab informed RN that patient had elevated troponin 409, RN alerted MD and awaiting new orders, RN will continue to monitor this patient.

## 2021-01-14 NOTE — Progress Notes (Signed)
PROGRESS NOTE  Thomas Robinson HAL:[id number] DOB: 1961/03/08 DOA: 01/12/2021 PCP: [name], PA  HPI/Recap of past 24 hours: HPI from Dr Danelle Earthly Thomas Robinson is a 60 y.o. male with medical history significant of ESRD on MWF, HTN, dCHF who lives with a friend and was found down on 01/12/21. Pt unclear how long he was down. Doesn't remember what happened. Pt also with h/o compliance issues with dialysis. Pt with h/o chronic hypoxic resp failure, supposed to be on 2L home O2 (not really sure how much he uses it though) as he sats low 80s on RA at baseline. Pt recently had a ligation of his left arm radiocephalic fistula, that was converted to a brachiocephalic AVG by Dr Trula Slade on 01/10/21. Reports pain and swelling to LUE for months, with notable L hand swelling. Also c/o L shoulder pain. In the ED, noted to be requiring about 5L O2. CXR shows pulm vascular congestion. Nephrology consulted. Pt admitted for further management.     Today, patient continues to complain of left upper extremity swelling and pain.  Denies any chest pain, worsening shortness of breath, nausea/vomiting, fever/chills.    Assessment/Plan: Principal Problem:   Acute on chronic respiratory failure with hypoxia (HCC) Active Problems:   Essential hypertension   ESRD on dialysis ([place])   Acute on chronic diastolic CHF (congestive heart failure) (HCC)   Acute on chronic respiratory failure (HCC)   Volume overload/pulmonary edema ESRD on HD M/W/F Chest x-ray with volume overload Nephrology consulted, HD on 01/12/2021, plan for HD on 01/14/21 Daily renal panel  Possible syncope Found down at home Unknown etiology CT head with no acute intracranial pathology CT cervical spine with no fracture EKG with no acute ST changes ECHO showed EF of 60-65%, no RWMA, Grade 1DD Telemetry PT/OT  Elevated troponin Likely demand ischemia due to volume overload and pulmonary hypertension Had a normal cardiac cath on  08/2020 at [facility] Cardiology consulted, appreciate recs  LUE edema Notable swelling in left hand, cool to touch LUE x-ray with no fracture or dislocation, noted some soft tissue swelling with air present likely from recent surgery versus infection LUE Doppler negative for DVT, but shows arterial flow concern Vascular surgery consulted for further management, recently revised from radiocephalic to brachiocephalic fistula by Dr. Trula Slade on 01/10/2021, now with concern for central venous stenosis, plan to repeat his fistulogram on 01/15/21 Elevated extremity, pain management  Acute on chronic hypoxic respiratory failure Likely 2/2 volume overload Management as above  Chronic diastolic HF Fluid management via dialysis  Hypertension BP now stable Continue amlodipine, hydralazine, metoprolol  Anemia of chronic kidney disease Hemoglobin around baseline Daily CBC   Estimated body mass index is 20.31 kg/m as calculated from the following:   Height as of this encounter: 5\' 8"  (1.727 m).   Weight as of this encounter: 60.6 kg.     Code Status: Full  Family Communication: None at bedside  Disposition Plan: Status is: Inpatient   The patient will require care spanning > 2 midnights and should be moved to inpatient because: Inpatient level of care appropriate due to severity of illness  Dispo: The patient is from: Home              Anticipated d/c is to: Home              Patient currently is not medically stable to d/c.   Difficult to place patient No    Consultants:  Nephrology  Vascular  surgery  Cardiology   Procedures:  None  Antimicrobials:  None  DVT prophylaxis: Heparin   Objective: Vitals:   01/14/21 0218 01/14/21 0506 01/14/21 0925 01/14/21 0927  BP:  112/63    Pulse: 69 (!) 48    Resp: 20 18    Temp:  98.6 F (37 C)    TempSrc:      SpO2: 96% 98% (!) 85% 95%  Weight:      Height:        Intake/Output Summary (Last 24 hours) at 01/14/2021  1624 Last data filed at 01/14/2021 1400 Gross per 24 hour  Intake 900 ml  Output 0 ml  Net 900 ml   Filed Weights   01/12/21 1702 01/12/21 2127  Weight: 59 kg 60.6 kg    Exam:  General: NAD   Cardiovascular: S1, S2 present  Respiratory: CTAB  Abdomen: Soft, nontender, nondistended, bowel sounds present  Musculoskeletal: LUE swelling, especially around hand, feels cool to touch, no bilateral pedal edema noted  Skin: Normal  Psychiatry: Normal mood   Data Reviewed: CBC: Recent Labs  Lab 01/12/21 1658 01/12/21 1704 01/12/21 1744 01/13/21 0459 01/14/21 0627  WBC 6.5  --   --  4.6 4.1  NEUTROABS 5.4  --   --   --  3.0  HGB 9.5* 9.5* 9.9* 9.7* 9.7*  HCT 29.9* 28.0* 29.0* 30.4* 30.6*  MCV 111.6*  --   --  108.2* 110.5*  PLT 183  --   --  165 643   Basic Metabolic Panel: Recent Labs  Lab 01/10/21 0657 01/12/21 1658 01/12/21 1704 01/12/21 1744 01/13/21 0459 01/14/21 0627  NA 138 137 134* 135 134* 135  K 4.7 5.1 4.8 4.8 3.4* 4.4  CL 105 98 98  --  92* 94*  CO2  --  25  --   --  32 31  GLUCOSE 84 88 85  --  88 90  BUN 61* 58* 59*  --  14 29*  CREATININE 9.30* 8.63* 8.60*  --  3.45* 6.19*  CALCIUM  --  9.6  --   --  9.6 9.1  PHOS  --   --   --   --   --  6.5*   GFR: Estimated Creatinine Clearance: 10.7 mL/min (A) (by C-G formula based on SCr of 6.19 mg/dL (H)). Liver Function Tests: Recent Labs  Lab 01/12/21 1658 01/14/21 0627  AST 19  --   ALT <5  --   ALKPHOS 71  --   BILITOT 1.4*  --   PROT 6.4*  --   ALBUMIN 3.5 3.1*   No results for input(s): LIPASE, AMYLASE in the last 168 hours. No results for input(s): AMMONIA in the last 168 hours. Coagulation Profile: Recent Labs  Lab 01/12/21 1658  INR 1.2   Cardiac Enzymes: Recent Labs  Lab 01/12/21 1658  CKTOTAL 42*   BNP (last 3 results) No results for input(s): PROBNP in the last 8760 hours. HbA1C: No results for input(s): HGBA1C in the last 72 hours. CBG: Recent Labs  Lab  01/10/21 1039 01/12/21 1847  GLUCAP 211* 88   Lipid Profile: No results for input(s): CHOL, HDL, LDLCALC, TRIG, CHOLHDL, LDLDIRECT in the last 72 hours. Thyroid Function Tests: No results for input(s): TSH, T4TOTAL, FREET4, T3FREE, THYROIDAB in the last 72 hours. Anemia Panel: No results for input(s): VITAMINB12, FOLATE, FERRITIN, TIBC, IRON, RETICCTPCT in the last 72 hours. Urine analysis:    Component Value Date/Time   COLORURINE YELLOW 05/24/2017  [place] (A) 05/24/2017 1729   LABSPEC 1.011 05/24/2017 1729   PHURINE 5.0 05/24/2017 1729   GLUCOSEU 50 (A) 05/24/2017 1729   HGBUR SMALL (A) 05/24/2017 1729   BILIRUBINUR NEGATIVE 05/24/2017 1729   KETONESUR NEGATIVE 05/24/2017 1729   PROTEINUR 100 (A) 05/24/2017 1729   UROBILINOGEN 0.2 04/09/2015 1527   NITRITE NEGATIVE 05/24/2017 1729   LEUKOCYTESUR TRACE (A) 05/24/2017 1729   Sepsis Labs: @LABRCNTIP (procalcitonin:4,lacticidven:4)  ) Recent Results (from the past 240 hour(s))  SARS CORONAVIRUS 2 (TAT 6-24 HRS) Nasopharyngeal Nasopharyngeal Swab     Status: None   Collection Time: 01/08/21  1:32 PM   Specimen: Nasopharyngeal Swab  Result Value Ref Range Status   SARS Coronavirus 2 NEGATIVE NEGATIVE Final    Comment: (NOTE) SARS-CoV-2 target nucleic acids are NOT DETECTED.  The SARS-CoV-2 RNA is generally detectable in upper and lower respiratory specimens during the acute phase of infection. Negative results do not preclude SARS-CoV-2 infection, do not rule out co-infections with other pathogens, and should not be used as the sole basis for treatment or other patient management decisions. Negative results must be combined with clinical observations, patient history, and epidemiological information. The expected result is Negative.  Fact Sheet for Patients: SugarRoll.be  Fact Sheet for Healthcare Providers: https://www.woods-mathews.com/  This test is not yet  approved or cleared by the [place] FDA and  has been authorized for detection and/or diagnosis of SARS-CoV-2 by FDA under an Emergency Use Authorization (EUA). This EUA will remain  in effect (meaning this test can be used) for the duration of the COVID-19 declaration under Se ction 564(b)(1) of the Act, 21 U.S.C. section 360bbb-3(b)(1), unless the authorization is terminated or revoked sooner.  Performed at [facility], [place] [address] [place], [place] [address]   Resp Panel by RT-PCR (Flu A&B, Covid) Nasopharyngeal Swab     Status: None   Collection Time: 01/12/21  5:49 PM   Specimen: Nasopharyngeal Swab; Nasopharyngeal(NP) swabs in vial transport medium  Result Value Ref Range Status   SARS Coronavirus 2 by RT PCR NEGATIVE NEGATIVE Final    Comment: (NOTE) SARS-CoV-2 target nucleic acids are NOT DETECTED.  The SARS-CoV-2 RNA is generally detectable in upper respiratory specimens during the acute phase of infection. The lowest concentration of SARS-CoV-2 viral copies this assay can detect is 138 copies/mL. A negative result does not preclude SARS-Cov-2 infection and should not be used as the sole basis for treatment or other patient management decisions. A negative result may occur with  improper specimen collection/handling, submission of specimen other than nasopharyngeal swab, presence of viral mutation(s) within the areas targeted by this assay, and inadequate number of viral copies(<138 copies/mL). A negative result must be combined with clinical observations, patient history, and epidemiological information. The expected result is Negative.  Fact Sheet for Patients:  EntrepreneurPulse.com.au  Fact Sheet for Healthcare Providers:  IncredibleEmployment.be  This test is no t yet approved or cleared by the [place] FDA and  has been authorized for detection and/or diagnosis of SARS-CoV-2 by FDA under an Emergency Use  Authorization (EUA). This EUA will remain  in effect (meaning this test can be used) for the duration of the COVID-19 declaration under Section 564(b)(1) of the Act, 21 U.S.C.section 360bbb-3(b)(1), unless the authorization is terminated  or revoked sooner.       Influenza A by PCR NEGATIVE NEGATIVE Final   Influenza B by PCR NEGATIVE NEGATIVE Final    Comment: (NOTE) The Xpert  Xpress SARS-CoV-2/FLU/RSV plus assay is intended as an aid in the diagnosis of influenza from Nasopharyngeal swab specimens and should not be used as a sole basis for treatment. Nasal washings and aspirates are unacceptable for Xpert Xpress SARS-CoV-2/FLU/RSV testing.  Fact Sheet for Patients: EntrepreneurPulse.com.au  Fact Sheet for Healthcare Providers: IncredibleEmployment.be  This test is not yet approved or cleared by the [place] FDA and has been authorized for detection and/or diagnosis of SARS-CoV-2 by FDA under an Emergency Use Authorization (EUA). This EUA will remain in effect (meaning this test can be used) for the duration of the COVID-19 declaration under Section 564(b)(1) of the Act, 21 U.S.C. section 360bbb-3(b)(1), unless the authorization is terminated or revoked.  Performed at [facility], [place] [address], [place], [place] [address]   MRSA PCR Screening     Status: None   Collection Time: 01/13/21  7:10 AM   Specimen: Nasal Mucosa; Nasopharyngeal  Result Value Ref Range Status   MRSA by PCR NEGATIVE NEGATIVE Final    Comment:        The GeneXpert MRSA Assay (FDA approved for NASAL specimens only), is one component of a comprehensive MRSA colonization surveillance program. It is not intended to diagnose MRSA infection nor to guide or monitor treatment for MRSA infections. Performed at [facility], [place] [address] [place], [place] [address]       Studies: DG CHEST PORT 1 VIEW  Result Date: 01/14/2021 CLINICAL DATA:   Follow-up EXAM: PORTABLE CHEST - 1 VIEW COMPARISON:  01/12/2021 FINDINGS: The mediastinal contours are within normal limits. Unchanged moderate cardiomegaly. Improved aeration of the lungs bilaterally with mild persistent scattered peribronchovascular patchy opacities. No significant pleural effusion or pneumothorax. Atherosclerotic calcification of the aortic arch. Multilevel cervicothoracic anterior fusion hardware remains in place without complicating feature. No acute osseous abnormality. IMPRESSION: 1. Improved aeration bilaterally compatible with improving mild pulmonary edema. 2. Stable moderate cardiomegaly. 3.  Aortic Atherosclerosis (ICD10-I70.0). Electronically Signed   By: Ruthann Cancer MD   On: 01/14/2021 08:23    Scheduled Meds: . amLODipine  10 mg Oral Daily  . [START ON 01/15/2021] Chlorhexidine Gluconate Cloth  6 each Topical Q0600  . cholecalciferol  1,000 Units Oral Daily  . cinacalcet  60 mg Oral Q breakfast  . [START ON 01/16/2021] doxercalciferol  1 mcg Intravenous Q M,W,F-HD  . ferric citrate  420 mg Oral TID WC  . fluticasone furoate-vilanterol  1 puff Inhalation Daily  . heparin  5,000 Units Subcutaneous Q8H  . hydrALAZINE  50 mg Oral BID  . mouth rinse  15 mL Mouth Rinse BID  . metoprolol tartrate  50 mg Oral BID  . multivitamin  1 tablet Oral Daily  . sevelamer carbonate  800 mg Oral Q breakfast    Continuous Infusions:   LOS: 1 day     Alma Friendly, MD [facility]  If 7PM-7AM, please contact night-coverage [email or web address] 01/14/2021, 4:24 PM

## 2021-01-14 NOTE — H&P (View-Only) (Signed)
VASCULAR SURGERY ASSESSMENT & PLAN:   ESRD: Chronic left hand and forearm pain and edema. POD 4 status post ligation existing radiocephalic fistula and creation of brachiocephalic fistula by Dr. Trula Slade.  He appears to have improvement in hand edema from my exam in the clinic on 12/27/2020. No evidence of steal syndrome.  I replaced his Ace wrap to hand and forearm and elevated his left arm on 2 pillows.  Skin tear left forearm: No bleeding or drainage today.  Continue local wound care.   SUBJECTIVE:   Down-trodden due to chronicity of left forearm and hand pain and edema.  He states his skin tear bled overnight.  Underwent HD via the cephalic fistula Saturday evening without apparent complications.  PHYSICAL EXAM:   Vitals:   01/13/21 1959 01/14/21 0214 01/14/21 0218 01/14/21 0506  BP: 121/63   112/63  Pulse: 75 75 69 (!) 48  Resp: 19 20 20 18   Temp: 98.6 F (37 C)   98.6 F (37 C)  TempSrc: Oral     SpO2: 95% (S) (!) 85% 96% 98%  Weight:      Height:       General appearance: Awake, alert in no apparent distress Cardiac: Heart rate and rhythm are regular Respirations: Nonlabored Incisions: Left upper extremity incisions are all well approximated without bleeding or hematoma Extremities: Moderate left hand and forearm edema. 2+ left radial pulse.  4/5 hand grip strength.  Sensory and motor function intact.  Good bruit and thrill in fistula.  Skin tear inspected.  Currently without bleeding or drainage.  No evidence of cellulitis.     LABS:   Lab Results  Component Value Date   WBC 4.1 01/14/2021   HGB 9.7 (L) 01/14/2021   HCT 30.6 (L) 01/14/2021   MCV 110.5 (H) 01/14/2021   PLT 180 01/14/2021   Lab Results  Component Value Date   CREATININE 6.19 (H) 01/14/2021   Lab Results  Component Value Date   INR 1.2 01/12/2021   CBG (last 3)  Recent Labs    01/12/21 1847  GLUCAP 88    PROBLEM LIST:    Principal Problem:   Acute on chronic respiratory failure  with hypoxia (HCC) Active Problems:   Essential hypertension   ESRD on dialysis ([place])   Acute on chronic diastolic CHF (congestive heart failure) (HCC)   Acute on chronic respiratory failure (HCC)   CURRENT MEDS:   . amLODipine  10 mg Oral Daily  . Chlorhexidine Gluconate Cloth  6 each Topical Q0600  . cholecalciferol  1,000 Units Oral Daily  . cinacalcet  60 mg Oral Q breakfast  . ferric citrate  420 mg Oral TID WC  . fluticasone furoate-vilanterol  1 puff Inhalation Daily  . heparin  5,000 Units Subcutaneous Q8H  . hydrALAZINE  50 mg Oral BID  . mouth rinse  15 mL Mouth Rinse BID  . metoprolol tartrate  100 mg Oral BID  . multivitamin  1 tablet Oral Daily  . sevelamer carbonate  800 mg Oral Q breakfast    Barbie Banner, [place] Office: [phone] 01/14/2021    I agree with the above.  I have seen and evaluated the patient.  He is status post left brachiocephalic fistula and ligation of the left radiocephalic fistula.  Prior to his operation, his edema was mainly in his hand.  He now appears to have worsening edema in the upper arm as well.  This is certainly concerning for central venous stenosis which was  not visualized at his preoperative fistulogram.  Therefore think we need to repeat his fistulogram.  I will do this tomorrow.  He will be n.p.o. after midnight.  [name]

## 2021-01-14 NOTE — Progress Notes (Addendum)
VASCULAR SURGERY ASSESSMENT & PLAN:   ESRD: Chronic left hand and forearm pain and edema. POD 4 status post ligation existing radiocephalic fistula and creation of brachiocephalic fistula by Dr. Trula Slade.  He appears to have improvement in hand edema from my exam in the clinic on 12/27/2020. No evidence of steal syndrome.  I replaced his Ace wrap to hand and forearm and elevated his left arm on 2 pillows.  Skin tear left forearm: No bleeding or drainage today.  Continue local wound care.   SUBJECTIVE:   Down-trodden due to chronicity of left forearm and hand pain and edema.  He states his skin tear bled overnight.  Underwent HD via the cephalic fistula Saturday evening without apparent complications.  PHYSICAL EXAM:   Vitals:   01/13/21 1959 01/14/21 0214 01/14/21 0218 01/14/21 0506  BP: 121/63   112/63  Pulse: 75 75 69 (!) 48  Resp: 19 20 20 18   Temp: 98.6 F (37 C)   98.6 F (37 C)  TempSrc: Oral     SpO2: 95% (S) (!) 85% 96% 98%  Weight:      Height:       General appearance: Awake, alert in no apparent distress Cardiac: Heart rate and rhythm are regular Respirations: Nonlabored Incisions: Left upper extremity incisions are all well approximated without bleeding or hematoma Extremities: Moderate left hand and forearm edema. 2+ left radial pulse.  4/5 hand grip strength.  Sensory and motor function intact.  Good bruit and thrill in fistula.  Skin tear inspected.  Currently without bleeding or drainage.  No evidence of cellulitis.     LABS:   Lab Results  Component Value Date   WBC 4.1 01/14/2021   HGB 9.7 (L) 01/14/2021   HCT 30.6 (L) 01/14/2021   MCV 110.5 (H) 01/14/2021   PLT 180 01/14/2021   Lab Results  Component Value Date   CREATININE 6.19 (H) 01/14/2021   Lab Results  Component Value Date   INR 1.2 01/12/2021   CBG (last 3)  Recent Labs    01/12/21 1847  GLUCAP 88    PROBLEM LIST:    Principal Problem:   Acute on chronic respiratory failure  with hypoxia (HCC) Active Problems:   Essential hypertension   ESRD on dialysis ([place])   Acute on chronic diastolic CHF (congestive heart failure) (HCC)   Acute on chronic respiratory failure (HCC)   CURRENT MEDS:   . amLODipine  10 mg Oral Daily  . Chlorhexidine Gluconate Cloth  6 each Topical Q0600  . cholecalciferol  1,000 Units Oral Daily  . cinacalcet  60 mg Oral Q breakfast  . ferric citrate  420 mg Oral TID WC  . fluticasone furoate-vilanterol  1 puff Inhalation Daily  . heparin  5,000 Units Subcutaneous Q8H  . hydrALAZINE  50 mg Oral BID  . mouth rinse  15 mL Mouth Rinse BID  . metoprolol tartrate  100 mg Oral BID  . multivitamin  1 tablet Oral Daily  . sevelamer carbonate  800 mg Oral Q breakfast    Barbie Banner, [place] Office: [phone] 01/14/2021    I agree with the above.  I have seen and evaluated the patient.  He is status post left brachiocephalic fistula and ligation of the left radiocephalic fistula.  Prior to his operation, his edema was mainly in his hand.  He now appears to have worsening edema in the upper arm as well.  This is certainly concerning for central venous stenosis which was  not visualized at his preoperative fistulogram.  Therefore think we need to repeat his fistulogram.  I will do this tomorrow.  He will be n.p.o. after midnight.  [name]

## 2021-01-14 NOTE — Progress Notes (Signed)
   01/14/21 0214  Oxygen Therapy  SpO2 (!) (S)  85 % (unable to wean)  O2 Device Room [employer]

## 2021-01-14 NOTE — Consult Note (Signed)
Cardiology Consultation:   Patient ID: Thomas Robinson MRN: [id number]; DOB: 1961-02-13  Admit date: 01/12/2021 Date of Consult: 01/14/2021  PCP:  [name], PA   [facility] Providers Cardiologist:  [name], DO        Patient Profile:   Thomas Robinson is a 60 y.o. male with a hx of ESRD with dialysis T,TH,sat, HTN, asthma, CVA, CAD and diastolic HF, anemia of chronic disease, prox ascending aorta dilatation measuring 4.5 cm 10/20 and same on CTA of chest in Dec 2021, pt has been non compliant.      who is being seen 01/14/2021 for the evaluation of abnormal tropoins at the request of Dr. Horris Latino.  History of Present Illness:   Mr. Chuba with above hx and cardiac cath 08/2020 at [facility], for NSTEMI was found to have normal LM,LAD;LCX;RCA.   Echo 08/28/20 with EF 65-70%, mod concentric LVH, mild AS, mild AR, mild TR, severe pulmonary HTN. Ascending aorta diameter 4.1 cm.  Trivial pericardial effusion.  Pt had not had dialysis for 2 weeks.  Troponin at that time 1308 and 2428.  (CTA of chest in 05/2020 with coronary artery calcifications. Other hx of chronic hypoxic respiratory failure supposed to be on home 02.  Hx of sats in low 80s on RA.  Pt was admitted after finding down on ground on afternoon 01/12/21.   Pt admitted 01/12/21.  Having pain and swelling to L arm for months.  Dr. Trula Slade did a ligation of his left arm radiocephalic fistula and converted it to a brachiocephalic AVG. Pt c/o pain to the left arm and there is a skin tear to his left arm.  sp02 was low and need 5L 02 in ER,  CXR with pulmonary vascular congestion.    Pt has chronic Lt hand and forearm pain and edema.  POD 4 status post ligation existing radiocephalic fistula and creation of brachiocephalic fistula by Dr. Trula Slade  Now with increased edema in upper arm.  Concern for central venous stenosis, plans for fistulogram tomorrow.   Unsure why troponin is elevated -drawn due to lt shoulder pain.  None drawn on  admit after being down.   His troponin today is 419, 408 and seems to be elevated most of time.  The peak was with cardiac cath in 08/2020 of 2400.    EKG:  The EKG was personally reviewed and demonstrates:  SR with no acute ST changes. Telemetry:  Telemetry was personally reviewed and demonstrates:  SR to SB   Hgb 9.7 WBC 4.1 K+ 4.4 Cr 6.19  Echo yesterday EF 60-65%, no RWMA, moderate concentric LVH. G1DD   Right ventricular systolic function is mildly reduced. The right  ventricular size is mildly enlarged. There is severely elevated pulmonary artery systolic pressure. The estimated right ventricular systolic pressure is 08.1 mmHg, trivial pericardial effusion.  Mild to mod AS, aortic dilatation at 41 mm no change     Past Medical History:  Diagnosis Date  . Anaphylactic shock, unspecified, sequela 06/10/2019  . Arthritis   . Asthma, chronic, unspecified asthma severity, with acute exacerbation 10/23/2017  . CAP (community acquired pneumonia) 10/23/2017  . Carpal tunnel syndrome of right wrist 10/05/2018  . Cataract    right - removed by surgery  . Cerebral infarction due to thrombosis of cerebral artery ([place])   . Cervical disc herniation 01/04/2020  . Chronic diastolic heart failure ([place]) 04/13/2018  . Chronic low back pain 11/24/2019  . Constipation   . Cough  chronic cough  . Encounter for immunization 07/08/2017  . ESRD on hemodialysis ([place]) 02/16/2018   Dialysis  T Thus Sat  - [facility]   . Fall 06/04/2019  . GERD (gastroesophageal reflux disease) 06/04/2019  . GI bleeding 05/24/2017  . Gram-negative sepsis, unspecified ([place]) 09/05/2017  . History of fusion of cervical spine 03/26/2020  . Hyperlipidemia   . Hypertension   . Hypokalemia 06/04/2017  . Iron deficiency anemia, unspecified 09/09/2017  . Left shoulder pain 11/11/2019  . Lumbar radiculopathy 11/24/2019  . Lung contusion 06/04/2019  . LVH (left ventricular hypertrophy) due to hypertensive disease, with heart  failure ([place]) 06/18/2020  . Macrocytic anemia 05/24/2017  . Moderate protein-calorie malnutrition ([place]) 06/06/2017  . Myofascial pain syndrome 06/12/2020  . Neuritis of right ulnar nerve 09/13/2018  . Non-compliance with renal dialysis ([place]) 02/08/2020  . Oxygen deficiency 12/30/2019   O2 sats on RA 87% at PAT appt   . Pain in joint of right elbow 09/13/2018  . Pulmonary hypertension ([place])   . Renovascular hypertension 06/22/2020  . Restless leg syndrome   . Rib fractures 06/2019   Right  . S/P cardiac cath 08/27/2020   normal coronary arteries.  . Secondary hyperparathyroidism of renal origin ([place]) 06/04/2017  . Thoracic ascending aortic aneurysm ([facility])    4.5 cm 06/04/19 CT  . Ulnar neuropathy 10/05/2018  . Volume overload 10/23/2017    Past Surgical History:  Procedure Laterality Date  . A/V FISTULAGRAM N/A 01/01/2021   Procedure: A/V FISTULAGRAM;  Surgeon: Serafina Mitchell, MD;  Location: [place] [facility];  Service: Cardiovascular;  Laterality: N/A;  . ANTERIOR CERVICAL DECOMP/DISCECTOMY FUSION N/A 01/04/2020   Procedure: ANTERIOR CERVICAL DECOMPRESSION/DISCECTOMY FUSION CERVICAL FIVE THROUGH SEVEN;  Surgeon: Melina Schools, MD;  Location: [place];  Service: Orthopedics;  Laterality: N/A;  3 hrs  . AV FISTULA PLACEMENT Left 05/28/2017   Procedure: LEFT ARM ARTERIOVENOUS (AV) FISTULA CREATION;  Surgeon: Conrad Lockhart, MD;  Location: [place];  Service: Vascular;  Laterality: Left;  . EYE SURGERY Right 06/02/2019   Cataract removed  . FRACTURE SURGERY    . IR DIALY SHUNT INTRO NEEDLE/INTRACATH INITIAL W/IMG LEFT Left 09/12/2020  . IR FLUORO GUIDE CV LINE RIGHT  05/25/2017  . IR US GUIDE VASC ACCESS LEFT  09/12/2020  . IR US GUIDE VASC ACCESS RIGHT  05/25/2017  . LIGATION OF ARTERIOVENOUS  FISTULA Left 01/10/2021   Procedure: LIGATION OF LEFT ARM RADIOCEPHALIC FISTULA;  Surgeon: Serafina Mitchell, MD;  Location: [place];  Service: Vascular;  Laterality: Left;  . REVISON OF ARTERIOVENOUS FISTULA Left  07/18/2019   Procedure: REVISION PLICATION OF RADIOCEPHALIC ARTERIOVENOUS FISTULA LEFT ARM;  Surgeon: Angelia Mould, MD;  Location: [place];  Service: Vascular;  Laterality: Left;  . REVISON OF ARTERIOVENOUS FISTULA Left 01/10/2021   Procedure: CONVERSION TO BRACHIOCEPHALIC ARTERIOVENOUS FISTULA;  Surgeon: Serafina Mitchell, MD;  Location: [place];  Service: Vascular;  Laterality: Left;  . RINOPLASTY       Home Medications:  Prior to Admission medications   Medication Sig Start Date End Date Taking? Authorizing Provider  acetaminophen (TYLENOL) 500 MG tablet Take 500 mg by mouth every 6 (six) hours as needed for moderate pain or headache.   Yes [provider]  albuterol (VENTOLIN HFA) 108 (90 Base) MCG/ACT inhaler Inhale 2 puffs into the lungs every 6 (six) hours as needed for wheezing or shortness of breath.    Yes [provider]  amLODipine (NORVASC) 10  MG tablet Take 10 mg by mouth daily.    Yes [provider]  AURYXIA 1 GM 210 MG(Fe) tablet Take 420 mg by mouth 3 (three) times daily with meals.  03/16/20  Yes [provider]  BREO ELLIPTA 100-25 MCG/INH AEPB Inhale 1 puff into the lungs daily. 06/26/20  Yes [provider]  cholecalciferol (VITAMIN D3) 25 MCG (1000 UNIT) tablet Take 1,000 Units by mouth daily.   Yes [provider]  cinacalcet (SENSIPAR) 60 MG tablet Take 60 mg by mouth daily.   Yes [provider]  DIPHENHYDRAMINE HCL PO Take 25 mg by mouth as needed (allergies). 09/24/20 09/23/21 Yes [provider]  doxercalciferol (HECTOROL) 0.5 MCG capsule Doxercalciferol (Hectorol) 11/26/20 11/25/21 Yes [provider]  furosemide (LASIX) 40 MG tablet Take 1 tablet (40 mg total) by mouth See admin instructions. On Sunday, Monday, Wednesday, Thursday and Saturday Patient taking differently: Take 40 mg by mouth daily as needed for fluid. 08/14/20  Yes Tobb, Kardie, DO  gabapentin (NEURONTIN) 300 MG capsule  Take 300 mg by mouth 2 (two) times daily as needed (pain).    Yes [provider]  hydrALAZINE (APRESOLINE) 25 MG tablet Take 50 mg by mouth in the morning and at bedtime.   Yes [provider]  metoprolol tartrate (LOPRESSOR) 100 MG tablet Take 100 mg by mouth 2 (two) times daily.    Yes [provider]  multivitamin (RENA-VIT) TABS tablet Take 1 tablet by mouth daily. 01/12/18  Yes [provider]  pregabalin (LYRICA) 25 MG capsule Take 25 mg by mouth daily as needed (pain). 11/29/20  Yes [provider]  sevelamer carbonate (RENVELA) 800 MG tablet Take 800 mg by mouth daily. 11/07/19  Yes [provider]  Specialty Vitamins Products (HEALTHY HEART COMPLEX PO) Take 1 tablet by mouth daily.   Yes [provider]  traMADol (ULTRAM) 50 MG tablet Take 1 tablet (50 mg total) by mouth every 8 (eight) hours as needed. Patient taking differently: Take 50 mg by mouth every 8 (eight) hours as needed for moderate pain. 01/10/21  Yes Rhyne, Hulen Shouts, PA-C  Methoxy PEG-Epoetin Beta (MIRCERA IJ) Mircera 10/01/20 09/30/21  [provider]    Inpatient Medications: Scheduled Meds: . amLODipine  10 mg Oral Daily  . [START ON 01/15/2021] Chlorhexidine Gluconate Cloth  6 each Topical Q0600  . cholecalciferol  1,000 Units Oral Daily  . cinacalcet  60 mg Oral Q breakfast  . [START ON 01/16/2021] doxercalciferol  1 mcg Intravenous Q M,W,F-HD  . ferric citrate  420 mg Oral TID WC  . fluticasone furoate-vilanterol  1 puff Inhalation Daily  . heparin  5,000 Units Subcutaneous Q8H  . hydrALAZINE  50 mg Oral BID  . mouth rinse  15 mL Mouth Rinse BID  . metoprolol tartrate  100 mg Oral BID  . multivitamin  1 tablet Oral Daily  . sevelamer carbonate  800 mg Oral Q breakfast   Continuous Infusions:  PRN Meds: acetaminophen **OR** acetaminophen, albuterol, gabapentin, HYDROcodone-acetaminophen, ondansetron **OR** ondansetron (ZOFRAN) IV, oxyCODONE,  pregabalin, traMADol  Allergies:   No Known Allergies  Social History:   Social History   Socioeconomic History  . Marital status: Single    Spouse name: Not on file  . Number of children: Not on file  . Years of education: Not on file  . Highest education level: Not on file  Occupational History  . Not on file  Tobacco Use  . Smoking status: Never  Smoker  . Smokeless tobacco: Never Used  Vaping Use  . Vaping Use: Never used  Substance and Sexual Activity  . Alcohol use: Not Currently    Alcohol/week: 0.0 standard drinks    Comment: has a drink once in a while- 12/30/19  . Drug use: No  . Sexual activity: Not Currently  Other Topics Concern  . Not on file  Social History Narrative   Lives with a roommate in a one story home.  Has 1 child.  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  Education: high school.   Social Determinants of Health   Financial Resource Strain: Not on file  Food Insecurity: Not on file  Transportation Needs: Not on file  Physical Activity: Not on file  Stress: Not on file  Social Connections: Not on file  Intimate Partner Violence: Not on file    Family History:    Family History  Problem Relation Age of Onset  . Cancer Mother      ROS:  Please see the history of present illness.  General:no colds or fevers, no weight changes Skin:no rashes or ulcers HEENT:no blurred vision, no congestion CV:see HPI PUL:see HPI, hypoxia, does not always wear 02 GI:no diarrhea constipation or melena, no indigestion GU:no hematuria, no dysuria MS:no joint pain, no claudication Neuro:no syncope, no lightheadedness Endo:no diabetes, no thyroid disease  All other ROS reviewed and negative.     Physical Exam/Data:   Vitals:   01/14/21 0218 01/14/21 0506 01/14/21 0925 01/14/21 0927  BP:  112/63    Pulse: 69 (!) 48    Resp: 20 18    Temp:  98.6 F (37 C)    TempSrc:      SpO2: 96% 98% (!) 85% 95%  Weight:      Height:        Intake/Output Summary  (Last 24 hours) at 01/14/2021 1542 Last data filed at 01/14/2021 1400 Gross per 24 hour  Intake 900 ml  Output 0 ml  Net 900 ml   Last 3 Weights 01/12/2021 01/12/2021 01/10/2021  Weight (lbs) 133 lb 9.6 oz 130 lb 1.1 oz 130 lb  Weight (kg) 60.6 kg 59 kg 58.968 kg     Body mass index is 20.31 kg/m.   Per Dr. Oval Linsey  General:  Well nourished, well developed, in no acute distress HEENT: normal Lymph: no adenopathy Neck: no JVD Endocrine:  No thryomegaly Vascular: No carotid bruits; FA pulses 2+ bilaterally without bruits  Cardiac:  normal S1, S2; RRR; no murmur  Lungs:  clear to auscultation bilaterally, no wheezing, rhonchi or rales  Abd: soft, nontender, no hepatomegaly  Ext: + lt upper ext edema Musculoskeletal:  No deformities, BUE and BLE strength normal and equal Skin: warm and dry  Neuro:  Alert and oriented X 3, no focal abnormalities noted Psych:  Normal affect   Relevant CV Studies: Cardiac cath 08/27/20 at [facility]  This demonstrated the following findings:  LM: Normal  LAD: Normal  Lcx: Normal  RCA: Normal   At this time, all equipment was removed without complications and a  transradial band wasused to obtain hemostasis. No immediate  complications were noted. The patient was safely transferred to the  hospital/ICU bed. Total contrast used was 54 mL.   The Cath Lab staff, patient, family, referring physician and all involved  care team members were briefed in detail regarding the proceedings in the  Cath Lab. All questions were answered.   Impression:  Normal coronary arteries  Normal left ventricular end-diastolic pressure   Plan:  1. Secondary prevention of coronary artery disease  2. Medical management    Echo 01/13/21 FINDINGS  Left Ventricle: Left ventricular ejection fraction, by estimation, is 60  to 65%. The left ventricle has normal function. The left ventricle has no  regional wall motion abnormalities. The left ventricular internal  cavity  size was small. There is moderate  concentric left ventricular hypertrophy. Left ventricular diastolic  parameters are consistent with Grade I diastolic dysfunction (impaired  relaxation).   Right Ventricle: The right ventricular size is mildly enlarged. Right  vetricular wall thickness was not assessed. Right ventricular systolic  function is mildly reduced. There is severely elevated pulmonary artery  systolic pressure. The tricuspid  regurgitant velocity is 3.87 m/s, and with an assumed right atrial  pressure of 15 mmHg, the estimated right ventricular systolic pressure is  06.3 mmHg.   Left Atrium: Left atrial size was normal in size.   Right Atrium: Right atrial size was normal in size.   Pericardium: Trivial pericardial effusion is present. The pericardial  effusion is posterior to the left ventricle.   Mitral Valve: The mitral valve is abnormal. There is mild calcification of  the mitral valve leaflet(s). Moderate mitral annular calcification. Mild  mitral valve regurgitation.   Tricuspid Valve: The tricuspid valve is grossly normal. Tricuspid valve  regurgitation is moderate.   Aortic Valve: The aortic valve is tricuspid. There is moderate  calcification of the aortic valve. There is mild aortic valve annular  calcification. Aortic valve regurgitation is mild. Aortic regurgitation  PHT measures 325 msec. Mild to moderate aortic  stenosis is present. Aortic valve mean gradient measures 12.0 mmHg. Aortic  valve peak gradient measures 23.2 mmHg. Aortic valve area, by VTI measures  1.46 cm.   Pulmonic Valve: The pulmonic valve was grossly normal. Pulmonic valve regurgitation is trivial.  Laboratory Data:  High Sensitivity Troponin:   Recent Labs  Lab 01/14/21 0627 01/14/21 0812  TROPONINIHS 408* 419*     Chemistry Recent Labs  Lab 01/12/21 1658 01/12/21 1704 01/12/21 1744 01/13/21 0459 01/14/21 0627  NA 137 134* 135 134* 135  K 5.1 4.8 4.8 3.4* 4.4   CL 98 98  --  92* 94*  CO2 25  --   --  32 31  GLUCOSE 88 85  --  88 90  BUN 58* 59*  --  14 29*  CREATININE 8.63* 8.60*  --  3.45* 6.19*  CALCIUM 9.6  --   --  9.6 9.1  GFRNONAA 6*  --   --  19* 10*  ANIONGAP 14  --   --  10 10    Recent Labs  Lab 01/12/21 1658 01/14/21 0627  PROT 6.4*  --   ALBUMIN 3.5 3.1*  AST 19  --   ALT <5  --   ALKPHOS 71  --   BILITOT 1.4*  --    Hematology Recent Labs  Lab 01/12/21 1658 01/12/21 1704 01/12/21 1744 01/13/21 0459 01/14/21 0627  WBC 6.5  --   --  4.6 4.1  RBC 2.68*  --   --  2.81* 2.77*  HGB 9.5*   < > 9.9* 9.7* 9.7*  HCT 29.9*   < > 29.0* 30.4* 30.6*  MCV 111.6*  --   --  108.2* 110.5*  MCH 35.4*  --   --  34.5* 35.0*  MCHC 31.8  --   --  31.9 31.7  RDW 15.7*  --   --  15.6* 16.1*  PLT 183  --   --  165 180   < > = values in this interval not displayed.   BNPNo results for input(s): BNP, PROBNP in the last 168 hours.  DDimer No results for input(s): DDIMER in the last 168 hours.   Radiology/Studies:  DG Chest 1 View  Result Date: 01/12/2021 CLINICAL DATA:  Pain following fall EXAM: CHEST  1 VIEW COMPARISON:  September 19, 2020 FINDINGS: There is cardiomegaly with pulmonary venous hypertension. The interstitium is mildly prominent without overt pulmonary edema. No appreciable airspace opacity. No adenopathy. No pneumothorax. There is a skin fold on the left. No fracture evident. Postoperative change in lower cervical spine. There is aortic atherosclerosis. IMPRESSION: Suspect underlying chronic bronchitis. No edema or consolidation. There is cardiomegaly with pulmonary vascular congestion. No pneumothorax. Aortic Atherosclerosis (ICD10-I70.0). Electronically Signed   By: Lowella Grip III M.D.   On: 01/12/2021 18:02   DG Pelvis 1-2 Views  Result Date: 01/12/2021 CLINICAL DATA:  Pain following fall EXAM: PELVIS - 1-2 VIEW COMPARISON:  None. FINDINGS: There is no evidence of pelvic fracture or dislocation. Joint spaces  appear normal. Multiple foci of vascular calcification noted bilaterally. IMPRESSION: No fracture or dislocation. No evident arthropathy. Multiple foci of vascular calcification noted. Electronically Signed   By: Lowella Grip III M.D.   On: 01/12/2021 18:07   DG Elbow Complete Left  Result Date: 01/12/2021 CLINICAL DATA:  Pain following fall. Reported recent arm region surgery EXAM: LEFT ELBOW - COMPLETE 3+ VIEW COMPARISON:  None. FINDINGS: Frontal, lateral, and bilateral oblique views were obtained. There is soft tissue swelling. There is no appreciable fracture or dislocation. No joint effusion. No joint space narrowing or erosion. Multiple foci of arterial vascular calcification noted. Note that there is soft tissue air in the volar aspect of the distal humerus. IMPRESSION: No fracture or dislocation. Soft tissue swelling and soft tissue air present. A degree of infection within the soft tissues of the distal upper arm region is questioned. Nearby surgical clips. Multiple foci of arterial vascular calcification noted. Electronically Signed   By: Lowella Grip III M.D.   On: 01/12/2021 18:05   DG Forearm Left  Result Date: 01/12/2021 CLINICAL DATA:  Pain following fall.  Recent fistula surgery EXAM: LEFT FOREARM - 2 VIEW COMPARISON:  None. FINDINGS: Frontal and lateral views were obtained. No acute fracture or dislocation. No appreciable joint space narrowing or erosion. Multiple foci of vascular calcification noted. Soft tissue air is noted volar to the distal upper extremity. IMPRESSION: Foci of soft tissue air may be have infectious etiology or may be secondary to recent surgery. Clinical assessment in this regard warranted. No acute fracture or dislocation. No appreciable joint space narrowing or erosion. Multiple foci of arterial vascular calcification noted. Electronically Signed   By: Lowella Grip III M.D.   On: 01/12/2021 18:06   CT Head Wo Contrast  Result Date:  01/12/2021 CLINICAL DATA:  Fall EXAM: CT HEAD WITHOUT CONTRAST CT CERVICAL SPINE WITHOUT CONTRAST TECHNIQUE: Multidetector CT imaging of the head and cervical spine was performed following the standard protocol without intravenous contrast. Multiplanar CT image reconstructions of the cervical spine were also generated. COMPARISON:  None. FINDINGS: CT HEAD FINDINGS Brain: No evidence of acute infarction, hemorrhage, hydrocephalus, extra-axial collection or mass lesion/mass effect. Periventricular white matter hypodensity. Vascular: No hyperdense vessel or unexpected calcification. Skull: Normal. Negative for fracture or focal lesion. Sinuses/Orbits: No acute finding. Other: None. CT CERVICAL SPINE FINDINGS Alignment: Degenerative  straightening and reversal of the normal cervical lordosis. Skull base and vertebrae: No acute fracture. No primary bone lesion or focal pathologic process. Soft tissues and spinal canal: No prevertebral fluid or swelling. No visible canal hematoma. Disc levels: Status post anterior cervical discectomy and fusion of C5 through C7. Upper chest: Negative. Other: None. IMPRESSION: 1. No acute intracranial pathology. Small-vessel white matter disease. 2. No fracture or static subluxation of the cervical spine. 3. Status post anterior cervical discectomy and fusion of C5 through C7. Electronically Signed   By: Eddie Candle M.D.   On: 01/12/2021 17:50   CT Cervical Spine Wo Contrast  Result Date: 01/12/2021 CLINICAL DATA:  Fall EXAM: CT HEAD WITHOUT CONTRAST CT CERVICAL SPINE WITHOUT CONTRAST TECHNIQUE: Multidetector CT imaging of the head and cervical spine was performed following the standard protocol without intravenous contrast. Multiplanar CT image reconstructions of the cervical spine were also generated. COMPARISON:  None. FINDINGS: CT HEAD FINDINGS Brain: No evidence of acute infarction, hemorrhage, hydrocephalus, extra-axial collection or mass lesion/mass effect. Periventricular white  matter hypodensity. Vascular: No hyperdense vessel or unexpected calcification. Skull: Normal. Negative for fracture or focal lesion. Sinuses/Orbits: No acute finding. Other: None. CT CERVICAL SPINE FINDINGS Alignment: Degenerative straightening and reversal of the normal cervical lordosis. Skull base and vertebrae: No acute fracture. No primary bone lesion or focal pathologic process. Soft tissues and spinal canal: No prevertebral fluid or swelling. No visible canal hematoma. Disc levels: Status post anterior cervical discectomy and fusion of C5 through C7. Upper chest: Negative. Other: None. IMPRESSION: 1. No acute intracranial pathology. Small-vessel white matter disease. 2. No fracture or static subluxation of the cervical spine. 3. Status post anterior cervical discectomy and fusion of C5 through C7. Electronically Signed   By: Eddie Candle M.D.   On: 01/12/2021 17:50   DG CHEST PORT 1 VIEW  Result Date: 01/14/2021 CLINICAL DATA:  Follow-up EXAM: PORTABLE CHEST - 1 VIEW COMPARISON:  01/12/2021 FINDINGS: The mediastinal contours are within normal limits. Unchanged moderate cardiomegaly. Improved aeration of the lungs bilaterally with mild persistent scattered peribronchovascular patchy opacities. No significant pleural effusion or pneumothorax. Atherosclerotic calcification of the aortic arch. Multilevel cervicothoracic anterior fusion hardware remains in place without complicating feature. No acute osseous abnormality. IMPRESSION: 1. Improved aeration bilaterally compatible with improving mild pulmonary edema. 2. Stable moderate cardiomegaly. 3.  Aortic Atherosclerosis (ICD10-I70.0). Electronically Signed   By: Ruthann Cancer MD   On: 01/14/2021 08:23   ECHOCARDIOGRAM COMPLETE  Result Date: 01/13/2021    ECHOCARDIOGRAM REPORT   Patient Name:   TIMONTHY HOVATER Date of Exam: 01/13/2021 Medical Rec #:  [id number]        Height:       68.0 in Accession #:    [id number]       Weight:       133.6 lb Date of  Birth:  May 27, 1961        BSA:          1.722 m Patient Age:    33 years         BP:           118/73 mmHg Patient Gender: M                HR:           78 bpm. Exam Location:  Inpatient Procedure: 2D Echo, 3D Echo, Cardiac Doppler and Color Doppler Indications:    Syncope R55  History:  Patient has prior history of Echocardiogram examinations, most                 recent 05/17/2019. CHF, Pulmonary HTN; Risk Factors:Hypertension                 and Dyslipidemia. End stage renal disease, Macrocytic anemia.                 GERD. Thoracic ascending aortic aneurysm 4.5 by CT.  Sonographer:    Darlina Sicilian RDCS Referring Phys: [id number] [place]  Sonographer Comments: Global longitudinal strain was attempted. IMPRESSIONS  1. Left ventricular ejection fraction, by estimation, is 60 to 65%. The left ventricle has normal function. The left ventricle has no regional wall motion abnormalities. There is moderate concentric left ventricular hypertrophy. Left ventricular diastolic parameters are consistent with Grade I diastolic dysfunction (impaired relaxation).  2. Right ventricular systolic function is mildly reduced. The right ventricular size is mildly enlarged. There is severely elevated pulmonary artery systolic pressure. The estimated right ventricular systolic pressure is 32.2 mmHg.  3. There is a trivial pericardial effusion posterior to the left ventricle.  4. The mitral valve is abnormal. Mild mitral valve regurgitation. Moderate mitral annular calcification.  5. Tricuspid valve regurgitation is moderate.  6. The aortic valve is tricuspid. There is moderate calcification of the aortic valve. Aortic valve regurgitation is mild. Mild to moderate aortic valve stenosis. Aortic valve mean gradient measures 12.0 mmHg. Dimentionless index 0.58.  7. Aortic dilatation noted. There is mild dilatation of the ascending aorta, measuring 41 mm.  8. The inferior vena cava is dilated in size with <50% respiratory  variability, suggesting right atrial pressure of 15 mmHg. FINDINGS  Left Ventricle: Left ventricular ejection fraction, by estimation, is 60 to 65%. The left ventricle has normal function. The left ventricle has no regional wall motion abnormalities. The left ventricular internal cavity size was small. There is moderate  concentric left ventricular hypertrophy. Left ventricular diastolic parameters are consistent with Grade I diastolic dysfunction (impaired relaxation). Right Ventricle: The right ventricular size is mildly enlarged. Right vetricular wall thickness was not assessed. Right ventricular systolic function is mildly reduced. There is severely elevated pulmonary artery systolic pressure. The tricuspid regurgitant velocity is 3.87 m/s, and with an assumed right atrial pressure of 15 mmHg, the estimated right ventricular systolic pressure is 02.5 mmHg. Left Atrium: Left atrial size was normal in size. Right Atrium: Right atrial size was normal in size. Pericardium: Trivial pericardial effusion is present. The pericardial effusion is posterior to the left ventricle. Mitral Valve: The mitral valve is abnormal. There is mild calcification of the mitral valve leaflet(s). Moderate mitral annular calcification. Mild mitral valve regurgitation. Tricuspid Valve: The tricuspid valve is grossly normal. Tricuspid valve regurgitation is moderate. Aortic Valve: The aortic valve is tricuspid. There is moderate calcification of the aortic valve. There is mild aortic valve annular calcification. Aortic valve regurgitation is mild. Aortic regurgitation PHT measures 325 msec. Mild to moderate aortic stenosis is present. Aortic valve mean gradient measures 12.0 mmHg. Aortic valve peak gradient measures 23.2 mmHg. Aortic valve area, by VTI measures 1.46 cm. Pulmonic Valve: The pulmonic valve was grossly normal. Pulmonic valve regurgitation is trivial. Aorta: The aortic root is normal in size and structure and aortic dilatation  noted. There is mild dilatation of the ascending aorta, measuring 41 mm. Venous: The inferior vena cava is dilated in size with less than 50% respiratory variability, suggesting right atrial pressure of 15 mmHg. IAS/Shunts:  No atrial level shunt detected by color flow Doppler.  LEFT VENTRICLE PLAX 2D LVIDd:         3.80 cm     Diastology LVIDs:         2.40 cm     LV e' medial:    3.44 cm/s LV PW:         1.00 cm     LV E/e' medial:  25.4 LV IVS:        1.10 cm     LV e' lateral:   4.26 cm/s LVOT diam:     1.80 cm     LV E/e' lateral: 20.5 LV SV:         54 LV SV Index:   32 LVOT Area:     2.54 cm  LV Volumes (MOD) LV vol d, MOD A2C: 74.7 ml LV vol d, MOD A4C: 67.4 ml LV vol s, MOD A2C: 43.2 ml LV vol s, MOD A4C: 33.1 ml LV SV MOD A2C:     31.5 ml LV SV MOD A4C:     67.4 ml LV SV MOD BP:      34.2 ml RIGHT VENTRICLE RV S prime:     14.90 cm/s TAPSE (M-mode): 2.0 cm LEFT ATRIUM             Index       RIGHT ATRIUM           Index LA diam:        2.80 cm 1.63 cm/m  RA Area:     18.70 cm LA Vol (A2C):   66.2 ml 38.45 ml/m RA Volume:   52.80 ml  30.67 ml/m LA Vol (A4C):   40.7 ml 23.64 ml/m LA Biplane Vol: 57.9 ml 33.63 ml/m  AORTIC VALVE AV Area (Vmax):    1.26 cm AV Area (Vmean):   1.31 cm AV Area (VTI):     1.46 cm AV Vmax:           241.00 cm/s AV Vmean:          156.000 cm/s AV VTI:            0.372 m AV Peak Grad:      23.2 mmHg AV Mean Grad:      12.0 mmHg LVOT Vmax:         119.00 cm/s LVOT Vmean:        80.300 cm/s LVOT VTI:          0.214 m LVOT/AV VTI ratio: 0.58 AI PHT:            325 msec  AORTA Ao Root diam: 3.70 cm Ao Asc diam:  4.10 cm MITRAL VALVE                TRICUSPID VALVE MV Area (PHT): 1.66 cm     TR Peak grad:   59.9 mmHg MV Decel Time: 458 msec     TR Vmax:        387.00 cm/s MV E velocity: 87.40 cm/s MV A velocity: 113.00 cm/s  SHUNTS MV E/A ratio:  0.77         Systemic VTI:  0.21 m                             Systemic Diam: 1.80 cm Rozann Lesches MD Electronically signed by  Rozann Lesches MD Signature Date/Time: 01/13/2021/2:53:42 PM    Final  VAS [place] UPPER EXTREMITY VENOUS DUPLEX  Result Date: 01/13/2021 UPPER VENOUS STUDY  Patient Name:  NIKOLAI WILCZAK  Date of Exam:   01/13/2021 Medical Rec #: [id number]         Accession #:    [id number] Date of Birth: 1961/03/03         Patient Gender: M Patient Age:   59Y Exam Location:  [facility] Procedure:      VAS [place] UPPER EXTREMITY VENOUS DUPLEX Referring Phys: [id number] JARED M GARDNER --------------------------------------------------------------------------------  Indications: Pain, and Swelling Risk Factors: Surgery - Recent ligation of radiocephalic AVF and creation of brachiocephalic AVF Recent fall with skin tear to LUE. Comparison Study: Previous 07/03/20 - negative Performing Technologist: Rogelia Rohrer  Examination Guidelines: A complete evaluation includes B-mode imaging, spectral Doppler, color Doppler, and power Doppler as needed of all accessible portions of each vessel. Bilateral testing is considered an integral part of a complete examination. Limited examinations for reoccurring indications may be performed as noted.  Right Findings: +----------+------------+---------+-----------+----------+-------+ RIGHT     CompressiblePhasicitySpontaneousPropertiesSummary +----------+------------+---------+-----------+----------+-------+ Subclavian    Full       Yes       Yes                      +----------+------------+---------+-----------+----------+-------+  Left Findings: +----------+------------+---------+-----------+----------+-------+ LEFT      CompressiblePhasicitySpontaneousPropertiesSummary +----------+------------+---------+-----------+----------+-------+ IJV           Full       Yes       Yes                      +----------+------------+---------+-----------+----------+-------+ Subclavian    Full       Yes       Yes                       +----------+------------+---------+-----------+----------+-------+ Axillary      Full       Yes       Yes                      +----------+------------+---------+-----------+----------+-------+ Brachial      Full       Yes       Yes                      +----------+------------+---------+-----------+----------+-------+ Radial        Full                                          +----------+------------+---------+-----------+----------+-------+ Ulnar         Full                                          +----------+------------+---------+-----------+----------+-------+ Cephalic      Full                                          +----------+------------+---------+-----------+----------+-------+ Basilic       Full       Yes       Yes                      +----------+------------+---------+-----------+----------+-------+  Patient negative for DVT however there is concern for disfunction of AVF versus arterial disease. Hand is cold to the touch with decreased arterial flow in forearm. would recommend vascular consult and further imaging.  Summary:  Right: No evidence of thrombosis in the subclavian.  Left: No evidence of deep vein thrombosis in the upper extremity. No evidence of superficial vein thrombosis in the upper extremity.  *See table(s) above for measurements and observations.  Vascular consult recommended. Diagnosing physician: Servando Snare MD Electronically signed by Servando Snare MD on 01/13/2021 at 12:06:47 PM.    Final      Assessment and Plan:   1. Elevated troponins with hx of elevated troponins may be due to volume overload and pulmonary HTN.  He had normal cardiac cath 08/2020 at [facility] with troponin>2400.  He is cleared for procedures here at [facility].  2. Hypoxemia/vol overload/pulmonary edema.  Improved with dialysis for dialysis today  Should wear home 02. 3. ESRD on HD per renal 4. Anemia of chronic disease  5. HTN controlled 6. LUE edema, Concern for  central venous stenosis, plans for fistulogram tomorrow.  7. Cardiac cath 08/2020 with normal coronary arteries.  8. Pulmonary hypertension, once dry may be helpful to do echo.to re-eval. 9. Non compliance, discussed importance of dialysis and 02 10. BP soft, and HR in low 50s at times. Will decrease BB to 50 BID   Risk Assessment/Risk Scores:                For questions or updates, please contact [place] Please consult [email or web address] for contact info under    Signed, Cecilie Kicks, NP  01/14/2021 3:42 PM

## 2021-01-15 ENCOUNTER — Encounter (HOSPITAL_COMMUNITY): Payer: Self-pay | Admitting: Surgery

## 2021-01-15 ENCOUNTER — Inpatient Hospital Stay (HOSPITAL_COMMUNITY)
Admission: EM | Disposition: A | Discharge status: Home / Self Care | Payer: Self-pay | Source: Home / Self Care | Attending: Internal Medicine

## 2021-01-15 DIAGNOSIS — T82858A Stenosis of vascular prosthetic devices, implants and grafts, initial encounter: Secondary | ICD-10-CM | POA: Diagnosis not present

## 2021-01-15 DIAGNOSIS — Z992 Dependence on renal dialysis: Secondary | ICD-10-CM | POA: Diagnosis not present

## 2021-01-15 DIAGNOSIS — N186 End stage renal disease: Secondary | ICD-10-CM | POA: Diagnosis not present

## 2021-01-15 HISTORY — PX: PERIPHERAL VASCULAR BALLOON ANGIOPLASTY: CATH118281

## 2021-01-15 HISTORY — PX: A/V FISTULAGRAM: CATH118298

## 2021-01-15 LAB — RENAL FUNCTION PANEL
Albumin: 3.2 g/dL — ABNORMAL LOW (ref 3.5–5.0)
Anion gap: 15 (ref 5–15)
BUN: 43 mg/dL — ABNORMAL HIGH (ref 8–23)
CO2: 27 mmol/L (ref 22–32)
Calcium: 9 mg/dL (ref 8.9–10.3)
Chloride: 93 mmol/L — ABNORMAL LOW (ref 98–111)
Creatinine, Ser: 7.62 mg/dL — ABNORMAL HIGH (ref 0.61–1.24)
GFR, Estimated: 7 mL/min — ABNORMAL LOW (ref >60)
Glucose, Bld: 94 mg/dL (ref 70–99)
Phosphorus: 7.5 mg/dL — ABNORMAL HIGH (ref 2.5–4.6)
Potassium: 4.5 mmol/L (ref 3.5–5.1)
Sodium: 135 mmol/L (ref 135–145)

## 2021-01-15 LAB — CBC WITH DIFFERENTIAL/PLATELET
Abs Immature Granulocytes: 0.02 10*3/uL (ref 0.00–0.07)
Basophils Absolute: 0.1 10*3/uL (ref 0.0–0.1)
Basophils Relative: 1 %
Eosinophils Absolute: 0.2 10*3/uL (ref 0.0–0.5)
Eosinophils Relative: 4 %
HCT: 30.8 % — ABNORMAL LOW (ref 39.0–52.0)
Hemoglobin: 9.8 g/dL — ABNORMAL LOW (ref 13.0–17.0)
Immature Granulocytes: 0 %
Lymphocytes Relative: 12 %
Lymphs Abs: 0.6 10*3/uL — ABNORMAL LOW (ref 0.7–4.0)
MCH: 35.3 pg — ABNORMAL HIGH (ref 26.0–34.0)
MCHC: 31.8 g/dL (ref 30.0–36.0)
MCV: 110.8 fL — ABNORMAL HIGH (ref 80.0–100.0)
Monocytes Absolute: 0.4 10*3/uL (ref 0.1–1.0)
Monocytes Relative: 8 %
Neutro Abs: 3.5 10*3/uL (ref 1.7–7.7)
Neutrophils Relative %: 75 %
Platelets: 181 10*3/uL (ref 150–400)
RBC: 2.78 MIL/uL — ABNORMAL LOW (ref 4.22–5.81)
RDW: 16.1 % — ABNORMAL HIGH (ref 11.5–15.5)
WBC: 4.8 10*3/uL (ref 4.0–10.5)
nRBC: 0 % (ref 0.0–0.2)

## 2021-01-15 SURGERY — A/V FISTULAGRAM
Anesthesia: LOCAL

## 2021-01-15 MED ORDER — HYDROCODONE-ACETAMINOPHEN 5-325 MG PO TABS: 1.0000 | ORAL_TABLET | Freq: Four times a day (QID) | ORAL | 0 refills | Status: AC | PRN

## 2021-01-15 MED ORDER — MIDAZOLAM HCL 5 MG/5ML IJ SOLN
INTRAMUSCULAR | Status: AC
  Filled 2021-01-15: qty 5

## 2021-01-15 MED ORDER — FENTANYL CITRATE (PF) 100 MCG/2ML IJ SOLN
INTRAMUSCULAR | Status: DC | PRN
  Administered 2021-01-15: 25 ug via INTRAVENOUS

## 2021-01-15 MED ORDER — IODIXANOL 320 MG/ML IV SOLN
INTRAVENOUS | Status: DC | PRN
  Administered 2021-01-15: 30 mL

## 2021-01-15 MED ORDER — METOPROLOL TARTRATE 100 MG PO TABS: 50.0000 mg | ORAL_TABLET | Freq: Two times a day (BID) | ORAL | Status: DC

## 2021-01-15 MED ORDER — LIDOCAINE HCL (PF) 1 % IJ SOLN
INTRAMUSCULAR | Status: DC | PRN
  Administered 2021-01-15: 2 mL

## 2021-01-15 MED ORDER — FENTANYL CITRATE (PF) 100 MCG/2ML IJ SOLN
INTRAMUSCULAR | Status: AC
  Filled 2021-01-15: qty 2

## 2021-01-15 MED ORDER — CEPHALEXIN 500 MG PO CAPS: 500.0000 mg | ORAL_CAPSULE | Freq: Every day | ORAL | 0 refills | Status: AC

## 2021-01-15 MED ORDER — HEPARIN (PORCINE) IN NACL 1000-0.9 UT/500ML-% IV SOLN
INTRAVENOUS | Status: AC
  Filled 2021-01-15: qty 500

## 2021-01-15 MED ORDER — LIDOCAINE HCL (PF) 1 % IJ SOLN
INTRAMUSCULAR | Status: AC
  Filled 2021-01-15: qty 30

## 2021-01-15 MED ORDER — MIDAZOLAM HCL 2 MG/2ML IJ SOLN
INTRAMUSCULAR | Status: DC | PRN
  Administered 2021-01-15: 1 mg via INTRAVENOUS

## 2021-01-15 SURGICAL SUPPLY — 14 items
BAG SNAP BAND KOVER 36X36 (MISCELLANEOUS) ×3 IMPLANT
BALLN MUSTANG 12X60X75 (BALLOONS) ×3
BALLOON MUSTANG 12X60X75 (BALLOONS) IMPLANT
COVER DOME SNAP 22 D (MISCELLANEOUS) ×3 IMPLANT
KIT ENCORE 26 ADVANTAGE (KITS) ×1 IMPLANT
KIT MICROPUNCTURE NIT STIFF (SHEATH) ×1 IMPLANT
PROTECTION STATION PRESSURIZED (MISCELLANEOUS) ×3
SHEATH PINNACLE R/O II 7F 4CM (SHEATH) ×1 IMPLANT
SHEATH PROBE COVER 6X72 (BAG) ×3 IMPLANT
STATION PROTECTION PRESSURIZED (MISCELLANEOUS) ×2 IMPLANT
STOPCOCK MORSE 400PSI 3WAY (MISCELLANEOUS) ×3 IMPLANT
TRAY PV CATH (CUSTOM PROCEDURE TRAY) ×3 IMPLANT
TUBING CIL FLEX 10 FLL-RA (TUBING) ×3 IMPLANT
WIRE BENTSON .035X145CM (WIRE) ×1 IMPLANT

## 2021-01-15 NOTE — Progress Notes (Signed)
[facility] Progress Note   Subjective:   Patient seen and examined at bedside. Waiting to go for fistulogram.  States "if they do not come to get me by 2pm I am going to walk out of here.  I just have to make 1 phone call and I am out."  Refusing dialysis because he states "too much dialysis makes me feel bad. My legs are skinny.  Are you trying to snap them in half?"  Reminded patient of the other benefits/functions of dialysis and consequences of missing HD.  Continues to refuse.  Denies CP, HA, SOB, edema, abdominal pain, n/v/d, weakness, dizziness and fatigue.  Biggest complaint is swelling/pain in L arm/hand.  Objective Vitals:   01/15/21 1205 01/15/21 1210 01/15/21 1210 01/15/21 1244  BP: (!) 169/78  (!) 152/77 (!) 143/65  Pulse: (!) 49 (!) 57 (!) 57 (!) 57  Resp: (!) 8 14 14 16   Temp:      TempSrc:      SpO2: 100% 100% 100% 91%  Weight:      Height:       Physical Exam General:well appearing male in NAD Heart:RRR, +3/4 systolic murmur Lungs:mostly CTAB, no crackles appreciated, nml WOB on 2L via [place] Abdomen:soft, NTND Extremities:no LE edema, +RUE edema, ace wrap in place over forearm Dialysis Access: LU AVF +b/t   Filed Weights   01/12/21 1702 01/12/21 2127  Weight: 59 kg 60.6 kg    Intake/Output Summary (Last 24 hours) at 01/15/2021 1330 Last data filed at 01/15/2021 1036 Gross per 24 hour  Intake 300 ml  Output 0 ml  Net 300 ml    Additional Objective Labs: Basic Metabolic Panel: Recent Labs  Lab 01/13/21 0459 01/14/21 0627 01/15/21 0354  NA 134* 135 135  K 3.4* 4.4 4.5  CL 92* 94* 93*  CO2 32 31 27  GLUCOSE 88 90 94  BUN 14 29* 43*  CREATININE 3.45* 6.19* 7.62*  CALCIUM 9.6 9.1 9.0  PHOS  --  6.5* 7.5*   Liver Function Tests: Recent Labs  Lab 01/12/21 1658 01/14/21 0627 01/15/21 0354  AST 19  --   --   ALT <5  --   --   ALKPHOS 71  --   --   BILITOT 1.4*  --   --   PROT 6.4*  --   --   ALBUMIN 3.5 3.1* 3.2*   CBC: Recent  Labs  Lab 01/12/21 1658 01/12/21 1704 01/13/21 0459 01/14/21 0627 01/15/21 0354  WBC 6.5  --  4.6 4.1 4.8  NEUTROABS 5.4  --   --  3.0 3.5  HGB 9.5*   < > 9.7* 9.7* 9.8*  HCT 29.9*   < > 30.4* 30.6* 30.8*  MCV 111.6*  --  108.2* 110.5* 110.8*  PLT 183  --  165 180 181   < > = values in this interval not displayed.    Cardiac Enzymes: Recent Labs  Lab 01/12/21 1658  CKTOTAL 42*   CBG: Recent Labs  Lab 01/10/21 1039 01/12/21 1847  GLUCAP 211* 88   Iron Studies: No results for input(s): IRON, TIBC, TRANSFERRIN, FERRITIN in the last 72 hours. Lab Results  Component Value Date   INR 1.2 01/12/2021   INR 1.1 01/04/2020   INR 1.14 07/06/2018   Studies/Results: PERIPHERAL VASCULAR CATHETERIZATION  Result Date: 01/15/2021 Patient name: Thomas Robinson MRN: [id number] DOB: 20-Mar-1961 Sex: male 01/15/2021 Pre-operative Diagnosis: ESRD Post-operative diagnosis:  Same Surgeon:  [name] Procedure Performed:  1.  Ultrasound-guided access, left cephalic vein  2.  Fistulogram  3.  Venoplasty, left subclavian vein  4.  Conscious sedation, 20 minutes Indications: The patient recently underwent conversion of a left radiocephalic fistula to a brachiocephalic fistula.  He has had significant arm swelling since that time.  He is here for fistulogram Procedure:  The patient was identified in the holding area and taken to room 8.  The patient was then placed supine on the table and prepped and draped in the usual sterile fashion.  A time out was called.  Conscious sedation was administered with the use of IV fentanyl and Versed under continuous physician and nurse monitoring.  Heart rate, blood pressure, and oxygen saturations were continuously monitored.  Total sedation time was 20 minutes ultrasound was used to evaluate the fistula.  The vein was patent and compressible.  A digital ultrasound image was acquired.  The fistula was then accessed under ultrasound guidance using a micropuncture  needle.  An 018 wire was then asvanced without resistance and a micropuncture sheath was placed.  Contrast injections were then performed through the sheath. Findings: The visualized portions of the cephalic vein fistula are patent without stenosis.  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  The remaining portion of the central venous system is widely patent.  Intervention: After the above images were acquired the decision made to proceed with intervention.  Over an 035 wire, a 7 French sheath was placed.  I used a 12 x 60 Mustang balloon to perform venoplasty of the left subclavian vein stenosis.  This was done for 30 seconds at 15 atm.  Follow-up imaging revealed resolution of the stenosis.  The wire and balloon were removed.  The sheath was removed and a Monocryl was used for closure Impression:  #1  Approximately 70% left subclavian vein (central vein), successfully treated with a 12 x 60 balloon with no residual stenosis V. [name], M.D., [facility] [place] Office: [phone] Pager:  [phone]  DG CHEST PORT 1 VIEW  Result Date: 01/14/2021 CLINICAL DATA:  Follow-up EXAM: PORTABLE CHEST - 1 VIEW COMPARISON:  01/12/2021 FINDINGS: The mediastinal contours are within normal limits. Unchanged moderate cardiomegaly. Improved aeration of the lungs bilaterally with mild persistent scattered peribronchovascular patchy opacities. No significant pleural effusion or pneumothorax. Atherosclerotic calcification of the aortic arch. Multilevel cervicothoracic anterior fusion hardware remains in place without complicating feature. No acute osseous abnormality. IMPRESSION: 1. Improved aeration bilaterally compatible with improving mild pulmonary edema. 2. Stable moderate cardiomegaly. 3.  Aortic Atherosclerosis (ICD10-I70.0). Electronically Signed   By: Ruthann Cancer MD   On: 01/14/2021 08:23   ECHOCARDIOGRAM COMPLETE  Result Date: 01/13/2021     ECHOCARDIOGRAM REPORT   Patient Name:   Thomas Robinson Date of Exam: 01/13/2021 Medical Rec #:  [id number]        Height:       68.0 in Accession #:    [id number]       Weight:       133.6 lb Date of Birth:  18-Oct-1960        BSA:          1.722 m Patient Age:    60 years         BP:           118/73 mmHg Patient Gender: M                HR:  78 bpm. Exam Location:  Inpatient Procedure: 2D Echo, 3D Echo, Cardiac Doppler and Color Doppler Indications:    Syncope R55  History:        Patient has prior history of Echocardiogram examinations, most                 recent 05/17/2019. CHF, Pulmonary HTN; Risk Factors:Hypertension                 and Dyslipidemia. End stage renal disease, Macrocytic anemia.                 GERD. Thoracic ascending aortic aneurysm 4.5 by CT.  Sonographer:    Darlina Sicilian RDCS Referring Phys: [id number] Asher  Sonographer Comments: Global longitudinal strain was attempted. IMPRESSIONS  1. Left ventricular ejection fraction, by estimation, is 60 to 65%. The left ventricle has normal function. The left ventricle has no regional wall motion abnormalities. There is moderate concentric left ventricular hypertrophy. Left ventricular diastolic parameters are consistent with Grade I diastolic dysfunction (impaired relaxation).  2. Right ventricular systolic function is mildly reduced. The right ventricular size is mildly enlarged. There is severely elevated pulmonary artery systolic pressure. The estimated right ventricular systolic pressure is 29.5 mmHg.  3. There is a trivial pericardial effusion posterior to the left ventricle.  4. The mitral valve is abnormal. Mild mitral valve regurgitation. Moderate mitral annular calcification.  5. Tricuspid valve regurgitation is moderate.  6. The aortic valve is tricuspid. There is moderate calcification of the aortic valve. Aortic valve regurgitation is mild. Mild to moderate aortic valve stenosis. Aortic valve mean gradient measures  12.0 mmHg. Dimentionless index 0.58.  7. Aortic dilatation noted. There is mild dilatation of the ascending aorta, measuring 41 mm.  8. The inferior vena cava is dilated in size with <50% respiratory variability, suggesting right atrial pressure of 15 mmHg. FINDINGS  Left Ventricle: Left ventricular ejection fraction, by estimation, is 60 to 65%. The left ventricle has normal function. The left ventricle has no regional wall motion abnormalities. The left ventricular internal cavity size was small. There is moderate  concentric left ventricular hypertrophy. Left ventricular diastolic parameters are consistent with Grade I diastolic dysfunction (impaired relaxation). Right Ventricle: The right ventricular size is mildly enlarged. Right vetricular wall thickness was not assessed. Right ventricular systolic function is mildly reduced. There is severely elevated pulmonary artery systolic pressure. The tricuspid regurgitant velocity is 3.87 m/s, and with an assumed right atrial pressure of 15 mmHg, the estimated right ventricular systolic pressure is 28.4 mmHg. Left Atrium: Left atrial size was normal in size. Right Atrium: Right atrial size was normal in size. Pericardium: Trivial pericardial effusion is present. The pericardial effusion is posterior to the left ventricle. Mitral Valve: The mitral valve is abnormal. There is mild calcification of the mitral valve leaflet(s). Moderate mitral annular calcification. Mild mitral valve regurgitation. Tricuspid Valve: The tricuspid valve is grossly normal. Tricuspid valve regurgitation is moderate. Aortic Valve: The aortic valve is tricuspid. There is moderate calcification of the aortic valve. There is mild aortic valve annular calcification. Aortic valve regurgitation is mild. Aortic regurgitation PHT measures 325 msec. Mild to moderate aortic stenosis is present. Aortic valve mean gradient measures 12.0 mmHg. Aortic valve peak gradient measures 23.2 mmHg. Aortic valve  area, by VTI measures 1.46 cm. Pulmonic Valve: The pulmonic valve was grossly normal. Pulmonic valve regurgitation is trivial. Aorta: The aortic root is normal in size and structure and aortic dilatation noted. There is mild  dilatation of the ascending aorta, measuring 41 mm. Venous: The inferior vena cava is dilated in size with less than 50% respiratory variability, suggesting right atrial pressure of 15 mmHg. IAS/Shunts: No atrial level shunt detected by color flow Doppler.  LEFT VENTRICLE PLAX 2D LVIDd:         3.80 cm     Diastology LVIDs:         2.40 cm     LV e' medial:    3.44 cm/s LV PW:         1.00 cm     LV E/e' medial:  25.4 LV IVS:        1.10 cm     LV e' lateral:   4.26 cm/s LVOT diam:     1.80 cm     LV E/e' lateral: 20.5 LV SV:         54 LV SV Index:   32 LVOT Area:     2.54 cm  LV Volumes (MOD) LV vol d, MOD A2C: 74.7 ml LV vol d, MOD A4C: 67.4 ml LV vol s, MOD A2C: 43.2 ml LV vol s, MOD A4C: 33.1 ml LV SV MOD A2C:     31.5 ml LV SV MOD A4C:     67.4 ml LV SV MOD BP:      34.2 ml RIGHT VENTRICLE RV S prime:     14.90 cm/s TAPSE (M-mode): 2.0 cm LEFT ATRIUM             Index       RIGHT ATRIUM           Index LA diam:        2.80 cm 1.63 cm/m  RA Area:     18.70 cm LA Vol (A2C):   66.2 ml 38.45 ml/m RA Volume:   52.80 ml  30.67 ml/m LA Vol (A4C):   40.7 ml 23.64 ml/m LA Biplane Vol: 57.9 ml 33.63 ml/m  AORTIC VALVE AV Area (Vmax):    1.26 cm AV Area (Vmean):   1.31 cm AV Area (VTI):     1.46 cm AV Vmax:           241.00 cm/s AV Vmean:          156.000 cm/s AV VTI:            0.372 m AV Peak Grad:      23.2 mmHg AV Mean Grad:      12.0 mmHg LVOT Vmax:         119.00 cm/s LVOT Vmean:        80.300 cm/s LVOT VTI:          0.214 m LVOT/AV VTI ratio: 0.58 AI PHT:            325 msec  AORTA Ao Root diam: 3.70 cm Ao Asc diam:  4.10 cm MITRAL VALVE                TRICUSPID VALVE MV Area (PHT): 1.66 cm     TR Peak grad:   59.9 mmHg MV Decel Time: 458 msec     TR Vmax:        387.00 cm/s MV E  velocity: 87.40 cm/s MV A velocity: 113.00 cm/s  SHUNTS MV E/A ratio:  0.77         Systemic VTI:  0.21 m  Systemic Diam: 1.80 cm Rozann Lesches MD Electronically signed by Rozann Lesches MD Signature Date/Time: 01/13/2021/2:53:42 PM    Final     Medications:  . amLODipine  10 mg Oral Daily  . Chlorhexidine Gluconate Cloth  6 each Topical Q0600  . cholecalciferol  1,000 Units Oral Daily  . cinacalcet  60 mg Oral Q breakfast  . [START ON 01/16/2021] doxercalciferol  1 mcg Intravenous Q M,W,F-HD  . ferric citrate  420 mg Oral TID WC  . fluticasone furoate-vilanterol  1 puff Inhalation Daily  . heparin  5,000 Units Subcutaneous Q8H  . hydrALAZINE  50 mg Oral BID  . mouth rinse  15 mL Mouth Rinse BID  . metoprolol tartrate  50 mg Oral BID  . multivitamin  1 tablet Oral Daily  . sevelamer carbonate  800 mg Oral Q breakfast    Dialysis Orders: F - MWF 4hrs  56.5kg, 2K 2Ca LU AVF Hep 2000 qHD mircera 247mg q2wks - last 5/9 venofer 534mqwk Hectorol [id number]   Assessment/Plan: 1. Hypoxemia/ vol overload/ pulm edema - volume status improved, no edema, CXR this 5/16 with improved aeration/improving pulmonary edema.  Continue to titrate down volume as tolerated. Remains on 2L O2, wean down to RA.   2. LUE edema/chronic L hand/forearm pain - VVS following, did recent access surgery (ligated LFA AVF andcreated a ready-to-useLUA AVF).  Fistulogram today with pta 70% L subclavian vein stenosis.  Hopeful for improvement in edema/pain.  3. ESRD - on HD MWF.  HD here 5/14 off schedule due to volume overload.  Refused HD yesterday, wants to be discharged.  Plan for HD tomorrow per regular schedule at outpatient center.     4. Anemia ckd - Hb 9.8 today, recently dosed with ESA.  5. MBD ckd - Calcium at goal.  Phos elevated.  Continue binders and VDRA.  6. HTN - blood pressure well controlled. Continue home meds.  7. Nutrition - Renal diet w/fluid  restrictions   LinJen MowA-C CarBradner17/2022,1:30 PM  LOS: 2 days

## 2021-01-15 NOTE — Progress Notes (Signed)
PT Cancellation Note  Patient Details Name: Thomas Robinson MRN: [id number] DOB: 02/26/1961   Cancelled Treatment:    Reason Eval/Treat Not Completed: Patient at procedure or test/unavailable   For fistulogram and venoplasty today;   Will continue to follow;   Roney Marion, [place] Pager [phone] Office [place] 01/15/2021, 12:52 PM

## 2021-01-15 NOTE — Op Note (Signed)
    Patient name: Thomas Robinson MRN: [id number] DOB: [birth year] Sex: male  01/15/2021 Pre-operative Diagnosis: ESRD Post-operative diagnosis:  Same Surgeon:  [name] Procedure Performed:  1.  Ultrasound-guided access, left cephalic vein  2.  Fistulogram  3.  Venoplasty, left subclavian vein  4.  Conscious sedation, 20 minutes   Indications: The patient recently underwent conversion of a left radiocephalic fistula to a brachiocephalic fistula.  He has had significant arm swelling since that time.  He is here for fistulogram  Procedure:  The patient was identified in the holding area and taken to room 8.  The patient was then placed supine on the table and prepped and draped in the usual sterile fashion.  A time out was called.  Conscious sedation was administered with the use of IV fentanyl and Versed under continuous physician and nurse monitoring.  Heart rate, blood pressure, and oxygen saturations were continuously monitored.  Total sedation time was 20 minutes ultrasound was used to evaluate the fistula.  The vein was patent and compressible.  A digital ultrasound image was acquired.  The fistula was then accessed under ultrasound guidance using a micropuncture needle.  An 018 wire was then asvanced without resistance and a micropuncture sheath was placed.  Contrast injections were then performed through the sheath.  Findings: The visualized portions of the cephalic vein fistula are patent without stenosis.  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  The remaining portion of the central venous system is widely patent.   Intervention: After the above images were acquired the decision made to proceed with intervention.  Over an 035 wire, a 7 French sheath was placed.  I used a 12 x 60 Mustang balloon to perform venoplasty of the left subclavian vein stenosis.  This was done for 30 seconds at 15 atm.  Follow-up imaging revealed resolution of the stenosis.  The  wire and balloon were removed.  The sheath was removed and a Monocryl was used for closure  Impression:  #1  Approximately 70% left subclavian vein (central vein), successfully treated with a 12 x 60 balloon with no residual stenosis    V. [name], M.D., [facility] [place] Office: [phone] Pager:  [phone]

## 2021-01-15 NOTE — Discharge Summary (Signed)
Discharge Summary  Thomas Robinson UXN:[id number] DOB: [birth year]  PCP: [name], PA  Admit date: 01/12/2021 Discharge date: 01/15/2021  Time spent: 40 mins  Recommendations for Outpatient Follow-up:  1. Follow-up with PCP in 1 week 2. Follow-up with cardiology as scheduled 3. Follow-up with vascular surgery as scheduled  Discharge Diagnoses:  Active Hospital Problems   Diagnosis Date Noted  . Acute on chronic respiratory failure with hypoxia ([place]) 01/12/2021  . Acute on chronic respiratory failure ([place]) 01/13/2021  . Acute on chronic diastolic CHF (congestive heart failure) ([place]) 04/13/2018  . ESRD on dialysis ([place]) 02/16/2018  . Essential hypertension 04/09/2015    Resolved Hospital Problems  No resolved problems to display.    Discharge Condition: Stable  Diet recommendation: Renal diet  Vitals:   01/15/21 1210 01/15/21 1244  BP: (!) 152/77 (!) 143/65  Pulse: (!) 57 (!) 57  Resp: 14 16  Temp:    SpO2: 100% 91%    History of present illness:  MWF, HTN, dCHF who lives with a friend and was found down on 01/12/21. Pt unclear how long he was down. Doesn't remember what happened. Pt also with h/o compliance issues with dialysis. Pt with h/o chronic hypoxic resp failure, supposed to be on 2L home O2 (not really sure how much he uses it though) as he sats low 80s on RA at baseline. Pt recently had a ligation of his left arm radiocephalic fistula, that was converted to a brachiocephalic AVG by Dr Trula Slade on 01/10/21. Reports pain and swelling to LUE for months, with notable L hand swelling. Also c/o L shoulder pain. In the ED, noted to be requiring about 5L O2. CXR shows pulm vascular congestion. Nephrology consulted. Pt admitted for further management.    Today, patient denies any new complaints, very eager to be discharged.  Patient denies any chest pain, worsening shortness of breath, abdominal pain, nausea/vomiting, fever/chills.  Patient advised to follow-up with  cardiology, vascular, PCP.  Patient advised to be compliant with dialysis and medications.   Hospital Course:  Principal Problem:   Acute on chronic respiratory failure with hypoxia (HCC) Active Problems:   Essential hypertension   ESRD on dialysis ([place])   Acute on chronic diastolic CHF (congestive heart failure) (HCC)   Acute on chronic respiratory failure (HCC)   Volume overload/pulmonary edema ESRD on HD M/W/F Resolved Chest x-ray with volume overload on admission Nephrology consulted, HD on 01/12/2021, planned for HD on 01/14/21, patient refused Continue outpatient HD  Possible syncope Found down at home Unknown etiology CT head with no acute intracranial pathology CT cervical spine with no fracture EKG with no acute ST changes ECHO showed EF of 60-65%, no RWMA, Grade 1DD [facility] PT/OT  Elevated troponin Likely demand ischemia due to volume overload and pulmonary hypertension Had a normal cardiac cath on 08/2020 at [facility] Cardiology consulted, appreciate recs, outpatient follow up  LUE edema Notable swelling in left hand LUE x-ray with no fracture or dislocation, noted some soft tissue swelling with air present likely from recent surgery versus infection LUE Doppler negative for DVT, but shows ??arterial flow concern Vascular surgery consulted for further management, recently revised from radiocephalic to brachiocephalic fistula by Dr. Trula Slade on 01/10/2021, concerned for central venous stenosis, repeat fistulogram on 01/15/21 showed approximately 70 to 80% stenosis within the subclavian vein, s/p venoplasty Pain management, follow-up with vascular surgeon Dr. Trula Slade in 2 weeks, recommended Keflex p.o. for 1 week (discussed with pharmacy, due to ESRD, recommend 500 mg  daily x7 days)  Acute on chronic hypoxic respiratory failure Resolved Likely 2/2 volume overload Management as above  Chronic diastolic HF Fluid management via dialysis Follow-up with  cardiology  Hypertension BP now stable Continue amlodipine, hydralazine, metoprolol decreased to 50 mg twice daily  Anemia of chronic kidney disease Hemoglobin around baseline Follow-up with PCP, nephrology      Estimated body mass index is 20.31 kg/m as calculated from the following:   Height as of this encounter: 5' 8"  (1.727 m).   Weight as of this encounter: 60.6 kg.    Procedures:  Fistulogram with venoplasty  Consultations:  Nephrology  Cardiology  Vascular surgery  Discharge Exam: BP (!) 143/65 (BP Location: Right Arm)   Pulse (!) 57   Temp 98.4 F (36.9 C)   Resp 16   Ht 5' 8"  (1.727 m)   Wt 60.6 kg   SpO2 91%   BMI 20.31 kg/m   General: NAD Cardiovascular: S1, S2 present Respiratory: CTA B Extremity: LUE swelling, especially around hand, no bilateral lower extremity edema noted     Discharge Instructions You were cared for by a hospitalist during your hospital stay. If you have any questions about your discharge medications or the care you received while you were in the hospital after you are discharged, you can call the unit and asked to speak with the hospitalist on call if the hospitalist that took care of you is not available. Once you are discharged, your primary care physician will handle any further medical issues. Please note that NO REFILLS for any discharge medications will be authorized once you are discharged, as it is imperative that you return to your primary care physician (or establish a relationship with a primary care physician if you do not have one) for your aftercare needs so that they can reassess your need for medications and monitor your lab values.  Discharge Instructions    Diet - low sodium heart healthy   Complete by: As directed    Discharge wound care:   Complete by: As directed    As discussed with Vascular surgery   Increase activity slowly   Complete by: As directed      Allergies as of 01/15/2021   No Known  Allergies     Medication List    STOP taking these medications   MIRCERA IJ   traMADol 50 MG tablet Commonly known as: ULTRAM     TAKE these medications   acetaminophen 500 MG tablet Commonly known as: TYLENOL Take 500 mg by mouth every 6 (six) hours as needed for moderate pain or headache.   albuterol 108 (90 Base) MCG/ACT inhaler Commonly known as: VENTOLIN HFA Inhale 2 puffs into the lungs every 6 (six) hours as needed for wheezing or shortness of breath.   amLODipine 10 MG tablet Commonly known as: NORVASC Take 10 mg by mouth daily.   Auryxia 1 GM 210 MG(Fe) tablet Generic drug: ferric citrate Take 420 mg by mouth 3 (three) times daily with meals.   Breo Ellipta 100-25 MCG/INH Aepb Generic drug: fluticasone furoate-vilanterol Inhale 1 puff into the lungs daily.   cephALEXin 500 MG capsule Commonly known as: KEFLEX Take 1 capsule (500 mg total) by mouth daily for 7 days.   cholecalciferol 25 MCG (1000 UNIT) tablet Commonly known as: VITAMIN D3 Take 1,000 Units by mouth daily.   cinacalcet 60 MG tablet Commonly known as: SENSIPAR Take 60 mg by mouth daily.   DIPHENHYDRAMINE HCL PO Take 25 mg  by mouth as needed (allergies).   doxercalciferol 0.5 MCG capsule Commonly known as: HECTOROL Doxercalciferol (Hectorol)   furosemide 40 MG tablet Commonly known as: LASIX Take 1 tablet (40 mg total) by mouth See admin instructions. On Sunday, Monday, Wednesday, Thursday and Saturday What changed:   when to take this  reasons to take this  additional instructions   gabapentin 300 MG capsule Commonly known as: NEURONTIN Take 300 mg by mouth 2 (two) times daily as needed (pain).   HEALTHY HEART COMPLEX PO Take 1 tablet by mouth daily.   hydrALAZINE 25 MG tablet Commonly known as: APRESOLINE Take 50 mg by mouth in the morning and at bedtime.   HYDROcodone-acetaminophen 5-325 MG tablet Commonly known as: NORCO/VICODIN Take 1 tablet by mouth every 6 (six)  hours as needed for up to 5 days for moderate pain or severe pain.   metoprolol tartrate 100 MG tablet Commonly known as: LOPRESSOR Take 0.5 tablets (50 mg total) by mouth 2 (two) times daily. What changed: how much to take   multivitamin Tabs tablet Take 1 tablet by mouth daily.   pregabalin 25 MG capsule Commonly known as: LYRICA Take 25 mg by mouth daily as needed (pain).   sevelamer carbonate 800 MG tablet Commonly known as: RENVELA Take 800 mg by mouth daily.            Discharge Care Instructions  (From admission, onward)         Start     Ordered   01/15/21 0000  Discharge wound care:       Comments: As discussed with Vascular surgery   01/15/21 1307         No Known Allergies  Follow-up Information    [name], [place]. Schedule an appointment as soon as possible for a visit in 1 week(s).   Specialty: Physician Assistant Contact information: [place] [address] [phone]        [name], DO .   Specialty: Cardiology Contact information: [place] [address] [phone]        Serafina Mitchell, MD Follow up.   Specialties: Vascular Surgery, Cardiology Why: Follow up in 2 weeks, they will call you for an appointment Contact information: [place] [address] [phone]                The results of significant diagnostics from this hospitalization (including imaging, microbiology, ancillary and laboratory) are listed below for reference.    Significant Diagnostic Studies: DG Chest 1 View  Result Date: 01/12/2021 CLINICAL DATA:  Pain following fall EXAM: CHEST  1 VIEW COMPARISON:  September 19, 2020 FINDINGS: There is cardiomegaly with pulmonary venous hypertension. The interstitium is mildly prominent without overt pulmonary edema. No appreciable airspace opacity. No adenopathy. No pneumothorax. There is a skin fold on the left. No fracture evident.  Postoperative change in lower cervical spine. There is aortic atherosclerosis. IMPRESSION: Suspect underlying chronic bronchitis. No edema or consolidation. There is cardiomegaly with pulmonary vascular congestion. No pneumothorax. Aortic Atherosclerosis (ICD10-I70.0). Electronically Signed   By: Lowella Grip III M.D.   On: 01/12/2021 18:02   DG Pelvis 1-2 Views  Result Date: 01/12/2021 CLINICAL DATA:  Pain following fall EXAM: PELVIS - 1-2 VIEW COMPARISON:  None. FINDINGS: There is no evidence of pelvic fracture or dislocation. Joint spaces appear normal. Multiple foci of vascular calcification noted bilaterally. IMPRESSION: No fracture or dislocation. No evident arthropathy. Multiple foci of  vascular calcification noted. Electronically Signed   By: Lowella Grip III M.D.   On: 01/12/2021 18:07   DG Elbow Complete Left  Result Date: 01/12/2021 CLINICAL DATA:  Pain following fall. Reported recent arm region surgery EXAM: LEFT ELBOW - COMPLETE 3+ VIEW COMPARISON:  None. FINDINGS: Frontal, lateral, and bilateral oblique views were obtained. There is soft tissue swelling. There is no appreciable fracture or dislocation. No joint effusion. No joint space narrowing or erosion. Multiple foci of arterial vascular calcification noted. Note that there is soft tissue air in the volar aspect of the distal humerus. IMPRESSION: No fracture or dislocation. Soft tissue swelling and soft tissue air present. A degree of infection within the soft tissues of the distal upper arm region is questioned. Nearby surgical clips. Multiple foci of arterial vascular calcification noted. Electronically Signed   By: Lowella Grip III M.D.   On: 01/12/2021 18:05   DG Forearm Left  Result Date: 01/12/2021 CLINICAL DATA:  Pain following fall.  Recent fistula surgery EXAM: LEFT FOREARM - 2 VIEW COMPARISON:  None. FINDINGS: Frontal and lateral views were obtained. No acute fracture or dislocation. No appreciable joint space  narrowing or erosion. Multiple foci of vascular calcification noted. Soft tissue air is noted volar to the distal upper extremity. IMPRESSION: Foci of soft tissue air may be have infectious etiology or may be secondary to recent surgery. Clinical assessment in this regard warranted. No acute fracture or dislocation. No appreciable joint space narrowing or erosion. Multiple foci of arterial vascular calcification noted. Electronically Signed   By: Lowella Grip III M.D.   On: 01/12/2021 18:06   CT Head Wo Contrast  Result Date: 01/12/2021 CLINICAL DATA:  Fall EXAM: CT HEAD WITHOUT CONTRAST CT CERVICAL SPINE WITHOUT CONTRAST TECHNIQUE: Multidetector CT imaging of the head and cervical spine was performed following the standard protocol without intravenous contrast. Multiplanar CT image reconstructions of the cervical spine were also generated. COMPARISON:  None. FINDINGS: CT HEAD FINDINGS Brain: No evidence of acute infarction, hemorrhage, hydrocephalus, extra-axial collection or mass lesion/mass effect. Periventricular white matter hypodensity. Vascular: No hyperdense vessel or unexpected calcification. Skull: Normal. Negative for fracture or focal lesion. Sinuses/Orbits: No acute finding. Other: None. CT CERVICAL SPINE FINDINGS Alignment: Degenerative straightening and reversal of the normal cervical lordosis. Skull base and vertebrae: No acute fracture. No primary bone lesion or focal pathologic process. Soft tissues and spinal canal: No prevertebral fluid or swelling. No visible canal hematoma. Disc levels: Status post anterior cervical discectomy and fusion of C5 through C7. Upper chest: Negative. Other: None. IMPRESSION: 1. No acute intracranial pathology. Small-vessel white matter disease. 2. No fracture or static subluxation of the cervical spine. 3. Status post anterior cervical discectomy and fusion of C5 through C7. Electronically Signed   By: Eddie Candle M.D.   On: 01/12/2021 17:50   CT Cervical  Spine Wo Contrast  Result Date: 01/12/2021 CLINICAL DATA:  Fall EXAM: CT HEAD WITHOUT CONTRAST CT CERVICAL SPINE WITHOUT CONTRAST TECHNIQUE: Multidetector CT imaging of the head and cervical spine was performed following the standard protocol without intravenous contrast. Multiplanar CT image reconstructions of the cervical spine were also generated. COMPARISON:  None. FINDINGS: CT HEAD FINDINGS Brain: No evidence of acute infarction, hemorrhage, hydrocephalus, extra-axial collection or mass lesion/mass effect. Periventricular white matter hypodensity. Vascular: No hyperdense vessel or unexpected calcification. Skull: Normal. Negative for fracture or focal lesion. Sinuses/Orbits: No acute finding. Other: None. CT CERVICAL SPINE FINDINGS Alignment: Degenerative straightening and reversal of the normal cervical  lordosis. Skull base and vertebrae: No acute fracture. No primary bone lesion or focal pathologic process. Soft tissues and spinal canal: No prevertebral fluid or swelling. No visible canal hematoma. Disc levels: Status post anterior cervical discectomy and fusion of C5 through C7. Upper chest: Negative. Other: None. IMPRESSION: 1. No acute intracranial pathology. Small-vessel white matter disease. 2. No fracture or static subluxation of the cervical spine. 3. Status post anterior cervical discectomy and fusion of C5 through C7. Electronically Signed   By: Eddie Candle M.D.   On: 01/12/2021 17:50   PERIPHERAL VASCULAR CATHETERIZATION  Result Date: 01/15/2021 Patient name: Farah Benish MRN: [id number] DOB: 12-04-1960 Sex: male 01/15/2021 Pre-operative Diagnosis: ESRD Post-operative diagnosis:  Same Surgeon:  [name] Procedure Performed:  1.  Ultrasound-guided access, left cephalic vein  2.  Fistulogram  3.  Venoplasty, left subclavian vein  4.  Conscious sedation, 20 minutes Indications: The patient recently underwent conversion of a left radiocephalic fistula to a brachiocephalic fistula.  He has  had significant arm swelling since that time.  He is here for fistulogram Procedure:  The patient was identified in the holding area and taken to room 8.  The patient was then placed supine on the table and prepped and draped in the usual sterile fashion.  A time out was called.  Conscious sedation was administered with the use of IV fentanyl and Versed under continuous physician and nurse monitoring.  Heart rate, blood pressure, and oxygen saturations were continuously monitored.  Total sedation time was 20 minutes ultrasound was used to evaluate the fistula.  The vein was patent and compressible.  A digital ultrasound image was acquired.  The fistula was then accessed under ultrasound guidance using a micropuncture needle.  An 018 wire was then asvanced without resistance and a micropuncture sheath was placed.  Contrast injections were then performed through the sheath. Findings: The visualized portions of the cephalic vein fistula are patent without stenosis.  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  The remaining portion of the central venous system is widely patent.  Intervention: After the above images were acquired the decision made to proceed with intervention.  Over an 035 wire, a 7 French sheath was placed.  I used a 12 x 60 Mustang balloon to perform venoplasty of the left subclavian vein stenosis.  This was done for 30 seconds at 15 atm.  Follow-up imaging revealed resolution of the stenosis.  The wire and balloon were removed.  The sheath was removed and a Monocryl was used for closure Impression:  #1  Approximately 70% left subclavian vein (central vein), successfully treated with a 12 x 60 balloon with no residual stenosis V. [name], M.D., [facility] [place] Office: [phone] Pager:  [phone]  PERIPHERAL VASCULAR CATHETERIZATION  Result Date: 01/01/2021 Patient name: Dameion Briles MRN: [id number] DOB: [birth year] Sex: male  01/01/2021 Pre-operative Diagnosis: ESRD Post-operative diagnosis:  Same Surgeon:  [name] Procedure Performed:  1.  U/S guided access, left cephalic vein fistula  2.  fistulogram  3.  Conscious sedation, 10 minutes Indications:  Patient has swelling from his arm crease down.  He is here fore fistulogram Procedure:  The patient was identified in the holding area and taken to room 8.  The patient was then placed supine on the table and prepped and draped in the usual sterile fashion.  A time out was called.  Conscious sedation was administered with the use of IV  fentanyl and Versed under continuous physician and nurse monitoring.  Heart rate, blood pressure, and oxygen saturations were continuously monitored.  Total sedation time was 10 minutes ultrasound was used to evaluate the fistula.  The vein was patent and compressible.  A digital ultrasound image was acquired.  The fistula was then accessed under ultrasound guidance using a micropuncture needle.  An 018 wire was then asvanced without resistance and a micropuncture sheath was placed.  Contrast injections were then performed through the sheath. Findings:  No central stenosis.  The fistula is patent without stenosis.  Intervention:  none Impression:  #1  Widely patent fistula  #2  Consider conversion to BCF for symptom control V. [name], M.D., [facility] [place] Office: [phone] Pager:  [phone]  DG CHEST PORT 1 VIEW  Result Date: 01/14/2021 CLINICAL DATA:  Follow-up EXAM: PORTABLE CHEST - 1 VIEW COMPARISON:  01/12/2021 FINDINGS: The mediastinal contours are within normal limits. Unchanged moderate cardiomegaly. Improved aeration of the lungs bilaterally with mild persistent scattered peribronchovascular patchy opacities. No significant pleural effusion or pneumothorax. Atherosclerotic calcification of the aortic arch. Multilevel cervicothoracic anterior fusion hardware remains in place without complicating  feature. No acute osseous abnormality. IMPRESSION: 1. Improved aeration bilaterally compatible with improving mild pulmonary edema. 2. Stable moderate cardiomegaly. 3.  Aortic Atherosclerosis (ICD10-I70.0). Electronically Signed   By: Ruthann Cancer MD   On: 01/14/2021 08:23   ECHOCARDIOGRAM COMPLETE  Result Date: 01/13/2021    ECHOCARDIOGRAM REPORT   Patient Name:   SAAFIR ABDULLAH Date of Exam: 01/13/2021 Medical Rec #:  [id number]        Height:       68.0 in Accession #:    [id number]       Weight:       133.6 lb Date of Birth:  [birth year]        BSA:          1.722 m Patient Age:    2 years         BP:           118/73 mmHg Patient Gender: M                HR:           78 bpm. Exam Location:  Inpatient Procedure: 2D Echo, 3D Echo, Cardiac Doppler and Color Doppler Indications:    Syncope R55  History:        Patient has prior history of Echocardiogram examinations, most                 recent 05/17/2019. CHF, Pulmonary HTN; Risk Factors:Hypertension                 and Dyslipidemia. End stage renal disease, Macrocytic anemia.                 GERD. Thoracic ascending aortic aneurysm 4.5 by CT.  Sonographer:    Darlina Sicilian RDCS Referring Phys: [id number] [place]  Sonographer Comments: Global longitudinal strain was attempted. IMPRESSIONS  1. Left ventricular ejection fraction, by estimation, is 60 to 65%. The left ventricle has normal function. The left ventricle has no regional wall motion abnormalities. There is moderate concentric left ventricular hypertrophy. Left ventricular diastolic parameters are consistent with Grade I diastolic dysfunction (impaired relaxation).  2. Right ventricular systolic function is mildly reduced. The right ventricular size is mildly enlarged. There is severely elevated pulmonary artery systolic pressure. The estimated right ventricular  systolic pressure is 28.7 mmHg.  3. There is a trivial pericardial effusion posterior to the left ventricle.  4. The mitral valve  is abnormal. Mild mitral valve regurgitation. Moderate mitral annular calcification.  5. Tricuspid valve regurgitation is moderate.  6. The aortic valve is tricuspid. There is moderate calcification of the aortic valve. Aortic valve regurgitation is mild. Mild to moderate aortic valve stenosis. Aortic valve mean gradient measures 12.0 mmHg. Dimentionless index 0.58.  7. Aortic dilatation noted. There is mild dilatation of the ascending aorta, measuring 41 mm.  8. The inferior vena cava is dilated in size with <50% respiratory variability, suggesting right atrial pressure of 15 mmHg. FINDINGS  Left Ventricle: Left ventricular ejection fraction, by estimation, is 60 to 65%. The left ventricle has normal function. The left ventricle has no regional wall motion abnormalities. The left ventricular internal cavity size was small. There is moderate  concentric left ventricular hypertrophy. Left ventricular diastolic parameters are consistent with Grade I diastolic dysfunction (impaired relaxation). Right Ventricle: The right ventricular size is mildly enlarged. Right vetricular wall thickness was not assessed. Right ventricular systolic function is mildly reduced. There is severely elevated pulmonary artery systolic pressure. The tricuspid regurgitant velocity is 3.87 m/s, and with an assumed right atrial pressure of 15 mmHg, the estimated right ventricular systolic pressure is 86.7 mmHg. Left Atrium: Left atrial size was normal in size. Right Atrium: Right atrial size was normal in size. Pericardium: Trivial pericardial effusion is present. The pericardial effusion is posterior to the left ventricle. Mitral Valve: The mitral valve is abnormal. There is mild calcification of the mitral valve leaflet(s). Moderate mitral annular calcification. Mild mitral valve regurgitation. Tricuspid Valve: The tricuspid valve is grossly normal. Tricuspid valve regurgitation is moderate. Aortic Valve: The aortic valve is tricuspid. There  is moderate calcification of the aortic valve. There is mild aortic valve annular calcification. Aortic valve regurgitation is mild. Aortic regurgitation PHT measures 325 msec. Mild to moderate aortic stenosis is present. Aortic valve mean gradient measures 12.0 mmHg. Aortic valve peak gradient measures 23.2 mmHg. Aortic valve area, by VTI measures 1.46 cm. Pulmonic Valve: The pulmonic valve was grossly normal. Pulmonic valve regurgitation is trivial. Aorta: The aortic root is normal in size and structure and aortic dilatation noted. There is mild dilatation of the ascending aorta, measuring 41 mm. Venous: The inferior vena cava is dilated in size with less than 50% respiratory variability, suggesting right atrial pressure of 15 mmHg. IAS/Shunts: No atrial level shunt detected by color flow Doppler.  LEFT VENTRICLE PLAX 2D LVIDd:         3.80 cm     Diastology LVIDs:         2.40 cm     LV e' medial:    3.44 cm/s LV PW:         1.00 cm     LV E/e' medial:  25.4 LV IVS:        1.10 cm     LV e' lateral:   4.26 cm/s LVOT diam:     1.80 cm     LV E/e' lateral: 20.5 LV SV:         54 LV SV Index:   32 LVOT Area:     2.54 cm  LV Volumes (MOD) LV vol d, MOD A2C: 74.7 ml LV vol d, MOD A4C: 67.4 ml LV vol s, MOD A2C: 43.2 ml LV vol s, MOD A4C: 33.1 ml LV SV MOD A2C:  31.5 ml LV SV MOD A4C:     67.4 ml LV SV MOD BP:      34.2 ml RIGHT VENTRICLE RV S prime:     14.90 cm/s TAPSE (M-mode): 2.0 cm LEFT ATRIUM             Index       RIGHT ATRIUM           Index LA diam:        2.80 cm 1.63 cm/m  RA Area:     18.70 cm LA Vol (A2C):   66.2 ml 38.45 ml/m RA Volume:   52.80 ml  30.67 ml/m LA Vol (A4C):   40.7 ml 23.64 ml/m LA Biplane Vol: 57.9 ml 33.63 ml/m  AORTIC VALVE AV Area (Vmax):    1.26 cm AV Area (Vmean):   1.31 cm AV Area (VTI):     1.46 cm AV Vmax:           241.00 cm/s AV Vmean:          156.000 cm/s AV VTI:            0.372 m AV Peak Grad:      23.2 mmHg AV Mean Grad:      12.0 mmHg LVOT Vmax:          119.00 cm/s LVOT Vmean:        80.300 cm/s LVOT VTI:          0.214 m LVOT/AV VTI ratio: 0.58 AI PHT:            325 msec  AORTA Ao Root diam: 3.70 cm Ao Asc diam:  4.10 cm MITRAL VALVE                TRICUSPID VALVE MV Area (PHT): 1.66 cm     TR Peak grad:   59.9 mmHg MV Decel Time: 458 msec     TR Vmax:        387.00 cm/s MV E velocity: 87.40 cm/s MV A velocity: 113.00 cm/s  SHUNTS MV E/A ratio:  0.77         Systemic VTI:  0.21 m                             Systemic Diam: 1.80 cm Rozann Lesches MD Electronically signed by Rozann Lesches MD Signature Date/Time: 01/13/2021/2:53:42 PM    Final    VAS [place] UPPER EXTREMITY VENOUS DUPLEX  Result Date: 01/13/2021 UPPER VENOUS STUDY  Patient Name:  TAYTE MCWHERTER  Date of Exam:   01/13/2021 Medical Rec #: [id number]         Accession #:    [id number] Date of Birth: 05-12-1961         Patient Gender: M Patient Age:   59Y Exam Location:  [facility] Procedure:      VAS [place] UPPER EXTREMITY VENOUS DUPLEX Referring Phys: [id number] JARED M GARDNER --------------------------------------------------------------------------------  Indications: Pain, and Swelling Risk Factors: Surgery - Recent ligation of radiocephalic AVF and creation of brachiocephalic AVF Recent fall with skin tear to LUE. Comparison Study: Previous 07/03/20 - negative Performing Technologist: Rogelia Rohrer  Examination Guidelines: A complete evaluation includes B-mode imaging, spectral Doppler, color Doppler, and power Doppler as needed of all accessible portions of each vessel. Bilateral testing is considered an integral part of a complete examination. Limited examinations for reoccurring indications may be performed as noted.  Right Findings: +----------+------------+---------+-----------+----------+-------+ RIGHT  CompressiblePhasicitySpontaneousPropertiesSummary +----------+------------+---------+-----------+----------+-------+ Subclavian    Full       Yes       Yes                       +----------+------------+---------+-----------+----------+-------+  Left Findings: +----------+------------+---------+-----------+----------+-------+ LEFT      CompressiblePhasicitySpontaneousPropertiesSummary +----------+------------+---------+-----------+----------+-------+ IJV           Full       Yes       Yes                      +----------+------------+---------+-----------+----------+-------+ Subclavian    Full       Yes       Yes                      +----------+------------+---------+-----------+----------+-------+ Axillary      Full       Yes       Yes                      +----------+------------+---------+-----------+----------+-------+ Brachial      Full       Yes       Yes                      +----------+------------+---------+-----------+----------+-------+ Radial        Full                                          +----------+------------+---------+-----------+----------+-------+ Ulnar         Full                                          +----------+------------+---------+-----------+----------+-------+ Cephalic      Full                                          +----------+------------+---------+-----------+----------+-------+ Basilic       Full       Yes       Yes                      +----------+------------+---------+-----------+----------+-------+ Patient negative for DVT however there is concern for disfunction of AVF versus arterial disease. Hand is cold to the touch with decreased arterial flow in forearm. would recommend vascular consult and further imaging.  Summary:  Right: No evidence of thrombosis in the subclavian.  Left: No evidence of deep vein thrombosis in the upper extremity. No evidence of superficial vein thrombosis in the upper extremity.  *See table(s) above for measurements and observations.  Vascular consult recommended. Diagnosing physician: Servando Snare MD Electronically signed by Servando Snare MD on  01/13/2021 at 12:06:47 PM.    Final     Microbiology: Recent Results (from the past 240 hour(s))  SARS CORONAVIRUS 2 (TAT 6-24 HRS) Nasopharyngeal Nasopharyngeal Swab     Status: None   Collection Time: 01/08/21  1:32 PM   Specimen: Nasopharyngeal Swab  Result Value Ref Range Status   SARS Coronavirus 2 NEGATIVE NEGATIVE Final    Comment: (NOTE) SARS-CoV-2 target nucleic acids are NOT DETECTED.  The SARS-CoV-2 RNA is generally  detectable in upper and lower respiratory specimens during the acute phase of infection. Negative results do not preclude SARS-CoV-2 infection, do not rule out co-infections with other pathogens, and should not be used as the sole basis for treatment or other patient management decisions. Negative results must be combined with clinical observations, patient history, and epidemiological information. The expected result is Negative.  Fact Sheet for Patients: SugarRoll.be  Fact Sheet for Healthcare Providers: https://www.woods-mathews.com/  This test is not yet approved or cleared by the [place] FDA and  has been authorized for detection and/or diagnosis of SARS-CoV-2 by FDA under an Emergency Use Authorization (EUA). This EUA will remain  in effect (meaning this test can be used) for the duration of the COVID-19 declaration under Se ction 564(b)(1) of the Act, 21 U.S.C. section 360bbb-3(b)(1), unless the authorization is terminated or revoked sooner.  Performed at [facility], [place] [address], [place], [place] [address]   Resp Panel by RT-PCR (Flu A&B, Covid) Nasopharyngeal Swab     Status: None   Collection Time: 01/12/21  5:49 PM   Specimen: Nasopharyngeal Swab; Nasopharyngeal(NP) swabs in vial transport medium  Result Value Ref Range Status   SARS Coronavirus 2 by RT PCR NEGATIVE NEGATIVE Final    Comment: (NOTE) SARS-CoV-2 target nucleic acids are NOT DETECTED.  The SARS-CoV-2 RNA is generally  detectable in upper respiratory specimens during the acute phase of infection. The lowest concentration of SARS-CoV-2 viral copies this assay can detect is 138 copies/mL. A negative result does not preclude SARS-Cov-2 infection and should not be used as the sole basis for treatment or other patient management decisions. A negative result may occur with  improper specimen collection/handling, submission of specimen other than nasopharyngeal swab, presence of viral mutation(s) within the areas targeted by this assay, and inadequate number of viral copies(<138 copies/mL). A negative result must be combined with clinical observations, patient history, and epidemiological information. The expected result is Negative.  Fact Sheet for Patients:  EntrepreneurPulse.com.au  Fact Sheet for Healthcare Providers:  IncredibleEmployment.be  This test is no t yet approved or cleared by the [place] FDA and  has been authorized for detection and/or diagnosis of SARS-CoV-2 by FDA under an Emergency Use Authorization (EUA). This EUA will remain  in effect (meaning this test can be used) for the duration of the COVID-19 declaration under Section 564(b)(1) of the Act, 21 U.S.C.section 360bbb-3(b)(1), unless the authorization is terminated  or revoked sooner.       Influenza A by PCR NEGATIVE NEGATIVE Final   Influenza B by PCR NEGATIVE NEGATIVE Final    Comment: (NOTE) The Xpert Xpress SARS-CoV-2/FLU/RSV plus assay is intended as an aid in the diagnosis of influenza from Nasopharyngeal swab specimens and should not be used as a sole basis for treatment. Nasal washings and aspirates are unacceptable for Xpert Xpress SARS-CoV-2/FLU/RSV testing.  Fact Sheet for Patients: EntrepreneurPulse.com.au  Fact Sheet for Healthcare Providers: IncredibleEmployment.be  This test is not yet approved or cleared by the [place] FDA  and has been authorized for detection and/or diagnosis of SARS-CoV-2 by FDA under an Emergency Use Authorization (EUA). This EUA will remain in effect (meaning this test can be used) for the duration of the COVID-19 declaration under Section 564(b)(1) of the Act, 21 U.S.C. section 360bbb-3(b)(1), unless the authorization is terminated or revoked.  Performed at [facility], [place] [address] [place], [place] [address]   MRSA PCR Screening     Status: None  Collection Time: 01/13/21  7:10 AM   Specimen: Nasal Mucosa; Nasopharyngeal  Result Value Ref Range Status   MRSA by PCR NEGATIVE NEGATIVE Final    Comment:        The GeneXpert MRSA Assay (FDA approved for NASAL specimens only), is one component of a comprehensive MRSA colonization surveillance program. It is not intended to diagnose MRSA infection nor to guide or monitor treatment for MRSA infections. Performed at [facility], [place] [address] [place], [place] [address]      Labs: Basic Metabolic Panel: Recent Labs  Lab 01/12/21 1658 01/12/21 1704 01/12/21 1744 01/13/21 0459 01/14/21 0627 01/15/21 0354  NA 137 134* 135 134* 135 135  K 5.1 4.8 4.8 3.4* 4.4 4.5  CL 98 98  --  92* 94* 93*  CO2 25  --   --  32 31 27  GLUCOSE 88 85  --  88 90 94  BUN 58* 59*  --  14 29* 43*  CREATININE 8.63* 8.60*  --  3.45* 6.19* 7.62*  CALCIUM 9.6  --   --  9.6 9.1 9.0  PHOS  --   --   --   --  6.5* 7.5*   Liver Function Tests: Recent Labs  Lab 01/12/21 1658 01/14/21 0627 01/15/21 0354  AST 19  --   --   ALT <5  --   --   ALKPHOS 71  --   --   BILITOT 1.4*  --   --   PROT 6.4*  --   --   ALBUMIN 3.5 3.1* 3.2*   No results for input(s): LIPASE, AMYLASE in the last 168 hours. No results for input(s): AMMONIA in the last 168 hours. CBC: Recent Labs  Lab 01/12/21 1658 01/12/21 1704 01/12/21 1744 01/13/21 0459 01/14/21 0627 01/15/21 0354  WBC 6.5  --   --  4.6 4.1 4.8  NEUTROABS 5.4  --   --   --  3.0  3.5  HGB 9.5* 9.5* 9.9* 9.7* 9.7* 9.8*  HCT 29.9* 28.0* 29.0* 30.4* 30.6* 30.8*  MCV 111.6*  --   --  108.2* 110.5* 110.8*  PLT 183  --   --  165 180 181   Cardiac Enzymes: Recent Labs  Lab 01/12/21 1658  CKTOTAL 42*   BNP: BNP (last 3 results) Recent Labs    01/23/20 1945 02/08/20 1640 09/18/20 0635  BNP 4,186.6* 4,270.9* 4,033.1*    ProBNP (last 3 results) No results for input(s): PROBNP in the last 8760 hours.  CBG: Recent Labs  Lab 01/10/21 1039 01/12/21 1847  GLUCAP 211* 88       Signed:  Alma Friendly, MD [facility] 01/15/2021, 1:09 PM

## 2021-01-15 NOTE — Progress Notes (Signed)
Thomas Robinson to be discharged Home per MD order. Discussed prescriptions and follow up appointments with the patient. Prescriptions given to patient; medication list explained in detail. Patient verbalized understanding.  Skin clean, dry and intact without evidence of skin break down, no evidence of skin tears noted. IV catheter discontinued intact. Site without signs and symptoms of complications. Dressing and pressure applied. Pt denies pain at the site currently. No complaints noted.  Patient free of lines, drains, and wounds.   An After Visit Summary (AVS) was printed and given to the patient. Patient escorted via wheelchair, and discharged home via private auto.  Amaryllis Dyke, RN

## 2021-01-15 NOTE — Progress Notes (Addendum)
Progress Note  Patient Name: Thomas Robinson Date of Encounter: 01/15/2021  [facility] Cardiologist: [name], DO   Subjective   Pt upset and threatening to leave AMA. I assured him he had a choice. No dizziness, lightheadedness, no CP or SOB. He remains on [place] O2 - does not take O2 at home.  Inpatient Medications    Scheduled Meds: . amLODipine  10 mg Oral Daily  . Chlorhexidine Gluconate Cloth  6 each Topical Q0600  . cholecalciferol  1,000 Units Oral Daily  . cinacalcet  60 mg Oral Q breakfast  . [START ON 01/16/2021] doxercalciferol  1 mcg Intravenous Q M,W,F-HD  . ferric citrate  420 mg Oral TID WC  . fluticasone furoate-vilanterol  1 puff Inhalation Daily  . heparin  5,000 Units Subcutaneous Q8H  . hydrALAZINE  50 mg Oral BID  . mouth rinse  15 mL Mouth Rinse BID  . metoprolol tartrate  50 mg Oral BID  . multivitamin  1 tablet Oral Daily  . sevelamer carbonate  800 mg Oral Q breakfast   Continuous Infusions:  PRN Meds: acetaminophen **OR** acetaminophen, albuterol, gabapentin, HYDROcodone-acetaminophen, ondansetron **OR** ondansetron (ZOFRAN) IV, oxyCODONE, pregabalin, traMADol   Vital Signs    Vitals:   01/14/21 2007 01/14/21 2116 01/15/21 0500 01/15/21 0838  BP: 122/64  113/62   Pulse: (!) 55 (!) 55 (!) 53   Resp: 16  16   Temp: 98.2 F (36.8 C)  (!) 97.4 F (36.3 C)   TempSrc: Oral  Oral   SpO2: 90%  100% 99%  Weight:      Height:        Intake/Output Summary (Last 24 hours) at 01/15/2021 0937 Last data filed at 01/14/2021 1700 Gross per 24 hour  Intake 600 ml  Output 0 ml  Net 600 ml   Last 3 Weights 01/12/2021 01/12/2021 01/10/2021  Weight (lbs) 133 lb 9.6 oz 130 lb 1.1 oz 130 lb  Weight (kg) 60.6 kg 59 kg 58.968 kg      Telemetry    Sinus bradycardia in the 50s - Personally Reviewed  ECG    No new tracings - Personally Reviewed  Physical Exam   GEN: No acute distress.   Neck: No JVD Cardiac: RRR, no murmurs, rubs, or gallops.   Respiratory: Clear to auscultation bilaterally. GI: Soft, nontender, non-distended  MS: left arm wrapped Neuro:  Nonfocal  Psych: Normal affect   Labs    High Sensitivity Troponin:   Recent Labs  Lab 01/14/21 0627 01/14/21 0812  TROPONINIHS 408* 419*      Chemistry Recent Labs  Lab 01/12/21 1658 01/12/21 1704 01/13/21 0459 01/14/21 0627 01/15/21 0354  NA 137   < > 134* 135 135  K 5.1   < > 3.4* 4.4 4.5  CL 98   < > 92* 94* 93*  CO2 25  --  32 31 27  GLUCOSE 88   < > 88 90 94  BUN 58*   < > 14 29* 43*  CREATININE 8.63*   < > 3.45* 6.19* 7.62*  CALCIUM 9.6  --  9.6 9.1 9.0  PROT 6.4*  --   --   --   --   ALBUMIN 3.5  --   --  3.1* 3.2*  AST 19  --   --   --   --   ALT <5  --   --   --   --   ALKPHOS 71  --   --   --   --  BILITOT 1.4*  --   --   --   --   GFRNONAA 6*  --  19* 10* 7*  ANIONGAP 14  --  10 10 15    < > = values in this interval not displayed.     Hematology Recent Labs  Lab 01/13/21 0459 01/14/21 0627 01/15/21 0354  WBC 4.6 4.1 4.8  RBC 2.81* 2.77* 2.78*  HGB 9.7* 9.7* 9.8*  HCT 30.4* 30.6* 30.8*  MCV 108.2* 110.5* 110.8*  MCH 34.5* 35.0* 35.3*  MCHC 31.9 31.7 31.8  RDW 15.6* 16.1* 16.1*  PLT 165 180 181    BNPNo results for input(s): BNP, PROBNP in the last 168 hours.   DDimer No results for input(s): DDIMER in the last 168 hours.   Radiology    DG CHEST PORT 1 VIEW  Result Date: 01/14/2021 CLINICAL DATA:  Follow-up EXAM: PORTABLE CHEST - 1 VIEW COMPARISON:  01/12/2021 FINDINGS: The mediastinal contours are within normal limits. Unchanged moderate cardiomegaly. Improved aeration of the lungs bilaterally with mild persistent scattered peribronchovascular patchy opacities. No significant pleural effusion or pneumothorax. Atherosclerotic calcification of the aortic arch. Multilevel cervicothoracic anterior fusion hardware remains in place without complicating feature. No acute osseous abnormality. IMPRESSION: 1. Improved aeration  bilaterally compatible with improving mild pulmonary edema. 2. Stable moderate cardiomegaly. 3.  Aortic Atherosclerosis (ICD10-I70.0). Electronically Signed   By: Ruthann Cancer MD   On: 01/14/2021 08:23   ECHOCARDIOGRAM COMPLETE  Result Date: 01/13/2021    ECHOCARDIOGRAM REPORT   Patient Name:   Thomas Robinson Date of Exam: 01/13/2021 Medical Rec #:  [id number]        Height:       68.0 in Accession #:    [id number]       Weight:       133.6 lb Date of Birth:  1961/08/24        BSA:          1.722 m Patient Age:    60 years         BP:           118/73 mmHg Patient Gender: M                HR:           78 bpm. Exam Location:  Inpatient Procedure: 2D Echo, 3D Echo, Cardiac Doppler and Color Doppler Indications:    Syncope R55  History:        Patient has prior history of Echocardiogram examinations, most                 recent 05/17/2019. CHF, Pulmonary HTN; Risk Factors:Hypertension                 and Dyslipidemia. End stage renal disease, Macrocytic anemia.                 GERD. Thoracic ascending aortic aneurysm 4.5 by CT.  Sonographer:    Darlina Sicilian RDCS Referring Phys: [id number] [place]  Sonographer Comments: Global longitudinal strain was attempted. IMPRESSIONS  1. Left ventricular ejection fraction, by estimation, is 60 to 65%. The left ventricle has normal function. The left ventricle has no regional wall motion abnormalities. There is moderate concentric left ventricular hypertrophy. Left ventricular diastolic parameters are consistent with Grade I diastolic dysfunction (impaired relaxation).  2. Right ventricular systolic function is mildly reduced. The right ventricular size is mildly enlarged. There is severely elevated pulmonary artery systolic pressure. The estimated right  ventricular systolic pressure is 62.1 mmHg.  3. There is a trivial pericardial effusion posterior to the left ventricle.  4. The mitral valve is abnormal. Mild mitral valve regurgitation. Moderate mitral annular  calcification.  5. Tricuspid valve regurgitation is moderate.  6. The aortic valve is tricuspid. There is moderate calcification of the aortic valve. Aortic valve regurgitation is mild. Mild to moderate aortic valve stenosis. Aortic valve mean gradient measures 12.0 mmHg. Dimentionless index 0.58.  7. Aortic dilatation noted. There is mild dilatation of the ascending aorta, measuring 41 mm.  8. The inferior vena cava is dilated in size with <50% respiratory variability, suggesting right atrial pressure of 15 mmHg. FINDINGS  Left Ventricle: Left ventricular ejection fraction, by estimation, is 60 to 65%. The left ventricle has normal function. The left ventricle has no regional wall motion abnormalities. The left ventricular internal cavity size was small. There is moderate  concentric left ventricular hypertrophy. Left ventricular diastolic parameters are consistent with Grade I diastolic dysfunction (impaired relaxation). Right Ventricle: The right ventricular size is mildly enlarged. Right vetricular wall thickness was not assessed. Right ventricular systolic function is mildly reduced. There is severely elevated pulmonary artery systolic pressure. The tricuspid regurgitant velocity is 3.87 m/s, and with an assumed right atrial pressure of 15 mmHg, the estimated right ventricular systolic pressure is 30.8 mmHg. Left Atrium: Left atrial size was normal in size. Right Atrium: Right atrial size was normal in size. Pericardium: Trivial pericardial effusion is present. The pericardial effusion is posterior to the left ventricle. Mitral Valve: The mitral valve is abnormal. There is mild calcification of the mitral valve leaflet(s). Moderate mitral annular calcification. Mild mitral valve regurgitation. Tricuspid Valve: The tricuspid valve is grossly normal. Tricuspid valve regurgitation is moderate. Aortic Valve: The aortic valve is tricuspid. There is moderate calcification of the aortic valve. There is mild aortic  valve annular calcification. Aortic valve regurgitation is mild. Aortic regurgitation PHT measures 325 msec. Mild to moderate aortic stenosis is present. Aortic valve mean gradient measures 12.0 mmHg. Aortic valve peak gradient measures 23.2 mmHg. Aortic valve area, by VTI measures 1.46 cm. Pulmonic Valve: The pulmonic valve was grossly normal. Pulmonic valve regurgitation is trivial. Aorta: The aortic root is normal in size and structure and aortic dilatation noted. There is mild dilatation of the ascending aorta, measuring 41 mm. Venous: The inferior vena cava is dilated in size with less than 50% respiratory variability, suggesting right atrial pressure of 15 mmHg. IAS/Shunts: No atrial level shunt detected by color flow Doppler.  LEFT VENTRICLE PLAX 2D LVIDd:         3.80 cm     Diastology LVIDs:         2.40 cm     LV e' medial:    3.44 cm/s LV PW:         1.00 cm     LV E/e' medial:  25.4 LV IVS:        1.10 cm     LV e' lateral:   4.26 cm/s LVOT diam:     1.80 cm     LV E/e' lateral: 20.5 LV SV:         54 LV SV Index:   32 LVOT Area:     2.54 cm  LV Volumes (MOD) LV vol d, MOD A2C: 74.7 ml LV vol d, MOD A4C: 67.4 ml LV vol s, MOD A2C: 43.2 ml LV vol s, MOD A4C: 33.1 ml LV SV MOD A2C:  31.5 ml LV SV MOD A4C:     67.4 ml LV SV MOD BP:      34.2 ml RIGHT VENTRICLE RV S prime:     14.90 cm/s TAPSE (M-mode): 2.0 cm LEFT ATRIUM             Index       RIGHT ATRIUM           Index LA diam:        2.80 cm 1.63 cm/m  RA Area:     18.70 cm LA Vol (A2C):   66.2 ml 38.45 ml/m RA Volume:   52.80 ml  30.67 ml/m LA Vol (A4C):   40.7 ml 23.64 ml/m LA Biplane Vol: 57.9 ml 33.63 ml/m  AORTIC VALVE AV Area (Vmax):    1.26 cm AV Area (Vmean):   1.31 cm AV Area (VTI):     1.46 cm AV Vmax:           241.00 cm/s AV Vmean:          156.000 cm/s AV VTI:            0.372 m AV Peak Grad:      23.2 mmHg AV Mean Grad:      12.0 mmHg LVOT Vmax:         119.00 cm/s LVOT Vmean:        80.300 cm/s LVOT VTI:          0.214 m  LVOT/AV VTI ratio: 0.58 AI PHT:            325 msec  AORTA Ao Root diam: 3.70 cm Ao Asc diam:  4.10 cm MITRAL VALVE                TRICUSPID VALVE MV Area (PHT): 1.66 cm     TR Peak grad:   59.9 mmHg MV Decel Time: 458 msec     TR Vmax:        387.00 cm/s MV E velocity: 87.40 cm/s MV A velocity: 113.00 cm/s  SHUNTS MV E/A ratio:  0.77         Systemic VTI:  0.21 m                             Systemic Diam: 1.80 cm Rozann Lesches MD Electronically signed by Rozann Lesches MD Signature Date/Time: 01/13/2021/2:53:42 PM    Final    VAS [place] UPPER EXTREMITY VENOUS DUPLEX  Result Date: 01/13/2021 UPPER VENOUS STUDY  Patient Name:  ARGIL MAHL  Date of Exam:   01/13/2021 Medical Rec #: [id number]         Accession #:    [id number] Date of Birth: [birth year]         Patient Gender: M Patient Age:   59Y Exam Location:  [facility] Procedure:      VAS [place] UPPER EXTREMITY VENOUS DUPLEX Referring Phys: [id number] JARED M GARDNER --------------------------------------------------------------------------------  Indications: Pain, and Swelling Risk Factors: Surgery - Recent ligation of radiocephalic AVF and creation of brachiocephalic AVF Recent fall with skin tear to LUE. Comparison Study: Previous 07/03/20 - negative Performing Technologist: Rogelia Rohrer  Examination Guidelines: A complete evaluation includes B-mode imaging, spectral Doppler, color Doppler, and power Doppler as needed of all accessible portions of each vessel. Bilateral testing is considered an integral part of a complete examination. Limited examinations for reoccurring indications may be performed as noted.  Right Findings: +----------+------------+---------+-----------+----------+-------+ RIGHT  CompressiblePhasicitySpontaneousPropertiesSummary +----------+------------+---------+-----------+----------+-------+ Subclavian    Full       Yes       Yes                      +----------+------------+---------+-----------+----------+-------+   Left Findings: +----------+------------+---------+-----------+----------+-------+ LEFT      CompressiblePhasicitySpontaneousPropertiesSummary +----------+------------+---------+-----------+----------+-------+ IJV           Full       Yes       Yes                      +----------+------------+---------+-----------+----------+-------+ Subclavian    Full       Yes       Yes                      +----------+------------+---------+-----------+----------+-------+ Axillary      Full       Yes       Yes                      +----------+------------+---------+-----------+----------+-------+ Brachial      Full       Yes       Yes                      +----------+------------+---------+-----------+----------+-------+ Radial        Full                                          +----------+------------+---------+-----------+----------+-------+ Ulnar         Full                                          +----------+------------+---------+-----------+----------+-------+ Cephalic      Full                                          +----------+------------+---------+-----------+----------+-------+ Basilic       Full       Yes       Yes                      +----------+------------+---------+-----------+----------+-------+ Patient negative for DVT however there is concern for disfunction of AVF versus arterial disease. Hand is cold to the touch with decreased arterial flow in forearm. would recommend vascular consult and further imaging.  Summary:  Right: No evidence of thrombosis in the subclavian.  Left: No evidence of deep vein thrombosis in the upper extremity. No evidence of superficial vein thrombosis in the upper extremity.  *See table(s) above for measurements and observations.  Vascular consult recommended. Diagnosing physician: Servando Snare MD Electronically signed by Servando Snare MD on 01/13/2021 at 12:06:47 PM.    Final     Cardiac Studies   Echo  01/13/21: 1. Left ventricular ejection fraction, by estimation, is 60 to 65%. The  left ventricle has normal function. The left ventricle has no regional  wall motion abnormalities. There is moderate concentric left ventricular  hypertrophy. Left ventricular  diastolic parameters are consistent with Grade I diastolic dysfunction  (impaired relaxation).  2. Right ventricular systolic function is mildly reduced. The right  ventricular size is mildly enlarged. There is severely elevated pulmonary  artery systolic pressure. The estimated right ventricular systolic  pressure is 10.6 mmHg.  3. There is a trivial pericardial effusion posterior to the left  ventricle.  4. The mitral valve is abnormal. Mild mitral valve regurgitation.  Moderate mitral annular calcification.  5. Tricuspid valve regurgitation is moderate.  6. The aortic valve is tricuspid. There is moderate calcification of the  aortic valve. Aortic valve regurgitation is mild. Mild to moderate aortic  valve stenosis. Aortic valve mean gradient measures 12.0 mmHg.  Dimentionless index 0.58.  7. Aortic dilatation noted. There is mild dilatation of the ascending  aorta, measuring 41 mm.  8. The inferior vena cava is dilated in size with <50% respiratory  variability, suggesting right atrial pressure of 15 mmHg.   Patient Profile     60 y.o. male  with a hx of ESRD with dialysis T,TH,sat, HTN, asthma, CVA, CAD and diastolic HF, anemia of chronic disease, prox ascending aorta dilatation measuring 4.5 cm 10/20 and same on CTA of chest in Dec 2021, pt has been non compliant. Cards consulted for elevated troponin.   Assessment & Plan    Elevated troponin - consistent with demand ischemia in the setting of being found down and missed HD sessions - hs troponin flat in the 400 range - reassuring heart cath in Dec 2021 - no further ischemic evaluation this admission   Elevated pulmonary pressure on echo - suspect this will  improve with HD - repeat limited echo OP once more euvolmmic   Bradycardia - metoprolol was reduced to 50 mg BID - HR in the 50s - Afib tele alarm reviewed and appears sinus bradycardia   ESRD-HD - per nephrology   I have messaged office for a hospital follow up appt in 2-3 weeks with Dr. Harriet Masson.   For questions or updates, please contact [place] Please consult [email or web address] for contact info under        Signed, Ledora Bottcher, PA  01/15/2021, 9:37 AM

## 2021-01-15 NOTE — TOC Initial Note (Signed)
Transition of Care [facility]) - Initial/Assessment Note    Patient Details  Name: Thomas Robinson MRN: [id number] Date of Birth: [birth year]  [facility] CM/SW Contact:    Zenon Mayo, RN Phone Number: 01/15/2021, 2:16 PM  Clinical Narrative:                 Patient is for dc today, NCM asked if could set up HHPT for him, he states no he does not want that, NCM asked if he would like outpatient PT, he states he does not want that either, he is fine.  He states he has transportation home today. NCM asked if there was anything we could assist him with, he states no.    Expected Discharge Plan: Home/Self Care Barriers to Discharge: No Barriers Identified   Patient Goals and CMS Choice Patient states their goals for this hospitalization and ongoing recovery are:: return home and get a hot bath   Choice offered to / list presented to : NA  Expected Discharge Plan and Services Expected Discharge Plan: Home/Self Care   Discharge Planning Services: CM Consult Post Acute Care Choice: NA Living arrangements for the past 2 months: Single Family Home Expected Discharge Date: 01/15/21                 DME Agency: NA       HH Arranged: NA,Refused HH          Prior Living Arrangements/Services Living arrangements for the past 2 months: Single Family Home Lives with:: Relatives Patient language and need for interpreter reviewed:: Yes Do you feel safe going back to the place where you live?: Yes            Criminal Activity/Legal Involvement Pertinent to Current Situation/Hospitalization: No - Comment as needed  Activities of Daily Living Home Assistive Devices/Equipment: None ADL Screening (condition at time of admission) Patient's cognitive ability adequate to safely complete daily activities?: Yes Is the patient deaf or have difficulty hearing?: No Does the patient have difficulty seeing, even when wearing glasses/contacts?: No Does the patient have  difficulty concentrating, remembering, or making decisions?: No Patient able to express need for assistance with ADLs?: Yes Does the patient have difficulty dressing or bathing?: Yes Independently performs ADLs?: Yes (appropriate for developmental age) Does the patient have difficulty walking or climbing stairs?: No Weakness of Legs: Both Weakness of Arms/Hands: None  Permission Sought/Granted                  Emotional Assessment Appearance:: Appears stated age Attitude/Demeanor/Rapport: Engaged Affect (typically observed): Appropriate Orientation: : Oriented to Self,Oriented to Place,Oriented to  Time,Oriented to Situation Alcohol / Substance Use: Not Applicable Psych Involvement: No (comment)  Admission diagnosis:  Pain [R52] Acute respiratory failure with hypoxia ([place]) [J96.01] ESRD on hemodialysis ([place]) [N18.6, Z99.2] Concussion without loss of consciousness, initial encounter [S06.0X0A] Fall, initial encounter [W19.XXXA] Acute on chronic respiratory failure with hypoxia (H[facility]) [J96.21] Skin tear of left forearm without complication, initial encounter [S51.812A] Acute on chronic respiratory failure ([place]) [J96.20] Patient Active Problem List   Diagnosis Date Noted  . Acute on chronic respiratory failure ([place]) 01/13/2021  . Acute on chronic respiratory failure with hypoxia ([place]) 01/12/2021  . Age-related physical debility 09/20/2020  . Allergy, unspecified, initial encounter 09/20/2020  . Nausea 09/20/2020  . Pain, unspecified 09/20/2020  . Pruritus, unspecified 09/20/2020  . Acute encephalopathy 08/26/2020  . Peripheral neuropathy 08/26/2020  . Elevated troponin 08/25/2020  . Hyperkalemia 08/05/2020  .  Arthritis   . Asthma   . Cataract   . Cerebral infarction due to thrombosis of cerebral artery ([place])   . History of CVA (cerebrovascular accident)   . Chronic kidney disease   . Dyspnea   . Hyperlipidemia   . Hypertension   . Kidney failure   . Restless leg  syndrome   . Degenerative lumbar spinal stenosis 06/26/2020  . Diastolic dysfunction 67/54/4920  . Renovascular hypertension 06/22/2020  . Demand ischemia ([place]) 06/18/2020  . LVH (left ventricular hypertrophy) due to hypertensive disease, with heart failure ([place]) 06/18/2020  . Myofascial pain syndrome 06/12/2020  . Encounter for removal of sutures 03/29/2020  . History of fusion of cervical spine 03/26/2020  . Non-compliance with renal dialysis ([place]) 02/08/2020  . Acute pulmonary edema ([place]) 01/23/2020  . Cervical disc herniation 01/04/2020  . Tachycardia 01/04/2020  . SOB (shortness of breath)   . Oxygen deficiency 12/30/2019  . Chronic low back pain 11/24/2019  . Degeneration of lumbar intervertebral disc 11/24/2019  . Lumbar radiculopathy 11/24/2019  . Left shoulder pain 11/11/2019  . Anaphylactic shock, unspecified, sequela 06/10/2019  . Rib fractures 06/05/2019  . Fall at home, initial encounter 06/04/2019  . Right rib fracture 06/04/2019  . Lung contusion 06/04/2019  . Acute respiratory failure with hypoxia ([place]) 06/04/2019  . Anemia in ESRD (end-stage renal disease) ([place]) 06/04/2019  . GERD (gastroesophageal reflux disease) 06/04/2019  . Chronic diastolic (congestive) heart failure ([place]) 06/04/2019  . Fall 06/04/2019  . Thoracic ascending aortic aneurysm ([place]) 06/04/2019  . Acute exacerbation of congestive heart failure ([place]) 05/16/2019  . Carpal tunnel syndrome of right wrist 10/05/2018  . Ulnar neuropathy 10/05/2018  . Neuritis of right ulnar nerve 09/13/2018  . Pain in joint of right elbow 09/13/2018  . Chest pain 04/13/2018  . Chronic diastolic heart failure ([place]) 04/13/2018  . Acute on chronic diastolic CHF (congestive heart failure) ([place]) 04/13/2018  . Chest discomfort   . Atypical chest pain 02/16/2018  . ESRD on dialysis ([place]) 02/16/2018  . ESRD (end stage renal disease) on dialysis ([place]) 02/16/2018  . Volume overload 10/23/2017  . CAP (community acquired  pneumonia) 10/23/2017  . Asthma, chronic, unspecified asthma severity, with acute exacerbation 10/23/2017  . Respiratory failure, acute ([place]) 10/23/2017  . Iron deficiency anemia, unspecified 09/09/2017  . Gram-negative sepsis, unspecified ([place]) 09/05/2017  . Cough 08/23/2017  . Encounter for immunization 07/08/2017  . Moderate protein-calorie malnutrition ([place]) 06/06/2017  . Coagulation defect, unspecified ([place]) 06/04/2017  . Hypokalemia 06/04/2017  . Other hyperlipidemia 06/04/2017  . Secondary hyperparathyroidism of renal origin ([place]) 06/04/2017  . Constipation   . Macrocytic anemia 05/24/2017  . Uremia 05/24/2017  . Hypertensive urgency 05/24/2017  . Acute kidney injury superimposed on CKD ([place]) 05/24/2017  . CKD (chronic kidney disease), stage V ([place]) 05/24/2017  . GI bleeding 05/24/2017  . Symptomatic anemia 05/24/2017  . Stroke ([place]) 2018  . AKI (acute kidney injury) ([place]) 04/10/2015  . History of completed stroke   . Essential hypertension 04/09/2015   PCP:  [name], PA Pharmacy:   [id number] [address], [place] [address] Phone: [phone] Fax: [phone]     Social Determinants of Health ([place]) Interventions    Readmission Risk Interventions Readmission Risk Prevention Plan 01/15/2021 09/11/2020  Transportation Screening Complete Complete  Medication Review (RN Care Manager) Complete -  PCP or Specialist appointment within 3-5 days of discharge - Not Complete  PCP/Specialist Appt Not Complete comments -  first available appt is 09/26/20  HRI or Home Care Consult Complete (No Data)  SW Recovery Care/Counseling Consult Complete Complete  Palliative Care Screening Not Applicable Not Applicable  Skilled Nursing Facility Not Applicable Not Applicable  Some recent data might be hidden

## 2021-01-15 NOTE — Interval H&P Note (Signed)
History and Physical Interval Note:  01/15/2021 11:12 AM  Thomas Robinson  has presented today for surgery, with the diagnosis of instage renal.  The various methods of treatment have been discussed with the patient and family. After consideration of risks, benefits and other options for treatment, the patient has consented to  Procedure(s): A/V FISTULAGRAM (N/A) as a surgical intervention.  The patient's history has been reviewed, patient examined, no change in status, stable for surgery.  I have reviewed the patient's chart and labs.  Questions were answered to the patient's satisfaction.     [name]

## 2021-01-16 ENCOUNTER — Telehealth: Payer: Self-pay | Admitting: Nephrology

## 2021-01-16 MED FILL — Midazolam HCl Inj 5 MG/5ML (Base Equivalent): INTRAMUSCULAR | Qty: 1 | Status: AC

## 2021-01-16 MED FILL — Heparin Sod (Porcine)-NaCl IV Soln 1000 Unit/500ML-0.9%: INTRAVENOUS | Qty: 500 | Status: AC

## 2021-01-16 NOTE — Telephone Encounter (Signed)
Transition of Care Contact from [facility]  Date of Discharge: 01/15/2021 Date of Contact: 01/16/2021 Method of contact: phone - attempted  Attempted to contact patient to discuss transition of care from inpatient admission.  Patient did not answer the phone.  Message was left on patient's voicemail informing them we would attempt to call them again and if unable to reach will follow up at dialysis.  Jen Mow, PA-C [facility] Pager: [phone]

## 2021-01-21 ENCOUNTER — Other Ambulatory Visit: Payer: Self-pay | Admitting: Physician Assistant

## 2021-01-23 ENCOUNTER — Telehealth: Payer: Self-pay | Admitting: *Deleted

## 2021-01-23 NOTE — Telephone Encounter (Signed)
Pt called to report swollen and painful hand and wrist s/p fistulogram with intervention on 5/17. Pt is also having small amounts of clear drainage from fistulogram access site. He has been added to the PA schedule on Friday.

## 2021-01-25 ENCOUNTER — Ambulatory Visit (INDEPENDENT_AMBULATORY_CARE_PROVIDER_SITE_OTHER): Payer: Medicare Other | Admitting: Physician Assistant

## 2021-01-25 ENCOUNTER — Other Ambulatory Visit: Payer: Self-pay

## 2021-01-25 VITALS — BP 149/76 | HR 81 | Temp 97.3°F | Resp 16 | Ht 68.0 in | Wt 128.4 lb

## 2021-01-25 DIAGNOSIS — Z992 Dependence on renal dialysis: Secondary | ICD-10-CM

## 2021-01-25 DIAGNOSIS — N186 End stage renal disease: Secondary | ICD-10-CM

## 2021-01-25 DIAGNOSIS — M7989 Other specified soft tissue disorders: Secondary | ICD-10-CM | POA: Insufficient documentation

## 2021-01-25 MED ORDER — TRAMADOL HCL 50 MG PO TABS: 50.0000 mg | ORAL_TABLET | Freq: Four times a day (QID) | ORAL | 0 refills | Status: DC | PRN

## 2021-01-25 NOTE — Progress Notes (Signed)
POST OPERATIVE OFFICE NOTE    CC:  F/u for surgery  HPI:  This is a 60 y.o. male who is s/p left forearm radiaocephalic fistula ligation and creation of brachiocephalic fistula by Dr. Trula Slade  on 01/10/21.  He then under went fistulogram with Venoplasty, left subclavian vein.  Pt returns today for follow up.  Pt states They are using the brachiocephalic fistula without difficulty.  He is very frustrated with the continued edema in the forearm.  He states he elevates the left UE often and occasionally the edema goes down.  He reports significant pain in the left forearm.    He denise very and chills. He has plans for spinal surgery next week with Dr. Ree Edman.     No Known Allergies  Current Outpatient Medications  Medication Sig Dispense Refill  . traMADol (ULTRAM) 50 MG tablet Take 1 tablet (50 mg total) by mouth every 6 (six) hours as needed for moderate pain. 20 tablet 0  . acetaminophen (TYLENOL) 500 MG tablet Take 500 mg by mouth every 6 (six) hours as needed for moderate pain or headache.    . albuterol (VENTOLIN HFA) 108 (90 Base) MCG/ACT inhaler Inhale 2 puffs into the lungs every 6 (six) hours as needed for wheezing or shortness of breath.     [name] amLODipine (NORVASC) 10 MG tablet Take 10 mg by mouth daily.     Lorin Picket 1 GM 210 MG(Fe) tablet Take 420 mg by mouth 3 (three) times daily with meals.     [name] BREO ELLIPTA 100-25 MCG/INH AEPB Inhale 1 puff into the lungs daily.    . cholecalciferol (VITAMIN D3) 25 MCG (1000 UNIT) tablet Take 1,000 Units by mouth daily.    . cinacalcet (SENSIPAR) 60 MG tablet Take 60 mg by mouth daily.    [name] DIPHENHYDRAMINE HCL PO Take 25 mg by mouth as needed (allergies).    [name] doxercalciferol (HECTOROL) 0.5 MCG capsule Doxercalciferol (Hectorol)    . furosemide (LASIX) 40 MG tablet Take 1 tablet (40 mg total) by mouth See admin instructions. On Sunday, Monday, Wednesday, Thursday and Saturday (Patient taking differently: Take 40 mg by mouth daily as needed  for fluid.) 60 tablet 2  . gabapentin (NEURONTIN) 300 MG capsule Take 300 mg by mouth 2 (two) times daily as needed (pain).     . hydrALAZINE (APRESOLINE) 25 MG tablet Take 50 mg by mouth in the morning and at bedtime.    . metoprolol tartrate (LOPRESSOR) 100 MG tablet Take 0.5 tablets (50 mg total) by mouth 2 (two) times daily.    . multivitamin (RENA-VIT) TABS tablet Take 1 tablet by mouth daily.    . pregabalin (LYRICA) 25 MG capsule Take 25 mg by mouth daily as needed (pain).    [name] sevelamer carbonate (RENVELA) 800 MG tablet Take 800 mg by mouth daily.    [name] Specialty Vitamins Products (HEALTHY HEART COMPLEX PO) Take 1 tablet by mouth daily.     No current facility-administered medications for this visit.     ROS:  See HPI  Physical Exam:    Incision:  Incisions are well healed Extremities:  Left forearm skin tear with scab.  Forearm into the hand is swollen.  He has a palpable radial pulse, motor of the digits are intact but limited due to edema.  Brisk doppler ulnar and palmer signals.   Upper arm is edema free and the fistula has a good palpable thrill.   Lungs: non labored breathing  Assessment/Plan:  This is a 60 y.o. male who is s/p:left forearm radiaocephalic fistula ligation and creation of brachiocephalic fistula by Dr. Trula Slade  on 01/10/21.  He then under went fistulogram with Venoplasty, left subclavian vein.  I asked Dr. Donzetta Matters to review the chart and examine his arm.  The only option is to ligate the upper arm fistula and place a [facility] for HD.  The patient does not want more surgery at this time.  I placed a moderate compressive ace wrap from the hand to the elbow.  He was instructed to elevate his hand and arm above the level of his heart and to wear the arm slign when [place] to help with elevation.     Roxy Horseman PA-C [facility] [phone]   Clinic MD:  Donzetta Matters

## 2021-01-26 IMAGING — CR DG CHEST 1V PORT
1 series · 1 of 1 positions shown · non-contrast
Comparison: Same day CTA of the chest

CLINICAL DATA: Cough

EXAM:
PORTABLE CHEST 1 VIEW

[AP]
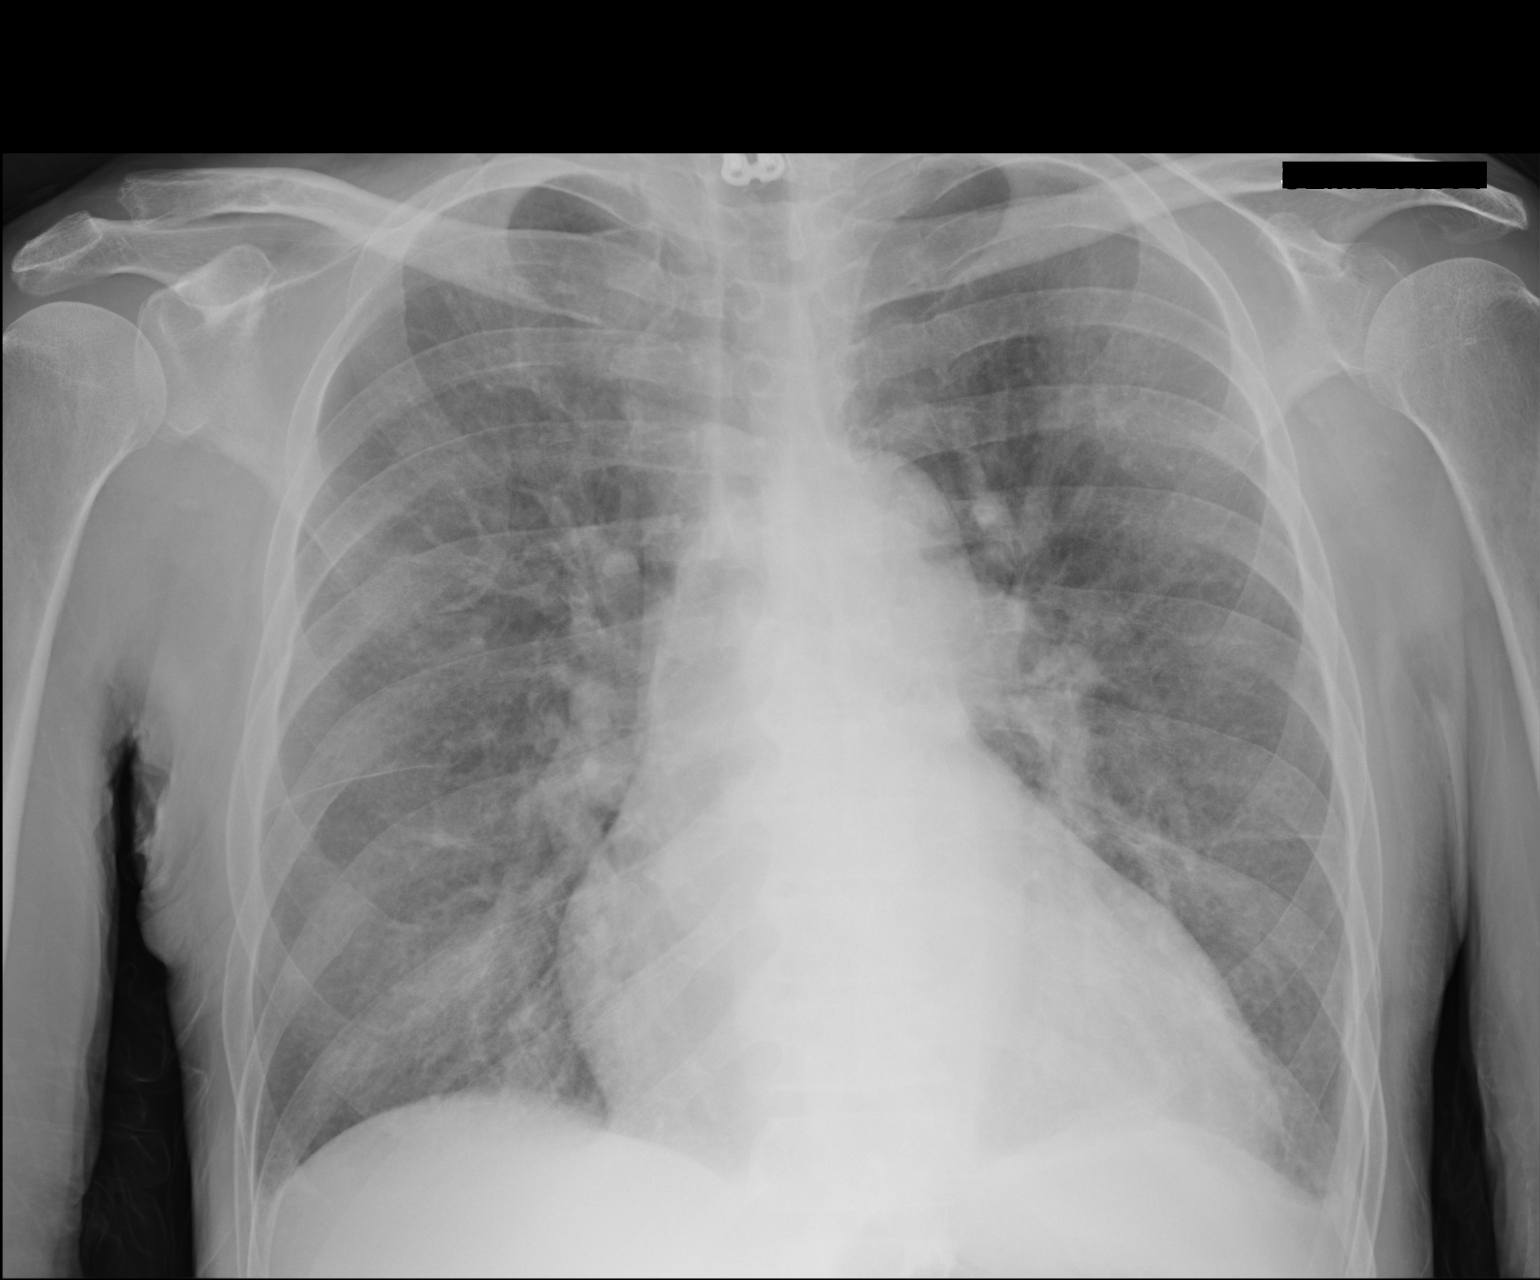

[1 of 1 positions shown; findings below may reference images not displayed]

FINDINGS: Enlargement of cardiac silhouette. Fusiform aneurysmal dilation of
the aorta is better characterized on same day CT. Hazy opacities
throughout both lungs. Linear bibasilar opacities, compatible with
atelectasis. Mild interstitial prominence diffusely. No visible
pleural effusions. No pneumothorax. The visualized skeletal
structures are unremarkable. Cervical ACDF.
IMPRESSION: 1. Hazy opacities throughout both lungs with mild interstitial
prominence, which correlates with the mosaic attenuation,
ground-glass opacities, and trace interstitial edema seen on same
day chest CT.
2. Bibasilar atelectasis.
3. Cardiomegaly. Fusiform aneurysmal dilation of the aorta is better
characterized on same day CT.

## 2021-01-26 IMAGING — CT CT ANGIO CHEST
2 of 6 series · 18 of 46 positions shown · IV contrast (APPLIED)
Comparison: Prior CTA chest 02/08/2020

CLINICAL DATA: 59-year-old male with end-stage renal disease on
hemodialysis. Additionally, he has in ascending thoracic aortic
aneurysm.

EXAM:
CT ANGIOGRAPHY CHEST WITH CONTRAST
TECHNIQUE: Multidetector CT imaging of the chest was performed using the
standard protocol during bolus administration of intravenous
contrast. Multiplanar CT image reconstructions and MIPs were
obtained to evaluate the vascular anatomy.
CONTRAST:  100mL OMNIPAQUE IOHEXOL 350 MG/ML SOLN

[Series 5: thins · axial · 0.73mm/px · z∈[-359,-14]mm · 16 of 379 slices shown]
[im 17/379  lung]
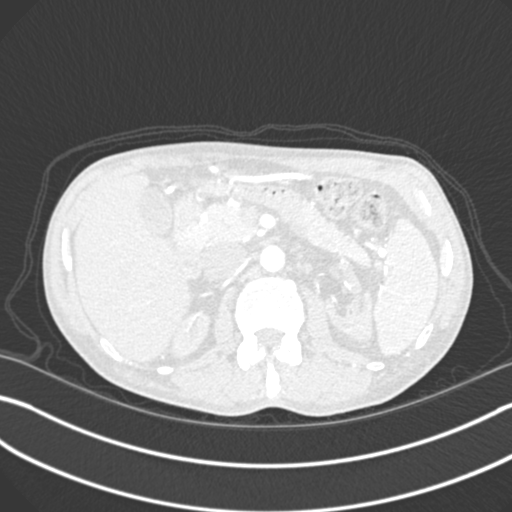
[im 50/379  soft-tissue]
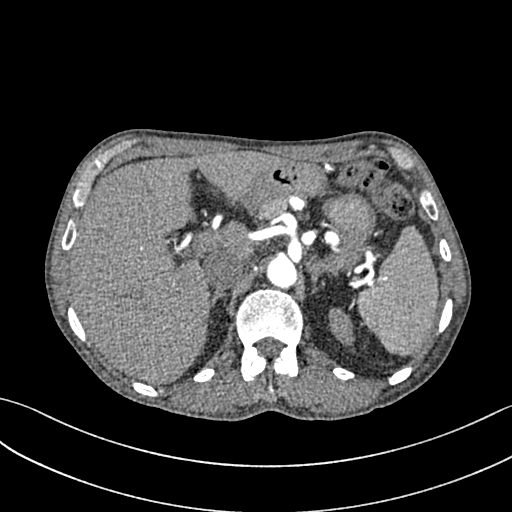
[im 66/379  lung]
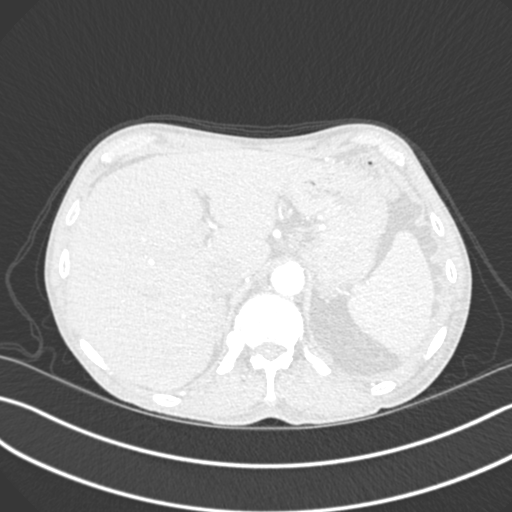
[im 83/379  soft-tissue]
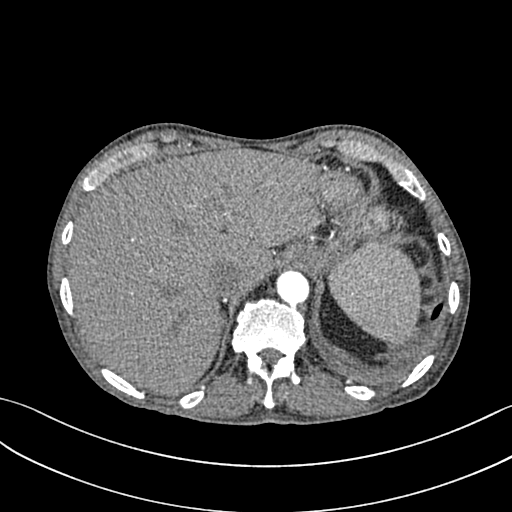
[im 116/379  lung]
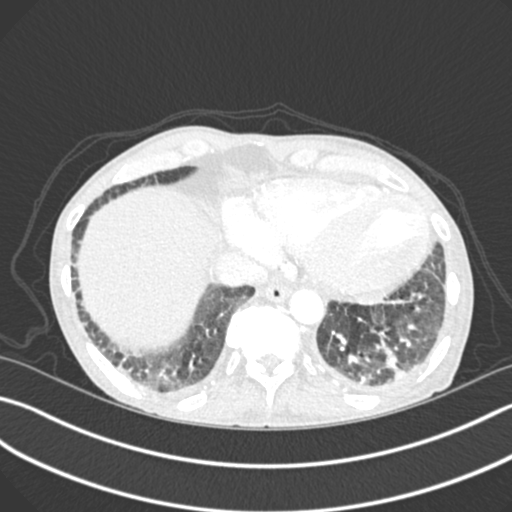
[im 132/379  soft-tissue]
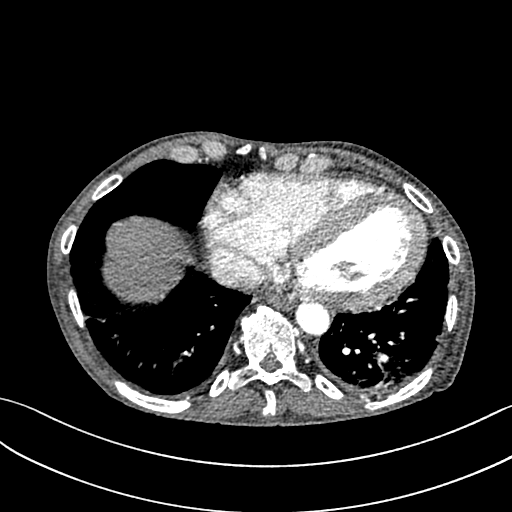
[im 148/379  lung]
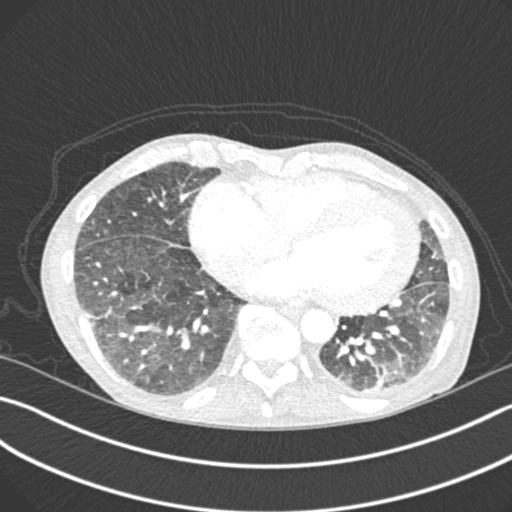
[im 181/379  soft-tissue]
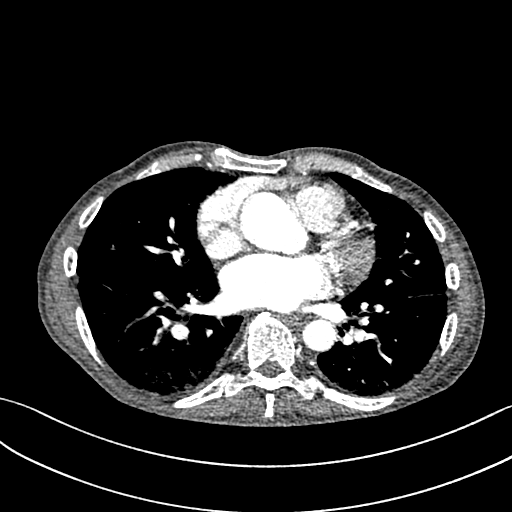
[im 198/379  lung]
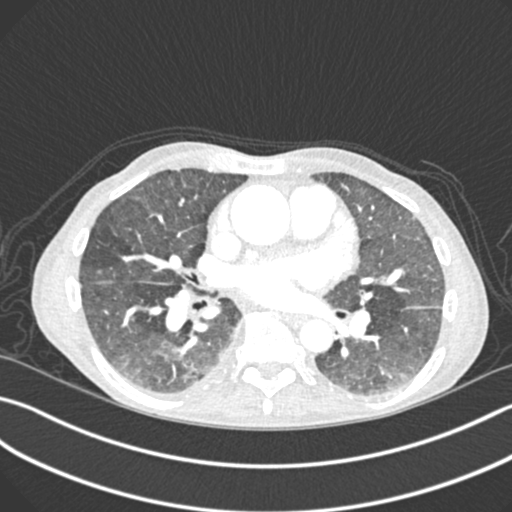
[im 231/379  soft-tissue]
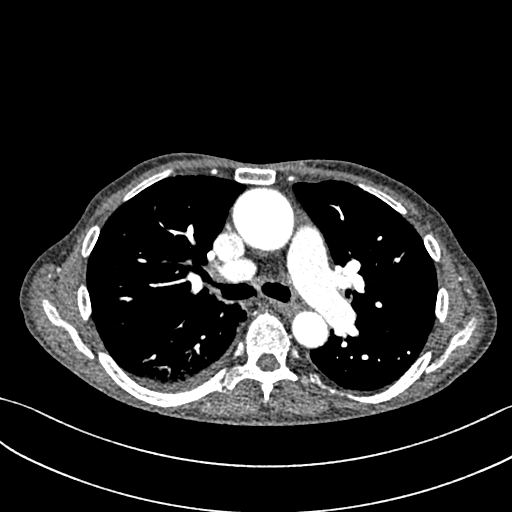
[im 247/379  lung]
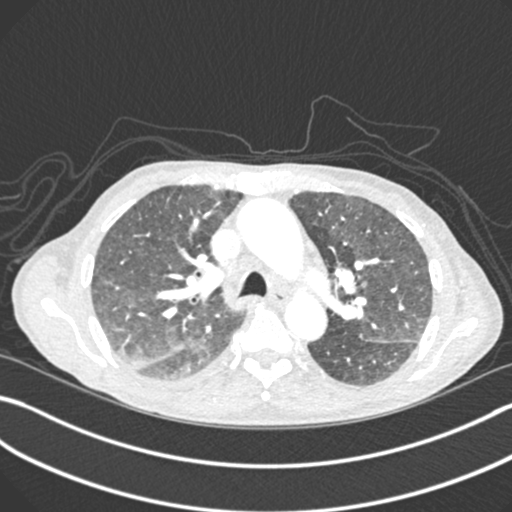
[im 263/379  soft-tissue]
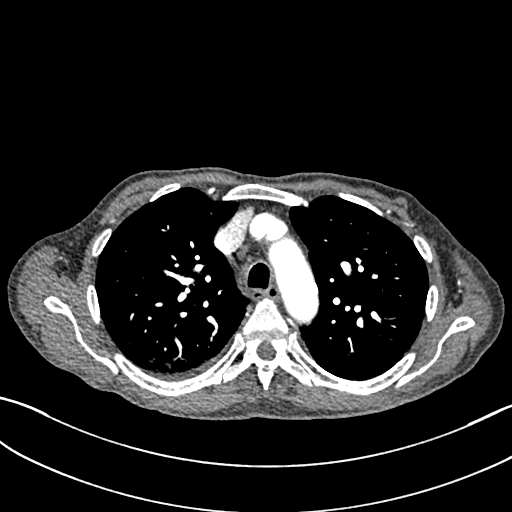
[im 296/379  lung]
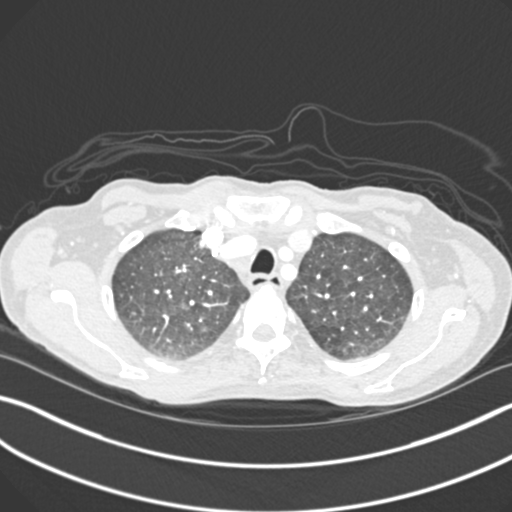
[im 313/379  soft-tissue]
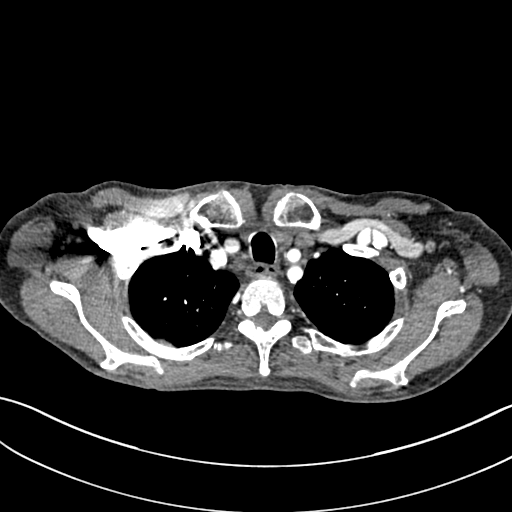
[im 329/379  lung]
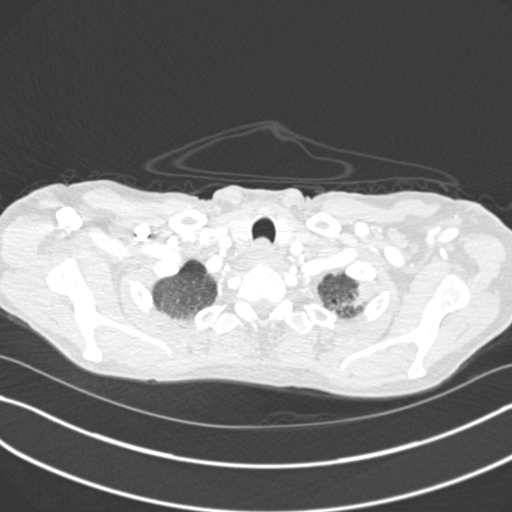
[im 362/379  soft-tissue]
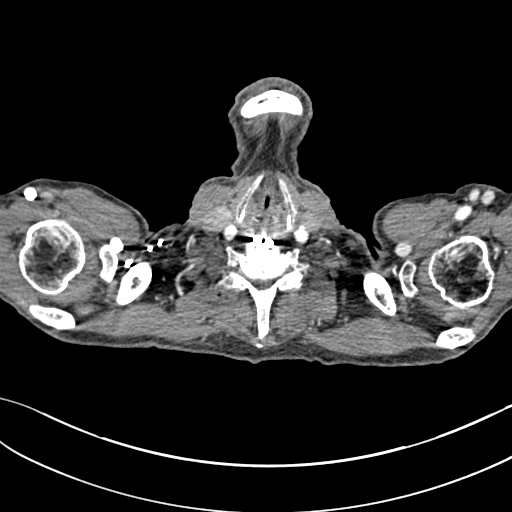

[Series 7: coronal mpr · coronal · 0.75mm/px · 2 of 77 slices shown]
[im 26/77  soft-tissue]
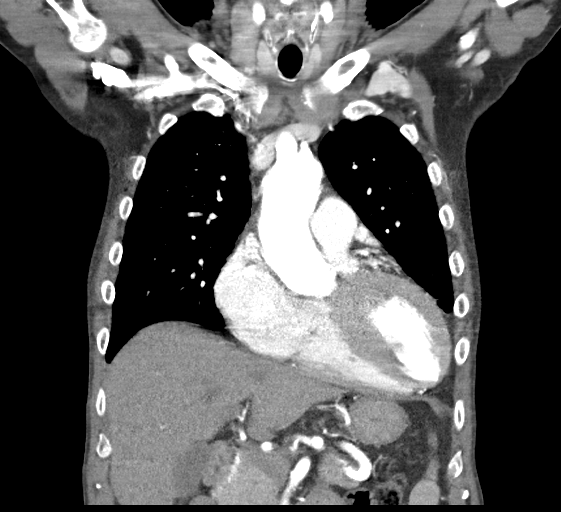
[im 51/77  soft-tissue]
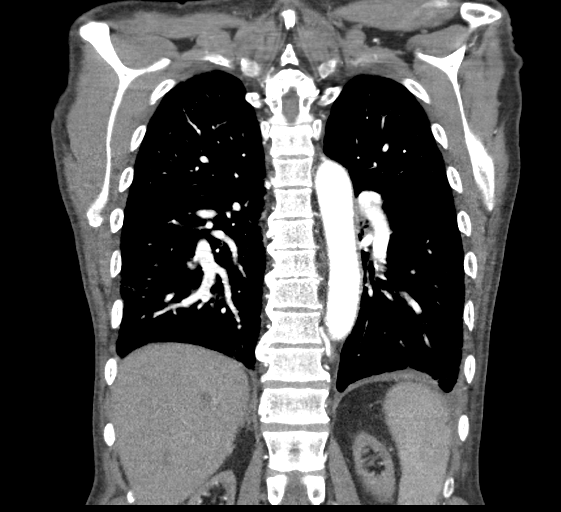

[18 of 46 positions shown; findings below may reference images not displayed]

FINDINGS: Cardiovascular: Fusiform aneurysmal dilation of the tubular portion
of the ascending thoracic aorta. The maximal diameter is 4.5 cm.
While this does measure larger than reported on the prior CT scan,
measurement was difficult on the prior scan secondary to cardiac
motion. By my measurement, the aorta measured at least 4.3 cm on the
prior. Conventional 3 vessel arch anatomy. Mild scattered
atherosclerotic calcifications. Calcifications are present along the
coronary arteries. Stable cardiomegaly. No significant pericardial
effusion. Unremarkable main pulmonary artery.

Mediastinum/Nodes: Unremarkable CT appearance of the thyroid gland.
No suspicious mediastinal or hilar adenopathy. No soft tissue
mediastinal mass. The thoracic esophagus is unremarkable.

Lungs/Pleura: Diffuse mild bronchial wall thickening. Mosaic
attenuation with areas of ground-glass attenuation airspace opacity
and areas of hyperinflation. Mild atelectasis present in the
bilateral lower lobes. There may be trace interlobular septal
thickening consistent with mild interstitial edema.

Upper Abdomen: No acute abnormality within the upper abdomen. The
native kidneys are atrophied.

Musculoskeletal: No acute fracture or aggressive appearing lytic or
blastic osseous lesion.

Review of the MIP images confirms the above findings.
IMPRESSION: 1. Fusiform aneurysmal dilation of the tubular portion of the
ascending thoracic aorta with a maximal transverse diameter of
cm. Previous measurements from 02/08/2020 were limited by
significant cardiac motion artifact. Recommend semi-annual imaging
followup by CTA or MRA and referral to cardiothoracic surgery if not
already obtained. This recommendation follows 8030
ACCF/AHA/AATS/ACR/ASA/SCA/HAROLD/ADMIRA HAKIJA/NYUGDIJAS/ABOU ADEM Guidelines for the
Diagnosis and Management of Patients With Thoracic Aortic Disease.
Circulation. 8030; 121: E266-e369. Aortic aneurysm NOS
(2FZJT-41K.Y); Aortic Atherosclerosis (2FZJT-170.0)
2. Coronary artery calcifications.
3. Mosaic attenuation of the lungs with areas of mild ground-glass
attenuation airspace opacity and areas of probable air trapping.
This suggests underlying small airways or small vessels disease.
4. Diffuse mild bronchial wall thickening suggests chronic
bronchitis.
5. Cardiomegaly with trace interstitial pulmonary edema.

## 2021-01-31 ENCOUNTER — Telehealth: Payer: Self-pay

## 2021-01-31 NOTE — Telephone Encounter (Signed)
   Name: Thomas Robinson  DOB: 12-Apr-1961  MRN: [id number]  Primary Cardiologist: [name], DO  Chart reviewed as part of pre-operative protocol coverage. Because of Elie Goody Reaver's past medical history and time since last visit, he will require a follow-up visit in order to better assess preoperative cardiovascular risk.  He was last seen 08/2020 and recommended for 6 month follow up. Since that time he was hospitalized 08/2020 at [facility] with NSTEMI and [facility] 08/27/20 with no coronary disease, normal LVEDP, symptomatic anemia requiring PRCB, acute on chronic diastolic heart failure in setting of dialysis noncompliance. He has had multiple ED visits and hospitalizations since last seen. Most recent admission 01/12/21.  Appointment already scheduled 02/15/21 with Dr. Harriet Masson. Appointment notes updated. Will route to her as FYI for upcoming appt.   Will route to requesting party via Epic fax function and remove from preop pool. Recommendations will be provided at time of upcoming office visit. Please call with questions.   Loel Dubonnet, NP  01/31/2021, 2:32 PM

## 2021-01-31 NOTE — Telephone Encounter (Signed)
   [facility] Pre-operative Risk Assessment    Patient Name: Thomas Robinson  DOB: [birth year]  MRN: [id number]   HEARTCARE STAFF: - Please ensure there is not already an duplicate clearance open for this procedure. - Under Visit Info/Reason for Call, type in Other and utilize the format Clearance MM/DD/YY or Clearance TBD. Do not use dashes or single digits. - If request is for dental extraction, please clarify the # of teeth to be extracted. - If the patient is currently at the dentist's office, call Pre-Op APP to address. If the patient is not currently in the dentist office, please route to the Pre-Op pool  Request for surgical clearance:  1. What type of surgery is being performed? Lumbar 4-sacral 1 laminectomy and fusion w/ TLIF instrumentation, allograft   2. When is this surgery scheduled? TBD   3. What type of clearance is required (medical clearance vs. Pharmacy clearance to hold med vs. Both)? Medical  4. Are there any medications that need to be held prior to surgery and how long?   5. Practice name and name of physician performing surgery? [facility]- Dr. Patrice Paradise   6. What is the office phone number? [phone]   7.   What is the office fax number? [phone]  8.   Anesthesia type (None, local, MAC, general) ? General   Huda Petrey Tressa Busman 01/31/2021, 1:16 PM  _________________________________________________________________   (provider comments below)

## 2021-02-01 ENCOUNTER — Other Ambulatory Visit: Payer: Self-pay

## 2021-02-01 ENCOUNTER — Encounter (HOSPITAL_COMMUNITY): Payer: Self-pay

## 2021-02-01 ENCOUNTER — Inpatient Hospital Stay (HOSPITAL_COMMUNITY)
Admission: EM | Admit: 2021-02-01 | Discharge: 2021-02-04 | DRG: 252 | Disposition: A | Discharge status: Home / Self Care | Payer: Medicare Other | Source: Other Acute Inpatient Hospital | Attending: Internal Medicine | Admitting: Internal Medicine

## 2021-02-01 DIAGNOSIS — I132 Hypertensive heart and chronic kidney disease with heart failure and with stage 5 chronic kidney disease, or end stage renal disease: Secondary | ICD-10-CM | POA: Diagnosis present

## 2021-02-01 DIAGNOSIS — I5032 Chronic diastolic (congestive) heart failure: Secondary | ICD-10-CM | POA: Diagnosis present

## 2021-02-01 DIAGNOSIS — Z419 Encounter for procedure for purposes other than remedying health state, unspecified: Secondary | ICD-10-CM

## 2021-02-01 DIAGNOSIS — T8189XA Other complications of procedures, not elsewhere classified, initial encounter: Secondary | ICD-10-CM

## 2021-02-01 DIAGNOSIS — L0390 Cellulitis, unspecified: Secondary | ICD-10-CM | POA: Insufficient documentation

## 2021-02-01 DIAGNOSIS — L03116 Cellulitis of left lower limb: Secondary | ICD-10-CM | POA: Insufficient documentation

## 2021-02-01 DIAGNOSIS — Z9981 Dependence on supplemental oxygen: Secondary | ICD-10-CM

## 2021-02-01 DIAGNOSIS — E8889 Other specified metabolic disorders: Secondary | ICD-10-CM | POA: Diagnosis present

## 2021-02-01 DIAGNOSIS — Z87892 Personal history of anaphylaxis: Secondary | ICD-10-CM

## 2021-02-01 DIAGNOSIS — Y832 Surgical operation with anastomosis, bypass or graft as the cause of abnormal reaction of the patient, or of later complication, without mention of misadventure at the time of the procedure: Secondary | ICD-10-CM | POA: Diagnosis present

## 2021-02-01 DIAGNOSIS — L03114 Cellulitis of left upper limb: Secondary | ICD-10-CM | POA: Diagnosis present

## 2021-02-01 DIAGNOSIS — Z79899 Other long term (current) drug therapy: Secondary | ICD-10-CM

## 2021-02-01 DIAGNOSIS — T82898A Other specified complication of vascular prosthetic devices, implants and grafts, initial encounter: Principal | ICD-10-CM | POA: Diagnosis present

## 2021-02-01 DIAGNOSIS — D62 Acute posthemorrhagic anemia: Secondary | ICD-10-CM | POA: Diagnosis present

## 2021-02-01 DIAGNOSIS — W19XXXA Unspecified fall, initial encounter: Secondary | ICD-10-CM | POA: Diagnosis present

## 2021-02-01 DIAGNOSIS — I87309 Chronic venous hypertension (idiopathic) without complications of unspecified lower extremity: Secondary | ICD-10-CM

## 2021-02-01 DIAGNOSIS — Z681 Body mass index (BMI) 19 or less, adult: Secondary | ICD-10-CM

## 2021-02-01 DIAGNOSIS — N186 End stage renal disease: Secondary | ICD-10-CM | POA: Diagnosis present

## 2021-02-01 DIAGNOSIS — Z8673 Personal history of transient ischemic attack (TIA), and cerebral infarction without residual deficits: Secondary | ICD-10-CM

## 2021-02-01 DIAGNOSIS — E440 Moderate protein-calorie malnutrition: Secondary | ICD-10-CM | POA: Diagnosis present

## 2021-02-01 DIAGNOSIS — Z981 Arthrodesis status: Secondary | ICD-10-CM

## 2021-02-01 DIAGNOSIS — Z992 Dependence on renal dialysis: Secondary | ICD-10-CM

## 2021-02-01 DIAGNOSIS — T82848A Pain from vascular prosthetic devices, implants and grafts, initial encounter: Secondary | ICD-10-CM

## 2021-02-01 DIAGNOSIS — I96 Gangrene, not elsewhere classified: Secondary | ICD-10-CM | POA: Diagnosis present

## 2021-02-01 DIAGNOSIS — S5012XA Contusion of left forearm, initial encounter: Secondary | ICD-10-CM | POA: Diagnosis present

## 2021-02-01 DIAGNOSIS — D631 Anemia in chronic kidney disease: Secondary | ICD-10-CM | POA: Diagnosis present

## 2021-02-01 DIAGNOSIS — I871 Compression of vein: Secondary | ICD-10-CM | POA: Diagnosis present

## 2021-02-01 DIAGNOSIS — N2581 Secondary hyperparathyroidism of renal origin: Secondary | ICD-10-CM | POA: Diagnosis present

## 2021-02-01 DIAGNOSIS — J9621 Acute and chronic respiratory failure with hypoxia: Secondary | ICD-10-CM | POA: Diagnosis present

## 2021-02-01 DIAGNOSIS — Z20822 Contact with and (suspected) exposure to covid-19: Secondary | ICD-10-CM | POA: Diagnosis present

## 2021-02-01 DIAGNOSIS — J45909 Unspecified asthma, uncomplicated: Secondary | ICD-10-CM | POA: Diagnosis present

## 2021-02-01 LAB — BASIC METABOLIC PANEL
Anion gap: 16 — ABNORMAL HIGH (ref 5–15)
BUN: 37 mg/dL — ABNORMAL HIGH (ref 8–23)
CO2: 28 mmol/L (ref 22–32)
Calcium: 9.8 mg/dL (ref 8.9–10.3)
Chloride: 93 mmol/L — ABNORMAL LOW (ref 98–111)
Creatinine, Ser: 7.45 mg/dL — ABNORMAL HIGH (ref 0.61–1.24)
GFR, Estimated: 8 mL/min — ABNORMAL LOW (ref >60)
Glucose, Bld: 81 mg/dL (ref 70–99)
Potassium: 4.9 mmol/L (ref 3.5–5.1)
Sodium: 137 mmol/L (ref 135–145)

## 2021-02-01 LAB — CBC WITH DIFFERENTIAL/PLATELET
Abs Immature Granulocytes: 0.01 10*3/uL (ref 0.00–0.07)
Basophils Absolute: 0.1 10*3/uL (ref 0.0–0.1)
Basophils Relative: 1 %
Eosinophils Absolute: 0.2 10*3/uL (ref 0.0–0.5)
Eosinophils Relative: 3 %
HCT: 26 % — ABNORMAL LOW (ref 39.0–52.0)
Hemoglobin: 8.2 g/dL — ABNORMAL LOW (ref 13.0–17.0)
Immature Granulocytes: 0 %
Lymphocytes Relative: 9 %
Lymphs Abs: 0.4 10*3/uL — ABNORMAL LOW (ref 0.7–4.0)
MCH: 33.1 pg (ref 26.0–34.0)
MCHC: 31.5 g/dL (ref 30.0–36.0)
MCV: 104.8 fL — ABNORMAL HIGH (ref 80.0–100.0)
Monocytes Absolute: 0.3 10*3/uL (ref 0.1–1.0)
Monocytes Relative: 7 %
Neutro Abs: 3.7 10*3/uL (ref 1.7–7.7)
Neutrophils Relative %: 80 %
Platelets: 192 10*3/uL (ref 150–400)
RBC: 2.48 MIL/uL — ABNORMAL LOW (ref 4.22–5.81)
RDW: 14.8 % (ref 11.5–15.5)
WBC: 4.7 10*3/uL (ref 4.0–10.5)
nRBC: 0 % (ref 0.0–0.2)

## 2021-02-01 LAB — RESP PANEL BY RT-PCR (FLU A&B, COVID) ARPGX2
Influenza A by PCR: NEGATIVE
Influenza B by PCR: NEGATIVE
SARS Coronavirus 2 by RT PCR: NEGATIVE

## 2021-02-01 LAB — CBG MONITORING, ED: Glucose-Capillary: 95 mg/dL (ref 70–99)

## 2021-02-01 LAB — LACTIC ACID, PLASMA: Lactic Acid, Venous: 1 mmol/L (ref 0.5–1.9)

## 2021-02-01 MED ORDER — METOPROLOL TARTRATE 50 MG PO TABS
50.0000 mg | ORAL_TABLET | Freq: Two times a day (BID) | ORAL | Status: DC
  Administered 2021-02-01 – 2021-02-03 (×4): 50 mg via ORAL
  Filled 2021-02-01 (×5): qty 1

## 2021-02-01 MED ORDER — HYDRALAZINE HCL 50 MG PO TABS
50.0000 mg | ORAL_TABLET | Freq: Two times a day (BID) | ORAL | Status: DC
  Administered 2021-02-01 – 2021-02-04 (×4): 50 mg via ORAL
  Filled 2021-02-01 (×4): qty 1

## 2021-02-01 MED ORDER — VANCOMYCIN HCL 1250 MG/250ML IV SOLN
1250.0000 mg | Freq: Once | INTRAVENOUS | Status: DC
  Administered 2021-02-01: 1250 mg via INTRAVENOUS
  Filled 2021-02-01: qty 250

## 2021-02-01 MED ORDER — PREGABALIN 25 MG PO CAPS
25.0000 mg | ORAL_CAPSULE | Freq: Every day | ORAL | Status: DC | PRN
  Administered 2021-02-01: 25 mg via ORAL
  Filled 2021-02-01: qty 1

## 2021-02-01 MED ORDER — FLUTICASONE FUROATE-VILANTEROL 100-25 MCG/INH IN AEPB: 1.0000 | INHALATION_SPRAY | Freq: Two times a day (BID) | RESPIRATORY_TRACT | Status: DC | PRN

## 2021-02-01 MED ORDER — ONDANSETRON HCL 4 MG/2ML IJ SOLN: 4.0000 mg | Freq: Four times a day (QID) | INTRAMUSCULAR | Status: DC | PRN

## 2021-02-01 MED ORDER — DOXERCALCIFEROL 0.5 MCG PO CAPS: 0.5000 ug | ORAL_CAPSULE | ORAL | Status: DC

## 2021-02-01 MED ORDER — HYDROMORPHONE HCL 1 MG/ML IJ SOLN
0.5000 mg | Freq: Once | INTRAMUSCULAR | Status: AC
  Administered 2021-02-01: 0.5 mg via INTRAVENOUS
  Filled 2021-02-01: qty 1

## 2021-02-01 MED ORDER — FUROSEMIDE 80 MG PO TABS
80.0000 mg | ORAL_TABLET | Freq: Once | ORAL | Status: AC
  Administered 2021-02-02: 80 mg via ORAL
  Filled 2021-02-01: qty 1

## 2021-02-01 MED ORDER — VITAMIN D 25 MCG (1000 UNIT) PO TABS
1000.0000 [IU] | ORAL_TABLET | ORAL | Status: DC
  Administered 2021-02-02: 1000 [IU] via ORAL
  Filled 2021-02-01: qty 1

## 2021-02-01 MED ORDER — ACETAMINOPHEN 650 MG RE SUPP: 650.0000 mg | Freq: Four times a day (QID) | RECTAL | Status: DC | PRN

## 2021-02-01 MED ORDER — SEVELAMER CARBONATE 800 MG PO TABS
800.0000 mg | ORAL_TABLET | Freq: Every day | ORAL | Status: DC
  Administered 2021-02-02: 800 mg via ORAL
  Filled 2021-02-01: qty 1

## 2021-02-01 MED ORDER — RENA-VITE PO TABS
1.0000 | ORAL_TABLET | Freq: Every day | ORAL | Status: DC
  Administered 2021-02-02 – 2021-02-03 (×2): 1 via ORAL
  Filled 2021-02-01 (×3): qty 1

## 2021-02-01 MED ORDER — HEPARIN SODIUM (PORCINE) 5000 UNIT/ML IJ SOLN
5000.0000 [IU] | Freq: Three times a day (TID) | INTRAMUSCULAR | Status: DC
  Administered 2021-02-01 – 2021-02-04 (×8): 5000 [IU] via SUBCUTANEOUS
  Filled 2021-02-01 (×6): qty 1

## 2021-02-01 MED ORDER — CEFAZOLIN SODIUM-DEXTROSE 1-4 GM/50ML-% IV SOLN
1.0000 g | INTRAVENOUS | Status: AC
  Filled 2021-02-01 (×2): qty 50

## 2021-02-01 MED ORDER — AMLODIPINE BESYLATE 10 MG PO TABS
10.0000 mg | ORAL_TABLET | Freq: Every day | ORAL | Status: DC
  Administered 2021-02-02 – 2021-02-03 (×2): 10 mg via ORAL
  Filled 2021-02-01 (×3): qty 1

## 2021-02-01 MED ORDER — SODIUM CHLORIDE 0.9 % IV SOLN: INTRAVENOUS | Status: DC

## 2021-02-01 MED ORDER — HYDROMORPHONE HCL 1 MG/ML IJ SOLN
0.5000 mg | INTRAMUSCULAR | Status: DC
  Administered 2021-02-01: 0.5 mg via INTRAVENOUS
  Administered 2021-02-02 (×3): 1 mg via INTRAVENOUS
  Administered 2021-02-02: 0.5 mg via INTRAVENOUS
  Administered 2021-02-02 – 2021-02-04 (×7): 1 mg via INTRAVENOUS
  Filled 2021-02-01 (×12): qty 1

## 2021-02-01 MED ORDER — ALBUTEROL SULFATE HFA 108 (90 BASE) MCG/ACT IN AERS: 2.0000 | INHALATION_SPRAY | RESPIRATORY_TRACT | Status: DC | PRN

## 2021-02-01 MED ORDER — ONDANSETRON HCL 4 MG PO TABS: 4.0000 mg | ORAL_TABLET | Freq: Four times a day (QID) | ORAL | Status: DC | PRN

## 2021-02-01 MED ORDER — ACETAMINOPHEN 325 MG PO TABS: 650.0000 mg | ORAL_TABLET | Freq: Four times a day (QID) | ORAL | Status: DC | PRN

## 2021-02-01 MED ORDER — ALBUTEROL SULFATE (2.5 MG/3ML) 0.083% IN NEBU: 2.5000 mg | INHALATION_SOLUTION | RESPIRATORY_TRACT | Status: DC | PRN

## 2021-02-01 MED ORDER — FLUTICASONE FUROATE-VILANTEROL 100-25 MCG/INH IN AEPB
1.0000 | INHALATION_SPRAY | Freq: Every day | RESPIRATORY_TRACT | Status: DC
  Administered 2021-02-04: 1 via RESPIRATORY_TRACT
  Filled 2021-02-01: qty 28

## 2021-02-01 NOTE — ED Triage Notes (Signed)
Pt arrived via [facility]. Pt attempted to go to dialysis today, but they refused to dialyse him due to entire left arm being swollen and painful. Pt states that started today. Pt has a wound on the left lower arm for 2 wks. Per EMS, the wound looks infected. Pt is A&Ox4.

## 2021-02-01 NOTE — ED Provider Notes (Addendum)
[facility] EMERGENCY DEPARTMENT Provider Note   CSN: [id number] Arrival date & time: 02/01/21  1234     History Chief Complaint  Patient presents with  . wound infection    Thomas Robinson is a 60 y.o. male.  60 year old male presents from dialysis center due to increased pain to his dialysis graft at his left upper extremity.  Has had increased redness as well as area of infection noted to his left forearm.  No drainage from the wound.  It is unclear if he is on antibiotics.  Has chronic edema of the right hand which is improved actually.  Denies any distal numbness or tingling to his left hand.  No fever or chills.  According to the staff at dialysis today, patient has severe pain when he attempted to cannulized his graft.  Graft function but the patient could not tolerate being on a machine.  He only went for few minutes.  Patient was last dialyzed 2 days ago and also had that session cut short as well.  Patient states here for further management        Past Medical History:  Diagnosis Date  . Anaphylactic shock, unspecified, sequela 06/10/2019  . Arthritis   . Asthma, chronic, unspecified asthma severity, with acute exacerbation 10/23/2017  . CAP (community acquired pneumonia) 10/23/2017  . Carpal tunnel syndrome of right wrist 10/05/2018  . Cataract    right - removed by surgery  . Cerebral infarction due to thrombosis of cerebral artery ([place])   . Cervical disc herniation 01/04/2020  . Chronic diastolic heart failure ([place]) 04/13/2018  . Chronic low back pain 11/24/2019  . Constipation   . Cough    chronic cough  . Encounter for immunization 07/08/2017  . ESRD on hemodialysis ([place]) 02/16/2018   Dialysis  T Thus Sat  - [facility]   . Fall 06/04/2019  . GERD (gastroesophageal reflux disease) 06/04/2019  . GI bleeding 05/24/2017  . Gram-negative sepsis, unspecified ([place]) 09/05/2017  . History of fusion of cervical spine 03/26/2020  . Hyperlipidemia   .  Hypertension   . Hypokalemia 06/04/2017  . Iron deficiency anemia, unspecified 09/09/2017  . Left shoulder pain 11/11/2019  . Lumbar radiculopathy 11/24/2019  . Lung contusion 06/04/2019  . LVH (left ventricular hypertrophy) due to hypertensive disease, with heart failure ([place]) 06/18/2020  . Macrocytic anemia 05/24/2017  . Moderate protein-calorie malnutrition ([place]) 06/06/2017  . Myofascial pain syndrome 06/12/2020  . Neuritis of right ulnar nerve 09/13/2018  . Non-compliance with renal dialysis ([place]) 02/08/2020  . Oxygen deficiency 12/30/2019   O2 sats on RA 87% at PAT appt   . Pain in joint of right elbow 09/13/2018  . Pulmonary hypertension ([place])   . Renovascular hypertension 06/22/2020  . Restless leg syndrome   . Rib fractures 06/2019   Right  . S/P cardiac cath 08/27/2020   normal coronary arteries.  . Secondary hyperparathyroidism of renal origin ([place]) 06/04/2017  . Thoracic ascending aortic aneurysm ([facility])    4.5 cm 06/04/19 CT  . Ulnar neuropathy 10/05/2018  . Volume overload 10/23/2017    Patient Active Problem List   Diagnosis Date Noted  . Acute on chronic respiratory failure ([place]) 01/13/2021  . Acute on chronic respiratory failure with hypoxia ([place]) 01/12/2021  . Concussion with no loss of consciousness 01/12/2021  . Laceration without foreign body of left forearm, initial encounter 01/12/2021  . Age-related physical debility 09/20/2020  . Allergy, unspecified, initial encounter 09/20/2020  . Nausea  09/20/2020  . Pain, unspecified 09/20/2020  . Pruritus, unspecified 09/20/2020  . Acute encephalopathy 08/26/2020  . Peripheral neuropathy 08/26/2020  . Elevated troponin 08/25/2020  . Hyperkalemia 08/05/2020  . Arthritis   . Asthma   . Cataract   . Cerebral infarction due to thrombosis of cerebral artery ([place])   . History of CVA (cerebrovascular accident)   . Chronic kidney disease   . Dyspnea   . Hyperlipidemia   . Hypertension   . Kidney failure   . Restless leg  syndrome   . Degenerative lumbar spinal stenosis 06/26/2020  . Diastolic dysfunction 09/03/7251  . Renovascular hypertension 06/22/2020  . Demand ischemia ([place]) 06/18/2020  . LVH (left ventricular hypertrophy) due to hypertensive disease, with heart failure ([place]) 06/18/2020  . Myofascial pain syndrome 06/12/2020  . Encounter for removal of sutures 03/29/2020  . History of fusion of cervical spine 03/26/2020  . Non-compliance with renal dialysis ([place]) 02/08/2020  . Acute pulmonary edema ([place]) 01/23/2020  . Cervical disc herniation 01/04/2020  . Tachycardia 01/04/2020  . SOB (shortness of breath)   . Oxygen deficiency 12/30/2019  . Chronic low back pain 11/24/2019  . Degeneration of lumbar intervertebral disc 11/24/2019  . Lumbar radiculopathy 11/24/2019  . Left shoulder pain 11/11/2019  . Anaphylactic shock, unspecified, sequela 06/10/2019  . Rib fractures 06/05/2019  . Fall at home, initial encounter 06/04/2019  . Right rib fracture 06/04/2019  . Lung contusion 06/04/2019  . Acute respiratory failure with hypoxia ([place]) 06/04/2019  . Anemia in ESRD (end-stage renal disease) ([place]) 06/04/2019  . GERD (gastroesophageal reflux disease) 06/04/2019  . Chronic diastolic (congestive) heart failure ([place]) 06/04/2019  . Fall 06/04/2019  . Thoracic ascending aortic aneurysm ([place]) 06/04/2019  . Acute exacerbation of congestive heart failure ([place]) 05/16/2019  . Carpal tunnel syndrome of right wrist 10/05/2018  . Ulnar neuropathy 10/05/2018  . Neuritis of right ulnar nerve 09/13/2018  . Pain in joint of right elbow 09/13/2018  . Chest pain 04/13/2018  . Chronic diastolic heart failure ([place]) 04/13/2018  . Acute on chronic diastolic CHF (congestive heart failure) ([place]) 04/13/2018  . Chest discomfort   . Atypical chest pain 02/16/2018  . ESRD on dialysis ([place]) 02/16/2018  . ESRD (end stage renal disease) on dialysis ([place]) 02/16/2018  . Volume overload 10/23/2017  . CAP (community acquired  pneumonia) 10/23/2017  . Asthma, chronic, unspecified asthma severity, with acute exacerbation 10/23/2017  . Respiratory failure, acute ([place]) 10/23/2017  . Iron deficiency anemia, unspecified 09/09/2017  . Gram-negative sepsis, unspecified ([place]) 09/05/2017  . Cough 08/23/2017  . Encounter for immunization 07/08/2017  . Moderate protein-calorie malnutrition ([place]) 06/06/2017  . Coagulation defect, unspecified ([place]) 06/04/2017  . Hypokalemia 06/04/2017  . Other hyperlipidemia 06/04/2017  . Secondary hyperparathyroidism of renal origin ([place]) 06/04/2017  . Constipation   . Macrocytic anemia 05/24/2017  . Uremia 05/24/2017  . Hypertensive urgency 05/24/2017  . Acute kidney injury superimposed on CKD ([place]) 05/24/2017  . CKD (chronic kidney disease), stage V ([place]) 05/24/2017  . GI bleeding 05/24/2017  . Symptomatic anemia 05/24/2017  . Stroke ([place]) 2018  . AKI (acute kidney injury) ([place]) 04/10/2015  . History of completed stroke   . Essential hypertension 04/09/2015    Past Surgical History:  Procedure Laterality Date  . A/V FISTULAGRAM N/A 01/01/2021   Procedure: A/V FISTULAGRAM;  Surgeon: Serafina Mitchell, MD;  Location: [place] [facility];  Service: Cardiovascular;  Laterality: N/A;  . A/V FISTULAGRAM N/A 01/15/2021   Procedure: A/V FISTULAGRAM;  Surgeon: Serafina Mitchell, MD;  Location: [place] [facility];  Service: Cardiovascular;  Laterality: N/A;  . ANTERIOR CERVICAL DECOMP/DISCECTOMY FUSION N/A 01/04/2020   Procedure: ANTERIOR CERVICAL DECOMPRESSION/DISCECTOMY FUSION CERVICAL FIVE THROUGH SEVEN;  Surgeon: Melina Schools, MD;  Location: [place];  Service: Orthopedics;  Laterality: N/A;  3 hrs  . AV FISTULA PLACEMENT Left 05/28/2017   Procedure: LEFT ARM ARTERIOVENOUS (AV) FISTULA CREATION;  Surgeon: Conrad Elmore, MD;  Location: [place];  Service: Vascular;  Laterality: Left;  . EYE SURGERY Right 06/02/2019   Cataract removed  . FRACTURE SURGERY    . IR DIALY SHUNT INTRO NEEDLE/INTRACATH  INITIAL W/IMG LEFT Left 09/12/2020  . IR FLUORO GUIDE CV LINE RIGHT  05/25/2017  . IR US GUIDE VASC ACCESS LEFT  09/12/2020  . IR US GUIDE VASC ACCESS RIGHT  05/25/2017  . LIGATION OF ARTERIOVENOUS  FISTULA Left 01/10/2021   Procedure: LIGATION OF LEFT ARM RADIOCEPHALIC FISTULA;  Surgeon: Serafina Mitchell, MD;  Location: [place];  Service: Vascular;  Laterality: Left;  . PERIPHERAL VASCULAR BALLOON ANGIOPLASTY Left 01/15/2021   Procedure: PERIPHERAL VASCULAR BALLOON ANGIOPLASTY;  Surgeon: Serafina Mitchell, MD;  Location: [place] [facility];  Service: Cardiovascular;  Laterality: Left;  AVF  . REVISON OF ARTERIOVENOUS FISTULA Left 07/18/2019   Procedure: REVISION PLICATION OF RADIOCEPHALIC ARTERIOVENOUS FISTULA LEFT ARM;  Surgeon: Angelia Mould, MD;  Location: [place];  Service: Vascular;  Laterality: Left;  . REVISON OF ARTERIOVENOUS FISTULA Left 01/10/2021   Procedure: CONVERSION TO BRACHIOCEPHALIC ARTERIOVENOUS FISTULA;  Surgeon: Serafina Mitchell, MD;  Location: [place];  Service: Vascular;  Laterality: Left;  . RINOPLASTY         Family History  Problem Relation Age of Onset  . Cancer Mother     Social History   Tobacco Use  . Smoking status: Never Smoker  . Smokeless tobacco: Never Used  Vaping Use  . Vaping Use: Never used  Substance Use Topics  . Alcohol use: Not Currently    Alcohol/week: 0.0 standard drinks    Comment: has a drink once in a while- 12/30/19  . Drug use: No    Home Medications Prior to Admission medications   Medication Sig Start Date End Date Taking? Authorizing Provider  acetaminophen (TYLENOL) 500 MG tablet Take 500 mg by mouth every 6 (six) hours as needed for moderate pain or headache.    [provider]  albuterol (VENTOLIN HFA) 108 (90 Base) MCG/ACT inhaler Inhale 2 puffs into the lungs every 6 (six) hours as needed for wheezing or shortness of breath.     [provider]  amLODipine (NORVASC) 10 MG tablet Take 10 mg by mouth daily.      [provider]  AURYXIA 1 GM 210 MG(Fe) tablet Take 420 mg by mouth 3 (three) times daily with meals.  03/16/20   [provider]  BREO ELLIPTA 100-25 MCG/INH AEPB Inhale 1 puff into the lungs daily. 06/26/20   [provider]  cholecalciferol (VITAMIN D3) 25 MCG (1000 UNIT) tablet Take 1,000 Units by mouth daily.    [provider]  cinacalcet (SENSIPAR) 60 MG tablet Take 60 mg by mouth daily.    [provider]  DIPHENHYDRAMINE HCL PO Take 25 mg by mouth as needed (allergies). 09/24/20 09/23/21  [provider]  doxercalciferol (HECTOROL) 0.5 MCG capsule Doxercalciferol (Hectorol) 11/26/20 11/25/21  [provider]  furosemide (LASIX) 40 MG tablet Take 1 tablet (40 mg total) by mouth  See admin instructions. On Sunday, Monday, Wednesday, Thursday and Saturday Patient taking differently: Take 40 mg by mouth daily as needed for fluid. 08/14/20   Tobb, Kardie, DO  gabapentin (NEURONTIN) 300 MG capsule Take 300 mg by mouth 2 (two) times daily as needed (pain).     [provider]  hydrALAZINE (APRESOLINE) 25 MG tablet Take 50 mg by mouth in the morning and at bedtime.    [provider]  metoprolol tartrate (LOPRESSOR) 100 MG tablet Take 0.5 tablets (50 mg total) by mouth 2 (two) times daily. 01/15/21   Alma Friendly, MD  multivitamin (RENA-VIT) TABS tablet Take 1 tablet by mouth daily. 01/12/18   [provider]  pregabalin (LYRICA) 25 MG capsule Take 25 mg by mouth daily as needed (pain). 11/29/20   [provider]  sevelamer carbonate (RENVELA) 800 MG tablet Take 800 mg by mouth daily. 11/07/19   [provider]  Specialty Vitamins Products (HEALTHY HEART COMPLEX PO) Take 1 tablet by mouth daily.    [provider]  traMADol (ULTRAM) 50 MG tablet Take 1 tablet (50 mg total) by mouth every 6 (six) hours as needed for moderate pain. 01/25/21   Ulyses Amor, PA-C    Allergies     Patient has no known allergies.  Review of Systems   Review of Systems  All other systems reviewed and are negative.   Physical Exam Updated Vital Signs BP (!) 170/89   Pulse 74   Temp 98 F (36.7 C) (Oral)   Resp (!) 22   Ht 1.727 m (5\' 8" )   Wt 58.2 kg   SpO2 93%   BMI 19.51 kg/m   Physical Exam Vitals and nursing note reviewed.  Constitutional:      General: He is not in acute distress.    Appearance: Normal appearance. He is well-developed. He is not toxic-appearing.  HENT:     Head: Normocephalic and atraumatic.  Eyes:     General: Lids are normal.     Conjunctiva/sclera: Conjunctivae normal.     Pupils: Pupils are equal, round, and reactive to light.  Neck:     Thyroid: No thyroid mass.     Trachea: No tracheal deviation.  Cardiovascular:     Rate and Rhythm: Normal rate and regular rhythm.     Heart sounds: Normal heart sounds. No murmur heard. No gallop.   Pulmonary:     Effort: Pulmonary effort is normal. No respiratory distress.     Breath sounds: Normal breath sounds. No stridor. No decreased breath sounds, wheezing, rhonchi or rales.  Abdominal:     General: Bowel sounds are normal. There is no distension.     Palpations: Abdomen is soft.     Tenderness: There is no abdominal tenderness. There is no rebound.  Musculoskeletal:        General: No tenderness. Normal range of motion.     Cervical back: Normal range of motion and neck supple.     Comments: Left upper extremity dialysis graft with good thrill.  Large area of eschar noted to left forearm with some surrounding erythema.  No crepitus noted.  No active drainage.  Neurovascular intact at left hand  Skin:    General: Skin is warm and dry.     Findings: No abrasion or rash.  Neurological:     Mental Status: He is alert and oriented to person, place, and time.     GCS: GCS eye subscore is 4. GCS verbal  subscore is 5. GCS motor subscore is 6.     Cranial Nerves: No cranial nerve deficit.      Sensory: No sensory deficit.  Psychiatric:        Speech: Speech normal.        Behavior: Behavior normal.     ED Results / Procedures / Treatments   Labs (all labs ordered are listed, but only abnormal results are displayed) Labs Reviewed  CULTURE, BLOOD (ROUTINE X 2)  CULTURE, BLOOD (ROUTINE X 2)  CBC WITH DIFFERENTIAL/PLATELET  BASIC METABOLIC PANEL  LACTIC ACID, PLASMA    EKG EKG Interpretation  Date/Time:  Friday February 01 2021 12:35:26 EDT Ventricular Rate:  72 PR Interval:  191 QRS Duration: 91 QT Interval:  442 QTC Calculation: 484 R Axis:   65 Text Interpretation: Sinus rhythm Atrial premature complexes Probable left ventricular hypertrophy Nonspecific T abnrm, anterolateral leads Borderline prolonged QT interval Confirmed by Lacretia Leigh ([id number]) on 02/01/2021 1:02:42 PM   Radiology No results found.  Procedures Procedures   Medications Ordered in ED Medications  0.9 %  sodium chloride infusion (has no administration in time range)    ED Course  I have reviewed the triage vital signs and the nursing notes.  Pertinent labs & imaging results that were available during my care of the patient were reviewed by me and considered in my medical decision making (see chart for details).    MDM Rules/Calculators/A&P                          Will start patient on antibiotics for presumed cellulitis.  Potassium is stable at 4.9.  Lactate is normal.  Discussed with Dr. Oneida Alar from vascular surgeon who will come and see the patient in consultation.  Will admit for cellulitis Final Clinical Impression(s) / ED Diagnoses Final diagnoses:  None    Rx / DC Orders ED Discharge Orders    None       Lacretia Leigh, MD 02/01/21 1410    Lacretia Leigh, MD 02/01/21 [phone]

## 2021-02-01 NOTE — H&P (Addendum)
Date: 02/01/2021               Patient Name:  Thomas Robinson MRN: [id number]  DOB: [birth year] Age / Sex: 60 y.o., male   PCP: [name], PA         Medical Service: Internal Medicine Teaching Service         Attending Physician: Dr. Lacretia Leigh, MD    First Contact: Linwood Dibbles, MD Pager: [place] [phone]  Second Contact: Tamsen Snider, MD Pager: Cherly Hensen [phone]       After Hours (After 5p/  First Contact Pager: [phone]  weekends / holidays): Second Contact Pager: [phone]   SUBJECTIVE   Chief Complaint: Left arm edema and wound  History of Present Illness: Thomas Robinson is a 60 year old person living with HTN, chronic diastolic heart failure , and end-stage renal disease on Monday Wednesday Friday hemodialysis , recent brachiocephalic AV fistula placement and ligation of left radiocephalic fistula with angioplasty of his left subclavian vein on May 17  who presents for evaluation of left arm wound and left arm swelling.  Patient reports he had a fall approximately 2 weeks ago which caused a wound on his left arm at the site of his left arm radiocephalic fistula ligation.  Dr. Trula Slade created a brachiocephalic fistula on 6/07.  He most recently was seen by vascular surgery on 5/27 for ongoing edema in his left arm.  Per chart review patient was offered ligation of the upper arm fistula in place of a tunneled dialysis catheter for HD.  Patient declined and preferred no further surgery at this time.  Plan was to treat edema with elastic compression and elevation.  Patient was at dialysis today and unable to tolerate cannulization of graft secondary to pain. His last HD session was 6/1.  Patient still makes urine and has been on hemodialysis for approximately 4 years.  Patient has lumbar back pain which he is undergoing evaluation for surgery.   Review of Systems  Constitutional: Negative for chills and fever.  Eyes: Negative for blurred vision and double vision.  Respiratory: Negative  for cough and shortness of breath.   Cardiovascular: Negative for chest pain and leg swelling.  Gastrointestinal: Negative for abdominal pain, constipation and diarrhea.  Genitourinary: Negative for frequency and urgency.  Musculoskeletal: Positive for back pain and falls (2 weeks ago).  Skin: Negative for itching and rash.  Neurological: Positive for weakness (weakness in left arm). Negative for dizziness, tingling and headaches.  Psychiatric/Behavioral: Negative for hallucinations and substance abuse.     ED Course:  Meds:  Current Meds  Medication Sig  . acetaminophen (TYLENOL) 500 MG tablet Take 500 mg by mouth every 6 (six) hours as needed for moderate pain or headache.  . albuterol (VENTOLIN HFA) 108 (90 Base) MCG/ACT inhaler Inhale 2 puffs into the lungs in the morning and at bedtime.  [name] amLODipine (NORVASC) 10 MG tablet Take 10 mg by mouth daily.   [name] atorvastatin (LIPITOR) 20 MG tablet Take 20 mg by mouth daily.  [name] BREO ELLIPTA 100-25 MCG/INH AEPB Inhale 1 puff into the lungs 2 (two) times daily as needed (sob/wheezing).  . cholecalciferol (VITAMIN D3) 25 MCG (1000 UNIT) tablet Take 1,000 Units by mouth once a week.  [name] DIPHENHYDRAMINE HCL PO Take 25 mg by mouth as needed (allergies).  [name] doxercalciferol (HECTOROL) 0.5 MCG capsule Take 0.5 mcg by mouth 3 (three) times a week.  . furosemide (LASIX) 80 MG tablet Take 80 mg by mouth as  directed. Take Daily PRN On Non-Dialysis Days (Sun, Tues, Thurs, Sat)  . gabapentin (NEURONTIN) 100 MG capsule Take 100 mg by mouth 3 (three) times daily.    Past Medical History:  Diagnosis Date  . Anaphylactic shock, unspecified, sequela 06/10/2019  . Arthritis   . Asthma, chronic, unspecified asthma severity, with acute exacerbation 10/23/2017  . CAP (community acquired pneumonia) 10/23/2017  . Carpal tunnel syndrome of right wrist 10/05/2018  . Cataract    right - removed by surgery  . Cerebral infarction due to thrombosis of cerebral artery  ([place])   . Cervical disc herniation 01/04/2020  . Chronic diastolic heart failure ([place]) 04/13/2018  . Chronic low back pain 11/24/2019  . Constipation   . Cough    chronic cough  . Encounter for immunization 07/08/2017  . ESRD on hemodialysis ([place]) 02/16/2018   Dialysis  T Thus Sat  - [facility]   . Fall 06/04/2019  . GERD (gastroesophageal reflux disease) 06/04/2019  . GI bleeding 05/24/2017  . Gram-negative sepsis, unspecified ([place]) 09/05/2017  . History of fusion of cervical spine 03/26/2020  . Hyperlipidemia   . Hypertension   . Hypokalemia 06/04/2017  . Iron deficiency anemia, unspecified 09/09/2017  . Left shoulder pain 11/11/2019  . Lumbar radiculopathy 11/24/2019  . Lung contusion 06/04/2019  . LVH (left ventricular hypertrophy) due to hypertensive disease, with heart failure ([place]) 06/18/2020  . Macrocytic anemia 05/24/2017  . Moderate protein-calorie malnutrition ([place]) 06/06/2017  . Myofascial pain syndrome 06/12/2020  . Neuritis of right ulnar nerve 09/13/2018  . Non-compliance with renal dialysis ([place]) 02/08/2020  . Oxygen deficiency 12/30/2019   O2 sats on RA 87% at PAT appt   . Pain in joint of right elbow 09/13/2018  . Pulmonary hypertension ([place])   . Renovascular hypertension 06/22/2020  . Restless leg syndrome   . Rib fractures 06/2019   Right  . S/P cardiac cath 08/27/2020   normal coronary arteries.  . Secondary hyperparathyroidism of renal origin ([place]) 06/04/2017  . Thoracic ascending aortic aneurysm ([facility])    4.5 cm 06/04/19 CT  . Ulnar neuropathy 10/05/2018  . Volume overload 10/23/2017    Past Surgical History:  Procedure Laterality Date  . A/V FISTULAGRAM N/A 01/01/2021   Procedure: A/V FISTULAGRAM;  Surgeon: Serafina Mitchell, MD;  Location: [facility];  Service: Cardiovascular;  Laterality: N/A;  . A/V FISTULAGRAM N/A 01/15/2021   Procedure: A/V [id number];  Surgeon: Serafina Mitchell, MD;  Location: [facility];  Service: Cardiovascular;   Laterality: N/A;  . ANTERIOR CERVICAL DECOMP/DISCECTOMY FUSION N/A 01/04/2020   Procedure: ANTERIOR CERVICAL DECOMPRESSION/DISCECTOMY FUSION CERVICAL FIVE THROUGH SEVEN;  Surgeon: Melina Schools, MD;  Location: [facility];  Service: Orthopedics;  Laterality: N/A;  3 hrs  . AV FISTULA PLACEMENT Left 05/28/2017   Procedure: LEFT ARM ARTERIOVENOUS (AV) FISTULA CREATION;  Surgeon: Conrad Ruthven, MD;  Location: [facility];  Service: Vascular;  Laterality: Left;  . EYE SURGERY Right 06/02/2019   Cataract removed  . FRACTURE SURGERY    . IR DIALY SHUNT INTRO NEEDLE/INTRACATH INITIAL W/IMG LEFT Left 09/12/2020  . IR FLUORO GUIDE CV LINE RIGHT  05/25/2017  . IR US GUIDE VASC ACCESS LEFT  09/12/2020  . IR US GUIDE VASC ACCESS RIGHT  05/25/2017  . LIGATION OF ARTERIOVENOUS  FISTULA Left 01/10/2021   Procedure: LIGATION OF LEFT ARM RADIOCEPHALIC FISTULA;  Surgeon: Serafina Mitchell, MD;  Location: [facility];  Service: Vascular;  Laterality: Left;  . PERIPHERAL  VASCULAR BALLOON ANGIOPLASTY Left 01/15/2021   Procedure: PERIPHERAL VASCULAR BALLOON ANGIOPLASTY;  Surgeon: Serafina Mitchell, MD;  Location: [facility];  Service: Cardiovascular;  Laterality: Left;  AVF  . REVISON OF ARTERIOVENOUS FISTULA Left 07/18/2019   Procedure: REVISION PLICATION OF RADIOCEPHALIC ARTERIOVENOUS FISTULA LEFT ARM;  Surgeon: Angelia Mould, MD;  Location: [facility];  Service: Vascular;  Laterality: Left;  . REVISON OF ARTERIOVENOUS FISTULA Left 01/10/2021   Procedure: CONVERSION TO BRACHIOCEPHALIC ARTERIOVENOUS FISTULA;  Surgeon: Serafina Mitchell, MD;  Location: [facility];  Service: Vascular;  Laterality: Left;  . RINOPLASTY      Social:  Social History   Tobacco Use  . Smoking status: Never Smoker  . Smokeless tobacco: Never Used  Vaping Use  . Vaping Use: Never used  Substance Use Topics  . Alcohol use: Not Currently    Alcohol/week: 0.0 standard drinks    Comment: has a drink once in a while- 12/30/19  . Drug use: No     Family  History:  Family History  Problem Relation Age of Onset  . Cancer Mother      Allergies: Allergies as of 02/01/2021  . (No Known Allergies)    Review of Systems: A complete ROS was negative except as per HPI.   OBJECTIVE:   Physical Exam: Blood pressure (!) 147/67, pulse 74, temperature 98 F (36.7 C), temperature source Oral, resp. rate 16, height 5\' 8"  (1.727 m), weight 58.2 kg, SpO2 92 %.   General: underweight, resting in bed in no acute distress HE: Normocephalic, atraumatic , EOMI, Conjunctivae normal ENT: No congestion, no rhinorrhea, no exudate or erythema  Cardiovascular: Normal rate, regular rhythm.  No murmurs, rubs, or gallops Pulmonary : Effort normal, breath sounds normal. No wheezes, rales, or rhonchi Abdominal: soft, nontender,  bowel sounds present Musculoskeletal: Decreased strength throughout left upper extremity, no tenderness on palpation of left forearm, edema of left arm .  Palpable thrill of left upper extremity graft Skin:   .  eschar with surrounding erythema.  No drainage or crepitus.  Distal pulse 2+ radial, sensation intact distally  Psychiatric/Behavioral:  normal mood, normal behavior     Labs: CBC    Component Value Date/Time   WBC 4.7 02/01/2021 1300   RBC 2.48 (L) 02/01/2021 1300   HGB 8.2 (L) 02/01/2021 1300   HCT 26.0 (L) 02/01/2021 1300   HCT 27.3 (L) 05/17/2019 0203   PLT 192 02/01/2021 1300   MCV 104.8 (H) 02/01/2021 1300   MCH 33.1 02/01/2021 1300   MCHC 31.5 02/01/2021 1300   RDW 14.8 02/01/2021 1300   LYMPHSABS 0.4 (L) 02/01/2021 1300   MONOABS 0.3 02/01/2021 1300   EOSABS 0.2 02/01/2021 1300   BASOSABS 0.1 02/01/2021 1300     CMP     Component Value Date/Time   NA 137 02/01/2021 1300   NA 141 03/21/2020 1515   K 4.9 02/01/2021 1300   CL 93 (L) 02/01/2021 1300   CO2 28 02/01/2021 1300   GLUCOSE 81 02/01/2021 1300   BUN 37 (H) 02/01/2021 1300   BUN 53 (H) 03/21/2020 1515   CREATININE 7.45 (H) 02/01/2021 1300    CALCIUM 9.8 02/01/2021 1300   PROT 6.4 (L) 01/12/2021 1658   ALBUMIN 3.2 (L) 01/15/2021 0354   AST 19 01/12/2021 1658   ALT <5 01/12/2021 1658   ALKPHOS 71 01/12/2021 1658   BILITOT 1.4 (H) 01/12/2021 1658   GFRNONAA 8 (L) 02/01/2021 1300   GFRAA 4 (L) 05/30/2020  1643    Imaging: No results found.  EKG: personally reviewed my interpretation is sinus rhythm. Premature atrial complexes.    ASSESSMENT & PLAN:    Assessment & Plan by Problem: Active Problems:   * No active hospital problems. *   Thomas Robinson is a 60 y.o. with pertinent PMH of HTN, chronic diastolic heart failure, and end-stage renal disease on Monday Wednesday Friday hemodialysis, recent radiocephalic AV fistula placement and ligation of left radiocephalic fistula , who is being admitted for left upper extremity edema with left arm wound on hospital day 0   #Upper extremity edema #Left arm wound Vascular surgery consulted.  Plans for ligation of left arm AV fistula and debridement of left forearm and placement of tunnel dialysis catheter tomorrow. -Left arm wound appears to be improving from prior hospitalization photos.  Mild erythema but does not appear infected. -NPO at midnight  #ESRD on Monday Wednesday Friday -Last HD session 01/30/2021.  Nephrology can be consulted after access is established. No emergent needs for HD.   #Asthma -Continue home inhalers  #Hypertension SBP 142 on admission, continue home medication amlodipine 10 , hydralazine 50, metoprolol 50 mg BID  Diet: Carb-Modified VTE: Enoxaparin IVF: None,None Code: Full  Prior to Admission Living Arrangement: Home Anticipated Discharge Location: Home Barriers to Discharge: Surgery scheduled for 6/4  Dispo: Admit patient to Observation with expected length of stay less than 2 midnights.  Signed: Madalyn Rob, MD Internal Medicine Resident PGY-2 Pager: [phone]  02/01/2021, 3:09 PM

## 2021-02-01 NOTE — ED Notes (Signed)
Pt has a wound on left lower arm that is 3cmx4.6cm. The wound has a necrotic bed. Pt has 3+ swelling of left arm, 2+ left radial pulse, 2/5 left hand grip, arm is hot to touch, sensation intact. Pt is only able to partially lift left arm up due to pain.

## 2021-02-01 NOTE — Progress Notes (Signed)
Pt just arrived to unit and is c/o pain to Left arm at 10/10 and is asking for Dialudid. Pt stated that it really helped when he had it in the ED. Order was only for once, RN messaged MD on call to see if we can have an order. Awaiting response.   Eleanora Neighbor, RN

## 2021-02-01 NOTE — H&P (View-Only) (Signed)
Referring Physician: Beatty Dr. Zenia Resides  Patient name: Thomas Robinson MRN: [id number] DOB: 03-Dec-1960 Sex: male  REASON FOR CONSULT: Left arm swelling with painful fistula and arm wound  HPI: Thomas Robinson is a 60 y.o. male, known to our service who had a left brachiocephalic AV fistula placed ligation of a left radiocephalic fistula and angioplasty of his left subclavian vein by Dr. Trula Slade Jan 15, 2021.  Patient was seen in our office about 5 days ago complaining of arm swelling.  He was offered ligation of the fistula at that point but deferred.  He now returns to the ER with painful cannulation in his left upper arm fistula and a new wound on his left forearm after a recent fall his current dialysis day is Monday Wednesday Friday.  His last hemodialysis was Wednesday.  He states that at that dialysis session he had very prolonged bleeding after the session.  He has had some difficulty moving his left hand secondary to edema and pain.  He has not had any fever or chills.  Other medical problems include hyperlipidemia hypertension, prior catheter infection on the right side.  These are all currently stable.  Past Medical History:  Diagnosis Date  . Anaphylactic shock, unspecified, sequela 06/10/2019  . Arthritis   . Asthma, chronic, unspecified asthma severity, with acute exacerbation 10/23/2017  . CAP (community acquired pneumonia) 10/23/2017  . Carpal tunnel syndrome of right wrist 10/05/2018  . Cataract    right - removed by surgery  . Cerebral infarction due to thrombosis of cerebral artery ([place])   . Cervical disc herniation 01/04/2020  . Chronic diastolic heart failure ([place]) 04/13/2018  . Chronic low back pain 11/24/2019  . Constipation   . Cough    chronic cough  . Encounter for immunization 07/08/2017  . ESRD on hemodialysis ([place]) 02/16/2018   Dialysis  T Thus Sat  - [facility]   . Fall 06/04/2019  . GERD (gastroesophageal reflux disease) 06/04/2019  . GI bleeding  05/24/2017  . Gram-negative sepsis, unspecified ([place]) 09/05/2017  . History of fusion of cervical spine 03/26/2020  . Hyperlipidemia   . Hypertension   . Hypokalemia 06/04/2017  . Iron deficiency anemia, unspecified 09/09/2017  . Left shoulder pain 11/11/2019  . Lumbar radiculopathy 11/24/2019  . Lung contusion 06/04/2019  . LVH (left ventricular hypertrophy) due to hypertensive disease, with heart failure ([place]) 06/18/2020  . Macrocytic anemia 05/24/2017  . Moderate protein-calorie malnutrition ([place]) 06/06/2017  . Myofascial pain syndrome 06/12/2020  . Neuritis of right ulnar nerve 09/13/2018  . Non-compliance with renal dialysis ([place]) 02/08/2020  . Oxygen deficiency 12/30/2019   O2 sats on RA 87% at PAT appt   . Pain in joint of right elbow 09/13/2018  . Pulmonary hypertension ([place])   . Renovascular hypertension 06/22/2020  . Restless leg syndrome   . Rib fractures 06/2019   Right  . S/P cardiac cath 08/27/2020   normal coronary arteries.  . Secondary hyperparathyroidism of renal origin ([place]) 06/04/2017  . Thoracic ascending aortic aneurysm ([facility])    4.5 cm 06/04/19 CT  . Ulnar neuropathy 10/05/2018  . Volume overload 10/23/2017   Past Surgical History:  Procedure Laterality Date  . A/V FISTULAGRAM N/A 01/01/2021   Procedure: A/V FISTULAGRAM;  Surgeon: Serafina Mitchell, MD;  Location: [place] [facility];  Service: Cardiovascular;  Laterality: N/A;  . A/V FISTULAGRAM N/A 01/15/2021   Procedure: A/V [id number];  Surgeon: Serafina Mitchell, MD;  Location: [facility]  INVASIVE CV LAB;  Service: Cardiovascular;  Laterality: N/A;  . ANTERIOR CERVICAL DECOMP/DISCECTOMY FUSION N/A 01/04/2020   Procedure: ANTERIOR CERVICAL DECOMPRESSION/DISCECTOMY FUSION CERVICAL FIVE THROUGH SEVEN;  Surgeon: Melina Schools, MD;  Location: [place];  Service: Orthopedics;  Laterality: N/A;  3 hrs  . AV FISTULA PLACEMENT Left 05/28/2017   Procedure: LEFT ARM ARTERIOVENOUS (AV) FISTULA CREATION;  Surgeon: Conrad Gypsum, MD;  Location: [place];   Service: Vascular;  Laterality: Left;  . EYE SURGERY Right 06/02/2019   Cataract removed  . FRACTURE SURGERY    . IR DIALY SHUNT INTRO NEEDLE/INTRACATH INITIAL W/IMG LEFT Left 09/12/2020  . IR FLUORO GUIDE CV LINE RIGHT  05/25/2017  . IR US GUIDE VASC ACCESS LEFT  09/12/2020  . IR US GUIDE VASC ACCESS RIGHT  05/25/2017  . LIGATION OF ARTERIOVENOUS  FISTULA Left 01/10/2021   Procedure: LIGATION OF LEFT ARM RADIOCEPHALIC FISTULA;  Surgeon: Serafina Mitchell, MD;  Location: [place];  Service: Vascular;  Laterality: Left;  . PERIPHERAL VASCULAR BALLOON ANGIOPLASTY Left 01/15/2021   Procedure: PERIPHERAL VASCULAR BALLOON ANGIOPLASTY;  Surgeon: Serafina Mitchell, MD;  Location: [place] [facility];  Service: Cardiovascular;  Laterality: Left;  AVF  . REVISON OF ARTERIOVENOUS FISTULA Left 07/18/2019   Procedure: REVISION PLICATION OF RADIOCEPHALIC ARTERIOVENOUS FISTULA LEFT ARM;  Surgeon: Angelia Mould, MD;  Location: [place];  Service: Vascular;  Laterality: Left;  . REVISON OF ARTERIOVENOUS FISTULA Left 01/10/2021   Procedure: CONVERSION TO BRACHIOCEPHALIC ARTERIOVENOUS FISTULA;  Surgeon: Serafina Mitchell, MD;  Location: [place];  Service: Vascular;  Laterality: Left;  . RINOPLASTY      Family History  Problem Relation Age of Onset  . Cancer Mother     SOCIAL HISTORY: Social History   Socioeconomic History  . Marital status: Single    Spouse name: Not on file  . Number of children: Not on file  . Years of education: Not on file  . Highest education level: Not on file  Occupational History  . Not on file  Tobacco Use  . Smoking status: Never Smoker  . Smokeless tobacco: Never Used  Vaping Use  . Vaping Use: Never used  Substance and Sexual Activity  . Alcohol use: Not Currently    Alcohol/week: 0.0 standard drinks    Comment: has a drink once in a while- 12/30/19  . Drug use: No  . Sexual activity: Not Currently  Other Topics Concern  . Not on file  Social History Narrative    Lives with a roommate in a one story home.  Has 1 child.  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  Education: high school.   Social Determinants of Health   Financial Resource Strain: Not on file  Food Insecurity: Not on file  Transportation Needs: Not on file  Physical Activity: Not on file  Stress: Not on file  Social Connections: Not on file  Intimate Partner Violence: Not on file    No Known Allergies  Current Facility-Administered Medications  Medication Dose Route Frequency Provider Last Rate Last Admin  . 0.9 %  sodium chloride infusion   Intravenous Continuous Lacretia Leigh, MD      . vancomycin Alcus Dad) IVPB 1250 mg/250 mL  1,250 mg Intravenous Once Lacretia Leigh, MD       Current Outpatient Medications  Medication Sig Dispense Refill  . acetaminophen (TYLENOL) 500 MG tablet Take 500 mg by mouth every 6 (six) hours as needed for moderate pain or headache.    [name]  albuterol (VENTOLIN HFA) 108 (90 Base) MCG/ACT inhaler Inhale 2 puffs into the lungs in the morning and at bedtime.    [name] amLODipine (NORVASC) 10 MG tablet Take 10 mg by mouth daily.     [name] atorvastatin (LIPITOR) 20 MG tablet Take 20 mg by mouth daily.    [name] BREO ELLIPTA 100-25 MCG/INH AEPB Inhale 1 puff into the lungs 2 (two) times daily as needed (sob/wheezing).    . cholecalciferol (VITAMIN D3) 25 MCG (1000 UNIT) tablet Take 1,000 Units by mouth once a week.    [name] DIPHENHYDRAMINE HCL PO Take 25 mg by mouth as needed (allergies).    [name] doxercalciferol (HECTOROL) 0.5 MCG capsule Take 0.5 mcg by mouth 3 (three) times a week.    . furosemide (LASIX) 80 MG tablet Take 80 mg by mouth as directed. Take Daily PRN On Non-Dialysis Days (Sun, Tues, Thurs, Sat)    . gabapentin (NEURONTIN) 100 MG capsule Take 100 mg by mouth 3 (three) times daily.    . furosemide (LASIX) 40 MG tablet Take 1 tablet (40 mg total) by mouth See admin instructions. On Sunday, Monday, Wednesday, Thursday and Saturday (Patient not taking: Reported on  02/01/2021) 60 tablet 2  . hydrALAZINE (APRESOLINE) 25 MG tablet Take 50 mg by mouth in the morning and at bedtime.    . metoprolol tartrate (LOPRESSOR) 100 MG tablet Take 0.5 tablets (50 mg total) by mouth 2 (two) times daily.    . multivitamin (RENA-VIT) TABS tablet Take 1 tablet by mouth daily.    . pregabalin (LYRICA) 25 MG capsule Take 25 mg by mouth daily as needed (pain).    [name] sevelamer carbonate (RENVELA) 800 MG tablet Take 800 mg by mouth daily.    [name] Specialty Vitamins Products (HEALTHY HEART COMPLEX PO) Take 1 tablet by mouth daily.    . traMADol (ULTRAM) 50 MG tablet Take 1 tablet (50 mg total) by mouth every 6 (six) hours as needed for moderate pain. 20 tablet 0    ROS:   General:  No weight loss, Fever, chills  HEENT: No recent headaches, no nasal bleeding, no visual changes, no sore throat  Neurologic: No dizziness, blackouts, seizures. No recent symptoms of stroke or mini- stroke. No recent episodes of slurred speech, or temporary blindness.  Cardiac: No recent episodes of chest pain/pressure, no shortness of breath at rest.  No shortness of breath with exertion.  Denies history of atrial fibrillation or irregular heartbeat  Vascular: No history of rest pain in feet.  No history of claudication.  No history of non-healing ulcer, No history of DVT   Pulmonary: No home oxygen, no productive cough, no hemoptysis,  No asthma or wheezing  Musculoskeletal:  [ ]  Arthritis, [ ]  Low back pain,  [ ]  Joint pain  Hematologic:No history of hypercoagulable state.  No history of easy bleeding.  No history of anemia  Gastrointestinal: No hematochezia or melena,  No gastroesophageal reflux, no trouble swallowing  Urinary: [X]  chronic Kidney disease, [X]  on HD - [X]  MWF or [ ]  TTHS, [ ]  Burning with urination, [ ]  Frequent urination, [ ]  Difficulty urinating;   Skin: No rashes  Psychological: No history of anxiety,  No history of depression   Physical Examination  Vitals:    02/01/21 1239 02/01/21 1245 02/01/21 1253 02/01/21 1435  BP:  (!) 160/82  (!) 147/67  Pulse:  82  74  Resp:  (!) 22  16  Temp:      TempSrc:  SpO2:  94% 93% 92%  Weight: 58.2 kg     Height: 5\' 8"  (1.727 m)       Body mass index is 19.51 kg/m.  General:  Alert and oriented, no acute distress HEENT: Normal Neck: No JVD Cardiac: Regular Rate and Rhythm Skin: No rash, abrasion left dorsal forearm with some some dried blood and eschar mild erythema edema extending from the shoulder down into the left hand Extremity Pulses: 2+ right brachial radial, 1+ left radial palpable thrill left upper arm AV fistula Musculoskeletal: Left arm edema as mentioned above Neurologic: Upper extremity left side motor 5/5 in the upper arm 4/5 hand intrinsics left hand  DATA:  Hemoglobin 8.2  BMET    Component Value Date/Time   NA 137 02/01/2021 1300   NA 141 03/21/2020 1515   K 4.9 02/01/2021 1300   CL 93 (L) 02/01/2021 1300   CO2 28 02/01/2021 1300   GLUCOSE 81 02/01/2021 1300   BUN 37 (H) 02/01/2021 1300   BUN 53 (H) 03/21/2020 1515   CREATININE 7.45 (H) 02/01/2021 1300   CALCIUM 9.8 02/01/2021 1300   GFRNONAA 8 (L) 02/01/2021 1300   GFRAA 4 (L) 05/30/2020 1643   COVID test is pending   ASSESSMENT: Venous hypertension secondary to recurrent central vein stenosis now with nonhealing wound left forearm and painful cannulation with the AV fistula.  Patient is now consenting to have the fistula ligated and a catheter placed.  I discussed with him the possibility of placing another long-term hemodialysis access but we would address that as an outpatient.   PLAN: Ligation left arm AV fistula, debridement left forearm and placement of tunneled dialysis catheter tomorrow in the operating room.  We will plan for general anesthesia as the arm will probably not be very responsive to local anesthetic due to edema and inflammation.  N.p.o. after midnight, consent  Ruta Hinds, MD [facility] Office: [phone]

## 2021-02-01 NOTE — ED Notes (Signed)
Request for transport IN

## 2021-02-01 NOTE — Progress Notes (Signed)
New Admission Note:  Arrival Method: By bed from ED around 2000 Mental Orientation: Alert and oriented Telemetry: None Assessment: Completed Skin: Completed, refer to flowsheets IV: Right forearm Pain: 10/10 Left arm Tubes: None Safety Measures: Safety Fall Prevention Plan was given, discussed and signed. Admission: Completed [facility] Orientation: Patient has been orientated to the room, unit and the staff. Family: None  Orders have been reviewed and implemented. Will continue to monitor the patient. Call light has been placed within reach and bed alarm has been activated.   Perry Mount, RN  Phone Number: [phone]

## 2021-02-01 NOTE — Anesthesia Preprocedure Evaluation (Addendum)
Anesthesia Evaluation  Patient identified by MRN, date of birth, ID band Patient awake    Reviewed: Allergy & Precautions, NPO status , Patient's Chart, lab work & pertinent test results, reviewed documented beta blocker date and time   Airway Mallampati: II  TM Distance: >3 FB Neck ROM: Limited    Dental  (+) Edentulous Lower, Dental Advisory Given   Pulmonary asthma ,    Pulmonary exam normal        Cardiovascular hypertension, Pt. on medications and Pt. on home beta blockers +CHF   Rhythm:Regular Rate:Normal     Neuro/Psych CVA negative psych ROS   GI/Hepatic GERD  ,  Endo/Other    Renal/GU ESRF and DialysisRenal disease     Musculoskeletal  (+) Arthritis ,   Abdominal Normal abdominal exam  (+)   Peds  Hematology  (+) anemia ,   Anesthesia Other Findings   Reproductive/Obstetrics                           Anesthesia Physical Anesthesia Plan  ASA: III  Anesthesia Plan: General   Post-op Pain Management:    Induction: Intravenous  PONV Risk Score and Plan: 3 and Ondansetron, Midazolam and Dexamethasone  Airway Management Planned: Oral ETT and LMA  Additional Equipment: None  Intra-op Plan:   Post-operative Plan: Extubation in OR  Informed Consent: I have reviewed the patients History and Physical, chart, labs and discussed the procedure including the risks, benefits and alternatives for the proposed anesthesia with the patient or authorized representative who has indicated his/her understanding and acceptance.     Dental advisory given  Plan Discussed with: CRNA and Anesthesiologist  Anesthesia Plan Comments:        Anesthesia Quick Evaluation

## 2021-02-01 NOTE — Consult Note (Addendum)
Referring Physician: [place] Dr. Zenia Resides  Patient name: Thomas Robinson MRN: [id number] DOB: February 12, 1961 Sex: male  REASON FOR CONSULT: Left arm swelling with painful fistula and arm wound  HPI: Thomas Robinson is a 60 y.o. male, known to our service who had a left brachiocephalic AV fistula placed ligation of a left radiocephalic fistula and angioplasty of his left subclavian vein by Dr. Trula Slade Jan 15, 2021.  Patient was seen in our office about 5 days ago complaining of arm swelling.  He was offered ligation of the fistula at that point but deferred.  He now returns to the ER with painful cannulation in his left upper arm fistula and a new wound on his left forearm after a recent fall his current dialysis day is Monday Wednesday Friday.  His last hemodialysis was Wednesday.  He states that at that dialysis session he had very prolonged bleeding after the session.  He has had some difficulty moving his left hand secondary to edema and pain.  He has not had any fever or chills.  Other medical problems include hyperlipidemia hypertension, prior catheter infection on the right side.  These are all currently stable.  Past Medical History:  Diagnosis Date  . Anaphylactic shock, unspecified, sequela 06/10/2019  . Arthritis   . Asthma, chronic, unspecified asthma severity, with acute exacerbation 10/23/2017  . CAP (community acquired pneumonia) 10/23/2017  . Carpal tunnel syndrome of right wrist 10/05/2018  . Cataract    right - removed by surgery  . Cerebral infarction due to thrombosis of cerebral artery ([place])   . Cervical disc herniation 01/04/2020  . Chronic diastolic heart failure ([place]) 04/13/2018  . Chronic Robinson back pain 11/24/2019  . Constipation   . Cough    chronic cough  . Encounter for immunization 07/08/2017  . ESRD on hemodialysis ([place]) 02/16/2018   Dialysis  T Thus Sat  - [facility]   . Fall 06/04/2019  . GERD (gastroesophageal reflux disease) 06/04/2019  . GI bleeding  05/24/2017  . Gram-negative sepsis, unspecified ([place]) 09/05/2017  . History of fusion of cervical spine 03/26/2020  . Hyperlipidemia   . Hypertension   . Hypokalemia 06/04/2017  . Iron deficiency anemia, unspecified 09/09/2017  . Left shoulder pain 11/11/2019  . Lumbar radiculopathy 11/24/2019  . Lung contusion 06/04/2019  . LVH (left ventricular hypertrophy) due to hypertensive disease, with heart failure ([place]) 06/18/2020  . Macrocytic anemia 05/24/2017  . Moderate protein-calorie malnutrition ([place]) 06/06/2017  . Myofascial pain syndrome 06/12/2020  . Neuritis of right ulnar nerve 09/13/2018  . Non-compliance with renal dialysis ([place]) 02/08/2020  . Oxygen deficiency 12/30/2019   O2 sats on RA 87% at PAT appt   . Pain in joint of right elbow 09/13/2018  . Pulmonary hypertension ([place])   . Renovascular hypertension 06/22/2020  . Restless leg syndrome   . Rib fractures 06/2019   Right  . S/P cardiac cath 08/27/2020   normal coronary arteries.  . Secondary hyperparathyroidism of renal origin ([place]) 06/04/2017  . Thoracic ascending aortic aneurysm ([facility])    4.5 cm 06/04/19 CT  . Ulnar neuropathy 10/05/2018  . Volume overload 10/23/2017   Past Surgical History:  Procedure Laterality Date  . A/V FISTULAGRAM N/A 01/01/2021   Procedure: A/V FISTULAGRAM;  Surgeon: Serafina Mitchell, MD;  Location: [facility];  Service: Cardiovascular;  Laterality: N/A;  . A/V FISTULAGRAM N/A 01/15/2021   Procedure: A/V [id number];  Surgeon: Serafina Mitchell, MD;  Location: [facility]  INVASIVE CV LAB;  Service: Cardiovascular;  Laterality: N/A;  . ANTERIOR CERVICAL DECOMP/DISCECTOMY FUSION N/A 01/04/2020   Procedure: ANTERIOR CERVICAL DECOMPRESSION/DISCECTOMY FUSION CERVICAL FIVE THROUGH SEVEN;  Surgeon: Melina Schools, MD;  Location: [place];  Service: Orthopedics;  Laterality: N/A;  3 hrs  . AV FISTULA PLACEMENT Left 05/28/2017   Procedure: LEFT ARM ARTERIOVENOUS (AV) FISTULA CREATION;  Surgeon: Conrad [place], MD;  Location: [facility];   Service: Vascular;  Laterality: Left;  . EYE SURGERY Right 06/02/2019   Cataract removed  . FRACTURE SURGERY    . IR DIALY SHUNT INTRO NEEDLE/INTRACATH INITIAL W/IMG LEFT Left 09/12/2020  . IR FLUORO GUIDE CV LINE RIGHT  05/25/2017  . IR US GUIDE VASC ACCESS LEFT  09/12/2020  . IR US GUIDE VASC ACCESS RIGHT  05/25/2017  . LIGATION OF ARTERIOVENOUS  FISTULA Left 01/10/2021   Procedure: LIGATION OF LEFT ARM RADIOCEPHALIC FISTULA;  Surgeon: Serafina Mitchell, MD;  Location: [facility];  Service: Vascular;  Laterality: Left;  . PERIPHERAL VASCULAR BALLOON ANGIOPLASTY Left 01/15/2021   Procedure: PERIPHERAL VASCULAR BALLOON ANGIOPLASTY;  Surgeon: Serafina Mitchell, MD;  Location: [facility];  Service: Cardiovascular;  Laterality: Left;  AVF  . REVISON OF ARTERIOVENOUS FISTULA Left 07/18/2019   Procedure: REVISION PLICATION OF RADIOCEPHALIC ARTERIOVENOUS FISTULA LEFT ARM;  Surgeon: Angelia Mould, MD;  Location: [facility];  Service: Vascular;  Laterality: Left;  . REVISON OF ARTERIOVENOUS FISTULA Left 01/10/2021   Procedure: CONVERSION TO BRACHIOCEPHALIC ARTERIOVENOUS FISTULA;  Surgeon: Serafina Mitchell, MD;  Location: [facility];  Service: Vascular;  Laterality: Left;  . RINOPLASTY      Family History  Problem Relation Age of Onset  . Cancer Mother     SOCIAL HISTORY: Social History   Socioeconomic History  . Marital status: Single    Spouse name: Not on file  . Number of children: Not on file  . Years of education: Not on file  . Highest education level: Not on file  Occupational History  . Not on file  Tobacco Use  . Smoking status: Never Smoker  . Smokeless tobacco: Never Used  Vaping Use  . Vaping Use: Never used  Substance and Sexual Activity  . Alcohol use: Not Currently    Alcohol/week: 0.0 standard drinks    Comment: has a drink once in a while- 12/30/19  . Drug use: No  . Sexual activity: Not Currently  Other Topics Concern  . Not on file  Social History Narrative    Lives with a roommate in a one story home.  Has 1 child.  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  Education: high school.   Social Determinants of Health   Financial Resource Strain: Not on file  Food Insecurity: Not on file  Transportation Needs: Not on file  Physical Activity: Not on file  Stress: Not on file  Social Connections: Not on file  Intimate Partner Violence: Not on file    No Known Allergies  Current Facility-Administered Medications  Medication Dose Route Frequency Provider Last Rate Last Admin  . 0.9 %  sodium chloride infusion   Intravenous Continuous Lacretia Leigh, MD      . vancomycin Alcus Dad) IVPB 1250 mg/250 mL  1,250 mg Intravenous Once Lacretia Leigh, MD       Current Outpatient Medications  Medication Sig Dispense Refill  . acetaminophen (TYLENOL) 500 MG tablet Take 500 mg by mouth every 6 (six) hours as needed for moderate pain or headache.    [name]  albuterol (VENTOLIN HFA) 108 (90 Base) MCG/ACT inhaler Inhale 2 puffs into the lungs in the morning and at bedtime.    [name] amLODipine (NORVASC) 10 MG tablet Take 10 mg by mouth daily.     [name] atorvastatin (LIPITOR) 20 MG tablet Take 20 mg by mouth daily.    [name] BREO ELLIPTA 100-25 MCG/INH AEPB Inhale 1 puff into the lungs 2 (two) times daily as needed (sob/wheezing).    . cholecalciferol (VITAMIN D3) 25 MCG (1000 UNIT) tablet Take 1,000 Units by mouth once a week.    [name] DIPHENHYDRAMINE HCL PO Take 25 mg by mouth as needed (allergies).    [name] doxercalciferol (HECTOROL) 0.5 MCG capsule Take 0.5 mcg by mouth 3 (three) times a week.    . furosemide (LASIX) 80 MG tablet Take 80 mg by mouth as directed. Take Daily PRN On Non-Dialysis Days (Sun, Tues, Thurs, Sat)    . gabapentin (NEURONTIN) 100 MG capsule Take 100 mg by mouth 3 (three) times daily.    . furosemide (LASIX) 40 MG tablet Take 1 tablet (40 mg total) by mouth See admin instructions. On Sunday, Monday, Wednesday, Thursday and Saturday (Patient not taking: Reported on  02/01/2021) 60 tablet 2  . hydrALAZINE (APRESOLINE) 25 MG tablet Take 50 mg by mouth in the morning and at bedtime.    . metoprolol tartrate (LOPRESSOR) 100 MG tablet Take 0.5 tablets (50 mg total) by mouth 2 (two) times daily.    . multivitamin (RENA-VIT) TABS tablet Take 1 tablet by mouth daily.    . pregabalin (LYRICA) 25 MG capsule Take 25 mg by mouth daily as needed (pain).    [name] sevelamer carbonate (RENVELA) 800 MG tablet Take 800 mg by mouth daily.    [name] Specialty Vitamins Products (HEALTHY HEART COMPLEX PO) Take 1 tablet by mouth daily.    . traMADol (ULTRAM) 50 MG tablet Take 1 tablet (50 mg total) by mouth every 6 (six) hours as needed for moderate pain. 20 tablet 0    ROS:   General:  No weight loss, Fever, chills  HEENT: No recent headaches, no nasal bleeding, no visual changes, no sore throat  Neurologic: No dizziness, blackouts, seizures. No recent symptoms of stroke or mini- stroke. No recent episodes of slurred speech, or temporary blindness.  Cardiac: No recent episodes of chest pain/pressure, no shortness of breath at rest.  No shortness of breath with exertion.  Denies history of atrial fibrillation or irregular heartbeat  Vascular: No history of rest pain in feet.  No history of claudication.  No history of non-healing ulcer, No history of DVT   Pulmonary: No home oxygen, no productive cough, no hemoptysis,  No asthma or wheezing  Musculoskeletal:  [ ]  Arthritis, [ ]  Robinson back pain,  [ ]  Joint pain  Hematologic:No history of hypercoagulable state.  No history of easy bleeding.  No history of anemia  Gastrointestinal: No hematochezia or melena,  No gastroesophageal reflux, no trouble swallowing  Urinary: [X]  chronic Kidney disease, [X]  on HD - [X]  MWF or [ ]  TTHS, [ ]  Burning with urination, [ ]  Frequent urination, [ ]  Difficulty urinating;   Skin: No rashes  Psychological: No history of anxiety,  No history of depression   Physical Examination  Vitals:    02/01/21 1239 02/01/21 1245 02/01/21 1253 02/01/21 1435  BP:  (!) 160/82  (!) 147/67  Pulse:  82  74  Resp:  (!) 22  16  Temp:      TempSrc:  SpO2:  94% 93% 92%  Weight: 58.2 kg     Height: 5\' 8"  (1.727 m)       Body mass index is 19.51 kg/m.  General:  Alert and oriented, no acute distress HEENT: Normal Neck: No JVD Cardiac: Regular Rate and Rhythm Skin: No rash, abrasion left dorsal forearm with some some dried blood and eschar mild erythema edema extending from the shoulder down into the left hand Extremity Pulses: 2+ right brachial radial, 1+ left radial palpable thrill left upper arm AV fistula Musculoskeletal: Left arm edema as mentioned above Neurologic: Upper extremity left side motor 5/5 in the upper arm 4/5 hand intrinsics left hand  DATA:  Hemoglobin 8.2  BMET    Component Value Date/Time   NA 137 02/01/2021 1300   NA 141 03/21/2020 1515   K 4.9 02/01/2021 1300   CL 93 (L) 02/01/2021 1300   CO2 28 02/01/2021 1300   GLUCOSE 81 02/01/2021 1300   BUN 37 (H) 02/01/2021 1300   BUN 53 (H) 03/21/2020 1515   CREATININE 7.45 (H) 02/01/2021 1300   CALCIUM 9.8 02/01/2021 1300   GFRNONAA 8 (L) 02/01/2021 1300   GFRAA 4 (L) 05/30/2020 1643   COVID test is pending   ASSESSMENT: Venous hypertension secondary to recurrent central vein stenosis now with nonhealing wound left forearm and painful cannulation with the AV fistula.  Patient is now consenting to have the fistula ligated and a catheter placed.  I discussed with him the possibility of placing another long-term hemodialysis access but we would address that as an outpatient.   PLAN: Ligation left arm AV fistula, debridement left forearm and placement of tunneled dialysis catheter tomorrow in the operating room.  We will plan for general anesthesia as the arm will probably not be very responsive to local anesthetic due to edema and inflammation.  N.p.o. after midnight, consent  Ruta Hinds, MD [facility] Office: [phone]

## 2021-02-01 NOTE — ED Notes (Signed)
Attempted to give reportx1 

## 2021-02-02 ENCOUNTER — Observation Stay (HOSPITAL_COMMUNITY): Payer: Medicare Other | Admitting: Anesthesiology

## 2021-02-02 ENCOUNTER — Observation Stay (HOSPITAL_COMMUNITY): Payer: Medicare Other

## 2021-02-02 ENCOUNTER — Encounter (HOSPITAL_COMMUNITY): Admission: EM | Disposition: A | Discharge status: Home / Self Care | Payer: Self-pay | Attending: Internal Medicine

## 2021-02-02 ENCOUNTER — Encounter (HOSPITAL_COMMUNITY): Payer: Self-pay | Admitting: Internal Medicine

## 2021-02-02 DIAGNOSIS — D62 Acute posthemorrhagic anemia: Secondary | ICD-10-CM | POA: Diagnosis present

## 2021-02-02 DIAGNOSIS — I97621 Postprocedural hematoma of a circulatory system organ or structure following other procedure: Secondary | ICD-10-CM

## 2021-02-02 DIAGNOSIS — E440 Moderate protein-calorie malnutrition: Secondary | ICD-10-CM | POA: Diagnosis present

## 2021-02-02 DIAGNOSIS — Z681 Body mass index (BMI) 19 or less, adult: Secondary | ICD-10-CM | POA: Diagnosis not present

## 2021-02-02 DIAGNOSIS — E8889 Other specified metabolic disorders: Secondary | ICD-10-CM | POA: Diagnosis present

## 2021-02-02 DIAGNOSIS — I132 Hypertensive heart and chronic kidney disease with heart failure and with stage 5 chronic kidney disease, or end stage renal disease: Secondary | ICD-10-CM | POA: Diagnosis present

## 2021-02-02 DIAGNOSIS — T82898S Other specified complication of vascular prosthetic devices, implants and grafts, sequela: Secondary | ICD-10-CM

## 2021-02-02 DIAGNOSIS — N186 End stage renal disease: Secondary | ICD-10-CM | POA: Diagnosis present

## 2021-02-02 DIAGNOSIS — Z981 Arthrodesis status: Secondary | ICD-10-CM | POA: Diagnosis not present

## 2021-02-02 DIAGNOSIS — Z87892 Personal history of anaphylaxis: Secondary | ICD-10-CM | POA: Diagnosis not present

## 2021-02-02 DIAGNOSIS — D631 Anemia in chronic kidney disease: Secondary | ICD-10-CM | POA: Diagnosis present

## 2021-02-02 DIAGNOSIS — J45902 Unspecified asthma with status asthmaticus: Secondary | ICD-10-CM

## 2021-02-02 DIAGNOSIS — L0390 Cellulitis, unspecified: Secondary | ICD-10-CM | POA: Diagnosis not present

## 2021-02-02 DIAGNOSIS — J9601 Acute respiratory failure with hypoxia: Secondary | ICD-10-CM | POA: Diagnosis not present

## 2021-02-02 DIAGNOSIS — W19XXXA Unspecified fall, initial encounter: Secondary | ICD-10-CM | POA: Diagnosis present

## 2021-02-02 DIAGNOSIS — Z992 Dependence on renal dialysis: Secondary | ICD-10-CM | POA: Diagnosis not present

## 2021-02-02 DIAGNOSIS — J9621 Acute and chronic respiratory failure with hypoxia: Secondary | ICD-10-CM | POA: Diagnosis present

## 2021-02-02 DIAGNOSIS — Z20822 Contact with and (suspected) exposure to covid-19: Secondary | ICD-10-CM | POA: Diagnosis present

## 2021-02-02 DIAGNOSIS — I5032 Chronic diastolic (congestive) heart failure: Secondary | ICD-10-CM | POA: Diagnosis present

## 2021-02-02 DIAGNOSIS — I10 Essential (primary) hypertension: Secondary | ICD-10-CM

## 2021-02-02 DIAGNOSIS — J45909 Unspecified asthma, uncomplicated: Secondary | ICD-10-CM | POA: Diagnosis present

## 2021-02-02 DIAGNOSIS — T82898A Other specified complication of vascular prosthetic devices, implants and grafts, initial encounter: Secondary | ICD-10-CM | POA: Diagnosis present

## 2021-02-02 DIAGNOSIS — L03114 Cellulitis of left upper limb: Secondary | ICD-10-CM | POA: Diagnosis present

## 2021-02-02 DIAGNOSIS — I96 Gangrene, not elsewhere classified: Secondary | ICD-10-CM | POA: Diagnosis present

## 2021-02-02 DIAGNOSIS — Y835 Amputation of limb(s) as the cause of abnormal reaction of the patient, or of later complication, without mention of misadventure at the time of the procedure: Secondary | ICD-10-CM

## 2021-02-02 DIAGNOSIS — Z8673 Personal history of transient ischemic attack (TIA), and cerebral infarction without residual deficits: Secondary | ICD-10-CM | POA: Diagnosis not present

## 2021-02-02 DIAGNOSIS — S5012XA Contusion of left forearm, initial encounter: Secondary | ICD-10-CM | POA: Diagnosis present

## 2021-02-02 DIAGNOSIS — T8789 Other complications of amputation stump: Secondary | ICD-10-CM

## 2021-02-02 DIAGNOSIS — Y832 Surgical operation with anastomosis, bypass or graft as the cause of abnormal reaction of the patient, or of later complication, without mention of misadventure at the time of the procedure: Secondary | ICD-10-CM | POA: Diagnosis present

## 2021-02-02 DIAGNOSIS — N2581 Secondary hyperparathyroidism of renal origin: Secondary | ICD-10-CM | POA: Diagnosis present

## 2021-02-02 DIAGNOSIS — I871 Compression of vein: Secondary | ICD-10-CM | POA: Diagnosis present

## 2021-02-02 DIAGNOSIS — Z9981 Dependence on supplemental oxygen: Secondary | ICD-10-CM | POA: Diagnosis not present

## 2021-02-02 DIAGNOSIS — Z79899 Other long term (current) drug therapy: Secondary | ICD-10-CM | POA: Diagnosis not present

## 2021-02-02 HISTORY — PX: INSERTION OF DIALYSIS CATHETER: SHX1324

## 2021-02-02 HISTORY — PX: AV FISTULA PLACEMENT: SHX1204

## 2021-02-02 LAB — POCT I-STAT, CHEM 8
BUN: 35 mg/dL — ABNORMAL HIGH (ref 8–23)
BUN: 35 mg/dL — ABNORMAL HIGH (ref 8–23)
Calcium, Ion: 1.08 mmol/L — ABNORMAL LOW (ref 1.15–1.40)
Calcium, Ion: 1.04 mmol/L — ABNORMAL LOW (ref 1.15–1.40)
Chloride: 102 mmol/L (ref 98–111)
Chloride: 101 mmol/L (ref 98–111)
Creatinine, Ser: 7.5 mg/dL — ABNORMAL HIGH (ref 0.61–1.24)
Creatinine, Ser: 7.6 mg/dL — ABNORMAL HIGH (ref 0.61–1.24)
Glucose, Bld: 66 mg/dL — ABNORMAL LOW (ref 70–99)
Glucose, Bld: 66 mg/dL — ABNORMAL LOW (ref 70–99)
HCT: 22 % — ABNORMAL LOW (ref 39.0–52.0)
HCT: 18 % — ABNORMAL LOW (ref 39.0–52.0)
Hemoglobin: 7.5 g/dL — ABNORMAL LOW (ref 13.0–17.0)
Hemoglobin: 6.1 g/dL — CL (ref 13.0–17.0)
Potassium: 4.2 mmol/L (ref 3.5–5.1)
Potassium: 4.2 mmol/L (ref 3.5–5.1)
Sodium: 139 mmol/L (ref 135–145)
Sodium: 139 mmol/L (ref 135–145)
TCO2: 24 mmol/L (ref 22–32)
TCO2: 24 mmol/L (ref 22–32)

## 2021-02-02 LAB — RENAL FUNCTION PANEL
Albumin: 2.8 g/dL — ABNORMAL LOW (ref 3.5–5.0)
Anion gap: 16 — ABNORMAL HIGH (ref 5–15)
BUN: 46 mg/dL — ABNORMAL HIGH (ref 8–23)
CO2: 28 mmol/L (ref 22–32)
Calcium: 9.6 mg/dL (ref 8.9–10.3)
Chloride: 94 mmol/L — ABNORMAL LOW (ref 98–111)
Creatinine, Ser: 9.12 mg/dL — ABNORMAL HIGH (ref 0.61–1.24)
GFR, Estimated: 6 mL/min — ABNORMAL LOW (ref >60)
Glucose, Bld: 139 mg/dL — ABNORMAL HIGH (ref 70–99)
Phosphorus: 8.9 mg/dL — ABNORMAL HIGH (ref 2.5–4.6)
Potassium: 5.5 mmol/L — ABNORMAL HIGH (ref 3.5–5.1)
Sodium: 138 mmol/L (ref 135–145)

## 2021-02-02 LAB — HEMOGLOBIN AND HEMATOCRIT, BLOOD
HCT: 25.4 % — ABNORMAL LOW (ref 39.0–52.0)
Hemoglobin: 8.1 g/dL — ABNORMAL LOW (ref 13.0–17.0)

## 2021-02-02 LAB — CBC
HCT: 25.5 % — ABNORMAL LOW (ref 39.0–52.0)
Hemoglobin: 8.2 g/dL — ABNORMAL LOW (ref 13.0–17.0)
MCH: 33.2 pg (ref 26.0–34.0)
MCHC: 32.2 g/dL (ref 30.0–36.0)
MCV: 103.2 fL — ABNORMAL HIGH (ref 80.0–100.0)
Platelets: 172 10*3/uL (ref 150–400)
RBC: 2.47 MIL/uL — ABNORMAL LOW (ref 4.22–5.81)
RDW: 14.7 % (ref 11.5–15.5)
WBC: 4 10*3/uL (ref 4.0–10.5)
nRBC: 0 % (ref 0.0–0.2)

## 2021-02-02 LAB — MAGNESIUM: Magnesium: 2.7 mg/dL — ABNORMAL HIGH (ref 1.7–2.4)

## 2021-02-02 LAB — SURGICAL PCR SCREEN
MRSA, PCR: NEGATIVE
Staphylococcus aureus: NEGATIVE

## 2021-02-02 LAB — PREPARE RBC (CROSSMATCH)

## 2021-02-02 SURGERY — ARTERIOVENOUS (AV) FISTULA CREATION
Anesthesia: General | Site: Chest | Laterality: Right

## 2021-02-02 MED ORDER — ONDANSETRON HCL 4 MG/2ML IJ SOLN
INTRAMUSCULAR | Status: DC | PRN
  Administered 2021-02-02: 4 mg via INTRAVENOUS

## 2021-02-02 MED ORDER — ACETAMINOPHEN 10 MG/ML IV SOLN: 1000.0000 mg | Freq: Once | INTRAVENOUS | Status: DC | PRN

## 2021-02-02 MED ORDER — DEXAMETHASONE SODIUM PHOSPHATE 10 MG/ML IJ SOLN
INTRAMUSCULAR | Status: AC
  Filled 2021-02-02: qty 1

## 2021-02-02 MED ORDER — HEPARIN SODIUM (PORCINE) 1000 UNIT/ML IJ SOLN
INTRAMUSCULAR | Status: DC | PRN
  Administered 2021-02-02: 3.2 [IU] via INTRAVENOUS

## 2021-02-02 MED ORDER — CHLORHEXIDINE GLUCONATE CLOTH 2 % EX PADS
6.0000 | MEDICATED_PAD | Freq: Every day | CUTANEOUS | Status: DC
  Administered 2021-02-02 – 2021-02-03 (×2): 6 via TOPICAL

## 2021-02-02 MED ORDER — CHLORHEXIDINE GLUCONATE 0.12 % MT SOLN
15.0000 mL | Freq: Once | OROMUCOSAL | Status: AC
Start: 2021-02-02 — End: 2021-02-02

## 2021-02-02 MED ORDER — FENTANYL CITRATE (PF) 100 MCG/2ML IJ SOLN
INTRAMUSCULAR | Status: AC
  Filled 2021-02-02: qty 2

## 2021-02-02 MED ORDER — CEFAZOLIN SODIUM-DEXTROSE 1-4 GM/50ML-% IV SOLN
INTRAVENOUS | Status: DC | PRN
  Administered 2021-02-02: 1 g via INTRAVENOUS

## 2021-02-02 MED ORDER — DEXAMETHASONE SODIUM PHOSPHATE 10 MG/ML IJ SOLN
INTRAMUSCULAR | Status: DC | PRN
  Administered 2021-02-02: 5 mg via INTRAVENOUS

## 2021-02-02 MED ORDER — SODIUM CHLORIDE 0.9 % IV SOLN: INTRAVENOUS | Status: DC | PRN

## 2021-02-02 MED ORDER — LIDOCAINE-EPINEPHRINE (PF) 1 %-1:200000 IJ SOLN
INTRAMUSCULAR | Status: AC
  Filled 2021-02-02: qty 30

## 2021-02-02 MED ORDER — OXYCODONE HCL 5 MG PO TABS: 5.0000 mg | ORAL_TABLET | Freq: Once | ORAL | Status: DC | PRN

## 2021-02-02 MED ORDER — OXYCODONE HCL 5 MG/5ML PO SOLN: 5.0000 mg | Freq: Once | ORAL | Status: DC | PRN

## 2021-02-02 MED ORDER — CHLORHEXIDINE GLUCONATE 0.12 % MT SOLN
OROMUCOSAL | Status: AC
  Administered 2021-02-02: 15 mL via OROMUCOSAL
  Filled 2021-02-02: qty 15

## 2021-02-02 MED ORDER — HEPARIN SODIUM (PORCINE) 1000 UNIT/ML IJ SOLN
INTRAMUSCULAR | Status: AC
  Filled 2021-02-02: qty 1

## 2021-02-02 MED ORDER — 0.9 % SODIUM CHLORIDE (POUR BTL) OPTIME
TOPICAL | Status: DC | PRN
  Administered 2021-02-02: 1000 mL

## 2021-02-02 MED ORDER — SODIUM CHLORIDE 0.9 % IV SOLN: INTRAVENOUS | Status: DC

## 2021-02-02 MED ORDER — PHENYLEPHRINE 40 MCG/ML (10ML) SYRINGE FOR IV PUSH (FOR BLOOD PRESSURE SUPPORT)
PREFILLED_SYRINGE | INTRAVENOUS | Status: DC | PRN
  Administered 2021-02-02: 80 ug via INTRAVENOUS

## 2021-02-02 MED ORDER — MIDAZOLAM HCL 2 MG/2ML IJ SOLN
INTRAMUSCULAR | Status: AC
  Filled 2021-02-02: qty 2

## 2021-02-02 MED ORDER — PROMETHAZINE HCL 25 MG/ML IJ SOLN: 6.2500 mg | INTRAMUSCULAR | Status: DC | PRN

## 2021-02-02 MED ORDER — FENTANYL CITRATE (PF) 250 MCG/5ML IJ SOLN
INTRAMUSCULAR | Status: AC
  Filled 2021-02-02: qty 5

## 2021-02-02 MED ORDER — PROPOFOL 10 MG/ML IV BOLUS
INTRAVENOUS | Status: AC
  Filled 2021-02-02: qty 20

## 2021-02-02 MED ORDER — SODIUM CHLORIDE 0.9% IV SOLUTION: Freq: Once | INTRAVENOUS | Status: DC

## 2021-02-02 MED ORDER — AMISULPRIDE (ANTIEMETIC) 5 MG/2ML IV SOLN: 10.0000 mg | Freq: Once | INTRAVENOUS | Status: DC | PRN

## 2021-02-02 MED ORDER — LIDOCAINE 2% (20 MG/ML) 5 ML SYRINGE
INTRAMUSCULAR | Status: DC | PRN
  Administered 2021-02-02: 40 mg via INTRAVENOUS

## 2021-02-02 MED ORDER — FENTANYL CITRATE (PF) 250 MCG/5ML IJ SOLN
INTRAMUSCULAR | Status: DC | PRN
  Administered 2021-02-02: 50 ug via INTRAVENOUS

## 2021-02-02 MED ORDER — FENTANYL CITRATE (PF) 100 MCG/2ML IJ SOLN
25.0000 ug | INTRAMUSCULAR | Status: DC | PRN
  Administered 2021-02-02: 50 ug via INTRAVENOUS

## 2021-02-02 MED ORDER — ACETAMINOPHEN 160 MG/5ML PO SOLN: 325.0000 mg | ORAL | Status: DC | PRN

## 2021-02-02 MED ORDER — LIDOCAINE HCL (PF) 1 % IJ SOLN
INTRAMUSCULAR | Status: AC
  Filled 2021-02-02: qty 30

## 2021-02-02 MED ORDER — MIDAZOLAM HCL 2 MG/2ML IJ SOLN
INTRAMUSCULAR | Status: DC | PRN
  Administered 2021-02-02: 2 mg via INTRAVENOUS

## 2021-02-02 MED ORDER — PROPOFOL 10 MG/ML IV BOLUS
INTRAVENOUS | Status: DC | PRN
  Administered 2021-02-02: 200 mg via INTRAVENOUS

## 2021-02-02 MED ORDER — SODIUM CHLORIDE 0.9 % IV SOLN
INTRAVENOUS | Status: AC
  Filled 2021-02-02: qty 1.2

## 2021-02-02 MED ORDER — ACETAMINOPHEN 325 MG PO TABS: 325.0000 mg | ORAL_TABLET | ORAL | Status: DC | PRN

## 2021-02-02 MED ORDER — ONDANSETRON HCL 4 MG/2ML IJ SOLN
INTRAMUSCULAR | Status: AC
  Filled 2021-02-02: qty 2

## 2021-02-02 MED ORDER — CHLORHEXIDINE GLUCONATE CLOTH 2 % EX PADS
6.0000 | MEDICATED_PAD | Freq: Every day | CUTANEOUS | Status: DC
  Administered 2021-02-03 – 2021-02-04 (×2): 6 via TOPICAL

## 2021-02-02 MED ORDER — ORAL CARE MOUTH RINSE: 15.0000 mL | Freq: Once | OROMUCOSAL | Status: AC

## 2021-02-02 MED ORDER — LIDOCAINE 2% (20 MG/ML) 5 ML SYRINGE
INTRAMUSCULAR | Status: AC
  Filled 2021-02-02: qty 5

## 2021-02-02 SURGICAL SUPPLY — 60 items
ARMBAND PINK RESTRICT EXTREMIT (MISCELLANEOUS) ×6 IMPLANT
BAG DECANTER FOR FLEXI CONT (MISCELLANEOUS) ×3 IMPLANT
BIOPATCH RED 1 DISK 7.0 (GAUZE/BANDAGES/DRESSINGS) ×3 IMPLANT
BNDG ELASTIC 4X5.8 VLCR STR LF (GAUZE/BANDAGES/DRESSINGS) ×3 IMPLANT
BNDG ELASTIC 6X5.8 VLCR STR LF (GAUZE/BANDAGES/DRESSINGS) ×3 IMPLANT
BNDG GAUZE ELAST 4 BULKY (GAUZE/BANDAGES/DRESSINGS) ×3 IMPLANT
CANISTER SUCT 3000ML PPV (MISCELLANEOUS) ×3 IMPLANT
CANNULA VESSEL 3MM 2 BLNT TIP (CANNULA) ×3 IMPLANT
CATH PALINDROME-P 19CM W/VT (CATHETERS) ×3 IMPLANT
CATH PALINDROME-P 23CM W/VT (CATHETERS) IMPLANT
CATH PALINDROME-P 28CM W/VT (CATHETERS) IMPLANT
CATH STRAIGHT 5FR 65CM (CATHETERS) IMPLANT
CHLORAPREP W/TINT 26 (MISCELLANEOUS) ×3 IMPLANT
CLIP VESOCCLUDE MED 6/CT (CLIP) ×3 IMPLANT
CLIP VESOCCLUDE SM WIDE 6/CT (CLIP) ×3 IMPLANT
COVER PROBE W GEL 5X96 (DRAPES) IMPLANT
COVER SURGICAL LIGHT HANDLE (MISCELLANEOUS) ×3 IMPLANT
COVER WAND RF STERILE (DRAPES) ×3 IMPLANT
DECANTER SPIKE VIAL GLASS SM (MISCELLANEOUS) ×3 IMPLANT
DERMABOND ADVANCED (GAUZE/BANDAGES/DRESSINGS) ×1
DERMABOND ADVANCED .7 DNX12 (GAUZE/BANDAGES/DRESSINGS) ×2 IMPLANT
DRAIN PENROSE 1/4X12 LTX STRL (WOUND CARE) ×3 IMPLANT
DRAPE C-ARM 42X72 X-RAY (DRAPES) ×3 IMPLANT
DRAPE CHEST BREAST 15X10 FENES (DRAPES) ×3 IMPLANT
ELECT REM PT RETURN 9FT ADLT (ELECTROSURGICAL) ×3
ELECTRODE REM PT RTRN 9FT ADLT (ELECTROSURGICAL) ×2 IMPLANT
GAUZE 4X4 16PLY RFD (DISPOSABLE) ×3 IMPLANT
GAUZE SPONGE 4X4 12PLY STRL (GAUZE/BANDAGES/DRESSINGS) ×3 IMPLANT
GAUZE XEROFORM 5X9 LF (GAUZE/BANDAGES/DRESSINGS) ×3 IMPLANT
GLOVE BIO SURGEON STRL SZ7.5 (GLOVE) ×3 IMPLANT
GLOVE SURG ENC MOIS LTX SZ6.5 (GLOVE) ×9 IMPLANT
GOWN STRL REUS W/ TWL LRG LVL3 (GOWN DISPOSABLE) ×6 IMPLANT
GOWN STRL REUS W/TWL LRG LVL3 (GOWN DISPOSABLE) ×9
KIT BASIN OR (CUSTOM PROCEDURE TRAY) ×3 IMPLANT
KIT PALINDROME-P 55CM (CATHETERS) IMPLANT
KIT TURNOVER KIT B (KITS) ×3 IMPLANT
LOOP VESSEL MINI RED (MISCELLANEOUS) IMPLANT
NEEDLE 18GX1X1/2 (RX/OR ONLY) (NEEDLE) ×6 IMPLANT
NEEDLE HYPO 25GX1X1/2 BEV (NEEDLE) ×3 IMPLANT
NS IRRIG 1000ML POUR BTL (IV SOLUTION) ×3 IMPLANT
PACK CV ACCESS (CUSTOM PROCEDURE TRAY) ×3 IMPLANT
PACK SURGICAL SETUP 50X90 (CUSTOM PROCEDURE TRAY) ×3 IMPLANT
PAD ARMBOARD 7.5X6 YLW CONV (MISCELLANEOUS) ×6 IMPLANT
SET MICROPUNCTURE 5F STIFF (MISCELLANEOUS) IMPLANT
SPONGE SURGIFOAM ABS GEL 100 (HEMOSTASIS) IMPLANT
SUT ETHILON 3 0 PS 1 (SUTURE) ×12 IMPLANT
SUT PROLENE 6 0 CC (SUTURE) ×3 IMPLANT
SUT PROLENE 7 0 BV 1 (SUTURE) IMPLANT
SUT VIC AB 3-0 SH 27 (SUTURE) ×3
SUT VIC AB 3-0 SH 27X BRD (SUTURE) ×2 IMPLANT
SUT VICRYL 4-0 PS2 18IN ABS (SUTURE) ×3 IMPLANT
SYR 10ML LL (SYRINGE) ×6 IMPLANT
SYR 20ML LL LF (SYRINGE) ×6 IMPLANT
SYR 5ML LL (SYRINGE) ×3 IMPLANT
SYR CONTROL 10ML LL (SYRINGE) ×3 IMPLANT
TOWEL GREEN STERILE (TOWEL DISPOSABLE) ×6 IMPLANT
TOWEL GREEN STERILE FF (TOWEL DISPOSABLE) ×3 IMPLANT
UNDERPAD 30X36 HEAVY ABSORB (UNDERPADS AND DIAPERS) ×3 IMPLANT
WATER STERILE IRR 1000ML POUR (IV SOLUTION) ×3 IMPLANT
WIRE AMPLATZ SS-J .035X180CM (WIRE) IMPLANT

## 2021-02-02 NOTE — Anesthesia Procedure Notes (Signed)
Procedure Name: LMA Insertion Date/Time: 02/02/2021 7:55 AM Performed by: Moshe Salisbury, CRNA Pre-anesthesia Checklist: Patient identified, Emergency Drugs available, Suction available and Patient being monitored Patient Re-evaluated:Patient Re-evaluated prior to induction Oxygen Delivery Method: Circle System Utilized Preoxygenation: Pre-oxygenation with 100% oxygen Induction Type: IV induction Ventilation: Mask ventilation without difficulty LMA: LMA inserted LMA Size: 5.0 Number of attempts: 1 Placement Confirmation: positive ETCO2 Tube secured with: Tape Dental Injury: Teeth and Oropharynx as per pre-operative assessment

## 2021-02-02 NOTE — Transfer of Care (Signed)
Immediate Anesthesia Transfer of Care Note  Patient: Thomas Robinson  Procedure(s) Performed: Debride left forearm, ligation of left upper arm Aretiovenous fistula (Left Arm Upper) INSERTION OF Right Internal jugular TUNNELED  DIALYSIS CATHETER (Right Chest)  Patient Location: PACU  Anesthesia Type:General  Level of Consciousness: drowsy and patient cooperative  Airway & Oxygen Therapy: Patient Spontanous Breathing and Patient connected to nasal cannula oxygen  Post-op Assessment: Report given to RN, Post -op Vital signs reviewed and stable and Patient moving all extremities  Post vital signs: Reviewed and stable  Last Vitals:  Vitals Value Taken Time  BP 161/74 02/02/21 0919  Temp    Pulse 66 02/02/21 0922  Resp 11 02/02/21 0922  SpO2 97 % 02/02/21 0922  Vitals shown include unvalidated device data.  Last Pain:  Vitals:   02/02/21 0700  TempSrc: Oral  PainSc: 0-No pain      Patients Stated Pain Goal: 2 (19/01/22 2411)  Complications: No complications documented.

## 2021-02-02 NOTE — Progress Notes (Signed)
Verified with Dr. Jonnie Finner regarding blood transfusion order, patient recent hgb is 8.2; Dr. Jonnie Finner verbalized to discontinue the order.

## 2021-02-02 NOTE — Op Note (Addendum)
Procedure: 1.  Ultrasound right neck, insertion of 19 cm palindrome catheter right internal jugular vein  2.  Incision drainage irrigation debridement left forearm  3.  Ligation left upper arm AV fistula  Preoperative diagnosis: 1.  End-stage renal disease  2.  Venous hypertension left arm  3.  Hematoma left forearm  Postoperative diagnosis: Same  Anesthesia: General  Assistant: Nurse  Operative findings: 1.  19 cm palindrome catheter right internal jugular vein  2.  Hematoma with necrotic skin left forearm  3.  Aerobic culture taken from left forearm  Operative details: After obtaining informed consent, the patient was taken the operating.  The patient was placed in supine position the operating room table.  After induction general anesthesia and placement of a laryngeal mask patient's neck and chest were prepped and draped in usual sterile fashion.  Ultrasound was used to identify the patient's right internal jugular vein.  This had normal compressibility and respiratory variation.  An introducer needle was used to cannulate the right internal jugular vein and 035 J-tip guidewire threaded down in the inferior vena cava under fluoroscopic guidance.  Next sequential 08/15/2015 [place] dilators with the peel-away sheath placed over the guidewire into the right atrium.  Guidewire and dilator were removed.  Next a counterincision was made in the left infraclavicular area and a 19 cm palindrome catheter tunneled through the infraclavicular area up to the neck insertion site.  Tip was then placed to the peel-away sheath done in the right atrium.  Peel-away sheath was removed.  Tip was confirmed to be in the right atrium under fluoroscopy.  Catheter was sutured to the skin with nylon sutures.  Neck insertion site was closed with a Vicryl stitch.  Catheter was thoroughly flushed with heparinized saline.  It was noted to flush and draw easily.  It was loaded with concentrated heparin solution.  Dry  sterile dressing was applied.  Attention was then turned to the patient's left upper extremity.  A pre-existing left antecubital incision was reopened carried down through the subtendinous tissues down the level of the pre-existing AV fistula.  This was patent and had a pulsatile character to it.  The vein was doubly ligated with a 2-0 silk tie.  The wound was then thoroughly irrigated with normal saline solution.  The skin edges were then reapproximated using interrupted 3 oh vertical mattress and simple nylon sutures.  Finally attention was turned to the patient's left forearm.  There was a necrotic area of skin in the distal left forearm.  This was unroofed and there was a hematoma cavity underneath this.  There was approximately 15 cc of blood within the hematoma cavity.  This was all thoroughly evacuated and sent for culture.  The wound was then thoroughly irrigated normal saline solution.  It was then packed with moistened saline kerlix.  Dry sterile dressings were applied.  Xeroform was placed over the antecubital incision.  Patient tolerated procedure well and there were no complications.  The instrument sponge needle counts correct in the case.  The patient was taken the recovery room in stable condition.  Chest x-ray will be obtained in the recovery room.  Ruta Hinds, MD [facility] Office: [phone]

## 2021-02-02 NOTE — Progress Notes (Signed)
Patient refused to go to dialysis. Informed dialysis nurse.

## 2021-02-02 NOTE — Progress Notes (Signed)
Notified Dr. Laithan Conchas Robert of pt's hgb. Stated they will get cbc in operating room. When running the first I stat entered CPB yes, should have been no. Re-ran the test with CPB as no.

## 2021-02-02 NOTE — Progress Notes (Signed)
Patient scheduled for HD today, per primary RN Allyson, patient refused to come for HD. DR. Jonnie Finner notified.

## 2021-02-02 NOTE — Consult Note (Signed)
[facility] Renal Consultation Note    Indication for Consultation:  Management of ESRD/hemodialysis; anemia, hypertension/volume and secondary hyperparathyroidism  HPI: Thomas Robinson is a 60 y.o. male with ESRD on HD, HTN, chronic diastolic HF. He had a left brachiocephalic AV fistula placed and ligation of a left radiocephalic fistula with angioplasty of his left subclavian vein by Dr. Trula Slade on 01/15/21. In the interim he had continued swelling and pain and also had a fall causing a new wound to his LUE. He presented to ED from his dialysis center Friday for evaluation and underwent ligation of his L arm AVF with hematoma evacuation. He also had a new R IJ TDC placed by Dr. Oneida Alar today.   Dialysis at [facility] MWF. Last dialysis was  01/30/21. Missed Friday with ED visit. Labs this am notable for worsening anemia with Hgb 7.5>6.1   Seen in room. Still feels a little "out of it" following surgery. Ate lunch. No specific complaints. Denies cp, sob, n/v. Says things have been going well at his current dialysis center.   Past Medical History:  Diagnosis Date  . Anaphylactic shock, unspecified, sequela 06/10/2019  . Arthritis   . Asthma, chronic, unspecified asthma severity, with acute exacerbation 10/23/2017  . CAP (community acquired pneumonia) 10/23/2017  . Carpal tunnel syndrome of right wrist 10/05/2018  . Cataract    right - removed by surgery  . Cerebral infarction due to thrombosis of cerebral artery ([place])   . Cervical disc herniation 01/04/2020  . Chronic diastolic heart failure ([place]) 04/13/2018  . Chronic low back pain 11/24/2019  . Constipation   . Cough    chronic cough  . Encounter for immunization 07/08/2017  . ESRD on hemodialysis ([place]) 02/16/2018   Dialysis  T Thus Sat  - [facility]   . Fall 06/04/2019  . GERD (gastroesophageal reflux disease) 06/04/2019  . GI bleeding 05/24/2017  . Gram-negative sepsis, unspecified ([place]) 09/05/2017   . History of fusion of cervical spine 03/26/2020  . Hyperlipidemia   . Hypertension   . Hypokalemia 06/04/2017  . Iron deficiency anemia, unspecified 09/09/2017  . Left shoulder pain 11/11/2019  . Lumbar radiculopathy 11/24/2019  . Lung contusion 06/04/2019  . LVH (left ventricular hypertrophy) due to hypertensive disease, with heart failure ([place]) 06/18/2020  . Macrocytic anemia 05/24/2017  . Moderate protein-calorie malnutrition ([place]) 06/06/2017  . Myofascial pain syndrome 06/12/2020  . Neuritis of right ulnar nerve 09/13/2018  . Non-compliance with renal dialysis ([place]) 02/08/2020  . Oxygen deficiency 12/30/2019   O2 sats on RA 87% at PAT appt   . Pain in joint of right elbow 09/13/2018  . Pulmonary hypertension ([place])   . Renovascular hypertension 06/22/2020  . Restless leg syndrome   . Rib fractures 06/2019   Right  . S/P cardiac cath 08/27/2020   normal coronary arteries.  . Secondary hyperparathyroidism of renal origin ([place]) 06/04/2017  . Thoracic ascending aortic aneurysm ([facility])    4.5 cm 06/04/19 CT  . Ulnar neuropathy 10/05/2018  . Volume overload 10/23/2017   Past Surgical History:  Procedure Laterality Date  . A/V FISTULAGRAM N/A 01/01/2021   Procedure: A/V FISTULAGRAM;  Surgeon: Serafina Mitchell, MD;  Location: [place] [facility];  Service: Cardiovascular;  Laterality: N/A;  . A/V FISTULAGRAM N/A 01/15/2021   Procedure: A/V [id number];  Surgeon: Serafina Mitchell, MD;  Location: [place] [facility];  Service: Cardiovascular;  Laterality: N/A;  . ANTERIOR CERVICAL DECOMP/DISCECTOMY FUSION N/A 01/04/2020  Procedure: ANTERIOR CERVICAL DECOMPRESSION/DISCECTOMY FUSION CERVICAL FIVE THROUGH SEVEN;  Surgeon: Melina Schools, MD;  Location: [place];  Service: Orthopedics;  Laterality: N/A;  3 hrs  . AV FISTULA PLACEMENT Left 05/28/2017   Procedure: LEFT ARM ARTERIOVENOUS (AV) FISTULA CREATION;  Surgeon: Conrad Dauberville, MD;  Location: [place];  Service: Vascular;  Laterality: Left;  . EYE SURGERY Right  06/02/2019   Cataract removed  . FRACTURE SURGERY    . IR DIALY SHUNT INTRO NEEDLE/INTRACATH INITIAL W/IMG LEFT Left 09/12/2020  . IR FLUORO GUIDE CV LINE RIGHT  05/25/2017  . IR US GUIDE VASC ACCESS LEFT  09/12/2020  . IR US GUIDE VASC ACCESS RIGHT  05/25/2017  . LIGATION OF ARTERIOVENOUS  FISTULA Left 01/10/2021   Procedure: LIGATION OF LEFT ARM RADIOCEPHALIC FISTULA;  Surgeon: Serafina Mitchell, MD;  Location: [place];  Service: Vascular;  Laterality: Left;  . PERIPHERAL VASCULAR BALLOON ANGIOPLASTY Left 01/15/2021   Procedure: PERIPHERAL VASCULAR BALLOON ANGIOPLASTY;  Surgeon: Serafina Mitchell, MD;  Location: [place] [facility];  Service: Cardiovascular;  Laterality: Left;  AVF  . REVISON OF ARTERIOVENOUS FISTULA Left 07/18/2019   Procedure: REVISION PLICATION OF RADIOCEPHALIC ARTERIOVENOUS FISTULA LEFT ARM;  Surgeon: Angelia Mould, MD;  Location: [place];  Service: Vascular;  Laterality: Left;  . REVISON OF ARTERIOVENOUS FISTULA Left 01/10/2021   Procedure: CONVERSION TO BRACHIOCEPHALIC ARTERIOVENOUS FISTULA;  Surgeon: Serafina Mitchell, MD;  Location: [place];  Service: Vascular;  Laterality: Left;  . RINOPLASTY     Family History  Problem Relation Age of Onset  . Cancer Mother    Social History:  reports that he has never smoked. He has never used smokeless tobacco. He reports previous alcohol use. He reports that he does not use drugs. No Known Allergies Prior to Admission medications   Medication Sig Start Date End Date Taking? Authorizing Provider  acetaminophen (TYLENOL) 500 MG tablet Take 500 mg by mouth every 6 (six) hours as needed for moderate pain or headache.   Yes [provider]  albuterol (VENTOLIN HFA) 108 (90 Base) MCG/ACT inhaler Inhale 2 puffs into the lungs in the morning and at bedtime.   Yes [provider]  amLODipine (NORVASC) 10 MG tablet Take 10 mg by mouth daily.    Yes [provider]  BREO ELLIPTA 100-25 MCG/INH AEPB Inhale 1 puff  into the lungs 2 (two) times daily as needed (sob/wheezing). 06/26/20  Yes [provider]  cholecalciferol (VITAMIN D3) 25 MCG (1000 UNIT) tablet Take 1,000 Units by mouth once a week.   Yes [provider]  DIPHENHYDRAMINE HCL PO Take 25 mg by mouth as needed (allergies). 09/24/20 09/23/21 Yes [provider]  doxercalciferol (HECTOROL) 0.5 MCG capsule Take 0.5 mcg by mouth 3 (three) times a week. 11/26/20 11/25/21 Yes [provider]  furosemide (LASIX) 80 MG tablet Take 80 mg by mouth as directed. Take Daily PRN On Non-Dialysis Days (Sun, Tues, Thurs, Sat)   Yes [provider]  hydrALAZINE (APRESOLINE) 25 MG tablet Take 50 mg by mouth in the morning and at bedtime.   Yes [provider]  metoprolol tartrate (LOPRESSOR) 100 MG tablet Take 0.5 tablets (50 mg total) by mouth 2 (two) times daily. Patient taking differently: Take 100 mg by mouth daily. 01/15/21  Yes Alma Friendly, MD  multivitamin (RENA-VIT) TABS tablet Take 1 tablet by mouth daily. 01/12/18  Yes [provider]  pregabalin (LYRICA) 25 MG capsule Take 25 mg by mouth  daily as needed (pain). 11/29/20  Yes [provider]  sevelamer carbonate (RENVELA) 800 MG tablet Take 800-1,600 mg by mouth daily as needed (with snacks or meals). With Snacks & Meals 11/07/19  Yes [provider]  traMADol (ULTRAM) 50 MG tablet Take 1 tablet (50 mg total) by mouth every 6 (six) hours as needed for moderate pain. 01/25/21  Yes Ulyses Amor, PA-C   Current Facility-Administered Medications  Medication Dose Route Frequency Provider Last Rate Last Admin  . 0.9 %  sodium chloride infusion   Intravenous Continuous Elam Dutch, MD      . acetaminophen (TYLENOL) tablet 650 mg  650 mg Oral Q6H PRN Elam Dutch, MD       Or  . acetaminophen (TYLENOL) suppository 650 mg  650 mg Rectal Q6H PRN Elam Dutch, MD      . albuterol (PROVENTIL) (2.5 MG/3ML) 0.083%  nebulizer solution 2.5 mg  2.5 mg Nebulization Q4H PRN Elam Dutch, MD      . amLODipine (NORVASC) tablet 10 mg  10 mg Oral Daily Elam Dutch, MD   10 mg at 02/02/21 1121  . Chlorhexidine Gluconate Cloth 2 % PADS 6 each  6 each Topical Daily Guilford Shi, MD      . cholecalciferol (VITAMIN D3) tablet 1,000 Units  1,000 Units Oral Weekly Elam Dutch, MD   1,000 Units at 02/02/21 1121  . [START ON 02/04/2021] doxercalciferol (HECTOROL) capsule 0.5 mcg  0.5 mcg Oral Once per day on Mon Wed Fri Fields, Charles E, MD      . fentaNYL (SUBLIMAZE) 100 MCG/2ML injection           . fluticasone furoate-vilanterol (BREO ELLIPTA) 100-25 MCG/INH 1 puff  1 puff Inhalation Daily Fields, Charles E, MD      . furosemide (LASIX) tablet 80 mg  80 mg Oral Once Elam Dutch, MD      . heparin injection 5,000 Units  5,000 Units Subcutaneous Q8H Elam Dutch, MD   5,000 Units at 02/01/21 2201  . hydrALAZINE (APRESOLINE) tablet 50 mg  50 mg Oral BID AC & HS Elam Dutch, MD   50 mg at 02/01/21 2201  . HYDROmorphone (DILAUDID) injection 0.5-1 mg  0.5-1 mg Intravenous Q4H Elam Dutch, MD   1 mg at 02/02/21 1112  . metoprolol tartrate (LOPRESSOR) tablet 50 mg  50 mg Oral BID Elam Dutch, MD   50 mg at 02/02/21 1122  . multivitamin (RENA-VIT) tablet 1 tablet  1 tablet Oral Daily Elam Dutch, MD   1 tablet at 02/02/21 1121  . ondansetron (ZOFRAN) tablet 4 mg  4 mg Oral Q6H PRN Elam Dutch, MD       Or  . ondansetron [facility]) injection 4 mg  4 mg Intravenous Q6H PRN Elam Dutch, MD      . pregabalin (LYRICA) capsule 25 mg  25 mg Oral Daily PRN Elam Dutch, MD   25 mg at 02/01/21 2209  . sevelamer carbonate (RENVELA) tablet 800 mg  800 mg Oral Q lunch Elam Dutch, MD   800 mg at 02/02/21 1121     ROS: As per HPI otherwise negative.  Physical Exam: Vitals:   02/02/21 0935 02/02/21 0950 02/02/21 1005 02/02/21 1022  BP: (!) 153/68 139/75 (!)  160/72 (!) 170/85  Pulse: 60 (!) 52 (!) 52 (!) 54  Resp: 14 12 14 18   Temp:   97.9 F (36.6  C)   TempSrc:      SpO2: 92% 94% 96% 93%  Weight:      Height:         General: Well appearing, nad  HEENT: NCAT sclera not icteric  Neck: Supple. No JVD appreciated  Lungs: CTA bilaterally without wheezes, rales, or rhonchi. Breathing is unlabored. Heart: RRR with S1 S2 Abdomen: soft non-tender  Lower extremities:without edema or ischemic changes, no open wounds  Neuro: A & O  X 3. Moves all extremities spontaneously. Psych:  Responds to questions appropriately with a normal affect. Dialysis Access: R IJ TDC; LUE in ACE wrap   Labs: Basic Metabolic Panel: Recent Labs  Lab 02/01/21 1300 02/02/21 0725 02/02/21 0738  NA 137 139 139  K 4.9 4.2 4.2  CL 93* 102 101  CO2 28  --   --   GLUCOSE 81 66* 66*  BUN 37* 35* 35*  CREATININE 7.45* 7.50* 7.60*  CALCIUM 9.8  --   --    Liver Function Tests: No results for input(s): AST, ALT, ALKPHOS, BILITOT, PROT, ALBUMIN in the last 168 hours. No results for input(s): LIPASE, AMYLASE in the last 168 hours. No results for input(s): AMMONIA in the last 168 hours. CBC: Recent Labs  Lab 02/01/21 1300 02/02/21 0725 02/02/21 0738  WBC 4.7  --   --   NEUTROABS 3.7  --   --   HGB 8.2* 7.5* 6.1*  HCT 26.0* 22.0* 18.0*  MCV 104.8*  --   --   PLT 192  --   --    Cardiac Enzymes: No results for input(s): CKTOTAL, CKMB, CKMBINDEX, TROPONINI in the last 168 hours. CBG: Recent Labs  Lab 02/01/21 1545  GLUCAP 95   Iron Studies: No results for input(s): IRON, TIBC, TRANSFERRIN, FERRITIN in the last 72 hours. Studies/Results: DG CHEST PORT 1 VIEW  Result Date: 02/02/2021 CLINICAL DATA:  S/P hemodialysis catheter insertion EXAM: PORTABLE CHEST - 1 VIEW COMPARISON:  01/13/2021 FINDINGS: Tunneled right IJ hemodialysis catheter extends to the cavoatrial junction. No pneumothorax. Mild cardiomegaly stable.  Aortic Atherosclerosis (ICD10-170.0). Hazy  predominately perihilar interstitial opacities suggesting mild edema. Patchy somewhat coarse poorly marginated airspace opacities in the right apex and left retrocardiac region. Blunting of the left lateral costophrenic angle suggesting small effusion. Cervical fixation hardware partially visualized. IMPRESSION: 1. Central venous catheter to cavoatrial junction without pneumothorax. Electronically Signed   By: Lucrezia Europe M.D.   On: 02/02/2021 09:43   DG Fluoro Guide CV Line-No Report  Result Date: 02/02/2021 Fluoroscopy was utilized by the requesting physician.  No radiographic interpretation.    Dialysis Orders:  AF MWF 4h 400/800 EDW 56.5kg 2K/2Ca  Heparin 2000  Mircera 225 q 2 weeks (last 5/23)  Venofer 50 q wk, Hectorol 2 TIW  Auryxia 2 qac, Sensipar 60  qd   Assessment/Plan: 1. LUE swelling/wound - s/p ligation of L brachiocephalic AVF with evacuation of left forearm hematoma by Dr. Oneida Alar today. Per [facility].  2. ESRD -  HD MWF. Missed Friday. Plan for HD today off schedule. New R IJ TDC placed by Dr. Oneida Alar today.  3. Anemia CKD/ABLA post op - Hgb down to 6.1 on istat. Repeat today and plan transfusion with HD today. Next ESA dose due 6/6.  4. Hypertension/volume  - BP elevated. No edema on exam. UF to dry weight as tolerated.  5. Metabolic bone disease -  Continue binders, Sensipar, Hectorol. Follow Ca/Phos in patient.    Lynnda Child PA-C [facility] 02/02/2021, 2:35 PM

## 2021-02-02 NOTE — Progress Notes (Signed)
Pt refused labs at this time. Pt stated he has been stuck too much and was not going to be stuck now. Importance of labs were explained and pt still refused. Lab will try back to see if pt will let them later.   Eleanora Neighbor, RN

## 2021-02-02 NOTE — Progress Notes (Addendum)
PROGRESS NOTE    Thomas Robinson  FIE:[id number]  DOB: August 13, 1961  PCP: [name], [place] Admit date:02/01/2021 Chief compliant: left arm wound with pain/swelling.  60 y.o. male with HTN, chronic diastolic heart failure (-ve cardiac cath Dec 2021) ,ESRD on HD MWF Monday  who is s/p left forearm radiaocephalic fistula ligation and creation of brachiocephalic fistula by Dr. Trula Slade  on 01/10/21.  He then under went fistulogram with Venoplasty, left subclavian vein.Pt with h/o chronic hypoxic resp failure, supposed to be on 2L home O2 (not really sure how much he uses it though) as he sats low 80s on RA at baseline. Last admitted to this facility 5/14 for acute on chr hypoxic resp failure /CHF and seen by Dr Jonnie Finner for HD.  He has lately been experiencing swelling/pain in left arm and frustarted with persistent swelling inspite of frequent elevation. Recently seen in Vasc surgery clinic on 5/27 and felt he would benefit from ligation the upper arm fistula and placement of a tunneled dialysis catheter for HD. The patient however was reluctant to undergo further surgeries. Hence Vasc Sx placed a moderate compressive ace wrap from the hand to the elbow.  He was instructed to elevate his hand and arm above the level of his heart and to wear the arm slign when [place] to help with elevation.  He presented now from HD with worsening pain, redness and persistent swelling limiting ability to cannulize graft for HD , aborted after few mins.Patient was last dialyzed 2 days PTA and had that session cut short as well ED Course: Afebrile, seen by Vasc sx->  Patient is now consenting to have the fistula ligated and a catheter placed , possibly another long term HD access as OP eventually. Hospital course: Patient admitted to [facility] for above procedure and Observation. Nephrology to be consulted after access is established.Resumed home meds for HTN/asthma. On HD TIW but makes some urine.    Subjective:  Patient s/p  vascular surgery and now has a right IJ TDC.  Surgical dressing/Ace wrap noted along left upper extremity.  In deep sleep when I entered the room, woke up after calling his name couple of times.  Appropriate in conversation and oriented x3.  States his arm feels much better.  Still feels sore in his chest wall and left shoulder-rating pain at 5-6/10.  Noted to be on 2 L O2 via nasal cannula.  Objective: Vitals:   02/02/21 0935 02/02/21 0950 02/02/21 1005 02/02/21 1022  BP: (!) 153/68 139/75 (!) 160/72 (!) 170/85  Pulse: 60 (!) 52 (!) 52 (!) 54  Resp: 14 12 14 18   Temp:   97.9 F (36.6 C)   TempSrc:      SpO2: 92% 94% 96% 93%  Weight:      Height:        Intake/Output Summary (Last 24 hours) at 02/02/2021 1452 Last data filed at 02/02/2021 1000 Gross per 24 hour  Intake 390 ml  Output 25 ml  Net 365 ml   Filed Weights   02/01/21 1239 02/02/21 0700  Weight: 58.2 kg 58.2 kg    Physical Examination:  General: Moderately built, no acute distress noted Head ENT: Atraumatic normocephalic, PERRLA, neck supple Heart: S1-S2 heard, regular rate and rhythm, no murmurs.  No leg edema noted Lungs: Equal air entry bilaterally, no rhonchi or rales on exam, no accessory muscle use. +Rt IJ TDC in place Abdomen: Bowel sounds heard, soft, nontender, nondistended. No organomegaly.  No CVA tenderness Extremities: No  pedal edema. S/p surgery today for Left upper extremity dialysis graft complications. He previously had eschar to left forearm with some surrounding erythema.  Now post surgery and has compression wrap in place for LUE swelling. Fingers still edematous , intact sensations. Neurological: Awake alert oriented x3, no focal weakness or numbness, strength and sensations to crude touch intact Skin: No wounds or rashes.  Data Reviewed: I have personally reviewed following labs and imaging studies  CBC: Recent Labs  Lab 02/01/21 1300 02/02/21 0725 02/02/21 0738  WBC 4.7  --   --   NEUTROABS  3.7  --   --   HGB 8.2* 7.5* 6.1*  HCT 26.0* 22.0* 18.0*  MCV 104.8*  --   --   PLT 192  --   --    Basic Metabolic Panel: Recent Labs  Lab 02/01/21 1300 02/02/21 0725 02/02/21 0738  NA 137 139 139  K 4.9 4.2 4.2  CL 93* 102 101  CO2 28  --   --   GLUCOSE 81 66* 66*  BUN 37* 35* 35*  CREATININE 7.45* 7.50* 7.60*  CALCIUM 9.8  --   --    GFR: Estimated Creatinine Clearance: 8.4 mL/min (A) (by C-G formula based on SCr of 7.6 mg/dL (H)). Liver Function Tests: No results for input(s): AST, ALT, ALKPHOS, BILITOT, PROT, ALBUMIN in the last 168 hours. No results for input(s): LIPASE, AMYLASE in the last 168 hours. No results for input(s): AMMONIA in the last 168 hours. Coagulation Profile: No results for input(s): INR, PROTIME in the last 168 hours. Cardiac Enzymes: No results for input(s): CKTOTAL, CKMB, CKMBINDEX, TROPONINI in the last 168 hours. BNP (last 3 results) No results for input(s): PROBNP in the last 8760 hours. HbA1C: No results for input(s): HGBA1C in the last 72 hours. CBG: Recent Labs  Lab 02/01/21 1545  GLUCAP 95   Lipid Profile: No results for input(s): CHOL, HDL, LDLCALC, TRIG, CHOLHDL, LDLDIRECT in the last 72 hours. Thyroid Function Tests: No results for input(s): TSH, T4TOTAL, FREET4, T3FREE, THYROIDAB in the last 72 hours. Anemia Panel: No results for input(s): VITAMINB12, FOLATE, FERRITIN, TIBC, IRON, RETICCTPCT in the last 72 hours. Sepsis Labs: Recent Labs  Lab 02/01/21 1300  LATICACIDVEN 1.0    Recent Results (from the past 240 hour(s))  Resp Panel by RT-PCR (Flu A&B, Covid) Nasopharyngeal Swab     Status: None   Collection Time: 02/01/21  2:30 PM   Specimen: Nasopharyngeal Swab; Nasopharyngeal(NP) swabs in vial transport medium  Result Value Ref Range Status   SARS Coronavirus 2 by RT PCR NEGATIVE NEGATIVE Final    Comment: (NOTE) SARS-CoV-2 target nucleic acids are NOT DETECTED.  The SARS-CoV-2 RNA is generally detectable in upper  respiratory specimens during the acute phase of infection. The lowest concentration of SARS-CoV-2 viral copies this assay can detect is 138 copies/mL. A negative result does not preclude SARS-Cov-2 infection and should not be used as the sole basis for treatment or other patient management decisions. A negative result may occur with  improper specimen collection/handling, submission of specimen other than nasopharyngeal swab, presence of viral mutation(s) within the areas targeted by this assay, and inadequate number of viral copies(<138 copies/mL). A negative result must be combined with clinical observations, patient history, and epidemiological information. The expected result is Negative.  Fact Sheet for Patients:  EntrepreneurPulse.com.au  Fact Sheet for Healthcare Providers:  IncredibleEmployment.be  This test is no t yet approved or cleared by the [place] FDA and  has  been authorized for detection and/or diagnosis of SARS-CoV-2 by FDA under an Emergency Use Authorization (EUA). This EUA will remain  in effect (meaning this test can be used) for the duration of the COVID-19 declaration under Section 564(b)(1) of the Act, 21 U.S.C.section 360bbb-3(b)(1), unless the authorization is terminated  or revoked sooner.       Influenza A by PCR NEGATIVE NEGATIVE Final   Influenza B by PCR NEGATIVE NEGATIVE Final    Comment: (NOTE) The Xpert Xpress SARS-CoV-2/FLU/RSV plus assay is intended as an aid in the diagnosis of influenza from Nasopharyngeal swab specimens and should not be used as a sole basis for treatment. Nasal washings and aspirates are unacceptable for Xpert Xpress SARS-CoV-2/FLU/RSV testing.  Fact Sheet for Patients: EntrepreneurPulse.com.au  Fact Sheet for Healthcare Providers: IncredibleEmployment.be  This test is not yet approved or cleared by the [place] FDA and has been  authorized for detection and/or diagnosis of SARS-CoV-2 by FDA under an Emergency Use Authorization (EUA). This EUA will remain in effect (meaning this test can be used) for the duration of the COVID-19 declaration under Section 564(b)(1) of the Act, 21 U.S.C. section 360bbb-3(b)(1), unless the authorization is terminated or revoked.  Performed at [facility], [place] [address] [place], [place] [address]   Surgical pcr screen     Status: None   Collection Time: 02/02/21 12:27 AM   Specimen: Nasal Mucosa; Nasal Swab  Result Value Ref Range Status   MRSA, PCR NEGATIVE NEGATIVE Final   Staphylococcus aureus NEGATIVE NEGATIVE Final    Comment: (NOTE) The Xpert SA Assay (FDA approved for NASAL specimens in patients 42 years of age and older), is one component of a comprehensive surveillance program. It is not intended to diagnose infection nor to guide or monitor treatment. Performed at [facility], [place] [address] [place], [place] [address]       Radiology Studies: DG CHEST PORT 1 VIEW  Result Date: 02/02/2021 CLINICAL DATA:  S/P hemodialysis catheter insertion EXAM: PORTABLE CHEST - 1 VIEW COMPARISON:  01/13/2021 FINDINGS: Tunneled right IJ hemodialysis catheter extends to the cavoatrial junction. No pneumothorax. Mild cardiomegaly stable.  Aortic Atherosclerosis (ICD10-170.0). Hazy predominately perihilar interstitial opacities suggesting mild edema. Patchy somewhat coarse poorly marginated airspace opacities in the right apex and left retrocardiac region. Blunting of the left lateral costophrenic angle suggesting small effusion. Cervical fixation hardware partially visualized. IMPRESSION: 1. Central venous catheter to cavoatrial junction without pneumothorax. Electronically Signed   By: Lucrezia Europe M.D.   On: 02/02/2021 09:43   DG Fluoro Guide CV Line-No Report  Result Date: 02/02/2021 Fluoroscopy was utilized by the requesting physician.  No radiographic interpretation.       Scheduled Meds: . amLODipine  10 mg Oral Daily  . Chlorhexidine Gluconate Cloth  6 each Topical Daily  . cholecalciferol  1,000 Units Oral Weekly  . [START ON 02/04/2021] doxercalciferol  0.5 mcg Oral Once per day on Mon Wed Fri  . fentaNYL      . fluticasone furoate-vilanterol  1 puff Inhalation Daily  . furosemide  80 mg Oral Once  . heparin  5,000 Units Subcutaneous Q8H  . hydrALAZINE  50 mg Oral BID AC & HS  .  HYDROmorphone (DILAUDID) injection  0.5-1 mg Intravenous Q4H  . metoprolol tartrate  50 mg Oral BID  . multivitamin  1 tablet Oral Daily  . sevelamer carbonate  800 mg Oral Q lunch   Continuous Infusions: . sodium chloride  Assessment/Plan:  1. AV fistula complications with Venous HTN [name] /eschar of left forearm: Underwent  Ligation left arm AV fistula, debridement left forearm and placement of tunneled dialysis catheter to rt IJ today .  Compression wrap by vascular surgery.  No signs of compartment syndrome, patient feels less heavy in this arm.  He is however worried about TDC staying in too long as he apparently had a line infection in the past. Will follow vascular surgery recommendations regarding postop care.  Has Dilaudid as needed for pain-discussed with bedside nurse to watch for sedation-can likely be transitioned to p.o. meds 24 hours postop.  Follow-up cultures sent from the OR.  2.  ESRD on HD: As mentioned above, patient could not have last dialysis session completed.  Called and discussed with Dr. Jonnie Finner who will evaluate patient for possible dialysis in a.m. versus Monday.  Lasix p.o. x1 noted.  3. Acute hypoxic respiratory failure, history of asthma: Patient was saturating 89% on arrival and placed on 2 L nasal cannula.  No wheezing currently.  Chest x-ray on admission did show mild pulmonary edema/small effusion.  Denies any significant dyspnea but likely hypoxic due to volume overload in the setting of missed dialysis.  Continue home inhalers,  denies being on home O2. Taper to off as tolerated after HD.  4. Hypertension: Resumed home medications. BP somewhat elevated intermittently likely due to pain   5. Acute on chronic anemia : Pre op Hgb this morning had 2 readings 7.5 and 6.1 ( baseline 8.2) . Will repeat and transfuse if <7 with post transfusion IV lasix. Could have had an acute component due to hematoma.  DVT prophylaxis: Heparin when okay per vascular Code Status: Full code Family / Patient Communication: D/W patient Disposition Plan:   Status is: Observation  The patient will require care spanning > 2 midnights and should be moved to inpatient because: Ongoing active pain requiring inpatient pain management and Inpatient level of care appropriate due to severity of illness, hypoxic respiratory failure requiring new O2.Anemia  Dispo: The patient is from: Home              Anticipated d/c is to: Home when cleared by vascular/renal              Patient currently is not medically stable to d/c.   Difficult to place patient No        Time spent: 35 mins   >50% time spent in discussions with care team and coordination of care.    Guilford Shi, MD [facility] Pager in [place]  If 7PM-7AM, please contact night-coverage [email or web address] 02/02/2021, 2:52 PM

## 2021-02-02 NOTE — Progress Notes (Signed)
Pt being transferred to OR at this time. Report has been given and all questions answered.  Eleanora Neighbor, RN

## 2021-02-02 NOTE — Progress Notes (Signed)
Patient will be transferred to [facility]. They will take over as primary @ 7am this morning.

## 2021-02-02 NOTE — Anesthesia Postprocedure Evaluation (Signed)
Anesthesia Post Note  Patient: Arvle Grabe  Procedure(s) Performed: Debride left forearm, ligation of left upper arm Aretiovenous fistula (Left Arm Upper) INSERTION OF Right Internal jugular TUNNELED  DIALYSIS CATHETER (Right Chest)     Patient location during evaluation: PACU Anesthesia Type: General Level of consciousness: awake and alert Pain management: pain level controlled Vital Signs Assessment: post-procedure vital signs reviewed and stable Respiratory status: spontaneous breathing, nonlabored ventilation, respiratory function stable and patient connected to nasal cannula oxygen Cardiovascular status: blood pressure returned to baseline and stable Postop Assessment: no apparent nausea or vomiting Anesthetic complications: no   No complications documented.  Last Vitals:  Vitals:   02/02/21 1005 02/02/21 1022  BP: (!) 160/72 (!) 170/85  Pulse: (!) 52 (!) 54  Resp: 14 18  Temp: 36.6 C   SpO2: 96% 93%    Last Pain:  Vitals:   02/02/21 1005  TempSrc:   PainSc: 2                  Effie Berkshire

## 2021-02-02 NOTE — Plan of Care (Signed)
  Problem: Nutrition: Goal: Adequate nutrition will be maintained Outcome: Progressing   Problem: Coping: Goal: Level of anxiety will decrease Outcome: Progressing   Problem: Pain Managment: Goal: General experience of comfort will improve Outcome: Progressing   

## 2021-02-02 NOTE — Interval H&P Note (Signed)
History and Physical Interval Note:  02/02/2021 7:37 AM  Thomas Robinson  has presented today for surgery, with the diagnosis of Cellulitis.  The various methods of treatment have been discussed with the patient and family. After consideration of risks, benefits and other options for treatment, the patient has consented to  Procedure(s): ARTERIOVENOUS (AV) FISTULA CREATION AND LIGATION UPPER ARM (Left) INSERTION OF TUNNELED  DIALYSIS CATHETER (Left) as a surgical intervention.  The patient's history has been reviewed, patient examined, no change in status, stable for surgery.  I have reviewed the patient's chart and labs.  Questions were answered to the patient's satisfaction.     Ruta Hinds

## 2021-02-03 ENCOUNTER — Encounter (HOSPITAL_COMMUNITY): Payer: Self-pay | Admitting: Vascular Surgery

## 2021-02-03 DIAGNOSIS — L0390 Cellulitis, unspecified: Secondary | ICD-10-CM

## 2021-02-03 LAB — CBC
HCT: 26 % — ABNORMAL LOW (ref 39.0–52.0)
Hemoglobin: 8.3 g/dL — ABNORMAL LOW (ref 13.0–17.0)
MCH: 33.1 pg (ref 26.0–34.0)
MCHC: 31.9 g/dL (ref 30.0–36.0)
MCV: 103.6 fL — ABNORMAL HIGH (ref 80.0–100.0)
Platelets: 202 10*3/uL (ref 150–400)
RBC: 2.51 MIL/uL — ABNORMAL LOW (ref 4.22–5.81)
RDW: 14.8 % (ref 11.5–15.5)
WBC: 6.5 10*3/uL (ref 4.0–10.5)
nRBC: 0 % (ref 0.0–0.2)

## 2021-02-03 LAB — BASIC METABOLIC PANEL
Anion gap: 17 — ABNORMAL HIGH (ref 5–15)
BUN: 53 mg/dL — ABNORMAL HIGH (ref 8–23)
CO2: 28 mmol/L (ref 22–32)
Calcium: 10 mg/dL (ref 8.9–10.3)
Chloride: 91 mmol/L — ABNORMAL LOW (ref 98–111)
Creatinine, Ser: 9.83 mg/dL — ABNORMAL HIGH (ref 0.61–1.24)
GFR, Estimated: 6 mL/min — ABNORMAL LOW (ref >60)
Glucose, Bld: 126 mg/dL — ABNORMAL HIGH (ref 70–99)
Potassium: 4.9 mmol/L (ref 3.5–5.1)
Sodium: 136 mmol/L (ref 135–145)

## 2021-02-03 MED ORDER — ORAL CARE MOUTH RINSE
15.0000 mL | Freq: Two times a day (BID) | OROMUCOSAL | Status: DC
  Administered 2021-02-03: 15 mL via OROMUCOSAL

## 2021-02-03 MED ORDER — SEVELAMER CARBONATE 800 MG PO TABS
1600.0000 mg | ORAL_TABLET | Freq: Every day | ORAL | Status: DC
Start: 2021-02-03 — End: 2021-02-04
  Administered 2021-02-03: 1600 mg via ORAL
  Filled 2021-02-03: qty 2

## 2021-02-03 MED ORDER — HEPARIN SODIUM (PORCINE) 1000 UNIT/ML IJ SOLN
INTRAMUSCULAR | Status: AC
  Filled 2021-02-03: qty 4

## 2021-02-03 MED ORDER — DARBEPOETIN ALFA 200 MCG/0.4ML IJ SOSY
200.0000 ug | PREFILLED_SYRINGE | INTRAMUSCULAR | Status: DC
  Filled 2021-02-03: qty 0.4

## 2021-02-03 MED ORDER — CINACALCET HCL 30 MG PO TABS
60.0000 mg | ORAL_TABLET | Freq: Every day | ORAL | Status: DC
  Administered 2021-02-03: 60 mg via ORAL
  Filled 2021-02-03: qty 2

## 2021-02-03 MED ORDER — HEPARIN SODIUM (PORCINE) 1000 UNIT/ML IJ SOLN
3200.0000 [IU] | Freq: Once | INTRAMUSCULAR | Status: AC
  Administered 2021-02-03: 3200 [IU]

## 2021-02-03 NOTE — Progress Notes (Signed)
[facility] Progress Note   Subjective:  Seen in room. Some arm pain but says edema improved.  Didn't want to go to dialysis last night --will have dialysis today.   Objective Vitals:   02/02/21 1005 02/02/21 1022 02/02/21 2110 02/03/21 0455  BP: (!) 160/72 (!) 170/85 127/70 (!) 120/56  Pulse: (!) 52 (!) 54 70 (!) 51  Resp: 14 18 18 18   Temp: 97.9 F (36.6 C)  98.4 F (36.9 C) 98.1 F (36.7 C)  TempSrc:   Oral Oral  SpO2: 96% 93% 95% 97%  Weight:      Height:         Additional Objective Labs: Basic Metabolic Panel: Recent Labs  Lab 02/01/21 1300 02/02/21 0725 02/02/21 0738 02/02/21 1612 02/03/21 0838  NA 137   < > 139 138 136  K 4.9   < > 4.2 5.5* 4.9  CL 93*   < > 101 94* 91*  CO2 28  --   --  28 28  GLUCOSE 81   < > 66* 139* 126*  BUN 37*   < > 35* 46* 53*  CREATININE 7.45*   < > 7.60* 9.12* 9.83*  CALCIUM 9.8  --   --  9.6 10.0  PHOS  --   --   --  8.9*  --    < > = values in this interval not displayed.   CBC: Recent Labs  Lab 02/01/21 1300 02/02/21 0725 02/02/21 0738 02/02/21 1612 02/03/21 0838  WBC 4.7  --   --  4.0 6.5  NEUTROABS 3.7  --   --   --   --   HGB 8.2*   < > 6.1* 8.1*  8.2* 8.3*  HCT 26.0*   < > 18.0* 25.4*  25.5* 26.0*  MCV 104.8*  --   --  103.2* 103.6*  PLT 192  --   --  172 202   < > = values in this interval not displayed.   Blood Culture    Component Value Date/Time   SDES WOUND 02/02/2021 0858   SPECREQUEST LEFT FOREARM WD SPEC A 02/02/2021 0858   CULT PENDING 02/02/2021 0858   REPTSTATUS PENDING 02/02/2021 0858     Physical Exam General: Well appearing man, nad  Heart: RRR Lungs: Clear bilaterally  Abdomen: soft non-tender  Extremities: No LE edema; entire LUE in Ace wrap  Dialysis Access: R IJ TDC  Medications: . sodium chloride     . sodium chloride   Intravenous Once  . amLODipine  10 mg Oral Daily  . Chlorhexidine Gluconate Cloth  6 each Topical Daily  . Chlorhexidine Gluconate Cloth   6 each Topical Q0600  . cholecalciferol  1,000 Units Oral Weekly  . [START ON 02/04/2021] doxercalciferol  0.5 mcg Oral Once per day on Mon Wed Fri  . fluticasone furoate-vilanterol  1 puff Inhalation Daily  . heparin  5,000 Units Subcutaneous Q8H  . hydrALAZINE  50 mg Oral BID AC & HS  .  HYDROmorphone (DILAUDID) injection  0.5-1 mg Intravenous Q4H  . mouth rinse  15 mL Mouth Rinse BID  . metoprolol tartrate  50 mg Oral BID  . multivitamin  1 tablet Oral Daily  . sevelamer carbonate  800 mg Oral Q lunch    Dialysis Orders:  AF MWF 4h 400/800 EDW 56.5kg 2K/2Ca  Heparin 2000  Mircera 225 q 2 weeks (last 5/23)  Venofer 50 q wk, Hectorol 2 TIW  Auryxia 2 qac, Sensipar 60  qd   Assessment/Plan: 1. LUE swelling/wound - s/p ligation of L brachiocephalic AVF with evacuation of left forearm hematoma by Dr. Oneida Alar 6/4 Per [facility].  2. ESRD -  HD MWF. Missed Friday. Refused Saturday. Plan for HD today off schedule. Back on schedule tomorrow if still here.  New R IJ TDC placed by Dr. Oneida Alar 6/4.  3. Anemia CKD/ABLA post op - Hgb 8.1.  Next ESA dose due 6/6.  4. Hypertension/volume  - BP controlled this am. No edema on exam. UF to dry weight as tolerated.  5. Metabolic bone disease -  Ca slightly elevated. Hyperphosphatemia. Continue binder, Sensipar for now.    Lynnda Child PA-C [facility] 02/03/2021,10:06 AM

## 2021-02-03 NOTE — Progress Notes (Signed)
[facility]  Subjective  - arm hurts but better than yesterday   Objective (!) 120/56 (!) 51 98.1 F (36.7 C) (Oral) 18 97%  Intake/Output Summary (Last 24 hours) at 02/03/2021 8115 Last data filed at 02/03/2021 0600 Gross per 24 hour  Intake 1010 ml  Output 1 ml  Net 1009 ml   Antecubital incision intact Left forearm wound clean still some  Assessment/Planning: I and D left arm ligation fistula arm edema improved wound left forearm clean Will need home health wound care NS wet to dry BID  Will arrange follow up with [place] in a few weeks  Call if questions  Ruta Hinds 02/03/2021 9:17 AM --  Laboratory Lab Results: Recent Labs    02/02/21 1612 02/03/21 0838  WBC 4.0 6.5  HGB 8.1*  8.2* 8.3*  HCT 25.4*  25.5* 26.0*  PLT 172 202   BMET Recent Labs    02/01/21 1300 02/02/21 0725 02/02/21 0738 02/02/21 1612  NA 137   < > 139 138  K 4.9   < > 4.2 5.5*  CL 93*   < > 101 94*  CO2 28  --   --  28  GLUCOSE 81   < > 66* 139*  BUN 37*   < > 35* 46*  CREATININE 7.45*   < > 7.60* 9.12*  CALCIUM 9.8  --   --  9.6   < > = values in this interval not displayed.    COAG Lab Results  Component Value Date   INR 1.2 01/12/2021   INR 1.1 01/04/2020   INR 1.14 07/06/2018   No results found for: PTT

## 2021-02-03 NOTE — Evaluation (Signed)
Physical Therapy Evaluation Patient Details Name: Thomas Robinson MRN: [id number] DOB: 04/18/1961 Today's Date: 02/03/2021   History of Present Illness  60 y.o. male presents to [facility] ED on 02/01/2021 after failed dialysis session due to LUE wound. Pt had undergone recent brachiocephalic AV fistula placement and ligation of left radiocephalic fistula with angioplasty of his left subclavian vein on Jan 15 2021. This admission, pt underwent AV fistula ligation, I&D of L forearm and catheter placement on 02/02/2021. PMH includes HTN, chronic diastolic heart failure , and end-stage renal disease.  Clinical Impression  Pt presents to PT with deficits in gait, balance, strength, power, endurance. Pt with significant instability noted without UE support of a railing or device when ambulating. Balance deviations are mitigated with use of RW. PT recommends use of RW for all out of bed mobility at this time to improve balance and reduce falls risk, although the pt does not seem to agree with this recommendation. PT also provides recommendation for home health PT however the pt reports a preference for returning to the gym rather than having skilled PT services. Acute PT will follow during admission to continue aggressively mobilizing and attempting to reduce falls risk.    Follow Up Recommendations Home health PT;Supervision for mobility/OOB    Equipment Recommendations  Rolling walker with 5" wheels (pt likely to refuse device)    Recommendations for Other Services       Precautions / Restrictions Precautions Precautions: Fall Restrictions Weight Bearing Restrictions: No      Mobility  Bed Mobility Overal bed mobility: Modified Independent             General bed mobility comments: increased time    Transfers Overall transfer level: Needs assistance Equipment used: None Transfers: Sit to/from Stand Sit to Stand: Min guard            Ambulation/Gait Ambulation/Gait assistance: Min  guard Gait Distance (Feet): 200 Feet (20' without device, 58' with railing, 55' with RW) Assistive device: Rolling walker (2 wheeled);None Gait Pattern/deviations: Step-through pattern;Staggering left;Staggering right Gait velocity: reduced Gait velocity interpretation: 1.31 - 2.62 ft/sec, indicative of limited community ambulator General Gait Details: pt with slowed step-through gait, multiple losses of balance to R side needing physical assist to correct when ambulating without UE support. Balance deficits mitigated with use of RW, including one instance of mild L knee buckle  Stairs            Wheelchair Mobility    Modified Rankin (Stroke Patients Only)       Balance Overall balance assessment: Needs assistance Sitting-balance support: No upper extremity supported;Feet supported Sitting balance-Leahy Scale: Good     Standing balance support: Single extremity supported;Bilateral upper extremity supported Standing balance-Leahy Scale: Poor Standing balance comment: reliant on UE support or physical assistance                             Pertinent Vitals/Pain Pain Assessment: No/denies pain    Home Living Family/patient expects to be discharged to:: Private residence Living Arrangements: Other relatives (aunt) Available Help at Discharge: Family;Available 24 hours/day Type of Home: House Home Access: Stairs to enter Entrance Stairs-Rails: None Entrance Stairs-Number of Steps: 2 Home Layout: One level Home Equipment: Walker - 4 wheels;Cane - single point      Prior Function Level of Independence: Independent         Comments: pt with recent fall     Hand Dominance  Dominant Hand: Right    Extremity/Trunk Assessment   Upper Extremity Assessment Upper Extremity Assessment: LUE deficits/detail LUE Deficits / Details: ace wrapped, hand edema noted reducing grip ROM    Lower Extremity Assessment Lower Extremity Assessment: Overall WFL for  tasks assessed    Cervical / Trunk Assessment Cervical / Trunk Assessment: Normal  Communication   Communication: No difficulties  Cognition Arousal/Alertness: Awake/alert Behavior During Therapy: WFL for tasks assessed/performed Overall Cognitive Status: Within Functional Limits for tasks assessed                                        General Comments General comments (skin integrity, edema, etc.): VSS on RA    Exercises     Assessment/Plan    PT Assessment Patient needs continued PT services  PT Problem List Decreased strength;Decreased activity tolerance;Decreased balance;Decreased mobility;Decreased knowledge of use of DME;Decreased safety awareness       PT Treatment Interventions DME instruction;Gait training;Stair training;Functional mobility training;Therapeutic activities;Balance training;Neuromuscular re-education;Patient/family education    PT Goals (Current goals can be found in the Care Plan section)  Acute Rehab PT Goals Patient Stated Goal: to go home PT Goal Formulation: With patient Time For Goal Achievement: 02/17/21 Potential to Achieve Goals: Fair    Frequency Min 3X/week   Barriers to discharge        Co-evaluation               AM-PAC PT "6 Clicks" Mobility  Outcome Measure Help needed turning from your back to your side while in a flat bed without using bedrails?: None Help needed moving from lying on your back to sitting on the side of a flat bed without using bedrails?: None Help needed moving to and from a bed to a chair (including a wheelchair)?: A Little Help needed standing up from a chair using your arms (e.g., wheelchair or bedside chair)?: A Little Help needed to walk in hospital room?: A Little Help needed climbing 3-5 steps with a railing? : A Lot 6 Click Score: 19    End of Session   Activity Tolerance: Patient tolerated treatment well Patient left: in bed;with call bell/phone within reach;with bed alarm  set Nurse Communication: Mobility status PT Visit Diagnosis: Unsteadiness on feet (R26.81);History of falling (Z91.81)    Time: 1630-1650 PT Time Calculation (min) (ACUTE ONLY): 20 min   Charges:   PT Evaluation $PT Eval Low Complexity: Jerauld, PT, DPT Acute Rehabilitation Pager: [phone]   Zenaida Niece 02/03/2021, 5:03 PM

## 2021-02-03 NOTE — Progress Notes (Signed)
PROGRESS NOTE                                                                                                                                                                                                             Patient Demographics:    Thomas Robinson, is a 60 y.o. male, DOB - September 06, 1960, HFW:[id number]  Outpatient Primary MD for the patient is [name], [place]    LOS - 1  Admit date - 02/01/2021    Chief Complaint  Patient presents with  . wound infection       Brief Narrative (HPI from H&P)  - 60 y.o.malewith HTN,chronic diastolic heart failure (-ve cardiac cath Dec 2021),ESRD on HD MWF Monday  who is s/p left forearm radiaocephalic fistula ligationand creation of brachiocephalic fistula by Dr. Samuel Bouche 01/10/21. He then under went fistulogram with Venoplasty, left subclavian vein.Pt with h/o chronic hypoxic resp failure, supposed to be on 2L home O2, presented to the ER with left arm pain and swelling he was thought to have developed a hematoma in his left AV fistula site was seen by vascular surgery and was taken to the OR for ligation of his left arm AV fistula, admitted for further care.   Subjective:    Thomas Robinson 60 today has, No headache, No chest pain, No abdominal pain - No Nausea, No new weakness tingling or numbness, no SOB, mild L arm pain.   Assessment  & Plan :    1. AV fistula complications with Venous HTN [name] /eschar of left forearm: Underwent  Ligation left arm AV fistula, debridement left forearm and placement of tunneled right IJ dialysis catheter on 02/02/2021 by vascular surgery, currently has compression wrap and arm is better, less pain, continue supportive care, vascular surgery following, likely discharge home in 1 to 2 days once cleared by vascular surgery.  2.  ESRD on HD:  Nephrology following undergoing HD via right IJ tunneled catheter.  3. Acute hypoxic respiratory failure,  history of asthma: Patient was saturating 89% on arrival and placed on 2 L nasal cannula.  This likely was due to fluid overload from ESRD, improved after HD.  Now symptom-free.  4. Hypertension: Resumed home medications. BP somewhat elevated intermittently likely due to pain   5. Acute on chronic anemia : Due to ESRD, no transfusions this admission no signs of  active bleeding, will continue to monitor.          Condition -  Guarded  Family Communication  :  None present  Code Status :  Full  Consults  :  VS, Renal  PUD Prophylaxis : None   Procedures  :     On 02/02/2021 by Dr. Oneida Alar vascular surgery he underwent Underwent  Ligation left arm AV fistula, debridement left forearm and placement of R. IJ HD Cath.      Disposition Plan  :    Status is: Inpatient  Remains inpatient appropriate because:IV treatments appropriate due to intensity of illness or inability to take PO   Dispo: The patient is from: Home              Anticipated d/c is to: Home              Patient currently is not medically stable to d/c.   Difficult to place patient No  DVT Prophylaxis  :    heparin injection 5,000 Units Start: 02/01/21 1530   Lab Results  Component Value Date   PLT 202 02/03/2021    Diet :  Diet Order            Diet renal/carb modified with fluid restriction Diet-HS Snack? Nothing; Fluid restriction: 1200 mL Fluid; Room service appropriate? Yes; Fluid consistency: Thin  Diet effective now                  Inpatient Medications  Scheduled Meds: . sodium chloride   Intravenous Once  . amLODipine  10 mg Oral Daily  . Chlorhexidine Gluconate Cloth  6 each Topical Daily  . Chlorhexidine Gluconate Cloth  6 each Topical Q0600  . cholecalciferol  1,000 Units Oral Weekly  . cinacalcet  60 mg Oral Q supper  . [START ON 02/04/2021] darbepoetin (ARANESP) injection - DIALYSIS  200 mcg Intravenous Q Mon-HD  . fluticasone furoate-vilanterol  1 puff Inhalation Daily  .  heparin  5,000 Units Subcutaneous Q8H  . heparin sodium (porcine)  3,200 Units Intracatheter Once  . hydrALAZINE  50 mg Oral BID AC & HS  .  HYDROmorphone (DILAUDID) injection  0.5-1 mg Intravenous Q4H  . mouth rinse  15 mL Mouth Rinse BID  . metoprolol tartrate  50 mg Oral BID  . multivitamin  1 tablet Oral Daily  . sevelamer carbonate  1,600 mg Oral Q lunch   Continuous Infusions: . sodium chloride     PRN Meds:.acetaminophen **OR** acetaminophen, albuterol, ondansetron **OR** ondansetron (ZOFRAN) IV, pregabalin  Antibiotics  :    Anti-infectives (From admission, onward)   Start     Dose/Rate Route Frequency Ordered Stop   02/02/21 0000  ceFAZolin (ANCEF) IVPB 1 g/50 mL premix       Note to Pharmacy: Send with pt to OR   1 g 100 mL/hr over 30 Minutes Intravenous On call to O.R. 02/01/21 1532 02/02/21 0559   02/01/21 1415  vancomycin (VANCOREADY) IVPB 1250 mg/250 mL  Status:  Discontinued        1,250 mg 166.7 mL/hr over 90 Minutes Intravenous  Once 02/01/21 1412 02/01/21 1524       Time Spent in minutes  30   Lala Lund M.D on 02/03/2021 at 11:52 AM  To page go to [email or web address]   [facility] -  Office  [phone]   See all Orders from today for further details    Objective:   Vitals:   02/03/21 1000 02/03/21 1009  02/03/21 1030 02/03/21 1100  BP: (!) 120/56 (!) 130/50 (!) 130/47 (!) 118/40  Pulse: (!) 56 60 60 62  Resp: 18 18 18 18   Temp: 98.5 F (36.9 C)     TempSrc: Oral     SpO2: 97%     Weight: 59 kg     Height:        Wt Readings from Last 3 Encounters:  02/03/21 59 kg  01/25/21 58.2 kg  01/12/21 60.6 kg     Intake/Output Summary (Last 24 hours) at 02/03/2021 1152 Last data filed at 02/03/2021 0900 Gross per 24 hour  Intake 1440 ml  Output 1 ml  Net 1439 ml     Physical Exam  Awake Alert, No new F.N deficits, Normal affect [place].AT,PERRAL Supple Neck,No JVD, No cervical lymphadenopathy appriciated.  Symmetrical Chest wall movement,  Good air movement bilaterally, CTAB RRR,No Gallops,Rubs or new Murmurs, No Parasternal Heave +ve B.Sounds, Abd Soft, No tenderness, No organomegaly appriciated, No rebound - guarding or rigidity. No Cyanosis, right IJ dialysis catheter in place, left arm under compression wrap.    Data Review:    CBC Recent Labs  Lab 02/01/21 1300 02/02/21 0725 02/02/21 0738 02/02/21 1612 02/03/21 0838  WBC 4.7  --   --  4.0 6.5  HGB 8.2* 7.5* 6.1* 8.1*  8.2* 8.3*  HCT 26.0* 22.0* 18.0* 25.4*  25.5* 26.0*  PLT 192  --   --  172 202  MCV 104.8*  --   --  103.2* 103.6*  MCH 33.1  --   --  33.2 33.1  MCHC 31.5  --   --  32.2 31.9  RDW 14.8  --   --  14.7 14.8  LYMPHSABS 0.4*  --   --   --   --   MONOABS 0.3  --   --   --   --   EOSABS 0.2  --   --   --   --   BASOSABS 0.1  --   --   --   --     Recent Labs  Lab 02/01/21 1300 02/02/21 0725 02/02/21 0738 02/02/21 1612 02/03/21 0838  NA 137 139 139 138 136  K 4.9 4.2 4.2 5.5* 4.9  CL 93* 102 101 94* 91*  CO2 28  --   --  28 28  GLUCOSE 81 66* 66* 139* 126*  BUN 37* 35* 35* 46* 53*  CREATININE 7.45* 7.50* 7.60* 9.12* 9.83*  CALCIUM 9.8  --   --  9.6 10.0  ALBUMIN  --   --   --  2.8*  --   MG  --   --   --  2.7*  --   LATICACIDVEN 1.0  --   --   --   --     ------------------------------------------------------------------------------------------------------------------ No results for input(s): CHOL, HDL, LDLCALC, TRIG, CHOLHDL, LDLDIRECT in the last 72 hours.  Lab Results  Component Value Date   HGBA1C 4.9 01/04/2020   ------------------------------------------------------------------------------------------------------------------ No results for input(s): TSH, T4TOTAL, T3FREE, THYROIDAB in the last 72 hours.  Invalid input(s): FREET3  Cardiac Enzymes No results for input(s): CKMB, TROPONINI, MYOGLOBIN in the last 168 hours.  Invalid input(s):  CK ------------------------------------------------------------------------------------------------------------------    Component Value Date/Time   BNP 4,033.1 (H) 09/18/2020 3267    Micro Results Recent Results (from the past 240 hour(s))  Culture, blood (Routine X 2) w Reflex to ID Panel     Status: None (Preliminary result)   Collection Time: 02/01/21  1:00 PM   Specimen: BLOOD  Result Value Ref Range Status   Specimen Description BLOOD RIGHT ANTECUBITAL  Final   Special Requests   Final    BOTTLES DRAWN AEROBIC AND ANAEROBIC Blood Culture results may not be optimal due to an inadequate volume of blood received in culture bottles   Culture   Final    NO GROWTH 1 DAY Performed at [facility] [address], [place], [place] [address]    Report Status PENDING  Incomplete  Culture, blood (Routine X 2) w Reflex to ID Panel     Status: None (Preliminary result)   Collection Time: 02/01/21  1:07 PM   Specimen: BLOOD RIGHT FOREARM  Result Value Ref Range Status   Specimen Description BLOOD RIGHT FOREARM  Final   Special Requests   Final    BOTTLES DRAWN AEROBIC AND ANAEROBIC Blood Culture results may not be optimal due to an inadequate volume of blood received in culture bottles   Culture   Final    NO GROWTH 1 DAY Performed at [facility], [place] [address], [place], [place] [address]    Report Status PENDING  Incomplete  Resp Panel by RT-PCR (Flu A&B, Covid) Nasopharyngeal Swab     Status: None   Collection Time: 02/01/21  2:30 PM   Specimen: Nasopharyngeal Swab; Nasopharyngeal(NP) swabs in vial transport medium  Result Value Ref Range Status   SARS Coronavirus 2 by RT PCR NEGATIVE NEGATIVE Final    Comment: (NOTE) SARS-CoV-2 target nucleic acids are NOT DETECTED.  The SARS-CoV-2 RNA is generally detectable in upper respiratory specimens during the acute phase of infection. The lowest concentration of SARS-CoV-2 viral copies this assay can detect is 138  copies/mL. A negative result does not preclude SARS-Cov-2 infection and should not be used as the sole basis for treatment or other patient management decisions. A negative result may occur with  improper specimen collection/handling, submission of specimen other than nasopharyngeal swab, presence of viral mutation(s) within the areas targeted by this assay, and inadequate number of viral copies(<138 copies/mL). A negative result must be combined with clinical observations, patient history, and epidemiological information. The expected result is Negative.  Fact Sheet for Patients:  EntrepreneurPulse.com.au  Fact Sheet for Healthcare Providers:  IncredibleEmployment.be  This test is no t yet approved or cleared by the [place] FDA and  has been authorized for detection and/or diagnosis of SARS-CoV-2 by FDA under an Emergency Use Authorization (EUA). This EUA will remain  in effect (meaning this test can be used) for the duration of the COVID-19 declaration under Section 564(b)(1) of the Act, 21 U.S.C.section 360bbb-3(b)(1), unless the authorization is terminated  or revoked sooner.       Influenza A by PCR NEGATIVE NEGATIVE Final   Influenza B by PCR NEGATIVE NEGATIVE Final    Comment: (NOTE) The Xpert Xpress SARS-CoV-2/FLU/RSV plus assay is intended as an aid in the diagnosis of influenza from Nasopharyngeal swab specimens and should not be used as a sole basis for treatment. Nasal washings and aspirates are unacceptable for Xpert Xpress SARS-CoV-2/FLU/RSV testing.  Fact Sheet for Patients: EntrepreneurPulse.com.au  Fact Sheet for Healthcare Providers: IncredibleEmployment.be  This test is not yet approved or cleared by the [place] FDA and has been authorized for detection and/or diagnosis of SARS-CoV-2 by FDA under an Emergency Use Authorization (EUA). This EUA will remain in effect (meaning  this test can be used) for the duration of the COVID-19 declaration under Section  564(b)(1) of the Act, 21 U.S.C. section 360bbb-3(b)(1), unless the authorization is terminated or revoked.  Performed at [facility], [place] [address] [place], [place] [address]   Surgical pcr screen     Status: None   Collection Time: 02/02/21 12:27 AM   Specimen: Nasal Mucosa; Nasal Swab  Result Value Ref Range Status   MRSA, PCR NEGATIVE NEGATIVE Final   Staphylococcus aureus NEGATIVE NEGATIVE Final    Comment: (NOTE) The Xpert SA Assay (FDA approved for NASAL specimens in patients 71 years of age and older), is one component of a comprehensive surveillance program. It is not intended to diagnose infection nor to guide or monitor treatment. Performed at [facility], [place] [address], [place], [place] [address]   Aerobic Culture w Gram Stain (superficial specimen)     Status: None (Preliminary result)   Collection Time: 02/02/21  8:58 AM   Specimen: Wound  Result Value Ref Range Status   Specimen Description WOUND  Final   Special Requests LEFT FOREARM WD SPEC A  Final   Gram Stain NO WBC SEEN NO ORGANISMS SEEN   Final   Culture   Final    CULTURE REINCUBATED FOR BETTER GROWTH Performed at [facility], [place] [address], [place], [place] [address]    Report Status PENDING  Incomplete    Radiology Reports DG Chest 1 View  Result Date: 01/12/2021 CLINICAL DATA:  Pain following fall EXAM: CHEST  1 VIEW COMPARISON:  September 19, 2020 FINDINGS: There is cardiomegaly with pulmonary venous hypertension. The interstitium is mildly prominent without overt pulmonary edema. No appreciable airspace opacity. No adenopathy. No pneumothorax. There is a skin fold on the left. No fracture evident. Postoperative change in lower cervical spine. There is aortic atherosclerosis. IMPRESSION: Suspect underlying chronic bronchitis. No edema or consolidation. There is cardiomegaly with pulmonary vascular  congestion. No pneumothorax. Aortic Atherosclerosis (ICD10-I70.0). Electronically Signed   By: Lowella Grip III M.D.   On: 01/12/2021 18:02   DG Pelvis 1-2 Views  Result Date: 01/12/2021 CLINICAL DATA:  Pain following fall EXAM: PELVIS - 1-2 VIEW COMPARISON:  None. FINDINGS: There is no evidence of pelvic fracture or dislocation. Joint spaces appear normal. Multiple foci of vascular calcification noted bilaterally. IMPRESSION: No fracture or dislocation. No evident arthropathy. Multiple foci of vascular calcification noted. Electronically Signed   By: Lowella Grip III M.D.   On: 01/12/2021 18:07   DG Elbow Complete Left  Result Date: 01/12/2021 CLINICAL DATA:  Pain following fall. Reported recent arm region surgery EXAM: LEFT ELBOW - COMPLETE 3+ VIEW COMPARISON:  None. FINDINGS: Frontal, lateral, and bilateral oblique views were obtained. There is soft tissue swelling. There is no appreciable fracture or dislocation. No joint effusion. No joint space narrowing or erosion. Multiple foci of arterial vascular calcification noted. Note that there is soft tissue air in the volar aspect of the distal humerus. IMPRESSION: No fracture or dislocation. Soft tissue swelling and soft tissue air present. A degree of infection within the soft tissues of the distal upper arm region is questioned. Nearby surgical clips. Multiple foci of arterial vascular calcification noted. Electronically Signed   By: Lowella Grip III M.D.   On: 01/12/2021 18:05   DG Forearm Left  Result Date: 01/12/2021 CLINICAL DATA:  Pain following fall.  Recent fistula surgery EXAM: LEFT FOREARM - 2 VIEW COMPARISON:  None. FINDINGS: Frontal and lateral views were obtained. No acute fracture or dislocation. No appreciable joint space narrowing or  erosion. Multiple foci of vascular calcification noted. Soft tissue air is noted volar to the distal upper extremity. IMPRESSION: Foci of soft tissue air may be have infectious etiology or  may be secondary to recent surgery. Clinical assessment in this regard warranted. No acute fracture or dislocation. No appreciable joint space narrowing or erosion. Multiple foci of arterial vascular calcification noted. Electronically Signed   By: Lowella Grip III M.D.   On: 01/12/2021 18:06   CT Head Wo Contrast  Result Date: 01/12/2021 CLINICAL DATA:  Fall EXAM: CT HEAD WITHOUT CONTRAST CT CERVICAL SPINE WITHOUT CONTRAST TECHNIQUE: Multidetector CT imaging of the head and cervical spine was performed following the standard protocol without intravenous contrast. Multiplanar CT image reconstructions of the cervical spine were also generated. COMPARISON:  None. FINDINGS: CT HEAD FINDINGS Brain: No evidence of acute infarction, hemorrhage, hydrocephalus, extra-axial collection or mass lesion/mass effect. Periventricular white matter hypodensity. Vascular: No hyperdense vessel or unexpected calcification. Skull: Normal. Negative for fracture or focal lesion. Sinuses/Orbits: No acute finding. Other: None. CT CERVICAL SPINE FINDINGS Alignment: Degenerative straightening and reversal of the normal cervical lordosis. Skull base and vertebrae: No acute fracture. No primary bone lesion or focal pathologic process. Soft tissues and spinal canal: No prevertebral fluid or swelling. No visible canal hematoma. Disc levels: Status post anterior cervical discectomy and fusion of C5 through C7. Upper chest: Negative. Other: None. IMPRESSION: 1. No acute intracranial pathology. Small-vessel white matter disease. 2. No fracture or static subluxation of the cervical spine. 3. Status post anterior cervical discectomy and fusion of C5 through C7. Electronically Signed   By: Eddie Candle M.D.   On: 01/12/2021 17:50   CT Cervical Spine Wo Contrast  Result Date: 01/12/2021 CLINICAL DATA:  Fall EXAM: CT HEAD WITHOUT CONTRAST CT CERVICAL SPINE WITHOUT CONTRAST TECHNIQUE: Multidetector CT imaging of the head and cervical spine  was performed following the standard protocol without intravenous contrast. Multiplanar CT image reconstructions of the cervical spine were also generated. COMPARISON:  None. FINDINGS: CT HEAD FINDINGS Brain: No evidence of acute infarction, hemorrhage, hydrocephalus, extra-axial collection or mass lesion/mass effect. Periventricular white matter hypodensity. Vascular: No hyperdense vessel or unexpected calcification. Skull: Normal. Negative for fracture or focal lesion. Sinuses/Orbits: No acute finding. Other: None. CT CERVICAL SPINE FINDINGS Alignment: Degenerative straightening and reversal of the normal cervical lordosis. Skull base and vertebrae: No acute fracture. No primary bone lesion or focal pathologic process. Soft tissues and spinal canal: No prevertebral fluid or swelling. No visible canal hematoma. Disc levels: Status post anterior cervical discectomy and fusion of C5 through C7. Upper chest: Negative. Other: None. IMPRESSION: 1. No acute intracranial pathology. Small-vessel white matter disease. 2. No fracture or static subluxation of the cervical spine. 3. Status post anterior cervical discectomy and fusion of C5 through C7. Electronically Signed   By: Eddie Candle M.D.   On: 01/12/2021 17:50   PERIPHERAL VASCULAR CATHETERIZATION  Result Date: 01/15/2021 Patient name: Bhargav Barbaro MRN: [id number] DOB: 05-17-1961 Sex: male 01/15/2021 Pre-operative Diagnosis: ESRD Post-operative diagnosis:  Same Surgeon:  [name] Procedure Performed:  1.  Ultrasound-guided access, left cephalic vein  2.  Fistulogram  3.  Venoplasty, left subclavian vein  4.  Conscious sedation, 20 minutes Indications: The patient recently underwent conversion of a left radiocephalic fistula to a brachiocephalic fistula.  He has had significant arm swelling since that time.  He is here for fistulogram Procedure:  The patient was identified in the holding area and taken to room 8.  The patient was then placed supine on the  table and prepped and draped in the usual sterile fashion.  A time out was called.  Conscious sedation was administered with the use of IV fentanyl and Versed under continuous physician and nurse monitoring.  Heart rate, blood pressure, and oxygen saturations were continuously monitored.  Total sedation time was 20 minutes ultrasound was used to evaluate the fistula.  The vein was patent and compressible.  A digital ultrasound image was acquired.  The fistula was then accessed under ultrasound guidance using a micropuncture needle.  An 018 wire was then asvanced without resistance and a micropuncture sheath was placed.  Contrast injections were then performed through the sheath. Findings: The visualized portions of the cephalic vein fistula are patent without stenosis.  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  The remaining portion of the central venous system is widely patent.  Intervention: After the above images were acquired the decision made to proceed with intervention.  Over an 035 wire, a 7 French sheath was placed.  I used a 12 x 60 Mustang balloon to perform venoplasty of the left subclavian vein stenosis.  This was done for 30 seconds at 15 atm.  Follow-up imaging revealed resolution of the stenosis.  The wire and balloon were removed.  The sheath was removed and a Monocryl was used for closure Impression:  #1  Approximately 70% left subclavian vein (central vein), successfully treated with a 12 x 60 balloon with no residual stenosis V. [name], M.D., [facility] [place] Office: [phone] Pager:  [phone]  DG CHEST PORT 1 VIEW  Result Date: 02/02/2021 CLINICAL DATA:  S/P hemodialysis catheter insertion EXAM: PORTABLE CHEST - 1 VIEW COMPARISON:  01/13/2021 FINDINGS: Tunneled right IJ hemodialysis catheter extends to the cavoatrial junction. No pneumothorax. Mild cardiomegaly stable.  Aortic Atherosclerosis (ICD10-170.0). Hazy  predominately perihilar interstitial opacities suggesting mild edema. Patchy somewhat coarse poorly marginated airspace opacities in the right apex and left retrocardiac region. Blunting of the left lateral costophrenic angle suggesting small effusion. Cervical fixation hardware partially visualized. IMPRESSION: 1. Central venous catheter to cavoatrial junction without pneumothorax. Electronically Signed   By: Lucrezia Europe M.D.   On: 02/02/2021 09:43   DG CHEST PORT 1 VIEW  Result Date: 01/14/2021 CLINICAL DATA:  Follow-up EXAM: PORTABLE CHEST - 1 VIEW COMPARISON:  01/12/2021 FINDINGS: The mediastinal contours are within normal limits. Unchanged moderate cardiomegaly. Improved aeration of the lungs bilaterally with mild persistent scattered peribronchovascular patchy opacities. No significant pleural effusion or pneumothorax. Atherosclerotic calcification of the aortic arch. Multilevel cervicothoracic anterior fusion hardware remains in place without complicating feature. No acute osseous abnormality. IMPRESSION: 1. Improved aeration bilaterally compatible with improving mild pulmonary edema. 2. Stable moderate cardiomegaly. 3.  Aortic Atherosclerosis (ICD10-I70.0). Electronically Signed   By: Ruthann Cancer MD   On: 01/14/2021 08:23   DG Fluoro Guide CV Line-No Report  Result Date: 02/02/2021 Fluoroscopy was utilized by the requesting physician.  No radiographic interpretation.   ECHOCARDIOGRAM COMPLETE  Result Date: 01/13/2021    ECHOCARDIOGRAM REPORT   Patient Name:   ALDRIC WENZLER Date of Exam: 01/13/2021 Medical Rec #:  [id number]        Height:       68.0 in Accession #:    [id number]       Weight:       133.6 lb Date of Birth:  1961/05/08        BSA:  1.722 m Patient Age:    87 years         BP:           118/73 mmHg Patient Gender: M                HR:           78 bpm. Exam Location:  Inpatient Procedure: 2D Echo, 3D Echo, Cardiac Doppler and Color Doppler Indications:    Syncope R55   History:        Patient has prior history of Echocardiogram examinations, most                 recent 05/17/2019. CHF, Pulmonary HTN; Risk Factors:Hypertension                 and Dyslipidemia. End stage renal disease, Macrocytic anemia.                 GERD. Thoracic ascending aortic aneurysm 4.5 by CT.  Sonographer:    Darlina Sicilian RDCS Referring Phys: [id number] [place]  Sonographer Comments: Global longitudinal strain was attempted. IMPRESSIONS  1. Left ventricular ejection fraction, by estimation, is 60 to 65%. The left ventricle has normal function. The left ventricle has no regional wall motion abnormalities. There is moderate concentric left ventricular hypertrophy. Left ventricular diastolic parameters are consistent with Grade I diastolic dysfunction (impaired relaxation).  2. Right ventricular systolic function is mildly reduced. The right ventricular size is mildly enlarged. There is severely elevated pulmonary artery systolic pressure. The estimated right ventricular systolic pressure is 18.5 mmHg.  3. There is a trivial pericardial effusion posterior to the left ventricle.  4. The mitral valve is abnormal. Mild mitral valve regurgitation. Moderate mitral annular calcification.  5. Tricuspid valve regurgitation is moderate.  6. The aortic valve is tricuspid. There is moderate calcification of the aortic valve. Aortic valve regurgitation is mild. Mild to moderate aortic valve stenosis. Aortic valve mean gradient measures 12.0 mmHg. Dimentionless index 0.58.  7. Aortic dilatation noted. There is mild dilatation of the ascending aorta, measuring 41 mm.  8. The inferior vena cava is dilated in size with <50% respiratory variability, suggesting right atrial pressure of 15 mmHg. FINDINGS  Left Ventricle: Left ventricular ejection fraction, by estimation, is 60 to 65%. The left ventricle has normal function. The left ventricle has no regional wall motion abnormalities. The left ventricular internal  cavity size was small. There is moderate  concentric left ventricular hypertrophy. Left ventricular diastolic parameters are consistent with Grade I diastolic dysfunction (impaired relaxation). Right Ventricle: The right ventricular size is mildly enlarged. Right vetricular wall thickness was not assessed. Right ventricular systolic function is mildly reduced. There is severely elevated pulmonary artery systolic pressure. The tricuspid regurgitant velocity is 3.87 m/s, and with an assumed right atrial pressure of 15 mmHg, the estimated right ventricular systolic pressure is 63.1 mmHg. Left Atrium: Left atrial size was normal in size. Right Atrium: Right atrial size was normal in size. Pericardium: Trivial pericardial effusion is present. The pericardial effusion is posterior to the left ventricle. Mitral Valve: The mitral valve is abnormal. There is mild calcification of the mitral valve leaflet(s). Moderate mitral annular calcification. Mild mitral valve regurgitation. Tricuspid Valve: The tricuspid valve is grossly normal. Tricuspid valve regurgitation is moderate. Aortic Valve: The aortic valve is tricuspid. There is moderate calcification of the aortic valve. There is mild aortic valve annular calcification. Aortic valve regurgitation is mild. Aortic regurgitation PHT measures 325 msec.  Mild to moderate aortic stenosis is present. Aortic valve mean gradient measures 12.0 mmHg. Aortic valve peak gradient measures 23.2 mmHg. Aortic valve area, by VTI measures 1.46 cm. Pulmonic Valve: The pulmonic valve was grossly normal. Pulmonic valve regurgitation is trivial. Aorta: The aortic root is normal in size and structure and aortic dilatation noted. There is mild dilatation of the ascending aorta, measuring 41 mm. Venous: The inferior vena cava is dilated in size with less than 50% respiratory variability, suggesting right atrial pressure of 15 mmHg. IAS/Shunts: No atrial level shunt detected by color flow Doppler.   LEFT VENTRICLE PLAX 2D LVIDd:         3.80 cm     Diastology LVIDs:         2.40 cm     LV e' medial:    3.44 cm/s LV PW:         1.00 cm     LV E/e' medial:  25.4 LV IVS:        1.10 cm     LV e' lateral:   4.26 cm/s LVOT diam:     1.80 cm     LV E/e' lateral: 20.5 LV SV:         54 LV SV Index:   32 LVOT Area:     2.54 cm  LV Volumes (MOD) LV vol d, MOD A2C: 74.7 ml LV vol d, MOD A4C: 67.4 ml LV vol s, MOD A2C: 43.2 ml LV vol s, MOD A4C: 33.1 ml LV SV MOD A2C:     31.5 ml LV SV MOD A4C:     67.4 ml LV SV MOD BP:      34.2 ml RIGHT VENTRICLE RV S prime:     14.90 cm/s TAPSE (M-mode): 2.0 cm LEFT ATRIUM             Index       RIGHT ATRIUM           Index LA diam:        2.80 cm 1.63 cm/m  RA Area:     18.70 cm LA Vol (A2C):   66.2 ml 38.45 ml/m RA Volume:   52.80 ml  30.67 ml/m LA Vol (A4C):   40.7 ml 23.64 ml/m LA Biplane Vol: 57.9 ml 33.63 ml/m  AORTIC VALVE AV Area (Vmax):    1.26 cm AV Area (Vmean):   1.31 cm AV Area (VTI):     1.46 cm AV Vmax:           241.00 cm/s AV Vmean:          156.000 cm/s AV VTI:            0.372 m AV Peak Grad:      23.2 mmHg AV Mean Grad:      12.0 mmHg LVOT Vmax:         119.00 cm/s LVOT Vmean:        80.300 cm/s LVOT VTI:          0.214 m LVOT/AV VTI ratio: 0.58 AI PHT:            325 msec  AORTA Ao Root diam: 3.70 cm Ao Asc diam:  4.10 cm MITRAL VALVE                TRICUSPID VALVE MV Area (PHT): 1.66 cm     TR Peak grad:   59.9 mmHg MV Decel Time: 458 msec     TR Vmax:  387.00 cm/s MV E velocity: 87.40 cm/s MV A velocity: 113.00 cm/s  SHUNTS MV E/A ratio:  0.77         Systemic VTI:  0.21 m                             Systemic Diam: 1.80 cm Rozann Lesches MD Electronically signed by Rozann Lesches MD Signature Date/Time: 01/13/2021/2:53:42 PM    Final    VAS [place] UPPER EXTREMITY VENOUS DUPLEX  Result Date: 01/13/2021 UPPER VENOUS STUDY  Patient Name:  KIMBALL APPLEBY  Date of Exam:   01/13/2021 Medical Rec #: [id number]         Accession #:    [id number] Date  of Birth: 04/27/1961         Patient Gender: M Patient Age:   59Y Exam Location:  [facility] Procedure:      VAS [place] UPPER EXTREMITY VENOUS DUPLEX Referring Phys: [id number] JARED M GARDNER --------------------------------------------------------------------------------  Indications: Pain, and Swelling Risk Factors: Surgery - Recent ligation of radiocephalic AVF and creation of brachiocephalic AVF Recent fall with skin tear to LUE. Comparison Study: Previous 07/03/20 - negative Performing Technologist: Rogelia Rohrer  Examination Guidelines: A complete evaluation includes B-mode imaging, spectral Doppler, color Doppler, and power Doppler as needed of all accessible portions of each vessel. Bilateral testing is considered an integral part of a complete examination. Limited examinations for reoccurring indications may be performed as noted.  Right Findings: +----------+------------+---------+-----------+----------+-------+ RIGHT     CompressiblePhasicitySpontaneousPropertiesSummary +----------+------------+---------+-----------+----------+-------+ Subclavian    Full       Yes       Yes                      +----------+------------+---------+-----------+----------+-------+  Left Findings: +----------+------------+---------+-----------+----------+-------+ LEFT      CompressiblePhasicitySpontaneousPropertiesSummary +----------+------------+---------+-----------+----------+-------+ IJV           Full       Yes       Yes                      +----------+------------+---------+-----------+----------+-------+ Subclavian    Full       Yes       Yes                      +----------+------------+---------+-----------+----------+-------+ Axillary      Full       Yes       Yes                      +----------+------------+---------+-----------+----------+-------+ Brachial      Full       Yes       Yes                       +----------+------------+---------+-----------+----------+-------+ Radial        Full                                          +----------+------------+---------+-----------+----------+-------+ Ulnar         Full                                          +----------+------------+---------+-----------+----------+-------+  Cephalic      Full                                          +----------+------------+---------+-----------+----------+-------+ Basilic       Full       Yes       Yes                      +----------+------------+---------+-----------+----------+-------+ Patient negative for DVT however there is concern for disfunction of AVF versus arterial disease. Hand is cold to the touch with decreased arterial flow in forearm. would recommend vascular consult and further imaging.  Summary:  Right: No evidence of thrombosis in the subclavian.  Left: No evidence of deep vein thrombosis in the upper extremity. No evidence of superficial vein thrombosis in the upper extremity.  *See table(s) above for measurements and observations.  Vascular consult recommended. Diagnosing physician: Servando Snare MD Electronically signed by Servando Snare MD on 01/13/2021 at 12:06:47 PM.    Final

## 2021-02-03 NOTE — Plan of Care (Signed)
  Problem: Education: Goal: Knowledge of General Education information will improve Description: Including pain rating scale, medication(s)/side effects and non-pharmacologic comfort measures Outcome: Completed/Met   Problem: Clinical Measurements: Goal: Diagnostic test results will improve Outcome: Completed/Met   

## 2021-02-04 DIAGNOSIS — L03114 Cellulitis of left upper limb: Secondary | ICD-10-CM

## 2021-02-04 LAB — CBC WITH DIFFERENTIAL/PLATELET
Abs Immature Granulocytes: 0.02 10*3/uL (ref 0.00–0.07)
Basophils Absolute: 0.1 10*3/uL (ref 0.0–0.1)
Basophils Relative: 1 %
Eosinophils Absolute: 0.2 10*3/uL (ref 0.0–0.5)
Eosinophils Relative: 4 %
HCT: 25.5 % — ABNORMAL LOW (ref 39.0–52.0)
Hemoglobin: 8.2 g/dL — ABNORMAL LOW (ref 13.0–17.0)
Immature Granulocytes: 1 %
Lymphocytes Relative: 10 %
Lymphs Abs: 0.4 10*3/uL — ABNORMAL LOW (ref 0.7–4.0)
MCH: 33.5 pg (ref 26.0–34.0)
MCHC: 32.2 g/dL (ref 30.0–36.0)
MCV: 104.1 fL — ABNORMAL HIGH (ref 80.0–100.0)
Monocytes Absolute: 0.3 10*3/uL (ref 0.1–1.0)
Monocytes Relative: 7 %
Neutro Abs: 3.4 10*3/uL (ref 1.7–7.7)
Neutrophils Relative %: 77 %
Platelets: 196 10*3/uL (ref 150–400)
RBC: 2.45 MIL/uL — ABNORMAL LOW (ref 4.22–5.81)
RDW: 14.7 % (ref 11.5–15.5)
WBC: 4.4 10*3/uL (ref 4.0–10.5)
nRBC: 0 % (ref 0.0–0.2)

## 2021-02-04 LAB — BASIC METABOLIC PANEL
Anion gap: 12 (ref 5–15)
BUN: 22 mg/dL (ref 8–23)
CO2: 28 mmol/L (ref 22–32)
Calcium: 10.2 mg/dL (ref 8.9–10.3)
Chloride: 94 mmol/L — ABNORMAL LOW (ref 98–111)
Creatinine, Ser: 5.31 mg/dL — ABNORMAL HIGH (ref 0.61–1.24)
GFR, Estimated: 12 mL/min — ABNORMAL LOW (ref >60)
Glucose, Bld: 89 mg/dL (ref 70–99)
Potassium: 4.1 mmol/L (ref 3.5–5.1)
Sodium: 134 mmol/L — ABNORMAL LOW (ref 135–145)

## 2021-02-04 LAB — AEROBIC CULTURE W GRAM STAIN (SUPERFICIAL SPECIMEN): Gram Stain: NONE SEEN

## 2021-02-04 NOTE — Plan of Care (Signed)
  Problem: Health Behavior/Discharge Planning: Goal: Ability to manage health-related needs will improve Outcome: Completed/Met   Problem: Clinical Measurements: Goal: Ability to maintain clinical measurements within normal limits will improve Outcome: Completed/Met Goal: Will remain free from infection Outcome: Completed/Met Goal: Cardiovascular complication will be avoided Outcome: Completed/Met   Problem: Activity: Goal: Risk for activity intolerance will decrease Outcome: Completed/Met   Problem: Nutrition: Goal: Adequate nutrition will be maintained Outcome: Completed/Met   Problem: Coping: Goal: Level of anxiety will decrease Outcome: Completed/Met   Problem: Elimination: Goal: Will not experience complications related to bowel motility Outcome: Completed/Met Goal: Will not experience complications related to urinary retention Outcome: Completed/Met

## 2021-02-04 NOTE — TOC Progression Note (Signed)
Transition of Care [facility]) - Progression Note    Patient Details  Name: Thomas Robinson MRN: [id number] Date of Birth: 1961-03-31  Transition of Care [facility]) CM/SW Contact  Loletha Grayer Beverely Pace, RN Phone Number: 02/04/2021, 10:54 AM  Clinical Narrative:   Case manager spoke with patient concerning discharge needs. Discussed Home Health agency choices. Patient has no preference, referral called to Adela Lank, [facility] Liaison. Patient politely declines RW, states it will never be used. No further needs identified.     Expected Discharge Plan: [place] Barriers to Discharge: No Barriers Identified  Expected Discharge Plan and Services Expected Discharge Plan: [place] In-house Referral: NA Discharge Planning Services: CM Consult Post Acute Care Choice: [place] arrangements for the past 2 months: Single Family Home Expected Discharge Date: 02/04/21               DME Arranged: Other see comment (declining RW) DME Agency: NA       HH Arranged: RN,PT [place] Agency: [place] Date [facility] Agency Contacted: 02/04/21 Time [place] Contacted: 1030 Representative spoke with at [place]: [place] ([place]) Interventions    Readmission Risk Interventions Readmission Risk Prevention Plan 01/15/2021 09/11/2020  Transportation Screening Complete Complete  Medication Review Press photographer) Complete -  PCP or Specialist appointment within 3-5 days of discharge - Not Complete  PCP/Specialist Appt Not Complete comments - first available appt is 09/26/20  [place] or Home Care Consult Complete (No Data)  SW Recovery Care/Counseling Consult Complete Complete  Palliative Care Screening Not Applicable Not [place] Not Applicable Not Applicable  Some recent data might be hidden

## 2021-02-04 NOTE — Progress Notes (Signed)
  Progress Note    02/04/2021 7:45 AM 2 Days Post-Op  Subjective:  L arm feels better   Vitals:   02/03/21 2107 02/04/21 0515  BP: (!) 124/54 (!) 115/45  Pulse: 93 61  Resp: 18 18  Temp: 99.1 F (37.3 C) 98.9 F (37.2 C)  SpO2: 94% 92%   Physical Exam: Lungs:  Non labored Incisions:  L arm incision healing well Extremities:  L arm wound with dressing in place; palpable L radial pulse Neurologic: A&O  CBC    Component Value Date/Time   WBC 4.4 02/04/2021 0448   RBC 2.45 (L) 02/04/2021 0448   HGB 8.2 (L) 02/04/2021 0448   HCT 25.5 (L) 02/04/2021 0448   HCT 27.3 (L) 05/17/2019 0203   PLT 196 02/04/2021 0448   MCV 104.1 (H) 02/04/2021 0448   MCH 33.5 02/04/2021 0448   MCHC 32.2 02/04/2021 0448   RDW 14.7 02/04/2021 0448   LYMPHSABS 0.4 (L) 02/04/2021 0448   MONOABS 0.3 02/04/2021 0448   EOSABS 0.2 02/04/2021 0448   BASOSABS 0.1 02/04/2021 0448    BMET    Component Value Date/Time   NA 134 (L) 02/04/2021 0448   NA 141 03/21/2020 1515   K 4.1 02/04/2021 0448   CL 94 (L) 02/04/2021 0448   CO2 28 02/04/2021 0448   GLUCOSE 89 02/04/2021 0448   BUN 22 02/04/2021 0448   BUN 53 (H) 03/21/2020 1515   CREATININE 5.31 (H) 02/04/2021 0448   CALCIUM 10.2 02/04/2021 0448   GFRNONAA 12 (L) 02/04/2021 0448   GFRAA 4 (L) 05/30/2020 1643    INR    Component Value Date/Time   INR 1.2 01/12/2021 1658     Intake/Output Summary (Last 24 hours) at 02/04/2021 0745 Last data filed at 02/04/2021 0700 Gross per 24 hour  Intake 600 ml  Output 2600 ml  Net -2000 ml     Assessment/Plan:  60 y.o. male is s/p L arm fistula ligation and debridement of L arm wound 2 Days Post-Op   L arm well perfused with palpable radial pulse Continue wet to dry dressing changes BID Ok for discharge home with [facility] for dressing changes Office will call to arrange wound check in 2-3 weeks   Dagoberto Ligas, PA-C [facility] [phone] 02/04/2021 7:45 AM

## 2021-02-04 NOTE — Discharge Summary (Signed)
Thomas Robinson VEL:[id number] DOB: 02/27/1961 DOA: 02/01/2021  PCP: [name], PA  Admit date: 02/01/2021  Discharge date: 02/04/2021  Admitted From: Home   Disposition:  Home   Recommendations for Outpatient Follow-up:   Follow up with PCP in 1-2 weeks  PCP Please obtain BMP/CBC, 2 view CXR in 1week,  (see Discharge instructions)   PCP Please follow up on the following pending results: Monitor L. Arm wound   Home Health: RN   Equipment/Devices: None  Consultations: VVS, Renal Discharge Condition: Stable    CODE STATUS: Full    Diet Recommendation: Renal-low carbohydrate diet with 1200 cc of fluid restriction per day     Chief Complaint  Patient presents with  . wound infection     Brief history of present illness from the day of admission and additional interim summary    60 y.o.malewith HTN,chronic diastolic heart failure(-ve cardiac cath Dec 2021),ESRDon HD MWFMonday who is s/p left forearm radiaocephalic fistula ligationand creation of brachiocephalic fistula by Dr. Samuel Bouche 01/10/21. He then under went fistulogram with Venoplasty, left subclavian vein.Pt with h/o chronic hypoxic resp failure, supposed to be on 2L home O2, presented to the ER with left arm pain and swelling he was thought to have developed a hematoma in his left AV fistula site was seen by vascular surgery and was taken to the OR for ligation of his left arm AV fistula, admitted for further care.                                                                 Hospital Course      1. AV fistula complications with Venous HTN Thomas Robinson /eschar of left forearm:Underwent Ligation of the left arm AV fistula, debridement left forearm and placement of tunneled right IJ dialysis catheter on 02/02/2021 by vascular surgery, currently  has compression wrap and arm is better, less pain, continue supportive care, vascular surgery has cleared him for home discharge, he also is adamant on going home will be discharged home with twice daily wet-to-dry dressings by home health nurse, requested home health nurse through social work.  He is currently relatively symptom-free, will follow with PCP and vascular surgery within 7 to 14 days of discharge.  2.ESRD on HD: Nephrology following undergoing HD via right IJ tunneled catheter.  3.Acute hypoxic respiratory failure, history of asthma:Patient was saturating 89% on arrival and placed on 2 L nasal cannula.  This likely was due to fluid overload from ESRD, improved after HD.  Now symptom-free.  4.Hypertension:Resumed home medications.BP somewhat elevated intermittently likely due to pain  5. Acute on chronic anemia : Due to ESRD, no transfusions this admission no signs of active bleeding, will continue to monitor.    Discharge diagnosis     Active Problems:   Cellulitis of arm,  left    Discharge instructions    Discharge Instructions    Discharge instructions   Complete by: As directed    Follow with Primary MD [name], PA in 7 days   Get CBC, CMP, 2 view Chest X ray -  checked next visit within 1 week by Primary MD    Activity: As tolerated with Full fall precautions use walker/cane & assistance as needed  Disposition Home   Diet: Renal-low carbohydrate diet with 1200 cc of fluid restriction per day  Special Instructions: If you have smoked or chewed Tobacco  in the last 2 yrs please stop smoking, stop any regular Alcohol  and or any Recreational drug use.  On your next visit with your primary care physician please Get Medicines reviewed and adjusted.  Please request your Prim.MD to go over all Hospital Tests and Procedure/Radiological results at the follow up, please get all Hospital records sent to your Prim MD by signing hospital release before  you go home.  If you experience worsening of your admission symptoms, develop shortness of breath, life threatening emergency, suicidal or homicidal thoughts you must seek medical attention immediately by calling 911 or calling your MD immediately  if symptoms less severe.  You Must read complete instructions/literature along with all the possible adverse reactions/side effects for all the Medicines you take and that have been prescribed to you. Take any new Medicines after you have completely understood and accpet all the possible adverse reactions/side effects.   Discharge wound care:   Complete by: As directed    Left arm wet-to-dry dressing twice a day,, keep your arm clean and dry   Face-to-face encounter (required for Medicare/Medicaid patients)   Complete by: As directed    I Lala Lund certify that this patient is under my care and that I, or a nurse practitioner or physician's assistant working with me, had a face-to-face encounter that meets the physician face-to-face encounter requirements with this patient on 02/04/2021. The encounter with the patient was in whole, or in part for the following medical condition(s) which is the primary reason for home health care (List medical condition): L arm wound   The encounter with the patient was in whole, or in part, for the following medical condition, which is the primary reason for home health care: L. arm wet to dry dressing twice dailt   I certify that, based on my findings, the following services are medically necessary home health services: Nursing   Reason for Medically Necessary Home Health Services: Skilled Nursing- Post-Surgical Wound Assessment and Care   My clinical findings support the need for the above services: Unable to leave home safely without assistance and/or assistive device   Further, I certify that my clinical findings support that this patient is homebound due to: Unable to leave home safely without assistance   Home  Health   Complete by: As directed    To provide the following care/treatments: RN   Increase activity slowly   Complete by: As directed       Discharge Medications   Allergies as of 02/04/2021   No Known Allergies     Medication List    TAKE these medications   acetaminophen 500 MG tablet Commonly known as: TYLENOL Take 500 mg by mouth every 6 (six) hours as needed for moderate pain or headache.   albuterol 108 (90 Base) MCG/ACT inhaler Commonly known as: VENTOLIN HFA Inhale 2 puffs into the lungs in the morning and at bedtime.  amLODipine 10 MG tablet Commonly known as: NORVASC Take 10 mg by mouth daily.   Breo Ellipta 100-25 MCG/INH Aepb Generic drug: fluticasone furoate-vilanterol Inhale 1 puff into the lungs 2 (two) times daily as needed (sob/wheezing).   cholecalciferol 25 MCG (1000 UNIT) tablet Commonly known as: VITAMIN D3 Take 1,000 Units by mouth once a week.   DIPHENHYDRAMINE HCL PO Take 25 mg by mouth as needed (allergies).   doxercalciferol 0.5 MCG capsule Commonly known as: HECTOROL Take 0.5 mcg by mouth 3 (three) times a week.   furosemide 80 MG tablet Commonly known as: LASIX Take 80 mg by mouth as directed. Take Daily PRN On Non-Dialysis Days (Sun, Tues, Thurs, Sat)   hydrALAZINE 25 MG tablet Commonly known as: APRESOLINE Take 50 mg by mouth in the morning and at bedtime.   metoprolol tartrate 100 MG tablet Commonly known as: LOPRESSOR Take 0.5 tablets (50 mg total) by mouth 2 (two) times daily. What changed:   how much to take  when to take this   multivitamin Tabs tablet Take 1 tablet by mouth daily.   pregabalin 25 MG capsule Commonly known as: LYRICA Take 25 mg by mouth daily as needed (pain).   sevelamer carbonate 800 MG tablet Commonly known as: RENVELA Take 800-1,600 mg by mouth daily as needed (with snacks or meals). With Snacks & Meals   traMADol 50 MG tablet Commonly known as: ULTRAM Take 1 tablet (50 mg total) by  mouth every 6 (six) hours as needed for moderate pain.            Discharge Care Instructions  (From admission, onward)         Start     Ordered   02/04/21 0000  Discharge wound care:       Comments: Left arm wet-to-dry dressing twice a day,, keep your arm clean and dry   02/04/21 1610           Follow-up Information    Serafina Mitchell, MD In 2 weeks.   Specialties: Vascular Surgery, Cardiology Why: Office will call you to arrange your appt (sent) Contact information: [place] [address] [address], [place], [place]. Schedule an appointment as soon as possible for a visit in 3 day(s).   Specialty: Physician Assistant Contact information: [place] [address] [phone]        [name], DO. Schedule an appointment as soon as possible for a visit in 1 week(s).   Specialty: Cardiology Contact information: [place] [address] [phone]               Major procedures and Radiology Reports - PLEASE review detailed and final reports thoroughly  -       DG Chest 1 View  Result Date: 01/12/2021 CLINICAL DATA:  Pain following fall EXAM: CHEST  1 VIEW COMPARISON:  September 19, 2020 FINDINGS: There is cardiomegaly with pulmonary venous hypertension. The interstitium is mildly prominent without overt pulmonary edema. No appreciable airspace opacity. No adenopathy. No pneumothorax. There is a skin fold on the left. No fracture evident. Postoperative change in lower cervical spine. There is aortic atherosclerosis. IMPRESSION: Suspect underlying chronic bronchitis. No edema or consolidation. There is cardiomegaly with pulmonary vascular congestion. No pneumothorax. Aortic Atherosclerosis (ICD10-I70.0). Electronically Signed   By: Lowella Grip III M.D.   On: 01/12/2021 18:02   DG Pelvis 1-2 Views  Result Date: 01/12/2021  CLINICAL DATA:  Pain following fall EXAM: PELVIS - 1-2  VIEW COMPARISON:  None. FINDINGS: There is no evidence of pelvic fracture or dislocation. Joint spaces appear normal. Multiple foci of vascular calcification noted bilaterally. IMPRESSION: No fracture or dislocation. No evident arthropathy. Multiple foci of vascular calcification noted. Electronically Signed   By: Lowella Grip III M.D.   On: 01/12/2021 18:07   DG Elbow Complete Left  Result Date: 01/12/2021 CLINICAL DATA:  Pain following fall. Reported recent arm region surgery EXAM: LEFT ELBOW - COMPLETE 3+ VIEW COMPARISON:  None. FINDINGS: Frontal, lateral, and bilateral oblique views were obtained. There is soft tissue swelling. There is no appreciable fracture or dislocation. No joint effusion. No joint space narrowing or erosion. Multiple foci of arterial vascular calcification noted. Note that there is soft tissue air in the volar aspect of the distal humerus. IMPRESSION: No fracture or dislocation. Soft tissue swelling and soft tissue air present. A degree of infection within the soft tissues of the distal upper arm region is questioned. Nearby surgical clips. Multiple foci of arterial vascular calcification noted. Electronically Signed   By: Lowella Grip III M.D.   On: 01/12/2021 18:05   DG Forearm Left  Result Date: 01/12/2021 CLINICAL DATA:  Pain following fall.  Recent fistula surgery EXAM: LEFT FOREARM - 2 VIEW COMPARISON:  None. FINDINGS: Frontal and lateral views were obtained. No acute fracture or dislocation. No appreciable joint space narrowing or erosion. Multiple foci of vascular calcification noted. Soft tissue air is noted volar to the distal upper extremity. IMPRESSION: Foci of soft tissue air may be have infectious etiology or may be secondary to recent surgery. Clinical assessment in this regard warranted. No acute fracture or dislocation. No appreciable joint space narrowing or erosion. Multiple foci of arterial vascular calcification noted. Electronically Signed   By:  Lowella Grip III M.D.   On: 01/12/2021 18:06   CT Head Wo Contrast  Result Date: 01/12/2021 CLINICAL DATA:  Fall EXAM: CT HEAD WITHOUT CONTRAST CT CERVICAL SPINE WITHOUT CONTRAST TECHNIQUE: Multidetector CT imaging of the head and cervical spine was performed following the standard protocol without intravenous contrast. Multiplanar CT image reconstructions of the cervical spine were also generated. COMPARISON:  None. FINDINGS: CT HEAD FINDINGS Brain: No evidence of acute infarction, hemorrhage, hydrocephalus, extra-axial collection or mass lesion/mass effect. Periventricular white matter hypodensity. Vascular: No hyperdense vessel or unexpected calcification. Skull: Normal. Negative for fracture or focal lesion. Sinuses/Orbits: No acute finding. Other: None. CT CERVICAL SPINE FINDINGS Alignment: Degenerative straightening and reversal of the normal cervical lordosis. Skull base and vertebrae: No acute fracture. No primary bone lesion or focal pathologic process. Soft tissues and spinal canal: No prevertebral fluid or swelling. No visible canal hematoma. Disc levels: Status post anterior cervical discectomy and fusion of C5 through C7. Upper chest: Negative. Other: None. IMPRESSION: 1. No acute intracranial pathology. Small-vessel white matter disease. 2. No fracture or static subluxation of the cervical spine. 3. Status post anterior cervical discectomy and fusion of C5 through C7. Electronically Signed   By: Eddie Candle M.D.   On: 01/12/2021 17:50   CT Cervical Spine Wo Contrast  Result Date: 01/12/2021 CLINICAL DATA:  Fall EXAM: CT HEAD WITHOUT CONTRAST CT CERVICAL SPINE WITHOUT CONTRAST TECHNIQUE: Multidetector CT imaging of the head and cervical spine was performed following the standard protocol without intravenous contrast. Multiplanar CT image reconstructions of the cervical spine were also generated. COMPARISON:  None. FINDINGS: CT HEAD FINDINGS Brain: No evidence of acute infarction,  hemorrhage, hydrocephalus, extra-axial collection or mass lesion/mass effect. Periventricular white matter hypodensity. Vascular: No hyperdense vessel or unexpected calcification. Skull: Normal. Negative for fracture or focal lesion. Sinuses/Orbits: No acute finding. Other: None. CT CERVICAL SPINE FINDINGS Alignment: Degenerative straightening and reversal of the normal cervical lordosis. Skull base and vertebrae: No acute fracture. No primary bone lesion or focal pathologic process. Soft tissues and spinal canal: No prevertebral fluid or swelling. No visible canal hematoma. Disc levels: Status post anterior cervical discectomy and fusion of C5 through C7. Upper chest: Negative. Other: None. IMPRESSION: 1. No acute intracranial pathology. Small-vessel white matter disease. 2. No fracture or static subluxation of the cervical spine. 3. Status post anterior cervical discectomy and fusion of C5 through C7. Electronically Signed   By: Eddie Candle M.D.   On: 01/12/2021 17:50   PERIPHERAL VASCULAR CATHETERIZATION  Result Date: 01/15/2021 Patient name: Pharaoh Pio MRN: [id number] DOB: 1960/10/06 Sex: male 01/15/2021 Pre-operative Diagnosis: ESRD Post-operative diagnosis:  Same Surgeon:  [name] Procedure Performed:  1.  Ultrasound-guided access, left cephalic vein  2.  Fistulogram  3.  Venoplasty, left subclavian vein  4.  Conscious sedation, 20 minutes Indications: The patient recently underwent conversion of a left radiocephalic fistula to a brachiocephalic fistula.  He has had significant arm swelling since that time.  He is here for fistulogram Procedure:  The patient was identified in the holding area and taken to room 8.  The patient was then placed supine on the table and prepped and draped in the usual sterile fashion.  A time out was called.  Conscious sedation was administered with the use of IV fentanyl and Versed under continuous physician and nurse monitoring.  Heart rate, blood pressure, and  oxygen saturations were continuously monitored.  Total sedation time was 20 minutes ultrasound was used to evaluate the fistula.  The vein was patent and compressible.  A digital ultrasound image was acquired.  The fistula was then accessed under ultrasound guidance using a micropuncture needle.  An 018 wire was then asvanced without resistance and a micropuncture sheath was placed.  Contrast injections were then performed through the sheath. Findings: The visualized portions of the cephalic vein fistula are patent without stenosis.  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  The remaining portion of the central venous system is widely patent.  Intervention: After the above images were acquired the decision made to proceed with intervention.  Over an 035 wire, a 7 French sheath was placed.  I used a 12 x 60 Mustang balloon to perform venoplasty of the left subclavian vein stenosis.  This was done for 30 seconds at 15 atm.  Follow-up imaging revealed resolution of the stenosis.  The wire and balloon were removed.  The sheath was removed and a Monocryl was used for closure Impression:  #1  Approximately 70% left subclavian vein (central vein), successfully treated with a 12 x 60 balloon with no residual stenosis V. [name], M.D., [facility] [place] Office: [phone] Pager:  [phone]  DG CHEST PORT 1 VIEW  Result Date: 02/02/2021 CLINICAL DATA:  S/P hemodialysis catheter insertion EXAM: PORTABLE CHEST - 1 VIEW COMPARISON:  01/13/2021 FINDINGS: Tunneled right IJ hemodialysis catheter extends to the cavoatrial junction. No pneumothorax. Mild cardiomegaly stable.  Aortic Atherosclerosis (ICD10-170.0). Hazy predominately perihilar interstitial opacities suggesting mild edema. Patchy somewhat coarse poorly marginated airspace opacities in the right apex and left retrocardiac region. Blunting of the left lateral costophrenic  angle suggesting small  effusion. Cervical fixation hardware partially visualized. IMPRESSION: 1. Central venous catheter to cavoatrial junction without pneumothorax. Electronically Signed   By: Lucrezia Europe M.D.   On: 02/02/2021 09:43   DG CHEST PORT 1 VIEW  Result Date: 01/14/2021 CLINICAL DATA:  Follow-up EXAM: PORTABLE CHEST - 1 VIEW COMPARISON:  01/12/2021 FINDINGS: The mediastinal contours are within normal limits. Unchanged moderate cardiomegaly. Improved aeration of the lungs bilaterally with mild persistent scattered peribronchovascular patchy opacities. No significant pleural effusion or pneumothorax. Atherosclerotic calcification of the aortic arch. Multilevel cervicothoracic anterior fusion hardware remains in place without complicating feature. No acute osseous abnormality. IMPRESSION: 1. Improved aeration bilaterally compatible with improving mild pulmonary edema. 2. Stable moderate cardiomegaly. 3.  Aortic Atherosclerosis (ICD10-I70.0). Electronically Signed   By: Ruthann Cancer MD   On: 01/14/2021 08:23   DG Fluoro Guide CV Line-No Report  Result Date: 02/02/2021 Fluoroscopy was utilized by the requesting physician.  No radiographic interpretation.   ECHOCARDIOGRAM COMPLETE  Result Date: 01/13/2021    ECHOCARDIOGRAM REPORT   Patient Name:   KOLIN ERDAHL Date of Exam: 01/13/2021 Medical Rec #:  [id number]        Height:       68.0 in Accession #:    [id number]       Weight:       133.6 lb Date of Birth:  [birth year]        BSA:          1.722 m Patient Age:    19 years         BP:           118/73 mmHg Patient Gender: M                HR:           78 bpm. Exam Location:  Inpatient Procedure: 2D Echo, 3D Echo, Cardiac Doppler and Color Doppler Indications:    Syncope R55  History:        Patient has prior history of Echocardiogram examinations, most                 recent 05/17/2019. CHF, Pulmonary HTN; Risk Factors:Hypertension                 and Dyslipidemia. End stage renal disease, Macrocytic anemia.                  GERD. Thoracic ascending aortic aneurysm 4.5 by CT.  Sonographer:    Darlina Sicilian RDCS Referring Phys: [id number] [place]  Sonographer Comments: Global longitudinal strain was attempted. IMPRESSIONS  1. Left ventricular ejection fraction, by estimation, is 60 to 65%. The left ventricle has normal function. The left ventricle has no regional wall motion abnormalities. There is moderate concentric left ventricular hypertrophy. Left ventricular diastolic parameters are consistent with Grade I diastolic dysfunction (impaired relaxation).  2. Right ventricular systolic function is mildly reduced. The right ventricular size is mildly enlarged. There is severely elevated pulmonary artery systolic pressure. The estimated right ventricular systolic pressure is 06.3 mmHg.  3. There is a trivial pericardial effusion posterior to the left ventricle.  4. The mitral valve is abnormal. Mild mitral valve regurgitation. Moderate mitral annular calcification.  5. Tricuspid valve regurgitation is moderate.  6. The aortic valve is tricuspid. There is moderate calcification of the aortic valve. Aortic valve regurgitation is mild. Mild to moderate aortic valve stenosis. Aortic valve mean gradient measures 12.0 mmHg. Dimentionless index 0.58.  7. Aortic dilatation noted. There is mild dilatation of the ascending aorta, measuring 41 mm.  8. The inferior vena cava is dilated in size with <50% respiratory variability, suggesting right atrial pressure of 15 mmHg. FINDINGS  Left Ventricle: Left ventricular ejection fraction, by estimation, is 60 to 65%. The left ventricle has normal function. The left ventricle has no regional wall motion abnormalities. The left ventricular internal cavity size was small. There is moderate  concentric left ventricular hypertrophy. Left ventricular diastolic parameters are consistent with Grade I diastolic dysfunction (impaired relaxation). Right Ventricle: The right ventricular size is mildly  enlarged. Right vetricular wall thickness was not assessed. Right ventricular systolic function is mildly reduced. There is severely elevated pulmonary artery systolic pressure. The tricuspid regurgitant velocity is 3.87 m/s, and with an assumed right atrial pressure of 15 mmHg, the estimated right ventricular systolic pressure is 95.2 mmHg. Left Atrium: Left atrial size was normal in size. Right Atrium: Right atrial size was normal in size. Pericardium: Trivial pericardial effusion is present. The pericardial effusion is posterior to the left ventricle. Mitral Valve: The mitral valve is abnormal. There is mild calcification of the mitral valve leaflet(s). Moderate mitral annular calcification. Mild mitral valve regurgitation. Tricuspid Valve: The tricuspid valve is grossly normal. Tricuspid valve regurgitation is moderate. Aortic Valve: The aortic valve is tricuspid. There is moderate calcification of the aortic valve. There is mild aortic valve annular calcification. Aortic valve regurgitation is mild. Aortic regurgitation PHT measures 325 msec. Mild to moderate aortic stenosis is present. Aortic valve mean gradient measures 12.0 mmHg. Aortic valve peak gradient measures 23.2 mmHg. Aortic valve area, by VTI measures 1.46 cm. Pulmonic Valve: The pulmonic valve was grossly normal. Pulmonic valve regurgitation is trivial. Aorta: The aortic root is normal in size and structure and aortic dilatation noted. There is mild dilatation of the ascending aorta, measuring 41 mm. Venous: The inferior vena cava is dilated in size with less than 50% respiratory variability, suggesting right atrial pressure of 15 mmHg. IAS/Shunts: No atrial level shunt detected by color flow Doppler.  LEFT VENTRICLE PLAX 2D LVIDd:         3.80 cm     Diastology LVIDs:         2.40 cm     LV e' medial:    3.44 cm/s LV PW:         1.00 cm     LV E/e' medial:  25.4 LV IVS:        1.10 cm     LV e' lateral:   4.26 cm/s LVOT diam:     1.80 cm     LV  E/e' lateral: 20.5 LV SV:         54 LV SV Index:   32 LVOT Area:     2.54 cm  LV Volumes (MOD) LV vol d, MOD A2C: 74.7 ml LV vol d, MOD A4C: 67.4 ml LV vol s, MOD A2C: 43.2 ml LV vol s, MOD A4C: 33.1 ml LV SV MOD A2C:     31.5 ml LV SV MOD A4C:     67.4 ml LV SV MOD BP:      34.2 ml RIGHT VENTRICLE RV S prime:     14.90 cm/s TAPSE (M-mode): 2.0 cm LEFT ATRIUM             Index       RIGHT ATRIUM           Index LA diam:  2.80 cm 1.63 cm/m  RA Area:     18.70 cm LA Vol (A2C):   66.2 ml 38.45 ml/m RA Volume:   52.80 ml  30.67 ml/m LA Vol (A4C):   40.7 ml 23.64 ml/m LA Biplane Vol: 57.9 ml 33.63 ml/m  AORTIC VALVE AV Area (Vmax):    1.26 cm AV Area (Vmean):   1.31 cm AV Area (VTI):     1.46 cm AV Vmax:           241.00 cm/s AV Vmean:          156.000 cm/s AV VTI:            0.372 m AV Peak Grad:      23.2 mmHg AV Mean Grad:      12.0 mmHg LVOT Vmax:         119.00 cm/s LVOT Vmean:        80.300 cm/s LVOT VTI:          0.214 m LVOT/AV VTI ratio: 0.58 AI PHT:            325 msec  AORTA Ao Root diam: 3.70 cm Ao Asc diam:  4.10 cm MITRAL VALVE                TRICUSPID VALVE MV Area (PHT): 1.66 cm     TR Peak grad:   59.9 mmHg MV Decel Time: 458 msec     TR Vmax:        387.00 cm/s MV E velocity: 87.40 cm/s MV A velocity: 113.00 cm/s  SHUNTS MV E/A ratio:  0.77         Systemic VTI:  0.21 m                             Systemic Diam: 1.80 cm Rozann Lesches MD Electronically signed by Rozann Lesches MD Signature Date/Time: 01/13/2021/2:53:42 PM    Final    VAS [place] UPPER EXTREMITY VENOUS DUPLEX  Result Date: 01/13/2021 UPPER VENOUS STUDY  Patient Name:  HERB BELTRE  Date of Exam:   01/13/2021 Medical Rec #: [id number]         Accession #:    [id number] Date of Birth: 06-Oct-1960         Patient Gender: M Patient Age:   59Y Exam Location:  [facility] Procedure:      VAS [place] UPPER EXTREMITY VENOUS DUPLEX Referring Phys: [id number] JARED M GARDNER  --------------------------------------------------------------------------------  Indications: Pain, and Swelling Risk Factors: Surgery - Recent ligation of radiocephalic AVF and creation of brachiocephalic AVF Recent fall with skin tear to LUE. Comparison Study: Previous 07/03/20 - negative Performing Technologist: Rogelia Rohrer  Examination Guidelines: A complete evaluation includes B-mode imaging, spectral Doppler, color Doppler, and power Doppler as needed of all accessible portions of each vessel. Bilateral testing is considered an integral part of a complete examination. Limited examinations for reoccurring indications may be performed as noted.  Right Findings: +----------+------------+---------+-----------+----------+-------+ RIGHT     CompressiblePhasicitySpontaneousPropertiesSummary +----------+------------+---------+-----------+----------+-------+ Subclavian    Full       Yes       Yes                      +----------+------------+---------+-----------+----------+-------+  Left Findings: +----------+------------+---------+-----------+----------+-------+ LEFT      CompressiblePhasicitySpontaneousPropertiesSummary +----------+------------+---------+-----------+----------+-------+ IJV           Full       Yes  Yes                      +----------+------------+---------+-----------+----------+-------+ Subclavian    Full       Yes       Yes                      +----------+------------+---------+-----------+----------+-------+ Axillary      Full       Yes       Yes                      +----------+------------+---------+-----------+----------+-------+ Brachial      Full       Yes       Yes                      +----------+------------+---------+-----------+----------+-------+ Radial        Full                                          +----------+------------+---------+-----------+----------+-------+ Ulnar         Full                                           +----------+------------+---------+-----------+----------+-------+ Cephalic      Full                                          +----------+------------+---------+-----------+----------+-------+ Basilic       Full       Yes       Yes                      +----------+------------+---------+-----------+----------+-------+ Patient negative for DVT however there is concern for disfunction of AVF versus arterial disease. Hand is cold to the touch with decreased arterial flow in forearm. would recommend vascular consult and further imaging.  Summary:  Right: No evidence of thrombosis in the subclavian.  Left: No evidence of deep vein thrombosis in the upper extremity. No evidence of superficial vein thrombosis in the upper extremity.  *See table(s) above for measurements and observations.  Vascular consult recommended. Diagnosing physician: Servando Snare MD Electronically signed by Servando Snare MD on 01/13/2021 at 12:06:47 PM.    Final     Micro Results     Recent Results (from the past 240 hour(s))  Culture, blood (Routine X 2) w Reflex to ID Panel     Status: None (Preliminary result)   Collection Time: 02/01/21  1:00 PM   Specimen: BLOOD  Result Value Ref Range Status   Specimen Description BLOOD RIGHT ANTECUBITAL  Final   Special Requests   Final    BOTTLES DRAWN AEROBIC AND ANAEROBIC Blood Culture results may not be optimal due to an inadequate volume of blood received in culture bottles   Culture   Final    NO GROWTH 3 DAYS Performed at [facility], [place] [address], [place], [place] [address]    Report Status PENDING  Incomplete  Culture, blood (Routine X 2) w Reflex to ID Panel     Status: None (Preliminary result)   Collection Time: 02/01/21  1:07 PM   Specimen: BLOOD RIGHT FOREARM  Result Value Ref Range Status   Specimen Description BLOOD RIGHT FOREARM  Final   Special Requests   Final    BOTTLES DRAWN AEROBIC AND ANAEROBIC Blood Culture results may not be  optimal due to an inadequate volume of blood received in culture bottles   Culture   Final    NO GROWTH 3 DAYS Performed at [facility], [place] [address], [place], [place] [address]    Report Status PENDING  Incomplete  Resp Panel by RT-PCR (Flu A&B, Covid) Nasopharyngeal Swab     Status: None   Collection Time: 02/01/21  2:30 PM   Specimen: Nasopharyngeal Swab; Nasopharyngeal(NP) swabs in vial transport medium  Result Value Ref Range Status   SARS Coronavirus 2 by RT PCR NEGATIVE NEGATIVE Final    Comment: (NOTE) SARS-CoV-2 target nucleic acids are NOT DETECTED.  The SARS-CoV-2 RNA is generally detectable in upper respiratory specimens during the acute phase of infection. The lowest concentration of SARS-CoV-2 viral copies this assay can detect is 138 copies/mL. A negative result does not preclude SARS-Cov-2 infection and should not be used as the sole basis for treatment or other patient management decisions. A negative result may occur with  improper specimen collection/handling, submission of specimen other than nasopharyngeal swab, presence of viral mutation(s) within the areas targeted by this assay, and inadequate number of viral copies(<138 copies/mL). A negative result must be combined with clinical observations, patient history, and epidemiological information. The expected result is Negative.  Fact Sheet for Patients:  EntrepreneurPulse.com.au  Fact Sheet for Healthcare Providers:  IncredibleEmployment.be  This test is no t yet approved or cleared by the [place] FDA and  has been authorized for detection and/or diagnosis of SARS-CoV-2 by FDA under an Emergency Use Authorization (EUA). This EUA will remain  in effect (meaning this test can be used) for the duration of the COVID-19 declaration under Section 564(b)(1) of the Act, 21 U.S.C.section 360bbb-3(b)(1), unless the authorization is terminated  or revoked sooner.        Influenza A by PCR NEGATIVE NEGATIVE Final   Influenza B by PCR NEGATIVE NEGATIVE Final    Comment: (NOTE) The Xpert Xpress SARS-CoV-2/FLU/RSV plus assay is intended as an aid in the diagnosis of influenza from Nasopharyngeal swab specimens and should not be used as a sole basis for treatment. Nasal washings and aspirates are unacceptable for Xpert Xpress SARS-CoV-2/FLU/RSV testing.  Fact Sheet for Patients: EntrepreneurPulse.com.au  Fact Sheet for Healthcare Providers: IncredibleEmployment.be  This test is not yet approved or cleared by the [place] FDA and has been authorized for detection and/or diagnosis of SARS-CoV-2 by FDA under an Emergency Use Authorization (EUA). This EUA will remain in effect (meaning this test can be used) for the duration of the COVID-19 declaration under Section 564(b)(1) of the Act, 21 U.S.C. section 360bbb-3(b)(1), unless the authorization is terminated or revoked.  Performed at [facility], [place] [address] [place], [place] [address]   Surgical pcr screen     Status: None   Collection Time: 02/02/21 12:27 AM   Specimen: Nasal Mucosa; Nasal Swab  Result Value Ref Range Status   MRSA, PCR NEGATIVE NEGATIVE Final   Staphylococcus aureus NEGATIVE NEGATIVE Final    Comment: (NOTE) The Xpert SA Assay (FDA approved for NASAL specimens in patients 59 years of age and older), is one component of a comprehensive surveillance program. It is not intended to diagnose infection nor to  guide or monitor treatment. Performed at [facility], [place] [address], [place], [place] [address]   Aerobic Culture w Gram Stain (superficial specimen)     Status: None (Preliminary result)   Collection Time: 02/02/21  8:58 AM   Specimen: Wound  Result Value Ref Range Status   Specimen Description WOUND  Final   Special Requests LEFT FOREARM WD SPEC A  Final   Gram Stain NO WBC SEEN NO ORGANISMS SEEN   Final    Culture   Final    CULTURE REINCUBATED FOR BETTER GROWTH Performed at [facility], [place] [address] [place], [place] [address]    Report Status PENDING  Incomplete    Today   Subjective    Thomas Robinson today has no headache,no chest abdominal pain,no new weakness tingling or numbness, feels much better wants to go home today.    Objective   Blood pressure (!) 115/45, pulse 61, temperature 98.9 F (37.2 C), temperature source Oral, resp. rate 18, height 5' 8"  (1.727 m), weight 56.5 kg, SpO2 94 %.   Intake/Output Summary (Last 24 hours) at 02/04/2021 0927 Last data filed at 02/04/2021 0800 Gross per 24 hour  Intake 340 ml  Output 2600 ml  Net -2260 ml    Exam  Awake Alert, No new F.N deficits, Normal affect [place].AT,PERRAL Supple Neck,No JVD, No cervical lymphadenopathy appriciated.  Symmetrical Chest wall movement, Good air movement bilaterally, CTAB RRR,No Gallops,Rubs or new Murmurs, No Parasternal Heave +ve B.Sounds, Abd Soft, Non tender, No organomegaly appriciated, No rebound -guarding or rigidity. No Cyanosis, L. Arm under bandage   Data Review   CBC w Diff:  Lab Results  Component Value Date   WBC 4.4 02/04/2021   HGB 8.2 (L) 02/04/2021   HCT 25.5 (L) 02/04/2021   HCT 27.3 (L) 05/17/2019   PLT 196 02/04/2021   LYMPHOPCT 10 02/04/2021   MONOPCT 7 02/04/2021   EOSPCT 4 02/04/2021   BASOPCT 1 02/04/2021    CMP:  Lab Results  Component Value Date   NA 134 (L) 02/04/2021   NA 141 03/21/2020   K 4.1 02/04/2021   CL 94 (L) 02/04/2021   CO2 28 02/04/2021   BUN 22 02/04/2021   BUN 53 (H) 03/21/2020   CREATININE 5.31 (H) 02/04/2021   PROT 6.4 (L) 01/12/2021   ALBUMIN 2.8 (L) 02/02/2021   BILITOT 1.4 (H) 01/12/2021   ALKPHOS 71 01/12/2021   AST 19 01/12/2021   ALT <5 01/12/2021  .   Total Time in preparing paper work, data evaluation and todays exam - 51 minutes  Lala Lund M.D on 02/04/2021 at 9:27 AM  [facility]

## 2021-02-04 NOTE — Discharge Instructions (Signed)
Follow with Primary MD [name], PA in 7 days   Get CBC, CMP, 2 view Chest X ray -  checked next visit within 1 week by Primary MD    Activity: As tolerated with Full fall precautions use walker/cane & assistance as needed  Disposition Home   Diet: Renal-low carbohydrate diet with 1200 cc of fluid restriction per day  Special Instructions: If you have smoked or chewed Tobacco  in the last 2 yrs please stop smoking, stop any regular Alcohol  and or any Recreational drug use.  On your next visit with your primary care physician please Get Medicines reviewed and adjusted.  Please request your Prim.MD to go over all Hospital Tests and Procedure/Radiological results at the follow up, please get all Hospital records sent to your Prim MD by signing hospital release before you go home.  If you experience worsening of your admission symptoms, develop shortness of breath, life threatening emergency, suicidal or homicidal thoughts you must seek medical attention immediately by calling 911 or calling your MD immediately  if symptoms less severe.  You Must read complete instructions/literature along with all the possible adverse reactions/side effects for all the Medicines you take and that have been prescribed to you. Take any new Medicines after you have completely understood and accpet all the possible adverse reactions/side effects.

## 2021-02-04 NOTE — Plan of Care (Signed)
  Problem: Clinical Measurements: Goal: Respiratory complications will improve Outcome: Completed/Met   Problem: Pain Managment: Goal: General experience of comfort will improve Outcome: Completed/Met   Problem: Safety: Goal: Ability to remain free from injury will improve Outcome: Completed/Met   Problem: Skin Integrity: Goal: Risk for impaired skin integrity will decrease Outcome: Completed/Met

## 2021-02-04 NOTE — Evaluation (Signed)
Occupational Therapy Evaluation and Discharge Patient Details Name: Thomas Robinson MRN: [id number] DOB: February 11, 1961 Today's Date: 02/04/2021    History of Present Illness 60 y.o. male presents to [facility] ED on 02/01/2021 after failed dialysis session due to LUE wound. Pt had undergone recent brachiocephalic AV fistula placement and ligation of left radiocephalic fistula with angioplasty of his left subclavian vein on Jan 15 2021. This admission, pt underwent AV fistula ligation, I&D of L forearm and catheter placement on 02/02/2021. PMH includes HTN, chronic diastolic heart failure , and end-stage renal disease.   Clinical Impression   This 60 yo male admitted with above presents to acute OT at a Mod I level and will have A prn at home per his report. No further OT needs, we will sign off.    Follow Up Recommendations  No OT follow up;Supervision - Intermittent    Equipment Recommendations  None recommended by OT       Precautions / Restrictions Precautions Precautions: Fall Restrictions Other Position/Activity Restrictions: No WB'ing restrictions in orders or progress notes; however made pt aware best practice would be to not Push,Pull, or Lift with LUE; but could use functionally for baisc self care.      Mobility Bed Mobility Overal bed mobility: Modified Independent                  Transfers Overall transfer level: Modified independent Equipment used: None             General transfer comment: walks a bit stiff legged due to bad knee--reports needs surgery    Balance Overall balance assessment: Mild deficits observed, not formally tested (in standing and ambulating)                                         ADL either performed or assessed with clinical judgement   ADL Overall ADL's : Modified independent (due to decreased use of LUE, but can problem solve well to get tasks completed.)                                       General  ADL Comments: Pt made aware when getting dressed to put LUE in first, then head, the RUE and reverse process to take shirt off (t-shirt)     Vision Patient Visual Report: No change from baseline              Pertinent Vitals/Pain Pain Assessment: No/denies pain     Hand Dominance Right   Extremity/Trunk Assessment Upper Extremity Assessment Upper Extremity Assessment: LUE deficits/detail LUE Deficits / Details: ace wrapped from hand to right above elbow--loosely wrapped. Pt's hand mildly edematous--can move all digits. Also had pt move his shoulder (he can get to ~90 degrees without issues then reports arm is tight). Encouraged him move arm/hand throughout the day and to keep arm propped up with he is at rest. LUE Coordination: decreased fine motor;decreased gross motor           Communication Communication Communication: No difficulties   Cognition Arousal/Alertness: Awake/alert Behavior During Therapy: WFL for tasks assessed/performed Overall Cognitive Status: Within Functional Limits for tasks assessed  General Comments: It did appear twice that he was using his LUE to push/support with (getting up from EOB and getting back into bed), pt reports he was not actually putting weight through arm.              Home Living Family/patient expects to be discharged to:: Private residence Living Arrangements: Non-relatives/Friends Available Help at Discharge: Friend(s);Available 24 hours/day Type of Home: House Home Access: Stairs to enter [employer] of Steps: 2 Entrance Stairs-Rails: None Home Layout: One level     Bathroom Shower/Tub: Tub/shower unit (pt reports he will just sponge bath due to LUE and right upper chest port.)   Bathroom Toilet: Standard     Home Equipment: Walker - 4 wheels;Cane - single point          Prior Functioning/Environment Level of Independence: Independent        Comments:  pt with recent fall        OT Problem List: Decreased strength;Decreased range of motion;Impaired balance (sitting and/or standing);Impaired UE functional use         OT Goals(Current goals can be found in the care plan section) Acute Rehab OT Goals Patient Stated Goal: to go home today                AM-PAC OT "6 Clicks" Daily Activity     Outcome Measure Help from another person eating meals?: None Help from another person taking care of personal grooming?: None Help from another person toileting, which includes using toliet, bedpan, or urinal?: None Help from another person bathing (including washing, rinsing, drying)?: None Help from another person to put on and taking off regular upper body clothing?: None Help from another person to put on and taking off regular lower body clothing?: None 6 Click Score: 24   End of Session Equipment Utilized During Treatment:  (none) Nurse Communication:  (pt reports he lives with a friend)  Activity Tolerance: Patient tolerated treatment well Patient left: in bed;with call bell/phone within reach;with bed alarm set  OT Visit Diagnosis: Unsteadiness on feet (R26.81);Muscle weakness (generalized) (M62.81)                Time: 1950-9326 OT Time Calculation (min): 11 min Charges:  OT General Charges $OT Visit: 1 Visit OT Evaluation $OT Eval Low Complexity: 1 Low  [address] [address], OTR/L Acute [employer] Pager [phone] Office [phone]     Almon Register 02/04/2021, 10:13 AM

## 2021-02-04 NOTE — Progress Notes (Signed)
DISCHARGE NOTE HOME Thomas Robinson to be discharged Home per MD order. Discussed prescriptions and follow up appointments with the patient. Prescriptions given to patient; medication list explained in detail. Patient verbalized understanding.  Skin clean, dry and intact without evidence of skin break down, no evidence of skin tears noted. IV catheter discontinued intact. Site without signs and symptoms of complications. Dressing and pressure applied. Pt denies pain at the site currently. No complaints noted.  Patient free of lines and drains.   An After Visit Summary (AVS) was printed and given to the patient. Patient escorted via wheelchair, and discharged home via private auto.  Vira Agar, RN

## 2021-02-05 LAB — TYPE AND SCREEN
ABO/RH(D): A POS
Antibody Screen: NEGATIVE
Unit division: 0
Unit division: 0

## 2021-02-05 LAB — BPAM RBC
Blood Product Expiration Date: 202206262359
Blood Product Expiration Date: 202206262359
Unit Type and Rh: 6200
Unit Type and Rh: 6200

## 2021-02-06 LAB — CULTURE, BLOOD (ROUTINE X 2)
Culture: NO GROWTH
Culture: NO GROWTH

## 2021-02-07 ENCOUNTER — Other Ambulatory Visit: Payer: Self-pay

## 2021-02-07 DIAGNOSIS — N186 End stage renal disease: Secondary | ICD-10-CM

## 2021-02-07 NOTE — Addendum Note (Signed)
Addended byDoylene Bode on: 02/07/2021 05:16 PM   Modules accepted: Orders

## 2021-02-14 ENCOUNTER — Encounter (HOSPITAL_COMMUNITY): Payer: Medicare Other

## 2021-02-14 ENCOUNTER — Telehealth: Payer: Self-pay

## 2021-02-14 ENCOUNTER — Emergency Department (HOSPITAL_COMMUNITY)
Admission: EM | Admit: 2021-02-14 | Discharge: 2021-02-15 | Discharge status: Against Medical Advice | Payer: Medicare Other | Source: Home / Self Care

## 2021-02-14 ENCOUNTER — Emergency Department (HOSPITAL_COMMUNITY): Payer: Medicare Other

## 2021-02-14 DIAGNOSIS — I712 Thoracic aortic aneurysm, without rupture: Secondary | ICD-10-CM | POA: Diagnosis not present

## 2021-02-14 DIAGNOSIS — Z9115 Patient's noncompliance with renal dialysis: Secondary | ICD-10-CM | POA: Diagnosis not present

## 2021-02-14 DIAGNOSIS — G2581 Restless legs syndrome: Secondary | ICD-10-CM | POA: Diagnosis not present

## 2021-02-14 DIAGNOSIS — N186 End stage renal disease: Secondary | ICD-10-CM | POA: Diagnosis not present

## 2021-02-14 DIAGNOSIS — Z981 Arthrodesis status: Secondary | ICD-10-CM | POA: Diagnosis not present

## 2021-02-14 DIAGNOSIS — M549 Dorsalgia, unspecified: Secondary | ICD-10-CM | POA: Insufficient documentation

## 2021-02-14 DIAGNOSIS — Z87892 Personal history of anaphylaxis: Secondary | ICD-10-CM | POA: Diagnosis not present

## 2021-02-14 DIAGNOSIS — M25551 Pain in right hip: Secondary | ICD-10-CM | POA: Insufficient documentation

## 2021-02-14 DIAGNOSIS — E875 Hyperkalemia: Secondary | ICD-10-CM | POA: Diagnosis present

## 2021-02-14 DIAGNOSIS — I132 Hypertensive heart and chronic kidney disease with heart failure and with stage 5 chronic kidney disease, or end stage renal disease: Secondary | ICD-10-CM | POA: Diagnosis not present

## 2021-02-14 DIAGNOSIS — Z5329 Procedure and treatment not carried out because of patient's decision for other reasons: Secondary | ICD-10-CM | POA: Diagnosis not present

## 2021-02-14 DIAGNOSIS — D631 Anemia in chronic kidney disease: Secondary | ICD-10-CM | POA: Diagnosis not present

## 2021-02-14 DIAGNOSIS — I5033 Acute on chronic diastolic (congestive) heart failure: Secondary | ICD-10-CM | POA: Diagnosis not present

## 2021-02-14 DIAGNOSIS — J9601 Acute respiratory failure with hypoxia: Secondary | ICD-10-CM | POA: Diagnosis not present

## 2021-02-14 DIAGNOSIS — Z20822 Contact with and (suspected) exposure to covid-19: Secondary | ICD-10-CM | POA: Diagnosis not present

## 2021-02-14 DIAGNOSIS — Z8673 Personal history of transient ischemic attack (TIA), and cerebral infarction without residual deficits: Secondary | ICD-10-CM | POA: Diagnosis not present

## 2021-02-14 DIAGNOSIS — Z5321 Procedure and treatment not carried out due to patient leaving prior to being seen by health care provider: Secondary | ICD-10-CM | POA: Insufficient documentation

## 2021-02-14 DIAGNOSIS — N2581 Secondary hyperparathyroidism of renal origin: Secondary | ICD-10-CM | POA: Diagnosis not present

## 2021-02-14 DIAGNOSIS — K219 Gastro-esophageal reflux disease without esophagitis: Secondary | ICD-10-CM | POA: Diagnosis not present

## 2021-02-14 DIAGNOSIS — R079 Chest pain, unspecified: Secondary | ICD-10-CM | POA: Insufficient documentation

## 2021-02-14 DIAGNOSIS — Z992 Dependence on renal dialysis: Secondary | ICD-10-CM | POA: Diagnosis not present

## 2021-02-14 DIAGNOSIS — R0602 Shortness of breath: Secondary | ICD-10-CM | POA: Insufficient documentation

## 2021-02-14 DIAGNOSIS — M25552 Pain in left hip: Secondary | ICD-10-CM | POA: Insufficient documentation

## 2021-02-14 LAB — TROPONIN I (HIGH SENSITIVITY): Troponin I (High Sensitivity): 51 ng/L — ABNORMAL HIGH (ref <18)

## 2021-02-14 LAB — CBC WITH DIFFERENTIAL/PLATELET
Abs Immature Granulocytes: 0.02 10*3/uL (ref 0.00–0.07)
Basophils Absolute: 0.1 10*3/uL (ref 0.0–0.1)
Basophils Relative: 1 %
Eosinophils Absolute: 0.3 10*3/uL (ref 0.0–0.5)
Eosinophils Relative: 5 %
HCT: 26.4 % — ABNORMAL LOW (ref 39.0–52.0)
Hemoglobin: 8.4 g/dL — ABNORMAL LOW (ref 13.0–17.0)
Immature Granulocytes: 0 %
Lymphocytes Relative: 8 %
Lymphs Abs: 0.5 10*3/uL — ABNORMAL LOW (ref 0.7–4.0)
MCH: 32.2 pg (ref 26.0–34.0)
MCHC: 31.8 g/dL (ref 30.0–36.0)
MCV: 101.1 fL — ABNORMAL HIGH (ref 80.0–100.0)
Monocytes Absolute: 0.3 10*3/uL (ref 0.1–1.0)
Monocytes Relative: 5 %
Neutro Abs: 4.6 10*3/uL (ref 1.7–7.7)
Neutrophils Relative %: 81 %
Platelets: 197 10*3/uL (ref 150–400)
RBC: 2.61 MIL/uL — ABNORMAL LOW (ref 4.22–5.81)
RDW: 14 % (ref 11.5–15.5)
WBC: 5.8 10*3/uL (ref 4.0–10.5)
nRBC: 0 % (ref 0.0–0.2)

## 2021-02-14 LAB — I-STAT CHEM 8, ED
BUN: 58 mg/dL — ABNORMAL HIGH (ref 8–23)
Calcium, Ion: 1.17 mmol/L (ref 1.15–1.40)
Chloride: 104 mmol/L (ref 98–111)
Creatinine, Ser: 9.2 mg/dL — ABNORMAL HIGH (ref 0.61–1.24)
Glucose, Bld: 90 mg/dL (ref 70–99)
HCT: 24 % — ABNORMAL LOW (ref 39.0–52.0)
Hemoglobin: 8.2 g/dL — ABNORMAL LOW (ref 13.0–17.0)
Potassium: 5.8 mmol/L — ABNORMAL HIGH (ref 3.5–5.1)
Sodium: 137 mmol/L (ref 135–145)
TCO2: 25 mmol/L (ref 22–32)

## 2021-02-14 LAB — BASIC METABOLIC PANEL
Anion gap: 15 (ref 5–15)
BUN: 61 mg/dL — ABNORMAL HIGH (ref 8–23)
CO2: 24 mmol/L (ref 22–32)
Calcium: 9.8 mg/dL (ref 8.9–10.3)
Chloride: 99 mmol/L (ref 98–111)
Creatinine, Ser: 8.56 mg/dL — ABNORMAL HIGH (ref 0.61–1.24)
GFR, Estimated: 7 mL/min — ABNORMAL LOW (ref >60)
Glucose, Bld: 95 mg/dL (ref 70–99)
Potassium: 5.8 mmol/L — ABNORMAL HIGH (ref 3.5–5.1)
Sodium: 138 mmol/L (ref 135–145)

## 2021-02-14 IMAGING — CR DG CHEST 2V
2 series · 2 of 2 positions shown · non-contrast
Comparison: 06/06/2020

CLINICAL DATA: Chest pain.  Cough.

EXAM:
CHEST - 2 VIEW

[chest pa]
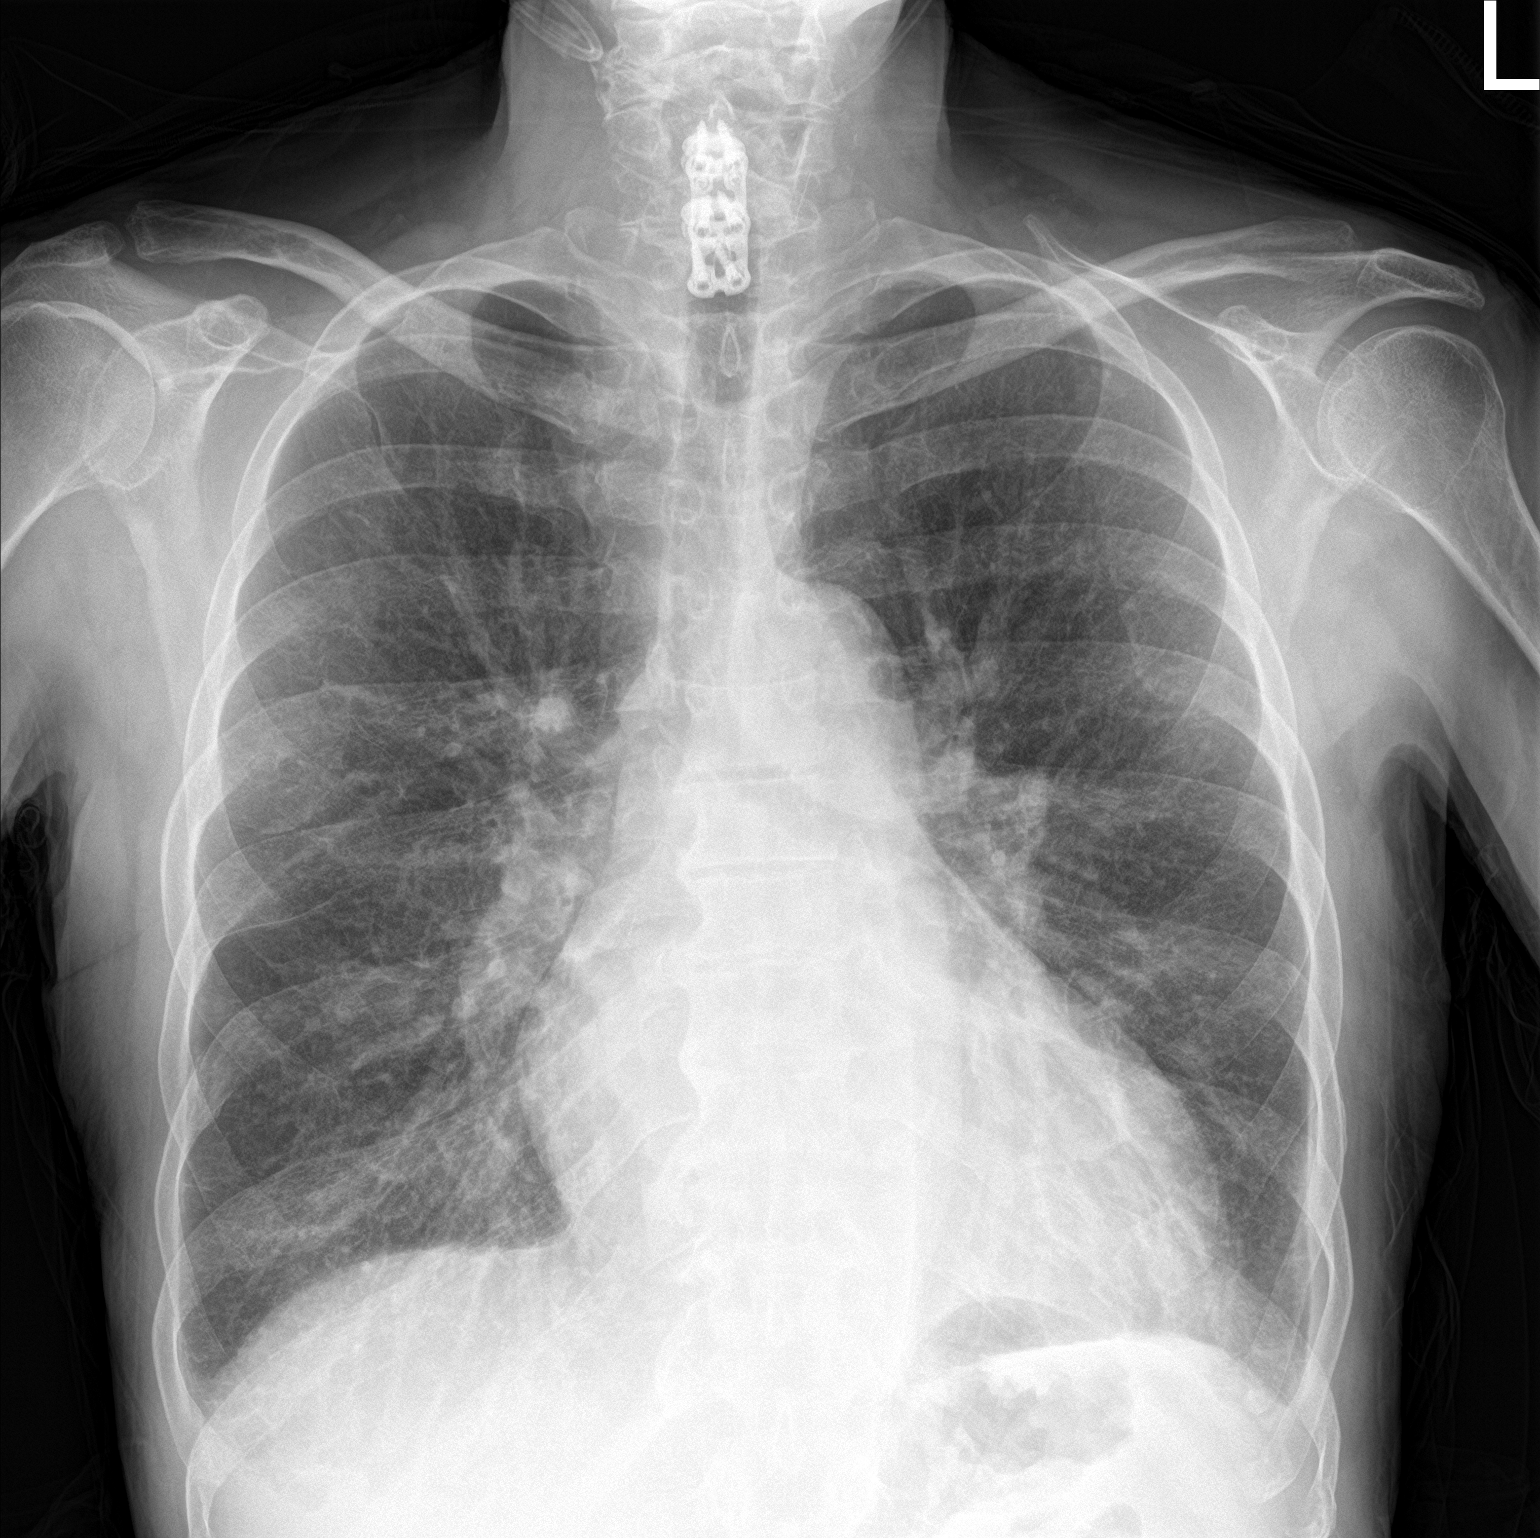

[chest lat]
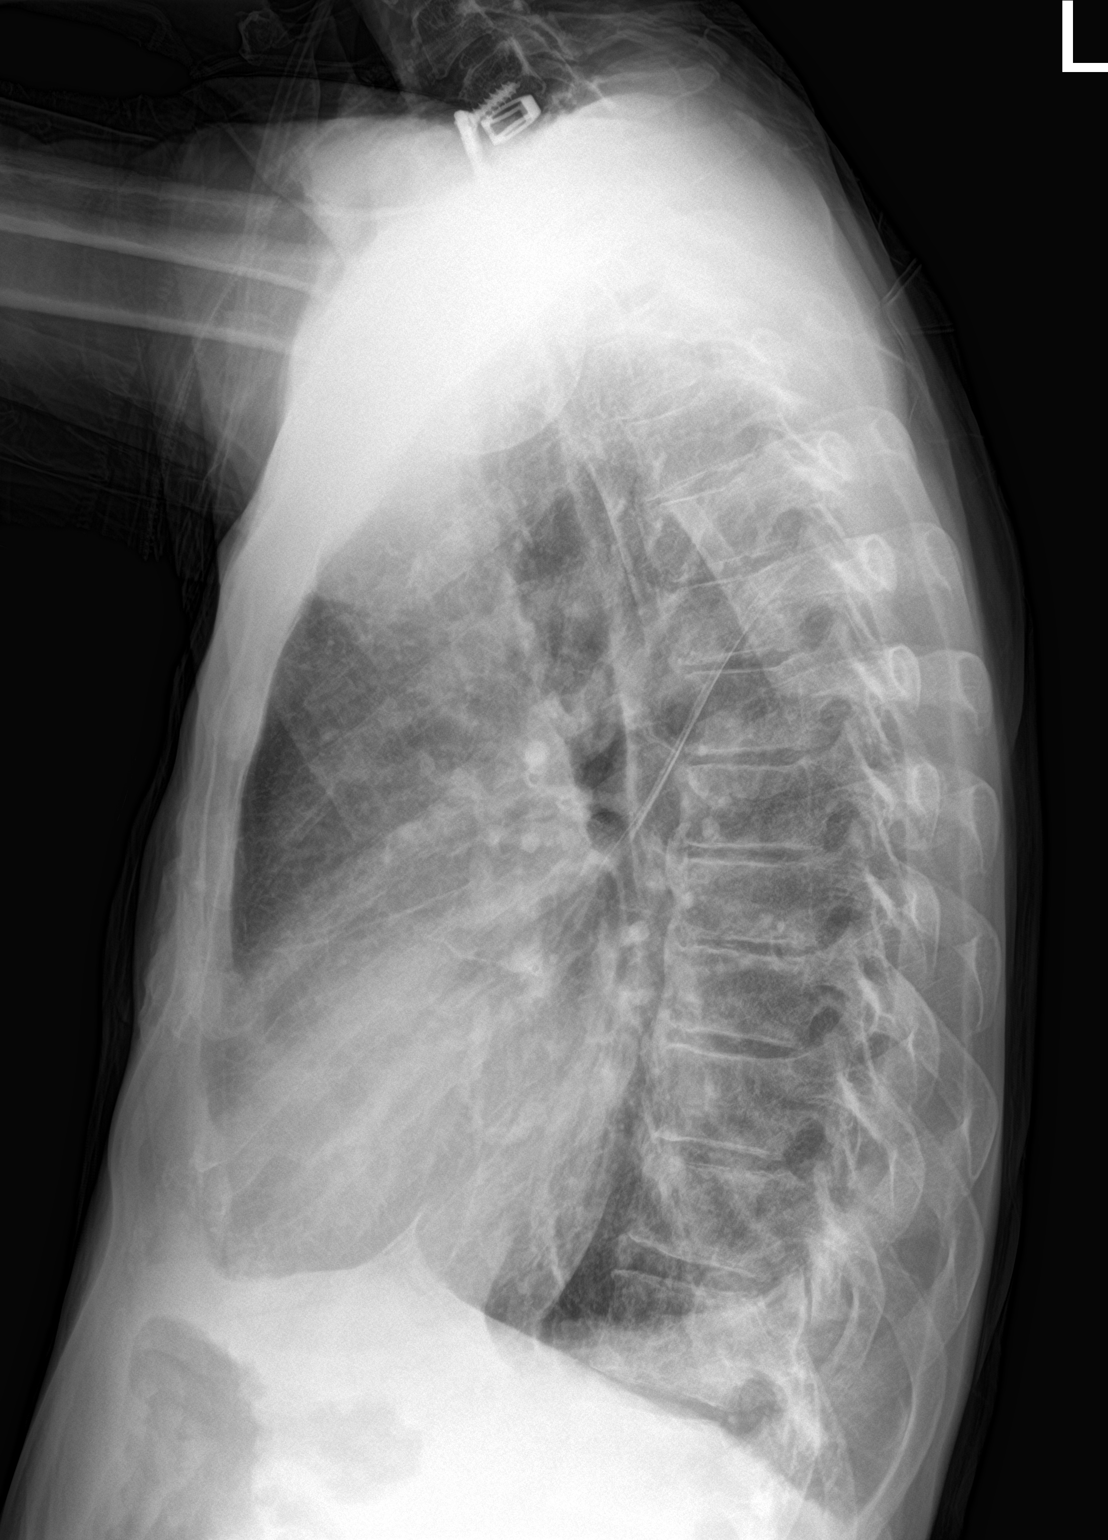

[2 of 2 positions shown; findings below may reference images not displayed]

FINDINGS: The cardiac silhouette remains mildly enlarged. The lungs are well
inflated with a similar appearance of pulmonary vascular congestion
and mild bilateral interstitial opacities. There is a persistent
small left pleural effusion. Prior ACDF is noted.
IMPRESSION: Unchanged mild pulmonary edema and small left pleural effusion.

## 2021-02-14 NOTE — Telephone Encounter (Signed)
[facility] RN called for patient to request pain medicine. He is having some discomfort in the arm and in his back. Says the incision looks fine, he can move his hands and fingers, just a little swollen. Advised we were unable to give pain medicine for procedure on 02/02/21. Call PCP to follow up on back pain- will keep appt on 02/18/21.

## 2021-02-14 NOTE — ED Notes (Signed)
Pt has abnormal labs, called pt and left him a voicemail to return to the ED and he would go to the next available room

## 2021-02-14 NOTE — ED Provider Notes (Addendum)
Emergency Medicine Provider Triage Evaluation Note  Thomas Robinson , a 60 y.o. male  was evaluated in triage.  Pt complains of chest pain, SOB, and pain in the hips.  He is usually MWF dialysis, missed yesterday because they did not have any open slots. Last full treatment Monday. Today called EMS because his back was hurting but found to be satting in 70's on RA.  He is generally not O2 dependent.  Does admit to feeling better with O2 on.    Review of Systems  Positive: Chest pain, SOB, hip pain Negative: Abdominal pain, vomiting  Physical Exam  BP (!) 185/95   Pulse 84   Temp 98.5 F (36.9 C) (Oral)   Resp 18   SpO2 97%  Gen:   Awake, no distress   Resp:  Normal effort, requiring 4L O2, some rales noted MSK:   Moves extremities without difficulty   Medical Decision Making  Medically screening exam initiated at 10:03 PM.  Appropriate orders placed.  [name] was informed that the remainder of the evaluation will be completed by another provider, this initial triage assessment does not replace that evaluation, and the importance of remaining in the ED until their evaluation is complete.  During triage, patient expressed that he would like to leave and initially refused labs.  I have strongly advised again this as he was 70% on RA with EMS arrival.   He states hat he has a dialysis appt at 6am but honestly, may need it more emergently.  After speaking with him, he remains reluctant but did agree to labs.  He was made aware that if chooses to leave, his condition may worsen and he could potentially die.  He expressed understanding of this.  Patient made acuity 2, will prioritize room assignment.   Larene Pickett, PA-C 02/14/21 2223    Larene Pickett, PA-C 02/14/21 2223    Lajean Saver, MD 02/14/21 [phone]

## 2021-02-14 NOTE — ED Triage Notes (Signed)
CP x5 days, missed dialysis yesterday (6/15), MWF dialysis, pt states he just "doesn't feel good." Also endorses all-over back pain 324ASA en route, did not help pain VSS [place] @ 4L

## 2021-02-14 NOTE — ED Notes (Signed)
Pt called for room, no response. NT giving report to this writer states that the PA had requested that he stay however the pt is not seen in the waiting room or outside.

## 2021-02-15 ENCOUNTER — Ambulatory Visit: Payer: Medicare Other | Admitting: Cardiology

## 2021-02-15 NOTE — ED Notes (Signed)
Pt called x2 for vital signs with no response. Charge RN made aware.

## 2021-02-17 ENCOUNTER — Emergency Department (HOSPITAL_COMMUNITY): Payer: Medicare Other

## 2021-02-17 ENCOUNTER — Observation Stay (HOSPITAL_BASED_OUTPATIENT_CLINIC_OR_DEPARTMENT_OTHER)
Admission: EM | Admit: 2021-02-17 | Discharge: 2021-02-17 | Disposition: A | Discharge status: Home / Self Care | Payer: Medicare Other | Source: Home / Self Care | Attending: Emergency Medicine | Admitting: Emergency Medicine

## 2021-02-17 ENCOUNTER — Other Ambulatory Visit: Payer: Self-pay

## 2021-02-17 DIAGNOSIS — Z992 Dependence on renal dialysis: Secondary | ICD-10-CM | POA: Insufficient documentation

## 2021-02-17 DIAGNOSIS — Z20822 Contact with and (suspected) exposure to covid-19: Secondary | ICD-10-CM | POA: Insufficient documentation

## 2021-02-17 DIAGNOSIS — R0902 Hypoxemia: Secondary | ICD-10-CM | POA: Diagnosis not present

## 2021-02-17 DIAGNOSIS — I132 Hypertensive heart and chronic kidney disease with heart failure and with stage 5 chronic kidney disease, or end stage renal disease: Secondary | ICD-10-CM | POA: Insufficient documentation

## 2021-02-17 DIAGNOSIS — Z79899 Other long term (current) drug therapy: Secondary | ICD-10-CM | POA: Insufficient documentation

## 2021-02-17 DIAGNOSIS — E875 Hyperkalemia: Secondary | ICD-10-CM | POA: Diagnosis not present

## 2021-02-17 DIAGNOSIS — N186 End stage renal disease: Secondary | ICD-10-CM | POA: Insufficient documentation

## 2021-02-17 DIAGNOSIS — J189 Pneumonia, unspecified organism: Secondary | ICD-10-CM

## 2021-02-17 DIAGNOSIS — I160 Hypertensive urgency: Secondary | ICD-10-CM | POA: Diagnosis present

## 2021-02-17 DIAGNOSIS — I509 Heart failure, unspecified: Secondary | ICD-10-CM

## 2021-02-17 DIAGNOSIS — I5033 Acute on chronic diastolic (congestive) heart failure: Secondary | ICD-10-CM

## 2021-02-17 DIAGNOSIS — N189 Chronic kidney disease, unspecified: Secondary | ICD-10-CM

## 2021-02-17 DIAGNOSIS — J45909 Unspecified asthma, uncomplicated: Secondary | ICD-10-CM | POA: Insufficient documentation

## 2021-02-17 LAB — RESP PANEL BY RT-PCR (FLU A&B, COVID) ARPGX2
Influenza A by PCR: NEGATIVE
Influenza B by PCR: NEGATIVE
SARS Coronavirus 2 by RT PCR: NEGATIVE

## 2021-02-17 LAB — BASIC METABOLIC PANEL
Anion gap: 17 — ABNORMAL HIGH (ref 5–15)
BUN: 42 mg/dL — ABNORMAL HIGH (ref 8–23)
CO2: 23 mmol/L (ref 22–32)
Calcium: 10.2 mg/dL (ref 8.9–10.3)
Chloride: 99 mmol/L (ref 98–111)
Creatinine, Ser: 6.96 mg/dL — ABNORMAL HIGH (ref 0.61–1.24)
GFR, Estimated: 8 mL/min — ABNORMAL LOW (ref >60)
Glucose, Bld: 88 mg/dL (ref 70–99)
Potassium: 5.1 mmol/L (ref 3.5–5.1)
Sodium: 139 mmol/L (ref 135–145)

## 2021-02-17 LAB — CBC
HCT: 25.2 % — ABNORMAL LOW (ref 39.0–52.0)
Hemoglobin: 8 g/dL — ABNORMAL LOW (ref 13.0–17.0)
MCH: 32.7 pg (ref 26.0–34.0)
MCHC: 31.7 g/dL (ref 30.0–36.0)
MCV: 102.9 fL — ABNORMAL HIGH (ref 80.0–100.0)
Platelets: 166 10*3/uL (ref 150–400)
RBC: 2.45 MIL/uL — ABNORMAL LOW (ref 4.22–5.81)
RDW: 14.2 % (ref 11.5–15.5)
WBC: 5.8 10*3/uL (ref 4.0–10.5)
nRBC: 0 % (ref 0.0–0.2)

## 2021-02-17 LAB — TROPONIN I (HIGH SENSITIVITY)
Troponin I (High Sensitivity): 51 ng/L — ABNORMAL HIGH (ref <18)
Troponin I (High Sensitivity): 50 ng/L — ABNORMAL HIGH (ref <18)

## 2021-02-17 LAB — CBG MONITORING, ED: Glucose-Capillary: 78 mg/dL (ref 70–99)

## 2021-02-17 MED ORDER — ACETAMINOPHEN 500 MG PO TABS: 500.0000 mg | ORAL_TABLET | Freq: Four times a day (QID) | ORAL | Status: DC | PRN

## 2021-02-17 MED ORDER — HYDRALAZINE HCL 25 MG PO TABS
50.0000 mg | ORAL_TABLET | Freq: Three times a day (TID) | ORAL | Status: DC
  Administered 2021-02-17: 50 mg via ORAL
  Filled 2021-02-17: qty 2

## 2021-02-17 MED ORDER — ONDANSETRON HCL 4 MG/2ML IJ SOLN: 4.0000 mg | Freq: Four times a day (QID) | INTRAMUSCULAR | Status: DC | PRN

## 2021-02-17 MED ORDER — SODIUM CHLORIDE 0.9% FLUSH: 3.0000 mL | Freq: Two times a day (BID) | INTRAVENOUS | Status: DC

## 2021-02-17 MED ORDER — TRAMADOL HCL 50 MG PO TABS: 50.0000 mg | ORAL_TABLET | Freq: Four times a day (QID) | ORAL | Status: DC | PRN

## 2021-02-17 MED ORDER — DARBEPOETIN ALFA 200 MCG/0.4ML IJ SOSY
200.0000 ug | PREFILLED_SYRINGE | Freq: Once | INTRAMUSCULAR | Status: DC
  Filled 2021-02-17: qty 0.4

## 2021-02-17 MED ORDER — CINACALCET HCL 30 MG PO TABS
60.0000 mg | ORAL_TABLET | Freq: Every day | ORAL | Status: DC
  Administered 2021-02-17: 60 mg via ORAL
  Filled 2021-02-17 (×2): qty 2

## 2021-02-17 MED ORDER — VITAMIN D 25 MCG (1000 UNIT) PO TABS: 1000.0000 [IU] | ORAL_TABLET | ORAL | Status: DC

## 2021-02-17 MED ORDER — DARBEPOETIN ALFA 200 MCG/0.4ML IJ SOSY
200.0000 ug | PREFILLED_SYRINGE | Freq: Once | INTRAMUSCULAR | Status: AC
  Administered 2021-02-17: 200 ug via SUBCUTANEOUS
  Filled 2021-02-17: qty 0.4

## 2021-02-17 MED ORDER — MORPHINE SULFATE (PF) 4 MG/ML IV SOLN
4.0000 mg | Freq: Once | INTRAVENOUS | Status: AC
Start: 2021-02-17 — End: 2021-02-17
  Administered 2021-02-17: 4 mg via INTRAVENOUS
  Filled 2021-02-17: qty 1

## 2021-02-17 MED ORDER — FLUTICASONE FUROATE-VILANTEROL 100-25 MCG/INH IN AEPB
1.0000 | INHALATION_SPRAY | Freq: Every day | RESPIRATORY_TRACT | Status: DC
  Filled 2021-02-17: qty 28

## 2021-02-17 MED ORDER — AMLODIPINE BESYLATE 5 MG PO TABS: 10.0000 mg | ORAL_TABLET | Freq: Every day | ORAL | Status: DC

## 2021-02-17 MED ORDER — CARVEDILOL 3.125 MG PO TABS
6.2500 mg | ORAL_TABLET | Freq: Two times a day (BID) | ORAL | Status: DC
  Administered 2021-02-17: 6.25 mg via ORAL
  Filled 2021-02-17: qty 2

## 2021-02-17 MED ORDER — RENA-VITE PO TABS
1.0000 | ORAL_TABLET | Freq: Every day | ORAL | Status: DC
  Administered 2021-02-17: 1 via ORAL
  Filled 2021-02-17: qty 1

## 2021-02-17 MED ORDER — SODIUM CHLORIDE 0.9 % IV SOLN: 250.0000 mL | INTRAVENOUS | Status: DC | PRN

## 2021-02-17 MED ORDER — AMLODIPINE BESYLATE 5 MG PO TABS
10.0000 mg | ORAL_TABLET | Freq: Once | ORAL | Status: AC
  Administered 2021-02-17: 10 mg via ORAL
  Filled 2021-02-17: qty 2

## 2021-02-17 MED ORDER — SODIUM CHLORIDE 0.9% FLUSH: 3.0000 mL | INTRAVENOUS | Status: DC | PRN

## 2021-02-17 MED ORDER — ENSURE ENLIVE PO LIQD
237.0000 mL | Freq: Two times a day (BID) | ORAL | Status: DC
  Filled 2021-02-17: qty 237

## 2021-02-17 MED ORDER — DOXERCALCIFEROL 0.5 MCG PO CAPS: 0.5000 ug | ORAL_CAPSULE | ORAL | Status: DC

## 2021-02-17 MED ORDER — PREGABALIN 25 MG PO CAPS: 25.0000 mg | ORAL_CAPSULE | Freq: Every day | ORAL | Status: DC | PRN

## 2021-02-17 MED ORDER — AZITHROMYCIN 250 MG PO TABS
500.0000 mg | ORAL_TABLET | Freq: Once | ORAL | Status: AC
  Administered 2021-02-17: 500 mg via ORAL
  Filled 2021-02-17: qty 2

## 2021-02-17 MED ORDER — SODIUM CHLORIDE 0.9 % IV SOLN
1.0000 g | Freq: Once | INTRAVENOUS | Status: AC
  Administered 2021-02-17: 1 g via INTRAVENOUS
  Filled 2021-02-17: qty 10

## 2021-02-17 MED ORDER — FERRIC CITRATE 1 GM 210 MG(FE) PO TABS
420.0000 mg | ORAL_TABLET | Freq: Three times a day (TID) | ORAL | Status: DC
  Filled 2021-02-17 (×2): qty 2

## 2021-02-17 MED ORDER — ACETAMINOPHEN 325 MG PO TABS
650.0000 mg | ORAL_TABLET | ORAL | Status: DC | PRN
  Administered 2021-02-17: 650 mg via ORAL
  Filled 2021-02-17: qty 2

## 2021-02-17 MED ORDER — FUROSEMIDE 10 MG/ML IJ SOLN
80.0000 mg | Freq: Once | INTRAMUSCULAR | Status: AC
  Administered 2021-02-17: 80 mg via INTRAVENOUS
  Filled 2021-02-17: qty 8

## 2021-02-17 MED ORDER — ASPIRIN 81 MG PO CHEW
324.0000 mg | CHEWABLE_TABLET | Freq: Once | ORAL | Status: AC
  Administered 2021-02-17: 324 mg via ORAL
  Filled 2021-02-17: qty 4

## 2021-02-17 MED ORDER — CHLORHEXIDINE GLUCONATE CLOTH 2 % EX PADS: 6.0000 | MEDICATED_PAD | Freq: Every day | CUTANEOUS | Status: DC

## 2021-02-17 MED ORDER — HEPARIN SODIUM (PORCINE) 5000 UNIT/ML IJ SOLN
5000.0000 [IU] | Freq: Two times a day (BID) | INTRAMUSCULAR | Status: DC
  Administered 2021-02-17: 5000 [IU] via SUBCUTANEOUS
  Filled 2021-02-17: qty 1

## 2021-02-17 MED ORDER — ALBUTEROL SULFATE (2.5 MG/3ML) 0.083% IN NEBU: 2.5000 mg | INHALATION_SOLUTION | Freq: Four times a day (QID) | RESPIRATORY_TRACT | Status: DC | PRN

## 2021-02-17 MED ORDER — SEVELAMER CARBONATE 800 MG PO TABS: 800.0000 mg | ORAL_TABLET | Freq: Every day | ORAL | Status: DC | PRN

## 2021-02-17 NOTE — H&P (Signed)
History and Physical    Thomas Robinson WLN:[id number] DOB: [birth year] DOA: 02/17/2021  PCP: [name], PA (Confirm with patient/family/NH records and if not entered, this has to be entered at [facility] point of entry) Patient coming from: Home  I have personally briefly reviewed patient's old medical records in [place]  Chief Complaint: SOB  HPI: Thomas Robinson is a 60 y.o. male with medical history significant of ESRD on HD MWF, chronic diastolic CHF, severe pulmonary hypertension, COPD, anemia secondary to CKD, came with 2 days of shortness of breath.  Patient claimed that there has been compliant with his HD sessions, last HD was last Friday/2 days ago.  However since yesterday, patient has had increasing shortness of breath and leg swelling, no cough, no fever but he always feels cold.  Last night, patient could not lie flat, and had to use multiple pillows to support him.  He also feels tenderness in his chest "like wearing a belt across chest" and could not take deep breath.  This morning he called EMS, EMS arrived found patient O2 saturation in the 70s and BP in the 200s, patient was given nitroglycerin, then was placed on BiPAP and shifted to ER.  ED Course: Blood pressure significant elevated > 200/100, chest x-ray showed pulmonary edema and suspicious multifocal infiltrates.  Afebrile.  Review of Systems: As per HPI otherwise 14 point review of systems negative.    Past Medical History:  Diagnosis Date   Anaphylactic shock, unspecified, sequela 06/10/2019   Arthritis    Asthma, chronic, unspecified asthma severity, with acute exacerbation 10/23/2017   CAP (community acquired pneumonia) 10/23/2017   Carpal tunnel syndrome of right wrist 10/05/2018   Cataract    right - removed by surgery   Cerebral infarction due to thrombosis of cerebral artery (HCC)    Cervical disc herniation 01/04/2020   Chronic diastolic heart failure ([place]) 04/13/2018   Chronic low back pain  11/24/2019   Constipation    Cough    chronic cough   Encounter for immunization 07/08/2017   ESRD on hemodialysis ([place]) 02/16/2018   Dialysis  T Thus Sat  - [facility]    Fall 06/04/2019   GERD (gastroesophageal reflux disease) 06/04/2019   GI bleeding 05/24/2017   Gram-negative sepsis, unspecified ([place]) 09/05/2017   History of fusion of cervical spine 03/26/2020   Hyperlipidemia    Hypertension    Hypokalemia 06/04/2017   Iron deficiency anemia, unspecified 09/09/2017   Left shoulder pain 11/11/2019   Lumbar radiculopathy 11/24/2019   Lung contusion 06/04/2019   LVH (left ventricular hypertrophy) due to hypertensive disease, with heart failure ([place]) 06/18/2020   Macrocytic anemia 05/24/2017   Moderate protein-calorie malnutrition ([place]) 06/06/2017   Myofascial pain syndrome 06/12/2020   Neuritis of right ulnar nerve 09/13/2018   Non-compliance with renal dialysis ([place]) 02/08/2020   Oxygen deficiency 12/30/2019   O2 sats on RA 87% at PAT appt    Pain in joint of right elbow 09/13/2018   Pulmonary hypertension ([place])    Renovascular hypertension 06/22/2020   Restless leg syndrome    Rib fractures 06/2019   Right   S/P cardiac cath 08/27/2020   normal coronary arteries.   Secondary hyperparathyroidism of renal origin ([place]) 06/04/2017   Thoracic ascending aortic aneurysm (HCC)    4.5 cm 06/04/19 CT   Ulnar neuropathy 10/05/2018   Volume overload 10/23/2017    Past Surgical History:  Procedure Laterality Date   A/V FISTULAGRAM N/A 01/01/2021  Procedure: A/V FISTULAGRAM;  Surgeon: Serafina Mitchell, MD;  Location: [place] [facility];  Service: Cardiovascular;  Laterality: N/A;   A/V FISTULAGRAM N/A 01/15/2021   Procedure: A/V [id number];  Surgeon: Serafina Mitchell, MD;  Location: [place] [facility];  Service: Cardiovascular;  Laterality: N/A;   ANTERIOR CERVICAL DECOMP/DISCECTOMY FUSION N/A 01/04/2020   Procedure: ANTERIOR CERVICAL DECOMPRESSION/DISCECTOMY FUSION CERVICAL FIVE THROUGH SEVEN;   Surgeon: Melina Schools, MD;  Location: [place];  Service: Orthopedics;  Laterality: N/A;  3 hrs   AV FISTULA PLACEMENT Left 05/28/2017   Procedure: LEFT ARM ARTERIOVENOUS (AV) FISTULA CREATION;  Surgeon: Conrad Ne[place], MD;  Location: [place];  Service: Vascular;  Laterality: Left;   AV FISTULA PLACEMENT Left 02/02/2021   Procedure: [place]br[place] left forearm, ligation of left upper arm Aretiovenous fistula;  Surgeon: Elam Dutch, MD;  Location: [facility] OR;  Service: Vascular;  Laterality: Left;   EYE SURGERY Right 06/02/2019   Cataract removed   FRACTURE SURGERY     INSERTION OF DIALYSIS CATHETER Right 02/02/2021   Procedure: INSERTION OF Right Internal jugular TUNNELED  DIALYSIS CATHETER;  Surgeon: Elam Dutch, MD;  Location: [place];  Service: Vascular;  Laterality: Right;   IR DIALY SHUNT INTRO NEEDLE/INTRACATH INITIAL W/IMG LEFT Left 09/12/2020   IR FLUORO GUIDE CV LINE RIGHT  05/25/2017   IR US GUIDE VASC ACCESS LEFT  09/12/2020   IR US GUIDE VASC ACCESS RIGHT  05/25/2017   LIGATION OF ARTERIOVENOUS  FISTULA Left 01/10/2021   Procedure: LIGATION OF LEFT ARM RADIOCEPHALIC FISTULA;  Surgeon: Serafina Mitchell, MD;  Location: [place];  Service: Vascular;  Laterality: Left;   PERIPHERAL VASCULAR BALLOON ANGIOPLASTY Left 01/15/2021   Procedure: PERIPHERAL VASCULAR BALLOON ANGIOPLASTY;  Surgeon: Serafina Mitchell, MD;  Location: [place] [facility];  Service: Cardiovascular;  Laterality: Left;  AVF   REVISON OF ARTERIOVENOUS FISTULA Left 07/18/2019   Procedure: REVISION PLICATION OF RADIOCEPHALIC ARTERIOVENOUS FISTULA LEFT ARM;  Surgeon: Angelia Mould, MD;  Location: [place];  Service: Vascular;  Laterality: Left;   REVISON OF ARTERIOVENOUS FISTULA Left 01/10/2021   Procedure: CONVERSION TO BRACHIOCEPHALIC ARTERIOVENOUS FISTULA;  Surgeon: Serafina Mitchell, MD;  Location: [place];  Service: Vascular;  Laterality: Left;   RINOPLASTY       reports that he has never smoked. He has never used smokeless tobacco.  He reports previous alcohol use. He reports that he does not use drugs.  No Known Allergies  Family History  Problem Relation Age of Onset   Cancer Mother      Prior to Admission medications   Medication Sig Start Date End Date Taking? Authorizing Provider  acetaminophen (TYLENOL) 500 MG tablet Take 500 mg by mouth every 6 (six) hours as needed for moderate pain or headache.    [provider]  albuterol (VENTOLIN HFA) 108 (90 Base) MCG/ACT inhaler Inhale 2 puffs into the lungs in the morning and at bedtime.    [provider]  amLODipine (NORVASC) 10 MG tablet Take 10 mg by mouth daily.     [provider]  BREO ELLIPTA 100-25 MCG/INH AEPB Inhale 1 puff into the lungs 2 (two) times daily as needed (sob/wheezing). 06/26/20   [provider]  cholecalciferol (VITAMIN D3) 25 MCG (1000 UNIT) tablet Take 1,000 Units by mouth once a week.    [provider]  DIPHENHYDRAMINE HCL PO Take 25 mg by mouth as needed (allergies). 09/24/20 09/23/21  [provider]  doxercalciferol (  HECTOROL) 0.5 MCG capsule Take 0.5 mcg by mouth 3 (three) times a week. 11/26/20 11/25/21  [provider]  furosemide (LASIX) 80 MG tablet Take 80 mg by mouth as directed. Take Daily PRN On Non-Dialysis Days (Sun, Tues, Thurs, Sat)    [provider]  hydrALAZINE (APRESOLINE) 25 MG tablet Take 50 mg by mouth in the morning and at bedtime.    [provider]  metoprolol tartrate (LOPRESSOR) 100 MG tablet Take 0.5 tablets (50 mg total) by mouth 2 (two) times daily. Patient taking differently: Take 100 mg by mouth daily. 01/15/21   Alma Friendly, MD  multivitamin (RENA-VIT) TABS tablet Take 1 tablet by mouth daily. 01/12/18   [provider]  pregabalin (LYRICA) 25 MG capsule Take 25 mg by mouth daily as needed (pain). 11/29/20   [provider]  sevelamer carbonate (RENVELA) 800 MG tablet Take 800-1,600 mg by mouth daily as  needed (with snacks or meals). With Snacks & Meals 11/07/19   [provider]  traMADol (ULTRAM) 50 MG tablet Take 1 tablet (50 mg total) by mouth every 6 (six) hours as needed for moderate pain. 01/25/21   Ulyses Amor, PA-C    Physical Exam: Vitals:   02/17/21 1115 02/17/21 1130 02/17/21 1145 02/17/21 1230  BP: (!) 212/97 (!) 181/92 (!) 171/70 (!) 184/79  Pulse: 64 64 65 71  Resp: 16 15 14 19   Temp:      TempSrc:      SpO2: 93% 95% 94% 91%  Weight:      Height:        Constitutional: NAD, calm, comfortable Vitals:   02/17/21 1115 02/17/21 1130 02/17/21 1145 02/17/21 1230  BP: (!) 212/97 (!) 181/92 (!) 171/70 (!) 184/79  Pulse: 64 64 65 71  Resp: 16 15 14 19   Temp:      TempSrc:      SpO2: 93% 95% 94% 91%  Weight:      Height:       Eyes: PERRL, lids and conjunctivae normal ENMT: Mucous membranes are moist. Posterior pharynx clear of any exudate or lesions.Normal dentition.  Neck: normal, supple, no masses, no thyromegaly Respiratory: Diminished breathing sound bilaterally, no wheezing, fine crackles to bilateral mid levels.  Increasing l respiratory effort. No accessory muscle use.  Cardiovascular: Regular rate and rhythm, no murmurs / rubs / gallops.  2+ extremity edema. 2+ pedal pulses. No carotid bruits.  Abdomen: no tenderness, no masses palpated. No hepatosplenomegaly. Bowel sounds positive.  Musculoskeletal: no clubbing / cyanosis. No joint deformity upper and lower extremities. Good ROM, no contractures. Normal muscle tone.  Skin: no rashes, lesions, ulcers. No induration Neurologic: CN 2-12 grossly intact. Sensation intact, DTR normal. Strength 5/5 in all 4.  Psychiatric: Normal judgment and insight. Alert and oriented x 3. Normal mood.     Labs on Admission: I have personally reviewed following labs and imaging studies  CBC: Recent Labs  Lab 02/14/21 2208 02/14/21 2225 02/17/21 1045  WBC 5.8  --  5.8  NEUTROABS 4.6  --   --   HGB 8.4* 8.2* 8.0*   HCT 26.4* 24.0* 25.2*  MCV 101.1*  --  102.9*  PLT 197  --  803   Basic Metabolic Panel: Recent Labs  Lab 02/14/21 2208 02/14/21 2225 02/17/21 1045  NA 138 137 139  K 5.8* 5.8* 5.1  CL 99 104 99  CO2 24  --  23  GLUCOSE 95 90 88  BUN 61* 58* 42*  CREATININE 8.56* 9.20* 6.96*  CALCIUM 9.8  --  10.2   GFR: Estimated Creatinine Clearance: 8.8 mL/min (A) (by C-G formula based on SCr of 6.96 mg/dL (H)). Liver Function Tests: No results for input(s): AST, ALT, ALKPHOS, BILITOT, PROT, ALBUMIN in the last 168 hours. No results for input(s): LIPASE, AMYLASE in the last 168 hours. No results for input(s): AMMONIA in the last 168 hours. Coagulation Profile: No results for input(s): INR, PROTIME in the last 168 hours. Cardiac Enzymes: No results for input(s): CKTOTAL, CKMB, CKMBINDEX, TROPONINI in the last 168 hours. BNP (last 3 results) No results for input(s): PROBNP in the last 8760 hours. HbA1C: No results for input(s): HGBA1C in the last 72 hours. CBG: Recent Labs  Lab 02/17/21 1126  GLUCAP 78   Lipid Profile: No results for input(s): CHOL, HDL, LDLCALC, TRIG, CHOLHDL, LDLDIRECT in the last 72 hours. Thyroid Function Tests: No results for input(s): TSH, T4TOTAL, FREET4, T3FREE, THYROIDAB in the last 72 hours. Anemia Panel: No results for input(s): VITAMINB12, FOLATE, FERRITIN, TIBC, IRON, RETICCTPCT in the last 72 hours. Urine analysis:    Component Value Date/Time   COLORURINE YELLOW 05/24/2017 1729   APPEARANCEUR HAZY (A) 05/24/2017 1729   LABSPEC 1.011 05/24/2017 1729   PHURINE 5.0 05/24/2017 1729   GLUCOSEU 50 (A) 05/24/2017 1729   HGBUR SMALL (A) 05/24/2017 1729   BILIRUBINUR NEGATIVE 05/24/2017 1729   KETONESUR NEGATIVE 05/24/2017 1729   PROTEINUR 100 (A) 05/24/2017 1729   UROBILINOGEN 0.2 04/09/2015 1527   NITRITE NEGATIVE 05/24/2017 1729   LEUKOCYTESUR TRACE (A) 05/24/2017 1729    Radiological Exams on Admission: DG Chest Portable 1 View  Result  Date: 02/17/2021 CLINICAL DATA:  Chest pain. EXAM: PORTABLE CHEST 1 VIEW COMPARISON:  Prior chest radiographs 02/14/2021 and earlier. FINDINGS: Unchanged position of a right-sided dialysis catheter with tip projecting at the level of the superior cavoatrial junction. Unchanged cardiomegaly and central pulmonary vascular congestion. Aortic atherosclerosis. Mild ill-defined opacity within the right lung base, new as compared to the prior examination. No evidence of pleural effusion or pneumothorax. No acute bony abnormality identified. Partially imaged ACDF hardware. IMPRESSION: Unchanged cardiomegaly and central pulmonary vascular congestion. Mild ill-defined opacity within the right lung base, new as compared to the chest radiograph of 02/14/2021. This may reflect atelectasis. Early pneumonia is difficult to exclude and clinical correlation is recommended. Aortic Atherosclerosis (ICD10-I70.0). Electronically Signed   By: Kellie Simmering DO   On: 02/17/2021 11:09    EKG: Independently reviewed.  Sinus, LVH.  Assessment/Plan Active Problems:   Hypertensive urgency   CHF (congestive heart failure) ([place])  (please populate well all problems here in Problem List. (For example, if patient is on BP meds at home and you resume or decide to hold them, it is a problem that needs to be her. Same for CAD, COPD, HLD and so on)  Acute hypoxic respite failure -Secondary to acute on chronic diastolic CHF decompensation, secondary to HTN emergency, with significant symptoms signs of fluid overload.  Less suspicious for pneumonia.  Discontinue antibiotics. -1 dose of Lasix 80 mg IV given.  Messaged on-call nephrologist for HD today vs tomorrow.  HTN emergency -Resume home BP meds amlodipine and hydralazine -Change metoprolol to Coreg to better address HTN.  Review patient blood pressure record appears patient most recent BP in the range of 428J to 681L systolic.  Patient claimed that he has been compliant with all his BP  meds.  Severe pulmonary hypertension -Patient probably has to tolerate daily  Lasix in the future (now Lasix is every other day on the nondialysis days)  Worsening of chronic anemia, secondary to CKD -Iron study was 64 months old, and patient recently had left arm AV fistula bleeding and lost significant amount of blood status post 1 unit of packed RBC.  H&H appears to be stable, but will check iron study.  Otherwise if iron level stable, we will give EPO.  Chest pain -Likely related to pulmonary edema fluid overload, troponin level chronically elevated. Outpatient cardiology follow-up for outpatient stress test.  Moderate protein calorie malnutrition -BMI 18.7, start protein supplement, consult nutrition.  COPD -Stable  DVT prophylaxis: Heparin subcu Code Status: Full code Family Communication: None at bedside Disposition Plan: Expect less than 2 midnight hospital stay Consults called: Nephrology Admission status: Telemetry observation   Lequita Halt MD [facility] Pager [phone]  02/17/2021, 12:44 PM

## 2021-02-17 NOTE — Consult Note (Signed)
[facility] Renal Consultation Note    Indication for Consultation:  Management of ESRD/hemodialysis, anemia, hypertension/volume, and secondary hyperparathyroidism. PCP:  HPI: Thomas Robinson is a 60 y.o. male with ESRD, HTN, HL, pulm HTN, COPD, and recent L arm infection who is being admitted with hypoxic respiratory failure.  Thomas Robinson has chronic dyspnea, which worsened last night in addition to L chest pressure which prompted calling 911. EMS arrived and he was found to be severely hypertensive and hypoxic to 70's requiring BiPAP. In the ED, labs showed Na 139, K 5.1, Ca 10.2, WBC 5.8, Hgb 8, Flu/COVID negative. CXR with vascular congestion/pulm edema, as well as possible early pneumonia. He was weaned to nasal O2 3.5L/min and given IV Lasix, Ceftriaxone and azithromycin. In process of being admitted. Our team was consulted for dialysis.  Evaluated in ED bed - sitting up in bed on nasal O2 cannual, not in distress. Denies fever, but always feeling cold. No CP, abdominal pain, N/V/D. Asking if sutures in L arm can be removed b/c they are bothering him.  Dialyzes on MWF at [facility]. He typically comes to all of his dialysis, but cuts his run time. However - he has been reaching his dry weight consistently (presumed has lost really body weight and dry weight needs to be lowered). Using [facility] for now, s/p recent L arm surgery (ligation of AVF and evacuation of forearm hematoma 02/02/21). The sutures in the forearm do appear to be a little overgrown.  Past Medical History:  Diagnosis Date   Anaphylactic shock, unspecified, sequela 06/10/2019   Arthritis    Asthma, chronic, unspecified asthma severity, with acute exacerbation 10/23/2017   CAP (community acquired pneumonia) 10/23/2017   Carpal tunnel syndrome of right wrist 10/05/2018   Cataract    right - removed by surgery   Cerebral infarction due to thrombosis of cerebral artery (HCC)    Cervical disc herniation 01/04/2020    Chronic diastolic heart failure ([place]) 04/13/2018   Chronic low back pain 11/24/2019   Constipation    Cough    chronic cough   Encounter for immunization 07/08/2017   ESRD on hemodialysis ([place]) 02/16/2018   Dialysis  T Thus Sat  - [facility]    Fall 06/04/2019   GERD (gastroesophageal reflux disease) 06/04/2019   GI bleeding 05/24/2017   Gram-negative sepsis, unspecified ([place]) 09/05/2017   History of fusion of cervical spine 03/26/2020   Hyperlipidemia    Hypertension    Hypokalemia 06/04/2017   Iron deficiency anemia, unspecified 09/09/2017   Left shoulder pain 11/11/2019   Lumbar radiculopathy 11/24/2019   Lung contusion 06/04/2019   LVH (left ventricular hypertrophy) due to hypertensive disease, with heart failure ([place]) 06/18/2020   Macrocytic anemia 05/24/2017   Moderate protein-calorie malnutrition ([place]) 06/06/2017   Myofascial pain syndrome 06/12/2020   Neuritis of right ulnar nerve 09/13/2018   Non-compliance with renal dialysis ([place]) 02/08/2020   Oxygen deficiency 12/30/2019   O2 sats on RA 87% at PAT appt    Pain in joint of right elbow 09/13/2018   Pulmonary hypertension ([place])    Renovascular hypertension 06/22/2020   Restless leg syndrome    Rib fractures 06/2019   Right   S/P cardiac cath 08/27/2020   normal coronary arteries.   Secondary hyperparathyroidism of renal origin ([place]) 06/04/2017   Thoracic ascending aortic aneurysm (HCC)    4.5 cm 06/04/19 CT   Ulnar neuropathy 10/05/2018   Volume overload 10/23/2017   Past Surgical History:  Procedure Laterality Date   A/V FISTULAGRAM N/A 01/01/2021   Procedure: A/V FISTULAGRAM;  Surgeon: Serafina Mitchell, MD;  Location: [place] [facility];  Service: Cardiovascular;  Laterality: N/A;   A/V FISTULAGRAM N/A 01/15/2021   Procedure: A/V [id number];  Surgeon: Serafina Mitchell, MD;  Location: [place] [facility];  Service: Cardiovascular;  Laterality: N/A;   ANTERIOR CERVICAL DECOMP/DISCECTOMY FUSION N/A 01/04/2020   Procedure:  ANTERIOR CERVICAL DECOMPRESSION/DISCECTOMY FUSION CERVICAL FIVE THROUGH SEVEN;  Surgeon: Melina Schools, MD;  Location: [place];  Service: Orthopedics;  Laterality: N/A;  3 hrs   AV FISTULA PLACEMENT Left 05/28/2017   Procedure: LEFT ARM ARTERIOVENOUS (AV) FISTULA CREATION;  Surgeon: Conrad Saukville, MD;  Location: [place];  Service: Vascular;  Laterality: Left;   AV FISTULA PLACEMENT Left 02/02/2021   Procedure: Debride left forearm, ligation of left upper arm Aretiovenous fistula;  Surgeon: Elam Dutch, MD;  Location: [facility] OR;  Service: Vascular;  Laterality: Left;   EYE SURGERY Right 06/02/2019   Cataract removed   FRACTURE SURGERY     INSERTION OF DIALYSIS CATHETER Right 02/02/2021   Procedure: INSERTION OF Right Internal jugular TUNNELED  DIALYSIS CATHETER;  Surgeon: Elam Dutch, MD;  Location: [place];  Service: Vascular;  Laterality: Right;   IR DIALY SHUNT INTRO NEEDLE/INTRACATH INITIAL W/IMG LEFT Left 09/12/2020   IR FLUORO GUIDE CV LINE RIGHT  05/25/2017   IR US GUIDE VASC ACCESS LEFT  09/12/2020   IR US GUIDE VASC ACCESS RIGHT  05/25/2017   LIGATION OF ARTERIOVENOUS  FISTULA Left 01/10/2021   Procedure: LIGATION OF LEFT ARM RADIOCEPHALIC FISTULA;  Surgeon: Serafina Mitchell, MD;  Location: [place];  Service: Vascular;  Laterality: Left;   PERIPHERAL VASCULAR BALLOON ANGIOPLASTY Left 01/15/2021   Procedure: PERIPHERAL VASCULAR BALLOON ANGIOPLASTY;  Surgeon: Serafina Mitchell, MD;  Location: [place] [facility];  Service: Cardiovascular;  Laterality: Left;  AVF   REVISON OF ARTERIOVENOUS FISTULA Left 07/18/2019   Procedure: REVISION PLICATION OF RADIOCEPHALIC ARTERIOVENOUS FISTULA LEFT ARM;  Surgeon: Angelia Mould, MD;  Location: [facility] OR;  Service: Vascular;  Laterality: Left;   REVISON OF ARTERIOVENOUS FISTULA Left 01/10/2021   Procedure: CONVERSION TO BRACHIOCEPHALIC ARTERIOVENOUS FISTULA;  Surgeon: Serafina Mitchell, MD;  Location: [facility] OR;  Service: Vascular;  Laterality: Left;   RINOPLASTY      Family History  Problem Relation Age of Onset   Cancer Mother    Social History:  reports that he has never smoked. He has never used smokeless tobacco. He reports previous alcohol use. He reports that he does not use drugs.  ROS: As per HPI otherwise negative.  Physical Exam: Vitals:   02/17/21 1115 02/17/21 1130 02/17/21 1145 02/17/21 1230  BP: (!) 212/97 (!) 181/92 (!) 171/70 (!) 184/79  Pulse: 64 64 65 71  Resp: 16 15 14 19   Temp:      TempSrc:      SpO2: 93% 95% 94% 91%  Weight:      Height:         General: Well developed, well nourished, in no acute distress. Nasal cannula in place. Head: Normocephalic, atraumatic, sclera non-icteric, mucus membranes are moist. Neck: Supple without lymphadenopathy/masses. Lungs: Dull in B bases, clear in upper lobes. Heart: RRR with normal S1, S2. No murmurs, rubs, or gallops appreciated. Abdomen: Soft, non-tender, non-distended with normoactive bowel sounds. Musculoskeletal:  Strength and tone appear normal for age. Lower extremities: 2+ BLE edema. L forearm bandaged with edema in  L hand. Overgrown sutures in antecubital fossa Neuro: Alert and oriented X 3. Moves all extremities spontaneously. Psych:  Responds to questions appropriately with a normal affect. Dialysis Access: TDC in R chest, non-tender  No Known Allergies Prior to Admission medications   Medication Sig Start Date End Date Taking? Authorizing Provider  acetaminophen (TYLENOL) 500 MG tablet Take 500 mg by mouth every 6 (six) hours as needed for moderate pain or headache.    [provider]  albuterol (VENTOLIN HFA) 108 (90 Base) MCG/ACT inhaler Inhale 2 puffs into the lungs in the morning and at bedtime.    [provider]  amLODipine (NORVASC) 10 MG tablet Take 10 mg by mouth daily.     [provider]  BREO ELLIPTA 100-25 MCG/INH AEPB Inhale 1 puff into the lungs 2 (two) times daily as needed (sob/wheezing). 06/26/20   [provider]  cholecalciferol (VITAMIN D3) 25 MCG (1000 UNIT) tablet Take 1,000 Units by mouth once a week.    [provider]  DIPHENHYDRAMINE HCL PO Take 25 mg by mouth as needed (allergies). 09/24/20 09/23/21  [provider]  doxercalciferol (HECTOROL) 0.5 MCG capsule Take 0.5 mcg by mouth 3 (three) times a week. 11/26/20 11/25/21  [provider]  furosemide (LASIX) 80 MG tablet Take 80 mg by mouth as directed. Take Daily PRN On Non-Dialysis Days (Sun, Tues, Thurs, Sat)    [provider]  hydrALAZINE (APRESOLINE) 25 MG tablet Take 50 mg by mouth in the morning and at bedtime.    [provider]  metoprolol tartrate (LOPRESSOR) 100 MG tablet Take 0.5 tablets (50 mg total) by mouth 2 (two) times daily. Patient taking differently: Take 100 mg by mouth daily. 01/15/21   Alma Friendly, MD  multivitamin (RENA-VIT) TABS tablet Take 1 tablet by mouth daily. 01/12/18   [provider]  pregabalin (LYRICA) 25 MG capsule Take 25 mg by mouth daily as needed (pain). 11/29/20   [provider]  sevelamer carbonate (RENVELA) 800 MG tablet Take 800-1,600 mg by mouth daily as needed (with snacks or meals). With Snacks & Meals 11/07/19   [provider]  traMADol (ULTRAM) 50 MG tablet Take 1 tablet (50 mg total) by mouth every 6 (six) hours as needed for moderate pain. 01/25/21   Ulyses Amor, PA-C   Current Facility-Administered Medications  Medication Dose Route Frequency Provider Last Rate Last Admin   0.9 %  sodium chloride infusion  250 mL Intravenous PRN Wynetta Fines T, MD       acetaminophen (TYLENOL) tablet 650 mg  650 mg Oral Q4H PRN Wynetta Fines T, MD       albuterol (PROVENTIL) (2.5 MG/3ML) 0.083% nebulizer solution 2.5 mg  2.5 mg Inhalation Q6H PRN Wynetta Fines T, MD       amLODipine (NORVASC) tablet 10 mg  10 mg Oral Daily Wynetta Fines T, MD       carvedilol (COREG) tablet 6.25 mg  6.25 mg Oral BID WC Wynetta Fines T, MD        cholecalciferol (VITAMIN D3) tablet 1,000 Units  1,000 Units Oral Weekly Lequita Halt, MD       [START ON 02/18/2021] doxercalciferol (HECTOROL) capsule 0.5 mcg  0.5 mcg Oral Once per day on Mon Wed Fri Zhang, Ping T, MD       feeding supplement (ENSURE ENLIVE / ENSURE PLUS) liquid 237 mL  237 mL Oral BID BM Lequita Halt, MD  fluticasone furoate-vilanterol (BREO ELLIPTA) 100-25 MCG/INH 1 puff  1 puff Inhalation Daily Wynetta Fines T, MD       heparin injection 5,000 Units  5,000 Units Subcutaneous Q12H Wynetta Fines T, MD       hydrALAZINE (APRESOLINE) tablet 50 mg  50 mg Oral Q8H Lequita Halt, MD       multivitamin (RENA-VIT) tablet 1 tablet  1 tablet Oral Daily Wynetta Fines T, MD       ondansetron [facility]) injection 4 mg  4 mg Intravenous Q6H PRN Lequita Halt, MD       pregabalin (LYRICA) capsule 25 mg  25 mg Oral Daily PRN Wynetta Fines T, MD       sevelamer carbonate (RENVELA) tablet 800-1,600 mg  800-1,600 mg Oral Daily PRN Wynetta Fines T, MD       sodium chloride flush (NS) 0.9 % injection 3 mL  3 mL Intravenous Q12H Wynetta Fines T, MD       sodium chloride flush (NS) 0.9 % injection 3 mL  3 mL Intravenous PRN Lequita Halt, MD       traMADol Veatrice Bourbon) tablet 50 mg  50 mg Oral Q6H PRN Lequita Halt, MD       Current Outpatient Medications  Medication Sig Dispense Refill   acetaminophen (TYLENOL) 500 MG tablet Take 500 mg by mouth every 6 (six) hours as needed for moderate pain or headache.     albuterol (VENTOLIN HFA) 108 (90 Base) MCG/ACT inhaler Inhale 2 puffs into the lungs in the morning and at bedtime.     amLODipine (NORVASC) 10 MG tablet Take 10 mg by mouth daily.      BREO ELLIPTA 100-25 MCG/INH AEPB Inhale 1 puff into the lungs 2 (two) times daily as needed (sob/wheezing).     cholecalciferol (VITAMIN D3) 25 MCG (1000 UNIT) tablet Take 1,000 Units by mouth once a week.     DIPHENHYDRAMINE HCL PO Take 25 mg by mouth as needed (allergies).     doxercalciferol (HECTOROL) 0.5 MCG  capsule Take 0.5 mcg by mouth 3 (three) times a week.     furosemide (LASIX) 80 MG tablet Take 80 mg by mouth as directed. Take Daily PRN On Non-Dialysis Days (Sun, Tues, Thurs, Sat)     hydrALAZINE (APRESOLINE) 25 MG tablet Take 50 mg by mouth in the morning and at bedtime.     metoprolol tartrate (LOPRESSOR) 100 MG tablet Take 0.5 tablets (50 mg total) by mouth 2 (two) times daily. (Patient taking differently: Take 100 mg by mouth daily.)     multivitamin (RENA-VIT) TABS tablet Take 1 tablet by mouth daily.     pregabalin (LYRICA) 25 MG capsule Take 25 mg by mouth daily as needed (pain).     sevelamer carbonate (RENVELA) 800 MG tablet Take 800-1,600 mg by mouth daily as needed (with snacks or meals). With Snacks & Meals     traMADol (ULTRAM) 50 MG tablet Take 1 tablet (50 mg total) by mouth every 6 (six) hours as needed for moderate pain. 20 tablet 0   Labs: Basic Metabolic Panel: Recent Labs  Lab 02/14/21 2208 02/14/21 2225 02/17/21 1045  NA 138 137 139  K 5.8* 5.8* 5.1  CL 99 104 99  CO2 24  --  23  GLUCOSE 95 90 88  BUN 61* 58* 42*  CREATININE 8.56* 9.20* 6.96*  CALCIUM 9.8  --  10.2   CBC: Recent Labs  Lab 02/14/21 2208 02/14/21 2225 02/17/21 1045  WBC 5.8  --  5.8  NEUTROABS 4.6  --   --   HGB 8.4* 8.2* 8.0*  HCT 26.4* 24.0* 25.2*  MCV 101.1*  --  102.9*  PLT 197  --  166   Studies/Results: DG Chest Portable 1 View  Result Date: 02/17/2021 CLINICAL DATA:  Chest pain. EXAM: PORTABLE CHEST 1 VIEW COMPARISON:  Prior chest radiographs 02/14/2021 and earlier. FINDINGS: Unchanged position of a right-sided dialysis catheter with tip projecting at the level of the superior cavoatrial junction. Unchanged cardiomegaly and central pulmonary vascular congestion. Aortic atherosclerosis. Mild ill-defined opacity within the right lung base, new as compared to the prior examination. No evidence of pleural effusion or pneumothorax. No acute bony abnormality identified. Partially imaged  ACDF hardware. IMPRESSION: Unchanged cardiomegaly and central pulmonary vascular congestion. Mild ill-defined opacity within the right lung base, new as compared to the chest radiograph of 02/14/2021. This may reflect atelectasis. Early pneumonia is difficult to exclude and clinical correlation is recommended. Aortic Atherosclerosis (ICD10-I70.0). Electronically Signed   By: Kellie Simmering DO   On: 02/17/2021 11:09    Dialysis Orders:  MWF at [facility] 4hr, 400/800, EDW 56.5kg, 2K/2Ca, AVF + TDC, heparin 2000 bolus - Hectoral 58mcg IV q HD - Mircera 278mcg IV q 2 weeks - last given 5/23  Assessment/Plan:  Hypoxia/Pulm edema: Will plan to dialyze tonight for volume correction, goal 2.5 - 3L if tolerates.  ?Pneumonia: On imaging, normal WBC, afebrile. S/p 1 dose Ceftriaxone/azithromycin.  ESRD:  Usual MWF schedule - has not missed HD, likely has lost some real body weight and dry weight not adjusted - will need lower EDW on discharge.  Hypertension/volume: BP very high - resume home meds and should improve with UF.  Anemia: Hgb 8 -- looks like overdue for ESA, will order Aranesp 200 to be given with next HD.  Metabolic bone disease: Ca high - hold VDRA, resume home binders Lorin Picket) + sensipar. Hx L arm hematoma s/p evacuation by VVS 6/4: Sutures still in place which can likely be removed (>2 weeks out). Wound care, per primary.  Veneta Penton, PA-C 02/17/2021, 1:46 PM  [employer]

## 2021-02-17 NOTE — ED Notes (Addendum)
Pt left AMA. Dr. Roosevelt Locks notified.

## 2021-02-17 NOTE — Discharge Summary (Signed)
Physician Discharge Summary  Thomas Robinson YIR:[id number] DOB: Jun 11, 1961 DOA: 02/17/2021  PCP: [name], PA  Admit date: 02/17/2021 Discharge date: 02/17/2021  Admitted From: Home Disposition:  HOme Recommendations for Outpatient Follow-up:  Follow up with PCP in 1-2 weeks Please obtain BMP/CBC in one week Please follow up on the following pending results:  Home Health: No Equipment/Devices:No  Discharge Condition:Moderate CODE STATUS:Full Code Diet recommendation: Nephro with 1200 mL fluid restriction Brief/Interim Summary: Patient history of ESRD on HD Monday Wednesday Friday, came with fluid overload and hypertension emergency and acute hypoxic respiratory failure and acute on chronic diastolic CHF decompensation.  Work-up image study showed fluid overload, on x-ray showed pulmonary edema.  Patient stabilized with BiPAP 14 for nasal cannula 4 L O2 saturation low 90s.  Nephrology consulted plan for emergency dialysis tonight.  Patient however insisted he feels better and would like to go home.  Long discussion with patient at bedside regarding possible negative outcomes without emergency dialysis such as worsening of hypoxia, CHF, and worsening of hypotension uncontrolled which can potentially cause MI, stroke and other severe comorbidities.  Despite, patient signed AMA and claims that he will go to outpatient HD center tomorrow morning 6 AM.  Discharge Diagnoses:  Active Problems:   Hypertensive urgency   Hypoxia   CHF (congestive heart failure) [facility])    Discharge Instructions Come back to ED, if you develop chest pain, worsening shortness of breath, symptoms of syncope.    No Known Allergies  Consultations: Nephrology   Procedures/Studies: DG Chest Portable 1 View  Result Date: 02/17/2021 CLINICAL DATA:  Chest pain. EXAM: PORTABLE CHEST 1 VIEW COMPARISON:  Prior chest radiographs 02/14/2021 and earlier. FINDINGS: Unchanged position of a right-sided dialysis  catheter with tip projecting at the level of the superior cavoatrial junction. Unchanged cardiomegaly and central pulmonary vascular congestion. Aortic atherosclerosis. Mild ill-defined opacity within the right lung base, new as compared to the prior examination. No evidence of pleural effusion or pneumothorax. No acute bony abnormality identified. Partially imaged ACDF hardware. IMPRESSION: Unchanged cardiomegaly and central pulmonary vascular congestion. Mild ill-defined opacity within the right lung base, new as compared to the chest radiograph of 02/14/2021. This may reflect atelectasis. Early pneumonia is difficult to exclude and clinical correlation is recommended. Aortic Atherosclerosis (ICD10-I70.0). Electronically Signed   By: Kellie Simmering DO   On: 02/17/2021 11:09   DG Chest Port 1 View  Result Date: 02/14/2021 CLINICAL DATA:  60 year old male with shortness of breath and chest pain EXAM: PORTABLE CHEST 1 VIEW COMPARISON:  Chest radiograph dated 02/02/2021. FINDINGS: Right-sided dialysis catheter in similar position. There is cardiomegaly with vascular congestion and probable mild edema. No focal consolidation or pneumothorax. Trace left pleural effusion may be present. No acute osseous pathology. Cervical ACDF. IMPRESSION: Cardiomegaly with findings of mild CHF similar to prior radiograph. No focal consolidation. Electronically Signed   By: Anner Crete M.D.   On: 02/14/2021 22:59   DG CHEST PORT 1 VIEW  Result Date: 02/02/2021 CLINICAL DATA:  S/P hemodialysis catheter insertion EXAM: PORTABLE CHEST - 1 VIEW COMPARISON:  01/13/2021 FINDINGS: Tunneled right IJ hemodialysis catheter extends to the cavoatrial junction. No pneumothorax. Mild cardiomegaly stable.  Aortic Atherosclerosis (ICD10-170.0). Hazy predominately perihilar interstitial opacities suggesting mild edema. Patchy somewhat coarse poorly marginated airspace opacities in the right apex and left retrocardiac region. Blunting of the  left lateral costophrenic angle suggesting small effusion. Cervical fixation hardware partially visualized. IMPRESSION: 1. Central venous catheter to cavoatrial junction without pneumothorax. Electronically Signed  By: Lucrezia Europe M.D.   On: 02/02/2021 09:43   DG Fluoro Guide CV Line-No Report  Result Date: 02/02/2021 Fluoroscopy was utilized by the requesting physician.  No radiographic interpretation.   (Echo, Carotid, EGD, Colonoscopy, ERCP)    Subjective:   Discharge Exam: Vitals:   02/17/21 1545 02/17/21 1717  BP: (!) 165/49 (!) 183/98  Pulse: 71 77  Resp:  19  Temp:    SpO2: 93% 90%   Vitals:   02/17/21 1230 02/17/21 1345 02/17/21 1545 02/17/21 1717  BP: (!) 184/79 (!) 164/59 (!) 165/49 (!) 183/98  Pulse: 71 71 71 77  Resp: 19 12  19   Temp:      TempSrc:      SpO2: 91% 93% 93% 90%  Weight:      Height:        General: Pt is alert, awake, mild respiratory distress Cardiovascular: RRR, S1/S2 +, no rubs, no gallops, 2+ edema Respiratory: CTA bilaterally, no wheezing, fine crackles on bilateral lungs to the mid levels abdominal: Soft, NT, ND, bowel sounds + Extremities: no edema, no cyanosis    The results of significant diagnostics from this hospitalization (including imaging, microbiology, ancillary and laboratory) are listed below for reference.     Microbiology: Recent Results (from the past 240 hour(s))  Resp Panel by RT-PCR (Flu A&B, Covid) Nasopharyngeal Swab     Status: None   Collection Time: 02/17/21 10:44 AM   Specimen: Nasopharyngeal Swab; Nasopharyngeal(NP) swabs in vial transport medium  Result Value Ref Range Status   SARS Coronavirus 2 by RT PCR NEGATIVE NEGATIVE Final    Comment: (NOTE) SARS-CoV-2 target nucleic acids are NOT DETECTED.  The SARS-CoV-2 RNA is generally detectable in upper respiratory specimens during the acute phase of infection. The lowest concentration of SARS-CoV-2 viral copies this assay can detect is 138 copies/mL. A  negative result does not preclude SARS-Cov-2 infection and should not be used as the sole basis for treatment or other patient management decisions. A negative result may occur with  improper specimen collection/handling, submission of specimen other than nasopharyngeal swab, presence of viral mutation(s) within the areas targeted by this assay, and inadequate number of viral copies(<138 copies/mL). A negative result must be combined with clinical observations, patient history, and epidemiological information. The expected result is Negative.  Fact Sheet for Patients:  EntrepreneurPulse.com.au  Fact Sheet for Healthcare Providers:  IncredibleEmployment.be  This test is no t yet approved or cleared by the [place] FDA and  has been authorized for detection and/or diagnosis of SARS-CoV-2 by FDA under an Emergency Use Authorization (EUA). This EUA will remain  in effect (meaning this test can be used) for the duration of the COVID-19 declaration under Section 564(b)(1) of the Act, 21 U.S.C.section 360bbb-3(b)(1), unless the authorization is terminated  or revoked sooner.       Influenza A by PCR NEGATIVE NEGATIVE Final   Influenza B by PCR NEGATIVE NEGATIVE Final    Comment: (NOTE) The Xpert Xpress SARS-CoV-2/FLU/RSV plus assay is intended as an aid in the diagnosis of influenza from Nasopharyngeal swab specimens and should not be used as a sole basis for treatment. Nasal washings and aspirates are unacceptable for Xpert Xpress SARS-CoV-2/FLU/RSV testing.  Fact Sheet for Patients: EntrepreneurPulse.com.au  Fact Sheet for Healthcare Providers: IncredibleEmployment.be  This test is not yet approved or cleared by the [place] FDA and has been authorized for detection and/or diagnosis of SARS-CoV-2 by FDA under an Emergency Use Authorization (EUA). This EUA will  remain in effect (meaning this test can  be used) for the duration of the COVID-19 declaration under Section 564(b)(1) of the Act, 21 U.S.C. section 360bbb-3(b)(1), unless the authorization is terminated or revoked.  Performed at [facility], [place] [address], [place], [place] [address]      Labs: BNP (last 3 results) Recent Labs    09/18/20 0635  BNP 1,700.1*   Basic Metabolic Panel: Recent Labs  Lab 02/14/21 2208 02/14/21 2225 02/17/21 1045  NA 138 137 139  K 5.8* 5.8* 5.1  CL 99 104 99  CO2 24  --  23  GLUCOSE 95 90 88  BUN 61* 58* 42*  CREATININE 8.56* 9.20* 6.96*  CALCIUM 9.8  --  10.2   Liver Function Tests: No results for input(s): AST, ALT, ALKPHOS, BILITOT, PROT, ALBUMIN in the last 168 hours. No results for input(s): LIPASE, AMYLASE in the last 168 hours. No results for input(s): AMMONIA in the last 168 hours. CBC: Recent Labs  Lab 02/14/21 2208 02/14/21 2225 02/17/21 1045  WBC 5.8  --  5.8  NEUTROABS 4.6  --   --   HGB 8.4* 8.2* 8.0*  HCT 26.4* 24.0* 25.2*  MCV 101.1*  --  102.9*  PLT 197  --  166   Cardiac Enzymes: No results for input(s): CKTOTAL, CKMB, CKMBINDEX, TROPONINI in the last 168 hours. BNP: Invalid input(s): POCBNP CBG: Recent Labs  Lab 02/17/21 1126  GLUCAP 78   D-Dimer No results for input(s): DDIMER in the last 72 hours. Hgb A1c No results for input(s): HGBA1C in the last 72 hours. Lipid Profile No results for input(s): CHOL, HDL, LDLCALC, TRIG, CHOLHDL, LDLDIRECT in the last 72 hours. Thyroid function studies No results for input(s): TSH, T4TOTAL, T3FREE, THYROIDAB in the last 72 hours.  Invalid input(s): FREET3 Anemia work up No results for input(s): VITAMINB12, FOLATE, FERRITIN, TIBC, IRON, RETICCTPCT in the last 72 hours. Urinalysis    Component Value Date/Time   COLORURINE YELLOW 05/24/2017 1729   APPEARANCEUR HAZY (A) 05/24/2017 1729   LABSPEC 1.011 05/24/2017 1729   PHURINE 5.0 05/24/2017 1729   GLUCOSEU 50 (A) 05/24/2017 1729   HGBUR SMALL  (A) 05/24/2017 1729   BILIRUBINUR NEGATIVE 05/24/2017 1729   KETONESUR NEGATIVE 05/24/2017 1729   PROTEINUR 100 (A) 05/24/2017 1729   UROBILINOGEN 0.2 04/09/2015 1527   NITRITE NEGATIVE 05/24/2017 1729   LEUKOCYTESUR TRACE (A) 05/24/2017 1729   Sepsis Labs Invalid input(s): PROCALCITONIN,  WBC,  LACTICIDVEN Microbiology Recent Results (from the past 240 hour(s))  Resp Panel by RT-PCR (Flu A&B, Covid) Nasopharyngeal Swab     Status: None   Collection Time: 02/17/21 10:44 AM   Specimen: Nasopharyngeal Swab; Nasopharyngeal(NP) swabs in vial transport medium  Result Value Ref Range Status   SARS Coronavirus 2 by RT PCR NEGATIVE NEGATIVE Final    Comment: (NOTE) SARS-CoV-2 target nucleic acids are NOT DETECTED.  The SARS-CoV-2 RNA is generally detectable in upper respiratory specimens during the acute phase of infection. The lowest concentration of SARS-CoV-2 viral copies this assay can detect is 138 copies/mL. A negative result does not preclude SARS-Cov-2 infection and should not be used as the sole basis for treatment or other patient management decisions. A negative result may occur with  improper specimen collection/handling, submission of specimen other than nasopharyngeal swab, presence of viral mutation(s) within the areas targeted by this assay, and inadequate number of viral copies(<138 copies/mL). A negative result must be combined with clinical observations, patient history, and epidemiological  information. The expected result is Negative.  Fact Sheet for Patients:  EntrepreneurPulse.com.au  Fact Sheet for Healthcare Providers:  IncredibleEmployment.be  This test is no t yet approved or cleared by the [place] FDA and  has been authorized for detection and/or diagnosis of SARS-CoV-2 by FDA under an Emergency Use Authorization (EUA). This EUA will remain  in effect (meaning this test can be used) for the duration of the COVID-19  declaration under Section 564(b)(1) of the Act, 21 U.S.C.section 360bbb-3(b)(1), unless the authorization is terminated  or revoked sooner.       Influenza A by PCR NEGATIVE NEGATIVE Final   Influenza B by PCR NEGATIVE NEGATIVE Final    Comment: (NOTE) The Xpert Xpress SARS-CoV-2/FLU/RSV plus assay is intended as an aid in the diagnosis of influenza from Nasopharyngeal swab specimens and should not be used as a sole basis for treatment. Nasal washings and aspirates are unacceptable for Xpert Xpress SARS-CoV-2/FLU/RSV testing.  Fact Sheet for Patients: EntrepreneurPulse.com.au  Fact Sheet for Healthcare Providers: IncredibleEmployment.be  This test is not yet approved or cleared by the [place] FDA and has been authorized for detection and/or diagnosis of SARS-CoV-2 by FDA under an Emergency Use Authorization (EUA). This EUA will remain in effect (meaning this test can be used) for the duration of the COVID-19 declaration under Section 564(b)(1) of the Act, 21 U.S.C. section 360bbb-3(b)(1), unless the authorization is terminated or revoked.  Performed at [facility], [place] [address], [place], [place] [address]      Time coordinating discharge: Over 30 minutes  SIGNED:   Lequita Halt, MD  [facility] 02/17/2021, 6:24 PM Pager [phone]

## 2021-02-17 NOTE — ED Provider Notes (Signed)
[facility] EMERGENCY DEPARTMENT Provider Note   CSN: [id number] Arrival date & time: 02/17/21  1026     History Chief Complaint  Patient presents with   Shortness of Breath    Thomas Robinson is a 60 y.o. male.   Shortness of Breath  Patient presents to the ER for evaluation of shortness of breath and chest discomfort.  Patient has a history of chronic kidney disease.  Is on dialysis Monday Wednesday Friday and last went to dialysis on Friday.  Patient states he woke up this morning after not sleeping well all last night because of some discomfort in the left side of his chest.  He also felt like he was having to constantly sit up to catch his breath.  He went out for dog walk and came back and noticed he felt very fatigued and short of breath.  Home health care nurse was checking on him because of recent surgery for an infection on his left arm where he had his AV fistula.  They noted his blood pressure was elevated around 200/100.  His oxygen saturation was also 76% on room air.  EMS was contacted.  They placed the patient on CPAP.  They also gave him nitroglycerin.  Patient now states he is having a headache that is 10 out of 10 on both sides.  Past Medical History:  Diagnosis Date   Anaphylactic shock, unspecified, sequela 06/10/2019   Arthritis    Asthma, chronic, unspecified asthma severity, with acute exacerbation 10/23/2017   CAP (community acquired pneumonia) 10/23/2017   Carpal tunnel syndrome of right wrist 10/05/2018   Cataract    right - removed by surgery   Cerebral infarction due to thrombosis of cerebral artery (HCC)    Cervical disc herniation 01/04/2020   Chronic diastolic heart failure ([place]) 04/13/2018   Chronic low back pain 11/24/2019   Constipation    Cough    chronic cough   Encounter for immunization 07/08/2017   ESRD on hemodialysis ([place]) 02/16/2018   Dialysis  T Thus Sat  - [facility]    Fall 06/04/2019   GERD (gastroesophageal  reflux disease) 06/04/2019   GI bleeding 05/24/2017   Gram-negative sepsis, unspecified ([place]) 09/05/2017   History of fusion of cervical spine 03/26/2020   Hyperlipidemia    Hypertension    Hypokalemia 06/04/2017   Iron deficiency anemia, unspecified 09/09/2017   Left shoulder pain 11/11/2019   Lumbar radiculopathy 11/24/2019   Lung contusion 06/04/2019   LVH (left ventricular hypertrophy) due to hypertensive disease, with heart failure ([place]) 06/18/2020   Macrocytic anemia 05/24/2017   Moderate protein-calorie malnutrition ([place]) 06/06/2017   Myofascial pain syndrome 06/12/2020   Neuritis of right ulnar nerve 09/13/2018   Non-compliance with renal dialysis ([place]) 02/08/2020   Oxygen deficiency 12/30/2019   O2 sats on RA 87% at PAT appt    Pain in joint of right elbow 09/13/2018   Pulmonary hypertension ([place])    Renovascular hypertension 06/22/2020   Restless leg syndrome    Rib fractures 06/2019   Right   S/P cardiac cath 08/27/2020   normal coronary arteries.   Secondary hyperparathyroidism of renal origin ([place]) 06/04/2017   Thoracic ascending aortic aneurysm (HCC)    4.5 cm 06/04/19 CT   Ulnar neuropathy 10/05/2018   Volume overload 10/23/2017    Patient Active Problem List   Diagnosis Date Noted   Cellulitis of arm, left 02/01/2021   Cellulitis of left lower limb 02/01/2021   Cellulitis,  unspecified 02/01/2021   Left arm swelling 01/25/2021   Acute on chronic respiratory failure ([place]) 01/13/2021   Acute on chronic respiratory failure with hypoxia ([place]) 01/12/2021   Concussion with no loss of consciousness 01/12/2021   Laceration without foreign body of left forearm, initial encounter 01/12/2021   Age-related physical debility 09/20/2020   Allergy, unspecified, initial encounter 09/20/2020   Nausea 09/20/2020   Pain, unspecified 09/20/2020   Pruritus, unspecified 09/20/2020   Acute encephalopathy 08/26/2020   Peripheral neuropathy 08/26/2020   Elevated troponin 08/25/2020   Hyperkalemia  08/05/2020   Arthritis    Asthma    Cataract    Cerebral infarction due to thrombosis of cerebral artery ([place])    History of CVA (cerebrovascular accident)    Chronic kidney disease    Dyspnea    Hyperlipidemia    Hypertension    Kidney failure    Restless leg syndrome    Degenerative lumbar spinal stenosis 46/50/3546   Diastolic dysfunction 56/81/2751   Renovascular hypertension 06/22/2020   Demand ischemia ([place]) 06/18/2020   LVH (left ventricular hypertrophy) due to hypertensive disease, with heart failure ([place]) 06/18/2020   Myofascial pain syndrome 06/12/2020   Encounter for removal of sutures 03/29/2020   History of fusion of cervical spine 03/26/2020   Non-compliance with renal dialysis ([place]) 02/08/2020   Acute pulmonary edema ([place]) 01/23/2020   Cervical disc herniation 01/04/2020   Tachycardia 01/04/2020   SOB (shortness of breath)    Oxygen deficiency 12/30/2019   Chronic low back pain 11/24/2019   Degeneration of lumbar intervertebral disc 11/24/2019   Lumbar radiculopathy 11/24/2019   Left shoulder pain 11/11/2019   Anaphylactic shock, unspecified, sequela 06/10/2019   Rib fractures 06/05/2019   Fall at home, initial encounter 06/04/2019   Right rib fracture 06/04/2019   Lung contusion 06/04/2019   Acute respiratory failure with hypoxia ([place]) 06/04/2019   Anemia in ESRD (end-stage renal disease) ([place]) 06/04/2019   GERD (gastroesophageal reflux disease) 06/04/2019   Chronic diastolic (congestive) heart failure ([place]) 06/04/2019   Fall 06/04/2019   Thoracic ascending aortic aneurysm ([place]) 06/04/2019   Acute exacerbation of congestive heart failure ([place]) 05/16/2019   Carpal tunnel syndrome of right wrist 10/05/2018   Ulnar neuropathy 10/05/2018   Neuritis of right ulnar nerve 09/13/2018   Pain in joint of right elbow 09/13/2018   Chest pain 04/13/2018   Chronic diastolic heart failure ([place]) 04/13/2018   Acute on chronic diastolic CHF (congestive heart failure)  ([place]) 04/13/2018   Chest discomfort    Atypical chest pain 02/16/2018   ESRD on dialysis ([place]) 02/16/2018   ESRD (end stage renal disease) on dialysis ([place]) 02/16/2018   Volume overload 10/23/2017   CAP (community acquired pneumonia) 10/23/2017   Asthma, chronic, unspecified asthma severity, with acute exacerbation 10/23/2017   Respiratory failure, acute ([place]) 10/23/2017   Iron deficiency anemia, unspecified 09/09/2017   Gram-negative sepsis, unspecified ([place]) 09/05/2017   Cough 08/23/2017   Encounter for immunization 07/08/2017   Moderate protein-calorie malnutrition ([place]) 06/06/2017   Coagulation defect, unspecified ([place]) 06/04/2017   Hypokalemia 06/04/2017   Other hyperlipidemia 06/04/2017   Secondary hyperparathyroidism of renal origin ([place]) 06/04/2017   Constipation    Macrocytic anemia 05/24/2017   Uremia 05/24/2017   Hypertensive urgency 05/24/2017   Acute kidney injury superimposed on CKD ([place]) 05/24/2017   CKD (chronic kidney disease), stage V ([place]) 05/24/2017   GI bleeding 05/24/2017   Symptomatic anemia 05/24/2017   Stroke ([place]) 2018   AKI (acute kidney injury) (  [place]) 04/10/2015   History of completed stroke    Essential hypertension 04/09/2015    Past Surgical History:  Procedure Laterality Date   A/V FISTULAGRAM N/A 01/01/2021   Procedure: A/V FISTULAGRAM;  Surgeon: Serafina Mitchell, MD;  Location: [place] [facility];  Service: Cardiovascular;  Laterality: N/A;   A/V FISTULAGRAM N/A 01/15/2021   Procedure: A/V IRSWNIOEVOJ;  Surgeon: Serafina Mitchell, MD;  Location: [place] [facility];  Service: Cardiovascular;  Laterality: N/A;   ANTERIOR CERVICAL DECOMP/DISCECTOMY FUSION N/A 01/04/2020   Procedure: ANTERIOR CERVICAL DECOMPRESSION/DISCECTOMY FUSION CERVICAL FIVE THROUGH SEVEN;  Surgeon: Melina Schools, MD;  Location: [place];  Service: Orthopedics;  Laterality: N/A;  3 hrs   AV FISTULA PLACEMENT Left 05/28/2017   Procedure: LEFT ARM ARTERIOVENOUS (AV) FISTULA CREATION;   Surgeon: Conrad Hudson, MD;  Location: [place];  Service: Vascular;  Laterality: Left;   AV FISTULA PLACEMENT Left 02/02/2021   Procedure: Debride left forearm, ligation of left upper arm Aretiovenous fistula;  Surgeon: Elam Dutch, MD;  Location: [facility] OR;  Service: Vascular;  Laterality: Left;   EYE SURGERY Right 06/02/2019   Cataract removed   FRACTURE SURGERY     INSERTION OF DIALYSIS CATHETER Right 02/02/2021   Procedure: INSERTION OF Right Internal jugular TUNNELED  DIALYSIS CATHETER;  Surgeon: Elam Dutch, MD;  Location: [place];  Service: Vascular;  Laterality: Right;   IR DIALY SHUNT INTRO NEEDLE/INTRACATH INITIAL W/IMG LEFT Left 09/12/2020   IR FLUORO GUIDE CV LINE RIGHT  05/25/2017   IR US GUIDE VASC ACCESS LEFT  09/12/2020   IR US GUIDE VASC ACCESS RIGHT  05/25/2017   LIGATION OF ARTERIOVENOUS  FISTULA Left 01/10/2021   Procedure: LIGATION OF LEFT ARM RADIOCEPHALIC FISTULA;  Surgeon: Serafina Mitchell, MD;  Location: [place];  Service: Vascular;  Laterality: Left;   PERIPHERAL VASCULAR BALLOON ANGIOPLASTY Left 01/15/2021   Procedure: PERIPHERAL VASCULAR BALLOON ANGIOPLASTY;  Surgeon: Serafina Mitchell, MD;  Location: [place] [facility];  Service: Cardiovascular;  Laterality: Left;  AVF   REVISON OF ARTERIOVENOUS FISTULA Left 07/18/2019   Procedure: REVISION PLICATION OF RADIOCEPHALIC ARTERIOVENOUS FISTULA LEFT ARM;  Surgeon: Angelia Mould, MD;  Location: [facility] OR;  Service: Vascular;  Laterality: Left;   REVISON OF ARTERIOVENOUS FISTULA Left 01/10/2021   Procedure: CONVERSION TO BRACHIOCEPHALIC ARTERIOVENOUS FISTULA;  Surgeon: Serafina Mitchell, MD;  Location: [facility] OR;  Service: Vascular;  Laterality: Left;   RINOPLASTY         Family History  Problem Relation Age of Onset   Cancer Mother     Social History   Tobacco Use   Smoking status: Never   Smokeless tobacco: Never  Vaping Use   Vaping Use: Never used  Substance Use Topics   Alcohol use: Not Currently     Alcohol/week: 0.0 standard drinks    Comment: has a drink once in a while- 12/30/19   Drug use: No    Home Medications Prior to Admission medications   Medication Sig Start Date End Date Taking? Authorizing Provider  acetaminophen (TYLENOL) 500 MG tablet Take 500 mg by mouth every 6 (six) hours as needed for moderate pain or headache.    [provider]  albuterol (VENTOLIN HFA) 108 (90 Base) MCG/ACT inhaler Inhale 2 puffs into the lungs in the morning and at bedtime.    [provider]  amLODipine (NORVASC) 10 MG tablet Take 10 mg by mouth daily.     [provider]  BREO ELLIPTA 100-25 MCG/INH AEPB Inhale 1 puff into the lungs 2 (two) times daily as needed (sob/wheezing). 06/26/20   [provider]  cholecalciferol (VITAMIN D3) 25 MCG (1000 UNIT) tablet Take 1,000 Units by mouth once a week.    [provider]  DIPHENHYDRAMINE HCL PO Take 25 mg by mouth as needed (allergies). 09/24/20 09/23/21  [provider]  doxercalciferol (HECTOROL) 0.5 MCG capsule Take 0.5 mcg by mouth 3 (three) times a week. 11/26/20 11/25/21  [provider]  furosemide (LASIX) 80 MG tablet Take 80 mg by mouth as directed. Take Daily PRN On Non-Dialysis Days (Sun, Tues, Thurs, Sat)    [provider]  hydrALAZINE (APRESOLINE) 25 MG tablet Take 50 mg by mouth in the morning and at bedtime.    [provider]  metoprolol tartrate (LOPRESSOR) 100 MG tablet Take 0.5 tablets (50 mg total) by mouth 2 (two) times daily. Patient taking differently: Take 100 mg by mouth daily. 01/15/21   Alma Friendly, MD  multivitamin (RENA-VIT) TABS tablet Take 1 tablet by mouth daily. 01/12/18   [provider]  pregabalin (LYRICA) 25 MG capsule Take 25 mg by mouth daily as needed (pain). 11/29/20   [provider]  sevelamer carbonate (RENVELA) 800 MG tablet Take 800-1,600 mg by mouth daily as needed (with snacks or meals). With Snacks & Meals  11/07/19   [provider]  traMADol (ULTRAM) 50 MG tablet Take 1 tablet (50 mg total) by mouth every 6 (six) hours as needed for moderate pain. 01/25/21   Ulyses Amor, PA-C    Allergies    Patient has no known allergies.  Review of Systems   Review of Systems  Respiratory:  Positive for shortness of breath.   Musculoskeletal:  Positive for back pain.       Chronic back pain, getting worse, patient is scheduled for surgery  All other systems reviewed and are negative.  Physical Exam Updated Vital Signs BP (!) 170/96 (BP Location: Right Arm)   Pulse 69   Temp 98.6 F (37 C) (Oral)   Resp (!) 21   Ht 1.727 m (5\' 8" )   Wt 56 kg   SpO2 96%   BMI 18.77 kg/m   Physical Exam Vitals and nursing note reviewed.  Constitutional:      Appearance: He is well-developed. He is ill-appearing.  HENT:     Head: Normocephalic and atraumatic.     Right Ear: External ear normal.     Left Ear: External ear normal.  Eyes:     General: No scleral icterus.       Right eye: No discharge.        Left eye: No discharge.     Conjunctiva/sclera: Conjunctivae normal.  Neck:     Trachea: No tracheal deviation.  Cardiovascular:     Rate and Rhythm: Normal rate and regular rhythm.  Pulmonary:     Effort: Pulmonary effort is normal. No respiratory distress.     Breath sounds: Normal breath sounds. No stridor. No decreased breath sounds, wheezing or rales.  Chest:     Comments: Vascular access line in right upper chest, no surrounding erythema or edema Abdominal:     General: Bowel sounds are normal. There is no distension.     Palpations: Abdomen is soft.     Tenderness: There is no abdominal tenderness. There is no guarding or rebound.  Musculoskeletal:        General: No tenderness or deformity.  Cervical back: Neck supple.     Comments: Mild edema bilateral lower extremities, left upper extremity with dressing in place, no surrounding erythema  Skin:    General: Skin is warm  and dry.     Findings: No rash.  Neurological:     General: No focal deficit present.     Mental Status: He is alert.     Cranial Nerves: No cranial nerve deficit (no facial droop, extraocular movements intact, no slurred speech).     Sensory: No sensory deficit.     Motor: No abnormal muscle tone or seizure activity.     Coordination: Coordination normal.  Psychiatric:        Mood and Affect: Mood normal.    ED Results / Procedures / Treatments   Labs (all labs ordered are listed, but only abnormal results are displayed) Labs Reviewed  RESP PANEL BY RT-PCR (FLU A&B, COVID) [id number]  BASIC METABOLIC PANEL  CBC  CBG MONITORING, ED  TROPONIN I (HIGH SENSITIVITY)    EKG EKG Interpretation  Date/Time:  Sunday February 17 2021 10:28:50 EDT Ventricular Rate:  78 PR Interval:  157 QRS Duration: 86 QT Interval:  431 QTC Calculation: 491 R Axis:   55 Text Interpretation: Sinus rhythm LVH with secondary repolarization abnormality Borderline prolonged QT interval No significant change since last tracing Confirmed by Dorie Rank [phone]) on 02/17/2021 10:40:11 AM  Radiology No results found.  Procedures Procedures   Medications Ordered in ED Medications  aspirin chewable tablet 324 mg (has no administration in time range)  morphine 4 MG/ML injection 4 mg (has no administration in time range)    ED Course  I have reviewed the triage vital signs and the nursing notes.  Pertinent labs & imaging results that were available during my care of the patient were reviewed by me and considered in my medical decision making (see chart for details).  Clinical Course as of 02/19/21 0820  Sun Feb 17, 2021  1145 Hemoglobin stable at 8 [JK]  1145 Renal function consistent with his known chronic kidney disease [JK]  1145 Covid and flu are negative [JK]  1145 Initial troponin elevated at 51 but similar to previous values [JK]  1146 Chest x-ray with possible pneumonia [JK]    Clinical Course User  Index [JK] Dorie Rank, MD   MDM Rules/Calculators/A&P                          Pt with multiple comorbidities. Presented with shortness of breath, chest pain..  New oxygen requirement.  No signs of pulm edema fluid overload.  Trop elevated but similar to previous values.  Doubt ACS.  CXR with possible pna.  COvid negative.  Started on Iv abx.  Admit for further treatment. Final Clinical Impression(s) / ED Diagnoses Final diagnoses:  Community acquired pneumonia, unspecified laterality  Hypoxia  Chronic kidney disease, unspecified CKD stage    Rx / DC Orders ED Discharge Orders     None        Dorie Rank, MD 02/19/21 [phone]

## 2021-02-17 NOTE — ED Triage Notes (Addendum)
Patient BIB [facility] from home. Pt was being evaluated by [facility] RN for a wound check to the left arm when she noticed patient's blood pressure was elevated around 200/100 and SpO2 was 76% on RA. RN called EMS. EMS observed the same and placed patient on CPAP after noticing diminished lung sounds in lower lobes. Pt satting 100% on CPAP. Pt tx with 4 NTG SL. Pt complaining of headache intermittently frontally and to the sides rating it 10/10. Pt complaining of chronic back pain as well of just feeling "generally weak". Pt tachypneic with EMS but much improved upon arrival. Pt weaned off CPAP remaining 100% on 2 L [place]. VSS. NAD at this time.

## 2021-02-18 ENCOUNTER — Inpatient Hospital Stay (HOSPITAL_COMMUNITY)
Admission: EM | Admit: 2021-02-18 | Discharge: 2021-02-18 | DRG: 291 | Discharge status: Against Medical Advice | Payer: Medicare Other | Attending: Internal Medicine | Admitting: Internal Medicine

## 2021-02-18 ENCOUNTER — Encounter (HOSPITAL_COMMUNITY): Payer: Self-pay | Admitting: *Deleted

## 2021-02-18 ENCOUNTER — Encounter: Payer: Medicare Other | Admitting: Surgery

## 2021-02-18 ENCOUNTER — Ambulatory Visit (HOSPITAL_COMMUNITY): Payer: Medicare Other

## 2021-02-18 ENCOUNTER — Emergency Department (HOSPITAL_COMMUNITY): Payer: Medicare Other

## 2021-02-18 DIAGNOSIS — Z992 Dependence on renal dialysis: Secondary | ICD-10-CM

## 2021-02-18 DIAGNOSIS — J9601 Acute respiratory failure with hypoxia: Secondary | ICD-10-CM | POA: Diagnosis present

## 2021-02-18 DIAGNOSIS — Z87892 Personal history of anaphylaxis: Secondary | ICD-10-CM | POA: Diagnosis not present

## 2021-02-18 DIAGNOSIS — Z20822 Contact with and (suspected) exposure to covid-19: Secondary | ICD-10-CM | POA: Diagnosis present

## 2021-02-18 DIAGNOSIS — K219 Gastro-esophageal reflux disease without esophagitis: Secondary | ICD-10-CM | POA: Diagnosis present

## 2021-02-18 DIAGNOSIS — E875 Hyperkalemia: Secondary | ICD-10-CM | POA: Diagnosis present

## 2021-02-18 DIAGNOSIS — N2581 Secondary hyperparathyroidism of renal origin: Secondary | ICD-10-CM | POA: Diagnosis present

## 2021-02-18 DIAGNOSIS — Z5329 Procedure and treatment not carried out because of patient's decision for other reasons: Secondary | ICD-10-CM | POA: Diagnosis present

## 2021-02-18 DIAGNOSIS — N186 End stage renal disease: Secondary | ICD-10-CM

## 2021-02-18 DIAGNOSIS — I10 Essential (primary) hypertension: Secondary | ICD-10-CM | POA: Diagnosis present

## 2021-02-18 DIAGNOSIS — D638 Anemia in other chronic diseases classified elsewhere: Secondary | ICD-10-CM | POA: Diagnosis not present

## 2021-02-18 DIAGNOSIS — Z981 Arthrodesis status: Secondary | ICD-10-CM

## 2021-02-18 DIAGNOSIS — N19 Unspecified kidney failure: Secondary | ICD-10-CM

## 2021-02-18 DIAGNOSIS — I712 Thoracic aortic aneurysm, without rupture: Secondary | ICD-10-CM | POA: Diagnosis present

## 2021-02-18 DIAGNOSIS — J810 Acute pulmonary edema: Secondary | ICD-10-CM | POA: Diagnosis not present

## 2021-02-18 DIAGNOSIS — D631 Anemia in chronic kidney disease: Secondary | ICD-10-CM | POA: Diagnosis present

## 2021-02-18 DIAGNOSIS — J189 Pneumonia, unspecified organism: Secondary | ICD-10-CM

## 2021-02-18 DIAGNOSIS — I5033 Acute on chronic diastolic (congestive) heart failure: Secondary | ICD-10-CM | POA: Diagnosis present

## 2021-02-18 DIAGNOSIS — Z9115 Patient's noncompliance with renal dialysis: Secondary | ICD-10-CM

## 2021-02-18 DIAGNOSIS — R778 Other specified abnormalities of plasma proteins: Secondary | ICD-10-CM

## 2021-02-18 DIAGNOSIS — Z8673 Personal history of transient ischemic attack (TIA), and cerebral infarction without residual deficits: Secondary | ICD-10-CM | POA: Diagnosis not present

## 2021-02-18 DIAGNOSIS — W19XXXD Unspecified fall, subsequent encounter: Secondary | ICD-10-CM

## 2021-02-18 DIAGNOSIS — G2581 Restless legs syndrome: Secondary | ICD-10-CM | POA: Diagnosis present

## 2021-02-18 DIAGNOSIS — R0603 Acute respiratory distress: Secondary | ICD-10-CM

## 2021-02-18 DIAGNOSIS — I132 Hypertensive heart and chronic kidney disease with heart failure and with stage 5 chronic kidney disease, or end stage renal disease: Secondary | ICD-10-CM | POA: Diagnosis present

## 2021-02-18 DIAGNOSIS — J811 Chronic pulmonary edema: Secondary | ICD-10-CM | POA: Diagnosis present

## 2021-02-18 LAB — I-STAT ARTERIAL BLOOD GAS, ED
Acid-base deficit: 1 mmol/L (ref 0.0–2.0)
Bicarbonate: 22.5 mmol/L (ref 20.0–28.0)
Calcium, Ion: 1.18 mmol/L (ref 1.15–1.40)
HCT: 22 % — ABNORMAL LOW (ref 39.0–52.0)
Hemoglobin: 7.5 g/dL — ABNORMAL LOW (ref 13.0–17.0)
O2 Saturation: 100 %
Patient temperature: 98.6
Potassium: 5.9 mmol/L — ABNORMAL HIGH (ref 3.5–5.1)
Sodium: 136 mmol/L (ref 135–145)
TCO2: 24 mmol/L (ref 22–32)
pCO2 arterial: 32.9 mmHg (ref 32.0–48.0)
pH, Arterial: 7.444 (ref 7.350–7.450)
pO2, Arterial: 208 mmHg — ABNORMAL HIGH (ref 83.0–108.0)

## 2021-02-18 LAB — RESP PANEL BY RT-PCR (FLU A&B, COVID) ARPGX2
Influenza A by PCR: NEGATIVE
Influenza B by PCR: NEGATIVE
SARS Coronavirus 2 by RT PCR: NEGATIVE

## 2021-02-18 LAB — CBC WITH DIFFERENTIAL/PLATELET
Abs Immature Granulocytes: 0.03 10*3/uL (ref 0.00–0.07)
Basophils Absolute: 0.1 10*3/uL (ref 0.0–0.1)
Basophils Relative: 1 %
Eosinophils Absolute: 0.2 10*3/uL (ref 0.0–0.5)
Eosinophils Relative: 4 %
HCT: 23 % — ABNORMAL LOW (ref 39.0–52.0)
Hemoglobin: 7.3 g/dL — ABNORMAL LOW (ref 13.0–17.0)
Immature Granulocytes: 1 %
Lymphocytes Relative: 6 %
Lymphs Abs: 0.3 10*3/uL — ABNORMAL LOW (ref 0.7–4.0)
MCH: 32 pg (ref 26.0–34.0)
MCHC: 31.7 g/dL (ref 30.0–36.0)
MCV: 100.9 fL — ABNORMAL HIGH (ref 80.0–100.0)
Monocytes Absolute: 0.2 10*3/uL (ref 0.1–1.0)
Monocytes Relative: 3 %
Neutro Abs: 4.7 10*3/uL (ref 1.7–7.7)
Neutrophils Relative %: 85 %
Platelets: 149 10*3/uL — ABNORMAL LOW (ref 150–400)
RBC: 2.28 MIL/uL — ABNORMAL LOW (ref 4.22–5.81)
RDW: 14.2 % (ref 11.5–15.5)
WBC: 5.5 10*3/uL (ref 4.0–10.5)
nRBC: 0 % (ref 0.0–0.2)

## 2021-02-18 LAB — COMPREHENSIVE METABOLIC PANEL
ALT: 9 U/L (ref 0–44)
AST: 14 U/L — ABNORMAL LOW (ref 15–41)
Albumin: 2.9 g/dL — ABNORMAL LOW (ref 3.5–5.0)
Alkaline Phosphatase: 84 U/L (ref 38–126)
Anion gap: 17 — ABNORMAL HIGH (ref 5–15)
BUN: 49 mg/dL — ABNORMAL HIGH (ref 8–23)
CO2: 23 mmol/L (ref 22–32)
Calcium: 9.4 mg/dL (ref 8.9–10.3)
Chloride: 98 mmol/L (ref 98–111)
Creatinine, Ser: 7.86 mg/dL — ABNORMAL HIGH (ref 0.61–1.24)
GFR, Estimated: 7 mL/min — ABNORMAL LOW (ref >60)
Glucose, Bld: 89 mg/dL (ref 70–99)
Potassium: 5.7 mmol/L — ABNORMAL HIGH (ref 3.5–5.1)
Sodium: 138 mmol/L (ref 135–145)
Total Bilirubin: 1.6 mg/dL — ABNORMAL HIGH (ref 0.3–1.2)
Total Protein: 6.4 g/dL — ABNORMAL LOW (ref 6.5–8.1)

## 2021-02-18 LAB — I-STAT CHEM 8, ED
BUN: 63 mg/dL — ABNORMAL HIGH (ref 8–23)
Calcium, Ion: 1.12 mmol/L — ABNORMAL LOW (ref 1.15–1.40)
Chloride: 103 mmol/L (ref 98–111)
Creatinine, Ser: 8.3 mg/dL — ABNORMAL HIGH (ref 0.61–1.24)
Glucose, Bld: 84 mg/dL (ref 70–99)
HCT: 22 % — ABNORMAL LOW (ref 39.0–52.0)
Hemoglobin: 7.5 g/dL — ABNORMAL LOW (ref 13.0–17.0)
Potassium: 6.3 mmol/L (ref 3.5–5.1)
Sodium: 136 mmol/L (ref 135–145)
TCO2: 25 mmol/L (ref 22–32)

## 2021-02-18 LAB — TROPONIN I (HIGH SENSITIVITY)
Troponin I (High Sensitivity): 47 ng/L — ABNORMAL HIGH (ref <18)
Troponin I (High Sensitivity): 47 ng/L — ABNORMAL HIGH (ref <18)

## 2021-02-18 LAB — POC OCCULT BLOOD, ED: Fecal Occult Bld: NEGATIVE

## 2021-02-18 LAB — HEPATITIS B SURFACE ANTIGEN: Hepatitis B Surface Ag: NONREACTIVE

## 2021-02-18 LAB — POTASSIUM: Potassium: 5.9 mmol/L — ABNORMAL HIGH (ref 3.5–5.1)

## 2021-02-18 LAB — BRAIN NATRIURETIC PEPTIDE: B Natriuretic Peptide: 4369.1 pg/mL — ABNORMAL HIGH (ref 0.0–100.0)

## 2021-02-18 IMAGING — DX DG CHEST 1V PORT
1 series · 1 of 1 positions shown · non-contrast
Comparison: Two-view chest x-ray 06/11/2020

CLINICAL DATA: Shortness of breath. End-stage renal disease. Need
for emergent dialysis.

EXAM:
PORTABLE CHEST 1 VIEW

[chest ap]
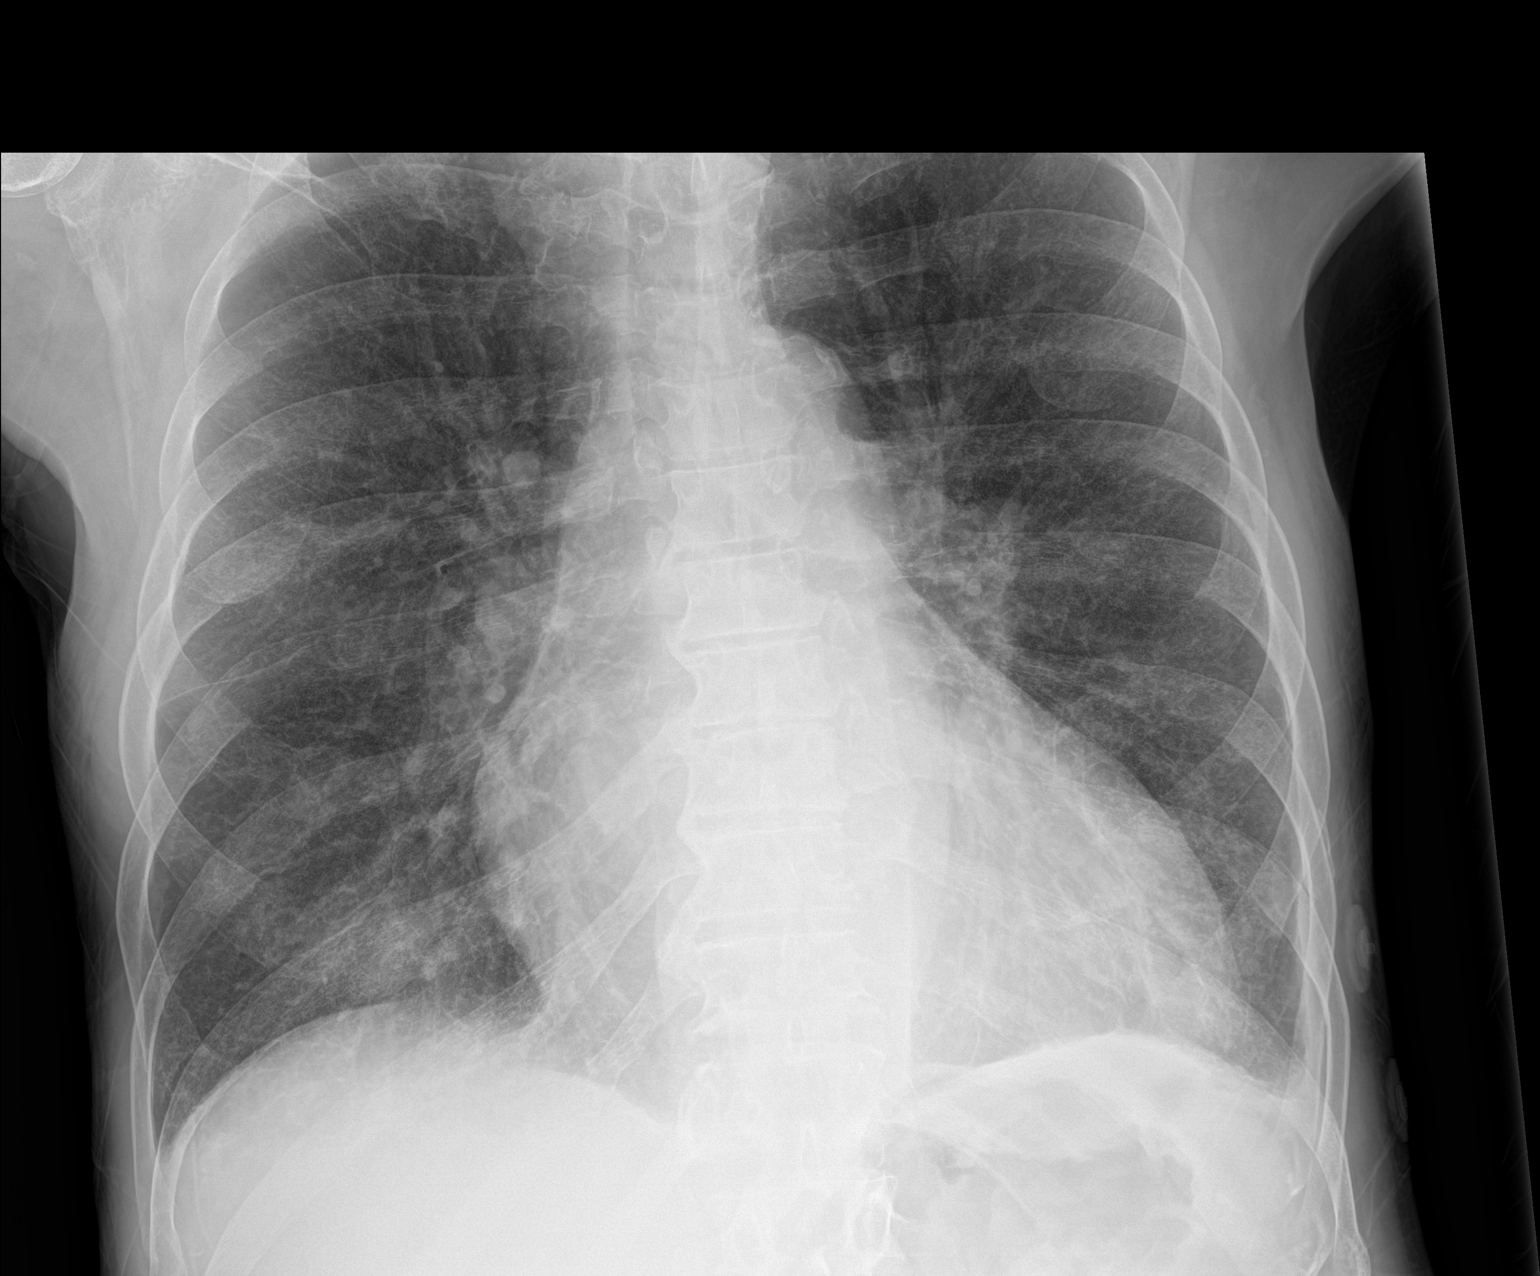

[1 of 1 positions shown; findings below may reference images not displayed]

FINDINGS: Heart is enlarged. Atherosclerotic changes are noted at the aortic
arch. Patchy airspace opacities are now present at the lung bases,
right greater than left. Moderate vascular congestion is noted.
IMPRESSION: 1. Cardiomegaly and pulmonary vascular congestion are compatible
with congestive heart failure.
2. Patchy airspace disease at the lung bases, right greater than
left. This may represent atelectasis, infection is not excluded.

## 2021-02-18 MED ORDER — DOXERCALCIFEROL 0.5 MCG PO CAPS
0.5000 ug | ORAL_CAPSULE | ORAL | Status: DC
  Administered 2021-02-18: 0.5 ug via ORAL
  Filled 2021-02-18: qty 1

## 2021-02-18 MED ORDER — VANCOMYCIN HCL 1250 MG/250ML IV SOLN
1250.0000 mg | Freq: Once | INTRAVENOUS | Status: AC
  Administered 2021-02-18: 1250 mg via INTRAVENOUS
  Filled 2021-02-18: qty 250

## 2021-02-18 MED ORDER — SEVELAMER CARBONATE 800 MG PO TABS: 800.0000 mg | ORAL_TABLET | ORAL | Status: DC | PRN

## 2021-02-18 MED ORDER — CHLORHEXIDINE GLUCONATE CLOTH 2 % EX PADS: 6.0000 | MEDICATED_PAD | Freq: Every day | CUTANEOUS | Status: DC

## 2021-02-18 MED ORDER — IPRATROPIUM-ALBUTEROL 0.5-2.5 (3) MG/3ML IN SOLN: 3.0000 mL | RESPIRATORY_TRACT | Status: DC | PRN

## 2021-02-18 MED ORDER — HEPARIN SODIUM (PORCINE) 1000 UNIT/ML IJ SOLN
INTRAMUSCULAR | Status: AC
  Administered 2021-02-18: 3200 [IU] via INTRAVENOUS
  Filled 2021-02-18: qty 4

## 2021-02-18 MED ORDER — SODIUM CHLORIDE 0.9 % IV SOLN: 1.0000 g | INTRAVENOUS | Status: DC

## 2021-02-18 MED ORDER — SODIUM BICARBONATE 8.4 % IV SOLN
50.0000 meq | Freq: Once | INTRAVENOUS | Status: DC
  Administered 2021-02-18: 50 meq via INTRAVENOUS
  Filled 2021-02-18: qty 50

## 2021-02-18 MED ORDER — DARBEPOETIN ALFA 200 MCG/0.4ML IJ SOSY
200.0000 ug | PREFILLED_SYRINGE | INTRAMUSCULAR | Status: DC
  Administered 2021-02-18: 200 ug via INTRAVENOUS

## 2021-02-18 MED ORDER — HEPARIN SODIUM (PORCINE) 1000 UNIT/ML IJ SOLN
1000.0000 [IU] | INTRAMUSCULAR | Status: DC | PRN
  Filled 2021-02-18: qty 1

## 2021-02-18 MED ORDER — SODIUM CHLORIDE 0.9 % IV SOLN
2.0000 g | Freq: Once | INTRAVENOUS | Status: AC
  Administered 2021-02-18: 2 g via INTRAVENOUS
  Filled 2021-02-18: qty 2

## 2021-02-18 MED ORDER — HEPARIN SODIUM (PORCINE) 5000 UNIT/ML IJ SOLN: 5000.0000 [IU] | Freq: Three times a day (TID) | INTRAMUSCULAR | Status: DC

## 2021-02-18 MED ORDER — PREGABALIN 25 MG PO CAPS: 25.0000 mg | ORAL_CAPSULE | Freq: Every day | ORAL | Status: DC | PRN

## 2021-02-18 MED ORDER — METOPROLOL TARTRATE 25 MG PO TABS
50.0000 mg | ORAL_TABLET | Freq: Two times a day (BID) | ORAL | Status: DC
  Administered 2021-02-18: 50 mg via ORAL
  Filled 2021-02-18: qty 2

## 2021-02-18 MED ORDER — AMLODIPINE BESYLATE 5 MG PO TABS: 10.0000 mg | ORAL_TABLET | Freq: Every day | ORAL | Status: DC

## 2021-02-18 MED ORDER — HEPARIN SODIUM (PORCINE) 1000 UNIT/ML DIALYSIS
2000.0000 [IU] | Freq: Once | INTRAMUSCULAR | Status: AC
  Filled 2021-02-18: qty 2

## 2021-02-18 MED ORDER — DARBEPOETIN ALFA 200 MCG/0.4ML IJ SOSY
PREFILLED_SYRINGE | INTRAMUSCULAR | Status: AC
  Filled 2021-02-18: qty 0.4

## 2021-02-18 MED ORDER — HEPARIN SODIUM (PORCINE) 1000 UNIT/ML IJ SOLN
INTRAMUSCULAR | Status: AC
  Administered 2021-02-18: 2000 [IU] via INTRAVENOUS_CENTRAL
  Filled 2021-02-18: qty 2

## 2021-02-18 MED ORDER — HYDRALAZINE HCL 25 MG PO TABS: 50.0000 mg | ORAL_TABLET | Freq: Two times a day (BID) | ORAL | Status: DC

## 2021-02-18 MED ORDER — SODIUM ZIRCONIUM CYCLOSILICATE 10 G PO PACK
10.0000 g | PACK | Freq: Once | ORAL | Status: AC
  Administered 2021-02-18: 10 g via ORAL
  Filled 2021-02-18: qty 1

## 2021-02-18 MED ORDER — FLUTICASONE FUROATE-VILANTEROL 100-25 MCG/INH IN AEPB: 1.0000 | INHALATION_SPRAY | Freq: Two times a day (BID) | RESPIRATORY_TRACT | Status: DC | PRN

## 2021-02-18 MED ORDER — ALBUTEROL SULFATE (2.5 MG/3ML) 0.083% IN NEBU
10.0000 mg | INHALATION_SOLUTION | Freq: Once | RESPIRATORY_TRACT | Status: AC
  Administered 2021-02-18: 10 mg via RESPIRATORY_TRACT
  Filled 2021-02-18: qty 12

## 2021-02-18 NOTE — Progress Notes (Signed)
Patient came in via EMS wearing bipap. Patient stated he was breathing better and placed patient on 4lpm cannula with Sp02=97%, B/S clear.

## 2021-02-18 NOTE — Progress Notes (Signed)
RT to to hold off on bipap for now. pt. At this time. Pt. Tolerating much better and maintaining on 3L nasal cannula with no distress. Also per MD.

## 2021-02-18 NOTE — Progress Notes (Signed)
Pharmacy Antibiotic Note  Thomas Robinson is a 60 y.o. male admitted on 02/18/2021 with pneumonia.  Pharmacy has been consulted for Cefepime dosing. Left AMA yesterday. WBC WNL. ESRD on HD MWF.   Plan: Cefepime 1g IV q24h Trend WBC, temp F/U infectious work-up  Temp (24hrs), Avg:98.7 F (37.1 C), Min:98.6 F (37 C), Max:98.7 F (37.1 C)  Recent Labs  Lab 02/14/21 2208 02/14/21 2225 02/17/21 1045 02/18/21 0107 02/18/21 0202  WBC 5.8  --  5.8 5.5  --   CREATININE 8.56* 9.20* 6.96*  --  8.30*    Estimated Creatinine Clearance: 7.4 mL/min (A) (by C-G formula based on SCr of 8.3 mg/dL (H)).    No Known Allergies  Narda Bonds, PharmD, BCPS Clinical Pharmacist Phone: [phone]

## 2021-02-18 NOTE — Procedures (Signed)
I was present at this dialysis session. I have reviewed the session itself and made appropriate changes.   Patient was seen yesterday in the ED for hypoxic respiratory failure by Dr. Jonnie Finner.  He left AMA and then returned later overnight with recurrent acute dyspnea requiring noninvasive positive pressure ventilation.  Patient is afebrile.  Blood pressures are stable.  Currently on 3 L nasal cannula.  Potassium is 5.9 and ABG earlier this morning 7.4 4/33/208.  Hemoglobin is 7.5.  Patient seen on dialysis, receiving a 1K bath for the first 60 minutes, then 2K for the remainder of the treatment.  UF goal 4 L.  TDC with a BFR of 400.  Tolerating therapy well.   States dyspnea is markedly improved.  Lower EDW at DC.    Will give ESA today.    Recent Labs  Lab 02/18/21 0107 02/18/21 0202 02/18/21 0251 02/18/21 0449  NA 138 136  --  136  K 5.7* 6.3*   < > 5.9*  CL 98 103  --   --   CO2 23  --   --   --   GLUCOSE 89 84  --   --   BUN 49* 63*  --   --   CREATININE 7.86* 8.30*  --   --   CALCIUM 9.4  --   --   --    < > = values in this interval not displayed.    Recent Labs  Lab 02/14/21 2208 02/14/21 2225 02/17/21 1045 02/18/21 0107 02/18/21 0202 02/18/21 0449  WBC 5.8  --  5.8 5.5  --   --   NEUTROABS 4.6  --   --  4.7  --   --   HGB 8.4*   < > 8.0* 7.3* 7.5* 7.5*  HCT 26.4*   < > 25.2* 23.0* 22.0* 22.0*  MCV 101.1*  --  102.9* 100.9*  --   --   PLT 197  --  166 149*  --   --    < > = values in this interval not displayed.    Scheduled Meds:  amLODipine  10 mg Oral Daily   Chlorhexidine Gluconate Cloth  6 each Topical Q0600   doxercalciferol  0.5 mcg Oral Q M,W,F-HD   heparin  5,000 Units Subcutaneous Q8H   hydrALAZINE  50 mg Oral BID   metoprolol tartrate  50 mg Oral BID   Continuous Infusions: PRN Meds:.fluticasone furoate-vilanterol, ipratropium-albuterol, pregabalin, sevelamer carbonate   Pearson Grippe  MD 02/18/2021, 8:21 AM

## 2021-02-18 NOTE — ED Notes (Signed)
Call to RT for stat ABG

## 2021-02-18 NOTE — ED Notes (Signed)
RT notified that Bipap was ordered.  Pt has been having increasing sob.  [place] at 3L initially,then bumped to 4L and then to NRB 15L.  Await RT to place pt on bipap at this time.

## 2021-02-18 NOTE — ED Notes (Signed)
RT at the bedside.

## 2021-02-18 NOTE — ED Notes (Signed)
Pt called for nursing staff, [place] was off and pt spo2 85%.  Placed [place] back on pt and pt spo2 increased to high 80's.  Increased [place] to 4L [place] at this time

## 2021-02-18 NOTE — ED Provider Notes (Addendum)
[facility] EMERGENCY DEPARTMENT Provider Note   CSN: [id number] Arrival date & time: 02/18/21  7062     History Chief Complaint  Patient presents with   Respiratory Distress    Thomas Robinson is a 60 y.o. male.  The history is provided by the patient, medical records and the EMS personnel. The history is limited by the condition of the patient.  Shortness of Breath Severity:  Severe Onset quality:  Gradual Duration:  1 day Timing:  Constant Progression:  Worsening Chronicity:  Recurrent Context: not animal exposure and not emotional upset   Relieved by:  Nothing Worsened by:  Nothing Ineffective treatments:  None tried Associated symptoms: no fever, no hemoptysis, no rash and no vomiting   Associated symptoms comment:  Global weakness with multiple falls  Risk factors: no obesity   Patient was admitted by Dr. Tomi Bamberger this am and reportedly discharged to home where he was short of breath and had several falls.  EMS called for falls and SOB. CPAP en route.      Past Medical History:  Diagnosis Date   Anaphylactic shock, unspecified, sequela 06/10/2019   Arthritis    Asthma, chronic, unspecified asthma severity, with acute exacerbation 10/23/2017   CAP (community acquired pneumonia) 10/23/2017   Carpal tunnel syndrome of right wrist 10/05/2018   Cataract    right - removed by surgery   Cerebral infarction due to thrombosis of cerebral artery (HCC)    Cervical disc herniation 01/04/2020   Chronic diastolic heart failure ([place]) 04/13/2018   Chronic low back pain 11/24/2019   Constipation    Cough    chronic cough   Encounter for immunization 07/08/2017   ESRD on hemodialysis ([place]) 02/16/2018   Dialysis  T Thus Sat  - [facility]    Fall 06/04/2019   GERD (gastroesophageal reflux disease) 06/04/2019   GI bleeding 05/24/2017   Gram-negative sepsis, unspecified ([place]) 09/05/2017   History of fusion of cervical spine 03/26/2020   Hyperlipidemia     Hypertension    Hypokalemia 06/04/2017   Iron deficiency anemia, unspecified 09/09/2017   Left shoulder pain 11/11/2019   Lumbar radiculopathy 11/24/2019   Lung contusion 06/04/2019   LVH (left ventricular hypertrophy) due to hypertensive disease, with heart failure ([place]) 06/18/2020   Macrocytic anemia 05/24/2017   Moderate protein-calorie malnutrition ([place]) 06/06/2017   Myofascial pain syndrome 06/12/2020   Neuritis of right ulnar nerve 09/13/2018   Non-compliance with renal dialysis ([place]) 02/08/2020   Oxygen deficiency 12/30/2019   O2 sats on RA 87% at PAT appt    Pain in joint of right elbow 09/13/2018   Pulmonary hypertension ([place])    Renovascular hypertension 06/22/2020   Restless leg syndrome    Rib fractures 06/2019   Right   S/P cardiac cath 08/27/2020   normal coronary arteries.   Secondary hyperparathyroidism of renal origin ([place]) 06/04/2017   Thoracic ascending aortic aneurysm (HCC)    4.5 cm 06/04/19 CT   Ulnar neuropathy 10/05/2018   Volume overload 10/23/2017    Patient Active Problem List   Diagnosis Date Noted   CHF (congestive heart failure) ([place]) 02/17/2021   Cellulitis of arm, left 02/01/2021   Cellulitis of left lower limb 02/01/2021   Cellulitis, unspecified 02/01/2021   Left arm swelling 01/25/2021   Acute on chronic respiratory failure ([place]) 01/13/2021   Acute on chronic respiratory failure with hypoxia ([place]) 01/12/2021   Concussion with no loss of consciousness 01/12/2021   Laceration without foreign  body of left forearm, initial encounter 01/12/2021   Age-related physical debility 09/20/2020   Allergy, unspecified, initial encounter 09/20/2020   Nausea 09/20/2020   Pain, unspecified 09/20/2020   Pruritus, unspecified 09/20/2020   Acute encephalopathy 08/26/2020   Peripheral neuropathy 08/26/2020   Elevated troponin 08/25/2020   Hyperkalemia 08/05/2020   Arthritis    Asthma    Cataract    Cerebral infarction due to thrombosis of cerebral artery (HCC)     History of CVA (cerebrovascular accident)    Chronic kidney disease    Dyspnea    Hyperlipidemia    Hypertension    Kidney failure    Restless leg syndrome    Degenerative lumbar spinal stenosis 40/97/3532   Diastolic dysfunction 99/24/2683   Renovascular hypertension 06/22/2020   Demand ischemia ([place]) 06/18/2020   LVH (left ventricular hypertrophy) due to hypertensive disease, with heart failure ([place]) 06/18/2020   Myofascial pain syndrome 06/12/2020   Encounter for removal of sutures 03/29/2020   History of fusion of cervical spine 03/26/2020   Non-compliance with renal dialysis ([place]) 02/08/2020   Acute pulmonary edema ([place]) 01/23/2020   Cervical disc herniation 01/04/2020   Tachycardia 01/04/2020   SOB (shortness of breath)    Hypoxia 12/30/2019   Chronic low back pain 11/24/2019   Degeneration of lumbar intervertebral disc 11/24/2019   Lumbar radiculopathy 11/24/2019   Left shoulder pain 11/11/2019   Anaphylactic shock, unspecified, sequela 06/10/2019   Rib fractures 06/05/2019   Fall at home, initial encounter 06/04/2019   Right rib fracture 06/04/2019   Lung contusion 06/04/2019   Acute respiratory failure with hypoxia ([place]) 06/04/2019   Anemia in ESRD (end-stage renal disease) ([place]) 06/04/2019   GERD (gastroesophageal reflux disease) 06/04/2019   Chronic diastolic (congestive) heart failure ([place]) 06/04/2019   Fall 06/04/2019   Thoracic ascending aortic aneurysm ([place]) 06/04/2019   Acute exacerbation of congestive heart failure ([place]) 05/16/2019   Carpal tunnel syndrome of right wrist 10/05/2018   Ulnar neuropathy 10/05/2018   Neuritis of right ulnar nerve 09/13/2018   Pain in joint of right elbow 09/13/2018   Chest pain 04/13/2018   Chronic diastolic heart failure ([place]) 04/13/2018   Acute on chronic diastolic CHF (congestive heart failure) ([place]) 04/13/2018   Chest discomfort    Atypical chest pain 02/16/2018   ESRD on dialysis ([place]) 02/16/2018   ESRD (end stage  renal disease) on dialysis ([place]) 02/16/2018   Volume overload 10/23/2017   CAP (community acquired pneumonia) 10/23/2017   Asthma, chronic, unspecified asthma severity, with acute exacerbation 10/23/2017   Respiratory failure, acute ([place]) 10/23/2017   Iron deficiency anemia, unspecified 09/09/2017   Gram-negative sepsis, unspecified ([place]) 09/05/2017   Cough 08/23/2017   Encounter for immunization 07/08/2017   Moderate protein-calorie malnutrition ([place]) 06/06/2017   Coagulation defect, unspecified ([place]) 06/04/2017   Hypokalemia 06/04/2017   Other hyperlipidemia 06/04/2017   Secondary hyperparathyroidism of renal origin ([place]) 06/04/2017   Constipation    Macrocytic anemia 05/24/2017   Uremia 05/24/2017   Hypertensive urgency 05/24/2017   Acute kidney injury superimposed on CKD ([place]) 05/24/2017   CKD (chronic kidney disease), stage V ([place]) 05/24/2017   GI bleeding 05/24/2017   Symptomatic anemia 05/24/2017   Stroke ([place]) 2018   AKI (acute kidney injury) ([place]) 04/10/2015   History of completed stroke    Essential hypertension 04/09/2015    Past Surgical History:  Procedure Laterality Date   A/V FISTULAGRAM N/A 01/01/2021   Procedure: A/V FISTULAGRAM;  Surgeon: Serafina Mitchell, MD;  Location:  [place] INVASIVE CV LAB;  Service: Cardiovascular;  Laterality: N/A;   A/V FISTULAGRAM N/A 01/15/2021   Procedure: A/V [id number];  Surgeon: Serafina Mitchell, MD;  Location: [place] CV LAB;  Service: Cardiovascular;  Laterality: N/A;   ANTERIOR CERVICAL DECOMP/DISCECTOMY FUSION N/A 01/04/2020   Procedure: ANTERIOR CERVICAL DECOMPRESSION/DISCECTOMY FUSION CERVICAL FIVE THROUGH SEVEN;  Surgeon: Melina Schools, MD;  Location: [place];  Service: Orthopedics;  Laterality: N/A;  3 hrs   AV FISTULA PLACEMENT Left 05/28/2017   Procedure: LEFT ARM ARTERIOVENOUS (AV) FISTULA CREATION;  Surgeon: Conrad Starrucca, MD;  Location: [place];  Service: Vascular;  Laterality: Left;   AV FISTULA PLACEMENT Left 02/02/2021    Procedure: Debride left forearm, ligation of left upper arm Aretiovenous fistula;  Surgeon: Elam Dutch, MD;  [facility];  Service: Vascular;  Laterality: Left;   EYE SURGERY Right 06/02/2019   Cataract removed   FRACTURE SURGERY     INSERTION OF DIALYSIS CATHETER Right 02/02/2021   Procedure: INSERTION OF Right Internal jugular TUNNELED  DIALYSIS CATHETER;  Surgeon: Elam Dutch, MD;  Location: [place];  Service: Vascular;  Laterality: Right;   IR DIALY SHUNT INTRO NEEDLE/INTRACATH INITIAL W/IMG LEFT Left 09/12/2020   IR FLUORO GUIDE CV LINE RIGHT  05/25/2017   IR US GUIDE VASC ACCESS LEFT  09/12/2020   IR US GUIDE VASC ACCESS RIGHT  05/25/2017   LIGATION OF ARTERIOVENOUS  FISTULA Left 01/10/2021   Procedure: LIGATION OF LEFT ARM RADIOCEPHALIC FISTULA;  Surgeon: Serafina Mitchell, MD;  Location: [place];  Service: Vascular;  Laterality: Left;   PERIPHERAL VASCULAR BALLOON ANGIOPLASTY Left 01/15/2021   Procedure: PERIPHERAL VASCULAR BALLOON ANGIOPLASTY;  Surgeon: Serafina Mitchell, MD;  Location: [place] CV LAB;  Service: Cardiovascular;  Laterality: Left;  AVF   REVISON OF ARTERIOVENOUS FISTULA Left 07/18/2019   Procedure: REVISION PLICATION OF RADIOCEPHALIC ARTERIOVENOUS FISTULA LEFT ARM;  Surgeon: Angelia Mould, MD;  Location: [facility];  Service: Vascular;  Laterality: Left;   REVISON OF ARTERIOVENOUS FISTULA Left 01/10/2021   Procedure: CONVERSION TO BRACHIOCEPHALIC ARTERIOVENOUS FISTULA;  Surgeon: Serafina Mitchell, MD;  Location: [facility] OR;  Service: Vascular;  Laterality: Left;   RINOPLASTY         Family History  Problem Relation Age of Onset   Cancer Mother     Social History   Tobacco Use   Smoking status: Never   Smokeless tobacco: Never  Vaping Use   Vaping Use: Never used  Substance Use Topics   Alcohol use: Not Currently    Alcohol/week: 0.0 standard drinks    Comment: has a drink once in a while- 12/30/19   Drug use: No    Home Medications Prior to  Admission medications   Medication Sig Start Date End Date Taking? Authorizing Provider  acetaminophen (TYLENOL) 500 MG tablet Take 500 mg by mouth every 6 (six) hours as needed for moderate pain or headache.    [provider]  albuterol (VENTOLIN HFA) 108 (90 Base) MCG/ACT inhaler Inhale 2 puffs into the lungs in the morning and at bedtime.    [provider]  amLODipine (NORVASC) 10 MG tablet Take 10 mg by mouth daily.     [provider]  BREO ELLIPTA 100-25 MCG/INH AEPB Inhale 1 puff into the lungs 2 (two) times daily as needed (sob/wheezing). 06/26/20   [provider]  cholecalciferol (VITAMIN D3) 25 MCG (1000 UNIT) tablet Take 1,000 Units by mouth once a week.  [provider]  DIPHENHYDRAMINE HCL PO Take 25 mg by mouth as needed (allergies). 09/24/20 09/23/21  [provider]  doxercalciferol (HECTOROL) 0.5 MCG capsule Take 0.5 mcg by mouth 3 (three) times a week. 11/26/20 11/25/21  [provider]  furosemide (LASIX) 80 MG tablet Take 80 mg by mouth as directed. Take Daily PRN On Non-Dialysis Days (Sun, Tues, Thurs, Sat)    [provider]  hydrALAZINE (APRESOLINE) 25 MG tablet Take 50 mg by mouth in the morning and at bedtime.    [provider]  metoprolol tartrate (LOPRESSOR) 100 MG tablet Take 0.5 tablets (50 mg total) by mouth 2 (two) times daily. Patient taking differently: Take 100 mg by mouth daily. 01/15/21   Alma Friendly, MD  multivitamin (RENA-VIT) TABS tablet Take 1 tablet by mouth daily. 01/12/18   [provider]  pregabalin (LYRICA) 25 MG capsule Take 25 mg by mouth daily as needed (pain). 11/29/20   [provider]  sevelamer carbonate (RENVELA) 800 MG tablet Take 800-1,600 mg by mouth daily as needed (with snacks or meals). With Snacks & Meals 11/07/19   [provider]  traMADol (ULTRAM) 50 MG tablet Take 1 tablet (50 mg total) by mouth every 6 (six) hours as needed  for moderate pain. 01/25/21   Ulyses Amor, PA-C    Allergies    Patient has no known allergies.  Review of Systems   Review of Systems  Unable to perform ROS: Acuity of condition  Constitutional:  Negative for fever.  HENT:  Negative for drooling.   Eyes:  Negative for redness.  Respiratory:  Positive for shortness of breath. Negative for hemoptysis.   Gastrointestinal:  Negative for vomiting.  Musculoskeletal:  Negative for neck stiffness.  Skin:  Negative for rash.  Neurological:  Negative for facial asymmetry.  Psychiatric/Behavioral:  Negative for agitation.    Physical Exam Updated Vital Signs BP (!) 153/77   Pulse 72   Temp 98.7 F (37.1 C) (Oral)   Resp 15   SpO2 100%   Physical Exam Vitals and nursing note reviewed. Exam conducted with a chaperone present.  Constitutional:      Appearance: He is ill-appearing. He is not diaphoretic.  HENT:     Head: Normocephalic and atraumatic.     Right Ear: Tympanic membrane normal.     Left Ear: Tympanic membrane normal.     Nose: Nose normal.  Eyes:     Conjunctiva/sclera: Conjunctivae normal.     Pupils: Pupils are equal, round, and reactive to light.  Cardiovascular:     Rate and Rhythm: Normal rate and regular rhythm.     Pulses: Normal pulses.     Heart sounds: Normal heart sounds.  Pulmonary:     Breath sounds: Rhonchi and rales present.  Abdominal:     General: Abdomen is flat. Bowel sounds are normal.     Tenderness: There is no abdominal tenderness. There is no guarding.  Musculoskeletal:     Cervical back: Normal range of motion and neck supple. No rigidity.     Right lower leg: No edema.     Left lower leg: No edema.  Skin:    General: Skin is warm and dry.     Capillary Refill: Capillary refill takes less than 2 seconds.     Coloration: Skin is pale.  Neurological:     Mental Status: He is alert.     Deep Tendon Reflexes: Reflexes normal.  Psychiatric:  Mood and Affect: Mood normal.     ED Results / Procedures / Treatments   Labs (all labs ordered are listed, but only abnormal results are displayed) Results for orders placed or performed during the hospital encounter of 02/18/21  Resp Panel by RT-PCR (Flu A&B, Covid) Nasopharyngeal Swab   Specimen: Nasopharyngeal Swab; Nasopharyngeal(NP) swabs in vial transport medium  Result Value Ref Range   SARS Coronavirus 2 by RT PCR NEGATIVE NEGATIVE   Influenza A by PCR NEGATIVE NEGATIVE   Influenza B by PCR NEGATIVE NEGATIVE  CBC with Differential/Platelet  Result Value Ref Range   WBC 5.5 4.0 - 10.5 K/uL   RBC 2.28 (L) 4.22 - 5.81 MIL/uL   Hemoglobin 7.3 (L) 13.0 - 17.0 g/dL   HCT 23.0 (L) 39.0 - 52.0 %   MCV 100.9 (H) 80.0 - 100.0 fL   MCH 32.0 26.0 - 34.0 pg   MCHC 31.7 30.0 - 36.0 g/dL   RDW 14.2 11.5 - 15.5 %   Platelets 149 (L) 150 - 400 K/uL   nRBC 0.0 0.0 - 0.2 %   Neutrophils Relative % 85 %   Neutro Abs 4.7 1.7 - 7.7 K/uL   Lymphocytes Relative 6 %   Lymphs Abs 0.3 (L) 0.7 - 4.0 K/uL   Monocytes Relative 3 %   Monocytes Absolute 0.2 0.1 - 1.0 K/uL   Eosinophils Relative 4 %   Eosinophils Absolute 0.2 0.0 - 0.5 K/uL   Basophils Relative 1 %   Basophils Absolute 0.1 0.0 - 0.1 K/uL   Immature Granulocytes 1 %   Abs Immature Granulocytes 0.03 0.00 - 0.07 K/uL  Comprehensive metabolic panel  Result Value Ref Range   Sodium 138 135 - 145 mmol/L   Potassium 5.7 (H) 3.5 - 5.1 mmol/L   Chloride 98 98 - 111 mmol/L   CO2 23 22 - 32 mmol/L   Glucose, Bld 89 70 - 99 mg/dL   BUN 49 (H) 8 - 23 mg/dL   Creatinine, Ser 7.86 (H) 0.61 - 1.24 mg/dL   Calcium 9.4 8.9 - 10.3 mg/dL   Total Protein 6.4 (L) 6.5 - 8.1 g/dL   Albumin 2.9 (L) 3.5 - 5.0 g/dL   AST 14 (L) 15 - 41 U/L   ALT 9 0 - 44 U/L   Alkaline Phosphatase 84 38 - 126 U/L   Total Bilirubin 1.6 (H) 0.3 - 1.2 mg/dL   GFR, Estimated 7 (L) >60 mL/min   Anion gap 17 (H) 5 - 15  I-stat chem 8, ED (not at [facility] or [facility])  Result Value Ref Range   Sodium 136  135 - 145 mmol/L   Potassium 6.3 (HH) 3.5 - 5.1 mmol/L   Chloride 103 98 - 111 mmol/L   BUN 63 (H) 8 - 23 mg/dL   Creatinine, Ser 8.30 (H) 0.61 - 1.24 mg/dL   Glucose, Bld 84 70 - 99 mg/dL   Calcium, Ion 1.12 (L) 1.15 - 1.40 mmol/L   TCO2 25 22 - 32 mmol/L   Hemoglobin 7.5 (L) 13.0 - 17.0 g/dL   HCT 22.0 (L) 39.0 - 52.0 %   Comment NOTIFIED PHYSICIAN   POC occult blood, ED  Result Value Ref Range   Fecal Occult Bld NEGATIVE NEGATIVE  Troponin I (High Sensitivity)  Result Value Ref Range   Troponin I (High Sensitivity) 47 (H) <18 ng/L   DG Chest Portable 1 View  Result Date: 02/18/2021 CLINICAL DATA:  Respiratory distress. EXAM: PORTABLE CHEST 1 VIEW COMPARISON:  None. FINDINGS: Right chest wall dialysis catheter with tip overlying the expected region of the superior cavoatrial junction. Redemonstration of hilar vasculature prominence. The heart size and mediastinal contours are unchanged. Aortic arch calcification. Similar patchy airspace opacities within the right mid and lower lung zones. No pulmonary edema. No pleural effusion. No pneumothorax. Mach effect along the right heart border. No acute osseous abnormality.  Cervical spine surgical hardware. IMPRESSION: Similar patchy airspace opacities within the right mid and lower lung zones. Finding could represent infection/inflammation. Electronically Signed   By: Iven Finn M.D.   On: 02/18/2021 01:33   DG Chest Portable 1 View  Result Date: 02/17/2021 CLINICAL DATA:  Chest pain. EXAM: PORTABLE CHEST 1 VIEW COMPARISON:  Prior chest radiographs 02/14/2021 and earlier. FINDINGS: Unchanged position of a right-sided dialysis catheter with tip projecting at the level of the superior cavoatrial junction. Unchanged cardiomegaly and central pulmonary vascular congestion. Aortic atherosclerosis. Mild ill-defined opacity within the right lung base, new as compared to the prior examination. No evidence of pleural effusion or pneumothorax. No acute  bony abnormality identified. Partially imaged ACDF hardware. IMPRESSION: Unchanged cardiomegaly and central pulmonary vascular congestion. Mild ill-defined opacity within the right lung base, new as compared to the chest radiograph of 02/14/2021. This may reflect atelectasis. Early pneumonia is difficult to exclude and clinical correlation is recommended. Aortic Atherosclerosis (ICD10-I70.0). Electronically Signed   By: Kellie Simmering DO   On: 02/17/2021 11:09   DG Chest Port 1 View  Result Date: 02/14/2021 CLINICAL DATA:  60 year old male with shortness of breath and chest pain EXAM: PORTABLE CHEST 1 VIEW COMPARISON:  Chest radiograph dated 02/02/2021. FINDINGS: Right-sided dialysis catheter in similar position. There is cardiomegaly with vascular congestion and probable mild edema. No focal consolidation or pneumothorax. Trace left pleural effusion may be present. No acute osseous pathology. Cervical ACDF. IMPRESSION: Cardiomegaly with findings of mild CHF similar to prior radiograph. No focal consolidation. Electronically Signed   By: Anner Crete M.D.   On: 02/14/2021 22:59   DG CHEST PORT 1 VIEW  Result Date: 02/02/2021 CLINICAL DATA:  S/P hemodialysis catheter insertion EXAM: PORTABLE CHEST - 1 VIEW COMPARISON:  01/13/2021 FINDINGS: Tunneled right IJ hemodialysis catheter extends to the cavoatrial junction. No pneumothorax. Mild cardiomegaly stable.  Aortic Atherosclerosis (ICD10-170.0). Hazy predominately perihilar interstitial opacities suggesting mild edema. Patchy somewhat coarse poorly marginated airspace opacities in the right apex and left retrocardiac region. Blunting of the left lateral costophrenic angle suggesting small effusion. Cervical fixation hardware partially visualized. IMPRESSION: 1. Central venous catheter to cavoatrial junction without pneumothorax. Electronically Signed   By: Lucrezia Europe M.D.   On: 02/02/2021 09:43   DG Fluoro Guide CV Line-No Report  Result Date:  02/02/2021 Fluoroscopy was utilized by the requesting physician.  No radiographic interpretation.    DG Chest Portable 1 View  Result Date: 02/18/2021 CLINICAL DATA:  Respiratory distress. EXAM: PORTABLE CHEST 1 VIEW COMPARISON:  None. FINDINGS: Right chest wall dialysis catheter with tip overlying the expected region of the superior cavoatrial junction. Redemonstration of hilar vasculature prominence. The heart size and mediastinal contours are unchanged. Aortic arch calcification. Similar patchy airspace opacities within the right mid and lower lung zones. No pulmonary edema. No pleural effusion. No pneumothorax. Mach effect along the right heart border. No acute osseous abnormality.  Cervical spine surgical hardware. IMPRESSION: Similar patchy airspace opacities within the right mid and lower lung zones. Finding could represent infection/inflammation. Electronically Signed   By: Iven Finn M.D.   On:  02/18/2021 01:33   DG Chest Portable 1 View  Result Date: 02/17/2021 CLINICAL DATA:  Chest pain. EXAM: PORTABLE CHEST 1 VIEW COMPARISON:  Prior chest radiographs 02/14/2021 and earlier. FINDINGS: Unchanged position of a right-sided dialysis catheter with tip projecting at the level of the superior cavoatrial junction. Unchanged cardiomegaly and central pulmonary vascular congestion. Aortic atherosclerosis. Mild ill-defined opacity within the right lung base, new as compared to the prior examination. No evidence of pleural effusion or pneumothorax. No acute bony abnormality identified. Partially imaged ACDF hardware. IMPRESSION: Unchanged cardiomegaly and central pulmonary vascular congestion. Mild ill-defined opacity within the right lung base, new as compared to the chest radiograph of 02/14/2021. This may reflect atelectasis. Early pneumonia is difficult to exclude and clinical correlation is recommended. Aortic Atherosclerosis (ICD10-I70.0). Electronically Signed   By: Kellie Simmering DO   On: 02/17/2021  11:09   DG Chest Port 1 View  Result Date: 02/14/2021 CLINICAL DATA:  60 year old male with shortness of breath and chest pain EXAM: PORTABLE CHEST 1 VIEW COMPARISON:  Chest radiograph dated 02/02/2021. FINDINGS: Right-sided dialysis catheter in similar position. There is cardiomegaly with vascular congestion and probable mild edema. No focal consolidation or pneumothorax. Trace left pleural effusion may be present. No acute osseous pathology. Cervical ACDF. IMPRESSION: Cardiomegaly with findings of mild CHF similar to prior radiograph. No focal consolidation. Electronically Signed   By: Anner Crete M.D.   On: 02/14/2021 22:59   DG CHEST PORT 1 VIEW  Result Date: 02/02/2021 CLINICAL DATA:  S/P hemodialysis catheter insertion EXAM: PORTABLE CHEST - 1 VIEW COMPARISON:  01/13/2021 FINDINGS: Tunneled right IJ hemodialysis catheter extends to the cavoatrial junction. No pneumothorax. Mild cardiomegaly stable.  Aortic Atherosclerosis (ICD10-170.0). Hazy predominately perihilar interstitial opacities suggesting mild edema. Patchy somewhat coarse poorly marginated airspace opacities in the right apex and left retrocardiac region. Blunting of the left lateral costophrenic angle suggesting small effusion. Cervical fixation hardware partially visualized. IMPRESSION: 1. Central venous catheter to cavoatrial junction without pneumothorax. Electronically Signed   By: Lucrezia Europe M.D.   On: 02/02/2021 09:43   DG Fluoro Guide CV Line-No Report  Result Date: 02/02/2021 Fluoroscopy was utilized by the requesting physician.  No radiographic interpretation.    EKG EKG Interpretation  Date/Time:  Monday February 18 2021 01:43:22 EDT Ventricular Rate:  73 PR Interval:  169 QRS Duration: 86 QT Interval:  448 QTC Calculation: 494 R Axis:   55 Text Interpretation: Sinus rhythm LVH with secondary repolarization abnormality Confirmed by Randal Buba, Lesieli Bresee ([id number]) on 02/18/2021 2:06:45 AM  Radiology DG Chest Portable 1  View  Result Date: 02/18/2021 CLINICAL DATA:  Respiratory distress. EXAM: PORTABLE CHEST 1 VIEW COMPARISON:  None. FINDINGS: Right chest wall dialysis catheter with tip overlying the expected region of the superior cavoatrial junction. Redemonstration of hilar vasculature prominence. The heart size and mediastinal contours are unchanged. Aortic arch calcification. Similar patchy airspace opacities within the right mid and lower lung zones. No pulmonary edema. No pleural effusion. No pneumothorax. Mach effect along the right heart border. No acute osseous abnormality.  Cervical spine surgical hardware. IMPRESSION: Similar patchy airspace opacities within the right mid and lower lung zones. Finding could represent infection/inflammation. Electronically Signed   By: Iven Finn M.D.   On: 02/18/2021 01:33   DG Chest Portable 1 View  Result Date: 02/17/2021 CLINICAL DATA:  Chest pain. EXAM: PORTABLE CHEST 1 VIEW COMPARISON:  Prior chest radiographs 02/14/2021 and earlier. FINDINGS: Unchanged position of a right-sided dialysis catheter with tip projecting at the  level of the superior cavoatrial junction. Unchanged cardiomegaly and central pulmonary vascular congestion. Aortic atherosclerosis. Mild ill-defined opacity within the right lung base, new as compared to the prior examination. No evidence of pleural effusion or pneumothorax. No acute bony abnormality identified. Partially imaged ACDF hardware. IMPRESSION: Unchanged cardiomegaly and central pulmonary vascular congestion. Mild ill-defined opacity within the right lung base, new as compared to the chest radiograph of 02/14/2021. This may reflect atelectasis. Early pneumonia is difficult to exclude and clinical correlation is recommended. Aortic Atherosclerosis (ICD10-I70.0). Electronically Signed   By: Kellie Simmering DO   On: 02/17/2021 11:09    Procedures Procedures   Medications Ordered in ED Medications  vancomycin (VANCOREADY) IVPB 1250 mg/250 mL  (has no administration in time range)  ceFEPIme (MAXIPIME) 2 g in sodium chloride 0.9 % 100 mL IVPB (has no administration in time range)  ceFEPIme (MAXIPIME) 1 g in sodium chloride 0.9 % 100 mL IVPB (has no administration in time range)    ED Course  I have reviewed the triage vital signs and the nursing notes.  Pertinent labs & imaging results that were available during my care of the patient were reviewed by me and considered in my medical decision making (see chart for details). O2 saturation decreased and bipap was initiated in the ED   MDM Reviewed: previous chart, nursing note and vitals Reviewed previous: labs, ECG and x-ray Interpretation: labs, ECG and x-ray (normal white count elevated BUN and creatinine and potassium) Total time providing critical care: 30-74 minutes (bipap intiated). This excludes time spent performing separately reportable procedures and services. Consults: admitting MD  CRITICAL CARE Performed by: Nura Cahoon K Nastashia Gallo-Rasch Total critical care time: 60 minutes Critical care time was exclusive of separately billable procedures and treating other patients. Critical care was necessary to treat or prevent imminent or life-threatening deterioration. Critical care was time spent personally by me on the following activities: development of treatment plan with patient and/or surrogate as well as nursing, discussions with consultants, evaluation of patient's response to treatment, examination of patient, obtaining history from patient or surrogate, ordering and performing treatments and interventions, ordering and review of laboratory studies, ordering and review of radiographic studies, pulse oximetry and re-evaluation of patient's condition.  Anik Wesch was evaluated in Emergency Department on 02/18/2021 for the symptoms described in the history of present illness. He was evaluated in the context of the global COVID-19 pandemic, which necessitated consideration that  the patient might be at risk for infection with the SARS-CoV-2 virus that causes COVID-19. Institutional protocols and algorithms that pertain to the evaluation of patients at risk for COVID-19 are in a state of rapid change based on information released by regulatory bodies including the CDC and federal and state organizations. These policies and algorithms were followed during the patient's care in the ED.  Final Clinical Impression(s) / ED Diagnoses Final diagnoses:  HCAP (healthcare-associated pneumonia)  Fall, subsequent encounter  Renal failure, unspecified chronicity  Elevated troponin I level   Admit to medicine for HCAP        Evens Meno, MD 02/18/21 [id number]

## 2021-02-18 NOTE — Discharge Summary (Signed)
He hasPhysician Discharge Summary  Thomas Robinson IRS:[id number] DOB: 08/21/1961 DOA: 02/18/2021  PCP: [name], PA  Admit date: 02/18/2021  Discharge date: 02/18/2021  Admitted From: Home Disposition: Left [place].  Recommendations for Outpatient Follow-up:  Follow up with PCP in 1-2 weeks Please obtain BMP/CBC in one week   Home Health:None Equipment/Devices:None  Discharge Condition: Stable CODE STATUS:Full code Diet recommendation: Heart Healthy   Brief [facility] Course: This 60 years old male with PMH significant for end-stage renal disease on hemodialysis on Monday Wednesday and Fridays through RIJ tunneled hemodialysis catheter, diastolic congestive heart failure, ascending thoracic aortic aneurysm, gastroesophageal reflux disease, restless leg syndrome, anemia and hypertension presents in the ED with complaints of worsening shortness of breath and weakness.  Patient presented in the ED yesterday with similar symptoms,  he was placed on BiPAP temporarily and arrangements were made with nephrology for patient to undergo dialysis in the evening.  Unfortunately after a short course of BiPAP patient felt better,  insisted that he felt back to normal,  he took his BiPAP off and left AGAINST MEDICAL ADVICE.  Patient reports after several hours after arriving home,  he suddenly began having recurrent shortness of breath and readmitted.  Patient was found to be significantly fluid overloaded, nephrology was consulted who has agreed to accept him for hemodialysis.  Patient underwent hemodialysis,  patient felt better and left AGAINST MEDICAL ADVICE.  He was explained in detail about the risk and benefits.   Discharge Diagnoses:  Principal Problem:   Acute respiratory failure with hypoxia (HCC) Active Problems:   Essential hypertension   ESRD on dialysis ([place])   GERD without esophagitis   Acute pulmonary edema (HCC)   Acute on chronic diastolic CHF  (congestive heart failure) (HCC)   Restless leg syndrome   Anemia of chronic disease   Hyperkalemia    Discharge Instructions    Left AGAINST MEDICAL ADVICE   No Known Allergies  Consultations: Nephrology   Procedures/Studies: CT Head Wo Contrast  Result Date: 02/18/2021 CLINICAL DATA:  Fall EXAM: CT HEAD WITHOUT CONTRAST CT CERVICAL SPINE WITHOUT CONTRAST TECHNIQUE: Multidetector CT imaging of the head and cervical spine was performed following the standard protocol without intravenous contrast. Multiplanar CT image reconstructions of the cervical spine were also generated. COMPARISON:  None. FINDINGS: CT HEAD FINDINGS Brain: There is no mass, hemorrhage or extra-axial collection. The size and configuration of the ventricles and extra-axial CSF spaces are normal. The brain parenchyma is normal, without evidence of acute or chronic infarction. Vascular: No abnormal hyperdensity of the major intracranial arteries or dural venous sinuses. No intracranial atherosclerosis. Skull: The visualized skull base, calvarium and extracranial soft tissues are normal. Sinuses/Orbits: No fluid levels or advanced mucosal thickening of the visualized paranasal sinuses. No mastoid or middle ear effusion. The orbits are normal. CT CERVICAL SPINE FINDINGS Alignment: Reversal of lower cervical lordosis. Skull base and vertebrae: C5-7 ACDF Soft tissues and spinal canal: No prevertebral fluid or swelling. No visible canal hematoma. Disc levels: There is moderate spinal canal stenosis at C5-6 and C6-7. Upper chest: No pneumothorax, pulmonary nodule or pleural effusion. Other: Normal visualized paraspinal cervical soft tissues. IMPRESSION: 1. No acute intracranial abnormality. 2. No acute fracture or static subluxation of the cervical spine. 3. C5-7 ACDF with moderate spinal canal stenosis at C5-6 and C6-7. Electronically Signed   By: Ulyses Jarred M.D.   On: 02/18/2021 03:06   CT Cervical Spine Wo Contrast  Result  Date: 02/18/2021 CLINICAL  DATA:  Fall EXAM: CT HEAD WITHOUT CONTRAST CT CERVICAL SPINE WITHOUT CONTRAST TECHNIQUE: Multidetector CT imaging of the head and cervical spine was performed following the standard protocol without intravenous contrast. Multiplanar CT image reconstructions of the cervical spine were also generated. COMPARISON:  None. FINDINGS: CT HEAD FINDINGS Brain: There is no mass, hemorrhage or extra-axial collection. The size and configuration of the ventricles and extra-axial CSF spaces are normal. The brain parenchyma is normal, without evidence of acute or chronic infarction. Vascular: No abnormal hyperdensity of the major intracranial arteries or dural venous sinuses. No intracranial atherosclerosis. Skull: The visualized skull base, calvarium and extracranial soft tissues are normal. Sinuses/Orbits: No fluid levels or advanced mucosal thickening of the visualized paranasal sinuses. No mastoid or middle ear effusion. The orbits are normal. CT CERVICAL SPINE FINDINGS Alignment: Reversal of lower cervical lordosis. Skull base and vertebrae: C5-7 ACDF Soft tissues and spinal canal: No prevertebral fluid or swelling. No visible canal hematoma. Disc levels: There is moderate spinal canal stenosis at C5-6 and C6-7. Upper chest: No pneumothorax, pulmonary nodule or pleural effusion. Other: Normal visualized paraspinal cervical soft tissues. IMPRESSION: 1. No acute intracranial abnormality. 2. No acute fracture or static subluxation of the cervical spine. 3. C5-7 ACDF with moderate spinal canal stenosis at C5-6 and C6-7. Electronically Signed   By: Ulyses Jarred M.D.   On: 02/18/2021 03:06   DG Chest Portable 1 View  Result Date: 02/18/2021 CLINICAL DATA:  Respiratory distress. EXAM: PORTABLE CHEST 1 VIEW COMPARISON:  None. FINDINGS: Right chest wall dialysis catheter with tip overlying the expected region of the superior cavoatrial junction. Redemonstration of hilar vasculature prominence. The heart  size and mediastinal contours are unchanged. Aortic arch calcification. Similar patchy airspace opacities within the right mid and lower lung zones. No pulmonary edema. No pleural effusion. No pneumothorax. Mach effect along the right heart border. No acute osseous abnormality.  Cervical spine surgical hardware. IMPRESSION: Similar patchy airspace opacities within the right mid and lower lung zones. Finding could represent infection/inflammation. Electronically Signed   By: Iven Finn M.D.   On: 02/18/2021 01:33   DG Chest Portable 1 View  Result Date: 02/17/2021 CLINICAL DATA:  Chest pain. EXAM: PORTABLE CHEST 1 VIEW COMPARISON:  Prior chest radiographs 02/14/2021 and earlier. FINDINGS: Unchanged position of a right-sided dialysis catheter with tip projecting at the level of the superior cavoatrial junction. Unchanged cardiomegaly and central pulmonary vascular congestion. Aortic atherosclerosis. Mild ill-defined opacity within the right lung base, new as compared to the prior examination. No evidence of pleural effusion or pneumothorax. No acute bony abnormality identified. Partially imaged ACDF hardware. IMPRESSION: Unchanged cardiomegaly and central pulmonary vascular congestion. Mild ill-defined opacity within the right lung base, new as compared to the chest radiograph of 02/14/2021. This may reflect atelectasis. Early pneumonia is difficult to exclude and clinical correlation is recommended. Aortic Atherosclerosis (ICD10-I70.0). Electronically Signed   By: Kellie Simmering DO   On: 02/17/2021 11:09   DG Chest Port 1 View  Result Date: 02/14/2021 CLINICAL DATA:  60 year old male with shortness of breath and chest pain EXAM: PORTABLE CHEST 1 VIEW COMPARISON:  Chest radiograph dated 02/02/2021. FINDINGS: Right-sided dialysis catheter in similar position. There is cardiomegaly with vascular congestion and probable mild edema. No focal consolidation or pneumothorax. Trace left pleural effusion may be  present. No acute osseous pathology. Cervical ACDF. IMPRESSION: Cardiomegaly with findings of mild CHF similar to prior radiograph. No focal consolidation. Electronically Signed   By: Laren Everts.D.  On: 02/14/2021 22:59   DG CHEST PORT 1 VIEW  Result Date: 02/02/2021 CLINICAL DATA:  S/P hemodialysis catheter insertion EXAM: PORTABLE CHEST - 1 VIEW COMPARISON:  01/13/2021 FINDINGS: Tunneled right IJ hemodialysis catheter extends to the cavoatrial junction. No pneumothorax. Mild cardiomegaly stable.  Aortic Atherosclerosis (ICD10-170.0). Hazy predominately perihilar interstitial opacities suggesting mild edema. Patchy somewhat coarse poorly marginated airspace opacities in the right apex and left retrocardiac region. Blunting of the left lateral costophrenic angle suggesting small effusion. Cervical fixation hardware partially visualized. IMPRESSION: 1. Central venous catheter to cavoatrial junction without pneumothorax. Electronically Signed   By: Lucrezia Europe M.D.   On: 02/02/2021 09:43   DG Fluoro Guide CV Line-No Report  Result Date: 02/02/2021 Fluoroscopy was utilized by the requesting physician.  No radiographic interpretation.      Subjective: Patient was seen and examined in the hemodialysis suite,  he reports feeling much improved.  Discharge Exam: Vitals:   02/18/21 1130 02/18/21 1150  BP: 140/89 137/84  Pulse: (!) 107 (!) 104  Resp:  20  Temp:  98.2 F (36.8 C)  SpO2:  100%   Vitals:   02/18/21 1030 02/18/21 1100 02/18/21 1130 02/18/21 1150  BP: (!) 147/84 137/88 140/89 137/84  Pulse: (!) 111 (!) 108 (!) 107 (!) 104  Resp:    20  Temp:    98.2 F (36.8 C)  TempSrc:    Oral  SpO2:    100%    General: Pt is alert, awake, not in acute distress Cardiovascular: RRR, S1/S2 +, no rubs, no gallops Respiratory: CTA bilaterally, no wheezing, no rhonchi Abdominal: Soft, NT, ND, bowel sounds + Extremities: no edema, no cyanosis    The results of significant diagnostics  from this hospitalization (including imaging, microbiology, ancillary and laboratory) are listed below for reference.     Microbiology: Recent Results (from the past 240 hour(s))  Resp Panel by RT-PCR (Flu A&B, Covid) Nasopharyngeal Swab     Status: None   Collection Time: 02/17/21 10:44 AM   Specimen: Nasopharyngeal Swab; Nasopharyngeal(NP) swabs in vial transport medium  Result Value Ref Range Status   SARS Coronavirus 2 by RT PCR NEGATIVE NEGATIVE Final    Comment: (NOTE) SARS-CoV-2 target nucleic acids are NOT DETECTED.  The SARS-CoV-2 RNA is generally detectable in upper respiratory specimens during the acute phase of infection. The lowest concentration of SARS-CoV-2 viral copies this assay can detect is 138 copies/mL. A negative result does not preclude SARS-Cov-2 infection and should not be used as the sole basis for treatment or other patient management decisions. A negative result may occur with  improper specimen collection/handling, submission of specimen other than nasopharyngeal swab, presence of viral mutation(s) within the areas targeted by this assay, and inadequate number of viral copies(<138 copies/mL). A negative result must be combined with clinical observations, patient history, and epidemiological information. The expected result is Negative.  Fact Sheet for Patients:  EntrepreneurPulse.com.au  Fact Sheet for Healthcare Providers:  IncredibleEmployment.be  This test is no t yet approved or cleared by the [place] FDA and  has been authorized for detection and/or diagnosis of SARS-CoV-2 by FDA under an Emergency Use Authorization (EUA). This EUA will remain  in effect (meaning this test can be used) for the duration of the COVID-19 declaration under Section 564(b)(1) of the Act, 21 U.S.C.section 360bbb-3(b)(1), unless the authorization is terminated  or revoked sooner.       Influenza A by PCR NEGATIVE NEGATIVE  Final   Influenza B by  PCR NEGATIVE NEGATIVE Final    Comment: (NOTE) The Xpert Xpress SARS-CoV-2/FLU/RSV plus assay is intended as an aid in the diagnosis of influenza from Nasopharyngeal swab specimens and should not be used as a sole basis for treatment. Nasal washings and aspirates are unacceptable for Xpert Xpress SARS-CoV-2/FLU/RSV testing.  Fact Sheet for Patients: EntrepreneurPulse.com.au  Fact Sheet for Healthcare Providers: IncredibleEmployment.be  This test is not yet approved or cleared by the [place] FDA and has been authorized for detection and/or diagnosis of SARS-CoV-2 by FDA under an Emergency Use Authorization (EUA). This EUA will remain in effect (meaning this test can be used) for the duration of the COVID-19 declaration under Section 564(b)(1) of the Act, 21 U.S.C. section 360bbb-3(b)(1), unless the authorization is terminated or revoked.  Performed at [facility], [place] [address], [place], [place] [address]   Resp Panel by RT-PCR (Flu A&B, Covid) Nasopharyngeal Swab     Status: None   Collection Time: 02/18/21  1:59 AM   Specimen: Nasopharyngeal Swab; Nasopharyngeal(NP) swabs in vial transport medium  Result Value Ref Range Status   SARS Coronavirus 2 by RT PCR NEGATIVE NEGATIVE Final    Comment: (NOTE) SARS-CoV-2 target nucleic acids are NOT DETECTED.  The SARS-CoV-2 RNA is generally detectable in upper respiratory specimens during the acute phase of infection. The lowest concentration of SARS-CoV-2 viral copies this assay can detect is 138 copies/mL. A negative result does not preclude SARS-Cov-2 infection and should not be used as the sole basis for treatment or other patient management decisions. A negative result may occur with  improper specimen collection/handling, submission of specimen other than nasopharyngeal swab, presence of viral mutation(s) within the areas targeted by this assay, and  inadequate number of viral copies(<138 copies/mL). A negative result must be combined with clinical observations, patient history, and epidemiological information. The expected result is Negative.  Fact Sheet for Patients:  EntrepreneurPulse.com.au  Fact Sheet for Healthcare Providers:  IncredibleEmployment.be  This test is no t yet approved or cleared by the [place] FDA and  has been authorized for detection and/or diagnosis of SARS-CoV-2 by FDA under an Emergency Use Authorization (EUA). This EUA will remain  in effect (meaning this test can be used) for the duration of the COVID-19 declaration under Section 564(b)(1) of the Act, 21 U.S.C.section 360bbb-3(b)(1), unless the authorization is terminated  or revoked sooner.       Influenza A by PCR NEGATIVE NEGATIVE Final   Influenza B by PCR NEGATIVE NEGATIVE Final    Comment: (NOTE) The Xpert Xpress SARS-CoV-2/FLU/RSV plus assay is intended as an aid in the diagnosis of influenza from Nasopharyngeal swab specimens and should not be used as a sole basis for treatment. Nasal washings and aspirates are unacceptable for Xpert Xpress SARS-CoV-2/FLU/RSV testing.  Fact Sheet for Patients: EntrepreneurPulse.com.au  Fact Sheet for Healthcare Providers: IncredibleEmployment.be  This test is not yet approved or cleared by the [place] FDA and has been authorized for detection and/or diagnosis of SARS-CoV-2 by FDA under an Emergency Use Authorization (EUA). This EUA will remain in effect (meaning this test can be used) for the duration of the COVID-19 declaration under Section 564(b)(1) of the Act, 21 U.S.C. section 360bbb-3(b)(1), unless the authorization is terminated or revoked.  Performed at [facility], [place] [address] [place], [place] [address]   Blood culture (routine x 2)     Status: None (Preliminary result)   Collection Time: 02/18/21   2:51 AM   Specimen: BLOOD  Result Value Ref Range Status   Specimen Description BLOOD BLOOD RIGHT ARM  Final   Special Requests   Final    BOTTLES DRAWN AEROBIC AND ANAEROBIC Blood Culture adequate volume Performed at [facility], [address], [place], [place] [address]    Culture PENDING  Incomplete   Report Status PENDING  Incomplete     Labs: BNP (last 3 results) Recent Labs    09/18/20 0635 02/18/21 0107  BNP 4,033.1* 4,782.9*   Basic Metabolic Panel: Recent Labs  Lab 02/14/21 2208 02/14/21 2225 02/17/21 1045 02/18/21 0107 02/18/21 0202 02/18/21 0251 02/18/21 0449  NA 138 137 139 138 136  --  136  K 5.8* 5.8* 5.1 5.7* 6.3* 5.9* 5.9*  CL 99 104 99 98 103  --   --   CO2 24  --  23 23  --   --   --   GLUCOSE 95 90 88 89 84  --   --   BUN 61* 58* 42* 49* 63*  --   --   CREATININE 8.56* 9.20* 6.96* 7.86* 8.30*  --   --   CALCIUM 9.8  --  10.2 9.4  --   --   --    Liver Function Tests: Recent Labs  Lab 02/18/21 0107  AST 14*  ALT 9  ALKPHOS 84  BILITOT 1.6*  PROT 6.4*  ALBUMIN 2.9*   No results for input(s): LIPASE, AMYLASE in the last 168 hours. No results for input(s): AMMONIA in the last 168 hours. CBC: Recent Labs  Lab 02/14/21 2208 02/14/21 2225 02/17/21 1045 02/18/21 0107 02/18/21 0202 02/18/21 0449  WBC 5.8  --  5.8 5.5  --   --   NEUTROABS 4.6  --   --  4.7  --   --   HGB 8.4* 8.2* 8.0* 7.3* 7.5* 7.5*  HCT 26.4* 24.0* 25.2* 23.0* 22.0* 22.0*  MCV 101.1*  --  102.9* 100.9*  --   --   PLT 197  --  166 149*  --   --    Cardiac Enzymes: No results for input(s): CKTOTAL, CKMB, CKMBINDEX, TROPONINI in the last 168 hours. BNP: Invalid input(s): POCBNP CBG: Recent Labs  Lab 02/17/21 1126  GLUCAP 78   D-Dimer No results for input(s): DDIMER in the last 72 hours. Hgb A1c No results for input(s): HGBA1C in the last 72 hours. Lipid Profile No results for input(s): CHOL, HDL, LDLCALC, TRIG, CHOLHDL, LDLDIRECT in the last 72  hours. Thyroid function studies No results for input(s): TSH, T4TOTAL, T3FREE, THYROIDAB in the last 72 hours.  Invalid input(s): FREET3 Anemia work up No results for input(s): VITAMINB12, FOLATE, FERRITIN, TIBC, IRON, RETICCTPCT in the last 72 hours. Urinalysis    Component Value Date/Time   COLORURINE YELLOW 05/24/2017 1729   APPEARANCEUR HAZY (A) 05/24/2017 1729   LABSPEC 1.011 05/24/2017 1729   PHURINE 5.0 05/24/2017 1729   GLUCOSEU 50 (A) 05/24/2017 1729   HGBUR SMALL (A) 05/24/2017 1729   BILIRUBINUR NEGATIVE 05/24/2017 1729   KETONESUR NEGATIVE 05/24/2017 1729   PROTEINUR 100 (A) 05/24/2017 1729   UROBILINOGEN 0.2 04/09/2015 1527   NITRITE NEGATIVE 05/24/2017 1729   LEUKOCYTESUR TRACE (A) 05/24/2017 1729   Sepsis Labs Invalid input(s): PROCALCITONIN,  WBC,  LACTICIDVEN Microbiology Recent Results (from the past 240 hour(s))  Resp Panel by RT-PCR (Flu A&B, Covid) Nasopharyngeal Swab     Status: None   Collection Time: 02/17/21 10:44 AM   Specimen: Nasopharyngeal Swab; Nasopharyngeal(NP) swabs  in vial transport medium  Result Value Ref Range Status   SARS Coronavirus 2 by RT PCR NEGATIVE NEGATIVE Final    Comment: (NOTE) SARS-CoV-2 target nucleic acids are NOT DETECTED.  The SARS-CoV-2 RNA is generally detectable in upper respiratory specimens during the acute phase of infection. The lowest concentration of SARS-CoV-2 viral copies this assay can detect is 138 copies/mL. A negative result does not preclude SARS-Cov-2 infection and should not be used as the sole basis for treatment or other patient management decisions. A negative result may occur with  improper specimen collection/handling, submission of specimen other than nasopharyngeal swab, presence of viral mutation(s) within the areas targeted by this assay, and inadequate number of viral copies(<138 copies/mL). A negative result must be combined with clinical observations, patient history, and  epidemiological information. The expected result is Negative.  Fact Sheet for Patients:  EntrepreneurPulse.com.au  Fact Sheet for Healthcare Providers:  IncredibleEmployment.be  This test is no t yet approved or cleared by the [place] FDA and  has been authorized for detection and/or diagnosis of SARS-CoV-2 by FDA under an Emergency Use Authorization (EUA). This EUA will remain  in effect (meaning this test can be used) for the duration of the COVID-19 declaration under Section 564(b)(1) of the Act, 21 U.S.C.section 360bbb-3(b)(1), unless the authorization is terminated  or revoked sooner.       Influenza A by PCR NEGATIVE NEGATIVE Final   Influenza B by PCR NEGATIVE NEGATIVE Final    Comment: (NOTE) The Xpert Xpress SARS-CoV-2/FLU/RSV plus assay is intended as an aid in the diagnosis of influenza from Nasopharyngeal swab specimens and should not be used as a sole basis for treatment. Nasal washings and aspirates are unacceptable for Xpert Xpress SARS-CoV-2/FLU/RSV testing.  Fact Sheet for Patients: EntrepreneurPulse.com.au  Fact Sheet for Healthcare Providers: IncredibleEmployment.be  This test is not yet approved or cleared by the [place] FDA and has been authorized for detection and/or diagnosis of SARS-CoV-2 by FDA under an Emergency Use Authorization (EUA). This EUA will remain in effect (meaning this test can be used) for the duration of the COVID-19 declaration under Section 564(b)(1) of the Act, 21 U.S.C. section 360bbb-3(b)(1), unless the authorization is terminated or revoked.  Performed at [facility], [place] [address], [place], [place] [address]   Resp Panel by RT-PCR (Flu A&B, Covid) Nasopharyngeal Swab     Status: None   Collection Time: 02/18/21  1:59 AM   Specimen: Nasopharyngeal Swab; Nasopharyngeal(NP) swabs in vial transport medium  Result Value Ref Range Status    SARS Coronavirus 2 by RT PCR NEGATIVE NEGATIVE Final    Comment: (NOTE) SARS-CoV-2 target nucleic acids are NOT DETECTED.  The SARS-CoV-2 RNA is generally detectable in upper respiratory specimens during the acute phase of infection. The lowest concentration of SARS-CoV-2 viral copies this assay can detect is 138 copies/mL. A negative result does not preclude SARS-Cov-2 infection and should not be used as the sole basis for treatment or other patient management decisions. A negative result may occur with  improper specimen collection/handling, submission of specimen other than nasopharyngeal swab, presence of viral mutation(s) within the areas targeted by this assay, and inadequate number of viral copies(<138 copies/mL). A negative result must be combined with clinical observations, patient history, and epidemiological information. The expected result is Negative.  Fact Sheet for Patients:  EntrepreneurPulse.com.au  Fact Sheet for Healthcare Providers:  IncredibleEmployment.be  This test is no t yet approved or cleared by the [place] and  has been authorized for detection and/or diagnosis of SARS-CoV-2 by FDA under an Emergency Use Authorization (EUA). This EUA will remain  in effect (meaning this test can be used) for the duration of the COVID-19 declaration under Section 564(b)(1) of the Act, 21 U.S.C.section 360bbb-3(b)(1), unless the authorization is terminated  or revoked sooner.       Influenza A by PCR NEGATIVE NEGATIVE Final   Influenza B by PCR NEGATIVE NEGATIVE Final    Comment: (NOTE) The Xpert Xpress SARS-CoV-2/FLU/RSV plus assay is intended as an aid in the diagnosis of influenza from Nasopharyngeal swab specimens and should not be used as a sole basis for treatment. Nasal washings and aspirates are unacceptable for Xpert Xpress SARS-CoV-2/FLU/RSV testing.  Fact Sheet for  Patients: EntrepreneurPulse.com.au  Fact Sheet for Healthcare Providers: IncredibleEmployment.be  This test is not yet approved or cleared by the [place] FDA and has been authorized for detection and/or diagnosis of SARS-CoV-2 by FDA under an Emergency Use Authorization (EUA). This EUA will remain in effect (meaning this test can be used) for the duration of the COVID-19 declaration under Section 564(b)(1) of the Act, 21 U.S.C. section 360bbb-3(b)(1), unless the authorization is terminated or revoked.  Performed at [facility], [place] [address], [place], [place] [address]   Blood culture (routine x 2)     Status: None (Preliminary result)   Collection Time: 02/18/21  2:51 AM   Specimen: BLOOD  Result Value Ref Range Status   Specimen Description BLOOD BLOOD RIGHT ARM  Final   Special Requests   Final    BOTTLES DRAWN AEROBIC AND ANAEROBIC Blood Culture adequate volume Performed at [facility], [place] [address], [place], [place] [address]    Culture PENDING  Incomplete   Report Status PENDING  Incomplete     Time coordinating discharge: Left against medical advice. SIGNED:   Shawna Clamp, MD  [facility] 02/18/2021, 4:38 PM Pager   If 7PM-7AM, please contact night-coverage [email or web address]

## 2021-02-18 NOTE — ED Triage Notes (Signed)
Pt was brought in by [facility] from home.  Per ems pt and wife are unable to manage and ems has responded there frequently.  Pt had sob and was generally weak and could not get up. EMS placed him on CPAP. CPAP removed on arrival to ED and laced on 3l [place], spo2 90% on 3L [place].  Pt is due to have HD tomorrow, hypertensive.

## 2021-02-18 NOTE — H&P (Signed)
History and Physical    Thomas Robinson YYT:[id number] DOB: September 07, 1960 DOA: 02/18/2021  PCP: [name], PA  Patient coming from: home via EMS   Chief Complaint:  Chief Complaint  Patient presents with   Respiratory Distress     HPI:    60 year old male with past medical history of end-stage renal disease (MWF with RIJ Tunneled HD cath), diastolic congestive heart failure (Echo 5/15 EF 60-65% with G1DD), ascending thoracic aortic aneurysm, gastroesophageal reflux disease, restless leg syndrome, anemia and hypertension who presents to [facility] emergency department with complaints of shortness of breath and weakness.  Of note, patient presented to the emergency department earlier in the day on 6/19 with similar symptoms and complaints of shortness of breath.  Patient was placed on BiPAP temporarily and arranges were made with nephrology for the patient to undergo dialysis the evening of 6/19.  Unfortunately, after short course of BiPAP patient insisted that he felt back to normal, took his BiPAP mask off and demanded to leave [place].  Patient explains that several hours after arriving home, he suddenly began to develop recurrent shortness of breath.  The shortness with is severe in intensity, worse with minimal exertion and improved with rest.  Shortness of breath continued to worsen over the next several hours.  Patient complains of associated generalized weakness that rapidly progressed as the patient's respiratory symptoms also worsen.  Patient's weakness was so severe that he states he fell on the floor in his apartment, prompting his remain to call EMS.  Upon further questioning patient denies fevers, cough, recent travel sick contacts or confirmed contact with COVID-19 infection.  Patient states that he last underwent a dialysis session on Friday 6/17.  Upon prompt evaluation by EMS, patient was found to be in recurrent respiratory distress and placed on  CPAP therapy.  Patient was then brought into [facility] emergency department for evaluation.  Upon evaluation in the emergency department, patient was initially transitioned off of CPAP therapy nasal cannula but developed rapidly worsening symptoms and was temporarily managed with a nonrebreather mask.  While nonrebreather mask and ABG was performed revealing a pH of 7.44, PCO2 32.9 and PO2 of 208.  Attempts are now being made to transition patient to a Ventimask.    Initial evaluation in the emergency department by the emergency room staff brought about concerns for possible pneumonia and therefore patient was provided with initial dosing of cefepime and vancomycin.  The hospitalist group was then called to assess the patient for admission to the hospital.   Review of Systems:   Review of Systems  Respiratory:  Positive for shortness of breath.   Musculoskeletal:  Positive for falls.  Neurological:  Positive for weakness.  All other systems reviewed and are negative.  Past Medical History:  Diagnosis Date   Anaphylactic shock, unspecified, sequela 06/10/2019   Arthritis    Asthma, chronic, unspecified asthma severity, with acute exacerbation 10/23/2017   CAP (community acquired pneumonia) 10/23/2017   Carpal tunnel syndrome of right wrist 10/05/2018   Cataract    right - removed by surgery   Cerebral infarction due to thrombosis of cerebral artery (HCC)    Cervical disc herniation 01/04/2020   Chronic diastolic heart failure ([place]) 04/13/2018   Chronic low back pain 11/24/2019   Constipation    Cough    chronic cough   Encounter for immunization 07/08/2017   ESRD on hemodialysis ([place]) 02/16/2018   Dialysis  T Thus Sat  -  [facility]    Fall 06/04/2019   GERD (gastroesophageal reflux disease) 06/04/2019   GI bleeding 05/24/2017   Gram-negative sepsis, unspecified ([place]) 09/05/2017   History of fusion of cervical spine 03/26/2020   Hyperlipidemia    Hypertension    Hypokalemia  06/04/2017   Iron deficiency anemia, unspecified 09/09/2017   Left shoulder pain 11/11/2019   Lumbar radiculopathy 11/24/2019   Lung contusion 06/04/2019   LVH (left ventricular hypertrophy) due to hypertensive disease, with heart failure ([place]) 06/18/2020   Macrocytic anemia 05/24/2017   Moderate protein-calorie malnutrition ([place]) 06/06/2017   Myofascial pain syndrome 06/12/2020   Neuritis of right ulnar nerve 09/13/2018   Non-compliance with renal dialysis ([place]) 02/08/2020   Oxygen deficiency 12/30/2019   O2 sats on RA 87% at PAT appt    Pain in joint of right elbow 09/13/2018   Pulmonary hypertension ([place])    Renovascular hypertension 06/22/2020   Restless leg syndrome    Rib fractures 06/2019   Right   S/P cardiac cath 08/27/2020   normal coronary arteries.   Secondary hyperparathyroidism of renal origin ([place]) 06/04/2017   Thoracic ascending aortic aneurysm (HCC)    4.5 cm 06/04/19 CT   Ulnar neuropathy 10/05/2018   Volume overload 10/23/2017    Past Surgical History:  Procedure Laterality Date   A/V FISTULAGRAM N/A 01/01/2021   Procedure: A/V FISTULAGRAM;  Surgeon: Serafina Mitchell, MD;  Location: [place] CV LAB;  Service: Cardiovascular;  Laterality: N/A;   A/V FISTULAGRAM N/A 01/15/2021   Procedure: A/V [id number];  Surgeon: Serafina Mitchell, MD;  Location: [place] CV LAB;  Service: Cardiovascular;  Laterality: N/A;   ANTERIOR CERVICAL DECOMP/DISCECTOMY FUSION N/A 01/04/2020   Procedure: ANTERIOR CERVICAL DECOMPRESSION/DISCECTOMY FUSION CERVICAL FIVE THROUGH SEVEN;  Surgeon: Melina Schools, MD;  Location: [place];  Service: Orthopedics;  Laterality: N/A;  3 hrs   AV FISTULA PLACEMENT Left 05/28/2017   Procedure: LEFT ARM ARTERIOVENOUS (AV) FISTULA CREATION;  Surgeon: Conrad Gurley, MD;  Location: [place];  Service: Vascular;  Laterality: Left;   AV FISTULA PLACEMENT Left 02/02/2021   Procedure: Debride left forearm, ligation of left upper arm Aretiovenous fistula;  Surgeon: Elam Dutch, MD;  Location: [facility] OR;  Service: Vascular;  Laterality: Left;   EYE SURGERY Right 06/02/2019   Cataract removed   FRACTURE SURGERY     INSERTION OF DIALYSIS CATHETER Right 02/02/2021   Procedure: INSERTION OF Right Internal jugular TUNNELED  DIALYSIS CATHETER;  Surgeon: Elam Dutch, MD;  Location: [place];  Service: Vascular;  Laterality: Right;   IR DIALY SHUNT INTRO NEEDLE/INTRACATH INITIAL W/IMG LEFT Left 09/12/2020   IR FLUORO GUIDE CV LINE RIGHT  05/25/2017   IR US GUIDE VASC ACCESS LEFT  09/12/2020   IR US GUIDE VASC ACCESS RIGHT  05/25/2017   LIGATION OF ARTERIOVENOUS  FISTULA Left 01/10/2021   Procedure: LIGATION OF LEFT ARM RADIOCEPHALIC FISTULA;  Surgeon: Serafina Mitchell, MD;  Location: [place];  Service: Vascular;  Laterality: Left;   PERIPHERAL VASCULAR BALLOON ANGIOPLASTY Left 01/15/2021   Procedure: PERIPHERAL VASCULAR BALLOON ANGIOPLASTY;  Surgeon: Serafina Mitchell, MD;  Location: [place] CV LAB;  Service: Cardiovascular;  Laterality: Left;  AVF   REVISON OF ARTERIOVENOUS FISTULA Left 07/18/2019   Procedure: REVISION PLICATION OF RADIOCEPHALIC ARTERIOVENOUS FISTULA LEFT ARM;  Surgeon: Angelia Mould, MD;  Location: [place];  Service: Vascular;  Laterality: Left;   REVISON OF ARTERIOVENOUS FISTULA Left 01/10/2021   Procedure: CONVERSION TO  BRACHIOCEPHALIC ARTERIOVENOUS FISTULA;  Surgeon: Serafina Mitchell, MD;  Location: [facility] OR;  Service: Vascular;  Laterality: Left;   RINOPLASTY       reports that he has never smoked. He has never used smokeless tobacco. He reports previous alcohol use. He reports that he does not use drugs.  No Known Allergies  Family History  Problem Relation Age of Onset   Cancer Mother      Prior to Admission medications   Medication Sig Start Date End Date Taking? Authorizing Provider  acetaminophen (TYLENOL) 500 MG tablet Take 500 mg by mouth every 6 (six) hours as needed for moderate pain or headache.    [provider]  albuterol  (VENTOLIN HFA) 108 (90 Base) MCG/ACT inhaler Inhale 2 puffs into the lungs in the morning and at bedtime.    [provider]  amLODipine (NORVASC) 10 MG tablet Take 10 mg by mouth daily.     [provider]  BREO ELLIPTA 100-25 MCG/INH AEPB Inhale 1 puff into the lungs 2 (two) times daily as needed (sob/wheezing). 06/26/20   [provider]  cholecalciferol (VITAMIN D3) 25 MCG (1000 UNIT) tablet Take 1,000 Units by mouth once a week.    [provider]  DIPHENHYDRAMINE HCL PO Take 25 mg by mouth as needed (allergies). 09/24/20 09/23/21  [provider]  doxercalciferol (HECTOROL) 0.5 MCG capsule Take 0.5 mcg by mouth 3 (three) times a week. 11/26/20 11/25/21  [provider]  furosemide (LASIX) 80 MG tablet Take 80 mg by mouth as directed. Take Daily PRN On Non-Dialysis Days (Sun, Tues, Thurs, Sat)    [provider]  hydrALAZINE (APRESOLINE) 25 MG tablet Take 50 mg by mouth in the morning and at bedtime.    [provider]  metoprolol tartrate (LOPRESSOR) 100 MG tablet Take 0.5 tablets (50 mg total) by mouth 2 (two) times daily. Patient taking differently: Take 100 mg by mouth daily. 01/15/21   Alma Friendly, MD  multivitamin (RENA-VIT) TABS tablet Take 1 tablet by mouth daily. 01/12/18   [provider]  pregabalin (LYRICA) 25 MG capsule Take 25 mg by mouth daily as needed (pain). 11/29/20   [provider]  sevelamer carbonate (RENVELA) 800 MG tablet Take 800-1,600 mg by mouth daily as needed (with snacks or meals). With Snacks & Meals 11/07/19   [provider]  traMADol (ULTRAM) 50 MG tablet Take 1 tablet (50 mg total) by mouth every 6 (six) hours as needed for moderate pain. 01/25/21   Ulyses Amor, PA-C    Physical Exam: Vitals:   02/18/21 0330 02/18/21 0400 02/18/21 0430 02/18/21 0455  BP:  (!) 162/78 (!) 154/89   Pulse: 93 (!) 104 94   Resp: (!) 26 (!) 30 (!) 23   Temp:      TempSrc:       SpO2: (!) 88% 98% 100% 97%    Constitutional: Lethargic but awake alert and oriented x3, no associated distress.  Patient is cachectic. Skin: no rashes, no lesions, poor skin turgor noted. Eyes: Pupils are equally reactive to light.  No evidence of scleral icterus or conjunctival pallor.  ENMT: Moist mucous membranes noted.  Posterior pharynx clear of any exudate or lesions.   Neck: Right IJ tunneled hemodialysis catheter noted.  Normal, supple, no masses, no thyromegaly.  No evidence of jugular venous distension.   Respiratory: Coarse breath sounds bilaterally with intermittent expiratory wheezing noted.  No accessory muscle use.  Cardiovascular: Regular rate and  rhythm, no murmurs / rubs / gallops. No extremity edema. 2+ pedal pulses. No carotid bruits.  Chest:   Nontender without crepitus or deformity.   Back:   Nontender without crepitus or deformity. Abdomen: Abdomen is soft and nontender.  No evidence of intra-abdominal masses.  Positive bowel sounds noted in all quadrants.   Musculoskeletal: Poor muscle tone noted.  No significant tenderness or deformity noted otherwise.  Good ROM, no contractures. Normal muscle tone.  Neurologic: CN 2-12 grossly intact. Sensation intact.  Patient moving all 4 extremities spontaneously.  Patient is following all commands.  Patient is responsive to verbal stimuli.   Psychiatric: Patient exhibits normal mood with appropriate affect.  Patient seems to possess insight as to their current situation.     Labs on Admission: I have personally reviewed following labs and imaging studies -   CBC: Recent Labs  Lab 02/14/21 2208 02/14/21 2225 02/17/21 1045 02/18/21 0107 02/18/21 0202 02/18/21 0449  WBC 5.8  --  5.8 5.5  --   --   NEUTROABS 4.6  --   --  4.7  --   --   HGB 8.4* 8.2* 8.0* 7.3* 7.5* 7.5*  HCT 26.4* 24.0* 25.2* 23.0* 22.0* 22.0*  MCV 101.1*  --  102.9* 100.9*  --   --   PLT 197  --  166 149*  --   --    Basic Metabolic Panel: Recent  Labs  Lab 02/14/21 2208 02/14/21 2225 02/17/21 1045 02/18/21 0107 02/18/21 0202 02/18/21 0251 02/18/21 0449  NA 138 137 139 138 136  --  136  K 5.8* 5.8* 5.1 5.7* 6.3* 5.9* 5.9*  CL 99 104 99 98 103  --   --   CO2 24  --  23 23  --   --   --   GLUCOSE 95 90 88 89 84  --   --   BUN 61* 58* 42* 49* 63*  --   --   CREATININE 8.56* 9.20* 6.96* 7.86* 8.30*  --   --   CALCIUM 9.8  --  10.2 9.4  --   --   --    GFR: Estimated Creatinine Clearance: 7.4 mL/min (A) (by C-G formula based on SCr of 8.3 mg/dL (H)). Liver Function Tests: Recent Labs  Lab 02/18/21 0107  AST 14*  ALT 9  ALKPHOS 84  BILITOT 1.6*  PROT 6.4*  ALBUMIN 2.9*   No results for input(s): LIPASE, AMYLASE in the last 168 hours. No results for input(s): AMMONIA in the last 168 hours. Coagulation Profile: No results for input(s): INR, PROTIME in the last 168 hours. Cardiac Enzymes: No results for input(s): CKTOTAL, CKMB, CKMBINDEX, TROPONINI in the last 168 hours. BNP (last 3 results) No results for input(s): PROBNP in the last 8760 hours. HbA1C: No results for input(s): HGBA1C in the last 72 hours. CBG: Recent Labs  Lab 02/17/21 1126  GLUCAP 78   Lipid Profile: No results for input(s): CHOL, HDL, LDLCALC, TRIG, CHOLHDL, LDLDIRECT in the last 72 hours. Thyroid Function Tests: No results for input(s): TSH, T4TOTAL, FREET4, T3FREE, THYROIDAB in the last 72 hours. Anemia Panel: No results for input(s): VITAMINB12, FOLATE, FERRITIN, TIBC, IRON, RETICCTPCT in the last 72 hours. Urine analysis:    Component Value Date/Time   COLORURINE YELLOW 05/24/2017 1729   APPEARANCEUR HAZY (A) 05/24/2017 1729   LABSPEC 1.011 05/24/2017 1729   PHURINE 5.0 05/24/2017 1729   GLUCOSEU 50 (A) 05/24/2017 1729   HGBUR SMALL (A) 05/24/2017 1729  BILIRUBINUR NEGATIVE 05/24/2017 1729   KETONESUR NEGATIVE 05/24/2017 1729   PROTEINUR 100 (A) 05/24/2017 1729   UROBILINOGEN 0.2 04/09/2015 1527   NITRITE NEGATIVE 05/24/2017  1729   LEUKOCYTESUR TRACE (A) 05/24/2017 1729    Radiological Exams on Admission - Personally Reviewed: CT Head Wo Contrast  Result Date: 02/18/2021 CLINICAL DATA:  Fall EXAM: CT HEAD WITHOUT CONTRAST CT CERVICAL SPINE WITHOUT CONTRAST TECHNIQUE: Multidetector CT imaging of the head and cervical spine was performed following the standard protocol without intravenous contrast. Multiplanar CT image reconstructions of the cervical spine were also generated. COMPARISON:  None. FINDINGS: CT HEAD FINDINGS Brain: There is no mass, hemorrhage or extra-axial collection. The size and configuration of the ventricles and extra-axial CSF spaces are normal. The brain parenchyma is normal, without evidence of acute or chronic infarction. Vascular: No abnormal hyperdensity of the major intracranial arteries or dural venous sinuses. No intracranial atherosclerosis. Skull: The visualized skull base, calvarium and extracranial soft tissues are normal. Sinuses/Orbits: No fluid levels or advanced mucosal thickening of the visualized paranasal sinuses. No mastoid or middle ear effusion. The orbits are normal. CT CERVICAL SPINE FINDINGS Alignment: Reversal of lower cervical lordosis. Skull base and vertebrae: C5-7 ACDF Soft tissues and spinal canal: No prevertebral fluid or swelling. No visible canal hematoma. Disc levels: There is moderate spinal canal stenosis at C5-6 and C6-7. Upper chest: No pneumothorax, pulmonary nodule or pleural effusion. Other: Normal visualized paraspinal cervical soft tissues. IMPRESSION: 1. No acute intracranial abnormality. 2. No acute fracture or static subluxation of the cervical spine. 3. C5-7 ACDF with moderate spinal canal stenosis at C5-6 and C6-7. Electronically Signed   By: Ulyses Jarred M.D.   On: 02/18/2021 03:06   CT Cervical Spine Wo Contrast  Result Date: 02/18/2021 CLINICAL DATA:  Fall EXAM: CT HEAD WITHOUT CONTRAST CT CERVICAL SPINE WITHOUT CONTRAST TECHNIQUE: Multidetector CT  imaging of the head and cervical spine was performed following the standard protocol without intravenous contrast. Multiplanar CT image reconstructions of the cervical spine were also generated. COMPARISON:  None. FINDINGS: CT HEAD FINDINGS Brain: There is no mass, hemorrhage or extra-axial collection. The size and configuration of the ventricles and extra-axial CSF spaces are normal. The brain parenchyma is normal, without evidence of acute or chronic infarction. Vascular: No abnormal hyperdensity of the major intracranial arteries or dural venous sinuses. No intracranial atherosclerosis. Skull: The visualized skull base, calvarium and extracranial soft tissues are normal. Sinuses/Orbits: No fluid levels or advanced mucosal thickening of the visualized paranasal sinuses. No mastoid or middle ear effusion. The orbits are normal. CT CERVICAL SPINE FINDINGS Alignment: Reversal of lower cervical lordosis. Skull base and vertebrae: C5-7 ACDF Soft tissues and spinal canal: No prevertebral fluid or swelling. No visible canal hematoma. Disc levels: There is moderate spinal canal stenosis at C5-6 and C6-7. Upper chest: No pneumothorax, pulmonary nodule or pleural effusion. Other: Normal visualized paraspinal cervical soft tissues. IMPRESSION: 1. No acute intracranial abnormality. 2. No acute fracture or static subluxation of the cervical spine. 3. C5-7 ACDF with moderate spinal canal stenosis at C5-6 and C6-7. Electronically Signed   By: Ulyses Jarred M.D.   On: 02/18/2021 03:06   DG Chest Portable 1 View  Result Date: 02/18/2021 CLINICAL DATA:  Respiratory distress. EXAM: PORTABLE CHEST 1 VIEW COMPARISON:  None. FINDINGS: Right chest wall dialysis catheter with tip overlying the expected region of the superior cavoatrial junction. Redemonstration of hilar vasculature prominence. The heart size and mediastinal contours are unchanged. Aortic arch calcification. Similar patchy  airspace opacities within the right mid and  lower lung zones. No pulmonary edema. No pleural effusion. No pneumothorax. Mach effect along the right heart border. No acute osseous abnormality.  Cervical spine surgical hardware. IMPRESSION: Similar patchy airspace opacities within the right mid and lower lung zones. Finding could represent infection/inflammation. Electronically Signed   By: Iven Finn M.D.   On: 02/18/2021 01:33   DG Chest Portable 1 View  Result Date: 02/17/2021 CLINICAL DATA:  Chest pain. EXAM: PORTABLE CHEST 1 VIEW COMPARISON:  Prior chest radiographs 02/14/2021 and earlier. FINDINGS: Unchanged position of a right-sided dialysis catheter with tip projecting at the level of the superior cavoatrial junction. Unchanged cardiomegaly and central pulmonary vascular congestion. Aortic atherosclerosis. Mild ill-defined opacity within the right lung base, new as compared to the prior examination. No evidence of pleural effusion or pneumothorax. No acute bony abnormality identified. Partially imaged ACDF hardware. IMPRESSION: Unchanged cardiomegaly and central pulmonary vascular congestion. Mild ill-defined opacity within the right lung base, new as compared to the chest radiograph of 02/14/2021. This may reflect atelectasis. Early pneumonia is difficult to exclude and clinical correlation is recommended. Aortic Atherosclerosis (ICD10-I70.0). Electronically Signed   By: Kellie Simmering DO   On: 02/17/2021 11:09    EKG: Personally reviewed.  Rhythm is normal sinus rhythm with heart rate of 73 bpm.  No dynamic ST segment changes appreciated.  Assessment/Plan Principal Problem:   Acute respiratory failure with hypoxia (HCC) complicated by acute pulmonary edema with concurrent acute on chronic diastolic congestive heart failure.  Patient exhibiting recurrent acute hypoxic respiratory failure with increasing oxygen requirements and intermittent need for noninvasive positive pressure ventilation While Emergency Department staff was initially  concerned for a concurrent pneumonia, in this patient without fever or leukocytosis I find this unlikely. Provide patient with supplemental oxygen via nasal cannula which will be escalated to noninvasive positive pressure ventilation to assist with work of breathing if patient continues to worsen Patient additionally will be provided with as needed bronchodilator therapy Case discussed with Dr. Jonnie Finner with nephrology who graciously has agreed to take patient for hemodialysis later this morning. Obtaining CRP and procalcitonin and will we consider initiation of antibiotics if these are markedly elevated.  Active Problems:    Hyperkalemia  Lokelma and bicarbonate administered EKG reveals no significant EKG change Monitoring patient on telemetry Serial chemistries to monitor potassium levels Ultimately, patient is to undergo hemodialysis on 6/20.    Essential hypertension  Continuing home regimen of amlodipine, hydralazine and metoprolol.    ESRD on dialysis [facility])   As mentioned above, we have coordinated with Dr. Suzanne Boron proceed with hemodialysis later this morning.    Anemia of chronic disease  No clinical evidence of bleeding Monitoring hemoglobin and hematocrit with serial CBCs.    Code Status:  Full code Family Communication: deferred   Status is: Inpatient  Remains inpatient appropriate because:Ongoing diagnostic testing needed not appropriate for outpatient work up, IV treatments appropriate due to intensity of illness or inability to take PO, and Inpatient level of care appropriate due to severity of illness  Dispo: The patient is from: Group home              Anticipated d/c is to: Group home              Patient currently is not medically stable to d/c.   Difficult to place patient No        Vernelle Emerald MD [facility] Pager [phone]  If 7PM-7AM,  please contact night-coverage [email or web address] Use universal [place] password for that web  site. If you do not have the password, please call the hospital operator.  02/18/2021, 5:38 AM

## 2021-02-23 LAB — CULTURE, BLOOD (ROUTINE X 2)
Culture: NO GROWTH
Culture: NO GROWTH
Special Requests: ADEQUATE

## 2021-02-25 ENCOUNTER — Encounter (HOSPITAL_BASED_OUTPATIENT_CLINIC_OR_DEPARTMENT_OTHER): Payer: Medicare Other | Attending: Internal Medicine | Admitting: Internal Medicine

## 2021-03-06 ENCOUNTER — Ambulatory Visit (INDEPENDENT_AMBULATORY_CARE_PROVIDER_SITE_OTHER): Payer: Medicare Other | Admitting: Cardiology

## 2021-03-06 ENCOUNTER — Encounter: Payer: Self-pay | Admitting: Cardiology

## 2021-03-06 ENCOUNTER — Other Ambulatory Visit: Payer: Self-pay

## 2021-03-06 VITALS — BP 128/60 | HR 70 | Ht 68.0 in | Wt 135.0 lb

## 2021-03-06 DIAGNOSIS — I7121 Aneurysm of the ascending aorta, without rupture: Secondary | ICD-10-CM

## 2021-03-06 DIAGNOSIS — Z01810 Encounter for preprocedural cardiovascular examination: Secondary | ICD-10-CM | POA: Insufficient documentation

## 2021-03-06 DIAGNOSIS — I10 Essential (primary) hypertension: Secondary | ICD-10-CM | POA: Diagnosis not present

## 2021-03-06 DIAGNOSIS — I5032 Chronic diastolic (congestive) heart failure: Secondary | ICD-10-CM | POA: Diagnosis not present

## 2021-03-06 DIAGNOSIS — I712 Thoracic aortic aneurysm, without rupture: Secondary | ICD-10-CM

## 2021-03-06 DIAGNOSIS — Z992 Dependence on renal dialysis: Secondary | ICD-10-CM

## 2021-03-06 DIAGNOSIS — N186 End stage renal disease: Secondary | ICD-10-CM

## 2021-03-06 NOTE — Patient Instructions (Signed)
Medication Instructions:  Your physician recommends that you continue on your current medications as directed. Please refer to the Current Medication list given to you today.  *If you need a refill on your cardiac medications before your next appointment, please call your pharmacy*   Lab Work: None If you have labs (blood work) drawn today and your tests are completely normal, you will receive your results only by: [place] (if you have MyChart) OR A paper copy in the mail If you have any lab test that is abnormal or we need to change your treatment, we will call you to review the results.   Testing/Procedures: None   Follow-Up: At [facility], you and your health needs are our priority.  As part of our continuing mission to provide you with exceptional heart care, we have created designated Provider Care Teams.  These Care Teams include your primary Cardiologist (physician) and Advanced Practice Providers (APPs -  Physician Assistants and Nurse Practitioners) who all work together to provide you with the care you need, when you need it.  We recommend signing up for the patient portal called "MyChart".  Sign up information is provided on this After Visit Summary.  MyChart is used to connect with patients for Virtual Visits (Telemedicine).  Patients are able to view lab/test results, encounter notes, upcoming appointments, etc.  Non-urgent messages can be sent to your provider as well.   To learn more about what you can do with MyChart, go to [email or web address].    Your next appointment:   6 month(s)  The format for your next appointment:   In Person  Provider:   [address] - [name], DO    Other Instructions

## 2021-03-06 NOTE — Progress Notes (Signed)
Cardiology Office Note:    Date:  03/06/2021   ID:  Thomas Robinson, DOB 03/04/1961, MRN [id number]  PCP:  Thomas Niece, PA  Cardiologist:  Thomas Salines, DO  Electrophysiologist:  None   Referring MD: Thomas Niece, PA   " I am planning surgery some  History of Present Illness:    Thomas Robinson is a 60 y.o. male with a hx of ESRD on hemodialysis(Tuesdays, Thursday, Saturday) hypertension, asthma, CVA, coronary artery disease, diastolic heart failure with recent ejection fraction reported as greater than 65%, anemia chronic disease, proximal ascending aorta dilatation measuring 4.5 cm in October 2020, recent CTA show that his aneurysm is still 4.5 cm.   I last saw the patient in July 2021 during that time because he makes urine I put the patient on Lasix on nondialysis day.  I emphasized with the patient again the significance of setting up an appointment for nephrology as I had referred him given his kidney disease.  He was not compliant with his hemodialysis I advised the patient that this could be detrimental.   I saw the patient on June 27, 2020 at that time he was volume overloaded as he had missed dialysis and asked him that he needed to go to the emergency department to be evaluated and needed hemodialysis.  Last saw the patient in December 2021 at that time he presented as he had not been in dialysis and he had recurrent shortness of breath post ED visit he was asked to see me.  Since his visit he was [facility] where he NSTEMI underwent a left heart catheterization which did not show any evidence of coronary artery disease.  He also has had multiple hospitalizations treated for cellulitis as well as pneumonia.  His most recent hospitalization was in June where he had healthcare related pneumonia.  Today he tells me that he is doing well.  He is looking forward to having back surgery.  No complaints from a cardiovascular standpoint.  He denies any chest pain,  any shortness of breath.  He tells me he is very limited by his back pain.   Past Medical History:  Diagnosis Date   Anaphylactic shock, unspecified, sequela 06/10/2019   Arthritis    Asthma, chronic, unspecified asthma severity, with acute exacerbation 10/23/2017   CAP (community acquired pneumonia) 10/23/2017   Carpal tunnel syndrome of right wrist 10/05/2018   Cataract    right - removed by surgery   Cerebral infarction due to thrombosis of cerebral artery (HCC)    Cervical disc herniation 01/04/2020   Chronic diastolic heart failure ([place]) 04/13/2018   Chronic low back pain 11/24/2019   Constipation    Cough    chronic cough   Encounter for immunization 07/08/2017   ESRD on hemodialysis ([place]) 02/16/2018   Dialysis  T Thus Sat  - [facility]    Fall 06/04/2019   GERD (gastroesophageal reflux disease) 06/04/2019   GI bleeding 05/24/2017   Gram-negative sepsis, unspecified ([place]) 09/05/2017   History of fusion of cervical spine 03/26/2020   Hyperlipidemia    Hypertension    Hypokalemia 06/04/2017   Iron deficiency anemia, unspecified 09/09/2017   Left shoulder pain 11/11/2019   Lumbar radiculopathy 11/24/2019   Lung contusion 06/04/2019   LVH (left ventricular hypertrophy) due to hypertensive disease, with heart failure ([place]) 06/18/2020   Macrocytic anemia 05/24/2017   Moderate protein-calorie malnutrition ([place]) 06/06/2017   Myofascial pain syndrome 06/12/2020   Neuritis of right ulnar  nerve 09/13/2018   Non-compliance with renal dialysis ([place]) 02/08/2020   Oxygen deficiency 12/30/2019   O2 sats on RA 87% at PAT appt    Pain in joint of right elbow 09/13/2018   Pulmonary hypertension ([place])    Renovascular hypertension 06/22/2020   Restless leg syndrome    Rib fractures 06/2019   Right   S/P cardiac cath 08/27/2020   normal coronary arteries.   Secondary hyperparathyroidism of renal origin ([place]) 06/04/2017   Thoracic ascending aortic aneurysm (HCC)    4.5 cm 06/04/19 CT   Ulnar  neuropathy 10/05/2018   Volume overload 10/23/2017    Past Surgical History:  Procedure Laterality Date   A/V FISTULAGRAM N/A 01/01/2021   Procedure: A/V FISTULAGRAM;  Surgeon: Serafina Mitchell, MD;  Location: [place] [facility];  Service: Cardiovascular;  Laterality: N/A;   A/V FISTULAGRAM N/A 01/15/2021   Procedure: A/V DXAJOINOMVE;  Surgeon: Serafina Mitchell, MD;  Location: [place] [facility];  Service: Cardiovascular;  Laterality: N/A;   ANTERIOR CERVICAL DECOMP/DISCECTOMY FUSION N/A 01/04/2020   Procedure: ANTERIOR CERVICAL DECOMPRESSION/DISCECTOMY FUSION CERVICAL FIVE THROUGH SEVEN;  Surgeon: Melina Schools, MD;  Location: [place];  Service: Orthopedics;  Laterality: N/A;  3 hrs   AV FISTULA PLACEMENT Left 05/28/2017   Procedure: LEFT ARM ARTERIOVENOUS (AV) FISTULA CREATION;  Surgeon: Conrad Mascotte, MD;  Location: [place];  Service: Vascular;  Laterality: Left;   AV FISTULA PLACEMENT Left 02/02/2021   Procedure: Debride left forearm, ligation of left upper arm Aretiovenous fistula;  Surgeon: Elam Dutch, MD;  Location: [facility];  Service: Vascular;  Laterality: Left;   EYE SURGERY Right 06/02/2019   Cataract removed   FRACTURE SURGERY     INSERTION OF DIALYSIS CATHETER Right 02/02/2021   Procedure: INSERTION OF Right Internal jugular TUNNELED  DIALYSIS CATHETER;  Surgeon: Elam Dutch, MD;  Location: [place];  Service: Vascular;  Laterality: Right;   IR DIALY SHUNT INTRO NEEDLE/INTRACATH INITIAL W/IMG LEFT Left 09/12/2020   IR FLUORO GUIDE CV LINE RIGHT  05/25/2017   IR US GUIDE VASC ACCESS LEFT  09/12/2020   IR US GUIDE VASC ACCESS RIGHT  05/25/2017   LIGATION OF ARTERIOVENOUS  FISTULA Left 01/10/2021   Procedure: LIGATION OF LEFT ARM RADIOCEPHALIC FISTULA;  Surgeon: Serafina Mitchell, MD;  Location: [place];  Service: Vascular;  Laterality: Left;   PERIPHERAL VASCULAR BALLOON ANGIOPLASTY Left 01/15/2021   Procedure: PERIPHERAL VASCULAR BALLOON ANGIOPLASTY;  Surgeon: Serafina Mitchell, MD;  Location:  [place] [facility];  Service: Cardiovascular;  Laterality: Left;  AVF   REVISON OF ARTERIOVENOUS FISTULA Left 07/18/2019   Procedure: REVISION PLICATION OF RADIOCEPHALIC ARTERIOVENOUS FISTULA LEFT ARM;  Surgeon: Angelia Mould, MD;  Location: [place];  Service: Vascular;  Laterality: Left;   REVISON OF ARTERIOVENOUS FISTULA Left 01/10/2021   Procedure: CONVERSION TO BRACHIOCEPHALIC ARTERIOVENOUS FISTULA;  Surgeon: Serafina Mitchell, MD;  Location: [facility];  Service: Vascular;  Laterality: Left;   RINOPLASTY      Current Medications: Current Meds  Medication Sig   acetaminophen (TYLENOL) 500 MG tablet Take 500 mg by mouth every 6 (six) hours as needed for moderate pain or headache.   albuterol (VENTOLIN HFA) 108 (90 Base) MCG/ACT inhaler Inhale 2 puffs into the lungs in the morning and at bedtime.   amLODipine (NORVASC) 10 MG tablet Take 10 mg by mouth daily.    BREO ELLIPTA 100-25 MCG/INH AEPB Inhale 1 puff into the lungs 2 (two) times daily  as needed (sob/wheezing).   cholecalciferol (VITAMIN D3) 25 MCG (1000 UNIT) tablet Take 1,000 Units by mouth once a week.   DIPHENHYDRAMINE HCL PO Take 25 mg by mouth as needed (allergies).   doxercalciferol (HECTOROL) 0.5 MCG capsule Take 0.5 mcg by mouth 3 (three) times a week.   furosemide (LASIX) 80 MG tablet Take 80 mg by mouth as directed. Take Daily PRN On Non-Dialysis Days (Sun, Tues, Thurs, Sat)   hydrALAZINE (APRESOLINE) 25 MG tablet Take 50 mg by mouth in the morning and at bedtime.   metoprolol tartrate (LOPRESSOR) 100 MG tablet Take 0.5 tablets (50 mg total) by mouth 2 (two) times daily. (Patient taking differently: Take 100 mg by mouth daily.)   multivitamin (RENA-VIT) TABS tablet Take 1 tablet by mouth daily.   pregabalin (LYRICA) 25 MG capsule Take 25 mg by mouth daily as needed (pain).   sevelamer carbonate (RENVELA) 800 MG tablet Take 800-1,600 mg by mouth daily as needed (with snacks or meals). With Snacks & Meals   traMADol (ULTRAM)  50 MG tablet Take 1 tablet (50 mg total) by mouth every 6 (six) hours as needed for moderate pain.     Allergies:   Patient has no known allergies.   Social History   Socioeconomic History   Marital status: Single    Spouse name: Not on file   Number of children: Not on file   Years of education: Not on file   Highest education level: Not on file  Occupational History   Not on file  Tobacco Use   Smoking status: Never   Smokeless tobacco: Never  Vaping Use   Vaping Use: Never used  Substance and Sexual Activity   Alcohol use: Not Currently    Alcohol/week: 0.0 standard drinks    Comment: has a drink once in a while- 12/30/19   Drug use: No   Sexual activity: Not Currently  Other Topics Concern   Not on file  Social History Narrative   Lives with a roommate in a one story home.  Has 1 child.  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  Education: high school.   Social Determinants of Health   Financial Resource Strain: Not on file  Food Insecurity: Not on file  Transportation Needs: Not on file  Physical Activity: Not on file  Stress: Not on file  Social Connections: Not on file     Family History: The patient's family history includes Cancer in his mother.  ROS:   Review of Systems  Constitution: Negative for decreased appetite, fever and weight gain.  HENT: Negative for congestion, ear discharge, hoarse voice and sore throat.   Eyes: Negative for discharge, redness, vision loss in right eye and visual halos.  Cardiovascular: Negative for chest pain, dyspnea on exertion, leg swelling, orthopnea and palpitations.  Respiratory: Negative for cough, hemoptysis, shortness of breath and snoring.   Endocrine: Negative for heat intolerance and polyphagia.  Hematologic/Lymphatic: Negative for bleeding problem. Does not bruise/bleed easily.  Skin: Negative for flushing, nail changes, rash and suspicious lesions.  Musculoskeletal: Negative for arthritis, joint pain, muscle cramps,  myalgias, neck pain and stiffness.  Gastrointestinal: Negative for abdominal pain, bowel incontinence, diarrhea and excessive appetite.  Genitourinary: Negative for decreased libido, genital sores and incomplete emptying.  Neurological: Negative for brief paralysis, focal weakness, headaches and loss of balance.  Psychiatric/Behavioral: Negative for altered mental status, depression and suicidal ideas.  Allergic/Immunologic: Negative for HIV exposure and persistent infections.    EKGs/Labs/Other  Studies Reviewed:    The following studies were reviewed today:   EKG:  None today  Left heart catheterization at [facility] which was done on August 27, 2020 Left heart catheterization   Indication:  NSTEMI   Arterial Access:  6 Fr RRA   Diagnostic Catheters:  5 Fr JL 3.5, 6 Fr Tiger, 5 Fr JR 4 and 5 Fr Pigtail   Others:  None   Procedure in detail:  After appropriate written informed consent, the patient was prepped and  draped in the standard sterile fashion.  A procedural timeout was  performed to confirm the right side, right patient and right procedure.    All Cath Lab staff were briefed and all questions were answered.   The right radial artery was accessed using a 6 Fr sheath using standard  Seldinger technique. A selective coronary angiogram was performed using  the above-mentioned diagnostic catheters in multiple standard views and  they were interpreted in the procedure.  An LVEDP was measured at 12 mmHg.   This demonstrated the following findings:  LM: Normal  LAD: Normal  Lcx: Normal  RCA: Normal   At this time, all equipment was removed without complications and a  transradial band was used to obtain hemostasis.  No immediate  complications were noted.  The patient was safely transferred to the  hospital/ICU bed. Total contrast used was 54 mL.   The Cath Lab staff, patient, family, referring physician and all involved  care team members were briefed in  detail regarding the proceedings in the  Cath Lab.  All questions were answered.   Impression:  Normal coronary arteries  Normal left ventricular end-diastolic pressure   Plan:  1. Secondary prevention of coronary artery disease  2.  Medical management   Horton Finer, MD MSc  Assistant Professor of Medicine, School of [employer]  Interventional and [facility]  Coronary CTA IMPRESSION: 1. Fusiform aneurysmal dilation of the tubular portion of the ascending thoracic aorta with a maximal transverse diameter of 4.5 cm. Previous measurements from 02/08/2020 were limited by significant cardiac motion artifact. Recommend semi-annual imaging followup by CTA or MRA and referral to cardiothoracic surgery if not already obtained. This recommendation follows 2010 ACCF/AHA/AATS/ACR/ASA/SCA/SCAI/SIR/STS/SVM Guidelines for the Diagnosis and Management of Patients With Thoracic Aortic Disease. Circulation. 2010; 121: I627-O350. Aortic aneurysm NOS (ICD10-I71.9); Aortic Atherosclerosis (ICD10-170.0) 2. Coronary artery calcifications. 3. Mosaic attenuation of the lungs with areas of mild ground-glass attenuation airspace opacity and areas of probable air trapping. This suggests underlying small airways or small vessels disease. 4. Diffuse mild bronchial wall thickening suggests chronic bronchitis. 5. Cardiomegaly with trace interstitial pulmonary edema   Electronically Signed   By: Jacqulynn Cadet M.D.  Recent Labs: 02/02/2021: Magnesium 2.7 02/18/2021: ALT 9; B Natriuretic Peptide 4,369.1; BUN 63; Creatinine, Ser 8.30; Hemoglobin 7.5; Platelets 149; Potassium 5.9; Sodium 136  Recent Lipid Panel    Component Value Date/Time   CHOL 133 06/10/2019 0944   TRIG 59 06/10/2019 0944   HDL 46 06/10/2019 0944   CHOLHDL 2.9 06/10/2019 0944   CHOLHDL 2.8 02/17/2018 0345   VLDL 14 02/17/2018  0345   LDLCALC 74 06/10/2019 0944    Physical Exam:    VS:  BP 128/60 (BP Location: Right Arm, Patient Position: Sitting, Cuff Size: Normal)   Pulse 70   Ht 5\' 8"  (1.727 m)   Wt 135 lb (61.2 kg)  SpO2 94%   BMI 20.53 kg/m     Wt Readings from Last 3 Encounters:  03/06/21 135 lb (61.2 kg)  02/17/21 123 lb 7.3 oz (56 kg)  02/03/21 124 lb 9 oz (56.5 kg)     GEN: Well nourished, well developed in no acute distress HEENT: Normal NECK: No JVD; No carotid bruits LYMPHATICS: No lymphadenopathy CARDIAC: S1S2 noted,RRR, no murmurs, rubs, gallops RESPIRATORY:  Clear to auscultation without rales, wheezing or rhonchi  ABDOMEN: Soft, non-tender, non-distended, +bowel sounds, no guarding. EXTREMITIES: No edema, No cyanosis, no clubbing MUSCULOSKELETAL:  No deformity  SKIN: Warm and dry NEUROLOGIC:  Alert and oriented x 3, non-focal PSYCHIATRIC:  Normal affect, good insight  ASSESSMENT:    1. Essential hypertension   2. Thoracic ascending aortic aneurysm ([place])   3. Chronic diastolic heart failure ([place])   4. ESRD (end stage renal disease) on dialysis ([place])   5. Preoperative cardiovascular examination    PLAN:     He appears to be doing well from a cardiovascular standpoint.  His limitations are mostly based on his disabilities with his back.  He is planning upcoming back surgery, the patient does not have any unstable cardiac conditions.  Upon evaluation today, he can achieve 4 METs or greater without anginal symptoms.  According to [facility] and AHA guidelines, he requires no further cardiac workup prior to his noncardiac surgery and should be at acceptable risk.    He is back on track with his dialysis he is [facility] and he is getting his dialysis regularly.  Blood pressure is acceptable, continue with current antihypertensive regimen.   The patient is in agreement with the above plan. The patient left the office in stable condition.  The patient will  follow up in 6 months or sooner if needed.   Medication Adjustments/Labs and Tests Ordered: Current medicines are reviewed at length with the patient today.  Concerns regarding medicines are outlined above.  Orders Placed This Encounter  Procedures   EKG 12-Lead   No orders of the defined types were placed in this encounter.   Patient Instructions  Medication Instructions:  Your physician recommends that you continue on your current medications as directed. Please refer to the Current Medication list given to you today.  *If you need a refill on your cardiac medications before your next appointment, please call your pharmacy*   Lab Work: None If you have labs (blood work) drawn today and your tests are completely normal, you will receive your results only by: [place] (if you have MyChart) OR A paper copy in the mail If you have any lab test that is abnormal or we need to change your treatment, we will call you to review the results.   Testing/Procedures: None   Follow-Up: At [facility], you and your health needs are our priority.  As part of our continuing mission to provide you with exceptional heart care, we have created designated Provider Care Teams.  These Care Teams include your primary Cardiologist (physician) and Advanced Practice Providers (APPs -  Physician Assistants and Nurse Practitioners) who all work together to provide you with the care you need, when you need it.  We recommend signing up for the patient portal called "MyChart".  Sign up information is provided on this After Visit Summary.  MyChart is used to connect with patients for Virtual Visits (Telemedicine).  Patients are able to view lab/test results, encounter notes, upcoming appointments, etc.  Non-urgent messages  can be sent to your provider as well.   To learn more about what you can do with MyChart, go to [email or web address].    Your next appointment:   6 month(s)  The format for  your next appointment:   In Person  Provider:   [address] - Thomas Salines, DO    Other Instructions    Adopting a Healthy Lifestyle.  Know what a healthy weight is for you (roughly BMI <25) and aim to maintain this   Aim for 7+ servings of fruits and vegetables daily   65-80+ fluid ounces of water or unsweet tea for healthy kidneys   Limit to max 1 drink of alcohol per day; avoid smoking/tobacco   Limit animal fats in diet for cholesterol and heart health - choose grass fed whenever available   Avoid highly processed foods, and foods high in saturated/trans fats   Aim for low stress - take time to unwind and care for your mental health   Aim for 150 min of moderate intensity exercise weekly for heart health, and weights twice weekly for bone health   Aim for 7-9 hours of sleep daily   When it comes to diets, agreement about the perfect plan isnt easy to find, even among the experts. Experts at the [employer] developed an idea known as the Healthy Eating Plate. Just imagine a plate divided into logical, healthy portions.   The emphasis is on diet quality:   Load up on vegetables and fruits - one-half of your plate: Aim for color and variety, and remember that potatoes dont count.   Go for whole grains - one-quarter of your plate: Whole wheat, barley, wheat berries, quinoa, oats, brown rice, and foods made with them. If you want pasta, go with whole wheat pasta.   Protein power - one-quarter of your plate: Fish, chicken, beans, and nuts are all healthy, versatile protein sources. Limit red meat.   The diet, however, does go beyond the plate, offering a few other suggestions.   Use healthy plant oils, such as olive, canola, soy, corn, sunflower and peanut. Check the labels, and avoid partially hydrogenated oil, which have unhealthy trans fats.   If youre thirsty, drink water. Coffee and tea are good in moderation, but skip sugary drinks and limit  milk and dairy products to one or two daily servings.   The type of carbohydrate in the diet is more important than the amount. Some sources of carbohydrates, such as vegetables, fruits, whole grains, and beans-are healthier than others.   Finally, stay active  Signed, Thomas Salines, DO  03/06/2021 8:22 PM    [facility]

## 2021-03-07 ENCOUNTER — Telehealth: Payer: Self-pay | Admitting: Cardiology

## 2021-03-07 ENCOUNTER — Telehealth: Payer: Self-pay

## 2021-03-07 NOTE — Telephone Encounter (Signed)
    Pt is following up, wanted to make sure clearance was sent to Dr. Patrice Paradise spine specialist that's he is cleared for his procedure

## 2021-03-07 NOTE — Telephone Encounter (Signed)
   [facility] Pre-operative Risk Assessment    Patient Name: Thomas Robinson  DOB: 02/19/1961 MRN: [id number]  HEARTCARE STAFF:  - IMPORTANT!!!!!! Under Visit Info/Reason for Call, type in Other and utilize the format Clearance MM/DD/YY or Clearance TBD. Do not use dashes or single digits. - Please review there is not already an duplicate clearance open for this procedure. - If request is for dental extraction, please clarify the # of teeth to be extracted. - If the patient is currently at the dentist's office, call Pre-Op APP to address. If the patient is not currently in the dentist office, please route to the Pre-Op pool.  Request for surgical clearance:  What type of surgery is being performed?    Lumbar 4 - Sacral 1  Laminectomy & Fusion w/ TLIF, instrumentation, allograft  When is this surgery scheduled?  TBD  What type of clearance is required (medical clearance vs. Pharmacy clearance to hold med vs. Both)?  Medical  Are there any medications that need to be held prior to surgery and how long? No   Practice name and name of physician performing surgery?  Dr. Starling Manns, MD, [facility]  What is the office phone number?  [phone]   7.   What is the office fax number?  [phone]  8.   Anesthesia type (None, local, MAC, general) ?  General   Toni Arthurs 03/07/2021, 11:39 AM  _________________________________________________________________   (provider comments below)

## 2021-03-07 NOTE — Telephone Encounter (Signed)
Called patient to let him know we received the clearance forms from his provider's office and started the clearance process. Left information on voicemail.

## 2021-03-07 NOTE — Telephone Encounter (Signed)
Patient is following up to provide the phone number for Dr. Towanda Malkin office, [phone] (ext: [place]).

## 2021-03-07 NOTE — Telephone Encounter (Signed)
   Patient Name: Thomas Robinson  DOB: 1961/07/24  MRN: [id number]   Primary Cardiologist: [name], DO  Chart reviewed as part of pre-operative protocol coverage. Pre-op clearance already addressed by Dr. Harriet Masson yesterday. I will route this message and her office visit to requesting provider via Epic fax function and remove from pre-op pool. Please call with questions.  Charlie Pitter, PA-C 03/07/2021, 4:00 PM

## 2021-03-08 IMAGING — CR DG CHEST 2V
2 series · 2 of 2 positions shown · non-contrast
Comparison: 06/15/2020.

CLINICAL DATA: Chest pain.

EXAM:
CHEST - 2 VIEW

[chest pa]
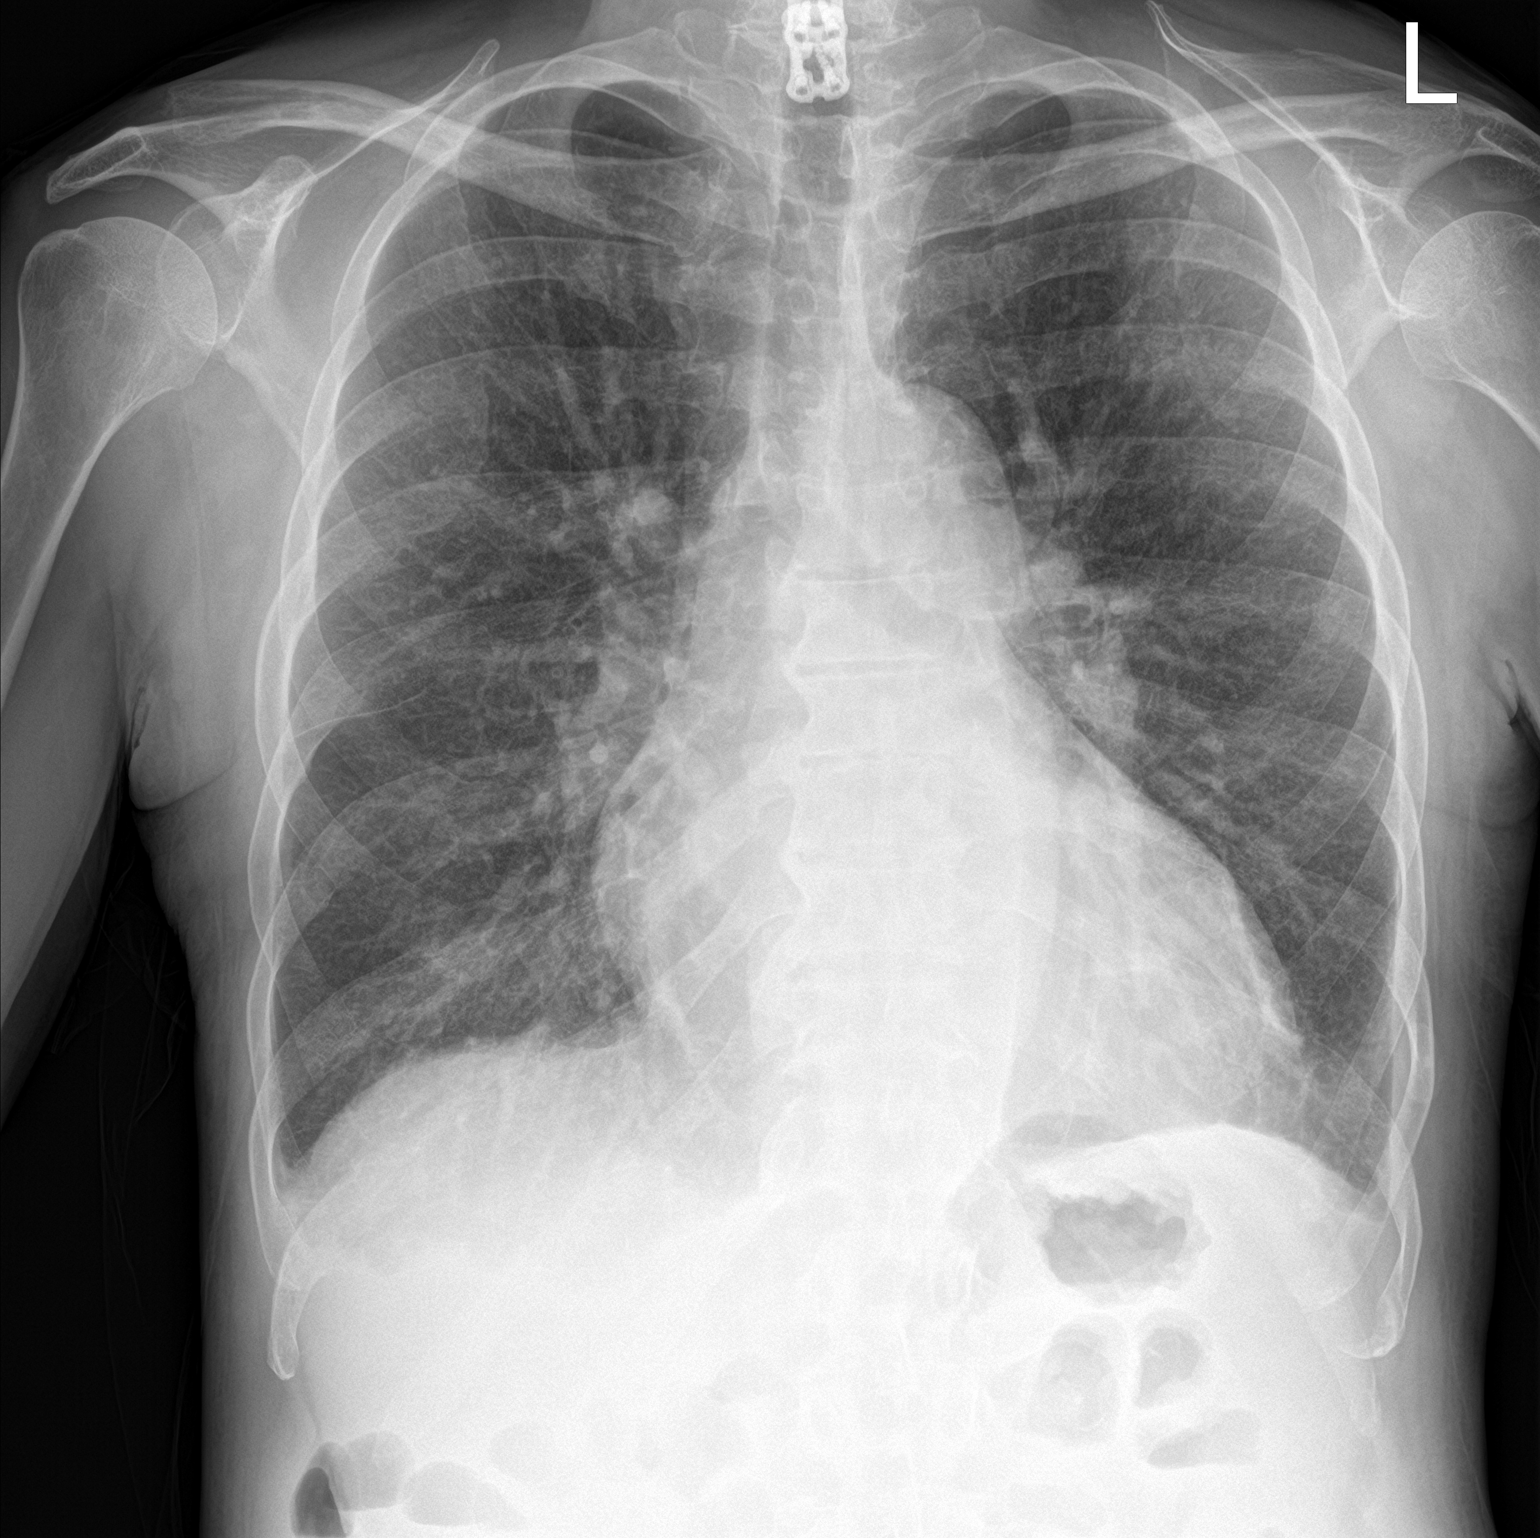

[chest lat]
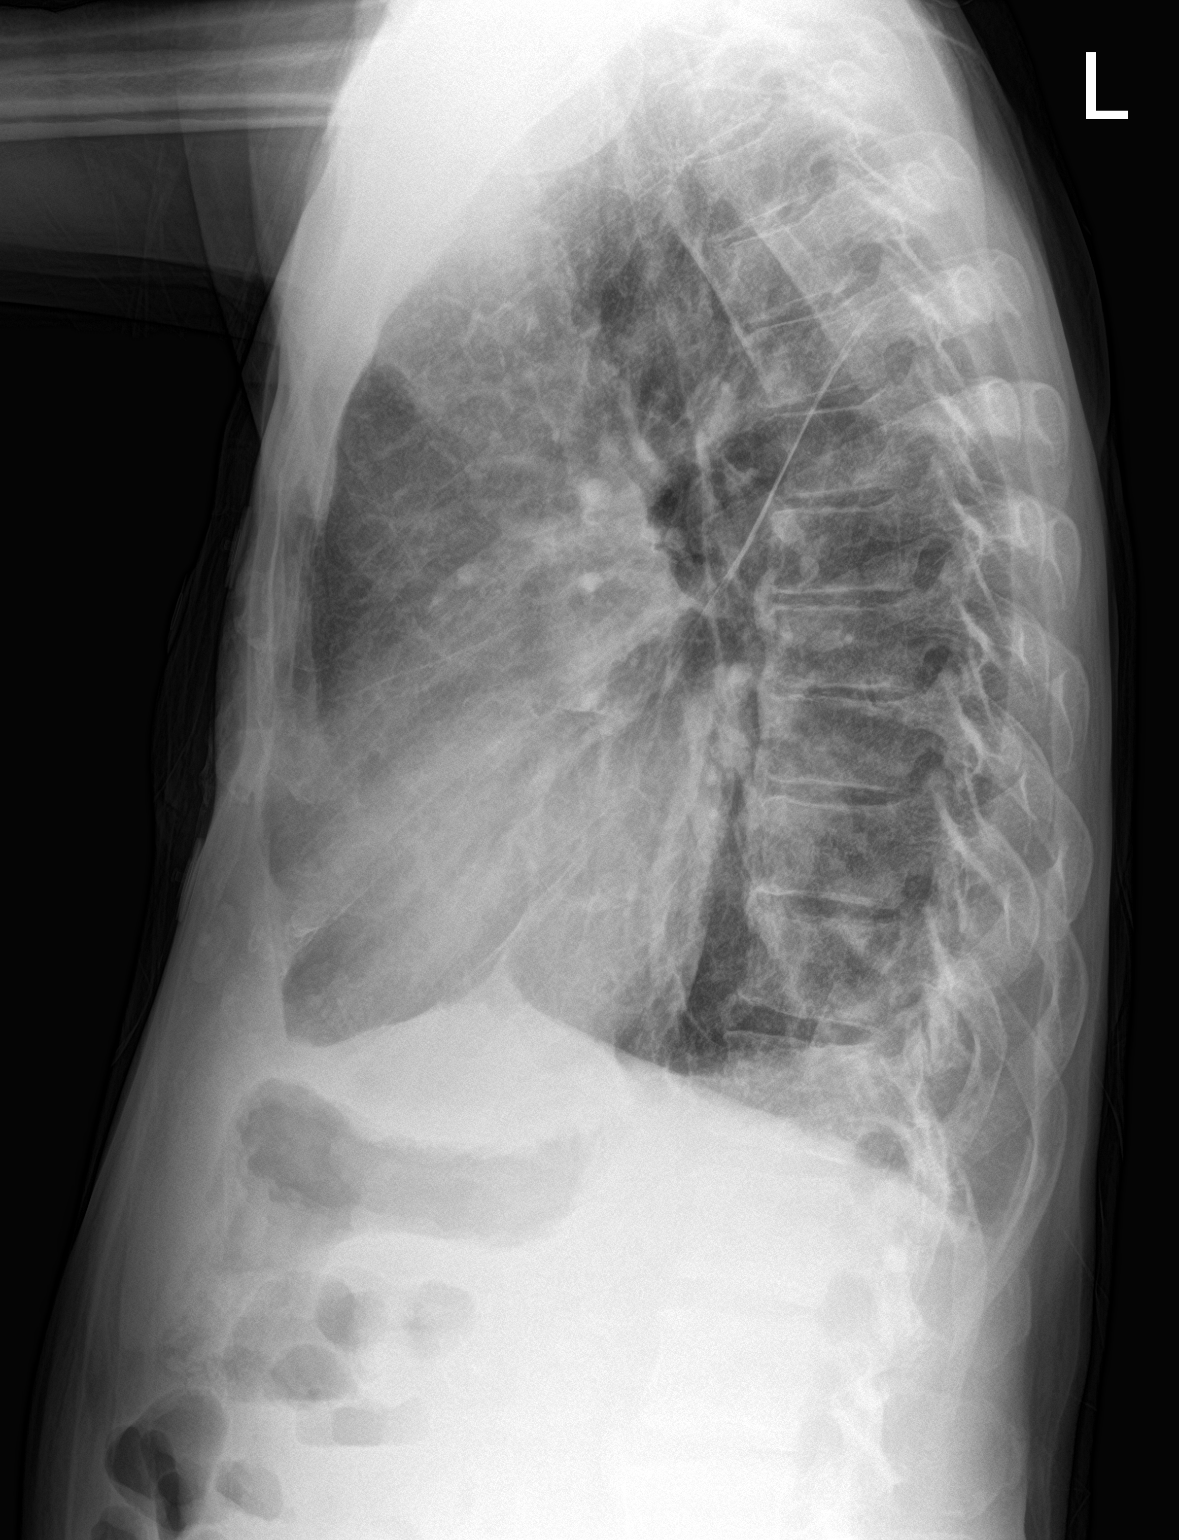

[2 of 2 positions shown; findings below may reference images not displayed]

FINDINGS: Cardiomegaly with pulmonary venous congestion and bilateral
interstitial prominence suggesting CHF. Pneumonitis cannot be
excluded. Interstitial prominence has increased from prior study of
06/15/2020. Small bilateral pleural effusions. No pneumothorax.
Prior cervical spine fusion. Degenerative change thoracic spine.
IMPRESSION: Cardiomegaly with pulmonary venous congestion and bilateral
interstitial prominence suggesting CHF. Pneumonitis cannot be
excluded. Interstitial prominence has increased from prior study of
06/15/2020. Small bilateral pleural effusions.

## 2021-03-08 IMAGING — DX DG FOOT COMPLETE 3+V*R*
3 series · 3 of 3 positions shown · non-contrast
Comparison: No comparison studies available.

CLINICAL DATA: Pain and swelling.  3rd and fourth toes are blue.

EXAM:
RIGHT FOOT COMPLETE - 3+ VIEW

[x foot ap right]
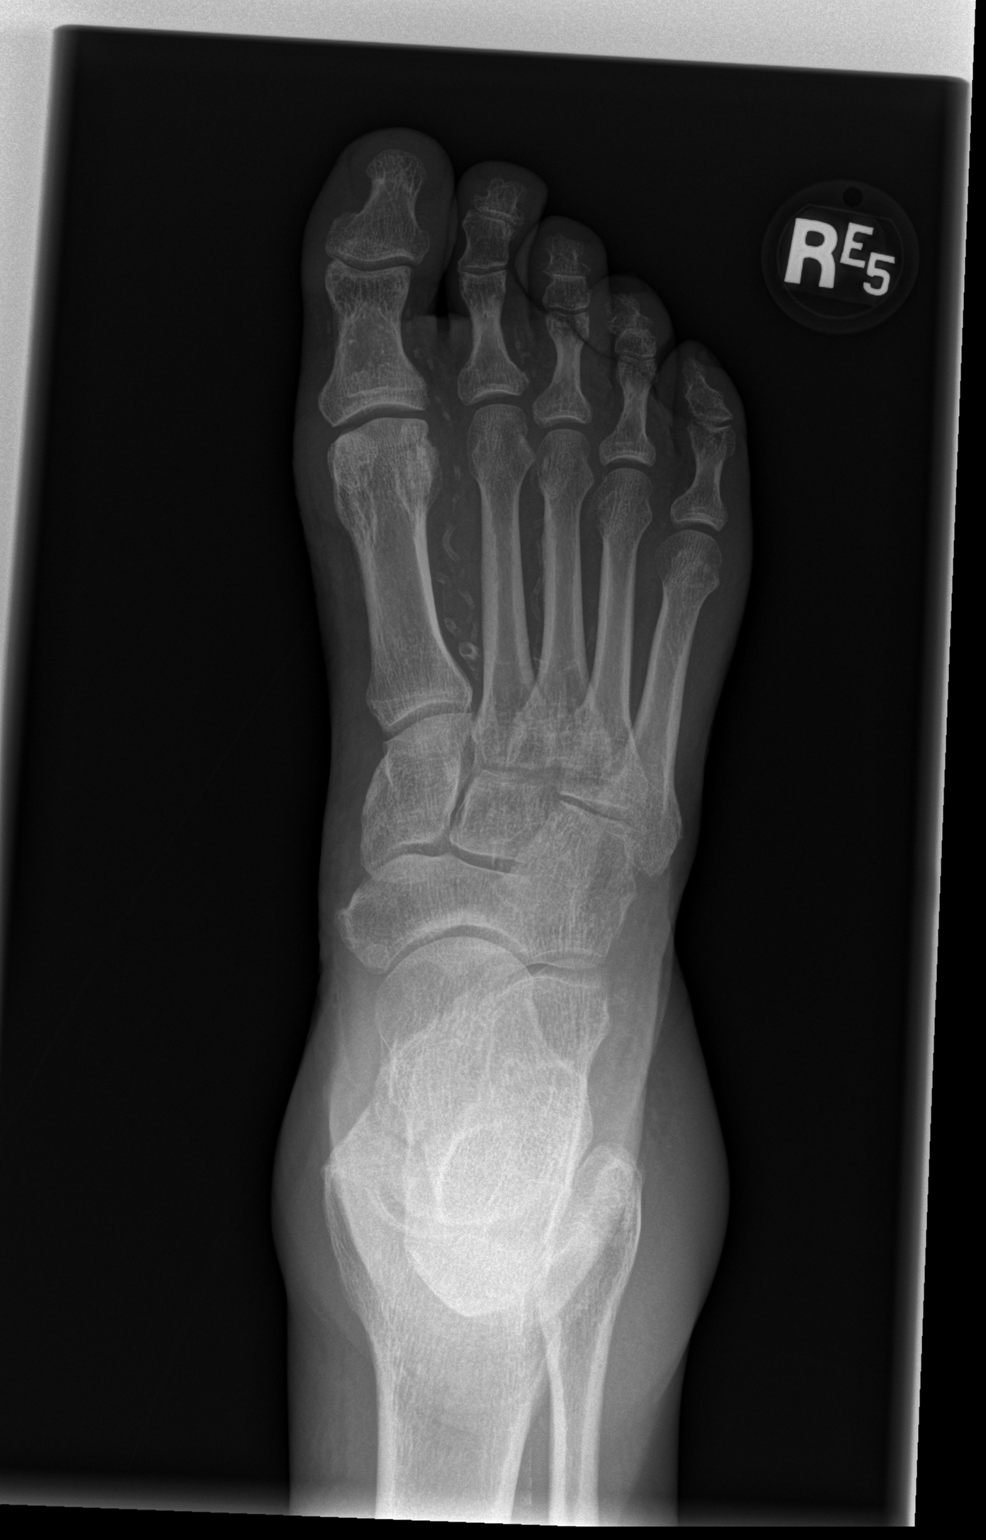

[x foot obl right]
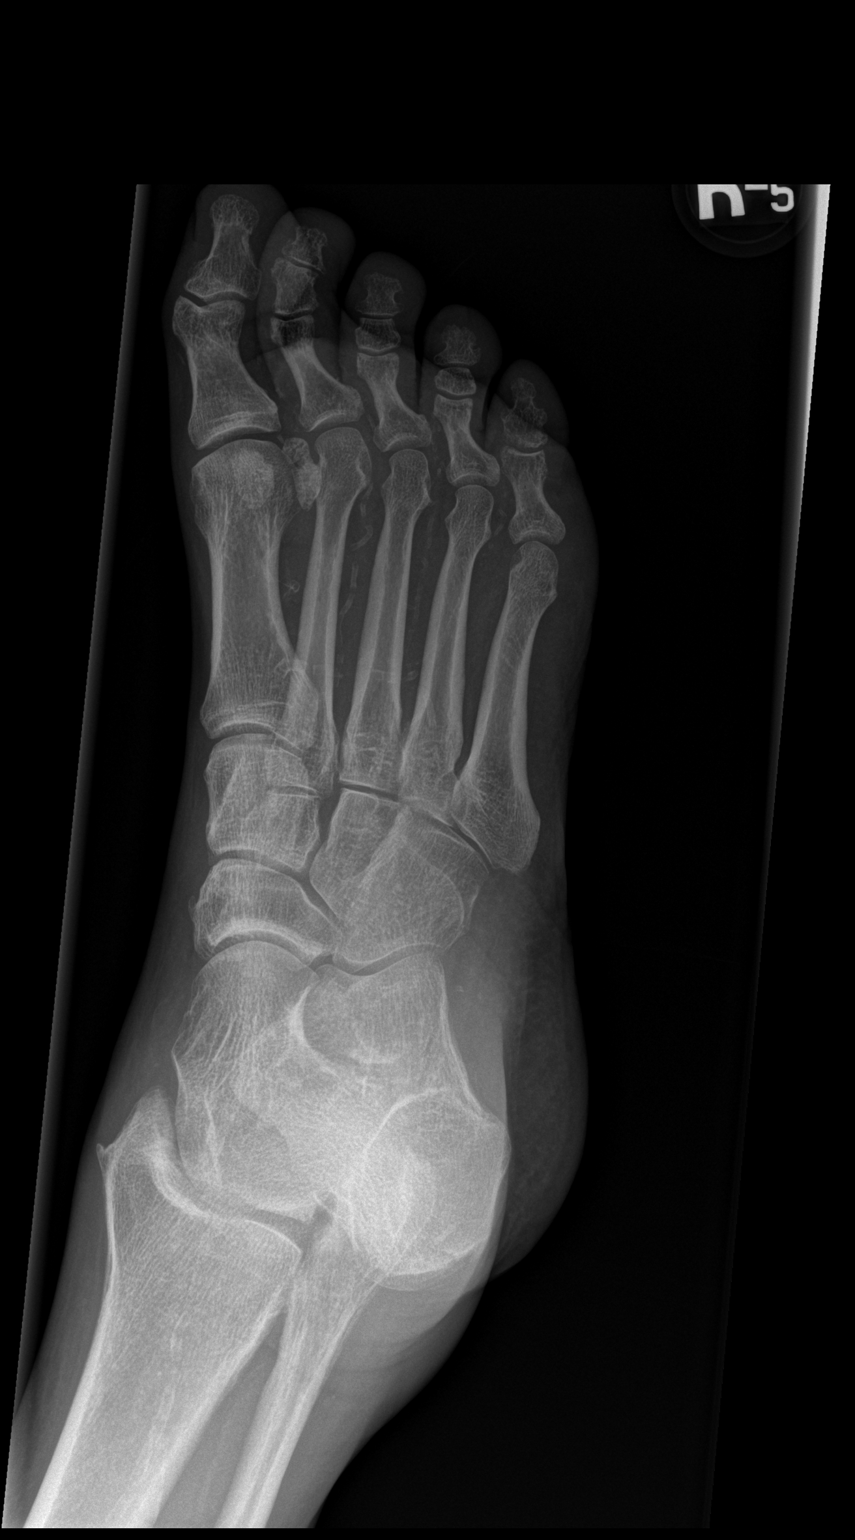

[x foot lat right]
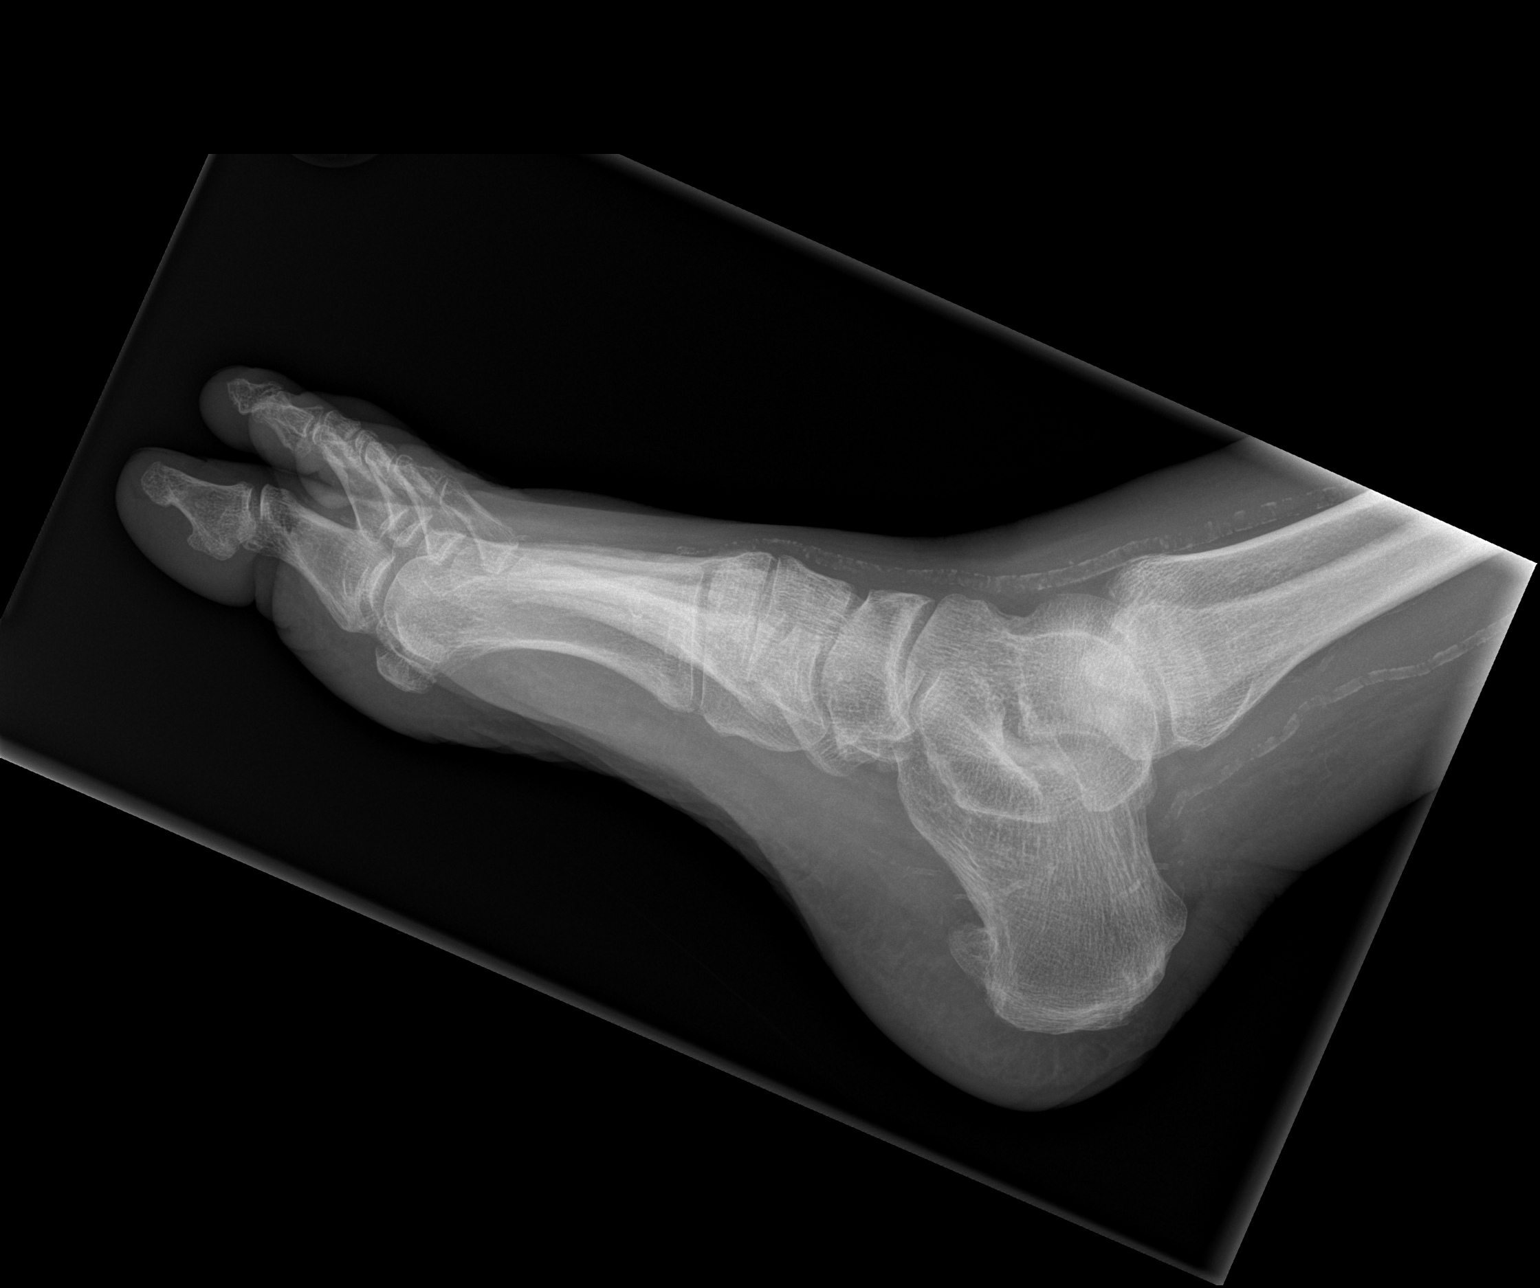

[3 of 3 positions shown; findings below may reference images not displayed]

FINDINGS: No evidence for fracture. No subluxation or dislocation. No bony
destruction or erosion to suggest osteomyelitis.
IMPRESSION: Negative.

## 2021-03-11 ENCOUNTER — Other Ambulatory Visit (HOSPITAL_COMMUNITY): Payer: Medicare Other

## 2021-03-11 ENCOUNTER — Encounter: Payer: Medicare Other | Admitting: Surgery

## 2021-03-11 ENCOUNTER — Encounter (HOSPITAL_COMMUNITY): Payer: Medicare Other

## 2021-03-18 ENCOUNTER — Other Ambulatory Visit: Payer: Self-pay

## 2021-03-18 DIAGNOSIS — Z992 Dependence on renal dialysis: Secondary | ICD-10-CM

## 2021-03-18 DIAGNOSIS — N186 End stage renal disease: Secondary | ICD-10-CM

## 2021-03-21 IMAGING — DX DG CHEST 1V PORT
1 series · 1 of 1 positions shown · non-contrast
Comparison: 07/03/2020

CLINICAL DATA: Shortness of breath

EXAM:
PORTABLE CHEST 1 VIEW

[chest]
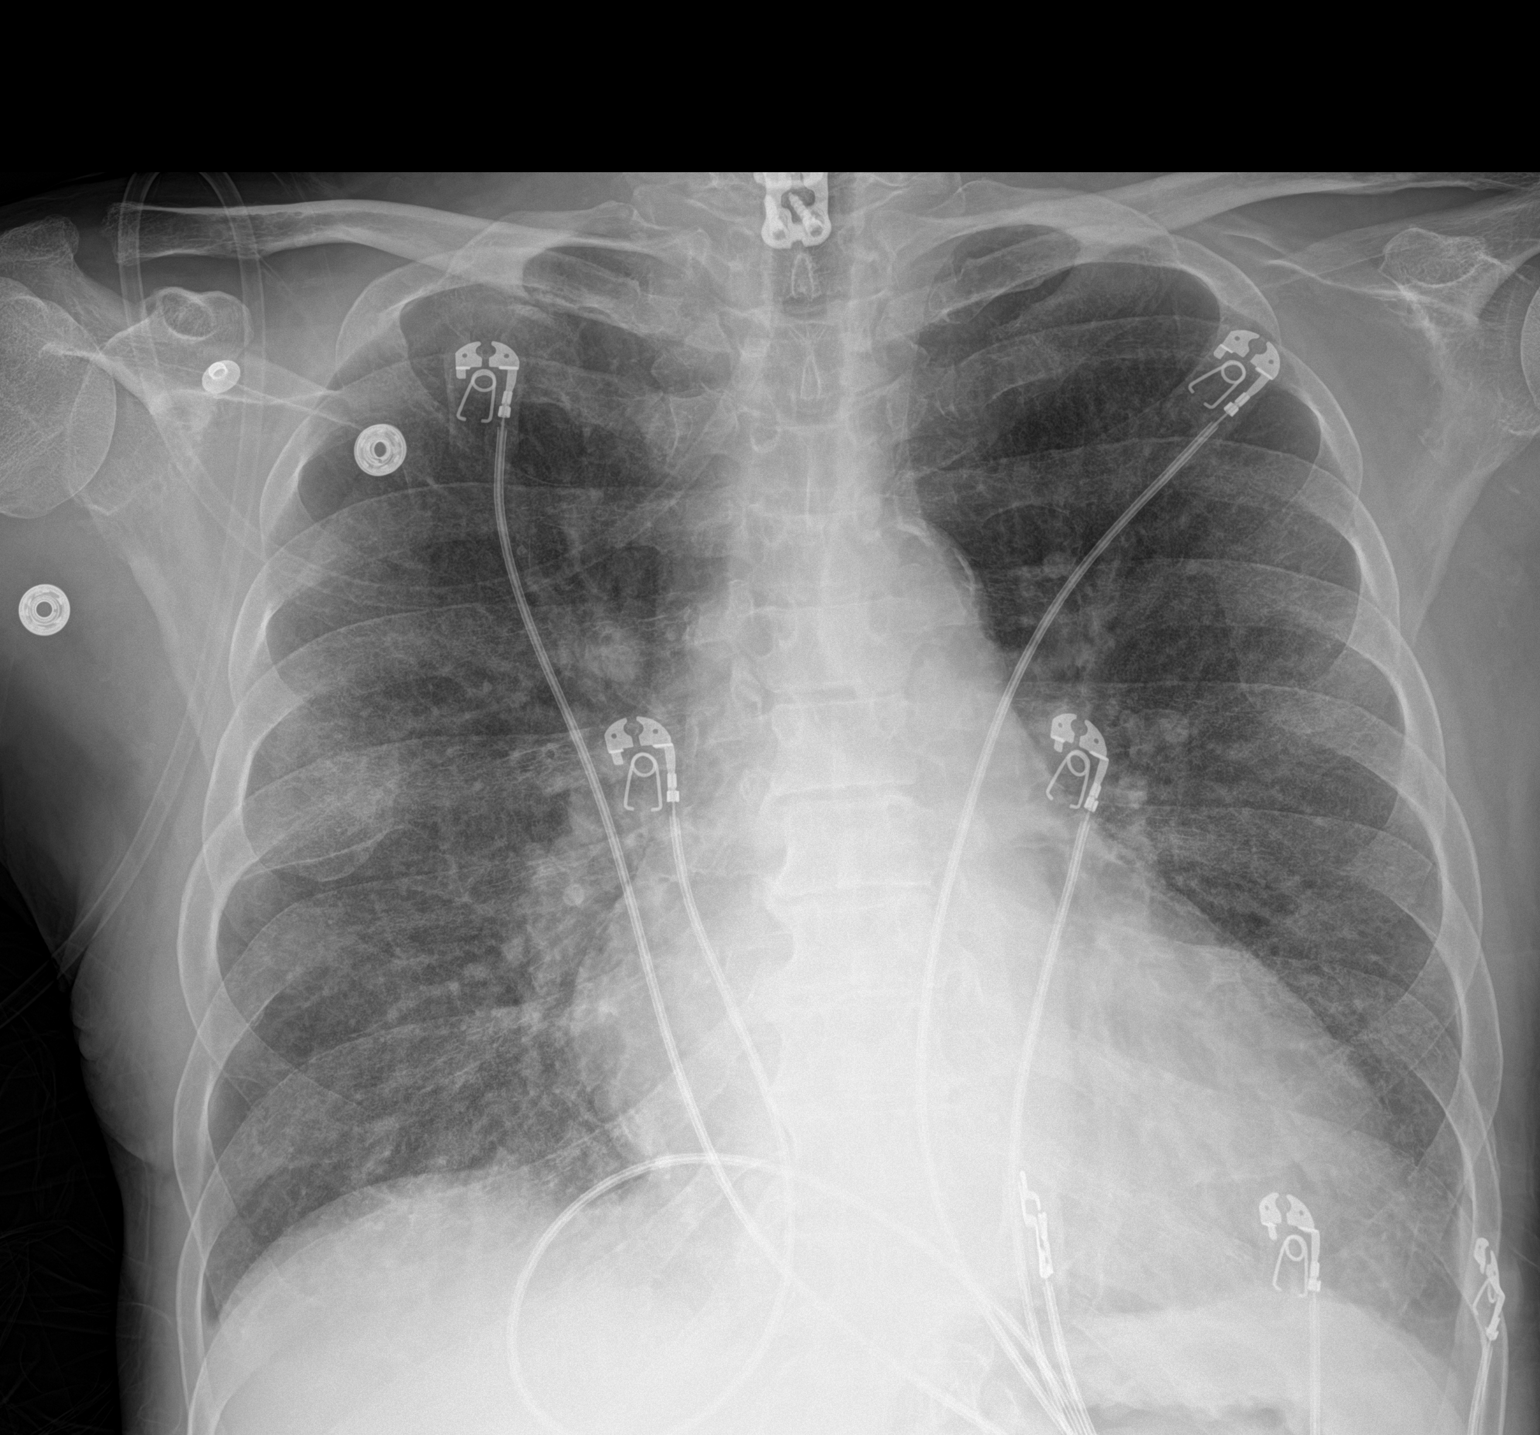

[1 of 1 positions shown; findings below may reference images not displayed]

FINDINGS: The heart size and mediastinal contours are within normal limits.
Both lungs are clear. The visualized skeletal structures are
unremarkable.
IMPRESSION: No active disease.

## 2021-03-25 ENCOUNTER — Other Ambulatory Visit: Payer: Self-pay | Admitting: *Deleted

## 2021-03-25 ENCOUNTER — Encounter: Payer: Self-pay | Admitting: *Deleted

## 2021-03-25 ENCOUNTER — Ambulatory Visit (INDEPENDENT_AMBULATORY_CARE_PROVIDER_SITE_OTHER): Payer: Medicare Other | Admitting: Surgery

## 2021-03-25 ENCOUNTER — Ambulatory Visit (HOSPITAL_COMMUNITY)
Admission: RE | Admit: 2021-03-25 | Discharge: 2021-03-25 | Disposition: A | Discharge status: Home / Self Care | Payer: Medicare Other | Source: Ambulatory Visit | Attending: Surgery | Admitting: Surgery

## 2021-03-25 ENCOUNTER — Encounter: Payer: Self-pay | Admitting: Surgery

## 2021-03-25 ENCOUNTER — Other Ambulatory Visit: Payer: Self-pay

## 2021-03-25 ENCOUNTER — Ambulatory Visit (INDEPENDENT_AMBULATORY_CARE_PROVIDER_SITE_OTHER)
Admission: RE | Admit: 2021-03-25 | Discharge: 2021-03-25 | Disposition: A | Discharge status: Home / Self Care | Payer: Medicare Other | Source: Ambulatory Visit | Attending: Surgery | Admitting: Surgery

## 2021-03-25 VITALS — BP 185/87 | HR 85 | Temp 98.6°F | Resp 20 | Ht 68.0 in | Wt 125.0 lb

## 2021-03-25 DIAGNOSIS — N186 End stage renal disease: Secondary | ICD-10-CM

## 2021-03-25 DIAGNOSIS — Z992 Dependence on renal dialysis: Secondary | ICD-10-CM

## 2021-03-25 NOTE — H&P (View-Only) (Signed)
Patient name: Thomas Robinson MRN: [id number] DOB: 06-May-1961 Sex: male  REASON FOR VISIT:     Post op  HISTORY OF PRESENT ILLNESS:   Thomas Robinson is a 60 y.o. male with end-stage renal disease who had conversion of a left radiocephalic fistula to a brachiocephalic fistula for edema.  Despite what appeared to be a widely patent central venous system, he had persistent edema and ultimately developed ulceration which led to ligation of his fistula and catheter placement.  He is back today to discuss access options.  CURRENT MEDICATIONS:    Current Outpatient Medications  Medication Sig Dispense Refill   acetaminophen (TYLENOL) 500 MG tablet Take 500 mg by mouth every 6 (six) hours as needed for moderate pain or headache.     albuterol (VENTOLIN HFA) 108 (90 Base) MCG/ACT inhaler Inhale 2 puffs into the lungs in the morning and at bedtime.     amLODipine (NORVASC) 10 MG tablet Take 10 mg by mouth daily.      BREO ELLIPTA 100-25 MCG/INH AEPB Inhale 1 puff into the lungs 2 (two) times daily as needed (sob/wheezing).     cholecalciferol (VITAMIN D3) 25 MCG (1000 UNIT) tablet Take 1,000 Units by mouth once a week.     DIPHENHYDRAMINE HCL PO Take 25 mg by mouth as needed (allergies).     doxercalciferol (HECTOROL) 0.5 MCG capsule Take 0.5 mcg by mouth 3 (three) times a week.     furosemide (LASIX) 80 MG tablet Take 80 mg by mouth as directed. Take Daily PRN On Non-Dialysis Days (Sun, Tues, Thurs, Sat)     hydrALAZINE (APRESOLINE) 25 MG tablet Take 50 mg by mouth in the morning and at bedtime.     metoprolol tartrate (LOPRESSOR) 100 MG tablet Take 0.5 tablets (50 mg total) by mouth 2 (two) times daily. (Patient taking differently: Take 100 mg by mouth daily.)     multivitamin (RENA-VIT) TABS tablet Take 1 tablet by mouth daily.     pregabalin (LYRICA) 25 MG capsule Take 25 mg by mouth daily as needed (pain).     sevelamer carbonate (RENVELA) 800 MG tablet  Take 800-1,600 mg by mouth daily as needed (with snacks or meals). With Snacks & Meals     traMADol (ULTRAM) 50 MG tablet Take 1 tablet (50 mg total) by mouth every 6 (six) hours as needed for moderate pain. 20 tablet 0   No current facility-administered medications for this visit.    REVIEW OF SYSTEMS:   [X]  denotes positive finding, [ ]  denotes negative finding Cardiac  Comments:  Chest pain or chest pressure:    Shortness of breath upon exertion:    Short of breath when lying flat:    Irregular heart rhythm:    Constitutional    Fever or chills:      PHYSICAL EXAM:   Vitals:   03/25/21 1433  BP: (!) 185/87  Pulse: 85  Resp: 20  Temp: 98.6 F (37 C)  SpO2: 91%  Weight: 125 lb (56.7 kg)  Height: 5\' 8"  (1.727 m)    GENERAL: The patient is a well-nourished male, in no acute distress. The vital signs are documented above. CARDIOVASCULAR: There is a regular rate and rhythm. PULMONARY: Non-labored respirations   STUDIES:    +-----------------+-------------+----------+--------+  Right Cephalic   Diameter (cm)Depth (cm)Findings  +-----------------+-------------+----------+--------+  Shoulder             0.22                         +-----------------+-------------+----------+--------+  Prox upper arm       0.16                         +-----------------+-------------+----------+--------+  Mid upper arm        0.23                         +-----------------+-------------+----------+--------+  Dist upper arm       0.18                         +-----------------+-------------+----------+--------+  Antecubital fossa    0.20                         +-----------------+-------------+----------+--------+  Prox forearm         0.20                         +-----------------+-------------+----------+--------+  Mid forearm          0.13                         +-----------------+-------------+----------+--------+  Dist forearm          0.15                         +-----------------+-------------+----------+--------+   +-----------------+-------------+----------+--------+  Right Basilic    Diameter (cm)Depth (cm)Findings  +-----------------+-------------+----------+--------+  Prox upper arm       0.64                         +-----------------+-------------+----------+--------+  Mid upper arm        0.38                         +-----------------+-------------+----------+--------+  Dist upper arm       0.28                         +-----------------+-------------+----------+--------+  Antecubital fossa    0.20                         +-----------------+-------------+----------+--------+  Prox forearm         0.18                          MEDICAL ISSUES:   End-stage renal disease: I discussed with the patient that I would no longer recommend access on the left arm.  We discussed proceeding with a right arm fistula versus graft.  Based off of his vein mapping, he may be a candidate for a staged basilic vein fistula, if not I discussed placing a forearm or upper arm graft based off the size of his brachial and axillary veins.  He wants to get this done as soon as possible and so I am scheduling him for Thursday August 4th.  Leia Alf, MD, FACS [facility] [phone] Pager [phone]

## 2021-03-25 NOTE — Progress Notes (Signed)
Patient name: Thomas Robinson MRN: [id number] DOB: [birth year] Sex: male  REASON FOR VISIT:     Post op  HISTORY OF PRESENT ILLNESS:   Thomas Robinson is a 60 y.o. male with end-stage renal disease who had conversion of a left radiocephalic fistula to a brachiocephalic fistula for edema.  Despite what appeared to be a widely patent central venous system, he had persistent edema and ultimately developed ulceration which led to ligation of his fistula and catheter placement.  He is back today to discuss access options.  CURRENT MEDICATIONS:    Current Outpatient Medications  Medication Sig Dispense Refill   acetaminophen (TYLENOL) 500 MG tablet Take 500 mg by mouth every 6 (six) hours as needed for moderate pain or headache.     albuterol (VENTOLIN HFA) 108 (90 Base) MCG/ACT inhaler Inhale 2 puffs into the lungs in the morning and at bedtime.     amLODipine (NORVASC) 10 MG tablet Take 10 mg by mouth daily.      BREO ELLIPTA 100-25 MCG/INH AEPB Inhale 1 puff into the lungs 2 (two) times daily as needed (sob/wheezing).     cholecalciferol (VITAMIN D3) 25 MCG (1000 UNIT) tablet Take 1,000 Units by mouth once a week.     DIPHENHYDRAMINE HCL PO Take 25 mg by mouth as needed (allergies).     doxercalciferol (HECTOROL) 0.5 MCG capsule Take 0.5 mcg by mouth 3 (three) times a week.     furosemide (LASIX) 80 MG tablet Take 80 mg by mouth as directed. Take Daily PRN On Non-Dialysis Days (Sun, Tues, Thurs, Sat)     hydrALAZINE (APRESOLINE) 25 MG tablet Take 50 mg by mouth in the morning and at bedtime.     metoprolol tartrate (LOPRESSOR) 100 MG tablet Take 0.5 tablets (50 mg total) by mouth 2 (two) times daily. (Patient taking differently: Take 100 mg by mouth daily.)     multivitamin (RENA-VIT) TABS tablet Take 1 tablet by mouth daily.     pregabalin (LYRICA) 25 MG capsule Take 25 mg by mouth daily as needed (pain).     sevelamer carbonate (RENVELA) 800 MG tablet  Take 800-1,600 mg by mouth daily as needed (with snacks or meals). With Snacks & Meals     traMADol (ULTRAM) 50 MG tablet Take 1 tablet (50 mg total) by mouth every 6 (six) hours as needed for moderate pain. 20 tablet 0   No current facility-administered medications for this visit.    REVIEW OF SYSTEMS:   [X]  denotes positive finding, [ ]  denotes negative finding Cardiac  Comments:  Chest pain or chest pressure:    Shortness of breath upon exertion:    Short of breath when lying flat:    Irregular heart rhythm:    Constitutional    Fever or chills:      PHYSICAL EXAM:   Vitals:   03/25/21 1433  BP: (!) 185/87  Pulse: 85  Resp: 20  Temp: 98.6 F (37 C)  SpO2: 91%  Weight: 125 lb (56.7 kg)  Height: 5\' 8"  (1.727 m)    GENERAL: The patient is a well-nourished male, in no acute distress. The vital signs are documented above. CARDIOVASCULAR: There is a regular rate and rhythm. PULMONARY: Non-labored respirations   STUDIES:    +-----------------+-------------+----------+--------+  Right Cephalic   Diameter (cm)Depth (cm)Findings  +-----------------+-------------+----------+--------+  Shoulder             0.22                         +-----------------+-------------+----------+--------+  Prox upper arm       0.16                         +-----------------+-------------+----------+--------+  Mid upper arm        0.23                         +-----------------+-------------+----------+--------+  Dist upper arm       0.18                         +-----------------+-------------+----------+--------+  Antecubital fossa    0.20                         +-----------------+-------------+----------+--------+  Prox forearm         0.20                         +-----------------+-------------+----------+--------+  Mid forearm          0.13                         +-----------------+-------------+----------+--------+  Dist forearm          0.15                         +-----------------+-------------+----------+--------+   +-----------------+-------------+----------+--------+  Right Basilic    Diameter (cm)Depth (cm)Findings  +-----------------+-------------+----------+--------+  Prox upper arm       0.64                         +-----------------+-------------+----------+--------+  Mid upper arm        0.38                         +-----------------+-------------+----------+--------+  Dist upper arm       0.28                         +-----------------+-------------+----------+--------+  Antecubital fossa    0.20                         +-----------------+-------------+----------+--------+  Prox forearm         0.18                          MEDICAL ISSUES:   End-stage renal disease: I discussed with the patient that I would no longer recommend access on the left arm.  We discussed proceeding with a right arm fistula versus graft.  Based off of his vein mapping, he may be a candidate for a staged basilic vein fistula, if not I discussed placing a forearm or upper arm graft based off the size of his brachial and axillary veins.  He wants to get this done as soon as possible and so I am scheduling him for Thursday August 4th.  Leia Alf, MD, FACS [facility] [phone] Pager [phone]

## 2021-03-29 ENCOUNTER — Emergency Department (HOSPITAL_COMMUNITY): Payer: Medicare Other

## 2021-03-29 ENCOUNTER — Other Ambulatory Visit: Payer: Self-pay

## 2021-03-29 ENCOUNTER — Emergency Department (HOSPITAL_COMMUNITY)
Admission: EM | Admit: 2021-03-29 | Discharge: 2021-03-29 | Disposition: A | Discharge status: Home / Self Care | Payer: Medicare Other | Attending: Emergency Medicine | Admitting: Emergency Medicine

## 2021-03-29 ENCOUNTER — Encounter (HOSPITAL_COMMUNITY): Payer: Self-pay

## 2021-03-29 DIAGNOSIS — E1142 Type 2 diabetes mellitus with diabetic polyneuropathy: Secondary | ICD-10-CM | POA: Insufficient documentation

## 2021-03-29 DIAGNOSIS — J45909 Unspecified asthma, uncomplicated: Secondary | ICD-10-CM | POA: Diagnosis not present

## 2021-03-29 DIAGNOSIS — E785 Hyperlipidemia, unspecified: Secondary | ICD-10-CM | POA: Insufficient documentation

## 2021-03-29 DIAGNOSIS — Z20822 Contact with and (suspected) exposure to covid-19: Secondary | ICD-10-CM | POA: Diagnosis not present

## 2021-03-29 DIAGNOSIS — Z79899 Other long term (current) drug therapy: Secondary | ICD-10-CM | POA: Diagnosis not present

## 2021-03-29 DIAGNOSIS — R0602 Shortness of breath: Secondary | ICD-10-CM | POA: Diagnosis present

## 2021-03-29 DIAGNOSIS — J810 Acute pulmonary edema: Secondary | ICD-10-CM | POA: Insufficient documentation

## 2021-03-29 DIAGNOSIS — E1136 Type 2 diabetes mellitus with diabetic cataract: Secondary | ICD-10-CM | POA: Insufficient documentation

## 2021-03-29 DIAGNOSIS — N186 End stage renal disease: Secondary | ICD-10-CM | POA: Diagnosis not present

## 2021-03-29 DIAGNOSIS — E1169 Type 2 diabetes mellitus with other specified complication: Secondary | ICD-10-CM | POA: Diagnosis not present

## 2021-03-29 DIAGNOSIS — I132 Hypertensive heart and chronic kidney disease with heart failure and with stage 5 chronic kidney disease, or end stage renal disease: Secondary | ICD-10-CM | POA: Insufficient documentation

## 2021-03-29 DIAGNOSIS — Z992 Dependence on renal dialysis: Secondary | ICD-10-CM | POA: Insufficient documentation

## 2021-03-29 DIAGNOSIS — I5032 Chronic diastolic (congestive) heart failure: Secondary | ICD-10-CM | POA: Diagnosis not present

## 2021-03-29 LAB — I-STAT CHEM 8, ED
BUN: 54 mg/dL — ABNORMAL HIGH (ref 8–23)
Calcium, Ion: 1.15 mmol/L (ref 1.15–1.40)
Chloride: 106 mmol/L (ref 98–111)
Creatinine, Ser: 9.6 mg/dL — ABNORMAL HIGH (ref 0.61–1.24)
Glucose, Bld: 101 mg/dL — ABNORMAL HIGH (ref 70–99)
HCT: 26 % — ABNORMAL LOW (ref 39.0–52.0)
Hemoglobin: 8.8 g/dL — ABNORMAL LOW (ref 13.0–17.0)
Potassium: 5.3 mmol/L — ABNORMAL HIGH (ref 3.5–5.1)
Sodium: 139 mmol/L (ref 135–145)
TCO2: 25 mmol/L (ref 22–32)

## 2021-03-29 LAB — CBC WITH DIFFERENTIAL/PLATELET
Abs Immature Granulocytes: 0.02 10*3/uL (ref 0.00–0.07)
Basophils Absolute: 0.1 10*3/uL (ref 0.0–0.1)
Basophils Relative: 1 %
Eosinophils Absolute: 0.1 10*3/uL (ref 0.0–0.5)
Eosinophils Relative: 2 %
HCT: 28.4 % — ABNORMAL LOW (ref 39.0–52.0)
Hemoglobin: 9 g/dL — ABNORMAL LOW (ref 13.0–17.0)
Immature Granulocytes: 0 %
Lymphocytes Relative: 12 %
Lymphs Abs: 0.6 10*3/uL — ABNORMAL LOW (ref 0.7–4.0)
MCH: 34.6 pg — ABNORMAL HIGH (ref 26.0–34.0)
MCHC: 31.7 g/dL (ref 30.0–36.0)
MCV: 109.2 fL — ABNORMAL HIGH (ref 80.0–100.0)
Monocytes Absolute: 0.5 10*3/uL (ref 0.1–1.0)
Monocytes Relative: 9 %
Neutro Abs: 3.8 10*3/uL (ref 1.7–7.7)
Neutrophils Relative %: 76 %
Platelets: 158 10*3/uL (ref 150–400)
RBC: 2.6 MIL/uL — ABNORMAL LOW (ref 4.22–5.81)
RDW: 16.3 % — ABNORMAL HIGH (ref 11.5–15.5)
WBC: 5.1 10*3/uL (ref 4.0–10.5)
nRBC: 0 % (ref 0.0–0.2)

## 2021-03-29 LAB — BASIC METABOLIC PANEL
Anion gap: 15 (ref 5–15)
BUN: 60 mg/dL — ABNORMAL HIGH (ref 8–23)
CO2: 23 mmol/L (ref 22–32)
Calcium: 10.2 mg/dL (ref 8.9–10.3)
Chloride: 103 mmol/L (ref 98–111)
Creatinine, Ser: 8.84 mg/dL — ABNORMAL HIGH (ref 0.61–1.24)
GFR, Estimated: 6 mL/min — ABNORMAL LOW (ref >60)
Glucose, Bld: 104 mg/dL — ABNORMAL HIGH (ref 70–99)
Potassium: 5.5 mmol/L — ABNORMAL HIGH (ref 3.5–5.1)
Sodium: 141 mmol/L (ref 135–145)

## 2021-03-29 LAB — RESP PANEL BY RT-PCR (FLU A&B, COVID) ARPGX2
Influenza A by PCR: NEGATIVE
Influenza B by PCR: NEGATIVE
SARS Coronavirus 2 by RT PCR: NEGATIVE

## 2021-03-29 LAB — TROPONIN I (HIGH SENSITIVITY): Troponin I (High Sensitivity): 60 ng/L — ABNORMAL HIGH (ref <18)

## 2021-03-29 LAB — BRAIN NATRIURETIC PEPTIDE: B Natriuretic Peptide: >4500 pg/mL — ABNORMAL HIGH (ref 0.0–100.0)

## 2021-03-29 MED ORDER — PENTAFLUOROPROP-TETRAFLUOROETH EX AERO: 1.0000 "application " | INHALATION_SPRAY | CUTANEOUS | Status: DC | PRN

## 2021-03-29 MED ORDER — LIDOCAINE-PRILOCAINE 2.5-2.5 % EX CREA: 1.0000 "application " | TOPICAL_CREAM | CUTANEOUS | Status: DC | PRN

## 2021-03-29 MED ORDER — CHLORHEXIDINE GLUCONATE CLOTH 2 % EX PADS: 6.0000 | MEDICATED_PAD | Freq: Every day | CUTANEOUS | Status: DC

## 2021-03-29 MED ORDER — HEPARIN SODIUM (PORCINE) 1000 UNIT/ML DIALYSIS: 1000.0000 [IU] | INTRAMUSCULAR | Status: DC | PRN

## 2021-03-29 MED ORDER — SODIUM CHLORIDE 0.9 % IV SOLN: 100.0000 mL | INTRAVENOUS | Status: DC | PRN

## 2021-03-29 MED ORDER — LIDOCAINE HCL (PF) 1 % IJ SOLN: 5.0000 mL | INTRAMUSCULAR | Status: DC | PRN

## 2021-03-29 MED ORDER — ALTEPLASE 2 MG IJ SOLR: 2.0000 mg | Freq: Once | INTRAMUSCULAR | Status: DC | PRN

## 2021-03-29 MED ORDER — HEPARIN SODIUM (PORCINE) 1000 UNIT/ML IJ SOLN
INTRAMUSCULAR | Status: AC
  Administered 2021-03-29: 1000 [IU]
  Filled 2021-03-29: qty 4

## 2021-03-29 NOTE — Progress Notes (Signed)
Pt placed on 5L [place], Sp02 96%.  Clear BBS. Pt talking in full sentences. Bipap on standby if needed.

## 2021-03-29 NOTE — Procedures (Signed)
   I was present at this dialysis session, have reviewed the session itself and made  appropriate changes [name] MD [place] pager [phone]   03/29/2021, 4:46 PM

## 2021-03-29 NOTE — Discharge Instructions (Addendum)
You have fluid in your lungs.  You just received dialysis today.  Please go to your dialysis session as scheduled on Monday.  Return to ER if you have worse shortness of breath, chest pain

## 2021-03-29 NOTE — ED Notes (Signed)
Still off unit in HD suite

## 2021-03-29 NOTE — ED Provider Notes (Signed)
[facility] EMERGENCY DEPARTMENT Provider Note   CSN: [id number] Arrival date & time: 03/29/21  0156     History Chief Complaint  Patient presents with   Respiratory Distress    Thomas Robinson is a 60 y.o. male.  60 yo M with a chief complaints of shortness of breath.  This started abruptly this evening.  He unfortunately missed dialysis on Wednesday because he had to get a new vehicle.  Otherwise states he has been compliant with his dialysis.  Severely short of breath this evening.  Developed a pressure across his chest with it.  Was found with EMS to be severely hypertensive and hypoxic.  Improved with CPAP and nitro.  The history is provided by the patient and the EMS personnel.  Illness Severity:  Severe Onset quality:  Sudden Duration:  2 hours Timing:  Constant Progression:  Partially resolved Chronicity:  New Associated symptoms: cough and shortness of breath   Associated symptoms: no abdominal pain, no chest pain, no congestion, no diarrhea, no fever, no headaches, no myalgias, no rash and no vomiting       Past Medical History:  Diagnosis Date   Anaphylactic shock, unspecified, sequela 06/10/2019   Arthritis    Asthma, chronic, unspecified asthma severity, with acute exacerbation 10/23/2017   CAP (community acquired pneumonia) 10/23/2017   Carpal tunnel syndrome of right wrist 10/05/2018   Cataract    right - removed by surgery   Cerebral infarction due to thrombosis of cerebral artery (HCC)    Cervical disc herniation 01/04/2020   Chronic diastolic heart failure ([place]) 04/13/2018   Chronic low back pain 11/24/2019   Constipation    Cough    chronic cough   Encounter for immunization 07/08/2017   ESRD on hemodialysis ([place]) 02/16/2018   Dialysis  T Thus Sat  - [facility]    Fall 06/04/2019   GERD (gastroesophageal reflux disease) 06/04/2019   GI bleeding 05/24/2017   Gram-negative sepsis, unspecified ([place]) 09/05/2017   History of fusion of  cervical spine 03/26/2020   Hyperlipidemia    Hypertension    Hypokalemia 06/04/2017   Iron deficiency anemia, unspecified 09/09/2017   Left shoulder pain 11/11/2019   Lumbar radiculopathy 11/24/2019   Lung contusion 06/04/2019   LVH (left ventricular hypertrophy) due to hypertensive disease, with heart failure ([place]) 06/18/2020   Macrocytic anemia 05/24/2017   Moderate protein-calorie malnutrition ([place]) 06/06/2017   Myofascial pain syndrome 06/12/2020   Neuritis of right ulnar nerve 09/13/2018   Non-compliance with renal dialysis ([place]) 02/08/2020   Oxygen deficiency 12/30/2019   O2 sats on RA 87% at PAT appt    Pain in joint of right elbow 09/13/2018   Pulmonary hypertension ([place])    Renovascular hypertension 06/22/2020   Restless leg syndrome    Rib fractures 06/2019   Right   S/P cardiac cath 08/27/2020   normal coronary arteries.   Secondary hyperparathyroidism of renal origin ([place]) 06/04/2017   Thoracic ascending aortic aneurysm (HCC)    4.5 cm 06/04/19 CT   Ulnar neuropathy 10/05/2018   Volume overload 10/23/2017    Patient Active Problem List   Diagnosis Date Noted   Preoperative cardiovascular examination 03/06/2021   CHF (congestive heart failure) ([place]) 02/17/2021   Cellulitis of arm, left 02/01/2021   Cellulitis of left lower limb 02/01/2021   Cellulitis, unspecified 02/01/2021   Left arm swelling 01/25/2021   Acute on chronic respiratory failure ([place]) 01/13/2021   Acute on chronic respiratory failure with  hypoxia ([place]) 01/12/2021   Concussion with no loss of consciousness 01/12/2021   Laceration without foreign body of left forearm, initial encounter 01/12/2021   Age-related physical debility 09/20/2020   Allergy, unspecified, initial encounter 09/20/2020   Nausea 09/20/2020   Pain, unspecified 09/20/2020   Pruritus, unspecified 09/20/2020   S/P cardiac cath 08/27/2020   Acute encephalopathy 08/26/2020   Peripheral neuropathy 08/26/2020   Elevated troponin 08/25/2020    Hyperkalemia 08/05/2020   Arthritis    Asthma    Cataract    Cerebral infarction due to thrombosis of cerebral artery (HCC)    History of CVA (cerebrovascular accident)    Chronic kidney disease    Dyspnea    Hyperlipidemia    Hypertension    Kidney failure    Restless leg syndrome    Degenerative lumbar spinal stenosis 45/40/9811   Diastolic dysfunction 91/47/8295   Renovascular hypertension 06/22/2020   Demand ischemia ([place]) 06/18/2020   LVH (left ventricular hypertrophy) due to hypertensive disease, with heart failure ([place]) 06/18/2020   Myofascial pain syndrome 06/12/2020   Encounter for removal of sutures 03/29/2020   History of fusion of cervical spine 03/26/2020   Non-compliance with renal dialysis ([place]) 02/08/2020   Acute pulmonary edema ([place]) 01/23/2020   Cervical disc herniation 01/04/2020   Tachycardia 01/04/2020   SOB (shortness of breath)    Hypoxia 12/30/2019   Chronic low back pain 11/24/2019   Degeneration of lumbar intervertebral disc 11/24/2019   Lumbar radiculopathy 11/24/2019   Left shoulder pain 11/11/2019   Anaphylactic shock, unspecified, sequela 06/10/2019   Rib fractures 06/05/2019   Fall at home, initial encounter 06/04/2019   Right rib fracture 06/04/2019   Lung contusion 06/04/2019   Acute respiratory failure with hypoxia ([place]) 06/04/2019   Anemia in ESRD (end-stage renal disease) ([place]) 06/04/2019   GERD without esophagitis 06/04/2019   Chronic diastolic (congestive) heart failure ([place]) 06/04/2019   Fall 06/04/2019   Thoracic ascending aortic aneurysm ([place]) 06/04/2019   Acute exacerbation of congestive heart failure ([place]) 05/16/2019   Carpal tunnel syndrome of right wrist 10/05/2018   Ulnar neuropathy 10/05/2018   Neuritis of right ulnar nerve 09/13/2018   Pain in joint of right elbow 09/13/2018   Chronic diastolic heart failure ([place]) 04/13/2018   Acute on chronic diastolic CHF (congestive heart failure) ([place]) 04/13/2018   Atypical chest pain  02/16/2018   ESRD on dialysis ([place]) 02/16/2018   ESRD (end stage renal disease) on dialysis ([place]) 02/16/2018   CAP (community acquired pneumonia) 10/23/2017   Asthma, chronic, unspecified asthma severity, with acute exacerbation 10/23/2017   Respiratory failure, acute ([place]) 10/23/2017   Pulmonary hypertension ([place]) 10/23/2017   Iron deficiency anemia, unspecified 09/09/2017   Gram-negative sepsis, unspecified ([place]) 09/05/2017   Encounter for immunization 07/08/2017   Moderate protein-calorie malnutrition ([place]) 06/06/2017   Coagulation defect, unspecified ([place]) 06/04/2017   Hypokalemia 06/04/2017   Other hyperlipidemia 06/04/2017   Secondary hyperparathyroidism of renal origin ([place]) 06/04/2017   Macrocytic anemia 05/24/2017   Uremia 05/24/2017   Acute kidney injury superimposed on CKD ([place]) 05/24/2017   CKD (chronic kidney disease), stage V ([place]) 05/24/2017   GI bleeding 05/24/2017   Anemia of chronic disease 05/24/2017   Stroke ([place]) 2018   AKI (acute kidney injury) ([place]) 04/10/2015   History of completed stroke    Essential hypertension 04/09/2015    Past Surgical History:  Procedure Laterality Date   A/V FISTULAGRAM N/A 01/01/2021   Procedure: A/V FISTULAGRAM;  Surgeon: Serafina Mitchell, MD;  Location: [facility];  Service: Cardiovascular;  Laterality: N/A;   A/V FISTULAGRAM N/A 01/15/2021   Procedure: A/V BPZWCHENIDP;  Surgeon: Serafina Mitchell, MD;  Location: [facility];  Service: Cardiovascular;  Laterality: N/A;   ANTERIOR CERVICAL DECOMP/DISCECTOMY FUSION N/A 01/04/2020   Procedure: ANTERIOR CERVICAL DECOMPRESSION/DISCECTOMY FUSION CERVICAL FIVE THROUGH SEVEN;  Surgeon: Melina Schools, MD;  Location: [facility];  Service: Orthopedics;  Laterality: N/A;  3 hrs   AV FISTULA PLACEMENT Left 05/28/2017   Procedure: LEFT ARM ARTERIOVENOUS (AV) FISTULA CREATION;  Surgeon: Conrad Slayton, MD;  Location: [facility];  Service: Vascular;  Laterality: Left;   AV FISTULA PLACEMENT Left  02/02/2021   Procedure: Debride left forearm, ligation of left upper arm Aretiovenous fistula;  Surgeon: Elam Dutch, MD;  Location: [facility] OR;  Service: Vascular;  Laterality: Left;   EYE SURGERY Right 06/02/2019   Cataract removed   FRACTURE SURGERY     INSERTION OF DIALYSIS CATHETER Right 02/02/2021   Procedure: INSERTION OF Right Internal jugular TUNNELED  DIALYSIS CATHETER;  Surgeon: Elam Dutch, MD;  Location: [facility];  Service: Vascular;  Laterality: Right;   IR DIALY SHUNT INTRO NEEDLE/INTRACATH INITIAL W/IMG LEFT Left 09/12/2020   IR FLUORO GUIDE CV LINE RIGHT  05/25/2017   IR US GUIDE VASC ACCESS LEFT  09/12/2020   IR US GUIDE VASC ACCESS RIGHT  05/25/2017   LIGATION OF ARTERIOVENOUS  FISTULA Left 01/10/2021   Procedure: LIGATION OF LEFT ARM RADIOCEPHALIC FISTULA;  Surgeon: Serafina Mitchell, MD;  Location: [facility];  Service: Vascular;  Laterality: Left;   PERIPHERAL VASCULAR BALLOON ANGIOPLASTY Left 01/15/2021   Procedure: PERIPHERAL VASCULAR BALLOON ANGIOPLASTY;  Surgeon: Serafina Mitchell, MD;  Location: [facility];  Service: Cardiovascular;  Laterality: Left;  AVF   REVISON OF ARTERIOVENOUS FISTULA Left 07/18/2019   Procedure: REVISION PLICATION OF RADIOCEPHALIC ARTERIOVENOUS FISTULA LEFT ARM;  Surgeon: Angelia Mould, MD;  Location: [facility] OR;  Service: Vascular;  Laterality: Left;   REVISON OF ARTERIOVENOUS FISTULA Left 01/10/2021   Procedure: CONVERSION TO BRACHIOCEPHALIC ARTERIOVENOUS FISTULA;  Surgeon: Serafina Mitchell, MD;  Location: [facility] OR;  Service: Vascular;  Laterality: Left;   RINOPLASTY         Family History  Problem Relation Age of Onset   Cancer Mother     Social History   Tobacco Use   Smoking status: Never   Smokeless tobacco: Never  Vaping Use   Vaping Use: Never used  Substance Use Topics   Alcohol use: Not Currently    Alcohol/week: 0.0 standard drinks    Comment: has a drink once in a while- 12/30/19   Drug use: No    Home  Medications Prior to Admission medications   Medication Sig Start Date End Date Taking? Authorizing Provider  acetaminophen (TYLENOL) 500 MG tablet Take 500 mg by mouth every 6 (six) hours as needed for moderate pain or headache.    [provider]  albuterol (VENTOLIN HFA) 108 (90 Base) MCG/ACT inhaler Inhale 2 puffs into the lungs in the morning and at bedtime.    [provider]  amLODipine (NORVASC) 10 MG tablet Take 10 mg by mouth daily.     [provider]  BREO ELLIPTA 100-25 MCG/INH AEPB Inhale 1 puff into the lungs 2 (two) times daily as needed (sob/wheezing). 06/26/20   [provider]  cholecalciferol (VITAMIN D3) 25 MCG (1000 UNIT) tablet Take 1,000 Units by mouth once a week.  [provider]  DIPHENHYDRAMINE HCL PO Take 25 mg by mouth as needed (allergies). 09/24/20 09/23/21  [provider]  doxercalciferol (HECTOROL) 0.5 MCG capsule Take 0.5 mcg by mouth 3 (three) times a week. 11/26/20 11/25/21  [provider]  furosemide (LASIX) 80 MG tablet Take 80 mg by mouth as directed. Take Daily PRN On Non-Dialysis Days (Sun, Tues, Thurs, Sat)    [provider]  hydrALAZINE (APRESOLINE) 25 MG tablet Take 50 mg by mouth in the morning and at bedtime.    [provider]  metoprolol tartrate (LOPRESSOR) 100 MG tablet Take 0.5 tablets (50 mg total) by mouth 2 (two) times daily. Patient taking differently: Take 100 mg by mouth daily. 01/15/21   Alma Friendly, MD  multivitamin (RENA-VIT) TABS tablet Take 1 tablet by mouth daily. 01/12/18   [provider]  pregabalin (LYRICA) 25 MG capsule Take 25 mg by mouth daily as needed (pain). 11/29/20   [provider]  sevelamer carbonate (RENVELA) 800 MG tablet Take 800-1,600 mg by mouth daily as needed (with snacks or meals). With Snacks & Meals 11/07/19   [provider]  traMADol (ULTRAM) 50 MG tablet Take 1 tablet (50 mg total) by mouth every 6  (six) hours as needed for moderate pain. 01/25/21   Ulyses Amor, PA-C    Allergies    Patient has no known allergies.  Review of Systems   Review of Systems  Constitutional:  Negative for chills and fever.  HENT:  Negative for congestion and facial swelling.   Eyes:  Negative for discharge and visual disturbance.  Respiratory:  Positive for cough, chest tightness and shortness of breath.   Cardiovascular:  Positive for leg swelling (chronic and unchanged). Negative for chest pain and palpitations.  Gastrointestinal:  Negative for abdominal pain, diarrhea and vomiting.  Musculoskeletal:  Negative for arthralgias and myalgias.  Skin:  Negative for color change and rash.  Neurological:  Negative for tremors, syncope and headaches.  Psychiatric/Behavioral:  Negative for confusion and dysphoric mood.    Physical Exam Updated Vital Signs BP (!) 145/99 (BP Location: Right Arm)   Pulse 89   Temp 98.2 F (36.8 C) (Oral)   Resp (!) 22   Ht 5\' 8"  (1.727 m)   Wt 56.7 kg   SpO2 96%   BMI 19.01 kg/m   Physical Exam Vitals and nursing note reviewed.  Constitutional:      Appearance: He is well-developed.  HENT:     Head: Normocephalic and atraumatic.  Eyes:     Pupils: Pupils are equal, round, and reactive to light.  Neck:     Vascular: JVD (to the angle of the jaw) present.  Cardiovascular:     Rate and Rhythm: Normal rate and regular rhythm.     Heart sounds: No murmur heard.   No friction rub. No gallop.  Pulmonary:     Effort: No respiratory distress.     Breath sounds: Rales (L>R up to the scapula) present. No wheezing.  Abdominal:     General: There is no distension.     Tenderness: There is no abdominal tenderness. There is no guarding or rebound.  Musculoskeletal:        General: Normal range of motion.     Cervical back: Normal range of motion and neck supple.     Right lower leg: Edema present.     Left lower leg: Edema present.  Skin:    Coloration: Skin is  not  pale.     Findings: No rash.  Neurological:     Mental Status: He is alert and oriented to person, place, and time.  Psychiatric:        Behavior: Behavior normal.    ED Results / Procedures / Treatments   Labs (all labs ordered are listed, but only abnormal results are displayed) Labs Reviewed  CBC WITH DIFFERENTIAL/PLATELET  BASIC METABOLIC PANEL  BRAIN NATRIURETIC PEPTIDE  I-STAT CHEM 8, ED  TROPONIN I (HIGH SENSITIVITY)    EKG EKG Interpretation  Date/Time:  Friday March 29 2021 01:59:19 EDT Ventricular Rate:  87 PR Interval:  201 QRS Duration: 82 QT Interval:  382 QTC Calculation: 460 R Axis:   87 Text Interpretation: Sinus rhythm Probable left atrial enlargement Borderline right axis deviation Probable LVH with secondary repol abnrm Anterior Q waves, possibly due to LVH No significant change since last tracing Confirmed by Deno Etienne [phone]) on 03/29/2021 2:07:45 AM  Radiology No results found.  Procedures Procedures   Medications Ordered in ED Medications - No data to display  ED Course  I have reviewed the triage vital signs and the nursing notes.  Pertinent labs & imaging results that were available during my care of the patient were reviewed by me and considered in my medical decision making (see chart for details).    MDM Rules/Calculators/A&P                           60 yo M with a chief complaint of shortness of breath.  Started abruptly and was likely in acute pulmonary edema with initial evaluation by EMS.  Improved significantly with nitro and BiPAP.  Patient not normally on oxygen now on 5 L.  Was in the 33s with EMS arrival.  We will obtain a laboratory evaluation chest x-ray likely will need urgent dialysis.  K 5.3.  CXR consisted with fluid overload.   I discussed with nephrology, Dr. Osborne Casco we will urgently dialyzed this morning.  Plan for likely dialysis at home as long as the patient has significant improvement of his  breathing.  CRITICAL CARE Performed by: Cecilio Asper   Total critical care time: 35 minutes  Critical care time was exclusive of separately billable procedures and treating other patients.  Critical care was necessary to treat or prevent imminent or life-threatening deterioration.  Critical care was time spent personally by me on the following activities: development of treatment plan with patient and/or surrogate as well as nursing, discussions with consultants, evaluation of patient's response to treatment, examination of patient, obtaining history from patient or surrogate, ordering and performing treatments and interventions, ordering and review of laboratory studies, ordering and review of radiographic studies, pulse oximetry and re-evaluation of patient's condition.  The patients results and plan were reviewed and discussed.   Any x-rays performed were independently reviewed by myself.   Differential diagnosis were considered with the presenting HPI.  Medications  Chlorhexidine Gluconate Cloth 2 % PADS 6 each (has no administration in time range)    Vitals:   03/29/21 0206 03/29/21 0315 03/29/21 0345 03/29/21 0415  BP:  (!) 182/84 (!) 161/85 (!) 160/84  Pulse:  76 73 74  Resp:  19 16 18   Temp:      TempSrc:      SpO2:  100% 98% 100%  Weight: 56.7 kg     Height: 5\' 8"  (1.727 m)       Final diagnoses:  Acute  pulmonary edema [facility])     Final Clinical Impression(s) / ED Diagnoses Final diagnoses:  None    Rx / DC Orders ED Discharge Orders     None        Deno Etienne, DO 03/29/21 [phone]

## 2021-03-29 NOTE — ED Provider Notes (Signed)
  Physical Exam  BP (!) 159/74 (BP Location: Right Arm)   Pulse 71   Temp 98 F (36.7 C) (Oral)   Resp 18   Ht 5\' 8"  (1.727 m)   Wt 56.7 kg   SpO2 96%   BMI 19.01 kg/m   Physical Exam  ED Course/Procedures     Procedures  MDM  Patient came back from dialysis.  Patient initially required 5 L nasal cannula.  After dialysis he was on 3 L.  He did not finish full dialysis because he states that his back is hurting.  Patient denies any shortness of breath.  His oxygen saturation is around 89 to 90% on room air.  He really wants to go home.  He does not have any oxygen at home.  I told him that he needs to go to dialysis as scheduled.  He states that he will return to the ER if he has worsening shortness of breath      Drenda Freeze, MD 03/29/21 [phone]

## 2021-03-29 NOTE — ED Notes (Signed)
Patient verbalizes understanding of discharge instructions. Opportunity for questioning and answers were provided. Armband removed by staff, pt discharged from ED to home with friend. Alert and oriented x4 at DC

## 2021-03-29 NOTE — ED Notes (Signed)
RT at bedside.

## 2021-03-29 NOTE — ED Triage Notes (Signed)
Brought via [facility] from home for SOB. Missed dialysis yesterday. Dialysis Mon, Wed, Fri. Dialysis fistula to rt chest. Room air sats 70 % rales note through out. Placed on CPAP by EMS. Edema lower bilat lower ext. Abd distended PIV placed LT AC. Nitro x 3.

## 2021-03-29 NOTE — ED Notes (Signed)
Pt transported to HD suite (bay 8) for treatment.

## 2021-03-29 NOTE — ED Provider Notes (Signed)
Patient initially seen by Dr. Tyrone Nine.  Please see his note.  Patient missed dialysis.  Plan is for dialysis today for his pulmonary edema.  Depending on his response to treatment after dialysis he may end up getting discharged   Dorie Rank, MD 03/29/21 [phone]

## 2021-03-29 NOTE — ED Notes (Signed)
Provided scrub bottoms prior to dc

## 2021-03-29 NOTE — ED Notes (Signed)
Provider at bedside

## 2021-03-29 NOTE — Progress Notes (Signed)
Brief nephrology note  60 year old male who dialyzes at [facility] presents after missing dialysis on Wednesday now with significant hypertension and volume overload.  Dialysis is ordered.  He will need a negative COVID test before he can be brought up to the dialysis unit.  If he significantly improves after dialysis he is okay to be discharged from the ER.  Otherwise may require admission.

## 2021-03-30 IMAGING — CR DG CHEST 2V
2 series · 2 of 2 positions shown · non-contrast
Comparison: 07/16/2020

CLINICAL DATA: Generalize weakness, shortness of breath, and
nausea.

EXAM:
CHEST - 2 VIEW

[chest lat]
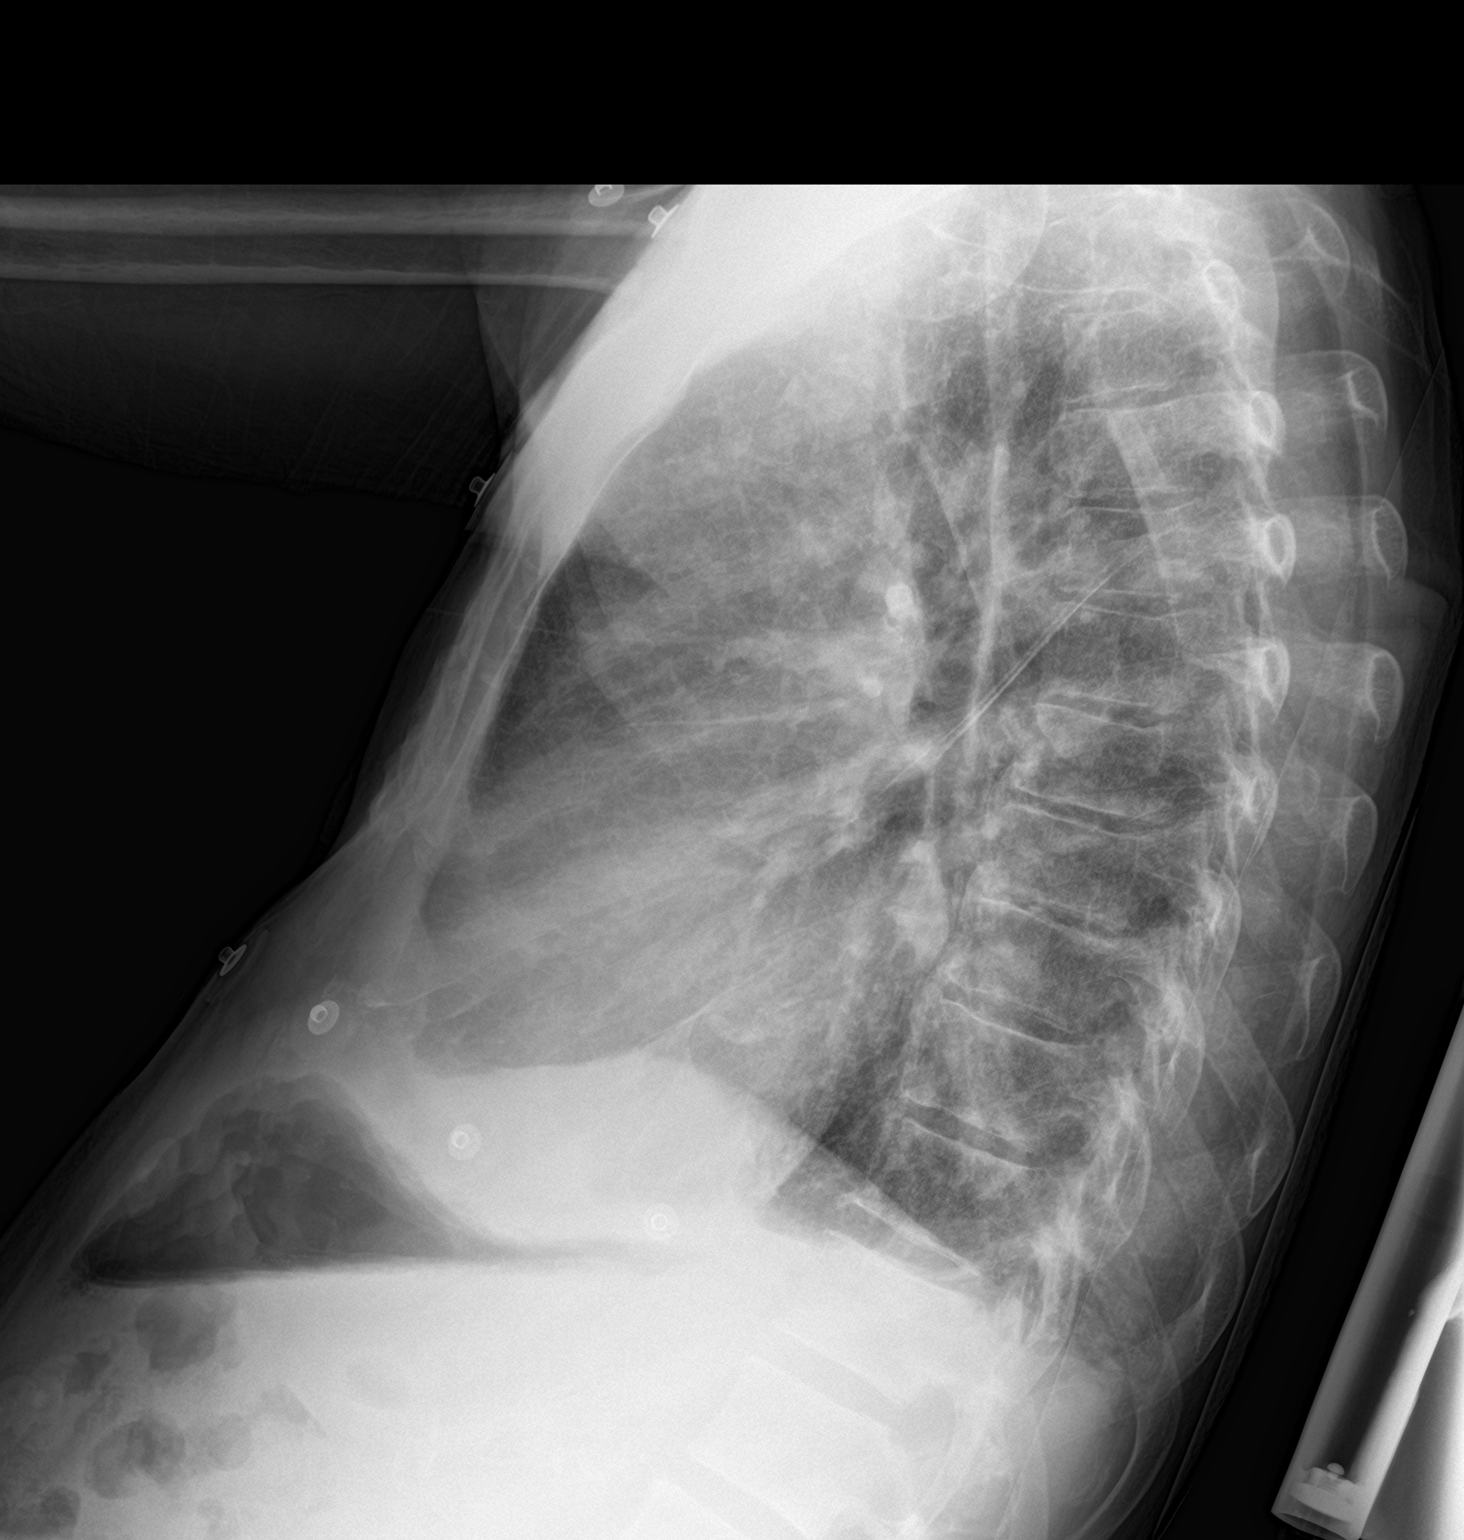

[chest ap]
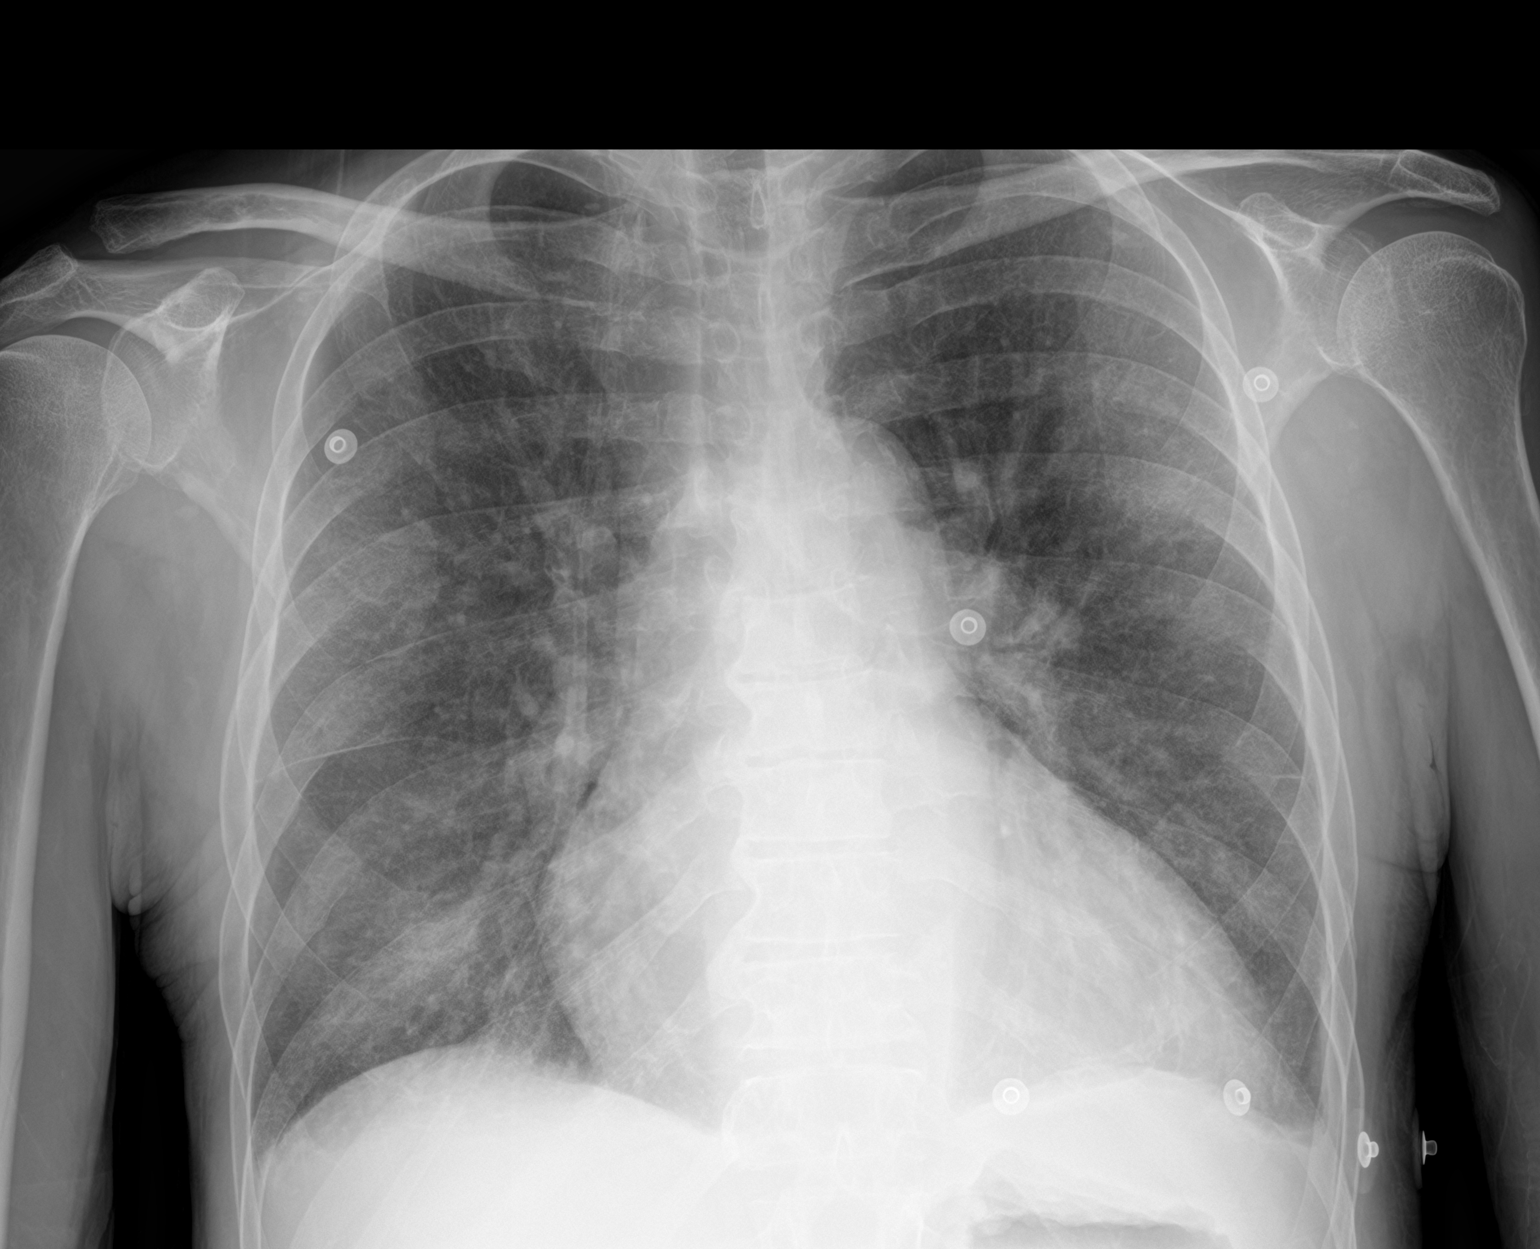

[2 of 2 positions shown; findings below may reference images not displayed]

FINDINGS: Cardiac enlargement. Bilateral patchy airspace disease in the lungs.
Changes may represent edema or pneumonia. Small left pleural
effusion. No pneumothorax. Mediastinal contours appear intact.
Postoperative changes in the cervical spine.
IMPRESSION: Cardiac enlargement with bilateral patchy airspace disease and small
left pleural effusion.

## 2021-04-03 ENCOUNTER — Encounter (HOSPITAL_COMMUNITY): Payer: Self-pay | Admitting: Surgery

## 2021-04-03 NOTE — Anesthesia Preprocedure Evaluation (Addendum)
Anesthesia Evaluation  Patient identified by MRN, date of birth, ID band Patient awake    Reviewed: Allergy & Precautions, H&P , NPO status , Patient's Chart, lab work & pertinent test results  Airway Mallampati: II  TM Distance: >3 FB Neck ROM: Full    Dental no notable dental hx. (+) Edentulous Lower, Poor Dentition, Dental Advisory Given   Pulmonary shortness of breath, with exertion and Long-Term Oxygen Therapy, asthma ,  O2 sats on RA 87% at PAT appt    Pulmonary exam normal breath sounds clear to auscultation       Cardiovascular Exercise Tolerance: Good hypertension, Pt. on medications +CHF  Normal cardiovascular exam Rhythm:Regular Rate:Normal  Echo 01/13/21: IMPRESSIONS  1. Left ventricular ejection fraction, by estimation, is 60 to 65%. The  left ventricle has normal function. The left ventricle has no regional  wall motion abnormalities. There is moderate concentric left ventricular  hypertrophy. Left ventricular  diastolic parameters are consistent with Grade I diastolic dysfunction  (impaired relaxation).  2. Right ventricular systolic function is mildly reduced. The right  ventricular size is mildly enlarged. There is severely elevated pulmonary  artery systolic pressure. The estimated right ventricular systolic  pressure is 59.4 mmHg.  3. There is a trivial pericardial effusion posterior to the left  ventricle.  4. The mitral valve is abnormal. Mild mitral valve regurgitation.  Moderate mitral annular calcification.  5. Tricuspid valve regurgitation is moderate.  6. The aortic valve is tricuspid. There is moderate calcification of the  aortic valve. Aortic valve regurgitation is mild. Mild to moderate aortic  valve stenosis. Aortic valve mean gradient measures 12.0 mmHg.  Dimentionless index 0.58.  7. Aortic dilatation noted. There is mild dilatation of the ascending  aorta, measuring 41 mm.  8. The  inferior vena cava is dilated in size with <50% respiratory  variability, suggesting right atrial pressure of 15 mmHg.    Cardiac cath 08/27/20 [facility]):  LM: Normal  LAD: Normal  Lcx: Normal  RCA: Normal Impression:  Normal coronary arteries  Normal left ventricular end-diastolic pressure    Coronary CT 06/24/18 IMPRESSION: 1. Coronary calcium score of 39. This was 75 percentile for age and sex matched control. 2. Normal coronary origin with right dominance. 3. Mild non-obstructive CAD. 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 4. Aneurysmal dilatation of the ascending aorta with maximum diameter 43 mm, no calcifications and no dissection. 5. Dilated pulmonary artery measuring 35 mm suggestive of pulmonary Hypertension.     Neuro/Psych  Neuromuscular disease negative psych ROS   GI/Hepatic GERD  ,  Endo/Other  negative endocrine ROS  Renal/GU Dialysis and ESRFRenal disease  negative genitourinary   Musculoskeletal  (+) Arthritis , Osteoarthritis,    Abdominal   Peds negative pediatric ROS (+)  Hematology  (+) Blood dyscrasia, anemia ,   Anesthesia Other Findings   Reproductive/Obstetrics negative OB ROS                           Anesthesia Physical Anesthesia Plan  ASA: 4  Anesthesia Plan: Regional and MAC   Post-op Pain Management:    Induction: Intravenous  PONV Risk Score and Plan: Ondansetron and Treatment may vary due to age or medical condition  Airway Management Planned: Nasal Cannula, Natural Airway and Simple Face Mask  Additional Equipment: None  Intra-op Plan:   Post-operative Plan:   Informed Consent: I have reviewed the patients History and Physical,  chart, labs and discussed the procedure including the risks, benefits and alternatives for the proposed anesthesia with the patient or authorized representative who has indicated his/her understanding and acceptance.       Plan  Discussed with: Anesthesiologist and CRNA  Anesthesia Plan Comments: (PAT note written 04/03/2021 by Myra Gianotti, PA-C. DISCUSSION: Patient is a 60 year old male scheduled for the above procedure.He is new left brachiocephalic AVF 11/19/20, venoplasty left SCV 01/15/21, but required ligation of AVF for LUE swelling and cellulitis. S/p right IJ Palindrome catheter 02/02/21 and now needs new permanent hemodialysis access.  History includes never smoker, ESRD (ligation left radiocephalic AVF, creation left brachiocephalic AVF 4/82/50; venoplasty left SCV 01/15/21; I&D left forearm, ligation of LUE AVF, insertion right IJ Palindrome catheter 02/02/21),HFpEF, CAD (mild, nonobstructive by coronary CT 2019with normal coronaries by 08/27/20 [facility]),aortic stenosis (mild-moderate AS 12/2020 echo), pulmonary hypertension (severe by 08/2020 & 12/2020 echo),thoracic aortic aneurysm (4.5 cm by CT9/22/21), HTN, HLD, GERD, anemia, GI bleed (2018), CVA, asthma, neck surgery (C5-7 ACDF 01/04/20). Notes indicate he had a history of medical and hemodialysis non-compliance. He has intermittently required home oxygen, with oxygen needs variable depending on his volume status and hemodialysis and medication compliance.  Multiple admission and/or ED evaluation for acute respiratory distress due to volume overload in the setting of missed hemodialysis. Last ED evaluation 03/29/21. Required 5L O2/[place]. Treated with hemodialysis, but he stopped the treatment early due to back pain. Reported SOB resolved. RA O2 sat 89-90%. He did not want to be admitted and said he would comply with HD scheduled and return to ED if worsening symptoms. By notes, his current HD schedule is MWF. His last admission for 02/18/21-02/18/21 again for acute respiratory distress in setting of missed HD, requiring BiPAP. He signed out AMA after undergoing a dialysis session.  His last cardiology evaluation with Dr. Sherren Mocha was on 03/06/21 for follow-up and preoperative  evaluation for back surgery. See below (PROVIDERS). )       Anesthesia Quick Evaluation

## 2021-04-03 NOTE — Progress Notes (Addendum)
Anesthesia Chart Review: SAME DAY WORK-UP   Case: [id number] Date/Time: 04/04/21 0715   Procedure: ARTERIOVENOUS (AV) FISTULA CREATION RIGHT VERSUS GRAFT (Right)   Anesthesia type: Choice   Pre-op diagnosis: ESRD   Location: [name] / [place] OR   Surgeons: Serafina Mitchell, MD       DISCUSSION: Patient is a 60 year old male scheduled for the above procedure. He is new left brachiocephalic AVF 02/21/28, venoplasty left SCV 01/15/21, but required ligation of AVF for LUE swelling and cellulitis. S/p right IJ Palindrome catheter 02/02/21 and now needs new permanent hemodialysis access.  History includes never smoker, ESRD (ligation left radiocephalic AVF, creation left brachiocephalic AVF 7/98/92; venoplasty left SCV 01/15/21; I&D left forearm, ligation of LUE AVF, insertion right IJ Palindrome catheter 02/02/21), HFpEF, CAD (mild, nonobstructive by coronary CT 2019 with normal coronaries by 08/27/20 [facility]), aortic stenosis (mild-moderate AS 12/2020 echo), pulmonary hypertension (severe by 08/2020 & 12/2020 echo), thoracic aortic aneurysm (4.5 cm by CT 05/23/20), HTN, HLD, GERD, anemia, GI bleed (2018), CVA, asthma, neck surgery (C5-7 ACDF 01/04/20). Notes indicate he had a history of medical and hemodialysis non-compliance. He has intermittently required home oxygen, with oxygen needs variable depending on his volume status and hemodialysis and medication compliance.    Multiple admission and/or ED evaluation for acute respiratory distress due to volume overload in the setting of missed hemodialysis. Last ED evaluation 03/29/21. Required 5L O2/[place]. Treated with hemodialysis, but he stopped the treatment early due to back pain. Reported SOB resolved. RA O2 sat 89-90%. He did not want to be admitted and said he would comply with HD scheduled and return to ED if worsening symptoms. By notes, his current HD schedule is MWF. His last admission for 02/18/21-02/18/21 again for acute respiratory distress in setting of missed HD,  requiring BiPAP. He signed out AMA after undergoing a dialysis session.  His last cardiology evaluation with Dr. Sherren Mocha was on 03/06/21 for follow-up and preoperative evaluation for back surgery. See below (PROVIDERS).   He is a same-day work-up.  Anesthesia team to evaluate on the day of surgery.   VS:  BP Readings from Last 3 Encounters:  03/29/21 (!) 159/74  03/25/21 (!) 185/87  03/06/21 128/60   Pulse Readings from Last 3 Encounters:  03/29/21 71  03/25/21 85  03/06/21 70     PROVIDERS: [name], PA is PCP ([place]). Last visit 01/25/21.   [name], DO is cardiologist. Last evaluation 03/06/21 for cardiology follow-up and preoperative evaluation for back surgery with Rennis Harding, MD. She wrote, "He appears to be doing well from a cardiovascular standpoint.  His limitations are mostly based on his disabilities with his back.  He is planning upcoming back surgery, the patient does not have any unstable cardiac conditions.  Upon evaluation today, he can achieve 4 METs or greater without anginal symptoms.  According to [facility] and AHA guidelines, he requires no further cardiac workup prior to his noncardiac surgery and should be at acceptable risk.     He is back on track with his dialysis he is now at the [facility] and he is getting his dialysis regularly.   Blood pressure is acceptable, continue with current antihypertensive regimen."     LABS: For ISTAT on arrival. Last lab results include:    Spirometry 06/26/20 ([name]): Impression: There is a severe obstructive ventilatory defect.  The total lung capacity is mildly reduced. Air trapping is not seen.  The diffusing  capacity is mildly reduced.  There is a significant response to bronchodilators.   IMAGES: 1V PCXR 03/29/21:  FINDINGS: There is stable right internal jugular venous catheter positioning. The lungs are hyperinflated with diffuse, chronic appearing increased interstitial lung  markings. Mild areas of bilateral infrahilar atelectasis and/or infiltrate are seen. Very small bilateral pleural effusions are noted. No pneumothorax is identified. The cardiac silhouette is mildly enlarged and unchanged in size. A radiopaque fusion plate and screws are seen overlying the lower cervical spine. IMPRESSION: Stable cardiomegaly with mild bilateral infrahilar atelectasis and/or infiltrate.     EKG: 03/29/21: Sinus rhythm Probable left atrial enlargement Borderline right axis deviation Probable LVH with secondary repol abnrm Anterior Q waves, possibly due to LVH No significant change since last tracing Confirmed by Deno Etienne [phone]) on 03/29/2021 2:07:45    CV: Echo 01/13/21: IMPRESSIONS   1. Left ventricular ejection fraction, by estimation, is 60 to 65%. The  left ventricle has normal function. The left ventricle has no regional  wall motion abnormalities. There is moderate concentric left ventricular  hypertrophy. Left ventricular  diastolic parameters are consistent with Grade I diastolic dysfunction  (impaired relaxation).   2. Right ventricular systolic function is mildly reduced. The right  ventricular size is mildly enlarged. There is severely elevated pulmonary  artery systolic pressure. The estimated right ventricular systolic  pressure is 93.8 mmHg.   3. There is a trivial pericardial effusion posterior to the left  ventricle.   4. The mitral valve is abnormal. Mild mitral valve regurgitation.  Moderate mitral annular calcification.   5. Tricuspid valve regurgitation is moderate.   6. The aortic valve is tricuspid. There is moderate calcification of the  aortic valve. Aortic valve regurgitation is mild. Mild to moderate aortic  valve stenosis. Aortic valve mean gradient measures 12.0 mmHg.  Dimentionless index 0.58.   7. Aortic dilatation noted. There is mild dilatation of the ascending  aorta, measuring 41 mm.   8. The inferior vena cava is dilated  in size with <50% respiratory  variability, suggesting right atrial pressure of 15 mmHg.  (Comparison echo 08/28/20 @ [place]: LVEF 65-70%, moderate concentric LVH, no regional wall motion abnormalities, prolonged relaxation, RV moderately dilated, normal RVSF, LA/RA mildly dilated, mild AS, mild AR/TR, severe pulmonary hypertension, estimated RVSP 70-80 mmHg, ascending aorta 4.1 cm, trivial pericardial effusion; Comparison echo 05/17/19: LVEF 65-70%, severe LVH (consider work-up for infiltrative cardiomyopathy such as amyloidosis), normal wall motion, grade 1 DD, elevated LV filling pressure, normal RV function, severe LAE, aortic valve sclerosis with moderate AI, trivial MR, mild TR, RVSP 54 mmHg, dilated IVC that collapses, trivial pericardial effusion)   Cardiac cath 08/27/20 [facility]): This demonstrated the following findings:  LM: Normal  LAD: Normal  Lcx: Normal  RCA: Normal Impression:  Normal coronary arteries  Normal left ventricular end-diastolic pressure  Plan:  1. Secondary prevention of coronary artery disease  2.  Medical management      Coronary CT 06/24/18 IMPRESSION: 1. Coronary calcium score of 39. This was 70 percentile for age and sex matched control. 2. Normal coronary origin with right dominance. 3. Mild non-obstructive CAD. 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 4. Aneurysmal dilatation of the ascending aorta with maximum diameter 43 mm, no calcifications and no dissection. 5. Dilated pulmonary artery measuring 35 mm suggestive of pulmonary Hypertension.     Carotid US 04/10/15: Summary:  Mild intimal thickening with no significant extracranial carotid  artery stenosis demonstrated. Vertebrals are  patent with antegrade  flow.  Past Medical History:  Diagnosis Date   Anaphylactic shock, unspecified, sequela 06/10/2019   Aortic stenosis    mild-moderate AS 12/2020 echo   Arthritis    Asthma, chronic, unspecified asthma severity,  with acute exacerbation 10/23/2017   CAP (community acquired pneumonia) 10/23/2017   Carpal tunnel syndrome of right wrist 10/05/2018   Cataract    right - removed by surgery   Cerebral infarction due to thrombosis of cerebral artery (HCC)    Cervical disc herniation 01/04/2020   Chronic diastolic heart failure ([place]) 04/13/2018   Chronic low back pain 11/24/2019   Constipation    Cough    chronic cough   Encounter for immunization 07/08/2017   ESRD on hemodialysis ([place]) 02/16/2018   Fall 06/04/2019   GERD (gastroesophageal reflux disease) 06/04/2019   GI bleeding 05/24/2017   Gram-negative sepsis, unspecified ([place]) 09/05/2017   History of fusion of cervical spine 03/26/2020   Hyperlipidemia    Hypertension    Hypokalemia 06/04/2017   Iron deficiency anemia, unspecified 09/09/2017   Left shoulder pain 11/11/2019   Lumbar radiculopathy 11/24/2019   Lung contusion 06/04/2019   LVH (left ventricular hypertrophy) due to hypertensive disease, with heart failure ([place]) 06/18/2020   Macrocytic anemia 05/24/2017   Moderate protein-calorie malnutrition ([place]) 06/06/2017   Myofascial pain syndrome 06/12/2020   Neuritis of right ulnar nerve 09/13/2018   Non-compliance with renal dialysis ([place]) 02/08/2020   Oxygen deficiency 12/30/2019   O2 sats on RA 87% at PAT appt    Pain in joint of right elbow 09/13/2018   Pulmonary hypertension ([place])    Renovascular hypertension 06/22/2020   Restless leg syndrome    Rib fractures 06/2019   Right   S/P cardiac cath 08/27/2020   normal coronary arteries.   Secondary hyperparathyroidism of renal origin ([place]) 06/04/2017   Thoracic ascending aortic aneurysm (HCC)    4.5 cm 06/04/19 CT   Ulnar neuropathy 10/05/2018   Volume overload 10/23/2017    Past Surgical History:  Procedure Laterality Date   A/V FISTULAGRAM N/A 01/01/2021   Procedure: A/V FISTULAGRAM;  Surgeon: Serafina Mitchell, MD;  Location: [place] [facility];  Service: Cardiovascular;   Laterality: N/A;   A/V FISTULAGRAM N/A 01/15/2021   Procedure: A/V [id number];  Surgeon: Serafina Mitchell, MD;  Location: [place] [facility];  Service: Cardiovascular;  Laterality: N/A;   ANTERIOR CERVICAL DECOMP/DISCECTOMY FUSION N/A 01/04/2020   Procedure: ANTERIOR CERVICAL DECOMPRESSION/DISCECTOMY FUSION CERVICAL FIVE THROUGH SEVEN;  Surgeon: Melina Schools, MD;  Location: [place];  Service: Orthopedics;  Laterality: N/A;  3 hrs   AV FISTULA PLACEMENT Left 05/28/2017   Procedure: LEFT ARM ARTERIOVENOUS (AV) FISTULA CREATION;  Surgeon: Conrad Foosland, MD;  Location: [place];  Service: Vascular;  Laterality: Left;   AV FISTULA PLACEMENT Left 02/02/2021   Procedure: Debride left forearm, ligation of left upper arm Aretiovenous fistula;  Surgeon: Elam Dutch, MD;  Location: [facility] OR;  Service: Vascular;  Laterality: Left;   EYE SURGERY Right 06/02/2019   Cataract removed   FRACTURE SURGERY     INSERTION OF DIALYSIS CATHETER Right 02/02/2021   Procedure: INSERTION OF Right Internal jugular TUNNELED  DIALYSIS CATHETER;  Surgeon: Elam Dutch, MD;  Location: [place];  Service: Vascular;  Laterality: Right;   IR DIALY SHUNT INTRO NEEDLE/INTRACATH INITIAL W/IMG LEFT Left 09/12/2020   IR FLUORO GUIDE CV LINE RIGHT  05/25/2017   IR US GUIDE VASC ACCESS LEFT  09/12/2020  IR US GUIDE VASC ACCESS RIGHT  05/25/2017   LIGATION OF ARTERIOVENOUS  FISTULA Left 01/10/2021   Procedure: LIGATION OF LEFT ARM RADIOCEPHALIC FISTULA;  Surgeon: Serafina Mitchell, MD;  Location: [place];  Service: Vascular;  Laterality: Left;   PERIPHERAL VASCULAR BALLOON ANGIOPLASTY Left 01/15/2021   Procedure: PERIPHERAL VASCULAR BALLOON ANGIOPLASTY;  Surgeon: Serafina Mitchell, MD;  Location: [place] [facility];  Service: Cardiovascular;  Laterality: Left;  AVF   REVISON OF ARTERIOVENOUS FISTULA Left 07/18/2019   Procedure: REVISION PLICATION OF RADIOCEPHALIC ARTERIOVENOUS FISTULA LEFT ARM;  Surgeon: Angelia Mould, MD;  Location: [place];   Service: Vascular;  Laterality: Left;   REVISON OF ARTERIOVENOUS FISTULA Left 01/10/2021   Procedure: CONVERSION TO BRACHIOCEPHALIC ARTERIOVENOUS FISTULA;  Surgeon: Serafina Mitchell, MD;  Location: [facility];  Service: Vascular;  Laterality: Left;   RINOPLASTY      MEDICATIONS: No current facility-administered medications for this encounter.    acetaminophen (TYLENOL) 500 MG tablet   albuterol (VENTOLIN HFA) 108 (90 Base) MCG/ACT inhaler   amLODipine (NORVASC) 10 MG tablet   AURYXIA 1 GM 210 MG(Fe) tablet   BREO ELLIPTA 100-25 MCG/INH AEPB   cholecalciferol (VITAMIN D3) 25 MCG (1000 UNIT) tablet   cinacalcet (SENSIPAR) 60 MG tablet   DIPHENHYDRAMINE HCL PO   doxercalciferol (HECTOROL) 0.5 MCG capsule   furosemide (LASIX) 80 MG tablet   hydrALAZINE (APRESOLINE) 25 MG tablet   metoprolol tartrate (LOPRESSOR) 100 MG tablet   multivitamin (RENA-VIT) TABS tablet   pregabalin (LYRICA) 25 MG capsule   sevelamer carbonate (RENVELA) 800 MG tablet   traMADol (ULTRAM) 50 MG tablet    Myra Gianotti, PA-C Surgical Short Stay/Anesthesiology [facility] Phone [phone] [facility] Phone [phone] 04/03/2021 10:28 AM

## 2021-04-03 NOTE — Progress Notes (Signed)
PCP - [name], PA-C Cardiologist - [name], DO  Chest x-ray - 03/29/21 (1V) EKG - 03/29/21 Stress Test - n/a ECHO - 01/13/21 Cardiac Cath - 08/27/20  Sleep Study -  n/a CPAP - none  Anesthesia review: Yes  STOP now taking any Aspirin (unless otherwise instructed by your surgeon), Aleve, Naproxen, Ibuprofen, Motrin, Advil, Goody's, BC's, all herbal medications, fish oil, and all vitamins.   Coronavirus Screening Covid test n/a - Ambulatory Surgery

## 2021-04-04 ENCOUNTER — Other Ambulatory Visit: Payer: Self-pay

## 2021-04-04 ENCOUNTER — Encounter (HOSPITAL_COMMUNITY)
Admission: RE | Disposition: A | Discharge status: Home / Self Care | Payer: Self-pay | Source: Home / Self Care | Attending: Surgery

## 2021-04-04 ENCOUNTER — Ambulatory Visit (HOSPITAL_COMMUNITY): Payer: Medicare Other | Admitting: Vascular Surgery

## 2021-04-04 ENCOUNTER — Ambulatory Visit (HOSPITAL_COMMUNITY)
Admission: RE | Admit: 2021-04-04 | Discharge: 2021-04-04 | Disposition: A | Discharge status: Home / Self Care | Payer: Medicare Other | Attending: Surgery | Admitting: Surgery

## 2021-04-04 ENCOUNTER — Encounter (HOSPITAL_COMMUNITY): Payer: Self-pay | Admitting: Surgery

## 2021-04-04 DIAGNOSIS — J45909 Unspecified asthma, uncomplicated: Secondary | ICD-10-CM | POA: Insufficient documentation

## 2021-04-04 DIAGNOSIS — Z992 Dependence on renal dialysis: Secondary | ICD-10-CM | POA: Insufficient documentation

## 2021-04-04 DIAGNOSIS — N186 End stage renal disease: Secondary | ICD-10-CM | POA: Diagnosis not present

## 2021-04-04 DIAGNOSIS — I2510 Atherosclerotic heart disease of native coronary artery without angina pectoris: Secondary | ICD-10-CM | POA: Insufficient documentation

## 2021-04-04 DIAGNOSIS — Z8673 Personal history of transient ischemic attack (TIA), and cerebral infarction without residual deficits: Secondary | ICD-10-CM | POA: Insufficient documentation

## 2021-04-04 DIAGNOSIS — E785 Hyperlipidemia, unspecified: Secondary | ICD-10-CM | POA: Diagnosis not present

## 2021-04-04 DIAGNOSIS — Z7951 Long term (current) use of inhaled steroids: Secondary | ICD-10-CM | POA: Insufficient documentation

## 2021-04-04 DIAGNOSIS — K219 Gastro-esophageal reflux disease without esophagitis: Secondary | ICD-10-CM | POA: Diagnosis not present

## 2021-04-04 DIAGNOSIS — I082 Rheumatic disorders of both aortic and tricuspid valves: Secondary | ICD-10-CM | POA: Diagnosis not present

## 2021-04-04 DIAGNOSIS — I272 Pulmonary hypertension, unspecified: Secondary | ICD-10-CM | POA: Insufficient documentation

## 2021-04-04 DIAGNOSIS — I132 Hypertensive heart and chronic kidney disease with heart failure and with stage 5 chronic kidney disease, or end stage renal disease: Secondary | ICD-10-CM | POA: Diagnosis present

## 2021-04-04 DIAGNOSIS — Z981 Arthrodesis status: Secondary | ICD-10-CM | POA: Diagnosis not present

## 2021-04-04 DIAGNOSIS — I5032 Chronic diastolic (congestive) heart failure: Secondary | ICD-10-CM | POA: Diagnosis not present

## 2021-04-04 DIAGNOSIS — I712 Thoracic aortic aneurysm, without rupture: Secondary | ICD-10-CM | POA: Insufficient documentation

## 2021-04-04 DIAGNOSIS — N185 Chronic kidney disease, stage 5: Secondary | ICD-10-CM

## 2021-04-04 DIAGNOSIS — Z79899 Other long term (current) drug therapy: Secondary | ICD-10-CM | POA: Diagnosis not present

## 2021-04-04 HISTORY — PX: AV FISTULA PLACEMENT: SHX1204

## 2021-04-04 HISTORY — DX: Nonrheumatic aortic (valve) stenosis: I35.0

## 2021-04-04 LAB — POCT I-STAT, CHEM 8
BUN: 48 mg/dL — ABNORMAL HIGH (ref 8–23)
Calcium, Ion: 1.22 mmol/L (ref 1.15–1.40)
Chloride: 107 mmol/L (ref 98–111)
Creatinine, Ser: 8.8 mg/dL — ABNORMAL HIGH (ref 0.61–1.24)
Glucose, Bld: 85 mg/dL (ref 70–99)
HCT: 28 % — ABNORMAL LOW (ref 39.0–52.0)
Hemoglobin: 9.5 g/dL — ABNORMAL LOW (ref 13.0–17.0)
Potassium: 4.9 mmol/L (ref 3.5–5.1)
Sodium: 142 mmol/L (ref 135–145)
TCO2: 23 mmol/L (ref 22–32)

## 2021-04-04 SURGERY — ARTERIOVENOUS (AV) FISTULA CREATION
Anesthesia: Monitor Anesthesia Care | Site: Arm Upper | Laterality: Right

## 2021-04-04 MED ORDER — MIDAZOLAM HCL 2 MG/2ML IJ SOLN
INTRAMUSCULAR | Status: DC | PRN
  Administered 2021-04-04 (×2): 1 mg via INTRAVENOUS

## 2021-04-04 MED ORDER — CHLORHEXIDINE GLUCONATE 0.12 % MT SOLN
OROMUCOSAL | Status: AC
  Administered 2021-04-04: 15 mL via OROMUCOSAL
  Filled 2021-04-04: qty 15

## 2021-04-04 MED ORDER — CEFAZOLIN SODIUM-DEXTROSE 2-4 GM/100ML-% IV SOLN
2.0000 g | INTRAVENOUS | Status: AC
  Administered 2021-04-04: 2 g via INTRAVENOUS
  Filled 2021-04-04: qty 100

## 2021-04-04 MED ORDER — METOPROLOL TARTRATE 50 MG PO TABS
ORAL_TABLET | ORAL | Status: AC
  Filled 2021-04-04: qty 2

## 2021-04-04 MED ORDER — HEPARIN 6000 UNIT IRRIGATION SOLUTION
Status: AC
  Filled 2021-04-04: qty 500

## 2021-04-04 MED ORDER — FENTANYL CITRATE (PF) 100 MCG/2ML IJ SOLN
INTRAMUSCULAR | Status: AC
  Filled 2021-04-04: qty 2

## 2021-04-04 MED ORDER — HEPARIN 6000 UNIT IRRIGATION SOLUTION
Status: DC | PRN
  Administered 2021-04-04: 1

## 2021-04-04 MED ORDER — HEPARIN SODIUM (PORCINE) 1000 UNIT/ML IJ SOLN
1.6000 mL | Freq: Once | INTRAMUSCULAR | Status: AC
  Administered 2021-04-04: 1600 [IU]
  Filled 2021-04-04: qty 1.6

## 2021-04-04 MED ORDER — MEPIVACAINE HCL (PF) 2 % IJ SOLN
INTRAMUSCULAR | Status: DC | PRN
  Administered 2021-04-04: 25 mL

## 2021-04-04 MED ORDER — PROPOFOL 10 MG/ML IV BOLUS
INTRAVENOUS | Status: AC
  Filled 2021-04-04: qty 20

## 2021-04-04 MED ORDER — TRAMADOL HCL 50 MG PO TABS: 50.0000 mg | ORAL_TABLET | Freq: Four times a day (QID) | ORAL | 0 refills | Status: DC | PRN

## 2021-04-04 MED ORDER — OXYCODONE HCL 5 MG PO TABS
5.0000 mg | ORAL_TABLET | Freq: Once | ORAL | Status: AC | PRN
  Administered 2021-04-04: 5 mg via ORAL

## 2021-04-04 MED ORDER — 0.9 % SODIUM CHLORIDE (POUR BTL) OPTIME
TOPICAL | Status: DC | PRN
  Administered 2021-04-04: 1000 mL

## 2021-04-04 MED ORDER — SODIUM CHLORIDE 0.9 % IV SOLN: INTRAVENOUS | Status: DC

## 2021-04-04 MED ORDER — OXYCODONE HCL 5 MG/5ML PO SOLN: 5.0000 mg | Freq: Once | ORAL | Status: AC | PRN

## 2021-04-04 MED ORDER — MEPERIDINE HCL 25 MG/ML IJ SOLN: 6.2500 mg | INTRAMUSCULAR | Status: DC | PRN

## 2021-04-04 MED ORDER — OXYCODONE HCL 5 MG PO TABS
ORAL_TABLET | ORAL | Status: AC
  Filled 2021-04-04: qty 1

## 2021-04-04 MED ORDER — LACTATED RINGERS IV SOLN: INTRAVENOUS | Status: DC

## 2021-04-04 MED ORDER — ACETAMINOPHEN 325 MG PO TABS: 325.0000 mg | ORAL_TABLET | ORAL | Status: DC | PRN

## 2021-04-04 MED ORDER — PROPOFOL 10 MG/ML IV BOLUS
INTRAVENOUS | Status: DC | PRN
  Administered 2021-04-04 (×2): 10 mg via INTRAVENOUS

## 2021-04-04 MED ORDER — CHLORHEXIDINE GLUCONATE 4 % EX LIQD: 60.0000 mL | Freq: Once | CUTANEOUS | Status: DC

## 2021-04-04 MED ORDER — PHENYLEPHRINE HCL-NACL 20-0.9 MG/250ML-% IV SOLN
INTRAVENOUS | Status: DC | PRN
Start: 2021-04-04 — End: 2021-04-04
  Administered 2021-04-04: 20 ug/min via INTRAVENOUS

## 2021-04-04 MED ORDER — ORAL CARE MOUTH RINSE: 15.0000 mL | Freq: Once | OROMUCOSAL | Status: AC

## 2021-04-04 MED ORDER — CHLORHEXIDINE GLUCONATE 0.12 % MT SOLN: 15.0000 mL | Freq: Once | OROMUCOSAL | Status: AC

## 2021-04-04 MED ORDER — FENTANYL CITRATE (PF) 250 MCG/5ML IJ SOLN
INTRAMUSCULAR | Status: AC
  Filled 2021-04-04: qty 5

## 2021-04-04 MED ORDER — LIDOCAINE-EPINEPHRINE (PF) 1 %-1:200000 IJ SOLN
INTRAMUSCULAR | Status: AC
  Filled 2021-04-04: qty 30

## 2021-04-04 MED ORDER — DEXAMETHASONE SODIUM PHOSPHATE 10 MG/ML IJ SOLN
INTRAMUSCULAR | Status: DC | PRN
  Administered 2021-04-04: 5 mg

## 2021-04-04 MED ORDER — FENTANYL CITRATE (PF) 100 MCG/2ML IJ SOLN
25.0000 ug | INTRAMUSCULAR | Status: DC | PRN
  Administered 2021-04-04: 25 ug via INTRAVENOUS

## 2021-04-04 MED ORDER — ACETAMINOPHEN 160 MG/5ML PO SOLN: 325.0000 mg | ORAL | Status: DC | PRN

## 2021-04-04 MED ORDER — PROPOFOL 500 MG/50ML IV EMUL
INTRAVENOUS | Status: DC | PRN
  Administered 2021-04-04: 25 ug/kg/min via INTRAVENOUS

## 2021-04-04 MED ORDER — ONDANSETRON HCL 4 MG/2ML IJ SOLN: 4.0000 mg | Freq: Once | INTRAMUSCULAR | Status: DC | PRN

## 2021-04-04 MED ORDER — METOPROLOL TARTRATE 100 MG PO TABS
100.0000 mg | ORAL_TABLET | Freq: Once | ORAL | Status: AC
  Administered 2021-04-04: 100 mg via ORAL
  Filled 2021-04-04: qty 1

## 2021-04-04 MED ORDER — MIDAZOLAM HCL 2 MG/2ML IJ SOLN
INTRAMUSCULAR | Status: AC
  Filled 2021-04-04: qty 2

## 2021-04-04 MED ORDER — FENTANYL CITRATE (PF) 250 MCG/5ML IJ SOLN
INTRAMUSCULAR | Status: DC | PRN
  Administered 2021-04-04: 50 ug via INTRAVENOUS

## 2021-04-04 MED ORDER — EPHEDRINE SULFATE-NACL 50-0.9 MG/10ML-% IV SOSY
PREFILLED_SYRINGE | INTRAVENOUS | Status: DC | PRN
  Administered 2021-04-04: 10 mg via INTRAVENOUS
  Administered 2021-04-04 (×2): 5 mg via INTRAVENOUS

## 2021-04-04 SURGICAL SUPPLY — 38 items
ARMBAND PINK RESTRICT EXTREMIT (MISCELLANEOUS) ×4 IMPLANT
BAG COUNTER SPONGE SURGICOUNT (BAG) ×2 IMPLANT
CANISTER SUCT 3000ML PPV (MISCELLANEOUS) ×2 IMPLANT
CLIP TI MEDIUM 6 (CLIP) ×2 IMPLANT
CLIP TI WIDE RED SMALL 6 (CLIP) ×2 IMPLANT
CLIP VESOCCLUDE MED 6/CT (CLIP) ×2 IMPLANT
CLIP VESOCCLUDE SM WIDE 6/CT (CLIP) ×2 IMPLANT
COVER PROBE W GEL 5X96 (DRAPES) ×2 IMPLANT
DERMABOND ADVANCED (GAUZE/BANDAGES/DRESSINGS) ×1
DERMABOND ADVANCED .7 DNX12 (GAUZE/BANDAGES/DRESSINGS) ×1 IMPLANT
DRSG COVADERM 4X6 (GAUZE/BANDAGES/DRESSINGS) ×2 IMPLANT
ELECT REM PT RETURN 9FT ADLT (ELECTROSURGICAL) ×2
ELECTRODE REM PT RTRN 9FT ADLT (ELECTROSURGICAL) ×1 IMPLANT
GAUZE 4X4 16PLY ~~LOC~~+RFID DBL (SPONGE) ×2 IMPLANT
GAUZE SPONGE 4X4 12PLY STRL (GAUZE/BANDAGES/DRESSINGS) ×2 IMPLANT
GLOVE SURG POLYISO LF SZ7.5 (GLOVE) ×2 IMPLANT
GLOVE SURG UNDER POLY LF SZ7 (GLOVE) ×2 IMPLANT
GLOVE SURG UNDER POLY LF SZ7.5 (GLOVE) ×8 IMPLANT
GOWN STRL REUS W/ TWL LRG LVL3 (GOWN DISPOSABLE) ×2 IMPLANT
GOWN STRL REUS W/ TWL XL LVL3 (GOWN DISPOSABLE) ×1 IMPLANT
GOWN STRL REUS W/TWL LRG LVL3 (GOWN DISPOSABLE) ×4
GOWN STRL REUS W/TWL XL LVL3 (GOWN DISPOSABLE) ×2
HEMOSTAT SNOW SURGICEL 2X4 (HEMOSTASIS) IMPLANT
KIT BASIN OR (CUSTOM PROCEDURE TRAY) ×2 IMPLANT
KIT TURNOVER KIT B (KITS) ×2 IMPLANT
NS IRRIG 1000ML POUR BTL (IV SOLUTION) ×2 IMPLANT
PACK CV ACCESS (CUSTOM PROCEDURE TRAY) ×2 IMPLANT
PAD ARMBOARD 7.5X6 YLW CONV (MISCELLANEOUS) ×4 IMPLANT
SPONGE T-LAP 18X18 ~~LOC~~+RFID (SPONGE) ×4 IMPLANT
SUT MNCRL AB 4-0 PS2 18 (SUTURE) ×4 IMPLANT
SUT PROLENE 5 0 C 1 36 (SUTURE) ×2 IMPLANT
SUT PROLENE 6 0 CC (SUTURE) ×4 IMPLANT
SUT VIC AB 3-0 SH 27 (SUTURE) ×4
SUT VIC AB 3-0 SH 27X BRD (SUTURE) ×2 IMPLANT
SUT VICRYL 4-0 PS2 18IN ABS (SUTURE) ×2 IMPLANT
TOWEL GREEN STERILE (TOWEL DISPOSABLE) ×2 IMPLANT
UNDERPAD 30X36 HEAVY ABSORB (UNDERPADS AND DIAPERS) ×2 IMPLANT
WATER STERILE IRR 1000ML POUR (IV SOLUTION) ×2 IMPLANT

## 2021-04-04 NOTE — Transfer of Care (Signed)
Immediate Anesthesia Transfer of Care Note  Patient: Thomas Robinson  Procedure(s) Performed: RIGHT ARTERIOVENOUS (AV) FISTULA CREATION (Right: Arm Upper)  Patient Location: PACU  Anesthesia Type:MAC and Regional  Level of Consciousness: awake, alert  and oriented  Airway & Oxygen Therapy: Patient Spontanous Breathing and Patient connected to face mask oxygen  Post-op Assessment: Report given to RN and Post -op Vital signs reviewed and stable  Post vital signs: Reviewed and stable  Last Vitals:  Vitals Value Taken Time  BP 113/89 04/04/21 0919  Temp    Pulse 63 04/04/21 0921  Resp 40 04/04/21 0921  SpO2 99 % 04/04/21 0921  Vitals shown include unvalidated device data.  Last Pain:  Vitals:   04/04/21 0603  TempSrc:   PainSc: 5          Complications: No notable events documented.

## 2021-04-04 NOTE — Anesthesia Postprocedure Evaluation (Signed)
Anesthesia Post Note  Patient: Thomas Robinson  Procedure(s) Performed: RIGHT ARTERIOVENOUS (AV) FISTULA CREATION (Right: Arm Upper)     Patient location during evaluation: PACU Anesthesia Type: Regional and MAC Level of consciousness: awake and alert Pain management: pain level controlled Vital Signs Assessment: post-procedure vital signs reviewed and stable Respiratory status: spontaneous breathing, nonlabored ventilation, respiratory function stable and patient connected to nasal cannula oxygen Cardiovascular status: stable and blood pressure returned to baseline Postop Assessment: no apparent nausea or vomiting Anesthetic complications: no   No notable events documented.  Last Vitals:  Vitals:   04/04/21 0935 04/04/21 0950  BP: (!) 129/57 (!) 140/53  Pulse: (!) 51 66  Resp: 15 (!) 21  Temp:  36.5 C  SpO2: 99% (!) 88%    Last Pain:  Vitals:   04/04/21 0950  TempSrc:   PainSc: 3                  Bennett Ram

## 2021-04-04 NOTE — Interval H&P Note (Signed)
History and Physical Interval Note:  04/04/2021 7:21 AM  Thomas Robinson  has presented today for surgery, with the diagnosis of ESRD.  The various methods of treatment have been discussed with the patient and family. After consideration of risks, benefits and other options for treatment, the patient has consented to  Procedure(s): ARTERIOVENOUS (AV) FISTULA CREATION RIGHT VERSUS GRAFT (Right) as a surgical intervention.  The patient's history has been reviewed, patient examined, no change in status, stable for surgery.  I have reviewed the patient's chart and labs.  Questions were answered to the patient's satisfaction.     [name]

## 2021-04-04 NOTE — Discharge Instructions (Signed)
[facility]  Discharge Instructions  AV Fistula or Graft Surgery for Dialysis Access  Please refer to the following instructions for your post-procedure care. Your surgeon or physician assistant will discuss any changes with you.  Activity  You may drive the day following your surgery, if you are comfortable and no longer taking prescription pain medication. Resume full activity as the soreness in your incision resolves.  Bathing/Showering  You may shower after you go home. Keep your incision dry for 48 hours. Do not soak in a bathtub, hot tub, or swim until the incision heals completely. You may not shower if you have a hemodialysis catheter.  Incision Care  Clean your incision with mild soap and water after 48 hours. Pat the area dry with a clean towel. You do not need a bandage unless otherwise instructed. Do not apply any ointments or creams to your incision. You may have skin glue on your incision. Do not peel it off. It will come off on its own in about one week. Your arm may swell a bit after surgery. To reduce swelling use pillows to elevate your arm so it is above your heart. Your doctor will tell you if you need to lightly wrap your arm with an ACE bandage.  Diet  Resume your normal diet. There are not special food restrictions following this procedure. In order to heal from your surgery, it is CRITICAL to get adequate nutrition. Your body requires vitamins, minerals, and protein. Vegetables are the best source of vitamins and minerals. Vegetables also provide the perfect balance of protein. Processed food has little nutritional value, so try to avoid this.  Medications  Resume taking all of your medications. If your incision is causing pain, you may take over-the counter pain relievers such as acetaminophen (Tylenol). If you were prescribed a stronger pain medication, please be aware these medications can cause nausea and constipation. Prevent  nausea by taking the medication with a snack or meal. Avoid constipation by drinking plenty of fluids and eating foods with high amount of fiber, such as fruits, vegetables, and grains.  Do not take Tylenol if you are taking prescription pain medications.  Follow up Your surgeon may want to see you in the office following your access surgery. If so, this will be arranged at the time of your surgery.  Please call us immediately for any of the following conditions:  Increased pain, redness, drainage (pus) from your incision site Fever of 101 degrees or higher Severe or worsening pain at your incision site Hand pain or numbness.  Reduce your risk of vascular disease:  Stop smoking. If you would like help, call [facility] at 1-800-QUIT-NOW 762-695-1645) or [place] [facility] [place] your cholesterol Maintain a desired weight Control your diabetes Keep your blood pressure down  Dialysis  It will take several weeks to several months for your new dialysis access to be ready for use. Your surgeon will determine when it is okay to use it. Your nephrologist will continue to direct your dialysis. You can continue to use your Permcath until your new access is ready for use.   04/04/2021 Thomas Robinson [id number] 09/26/1960  Surgeon(s): Serafina Mitchell, MD  Procedure(s): RIGHT ARTERIOVENOUS (AV) FISTULA CREATION   May stick graft immediately   May stick graft on designated area only:   X Do not stick Right AV fistula for 12 weeks    If you have any questions, please call the office at  [phone]. 

## 2021-04-04 NOTE — Op Note (Signed)
    Patient name: Thomas Robinson MRN: [id number] DOB: 1961/01/19 Sex: male  04/04/2021 Pre-operative Diagnosis: ESRD Post-operative diagnosis:  Same Surgeon:  [name] Assistants:  Ivin Booty Procedure:   First stage right basilic vein fistula creation Anesthesia: Regional Blood Loss: Minimal Specimens: None  Findings: Healthy 3 to 4 mm basilic vein  Indications: This is a 60 year old gentleman who dialyzes through a catheter, and is in need of permanent access.  Procedure:  The patient was identified in the holding area and taken to [place] [facility]  The patient was then placed supine on the table. regional anesthesia was administered.  The patient was prepped and draped in the usual sterile fashion.  A time out was called and antibiotics were administered.  A PA was necessary to expedite the procedure and assist with technical details.  Ultrasound was used to evaluate the cephalic and basilic vein.  The basilic vein appeared to be the most appropriate for fistula creation.  A oblique incision was made just proximal to the antecubital crease.  I first dissected out the brachial artery which was a 3 to 4 mm disease-free artery.  I then exposed the basilic vein.  Several side branches were ligated between silk ties.  The vein was then transected distally after marking it for orientation.  It was flushed with heparin saline and distended nicely to approximately 3 to 4 mm.  Next, the brachial artery was occluded with baby Gregory clamps and a #11 blade was used to make an arteriotomy which was extended longitudinally with Potts scissors.  The vein was then cut to the appropriate length and spatulated to fit the size the arteriotomy.  A running anastomosis was created with 6-0 Prolene.  Prior to completion the appropriate flushing maneuvers were performed and the anastomosis was completed.  I inspected to the course of the vein to make sure there were no kinks.  The patient had an excellent thrill  within the fistula and brisk radial and ulnar Doppler signals.  Hemostasis was achieved.  The incision was closed in 2 layers of Vicryl followed by Dermabond.  There were no immediate complications.   Disposition: To PACU stable.   Theotis Burrow, M.D., [facility] [place] Office: [phone] Pager:  [phone]

## 2021-04-04 NOTE — Anesthesia Procedure Notes (Signed)
Procedure Name: MAC Date/Time: 04/04/2021 7:36 AM Performed by: Dorthea Cove, CRNA Pre-anesthesia Checklist: Patient identified, Emergency Drugs available, Suction available, Patient being monitored and Timeout performed Patient Re-evaluated:Patient Re-evaluated prior to induction Oxygen Delivery Method: Simple face mask Induction Type: IV induction Placement Confirmation: positive ETCO2 and CO2 detector Dental Injury: Teeth and Oropharynx as per pre-operative assessment

## 2021-04-04 NOTE — Anesthesia Procedure Notes (Addendum)
  Anesthesia Regional Block: Supraclavicular block   Pre-Anesthetic Checklist: , timeout performed,  Correct Patient, Correct Site, Correct Laterality,  Correct Procedure, Correct Position, site marked,  Risks and benefits discussed,  Surgical consent,  Pre-op evaluation,  At surgeon's request and post-op pain management  Laterality: Right  Prep: chloraprep       Needles:  Injection technique: Single-shot  Needle Type: Echogenic Stimulator Needle     Needle Length: 5cm  Needle Gauge: 22     Additional Needles:   Procedures:, nerve stimulator,,, ultrasound used (permanent image in chart),,     Nerve Stimulator or Paresthesia:  Response: hand, 0.45 mA  Additional Responses:   Narrative:  Start time: 04/04/2021 7:00 AM End time: 04/04/2021 7:05 AM Injection made incrementally with aspirations every 5 mL.  Performed by: Personally  Anesthesiologist: Janeece Riggers, MD  Additional Notes: Functioning IV was confirmed and monitors were applied.  A 52mm 22ga Arrow echogenic stimulator needle was used. Sterile prep and drape,hand hygiene and sterile gloves were used. Ultrasound guidance: relevant anatomy identified, needle position confirmed, local anesthetic spread visualized around nerve(s)., vascular puncture avoided.  Image printed for medical record. Negative aspiration and negative test dose prior to incremental administration of local anesthetic. The patient tolerated the procedure well.

## 2021-04-05 ENCOUNTER — Encounter (HOSPITAL_COMMUNITY): Payer: Self-pay | Admitting: Surgery

## 2021-04-07 IMAGING — CR DG CHEST 2V
2 series · 2 of 2 positions shown · non-contrast
Comparison: 07/25/2020.

CLINICAL DATA: Shortness of breath.

EXAM:
CHEST - 2 VIEW

[chest lat]
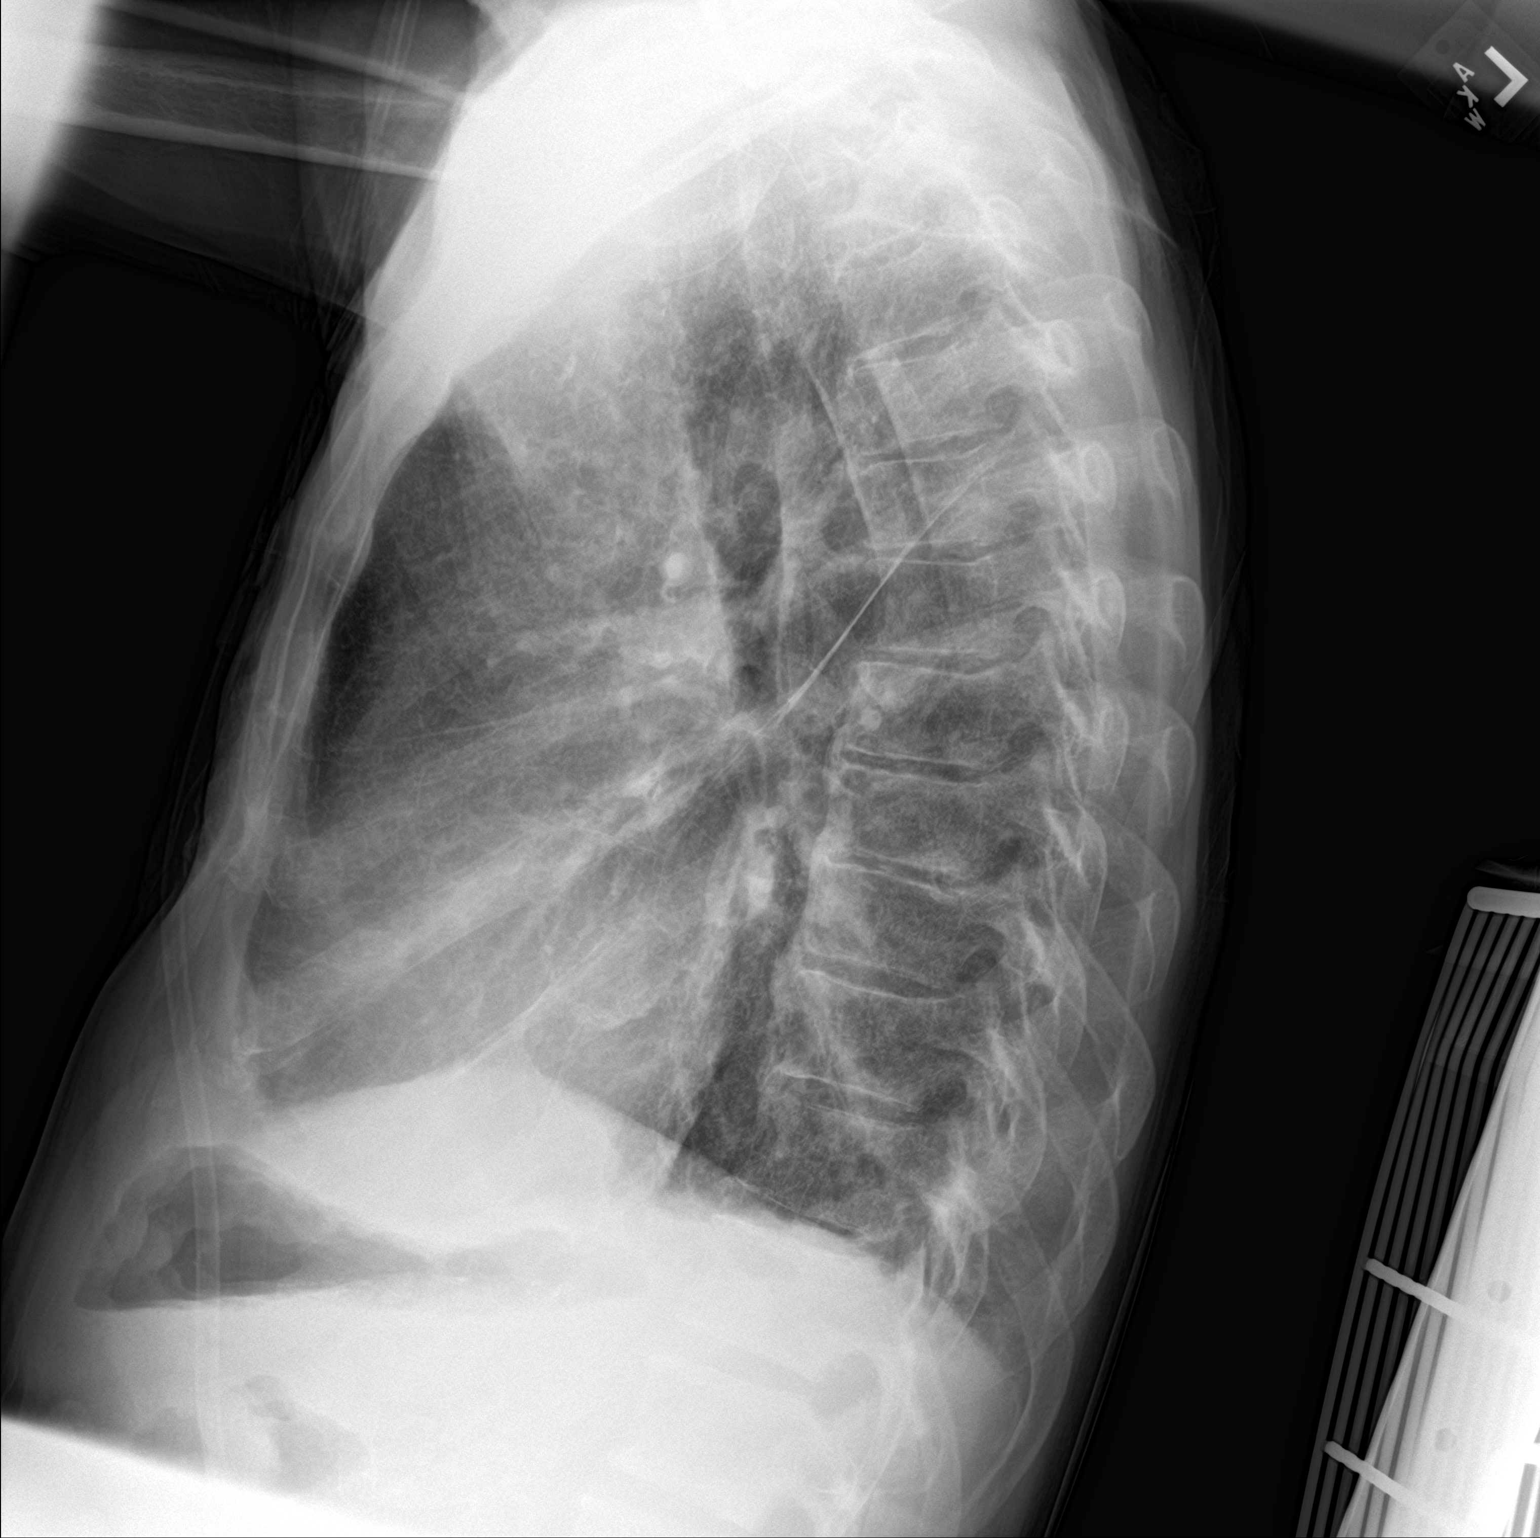

[chest ap]
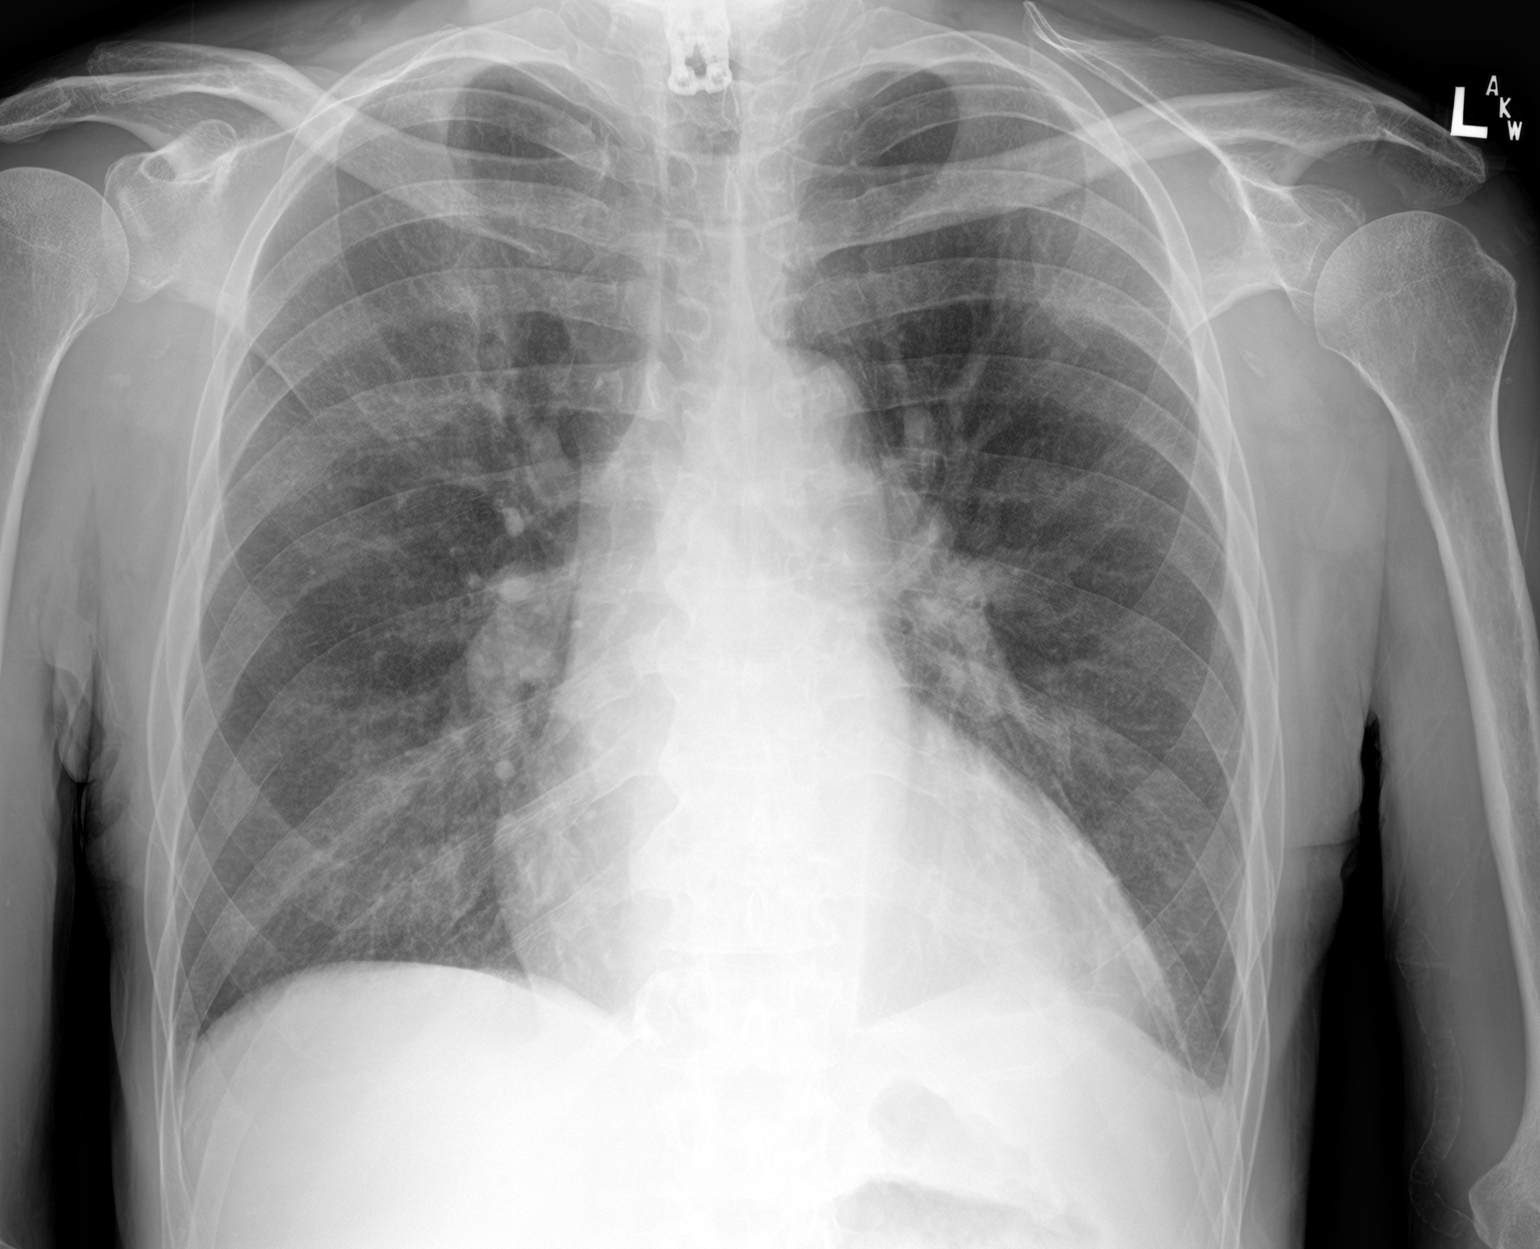

[2 of 2 positions shown; findings below may reference images not displayed]

FINDINGS: Mediastinum and hilar structures normal. Stable cardiomegaly.
Persistent but improved bilateral mild pulmonary infiltrates/edema.
Tiny left pleural effusion again noted. No pneumothorax. Peripheral
vascular calcification. Prior cervicothoracic fusion. Degenerative
changes thoracic spine.
IMPRESSION: 1. Persistent but improved bilateral mild pulmonary
infiltrates/edema. Tiny left pleural effusion again noted.

2.  Stable cardiomegaly.

## 2021-04-10 IMAGING — DX DG CHEST 1V PORT
1 series · 1 of 1 positions shown · non-contrast
Comparison: 08/02/2020 and older studies.

CLINICAL DATA: Pt to triage via [REDACTED]. Has not received dialysis in
11 days because he is "in between dialysis centers". C/o generalized
weakness, dizziness, dry cough, SOB, and chest burning. O2 sats 83%
on room air. Pt placed on 4 liters of oxygen.

EXAM:
PORTABLE CHEST 1 VIEW

[chest]
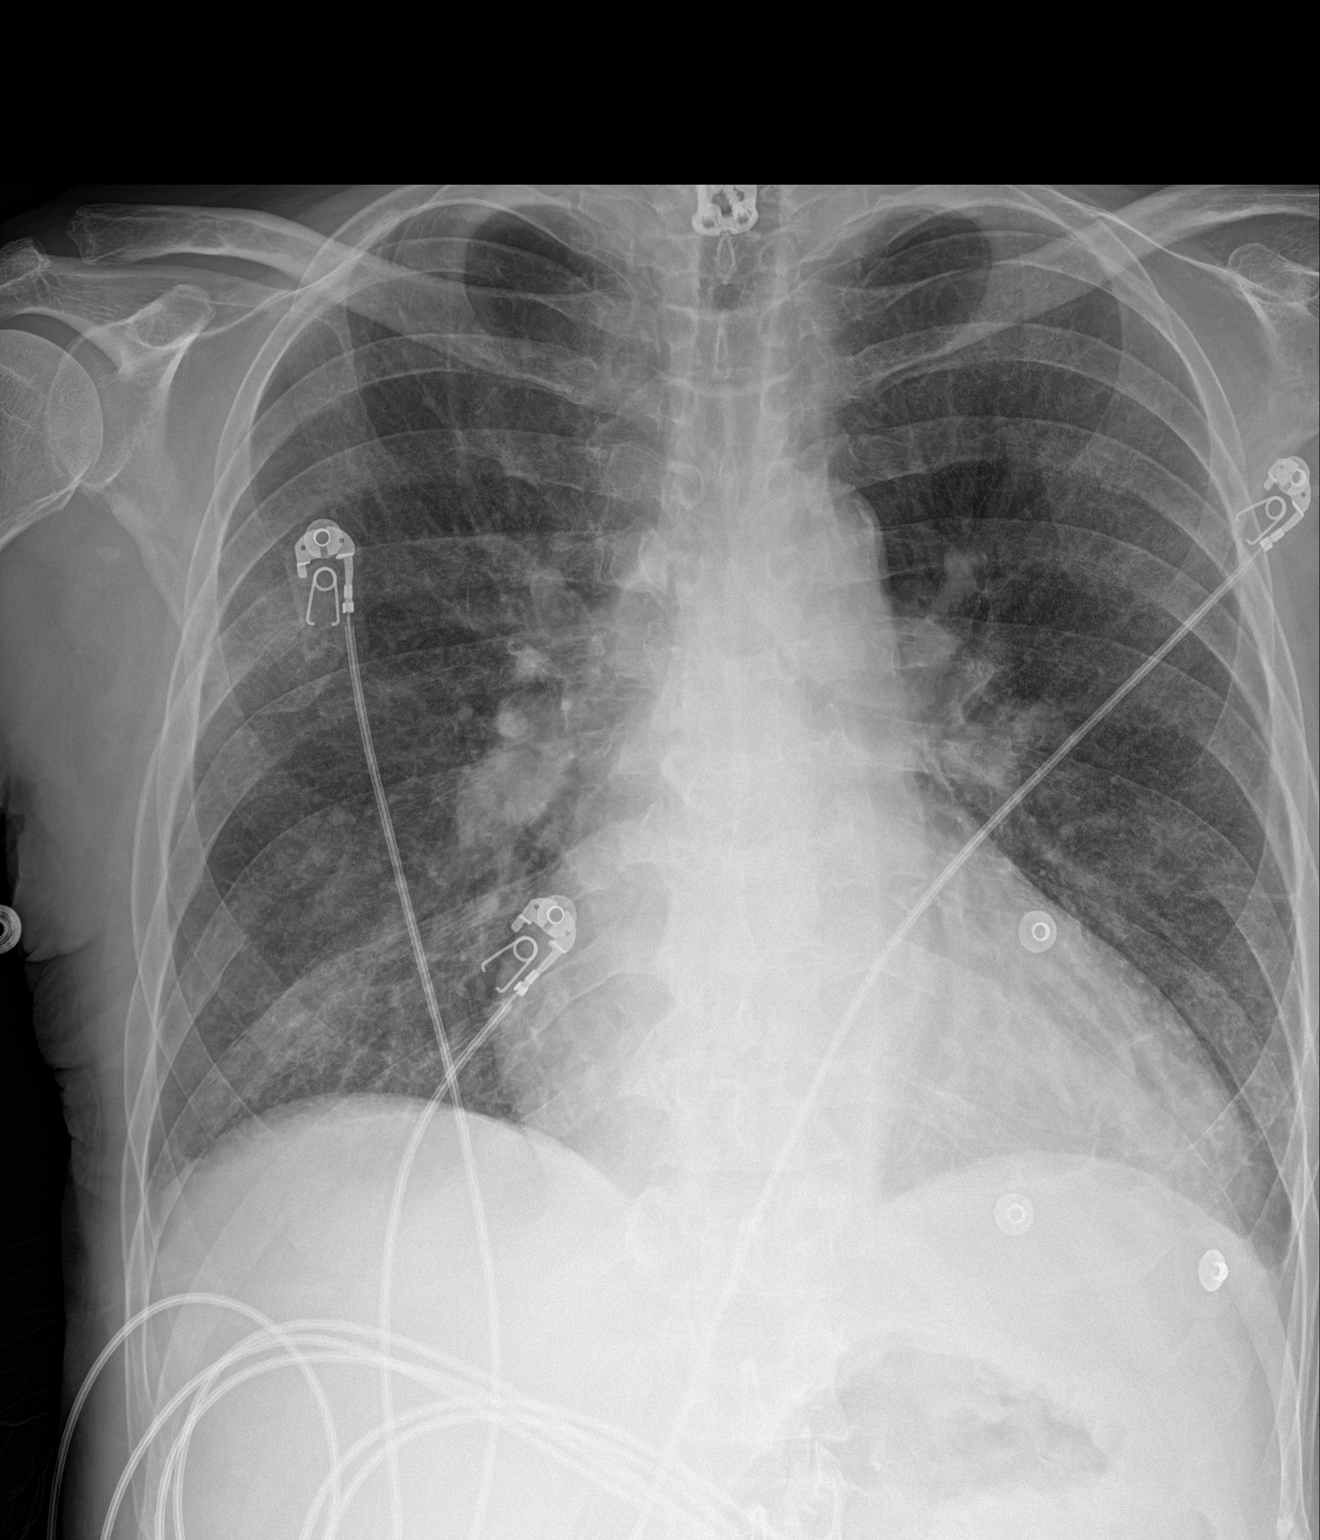

[1 of 1 positions shown; findings below may reference images not displayed]

FINDINGS: Cardiac silhouette is mildly enlarged. No mediastinal or hilar
masses.

Bilateral interstitial prominence is similar to the prior exam, and
similar to older studies. This suggests this is all chronic. No lung
consolidation. No pleural effusion or pneumothorax.

Skeletal structures are grossly intact.
IMPRESSION: 1. Similar appearance to the prior exams. Bilateral interstitial
thickening is most likely chronic. Cannot exclude a degree
superimposed interstitial edema.
2. No evidence of pneumonia.
3. Mild stable cardiomegaly.

## 2021-04-18 IMAGING — DX DG CHEST 1V PORT
1 series · 2 of 2 positions shown · non-contrast
Comparison: 08/05/2020

CLINICAL DATA: Dyspnea, end-stage renal disease

EXAM:
PORTABLE CHEST 1 VIEW

[Series 1: chest · 0.14mm/px · 2 of 2 slices shown]
[im 1/2]
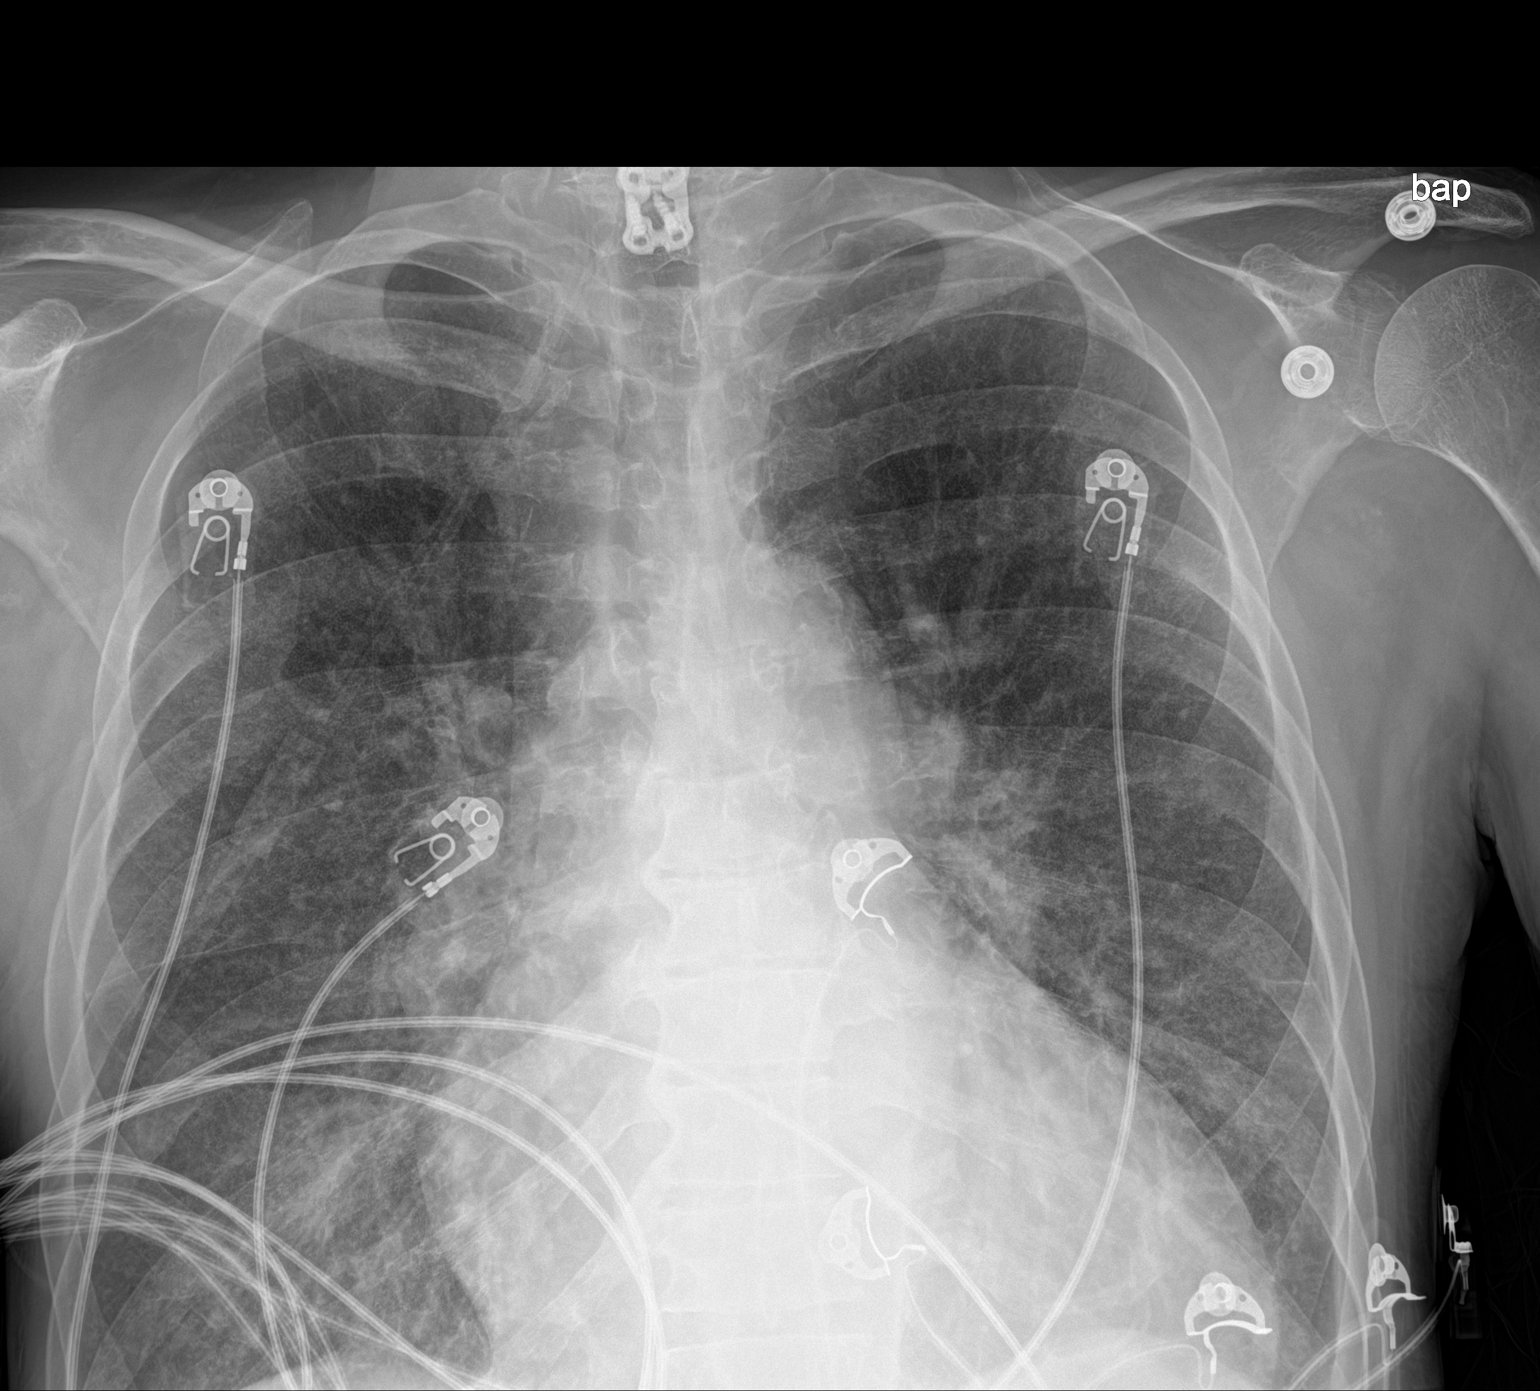
[im 2/2]
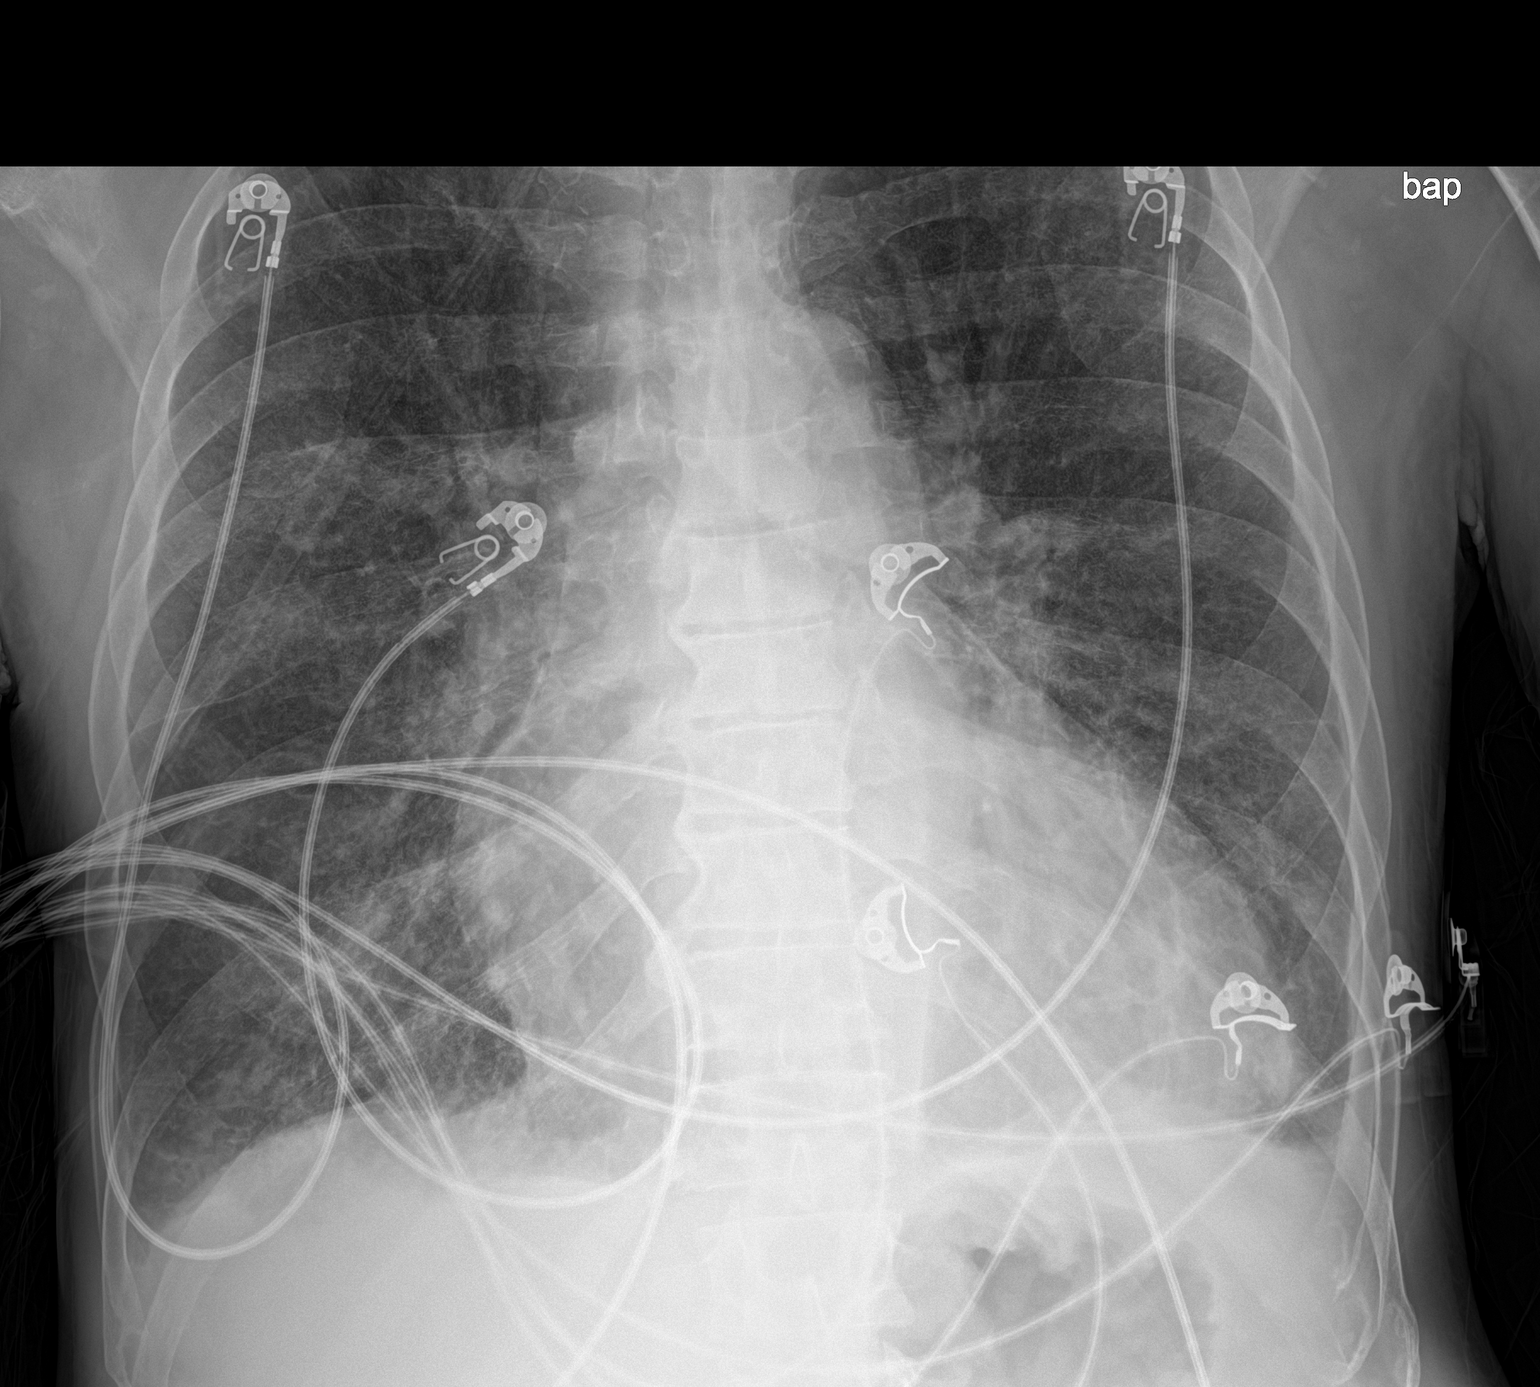

[2 of 2 positions shown; findings below may reference images not displayed]

FINDINGS: The lungs are symmetrically mildly hyperinflated, progressive since
prior examination, in keeping with underlying obstructive pulmonary
disease. No pneumothorax. Tiny bilateral pleural effusions are
present, new since prior examination. There is central pulmonary
vascular engorgement and progressive superimposed perihilar
pulmonary infiltrate, possibly reflecting progressive edema. Mild
cardiomegaly is stable. Cervical fusion hardware again noted. No
acute bone abnormality.,
IMPRESSION: Stable cardiomegaly. Interval development of progressive pulmonary
infiltrate and tiny bilateral pleural effusions possibly reflecting
mild superimposed cardiogenic failure.

Progressive pulmonary hyperinflation in keeping with obstructive
pulmonary disease.

## 2021-05-09 ENCOUNTER — Other Ambulatory Visit: Payer: Self-pay

## 2021-05-09 DIAGNOSIS — Z992 Dependence on renal dialysis: Secondary | ICD-10-CM

## 2021-05-09 DIAGNOSIS — N186 End stage renal disease: Secondary | ICD-10-CM

## 2021-05-10 IMAGING — DX DG CHEST 1V PORT
1 series · 1 of 1 positions shown · non-contrast
Comparison: 08/13/2020

CLINICAL DATA: Dyspnea

EXAM:
PORTABLE CHEST 1 VIEW

[x chest ap]
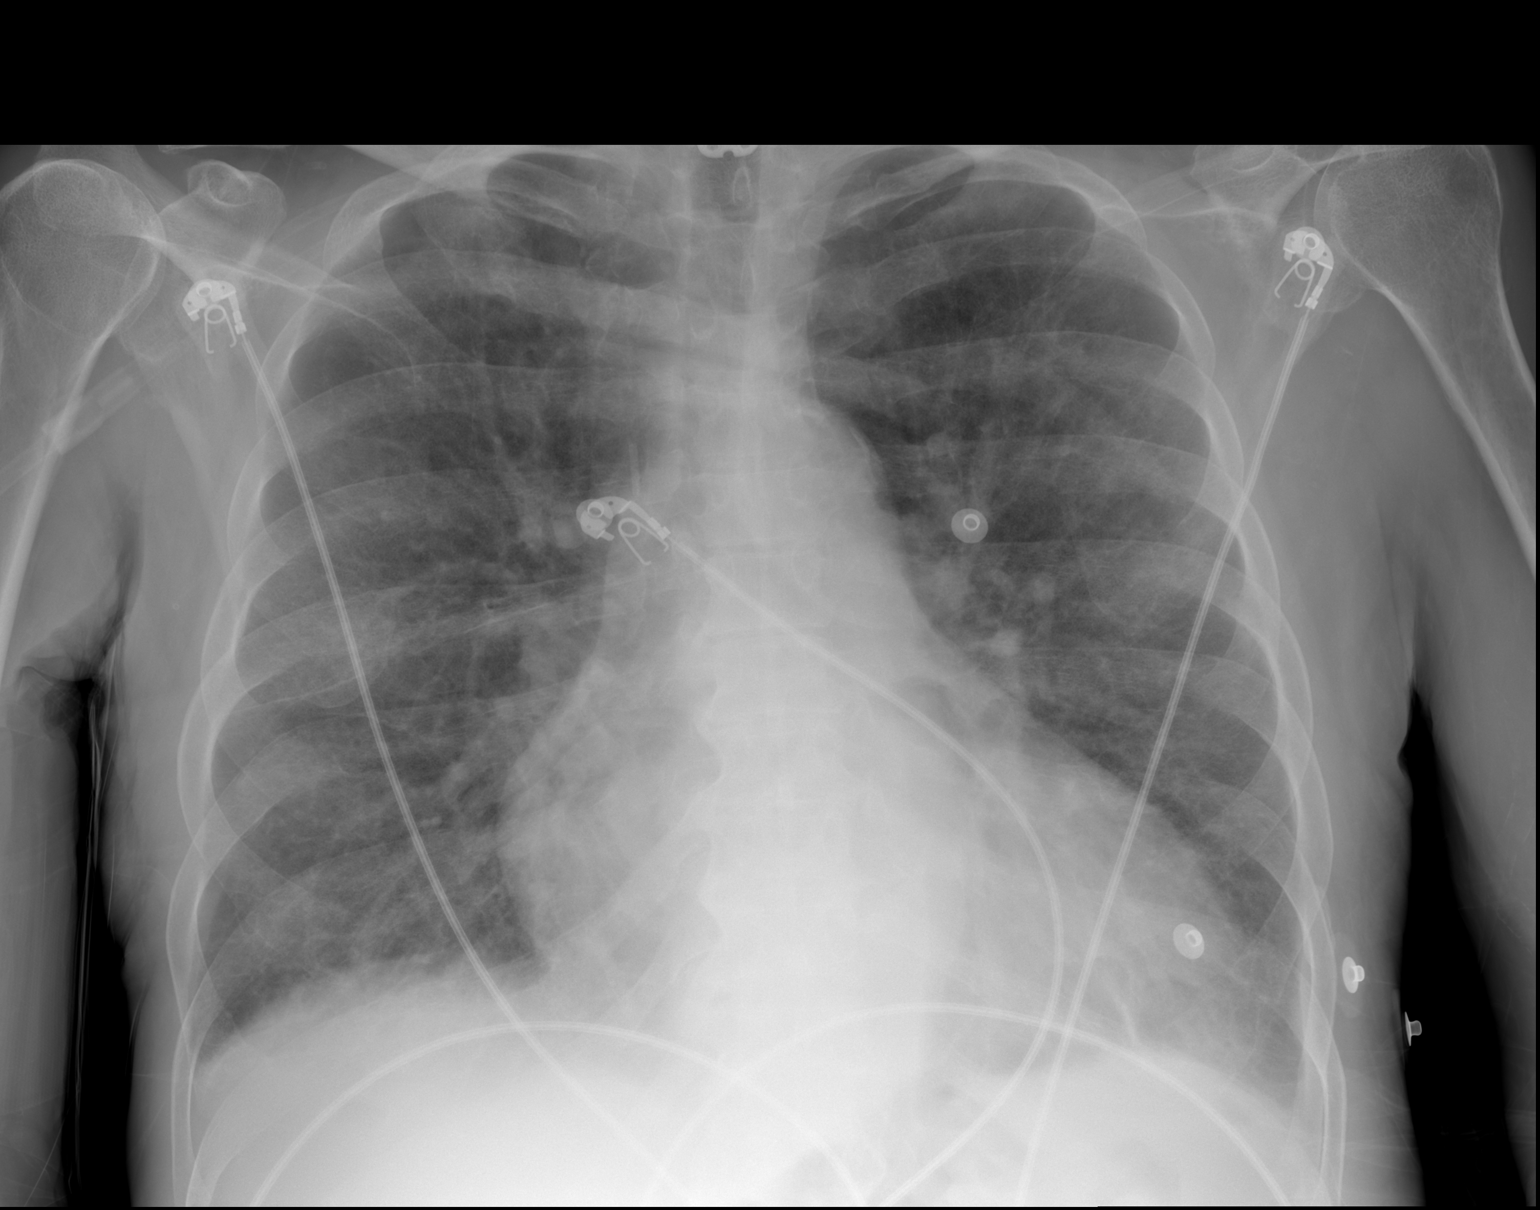

[1 of 1 positions shown; findings below may reference images not displayed]

FINDINGS: The lungs are symmetrically well expanded. There is central
pulmonary vascular congestion. There is superimposed diffuse mild
interstitial pulmonary infiltrate possibly relating changes of mild
diffuse pulmonary edema. No pneumothorax or pleural effusion.
Cardiac size is mildly enlarged, unchanged. No acute bone
abnormality. Cervical fusion hardware again noted.
IMPRESSION: Central pulmonary vascular engorgement and trace interstitial
pulmonary edema most in keeping with mild cardiogenic failure.
Stable cardiomegaly.

## 2021-05-14 ENCOUNTER — Encounter (HOSPITAL_COMMUNITY): Payer: Medicare Other

## 2021-05-14 NOTE — Progress Notes (Deleted)
  POST OPERATIVE OFFICE NOTE    CC:  F/u for surgery  HPI:  This is a 60 y.o. male who is s/p First stage right basilic vein fistula creation on 04/04/21 by Dr. Trula Slade.    Pt returns today for follow up.  Pt states ***  No Known Allergies  Current Outpatient Medications  Medication Sig Dispense Refill   acetaminophen (TYLENOL) 500 MG tablet Take 500 mg by mouth every 6 (six) hours as needed for moderate pain or headache.     albuterol (VENTOLIN HFA) 108 (90 Base) MCG/ACT inhaler Inhale 2 puffs into the lungs in the morning and at bedtime.     amLODipine (NORVASC) 10 MG tablet Take 10 mg by mouth daily.      AURYXIA 1 GM 210 MG(Fe) tablet Take 210-420 mg by mouth 3 (three) times daily with meals.     BREO ELLIPTA 100-25 MCG/INH AEPB Inhale 1 puff into the lungs 2 (two) times daily as needed (sob/wheezing).     cholecalciferol (VITAMIN D3) 25 MCG (1000 UNIT) tablet Take 1,000 Units by mouth once a week.     cinacalcet (SENSIPAR) 60 MG tablet Take 60 mg by mouth every evening.     DIPHENHYDRAMINE HCL PO Take 25 mg by mouth as needed (allergies).     doxercalciferol (HECTOROL) 0.5 MCG capsule Take 0.5 mcg by mouth 3 (three) times a week.     furosemide (LASIX) 80 MG tablet Take 80 mg by mouth as directed. Take Daily PRN On Non-Dialysis Days (Sun, Tues, Thurs, Sat)     hydrALAZINE (APRESOLINE) 25 MG tablet Take 50 mg by mouth in the morning and at bedtime.     metoprolol tartrate (LOPRESSOR) 100 MG tablet Take 0.5 tablets (50 mg total) by mouth 2 (two) times daily.     multivitamin (RENA-VIT) TABS tablet Take 1 tablet by mouth daily.     pregabalin (LYRICA) 25 MG capsule Take 25 mg by mouth daily as needed (pain).     sevelamer carbonate (RENVELA) 800 MG tablet Take 800-1,600 mg by mouth daily as needed (with snacks or meals). With Snacks & Meals     traMADol (ULTRAM) 50 MG tablet Take 1 tablet (50 mg total) by mouth every 6 (six) hours as needed. 6 tablet 0   No current  facility-administered medications for this visit.     ROS:  See HPI  Physical Exam:  ***  Incision:  *** Extremities:  *** Neuro: *** Abdomen:  ***   Assessment/Plan:  This is a 60 y.o. male who is s/p: ***  -***   *** [facility] [phone]   Clinic MD:  ***

## 2021-05-15 IMAGING — DX DG CHEST 1V PORT
1 series · 1 of 1 positions shown · non-contrast
Comparison: Most recent radiograph 09/04/2020.  Additional priors.

CLINICAL DATA: Hypoxia and shortness of breath. Missed dialysis
last week.

EXAM:
PORTABLE CHEST 1 VIEW

[chest]
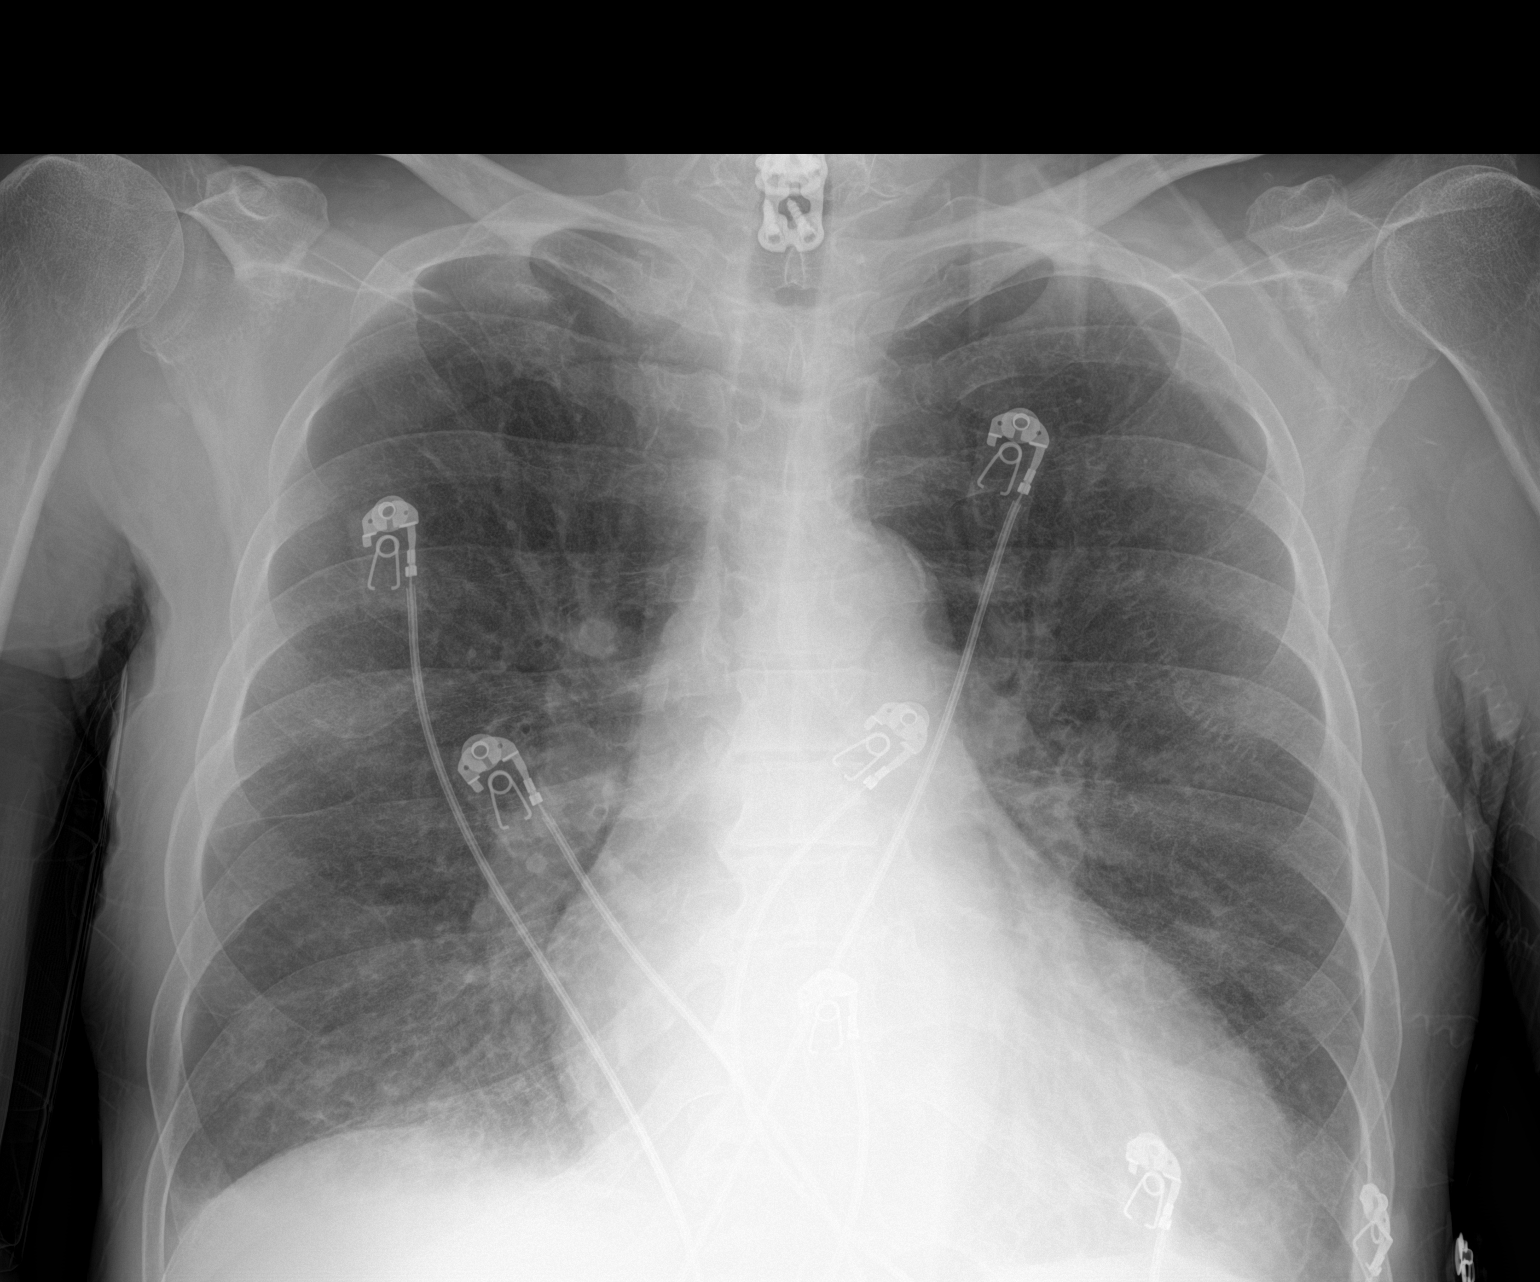

[1 of 1 positions shown; findings below may reference images not displayed]

FINDINGS: Similar cardiomegaly. Unchanged mediastinal contours. Aortic
atherosclerosis. Hazy lung base opacities blunting of the
costophrenic angles likely small effusions. Peribronchial thickening
with features of interstitial edema, similar to prior. No
pneumothorax. No acute osseous abnormalities are seen.
IMPRESSION: Unchanged cardiomegaly. Unchanged pulmonary edema. Probable small
bilateral pleural effusions. Findings consistent with CHF.

## 2021-05-18 ENCOUNTER — Emergency Department (HOSPITAL_COMMUNITY): Payer: Medicare Other

## 2021-05-18 ENCOUNTER — Encounter (HOSPITAL_COMMUNITY): Payer: Self-pay | Admitting: Emergency Medicine

## 2021-05-18 ENCOUNTER — Observation Stay (HOSPITAL_COMMUNITY)
Admission: EM | Admit: 2021-05-18 | Discharge: 2021-05-19 | Disposition: A | Discharge status: Home / Self Care | Payer: Medicare Other | Attending: Family Medicine | Admitting: Family Medicine

## 2021-05-18 ENCOUNTER — Other Ambulatory Visit: Payer: Self-pay

## 2021-05-18 DIAGNOSIS — I132 Hypertensive heart and chronic kidney disease with heart failure and with stage 5 chronic kidney disease, or end stage renal disease: Secondary | ICD-10-CM | POA: Diagnosis not present

## 2021-05-18 DIAGNOSIS — I712 Thoracic aortic aneurysm, without rupture: Secondary | ICD-10-CM | POA: Diagnosis present

## 2021-05-18 DIAGNOSIS — Z8673 Personal history of transient ischemic attack (TIA), and cerebral infarction without residual deficits: Secondary | ICD-10-CM | POA: Insufficient documentation

## 2021-05-18 DIAGNOSIS — R0602 Shortness of breath: Secondary | ICD-10-CM | POA: Diagnosis present

## 2021-05-18 DIAGNOSIS — Z992 Dependence on renal dialysis: Secondary | ICD-10-CM

## 2021-05-18 DIAGNOSIS — M7989 Other specified soft tissue disorders: Secondary | ICD-10-CM

## 2021-05-18 DIAGNOSIS — I7121 Aneurysm of the ascending aorta, without rupture: Secondary | ICD-10-CM | POA: Diagnosis present

## 2021-05-18 DIAGNOSIS — I5032 Chronic diastolic (congestive) heart failure: Secondary | ICD-10-CM | POA: Insufficient documentation

## 2021-05-18 DIAGNOSIS — E8770 Fluid overload, unspecified: Secondary | ICD-10-CM

## 2021-05-18 DIAGNOSIS — J9601 Acute respiratory failure with hypoxia: Principal | ICD-10-CM | POA: Diagnosis present

## 2021-05-18 DIAGNOSIS — D631 Anemia in chronic kidney disease: Secondary | ICD-10-CM | POA: Insufficient documentation

## 2021-05-18 DIAGNOSIS — Z20822 Contact with and (suspected) exposure to covid-19: Secondary | ICD-10-CM | POA: Insufficient documentation

## 2021-05-18 DIAGNOSIS — I160 Hypertensive urgency: Secondary | ICD-10-CM

## 2021-05-18 DIAGNOSIS — J40 Bronchitis, not specified as acute or chronic: Secondary | ICD-10-CM

## 2021-05-18 DIAGNOSIS — Z79899 Other long term (current) drug therapy: Secondary | ICD-10-CM | POA: Insufficient documentation

## 2021-05-18 DIAGNOSIS — N186 End stage renal disease: Secondary | ICD-10-CM | POA: Diagnosis not present

## 2021-05-18 DIAGNOSIS — J45909 Unspecified asthma, uncomplicated: Secondary | ICD-10-CM | POA: Insufficient documentation

## 2021-05-18 LAB — CBC
HCT: 31.3 % — ABNORMAL LOW (ref 39.0–52.0)
Hemoglobin: 10.1 g/dL — ABNORMAL LOW (ref 13.0–17.0)
MCH: 34.2 pg — ABNORMAL HIGH (ref 26.0–34.0)
MCHC: 32.3 g/dL (ref 30.0–36.0)
MCV: 106.1 fL — ABNORMAL HIGH (ref 80.0–100.0)
Platelets: 94 10*3/uL — ABNORMAL LOW (ref 150–400)
RBC: 2.95 MIL/uL — ABNORMAL LOW (ref 4.22–5.81)
RDW: 14.9 % (ref 11.5–15.5)
WBC: 2.9 10*3/uL — ABNORMAL LOW (ref 4.0–10.5)
nRBC: 0 % (ref 0.0–0.2)

## 2021-05-18 LAB — BASIC METABOLIC PANEL
Anion gap: 14 (ref 5–15)
BUN: 27 mg/dL — ABNORMAL HIGH (ref 8–23)
CO2: 26 mmol/L (ref 22–32)
Calcium: 8.7 mg/dL — ABNORMAL LOW (ref 8.9–10.3)
Chloride: 98 mmol/L (ref 98–111)
Creatinine, Ser: 4.9 mg/dL — ABNORMAL HIGH (ref 0.61–1.24)
GFR, Estimated: 13 mL/min — ABNORMAL LOW (ref >60)
Glucose, Bld: 81 mg/dL (ref 70–99)
Potassium: 4.1 mmol/L (ref 3.5–5.1)
Sodium: 138 mmol/L (ref 135–145)

## 2021-05-18 LAB — HEPATIC FUNCTION PANEL
ALT: 9 U/L (ref 0–44)
AST: 28 U/L (ref 15–41)
Albumin: 3.1 g/dL — ABNORMAL LOW (ref 3.5–5.0)
Alkaline Phosphatase: 102 U/L (ref 38–126)
Bilirubin, Direct: 0.3 mg/dL — ABNORMAL HIGH (ref 0.0–0.2)
Indirect Bilirubin: 0.6 mg/dL (ref 0.3–0.9)
Total Bilirubin: 0.9 mg/dL (ref 0.3–1.2)
Total Protein: 6.5 g/dL (ref 6.5–8.1)

## 2021-05-18 LAB — TROPONIN I (HIGH SENSITIVITY)
Troponin I (High Sensitivity): 65 ng/L — ABNORMAL HIGH (ref <18)
Troponin I (High Sensitivity): 65 ng/L — ABNORMAL HIGH (ref <18)

## 2021-05-18 LAB — CBG MONITORING, ED: Glucose-Capillary: 79 mg/dL (ref 70–99)

## 2021-05-18 LAB — RESP PANEL BY RT-PCR (FLU A&B, COVID) ARPGX2
Influenza A by PCR: NEGATIVE
Influenza B by PCR: NEGATIVE
SARS Coronavirus 2 by RT PCR: NEGATIVE

## 2021-05-18 IMAGING — XA IR DAILY SHUNT INTRO NEEDLE/INTRACATH INITIAL W/IMG*L*
5 series · 14 of 24 positions shown · IV contrast (IODINE)
Comparison: None.

INDICATION: 59-year-old male with left radiocephalic fistula created in 8387
status post revision in 9393 presenting with left arm swelling.

EXAM:
DIALYSIS AV FISTULAGRAM

[Series 1: body 4 care · 3 of 9 slices shown (1 of 5)]
[im 1/9]
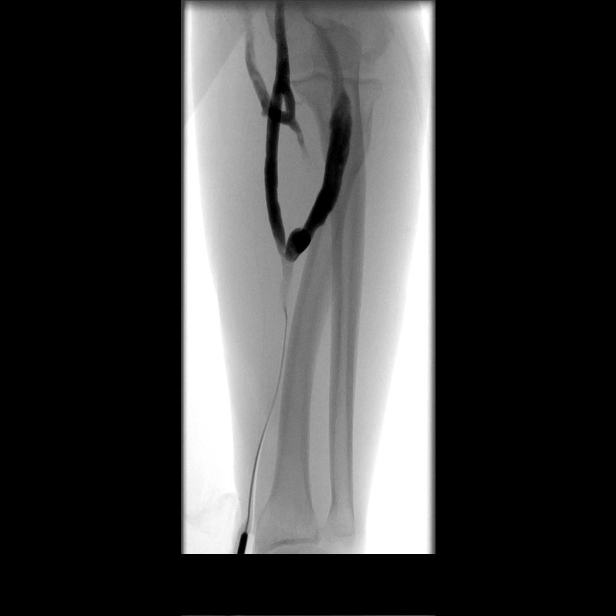
[im 5/9]
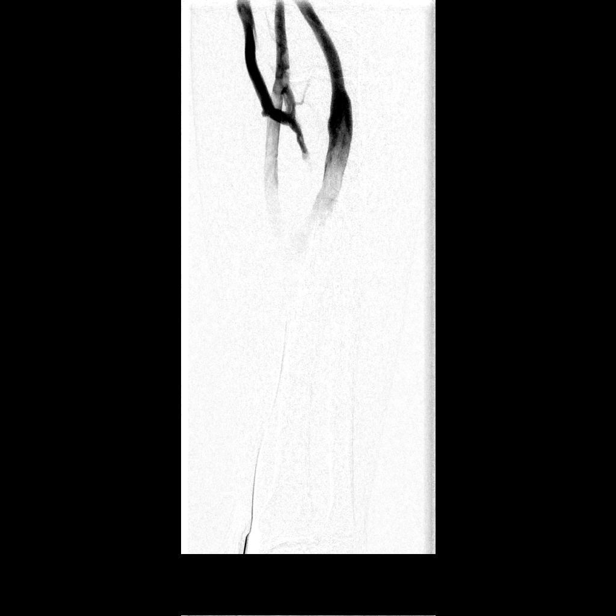
[im 9/9]
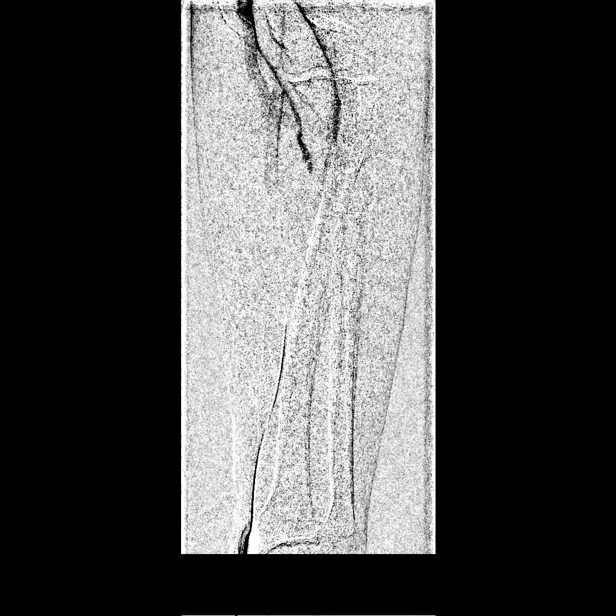

[Series 2: body 4 care · 2 of 5 slices shown (2 of 5)]
[im 3/5]
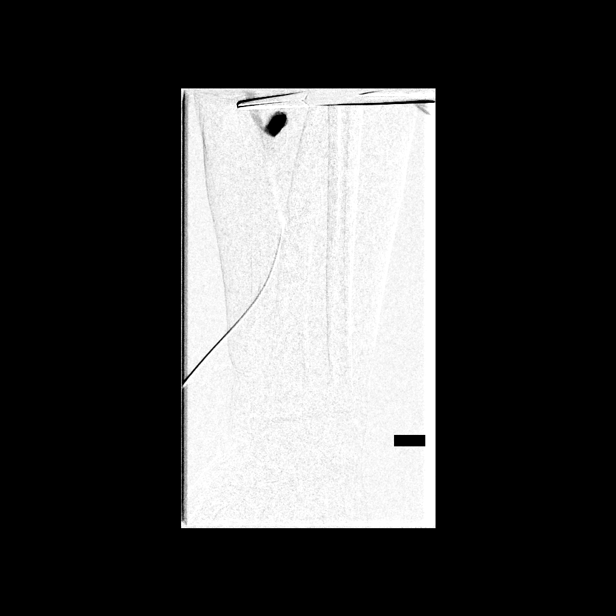
[im 5/5]
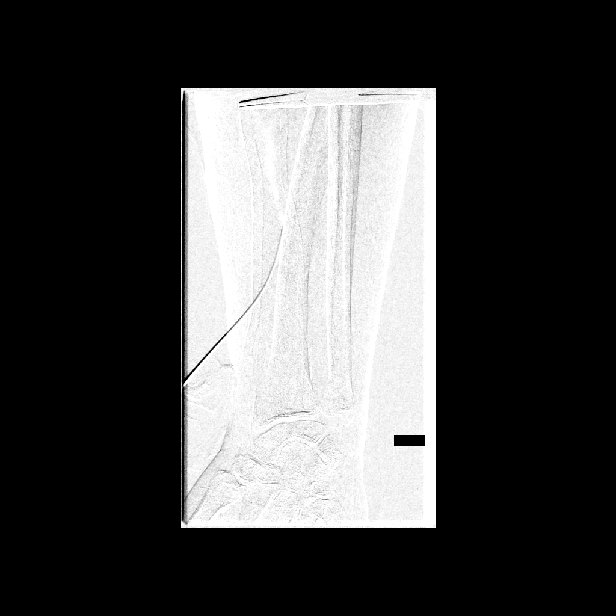

[Series 3: body 4 care · 3 of 9 slices shown (3 of 5)]
[im 3/9]
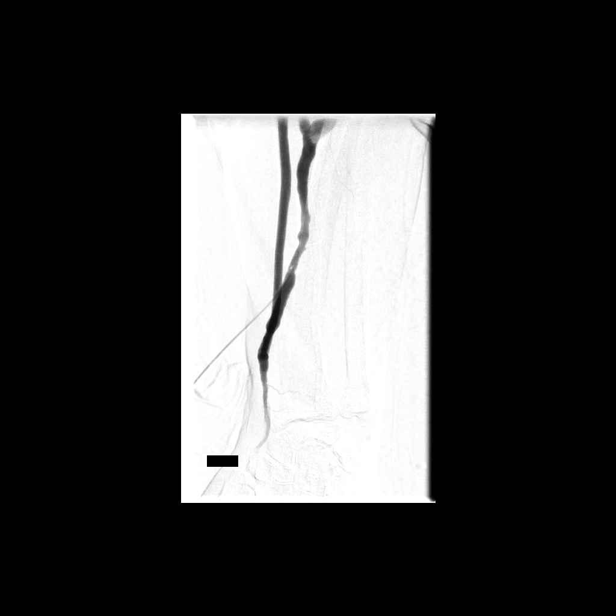
[im 7/9]
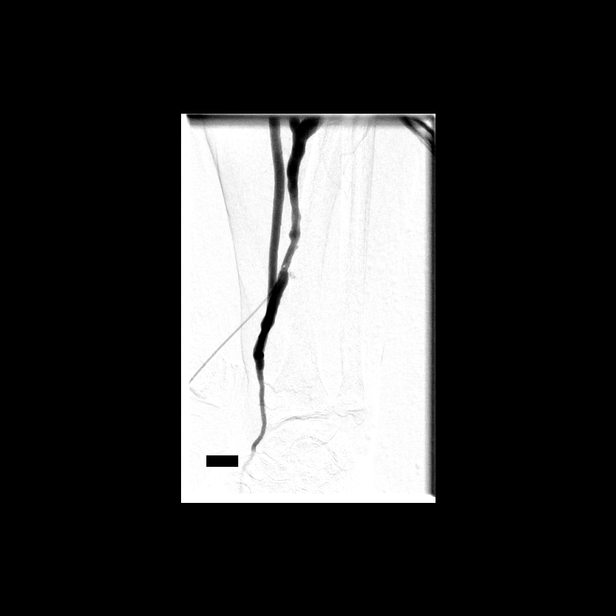
[im 9/9]
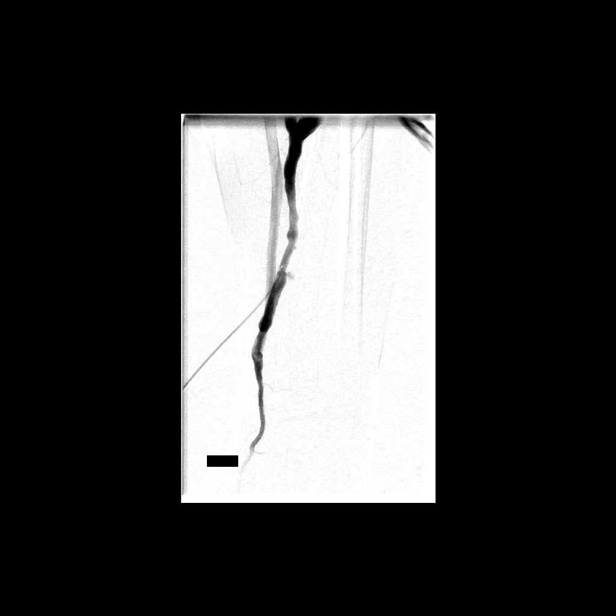

[Series 4: body 4 care · 2 of 9 slices shown (4 of 5)]
[im 3/9]
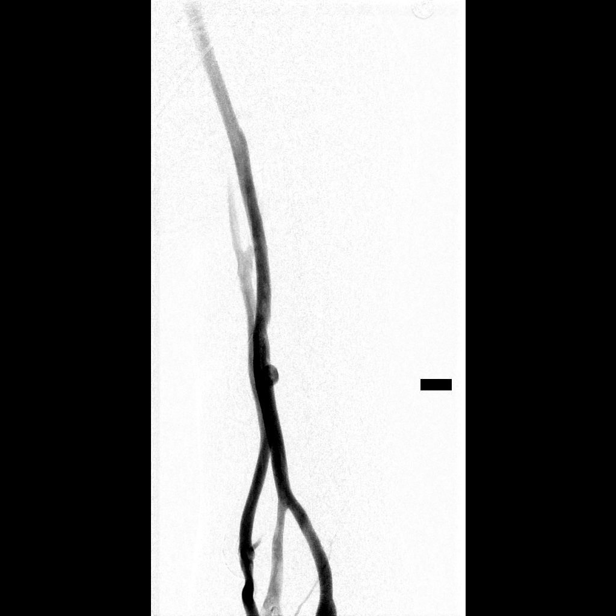
[im 7/9]
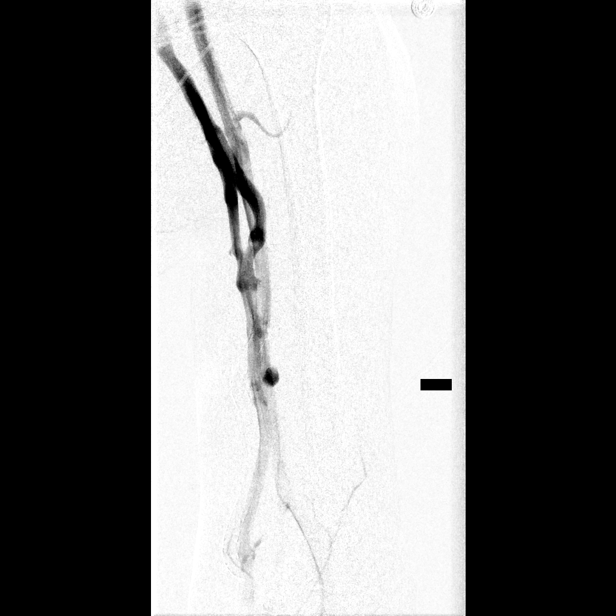

[Series 5: body 4 care · 4 of 11 slices shown (5 of 5)]
[im 1/11]
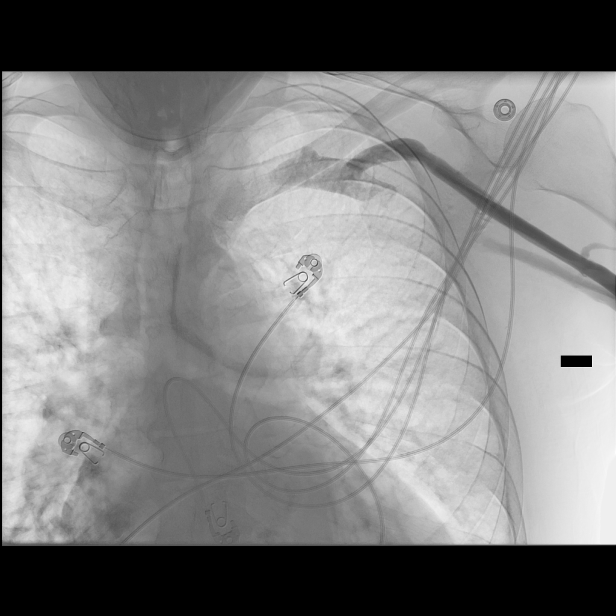
[im 3/11]
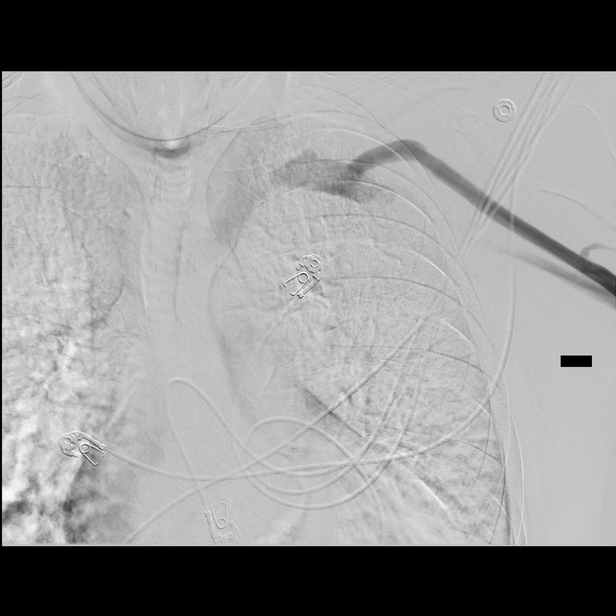
[im 7/11]
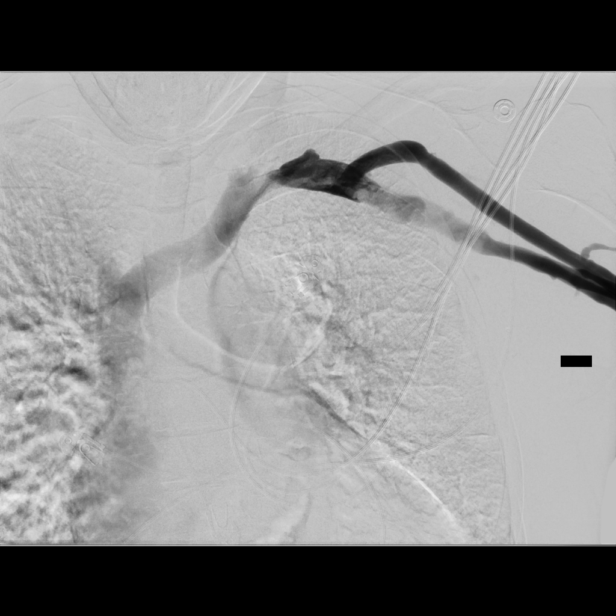
[im 11/11]
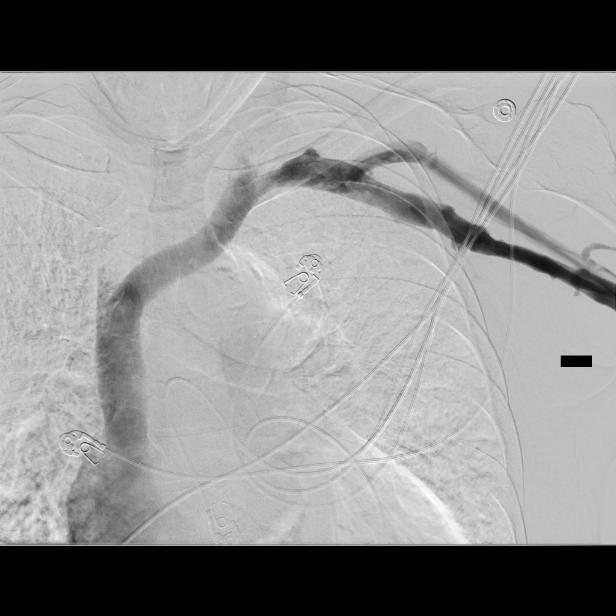

[14 of 24 positions shown; findings below may reference images not displayed]

MEDICATIONS:
None.

CONTRAST:  40mL OMNIPAQUE IOHEXOL 300 MG/ML  SOLN

ANESTHESIA/SEDATION:
None.

FLUOROSCOPY TIME:  0 minutes.  Eighteen seconds.  Ten mGy

COMPLICATIONS:
None immediate.

PROCEDURE:
The left arm dialysis graft was prepped with ChloraPrep in a sterile
fashion, and a sterile drape was applied covering the operative
field. A diagnostic shunt study was performed via 5 French
micropuncture sheath introduced into venous outflow. Venous drainage
was assessed to the level of the central veins in the chest.
Proximal shunt was studied by reflux maneuver with temporary
compression of venous outflow.

The angiocath was removed and hemostasis was achieved with manual
compression. A dressing was placed. The patient tolerated the
procedure well without immediate post procedural complication.
FINDINGS: Widely patent and briskly flowing left radiocephalic fistula from
the anastomosis through the puncture zone, venous outflow, and
central veins.
IMPRESSION: Widely patent left radiocephalic arteriovenous fistula. No
intervention performed.

ACCESS:
This access remains amenable to future percutaneous interventions as
clinically indicated.

## 2021-05-18 MED ORDER — SEVELAMER CARBONATE 800 MG PO TABS
800.0000 mg | ORAL_TABLET | Freq: Three times a day (TID) | ORAL | Status: DC
  Administered 2021-05-19: 800 mg via ORAL
  Filled 2021-05-18 (×2): qty 1

## 2021-05-18 MED ORDER — RENA-VITE PO TABS
1.0000 | ORAL_TABLET | Freq: Every day | ORAL | Status: DC
  Administered 2021-05-19: 1 via ORAL
  Filled 2021-05-18 (×2): qty 1

## 2021-05-18 MED ORDER — AMLODIPINE BESYLATE 10 MG PO TABS
10.0000 mg | ORAL_TABLET | Freq: Every day | ORAL | Status: DC
  Administered 2021-05-18 – 2021-05-19 (×2): 10 mg via ORAL
  Filled 2021-05-18: qty 2
  Filled 2021-05-18: qty 1

## 2021-05-18 MED ORDER — SODIUM CHLORIDE 0.9 % IV SOLN
1.0000 g | INTRAVENOUS | Status: DC
  Administered 2021-05-18: 1 g via INTRAVENOUS
  Filled 2021-05-18: qty 10

## 2021-05-18 MED ORDER — HYDROCODONE-ACETAMINOPHEN 5-325 MG PO TABS
2.0000 | ORAL_TABLET | Freq: Once | ORAL | Status: AC
  Administered 2021-05-18: 2 via ORAL
  Filled 2021-05-18: qty 2

## 2021-05-18 MED ORDER — ACETAMINOPHEN 325 MG PO TABS: 650.0000 mg | ORAL_TABLET | Freq: Four times a day (QID) | ORAL | Status: DC | PRN

## 2021-05-18 MED ORDER — GUAIFENESIN-DM 100-10 MG/5ML PO SYRP: 5.0000 mL | ORAL_SOLUTION | ORAL | Status: DC | PRN

## 2021-05-18 MED ORDER — SODIUM CHLORIDE 0.9 % IV SOLN
500.0000 mg | INTRAVENOUS | Status: DC
  Administered 2021-05-19: 500 mg via INTRAVENOUS
  Filled 2021-05-18 (×2): qty 500

## 2021-05-18 MED ORDER — ALBUTEROL SULFATE (2.5 MG/3ML) 0.083% IN NEBU: 3.0000 mL | INHALATION_SOLUTION | Freq: Two times a day (BID) | RESPIRATORY_TRACT | Status: DC | PRN

## 2021-05-18 MED ORDER — ACETAMINOPHEN 650 MG RE SUPP: 650.0000 mg | Freq: Four times a day (QID) | RECTAL | Status: DC | PRN

## 2021-05-18 MED ORDER — CINACALCET HCL 30 MG PO TABS
60.0000 mg | ORAL_TABLET | Freq: Every evening | ORAL | Status: DC
  Administered 2021-05-18: 60 mg via ORAL
  Filled 2021-05-18: qty 2

## 2021-05-18 MED ORDER — IOHEXOL 350 MG/ML SOLN
50.0000 mL | Freq: Once | INTRAVENOUS | Status: AC | PRN
  Administered 2021-05-18: 50 mL via INTRAVENOUS

## 2021-05-18 MED ORDER — FLUTICASONE FUROATE-VILANTEROL 100-25 MCG/INH IN AEPB
1.0000 | INHALATION_SPRAY | Freq: Every day | RESPIRATORY_TRACT | Status: DC
  Filled 2021-05-18: qty 28

## 2021-05-18 MED ORDER — HYDRALAZINE HCL 20 MG/ML IJ SOLN: 10.0000 mg | INTRAMUSCULAR | Status: DC | PRN

## 2021-05-18 MED ORDER — METOPROLOL TARTRATE 50 MG PO TABS
50.0000 mg | ORAL_TABLET | Freq: Two times a day (BID) | ORAL | Status: DC
  Administered 2021-05-18 – 2021-05-19 (×2): 50 mg via ORAL
  Filled 2021-05-18: qty 2
  Filled 2021-05-18: qty 1

## 2021-05-18 MED ORDER — PREGABALIN 25 MG PO CAPS
25.0000 mg | ORAL_CAPSULE | Freq: Three times a day (TID) | ORAL | Status: DC
  Administered 2021-05-19: 25 mg via ORAL
  Filled 2021-05-18: qty 1

## 2021-05-18 NOTE — ED Provider Notes (Signed)
[facility] EMERGENCY DEPARTMENT Provider Note   CSN: [id number] Arrival date & time: 05/18/21  1432     History No chief complaint on file.   Thomas Robinson is a 60 y.o. male with PMHx anemia of chronic disease, asthma, chronic diastolic heart failure, aortic stenosis, CVA back in 2017, AAA, GERD, hyperlipidemia, hypertension, history of cervical fusion, end-stage renal disease on dialysis M/W/F who presents for evaluation of fall.   Patient states that he has been experiencing malaise and fatigue over the course of the day.  He going to hang out on his porch with his dog when his left leg gave out and caused him to fall to the ground. He did not hit his head. No LOC. No preceding palpitations or chest pain.  He states that while laying on the ground, he became profoundly short of breath.  He is typically very orthopneic at baseline.  EMS was called, who found the patient to be hypoxic on room air.  Patient was subsequently transported to our emergency department for further evaluation.     On 04/25/2021, patient underwent L4-S1 laminectomy and fusion with Dr. Patrice Paradise.  He reports residual left leg weakness, which has consistently improved since surgery. No incontinence. No saddle anesthesia. No fevers or chills.      Past Medical History:  Diagnosis Date   Anaphylactic shock, unspecified, sequela 06/10/2019   Aortic stenosis    mild-moderate AS 12/2020 echo   Arthritis    Asthma, chronic, unspecified asthma severity, with acute exacerbation 10/23/2017   CAP (community acquired pneumonia) 10/23/2017   Carpal tunnel syndrome of right wrist 10/05/2018   Cataract    right - removed by surgery   Cerebral infarction due to thrombosis of cerebral artery (HCC)    Cervical disc herniation 01/04/2020   Chronic diastolic heart failure ([place]) 04/13/2018   Chronic low back pain 11/24/2019   Constipation    Cough    chronic cough   Dyspnea    Encounter for immunization  07/08/2017   ESRD on hemodialysis ([place]) 02/16/2018   Fall 06/04/2019   GERD (gastroesophageal reflux disease) 06/04/2019   GI bleeding 05/24/2017   Gram-negative sepsis, unspecified ([place]) 09/05/2017   History of fusion of cervical spine 03/26/2020   Hyperlipidemia    Hypertension    Hypokalemia 06/04/2017   Iron deficiency anemia, unspecified 09/09/2017   Left shoulder pain 11/11/2019   Lumbar radiculopathy 11/24/2019   Lung contusion 06/04/2019   LVH (left ventricular hypertrophy) due to hypertensive disease, with heart failure ([place]) 06/18/2020   Macrocytic anemia 05/24/2017   Moderate protein-calorie malnutrition ([place]) 06/06/2017   Myofascial pain syndrome 06/12/2020   Neuritis of right ulnar nerve 09/13/2018   Non-compliance with renal dialysis ([place]) 02/08/2020   Oxygen deficiency 12/30/2019   O2 sats on RA 87% at PAT appt    Pain in joint of right elbow 09/13/2018   Pulmonary hypertension ([place])    Renovascular hypertension 06/22/2020   Restless leg syndrome    Rib fractures 06/2019   Right   S/P cardiac cath 08/27/2020   normal coronary arteries.   Secondary hyperparathyroidism of renal origin ([place]) 06/04/2017   Thoracic ascending aortic aneurysm (HCC)    4.5 cm 06/04/19 CT   Ulnar neuropathy 10/05/2018   Volume overload 10/23/2017    Patient Active Problem List   Diagnosis Date Noted   Acute hypoxemic respiratory failure ([place]) 05/18/2021   Bronchitis 05/18/2021   Preoperative cardiovascular examination 03/06/2021   CHF (congestive heart failure) (  [place]) 02/17/2021   Cellulitis of arm, left 02/01/2021   Cellulitis of left lower limb 02/01/2021   Cellulitis, unspecified 02/01/2021   Left arm swelling 01/25/2021   Acute on chronic respiratory failure ([place]) 01/13/2021   Acute on chronic respiratory failure with hypoxia ([place]) 01/12/2021   Concussion with no loss of consciousness 01/12/2021   Laceration without foreign body of left forearm, initial encounter 01/12/2021    Age-related physical debility 09/20/2020   Allergy, unspecified, initial encounter 09/20/2020   Nausea 09/20/2020   Pain, unspecified 09/20/2020   Pruritus, unspecified 09/20/2020   S/P cardiac cath 08/27/2020   Acute encephalopathy 08/26/2020   Peripheral neuropathy 08/26/2020   Elevated troponin 08/25/2020   Hyperkalemia 08/05/2020   Arthritis    Asthma    Cataract    Cerebral infarction due to thrombosis of cerebral artery ([place])    History of CVA (cerebrovascular accident)    Chronic kidney disease    Dyspnea    Hyperlipidemia    Hypertension    Kidney failure    Restless leg syndrome    Degenerative lumbar spinal stenosis 06/25/8526   Diastolic dysfunction 78/24/2353   Renovascular hypertension 06/22/2020   Demand ischemia ([place]) 06/18/2020   LVH (left ventricular hypertrophy) due to hypertensive disease, with heart failure ([place]) 06/18/2020   Myofascial pain syndrome 06/12/2020   Encounter for removal of sutures 03/29/2020   History of fusion of cervical spine 03/26/2020   Non-compliance with renal dialysis ([place]) 02/08/2020   Acute pulmonary edema ([place]) 01/23/2020   Cervical disc herniation 01/04/2020   Tachycardia 01/04/2020   SOB (shortness of breath)    Hypoxia 12/30/2019   Chronic low back pain 11/24/2019   Degeneration of lumbar intervertebral disc 11/24/2019   Lumbar radiculopathy 11/24/2019   Left shoulder pain 11/11/2019   Anaphylactic shock, unspecified, sequela 06/10/2019   Rib fractures 06/05/2019   Fall at home, initial encounter 06/04/2019   Right rib fracture 06/04/2019   Lung contusion 06/04/2019   Acute respiratory failure with hypoxia ([place]) 06/04/2019   Anemia in ESRD (end-stage renal disease) ([place]) 06/04/2019   GERD without esophagitis 06/04/2019   Chronic diastolic (congestive) heart failure ([place]) 06/04/2019   Fall 06/04/2019   Thoracic ascending aortic aneurysm ([place]) 06/04/2019   Acute exacerbation of congestive heart failure ([place])  05/16/2019   Carpal tunnel syndrome of right wrist 10/05/2018   Ulnar neuropathy 10/05/2018   Neuritis of right ulnar nerve 09/13/2018   Pain in joint of right elbow 09/13/2018   Chronic diastolic heart failure ([place]) 04/13/2018   Acute on chronic diastolic CHF (congestive heart failure) ([place]) 04/13/2018   Atypical chest pain 02/16/2018   ESRD on dialysis ([place]) 02/16/2018   ESRD (end stage renal disease) on dialysis ([place]) 02/16/2018   CAP (community acquired pneumonia) 10/23/2017   Asthma, chronic, unspecified asthma severity, with acute exacerbation 10/23/2017   Respiratory failure, acute ([place]) 10/23/2017   Pulmonary hypertension ([place]) 10/23/2017   Iron deficiency anemia, unspecified 09/09/2017   Gram-negative sepsis, unspecified ([place]) 09/05/2017   Encounter for immunization 07/08/2017   Moderate protein-calorie malnutrition ([place]) 06/06/2017   Coagulation defect, unspecified ([place]) 06/04/2017   Hypokalemia 06/04/2017   Other hyperlipidemia 06/04/2017   Secondary hyperparathyroidism of renal origin ([place]) 06/04/2017   Macrocytic anemia 05/24/2017   Uremia 05/24/2017   Hypertensive urgency 05/24/2017   Acute kidney injury superimposed on CKD ([place]) 05/24/2017   CKD (chronic kidney disease), stage V ([place]) 05/24/2017   GI bleeding 05/24/2017   Anemia of chronic disease 05/24/2017  Stroke [facility]) 2018   AKI (acute kidney injury) ([place]) 04/10/2015   History of completed stroke    Essential hypertension 04/09/2015    Past Surgical History:  Procedure Laterality Date   A/V FISTULAGRAM N/A 01/01/2021   Procedure: A/V FISTULAGRAM;  Surgeon: Serafina Mitchell, MD;  Location: [facility] CV LAB;  Service: Cardiovascular;  Laterality: N/A;   A/V FISTULAGRAM N/A 01/15/2021   Procedure: A/V [id number];  Surgeon: Serafina Mitchell, MD;  Location: [facility];  Service: Cardiovascular;  Laterality: N/A;   ANTERIOR CERVICAL DECOMP/DISCECTOMY FUSION N/A 01/04/2020   Procedure: ANTERIOR CERVICAL  DECOMPRESSION/DISCECTOMY FUSION CERVICAL FIVE THROUGH SEVEN;  Surgeon: Melina Schools, MD;  Location: [facility];  Service: Orthopedics;  Laterality: N/A;  3 hrs   AV FISTULA PLACEMENT Left 05/28/2017   Procedure: LEFT ARM ARTERIOVENOUS (AV) FISTULA CREATION;  Surgeon: Conrad Otoe, MD;  Location: [facility];  Service: Vascular;  Laterality: Left;   AV FISTULA PLACEMENT Left 02/02/2021   Procedure: Debride left forearm, ligation of left upper arm Aretiovenous fistula;  Surgeon: Elam Dutch, MD;  Location: [facility] OR;  Service: Vascular;  Laterality: Left;   AV FISTULA PLACEMENT Right 04/04/2021   Procedure: RIGHT ARTERIOVENOUS (AV) FISTULA CREATION;  Surgeon: Serafina Mitchell, MD;  Location: [facility];  Service: Vascular;  Laterality: Right;   EYE SURGERY Right 06/02/2019   Cataract removed   FRACTURE SURGERY     INSERTION OF DIALYSIS CATHETER Right 02/02/2021   Procedure: INSERTION OF Right Internal jugular TUNNELED  DIALYSIS CATHETER;  Surgeon: Elam Dutch, MD;  Location: [facility];  Service: Vascular;  Laterality: Right;   IR DIALY SHUNT INTRO NEEDLE/INTRACATH INITIAL W/IMG LEFT Left 09/12/2020   IR FLUORO GUIDE CV LINE RIGHT  05/25/2017   IR US GUIDE VASC ACCESS LEFT  09/12/2020   IR US GUIDE VASC ACCESS RIGHT  05/25/2017   LIGATION OF ARTERIOVENOUS  FISTULA Left 01/10/2021   Procedure: LIGATION OF LEFT ARM RADIOCEPHALIC FISTULA;  Surgeon: Serafina Mitchell, MD;  Location: [facility];  Service: Vascular;  Laterality: Left;   PERIPHERAL VASCULAR BALLOON ANGIOPLASTY Left 01/15/2021   Procedure: PERIPHERAL VASCULAR BALLOON ANGIOPLASTY;  Surgeon: Serafina Mitchell, MD;  Location: [facility] CV LAB;  Service: Cardiovascular;  Laterality: Left;  AVF   REVISON OF ARTERIOVENOUS FISTULA Left 07/18/2019   Procedure: REVISION PLICATION OF RADIOCEPHALIC ARTERIOVENOUS FISTULA LEFT ARM;  Surgeon: Angelia Mould, MD;  Location: [facility] OR;  Service: Vascular;  Laterality: Left;   REVISON OF ARTERIOVENOUS FISTULA Left 01/10/2021    Procedure: CONVERSION TO BRACHIOCEPHALIC ARTERIOVENOUS FISTULA;  Surgeon: Serafina Mitchell, MD;  [facility];  Service: Vascular;  Laterality: Left;   RINOPLASTY         Family History  Problem Relation Age of Onset   Cancer Mother     Social History   Tobacco Use   Smoking status: Never   Smokeless tobacco: Never  Vaping Use   Vaping Use: Never used  Substance Use Topics   Alcohol use: Not Currently    Alcohol/week: 0.0 standard drinks    Comment: has a drink once in a while- 12/30/19   Drug use: No    Home Medications Prior to Admission medications   Medication Sig Start Date End Date Taking? Authorizing Provider  acetaminophen (TYLENOL) 500 MG tablet Take 500 mg by mouth every 6 (six) hours as needed for mild pain, moderate pain or headache.   Yes [provider]  albuterol (VENTOLIN HFA) 108 (  90 Base) MCG/ACT inhaler Inhale 2 puffs into the lungs 2 (two) times daily as needed for shortness of breath or wheezing.   Yes [provider]  AURYXIA 1 GM 210 MG(Fe) tablet Take 420 mg by mouth 3 (three) times daily with meals. 03/20/21  Yes [provider]  BREO ELLIPTA 100-25 MCG/INH AEPB Inhale 1 puff into the lungs daily. 06/26/20  Yes [provider]  cinacalcet (SENSIPAR) 60 MG tablet Take 60 mg by mouth every evening. 03/18/21  Yes [provider]  furosemide (LASIX) 80 MG tablet Take 80 mg by mouth See admin instructions. Take 80 mg by mouth daily as needed/as directed on non-dialysis days of Sun/Tues/Thurs/Sat   Yes [provider]  hydrALAZINE (APRESOLINE) 25 MG tablet Take 50 mg by mouth in the morning and at bedtime.   Yes [provider]  metoprolol tartrate (LOPRESSOR) 100 MG tablet Take 0.5 tablets (50 mg total) by mouth 2 (two) times daily. 01/15/21  Yes Alma Friendly, MD  multivitamin (RENA-VIT) TABS tablet Take 1 tablet by mouth daily. 01/12/18  Yes [provider]  pregabalin (LYRICA) 25  MG capsule Take 25 mg by mouth 3 (three) times daily with meals. 11/29/20  Yes [provider]  sevelamer carbonate (RENVELA) 800 MG tablet Take 800 mg by mouth 3 (three) times daily with meals. 11/07/19  Yes [provider]  traMADol (ULTRAM) 50 MG tablet Take 1 tablet (50 mg total) by mouth every 6 (six) hours as needed. Patient taking differently: Take 50 mg by mouth every 6 (six) hours as needed (for pain). 04/04/21 04/04/22 Yes Baglia, Corrina, PA-C  amLODipine (NORVASC) 10 MG tablet Take 10 mg by mouth daily.     [provider]  DIPHENHYDRAMINE HCL PO Take 25 mg by mouth as needed (allergies). Patient not taking: Reported on 05/18/2021 09/24/20 09/23/21  [provider]  doxercalciferol (HECTOROL) 0.5 MCG capsule Take 0.5 mcg by mouth 3 (three) times a week. 11/26/20 11/25/21  [provider]    Allergies    Vancomycin  Review of Systems   Review of Systems  Constitutional:  Positive for fatigue. Negative for chills and fever.  HENT:  Negative for ear pain and sore throat.   Eyes:  Negative for pain and visual disturbance.  Respiratory:  Positive for shortness of breath. Negative for cough.   Cardiovascular:  Negative for chest pain and palpitations.  Gastrointestinal:  Negative for abdominal pain and vomiting.  Genitourinary:  Negative for dysuria and hematuria.  Musculoskeletal:  Negative for arthralgias and back pain.  Skin:  Negative for color change and rash.  Neurological:  Positive for syncope and weakness. Negative for seizures.  All other systems reviewed and are negative.  Physical Exam Updated Vital Signs BP (!) 154/89   Pulse 69   Temp 98.1 F (36.7 C) (Oral)   Resp (!) 22   SpO2 98%   Physical Exam Vitals and nursing note reviewed.  Constitutional:      General: He is not in acute distress.    Appearance: He is well-developed.  HENT:     Head: Normocephalic and atraumatic.  Eyes:     Conjunctiva/sclera: Conjunctivae  normal.  Cardiovascular:     Rate and Rhythm: Normal rate and regular rhythm.     Heart sounds: No murmur heard. Pulmonary:     Effort: Pulmonary effort is normal. No respiratory distress.     Breath sounds: Rales present.  Abdominal:     Palpations: Abdomen  is soft.     Tenderness: There is no abdominal tenderness.  Musculoskeletal:     Cervical back: Normal range of motion and neck supple. No rigidity or tenderness.     Right lower leg: Edema present.     Comments: Significant asymmetric right lower extremity edema, pitting, 2+ DP pulses.  Skin:    General: Skin is warm and dry.  Neurological:     General: No focal deficit present.     Mental Status: He is alert.     Cranial Nerves: No cranial nerve deficit.     Sensory: No sensory deficit.     Motor: No weakness.    ED Results / Procedures / Treatments   Labs (all labs ordered are listed, but only abnormal results are displayed) Labs Reviewed  BASIC METABOLIC PANEL - Abnormal; Notable for the following components:      Result Value   BUN 27 (*)    Creatinine, Ser 4.90 (*)    Calcium 8.7 (*)    GFR, Estimated 13 (*)    All other components within normal limits  CBC - Abnormal; Notable for the following components:   WBC 2.9 (*)    RBC 2.95 (*)    Hemoglobin 10.1 (*)    HCT 31.3 (*)    MCV 106.1 (*)    MCH 34.2 (*)    Platelets 94 (*)    All other components within normal limits  HEPATIC FUNCTION PANEL - Abnormal; Notable for the following components:   Albumin 3.1 (*)    Bilirubin, Direct 0.3 (*)    All other components within normal limits  TROPONIN I (HIGH SENSITIVITY) - Abnormal; Notable for the following components:   Troponin I (High Sensitivity) 65 (*)    All other components within normal limits  TROPONIN I (HIGH SENSITIVITY) - Abnormal; Notable for the following components:   Troponin I (High Sensitivity) 65 (*)    All other components within normal limits  RESP PANEL BY RT-PCR (FLU A&B, COVID) [id number]   CULTURE, BLOOD (ROUTINE X 2)  CULTURE, BLOOD (ROUTINE X 2)  CBC  PROCALCITONIN  MAGNESIUM  CBG MONITORING, ED    EKG EKG Interpretation  Date/Time:  Saturday May 18 2021 14:32:58 EDT Ventricular Rate:  73 PR Interval:  186 QRS Duration: 92 QT Interval:  454 QTC Calculation: 500 R Axis:   58 Text Interpretation: Normal sinus rhythm Minimal voltage criteria for LVH, may be normal variant ( Cornell product ) new Nonspecific T wave abnormality new Prolonged QT Confirmed by Blanchie Dessert [phone]) on 05/18/2021 3:32:28 PM  Radiology DG Chest 2 View  Result Date: 05/18/2021 CLINICAL DATA:  Chest pain, weakness, hypoxia EXAM: CHEST - 2 VIEW COMPARISON:  Chest x-ray 03/29/2021, CT chest 05/23/2020 FINDINGS: Right chest wall dialysis catheter with tip overlying the expected region of the superior cavoatrial junction. Persistent cardiomegaly. The heart and mediastinal contours are unchanged. Aortic calcification. Slight prominence of the hilar vasculature. No focal consolidation. Slightly increased interstitial markings, Kerley B lines. Blunting of bilateral costophrenic angles with possible trace pleural effusions. No pneumothorax. No acute osseous abnormality. IMPRESSION: Mild pulmonary edema.  Trace pleural effusions. Electronically Signed   By: Iven Finn M.D.   On: 05/18/2021 16:03   CT Angio Chest PE W and/or Wo Contrast  Result Date: 05/18/2021 CLINICAL DATA:  PE suspected EXAM: CT ANGIOGRAPHY CHEST WITH CONTRAST TECHNIQUE: Multidetector CT imaging of the chest was performed using the standard protocol during bolus administration of intravenous contrast. Multiplanar CT  image reconstructions and MIPs were obtained to evaluate the vascular anatomy. CONTRAST:  33mL OMNIPAQUE IOHEXOL 350 MG/ML SOLN COMPARISON:  None. FINDINGS: Cardiovascular: Satisfactory opacification of the pulmonary arteries to the segmental level. No evidence of pulmonary embolism. Cardiomegaly. Three-vessel  coronary artery calcifications. No pericardial effusion. Right neck large bore multi lumen vascular catheter. Enlargement of the tubular ascending thoracic aorta, measuring 4.6 x 4.5 cm. Mediastinum/Nodes: No enlarged mediastinal, hilar, or axillary lymph nodes. Thyroid gland, trachea, and esophagus demonstrate no significant findings. Lungs/Pleura: Diffuse bilateral bronchial wall thickening. Trace bilateral pleural effusions associated atelectasis or consolidation. Upper Abdomen: No acute abnormality. Musculoskeletal: No chest wall abnormality. No acute or significant osseous findings. Renal osteodystrophy. Review of the MIP images confirms the above findings. IMPRESSION: 1. Negative examination for pulmonary embolism. 2. Diffuse bilateral bronchial wall thickening, consistent with nonspecific infectious or inflammatory bronchitis. 3. Trace bilateral pleural effusions and associated atelectasis or consolidation. 4. Cardiomegaly and coronary artery disease. 5. Enlargement of the tubular ascending thoracic aorta, measuring 4.6 x 4.5 cm. Ascending thoracic aortic aneurysm. Recommend semi-annual imaging followup by CTA or MRA and referral to cardiothoracic surgery if not already obtained. This recommendation follows 2010 ACCF/AHA/AATS/ACR/ASA/SCA/SCAI/SIR/STS/SVM Guidelines for the Diagnosis and Management of Patients With Thoracic Aortic Disease. Circulation. 2010; 121: O878-M767. Aortic aneurysm NOS (ICD10-I71.9) Electronically Signed   By: Eddie Candle M.D.   On: 05/18/2021 19:40   VAS [place] LOWER EXTREMITY VENOUS (DVT) (ONLY [facility] & [facility])  Result Date: 05/18/2021  Lower Venous DVT Study Patient Name:  MARKES SHATSWELL  Date of Exam:   05/18/2021 Medical Rec #: [id number]         Accession #:    [id number] Date of Birth: July 21, 1961         Patient Gender: M Patient Age:   77 years Exam Location:  [facility] Procedure:      VAS [place] LOWER EXTREMITY VENOUS (DVT) Referring Phys: Blanchie Dessert  --------------------------------------------------------------------------------  Indications: Swelling.  Comparison Study: No prior study Performing Technologist: Sharion Dove RVS  Examination Guidelines: A complete evaluation includes B-mode imaging, spectral Doppler, color Doppler, and power Doppler as needed of all accessible portions of each vessel. Bilateral testing is considered an integral part of a complete examination. Limited examinations for reoccurring indications may be performed as noted. The reflux portion of the exam is performed with the patient in reverse Trendelenburg.  +---------+---------------+---------+-----------+----------+------------------+ RIGHT    CompressibilityPhasicitySpontaneityPropertiesThrombus Aging     +---------+---------------+---------+-----------+----------+------------------+ CFV      Full                                         pulsatile waveform +---------+---------------+---------+-----------+----------+------------------+ SFJ      Full                                                            +---------+---------------+---------+-----------+----------+------------------+ FV Prox  Full                                                            +---------+---------------+---------+-----------+----------+------------------+  FV Mid   Full                                                            +---------+---------------+---------+-----------+----------+------------------+ FV DistalFull                                                            +---------+---------------+---------+-----------+----------+------------------+ PFV      Full                                                            +---------+---------------+---------+-----------+----------+------------------+ POP      Full                                         pulsatile waveform  +---------+---------------+---------+-----------+----------+------------------+ PTV      Full                                                            +---------+---------------+---------+-----------+----------+------------------+ PERO     Full                                                            +---------+---------------+---------+-----------+----------+------------------+   +----+---------------+---------+-----------+----------+------------------+ LEFTCompressibilityPhasicitySpontaneityPropertiesThrombus Aging     +----+---------------+---------+-----------+----------+------------------+ CFV Full                                         pulsatile waveform +----+---------------+---------+-----------+----------+------------------+    Summary: RIGHT: - There is no evidence of deep vein thrombosis in the lower extremity.  pulsatile waveforms suggestive of fluid overload  LEFT: - No evidence of common femoral vein obstruction. Pulsatile waveforms suggestive of fluid overload.  *See table(s) above for measurements and observations.    Preliminary     Procedures Procedures   Medications Ordered in ED Medications  amLODipine (NORVASC) tablet 10 mg (10 mg Oral Given 05/18/21 2351)  metoprolol tartrate (LOPRESSOR) tablet 50 mg (50 mg Oral Given 05/18/21 2344)  cinacalcet (SENSIPAR) tablet 60 mg (60 mg Oral Given 05/18/21 2344)  sevelamer carbonate (RENVELA) tablet 800 mg (has no administration in time range)  pregabalin (LYRICA) capsule 25 mg (has no administration in time range)  multivitamin (RENA-VIT) tablet 1 tablet (has no administration in time range)  albuterol (PROVENTIL) (2.5 MG/3ML) 0.083% nebulizer solution 3 mL (has no administration in time range)  fluticasone furoate-vilanterol (BREO ELLIPTA) 100-25 MCG/INH 1 puff (has no  administration in time range)  acetaminophen (TYLENOL) tablet 650 mg (has no administration in time range)    Or  acetaminophen (TYLENOL)  suppository 650 mg (has no administration in time range)  cefTRIAXone (ROCEPHIN) 1 g in sodium chloride 0.9 % 100 mL IVPB (1 g Intravenous New Bag/Given 05/18/21 2342)  azithromycin (ZITHROMAX) 500 mg in sodium chloride 0.9 % 250 mL IVPB (500 mg Intravenous New Bag/Given 05/19/21 0039)  guaiFENesin-dextromethorphan (ROBITUSSIN DM) 100-10 MG/5ML syrup 5 mL (has no administration in time range)  hydrALAZINE (APRESOLINE) injection 10 mg (has no administration in time range)  HYDROcodone-acetaminophen (NORCO/VICODIN) 5-325 MG per tablet 2 tablet (2 tablets Oral Given 05/18/21 1840)  iohexol (OMNIPAQUE) 350 MG/ML injection 50 mL (50 mLs Intravenous Contrast Given 05/18/21 1925)    ED Course  I have reviewed the triage vital signs and the nursing notes.  Pertinent labs & imaging results that were available during my care of the patient were reviewed by me and considered in my medical decision making (see chart for details).  Clinical Course as of 05/19/21 1314  Sat May 18, 2021  2002 Stable noncritical anemia.  CMP and hepatic function normal.  COVID-19 test negative. Troponin elevation is stable compared to priors. CXR  [CH]    Clinical Course User Index [CH] Violet Baldy, MD   MDM Rules/Calculators/A&P                           59 y.o. male with past medical history as above who presents for evaluation of fall and shortness of breath. Patient states that fall was very mild in mechanism and he does not have any residual pain; however, he did feel very short of breath afterwards.  Afebrile and hemodynamically stable.  Patient is hypoxic on room air, requiring 1 L nasal cannula.  Exam as detailed above. Stable noncritical anemia.  CMP and hepatic function normal.  COVID-19 test negative. Troponin elevation is stable compared to priors.  Chest x-ray with pulmonary edema and trace pleural effusions, consistent with volume overload.  DVT study without evidence of DVT.  CTA chest without evidence of  acute pulmonary embolism but re-demonstrates signs of volume overload.  Given patient's persistent oxygen requirement in the context of volume overload, I believe the patient will require hospital admission.  Patient was excepted for admission by hospitalist.  Final Clinical Impression(s) / ED Diagnoses Final diagnoses:  None    Rx / DC Orders ED Discharge Orders     None        Violet Baldy, MD 05/19/21 1331    Blanchie Dessert, MD 05/21/21 1255

## 2021-05-18 NOTE — ED Triage Notes (Addendum)
Pt to triage via [facility] from home.  Reports generalized weakness.  His legs "gave out" while in doorway at his house.  Episode of SOB and chest pain.  States he intermittently has SOB and Chest pain.  Denies chest pain at present.  Reports back pain.  Back surgery 2 weeks ago.    O2 sats 83% on room air and 98% on 4 liters.  Pt states he didn't pass out.   Pt pale.

## 2021-05-18 NOTE — Progress Notes (Signed)
VASCULAR LAB    Right lower extremity venous duplex has been performed.  See CV proc for preliminary results.   Reniah Cottingham, RVT 05/18/2021, 5:12 PM

## 2021-05-18 NOTE — H&P (Signed)
History and Physical    Thomas Robinson CBU:[id number] DOB: March 17, 1961 DOA: 05/18/2021  PCP: [name], PA Patient coming from: Home  Chief Complaint: Shortness of breath  HPI: Thomas Robinson is a 60 y.o. male with medical history significant of ESRD on HD MWF, chronic diastolic CHF, ascending thoracic aortic aneurysm, GERD, RLS, anemia, hypertension, mild to moderate aortic stenosis, asthma, hyperlipidemia, pulmonary hypertension, recent lumbar laminectomy presented to the ED complaining of shortness of breath, cough, chest pain, and right leg swelling.  He had a fall at home after his left leg gave out.  Oxygen saturation 83% on room air with EMS, placed on supplemental oxygen.  In the ED, patient had intact strength and pulses in his bilateral lower extremities.  His blood pressure was elevated with systolic up to 032Z.  Labs notable for WBC 2.9, hemoglobin 10.1 (stable), platelet count 94k.  Potassium and bicarb normal.  EKG showing new T wave abnormality in anterior leads.  High-sensitivity troponin mildly elevated but flat (65>65) and consistent with baseline.  COVID and influenza PCR negative.  Right lower extremity Doppler negative for DVT.  CT angiogram chest negative for PE.  Showing diffuse bilateral bronchial wall thickening consistent with nonspecific infectious or inflammatory bronchitis.  Trace bilateral pleural effusions and associated atelectasis or consolidation.  Ascending thoracic aortic aneurysm measuring 4.6 x 4.5 cm. Patient was given Norco.  Patient states he had back surgery done a few weeks ago.  He was using a walker to take his dog out for a walk and somehow his left leg gave out and he fell backwards.  He did not injure his head or lose consciousness.  Not endorsing any pain.  Reports orthopnea and nonproductive cough, unclear when symptoms started.  Denies fevers or chest pain.  His last dialysis was yesterday.  Also reporting right lower extremity edema, unclear  when this started.  Denies any injuries to his leg.  Review of Systems:  All systems reviewed and apart from history of presenting illness, are negative.  Past Medical History:  Diagnosis Date   Anaphylactic shock, unspecified, sequela 06/10/2019   Aortic stenosis    mild-moderate AS 12/2020 echo   Arthritis    Asthma, chronic, unspecified asthma severity, with acute exacerbation 10/23/2017   CAP (community acquired pneumonia) 10/23/2017   Carpal tunnel syndrome of right wrist 10/05/2018   Cataract    right - removed by surgery   Cerebral infarction due to thrombosis of cerebral artery (HCC)    Cervical disc herniation 01/04/2020   Chronic diastolic heart failure ([place]) 04/13/2018   Chronic low back pain 11/24/2019   Constipation    Cough    chronic cough   Dyspnea    Encounter for immunization 07/08/2017   ESRD on hemodialysis ([place]) 02/16/2018   Fall 06/04/2019   GERD (gastroesophageal reflux disease) 06/04/2019   GI bleeding 05/24/2017   Gram-negative sepsis, unspecified ([place]) 09/05/2017   History of fusion of cervical spine 03/26/2020   Hyperlipidemia    Hypertension    Hypokalemia 06/04/2017   Iron deficiency anemia, unspecified 09/09/2017   Left shoulder pain 11/11/2019   Lumbar radiculopathy 11/24/2019   Lung contusion 06/04/2019   LVH (left ventricular hypertrophy) due to hypertensive disease, with heart failure ([place]) 06/18/2020   Macrocytic anemia 05/24/2017   Moderate protein-calorie malnutrition ([place]) 06/06/2017   Myofascial pain syndrome 06/12/2020   Neuritis of right ulnar nerve 09/13/2018   Non-compliance with renal dialysis ([place]) 02/08/2020   Oxygen deficiency 12/30/2019   O2  sats on RA 87% at PAT appt    Pain in joint of right elbow 09/13/2018   Pulmonary hypertension ([place])    Renovascular hypertension 06/22/2020   Restless leg syndrome    Rib fractures 06/2019   Right   S/P cardiac cath 08/27/2020   normal coronary arteries.   Secondary  hyperparathyroidism of renal origin ([place]) 06/04/2017   Thoracic ascending aortic aneurysm (HCC)    4.5 cm 06/04/19 CT   Ulnar neuropathy 10/05/2018   Volume overload 10/23/2017    Past Surgical History:  Procedure Laterality Date   A/V FISTULAGRAM N/A 01/01/2021   Procedure: A/V FISTULAGRAM;  Surgeon: Serafina Mitchell, MD;  Location: [place] [facility];  Service: Cardiovascular;  Laterality: N/A;   A/V FISTULAGRAM N/A 01/15/2021   Procedure: A/V [id number];  Surgeon: Serafina Mitchell, MD;  Location: [place] [facility];  Service: Cardiovascular;  Laterality: N/A;   ANTERIOR CERVICAL DECOMP/DISCECTOMY FUSION N/A 01/04/2020   Procedure: ANTERIOR CERVICAL DECOMPRESSION/DISCECTOMY FUSION CERVICAL FIVE THROUGH SEVEN;  Surgeon: Melina Schools, MD;  Location: [place];  Service: Orthopedics;  Laterality: N/A;  3 hrs   AV FISTULA PLACEMENT Left 05/28/2017   Procedure: LEFT ARM ARTERIOVENOUS (AV) FISTULA CREATION;  Surgeon: Conrad Sidney, MD;  Location: [place];  Service: Vascular;  Laterality: Left;   AV FISTULA PLACEMENT Left 02/02/2021   Procedure: Debride left forearm, ligation of left upper arm Aretiovenous fistula;  Surgeon: Elam Dutch, MD;  [facility];  Service: Vascular;  Laterality: Left;   AV FISTULA PLACEMENT Right 04/04/2021   Procedure: RIGHT ARTERIOVENOUS (AV) FISTULA CREATION;  Surgeon: Serafina Mitchell, MD;  Location: [facility];  Service: Vascular;  Laterality: Right;   EYE SURGERY Right 06/02/2019   Cataract removed   FRACTURE SURGERY     INSERTION OF DIALYSIS CATHETER Right 02/02/2021   Procedure: INSERTION OF Right Internal jugular TUNNELED  DIALYSIS CATHETER;  Surgeon: Elam Dutch, MD;  Location: [place];  Service: Vascular;  Laterality: Right;   IR DIALY SHUNT INTRO NEEDLE/INTRACATH INITIAL W/IMG LEFT Left 09/12/2020   IR FLUORO GUIDE CV LINE RIGHT  05/25/2017   IR US GUIDE VASC ACCESS LEFT  09/12/2020   IR US GUIDE VASC ACCESS RIGHT  05/25/2017   LIGATION OF ARTERIOVENOUS  FISTULA  Left 01/10/2021   Procedure: LIGATION OF LEFT ARM RADIOCEPHALIC FISTULA;  Surgeon: Serafina Mitchell, MD;  Location: [place];  Service: Vascular;  Laterality: Left;   PERIPHERAL VASCULAR BALLOON ANGIOPLASTY Left 01/15/2021   Procedure: PERIPHERAL VASCULAR BALLOON ANGIOPLASTY;  Surgeon: Serafina Mitchell, MD;  Location: [place] [facility];  Service: Cardiovascular;  Laterality: Left;  AVF   REVISON OF ARTERIOVENOUS FISTULA Left 07/18/2019   Procedure: REVISION PLICATION OF RADIOCEPHALIC ARTERIOVENOUS FISTULA LEFT ARM;  Surgeon: Angelia Mould, MD;  Location: [place];  Service: Vascular;  Laterality: Left;   REVISON OF ARTERIOVENOUS FISTULA Left 01/10/2021   Procedure: CONVERSION TO BRACHIOCEPHALIC ARTERIOVENOUS FISTULA;  Surgeon: Serafina Mitchell, MD;  Location: [place];  Service: Vascular;  Laterality: Left;   RINOPLASTY       reports that he has never smoked. He has never used smokeless tobacco. He reports that he does not currently use alcohol. He reports that he does not use drugs.  Allergies  Allergen Reactions   Vancomycin Shortness Of Breath and Itching    Family History  Problem Relation Age of Onset   Cancer Mother     Prior to Admission medications   Medication Sig  Start Date End Date Taking? Authorizing Provider  acetaminophen (TYLENOL) 500 MG tablet Take 500 mg by mouth every 6 (six) hours as needed for mild pain, moderate pain or headache.   Yes [provider]  albuterol (VENTOLIN HFA) 108 (90 Base) MCG/ACT inhaler Inhale 2 puffs into the lungs 2 (two) times daily as needed for shortness of breath or wheezing.   Yes [provider]  AURYXIA 1 GM 210 MG(Fe) tablet Take 420 mg by mouth 3 (three) times daily with meals. 03/20/21  Yes [provider]  BREO ELLIPTA 100-25 MCG/INH AEPB Inhale 1 puff into the lungs daily. 06/26/20  Yes [provider]  cinacalcet (SENSIPAR) 60 MG tablet Take 60 mg by mouth every evening. 03/18/21  Yes [provider]  furosemide (LASIX) 80 MG tablet Take 80 mg by mouth See admin instructions. Take 80 mg by mouth daily as needed/as directed on non-dialysis days of Sun/Tues/Thurs/Sat   Yes [provider]  hydrALAZINE (APRESOLINE) 25 MG tablet Take 50 mg by mouth in the morning and at bedtime.   Yes [provider]  metoprolol tartrate (LOPRESSOR) 100 MG tablet Take 0.5 tablets (50 mg total) by mouth 2 (two) times daily. 01/15/21  Yes Alma Friendly, MD  multivitamin (RENA-VIT) TABS tablet Take 1 tablet by mouth daily. 01/12/18  Yes [provider]  pregabalin (LYRICA) 25 MG capsule Take 25 mg by mouth 3 (three) times daily with meals. 11/29/20  Yes [provider]  sevelamer carbonate (RENVELA) 800 MG tablet Take 800 mg by mouth 3 (three) times daily with meals. 11/07/19  Yes [provider]  traMADol (ULTRAM) 50 MG tablet Take 1 tablet (50 mg total) by mouth every 6 (six) hours as needed. Patient taking differently: Take 50 mg by mouth every 6 (six) hours as needed (for pain). 04/04/21 04/04/22 Yes Baglia, Corrina, PA-C  amLODipine (NORVASC) 10 MG tablet Take 10 mg by mouth daily.     [provider]  DIPHENHYDRAMINE HCL PO Take 25 mg by mouth as needed (allergies). Patient not taking: Reported on 05/18/2021 09/24/20 09/23/21  [provider]  doxercalciferol (HECTOROL) 0.5 MCG capsule Take 0.5 mcg by mouth 3 (three) times a week. 11/26/20 11/25/21  [provider]    Physical Exam: Vitals:   05/18/21 1817 05/18/21 1830 05/18/21 1842 05/18/21 1900  BP: (!) 184/95 (!) 185/90  (!) 185/100  Pulse: 71 61  73  Resp: (!) 22 (!) 25  15  Temp:      TempSrc:      SpO2: 92% 93% 94% 93%    Physical Exam Constitutional:      General: He is not in acute distress. HENT:     Head: Normocephalic and atraumatic.  Eyes:     Extraocular Movements: Extraocular movements intact.     Conjunctiva/sclera: Conjunctivae normal.   Cardiovascular:     Rate and Rhythm: Normal rate and regular rhythm.     Pulses: Normal pulses.  Pulmonary:     Effort: Pulmonary effort is normal. No respiratory distress.     Breath sounds: No wheezing or rales.     Comments: Decreased breath sounds at the bases Abdominal:     General: Bowel sounds are normal. There is no distension.     Palpations: Abdomen is soft.     Tenderness: There is no abdominal tenderness.  Musculoskeletal:     Cervical back: Normal range of motion and neck supple.     Right lower leg:  Edema present.  Skin:    General: Skin is warm and dry.  Neurological:     General: No focal deficit present.     Mental Status: He is alert and oriented to person, place, and time.     Motor: No weakness.     Comments: Strength intact in bilateral lower extremities     Labs on Admission: I have personally reviewed following labs and imaging studies  CBC: Recent Labs  Lab 05/18/21 1452  WBC 2.9*  HGB 10.1*  HCT 31.3*  MCV 106.1*  PLT 94*   Basic Metabolic Panel: Recent Labs  Lab 05/18/21 1452  NA 138  K 4.1  CL 98  CO2 26  GLUCOSE 81  BUN 27*  CREATININE 4.90*  CALCIUM 8.7*   GFR: CrCl cannot be calculated (Unknown ideal weight.). Liver Function Tests: Recent Labs  Lab 05/18/21 1636  AST 28  ALT 9  ALKPHOS 102  BILITOT 0.9  PROT 6.5  ALBUMIN 3.1*   No results for input(s): LIPASE, AMYLASE in the last 168 hours. No results for input(s): AMMONIA in the last 168 hours. Coagulation Profile: No results for input(s): INR, PROTIME in the last 168 hours. Cardiac Enzymes: No results for input(s): CKTOTAL, CKMB, CKMBINDEX, TROPONINI in the last 168 hours. BNP (last 3 results) No results for input(s): PROBNP in the last 8760 hours. HbA1C: No results for input(s): HGBA1C in the last 72 hours. CBG: Recent Labs  Lab 05/18/21 1443  GLUCAP 79   Lipid Profile: No results for input(s): CHOL, HDL, LDLCALC, TRIG, CHOLHDL, LDLDIRECT in the last 72  hours. Thyroid Function Tests: No results for input(s): TSH, T4TOTAL, FREET4, T3FREE, THYROIDAB in the last 72 hours. Anemia Panel: No results for input(s): VITAMINB12, FOLATE, FERRITIN, TIBC, IRON, RETICCTPCT in the last 72 hours. Urine analysis:    Component Value Date/Time   COLORURINE YELLOW 05/24/2017 1729   APPEARANCEUR HAZY (A) 05/24/2017 1729   LABSPEC 1.011 05/24/2017 1729   PHURINE 5.0 05/24/2017 1729   GLUCOSEU 50 (A) 05/24/2017 1729   HGBUR SMALL (A) 05/24/2017 1729   BILIRUBINUR NEGATIVE 05/24/2017 1729   KETONESUR NEGATIVE 05/24/2017 1729   PROTEINUR 100 (A) 05/24/2017 1729   UROBILINOGEN 0.2 04/09/2015 1527   NITRITE NEGATIVE 05/24/2017 1729   LEUKOCYTESUR TRACE (A) 05/24/2017 1729    Radiological Exams on Admission: DG Chest 2 View  Result Date: 05/18/2021 CLINICAL DATA:  Chest pain, weakness, hypoxia EXAM: CHEST - 2 VIEW COMPARISON:  Chest x-ray 03/29/2021, CT chest 05/23/2020 FINDINGS: Right chest wall dialysis catheter with tip overlying the expected region of the superior cavoatrial junction. Persistent cardiomegaly. The heart and mediastinal contours are unchanged. Aortic calcification. Slight prominence of the hilar vasculature. No focal consolidation. Slightly increased interstitial markings, Kerley B lines. Blunting of bilateral costophrenic angles with possible trace pleural effusions. No pneumothorax. No acute osseous abnormality. IMPRESSION: Mild pulmonary edema.  Trace pleural effusions. Electronically Signed   By: Iven Finn M.D.   On: 05/18/2021 16:03   CT Angio Chest PE W and/or Wo Contrast  Result Date: 05/18/2021 CLINICAL DATA:  PE suspected EXAM: CT ANGIOGRAPHY CHEST WITH CONTRAST TECHNIQUE: Multidetector CT imaging of the chest was performed using the standard protocol during bolus administration of intravenous contrast. Multiplanar CT image reconstructions and MIPs were obtained to evaluate the vascular anatomy. CONTRAST:  22mL OMNIPAQUE IOHEXOL  350 MG/ML SOLN COMPARISON:  None. FINDINGS: Cardiovascular: Satisfactory opacification of the pulmonary arteries to the segmental level. No evidence of pulmonary embolism. Cardiomegaly. Three-vessel coronary  artery calcifications. No pericardial effusion. Right neck large bore multi lumen vascular catheter. Enlargement of the tubular ascending thoracic aorta, measuring 4.6 x 4.5 cm. Mediastinum/Nodes: No enlarged mediastinal, hilar, or axillary lymph nodes. Thyroid gland, trachea, and esophagus demonstrate no significant findings. Lungs/Pleura: Diffuse bilateral bronchial wall thickening. Trace bilateral pleural effusions associated atelectasis or consolidation. Upper Abdomen: No acute abnormality. Musculoskeletal: No chest wall abnormality. No acute or significant osseous findings. Renal osteodystrophy. Review of the MIP images confirms the above findings. IMPRESSION: 1. Negative examination for pulmonary embolism. 2. Diffuse bilateral bronchial wall thickening, consistent with nonspecific infectious or inflammatory bronchitis. 3. Trace bilateral pleural effusions and associated atelectasis or consolidation. 4. Cardiomegaly and coronary artery disease. 5. Enlargement of the tubular ascending thoracic aorta, measuring 4.6 x 4.5 cm. Ascending thoracic aortic aneurysm. Recommend semi-annual imaging followup by CTA or MRA and referral to cardiothoracic surgery if not already obtained. This recommendation follows 2010 ACCF/AHA/AATS/ACR/ASA/SCA/SCAI/SIR/STS/SVM Guidelines for the Diagnosis and Management of Patients With Thoracic Aortic Disease. Circulation. 2010; 121: X323-F573. Aortic aneurysm NOS (ICD10-I71.9) Electronically Signed   By: Eddie Candle M.D.   On: 05/18/2021 19:40   VAS [place] LOWER EXTREMITY VENOUS (DVT) (ONLY [facility] & [facility])  Result Date: 05/18/2021  Lower Venous DVT Study Patient Name:  ROLF FELLS  Date of Exam:   05/18/2021 Medical Rec #: [id number]         Accession #:    [id number] Date of Birth:  21-Mar-1961         Patient Gender: M Patient Age:   64 years Exam Location:  [facility] Procedure:      VAS [place] LOWER EXTREMITY VENOUS (DVT) Referring Phys: Blanchie Dessert --------------------------------------------------------------------------------  Indications: Swelling.  Comparison Study: No prior study Performing Technologist: Sharion Dove RVS  Examination Guidelines: A complete evaluation includes B-mode imaging, spectral Doppler, color Doppler, and power Doppler as needed of all accessible portions of each vessel. Bilateral testing is considered an integral part of a complete examination. Limited examinations for reoccurring indications may be performed as noted. The reflux portion of the exam is performed with the patient in reverse Trendelenburg.  +---------+---------------+---------+-----------+----------+------------------+ RIGHT    CompressibilityPhasicitySpontaneityPropertiesThrombus Aging     +---------+---------------+---------+-----------+----------+------------------+ CFV      Full                                         pulsatile waveform +---------+---------------+---------+-----------+----------+------------------+ SFJ      Full                                                            +---------+---------------+---------+-----------+----------+------------------+ FV Prox  Full                                                            +---------+---------------+---------+-----------+----------+------------------+ FV Mid   Full                                                            +---------+---------------+---------+-----------+----------+------------------+  FV DistalFull                                                            +---------+---------------+---------+-----------+----------+------------------+ PFV      Full                                                             +---------+---------------+---------+-----------+----------+------------------+ POP      Full                                         pulsatile waveform +---------+---------------+---------+-----------+----------+------------------+ PTV      Full                                                            +---------+---------------+---------+-----------+----------+------------------+ PERO     Full                                                            +---------+---------------+---------+-----------+----------+------------------+   +----+---------------+---------+-----------+----------+------------------+ LEFTCompressibilityPhasicitySpontaneityPropertiesThrombus Aging     +----+---------------+---------+-----------+----------+------------------+ CFV Full                                         pulsatile waveform +----+---------------+---------+-----------+----------+------------------+    Summary: RIGHT: - There is no evidence of deep vein thrombosis in the lower extremity.  pulsatile waveforms suggestive of fluid overload  LEFT: - No evidence of common femoral vein obstruction. Pulsatile waveforms suggestive of fluid overload.  *See table(s) above for measurements and observations.    Preliminary     EKG: Independently reviewed.  Sinus rhythm, T wave abnormality in anterior leads new since prior tracing.  QTc 500.  Assessment/Plan Principal Problem:   Acute hypoxemic respiratory failure (HCC) Active Problems:   Hypertensive urgency   ESRD on dialysis [facility])   Thoracic ascending aortic aneurysm (HCC)   Bronchitis   Acute hypoxemic respiratory failure secondary to bronchitis and possible CAP Oxygen saturation 83% on room air with EMS.  Currently satting in the 90s on 2 L supplemental oxygen, no respiratory distress.  COVID and influenza PCR negative. CT angiogram chest negative for PE.  Showing diffuse bilateral bronchial wall thickening consistent with nonspecific  infectious or inflammatory bronchitis.  Trace bilateral pleural effusions and associated atelectasis or consolidation.  WBC 2.9.  No fever or signs of sepsis. -Ceftriaxone and azithromycin.  Antitussive as needed.  Bronchodilator as needed. Check procalcitonin level.  Blood culture x2.  Monitor WBC count.  Continue supplemental oxygen, wean as tolerated.  Hypertensive urgency Blood pressure elevated with systolic in the 275-170Y. -Continue home  amlodipine and Lopressor.  IV hydralazine PRN.  EKG changes EKG showing new T wave abnormality in anterior leads.  ACS less likely at high-sensitivity troponin mildly elevated but flat (65>65) and consistent with baseline.  Per ED documentation, patient complained of chest pain.  However, at the time of my evaluation he denies any history of chest pain. -Cardiac monitoring, echo ordered  ESRD on HD MWF No pulmonary edema on CT.  Potassium and bicarb normal.  No indication for urgent hemodialysis overnight. -Consult nephrology in the morning  Chronic diastolic CHF -Volume management per hemodialysis  Thrombocytopenia Platelet count 94k.  No signs of active bleeding. -Continue to monitor closely  Asthma Stable.  Not wheezing. -Continue home inhalers  Ascending thoracic aortic aneurysm Measuring 4.6 x 4.5 cm on CT. -Will need semiannual imaging and outpatient cardiothoracic surgery follow-up.  QT prolongation -Cardiac monitoring.  Monitor potassium and magnesium levels.  Avoid QT prolonging drugs if possible.  Repeat EKG in a.m.  DVT prophylaxis: SCDs Code Status: Patient wishes to be full code. Family Communication: No family available at this time. Disposition Plan: Status is: Observation  The patient remains OBS appropriate and will d/c before 2 midnights.  Dispo: The patient is from: Home              Anticipated d/c is to: Home              Patient currently is not medically stable to d/c.   Difficult to place patient No  Level  of care: Level of care: Telemetry Medical  The medical decision making on this patient was of high complexity and the patient is at high risk for clinical deterioration, therefore this is a level 3 visit.  Shela Leff MD [facility]  If 7PM-7AM, please contact night-coverage [email or web address]  05/18/2021, 9:30 PM

## 2021-05-19 ENCOUNTER — Other Ambulatory Visit (HOSPITAL_COMMUNITY): Payer: Medicare Other

## 2021-05-19 DIAGNOSIS — J9601 Acute respiratory failure with hypoxia: Secondary | ICD-10-CM | POA: Diagnosis not present

## 2021-05-19 LAB — MRSA NEXT GEN BY PCR, NASAL: MRSA by PCR Next Gen: NOT DETECTED

## 2021-05-19 MED ORDER — CHLORHEXIDINE GLUCONATE CLOTH 2 % EX PADS
6.0000 | MEDICATED_PAD | Freq: Every day | CUTANEOUS | Status: DC
  Administered 2021-05-19: 6 via TOPICAL

## 2021-05-19 MED ORDER — LORAZEPAM 0.5 MG PO TABS: 0.5000 mg | ORAL_TABLET | Freq: Once | ORAL | Status: DC | PRN

## 2021-05-19 NOTE — Progress Notes (Signed)
Patient left AMA, signed the AMA form, MD made aware, removed his peripheral IV x 2, refused his labs and VS this morning.  Staff member wheeled him out of the unit for his ride.

## 2021-05-19 NOTE — Discharge Summary (Signed)
Left AMA  Patient was admitted with acute hypoxemic respiratory failure due to bronchitis and possible community-acquired pneumonia, hypertensive urgency.  Patient also has a history of ESRD on hemodialysis Monday Wednesday Friday.  This morning RN called that patient left AMA, did not want to wait to be seen by physician. Patient signed AMA form and left AMA.

## 2021-05-19 NOTE — Progress Notes (Signed)
Patient refused labs this Am, states that he is to tired to do it now, maybe later. Lab states will come back about 8am and make another attempt.

## 2021-05-19 NOTE — Progress Notes (Signed)
Patient arrived to the unit via transport staff. Patient was transferred to bed, denies pain.no complaints at  this time other than he has not slept all day and is tired. Patient was oriented to the room/unit and all question answered. Patient resting comfortably .

## 2021-05-19 NOTE — ED Notes (Signed)
PT stated his L arm was hurting after Zithromax finished infusing.  No swelling/redness noted.  Pain decreased after IV was flushed - it was patent.  PT also requested anxiety meds.  He had removed his bp cuff, pulse ox and oxygen.  When pulse ox was re-placed, sats were 88% on RA.  Sats increased to 96% with 3L O2.  MD notified.

## 2021-05-19 NOTE — Consult Note (Signed)
WOC Nurse Consult Note: Reason for Consult:Unstageable pressure injury to medial coccygeal Wound type:Pressure Pressure Injury POA: Yes Measurement:Per Nursing Flow Sheet, 5cm x 5cm x 3m per Nursing Flow Sheet at 0415 this am, but Bedside RN, J. [place]. Wound VVO:[id number] tissue in wound bed per Bedside RN. Drainage (amount, consistency, odor) none Periwound: intact Dressing procedure/placement/frequency: Patient not seen. He has signed himself out AMA at the time of my visit.  [facility] nursing team will not follow, but will remain available to this patient, the nursing and medical teams.  Please re-consult if needed. Thanks, Maudie Flakes, MSN, RN, [place], Arther Abbott  Pager# [phone]

## 2021-05-24 IMAGING — DX DG CHEST 1V PORT
1 series · 1 of 1 positions shown · non-contrast
Comparison: 09/09/2020

CLINICAL DATA: Shortness of breath beginning this morning. Nose
bleed. CHF. Hypertension.

EXAM:
PORTABLE CHEST 1 VIEW

[chest ap]
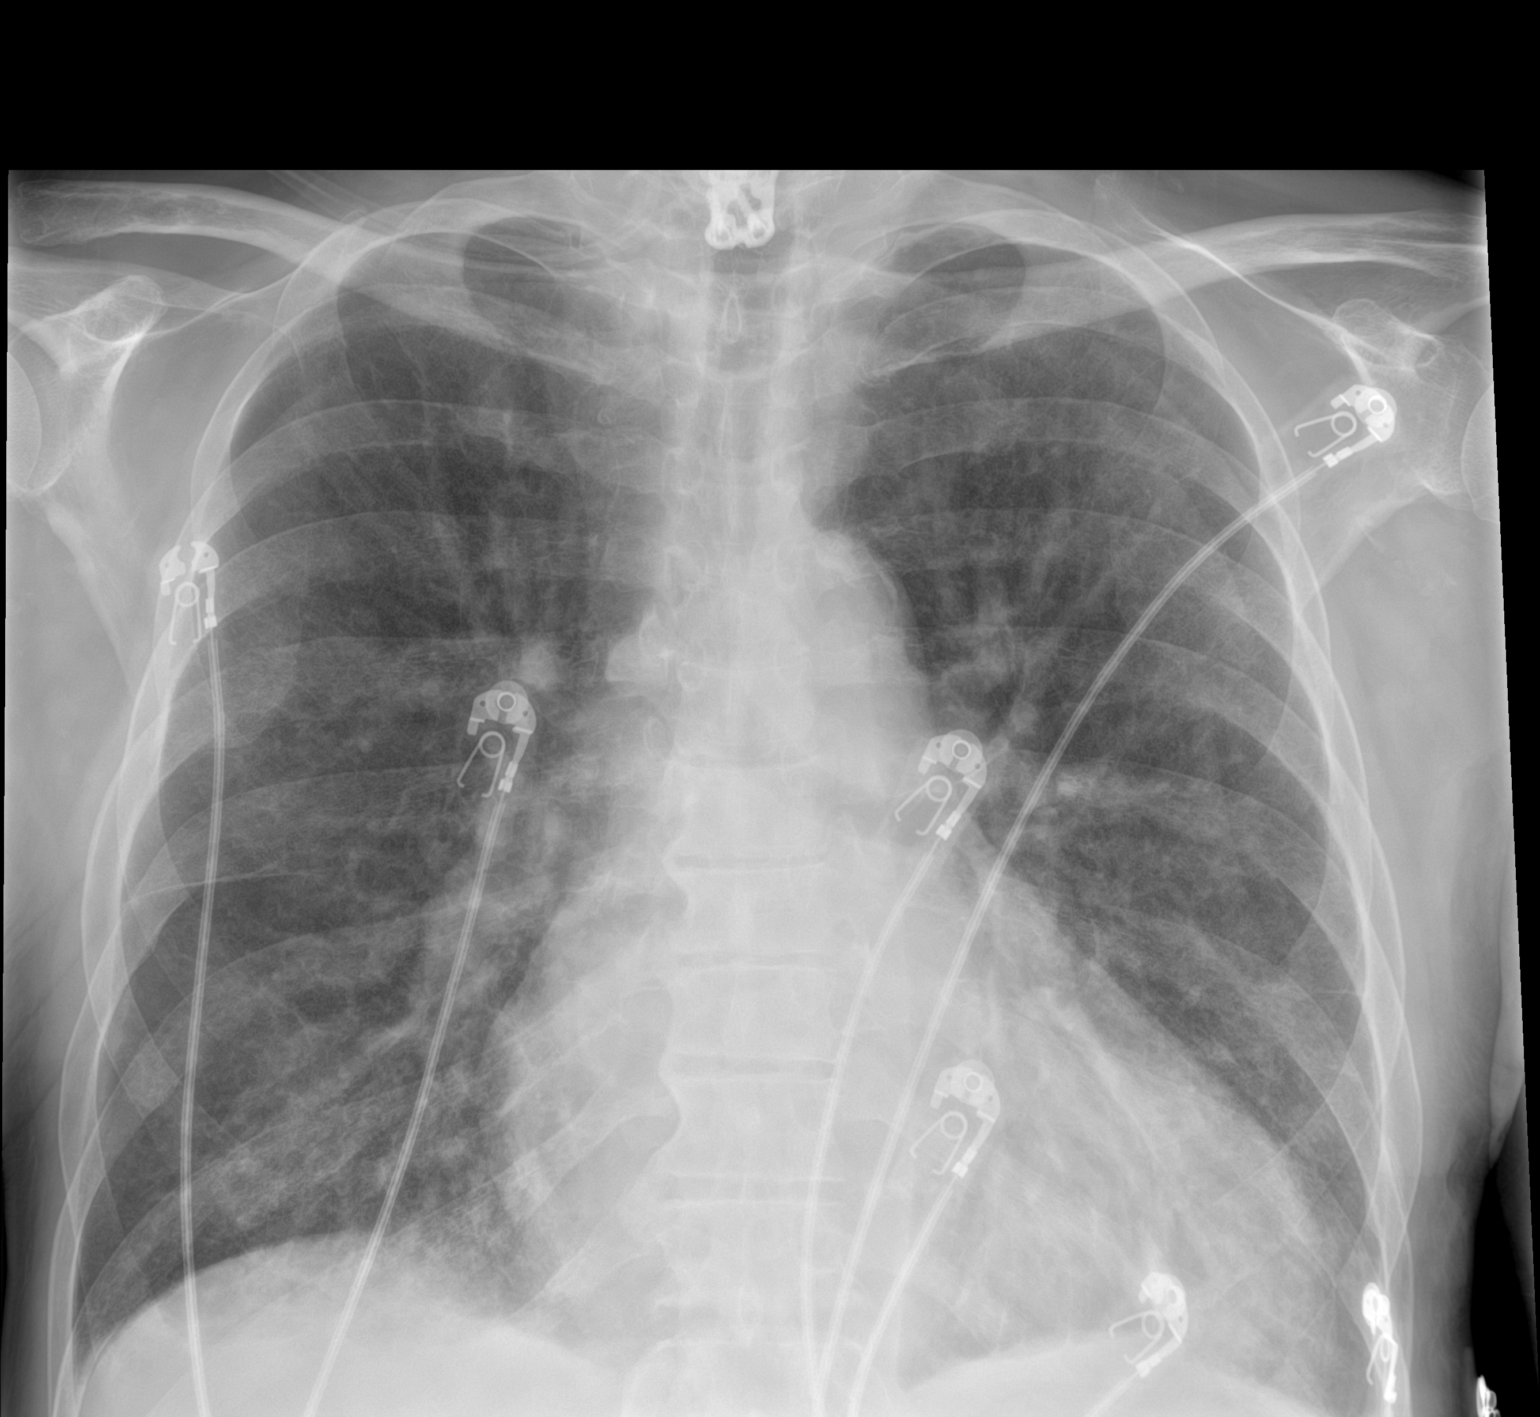

[1 of 1 positions shown; findings below may reference images not displayed]

FINDINGS: Unchanged cardiomegaly the. New bilateral airspace opacities.
Minimal pulmonary vascular congestion.
IMPRESSION: New bilateral airspace opacities may be due to multifocal pneumonia
or pulmonary edema.

## 2021-05-25 IMAGING — DX DG CHEST 1V PORT
1 series · 1 of 1 positions shown · non-contrast
Comparison: 09/18/2020

CLINICAL DATA: Shortness of breath

EXAM:
PORTABLE CHEST 1 VIEW

[chest ap]
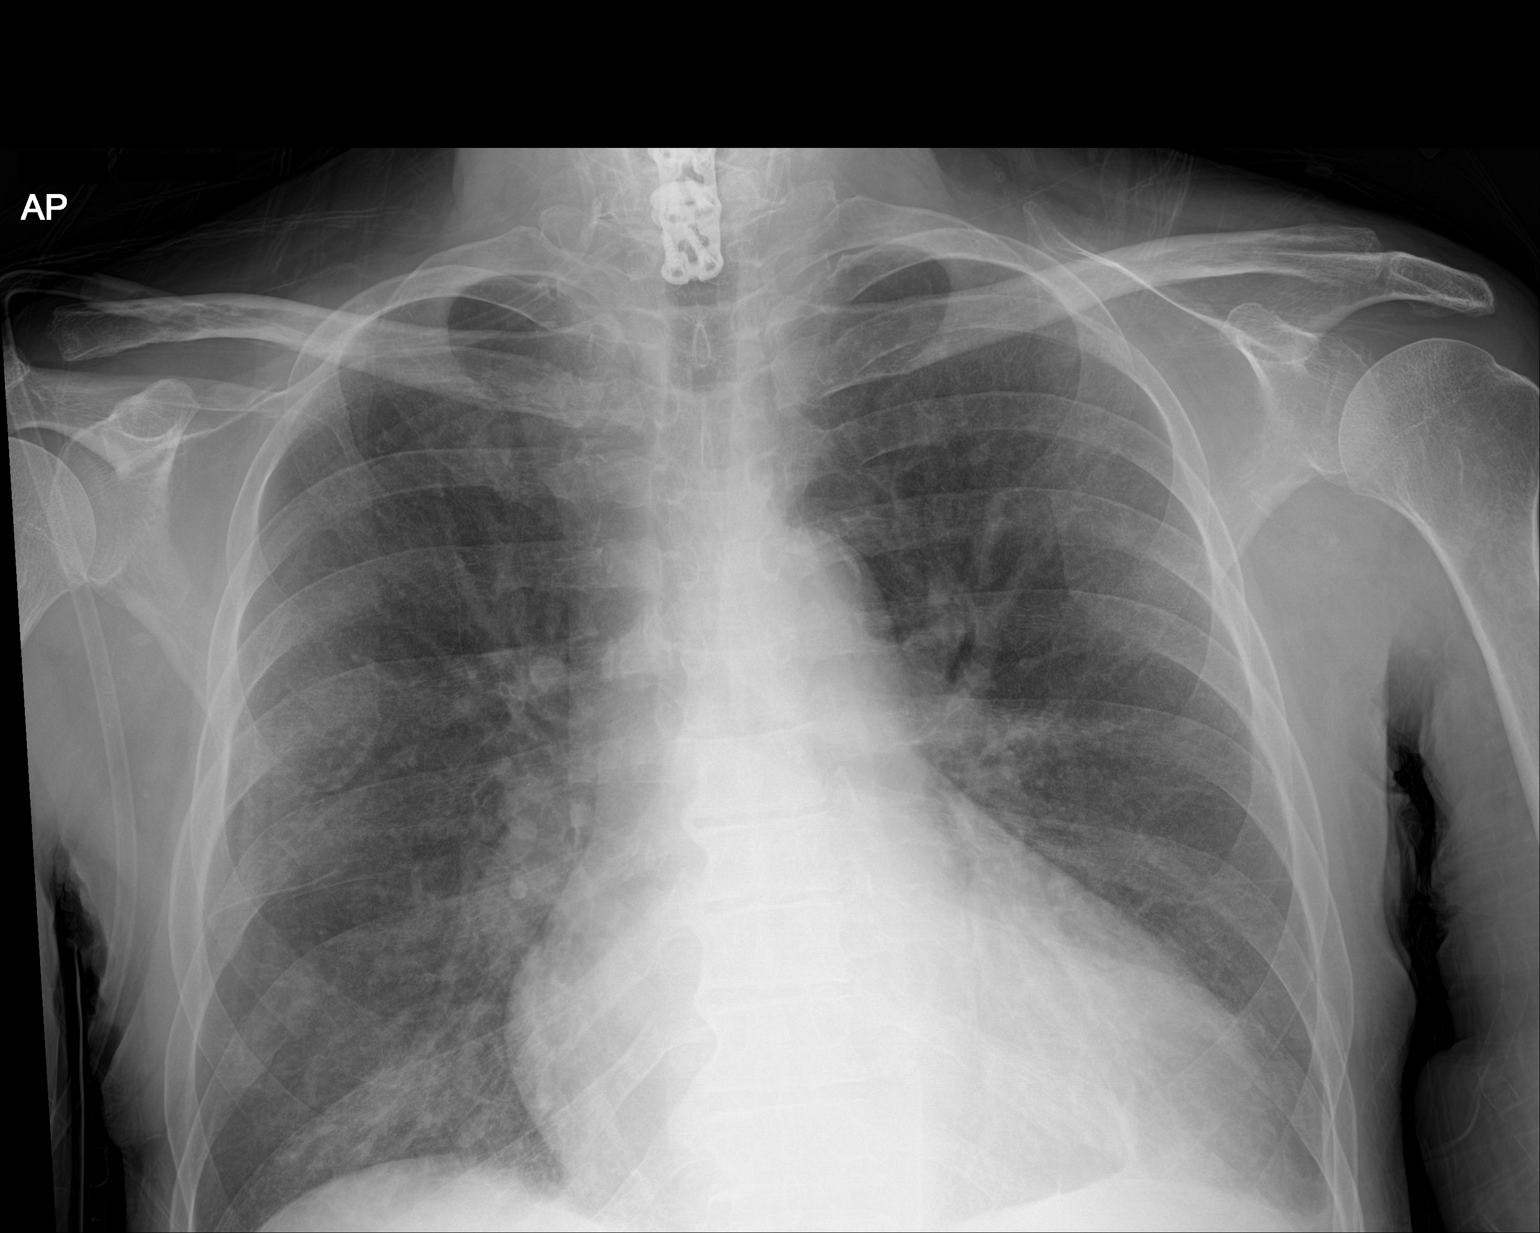

[1 of 1 positions shown; findings below may reference images not displayed]

FINDINGS: Chest bilateral patchy interstitial and alveolar airspace opacities.
No pleural effusion or pneumothorax. Stable cardiomegaly.

No acute osseous abnormality.
IMPRESSION: Bilateral patchy interstitial and alveolar airspace opacities
concerning for multilobar pneumonia including atypical viral
pneumonia.

## 2021-05-27 LAB — CULTURE, BLOOD (ROUTINE X 2)
Culture: NO GROWTH
Culture: NO GROWTH
Special Requests: ADEQUATE
Special Requests: ADEQUATE

## 2021-05-30 ENCOUNTER — Encounter (HOSPITAL_COMMUNITY): Payer: Self-pay | Admitting: Emergency Medicine

## 2021-05-30 ENCOUNTER — Emergency Department (HOSPITAL_COMMUNITY): Payer: Medicare Other

## 2021-05-30 ENCOUNTER — Observation Stay (HOSPITAL_COMMUNITY)
Admission: EM | Admit: 2021-05-30 | Discharge: 2021-06-01 | Disposition: A | Discharge status: Home / Self Care | Payer: Medicare Other | Attending: Family Medicine | Admitting: Family Medicine

## 2021-05-30 DIAGNOSIS — Z79899 Other long term (current) drug therapy: Secondary | ICD-10-CM | POA: Insufficient documentation

## 2021-05-30 DIAGNOSIS — Z20822 Contact with and (suspected) exposure to covid-19: Secondary | ICD-10-CM | POA: Diagnosis not present

## 2021-05-30 DIAGNOSIS — J45909 Unspecified asthma, uncomplicated: Secondary | ICD-10-CM | POA: Insufficient documentation

## 2021-05-30 DIAGNOSIS — R079 Chest pain, unspecified: Secondary | ICD-10-CM

## 2021-05-30 DIAGNOSIS — N186 End stage renal disease: Secondary | ICD-10-CM | POA: Diagnosis not present

## 2021-05-30 DIAGNOSIS — R519 Headache, unspecified: Secondary | ICD-10-CM | POA: Insufficient documentation

## 2021-05-30 DIAGNOSIS — Z992 Dependence on renal dialysis: Secondary | ICD-10-CM | POA: Diagnosis not present

## 2021-05-30 DIAGNOSIS — D631 Anemia in chronic kidney disease: Secondary | ICD-10-CM | POA: Diagnosis not present

## 2021-05-30 DIAGNOSIS — I10 Essential (primary) hypertension: Secondary | ICD-10-CM

## 2021-05-30 DIAGNOSIS — I160 Hypertensive urgency: Secondary | ICD-10-CM | POA: Diagnosis not present

## 2021-05-30 DIAGNOSIS — I5033 Acute on chronic diastolic (congestive) heart failure: Secondary | ICD-10-CM | POA: Diagnosis not present

## 2021-05-30 DIAGNOSIS — Z8673 Personal history of transient ischemic attack (TIA), and cerebral infarction without residual deficits: Secondary | ICD-10-CM | POA: Insufficient documentation

## 2021-05-30 DIAGNOSIS — E8770 Fluid overload, unspecified: Secondary | ICD-10-CM | POA: Insufficient documentation

## 2021-05-30 DIAGNOSIS — R0789 Other chest pain: Secondary | ICD-10-CM | POA: Diagnosis present

## 2021-05-30 DIAGNOSIS — I132 Hypertensive heart and chronic kidney disease with heart failure and with stage 5 chronic kidney disease, or end stage renal disease: Secondary | ICD-10-CM | POA: Insufficient documentation

## 2021-05-30 LAB — CBC WITH DIFFERENTIAL/PLATELET
Abs Immature Granulocytes: 0.01 10*3/uL (ref 0.00–0.07)
Basophils Absolute: 0.1 10*3/uL (ref 0.0–0.1)
Basophils Relative: 2 %
Eosinophils Absolute: 0.1 10*3/uL (ref 0.0–0.5)
Eosinophils Relative: 4 %
HCT: 32.2 % — ABNORMAL LOW (ref 39.0–52.0)
Hemoglobin: 10.1 g/dL — ABNORMAL LOW (ref 13.0–17.0)
Immature Granulocytes: 0 %
Lymphocytes Relative: 18 %
Lymphs Abs: 0.6 10*3/uL — ABNORMAL LOW (ref 0.7–4.0)
MCH: 32.8 pg (ref 26.0–34.0)
MCHC: 31.4 g/dL (ref 30.0–36.0)
MCV: 104.5 fL — ABNORMAL HIGH (ref 80.0–100.0)
Monocytes Absolute: 0.3 10*3/uL (ref 0.1–1.0)
Monocytes Relative: 10 %
Neutro Abs: 2.3 10*3/uL (ref 1.7–7.7)
Neutrophils Relative %: 66 %
Platelets: 143 10*3/uL — ABNORMAL LOW (ref 150–400)
RBC: 3.08 MIL/uL — ABNORMAL LOW (ref 4.22–5.81)
RDW: 14.9 % (ref 11.5–15.5)
WBC: 3.4 10*3/uL — ABNORMAL LOW (ref 4.0–10.5)
nRBC: 0 % (ref 0.0–0.2)

## 2021-05-30 LAB — COMPREHENSIVE METABOLIC PANEL
ALT: 11 U/L (ref 0–44)
AST: 22 U/L (ref 15–41)
Albumin: 3.3 g/dL — ABNORMAL LOW (ref 3.5–5.0)
Alkaline Phosphatase: 102 U/L (ref 38–126)
Anion gap: 16 — ABNORMAL HIGH (ref 5–15)
BUN: 26 mg/dL — ABNORMAL HIGH (ref 8–23)
CO2: 26 mmol/L (ref 22–32)
Calcium: 10.4 mg/dL — ABNORMAL HIGH (ref 8.9–10.3)
Chloride: 99 mmol/L (ref 98–111)
Creatinine, Ser: 5.65 mg/dL — ABNORMAL HIGH (ref 0.61–1.24)
GFR, Estimated: 11 mL/min — ABNORMAL LOW (ref >60)
Glucose, Bld: 87 mg/dL (ref 70–99)
Potassium: 3.8 mmol/L (ref 3.5–5.1)
Sodium: 141 mmol/L (ref 135–145)
Total Bilirubin: 1.3 mg/dL — ABNORMAL HIGH (ref 0.3–1.2)
Total Protein: 6.6 g/dL (ref 6.5–8.1)

## 2021-05-30 LAB — TROPONIN I (HIGH SENSITIVITY)
Troponin I (High Sensitivity): 79 ng/L — ABNORMAL HIGH (ref <18)
Troponin I (High Sensitivity): 81 ng/L — ABNORMAL HIGH (ref <18)

## 2021-05-30 LAB — RESP PANEL BY RT-PCR (FLU A&B, COVID) ARPGX2
Influenza A by PCR: NEGATIVE
Influenza B by PCR: NEGATIVE
SARS Coronavirus 2 by RT PCR: NEGATIVE

## 2021-05-30 MED ORDER — PREGABALIN 25 MG PO CAPS
25.0000 mg | ORAL_CAPSULE | Freq: Three times a day (TID) | ORAL | Status: DC
  Administered 2021-05-31 – 2021-06-01 (×4): 25 mg via ORAL
  Filled 2021-05-30 (×4): qty 1

## 2021-05-30 MED ORDER — HYDRALAZINE HCL 20 MG/ML IJ SOLN: 10.0000 mg | INTRAMUSCULAR | Status: DC | PRN

## 2021-05-30 MED ORDER — ALBUTEROL SULFATE (2.5 MG/3ML) 0.083% IN NEBU: 2.5000 mg | INHALATION_SOLUTION | Freq: Two times a day (BID) | RESPIRATORY_TRACT | Status: DC | PRN

## 2021-05-30 MED ORDER — AMLODIPINE BESYLATE 10 MG PO TABS
10.0000 mg | ORAL_TABLET | Freq: Every day | ORAL | Status: DC
  Administered 2021-05-31 – 2021-06-01 (×2): 10 mg via ORAL
  Filled 2021-05-30: qty 1
  Filled 2021-05-30: qty 2

## 2021-05-30 MED ORDER — TRAMADOL HCL 50 MG PO TABS
50.0000 mg | ORAL_TABLET | Freq: Four times a day (QID) | ORAL | Status: DC | PRN
  Administered 2021-05-31: 50 mg via ORAL
  Filled 2021-05-30: qty 1

## 2021-05-30 MED ORDER — HEPARIN SODIUM (PORCINE) 5000 UNIT/ML IJ SOLN
5000.0000 [IU] | Freq: Three times a day (TID) | INTRAMUSCULAR | Status: DC
  Administered 2021-05-31 – 2021-06-01 (×5): 5000 [IU] via SUBCUTANEOUS
  Filled 2021-05-30 (×5): qty 1

## 2021-05-30 MED ORDER — FLUTICASONE FUROATE-VILANTEROL 100-25 MCG/INH IN AEPB
1.0000 | INHALATION_SPRAY | Freq: Every day | RESPIRATORY_TRACT | Status: DC
  Filled 2021-05-30: qty 28

## 2021-05-30 MED ORDER — RENA-VITE PO TABS
1.0000 | ORAL_TABLET | Freq: Every day | ORAL | Status: DC
  Administered 2021-05-31: 1 via ORAL
  Filled 2021-05-30 (×2): qty 1

## 2021-05-30 MED ORDER — NITROGLYCERIN 0.4 MG SL SUBL
0.4000 mg | SUBLINGUAL_TABLET | SUBLINGUAL | Status: DC | PRN
  Administered 2021-05-30: 0.4 mg via SUBLINGUAL
  Filled 2021-05-30: qty 1

## 2021-05-30 MED ORDER — METOPROLOL TARTRATE 50 MG PO TABS
50.0000 mg | ORAL_TABLET | Freq: Two times a day (BID) | ORAL | Status: DC
  Administered 2021-05-31 – 2021-06-01 (×2): 50 mg via ORAL
  Filled 2021-05-30: qty 2
  Filled 2021-05-30: qty 1

## 2021-05-30 MED ORDER — FUROSEMIDE 40 MG PO TABS: 80.0000 mg | ORAL_TABLET | Freq: Every day | ORAL | Status: DC | PRN

## 2021-05-30 MED ORDER — HYDRALAZINE HCL 20 MG/ML IJ SOLN
20.0000 mg | Freq: Once | INTRAMUSCULAR | Status: AC
  Administered 2021-05-30: 20 mg via INTRAVENOUS
  Filled 2021-05-30: qty 1

## 2021-05-30 MED ORDER — HYDRALAZINE HCL 50 MG PO TABS
50.0000 mg | ORAL_TABLET | Freq: Three times a day (TID) | ORAL | Status: DC
  Administered 2021-05-31 – 2021-06-01 (×4): 50 mg via ORAL
  Filled 2021-05-30: qty 2
  Filled 2021-05-30: qty 1
  Filled 2021-05-30: qty 2
  Filled 2021-05-30: qty 1
  Filled 2021-05-30: qty 2

## 2021-05-30 NOTE — ED Triage Notes (Signed)
Patient BIB [facility] for chest pain that improved with SL NTG. MWF dialysis. Received 324 mg ASA PTA. BP 190/115 with EMS. 20g left AC. Dialysis port in chest, bruit in right arm from immature fistula.

## 2021-05-30 NOTE — H&P (Signed)
History and Physical    Thomas Robinson TIR:[id number] DOB: Jan 06, 1961 DOA: 05/30/2021  PCP: [name], PA  Patient coming from: Home.  Chief Complaint: Chest pain.  HPI: Thomas Robinson is a 60 y.o. male with history of ESRD on hemodialysis on Monday Wednesday Friday, hypertension, ascending thoracic aortic aneurysm, anemia, recent lumbar surgery was in the hospital recently signed out AMA presents to the ER with complaints of chest pressure over the last 3 days.  Patient chest pain has been episodic pressure-like with no associated productive cough fever or chills.  He had gone to the dialysis yesterday but was unable to complete full session because of low back pain.  This morning he started having chest pressure again he decided to come to the ER.  Patient has not been taking his blood pressure medicines almost 4 months now.  ED Course: In the ER patient blood pressure was found to be 676 systolic for which patient was given IV hydralazine 20 mg and sublingual nitroglycerin.  Following which patient chest pressure improved and also his blood pressure.  Chest x-ray shows possible fluid overload.  COVID test was negative high sensitive troponins were 79 and 81.  Blood work show macrocytic anemia.  Basic metabolic panel shows creatinine 5.65 potassium 3.8.  Patient admitted for chest pressure with hypertensive urgency.  Review of Systems: As per HPI, rest all negative.   Past Medical History:  Diagnosis Date   Anaphylactic shock, unspecified, sequela 06/10/2019   Aortic stenosis    mild-moderate AS 12/2020 echo   Arthritis    Asthma, chronic, unspecified asthma severity, with acute exacerbation 10/23/2017   CAP (community acquired pneumonia) 10/23/2017   Carpal tunnel syndrome of right wrist 10/05/2018   Cataract    right - removed by surgery   Cerebral infarction due to thrombosis of cerebral artery (HCC)    Cervical disc herniation 01/04/2020   Chronic diastolic heart failure  ([place]) 04/13/2018   Chronic low back pain 11/24/2019   Constipation    Cough    chronic cough   Dyspnea    Encounter for immunization 07/08/2017   ESRD on hemodialysis ([place]) 02/16/2018   Fall 06/04/2019   GERD (gastroesophageal reflux disease) 06/04/2019   GI bleeding 05/24/2017   Gram-negative sepsis, unspecified ([place]) 09/05/2017   History of fusion of cervical spine 03/26/2020   Hyperlipidemia    Hypertension    Hypokalemia 06/04/2017   Iron deficiency anemia, unspecified 09/09/2017   Left shoulder pain 11/11/2019   Lumbar radiculopathy 11/24/2019   Lung contusion 06/04/2019   LVH (left ventricular hypertrophy) due to hypertensive disease, with heart failure ([place]) 06/18/2020   Macrocytic anemia 05/24/2017   Moderate protein-calorie malnutrition ([place]) 06/06/2017   Myofascial pain syndrome 06/12/2020   Neuritis of right ulnar nerve 09/13/2018   Non-compliance with renal dialysis ([place]) 02/08/2020   Oxygen deficiency 12/30/2019   O2 sats on RA 87% at PAT appt    Pain in joint of right elbow 09/13/2018   Pulmonary hypertension ([place])    Renovascular hypertension 06/22/2020   Restless leg syndrome    Rib fractures 06/2019   Right   S/P cardiac cath 08/27/2020   normal coronary arteries.   Secondary hyperparathyroidism of renal origin ([place]) 06/04/2017   Thoracic ascending aortic aneurysm (HCC)    4.5 cm 06/04/19 CT   Ulnar neuropathy 10/05/2018   Volume overload 10/23/2017    Past Surgical History:  Procedure Laterality Date   A/V FISTULAGRAM N/A 01/01/2021   Procedure: A/V FISTULAGRAM;  Surgeon: Serafina Mitchell, MD;  Location: [facility];  Service: Cardiovascular;  Laterality: N/A;   A/V FISTULAGRAM N/A 01/15/2021   Procedure: A/V [id number];  Surgeon: Serafina Mitchell, MD;  Location: [facility];  Service: Cardiovascular;  Laterality: N/A;   ANTERIOR CERVICAL DECOMP/DISCECTOMY FUSION N/A 01/04/2020   Procedure: ANTERIOR CERVICAL DECOMPRESSION/DISCECTOMY FUSION  CERVICAL FIVE THROUGH SEVEN;  Surgeon: Melina Schools, MD;  Location: [facility];  Service: Orthopedics;  Laterality: N/A;  3 hrs   AV FISTULA PLACEMENT Left 05/28/2017   Procedure: LEFT ARM ARTERIOVENOUS (AV) FISTULA CREATION;  Surgeon: Conrad Chemung, MD;  Location: [facility];  Service: Vascular;  Laterality: Left;   AV FISTULA PLACEMENT Left 02/02/2021   Procedure: Debride left forearm, ligation of left upper arm Aretiovenous fistula;  Surgeon: Elam Dutch, MD;  [facility];  Service: Vascular;  Laterality: Left;   AV FISTULA PLACEMENT Right 04/04/2021   Procedure: RIGHT ARTERIOVENOUS (AV) FISTULA CREATION;  Surgeon: Serafina Mitchell, MD;  Location: [facility] OR;  Service: Vascular;  Laterality: Right;   EYE SURGERY Right 06/02/2019   Cataract removed   FRACTURE SURGERY     INSERTION OF DIALYSIS CATHETER Right 02/02/2021   Procedure: INSERTION OF Right Internal jugular TUNNELED  DIALYSIS CATHETER;  Surgeon: Elam Dutch, MD;  Location: [facility];  Service: Vascular;  Laterality: Right;   IR DIALY SHUNT INTRO NEEDLE/INTRACATH INITIAL W/IMG LEFT Left 09/12/2020   IR FLUORO GUIDE CV LINE RIGHT  05/25/2017   IR US GUIDE VASC ACCESS LEFT  09/12/2020   IR US GUIDE VASC ACCESS RIGHT  05/25/2017   LIGATION OF ARTERIOVENOUS  FISTULA Left 01/10/2021   Procedure: LIGATION OF LEFT ARM RADIOCEPHALIC FISTULA;  Surgeon: Serafina Mitchell, MD;  Location: [facility];  Service: Vascular;  Laterality: Left;   PERIPHERAL VASCULAR BALLOON ANGIOPLASTY Left 01/15/2021   Procedure: PERIPHERAL VASCULAR BALLOON ANGIOPLASTY;  Surgeon: Serafina Mitchell, MD;  Location: [facility];  Service: Cardiovascular;  Laterality: Left;  AVF   REVISON OF ARTERIOVENOUS FISTULA Left 07/18/2019   Procedure: REVISION PLICATION OF RADIOCEPHALIC ARTERIOVENOUS FISTULA LEFT ARM;  Surgeon: Angelia Mould, MD;  Location: [facility];  Service: Vascular;  Laterality: Left;   REVISON OF ARTERIOVENOUS FISTULA Left 01/10/2021   Procedure: CONVERSION TO  BRACHIOCEPHALIC ARTERIOVENOUS FISTULA;  Surgeon: Serafina Mitchell, MD;  Location: [facility];  Service: Vascular;  Laterality: Left;   RINOPLASTY       reports that he has never smoked. He has never used smokeless tobacco. He reports that he does not currently use alcohol. He reports that he does not use drugs.  Allergies  Allergen Reactions   Vancomycin Shortness Of Breath and Itching    Family History  Problem Relation Age of Onset   Cancer Mother     Prior to Admission medications   Medication Sig Start Date End Date Taking? Authorizing Provider  acetaminophen (TYLENOL) 500 MG tablet Take 500 mg by mouth every 6 (six) hours as needed for mild pain, moderate pain or headache.    [provider]  albuterol (VENTOLIN HFA) 108 (90 Base) MCG/ACT inhaler Inhale 2 puffs into the lungs 2 (two) times daily as needed for shortness of breath or wheezing.    [provider]  amLODipine (NORVASC) 10 MG tablet Take 10 mg by mouth daily.     [provider]  AURYXIA 1 GM 210 MG(Fe) tablet Take 420 mg by mouth 3 (three) times daily with meals. 03/20/21  [provider]  BREO ELLIPTA 100-25 MCG/INH AEPB Inhale 1 puff into the lungs daily. 06/26/20   [provider]  cinacalcet (SENSIPAR) 60 MG tablet Take 60 mg by mouth every evening. 03/18/21   [provider]  DIPHENHYDRAMINE HCL PO Take 25 mg by mouth as needed (allergies). Patient not taking: Reported on 05/18/2021 09/24/20 09/23/21  [provider]  doxercalciferol (HECTOROL) 0.5 MCG capsule Take 0.5 mcg by mouth 3 (three) times a week. 11/26/20 11/25/21  [provider]  furosemide (LASIX) 80 MG tablet Take 80 mg by mouth See admin instructions. Take 80 mg by mouth daily as needed/as directed on non-dialysis days of Sun/Tues/Thurs/Sat    [provider]  hydrALAZINE (APRESOLINE) 25 MG tablet Take 50 mg by mouth in the morning and at bedtime.    [provider]   metoprolol tartrate (LOPRESSOR) 100 MG tablet Take 0.5 tablets (50 mg total) by mouth 2 (two) times daily. 01/15/21   Alma Friendly, MD  multivitamin (RENA-VIT) TABS tablet Take 1 tablet by mouth daily. 01/12/18   [provider]  pregabalin (LYRICA) 25 MG capsule Take 25 mg by mouth 3 (three) times daily with meals. 11/29/20   [provider]  sevelamer carbonate (RENVELA) 800 MG tablet Take 800 mg by mouth 3 (three) times daily with meals. 11/07/19   [provider]  traMADol (ULTRAM) 50 MG tablet Take 1 tablet (50 mg total) by mouth every 6 (six) hours as needed. Patient taking differently: Take 50 mg by mouth every 6 (six) hours as needed (for pain). 04/04/21 04/04/22  Karoline Caldwell, PA-C    Physical Exam: Constitutional: Moderately built and nourished. Vitals:   05/30/21 2200 05/30/21 2235 05/30/21 2300 05/30/21 2306  BP: (!) 194/103 (!) 150/78 (!) 181/53 (!) 148/79  Pulse: 77 87 (!) 102 99  Resp: (!) 26 (!) 24 (!) 22 (!) 27  Temp:      TempSrc:      SpO2: 96% 93% 96% 95%   Eyes: Anicteric no pallor. ENMT: No discharge from the ears eyes nose and mouth. Neck: No mass felt.  No neck rigidity. Respiratory: No rhonchi or crepitations. Cardiovascular: S1-S2 heard. Abdomen: Soft nontender bowel sound present. Musculoskeletal: No edema. Skin: No rash. Neurologic: Alert awake oriented to time place and person.  Moves all extremities. Psychiatric: Appears normal.  Normal affect.   Labs on Admission: I have personally reviewed following labs and imaging studies  CBC: Recent Labs  Lab 05/30/21 1700  WBC 3.4*  NEUTROABS 2.3  HGB 10.1*  HCT 32.2*  MCV 104.5*  PLT 244*   Basic Metabolic Panel: Recent Labs  Lab 05/30/21 1700  NA 141  K 3.8  CL 99  CO2 26  GLUCOSE 87  BUN 26*  CREATININE 5.65*  CALCIUM 10.4*   GFR: Estimated Creatinine Clearance: 11.3 mL/min (A) (by C-G formula based on SCr of 5.65 mg/dL (H)). Liver Function Tests: Recent  Labs  Lab 05/30/21 1700  AST 22  ALT 11  ALKPHOS 102  BILITOT 1.3*  PROT 6.6  ALBUMIN 3.3*   No results for input(s): LIPASE, AMYLASE in the last 168 hours. No results for input(s): AMMONIA in the last 168 hours. Coagulation Profile: No results for input(s): INR, PROTIME in the last 168 hours. Cardiac Enzymes: No results for input(s): CKTOTAL, CKMB, CKMBINDEX, TROPONINI in the last 168 hours. BNP (last 3 results) No results for input(s): PROBNP in the last 8760 hours. HbA1C: No results for input(s): HGBA1C in  the last 72 hours. CBG: No results for input(s): GLUCAP in the last 168 hours. Lipid Profile: No results for input(s): CHOL, HDL, LDLCALC, TRIG, CHOLHDL, LDLDIRECT in the last 72 hours. Thyroid Function Tests: No results for input(s): TSH, T4TOTAL, FREET4, T3FREE, THYROIDAB in the last 72 hours. Anemia Panel: No results for input(s): VITAMINB12, FOLATE, FERRITIN, TIBC, IRON, RETICCTPCT in the last 72 hours. Urine analysis:    Component Value Date/Time   COLORURINE YELLOW 05/24/2017 1729   APPEARANCEUR HAZY (A) 05/24/2017 1729   LABSPEC 1.011 05/24/2017 1729   PHURINE 5.0 05/24/2017 1729   GLUCOSEU 50 (A) 05/24/2017 1729   HGBUR SMALL (A) 05/24/2017 1729   BILIRUBINUR NEGATIVE 05/24/2017 1729   KETONESUR NEGATIVE 05/24/2017 1729   PROTEINUR 100 (A) 05/24/2017 1729   UROBILINOGEN 0.2 04/09/2015 1527   NITRITE NEGATIVE 05/24/2017 1729   LEUKOCYTESUR TRACE (A) 05/24/2017 1729   Sepsis Labs: @LABRCNTIP (procalcitonin:4,lacticidven:4) ) Recent Results (from the past 240 hour(s))  Resp Panel by RT-PCR (Flu A&B, Covid) Nasopharyngeal Swab     Status: None   Collection Time: 05/30/21  8:21 PM   Specimen: Nasopharyngeal Swab; Nasopharyngeal(NP) swabs in vial transport medium  Result Value Ref Range Status   SARS Coronavirus 2 by RT PCR NEGATIVE NEGATIVE Final    Comment: (NOTE) SARS-CoV-2 target nucleic acids are NOT DETECTED.  The SARS-CoV-2 RNA is generally  detectable in upper respiratory specimens during the acute phase of infection. The lowest concentration of SARS-CoV-2 viral copies this assay can detect is 138 copies/mL. A negative result does not preclude SARS-Cov-2 infection and should not be used as the sole basis for treatment or other patient management decisions. A negative result may occur with  improper specimen collection/handling, submission of specimen other than nasopharyngeal swab, presence of viral mutation(s) within the areas targeted by this assay, and inadequate number of viral copies(<138 copies/mL). A negative result must be combined with clinical observations, patient history, and epidemiological information. The expected result is Negative.  Fact Sheet for Patients:  EntrepreneurPulse.com.au  Fact Sheet for Healthcare Providers:  IncredibleEmployment.be  This test is no t yet approved or cleared by the [place] FDA and  has been authorized for detection and/or diagnosis of SARS-CoV-2 by FDA under an Emergency Use Authorization (EUA). This EUA will remain  in effect (meaning this test can be used) for the duration of the COVID-19 declaration under Section 564(b)(1) of the Act, 21 U.S.C.section 360bbb-3(b)(1), unless the authorization is terminated  or revoked sooner.       Influenza A by PCR NEGATIVE NEGATIVE Final   Influenza B by PCR NEGATIVE NEGATIVE Final    Comment: (NOTE) The Xpert Xpress SARS-CoV-2/FLU/RSV plus assay is intended as an aid in the diagnosis of influenza from Nasopharyngeal swab specimens and should not be used as a sole basis for treatment. Nasal washings and aspirates are unacceptable for Xpert Xpress SARS-CoV-2/FLU/RSV testing.  Fact Sheet for Patients: EntrepreneurPulse.com.au  Fact Sheet for Healthcare Providers: IncredibleEmployment.be  This test is not yet approved or cleared by the [place] FDA  and has been authorized for detection and/or diagnosis of SARS-CoV-2 by FDA under an Emergency Use Authorization (EUA). This EUA will remain in effect (meaning this test can be used) for the duration of the COVID-19 declaration under Section 564(b)(1) of the Act, 21 U.S.C. section 360bbb-3(b)(1), unless the authorization is terminated or revoked.  Performed at [facility], [place] [address], [place], [place] [address]      Radiological Exams on Admission: DG  Chest 2 View  Result Date: 05/30/2021 CLINICAL DATA:  Chest pain. EXAM: CHEST - 2 VIEW COMPARISON:  CT angiogram chest 05/18/2021. FINDINGS: Right-sided central venous catheter tip projects over the distal SVC. The heart is enlarged, unchanged. There are hazy opacities in both lungs. There is a stable small right pleural effusion. There some strandy opacities in the left lung base. There is no pneumothorax. Cervical spinal fusion plate is again noted. No acute fractures are seen. IMPRESSION: 1. Cardiomegaly. 2. Hazy bilateral opacities may represent mild edema. 3. Stable small right pleural effusion. 4. Left basilar atelectasis. Electronically Signed   By: Ronney Asters M.D.   On: 05/30/2021 17:51   CT Head Wo Contrast  Result Date: 05/30/2021 CLINICAL DATA:  Headache.  Hypertension. EXAM: CT HEAD WITHOUT CONTRAST TECHNIQUE: Contiguous axial images were obtained from the base of the skull through the vertex without intravenous contrast. COMPARISON:  02/18/2021 FINDINGS: Brain: No evidence of acute infarction, hemorrhage, hydrocephalus, extra-axial collection or mass lesion/mass effect. Remote right cerebellar hemisphere infarct. There is mild diffuse low-attenuation within the subcortical and periventricular white matter compatible with chronic microvascular disease. Vascular: No hyperdense vessel or unexpected calcification. Skull: Normal. Negative for fracture or focal lesion. Sinuses/Orbits: Mild mucosal thickening within the maxillary  sinuses. Other: None IMPRESSION: 1. No acute intracranial abnormalities. 2. Chronic small vessel ischemic disease and brain atrophy. Electronically Signed   By: Kerby Moors M.D.   On: 05/30/2021 18:47    EKG: Independently reviewed.  Normal sinus rhythm.  Assessment/Plan Principal Problem:   Hypertensive urgency Active Problems:   Chest pain   End stage renal disease (HCC)    Hypertensive urgency -likely secondary to noncompliance with medications patient has ran out of his medicines for almost a month now.  We will restart his hydralazine metoprolol and amlodipine.  Closely follow blood pressure trends.  As needed IV hydralazine for systolic more than 494.  I think blood pressure will also improve with dialysis. Chest pain likely from hypertensive urgency.  We will cycle cardiac markers closely monitor blood pressure trends.  Aspirin. ESRD on hemodialysis on Monday Wednesday and Friday we will consult nephrology for dialysis. History of ascending thoracic aortic aneurysm -patient chest pain is improved at this time.  If it recurs may need further imaging. Anemia follow CBC.  Check anemia panel since patient has macrocytosis. Grade 1 diastolic dysfunction moderate aortic valve stenosis per 2D echo done in May 2022. Low back surgery recently.   DVT prophylaxis: Heparin. Code Status: Full code. Family Communication: Discussed with patient. Disposition Plan: Home. Consults called: Will need to consult nephrology. Admission status: Observation.   Rise Patience MD [facility] Pager [phone].  If 7PM-7AM, please contact night-coverage [email or web address] Password [facility]  05/30/2021, 11:52 PM

## 2021-05-30 NOTE — ED Provider Notes (Signed)
[facility] EMERGENCY DEPARTMENT Provider Note   CSN: [id number] Arrival date & time: 05/30/21  1632     History Chief Complaint  Patient presents with   Chest Pain    Thomas Robinson is a 60 y.o. male.   Chest Pain Associated symptoms: cough and shortness of breath   Associated symptoms: no abdominal pain, no back pain and no weakness   Patient presents with dull chest pain and cough.  Has been going for last couple days.  States he does not feel right.  History of end-stage renal disease.  Dialyzed yesterday.  States he did not do the whole dialysis however because of his back pain.  History of heart failures.  Also apparent history of asthma.  No swelling in his legs.  States he has a cough with some mild sputum production.  Also headache blurred vision.  Also short of breath.    Past Medical History:  Diagnosis Date   Anaphylactic shock, unspecified, sequela 06/10/2019   Aortic stenosis    mild-moderate AS 12/2020 echo   Arthritis    Asthma, chronic, unspecified asthma severity, with acute exacerbation 10/23/2017   CAP (community acquired pneumonia) 10/23/2017   Carpal tunnel syndrome of right wrist 10/05/2018   Cataract    right - removed by surgery   Cerebral infarction due to thrombosis of cerebral artery (HCC)    Cervical disc herniation 01/04/2020   Chronic diastolic heart failure ([place]) 04/13/2018   Chronic low back pain 11/24/2019   Constipation    Cough    chronic cough   Dyspnea    Encounter for immunization 07/08/2017   ESRD on hemodialysis ([place]) 02/16/2018   Fall 06/04/2019   GERD (gastroesophageal reflux disease) 06/04/2019   GI bleeding 05/24/2017   Gram-negative sepsis, unspecified ([place]) 09/05/2017   History of fusion of cervical spine 03/26/2020   Hyperlipidemia    Hypertension    Hypokalemia 06/04/2017   Iron deficiency anemia, unspecified 09/09/2017   Left shoulder pain 11/11/2019   Lumbar radiculopathy 11/24/2019   Lung  contusion 06/04/2019   LVH (left ventricular hypertrophy) due to hypertensive disease, with heart failure ([place]) 06/18/2020   Macrocytic anemia 05/24/2017   Moderate protein-calorie malnutrition ([place]) 06/06/2017   Myofascial pain syndrome 06/12/2020   Neuritis of right ulnar nerve 09/13/2018   Non-compliance with renal dialysis ([place]) 02/08/2020   Oxygen deficiency 12/30/2019   O2 sats on RA 87% at PAT appt    Pain in joint of right elbow 09/13/2018   Pulmonary hypertension ([place])    Renovascular hypertension 06/22/2020   Restless leg syndrome    Rib fractures 06/2019   Right   S/P cardiac cath 08/27/2020   normal coronary arteries.   Secondary hyperparathyroidism of renal origin ([name]) 06/04/2017   Thoracic ascending aortic aneurysm (HCC)    4.5 cm 06/04/19 CT   Ulnar neuropathy 10/05/2018   Volume overload 10/23/2017    Patient Active Problem List   Diagnosis Date Noted   Acute hypoxemic respiratory failure ([place]) 05/18/2021   Bronchitis 05/18/2021   Preoperative cardiovascular examination 03/06/2021   CHF (congestive heart failure) ([place]) 02/17/2021   Cellulitis of arm, left 02/01/2021   Cellulitis of left lower limb 02/01/2021   Cellulitis, unspecified 02/01/2021   Left arm swelling 01/25/2021   Acute on chronic respiratory failure ([place]) 01/13/2021   Acute on chronic respiratory failure with hypoxia ([place]) 01/12/2021   Concussion with no loss of consciousness 01/12/2021   Laceration without foreign body of left forearm,  initial encounter 01/12/2021   Age-related physical debility 09/20/2020   Allergy, unspecified, initial encounter 09/20/2020   Nausea 09/20/2020   Pain, unspecified 09/20/2020   Pruritus, unspecified 09/20/2020   S/P cardiac cath 08/27/2020   Acute encephalopathy 08/26/2020   Peripheral neuropathy 08/26/2020   Elevated troponin 08/25/2020   Hyperkalemia 08/05/2020   Arthritis    Asthma    Cataract    Cerebral infarction due to thrombosis of cerebral  artery (HCC)    History of CVA (cerebrovascular accident)    Chronic kidney disease    Dyspnea    Hyperlipidemia    Hypertension    Kidney failure    Restless leg syndrome    Degenerative lumbar spinal stenosis 73/71/0626   Diastolic dysfunction 94/85/4627   Renovascular hypertension 06/22/2020   Demand ischemia ([place]) 06/18/2020   LVH (left ventricular hypertrophy) due to hypertensive disease, with heart failure ([place]) 06/18/2020   Myofascial pain syndrome 06/12/2020   Encounter for removal of sutures 03/29/2020   History of fusion of cervical spine 03/26/2020   Non-compliance with renal dialysis ([place]) 02/08/2020   Acute pulmonary edema ([place]) 01/23/2020   Cervical disc herniation 01/04/2020   Tachycardia 01/04/2020   SOB (shortness of breath)    Hypoxia 12/30/2019   Chronic low back pain 11/24/2019   Degeneration of lumbar intervertebral disc 11/24/2019   Lumbar radiculopathy 11/24/2019   Left shoulder pain 11/11/2019   Anaphylactic shock, unspecified, sequela 06/10/2019   Rib fractures 06/05/2019   Fall at home, initial encounter 06/04/2019   Right rib fracture 06/04/2019   Lung contusion 06/04/2019   Acute respiratory failure with hypoxia ([place]) 06/04/2019   Anemia in ESRD (end-stage renal disease) ([place]) 06/04/2019   GERD without esophagitis 06/04/2019   Chronic diastolic (congestive) heart failure ([place]) 06/04/2019   Fall 06/04/2019   Thoracic ascending aortic aneurysm ([place]) 06/04/2019   Acute exacerbation of congestive heart failure ([place]) 05/16/2019   Carpal tunnel syndrome of right wrist 10/05/2018   Ulnar neuropathy 10/05/2018   Neuritis of right ulnar nerve 09/13/2018   Pain in joint of right elbow 09/13/2018   Chronic diastolic heart failure ([place]) 04/13/2018   Acute on chronic diastolic CHF (congestive heart failure) ([place]) 04/13/2018   Atypical chest pain 02/16/2018   ESRD on dialysis ([place]) 02/16/2018   ESRD (end stage renal disease) on dialysis ([place]) 02/16/2018    CAP (community acquired pneumonia) 10/23/2017   Asthma, chronic, unspecified asthma severity, with acute exacerbation 10/23/2017   Respiratory failure, acute ([place]) 10/23/2017   Pulmonary hypertension ([place]) 10/23/2017   Iron deficiency anemia, unspecified 09/09/2017   Gram-negative sepsis, unspecified ([place]) 09/05/2017   Encounter for immunization 07/08/2017   Moderate protein-calorie malnutrition ([place]) 06/06/2017   Coagulation defect, unspecified ([place]) 06/04/2017   Hypokalemia 06/04/2017   Other hyperlipidemia 06/04/2017   Secondary hyperparathyroidism of renal origin ([place]) 06/04/2017   Macrocytic anemia 05/24/2017   Uremia 05/24/2017   Hypertensive urgency 05/24/2017   Acute kidney injury superimposed on CKD ([place]) 05/24/2017   CKD (chronic kidney disease), stage V ([place]) 05/24/2017   GI bleeding 05/24/2017   Anemia of chronic disease 05/24/2017   Stroke ([place]) 2018   AKI (acute kidney injury) ([place]) 04/10/2015   History of completed stroke    Essential hypertension 04/09/2015    Past Surgical History:  Procedure Laterality Date   A/V FISTULAGRAM N/A 01/01/2021   Procedure: A/V FISTULAGRAM;  Surgeon: Serafina Mitchell, MD;  Location: [place] [facility];  Service: Cardiovascular;  Laterality: N/A;   A/V FISTULAGRAM  N/A 01/15/2021   Procedure: A/V FISTULAGRAM;  Surgeon: Serafina Mitchell, MD;  Location: [place] [facility];  Service: Cardiovascular;  Laterality: N/A;   ANTERIOR CERVICAL DECOMP/DISCECTOMY FUSION N/A 01/04/2020   Procedure: ANTERIOR CERVICAL DECOMPRESSION/DISCECTOMY FUSION CERVICAL FIVE THROUGH SEVEN;  Surgeon: Melina Schools, MD;  Location: [place];  Service: Orthopedics;  Laterality: N/A;  3 hrs   AV FISTULA PLACEMENT Left 05/28/2017   Procedure: LEFT ARM ARTERIOVENOUS (AV) FISTULA CREATION;  Surgeon: Conrad Middlesex, MD;  Location: [place];  Service: Vascular;  Laterality: Left;   AV FISTULA PLACEMENT Left 02/02/2021   Procedure: Debride left forearm, ligation of left upper arm  Aretiovenous fistula;  Surgeon: Elam Dutch, MD;  Location: [facility] OR;  Service: Vascular;  Laterality: Left;   AV FISTULA PLACEMENT Right 04/04/2021   Procedure: RIGHT ARTERIOVENOUS (AV) FISTULA CREATION;  Surgeon: Serafina Mitchell, MD;  Location: [facility] OR;  Service: Vascular;  Laterality: Right;   EYE SURGERY Right 06/02/2019   Cataract removed   FRACTURE SURGERY     INSERTION OF DIALYSIS CATHETER Right 02/02/2021   Procedure: INSERTION OF Right Internal jugular TUNNELED  DIALYSIS CATHETER;  Surgeon: Elam Dutch, MD;  Location: [place];  Service: Vascular;  Laterality: Right;   IR DIALY SHUNT INTRO NEEDLE/INTRACATH INITIAL W/IMG LEFT Left 09/12/2020   IR FLUORO GUIDE CV LINE RIGHT  05/25/2017   IR US GUIDE VASC ACCESS LEFT  09/12/2020   IR US GUIDE VASC ACCESS RIGHT  05/25/2017   LIGATION OF ARTERIOVENOUS  FISTULA Left 01/10/2021   Procedure: LIGATION OF LEFT ARM RADIOCEPHALIC FISTULA;  Surgeon: Serafina Mitchell, MD;  Location: [place];  Service: Vascular;  Laterality: Left;   PERIPHERAL VASCULAR BALLOON ANGIOPLASTY Left 01/15/2021   Procedure: PERIPHERAL VASCULAR BALLOON ANGIOPLASTY;  Surgeon: Serafina Mitchell, MD;  Location: [place] [facility];  Service: Cardiovascular;  Laterality: Left;  AVF   REVISON OF ARTERIOVENOUS FISTULA Left 07/18/2019   Procedure: REVISION PLICATION OF RADIOCEPHALIC ARTERIOVENOUS FISTULA LEFT ARM;  Surgeon: Angelia Mould, MD;  Location: [facility] OR;  Service: Vascular;  Laterality: Left;   REVISON OF ARTERIOVENOUS FISTULA Left 01/10/2021   Procedure: CONVERSION TO BRACHIOCEPHALIC ARTERIOVENOUS FISTULA;  Surgeon: Serafina Mitchell, MD;  Location: [facility] OR;  Service: Vascular;  Laterality: Left;   RINOPLASTY         Family History  Problem Relation Age of Onset   Cancer Mother     Social History   Tobacco Use   Smoking status: Never   Smokeless tobacco: Never  Vaping Use   Vaping Use: Never used  Substance Use Topics   Alcohol use: Not Currently     Alcohol/week: 0.0 standard drinks    Comment: has a drink once in a while- 12/30/19   Drug use: No    Home Medications Prior to Admission medications   Medication Sig Start Date End Date Taking? Authorizing Provider  acetaminophen (TYLENOL) 500 MG tablet Take 500 mg by mouth every 6 (six) hours as needed for mild pain, moderate pain or headache.    [provider]  albuterol (VENTOLIN HFA) 108 (90 Base) MCG/ACT inhaler Inhale 2 puffs into the lungs 2 (two) times daily as needed for shortness of breath or wheezing.    [provider]  amLODipine (NORVASC) 10 MG tablet Take 10 mg by mouth daily.     [provider]  AURYXIA 1 GM 210 MG(Fe) tablet Take 420 mg by mouth 3 (three) times daily with meals. 03/20/21  [provider]  BREO ELLIPTA 100-25 MCG/INH AEPB Inhale 1 puff into the lungs daily. 06/26/20   [provider]  cinacalcet (SENSIPAR) 60 MG tablet Take 60 mg by mouth every evening. 03/18/21   [provider]  DIPHENHYDRAMINE HCL PO Take 25 mg by mouth as needed (allergies). Patient not taking: Reported on 05/18/2021 09/24/20 09/23/21  [provider]  doxercalciferol (HECTOROL) 0.5 MCG capsule Take 0.5 mcg by mouth 3 (three) times a week. 11/26/20 11/25/21  [provider]  furosemide (LASIX) 80 MG tablet Take 80 mg by mouth See admin instructions. Take 80 mg by mouth daily as needed/as directed on non-dialysis days of Sun/Tues/Thurs/Sat    [provider]  hydrALAZINE (APRESOLINE) 25 MG tablet Take 50 mg by mouth in the morning and at bedtime.    [provider]  metoprolol tartrate (LOPRESSOR) 100 MG tablet Take 0.5 tablets (50 mg total) by mouth 2 (two) times daily. 01/15/21   Alma Friendly, MD  multivitamin (RENA-VIT) TABS tablet Take 1 tablet by mouth daily. 01/12/18   [provider]  pregabalin (LYRICA) 25 MG capsule Take 25 mg by mouth 3 (three) times daily with meals. 11/29/20    [provider]  sevelamer carbonate (RENVELA) 800 MG tablet Take 800 mg by mouth 3 (three) times daily with meals. 11/07/19   [provider]  traMADol (ULTRAM) 50 MG tablet Take 1 tablet (50 mg total) by mouth every 6 (six) hours as needed. Patient taking differently: Take 50 mg by mouth every 6 (six) hours as needed (for pain). 04/04/21 04/04/22  Baglia, Corrina, PA-C    Allergies    Vancomycin  Review of Systems   Review of Systems  Constitutional:  Negative for appetite change.  HENT:  Negative for congestion.   Respiratory:  Positive for cough and shortness of breath.   Cardiovascular:  Positive for chest pain.  Gastrointestinal:  Negative for abdominal pain.  Genitourinary:  Negative for flank pain.  Musculoskeletal:  Negative for back pain.  Skin:  Negative for rash.  Neurological:  Negative for weakness.  Psychiatric/Behavioral:  Negative for confusion.    Physical Exam Updated Vital Signs BP (!) 199/107   Pulse 69   Temp 98.2 F (36.8 C) (Oral)   Resp 15   SpO2 97%   Physical Exam Vitals and nursing note reviewed.  Constitutional:      Appearance: He is well-developed.  HENT:     Head: Normocephalic.  Cardiovascular:     Rate and Rhythm: Normal rate and regular rhythm.  Pulmonary:     Breath sounds: Rales present.     Comments: Rales bilateral bases. Chest:     Chest wall: No tenderness.     Comments: Dialysis catheter right chest wall. Abdominal:     Tenderness: There is no abdominal tenderness.  Musculoskeletal:     Cervical back: Neck supple.     Right lower leg: No tenderness. No edema.     Left lower leg: No tenderness. No edema.  Skin:    General: Skin is warm.     Capillary Refill: Capillary refill takes less than 2 seconds.  Neurological:     Mental Status: He is alert.    ED Results / Procedures / Treatments   Labs (all labs ordered are listed, but only abnormal results are displayed) Labs Reviewed  COMPREHENSIVE METABOLIC  PANEL - Abnormal; Notable for the following components:      Result Value   BUN 26 (*)  Creatinine, Ser 5.65 (*)    Calcium 10.4 (*)    Albumin 3.3 (*)    Total Bilirubin 1.3 (*)    GFR, Estimated 11 (*)    Anion gap 16 (*)    All other components within normal limits  CBC WITH DIFFERENTIAL/PLATELET - Abnormal; Notable for the following components:   WBC 3.4 (*)    RBC 3.08 (*)    Hemoglobin 10.1 (*)    HCT 32.2 (*)    MCV 104.5 (*)    Platelets 143 (*)    Lymphs Abs 0.6 (*)    All other components within normal limits  TROPONIN I (HIGH SENSITIVITY) - Abnormal; Notable for the following components:   Troponin I (High Sensitivity) 79 (*)    All other components within normal limits  TROPONIN I (HIGH SENSITIVITY) - Abnormal; Notable for the following components:   Troponin I (High Sensitivity) 81 (*)    All other components within normal limits  RESP PANEL BY RT-PCR (FLU A&B, COVID) [id number]    EKG None  Radiology DG Chest 2 View  Result Date: 05/30/2021 CLINICAL DATA:  Chest pain. EXAM: CHEST - 2 VIEW COMPARISON:  CT angiogram chest 05/18/2021. FINDINGS: Right-sided central venous catheter tip projects over the distal SVC. The heart is enlarged, unchanged. There are hazy opacities in both lungs. There is a stable small right pleural effusion. There some strandy opacities in the left lung base. There is no pneumothorax. Cervical spinal fusion plate is again noted. No acute fractures are seen. IMPRESSION: 1. Cardiomegaly. 2. Hazy bilateral opacities may represent mild edema. 3. Stable small right pleural effusion. 4. Left basilar atelectasis. Electronically Signed   By: Ronney Asters M.D.   On: 05/30/2021 17:51   CT Head Wo Contrast  Result Date: 05/30/2021 CLINICAL DATA:  Headache.  Hypertension. EXAM: CT HEAD WITHOUT CONTRAST TECHNIQUE: Contiguous axial images were obtained from the base of the skull through the vertex without intravenous contrast. COMPARISON:  02/18/2021  FINDINGS: Brain: No evidence of acute infarction, hemorrhage, hydrocephalus, extra-axial collection or mass lesion/mass effect. Remote right cerebellar hemisphere infarct. There is mild diffuse low-attenuation within the subcortical and periventricular white matter compatible with chronic microvascular disease. Vascular: No hyperdense vessel or unexpected calcification. Skull: Normal. Negative for fracture or focal lesion. Sinuses/Orbits: Mild mucosal thickening within the maxillary sinuses. Other: None IMPRESSION: 1. No acute intracranial abnormalities. 2. Chronic small vessel ischemic disease and brain atrophy. Electronically Signed   By: Kerby Moors M.D.   On: 05/30/2021 18:47    Procedures Procedures   Medications Ordered in ED Medications  hydrALAZINE (APRESOLINE) injection 20 mg (20 mg Intravenous Given 05/30/21 2148)    ED Course  I have reviewed the triage vital signs and the nursing notes.  Pertinent labs & imaging results that were available during my care of the patient were reviewed by me and considered in my medical decision making (see chart for details).    MDM Rules/Calculators/A&P                           Patient with shortness of breath.  Hypertension.  Had not done his full course of dialysis.  Has sats will go down in the 80s.  Appears somewhat volume overload on x-ray.  Troponin mildly elevated but at baseline.  EKG reassuring.  However with chest pain volume overload and hypoxia I feel patient benefit admission to the hospital.  Will likely need dialysis tomorrow but do  not think he needs to done emergently tonight.  Given IV hydralazine now for better blood pressure control  CRITICAL CARE Performed by: Davonna Belling Total critical care time: 30 minutes Critical care time was exclusive of separately billable procedures and treating other patients. Critical care was necessary to treat or prevent imminent or life-threatening deterioration. Critical care was time  spent personally by me on the following activities: development of treatment plan with patient and/or surrogate as well as nursing, discussions with consultants, evaluation of patient's response to treatment, examination of patient, obtaining history from patient or surrogate, ordering and performing treatments and interventions, ordering and review of laboratory studies, ordering and review of radiographic studies, pulse oximetry and re-evaluation of patient's condition.  Final Clinical Impression(s) / ED Diagnoses Final diagnoses:  Chest pain, unspecified type  End stage renal disease ([place])  Hypertension, unspecified type  Hypervolemia, unspecified hypervolemia type    Rx / DC Orders ED Discharge Orders     None        Davonna Belling, MD 05/30/21 2210

## 2021-05-30 NOTE — ED Provider Notes (Signed)
Emergency Medicine Provider Triage Evaluation Note  Thomas Robinson , a 60 y.o. male  was evaluated in triage.  Pt complains of chest pain that began intermittently over the last couple of days.  He states that his blood pressures been running high at home and he has not been feeling "right ."  He reports associated headache, blurred vision, and shortness of breath but denies any abdominal pain, nausea, vomiting, diarrhea, urinary complaints, leg pain, leg swelling.  Review of Systems  Positive:  Negative: See above  Physical Exam  BP (!) 229/117 (BP Location: Left Arm)   Pulse 71   Temp 98.2 F (36.8 C) (Oral)   Resp 16   SpO2 94%  Gen:   Awake, no distress   Resp:  Normal effort  MSK:   Moves extremities without difficulty  Other:    Medical Decision Making  Medically screening exam initiated at 5:05 PM.  Appropriate orders placed.  [name] was informed that the remainder of the evaluation will be completed by another provider, this initial triage assessment does not replace that evaluation, and the importance of remaining in the ED until their evaluation is complete.     Hendricks Limes, PA-C 05/30/21 1706    Davonna Belling, MD 05/30/21 2235

## 2021-05-30 NOTE — ED Notes (Signed)
ED Provider at bedside. 

## 2021-05-30 NOTE — ED Notes (Addendum)
Pt called out and states he is having worse chest pressure to left side of chest, Rates 10/10. Pt states NTG helped the pain when EMS administered it. MD notified, waiting for orders. No noted EKG changes but BP has improved. Will continue to monitor.

## 2021-05-31 ENCOUNTER — Encounter (HOSPITAL_COMMUNITY): Payer: Medicare Other

## 2021-05-31 DIAGNOSIS — I160 Hypertensive urgency: Secondary | ICD-10-CM | POA: Diagnosis not present

## 2021-05-31 DIAGNOSIS — E8770 Fluid overload, unspecified: Secondary | ICD-10-CM | POA: Diagnosis not present

## 2021-05-31 DIAGNOSIS — R079 Chest pain, unspecified: Secondary | ICD-10-CM | POA: Diagnosis not present

## 2021-05-31 DIAGNOSIS — N186 End stage renal disease: Secondary | ICD-10-CM | POA: Diagnosis not present

## 2021-05-31 LAB — BASIC METABOLIC PANEL
Anion gap: 16 — ABNORMAL HIGH (ref 5–15)
BUN: 28 mg/dL — ABNORMAL HIGH (ref 8–23)
CO2: 26 mmol/L (ref 22–32)
Calcium: 10.5 mg/dL — ABNORMAL HIGH (ref 8.9–10.3)
Chloride: 99 mmol/L (ref 98–111)
Creatinine, Ser: 6.08 mg/dL — ABNORMAL HIGH (ref 0.61–1.24)
GFR, Estimated: 10 mL/min — ABNORMAL LOW (ref >60)
Glucose, Bld: 90 mg/dL (ref 70–99)
Potassium: 3.8 mmol/L (ref 3.5–5.1)
Sodium: 141 mmol/L (ref 135–145)

## 2021-05-31 LAB — CBC
HCT: 30.8 % — ABNORMAL LOW (ref 39.0–52.0)
HCT: 33.6 % — ABNORMAL LOW (ref 39.0–52.0)
Hemoglobin: 10.7 g/dL — ABNORMAL LOW (ref 13.0–17.0)
Hemoglobin: 9.8 g/dL — ABNORMAL LOW (ref 13.0–17.0)
MCH: 32.9 pg (ref 26.0–34.0)
MCH: 33 pg (ref 26.0–34.0)
MCHC: 31.8 g/dL (ref 30.0–36.0)
MCHC: 31.8 g/dL (ref 30.0–36.0)
MCV: 103.4 fL — ABNORMAL HIGH (ref 80.0–100.0)
MCV: 103.7 fL — ABNORMAL HIGH (ref 80.0–100.0)
Platelets: 124 10*3/uL — ABNORMAL LOW (ref 150–400)
Platelets: 131 10*3/uL — ABNORMAL LOW (ref 150–400)
RBC: 2.97 MIL/uL — ABNORMAL LOW (ref 4.22–5.81)
RBC: 3.25 MIL/uL — ABNORMAL LOW (ref 4.22–5.81)
RDW: 15 % (ref 11.5–15.5)
RDW: 15 % (ref 11.5–15.5)
WBC: 3.6 10*3/uL — ABNORMAL LOW (ref 4.0–10.5)
WBC: 4.4 10*3/uL (ref 4.0–10.5)
nRBC: 0 % (ref 0.0–0.2)
nRBC: 0 % (ref 0.0–0.2)

## 2021-05-31 LAB — CREATININE, SERUM
Creatinine, Ser: 6.36 mg/dL — ABNORMAL HIGH (ref 0.61–1.24)
GFR, Estimated: 9 mL/min — ABNORMAL LOW (ref >60)

## 2021-05-31 LAB — HEPATITIS B SURFACE ANTIBODY,QUALITATIVE: Hep B S Ab: NONREACTIVE

## 2021-05-31 LAB — HEPATITIS B SURFACE ANTIGEN: Hepatitis B Surface Ag: NONREACTIVE

## 2021-05-31 MED ORDER — ALTEPLASE 2 MG IJ SOLR: 2.0000 mg | Freq: Once | INTRAMUSCULAR | Status: DC | PRN

## 2021-05-31 MED ORDER — HEPARIN SODIUM (PORCINE) 1000 UNIT/ML IJ SOLN
INTRAMUSCULAR | Status: AC
  Filled 2021-05-31: qty 4

## 2021-05-31 MED ORDER — CHLORHEXIDINE GLUCONATE CLOTH 2 % EX PADS: 6.0000 | MEDICATED_PAD | Freq: Every day | CUTANEOUS | Status: DC

## 2021-05-31 MED ORDER — HEPARIN SODIUM (PORCINE) 1000 UNIT/ML DIALYSIS
1000.0000 [IU] | INTRAMUSCULAR | Status: DC | PRN
  Filled 2021-05-31: qty 1

## 2021-05-31 MED ORDER — SODIUM CHLORIDE 0.9 % IV SOLN: 100.0000 mL | INTRAVENOUS | Status: DC | PRN

## 2021-05-31 NOTE — Progress Notes (Addendum)
Pt receives out pt HD at [facility] MWF with 12:00 arrival time and 12:15 start time. Pt can receive treatment at clinic today but will need to be at clinic by 12:00. Spoke to [place] at [facility]. Katie with [facility] made aware of this info. Update provided to attending and RN via secure chat.   Melven Sartorius Renal Navigator  [phone]  Addendum at 10:10 am: Per renal PA, pt will not d/c today and will receive treatment here. Notified clinic.

## 2021-05-31 NOTE — ED Notes (Signed)
Pt resting on stretcher with eyes closed, respirations even and unlabored. Pt has call bell within reach, side rails up x2, lights off, urinal at bedside. No acute changes noted. Will continue to monitor.

## 2021-05-31 NOTE — ED Notes (Signed)
Pts O2 is 94 and not 88%

## 2021-05-31 NOTE — ED Notes (Signed)
Pt back to room via wheelchair due to feeling too weak to walk back to his room.

## 2021-05-31 NOTE — Progress Notes (Signed)
Patient new admit from ER, Right chest temp dialysis cath with no dressing on place. As soon as patient got to floor dialysis called for report and stated she would do dressing change.   Jakaiya Netherland, Tivis Ringer, RN

## 2021-05-31 NOTE — Progress Notes (Signed)
[facility]  PROGRESS NOTE  Thomas Robinson IEP:[id number] DOB: June 15, 1961 DOA: 05/30/2021 PCP: [name], PA   Brief HPI:   60 year old male with history of ESRD on hemodialysis Monday Wednesday Friday, hypertension, ascending thoracic aortic aneurysm, anemia, recent lumbar surgery was recently in the hospital and signed out AMA came to hospital with chest pressure over past 3 days.  Patient states that he has episodic chest pressure with no associated productive cough, fever or chills.  He had gone to dialysis yesterday but was unable to complete the full session because of low back pain. In the ED he was found to have systolic blood pressure of 230 for which he received IV hydralazine 20 mg and sublingual nitroglycerin.  Chest pressure improved after that.  Chest x-ray showed fluid overload.  SARS-CoV-2 RT-PCR was negative.  Patient was mated with chest pressure with hypertensive urgency.    Subjective   Denies chest pain or shortness of breath at this time.   Assessment/Plan:    Hypertensive urgency -Resolved -Patient states that he ran out of medications for almost a month -Home medication including amlodipine, hydralazine and metoprolol has been restarted -Chest pressure has resolved  Chest pain -Likely from hypertensive urgency -Patient received nitroglycerin yesterday -Troponin was 79, 81.   -Likely demand ischemia from above  ESRD on hemodialysis -Patient hemodialysis MWF -Nephrology has been consulted for hemodialysis today  History of ascending thoracic arctic aneurysm -Chest pain has resolved -If pain recurs will need further imaging with CTA  Grade 1 diastolic dysfunction -Still requiring 2 L/min of oxygen via [place] -Hopefully breathing should improve after dialysis   Recent back surgery -Stable    Data Reviewed:   CBG:  No results for input(s): GLUCAP in the last 168 hours.  SpO2: 94 % O2 Flow Rate (L/min): 2 L/min    Vitals:    05/31/21 0740 05/31/21 0900 05/31/21 1100 05/31/21 1200  BP: (!) 171/71 (!) 139/57 (!) 166/68 (!) 121/108  Pulse: 97 (!) 116 74 81  Resp: 16 (!) 22 (!) 22 (!) 30  Temp: 98 F (36.7 C)     TempSrc: Oral     SpO2: 93% 93% 92% 94%    No intake or output data in the 24 hours ending 05/31/21 1238  No intake/output data recorded.  There were no vitals filed for this visit.  Data Reviewed: Basic Metabolic Panel: Recent Labs  Lab 05/30/21 1700 05/31/21 0022 05/31/21 0352  NA 141 141  --   K 3.8 3.8  --   CL 99 99  --   CO2 26 26  --   GLUCOSE 87 90  --   BUN 26* 28*  --   CREATININE 5.65* 6.08* 6.36*  CALCIUM 10.4* 10.5*  --    Liver Function Tests: Recent Labs  Lab 05/30/21 1700  AST 22  ALT 11  ALKPHOS 102  BILITOT 1.3*  PROT 6.6  ALBUMIN 3.3*   No results for input(s): LIPASE, AMYLASE in the last 168 hours. No results for input(s): AMMONIA in the last 168 hours. CBC: Recent Labs  Lab 05/30/21 1700 05/31/21 0022 05/31/21 0352  WBC 3.4* 3.6* 4.4  NEUTROABS 2.3  --   --   HGB 10.1* 10.7* 9.8*  HCT 32.2* 33.6* 30.8*  MCV 104.5* 103.4* 103.7*  PLT 143* 131* 124*   Cardiac Enzymes: No results for input(s): CKTOTAL, CKMB, CKMBINDEX, TROPONINI in the last 168 hours. BNP (last 3 results) Recent Labs    09/18/20 0635 02/18/21 0107  03/29/21 0205  BNP 4,033.1* 4,369.1* >4,500.0*    ProBNP (last 3 results) No results for input(s): PROBNP in the last 8760 hours.  CBG: No results for input(s): GLUCAP in the last 168 hours.  Recent Results (from the past 240 hour(s))  Resp Panel by RT-PCR (Flu A&B, Covid) Nasopharyngeal Swab     Status: None   Collection Time: 05/30/21  8:21 PM   Specimen: Nasopharyngeal Swab; Nasopharyngeal(NP) swabs in vial transport medium  Result Value Ref Range Status   SARS Coronavirus 2 by RT PCR NEGATIVE NEGATIVE Final    Comment: (NOTE) SARS-CoV-2 target nucleic acids are NOT DETECTED.  The SARS-CoV-2 RNA is generally detectable  in upper respiratory specimens during the acute phase of infection. The lowest concentration of SARS-CoV-2 viral copies this assay can detect is 138 copies/mL. A negative result does not preclude SARS-Cov-2 infection and should not be used as the sole basis for treatment or other patient management decisions. A negative result may occur with  improper specimen collection/handling, submission of specimen other than nasopharyngeal swab, presence of viral mutation(s) within the areas targeted by this assay, and inadequate number of viral copies(<138 copies/mL). A negative result must be combined with clinical observations, patient history, and epidemiological information. The expected result is Negative.  Fact Sheet for Patients:  EntrepreneurPulse.com.au  Fact Sheet for Healthcare Providers:  IncredibleEmployment.be  This test is no t yet approved or cleared by the [place] FDA and  has been authorized for detection and/or diagnosis of SARS-CoV-2 by FDA under an Emergency Use Authorization (EUA). This EUA will remain  in effect (meaning this test can be used) for the duration of the COVID-19 declaration under Section 564(b)(1) of the Act, 21 U.S.C.section 360bbb-3(b)(1), unless the authorization is terminated  or revoked sooner.       Influenza A by PCR NEGATIVE NEGATIVE Final   Influenza B by PCR NEGATIVE NEGATIVE Final    Comment: (NOTE) The Xpert Xpress SARS-CoV-2/FLU/RSV plus assay is intended as an aid in the diagnosis of influenza from Nasopharyngeal swab specimens and should not be used as a sole basis for treatment. Nasal washings and aspirates are unacceptable for Xpert Xpress SARS-CoV-2/FLU/RSV testing.  Fact Sheet for Patients: EntrepreneurPulse.com.au  Fact Sheet for Healthcare Providers: IncredibleEmployment.be  This test is not yet approved or cleared by the [place] FDA and has  been authorized for detection and/or diagnosis of SARS-CoV-2 by FDA under an Emergency Use Authorization (EUA). This EUA will remain in effect (meaning this test can be used) for the duration of the COVID-19 declaration under Section 564(b)(1) of the Act, 21 U.S.C. section 360bbb-3(b)(1), unless the authorization is terminated or revoked.  Performed at [facility], [place] [address], [place], [place] [address]      Radiology Reports  DG Chest 2 View  Result Date: 05/30/2021 CLINICAL DATA:  Chest pain. EXAM: CHEST - 2 VIEW COMPARISON:  CT angiogram chest 05/18/2021. FINDINGS: Right-sided central venous catheter tip projects over the distal SVC. The heart is enlarged, unchanged. There are hazy opacities in both lungs. There is a stable small right pleural effusion. There some strandy opacities in the left lung base. There is no pneumothorax. Cervical spinal fusion plate is again noted. No acute fractures are seen. IMPRESSION: 1. Cardiomegaly. 2. Hazy bilateral opacities may represent mild edema. 3. Stable small right pleural effusion. 4. Left basilar atelectasis. Electronically Signed   By: Ronney Asters M.D.   On: 05/30/2021 17:51   CT Head Wo Contrast  Result Date: 05/30/2021 CLINICAL DATA:  Headache.  Hypertension. EXAM: CT HEAD WITHOUT CONTRAST TECHNIQUE: Contiguous axial images were obtained from the base of the skull through the vertex without intravenous contrast. COMPARISON:  02/18/2021 FINDINGS: Brain: No evidence of acute infarction, hemorrhage, hydrocephalus, extra-axial collection or mass lesion/mass effect. Remote right cerebellar hemisphere infarct. There is mild diffuse low-attenuation within the subcortical and periventricular white matter compatible with chronic microvascular disease. Vascular: No hyperdense vessel or unexpected calcification. Skull: Normal. Negative for fracture or focal lesion. Sinuses/Orbits: Mild mucosal thickening within the maxillary sinuses. Other: None  IMPRESSION: 1. No acute intracranial abnormalities. 2. Chronic small vessel ischemic disease and brain atrophy. Electronically Signed   By: Kerby Moors M.D.   On: 05/30/2021 18:47     Scheduled medications:    amLODipine  10 mg Oral Daily   Chlorhexidine Gluconate Cloth  6 each Topical Q0600   fluticasone furoate-vilanterol  1 puff Inhalation Daily   heparin  5,000 Units Subcutaneous Q8H   hydrALAZINE  50 mg Oral Q8H   metoprolol tartrate  50 mg Oral BID   multivitamin  1 tablet Oral QHS   pregabalin  25 mg Oral TID WC    Antibiotics: Anti-infectives (From admission, onward)    None         DVT prophylaxis: Heparin  Code Status: Full code  Family Communication: No family at bedside   Consultants: Nephrology  Procedures:     Objective    Physical Examination:   General-appears in no acute distress Heart-S1-S2, regular, no murmur auscultated Lungs-clear to auscultation bilaterally, no wheezing or crackles auscultated Abdomen-soft, nontender, no organomegaly Extremities-no edema in the lower extremities Neuro-alert, oriented x3, no focal deficit noted  Status is: Inpatient  Dispo: The patient is from: Home              Anticipated d/c is to: Home              Anticipated d/c date is: 06/01/2021              Patient currently not stable for discharge  Barrier to discharge-ongoing management for hypertensive urgency  COVID-19 Labs  No results for input(s): DDIMER, FERRITIN, LDH, CRP in the last 72 hours.  Lab Results  Component Value Date   SARSCOV2NAA NEGATIVE 05/30/2021   [place] NEGATIVE 05/18/2021   [place] NEGATIVE 03/29/2021   [place] NEGATIVE 02/18/2021              [place]   [facility] If 7PM-7AM, please contact night-coverage at [email or web address], Office  [phone]   05/31/2021, 12:38 PM  LOS: 0 days

## 2021-05-31 NOTE — ED Notes (Signed)
Pt ambulatory to the bathroom in the hallway with 1 person assistance. No acute changes noted. Will continue to monitor.

## 2021-05-31 NOTE — Consult Note (Signed)
[facility] Renal Consultation Note    Indication for Consultation:  Management of ESRD/hemodialysis, anemia, hypertension/volume, and secondary hyperparathyroidism. PCP:  HPI: Thomas Robinson is a 60 y.o. male with ESRD, HTN, Hx CVA, HL, Hx thoracic ascending aortic aneurysm, and chronic back pain (s/p L4-S1 laminectomy 04/25/21) who has been admitted with dyspnea/chest pain and HTN urgency.  Presented to ED via last evening with CP which was improved with SL NTG + ASA. Noted to be very hypertensive on arrival and reported that had been out of all of his medications for a few weeks. Also endorsed intermittent dyspnea, especially with exertion, cough, and weakness. Denies N/V/D or abdominal pain, no fever or chills. Labs showing N 141, K 3.8, Ca 10.5, WBC 3.6, Hgb 10.7, Plts 131. CXR with pulm edema and B effusions. EKG without acute ST changes. Trop 79 -> 81. Was given hydralazine, amlodipine, and metoprolol and BP much improved now. Our team was consulted for dialysis.  Dialyzes at [facility] on MWF schedule. Last HD was on 9/28, completed 2hr 9min of 4hr treatment. Has been meeting his dry weight recently. Uses TDC as access, has RUE 1st stage BVT (placed 04/04/21) appears to be maturing well.  Past Medical History:  Diagnosis Date   Anaphylactic shock, unspecified, sequela 06/10/2019   Aortic stenosis    mild-moderate AS 12/2020 echo   Arthritis    Asthma, chronic, unspecified asthma severity, with acute exacerbation 10/23/2017   CAP (community acquired pneumonia) 10/23/2017   Carpal tunnel syndrome of right wrist 10/05/2018   Cataract    right - removed by surgery   Cerebral infarction due to thrombosis of cerebral artery (HCC)    Cervical disc herniation 01/04/2020   Chronic diastolic heart failure ([place]) 04/13/2018   Chronic low back pain 11/24/2019   Constipation    Cough    chronic cough   Dyspnea    Encounter for immunization 07/08/2017   ESRD on hemodialysis  ([place]) 02/16/2018   Fall 06/04/2019   GERD (gastroesophageal reflux disease) 06/04/2019   GI bleeding 05/24/2017   Gram-negative sepsis, unspecified ([place]) 09/05/2017   History of fusion of cervical spine 03/26/2020   Hyperlipidemia    Hypertension    Hypokalemia 06/04/2017   Iron deficiency anemia, unspecified 09/09/2017   Left shoulder pain 11/11/2019   Lumbar radiculopathy 11/24/2019   Lung contusion 06/04/2019   LVH (left ventricular hypertrophy) due to hypertensive disease, with heart failure ([place]) 06/18/2020   Macrocytic anemia 05/24/2017   Moderate protein-calorie malnutrition ([place]) 06/06/2017   Myofascial pain syndrome 06/12/2020   Neuritis of right ulnar nerve 09/13/2018   Non-compliance with renal dialysis ([place]) 02/08/2020   Oxygen deficiency 12/30/2019   O2 sats on RA 87% at PAT appt    Pain in joint of right elbow 09/13/2018   Pulmonary hypertension ([place])    Renovascular hypertension 06/22/2020   Restless leg syndrome    Rib fractures 06/2019   Right   S/P cardiac cath 08/27/2020   normal coronary arteries.   Secondary hyperparathyroidism of renal origin ([place]) 06/04/2017   Thoracic ascending aortic aneurysm (HCC)    4.5 cm 06/04/19 CT   Ulnar neuropathy 10/05/2018   Volume overload 10/23/2017   Past Surgical History:  Procedure Laterality Date   A/V FISTULAGRAM N/A 01/01/2021   Procedure: A/V FISTULAGRAM;  Surgeon: Serafina Mitchell, MD;  Location: [place];  Service: Cardiovascular;  Laterality: N/A;   A/V FISTULAGRAM N/A 01/15/2021   Procedure: A/V FISTULAGRAM;  Surgeon: Trula Slade,  Butch Penny, MD;  Location: [facility] [facility];  Service: Cardiovascular;  Laterality: N/A;   ANTERIOR CERVICAL DECOMP/DISCECTOMY FUSION N/A 01/04/2020   Procedure: ANTERIOR CERVICAL DECOMPRESSION/DISCECTOMY FUSION CERVICAL FIVE THROUGH SEVEN;  Surgeon: Melina Schools, MD;  Location: [facility];  Service: Orthopedics;  Laterality: N/A;  3 hrs   AV FISTULA PLACEMENT Left 05/28/2017    Procedure: LEFT ARM ARTERIOVENOUS (AV) FISTULA CREATION;  Surgeon: Conrad Warrenton, MD;  Location: [facility];  Service: Vascular;  Laterality: Left;   AV FISTULA PLACEMENT Left 02/02/2021   Procedure: Debride left forearm, ligation of left upper arm Aretiovenous fistula;  Surgeon: Elam Dutch, MD;  Location: [facility];  Service: Vascular;  Laterality: Left;   AV FISTULA PLACEMENT Right 04/04/2021   Procedure: RIGHT ARTERIOVENOUS (AV) FISTULA CREATION;  Surgeon: Serafina Mitchell, MD;  Location: [facility];  Service: Vascular;  Laterality: Right;   EYE SURGERY Right 06/02/2019   Cataract removed   FRACTURE SURGERY     INSERTION OF DIALYSIS CATHETER Right 02/02/2021   Procedure: INSERTION OF Right Internal jugular TUNNELED  DIALYSIS CATHETER;  Surgeon: Elam Dutch, MD;  Location: [facility];  Service: Vascular;  Laterality: Right;   IR DIALY SHUNT INTRO NEEDLE/INTRACATH INITIAL W/IMG LEFT Left 09/12/2020   IR FLUORO GUIDE CV LINE RIGHT  05/25/2017   IR US GUIDE VASC ACCESS LEFT  09/12/2020   IR US GUIDE VASC ACCESS RIGHT  05/25/2017   LIGATION OF ARTERIOVENOUS  FISTULA Left 01/10/2021   Procedure: LIGATION OF LEFT ARM RADIOCEPHALIC FISTULA;  Surgeon: Serafina Mitchell, MD;  Location: [place] [facility];  Service: Vascular;  Laterality: Left;   PERIPHERAL VASCULAR BALLOON ANGIOPLASTY Left 01/15/2021   Procedure: PERIPHERAL VASCULAR BALLOON ANGIOPLASTY;  Surgeon: Serafina Mitchell, MD;  Location: [facility];  Service: Cardiovascular;  Laterality: Left;  AVF   REVISON OF ARTERIOVENOUS FISTULA Left 07/18/2019   Procedure: REVISION PLICATION OF RADIOCEPHALIC ARTERIOVENOUS FISTULA LEFT ARM;  Surgeon: Angelia Mould, MD;  Location: [facility];  Service: Vascular;  Laterality: Left;   REVISON OF ARTERIOVENOUS FISTULA Left 01/10/2021   Procedure: CONVERSION TO BRACHIOCEPHALIC ARTERIOVENOUS FISTULA;  Surgeon: Serafina Mitchell, MD;  Location: [facility];  Service: Vascular;  Laterality: Left;   RINOPLASTY     Family History  Problem  Relation Age of Onset   Cancer Mother    Social History:  reports that he has never smoked. He has never used smokeless tobacco. He reports that he does not currently use alcohol. He reports that he does not use drugs.  ROS: As per HPI otherwise negative.  Physical Exam: Vitals:   05/31/21 0600 05/31/21 0700 05/31/21 0740 05/31/21 0900  BP: (!) 157/92 (!) 162/67 (!) 171/71 (!) 139/57  Pulse: 80 84 97 (!) 116  Resp: (!) 32 14 16 (!) 22  Temp:   98 F (36.7 C)   TempSrc:   Oral   SpO2: 97% 94% 93% 93%     General: Frail, well appearing man. Increased work of breathing with talking, on nasal O2. Head: Normocephalic, atraumatic, sclera non-icteric, mucus membranes are moist. Neck: Supple without lymphadenopathy/masses. JVD not elevated. Lungs: Clear bilaterally to auscultation without wheezes, rales, or rhonchi.  Heart: RRR; 3/6 murmur Abdomen: Soft, non-tender, non-distended with normoactive bowel sounds. Musculoskeletal:  Strength and tone appear normal for age. Lower extremities: No edema or ischemic changes, no open wounds. Neuro: Alert and oriented X 3. Moves all extremities spontaneously. Psych:  Responds to questions appropriately with a normal affect.  Dialysis Access: TDC in R chest, RUE 1st stage BVT + thrill  Allergies  Allergen Reactions   Vancomycin Shortness Of Breath and Itching   Prior to Admission medications   Medication Sig Start Date End Date Taking? Authorizing Provider  acetaminophen (TYLENOL) 500 MG tablet Take 500 mg by mouth every 6 (six) hours as needed for mild pain, moderate pain or headache.   Yes [provider]  albuterol (VENTOLIN HFA) 108 (90 Base) MCG/ACT inhaler Inhale 2 puffs into the lungs 2 (two) times daily as needed for shortness of breath or wheezing.   Yes [provider]  AURYXIA 1 GM 210 MG(Fe) tablet Take 420 mg by mouth 3 (three) times daily with meals. 03/20/21  Yes [provider]  BREO ELLIPTA 100-25 MCG/INH  AEPB Inhale 1 puff into the lungs daily. 06/26/20  Yes [provider]  cinacalcet (SENSIPAR) 60 MG tablet Take 60 mg by mouth every evening. 03/18/21  Yes [provider]  DIPHENHYDRAMINE HCL PO Take 25 mg by mouth as needed (allergies). 09/24/20 09/23/21 Yes [provider]  multivitamin (RENA-VIT) TABS tablet Take 1 tablet by mouth daily. 01/12/18  Yes [provider]  amLODipine (NORVASC) 10 MG tablet Take 10 mg by mouth daily.  Patient not taking: Reported on 05/31/2021    [provider]  doxercalciferol (HECTOROL) 0.5 MCG capsule Take 0.5 mcg by mouth 3 (three) times a week. 11/26/20 11/25/21  [provider]  furosemide (LASIX) 80 MG tablet Take 80 mg by mouth See admin instructions. Take 80 mg by mouth daily as needed/as directed on non-dialysis days of Sun/Tues/Thurs/Sat Patient not taking: Reported on 05/31/2021    [provider]  hydrALAZINE (APRESOLINE) 25 MG tablet Take 50 mg by mouth in the morning and at bedtime. Patient not taking: Reported on 05/31/2021    [provider]  metoprolol tartrate (LOPRESSOR) 100 MG tablet Take 0.5 tablets (50 mg total) by mouth 2 (two) times daily. Patient not taking: Reported on 05/31/2021 01/15/21   Alma Friendly, MD  pregabalin (LYRICA) 25 MG capsule Take 25 mg by mouth 3 (three) times daily with meals. Patient not taking: Reported on 05/31/2021 11/29/20   [provider]  traMADol (ULTRAM) 50 MG tablet Take 1 tablet (50 mg total) by mouth every 6 (six) hours as needed. Patient not taking: Reported on 05/31/2021 04/04/21 04/04/22  Karoline Caldwell, PA-C   Current Facility-Administered Medications  Medication Dose Route Frequency Provider Last Rate Last Admin   albuterol (PROVENTIL) (2.5 MG/3ML) 0.083% nebulizer solution 2.5 mg  2.5 mg Inhalation BID PRN Rise Patience, MD       amLODipine (NORVASC) tablet 10 mg  10 mg Oral Daily Rise Patience, MD   10 mg at  05/31/21 0920   Chlorhexidine Gluconate Cloth 2 % PADS 6 each  6 each Topical Q0600 Loren Racer, PA-C       fluticasone furoate-vilanterol (BREO ELLIPTA) 100-25 MCG/INH 1 puff  1 puff Inhalation Daily Rise Patience, MD       furosemide (LASIX) tablet 80 mg  80 mg Oral Daily PRN Rise Patience, MD       heparin injection 5,000 Units  5,000 Units Subcutaneous Q8H Rise Patience, MD   5,000 Units at 05/31/21 7412   hydrALAZINE (APRESOLINE) injection 10 mg  10 mg Intravenous Q4H PRN Rise Patience, MD       hydrALAZINE (APRESOLINE) tablet 50 mg  50 mg Oral Q8H Gean Birchwood  N, MD   50 mg at 05/31/21 0626   metoprolol tartrate (LOPRESSOR) tablet 50 mg  50 mg Oral BID Rise Patience, MD   50 mg at 05/31/21 6063   multivitamin (RENA-VIT) tablet 1 tablet  1 tablet Oral QHS Rise Patience, MD       nitroGLYCERIN (NITROSTAT) SL tablet 0.4 mg  0.4 mg Sublingual Q5 min PRN Rise Patience, MD   0.4 mg at 05/30/21 2300   pregabalin (LYRICA) capsule 25 mg  25 mg Oral TID WC Rise Patience, MD   25 mg at 05/31/21 0160   traMADol (ULTRAM) tablet 50 mg  50 mg Oral Q6H PRN Rise Patience, MD       Current Outpatient Medications  Medication Sig Dispense Refill   acetaminophen (TYLENOL) 500 MG tablet Take 500 mg by mouth every 6 (six) hours as needed for mild pain, moderate pain or headache.     albuterol (VENTOLIN HFA) 108 (90 Base) MCG/ACT inhaler Inhale 2 puffs into the lungs 2 (two) times daily as needed for shortness of breath or wheezing.     AURYXIA 1 GM 210 MG(Fe) tablet Take 420 mg by mouth 3 (three) times daily with meals.     BREO ELLIPTA 100-25 MCG/INH AEPB Inhale 1 puff into the lungs daily.     cinacalcet (SENSIPAR) 60 MG tablet Take 60 mg by mouth every evening.     DIPHENHYDRAMINE HCL PO Take 25 mg by mouth as needed (allergies).     multivitamin (RENA-VIT) TABS tablet Take 1 tablet by mouth daily.     amLODipine (NORVASC) 10 MG  tablet Take 10 mg by mouth daily.  (Patient not taking: Reported on 05/31/2021)     doxercalciferol (HECTOROL) 0.5 MCG capsule Take 0.5 mcg by mouth 3 (three) times a week.     furosemide (LASIX) 80 MG tablet Take 80 mg by mouth See admin instructions. Take 80 mg by mouth daily as needed/as directed on non-dialysis days of Sun/Tues/Thurs/Sat (Patient not taking: Reported on 05/31/2021)     hydrALAZINE (APRESOLINE) 25 MG tablet Take 50 mg by mouth in the morning and at bedtime. (Patient not taking: Reported on 05/31/2021)     metoprolol tartrate (LOPRESSOR) 100 MG tablet Take 0.5 tablets (50 mg total) by mouth 2 (two) times daily. (Patient not taking: Reported on 05/31/2021)     pregabalin (LYRICA) 25 MG capsule Take 25 mg by mouth 3 (three) times daily with meals. (Patient not taking: Reported on 05/31/2021)     traMADol (ULTRAM) 50 MG tablet Take 1 tablet (50 mg total) by mouth every 6 (six) hours as needed. (Patient not taking: Reported on 05/31/2021) 6 tablet 0   Labs: Basic Metabolic Panel: Recent Labs  Lab 05/30/21 1700 05/31/21 0022 05/31/21 0352  NA 141 141  --   K 3.8 3.8  --   CL 99 99  --   CO2 26 26  --   GLUCOSE 87 90  --   BUN 26* 28*  --   CREATININE 5.65* 6.08* 6.36*  CALCIUM 10.4* 10.5*  --    Liver Function Tests: Recent Labs  Lab 05/30/21 1700  AST 22  ALT 11  ALKPHOS 102  BILITOT 1.3*  PROT 6.6  ALBUMIN 3.3*   CBC: Recent Labs  Lab 05/30/21 1700 05/31/21 0022 05/31/21 0352  WBC 3.4* 3.6* 4.4  NEUTROABS 2.3  --   --   HGB 10.1* 10.7* 9.8*  HCT 32.2* 33.6* 30.8*  MCV  104.5* 103.4* 103.7*  PLT 143* 131* 124*   Studies/Results: DG Chest 2 View  Result Date: 05/30/2021 CLINICAL DATA:  Chest pain. EXAM: CHEST - 2 VIEW COMPARISON:  CT angiogram chest 05/18/2021. FINDINGS: Right-sided central venous catheter tip projects over the distal SVC. The heart is enlarged, unchanged. There are hazy opacities in both lungs. There is a stable small right pleural effusion.  There some strandy opacities in the left lung base. There is no pneumothorax. Cervical spinal fusion plate is again noted. No acute fractures are seen. IMPRESSION: 1. Cardiomegaly. 2. Hazy bilateral opacities may represent mild edema. 3. Stable small right pleural effusion. 4. Left basilar atelectasis. Electronically Signed   By: Ronney Asters M.D.   On: 05/30/2021 17:51   CT Head Wo Contrast  Result Date: 05/30/2021 CLINICAL DATA:  Headache.  Hypertension. EXAM: CT HEAD WITHOUT CONTRAST TECHNIQUE: Contiguous axial images were obtained from the base of the skull through the vertex without intravenous contrast. COMPARISON:  02/18/2021 FINDINGS: Brain: No evidence of acute infarction, hemorrhage, hydrocephalus, extra-axial collection or mass lesion/mass effect. Remote right cerebellar hemisphere infarct. There is mild diffuse low-attenuation within the subcortical and periventricular white matter compatible with chronic microvascular disease. Vascular: No hyperdense vessel or unexpected calcification. Skull: Normal. Negative for fracture or focal lesion. Sinuses/Orbits: Mild mucosal thickening within the maxillary sinuses. Other: None IMPRESSION: 1. No acute intracranial abnormalities. 2. Chronic small vessel ischemic disease and brain atrophy. Electronically Signed   By: Kerby Moors M.D.   On: 05/30/2021 18:47    Dialysis Orders:  MWF at [facility] 4hr, 400/800, EDW 56.5kg, 2K/2Ca, AVF, heparin 2000 - sensipar 30mg  q HD - Mircera 247mcg IV q 2 weeks (9/19) - Venofer 50mg  IV q week  Assessment/Plan:  Chest pain: Improved with NTG. Trop flat, EKG without ST changes.  Dyspnea/pulm edema: Has been meeting EDW as outpatient, will challenge down/lower - UFG 3- 3.5L today.  ESRD: Continue HD per usual MWF schedule - for HD today.  Hypertension/volume: Sky high on admit after being out of home meds, now improved with resuming home meds.  Anemia: Hgb 9.8 - due for ESA next on 10/3.  Metabolic bone disease: Ca  high - no VDRA, continue sensipar on MWFs.  Thoracic aortic aneurysm  Chronic back pain s/p recent laminectomy  Veneta Penton, PA-C 05/31/2021, 10:15 AM  [facility]

## 2021-05-31 NOTE — ED Notes (Signed)
RN helped pt with lunch tray

## 2021-06-01 ENCOUNTER — Other Ambulatory Visit: Payer: Self-pay

## 2021-06-01 DIAGNOSIS — J810 Acute pulmonary edema: Secondary | ICD-10-CM

## 2021-06-01 DIAGNOSIS — I10 Essential (primary) hypertension: Secondary | ICD-10-CM | POA: Diagnosis not present

## 2021-06-01 DIAGNOSIS — J9621 Acute and chronic respiratory failure with hypoxia: Secondary | ICD-10-CM | POA: Diagnosis not present

## 2021-06-01 DIAGNOSIS — I160 Hypertensive urgency: Secondary | ICD-10-CM

## 2021-06-01 DIAGNOSIS — R0683 Snoring: Secondary | ICD-10-CM | POA: Diagnosis not present

## 2021-06-01 DIAGNOSIS — N186 End stage renal disease: Secondary | ICD-10-CM | POA: Diagnosis not present

## 2021-06-01 DIAGNOSIS — E8770 Fluid overload, unspecified: Secondary | ICD-10-CM | POA: Diagnosis not present

## 2021-06-01 DIAGNOSIS — J449 Chronic obstructive pulmonary disease, unspecified: Secondary | ICD-10-CM

## 2021-06-01 LAB — BASIC METABOLIC PANEL
Anion gap: 12 (ref 5–15)
BUN: 23 mg/dL (ref 8–23)
CO2: 27 mmol/L (ref 22–32)
Calcium: 10.3 mg/dL (ref 8.9–10.3)
Chloride: 99 mmol/L (ref 98–111)
Creatinine, Ser: 4.89 mg/dL — ABNORMAL HIGH (ref 0.61–1.24)
GFR, Estimated: 13 mL/min — ABNORMAL LOW (ref >60)
Glucose, Bld: 92 mg/dL (ref 70–99)
Potassium: 4.2 mmol/L (ref 3.5–5.1)
Sodium: 138 mmol/L (ref 135–145)

## 2021-06-01 MED ORDER — ALBUTEROL SULFATE HFA 108 (90 BASE) MCG/ACT IN AERS: 2.0000 | INHALATION_SPRAY | Freq: Two times a day (BID) | RESPIRATORY_TRACT | 1 refills | Status: AC | PRN

## 2021-06-01 MED ORDER — CINACALCET HCL 60 MG PO TABS: 60.0000 mg | ORAL_TABLET | Freq: Every evening | ORAL | 1 refills | Status: AC

## 2021-06-01 MED ORDER — FUROSEMIDE 80 MG PO TABS: 80.0000 mg | ORAL_TABLET | Freq: Every day | ORAL | 1 refills | Status: DC | PRN

## 2021-06-01 MED ORDER — BREO ELLIPTA 100-25 MCG/INH IN AEPB: 1.0000 | INHALATION_SPRAY | Freq: Every day | RESPIRATORY_TRACT | 1 refills | Status: AC

## 2021-06-01 MED ORDER — METOPROLOL TARTRATE 50 MG PO TABS
50.0000 mg | ORAL_TABLET | Freq: Two times a day (BID) | ORAL | 1 refills | Status: DC
Start: 2021-06-01 — End: 2021-09-13

## 2021-06-01 MED ORDER — HYDRALAZINE HCL 25 MG PO TABS: 50.0000 mg | ORAL_TABLET | Freq: Three times a day (TID) | ORAL | 1 refills | Status: DC

## 2021-06-01 MED ORDER — AMLODIPINE BESYLATE 10 MG PO TABS: 10.0000 mg | ORAL_TABLET | Freq: Every day | ORAL | 1 refills | Status: DC

## 2021-06-01 NOTE — Progress Notes (Signed)
DISCHARGE NOTE HOME Mihail Prettyman to be discharged Home per MD order. Discussed prescriptions and follow up appointments with the patient. Prescriptions given to patient; medication list explained in detail. Patient verbalized understanding.  Skin clean, dry and intact without evidence of skin break down, no evidence of skin tears noted. IV catheter discontinued intact. Site without signs and symptoms of complications. Dressing and pressure applied. Pt denies pain at the site currently. No complaints noted.  Patient free of lines, drains, and wounds.   An After Visit Summary (AVS) was printed and given to the patient. Patient escorted via wheelchair, and discharged home via private auto.  Vira Agar, RN

## 2021-06-01 NOTE — TOC Transition Note (Signed)
Transition of Care [facility]) - CM/SW Discharge Note   Patient Details  Name: Thomas Robinson MRN: [id number] Date of Birth: 06-10-1961  [facility]) CM/SW Contact:  Sharin Mons, RN Phone Number: 06/01/2021, 3:50 PM   Clinical Narrative:    Patient will DC to: home Anticipated DC date: 06/01/2021 Family notified: yes, friend Transport by: car    Admitted with HTN urgency Per MD patient ready for DC today. RN, patient, and patient's friend notified of DC. Order noted for DME HOME OXYGEN. Referral made with [employer]. Portable oxygen concentrator and tank will be delivered to pt bedside prior to d/c.  Pt without Rx med concerns or affordability issues.  Post hospital f/u noted on AVS.  Friend to provide transportation to home .  RNCM will sign off for now as intervention is no longer needed. Please consult [place] again if new needs arise.    Final next level of care: Home/Self Care Barriers to Discharge: No Barriers Identified   Patient Goals and CMS Choice     Choice offered to / list presented to : Patient  Discharge Placement                       Discharge Plan and Services                DME Arranged: Oxygen DME Agency: [employer] Date DME Agency Contacted: 06/01/21 Time DME Agency Contacted: 29 Representative spoke with at DME Agency: [email or web address]            Social Determinants of Health ([place]) Interventions     Readmission Risk Interventions Readmission Risk Prevention Plan 01/15/2021 09/11/2020  Transportation Screening Complete Complete  Medication Review [employer]) Complete -  PCP or Specialist appointment within 3-5 days of discharge - Not Complete  PCP/Specialist Appt Not Complete comments - first available appt is 09/26/20  [place] or Home Care Consult Complete (No Data)  SW Recovery Care/Counseling Consult Complete Complete  Palliative Care Screening Not Applicable Not [place] Not Applicable  Not Applicable  Some recent data might be hidden

## 2021-06-01 NOTE — Discharge Summary (Addendum)
Physician Discharge Summary  Thomas Robinson OAC:[id number] DOB: 1961-07-28 DOA: 05/30/2021  PCP: [name], PA  Admit date: 05/30/2021 Discharge date: 06/01/2021  Time spent: 60 minutes  Recommendations for Outpatient Follow-up:  Will need outpatient CT surgery referral for ascending thoracic aortic aneurysm Dr. Cyndia Bent from [place] surgery will see patient as outpatient Patient will be discharged on home oxygen 3 L/min Follow-up pulmonology in 2 weeks as outpatient   Discharge Diagnoses:  Principal Problem:   Hypertensive urgency Active Problems:   Chest pain   End stage renal disease [facility])   Discharge Condition: Stable  Diet recommendation: Heart healthy diet  Filed Weights   05/31/21 2130 05/31/21 2300  Weight: 58.8 kg 59.4 kg    History of present illness:  60 year old male with history of ESRD on hemodialysis Monday Wednesday Friday, hypertension, ascending thoracic aortic aneurysm, anemia, recent lumbar surgery was recently in the hospital and signed out AMA came to hospital with chest pressure over past 3 days.  Patient states that he has episodic chest pressure with no associated productive cough, fever or chills.  He had gone to dialysis yesterday but was unable to complete the full session because of low back pain. In the ED he was found to have systolic blood pressure of 230 for which he received IV hydralazine 20 mg and sublingual nitroglycerin.  Chest pressure improved after that.  Chest x-ray showed fluid overload.  SARS-CoV-2 RT-PCR was negative.  Patient was mated with chest pressure with hypertensive urgency.  Hospital Course:   Hypertensive urgency -Resolved -Patient states that he ran out of medications for almost a month -Home medication including amlodipine, hydralazine and metoprolol has been restarted -Chest pressure has resolved   Chest pain -Likely from hypertensive urgency -Patient received nitroglycerin yesterday -Troponin was 79, 81.    -Likely demand ischemia from above  Acute on chronic hypoxemic respiratory failure -Pulmonology was consulted, likely from underlying COPD, asthma and possible sleep apnea -Recommended to continue with Breo and as needed albuterol -Follow pulmonology as outpatient -We will arrange for home O2 before discharge   ESRD on hemodialysis -Patient hemodialysis MWF -Nephrology was consulted for hemodialysis today   History of ascending thoracic arctic aneurysm -No chest pain -He will need outpatient CT surgery referral as outpatient -Called and discussed with CT surgeon Dr. Cyndia Bent, he will see patient as outpatient.   Grade 1 diastolic dysfunction -Followed by cardiology as outpatient     Recent back surgery -Stable   Will give refill for all medications patient is taking at home.  Procedures:   Consultations: Nephrology Pulmonology  Discharge Exam: Vitals:   06/01/21 0457 06/01/21 1144  BP: (!) 171/91 114/86  Pulse: 79 79  Resp: 16 16  Temp: 98.2 F (36.8 C) 98.8 F (37.1 C)  SpO2: 100% 96%    General: Appears in no acute distress Cardiovascular: S1-S2, regular, no murmur auscultated Respiratory: Clear to auscultation bilaterally  Discharge Instructions   Discharge Instructions     Diet - low sodium heart healthy   Complete by: As directed    Increase activity slowly   Complete by: As directed       Allergies as of 06/01/2021       Reactions   Vancomycin Shortness Of Breath, Itching        Medication List     STOP taking these medications    doxercalciferol 0.5 MCG capsule Commonly known as: HECTOROL   pregabalin 25 MG capsule Commonly known as: LYRICA   traMADol 50  MG tablet Commonly known as: Ultram       TAKE these medications    acetaminophen 500 MG tablet Commonly known as: TYLENOL Take 500 mg by mouth every 6 (six) hours as needed for mild pain, moderate pain or headache.   albuterol 108 (90 Base) MCG/ACT inhaler Commonly  known as: VENTOLIN HFA Inhale 2 puffs into the lungs 2 (two) times daily as needed for shortness of breath or wheezing.   amLODipine 10 MG tablet Commonly known as: NORVASC Take 1 tablet (10 mg total) by mouth daily.   Auryxia 1 GM 210 MG(Fe) tablet Generic drug: ferric citrate Take 420 mg by mouth 3 (three) times daily with meals.   Breo Ellipta 100-25 MCG/INH Aepb Generic drug: fluticasone furoate-vilanterol Inhale 1 puff into the lungs daily.   cinacalcet 60 MG tablet Commonly known as: SENSIPAR Take 1 tablet (60 mg total) by mouth every evening.   DIPHENHYDRAMINE HCL PO Take 25 mg by mouth as needed (allergies).   furosemide 80 MG tablet Commonly known as: LASIX Take 1 tablet (80 mg total) by mouth daily as needed for fluid (Use only on non-HD days (Tues/Thur/Sat/Sun)). What changed:  when to take this reasons to take this additional instructions   hydrALAZINE 25 MG tablet Commonly known as: APRESOLINE Take 2 tablets (50 mg total) by mouth 3 (three) times daily. What changed: when to take this   metoprolol tartrate 50 MG tablet Commonly known as: LOPRESSOR Take 1 tablet (50 mg total) by mouth 2 (two) times daily. What changed: medication strength   multivitamin Tabs tablet Take 1 tablet by mouth daily.               Durable Medical Equipment  (From admission, onward)           Start     Ordered   06/01/21 1537  For home use only DME oxygen  Once       Question Answer Comment  Length of Need Lifetime   Mode or (Route) Nasal cannula   Liters per Minute 3   Frequency Continuous (stationary and portable oxygen unit needed)   Oxygen conserving device Yes   Oxygen delivery system Gas      06/01/21 1537           Allergies  Allergen Reactions   Vancomycin Shortness Of Breath and Itching    Follow-up Information     Chesley Mires, MD. Schedule an appointment as soon as possible for a visit in 2 week(s).   Specialty: Pulmonary  Disease Contact information: [address] [place] [address] [phone]         [name], PA Follow up.   Specialty: Physician Assistant Why: Will need referral for CT surgeon as outpatient for ascending thoracic aortic aneurysm Contact information: [place] [address] [phone]         [name], DO .   Specialty: Cardiology Contact information: [address] [place] [address] [phone]                  The results of significant diagnostics from this hospitalization (including imaging, microbiology, ancillary and laboratory) are listed below for reference.    Significant Diagnostic Studies: DG Chest 2 View  Result Date: 05/30/2021 CLINICAL DATA:  Chest pain. EXAM: CHEST - 2 VIEW COMPARISON:  CT angiogram chest 05/18/2021. FINDINGS: Right-sided central venous catheter tip projects over the distal SVC. The heart  is enlarged, unchanged. There are hazy opacities in both lungs. There is a stable small right pleural effusion. There some strandy opacities in the left lung base. There is no pneumothorax. Cervical spinal fusion plate is again noted. No acute fractures are seen. IMPRESSION: 1. Cardiomegaly. 2. Hazy bilateral opacities may represent mild edema. 3. Stable small right pleural effusion. 4. Left basilar atelectasis. Electronically Signed   By: Ronney Asters M.D.   On: 05/30/2021 17:51   DG Chest 2 View  Result Date: 05/18/2021 CLINICAL DATA:  Chest pain, weakness, hypoxia EXAM: CHEST - 2 VIEW COMPARISON:  Chest x-ray 03/29/2021, CT chest 05/23/2020 FINDINGS: Right chest wall dialysis catheter with tip overlying the expected region of the superior cavoatrial junction. Persistent cardiomegaly. The heart and mediastinal contours are unchanged. Aortic calcification. Slight prominence of the hilar vasculature. No focal consolidation. Slightly increased interstitial markings,  Kerley B lines. Blunting of bilateral costophrenic angles with possible trace pleural effusions. No pneumothorax. No acute osseous abnormality. IMPRESSION: Mild pulmonary edema.  Trace pleural effusions. Electronically Signed   By: Iven Finn M.D.   On: 05/18/2021 16:03   CT Head Wo Contrast  Result Date: 05/30/2021 CLINICAL DATA:  Headache.  Hypertension. EXAM: CT HEAD WITHOUT CONTRAST TECHNIQUE: Contiguous axial images were obtained from the base of the skull through the vertex without intravenous contrast. COMPARISON:  02/18/2021 FINDINGS: Brain: No evidence of acute infarction, hemorrhage, hydrocephalus, extra-axial collection or mass lesion/mass effect. Remote right cerebellar hemisphere infarct. There is mild diffuse low-attenuation within the subcortical and periventricular white matter compatible with chronic microvascular disease. Vascular: No hyperdense vessel or unexpected calcification. Skull: Normal. Negative for fracture or focal lesion. Sinuses/Orbits: Mild mucosal thickening within the maxillary sinuses. Other: None IMPRESSION: 1. No acute intracranial abnormalities. 2. Chronic small vessel ischemic disease and brain atrophy. Electronically Signed   By: Kerby Moors M.D.   On: 05/30/2021 18:47   CT Angio Chest PE W and/or Wo Contrast  Result Date: 05/18/2021 CLINICAL DATA:  PE suspected EXAM: CT ANGIOGRAPHY CHEST WITH CONTRAST TECHNIQUE: Multidetector CT imaging of the chest was performed using the standard protocol during bolus administration of intravenous contrast. Multiplanar CT image reconstructions and MIPs were obtained to evaluate the vascular anatomy. CONTRAST:  59mL OMNIPAQUE IOHEXOL 350 MG/ML SOLN COMPARISON:  None. FINDINGS: Cardiovascular: Satisfactory opacification of the pulmonary arteries to the segmental level. No evidence of pulmonary embolism. Cardiomegaly. Three-vessel coronary artery calcifications. No pericardial effusion. Right neck large bore multi lumen  vascular catheter. Enlargement of the tubular ascending thoracic aorta, measuring 4.6 x 4.5 cm. Mediastinum/Nodes: No enlarged mediastinal, hilar, or axillary lymph nodes. Thyroid gland, trachea, and esophagus demonstrate no significant findings. Lungs/Pleura: Diffuse bilateral bronchial wall thickening. Trace bilateral pleural effusions associated atelectasis or consolidation. Upper Abdomen: No acute abnormality. Musculoskeletal: No chest wall abnormality. No acute or significant osseous findings. Renal osteodystrophy. Review of the MIP images confirms the above findings. IMPRESSION: 1. Negative examination for pulmonary embolism. 2. Diffuse bilateral bronchial wall thickening, consistent with nonspecific infectious or inflammatory bronchitis. 3. Trace bilateral pleural effusions and associated atelectasis or consolidation. 4. Cardiomegaly and coronary artery disease. 5. Enlargement of the tubular ascending thoracic aorta, measuring 4.6 x 4.5 cm. Ascending thoracic aortic aneurysm. Recommend semi-annual imaging followup by CTA or MRA and referral to cardiothoracic surgery if not already obtained. This recommendation follows 2010 ACCF/AHA/AATS/ACR/ASA/SCA/SCAI/SIR/STS/SVM Guidelines for the Diagnosis and Management of Patients With Thoracic Aortic Disease. Circulation. 2010; 121: D983-J825. Aortic aneurysm NOS (ICD10-I71.9) Electronically Signed   By: Eddie Candle  M.D.   On: 05/18/2021 19:40   VAS [place] LOWER EXTREMITY VENOUS (DVT) (ONLY [facility] & [facility])  Result Date: 05/19/2021  Lower Venous DVT Study Patient Name:  HERMES WAFER  Date of Exam:   05/18/2021 Medical Rec #: [id number]         Accession #:    [id number] Date of Birth: [birth year]         Patient Gender: M Patient Age:   50 years Exam Location:  [facility] Procedure:      VAS [place] LOWER EXTREMITY VENOUS (DVT) Referring Phys: Blanchie Dessert --------------------------------------------------------------------------------  Indications: Swelling.   Comparison Study: No prior study Performing Technologist: Sharion Dove RVS  Examination Guidelines: A complete evaluation includes B-mode imaging, spectral Doppler, color Doppler, and power Doppler as needed of all accessible portions of each vessel. Bilateral testing is considered an integral part of a complete examination. Limited examinations for reoccurring indications may be performed as noted. The reflux portion of the exam is performed with the patient in reverse Trendelenburg.  +---------+---------------+---------+-----------+----------+------------------+ RIGHT    CompressibilityPhasicitySpontaneityPropertiesThrombus Aging     +---------+---------------+---------+-----------+----------+------------------+ CFV      Full                                         pulsatile waveform +---------+---------------+---------+-----------+----------+------------------+ SFJ      Full                                                            +---------+---------------+---------+-----------+----------+------------------+ FV Prox  Full                                                            +---------+---------------+---------+-----------+----------+------------------+ FV Mid   Full                                                            +---------+---------------+---------+-----------+----------+------------------+ FV DistalFull                                                            +---------+---------------+---------+-----------+----------+------------------+ PFV      Full                                                            +---------+---------------+---------+-----------+----------+------------------+ POP      Full  pulsatile waveform +---------+---------------+---------+-----------+----------+------------------+ PTV      Full                                                             +---------+---------------+---------+-----------+----------+------------------+ PERO     Full                                                            +---------+---------------+---------+-----------+----------+------------------+   +----+---------------+---------+-----------+----------+------------------+ LEFTCompressibilityPhasicitySpontaneityPropertiesThrombus Aging     +----+---------------+---------+-----------+----------+------------------+ CFV Full                                         pulsatile waveform +----+---------------+---------+-----------+----------+------------------+    Summary: RIGHT: - There is no evidence of deep vein thrombosis in the lower extremity.  pulsatile waveforms suggestive of fluid overload  LEFT: - No evidence of common femoral vein obstruction. Pulsatile waveforms suggestive of fluid overload.  *See table(s) above for measurements and observations. Electronically signed by Orlie Pollen on 05/19/2021 at 2:53:28 PM.    Final     Microbiology: Recent Results (from the past 240 hour(s))  Resp Panel by RT-PCR (Flu A&B, Covid) Nasopharyngeal Swab     Status: None   Collection Time: 05/30/21  8:21 PM   Specimen: Nasopharyngeal Swab; Nasopharyngeal(NP) swabs in vial transport medium  Result Value Ref Range Status   SARS Coronavirus 2 by RT PCR NEGATIVE NEGATIVE Final    Comment: (NOTE) SARS-CoV-2 target nucleic acids are NOT DETECTED.  The SARS-CoV-2 RNA is generally detectable in upper respiratory specimens during the acute phase of infection. The lowest concentration of SARS-CoV-2 viral copies this assay can detect is 138 copies/mL. A negative result does not preclude SARS-Cov-2 infection and should not be used as the sole basis for treatment or other patient management decisions. A negative result may occur with  improper specimen collection/handling, submission of specimen other than nasopharyngeal swab, presence of viral mutation(s) within  the areas targeted by this assay, and inadequate number of viral copies(<138 copies/mL). A negative result must be combined with clinical observations, patient history, and epidemiological information. The expected result is Negative.  Fact Sheet for Patients:  EntrepreneurPulse.com.au  Fact Sheet for Healthcare Providers:  IncredibleEmployment.be  This test is no t yet approved or cleared by the [place] FDA and  has been authorized for detection and/or diagnosis of SARS-CoV-2 by FDA under an Emergency Use Authorization (EUA). This EUA will remain  in effect (meaning this test can be used) for the duration of the COVID-19 declaration under Section 564(b)(1) of the Act, 21 U.S.C.section 360bbb-3(b)(1), unless the authorization is terminated  or revoked sooner.       Influenza A by PCR NEGATIVE NEGATIVE Final   Influenza B by PCR NEGATIVE NEGATIVE Final    Comment: (NOTE) The Xpert Xpress SARS-CoV-2/FLU/RSV plus assay is intended as an aid in the diagnosis of influenza from Nasopharyngeal swab specimens and should not be used as a sole basis for treatment. Nasal washings and aspirates are unacceptable for Xpert Xpress SARS-CoV-2/FLU/RSV testing.  Fact Sheet  for Patients: EntrepreneurPulse.com.au  Fact Sheet for Healthcare Providers: IncredibleEmployment.be  This test is not yet approved or cleared by the [place] FDA and has been authorized for detection and/or diagnosis of SARS-CoV-2 by FDA under an Emergency Use Authorization (EUA). This EUA will remain in effect (meaning this test can be used) for the duration of the COVID-19 declaration under Section 564(b)(1) of the Act, 21 U.S.C. section 360bbb-3(b)(1), unless the authorization is terminated or revoked.  Performed at [facility], [place] [address] [place], [place] [address]      Labs: Basic Metabolic Panel: Recent Labs  Lab  05/30/21 1700 05/31/21 0022 05/31/21 0352 06/01/21 0839  NA 141 141  --  138  K 3.8 3.8  --  4.2  CL 99 99  --  99  CO2 26 26  --  27  GLUCOSE 87 90  --  92  BUN 26* 28*  --  23  CREATININE 5.65* 6.08* 6.36* 4.89*  CALCIUM 10.4* 10.5*  --  10.3   Liver Function Tests: Recent Labs  Lab 05/30/21 1700  AST 22  ALT 11  ALKPHOS 102  BILITOT 1.3*  PROT 6.6  ALBUMIN 3.3*   No results for input(s): LIPASE, AMYLASE in the last 168 hours. No results for input(s): AMMONIA in the last 168 hours. CBC: Recent Labs  Lab 05/30/21 1700 05/31/21 0022 05/31/21 0352  WBC 3.4* 3.6* 4.4  NEUTROABS 2.3  --   --   HGB 10.1* 10.7* 9.8*  HCT 32.2* 33.6* 30.8*  MCV 104.5* 103.4* 103.7*  PLT 143* 131* 124*   Cardiac Enzymes: No results for input(s): CKTOTAL, CKMB, CKMBINDEX, TROPONINI in the last 168 hours. BNP: BNP (last 3 results) Recent Labs    09/18/20 0635 02/18/21 0107 03/29/21 0205  BNP 4,033.1* 4,369.1* >4,500.0*    ProBNP (last 3 results) No results for input(s): PROBNP in the last 8760 hours.  CBG: No results for input(s): GLUCAP in the last 168 hours.     Signed:  Oswald Hillock MD.  [facility] 06/01/2021, 4:05 PM
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
## 2021-06-01 NOTE — Plan of Care (Signed)

## 2021-06-01 NOTE — Progress Notes (Signed)
SATURATION QUALIFICATIONS: (This note is used to comply with regulatory documentation for home oxygen)  Patient Saturations on Room Air at Rest = 82%  Patient Saturations on Room Air while Ambulating = 80%  Patient Saturations on 3 Liters of oxygen while Ambulating = 93%  Please briefly explain why patient needs home oxygen:  Patient needs home oxygen for rest and ambulation.   Thomas Robinson

## 2021-06-02 ENCOUNTER — Telehealth: Payer: Self-pay | Admitting: Nephrology

## 2021-06-02 LAB — HEPATITIS B SURFACE ANTIBODY, QUANTITATIVE: Hep B S AB Quant (Post): <3.1 m[IU]/mL — ABNORMAL LOW (ref >9.9)

## 2021-06-02 NOTE — Telephone Encounter (Signed)
Transition of care contact from inpatient facility  Date of Discharge: 06/01/21 Date of Contact:Attempted  06/02/21 Method of contact: Phone  Attempted to contact patient to discuss transition of care from inpatient admission. Patient did not answer the phone. Message was left on the patient's voicemail with call back number [phone].

## 2021-06-02 NOTE — Telephone Encounter (Signed)
Transition of care contact from inpatient facility  Date of discharge: 06/01/21 Date of contact: 06/02/21 Method: Phone Spoke to: Patient  Patient contacted to discuss transition of care from recent inpatient hospitalization. Patient was admitted to [facility] from 05/30/21 to 06/01/21... with discharge diagnosis of  HTN urgency/ Pulmonary edema/ Chest pain  .[name]  Medication changes were reviewed.and pt aware needs FU DR Cyndia Bent for  OP eval Ascending thoracic Aortic Aneurysm   Patient will follow up with his/her outpatient HD unit on: 06/03/21

## 2021-06-29 ENCOUNTER — Encounter (HOSPITAL_COMMUNITY): Payer: Self-pay | Admitting: Emergency Medicine

## 2021-06-29 ENCOUNTER — Emergency Department (HOSPITAL_COMMUNITY): Payer: Medicare Other

## 2021-06-29 ENCOUNTER — Inpatient Hospital Stay (HOSPITAL_COMMUNITY)
Admission: EM | Admit: 2021-06-29 | Discharge: 2021-07-05 | DRG: 871 | Discharge status: Against Medical Advice | Payer: Medicare Other | Attending: Internal Medicine | Admitting: Internal Medicine

## 2021-06-29 ENCOUNTER — Other Ambulatory Visit: Payer: Self-pay

## 2021-06-29 DIAGNOSIS — J181 Lobar pneumonia, unspecified organism: Secondary | ICD-10-CM | POA: Diagnosis not present

## 2021-06-29 DIAGNOSIS — L8990 Pressure ulcer of unspecified site, unspecified stage: Secondary | ICD-10-CM | POA: Insufficient documentation

## 2021-06-29 DIAGNOSIS — J90 Pleural effusion, not elsewhere classified: Secondary | ICD-10-CM

## 2021-06-29 DIAGNOSIS — Z20822 Contact with and (suspected) exposure to covid-19: Secondary | ICD-10-CM | POA: Diagnosis present

## 2021-06-29 DIAGNOSIS — R9389 Abnormal findings on diagnostic imaging of other specified body structures: Secondary | ICD-10-CM | POA: Diagnosis present

## 2021-06-29 DIAGNOSIS — G8929 Other chronic pain: Secondary | ICD-10-CM | POA: Diagnosis present

## 2021-06-29 DIAGNOSIS — J918 Pleural effusion in other conditions classified elsewhere: Secondary | ICD-10-CM | POA: Diagnosis present

## 2021-06-29 DIAGNOSIS — Z87892 Personal history of anaphylaxis: Secondary | ICD-10-CM

## 2021-06-29 DIAGNOSIS — I248 Other forms of acute ischemic heart disease: Secondary | ICD-10-CM | POA: Diagnosis present

## 2021-06-29 DIAGNOSIS — Z9889 Other specified postprocedural states: Secondary | ICD-10-CM

## 2021-06-29 DIAGNOSIS — I7121 Aneurysm of the ascending aorta, without rupture: Secondary | ICD-10-CM | POA: Diagnosis present

## 2021-06-29 DIAGNOSIS — A419 Sepsis, unspecified organism: Secondary | ICD-10-CM | POA: Diagnosis not present

## 2021-06-29 DIAGNOSIS — R569 Unspecified convulsions: Secondary | ICD-10-CM | POA: Diagnosis not present

## 2021-06-29 DIAGNOSIS — E8770 Fluid overload, unspecified: Secondary | ICD-10-CM | POA: Diagnosis present

## 2021-06-29 DIAGNOSIS — E860 Dehydration: Secondary | ICD-10-CM | POA: Diagnosis present

## 2021-06-29 DIAGNOSIS — N186 End stage renal disease: Secondary | ICD-10-CM | POA: Diagnosis present

## 2021-06-29 DIAGNOSIS — R6510 Systemic inflammatory response syndrome (SIRS) of non-infectious origin without acute organ dysfunction: Secondary | ICD-10-CM | POA: Diagnosis present

## 2021-06-29 DIAGNOSIS — Z809 Family history of malignant neoplasm, unspecified: Secondary | ICD-10-CM

## 2021-06-29 DIAGNOSIS — R64 Cachexia: Secondary | ICD-10-CM | POA: Diagnosis present

## 2021-06-29 DIAGNOSIS — K760 Fatty (change of) liver, not elsewhere classified: Secondary | ICD-10-CM | POA: Diagnosis present

## 2021-06-29 DIAGNOSIS — Z09 Encounter for follow-up examination after completed treatment for conditions other than malignant neoplasm: Secondary | ICD-10-CM

## 2021-06-29 DIAGNOSIS — Z8673 Personal history of transient ischemic attack (TIA), and cerebral infarction without residual deficits: Secondary | ICD-10-CM

## 2021-06-29 DIAGNOSIS — M898X9 Other specified disorders of bone, unspecified site: Secondary | ICD-10-CM | POA: Diagnosis present

## 2021-06-29 DIAGNOSIS — L89152 Pressure ulcer of sacral region, stage 2: Secondary | ICD-10-CM | POA: Diagnosis present

## 2021-06-29 DIAGNOSIS — R7401 Elevation of levels of liver transaminase levels: Secondary | ICD-10-CM

## 2021-06-29 DIAGNOSIS — D689 Coagulation defect, unspecified: Secondary | ICD-10-CM | POA: Diagnosis present

## 2021-06-29 DIAGNOSIS — Z823 Family history of stroke: Secondary | ICD-10-CM

## 2021-06-29 DIAGNOSIS — D631 Anemia in chronic kidney disease: Secondary | ICD-10-CM | POA: Diagnosis present

## 2021-06-29 DIAGNOSIS — E162 Hypoglycemia, unspecified: Secondary | ICD-10-CM | POA: Diagnosis not present

## 2021-06-29 DIAGNOSIS — J9620 Acute and chronic respiratory failure, unspecified whether with hypoxia or hypercapnia: Secondary | ICD-10-CM

## 2021-06-29 DIAGNOSIS — F419 Anxiety disorder, unspecified: Secondary | ICD-10-CM | POA: Diagnosis present

## 2021-06-29 DIAGNOSIS — E873 Alkalosis: Secondary | ICD-10-CM | POA: Diagnosis present

## 2021-06-29 DIAGNOSIS — G2581 Restless legs syndrome: Secondary | ICD-10-CM | POA: Diagnosis present

## 2021-06-29 DIAGNOSIS — R7989 Other specified abnormal findings of blood chemistry: Secondary | ICD-10-CM | POA: Diagnosis present

## 2021-06-29 DIAGNOSIS — R918 Other nonspecific abnormal finding of lung field: Secondary | ICD-10-CM

## 2021-06-29 DIAGNOSIS — E871 Hypo-osmolality and hyponatremia: Secondary | ICD-10-CM | POA: Diagnosis present

## 2021-06-29 DIAGNOSIS — J811 Chronic pulmonary edema: Secondary | ICD-10-CM

## 2021-06-29 DIAGNOSIS — E785 Hyperlipidemia, unspecified: Secondary | ICD-10-CM | POA: Diagnosis present

## 2021-06-29 DIAGNOSIS — D539 Nutritional anemia, unspecified: Secondary | ICD-10-CM | POA: Diagnosis present

## 2021-06-29 DIAGNOSIS — Z79899 Other long term (current) drug therapy: Secondary | ICD-10-CM

## 2021-06-29 DIAGNOSIS — J9621 Acute and chronic respiratory failure with hypoxia: Secondary | ICD-10-CM | POA: Diagnosis present

## 2021-06-29 DIAGNOSIS — Z881 Allergy status to other antibiotic agents status: Secondary | ICD-10-CM

## 2021-06-29 DIAGNOSIS — K7689 Other specified diseases of liver: Secondary | ICD-10-CM | POA: Diagnosis present

## 2021-06-29 DIAGNOSIS — R1110 Vomiting, unspecified: Secondary | ICD-10-CM

## 2021-06-29 DIAGNOSIS — Z681 Body mass index (BMI) 19 or less, adult: Secondary | ICD-10-CM

## 2021-06-29 DIAGNOSIS — Z992 Dependence on renal dialysis: Secondary | ICD-10-CM

## 2021-06-29 DIAGNOSIS — Z981 Arthrodesis status: Secondary | ICD-10-CM

## 2021-06-29 DIAGNOSIS — E875 Hyperkalemia: Secondary | ICD-10-CM | POA: Diagnosis present

## 2021-06-29 DIAGNOSIS — K219 Gastro-esophageal reflux disease without esophagitis: Secondary | ICD-10-CM | POA: Diagnosis present

## 2021-06-29 DIAGNOSIS — Z9115 Patient's noncompliance with renal dialysis: Secondary | ICD-10-CM

## 2021-06-29 DIAGNOSIS — E2740 Unspecified adrenocortical insufficiency: Secondary | ICD-10-CM | POA: Diagnosis present

## 2021-06-29 DIAGNOSIS — I5032 Chronic diastolic (congestive) heart failure: Secondary | ICD-10-CM | POA: Diagnosis present

## 2021-06-29 DIAGNOSIS — I272 Pulmonary hypertension, unspecified: Secondary | ICD-10-CM | POA: Diagnosis present

## 2021-06-29 DIAGNOSIS — Z7951 Long term (current) use of inhaled steroids: Secondary | ICD-10-CM

## 2021-06-29 DIAGNOSIS — I4892 Unspecified atrial flutter: Secondary | ICD-10-CM

## 2021-06-29 DIAGNOSIS — R6521 Severe sepsis with septic shock: Secondary | ICD-10-CM | POA: Diagnosis present

## 2021-06-29 DIAGNOSIS — I080 Rheumatic disorders of both mitral and aortic valves: Secondary | ICD-10-CM | POA: Diagnosis present

## 2021-06-29 DIAGNOSIS — I132 Hypertensive heart and chronic kidney disease with heart failure and with stage 5 chronic kidney disease, or end stage renal disease: Secondary | ICD-10-CM | POA: Diagnosis present

## 2021-06-29 LAB — CBC WITH DIFFERENTIAL/PLATELET
Abs Immature Granulocytes: 0.01 10*3/uL (ref 0.00–0.07)
Basophils Absolute: 0 10*3/uL (ref 0.0–0.1)
Basophils Relative: 1 %
Eosinophils Absolute: 0.1 10*3/uL (ref 0.0–0.5)
Eosinophils Relative: 1 %
HCT: 30.5 % — ABNORMAL LOW (ref 39.0–52.0)
Hemoglobin: 9.8 g/dL — ABNORMAL LOW (ref 13.0–17.0)
Immature Granulocytes: 0 %
Lymphocytes Relative: 12 %
Lymphs Abs: 0.5 10*3/uL — ABNORMAL LOW (ref 0.7–4.0)
MCH: 33.4 pg (ref 26.0–34.0)
MCHC: 32.1 g/dL (ref 30.0–36.0)
MCV: 104.1 fL — ABNORMAL HIGH (ref 80.0–100.0)
Monocytes Absolute: 0.2 10*3/uL (ref 0.1–1.0)
Monocytes Relative: 5 %
Neutro Abs: 3.6 10*3/uL (ref 1.7–7.7)
Neutrophils Relative %: 81 %
Platelets: 181 10*3/uL (ref 150–400)
RBC: 2.93 MIL/uL — ABNORMAL LOW (ref 4.22–5.81)
RDW: 18.9 % — ABNORMAL HIGH (ref 11.5–15.5)
WBC: 4.4 10*3/uL (ref 4.0–10.5)
nRBC: 0 % (ref 0.0–0.2)

## 2021-06-29 LAB — RESP PANEL BY RT-PCR (FLU A&B, COVID) ARPGX2
Influenza A by PCR: NEGATIVE
Influenza B by PCR: NEGATIVE
SARS Coronavirus 2 by RT PCR: NEGATIVE

## 2021-06-29 MED ORDER — METOPROLOL TARTRATE 5 MG/5ML IV SOLN
5.0000 mg | Freq: Once | INTRAVENOUS | Status: AC
  Administered 2021-06-30: 5 mg via INTRAVENOUS
  Filled 2021-06-29: qty 5

## 2021-06-29 MED ORDER — ALBUTEROL SULFATE HFA 108 (90 BASE) MCG/ACT IN AERS: 2.0000 | INHALATION_SPRAY | RESPIRATORY_TRACT | Status: DC | PRN

## 2021-06-29 NOTE — ED Provider Notes (Addendum)
[facility] DEPT Provider Note   CSN: [id number] Arrival date & time: 06/29/21  2139     History Chief Complaint  Patient presents with   Shortness of Breath    Thomas Robinson is a 60 y.o. male. W/ extensive pmhx including ESRD, chronic respiratory failure on 2L via [place], prior Gi bleed, prior stroke, pulmonary hypertension, aortic aneurysm, CHF with last EF 60-65% w/ grade I diastolic dysfunction and asthma who presents to the ED with complaints of generalized weakness for the past few days. Patient reports he has had generalized weakness sand fatigue, he feels exhausted, this has been progressively worsening, especially noticeable following dialysis yesterday. Has had associated chills, cough productive of phlegm sputum, chest pain that is constant- worse with coughing/movement, dyspnea, and diarrhea. Reports 4-5 episodes of watery stool per day. He has felt nauseated but has not had vomiting. Today he was too fatigued to get to his oxygen tank, felt very short of breath, EMS was called. Patient found to be hypoxic into the 70s on RA- applied NRB with improvement. Patient denies fever, vomiting, syncope, abdominal pain, dysuria, melena, or hematochezia. Last dialyzed yesterday- completed tx,    HPI     Past Medical History:  Diagnosis Date   Anaphylactic shock, unspecified, sequela 06/10/2019   Aortic stenosis    mild-moderate AS 12/2020 echo   Arthritis    Asthma, chronic, unspecified asthma severity, with acute exacerbation 10/23/2017   CAP (community acquired pneumonia) 10/23/2017   Carpal tunnel syndrome of right wrist 10/05/2018   Cataract    right - removed by surgery   Cerebral infarction due to thrombosis of cerebral artery (HCC)    Cervical disc herniation 01/04/2020   Chronic diastolic heart failure ([place]) 04/13/2018   Chronic low back pain 11/24/2019   Constipation    Cough    chronic cough   Dyspnea    Encounter for immunization  07/08/2017   ESRD on hemodialysis ([place]) 02/16/2018   Fall 06/04/2019   GERD (gastroesophageal reflux disease) 06/04/2019   GI bleeding 05/24/2017   Gram-negative sepsis, unspecified ([place]) 09/05/2017   History of fusion of cervical spine 03/26/2020   Hyperlipidemia    Hypertension    Hypokalemia 06/04/2017   Iron deficiency anemia, unspecified 09/09/2017   Left shoulder pain 11/11/2019   Lumbar radiculopathy 11/24/2019   Lung contusion 06/04/2019   LVH (left ventricular hypertrophy) due to hypertensive disease, with heart failure ([place]) 06/18/2020   Macrocytic anemia 05/24/2017   Moderate protein-calorie malnutrition ([place]) 06/06/2017   Myofascial pain syndrome 06/12/2020   Neuritis of right ulnar nerve 09/13/2018   Non-compliance with renal dialysis ([place]) 02/08/2020   Oxygen deficiency 12/30/2019   O2 sats on RA 87% at PAT appt    Pain in joint of right elbow 09/13/2018   Pulmonary hypertension ([place])    Renovascular hypertension 06/22/2020   Restless leg syndrome    Rib fractures 06/2019   Right   S/P cardiac cath 08/27/2020   normal coronary arteries.   Secondary hyperparathyroidism of renal origin ([place]) 06/04/2017   Thoracic ascending aortic aneurysm    4.5 cm 06/04/19 CT   Ulnar neuropathy 10/05/2018   Volume overload 10/23/2017    Patient Active Problem List   Diagnosis Date Noted   Acute hypoxemic respiratory failure ([place]) 05/18/2021   Bronchitis 05/18/2021   Preoperative cardiovascular examination 03/06/2021   CHF (congestive heart failure) ([place]) 02/17/2021   Cellulitis of arm, left 02/01/2021   Cellulitis of left lower limb 02/01/2021  Cellulitis, unspecified 02/01/2021   Left arm swelling 01/25/2021   Acute on chronic respiratory failure ([place]) 01/13/2021   Acute on chronic respiratory failure with hypoxia ([place]) 01/12/2021   Concussion with no loss of consciousness 01/12/2021   Laceration without foreign body of left forearm, initial encounter 01/12/2021    Age-related physical debility 09/20/2020   Allergy, unspecified, initial encounter 09/20/2020   Nausea 09/20/2020   Pain, unspecified 09/20/2020   Pruritus, unspecified 09/20/2020   S/P cardiac cath 08/27/2020   Acute encephalopathy 08/26/2020   Peripheral neuropathy 08/26/2020   Elevated troponin 08/25/2020   Hyperkalemia 08/05/2020   Arthritis    Asthma    Cataract    Cerebral infarction due to thrombosis of cerebral artery ([place])    History of CVA (cerebrovascular accident)    Chronic kidney disease    Dyspnea    Hyperlipidemia    Hypertension    Kidney failure    Restless leg syndrome    Degenerative lumbar spinal stenosis 16/06/9603   Diastolic dysfunction 54/05/8118   Renovascular hypertension 06/22/2020   Demand ischemia ([place]) 06/18/2020   LVH (left ventricular hypertrophy) due to hypertensive disease, with heart failure ([place]) 06/18/2020   Myofascial pain syndrome 06/12/2020   Encounter for removal of sutures 03/29/2020   History of fusion of cervical spine 03/26/2020   Non-compliance with renal dialysis ([place]) 02/08/2020   Acute pulmonary edema ([place]) 01/23/2020   Cervical disc herniation 01/04/2020   Tachycardia 01/04/2020   SOB (shortness of breath)    Hypoxia 12/30/2019   Chronic low back pain 11/24/2019   Degeneration of lumbar intervertebral disc 11/24/2019   Lumbar radiculopathy 11/24/2019   Left shoulder pain 11/11/2019   Anaphylactic shock, unspecified, sequela 06/10/2019   Rib fractures 06/05/2019   Fall at home, initial encounter 06/04/2019   Right rib fracture 06/04/2019   Lung contusion 06/04/2019   Acute respiratory failure with hypoxia ([place]) 06/04/2019   Anemia in ESRD (end-stage renal disease) ([place]) 06/04/2019   GERD without esophagitis 06/04/2019   Chronic diastolic (congestive) heart failure ([place]) 06/04/2019   Fall 06/04/2019   Thoracic ascending aortic aneurysm 06/04/2019   Acute exacerbation of congestive heart failure ([place]) 05/16/2019    Carpal tunnel syndrome of right wrist 10/05/2018   Ulnar neuropathy 10/05/2018   Neuritis of right ulnar nerve 09/13/2018   Pain in joint of right elbow 09/13/2018   Chest pain 04/13/2018   Chronic diastolic heart failure ([place]) 04/13/2018   Acute on chronic diastolic CHF (congestive heart failure) ([place]) 04/13/2018   Atypical chest pain 02/16/2018   ESRD on dialysis ([place]) 02/16/2018   End stage renal disease ([place]) 02/16/2018   CAP (community acquired pneumonia) 10/23/2017   Asthma, chronic, unspecified asthma severity, with acute exacerbation 10/23/2017   Respiratory failure, acute ([place]) 10/23/2017   Pulmonary hypertension ([place]) 10/23/2017   Iron deficiency anemia, unspecified 09/09/2017   Gram-negative sepsis, unspecified ([place]) 09/05/2017   Encounter for immunization 07/08/2017   Moderate protein-calorie malnutrition ([place]) 06/06/2017   Coagulation defect, unspecified ([place]) 06/04/2017   Hypokalemia 06/04/2017   Other hyperlipidemia 06/04/2017   Secondary hyperparathyroidism of renal origin ([place]) 06/04/2017   Macrocytic anemia 05/24/2017   Uremia 05/24/2017   Hypertensive urgency 05/24/2017   Acute kidney injury superimposed on CKD ([place]) 05/24/2017   CKD (chronic kidney disease), stage V ([place]) 05/24/2017   GI bleeding 05/24/2017   Anemia of chronic disease 05/24/2017   Stroke ([place]) 2018   AKI (acute kidney injury) ([place]) 04/10/2015   History of completed stroke  Essential hypertension 04/09/2015    Past Surgical History:  Procedure Laterality Date   A/V FISTULAGRAM N/A 01/01/2021   Procedure: A/V FISTULAGRAM;  Surgeon: Serafina Mitchell, MD;  Location: [facility];  Service: Cardiovascular;  Laterality: N/A;   A/V FISTULAGRAM N/A 01/15/2021   Procedure: A/V JSHFWYOVZCH;  Surgeon: Serafina Mitchell, MD;  Location: [facility];  Service: Cardiovascular;  Laterality: N/A;   ANTERIOR CERVICAL DECOMP/DISCECTOMY FUSION N/A 01/04/2020   Procedure: ANTERIOR CERVICAL  DECOMPRESSION/DISCECTOMY FUSION CERVICAL FIVE THROUGH SEVEN;  Surgeon: Melina Schools, MD;  Location: [employer] [facility];  Service: Orthopedics;  Laterality: N/A;  3 hrs   AV FISTULA PLACEMENT Left 05/28/2017   Procedure: LEFT ARM ARTERIOVENOUS (AV) FISTULA CREATION;  Surgeon: Conrad [name] Creek, MD;  Location: [facility];  Service: Vascular;  Laterality: Left;   AV FISTULA PLACEMENT Left 02/02/2021   Procedure: Debride left forearm, ligation of left upper arm Aretiovenous fistula;  Surgeon: Elam Dutch, MD;  Location: [facility] OR;  Service: Vascular;  Laterality: Left;   AV FISTULA PLACEMENT Right 04/04/2021   Procedure: RIGHT ARTERIOVENOUS (AV) FISTULA CREATION;  Surgeon: Serafina Mitchell, MD;  Location: [facility];  Service: Vascular;  Laterality: Right;   EYE SURGERY Right 06/02/2019   Cataract removed   FRACTURE SURGERY     INSERTION OF DIALYSIS CATHETER Right 02/02/2021   Procedure: INSERTION OF Right Internal jugular TUNNELED  DIALYSIS CATHETER;  Surgeon: Elam Dutch, MD;  Location: [facility];  Service: Vascular;  Laterality: Right;   IR DIALY SHUNT INTRO NEEDLE/INTRACATH INITIAL W/IMG LEFT Left 09/12/2020   IR FLUORO GUIDE CV LINE RIGHT  05/25/2017   IR US GUIDE VASC ACCESS LEFT  09/12/2020   IR US GUIDE VASC ACCESS RIGHT  05/25/2017   LIGATION OF ARTERIOVENOUS  FISTULA Left 01/10/2021   Procedure: LIGATION OF LEFT ARM RADIOCEPHALIC FISTULA;  Surgeon: Serafina Mitchell, MD;  Location: [facility];  Service: Vascular;  Laterality: Left;   PERIPHERAL VASCULAR BALLOON ANGIOPLASTY Left 01/15/2021   Procedure: PERIPHERAL VASCULAR BALLOON ANGIOPLASTY;  Surgeon: Serafina Mitchell, MD;  Location: [facility];  Service: Cardiovascular;  Laterality: Left;  AVF   REVISON OF ARTERIOVENOUS FISTULA Left 07/18/2019   Procedure: REVISION PLICATION OF RADIOCEPHALIC ARTERIOVENOUS FISTULA LEFT ARM;  Surgeon: Angelia Mould, MD;  Location: [facility] OR;  Service: Vascular;  Laterality: Left;   REVISON OF ARTERIOVENOUS FISTULA Left 01/10/2021    Procedure: CONVERSION TO BRACHIOCEPHALIC ARTERIOVENOUS FISTULA;  Surgeon: Serafina Mitchell, MD;  Location: [facility];  Service: Vascular;  Laterality: Left;   RINOPLASTY         Family History  Problem Relation Age of Onset   Cancer Mother     Social History   Tobacco Use   Smoking status: Never   Smokeless tobacco: Never  Vaping Use   Vaping Use: Never used  Substance Use Topics   Alcohol use: Not Currently    Alcohol/week: 0.0 standard drinks    Comment: has a drink once in a while- 12/30/19   Drug use: No    Home Medications Prior to Admission medications   Medication Sig Start Date End Date Taking? Authorizing Provider  acetaminophen (TYLENOL) 500 MG tablet Take 500 mg by mouth every 6 (six) hours as needed for mild pain, moderate pain or headache.    [provider]  albuterol (VENTOLIN HFA) 108 (90 Base) MCG/ACT inhaler Inhale 2 puffs into the lungs 2 (two) times daily as needed for shortness of breath  or wheezing. 06/01/21   Oswald Hillock, MD  amLODipine (NORVASC) 10 MG tablet Take 1 tablet (10 mg total) by mouth daily. 06/01/21   Oswald Hillock, MD  AURYXIA 1 GM 210 MG(Fe) tablet Take 420 mg by mouth 3 (three) times daily with meals. 03/20/21   [provider]  BREO ELLIPTA 100-25 MCG/INH AEPB Inhale 1 puff into the lungs daily. 06/01/21 07/31/21  Oswald Hillock, MD  cinacalcet (SENSIPAR) 60 MG tablet Take 1 tablet (60 mg total) by mouth every evening. 06/01/21   Oswald Hillock, MD  DIPHENHYDRAMINE HCL PO Take 25 mg by mouth as needed (allergies). 09/24/20 09/23/21  [provider]  furosemide (LASIX) 80 MG tablet Take 1 tablet (80 mg total) by mouth daily as needed for fluid (Use only on non-HD days (Tues/Thur/Sat/Sun)). 06/01/21   Oswald Hillock, MD  hydrALAZINE (APRESOLINE) 25 MG tablet Take 2 tablets (50 mg total) by mouth 3 (three) times daily. 06/01/21   Oswald Hillock, MD  metoprolol tartrate (LOPRESSOR) 50 MG tablet Take 1 tablet (50 mg total) by mouth 2  (two) times daily. 06/01/21   Oswald Hillock, MD  multivitamin (RENA-VIT) TABS tablet Take 1 tablet by mouth daily. 01/12/18   [provider]    Allergies    Vancomycin  Review of Systems   Review of Systems  Constitutional:  Positive for chills and fatigue. Negative for fever.  HENT:  Negative for ear pain and sore throat.   Respiratory:  Positive for cough and shortness of breath.   Cardiovascular:  Positive for chest pain.  Gastrointestinal:  Positive for diarrhea and nausea. Negative for abdominal pain, anal bleeding, blood in stool, constipation and vomiting.  Genitourinary:  Negative for dysuria.  Neurological:  Positive for weakness. Negative for syncope.  All other systems reviewed and are negative.  Physical Exam Updated Vital Signs BP 133/78 (BP Location: Left Arm)   Pulse (!) 133   Temp 98.7 F (37.1 C) (Oral)   Resp (!) 34   SpO2 99%   Physical Exam Vitals and nursing note reviewed.  Constitutional:      General: He is not in acute distress. HENT:     Head: Normocephalic and atraumatic.  Eyes:     Pupils: Pupils are equal, round, and reactive to light.  Neck:     Comments: No nuchal rigidity.  Cardiovascular:     Rate and Rhythm: Regular rhythm. Tachycardia present.  Pulmonary:     Effort: Tachypnea present.     Comments: SPO2 99% on nonrebreather, transition to 3 L via nasal cannula maintaining SPO2 in the 90s, when decreased to 2 L via nasal cannula desaturates to upper 80s -placed back on 3 L.  No obvious wheezing. Chest:     Chest wall: Tenderness present.  Abdominal:     Palpations: Abdomen is soft.     Tenderness: There is no abdominal tenderness. There is no guarding or rebound.  Musculoskeletal:     Cervical back: Neck supple.     Comments: 1+ pitting edema to the bilateral lower legs.  No calf tenderness.  Skin:    General: Skin is warm and dry.       Neurological:     Mental Status: He is alert.  Psychiatric:        Mood and  Affect: Mood normal.    ED Results / Procedures / Treatments   Labs (all labs ordered are listed, but only abnormal results are displayed) Labs  Reviewed  RESP PANEL BY RT-PCR (FLU A&B, COVID) [id number]  CBC WITH DIFFERENTIAL/PLATELET  COMPREHENSIVE METABOLIC PANEL  TROPONIN I (HIGH SENSITIVITY)    EKG None  Radiology DG Chest 2 View  Result Date: 06/29/2021 CLINICAL DATA:  Shortness of breath.  Left-sided chest pain.  Cough EXAM: CHEST - 2 VIEW COMPARISON:  Radiograph 05/31/2019 FINDINGS: Right-sided dialysis catheter remains in place. Cardiomegaly again seen. Increase in bilateral pleural effusions, right greater than left. Increased fluid in the right minor fissure. Is peribronchial and interstitial thickening likely represents pulmonary edema. There is no pneumothorax. IMPRESSION: 1. Pulmonary edema and pleural effusions, increased since prior exam. Findings suggest CHF. 2. Cardiomegaly is unchanged. Electronically Signed   By: Keith Rake M.D.   On: 06/29/2021 22:55   CT Angio Chest PE W/Cm &/Or Wo Cm  Result Date: 06/30/2021 CLINICAL DATA:  Chest pain or SOB, pleurisy or effusion suspected. Chest pain, dyspnea, cough. Hypoxia. EXAM: CT ANGIOGRAPHY CHEST WITH CONTRAST TECHNIQUE: Multidetector CT imaging of the chest was performed using the standard protocol during bolus administration of intravenous contrast. Multiplanar CT image reconstructions and MIPs were obtained to evaluate the vascular anatomy. CONTRAST:  34mL OMNIPAQUE IOHEXOL 350 MG/ML SOLN COMPARISON:  None. FINDINGS: Cardiovascular: Adequate opacification of the pulmonary arterial tree. No intraluminal filling defect identified to suggest acute pulmonary embolism. Central pulmonary arteries are of normal caliber. Extensive multi-vessel coronary artery calcification. Mild global cardiomegaly with left ventricular hypertrophy noted. Small pericardial effusion present. Mild atherosclerotic calcification within the thoracic  aorta. The thoracic aorta is dilated in its ascending segment measuring 4.2 cm in greatest dimension. The descending thoracic aorta is of normal caliber. Right internal jugular hemodialysis catheter tip noted within the superior cavoatrial junction. Mediastinum/Nodes: Visualized thyroid is unremarkable. No pathologic thoracic adenopathy. Esophagus is unremarkable. Lungs/Pleura: Moderate right and trace left pleural effusions are present. There is diffuse ground-glass pulmonary infiltrate and smooth interlobular septal thickening in keeping with probable diffuse alveolar and pulmonary interstitial edema. Widespread pneumonic or inflammatory infiltrate is possible but considered less likely. There is, however, collapse and consolidation of the right lower lobe. There is a superimposed rounded region of hypoenhancement within the a right lower lobe, best seen on image # 112/4, suspicious for a a region of parenchymal necrosis in the setting of necrotizing pneumonia or, less likely, a acute to subacute pulmonary infarct. This measures 4.0 x 5.0 cm on axial image # 112/4. no pneumothorax. No central obstructing lesion peer Upper Abdomen: Marked atrophy of the visualized left kidney in keeping with changes of chronic renal insufficiency. No acute abnormality. Musculoskeletal: No acute bone abnormality. No lytic or blastic bone lesion. Review of the MIP images confirms the above findings. IMPRESSION: Collapse and consolidation of the right lower lobe in keeping with changes of lobar pneumonia, with superimposed area of hypoenhancement suspicious for an area of parenchymal necrosis. Less likely, this may represent a acute to subacute infarct. No pulmonary embolism, however, is identified. Superimposed diffuse ground-glass pulmonary infiltrate most suggestive of diffuse alveolar pulmonary edema. Moderate right pleural effusion is nonspecific and may relate to volume overload or represent a parapneumonic effusion. Mild  cardiomegaly with left ventricular hypertrophy. Extensive coronary artery calcification. Small pericardial effusion. Dilation of the ascending aorta with maximal dimension of 4.2 cm. Recommend annual imaging followup by CTA or MRA. This recommendation follows 2010 ACCF/AHA/AATS/ACR/ASA/SCA/SCAI/SIR/STS/SVM Guidelines for the Diagnosis and Management of Patients with Thoracic Aortic Disease. Circulation. 2010; 121: Z610-R604. Aortic aneurysm NOS (ICD10-I71.9) Electronically Signed   By: Fidela Salisbury  M.D.   On: 06/30/2021 01:36    Procedures .Critical Care Performed by: Amaryllis Dyke, PA-C Authorized by: Amaryllis Dyke, PA-C    CRITICAL CARE Performed by: Kennith Maes   Total critical care time: 45 minutes  Critical care time was exclusive of separately billable procedures and treating other patients.  Critical care was necessary to treat or prevent imminent or life-threatening deterioration.  Critical care was time spent personally by me on the following activities: development of treatment plan with patient and/or surrogate as well as nursing, discussions with consultants, evaluation of patient's response to treatment, examination of patient, obtaining history from patient or surrogate, ordering and performing treatments and interventions, ordering and review of laboratory studies, ordering and review of radiographic studies, pulse oximetry and re-evaluation of patient's condition.  Medications Ordered in ED Medications  albuterol (VENTOLIN HFA) 108 (90 Base) MCG/ACT inhaler 2 puff (has no administration in time range)    ED Course  I have reviewed the triage vital signs and the nursing notes.  Pertinent labs & imaging results that were available during my care of the patient were reviewed by me and considered in my medical decision making (see chart for details).    MDM Rules/Calculators/A&P                           Patient presents to the ED with  complaints of weakness.  Patient nontoxic, notably tachycardic, found to be hypoxic into the 70s on room air with EMS, currently on 4 L nasal cannula.  Rectal temp 99.4.  Additional history obtained:  Additional history obtained from chart review & nursing note review.   EKG: Sinus tachycardia  Lab Tests:  I Ordered, reviewed, and interpreted labs, which included:  CBC: Anemia similar to prior on record CMP: Consistent with ESRD, mild hyperkalemia 5.5 Troponin: Mildly elevated, however similar to prior. COVID/flu testing: Negative  Imaging Studies ordered:  I ordered imaging studies which included chest x-ray, I independently reviewed, formal radiology impression shows: 1. Pulmonary edema and pleural effusions, increased since prior exam. Findings suggest CHF. 2. Cardiomegaly is unchanged  ED Course:  Chest x-ray with findings of pulmonary edema and pleural effusions, patient desaturating into the 80s on 4 L via nasal cannula with some increased tachypnea, will check an ABG, CTA, and placed on BiPAP.  On reassessment patient with some improvement in respirations on BiPAP, resting comfortably.  CTA: Collapse and consolidation of the right lower lobe in keeping with changes of lobar pneumonia, with superimposed area of hypoenhancement suspicious for an area of parenchymal necrosis. Less likely, this may represent a acute to subacute infarct. No pulmonary embolism, however, is identified. Superimposed diffuse ground-glass pulmonary infiltrate most suggestive of diffuse alveolar pulmonary edema. Moderate right pleural effusion is nonspecific and may relate to volume overload or represent a parapneumonic effusion. Mild cardiomegaly with left ventricular hypertrophy. Extensive coronary artery calcification. Small pericardial effusion. Dilation of the ascending aorta with maximal dimension of 4.2 cm  CT angio with findings of lobar pneumonia and possible parenchymal necrosis-HCAP antibiotics started  with plan for blood cultures and lactic acid level.  02:20: Patient requesting trial off bipap as it is starting to irritate him- transitioned to 4L via [place] and will monitor.   Will discuss with critical care for admission per discussion w/ attending.   02:30: CONSULT: Discussed with intensivist Dr. Carson Myrtle- will see patient in the ED.   03:20: Re-discussed with Dr. Carson Myrtle- recommends small  dose of morphine, 500 cc fluid bolus, and will admit. Orders placed.   This is a shared visit with supervising physician Dr. Wyvonnia Dusky who has independently evaluated patient & provided guidance in evaluation/management/disposition, in agreement with care    Portions of this note were generated with Dragon dictation software. Dictation errors may occur despite best attempts at proofreading.  Final Clinical Impression(s) / ED Diagnoses Final diagnoses:  Lobar pneumonia ([place])  Pleural effusion  Acute on chronic respiratory failure, unspecified whether with hypoxia or hypercapnia [facility])    Rx / DC Orders ED Discharge Orders     None        Amaryllis Dyke, PA-C 06/30/21 0346    Amaryllis Dyke, PA-C 06/30/21 0346    Ezequiel Essex, MD 06/30/21 [phone]

## 2021-06-29 NOTE — ED Triage Notes (Signed)
Patient BIB EMS from home. 2L [place] baseline. Patient called out for SOB and chest pain. CP subsided. Patient with a cough. 72% RA. Dialysis yesterday. Patient was not on his oxygen on EMS arrival due to weakness, could not get to his O2

## 2021-06-30 ENCOUNTER — Encounter (HOSPITAL_COMMUNITY): Payer: Self-pay

## 2021-06-30 ENCOUNTER — Emergency Department (HOSPITAL_COMMUNITY): Payer: Medicare Other

## 2021-06-30 DIAGNOSIS — R569 Unspecified convulsions: Secondary | ICD-10-CM | POA: Diagnosis not present

## 2021-06-30 DIAGNOSIS — J90 Pleural effusion, not elsewhere classified: Secondary | ICD-10-CM

## 2021-06-30 DIAGNOSIS — E871 Hypo-osmolality and hyponatremia: Secondary | ICD-10-CM | POA: Diagnosis present

## 2021-06-30 DIAGNOSIS — R6521 Severe sepsis with septic shock: Secondary | ICD-10-CM | POA: Diagnosis present

## 2021-06-30 DIAGNOSIS — I132 Hypertensive heart and chronic kidney disease with heart failure and with stage 5 chronic kidney disease, or end stage renal disease: Secondary | ICD-10-CM | POA: Diagnosis present

## 2021-06-30 DIAGNOSIS — R918 Other nonspecific abnormal finding of lung field: Secondary | ICD-10-CM | POA: Diagnosis not present

## 2021-06-30 DIAGNOSIS — K7689 Other specified diseases of liver: Secondary | ICD-10-CM | POA: Diagnosis present

## 2021-06-30 DIAGNOSIS — Z20822 Contact with and (suspected) exposure to covid-19: Secondary | ICD-10-CM | POA: Diagnosis present

## 2021-06-30 DIAGNOSIS — J181 Lobar pneumonia, unspecified organism: Secondary | ICD-10-CM | POA: Diagnosis present

## 2021-06-30 DIAGNOSIS — I4892 Unspecified atrial flutter: Secondary | ICD-10-CM | POA: Diagnosis not present

## 2021-06-30 DIAGNOSIS — D631 Anemia in chronic kidney disease: Secondary | ICD-10-CM | POA: Diagnosis present

## 2021-06-30 DIAGNOSIS — I272 Pulmonary hypertension, unspecified: Secondary | ICD-10-CM | POA: Diagnosis present

## 2021-06-30 DIAGNOSIS — K760 Fatty (change of) liver, not elsewhere classified: Secondary | ICD-10-CM | POA: Diagnosis present

## 2021-06-30 DIAGNOSIS — R609 Edema, unspecified: Secondary | ICD-10-CM | POA: Diagnosis not present

## 2021-06-30 DIAGNOSIS — R9389 Abnormal findings on diagnostic imaging of other specified body structures: Secondary | ICD-10-CM | POA: Diagnosis present

## 2021-06-30 DIAGNOSIS — D539 Nutritional anemia, unspecified: Secondary | ICD-10-CM | POA: Diagnosis present

## 2021-06-30 DIAGNOSIS — E2740 Unspecified adrenocortical insufficiency: Secondary | ICD-10-CM | POA: Diagnosis present

## 2021-06-30 DIAGNOSIS — I080 Rheumatic disorders of both mitral and aortic valves: Secondary | ICD-10-CM | POA: Diagnosis present

## 2021-06-30 DIAGNOSIS — G2581 Restless legs syndrome: Secondary | ICD-10-CM | POA: Diagnosis present

## 2021-06-30 DIAGNOSIS — J9620 Acute and chronic respiratory failure, unspecified whether with hypoxia or hypercapnia: Secondary | ICD-10-CM | POA: Diagnosis not present

## 2021-06-30 DIAGNOSIS — A419 Sepsis, unspecified organism: Secondary | ICD-10-CM | POA: Diagnosis present

## 2021-06-30 DIAGNOSIS — N186 End stage renal disease: Secondary | ICD-10-CM

## 2021-06-30 DIAGNOSIS — R6510 Systemic inflammatory response syndrome (SIRS) of non-infectious origin without acute organ dysfunction: Secondary | ICD-10-CM | POA: Diagnosis present

## 2021-06-30 DIAGNOSIS — Z992 Dependence on renal dialysis: Secondary | ICD-10-CM | POA: Diagnosis not present

## 2021-06-30 DIAGNOSIS — I5032 Chronic diastolic (congestive) heart failure: Secondary | ICD-10-CM | POA: Diagnosis present

## 2021-06-30 DIAGNOSIS — J9621 Acute and chronic respiratory failure with hypoxia: Secondary | ICD-10-CM | POA: Diagnosis present

## 2021-06-30 DIAGNOSIS — E873 Alkalosis: Secondary | ICD-10-CM | POA: Diagnosis present

## 2021-06-30 DIAGNOSIS — D689 Coagulation defect, unspecified: Secondary | ICD-10-CM | POA: Diagnosis present

## 2021-06-30 DIAGNOSIS — J918 Pleural effusion in other conditions classified elsewhere: Secondary | ICD-10-CM | POA: Diagnosis present

## 2021-06-30 DIAGNOSIS — R64 Cachexia: Secondary | ICD-10-CM | POA: Diagnosis present

## 2021-06-30 DIAGNOSIS — I248 Other forms of acute ischemic heart disease: Secondary | ICD-10-CM | POA: Diagnosis present

## 2021-06-30 LAB — PROTIME-INR
INR: 1.3 — ABNORMAL HIGH (ref 0.8–1.2)
Prothrombin Time: 15.9 seconds — ABNORMAL HIGH (ref 11.4–15.2)

## 2021-06-30 LAB — BLOOD GAS, ARTERIAL
Acid-Base Excess: 2.9 mmol/L — ABNORMAL HIGH (ref 0.0–2.0)
Bicarbonate: 25.8 mmol/L (ref 20.0–28.0)
O2 Saturation: 97.2 %
Patient temperature: 98.6
pCO2 arterial: 34.3 mmHg (ref 32.0–48.0)
pH, Arterial: 7.489 — ABNORMAL HIGH (ref 7.350–7.450)
pO2, Arterial: 90.4 mmHg (ref 83.0–108.0)

## 2021-06-30 LAB — CBC
HCT: 27.4 % — ABNORMAL LOW (ref 39.0–52.0)
Hemoglobin: 8.7 g/dL — ABNORMAL LOW (ref 13.0–17.0)
MCH: 33.1 pg (ref 26.0–34.0)
MCHC: 31.8 g/dL (ref 30.0–36.0)
MCV: 104.2 fL — ABNORMAL HIGH (ref 80.0–100.0)
Platelets: 131 10*3/uL — ABNORMAL LOW (ref 150–400)
RBC: 2.63 MIL/uL — ABNORMAL LOW (ref 4.22–5.81)
RDW: 18.4 % — ABNORMAL HIGH (ref 11.5–15.5)
WBC: 4.3 10*3/uL (ref 4.0–10.5)
nRBC: 0 % (ref 0.0–0.2)

## 2021-06-30 LAB — BASIC METABOLIC PANEL
Anion gap: 15 (ref 5–15)
BUN: 61 mg/dL — ABNORMAL HIGH (ref 8–23)
CO2: 24 mmol/L (ref 22–32)
Calcium: 9.2 mg/dL (ref 8.9–10.3)
Chloride: 98 mmol/L (ref 98–111)
Creatinine, Ser: 7.63 mg/dL — ABNORMAL HIGH (ref 0.61–1.24)
GFR, Estimated: 7 mL/min — ABNORMAL LOW (ref >60)
Glucose, Bld: 90 mg/dL (ref 70–99)
Potassium: 4.2 mmol/L (ref 3.5–5.1)
Sodium: 137 mmol/L (ref 135–145)

## 2021-06-30 LAB — COMPREHENSIVE METABOLIC PANEL
ALT: 6 U/L (ref 0–44)
AST: 61 U/L — ABNORMAL HIGH (ref 15–41)
Albumin: 3.3 g/dL — ABNORMAL LOW (ref 3.5–5.0)
Alkaline Phosphatase: 80 U/L (ref 38–126)
Anion gap: 15 (ref 5–15)
BUN: 55 mg/dL — ABNORMAL HIGH (ref 8–23)
CO2: 27 mmol/L (ref 22–32)
Calcium: 9.3 mg/dL (ref 8.9–10.3)
Chloride: 95 mmol/L — ABNORMAL LOW (ref 98–111)
Creatinine, Ser: 6.71 mg/dL — ABNORMAL HIGH (ref 0.61–1.24)
GFR, Estimated: 9 mL/min — ABNORMAL LOW (ref >60)
Glucose, Bld: 107 mg/dL — ABNORMAL HIGH (ref 70–99)
Potassium: 5.5 mmol/L — ABNORMAL HIGH (ref 3.5–5.1)
Sodium: 137 mmol/L (ref 135–145)
Total Bilirubin: 1.4 mg/dL — ABNORMAL HIGH (ref 0.3–1.2)
Total Protein: 7.4 g/dL (ref 6.5–8.1)

## 2021-06-30 LAB — LACTIC ACID, PLASMA
Lactic Acid, Venous: 0.7 mmol/L (ref 0.5–1.9)
Lactic Acid, Venous: 0.8 mmol/L (ref 0.5–1.9)

## 2021-06-30 LAB — PROCALCITONIN: Procalcitonin: 0.96 ng/mL

## 2021-06-30 LAB — TROPONIN I (HIGH SENSITIVITY)
Troponin I (High Sensitivity): 62 ng/L — ABNORMAL HIGH (ref <18)
Troponin I (High Sensitivity): 67 ng/L — ABNORMAL HIGH (ref <18)

## 2021-06-30 LAB — MRSA NEXT GEN BY PCR, NASAL: MRSA by PCR Next Gen: NOT DETECTED

## 2021-06-30 LAB — APTT: aPTT: 37 seconds — ABNORMAL HIGH (ref 24–36)

## 2021-06-30 LAB — MAGNESIUM: Magnesium: 2.4 mg/dL (ref 1.7–2.4)

## 2021-06-30 MED ORDER — PANTOPRAZOLE SODIUM 40 MG IV SOLR
40.0000 mg | Freq: Every day | INTRAVENOUS | Status: DC
  Administered 2021-06-30 – 2021-07-04 (×5): 40 mg via INTRAVENOUS
  Filled 2021-06-30 (×5): qty 40

## 2021-06-30 MED ORDER — ACETAMINOPHEN 325 MG PO TABS
650.0000 mg | ORAL_TABLET | Freq: Once | ORAL | Status: AC
  Administered 2021-06-30: 650 mg via ORAL
  Filled 2021-06-30: qty 2

## 2021-06-30 MED ORDER — IOHEXOL 350 MG/ML SOLN
75.0000 mL | Freq: Once | INTRAVENOUS | Status: AC | PRN
  Administered 2021-06-30: 75 mL via INTRAVENOUS

## 2021-06-30 MED ORDER — SEVELAMER CARBONATE 800 MG PO TABS
1600.0000 mg | ORAL_TABLET | Freq: Three times a day (TID) | ORAL | Status: DC
  Administered 2021-06-30 – 2021-07-05 (×3): 1600 mg via ORAL
  Filled 2021-06-30 (×9): qty 2

## 2021-06-30 MED ORDER — HEPARIN SODIUM (PORCINE) 5000 UNIT/ML IJ SOLN
5000.0000 [IU] | Freq: Three times a day (TID) | INTRAMUSCULAR | Status: DC
Start: 2021-06-30 — End: 2021-07-04
  Administered 2021-06-30 – 2021-07-04 (×13): 5000 [IU] via SUBCUTANEOUS
  Filled 2021-06-30 (×13): qty 1

## 2021-06-30 MED ORDER — SODIUM CHLORIDE 0.9 % IV SOLN
2.0000 g | Freq: Once | INTRAVENOUS | Status: AC
  Administered 2021-06-30: 2 g via INTRAVENOUS
  Filled 2021-06-30: qty 2

## 2021-06-30 MED ORDER — ALBUTEROL SULFATE (2.5 MG/3ML) 0.083% IN NEBU: 2.5000 mg | INHALATION_SOLUTION | RESPIRATORY_TRACT | Status: DC | PRN

## 2021-06-30 MED ORDER — ONDANSETRON HCL 4 MG/2ML IJ SOLN
4.0000 mg | Freq: Four times a day (QID) | INTRAMUSCULAR | Status: DC | PRN
  Administered 2021-06-30 – 2021-07-01 (×3): 4 mg via INTRAVENOUS
  Filled 2021-06-30 (×3): qty 2

## 2021-06-30 MED ORDER — FERRIC CITRATE 1 GM 210 MG(FE) PO TABS
420.0000 mg | ORAL_TABLET | Freq: Three times a day (TID) | ORAL | Status: DC
  Administered 2021-06-30 – 2021-07-05 (×3): 420 mg via ORAL
  Filled 2021-06-30 (×8): qty 2

## 2021-06-30 MED ORDER — ACETAMINOPHEN 325 MG PO TABS
650.0000 mg | ORAL_TABLET | ORAL | Status: DC | PRN
  Administered 2021-06-30 – 2021-07-03 (×5): 650 mg via ORAL
  Filled 2021-06-30 (×5): qty 2

## 2021-06-30 MED ORDER — FUROSEMIDE 10 MG/ML IJ SOLN
40.0000 mg | Freq: Once | INTRAMUSCULAR | Status: AC
  Administered 2021-06-30: 40 mg via INTRAVENOUS
  Filled 2021-06-30: qty 4

## 2021-06-30 MED ORDER — BENZONATATE 100 MG PO CAPS
200.0000 mg | ORAL_CAPSULE | Freq: Four times a day (QID) | ORAL | Status: DC | PRN
  Administered 2021-06-30: 200 mg via ORAL
  Filled 2021-06-30 (×3): qty 2

## 2021-06-30 MED ORDER — CINACALCET HCL 30 MG PO TABS
60.0000 mg | ORAL_TABLET | Freq: Every evening | ORAL | Status: DC
  Administered 2021-06-30 – 2021-07-03 (×3): 60 mg via ORAL
  Filled 2021-06-30 (×5): qty 2

## 2021-06-30 MED ORDER — DOCUSATE SODIUM 100 MG PO CAPS: 100.0000 mg | ORAL_CAPSULE | Freq: Two times a day (BID) | ORAL | Status: DC | PRN

## 2021-06-30 MED ORDER — MORPHINE SULFATE (PF) 2 MG/ML IV SOLN
2.0000 mg | INTRAVENOUS | Status: DC | PRN
  Administered 2021-06-30 (×2): 4 mg via INTRAVENOUS
  Administered 2021-07-01: 2 mg via INTRAVENOUS
  Filled 2021-06-30: qty 2
  Filled 2021-06-30: qty 1
  Filled 2021-06-30: qty 2

## 2021-06-30 MED ORDER — METOPROLOL TARTRATE 25 MG PO TABS
50.0000 mg | ORAL_TABLET | Freq: Two times a day (BID) | ORAL | Status: DC
  Administered 2021-06-30 (×2): 50 mg via ORAL
  Filled 2021-06-30 (×2): qty 2

## 2021-06-30 MED ORDER — HYDRALAZINE HCL 50 MG PO TABS
50.0000 mg | ORAL_TABLET | Freq: Three times a day (TID) | ORAL | Status: DC
  Administered 2021-06-30 (×3): 50 mg via ORAL
  Filled 2021-06-30 (×3): qty 1

## 2021-06-30 MED ORDER — ALPRAZOLAM 0.25 MG PO TABS
0.2500 mg | ORAL_TABLET | Freq: Once | ORAL | Status: AC
  Administered 2021-06-30: 0.25 mg via ORAL
  Filled 2021-06-30: qty 1

## 2021-06-30 MED ORDER — ALBUTEROL SULFATE (2.5 MG/3ML) 0.083% IN NEBU
2.5000 mg | INHALATION_SOLUTION | Freq: Four times a day (QID) | RESPIRATORY_TRACT | Status: DC
  Administered 2021-06-30 – 2021-07-04 (×14): 2.5 mg via RESPIRATORY_TRACT
  Filled 2021-06-30 (×16): qty 3

## 2021-06-30 MED ORDER — BUDESONIDE 0.25 MG/2ML IN SUSP
0.2500 mg | Freq: Two times a day (BID) | RESPIRATORY_TRACT | Status: DC
  Administered 2021-06-30 – 2021-07-05 (×11): 0.25 mg via RESPIRATORY_TRACT
  Filled 2021-06-30 (×11): qty 2

## 2021-06-30 MED ORDER — MORPHINE SULFATE (PF) 2 MG/ML IV SOLN
2.0000 mg | INTRAVENOUS | Status: DC | PRN
Start: 2021-06-30 — End: 2021-06-30
  Administered 2021-06-30: 2 mg via INTRAVENOUS
  Filled 2021-06-30: qty 1

## 2021-06-30 MED ORDER — LINEZOLID 600 MG/300ML IV SOLN
600.0000 mg | Freq: Two times a day (BID) | INTRAVENOUS | Status: DC
  Administered 2021-06-30 – 2021-07-01 (×3): 600 mg via INTRAVENOUS
  Filled 2021-06-30 (×3): qty 300

## 2021-06-30 MED ORDER — SODIUM CHLORIDE 0.9 % IV BOLUS
500.0000 mL | Freq: Once | INTRAVENOUS | Status: AC
  Administered 2021-06-30: 500 mL via INTRAVENOUS

## 2021-06-30 MED ORDER — HYDROCOD POLST-CPM POLST ER 10-8 MG/5ML PO SUER
5.0000 mL | Freq: Two times a day (BID) | ORAL | Status: DC | PRN
  Administered 2021-06-30: 5 mL via ORAL
  Filled 2021-06-30: qty 5

## 2021-06-30 MED ORDER — POLYETHYLENE GLYCOL 3350 17 G PO PACK: 17.0000 g | PACK | Freq: Every day | ORAL | Status: DC | PRN

## 2021-06-30 MED ORDER — PIPERACILLIN-TAZOBACTAM IN DEX 2-0.25 GM/50ML IV SOLN
2.2500 g | Freq: Three times a day (TID) | INTRAVENOUS | Status: DC
  Administered 2021-06-30 – 2021-07-01 (×5): 2.25 g via INTRAVENOUS
  Filled 2021-06-30 (×9): qty 50

## 2021-06-30 MED ORDER — RENA-VITE PO TABS
1.0000 | ORAL_TABLET | Freq: Every day | ORAL | Status: DC
  Administered 2021-06-30 – 2021-07-04 (×3): 1 via ORAL
  Filled 2021-06-30 (×4): qty 1

## 2021-06-30 MED ORDER — ORAL CARE MOUTH RINSE
15.0000 mL | Freq: Two times a day (BID) | OROMUCOSAL | Status: DC
  Administered 2021-06-30 – 2021-07-04 (×5): 15 mL via OROMUCOSAL

## 2021-06-30 MED ORDER — AMLODIPINE BESYLATE 10 MG PO TABS
10.0000 mg | ORAL_TABLET | Freq: Every day | ORAL | Status: DC
  Administered 2021-06-30: 10 mg via ORAL
  Filled 2021-06-30 (×2): qty 1

## 2021-06-30 MED ORDER — CHLORHEXIDINE GLUCONATE CLOTH 2 % EX PADS
6.0000 | MEDICATED_PAD | Freq: Every day | CUTANEOUS | Status: DC
  Administered 2021-06-30 – 2021-07-03 (×4): 6 via TOPICAL

## 2021-06-30 MED ORDER — MORPHINE SULFATE (PF) 2 MG/ML IV SOLN
2.0000 mg | Freq: Once | INTRAVENOUS | Status: AC
  Administered 2021-06-30: 2 mg via INTRAVENOUS
  Filled 2021-06-30: qty 1

## 2021-06-30 NOTE — Progress Notes (Signed)
A consult was received from an ED physician for cefepime and vancomycin per pharmacy dosing.  The patient's profile has been reviewed for ht/wt/allergies/indication/available labs.   A one time order has been placed for cefepime 2 g. Notified EDP that patient has a vancomycin allergy.  Further antibiotics/pharmacy consults should be ordered by admitting physician if indicated.                       Thank you, Napoleon Form 06/30/2021  2:17 AM

## 2021-06-30 NOTE — H&P (Addendum)
NAME:  Thomas Robinson MRN:  [id number] DOB:  [birth year] LOS: 0 ADMISSION DATE:  06/29/2021 DATE OF SERVICE:  06/30/2021  CHIEF COMPLAINT:  weakness, hypoxia   HISTORY & PHYSICAL  History of Present Illness  This 60 y.o. Caucasian male nonsmoker presented to the [facility] Emergency Department via EMS with complaints of generalized weakness. Per EMS report, they found the patient off his oxygen with SpO2 72%.  He states that he has felt weak for several days (approximately one week), during which time he continued to present for hemodialysis.  He states that he was experiencing generalized weakness and severe exertional dyspnea that was associated with chest discomfort and tightness along with abdominal discomfort.  At present, he only endorses fatigue, weakness and dyspnea.  He also states that he was having coughing spells that were largely nonproductive and when productive only nonpurulent secretions were noted.  He also complains of generalized pain "all over" which he states has been a problem since he ran out of "pain meds" about a week ago, which were apparently prescribed to him after back surgery in August 2022.  He also adds that his girlfriend has been telling him that he has had episodes of delirium while at home.  In the ER, the patient has been supported with supplemental oxygen.  He uses 2.5 LPM at home (unknown diagnosis).  He was increased to 5 LPM in the ER and remained hypoxic, so BiPAP was initiated.  At the time of clinical interview, the patient is off BiPAP and back on 5 LPM.  SpO2 94-95% but RR 30.  HR 130.  Chest CTA was obtained in the ER which showed no pulmonary embolism but did identify (moderate right, trace left) pleural effusions; diffuse ground-glass opacities and wmooth interlobular septal thickening; RLL consolidation with a hypodensity measuring 4x5 cm; small pericardial effusion.  REVIEW OF SYSTEMS Constitutional: No weight loss. No night sweats. No fever. No  chills. No fatigue. HEENT: No headaches, dysphagia, sore throat, otalgia, nasal congestion, PND CV: Chest pain/discomfort, no radiation.  Orthopnea.  Palpitations, "heart pounding".  No paroxysmal nocturnal dyspnea, swelling in lower extremities. GI:  Vague abdominal pain.  No nausea, vomiting, diarrhea, change in bowel pattern, anorexia Resp: Esertional dyspnea.  Nonproductive cough.  No rest dyspnea, hemoptysis, wheezing  GU: ESRD on HD but still voids 2-3 times per day.  No dysuria, change in color of urine, no urgency or frequency.  No flank pain. MS:  No joint pain or swelling. No myalgias,  No decreased range of motion.  Psych:  Delirium reported by girlfriend.  No change in mood or affect. No memory loss. Skin: no rash or lesions.   Past Medical/Surgical/Social/Family History   Past Medical History:  Diagnosis Date   Anaphylactic shock, unspecified, sequela 06/10/2019   Aortic stenosis    mild-moderate AS 12/2020 echo   Arthritis    Asthma, chronic, unspecified asthma severity, with acute exacerbation 10/23/2017   CAP (community acquired pneumonia) 10/23/2017   Carpal tunnel syndrome of right wrist 10/05/2018   Cataract    right - removed by surgery   Cerebral infarction due to thrombosis of cerebral artery (HCC)    Cervical disc herniation 01/04/2020   Chronic diastolic heart failure ([place]) 04/13/2018   Chronic low back pain 11/24/2019   Constipation    Cough    chronic cough   Dyspnea    Encounter for immunization 07/08/2017   ESRD on hemodialysis ([place]) 02/16/2018   Fall 06/04/2019   GERD (  gastroesophageal reflux disease) 06/04/2019   GI bleeding 05/24/2017   Gram-negative sepsis, unspecified ([place]) 09/05/2017   History of fusion of cervical spine 03/26/2020   Hyperlipidemia    Hypertension    Hypokalemia 06/04/2017   Iron deficiency anemia, unspecified 09/09/2017   Left shoulder pain 11/11/2019   Lumbar radiculopathy 11/24/2019   Lung contusion 06/04/2019   LVH  (left ventricular hypertrophy) due to hypertensive disease, with heart failure ([place]) 06/18/2020   Macrocytic anemia 05/24/2017   Moderate protein-calorie malnutrition ([place]) 06/06/2017   Myofascial pain syndrome 06/12/2020   Neuritis of right ulnar nerve 09/13/2018   Non-compliance with renal dialysis ([place]) 02/08/2020   Oxygen deficiency 12/30/2019   O2 sats on RA 87% at PAT appt    Pain in joint of right elbow 09/13/2018   Pulmonary hypertension ([place])    Renovascular hypertension 06/22/2020   Restless leg syndrome    Rib fractures 06/2019   Right   S/P cardiac cath 08/27/2020   normal coronary arteries.   Secondary hyperparathyroidism of renal origin ([place]) 06/04/2017   Thoracic ascending aortic aneurysm    4.5 cm 06/04/19 CT   Ulnar neuropathy 10/05/2018   Volume overload 10/23/2017    Past Surgical History:  Procedure Laterality Date   A/V FISTULAGRAM N/A 01/01/2021   Procedure: A/V FISTULAGRAM;  Surgeon: Serafina Mitchell, MD;  Location: [place] [facility];  Service: Cardiovascular;  Laterality: N/A;   A/V FISTULAGRAM N/A 01/15/2021   Procedure: A/V [id number];  Surgeon: Serafina Mitchell, MD;  Location: [place] [facility];  Service: Cardiovascular;  Laterality: N/A;   ANTERIOR CERVICAL DECOMP/DISCECTOMY FUSION N/A 01/04/2020   Procedure: ANTERIOR CERVICAL DECOMPRESSION/DISCECTOMY FUSION CERVICAL FIVE THROUGH SEVEN;  Surgeon: Melina Schools, MD;  Location: [place];  Service: Orthopedics;  Laterality: N/A;  3 hrs   AV FISTULA PLACEMENT Left 05/28/2017   Procedure: LEFT ARM ARTERIOVENOUS (AV) FISTULA CREATION;  Surgeon: Conrad Herriman, MD;  Location: [place];  Service: Vascular;  Laterality: Left;   AV FISTULA PLACEMENT Left 02/02/2021   Procedure: Debride left forearm, ligation of left upper arm Aretiovenous fistula;  Surgeon: Elam Dutch, MD;  Location: [facility] OR;  Service: Vascular;  Laterality: Left;   AV FISTULA PLACEMENT Right 04/04/2021   Procedure: RIGHT ARTERIOVENOUS (AV) FISTULA  CREATION;  Surgeon: Serafina Mitchell, MD;  [facility];  Service: Vascular;  Laterality: Right;   EYE SURGERY Right 06/02/2019   Cataract removed   FRACTURE SURGERY     INSERTION OF DIALYSIS CATHETER Right 02/02/2021   Procedure: INSERTION OF Right Internal jugular TUNNELED  DIALYSIS CATHETER;  Surgeon: Elam Dutch, MD;  Location: [place];  Service: Vascular;  Laterality: Right;   IR DIALY SHUNT INTRO NEEDLE/INTRACATH INITIAL W/IMG LEFT Left 09/12/2020   IR FLUORO GUIDE CV LINE RIGHT  05/25/2017   IR US GUIDE VASC ACCESS LEFT  09/12/2020   IR US GUIDE VASC ACCESS RIGHT  05/25/2017   LIGATION OF ARTERIOVENOUS  FISTULA Left 01/10/2021   Procedure: LIGATION OF LEFT ARM RADIOCEPHALIC FISTULA;  Surgeon: Serafina Mitchell, MD;  Location: [place];  Service: Vascular;  Laterality: Left;   PERIPHERAL VASCULAR BALLOON ANGIOPLASTY Left 01/15/2021   Procedure: PERIPHERAL VASCULAR BALLOON ANGIOPLASTY;  Surgeon: Serafina Mitchell, MD;  Location: [place] [facility];  Service: Cardiovascular;  Laterality: Left;  AVF   REVISON OF ARTERIOVENOUS FISTULA Left 07/18/2019   Procedure: REVISION PLICATION OF RADIOCEPHALIC ARTERIOVENOUS FISTULA LEFT ARM;  Surgeon: Angelia Mould, MD;  Location: [place];  Service: Vascular;  Laterality: Left;   REVISON OF ARTERIOVENOUS FISTULA Left 01/10/2021   Procedure: CONVERSION TO BRACHIOCEPHALIC ARTERIOVENOUS FISTULA;  Surgeon: Serafina Mitchell, MD;  [facility];  Service: Vascular;  Laterality: Left;   RINOPLASTY      Social History   Tobacco Use   Smoking status: Never   Smokeless tobacco: Never  Substance Use Topics   Alcohol use: Not Currently    Alcohol/week: 0.0 standard drinks    Comment: has a drink once in a while- 12/30/19    Family History  Problem Relation Age of Onset   Cancer Mother      Procedures:     Significant Diagnostic Tests:     Micro Data:   Results for orders placed or performed during the hospital encounter of 06/29/21  Resp  Panel by RT-PCR (Flu A&B, Covid) Nasopharyngeal Swab     Status: None   Collection Time: 06/29/21  9:56 PM   Specimen: Nasopharyngeal Swab; Nasopharyngeal(NP) swabs in vial transport medium  Result Value Ref Range Status   SARS Coronavirus 2 by RT PCR NEGATIVE NEGATIVE Final    Comment: (NOTE) SARS-CoV-2 target nucleic acids are NOT DETECTED.  The SARS-CoV-2 RNA is generally detectable in upper respiratory specimens during the acute phase of infection. The lowest concentration of SARS-CoV-2 viral copies this assay can detect is 138 copies/mL. A negative result does not preclude SARS-Cov-2 infection and should not be used as the sole basis for treatment or other patient management decisions. A negative result may occur with  improper specimen collection/handling, submission of specimen other than nasopharyngeal swab, presence of viral mutation(s) within the areas targeted by this assay, and inadequate number of viral copies(<138 copies/mL). A negative result must be combined with clinical observations, patient history, and epidemiological information. The expected result is Negative.  Fact Sheet for Patients:  EntrepreneurPulse.com.au  Fact Sheet for Healthcare Providers:  IncredibleEmployment.be  This test is no t yet approved or cleared by the [place] FDA and  has been authorized for detection and/or diagnosis of SARS-CoV-2 by FDA under an Emergency Use Authorization (EUA). This EUA will remain  in effect (meaning this test can be used) for the duration of the COVID-19 declaration under Section 564(b)(1) of the Act, 21 U.S.C.section 360bbb-3(b)(1), unless the authorization is terminated  or revoked sooner.       Influenza A by PCR NEGATIVE NEGATIVE Final   Influenza B by PCR NEGATIVE NEGATIVE Final    Comment: (NOTE) The Xpert Xpress SARS-CoV-2/FLU/RSV plus assay is intended as an aid in the diagnosis of influenza from Nasopharyngeal  swab specimens and should not be used as a sole basis for treatment. Nasal washings and aspirates are unacceptable for Xpert Xpress SARS-CoV-2/FLU/RSV testing.  Fact Sheet for Patients: EntrepreneurPulse.com.au  Fact Sheet for Healthcare Providers: IncredibleEmployment.be  This test is not yet approved or cleared by the [place] FDA and has been authorized for detection and/or diagnosis of SARS-CoV-2 by FDA under an Emergency Use Authorization (EUA). This EUA will remain in effect (meaning this test can be used) for the duration of the COVID-19 declaration under Section 564(b)(1) of the Act, 21 U.S.C. section 360bbb-3(b)(1), unless the authorization is terminated or revoked.  Performed at [facility], [place] [address] [place], [place] [address]       Antimicrobials:  Cefepime/vancomycin (10/30)    Interim history/subjective:     Objective   BP (!) 141/87   Pulse (!) 131   Temp 99.4 F (37.4  C) (Rectal)   Resp (!) 34   SpO2 92%     There were no vitals filed for this visit. No intake or output data in the 24 hours ending 06/30/21 0414      Examination: GENERAL: alert, oriented to time, person and place, cachectic. No acute distress. HEAD: normocephalic, atraumatic EYE: PERRLA, EOM intact, no scleral icterus, no pallor. NOSE: nares are patent. No polyps. No exudate.  THROAT/ORAL CAVITY: Normal dentition. No oral thrush. No exudate. Mucous membranes are dry. No tonsillar enlargement.   NECK: supple, no thyromegaly, no JVD, no lymphadenopathy. Trachea midline. CHEST/LUNG: symmetric in development and expansion.  Decreased air entry in the R base.  Bilateral crackles (R>>L).  No wheezes. HEART: Regular S1 and S2 with 3/6 systolic murmur in the mitral area, rub or gallop.  Tachycardic. ABDOMEN: soft, nontender, nondistended. Normoactive bowel sounds. No rebound. No guarding. No  hepatosplenomegaly. EXTREMITIES: Edema: none. No cyanosis. No clubbing. 2+ DP pulses LYMPHATIC: no cervical/axillary/inguinal lymph nodes appreciated MUSCULOSKELETAL: No point tenderness. No bulk atrophy. Joints: normal inspection.  SKIN:  Dry.  Tenting.  No rash or lesion. NEUROLOGIC: Doll's eyes intact. Corneal reflex intact. Spontaneous respirations intact. Cranial nerves II-XII are grossly symmetric and physiologic. Babinski absent. No sensory deficit. Motor: 5/5 @ RUE, 5/5 @ LUE, 5/5 @ RLL,  5/5 @ LLL.  DTR: 2+ @ R biceps, 2+ @ L biceps, 2+ @ R patellar,  2+ @ L patellar. No cerebellar signs. Gait was not assessed.   Resolved Hospital Problem list      Assessment & Plan:   ASSESSMENT/PLAN:  ASSESSMENT (included in the Hospital Problem List)  Principal Problem:   Acute and chronic respiratory failure with hypoxia ([place]) Active Problems:   SIRS (systemic inflammatory response syndrome) (HCC)   ESRD on dialysis ([place])   Chronic diastolic heart failure (HCC)   Pulmonary edema   Pleural effusion, right   Macrocytic anemia   Abnormal chest CT   By systems: PULMONARY Acute on chronic hypoxemic respiratory failure Tachypnea with primary respiratory alkalosis (and concurrent metabolic alkalosis) Acute pulmonary edema Bilateral (R>>L) pleural effusion RLL consolidation with 4x5 cm area of hypoattenuation/abnormal chest CT Asthma, by history Clinically, while this patient fulfills SIRS criteria, I suspect he may be experiencing withdrawal from opiates based on his report that he ran out of his pain medications. Morphine 2 mg IV q 3 hr PRN Supplemental oxygen as needed to maintain SpO2 93+% Empiric coverage for healthcare-associated pneumonia with Zosyn/linezolid (recent hospitalization; vancomycin allergy/intolerance). R-sided thoracentesis may need to be pursued for diagnostic evaluation (parapneumonic effusion vs malignancy vs transudative processes) If the patient is able to  produce sputum, it should be sent for Gram stain, C/S and cytology.  Nonproductive cough at this time. Albuterol/Pulmicort for now   CARDIOVASCULAR Tachycardia Chronic diastolic heart failure Hypertension Aortic stenosis Mitral regurgitation Again, compatible with SIRS, but on exam, he is dehydrated. NS 500 mL IV bolus Restart Lopressor 50 mg PO BID Home antihypertensive regimen: Lopressor 50 mg PO BID, hydralazine 50 mg PO TID, amlodipine 10 mg PO daily, furosemide 80 mg PO daily PRN  Cycle troponin.  12-lead EKG without sinister changes.   RENAL ESRD on HD, still making urine HD per Nephrology   GASTROINTESTINAL GI PROPHYLAXIS: Protonix   HEMATOLOGIC Macrocytic anemia Check B12 and folate levels DVT PROPHYLAXIS: heparin   INFECTIOUS SIRS, by criteria Given RLL consolidation, will empirically start Zosyn/linezolid to provide coverage for aspiration and healthcare-associated pneumonia (recent hospitalization 05/30/2021). Check procalcitonin Blood cultures,  sputum cuture, urinlaysis   ENDOCRINE: No acute issues   NEUROLOGIC Delirium, appears to have resolved Monitor neuro status per ICU protocol   PLAN/RECOMMENDATIONS  Admit to ICU under my service (Attending: Renee Pain, MD) with the diagnoses highlighted above in the active Hospital Problem List (ASSESSMENT). IV fluids: NS bolus 500 mL See above NUTRITION: cardiac    My assessment, plan of care, findings, medications, side effects, etc. were discussed with: patient (answered all questions to patient's satisfaction) and Dr Wyvonnia Dusky (ER) .   Best practice:  Diet: cardiac Pain/Anxiety/Delirium protocol (if indicated): N/A VAP protocol (if indicated): N/A DVT prophylaxis: heparin GI prophylaxis: Protonix Glucose control: N/A Mobility/Activity: bedrest   Code Status: Full Code Family Communication:   no family at bedside Disposition: admit to ICU   Labs   CBC: Recent Labs  Lab 06/29/21 2237  WBC 4.4   NEUTROABS 3.6  HGB 9.8*  HCT 30.5*  MCV 104.1*  PLT 350    Basic Metabolic Panel: Recent Labs  Lab 06/29/21 2237  NA 137  K 5.5*  CL 95*  CO2 27  GLUCOSE 107*  BUN 55*  CREATININE 6.71*  CALCIUM 9.3   GFR: CrCl cannot be calculated (Unknown ideal weight.). Recent Labs  Lab 06/29/21 2237  WBC 4.4    Liver Function Tests: Recent Labs  Lab 06/29/21 2237  AST 61*  ALT 6  ALKPHOS 80  BILITOT 1.4*  PROT 7.4  ALBUMIN 3.3*   No results for input(s): LIPASE, AMYLASE in the last 168 hours. No results for input(s): AMMONIA in the last 168 hours.  ABG    Component Value Date/Time   PHART 7.489 (H) 06/29/2021 2350   PCO2ART 34.3 06/29/2021 2350   PO2ART 90.4 06/29/2021 2350   HCO3 25.8 06/29/2021 2350   TCO2 23 04/04/2021 0640   ACIDBASEDEF 1.0 02/18/2021 0449   O2SAT 97.2 06/29/2021 2350     Coagulation Profile: No results for input(s): INR, PROTIME in the last 168 hours.  Cardiac Enzymes: No results for input(s): CKTOTAL, CKMB, CKMBINDEX, TROPONINI in the last 168 hours.  HbA1C: Hgb A1c MFr Bld  Date/Time Value Ref Range Status  01/04/2020 05:49 PM 4.9 4.8 - 5.6 % Final    Comment:    (NOTE) Pre diabetes:          5.7%-6.4% Diabetes:              >6.4% Glycemic control for   <7.0% adults with diabetes   02/17/2018 03:45 AM 5.1 4.8 - 5.6 % Final    Comment:    (NOTE) Pre diabetes:          5.7%-6.4% Diabetes:              >6.4% Glycemic control for   <7.0% adults with diabetes     CBG: No results for input(s): GLUCAP in the last 168 hours.   Past Medical History   Past Medical History:  Diagnosis Date   Anaphylactic shock, unspecified, sequela 06/10/2019   Aortic stenosis    mild-moderate AS 12/2020 echo   Arthritis    Asthma, chronic, unspecified asthma severity, with acute exacerbation 10/23/2017   CAP (community acquired pneumonia) 10/23/2017   Carpal tunnel syndrome of right wrist 10/05/2018   Cataract    right - removed by  surgery   Cerebral infarction due to thrombosis of cerebral artery (HCC)    Cervical disc herniation 01/04/2020   Chronic diastolic heart failure ([place]) 04/13/2018   Chronic low back pain 11/24/2019  Constipation    Cough    chronic cough   Dyspnea    Encounter for immunization 07/08/2017   ESRD on hemodialysis ([place]) 02/16/2018   Fall 06/04/2019   GERD (gastroesophageal reflux disease) 06/04/2019   GI bleeding 05/24/2017   Gram-negative sepsis, unspecified ([place]) 09/05/2017   History of fusion of cervical spine 03/26/2020   Hyperlipidemia    Hypertension    Hypokalemia 06/04/2017   Iron deficiency anemia, unspecified 09/09/2017   Left shoulder pain 11/11/2019   Lumbar radiculopathy 11/24/2019   Lung contusion 06/04/2019   LVH (left ventricular hypertrophy) due to hypertensive disease, with heart failure ([place]) 06/18/2020   Macrocytic anemia 05/24/2017   Moderate protein-calorie malnutrition ([place]) 06/06/2017   Myofascial pain syndrome 06/12/2020   Neuritis of right ulnar nerve 09/13/2018   Non-compliance with renal dialysis ([place]) 02/08/2020   Oxygen deficiency 12/30/2019   O2 sats on RA 87% at PAT appt    Pain in joint of right elbow 09/13/2018   Pulmonary hypertension ([place])    Renovascular hypertension 06/22/2020   Restless leg syndrome    Rib fractures 06/2019   Right   S/P cardiac cath 08/27/2020   normal coronary arteries.   Secondary hyperparathyroidism of renal origin ([place]) 06/04/2017   Thoracic ascending aortic aneurysm    4.5 cm 06/04/19 CT   Ulnar neuropathy 10/05/2018   Volume overload 10/23/2017      Surgical History    Past Surgical History:  Procedure Laterality Date   A/V FISTULAGRAM N/A 01/01/2021   Procedure: A/V FISTULAGRAM;  Surgeon: Serafina Mitchell, MD;  Location: [place] [facility];  Service: Cardiovascular;  Laterality: N/A;   A/V FISTULAGRAM N/A 01/15/2021   Procedure: A/V [id number];  Surgeon: Serafina Mitchell, MD;  Location: [place] [facility];   Service: Cardiovascular;  Laterality: N/A;   ANTERIOR CERVICAL DECOMP/DISCECTOMY FUSION N/A 01/04/2020   Procedure: ANTERIOR CERVICAL DECOMPRESSION/DISCECTOMY FUSION CERVICAL FIVE THROUGH SEVEN;  Surgeon: Melina Schools, MD;  Location: [place];  Service: Orthopedics;  Laterality: N/A;  3 hrs   AV FISTULA PLACEMENT Left 05/28/2017   Procedure: LEFT ARM ARTERIOVENOUS (AV) FISTULA CREATION;  Surgeon: Conrad Walton, MD;  Location: [place];  Service: Vascular;  Laterality: Left;   AV FISTULA PLACEMENT Left 02/02/2021   Procedure: Debride left forearm, ligation of left upper arm Aretiovenous fistula;  Surgeon: Elam Dutch, MD;  Location: [facility] OR;  Service: Vascular;  Laterality: Left;   AV FISTULA PLACEMENT Right 04/04/2021   Procedure: RIGHT ARTERIOVENOUS (AV) FISTULA CREATION;  Surgeon: Serafina Mitchell, MD;  [facility];  Service: Vascular;  Laterality: Right;   EYE SURGERY Right 06/02/2019   Cataract removed   FRACTURE SURGERY     INSERTION OF DIALYSIS CATHETER Right 02/02/2021   Procedure: INSERTION OF Right Internal jugular TUNNELED  DIALYSIS CATHETER;  Surgeon: Elam Dutch, MD;  Location: [place];  Service: Vascular;  Laterality: Right;   IR DIALY SHUNT INTRO NEEDLE/INTRACATH INITIAL W/IMG LEFT Left 09/12/2020   IR FLUORO GUIDE CV LINE RIGHT  05/25/2017   IR US GUIDE VASC ACCESS LEFT  09/12/2020   IR US GUIDE VASC ACCESS RIGHT  05/25/2017   LIGATION OF ARTERIOVENOUS  FISTULA Left 01/10/2021   Procedure: LIGATION OF LEFT ARM RADIOCEPHALIC FISTULA;  Surgeon: Serafina Mitchell, MD;  Location: [place];  Service: Vascular;  Laterality: Left;   PERIPHERAL VASCULAR BALLOON ANGIOPLASTY Left 01/15/2021   Procedure: PERIPHERAL VASCULAR BALLOON ANGIOPLASTY;  Surgeon: Serafina Mitchell, MD;  Location: [facility]  INVASIVE CV LAB;  Service: Cardiovascular;  Laterality: Left;  AVF   REVISON OF ARTERIOVENOUS FISTULA Left 07/18/2019   Procedure: REVISION PLICATION OF RADIOCEPHALIC ARTERIOVENOUS FISTULA LEFT ARM;  Surgeon:  Angelia Mould, MD;  [facility];  Service: Vascular;  Laterality: Left;   REVISON OF ARTERIOVENOUS FISTULA Left 01/10/2021   Procedure: CONVERSION TO BRACHIOCEPHALIC ARTERIOVENOUS FISTULA;  Surgeon: Serafina Mitchell, MD;  [facility];  Service: Vascular;  Laterality: Left;   RINOPLASTY        Social History   Social History   Socioeconomic History   Marital status: Single    Spouse name: Not on file   Number of children: Not on file   Years of education: Not on file   Highest education level: Not on file  Occupational History   Not on file  Tobacco Use   Smoking status: Never   Smokeless tobacco: Never  Vaping Use   Vaping Use: Never used  Substance and Sexual Activity   Alcohol use: Not Currently    Alcohol/week: 0.0 standard drinks    Comment: has a drink once in a while- 12/30/19   Drug use: No   Sexual activity: Not Currently  Other Topics Concern   Not on file  Social History Narrative   Lives with a roommate in a one story home.  Has 1 child.  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  Education: high school.   Social Determinants of Health   Financial Resource Strain: Not on file  Food Insecurity: Not on file  Transportation Needs: Not on file  Physical Activity: Not on file  Stress: Not on file  Social Connections: Not on file      Family History    Family History  Problem Relation Age of Onset   Cancer Mother    family history includes Cancer in his mother.    Allergies Allergies  Allergen Reactions   Vancomycin Shortness Of Breath and Itching      Current Medications  Current Facility-Administered Medications:    albuterol (VENTOLIN HFA) 108 (90 Base) MCG/ACT inhaler 2 puff, 2 puff, Inhalation, Q2H PRN, Rancour, Stephen, MD   morphine 2 MG/ML injection 2 mg, 2 mg, Intravenous, Once, Petrucelli, Samantha R, PA-C   sodium chloride 0.9 % bolus 500 mL, 500 mL, Intravenous, Once, Petrucelli, Samantha R, PA-C  Current Outpatient  Medications:    acetaminophen (TYLENOL) 500 MG tablet, Take 500 mg by mouth every 6 (six) hours as needed for mild pain, moderate pain or headache., Disp: , Rfl:    albuterol (VENTOLIN HFA) 108 (90 Base) MCG/ACT inhaler, Inhale 2 puffs into the lungs 2 (two) times daily as needed for shortness of breath or wheezing., Disp: 1 each, Rfl: 1   amLODipine (NORVASC) 10 MG tablet, Take 1 tablet (10 mg total) by mouth daily., Disp: 30 tablet, Rfl: 1   AURYXIA 1 GM 210 MG(Fe) tablet, Take 420 mg by mouth 3 (three) times daily with meals., Disp: , Rfl:    BREO ELLIPTA 100-25 MCG/INH AEPB, Inhale 1 puff into the lungs daily., Disp: 30 each, Rfl: 1   cinacalcet (SENSIPAR) 60 MG tablet, Take 1 tablet (60 mg total) by mouth every evening., Disp: 30 tablet, Rfl: 1   DIPHENHYDRAMINE HCL PO, Take 25 mg by mouth as needed (allergies)., Disp: , Rfl:    furosemide (LASIX) 80 MG tablet, Take 1 tablet (80 mg total) by mouth daily as needed for fluid (Use only on non-HD days (Tues/Thur/Sat/Sun)).,  Disp: 30 tablet, Rfl: 1   hydrALAZINE (APRESOLINE) 25 MG tablet, Take 2 tablets (50 mg total) by mouth 3 (three) times daily., Disp: 90 tablet, Rfl: 1   metoprolol tartrate (LOPRESSOR) 50 MG tablet, Take 1 tablet (50 mg total) by mouth 2 (two) times daily., Disp: 60 tablet, Rfl: 1   multivitamin (RENA-VIT) TABS tablet, Take 1 tablet by mouth daily., Disp: , Rfl:    sevelamer carbonate (RENVELA) 800 MG tablet, Take 1,600 mg by mouth 3 (three) times daily., Disp: , Rfl:    Home Medications  Prior to Admission medications   Medication Sig Start Date End Date Taking? Authorizing Provider  acetaminophen (TYLENOL) 500 MG tablet Take 500 mg by mouth every 6 (six) hours as needed for mild pain, moderate pain or headache.   Yes [provider]  albuterol (VENTOLIN HFA) 108 (90 Base) MCG/ACT inhaler Inhale 2 puffs into the lungs 2 (two) times daily as needed for shortness of breath or wheezing. 06/01/21  Yes Oswald Hillock, MD   amLODipine (NORVASC) 10 MG tablet Take 1 tablet (10 mg total) by mouth daily. 06/01/21  Yes Lama, Marge Duncans, MD  AURYXIA 1 GM 210 MG(Fe) tablet Take 420 mg by mouth 3 (three) times daily with meals. 03/20/21  Yes [provider]  BREO ELLIPTA 100-25 MCG/INH AEPB Inhale 1 puff into the lungs daily. 06/01/21 07/31/21 Yes Oswald Hillock, MD  cinacalcet (SENSIPAR) 60 MG tablet Take 1 tablet (60 mg total) by mouth every evening. 06/01/21  Yes Oswald Hillock, MD  DIPHENHYDRAMINE HCL PO Take 25 mg by mouth as needed (allergies). 09/24/20 09/23/21 Yes [provider]  furosemide (LASIX) 80 MG tablet Take 1 tablet (80 mg total) by mouth daily as needed for fluid (Use only on non-HD days (Tues/Thur/Sat/Sun)). 06/01/21  Yes Oswald Hillock, MD  hydrALAZINE (APRESOLINE) 25 MG tablet Take 2 tablets (50 mg total) by mouth 3 (three) times daily. 06/01/21  Yes Oswald Hillock, MD  metoprolol tartrate (LOPRESSOR) 50 MG tablet Take 1 tablet (50 mg total) by mouth 2 (two) times daily. 06/01/21  Yes Oswald Hillock, MD  multivitamin (RENA-VIT) TABS tablet Take 1 tablet by mouth daily. 01/12/18  Yes [provider]  sevelamer carbonate (RENVELA) 800 MG tablet Take 1,600 mg by mouth 3 (three) times daily. 05/31/21  Yes [provider]     Critical care time: 60 minutes.  The treatment and management of the patient's condition was required based on the threat of imminent deterioration. This time reflects time spent by the physician evaluating, providing care and managing the critically ill patient's care. The time was spent at the immediate bedside (or on the same floor/unit and dedicated to this patient's care). Time involved in separately billable procedures is NOT included int he critical care time indicated above. Family meeting and update time may be included above if and only if the patient is unable/incompetent to participate in clinical interview and/or decision making, and the discussion was  necessary to determining treatment decisions.   Renee Pain, MD Board Certified by the ABIM, [place]

## 2021-06-30 NOTE — Progress Notes (Signed)
eLink Physician-Brief Progress Note Patient Name: Thomas Robinson DOB: [birth year] MRN: [id number]   Date of Service  06/30/2021  HPI/Events of Note  Patient admitted via ED with acute on chronic respiratory failure, pneumonia and pulmonary edema, in the context of ESRD - Dialysis Dependent. He is currently on BIPAP.  eICU Interventions  New Patient Evaluation.        Kerry Kass Kenzli Barritt 06/30/2021, 5:45 AM

## 2021-06-30 NOTE — Progress Notes (Signed)
PCCM interval note  Subjective: -Off BiPAP, tolerating 4 L/min nasal cannula this morning -Sinus tachycardia, hypertension -Still with some paroxysmal cough, clear mucus.  Intermittent chest discomfort, responded to morphine   Vitals:   06/30/21 0741 06/30/21 0742 06/30/21 0744 06/30/21 0810  BP:      Pulse:   (!) 133   Resp:   (!) 22   Temp:    97.7 F (36.5 C)  TempSrc:    Axillary  SpO2: 96% 93% 96%   Weight:      Chronically ill-appearing man, thin in no distress on 4 L/min.  Comfortable respiratory pattern, bilateral inspiratory crackles, right greater than left, decreased to both bases.  Oropharynx dry, clear.  No secretions, no stridor.  Right tunneled HD catheter in place.  Abdomen benign.  No edema.  Acute on chronic hypoxemic respiratory failure, principally due to right lower lobe pneumonia, small/moderate right effusion.  There are some mild bilateral groundglass infiltrates that could reflect pulmonary edema.  Question some component of narcotic withdrawal contributing to tachypnea, discomfort RLL hypodensity, question evolving lung abscess, consider possible malignancy (less likely) -Continue BiPAP as needed.  Currently off but suspect he will need intermittently -Pain control with morphine -Empiric linezolid and Zosyn as ordered -Follow culture data and chest x-ray -Will need right thoracentesis when stable to undergo -Pulmicort nebs, bronchodilators as ordered  Sinus tachycardia due to the above. Hypertension.  History of diastolic dysfunction.   -Will continue his home metoprolol.   -Restart his amlodipine and hydralazine.   -Overall he appears to be dehydrated although he is hypertensive.  We will hold his Lasix (which he takes on on HD days)  End-stage renal disease.  Still makes some urine but gets his HD on MWF -Will notify nephrology of his admission -Lasix held as above -Add back his home phosphate binders, MVI   Independent CC time 31  minutes   Baltazar Apo, MD, PhD 06/30/2021, 9:04 AM [facility] [phone] or if no answer before 7:00PM call [phone] For any issues after 7:00PM please call eLink [phone]

## 2021-06-30 NOTE — Progress Notes (Signed)
RT inquire about bipap. Pt states he is not going to wear it tonight because he doesn't wear at home either. He states he is on O2 and will be fine. Pt is resting well at this time, No resp stress noted, Bipap stand by if needed. RN aware.

## 2021-06-30 NOTE — Progress Notes (Signed)
Pt requesting to come off BIPAP at this time.  Pt placed on 4 LPM [place].  PA at bedside and aware.

## 2021-06-30 NOTE — Progress Notes (Signed)
Pharmacy Antibiotic Note  Thomas Robinson is a 60 y.o. male with a h/o ESRD admitted on 06/29/2021 with pneumonia.  Pharmacy has been consulted for zosyn dosing.  Plan: Zosyn 2.25 g q 8 hours  Linezolid 600 mg bid per MD  Will sign off and follow remotely     Temp (24hrs), Avg:99.1 F (37.3 C), Min:98.7 F (37.1 C), Max:99.4 F (37.4 C)  Recent Labs  Lab 06/29/21 2237 06/30/21 0250  WBC 4.4  --   CREATININE 6.71*  --   LATICACIDVEN  --  0.7    CrCl cannot be calculated (Unknown ideal weight.).    Allergies  Allergen Reactions   Vancomycin Shortness Of Breath and Itching    Thank you for allowing pharmacy to be a part of this patient's care.  Ulice Dash D 06/30/2021 4:23 AM

## 2021-06-30 NOTE — Progress Notes (Signed)
[facility] Progress Note Patient Name: Thomas Robinson DOB: June 04, 1961 MRN: [id number]   Date of Service  06/30/2021  HPI/Events of Note  Patient is anxious and asking for something to help him with the anxiety.  eICU Interventions  Xanax 0.25 mg po x 1 ordered.        Kerry Kass Mathan Darroch 06/30/2021, 10:49 PM

## 2021-07-01 ENCOUNTER — Inpatient Hospital Stay (HOSPITAL_COMMUNITY): Payer: Medicare Other

## 2021-07-01 ENCOUNTER — Other Ambulatory Visit (HOSPITAL_COMMUNITY): Payer: Medicare Other

## 2021-07-01 DIAGNOSIS — R569 Unspecified convulsions: Secondary | ICD-10-CM | POA: Diagnosis not present

## 2021-07-01 DIAGNOSIS — J9621 Acute and chronic respiratory failure with hypoxia: Secondary | ICD-10-CM | POA: Diagnosis not present

## 2021-07-01 DIAGNOSIS — R609 Edema, unspecified: Secondary | ICD-10-CM

## 2021-07-01 LAB — RESPIRATORY PANEL BY PCR

## 2021-07-01 LAB — CBC
HCT: 33.1 % — ABNORMAL LOW (ref 39.0–52.0)
Hemoglobin: 10.4 g/dL — ABNORMAL LOW (ref 13.0–17.0)
MCH: 32.8 pg (ref 26.0–34.0)
MCHC: 31.4 g/dL (ref 30.0–36.0)
MCV: 104.4 fL — ABNORMAL HIGH (ref 80.0–100.0)
Platelets: 130 10*3/uL — ABNORMAL LOW (ref 150–400)
RBC: 3.17 MIL/uL — ABNORMAL LOW (ref 4.22–5.81)
RDW: 18 % — ABNORMAL HIGH (ref 11.5–15.5)
WBC: 7.4 10*3/uL (ref 4.0–10.5)
nRBC: 0.3 % — ABNORMAL HIGH (ref 0.0–0.2)

## 2021-07-01 LAB — HEMOGLOBIN A1C
Hgb A1c MFr Bld: 4.6 % — ABNORMAL LOW (ref 4.8–5.6)
Mean Plasma Glucose: 85.32 mg/dL

## 2021-07-01 LAB — PROTIME-INR
INR: 1.6 — ABNORMAL HIGH (ref 0.8–1.2)
Prothrombin Time: 19.3 seconds — ABNORMAL HIGH (ref 11.4–15.2)

## 2021-07-01 LAB — LACTIC ACID, PLASMA: Lactic Acid, Venous: 2.6 mmol/L (ref 0.5–1.9)

## 2021-07-01 LAB — COMPREHENSIVE METABOLIC PANEL
ALT: 29 U/L (ref 0–44)
AST: 74 U/L — ABNORMAL HIGH (ref 15–41)
Albumin: 2.7 g/dL — ABNORMAL LOW (ref 3.5–5.0)
Alkaline Phosphatase: 73 U/L (ref 38–126)
Anion gap: 20 — ABNORMAL HIGH (ref 5–15)
BUN: 68 mg/dL — ABNORMAL HIGH (ref 8–23)
CO2: 23 mmol/L (ref 22–32)
Calcium: 8.9 mg/dL (ref 8.9–10.3)
Chloride: 94 mmol/L — ABNORMAL LOW (ref 98–111)
Creatinine, Ser: 9.22 mg/dL — ABNORMAL HIGH (ref 0.61–1.24)
GFR, Estimated: 6 mL/min — ABNORMAL LOW (ref >60)
Glucose, Bld: 88 mg/dL (ref 70–99)
Potassium: 5.2 mmol/L — ABNORMAL HIGH (ref 3.5–5.1)
Sodium: 137 mmol/L (ref 135–145)
Total Bilirubin: 1.2 mg/dL (ref 0.3–1.2)
Total Protein: 6.3 g/dL — ABNORMAL LOW (ref 6.5–8.1)

## 2021-07-01 LAB — POCT I-STAT 7, (LYTES, BLD GAS, ICA,H+H)
Acid-base deficit: 8 mmol/L — ABNORMAL HIGH (ref 0.0–2.0)
Bicarbonate: 18.1 mmol/L — ABNORMAL LOW (ref 20.0–28.0)
Calcium, Ion: 1.04 mmol/L — ABNORMAL LOW (ref 1.15–1.40)
HCT: 29 % — ABNORMAL LOW (ref 39.0–52.0)
Hemoglobin: 9.9 g/dL — ABNORMAL LOW (ref 13.0–17.0)
O2 Saturation: 97 %
Patient temperature: 98.1
Potassium: 5 mmol/L (ref 3.5–5.1)
Sodium: 131 mmol/L — ABNORMAL LOW (ref 135–145)
TCO2: 19 mmol/L — ABNORMAL LOW (ref 22–32)
pCO2 arterial: 36.9 mmHg (ref 32.0–48.0)
pH, Arterial: 7.296 — ABNORMAL LOW (ref 7.350–7.450)
pO2, Arterial: 100 mmHg (ref 83.0–108.0)

## 2021-07-01 LAB — LIPASE, BLOOD: Lipase: 44 U/L (ref 11–51)

## 2021-07-01 LAB — GLUCOSE, CAPILLARY
Glucose-Capillary: 87 mg/dL (ref 70–99)
Glucose-Capillary: 73 mg/dL (ref 70–99)
Glucose-Capillary: 165 mg/dL — ABNORMAL HIGH (ref 70–99)
Glucose-Capillary: 58 mg/dL — ABNORMAL LOW (ref 70–99)
Glucose-Capillary: 72 mg/dL (ref 70–99)
Glucose-Capillary: 62 mg/dL — ABNORMAL LOW (ref 70–99)
Glucose-Capillary: 147 mg/dL — ABNORMAL HIGH (ref 70–99)
Glucose-Capillary: 85 mg/dL (ref 70–99)
Glucose-Capillary: 70 mg/dL (ref 70–99)
Glucose-Capillary: 132 mg/dL — ABNORMAL HIGH (ref 70–99)
Glucose-Capillary: 66 mg/dL — ABNORMAL LOW (ref 70–99)

## 2021-07-01 LAB — AMYLASE: Amylase: 93 U/L (ref 28–100)

## 2021-07-01 LAB — PROCALCITONIN: Procalcitonin: 1.36 ng/mL

## 2021-07-01 LAB — TROPONIN I (HIGH SENSITIVITY): Troponin I (High Sensitivity): 144 ng/L (ref <18)

## 2021-07-01 MED ORDER — PRISMASOL BGK 4/2.5 32-4-2.5 MEQ/L REPLACEMENT SOLN
Status: DC
  Filled 2021-07-01 (×4): qty 5000

## 2021-07-01 MED ORDER — DEXTROSE 50 % IV SOLN
12.5000 g | INTRAVENOUS | Status: AC
  Administered 2021-07-01: 12.5 g via INTRAVENOUS

## 2021-07-01 MED ORDER — DEXTROSE 50 % IV SOLN: 12.5000 g | INTRAVENOUS | Status: AC

## 2021-07-01 MED ORDER — SODIUM CHLORIDE 0.9 % FOR CRRT: INTRAVENOUS_CENTRAL | Status: DC | PRN

## 2021-07-01 MED ORDER — CALCIUM GLUCONATE-NACL 2-0.675 GM/100ML-% IV SOLN
2.0000 g | Freq: Once | INTRAVENOUS | Status: AC
  Administered 2021-07-02: 2000 mg via INTRAVENOUS
  Filled 2021-07-01: qty 100

## 2021-07-01 MED ORDER — SODIUM CHLORIDE 0.9 % IV SOLN: 250.0000 mL | INTRAVENOUS | Status: DC

## 2021-07-01 MED ORDER — OCTREOTIDE ACETATE 50 MCG/ML IJ SOLN
50.0000 ug | Freq: Once | INTRAMUSCULAR | Status: AC
  Administered 2021-07-01: 50 ug via SUBCUTANEOUS
  Filled 2021-07-01: qty 1

## 2021-07-01 MED ORDER — HEPARIN SODIUM (PORCINE) 1000 UNIT/ML DIALYSIS
1000.0000 [IU] | INTRAMUSCULAR | Status: DC | PRN
  Administered 2021-07-02: 3200 [IU] via INTRAVENOUS_CENTRAL
  Filled 2021-07-01 (×3): qty 6

## 2021-07-01 MED ORDER — DEXTROSE 50 % IV SOLN: 1.0000 | Freq: Once | INTRAVENOUS | Status: AC

## 2021-07-01 MED ORDER — SODIUM CHLORIDE 0.9 % IV SOLN
250.0000 [IU]/h | INTRAVENOUS | Status: DC
  Administered 2021-07-01: 250 [IU]/h via INTRAVENOUS_CENTRAL
  Filled 2021-07-01: qty 2

## 2021-07-01 MED ORDER — PRISMASOL BGK 4/2.5 32-4-2.5 MEQ/L EC SOLN
Status: DC
  Filled 2021-07-01 (×11): qty 5000

## 2021-07-01 MED ORDER — PHENYLEPHRINE HCL-NACL 20-0.9 MG/250ML-% IV SOLN
25.0000 ug/min | INTRAVENOUS | Status: DC
  Administered 2021-07-01: 35 ug/min via INTRAVENOUS
  Administered 2021-07-01 – 2021-07-02 (×2): 25 ug/min via INTRAVENOUS
  Filled 2021-07-01 (×4): qty 250

## 2021-07-01 MED ORDER — DEXTROSE 50 % IV SOLN
INTRAVENOUS | Status: AC
  Administered 2021-07-01: 50 mL via INTRAVENOUS
  Filled 2021-07-01: qty 50

## 2021-07-01 MED ORDER — ALTEPLASE 2 MG IJ SOLR
2.0000 mg | Freq: Once | INTRAMUSCULAR | Status: DC | PRN
  Filled 2021-07-01: qty 2

## 2021-07-01 MED ORDER — FENTANYL CITRATE (PF) 100 MCG/2ML IJ SOLN: 12.5000 ug | INTRAMUSCULAR | Status: DC | PRN

## 2021-07-01 MED ORDER — SODIUM CHLORIDE 0.9 % IV SOLN
8.0000 mg | Freq: Three times a day (TID) | INTRAVENOUS | Status: DC | PRN
  Administered 2021-07-02 (×2): 8 mg via INTRAVENOUS
  Filled 2021-07-01 (×4): qty 4

## 2021-07-01 MED ORDER — DEXTROSE 50 % IV SOLN
INTRAVENOUS | Status: AC
  Filled 2021-07-01: qty 50

## 2021-07-01 MED ORDER — SODIUM CHLORIDE 0.9 % IV BOLUS
500.0000 mL | Freq: Once | INTRAVENOUS | Status: DC
  Administered 2021-07-01: 500 mL via INTRAVENOUS

## 2021-07-01 MED ORDER — DEXTROSE 50 % IV SOLN
INTRAVENOUS | Status: AC
  Administered 2021-07-01: 12.5 g via INTRAVENOUS
  Filled 2021-07-01: qty 50

## 2021-07-01 MED ORDER — CHLORHEXIDINE GLUCONATE CLOTH 2 % EX PADS: 6.0000 | MEDICATED_PAD | Freq: Every day | CUTANEOUS | Status: DC

## 2021-07-01 MED ORDER — INSULIN ASPART 100 UNIT/ML IJ SOLN: 0.0000 [IU] | INTRAMUSCULAR | Status: DC

## 2021-07-01 MED ORDER — PIPERACILLIN-TAZOBACTAM 3.375 G IVPB 30 MIN
3.3750 g | Freq: Four times a day (QID) | INTRAVENOUS | Status: DC
  Administered 2021-07-01 – 2021-07-02 (×4): 3.375 g via INTRAVENOUS
  Filled 2021-07-01 (×6): qty 50

## 2021-07-01 MED ORDER — DEXTROSE 10 % IV SOLN: INTRAVENOUS | Status: AC

## 2021-07-01 MED ORDER — SODIUM CHLORIDE 0.9 % IV BOLUS
500.0000 mL | Freq: Once | INTRAVENOUS | Status: AC
  Administered 2021-07-01: 500 mL via INTRAVENOUS

## 2021-07-01 NOTE — H&P (Signed)
NAME:  Thomas Robinson MRN:  [id number] DOB:  1961/08/04 LOS: 1 ADMISSION DATE:  06/29/2021 DATE OF SERVICE:  06/30/2021  CHIEF COMPLAINT:  weakness, hypoxia     BRIEF  This 60 y.o. Caucasian male nonsmoker presented to the [facility] Emergency Department via EMS with complaints of generalized weakness. Per EMS report, they found the patient off his oxygen with SpO2 72%.  He states that he has felt weak for several days (approximately one week), during which time he continued to present for hemodialysis.  He states that he was experiencing generalized weakness and severe exertional dyspnea that was associated with chest discomfort and tightness along with abdominal discomfort.  At present, he only endorses fatigue, weakness and dyspnea.  He also states that he was having coughing spells that were largely nonproductive and when productive only nonpurulent secretions were noted.  He also complains of generalized pain "all over" which he states has been a problem since he ran out of "pain meds" about a week ago, which were apparently prescribed to him after back surgery in August 2022.  He also adds that his girlfriend has been telling him that he has had episodes of delirium while at home.  In the ER, the patient has been supported with supplemental oxygen.  He uses 2.5 LPM at home (unknown diagnosis).  He was increased to 5 LPM in the ER and remained hypoxic, so BiPAP was initiated.  At the time of clinical interview, the patient is off BiPAP and back on 5 LPM.  SpO2 94-95% but RR 30.  HR 130.  Chest CTA was obtained in the ER which showed no pulmonary embolism but did identify (moderate right, trace left) pleural effusions; diffuse ground-glass opacities and wmooth interlobular septal thickening; RLL consolidation with a hypodensity measuring 4x5 cm; small pericardial effusion.  Past Medical/Surgical/Social/Family History   has a past medical history of Anaphylactic shock, unspecified, sequela  (06/10/2019), Aortic stenosis, Arthritis, Asthma, chronic, unspecified asthma severity, with acute exacerbation (10/23/2017), CAP (community acquired pneumonia) (10/23/2017), Carpal tunnel syndrome of right wrist (10/05/2018), Cataract, Cerebral infarction due to thrombosis of cerebral artery ([place]), Cervical disc herniation (01/04/2020), Chronic diastolic heart failure ([place]) (04/13/2018), Chronic low back pain (11/24/2019), Constipation, Cough, Dyspnea, Encounter for immunization (07/08/2017), ESRD on hemodialysis ([place]) (02/16/2018), Fall (06/04/2019), GERD (gastroesophageal reflux disease) (06/04/2019), GI bleeding (05/24/2017), Gram-negative sepsis, unspecified ([place]) (09/05/2017), History of fusion of cervical spine (03/26/2020), Hyperlipidemia, Hypertension, Hypokalemia (06/04/2017), Iron deficiency anemia, unspecified (09/09/2017), Left shoulder pain (11/11/2019), Lumbar radiculopathy (11/24/2019), Lung contusion (06/04/2019), LVH (left ventricular hypertrophy) due to hypertensive disease, with heart failure ([place]) (06/18/2020), Macrocytic anemia (05/24/2017), Moderate protein-calorie malnutrition ([place]) (06/06/2017), Myofascial pain syndrome (06/12/2020), Neuritis of right ulnar nerve (09/13/2018), Non-compliance with renal dialysis ([place]) (02/08/2020), Oxygen deficiency (12/30/2019), Pain in joint of right elbow (09/13/2018), Pulmonary hypertension ([place]), Renovascular hypertension (06/22/2020), Restless leg syndrome, Rib fractures (06/2019), S/P cardiac cath (08/27/2020), Secondary hyperparathyroidism of renal origin ([place]) (06/04/2017), Thoracic ascending aortic aneurysm, Ulnar neuropathy (10/05/2018), and Volume overload (10/23/2017).   has a past surgical history that includes RINOPLASTY; IR Fluoro Guide CV Line Right (05/25/2017); IR US Guide Vasc Access Right (05/25/2017); AV fistula placement (Left, 05/28/2017); Eye surgery (Right, 06/02/2019); Fracture surgery; Revison of arteriovenous fistula (Left,  07/18/2019); Anterior cervical decomp/discectomy fusion (N/A, 01/04/2020); IR DIALY SHUNT INTRO NEEDLE/INTRACATH INITIAL W/IMG LEFT (Left, 09/12/2020); IR US Guide Vasc Access Left (09/12/2020); A/V Fistulagram (N/A, 01/01/2021); Ligation of arteriovenous  fistula (Left, 01/10/2021); Revison of arteriovenous fistula (Left, 01/10/2021); A/V Fistulagram (N/A, 01/15/2021);  PERIPHERAL VASCULAR BALLOON ANGIOPLASTY (Left, 01/15/2021); AV fistula placement (Left, 02/02/2021); Insertion of dialysis catheter (Right, 02/02/2021); and AV fistula placement (Right, 04/04/2021).   EVENTS   06/29/2021 - admit 07/01/21 -    Interim history/subjective:   07/01/2021  - reports ESRD on MWF HD. In summer 2022 he had injury and fractured LUE AVF . Now has RUE AVF that has matured. Had RT subclav portocath placed in summer 2022 that is being used per report.  Makes urine at [place]. Not had HD since admit. Renal Dr Jonnie Finner wants him transffered to [facility]  Also on 2L [place] at home but not on BiPAP/cPAP.  Per RN - reason for home O2 use not clar  Now on 10L [place].  Per Dr Jonnie Finner - known non compliance. High risk for volume overlaod  On neo gtt - BP on lef shin - per RN very inaccurate BP reasongs.   REports nausea/vomit and inability to eat last night and this morning  Objective   BP (!) 131/17   Pulse (!) 115   Temp 98.5 F (36.9 C) (Oral)   Resp 19   Wt 58.2 kg   SpO2 (!) 89%   BMI 19.51 kg/m     Filed Weights   06/30/21 0530 07/01/21 0433  Weight: 55.1 kg 58.2 kg    Intake/Output Summary (Last 24 hours) at 07/01/2021 0959 Last data filed at 07/01/2021 0700 Gross per 24 hour  Intake 1241.76 ml  Output 50 ml  Net 1191.76 ml       General Appearance:  Looks cachectic Head:  Normocephalic, without obvious abnormality, atraumatic Eyes:  PERRL - yes, conjunctiva/corneas - muddy     Ears:  Normal external ear canals, both ears Nose:  G tube - no but has [place] Throat:  ETT TUBE - no , OG tube - no Neck:   Supple,  No enlargement/tenderness/nodules Lungs: Clear to auscultation bilaterally, Heart:  S1 and S2 normal, no murmur, CVP - no.  Pressors - no Abdomen:  Soft, no masses, no organomegaly Genitalia / Rectal:  Not done Extremities:  Extremities- intact Skin:  ntact in exposed areas . Sacral area - not examined Neurologic:  Sedation - none -> RASS - +1 . Moves all 4s - yes. CAM-ICU - neg . Orientation - x3+     Resolved Hospital Problem list       Assessment & Plan:      A:  Chronic respiratory failure - 2L [place] use Prior to & Present on Admit  - unclear basis for hypoxemia (but suspected pulmonary hypretension and pleural effusions and ? COPD /asthma- on breo)  Acute hypoxemic respiratory failure - 10L [place] - at admit - due to R > L pleural effusion and RLL consolidation/atelectasis  07/01/2021 -> still on 10L [place]  P:   [place] o2 for pulse ox > 92% Check ABG    A:   Hx of intermittent delirium at home. Not on benzo/opiioids at home  -  Prior to & Present on Admit  10/31 - normal mental status. PRn morphine + on MAR  P:   Monitor Check UDS 07/01/21 Dc prn morphine     A:   Hx of hypertension - Prior to & Present on Admit = On Norvasc and lasix Circulatory shock - present on admission - ? Volume versus sepsis. On empiric antibiotics  10/31 - needing neo 161mcg but unclear true BP (being measured in RLE)  P:  MAP goal > 65 Place a-line  to get true measurement of BP Hold norvasc and lasix   A: ECho may 2022 - Gr1 DDx, Moderate AS, Mild Mitral regur, and severe PASP - Prior to & Present on Admit  10/31 - trop ok  P: Recheck trop Recheck echo Hold lasix   A: Sinus   P: tele   A:   Concern for RLL pneumonia - at admit (could be RLL atelectasis from effusion and/or Volume overload). - MRSA PCR neg, Flu PCR neg, covid pcr neg  10/31 - PCT suggests possible localized infection causing sepsis  P:   Check urine leg Check urine strep Check RVP Abx  as below .xxx Zyvox 10/30 >>10/31 Zosyn 10/30 >> Cefepime 10/30>>10/30    A:  ESRD = MWf HD - (has RT portocath since summer 2022 and RUE AV graft). On rena-vit, renvela, sensipar, auryxia at home  07/01/2021 - no HD/CRRT since admit  P:  Renal consult - Dr Jonnie Finner - requesting transfer to [facility] to decide on CRRT 07/01/2021 v HD 07/02/21; Renal will see Meds per renal consult Might need midodrine - TBD after a-line  A:  Hyperkalemia - mild at admit  10/31 - normal but at risk for derangmenet on lutes  P: Monitor and replete as appropriate    A:   Complaints of Nausea/Vomit since 06/30/21  10/31 - stil Nausea and Vomit +  P:   Zofran prn Check amylase, lipase, trop, lactate Check KUB   A:  Chronic anemia - Prior to & Present on Admit. Hgb 9.8 sept 2022  10/31 - last hgb yesterday  P:  - recheck cbc 07/01/21 - PRBC for hgb </= 6.9gm%    - exceptions are   -  if ACS susepcted/confirmed then transfuse for hgb </= 8.0gm%,  or    -  active bleeding with hemodynamic instability, then transfuse regardless of hemoglobin value   At at all times try to transfuse 1 unit prbc as possible with exception of active hemorrhage  A Chronic mild  mild thrombocytopenia -Prior to & Present on Admit. Plat 124-143 baseline  10/30 - plat at baseline  P Monitor SQ heparin to continiue (monitor for drop) Check Duplex LE  A:   No hx of DM but at risk for hyper- and hypo- glycemia  P:   ssi  MSK/DERM Cachexia. BMI 19.5 - Prior to & Present on Admit Severe Protein calorie malnutirtion (BMI 19.5 + alb 3) - Prior to & Present on Admit  Plan   - needs PT/mobiltizaion    Best practice:  Diet: cardiac Pain/Anxiety/Delirium protocol (if indicated): N/A VAP protocol (if indicated): N/A DVT prophylaxis: heparin GI prophylaxis: Protonix Glucose control: N/A Mobility/Activity: bedrest   Code Status: Full Code Family Communication:   no family at  bedside  - patient updated of above an dneed for transfer. He agrees Disposition: admit to ICU -move to [facility] - d/w Dr Jacky Kindle of [facility] at that campus 07/01/2021 . Also updated Court Joy (friend listed) [phone] - but her VM was full ( no pick up and could not leave message). Then called [phone] - kept on ringing ->then went dead    [place]  Lab 06/29/21 2350  PHART 7.489*  PCO2ART 34.3  PO2ART 90.4  HCO3 25.8  O2SAT 97.2    CBC Recent Labs  Lab 06/29/21 2237 06/30/21 0658  HGB 9.8* 8.7*  HCT 30.5* 27.4*  WBC 4.4 4.3  PLT 181 131*    COAGULATION Recent Labs  Lab 06/30/21 0258  INR 1.3*    CARDIAC  No results for input(s): TROPONINI in the last 168 hours. No results for input(s): PROBNP in the last 168 hours.   CHEMISTRY Recent Labs  Lab 06/29/21 2237 06/30/21 0658  NA 137 137  K 5.5* 4.2  CL 95* 98  CO2 27 24  GLUCOSE 107* 90  BUN 55* 61*  CREATININE 6.71* 7.63*  CALCIUM 9.3 9.2  MG  --  2.4   Estimated Creatinine Clearance: 8.3 mL/min (A) (by C-G formula based on SCr of 7.63 mg/dL (H)).   LIVER Recent Labs  Lab 06/29/21 2237 06/30/21 0258  AST 61*  --   ALT 6  --   ALKPHOS 80  --   BILITOT 1.4*  --   PROT 7.4  --   ALBUMIN 3.3*  --   INR  --  1.3*     INFECTIOUS Recent Labs  Lab 06/30/21 0250 06/30/21 0658 07/01/21 0255  LATICACIDVEN 0.7 0.8  --   PROCALCITON  --  0.96 1.36     ENDOCRINE CBG (last 3)  Recent Labs    07/01/21 0421  GLUCAP 87         IMAGING x48h  - image(s) personally visualized  -   highlighted in bold DG Chest 2 View  Result Date: 06/29/2021 CLINICAL DATA:  Shortness of breath.  Left-sided chest pain.  Cough EXAM: CHEST - 2 VIEW COMPARISON:  Radiograph 05/31/2019 FINDINGS: Right-sided dialysis catheter remains in place. Cardiomegaly again seen. Increase in bilateral pleural effusions, right greater than left. Increased fluid in the right minor  fissure. Is peribronchial and interstitial thickening likely represents pulmonary edema. There is no pneumothorax. IMPRESSION: 1. Pulmonary edema and pleural effusions, increased since prior exam. Findings suggest CHF. 2. Cardiomegaly is unchanged. Electronically Signed   By: Keith Rake M.D.   On: 06/29/2021 22:55   CT Angio Chest PE W/Cm &/Or Wo Cm  Result Date: 06/30/2021 CLINICAL DATA:  Chest pain or SOB, pleurisy or effusion suspected. Chest pain, dyspnea, cough. Hypoxia. EXAM: CT ANGIOGRAPHY CHEST WITH CONTRAST TECHNIQUE: Multidetector CT imaging of the chest was performed using the standard protocol during bolus administration of intravenous contrast. Multiplanar CT image reconstructions and MIPs were obtained to evaluate the vascular anatomy. CONTRAST:  58mL OMNIPAQUE IOHEXOL 350 MG/ML SOLN COMPARISON:  None. FINDINGS: Cardiovascular: Adequate opacification of the pulmonary arterial tree. No intraluminal filling defect identified to suggest acute pulmonary embolism. Central pulmonary arteries are of normal caliber. Extensive multi-vessel coronary artery calcification. Mild global cardiomegaly with left ventricular hypertrophy noted. Small pericardial effusion present. Mild atherosclerotic calcification within the thoracic aorta. The thoracic aorta is dilated in its ascending segment measuring 4.2 cm in greatest dimension. The descending thoracic aorta is of normal caliber. Right internal jugular hemodialysis catheter tip noted within the superior cavoatrial junction. Mediastinum/Nodes: Visualized thyroid is unremarkable. No pathologic thoracic adenopathy. Esophagus is unremarkable. Lungs/Pleura: Moderate right and trace left pleural effusions are present. There is diffuse ground-glass pulmonary infiltrate and smooth interlobular septal thickening in keeping with probable diffuse alveolar and pulmonary interstitial edema. Widespread pneumonic or inflammatory infiltrate is possible but considered  less likely. There is, however, collapse and consolidation of the right lower lobe. There is a superimposed rounded region of hypoenhancement within the a right lower lobe, best seen on image # 112/4, suspicious for a a region of parenchymal necrosis in the setting of necrotizing pneumonia or, less likely, a  acute to subacute pulmonary infarct. This measures 4.0 x 5.0 cm on axial image # 112/4. no pneumothorax. No central obstructing lesion peer Upper Abdomen: Marked atrophy of the visualized left kidney in keeping with changes of chronic renal insufficiency. No acute abnormality. Musculoskeletal: No acute bone abnormality. No lytic or blastic bone lesion. Review of the MIP images confirms the above findings. IMPRESSION: Collapse and consolidation of the right lower lobe in keeping with changes of lobar pneumonia, with superimposed area of hypoenhancement suspicious for an area of parenchymal necrosis. Less likely, this may represent a acute to subacute infarct. No pulmonary embolism, however, is identified. Superimposed diffuse ground-glass pulmonary infiltrate most suggestive of diffuse alveolar pulmonary edema. Moderate right pleural effusion is nonspecific and may relate to volume overload or represent a parapneumonic effusion. Mild cardiomegaly with left ventricular hypertrophy. Extensive coronary artery calcification. Small pericardial effusion. Dilation of the ascending aorta with maximal dimension of 4.2 cm. Recommend annual imaging followup by CTA or MRA. This recommendation follows 2010 ACCF/AHA/AATS/ACR/ASA/SCA/SCAI/SIR/STS/SVM Guidelines for the Diagnosis and Management of Patients with Thoracic Aortic Disease. Circulation. 2010; 121: M034-J179. Aortic aneurysm NOS (ICD10-I71.9) Electronically Signed   By: Fidela Salisbury M.D.   On: 06/30/2021 01:36

## 2021-07-01 NOTE — Significant Event (Signed)
[facility] COVERAGE PROGRESS NOTE  CTSP re: recurrent hypoglycemia despite D10 infusion and suspected seizure activity.  EEG report from earlier this evening reviewed, notable for absence of suspected seizure activity.  At the time of clinical encounter, the patient is on CRRT and phenylephrine infusion.  He reportedly has had multiple short (<1 min) episodes of left eye deviation, associated with transient unresponsiveness and hypotension (which prompted the phenylephrine infusion).  He is awake, alert and oriented.  Moves all 4 extremities.  Case discussed with nurse.  Most recent labs reviewed.  K 5.0, Ca++ 1.04.  Agree with D10 infusion Calcium gluconate 2 g IV push now. Continue to monitor for seizure-like activity. May need to consider Ceribell or continuous EEG.  Renee Pain, MD Board Certified by the ABIM, [place]

## 2021-07-01 NOTE — Progress Notes (Signed)
Patient was transferred from [facility] for possible CRRT considering he is in renal failure with shock requiring vasopressor support.  Patient was noted to seizure-like activity, he was found unresponsive after generalized tonic-clonic shaking.  Seizure activity stopped spontaneously after 1 minute Post seizure patient was noted to be confused, hypoxic He was placed on high flow nasal cannula oxygen and nonrebreather with improvement in O2 sats By blood pressure cuff patient was noted to have SBP in the 80s, he is on phenylephrine Fingerstick was checked, it was 58, patient received 1 ampoule of D50 We will start him on D10 infusion at 30 cc/h He received 500 cc of normal saline bolus.  We will hold off on further aggressive IV fluid therapy Continue to monitor fingerstick every 2 hour for now Nephrology is following for hemodialysis need    Additional critical care time: 34 minutes  Performed by: [place] care time was exclusive of separately billable procedures and treating other patients.   Critical care was necessary to treat or prevent imminent or life-threatening deterioration.   Critical care was time spent personally by me on the following activities: development of treatment plan with patient and/or surrogate as well as nursing, discussions with consultants, evaluation of patient's response to treatment, examination of patient, obtaining history from patient or surrogate, ordering and performing treatments and interventions, ordering and review of laboratory studies, ordering and review of radiographic studies, pulse oximetry and re-evaluation of patient's condition.   Jacky Kindle MD [place] [facility] See Amion for pager If no response to pager, please call [phone] until 7pm After 7pm, Please call E-link [phone]

## 2021-07-01 NOTE — Progress Notes (Signed)
Patient had 3 episodes of N/V pt had body tremors eyes rolled back in head unable to speak. Unable to obtain a B/P. Dr Tacy Learn notified and at bedside Glucose checked ,58, treated with dextrose. O2 sats dropped placed on non rebreather. RT in to place art line. Patient had 1 IV. HD catheter accessed venous port withdrew 10cc prior to flushing. Nacl bolus given as per order.  Patient now talking and states that he feels much better.

## 2021-07-01 NOTE — Progress Notes (Signed)
Stat  EEG complete - results pending.  

## 2021-07-01 NOTE — Progress Notes (Addendum)
eLink Physician-Brief Progress Note Patient Name: Thomas Robinson DOB: [birth year] MRN: [id number]   Date of Service  07/01/2021  HPI/Events of Note  Notified of an event where patient had become unresponsive, had some left eye deviation and had 'stiffness' of his body but with no tonic clonic activity. This went on for about 90 seconds per RN. When I was called, I saw him on camera and he was fully awake and alert with no focal signs. He has had such events during the day as well and is planned for an EEG and CRRT. Glucose checked now was 60 and RN was going to treat this. He is on d10 at 30 cc/hour, Phenylephrine at 25 mic/min and planned for CRRT  eICU Interventions  Increase d10 to 50 cc/hour Check hourly glucose for now Will also check a CT head since I do not see any head imaging done, no history of seizure documented       Intervention Category Major Interventions: Seizures - evaluation and management  Margaretmary Lombard 07/01/2021, 7:29 PM  Addendum 11:05 pm  Called for repeat similar episode  Very brief and by the time I got there, he is awake and alert  Does not recollect the event Stat EEG was negative CT head negative Glucose still low while on D10 - going to double the rate now that we will be starting CRRT Have also notified ground team to assess - I am not able to find anything focal or different on exam on camera

## 2021-07-01 NOTE — Progress Notes (Signed)
PHARMACY NOTE:  ANTIMICROBIAL RENAL DOSAGE ADJUSTMENT  Current antimicrobial regimen includes a mismatch between antimicrobial dosage and estimated renal function.  As per policy approved by the Pharmacy & Therapeutics and Medical Executive Committees, the antimicrobial dosage will be adjusted accordingly.  Current antimicrobial dosage:  Zosyn 2.25 g IV every 8 hours  Indication: PNA  Renal Function:  Estimated Creatinine Clearance: 7 mL/min (A) (by C-G formula based on SCr of 9.22 mg/dL (H)). []      On intermittent HD, scheduled: [x]      On CRRT    Antimicrobial dosage has been changed to:  Zosyn 3.375g IV every 6 hours  Additional comments:   Thank you for allowing pharmacy to be a part of this patient's care.  Antonietta Jewel, PharmD, [place] Clinical Pharmacist  Phone: [phone] 07/01/2021 5:13 PM  Please check AMION for all [place] phone numbers After 10:00 PM, call [place] [phone]

## 2021-07-01 NOTE — Progress Notes (Signed)
Bilateral lower extremity venous duplex has been completed. Preliminary results can be found in CV Proc through chart review.   07/01/21 11:38 AM Thomas Robinson RVT

## 2021-07-01 NOTE — Consult Note (Addendum)
Renal Service Consult Note [facility]  Thomas Robinson 07/01/2021 Thomas Blazing, MD Requesting Physician: Dr. Chase Robinson  Reason for Consult: ESRD pt w/ Pna, septic shock HPI: The patient is a 60 y.o. year-old w/ hx of CAP, CVA, chronic pain, ESRD on HD, HTN, HL, pHTN, RLS presented to ED 06/29/21 w/ c/o SOB and chest pain, cough. SpO2 was low 72%. Had HD 10/28. In ED workup showed CTA of chest w/ no PE but small -moderate bilat pleural effusions, diffuse GG opacities, RLL consolidation. Pt had SIRS. Pt was admitted and given bipap / [place] support, mso4 IV, IV abx (zosyn, zyvox), and nebs. He got fluids for dehydration. We are asked to see for dialysis.   Pt w/o SOB, cough today, on nasal cannula O2. Seen in ICU at [facility]. No abd pain. Asking for "morphine".   ROS - denies CP, no joint pain, no HA, no blurry vision, no rash, no diarrhea, no nausea/ vomiting   Past Medical History  Past Medical History:  Diagnosis Date   Anaphylactic shock, unspecified, sequela 06/10/2019   Aortic stenosis    mild-moderate AS 12/2020 echo   Arthritis    Asthma, chronic, unspecified asthma severity, with acute exacerbation 10/23/2017   CAP (community acquired pneumonia) 10/23/2017   Carpal tunnel syndrome of right wrist 10/05/2018   Cataract    right - removed by surgery   Cerebral infarction due to thrombosis of cerebral artery (HCC)    Cervical disc herniation 01/04/2020   Chronic diastolic heart failure ([place]) 04/13/2018   Chronic low back pain 11/24/2019   Constipation    Cough    chronic cough   Dyspnea    Encounter for immunization 07/08/2017   ESRD on hemodialysis ([place]) 02/16/2018   Fall 06/04/2019   GERD (gastroesophageal reflux disease) 06/04/2019   GI bleeding 05/24/2017   Gram-negative sepsis, unspecified ([place]) 09/05/2017   History of fusion of cervical spine 03/26/2020   Hyperlipidemia    Hypertension    Hypokalemia 06/04/2017   Iron deficiency anemia, unspecified  09/09/2017   Left shoulder pain 11/11/2019   Lumbar radiculopathy 11/24/2019   Lung contusion 06/04/2019   LVH (left ventricular hypertrophy) due to hypertensive disease, with heart failure ([place]) 06/18/2020   Macrocytic anemia 05/24/2017   Moderate protein-calorie malnutrition ([place]) 06/06/2017   Myofascial pain syndrome 06/12/2020   Neuritis of right ulnar nerve 09/13/2018   Non-compliance with renal dialysis ([place]) 02/08/2020   Oxygen deficiency 12/30/2019   O2 sats on RA 87% at PAT appt    Pain in joint of right elbow 09/13/2018   Pulmonary hypertension ([place])    Renovascular hypertension 06/22/2020   Restless leg syndrome    Rib fractures 06/2019   Right   S/P cardiac cath 08/27/2020   normal coronary arteries.   Secondary hyperparathyroidism of renal origin ([place]) 06/04/2017   Thoracic ascending aortic aneurysm    4.5 cm 06/04/19 CT   Ulnar neuropathy 10/05/2018   Volume overload 10/23/2017   Past Surgical History  Past Surgical History:  Procedure Laterality Date   A/V FISTULAGRAM N/A 01/01/2021   Procedure: A/V FISTULAGRAM;  Surgeon: Serafina Mitchell, MD;  Location: [place] CV LAB;  Service: Cardiovascular;  Laterality: N/A;   A/V FISTULAGRAM N/A 01/15/2021   Procedure: A/V [id number];  Surgeon: Serafina Mitchell, MD;  Location: [place] CV LAB;  Service: Cardiovascular;  Laterality: N/A;   ANTERIOR CERVICAL DECOMP/DISCECTOMY FUSION N/A 01/04/2020   Procedure: ANTERIOR CERVICAL DECOMPRESSION/DISCECTOMY FUSION CERVICAL FIVE THROUGH SEVEN;  Surgeon: Melina Schools, MD;  Location: [place];  Service: Orthopedics;  Laterality: N/A;  3 hrs   AV FISTULA PLACEMENT Left 05/28/2017   Procedure: LEFT ARM ARTERIOVENOUS (AV) FISTULA CREATION;  Surgeon: Conrad [place], MD;  Location: [place];  Service: Vascular;  Laterality: Left;   AV FISTULA PLACEMENT Left 02/02/2021   Procedure: Debride left forearm, ligation of left upper arm Aretiovenous fistula;  Surgeon: Elam Dutch, MD;  Location:  [facility] OR;  Service: Vascular;  Laterality: Left;   AV FISTULA PLACEMENT Right 04/04/2021   Procedure: RIGHT ARTERIOVENOUS (AV) FISTULA CREATION;  Surgeon: Serafina Mitchell, MD;  Location: [facility] OR;  Service: Vascular;  Laterality: Right;   EYE SURGERY Right 06/02/2019   Cataract removed   FRACTURE SURGERY     INSERTION OF DIALYSIS CATHETER Right 02/02/2021   Procedure: INSERTION OF Right Internal jugular TUNNELED  DIALYSIS CATHETER;  Surgeon: Elam Dutch, MD;  Location: [place];  Service: Vascular;  Laterality: Right;   IR DIALY SHUNT INTRO NEEDLE/INTRACATH INITIAL W/IMG LEFT Left 09/12/2020   IR FLUORO GUIDE CV LINE RIGHT  05/25/2017   IR US GUIDE VASC ACCESS LEFT  09/12/2020   IR US GUIDE VASC ACCESS RIGHT  05/25/2017   LIGATION OF ARTERIOVENOUS  FISTULA Left 01/10/2021   Procedure: LIGATION OF LEFT ARM RADIOCEPHALIC FISTULA;  Surgeon: Serafina Mitchell, MD;  Location: [place];  Service: Vascular;  Laterality: Left;   PERIPHERAL VASCULAR BALLOON ANGIOPLASTY Left 01/15/2021   Procedure: PERIPHERAL VASCULAR BALLOON ANGIOPLASTY;  Surgeon: Serafina Mitchell, MD;  Location: [place] CV LAB;  Service: Cardiovascular;  Laterality: Left;  AVF   REVISON OF ARTERIOVENOUS FISTULA Left 07/18/2019   Procedure: REVISION PLICATION OF RADIOCEPHALIC ARTERIOVENOUS FISTULA LEFT ARM;  Surgeon: Angelia Mould, MD;  Location: [facility] OR;  Service: Vascular;  Laterality: Left;   REVISON OF ARTERIOVENOUS FISTULA Left 01/10/2021   Procedure: CONVERSION TO BRACHIOCEPHALIC ARTERIOVENOUS FISTULA;  Surgeon: Serafina Mitchell, MD;  Location: [facility] OR;  Service: Vascular;  Laterality: Left;   RINOPLASTY     Family History  Family History  Problem Relation Age of Onset   Cancer Mother    Social History  reports that he has never smoked. He has never used smokeless tobacco. He reports that he does not currently use alcohol. He reports that he does not use drugs. Allergies  Allergies  Allergen Reactions   Vancomycin Shortness Of  Breath and Itching   Home medications Prior to Admission medications   Medication Sig Start Date End Date Taking? Authorizing Provider  acetaminophen (TYLENOL) 500 MG tablet Take 500 mg by mouth every 6 (six) hours as needed for mild pain, moderate pain or headache.   Yes [provider]  albuterol (VENTOLIN HFA) 108 (90 Base) MCG/ACT inhaler Inhale 2 puffs into the lungs 2 (two) times daily as needed for shortness of breath or wheezing. 06/01/21  Yes Oswald Hillock, MD  amLODipine (NORVASC) 10 MG tablet Take 1 tablet (10 mg total) by mouth daily. 06/01/21  Yes Lama, Marge Duncans, MD  AURYXIA 1 GM 210 MG(Fe) tablet Take 420 mg by mouth 3 (three) times daily with meals. 03/20/21  Yes [provider]  BREO ELLIPTA 100-25 MCG/INH AEPB Inhale 1 puff into the lungs daily. 06/01/21 07/31/21 Yes Oswald Hillock, MD  cinacalcet (SENSIPAR) 60 MG tablet Take 1 tablet (60 mg total) by mouth every evening. 06/01/21  Yes Oswald Hillock, MD  DIPHENHYDRAMINE HCL PO Take 25 mg by mouth  as needed (allergies). 09/24/20 09/23/21 Yes [provider]  furosemide (LASIX) 80 MG tablet Take 1 tablet (80 mg total) by mouth daily as needed for fluid (Use only on non-HD days (Tues/Thur/Sat/Sun)). 06/01/21  Yes Oswald Hillock, MD  hydrALAZINE (APRESOLINE) 25 MG tablet Take 2 tablets (50 mg total) by mouth 3 (three) times daily. 06/01/21  Yes Oswald Hillock, MD  metoprolol tartrate (LOPRESSOR) 50 MG tablet Take 1 tablet (50 mg total) by mouth 2 (two) times daily. 06/01/21  Yes Oswald Hillock, MD  multivitamin (RENA-VIT) TABS tablet Take 1 tablet by mouth daily. 01/12/18  Yes [provider]  sevelamer carbonate (RENVELA) 800 MG tablet Take 1,600 mg by mouth 3 (three) times daily. 05/31/21  Yes [provider]     Vitals:   07/01/21 1300 07/01/21 1400 07/01/21 1500 07/01/21 1600  BP:  (!) 68/14  (!) 110/24  Pulse:  (!) 114  (!) 111  Resp:  16 16 15   Temp:      TempSrc:      SpO2:  97%  91%   Weight: 59.2 kg      Exam Gen alert, no distress No rash, cyanosis or gangrene Sclera anicteric, throat clear and slightly dry No jvd or bruits Chest clear L base, R base decreased BS, no rales/ wheezing RRR no MRG Abd soft ntnd no mass or ascites +bs GU normal MS no joint effusions or deformity Ext no LE or UE edema, no wounds or ulcers Neuro is alert, Ox 3 , nf  RUA AVF +bruit         Home meds include - norvasc, auryxia 2 ac, breo ellipta, sensipar, lasix 80 on non hd days, hydralazine 50 tid, lopressor 50 bid, rena vit, renvela 2 ac tid     OP HD: MWF AF  4h  400/800  50kg   2/2 bath  Heparin 2000   RIJ TDC/ RUE AVF (maturing)  - sensipar 60 tiw  - venofer 50 weekly  - mircera 225 q2, last 10/19, due 11/2  Assessment/ Plan: PNA/ acute on chronic resp failure - on IV abx and HFNC. CXR suggestive of pna and/or edema.  Shock - probably septic from PNA, on pressors x 1 ESRD - on HD MWF. Missed last HD, poor compliance in general. Pt just had a seizure this afternoon after I saw him, so we will assume he is uremic and plan for CRRT to start tonight.  Volume - looks dry on exam, but up by weights. No peripheral edema. Low BP's. Keep even, pull fluid if comes off pressors.  MBD ckd - cont sensipar Anemia ckd - Hb 9-10 range, next esa due wed , will order      [name]  MD 07/01/2021, 4:17 PM  Recent Labs  Lab 06/30/21 0658 07/01/21 1329 07/01/21 1607  WBC 4.3 7.4  --   HGB 8.7* 10.4* 9.9*   Recent Labs  Lab 06/30/21 0658 07/01/21 1329 07/01/21 1607  K 4.2 5.2* 5.0  BUN 61* 68*  --   CREATININE 7.63* 9.22*  --   CALCIUM 9.2 8.9  --

## 2021-07-01 NOTE — Procedures (Signed)
Arterial Catheter Insertion Procedure Note  Thomas Robinson  [id number]  June 20, 1961  Date:07/01/21  Time:4:14 PM    Provider Performing: Judith Part    Procedure: Insertion of Arterial Line [phone]) without US guidance  Indication(s) Blood pressure monitoring and/or need for frequent ABGs  Consent Unable to obtain consent due to emergent nature of procedure.  Anesthesia None   Time Out Verified patient identification, verified procedure, site/side was marked, verified correct patient position, special equipment/implants available, medications/allergies/relevant history reviewed, required imaging and test results available.   Sterile Technique Maximal sterile technique including full sterile barrier drape, hand hygiene, sterile gown, sterile gloves, mask, hair covering, sterile ultrasound probe cover (if used).   Procedure Description Area of catheter insertion was cleaned with chlorhexidine and draped in sterile fashion. Without real-time ultrasound guidance an arterial catheter was placed into the left radial artery.  Appropriate arterial tracings confirmed on monitor.     Complications/Tolerance None; patient tolerated the procedure well.   EBL Minimal   Specimen(s) None

## 2021-07-01 NOTE — Progress Notes (Signed)
Patient is refusing all blood work. When asked about getting an ABG , the patient absolutely refused.Patient saturation remains stable at this time

## 2021-07-01 NOTE — Progress Notes (Signed)
[facility] Progress Note Patient Name: Thomas Robinson DOB: 10/08/1960 MRN: [id number]   Date of Service  07/01/2021  HPI/Events of Note  Patient is hypotensive after receiving his scheduled dose of anti-hypertensive medications., earlier concern for pulmonary edema.  eICU Interventions  Will start peripheral Phenylephrine gtt and hold AM doses of Hydralazine and Metoprolol.        Kerry Kass Jivan Symanski 07/01/2021, 5:13 AM

## 2021-07-01 NOTE — Procedures (Signed)
Patient Name: Thomas Robinson  MRN: [id number]  Epilepsy Attending: Lora Havens  Referring Physician/Provider: Dr Susie Cassette Date: 07/01/2021 Duration: 24.30 mins  Patient history: 60yo M patient was noted to seizure-like activity, he was found unresponsive after generalized tonic-clonic shaking.  Seizure activity stopped spontaneously after 1 minute. Post seizure patient was noted to be confused, hypoxic. EEG to evaluate for seizure.   Level of alertness: Awake, asleep  AEDs during EEG study: LEV  Technical aspects: This EEG study was done with scalp electrodes positioned according to the 10-20 International system of electrode placement. Electrical activity was acquired at a sampling rate of 500Hz  and reviewed with a high frequency filter of 70Hz  and a low frequency filter of 1Hz . EEG data were recorded continuously and digitally stored.   Description: No posterior dominant rhythm was seen. Sleep was characterized by sleep spindles (12 to 14 Hz), maximal frontocentral region. EEG showed continuous generalized 3 to 6 Hz theta-delta slowing. Hyperventilation and photic stimulation were not performed.     ABNORMALITY - Continuous slow, generalized  IMPRESSION: This study is suggestive of moderate diffuse encephalopathy, nonspecific etiology. No seizures or epileptiform discharges were seen throughout the recording.  [name] Barbra Sarks

## 2021-07-01 NOTE — Progress Notes (Signed)
After 2 attempts by 2 different RT's, the Artline was unsuccessful. Patient is refusing all blood sticks at this time.

## 2021-07-02 ENCOUNTER — Inpatient Hospital Stay (HOSPITAL_COMMUNITY): Payer: Medicare Other

## 2021-07-02 DIAGNOSIS — J90 Pleural effusion, not elsewhere classified: Secondary | ICD-10-CM

## 2021-07-02 DIAGNOSIS — I5032 Chronic diastolic (congestive) heart failure: Secondary | ICD-10-CM | POA: Diagnosis not present

## 2021-07-02 DIAGNOSIS — J181 Lobar pneumonia, unspecified organism: Secondary | ICD-10-CM

## 2021-07-02 DIAGNOSIS — L8990 Pressure ulcer of unspecified site, unspecified stage: Secondary | ICD-10-CM | POA: Insufficient documentation

## 2021-07-02 DIAGNOSIS — J9621 Acute and chronic respiratory failure with hypoxia: Secondary | ICD-10-CM | POA: Diagnosis not present

## 2021-07-02 DIAGNOSIS — J9620 Acute and chronic respiratory failure, unspecified whether with hypoxia or hypercapnia: Secondary | ICD-10-CM

## 2021-07-02 LAB — RENAL FUNCTION PANEL
Albumin: 2.3 g/dL — ABNORMAL LOW (ref 3.5–5.0)
Albumin: 2.3 g/dL — ABNORMAL LOW (ref 3.5–5.0)
Anion gap: 19 — ABNORMAL HIGH (ref 5–15)
Anion gap: 13 (ref 5–15)
BUN: 62 mg/dL — ABNORMAL HIGH (ref 8–23)
BUN: 39 mg/dL — ABNORMAL HIGH (ref 8–23)
CO2: 17 mmol/L — ABNORMAL LOW (ref 22–32)
CO2: 21 mmol/L — ABNORMAL LOW (ref 22–32)
Calcium: 8 mg/dL — ABNORMAL LOW (ref 8.9–10.3)
Calcium: 8.3 mg/dL — ABNORMAL LOW (ref 8.9–10.3)
Chloride: 93 mmol/L — ABNORMAL LOW (ref 98–111)
Chloride: 97 mmol/L — ABNORMAL LOW (ref 98–111)
Creatinine, Ser: 7.9 mg/dL — ABNORMAL HIGH (ref 0.61–1.24)
Creatinine, Ser: 5.52 mg/dL — ABNORMAL HIGH (ref 0.61–1.24)
GFR, Estimated: 7 mL/min — ABNORMAL LOW (ref >60)
GFR, Estimated: 11 mL/min — ABNORMAL LOW (ref >60)
Glucose, Bld: 119 mg/dL — ABNORMAL HIGH (ref 70–99)
Glucose, Bld: 158 mg/dL — ABNORMAL HIGH (ref 70–99)
Phosphorus: 8.1 mg/dL — ABNORMAL HIGH (ref 2.5–4.6)
Phosphorus: 5.3 mg/dL — ABNORMAL HIGH (ref 2.5–4.6)
Potassium: 4.7 mmol/L (ref 3.5–5.1)
Potassium: 4.8 mmol/L (ref 3.5–5.1)
Sodium: 129 mmol/L — ABNORMAL LOW (ref 135–145)
Sodium: 131 mmol/L — ABNORMAL LOW (ref 135–145)

## 2021-07-02 LAB — ECHOCARDIOGRAM COMPLETE
AV Mean grad: 12 mmHg
AV Peak grad: 21.7 mmHg
Ao pk vel: 2.33 m/s
S' Lateral: 1.7 cm
Weight: 2081.14 oz

## 2021-07-02 LAB — CBC
HCT: 28.1 % — ABNORMAL LOW (ref 39.0–52.0)
Hemoglobin: 9.1 g/dL — ABNORMAL LOW (ref 13.0–17.0)
MCH: 33 pg (ref 26.0–34.0)
MCHC: 32.4 g/dL (ref 30.0–36.0)
MCV: 101.8 fL — ABNORMAL HIGH (ref 80.0–100.0)
Platelets: 122 10*3/uL — ABNORMAL LOW (ref 150–400)
RBC: 2.76 MIL/uL — ABNORMAL LOW (ref 4.22–5.81)
RDW: 17.6 % — ABNORMAL HIGH (ref 11.5–15.5)
WBC: 7.2 10*3/uL (ref 4.0–10.5)
nRBC: 0 % (ref 0.0–0.2)

## 2021-07-02 LAB — GLUCOSE, CAPILLARY
Glucose-Capillary: 119 mg/dL — ABNORMAL HIGH (ref 70–99)
Glucose-Capillary: 138 mg/dL — ABNORMAL HIGH (ref 70–99)
Glucose-Capillary: 124 mg/dL — ABNORMAL HIGH (ref 70–99)
Glucose-Capillary: 129 mg/dL — ABNORMAL HIGH (ref 70–99)
Glucose-Capillary: 110 mg/dL — ABNORMAL HIGH (ref 70–99)
Glucose-Capillary: 102 mg/dL — ABNORMAL HIGH (ref 70–99)
Glucose-Capillary: 114 mg/dL — ABNORMAL HIGH (ref 70–99)
Glucose-Capillary: 101 mg/dL — ABNORMAL HIGH (ref 70–99)
Glucose-Capillary: 99 mg/dL (ref 70–99)
Glucose-Capillary: 112 mg/dL — ABNORMAL HIGH (ref 70–99)
Glucose-Capillary: 113 mg/dL — ABNORMAL HIGH (ref 70–99)
Glucose-Capillary: 121 mg/dL — ABNORMAL HIGH (ref 70–99)
Glucose-Capillary: 119 mg/dL — ABNORMAL HIGH (ref 70–99)
Glucose-Capillary: 145 mg/dL — ABNORMAL HIGH (ref 70–99)
Glucose-Capillary: 116 mg/dL — ABNORMAL HIGH (ref 70–99)
Glucose-Capillary: 114 mg/dL — ABNORMAL HIGH (ref 70–99)
Glucose-Capillary: 111 mg/dL — ABNORMAL HIGH (ref 70–99)

## 2021-07-02 LAB — PROTEIN, PLEURAL OR PERITONEAL FLUID: Total protein, fluid: <3 g/dL

## 2021-07-02 LAB — BODY FLUID CELL COUNT WITH DIFFERENTIAL
Eos, Fluid: 1 %
Lymphs, Fluid: 42 %
Monocyte-Macrophage-Serous Fluid: 9 % — ABNORMAL LOW (ref 50–90)
Neutrophil Count, Fluid: 48 % — ABNORMAL HIGH (ref 0–25)
Total Nucleated Cell Count, Fluid: 435 cu mm (ref 0–1000)

## 2021-07-02 LAB — HEPATITIS B SURFACE ANTIGEN: Hepatitis B Surface Ag: NONREACTIVE

## 2021-07-02 LAB — GLUCOSE, PLEURAL OR PERITONEAL FLUID: Glucose, Fluid: 126 mg/dL

## 2021-07-02 LAB — APTT: aPTT: 50 seconds — ABNORMAL HIGH (ref 24–36)

## 2021-07-02 LAB — LACTATE DEHYDROGENASE, PLEURAL OR PERITONEAL FLUID: LD, Fluid: 72 U/L — ABNORMAL HIGH (ref 3–23)

## 2021-07-02 LAB — LACTATE DEHYDROGENASE: LDH: 2293 U/L — ABNORMAL HIGH (ref 98–192)

## 2021-07-02 LAB — MAGNESIUM: Magnesium: 2.6 mg/dL — ABNORMAL HIGH (ref 1.7–2.4)

## 2021-07-02 LAB — PROTEIN, TOTAL: Total Protein: 5.8 g/dL — ABNORMAL LOW (ref 6.5–8.1)

## 2021-07-02 LAB — PROCALCITONIN: Procalcitonin: 6.55 ng/mL

## 2021-07-02 LAB — HEPATITIS B SURFACE ANTIBODY,QUALITATIVE: Hep B S Ab: NONREACTIVE

## 2021-07-02 LAB — TROPONIN I (HIGH SENSITIVITY): Troponin I (High Sensitivity): 202 ng/L (ref <18)

## 2021-07-02 MED ORDER — ALTEPLASE 2 MG IJ SOLR
2.0000 mg | Freq: Once | INTRAMUSCULAR | Status: AC
  Administered 2021-07-02: 1.6 mg
  Filled 2021-07-02: qty 2

## 2021-07-02 MED ORDER — HEPARIN SODIUM (PORCINE) 1000 UNIT/ML DIALYSIS: 2000.0000 [IU] | Freq: Once | INTRAMUSCULAR | Status: DC

## 2021-07-02 MED ORDER — NOREPINEPHRINE 4 MG/250ML-% IV SOLN
2.0000 ug/min | INTRAVENOUS | Status: DC
  Administered 2021-07-02: 2 ug/min via INTRAVENOUS

## 2021-07-02 MED ORDER — MELATONIN 3 MG PO TABS
3.0000 mg | ORAL_TABLET | Freq: Every day | ORAL | Status: DC
  Administered 2021-07-03 – 2021-07-04 (×3): 3 mg via ORAL
  Filled 2021-07-02 (×3): qty 1

## 2021-07-02 MED ORDER — SODIUM CHLORIDE 0.9% FLUSH
10.0000 mL | Freq: Two times a day (BID) | INTRAVENOUS | Status: DC
  Administered 2021-07-02 – 2021-07-05 (×6): 10 mL

## 2021-07-02 MED ORDER — NOREPINEPHRINE 4 MG/250ML-% IV SOLN
0.0000 ug/min | INTRAVENOUS | Status: DC
  Filled 2021-07-02: qty 250

## 2021-07-02 MED ORDER — PIPERACILLIN-TAZOBACTAM IN DEX 2-0.25 GM/50ML IV SOLN
2.2500 g | Freq: Three times a day (TID) | INTRAVENOUS | Status: DC
  Administered 2021-07-03 – 2021-07-04 (×6): 2.25 g via INTRAVENOUS
  Filled 2021-07-02 (×10): qty 50

## 2021-07-02 MED ORDER — CHLORHEXIDINE GLUCONATE CLOTH 2 % EX PADS
6.0000 | MEDICATED_PAD | Freq: Every day | CUTANEOUS | Status: DC
  Administered 2021-07-02 – 2021-07-04 (×2): 6 via TOPICAL

## 2021-07-02 MED ORDER — SODIUM CHLORIDE 0.9% FLUSH: 10.0000 mL | INTRAVENOUS | Status: DC | PRN

## 2021-07-02 MED ORDER — HYDROCORTISONE SOD SUC (PF) 100 MG IJ SOLR
100.0000 mg | Freq: Two times a day (BID) | INTRAMUSCULAR | Status: DC
  Administered 2021-07-02 – 2021-07-03 (×3): 100 mg via INTRAVENOUS
  Filled 2021-07-02 (×3): qty 2

## 2021-07-02 NOTE — Progress Notes (Signed)
End of shift note:   Events throughout the night 19:15 received handoff report from [place], [place]. Updated on patients' "episodes" where he goes unresponsive, has a left eye deviation, drops his systolic blood pressure to 80-90, drops sats and develops rigidity in upper extremities.   19:30: patient has an "episode" of left eye deviation, unresponsiveness, and low blood pressure on arterial line. Immediately prior to this patient became nauseous and vomited.  Blood sugar checked, it was 62, treated per hypoglycemic protocol, on recheck CBG was 147. E-link MD made aware, orders received to increase D10 gtt from 30 to 50 cc/hr. EGG currently being performed. Orders received to check blood sugars q1 and CT of head ordered to be performed prior to starting CRRT.    Blood sugars continued to be checked qh.  2100- CBG 85 2200- CBG 70 2300- Patient has another "episode" of nausea, dry heaving, left eye deviation, grunting and brief unresponsiveness. CBG 66, treated per hypoglycemic protocol. CBG recheck 134. E-link MD aware, new orders received to increase D10 infusion from 50 to 100cc/hr. Ground team at bedside, more orders received to for calcium replacement of 2 grams IV and continue to monitor for these "episodes".   2312- CRRT initiated.  0000- CBG 119 0026- After starting CRRT for about 1 hour, I began having high access pressures. I trouble shot the machine, the patient and the HD cath. I no longer have blood return coming from the red port. I have exhausted all bedside resources, manipulating patient position, power flushing, switching accesses.....  Unable to return blood to patient. IV Team consulted at this time. Its determined the port requires TPA. Blue port also TPA'ed. Dwell time is 2 hrs, this will delay restarting CRRT. Elink MD, Dr. Jeannene Patella made aware.  01:00- CBG 135 02:00- CBG 124 03:00- CBG 129 03:40-CRRT initiated 04:00- CBG 110 05:00- CBG 114 06:00- CBG 102

## 2021-07-02 NOTE — Procedures (Signed)
Chest [place]   Ultrasound chest was performed, showing moderate to large right-sided pleural effusion, atelectasis/infiltrate on left lower lobe    Jacky Kindle MD [place] [facility] Critical Care See Amion for pager If no response to pager, please call [phone] until 7pm After 7pm, Please call E-link [phone]

## 2021-07-02 NOTE — Progress Notes (Signed)
  Echocardiogram 2D Echocardiogram has been performed.  Thomas Robinson F 07/02/2021, 5:22 PM

## 2021-07-02 NOTE — Procedures (Signed)
Thoracentesis  Procedure Note  Thomas Robinson  [id number]  December 25, 1960  Date:07/02/21  Time:3:01 PM   Provider Performing:Inara Dike   Procedure: Thoracentesis with imaging guidance (41583)  Indication(s) Pleural Effusion  Consent Risks of the procedure as well as the alternatives and risks of each were explained to the patient and/or caregiver.  Consent for the procedure was obtained and is signed in the bedside chart  Anesthesia Topical only with 1% lidocaine    Time Out Verified patient identification, verified procedure, site/side was marked, verified correct patient position, special equipment/implants available, medications/allergies/relevant history reviewed, required imaging and test results available.   Sterile Technique Maximal sterile technique including full sterile barrier drape, hand hygiene, sterile gown, sterile gloves, mask, hair covering, sterile ultrasound probe cover (if used).  Procedure Description Ultrasound was used to identify appropriate pleural anatomy for placement and overlying skin marked.  Area of drainage cleaned and draped in sterile fashion. Lidocaine was used to anesthetize the skin and subcutaneous tissue.  1500 cc's of Straw appearing fluid was drained from the right pleural space. Catheter then removed and bandaid applied to site.   Complications/Tolerance None; patient tolerated the procedure well. Chest X-ray is ordered to confirm no post-procedural complication.   EBL Minimal   Specimen(s) Pleural fluid

## 2021-07-02 NOTE — Progress Notes (Signed)
Per verbal from Dr. Marva Panda turn 10% dextrose off.

## 2021-07-02 NOTE — Progress Notes (Signed)
[facility] Progress Note  Subjective: seen in room, getting D10, had more seizures last night/ this am related to low BS's, started on D10 at 30 cc/hr and is now up to 100 cc/hr to try and keep BS up.  Pt w/o new c/o's. On HFNC, weaned off pressors now.   Vitals:   07/02/21 1115 07/02/21 1130 07/02/21 1145 07/02/21 1200  BP:      Pulse: (!) 113 (!) 112 (!) 114 (!) 112  Resp: 14 11 13 14   Temp:      TempSrc:      SpO2: 100% 100% 100% 100%  Weight:        Exam:   alert, nad   no jvd  Chest clear L, R base dc'd BS  Cor reg no RG  Abd soft ntnd no ascites   Ext no LE edema   Alert, NF, ox3   RUA AVF+bruit/ R IJ TDC    Home meds include - norvasc, auryxia 2 ac, breo ellipta, sensipar, lasix 80 on non hd days, hydralazine 50 tid, lopressor 50 bid, rena vit, renvela 2 ac tid        OP HD: MWF AF  4h  400/800  50kg   2/2 bath  Heparin 2000   RIJ TDC/ RUE AVF (maturing)  - sensipar 60 tiw  - venofer 50 weekly  - mircera 225 q2, last 10/19, due 11/2   Assessment/ Plan: PNA bilat/ acute on chronic resp failure - on IV abx and HFNC.  Shock - septic, is weaning off of neo gtt this am. BP's better.  ESRD - on HD MWF. Missed last HD, poor compliance in general. SP CRRT started last night. Doing better today and seizures controlled w/ IV D10. Will dc CRRT. Plan regular HD tomorrow.  Volume - looks dry on exam, no edema on exam. Low BP's are improving MBD ckd - cont sensipar Anemia ckd - Hb 9-10 range, next esa due wed , will order         Rob Abby Stines 07/02/2021, 12:22 PM   Recent Labs  Lab 07/01/21 1329 07/01/21 1607 07/02/21 0420  K 5.2* 5.0 4.7  BUN 68*  --  62*  CREATININE 9.22*  --  7.90*  CALCIUM 8.9  --  8.0*  PHOS  --   --  8.1*  HGB 10.4* 9.9* 9.1*   Inpatient medications:  albuterol  2.5 mg Nebulization Q6H   budesonide (PULMICORT) nebulizer solution  0.25 mg Nebulization BID   Chlorhexidine Gluconate Cloth  6 each Topical Q0600    cinacalcet  60 mg Oral QPM   ferric citrate  420 mg Oral TID WC   [START ON 07/03/2021] heparin  2,000 Units Dialysis Once in dialysis   heparin  5,000 Units Subcutaneous Q8H   hydrocortisone sod succinate (SOLU-CORTEF) inj  100 mg Intravenous Q12H   mouth rinse  15 mL Mouth Rinse BID   multivitamin  1 tablet Oral Daily   pantoprazole (PROTONIX) IV  40 mg Intravenous QHS   sevelamer carbonate  1,600 mg Oral TID WC   sodium chloride flush  10-40 mL Intracatheter Q12H     prismasol BGK 4/2.5 500 mL/hr at 07/02/21 0340    prismasol BGK 4/2.5 500 mL/hr at 07/02/21 0340   sodium chloride     dextrose 100 mL/hr at 07/02/21 1100   heparin 10,000 units/ 20 mL infusion syringe 250 Units/hr (07/02/21 1100)   norepinephrine (LEVOPHED) Adult infusion 2 mcg/min (07/02/21 1134)  ondansetron (ZOFRAN) IV Stopped (07/02/21 0130)   piperacillin-tazobactam 3.375 g (07/02/21 1145)   prismasol BGK 4/2.5 1,500 mL/hr at 07/02/21 1031   acetaminophen, albuterol, alteplase, benzonatate, chlorpheniramine-HYDROcodone, docusate sodium, heparin, ondansetron (ZOFRAN) IV, polyethylene glycol, sodium chloride, sodium chloride flush

## 2021-07-02 NOTE — Progress Notes (Signed)
PHARMACY NOTE:  ANTIMICROBIAL RENAL DOSAGE ADJUSTMENT  Current antimicrobial regimen includes a mismatch between antimicrobial dosage and estimated renal function.  As per policy approved by the Pharmacy & Therapeutics and Medical Executive Committees, the antimicrobial dosage will be adjusted accordingly.  Current antimicrobial dosage:  Zosyn 3.375 gm IV Q 6 htd  Indication:  HCAP  Renal Function:  Estimated Creatinine Clearance: 11.6 mL/min (A) (by C-G formula based on SCr of 5.52 mg/dL (H)). []      On intermittent HD, scheduled: []      On CRRT    Antimicrobial dosage has been changed to:  Zosyn 2.25 gm IV Q 8 hrs  Additional comments: Pt no longer on CRRT; per renal note, plan to start HD tomorrow  Thank you for allowing pharmacy to be a part of this patient's care.  Gillermina Hu, PharmD, BCPS, [facility] Clinical Pharmacist 07/02/2021 10:04 PM

## 2021-07-02 NOTE — Progress Notes (Addendum)
NAME:  Thomas Robinson, MRN:  [id number], DOB:  1960-09-15, LOS: 2 ADMISSION DATE:  06/29/2021, CONSULTATION DATE:  06/29/2021 REFERRING MD:  [facility], CHIEF COMPLAINT:  weakness, hypoxia   History of Present Illness:  [name] is a 60 year old male with PMHx of chronic diastolic heart failure, CVA, chronic low back pain, ESRD on HD MWF, hyperpilidemia, iron deficiency anemia, restless leg syndrome, thoracic ascending aortic aneurysm, chronic hypoxic resp failure on 2.5L O2 at baseline, hypertension who initially presented to [facility] with generalized weakness and hypoxia to the 70's on room air. Patient also endorsed severe extertional dyspnea associated with chest discomfort and tighthness and abdominal discomfort, fatigue and weakness and nonproductive cough and diffuse pain with intermittent delirium at home.   In the ED, patient required BiPAP intermittently for hypoxia but was able to saturate well on 5L O2. CTA Chest obtained that was negative for pulmonary embolism but did demonstrate moderate right sided plueral effusion, diffuse ground glass opacities with smooth interlobular septal thickening; RLL consolidation with hypodensity measure 4x5cm with small pericardial effusion.   Pertinent  Medical History   Past Medical History:  Diagnosis Date   Anaphylactic shock, unspecified, sequela 06/10/2019   Aortic stenosis    mild-moderate AS 12/2020 echo   Arthritis    Asthma, chronic, unspecified asthma severity, with acute exacerbation 10/23/2017   CAP (community acquired pneumonia) 10/23/2017   Carpal tunnel syndrome of right wrist 10/05/2018   Cataract    right - removed by surgery   Cerebral infarction due to thrombosis of cerebral artery (HCC)    Cervical disc herniation 01/04/2020   Chronic diastolic heart failure ([place]) 04/13/2018   Chronic low back pain 11/24/2019   Constipation    Cough    chronic cough   Dyspnea    Encounter for immunization 07/08/2017   ESRD on hemodialysis  ([place]) 02/16/2018   Fall 06/04/2019   GERD (gastroesophageal reflux disease) 06/04/2019   GI bleeding 05/24/2017   Gram-negative sepsis, unspecified ([place]) 09/05/2017   History of fusion of cervical spine 03/26/2020   Hyperlipidemia    Hypertension    Hypokalemia 06/04/2017   Iron deficiency anemia, unspecified 09/09/2017   Left shoulder pain 11/11/2019   Lumbar radiculopathy 11/24/2019   Lung contusion 06/04/2019   LVH (left ventricular hypertrophy) due to hypertensive disease, with heart failure ([place]) 06/18/2020   Macrocytic anemia 05/24/2017   Moderate protein-calorie malnutrition ([place]) 06/06/2017   Myofascial pain syndrome 06/12/2020   Neuritis of right ulnar nerve 09/13/2018   Non-compliance with renal dialysis ([place]) 02/08/2020   Oxygen deficiency 12/30/2019   O2 sats on RA 87% at PAT appt    Pain in joint of right elbow 09/13/2018   Pulmonary hypertension ([place])    Renovascular hypertension 06/22/2020   Restless leg syndrome    Rib fractures 06/2019   Right   S/P cardiac cath 08/27/2020   normal coronary arteries.   Secondary hyperparathyroidism of renal origin ([place]) 06/04/2017   Thoracic ascending aortic aneurysm    4.5 cm 06/04/19 CT   Ulnar neuropathy 10/05/2018   Volume overload 10/23/2017     Significant Hospital Events: Including procedures, antibiotic start and stop dates in addition to other pertinent events   10/29> admitted to ICU, started on linezolid, zosyn and cefepime 10/31> continued on Zosyn for RLL pneumonia; requires pressor support; multiple hypoglycemic episodes for which patient given D50 and started on D10 gtt; STAT EEG neg for seizure activity; CRRT overnight  Interim History / Subjective:  Patient reports feeling weak overall; no acute concerns at this time.  Weaned off neo. Glucose stable this morning. Discussed need for thoracentesis for which patient is agreeable  Objective   Blood pressure (!) 100/11, pulse (!) 108, temperature (!) 94 F  (34.4 C), temperature source Oral, resp. rate 14, weight 59 kg, SpO2 100 %.    Intake/Output Summary (Last 24 hours) at 07/02/2021 0839 Last data filed at 07/02/2021 0800 Gross per 24 hour  Intake 2410.29 ml  Output 497 ml  Net 1913.29 ml   Filed Weights   07/01/21 0433 07/01/21 1300 07/02/21 0500  Weight: 58.2 kg 59.2 kg 59 kg    Examination: General: chronically ill appearing male, resting comfortably in bed HENT: /AT, EOMI Lungs: diminished breath sounds at R base; coarse breath sounds on right side and left lower lobe; on 10L HFNC; ;large R sided pleural effusion confirmed on bedside US Cardiovascular: RRR, S1 and S2 present, +systolic murmur, no rubs/gallops, distal pulses intact  Abdomen: soft, nondistended, nontender +BS Extremities: warm and dry; no peripheral edema noted Neuro: alert/oriented x4; following commands appropriately Skin: no rash noted  Resolved Hospital Problem list    Assessment & Plan:  Acute on chronic hypoxic respiratory failure 2/2 right sided pleural effusion and RLL consolidation Patient presented with worsening respiratory status for one week duration. He was initially started on linezolid, zosyn and cefepime. He is continued on zosyn.  - Urine Legionella and strep pneumo pending  - Pulmicort bid - Continue Zosyn for RLL consolidation - Thoracentesis today; will follow up pleural fluid studies  ESRD on HD Patient with hx of ESRD on HD with intermittent compliance. Does not appear to be encephalopathic or hypervolemic at this time or requiring emergent HD. - Nephrology consulted - HD per nephrology  Possible adrenal crisis  Hypoglycemia, hypothermia  Patient had multiple hypoglycemic episodes yesterday and overnight. Received one dose of octreotide and has been on D10 gtt. This morning, was also noted to be hypothermic to 80F. Concern for possible adrenal insufficiency.  - Trial of SoluCortef 100mg  q12h -CBG monitoring   Macrocytic anemia:   Anemia of chronic disease 2/2 renal failure: Appears to be chronic and stable. Previous vitamin B12 and folate levels wnl.  - Continue to monitor Hb  - Continue auryxia - Transfuse if  Hb <7  Troponinemia:  Elevated trop to 200; patient does not endorse any chest pain at this time. This is likely in setting of acute illness. - Trend troponins  - F/u EKG  Best Practice (right click and "Reselect all SmartList Selections" daily)   Diet/type: Regular consistency (see orders) DVT prophylaxis: systemic heparin GI prophylaxis: PPI Lines: Arterial Line Foley:  Yes, and it is still needed Code Status:  full code Last date of multidisciplinary goals of care discussion [11/1- patient updated ]  Labs   CBC: Recent Labs  Lab 06/29/21 2237 06/30/21 0658 07/01/21 1329 07/01/21 1607 07/02/21 0420  WBC 4.4 4.3 7.4  --  7.2  NEUTROABS 3.6  --   --   --   --   HGB 9.8* 8.7* 10.4* 9.9* 9.1*  HCT 30.5* 27.4* 33.1* 29.0* 28.1*  MCV 104.1* 104.2* 104.4*  --  101.8*  PLT 181 131* 130*  --  122*    Basic Metabolic Panel: Recent Labs  Lab 06/29/21 2237 06/30/21 0658 07/01/21 1329 07/01/21 1607 07/02/21 0420  NA 137 137 137 131* 129*  K 5.5* 4.2 5.2* 5.0 4.7  CL 95* 98 94*  --  93*  CO2 27 24 23   --  17*  GLUCOSE 107* 90 88  --  119*  BUN 55* 61* 68*  --  62*  CREATININE 6.71* 7.63* 9.22*  --  7.90*  CALCIUM 9.3 9.2 8.9  --  8.0*  MG  --  2.4  --   --  2.6*  PHOS  --   --   --   --  8.1*   GFR: Estimated Creatinine Clearance: 8.1 mL/min (A) (by C-G formula based on SCr of 7.9 mg/dL (H)). Recent Labs  Lab 06/29/21 2237 06/30/21 0250 06/30/21 0658 07/01/21 0255 07/01/21 1329 07/01/21 1335 07/02/21 0420  PROCALCITON  --   --  0.96 1.36  --   --  6.55  WBC 4.4  --  4.3  --  7.4  --  7.2  LATICACIDVEN  --  0.7 0.8  --   --  2.6*  --     Liver Function Tests: Recent Labs  Lab 06/29/21 2237 07/01/21 1329 07/02/21 0420  AST 61* 74*  --   ALT 6 29  --   ALKPHOS 80 73   --   BILITOT 1.4* 1.2  --   PROT 7.4 6.3*  --   ALBUMIN 3.3* 2.7* 2.3*   Recent Labs  Lab 07/01/21 1329  LIPASE 44  AMYLASE 93   No results for input(s): AMMONIA in the last 168 hours.  ABG    Component Value Date/Time   PHART 7.296 (L) 07/01/2021 1607   PCO2ART 36.9 07/01/2021 1607   PO2ART 100 07/01/2021 1607   HCO3 18.1 (L) 07/01/2021 1607   TCO2 19 (L) 07/01/2021 1607   ACIDBASEDEF 8.0 (H) 07/01/2021 1607   O2SAT 97.0 07/01/2021 1607     Coagulation Profile: Recent Labs  Lab 06/30/21 0258 07/01/21 1329  INR 1.3* 1.6*    Cardiac Enzymes: No results for input(s): CKTOTAL, CKMB, CKMBINDEX, TROPONINI in the last 168 hours.  HbA1C: Hgb A1c MFr Bld  Date/Time Value Ref Range Status  07/01/2021 01:31 PM 4.6 (L) 4.8 - 5.6 % Final    Comment:    (NOTE) Pre diabetes:          5.7%-6.4%  Diabetes:              >6.4%  Glycemic control for   <7.0% adults with diabetes   01/04/2020 05:49 PM 4.9 4.8 - 5.6 % Final    Comment:    (NOTE) Pre diabetes:          5.7%-6.4% Diabetes:              >6.4% Glycemic control for   <7.0% adults with diabetes     CBG: Recent Labs  Lab 07/02/21 0406 07/02/21 0501 07/02/21 0603 07/02/21 0702 07/02/21 0747  GLUCAP 110* 114* 102* 101* 99     Critical care time: 35 minutes    Harvie Heck, MD Internal Medicine, PGY-3 07/02/21 1:26 PM Pager # [phone]

## 2021-07-03 DIAGNOSIS — R918 Other nonspecific abnormal finding of lung field: Secondary | ICD-10-CM

## 2021-07-03 DIAGNOSIS — J90 Pleural effusion, not elsewhere classified: Secondary | ICD-10-CM | POA: Diagnosis not present

## 2021-07-03 DIAGNOSIS — J9620 Acute and chronic respiratory failure, unspecified whether with hypoxia or hypercapnia: Secondary | ICD-10-CM | POA: Diagnosis not present

## 2021-07-03 LAB — COMPREHENSIVE METABOLIC PANEL
ALT: 1405 U/L — ABNORMAL HIGH (ref 0–44)
ALT: 1296 U/L — ABNORMAL HIGH (ref 0–44)
AST: 2105 U/L — ABNORMAL HIGH (ref 15–41)
AST: 1389 U/L — ABNORMAL HIGH (ref 15–41)
Albumin: 2.3 g/dL — ABNORMAL LOW (ref 3.5–5.0)
Albumin: 2.2 g/dL — ABNORMAL LOW (ref 3.5–5.0)
Alkaline Phosphatase: 78 U/L (ref 38–126)
Alkaline Phosphatase: 79 U/L (ref 38–126)
Anion gap: 15 (ref 5–15)
Anion gap: 14 (ref 5–15)
BUN: 45 mg/dL — ABNORMAL HIGH (ref 8–23)
BUN: 51 mg/dL — ABNORMAL HIGH (ref 8–23)
CO2: 18 mmol/L — ABNORMAL LOW (ref 22–32)
CO2: 19 mmol/L — ABNORMAL LOW (ref 22–32)
Calcium: 8.4 mg/dL — ABNORMAL LOW (ref 8.9–10.3)
Calcium: 8.3 mg/dL — ABNORMAL LOW (ref 8.9–10.3)
Chloride: 96 mmol/L — ABNORMAL LOW (ref 98–111)
Chloride: 98 mmol/L (ref 98–111)
Creatinine, Ser: 6.46 mg/dL — ABNORMAL HIGH (ref 0.61–1.24)
Creatinine, Ser: 6.78 mg/dL — ABNORMAL HIGH (ref 0.61–1.24)
GFR, Estimated: 9 mL/min — ABNORMAL LOW (ref >60)
GFR, Estimated: 9 mL/min — ABNORMAL LOW (ref >60)
Glucose, Bld: 120 mg/dL — ABNORMAL HIGH (ref 70–99)
Glucose, Bld: 147 mg/dL — ABNORMAL HIGH (ref 70–99)
Potassium: 5.3 mmol/L — ABNORMAL HIGH (ref 3.5–5.1)
Potassium: 5.4 mmol/L — ABNORMAL HIGH (ref 3.5–5.1)
Sodium: 129 mmol/L — ABNORMAL LOW (ref 135–145)
Sodium: 131 mmol/L — ABNORMAL LOW (ref 135–145)
Total Bilirubin: 1.1 mg/dL (ref 0.3–1.2)
Total Bilirubin: 1.2 mg/dL (ref 0.3–1.2)
Total Protein: 5.7 g/dL — ABNORMAL LOW (ref 6.5–8.1)
Total Protein: 5.4 g/dL — ABNORMAL LOW (ref 6.5–8.1)

## 2021-07-03 LAB — CBC
HCT: 28.9 % — ABNORMAL LOW (ref 39.0–52.0)
Hemoglobin: 9.1 g/dL — ABNORMAL LOW (ref 13.0–17.0)
MCH: 32 pg (ref 26.0–34.0)
MCHC: 31.5 g/dL (ref 30.0–36.0)
MCV: 101.8 fL — ABNORMAL HIGH (ref 80.0–100.0)
Platelets: 99 10*3/uL — ABNORMAL LOW (ref 150–400)
RBC: 2.84 MIL/uL — ABNORMAL LOW (ref 4.22–5.81)
RDW: 17.2 % — ABNORMAL HIGH (ref 11.5–15.5)
WBC: 7.4 10*3/uL (ref 4.0–10.5)
nRBC: 0.4 % — ABNORMAL HIGH (ref 0.0–0.2)

## 2021-07-03 LAB — GLUCOSE, CAPILLARY
Glucose-Capillary: 119 mg/dL — ABNORMAL HIGH (ref 70–99)
Glucose-Capillary: 109 mg/dL — ABNORMAL HIGH (ref 70–99)
Glucose-Capillary: 139 mg/dL — ABNORMAL HIGH (ref 70–99)
Glucose-Capillary: 132 mg/dL — ABNORMAL HIGH (ref 70–99)
Glucose-Capillary: 135 mg/dL — ABNORMAL HIGH (ref 70–99)

## 2021-07-03 LAB — CK: Total CK: 30 U/L — ABNORMAL LOW (ref 49–397)

## 2021-07-03 LAB — HEPATITIS B SURFACE ANTIBODY, QUANTITATIVE: Hep B S AB Quant (Post): <3.1 m[IU]/mL — ABNORMAL LOW (ref >9.9)

## 2021-07-03 LAB — PROTIME-INR
INR: 2.1 — ABNORMAL HIGH (ref 0.8–1.2)
Prothrombin Time: 23.9 seconds — ABNORMAL HIGH (ref 11.4–15.2)

## 2021-07-03 MED ORDER — MIDODRINE HCL 5 MG PO TABS
10.0000 mg | ORAL_TABLET | Freq: Three times a day (TID) | ORAL | Status: DC
  Administered 2021-07-03 – 2021-07-05 (×7): 10 mg via ORAL
  Filled 2021-07-03 (×7): qty 2

## 2021-07-03 MED ORDER — NEPRO/CARBSTEADY PO LIQD
237.0000 mL | Freq: Three times a day (TID) | ORAL | Status: DC
  Administered 2021-07-03 – 2021-07-04 (×2): 237 mL via ORAL

## 2021-07-03 MED ORDER — MIDODRINE HCL 5 MG PO TABS: 10.0000 mg | ORAL_TABLET | Freq: Three times a day (TID) | ORAL | Status: DC

## 2021-07-03 MED ORDER — OXYCODONE HCL 5 MG PO TABS
5.0000 mg | ORAL_TABLET | Freq: Once | ORAL | Status: AC
  Administered 2021-07-03: 5 mg via ORAL
  Filled 2021-07-03: qty 1

## 2021-07-03 NOTE — Progress Notes (Signed)
Patient was transferred to 2W 10. Report  called to Maryln Gottron, RN.

## 2021-07-03 NOTE — Progress Notes (Signed)
Patient called out asking for help going to the bathroom. This RN went to room to assist patient at which time patient stated he wanted to use the bedside commode. I advised patient it was safest to use the bedpan as he has been complaining of weakness all day and refusing to get out of the bed and work with PT/OT. Patient became belligerent and stated, "I'll just shit in the bed then and you'll have to clean it up." Patient then picked up the call bell and slung it into the side rail of the bed and continued cussing. I told him I would get another staff member to assist [place] to the bedside commode at which time he said, "Well hurry the hell up, if I say I need to do something I need to do it right now." Another RN came to the room to assist me with the patient. The patient refused to allow [place] to put socks on him, ripped his telemetry leads and oxygen tubing off and stood up by himself to get on the bedside commode with staff standing beside him. I placed the oxygen tubing back on the patient. After the patient was finished using the bedside commode, this RN and another staff member assisted patient back to bed, attached the telemetry leads and gave the patient his call bell. The patient asked if I was just going to be mad at him at which time I stated, "The way you spoke to me and the behavior you displayed was very disrespectful and unnecessary. We (the staff) are here to help you manage your healthcare needs but you have responsibilities as a patient to participate in your own care and that we (the staff) are not here to act as servants to whatever you are demanding at the time." I made sure the patient's bed alarm was on, the call bell was in reach and the patient had all necessary personal items with him then exited the room.

## 2021-07-03 NOTE — Progress Notes (Signed)
[facility] Progress Note  Subjective: seen in room, off pressors, BP's 130's.  No more seizures. F/U CXR showing good clearing on L. R thora done yest 11/1. Breathing better.   Vitals:   07/03/21 0800 07/03/21 0900 07/03/21 1000 07/03/21 1100  BP:      Pulse:  (!) 120 (!) 118 (!) 118  Resp: 20 15 17 14   Temp:      TempSrc:      SpO2: 97% 98% 92% 94%  Weight:        Exam:   alert, nad   no jvd  Chest clear L, R base dc'd BS  Cor reg no RG  Abd soft ntnd no ascites   Ext no LE edema   Alert, NF, ox3   RUA AVF+bruit/ R IJ TDC    Home meds include - norvasc, auryxia 2 ac, breo ellipta, sensipar, lasix 80 on non hd days, hydralazine 50 tid, lopressor 50 bid, rena vit, renvela 2 ac tid        OP HD: MWF AF  4h  400/800  50kg   2/2 bath  Heparin 2000   RIJ TDC/ RUE AVF (maturing)  - sensipar 60 tiw  - venofer 50 weekly  - mircera 225 q2, last 10/19, due 11/2   Assessment/ Plan: PNA bilat/ acute on chronic resp failure - on IV abx and HFNC. Sp thora 1.5 L 11/1. Improving.  Shock - sepsis related, off pressors, on midodrine. BP's better.  ESRD - on HD MWF. Missed last OP HD. Did CRRT here 10/31 - 11/1 d/t hypotension/ shock. Off CRRT now. Plan HD later today/ tonight, or possibly tomorrow am.  Volume - looks dry on exam, no edema on exam. Up by wts.  MBD ckd - cont sensipar Anemia ckd - Hb 9-10 range, next esa due wed , will order         Rob Menno Robinson 07/03/2021, 11:06 AM   Recent Labs  Lab 07/02/21 0420 07/02/21 1600 07/03/21 0409 07/03/21 0412  K 4.7 4.8 5.3*  --   BUN 62* 39* 45*  --   CREATININE 7.90* 5.52* 6.46*  --   CALCIUM 8.0* 8.3* 8.4*  --   PHOS 8.1* 5.3*  --   --   HGB 9.1*  --   --  9.1*    Inpatient medications:  albuterol  2.5 mg Nebulization Q6H   budesonide (PULMICORT) nebulizer solution  0.25 mg Nebulization BID   Chlorhexidine Gluconate Cloth  6 each Topical Q0600   cinacalcet  60 mg Oral QPM   ferric citrate  420 mg Oral  TID WC   heparin  2,000 Units Dialysis Once in dialysis   heparin  5,000 Units Subcutaneous Q8H   mouth rinse  15 mL Mouth Rinse BID   melatonin  3 mg Oral QHS   midodrine  10 mg Oral TID WC   multivitamin  1 tablet Oral Daily   pantoprazole (PROTONIX) IV  40 mg Intravenous QHS   sevelamer carbonate  1,600 mg Oral TID WC   sodium chloride flush  10-40 mL Intracatheter Q12H    sodium chloride     dextrose Stopped (07/02/21 1620)   norepinephrine (LEVOPHED) Adult infusion Stopped (07/03/21 0825)   ondansetron (ZOFRAN) IV Stopped (07/02/21 1847)   piperacillin-tazobactam (ZOSYN)  IV Stopped (07/03/21 0953)   albuterol, benzonatate, chlorpheniramine-HYDROcodone, docusate sodium, ondansetron (ZOFRAN) IV, polyethylene glycol, sodium chloride flush

## 2021-07-03 NOTE — Evaluation (Signed)
Physical Therapy Evaluation Patient Details Name: Thomas Robinson MRN: [id number] DOB: 09/06/1960 Today's Date: 07/03/2021  History of Present Illness  Mr Oshen Wlodarczyk is a 60 year old male with PMHx of chronic diastolic heart failure, CVA, chronic low back pain, ESRD on HD MWF, hyperpilidemia, iron deficiency anemia, restless leg syndrome, thoracic ascending aortic aneurysm, chronic hypoxic resp failure on 2.5L O2 at baseline, hypertension who initially presented to [facility] with generalized weakness and hypoxia to the 70's on room air. Patient also endorsed severe extertional dyspnea associated with chest discomfort and tighthness and abdominal discomfort, fatigue and weakness and nonproductive cough and diffuse pain with intermittent delirium at home.  Clinical Impression  Patient presents with weakness, decreased activity tolerance and imbalance with history of falls PTA.  He refused even EOB mobility threatening to attempt to leave if I made him get up since he feels he really needs to just rest.  Encouraged mobility to improve strength as he is reporting weakness when he sits up, but continued to decline.  PT will continue to follow and further assess his abilities.  Considering level of mobility PTA with prior falls and pt so weak he doesn't want to sit up, he will likely need post acute inpatient rehab of some sort.  If he refuses and prefers home will need maximum Home health services.        Recommendations for follow up therapy are one component of a multi-disciplinary discharge planning process, led by the attending physician.  Recommendations may be updated based on patient status, additional functional criteria and insurance authorization.  Follow Up Recommendations Skilled nursing-short term rehab (<3 hours/day)    Assistance Recommended at Discharge Frequent or constant Supervision/Assistance  Functional Status Assessment Patient has had a recent decline in their functional status and/or  demonstrates limited ability to make significant improvements in function in a reasonable and predictable amount of time  Equipment Recommendations  Other (comment) (TBA)    Recommendations for Other Services       Precautions / Restrictions Precautions Precautions: Fall Precaution Comments: watch BP      Mobility  Bed Mobility Overal bed mobility: Needs Assistance             General bed mobility comments: scooting to [facility] with +2 A; patient declined all other mobility due to wanting to "just rest" despite encouragement    Transfers                        Ambulation/Gait                Stairs            Wheelchair Mobility    Modified Rankin (Stroke Patients Only)       Balance                                             Pertinent Vitals/Pain Pain Assessment: No/denies pain    Home Living Family/patient expects to be discharged to:: Private residence     Type of Home: House Home Access: Stairs to enter Entrance Stairs-Rails: None Entrance Stairs-Number of Steps: 2   Home Layout: One level Home Equipment: Rollator (4 wheels);Cane - single point      Prior Function Prior Level of Function : History of Falls (last six months);Independent/Modified Independent  Mobility Comments: using walker intermittently       Hand Dominance   Dominant Hand: Right    Extremity/Trunk Assessment   Upper Extremity Assessment Upper Extremity Assessment: Generalized weakness    Lower Extremity Assessment Lower Extremity Assessment: Generalized weakness       Communication   Communication: No difficulties  Cognition Arousal/Alertness: Awake/alert Behavior During Therapy: WFL for tasks assessed/performed Overall Cognitive Status: Within Functional Limits for tasks assessed                                          General Comments      Exercises     Assessment/Plan    PT  Assessment Patient needs continued PT services  PT Problem List Decreased strength;Decreased mobility;Decreased activity tolerance;Decreased balance;Decreased knowledge of use of DME;Cardiopulmonary status limiting activity       PT Treatment Interventions Therapeutic activities;Therapeutic exercise;Patient/family education;DME instruction;Gait training;Balance training;Functional mobility training    PT Goals (Current goals can be found in the Care Plan section)  Acute Rehab PT Goals Patient Stated Goal: to go home PT Goal Formulation: With patient Time For Goal Achievement: 07/17/21 Potential to Achieve Goals: Fair    Frequency Min 3X/week   Barriers to discharge Decreased caregiver support      Co-evaluation               AM-PAC PT "6 Clicks" Mobility  Outcome Measure Help needed turning from your back to your side while in a flat bed without using bedrails?: A Lot Help needed moving from lying on your back to sitting on the side of a flat bed without using bedrails?: Total Help needed moving to and from a bed to a chair (including a wheelchair)?: Total Help needed standing up from a chair using your arms (e.g., wheelchair or bedside chair)?: Total Help needed to walk in hospital room?: Total Help needed climbing 3-5 steps with a railing? : Total 6 Click Score: 7    End of Session   Activity Tolerance: Patient limited by fatigue Patient left: in bed;with call bell/phone within reach   PT Visit Diagnosis: Muscle weakness (generalized) (M62.81);History of falling (Z91.81)    Time: 9381-8299 PT Time Calculation (min) (ACUTE ONLY): 19 min   Charges:   PT Evaluation $PT Eval Moderate Complexity: 1 Mod          Cyndi Titania Gault, PT Acute Rehabilitation Services BZJIR:[phone] Office:[phone] 07/03/2021   Reginia Naas 07/03/2021, 5:12 PM

## 2021-07-03 NOTE — Progress Notes (Signed)
Patient called out for assistance to use bathroom. Instructed pt that he wound need to use bed pan at which point pt became irate and ripped out and IV and began bleeding. Patient attempted to stand despite being reminded he was too weak. Fell back onto bed with exhaustion. Pt belligerent, cursing at staff. IV site dressed. Pt called security services from his personal cell phone. Reiterated to patient that his safety was priority. Will continue to monitor.

## 2021-07-03 NOTE — Progress Notes (Signed)
NAME:  Thomas Robinson, MRN:  [id number], DOB:  1961/06/04, LOS: 3 ADMISSION DATE:  06/29/2021, CONSULTATION DATE:  06/29/2021 REFERRING MD:  [facility], CHIEF COMPLAINT:  weakness, hypoxia   History of Present Illness:  Thomas Robinson is a 60 year old male with PMHx of chronic diastolic heart failure, CVA, chronic low back pain, ESRD on HD MWF, hyperpilidemia, iron deficiency anemia, restless leg syndrome, thoracic ascending aortic aneurysm, chronic hypoxic resp failure on 2.5L O2 at baseline, hypertension who initially presented to [facility] with generalized weakness and hypoxia to the 70's on room air. Patient also endorsed severe extertional dyspnea associated with chest discomfort and tighthness and abdominal discomfort, fatigue and weakness and nonproductive cough and diffuse pain with intermittent delirium at home.   In the ED, patient required BiPAP intermittently for hypoxia but was able to saturate well on 5L O2. CTA Chest obtained that was negative for pulmonary embolism but did demonstrate moderate right sided plueral effusion, diffuse ground glass opacities with smooth interlobular septal thickening; RLL consolidation with hypodensity measure 4x5cm with small pericardial effusion.   Pertinent  Medical History   Past Medical History:  Diagnosis Date   Anaphylactic shock, unspecified, sequela 06/10/2019   Aortic stenosis    mild-moderate AS 12/2020 echo   Arthritis    Asthma, chronic, unspecified asthma severity, with acute exacerbation 10/23/2017   CAP (community acquired pneumonia) 10/23/2017   Carpal tunnel syndrome of right wrist 10/05/2018   Cataract    right - removed by surgery   Cerebral infarction due to thrombosis of cerebral artery (HCC)    Cervical disc herniation 01/04/2020   Chronic diastolic heart failure ([place]) 04/13/2018   Chronic low back pain 11/24/2019   Constipation    Cough    chronic cough   Dyspnea    Encounter for immunization 07/08/2017   ESRD on hemodialysis  ([place]) 02/16/2018   Fall 06/04/2019   GERD (gastroesophageal reflux disease) 06/04/2019   GI bleeding 05/24/2017   Gram-negative sepsis, unspecified ([place]) 09/05/2017   History of fusion of cervical spine 03/26/2020   Hyperlipidemia    Hypertension    Hypokalemia 06/04/2017   Iron deficiency anemia, unspecified 09/09/2017   Left shoulder pain 11/11/2019   Lumbar radiculopathy 11/24/2019   Lung contusion 06/04/2019   LVH (left ventricular hypertrophy) due to hypertensive disease, with heart failure ([place]) 06/18/2020   Macrocytic anemia 05/24/2017   Moderate protein-calorie malnutrition ([place]) 06/06/2017   Myofascial pain syndrome 06/12/2020   Neuritis of right ulnar nerve 09/13/2018   Non-compliance with renal dialysis ([place]) 02/08/2020   Oxygen deficiency 12/30/2019   O2 sats on RA 87% at PAT appt    Pain in joint of right elbow 09/13/2018   Pulmonary hypertension ([place])    Renovascular hypertension 06/22/2020   Restless leg syndrome    Rib fractures 06/2019   Right   S/P cardiac cath 08/27/2020   normal coronary arteries.   Secondary hyperparathyroidism of renal origin ([place]) 06/04/2017   Thoracic ascending aortic aneurysm    4.5 cm 06/04/19 CT   Ulnar neuropathy 10/05/2018   Volume overload 10/23/2017     Significant Hospital Events: Including procedures, antibiotic start and stop dates in addition to other pertinent events   10/29> admitted to ICU, started on linezolid, zosyn and cefepime 10/31> continued on Zosyn for RLL pneumonia; requires pressor support; multiple hypoglycemic episodes for which patient given D50 and started on D10 gtt; STAT EEG neg for seizure activity; CRRT overnight 11/1> D10 gtt discontinued; s/p thoracentesis  with 1.5L clear yellow pleural fluid obtained  Interim History / Subjective:  Patient reports feeling weak overall; no acute concerns at this time. Continued on levophed  Currently saturating well on 4L O2  Objective   Blood pressure (!)  100/11, pulse (!) 115, temperature 98 F (36.7 C), temperature source Oral, resp. rate 18, weight 59 kg, SpO2 96 %.    Intake/Output Summary (Last 24 hours) at 07/03/2021 0705 Last data filed at 07/03/2021 0600 Gross per 24 hour  Intake 1275.22 ml  Output 2312 ml  Net -1036.78 ml   Filed Weights   07/01/21 0433 07/01/21 1300 07/02/21 0500  Weight: 58.2 kg 59.2 kg 59 kg    Examination: General: chronically ill appearing male, resting comfortably in bed HENT: [place]/AT, EOMI Lungs: diffusely coarse breath sounds; on 3-4L [place] Cardiovascular: RRR, S1 and S2 present, +systolic murmur, no rubs/gallops, distal pulses intact  Abdomen: soft, nondistended, nontender +BS Extremities: warm and dry; no peripheral edema noted Neuro: alert/oriented x4; following commands appropriately Skin: no rash noted  Resolved Hospital Problem list    Assessment & Plan:  Acute on chronic hypoxic respiratory failure 2/2 right sided pleural effusion and RLL consolidation - improving Septic shock requiring vasopressor Patient presented with worsening respiratory status for one week duration. He was initially started on linezolid, zosyn and cefepime. He is on zosyn for RLL consolidation. Continues to require levophed for BP maintenance; will start midodrine to wean off vasopressors.  S/p thoracentesis with 1.5L clear pleural fluid; transudative; gram stain without any organisms noted; suspect may be in setting of renal failure vs heart failure.  - Urine Legionella and strep pneumo pending  - Pulmicort bid - Continue Zosyn for RLL consolidation - Start midodrine 10mg  tid - F/u pleural fluid cytology and culture - Continue oxygen support for goal SpO2 >88%  Acute liver injury Patient noted to have acutely elevated AST/ALT with elevated LDH. No evidence of hemolysis or blood loss. INR slightly elevated. Unclear etiology at this time but will check CK for possible rhabdomyolysis. Could also be in setting of congestive  hepatopathy from diastolic heart failure although less likely with acute elevation - F/u CK - Repeat CMP this PM; if remains elevated, will evaluate with RUQ [place]  ESRD on HD Patient with hx of ESRD on HD with intermittent compliance. Does not appear to be encephalopathic or hypervolemic at this time or requiring emergent HD. - Nephrology consulted - HD per nephrology  Possible adrenal crisis - improved  Hypoglycemia, hypothermia - resolved  Patient received three doses of SoluCortef; currently normothermic and hypoglycemia has resolved. Currently off D10 gtt and maintaining CBG's > 100 - Will discontinue solucortef at this time  -CBG monitoring   Macrocytic anemia:  Anemia of chronic disease 2/2 renal failure: Appears to be chronic and stable. Previous vitamin B12 and folate levels wnl.  - Continue to monitor Hb  - Continue auryxia - Transfuse if  Hb <7  Best Practice (right click and "Reselect all SmartList Selections" daily)   Diet/type: Regular consistency (see orders) DVT prophylaxis: systemic heparin GI prophylaxis: PPI Lines: Arterial Line Foley:  Yes, and it is still needed Code Status:  full code Last date of multidisciplinary goals of care discussion [11/1- patient updated ]  Labs   CBC: Recent Labs  Lab 06/29/21 2237 06/30/21 0658 07/01/21 1329 07/01/21 1607 07/02/21 0420 07/03/21 0412  WBC 4.4 4.3 7.4  --  7.2 7.4  NEUTROABS 3.6  --   --   --   --   --  HGB 9.8* 8.7* 10.4* 9.9* 9.1* 9.1*  HCT 30.5* 27.4* 33.1* 29.0* 28.1* 28.9*  MCV 104.1* 104.2* 104.4*  --  101.8* 101.8*  PLT 181 131* 130*  --  122* 99*    Basic Metabolic Panel: Recent Labs  Lab 06/30/21 0658 07/01/21 1329 07/01/21 1607 07/02/21 0420 07/02/21 1600 07/03/21 0409  NA 137 137 131* 129* 131* 129*  K 4.2 5.2* 5.0 4.7 4.8 5.3*  CL 98 94*  --  93* 97* 96*  CO2 24 23  --  17* 21* 18*  GLUCOSE 90 88  --  119* 158* 120*  BUN 61* 68*  --  62* 39* 45*  CREATININE 7.63* 9.22*  --  7.90*  5.52* 6.46*  CALCIUM 9.2 8.9  --  8.0* 8.3* 8.4*  MG 2.4  --   --  2.6*  --   --   PHOS  --   --   --  8.1* 5.3*  --    GFR: Estimated Creatinine Clearance: 9.9 mL/min (A) (by C-G formula based on SCr of 6.46 mg/dL (H)). Recent Labs  Lab 06/30/21 0250 06/30/21 0658 07/01/21 0255 07/01/21 1329 07/01/21 1335 07/02/21 0420 07/03/21 0412  PROCALCITON  --  0.96 1.36  --   --  6.55  --   WBC  --  4.3  --  7.4  --  7.2 7.4  LATICACIDVEN 0.7 0.8  --   --  2.6*  --   --     Liver Function Tests: Recent Labs  Lab 06/29/21 2237 07/01/21 1329 07/02/21 0420 07/02/21 1526 07/02/21 1600 07/03/21 0409  AST 61* 74*  --   --   --  2,105*  ALT 6 29  --   --   --  1,405*  ALKPHOS 80 73  --   --   --  78  BILITOT 1.4* 1.2  --   --   --  1.1  PROT 7.4 6.3*  --  5.8*  --  5.7*  ALBUMIN 3.3* 2.7* 2.3*  --  2.3* 2.3*   Recent Labs  Lab 07/01/21 1329  LIPASE 44  AMYLASE 93   No results for input(s): AMMONIA in the last 168 hours.  ABG    Component Value Date/Time   PHART 7.296 (L) 07/01/2021 1607   PCO2ART 36.9 07/01/2021 1607   PO2ART 100 07/01/2021 1607   HCO3 18.1 (L) 07/01/2021 1607   TCO2 19 (L) 07/01/2021 1607   ACIDBASEDEF 8.0 (H) 07/01/2021 1607   O2SAT 97.0 07/01/2021 1607     Coagulation Profile: Recent Labs  Lab 06/30/21 0258 07/01/21 1329 07/03/21 0412  INR 1.3* 1.6* 2.1*    Cardiac Enzymes: No results for input(s): CKTOTAL, CKMB, CKMBINDEX, TROPONINI in the last 168 hours.  HbA1C: Hgb A1c MFr Bld  Date/Time Value Ref Range Status  07/01/2021 01:31 PM 4.6 (L) 4.8 - 5.6 % Final    Comment:    (NOTE) Pre diabetes:          5.7%-6.4%  Diabetes:              >6.4%  Glycemic control for   <7.0% adults with diabetes   01/04/2020 05:49 PM 4.9 4.8 - 5.6 % Final    Comment:    (NOTE) Pre diabetes:          5.7%-6.4% Diabetes:              >6.4% Glycemic control for   <7.0% adults with diabetes     CBG:  Recent Labs  Lab 07/02/21 1546  07/02/21 1757 07/02/21 1917 07/02/21 2325 07/03/21 0402  GLUCAP 145* 116* 114* 111* 119*     Critical care time: 35 minutes    Harvie Heck, MD Internal Medicine, PGY-3 07/03/21 7:05 AM Pager # [phone]

## 2021-07-03 NOTE — Plan of Care (Signed)

## 2021-07-04 ENCOUNTER — Inpatient Hospital Stay (HOSPITAL_COMMUNITY): Payer: Medicare Other

## 2021-07-04 DIAGNOSIS — I5032 Chronic diastolic (congestive) heart failure: Secondary | ICD-10-CM

## 2021-07-04 DIAGNOSIS — I4892 Unspecified atrial flutter: Secondary | ICD-10-CM

## 2021-07-04 LAB — COMPREHENSIVE METABOLIC PANEL
ALT: 1130 U/L — ABNORMAL HIGH (ref 0–44)
AST: 738 U/L — ABNORMAL HIGH (ref 15–41)
Albumin: 2.2 g/dL — ABNORMAL LOW (ref 3.5–5.0)
Alkaline Phosphatase: 85 U/L (ref 38–126)
Anion gap: 15 (ref 5–15)
BUN: 58 mg/dL — ABNORMAL HIGH (ref 8–23)
CO2: 19 mmol/L — ABNORMAL LOW (ref 22–32)
Calcium: 8.5 mg/dL — ABNORMAL LOW (ref 8.9–10.3)
Chloride: 99 mmol/L (ref 98–111)
Creatinine, Ser: 7.22 mg/dL — ABNORMAL HIGH (ref 0.61–1.24)
GFR, Estimated: 8 mL/min — ABNORMAL LOW (ref >60)
Glucose, Bld: 123 mg/dL — ABNORMAL HIGH (ref 70–99)
Potassium: 5.5 mmol/L — ABNORMAL HIGH (ref 3.5–5.1)
Sodium: 133 mmol/L — ABNORMAL LOW (ref 135–145)
Total Bilirubin: 1.2 mg/dL (ref 0.3–1.2)
Total Protein: 5.5 g/dL — ABNORMAL LOW (ref 6.5–8.1)

## 2021-07-04 LAB — CBC
HCT: 29.6 % — ABNORMAL LOW (ref 39.0–52.0)
Hemoglobin: 9.9 g/dL — ABNORMAL LOW (ref 13.0–17.0)
MCH: 32.9 pg (ref 26.0–34.0)
MCHC: 33.4 g/dL (ref 30.0–36.0)
MCV: 98.3 fL (ref 80.0–100.0)
Platelets: 125 10*3/uL — ABNORMAL LOW (ref 150–400)
RBC: 3.01 MIL/uL — ABNORMAL LOW (ref 4.22–5.81)
RDW: 17.2 % — ABNORMAL HIGH (ref 11.5–15.5)
WBC: 9.6 10*3/uL (ref 4.0–10.5)
nRBC: 1.7 % — ABNORMAL HIGH (ref 0.0–0.2)

## 2021-07-04 LAB — PROTIME-INR
INR: 1.5 — ABNORMAL HIGH (ref 0.8–1.2)
Prothrombin Time: 18.3 seconds — ABNORMAL HIGH (ref 11.4–15.2)

## 2021-07-04 LAB — TSH: TSH: 6.693 u[IU]/mL — ABNORMAL HIGH (ref 0.350–4.500)

## 2021-07-04 LAB — GLUCOSE, CAPILLARY
Glucose-Capillary: 103 mg/dL — ABNORMAL HIGH (ref 70–99)
Glucose-Capillary: 111 mg/dL — ABNORMAL HIGH (ref 70–99)
Glucose-Capillary: 118 mg/dL — ABNORMAL HIGH (ref 70–99)
Glucose-Capillary: 134 mg/dL — ABNORMAL HIGH (ref 70–99)
Glucose-Capillary: 137 mg/dL — ABNORMAL HIGH (ref 70–99)
Glucose-Capillary: 153 mg/dL — ABNORMAL HIGH (ref 70–99)
Glucose-Capillary: 85 mg/dL (ref 70–99)

## 2021-07-04 LAB — T4, FREE: Free T4: 0.92 ng/dL (ref 0.61–1.12)

## 2021-07-04 LAB — CYTOLOGY - NON PAP

## 2021-07-04 MED ORDER — APIXABAN 2.5 MG PO TABS
2.5000 mg | ORAL_TABLET | Freq: Two times a day (BID) | ORAL | Status: DC
  Administered 2021-07-04 – 2021-07-05 (×2): 2.5 mg via ORAL
  Filled 2021-07-04 (×2): qty 1

## 2021-07-04 MED ORDER — HEPARIN SODIUM (PORCINE) 1000 UNIT/ML DIALYSIS
2000.0000 [IU] | Freq: Once | INTRAMUSCULAR | Status: DC
  Filled 2021-07-04: qty 2

## 2021-07-04 MED ORDER — DARBEPOETIN ALFA 200 MCG/0.4ML IJ SOSY
200.0000 ug | PREFILLED_SYRINGE | Freq: Once | INTRAMUSCULAR | Status: AC
  Administered 2021-07-04: 200 ug via INTRAVENOUS
  Filled 2021-07-04 (×2): qty 0.4

## 2021-07-04 MED ORDER — METOPROLOL TARTRATE 12.5 MG HALF TABLET
12.5000 mg | ORAL_TABLET | Freq: Two times a day (BID) | ORAL | Status: DC
  Administered 2021-07-04 – 2021-07-05 (×2): 12.5 mg via ORAL
  Filled 2021-07-04 (×2): qty 1

## 2021-07-04 MED ORDER — ALBUTEROL SULFATE (2.5 MG/3ML) 0.083% IN NEBU
2.5000 mg | INHALATION_SOLUTION | Freq: Two times a day (BID) | RESPIRATORY_TRACT | Status: DC
  Administered 2021-07-04 – 2021-07-05 (×2): 2.5 mg via RESPIRATORY_TRACT
  Filled 2021-07-04 (×2): qty 3

## 2021-07-04 NOTE — Progress Notes (Addendum)
PROGRESS NOTE    Thomas Robinson  ZOX:[id number] DOB: [birth year] DOA: 06/29/2021 PCP: [name], PA   Chief Complaint  Patient presents with   Shortness of Breath   Weakness   Brief Narrative/Hospital Course:  Thomas Robinson, 60 y.o. male with PMH of chronic hypoxic respiratory failure on 2.5l [place] , chronic diastolic congestive heart failure and ESRD on HD, chronic low back pain, HLD, IDA, RLS, thoracic ascending aortic aneurysm, hypertension who presented with worsening shortness of breath noted to be hypoxic in 70s on room air, with generalized weakness, severe exertional dyspnea with chest discomfort tightness , found to have right-sided pleural effusion with right lower lobe pneumonia, small pericardial effusion, no PE on CTA. He is s/p thoracentesis with transudative fluids.  Was managed in ICU initiated broad-spectrum antibiotics, vasopressors.  Also with hypoglycemia given D10.  Stabilized in ICU, oxygen weaned down to4L O2. Continued on IV zosyn.  Also has elevated LFTs.  Nephro was consulted for dialysis.  She was transferred to telemetry service 11/3.    Subjective: Seen this morning.  Reports his breathing is better and weakness is improving.   Overnight afebrile on 3 L nasal cannula heart rate has been high 110-120s K high at 5.5  Assessment & Plan:  Acute on chronic hypoxic respiratory failure due to pleural effusion and pneumonia Right-sided transudative pleural effusion s/p thoracentesis-likely fluid overload Right lower lobe pneumonia: Pleural fluid transudative initially needing pressor antibiotics pressure support with Levophed. On empiric Zosyn for now monitor follow-up. Blood cx 10/30- NGTD, Pleural fluid culture no growth. Continue midodrine with monitoring of his heart rate and blood pressure. Recent Labs  Lab 06/30/21 0250 06/30/21 0658 07/01/21 0255 07/01/21 1329 07/01/21 1335 07/02/21 0420 07/03/21 0412 07/04/21 0249  WBC  --  4.3  --  7.4  --   7.2 7.4 9.6  LATICACIDVEN 0.7 0.8  --   --  2.6*  --   --   --   PROCALCITON  --  0.96 1.36  --   --  6.55  --   --     Septic shock due to pneumonia: Needing pressor support in ICU.  Resolved.  On midodrine  Sinus tachycardia heart rate in 110s 120s.  Needing midodrine.  Check TSH.  Echo limited EF 70 to 75%, no R WMA severe concentric LVH.  Acute liver injury likely from right-sided heart failure/sepsis Liver dysfunction Coagulopathy with elevated INR 2.1 suspect from liver dysfunction: LFT  ast/alt/alp 2100/14-5/78> level is downtrending/ repeat inr.monitor LFTs follow-up ultrasound pending.  Recent Labs  Lab 06/29/21 2237 06/30/21 0258 07/01/21 1329 07/02/21 0420 07/02/21 1526 07/02/21 1600 07/03/21 0409 07/03/21 0412 07/03/21 1331 07/04/21 0249  AST 61*  --  74*  --   --   --  2,105*  --  1,389* 738*  ALT 6  --  29  --   --   --  1,405*  --  1,296* 1,130*  ALKPHOS 80  --  73  --   --   --  78  --  79 85  BILITOT 1.4*  --  1.2  --   --   --  1.1  --  1.2 1.2  PROT 7.4  --  6.3*  --  5.8*  --  5.7*  --  5.4* 5.5*  ALBUMIN 3.3*  --  2.7* 2.3*  --  2.3* 2.3*  --  2.2* 2.2*  INR  --  1.3* 1.6*  --   --   --   --  2.1*  --   --     ESRD and HD MWF: Nephrology managing for dialysis today Anemia of chronic renal disease: Stable.  B12 and folate. Stable 9-79 gm  Metabolic bone disease: Continue Renvela and Sensipar.  Possible adrenal crisis resolved  Hypoglycemia/hypothermia needing D10-could be due to liver dysfunction Patient on D10 ,Solu-Cortef and has been discontinued.  Currently n.p.o. for ultrasound. Recent Labs  Lab 07/03/21 1517 07/03/21 1925 07/04/21 0014 07/04/21 0411 07/04/21 0739  GLUCAP 132* 135* 134* 111* 118*    BMI borderline at 19.  Encourage oral intake Pressure injury of coccyx stage II POA as below Pressure Injury 07/01/21 Coccyx Mid Stage 2 -  Partial thickness loss of dermis presenting as a shallow open injury with a red, pink wound bed without  slough. (Active)  07/01/21 1300  Location: Coccyx  Location Orientation: Mid  Staging: Stage 2 -  Partial thickness loss of dermis presenting as a shallow open injury with a red, pink wound bed without slough.  Wound Description (Comments):   Present on Admission: Yes    DVT prophylaxis: heparin injection 5,000 Units Start: 06/30/21 0600 SCDs Start: 06/30/21 0405 Code Status:   Code Status: Full Code Family Communication: plan of care discussed with patient at bedside. Status is: Inpatient  Remains inpatient appropriate because: For ongoing management of his respiratory failure Patient is currently not medically stable for discharge Anticipate discharge in next 2 to 3 days  Objective: Vitals last 24 hrs: Vitals:   07/04/21 0400 07/04/21 0425 07/04/21 0755 07/04/21 0939  BP:  118/76  138/85  Pulse:  (!) 122    Resp:  18    Temp:  97.9 F (36.6 C)    TempSrc:  Oral    SpO2: 98% 96% 98%   Weight:       Weight change:   Intake/Output Summary (Last 24 hours) at 07/04/2021 1120 Last data filed at 07/03/2021 1800 Gross per 24 hour  Intake 170 ml  Output --  Net 170 ml   Net IO Since Admission: 2,465.15 mL [07/04/21 1120]   Physical Examination: General exam: Aapx3, weak,older than stated age. HEENT:Oral mucosa moist, Ear/Nose WNL grossly,dentition normal. Respiratory system: B/l diminished BS, no use of accessory muscle, non tender. Cardiovascular system: S1 & S2 +,No JVD. Gastrointestinal system: Abdomen soft, NT,ND, BS+. Nervous System:Alert, awake, moving extremities. Extremities: edema none, distal peripheral pulses palpable.  Skin: No rashes, no icterus. MSK: Normal muscle bulk, tone, power.  Medications reviewed:  Scheduled Meds:  albuterol  2.5 mg Nebulization BID   budesonide (PULMICORT) nebulizer solution  0.25 mg Nebulization BID   Chlorhexidine Gluconate Cloth  6 each Topical Q0600   cinacalcet  60 mg Oral QPM   darbepoetin (ARANESP) injection - DIALYSIS   200 mcg Intravenous Once in dialysis   feeding supplement (NEPRO CARB STEADY)  237 mL Oral TID BM   ferric citrate  420 mg Oral TID WC   [START ON 07/05/2021] heparin  2,000 Units Dialysis Once in dialysis   heparin  5,000 Units Subcutaneous Q8H   mouth rinse  15 mL Mouth Rinse BID   melatonin  3 mg Oral QHS   midodrine  10 mg Oral TID WC   multivitamin  1 tablet Oral Daily   pantoprazole (PROTONIX) IV  40 mg Intravenous QHS   sevelamer carbonate  1,600 mg Oral TID WC   sodium chloride flush  10-40 mL Intracatheter Q12H   Continuous Infusions:  sodium chloride  dextrose 100 mL/hr at 07/04/21 0705   norepinephrine (LEVOPHED) Adult infusion Stopped (07/03/21 0825)   ondansetron (ZOFRAN) IV Stopped (07/02/21 1847)   piperacillin-tazobactam (ZOSYN)  IV Stopped (07/04/21 0954)   Pressure Injury 07/01/21 Coccyx Mid Stage 2 -  Partial thickness loss of dermis presenting as a shallow open injury with a red, pink wound bed without slough. (Active)  07/01/21 1300  Location: Coccyx  Location Orientation: Mid  Staging: Stage 2 -  Partial thickness loss of dermis presenting as a shallow open injury with a red, pink wound bed without slough.  Wound Description (Comments):   Present on Admission: Yes    Diet Order             Diet NPO time specified  Diet effective midnight                          Weight change:   Wt Readings from Last 3 Encounters:  07/02/21 59 kg  05/31/21 59.4 kg  05/19/21 58.7 kg     Consultants:see note  Procedures:see note Antimicrobials: Anti-infectives (From admission, onward)    Start     Dose/Rate Route Frequency Ordered Stop   07/03/21 0100  piperacillin-tazobactam (ZOSYN) IVPB 2.25 g        2.25 g 100 mL/hr over 30 Minutes Intravenous Every 8 hours 07/02/21 2204     07/01/21 2300  piperacillin-tazobactam (ZOSYN) IVPB 3.375 g  Status:  Discontinued        3.375 g 100 mL/hr over 30 Minutes Intravenous Every 6 hours 07/01/21 1712 07/02/21  2204   06/30/21 1000  linezolid (ZYVOX) IVPB 600 mg  Status:  Discontinued        600 mg 300 mL/hr over 60 Minutes Intravenous Every 12 hours 06/30/21 0410 07/01/21 1034   06/30/21 0600  piperacillin-tazobactam (ZOSYN) IVPB 2.25 g  Status:  Discontinued        2.25 g 100 mL/hr over 30 Minutes Intravenous Every 8 hours 06/30/21 0422 07/01/21 1712   06/30/21 0230  ceFEPIme (MAXIPIME) 2 g in sodium chloride 0.9 % 100 mL IVPB        2 g 200 mL/hr over 30 Minutes Intravenous  Once 06/30/21 0216 06/30/21 0352      Culture/Microbiology    Component Value Date/Time   SDES FLUID PLEURAL 07/02/2021 1518   [place] 07/02/2021 1518   CULT  07/02/2021 1518    NO GROWTH 2 DAYS Performed at [facility] [address] [place], [place] [address]    REPTSTATUS PENDING 07/02/2021 1518    Other culture-see note  Unresulted Labs (From admission, onward)     Start     Ordered   07/03/21 0500  CBC  Daily,   R     Question:  Specimen collection method  Answer:  Unit=Unit collect   07/02/21 1416   07/01/21 1021  Legionella Pneumophila Serogp 1 Ur Ag  Once,   R        07/01/21 1034   07/01/21 1021  Urine rapid drug screen (hosp performed)  ONCE - STAT,   STAT        07/01/21 1034   07/01/21 1021  Strep pneumoniae urinary antigen  Once,   R        07/01/21 1034   06/30/21 0407  Culture, Respiratory w Gram Stain (tracheal aspirate)  Once,   R       Question:  Patient immune status  Answer:  Normal   06/30/21 0410   06/30/21 0407  Urinalysis, Routine w reflex microscopic Urine, Clean Catch  Once,   R        06/30/21 0410   06/29/21 2239  Urinalysis, Routine w reflex microscopic  Once,   R        06/29/21 2238          Data Reviewed: I have personally reviewed following labs and imaging studies CBC: Recent Labs  Lab 06/29/21 2237 06/30/21 0658 07/01/21 1329 07/01/21 1607 07/02/21 0420 07/03/21 0412 07/04/21 0249  WBC 4.4 4.3 7.4  --  7.2 7.4 9.6  NEUTROABS 3.6  --    --   --   --   --   --   HGB 9.8* 8.7* 10.4* 9.9* 9.1* 9.1* 9.9*  HCT 30.5* 27.4* 33.1* 29.0* 28.1* 28.9* 29.6*  MCV 104.1* 104.2* 104.4*  --  101.8* 101.8* 98.3  PLT 181 131* 130*  --  122* 99* 127*   Basic Metabolic Panel: Recent Labs  Lab 06/30/21 0658 07/01/21 1329 07/02/21 0420 07/02/21 1600 07/03/21 0409 07/03/21 1331 07/04/21 0249  NA 137   < > 129* 131* 129* 131* 133*  K 4.2   < > 4.7 4.8 5.3* 5.4* 5.5*  CL 98   < > 93* 97* 96* 98 99  CO2 24   < > 17* 21* 18* 19* 19*  GLUCOSE 90   < > 119* 158* 120* 147* 123*  BUN 61*   < > 62* 39* 45* 51* 58*  CREATININE 7.63*   < > 7.90* 5.52* 6.46* 6.78* 7.22*  CALCIUM 9.2   < > 8.0* 8.3* 8.4* 8.3* 8.5*  MG 2.4  --  2.6*  --   --   --   --   PHOS  --   --  8.1* 5.3*  --   --   --    < > = values in this interval not displayed.   GFR: Estimated Creatinine Clearance: 8.9 mL/min (A) (by C-G formula based on SCr of 7.22 mg/dL (H)). Liver Function Tests: Recent Labs  Lab 06/29/21 2237 07/01/21 1329 07/02/21 0420 07/02/21 1526 07/02/21 1600 07/03/21 0409 07/03/21 1331 07/04/21 0249  AST 61* 74*  --   --   --  2,105* 1,389* 738*  ALT 6 29  --   --   --  1,405* 1,296* 1,130*  ALKPHOS 80 73  --   --   --  78 79 85  BILITOT 1.4* 1.2  --   --   --  1.1 1.2 1.2  PROT 7.4 6.3*  --  5.8*  --  5.7* 5.4* 5.5*  ALBUMIN 3.3* 2.7* 2.3*  --  2.3* 2.3* 2.2* 2.2*   Recent Labs  Lab 07/01/21 1329  LIPASE 44  AMYLASE 93   No results for input(s): AMMONIA in the last 168 hours. Coagulation Profile: Recent Labs  Lab 06/30/21 0258 07/01/21 1329 07/03/21 0412  INR 1.3* 1.6* 2.1*   Cardiac Enzymes: Recent Labs  Lab 07/03/21 1031  CKTOTAL 30*   BNP (last 3 results) No results for input(s): PROBNP in the last 8760 hours. HbA1C: Recent Labs    07/01/21 1331  HGBA1C 4.6*   CBG: Recent Labs  Lab 07/03/21 1517 07/03/21 1925 07/04/21 0014 07/04/21 0411 07/04/21 0739  GLUCAP 132* 135* 134* 111* 118*   Lipid Profile: No  results for input(s): CHOL, HDL, LDLCALC, TRIG, CHOLHDL, LDLDIRECT in the last 72 hours. Thyroid Function Tests: No results  for input(s): TSH, T4TOTAL, FREET4, T3FREE, THYROIDAB in the last 72 hours. Anemia Panel: No results for input(s): VITAMINB12, FOLATE, FERRITIN, TIBC, IRON, RETICCTPCT in the last 72 hours. Sepsis Labs: Recent Labs  Lab 06/30/21 0250 06/30/21 0658 07/01/21 0255 07/01/21 1335 07/02/21 0420  PROCALCITON  --  0.96 1.36  --  6.55  LATICACIDVEN 0.7 0.8  --  2.6*  --     Recent Results (from the past 240 hour(s))  Resp Panel by RT-PCR (Flu A&B, Covid) Nasopharyngeal Swab     Status: None   Collection Time: 06/29/21  9:56 PM   Specimen: Nasopharyngeal Swab; Nasopharyngeal(NP) swabs in vial transport medium  Result Value Ref Range Status   SARS Coronavirus 2 by RT PCR NEGATIVE NEGATIVE Final    Comment: (NOTE) SARS-CoV-2 target nucleic acids are NOT DETECTED.  The SARS-CoV-2 RNA is generally detectable in upper respiratory specimens during the acute phase of infection. The lowest concentration of SARS-CoV-2 viral copies this assay can detect is 138 copies/mL. A negative result does not preclude SARS-Cov-2 infection and should not be used as the sole basis for treatment or other patient management decisions. A negative result may occur with  improper specimen collection/handling, submission of specimen other than nasopharyngeal swab, presence of viral mutation(s) within the areas targeted by this assay, and inadequate number of viral copies(<138 copies/mL). A negative result must be combined with clinical observations, patient history, and epidemiological information. The expected result is Negative.  Fact Sheet for Patients:  EntrepreneurPulse.com.au  Fact Sheet for Healthcare Providers:  IncredibleEmployment.be  This test is no t yet approved or cleared by the [place] FDA and  has been authorized for detection  and/or diagnosis of SARS-CoV-2 by FDA under an Emergency Use Authorization (EUA). This EUA will remain  in effect (meaning this test can be used) for the duration of the COVID-19 declaration under Section 564(b)(1) of the Act, 21 U.S.C.section 360bbb-3(b)(1), unless the authorization is terminated  or revoked sooner.       Influenza A by PCR NEGATIVE NEGATIVE Final   Influenza B by PCR NEGATIVE NEGATIVE Final    Comment: (NOTE) The Xpert Xpress SARS-CoV-2/FLU/RSV plus assay is intended as an aid in the diagnosis of influenza from Nasopharyngeal swab specimens and should not be used as a sole basis for treatment. Nasal washings and aspirates are unacceptable for Xpert Xpress SARS-CoV-2/FLU/RSV testing.  Fact Sheet for Patients: EntrepreneurPulse.com.au  Fact Sheet for Healthcare Providers: IncredibleEmployment.be  This test is not yet approved or cleared by the [place] FDA and has been authorized for detection and/or diagnosis of SARS-CoV-2 by FDA under an Emergency Use Authorization (EUA). This EUA will remain in effect (meaning this test can be used) for the duration of the COVID-19 declaration under Section 564(b)(1) of the Act, 21 U.S.C. section 360bbb-3(b)(1), unless the authorization is terminated or revoked.  Performed at [facility], [place] [address] [place], [place] [address]   Blood culture (routine x 2)     Status: None (Preliminary result)   Collection Time: 06/30/21  3:52 AM   Specimen: BLOOD  Result Value Ref Range Status   Specimen Description   Final    BLOOD BLOOD LEFT FOREARM Performed at [facility] [address] [place], [place] [address]    Special Requests   Final    BOTTLES DRAWN AEROBIC AND ANAEROBIC Blood Culture adequate volume Performed at [facility] [address], [place], [place] [address]    Culture  Final    NO GROWTH 4  DAYS Performed at [facility], [place] [address] [place], [place] [address]    Report Status PENDING  Incomplete  MRSA Next Gen by PCR, Nasal     Status: None   Collection Time: 06/30/21  5:17 AM   Specimen: Nasal Mucosa; Nasal Swab  Result Value Ref Range Status   MRSA by PCR Next Gen NOT DETECTED NOT DETECTED Final    Comment: (NOTE) The GeneXpert MRSA Assay (FDA approved for NASAL specimens only), is one component of a comprehensive MRSA colonization surveillance program. It is not intended to diagnose MRSA infection nor to guide or monitor treatment for MRSA infections. Test performance is not FDA approved in patients less than 87 years old. Performed at [facility], [place] [address], [place], [place] [address]   Blood culture (routine x 2)     Status: None (Preliminary result)   Collection Time: 06/30/21  6:58 AM   Specimen: BLOOD  Result Value Ref Range Status   Specimen Description   Final    BLOOD BLOOD LEFT HAND Performed at [facility] [address], [place], [place] [address]    Special Requests   Final    BOTTLES DRAWN AEROBIC AND ANAEROBIC Blood Culture adequate volume Performed at [facility] [address], [place], [place] [address]    Culture   Final    NO GROWTH 4 DAYS Performed at [facility], [place] [address] [place], [place] [address]    Report Status PENDING  Incomplete  Respiratory (~20 pathogens) panel by PCR     Status: None   Collection Time: 07/01/21 12:01 PM   Specimen: Nasopharyngeal Swab; Respiratory  Result Value Ref Range Status   Adenovirus NOT DETECTED NOT DETECTED Final   Coronavirus 229E NOT DETECTED NOT DETECTED Final    Comment: (NOTE) The Coronavirus on the Respiratory Panel, DOES NOT test for the novel  Coronavirus (2019 nCoV)    Coronavirus HKU1 NOT DETECTED NOT DETECTED Final   Coronavirus NL63 NOT DETECTED NOT DETECTED Final   Coronavirus OC43 NOT DETECTED  NOT DETECTED Final   Metapneumovirus NOT DETECTED NOT DETECTED Final   Rhinovirus / Enterovirus NOT DETECTED NOT DETECTED Final   Influenza A NOT DETECTED NOT DETECTED Final   Influenza B NOT DETECTED NOT DETECTED Final   Parainfluenza Virus 1 NOT DETECTED NOT DETECTED Final   Parainfluenza Virus 2 NOT DETECTED NOT DETECTED Final   Parainfluenza Virus 3 NOT DETECTED NOT DETECTED Final   Parainfluenza Virus 4 NOT DETECTED NOT DETECTED Final   Respiratory Syncytial Virus NOT DETECTED NOT DETECTED Final   Bordetella pertussis NOT DETECTED NOT DETECTED Final   Bordetella Parapertussis NOT DETECTED NOT DETECTED Final   Chlamydophila pneumoniae NOT DETECTED NOT DETECTED Final   Mycoplasma pneumoniae NOT DETECTED NOT DETECTED Final    Comment: Performed at [facility], [place] [address], [place],  [address]  Pleural fluid culture w Gram Stain     Status: None (Preliminary result)   Collection Time: 07/02/21  3:18 PM   Specimen: Pleural Fluid  Result Value Ref Range Status   Specimen Description FLUID PLEURAL  Final   Special Requests NONE  Final   Gram Stain   Final    RARE WBC PRESENT,BOTH PMN AND MONONUCLEAR NO ORGANISMS SEEN    Culture   Final    NO GROWTH 2 DAYS Performed at [facility], [place]  [address] [place], [place] [address]    Report Status PENDING  Incomplete     Radiology Studies: DG CHEST PORT 1 VIEW  Result Date: 07/02/2021 CLINICAL DATA:  Status post right thoracentesis. EXAM: PORTABLE CHEST 1 VIEW COMPARISON:  07/02/2021 FINDINGS: There is a right chest wall dialysis catheter with tips in the cavoatrial junction. Heart size and mediastinal contours are stable. Decreased volume of the right pleural effusion status post thoracentesis. No pneumothorax identified. Atelectasis is identified within the right lung base. Similar appearance of mild bilateral hazy lung opacities throughout both lungs. IMPRESSION: 1. Decrease in volume of right pleural effusion  status post thoracentesis. 2. No pneumothorax. Electronically Signed   By: Kerby Moors M.D.   On: 07/02/2021 15:44   ECHOCARDIOGRAM COMPLETE  Result Date: 07/02/2021    ECHOCARDIOGRAM REPORT   Patient Name:   KENDERICK KOBLER Date of Exam: 07/02/2021 Medical Rec #:  [id number]        Height:       68.0 in Accession #:    [id number]       Weight:       130.1 lb Date of Birth:  10/02/1960        BSA:          1.702 m Patient Age:    62 years         BP:           125/48 mmHg Patient Gender: M                HR:           119 bpm. Exam Location:  Inpatient Procedure: 2D Echo, Cardiac Doppler and Color Doppler Indications:    CHF  History:        Patient has prior history of Echocardiogram examinations, most                 recent 01/13/2021. CHF, Pulmonary HTN, Aortic Valve Disease,                 Signs/Symptoms:Hypotension; Risk Factors:Hypertension. ESRD.  Sonographer:    Merrie Roof RDCS Referring Phys: [phone] [facility]  Sonographer Comments: Global longitudinal strain was attempted. The patient could no longer tolerate the exam. Strain attempted, before told to stop. IMPRESSIONS  1. Strain attempted but patient asked to stop. Would recomm limited echo with strain only.  2. LV cavitiy is small. Turburlent flow through LV. Near caviity obliteration. . Left ventricular ejection fraction, by estimation, is 70 to 75%. The left ventricle has hyperdynamic function. The left ventricle has no regional wall motion abnormalities.  There is severe concentric left ventricular hypertrophy. Left ventricular diastolic function could not be evaluated.  3. Right ventricular systolic function is moderately reduced. The right ventricular size is mildly enlarged. Mildly increased right ventricular wall thickness.  4. Left atrial size was severely dilated.  5. Right atrial size was severely dilated.  6. Mild mitral valve regurgitation. Moderate to severe mitral annular calcification.  7. Tricuspid valve regurgitation is  moderate.  8. AV is thickened with restricted motion. Peak and mean gradients through the valve are 22 and 12 mm hg respectively. Dimensionless gradient is 0.53 consistent with mild AS 2 D imaging suggests that it is moderate. No significant change in gradient from May 2022. Aortic valve regurgitation is mild.  9. Aortic dilatation noted. There is mild dilatation of the ascending aorta, measuring 40 mm. FINDINGS  Left Ventricle: LV cavitiy is small. Turburlent flow through LV.  Near caviity obliteration. Left ventricular ejection fraction, by estimation, is 70 to 75%. The left ventricle has hyperdynamic function. The left ventricle has no regional wall motion abnormalities. The left ventricular internal cavity size was small. There is severe concentric left ventricular hypertrophy. Left ventricular diastolic function could not be evaluated. Right Ventricle: The right ventricular size is mildly enlarged. Mildly increased right ventricular wall thickness. Right ventricular systolic function is moderately reduced. Left Atrium: Left atrial size was severely dilated. Right Atrium: Right atrial size was severely dilated. Pericardium: Trivial pericardial effusion is present. Mitral Valve: There is mild thickening of the mitral valve leaflet(s). Moderate to severe mitral annular calcification. Mild mitral valve regurgitation. Tricuspid Valve: The tricuspid valve is grossly normal. Tricuspid valve regurgitation is moderate. Aortic Valve: AV is thickened with restricted motion. Peak and mean gradients through the valve are 22 and 12 mm hg respectively. Dimensionless gradient is 0.53 consistent with mild AS 2 D imaging suggests that it is moderate. No significant change in gradient from May 2022. Aortic valve regurgitation is mild. Aortic valve mean gradient measures 12.0 mmHg. Aortic valve peak gradient measures 21.7 mmHg. Pulmonic Valve: The pulmonic valve was not well visualized. Pulmonic valve regurgitation is mild. Aorta:  Aortic dilatation noted. There is mild dilatation of the ascending aorta, measuring 40 mm. IAS/Shunts: No atrial level shunt detected by color flow Doppler.  LEFT VENTRICLE PLAX 2D LVIDd:         2.90 cm LVIDs:         1.70 cm LV PW:         1.70 cm LV IVS:        1.70 cm  RIGHT VENTRICLE RV Basal diam:  4.50 cm RV Mid diam:    3.70 cm RV S prime:     11.10 cm/s TAPSE (M-mode): 1.3 cm LEFT ATRIUM              Index        RIGHT ATRIUM           Index LA diam:        3.50 cm  2.06 cm/m   RA Area:     28.60 cm LA Vol (A2C):   139.0 ml 81.66 ml/m  RA Volume:   99.30 ml  58.34 ml/m LA Vol (A4C):   75.6 ml  44.41 ml/m LA Biplane Vol: 103.0 ml 60.51 ml/m  AORTIC VALVE AV Vmax:           233.00 cm/s AV Vmean:          159.000 cm/s AV VTI:            0.304 m AV Peak Grad:      21.7 mmHg AV Mean Grad:      12.0 mmHg LVOT Vmax:         128.00 cm/s LVOT Vmean:        80.800 cm/s LVOT VTI:          0.162 m LVOT/AV VTI ratio: 0.53  AORTA Ao Root diam: 3.50 cm Ao Asc diam:  4.00 cm TRICUSPID VALVE TR Peak grad:   74.6 mmHg TR Vmax:        432.00 cm/s  SHUNTS Systemic VTI: 0.16 m Dorris Carnes MD Electronically signed by Dorris Carnes MD Signature Date/Time: 07/02/2021/7:51:14 PM    Final      LOS: 4 days   Antonieta Pert, MD [facility]  07/04/2021, 11:20 AM

## 2021-07-04 NOTE — NC FL2 (Signed)
[place] LEVEL OF CARE SCREENING TOOL     IDENTIFICATION  Patient Name: Thomas Robinson Birthdate: 24-Aug-1961 Sex: male Admission Date (Current Location): 06/29/2021  [place] and [place] Number:  Kathleen Argue [id number] [place] and Address:  [facility] [place], [place] [address], [place], [place] [address]      Provider Number: [id number]  Attending Physician Name and Address:  Antonieta Pert, MD  Relative Name and Phone Number:  Patrecia Pour [phone]  [phone]    Current Level of Care: Hospital Recommended Level of Care: [place] Prior Approval Number:    Date Approved/Denied:   PASRR Number: [id number]  Discharge Plan: SNF    Current Diagnoses: Patient Active Problem List   Diagnosis Date Noted   Pressure injury of skin 07/02/2021   Abnormal chest CT 06/30/2021   Pleural effusion, right 06/30/2021   SIRS (systemic inflammatory response syndrome) ([place]) 06/30/2021   Acute hypoxemic respiratory failure ([place]) 05/18/2021   Bronchitis 05/18/2021   Preoperative cardiovascular examination 03/06/2021   CHF (congestive heart failure) ([place]) 02/17/2021   Cellulitis of arm, left 02/01/2021   Cellulitis of left lower limb 02/01/2021   Cellulitis, unspecified 02/01/2021   Left arm swelling 01/25/2021   Acute and chronic respiratory failure with hypoxia ([place]) 01/13/2021   Acute on chronic respiratory failure with hypoxia ([place]) 01/12/2021   Concussion with no loss of consciousness 01/12/2021   Laceration without foreign body of left forearm, initial encounter 01/12/2021   Age-related physical debility 09/20/2020   Allergy, unspecified, initial encounter 09/20/2020   Nausea 09/20/2020   Pain, unspecified 09/20/2020   Pruritus, unspecified 09/20/2020   S/P cardiac cath 08/27/2020   Acute encephalopathy 08/26/2020   Peripheral neuropathy 08/26/2020   Elevated troponin 08/25/2020   Hyperkalemia 08/05/2020   Arthritis     Asthma    Cataract    Cerebral infarction due to thrombosis of cerebral artery ([place])    History of CVA (cerebrovascular accident)    Chronic kidney disease    Dyspnea    Hyperlipidemia    Hypertension    Kidney failure    Restless leg syndrome    Degenerative lumbar spinal stenosis 55/73/2202   Diastolic dysfunction 54/27/0623   Renovascular hypertension 06/22/2020   Demand ischemia ([place]) 06/18/2020   LVH (left ventricular hypertrophy) due to hypertensive disease, with heart failure ([place]) 06/18/2020   Myofascial pain syndrome 06/12/2020   Encounter for removal of sutures 03/29/2020   History of fusion of cervical spine 03/26/2020   Non-compliance with renal dialysis ([place]) 02/08/2020   Pulmonary edema 01/23/2020   Cervical disc herniation 01/04/2020   Tachycardia 01/04/2020   SOB (shortness of breath)    Hypoxia 12/30/2019   Chronic low back pain 11/24/2019   Degeneration of lumbar intervertebral disc 11/24/2019   Lumbar radiculopathy 11/24/2019   Left shoulder pain 11/11/2019   Anaphylactic shock, unspecified, sequela 06/10/2019   Rib fractures 06/05/2019   Fall at home, initial encounter 06/04/2019   Right rib fracture 06/04/2019   Lung contusion 06/04/2019   Acute respiratory failure with hypoxia ([place]) 06/04/2019   Anemia in ESRD (end-stage renal disease) ([place]) 06/04/2019   GERD without esophagitis 06/04/2019   Chronic diastolic (congestive) heart failure ([place]) 06/04/2019   Fall 06/04/2019   Thoracic ascending aortic aneurysm 06/04/2019   Acute exacerbation of congestive heart failure ([place]) 05/16/2019   Carpal tunnel syndrome of right wrist 10/05/2018   Ulnar neuropathy 10/05/2018   Neuritis of right ulnar nerve 09/13/2018   Pain in  joint of right elbow 09/13/2018   Chest pain 04/13/2018   Chronic diastolic heart failure ([place]) 04/13/2018   Acute on chronic diastolic CHF (congestive heart failure) ([place]) 04/13/2018   Atypical chest pain 02/16/2018   ESRD on dialysis  ([place]) 02/16/2018   End stage renal disease ([place]) 02/16/2018   CAP (community acquired pneumonia) 10/23/2017   Asthma, chronic, unspecified asthma severity, with acute exacerbation 10/23/2017   Respiratory failure, acute ([place]) 10/23/2017   Pulmonary hypertension ([place]) 10/23/2017   Iron deficiency anemia, unspecified 09/09/2017   Gram-negative sepsis, unspecified ([place]) 09/05/2017   Encounter for immunization 07/08/2017   Moderate protein-calorie malnutrition ([place]) 06/06/2017   Coagulation defect, unspecified ([place]) 06/04/2017   Hypokalemia 06/04/2017   Other hyperlipidemia 06/04/2017   Secondary hyperparathyroidism of renal origin ([place]) 06/04/2017   Macrocytic anemia 05/24/2017   Uremia 05/24/2017   Hypertensive urgency 05/24/2017   Acute kidney injury superimposed on CKD ([place]) 05/24/2017   CKD (chronic kidney disease), stage V ([place]) 05/24/2017   GI bleeding 05/24/2017   Anemia of chronic disease 05/24/2017   Stroke ([place]) 2018   AKI (acute kidney injury) ([place]) 04/10/2015   History of completed stroke    Essential hypertension 04/09/2015    Orientation RESPIRATION BLADDER Height & Weight     Self, Time, Situation, Place  O2 Continent Weight: 130 lb 1.1 oz (59 kg) Height:     BEHAVIORAL SYMPTOMS/MOOD NEUROLOGICAL BOWEL NUTRITION STATUS      Continent Diet (see discharge summary)  AMBULATORY STATUS COMMUNICATION OF NEEDS Skin   Total Care Verbally Normal                       Personal Care Assistance Level of Assistance  Bathing, Feeding, Dressing Bathing Assistance: Maximum assistance Feeding assistance: Limited assistance Dressing Assistance: Maximum assistance     Functional Limitations Info  Sight, Hearing, Speech Sight Info: Adequate Hearing Info: Adequate Speech Info: Adequate    SPECIAL CARE FACTORS FREQUENCY  PT (By licensed PT), OT (By licensed OT)     PT Frequency: 5x week OT Frequency: 5x week            Contractures Contractures Info: Not  present    Additional Factors Info  Code Status, Allergies Code Status Info: full Allergies Info: vancomycin           Current Medications (07/04/2021):  This is the current hospital active medication list Current Facility-Administered Medications  Medication Dose Route Frequency Provider Last Rate Last Admin   0.9 %  sodium chloride infusion  250 mL Intravenous Continuous Ogan, Okoronkwo U, MD       albuterol (PROVENTIL) (2.5 MG/3ML) 0.083% nebulizer solution 2.5 mg  2.5 mg Nebulization Q6H Renee Pain, MD   2.5 mg at 07/04/21 0755   albuterol (PROVENTIL) (2.5 MG/3ML) 0.083% nebulizer solution 2.5 mg  2.5 mg Nebulization Q2H PRN Renee Pain, MD       benzonatate (TESSALON) capsule 200 mg  200 mg Oral Q6H PRN Collene Gobble, MD   200 mg at 06/30/21 1713   budesonide (PULMICORT) nebulizer solution 0.25 mg  0.25 mg Nebulization BID Renee Pain, MD   0.25 mg at 07/04/21 0755   Chlorhexidine Gluconate Cloth 2 % PADS 6 each  6 each Topical Q0600 Roney Jaffe, MD   6 each at 07/04/21 0430   chlorpheniramine-HYDROcodone (TUSSIONEX) 10-8 MG/5ML suspension 5 mL  5 mL Oral Q12H PRN Collene Gobble, MD   5 mL at 06/30/21 1115   cinacalcet (  SENSIPAR) tablet 60 mg  60 mg Oral QPM Collene Gobble, MD   60 mg at 07/03/21 1715   dextrose 10 % infusion   Intravenous Continuous Margaretmary Lombard, MD 100 mL/hr at 07/04/21 0705 New Bag at 07/04/21 0705   docusate sodium (COLACE) capsule 100 mg  100 mg Oral BID PRN Renee Pain, MD       feeding supplement (NEPRO CARB STEADY) liquid 237 mL  237 mL Oral TID BM Roney Jaffe, MD   Held at 07/04/21 [phone]   ferric citrate (AURYXIA) tablet 420 mg  420 mg Oral TID WC Collene Gobble, MD   420 mg at 06/30/21 1847   [START ON 07/05/2021] heparin injection 2,000 Units  2,000 Units Dialysis Once in dialysis Roney Jaffe, MD       heparin injection 5,000 Units  5,000 Units Subcutaneous Q8H Renee Pain, MD   5,000 Units at 07/04/21 0430    MEDLINE mouth rinse  15 mL Mouth Rinse BID Renee Pain, MD   15 mL at 07/02/21 2226   melatonin tablet 3 mg  3 mg Oral QHS Tilden Dome, MD   3 mg at 07/03/21 2214   midodrine (PROAMATINE) tablet 10 mg  10 mg Oral TID WC Jacky Kindle, MD   10 mg at 07/04/21 9518   multivitamin (RENA-VIT) tablet 1 tablet  1 tablet Oral Daily Collene Gobble, MD   1 tablet at 07/03/21 2215   norepinephrine (LEVOPHED) 4mg  in 248mL premix infusion  2-10 mcg/min Intravenous Titrated Priscella Mann, RPH   Stopped at 07/03/21 0825   ondansetron (ZOFRAN) 8 mg in sodium chloride 0.9 % 50 mL IVPB  8 mg Intravenous Q8H PRN Brand Males, MD   Stopped at 07/02/21 1847   pantoprazole (PROTONIX) injection 40 mg  40 mg Intravenous QHS Renee Pain, MD   40 mg at 07/03/21 2212   piperacillin-tazobactam (ZOSYN) IVPB 2.25 g  2.25 g Intravenous Janina Mayo, MD   Held at 07/04/21 0954   polyethylene glycol (MIRALAX / GLYCOLAX) packet 17 g  17 g Oral Daily PRN Renee Pain, MD       sevelamer carbonate (RENVELA) tablet 1,600 mg  1,600 mg Oral TID WC Collene Gobble, MD   1,600 mg at 06/30/21 1847   sodium chloride flush (NS) 0.9 % injection 10-40 mL  10-40 mL Intracatheter Q12H Brand Males, MD   10 mL at 07/03/21 2217   sodium chloride flush (NS) 0.9 % injection 10-40 mL  10-40 mL Intracatheter PRN Brand Males, MD         Discharge Medications: Please see discharge summary for a list of discharge medications.  Relevant Imaging Results:  Relevant Lab Results:   Additional Information SSN: [id number].  Dialysis pt MWF at [facility].  Pt is vaccinated for covid with one booster.  Joanne Chars, LCSW

## 2021-07-04 NOTE — Care Management Important Message (Signed)
Important Message  Patient Details  Name: Thomas Robinson MRN: [id number] Date of Birth: Oct 24, 1960   Medicare Important Message Given:  Yes     Orbie Pyo 07/04/2021, 2:53 PM

## 2021-07-04 NOTE — Consult Note (Signed)
Cardiology Consultation:   Patient ID: Thomas Robinson MRN: [id number]; DOB: 1961/05/17  Admit date: 06/29/2021 Date of Consult: 07/04/2021  PCP:  [name], PA   [facility] Providers Cardiologist:  [name], DO        Patient Profile:   Thomas Robinson is a 60 y.o. male with a hx of chronic diastolic heart failure, ESRD, CVA, chronic hypoxic respiratory failure, IDA, ascending aortic aneurysm, hypertension who is being seen 07/04/2021 for the evaluation of atrial flutter at the request of Dr. Lupita Leash.  History of Present Illness:   Mr. Thomas Robinson is a 60 year old male with a history of chronic diastolic heart failure, ESRD, CVA, chronic hypoxic respiratory failure, IDA, ascending aortic aneurysm, hypertension who we are consulted for evaluation of atrial flutter.  He presented with shortness of breath, was found to be hypoxic to SPO2 70s.  Found to have right lower lobe pneumonia and right-sided pleural effusion.  Was in septic shock on admission, required pressors.  Treated with broad-spectrum antibiotics in the ICU.  Underwent thoracentesis, which was consistent with transudative effusion.  Oxygen requirement improved, was weaned down to 2 L [place].  Transferred out of the ICU today.  Noted on telemetry to be in atrial flutter.  Most recent vital signs show BP 104/71, pulse 107, SPO2 96% on 2 L.  Labs from today notable for potassium 5.5, AST 738, ALT 1130 (trending down from 2105 and 1405 on 11/2), INR 2.1 on 11/2, albumin 2.2, hemoglobin 9.9, WBC 9.6, platelets 125.  EKG shows atrial flutter with variable conduction, rate 111, LVH.  Echocardiogram on 11/1 showed EF 70 to 75%, severe concentric LVH, moderate RV dysfunction, mild RV enlargement, severe biatrial enlargement, moderate TR, mild  aortic stenosis, mild AI.  He reports his dyspnea has improved significantly, feels almost back to baseline.  Denies any chest pain or palpitations.    Past Medical History:  Diagnosis Date    Anaphylactic shock, unspecified, sequela 06/10/2019   Aortic stenosis    mild-moderate AS 12/2020 echo   Arthritis    Asthma, chronic, unspecified asthma severity, with acute exacerbation 10/23/2017   CAP (community acquired pneumonia) 10/23/2017   Carpal tunnel syndrome of right wrist 10/05/2018   Cataract    right - removed by surgery   Cerebral infarction due to thrombosis of cerebral artery (HCC)    Cervical disc herniation 01/04/2020   Chronic diastolic heart failure ([place]) 04/13/2018   Chronic low back pain 11/24/2019   Constipation    Cough    chronic cough   Dyspnea    Encounter for immunization 07/08/2017   ESRD on hemodialysis ([place]) 02/16/2018   Fall 06/04/2019   GERD (gastroesophageal reflux disease) 06/04/2019   GI bleeding 05/24/2017   Gram-negative sepsis, unspecified ([place]) 09/05/2017   History of fusion of cervical spine 03/26/2020   Hyperlipidemia    Hypertension    Hypokalemia 06/04/2017   Iron deficiency anemia, unspecified 09/09/2017   Left shoulder pain 11/11/2019   Lumbar radiculopathy 11/24/2019   Lung contusion 06/04/2019   LVH (left ventricular hypertrophy) due to hypertensive disease, with heart failure ([place]) 06/18/2020   Macrocytic anemia 05/24/2017   Moderate protein-calorie malnutrition ([place]) 06/06/2017   Myofascial pain syndrome 06/12/2020   Neuritis of right ulnar nerve 09/13/2018   Non-compliance with renal dialysis ([place]) 02/08/2020   Oxygen deficiency 12/30/2019   O2 sats on RA 87% at PAT appt    Pain in joint of right elbow 09/13/2018   Pulmonary hypertension ([place])  Renovascular hypertension 06/22/2020   Restless leg syndrome    Rib fractures 06/2019   Right   S/P cardiac cath 08/27/2020   normal coronary arteries.   Secondary hyperparathyroidism of renal origin ([place]) 06/04/2017   Thoracic ascending aortic aneurysm    4.5 cm 06/04/19 CT   Ulnar neuropathy 10/05/2018   Volume overload 10/23/2017    Past Surgical History:  Procedure  Laterality Date   A/V FISTULAGRAM N/A 01/01/2021   Procedure: A/V FISTULAGRAM;  Surgeon: Serafina Mitchell, MD;  Location: [place] [facility];  Service: Cardiovascular;  Laterality: N/A;   A/V FISTULAGRAM N/A 01/15/2021   Procedure: A/V [id number];  Surgeon: Serafina Mitchell, MD;  Location: [place] [facility];  Service: Cardiovascular;  Laterality: N/A;   ANTERIOR CERVICAL DECOMP/DISCECTOMY FUSION N/A 01/04/2020   Procedure: ANTERIOR CERVICAL DECOMPRESSION/DISCECTOMY FUSION CERVICAL FIVE THROUGH SEVEN;  Surgeon: Melina Schools, MD;  Location: [place];  Service: Orthopedics;  Laterality: N/A;  3 hrs   AV FISTULA PLACEMENT Left 05/28/2017   Procedure: LEFT ARM ARTERIOVENOUS (AV) FISTULA CREATION;  Surgeon: Conrad Badger, MD;  Location: [place];  Service: Vascular;  Laterality: Left;   AV FISTULA PLACEMENT Left 02/02/2021   Procedure: Debride left forearm, ligation of left upper arm Aretiovenous fistula;  Surgeon: Elam Dutch, MD;  Location: [facility];  Service: Vascular;  Laterality: Left;   AV FISTULA PLACEMENT Right 04/04/2021   Procedure: RIGHT ARTERIOVENOUS (AV) FISTULA CREATION;  Surgeon: Serafina Mitchell, MD;  Location: [facility];  Service: Vascular;  Laterality: Right;   EYE SURGERY Right 06/02/2019   Cataract removed   FRACTURE SURGERY     INSERTION OF DIALYSIS CATHETER Right 02/02/2021   Procedure: INSERTION OF Right Internal jugular TUNNELED  DIALYSIS CATHETER;  Surgeon: Elam Dutch, MD;  Location: [place];  Service: Vascular;  Laterality: Right;   IR DIALY SHUNT INTRO NEEDLE/INTRACATH INITIAL W/IMG LEFT Left 09/12/2020   IR FLUORO GUIDE CV LINE RIGHT  05/25/2017   IR US GUIDE VASC ACCESS LEFT  09/12/2020   IR US GUIDE VASC ACCESS RIGHT  05/25/2017   LIGATION OF ARTERIOVENOUS  FISTULA Left 01/10/2021   Procedure: LIGATION OF LEFT ARM RADIOCEPHALIC FISTULA;  Surgeon: Serafina Mitchell, MD;  Location: [place];  Service: Vascular;  Laterality: Left;   PERIPHERAL VASCULAR BALLOON ANGIOPLASTY Left 01/15/2021    Procedure: PERIPHERAL VASCULAR BALLOON ANGIOPLASTY;  Surgeon: Serafina Mitchell, MD;  Location: [place] [facility];  Service: Cardiovascular;  Laterality: Left;  AVF   REVISON OF ARTERIOVENOUS FISTULA Left 07/18/2019   Procedure: REVISION PLICATION OF RADIOCEPHALIC ARTERIOVENOUS FISTULA LEFT ARM;  Surgeon: Angelia Mould, MD;  Location: [facility] OR;  Service: Vascular;  Laterality: Left;   REVISON OF ARTERIOVENOUS FISTULA Left 01/10/2021   Procedure: CONVERSION TO BRACHIOCEPHALIC ARTERIOVENOUS FISTULA;  Surgeon: Serafina Mitchell, MD;  Location[facility];  Service: Vascular;  Laterality: Left;   RINOPLASTY         Inpatient Medications: Scheduled Meds:  albuterol  2.5 mg Nebulization BID   budesonide (PULMICORT) nebulizer solution  0.25 mg Nebulization BID   Chlorhexidine Gluconate Cloth  6 each Topical Q0600   cinacalcet  60 mg Oral QPM   feeding supplement (NEPRO CARB STEADY)  237 mL Oral TID BM   ferric citrate  420 mg Oral TID WC   heparin  5,000 Units Subcutaneous Q8H   mouth rinse  15 mL Mouth Rinse BID   melatonin  3 mg Oral QHS   metoprolol tartrate  12.5 mg Oral BID   midodrine  10 mg Oral TID WC   multivitamin  1 tablet Oral Daily   pantoprazole (PROTONIX) IV  40 mg Intravenous QHS   sevelamer carbonate  1,600 mg Oral TID WC   sodium chloride flush  10-40 mL Intracatheter Q12H   Continuous Infusions:  sodium chloride     dextrose 100 mL/hr at 07/04/21 0705   norepinephrine (LEVOPHED) Adult infusion Stopped (07/03/21 0825)   ondansetron (ZOFRAN) IV Stopped (07/02/21 1847)   piperacillin-tazobactam (ZOSYN)  IV Stopped (07/04/21 0954)   PRN Meds: albuterol, benzonatate, chlorpheniramine-HYDROcodone, docusate sodium, ondansetron (ZOFRAN) IV, polyethylene glycol, sodium chloride flush  Allergies:    Allergies  Allergen Reactions   Vancomycin Shortness Of Breath and Itching    Social History:   Social History   Socioeconomic History   Marital status: Single    Spouse  name: Not on file   Number of children: Not on file   Years of education: Not on file   Highest education level: Not on file  Occupational History   Not on file  Tobacco Use   Smoking status: Never   Smokeless tobacco: Never  Vaping Use   Vaping Use: Never used  Substance and Sexual Activity   Alcohol use: Not Currently    Alcohol/week: 0.0 standard drinks    Comment: has a drink once in a while- 12/30/19   Drug use: No   Sexual activity: Not Currently  Other Topics Concern   Not on file  Social History Narrative   Lives with a roommate in a one story home.  Has 1 child.  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  Education: high school.   Social Determinants of Health   Financial Resource Strain: Not on file  Food Insecurity: Not on file  Transportation Needs: Not on file  Physical Activity: Not on file  Stress: Not on file  Social Connections: Not on file  Intimate Partner Violence: Not on file    Family History:    Family History  Problem Relation Age of Onset   Cancer Mother      ROS:  Please see the history of present illness.   All other ROS reviewed and negative.     Physical Exam/Data:   Vitals:   07/04/21 1500 07/04/21 1530 07/04/21 1610 07/04/21 1624  BP: (!) 86/78 100/60 96/74 104/71  Pulse: (!) 54 (!) 109 (!) 115 (!) 107  Resp: 18  12   Temp:   (!) 97.5 F (36.4 C)   TempSrc:   Oral   SpO2:   96%   Weight:   57.8 kg     Intake/Output Summary (Last 24 hours) at 07/04/2021 1741 Last data filed at 07/04/2021 1541 Gross per 24 hour  Intake 50 ml  Output 1772 ml  Net -1722 ml   Last 3 Weights 07/04/2021 07/04/2021 07/02/2021  Weight (lbs) 127 lb 6.8 oz 131 lb 2.8 oz 130 lb 1.1 oz  Weight (kg) 57.8 kg 59.5 kg 59 kg     Body mass index is 19.37 kg/m.  General:   in no acute distress HEENT: normal Neck: mild JVD Vascular: No carotid bruits Cardiac:  tachycardic, irregular, 2/6 systolic murmur Lungs:  clear to auscultation bilaterally Abd: soft,  nontender Ext: no edema Musculoskeletal:  No deformities Skin: warm and dry  Neuro:  no focal abnormalities noted Psych:  Normal affect   EKG:  The EKG was personally reviewed and demonstrates: EKG on 10/29 shows  atrial flutter, rate 133, LVH with repolarization abnormalities.  EKG today shows atrial flutter with variable conduction, rate 111, LVH with repolarization abnormality Telemetry:  Telemetry was personally reviewed and demonstrates:  Atrial flutter with variable conduction since 10/29  Relevant CV Studies: Echo 07/02/21:  1. Strain attempted but patient asked to stop. Would recomm limited echo  with strain only.   2. LV cavitiy is small. Turburlent flow through LV. Near caviity  obliteration. . Left ventricular ejection fraction, by estimation, is 70  to 75%. The left ventricle has hyperdynamic function. The left ventricle  has no regional wall motion abnormalities.   There is severe concentric left ventricular hypertrophy. Left ventricular  diastolic function could not be evaluated.   3. Right ventricular systolic function is moderately reduced. The right  ventricular size is mildly enlarged. Mildly increased right ventricular  wall thickness.   4. Left atrial size was severely dilated.   5. Right atrial size was severely dilated.   6. Mild mitral valve regurgitation. Moderate to severe mitral annular  calcification.   7. Tricuspid valve regurgitation is moderate.   8. AV is thickened with restricted motion. Peak and mean gradients  through the valve are 22 and 12 mm hg respectively. Dimensionless gradient  is 0.53 consistent with mild AS 2 D imaging suggests that it is moderate.  No significant change in gradient  from May 2022. Aortic valve regurgitation is mild.   9. Aortic dilatation noted. There is mild dilatation of the ascending  aorta, measuring 40 mm.   Laboratory Data:  High Sensitivity Troponin:   Recent Labs  Lab 06/29/21 2237 06/30/21 0352  07/01/21 1329 07/02/21 0420  TROPONINIHS 62* 67* 144* 202*     Chemistry Recent Labs  Lab 06/30/21 0658 07/01/21 1329 07/02/21 0420 07/02/21 1600 07/03/21 0409 07/03/21 1331 07/04/21 0249  NA 137   < > 129*   < > 129* 131* 133*  K 4.2   < > 4.7   < > 5.3* 5.4* 5.5*  CL 98   < > 93*   < > 96* 98 99  CO2 24   < > 17*   < > 18* 19* 19*  GLUCOSE 90   < > 119*   < > 120* 147* 123*  BUN 61*   < > 62*   < > 45* 51* 58*  CREATININE 7.63*   < > 7.90*   < > 6.46* 6.78* 7.22*  CALCIUM 9.2   < > 8.0*   < > 8.4* 8.3* 8.5*  MG 2.4  --  2.6*  --   --   --   --   GFRNONAA 7*   < > 7*   < > 9* 9* 8*  ANIONGAP 15   < > 19*   < > 15 14 15    < > = values in this interval not displayed.    Recent Labs  Lab 07/03/21 0409 07/03/21 1331 07/04/21 0249  PROT 5.7* 5.4* 5.5*  ALBUMIN 2.3* 2.2* 2.2*  AST 2,105* 1,389* 738*  ALT 1,405* 1,296* 1,130*  ALKPHOS 78 79 85  BILITOT 1.1 1.2 1.2   Lipids No results for input(s): CHOL, TRIG, HDL, LABVLDL, LDLCALC, CHOLHDL in the last 168 hours.  Hematology Recent Labs  Lab 07/02/21 0420 07/03/21 0412 07/04/21 0249  WBC 7.2 7.4 9.6  RBC 2.76* 2.84* 3.01*  HGB 9.1* 9.1* 9.9*  HCT 28.1* 28.9* 29.6*  MCV 101.8* 101.8* 98.3  MCH 33.0 32.0 32.9  MCHC 32.4  31.5 33.4  RDW 17.6* 17.2* 17.2*  PLT 122* 99* 125*   Thyroid No results for input(s): TSH, FREET4 in the last 168 hours.  BNPNo results for input(s): BNP, PROBNP in the last 168 hours.  DDimer No results for input(s): DDIMER in the last 168 hours.   Radiology/Studies:  DG Abd 1 View  Result Date: 07/01/2021 CLINICAL DATA:  Vomiting EXAM: ABDOMEN - 1 VIEW COMPARISON:  None. FINDINGS: Bowel-gas pattern appears within normal limits. No abnormally distended gas-filled loops of bowel to suggest obstruction. No suspicious calcifications identified. IMPRESSION: No acute process identified. Electronically Signed   By: Ofilia Neas M.D.   On: 07/01/2021 12:58   CT HEAD WO CONTRAST  (5MM)  Result Date: 07/01/2021 CLINICAL DATA:  Seizure, abnormal neuro exam. EXAM: CT HEAD WITHOUT CONTRAST TECHNIQUE: Contiguous axial images were obtained from the base of the skull through the vertex without intravenous contrast. COMPARISON:  CT head 05/30/2021.  MRI brain 04/09/2015. FINDINGS: Brain: There has been recent contrast administration. No evidence of acute infarction, hemorrhage, hydrocephalus, extra-axial collection or mass lesion/mass effect. Again seen is mild diffuse atrophy and mild periventricular white matter hypodensity, likely chronic small vessel ischemic change. Vascular: Atherosclerotic calcifications are present within the cavernous internal carotid arteries. Skull: Normal. Negative for fracture or focal lesion. Sinuses/Orbits: No acute finding. Other: None. IMPRESSION: No acute intracranial abnormality. Electronically Signed   By: Ronney Asters M.D.   On: 07/01/2021 22:17   DG CHEST PORT 1 VIEW  Result Date: 07/02/2021 CLINICAL DATA:  Status post right thoracentesis. EXAM: PORTABLE CHEST 1 VIEW COMPARISON:  07/02/2021 FINDINGS: There is a right chest wall dialysis catheter with tips in the cavoatrial junction. Heart size and mediastinal contours are stable. Decreased volume of the right pleural effusion status post thoracentesis. No pneumothorax identified. Atelectasis is identified within the right lung base. Similar appearance of mild bilateral hazy lung opacities throughout both lungs. IMPRESSION: 1. Decrease in volume of right pleural effusion status post thoracentesis. 2. No pneumothorax. Electronically Signed   By: Kerby Moors M.D.   On: 07/02/2021 15:44   DG CHEST PORT 1 VIEW  Result Date: 07/02/2021 CLINICAL DATA:  Weakness, pulmonary edema EXAM: PORTABLE CHEST 1 VIEW COMPARISON:  Portable exam 0838 hours compared to 06/29/2021 FINDINGS: RIGHT jugular dual lumen central venous catheter with tip projecting over SVC above cavoatrial junction. Enlargement of cardiac  silhouette with pulmonary vascular congestion. Diffuse infiltrates consistent with pulmonary edema. Moderate RIGHT pleural effusion. No pneumothorax. Atherosclerotic calcification aorta. Bones demineralized with prior cervical spine fusion. IMPRESSION: Pulmonary infiltrate with increased RIGHT pleural effusion since previous exam. Aortic Atherosclerosis (ICD10-I70.0). Electronically Signed   By: Lavonia Dana M.D.   On: 07/02/2021 10:21   EEG adult  Result Date: 07/01/2021 Lora Havens, MD     07/01/2021  8:53 PM Patient Name: Trevell Pariseau MRN: [id number] Epilepsy Attending: Lora Havens Referring Physician/Provider: Dr Susie Cassette Date: 07/01/2021 Duration: 24.30 mins Patient history: 60yo M patient was noted to seizure-like activity, he was found unresponsive after generalized tonic-clonic shaking.  Seizure activity stopped spontaneously after 1 minute. Post seizure patient was noted to be confused, hypoxic. EEG to evaluate for seizure. Level of alertness: Awake, asleep AEDs during EEG study: LEV Technical aspects: This EEG study was done with scalp electrodes positioned according to the 10-20 International system of electrode placement. Electrical activity was acquired at a sampling rate of 500Hz  and reviewed with a high frequency filter of 70Hz  and a low frequency filter of  1Hz . EEG data were recorded continuously and digitally stored. Description: No posterior dominant rhythm was seen. Sleep was characterized by sleep spindles (12 to 14 Hz), maximal frontocentral region. EEG showed continuous generalized 3 to 6 Hz theta-delta slowing. Hyperventilation and photic stimulation were not performed.   ABNORMALITY - Continuous slow, generalized IMPRESSION: This study is suggestive of moderate diffuse encephalopathy, nonspecific etiology. No seizures or epileptiform discharges were seen throughout the recording. Lora Havens   ECHOCARDIOGRAM COMPLETE  Result Date: 07/02/2021    ECHOCARDIOGRAM  REPORT   Patient Name:   NAFIS FARNAN Date of Exam: 07/02/2021 Medical Rec #:  [id number]        Height:       68.0 in Accession #:    [id number]       Weight:       130.1 lb Date of Birth:  [birth year]        BSA:          1.702 m Patient Age:    6 years         BP:           125/48 mmHg Patient Gender: M                HR:           119 bpm. Exam Location:  Inpatient Procedure: 2D Echo, Cardiac Doppler and Color Doppler Indications:    CHF  History:        Patient has prior history of Echocardiogram examinations, most                 recent 01/13/2021. CHF, Pulmonary HTN, Aortic Valve Disease,                 Signs/Symptoms:Hypotension; Risk Factors:Hypertension. ESRD.  Sonographer:    Merrie Roof RDCS Referring Phys: [phone] [facility]  Sonographer Comments: Global longitudinal strain was attempted. The patient could no longer tolerate the exam. Strain attempted, before told to stop. IMPRESSIONS  1. Strain attempted but patient asked to stop. Would recomm limited echo with strain only.  2. LV cavitiy is small. Turburlent flow through LV. Near caviity obliteration. . Left ventricular ejection fraction, by estimation, is 70 to 75%. The left ventricle has hyperdynamic function. The left ventricle has no regional wall motion abnormalities.  There is severe concentric left ventricular hypertrophy. Left ventricular diastolic function could not be evaluated.  3. Right ventricular systolic function is moderately reduced. The right ventricular size is mildly enlarged. Mildly increased right ventricular wall thickness.  4. Left atrial size was severely dilated.  5. Right atrial size was severely dilated.  6. Mild mitral valve regurgitation. Moderate to severe mitral annular calcification.  7. Tricuspid valve regurgitation is moderate.  8. AV is thickened with restricted motion. Peak and mean gradients through the valve are 22 and 12 mm hg respectively. Dimensionless gradient is 0.53 consistent with mild AS 2 D imaging  suggests that it is moderate. No significant change in gradient from May 2022. Aortic valve regurgitation is mild.  9. Aortic dilatation noted. There is mild dilatation of the ascending aorta, measuring 40 mm. FINDINGS  Left Ventricle: LV cavitiy is small. Turburlent flow through LV. Near caviity obliteration. Left ventricular ejection fraction, by estimation, is 70 to 75%. The left ventricle has hyperdynamic function. The left ventricle has no regional wall motion abnormalities. The left ventricular internal cavity size was small. There is severe concentric left ventricular hypertrophy. Left ventricular diastolic function could not  be evaluated. Right Ventricle: The right ventricular size is mildly enlarged. Mildly increased right ventricular wall thickness. Right ventricular systolic function is moderately reduced. Left Atrium: Left atrial size was severely dilated. Right Atrium: Right atrial size was severely dilated. Pericardium: Trivial pericardial effusion is present. Mitral Valve: There is mild thickening of the mitral valve leaflet(s). Moderate to severe mitral annular calcification. Mild mitral valve regurgitation. Tricuspid Valve: The tricuspid valve is grossly normal. Tricuspid valve regurgitation is moderate. Aortic Valve: AV is thickened with restricted motion. Peak and mean gradients through the valve are 22 and 12 mm hg respectively. Dimensionless gradient is 0.53 consistent with mild AS 2 D imaging suggests that it is moderate. No significant change in gradient from May 2022. Aortic valve regurgitation is mild. Aortic valve mean gradient measures 12.0 mmHg. Aortic valve peak gradient measures 21.7 mmHg. Pulmonic Valve: The pulmonic valve was not well visualized. Pulmonic valve regurgitation is mild. Aorta: Aortic dilatation noted. There is mild dilatation of the ascending aorta, measuring 40 mm. IAS/Shunts: No atrial level shunt detected by color flow Doppler.  LEFT VENTRICLE PLAX 2D LVIDd:          2.90 cm LVIDs:         1.70 cm LV PW:         1.70 cm LV IVS:        1.70 cm  RIGHT VENTRICLE RV Basal diam:  4.50 cm RV Mid diam:    3.70 cm RV S prime:     11.10 cm/s TAPSE (M-mode): 1.3 cm LEFT ATRIUM              Index        RIGHT ATRIUM           Index LA diam:        3.50 cm  2.06 cm/m   RA Area:     28.60 cm LA Vol (A2C):   139.0 ml 81.66 ml/m  RA Volume:   99.30 ml  58.34 ml/m LA Vol (A4C):   75.6 ml  44.41 ml/m LA Biplane Vol: 103.0 ml 60.51 ml/m  AORTIC VALVE AV Vmax:           233.00 cm/s AV Vmean:          159.000 cm/s AV VTI:            0.304 m AV Peak Grad:      21.7 mmHg AV Mean Grad:      12.0 mmHg LVOT Vmax:         128.00 cm/s LVOT Vmean:        80.800 cm/s LVOT VTI:          0.162 m LVOT/AV VTI ratio: 0.53  AORTA Ao Root diam: 3.50 cm Ao Asc diam:  4.00 cm TRICUSPID VALVE TR Peak grad:   74.6 mmHg TR Vmax:        432.00 cm/s  SHUNTS Systemic VTI: 0.16 m Dorris Carnes MD Electronically signed by Dorris Carnes MD Signature Date/Time: 07/02/2021/7:51:14 PM    Final    VAS [place] LOWER EXTREMITY VENOUS (DVT)  Result Date: 07/01/2021  Lower Venous DVT Study Patient Name:  SABA NEUMAN  Date of Exam:   07/01/2021 Medical Rec #: [id number]         Accession #:    [id number] Date of Birth: 08/16/1961         Patient Gender: M Patient Age:   77 years Exam Location:  [facility] Procedure:  VAS [place] LOWER EXTREMITY VENOUS (DVT) Referring Phys: MURALI RAMASWAMY --------------------------------------------------------------------------------  Indications: Edema.  Risk Factors: None identified. Comparison Study: No prior studies. Performing Technologist: Oliver Hum RVT  Examination Guidelines: A complete evaluation includes B-mode imaging, spectral Doppler, color Doppler, and power Doppler as needed of all accessible portions of each vessel. Bilateral testing is considered an integral part of a complete examination. Limited examinations for reoccurring indications may be performed as noted.  The reflux portion of the exam is performed with the patient in reverse Trendelenburg.  +---------+---------------+---------+-----------+----------+--------------+ RIGHT    CompressibilityPhasicitySpontaneityPropertiesThrombus Aging +---------+---------------+---------+-----------+----------+--------------+ CFV      Full           Yes      No                                  +---------+---------------+---------+-----------+----------+--------------+ SFJ      Full                                                        +---------+---------------+---------+-----------+----------+--------------+ FV Prox  Full                                                        +---------+---------------+---------+-----------+----------+--------------+ FV Mid   Full                                                        +---------+---------------+---------+-----------+----------+--------------+ FV DistalFull                                                        +---------+---------------+---------+-----------+----------+--------------+ PFV      Full                                                        +---------+---------------+---------+-----------+----------+--------------+ POP      Full           Yes      No                                  +---------+---------------+---------+-----------+----------+--------------+ PTV      Full                                                        +---------+---------------+---------+-----------+----------+--------------+ PERO     Full                                                        +---------+---------------+---------+-----------+----------+--------------+   +---------+---------------+---------+-----------+----------+--------------+  LEFT     CompressibilityPhasicitySpontaneityPropertiesThrombus Aging +---------+---------------+---------+-----------+----------+--------------+ CFV      Full           Yes       No                                  +---------+---------------+---------+-----------+----------+--------------+ SFJ      Full                                                        +---------+---------------+---------+-----------+----------+--------------+ FV Prox  Full                                                        +---------+---------------+---------+-----------+----------+--------------+ FV Mid   Full                                                        +---------+---------------+---------+-----------+----------+--------------+ FV DistalFull                                                        +---------+---------------+---------+-----------+----------+--------------+ PFV      Full                                                        +---------+---------------+---------+-----------+----------+--------------+ POP      Full           Yes      No                                  +---------+---------------+---------+-----------+----------+--------------+ PTV      Full                                                        +---------+---------------+---------+-----------+----------+--------------+ PERO     Full                                                        +---------+---------------+---------+-----------+----------+--------------+     Summary: RIGHT: - There is no evidence of deep vein thrombosis in the lower extremity.  - No cystic structure found in the popliteal fossa.  LEFT: - There is no evidence of deep vein thrombosis in the lower extremity.  -  No cystic structure found in the popliteal fossa.  *See table(s) above for measurements and observations. Electronically signed by Orlie Pollen on 07/01/2021 at 2:27:07 PM.    Final      Assessment and Plan:   Atrial flutter: New diagnosis.  CHA2DS2-VASc score 4 (CHF, hypertension, CVA).  From review of telemetry and initial EKG on 10/29, has been in atrial flutter since  admission -Start Eliquis 2.5 mg twice daily (low dose given weight and renal function) -Started on low-dose metoprolol for rate control, can titrate as tolerated.  Rate control is limited by his low BP, has been requiring midodrine.  In addition, atrial flutter is often difficult to rate control.  Suspect he may ultimately need TEE/DCCV once he recovers from his acute illness  Chronic diastolic heart failure: Echo 11/1 shows hyperdynamic LV function, moderately reduced RV function, severe LVH, mild AS -Continue volume removal with HD -Given his severe LVH, recommend ruling out cardiac amyloid.  Will check SPEP/UPEP/light chains and would consider outpatient PYP scan  Septic shock: Due to pneumonia.  Required pressors in ICU.  Resolved.  Antibiotics per IM.  Acute liver injury: Due to septic shock.  Improving.  ESRD: On HD  For questions or updates, please contact [place] Please consult [email or web address] for contact info under    Signed, Donato Heinz, MD  07/04/2021 5:41 PM

## 2021-07-04 NOTE — Discharge Instructions (Addendum)

## 2021-07-04 NOTE — Progress Notes (Signed)
ANTICOAGULATION CONSULT NOTE - Initial Consult  Pharmacy Consult for apixaban Indication: atrial fibrillation  Allergies  Allergen Reactions   Vancomycin Shortness Of Breath and Itching    Patient Measurements: Weight: 57.8 kg (127 lb 6.8 oz)   Vital Signs: Temp: 97.5 F (36.4 C) (11/03 1610) Temp Source: Oral (11/03 1610) BP: 95/76 (11/03 1746) Pulse Rate: 107 (11/03 1624)  Labs: Recent Labs    07/02/21 0420 07/02/21 1600 07/03/21 0409 07/03/21 0412 07/03/21 1031 07/03/21 1331 07/04/21 0249 07/04/21 1841  HGB 9.1*  --   --  9.1*  --   --  9.9*  --   HCT 28.1*  --   --  28.9*  --   --  29.6*  --   PLT 122*  --   --  99*  --   --  125*  --   APTT 50*  --   --   --   --   --   --   --   LABPROT  --   --   --  23.9*  --   --   --  18.3*  INR  --   --   --  2.1*  --   --   --  1.5*  CREATININE 7.90*   < > 6.46*  --   --  6.78* 7.22*  --   CKTOTAL  --   --   --   --  30*  --   --   --   TROPONINIHS 202*  --   --   --   --   --   --   --    < > = values in this interval not displayed.    Estimated Creatinine Clearance: 8.7 mL/min (A) (by C-G formula based on SCr of 7.22 mg/dL (H)).   Medical History: Past Medical History:  Diagnosis Date   Anaphylactic shock, unspecified, sequela 06/10/2019   Aortic stenosis    mild-moderate AS 12/2020 echo   Arthritis    Asthma, chronic, unspecified asthma severity, with acute exacerbation 10/23/2017   CAP (community acquired pneumonia) 10/23/2017   Carpal tunnel syndrome of right wrist 10/05/2018   Cataract    right - removed by surgery   Cerebral infarction due to thrombosis of cerebral artery (HCC)    Cervical disc herniation 01/04/2020   Chronic diastolic heart failure ([place]) 04/13/2018   Chronic low back pain 11/24/2019   Constipation    Cough    chronic cough   Dyspnea    Encounter for immunization 07/08/2017   ESRD on hemodialysis ([place]) 02/16/2018   Fall 06/04/2019   GERD (gastroesophageal reflux disease)  06/04/2019   GI bleeding 05/24/2017   Gram-negative sepsis, unspecified ([place]) 09/05/2017   History of fusion of cervical spine 03/26/2020   Hyperlipidemia    Hypertension    Hypokalemia 06/04/2017   Iron deficiency anemia, unspecified 09/09/2017   Left shoulder pain 11/11/2019   Lumbar radiculopathy 11/24/2019   Lung contusion 06/04/2019   LVH (left ventricular hypertrophy) due to hypertensive disease, with heart failure ([place]) 06/18/2020   Macrocytic anemia 05/24/2017   Moderate protein-calorie malnutrition ([place]) 06/06/2017   Myofascial pain syndrome 06/12/2020   Neuritis of right ulnar nerve 09/13/2018   Non-compliance with renal dialysis ([place]) 02/08/2020   Oxygen deficiency 12/30/2019   O2 sats on RA 87% at PAT appt    Pain in joint of right elbow 09/13/2018   Pulmonary hypertension ([place])    Renovascular hypertension 06/22/2020   Restless  leg syndrome    Rib fractures 06/2019   Right   S/P cardiac cath 08/27/2020   normal coronary arteries.   Secondary hyperparathyroidism of renal origin ([place]) 06/04/2017   Thoracic ascending aortic aneurysm    4.5 cm 06/04/19 CT   Ulnar neuropathy 10/05/2018   Volume overload 10/23/2017    Medications:  Medications Prior to Admission  Medication Sig Dispense Refill Last Dose   acetaminophen (TYLENOL) 500 MG tablet Take 500 mg by mouth every 6 (six) hours as needed for mild pain, moderate pain or headache.   Past Week   albuterol (VENTOLIN HFA) 108 (90 Base) MCG/ACT inhaler Inhale 2 puffs into the lungs 2 (two) times daily as needed for shortness of breath or wheezing. 1 each 1 06/29/2021   amLODipine (NORVASC) 10 MG tablet Take 1 tablet (10 mg total) by mouth daily. 30 tablet 1 06/28/2021 at pm   AURYXIA 1 GM 210 MG(Fe) tablet Take 420 mg by mouth 3 (three) times daily with meals.   06/28/2021 at pm   BREO ELLIPTA 100-25 MCG/INH AEPB Inhale 1 puff into the lungs daily. 30 each 1 06/29/2021 at am   cinacalcet (SENSIPAR) 60 MG tablet Take 1  tablet (60 mg total) by mouth every evening. 30 tablet 1 06/29/2021   DIPHENHYDRAMINE HCL PO Take 25 mg by mouth as needed (allergies).   06/28/2021   furosemide (LASIX) 80 MG tablet Take 1 tablet (80 mg total) by mouth daily as needed for fluid (Use only on non-HD days (Tues/Thur/Sat/Sun)). 30 tablet 1 06/28/2021   hydrALAZINE (APRESOLINE) 25 MG tablet Take 2 tablets (50 mg total) by mouth 3 (three) times daily. 90 tablet 1 Past Week   metoprolol tartrate (LOPRESSOR) 50 MG tablet Take 1 tablet (50 mg total) by mouth 2 (two) times daily. 60 tablet 1 06/29/2021 at 2100   multivitamin (RENA-VIT) TABS tablet Take 1 tablet by mouth daily.   06/29/2021   sevelamer carbonate (RENVELA) 800 MG tablet Take 1,600 mg by mouth 3 (three) times daily.   06/29/2021   Scheduled:   albuterol  2.5 mg Nebulization BID   budesonide (PULMICORT) nebulizer solution  0.25 mg Nebulization BID   Chlorhexidine Gluconate Cloth  6 each Topical Q0600   cinacalcet  60 mg Oral QPM   feeding supplement (NEPRO CARB STEADY)  237 mL Oral TID BM   ferric citrate  420 mg Oral TID WC   heparin  5,000 Units Subcutaneous Q8H   mouth rinse  15 mL Mouth Rinse BID   melatonin  3 mg Oral QHS   metoprolol tartrate  12.5 mg Oral BID   midodrine  10 mg Oral TID WC   multivitamin  1 tablet Oral Daily   pantoprazole (PROTONIX) IV  40 mg Intravenous QHS   sevelamer carbonate  1,600 mg Oral TID WC   sodium chloride flush  10-40 mL Intracatheter Q12H    Assessment: Patient has a history of ESRD on HD diagnosed with non-valvular A. fib   Plan:  Start Apixaban 2.5 mg po bid (adjusted for TBW <60 kg and Scr >1.5) Discontinued heparin 5000 unit q8h order Monitor for signs and symptoms of bleeding  Arva Slaugh BS, PharmD, BCPS Clinical Pharmacist  07/04/2021,7:48 PM

## 2021-07-04 NOTE — Progress Notes (Signed)
Pt receives out-pt HD at [facility] on MWF. Pt arrives at 12:00 for 12:15 chair time. Possible snf placement noted. Will assist as needed.  Melven Sartorius Renal Navigator [phone]
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
## 2021-07-04 NOTE — Progress Notes (Signed)
[facility] Progress Note  Subjective: seen in room, off pressors, BP's 130's.  No more seizures. F/U CXR showing good clearing on L. R thora done yest 11/1. Breathing better.   Vitals:   07/04/21 0400 07/04/21 0425 07/04/21 0755 07/04/21 0939  BP:  118/76  138/85  Pulse:  (!) 122    Resp:  18    Temp:  97.9 F (36.6 C)    TempSrc:  Oral    SpO2: 98% 96% 98%   Weight:        Exam:   alert, nad   no jvd  Chest clear L, R base dc'd BS  Cor reg no RG  Abd soft ntnd no ascites   Ext no LE edema   Alert, NF, ox3   RUA AVF+bruit/ R IJ TDC    Home meds include - norvasc, auryxia 2 ac, breo ellipta, sensipar, lasix 80 on non hd days, hydralazine 50 tid, lopressor 50 bid, rena vit, renvela 2 ac tid        OP HD: MWF [facility]  4h  400/800  50kg   2/2 bath  Heparin 2000   RIJ TDC/ RUE AVF (maturing)  - sensipar 60 tiw  - venofer 50 weekly  - mircera 225 q2, last 10/19, due 11/2   Assessment/ Plan: PNA bilat/ acute on chronic resp failure - on IV abx and HFNC. Sp thora 1.5 L 11/1. Improving. Moved to floor now.  Shock/ sepsis - resolved. On midodrine 10 tid, BP 118/76 ESRD - on HD MWF. Missed last OP HD. Did CRRT here 10/31 - 11/1. HD today upstairs off schedule. Plan HD tomorrow again to get back on schedule.  Volume - looks dry on exam, no edema on exam. Up by wts though. Get stand wt.  MBD ckd - cont sensipar Anemia ckd - Hb 9-10 range, esa due, will give today darbe 200ug         Rob Hannia Matchett 07/04/2021, 10:34 AM   Recent Labs  Lab 07/02/21 0420 07/02/21 1600 07/03/21 0409 07/03/21 0412 07/03/21 1331 07/04/21 0249  K 4.7 4.8   < >  --  5.4* 5.5*  BUN 62* 39*   < >  --  51* 58*  CREATININE 7.90* 5.52*   < >  --  6.78* 7.22*  CALCIUM 8.0* 8.3*   < >  --  8.3* 8.5*  PHOS 8.1* 5.3*  --   --   --   --   HGB 9.1*  --   --  9.1*  --  9.9*   < > = values in this interval not displayed.    Inpatient medications:  albuterol  2.5 mg Nebulization Q6H    budesonide (PULMICORT) nebulizer solution  0.25 mg Nebulization BID   Chlorhexidine Gluconate Cloth  6 each Topical Q0600   cinacalcet  60 mg Oral QPM   feeding supplement (NEPRO CARB STEADY)  237 mL Oral TID BM   ferric citrate  420 mg Oral TID WC   [START ON 07/05/2021] heparin  2,000 Units Dialysis Once in dialysis   heparin  5,000 Units Subcutaneous Q8H   mouth rinse  15 mL Mouth Rinse BID   melatonin  3 mg Oral QHS   midodrine  10 mg Oral TID WC   multivitamin  1 tablet Oral Daily   pantoprazole (PROTONIX) IV  40 mg Intravenous QHS   sevelamer carbonate  1,600 mg Oral TID WC   sodium chloride flush  10-40 mL Intracatheter  Q12H    sodium chloride     dextrose 100 mL/hr at 07/04/21 0705   norepinephrine (LEVOPHED) Adult infusion Stopped (07/03/21 0825)   ondansetron (ZOFRAN) IV Stopped (07/02/21 1847)   piperacillin-tazobactam (ZOSYN)  IV Stopped (07/04/21 0954)   albuterol, benzonatate, chlorpheniramine-HYDROcodone, docusate sodium, ondansetron (ZOFRAN) IV, polyethylene glycol, sodium chloride flush

## 2021-07-04 NOTE — TOC Initial Note (Signed)
Transition of Care [facility]) - Initial/Assessment Note    Patient Details  Name: Thomas Robinson MRN: [id number] Date of Birth: [birth year]  Transition of Care [facility]) CM/SW Contact:    Joanne Chars, LCSW Phone Number: 07/04/2021, 10:33 AM  Clinical Narrative:   CSW spoke with pt regarding recommendation for SNF.  Pt agreeable, choice document given, permission given to send out referral in hub.  Permission given to speak with roommate Brigitte Pulse.  Pt has HD MWF at noon at [facility].  Current home O2 with [employer].  Current DME in home: walker.  Pt is vaccinated for covid with one booster.                Expected Discharge Plan: Skilled Nursing Facility Barriers to Discharge: Continued Medical Work up, SNF Pending bed offer   Patient Goals and CMS Choice Patient states their goals for this hospitalization and ongoing recovery are:: "Live as long as I can" CMS Medicare.gov Compare Post Acute Care list provided to:: Patient Choice offered to / list presented to : Patient  Expected Discharge Plan and Services Expected Discharge Plan: [place] Acute Care Choice: Nursing Home Living arrangements for the past 2 months: Apartment                                      Prior Living Arrangements/Services Living arrangements for the past 2 months: Apartment Lives with:: Roommate Patient language and need for interpreter reviewed:: Yes Do you feel safe going back to the place where you live?: Yes      Need for Family Participation in Patient Care: No (Comment) Care giver support system in place?: Yes (comment) Current home services: DME (home O2) Criminal Activity/Legal Involvement Pertinent to Current Situation/Hospitalization: No - Comment as needed  Activities of Daily Living Home Assistive Devices/Equipment: Walker (specify type) ADL Screening (condition at time of admission) Patient's cognitive ability adequate to safely complete daily  activities?: Yes Is the patient deaf or have difficulty hearing?: No Does the patient have difficulty seeing, even when wearing glasses/contacts?: No Does the patient have difficulty concentrating, remembering, or making decisions?: No Patient able to express need for assistance with ADLs?: Yes Does the patient have difficulty dressing or bathing?: No Independently performs ADLs?: Yes (appropriate for developmental age) Does the patient have difficulty walking or climbing stairs?: Yes (secondary to shortness of breath) Weakness of Legs: Both Weakness of Arms/Hands: None  Permission Sought/Granted Permission sought to share information with : Family Supports Permission granted to share information with : Yes, Verbal Permission Granted  Share Information with NAME: Court Joy, roomate  Permission granted to share info w AGENCY: SNF        Emotional Assessment Appearance:: Appears stated age Attitude/Demeanor/Rapport: Engaged Affect (typically observed): Appropriate, Pleasant Orientation: : Oriented to Self, Oriented to Place, Oriented to  Time, Oriented to Situation Alcohol / Substance Use: Not Applicable Psych Involvement: No (comment)  Admission diagnosis:  Lobar pneumonia (HCC) [J18.1] Pleural effusion [J90] Acute on chronic respiratory failure with hypoxia (HCC) [J96.21] Acute on chronic respiratory failure, unspecified whether with hypoxia or hypercapnia (HCC) [J96.20] Patient Active Problem List   Diagnosis Date Noted   Pressure injury of skin 07/02/2021   Abnormal chest CT 06/30/2021   Pleural effusion, right 06/30/2021   SIRS (systemic inflammatory response syndrome) ([place]) 06/30/2021   Acute hypoxemic respiratory failure ([place]) 05/18/2021  Bronchitis 05/18/2021   Preoperative cardiovascular examination 03/06/2021   CHF (congestive heart failure) ([place]) 02/17/2021   Cellulitis of arm, left 02/01/2021   Cellulitis of left lower limb 02/01/2021   Cellulitis, unspecified  02/01/2021   Left arm swelling 01/25/2021   Acute and chronic respiratory failure with hypoxia ([place]) 01/13/2021   Acute on chronic respiratory failure with hypoxia ([place]) 01/12/2021   Concussion with no loss of consciousness 01/12/2021   Laceration without foreign body of left forearm, initial encounter 01/12/2021   Age-related physical debility 09/20/2020   Allergy, unspecified, initial encounter 09/20/2020   Nausea 09/20/2020   Pain, unspecified 09/20/2020   Pruritus, unspecified 09/20/2020   S/P cardiac cath 08/27/2020   Acute encephalopathy 08/26/2020   Peripheral neuropathy 08/26/2020   Elevated troponin 08/25/2020   Hyperkalemia 08/05/2020   Arthritis    Asthma    Cataract    Cerebral infarction due to thrombosis of cerebral artery ([place])    History of CVA (cerebrovascular accident)    Chronic kidney disease    Dyspnea    Hyperlipidemia    Hypertension    Kidney failure    Restless leg syndrome    Degenerative lumbar spinal stenosis 69/48/5462   Diastolic dysfunction 70/35/0093   Renovascular hypertension 06/22/2020   Demand ischemia ([place]) 06/18/2020   LVH (left ventricular hypertrophy) due to hypertensive disease, with heart failure ([place]) 06/18/2020   Myofascial pain syndrome 06/12/2020   Encounter for removal of sutures 03/29/2020   History of fusion of cervical spine 03/26/2020   Non-compliance with renal dialysis ([place]) 02/08/2020   Pulmonary edema 01/23/2020   Cervical disc herniation 01/04/2020   Tachycardia 01/04/2020   SOB (shortness of breath)    Hypoxia 12/30/2019   Chronic low back pain 11/24/2019   Degeneration of lumbar intervertebral disc 11/24/2019   Lumbar radiculopathy 11/24/2019   Left shoulder pain 11/11/2019   Anaphylactic shock, unspecified, sequela 06/10/2019   Rib fractures 06/05/2019   Fall at home, initial encounter 06/04/2019   Right rib fracture 06/04/2019   Lung contusion 06/04/2019   Acute respiratory failure with hypoxia ([place])  06/04/2019   Anemia in ESRD (end-stage renal disease) ([place]) 06/04/2019   GERD without esophagitis 06/04/2019   Chronic diastolic (congestive) heart failure ([place]) 06/04/2019   Fall 06/04/2019   Thoracic ascending aortic aneurysm 06/04/2019   Acute exacerbation of congestive heart failure ([place]) 05/16/2019   Carpal tunnel syndrome of right wrist 10/05/2018   Ulnar neuropathy 10/05/2018   Neuritis of right ulnar nerve 09/13/2018   Pain in joint of right elbow 09/13/2018   Chest pain 04/13/2018   Chronic diastolic heart failure ([place]) 04/13/2018   Acute on chronic diastolic CHF (congestive heart failure) ([place]) 04/13/2018   Atypical chest pain 02/16/2018   ESRD on dialysis ([place]) 02/16/2018   End stage renal disease ([place]) 02/16/2018   CAP (community acquired pneumonia) 10/23/2017   Asthma, chronic, unspecified asthma severity, with acute exacerbation 10/23/2017   Respiratory failure, acute ([place]) 10/23/2017   Pulmonary hypertension ([place]) 10/23/2017   Iron deficiency anemia, unspecified 09/09/2017   Gram-negative sepsis, unspecified ([place]) 09/05/2017   Encounter for immunization 07/08/2017   Moderate protein-calorie malnutrition ([place]) 06/06/2017   Coagulation defect, unspecified ([place]) 06/04/2017   Hypokalemia 06/04/2017   Other hyperlipidemia 06/04/2017   Secondary hyperparathyroidism of renal origin ([place]) 06/04/2017   Macrocytic anemia 05/24/2017   Uremia 05/24/2017   Hypertensive urgency 05/24/2017   Acute kidney injury superimposed on CKD ([place]) 05/24/2017   CKD (chronic kidney disease), stage V ([place])  05/24/2017   GI bleeding 05/24/2017   Anemia of chronic disease 05/24/2017   Stroke ([place]) 2018   AKI (acute kidney injury) ([place]) 04/10/2015   History of completed stroke    Essential hypertension 04/09/2015   PCP:  [name], PA Pharmacy:   [id number] [address], [place] [address] Phone: [phone] Fax:  [phone]  [facility] [place], [place] - [address] [employer], [place] [address] Phone: [phone] Fax: [phone]     Social Determinants of Health (SDOH) Interventions    Readmission Risk Interventions Readmission Risk Prevention Plan 01/15/2021 09/11/2020  Transportation Screening Complete Complete  Medication Review ([place]) Complete -  PCP or Specialist appointment within 3-5 days of discharge - Not Complete  PCP/Specialist Appt Not Complete comments - first available appt is 09/26/20  [place] or Home Care Consult Complete (No Data)  SW Recovery Care/Counseling Consult Complete Complete  Palliative Care Screening Not Applicable Not [place] Not Applicable Not Applicable  Some recent data might be hidden

## 2021-07-04 NOTE — Progress Notes (Signed)
Pt states he does not wish to wear BiPAP this evening. Currently sat 96 on 2L humidified [place]. RT will cont to monitor.

## 2021-07-05 ENCOUNTER — Other Ambulatory Visit (HOSPITAL_COMMUNITY): Payer: Self-pay

## 2021-07-05 LAB — BASIC METABOLIC PANEL
Anion gap: 14 (ref 5–15)
BUN: 32 mg/dL — ABNORMAL HIGH (ref 8–23)
CO2: 22 mmol/L (ref 22–32)
Calcium: 8.7 mg/dL — ABNORMAL LOW (ref 8.9–10.3)
Chloride: 96 mmol/L — ABNORMAL LOW (ref 98–111)
Creatinine, Ser: 4.97 mg/dL — ABNORMAL HIGH (ref 0.61–1.24)
GFR, Estimated: 12 mL/min — ABNORMAL LOW (ref >60)
Glucose, Bld: 104 mg/dL — ABNORMAL HIGH (ref 70–99)
Potassium: 3.9 mmol/L (ref 3.5–5.1)
Sodium: 132 mmol/L — ABNORMAL LOW (ref 135–145)

## 2021-07-05 LAB — HEPATIC FUNCTION PANEL
ALT: 888 U/L — ABNORMAL HIGH (ref 0–44)
AST: 309 U/L — ABNORMAL HIGH (ref 15–41)
Albumin: 2.4 g/dL — ABNORMAL LOW (ref 3.5–5.0)
Alkaline Phosphatase: 118 U/L (ref 38–126)
Bilirubin, Direct: 0.3 mg/dL — ABNORMAL HIGH (ref 0.0–0.2)
Indirect Bilirubin: 0.9 mg/dL (ref 0.3–0.9)
Total Bilirubin: 1.2 mg/dL (ref 0.3–1.2)
Total Protein: 5.9 g/dL — ABNORMAL LOW (ref 6.5–8.1)

## 2021-07-05 LAB — CULTURE, BLOOD (ROUTINE X 2)
Culture: NO GROWTH
Culture: NO GROWTH
Special Requests: ADEQUATE
Special Requests: ADEQUATE

## 2021-07-05 LAB — GLUCOSE, CAPILLARY
Glucose-Capillary: 124 mg/dL — ABNORMAL HIGH (ref 70–99)
Glucose-Capillary: 84 mg/dL (ref 70–99)
Glucose-Capillary: 97 mg/dL (ref 70–99)

## 2021-07-05 MED ORDER — MIDODRINE HCL 10 MG PO TABS: 10.0000 mg | ORAL_TABLET | Freq: Three times a day (TID) | ORAL | 0 refills | Status: AC

## 2021-07-05 MED ORDER — AMOXICILLIN-POT CLAVULANATE 500-125 MG PO TABS: 1.0000 | ORAL_TABLET | Freq: Every evening | ORAL | 0 refills | Status: DC

## 2021-07-05 MED ORDER — APIXABAN 2.5 MG PO TABS: 2.5000 mg | ORAL_TABLET | Freq: Two times a day (BID) | ORAL | 0 refills | Status: DC

## 2021-07-05 MED ORDER — AMOXICILLIN-POT CLAVULANATE 500-125 MG PO TABS
1.0000 | ORAL_TABLET | Freq: Every evening | ORAL | Status: DC
  Filled 2021-07-05: qty 1

## 2021-07-05 MED ORDER — ACETAMINOPHEN 325 MG PO TABS
650.0000 mg | ORAL_TABLET | Freq: Four times a day (QID) | ORAL | Status: DC | PRN
  Administered 2021-07-05: 650 mg via ORAL
  Filled 2021-07-05: qty 2

## 2021-07-05 MED ORDER — PNEUMOCOCCAL VAC POLYVALENT 25 MCG/0.5ML IJ INJ
0.5000 mL | INJECTION | INTRAMUSCULAR | Status: DC
  Filled 2021-07-05: qty 0.5

## 2021-07-05 NOTE — Plan of Care (Signed)

## 2021-07-05 NOTE — Progress Notes (Signed)
Pt. Left upper arm IV occluded. IV removed. Pt. Stated "I'm done getting stuck, I do not want another IV." RN aware.

## 2021-07-05 NOTE — Progress Notes (Signed)
PROGRESS NOTE    Thomas Robinson  XBM:[id number] DOB: Nov 01, 1960 DOA: 06/29/2021 PCP: [name], PA   Chief Complaint  Patient presents with   Shortness of Breath   Weakness   Brief Narrative/Hospital Course:  Thomas Robinson, 60 y.o. male with PMH of chronic hypoxic respiratory failure on 2.5l [place] , chronic diastolic congestive heart failure and ESRD on HD, chronic low back pain, HLD, IDA, RLS, thoracic ascending aortic aneurysm, hypertension who presented with worsening shortness of breath noted to be hypoxic in 70s on room air, with generalized weakness, severe exertional dyspnea with chest discomfort tightness , found to have right-sided pleural effusion with right lower lobe pneumonia, small pericardial effusion, no PE on CTA. He is s/p thoracentesis with transudative fluids.  Was managed in ICU initiated broad-spectrum antibiotics, vasopressors.  Also with hypoglycemia given D10.  Stabilized in ICU, oxygen weaned down to4L O2. Continued on IV zosyn.  Also has elevated LFTs.  Nephro was consulted for dialysis. He was transferred to telemetry service 11/3.    Subjective: Resting comfortably no complaints. Was refusing peripheral IV access, tired of getting stuck Remains on nasal cannula heart rate is stable  Assessment & Plan:  Acute on chronic hypoxic respiratory failure due to pleural effusion and pneumonia Right-sided transudative pleural effusion s/p thoracentesis-likely fluid overload Right lower lobe pneumonia: Pleural fluid transudative initially needing pressor antibiotics pressure support with Levophed. On empiric Zosyn for pneumonia.Blood cx 10/30- NGTD, Pleural fluid culture no growth.  Still needing oxygen continue to wean Patient reports he uses 2.5 L nasal cannula at home. Recent Labs  Lab 06/30/21 0250 06/30/21 0658 07/01/21 0255 07/01/21 1329 07/01/21 1335 07/02/21 0420 07/03/21 0412 07/04/21 0249  WBC  --  4.3  --  7.4  --  7.2 7.4 9.6  LATICACIDVEN  0.7 0.8  --   --  2.6*  --   --   --   PROCALCITON  --  0.96 1.36  --   --  6.55  --   --    Consult Septic shock due to pneumonia: Needing pressor support in ICU.  Resolved.  On midodrine- try to wean if BP stable- Bp was in 80s at 3 pm 11/3- hold off on decreasing today.   A flutter new diagnosis - has been in a flutter and cardiology consulted-11/3.  Given hypotension) needing midodrine rate control may be difficult, tolerating low-dose metoprolol started 11/3.  Starting Eliquis 2.5 mg renal dosing, may need TEE/DCCV once he recovers from acute illness.  Cardiac consult appreciated.  TSH slightly up but normal free T4.  Chronic diastolic CHF.  Fluid Management per HD  Acute liver injury likely from right-sided heart failure/sepsis Liver dysfunction Coagulopathy with elevated INR 2.1 suspect from liver dysfunction: Liver ultrasound gallbladder sludge without cholelithiasis, has hepatic steatosis. LFT  ast/alt/alp 2100/14-5/78> level is downtrending/monitor LFTs and INR.  Recent Labs  Lab 06/30/21 0258 07/01/21 1329 07/02/21 0420 07/02/21 1526 07/02/21 1600 07/03/21 0409 07/03/21 0412 07/03/21 1331 07/04/21 0249 07/04/21 1841 07/05/21 0614  AST  --  74*  --   --   --  2,105*  --  1,389* 738*  --  309*  ALT  --  29  --   --   --  1,405*  --  1,296* 1,130*  --  888*  ALKPHOS  --  73  --   --   --  78  --  79 85  --  118  BILITOT  --  1.2  --   --   --  1.1  --  1.2 1.2  --  1.2  PROT  --  6.3*  --  5.8*  --  5.7*  --  5.4* 5.5*  --  5.9*  ALBUMIN  --  2.7*   < >  --  2.3* 2.3*  --  2.2* 2.2*  --  2.4*  INR 1.3* 1.6*  --   --   --   --  2.1*  --   --  1.5*  --    < > = values in this interval not displayed.    ESRD and HD MWF: Last HD 11/3.  Continue HD per nephrology on board.  Appreciate input.   Anemia of chronic renal disease: hb stable.  B12 and folate.  Monitor Metabolic bone disease: Continue Renvela and Sensipar.  Possible adrenal crisis resolved   Hypoglycemia/hypothermia needing D10-could be due to liver dysfunction Patient on D10 .Solu-Cortef and has been discontinued.  Cut the D10 to half and discontinue in couple of hours as blood sugar stable.  Increase oral intake.  Recent Labs  Lab 07/04/21 1705 07/04/21 2040 07/04/21 2336 07/05/21 0423 07/05/21 0814  GLUCAP 85 137* 103* 124* 84    BMI borderline at 19.  Encourage oral intake Pressure injury of coccyx stage II POA as below Pressure Injury 07/01/21 Coccyx Mid Stage 2 -  Partial thickness loss of dermis presenting as a shallow open injury with a red, pink wound bed without slough. (Active)  07/01/21 1300  Location: Coccyx  Location Orientation: Mid  Staging: Stage 2 -  Partial thickness loss of dermis presenting as a shallow open injury with a red, pink wound bed without slough.  Wound Description (Comments):   Present on Admission: Yes    DVT prophylaxis: apixaban (ELIQUIS) tablet 2.5 mg Start: 07/04/21 2045 SCDs Start: 06/30/21 0405 Code Status:   Code Status: Full Code Family Communication: plan of care discussed with patient at bedside. Status is: Inpatient  Remains inpatient appropriate because: For ongoing management of his respiratory failure Patient is currently not medically stable for discharge Anticipate discharge in next 2 to 3 days  Objective: Vitals last 24 hrs: Vitals:   07/04/21 2026 07/04/21 2333 07/05/21 0414 07/05/21 0811  BP: (!) 133/106 108/77 (!) 145/77 137/83  Pulse: 99 88 99   Resp: 15 13 15 19   Temp: 98.1 F (36.7 C) 98 F (36.7 C) 97.8 F (36.6 C) 97.6 F (36.4 C)  TempSrc: Oral  Axillary Axillary  SpO2: 94% 99% 100%   Weight:       Weight change:   Intake/Output Summary (Last 24 hours) at 07/05/2021 1017 Last data filed at 07/04/2021 2119 Gross per 24 hour  Intake 243 ml  Output 1772 ml  Net -1529 ml   Net IO Since Admission: 936.15 mL [07/05/21 1017]   Physical Examination: General exam: AAOx 3, weak older than stated  age on nasal cannula. HEENT:Oral mucosa moist, Ear/Nose WNL grossly, dentition normal. Respiratory system: bilaterally diminished, , no use of accessory muscle Cardiovascular system: S1 & S2 +, No JVD,. Gastrointestinal system: Abdomen soft,NT,ND, BS+ Nervous System:Alert, awake, moving extremities and grossly nonfocal Extremities: No edema, distal peripheral pulses palpable.  Skin: No rashes,no icterus. MSK: Normal muscle bulk,tone, power   Medications reviewed:  Scheduled Meds:  albuterol  2.5 mg Nebulization BID   apixaban  2.5 mg Oral BID   budesonide (PULMICORT) nebulizer solution  0.25 mg Nebulization BID   Chlorhexidine Gluconate Cloth  6 each Topical Q0600   cinacalcet  60 mg Oral QPM   feeding supplement (NEPRO CARB STEADY)  237 mL Oral TID BM   ferric citrate  420 mg Oral TID WC   mouth rinse  15 mL Mouth Rinse BID   melatonin  3 mg Oral QHS   metoprolol tartrate  12.5 mg Oral BID   midodrine  10 mg Oral TID WC   multivitamin  1 tablet Oral Daily   pantoprazole (PROTONIX) IV  40 mg Intravenous QHS   pneumococcal 23 valent vaccine  0.5 mL Intramuscular Tomorrow-1000   sevelamer carbonate  1,600 mg Oral TID WC   sodium chloride flush  10-40 mL Intracatheter Q12H   Continuous Infusions:  sodium chloride     dextrose 30 mL/hr at 07/05/21 0832   norepinephrine (LEVOPHED) Adult infusion Stopped (07/03/21 0825)   ondansetron (ZOFRAN) IV Stopped (07/02/21 1847)   piperacillin-tazobactam (ZOSYN)  IV 2.25 g (07/05/21 0829)   Pressure Injury 07/01/21 Coccyx Mid Stage 2 -  Partial thickness loss of dermis presenting as a shallow open injury with a red, pink wound bed without slough. (Active)  07/01/21 1300  Location: Coccyx  Location Orientation: Mid  Staging: Stage 2 -  Partial thickness loss of dermis presenting as a shallow open injury with a red, pink wound bed without slough.  Wound Description (Comments):   Present on Admission: Yes    Diet Order             Diet  renal/carb modified with fluid restriction Diet-HS Snack? Nothing; Fluid restriction: 1200 mL Fluid; Room service appropriate? Yes; Fluid consistency: Thin  Diet effective now                          Weight change:   Wt Readings from Last 3 Encounters:  07/04/21 57.8 kg  05/31/21 59.4 kg  05/19/21 58.7 kg     Consultants:see note  Procedures:see note Antimicrobials: Anti-infectives (From admission, onward)    Start     Dose/Rate Route Frequency Ordered Stop   07/03/21 0100  piperacillin-tazobactam (ZOSYN) IVPB 2.25 g        2.25 g 100 mL/hr over 30 Minutes Intravenous Every 8 hours 07/02/21 2204     07/01/21 2300  piperacillin-tazobactam (ZOSYN) IVPB 3.375 g  Status:  Discontinued        3.375 g 100 mL/hr over 30 Minutes Intravenous Every 6 hours 07/01/21 1712 07/02/21 2204   06/30/21 1000  linezolid (ZYVOX) IVPB 600 mg  Status:  Discontinued        600 mg 300 mL/hr over 60 Minutes Intravenous Every 12 hours 06/30/21 0410 07/01/21 1034   06/30/21 0600  piperacillin-tazobactam (ZOSYN) IVPB 2.25 g  Status:  Discontinued        2.25 g 100 mL/hr over 30 Minutes Intravenous Every 8 hours 06/30/21 0422 07/01/21 1712   06/30/21 0230  ceFEPIme (MAXIPIME) 2 g in sodium chloride 0.9 % 100 mL IVPB        2 g 200 mL/hr over 30 Minutes Intravenous  Once 06/30/21 0216 06/30/21 0352      Culture/Microbiology    Component Value Date/Time   SDES FLUID PLEURAL 07/02/2021 1518   [place] 07/02/2021 1518   CULT  07/02/2021 1518    NO GROWTH 3 DAYS Performed at [facility] [address], [place], [place] [address]    REPTSTATUS PENDING 07/02/2021 1518    Other culture-see note  Unresulted Labs (From admission, onward)  Start     Ordered   07/06/21 4008  Basic metabolic panel  Tomorrow morning,   R       Question:  Specimen collection method  Answer:  Lab=Lab collect   07/05/21 0549   07/06/21 0500  CBC  Tomorrow morning,   R       Question:  Specimen  collection method  Answer:  Lab=Lab collect   07/05/21 0549   07/05/21 0500  Comprehensive metabolic panel  Daily,   R     Question:  Specimen collection method  Answer:  Unit=Unit collect   07/04/21 1125   07/05/21 0500  Protime-INR  Daily,   R     Question:  Specimen collection method  Answer:  Unit=Unit collect   07/04/21 1128   07/04/21 2012  Immunofixation, urine  Once,   R        07/04/21 2011   07/03/21 0500  CBC  Daily,   R     Question:  Specimen collection method  Answer:  Unit=Unit collect   07/02/21 1416   07/01/21 1021  Legionella Pneumophila Serogp 1 Ur Ag  Once,   R        07/01/21 1034   07/01/21 1021  Urine rapid drug screen (hosp performed)  ONCE - STAT,   STAT        07/01/21 1034   07/01/21 1021  Strep pneumoniae urinary antigen  Once,   R        07/01/21 1034   06/30/21 0407  Culture, Respiratory w Gram Stain (tracheal aspirate)  Once,   R       Question:  Patient immune status  Answer:  Normal   06/30/21 0410   06/30/21 0407  Urinalysis, Routine w reflex microscopic Urine, Clean Catch  Once,   R        06/30/21 0410   06/29/21 2239  Urinalysis, Routine w reflex microscopic  Once,   R        06/29/21 2238          Data Reviewed: I have personally reviewed following labs and imaging studies CBC: Recent Labs  Lab 06/29/21 2237 06/30/21 0658 07/01/21 1329 07/01/21 1607 07/02/21 0420 07/03/21 0412 07/04/21 0249  WBC 4.4 4.3 7.4  --  7.2 7.4 9.6  NEUTROABS 3.6  --   --   --   --   --   --   HGB 9.8* 8.7* 10.4* 9.9* 9.1* 9.1* 9.9*  HCT 30.5* 27.4* 33.1* 29.0* 28.1* 28.9* 29.6*  MCV 104.1* 104.2* 104.4*  --  101.8* 101.8* 98.3  PLT 181 131* 130*  --  122* 99* 676*   Basic Metabolic Panel: Recent Labs  Lab 06/30/21 0658 07/01/21 1329 07/02/21 0420 07/02/21 1600 07/03/21 0409 07/03/21 1331 07/04/21 0249 07/05/21 0614  NA 137   < > 129* 131* 129* 131* 133* 132*  K 4.2   < > 4.7 4.8 5.3* 5.4* 5.5* 3.9  CL 98   < > 93* 97* 96* 98 99 96*  CO2 24    < > 17* 21* 18* 19* 19* 22  GLUCOSE 90   < > 119* 158* 120* 147* 123* 104*  BUN 61*   < > 62* 39* 45* 51* 58* 32*  CREATININE 7.63*   < > 7.90* 5.52* 6.46* 6.78* 7.22* 4.97*  CALCIUM 9.2   < > 8.0* 8.3* 8.4* 8.3* 8.5* 8.7*  MG 2.4  --  2.6*  --   --   --   --   --  PHOS  --   --  8.1* 5.3*  --   --   --   --    < > = values in this interval not displayed.   GFR: Estimated Creatinine Clearance: 12.6 mL/min (A) (by C-G formula based on SCr of 4.97 mg/dL (H)). Liver Function Tests: Recent Labs  Lab 07/01/21 1329 07/02/21 0420 07/02/21 1526 07/02/21 1600 07/03/21 0409 07/03/21 1331 07/04/21 0249 07/05/21 0614  AST 74*  --   --   --  2,105* 1,389* 738* 309*  ALT 29  --   --   --  1,405* 1,296* 1,130* 888*  ALKPHOS 73  --   --   --  78 79 85 118  BILITOT 1.2  --   --   --  1.1 1.2 1.2 1.2  PROT 6.3*  --  5.8*  --  5.7* 5.4* 5.5* 5.9*  ALBUMIN 2.7*   < >  --  2.3* 2.3* 2.2* 2.2* 2.4*   < > = values in this interval not displayed.   Recent Labs  Lab 07/01/21 1329  LIPASE 44  AMYLASE 93   No results for input(s): AMMONIA in the last 168 hours. Coagulation Profile: Recent Labs  Lab 06/30/21 0258 07/01/21 1329 07/03/21 0412 07/04/21 1841  INR 1.3* 1.6* 2.1* 1.5*   Cardiac Enzymes: Recent Labs  Lab 07/03/21 1031  CKTOTAL 30*   BNP (last 3 results) No results for input(s): PROBNP in the last 8760 hours. HbA1C: No results for input(s): HGBA1C in the last 72 hours.  CBG: Recent Labs  Lab 07/04/21 1705 07/04/21 2040 07/04/21 2336 07/05/21 0423 07/05/21 0814  GLUCAP 85 137* 103* 124* 84   Lipid Profile: No results for input(s): CHOL, HDL, LDLCALC, TRIG, CHOLHDL, LDLDIRECT in the last 72 hours. Thyroid Function Tests: Recent Labs    07/04/21 1841 07/04/21 1941  TSH 6.693*  --   FREET4  --  0.92   Anemia Panel: No results for input(s): VITAMINB12, FOLATE, FERRITIN, TIBC, IRON, RETICCTPCT in the last 72 hours. Sepsis Labs: Recent Labs  Lab 06/30/21 0250  06/30/21 0658 07/01/21 0255 07/01/21 1335 07/02/21 0420  PROCALCITON  --  0.96 1.36  --  6.55  LATICACIDVEN 0.7 0.8  --  2.6*  --     Recent Results (from the past 240 hour(s))  Resp Panel by RT-PCR (Flu A&B, Covid) Nasopharyngeal Swab     Status: None   Collection Time: 06/29/21  9:56 PM   Specimen: Nasopharyngeal Swab; Nasopharyngeal(NP) swabs in vial transport medium  Result Value Ref Range Status   SARS Coronavirus 2 by RT PCR NEGATIVE NEGATIVE Final    Comment: (NOTE) SARS-CoV-2 target nucleic acids are NOT DETECTED.  The SARS-CoV-2 RNA is generally detectable in upper respiratory specimens during the acute phase of infection. The lowest concentration of SARS-CoV-2 viral copies this assay can detect is 138 copies/mL. A negative result does not preclude SARS-Cov-2 infection and should not be used as the sole basis for treatment or other patient management decisions. A negative result may occur with  improper specimen collection/handling, submission of specimen other than nasopharyngeal swab, presence of viral mutation(s) within the areas targeted by this assay, and inadequate number of viral copies(<138 copies/mL). A negative result must be combined with clinical observations, patient history, and epidemiological information. The expected result is Negative.  Fact Sheet for Patients:  EntrepreneurPulse.com.au  Fact Sheet for Healthcare Providers:  IncredibleEmployment.be  This test is no t yet approved or cleared by the [place]  FDA and  has been authorized for detection and/or diagnosis of SARS-CoV-2 by FDA under an Emergency Use Authorization (EUA). This EUA will remain  in effect (meaning this test can be used) for the duration of the COVID-19 declaration under Section 564(b)(1) of the Act, 21 U.S.C.section 360bbb-3(b)(1), unless the authorization is terminated  or revoked sooner.       Influenza A by PCR NEGATIVE NEGATIVE  Final   Influenza B by PCR NEGATIVE NEGATIVE Final    Comment: (NOTE) The Xpert Xpress SARS-CoV-2/FLU/RSV plus assay is intended as an aid in the diagnosis of influenza from Nasopharyngeal swab specimens and should not be used as a sole basis for treatment. Nasal washings and aspirates are unacceptable for Xpert Xpress SARS-CoV-2/FLU/RSV testing.  Fact Sheet for Patients: EntrepreneurPulse.com.au  Fact Sheet for Healthcare Providers: IncredibleEmployment.be  This test is not yet approved or cleared by the [place] FDA and has been authorized for detection and/or diagnosis of SARS-CoV-2 by FDA under an Emergency Use Authorization (EUA). This EUA will remain in effect (meaning this test can be used) for the duration of the COVID-19 declaration under Section 564(b)(1) of the Act, 21 U.S.C. section 360bbb-3(b)(1), unless the authorization is terminated or revoked.  Performed at [facility], [place] [address], [place], [place] [address]   Blood culture (routine x 2)     Status: None (Preliminary result)   Collection Time: 06/30/21  3:52 AM   Specimen: BLOOD  Result Value Ref Range Status   Specimen Description   Final    BLOOD BLOOD LEFT FOREARM Performed at [facility] [address], [place], [place] [address]    Special Requests   Final    BOTTLES DRAWN AEROBIC AND ANAEROBIC Blood Culture adequate volume Performed at [facility] [address], [place], [place] [address]    Culture   Final    NO GROWTH 4 DAYS Performed at [facility], [place] [address], [place], [place] [address]    Report Status PENDING  Incomplete  MRSA Next Gen by PCR, Nasal     Status: None   Collection Time: 06/30/21  5:17 AM   Specimen: Nasal Mucosa; Nasal Swab  Result Value Ref Range Status   MRSA by PCR Next Gen NOT DETECTED NOT DETECTED Final    Comment: (NOTE) The GeneXpert MRSA Assay (FDA  approved for NASAL specimens only), is one component of a comprehensive MRSA colonization surveillance program. It is not intended to diagnose MRSA infection nor to guide or monitor treatment for MRSA infections. Test performance is not FDA approved in patients less than 36 years old. Performed at [facility], [place] [address], [place], [place] [address]   Blood culture (routine x 2)     Status: None (Preliminary result)   Collection Time: 06/30/21  6:58 AM   Specimen: BLOOD  Result Value Ref Range Status   Specimen Description   Final    BLOOD BLOOD LEFT HAND Performed at [facility] [address], [place], [place] [address]    Special Requests   Final    BOTTLES DRAWN AEROBIC AND ANAEROBIC Blood Culture adequate volume Performed at [facility] [address] [place], [place] [address]    Culture   Final    NO GROWTH 4 DAYS Performed at [facility], [place] [address] [place],  [address]    Report Status PENDING  Incomplete  Respiratory (~20 pathogens) panel by  PCR     Status: None   Collection Time: 07/01/21 12:01 PM   Specimen: Nasopharyngeal Swab; Respiratory  Result Value Ref Range Status   Adenovirus NOT DETECTED NOT DETECTED Final   Coronavirus 229E NOT DETECTED NOT DETECTED Final    Comment: (NOTE) The Coronavirus on the Respiratory Panel, DOES NOT test for the novel  Coronavirus (2019 nCoV)    Coronavirus HKU1 NOT DETECTED NOT DETECTED Final   Coronavirus NL63 NOT DETECTED NOT DETECTED Final   Coronavirus OC43 NOT DETECTED NOT DETECTED Final   Metapneumovirus NOT DETECTED NOT DETECTED Final   Rhinovirus / Enterovirus NOT DETECTED NOT DETECTED Final   Influenza A NOT DETECTED NOT DETECTED Final   Influenza B NOT DETECTED NOT DETECTED Final   Parainfluenza Virus 1 NOT DETECTED NOT DETECTED Final   Parainfluenza Virus 2 NOT DETECTED NOT DETECTED Final   Parainfluenza Virus 3 NOT DETECTED NOT  DETECTED Final   Parainfluenza Virus 4 NOT DETECTED NOT DETECTED Final   Respiratory Syncytial Virus NOT DETECTED NOT DETECTED Final   Bordetella pertussis NOT DETECTED NOT DETECTED Final   Bordetella Parapertussis NOT DETECTED NOT DETECTED Final   Chlamydophila pneumoniae NOT DETECTED NOT DETECTED Final   Mycoplasma pneumoniae NOT DETECTED NOT DETECTED Final    Comment: Performed at [facility], [place], [place], [place] [address]  Pleural fluid culture w Gram Stain     Status: None (Preliminary result)   Collection Time: 07/02/21  3:18 PM   Specimen: Pleural Fluid  Result Value Ref Range Status   Specimen Description FLUID PLEURAL  Final   Special Requests NONE  Final   Gram Stain   Final    RARE WBC PRESENT,BOTH PMN AND MONONUCLEAR NO ORGANISMS SEEN    Culture   Final    NO GROWTH 3 DAYS Performed at [facility], [place] [address] [place], [place] [address]    Report Status PENDING  Incomplete     Radiology Studies: US Abdomen Limited RUQ (LIVER/GB)  Result Date: 07/04/2021 CLINICAL DATA:  Transaminitis. EXAM: ULTRASOUND ABDOMEN LIMITED RIGHT UPPER QUADRANT COMPARISON:  April 09, 2015 FINDINGS: Gallbladder: A moderate to marked amount of heterogeneous echogenic sludge is noted within the gallbladder lumen. No gallstones are identified. The gallbladder wall measures approximately 3.7 mm in thickness. No sonographic Murphy sign noted by sonographer. Common bile duct: Diameter: 2.4 mm Liver: No focal lesion identified. Diffusely increased echogenicity of the liver parenchyma is noted. Portal vein is patent on color Doppler imaging with normal direction of blood flow towards the liver. Other: A trace amount of right upper quadrant ascites is seen. Of incidental note is the presence of a right-sided pleural effusion. IMPRESSION: 1. Gallbladder sludge without evidence of cholelithiasis. 2. Hepatic steatosis. 3. Trace amount of right upper quadrant ascites. 4. Right pleural  effusion. Electronically Signed   By: Virgina Norfolk M.D.   On: 07/04/2021 19:54     LOS: 5 days   Antonieta Pert, MD [facility]  07/05/2021, 10:17 AM

## 2021-07-05 NOTE — TOC Benefit Eligibility Note (Signed)
Patient Teacher, English as a foreign language completed.    The patient is currently admitted and upon discharge could be taking Eliquis 2.5 mg.  The current 30 day co-pay is, $0.00.   The patient is insured through [place], [place] Patient Advocate Specialist [place] Patient Advocate Team Direct Number: [phone]  Fax: [phone]

## 2021-07-05 NOTE — Progress Notes (Signed)
[facility] Progress Note  Subjective: seen in room, no new c/o's. Standing wt   Vitals:   07/04/21 2026 07/04/21 2333 07/05/21 0414 07/05/21 0811  BP: (!) 133/106 108/77 (!) 145/77 137/83  Pulse: 99 88 99   Resp: 15 13 15 19   Temp: 98.1 F (36.7 C) 98 F (36.7 C) 97.8 F (36.6 C) 97.6 F (36.4 C)  TempSrc: Oral  Axillary Axillary  SpO2: 94% 99% 100%   Weight:        Exam:   alert, nad   no jvd  Chest clear L, R base dc'd BS  Cor reg no RG  Abd soft ntnd no ascites   Ext no LE edema   Alert, NF, ox3   RUA AVF+bruit/ R IJ TDC    Home meds include - norvasc, auryxia 2 ac, breo ellipta, sensipar, lasix 80 on non hd days, hydralazine 50 tid, lopressor 50 bid, rena vit, renvela 2 ac tid        OP HD: MWF AF  4h  400/800  50kg   2/2 bath  Heparin 2000   RIJ TDC/ RUE AVF (maturing)  - sensipar 60 tiw  - venofer 50 weekly  - mircera 225 q2, last 10/19, due 11/2   Assessment/ Plan: PNA bilat/ acute on chronic resp failure - on IV abx and [place] O2 4L. Sp thora 1.5 L 11/1. Improving. Moved to floor. Able to stand and walk.   Shock/ sepsis - resolved. On midodrine 10 tid, BP 118/76 ESRD - on HD MWF. Missed last OP HD. Did CRRT here 10/31 - 11/1. HD yesterday.  Plan HD today to get back on schedule.   Volume - no vol excess, wt's are up, possibly has gained body weight MBD ckd - cont sensipar Anemia ckd - Hb 9-10 range, esa due > gave darbe 200ug x 1 on 11/3 here     Thomas Robinson 07/05/2021, 11:46 AM   Recent Labs  Lab 07/02/21 0420 07/02/21 1600 07/03/21 0409 07/03/21 0412 07/03/21 1331 07/04/21 0249 07/05/21 0614  K 4.7 4.8   < >  --    < > 5.5* 3.9  BUN 62* 39*   < >  --    < > 58* 32*  CREATININE 7.90* 5.52*   < >  --    < > 7.22* 4.97*  CALCIUM 8.0* 8.3*   < >  --    < > 8.5* 8.7*  PHOS 8.1* 5.3*  --   --   --   --   --   HGB 9.1*  --   --  9.1*  --  9.9*  --    < > = values in this interval not displayed.    Inpatient medications:  albuterol   2.5 mg Nebulization BID   apixaban  2.5 mg Oral BID   budesonide (PULMICORT) nebulizer solution  0.25 mg Nebulization BID   Chlorhexidine Gluconate Cloth  6 each Topical Q0600   cinacalcet  60 mg Oral QPM   feeding supplement (NEPRO CARB STEADY)  237 mL Oral TID BM   ferric citrate  420 mg Oral TID WC   mouth rinse  15 mL Mouth Rinse BID   melatonin  3 mg Oral QHS   metoprolol tartrate  12.5 mg Oral BID   midodrine  10 mg Oral TID WC   multivitamin  1 tablet Oral Daily   pantoprazole (PROTONIX) IV  40 mg Intravenous QHS   pneumococcal 23 valent vaccine  0.5 mL Intramuscular Tomorrow-1000   sevelamer carbonate  1,600 mg Oral TID WC   sodium chloride flush  10-40 mL Intracatheter Q12H    sodium chloride     norepinephrine (LEVOPHED) Adult infusion Stopped (07/03/21 0825)   ondansetron (ZOFRAN) IV Stopped (07/02/21 1847)   piperacillin-tazobactam (ZOSYN)  IV 2.25 g (07/04/21 2354)   acetaminophen, albuterol, benzonatate, chlorpheniramine-HYDROcodone, docusate sodium, ondansetron (ZOFRAN) IV, polyethylene glycol, sodium chloride flush

## 2021-07-05 NOTE — Progress Notes (Signed)
Pt left AMA with all belongings. Antonieta Pert, MD aware. Peter Congo, Charge RN aware. Signed AMA form placed in chart.

## 2021-07-05 NOTE — Discharge Summary (Signed)
AMA  Patient at this time expresses desire to leave the Hospital immidiately, patient has been warned that this is not Medically advisable at this time, and can result in Medical complications like Death and Disability, patient understands and accepts the risks involved and assumes full responsibilty of this decision. I made my best effort to convince him to stay , came and spoke to him personally but he refused to stay. I will try to send him his rx to his pharmacy.   Antonieta Pert M.D on 07/05/2021 at 4:06 PM  [facility]  Time < 30 minutes  Last Note Below   PROGRESS NOTE    Thomas Robinson  YJE:[id number] DOB: 1960/09/08 DOA: 06/29/2021 PCP: [name], PA   Chief Complaint  Patient presents with   Shortness of Breath   Weakness   Brief Narrative/Hospital Course:  Thomas Robinson, 60 y.o. male with PMH of chronic hypoxic respiratory failure on 2.5l [place] , chronic diastolic congestive heart failure and ESRD on HD, chronic low back pain, HLD, IDA, RLS, thoracic ascending aortic aneurysm, hypertension who presented with worsening shortness of breath noted to be hypoxic in 70s on room air, with generalized weakness, severe exertional dyspnea with chest discomfort tightness , found to have right-sided pleural effusion with right lower lobe pneumonia, small pericardial effusion, no PE on CTA. He is s/p thoracentesis with transudative fluids.  Was managed in ICU initiated broad-spectrum antibiotics, vasopressors.  Also with hypoglycemia given D10.  Stabilized in ICU, oxygen weaned down to4L O2. Continued on IV zosyn.  Also has elevated LFTs.  Nephro was consulted for dialysis. He was transferred to telemetry service 11/3.    Subjective: Resting comfortably no complaints. Was refusing peripheral IV access, tired of getting stuck Remains on  nasal cannula heart rate is stable  Assessment & Plan:  Acute on chronic hypoxic respiratory failure due to pleural effusion and pneumonia Right-sided transudative pleural effusion s/p thoracentesis-likely fluid overload Right lower lobe pneumonia: Pleural fluid transudative initially needing pressor antibiotics pressure support with Levophed. On empiric Zosyn for pneumonia.Blood cx 10/30- NGTD, Pleural fluid culture no growth.  Still needing oxygen continue to wean Patient reports he uses 2.5 L nasal cannula at home. Recent Labs  Lab 06/30/21 0250 06/30/21 0658 07/01/21 0255 07/01/21 1329 07/01/21 1335 07/02/21 0420 07/03/21 0412 07/04/21 0249  WBC  --  4.3  --  7.4  --  7.2 7.4 9.6  LATICACIDVEN 0.7 0.8  --   --  2.6*  --   --   --   PROCALCITON  --  0.96 1.36  --   --  6.55  --   --    Consult Septic shock due to pneumonia: Needing pressor support in ICU.  Resolved.  On midodrine- try to wean if BP stable- Bp was in 80s at 3 pm 11/3- hold off on decreasing today.   A flutter new diagnosis - has been in a flutter and cardiology consulted-11/3.  Given hypotension) needing midodrine rate control may be difficult, tolerating low-dose metoprolol started 11/3.  Starting Eliquis 2.5 mg renal dosing, may need TEE/DCCV once he recovers from acute illness.  Cardiac consult appreciated.  TSH  slightly up but normal free T4.  Chronic diastolic CHF.  Fluid Management per HD  Acute liver injury likely from right-sided heart failure/sepsis Liver dysfunction Coagulopathy with elevated INR 2.1 suspect from liver dysfunction: Liver ultrasound gallbladder sludge without cholelithiasis, has hepatic steatosis. LFT  ast/alt/alp 2100/14-5/78> level is downtrending/monitor LFTs and INR.  Recent Labs  Lab 06/30/21 0258 07/01/21 1329 07/02/21 0420 07/02/21 1526 07/02/21 1600 07/03/21 0409 07/03/21 0412 07/03/21 1331 07/04/21 0249 07/04/21 1841 07/05/21 0614  AST  --  74*  --   --   --  2,105*   --  1,389* 738*  --  309*  ALT  --  29  --   --   --  1,405*  --  1,296* 1,130*  --  888*  ALKPHOS  --  73  --   --   --  78  --  79 85  --  118  BILITOT  --  1.2  --   --   --  1.1  --  1.2 1.2  --  1.2  PROT  --  6.3*  --  5.8*  --  5.7*  --  5.4* 5.5*  --  5.9*  ALBUMIN  --  2.7*   < >  --  2.3* 2.3*  --  2.2* 2.2*  --  2.4*  INR 1.3* 1.6*  --   --   --   --  2.1*  --   --  1.5*  --    < > = values in this interval not displayed.    ESRD and HD MWF: Last HD 11/3.  Continue HD per nephrology on board.  Appreciate input.   Anemia of chronic renal disease: hb stable.  B12 and folate.  Monitor Metabolic bone disease: Continue Renvela and Sensipar.  Possible adrenal crisis resolved  Hypoglycemia/hypothermia needing D10-could be due to liver dysfunction Patient on D10 .Solu-Cortef and has been discontinued.  Cut the D10 to half and discontinue in couple of hours as blood sugar stable.  Increase oral intake.  Recent Labs  Lab 07/04/21 2040 07/04/21 2336 07/05/21 0423 07/05/21 0814 07/05/21 1209  GLUCAP 137* 103* 124* 84 97    BMI borderline at 19.  Encourage oral intake Pressure injury of coccyx stage II POA as below Pressure Injury 07/01/21 Coccyx Mid Stage 2 -  Partial thickness loss of dermis presenting as a shallow open injury with a red, pink wound bed without slough. (Active)  07/01/21 1300  Location: Coccyx  Location Orientation: Mid  Staging: Stage 2 -  Partial thickness loss of dermis presenting as a shallow open injury with a red, pink wound bed without slough.  Wound Description (Comments):   Present on Admission: Yes    DVT prophylaxis:  Code Status:   Code Status: Full Code Family Communication: plan of care discussed with patient at bedside. Status is: Inpatient  Remains inpatient appropriate because: For ongoing management of his respiratory failure Patient is currently not medically stable for discharge Anticipate discharge in next 2 to 3  days  Objective: Vitals last 24 hrs: Vitals:   07/05/21 0811 07/05/21 1210 07/05/21 1300 07/05/21 1327  BP: 137/83 113/70 126/82   Pulse:   (!) 111   Resp: 19 11    Temp: 97.6 F (36.4 C) 97.7 F (36.5 C) 97.7 F (36.5 C)   TempSrc: Axillary Oral Oral   SpO2:  94% 95% 96%  Weight:       Weight change:   Intake/Output Summary (  Last 24 hours) at 07/05/2021 1606 Last data filed at 07/05/2021 1300 Gross per 24 hour  Intake 723 ml  Output --  Net 723 ml   Net IO Since Admission: 1,416.15 mL [07/05/21 1606]   Physical Examination: General exam: AAOx 3, weak older than stated age on nasal cannula. HEENT:Oral mucosa moist, Ear/Nose WNL grossly, dentition normal. Respiratory system: bilaterally diminished, , no use of accessory muscle Cardiovascular system: S1 & S2 +, No JVD,. Gastrointestinal system: Abdomen soft,NT,ND, BS+ Nervous System:Alert, awake, moving extremities and grossly nonfocal Extremities: No edema, distal peripheral pulses palpable.  Skin: No rashes,no icterus. MSK: Normal muscle bulk,tone, power   Medications reviewed:  Scheduled Meds:  albuterol  2.5 mg Nebulization BID   amoxicillin-clavulanate  1 tablet Oral QPM   apixaban  2.5 mg Oral BID   budesonide (PULMICORT) nebulizer solution  0.25 mg Nebulization BID   Chlorhexidine Gluconate Cloth  6 each Topical Q0600   cinacalcet  60 mg Oral QPM   feeding supplement (NEPRO CARB STEADY)  237 mL Oral TID BM   ferric citrate  420 mg Oral TID WC   mouth rinse  15 mL Mouth Rinse BID   melatonin  3 mg Oral QHS   metoprolol tartrate  12.5 mg Oral BID   midodrine  10 mg Oral TID WC   multivitamin  1 tablet Oral Daily   pantoprazole (PROTONIX) IV  40 mg Intravenous QHS   pneumococcal 23 valent vaccine  0.5 mL Intramuscular Tomorrow-1000   sevelamer carbonate  1,600 mg Oral TID WC   sodium chloride flush  10-40 mL Intracatheter Q12H   Continuous Infusions:  sodium chloride     norepinephrine (LEVOPHED) Adult  infusion Stopped (07/03/21 0825)   ondansetron (ZOFRAN) IV Stopped (07/02/21 1847)   Pressure Injury 07/01/21 Coccyx Mid Stage 2 -  Partial thickness loss of dermis presenting as a shallow open injury with a red, pink wound bed without slough. (Active)  07/01/21 1300  Location: Coccyx  Location Orientation: Mid  Staging: Stage 2 -  Partial thickness loss of dermis presenting as a shallow open injury with a red, pink wound bed without slough.  Wound Description (Comments):   Present on Admission: Yes    Diet Order             Diet renal/carb modified with fluid restriction Diet-HS Snack? Nothing; Fluid restriction: 1200 mL Fluid; Room service appropriate? Yes; Fluid consistency: Thin  Diet effective now                          Weight change:   Wt Readings from Last 3 Encounters:  07/04/21 57.8 kg  05/31/21 59.4 kg  05/19/21 58.7 kg     Consultants:see note  Procedures:see note Antimicrobials: Anti-infectives (From admission, onward)    Start     Dose/Rate Route Frequency Ordered Stop   07/05/21 1800  amoxicillin-clavulanate (AUGMENTIN) 500-125 MG per tablet 500 mg        1 tablet Oral Every evening 07/05/21 1324 07/07/21 1759   07/05/21 0000  amoxicillin-clavulanate (AUGMENTIN) 500-125 MG tablet        1 tablet Oral Every evening 07/05/21 1605 07/08/21 2359   07/03/21 0100  piperacillin-tazobactam (ZOSYN) IVPB 2.25 g  Status:  Discontinued        2.25 g 100 mL/hr over 30 Minutes Intravenous Every 8 hours 07/02/21 2204 07/05/21 1324   07/01/21 2300  piperacillin-tazobactam (ZOSYN) IVPB 3.375 g  Status:  Discontinued  3.375 g 100 mL/hr over 30 Minutes Intravenous Every 6 hours 07/01/21 1712 07/02/21 2204   06/30/21 1000  linezolid (ZYVOX) IVPB 600 mg  Status:  Discontinued        600 mg 300 mL/hr over 60 Minutes Intravenous Every 12 hours 06/30/21 0410 07/01/21 1034   06/30/21 0600  piperacillin-tazobactam (ZOSYN) IVPB 2.25 g  Status:  Discontinued         2.25 g 100 mL/hr over 30 Minutes Intravenous Every 8 hours 06/30/21 0422 07/01/21 1712   06/30/21 0230  ceFEPIme (MAXIPIME) 2 g in sodium chloride 0.9 % 100 mL IVPB        2 g 200 mL/hr over 30 Minutes Intravenous  Once 06/30/21 0216 06/30/21 0352      Culture/Microbiology    Component Value Date/Time   SDES FLUID PLEURAL 07/02/2021 1518   [place] 07/02/2021 1518   CULT  07/02/2021 1518    NO GROWTH 3 DAYS Performed at [facility] [address], [place], [place] [address]    REPTSTATUS PENDING 07/02/2021 1518    Other culture-see note  Unresulted Labs (From admission, onward)     Start     Ordered   07/06/21 2725  Basic metabolic panel  Tomorrow morning,   R       Question:  Specimen collection method  Answer:  Lab=Lab collect   07/05/21 0549   07/06/21 0500  CBC  Tomorrow morning,   R       Question:  Specimen collection method  Answer:  Lab=Lab collect   07/05/21 0549   07/05/21 0500  Comprehensive metabolic panel  Daily,   R     Question:  Specimen collection method  Answer:  Unit=Unit collect   07/04/21 1125   07/05/21 0500  Protime-INR  Daily,   R     Question:  Specimen collection method  Answer:  Unit=Unit collect   07/04/21 1128   07/04/21 2012  Immunofixation, urine  Once,   R        07/04/21 2011   07/03/21 0500  CBC  Daily,   R     Question:  Specimen collection method  Answer:  Unit=Unit collect   07/02/21 1416   07/01/21 1021  Legionella Pneumophila Serogp 1 Ur Ag  Once,   R        07/01/21 1034   07/01/21 1021  Urine rapid drug screen (hosp performed)  ONCE - STAT,   STAT        07/01/21 1034   07/01/21 1021  Strep pneumoniae urinary antigen  Once,   R        07/01/21 1034   06/30/21 0407  Culture, Respiratory w Gram Stain (tracheal aspirate)  Once,   R       Question:  Patient immune status  Answer:  Normal   06/30/21 0410   06/30/21 0407  Urinalysis, Routine w reflex microscopic Urine, Clean Catch  Once,   R        06/30/21 0410    06/29/21 2239  Urinalysis, Routine w reflex microscopic  Once,   R        06/29/21 2238          Data Reviewed: I have personally reviewed following labs and imaging studies CBC: Recent Labs  Lab 06/29/21 2237 06/30/21 0658 07/01/21 1329 07/01/21 1607 07/02/21 0420 07/03/21 0412 07/04/21 0249  WBC 4.4 4.3 7.4  --  7.2 7.4 9.6  NEUTROABS 3.6  --   --   --   --   --   --  HGB 9.8* 8.7* 10.4* 9.9* 9.1* 9.1* 9.9*  HCT 30.5* 27.4* 33.1* 29.0* 28.1* 28.9* 29.6*  MCV 104.1* 104.2* 104.4*  --  101.8* 101.8* 98.3  PLT 181 131* 130*  --  122* 99* 604*   Basic Metabolic Panel: Recent Labs  Lab 06/30/21 0658 07/01/21 1329 07/02/21 0420 07/02/21 1600 07/03/21 0409 07/03/21 1331 07/04/21 0249 07/05/21 0614  NA 137   < > 129* 131* 129* 131* 133* 132*  K 4.2   < > 4.7 4.8 5.3* 5.4* 5.5* 3.9  CL 98   < > 93* 97* 96* 98 99 96*  CO2 24   < > 17* 21* 18* 19* 19* 22  GLUCOSE 90   < > 119* 158* 120* 147* 123* 104*  BUN 61*   < > 62* 39* 45* 51* 58* 32*  CREATININE 7.63*   < > 7.90* 5.52* 6.46* 6.78* 7.22* 4.97*  CALCIUM 9.2   < > 8.0* 8.3* 8.4* 8.3* 8.5* 8.7*  MG 2.4  --  2.6*  --   --   --   --   --   PHOS  --   --  8.1* 5.3*  --   --   --   --    < > = values in this interval not displayed.   GFR: Estimated Creatinine Clearance: 12.6 mL/min (A) (by C-G formula based on SCr of 4.97 mg/dL (H)). Liver Function Tests: Recent Labs  Lab 07/01/21 1329 07/02/21 0420 07/02/21 1526 07/02/21 1600 07/03/21 0409 07/03/21 1331 07/04/21 0249 07/05/21 0614  AST 74*  --   --   --  2,105* 1,389* 738* 309*  ALT 29  --   --   --  1,405* 1,296* 1,130* 888*  ALKPHOS 73  --   --   --  78 79 85 118  BILITOT 1.2  --   --   --  1.1 1.2 1.2 1.2  PROT 6.3*  --  5.8*  --  5.7* 5.4* 5.5* 5.9*  ALBUMIN 2.7*   < >  --  2.3* 2.3* 2.2* 2.2* 2.4*   < > = values in this interval not displayed.   Recent Labs  Lab 07/01/21 1329  LIPASE 44  AMYLASE 93   No results for input(s): AMMONIA in the last  168 hours. Coagulation Profile: Recent Labs  Lab 06/30/21 0258 07/01/21 1329 07/03/21 0412 07/04/21 1841  INR 1.3* 1.6* 2.1* 1.5*   Cardiac Enzymes: Recent Labs  Lab 07/03/21 1031  CKTOTAL 30*   BNP (last 3 results) No results for input(s): PROBNP in the last 8760 hours. HbA1C: No results for input(s): HGBA1C in the last 72 hours.  CBG: Recent Labs  Lab 07/04/21 2040 07/04/21 2336 07/05/21 0423 07/05/21 0814 07/05/21 1209  GLUCAP 137* 103* 124* 84 97   Lipid Profile: No results for input(s): CHOL, HDL, LDLCALC, TRIG, CHOLHDL, LDLDIRECT in the last 72 hours. Thyroid Function Tests: Recent Labs    07/04/21 1841 07/04/21 1941  TSH 6.693*  --   FREET4  --  0.92   Anemia Panel: No results for input(s): VITAMINB12, FOLATE, FERRITIN, TIBC, IRON, RETICCTPCT in the last 72 hours. Sepsis Labs: Recent Labs  Lab 06/30/21 0250 06/30/21 0658 07/01/21 0255 07/01/21 1335 07/02/21 0420  PROCALCITON  --  0.96 1.36  --  6.55  LATICACIDVEN 0.7 0.8  --  2.6*  --     Recent Results (from the past 240 hour(s))  Resp Panel by RT-PCR (Flu A&B, Covid) Nasopharyngeal Swab  Status: None   Collection Time: 06/29/21  9:56 PM   Specimen: Nasopharyngeal Swab; Nasopharyngeal(NP) swabs in vial transport medium  Result Value Ref Range Status   SARS Coronavirus 2 by RT PCR NEGATIVE NEGATIVE Final    Comment: (NOTE) SARS-CoV-2 target nucleic acids are NOT DETECTED.  The SARS-CoV-2 RNA is generally detectable in upper respiratory specimens during the acute phase of infection. The lowest concentration of SARS-CoV-2 viral copies this assay can detect is 138 copies/mL. A negative result does not preclude SARS-Cov-2 infection and should not be used as the sole basis for treatment or other patient management decisions. A negative result may occur with  improper specimen collection/handling, submission of specimen other than nasopharyngeal swab, presence of viral mutation(s) within  the areas targeted by this assay, and inadequate number of viral copies(<138 copies/mL). A negative result must be combined with clinical observations, patient history, and epidemiological information. The expected result is Negative.  Fact Sheet for Patients:  EntrepreneurPulse.com.au  Fact Sheet for Healthcare Providers:  IncredibleEmployment.be  This test is no t yet approved or cleared by the [place] FDA and  has been authorized for detection and/or diagnosis of SARS-CoV-2 by FDA under an Emergency Use Authorization (EUA). This EUA will remain  in effect (meaning this test can be used) for the duration of the COVID-19 declaration under Section 564(b)(1) of the Act, 21 U.S.C.section 360bbb-3(b)(1), unless the authorization is terminated  or revoked sooner.       Influenza A by PCR NEGATIVE NEGATIVE Final   Influenza B by PCR NEGATIVE NEGATIVE Final    Comment: (NOTE) The Xpert Xpress SARS-CoV-2/FLU/RSV plus assay is intended as an aid in the diagnosis of influenza from Nasopharyngeal swab specimens and should not be used as a sole basis for treatment. Nasal washings and aspirates are unacceptable for Xpert Xpress SARS-CoV-2/FLU/RSV testing.  Fact Sheet for Patients: EntrepreneurPulse.com.au  Fact Sheet for Healthcare Providers: IncredibleEmployment.be  This test is not yet approved or cleared by the [place] FDA and has been authorized for detection and/or diagnosis of SARS-CoV-2 by FDA under an Emergency Use Authorization (EUA). This EUA will remain in effect (meaning this test can be used) for the duration of the COVID-19 declaration under Section 564(b)(1) of the Act, 21 U.S.C. section 360bbb-3(b)(1), unless the authorization is terminated or revoked.  Performed at [facility], [place] [address], [place], [place] [address]   Blood culture (routine x 2)     Status:  None   Collection Time: 06/30/21  3:52 AM   Specimen: BLOOD  Result Value Ref Range Status   Specimen Description   Final    BLOOD BLOOD LEFT FOREARM Performed at [facility] [address] [place], [place] [address]    Special Requests   Final    BOTTLES DRAWN AEROBIC AND ANAEROBIC Blood Culture adequate volume Performed at [facility] [address] [place], [place] [address]    Culture   Final    NO GROWTH 5 DAYS Performed at [facility], [place] [address], [place], [place] [address]    Report Status 07/05/2021 FINAL  Final  MRSA Next Gen by PCR, Nasal     Status: None   Collection Time: 06/30/21  5:17 AM   Specimen: Nasal Mucosa; Nasal Swab  Result Value Ref Range Status   MRSA by PCR Next Gen NOT DETECTED NOT DETECTED Final    Comment: (NOTE) The GeneXpert MRSA Assay (FDA approved for NASAL specimens only),  is one component of a comprehensive MRSA colonization surveillance program. It is not intended to diagnose MRSA infection nor to guide or monitor treatment for MRSA infections. Test performance is not FDA approved in patients less than 35 years old. Performed at [facility], [place] [address], [place], [place] [address]   Blood culture (routine x 2)     Status: None   Collection Time: 06/30/21  6:58 AM   Specimen: BLOOD  Result Value Ref Range Status   Specimen Description   Final    BLOOD BLOOD LEFT HAND Performed at [facility] [address] [place], [place] [address]    Special Requests   Final    BOTTLES DRAWN AEROBIC AND ANAEROBIC Blood Culture adequate volume Performed at [facility] [address] [place], [place] [address]    Culture   Final    NO GROWTH 5 DAYS Performed at [facility], [place] [address] [place], [place] [address]    Report Status 07/05/2021 FINAL  Final  Respiratory (~20 pathogens) panel by PCR     Status: None    Collection Time: 07/01/21 12:01 PM   Specimen: Nasopharyngeal Swab; Respiratory  Result Value Ref Range Status   Adenovirus NOT DETECTED NOT DETECTED Final   Coronavirus 229E NOT DETECTED NOT DETECTED Final    Comment: (NOTE) The Coronavirus on the Respiratory Panel, DOES NOT test for the novel  Coronavirus (2019 nCoV)    Coronavirus HKU1 NOT DETECTED NOT DETECTED Final   Coronavirus NL63 NOT DETECTED NOT DETECTED Final   Coronavirus OC43 NOT DETECTED NOT DETECTED Final   Metapneumovirus NOT DETECTED NOT DETECTED Final   Rhinovirus / Enterovirus NOT DETECTED NOT DETECTED Final   Influenza A NOT DETECTED NOT DETECTED Final   Influenza B NOT DETECTED NOT DETECTED Final   Parainfluenza Virus 1 NOT DETECTED NOT DETECTED Final   Parainfluenza Virus 2 NOT DETECTED NOT DETECTED Final   Parainfluenza Virus 3 NOT DETECTED NOT DETECTED Final   Parainfluenza Virus 4 NOT DETECTED NOT DETECTED Final   Respiratory Syncytial Virus NOT DETECTED NOT DETECTED Final   Bordetella pertussis NOT DETECTED NOT DETECTED Final   Bordetella Parapertussis NOT DETECTED NOT DETECTED Final   Chlamydophila pneumoniae NOT DETECTED NOT DETECTED Final   Mycoplasma pneumoniae NOT DETECTED NOT DETECTED Final    Comment: Performed at [facility], [place] [address], [place], [place] [address]  Pleural fluid culture w Gram Stain     Status: None (Preliminary result)   Collection Time: 07/02/21  3:18 PM   Specimen: Pleural Fluid  Result Value Ref Range Status   Specimen Description FLUID PLEURAL  Final   Special Requests NONE  Final   Gram Stain   Final    RARE WBC PRESENT,BOTH PMN AND MONONUCLEAR NO ORGANISMS SEEN    Culture   Final    NO GROWTH 3 DAYS Performed at [facility], [place] [address] [place],  [address]    Report Status PENDING  Incomplete     Radiology Studies: US Abdomen Limited RUQ (LIVER/GB)  Result Date: 07/04/2021 CLINICAL DATA:  Transaminitis. EXAM: ULTRASOUND ABDOMEN LIMITED  RIGHT UPPER QUADRANT COMPARISON:  April 09, 2015 FINDINGS: Gallbladder: A moderate to marked amount of heterogeneous echogenic sludge is noted within the gallbladder lumen. No gallstones are identified. The gallbladder wall measures approximately 3.7 mm in thickness. No sonographic Murphy sign noted by sonographer. Common bile duct: Diameter: 2.4 mm Liver: No focal lesion  identified. Diffusely increased echogenicity of the liver parenchyma is noted. Portal vein is patent on color Doppler imaging with normal direction of blood flow towards the liver. Other: A trace amount of right upper quadrant ascites is seen. Of incidental note is the presence of a right-sided pleural effusion. IMPRESSION: 1. Gallbladder sludge without evidence of cholelithiasis. 2. Hepatic steatosis. 3. Trace amount of right upper quadrant ascites. 4. Right pleural effusion. Electronically Signed   By: [place]ina Norfolk M.D.   On: 07/04/2021 19:54     LOS: 5 days   Antonieta Pert, MD [facility]  07/05/2021, 4:06 PM

## 2021-07-05 NOTE — Progress Notes (Signed)
Progress Note  Patient Name: Thomas Robinson Date of Encounter: 07/05/2021  [facility] Cardiologist: [name], DO   Subjective   Denies any CP or significant dyspnea overnight  Inpatient Medications    Scheduled Meds:  albuterol  2.5 mg Nebulization BID   apixaban  2.5 mg Oral BID   budesonide (PULMICORT) nebulizer solution  0.25 mg Nebulization BID   Chlorhexidine Gluconate Cloth  6 each Topical Q0600   cinacalcet  60 mg Oral QPM   feeding supplement (NEPRO CARB STEADY)  237 mL Oral TID BM   ferric citrate  420 mg Oral TID WC   mouth rinse  15 mL Mouth Rinse BID   melatonin  3 mg Oral QHS   metoprolol tartrate  12.5 mg Oral BID   midodrine  10 mg Oral TID WC   multivitamin  1 tablet Oral Daily   pantoprazole (PROTONIX) IV  40 mg Intravenous QHS   pneumococcal 23 valent vaccine  0.5 mL Intramuscular Tomorrow-1000   sevelamer carbonate  1,600 mg Oral TID WC   sodium chloride flush  10-40 mL Intracatheter Q12H   Continuous Infusions:  sodium chloride     dextrose 100 mL/hr at 07/04/21 0705   norepinephrine (LEVOPHED) Adult infusion Stopped (07/03/21 0825)   ondansetron (ZOFRAN) IV Stopped (07/02/21 1847)   piperacillin-tazobactam (ZOSYN)  IV 2.25 g (07/04/21 2354)   PRN Meds: albuterol, benzonatate, chlorpheniramine-HYDROcodone, docusate sodium, ondansetron (ZOFRAN) IV, polyethylene glycol, sodium chloride flush   Vital Signs    Vitals:   07/04/21 1943 07/04/21 2026 07/04/21 2333 07/05/21 0414  BP:  (!) 133/106 108/77 (!) 145/77  Pulse:  99 88 99  Resp:  _0 Temp:  98.1 F (36.7 C) 98 F (36.7 C) 97.8 F (36.6 C)  TempSrc:  Oral  Axillary  SpO2: 97% 94% 99% 100%  Weight:        Intake/Output Summary (Last 24 hours) at 07/05/2021 0802 Last data filed at 07/04/2021 2119 Gross per 24 hour  Intake 243 ml  Output 1772 ml  Net -1529 ml   Last 3 Weights 07/04/2021 07/04/2021 07/02/2021  Weight (lbs) 127 lb 6.8 oz 131 lb 2.8 oz 130 lb 1.1 oz  Weight  (kg) 57.8 kg 59.5 kg 59 kg      Telemetry    Atrial flutter with HR 110s overnight - Personally Reviewed  ECG    Atrial flutter with RVR - Personally Reviewed  Physical Exam   GEN: No acute distress.  Ill appearing Neck: No JVD Cardiac: tachycardic, irregular, no murmurs, rubs, or gallops.  Respiratory: Clear to auscultation bilaterally. GI: Soft, nontender, non-distended  MS: No edema; No deformity. Neuro:  Nonfocal  Psych: Normal affect   Labs    High Sensitivity Troponin:   Recent Labs  Lab 06/29/21 2237 06/30/21 0352 07/01/21 1329 07/02/21 0420  TROPONINIHS 62* 67* 144* 202*     Chemistry Recent Labs  Lab 06/30/21 7106 07/01/21 1329 07/02/21 0420 07/02/21 1526 07/03/21 0409 07/03/21 1331 07/04/21 0249 07/05/21 0614  NA 137   < > 129*   < > 129* 131* 133* 132*  K 4.2   < > 4.7   < > 5.3* 5.4* 5.5* 3.9  CL 98   < > 93*   < > 96* 98 99 96*  CO2 24   < > 17*   < > 18* 19* 19* 22  GLUCOSE 90   < > 119*   < > 120* 147* 123* 104*  BUN 61*   < > 62*   < > 45* 51* 58* 32*  CREATININE 7.63*   < > 7.90*   < > 6.46* 6.78* 7.22* 4.97*  CALCIUM 9.2   < > 8.0*   < > 8.4* 8.3* 8.5* 8.7*  MG 2.4  --  2.6*  --   --   --   --   --   PROT  --    < >  --    < > 5.7* 5.4* 5.5*  --   ALBUMIN  --    < > 2.3*   < > 2.3* 2.2* 2.2*  --   AST  --    < >  --   --  2,105* 1,389* 738*  --   ALT  --    < >  --   --  1,405* 1,296* 1,130*  --   ALKPHOS  --    < >  --   --  78 79 85  --   BILITOT  --    < >  --   --  1.1 1.2 1.2  --   GFRNONAA 7*   < > 7*   < > 9* 9* 8* 12*  ANIONGAP 15   < > 19*   < > _0 < > = values in this interval not displayed.    Lipids No results for input(s): CHOL, TRIG, HDL, LABVLDL, LDLCALC, CHOLHDL in the last 168 hours.  Hematology Recent Labs  Lab 07/02/21 0420 07/03/21 0412 07/04/21 0249  WBC 7.2 7.4 9.6  RBC 2.76* 2.84* 3.01*  HGB 9.1* 9.1* 9.9*  HCT 28.1* 28.9* 29.6*  MCV 101.8* 101.8* 98.3  MCH 33.0 32.0 32.9  MCHC 32.4 31.5  33.4  RDW 17.6* 17.2* 17.2*  PLT 122* 99* 125*   Thyroid  Recent Labs  Lab 07/04/21 1841 07/04/21 1941  TSH 6.693*  --   FREET4  --  0.92    BNPNo results for input(s): BNP, PROBNP in the last 168 hours.  DDimer No results for input(s): DDIMER in the last 168 hours.   Radiology    US Abdomen Limited RUQ (LIVER/GB)  Result Date: 07/04/2021 CLINICAL DATA:  Transaminitis. EXAM: ULTRASOUND ABDOMEN LIMITED RIGHT UPPER QUADRANT COMPARISON:  April 09, 2015 FINDINGS: Gallbladder: A moderate to marked amount of heterogeneous echogenic sludge is noted within the gallbladder lumen. No gallstones are identified. The gallbladder wall measures approximately 3.7 mm in thickness. No sonographic Murphy sign noted by sonographer. Common bile duct: Diameter: 2.4 mm Liver: No focal lesion identified. Diffusely increased echogenicity of the liver parenchyma is noted. Portal vein is patent on color Doppler imaging with normal direction of blood flow towards the liver. Other: A trace amount of right upper quadrant ascites is seen. Of incidental note is the presence of a right-sided pleural effusion. IMPRESSION: 1. Gallbladder sludge without evidence of cholelithiasis. 2. Hepatic steatosis. 3. Trace amount of right upper quadrant ascites. 4. Right pleural effusion. Electronically Signed   By: Virgina Norfolk M.D.   On: 07/04/2021 19:54    Cardiac Studies   Echo 07/02/2021  1. Strain attempted but patient asked to stop. Would recomm limited echo  with strain only.   2. LV cavitiy is small. Turburlent flow through LV. Near caviity  obliteration. . Left ventricular ejection fraction, by estimation, is 70  to 75%. The left ventricle has hyperdynamic function. The left ventricle  has no regional wall motion abnormalities.  There is severe concentric left ventricular hypertrophy. Left ventricular  diastolic function could not be evaluated.   3. Right ventricular systolic function is moderately reduced. The right   ventricular size is mildly enlarged. Mildly increased right ventricular  wall thickness.   4. Left atrial size was severely dilated.   5. Right atrial size was severely dilated.   6. Mild mitral valve regurgitation. Moderate to severe mitral annular  calcification.   7. Tricuspid valve regurgitation is moderate.   8. AV is thickened with restricted motion. Peak and mean gradients  through the valve are 22 and 12 mm hg respectively. Dimensionless gradient  is 0.53 consistent with mild AS 2 D imaging suggests that it is moderate.  No significant change in gradient  from May 2022. Aortic valve regurgitation is mild.   9. Aortic dilatation noted. There is mild dilatation of the ascending  aorta, measuring 40 mm.   Patient Profile     60 y.o. male with PMH of chronic diastolic heart failure, ESRD, CVA, chronic hypoxic respiratory failure, CAD, ascending aortic aneurysm, and HTN presented with RLL PNA and R pleural effusion. Treated with abx and underwent thoracentesis. Found to have new atrial flutter since admission.    Assessment & Plan    Newly diagnosed atrial flutter  - occurred in the setting of PNA  - Echo 07/02/2021 EF 70-75%, severe LVH, moderately reduced RVEF, severe biatrial enlargement, mild MR with moderate to severe MAC, moderate TR, mild AI.   - started on low dose 2.46m BID Eliquis. Rate control limited with low BP that require 140mTID of midodrine.   - continue metoprolol tartrate 12.78m53mID, hopefully as BP improve, will increase metoprolol later. HR overnight still borderline high in the 110s.   - Dr. TobHarriet Masson concerned of compliance. Per Dr. SchGardiner Rhymeikely consider TEE DCCV since recovered from acute illness.  Hyperdynamic LV with severe LVH: MD recommended outpatient PYP scan to rule out infiltrative heart disease.   CAP: on IV zosyn. Hypotension on midodrine.   Elevated transaminase:  - AST 2100 and ALT 1400 on 07/03/2021, by 11/4 AST down to 309 and ALT down to  [id number] diastolic heart failure: appears to be euvolemic on exam. Managed by dialysis  ESRD on HD TThSat  H/o CVA  HTN: off of all home BP meds including amlodipine and hydralazine. On metoprolol tartrate 69m61mD at home, he is currently only able to tolerate 12.78mg 2m of metoprolol      For questions or updates, please contact [facility] consult [email or web address] for contact info under        Signed, Latifa Noble MAlmyra Deforest 1[place]4/2022, 8:02 AM

## 2021-07-06 ENCOUNTER — Telehealth: Payer: Self-pay | Admitting: Nephrology

## 2021-07-06 LAB — BODY FLUID CULTURE W GRAM STAIN: Culture: NO GROWTH

## 2021-07-06 NOTE — Telephone Encounter (Signed)
Transition of care contact from inpatient facility  Date of Discharge: 07/05/21 AMA  Date of Contact: 07/06/21 Method of contact: Phone  Attempted to contact patient to discuss transition of care from inpatient admission. Patient did not answer the phone. Message was left on the patient's voicemail with call back number [phone].

## 2021-07-07 ENCOUNTER — Encounter (HOSPITAL_COMMUNITY): Payer: Self-pay

## 2021-07-07 ENCOUNTER — Emergency Department (HOSPITAL_COMMUNITY): Payer: Medicare Other

## 2021-07-07 ENCOUNTER — Inpatient Hospital Stay (HOSPITAL_COMMUNITY)
Admission: EM | Admit: 2021-07-07 | Discharge: 2021-07-11 | DRG: 377 | Discharge status: Against Medical Advice | Payer: Medicare Other | Attending: Family Medicine | Admitting: Family Medicine

## 2021-07-07 ENCOUNTER — Other Ambulatory Visit: Payer: Self-pay

## 2021-07-07 DIAGNOSIS — Z8673 Personal history of transient ischemic attack (TIA), and cerebral infarction without residual deficits: Secondary | ICD-10-CM

## 2021-07-07 DIAGNOSIS — I469 Cardiac arrest, cause unspecified: Secondary | ICD-10-CM | POA: Diagnosis not present

## 2021-07-07 DIAGNOSIS — K219 Gastro-esophageal reflux disease without esophagitis: Secondary | ICD-10-CM | POA: Diagnosis present

## 2021-07-07 DIAGNOSIS — D631 Anemia in chronic kidney disease: Secondary | ICD-10-CM | POA: Diagnosis present

## 2021-07-07 DIAGNOSIS — K2990 Gastroduodenitis, unspecified, without bleeding: Secondary | ICD-10-CM | POA: Diagnosis not present

## 2021-07-07 DIAGNOSIS — E785 Hyperlipidemia, unspecified: Secondary | ICD-10-CM | POA: Diagnosis present

## 2021-07-07 DIAGNOSIS — K642 Third degree hemorrhoids: Secondary | ICD-10-CM | POA: Diagnosis present

## 2021-07-07 DIAGNOSIS — R9431 Abnormal electrocardiogram [ECG] [EKG]: Secondary | ICD-10-CM

## 2021-07-07 DIAGNOSIS — K644 Residual hemorrhoidal skin tags: Secondary | ICD-10-CM | POA: Diagnosis present

## 2021-07-07 DIAGNOSIS — Z20822 Contact with and (suspected) exposure to covid-19: Secondary | ICD-10-CM | POA: Diagnosis present

## 2021-07-07 DIAGNOSIS — J440 Chronic obstructive pulmonary disease with acute lower respiratory infection: Secondary | ICD-10-CM | POA: Diagnosis present

## 2021-07-07 DIAGNOSIS — M1990 Unspecified osteoarthritis, unspecified site: Secondary | ICD-10-CM | POA: Diagnosis present

## 2021-07-07 DIAGNOSIS — Z881 Allergy status to other antibiotic agents status: Secondary | ICD-10-CM

## 2021-07-07 DIAGNOSIS — R627 Adult failure to thrive: Secondary | ICD-10-CM | POA: Diagnosis present

## 2021-07-07 DIAGNOSIS — I132 Hypertensive heart and chronic kidney disease with heart failure and with stage 5 chronic kidney disease, or end stage renal disease: Secondary | ICD-10-CM | POA: Diagnosis present

## 2021-07-07 DIAGNOSIS — Z7901 Long term (current) use of anticoagulants: Secondary | ICD-10-CM

## 2021-07-07 DIAGNOSIS — N2581 Secondary hyperparathyroidism of renal origin: Secondary | ICD-10-CM | POA: Diagnosis present

## 2021-07-07 DIAGNOSIS — G2581 Restless legs syndrome: Secondary | ICD-10-CM | POA: Diagnosis present

## 2021-07-07 DIAGNOSIS — E860 Dehydration: Secondary | ICD-10-CM | POA: Diagnosis present

## 2021-07-07 DIAGNOSIS — J9621 Acute and chronic respiratory failure with hypoxia: Secondary | ICD-10-CM | POA: Diagnosis present

## 2021-07-07 DIAGNOSIS — I4892 Unspecified atrial flutter: Secondary | ICD-10-CM | POA: Diagnosis present

## 2021-07-07 DIAGNOSIS — E1142 Type 2 diabetes mellitus with diabetic polyneuropathy: Secondary | ICD-10-CM | POA: Diagnosis present

## 2021-07-07 DIAGNOSIS — N186 End stage renal disease: Secondary | ICD-10-CM | POA: Diagnosis present

## 2021-07-07 DIAGNOSIS — Z809 Family history of malignant neoplasm, unspecified: Secondary | ICD-10-CM

## 2021-07-07 DIAGNOSIS — E1122 Type 2 diabetes mellitus with diabetic chronic kidney disease: Secondary | ICD-10-CM | POA: Diagnosis present

## 2021-07-07 DIAGNOSIS — Z992 Dependence on renal dialysis: Secondary | ICD-10-CM

## 2021-07-07 DIAGNOSIS — Z6820 Body mass index (BMI) 20.0-20.9, adult: Secondary | ICD-10-CM

## 2021-07-07 DIAGNOSIS — K760 Fatty (change of) liver, not elsewhere classified: Secondary | ICD-10-CM | POA: Diagnosis present

## 2021-07-07 DIAGNOSIS — I483 Typical atrial flutter: Secondary | ICD-10-CM | POA: Diagnosis present

## 2021-07-07 DIAGNOSIS — J181 Lobar pneumonia, unspecified organism: Secondary | ICD-10-CM | POA: Diagnosis present

## 2021-07-07 DIAGNOSIS — K921 Melena: Principal | ICD-10-CM | POA: Diagnosis present

## 2021-07-07 DIAGNOSIS — Z79899 Other long term (current) drug therapy: Secondary | ICD-10-CM

## 2021-07-07 DIAGNOSIS — K2970 Gastritis, unspecified, without bleeding: Secondary | ICD-10-CM | POA: Diagnosis not present

## 2021-07-07 DIAGNOSIS — I2729 Other secondary pulmonary hypertension: Secondary | ICD-10-CM | POA: Diagnosis present

## 2021-07-07 DIAGNOSIS — L89152 Pressure ulcer of sacral region, stage 2: Secondary | ICD-10-CM | POA: Diagnosis present

## 2021-07-07 DIAGNOSIS — R531 Weakness: Secondary | ICD-10-CM | POA: Diagnosis present

## 2021-07-07 DIAGNOSIS — Z7951 Long term (current) use of inhaled steroids: Secondary | ICD-10-CM

## 2021-07-07 DIAGNOSIS — R1110 Vomiting, unspecified: Secondary | ICD-10-CM

## 2021-07-07 DIAGNOSIS — I5032 Chronic diastolic (congestive) heart failure: Secondary | ICD-10-CM | POA: Diagnosis present

## 2021-07-07 DIAGNOSIS — Z5329 Procedure and treatment not carried out because of patient's decision for other reasons: Secondary | ICD-10-CM | POA: Diagnosis present

## 2021-07-07 DIAGNOSIS — E872 Acidosis, unspecified: Secondary | ICD-10-CM | POA: Diagnosis not present

## 2021-07-07 DIAGNOSIS — D500 Iron deficiency anemia secondary to blood loss (chronic): Secondary | ICD-10-CM | POA: Diagnosis present

## 2021-07-07 DIAGNOSIS — I7121 Aneurysm of the ascending aorta, without rupture: Secondary | ICD-10-CM | POA: Diagnosis present

## 2021-07-07 DIAGNOSIS — E0781 Sick-euthyroid syndrome: Secondary | ICD-10-CM | POA: Diagnosis present

## 2021-07-07 DIAGNOSIS — K3189 Other diseases of stomach and duodenum: Secondary | ICD-10-CM | POA: Diagnosis present

## 2021-07-07 HISTORY — DX: Chronic obstructive pulmonary disease, unspecified: J44.9

## 2021-07-07 LAB — BLOOD GAS, VENOUS
Acid-base deficit: 4.4 mmol/L — ABNORMAL HIGH (ref 0.0–2.0)
Bicarbonate: 18.1 mmol/L — ABNORMAL LOW (ref 20.0–28.0)
O2 Saturation: 99.7 %
Patient temperature: 98.6
pCO2, Ven: 26 mmHg — ABNORMAL LOW (ref 44.0–60.0)
pH, Ven: 7.457 — ABNORMAL HIGH (ref 7.250–7.430)
pO2, Ven: 202 mmHg — ABNORMAL HIGH (ref 32.0–45.0)

## 2021-07-07 LAB — TYPE AND SCREEN
ABO/RH(D): A POS
Antibody Screen: NEGATIVE

## 2021-07-07 LAB — CBC WITH DIFFERENTIAL/PLATELET
Band Neutrophils: 0 %
Basophils Relative: 0 %
Blasts: NONE SEEN %
Eosinophils Relative: 0 %
HCT: 31 % — ABNORMAL LOW (ref 39.0–52.0)
Hemoglobin: 10 g/dL — ABNORMAL LOW (ref 13.0–17.0)
Lymphocytes Relative: 7 %
MCH: 33.8 pg (ref 26.0–34.0)
MCHC: 32.3 g/dL (ref 30.0–36.0)
MCV: 104.7 fL — ABNORMAL HIGH (ref 80.0–100.0)
Metamyelocytes Relative: NONE SEEN %
Monocytes Relative: 3 %
Myelocytes: NONE SEEN %
Neutrophils Relative %: 90 %
Platelets: 123 10*3/uL — ABNORMAL LOW (ref 150–400)
Promyelocytes Relative: NONE SEEN %
RBC Morphology: NORMAL
RBC: 2.96 MIL/uL — ABNORMAL LOW (ref 4.22–5.81)
RDW: 18.8 % — ABNORMAL HIGH (ref 11.5–15.5)
WBC Morphology: NORMAL
WBC: 7.2 10*3/uL (ref 4.0–10.5)
nRBC: 8 /100 WBC — ABNORMAL HIGH

## 2021-07-07 LAB — COMPREHENSIVE METABOLIC PANEL
ALT: 496 U/L — ABNORMAL HIGH (ref 0–44)
AST: 102 U/L — ABNORMAL HIGH (ref 15–41)
Albumin: 3.1 g/dL — ABNORMAL LOW (ref 3.5–5.0)
Alkaline Phosphatase: 159 U/L — ABNORMAL HIGH (ref 38–126)
Anion gap: 16 — ABNORMAL HIGH (ref 5–15)
BUN: 58 mg/dL — ABNORMAL HIGH (ref 8–23)
CO2: 20 mmol/L — ABNORMAL LOW (ref 22–32)
Calcium: 8.7 mg/dL — ABNORMAL LOW (ref 8.9–10.3)
Chloride: 96 mmol/L — ABNORMAL LOW (ref 98–111)
Creatinine, Ser: 7.79 mg/dL — ABNORMAL HIGH (ref 0.61–1.24)
GFR, Estimated: 7 mL/min — ABNORMAL LOW (ref >60)
Glucose, Bld: 101 mg/dL — ABNORMAL HIGH (ref 70–99)
Potassium: 5.1 mmol/L (ref 3.5–5.1)
Sodium: 132 mmol/L — ABNORMAL LOW (ref 135–145)
Total Bilirubin: 1.5 mg/dL — ABNORMAL HIGH (ref 0.3–1.2)
Total Protein: 6.8 g/dL (ref 6.5–8.1)

## 2021-07-07 LAB — RESP PANEL BY RT-PCR (FLU A&B, COVID) ARPGX2
Influenza A by PCR: NEGATIVE
Influenza B by PCR: NEGATIVE
SARS Coronavirus 2 by RT PCR: NEGATIVE

## 2021-07-07 LAB — POC OCCULT BLOOD, ED: Fecal Occult Bld: POSITIVE — AB

## 2021-07-07 LAB — PROTIME-INR
INR: 1.6 — ABNORMAL HIGH (ref 0.8–1.2)
Prothrombin Time: 19 seconds — ABNORMAL HIGH (ref 11.4–15.2)

## 2021-07-07 LAB — APTT: aPTT: 38 seconds — ABNORMAL HIGH (ref 24–36)

## 2021-07-07 LAB — LACTIC ACID, PLASMA: Lactic Acid, Venous: 1.5 mmol/L (ref 0.5–1.9)

## 2021-07-07 MED ORDER — SODIUM CHLORIDE 0.9 % IV SOLN
100.0000 mg | Freq: Once | INTRAVENOUS | Status: DC
  Administered 2021-07-07: 100 mg via INTRAVENOUS
  Filled 2021-07-07: qty 100

## 2021-07-07 MED ORDER — SODIUM CHLORIDE 0.9 % IV SOLN
100.0000 mg | Freq: Two times a day (BID) | INTRAVENOUS | Status: DC
  Administered 2021-07-08 – 2021-07-09 (×3): 100 mg via INTRAVENOUS
  Filled 2021-07-07 (×7): qty 100

## 2021-07-07 MED ORDER — LACTATED RINGERS IV SOLN: INTRAVENOUS | Status: DC

## 2021-07-07 MED ORDER — LACTATED RINGERS IV SOLN: INTRAVENOUS | Status: AC

## 2021-07-07 MED ORDER — SODIUM CHLORIDE 0.9 % IV SOLN
1.0000 g | INTRAVENOUS | Status: DC
  Administered 2021-07-08 – 2021-07-10 (×3): 1 g via INTRAVENOUS
  Filled 2021-07-07 (×3): qty 10

## 2021-07-07 MED ORDER — SODIUM CHLORIDE 0.9 % IV SOLN
1.0000 g | Freq: Once | INTRAVENOUS | Status: AC
  Administered 2021-07-07: 1 g via INTRAVENOUS
  Filled 2021-07-07: qty 1

## 2021-07-07 MED ORDER — SODIUM CHLORIDE 0.9 % IV SOLN: 2.0000 g | Freq: Once | INTRAVENOUS | Status: DC

## 2021-07-07 NOTE — H&P (Signed)
History and Physical    Darcell Sabino XLK:[id number] DOB: 1960/09/23 DOA: 07/07/2021  PCP: [name], PA  Patient coming from: Home  I have personally briefly reviewed patient's old medical records in [place]  Chief Complaint: Weakness, rectal bleeding and lower extremity edema  HPI: Thomas Robinson is a 60 y.o. male with medical history significant for Chronic hypoxemic respiratory failure on 2.5 L via nasal cannula, chronic diastolic CHF, ESRD on HD MWF, thoracic aorta aneurysm, atrial flutter on Eliquis, chronic anemia, secondary hyperparathyroidism, hypertension and hyperlipidemia who presents with concerns of increasing weakness, rectal bleeding and increasing lower extremity edema.  He was recently admitted from 10/30-11/4 and left AGAINST MEDICAL ADVICE.  He had acute on chronic hypoxemic respiratory failure secondary to pleural effusion and pneumonia.  He underwent thoracentesis for right-sided pleural effusion which was transudative.  Had septic shock needing initial pressor pressure support with Levophed. He was also newly diagnosed with atrial flutter and was started on low-dose metoprolol and Eliquis by cardiology.  Since returning home from his admission, felt like his weakness has been waxing and waning.  He also noticed bright red blood per rectum.  After that, he discontinued Eliquis on his own.  He also does not like the way anticoagulant makes him feel.  States it makes him feel weak and he does not want to resume this medication.  Also notes increasing lower extremity edema that increases with elevation of his feet. He denies missing any of his dialysis session and states he last went on Friday. Denies any worsening shortness of breath or chest pain.  No nausea or vomiting.  Has noted diarrhea for the past 3 days. He continues to make some urine at times.  ED Course: Reportedly, patient was found to confused and hypoxic down to 85% on his baseline oxygen  by EMS.  In the ED, he is requiring 6 L and is tachycardic with heart rate of 120s in atrial flutter with borderline prolonged QT.  Blood pressure otherwise normotensive. WBC pending at time mission.  Hemoglobin up to 10 which is around baseline.  Platelet of 123.  FOBT positive. Sodium of 132, K of 5.1, chloride of 96, CO2 28, creatinine of 7.79 from 4.97 several days ago.  Anion gap was 16.  BG of 101 AST of 102 and ALT of 496 which are both downward trending from prior labs.  Chest x-ray shows small right pleural effusion but resolving right-sided consolidation.  Patient was started on IV doxycycline and cefepime and hospitalist was called for admission for further management.  Review of Systems: Constitutional: No Weight Change, No Fever ENT/Mouth: No sore throat, No Rhinorrhea Eyes: No Eye Pain, No Vision Changes Cardiovascular: No Chest Pain, no SOB Respiratory: No Cough, No Sputum Gastrointestinal: No Nausea, No Vomiting, + Diarrhea, No Constipation, No Pain Genitourinary: no Urinary Incontinence Musculoskeletal: No Arthralgias, No Myalgias Skin: No Skin Lesions, No Pruritus, Neuro: +Weakness, No Numbness Psych: No Anxiety/Panic, No Depression, + decrease appetite Heme/Lymph: No Bruising, No Bleeding  Past Medical History:  Diagnosis Date   Anaphylactic shock, unspecified, sequela 06/10/2019   Aortic stenosis    mild-moderate AS 12/2020 echo   Arthritis    Asthma, chronic, unspecified asthma severity, with acute exacerbation 10/23/2017   CAP (community acquired pneumonia) 10/23/2017   Carpal tunnel syndrome of right wrist 10/05/2018   Cataract    right - removed by surgery   Cerebral infarction due to thrombosis of cerebral artery (HCC)    Cervical  disc herniation 01/04/2020   Chronic diastolic heart failure ([place]) 04/13/2018   Chronic low back pain 11/24/2019   Constipation    COPD (chronic obstructive pulmonary disease) (HCC)    Cough    chronic cough   Dyspnea     Encounter for immunization 07/08/2017   ESRD on hemodialysis ([place]) 02/16/2018   Fall 06/04/2019   GERD (gastroesophageal reflux disease) 06/04/2019   GI bleeding 05/24/2017   Gram-negative sepsis, unspecified ([place]) 09/05/2017   History of fusion of cervical spine 03/26/2020   Hyperlipidemia    Hypertension    Hypokalemia 06/04/2017   Iron deficiency anemia, unspecified 09/09/2017   Left shoulder pain 11/11/2019   Lumbar radiculopathy 11/24/2019   Lung contusion 06/04/2019   LVH (left ventricular hypertrophy) due to hypertensive disease, with heart failure ([place]) 06/18/2020   Macrocytic anemia 05/24/2017   Moderate protein-calorie malnutrition ([place]) 06/06/2017   Myofascial pain syndrome 06/12/2020   Neuritis of right ulnar nerve 09/13/2018   Non-compliance with renal dialysis ([place]) 02/08/2020   Oxygen deficiency 12/30/2019   O2 sats on RA 87% at PAT appt    Pain in joint of right elbow 09/13/2018   Pulmonary hypertension ([place])    Renovascular hypertension 06/22/2020   Restless leg syndrome    Rib fractures 06/2019   Right   S/P cardiac cath 08/27/2020   normal coronary arteries.   Secondary hyperparathyroidism of renal origin ([place]) 06/04/2017   Thoracic ascending aortic aneurysm    4.5 cm 06/04/19 CT   Ulnar neuropathy 10/05/2018   Volume overload 10/23/2017    Past Surgical History:  Procedure Laterality Date   A/V FISTULAGRAM N/A 01/01/2021   Procedure: A/V FISTULAGRAM;  Surgeon: Serafina Mitchell, MD;  Location: [facility];  Service: Cardiovascular;  Laterality: N/A;   A/V FISTULAGRAM N/A 01/15/2021   Procedure: A/V [id number];  Surgeon: Serafina Mitchell, MD;  Location: [facility];  Service: Cardiovascular;  Laterality: N/A;   ANTERIOR CERVICAL DECOMP/DISCECTOMY FUSION N/A 01/04/2020   Procedure: ANTERIOR CERVICAL DECOMPRESSION/DISCECTOMY FUSION CERVICAL FIVE THROUGH SEVEN;  Surgeon: Melina Schools, MD;  Location: [facility];  Service: Orthopedics;  Laterality: N/A;   3 hrs   AV FISTULA PLACEMENT Left 05/28/2017   Procedure: LEFT ARM ARTERIOVENOUS (AV) FISTULA CREATION;  Surgeon: Conrad [place], MD;  Location: [facility];  Service: Vascular;  Laterality: Left;   AV FISTULA PLACEMENT Left 02/02/2021   Procedure: Debride left forearm, ligation of left upper arm Aretiovenous fistula;  Surgeon: Elam Dutch, MD;  [facility];  Service: Vascular;  Laterality: Left;   AV FISTULA PLACEMENT Right 04/04/2021   Procedure: RIGHT ARTERIOVENOUS (AV) FISTULA CREATION;  Surgeon: Serafina Mitchell, MD;  Location: [facility] OR;  Service: Vascular;  Laterality: Right;   EYE SURGERY Right 06/02/2019   Cataract removed   FRACTURE SURGERY     INSERTION OF DIALYSIS CATHETER Right 02/02/2021   Procedure: INSERTION OF Right Internal jugular TUNNELED  DIALYSIS CATHETER;  Surgeon: Elam Dutch, MD;  Location: [facility];  Service: Vascular;  Laterality: Right;   IR DIALY SHUNT INTRO NEEDLE/INTRACATH INITIAL W/IMG LEFT Left 09/12/2020   IR FLUORO GUIDE CV LINE RIGHT  05/25/2017   IR US GUIDE VASC ACCESS LEFT  09/12/2020   IR US GUIDE VASC ACCESS RIGHT  05/25/2017   LIGATION OF ARTERIOVENOUS  FISTULA Left 01/10/2021   Procedure: LIGATION OF LEFT ARM RADIOCEPHALIC FISTULA;  Surgeon: Serafina Mitchell, MD;  Location: [facility];  Service: Vascular;  Laterality: Left;  PERIPHERAL VASCULAR BALLOON ANGIOPLASTY Left 01/15/2021   Procedure: PERIPHERAL VASCULAR BALLOON ANGIOPLASTY;  Surgeon: Serafina Mitchell, MD;  Location: [facility];  Service: Cardiovascular;  Laterality: Left;  AVF   REVISON OF ARTERIOVENOUS FISTULA Left 07/18/2019   Procedure: REVISION PLICATION OF RADIOCEPHALIC ARTERIOVENOUS FISTULA LEFT ARM;  Surgeon: Angelia Mould, MD;  Location: [facility];  Service: Vascular;  Laterality: Left;   REVISON OF ARTERIOVENOUS FISTULA Left 01/10/2021   Procedure: CONVERSION TO BRACHIOCEPHALIC ARTERIOVENOUS FISTULA;  Surgeon: Serafina Mitchell, MD;  Location: [facility];  Service: Vascular;  Laterality:  Left;   RINOPLASTY       reports that he has never smoked. He has never used smokeless tobacco. He reports that he does not currently use alcohol. He reports that he does not use drugs. Social History  Allergies  Allergen Reactions   Vancomycin Shortness Of Breath and Itching    Family History  Problem Relation Age of Onset   Cancer Mother      Prior to Admission medications   Medication Sig Start Date End Date Taking? Authorizing Provider  acetaminophen (TYLENOL) 500 MG tablet Take 1,000 mg by mouth every 6 (six) hours as needed for mild pain, moderate pain or headache.   Yes [provider]  albuterol (VENTOLIN HFA) 108 (90 Base) MCG/ACT inhaler Inhale 2 puffs into the lungs 2 (two) times daily as needed for shortness of breath or wheezing. 06/01/21  Yes Oswald Hillock, MD  amLODipine (NORVASC) 10 MG tablet Take 1 tablet (10 mg total) by mouth daily. Patient taking differently: Take 10 mg by mouth at bedtime. 06/01/21  Yes Oswald Hillock, MD  amoxicillin-clavulanate (AUGMENTIN) 500-125 MG tablet Take 1 tablet (500 mg total) by mouth every evening for 3 days. 07/05/21 07/08/21 Yes Antonieta Pert, MD  apixaban (ELIQUIS) 2.5 MG TABS tablet Take 1 tablet (2.5 mg total) by mouth 2 (two) times daily. 07/05/21 08/04/21 Yes Kc, Maren Beach, MD  BREO ELLIPTA 100-25 MCG/INH AEPB Inhale 1 puff into the lungs daily. Patient taking differently: Inhale 1 puff into the lungs daily as needed (shortness of breath). 06/01/21 07/31/21 Yes Oswald Hillock, MD  cinacalcet (SENSIPAR) 60 MG tablet Take 1 tablet (60 mg total) by mouth every evening. 06/01/21  Yes Oswald Hillock, MD  ferric citrate (AURYXIA) 1 GM 210 MG(Fe) tablet Take 210 mg by mouth See admin instructions. Take one tablet (210 mg) by mouth occasionally   Yes [provider]  furosemide (LASIX) 80 MG tablet Take 1 tablet (80 mg total) by mouth daily as needed for fluid (Use only on non-HD days (Tues/Thur/Sat/Sun)). Patient taking differently:  Take 80 mg by mouth every morning. 06/01/21  Yes Oswald Hillock, MD  metoprolol tartrate (LOPRESSOR) 50 MG tablet Take 1 tablet (50 mg total) by mouth 2 (two) times daily. 06/01/21  Yes Oswald Hillock, MD  midodrine (PROAMATINE) 10 MG tablet Take 1 tablet (10 mg total) by mouth 3 (three) times daily with meals. Patient taking differently: Take 10 mg by mouth 2 (two) times daily. 07/05/21 08/04/21 Yes Antonieta Pert, MD  multivitamin (RENA-VIT) TABS tablet Take 1 tablet by mouth at bedtime. 01/12/18  Yes [provider]  sevelamer carbonate (RENVELA) 800 MG tablet Take 800-1,600 mg by mouth See admin instructions. Take 1-2 tablets ([id number] mg) by mouth three times daily with meals, take 1 tablet (800 mg) with snacks 05/31/21  Yes [provider]  hydrALAZINE (APRESOLINE) 25 MG tablet Take 2 tablets (50 mg  total) by mouth 3 (three) times daily. Patient not taking: No sig reported 06/01/21   Oswald Hillock, MD    Physical Exam: Vitals:   07/07/21 2100 07/07/21 2115 07/07/21 2130 07/07/21 2200  BP: 125/84 129/86 (!) 134/91 126/82  Pulse: (!) 123 (!) 123 (!) 123 (!) 122  Resp: 16 17 (!) 21 19  Temp:      TempSrc:      SpO2: 99% 99% 96% 94%  Weight:      Height:        Constitutional: NAD, calm, comfortable, chronically ill-appearing thin cachectic male laying at approximately 40 degree incline in bed Vitals:   07/07/21 2100 07/07/21 2115 07/07/21 2130 07/07/21 2200  BP: 125/84 129/86 (!) 134/91 126/82  Pulse: (!) 123 (!) 123 (!) 123 (!) 122  Resp: 16 17 (!) 21 19  Temp:      TempSrc:      SpO2: 99% 99% 96% 94%  Weight:      Height:       Eyes: PERRL, lids and conjunctivae normal ENMT: Mucous membranes are moist.  Neck: normal, supple Respiratory: clear to auscultation bilaterally, no wheezing, no crackles. Normal respiratory effort on 6 L via nasal cannula Cardiovascular: Regular rate and rhythm, no murmurs / rubs / gallops.  +3 pitting edema of the distal lower extremity and  feet.   Abdomen: no tenderness, no masses palpated.  Bowel sounds positive.  Musculoskeletal: no clubbing / cyanosis. No joint deformity upper and lower extremities. Good ROM, no contractures. Normal muscle tone.  Skin: no rashes, lesions, ulcers. No induration Neurologic: CN 2-12 grossly intact. Sensation intact, Strength 5/5 in all 4.  Psychiatric: Normal judgment and insight. Alert and oriented x 3. Normal mood.     Labs on Admission: I have personally reviewed following labs and imaging studies  CBC: Recent Labs  Lab 07/01/21 1329 07/01/21 1607 07/02/21 0420 07/03/21 0412 07/04/21 0249 07/07/21 2025  WBC 7.4  --  7.2 7.4 9.6 PENDING  NEUTROABS  --   --   --   --   --  PENDING  HGB 10.4* 9.9* 9.1* 9.1* 9.9* 10.0*  HCT 33.1* 29.0* 28.1* 28.9* 29.6* 31.0*  MCV 104.4*  --  101.8* 101.8* 98.3 104.7*  PLT 130*  --  122* 99* 125* 030*   Basic Metabolic Panel: Recent Labs  Lab 07/02/21 0420 07/02/21 1600 07/03/21 0409 07/03/21 1331 07/04/21 0249 07/05/21 0614 07/07/21 2025  NA 129* 131* 129* 131* 133* 132* 132*  K 4.7 4.8 5.3* 5.4* 5.5* 3.9 5.1  CL 93* 97* 96* 98 99 96* 96*  CO2 17* 21* 18* 19* 19* 22 20*  GLUCOSE 119* 158* 120* 147* 123* 104* 101*  BUN 62* 39* 45* 51* 58* 32* 58*  CREATININE 7.90* 5.52* 6.46* 6.78* 7.22* 4.97* 7.79*  CALCIUM 8.0* 8.3* 8.4* 8.3* 8.5* 8.7* 8.7*  MG 2.6*  --   --   --   --   --   --   PHOS 8.1* 5.3*  --   --   --   --   --    GFR: Estimated Creatinine Clearance: 8.5 mL/min (A) (by C-G formula based on SCr of 7.79 mg/dL (H)). Liver Function Tests: Recent Labs  Lab 07/03/21 0409 07/03/21 1331 07/04/21 0249 07/05/21 0614 07/07/21 2025  AST 2,105* 1,389* 738* 309* 102*  ALT 1,405* 1,296* 1,130* 888* 496*  ALKPHOS 78 79 85 118 159*  BILITOT 1.1 1.2 1.2 1.2 1.5*  PROT 5.7* 5.4* 5.5*  5.9* 6.8  ALBUMIN 2.3* 2.2* 2.2* 2.4* 3.1*   Recent Labs  Lab 07/01/21 1329  LIPASE 44  AMYLASE 93   No results for input(s): AMMONIA in the  last 168 hours. Coagulation Profile: Recent Labs  Lab 07/01/21 1329 07/03/21 0412 07/04/21 1841 07/07/21 2159  INR 1.6* 2.1* 1.5* 1.6*   Cardiac Enzymes: Recent Labs  Lab 07/03/21 1031  CKTOTAL 30*   BNP (last 3 results) No results for input(s): PROBNP in the last 8760 hours. HbA1C: No results for input(s): HGBA1C in the last 72 hours. CBG: Recent Labs  Lab 07/04/21 2040 07/04/21 2336 07/05/21 0423 07/05/21 0814 07/05/21 1209  GLUCAP 137* 103* 124* 84 97   Lipid Profile: No results for input(s): CHOL, HDL, LDLCALC, TRIG, CHOLHDL, LDLDIRECT in the last 72 hours. Thyroid Function Tests: No results for input(s): TSH, T4TOTAL, FREET4, T3FREE, THYROIDAB in the last 72 hours. Anemia Panel: No results for input(s): VITAMINB12, FOLATE, FERRITIN, TIBC, IRON, RETICCTPCT in the last 72 hours. Urine analysis:    Component Value Date/Time   COLORURINE YELLOW 05/24/2017 1729   APPEARANCEUR HAZY (A) 05/24/2017 1729   LABSPEC 1.011 05/24/2017 1729   PHURINE 5.0 05/24/2017 1729   GLUCOSEU 50 (A) 05/24/2017 1729   HGBUR SMALL (A) 05/24/2017 1729   BILIRUBINUR NEGATIVE 05/24/2017 1729   KETONESUR NEGATIVE 05/24/2017 1729   PROTEINUR 100 (A) 05/24/2017 1729   UROBILINOGEN 0.2 04/09/2015 1527   NITRITE NEGATIVE 05/24/2017 1729   LEUKOCYTESUR TRACE (A) 05/24/2017 1729    Radiological Exams on Admission: DG Chest Port 1 View  Result Date: 07/07/2021 CLINICAL DATA:  Altered mental status x2 days, questionable sepsis. EXAM: PORTABLE CHEST 1 VIEW COMPARISON:  Chest radiograph July 02, 2021 FINDINGS: Dual lumen right chest wall dialysis catheter with tip overlying the superior cavoatrial junction. The heart size and mediastinal contours are stable. Similar size of the small right pleural effusion. Interval increased aeration of the right lower lobe of a decrease in the right basilar consolidation. No new focal consolidation. The visualized skeletal structures are unchanged.  IMPRESSION: 1. Similar size of the small right pleural effusion with interval improved aeration of the right lower lobe and decreased in the right basilar consolidation. No new focal consolidation. Electronically Signed   By: Dahlia Bailiff M.D.   On: 07/07/2021 20:45      Assessment/Plan  Acute on chronic hypoxemic respiratory failure Secondary to resolving right-sided pneumonia -Patient recently admitted for right-sided pneumonia with septic shock.  However he left AMA and unclear whether he has been compliant with antibiotics -Continue IV cefepime and doxycycline  Rectal bleeding Anemia of chronic disease -Noted bright red blood per rectum and was recently initiated on Eliquis for atrial flutter during his admission several days ago.   -Hemoglobin is stable at his baseline around 10.  FOBT positive.  Will need to consult GI -Hold Eliquis  Metabolic acidosis with anion gap Due to worsening renal function Chloride of 96, CO2 20, gap 16, creatinine of 7.79 Continuous low-dose 75 cc/h fluid for 10 hours  ESRD on HD MWF Patient reports last dialysis Friday Transfer to [facility] for dialysis Monday.  Patient initially very adamant about going to [facility] has already expressed that he will refuse dialysis Monday however after extensive discussion he will accept transfer  -Needed nephrology consult  Atrial flutter Borderline QT prolongation -Continues to be in atrial flutter with rates in the 120s -This was newly diagnosed during his last admission with cardiology consulted on 11/3 -There was  discussion of possible TEE/DCCV once he recovers from acute illness but patient has discontinued Eliquis on his own due to rectal bleeding -will monitor to see if HR improves with fluid since he has borderline prolonged QT which makes it difficult to increase his metoprolol    Liver dysfunction  AST 102, ALT 496, INR elevated at 1.6 but has been downward trending significantly from prior labs a  few days ago. Previous LFT > 3794  Chronic diastolic CHF Fluid management per HD  Coccyx stage II ulcer Wound care per RN  DVT prophylaxis: SCD Code Status: Full Family Communication: Plan discussed with patient at bedside  disposition Plan: Home with at least 2 midnight stays  Consults called:  Admission status: inpatient  Level of care: Progressive  Status is: Inpatient  Remains inpatient appropriate because: Acute on chronic hypoxemic respiratory failure requiring increasing oxygen supplementation along with rectal bleeding requiring GI consult         Orene Desanctis DO [facility]   If 7PM-7AM, please contact night-coverage [email or web address]   07/07/2021, 11:25 PM

## 2021-07-07 NOTE — ED Provider Notes (Signed)
[facility] DEPT Provider Note   CSN: [id number] Arrival date & time: 07/07/21  1925     History Chief Complaint  Patient presents with   Weakness    Thomas Robinson is a 60 y.o. male.  HPI     60 y.o. male with PMH of chronic hypoxic respiratory failure on 2.5l [place] , chronic diastolic congestive heart failure, COPD and ESRD on HD, thoracic ascending aortic aneurysm, hypertension who presented to the ER via EMS with chief complaint of confusion and weakness.  Patient was admitted with acute hypoxic respiratory failure secondary to lobar pneumonia on 10-29.  He signed out AMA on 11-4.  Patient states that after he left AMA, his symptoms have been waxing and waning.  He lives with a roommate.  Today he was not feeling great and called EMS.  Pt has not had any hemodialysis after he was discharged.  He reports that he is not able to get around like he normally does.  He also reports that today he started having bloody stools.  Stools are described as dark and tarry.  He has had bloody stools in the past, but not to this extent.  Patient is normally on 2 L of oxygen.  Per EMS, when they arrived and had him on 4 L of oxygen patient's O2 sats was still in the 80s.  He is currently on 10 L of oxygen.  Past Medical History:  Diagnosis Date   Anaphylactic shock, unspecified, sequela 06/10/2019   Aortic stenosis    mild-moderate AS 12/2020 echo   Arthritis    Asthma, chronic, unspecified asthma severity, with acute exacerbation 10/23/2017   CAP (community acquired pneumonia) 10/23/2017   Carpal tunnel syndrome of right wrist 10/05/2018   Cataract    right - removed by surgery   Cerebral infarction due to thrombosis of cerebral artery (HCC)    Cervical disc herniation 01/04/2020   Chronic diastolic heart failure ([place]) 04/13/2018   Chronic low back pain 11/24/2019   Constipation    COPD (chronic obstructive pulmonary disease) (HCC)    Cough    chronic  cough   Dyspnea    Encounter for immunization 07/08/2017   ESRD on hemodialysis ([place]) 02/16/2018   Fall 06/04/2019   GERD (gastroesophageal reflux disease) 06/04/2019   GI bleeding 05/24/2017   Gram-negative sepsis, unspecified ([place]) 09/05/2017   History of fusion of cervical spine 03/26/2020   Hyperlipidemia    Hypertension    Hypokalemia 06/04/2017   Iron deficiency anemia, unspecified 09/09/2017   Left shoulder pain 11/11/2019   Lumbar radiculopathy 11/24/2019   Lung contusion 06/04/2019   LVH (left ventricular hypertrophy) due to hypertensive disease, with heart failure ([place]) 06/18/2020   Macrocytic anemia 05/24/2017   Moderate protein-calorie malnutrition ([place]) 06/06/2017   Myofascial pain syndrome 06/12/2020   Neuritis of right ulnar nerve 09/13/2018   Non-compliance with renal dialysis ([place]) 02/08/2020   Oxygen deficiency 12/30/2019   O2 sats on RA 87% at PAT appt    Pain in joint of right elbow 09/13/2018   Pulmonary hypertension ([place])    Renovascular hypertension 06/22/2020   Restless leg syndrome    Rib fractures 06/2019   Right   S/P cardiac cath 08/27/2020   normal coronary arteries.   Secondary hyperparathyroidism of renal origin ([place]) 06/04/2017   Thoracic ascending aortic aneurysm    4.5 cm 06/04/19 CT   Ulnar neuropathy 10/05/2018   Volume overload 10/23/2017    Patient Active Problem List  Diagnosis Date Noted   Atrial flutter ([place])    Pressure injury of skin 07/02/2021   Abnormal chest CT 06/30/2021   Pleural effusion, right 06/30/2021   SIRS (systemic inflammatory response syndrome) ([place]) 06/30/2021   Acute hypoxemic respiratory failure ([place]) 05/18/2021   Bronchitis 05/18/2021   Preoperative cardiovascular examination 03/06/2021   CHF (congestive heart failure) ([place]) 02/17/2021   Cellulitis of arm, left 02/01/2021   Cellulitis of left lower limb 02/01/2021   Cellulitis, unspecified 02/01/2021   Left arm swelling 01/25/2021   Acute and chronic  respiratory failure with hypoxia ([place]) 01/13/2021   Acute on chronic respiratory failure with hypoxia ([place]) 01/12/2021   Concussion with no loss of consciousness 01/12/2021   Laceration without foreign body of left forearm, initial encounter 01/12/2021   Age-related physical debility 09/20/2020   Allergy, unspecified, initial encounter 09/20/2020   Nausea 09/20/2020   Pain, unspecified 09/20/2020   Pruritus, unspecified 09/20/2020   S/P cardiac cath 08/27/2020   Acute encephalopathy 08/26/2020   Peripheral neuropathy 08/26/2020   Elevated troponin 08/25/2020   Hyperkalemia 08/05/2020   Arthritis    Asthma    Cataract    Cerebral infarction due to thrombosis of cerebral artery (HCC)    History of CVA (cerebrovascular accident)    Chronic kidney disease    Dyspnea    Hyperlipidemia    Hypertension    Kidney failure    Restless leg syndrome    Degenerative lumbar spinal stenosis 82/42/3536   Diastolic dysfunction 14/43/1540   Renovascular hypertension 06/22/2020   Demand ischemia ([place]) 06/18/2020   LVH (left ventricular hypertrophy) due to hypertensive disease, with heart failure ([place]) 06/18/2020   Myofascial pain syndrome 06/12/2020   Encounter for removal of sutures 03/29/2020   History of fusion of cervical spine 03/26/2020   Non-compliance with renal dialysis ([place]) 02/08/2020   Pulmonary edema 01/23/2020   Cervical disc herniation 01/04/2020   Tachycardia 01/04/2020   SOB (shortness of breath)    Hypoxia 12/30/2019   Chronic low back pain 11/24/2019   Degeneration of lumbar intervertebral disc 11/24/2019   Lumbar radiculopathy 11/24/2019   Left shoulder pain 11/11/2019   Anaphylactic shock, unspecified, sequela 06/10/2019   Rib fractures 06/05/2019   Fall at home, initial encounter 06/04/2019   Right rib fracture 06/04/2019   Lung contusion 06/04/2019   Acute respiratory failure with hypoxia ([place]) 06/04/2019   Anemia in ESRD (end-stage renal disease) ([place]) 06/04/2019    GERD without esophagitis 06/04/2019   Chronic diastolic (congestive) heart failure ([place]) 06/04/2019   Fall 06/04/2019   Thoracic ascending aortic aneurysm 06/04/2019   Acute exacerbation of congestive heart failure ([place]) 05/16/2019   Carpal tunnel syndrome of right wrist 10/05/2018   Ulnar neuropathy 10/05/2018   Neuritis of right ulnar nerve 09/13/2018   Pain in joint of right elbow 09/13/2018   Chest pain 04/13/2018   Chronic diastolic heart failure ([place]) 04/13/2018   Acute on chronic diastolic CHF (congestive heart failure) ([place]) 04/13/2018   Atypical chest pain 02/16/2018   ESRD on dialysis ([place]) 02/16/2018   End stage renal disease ([place]) 02/16/2018   CAP (community acquired pneumonia) 10/23/2017   Asthma, chronic, unspecified asthma severity, with acute exacerbation 10/23/2017   Respiratory failure, acute ([place]) 10/23/2017   Pulmonary hypertension ([place]) 10/23/2017   Iron deficiency anemia, unspecified 09/09/2017   Gram-negative sepsis, unspecified ([place]) 09/05/2017   Encounter for immunization 07/08/2017   Moderate protein-calorie malnutrition ([place]) 06/06/2017   Coagulation defect, unspecified ([place]) 06/04/2017   Hypokalemia 06/04/2017  Other hyperlipidemia 06/04/2017   Secondary hyperparathyroidism of renal origin ([place]) 06/04/2017   Macrocytic anemia 05/24/2017   Uremia 05/24/2017   Hypertensive urgency 05/24/2017   Acute kidney injury superimposed on CKD ([place]) 05/24/2017   CKD (chronic kidney disease), stage V ([place]) 05/24/2017   GI bleeding 05/24/2017   Anemia of chronic disease 05/24/2017   Stroke ([place]) 2018   AKI (acute kidney injury) ([place]) 04/10/2015   History of completed stroke    Essential hypertension 04/09/2015    Past Surgical History:  Procedure Laterality Date   A/V FISTULAGRAM N/A 01/01/2021   Procedure: A/V FISTULAGRAM;  Surgeon: Serafina Mitchell, MD;  Location: [facility];  Service: Cardiovascular;  Laterality: N/A;   A/V FISTULAGRAM N/A  01/15/2021   Procedure: A/V [id number];  Surgeon: Serafina Mitchell, MD;  Location: [facility];  Service: Cardiovascular;  Laterality: N/A;   ANTERIOR CERVICAL DECOMP/DISCECTOMY FUSION N/A 01/04/2020   Procedure: ANTERIOR CERVICAL DECOMPRESSION/DISCECTOMY FUSION CERVICAL FIVE THROUGH SEVEN;  Surgeon: Melina Schools, MD;  Location: [facility];  Service: Orthopedics;  Laterality: N/A;  3 hrs   AV FISTULA PLACEMENT Left 05/28/2017   Procedure: LEFT ARM ARTERIOVENOUS (AV) FISTULA CREATION;  Surgeon: Conrad Hardesty, MD;  Location: [facility];  Service: Vascular;  Laterality: Left;   AV FISTULA PLACEMENT Left 02/02/2021   Procedure: Debride left forearm, ligation of left upper arm Aretiovenous fistula;  Surgeon: Elam Dutch, MD;  Location: [facility] OR;  Service: Vascular;  Laterality: Left;   AV FISTULA PLACEMENT Right 04/04/2021   Procedure: RIGHT ARTERIOVENOUS (AV) FISTULA CREATION;  Surgeon: Serafina Mitchell, MD;  Location: [facility];  Service: Vascular;  Laterality: Right;   EYE SURGERY Right 06/02/2019   Cataract removed   FRACTURE SURGERY     INSERTION OF DIALYSIS CATHETER Right 02/02/2021   Procedure: INSERTION OF Right Internal jugular TUNNELED  DIALYSIS CATHETER;  Surgeon: Elam Dutch, MD;  Location: [facility];  Service: Vascular;  Laterality: Right;   IR DIALY SHUNT INTRO NEEDLE/INTRACATH INITIAL W/IMG LEFT Left 09/12/2020   IR FLUORO GUIDE CV LINE RIGHT  05/25/2017   IR US GUIDE VASC ACCESS LEFT  09/12/2020   IR US GUIDE VASC ACCESS RIGHT  05/25/2017   LIGATION OF ARTERIOVENOUS  FISTULA Left 01/10/2021   Procedure: LIGATION OF LEFT ARM RADIOCEPHALIC FISTULA;  Surgeon: Serafina Mitchell, MD;  Location: [facility];  Service: Vascular;  Laterality: Left;   PERIPHERAL VASCULAR BALLOON ANGIOPLASTY Left 01/15/2021   Procedure: PERIPHERAL VASCULAR BALLOON ANGIOPLASTY;  Surgeon: Serafina Mitchell, MD;  Location: [facility];  Service: Cardiovascular;  Laterality: Left;  AVF   REVISON OF ARTERIOVENOUS FISTULA Left  07/18/2019   Procedure: REVISION PLICATION OF RADIOCEPHALIC ARTERIOVENOUS FISTULA LEFT ARM;  Surgeon: Angelia Mould, MD;  Location: [facility] OR;  Service: Vascular;  Laterality: Left;   REVISON OF ARTERIOVENOUS FISTULA Left 01/10/2021   Procedure: CONVERSION TO BRACHIOCEPHALIC ARTERIOVENOUS FISTULA;  Surgeon: Serafina Mitchell, MD;  [facility];  Service: Vascular;  Laterality: Left;   RINOPLASTY         Family History  Problem Relation Age of Onset   Cancer Mother     Social History   Tobacco Use   Smoking status: Never   Smokeless tobacco: Never  Vaping Use   Vaping Use: Never used  Substance Use Topics   Alcohol use: Not Currently    Alcohol/week: 0.0 standard drinks    Comment: has a drink once in a while- 12/30/19  Drug use: No    Home Medications Prior to Admission medications   Medication Sig Start Date End Date Taking? Authorizing Provider  acetaminophen (TYLENOL) 500 MG tablet Take 1,000 mg by mouth every 6 (six) hours as needed for mild pain, moderate pain or headache.   Yes [provider]  albuterol (VENTOLIN HFA) 108 (90 Base) MCG/ACT inhaler Inhale 2 puffs into the lungs 2 (two) times daily as needed for shortness of breath or wheezing. 06/01/21  Yes Oswald Hillock, MD  amLODipine (NORVASC) 10 MG tablet Take 1 tablet (10 mg total) by mouth daily. Patient taking differently: Take 10 mg by mouth at bedtime. 06/01/21  Yes Oswald Hillock, MD  amoxicillin-clavulanate (AUGMENTIN) 500-125 MG tablet Take 1 tablet (500 mg total) by mouth every evening for 3 days. 07/05/21 07/08/21 Yes Antonieta Pert, MD  apixaban (ELIQUIS) 2.5 MG TABS tablet Take 1 tablet (2.5 mg total) by mouth 2 (two) times daily. 07/05/21 08/04/21 Yes Kc, Maren Beach, MD  BREO ELLIPTA 100-25 MCG/INH AEPB Inhale 1 puff into the lungs daily. Patient taking differently: Inhale 1 puff into the lungs daily as needed (shortness of breath). 06/01/21 07/31/21 Yes Oswald Hillock, MD  cinacalcet (SENSIPAR) 60 MG  tablet Take 1 tablet (60 mg total) by mouth every evening. 06/01/21  Yes Oswald Hillock, MD  ferric citrate (AURYXIA) 1 GM 210 MG(Fe) tablet Take 210 mg by mouth See admin instructions. Take one tablet (210 mg) by mouth occasionally   Yes [provider]  furosemide (LASIX) 80 MG tablet Take 1 tablet (80 mg total) by mouth daily as needed for fluid (Use only on non-HD days (Tues/Thur/Sat/Sun)). Patient taking differently: Take 80 mg by mouth every morning. 06/01/21  Yes Oswald Hillock, MD  metoprolol tartrate (LOPRESSOR) 50 MG tablet Take 1 tablet (50 mg total) by mouth 2 (two) times daily. 06/01/21  Yes Oswald Hillock, MD  midodrine (PROAMATINE) 10 MG tablet Take 1 tablet (10 mg total) by mouth 3 (three) times daily with meals. Patient taking differently: Take 10 mg by mouth 2 (two) times daily. 07/05/21 08/04/21 Yes Antonieta Pert, MD  multivitamin (RENA-VIT) TABS tablet Take 1 tablet by mouth at bedtime. 01/12/18  Yes [provider]  sevelamer carbonate (RENVELA) 800 MG tablet Take 800-1,600 mg by mouth See admin instructions. Take 1-2 tablets ([phone] mg) by mouth three times daily with meals, take 1 tablet (800 mg) with snacks 05/31/21  Yes [provider]  hydrALAZINE (APRESOLINE) 25 MG tablet Take 2 tablets (50 mg total) by mouth 3 (three) times daily. Patient not taking: No sig reported 06/01/21   Oswald Hillock, MD    Allergies    Vancomycin  Review of Systems   Review of Systems  Constitutional:  Positive for activity change and fatigue.  Respiratory:  Positive for shortness of breath.   Gastrointestinal:  Positive for blood in stool.  Allergic/Immunologic: Positive for immunocompromised state.  Hematological:  Does not bruise/bleed easily.  All other systems reviewed and are negative.  Physical Exam Updated Vital Signs BP 126/82   Pulse (!) 122   Temp 97.7 F (36.5 C) (Oral)   Resp 19   Ht 5\' 8"  (1.727 m)   Wt 61.2 kg   SpO2 94%   BMI 20.53 kg/m    Physical Exam Vitals and nursing note reviewed.  Constitutional:      Appearance: He is well-developed.  HENT:     Head: Atraumatic.  Cardiovascular:     Rate  and Rhythm: Tachycardia present.  Pulmonary:     Comments: Tachypnea Abdominal:     Comments: Digital rectal exam did not reveal any melena.  Patient does have developing pressure ulcer  Musculoskeletal:     Cervical back: Neck supple.  Skin:    General: Skin is warm.     Findings: Bruising present.  Neurological:     Mental Status: He is alert and oriented to person, place, and time.     ED Results / Procedures / Treatments   Labs (all labs ordered are listed, but only abnormal results are displayed) Labs Reviewed  COMPREHENSIVE METABOLIC PANEL - Abnormal; Notable for the following components:      Result Value   Sodium 132 (*)    Chloride 96 (*)    CO2 20 (*)    Glucose, Bld 101 (*)    BUN 58 (*)    Creatinine, Ser 7.79 (*)    Calcium 8.7 (*)    Albumin 3.1 (*)    AST 102 (*)    ALT 496 (*)    Alkaline Phosphatase 159 (*)    Total Bilirubin 1.5 (*)    GFR, Estimated 7 (*)    Anion gap 16 (*)    All other components within normal limits  CBC WITH DIFFERENTIAL/PLATELET - Abnormal; Notable for the following components:   RBC 2.96 (*)    Hemoglobin 10.0 (*)    HCT 31.0 (*)    MCV 104.7 (*)    RDW 18.8 (*)    Platelets 123 (*)    All other components within normal limits  BLOOD GAS, VENOUS - Abnormal; Notable for the following components:   pH, Ven 7.457 (*)    pCO2, Ven 26.0 (*)    pO2, Ven 202.0 (*)    Bicarbonate 18.1 (*)    Acid-base deficit 4.4 (*)    All other components within normal limits  POC OCCULT BLOOD, ED - Abnormal; Notable for the following components:   Fecal Occult Bld POSITIVE (*)    All other components within normal limits  RESP PANEL BY RT-PCR (FLU A&B, COVID) ARPGX2  CULTURE, BLOOD (ROUTINE X 2)  CULTURE, BLOOD (ROUTINE X 2)  LACTIC ACID, PLASMA  LACTIC ACID, PLASMA   URINALYSIS, ROUTINE W REFLEX MICROSCOPIC  PROTIME-INR  APTT  TYPE AND SCREEN    EKG EKG Interpretation  Date/Time:  Sunday July 07 2021 20:46:00 EST Ventricular Rate:  123 PR Interval:    QRS Duration: 160 QT Interval:  333 QTC Calculation: 477 R Axis:   83 Text Interpretation: Atrial flutter with 2:1 AV block IVCD, consider atypical RBBB LVH with secondary repolarization abnormality Borderline prolonged QT interval No acute changes Confirmed by Varney Biles ([id number]) on 07/07/2021 9:20:21 PM  Radiology DG Chest Port 1 View  Result Date: 07/07/2021 CLINICAL DATA:  Altered mental status x2 days, questionable sepsis. EXAM: PORTABLE CHEST 1 VIEW COMPARISON:  Chest radiograph July 02, 2021 FINDINGS: Dual lumen right chest wall dialysis catheter with tip overlying the superior cavoatrial junction. The heart size and mediastinal contours are stable. Similar size of the small right pleural effusion. Interval increased aeration of the right lower lobe of a decrease in the right basilar consolidation. No new focal consolidation. The visualized skeletal structures are unchanged. IMPRESSION: 1. Similar size of the small right pleural effusion with interval improved aeration of the right lower lobe and decreased in the right basilar consolidation. No new focal consolidation. Electronically Signed   By: Andree Moro.D.  On: 07/07/2021 20:45    Procedures .Critical Care Performed by: Varney Biles, MD Authorized by: Varney Biles, MD   Critical care provider statement:    Critical care time (minutes):  42   Critical care was necessary to treat or prevent imminent or life-threatening deterioration of the following conditions:  Respiratory failure   Critical care was time spent personally by me on the following activities:  Development of treatment plan with patient or surrogate, discussions with consultants, evaluation of patient's response to treatment, examination of patient,  ordering and review of laboratory studies, ordering and review of radiographic studies, pulse oximetry, re-evaluation of patient's condition, review of old charts and obtaining history from patient or surrogate   Medications Ordered in ED Medications  lactated ringers infusion (has no administration in time range)  doxycycline (VIBRAMYCIN) 100 mg in sodium chloride 0.9 % 250 mL IVPB (100 mg Intravenous New Bag/Given 07/07/21 2127)  ceFEPIme (MAXIPIME) 1 g in sodium chloride 0.9 % 100 mL IVPB (0 g Intravenous Stopped 07/07/21 2110)    ED Course  I have reviewed the triage vital signs and the nursing notes.  Pertinent labs & imaging results that were available during my care of the patient were reviewed by me and considered in my medical decision making (see chart for details).  Clinical Course as of 07/07/21 2236  Nancy Fetter Jul 07, 2021  2235 Patient's x-rays and labs reviewed.  He is noted to have several abnormalities, some of them are baseline normal abnormalities for him.  Specifically, there is no hypercapnic respiratory failure, patient does not have profound anemia, and he does not have profound uremia.  Potassium is 5.1.  X-ray was reviewed independently.  There is persistent pleural effusion, no significant changes since last chest x-ray.  At this time, we will proceed with admission.  Patient has been weaned down to 5 L of oxygen. [AN]    Clinical Course User Index [AN] Varney Biles, MD   MDM Rules/Calculators/A&P                           Pt comes in with cc of weakness, confusion.  Patient has multiple medical comorbidities and signed out AMA just 2 days back.  Since going home, he has not picked up any antibiotics nor has he gone for dialysis.  He was profoundly weak per EMS and reports some bloody stools.  Patient also has acute on chronic respiratory failure and requiring increased oxygen.  He is profoundly tachycardic.  Concerns for severe dehydration, electrolyte  abnormalities, profound uremia, worsening pleural effusion, worsening pneumonia.  Plan is to get basic labs, chest x-ray and reassess.   Final Clinical Impression(s) / ED Diagnoses Final diagnoses:  Acute on chronic respiratory failure with hypoxia ([place])  Lobar pneumonia ([place])  Failure to thrive in adult    Rx / DC Orders ED Discharge Orders     None        Varney Biles, MD 07/07/21 2236

## 2021-07-07 NOTE — ED Triage Notes (Signed)
Pt BIBA for AMS x 2 days. Per ems, pt was recently DX with pneumonia.Pt appears lethargic and sleepy, NAD but SPO2 on 4LPM 87%. LS clear. +tachy

## 2021-07-07 NOTE — Progress Notes (Signed)
A consult was received from an ED physician for Cefepime per pharmacy dosing.  The patient's profile has been reviewed for ht/wt/allergies/indication/available labs.    A one time order has been placed for Cefepime 1g IV.  Further antibiotics/pharmacy consults should be ordered by admitting physician if indicated.                       Thank you, Luiz Ochoa 07/07/2021  8:31 PM

## 2021-07-07 NOTE — ED Notes (Signed)
Pt appears lethargic, weak. A/ox4. Pt states he has been weak, having black stools and diarrhea. +CP, substernal for several "while" before that. Denies SOB but states he had pneumonia. Denies dizziness. LS clear bilaterally. Very weak raidal pulses, delayed cap refill. Pt dialysis MWF, cath  in right chest. Pt denies missing sessions.

## 2021-07-07 NOTE — Sepsis Progress Note (Signed)
[facility] following for Sepsis Protocol 

## 2021-07-07 NOTE — ED Notes (Signed)
One blood culture, 2 pink tops, 1 light green, 1 dark green and 1 lavender sent to lab.

## 2021-07-07 NOTE — ED Notes (Signed)
Pt has small pressure sore inside buttocks, photo taken by MD Kathrynn Humble and uploaded to chart

## 2021-07-08 ENCOUNTER — Inpatient Hospital Stay (HOSPITAL_COMMUNITY): Payer: Medicare Other

## 2021-07-08 DIAGNOSIS — L89152 Pressure ulcer of sacral region, stage 2: Secondary | ICD-10-CM

## 2021-07-08 DIAGNOSIS — I483 Typical atrial flutter: Secondary | ICD-10-CM

## 2021-07-08 DIAGNOSIS — K921 Melena: Principal | ICD-10-CM

## 2021-07-08 DIAGNOSIS — R9431 Abnormal electrocardiogram [ECG] [EKG]: Secondary | ICD-10-CM

## 2021-07-08 LAB — BRAIN NATRIURETIC PEPTIDE: B Natriuretic Peptide: >4500 pg/mL — ABNORMAL HIGH (ref 0.0–100.0)

## 2021-07-08 LAB — BASIC METABOLIC PANEL
Anion gap: 18 — ABNORMAL HIGH (ref 5–15)
BUN: 56 mg/dL — ABNORMAL HIGH (ref 8–23)
CO2: 18 mmol/L — ABNORMAL LOW (ref 22–32)
Calcium: 8.7 mg/dL — ABNORMAL LOW (ref 8.9–10.3)
Chloride: 98 mmol/L (ref 98–111)
Creatinine, Ser: 8.29 mg/dL — ABNORMAL HIGH (ref 0.61–1.24)
GFR, Estimated: 7 mL/min — ABNORMAL LOW (ref >60)
Glucose, Bld: 90 mg/dL (ref 70–99)
Potassium: 3.8 mmol/L (ref 3.5–5.1)
Sodium: 134 mmol/L — ABNORMAL LOW (ref 135–145)

## 2021-07-08 LAB — CBC
HCT: 28.8 % — ABNORMAL LOW (ref 39.0–52.0)
Hemoglobin: 9.3 g/dL — ABNORMAL LOW (ref 13.0–17.0)
MCH: 32.5 pg (ref 26.0–34.0)
MCHC: 32.3 g/dL (ref 30.0–36.0)
MCV: 100.7 fL — ABNORMAL HIGH (ref 80.0–100.0)
Platelets: 105 K/uL — ABNORMAL LOW (ref 150–400)
RBC: 2.86 MIL/uL — ABNORMAL LOW (ref 4.22–5.81)
RDW: 19 % — ABNORMAL HIGH (ref 11.5–15.5)
WBC: 8.7 K/uL (ref 4.0–10.5)
nRBC: 6.2 % — ABNORMAL HIGH (ref 0.0–0.2)

## 2021-07-08 LAB — TYPE AND SCREEN
ABO/RH(D): A POS
Antibody Screen: NEGATIVE

## 2021-07-08 LAB — LACTIC ACID, PLASMA: Lactic Acid, Venous: 1.2 mmol/L (ref 0.5–1.9)

## 2021-07-08 LAB — GLUCOSE, CAPILLARY: Glucose-Capillary: 109 mg/dL — ABNORMAL HIGH (ref 70–99)

## 2021-07-08 LAB — HEPATITIS B SURFACE ANTIGEN: Hepatitis B Surface Ag: NONREACTIVE

## 2021-07-08 LAB — PATHOLOGIST SMEAR REVIEW

## 2021-07-08 LAB — HEPATITIS B SURFACE ANTIBODY,QUALITATIVE: Hep B S Ab: NONREACTIVE

## 2021-07-08 MED ORDER — ALBUTEROL SULFATE HFA 108 (90 BASE) MCG/ACT IN AERS
2.0000 | INHALATION_SPRAY | Freq: Two times a day (BID) | RESPIRATORY_TRACT | Status: DC | PRN
  Filled 2021-07-08: qty 6.7

## 2021-07-08 MED ORDER — ACETAMINOPHEN 500 MG PO TABS
1000.0000 mg | ORAL_TABLET | Freq: Four times a day (QID) | ORAL | Status: DC | PRN
  Administered 2021-07-11: 1000 mg via ORAL
  Filled 2021-07-08: qty 2

## 2021-07-08 MED ORDER — HEPARIN SODIUM (PORCINE) 1000 UNIT/ML DIALYSIS
1000.0000 [IU] | INTRAMUSCULAR | Status: DC | PRN
  Filled 2021-07-08 (×2): qty 1

## 2021-07-08 MED ORDER — ALTEPLASE 2 MG IJ SOLR
2.0000 mg | Freq: Once | INTRAMUSCULAR | Status: DC | PRN
  Filled 2021-07-08: qty 2

## 2021-07-08 MED ORDER — TECHNETIUM TO 99M ALBUMIN AGGREGATED
3.9100 | Freq: Once | INTRAVENOUS | Status: AC | PRN
  Administered 2021-07-08: 3.91 via INTRAVENOUS

## 2021-07-08 MED ORDER — FLUTICASONE FUROATE-VILANTEROL 100-25 MCG/ACT IN AEPB
1.0000 | INHALATION_SPRAY | Freq: Every day | RESPIRATORY_TRACT | Status: DC
  Filled 2021-07-08 (×2): qty 28

## 2021-07-08 MED ORDER — HEPARIN SODIUM (PORCINE) 1000 UNIT/ML IJ SOLN
3200.0000 [IU] | Freq: Once | INTRAMUSCULAR | Status: AC
  Administered 2021-07-08: 3200 [IU] via INTRAVENOUS

## 2021-07-08 MED ORDER — AMLODIPINE BESYLATE 10 MG PO TABS
10.0000 mg | ORAL_TABLET | Freq: Every day | ORAL | Status: DC
  Administered 2021-07-08: 10 mg via ORAL
  Filled 2021-07-08: qty 2

## 2021-07-08 MED ORDER — RENA-VITE PO TABS
1.0000 | ORAL_TABLET | Freq: Every day | ORAL | Status: DC
Start: 2021-07-08 — End: 2021-07-11
  Administered 2021-07-08 – 2021-07-10 (×2): 1 via ORAL
  Filled 2021-07-08 (×4): qty 1

## 2021-07-08 MED ORDER — HYDROCORTISONE 1 % EX OINT
TOPICAL_OINTMENT | Freq: Two times a day (BID) | CUTANEOUS | Status: DC
  Filled 2021-07-08: qty 28

## 2021-07-08 MED ORDER — HYDROXYZINE HCL 10 MG PO TABS
10.0000 mg | ORAL_TABLET | Freq: Three times a day (TID) | ORAL | Status: DC | PRN
  Administered 2021-07-08: 10 mg via ORAL
  Filled 2021-07-08: qty 1

## 2021-07-08 MED ORDER — SODIUM CHLORIDE 0.9 % IV SOLN: 100.0000 mL | INTRAVENOUS | Status: DC | PRN

## 2021-07-08 MED ORDER — LIDOCAINE HCL (PF) 1 % IJ SOLN: 5.0000 mL | INTRAMUSCULAR | Status: DC | PRN

## 2021-07-08 MED ORDER — NALOXONE HCL 0.4 MG/ML IJ SOLN: 0.4000 mg | INTRAMUSCULAR | Status: DC | PRN

## 2021-07-08 MED ORDER — LIDOCAINE-PRILOCAINE 2.5-2.5 % EX CREA: 1.0000 "application " | TOPICAL_CREAM | CUTANEOUS | Status: DC | PRN

## 2021-07-08 MED ORDER — PENTAFLUOROPROP-TETRAFLUOROETH EX AERO: 1.0000 "application " | INHALATION_SPRAY | CUTANEOUS | Status: DC | PRN

## 2021-07-08 MED ORDER — CINACALCET HCL 30 MG PO TABS
60.0000 mg | ORAL_TABLET | Freq: Every evening | ORAL | Status: DC
  Administered 2021-07-09: 60 mg via ORAL
  Filled 2021-07-08 (×2): qty 2

## 2021-07-08 MED ORDER — FERRIC CITRATE 1 GM 210 MG(FE) PO TABS
210.0000 mg | ORAL_TABLET | Freq: Every day | ORAL | Status: DC | PRN
  Filled 2021-07-08: qty 1

## 2021-07-08 MED ORDER — CHLORHEXIDINE GLUCONATE CLOTH 2 % EX PADS
6.0000 | MEDICATED_PAD | Freq: Every day | CUTANEOUS | Status: DC
  Administered 2021-07-09 – 2021-07-10 (×2): 6 via TOPICAL

## 2021-07-08 MED ORDER — SEVELAMER CARBONATE 800 MG PO TABS
800.0000 mg | ORAL_TABLET | Freq: Three times a day (TID) | ORAL | Status: DC
  Administered 2021-07-10 – 2021-07-11 (×2): 800 mg via ORAL
  Filled 2021-07-08: qty 2
  Filled 2021-07-08: qty 1
  Filled 2021-07-08: qty 2
  Filled 2021-07-08 (×2): qty 1

## 2021-07-08 MED ORDER — MORPHINE SULFATE (PF) 4 MG/ML IV SOLN
3.0000 mg | INTRAVENOUS | Status: DC | PRN
  Administered 2021-07-08 – 2021-07-09 (×5): 3 mg via INTRAVENOUS
  Filled 2021-07-08 (×5): qty 1

## 2021-07-08 MED ORDER — SEVELAMER CARBONATE 800 MG PO TABS
800.0000 mg | ORAL_TABLET | Freq: Two times a day (BID) | ORAL | Status: DC | PRN
  Filled 2021-07-08 (×2): qty 1

## 2021-07-08 MED ORDER — HYDRALAZINE HCL 50 MG PO TABS
50.0000 mg | ORAL_TABLET | Freq: Three times a day (TID) | ORAL | Status: DC
  Filled 2021-07-08: qty 1

## 2021-07-08 MED ORDER — HEPARIN SODIUM (PORCINE) 1000 UNIT/ML DIALYSIS
2000.0000 [IU] | Freq: Once | INTRAMUSCULAR | Status: DC
  Filled 2021-07-08: qty 2

## 2021-07-08 MED ORDER — METOPROLOL TARTRATE 50 MG PO TABS
50.0000 mg | ORAL_TABLET | Freq: Two times a day (BID) | ORAL | Status: DC
  Administered 2021-07-08 – 2021-07-11 (×5): 50 mg via ORAL
  Filled 2021-07-08 (×3): qty 1
  Filled 2021-07-08: qty 2
  Filled 2021-07-08 (×3): qty 1

## 2021-07-08 NOTE — ED Notes (Signed)
Pt states he does not want to stay in the hospital for dialysis and wants to go home and do dialysis. Pt states he does not want to be in this room all night. Pt advised that due to his oxygen levels, tachycardia and GI bleeding that this is not a good idea. Pt insisting and wants to speak with MD. MD Noah Charon and made aware

## 2021-07-08 NOTE — ED Notes (Signed)
Called 3E asking for purple man information to be put in so handoff can be given

## 2021-07-08 NOTE — ED Notes (Signed)
Report called to Saintclair Halsted, RN and [facility] called for transport.

## 2021-07-08 NOTE — Progress Notes (Signed)
Report given to night nurse. Patient currently H/D

## 2021-07-08 NOTE — Significant Event (Signed)
Patient experiencing nose bleed Desating to 70s with oxygen in nose. Currently 95% on 4L.

## 2021-07-08 NOTE — Progress Notes (Signed)
UF decreased. 100 normal saline bolus given.

## 2021-07-08 NOTE — Progress Notes (Signed)
Patient with hypotension during treatment. UF goal was decreased, uf turned off, and normal saline bolus given. Bp did not improve. Dr. Justin Mend notified. Telephone orders were given to terminate treatment. Treatment terminated. Bp improved at end of treatment.

## 2021-07-08 NOTE — Significant Event (Signed)
Patient stated that he usually does not take any medications on dialysis days. Blood pressure low sys 86/65 Held this morning medications blood pressure currently 118/80. MD made aware. Plan for STAT CT scan and dialysis sometime today.

## 2021-07-08 NOTE — Progress Notes (Signed)
UF off. Bp rechecked.

## 2021-07-08 NOTE — ED Notes (Signed)
Pt refusing to keep nose canula in while oxygen is in the 70's and 80's. Pt states he will be fine without it for a while and says he would like to leave.

## 2021-07-08 NOTE — Progress Notes (Addendum)
[facility] PROGRESS NOTE    Progress Note  Thomas Robinson  OMV:[id number] DOB: Dec 15, 1960 DOA: 07/07/2021 PCP: [name], PA     Brief Narrative:   Thomas Robinson is an 60 y.o. male past medical history significant for chronic hypoxic respiratory failure on 2 L of oxygen, chronic diastolic heart failure end-stage renal disease on hemodialysis Monday Wednesday and Friday, she also has a history of a thoracic aortic aneurysm, atrial flutter status postconversion on Eliquis, essential hypertension who recently left [place] on 07/05/2021 during this admission he underwent thoracocentesis for right-sided pleural effusion which was transudative in the setting of pneumonia and septic shock after at the his house started developing bright red blood per rectum he stopped his Eliquis on his own started getting weak brought into the ED hypoxic, and atrial flutter and encephalopathy placed on 6 L of oxygen.  Hemoglobin of 10 (right about baseline).  FOBT positive, potassium 5, sodium 132 CO2 28 creatinine up to 7 (on the day he left AMA was 5) chest x-ray showed a small right-sided pleural effusion and resolving consolidation with started empirically on doxycycline and cefepime.    Assessment/Plan:   Acute on chronic respiratory failure with hypoxia of unclear etiology: Question right-sided pneumonia.  He did complete a 6-day course of antibiotics before he left AGAINST MEDICAL ADVICE. He was restarted on admission on doxycycline and cefepime. Patient is stable but continues to be significantly tachycardic.  Rectal bleeding/anemia of chronic disease: He was on Eliquis for atrial flutter which she stopped prior to coming to admission.  He relates bright red blood per rectum. Will let Gi know. FOBT was positive.  He relates he has hemorrhoids which are hurting he relates every time he has wiped there has been blood in the paper. Will continue to hold Eliquis is relatively  unchanged from previous.  Atrial flutter: Now back in atrial flutter cardiology was consulted during his last admission there was a discussion of about TEE cardioversion once he had recovered from his acute illness, but the patient has discontinued his Eliquis on his problem due to rectal bleeding. Consult cardiology for possible TEE cardioversion history of with a fast rate. KVO IV fluids he is definitely volume overloaded on physical exam.  High anion gap metabolic acidosis: Likely due to renal dysfunction. Started on low-dose IV fluids, will hold and KVO.  Chronic diastolic heart failure: Continue further management per HD.  Sick euthyroid syndrome: With initiation of 6.6 Free T4 unremarkable need to be rechecked in 6 weeks. Elevated LFTs: On his last admission it was greater than 2000 probably due to septic shock.  End-stage renal disease on hemodialysis. Will let nephrology know to continue hemodialysis Tuesdays Thursdays and Saturdays  Stage II sacral decubitus ulcer present on admission: RN Pressure Injury Documentation: Pressure Injury 07/01/21 Coccyx Mid Stage 2 -  Partial thickness loss of dermis presenting as a shallow open injury with a red, pink wound bed without slough. (Active)  07/01/21 1300  Location: Coccyx  Location Orientation: Mid  Staging: Stage 2 -  Partial thickness loss of dermis presenting as a shallow open injury with a red, pink wound bed without slough.  Wound Description (Comments):   Present on Admission: Yes     DVT prophylaxis: lovenox Family Communication:none Status is: Inpatient  Remains inpatient appropriate because: Acute severity of illness due to a flutter, volume overload        Code Status:     Code Status Orders  (From  admission, onward)           Start     Ordered   07/07/21 2322  Full code  Continuous        07/07/21 2322           Code Status History     Date Active Date Inactive Code Status Order ID  Comments User Context   06/30/2021 0411 07/05/2021 2033 Full Code [id number]  Renee Pain, MD ED   05/30/2021 2349 06/01/2021 2213 Full Code [id number]  Rise Patience, MD ED   05/18/2021 2128 05/19/2021 1831 Full Code [id number]  Shela Leff, MD ED   02/18/2021 0536 02/18/2021 1947 Full Code [id number]  Shalhoub, Sherryll Burger, MD ED   02/17/2021 1240 02/17/2021 2325 Full Code [id number]  Lequita Halt, MD ED   02/01/2021 1524 02/04/2021 1626 Full Code [id number]  Lacinda Axon, MD ED   01/12/2021 2044 01/15/2021 2035 Full Code [id number]  Etta Quill, DO ED   09/09/2020 2318 09/12/2020 1722 Full Code [id number]  Rise Patience, MD ED   09/04/2020 0757 09/05/2020 1413 Full Code [id number]  Edwin Dada, MD Inpatient   08/05/2020 1903 08/07/2020 1712 Full Code [id number]  Marcelyn Bruins, MD ED   07/03/2020 1431 07/03/2020 2356 Full Code [id number]  Cox, Hunter, DO Inpatient   02/08/2020 2101 02/09/2020 1458 Full Code [id number]  Etta Quill, DO ED   01/24/2020 0735 01/24/2020 2001 Full Code [id number]  Allie Dimmer, MD Inpatient   01/04/2020 1616 01/05/2020 1345 Full Code [id number]  Lequita Halt, MD Inpatient   01/04/2020 1616 01/04/2020 1616 Full Code [id number]  Melina Schools, MD Inpatient   06/04/2019 2059 06/07/2019 1118 Full Code [id number]  Ivor Costa, MD ED   05/16/2019 1702 05/18/2019 1422 Full Code [id number]  Kathrene Alu, MD ED   04/13/2018 2250 04/15/2018 1911 Full Code [id number]  Vianne Bulls, MD ED   02/16/2018 1626 02/17/2018 1520 Full Code [id number]  Welford Roche, MD ED   10/24/2017 0146 10/25/2017 0735 Full Code [id number]  Gwynne Edinger, MD ED   08/24/2017 0338 08/25/2017 0005 Full Code [id number]  Vilma Prader, MD Inpatient   05/24/2017 2140 06/02/2017 1225 Full Code [id number]  Vianne Bulls, MD ED   04/09/2015 2233 04/12/2015 2107 Full Code [id number]  Florencia Reasons, MD Inpatient         IV Access:   Peripheral IV   Procedures and diagnostic studies:    DG Chest Port 1 View  Result Date: 07/07/2021 CLINICAL DATA:  Altered mental status x2 days, questionable sepsis. EXAM: PORTABLE CHEST 1 VIEW COMPARISON:  Chest radiograph July 02, 2021 FINDINGS: Dual lumen right chest wall dialysis catheter with tip overlying the superior cavoatrial junction. The heart size and mediastinal contours are stable. Similar size of the small right pleural effusion. Interval increased aeration of the right lower lobe of a decrease in the right basilar consolidation. No new focal consolidation. The visualized skeletal structures are unchanged. IMPRESSION: 1. Similar size of the small right pleural effusion with interval improved aeration of the right lower lobe and decreased in the right basilar consolidation. No new focal consolidation. Electronically Signed   By: Dahlia Bailiff M.D.   On: 07/07/2021 20:45     Medical Consultants:   None.   Subjective:    Thomas Robinson has multiple complaints about everything, saying that he is not being treating correctly and not excepting any responsibility for leaving [place].  Objective:    Vitals:   07/08/21 0345 07/08/21 0400 07/08/21 0430 07/08/21 0500  BP:  133/89 (!) 133/99 126/90  Pulse: (!) 121 (!) 121 (!) 122 (!) 122  Resp: (!) 23 13 13  (!) 27  Temp:    97.6 F (36.4 C)  TempSrc:    Oral  SpO2: 98% 100% 100% 98%  Weight:    58.9 kg  Height:    5\' 8"  (1.727 m)   SpO2: 98 % O2 Flow Rate (L/min): 4 L/min  No intake or output data in the 24 hours ending 07/08/21 0820 Filed Weights   07/07/21 1948 07/07/21 1949 07/08/21 0500  Weight: 61.2 kg 61.2 kg 58.9 kg    Exam: General exam: In no acute distress, cachectic appearing Respiratory system: Good air movement and crackles at bases bilaterally Cardiovascular system: S1 & S2 heard, RRR.  Positive JVD Gastrointestinal system: Abdomen is nondistended, soft and nontender.  Extremities: No pedal edema. Skin: No rashes, lesions or  ulcers Psychiatry: He understands to treatment and his condition especially the treatment that is being proposed, but that his judgment and insight on his medical condition about leaving [place] make it seem like he has a poor judgment   Data Reviewed:    Labs: Basic Metabolic Panel: Recent Labs  Lab 07/02/21 0420 07/02/21 1600 07/03/21 0409 07/03/21 1331 07/04/21 0249 07/05/21 0614 07/07/21 2025 07/08/21 0608  NA 129* 131*   < > 131* 133* 132* 132* 134*  K 4.7 4.8   < > 5.4* 5.5* 3.9 5.1 3.8  CL 93* 97*   < > 98 99 96* 96* 98  CO2 17* 21*   < > 19* 19* 22 20* 18*  GLUCOSE 119* 158*   < > 147* 123* 104* 101* 90  BUN 62* 39*   < > 51* 58* 32* 58* 56*  CREATININE 7.90* 5.52*   < > 6.78* 7.22* 4.97* 7.79* 8.29*  CALCIUM 8.0* 8.3*   < > 8.3* 8.5* 8.7* 8.7* 8.7*  MG 2.6*  --   --   --   --   --   --   --   PHOS 8.1* 5.3*  --   --   --   --   --   --    < > = values in this interval not displayed.   GFR Estimated Creatinine Clearance: 7.7 mL/min (A) (by C-G formula based on SCr of 8.29 mg/dL (H)). Liver Function Tests: Recent Labs  Lab 07/03/21 0409 07/03/21 1331 07/04/21 0249 07/05/21 0614 07/07/21 2025  AST 2,105* 1,389* 738* 309* 102*  ALT 1,405* 1,296* 1,130* 888* 496*  ALKPHOS 78 79 85 118 159*  BILITOT 1.1 1.2 1.2 1.2 1.5*  PROT 5.7* 5.4* 5.5* 5.9* 6.8  ALBUMIN 2.3* 2.2* 2.2* 2.4* 3.1*   Recent Labs  Lab 07/01/21 1329  LIPASE 44  AMYLASE 93   No results for input(s): AMMONIA in the last 168 hours. Coagulation profile Recent Labs  Lab 07/01/21 1329 07/03/21 0412 07/04/21 1841 07/07/21 2159  INR 1.6* 2.1* 1.5* 1.6*   COVID-19 Labs  No results for input(s): DDIMER, FERRITIN, LDH, CRP in the last 72 hours.  Lab Results  Component Value Date   SARSCOV2NAA NEGATIVE 07/07/2021   [place] NEGATIVE 06/29/2021   [place] NEGATIVE 05/30/2021   [place] NEGATIVE 05/18/2021    CBC: Recent Labs  Lab 07/02/21 0420 07/03/21 0412  07/04/21 0249 07/07/21 2025 07/08/21 0608  WBC 7.2 7.4 9.6 7.2 8.7  HGB 9.1*  9.1* 9.9* 10.0* 9.3*  HCT 28.1* 28.9* 29.6* 31.0* 28.8*  MCV 101.8* 101.8* 98.3 104.7* 100.7*  PLT 122* 99* 125* 123* 105*   Cardiac Enzymes: Recent Labs  Lab 07/03/21 1031  CKTOTAL 30*   BNP (last 3 results) No results for input(s): PROBNP in the last 8760 hours. CBG: Recent Labs  Lab 07/04/21 2040 07/04/21 2336 07/05/21 0423 07/05/21 0814 07/05/21 1209  GLUCAP 137* 103* 124* 84 97   D-Dimer: No results for input(s): DDIMER in the last 72 hours. Hgb A1c: No results for input(s): HGBA1C in the last 72 hours. Lipid Profile: No results for input(s): CHOL, HDL, LDLCALC, TRIG, CHOLHDL, LDLDIRECT in the last 72 hours. Thyroid function studies: No results for input(s): TSH, T4TOTAL, T3FREE, THYROIDAB in the last 72 hours.  Invalid input(s): FREET3 Anemia work up: No results for input(s): VITAMINB12, FOLATE, FERRITIN, TIBC, IRON, RETICCTPCT in the last 72 hours. Sepsis Labs: Recent Labs  Lab 07/01/21 1335 07/02/21 0420 07/03/21 0412 07/04/21 0249 07/07/21 2025 07/07/21 2109 07/08/21 7824  PROCALCITON  --  6.55  --   --   --   --   --   WBC  --  7.2 7.4 9.6 7.2  --  8.7  LATICACIDVEN 2.6*  --   --   --   --  1.5 1.2   Microbiology Recent Results (from the past 240 hour(s))  Resp Panel by RT-PCR (Flu A&B, Covid) Nasopharyngeal Swab     Status: None   Collection Time: 06/29/21  9:56 PM   Specimen: Nasopharyngeal Swab; Nasopharyngeal(NP) swabs in vial transport medium  Result Value Ref Range Status   SARS Coronavirus 2 by RT PCR NEGATIVE NEGATIVE Final    Comment: (NOTE) SARS-CoV-2 target nucleic acids are NOT DETECTED.  The SARS-CoV-2 RNA is generally detectable in upper respiratory specimens during the acute phase of infection. The lowest concentration of SARS-CoV-2 viral copies this assay can detect is 138 copies/mL. A negative result does not preclude SARS-Cov-2 infection and  should not be used as the sole basis for treatment or other patient management decisions. A negative result may occur with  improper specimen collection/handling, submission of specimen other than nasopharyngeal swab, presence of viral mutation(s) within the areas targeted by this assay, and inadequate number of viral copies(<138 copies/mL). A negative result must be combined with clinical observations, patient history, and epidemiological information. The expected result is Negative.  Fact Sheet for Patients:  EntrepreneurPulse.com.au  Fact Sheet for Healthcare Providers:  IncredibleEmployment.be  This test is no t yet approved or cleared by the [place] FDA and  has been authorized for detection and/or diagnosis of SARS-CoV-2 by FDA under an Emergency Use Authorization (EUA). This EUA will remain  in effect (meaning this test can be used) for the duration of the COVID-19 declaration under Section 564(b)(1) of the Act, 21 U.S.C.section 360bbb-3(b)(1), unless the authorization is terminated  or revoked sooner.       Influenza A by PCR NEGATIVE NEGATIVE Final   Influenza B by PCR NEGATIVE NEGATIVE Final    Comment: (NOTE) The Xpert Xpress SARS-CoV-2/FLU/RSV plus assay is intended as an aid in the diagnosis of influenza from Nasopharyngeal swab specimens and should not be used as a sole basis for treatment. Nasal washings and aspirates are unacceptable for Xpert Xpress SARS-CoV-2/FLU/RSV testing.  Fact Sheet for Patients: EntrepreneurPulse.com.au  Fact Sheet for Healthcare Providers: IncredibleEmployment.be  This test is not yet approved or cleared by the [place] FDA and has been authorized for detection  and/or diagnosis of SARS-CoV-2 by FDA under an Emergency Use Authorization (EUA). This EUA will remain in effect (meaning this test can be used) for the duration of the COVID-19 declaration  under Section 564(b)(1) of the Act, 21 U.S.C. section 360bbb-3(b)(1), unless the authorization is terminated or revoked.  Performed at [facility], [place] [address], [place], [place] [address]   Blood culture (routine x 2)     Status: None   Collection Time: 06/30/21  3:52 AM   Specimen: BLOOD  Result Value Ref Range Status   Specimen Description   Final    BLOOD BLOOD LEFT FOREARM Performed at [facility] [address] [place], [place] [address]    Special Requests   Final    BOTTLES DRAWN AEROBIC AND ANAEROBIC Blood Culture adequate volume Performed at [facility] [address] [place], [place] [address]    Culture   Final    NO GROWTH 5 DAYS Performed at [facility], [place] [address], [place], [place] [address]    Report Status 07/05/2021 FINAL  Final  MRSA Next Gen by PCR, Nasal     Status: None   Collection Time: 06/30/21  5:17 AM   Specimen: Nasal Mucosa; Nasal Swab  Result Value Ref Range Status   MRSA by PCR Next Gen NOT DETECTED NOT DETECTED Final    Comment: (NOTE) The GeneXpert MRSA Assay (FDA approved for NASAL specimens only), is one component of a comprehensive MRSA colonization surveillance program. It is not intended to diagnose MRSA infection nor to guide or monitor treatment for MRSA infections. Test performance is not FDA approved in patients less than 47 years old. Performed at [facility], [place] [address] [place], [place] [address]   Blood culture (routine x 2)     Status: None   Collection Time: 06/30/21  6:58 AM   Specimen: BLOOD  Result Value Ref Range Status   Specimen Description   Final    BLOOD BLOOD LEFT HAND Performed at [facility] [address] [place], [place] [address]    Special Requests   Final    BOTTLES DRAWN AEROBIC AND ANAEROBIC Blood Culture adequate volume Performed at [facility] [address], [place], [place] [address]    Culture   Final    NO GROWTH 5 DAYS Performed at [facility], [place] [address] [place], [place] [address]    Report Status 07/05/2021 FINAL  Final  Respiratory (~20 pathogens) panel by PCR     Status: None   Collection Time: 07/01/21 12:01 PM   Specimen: Nasopharyngeal Swab; Respiratory  Result Value Ref Range Status   Adenovirus NOT DETECTED NOT DETECTED Final   Coronavirus 229E NOT DETECTED NOT DETECTED Final    Comment: (NOTE) The Coronavirus on the Respiratory Panel, DOES NOT test for the novel  Coronavirus (2019 nCoV)    Coronavirus HKU1 NOT DETECTED NOT DETECTED Final   Coronavirus NL63 NOT DETECTED NOT DETECTED Final   Coronavirus OC43 NOT DETECTED NOT DETECTED Final   Metapneumovirus NOT DETECTED NOT DETECTED Final   Rhinovirus / Enterovirus NOT DETECTED NOT DETECTED Final   Influenza A NOT DETECTED NOT DETECTED Final   Influenza B NOT DETECTED NOT DETECTED Final   Parainfluenza Virus 1 NOT DETECTED NOT DETECTED Final   Parainfluenza Virus 2 NOT DETECTED NOT DETECTED Final   Parainfluenza Virus 3 NOT DETECTED NOT DETECTED Final   Parainfluenza Virus  4 NOT DETECTED NOT DETECTED Final   Respiratory Syncytial Virus NOT DETECTED NOT DETECTED Final   Bordetella pertussis NOT DETECTED NOT DETECTED Final   Bordetella Parapertussis NOT DETECTED NOT DETECTED Final   Chlamydophila pneumoniae NOT DETECTED NOT DETECTED Final   Mycoplasma pneumoniae NOT DETECTED NOT DETECTED Final    Comment: Performed at [facility], [place] [address] [place], [place] [address]  Pleural fluid culture w Gram Stain     Status: None   Collection Time: 07/02/21  3:18 PM   Specimen: Pleural Fluid  Result Value Ref Range Status   Specimen Description FLUID PLEURAL  Final   Special Requests NONE  Final   Gram Stain   Final    RARE WBC PRESENT,BOTH PMN AND MONONUCLEAR NO ORGANISMS SEEN    Culture   Final    NO GROWTH 3 DAYS Performed at [place], [place] [address], [place], [place] [address]    Report Status 07/06/2021 FINAL  Final  Resp Panel by RT-PCR (Flu A&B, Covid) Nasopharyngeal Swab     Status: None   Collection Time: 07/07/21  8:52 PM   Specimen: Nasopharyngeal Swab; Nasopharyngeal(NP) swabs in vial transport medium  Result Value Ref Range Status   SARS Coronavirus 2 by RT PCR NEGATIVE NEGATIVE Final    Comment: (NOTE) SARS-CoV-2 target nucleic acids are NOT DETECTED.  The SARS-CoV-2 RNA is generally detectable in upper respiratory specimens during the acute phase of infection. The lowest concentration of SARS-CoV-2 viral copies this assay can detect is 138 copies/mL. A negative result does not preclude SARS-Cov-2 infection and should not be used as the sole basis for treatment or other patient management decisions. A negative result may occur with  improper specimen collection/handling, submission of specimen other than nasopharyngeal swab, presence of viral mutation(s) within the areas targeted by this assay, and inadequate number of viral copies(<138 copies/mL). A negative result must be combined with clinical observations, patient history, and epidemiological information. The expected result is Negative.  Fact Sheet for Patients:  EntrepreneurPulse.com.au  Fact Sheet for Healthcare Providers:  IncredibleEmployment.be  This test is no t yet approved or cleared by the [place] FDA and  has been authorized for detection and/or diagnosis of SARS-CoV-2 by FDA under an Emergency Use Authorization (EUA). This EUA will remain  in effect (meaning this test can be used) for the duration of the COVID-19 declaration under Section 564(b)(1) of the Act, 21 U.S.C.section 360bbb-3(b)(1), unless the authorization is terminated  or revoked sooner.       Influenza A by PCR NEGATIVE NEGATIVE Final   Influenza B by PCR NEGATIVE NEGATIVE Final    Comment: (NOTE) The Xpert Xpress  SARS-CoV-2/FLU/RSV plus assay is intended as an aid in the diagnosis of influenza from Nasopharyngeal swab specimens and should not be used as a sole basis for treatment. Nasal washings and aspirates are unacceptable for Xpert Xpress SARS-CoV-2/FLU/RSV testing.  Fact Sheet for Patients: EntrepreneurPulse.com.au  Fact Sheet for Healthcare Providers: IncredibleEmployment.be  This test is not yet approved or cleared by the [place] FDA and has been authorized for detection and/or diagnosis of SARS-CoV-2 by FDA under an Emergency Use Authorization (EUA). This EUA will remain in effect (meaning this test can be used) for the duration of the COVID-19 declaration under Section 564(b)(1) of the Act, 21 U.S.C. section 360bbb-3(b)(1), unless the authorization is terminated or revoked.  Performed at [facility], [place] [address] [place],  [address]  Medications:    amLODipine  10 mg Oral QHS   cinacalcet  60 mg Oral QPM   fluticasone furoate-vilanterol  1 puff Inhalation Daily   [START ON 07/09/2021] heparin  2,000 Units Dialysis Once in dialysis   hydrALAZINE  50 mg Oral TID   metoprolol tartrate  50 mg Oral BID   multivitamin  1 tablet Oral QHS   sevelamer carbonate  800-1,600 mg Oral TID with meals   Continuous Infusions:  cefTRIAXone (ROCEPHIN)  IV 1 g (07/08/21 0338)   doxycycline (VIBRAMYCIN) IV     lactated ringers Stopped (07/08/21 0329)      LOS: 1 day   Charlynne Cousins  [facility]  07/08/2021, 8:20 AM

## 2021-07-08 NOTE — Progress Notes (Signed)
Pt receives out-pt HD at [facility] on MWF. Pt arrives at 12:00 for 12:15 chair time. Will assist as needed.  Melven Sartorius Renal Navigator [phone]

## 2021-07-08 NOTE — Progress Notes (Signed)
   Pt seen by Pulm in [place], last visit 06/26/2020. Full note in Care Everywhere From that note:  Assessment   1. Asthma, unspecified asthma severity, unspecified whether complicated, unspecified whether persistent  2. Dyspnea, unspecified type  3. Encounter for screening laboratory testing for COVID-19 virus  4. Pre-procedure lab exam  5. Daytime hypersomnia  6. Snoring    60 year old, nonsmoker with asthma and a fixed severe obstruction on PFT today, ESRD on HD for 3 years, diastolic heart failure presents for evaluation regarding shortness of breath which is multifactorial including asthma and edema and underlying chronic diseases. He has a crowded oropharynx with daytime sleepiness and is at risk for sleep apnea. He is currently on oxygen at 2 L of nasal cannula. His oxygen at rest is 93%. Plan    Sleep study and cpap titration if needed (split if meets AASM criteria)  Start breo once a day  Albuterol as needed  PFT results:  There is a severe obstructive ventilatory defect. He has a severe fixed obstruction with an FEV1 of 41% of predicted with good bronchodilator response.  The total lung capacity is mildly reduced.  Air trapping is not seen.  The diffusing capacity is mildly reduced.  There is a significant response to bronchodilators.  No pulm eval or f/u since then.   Rosaria Ferries, PA-C 07/08/2021 1:03 PM

## 2021-07-08 NOTE — Progress Notes (Signed)
UF goal decreased for decreasing bp. 100 normal saline given.

## 2021-07-08 NOTE — Consult Note (Signed)
[facility]  INPATIENT CONSULTATION  Reason for Consultation: ESRD and assoc conditions co management  Requesting Provider: Dr. Aileen Fass  HPI: Thomas Robinson is an 60 y.o. male with CVA, chronic pain, HTN, HL, pHTN, RLS, ESRD on outpt HD MWF currently admitted with weakness, edema and rectal bleeding and nephrology is consulted for evaluation and management of his ESRD and assoc conditions.    Recently admitted [facility] 10/30-11/4 with shock secondary PNA and acute on chronic hypoxic respiratory failure.  Had thoracentesis for R pleural effusion (transudative).  Dx with new atrial flutter tx with BB and eliquis.  After her left AMA on 11/4 he's had increasing weakness and BRBPR.  Stopped eliquis.    Came into ED via EMS - confused, hypoxic with O2 sat to 85% on baseline O2.  FOBT +.  Hb 10s and 9.3 this AM. CXR with small R pleural effusion and resolving consolidation.  Admitted on doxycycline and cefepime.   PMH: Past Medical History:  Diagnosis Date   Anaphylactic shock, unspecified, sequela 06/10/2019   Aortic stenosis    mild-moderate AS 12/2020 echo   Arthritis    Asthma, chronic, unspecified asthma severity, with acute exacerbation 10/23/2017   CAP (community acquired pneumonia) 10/23/2017   Carpal tunnel syndrome of right wrist 10/05/2018   Cataract    right - removed by surgery   Cerebral infarction due to thrombosis of cerebral artery (HCC)    Cervical disc herniation 01/04/2020   Chronic diastolic heart failure ([place]) 04/13/2018   Chronic low back pain 11/24/2019   Constipation    COPD (chronic obstructive pulmonary disease) (HCC)    Cough    chronic cough   Dyspnea    Encounter for immunization 07/08/2017   ESRD on hemodialysis ([place]) 02/16/2018   Fall 06/04/2019   GERD (gastroesophageal reflux disease) 06/04/2019   GI bleeding 05/24/2017   Gram-negative sepsis, unspecified ([place]) 09/05/2017   History of fusion of cervical spine 03/26/2020    Hyperlipidemia    Hypertension    Hypokalemia 06/04/2017   Iron deficiency anemia, unspecified 09/09/2017   Left shoulder pain 11/11/2019   Lumbar radiculopathy 11/24/2019   Lung contusion 06/04/2019   LVH (left ventricular hypertrophy) due to hypertensive disease, with heart failure ([place]) 06/18/2020   Macrocytic anemia 05/24/2017   Moderate protein-calorie malnutrition ([place]) 06/06/2017   Myofascial pain syndrome 06/12/2020   Neuritis of right ulnar nerve 09/13/2018   Non-compliance with renal dialysis ([place]) 02/08/2020   Oxygen deficiency 12/30/2019   O2 sats on RA 87% at PAT appt    Pain in joint of right elbow 09/13/2018   Pulmonary hypertension ([place])    Renovascular hypertension 06/22/2020   Restless leg syndrome    Rib fractures 06/2019   Right   S/P cardiac cath 08/27/2020   normal coronary arteries.   Secondary hyperparathyroidism of renal origin ([place]) 06/04/2017   Thoracic ascending aortic aneurysm    4.5 cm 06/04/19 CT   Ulnar neuropathy 10/05/2018   Volume overload 10/23/2017   PSH: Past Surgical History:  Procedure Laterality Date   A/V FISTULAGRAM N/A 01/01/2021   Procedure: A/V FISTULAGRAM;  Surgeon: Serafina Mitchell, MD;  Location: [place] [facility];  Service: Cardiovascular;  Laterality: N/A;   A/V FISTULAGRAM N/A 01/15/2021   Procedure: A/V VHQIONGEXBM;  Surgeon: Serafina Mitchell, MD;  Location: [place] [facility];  Service: Cardiovascular;  Laterality: N/A;   ANTERIOR CERVICAL DECOMP/DISCECTOMY FUSION N/A 01/04/2020   Procedure: ANTERIOR CERVICAL DECOMPRESSION/DISCECTOMY FUSION CERVICAL FIVE THROUGH  SEVEN;  Surgeon: Melina Schools, MD;  Location: [place];  Service: Orthopedics;  Laterality: N/A;  3 hrs   AV FISTULA PLACEMENT Left 05/28/2017   Procedure: LEFT ARM ARTERIOVENOUS (AV) FISTULA CREATION;  Surgeon: Conrad Hardin, MD;  Location: [place];  Service: Vascular;  Laterality: Left;   AV FISTULA PLACEMENT Left 02/02/2021   Procedure: Debride left forearm, ligation of  left upper arm Aretiovenous fistula;  Surgeon: Elam Dutch, MD;  Location: [facility];  Service: Vascular;  Laterality: Left;   AV FISTULA PLACEMENT Right 04/04/2021   Procedure: RIGHT ARTERIOVENOUS (AV) FISTULA CREATION;  Surgeon: Serafina Mitchell, MD;  [facility];  Service: Vascular;  Laterality: Right;   EYE SURGERY Right 06/02/2019   Cataract removed   FRACTURE SURGERY     INSERTION OF DIALYSIS CATHETER Right 02/02/2021   Procedure: INSERTION OF Right Internal jugular TUNNELED  DIALYSIS CATHETER;  Surgeon: Elam Dutch, MD;  Location: [place];  Service: Vascular;  Laterality: Right;   IR DIALY SHUNT INTRO NEEDLE/INTRACATH INITIAL W/IMG LEFT Left 09/12/2020   IR FLUORO GUIDE CV LINE RIGHT  05/25/2017   IR US GUIDE VASC ACCESS LEFT  09/12/2020   IR US GUIDE VASC ACCESS RIGHT  05/25/2017   LIGATION OF ARTERIOVENOUS  FISTULA Left 01/10/2021   Procedure: LIGATION OF LEFT ARM RADIOCEPHALIC FISTULA;  Surgeon: Serafina Mitchell, MD;  Location: [place];  Service: Vascular;  Laterality: Left;   PERIPHERAL VASCULAR BALLOON ANGIOPLASTY Left 01/15/2021   Procedure: PERIPHERAL VASCULAR BALLOON ANGIOPLASTY;  Surgeon: Serafina Mitchell, MD;  Location: [place] [facility];  Service: Cardiovascular;  Laterality: Left;  AVF   REVISON OF ARTERIOVENOUS FISTULA Left 07/18/2019   Procedure: REVISION PLICATION OF RADIOCEPHALIC ARTERIOVENOUS FISTULA LEFT ARM;  Surgeon: Angelia Mould, MD;  Location: [facility] OR;  Service: Vascular;  Laterality: Left;   REVISON OF ARTERIOVENOUS FISTULA Left 01/10/2021   Procedure: CONVERSION TO BRACHIOCEPHALIC ARTERIOVENOUS FISTULA;  Surgeon: Serafina Mitchell, MD;  [facility];  Service: Vascular;  Laterality: Left;   RINOPLASTY      Past Medical History:  Diagnosis Date   Anaphylactic shock, unspecified, sequela 06/10/2019   Aortic stenosis    mild-moderate AS 12/2020 echo   Arthritis    Asthma, chronic, unspecified asthma severity, with acute exacerbation 10/23/2017   CAP  (community acquired pneumonia) 10/23/2017   Carpal tunnel syndrome of right wrist 10/05/2018   Cataract    right - removed by surgery   Cerebral infarction due to thrombosis of cerebral artery ([place])    Cervical disc herniation 01/04/2020   Chronic diastolic heart failure ([place]) 04/13/2018   Chronic low back pain 11/24/2019   Constipation    COPD (chronic obstructive pulmonary disease) ([place])    Cough    chronic cough   Dyspnea    Encounter for immunization 07/08/2017   ESRD on hemodialysis ([place]) 02/16/2018   Fall 06/04/2019   GERD (gastroesophageal reflux disease) 06/04/2019   GI bleeding 05/24/2017   Gram-negative sepsis, unspecified ([place]) 09/05/2017   History of fusion of cervical spine 03/26/2020   Hyperlipidemia    Hypertension    Hypokalemia 06/04/2017   Iron deficiency anemia, unspecified 09/09/2017   Left shoulder pain 11/11/2019   Lumbar radiculopathy 11/24/2019   Lung contusion 06/04/2019   LVH (left ventricular hypertrophy) due to hypertensive disease, with heart failure ([place]) 06/18/2020   Macrocytic anemia 05/24/2017   Moderate protein-calorie malnutrition ([place]) 06/06/2017   Myofascial pain syndrome 06/12/2020   Neuritis of right  ulnar nerve 09/13/2018   Non-compliance with renal dialysis ([place]) 02/08/2020   Oxygen deficiency 12/30/2019   O2 sats on RA 87% at PAT appt    Pain in joint of right elbow 09/13/2018   Pulmonary hypertension ([place])    Renovascular hypertension 06/22/2020   Restless leg syndrome    Rib fractures 06/2019   Right   S/P cardiac cath 08/27/2020   normal coronary arteries.   Secondary hyperparathyroidism of renal origin ([place]) 06/04/2017   Thoracic ascending aortic aneurysm    4.5 cm 06/04/19 CT   Ulnar neuropathy 10/05/2018   Volume overload 10/23/2017    Medications:  I have reviewed the patient's current medications.   Medications Prior to Admission  Medication Sig Dispense Refill   acetaminophen (TYLENOL) 500 MG tablet Take 1,000  mg by mouth every 6 (six) hours as needed for mild pain, moderate pain or headache.     albuterol (VENTOLIN HFA) 108 (90 Base) MCG/ACT inhaler Inhale 2 puffs into the lungs 2 (two) times daily as needed for shortness of breath or wheezing. 1 each 1   amLODipine (NORVASC) 10 MG tablet Take 1 tablet (10 mg total) by mouth daily. (Patient taking differently: Take 10 mg by mouth at bedtime.) 30 tablet 1   amoxicillin-clavulanate (AUGMENTIN) 500-125 MG tablet Take 1 tablet (500 mg total) by mouth every evening for 3 days. 3 tablet 0   apixaban (ELIQUIS) 2.5 MG TABS tablet Take 1 tablet (2.5 mg total) by mouth 2 (two) times daily. 60 tablet 0   BREO ELLIPTA 100-25 MCG/INH AEPB Inhale 1 puff into the lungs daily. (Patient taking differently: Inhale 1 puff into the lungs daily as needed (shortness of breath).) 30 each 1   cinacalcet (SENSIPAR) 60 MG tablet Take 1 tablet (60 mg total) by mouth every evening. 30 tablet 1   ferric citrate (AURYXIA) 1 GM 210 MG(Fe) tablet Take 210 mg by mouth See admin instructions. Take one tablet (210 mg) by mouth occasionally     furosemide (LASIX) 80 MG tablet Take 1 tablet (80 mg total) by mouth daily as needed for fluid (Use only on non-HD days (Tues/Thur/Sat/Sun)). (Patient taking differently: Take 80 mg by mouth every morning.) 30 tablet 1   metoprolol tartrate (LOPRESSOR) 50 MG tablet Take 1 tablet (50 mg total) by mouth 2 (two) times daily. 60 tablet 1   midodrine (PROAMATINE) 10 MG tablet Take 1 tablet (10 mg total) by mouth 3 (three) times daily with meals. (Patient taking differently: Take 10 mg by mouth 2 (two) times daily.) 90 tablet 0   multivitamin (RENA-VIT) TABS tablet Take 1 tablet by mouth at bedtime.     sevelamer carbonate (RENVELA) 800 MG tablet Take 800-1,600 mg by mouth See admin instructions. Take 1-2 tablets (252-209-0010 mg) by mouth three times daily with meals, take 1 tablet (800 mg) with snacks     hydrALAZINE (APRESOLINE) 25 MG tablet Take 2 tablets (50  mg total) by mouth 3 (three) times daily. (Patient not taking: No sig reported) 90 tablet 1    ALLERGIES:   Allergies  Allergen Reactions   Vancomycin Shortness Of Breath and Itching    FAM HX: Family History  Problem Relation Age of Onset   Cancer Mother     Social History:   reports that he has never smoked. He has never used smokeless tobacco. He reports that he does not currently use alcohol. He reports that he does not use drugs.  ROS: 12 system ROS neg except per  HPI above  Blood pressure 126/90, pulse (!) 122, temperature 97.6 F (36.4 C), temperature source Oral, resp. rate (!) 27, height 5\' 8"  (1.727 m), weight 58.9 kg, SpO2 98 %. PHYSICAL EXAM: Gen: frail and chronically ill but nontoxic appearing  Eyes: anicteric, EOMI ENT:MMM Neck: supple CV:  tachycardic, no rub Abd: soft, thin Lungs: rales in BL bases, normal WOB on [place] O2 GU: condom cath Extr: 2+ pitting ankle and 1+ tibial edema; RUE nontransposed BB AVF +t/b, small Neuro: conversant   Results for orders placed or performed during the hospital encounter of 07/07/21 (from the past 48 hour(s))  Comprehensive metabolic panel     Status: Abnormal   Collection Time: 07/07/21  8:25 PM  Result Value Ref Range   Sodium 132 (L) 135 - 145 mmol/L   Potassium 5.1 3.5 - 5.1 mmol/L   Chloride 96 (L) 98 - 111 mmol/L   CO2 20 (L) 22 - 32 mmol/L   Glucose, Bld 101 (H) 70 - 99 mg/dL    Comment: Glucose reference range applies only to samples taken after fasting for at least 8 hours.   BUN 58 (H) 8 - 23 mg/dL   Creatinine, Ser 7.79 (H) 0.61 - 1.24 mg/dL   Calcium 8.7 (L) 8.9 - 10.3 mg/dL   Total Protein 6.8 6.5 - 8.1 g/dL   Albumin 3.1 (L) 3.5 - 5.0 g/dL   AST 102 (H) 15 - 41 U/L   ALT 496 (H) 0 - 44 U/L   Alkaline Phosphatase 159 (H) 38 - 126 U/L   Total Bilirubin 1.5 (H) 0.3 - 1.2 mg/dL   GFR, Estimated 7 (L) >60 mL/min    Comment: (NOTE) Calculated using the CKD-EPI Creatinine Equation (2021)    Anion gap 16  (H) 5 - 15    Comment: Performed at [facility], [place] [address] [place], [place] [address]  CBC WITH DIFFERENTIAL     Status: Abnormal   Collection Time: 07/07/21  8:25 PM  Result Value Ref Range   WBC 7.2 4.0 - 10.5 K/uL    Comment: ADJUSTED FOR NUCLEATED RBC'S   RBC 2.96 (L) 4.22 - 5.81 MIL/uL   Hemoglobin 10.0 (L) 13.0 - 17.0 g/dL   HCT 31.0 (L) 39.0 - 52.0 %   MCV 104.7 (H) 80.0 - 100.0 fL   MCH 33.8 26.0 - 34.0 pg   MCHC 32.3 30.0 - 36.0 g/dL   RDW 18.8 (H) 11.5 - 15.5 %   Platelets 123 (L) 150 - 400 K/uL   Neutrophils Relative % 90 %   Band Neutrophils 0 %   Lymphocytes Relative 7 %   Monocytes Relative 3 %   Eosinophils Relative 0 %   Basophils Relative 0 %   WBC Morphology NORMAL    RBC Morphology NORMAL    Smear Review DONE    nRBC 8 (H) 0 /100 WBC   Metamyelocytes Relative NONE SEEN %   Myelocytes NONE SEEN %   Promyelocytes Relative NONE SEEN %   Blasts NONE SEEN %    Comment: Performed at [facility], [place] [address] [place], [place] [address]  Type and screen     Status: None   Collection Time: 07/07/21  8:26 PM  Result Value Ref Range   ABO/RH(D) A POS    Antibody Screen NEG    Sample Expiration      07/10/2021,2359 Performed at [facility], [place] [address], [place], [place] [address]   Resp  Panel by RT-PCR (Flu A&B, Covid) Nasopharyngeal Swab     Status: None   Collection Time: 07/07/21  8:52 PM   Specimen: Nasopharyngeal Swab; Nasopharyngeal(NP) swabs in vial transport medium  Result Value Ref Range   SARS Coronavirus 2 by RT PCR NEGATIVE NEGATIVE    Comment: (NOTE) SARS-CoV-2 target nucleic acids are NOT DETECTED.  The SARS-CoV-2 RNA is generally detectable in upper respiratory specimens during the acute phase of infection. The lowest concentration of SARS-CoV-2 viral copies this assay can detect is 138 copies/mL. A negative result does not preclude SARS-Cov-2 infection and should not be  used as the sole basis for treatment or other patient management decisions. A negative result may occur with  improper specimen collection/handling, submission of specimen other than nasopharyngeal swab, presence of viral mutation(s) within the areas targeted by this assay, and inadequate number of viral copies(<138 copies/mL). A negative result must be combined with clinical observations, patient history, and epidemiological information. The expected result is Negative.  Fact Sheet for Patients:  EntrepreneurPulse.com.au  Fact Sheet for Healthcare Providers:  IncredibleEmployment.be  This test is no t yet approved or cleared by the [place] FDA and  has been authorized for detection and/or diagnosis of SARS-CoV-2 by FDA under an Emergency Use Authorization (EUA). This EUA will remain  in effect (meaning this test can be used) for the duration of the COVID-19 declaration under Section 564(b)(1) of the Act, 21 U.S.C.section 360bbb-3(b)(1), unless the authorization is terminated  or revoked sooner.       Influenza A by PCR NEGATIVE NEGATIVE   Influenza B by PCR NEGATIVE NEGATIVE    Comment: (NOTE) The Xpert Xpress SARS-CoV-2/FLU/RSV plus assay is intended as an aid in the diagnosis of influenza from Nasopharyngeal swab specimens and should not be used as a sole basis for treatment. Nasal washings and aspirates are unacceptable for Xpert Xpress SARS-CoV-2/FLU/RSV testing.  Fact Sheet for Patients: EntrepreneurPulse.com.au  Fact Sheet for Healthcare Providers: IncredibleEmployment.be  This test is not yet approved or cleared by the [place] FDA and has been authorized for detection and/or diagnosis of SARS-CoV-2 by FDA under an Emergency Use Authorization (EUA). This EUA will remain in effect (meaning this test can be used) for the duration of the COVID-19 declaration under Section 564(b)(1) of the  Act, 21 U.S.C. section 360bbb-3(b)(1), unless the authorization is terminated or revoked.  Performed at [facility], [place] [address], [place], [place] [address]   POC occult blood, ED     Status: Abnormal   Collection Time: 07/07/21  8:57 PM  Result Value Ref Range   Fecal Occult Bld POSITIVE (A) NEGATIVE  Lactic acid, plasma     Status: None   Collection Time: 07/07/21  9:09 PM  Result Value Ref Range   Lactic Acid, Venous 1.5 0.5 - 1.9 mmol/L    Comment: Performed at [facility], [place] [address] [place], [place] [address]  Protime-INR     Status: Abnormal   Collection Time: 07/07/21  9:59 PM  Result Value Ref Range   Prothrombin Time 19.0 (H) 11.4 - 15.2 seconds   INR 1.6 (H) 0.8 - 1.2    Comment: (NOTE) INR goal varies based on device and disease states. Performed at [facility], [place] [address] [place], [place] [address]   APTT     Status: Abnormal   Collection Time: 07/07/21  9:59 PM  Result Value Ref Range   aPTT 38 (H) 24 - 36 seconds  Comment:        IF BASELINE aPTT IS ELEVATED, SUGGEST PATIENT RISK ASSESSMENT BE USED TO DETERMINE APPROPRIATE ANTICOAGULANT THERAPY. Performed at [facility], [place] [address], [place], [place] [address]   Blood gas, venous (at [facility] and [facility], not at [facility])     Status: Abnormal   Collection Time: 07/07/21 10:00 PM  Result Value Ref Range   pH, Ven 7.457 (H) 7.250 - 7.430   pCO2, Ven 26.0 (L) 44.0 - 60.0 mmHg   pO2, Ven 202.0 (H) 32.0 - 45.0 mmHg   Bicarbonate 18.1 (L) 20.0 - 28.0 mmol/L   Acid-base deficit 4.4 (H) 0.0 - 2.0 mmol/L   O2 Saturation 99.7 %   Patient temperature 98.6     Comment: Performed at [facility], [place] [address], [place], [place] [address]  Lactic acid, plasma     Status: None   Collection Time: 07/08/21  6:08 AM  Result Value Ref Range   Lactic Acid, Venous 1.2 0.5 - 1.9 mmol/L    Comment: Performed at [place] [address], [place], [place] [address]  CBC     Status: Abnormal   Collection Time: 07/08/21  6:08 AM  Result Value Ref Range   WBC 8.7 4.0 - 10.5 K/uL   RBC 2.86 (L) 4.22 - 5.81 MIL/uL   Hemoglobin 9.3 (L) 13.0 - 17.0 g/dL   HCT 28.8 (L) 39.0 - 52.0 %   MCV 100.7 (H) 80.0 - 100.0 fL   MCH 32.5 26.0 - 34.0 pg   MCHC 32.3 30.0 - 36.0 g/dL   RDW 19.0 (H) 11.5 - 15.5 %   Platelets 105 (L) 150 - 400 K/uL    Comment: Immature Platelet Fraction may be clinically indicated, consider ordering this additional test [id number] REPEATED TO VERIFY PLATELET COUNT CONFIRMED BY SMEAR    nRBC 6.2 (H) 0.0 - 0.2 %    Comment: Performed at [facility], [place] [address], [place], [place] [address]  Basic metabolic panel     Status: Abnormal   Collection Time: 07/08/21  6:08 AM  Result Value Ref Range   Sodium 134 (L) 135 - 145 mmol/L   Potassium 3.8 3.5 - 5.1 mmol/L   Chloride 98 98 - 111 mmol/L   CO2 18 (L) 22 - 32 mmol/L   Glucose, Bld 90 70 - 99 mg/dL    Comment: Glucose reference range applies only to samples taken after fasting for at least 8 hours.   BUN 56 (H) 8 - 23 mg/dL   Creatinine, Ser 8.29 (H) 0.61 - 1.24 mg/dL   Calcium 8.7 (L) 8.9 - 10.3 mg/dL   GFR, Estimated 7 (L) >60 mL/min    Comment: (NOTE) Calculated using the CKD-EPI Creatinine Equation (2021)    Anion gap 18 (H) 5 - 15    Comment: Performed at [place] [address] [place], [place] [address]  Type and screen [place]     Status: None   Collection Time: 07/08/21  6:15 AM  Result Value Ref Range   ABO/RH(D) A POS    Antibody Screen NEG    Sample Expiration      07/11/2021,2359 Performed at [facility], [place] [address] [place], [place] [address]     DG Chest Port 1 View  Result Date: 07/07/2021 CLINICAL DATA:  Altered mental status x2 days, questionable sepsis. EXAM: PORTABLE CHEST 1 VIEW COMPARISON:  Chest radiograph  July 02, 2021 FINDINGS: Dual lumen  right chest wall dialysis catheter with tip overlying the superior cavoatrial junction. The heart size and mediastinal contours are stable. Similar size of the small right pleural effusion. Interval increased aeration of the right lower lobe of a decrease in the right basilar consolidation. No new focal consolidation. The visualized skeletal structures are unchanged. IMPRESSION: 1. Similar size of the small right pleural effusion with interval improved aeration of the right lower lobe and decreased in the right basilar consolidation. No new focal consolidation. Electronically Signed   By: Dahlia Bailiff M.D.   On: 07/07/2021 20:45    OP HD: MWF AF  4h  400/800  50kg   2/2 bath  Heparin 2000   RIJ TDC/ RUE AVF (maturing)  - sensipar 60 tiw  - venofer 50 weekly  - mircera 225 q2, last 10/19, due 11/2   Assessment/ Plan: Acute on chronic hypoxic resp failure: being treated for ongoing PNA.  CXR improving and no report of pulm edema but will UF with HD today to see if that helps.  ESRD on HD: MWF.  HD today via [place] 4K/2.5Ca.  Hold heparin in light of c/f GIB.  UF to EDW if can weigh otherwise 2-3L as tolerated - BP with AFlutter may not tolerate; may need midodrine.  Anemia:  Hb in 9s today, ? GIB.  Dosed with aranesp 200 11/3.  Transfused < 7.  Atrial flutter: recent dx.  On BB currently tachycardic into 120s.  Per Primary. Cardiology consulted during last admission.  BMM: on home binder, sensipar, renal diet.     Justin Mend 07/08/2021, 9:13 AM

## 2021-07-08 NOTE — Progress Notes (Signed)
UF goal and BFR decreaseddue to heart rate per verbal order from Juanell Fairly  at bedside.

## 2021-07-08 NOTE — Progress Notes (Signed)
TRH night cross cover note:  I was notified by RN of patient's request for pain medication relating to back pain.  Patient conveyed history of similar back discomfort, and notes that morphine has traditionally been effective with associated pain relief.  Subsequently,, I placed order for as needed IV morphine as well as order for as needed Narcan.   Babs Bertin, DO Hospitalist

## 2021-07-08 NOTE — Consult Note (Signed)
Cardiology Consultation:   Patient ID: Thomas Robinson MRN: [id number]; DOB: Jun 06, 1961  Admit date: 07/07/2021 Date of Consult: 07/08/2021  PCP:  [name], PA   [facility] Providers Cardiologist:  [name], DO        Patient Profile:   Thomas Robinson is a 60 y.o. male with a hx of  of ESRD on hemodialysis(M-W-F), HTN, asthma, CVA, mild CAD by cardiac CT 3662, diastolic heart failure w/ EF 70-75% by echo 07/02/2021, anemia of chronic disease, proximal ascending aorta dilatation measuring 4.2 cm 06/30/2021 CTA, COPD on home O2, who is being seen 07/08/2021 for the evaluation of atrial fib at the request of Dr Aileen Fass.  History of Present Illness:   Thomas Robinson was admitted 10/29-11/11/2020 for SOB, hypoxia, dx PNA, had effusion w/ thoracentesis (transudative), on ABX, req pressors for a time. Had abnl LFTs. Improved and O2 down to 4 lpm. Tx to telemetry 11/03, left AMA 11/04.   He was in atrial flutter that admit, Cards saw 11/03. Started on Eliquis, renal dose, outpt TEE/DCCV planned. He was d/c'd on Lopressor 50 mg bid, midodrine and low-dose Eliquis.  He was admitted 11/06 with BRBPR, weakness and LE edema. Pt had d/c'd Eliquis pta due to BRBPR and feeling weak. Denies missing HD. Cards asked to see because of the atrial flutter.   Thomas Robinson is not really aware of the atrial flutter.  He does not know when his heart goes too fast or too slow.  He has felt extremely weak, but no presyncope or syncope.  He attributes the weakness to the Eliquis.  He said he felt so bad yesterday he had to go to bed.  Has a hemorrhoid and that is most likely where the bleeding is coming from.  The hemorrhoid is very swollen and painful.  His feet were swollen and purple.  The swelling and discoloration has improved.  He admits that he had been sitting with his legs down for a long time.  He is frustrated because he has a dialysis access in his right subclavian, but he also has a  maturing fistula in his right upper arm.  He wants to get the HD access in his neck removed.  He has not had fevers or chills since 11/04 discharge.  He essentially feels his body is betraying him, because he has so many ongoing medical problems.  He wants to get back feeling better and go to the gym    Past Medical History:  Diagnosis Date   Anaphylactic shock, unspecified, sequela 06/10/2019   Aortic stenosis    mild-moderate AS 12/2020 echo   Arthritis    Asthma, chronic, unspecified asthma severity, with acute exacerbation 10/23/2017   CAP (community acquired pneumonia) 10/23/2017   Carpal tunnel syndrome of right wrist 10/05/2018   Cataract    right - removed by surgery   Cerebral infarction due to thrombosis of cerebral artery (HCC)    Cervical disc herniation 01/04/2020   Chronic diastolic heart failure ([place]) 04/13/2018   Chronic low back pain 11/24/2019   Constipation    COPD (chronic obstructive pulmonary disease) (HCC)    Cough    chronic cough   Dyspnea    Encounter for immunization 07/08/2017   ESRD on hemodialysis ([place]) 02/16/2018   Fall 06/04/2019   GERD (gastroesophageal reflux disease) 06/04/2019   GI bleeding 05/24/2017   Gram-negative sepsis, unspecified ([place]) 09/05/2017   History of fusion of cervical spine 03/26/2020   Hyperlipidemia  Hypertension    Hypokalemia 06/04/2017   Iron deficiency anemia, unspecified 09/09/2017   Left shoulder pain 11/11/2019   Lumbar radiculopathy 11/24/2019   Lung contusion 06/04/2019   LVH (left ventricular hypertrophy) due to hypertensive disease, with heart failure ([place]) 06/18/2020   Macrocytic anemia 05/24/2017   Moderate protein-calorie malnutrition ([place]) 06/06/2017   Myofascial pain syndrome 06/12/2020   Neuritis of right ulnar nerve 09/13/2018   Non-compliance with renal dialysis ([place]) 02/08/2020   Oxygen deficiency 12/30/2019   O2 sats on RA 87% at PAT appt    Pain in joint of right elbow 09/13/2018    Pulmonary hypertension ([place])    Renovascular hypertension 06/22/2020   Restless leg syndrome    Rib fractures 06/2019   Right   S/P cardiac cath 08/27/2020   normal coronary arteries.   Secondary hyperparathyroidism of renal origin ([place]) 06/04/2017   Thoracic ascending aortic aneurysm    4.5 cm 06/04/19 CT   Ulnar neuropathy 10/05/2018   Volume overload 10/23/2017    Past Surgical History:  Procedure Laterality Date   A/V FISTULAGRAM N/A 01/01/2021   Procedure: A/V FISTULAGRAM;  Surgeon: Serafina Mitchell, MD;  Location: [facility];  Service: Cardiovascular;  Laterality: N/A;   A/V FISTULAGRAM N/A 01/15/2021   Procedure: A/V [id number];  Surgeon: Serafina Mitchell, MD;  Location: [facility];  Service: Cardiovascular;  Laterality: N/A;   ANTERIOR CERVICAL DECOMP/DISCECTOMY FUSION N/A 01/04/2020   Procedure: ANTERIOR CERVICAL DECOMPRESSION/DISCECTOMY FUSION CERVICAL FIVE THROUGH SEVEN;  Surgeon: Melina Schools, MD;  Location: [place];  Service: Orthopedics;  Laterality: N/A;  3 hrs   AV FISTULA PLACEMENT Left 05/28/2017   Procedure: LEFT ARM ARTERIOVENOUS (AV) FISTULA CREATION;  Surgeon: Conrad [place], MD;  Location: [place];  Service: Vascular;  Laterality: Left;   AV FISTULA PLACEMENT Left 02/02/2021   Procedure: Debride left forearm, ligation of left upper arm Aretiovenous fistula;  Surgeon: Elam Dutch, MD;  Location[facility];  Service: Vascular;  Laterality: Left;   AV FISTULA PLACEMENT Right 04/04/2021   Procedure: RIGHT ARTERIOVENOUS (AV) FISTULA CREATION;  Surgeon: Serafina Mitchell, MD;  Location: [facility] OR;  Service: Vascular;  Laterality: Right;   EYE SURGERY Right 06/02/2019   Cataract removed   FRACTURE SURGERY     INSERTION OF DIALYSIS CATHETER Right 02/02/2021   Procedure: INSERTION OF Right Internal jugular TUNNELED  DIALYSIS CATHETER;  Surgeon: Elam Dutch, MD;  Location: [place];  Service: Vascular;  Laterality: Right;   IR DIALY SHUNT INTRO NEEDLE/INTRACATH INITIAL  W/IMG LEFT Left 09/12/2020   IR FLUORO GUIDE CV LINE RIGHT  05/25/2017   IR US GUIDE VASC ACCESS LEFT  09/12/2020   IR US GUIDE VASC ACCESS RIGHT  05/25/2017   LIGATION OF ARTERIOVENOUS  FISTULA Left 01/10/2021   Procedure: LIGATION OF LEFT ARM RADIOCEPHALIC FISTULA;  Surgeon: Serafina Mitchell, MD;  Location: [place];  Service: Vascular;  Laterality: Left;   PERIPHERAL VASCULAR BALLOON ANGIOPLASTY Left 01/15/2021   Procedure: PERIPHERAL VASCULAR BALLOON ANGIOPLASTY;  Surgeon: Serafina Mitchell, MD;  Location: [facility];  Service: Cardiovascular;  Laterality: Left;  AVF   REVISON OF ARTERIOVENOUS FISTULA Left 07/18/2019   Procedure: REVISION PLICATION OF RADIOCEPHALIC ARTERIOVENOUS FISTULA LEFT ARM;  Surgeon: Angelia Mould, MD;  Location: [place];  Service: Vascular;  Laterality: Left;   REVISON OF ARTERIOVENOUS FISTULA Left 01/10/2021   Procedure: CONVERSION TO BRACHIOCEPHALIC ARTERIOVENOUS FISTULA;  Surgeon: Serafina Mitchell, MD;  Location[facility]  OR;  Service: Vascular;  Laterality: Left;   RINOPLASTY       Home Medications:  Prior to Admission medications   Medication Sig Start Date End Date Taking? Authorizing Provider  acetaminophen (TYLENOL) 500 MG tablet Take 1,000 mg by mouth every 6 (six) hours as needed for mild pain, moderate pain or headache.   Yes [provider]  albuterol (VENTOLIN HFA) 108 (90 Base) MCG/ACT inhaler Inhale 2 puffs into the lungs 2 (two) times daily as needed for shortness of breath or wheezing. 06/01/21  Yes Oswald Hillock, MD  amLODipine (NORVASC) 10 MG tablet Take 1 tablet (10 mg total) by mouth daily. Patient taking differently: Take 10 mg by mouth at bedtime. 06/01/21  Yes Oswald Hillock, MD  amoxicillin-clavulanate (AUGMENTIN) 500-125 MG tablet Take 1 tablet (500 mg total) by mouth every evening for 3 days. 07/05/21 07/08/21 Yes Antonieta Pert, MD  apixaban (ELIQUIS) 2.5 MG TABS tablet Take 1 tablet (2.5 mg total) by mouth 2 (two) times daily. 07/05/21  08/04/21 Yes Kc, Maren Beach, MD  BREO ELLIPTA 100-25 MCG/INH AEPB Inhale 1 puff into the lungs daily. Patient taking differently: Inhale 1 puff into the lungs daily as needed (shortness of breath). 06/01/21 07/31/21 Yes Oswald Hillock, MD  cinacalcet (SENSIPAR) 60 MG tablet Take 1 tablet (60 mg total) by mouth every evening. 06/01/21  Yes Oswald Hillock, MD  ferric citrate (AURYXIA) 1 GM 210 MG(Fe) tablet Take 210 mg by mouth See admin instructions. Take one tablet (210 mg) by mouth occasionally   Yes [provider]  furosemide (LASIX) 80 MG tablet Take 1 tablet (80 mg total) by mouth daily as needed for fluid (Use only on non-HD days (Tues/Thur/Sat/Sun)). Patient taking differently: Take 80 mg by mouth every morning. 06/01/21  Yes Oswald Hillock, MD  metoprolol tartrate (LOPRESSOR) 50 MG tablet Take 1 tablet (50 mg total) by mouth 2 (two) times daily. 06/01/21  Yes Oswald Hillock, MD  midodrine (PROAMATINE) 10 MG tablet Take 1 tablet (10 mg total) by mouth 3 (three) times daily with meals. Patient taking differently: Take 10 mg by mouth 2 (two) times daily. 07/05/21 08/04/21 Yes Antonieta Pert, MD  multivitamin (RENA-VIT) TABS tablet Take 1 tablet by mouth at bedtime. 01/12/18  Yes [provider]  sevelamer carbonate (RENVELA) 800 MG tablet Take 800-1,600 mg by mouth See admin instructions. Take 1-2 tablets (5208569798 mg) by mouth three times daily with meals, take 1 tablet (800 mg) with snacks 05/31/21  Yes [provider]  hydrALAZINE (APRESOLINE) 25 MG tablet Take 2 tablets (50 mg total) by mouth 3 (three) times daily. Patient not taking: No sig reported 06/01/21   Oswald Hillock, MD    Inpatient Medications: Scheduled Meds:  amLODipine  10 mg Oral QHS   Chlorhexidine Gluconate Cloth  6 each Topical Q0600   cinacalcet  60 mg Oral QPM   fluticasone furoate-vilanterol  1 puff Inhalation Daily   [START ON 07/09/2021] heparin  2,000 Units Dialysis Once in dialysis   hydrALAZINE  50 mg Oral  TID   metoprolol tartrate  50 mg Oral BID   multivitamin  1 tablet Oral QHS   sevelamer carbonate  800-1,600 mg Oral TID with meals   Continuous Infusions:  cefTRIAXone (ROCEPHIN)  IV 1 g (07/08/21 0338)   doxycycline (VIBRAMYCIN) IV     PRN Meds: acetaminophen, albuterol, ferric citrate, morphine injection, naLOXone (NARCAN)  injection, sevelamer carbonate  Allergies:    Allergies  Allergen  Reactions   Vancomycin Shortness Of Breath and Itching    Social History:   Social History   Socioeconomic History   Marital status: Single    Spouse name: Not on file   Number of children: Not on file   Years of education: Not on file   Highest education level: Not on file  Occupational History   Not on file  Tobacco Use   Smoking status: Never   Smokeless tobacco: Never  Vaping Use   Vaping Use: Never used  Substance and Sexual Activity   Alcohol use: Not Currently    Alcohol/week: 0.0 standard drinks    Comment: has a drink once in a while- 12/30/19   Drug use: No   Sexual activity: Not Currently  Other Topics Concern   Not on file  Social History Narrative   Lives with a roommate in a one story home.  Has 1 child.  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  Education: high school.   Social Determinants of Health   Financial Resource Strain: Not on file  Food Insecurity: Not on file  Transportation Needs: Not on file  Physical Activity: Not on file  Stress: Not on file  Social Connections: Not on file  Intimate Partner Violence: Not on file    Family History:   Family History  Problem Relation Age of Onset   Cancer Mother      ROS:  Please see the history of present illness.  All other ROS reviewed and negative.     Physical Exam/Data:   Vitals:   07/08/21 0400 07/08/21 0430 07/08/21 0500 07/08/21 1004  BP: 133/89 (!) 133/99 126/90 113/86  Pulse: (!) 121 (!) 122 (!) 122   Resp: 13 13 (!) 27   Temp:   97.6 F (36.4 C) (!) 97.4 F (36.3 C)  TempSrc:   Oral    SpO2: 100% 100% 98% 98%  Weight:   58.9 kg   Height:   5\' 8"  (1.727 m)     Intake/Output Summary (Last 24 hours) at 07/08/2021 1036 Last data filed at 07/08/2021 0854 Gross per 24 hour  Intake 357 ml  Output --  Net 357 ml   Last 3 Weights 07/08/2021 07/07/2021 07/07/2021  Weight (lbs) 129 lb 13.6 oz 135 lb 135 lb  Weight (kg) 58.9 kg 61.236 kg 61.236 kg     Body mass index is 19.74 kg/m.  General: Slender, chronically ill appearing male, in no acute distress HEENT: normal Neck: Mild JVD Vascular: No carotid bruits; Distal pulses 2+ bilaterally Cardiac:  normal S1, S2; generally regular rate and rhythm, 2/6 SEM Lungs: Rales bases bilaterally, no wheezing, rhonchi or rales  Abd: soft, nontender, no hepatomegaly  Ext: Trace lower extremity edema Musculoskeletal:  No deformities, BUE and BLE strength weak but equal Skin: warm and dry  Neuro:  CNs 2-12 intact, no focal abnormalities noted Psych:  Normal affect   EKG:  The EKG was personally reviewed and demonstrates:  Atrial flutter , HR 123 (was in SR 09/30, Atrial flutter RVR 06/29/2021) Telemetry:  Telemetry was personally reviewed and demonstrates:  ATRIAL flutter, RVR  Relevant CV Studies:  ECHO: 07/02/2021  1. Strain attempted but patient asked to stop. Would recomm limited echo  with strain only.   2. LV cavitiy is small. Turburlent flow through LV. Near caviity  obliteration. . Left ventricular ejection fraction, by estimation, is 70  to 75%. The left ventricle has hyperdynamic function. The left ventricle  has  no regional wall motion abnormalities.  There is severe concentric left ventricular hypertrophy. Left ventricular diastolic function could not be evaluated.   3. Right ventricular systolic function is moderately reduced. The right  ventricular size is mildly enlarged. Mildly increased right ventricular  wall thickness.   4. Left atrial size was severely dilated.   5. Right atrial size was severely dilated.   6.  Mild mitral valve regurgitation. Moderate to severe mitral annular  calcification.   7. Tricuspid valve regurgitation is moderate.   8. AV is thickened with restricted motion. Peak and mean gradients  through the valve are 22 and 12 mm hg respectively. Dimensionless gradient is 0.53 consistent with mild AS, 2 D imaging suggests that it is moderate. No significant change in gradient from May 2022. Aortic valve regurgitation is mild.   9. Aortic dilatation noted. There is mild dilatation of the ascending  aorta, measuring 40 mm.   CARDIAC CT: 06/14/2018 Aorta: Aneurysmal dilatation of the ascending aorta with maximum diameter 43 mm, no calcifications and no dissection.   Aortic Valve:  Trileaflet.  No calcifications.   Coronary Arteries:  Normal coronary origin.  Right dominance.   RCA is a large dominant artery that gives rise to PDA and PLVB. There is minimal plaque with diffuse stenosis 0-25%.   Left main is a large artery that gives rise to LAD and LCX arteries. Left main has no plaque.   LAD is a large vessel that gives rise to two diagonal arteries and has minimal diffuse plaque with stenosis 0-25%. 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   LCX is a large non-dominant artery that gives rise to two large OM branches. There is mild calcified plaque in the proximal LCX artery with associated stenosis 25-50%.   OM1 and OM2 have minimal plaque.   Other findings:   Normal pulmonary vein drainage into the left atrium.   Normal let atrial appendage without a thrombus.   Dilated pulmonary artery measuring 35 mm suggestive of pulmonary hypertension.   IMPRESSION: 1. Coronary calcium score of 39. This was 19 percentile for age and sex matched control.  2. Normal coronary origin with right dominance.  3. Mild non-obstructive CAD. 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  4. Aneurysmal dilatation of the ascending aorta with  maximum diameter 43 mm, no calcifications and no dissection.  5. Dilated pulmonary artery measuring 35 mm suggestive of pulmonary hypertension.  Laboratory Data:  High Sensitivity Troponin:   Recent Labs  Lab 06/29/21 2237 06/30/21 0352 07/01/21 1329 07/02/21 0420  TROPONINIHS 62* 67* 144* 202*     Chemistry Recent Labs  Lab 07/02/21 0420 07/02/21 1600 07/05/21 0614 07/07/21 2025 07/08/21 0608  NA 129*   < > 132* 132* 134*  K 4.7   < > 3.9 5.1 3.8  CL 93*   < > 96* 96* 98  CO2 17*   < > 22 20* 18*  GLUCOSE 119*   < > 104* 101* 90  BUN 62*   < > 32* 58* 56*  CREATININE 7.90*   < > 4.97* 7.79* 8.29*  CALCIUM 8.0*   < > 8.7* 8.7* 8.7*  MG 2.6*  --   --   --   --   GFRNONAA 7*   < > 12* 7* 7*  ANIONGAP 19*   < > 14 16* 18*   < > = values in this interval not displayed.    Recent  Labs  Lab 07/04/21 0249 07/05/21 0614 07/07/21 2025  PROT 5.5* 5.9* 6.8  ALBUMIN 2.2* 2.4* 3.1*  AST 738* 309* 102*  ALT 1,130* 888* 496*  ALKPHOS 85 118 159*  BILITOT 1.2 1.2 1.5*   Lipids No results for input(s): CHOL, TRIG, HDL, LABVLDL, LDLCALC, CHOLHDL in the last 168 hours.  Hematology Recent Labs  Lab 07/04/21 0249 07/07/21 2025 07/08/21 0608  WBC 9.6 7.2 8.7  RBC 3.01* 2.96* 2.86*  HGB 9.9* 10.0* 9.3*  HCT 29.6* 31.0* 28.8*  MCV 98.3 104.7* 100.7*  MCH 32.9 33.8 32.5  MCHC 33.4 32.3 32.3  RDW 17.2* 18.8* 19.0*  PLT 125* 123* 105*   Thyroid  Recent Labs  Lab 07/04/21 1841 07/04/21 1941  TSH 6.693*  --   FREET4  --  0.92    TSH  Date Value Ref Range Status  07/04/2021 6.693 (H) 0.350 - 4.500 uIU/mL Final    Comment:    Performed by a 3rd Generation assay with a functional sensitivity of <=0.01 uIU/mL. Performed at [facility], [address] [place], [place] [address]   02/16/2018 1.995 0.350 - 4.500 uIU/mL Final    Comment:    Performed by a 3rd Generation assay with a functional sensitivity of <=0.01 uIU/mL. Performed at [facility],  [place] [address], [place], [place] [address]   04/07/2016 3.28 0.35 - 4.50 uIU/mL Final    BNPNo results for input(s): BNP, PROBNP in the last 168 hours.  DDimer No results for input(s): DDIMER in the last 168 hours.   Radiology/Studies:  DG Chest Port 1 View  Result Date: 07/07/2021 CLINICAL DATA:  Altered mental status x2 days, questionable sepsis. EXAM: PORTABLE CHEST 1 VIEW COMPARISON:  Chest radiograph July 02, 2021 FINDINGS: Dual lumen right chest wall dialysis catheter with tip overlying the superior cavoatrial junction. The heart size and mediastinal contours are stable. Similar size of the small right pleural effusion. Interval increased aeration of the right lower lobe of a decrease in the right basilar consolidation. No new focal consolidation. The visualized skeletal structures are unchanged. IMPRESSION: 1. Similar size of the small right pleural effusion with interval improved aeration of the right lower lobe and decreased in the right basilar consolidation. No new focal consolidation. Electronically Signed   By: Dahlia Bailiff M.D.   On: 07/07/2021 20:45   US Abdomen Limited RUQ (LIVER/GB)  Result Date: 07/04/2021 CLINICAL DATA:  Transaminitis. EXAM: ULTRASOUND ABDOMEN LIMITED RIGHT UPPER QUADRANT COMPARISON:  April 09, 2015 FINDINGS: Gallbladder: A moderate to marked amount of heterogeneous echogenic sludge is noted within the gallbladder lumen. No gallstones are identified. The gallbladder wall measures approximately 3.7 mm in thickness. No sonographic Murphy sign noted by sonographer. Common bile duct: Diameter: 2.4 mm Liver: No focal lesion identified. Diffusely increased echogenicity of the liver parenchyma is noted. Portal vein is patent on color Doppler imaging with normal direction of blood flow towards the liver. Other: A trace amount of right upper quadrant ascites is seen. Of incidental note is the presence of a right-sided pleural effusion. IMPRESSION: 1. Gallbladder sludge  without evidence of cholelithiasis. 2. Hepatic steatosis. 3. Trace amount of right upper quadrant ascites. 4. Right pleural effusion. Electronically Signed   By: Virgina Norfolk M.D.   On: 07/04/2021 19:54     Assessment and Plan:   Atrial flutter, RVR -The cycle length is longer than usual, he has 2:1 conduction with a heart rate in the 120s. - At his 11/04 DC,  he was on metoprolol 50 mg twice daily and midodrine 10 mg prescribed 3 times daily but he was only taking it twice daily - At this time, he is not on the midodrine and is on the metoprolol 50 mg twice daily. - However, I am concerned that hypotension was the cause of his weakness, and his blood pressure will not tolerate metoprolol 50 mg. - Hold it until MD sees. -Recent TSH was elevated, but free T4 was normal -I explained that in order to get him out of atrial flutter, he has to take an anticoagulant - He does not want to go back on Eliquis - The Eliquis was held on admission and because of the bleeding, he is not currently on a blood thinner  2.  Hypertension: - Home blood pressure meds include amlodipine 10 mg nightly -Lasix 80 mg as needed on nondialysis days but he was taking it daily - Metoprolol 50 mg twice daily - Hydralazine prescribed 25 mg x 2 tablets 3 times daily but patient was not taking it.  It is ordered here, the nurse is holding it -Discuss med changes with MD  3.  ESRD on HD - For dialysis sometime today per his schedule  4.  Rectal bleeding: -Eval per IM  Risk Assessment/Risk Scores:        CHA2DS2-VASc Score = 5   This indicates a 7.2% annual risk of stroke. The patient's score is based upon: CHF History: 1 HTN History: 1 Diabetes History: 0 Stroke History: 2 Vascular Disease History: 1 Age Score: 0 Gender Score: 0    For questions or updates, please contact [facility] Please consult [email or web address] for contact info under    Signed, Rosaria Ferries, PA-C  07/08/2021 10:36 AM

## 2021-07-08 NOTE — Consult Note (Addendum)
Attending physician's note   I have taken an interval history, reviewed the chart and discussed on rounds. I agree with the Advanced Practitioner's note, impression, and recommendations as outlined.   60 year old male with medical history as outlined below, readmitted with AMS, acute on chronic respiratory failure, after recent hospitalization from 10/29-11/4 for respiratory failure, pleural effusion, pneumonia, sepsis (then left AMA).  GI service was consulted for BRBPR.  Patient was started on Eliquis during last hospitalization for new onset atrial flutter, and only took 1 dose after hospital discharge.  Does report prior rectal symptoms that he is attributed to hemorrhoids.  Otherwise formed stools with BRB in commode and on stools.  1) Hematochezia 2) Anemia of chronic disease - Continue serial H/H checks with blood products as needed per protocol - H/H currently at baseline - Received Aranesp on 11/3 per Nephrology notes - Will eventually plan for colonoscopy for diagnostic and potentially therapeutic intent when able to tolerate bowel prep  3) Nausea/Vomiting - Multiple potential etiologies - Start antiemetics - KUB to evaluate for SBO - Hold on starting bowel prep until nausea/vomiting improves  4) Acute on chronic respiratory failure - PFTs show asthma with fixed severe obstruction - Elevated periprocedural risks.  Will require Anesthesia consultation for any sedation - Continued evaluation/treatment per primary Hospitalist service  5) Atrial flutter - Holding anticoagulation for now - Cardiology service consulted and following  6) ESRD - HD per Nephrology service  [address], [place], [place] [phone] office                                                                                  [facility] Consult: 1:59 PM 07/08/2021  LOS: 1 day    Referring  Provider: Dr Olevia Bowens  Primary Care Physician:  [name], [facility] in [place]. Primary Gastroenterologist:  unassigned    Reason for Consultation:  bleeding PR.     HPI: Thomas Robinson is a 60 y.o. male.  Patient has a multitude of medical issues, see below.  End-stage renal disease on hemodialysis.  Diastolic heart failure, profound RV failure by echo 07/02/2021.  2017 CVA.  Prior cervical fusion and lumbar laminectomy.  Noncompliance with medical advice.  Thrombocytopenia dating back to at least 2018.  Anemia, macrocytic, Hgb as low as 7.5 in 01/2021  Noncontrast CTAP in October 2020.  4.5 cm ascending thoracic aneurysm.  Liver, gallbladder, pancreas, spleen, bile ducts unremarkable  Admission 10/29 -11/4 but left AMA.  Admission for acute on chronic hypoxic respiratory failure, pleural effusion, pneumonia, sepsis, new atrial flutter.  Underwent thoracentesis.  Rate control challenging due to hypotension requiring midodrine.  Was to start Eliquis. 07/04/2021 abdominal ultrasound with gallbladder sludge, hepatic steatosis, trace RUQ ascites, right pleural effusion.  Experienced acute liver injury from heart failure/sepsis, T bili 5.7.  Normal alk phos.  AST/ALT 2105/1405 which were all downtrending at the time he left the hospital.  Hep B  surf Ag negative,  Hep B surface Ab negative.  HCV Ab negative 09/2020.  Coagulopathy with INR 2.1.    Return to the ED via EMS for altered mental status. PFTs today show asthma and fixed severe  obstruction.  Reports pain in his rectum especially with sitting or contact of his rectum on a surface.  Began after spine surgery in August.  This has persisted.  He has not sought medical attention or self treated to this.  About 5 days ago started noticing rectal bleeding.  Stools themselves were brown, chocolate colored accompanied by bleeding.  Yesterday dripping blood into the commode.  Just this afternoon started vomiting Pea soup looking material  associated with new onset cough.  Denies abdominal pain.  Denies heartburn, indigestion.  + Nasal bleeding.  Does not drink alcohol.  Denies NSAIDs. Eliquis on hold, last and only dose was on 11/6, says he will never take it again.  No PPI or H2 blocker at home.  Hgb 9.3, 9.1 5 days ago.  Platelets 105 (99  on 11/2).  INR 1.6.   T bili 1.5.  Alk phos 159.  AST/ALT 102/496.        Past Medical History:  Diagnosis Date  . Anaphylactic shock, unspecified, sequela 06/10/2019  . Aortic stenosis    mild-moderate AS 12/2020 echo  . Arthritis   . Asthma, chronic, unspecified asthma severity, with acute exacerbation 10/23/2017  . CAP (community acquired pneumonia) 10/23/2017  . Carpal tunnel syndrome of right wrist 10/05/2018  . Cataract    right - removed by surgery  . Cerebral infarction due to thrombosis of cerebral artery ([place])   . Cervical disc herniation 01/04/2020  . Chronic diastolic heart failure ([place]) 04/13/2018  . Chronic low back pain 11/24/2019  . Constipation   . COPD (chronic obstructive pulmonary disease) ([place])   . Cough    chronic cough  . Dyspnea   . Encounter for immunization 07/08/2017  . ESRD on hemodialysis ([place]) 02/16/2018  . Fall 06/04/2019  . GERD (gastroesophageal reflux disease) 06/04/2019  . GI bleeding 05/24/2017  . Gram-negative sepsis, unspecified ([place]) 09/05/2017  . History of fusion of cervical spine 03/26/2020  . Hyperlipidemia   . Hypertension   . Hypokalemia 06/04/2017  . Iron deficiency anemia, unspecified 09/09/2017  . Left shoulder pain 11/11/2019  . Lumbar radiculopathy 11/24/2019  . Lung contusion 06/04/2019  . LVH (left ventricular hypertrophy) due to hypertensive disease, with heart failure ([place]) 06/18/2020  . Macrocytic anemia 05/24/2017  . Moderate protein-calorie malnutrition ([place]) 06/06/2017  . Myofascial pain syndrome 06/12/2020  . Neuritis of right ulnar nerve 09/13/2018  . Non-compliance with renal dialysis ([place]) 02/08/2020  .  Oxygen deficiency 12/30/2019   O2 sats on RA 87% at PAT appt   . Pain in joint of right elbow 09/13/2018  . Pulmonary hypertension ([place])   . Renovascular hypertension 06/22/2020  . Restless leg syndrome   . Rib fractures 06/2019   Right  . S/P cardiac cath 08/27/2020   normal coronary arteries.  . Secondary hyperparathyroidism of renal origin ([place]) 06/04/2017  . Thoracic ascending aortic aneurysm    4.5 cm 06/04/19 CT  . Ulnar neuropathy 10/05/2018  . Volume overload 10/23/2017    Past Surgical History:  Procedure Laterality Date  . A/V FISTULAGRAM N/A 01/01/2021   Procedure: A/V FISTULAGRAM;  Surgeon: Serafina Mitchell, MD;  Location: [facility];  Service: Cardiovascular;  Laterality: N/A;  . A/V FISTULAGRAM N/A 01/15/2021   Procedure: A/V [id number];  Surgeon: Serafina Mitchell, MD;  Location: [facility];  Service: Cardiovascular;  Laterality: N/A;  . ANTERIOR CERVICAL DECOMP/DISCECTOMY FUSION N/A 01/04/2020   Procedure: ANTERIOR CERVICAL DECOMPRESSION/DISCECTOMY FUSION CERVICAL FIVE THROUGH SEVEN;  Surgeon:  Melina Schools, MD;  Location: [facility];  Service: Orthopedics;  Laterality: N/A;  3 hrs  . AV FISTULA PLACEMENT Left 05/28/2017   Procedure: LEFT ARM ARTERIOVENOUS (AV) FISTULA CREATION;  Surgeon: Conrad [place]ville, MD;  Location: [facility];  Service: Vascular;  Laterality: Left;  . AV FISTULA PLACEMENT Left 02/02/2021   Procedure: Debride left forearm, ligation of left upper arm Aretiovenous fistula;  Surgeon: Elam Dutch, MD;  Location: [facility] OR;  Service: Vascular;  Laterality: Left;  . AV FISTULA PLACEMENT Right 04/04/2021   Procedure: RIGHT ARTERIOVENOUS (AV) FISTULA CREATION;  Surgeon: Serafina Mitchell, MD;  Location: [facility];  Service: Vascular;  Laterality: Right;  . EYE SURGERY Right 06/02/2019   Cataract removed  . FRACTURE SURGERY    . INSERTION OF DIALYSIS CATHETER Right 02/02/2021   Procedure: INSERTION OF Right Internal jugular TUNNELED  DIALYSIS CATHETER;  Surgeon:  Elam Dutch, MD;  Location: [facility] OR;  Service: Vascular;  Laterality: Right;  . IR DIALY SHUNT INTRO [place] W/IMG LEFT Left 09/12/2020  . IR FLUORO GUIDE CV LINE RIGHT  05/25/2017  . IR US GUIDE VASC ACCESS LEFT  09/12/2020  . IR US GUIDE VASC ACCESS RIGHT  05/25/2017  . LIGATION OF ARTERIOVENOUS  FISTULA Left 01/10/2021   Procedure: LIGATION OF LEFT ARM RADIOCEPHALIC FISTULA;  Surgeon: Serafina Mitchell, MD;  Location: [facility];  Service: Vascular;  Laterality: Left;  . PERIPHERAL VASCULAR BALLOON ANGIOPLASTY Left 01/15/2021   Procedure: PERIPHERAL VASCULAR BALLOON ANGIOPLASTY;  Surgeon: Serafina Mitchell, MD;  Location: [facility];  Service: Cardiovascular;  Laterality: Left;  AVF  . REVISON OF ARTERIOVENOUS FISTULA Left 07/18/2019   Procedure: REVISION PLICATION OF RADIOCEPHALIC ARTERIOVENOUS FISTULA LEFT ARM;  Surgeon: Angelia Mould, MD;  Location: [facility];  Service: Vascular;  Laterality: Left;  . REVISON OF ARTERIOVENOUS FISTULA Left 01/10/2021   Procedure: CONVERSION TO BRACHIOCEPHALIC ARTERIOVENOUS FISTULA;  Surgeon: Serafina Mitchell, MD;  Location: [facility];  Service: Vascular;  Laterality: Left;  . RINOPLASTY      Prior to Admission medications   Medication Sig Start Date End Date Taking? Authorizing Provider  acetaminophen (TYLENOL) 500 MG tablet Take 1,000 mg by mouth every 6 (six) hours as needed for mild pain, moderate pain or headache.   Yes [provider]  albuterol (VENTOLIN HFA) 108 (90 Base) MCG/ACT inhaler Inhale 2 puffs into the lungs 2 (two) times daily as needed for shortness of breath or wheezing. 06/01/21  Yes Oswald Hillock, MD  amLODipine (NORVASC) 10 MG tablet Take 1 tablet (10 mg total) by mouth daily. Patient taking differently: Take 10 mg by mouth at bedtime. 06/01/21  Yes Oswald Hillock, MD  amoxicillin-clavulanate (AUGMENTIN) 500-125 MG tablet Take 1 tablet (500 mg total) by mouth every evening for 3 days. 07/05/21 07/08/21 Yes Antonieta Pert, MD  apixaban (ELIQUIS) 2.5 MG TABS tablet Take 1 tablet (2.5 mg total) by mouth 2 (two) times daily. 07/05/21 08/04/21 Yes Kc, Maren [place], MD  BREO ELLIPTA 100-25 MCG/INH AEPB Inhale 1 puff into the lungs daily. Patient taking differently: Inhale 1 puff into the lungs daily as needed (shortness of breath). 06/01/21 07/31/21 Yes Oswald Hillock, MD  cinacalcet (SENSIPAR) 60 MG tablet Take 1 tablet (60 mg total) by mouth every evening. 06/01/21  Yes Oswald Hillock, MD  ferric citrate (AURYXIA) 1 GM 210 MG(Fe) tablet Take 210 mg by mouth See admin instructions. Take one tablet (210 mg) by mouth occasionally  Yes [provider]  furosemide (LASIX) 80 MG tablet Take 1 tablet (80 mg total) by mouth daily as needed for fluid (Use only on non-HD days (Tues/Thur/Sat/Sun)). Patient taking differently: Take 80 mg by mouth every morning. 06/01/21  Yes Oswald Hillock, MD  metoprolol tartrate (LOPRESSOR) 50 MG tablet Take 1 tablet (50 mg total) by mouth 2 (two) times daily. 06/01/21  Yes Oswald Hillock, MD  midodrine (PROAMATINE) 10 MG tablet Take 1 tablet (10 mg total) by mouth 3 (three) times daily with meals. Patient taking differently: Take 10 mg by mouth 2 (two) times daily. 07/05/21 08/04/21 Yes Antonieta Pert, MD  multivitamin (RENA-VIT) TABS tablet Take 1 tablet by mouth at bedtime. 01/12/18  Yes [provider]  sevelamer carbonate (RENVELA) 800 MG tablet Take 800-1,600 mg by mouth See admin instructions. Take 1-2 tablets ([phone] mg) by mouth three times daily with meals, take 1 tablet (800 mg) with snacks 05/31/21  Yes [provider]  hydrALAZINE (APRESOLINE) 25 MG tablet Take 2 tablets (50 mg total) by mouth 3 (three) times daily. Patient not taking: No sig reported 06/01/21   Oswald Hillock, MD    Scheduled Meds: . Chlorhexidine Gluconate Cloth  6 each Topical Q0600  . cinacalcet  60 mg Oral QPM  . fluticasone furoate-vilanterol  1 puff Inhalation Daily  . [START ON 07/09/2021]  heparin  2,000 Units Dialysis Once in dialysis  . metoprolol tartrate  50 mg Oral BID  . multivitamin  1 tablet Oral QHS  . sevelamer carbonate  800-1,600 mg Oral TID with meals   Infusions: . sodium chloride    . sodium chloride    . cefTRIAXone (ROCEPHIN)  IV 1 g (07/08/21 0338)  . doxycycline (VIBRAMYCIN) IV     PRN Meds: sodium chloride, sodium chloride, acetaminophen, albuterol, alteplase, ferric citrate, heparin, hydrOXYzine, lidocaine (PF), lidocaine-prilocaine, morphine injection, naLOXone (NARCAN)  injection, pentafluoroprop-tetrafluoroeth, sevelamer carbonate   Allergies as of 07/07/2021 - Review Complete 07/07/2021  Allergen Reaction Noted  . Vancomycin Shortness Of Breath and Itching 04/15/2021    Family History  Problem Relation Age of Onset  . Cancer Mother     Social History   Socioeconomic History  . Marital status: Single    Spouse name: Not on file  . Number of children: Not on file  . Years of education: Not on file  . Highest education level: Not on file  Occupational History  . Not on file  Tobacco Use  . Smoking status: Never  . Smokeless tobacco: Never  Vaping Use  . Vaping Use: Never used  Substance and Sexual Activity  . Alcohol use: Not Currently    Alcohol/week: 0.0 standard drinks    Comment: has a drink once in a while- 12/30/19  . Drug use: No  . Sexual activity: Not Currently  Other Topics Concern  . Not on file  Social History Narrative   Lives with a roommate in a one story home.  Has 1 child.  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  Education: high school.   Social Determinants of Health   Financial Resource Strain: Not on file  Food Insecurity: Not on file  Transportation Needs: Not on file  Physical Activity: Not on file  Stress: Not on file  Social Connections: Not on file  Intimate Partner Violence: Not on file    REVIEW OF SYSTEMS: Constitutional: Weakness ENT:  No nose bleeds Pulm: Cough.  Shortness of breath. CV:   No  palpitations, no LE edema.  No angina GU:  No hematuria, no frequency GI: Denies dysphagia. Heme: Denies unusual or excessive bleeding or bruising. Transfusions: Previous PRBCs in 2018, 2021, February 2022. Neuro:  No headaches, no peripheral tingling or numbness Derm:  No itching, no rash or sores.  Endocrine:  No sweats or chills.  No polyuria or dysuria Immunization: Reviewed.  Notation made of hepatitis B vaccination on 8 separate occasions between March 2019 and October 2020 ???  Travel:  None beyond local counties in last few months.    PHYSICAL EXAM: Vital signs in last 24 hours: Vitals:   07/08/21 1120 07/08/21 1139  BP: 118/80   Pulse: (!) 117   Resp: 10   Temp:  97.8 F (36.6 C)  SpO2: 98%    Wt Readings from Last 3 Encounters:  07/08/21 58.9 kg  07/04/21 57.8 kg  05/31/21 59.4 kg    General: Chronically ill, cachectic, somewhat agitated.  Pale/ashen. Head: No signs of head trauma Eyes: No scleral icterus.  Conjunctiva pink. Ears: Not hard of hearing Nose: Picking his nose, blood on the tissue. Mouth: Oral mucosa is moist, pink, clear.  Tongue midline.  Compromised dentition. Neck: No JVD, no masses, no thyromegaly.  Hemodialysis access catheter on right Lungs: Difficult to auscultate any breath sounds but no adventitious sounds.  Persistent very congested cough. Heart: RRR with rate in the 1 teens. Abdomen: Not distended.  Soft.  Not tender.  Bowel sounds active.  No HSM, masses, bruits, hernias..   Rectal: Small, erythematous external hemorrhoid which is not thrombosed.  No active bleeding from this.  No tenderness with digital exam. Red blood on the exam finger, no stool palpable or visible on finger Musc/Skeltl: No joint redness or swelling.  Overall appearance of osteoporosis. Extremities: No CCE.  Limbs globally thin with muscle wasting.  Pulse but no thrill on AV fistula/graft on right upper extremity.  No palpable pulse or thrill on left upper extremity  where there was a graft. Neurologic: Alert.  Oriented x3.  Loquacious.  Moves all 4 limbs without tremor, strength not tested. Skin: No telangiectasia.  No rash, no sores. Nodes: No cervical adenopathy Psych: Agitated at times but mostly cooperative.  Able to provide good history.  Intake/Output from previous day: No intake/output data recorded. Intake/Output this shift: Total I/O In: 357 [P.O.:357] Out: -   LAB RESULTS: Recent Labs    07/07/21 2025 07/08/21 0608  WBC 7.2 8.7  HGB 10.0* 9.3*  HCT 31.0* 28.8*  PLT 123* 105*   BMET Lab Results  Component Value Date   NA 134 (L) 07/08/2021   NA 132 (L) 07/07/2021   NA 132 (L) 07/05/2021   K 3.8 07/08/2021   K 5.1 07/07/2021   K 3.9 07/05/2021   CL 98 07/08/2021   CL 96 (L) 07/07/2021   CL 96 (L) 07/05/2021   CO2 18 (L) 07/08/2021   CO2 20 (L) 07/07/2021   CO2 22 07/05/2021   GLUCOSE 90 07/08/2021   GLUCOSE 101 (H) 07/07/2021   GLUCOSE 104 (H) 07/05/2021   BUN 56 (H) 07/08/2021   BUN 58 (H) 07/07/2021   BUN 32 (H) 07/05/2021   CREATININE 8.29 (H) 07/08/2021   CREATININE 7.79 (H) 07/07/2021   CREATININE 4.97 (H) 07/05/2021   CALCIUM 8.7 (L) 07/08/2021   CALCIUM 8.7 (L) 07/07/2021   CALCIUM 8.7 (L) 07/05/2021   LFT Recent Labs    07/07/21 2025  PROT 6.8  ALBUMIN 3.1*  AST 102*  ALT 496*  ALKPHOS 159*  BILITOT 1.5*   PT/INR Lab Results  Component Value Date   INR 1.6 (H) 07/07/2021   INR 1.5 (H) 07/04/2021   INR 2.1 (H) 07/03/2021   Hepatitis Panel No results for input(s): HEPBSAG, HCVAB, HEPAIGM, HEPBIGM in the last 72 hours. C-Diff No components found for: CDIFF Lipase     Component Value Date/Time   LIPASE 44 07/01/2021 1329    Drugs of Abuse  No results found for: LABOPIA, COCAINSCRNUR, LABBENZ, AMPHETMU, THCU, LABBARB   RADIOLOGY STUDIES: DG Chest Port 1 View  Result Date: 07/07/2021 CLINICAL DATA:  Altered mental status x2 days, questionable sepsis. EXAM: PORTABLE CHEST 1 VIEW  COMPARISON:  Chest radiograph July 02, 2021 FINDINGS: Dual lumen right chest wall dialysis catheter with tip overlying the superior cavoatrial junction. The heart size and mediastinal contours are stable. Similar size of the small right pleural effusion. Interval increased aeration of the right lower lobe of a decrease in the right basilar consolidation. No new focal consolidation. The visualized skeletal structures are unchanged. IMPRESSION: 1. Similar size of the small right pleural effusion with interval improved aeration of the right lower lobe and decreased in the right basilar consolidation. No new focal consolidation. Electronically Signed   By: Dahlia Bailiff M.D.   On: 07/07/2021 20:45       IMPRESSION:      Bleeding per rectum.  Small hemorrhoid which is not actively bleeding seen on DRE.  Blood on exam glove.  No previous colonoscopy.  Has had rectal pain for a few months.  Acute onset of nonbloody emesis occurring within the last few hours.  Denies nausea.  Vomit looks like pea soup.  Patient denies previous issues with nausea, vomiting.  As observed, the vomiting coincides with deep, congested cough.  A flutter.  Left hospital AMA.  Did not fill prescription for Eliquis until yesterday when he took 1 dose.  Rectal bleeding preceded the Eliquis.     End-stage renal disease.  Monday Wednesday Friday HD.  Recent marked elevation transaminases and T bili.  These continued downward trend.  MDs during recent admission attributed this to heart failure/sepsis.  Abdominal ultrasound of 11/3 showed hepatic steatosis, trace right upper quadrant ascites, gallbladder sludge without cholecystitis, right pleural effusion.     AMS.  Brain imaging not repeated.  07/01/2021 CT head was unremarkable  Right pleural effusion, query pneumonia.  Received Maxipime.  Now on daily Rocephin for CAP.    PLAN:     Eventually ought to undergo colonoscopy but with his current vomiting, clearly not ready  to undergo bowel prep.  Adding topical cortisone for hemorrhoid.  Check KUB to rule out bowel obstruction as cause for current vomiting.   Azucena Freed  07/08/2021, 1:59 PM Phone [phone]

## 2021-07-08 NOTE — Progress Notes (Signed)
Dr. Justin Mend, neprhologist on call notified of patient bp and interventions. Telephone order given to terminate treatment.

## 2021-07-08 NOTE — Progress Notes (Signed)
Treatment terminated per telephone order from Dr. Justin Mend.

## 2021-07-09 ENCOUNTER — Inpatient Hospital Stay (HOSPITAL_COMMUNITY): Payer: Medicare Other

## 2021-07-09 LAB — HEPATITIS B SURFACE ANTIBODY, QUANTITATIVE: Hep B S AB Quant (Post): <3.1 m[IU]/mL — ABNORMAL LOW (ref >9.9)

## 2021-07-09 MED ORDER — PEG-KCL-NACL-NASULF-NA ASC-C 100 G PO SOLR: 1.0000 | Freq: Once | ORAL | Status: DC

## 2021-07-09 MED ORDER — PEG-KCL-NACL-NASULF-NA ASC-C 100 G PO SOLR
0.5000 | Freq: Once | ORAL | Status: DC
  Filled 2021-07-09: qty 1

## 2021-07-09 MED ORDER — METOCLOPRAMIDE HCL 5 MG/ML IJ SOLN
10.0000 mg | Freq: Four times a day (QID) | INTRAMUSCULAR | Status: AC
  Administered 2021-07-09: 10 mg via INTRAVENOUS
  Filled 2021-07-09 (×2): qty 2

## 2021-07-09 MED ORDER — BISACODYL 5 MG PO TBEC
20.0000 mg | DELAYED_RELEASE_TABLET | Freq: Once | ORAL | Status: AC
  Administered 2021-07-09: 20 mg via ORAL
  Filled 2021-07-09: qty 4

## 2021-07-09 MED ORDER — MIDODRINE HCL 5 MG PO TABS
10.0000 mg | ORAL_TABLET | ORAL | Status: DC
  Administered 2021-07-10: 10 mg via ORAL
  Filled 2021-07-09: qty 2

## 2021-07-09 MED ORDER — PEG-KCL-NACL-NASULF-NA ASC-C 100 G PO SOLR
0.5000 | Freq: Once | ORAL | Status: AC
  Administered 2021-07-09: 100 g via ORAL
  Filled 2021-07-09: qty 1

## 2021-07-09 NOTE — H&P (View-Only) (Signed)
Attending physician's note   I have taken an interval history, reviewed the chart and examined the patient. I agree with the Advanced Practitioner's note, impression, and recommendations as outlined.   The indications, risks, and benefits of EGD and colonoscopy were explained to the patient in detail. Risks include but are not limited to bleeding, perforation, adverse reaction to medications, and cardiopulmonary compromise. Sequelae include but are not limited to the possibility of surgery, hospitalization, and mortality. The patient verbalized understanding and wished to proceed. All questions answered. Further recommendations pending results of the exam.    Gerrit Heck, DO, FACG [phone] office               Daily Rounding Note  07/09/2021, 9:58 AM  LOS: 2 days   SUBJECTIVE:   Chief complaint:  blood per rectum.  Non-bloody N/V.  Anemia.        Patient's cough and vomiting resolved yesterday evening and he tolerated solid food for dinner and at breakfast today.  No bloody stools.  Feels okay.  Would like to get out of the hospital ASAP.  OBJECTIVE:         Vital signs in last 24 hours:    Temp:  [97.4 F (36.3 C)-97.9 F (36.6 C)] 97.4 F (36.3 C) (11/07 1959) Pulse Rate:  [117-122] 122 (11/07 2238) Resp:  [10-21] 18 (11/08 0851) BP: (79-130)/(51-86) 118/83 (11/08 0851) SpO2:  [97 %-100 %] 97 % (11/07 1959) Weight:  [58.5 kg-59.6 kg] 58.5 kg (11/07 1959) Last BM Date: 07/08/21 Filed Weights   07/08/21 0500 07/08/21 1637 07/08/21 1959  Weight: 58.9 kg 59.6 kg 58.5 kg   General: Thin, malnourished, chronically ill appearing.  Comfortable. Heart: Rate in 120s on telemetry monitor. Chest: Diminished breath sounds but clear.  No cough, no labored breathing. Abdomen:   Soft without distention.  Active bowel sounds.  No tenderness.  Extremities: 1+ pitting edema lower extremities. Neuro/Psych: Calmer than  yesterday.  Cooperative.  Fluid speech.  Moves all 4 limbs.  Intake/Output from previous day: 11/07 0701 - 11/08 0700 In: 357 [P.O.:357] Out: 975   Intake/Output this shift: Total I/O In: 220 [P.O.:220] Out: -   Lab Results: Recent Labs    07/07/21 2025 07/08/21 0608  WBC 7.2 8.7  HGB 10.0* 9.3*  HCT 31.0* 28.8*  PLT 123* 105*   BMET Recent Labs    07/07/21 2025 07/08/21 0608  NA 132* 134*  K 5.1 3.8  CL 96* 98  CO2 20* 18*  GLUCOSE 101* 90  BUN 58* 56*  CREATININE 7.79* 8.29*  CALCIUM 8.7* 8.7*   LFT Recent Labs    07/07/21 2025  PROT 6.8  ALBUMIN 3.1*  AST 102*  ALT 496*  ALKPHOS 159*  BILITOT 1.5*   PT/INR Recent Labs    07/07/21 2159  LABPROT 19.0*  INR 1.6*   Hepatitis Panel Recent Labs    07/08/21 0608  HEPBSAG NON REACTIVE    Studies/Results: NM Pulmonary Perfusion  Result Date: 07/08/2021 CLINICAL DATA:  Chest pain, shortness of breath, history of blood clots EXAM: NUCLEAR MEDICINE PERFUSION LUNG SCAN TECHNIQUE: Perfusion images were obtained in multiple projections after intravenous injection of radiopharmaceutical. Ventilation scans intentionally deferred if perfusion scan and chest x-ray adequate for interpretation during COVID 19 epidemic. RADIOPHARMACEUTICALS:  3.91 mCi Tc-76m MAA IV COMPARISON:  Chest radiograph dated 07/07/2021. CTA chest dated 06/30/2021. FINDINGS: Normal perfusion, without wedge-shaped perfusion defects to suggest pulmonary embolism. Corresponding chest radiograph demonstrates patchy opacities in  the right mid and lower lungs. IMPRESSION: Negative for pulmonary embolism. Electronically Signed   By: Julian Hy M.D.   On: 07/08/2021 18:23   DG Chest Port 1 View  Result Date: 07/07/2021 CLINICAL DATA:  Altered mental status x2 days, questionable sepsis. EXAM: PORTABLE CHEST 1 VIEW COMPARISON:  Chest radiograph July 02, 2021 FINDINGS: Dual lumen right chest wall dialysis catheter with tip overlying the  superior cavoatrial junction. The heart size and mediastinal contours are stable. Similar size of the small right pleural effusion. Interval increased aeration of the right lower lobe of a decrease in the right basilar consolidation. No new focal consolidation. The visualized skeletal structures are unchanged. IMPRESSION: 1. Similar size of the small right pleural effusion with interval improved aeration of the right lower lobe and decreased in the right basilar consolidation. No new focal consolidation. Electronically Signed   By: Dahlia Bailiff M.D.   On: 07/07/2021 20:45    Scheduled Meds: . Chlorhexidine Gluconate Cloth  6 each Topical Q0600  . cinacalcet  60 mg Oral QPM  . fluticasone furoate-vilanterol  1 puff Inhalation Daily  . hydrocortisone   Topical BID  . metoprolol tartrate  50 mg Oral BID  . [START ON 07/10/2021] midodrine  10 mg Oral Q M,W,F-HD  . multivitamin  1 tablet Oral QHS  . sevelamer carbonate  800-1,600 mg Oral TID with meals   Continuous Infusions: . cefTRIAXone (ROCEPHIN)  IV 1 g (07/09/21 0057)  . doxycycline (VIBRAMYCIN) IV 100 mg (07/09/21 0859)   PRN Meds:.acetaminophen, albuterol, ferric citrate, hydrOXYzine, morphine injection, naLOXone (NARCAN)  injection, sevelamer carbonate   ASSESMENT:   Hematochezia.     Chronic, macrocytic anemia.  Multifactorial, primarily anemia of chronic kidney disease.  Weekly Venofer, bi monthly Mirsers per renal.    Nausea, nonbloody emesis.  Resolved.  Patient refused portable KUB ordered yesterday.  Acute on chronic respiratory failure.  Cor pulmonale, severe pulmonary hypertension.  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  Cardiology attributes this to BP meds which patient adjusts to his perceived needs and may be taking more than prescribed.  Norvasc, hydralazine have been discontinued.  Atrial flutter.  Eliquis on hold.  Cardiology would like GI evaluation before restarting Eliquis      ESRD.   PLAN   Colonoscopy and EGD tomorrow, patient currently agreeable.  See orders for clear liquids, bowel prep.   With resolution of vomiting, canceled the portable abdomen.    Azucena Freed  07/09/2021, 9:58 AM Phone [phone]

## 2021-07-09 NOTE — Progress Notes (Addendum)
Attending physician's note   I have taken an interval history, reviewed the chart and examined the patient. I agree with the Advanced Practitioner's note, impression, and recommendations as outlined.   The indications, risks, and benefits of EGD and colonoscopy were explained to the patient in detail. Risks include but are not limited to bleeding, perforation, adverse reaction to medications, and cardiopulmonary compromise. Sequelae include but are not limited to the possibility of surgery, hospitalization, and mortality. The patient verbalized understanding and wished to proceed. All questions answered. Further recommendations pending results of the exam.    Gerrit Heck, DO, FACG [phone] office               Daily Rounding Note  07/09/2021, 9:58 AM  LOS: 2 days   SUBJECTIVE:   Chief complaint:  blood per rectum.  Non-bloody N/V.  Anemia.        Patient's cough and vomiting resolved yesterday evening and he tolerated solid food for dinner and at breakfast today.  No bloody stools.  Feels okay.  Would like to get out of the hospital ASAP.  OBJECTIVE:         Vital signs in last 24 hours:    Temp:  [97.4 F (36.3 C)-97.9 F (36.6 C)] 97.4 F (36.3 C) (11/07 1959) Pulse Rate:  [117-122] 122 (11/07 2238) Resp:  [10-21] 18 (11/08 0851) BP: (79-130)/(51-86) 118/83 (11/08 0851) SpO2:  [97 %-100 %] 97 % (11/07 1959) Weight:  [58.5 kg-59.6 kg] 58.5 kg (11/07 1959) Last BM Date: 07/08/21 Filed Weights   07/08/21 0500 07/08/21 1637 07/08/21 1959  Weight: 58.9 kg 59.6 kg 58.5 kg   General: Thin, malnourished, chronically ill appearing.  Comfortable. Heart: Rate in 120s on telemetry monitor. Chest: Diminished breath sounds but clear.  No cough, no labored breathing. Abdomen:   Soft without distention.  Active bowel sounds.  No tenderness.  Extremities: 1+ pitting edema lower extremities. Neuro/Psych: Calmer than  yesterday.  Cooperative.  Fluid speech.  Moves all 4 limbs.  Intake/Output from previous day: 11/07 0701 - 11/08 0700 In: 357 [P.O.:357] Out: 975   Intake/Output this shift: Total I/O In: 220 [P.O.:220] Out: -   Lab Results: Recent Labs    07/07/21 2025 07/08/21 0608  WBC 7.2 8.7  HGB 10.0* 9.3*  HCT 31.0* 28.8*  PLT 123* 105*   BMET Recent Labs    07/07/21 2025 07/08/21 0608  NA 132* 134*  K 5.1 3.8  CL 96* 98  CO2 20* 18*  GLUCOSE 101* 90  BUN 58* 56*  CREATININE 7.79* 8.29*  CALCIUM 8.7* 8.7*   LFT Recent Labs    07/07/21 2025  PROT 6.8  ALBUMIN 3.1*  AST 102*  ALT 496*  ALKPHOS 159*  BILITOT 1.5*   PT/INR Recent Labs    07/07/21 2159  LABPROT 19.0*  INR 1.6*   Hepatitis Panel Recent Labs    07/08/21 0608  HEPBSAG NON REACTIVE    Studies/Results: NM Pulmonary Perfusion  Result Date: 07/08/2021 CLINICAL DATA:  Chest pain, shortness of breath, history of blood clots EXAM: NUCLEAR MEDICINE PERFUSION LUNG SCAN TECHNIQUE: Perfusion images were obtained in multiple projections after intravenous injection of radiopharmaceutical. Ventilation scans intentionally deferred if perfusion scan and chest x-ray adequate for interpretation during COVID 19 epidemic. RADIOPHARMACEUTICALS:  3.91 mCi Tc-72m MAA IV COMPARISON:  Chest radiograph dated 07/07/2021. CTA chest dated 06/30/2021. FINDINGS: Normal perfusion, without wedge-shaped perfusion defects to suggest pulmonary embolism. Corresponding chest radiograph demonstrates patchy opacities in  the right mid and lower lungs. IMPRESSION: Negative for pulmonary embolism. Electronically Signed   By: Julian Hy M.D.   On: 07/08/2021 18:23   DG Chest Port 1 View  Result Date: 07/07/2021 CLINICAL DATA:  Altered mental status x2 days, questionable sepsis. EXAM: PORTABLE CHEST 1 VIEW COMPARISON:  Chest radiograph July 02, 2021 FINDINGS: Dual lumen right chest wall dialysis catheter with tip overlying the  superior cavoatrial junction. The heart size and mediastinal contours are stable. Similar size of the small right pleural effusion. Interval increased aeration of the right lower lobe of a decrease in the right basilar consolidation. No new focal consolidation. The visualized skeletal structures are unchanged. IMPRESSION: 1. Similar size of the small right pleural effusion with interval improved aeration of the right lower lobe and decreased in the right basilar consolidation. No new focal consolidation. Electronically Signed   By: Dahlia Bailiff M.D.   On: 07/07/2021 20:45    Scheduled Meds: . Chlorhexidine Gluconate Cloth  6 each Topical Q0600  . cinacalcet  60 mg Oral QPM  . fluticasone furoate-vilanterol  1 puff Inhalation Daily  . hydrocortisone   Topical BID  . metoprolol tartrate  50 mg Oral BID  . [START ON 07/10/2021] midodrine  10 mg Oral Q M,W,F-HD  . multivitamin  1 tablet Oral QHS  . sevelamer carbonate  800-1,600 mg Oral TID with meals   Continuous Infusions: . cefTRIAXone (ROCEPHIN)  IV 1 g (07/09/21 0057)  . doxycycline (VIBRAMYCIN) IV 100 mg (07/09/21 0859)   PRN Meds:.acetaminophen, albuterol, ferric citrate, hydrOXYzine, morphine injection, naLOXone (NARCAN)  injection, sevelamer carbonate   ASSESMENT:   Hematochezia.     Chronic, macrocytic anemia.  Multifactorial, primarily anemia of chronic kidney disease.  Weekly Venofer, bi monthly Mirsers per renal.    Nausea, nonbloody emesis.  Resolved.  Patient refused portable KUB ordered yesterday.  Acute on chronic respiratory failure.  Cor pulmonale, severe pulmonary hypertension.  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  Cardiology attributes this to BP meds which patient adjusts to his perceived needs and may be taking more than prescribed.  Norvasc, hydralazine have been discontinued.  Atrial flutter.  Eliquis on hold.  Cardiology would like GI evaluation before restarting Eliquis      ESRD.   PLAN   Colonoscopy and EGD tomorrow, patient currently agreeable.  See orders for clear liquids, bowel prep.   With resolution of vomiting, canceled the portable abdomen.    Azucena Freed  07/09/2021, 9:58 AM Phone [phone]

## 2021-07-09 NOTE — Progress Notes (Signed)
Patient says he is irritated because people keep coming in his room while he is trying to rest. He said he will leave AMA when his visitor gets here if he does no have any rest. Patient is refusing care from now until later in the morning. He states he will wait for the physicians to come around, other than that, he doesn't want anyone in his room until about 8-9am.

## 2021-07-09 NOTE — Progress Notes (Addendum)
Patient refusing to take nighttime oral medication. Pt soiled his bed, asked for help and then refused help. Pt educated about importance of bowel prep. Pt said he would drink it but has yet to have any despite reminders. MD notified about incomplete bowel prep. Bowel movements are not clear.

## 2021-07-09 NOTE — Progress Notes (Signed)
[facility] PROGRESS NOTE    Progress Note  Thomas Robinson  YHC:[id number] DOB: [birth year] DOA: 07/07/2021 PCP: [name], PA     Brief Narrative:   Thomas Robinson is an 60 y.o. male past medical history significant for chronic hypoxic respiratory failure on 2 L of oxygen, chronic diastolic heart failure end-stage renal disease on hemodialysis Monday Wednesday and Friday, she also has a history of a thoracic aortic aneurysm, atrial flutter status postconversion on Eliquis, essential hypertension who recently left [place] on 07/05/2021 during this admission he underwent thoracocentesis for right-sided pleural effusion which was transudative in the setting of pneumonia and septic shock after at the his house started developing bright red blood per rectum he stopped his Eliquis on his own started getting weak brought into the ED hypoxic, and atrial flutter and encephalopathy placed on 6 L of oxygen.  Hemoglobin of 10 (right about baseline).  FOBT positive, potassium 5, sodium 132 CO2 28 creatinine up to 7 (on the day he left AMA was 5) chest x-ray showed a small right-sided pleural effusion and resolving consolidation with started empirically on doxycycline and cefepime.    Assessment/Plan:   Acute on chronic respiratory failure with hypoxia of unclear etiology: Question right-sided pneumonia.  He did complete a 6-day course of antibiotics before he left AGAINST MEDICAL ADVICE. PFTs showed fixed severe obstruction. VQ scan was negative for chronic PEs. Continue doxycycline and cefepime. Patient is stable but continues to be significantly tachycardic.  Hematochezia /anemia of chronic disease: He was on Eliquis for atrial flutter which she stopped prior to coming to admission.  He relates bright red blood per rectum. FOBT was positive.  I was consulted recommended colonoscopy.  Continue to hold Eliquis.  Nausea and vomiting: Of unclear etiology cannot start  bowel prep due to this, abdominal x-ray is pending.   Typical Atrial flutter: Now back in atrial flutter cardiology was consulted during his last admission there was a discussion of about TEE cardioversion once he had recovered from his acute illness. Cardiology recommended TEE cardioversion when he is cleared from a GI standpoint due to his hematochezia.  High anion gap metabolic acidosis: Likely due to renal dysfunction. Continue to hold IV fluids this will be managed with HD  Chronic diastolic heart failure: Continue further management per HD.  Sick euthyroid syndrome: With initiation of 6.6 Free T4 unremarkable need to be rechecked in 6 weeks. Elevated LFTs: On his last admission it was greater than 2000 probably due to septic shock.  End-stage renal disease on hemodialysis. Will let nephrology know to continue hemodialysis Tuesdays Thursdays and Saturdays. Renal to dialyze him today.  Essential hypertension: Antihypertensive medication probably contributing to his nausea and vomiting cardiology agrees Norvasc and hydralazine were stopped.   Stage II sacral decubitus ulcer present on admission: RN Pressure Injury Documentation: Pressure Injury 07/01/21 Coccyx Mid Stage 2 -  Partial thickness loss of dermis presenting as a shallow open injury with a red, pink wound bed without slough. (Active)  07/01/21 1300  Location: Coccyx  Location Orientation: Mid  Staging: Stage 2 -  Partial thickness loss of dermis presenting as a shallow open injury with a red, pink wound bed without slough.  Wound Description (Comments):   Present on Admission: Yes     DVT prophylaxis: lovenox Family Communication:none Status is: Inpatient  Remains inpatient appropriate because: Acute severity of illness due to a flutter, volume overload        Code Status:  Code Status Orders  (From admission, onward)           Start     Ordered   07/07/21 2322  Full code  Continuous         07/07/21 2322           Code Status History     Date Active Date Inactive Code Status Order ID Comments User Context   06/30/2021 0411 07/05/2021 2033 Full Code [id number]  Renee Pain, MD ED   05/30/2021 2349 06/01/2021 2213 Full Code [id number]  Rise Patience, MD ED   05/18/2021 2128 05/19/2021 1831 Full Code [id number]  Shela Leff, MD ED   02/18/2021 0536 02/18/2021 1947 Full Code [id number]  Shalhoub, Sherryll Burger, MD ED   02/17/2021 1240 02/17/2021 2325 Full Code [id number]  Lequita Halt, MD ED   02/01/2021 1524 02/04/2021 1626 Full Code [id number]  Lacinda Axon, MD ED   01/12/2021 2044 01/15/2021 2035 Full Code [id number]  Etta Quill, DO ED   09/09/2020 2318 09/12/2020 1722 Full Code [id number]  Rise Patience, MD ED   09/04/2020 0757 09/05/2020 1413 Full Code [id number]  Edwin Dada, MD Inpatient   08/05/2020 1903 08/07/2020 1712 Full Code [id number]  Marcelyn Bruins, MD ED   07/03/2020 1431 07/03/2020 2356 Full Code [id number]  Cox, [place], DO Inpatient   02/08/2020 2101 02/09/2020 1458 Full Code [id number]  Etta Quill, DO ED   01/24/2020 0735 01/24/2020 2001 Full Code [id number]  Allie Dimmer, MD Inpatient   01/04/2020 1616 01/05/2020 1345 Full Code [id number]  Lequita Halt, MD Inpatient   01/04/2020 1616 01/04/2020 1616 Full Code [id number]  Melina Schools, MD Inpatient   06/04/2019 2059 06/07/2019 1118 Full Code [id number]  Ivor Costa, MD ED   05/16/2019 1702 05/18/2019 1422 Full Code [id number]  Kathrene Alu, MD ED   04/13/2018 2250 04/15/2018 1911 Full Code [id number]  Vianne Bulls, MD ED   02/16/2018 1626 02/17/2018 1520 Full Code [id number]  Welford Roche, MD ED   10/24/2017 0146 10/25/2017 0735 Full Code [id number]  Gwynne Edinger, MD ED   08/24/2017 0338 08/25/2017 0005 Full Code [id number]  Vilma Prader, MD Inpatient   05/24/2017 2140 06/02/2017 1225 Full Code [id number]  Vianne Bulls, MD ED   04/09/2015 2233 04/12/2015 2107 Full Code [id number]  Florencia Reasons, MD Inpatient         IV Access:   Peripheral IV   Procedures and diagnostic studies:   NM Pulmonary Perfusion  Result Date: 07/08/2021 CLINICAL DATA:  Chest pain, shortness of breath, history of blood clots EXAM: NUCLEAR MEDICINE PERFUSION LUNG SCAN TECHNIQUE: Perfusion images were obtained in multiple projections after intravenous injection of radiopharmaceutical. Ventilation scans intentionally deferred if perfusion scan and chest x-ray adequate for interpretation during COVID 19 epidemic. RADIOPHARMACEUTICALS:  3.91 mCi Tc-28m MAA IV COMPARISON:  Chest radiograph dated 07/07/2021. CTA chest dated 06/30/2021. FINDINGS: Normal perfusion, without wedge-shaped perfusion defects to suggest pulmonary embolism. Corresponding chest radiograph demonstrates patchy opacities in the right mid and lower lungs. IMPRESSION: Negative for pulmonary embolism. Electronically Signed   By: Julian Hy M.D.   On: 07/08/2021 18:23   DG Chest Port 1 View  Result Date: 07/07/2021 CLINICAL DATA:  Altered mental status x2 days, questionable sepsis. EXAM: PORTABLE CHEST 1 VIEW COMPARISON:  Chest radiograph July 02, 2021 FINDINGS: Dual lumen right chest wall dialysis catheter with tip overlying the superior cavoatrial junction. The heart size and mediastinal contours  are stable. Similar size of the small right pleural effusion. Interval increased aeration of the right lower lobe of a decrease in the right basilar consolidation. No new focal consolidation. The visualized skeletal structures are unchanged. IMPRESSION: 1. Similar size of the small right pleural effusion with interval improved aeration of the right lower lobe and decreased in the right basilar consolidation. No new focal consolidation. Electronically Signed   By: Dahlia Bailiff M.D.   On: 07/07/2021 20:45     Medical Consultants:   None.   Subjective:    [name] no complaints  Objective:    Vitals:   07/08/21 1941  07/08/21 1959 07/08/21 2238 07/09/21 0851  BP: (!) 79/53 130/85 (!) 118/59 118/83  Pulse: (!) 121  (!) 122   Resp:  (!) 21  18  Temp:  (!) 97.4 F (36.3 C)    TempSrc:  Oral    SpO2: 100% 97%    Weight:  58.5 kg    Height:       SpO2: 97 % O2 Flow Rate (L/min): 4 L/min   Intake/Output Summary (Last 24 hours) at 07/09/2021 1013 Last data filed at 07/09/2021 0900 Gross per 24 hour  Intake 220 ml  Output 975 ml  Net -755 ml   Filed Weights   07/08/21 0500 07/08/21 1637 07/08/21 1959  Weight: 58.9 kg 59.6 kg 58.5 kg    Exam: General exam: In no acute distress. Respiratory system: Good air movement and clear to auscultation. Cardiovascular system: S1 & S2 heard, RRR. No JVD. Gastrointestinal system: Abdomen is nondistended, soft and nontender.  Extremities: No pedal edema. Skin: No rashes, lesions or ulcers Psychiatry: He understands to treatment and his condition especially the treatment that is being proposed, but that his judgment and insight on his medical condition about leaving [place] make it seem like he has a poor judgment   Data Reviewed:    Labs: Basic Metabolic Panel: Recent Labs  Lab 07/02/21 1600 07/03/21 0409 07/03/21 1331 07/04/21 0249 07/05/21 0614 07/07/21 2025 07/08/21 0608  NA 131*   < > 131* 133* 132* 132* 134*  K 4.8   < > 5.4* 5.5* 3.9 5.1 3.8  CL 97*   < > 98 99 96* 96* 98  CO2 21*   < > 19* 19* 22 20* 18*  GLUCOSE 158*   < > 147* 123* 104* 101* 90  BUN 39*   < > 51* 58* 32* 58* 56*  CREATININE 5.52*   < > 6.78* 7.22* 4.97* 7.79* 8.29*  CALCIUM 8.3*   < > 8.3* 8.5* 8.7* 8.7* 8.7*  PHOS 5.3*  --   --   --   --   --   --    < > = values in this interval not displayed.    GFR Estimated Creatinine Clearance: 7.6 mL/min (A) (by C-G formula based on SCr of 8.29 mg/dL (H)). Liver Function Tests: Recent Labs  Lab 07/03/21 0409 07/03/21 1331 07/04/21 0249 07/05/21 0614 07/07/21 2025  AST 2,105* 1,389* 738* 309* 102*  ALT  1,405* 1,296* 1,130* 888* 496*  ALKPHOS 78 79 85 118 159*  BILITOT 1.1 1.2 1.2 1.2 1.5*  PROT 5.7* 5.4* 5.5* 5.9* 6.8  ALBUMIN 2.3* 2.2* 2.2* 2.4* 3.1*    No results for input(s): LIPASE, AMYLASE in the last 168 hours.  No results for input(s): AMMONIA in the last 168 hours. Coagulation profile Recent Labs  Lab 07/03/21 0412 07/04/21 1841 07/07/21 2159  INR  2.1* 1.5* 1.6*    COVID-19 Labs  No results for input(s): DDIMER, FERRITIN, LDH, CRP in the last 72 hours.  Lab Results  Component Value Date   SARSCOV2NAA NEGATIVE 07/07/2021   [place] NEGATIVE 06/29/2021   [place] NEGATIVE 05/30/2021   [place] NEGATIVE 05/18/2021    CBC: Recent Labs  Lab 07/03/21 0412 07/04/21 0249 07/07/21 2025 07/08/21 0608  WBC 7.4 9.6 7.2 8.7  HGB 9.1* 9.9* 10.0* 9.3*  HCT 28.9* 29.6* 31.0* 28.8*  MCV 101.8* 98.3 104.7* 100.7*  PLT 99* 125* 123* 105*    Cardiac Enzymes: Recent Labs  Lab 07/03/21 1031  CKTOTAL 30*    BNP (last 3 results) No results for input(s): PROBNP in the last 8760 hours. CBG: Recent Labs  Lab 07/04/21 2336 07/05/21 0423 07/05/21 0814 07/05/21 1209 07/08/21 2345  GLUCAP 103* 124* 84 97 109*    D-Dimer: No results for input(s): DDIMER in the last 72 hours. Hgb A1c: No results for input(s): HGBA1C in the last 72 hours. Lipid Profile: No results for input(s): CHOL, HDL, LDLCALC, TRIG, CHOLHDL, LDLDIRECT in the last 72 hours. Thyroid function studies: No results for input(s): TSH, T4TOTAL, T3FREE, THYROIDAB in the last 72 hours.  Invalid input(s): FREET3 Anemia work up: No results for input(s): VITAMINB12, FOLATE, FERRITIN, TIBC, IRON, RETICCTPCT in the last 72 hours. Sepsis Labs: Recent Labs  Lab 07/03/21 0412 07/04/21 0249 07/07/21 2025 07/07/21 2109 07/08/21 0608  WBC 7.4 9.6 7.2  --  8.7  LATICACIDVEN  --   --   --  1.5 1.2    Microbiology Recent Results (from the past 240 hour(s))  Resp Panel by RT-PCR (Flu A&B, Covid)  Nasopharyngeal Swab     Status: None   Collection Time: 06/29/21  9:56 PM   Specimen: Nasopharyngeal Swab; Nasopharyngeal(NP) swabs in vial transport medium  Result Value Ref Range Status   SARS Coronavirus 2 by RT PCR NEGATIVE NEGATIVE Final    Comment: (NOTE) SARS-CoV-2 target nucleic acids are NOT DETECTED.  The SARS-CoV-2 RNA is generally detectable in upper respiratory specimens during the acute phase of infection. The lowest concentration of SARS-CoV-2 viral copies this assay can detect is 138 copies/mL. A negative result does not preclude SARS-Cov-2 infection and should not be used as the sole basis for treatment or other patient management decisions. A negative result may occur with  improper specimen collection/handling, submission of specimen other than nasopharyngeal swab, presence of viral mutation(s) within the areas targeted by this assay, and inadequate number of viral copies(<138 copies/mL). A negative result must be combined with clinical observations, patient history, and epidemiological information. The expected result is Negative.  Fact Sheet for Patients:  EntrepreneurPulse.com.au  Fact Sheet for Healthcare Providers:  IncredibleEmployment.be  This test is no t yet approved or cleared by the [place] FDA and  has been authorized for detection and/or diagnosis of SARS-CoV-2 by FDA under an Emergency Use Authorization (EUA). This EUA will remain  in effect (meaning this test can be used) for the duration of the COVID-19 declaration under Section 564(b)(1) of the Act, 21 U.S.C.section 360bbb-3(b)(1), unless the authorization is terminated  or revoked sooner.       Influenza A by PCR NEGATIVE NEGATIVE Final   Influenza B by PCR NEGATIVE NEGATIVE Final    Comment: (NOTE) The Xpert Xpress SARS-CoV-2/FLU/RSV plus assay is intended as an aid in the diagnosis of influenza from Nasopharyngeal swab specimens and should not be  used as a sole basis for treatment. Nasal washings  and aspirates are unacceptable for Xpert Xpress SARS-CoV-2/FLU/RSV testing.  Fact Sheet for Patients: EntrepreneurPulse.com.au  Fact Sheet for Healthcare Providers: IncredibleEmployment.be  This test is not yet approved or cleared by the [place] FDA and has been authorized for detection and/or diagnosis of SARS-CoV-2 by FDA under an Emergency Use Authorization (EUA). This EUA will remain in effect (meaning this test can be used) for the duration of the COVID-19 declaration under Section 564(b)(1) of the Act, 21 U.S.C. section 360bbb-3(b)(1), unless the authorization is terminated or revoked.  Performed at [facility], [place] [address], [place], [place] [id number]   Blood culture (routine x 2)     Status: None   Collection Time: 06/30/21  3:52 AM   Specimen: BLOOD  Result Value Ref Range Status   Specimen Description   Final    BLOOD BLOOD LEFT FOREARM Performed at [facility] [address] [place], [place] [address]    Special Requests   Final    BOTTLES DRAWN AEROBIC AND ANAEROBIC Blood Culture adequate volume Performed at [facility] [address], [place], [place] [address]    Culture   Final    NO GROWTH 5 DAYS Performed at [facility], [place] [address], [place], [place] [address]    Report Status 07/05/2021 FINAL  Final  MRSA Next Gen by PCR, Nasal     Status: None   Collection Time: 06/30/21  5:17 AM   Specimen: Nasal Mucosa; Nasal Swab  Result Value Ref Range Status   MRSA by PCR Next Gen NOT DETECTED NOT DETECTED Final    Comment: (NOTE) The GeneXpert MRSA Assay (FDA approved for NASAL specimens only), is one component of a comprehensive MRSA colonization surveillance program. It is not intended to diagnose MRSA infection nor to guide or monitor treatment for MRSA infections. Test performance is not FDA  approved in patients less than 42 years old. Performed at [facility], [place] [address] [place], [place] [address]   Blood culture (routine x 2)     Status: None   Collection Time: 06/30/21  6:58 AM   Specimen: BLOOD  Result Value Ref Range Status   Specimen Description   Final    BLOOD BLOOD LEFT HAND Performed at [facility] [address], [place], [place] [address]    Special Requests   Final    BOTTLES DRAWN AEROBIC AND ANAEROBIC Blood Culture adequate volume Performed at [facility] [address] [place], [place] [address]    Culture   Final    NO GROWTH 5 DAYS Performed at [facility], [place] [address] [place], [place] [address]    Report Status 07/05/2021 FINAL  Final  Respiratory (~20 pathogens) panel by PCR     Status: None   Collection Time: 07/01/21 12:01 PM   Specimen: Nasopharyngeal Swab; Respiratory  Result Value Ref Range Status   Adenovirus NOT DETECTED NOT DETECTED Final   Coronavirus 229E NOT DETECTED NOT DETECTED Final    Comment: (NOTE) The Coronavirus on the Respiratory Panel, DOES NOT test for the novel  Coronavirus (2019 nCoV)    Coronavirus HKU1 NOT DETECTED NOT DETECTED Final   Coronavirus NL63 NOT DETECTED NOT DETECTED Final   Coronavirus OC43 NOT DETECTED NOT DETECTED Final   Metapneumovirus NOT DETECTED NOT DETECTED Final   Rhinovirus / Enterovirus NOT DETECTED NOT DETECTED Final   Influenza A NOT DETECTED NOT DETECTED Final  Influenza B NOT DETECTED NOT DETECTED Final   Parainfluenza Virus 1 NOT DETECTED NOT DETECTED Final   Parainfluenza Virus 2 NOT DETECTED NOT DETECTED Final   Parainfluenza Virus 3 NOT DETECTED NOT DETECTED Final   Parainfluenza Virus 4 NOT DETECTED NOT DETECTED Final   Respiratory Syncytial Virus NOT DETECTED NOT DETECTED Final   Bordetella pertussis NOT DETECTED NOT DETECTED Final   Bordetella Parapertussis NOT DETECTED NOT DETECTED Final    Chlamydophila pneumoniae NOT DETECTED NOT DETECTED Final   Mycoplasma pneumoniae NOT DETECTED NOT DETECTED Final    Comment: Performed at [facility], [place] [address], [place], [place] [address]  Pleural fluid culture w Gram Stain     Status: None   Collection Time: 07/02/21  3:18 PM   Specimen: Pleural Fluid  Result Value Ref Range Status   Specimen Description FLUID PLEURAL  Final   Special Requests NONE  Final   Gram Stain   Final    RARE WBC PRESENT,BOTH PMN AND MONONUCLEAR NO ORGANISMS SEEN    Culture   Final    NO GROWTH 3 DAYS Performed at [facility], [place] [address], [place], [place] [address]    Report Status 07/06/2021 FINAL  Final  Blood Culture (routine x 2)     Status: None (Preliminary result)   Collection Time: 07/07/21  8:25 PM   Specimen: BLOOD  Result Value Ref Range Status   Specimen Description   Final    BLOOD LEFT ANTECUBITAL Performed at [facility] [address] [place], [place] [address]    Special Requests   Final    BOTTLES DRAWN AEROBIC ONLY Blood Culture adequate volume Performed at [facility] [address] [place], [place] [address]    Culture   Final    NO GROWTH 1 DAY Performed at [facility], [place] [address], [place], [place] [address]    Report Status PENDING  Incomplete  Resp Panel by RT-PCR (Flu A&B, Covid) Nasopharyngeal Swab     Status: None   Collection Time: 07/07/21  8:52 PM   Specimen: Nasopharyngeal Swab; Nasopharyngeal(NP) swabs in vial transport medium  Result Value Ref Range Status   SARS Coronavirus 2 by RT PCR NEGATIVE NEGATIVE Final    Comment: (NOTE) SARS-CoV-2 target nucleic acids are NOT DETECTED.  The SARS-CoV-2 RNA is generally detectable in upper respiratory specimens during the acute phase of infection. The lowest concentration of SARS-CoV-2 viral copies this assay can detect is 138 copies/mL. A negative result does not preclude SARS-Cov-2 infection  and should not be used as the sole basis for treatment or other patient management decisions. A negative result may occur with  improper specimen collection/handling, submission of specimen other than nasopharyngeal swab, presence of viral mutation(s) within the areas targeted by this assay, and inadequate number of viral copies(<138 copies/mL). A negative result must be combined with clinical observations, patient history, and epidemiological information. The expected result is Negative.  Fact Sheet for Patients:  EntrepreneurPulse.com.au  Fact Sheet for Healthcare Providers:  IncredibleEmployment.be  This test is no t yet approved or cleared by the [place] FDA and  has been authorized for detection and/or diagnosis of SARS-CoV-2 by FDA under an Emergency Use Authorization (EUA). This EUA will remain  in effect (meaning this test can be used) for the duration of the COVID-19 declaration under Section 564(b)(1) of the Act, 21 U.S.C.section 360bbb-3(b)(1), unless the authorization is terminated  or revoked sooner.  Influenza A by PCR NEGATIVE NEGATIVE Final   Influenza B by PCR NEGATIVE NEGATIVE Final    Comment: (NOTE) The Xpert Xpress SARS-CoV-2/FLU/RSV plus assay is intended as an aid in the diagnosis of influenza from Nasopharyngeal swab specimens and should not be used as a sole basis for treatment. Nasal washings and aspirates are unacceptable for Xpert Xpress SARS-CoV-2/FLU/RSV testing.  Fact Sheet for Patients: EntrepreneurPulse.com.au  Fact Sheet for Healthcare Providers: IncredibleEmployment.be  This test is not yet approved or cleared by the [place] FDA and has been authorized for detection and/or diagnosis of SARS-CoV-2 by FDA under an Emergency Use Authorization (EUA). This EUA will remain in effect (meaning this test can be used) for the duration of the COVID-19 declaration  under Section 564(b)(1) of the Act, 21 U.S.C. section 360bbb-3(b)(1), unless the authorization is terminated or revoked.  Performed at [facility], [place] [address] [place], [place] [address]   Blood Culture (routine x 2)     Status: None (Preliminary result)   Collection Time: 07/08/21  6:19 AM   Specimen: BLOOD LEFT ARM  Result Value Ref Range Status   Specimen Description BLOOD LEFT ARM  Final   Special Requests   Final    BOTTLES DRAWN AEROBIC AND ANAEROBIC Blood Culture adequate volume   Culture   Final    NO GROWTH 1 DAY Performed at [facility], [place] [address], [place], [place] [address]    Report Status PENDING  Incomplete     Medications:    Chlorhexidine Gluconate Cloth  6 each Topical Q0600   cinacalcet  60 mg Oral QPM   fluticasone furoate-vilanterol  1 puff Inhalation Daily   hydrocortisone   Topical BID   metoprolol tartrate  50 mg Oral BID   [START ON 07/10/2021] midodrine  10 mg Oral Q M,W,F-HD   multivitamin  1 tablet Oral QHS   sevelamer carbonate  800-1,600 mg Oral TID with meals   Continuous Infusions:  cefTRIAXone (ROCEPHIN)  IV 1 g (07/09/21 0057)   doxycycline (VIBRAMYCIN) IV 100 mg (07/09/21 0859)      LOS: 2 days   Charlynne Cousins  [facility]  07/09/2021, 10:13 AM

## 2021-07-09 NOTE — Progress Notes (Signed)
CCMD called about patient O2 desat around 40's and leads are off. RN checked the patient.  He is sitting on the bed and nasal cannula is not on. Explain to the pt about oxygen level and importance of use of oxygen. Pt refused to be checked on and stated he would leave the hospital if RN insist.

## 2021-07-09 NOTE — Progress Notes (Signed)
[facility] Progress Note   Subjective:   HD yesterday limited by hypotension and treatment terminated early -- pt states he was feeling fine at the time.  Remains in AF with RVR, cardiology following.  Pt refusing care/monitoring overnight, threatening sign out AMA if disturbed.  This AM visiting with a friend.   Objective Vitals:   07/08/21 1941 07/08/21 1959 07/08/21 2238 07/09/21 0851  BP: (!) 79/53 130/85 (!) 118/59 118/83  Pulse: (!) 121  (!) 122   Resp:  (!) 21  18  Temp:  (!) 97.4 F (36.3 C)    TempSrc:  Oral    SpO2: 100% 97%    Weight:  58.5 kg    Height:       Physical Exam General: sitting up comfortably in bed Heart: tachy, no rub Lungs: normal WOB on O2 [place] Abdomen: soft Extremities: 1+ ankle edema Dialysis Access:  [facility] c/d/I, RUE AVF +t/b, small  Additional Objective Labs: Basic Metabolic Panel: Recent Labs  Lab 07/02/21 1600 07/03/21 0409 07/05/21 0614 07/07/21 2025 07/08/21 0608  NA 131*   < > 132* 132* 134*  K 4.8   < > 3.9 5.1 3.8  CL 97*   < > 96* 96* 98  CO2 21*   < > 22 20* 18*  GLUCOSE 158*   < > 104* 101* 90  BUN 39*   < > 32* 58* 56*  CREATININE 5.52*   < > 4.97* 7.79* 8.29*  CALCIUM 8.3*   < > 8.7* 8.7* 8.7*  PHOS 5.3*  --   --   --   --    < > = values in this interval not displayed.   Liver Function Tests: Recent Labs  Lab 07/04/21 0249 07/05/21 0614 07/07/21 2025  AST 738* 309* 102*  ALT 1,130* 888* 496*  ALKPHOS 85 118 159*  BILITOT 1.2 1.2 1.5*  PROT 5.5* 5.9* 6.8  ALBUMIN 2.2* 2.4* 3.1*   No results for input(s): LIPASE, AMYLASE in the last 168 hours. CBC: Recent Labs  Lab 07/03/21 0412 07/04/21 0249 07/07/21 2025 07/08/21 0608  WBC 7.4 9.6 7.2 8.7  HGB 9.1* 9.9* 10.0* 9.3*  HCT 28.9* 29.6* 31.0* 28.8*  MCV 101.8* 98.3 104.7* 100.7*  PLT 99* 125* 123* 105*   Blood Culture    Component Value Date/Time   SDES BLOOD LEFT ARM 07/08/2021 0619   SPECREQUEST  07/08/2021 0619    BOTTLES DRAWN  AEROBIC AND ANAEROBIC Blood Culture adequate volume   CULT  07/08/2021 0619    NO GROWTH 1 DAY Performed at [facility] [address], [place], [place] [address]    REPTSTATUS PENDING 07/08/2021 0093    Cardiac Enzymes: Recent Labs  Lab 07/03/21 1031  CKTOTAL 30*   CBG: Recent Labs  Lab 07/04/21 2336 07/05/21 0423 07/05/21 0814 07/05/21 1209 07/08/21 2345  GLUCAP 103* 124* 84 97 109*   Iron Studies: No results for input(s): IRON, TIBC, TRANSFERRIN, FERRITIN in the last 72 hours. @lablastinr3 @ Studies/Results: NM Pulmonary Perfusion  Result Date: 07/08/2021 CLINICAL DATA:  Chest pain, shortness of breath, history of blood clots EXAM: NUCLEAR MEDICINE PERFUSION LUNG SCAN TECHNIQUE: Perfusion images were obtained in multiple projections after intravenous injection of radiopharmaceutical. Ventilation scans intentionally deferred if perfusion scan and chest x-ray adequate for interpretation during COVID 19 epidemic. RADIOPHARMACEUTICALS:  3.91 mCi Tc-30m MAA IV COMPARISON:  Chest radiograph dated 07/07/2021. CTA chest dated 06/30/2021. FINDINGS: Normal perfusion, without wedge-shaped perfusion defects to suggest pulmonary embolism.  Corresponding chest radiograph demonstrates patchy opacities in the right mid and lower lungs. IMPRESSION: Negative for pulmonary embolism. Electronically Signed   By: Julian Hy M.D.   On: 07/08/2021 18:23   DG Chest Port 1 View  Result Date: 07/07/2021 CLINICAL DATA:  Altered mental status x2 days, questionable sepsis. EXAM: PORTABLE CHEST 1 VIEW COMPARISON:  Chest radiograph July 02, 2021 FINDINGS: Dual lumen right chest wall dialysis catheter with tip overlying the superior cavoatrial junction. The heart size and mediastinal contours are stable. Similar size of the small right pleural effusion. Interval increased aeration of the right lower lobe of a decrease in the right basilar consolidation. No new focal consolidation. The visualized  skeletal structures are unchanged. IMPRESSION: 1. Similar size of the small right pleural effusion with interval improved aeration of the right lower lobe and decreased in the right basilar consolidation. No new focal consolidation. Electronically Signed   By: Dahlia Bailiff M.D.   On: 07/07/2021 20:45   Medications:  cefTRIAXone (ROCEPHIN)  IV 1 g (07/09/21 0057)   doxycycline (VIBRAMYCIN) IV 100 mg (07/09/21 0859)    Chlorhexidine Gluconate Cloth  6 each Topical Q0600   cinacalcet  60 mg Oral QPM   fluticasone furoate-vilanterol  1 puff Inhalation Daily   hydrocortisone   Topical BID   metoprolol tartrate  50 mg Oral BID   multivitamin  1 tablet Oral QHS   sevelamer carbonate  800-1,600 mg Oral TID with meals    OP HD: MWF AF  4h  400/800  50kg   2/2 bath  Heparin 2000   RIJ TDC/ RUE AVF (maturing)  - sensipar 60 tiw  - venofer 50 weekly  - mircera 225 q2, last 10/19, due 11/2   Assessment/ Plan: Acute on chronic hypoxic resp failure: being treated for ongoing PNA.  CXR improving and no report of pulm edema but certainly s/s volume overload persist.  Attempting to UF but BP limiting so far -- cardiology planning cardioversion which hopefully will help.   ESRD on HD: MWF.  HD 11/7 - UF ~ 1L, limited by hypotension.  Will plan next HD tomorrow with maneuvers to help maximize UF and pre HD midodrine if ok with cardiology.  Hold heparin in light of c/f GIB.   Anemia:  Hb in 9s today, ? GIB.  Dosed with aranesp 200 11/3.  Transfused < 7.  Atrial flutter: recent dx.  On BB currently tachycardic into 120s.  Per Primary. Cardiology consulted during last admission and following now --  planning cardioversion.  BMM: on home binder, sensipar, renal diet.  Nausea/vomiting: thought to be a manifestation of volume overload/passive congestion - UF with HD per above.    Jannifer Hick MD 07/09/2021, 9:30 AM  [facility] Pager: [phone]

## 2021-07-09 NOTE — Progress Notes (Signed)
Cardiology Progress Note  Patient ID: Thomas Robinson MRN: [id number] DOB: February 03, 1961 Date of Encounter: 07/09/2021  Primary Cardiologist: [name], DO  Subjective   Chief Complaint: SOB  HPI: Heart rate remains in atrial flutter.  Feeling better after stopping blood pressure medications.  Still short of breath.  Denies chest pain.  ROS:  All other ROS reviewed and negative. Pertinent positives noted in the HPI.     Inpatient Medications  Scheduled Meds:  Chlorhexidine Gluconate Cloth  6 each Topical Q0600   cinacalcet  60 mg Oral QPM   fluticasone furoate-vilanterol  1 puff Inhalation Daily   hydrocortisone   Topical BID   metoprolol tartrate  50 mg Oral BID   multivitamin  1 tablet Oral QHS   sevelamer carbonate  800-1,600 mg Oral TID with meals   Continuous Infusions:  cefTRIAXone (ROCEPHIN)  IV 1 g (07/09/21 0057)   doxycycline (VIBRAMYCIN) IV 100 mg (07/09/21 0859)   PRN Meds: acetaminophen, albuterol, ferric citrate, hydrOXYzine, morphine injection, naLOXone (NARCAN)  injection, sevelamer carbonate   Vital Signs   Vitals:   07/08/21 1941 07/08/21 1959 07/08/21 2238 07/09/21 0851  BP: (!) 79/53 130/85 (!) 118/59 118/83  Pulse: (!) 121  (!) 122   Resp:  (!) 21  18  Temp:  (!) 97.4 F (36.3 C)    TempSrc:  Oral    SpO2: 100% 97%    Weight:  58.5 kg    Height:        Intake/Output Summary (Last 24 hours) at 07/09/2021 0911 Last data filed at 07/08/2021 1959 Gross per 24 hour  Intake 0 ml  Output 975 ml  Net -975 ml   Last 3 Weights 07/08/2021 07/08/2021 07/08/2021  Weight (lbs) 128 lb 15.5 oz 131 lb 6.3 oz 129 lb 13.6 oz  Weight (kg) 58.5 kg 59.6 kg 58.9 kg      Telemetry  Overnight telemetry shows atrial flutter heart rate 120, which I personally reviewed.   Physical Exam   Vitals:   07/08/21 1941 07/08/21 1959 07/08/21 2238 07/09/21 0851  BP: (!) 79/53 130/85 (!) 118/59 118/83  Pulse: (!) 121  (!) 122   Resp:  (!) 21  18  Temp:  (!) 97.4 F  (36.3 C)    TempSrc:  Oral    SpO2: 100% 97%    Weight:  58.5 kg    Height:        Intake/Output Summary (Last 24 hours) at 07/09/2021 0911 Last data filed at 07/08/2021 1959 Gross per 24 hour  Intake 0 ml  Output 975 ml  Net -975 ml    Last 3 Weights 07/08/2021 07/08/2021 07/08/2021  Weight (lbs) 128 lb 15.5 oz 131 lb 6.3 oz 129 lb 13.6 oz  Weight (kg) 58.5 kg 59.6 kg 58.9 kg    Body mass index is 19.61 kg/m.   General: Well nourished, well developed, in no acute distress Head: Atraumatic, normal size  Eyes: PEERLA, EOMI  Neck: Supple, JVD up to earlobes Endocrine: No thryomegaly Cardiac: Normal S1, S2; regular rhythm, tachycardia, 3 out of 6 holosystolic murmur Lungs: Clear to auscultation bilaterally, no wheezing, rhonchi or rales  Abd: Soft, nontender, no hepatomegaly  Ext: 1+ pitting edema Musculoskeletal: No deformities, BUE and BLE strength normal and equal Skin: Warm and dry, no rashes   Neuro: Alert and oriented to person, place, time, and situation, CNII-XII grossly intact, no focal deficits  Psych: Normal mood and affect   Labs  High Sensitivity Troponin:  Recent Labs  Lab 06/29/21 2237 06/30/21 0352 07/01/21 1329 07/02/21 0420  TROPONINIHS 62* 67* 144* 202*     Cardiac EnzymesNo results for input(s): TROPONINI in the last 168 hours. No results for input(s): TROPIPOC in the last 168 hours.  Chemistry Recent Labs  Lab 07/04/21 0249 07/05/21 0614 07/07/21 2025 07/08/21 0608  NA 133* 132* 132* 134*  K 5.5* 3.9 5.1 3.8  CL 99 96* 96* 98  CO2 19* 22 20* 18*  GLUCOSE 123* 104* 101* 90  BUN 58* 32* 58* 56*  CREATININE 7.22* 4.97* 7.79* 8.29*  CALCIUM 8.5* 8.7* 8.7* 8.7*  PROT 5.5* 5.9* 6.8  --   ALBUMIN 2.2* 2.4* 3.1*  --   AST 738* 309* 102*  --   ALT 1,130* 888* 496*  --   ALKPHOS 85 118 159*  --   BILITOT 1.2 1.2 1.5*  --   GFRNONAA 8* 12* 7* 7*  ANIONGAP 15 14 16* 18*    Hematology Recent Labs  Lab 07/04/21 0249 07/07/21 2025 07/08/21 0608   WBC 9.6 7.2 8.7  RBC 3.01* 2.96* 2.86*  HGB 9.9* 10.0* 9.3*  HCT 29.6* 31.0* 28.8*  MCV 98.3 104.7* 100.7*  MCH 32.9 33.8 32.5  MCHC 33.4 32.3 32.3  RDW 17.2* 18.8* 19.0*  PLT 125* 123* 105*   BNP Recent Labs  Lab 07/08/21 0608  BNP >4,500.0*    DDimer No results for input(s): DDIMER in the last 168 hours.   Radiology  NM Pulmonary Perfusion  Result Date: 07/08/2021 CLINICAL DATA:  Chest pain, shortness of breath, history of blood clots EXAM: NUCLEAR MEDICINE PERFUSION LUNG SCAN TECHNIQUE: Perfusion images were obtained in multiple projections after intravenous injection of radiopharmaceutical. Ventilation scans intentionally deferred if perfusion scan and chest x-ray adequate for interpretation during COVID 19 epidemic. RADIOPHARMACEUTICALS:  3.91 mCi Tc-30m MAA IV COMPARISON:  Chest radiograph dated 07/07/2021. CTA chest dated 06/30/2021. FINDINGS: Normal perfusion, without wedge-shaped perfusion defects to suggest pulmonary embolism. Corresponding chest radiograph demonstrates patchy opacities in the right mid and lower lungs. IMPRESSION: Negative for pulmonary embolism. Electronically Signed   By: Julian Hy M.D.   On: 07/08/2021 18:23   DG Chest Port 1 View  Result Date: 07/07/2021 CLINICAL DATA:  Altered mental status x2 days, questionable sepsis. EXAM: PORTABLE CHEST 1 VIEW COMPARISON:  Chest radiograph July 02, 2021 FINDINGS: Dual lumen right chest wall dialysis catheter with tip overlying the superior cavoatrial junction. The heart size and mediastinal contours are stable. Similar size of the small right pleural effusion. Interval increased aeration of the right lower lobe of a decrease in the right basilar consolidation. No new focal consolidation. The visualized skeletal structures are unchanged. IMPRESSION: 1. Similar size of the small right pleural effusion with interval improved aeration of the right lower lobe and decreased in the right basilar consolidation. No  new focal consolidation. Electronically Signed   By: Dahlia Bailiff M.D.   On: 07/07/2021 20:45    Cardiac Studies  Echo 07/02/2021: Hyperdynamic LV function, 70 to 75%, severe RV dilation with severely reduced RV function RUQ [place] 07/04/2021: Hepatic steatosis Cardiac Cath 08/27/2020 [facility]): Normal coronaries, Normal LVEDP PFT (06/26/2020 [facility]): Severe obstructive ventilatory defect  CT PE 06/30/2021: No PE V/Q: Negative   Patient Profile  60 year old male with history of ESRD on hemodialysis, hypertension, asthma, chronic hypoxic respiratory failure on home O2, ascending aortic aneurysm who was admitted on 07/07/2021 with weakness and fatigue and atrial flutter.  Recent admission between 06/29/2021 and 07/05/2021 for  septic shock and pneumonia.  Diagnosed with atrial flutter that admission.  Plan was for outpatient TEE/cardioversion.  Assessment & Plan   #Atrial flutter #Cor pulmonale #Severe pulm hypertension #RV failure -He has a known history of severe COPD.  Had pulmonary function testing to [facility] system with severe obstructive ventilatory defect.  Left heart catheterization [facility] in December showed normal coronary arteries with normal LVEDP.  He had a CT PE study that was negative for PE.  Nuclear medicine VQ scan negative for chronic thromboembolic pulmonary hypertension. -He does have moderate to severe LVH.  However given normal LVEDP I do not believe this is HFpEF related. -I believe his RV failure is likely related to severe lung disease.  I think it is beneficial to get pulmonary to see him.  I briefly discussed his case with advanced heart failure.  They have nothing to offer him as I believe this is group 3 pulmonary hypertension. -I believe his overall readmission from fatigue and not feeling well was from being on too much blood pressure medication.  We stopped his Norvasc and hydralazine. -He is in atrial flutter which is not helping.  We do need to pursue  TEE/cardioversion however he is having anemia and rectal bleeding.  GI is following for possible colonoscopy.  I think this needs to be done before we can get him back on Eliquis.  I do not believe he felt poorly from Eliquis before being on blood pressure medications in the setting of cor pulmonale with advanced right heart failure.  Unfortunately he really has no options for advanced therapies.  The mainstay of treatment will be heart and lung transplant however given ESRD and very poor functional status he is not a candidate for this.  I think the best we can do is medical management. -We will continue with Removal per dialysis.  He is still grossly volume overloaded.  -Okay to continue metoprolol for rate control.  Again this is flutter and will not help.  I highly suspect his rate is 120 because of severe tricuspid annular dilation. -He can still consider an outpatient PYP scan.  I do not believe this is necessary this admission.  I believe chronic hypoxic respiratory failure as etiology of his right heart failure.  #Severe pulmHTN #Cor pulmonale #Chronic hypoxic respiratory failure -2/2 lung disease see above  #Anemia #GI bleed -Needs TEE/cardioversion for atrial flutter.  However given rectal bleeding and anemia he will need to be evaluated by GI.  They are waiting their full evaluation for him to complete a bowel prep.  Apparently he cannot do this currently. -Highly suspect a lot of his early satiety as well as nausea is from right heart failure and passive congestion.  This will likely help with hemodialysis.  Needs aggressive volume removal. -Once his anemia has been evaluated and there are no signs of bleeding or impending bleeding risk we will proceed with Eliquis and TEE/cardioversion.  #ESRD -Suspect he is having a lot of passive congestion as well as got edema to explain his nausea.  Would recommend aggressive volume removal.  He remains grossly volume overloaded.   For questions  or updates, please contact [place] Please consult [email or web address] for contact info under   Time Spent with Patient: I have spent a total of 35 minutes with patient reviewing hospital notes, telemetry, EKGs, labs and examining the patient as well as establishing an assessment and plan that was discussed with the patient.  > 50% of time was spent  in direct patient care.    Signed, Addison Naegeli. Audie Box, MD, [place]  07/09/2021 9:11 AM

## 2021-07-10 ENCOUNTER — Inpatient Hospital Stay (HOSPITAL_COMMUNITY): Payer: Medicare Other | Admitting: Certified Registered Nurse Anesthetist

## 2021-07-10 ENCOUNTER — Encounter (HOSPITAL_COMMUNITY)
Admission: EM | Discharge status: Against Medical Advice | Payer: Self-pay | Source: Home / Self Care | Attending: Internal Medicine

## 2021-07-10 ENCOUNTER — Encounter (HOSPITAL_COMMUNITY): Payer: Self-pay | Admitting: Family Medicine

## 2021-07-10 DIAGNOSIS — R1110 Vomiting, unspecified: Secondary | ICD-10-CM

## 2021-07-10 DIAGNOSIS — K2970 Gastritis, unspecified, without bleeding: Secondary | ICD-10-CM

## 2021-07-10 DIAGNOSIS — K642 Third degree hemorrhoids: Secondary | ICD-10-CM

## 2021-07-10 HISTORY — PX: ESOPHAGOGASTRODUODENOSCOPY (EGD) WITH PROPOFOL: SHX5813

## 2021-07-10 HISTORY — PX: COLONOSCOPY WITH PROPOFOL: SHX5780

## 2021-07-10 HISTORY — PX: BIOPSY: SHX5522

## 2021-07-10 SURGERY — ESOPHAGOGASTRODUODENOSCOPY (EGD) WITH PROPOFOL
Anesthesia: Monitor Anesthesia Care

## 2021-07-10 MED ORDER — HEPARIN SODIUM (PORCINE) 1000 UNIT/ML IJ SOLN: 3200.0000 [IU] | Freq: Once | INTRAMUSCULAR | Status: AC

## 2021-07-10 MED ORDER — PROPOFOL 500 MG/50ML IV EMUL
INTRAVENOUS | Status: DC | PRN
  Administered 2021-07-10: 50 ug/kg/min via INTRAVENOUS

## 2021-07-10 MED ORDER — IPRATROPIUM-ALBUTEROL 0.5-2.5 (3) MG/3ML IN SOLN
3.0000 mL | Freq: Once | RESPIRATORY_TRACT | Status: AC
  Administered 2021-07-10: 3 mL via RESPIRATORY_TRACT

## 2021-07-10 MED ORDER — KETAMINE HCL 50 MG/5ML IJ SOSY
PREFILLED_SYRINGE | INTRAMUSCULAR | Status: AC
  Filled 2021-07-10: qty 5

## 2021-07-10 MED ORDER — HEPARIN SODIUM (PORCINE) 1000 UNIT/ML IJ SOLN
INTRAMUSCULAR | Status: AC
  Administered 2021-07-10: 3200 [IU] via INTRAVENOUS
  Filled 2021-07-10: qty 4

## 2021-07-10 MED ORDER — PROPOFOL 10 MG/ML IV BOLUS
INTRAVENOUS | Status: DC | PRN
  Administered 2021-07-10: 25 mg via INTRAVENOUS

## 2021-07-10 MED ORDER — MIDAZOLAM HCL 2 MG/2ML IJ SOLN
INTRAMUSCULAR | Status: DC | PRN
  Administered 2021-07-10 (×2): 1 mg via INTRAVENOUS

## 2021-07-10 MED ORDER — VASOPRESSIN 20 UNIT/ML IV SOLN
INTRAVENOUS | Status: DC | PRN
  Administered 2021-07-10 (×2): 2 [IU] via INTRAVENOUS

## 2021-07-10 MED ORDER — PHENYLEPHRINE HCL (PRESSORS) 10 MG/ML IV SOLN
INTRAVENOUS | Status: DC | PRN
  Administered 2021-07-10 (×2): 160 ug via INTRAVENOUS

## 2021-07-10 MED ORDER — SODIUM CHLORIDE 0.9 % IV SOLN
INTRAVENOUS | Status: DC | PRN
Start: 2021-07-10 — End: 2021-07-10

## 2021-07-10 MED ORDER — LIDOCAINE 2% (20 MG/ML) 5 ML SYRINGE
INTRAMUSCULAR | Status: DC | PRN
  Administered 2021-07-10: 50 mg via INTRAVENOUS

## 2021-07-10 MED ORDER — DOXYCYCLINE HYCLATE 100 MG PO TABS
100.0000 mg | ORAL_TABLET | Freq: Two times a day (BID) | ORAL | Status: DC
  Administered 2021-07-10 – 2021-07-11 (×2): 100 mg via ORAL
  Filled 2021-07-10 (×2): qty 1

## 2021-07-10 MED ORDER — KETAMINE HCL 10 MG/ML IJ SOLN
INTRAMUSCULAR | Status: DC | PRN
  Administered 2021-07-10: 25 mg via INTRAVENOUS

## 2021-07-10 MED ORDER — PHENYLEPHRINE HCL-NACL 20-0.9 MG/250ML-% IV SOLN
INTRAVENOUS | Status: DC | PRN
Start: 2021-07-10 — End: 2021-07-10
  Administered 2021-07-10: 25 ug/min via INTRAVENOUS

## 2021-07-10 MED ORDER — APIXABAN 2.5 MG PO TABS: 2.5000 mg | ORAL_TABLET | Freq: Two times a day (BID) | ORAL | Status: DC

## 2021-07-10 MED ORDER — MIDAZOLAM HCL 2 MG/2ML IJ SOLN: INTRAMUSCULAR | Status: DC | PRN

## 2021-07-10 SURGICAL SUPPLY — 15 items

## 2021-07-10 NOTE — Anesthesia Procedure Notes (Signed)
Procedure Name: MAC Date/Time: 07/10/2021 10:10 AM Performed by: Lowella Dell, CRNA Pre-anesthesia Checklist: Patient identified, Emergency Drugs available, Suction available, Patient being monitored and Timeout performed Patient Re-evaluated:Patient Re-evaluated prior to induction Oxygen Delivery Method: Nasal cannula Placement Confirmation: positive ETCO2 Dental Injury: Teeth and Oropharynx as per pre-operative assessment

## 2021-07-10 NOTE — Progress Notes (Signed)
MD notified, Dr Doristine Bosworth.

## 2021-07-10 NOTE — Progress Notes (Signed)
TRH night cross cover note:  I was notified by patient's RN that the patient has not yet consumed the totality of his overnight movie prep, but has noted multiple bowel movements since initiation of this prep.  I asked RN to try to assist and encourage the patient to complete the movie prep in order to increase likelihood of being able to proceed with plan for associated diagnostic evaluation.      Babs Bertin, DO Hospitalist

## 2021-07-10 NOTE — Op Note (Signed)
[facility] Patient Name: Thomas Robinson Procedure Date : 07/10/2021 MRN: [id number] Attending MD: Gerrit Heck , MD Date of Birth: [birth year] CSN: [id number] Age: 60 Admit Type: Inpatient Procedure:                Colonoscopy Indications:              Hematochezia, Anemia Providers:                Gerrit Heck, MD, Dulcy Fanny, Tyna Jaksch Technician Referring MD:              Medicines:                Monitored Anesthesia Care Complications:            No immediate complications. Estimated Blood Loss:     Estimated blood loss: none. Procedure:                Pre-Anesthesia Assessment:                           - Prior to the procedure, a History and Physical                            was performed, and patient medications and                            allergies were reviewed. The patient's tolerance of                            previous anesthesia was also reviewed. The risks                            and benefits of the procedure and the sedation                            options and risks were discussed with the patient.                            All questions were answered, and informed consent                            was obtained. Prior Anticoagulants: The patient has                            taken Eliquis (apixaban), last dose was 3 days                            prior to procedure. ASA Grade Assessment: III - A                            patient with severe systemic disease. After                            reviewing the  risks and benefits, the patient was                            deemed in satisfactory condition to undergo the                            procedure.                           - Prior to the procedure, a History and Physical                            was performed, and patient medications and                            allergies were reviewed. The patient's tolerance of                             previous anesthesia was also reviewed. The risks                            and benefits of the procedure and the sedation                            options and risks were discussed with the patient.                            All questions were answered, and informed consent                            was obtained. Prior Anticoagulants: The patient has                            taken Eliquis (apixaban), last dose was 3 days                            prior to procedure. ASA Grade Assessment: III - A                            patient with severe systemic disease. After                            reviewing the risks and benefits, the patient was                            deemed in satisfactory condition to undergo the                            procedure.                           After obtaining informed consent, the colonoscope  was passed under direct vision. Throughout the                            procedure, the patient's blood pressure, pulse, and                            oxygen saturations were monitored continuously. The                            [id number]-HQ190TL ([id number] Olympus peds colonoscope was                            introduced through the anus and advanced to the the                            cecum, identified by appendiceal orifice and                            ileocecal valve. The colonoscopy was performed                            without difficulty. The patient tolerated the                            procedure well. The quality of the bowel                            preparation was fair. The ileocecal valve,                            appendiceal orifice, and rectum were photographed. Scope In: 10:26:58 AM Scope Out: 10:39:31 AM Scope Withdrawal Time: 0 hours 8 minutes 36 seconds  Total Procedure Duration: 0 hours 12 minutes 33 seconds  Findings:      Hemorrhoids and skin tags were found on perianal exam.      A moderate amount of  semi-liquid stool was found in the entire colon.       Lavage of the area was performed using copious amounts of tap water,       resulting in clearance with fair visualization.      Petechiae were found in the transverse colon, in the ascending colon and       in the cecum. The appearance and location is most consistent with mild       barotrauma. The colon was then fully desufflated. The mucosa throughout       the remainder of the colon was otherwise normal appearing. Aside from       the hemorrhoids, no other stigmata of recent bleeding noted and there       was no blood in the GI lumen.      Non-bleeding internal hemorrhoids were found during retroflexion. The       hemorrhoids were medium-sized and Grade III (internal hemorrhoids that       prolapse but require manual reduction). Impression:               - Preparation of the colon was fair with pockets of  stool scattered throughout the colon, but overall                            the prep was considered adequate for the purposes                            of this study.                           - Hemorrhoids and skin tags found on perianal exam.                           - The viusalized colon mucosa was otherwise normal                            appearing. There were small petechiae in the right                            and transverse colon, most likely from mild                            barotrauma. No bleeding and no mucosal rents noted                            on careful inspection. The colon was then fully                            desufflated.                           - Non-bleeding internal hemorrhoids.                           - No specimens collected. Recommendation:           - Return patient to hospital ward for ongoing care.                           - Advance diet as tolerated.                           - Ok to restart anticoagulation in 3 days.                           -  Recommend high fiber diet with fiber supplement                            as needed to maintain soft stools.                           - If hemorrhoidal symptoms persist, recommend                            referral to Colo-Rectal surgery as outpatient for  evaluation and treatment.                           - Please do not hesitate to contact the GI service                            with additional questions or concerns. Procedure Code(s):        --- Professional ---                           [phone], Colonoscopy, flexible; diagnostic, including                            collection of specimen(s) by brushing or washing,                            when performed (separate procedure) Diagnosis Code(s):        --- Professional ---                           K64.2, Third degree hemorrhoids                           K63.89, Other specified diseases of intestine                           K92.1, Melena (includes Hematochezia)                           D50.0, Iron deficiency anemia secondary to blood                            loss (chronic) CPT copyright 2019 [employer]. All rights reserved. The codes documented in this report are preliminary and upon coder review may  be revised to meet current compliance requirements. Gerrit Heck, MD 07/10/2021 11:09:26 AM Number of Addenda: 0

## 2021-07-10 NOTE — Interval H&P Note (Signed)
History and Physical Interval Note:  Patient refused to complete entire bowel prep overnight.  No new labs for review today.  He would still like to proceed with EGD and colonoscopy.   Did discuss increased likelihood of an incomplete colonoscopy, which could require further bowel preparation and repeat examination.  Offered postponing both procedures to tomorrow with additional prep today, but he adamantly declines, and would like to proceed with EGD/colonoscopy as planned today. Based on clinical presentation, can certainly perform upper endoscopy and reasonable to attempt colonoscopy for diagnostic and therapeutic intent.   07/10/2021 9:23 AM  Thomas Robinson  has presented today for surgery, with the diagnosis of Blood per rectum.  Anemia..  The various methods of treatment have been discussed with the patient and family. After consideration of risks, benefits and other options for treatment, the patient has consented to  Procedure(s): ESOPHAGOGASTRODUODENOSCOPY (EGD) WITH PROPOFOL (N/A) and COLONOSCOPY WITH PROPOFOL as a surgical intervention.  The patient's history has been reviewed, patient examined, no change in status, stable for surgery.  I have reviewed the patient's chart and labs.  Questions were answered to the patient's satisfaction.     Dominic Pea Shaundra Fullam

## 2021-07-10 NOTE — Op Note (Signed)
[facility] Patient Name: Thomas Robinson Procedure Date : 07/10/2021 MRN: [id number] Attending MD: Gerrit Heck , MD Date of Birth: 04-05-1961 CSN: [id number] Age: 60 Admit Type: Inpatient Procedure:                Upper GI endoscopy Indications:              Iron deficiency anemia secondary to chronic blood                            loss, Hematochezia, Nausea with vomiting Providers:                Gerrit Heck, MD, Dulcy Fanny, Tyna Jaksch Technician Referring MD:              Medicines:                Monitored Anesthesia Care Complications:            No immediate complications. Estimated Blood Loss:     Estimated blood loss was minimal. Procedure:                Pre-Anesthesia Assessment:                           - Prior to the procedure, a History and Physical                            was performed, and patient medications and                            allergies were reviewed. The patient's tolerance of                            previous anesthesia was also reviewed. The risks                            and benefits of the procedure and the sedation                            options and risks were discussed with the patient.                            All questions were answered, and informed consent                            was obtained. Prior Anticoagulants: The patient has                            taken Eliquis (apixaban), last dose was 3 days                            prior to procedure. ASA Grade Assessment: III - A  patient with severe systemic disease. After                            reviewing the risks and benefits, the patient was                            deemed in satisfactory condition to undergo the                            procedure.                           After obtaining informed consent, the endoscope was                            passed under direct vision.  Throughout the                            procedure, the patient's blood pressure, pulse, and                            oxygen saturations were monitored continuously. The                            GIF-H190 ([id number]) Olympus endoscope was introduced                            through the mouth, and advanced to the second part                            of duodenum. The upper GI endoscopy was                            accomplished without difficulty. The patient                            tolerated the procedure well. Scope In: Scope Out: Findings:      The examined esophagus was normal.      The gastroesophageal flap valve was visualized endoscopically and       classified as Hill Grade III (minimal fold, loose to endoscope, hiatal       hernia likely).      Scattered minimal inflammation characterized by erythema was found in       the gastric body and in the gastric antrum. Biopsies were taken with a       cold forceps for histology. Estimated blood loss was minimal.      Localized moderately erythematous mucosa without active bleeding and       with no stigmata of bleeding was found in the duodenal bulb. Biopsies       were taken with a cold forceps for histology. Estimated blood loss was       minimal.      The second portion of the duodenum was normal. Impression:               - Normal esophagus.                           -  Gastroesophageal flap valve classified as Hill                            Grade III (minimal fold, loose to endoscope, hiatal                            hernia likely).                           - Gastritis. Biopsied.                           - Erythematous duodenopathy. Biopsied.                           - Normal second portion of the duodenum. Recommendation:           - Return patient to hospital ward for ongoing care.                           - Advance diet as tolerated.                           - Continue present medications.                            - Use Protonix (pantoprazole) 40 mg PO BID for 6                            weeks to promote mucosal healing of gastritis and                            duodenitis, then reduce to 40 mg daily and                            eventually titrate off if no ongoing GI symptoms.                           - Await pathology results.                           - Ok to start anticoagulation in 3 days.                           - Perform a colonoscopy today. Procedure Code(s):        --- Professional ---                           [phone], Esophagogastroduodenoscopy, flexible,                            transoral; with biopsy, single or multiple Diagnosis Code(s):        --- Professional ---                           K29.70, Gastritis, unspecified, without bleeding  K31.89, Other diseases of stomach and duodenum                           D50.0, Iron deficiency anemia secondary to blood                            loss (chronic)                           K92.1, Melena (includes Hematochezia)                           R11.2, Nausea with vomiting, unspecified CPT copyright 2019 American Medical Association. All rights reserved. The codes documented in this report are preliminary and upon coder review may  be revised to meet current compliance requirements. Gerrit Heck, MD 07/10/2021 10:59:45 AM Number of Addenda: 0

## 2021-07-10 NOTE — Progress Notes (Addendum)
[facility] Progress Note   Subjective:Seen in room. Eating, still appears slightly sedated from endoscopy. HF 114 Aflutter. Concerned he may not tolerate HD. Says he wants to go home and sit on his porch. Conversation rambling.     Objective Vitals:   07/10/21 1120 07/10/21 1130 07/10/21 1140 07/10/21 1208  BP: (!) 88/70 (!) 87/63 (!) 87/53 95/66  Pulse: (!) 111 (!) 111  (!) 115  Resp: _0 Temp:    97.6 F (36.4 C)  TempSrc:    Oral  SpO2: 98% 93%  93%  Weight:      Height:       Physical Exam General: Chronically ill appearing male in NAD Heart: S1,S2 HR 114 aflutter on monitor.  Lungs:  decreased in bases otherwise CTAB No WOB Abdomen:Soft, NABS Extremities:1+BLE edema Dialysis Access: [facility] c/d/I, RUE AVF +t/b, small   Additional Objective Labs: Basic Metabolic Panel: Recent Labs  Lab 07/05/21 0614 07/07/21 2025 07/08/21 0608  NA 132* 132* 134*  K 3.9 5.1 3.8  CL 96* 96* 98  CO2 22 20* 18*  GLUCOSE 104* 101* 90  BUN 32* 58* 56*  CREATININE 4.97* 7.79* 8.29*  CALCIUM 8.7* 8.7* 8.7*   Liver Function Tests: Recent Labs  Lab 07/04/21 0249 07/05/21 0614 07/07/21 2025  AST 738* 309* 102*  ALT 1,130* 888* 496*  ALKPHOS 85 118 159*  BILITOT 1.2 1.2 1.5*  PROT 5.5* 5.9* 6.8  ALBUMIN 2.2* 2.4* 3.1*   No results for input(s): LIPASE, AMYLASE in the last 168 hours. CBC: Recent Labs  Lab 07/04/21 0249 07/07/21 2025 07/08/21 0608  WBC 9.6 7.2 8.7  HGB 9.9* 10.0* 9.3*  HCT 29.6* 31.0* 28.8*  MCV 98.3 104.7* 100.7*  PLT 125* 123* 105*   Blood Culture    Component Value Date/Time   SDES BLOOD LEFT ARM 07/08/2021 0619   SPECREQUEST  07/08/2021 0619    BOTTLES DRAWN AEROBIC AND ANAEROBIC Blood Culture adequate volume   CULT  07/08/2021 0619    NO GROWTH 2 DAYS Performed at [facility], [place] [address], [place], [place] [address]    REPTSTATUS PENDING 07/08/2021 2878    Cardiac Enzymes: No results for input(s): CKTOTAL,  CKMB, CKMBINDEX, TROPONINI in the last 168 hours. CBG: Recent Labs  Lab 07/04/21 2336 07/05/21 0423 07/05/21 0814 07/05/21 1209 07/08/21 2345  GLUCAP 103* 124* 84 97 109*   Iron Studies: No results for input(s): IRON, TIBC, TRANSFERRIN, FERRITIN in the last 72 hours. _1 @ Studies/Results: NM Pulmonary Perfusion  Result Date: 07/08/2021 CLINICAL DATA:  Chest pain, shortness of breath, history of blood clots EXAM: NUCLEAR MEDICINE PERFUSION LUNG SCAN TECHNIQUE: Perfusion images were obtained in multiple projections after intravenous injection of radiopharmaceutical. Ventilation scans intentionally deferred if perfusion scan and chest x-ray adequate for interpretation during COVID 19 epidemic. RADIOPHARMACEUTICALS:  3.91 mCi Tc-52mMAA IV COMPARISON:  Chest radiograph dated 07/07/2021. CTA chest dated 06/30/2021. FINDINGS: Normal perfusion, without wedge-shaped perfusion defects to suggest pulmonary embolism. Corresponding chest radiograph demonstrates patchy opacities in the right mid and lower lungs. IMPRESSION: Negative for pulmonary embolism. Electronically Signed   By: SJulian HyM.D.   On: 07/08/2021 18:23   Medications:  cefTRIAXone (ROCEPHIN)  IV 1 g (07/10/21 0012)    [START ON 07/13/2021] apixaban  2.5 mg Oral BID   Chlorhexidine Gluconate Cloth  6 each Topical Q0600   cinacalcet  60 mg Oral QPM   doxycycline  100 mg Oral Q12H  fluticasone furoate-vilanterol  1 puff Inhalation Daily   hydrocortisone   Topical BID   metoprolol tartrate  50 mg Oral BID   midodrine  10 mg Oral Q M,W,F-HD   multivitamin  1 tablet Oral QHS   peg 3350 powder  0.5 kit Oral Once   sevelamer carbonate  800-1,600 mg Oral TID with meals     OP HD: MWF AF  4h  400/800  50kg   2/2 bath RIJ TDC/ RUE AVF (maturing)  -Heparin 2000 units IV TIW  - sensipar  60 mg PO tiw  - venofer 50 mg IV weekly  - mircera 225 mcg IV q 2 weeks, last 10/19, due 11/2   Assessment/ Plan: Acute on  chronic hypoxic resp failure: being treated for ongoing PNA.  CXR improving and no report of pulm edema but certainly s/s volume overload persist.  Attempting to UF but BP limiting so far -- cardiology planning cardioversion which hopefully will help.   Aflutter: Needs TEE/Cardioversion but having issues with GIB. Went for Endo today. Currently HR 110-114. Aflutter. Per cardiology. RV Failure in setting of COPD. Issues with volume removal R/T Aflutter. Cardiology following.  GIB-Went for endo/colonoscopy today. No active source of bleeding found. HGB 9 range. Per primary.  ESRD on HD: MWF.  HD 11/7 - UF ~ 1L, limited by hypotension.  Will plan next HD tomorrow with maneuvers to help maximize UF and pre HD midodrine if ok with cardiology.  Hold heparin in light of c/f GIB.   Hypotension/Volume: HD terminated early D/T elevated HR with minimal volume removal. Attempting HD with midodrine today. Very much above OP EDW. UF as tolerated.  Anemia:  Hb in 9s today, ? GIB.  Dosed with aranesp 200 11/3.  Transfuse PRN  BMM: on home binder, sensipar, renal diet.  Nausea/vomiting: thought to be a manifestation of volume overload/passive congestion - UF with HD per above.   Rita H. Brown NP-C 07/10/2021, 12:28 PM  [employer] [phone]  I personally saw and evaluated the patient and agree with the assessment and plan by Juanell Fairly, NP.   Jannifer Hick MD [facility] Pager [phone]

## 2021-07-10 NOTE — Progress Notes (Signed)
Pt signed out AMA, was getting ready to go afterwards and slid out of the bed to the floor. Vital signs were taken, patient put back to bed. On talking to family, pt decided to stay but still refusing treatments. Pt spoke to family who wants him to stay, pt is weak and cannot walk on his own. So far pt is staying. Post fall completed.

## 2021-07-10 NOTE — Progress Notes (Signed)
Patient with hypotension during hemodialysis treatment. UF was turned off 200 normal saline bolus given, and bp rechecked multiple times. Bp improved 138/64. Thomas Robinson notified. Telephone orders given to restart UF and attempt to remove fluid as tolerated. UF turned back on.

## 2021-07-10 NOTE — Anesthesia Postprocedure Evaluation (Signed)
Anesthesia Post Note  Patient: Fadel Clason  Procedure(s) Performed: ESOPHAGOGASTRODUODENOSCOPY (EGD) WITH PROPOFOL COLONOSCOPY WITH PROPOFOL BIOPSY     Patient location during evaluation: PACU Anesthesia Type: MAC Level of consciousness: awake and alert Pain management: pain level controlled Vital Signs Assessment: post-procedure vital signs reviewed and stable Respiratory status: spontaneous breathing, nonlabored ventilation, respiratory function stable and patient connected to nasal cannula oxygen Cardiovascular status: stable and blood pressure returned to baseline Postop Assessment: no apparent nausea or vomiting Anesthetic complications: no   No notable events documented.  Last Vitals:  Vitals:   07/10/21 1140 07/10/21 1208  BP: (!) 87/53 95/66  Pulse:  (!) 115  Resp: 19 17  Temp:  36.4 C  SpO2:  93%    Last Pain:  Vitals:   07/10/21 1208  TempSrc: Oral  PainSc:                  March Rummage Jamile Sivils

## 2021-07-10 NOTE — Progress Notes (Signed)
Pt signed out AMA, adamant he is not staying here. Pt made aware we cannot take him downstairs or dress him. Pt given new socks and paper scrubs, signed ama form, IV's removed, and MD Dr Doristine Bosworth notified.

## 2021-07-10 NOTE — Anesthesia Preprocedure Evaluation (Addendum)
Anesthesia Evaluation  Patient identified by MRN, date of birth, ID band Patient awake    Reviewed: Allergy & Precautions, NPO status , Patient's Chart, lab work & pertinent test results  Airway Mallampati: II  TM Distance: >3 FB     Dental  (+) Poor Dentition   Pulmonary asthma , COPD,  COPD inhaler,    Pulmonary exam normal        Cardiovascular hypertension, Pt. on medications and Pt. on home beta blockers +CHF  + Valvular Problems/Murmurs AS  Rhythm:Regular Rate:Normal     Neuro/Psych CVA negative psych ROS   GI/Hepatic Neg liver ROS, GERD  ,Rectal bleeding   Endo/Other  negative endocrine ROS  Renal/GU   negative genitourinary   Musculoskeletal  (+) Arthritis ,   Abdominal Normal abdominal exam  (+)   Peds  Hematology  (+) anemia ,   Anesthesia Other Findings   Reproductive/Obstetrics                            Anesthesia Physical Anesthesia Plan  ASA: 3  Anesthesia Plan: MAC   Post-op Pain Management:    Induction: Intravenous  PONV Risk Score and Plan: 1 and Treatment may vary due to age or medical condition and Propofol infusion  Airway Management Planned: Simple Face Mask, Natural Airway and Nasal Cannula  Additional Equipment: None  Intra-op Plan:   Post-operative Plan:   Informed Consent: I have reviewed the patients History and Physical, chart, labs and discussed the procedure including the risks, benefits and alternatives for the proposed anesthesia with the patient or authorized representative who has indicated his/her understanding and acceptance.     Dental advisory given  Plan Discussed with: CRNA  Anesthesia Plan Comments: (Lab Results      Component                Value               Date                      WBC                      8.7                 07/08/2021                HGB                      9.3 (L)             07/08/2021                 HCT                      28.8 (L)            07/08/2021                MCV                      100.7 (H)           07/08/2021                PLT                      105 (L)  07/08/2021           Lab Results      Component                Value               Date                      NA                       134 (L)             07/08/2021                K                        3.8                 07/08/2021                CO2                      18 (L)              07/08/2021                GLUCOSE                  90                  07/08/2021                BUN                      56 (H)              07/08/2021                CREATININE               8.29 (H)            07/08/2021                CALCIUM                  8.7 (L)             07/08/2021                GFRNONAA                 7 (L)               07/08/2021           ECHO 11/22: 1. Strain attempted but patient asked to stop. Would recomm limited echo  with strain only.  2. LV cavitiy is small. Turburlent flow through LV. Near caviity  obliteration. . Left ventricular ejection fraction, by estimation, is 70  to 75%. The left ventricle has hyperdynamic function. The left ventricle  has no regional wall motion abnormalities.  There is severe concentric left ventricular hypertrophy. Left ventricular  diastolic function could not be evaluated.  3. Right ventricular systolic function is moderately reduced. The right  ventricular size is mildly enlarged. Mildly increased right ventricular  wall thickness.  4. Left atrial size was severely dilated.  5. Right atrial size was severely dilated.  6. Mild mitral valve regurgitation. Moderate to  severe mitral annular  calcification.  7. Tricuspid valve regurgitation is moderate.  8. AV is thickened with restricted motion. Peak and mean gradients  through the valve are 22 and 12 mm hg respectively. Dimensionless gradient  is 0.53 consistent with mild AS 2 D  imaging suggests that it is moderate.  No significant change in gradient  from May 2022. Aortic valve regurgitation is mild.  9. Aortic dilatation noted. There is mild dilatation of the ascending  aorta, measuring 40 mm. )        Anesthesia Quick Evaluation

## 2021-07-10 NOTE — Progress Notes (Signed)
Pt refusing bed alarm tonight.

## 2021-07-10 NOTE — Progress Notes (Signed)
PROGRESS NOTE    Thomas Robinson  KCL:[id number] DOB: [birth year] DOA: 07/07/2021 PCP: Thomas Niece, PA   Brief Narrative:  Thomas Robinson is an 60 y.o. male past medical history significant for chronic hypoxic respiratory failure on 2 L of oxygen, chronic diastolic heart failure end-stage renal disease on hemodialysis Monday Wednesday and Friday, she also has a history of a thoracic aortic aneurysm, atrial flutter status postconversion on Eliquis, essential hypertension who recently left [place] on 07/05/2021 during this admission he underwent thoracocentesis for right-sided pleural effusion which was transudative in the setting of pneumonia and septic shock after at the his house started developing bright red blood per rectum he stopped his Eliquis on his own started getting weak brought into the ED hypoxic, and atrial flutter and encephalopathy placed on 6 L of oxygen.  Hemoglobin of 10 (right about baseline).  FOBT positive, potassium 5, sodium 132 CO2 28 creatinine up to 7 (on the day he left AMA was 5) chest x-ray showed a small right-sided pleural effusion and resolving consolidation with started empirically on doxycycline and cefepime.  Assessment & Plan:   Principal Problem:   Acute on chronic respiratory failure with hypoxia (HCC) Active Problems:   Metabolic acidosis   ESRD on dialysis ([place])   Chronic diastolic heart failure (HCC)   Anemia in ESRD (end-stage renal disease) (HCC)   Atrial flutter (HCC)   Decubitus ulcer of coccyx, stage 2 (HCC)   Prolonged QT interval   Hematochezia   Vomiting   Gastritis and gastroduodenitis   Grade III hemorrhoids  Acute on chronic respiratory failure with hypoxia of unclear etiology: Question right-sided pneumonia.  He did complete a 6-day course of antibiotics before he left AGAINST MEDICAL ADVICE. PFTs showed fixed severe obstruction. VQ scan was negative for chronic PEs. Continue doxycycline and cefepime and complete  for 5 days. Patient is currently on 2 L oxygen.  He uses 2 to 5 L of oxygen at home.  Some concern of possible interstitial lung disease causing his hypoxia.  Will obtain high-resolution CT scan.  If any concern, will obtain PCCM consultation   Hematochezia /anemia of chronic disease/gastritis/internal hemorrhoids: He was on Eliquis for atrial flutter which he stopped prior to coming to admission.  He relates bright red blood per rectum. FOBT was positive.  GI consulted.  He underwent EGD and colonoscopy 07/10/2021 and was found to have internal hemorrhoids, some barotrauma, gastritis but no active bleeding.  GI recommended continuing Protonix 40 mg p.o. twice daily for 6 weeks resuming Eliquis in 3 days.  Nausea and vomiting: No symptoms today.   Typical Atrial flutter: Now back in atrial flutter cardiology was consulted during his last admission there was a discussion of about TEE cardioversion once he had recovered from his acute illness. Cardiology recommended TEE cardioversion when he is cleared from a GI standpoint due to his hematochezia.  Awaiting for cardiology to provide further recommendations and manage this.   High anion gap metabolic acidosis: No labs from 2 days.  Last CO2 was 18.  He is on dialysis.  Will defer to nephrology.   Chronic diastolic heart failure: Appears euvolemic. Continue further management per HD.  Sick euthyroid syndrome: With initiation of 6.6 Free T4 unremarkable need to be rechecked in 6 weeks.  Elevated LFTs: On his last admission it was greater than 2000 probably due to septic shock, LFTs improving.   End-stage renal disease on hemodialysis: Nephrology on board.   Essential hypertension: Blood pressure  on the low side, Norvasc and hydralazine stopped.   DVT prophylaxis: apixaban (ELIQUIS) tablet 2.5 mg Start: 07/13/21 1000 SCDs Start: 07/07/21 2322   Code Status: Full Code  Family Communication:  None present at bedside.  Plan of care discussed  with patient in length and he verbalized understanding and agreed with it.  Status is: Inpatient  Remains inpatient appropriate because: Needs further management of his medical issues.  Estimated body mass index is 19.61 kg/m as calculated from the following:   Height as of this encounter: _0  (1.727 m).   Weight as of this encounter: 58.5 kg.  Pressure Injury 07/01/21 Coccyx Mid Stage 2 -  Partial thickness loss of dermis presenting as a shallow open injury with a red, pink wound bed without slough. (Active)  07/01/21 1300  Location: Coccyx  Location Orientation: Mid  Staging: Stage 2 -  Partial thickness loss of dermis presenting as a shallow open injury with a red, pink wound bed without slough.  Wound Description (Comments):   Present on Admission: Yes    Nutritional Assessment: Body mass index is 19.61 kg/m.[name] Seen by dietician.  I agree with the assessment and plan as outlined below: Nutrition Status:        .  Skin Assessment: I have examined the patient's skin and I agree with the wound assessment as performed by the wound care RN as outlined below: Pressure Injury 07/01/21 Coccyx Mid Stage 2 -  Partial thickness loss of dermis presenting as a shallow open injury with a red, pink wound bed without slough. (Active)  07/01/21 1300  Location: Coccyx  Location Orientation: Mid  Staging: Stage 2 -  Partial thickness loss of dermis presenting as a shallow open injury with a red, pink wound bed without slough.  Wound Description (Comments):   Present on Admission: Yes    Consultants:  GI, nephrology and cardiology  Procedures:  EGD/colonoscopy  Antimicrobials:  Anti-infectives (From admission, onward)    Start     Dose/Rate Route Frequency Ordered Stop   07/10/21 1315  doxycycline (VIBRA-TABS) tablet 100 mg        100 mg Oral Every 12 hours 07/10/21 1222 07/13/21 0959   07/08/21 1000  doxycycline (VIBRAMYCIN) 100 mg in sodium chloride 0.9 % 250 mL IVPB  Status:   Discontinued        100 mg 125 mL/hr over 120 Minutes Intravenous Every 12 hours 07/07/21 2323 07/10/21 1222   07/08/21 0000  cefTRIAXone (ROCEPHIN) 1 g in sodium chloride 0.9 % 100 mL IVPB        1 g 200 mL/hr over 30 Minutes Intravenous Every 24 hours 07/07/21 2323 07/12/21 2359   07/07/21 2045  ceFEPIme (MAXIPIME) 1 g in sodium chloride 0.9 % 100 mL IVPB        1 g 200 mL/hr over 30 Minutes Intravenous  Once 07/07/21 2030 07/07/21 2110   07/07/21 2030  ceFEPIme (MAXIPIME) 2 g in sodium chloride 0.9 % 100 mL IVPB  Status:  Discontinued        2 g 200 mL/hr over 30 Minutes Intravenous  Once 07/07/21 2025 07/07/21 2030   07/07/21 2030  doxycycline (VIBRAMYCIN) 100 mg in sodium chloride 0.9 % 250 mL IVPB  Status:  Discontinued        100 mg 125 mL/hr over 120 Minutes Intravenous  Once 07/07/21 2025 07/07/21 2350          Subjective: Patient seen and examined after the procedures.  He was  slightly groggy but able to communicate.  He looked comfortable.  He had no complaints.  Objective: Vitals:   07/10/21 1120 07/10/21 1130 07/10/21 1140 07/10/21 1208  BP: (!) 88/70 (!) 87/63 (!) 87/53 95/66  Pulse: (!) 111 (!) 111  (!) 115  Resp: _0 Temp:    97.6 F (36.4 C)  TempSrc:    Oral  SpO2: 98% 93%  93%  Weight:      Height:        Intake/Output Summary (Last 24 hours) at 07/10/2021 1236 Last data filed at 07/10/2021 1037 Gross per 24 hour  Intake 850 ml  Output --  Net 850 ml   Filed Weights   07/08/21 1637 07/08/21 1959 07/10/21 0902  Weight: 59.6 kg 58.5 kg 58.5 kg    Examination:  General exam: Appears calm and comfortable  Respiratory system: Clear to auscultation. Respiratory effort normal. Cardiovascular system: S1 & S2 heard, RRR. No JVD, murmurs, rubs, gallops or clicks. No pedal edema. Gastrointestinal system: Abdomen is nondistended, soft and nontender. No organomegaly or masses felt. Normal bowel sounds heard. Central nervous system: Slightly groggy  but oriented. No focal neurological deficits. Extremities: Symmetric 5 x 5 power. Skin: No rashes, lesions or ulcers   Data Reviewed: I have personally reviewed following labs and imaging studies  CBC: Recent Labs  Lab 07/04/21 0249 07/07/21 2025 07/08/21 0608  WBC 9.6 7.2 8.7  HGB 9.9* 10.0* 9.3*  HCT 29.6* 31.0* 28.8*  MCV 98.3 104.7* 100.7*  PLT 125* 123* 191*   Basic Metabolic Panel: Recent Labs  Lab 07/04/21 0249 07/05/21 0614 07/07/21 2025 07/08/21 0608  NA 133* 132* 132* 134*  K 5.5* 3.9 5.1 3.8  CL 99 96* 96* 98  CO2 19* 22 20* 18*  GLUCOSE 123* 104* 101* 90  BUN 58* 32* 58* 56*  CREATININE 7.22* 4.97* 7.79* 8.29*  CALCIUM 8.5* 8.7* 8.7* 8.7*   GFR: Estimated Creatinine Clearance: 7.6 mL/min (A) (by C-G formula based on SCr of 8.29 mg/dL (H)). Liver Function Tests: Recent Labs  Lab 07/04/21 0249 07/05/21 0614 07/07/21 2025  AST 738* 309* 102*  ALT 1,130* 888* 496*  ALKPHOS 85 118 159*  BILITOT 1.2 1.2 1.5*  PROT 5.5* 5.9* 6.8  ALBUMIN 2.2* 2.4* 3.1*   No results for input(s): LIPASE, AMYLASE in the last 168 hours. No results for input(s): AMMONIA in the last 168 hours. Coagulation Profile: Recent Labs  Lab 07/04/21 1841 07/07/21 2159  INR 1.5* 1.6*   Cardiac Enzymes: No results for input(s): CKTOTAL, CKMB, CKMBINDEX, TROPONINI in the last 168 hours. BNP (last 3 results) No results for input(s): PROBNP in the last 8760 hours. HbA1C: No results for input(s): HGBA1C in the last 72 hours. CBG: Recent Labs  Lab 07/04/21 2336 07/05/21 0423 07/05/21 0814 07/05/21 1209 07/08/21 2345  GLUCAP 103* 124* 84 97 109*   Lipid Profile: No results for input(s): CHOL, HDL, LDLCALC, TRIG, CHOLHDL, LDLDIRECT in the last 72 hours. Thyroid Function Tests: No results for input(s): TSH, T4TOTAL, FREET4, T3FREE, THYROIDAB in the last 72 hours. Anemia Panel: No results for input(s): VITAMINB12, FOLATE, FERRITIN, TIBC, IRON, RETICCTPCT in the last 72  hours. Sepsis Labs: Recent Labs  Lab 07/07/21 2109 07/08/21 0608  LATICACIDVEN 1.5 1.2    Recent Results (from the past 240 hour(s))  Respiratory (~20 pathogens) panel by PCR     Status: None   Collection Time: 07/01/21 12:01 PM   Specimen: Nasopharyngeal Swab; Respiratory  Result Value  Ref Range Status   Adenovirus NOT DETECTED NOT DETECTED Final   Coronavirus 229E NOT DETECTED NOT DETECTED Final    Comment: (NOTE) The Coronavirus on the Respiratory Panel, DOES NOT test for the novel  Coronavirus (2019 nCoV)    Coronavirus HKU1 NOT DETECTED NOT DETECTED Final   Coronavirus NL63 NOT DETECTED NOT DETECTED Final   Coronavirus OC43 NOT DETECTED NOT DETECTED Final   Metapneumovirus NOT DETECTED NOT DETECTED Final   Rhinovirus / Enterovirus NOT DETECTED NOT DETECTED Final   Influenza A NOT DETECTED NOT DETECTED Final   Influenza B NOT DETECTED NOT DETECTED Final   Parainfluenza Virus 1 NOT DETECTED NOT DETECTED Final   Parainfluenza Virus 2 NOT DETECTED NOT DETECTED Final   Parainfluenza Virus 3 NOT DETECTED NOT DETECTED Final   Parainfluenza Virus 4 NOT DETECTED NOT DETECTED Final   Respiratory Syncytial Virus NOT DETECTED NOT DETECTED Final   Bordetella pertussis NOT DETECTED NOT DETECTED Final   Bordetella Parapertussis NOT DETECTED NOT DETECTED Final   Chlamydophila pneumoniae NOT DETECTED NOT DETECTED Final   Mycoplasma pneumoniae NOT DETECTED NOT DETECTED Final    Comment: Performed at [facility], [place] [address], [place], [place] [address]  Pleural fluid culture w Gram Stain     Status: None   Collection Time: 07/02/21  3:18 PM   Specimen: Pleural Fluid  Result Value Ref Range Status   Specimen Description FLUID PLEURAL  Final   Special Requests NONE  Final   Gram Stain   Final    RARE WBC PRESENT,BOTH PMN AND MONONUCLEAR NO ORGANISMS SEEN    Culture   Final    NO GROWTH 3 DAYS Performed at [facility], [place] [address] [place], [place] [address]     Report Status 07/06/2021 FINAL  Final  Blood Culture (routine x 2)     Status: None (Preliminary result)   Collection Time: 07/07/21  8:25 PM   Specimen: BLOOD  Result Value Ref Range Status   Specimen Description   Final    BLOOD LEFT ANTECUBITAL Performed at [facility] [address] [place], [place] [address]    Special Requests   Final    BOTTLES DRAWN AEROBIC ONLY Blood Culture adequate volume Performed at [facility] [address], [place], [place] [address]    Culture   Final    NO GROWTH 2 DAYS Performed at [facility] [address], [place], [place] [address]    Report Status PENDING  Incomplete  Resp Panel by RT-PCR (Flu A&B, Covid) Nasopharyngeal Swab     Status: None   Collection Time: 07/07/21  8:52 PM   Specimen: Nasopharyngeal Swab; Nasopharyngeal(NP) swabs in vial transport medium  Result Value Ref Range Status   SARS Coronavirus 2 by RT PCR NEGATIVE NEGATIVE Final    Comment: (NOTE) SARS-CoV-2 target nucleic acids are NOT DETECTED.  The SARS-CoV-2 RNA is generally detectable in upper respiratory specimens during the acute phase of infection. The lowest concentration of SARS-CoV-2 viral copies this assay can detect is 138 copies/mL. A negative result does not preclude SARS-Cov-2 infection and should not be used as the sole basis for treatment or other patient management decisions. A negative result may occur with  improper specimen collection/handling, submission of specimen other than nasopharyngeal swab, presence of viral mutation(s) within the areas targeted by this assay, and inadequate number of viral copies(<138 copies/mL). A negative result must be combined with clinical observations, patient  history, and epidemiological information. The expected result is Negative.  Fact Sheet for Patients:  EntrepreneurPulse.com.au  Fact Sheet for Healthcare Providers:   IncredibleEmployment.be  This test is no t yet approved or cleared by the [place] FDA and  has been authorized for detection and/or diagnosis of SARS-CoV-2 by FDA under an Emergency Use Authorization (EUA). This EUA will remain  in effect (meaning this test can be used) for the duration of the COVID-19 declaration under Section 564(b)(1) of the Act, 21 U.S.C.section 360bbb-3(b)(1), unless the authorization is terminated  or revoked sooner.       Influenza A by PCR NEGATIVE NEGATIVE Final   Influenza B by PCR NEGATIVE NEGATIVE Final    Comment: (NOTE) The Xpert Xpress SARS-CoV-2/FLU/RSV plus assay is intended as an aid in the diagnosis of influenza from Nasopharyngeal swab specimens and should not be used as a sole basis for treatment. Nasal washings and aspirates are unacceptable for Xpert Xpress SARS-CoV-2/FLU/RSV testing.  Fact Sheet for Patients: EntrepreneurPulse.com.au  Fact Sheet for Healthcare Providers: IncredibleEmployment.be  This test is not yet approved or cleared by the [place] FDA and has been authorized for detection and/or diagnosis of SARS-CoV-2 by FDA under an Emergency Use Authorization (EUA). This EUA will remain in effect (meaning this test can be used) for the duration of the COVID-19 declaration under Section 564(b)(1) of the Act, 21 U.S.C. section 360bbb-3(b)(1), unless the authorization is terminated or revoked.  Performed at [facility], [place] [address] [place], [place] [address]   Blood Culture (routine x 2)     Status: None (Preliminary result)   Collection Time: 07/08/21  6:19 AM   Specimen: BLOOD LEFT ARM  Result Value Ref Range Status   Specimen Description BLOOD LEFT ARM  Final   Special Requests   Final    BOTTLES DRAWN AEROBIC AND ANAEROBIC Blood Culture adequate volume   Culture   Final    NO GROWTH 2 DAYS Performed at [facility], [address], [place], [place] [address]    Report Status PENDING  Incomplete      Radiology Studies: NM Pulmonary Perfusion  Result Date: 07/08/2021 CLINICAL DATA:  Chest pain, shortness of breath, history of blood clots EXAM: NUCLEAR MEDICINE PERFUSION LUNG SCAN TECHNIQUE: Perfusion images were obtained in multiple projections after intravenous injection of radiopharmaceutical. Ventilation scans intentionally deferred if perfusion scan and chest x-ray adequate for interpretation during COVID 19 epidemic. RADIOPHARMACEUTICALS:  3.91 mCi Tc-62mMAA IV COMPARISON:  Chest radiograph dated 07/07/2021. CTA chest dated 06/30/2021. FINDINGS: Normal perfusion, without wedge-shaped perfusion defects to suggest pulmonary embolism. Corresponding chest radiograph demonstrates patchy opacities in the right mid and lower lungs. IMPRESSION: Negative for pulmonary embolism. Electronically Signed   By: SJulian HyM.D.   On: 07/08/2021 18:23    Scheduled Meds:  [START ON 07/13/2021] apixaban  2.5 mg Oral BID   Chlorhexidine Gluconate Cloth  6 each Topical Q0600   cinacalcet  60 mg Oral QPM   doxycycline  100 mg Oral Q12H   fluticasone furoate-vilanterol  1 puff Inhalation Daily   hydrocortisone   Topical BID   metoprolol tartrate  50 mg Oral BID   midodrine  10 mg Oral Q M,W,F-HD   multivitamin  1 tablet Oral QHS   peg 3350 powder  0.5 kit Oral Once   sevelamer carbonate  800-1,600 mg Oral TID with meals   Continuous Infusions:  cefTRIAXone (ROCEPHIN)  IV 1 g (07/10/21 0012)  LOS: 3 days   Time spent: 37 minutes   Darliss Cheney, MD [facility]  07/10/2021, 12:36 PM  Please page via [place] and do not message via secure chat for anything urgent. Secure chat can be used for anything non urgent.  How to contact the [facility] Attending or Consulting provider [place] or covering provider during after hours [place], for this patient?  Check the care team in [facility] and look for a) attending/consulting [facility]  provider listed and b) the [facility] team listed. Page or secure chat [phone]. Log into [email or web address] and use [place]'s universal password to access. If you do not have the password, please contact the hospital operator. Locate the [facility] provider you are looking for under [facility] and page to a number that you can be directly reached. If you still have difficulty reaching the provider, please page the [facility] (Director on Call) for the Hospitalists listed on amion for assistance.

## 2021-07-10 NOTE — Transfer of Care (Signed)
Immediate Anesthesia Transfer of Care Note  Patient: Thomas Robinson  Procedure(s) Performed: ESOPHAGOGASTRODUODENOSCOPY (EGD) WITH PROPOFOL COLONOSCOPY WITH PROPOFOL BIOPSY  Patient Location: PACU and Endoscopy Unit  Anesthesia Type:MAC  Level of Consciousness: sedated  Airway & Oxygen Therapy: Patient Spontanous Breathing and Patient connected to face mask oxygen  Post-op Assessment: Report given to RN and Post -op Vital signs reviewed and unstable, Anesthesiologist notified. Pt hypotensive in PACU, Phenylephrine and Vasopressin administered with improvement (see PACU flowsheet and MAR for details).   Post vital signs: Reviewed and unstable  Last Vitals:  Vitals Value Taken Time  BP 97/64 07/10/21 1111  Temp    Pulse 75 07/10/21 1112  Resp 18 07/10/21 1112  SpO2 94 % 07/10/21 1112  Vitals shown include unvalidated device data.  Last Pain:  Vitals:   07/09/21 2300  TempSrc: Oral  PainSc:       Patients Stated Pain Goal: 1 (65/53/74 8270)  Complications: As above. Also pt had intermittent a-flutter during case, accompanied with brief hypertension (210's/180's). Hypotension began after pt returned to baseline sinus tach. Dr Bryan Lemma and Dr Gloris Manchester aware.

## 2021-07-11 ENCOUNTER — Encounter (HOSPITAL_COMMUNITY): Payer: Self-pay | Admitting: Gastroenterology

## 2021-07-11 ENCOUNTER — Encounter: Payer: Self-pay | Admitting: Gastroenterology

## 2021-07-11 DIAGNOSIS — K2990 Gastroduodenitis, unspecified, without bleeding: Secondary | ICD-10-CM

## 2021-07-11 LAB — SURGICAL PATHOLOGY

## 2021-07-11 MED ORDER — APIXABAN 2.5 MG PO TABS
2.5000 mg | ORAL_TABLET | Freq: Two times a day (BID) | ORAL | 0 refills | Status: DC
Start: 2021-07-13 — End: 2021-09-13

## 2021-07-11 NOTE — Progress Notes (Signed)
Pt refusing all care at this point, signed another AMA form, pt says he has a friend coming up to help him dress and get him downstairs. MD Dr Doristine Bosworth notified. Pt understands we cannot help him dress or give further treatment now that [place] form is signed

## 2021-07-11 NOTE — Progress Notes (Signed)
Unable to complete a thorough shift assessment due to patient continuously declining care .

## 2021-07-11 NOTE — Progress Notes (Signed)
Patient refusing to have labs drawn

## 2021-07-11 NOTE — Progress Notes (Addendum)
Patient refused to have IV access placed , patient educated on the need for the access to administer antibiotics. Patient continues to refuse at this time , patient stated " I don't care I have been stuck enough leave me the hell alone".Will continue to educate.

## 2021-07-11 NOTE — Discharge Summary (Signed)
Physician Discharge Summary  Gurnoor Ursua KKX:[id number] DOB: 1960/12/08 DOA: 07/07/2021  PCP: [name], PA  Admit date: 07/07/2021 Discharge date: 07/11/2021 30 Day Unplanned Readmission Risk Score    Flowsheet Row ED to Hosp-Admission (Discharged) from 07/07/2021 in [place] [facility] PCU  30 Day Unplanned Readmission Risk Score (%) 35.07 Filed at 07/11/2021 0801       This score is the patient's risk of an unplanned readmission within 30 days of being discharged (0 -100%). The score is based on dignosis, age, lab data, medications, orders, and past utilization.   Low:  0-14.9   Medium: 15-21.9   High: 22-29.9   Extreme: 30 and above          Admitted From: Home Disposition: Left AGAINST MEDICAL ADVICE  Recommendations for Outpatient Follow-up:  Follow up with PCP in 1-2 weeks Please obtain BMP/CBC in one week Please follow up with your PCP on the following pending results: Unresulted Labs (From admission, onward)     Start     Ordered   07/11/21 0500  CBC with Differential/Platelet  Tomorrow morning,   R        07/10/21 1246   07/07/21 2025  Urinalysis, Routine w reflex microscopic  (Septic presentation on arrival (screening labs, nursing and treatment orders for obvious sepsis))  ONCE - STAT,   STAT        07/07/21 2025             Discharge Condition: Poor/guarded CODE STATUS: Full code  Subjective: Patient seen and examined this morning.  I received information from nephrologist that patient was interested in hospice.  TOC was informed.  We were told that it would take at least 1 to 2 days to arrange home hospice however patient's partner did not want him to come home so there were some logistical issues that he was he was still trying to find out.  I was informed that residential hospice will take at least 2 weeks to accommodate him.  I have personally wanted to verify with the patient that he was in fact interested in hospice.  When I broached the  topic, he said yes he was interested in hospice but he was also interested in continuing dialysis.  Not sure if he understood very well when talking to the nephrologist that dialysis will be stopped if he were to sign up for hospice.  He continued to give me runaround when I tried to ask the same question around many times.  He did not give me any answer straightforward.  I then started talking to him about his CODE STATUS as he is currently full code.  He wanted to remain full code.  During my conversation, at multiple times, he kept saying " I just want to go home, let me go to my f.......g home".  I informed him about the consequences of leaving [facility] if he were to do so.  He verbalized understanding.  We ended our conversation there.  I was informed 15 minutes later that patient wanted to sign AMA and leave.  He eventually did so.  Following is brief description of his hospitalization.  Brief/Interim Summary: Glynn Yepes is an 60 y.o. male past medical history significant for chronic hypoxic respiratory failure on 2 L of oxygen, chronic diastolic heart failure end-stage renal disease on hemodialysis Monday Wednesday and Friday, she also has a history of a thoracic aortic aneurysm, atrial flutter status post conversion on Eliquis, essential  hypertension who recently left AGAINST MEDICAL ADVICE on 07/05/2021 during this admission, he underwent thoracocentesis for right-sided pleural effusion which was transudative in the setting of pneumonia and septic shock after at the his house started developing bright red blood per rectum he stopped his Eliquis on his own started getting weak brought into the ED hypoxic, and atrial flutter and encephalopathy placed on 6 L of oxygen.  Hemoglobin of 10 (right about baseline).  FOBT positive, potassium 5, sodium 132 CO2 28 creatinine up to 7 (on the day he left AMA was 5) chest x-ray showed a small right-sided pleural effusion and resolving consolidation  with started empirically on doxycycline and cefepime.  He was admitted with the following problems.   Acute on chronic respiratory failure with hypoxia of unclear etiology: Question right-sided pneumonia.  He did complete a 6-day course of antibiotics before he left AGAINST MEDICAL ADVICE. PFTs showed fixed severe obstruction in the past. VQ scan was negative for chronic PEs. He was continued on doxycycline and cefepime for the duration that he was here at this time.  He was on 2 L of oxygen at the time he left.  Due to some concerns of possible interstitial lung disease causing hypoxia, high-resolution CT chest was ordered however patient refused that as he refused every other care.   Hematochezia /anemia of chronic disease/gastritis/internal hemorrhoids: He was on Eliquis for atrial flutter which he stopped prior to coming to admission.  He relates bright red blood per rectum. FOBT was positive.  GI consulted.  He underwent EGD and colonoscopy 07/10/2021 and was found to have internal hemorrhoids, some barotrauma, gastritis but no active bleeding.  GI recommended continuing Protonix 40 mg p.o. twice daily for 6 weeks resuming Eliquis on 07/13/2021.   Nausea and vomiting: No symptoms today.   Typical Atrial flutter: Now back in atrial flutter cardiology was consulted during his last admission there was a discussion of about TEE cardioversion once he had recovered from his acute illness. Cardiology recommended TEE cardioversion when he is cleared from a GI standpoint due to his hematochezia.  However cardiology did not entertain that idea while inpatient and recommended outpatient follow-up with Dr. Jorene Minors in 2 to 4 weeks for consideration of outpatient TEE/DCCV.   High anion gap metabolic acidosis: Per nephrology   Chronic diastolic heart failure: Appears euvolemic. Continue further management per HD.   Sick euthyroid syndrome: With initiation of 6.6 Free T4 unremarkable need to be rechecked in  6 weeks.   Elevated LFTs: On his last admission it was greater than 2000 probably due to septic shock, LFTs improving.   End-stage renal disease on hemodialysis: Patient left AGAINST MEDICAL ADVICE.   Essential hypertension: Blood pressure on the low side, Norvasc and hydralazine stopped.  Discharge Diagnoses:  Principal Problem:   Acute on chronic respiratory failure with hypoxia (HCC) Active Problems:   Metabolic acidosis   ESRD on dialysis ([place])   Chronic diastolic heart failure (HCC)   Anemia in ESRD (end-stage renal disease) (HCC)   Atrial flutter (HCC)   Decubitus ulcer of coccyx, stage 2 (HCC)   Prolonged QT interval   Hematochezia   Vomiting   Gastritis and gastroduodenitis   Grade III hemorrhoids    Discharge Instructions   Allergies as of 07/11/2021       Reactions   Vancomycin Shortness Of Breath, Itching        Medication List     STOP taking these medications    amLODipine 10 MG tablet  Commonly known as: NORVASC   amoxicillin-clavulanate 500-125 MG tablet Commonly known as: AUGMENTIN   hydrALAZINE 25 MG tablet Commonly known as: APRESOLINE       TAKE these medications    acetaminophen 500 MG tablet Commonly known as: TYLENOL Take 1,000 mg by mouth every 6 (six) hours as needed for mild pain, moderate pain or headache.   albuterol 108 (90 Base) MCG/ACT inhaler Commonly known as: VENTOLIN HFA Inhale 2 puffs into the lungs 2 (two) times daily as needed for shortness of breath or wheezing.   apixaban 2.5 MG Tabs tablet Commonly known as: ELIQUIS Take 1 tablet (2.5 mg total) by mouth 2 (two) times daily. Start taking on: July 13, 2021 What changed: These instructions start on July 13, 2021. If you are unsure what to do until then, ask your doctor or other care provider.   Auryxia 1 GM 210 MG(Fe) tablet Generic drug: ferric citrate Take 210 mg by mouth See admin instructions. Take one tablet (210 mg) by mouth occasionally    Breo Ellipta 100-25 MCG/ACT Aepb Generic drug: fluticasone furoate-vilanterol Inhale 1 puff into the lungs daily. What changed:  when to take this reasons to take this   cinacalcet 60 MG tablet Commonly known as: SENSIPAR Take 1 tablet (60 mg total) by mouth every evening.   furosemide 80 MG tablet Commonly known as: LASIX Take 1 tablet (80 mg total) by mouth daily as needed for fluid (Use only on non-HD days (Tues/Thur/Sat/Sun)). What changed: when to take this   metoprolol tartrate 50 MG tablet Commonly known as: LOPRESSOR Take 1 tablet (50 mg total) by mouth 2 (two) times daily.   midodrine 10 MG tablet Commonly known as: PROAMATINE Take 1 tablet (10 mg total) by mouth 3 (three) times daily with meals. What changed: when to take this   multivitamin Tabs tablet Take 1 tablet by mouth at bedtime.   sevelamer carbonate 800 MG tablet Commonly known as: RENVELA Take 800-1,600 mg by mouth See admin instructions. Take 1-2 tablets [phone] mg) by mouth three times daily with meals, take 1 tablet (800 mg) with snacks        Follow-up Information     Tobb, Kardie, DO Follow up on 07/30/2021.   Specialty: Cardiology Why: at 9:00 AM Contact information: [address] [place] [address] [phone]                Allergies  Allergen Reactions   Vancomycin Shortness Of Breath and Itching    Consultations: Cardiology, nephrology, GI   Procedures/Studies: DG Chest 2 View  Result Date: 06/29/2021 CLINICAL DATA:  Shortness of breath.  Left-sided chest pain.  Cough EXAM: CHEST - 2 VIEW COMPARISON:  Radiograph 05/31/2019 FINDINGS: Right-sided dialysis catheter remains in place. Cardiomegaly again seen. Increase in bilateral pleural effusions, right greater than left. Increased fluid in the right minor fissure. Is peribronchial and interstitial thickening likely represents pulmonary edema. There is no pneumothorax. IMPRESSION: 1. Pulmonary edema and  pleural effusions, increased since prior exam. Findings suggest CHF. 2. Cardiomegaly is unchanged. Electronically Signed   By: Keith Rake M.D.   On: 06/29/2021 22:55   DG Abd 1 View  Result Date: 07/01/2021 CLINICAL DATA:  Vomiting EXAM: ABDOMEN - 1 VIEW COMPARISON:  None. FINDINGS: Bowel-gas pattern appears within normal limits. No abnormally distended gas-filled loops of bowel to suggest obstruction. No suspicious calcifications identified. IMPRESSION: No acute process identified. Electronically Signed   By: Ofilia Neas M.D.   On:  07/01/2021 12:58   CT HEAD WO CONTRAST (5MM)  Result Date: 07/01/2021 CLINICAL DATA:  Seizure, abnormal neuro exam. EXAM: CT HEAD WITHOUT CONTRAST TECHNIQUE: Contiguous axial images were obtained from the base of the skull through the vertex without intravenous contrast. COMPARISON:  CT head 05/30/2021.  MRI brain 04/09/2015. FINDINGS: Brain: There has been recent contrast administration. No evidence of acute infarction, hemorrhage, hydrocephalus, extra-axial collection or mass lesion/mass effect. Again seen is mild diffuse atrophy and mild periventricular white matter hypodensity, likely chronic small vessel ischemic change. Vascular: Atherosclerotic calcifications are present within the cavernous internal carotid arteries. Skull: Normal. Negative for fracture or focal lesion. Sinuses/Orbits: No acute finding. Other: None. IMPRESSION: No acute intracranial abnormality. Electronically Signed   By: Ronney Asters M.D.   On: 07/01/2021 22:17   CT Angio Chest PE W/Cm &/Or Wo Cm  Result Date: 06/30/2021 CLINICAL DATA:  Chest pain or SOB, pleurisy or effusion suspected. Chest pain, dyspnea, cough. Hypoxia. EXAM: CT ANGIOGRAPHY CHEST WITH CONTRAST TECHNIQUE: Multidetector CT imaging of the chest was performed using the standard protocol during bolus administration of intravenous contrast. Multiplanar CT image reconstructions and MIPs were obtained to evaluate the  vascular anatomy. CONTRAST:  9mL OMNIPAQUE IOHEXOL 350 MG/ML SOLN COMPARISON:  None. FINDINGS: Cardiovascular: Adequate opacification of the pulmonary arterial tree. No intraluminal filling defect identified to suggest acute pulmonary embolism. Central pulmonary arteries are of normal caliber. Extensive multi-vessel coronary artery calcification. Mild global cardiomegaly with left ventricular hypertrophy noted. Small pericardial effusion present. Mild atherosclerotic calcification within the thoracic aorta. The thoracic aorta is dilated in its ascending segment measuring 4.2 cm in greatest dimension. The descending thoracic aorta is of normal caliber. Right internal jugular hemodialysis catheter tip noted within the superior cavoatrial junction. Mediastinum/Nodes: Visualized thyroid is unremarkable. No pathologic thoracic adenopathy. Esophagus is unremarkable. Lungs/Pleura: Moderate right and trace left pleural effusions are present. There is diffuse ground-glass pulmonary infiltrate and smooth interlobular septal thickening in keeping with probable diffuse alveolar and pulmonary interstitial edema. Widespread pneumonic or inflammatory infiltrate is possible but considered less likely. There is, however, collapse and consolidation of the right lower lobe. There is a superimposed rounded region of hypoenhancement within the a right lower lobe, best seen on image # 112/4, suspicious for a a region of parenchymal necrosis in the setting of necrotizing pneumonia or, less likely, a acute to subacute pulmonary infarct. This measures 4.0 x 5.0 cm on axial image # 112/4. no pneumothorax. No central obstructing lesion peer Upper Abdomen: Marked atrophy of the visualized left kidney in keeping with changes of chronic renal insufficiency. No acute abnormality. Musculoskeletal: No acute bone abnormality. No lytic or blastic bone lesion. Review of the MIP images confirms the above findings. IMPRESSION: Collapse and  consolidation of the right lower lobe in keeping with changes of lobar pneumonia, with superimposed area of hypoenhancement suspicious for an area of parenchymal necrosis. Less likely, this may represent a acute to subacute infarct. No pulmonary embolism, however, is identified. Superimposed diffuse ground-glass pulmonary infiltrate most suggestive of diffuse alveolar pulmonary edema. Moderate right pleural effusion is nonspecific and may relate to volume overload or represent a parapneumonic effusion. Mild cardiomegaly with left ventricular hypertrophy. Extensive coronary artery calcification. Small pericardial effusion. Dilation of the ascending aorta with maximal dimension of 4.2 cm. Recommend annual imaging followup by CTA or MRA. This recommendation follows 2010 ACCF/AHA/AATS/ACR/ASA/SCA/SCAI/SIR/STS/SVM Guidelines for the Diagnosis and Management of Patients with Thoracic Aortic Disease. Circulation. 2010; 121: N462-V035. Aortic aneurysm NOS (ICD10-I71.9) Electronically Signed  By: Fidela Salisbury M.D.   On: 06/30/2021 01:36   NM Pulmonary Perfusion  Result Date: 07/08/2021 CLINICAL DATA:  Chest pain, shortness of breath, history of blood clots EXAM: NUCLEAR MEDICINE PERFUSION LUNG SCAN TECHNIQUE: Perfusion images were obtained in multiple projections after intravenous injection of radiopharmaceutical. Ventilation scans intentionally deferred if perfusion scan and chest x-ray adequate for interpretation during COVID 19 epidemic. RADIOPHARMACEUTICALS:  3.91 mCi Tc-41m MAA IV COMPARISON:  Chest radiograph dated 07/07/2021. CTA chest dated 06/30/2021. FINDINGS: Normal perfusion, without wedge-shaped perfusion defects to suggest pulmonary embolism. Corresponding chest radiograph demonstrates patchy opacities in the right mid and lower lungs. IMPRESSION: Negative for pulmonary embolism. Electronically Signed   By: Julian Hy M.D.   On: 07/08/2021 18:23   DG Chest Port 1 View  Result Date:  07/07/2021 CLINICAL DATA:  Altered mental status x2 days, questionable sepsis. EXAM: PORTABLE CHEST 1 VIEW COMPARISON:  Chest radiograph July 02, 2021 FINDINGS: Dual lumen right chest wall dialysis catheter with tip overlying the superior cavoatrial junction. The heart size and mediastinal contours are stable. Similar size of the small right pleural effusion. Interval increased aeration of the right lower lobe of a decrease in the right basilar consolidation. No new focal consolidation. The visualized skeletal structures are unchanged. IMPRESSION: 1. Similar size of the small right pleural effusion with interval improved aeration of the right lower lobe and decreased in the right basilar consolidation. No new focal consolidation. Electronically Signed   By: Dahlia Bailiff M.D.   On: 07/07/2021 20:45   DG CHEST PORT 1 VIEW  Result Date: 07/02/2021 CLINICAL DATA:  Status post right thoracentesis. EXAM: PORTABLE CHEST 1 VIEW COMPARISON:  07/02/2021 FINDINGS: There is a right chest wall dialysis catheter with tips in the cavoatrial junction. Heart size and mediastinal contours are stable. Decreased volume of the right pleural effusion status post thoracentesis. No pneumothorax identified. Atelectasis is identified within the right lung base. Similar appearance of mild bilateral hazy lung opacities throughout both lungs. IMPRESSION: 1. Decrease in volume of right pleural effusion status post thoracentesis. 2. No pneumothorax. Electronically Signed   By: Kerby Moors M.D.   On: 07/02/2021 15:44   DG CHEST PORT 1 VIEW  Result Date: 07/02/2021 CLINICAL DATA:  Weakness, pulmonary edema EXAM: PORTABLE CHEST 1 VIEW COMPARISON:  Portable exam 0838 hours compared to 06/29/2021 FINDINGS: RIGHT jugular dual lumen central venous catheter with tip projecting over SVC above cavoatrial junction. Enlargement of cardiac silhouette with pulmonary vascular congestion. Diffuse infiltrates consistent with pulmonary edema.  Moderate RIGHT pleural effusion. No pneumothorax. Atherosclerotic calcification aorta. Bones demineralized with prior cervical spine fusion. IMPRESSION: Pulmonary infiltrate with increased RIGHT pleural effusion since previous exam. Aortic Atherosclerosis (ICD10-I70.0). Electronically Signed   By: Lavonia Dana M.D.   On: 07/02/2021 10:21   EEG adult  Result Date: 07/01/2021 Lora Havens, MD     07/01/2021  8:53 PM Patient Name: Santo Zahradnik MRN: [id number] Epilepsy Attending: Lora Havens Referring Physician/Provider: Dr Susie Cassette Date: 07/01/2021 Duration: 24.30 mins Patient history: 60yo M patient was noted to seizure-like activity, he was found unresponsive after generalized tonic-clonic shaking.  Seizure activity stopped spontaneously after 1 minute. Post seizure patient was noted to be confused, hypoxic. EEG to evaluate for seizure. Level of alertness: Awake, asleep AEDs during EEG study: LEV Technical aspects: This EEG study was done with scalp electrodes positioned according to the 10-20 International system of electrode placement. Electrical activity was acquired at a sampling rate of 500Hz  and  reviewed with a high frequency filter of 70Hz  and a low frequency filter of 1Hz . EEG data were recorded continuously and digitally stored. Description: No posterior dominant rhythm was seen. Sleep was characterized by sleep spindles (12 to 14 Hz), maximal frontocentral region. EEG showed continuous generalized 3 to 6 Hz theta-delta slowing. Hyperventilation and photic stimulation were not performed.   ABNORMALITY - Continuous slow, generalized IMPRESSION: This study is suggestive of moderate diffuse encephalopathy, nonspecific etiology. No seizures or epileptiform discharges were seen throughout the recording. Lora Havens   ECHOCARDIOGRAM COMPLETE  Result Date: 07/02/2021    ECHOCARDIOGRAM REPORT   Patient Name:   VIRGEL HARO Date of Exam: 07/02/2021 Medical Rec #:  [id number]         Height:       68.0 in Accession #:    [id number]       Weight:       130.1 lb Date of Birth:  February 18, 1961        BSA:          1.702 m Patient Age:    43 years         BP:           125/48 mmHg Patient Gender: M                HR:           119 bpm. Exam Location:  Inpatient Procedure: 2D Echo, Cardiac Doppler and Color Doppler Indications:    CHF  History:        Patient has prior history of Echocardiogram examinations, most                 recent 01/13/2021. CHF, Pulmonary HTN, Aortic Valve Disease,                 Signs/Symptoms:Hypotension; Risk Factors:Hypertension. ESRD.  Sonographer:    Merrie Roof RDCS Referring Phys: [phone] [facility]  Sonographer Comments: Global longitudinal strain was attempted. The patient could no longer tolerate the exam. Strain attempted, before told to stop. IMPRESSIONS  1. Strain attempted but patient asked to stop. Would recomm limited echo with strain only.  2. LV cavitiy is small. Turburlent flow through LV. Near caviity obliteration. . Left ventricular ejection fraction, by estimation, is 70 to 75%. The left ventricle has hyperdynamic function. The left ventricle has no regional wall motion abnormalities.  There is severe concentric left ventricular hypertrophy. Left ventricular diastolic function could not be evaluated.  3. Right ventricular systolic function is moderately reduced. The right ventricular size is mildly enlarged. Mildly increased right ventricular wall thickness.  4. Left atrial size was severely dilated.  5. Right atrial size was severely dilated.  6. Mild mitral valve regurgitation. Moderate to severe mitral annular calcification.  7. Tricuspid valve regurgitation is moderate.  8. AV is thickened with restricted motion. Peak and mean gradients through the valve are 22 and 12 mm hg respectively. Dimensionless gradient is 0.53 consistent with mild AS 2 D imaging suggests that it is moderate. No significant change in gradient from May 2022. Aortic valve  regurgitation is mild.  9. Aortic dilatation noted. There is mild dilatation of the ascending aorta, measuring 40 mm. FINDINGS  Left Ventricle: LV cavitiy is small. Turburlent flow through LV. Near caviity obliteration. Left ventricular ejection fraction, by estimation, is 70 to 75%. The left ventricle has hyperdynamic function. The left ventricle has no regional wall motion abnormalities. The left ventricular internal cavity size was  small. There is severe concentric left ventricular hypertrophy. Left ventricular diastolic function could not be evaluated. Right Ventricle: The right ventricular size is mildly enlarged. Mildly increased right ventricular wall thickness. Right ventricular systolic function is moderately reduced. Left Atrium: Left atrial size was severely dilated. Right Atrium: Right atrial size was severely dilated. Pericardium: Trivial pericardial effusion is present. Mitral Valve: There is mild thickening of the mitral valve leaflet(s). Moderate to severe mitral annular calcification. Mild mitral valve regurgitation. Tricuspid Valve: The tricuspid valve is grossly normal. Tricuspid valve regurgitation is moderate. Aortic Valve: AV is thickened with restricted motion. Peak and mean gradients through the valve are 22 and 12 mm hg respectively. Dimensionless gradient is 0.53 consistent with mild AS 2 D imaging suggests that it is moderate. No significant change in gradient from May 2022. Aortic valve regurgitation is mild. Aortic valve mean gradient measures 12.0 mmHg. Aortic valve peak gradient measures 21.7 mmHg. Pulmonic Valve: The pulmonic valve was not well visualized. Pulmonic valve regurgitation is mild. Aorta: Aortic dilatation noted. There is mild dilatation of the ascending aorta, measuring 40 mm. IAS/Shunts: No atrial level shunt detected by color flow Doppler.  LEFT VENTRICLE PLAX 2D LVIDd:         2.90 cm LVIDs:         1.70 cm LV PW:         1.70 cm LV IVS:        1.70 cm  RIGHT VENTRICLE  RV Basal diam:  4.50 cm RV Mid diam:    3.70 cm RV S prime:     11.10 cm/s TAPSE (M-mode): 1.3 cm LEFT ATRIUM              Index        RIGHT ATRIUM           Index LA diam:        3.50 cm  2.06 cm/m   RA Area:     28.60 cm LA Vol (A2C):   139.0 ml 81.66 ml/m  RA Volume:   99.30 ml  58.34 ml/m LA Vol (A4C):   75.6 ml  44.41 ml/m LA Biplane Vol: 103.0 ml 60.51 ml/m  AORTIC VALVE AV Vmax:           233.00 cm/s AV Vmean:          159.000 cm/s AV VTI:            0.304 m AV Peak Grad:      21.7 mmHg AV Mean Grad:      12.0 mmHg LVOT Vmax:         128.00 cm/s LVOT Vmean:        80.800 cm/s LVOT VTI:          0.162 m LVOT/AV VTI ratio: 0.53  AORTA Ao Root diam: 3.50 cm Ao Asc diam:  4.00 cm TRICUSPID VALVE TR Peak grad:   74.6 mmHg TR Vmax:        432.00 cm/s  SHUNTS Systemic VTI: 0.16 m Dorris Carnes MD Electronically signed by Dorris Carnes MD Signature Date/Time: 07/02/2021/7:51:14 PM    Final    VAS [place] LOWER EXTREMITY VENOUS (DVT)  Result Date: 07/01/2021  Lower Venous DVT Study Patient Name:  HEITOR STEINHOFF  Date of Exam:   07/01/2021 Medical Rec #: [id number]         Accession #:    [id number] Date of Birth: [birth year]         Patient Gender: Jerilynn Mages  Patient Age:   68 years Exam Location:  [facility] Procedure:      VAS [place] LOWER EXTREMITY VENOUS (DVT) Referring Phys: MURALI RAMASWAMY --------------------------------------------------------------------------------  Indications: Edema.  Risk Factors: None identified. Comparison Study: No prior studies. Performing Technologist: Oliver Hum RVT  Examination Guidelines: A complete evaluation includes B-mode imaging, spectral Doppler, color Doppler, and power Doppler as needed of all accessible portions of each vessel. Bilateral testing is considered an integral part of a complete examination. Limited examinations for reoccurring indications may be performed as noted. The reflux portion of the exam is performed with the patient in reverse Trendelenburg.   +---------+---------------+---------+-----------+----------+--------------+ RIGHT    CompressibilityPhasicitySpontaneityPropertiesThrombus Aging +---------+---------------+---------+-----------+----------+--------------+ CFV      Full           Yes      No                                  +---------+---------------+---------+-----------+----------+--------------+ SFJ      Full                                                        +---------+---------------+---------+-----------+----------+--------------+ FV Prox  Full                                                        +---------+---------------+---------+-----------+----------+--------------+ FV Mid   Full                                                        +---------+---------------+---------+-----------+----------+--------------+ FV DistalFull                                                        +---------+---------------+---------+-----------+----------+--------------+ PFV      Full                                                        +---------+---------------+---------+-----------+----------+--------------+ POP      Full           Yes      No                                  +---------+---------------+---------+-----------+----------+--------------+ PTV      Full                                                        +---------+---------------+---------+-----------+----------+--------------+  PERO     Full                                                        +---------+---------------+---------+-----------+----------+--------------+   +---------+---------------+---------+-----------+----------+--------------+ LEFT     CompressibilityPhasicitySpontaneityPropertiesThrombus Aging +---------+---------------+---------+-----------+----------+--------------+ CFV      Full           Yes      No                                   +---------+---------------+---------+-----------+----------+--------------+ SFJ      Full                                                        +---------+---------------+---------+-----------+----------+--------------+ FV Prox  Full                                                        +---------+---------------+---------+-----------+----------+--------------+ FV Mid   Full                                                        +---------+---------------+---------+-----------+----------+--------------+ FV DistalFull                                                        +---------+---------------+---------+-----------+----------+--------------+ PFV      Full                                                        +---------+---------------+---------+-----------+----------+--------------+ POP      Full           Yes      No                                  +---------+---------------+---------+-----------+----------+--------------+ PTV      Full                                                        +---------+---------------+---------+-----------+----------+--------------+ PERO     Full                                                        +---------+---------------+---------+-----------+----------+--------------+  Summary: RIGHT: - There is no evidence of deep vein thrombosis in the lower extremity.  - No cystic structure found in the popliteal fossa.  LEFT: - There is no evidence of deep vein thrombosis in the lower extremity.  - No cystic structure found in the popliteal fossa.  *See table(s) above for measurements and observations. Electronically signed by Orlie Pollen on 07/01/2021 at 2:27:07 PM.    Final    US Abdomen Limited RUQ (LIVER/GB)  Result Date: 07/04/2021 CLINICAL DATA:  Transaminitis. EXAM: ULTRASOUND ABDOMEN LIMITED RIGHT UPPER QUADRANT COMPARISON:  April 09, 2015 FINDINGS: Gallbladder: A moderate to marked amount of heterogeneous  echogenic sludge is noted within the gallbladder lumen. No gallstones are identified. The gallbladder wall measures approximately 3.7 mm in thickness. No sonographic Murphy sign noted by sonographer. Common bile duct: Diameter: 2.4 mm Liver: No focal lesion identified. Diffusely increased echogenicity of the liver parenchyma is noted. Portal vein is patent on color Doppler imaging with normal direction of blood flow towards the liver. Other: A trace amount of right upper quadrant ascites is seen. Of incidental note is the presence of a right-sided pleural effusion. IMPRESSION: 1. Gallbladder sludge without evidence of cholelithiasis. 2. Hepatic steatosis. 3. Trace amount of right upper quadrant ascites. 4. Right pleural effusion. Electronically Signed   By: Virgina Norfolk M.D.   On: 07/04/2021 19:54     Discharge Exam: Vitals:   07/11/21 0000 07/11/21 0348  BP: 92/67 106/69  Pulse: (!) 121   Resp: 15 (!) 22  Temp: 98 F (36.7 C) 97.8 F (36.6 C)  SpO2: 99% 91%   Vitals:   07/10/21 2258 07/11/21 0000 07/11/21 0054 07/11/21 0348  BP: 116/79 92/67  106/69  Pulse: (!) 120 (!) 121    Resp: 20 15  (!) 22  Temp: 98 F (36.7 C) 98 F (36.7 C)  97.8 F (36.6 C)  TempSrc:    Oral  SpO2: 100% 99%  91%  Weight:   57.6 kg   Height:        General: Pt is alert, awake, not in acute distress Cardiovascular: RRR, S1/S2 +, no rubs, no gallops Respiratory: CTA bilaterally, no wheezing, no rhonchi Abdominal: Soft, NT, ND, bowel sounds + Extremities: no edema, no cyanosis    The results of significant diagnostics from this hospitalization (including imaging, microbiology, ancillary and laboratory) are listed below for reference.     Microbiology: Recent Results (from the past 240 hour(s))  Pleural fluid culture w Gram Stain     Status: None   Collection Time: 07/02/21  3:18 PM   Specimen: Pleural Fluid  Result Value Ref Range Status   Specimen Description FLUID PLEURAL  Final   Special  Requests NONE  Final   Gram Stain   Final    RARE WBC PRESENT,BOTH PMN AND MONONUCLEAR NO ORGANISMS SEEN    Culture   Final    NO GROWTH 3 DAYS Performed at [facility], [address], [place], [place] [address]    Report Status 07/06/2021 FINAL  Final  Blood Culture (routine x 2)     Status: None (Preliminary result)   Collection Time: 07/07/21  8:25 PM   Specimen: BLOOD  Result Value Ref Range Status   Specimen Description   Final    BLOOD LEFT ANTECUBITAL Performed at [facility] [address] [place], [place] [address]    Special Requests   Final    BOTTLES DRAWN AEROBIC ONLY Blood Culture  adequate volume Performed at [facility] [address], [place], [place] [address]    Culture   Final    NO GROWTH 3 DAYS Performed at [facility], [place] [address] [place], [place] [address]    Report Status PENDING  Incomplete  Resp Panel by RT-PCR (Flu A&B, Covid) Nasopharyngeal Swab     Status: None   Collection Time: 07/07/21  8:52 PM   Specimen: Nasopharyngeal Swab; Nasopharyngeal(NP) swabs in vial transport medium  Result Value Ref Range Status   SARS Coronavirus 2 by RT PCR NEGATIVE NEGATIVE Final    Comment: (NOTE) SARS-CoV-2 target nucleic acids are NOT DETECTED.  The SARS-CoV-2 RNA is generally detectable in upper respiratory specimens during the acute phase of infection. The lowest concentration of SARS-CoV-2 viral copies this assay can detect is 138 copies/mL. A negative result does not preclude SARS-Cov-2 infection and should not be used as the sole basis for treatment or other patient management decisions. A negative result may occur with  improper specimen collection/handling, submission of specimen other than nasopharyngeal swab, presence of viral mutation(s) within the areas targeted by this assay, and inadequate number of viral copies(<138 copies/mL). A negative result must be combined with clinical  observations, patient history, and epidemiological information. The expected result is Negative.  Fact Sheet for Patients:  EntrepreneurPulse.com.au  Fact Sheet for Healthcare Providers:  IncredibleEmployment.be  This test is no t yet approved or cleared by the [place] FDA and  has been authorized for detection and/or diagnosis of SARS-CoV-2 by FDA under an Emergency Use Authorization (EUA). This EUA will remain  in effect (meaning this test can be used) for the duration of the COVID-19 declaration under Section 564(b)(1) of the Act, 21 U.S.C.section 360bbb-3(b)(1), unless the authorization is terminated  or revoked sooner.       Influenza A by PCR NEGATIVE NEGATIVE Final   Influenza B by PCR NEGATIVE NEGATIVE Final    Comment: (NOTE) The Xpert Xpress SARS-CoV-2/FLU/RSV plus assay is intended as an aid in the diagnosis of influenza from Nasopharyngeal swab specimens and should not be used as a sole basis for treatment. Nasal washings and aspirates are unacceptable for Xpert Xpress SARS-CoV-2/FLU/RSV testing.  Fact Sheet for Patients: EntrepreneurPulse.com.au  Fact Sheet for Healthcare Providers: IncredibleEmployment.be  This test is not yet approved or cleared by the [place] FDA and has been authorized for detection and/or diagnosis of SARS-CoV-2 by FDA under an Emergency Use Authorization (EUA). This EUA will remain in effect (meaning this test can be used) for the duration of the COVID-19 declaration under Section 564(b)(1) of the Act, 21 U.S.C. section 360bbb-3(b)(1), unless the authorization is terminated or revoked.  Performed at [facility], [place] [address], [place], [place] [address]   Blood Culture (routine x 2)     Status: None (Preliminary result)   Collection Time: 07/08/21  6:19 AM   Specimen: BLOOD LEFT ARM  Result Value Ref Range Status   Specimen  Description BLOOD LEFT ARM  Final   Special Requests   Final    BOTTLES DRAWN AEROBIC AND ANAEROBIC Blood Culture adequate volume   Culture   Final    NO GROWTH 3 DAYS Performed at [facility], [address], [place],  [address]    Report Status PENDING  Incomplete     Labs: BNP (last 3 results) Recent Labs    02/18/21 0107 03/29/21 0205 07/08/21 0608  BNP 4,369.1* >4,500.0* >4,500.0*  Basic Metabolic Panel: Recent Labs  Lab 07/05/21 0614 07/07/21 2025 07/08/21 0608  NA 132* 132* 134*  K 3.9 5.1 3.8  CL 96* 96* 98  CO2 22 20* 18*  GLUCOSE 104* 101* 90  BUN 32* 58* 56*  CREATININE 4.97* 7.79* 8.29*  CALCIUM 8.7* 8.7* 8.7*   Liver Function Tests: Recent Labs  Lab 07/05/21 0614 07/07/21 2025  AST 309* 102*  ALT 888* 496*  ALKPHOS 118 159*  BILITOT 1.2 1.5*  PROT 5.9* 6.8  ALBUMIN 2.4* 3.1*   No results for input(s): LIPASE, AMYLASE in the last 168 hours. No results for input(s): AMMONIA in the last 168 hours. CBC: Recent Labs  Lab 07/07/21 2025 07/08/21 0608  WBC 7.2 8.7  HGB 10.0* 9.3*  HCT 31.0* 28.8*  MCV 104.7* 100.7*  PLT 123* 105*   Cardiac Enzymes: No results for input(s): CKTOTAL, CKMB, CKMBINDEX, TROPONINI in the last 168 hours. BNP: Invalid input(s): POCBNP CBG: Recent Labs  Lab 07/04/21 2336 07/05/21 0423 07/05/21 0814 07/05/21 1209 07/08/21 2345  GLUCAP 103* 124* 84 97 109*   D-Dimer No results for input(s): DDIMER in the last 72 hours. Hgb A1c No results for input(s): HGBA1C in the last 72 hours. Lipid Profile No results for input(s): CHOL, HDL, LDLCALC, TRIG, CHOLHDL, LDLDIRECT in the last 72 hours. Thyroid function studies No results for input(s): TSH, T4TOTAL, T3FREE, THYROIDAB in the last 72 hours.  Invalid input(s): FREET3 Anemia work up No results for input(s): VITAMINB12, FOLATE, FERRITIN, TIBC, IRON, RETICCTPCT in the last 72 hours. Urinalysis    Component Value Date/Time   COLORURINE YELLOW  05/24/2017 1729   APPEARANCEUR HAZY (A) 05/24/2017 1729   LABSPEC 1.011 05/24/2017 1729   PHURINE 5.0 05/24/2017 1729   GLUCOSEU 50 (A) 05/24/2017 1729   HGBUR SMALL (A) 05/24/2017 1729   BILIRUBINUR NEGATIVE 05/24/2017 1729   KETONESUR NEGATIVE 05/24/2017 1729   PROTEINUR 100 (A) 05/24/2017 1729   UROBILINOGEN 0.2 04/09/2015 1527   NITRITE NEGATIVE 05/24/2017 1729   LEUKOCYTESUR TRACE (A) 05/24/2017 1729   Sepsis Labs Invalid input(s): PROCALCITONIN,  WBC,  LACTICIDVEN Microbiology Recent Results (from the past 240 hour(s))  Pleural fluid culture w Gram Stain     Status: None   Collection Time: 07/02/21  3:18 PM   Specimen: Pleural Fluid  Result Value Ref Range Status   Specimen Description FLUID PLEURAL  Final   Special Requests NONE  Final   Gram Stain   Final    RARE WBC PRESENT,BOTH PMN AND MONONUCLEAR NO ORGANISMS SEEN    Culture   Final    NO GROWTH 3 DAYS Performed at [facility], [address], [place], [place] [address]    Report Status 07/06/2021 FINAL  Final  Blood Culture (routine x 2)     Status: None (Preliminary result)   Collection Time: 07/07/21  8:25 PM   Specimen: BLOOD  Result Value Ref Range Status   Specimen Description   Final    BLOOD LEFT ANTECUBITAL Performed at [facility] [address], [place], [place] [address]    Special Requests   Final    BOTTLES DRAWN AEROBIC ONLY Blood Culture adequate volume Performed at [facility] [address], [place], [place] [address]    Culture   Final    NO GROWTH 3 DAYS Performed at [facility], [place] [address] [place], [place] [address]    Report Status PENDING  Incomplete  Resp Panel by  RT-PCR (Flu A&B, Covid) Nasopharyngeal Swab     Status: None   Collection Time: 07/07/21  8:52 PM   Specimen: Nasopharyngeal Swab; Nasopharyngeal(NP) swabs in vial transport medium  Result Value Ref Range Status   SARS Coronavirus 2 by RT PCR NEGATIVE NEGATIVE  Final    Comment: (NOTE) SARS-CoV-2 target nucleic acids are NOT DETECTED.  The SARS-CoV-2 RNA is generally detectable in upper respiratory specimens during the acute phase of infection. The lowest concentration of SARS-CoV-2 viral copies this assay can detect is 138 copies/mL. A negative result does not preclude SARS-Cov-2 infection and should not be used as the sole basis for treatment or other patient management decisions. A negative result may occur with  improper specimen collection/handling, submission of specimen other than nasopharyngeal swab, presence of viral mutation(s) within the areas targeted by this assay, and inadequate number of viral copies(<138 copies/mL). A negative result must be combined with clinical observations, patient history, and epidemiological information. The expected result is Negative.  Fact Sheet for Patients:  EntrepreneurPulse.com.au  Fact Sheet for Healthcare Providers:  IncredibleEmployment.be  This test is no t yet approved or cleared by the [place] FDA and  has been authorized for detection and/or diagnosis of SARS-CoV-2 by FDA under an Emergency Use Authorization (EUA). This EUA will remain  in effect (meaning this test can be used) for the duration of the COVID-19 declaration under Section 564(b)(1) of the Act, 21 U.S.C.section 360bbb-3(b)(1), unless the authorization is terminated  or revoked sooner.       Influenza A by PCR NEGATIVE NEGATIVE Final   Influenza B by PCR NEGATIVE NEGATIVE Final    Comment: (NOTE) The Xpert Xpress SARS-CoV-2/FLU/RSV plus assay is intended as an aid in the diagnosis of influenza from Nasopharyngeal swab specimens and should not be used as a sole basis for treatment. Nasal washings and aspirates are unacceptable for Xpert Xpress SARS-CoV-2/FLU/RSV testing.  Fact Sheet for Patients: EntrepreneurPulse.com.au  Fact Sheet for Healthcare  Providers: IncredibleEmployment.be  This test is not yet approved or cleared by the [place] FDA and has been authorized for detection and/or diagnosis of SARS-CoV-2 by FDA under an Emergency Use Authorization (EUA). This EUA will remain in effect (meaning this test can be used) for the duration of the COVID-19 declaration under Section 564(b)(1) of the Act, 21 U.S.C. section 360bbb-3(b)(1), unless the authorization is terminated or revoked.  Performed at [facility], [place] [address], [place], [place] [address]   Blood Culture (routine x 2)     Status: None (Preliminary result)   Collection Time: 07/08/21  6:19 AM   Specimen: BLOOD LEFT ARM  Result Value Ref Range Status   Specimen Description BLOOD LEFT ARM  Final   Special Requests   Final    BOTTLES DRAWN AEROBIC AND ANAEROBIC Blood Culture adequate volume   Culture   Final    NO GROWTH 3 DAYS Performed at [facility], [address] [place] [address] [place], [place] [address]    Report Status PENDING  Incomplete     Time coordinating discharge: Over 30 minutes  SIGNED:   Darliss Cheney, MD  [facility] 07/11/2021, 12:49 PM  If 7PM-7AM, please contact night-coverage [email or web address]

## 2021-07-11 NOTE — Progress Notes (Signed)
Patient refused to go for CT scan , patient educated patient continues to refuse.

## 2021-07-11 NOTE — Progress Notes (Signed)
[facility] ROUNDING NOTE   Subjective: EGD and colonoscopy completed yesterday.  Trying to go home AMA yesterday, but ended up staying overnight.  Refusing labs, ABX today.  Tolerating p.o. intake without issue.  Would very much like to go home today.  He otherwise does not have any complaints.   Objective: Vital signs in last 24 hours: Temp:  [97.5 F (36.4 C)-98.2 F (36.8 C)] 97.8 F (36.6 C) (11/10 0348) Pulse Rate:  [63-121] 121 (11/10 0000) Resp:  [10-22] 22 (11/10 0348) BP: (59-150)/(26-84) 106/69 (11/10 0348) SpO2:  [81 %-100 %] 91 % (11/10 0348) Weight:  [57.6 kg-59.9 kg] 57.6 kg (11/10 0054) Last BM Date: 07/09/21 General: NAD Abdomen:  Soft, NT, ND, +BS Ext:  No c/c/e    Intake/Output from previous day: 11/09 0701 - 11/10 0700 In: 880 [P.O.:480; I.V.:400] Out: 2325  Intake/Output this shift: No intake/output data recorded.   Lab Results: No results for input(s): WBC, HGB, PLT, MCV in the last 72 hours. BMET No results for input(s): NA, K, CL, CO2, GLUCOSE, BUN, CREATININE, CALCIUM in the last 72 hours. LFT No results for input(s): PROT, ALBUMIN, AST, ALT, ALKPHOS, BILITOT, BILIDIR, IBILI in the last 72 hours. PT/INR No results for input(s): INR in the last 72 hours.    Imaging/Other results: No results found.    Assessment and Plan:  1) Hematochezia - Colonoscopy on 07/10/2021 with grade 3 hemorrhoids.  Mild, nonbleeding diverticulosis, but otherwise no mucosal/luminal pathology. - No further bleeding in the last several days, including no bleeding with bowel preparation - Continue conservative management of hemorrhoids with topical treatment, fiber supplement as an outpatient - Briefly discussed referral to Colorectal Surgery if hemorrhoidal symptoms worsen  2) Gastritis - EGD on 07/10/2021 with mild gastritis and moderate nonulcer duodenitis limited to the duodenal bulb (path pending) - Continue PPI as outpatient x6 weeks, then can  titrate off  3) Atrial flutter - Okay to resume Eliquis 07/13/2021 - Management per Cardiology service  4) ESRD 5) Anemia of chronic disease - Per review of Nephrology notes, patient wishes to DC dialysis and return home with hospice services.  Palliative Care has been consulted  GI service will sign off at this time.  Please do not hesitate to contact with additional questions or concerns  Lavena Bullion, DO  07/11/2021, 10:15 AM [facility] Pager [phone]

## 2021-07-11 NOTE — Progress Notes (Signed)
Cardiology Progress Note  Patient ID: Thomas Robinson MRN: [id number] DOB: 1960-11-08 Date of Encounter: 07/11/2021  Primary Cardiologist: [name], DO  Subjective   Chief Complaint: None.  HPI: Hypotension with dialysis.  Discussed with nephrology and apparently he is not interested in aggressive medical care.  Hospice has been contacted.  Remained stable.  Gastritis found on EGD.  Eliquis can be restarted in 3 days.  ROS:  All other ROS reviewed and negative. Pertinent positives noted in the HPI.     Inpatient Medications  Scheduled Meds:  [START ON 07/13/2021] apixaban  2.5 mg Oral BID   Chlorhexidine Gluconate Cloth  6 each Topical Q0600   cinacalcet  60 mg Oral QPM   doxycycline  100 mg Oral Q12H   fluticasone furoate-vilanterol  1 puff Inhalation Daily   hydrocortisone   Topical BID   metoprolol tartrate  50 mg Oral BID   midodrine  10 mg Oral Q M,W,F-HD   multivitamin  1 tablet Oral QHS   peg 3350 powder  0.5 kit Oral Once   sevelamer carbonate  800-1,600 mg Oral TID with meals   Continuous Infusions:  cefTRIAXone (ROCEPHIN)  IV 1 g (07/10/21 0012)   PRN Meds: acetaminophen, albuterol, ferric citrate, hydrOXYzine, morphine injection, naLOXone (NARCAN)  injection, sevelamer carbonate   Vital Signs   Vitals:   07/10/21 2258 07/11/21 0000 07/11/21 0054 07/11/21 0348  BP: 116/79 92/67  106/69  Pulse: (!) 120 (!) 121    Resp: 20 15  (!) 22  Temp: 98 F (36.7 C) 98 F (36.7 C)  97.8 F (36.6 C)  TempSrc:    Oral  SpO2: 100% 99%  91%  Weight:   57.6 kg   Height:        Intake/Output Summary (Last 24 hours) at 07/11/2021 1009 Last data filed at 07/11/2021 0357 Gross per 24 hour  Intake 880 ml  Output 2325 ml  Net -1445 ml   Last 3 Weights 07/11/2021 07/10/2021 07/10/2021  Weight (lbs) 126 lb 15.8 oz 132 lb 0.9 oz 128 lb 15.5 oz  Weight (kg) 57.6 kg 59.9 kg 58.5 kg      Telemetry  Overnight telemetry shows atrial flutter heart rate 120, which I  personally reviewed.   Physical Exam   Vitals:   07/10/21 2258 07/11/21 0000 07/11/21 0054 07/11/21 0348  BP: 116/79 92/67  106/69  Pulse: (!) 120 (!) 121    Resp: 20 15  (!) 22  Temp: 98 F (36.7 C) 98 F (36.7 C)  97.8 F (36.6 C)  TempSrc:    Oral  SpO2: 100% 99%  91%  Weight:   57.6 kg   Height:        Intake/Output Summary (Last 24 hours) at 07/11/2021 1009 Last data filed at 07/11/2021 0357 Gross per 24 hour  Intake 880 ml  Output 2325 ml  Net -1445 ml    Last 3 Weights 07/11/2021 07/10/2021 07/10/2021  Weight (lbs) 126 lb 15.8 oz 132 lb 0.9 oz 128 lb 15.5 oz  Weight (kg) 57.6 kg 59.9 kg 58.5 kg    Body mass index is 19.31 kg/m.   General: Well nourished, well developed, in no acute distress Head: Atraumatic, normal size  Eyes: PEERLA, EOMI  Neck: Supple, no JVD Endocrine: No thryomegaly Cardiac: Normal S1, S2; tachycardia, 2 out of 6 holosystolic murmur Lungs: Clear to auscultation bilaterally, no wheezing, rhonchi or rales  Abd: Soft, nontender, no hepatomegaly  Ext: No edema, pulses 2+ Musculoskeletal:  No deformities, BUE and BLE strength normal and equal Skin: Warm and dry, no rashes   Neuro: Alert and oriented to person, place, time, and situation, CNII-XII grossly intact, no focal deficits  Psych: Normal mood and affect   Labs  High Sensitivity Troponin:   Recent Labs  Lab 06/29/21 2237 06/30/21 0352 07/01/21 1329 07/02/21 0420  TROPONINIHS 62* 67* 144* 202*     Cardiac EnzymesNo results for input(s): TROPONINI in the last 168 hours. No results for input(s): TROPIPOC in the last 168 hours.  Chemistry Recent Labs  Lab 07/05/21 0614 07/07/21 2025 07/08/21 0608  NA 132* 132* 134*  K 3.9 5.1 3.8  CL 96* 96* 98  CO2 22 20* 18*  GLUCOSE 104* 101* 90  BUN 32* 58* 56*  CREATININE 4.97* 7.79* 8.29*  CALCIUM 8.7* 8.7* 8.7*  PROT 5.9* 6.8  --   ALBUMIN 2.4* 3.1*  --   AST 309* 102*  --   ALT 888* 496*  --   ALKPHOS 118 159*  --   BILITOT 1.2  1.5*  --   GFRNONAA 12* 7* 7*  ANIONGAP 14 16* 18*    Hematology Recent Labs  Lab 07/07/21 2025 07/08/21 0608  WBC 7.2 8.7  RBC 2.96* 2.86*  HGB 10.0* 9.3*  HCT 31.0* 28.8*  MCV 104.7* 100.7*  MCH 33.8 32.5  MCHC 32.3 32.3  RDW 18.8* 19.0*  PLT 123* 105*   BNP Recent Labs  Lab 07/08/21 0608  BNP >4,500.0*    DDimer No results for input(s): DDIMER in the last 168 hours.   Radiology  No results found.  Cardiac Studies  TTE 07/02/2021   1. Strain attempted but patient asked to stop. Would recomm limited echo  with strain only.   2. LV cavitiy is small. Turburlent flow through LV. Near caviity  obliteration. . Left ventricular ejection fraction, by estimation, is 70  to 75%. The left ventricle has hyperdynamic function. The left ventricle  has no regional wall motion abnormalities.   There is severe concentric left ventricular hypertrophy. Left ventricular  diastolic function could not be evaluated.   3. Right ventricular systolic function is moderately reduced. The right  ventricular size is mildly enlarged. Mildly increased right ventricular  wall thickness.   4. Left atrial size was severely dilated.   5. Right atrial size was severely dilated.   6. Mild mitral valve regurgitation. Moderate to severe mitral annular  calcification.   7. Tricuspid valve regurgitation is moderate.   8. AV is thickened with restricted motion. Peak and mean gradients  through the valve are 22 and 12 mm hg respectively. Dimensionless gradient  is 0.53 consistent with mild AS 2 D imaging suggests that it is moderate.  No significant change in gradient  from May 2022. Aortic valve regurgitation is mild.   9. Aortic dilatation noted. There is mild dilatation of the ascending  aorta, measuring 40 mm.   Patient Profile  60 year old male with history of ESRD on hemodialysis, hypertension, asthma, chronic hypoxic respiratory failure on home O2, ascending aortic aneurysm who was admitted on  07/07/2021 with weakness and fatigue and atrial flutter.  Recent admission between 06/29/2021 and 07/05/2021 for septic shock and pneumonia.  Diagnosed with atrial flutter that admission.  Plan was for outpatient TEE/cardioversion.  Assessment & Plan   #Atrial Flutter -EGD shows gastritis.  Eliquis can be restarted 3 days from yesterday which will be 07/13/2021. -For now we will just continue with metoprolol tartrate 50 mg twice  daily. -It appears he is insistent on going home.  He can follow-up for outpatient consideration of TEE/cardioversion.  Discussion with nephrology that he would like to pursue hospice.  He may not even want to do this procedure.  #Severe pulm hypertension #Cor pulmonale #RV failure -This appears to be group 3 pulmonary hypertension due to chronic lung disease.  He has severe COPD with a severe obstructive ventilatory defect on PFTs at [facility]. -Left heart catheterization at [facility] showed normal coronaries with normal LVEDP. -VQ scan here is negative for CTEPH. -Do not suspect any shunting. -Normal LVEDP at the time of heart cath at [facility] excludes left-sided heart failure to explain this. -Overall I believe this is all severe RV failure which is likely approaching end-stage in the setting of severe lung disease.  He is not tolerating hemodialysis due to hypotension. -He had a discussion with nephrology and is not wanting aggressive care.  Hospice will see him today. -Really all we can do for him at this time is midodrine with dialysis.  It would be beneficial to get him back in rhythm but this is delayed due to anemia and needing to hold his Eliquis.  He is insistent on going home.  We can restart Eliquis on Saturday which is 07/13/2021.  He can follow-up for outpatient consideration of TEE/cardioversion.  However if this is inconsistent with his goals of care and he wishes to pursue hospice I would have no quarrels with this. -We will arrange hospital  follow-up in case he would like to proceed with TEE/cardioversion.  #Anemia #Gastritis -Gastritis seen on EGD.  Colonoscopy with evidence of barotrauma.  GI has informed us that he can restart Eliquis in 3 days from yesterday.  This will be Saturday.  [facility] will sign off.   Medication Recommendations: Metoprolol tartrate 50 mg twice daily, hold all other antihypertensive agents, restart Eliquis 3 days after EGD/colonoscopy Other recommendations (labs, testing, etc): None Follow up as an outpatient: We will arrange hospital follow-up in 2 to 4 weeks with Dr. Harriet Masson for consideration of outpatient TEE/DCCV  For questions or updates, please contact [facility] Please consult [email or web address] for contact info under   Time Spent with Patient: I have spent a total of 25 minutes with patient reviewing hospital notes, telemetry, EKGs, labs and examining the patient as well as establishing an assessment and plan that was discussed with the patient.  > 50% of time was spent in direct patient care.    Signed, Addison Naegeli. Audie Box, MD, [place]  07/11/2021 10:09 AM

## 2021-07-11 NOTE — Care Management Important Message (Signed)
Important Message  Patient Details  Name: Thomas Robinson MRN: [id number] Date of Birth: Nov 26, 1960   Medicare Important Message Given:  Yes     Shelda Altes 07/11/2021, 10:17 AM

## 2021-07-11 NOTE — Progress Notes (Signed)
[facility] Progress Note   Subjective:Seen in room. Tolerated HD poorly yesterday due to low BP. Signed out AMA last PM but then didn't leave.  Refused all treatments overnight - IV, abx, labs.  We had a frank conversation this AM about prognosis and GOC.  He above all wishes to go home and is amenable to hospice.  He understands this would mean discontinuation of dialysis and no further hospitalizations. He says this is in line with his wishes.   Objective Vitals:   07/10/21 2258 07/11/21 0000 07/11/21 0054 07/11/21 0348  BP: 116/79 92/67  106/69  Pulse: (!) 120 (!) 121    Resp: 20 15  (!) 22  Temp: 98 F (36.7 C) 98 F (36.7 C)  97.8 F (36.6 C)  TempSrc:    Oral  SpO2: 100% 99%  91%  Weight:   57.6 kg   Height:       Physical Exam General: Chronically ill appearing male in NAD Heart: tachycardic Lungs:  decreased in bases otherwise CTAB No WOB Abdomen:Soft, NABS Extremities:trace 1+BLE edema Dialysis Access: [facility] c/d/I, RUE AVF +t/b, small   Additional Objective Labs: Basic Metabolic Panel: Recent Labs  Lab 07/05/21 0614 07/07/21 2025 07/08/21 0608  NA 132* 132* 134*  K 3.9 5.1 3.8  CL 96* 96* 98  CO2 22 20* 18*  GLUCOSE 104* 101* 90  BUN 32* 58* 56*  CREATININE 4.97* 7.79* 8.29*  CALCIUM 8.7* 8.7* 8.7*    Liver Function Tests: Recent Labs  Lab 07/05/21 0614 07/07/21 2025  AST 309* 102*  ALT 888* 496*  ALKPHOS 118 159*  BILITOT 1.2 1.5*  PROT 5.9* 6.8  ALBUMIN 2.4* 3.1*    No results for input(s): LIPASE, AMYLASE in the last 168 hours. CBC: Recent Labs  Lab 07/07/21 2025 07/08/21 0608  WBC 7.2 8.7  HGB 10.0* 9.3*  HCT 31.0* 28.8*  MCV 104.7* 100.7*  PLT 123* 105*    Blood Culture    Component Value Date/Time   SDES BLOOD LEFT ARM 07/08/2021 0619   SPECREQUEST  07/08/2021 0619    BOTTLES DRAWN AEROBIC AND ANAEROBIC Blood Culture adequate volume   CULT  07/08/2021 0619    NO GROWTH 2 DAYS Performed at [facility], [place] [address], [place], [place] [address]    REPTSTATUS PENDING 07/08/2021 1761    Cardiac Enzymes: No results for input(s): CKTOTAL, CKMB, CKMBINDEX, TROPONINI in the last 168 hours. CBG: Recent Labs  Lab 07/04/21 2336 07/05/21 0423 07/05/21 0814 07/05/21 1209 07/08/21 2345  GLUCAP 103* 124* 84 97 109*    Iron Studies: No results for input(s): IRON, TIBC, TRANSFERRIN, FERRITIN in the last 72 hours. @lablastinr3 @ Studies/Results: No results found. Medications:  cefTRIAXone (ROCEPHIN)  IV 1 g (07/10/21 0012)    [START ON 07/13/2021] apixaban  2.5 mg Oral BID   Chlorhexidine Gluconate Cloth  6 each Topical Q0600   cinacalcet  60 mg Oral QPM   doxycycline  100 mg Oral Q12H   fluticasone furoate-vilanterol  1 puff Inhalation Daily   hydrocortisone   Topical BID   metoprolol tartrate  50 mg Oral BID   midodrine  10 mg Oral Q M,W,F-HD   multivitamin  1 tablet Oral QHS   peg 3350 powder  0.5 kit Oral Once   sevelamer carbonate  800-1,600 mg Oral TID with meals     OP HD: MWF AF  4h  400/800  50kg   2/2 bath RIJ TDC/ RUE AVF (maturing)  -  Heparin 2000 units IV TIW  - sensipar  60 mg PO tiw  - venofer 50 mg IV weekly  - mircera 225 mcg IV q 2 weeks, last 10/19, due 11/2   Assessment/ Plan: Mr. Monestime and I had a frank conversation this morning about his chronic medical conditions, downward trajectory and prognosis along with his continuous refusal of medical care.  His main goals are to return home and avoid hospitalization so I recommended d/c dialysis and return home with hospice services.  He is agreeable to this and expressed appreciation of the conversation.  Hospice has been contacted.   Will sign off, please call if I can further assist.   Jannifer Hick MD [place] Pager [phone]

## 2021-07-13 LAB — CULTURE, BLOOD (ROUTINE X 2)
Culture: NO GROWTH
Culture: NO GROWTH
Special Requests: ADEQUATE
Special Requests: ADEQUATE

## 2021-07-16 ENCOUNTER — Other Ambulatory Visit: Payer: Self-pay

## 2021-07-16 ENCOUNTER — Emergency Department (HOSPITAL_COMMUNITY): Payer: Medicare Other

## 2021-07-16 ENCOUNTER — Inpatient Hospital Stay (HOSPITAL_COMMUNITY)
Admission: EM | Admit: 2021-07-16 | Discharge: 2021-07-27 | DRG: 291 | Discharge status: Against Medical Advice | Payer: Medicare Other | Attending: Family Medicine | Admitting: Family Medicine

## 2021-07-16 DIAGNOSIS — N2581 Secondary hyperparathyroidism of renal origin: Secondary | ICD-10-CM | POA: Diagnosis present

## 2021-07-16 DIAGNOSIS — R197 Diarrhea, unspecified: Secondary | ICD-10-CM | POA: Diagnosis present

## 2021-07-16 DIAGNOSIS — K648 Other hemorrhoids: Secondary | ICD-10-CM | POA: Diagnosis present

## 2021-07-16 DIAGNOSIS — N481 Balanitis: Secondary | ICD-10-CM | POA: Diagnosis present

## 2021-07-16 DIAGNOSIS — Z9981 Dependence on supplemental oxygen: Secondary | ICD-10-CM

## 2021-07-16 DIAGNOSIS — D631 Anemia in chronic kidney disease: Secondary | ICD-10-CM | POA: Diagnosis present

## 2021-07-16 DIAGNOSIS — I50812 Chronic right heart failure: Secondary | ICD-10-CM | POA: Diagnosis not present

## 2021-07-16 DIAGNOSIS — Z8673 Personal history of transient ischemic attack (TIA), and cerebral infarction without residual deficits: Secondary | ICD-10-CM

## 2021-07-16 DIAGNOSIS — J9611 Chronic respiratory failure with hypoxia: Secondary | ICD-10-CM | POA: Diagnosis present

## 2021-07-16 DIAGNOSIS — D6959 Other secondary thrombocytopenia: Secondary | ICD-10-CM | POA: Diagnosis present

## 2021-07-16 DIAGNOSIS — I5032 Chronic diastolic (congestive) heart failure: Secondary | ICD-10-CM | POA: Diagnosis present

## 2021-07-16 DIAGNOSIS — Z981 Arthrodesis status: Secondary | ICD-10-CM

## 2021-07-16 DIAGNOSIS — I10 Essential (primary) hypertension: Secondary | ICD-10-CM | POA: Diagnosis present

## 2021-07-16 DIAGNOSIS — Z91138 Patient's unintentional underdosing of medication regimen for other reason: Secondary | ICD-10-CM

## 2021-07-16 DIAGNOSIS — K2970 Gastritis, unspecified, without bleeding: Secondary | ICD-10-CM | POA: Diagnosis present

## 2021-07-16 DIAGNOSIS — I4892 Unspecified atrial flutter: Secondary | ICD-10-CM | POA: Diagnosis present

## 2021-07-16 DIAGNOSIS — Z20822 Contact with and (suspected) exposure to covid-19: Secondary | ICD-10-CM | POA: Diagnosis present

## 2021-07-16 DIAGNOSIS — R578 Other shock: Secondary | ICD-10-CM | POA: Diagnosis not present

## 2021-07-16 DIAGNOSIS — G2581 Restless legs syndrome: Secondary | ICD-10-CM | POA: Diagnosis present

## 2021-07-16 DIAGNOSIS — M898X9 Other specified disorders of bone, unspecified site: Secondary | ICD-10-CM | POA: Diagnosis present

## 2021-07-16 DIAGNOSIS — K219 Gastro-esophageal reflux disease without esophagitis: Secondary | ICD-10-CM | POA: Diagnosis present

## 2021-07-16 DIAGNOSIS — M1990 Unspecified osteoarthritis, unspecified site: Secondary | ICD-10-CM | POA: Diagnosis present

## 2021-07-16 DIAGNOSIS — Z7951 Long term (current) use of inhaled steroids: Secondary | ICD-10-CM

## 2021-07-16 DIAGNOSIS — Z5329 Procedure and treatment not carried out because of patient's decision for other reasons: Secondary | ICD-10-CM | POA: Diagnosis not present

## 2021-07-16 DIAGNOSIS — E162 Hypoglycemia, unspecified: Secondary | ICD-10-CM | POA: Diagnosis present

## 2021-07-16 DIAGNOSIS — N186 End stage renal disease: Secondary | ICD-10-CM | POA: Diagnosis not present

## 2021-07-16 DIAGNOSIS — I484 Atypical atrial flutter: Secondary | ICD-10-CM | POA: Diagnosis not present

## 2021-07-16 DIAGNOSIS — I7121 Aneurysm of the ascending aorta, without rupture: Secondary | ICD-10-CM | POA: Diagnosis present

## 2021-07-16 DIAGNOSIS — Z5900 Homelessness unspecified: Secondary | ICD-10-CM

## 2021-07-16 DIAGNOSIS — E871 Hypo-osmolality and hyponatremia: Secondary | ICD-10-CM | POA: Diagnosis not present

## 2021-07-16 DIAGNOSIS — I350 Nonrheumatic aortic (valve) stenosis: Secondary | ICD-10-CM | POA: Diagnosis present

## 2021-07-16 DIAGNOSIS — R64 Cachexia: Secondary | ICD-10-CM | POA: Diagnosis not present

## 2021-07-16 DIAGNOSIS — Z6820 Body mass index (BMI) 20.0-20.9, adult: Secondary | ICD-10-CM

## 2021-07-16 DIAGNOSIS — E874 Mixed disorder of acid-base balance: Secondary | ICD-10-CM | POA: Diagnosis present

## 2021-07-16 DIAGNOSIS — R079 Chest pain, unspecified: Secondary | ICD-10-CM | POA: Diagnosis present

## 2021-07-16 DIAGNOSIS — Z515 Encounter for palliative care: Secondary | ICD-10-CM | POA: Diagnosis not present

## 2021-07-16 DIAGNOSIS — I5033 Acute on chronic diastolic (congestive) heart failure: Secondary | ICD-10-CM | POA: Diagnosis present

## 2021-07-16 DIAGNOSIS — J449 Chronic obstructive pulmonary disease, unspecified: Secondary | ICD-10-CM | POA: Diagnosis present

## 2021-07-16 DIAGNOSIS — Z452 Encounter for adjustment and management of vascular access device: Secondary | ICD-10-CM

## 2021-07-16 DIAGNOSIS — I132 Hypertensive heart and chronic kidney disease with heart failure and with stage 5 chronic kidney disease, or end stage renal disease: Principal | ICD-10-CM | POA: Diagnosis present

## 2021-07-16 DIAGNOSIS — R0602 Shortness of breath: Secondary | ICD-10-CM | POA: Diagnosis present

## 2021-07-16 DIAGNOSIS — Z881 Allergy status to other antibiotic agents status: Secondary | ICD-10-CM

## 2021-07-16 DIAGNOSIS — L89152 Pressure ulcer of sacral region, stage 2: Secondary | ICD-10-CM | POA: Diagnosis present

## 2021-07-16 DIAGNOSIS — Z9115 Patient's noncompliance with renal dialysis: Secondary | ICD-10-CM

## 2021-07-16 DIAGNOSIS — J9601 Acute respiratory failure with hypoxia: Secondary | ICD-10-CM | POA: Diagnosis present

## 2021-07-16 DIAGNOSIS — Z992 Dependence on renal dialysis: Secondary | ICD-10-CM | POA: Diagnosis not present

## 2021-07-16 DIAGNOSIS — I2781 Cor pulmonale (chronic): Secondary | ICD-10-CM | POA: Diagnosis present

## 2021-07-16 DIAGNOSIS — R451 Restlessness and agitation: Secondary | ICD-10-CM | POA: Diagnosis present

## 2021-07-16 DIAGNOSIS — Z79899 Other long term (current) drug therapy: Secondary | ICD-10-CM

## 2021-07-16 DIAGNOSIS — I428 Other cardiomyopathies: Secondary | ICD-10-CM | POA: Diagnosis present

## 2021-07-16 DIAGNOSIS — Z7901 Long term (current) use of anticoagulants: Secondary | ICD-10-CM

## 2021-07-16 DIAGNOSIS — R531 Weakness: Secondary | ICD-10-CM | POA: Diagnosis not present

## 2021-07-16 DIAGNOSIS — I5082 Biventricular heart failure: Secondary | ICD-10-CM | POA: Diagnosis present

## 2021-07-16 DIAGNOSIS — E46 Unspecified protein-calorie malnutrition: Secondary | ICD-10-CM | POA: Diagnosis present

## 2021-07-16 DIAGNOSIS — T45516A Underdosing of anticoagulants, initial encounter: Secondary | ICD-10-CM | POA: Diagnosis present

## 2021-07-16 DIAGNOSIS — I272 Pulmonary hypertension, unspecified: Secondary | ICD-10-CM | POA: Diagnosis not present

## 2021-07-16 DIAGNOSIS — I739 Peripheral vascular disease, unspecified: Secondary | ICD-10-CM | POA: Diagnosis present

## 2021-07-16 DIAGNOSIS — Z9289 Personal history of other medical treatment: Secondary | ICD-10-CM

## 2021-07-16 DIAGNOSIS — I469 Cardiac arrest, cause unspecified: Secondary | ICD-10-CM | POA: Diagnosis not present

## 2021-07-16 DIAGNOSIS — Z66 Do not resuscitate: Secondary | ICD-10-CM | POA: Diagnosis not present

## 2021-07-16 DIAGNOSIS — E785 Hyperlipidemia, unspecified: Secondary | ICD-10-CM | POA: Diagnosis present

## 2021-07-16 DIAGNOSIS — Z4659 Encounter for fitting and adjustment of other gastrointestinal appliance and device: Secondary | ICD-10-CM

## 2021-07-16 DIAGNOSIS — D638 Anemia in other chronic diseases classified elsewhere: Secondary | ICD-10-CM | POA: Diagnosis not present

## 2021-07-16 DIAGNOSIS — I959 Hypotension, unspecified: Secondary | ICD-10-CM | POA: Diagnosis present

## 2021-07-16 DIAGNOSIS — G8929 Other chronic pain: Secondary | ICD-10-CM | POA: Diagnosis present

## 2021-07-16 DIAGNOSIS — Z87892 Personal history of anaphylaxis: Secondary | ICD-10-CM

## 2021-07-16 LAB — TROPONIN I (HIGH SENSITIVITY)
Troponin I (High Sensitivity): 74 ng/L — ABNORMAL HIGH (ref <18)
Troponin I (High Sensitivity): 75 ng/L — ABNORMAL HIGH (ref <18)

## 2021-07-16 LAB — RESP PANEL BY RT-PCR (FLU A&B, COVID) ARPGX2
Influenza A by PCR: NEGATIVE
Influenza B by PCR: NEGATIVE
SARS Coronavirus 2 by RT PCR: NEGATIVE

## 2021-07-16 LAB — BASIC METABOLIC PANEL
Anion gap: 18 — ABNORMAL HIGH (ref 5–15)
BUN: 58 mg/dL — ABNORMAL HIGH (ref 8–23)
CO2: 15 mmol/L — ABNORMAL LOW (ref 22–32)
Calcium: 9.2 mg/dL (ref 8.9–10.3)
Chloride: 107 mmol/L (ref 98–111)
Creatinine, Ser: 10.28 mg/dL — ABNORMAL HIGH (ref 0.61–1.24)
GFR, Estimated: 5 mL/min — ABNORMAL LOW (ref >60)
Glucose, Bld: 99 mg/dL (ref 70–99)
Potassium: 4.6 mmol/L (ref 3.5–5.1)
Sodium: 140 mmol/L (ref 135–145)

## 2021-07-16 LAB — CBC
HCT: 31.4 % — ABNORMAL LOW (ref 39.0–52.0)
Hemoglobin: 9.4 g/dL — ABNORMAL LOW (ref 13.0–17.0)
MCH: 33.7 pg (ref 26.0–34.0)
MCHC: 29.9 g/dL — ABNORMAL LOW (ref 30.0–36.0)
MCV: 112.5 fL — ABNORMAL HIGH (ref 80.0–100.0)
Platelets: 122 10*3/uL — ABNORMAL LOW (ref 150–400)
RBC: 2.79 MIL/uL — ABNORMAL LOW (ref 4.22–5.81)
RDW: 23.9 % — ABNORMAL HIGH (ref 11.5–15.5)
WBC: 6.5 10*3/uL (ref 4.0–10.5)
nRBC: 0.3 % — ABNORMAL HIGH (ref 0.0–0.2)

## 2021-07-16 MED ORDER — ACETAMINOPHEN 325 MG PO TABS
650.0000 mg | ORAL_TABLET | Freq: Four times a day (QID) | ORAL | Status: DC | PRN
  Filled 2021-07-16: qty 2

## 2021-07-16 MED ORDER — CHLORHEXIDINE GLUCONATE CLOTH 2 % EX PADS
6.0000 | MEDICATED_PAD | Freq: Every day | CUTANEOUS | Status: DC
  Administered 2021-07-17 – 2021-07-21 (×3): 6 via TOPICAL

## 2021-07-16 MED ORDER — PANTOPRAZOLE SODIUM 40 MG PO TBEC
40.0000 mg | DELAYED_RELEASE_TABLET | Freq: Two times a day (BID) | ORAL | Status: DC
  Administered 2021-07-17 – 2021-07-18 (×4): 40 mg via ORAL
  Filled 2021-07-16 (×5): qty 1

## 2021-07-16 MED ORDER — HEPARIN SODIUM (PORCINE) 1000 UNIT/ML DIALYSIS
2000.0000 [IU] | INTRAMUSCULAR | Status: DC | PRN
  Filled 2021-07-16: qty 2

## 2021-07-16 MED ORDER — FLUTICASONE FUROATE-VILANTEROL 100-25 MCG/ACT IN AEPB: 1.0000 | INHALATION_SPRAY | Freq: Every day | RESPIRATORY_TRACT | Status: DC | PRN

## 2021-07-16 MED ORDER — RENA-VITE PO TABS
1.0000 | ORAL_TABLET | Freq: Every day | ORAL | Status: DC
  Administered 2021-07-17 – 2021-07-18 (×2): 1 via ORAL
  Filled 2021-07-16 (×3): qty 1

## 2021-07-16 MED ORDER — SODIUM CHLORIDE 0.9 % IV SOLN: 250.0000 mL | INTRAVENOUS | Status: DC | PRN

## 2021-07-16 MED ORDER — NYSTATIN 100000 UNIT/GM EX POWD
Freq: Two times a day (BID) | CUTANEOUS | Status: DC
  Administered 2021-07-26: 1 via TOPICAL
  Filled 2021-07-16 (×3): qty 15

## 2021-07-16 MED ORDER — ACETAMINOPHEN 650 MG RE SUPP: 650.0000 mg | Freq: Four times a day (QID) | RECTAL | Status: DC | PRN

## 2021-07-16 MED ORDER — CINACALCET HCL 30 MG PO TABS
60.0000 mg | ORAL_TABLET | Freq: Every evening | ORAL | Status: DC
  Administered 2021-07-17: 60 mg via ORAL
  Filled 2021-07-16 (×2): qty 2

## 2021-07-16 MED ORDER — ALBUTEROL SULFATE HFA 108 (90 BASE) MCG/ACT IN AERS
2.0000 | INHALATION_SPRAY | Freq: Two times a day (BID) | RESPIRATORY_TRACT | Status: DC | PRN
  Filled 2021-07-16 (×2): qty 6.7

## 2021-07-16 MED ORDER — SODIUM CHLORIDE 0.9% FLUSH
3.0000 mL | Freq: Two times a day (BID) | INTRAVENOUS | Status: DC
  Administered 2021-07-17 – 2021-07-26 (×13): 3 mL via INTRAVENOUS

## 2021-07-16 MED ORDER — MUPIROCIN 2 % EX OINT
TOPICAL_OINTMENT | Freq: Three times a day (TID) | CUTANEOUS | Status: DC
  Administered 2021-07-26: 1 via TOPICAL
  Filled 2021-07-16 (×7): qty 22

## 2021-07-16 MED ORDER — METOPROLOL TARTRATE 25 MG PO TABS
25.0000 mg | ORAL_TABLET | Freq: Two times a day (BID) | ORAL | Status: DC
  Administered 2021-07-16 – 2021-07-17 (×3): 25 mg via ORAL
  Filled 2021-07-16 (×3): qty 1

## 2021-07-16 MED ORDER — APIXABAN 2.5 MG PO TABS
2.5000 mg | ORAL_TABLET | Freq: Two times a day (BID) | ORAL | Status: DC
  Filled 2021-07-16: qty 1

## 2021-07-16 MED ORDER — CEPHALEXIN 500 MG PO CAPS
500.0000 mg | ORAL_CAPSULE | Freq: Two times a day (BID) | ORAL | Status: DC
  Administered 2021-07-16 – 2021-07-18 (×5): 500 mg via ORAL
  Filled 2021-07-16 (×2): qty 1
  Filled 2021-07-16: qty 2
  Filled 2021-07-16 (×2): qty 1

## 2021-07-16 MED ORDER — MIDODRINE HCL 5 MG PO TABS
10.0000 mg | ORAL_TABLET | Freq: Two times a day (BID) | ORAL | Status: DC
  Administered 2021-07-16 – 2021-07-18 (×5): 10 mg via ORAL
  Filled 2021-07-16 (×5): qty 2

## 2021-07-16 MED ORDER — SODIUM CHLORIDE 0.9% FLUSH: 3.0000 mL | INTRAVENOUS | Status: DC | PRN

## 2021-07-16 NOTE — ED Notes (Signed)
Change pt o2 tank placed pt back on 2l

## 2021-07-16 NOTE — Consult Note (Signed)
Renal Service Consult Note [facility]  Thomas Robinson 07/16/2021 [name], MD Requesting Physician: Dr Sherry Ruffing  Reason for Consult: ESRD pt w/ malfunctioning access HPI: The patient is a 60 y.o. year-old w/ hx of asthma, CAP, CVA, COPD, chronic back pain, ESRD on HD, GIB, HL, hypotension on midodrine, RLS who presented to ED today for CP and retaining fluid, also hemorrhoids and bed sores. His TDC was removed last week. He has an AVF which may or may not be usable, hasn't been tried yet.  Asked to see for ESRD.    Pt states last week he was put on hospice, but this week he has changed his mind and "wants to live".  His TDC was removed last week.  Other c/o's are leg swelling, chronic pain. No sig SOB, orthopnea.   Pt lives alone , has an aide there "most of the time".    ROS - denies CP, no joint pain, no HA, no blurry vision, no rash, no diarrhea, no nausea/ vomiting, no dysuria, no difficulty voiding   Past Medical History  Past Medical History:  Diagnosis Date   Anaphylactic shock, unspecified, sequela 06/10/2019   Aortic stenosis    mild-moderate AS 12/2020 echo   Arthritis    Asthma, chronic, unspecified asthma severity, with acute exacerbation 10/23/2017   CAP (community acquired pneumonia) 10/23/2017   Carpal tunnel syndrome of right wrist 10/05/2018   Cataract    right - removed by surgery   Cerebral infarction due to thrombosis of cerebral artery (HCC)    Cervical disc herniation 01/04/2020   Chronic diastolic heart failure ([place]) 04/13/2018   Chronic low back pain 11/24/2019   Constipation    COPD (chronic obstructive pulmonary disease) (HCC)    Cough    chronic cough   Dyspnea    Encounter for immunization 07/08/2017   ESRD on hemodialysis ([place]) 02/16/2018   Fall 06/04/2019   GERD (gastroesophageal reflux disease) 06/04/2019   GI bleeding 05/24/2017   Gram-negative sepsis, unspecified ([place]) 09/05/2017   History of fusion of cervical  spine 03/26/2020   Hyperlipidemia    Hypertension    Hypokalemia 06/04/2017   Iron deficiency anemia, unspecified 09/09/2017   Left shoulder pain 11/11/2019   Lumbar radiculopathy 11/24/2019   Lung contusion 06/04/2019   LVH (left ventricular hypertrophy) due to hypertensive disease, with heart failure ([place]) 06/18/2020   Macrocytic anemia 05/24/2017   Moderate protein-calorie malnutrition ([place]) 06/06/2017   Myofascial pain syndrome 06/12/2020   Neuritis of right ulnar nerve 09/13/2018   Non-compliance with renal dialysis ([place]) 02/08/2020   Oxygen deficiency 12/30/2019   O2 sats on RA 87% at PAT appt    Pain in joint of right elbow 09/13/2018   Pulmonary hypertension ([place])    Renovascular hypertension 06/22/2020   Restless leg syndrome    Rib fractures 06/2019   Right   S/P cardiac cath 08/27/2020   normal coronary arteries.   Secondary hyperparathyroidism of renal origin ([employer]) 06/04/2017   Thoracic ascending aortic aneurysm    4.5 cm 06/04/19 CT   Ulnar neuropathy 10/05/2018   Volume overload 10/23/2017   Past Surgical History  Past Surgical History:  Procedure Laterality Date   A/V FISTULAGRAM N/A 01/01/2021   Procedure: A/V FISTULAGRAM;  Surgeon: Serafina Mitchell, MD;  Location: [place] [facility];  Service: Cardiovascular;  Laterality: N/A;   A/V FISTULAGRAM N/A 01/15/2021   Procedure: A/V [id number];  Surgeon: Serafina Mitchell, MD;  Location: [place] [facility];  Service: Cardiovascular;  Laterality: N/A;   ANTERIOR CERVICAL DECOMP/DISCECTOMY FUSION N/A 01/04/2020   Procedure: ANTERIOR CERVICAL DECOMPRESSION/DISCECTOMY FUSION CERVICAL FIVE THROUGH SEVEN;  Surgeon: Melina Schools, MD;  Location: [place];  Service: Orthopedics;  Laterality: N/A;  3 hrs   AV FISTULA PLACEMENT Left 05/28/2017   Procedure: LEFT ARM ARTERIOVENOUS (AV) FISTULA CREATION;  Surgeon: Conrad Merriam, MD;  Location: [place];  Service: Vascular;  Laterality: Left;   AV FISTULA PLACEMENT Left 02/02/2021    Procedure: Debride left forearm, ligation of left upper arm Aretiovenous fistula;  Surgeon: Elam Dutch, MD;  Location: [place];  Service: Vascular;  Laterality: Left;   AV FISTULA PLACEMENT Right 04/04/2021   Procedure: RIGHT ARTERIOVENOUS (AV) FISTULA CREATION;  Surgeon: Serafina Mitchell, MD;  Location: [place] [facility];  Service: Vascular;  Laterality: Right;   BIOPSY  07/10/2021   Procedure: BIOPSY;  Surgeon: Lavena Bullion, DO;  Location: [place] [facility];  Service: Gastroenterology;;   COLONOSCOPY WITH PROPOFOL N/A 07/10/2021   Procedure: COLONOSCOPY WITH PROPOFOL;  Surgeon: Lavena Bullion, DO;  Location: [place];  Service: Gastroenterology;  Laterality: N/A;   ESOPHAGOGASTRODUODENOSCOPY (EGD) WITH PROPOFOL N/A 07/10/2021   Procedure: ESOPHAGOGASTRODUODENOSCOPY (EGD) WITH PROPOFOL;  Surgeon: Lavena Bullion, DO;  Location: [place];  Service: Gastroenterology;  Laterality: N/A;   EYE SURGERY Right 06/02/2019   Cataract removed   FRACTURE SURGERY     INSERTION OF DIALYSIS CATHETER Right 02/02/2021   Procedure: INSERTION OF Right Internal jugular TUNNELED  DIALYSIS CATHETER;  Surgeon: Elam Dutch, MD;  Location: [facility];  Service: Vascular;  Laterality: Right;   IR DIALY SHUNT INTRO NEEDLE/INTRACATH INITIAL W/IMG LEFT Left 09/12/2020   IR FLUORO GUIDE CV LINE RIGHT  05/25/2017   IR US GUIDE VASC ACCESS LEFT  09/12/2020   IR US GUIDE VASC ACCESS RIGHT  05/25/2017   LIGATION OF ARTERIOVENOUS  FISTULA Left 01/10/2021   Procedure: LIGATION OF LEFT ARM RADIOCEPHALIC FISTULA;  Surgeon: Serafina Mitchell, MD;  Location: [place];  Service: Vascular;  Laterality: Left;   PERIPHERAL VASCULAR BALLOON ANGIOPLASTY Left 01/15/2021   Procedure: PERIPHERAL VASCULAR BALLOON ANGIOPLASTY;  Surgeon: Serafina Mitchell, MD;  Location: [place] [facility];  Service: Cardiovascular;  Laterality: Left;  AVF   REVISON OF ARTERIOVENOUS FISTULA Left 07/18/2019   Procedure: REVISION PLICATION OF RADIOCEPHALIC ARTERIOVENOUS  FISTULA LEFT ARM;  Surgeon: Angelia Mould, MD;  Location[facility];  Service: Vascular;  Laterality: Left;   REVISON OF ARTERIOVENOUS FISTULA Left 01/10/2021   Procedure: CONVERSION TO BRACHIOCEPHALIC ARTERIOVENOUS FISTULA;  Surgeon: Serafina Mitchell, MD;  Location: [facility] OR;  Service: Vascular;  Laterality: Left;   RINOPLASTY     Family History  Family History  Problem Relation Age of Onset   Cancer Mother    Social History  reports that he has never smoked. He has never used smokeless tobacco. He reports that he does not currently use alcohol. He reports that he does not use drugs. Allergies  Allergies  Allergen Reactions   Vancomycin Shortness Of Breath and Itching   Home medications Prior to Admission medications   Medication Sig Start Date End Date Taking? Authorizing Provider  acetaminophen (TYLENOL) 500 MG tablet Take 1,000 mg by mouth every 6 (six) hours as needed for mild pain, moderate pain or headache.    [provider]  albuterol (VENTOLIN HFA) 108 (90 Base) MCG/ACT inhaler Inhale 2 puffs into the lungs 2 (two) times daily as needed for shortness of breath  or wheezing. 06/01/21   Oswald Hillock, MD  apixaban (ELIQUIS) 2.5 MG TABS tablet Take 1 tablet (2.5 mg total) by mouth 2 (two) times daily. 07/13/21 08/12/21  Darliss Cheney, MD  BREO ELLIPTA 100-25 MCG/INH AEPB Inhale 1 puff into the lungs daily. Patient taking differently: Inhale 1 puff into the lungs daily as needed (shortness of breath). 06/01/21 07/31/21  Oswald Hillock, MD  cinacalcet (SENSIPAR) 60 MG tablet Take 1 tablet (60 mg total) by mouth every evening. 06/01/21   Oswald Hillock, MD  ferric citrate (AURYXIA) 1 GM 210 MG(Fe) tablet Take 210 mg by mouth See admin instructions. Take one tablet (210 mg) by mouth occasionally    [provider]  furosemide (LASIX) 80 MG tablet Take 1 tablet (80 mg total) by mouth daily as needed for fluid (Use only on non-HD days (Tues/Thur/Sat/Sun)). Patient taking  differently: Take 80 mg by mouth every morning. 06/01/21   Oswald Hillock, MD  metoprolol tartrate (LOPRESSOR) 50 MG tablet Take 1 tablet (50 mg total) by mouth 2 (two) times daily. 06/01/21   Oswald Hillock, MD  midodrine (PROAMATINE) 10 MG tablet Take 1 tablet (10 mg total) by mouth 3 (three) times daily with meals. Patient taking differently: Take 10 mg by mouth 2 (two) times daily. 07/05/21 08/04/21  Antonieta Pert, MD  multivitamin (RENA-VIT) TABS tablet Take 1 tablet by mouth at bedtime. 01/12/18   [provider]  sevelamer carbonate (RENVELA) 800 MG tablet Take 800-1,600 mg by mouth See admin instructions. Take 1-2 tablets ([phone] mg) by mouth three times daily with meals, take 1 tablet (800 mg) with snacks 05/31/21   [provider]     Vitals:   07/16/21 0916 07/16/21 1047 07/16/21 1226 07/16/21 1447  BP: 115/90 118/75 117/82 109/62  Pulse: (!) 125 (!) 123 (!) 125 (!) 123  Resp: 20 14 15 17   Temp: 97.9 F (36.6 C) 97.7 F (36.5 C) 97.8 F (36.6 C) 97.9 F (36.6 C)  TempSrc:  Oral Oral Oral  SpO2: 97% 97% 98% 100%   Exam Gen alert, no distress No rash, cyanosis or gangrene Sclera anicteric, throat clear  No jvd or bruits Chest clear bilat to bases, no rales/ wheezing RRR no MRG Abd soft ntnd no mass or ascites +bs GU normal  MS no joint effusions or deformity Ext 2+ diffuse bilat LE edema from feet to hips, no wounds or ulcers Neuro is alert, Ox 3 , nf, gen deconditioning     Home meds include - eliquis, breo ellipta, sensipar, lasix 80 non hd days, metoprolol 50 bid, midodrine 10 bid, rena vit, renvela 1-2 ac,      OP HD: from 07/02/21 > MWF AF  4h  400/800    2/2 bath  Heparin 2000  RUE AVF (maturing)    - 1st stage R basilic vein AVF creation done by Dr Trula Slade on 04/04/21   Assessment/ Plan: Problem w/ dialysis access - [facility] removed last week for hospice transition, now pt changed his mind and wants to continue with dialysis. The relatively new AVF is  not ready for use. Plan admit pt, consult IR for new TDC.  Consult [facility] in am for f/u of AVF access, ?needs 2nd surgery.  ESRD - on HD MWF.  Plan HD tomorrow after access placed.  Hypotension - on midodrine pre hd tiw Volume - vol excess, diffuse LE edema COPD MBD ckd - cont binders, get records in am Anemia ckd - Hb  9.4, get records in am      [name]  MD 07/16/2021, 3:35 PM  Recent Labs  Lab 07/16/21 0922  WBC 6.5  HGB 9.4*   Recent Labs  Lab 07/16/21 0922  K 4.6  BUN 58*  CREATININE 10.28*  CALCIUM 9.2

## 2021-07-16 NOTE — ED Provider Notes (Signed)
Care assumed from previous team.  At time of transfer care, patient is up getting dialysis.  Plan per previous team who spoke to Dr. Jonnie Finner with nephrology, patient will need admission after dialysis today.  Plan is to call medicine for admission and start that process so when he returns, he will be able to be admitted.   Shantell Belongia, Gwenyth Allegra, MD 07/16/21 1721

## 2021-07-16 NOTE — ED Provider Notes (Signed)
[facility] EMERGENCY DEPARTMENT Provider Note   CSN: [id number] Arrival date & time: 07/16/21  2993     History Chief Complaint  Patient presents with   Chest Pain   Leg Pain   Leg Swelling   Vascular Access Problem    Thomas Robinson is a 60 y.o. male.  HPI   Patient presents with chest pain.  Started acutely 2 days ago, its been intermittent.  It feels like a heaviness or pressure something sitting on his chest, it occasionally radiates to the back but not consistently.  No associated alleviating or aggravating factors.  No associated nausea or vomiting, there is occasional shortness of breath.  Patient is chronically short of breath and on 2 and half liters at baseline.  He reports that he feels like his heart is racing, history of a recent arrhythmia.  History of atrial flutter but no longer on anticoagulation.  Was admitted a week ago due to GI bleed and had an EGD done.  Patient left AMA.  Somewhat confusing history, patient states his last dialysis was a week ago but is normally Monday Wednesday Friday.  Had his fistula removed a week ago, patient is agitated its unclear if this was intended to be completed..   Past Medical History:  Diagnosis Date   Anaphylactic shock, unspecified, sequela 06/10/2019   Aortic stenosis    mild-moderate AS 12/2020 echo   Arthritis    Asthma, chronic, unspecified asthma severity, with acute exacerbation 10/23/2017   CAP (community acquired pneumonia) 10/23/2017   Carpal tunnel syndrome of right wrist 10/05/2018   Cataract    right - removed by surgery   Cerebral infarction due to thrombosis of cerebral artery (HCC)    Cervical disc herniation 01/04/2020   Chronic diastolic heart failure ([place]) 04/13/2018   Chronic low back pain 11/24/2019   Constipation    COPD (chronic obstructive pulmonary disease) (HCC)    Cough    chronic cough   Dyspnea    Encounter for immunization 07/08/2017   ESRD on hemodialysis ([place])  02/16/2018   Fall 06/04/2019   GERD (gastroesophageal reflux disease) 06/04/2019   GI bleeding 05/24/2017   Gram-negative sepsis, unspecified ([place]) 09/05/2017   History of fusion of cervical spine 03/26/2020   Hyperlipidemia    Hypertension    Hypokalemia 06/04/2017   Iron deficiency anemia, unspecified 09/09/2017   Left shoulder pain 11/11/2019   Lumbar radiculopathy 11/24/2019   Lung contusion 06/04/2019   LVH (left ventricular hypertrophy) due to hypertensive disease, with heart failure ([place]) 06/18/2020   Macrocytic anemia 05/24/2017   Moderate protein-calorie malnutrition ([place]) 06/06/2017   Myofascial pain syndrome 06/12/2020   Neuritis of right ulnar nerve 09/13/2018   Non-compliance with renal dialysis ([place]) 02/08/2020   Oxygen deficiency 12/30/2019   O2 sats on RA 87% at PAT appt    Pain in joint of right elbow 09/13/2018   Pulmonary hypertension ([place])    Renovascular hypertension 06/22/2020   Restless leg syndrome    Rib fractures 06/2019   Right   S/P cardiac cath 08/27/2020   normal coronary arteries.   Secondary hyperparathyroidism of renal origin ([place]) 06/04/2017   Thoracic ascending aortic aneurysm    4.5 cm 06/04/19 CT   Ulnar neuropathy 10/05/2018   Volume overload 10/23/2017    Patient Active Problem List   Diagnosis Date Noted   Vomiting    Gastritis and gastroduodenitis    Grade III hemorrhoids    Decubitus ulcer of coccyx, stage  2 ([place]) 07/08/2021   Prolonged QT interval 07/08/2021   Hematochezia    Atrial flutter (HCC)    Pressure injury of skin 07/02/2021   Abnormal chest CT 06/30/2021   Pleural effusion, right 06/30/2021   SIRS (systemic inflammatory response syndrome) ([place]) 06/30/2021   Acute hypoxemic respiratory failure ([place]) 05/18/2021   Bronchitis 05/18/2021   Preoperative cardiovascular examination 03/06/2021   CHF (congestive heart failure) ([place]) 02/17/2021   Cellulitis of arm, left 02/01/2021   Cellulitis of left lower limb 02/01/2021    Cellulitis, unspecified 02/01/2021   Left arm swelling 01/25/2021   Acute and chronic respiratory failure with hypoxia ([place]) 01/13/2021   Acute on chronic respiratory failure with hypoxia ([place]) 01/12/2021   Concussion with no loss of consciousness 01/12/2021   Laceration without foreign body of left forearm, initial encounter 01/12/2021   Age-related physical debility 09/20/2020   Allergy, unspecified, initial encounter 09/20/2020   Nausea 09/20/2020   Pain, unspecified 09/20/2020   Pruritus, unspecified 09/20/2020   S/P cardiac cath 08/27/2020   Acute encephalopathy 08/26/2020   Peripheral neuropathy 08/26/2020   Elevated troponin 08/25/2020   Hyperkalemia 08/05/2020   Arthritis    Asthma    Cataract    Cerebral infarction due to thrombosis of cerebral artery (HCC)    History of CVA (cerebrovascular accident)    Chronic kidney disease    Dyspnea    Hyperlipidemia    Hypertension    Kidney failure    Restless leg syndrome    Degenerative lumbar spinal stenosis 29/51/8841   Diastolic dysfunction 66/01/3015   Renovascular hypertension 06/22/2020   Demand ischemia ([place]) 06/18/2020   LVH (left ventricular hypertrophy) due to hypertensive disease, with heart failure ([place]) 06/18/2020   Myofascial pain syndrome 06/12/2020   Encounter for removal of sutures 03/29/2020   History of fusion of cervical spine 03/26/2020   Non-compliance with renal dialysis ([place]) 02/08/2020   Pulmonary edema 01/23/2020   Cervical disc herniation 01/04/2020   Tachycardia 01/04/2020   SOB (shortness of breath)    Hypoxia 12/30/2019   Chronic low back pain 11/24/2019   Degeneration of lumbar intervertebral disc 11/24/2019   Lumbar radiculopathy 11/24/2019   Left shoulder pain 11/11/2019   Anaphylactic shock, unspecified, sequela 06/10/2019   Rib fractures 06/05/2019   Fall at home, initial encounter 06/04/2019   Right rib fracture 06/04/2019   Lung contusion 06/04/2019   Acute respiratory failure  with hypoxia ([place]) 06/04/2019   Anemia in ESRD (end-stage renal disease) ([place]) 06/04/2019   GERD without esophagitis 06/04/2019   Chronic diastolic (congestive) heart failure ([place]) 06/04/2019   Fall 06/04/2019   Thoracic ascending aortic aneurysm 06/04/2019   Acute exacerbation of congestive heart failure ([place]) 05/16/2019   Carpal tunnel syndrome of right wrist 10/05/2018   Ulnar neuropathy 10/05/2018   Neuritis of right ulnar nerve 09/13/2018   Pain in joint of right elbow 09/13/2018   Chest pain 04/13/2018   Chronic diastolic heart failure ([place]) 04/13/2018   Acute on chronic diastolic CHF (congestive heart failure) ([place]) 04/13/2018   Atypical chest pain 02/16/2018   ESRD on dialysis ([place]) 02/16/2018   End stage renal disease ([place]) 02/16/2018   CAP (community acquired pneumonia) 10/23/2017   Asthma, chronic, unspecified asthma severity, with acute exacerbation 10/23/2017   Respiratory failure, acute ([place]) 10/23/2017   Pulmonary hypertension ([place]) 10/23/2017   Iron deficiency anemia, unspecified 09/09/2017   Gram-negative sepsis, unspecified ([place]) 09/05/2017   Encounter for immunization 07/08/2017   Moderate protein-calorie malnutrition ([place]) 06/06/2017  Coagulation defect, unspecified ([place]) 06/04/2017   Hypokalemia 06/04/2017   Other hyperlipidemia 06/04/2017   Secondary hyperparathyroidism of renal origin ([place]) 06/04/2017   Macrocytic anemia 05/24/2017   Uremia 16/06/9603   Metabolic acidosis 54/05/8118   Hypertensive urgency 05/24/2017   Acute kidney injury superimposed on CKD ([place]) 05/24/2017   CKD (chronic kidney disease), stage V ([place]) 05/24/2017   GI bleeding 05/24/2017   Anemia of chronic disease 05/24/2017   Stroke ([place]) 2018   AKI (acute kidney injury) ([place]) 04/10/2015   History of completed stroke    Essential hypertension 04/09/2015    Past Surgical History:  Procedure Laterality Date   A/V FISTULAGRAM N/A 01/01/2021   Procedure: A/V FISTULAGRAM;  Surgeon:  Serafina Mitchell, MD;  Location: [place] [facility];  Service: Cardiovascular;  Laterality: N/A;   A/V FISTULAGRAM N/A 01/15/2021   Procedure: A/V [id number];  Surgeon: Serafina Mitchell, MD;  Location: [place] [facility];  Service: Cardiovascular;  Laterality: N/A;   ANTERIOR CERVICAL DECOMP/DISCECTOMY FUSION N/A 01/04/2020   Procedure: ANTERIOR CERVICAL DECOMPRESSION/DISCECTOMY FUSION CERVICAL FIVE THROUGH SEVEN;  Surgeon: Melina Schools, MD;  Location: [place];  Service: Orthopedics;  Laterality: N/A;  3 hrs   AV FISTULA PLACEMENT Left 05/28/2017   Procedure: LEFT ARM ARTERIOVENOUS (AV) FISTULA CREATION;  Surgeon: Conrad Woodford, MD;  Location: [place];  Service: Vascular;  Laterality: Left;   AV FISTULA PLACEMENT Left 02/02/2021   Procedure: Debride left forearm, ligation of left upper arm Aretiovenous fistula;  Surgeon: Elam Dutch, MD;  Location: [place];  Service: Vascular;  Laterality: Left;   AV FISTULA PLACEMENT Right 04/04/2021   Procedure: RIGHT ARTERIOVENOUS (AV) FISTULA CREATION;  Surgeon: Serafina Mitchell, MD;  Location: [place] OR;  Service: Vascular;  Laterality: Right;   BIOPSY  07/10/2021   Procedure: BIOPSY;  Surgeon: Lavena Bullion, DO;  Location: [place] [facility];  Service: Gastroenterology;;   COLONOSCOPY WITH PROPOFOL N/A 07/10/2021   Procedure: COLONOSCOPY WITH PROPOFOL;  Surgeon: Lavena Bullion, DO;  Location: [place];  Service: Gastroenterology;  Laterality: N/A;   ESOPHAGOGASTRODUODENOSCOPY (EGD) WITH PROPOFOL N/A 07/10/2021   Procedure: ESOPHAGOGASTRODUODENOSCOPY (EGD) WITH PROPOFOL;  Surgeon: Lavena Bullion, DO;  Location: [place];  Service: Gastroenterology;  Laterality: N/A;   EYE SURGERY Right 06/02/2019   Cataract removed   FRACTURE SURGERY     INSERTION OF DIALYSIS CATHETER Right 02/02/2021   Procedure: INSERTION OF Right Internal jugular TUNNELED  DIALYSIS CATHETER;  Surgeon: Elam Dutch, MD;  Location: [facility];  Service: Vascular;  Laterality: Right;   IR  DIALY SHUNT INTRO NEEDLE/INTRACATH INITIAL W/IMG LEFT Left 09/12/2020   IR FLUORO GUIDE CV LINE RIGHT  05/25/2017   IR US GUIDE VASC ACCESS LEFT  09/12/2020   IR US GUIDE VASC ACCESS RIGHT  05/25/2017   LIGATION OF ARTERIOVENOUS  FISTULA Left 01/10/2021   Procedure: LIGATION OF LEFT ARM RADIOCEPHALIC FISTULA;  Surgeon: Serafina Mitchell, MD;  Location: [place];  Service: Vascular;  Laterality: Left;   PERIPHERAL VASCULAR BALLOON ANGIOPLASTY Left 01/15/2021   Procedure: PERIPHERAL VASCULAR BALLOON ANGIOPLASTY;  Surgeon: Serafina Mitchell, MD;  Location: [place] [facility];  Service: Cardiovascular;  Laterality: Left;  AVF   REVISON OF ARTERIOVENOUS FISTULA Left 07/18/2019   Procedure: REVISION PLICATION OF RADIOCEPHALIC ARTERIOVENOUS FISTULA LEFT ARM;  Surgeon: Angelia Mould, MD;  Location: [place];  Service: Vascular;  Laterality: Left;   REVISON OF ARTERIOVENOUS FISTULA Left 01/10/2021   Procedure: CONVERSION TO BRACHIOCEPHALIC ARTERIOVENOUS  FISTULA;  Surgeon: Serafina Mitchell, MD;  Location: [facility] OR;  Service: Vascular;  Laterality: Left;   RINOPLASTY         Family History  Problem Relation Age of Onset   Cancer Mother     Social History   Tobacco Use   Smoking status: Never   Smokeless tobacco: Never  Vaping Use   Vaping Use: Never used  Substance Use Topics   Alcohol use: Not Currently    Alcohol/week: 0.0 standard drinks    Comment: has a drink once in a while- 12/30/19   Drug use: No    Home Medications Prior to Admission medications   Medication Sig Start Date End Date Taking? Authorizing Provider  acetaminophen (TYLENOL) 500 MG tablet Take 1,000 mg by mouth every 6 (six) hours as needed for mild pain, moderate pain or headache.    [provider]  albuterol (VENTOLIN HFA) 108 (90 Base) MCG/ACT inhaler Inhale 2 puffs into the lungs 2 (two) times daily as needed for shortness of breath or wheezing. 06/01/21   Oswald Hillock, MD  apixaban (ELIQUIS) 2.5 MG TABS tablet  Take 1 tablet (2.5 mg total) by mouth 2 (two) times daily. 07/13/21 08/12/21  Darliss Cheney, MD  BREO ELLIPTA 100-25 MCG/INH AEPB Inhale 1 puff into the lungs daily. Patient taking differently: Inhale 1 puff into the lungs daily as needed (shortness of breath). 06/01/21 07/31/21  Oswald Hillock, MD  cinacalcet (SENSIPAR) 60 MG tablet Take 1 tablet (60 mg total) by mouth every evening. 06/01/21   Oswald Hillock, MD  ferric citrate (AURYXIA) 1 GM 210 MG(Fe) tablet Take 210 mg by mouth See admin instructions. Take one tablet (210 mg) by mouth occasionally    [provider]  furosemide (LASIX) 80 MG tablet Take 1 tablet (80 mg total) by mouth daily as needed for fluid (Use only on non-HD days (Tues/Thur/Sat/Sun)). Patient taking differently: Take 80 mg by mouth every morning. 06/01/21   Oswald Hillock, MD  metoprolol tartrate (LOPRESSOR) 50 MG tablet Take 1 tablet (50 mg total) by mouth 2 (two) times daily. 06/01/21   Oswald Hillock, MD  midodrine (PROAMATINE) 10 MG tablet Take 1 tablet (10 mg total) by mouth 3 (three) times daily with meals. Patient taking differently: Take 10 mg by mouth 2 (two) times daily. 07/05/21 08/04/21  Antonieta Pert, MD  multivitamin (RENA-VIT) TABS tablet Take 1 tablet by mouth at bedtime. 01/12/18   [provider]  sevelamer carbonate (RENVELA) 800 MG tablet Take 800-1,600 mg by mouth See admin instructions. Take 1-2 tablets [phone] mg) by mouth three times daily with meals, take 1 tablet (800 mg) with snacks 05/31/21   [provider]    Allergies    Vancomycin  Review of Systems   Review of Systems  Constitutional:  Negative for chills and fever.  HENT:  Negative for ear pain and sore throat.   Eyes:  Negative for pain and visual disturbance.  Respiratory:  Positive for shortness of breath. Negative for cough.   Cardiovascular:  Positive for chest pain and leg swelling. Negative for palpitations.  Gastrointestinal:  Negative for abdominal pain and  vomiting.  Genitourinary:  Negative for frequency and hematuria.  Musculoskeletal:  Positive for back pain. Negative for arthralgias.  Skin:  Positive for rash. Negative for color change.       Per patient "bed sores"  Neurological:  Positive for weakness. Negative for seizures and syncope.  All other systems reviewed  and are negative.  Physical Exam Updated Vital Signs BP 117/82 (BP Location: Left Arm)   Pulse (!) 125   Temp 97.8 F (36.6 C) (Oral)   Resp 15   SpO2 98%   Physical Exam Vitals and nursing note reviewed.  Constitutional:      Appearance: He is well-developed.  HENT:     Head: Normocephalic and atraumatic.  Eyes:     Conjunctiva/sclera: Conjunctivae normal.  Cardiovascular:     Rate and Rhythm: Regular rhythm. Tachycardia present.     Heart sounds: No murmur heard. Pulmonary:     Effort: Pulmonary effort is normal. No respiratory distress.     Breath sounds: Decreased breath sounds present.  Abdominal:     Palpations: Abdomen is soft.     Tenderness: There is no abdominal tenderness.  Musculoskeletal:     Cervical back: Neck supple.     Right lower leg: Edema present.     Left lower leg: Edema present.     Comments: Bilateral pitting edema  Skin:    General: Skin is warm and dry.  Neurological:     Mental Status: He is alert.   ED Results / Procedures / Treatments   Labs (all labs ordered are listed, but only abnormal results are displayed) Labs Reviewed  BASIC METABOLIC PANEL - Abnormal; Notable for the following components:      Result Value   CO2 15 (*)    BUN 58 (*)    Creatinine, Ser 10.28 (*)    GFR, Estimated 5 (*)    Anion gap 18 (*)    All other components within normal limits  CBC - Abnormal; Notable for the following components:   RBC 2.79 (*)    Hemoglobin 9.4 (*)    HCT 31.4 (*)    MCV 112.5 (*)    MCHC 29.9 (*)    RDW 23.9 (*)    Platelets 122 (*)    nRBC 0.3 (*)    All other components within normal limits  TROPONIN I  (HIGH SENSITIVITY) - Abnormal; Notable for the following components:   Troponin I (High Sensitivity) 74 (*)    All other components within normal limits  TROPONIN I (HIGH SENSITIVITY)    EKG None  Radiology DG Chest 2 View  Result Date: 07/16/2021 CLINICAL DATA:  Chest pain EXAM: CHEST - 2 VIEW COMPARISON:  07/07/2021 FINDINGS: Interval removal of hemodialysis catheter. Stable cardiomegaly. Aortic atherosclerosis. Pulmonary vascular congestion with predominantly interstitial opacities throughout both lungs. Small right pleural effusion. No pneumothorax. IMPRESSION: Findings suggestive of CHF with pulmonary edema and small right pleural effusion. Electronically Signed   By: Davina Poke D.O.   On: 07/16/2021 09:55    Procedures Procedures   Medications Ordered in ED Medications - No data to display  ED Course  I have reviewed the triage vital signs and the nursing notes.  Pertinent labs & imaging results that were available during my care of the patient were reviewed by me and considered in my medical decision making (see chart for details).  Clinical Course as of 07/16/21 1444  Tue Jul 16, 2021  1443 Dr. Jonnie Finner we are going to to ED dialysis (dialysis and then back to ED and home) assuming this AVF works. If it doesn't work we will need to have him admitted for IR [facility] tomorrow.  [HS]    Clinical Course User Index [HS] Sherrill Raring, PA-C   MDM Rules/Calculators/A&P  Patient is tachycardic, suspect atrial flutter although sinus tach is a possibility.    He has elevated creatinine likely secondary to not having any dialysis.  Troponin is improved from 2 weeks ago, suspect likely to not having dialysis.  There is a question of the patient's goals and care.  The fistula was removed due to 49-month life expectancy.  Nephrology is aware the patient and follows them.  I have consulted nephrology and Dr. Melvia Heaps is taken to attempt IR TDC.   If this does not  work patient will need to be admitted for IR [place].  Disposition pending nephrology recommendations.  Appreciate nephrology consult. The fistula did not work and he advises hospital admit. He will consult radiology for tunnel catheter tomorrow.  Appreciate his consultation.    Final Clinical Impression(s) / ED Diagnoses Final diagnoses:  None    Rx / DC Orders ED Discharge Orders     None        Sherrill Raring, Hershal Coria 07/16/21 1528    Valarie Merino, MD 07/20/21 1610

## 2021-07-16 NOTE — ED Triage Notes (Signed)
EMS stated, he is a dialysis pt , and fistulae was removed for unknown reason, Only has about 6 months to live. Pt. Has chest pain and retaining fluid. He also has hemorrhoids and bed sores.

## 2021-07-16 NOTE — Procedures (Signed)
Mr Pinder arrived from the ED in stable condition.  Right Upper arm fistula was noted.  Thrill and bruit present.  CCHT attempted to access fistula with 17 gauge needles and immediately his fistula infiltrated.  Upon discussion, pt stated he had not been using his stress ball to mature his fistula.  [place] MD was notified and order received to return patient to the ED for further evaluation.  Dialysis treatment cancelled for today.

## 2021-07-16 NOTE — ED Triage Notes (Signed)
Given ASA 324 mg. Po. - 836

## 2021-07-16 NOTE — ED Provider Notes (Signed)
Emergency Medicine Provider Triage Evaluation Note  Thomas Robinson , a 60 y.o. male  was evaluated in triage.  Pt complains of chest pain and "pain all over" since yesterday.  Patient is a dialysis patient, and had his fistula removed last week for unknown reason.  Last dialysis treatment was last week.  States he only has about 6 months to live.  States that he was supposed to check in to the heart doctor today for "sudden arrhythmia right".  He is unable to give further details.  States that he is having chest pain and retaining fluids.  He is also complaining of hemorrhoids and bedsores," feeling like his skin is sticking to his pants".  He was given 324 aspirin in route, and is chronically on 2 L of oxygen.  Review of Systems  Positive: Chest pain, shortness of breath Negative: Fevers, chills  Physical Exam  BP 115/90 (BP Location: Left Arm)   Pulse (!) 125   Temp 97.9 F (36.6 C)   Resp 20   SpO2 97%  Gen:   Awake, no distress   Resp:  Normal effort  MSK:   Moves extremities without difficulty  Other:  Chronically ill appearing  Medical Decision Making  Medically screening exam initiated at 9:21 AM.  Appropriate orders placed.  [name] was informed that the remainder of the evaluation will be completed by another provider, this initial triage assessment does not replace that evaluation, and the importance of remaining in the ED until their evaluation is complete.     Estill Cotta 07/16/21 0814    Regan Lemming, MD 07/16/21 1510

## 2021-07-16 NOTE — ED Triage Notes (Signed)
2 L of Oxygen

## 2021-07-16 NOTE — ED Notes (Signed)
Pt very upset about situation. Refusing all medications. This RN is able to convince Pt to take metoprolol, midrodrine, and cephalexin. Rogers Blocker MD notified.

## 2021-07-16 NOTE — ED Notes (Addendum)
Fistula bleeding appears to be controlled at this time. Dressed with gauze. SOB, slightly worse than baseline, on 3LPM (= to home O2), swelling in both legs. Pt comfortable and cooperative. Placed on cardiac monitor.

## 2021-07-16 NOTE — H&P (Addendum)
History and Physical    Nayan Proch WLN:[id number] DOB: 1961/03/21 DOA: 07/16/2021  PCP: [name], PA Consultants:  cardiology: Dr. Harriet Masson, nephrology: Dr. Jonnie Finner,  Patient coming from:  Home - lives with a friend  Chief Complaint: chest pain  HPI: Thomas Robinson is a 60 y.o. male with medical history significant of  Chronic hypoxemic respiratory failure on 2.5 L via nasal cannula, chronic diastolic CHF, ESRD on HD MWF, thoracic aorta aneurysm, atrial flutter on Eliquis, chronic anemia, secondary hyperparathyroidism, hypertension and hyperlipidemia who presented to Ed with 5 day history of feeling bad since leaving hospital on 07/11/21 AMA for GI bleed  and chest pain that has progressively gotten worse that prompted visit to ED. His last dialysis session was 07/10/21. He had his catheter removed yesterday. History is difficult, very tangential in speech. Chest pain has resolved. He has not been taking any of his medication at home.   Recently admitted on 11/6 and then left AMA on 07/11/2021.  Was put on hospice last week, but has changed his mind and would like to live. He tells me he never wanted to be on hospice. He had GI bleed at last admit and underwent EGD/colonoscopy 07/10/21. Found to have internal hemorrhoids, barotrauma, gastritis but no active bleeding. GI recommended protonix 40mg  BID x 6 weeks which he has not taken and resume his eliquis on 11/12 for his atrial flutter which  he has not done.   He reports chills, no recorded fevers, headaches intermittently, no vision loss, +palpitations, +shortness of breath and cough that is progressively getting worse, denies stomach pain, has had some nausea, no vomiting. He has had some episodes of diarrhea. He has significant swelling in his legs. He has been eating and drinking.   He does not smoke or drink.    ED Course: vitals: Afebrile, blood pressure 115/90, heart rate 125, respiratory rate 20, oxygen 97% on 3 L nasal  cannula Pertinent labs: Hemoglobin 9.4, platelets 122, CO2 15, BUN 58, creatinine 10.28, troponin 74>75 Chest x-ray findings suggestive of CHF with pulmonary edema and small right pleural effusion. ED nephrology was consulted for asked to admit.  Review of Systems: As per HPI; otherwise review of systems reviewed and negative.   Ambulatory Status:  Ambulates with walker    Past Medical History:  Diagnosis Date   Anaphylactic shock, unspecified, sequela 06/10/2019   Aortic stenosis    mild-moderate AS 12/2020 echo   Arthritis    Asthma, chronic, unspecified asthma severity, with acute exacerbation 10/23/2017   CAP (community acquired pneumonia) 10/23/2017   Carpal tunnel syndrome of right wrist 10/05/2018   Cataract    right - removed by surgery   Cerebral infarction due to thrombosis of cerebral artery (HCC)    Cervical disc herniation 01/04/2020   Chronic diastolic heart failure ([place]) 04/13/2018   Chronic low back pain 11/24/2019   Constipation    COPD (chronic obstructive pulmonary disease) (HCC)    Cough    chronic cough   Dyspnea    Encounter for immunization 07/08/2017   ESRD on hemodialysis ([place]) 02/16/2018   Fall 06/04/2019   GERD (gastroesophageal reflux disease) 06/04/2019   GI bleeding 05/24/2017   Gram-negative sepsis, unspecified ([place]) 09/05/2017   History of fusion of cervical spine 03/26/2020   Hyperlipidemia    Hypertension    Hypokalemia 06/04/2017   Iron deficiency anemia, unspecified 09/09/2017   Left shoulder pain 11/11/2019   Lumbar radiculopathy 11/24/2019   Lung contusion 06/04/2019  LVH (left ventricular hypertrophy) due to hypertensive disease, with heart failure ([place]) 06/18/2020   Macrocytic anemia 05/24/2017   Moderate protein-calorie malnutrition (HCC) 06/06/2017   Myofascial pain syndrome 06/12/2020   Neuritis of right ulnar nerve 09/13/2018   Non-compliance with renal dialysis ([place]) 02/08/2020   Oxygen deficiency 12/30/2019   O2 sats on  RA 87% at PAT appt    Pain in joint of right elbow 09/13/2018   Pulmonary hypertension ([place])    Renovascular hypertension 06/22/2020   Restless leg syndrome    Rib fractures 06/2019   Right   S/P cardiac cath 08/27/2020   normal coronary arteries.   Secondary hyperparathyroidism of renal origin ([place]) 06/04/2017   Thoracic ascending aortic aneurysm    4.5 cm 06/04/19 CT   Ulnar neuropathy 10/05/2018   Volume overload 10/23/2017    Past Surgical History:  Procedure Laterality Date   A/V FISTULAGRAM N/A 01/01/2021   Procedure: A/V FISTULAGRAM;  Surgeon: Serafina Mitchell, MD;  Location: [place] [facility];  Service: Cardiovascular;  Laterality: N/A;   A/V FISTULAGRAM N/A 01/15/2021   Procedure: A/V [id number];  Surgeon: Serafina Mitchell, MD;  Location: [place] [facility];  Service: Cardiovascular;  Laterality: N/A;   ANTERIOR CERVICAL DECOMP/DISCECTOMY FUSION N/A 01/04/2020   Procedure: ANTERIOR CERVICAL DECOMPRESSION/DISCECTOMY FUSION CERVICAL FIVE THROUGH SEVEN;  Surgeon: Melina Schools, MD;  Location: [place];  Service: Orthopedics;  Laterality: N/A;  3 hrs   AV FISTULA PLACEMENT Left 05/28/2017   Procedure: LEFT ARM ARTERIOVENOUS (AV) FISTULA CREATION;  Surgeon: Conrad Malaga, MD;  Location: [place];  Service: Vascular;  Laterality: Left;   AV FISTULA PLACEMENT Left 02/02/2021   Procedure: Debride left forearm, ligation of left upper arm Aretiovenous fistula;  Surgeon: Elam Dutch, MD;  Location: [place];  Service: Vascular;  Laterality: Left;   AV FISTULA PLACEMENT Right 04/04/2021   Procedure: RIGHT ARTERIOVENOUS (AV) FISTULA CREATION;  Surgeon: Serafina Mitchell, MD;  Location: [place] OR;  Service: Vascular;  Laterality: Right;   BIOPSY  07/10/2021   Procedure: BIOPSY;  Surgeon: Lavena Bullion, DO;  Location: [place] [facility];  Service: Gastroenterology;;   COLONOSCOPY WITH PROPOFOL N/A 07/10/2021   Procedure: COLONOSCOPY WITH PROPOFOL;  Surgeon: Lavena Bullion, DO;  Location: [place];   Service: Gastroenterology;  Laterality: N/A;   ESOPHAGOGASTRODUODENOSCOPY (EGD) WITH PROPOFOL N/A 07/10/2021   Procedure: ESOPHAGOGASTRODUODENOSCOPY (EGD) WITH PROPOFOL;  Surgeon: Lavena Bullion, DO;  Location: [place];  Service: Gastroenterology;  Laterality: N/A;   EYE SURGERY Right 06/02/2019   Cataract removed   FRACTURE SURGERY     INSERTION OF DIALYSIS CATHETER Right 02/02/2021   Procedure: INSERTION OF Right Internal jugular TUNNELED  DIALYSIS CATHETER;  Surgeon: Elam Dutch, MD;  Location: [facility] OR;  Service: Vascular;  Laterality: Right;   IR DIALY SHUNT INTRO NEEDLE/INTRACATH INITIAL W/IMG LEFT Left 09/12/2020   IR FLUORO GUIDE CV LINE RIGHT  05/25/2017   IR US GUIDE VASC ACCESS LEFT  09/12/2020   IR US GUIDE VASC ACCESS RIGHT  05/25/2017   LIGATION OF ARTERIOVENOUS  FISTULA Left 01/10/2021   Procedure: LIGATION OF LEFT ARM RADIOCEPHALIC FISTULA;  Surgeon: Serafina Mitchell, MD;  Location: [place];  Service: Vascular;  Laterality: Left;   PERIPHERAL VASCULAR BALLOON ANGIOPLASTY Left 01/15/2021   Procedure: PERIPHERAL VASCULAR BALLOON ANGIOPLASTY;  Surgeon: Serafina Mitchell, MD;  Location: [place] [facility];  Service: Cardiovascular;  Laterality: Left;  AVF   REVISON OF ARTERIOVENOUS FISTULA Left 07/18/2019  Procedure: REVISION PLICATION OF RADIOCEPHALIC ARTERIOVENOUS FISTULA LEFT ARM;  Surgeon: Angelia Mould, MD;  Location: [facility] OR;  Service: Vascular;  Laterality: Left;   REVISON OF ARTERIOVENOUS FISTULA Left 01/10/2021   Procedure: CONVERSION TO BRACHIOCEPHALIC ARTERIOVENOUS FISTULA;  Surgeon: Serafina Mitchell, MD;  Location: [facility];  Service: Vascular;  Laterality: Left;   RINOPLASTY      Social History   Socioeconomic History   Marital status: Single    Spouse name: Not on file   Number of children: Not on file   Years of education: Not on file   Highest education level: Not on file  Occupational History   Not on file  Tobacco Use   Smoking status: Never    Smokeless tobacco: Never  Vaping Use   Vaping Use: Never used  Substance and Sexual Activity   Alcohol use: Not Currently    Alcohol/week: 0.0 standard drinks    Comment: has a drink once in a while- 12/30/19   Drug use: No   Sexual activity: Not Currently  Other Topics Concern   Not on file  Social History Narrative   Lives with a roommate in a one story home.  Has 1 child.  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  Education: high school.   Social Determinants of Health   Financial Resource Strain: Not on file  Food Insecurity: Not on file  Transportation Needs: Not on file  Physical Activity: Not on file  Stress: Not on file  Social Connections: Not on file  Intimate Partner Violence: Not on file    Allergies  Allergen Reactions   Vancomycin Shortness Of Breath and Itching    Family History  Problem Relation Age of Onset   Cancer Mother     Prior to Admission medications   Medication Sig Start Date End Date Taking? Authorizing Provider  acetaminophen (TYLENOL) 500 MG tablet Take 1,000 mg by mouth every 6 (six) hours as needed for mild pain, moderate pain or headache.    [provider]  albuterol (VENTOLIN HFA) 108 (90 Base) MCG/ACT inhaler Inhale 2 puffs into the lungs 2 (two) times daily as needed for shortness of breath or wheezing. 06/01/21   Oswald Hillock, MD  apixaban (ELIQUIS) 2.5 MG TABS tablet Take 1 tablet (2.5 mg total) by mouth 2 (two) times daily. 07/13/21 08/12/21  Darliss Cheney, MD  BREO ELLIPTA 100-25 MCG/INH AEPB Inhale 1 puff into the lungs daily. Patient taking differently: Inhale 1 puff into the lungs daily as needed (shortness of breath). 06/01/21 07/31/21  Oswald Hillock, MD  cinacalcet (SENSIPAR) 60 MG tablet Take 1 tablet (60 mg total) by mouth every evening. 06/01/21   Oswald Hillock, MD  ferric citrate (AURYXIA) 1 GM 210 MG(Fe) tablet Take 210 mg by mouth See admin instructions. Take one tablet (210 mg) by mouth occasionally    [provider]  furosemide (LASIX) 80 MG tablet Take 1 tablet (80 mg total) by mouth daily as needed for fluid (Use only on non-HD days (Tues/Thur/Sat/Sun)). Patient taking differently: Take 80 mg by mouth every morning. 06/01/21   Oswald Hillock, MD  metoprolol tartrate (LOPRESSOR) 50 MG tablet Take 1 tablet (50 mg total) by mouth 2 (two) times daily. 06/01/21   Oswald Hillock, MD  midodrine (PROAMATINE) 10 MG tablet Take 1 tablet (10 mg total) by mouth 3 (three) times daily with meals. Patient taking differently: Take 10 mg by mouth 2 (two) times daily. 07/05/21  08/04/21  Antonieta Pert, MD  multivitamin (RENA-VIT) TABS tablet Take 1 tablet by mouth at bedtime. 01/12/18   [provider]  sevelamer carbonate (RENVELA) 800 MG tablet Take 800-1,600 mg by mouth See admin instructions. Take 1-2 tablets ([id number] mg) by mouth three times daily with meals, take 1 tablet (800 mg) with snacks 05/31/21   [provider]    Physical Exam: Vitals:   07/16/21 1226 07/16/21 1447 07/16/21 1645 07/16/21 1919  BP: 117/82 109/62 112/67 101/77  Pulse: (!) 125 (!) 123 (!) 124 (!) 126  Resp: 15 17 20 20   Temp: 97.8 F (36.6 C) 97.9 F (36.6 C)  98 F (36.7 C)  TempSrc: Oral Oral  Oral  SpO2: 98% 100% 97% 95%     General:  Appears calm and comfortable and is in NAD. Cachetic  Eyes:  PERRL, EOMI, normal lids, iris ENT:  grossly normal hearing, lips & tongue, mmm; appropriate dentition Neck:  no LAD, masses or thyromegaly; no carotid bruits Cardiovascular:  sinus tachycarida no m/r/g. 3+ pitting edema bilateral lower legs to mid calf  Respiratory:   CTA bilaterally with no wheezes/rales/rhonchi.  Normal respiratory effort. Abdomen:  soft, NT, mildly distended , NABS Back:   normal alignment, no CVAT Skin:  no rash or induration seen on limited exam GU: glans of penis with erythema and white, purulent foul smelling discharge in folds of foreskin  Musculoskeletal:  grossly normal tone BUE/BLE, good  ROM, no bony abnormality Lower extremity:    Limited foot exam with no ulcerations.  2+ distal pulses. Psychiatric:  grossly normal mood and affect, speech fluent and appropriate, AOx3 Neurologic:  CN 2-12 grossly intact, moves all extremities in coordinated fashion, sensation intact    Radiological Exams on Admission: Independently reviewed - see discussion in A/P where applicable  DG Chest 2 View  Result Date: 07/16/2021 CLINICAL DATA:  Chest pain EXAM: CHEST - 2 VIEW COMPARISON:  07/07/2021 FINDINGS: Interval removal of hemodialysis catheter. Stable cardiomegaly. Aortic atherosclerosis. Pulmonary vascular congestion with predominantly interstitial opacities throughout both lungs. Small right pleural effusion. No pneumothorax. IMPRESSION: Findings suggestive of CHF with pulmonary edema and small right pleural effusion. Electronically Signed   By: Davina Poke D.O.   On: 07/16/2021 09:55    EKG: Independently reviewed.  Sinus tachy with rate 125; nonspecific ST changes with no evidence of acute ischemia. He is in atrial flutter.    Labs on Admission: I have personally reviewed the available labs and imaging studies at the time of the admission.  Pertinent labs:  Hemoglobin 9.4, platelets 122,  CO2 15,  BUN 58,  creatinine 10.28,  troponin 74>75  Assessment/Plan Active Problems:   Chest pain 60 year old presenting with multiple complaints including chest pain, SOB, diarrhea with multiple admits and leaving AMA.  Likely multifactorial with fluid overload due to missed dialysis x 1 week, atrial flutter.  -troponin flat and at baseline, chest pain has resolved. Ekg similar to past and no ST changes. Remains in atrial flutter  -dialysis plans for tomorrow: see below  -will admit to telemetry.  -re start his metoprolol, hold eliquis with procedure tomorrow (cleared to start on 11/12 by GI, he has not started)    Shortness of breath with chronic respiratory failure  -secondary to  volume overload from missed dialysis session and likely atrial flutter -plans for dialysis tomorrow once [facility] is placed by IR -start back his metoprolol for atrial flutter, follow rate, may need to see cards -at baseline  oxygen with no new requirement. Some concerns for ILD on last hospital admit possibly contributing to SOB, high resolution CT chest was ordered, but he refused. Consider at this hospitalization.    Atrial flutter ([place]) -recently diagnosed on hospital admit and cardiology was consulted on 11/3.  -they discussed possible TEE/DCCV when recovered from illness, but eliquis was discontinued due to bleed. Recommended f/u with Dr. Jorene Minors in 2-4 weeks.  -he has not started back his eliquis on 11/12 as advised by GI -likely contributing to above symptoms as well.  -troponin flat and consistent with baseline.  -has not taken his metoprolol so will start this back at 25mg  BID and see if rate comes down -cards consult possibly tomorrow  -TED hose, holding eliquis with procedure tomorrow with IR   Balanitis Appears bacterial with foul smelling, purulent discharge -start mupirocin TID and keflex renally adjusted   Diarrhea Complains of multiple episodes of diarrhea in setting of recent abx use GI stool panel pending     ESRD on dialysis [facility]) -was placed on hospice last week and TDC removed for hospice transition. -has not had dialysis since last Wednesday and now wants to live -needs dialysis but no access. IR has been consulted for [facility] with plans for dialysis tomorrow.   -nephrology recommends consulting [facility] in AM for f/u of AVF access.   -metabolic acidosis per nephro     Essential hypertension -running more hypotensive, continue midodrine    Acute on Chronic diastolic heart failure (HCC) Appears to be in acute exacerbation due to missed dialysis sessions with LE edema, shortness of breath and findings on CXR -volume control per dialysis, attempted today but he has no access. IR  has been consulted for new TDC.  -intake/output    History of CVA (cerebrovascular accident) -start back his eliquis after procedures    Thoracic ascending aortic aneurysm 4.6x4.5 cm on CT in September  Needs semiannual imaging and outpatient f/u    Anemia of chronic disease Hgb 9.4, appears stable.    Coccyx stage II ulcer Wound care referral    There is no height or weight on file to calculate BMI.    Level of care: Telemetry Medical DVT prophylaxis:  TED Code Status:  Full - confirmed with patient Family Communication: None present; Disposition Plan:  The patient is from: home  Anticipated d/c is to: home   Requires inpatient hospitalization and is at significant risk of  worsening, requires constant monitoring, procedures for access for HD, assessment and MDM with specialists.    Patient is currently: stable  Consults called: nephrology/IR  Admission status:  inpatient    Orma Flaming MD [facility]   How to contact the [facility] Attending or Consulting provider [place] or covering provider during after hours [place], for this patient?  Check the care team in [facility] and look for a) attending/consulting TRH provider listed and b) the [facility] team listed Log into [email or web address] and use [place]'s universal password to access. If you do not have the password, please contact the hospital operator. Locate the [facility] provider you are looking for under [facility] and page to a number that you can be directly reached. If you still have difficulty reaching the provider, please page the [facility] (Director on Call) for the Hospitalists listed on amion for assistance.   07/16/2021, 7:51 PM

## 2021-07-17 ENCOUNTER — Inpatient Hospital Stay (HOSPITAL_COMMUNITY): Payer: Medicare Other | Admitting: Anesthesiology

## 2021-07-17 ENCOUNTER — Encounter (HOSPITAL_COMMUNITY): Payer: Self-pay | Admitting: Family Medicine

## 2021-07-17 ENCOUNTER — Inpatient Hospital Stay (HOSPITAL_COMMUNITY): Payer: Medicare Other

## 2021-07-17 ENCOUNTER — Other Ambulatory Visit: Payer: Self-pay | Admitting: Physician Assistant

## 2021-07-17 DIAGNOSIS — D638 Anemia in other chronic diseases classified elsewhere: Secondary | ICD-10-CM

## 2021-07-17 DIAGNOSIS — I4892 Unspecified atrial flutter: Secondary | ICD-10-CM | POA: Diagnosis not present

## 2021-07-17 DIAGNOSIS — N481 Balanitis: Secondary | ICD-10-CM | POA: Diagnosis not present

## 2021-07-17 DIAGNOSIS — N186 End stage renal disease: Secondary | ICD-10-CM | POA: Diagnosis not present

## 2021-07-17 HISTORY — PX: IR FLUORO GUIDE CV LINE RIGHT: IMG2283

## 2021-07-17 HISTORY — PX: IR US GUIDE VASC ACCESS RIGHT: IMG2390

## 2021-07-17 LAB — BASIC METABOLIC PANEL
Anion gap: 18 — ABNORMAL HIGH (ref 5–15)
BUN: 61 mg/dL — ABNORMAL HIGH (ref 8–23)
CO2: 15 mmol/L — ABNORMAL LOW (ref 22–32)
Calcium: 8.9 mg/dL (ref 8.9–10.3)
Chloride: 105 mmol/L (ref 98–111)
Creatinine, Ser: 10.45 mg/dL — ABNORMAL HIGH (ref 0.61–1.24)
GFR, Estimated: 5 mL/min — ABNORMAL LOW (ref >60)
Glucose, Bld: 98 mg/dL (ref 70–99)
Potassium: 4.6 mmol/L (ref 3.5–5.1)
Sodium: 138 mmol/L (ref 135–145)

## 2021-07-17 LAB — C DIFFICILE QUICK SCREEN W PCR REFLEX
C Diff antigen: NEGATIVE
C Diff interpretation: NOT DETECTED
C Diff toxin: NEGATIVE

## 2021-07-17 LAB — CBC
HCT: 31.1 % — ABNORMAL LOW (ref 39.0–52.0)
Hemoglobin: 9.5 g/dL — ABNORMAL LOW (ref 13.0–17.0)
MCH: 33.3 pg (ref 26.0–34.0)
MCHC: 30.5 g/dL (ref 30.0–36.0)
MCV: 109.1 fL — ABNORMAL HIGH (ref 80.0–100.0)
Platelets: 136 10*3/uL — ABNORMAL LOW (ref 150–400)
RBC: 2.85 MIL/uL — ABNORMAL LOW (ref 4.22–5.81)
RDW: 22.9 % — ABNORMAL HIGH (ref 11.5–15.5)
WBC: 6.8 10*3/uL (ref 4.0–10.5)
nRBC: 0.3 % — ABNORMAL HIGH (ref 0.0–0.2)

## 2021-07-17 LAB — HEPATIC FUNCTION PANEL
ALT: 65 U/L — ABNORMAL HIGH (ref 0–44)
AST: 16 U/L (ref 15–41)
Albumin: 2.5 g/dL — ABNORMAL LOW (ref 3.5–5.0)
Alkaline Phosphatase: 129 U/L — ABNORMAL HIGH (ref 38–126)
Bilirubin, Direct: 0.4 mg/dL — ABNORMAL HIGH (ref 0.0–0.2)
Indirect Bilirubin: 0.6 mg/dL (ref 0.3–0.9)
Total Bilirubin: 1 mg/dL (ref 0.3–1.2)
Total Protein: 6.1 g/dL — ABNORMAL LOW (ref 6.5–8.1)

## 2021-07-17 LAB — PROTIME-INR
INR: 1.4 — ABNORMAL HIGH (ref 0.8–1.2)
Prothrombin Time: 17.4 seconds — ABNORMAL HIGH (ref 11.4–15.2)

## 2021-07-17 LAB — HEPATITIS B SURFACE ANTIBODY,QUALITATIVE: Hep B S Ab: NONREACTIVE

## 2021-07-17 LAB — HEPATITIS B SURFACE ANTIGEN: Hepatitis B Surface Ag: NONREACTIVE

## 2021-07-17 MED ORDER — LIDOCAINE HCL (PF) 1 % IJ SOLN
INTRAMUSCULAR | Status: DC | PRN
  Administered 2021-07-17: 10 mL

## 2021-07-17 MED ORDER — PROSOURCE PLUS PO LIQD
30.0000 mL | Freq: Two times a day (BID) | ORAL | Status: DC
  Administered 2021-07-17 – 2021-07-26 (×11): 30 mL via ORAL
  Filled 2021-07-17 (×13): qty 30

## 2021-07-17 MED ORDER — GUAIFENESIN-DM 100-10 MG/5ML PO SYRP
10.0000 mL | ORAL_SOLUTION | ORAL | Status: DC | PRN
  Administered 2021-07-17 – 2021-07-18 (×3): 10 mL via ORAL
  Filled 2021-07-17 (×3): qty 10

## 2021-07-17 MED ORDER — MIDAZOLAM HCL 2 MG/2ML IJ SOLN
INTRAMUSCULAR | Status: DC | PRN
  Administered 2021-07-17: 1 mg via INTRAVENOUS

## 2021-07-17 MED ORDER — LIDOCAINE HCL 1 % IJ SOLN
INTRAMUSCULAR | Status: AC
  Filled 2021-07-17: qty 20

## 2021-07-17 MED ORDER — NEPRO/CARBSTEADY PO LIQD
237.0000 mL | Freq: Two times a day (BID) | ORAL | Status: DC
  Administered 2021-07-17 – 2021-07-18 (×2): 237 mL via ORAL

## 2021-07-17 MED ORDER — FENTANYL CITRATE (PF) 100 MCG/2ML IJ SOLN
INTRAMUSCULAR | Status: DC | PRN
  Administered 2021-07-17: 25 ug via INTRAVENOUS

## 2021-07-17 MED ORDER — GUAIFENESIN-DM 100-10 MG/5ML PO SYRP
15.0000 mL | ORAL_SOLUTION | Freq: Once | ORAL | Status: AC
  Administered 2021-07-17: 15 mL via ORAL
  Filled 2021-07-17: qty 15

## 2021-07-17 MED ORDER — GABAPENTIN 300 MG PO CAPS
300.0000 mg | ORAL_CAPSULE | Freq: Once | ORAL | Status: AC
  Administered 2021-07-17: 300 mg via ORAL
  Filled 2021-07-17: qty 1

## 2021-07-17 MED ORDER — FENTANYL CITRATE (PF) 100 MCG/2ML IJ SOLN
INTRAMUSCULAR | Status: AC
  Filled 2021-07-17: qty 2

## 2021-07-17 MED ORDER — CEFAZOLIN SODIUM-DEXTROSE 2-4 GM/100ML-% IV SOLN
2.0000 g | INTRAVENOUS | Status: AC
  Administered 2021-07-17: 2 g via INTRAVENOUS
  Filled 2021-07-17: qty 100

## 2021-07-17 MED ORDER — LIDOCAINE 5 % EX PTCH
1.0000 | MEDICATED_PATCH | CUTANEOUS | Status: DC
  Administered 2021-07-18 – 2021-07-19 (×2): 1 via TRANSDERMAL
  Filled 2021-07-17 (×2): qty 1

## 2021-07-17 MED ORDER — HEPARIN SODIUM (PORCINE) 1000 UNIT/ML IJ SOLN
INTRAMUSCULAR | Status: AC
  Filled 2021-07-17: qty 1

## 2021-07-17 MED ORDER — MIDAZOLAM HCL 2 MG/2ML IJ SOLN
INTRAMUSCULAR | Status: AC
  Filled 2021-07-17: qty 4

## 2021-07-17 NOTE — Progress Notes (Signed)
   07/17/21 0207  Assess: MEWS Score  Temp (!) 97.4 F (36.3 C)  BP 132/87  Pulse Rate (!) 127  Resp 20  SpO2 92 %  O2 Device Nasal Cannula  O2 Flow Rate (L/min) 3 L/min  Assess: MEWS Score  MEWS Temp 0  MEWS Systolic 0  MEWS Pulse 2  MEWS RR 0  MEWS LOC 0  MEWS Score 2  MEWS Score Color Yellow  Assess: if the MEWS score is Yellow or Red  Were vital signs taken at a resting state? Yes  Focused Assessment No change from prior assessment  Early Detection of Sepsis Score *See Row Information* Low  MEWS guidelines implemented *See Row Information* Yes  Treat  MEWS Interventions Other (Comment) (patient awaiting diaylis txt)  Pain Scale 0-10  Pain Score 8  Pain Type Acute pain  Pain Location Leg  Pain Orientation Right;Left  Pain Descriptors / Indicators Aching  Pain Frequency Intermittent  Pain Onset On-going  Take Vital Signs  Increase Vital Sign Frequency  Yellow: Q 2hr X 2 then Q 4hr X 2, if remains yellow, continue Q 4hrs  Escalate  MEWS: Escalate Yellow: discuss with charge nurse/RN and consider discussing with provider and RRT  Notify: Charge Nurse/RN  Name of Charge Nurse/RN Notified Nikki,RN  Date Charge Nurse/RN Notified 07/17/21  Time Charge Nurse/RN Notified 0210  Notify: Provider  Provider Name/Title  (ED, provider already notified prior to patient coming to floor)  Date Provider Notified 07/17/21  Time Provider Notified  (time unknown ED nurse notified MD)  Provider response No new orders

## 2021-07-17 NOTE — Progress Notes (Addendum)
Patient ID: Thomas Robinson, male   DOB: 03-21-1961, 60 y.o.   MRN: [id number]  PROGRESS NOTE    Thomas Robinson  RDE:[id number] DOB: 1961-04-07 DOA: 07/16/2021 PCP: Thomas Niece, PA   Brief Narrative:  60 year old male with history of chronic hypoxic respiratory failure on 2.5 L oxygen via nasal cannula, chronic diastolic CHF, end-stage renal disease on hemodialysis, thoracic aortic aneurysm, atrial flutter on Eliquis, chronic anemia, secondary hyperparathyroidism, hypertension, hyperlipidemia, recent admission from 07/07/2021 and patient left AMA on 07/11/2021 for GI bleeding needing EGD/colonoscopy on 07/10/2021 which showed internal hemorrhoids, barotrauma, gastritis but no active bleeding and GI recommended Protonix 40 mg twice daily x6 weeks and to resume Eliquis from 07/13/2021 onwards which patient has not done so far.  His last dialysis session was 07/10/2021.  Before signing out AMA, he had contemplated home hospice but wanted to remain full code; tunneled dialysis catheter was removed prior to him signing out [name].  He presented again on 07/16/2021 with chest pain.  On presentation, he had troponins of 74 and 75; chest x-ray findings suggestive of CHF with pulmonary edema and small right-sided pleural effusion.  Nephrology was consulted.  Assessment & Plan:   Possible volume overload/pulmonary edema secondary to missed dialysis End-stage renal disease on hemodialysis Chest pain: Possibly from above -Last hemodialysis was on 07/10/2021.  Presented with chest pain and shortness of breath because of volume overload.  Nephrology following. -Tunneled catheter was removed last week before patient signed out [name].  IR has been reconsulted for [name] placement.  Dialysis as per nephrology schedule -Troponins did not trend up.  EKG did not show significant ST elevation/depression -Patient was contemplating home hospice during last hospitalization but currently wants to continue  hemodialysis. -Palliative care consultation for goals of care discussion.  Paroxysmal atrial flutter -Diagnosed during last hospitalization.  Cardiology had recommended TEE/DCCV when recovered from illness: Possibly as an outpatient in 2 to 4 weeks -Patient has not started back his Eliquis on 07/13/2021 as advised by GI. -Currently still tachycardic.  Cardiology had recommended to increase metoprolol to 50 mg twice a day during hospitalization. -Eliquis on hold for possible IR procedure  Balanitis -Continue renally dosed Keflex and topical mupirocin  Diarrhea -Check GI panel PCR and stool for C. Difficile  Acute on chronic diastolic heart failure -Volume being managed by dialysis.  Strict input and output.  Daily weights.  Fluid restriction.  History of unspecified CVA -Resume Eliquis once procedures have been completed  Thoracic ascending aortic aneurysm -On CT in September 2022.  Needs semiannual imaging and outpatient follow-up  Anemia of chronic disease -Hemoglobin stable.  From renal failure.  Coccygeal stage II pressure ulcer: Present on admission -Follow wound care consultation recommendations    DVT prophylaxis: SCDs Code Status: Full Family Communication: None at bedside Disposition Plan: Status is: Inpatient  Remains inpatient appropriate because: Of need for resumption of hemodialysis  Consultants: Nephrology/IR.  Consult palliative care  Procedures: None  Antimicrobials:  Anti-infectives (From admission, onward)    Start     Dose/Rate Route Frequency Ordered Stop   07/17/21 0730  ceFAZolin (ANCEF) IVPB 2g/100 mL premix        2 g 200 mL/hr over 30 Minutes Intravenous To Radiology 07/17/21 0634 07/17/21 0920   07/16/21 2200  cephALEXin (KEFLEX) capsule 500 mg       Note to Pharmacy: If need to do q 24 hours that is fine too   500 mg Oral Every 12 hours 07/16/21 1945  07/26/21 2159        Subjective: Patient seen and examined at bedside.  Poor  historian.  Denies worsening shortness breath, chest pain, nausea or vomiting.  Objective: Vitals:   07/17/21 0925 07/17/21 0930 07/17/21 0935 07/17/21 1023  BP: (!) 88/70 (!) 80/65 92/68 100/71  Pulse: (!) 116 (!) 115 (!) 117 (!) 117  Resp: 15 15 15 14   Temp:    98.2 F (36.8 C)  TempSrc:      SpO2: 90% 93% 96% 95%  Weight:      Height:        Intake/Output Summary (Last 24 hours) at 07/17/2021 1113 Last data filed at 07/17/2021 0220 Gross per 24 hour  Intake 0 ml  Output --  Net 0 ml   Filed Weights   07/17/21 0213  Weight: 60.8 kg    Examination:  General exam: Appears calm and comfortable but looks chronically ill and deconditioned.  Currently on 4 L oxygen by nasal cannula. Respiratory system: Bilateral decreased breath sounds at bases with scattered crackles Cardiovascular system: S1 & S2 heard, tachycardic Gastrointestinal system: Abdomen is nondistended, soft and nontender. Normal bowel sounds heard. Extremities: No cyanosis, clubbing; trace lower extremity edema present Central nervous system: Alert and oriented.  Slow to respond.  Extremely poor historian.  No focal neurological deficits. Moving extremities Skin: No obvious ecchymosis/lesions Psychiatry: Affect is mostly flat.  Does not participate in conversation much.   Data Reviewed: I have personally reviewed following labs and imaging studies  CBC: Recent Labs  Lab 07/16/21 0922 07/17/21 0232  WBC 6.5 6.8  HGB 9.4* 9.5*  HCT 31.4* 31.1*  MCV 112.5* 109.1*  PLT 122* 379*   Basic Metabolic Panel: Recent Labs  Lab 07/16/21 0922 07/17/21 0232  NA 140 138  K 4.6 4.6  CL 107 105  CO2 15* 15*  GLUCOSE 99 98  BUN 58* 61*  CREATININE 10.28* 10.45*  CALCIUM 9.2 8.9   GFR: Estimated Creatinine Clearance: 6.3 mL/min (A) (by C-G formula based on SCr of 10.45 mg/dL (H)). Liver Function Tests: Recent Labs  Lab 07/17/21 0232  AST 16  ALT 65*  ALKPHOS 129*  BILITOT 1.0  PROT 6.1*  ALBUMIN  2.5*   No results for input(s): LIPASE, AMYLASE in the last 168 hours. No results for input(s): AMMONIA in the last 168 hours. Coagulation Profile: Recent Labs  Lab 07/17/21 0648  INR 1.4*   Cardiac Enzymes: No results for input(s): CKTOTAL, CKMB, CKMBINDEX, TROPONINI in the last 168 hours. BNP (last 3 results) No results for input(s): PROBNP in the last 8760 hours. HbA1C: No results for input(s): HGBA1C in the last 72 hours. CBG: No results for input(s): GLUCAP in the last 168 hours. Lipid Profile: No results for input(s): CHOL, HDL, LDLCALC, TRIG, CHOLHDL, LDLDIRECT in the last 72 hours. Thyroid Function Tests: No results for input(s): TSH, T4TOTAL, FREET4, T3FREE, THYROIDAB in the last 72 hours. Anemia Panel: No results for input(s): VITAMINB12, FOLATE, FERRITIN, TIBC, IRON, RETICCTPCT in the last 72 hours. Sepsis Labs: No results for input(s): PROCALCITON, LATICACIDVEN in the last 168 hours.  Recent Results (from the past 240 hour(s))  Blood Culture (routine x 2)     Status: None   Collection Time: 07/07/21  8:25 PM   Specimen: BLOOD  Result Value Ref Range Status   Specimen Description   Final    BLOOD LEFT ANTECUBITAL Performed at [facility] [address], [place], [place] [address]    Special  Requests   Final    BOTTLES DRAWN AEROBIC ONLY Blood Culture adequate volume Performed at [facility] [address], [place], [place] [address]    Culture   Final    NO GROWTH 5 DAYS Performed at [facility], [place] [address] [place], [place] [address]    Report Status 07/13/2021 FINAL  Final  Resp Panel by RT-PCR (Flu A&B, Covid) Nasopharyngeal Swab     Status: None   Collection Time: 07/07/21  8:52 PM   Specimen: Nasopharyngeal Swab; Nasopharyngeal(NP) swabs in vial transport medium  Result Value Ref Range Status   SARS Coronavirus 2 by RT PCR NEGATIVE NEGATIVE Final    Comment: (NOTE) SARS-CoV-2 target nucleic acids  are NOT DETECTED.  The SARS-CoV-2 RNA is generally detectable in upper respiratory specimens during the acute phase of infection. The lowest concentration of SARS-CoV-2 viral copies this assay can detect is 138 copies/mL. A negative result does not preclude SARS-Cov-2 infection and should not be used as the sole basis for treatment or other patient management decisions. A negative result may occur with  improper specimen collection/handling, submission of specimen other than nasopharyngeal swab, presence of viral mutation(s) within the areas targeted by this assay, and inadequate number of viral copies(<138 copies/mL). A negative result must be combined with clinical observations, patient history, and epidemiological information. The expected result is Negative.  Fact Sheet for Patients:  EntrepreneurPulse.com.au  Fact Sheet for Healthcare Providers:  IncredibleEmployment.be  This test is no t yet approved or cleared by the [place] FDA and  has been authorized for detection and/or diagnosis of SARS-CoV-2 by FDA under an Emergency Use Authorization (EUA). This EUA will remain  in effect (meaning this test can be used) for the duration of the COVID-19 declaration under Section 564(b)(1) of the Act, 21 U.S.C.section 360bbb-3(b)(1), unless the authorization is terminated  or revoked sooner.       Influenza A by PCR NEGATIVE NEGATIVE Final   Influenza B by PCR NEGATIVE NEGATIVE Final    Comment: (NOTE) The Xpert Xpress SARS-CoV-2/FLU/RSV plus assay is intended as an aid in the diagnosis of influenza from Nasopharyngeal swab specimens and should not be used as a sole basis for treatment. Nasal washings and aspirates are unacceptable for Xpert Xpress SARS-CoV-2/FLU/RSV testing.  Fact Sheet for Patients: EntrepreneurPulse.com.au  Fact Sheet for Healthcare Providers: IncredibleEmployment.be  This test is  not yet approved or cleared by the [place] FDA and has been authorized for detection and/or diagnosis of SARS-CoV-2 by FDA under an Emergency Use Authorization (EUA). This EUA will remain in effect (meaning this test can be used) for the duration of the COVID-19 declaration under Section 564(b)(1) of the Act, 21 U.S.C. section 360bbb-3(b)(1), unless the authorization is terminated or revoked.  Performed at [facility], [place] [address], [place], [place] [address]   Blood Culture (routine x 2)     Status: None   Collection Time: 07/08/21  6:19 AM   Specimen: BLOOD LEFT ARM  Result Value Ref Range Status   Specimen Description BLOOD LEFT ARM  Final   Special Requests   Final    BOTTLES DRAWN AEROBIC AND ANAEROBIC Blood Culture adequate volume   Culture   Final    NO GROWTH 5 DAYS Performed at [facility], [address], [place],  [address]    Report Status 07/13/2021 FINAL  Final  Resp Panel by RT-PCR (Flu A&B, Covid) Nasopharyngeal Swab  Status: None   Collection Time: 07/16/21  7:42 PM   Specimen: Nasopharyngeal Swab; Nasopharyngeal(NP) swabs in vial transport medium  Result Value Ref Range Status   SARS Coronavirus 2 by RT PCR NEGATIVE NEGATIVE Final    Comment: (NOTE) SARS-CoV-2 target nucleic acids are NOT DETECTED.  The SARS-CoV-2 RNA is generally detectable in upper respiratory specimens during the acute phase of infection. The lowest concentration of SARS-CoV-2 viral copies this assay can detect is 138 copies/mL. A negative result does not preclude SARS-Cov-2 infection and should not be used as the sole basis for treatment or other patient management decisions. A negative result may occur with  improper specimen collection/handling, submission of specimen other than nasopharyngeal swab, presence of viral mutation(s) within the areas targeted by this assay, and inadequate number of viral copies(<138 copies/mL). A negative result  must be combined with clinical observations, patient history, and epidemiological information. The expected result is Negative.  Fact Sheet for Patients:  EntrepreneurPulse.com.au  Fact Sheet for Healthcare Providers:  IncredibleEmployment.be  This test is no t yet approved or cleared by the [place] FDA and  has been authorized for detection and/or diagnosis of SARS-CoV-2 by FDA under an Emergency Use Authorization (EUA). This EUA will remain  in effect (meaning this test can be used) for the duration of the COVID-19 declaration under Section 564(b)(1) of the Act, 21 U.S.C.section 360bbb-3(b)(1), unless the authorization is terminated  or revoked sooner.       Influenza A by PCR NEGATIVE NEGATIVE Final   Influenza B by PCR NEGATIVE NEGATIVE Final    Comment: (NOTE) The Xpert Xpress SARS-CoV-2/FLU/RSV plus assay is intended as an aid in the diagnosis of influenza from Nasopharyngeal swab specimens and should not be used as a sole basis for treatment. Nasal washings and aspirates are unacceptable for Xpert Xpress SARS-CoV-2/FLU/RSV testing.  Fact Sheet for Patients: EntrepreneurPulse.com.au  Fact Sheet for Healthcare Providers: IncredibleEmployment.be  This test is not yet approved or cleared by the [place] FDA and has been authorized for detection and/or diagnosis of SARS-CoV-2 by FDA under an Emergency Use Authorization (EUA). This EUA will remain in effect (meaning this test can be used) for the duration of the COVID-19 declaration under Section 564(b)(1) of the Act, 21 U.S.C. section 360bbb-3(b)(1), unless the authorization is terminated or revoked.  Performed at [facility], [place] [address] [place], [place] [address]          Radiology Studies: DG Chest 2 View  Result Date: 07/16/2021 CLINICAL DATA:  Chest pain EXAM: CHEST - 2 VIEW COMPARISON:  07/07/2021 FINDINGS:  Interval removal of hemodialysis catheter. Stable cardiomegaly. Aortic atherosclerosis. Pulmonary vascular congestion with predominantly interstitial opacities throughout both lungs. Small right pleural effusion. No pneumothorax. IMPRESSION: Findings suggestive of CHF with pulmonary edema and small right pleural effusion. Electronically Signed   By: Davina Poke D.O.   On: 07/16/2021 09:55        Scheduled Meds:  cephALEXin  500 mg Oral Q12H   Chlorhexidine Gluconate Cloth  6 each Topical Q0600   cinacalcet  60 mg Oral QPM   heparin sodium (porcine)       lidocaine  1 patch Transdermal Q24H   lidocaine       metoprolol tartrate  25 mg Oral BID   midodrine  10 mg Oral BID   multivitamin  1 tablet Oral QHS   mupirocin ointment   Topical TID   nystatin   Topical BID   pantoprazole  40  mg Oral BID   sodium chloride flush  3 mL Intravenous Q12H   Continuous Infusions:  sodium chloride            Aline August, MD [facility] 07/17/2021, 11:13 AM

## 2021-07-17 NOTE — Progress Notes (Signed)
   07/17/21 1023  Vitals  Temp 98.2 F (36.8 C)  BP 100/71  MAP (mmHg) 82  BP Location Left Arm  BP Method Automatic  Patient Position (if appropriate) Lying  Pulse Rate (!) 117  Pulse Rate Source Monitor  Resp 14  Level of Consciousness  Level of Consciousness Alert  MEWS COLOR  MEWS Score Color Yellow  Oxygen Therapy  SpO2 95 %  Pain Assessment  Pain Score 0  MEWS Score  MEWS Temp 0  MEWS Systolic 1  MEWS Pulse 2  MEWS RR 0  MEWS LOC 0  MEWS Score 3  Provider Notification  Provider Name/Title Dr Starla Link  Date Provider Notified 07/17/21  Time Provider Notified 1000  Notification Type Rounds  Provider response No new orders  Date of Provider Response 07/17/21  Time of Provider Response [id number]

## 2021-07-17 NOTE — Plan of Care (Signed)
  Problem: Education: Goal: Knowledge of disease and its progression will improve Outcome: Adequate for Discharge   

## 2021-07-17 NOTE — Consult Note (Addendum)
[facility] Nurse Consult Note: Reason for Consult: Consult requested for buttocks Wound type: Affected area across bilat buttocks and inner groin folds is approx 5X5X.1cm, red, moist, painful and macerated with loose peeling skin surrounding the edges.  Appearance is consistent with moisture associated skin damage, Pt states he previously had diarrhea stools which have resolved. This is not a pressure injury. Dressing procedure/placement/frequency: Topical treatment orders provided for bedside nurses to perform as follows to promote healing: Apply barrier cream and antifungal powder to buttocks/perineum TID and PRN when turning or cleaning. Please re-consult if further assistance is needed.  Thank-you,  Julien Girt MSN, [place], [place], [place], [place]

## 2021-07-17 NOTE — Procedures (Signed)
   I was present at this dialysis session, have reviewed the session itself and made  appropriate changes [name] MD [place] pager [phone]   07/17/2021, 1:04 PM

## 2021-07-17 NOTE — Progress Notes (Signed)
MD notified that patient is having sharp pain in chest after coughing. Patient would like cough syrup ordered. MD ordered Robitussin DM.

## 2021-07-17 NOTE — Progress Notes (Signed)
Initial Nutrition Assessment  DOCUMENTATION CODES:   Not applicable  INTERVENTION:  -Nepro Shake po BID, each supplement provides 425 kcal and 19 grams protein -PROSource PLUS PO 76mls BID, each supplement provides 100 kcals and 15 grams of protein -renavite daily  NUTRITION DIAGNOSIS:   Increased nutrient needs related to chronic illness (ESRD on HD) as evidenced by estimated needs.  GOAL:   Patient will meet greater than or equal to 90% of their needs  MONITOR:   PO intake, Supplement acceptance, Weight trends, Labs, I & O's  REASON FOR ASSESSMENT:   Malnutrition Screening Tool    ASSESSMENT:   Pt admitted with possible volume overload/pulmonary edema 2/2 missed HD. PMH includes chronic hypoxic respiratory failure on 2.5L O2 baseline, CHF, ESRD on HD, thoracic aortic aneurysm, atrial flutter on Eliquis, chronic anemia, secondary hyperparathyroidism, HTN, HLD, recent admission from 07/07/2021 and patient left AMA on 07/11/2021 for GI bleeding needing EGD/colonoscopy on 07/10/2021 which showed internal hemorrhoids, barotrauma, gastritis but no active bleeding and GI recommended Protonix 40 mg twice daily x6 weeks and to resume Eliquis from 07/13/2021 onwards which patient has not done so far.  Pt's last HD session was 07/10/2021.  Before signing out AMA, pt had contemplated home hospice but wanted to remain full code; tunneled dialysis catheter was removed prior to him signing out [place].  He presented again on 07/16/2021 with chest pain.  On presentation, he had troponins of 74 and 75; chest x-ray findings suggestive of CHF with pulmonary edema and small right-sided pleural effusion.  Nephrology was consulted.  Pt unavailable at time of RD visit. Pt in HD. Per RN, pt received new TDC per IR this morning.   Weight history reviewed. Unable to get clear idea of weight history without further pt discussions; however, pt's wt appears relatively stable over the last 5-6 months per available  weight readings. Will attempt to obtain diet/wt history and NFPE at follow-up.   No PO Intake documented   EDW not listed in nephrology notes and unable to find in care everywhere. RD to discuss with pt at follow-up.   No UOP documented x24 hours  Edema: deep pitting edema to BLE per RN assessment  Medications:  (feeding supplement) PROSource Plus  30 mL Oral BID BM   cephALEXin  500 mg Oral Q12H   Chlorhexidine Gluconate Cloth  6 each Topical Q0600   cinacalcet  60 mg Oral QPM   feeding supplement (NEPRO CARB STEADY)  237 mL Oral BID BM   heparin sodium (porcine)       lidocaine  1 patch Transdermal Q24H   lidocaine       metoprolol tartrate  25 mg Oral BID   midodrine  10 mg Oral BID   multivitamin  1 tablet Oral QHS   mupirocin ointment   Topical TID   nystatin   Topical BID   pantoprazole  40 mg Oral BID   sodium chloride flush  3 mL Intravenous Q12H   Labs: Recent Labs  Lab 07/16/21 0922 07/17/21 0232  NA 140 138  K 4.6 4.6  CL 107 105  CO2 15* 15*  BUN 58* 61*  CREATININE 10.28* 10.45*  CALCIUM 9.2 8.9  GLUCOSE 99 98   NUTRITION - FOCUSED PHYSICAL EXAM: Unable to complete at this time as pt is out of room. Will attempt at follow-up.   Diet Order:   Diet Order             Diet renal with fluid  restriction Fluid restriction: 1200 mL Fluid; Room service appropriate? Yes; Fluid consistency: Thin  Diet effective now                   EDUCATION NEEDS:   No education needs have been identified at this time  Skin:  Skin Assessment: Reviewed RN Assessment  Last BM:  11/14  Height:   Ht Readings from Last 1 Encounters:  07/17/21 5\' 8"  (1.727 m)    Weight:   Wt Readings from Last 10 Encounters:  07/17/21 61.9 kg  07/11/21 57.6 kg  07/04/21 57.8 kg  05/31/21 59.4 kg  05/19/21 58.7 kg  03/29/21 56.7 kg  03/25/21 56.7 kg  03/06/21 61.2 kg  02/17/21 56 kg  02/03/21 56.5 kg    BMI:  Body mass index is 20.75 kg/m.  Estimated Nutritional  Needs:   Kcal:  1800-2000  Protein:  90-100 grams  Fluid:  1L+UOP     Theone Stanley., MS, RD, LDN (she/her/hers) RD pager number and weekend/on-call pager number located in [place].

## 2021-07-17 NOTE — Progress Notes (Signed)
MD notified that patient is having leg/foot pain. MD ordered lidocaine patch and gabapentin. Patient refused lidocaine patch. Will continue to monitor.

## 2021-07-17 NOTE — Procedures (Signed)
Vascular and Interventional Radiology Procedure Note  Patient: Thomas Robinson DOB: [birth year] Medical Record Number: [id number] Note Date/Time: 07/17/21 9:44 AM   Performing Physician: Michaelle Birks, MD Assistant(s): None  Diagnosis: ESRD requiring Hemodialysis  Procedure: TUNNELED HEMODIALYSIS CATHETER PLACEMENT  Anesthesia: Conscious Sedation Complications: None Estimated Blood Loss: Minimal Specimens:  None  Findings:  Successful placement of right-sided, 23 cm (tip-to-cuff), tunneled hemodialysis catheter with the tip of the catheter in the proximal right atrium.  Plan: Catheter ready for use.  See detailed procedure note with images in PACS. The patient tolerated the procedure well without incident or complication and was returned to Floor Bed in stable condition.    Michaelle Birks, MD [facility] Radiology   Pager. [phone]

## 2021-07-17 NOTE — Progress Notes (Signed)
[facility] Progress Note  Subjective: seen up in HD, got new TDC per IR this am.  No new c/o's.   Vitals:   07/17/21 0935 07/17/21 1023 07/17/21 1155 07/17/21 1205  BP: 92/68 100/71 92/61 (!) 85/57  Pulse: (!) 117 (!) 117  (!) 115  Resp: 15 14  14   Temp:  10.9 F (36.8 C) (!) 97.5 F (36.4 C)   TempSrc:   Temporal   SpO2: 96% 95%    Weight:   61.9 kg   Height:        Exam:  alert, nad   no jvd  Chest cta bilat  Cor reg no RG  Abd soft ntnd no ascites  Ext 2+ diffuse bilat LE edema from feet to hips, no wounds or ulcers Neuro is alert, Ox 3 , nf, gen deconditioning   RUE AVF (maturing), +bruit     Home meds include - eliquis, breo ellipta, sensipar, lasix 80 non hd days, metoprolol 50 bid, midodrine 10 bid, rena vit, renvela 1-2 ac,         OP HD: from 07/02/21 > MWF AF  4h  400/800    2/2 bath  Heparin 2000  RUE AVF (maturing)    - 1st stage R basilic vein AVF creation done by Dr Trula Slade on 04/04/21     Assessment/ Plan: Problem w/ dialysis access - [facility] removed last week for hospice transition, now pt changed his mind and wants to continue with dialysis. The AVF placed in august could not be used yesterday. Consulted IR who placed TDC just now, ready to use for HD. Consult VVS for 1st stage basilic vein AVF, what to do next?  ESRD - on HD MWF.  HD today, pt is on now.  Hypotension - on midodrine pre hd tiw Volume - vol excess, diffuse LE edema COPD MBD ckd - cont binders Anemia ckd - Hb 9.4    Rob Cherree Conerly 07/17/2021, 12:58 PM   Recent Labs  Lab 07/16/21 0922 07/17/21 0232  K 4.6 4.6  BUN 58* 61*  CREATININE 10.28* 10.45*  CALCIUM 9.2 8.9  HGB 9.4* 9.5*   Inpatient medications:  cephALEXin  500 mg Oral Q12H   Chlorhexidine Gluconate Cloth  6 each Topical Q0600   cinacalcet  60 mg Oral QPM   heparin sodium (porcine)       lidocaine  1 patch Transdermal Q24H   lidocaine       metoprolol tartrate  25 mg Oral BID   midodrine  10 mg Oral  BID   multivitamin  1 tablet Oral QHS   mupirocin ointment   Topical TID   nystatin   Topical BID   pantoprazole  40 mg Oral BID   sodium chloride flush  3 mL Intravenous Q12H    sodium chloride     sodium chloride, acetaminophen **OR** acetaminophen, albuterol, fentaNYL, fluticasone furoate-vilanterol, heparin, lidocaine (PF), midazolam, sodium chloride flush

## 2021-07-17 NOTE — Progress Notes (Signed)
Pt receives out-pt HD at [facility] on MWF. Pt arrives at 12:15 for 12:30 chair time. Spoke to clinic manager who confirms that pt does not need to be re-clipped in order to resume care at clinic. Will update clinic with d/c date once known. Will assist as needed.  Melven Sartorius [facility] R[facility] [place] [phone]

## 2021-07-17 NOTE — Consult Note (Signed)
Chief Complaint: Patient was seen in consultation today for tunneled dialysis catheter placement Chief Complaint  Patient presents with   Chest Pain   Leg Pain   Leg Swelling   Vascular Access Problem   at the request of Dr Mickel Crow   Supervising Physician: Michaelle Birks  Patient Status: [facility] - In-pt  History of Present Illness: Baxter Gonzalez is a 60 y.o. male   Chronic hypoxemic resp failure; CHF; A flutter; HTN; HLD Presented 11/15 to ED after leaving hospital AMA 11/10- GI Bleed Complaining of CP and fluid build up ESRD Last dialysis 11/9 Had chosen to not continue dialysis and was on Hospice for approx 1 week HD cath was removed 11/8 - per chart  Now wants to continue dialysis Has LUE fistula placed 01/01/21--- never used-- ??usable at this point  Nephro consult asking for new tunneled dialysis catheter placement   Past Medical History:  Diagnosis Date   Anaphylactic shock, unspecified, sequela 06/10/2019   Aortic stenosis    mild-moderate AS 12/2020 echo   Arthritis    Asthma, chronic, unspecified asthma severity, with acute exacerbation 10/23/2017   CAP (community acquired pneumonia) 10/23/2017   Carpal tunnel syndrome of right wrist 10/05/2018   Cataract    right - removed by surgery   Cerebral infarction due to thrombosis of cerebral artery (HCC)    Cervical disc herniation 01/04/2020   Chronic diastolic heart failure ([place]) 04/13/2018   Chronic low back pain 11/24/2019   Constipation    COPD (chronic obstructive pulmonary disease) (HCC)    Cough    chronic cough   Dyspnea    Encounter for immunization 07/08/2017   ESRD on hemodialysis ([place]) 02/16/2018   Fall 06/04/2019   GERD (gastroesophageal reflux disease) 06/04/2019   GI bleeding 05/24/2017   Gram-negative sepsis, unspecified ([place]) 09/05/2017   History of fusion of cervical spine 03/26/2020   Hyperlipidemia    Hypertension    Hypokalemia 06/04/2017   Iron deficiency anemia, unspecified  09/09/2017   Left shoulder pain 11/11/2019   Lumbar radiculopathy 11/24/2019   Lung contusion 06/04/2019   LVH (left ventricular hypertrophy) due to hypertensive disease, with heart failure ([place]) 06/18/2020   Macrocytic anemia 05/24/2017   Moderate protein-calorie malnutrition ([place]) 06/06/2017   Myofascial pain syndrome 06/12/2020   Neuritis of right ulnar nerve 09/13/2018   Non-compliance with renal dialysis ([place]) 02/08/2020   Oxygen deficiency 12/30/2019   O2 sats on RA 87% at PAT appt    Pain in joint of right elbow 09/13/2018   Pulmonary hypertension ([place])    Renovascular hypertension 06/22/2020   Restless leg syndrome    Rib fractures 06/2019   Right   S/P cardiac cath 08/27/2020   normal coronary arteries.   Secondary hyperparathyroidism of renal origin ([place]) 06/04/2017   Thoracic ascending aortic aneurysm    4.5 cm 06/04/19 CT   Ulnar neuropathy 10/05/2018   Volume overload 10/23/2017    Past Surgical History:  Procedure Laterality Date   A/V FISTULAGRAM N/A 01/01/2021   Procedure: A/V FISTULAGRAM;  Surgeon: Serafina Mitchell, MD;  Location: [place] [facility];  Service: Cardiovascular;  Laterality: N/A;   A/V FISTULAGRAM N/A 01/15/2021   Procedure: A/V [id number];  Surgeon: Serafina Mitchell, MD;  Location: [place] [facility];  Service: Cardiovascular;  Laterality: N/A;   ANTERIOR CERVICAL DECOMP/DISCECTOMY FUSION N/A 01/04/2020   Procedure: ANTERIOR CERVICAL DECOMPRESSION/DISCECTOMY FUSION CERVICAL FIVE THROUGH SEVEN;  Surgeon: Melina Schools, MD;  Location: [place];  Service:  Orthopedics;  Laterality: N/A;  3 hrs   AV FISTULA PLACEMENT Left 05/28/2017   Procedure: LEFT ARM ARTERIOVENOUS (AV) FISTULA CREATION;  Surgeon: Conrad Flowing Wells, MD;  Location: [place];  Service: Vascular;  Laterality: Left;   AV FISTULA PLACEMENT Left 02/02/2021   Procedure: Debride left forearm, ligation of left upper arm Aretiovenous fistula;  Surgeon: Elam Dutch, MD;  Location: [place];  Service:  Vascular;  Laterality: Left;   AV FISTULA PLACEMENT Right 04/04/2021   Procedure: RIGHT ARTERIOVENOUS (AV) FISTULA CREATION;  Surgeon: Serafina Mitchell, MD;  Location: [place] [facility];  Service: Vascular;  Laterality: Right;   BIOPSY  07/10/2021   Procedure: BIOPSY;  Surgeon: Lavena Bullion, DO;  Location: [place] [facility];  Service: Gastroenterology;;   COLONOSCOPY WITH PROPOFOL N/A 07/10/2021   Procedure: COLONOSCOPY WITH PROPOFOL;  Surgeon: Lavena Bullion, DO;  Location: [place];  Service: Gastroenterology;  Laterality: N/A;   ESOPHAGOGASTRODUODENOSCOPY (EGD) WITH PROPOFOL N/A 07/10/2021   Procedure: ESOPHAGOGASTRODUODENOSCOPY (EGD) WITH PROPOFOL;  Surgeon: Lavena Bullion, DO;  Location: [place];  Service: Gastroenterology;  Laterality: N/A;   EYE SURGERY Right 06/02/2019   Cataract removed   FRACTURE SURGERY     INSERTION OF DIALYSIS CATHETER Right 02/02/2021   Procedure: INSERTION OF Right Internal jugular TUNNELED  DIALYSIS CATHETER;  Surgeon: Elam Dutch, MD;  Location[facility];  Service: Vascular;  Laterality: Right;   IR DIALY SHUNT INTRO NEEDLE/INTRACATH INITIAL W/IMG LEFT Left 09/12/2020   IR FLUORO GUIDE CV LINE RIGHT  05/25/2017   IR US GUIDE VASC ACCESS LEFT  09/12/2020   IR US GUIDE VASC ACCESS RIGHT  05/25/2017   LIGATION OF ARTERIOVENOUS  FISTULA Left 01/10/2021   Procedure: LIGATION OF LEFT ARM RADIOCEPHALIC FISTULA;  Surgeon: Serafina Mitchell, MD;  Location: [place];  Service: Vascular;  Laterality: Left;   PERIPHERAL VASCULAR BALLOON ANGIOPLASTY Left 01/15/2021   Procedure: PERIPHERAL VASCULAR BALLOON ANGIOPLASTY;  Surgeon: Serafina Mitchell, MD;  Location: [place] [facility];  Service: Cardiovascular;  Laterality: Left;  AVF   REVISON OF ARTERIOVENOUS FISTULA Left 07/18/2019   Procedure: REVISION PLICATION OF RADIOCEPHALIC ARTERIOVENOUS FISTULA LEFT ARM;  Surgeon: Angelia Mould, MD;  Location: [place];  Service: Vascular;  Laterality: Left;   REVISON OF ARTERIOVENOUS  FISTULA Left 01/10/2021   Procedure: CONVERSION TO BRACHIOCEPHALIC ARTERIOVENOUS FISTULA;  Surgeon: Serafina Mitchell, MD;  Location: [facility];  Service: Vascular;  Laterality: Left;   RINOPLASTY      Allergies: Vancomycin  Medications: Prior to Admission medications   Medication Sig Start Date End Date Taking? Authorizing Provider  albuterol (VENTOLIN HFA) 108 (90 Base) MCG/ACT inhaler Inhale 2 puffs into the lungs 2 (two) times daily as needed for shortness of breath or wheezing. 06/01/21  Yes Lama, Marge Duncans, MD  BREO ELLIPTA 100-25 MCG/INH AEPB Inhale 1 puff into the lungs daily. Patient taking differently: Inhale 1 puff into the lungs daily as needed (shortness of breath). 06/01/21 07/31/21 Yes Oswald Hillock, MD  furosemide (LASIX) 80 MG tablet Take 1 tablet (80 mg total) by mouth daily as needed for fluid (Use only on non-HD days (Tues/Thur/Sat/Sun)). Patient taking differently: Take 80 mg by mouth daily. 06/01/21  Yes Oswald Hillock, MD  metoprolol tartrate (LOPRESSOR) 50 MG tablet Take 1 tablet (50 mg total) by mouth 2 (two) times daily. 06/01/21  Yes Oswald Hillock, MD  midodrine (PROAMATINE) 10 MG tablet Take 1 tablet (10 mg total) by mouth 3 (three) times  daily with meals. Patient taking differently: Take 10 mg by mouth 2 (two) times daily. 07/05/21 08/04/21 Yes Antonieta Pert, MD  multivitamin (RENA-VIT) TABS tablet Take 1 tablet by mouth at bedtime. 01/12/18  Yes [provider]  sevelamer carbonate (RENVELA) 800 MG tablet Take 800-1,600 mg by mouth See admin instructions. Take 1-2 tablets ([phone] mg) by mouth three times daily with meals, take 1 tablet (800 mg) with snacks 05/31/21  Yes [provider]  apixaban (ELIQUIS) 2.5 MG TABS tablet Take 1 tablet (2.5 mg total) by mouth 2 (two) times daily. Patient not taking: No sig reported 07/13/21 08/12/21  Darliss Cheney, MD  cinacalcet (SENSIPAR) 60 MG tablet Take 1 tablet (60 mg total) by mouth every evening. Patient not taking: No  sig reported 06/01/21   Oswald Hillock, MD     Family History  Problem Relation Age of Onset   Cancer Mother     Social History   Socioeconomic History   Marital status: Single    Spouse name: Not on file   Number of children: Not on file   Years of education: Not on file   Highest education level: Not on file  Occupational History   Not on file  Tobacco Use   Smoking status: Never   Smokeless tobacco: Never  Vaping Use   Vaping Use: Never used  Substance and Sexual Activity   Alcohol use: Not Currently    Alcohol/week: 0.0 standard drinks    Comment: has a drink once in a while- 12/30/19   Drug use: No   Sexual activity: Not Currently  Other Topics Concern   Not on file  Social History Narrative   Lives with a roommate in a one story home.  Has 1 child.  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  Education: high school.   Social Determinants of Health   Financial Resource Strain: Not on file  Food Insecurity: Not on file  Transportation Needs: Not on file  Physical Activity: Not on file  Stress: Not on file  Social Connections: Not on file     Review of Systems: A 12 point ROS discussed and pertinent positives are indicated in the HPI above.  All other systems are negative.  Review of Systems  Constitutional:  Positive for activity change and fatigue. Negative for fever.  Respiratory:  Negative for cough and shortness of breath.   Gastrointestinal:  Negative for abdominal pain and nausea.  Neurological:  Positive for weakness.  Psychiatric/Behavioral:  Negative for behavioral problems and confusion.    Vital Signs: BP 116/80 (BP Location: Left Arm)   Pulse (!) 124   Temp 98.5 F (36.9 C)   Resp 18   Ht 5\' 8"  (1.727 m)   Wt 134 lb (60.8 kg)   SpO2 93%   BMI 20.37 kg/m   Physical Exam Vitals reviewed.  Constitutional:      Comments: Sleepy--- was asleep when I came to room Arouseable and aware  Cardiovascular:     Rate and Rhythm: Normal rate and regular  rhythm.  Pulmonary:     Effort: Pulmonary effort is normal.  Abdominal:     Palpations: Abdomen is soft.  Musculoskeletal:        General: Normal range of motion.  Neurological:     Mental Status: He is alert and oriented to person, place, and time.  Psychiatric:        Behavior: Behavior normal.    Imaging: DG Chest 2 View  Result Date: 07/16/2021 CLINICAL DATA:  Chest pain EXAM: CHEST - 2 VIEW COMPARISON:  07/07/2021 FINDINGS: Interval removal of hemodialysis catheter. Stable cardiomegaly. Aortic atherosclerosis. Pulmonary vascular congestion with predominantly interstitial opacities throughout both lungs. Small right pleural effusion. No pneumothorax. IMPRESSION: Findings suggestive of CHF with pulmonary edema and small right pleural effusion. Electronically Signed   By: Davina Poke D.O.   On: 07/16/2021 09:55   DG Chest 2 View  Result Date: 06/29/2021 CLINICAL DATA:  Shortness of breath.  Left-sided chest pain.  Cough EXAM: CHEST - 2 VIEW COMPARISON:  Radiograph 05/31/2019 FINDINGS: Right-sided dialysis catheter remains in place. Cardiomegaly again seen. Increase in bilateral pleural effusions, right greater than left. Increased fluid in the right minor fissure. Is peribronchial and interstitial thickening likely represents pulmonary edema. There is no pneumothorax. IMPRESSION: 1. Pulmonary edema and pleural effusions, increased since prior exam. Findings suggest CHF. 2. Cardiomegaly is unchanged. Electronically Signed   By: Keith Rake M.D.   On: 06/29/2021 22:55   DG Abd 1 View  Result Date: 07/01/2021 CLINICAL DATA:  Vomiting EXAM: ABDOMEN - 1 VIEW COMPARISON:  None. FINDINGS: Bowel-gas pattern appears within normal limits. No abnormally distended gas-filled loops of bowel to suggest obstruction. No suspicious calcifications identified. IMPRESSION: No acute process identified. Electronically Signed   By: Ofilia Neas M.D.   On: 07/01/2021 12:58   CT HEAD WO CONTRAST  (5MM)  Result Date: 07/01/2021 CLINICAL DATA:  Seizure, abnormal neuro exam. EXAM: CT HEAD WITHOUT CONTRAST TECHNIQUE: Contiguous axial images were obtained from the base of the skull through the vertex without intravenous contrast. COMPARISON:  CT head 05/30/2021.  MRI brain 04/09/2015. FINDINGS: Brain: There has been recent contrast administration. No evidence of acute infarction, hemorrhage, hydrocephalus, extra-axial collection or mass lesion/mass effect. Again seen is mild diffuse atrophy and mild periventricular white matter hypodensity, likely chronic small vessel ischemic change. Vascular: Atherosclerotic calcifications are present within the cavernous internal carotid arteries. Skull: Normal. Negative for fracture or focal lesion. Sinuses/Orbits: No acute finding. Other: None. IMPRESSION: No acute intracranial abnormality. Electronically Signed   By: Ronney Asters M.D.   On: 07/01/2021 22:17   CT Angio Chest PE W/Cm &/Or Wo Cm  Result Date: 06/30/2021 CLINICAL DATA:  Chest pain or SOB, pleurisy or effusion suspected. Chest pain, dyspnea, cough. Hypoxia. EXAM: CT ANGIOGRAPHY CHEST WITH CONTRAST TECHNIQUE: Multidetector CT imaging of the chest was performed using the standard protocol during bolus administration of intravenous contrast. Multiplanar CT image reconstructions and MIPs were obtained to evaluate the vascular anatomy. CONTRAST:  42mL OMNIPAQUE IOHEXOL 350 MG/ML SOLN COMPARISON:  None. FINDINGS: Cardiovascular: Adequate opacification of the pulmonary arterial tree. No intraluminal filling defect identified to suggest acute pulmonary embolism. Central pulmonary arteries are of normal caliber. Extensive multi-vessel coronary artery calcification. Mild global cardiomegaly with left ventricular hypertrophy noted. Small pericardial effusion present. Mild atherosclerotic calcification within the thoracic aorta. The thoracic aorta is dilated in its ascending segment measuring 4.2 cm in greatest  dimension. The descending thoracic aorta is of normal caliber. Right internal jugular hemodialysis catheter tip noted within the superior cavoatrial junction. Mediastinum/Nodes: Visualized thyroid is unremarkable. No pathologic thoracic adenopathy. Esophagus is unremarkable. Lungs/Pleura: Moderate right and trace left pleural effusions are present. There is diffuse ground-glass pulmonary infiltrate and smooth interlobular septal thickening in keeping with probable diffuse alveolar and pulmonary interstitial edema. Widespread pneumonic or inflammatory infiltrate is possible but considered less likely. There is, however, collapse and consolidation of the right lower lobe. There is a  superimposed rounded region of hypoenhancement within the a right lower lobe, best seen on image # 112/4, suspicious for a a region of parenchymal necrosis in the setting of necrotizing pneumonia or, less likely, a acute to subacute pulmonary infarct. This measures 4.0 x 5.0 cm on axial image # 112/4. no pneumothorax. No central obstructing lesion peer Upper Abdomen: Marked atrophy of the visualized left kidney in keeping with changes of chronic renal insufficiency. No acute abnormality. Musculoskeletal: No acute bone abnormality. No lytic or blastic bone lesion. Review of the MIP images confirms the above findings. IMPRESSION: Collapse and consolidation of the right lower lobe in keeping with changes of lobar pneumonia, with superimposed area of hypoenhancement suspicious for an area of parenchymal necrosis. Less likely, this may represent a acute to subacute infarct. No pulmonary embolism, however, is identified. Superimposed diffuse ground-glass pulmonary infiltrate most suggestive of diffuse alveolar pulmonary edema. Moderate right pleural effusion is nonspecific and may relate to volume overload or represent a parapneumonic effusion. Mild cardiomegaly with left ventricular hypertrophy. Extensive coronary artery calcification. Small  pericardial effusion. Dilation of the ascending aorta with maximal dimension of 4.2 cm. Recommend annual imaging followup by CTA or MRA. This recommendation follows 2010 ACCF/AHA/AATS/ACR/ASA/SCA/SCAI/SIR/STS/SVM Guidelines for the Diagnosis and Management of Patients with Thoracic Aortic Disease. Circulation. 2010; 121: Q034-V425. Aortic aneurysm NOS (ICD10-I71.9) Electronically Signed   By: Fidela Salisbury M.D.   On: 06/30/2021 01:36   NM Pulmonary Perfusion  Result Date: 07/08/2021 CLINICAL DATA:  Chest pain, shortness of breath, history of blood clots EXAM: NUCLEAR MEDICINE PERFUSION LUNG SCAN TECHNIQUE: Perfusion images were obtained in multiple projections after intravenous injection of radiopharmaceutical. Ventilation scans intentionally deferred if perfusion scan and chest x-ray adequate for interpretation during COVID 19 epidemic. RADIOPHARMACEUTICALS:  3.91 mCi Tc-41m MAA IV COMPARISON:  Chest radiograph dated 07/07/2021. CTA chest dated 06/30/2021. FINDINGS: Normal perfusion, without wedge-shaped perfusion defects to suggest pulmonary embolism. Corresponding chest radiograph demonstrates patchy opacities in the right mid and lower lungs. IMPRESSION: Negative for pulmonary embolism. Electronically Signed   By: Julian Hy M.D.   On: 07/08/2021 18:23   DG Chest Port 1 View  Result Date: 07/07/2021 CLINICAL DATA:  Altered mental status x2 days, questionable sepsis. EXAM: PORTABLE CHEST 1 VIEW COMPARISON:  Chest radiograph July 02, 2021 FINDINGS: Dual lumen right chest wall dialysis catheter with tip overlying the superior cavoatrial junction. The heart size and mediastinal contours are stable. Similar size of the small right pleural effusion. Interval increased aeration of the right lower lobe of a decrease in the right basilar consolidation. No new focal consolidation. The visualized skeletal structures are unchanged. IMPRESSION: 1. Similar size of the small right pleural effusion with  interval improved aeration of the right lower lobe and decreased in the right basilar consolidation. No new focal consolidation. Electronically Signed   By: Dahlia Bailiff M.D.   On: 07/07/2021 20:45   DG CHEST PORT 1 VIEW  Result Date: 07/02/2021 CLINICAL DATA:  Status post right thoracentesis. EXAM: PORTABLE CHEST 1 VIEW COMPARISON:  07/02/2021 FINDINGS: There is a right chest wall dialysis catheter with tips in the cavoatrial junction. Heart size and mediastinal contours are stable. Decreased volume of the right pleural effusion status post thoracentesis. No pneumothorax identified. Atelectasis is identified within the right lung base. Similar appearance of mild bilateral hazy lung opacities throughout both lungs. IMPRESSION: 1. Decrease in volume of right pleural effusion status post thoracentesis. 2. No pneumothorax. Electronically Signed   By: Queen Slough.D.  On: 07/02/2021 15:44   DG CHEST PORT 1 VIEW  Result Date: 07/02/2021 CLINICAL DATA:  Weakness, pulmonary edema EXAM: PORTABLE CHEST 1 VIEW COMPARISON:  Portable exam 0838 hours compared to 06/29/2021 FINDINGS: RIGHT jugular dual lumen central venous catheter with tip projecting over SVC above cavoatrial junction. Enlargement of cardiac silhouette with pulmonary vascular congestion. Diffuse infiltrates consistent with pulmonary edema. Moderate RIGHT pleural effusion. No pneumothorax. Atherosclerotic calcification aorta. Bones demineralized with prior cervical spine fusion. IMPRESSION: Pulmonary infiltrate with increased RIGHT pleural effusion since previous exam. Aortic Atherosclerosis (ICD10-I70.0). Electronically Signed   By: Lavonia Dana M.D.   On: 07/02/2021 10:21   EEG adult  Result Date: 07/01/2021 Lora Havens, MD     07/01/2021  8:53 PM Patient Name: Kaleb Sek MRN: [id number] Epilepsy Attending: Lora Havens Referring Physician/Provider: Dr Susie Cassette Date: 07/01/2021 Duration: 24.30 mins Patient history: 60yo M  patient was noted to seizure-like activity, he was found unresponsive after generalized tonic-clonic shaking.  Seizure activity stopped spontaneously after 1 minute. Post seizure patient was noted to be confused, hypoxic. EEG to evaluate for seizure. Level of alertness: Awake, asleep AEDs during EEG study: LEV Technical aspects: This EEG study was done with scalp electrodes positioned according to the 10-20 International system of electrode placement. Electrical activity was acquired at a sampling rate of 500Hz  and reviewed with a high frequency filter of 70Hz  and a low frequency filter of 1Hz . EEG data were recorded continuously and digitally stored. Description: No posterior dominant rhythm was seen. Sleep was characterized by sleep spindles (12 to 14 Hz), maximal frontocentral region. EEG showed continuous generalized 3 to 6 Hz theta-delta slowing. Hyperventilation and photic stimulation were not performed.   ABNORMALITY - Continuous slow, generalized IMPRESSION: This study is suggestive of moderate diffuse encephalopathy, nonspecific etiology. No seizures or epileptiform discharges were seen throughout the recording. Lora Havens   ECHOCARDIOGRAM COMPLETE  Result Date: 07/02/2021    ECHOCARDIOGRAM REPORT   Patient Name:   SHERRILL MCKAMIE Date of Exam: 07/02/2021 Medical Rec #:  [id number]        Height:       68.0 in Accession #:    [id number]       Weight:       130.1 lb Date of Birth:  1961/04/29        BSA:          1.702 m Patient Age:    33 years         BP:           125/48 mmHg Patient Gender: M                HR:           119 bpm. Exam Location:  Inpatient Procedure: 2D Echo, Cardiac Doppler and Color Doppler Indications:    CHF  History:        Patient has prior history of Echocardiogram examinations, most                 recent 01/13/2021. CHF, Pulmonary HTN, Aortic Valve Disease,                 Signs/Symptoms:Hypotension; Risk Factors:Hypertension. ESRD.  Sonographer:    Merrie Roof RDCS  Referring Phys: [phone] [facility]  Sonographer Comments: Global longitudinal strain was attempted. The patient could no longer tolerate the exam. Strain attempted, before told to stop. IMPRESSIONS  1. Strain attempted but patient asked to stop. Would recomm  limited echo with strain only.  2. LV cavitiy is small. Turburlent flow through LV. Near caviity obliteration. . Left ventricular ejection fraction, by estimation, is 70 to 75%. The left ventricle has hyperdynamic function. The left ventricle has no regional wall motion abnormalities.  There is severe concentric left ventricular hypertrophy. Left ventricular diastolic function could not be evaluated.  3. Right ventricular systolic function is moderately reduced. The right ventricular size is mildly enlarged. Mildly increased right ventricular wall thickness.  4. Left atrial size was severely dilated.  5. Right atrial size was severely dilated.  6. Mild mitral valve regurgitation. Moderate to severe mitral annular calcification.  7. Tricuspid valve regurgitation is moderate.  8. AV is thickened with restricted motion. Peak and mean gradients through the valve are 22 and 12 mm hg respectively. Dimensionless gradient is 0.53 consistent with mild AS 2 D imaging suggests that it is moderate. No significant change in gradient from May 2022. Aortic valve regurgitation is mild.  9. Aortic dilatation noted. There is mild dilatation of the ascending aorta, measuring 40 mm. FINDINGS  Left Ventricle: LV cavitiy is small. Turburlent flow through LV. Near caviity obliteration. Left ventricular ejection fraction, by estimation, is 70 to 75%. The left ventricle has hyperdynamic function. The left ventricle has no regional wall motion abnormalities. The left ventricular internal cavity size was small. There is severe concentric left ventricular hypertrophy. Left ventricular diastolic function could not be evaluated. Right Ventricle: The right ventricular size is mildly  enlarged. Mildly increased right ventricular wall thickness. Right ventricular systolic function is moderately reduced. Left Atrium: Left atrial size was severely dilated. Right Atrium: Right atrial size was severely dilated. Pericardium: Trivial pericardial effusion is present. Mitral Valve: There is mild thickening of the mitral valve leaflet(s). Moderate to severe mitral annular calcification. Mild mitral valve regurgitation. Tricuspid Valve: The tricuspid valve is grossly normal. Tricuspid valve regurgitation is moderate. Aortic Valve: AV is thickened with restricted motion. Peak and mean gradients through the valve are 22 and 12 mm hg respectively. Dimensionless gradient is 0.53 consistent with mild AS 2 D imaging suggests that it is moderate. No significant change in gradient from May 2022. Aortic valve regurgitation is mild. Aortic valve mean gradient measures 12.0 mmHg. Aortic valve peak gradient measures 21.7 mmHg. Pulmonic Valve: The pulmonic valve was not well visualized. Pulmonic valve regurgitation is mild. Aorta: Aortic dilatation noted. There is mild dilatation of the ascending aorta, measuring 40 mm. IAS/Shunts: No atrial level shunt detected by color flow Doppler.  LEFT VENTRICLE PLAX 2D LVIDd:         2.90 cm LVIDs:         1.70 cm LV PW:         1.70 cm LV IVS:        1.70 cm  RIGHT VENTRICLE RV Basal diam:  4.50 cm RV Mid diam:    3.70 cm RV S prime:     11.10 cm/s TAPSE (M-mode): 1.3 cm LEFT ATRIUM              Index        RIGHT ATRIUM           Index LA diam:        3.50 cm  2.06 cm/m   RA Area:     28.60 cm LA Vol (A2C):   139.0 ml 81.66 ml/m  RA Volume:   99.30 ml  58.34 ml/m LA Vol (A4C):   75.6 ml  44.41 ml/m LA  Biplane Vol: 103.0 ml 60.51 ml/m  AORTIC VALVE AV Vmax:           233.00 cm/s AV Vmean:          159.000 cm/s AV VTI:            0.304 m AV Peak Grad:      21.7 mmHg AV Mean Grad:      12.0 mmHg LVOT Vmax:         128.00 cm/s LVOT Vmean:        80.800 cm/s LVOT VTI:           0.162 m LVOT/AV VTI ratio: 0.53  AORTA Ao Root diam: 3.50 cm Ao Asc diam:  4.00 cm TRICUSPID VALVE TR Peak grad:   74.6 mmHg TR Vmax:        432.00 cm/s  SHUNTS Systemic VTI: 0.16 m Dorris Carnes MD Electronically signed by Dorris Carnes MD Signature Date/Time: 07/02/2021/7:51:14 PM    Final    VAS [place] LOWER EXTREMITY VENOUS (DVT)  Result Date: 07/01/2021  Lower Venous DVT Study Patient Name:  CRESTON KLAS  Date of Exam:   07/01/2021 Medical Rec #: [id number]         Accession #:    [id number] Date of Birth: 07-16-1961         Patient Gender: M Patient Age:   23 years Exam Location:  [facility] Procedure:      VAS [place] LOWER EXTREMITY VENOUS (DVT) Referring Phys: MURALI RAMASWAMY --------------------------------------------------------------------------------  Indications: Edema.  Risk Factors: None identified. Comparison Study: No prior studies. Performing Technologist: Oliver Hum RVT  Examination Guidelines: A complete evaluation includes B-mode imaging, spectral Doppler, color Doppler, and power Doppler as needed of all accessible portions of each vessel. Bilateral testing is considered an integral part of a complete examination. Limited examinations for reoccurring indications may be performed as noted. The reflux portion of the exam is performed with the patient in reverse Trendelenburg.  +---------+---------------+---------+-----------+----------+--------------+ RIGHT    CompressibilityPhasicitySpontaneityPropertiesThrombus Aging +---------+---------------+---------+-----------+----------+--------------+ CFV      Full           Yes      No                                  +---------+---------------+---------+-----------+----------+--------------+ SFJ      Full                                                        +---------+---------------+---------+-----------+----------+--------------+ FV Prox  Full                                                         +---------+---------------+---------+-----------+----------+--------------+ FV Mid   Full                                                        +---------+---------------+---------+-----------+----------+--------------+ FV DistalFull                                                        +---------+---------------+---------+-----------+----------+--------------+  PFV      Full                                                        +---------+---------------+---------+-----------+----------+--------------+ POP      Full           Yes      No                                  +---------+---------------+---------+-----------+----------+--------------+ PTV      Full                                                        +---------+---------------+---------+-----------+----------+--------------+ PERO     Full                                                        +---------+---------------+---------+-----------+----------+--------------+   +---------+---------------+---------+-----------+----------+--------------+ LEFT     CompressibilityPhasicitySpontaneityPropertiesThrombus Aging +---------+---------------+---------+-----------+----------+--------------+ CFV      Full           Yes      No                                  +---------+---------------+---------+-----------+----------+--------------+ SFJ      Full                                                        +---------+---------------+---------+-----------+----------+--------------+ FV Prox  Full                                                        +---------+---------------+---------+-----------+----------+--------------+ FV Mid   Full                                                        +---------+---------------+---------+-----------+----------+--------------+ FV DistalFull                                                         +---------+---------------+---------+-----------+----------+--------------+ PFV      Full                                                        +---------+---------------+---------+-----------+----------+--------------+  POP      Full           Yes      No                                  +---------+---------------+---------+-----------+----------+--------------+ PTV      Full                                                        +---------+---------------+---------+-----------+----------+--------------+ PERO     Full                                                        +---------+---------------+---------+-----------+----------+--------------+     Summary: RIGHT: - There is no evidence of deep vein thrombosis in the lower extremity.  - No cystic structure found in the popliteal fossa.  LEFT: - There is no evidence of deep vein thrombosis in the lower extremity.  - No cystic structure found in the popliteal fossa.  *See table(s) above for measurements and observations. Electronically signed by Orlie Pollen on 07/01/2021 at 2:27:07 PM.    Final    US Abdomen Limited RUQ (LIVER/GB)  Result Date: 07/04/2021 CLINICAL DATA:  Transaminitis. EXAM: ULTRASOUND ABDOMEN LIMITED RIGHT UPPER QUADRANT COMPARISON:  April 09, 2015 FINDINGS: Gallbladder: A moderate to marked amount of heterogeneous echogenic sludge is noted within the gallbladder lumen. No gallstones are identified. The gallbladder wall measures approximately 3.7 mm in thickness. No sonographic Murphy sign noted by sonographer. Common bile duct: Diameter: 2.4 mm Liver: No focal lesion identified. Diffusely increased echogenicity of the liver parenchyma is noted. Portal vein is patent on color Doppler imaging with normal direction of blood flow towards the liver. Other: A trace amount of right upper quadrant ascites is seen. Of incidental note is the presence of a right-sided pleural effusion. IMPRESSION: 1. Gallbladder sludge  without evidence of cholelithiasis. 2. Hepatic steatosis. 3. Trace amount of right upper quadrant ascites. 4. Right pleural effusion. Electronically Signed   By: Virgina Norfolk M.D.   On: 07/04/2021 19:54    Labs:  CBC: Recent Labs    07/07/21 2025 07/08/21 0608 07/16/21 0922 07/17/21 0232  WBC 7.2 8.7 6.5 6.8  HGB 10.0* 9.3* 9.4* 9.5*  HCT 31.0* 28.8* 31.4* 31.1*  PLT 123* 105* 122* 136*    COAGS: Recent Labs    06/30/21 0258 07/01/21 1329 07/02/21 0420 07/03/21 0412 07/04/21 1841 07/07/21 2159  INR 1.3* 1.6*  --  2.1* 1.5* 1.6*  APTT 37*  --  50*  --   --  38*    BMP: Recent Labs    07/07/21 2025 07/08/21 0608 07/16/21 0922 07/17/21 0232  NA 132* 134* 140 138  K 5.1 3.8 4.6 4.6  CL 96* 98 107 105  CO2 20* 18* 15* 15*  GLUCOSE 101* 90 99 98  BUN 58* 56* 58* 61*  CALCIUM 8.7* 8.7* 9.2 8.9  CREATININE 7.79* 8.29* 10.28* 10.45*  GFRNONAA 7* 7* 5* 5*    LIVER FUNCTION TESTS: Recent Labs    07/04/21 0249 07/05/21 0614 07/07/21 2025 07/17/21 0232  BILITOT 1.2 1.2 1.5* 1.0  AST 738* 309* 102* 16  ALT 1,130* 888* 496* 65*  ALKPHOS 85 118 159* 129*  PROT 5.5* 5.9* 6.8 6.1*  ALBUMIN 2.2* 2.4* 3.1* 2.5*    TUMOR MARKERS: No results for input(s): AFPTM, CEA, CA199, CHROMGRNA in the last 8760 hours.  Assessment and Plan:  ESRD Left arm fistula placed 01/01/21-- never used Previous dialysis catheter removed 11/8 per chart--- pt had chosen to not continue dialysis Now wants to resume Scheduled now for tunneled dialysis catheter placement per Dr Jonnie Finner Risks and benefits discussed with the patient including, but not limited to bleeding, infection, vascular injury, pneumothorax which may require chest tube placement, air embolism or even death  All of the patient's questions were answered, patient is agreeable to proceed. Consent signed and in chart.    Thank you for this interesting consult.  I greatly enjoyed meeting Chaden Doom and look forward  to participating in their care.  A copy of this report was sent to the requesting provider on this date.  Electronically Signed: Lavonia Drafts, PA-C 07/17/2021, 6:35 AM   I spent a total of 20 Minutes    in face to face in clinical consultation, greater than 50% of which was counseling/coordinating care for tunneled dialysis catheter placement

## 2021-07-18 ENCOUNTER — Inpatient Hospital Stay (HOSPITAL_COMMUNITY): Payer: Medicare Other

## 2021-07-18 DIAGNOSIS — N186 End stage renal disease: Secondary | ICD-10-CM | POA: Diagnosis not present

## 2021-07-18 DIAGNOSIS — Z515 Encounter for palliative care: Secondary | ICD-10-CM

## 2021-07-18 DIAGNOSIS — I5032 Chronic diastolic (congestive) heart failure: Secondary | ICD-10-CM | POA: Diagnosis not present

## 2021-07-18 DIAGNOSIS — I4892 Unspecified atrial flutter: Secondary | ICD-10-CM | POA: Diagnosis not present

## 2021-07-18 DIAGNOSIS — Z8673 Personal history of transient ischemic attack (TIA), and cerebral infarction without residual deficits: Secondary | ICD-10-CM

## 2021-07-18 DIAGNOSIS — N481 Balanitis: Secondary | ICD-10-CM | POA: Diagnosis not present

## 2021-07-18 DIAGNOSIS — D638 Anemia in other chronic diseases classified elsewhere: Secondary | ICD-10-CM | POA: Diagnosis not present

## 2021-07-18 DIAGNOSIS — Z992 Dependence on renal dialysis: Secondary | ICD-10-CM | POA: Diagnosis not present

## 2021-07-18 DIAGNOSIS — R531 Weakness: Secondary | ICD-10-CM | POA: Diagnosis not present

## 2021-07-18 LAB — BASIC METABOLIC PANEL
Anion gap: 14 (ref 5–15)
BUN: 39 mg/dL — ABNORMAL HIGH (ref 8–23)
CO2: 21 mmol/L — ABNORMAL LOW (ref 22–32)
Calcium: 8.8 mg/dL — ABNORMAL LOW (ref 8.9–10.3)
Chloride: 102 mmol/L (ref 98–111)
Creatinine, Ser: 7.38 mg/dL — ABNORMAL HIGH (ref 0.61–1.24)
GFR, Estimated: 8 mL/min — ABNORMAL LOW (ref >60)
Glucose, Bld: 115 mg/dL — ABNORMAL HIGH (ref 70–99)
Potassium: 4.3 mmol/L (ref 3.5–5.1)
Sodium: 137 mmol/L (ref 135–145)

## 2021-07-18 LAB — GASTROINTESTINAL PANEL BY PCR, STOOL (REPLACES STOOL CULTURE)

## 2021-07-18 LAB — CBC WITH DIFFERENTIAL/PLATELET
Abs Immature Granulocytes: 0.03 10*3/uL (ref 0.00–0.07)
Basophils Absolute: 0.1 10*3/uL (ref 0.0–0.1)
Basophils Relative: 2 %
Eosinophils Absolute: 0.1 10*3/uL (ref 0.0–0.5)
Eosinophils Relative: 2 %
HCT: 31.4 % — ABNORMAL LOW (ref 39.0–52.0)
Hemoglobin: 9.8 g/dL — ABNORMAL LOW (ref 13.0–17.0)
Immature Granulocytes: 1 %
Lymphocytes Relative: 13 %
Lymphs Abs: 0.8 10*3/uL (ref 0.7–4.0)
MCH: 33.9 pg (ref 26.0–34.0)
MCHC: 31.2 g/dL (ref 30.0–36.0)
MCV: 108.7 fL — ABNORMAL HIGH (ref 80.0–100.0)
Monocytes Absolute: 0.6 10*3/uL (ref 0.1–1.0)
Monocytes Relative: 10 %
Neutro Abs: 4.2 10*3/uL (ref 1.7–7.7)
Neutrophils Relative %: 72 %
Platelets: 125 10*3/uL — ABNORMAL LOW (ref 150–400)
RBC: 2.89 MIL/uL — ABNORMAL LOW (ref 4.22–5.81)
RDW: 22.5 % — ABNORMAL HIGH (ref 11.5–15.5)
WBC: 5.7 10*3/uL (ref 4.0–10.5)
nRBC: 0 % (ref 0.0–0.2)

## 2021-07-18 LAB — HEPATITIS B SURFACE ANTIBODY, QUANTITATIVE: Hep B S AB Quant (Post): <3.1 m[IU]/mL — ABNORMAL LOW (ref >9.9)

## 2021-07-18 MED ORDER — OXYCODONE HCL 5 MG PO TABS
5.0000 mg | ORAL_TABLET | Freq: Four times a day (QID) | ORAL | Status: DC | PRN
  Administered 2021-07-18 (×2): 5 mg via ORAL
  Filled 2021-07-18 (×2): qty 1

## 2021-07-18 MED ORDER — METOPROLOL TARTRATE 50 MG PO TABS
50.0000 mg | ORAL_TABLET | Freq: Two times a day (BID) | ORAL | Status: DC
  Administered 2021-07-18 – 2021-07-19 (×3): 50 mg via ORAL
  Filled 2021-07-18 (×3): qty 1

## 2021-07-18 NOTE — Anesthesia Preprocedure Evaluation (Addendum)
Anesthesia Evaluation  Patient identified by MRN, date of birth, ID band Patient awake    Reviewed: Allergy & Precautions, NPO status , Patient's Chart, lab work & pertinent test results  Airway Mallampati: II  TM Distance: >3 FB Neck ROM: Full    Dental  (+) Poor Dentition, Dental Advisory Given   Pulmonary shortness of breath, asthma , pneumonia, COPD,  COPD inhaler,    Pulmonary exam normal breath sounds clear to auscultation       Cardiovascular hypertension, Pt. on medications and Pt. on home beta blockers +CHF  + Valvular Problems/Murmurs AS and AI  Rhythm:Regular Rate:Tachycardia  Echo 07/2021 1. Strain attempted but patient asked to stop. Would recomm limited echo with strain only.  2. LV cavitiy is small. Turburlent flow through LV. Near caviity obliteration. . Left ventricular ejection fraction, by estimation, is 70 to 75%. The left ventricle has hyperdynamic function. The left ventricle has no regional wall motion abnormalities. There is severe concentric left ventricular hypertrophy. Left ventricular diastolic function could not be evaluated.  3. Right ventricular systolic function is moderately reduced. The right ventricular size is mildly enlarged. Mildly increased right ventricular wall thickness.  4. Left atrial size was severely dilated.  5. Right atrial size was severely dilated.  6. Mild mitral valve regurgitation. Moderate to severe mitral annular calcification.  7. Tricuspid valve regurgitation is moderate.  8. AV is thickened with restricted motion. Peak and mean gradients through the valve are 22 and 12 mm hg respectively. Dimensionless gradient is 0.53 consistent with mild AS 2 D imaging suggests that it is moderate.  No significant change in gradient from May 2022. Aortic valve regurgitation is mild.  9. Aortic dilatation noted. There is mild dilatation of the ascending aorta, measuring 40 mm.     Neuro/Psych CVA negative psych ROS   GI/Hepatic Neg liver ROS, GERD  ,Rectal bleeding   Endo/Other  negative endocrine ROS  Renal/GU ESRF and Renal InsufficiencyRenal disease  negative genitourinary   Musculoskeletal  (+) Arthritis ,   Abdominal   Peds  Hematology  (+) anemia ,   Anesthesia Other Findings   Reproductive/Obstetrics                            Anesthesia Physical  Anesthesia Plan  ASA: 3  Anesthesia Plan: Regional   Post-op Pain Management:    Induction: Intravenous  PONV Risk Score and Plan: 1 and Treatment may vary due to age or medical condition, Propofol infusion, Midazolam and TIVA  Airway Management Planned: Simple Face Mask, Natural Airway and Nasal Cannula  Additional Equipment: None  Intra-op Plan:   Post-operative Plan:   Informed Consent: I have reviewed the patients History and Physical, chart, labs and discussed the procedure including the risks, benefits and alternatives for the proposed anesthesia with the patient or authorized representative who has indicated his/her understanding and acceptance.     Dental advisory given  Plan Discussed with: CRNA  Anesthesia Plan Comments: (Notified at [phone] that patient was going at 0930, 30 minutes earlier than scheduled.  )       Anesthesia Quick Evaluation

## 2021-07-18 NOTE — Consult Note (Addendum)
Hospital Consult    Reason for Consult:  permanent access Requesting Physician:  Dr. Jonnie Finner MRN #:  [id number]  History of Present Illness: This is a 60 y.o. male with end-stage renal disease on hemodialysis seen in consultation for evaluation of existing right arm fistula.  Patient had a brachiobasilic fistula creation by Dr. Trula Slade in August of this year.  He was lost to follow-up prior to having second stage basilic vein transposition.  He had previously been in hospice care however has changes mind and would like to pursue hemodialysis.  TDC was placed by interventional radiology during current hospitalization.  Patient is willing to undergo second stage basilic vein transposition in order to establish permanent access.  Past Medical History:  Diagnosis Date   Anaphylactic shock, unspecified, sequela 06/10/2019   Aortic stenosis    mild-moderate AS 12/2020 echo   Arthritis    Asthma, chronic, unspecified asthma severity, with acute exacerbation 10/23/2017   CAP (community acquired pneumonia) 10/23/2017   Carpal tunnel syndrome of right wrist 10/05/2018   Cataract    right - removed by surgery   Cerebral infarction due to thrombosis of cerebral artery (HCC)    Cervical disc herniation 01/04/2020   Chronic diastolic heart failure ([place]) 04/13/2018   Chronic low back pain 11/24/2019   Constipation    COPD (chronic obstructive pulmonary disease) (HCC)    Cough    chronic cough   Dyspnea    Encounter for immunization 07/08/2017   ESRD on hemodialysis ([place]) 02/16/2018   Fall 06/04/2019   GERD (gastroesophageal reflux disease) 06/04/2019   GI bleeding 05/24/2017   Gram-negative sepsis, unspecified ([place]) 09/05/2017   History of fusion of cervical spine 03/26/2020   Hyperlipidemia    Hypertension    Hypokalemia 06/04/2017   Iron deficiency anemia, unspecified 09/09/2017   Left shoulder pain 11/11/2019   Lumbar radiculopathy 11/24/2019   Lung contusion 06/04/2019   LVH (left  ventricular hypertrophy) due to hypertensive disease, with heart failure ([place]) 06/18/2020   Macrocytic anemia 05/24/2017   Moderate protein-calorie malnutrition ([place]) 06/06/2017   Myofascial pain syndrome 06/12/2020   Neuritis of right ulnar nerve 09/13/2018   Non-compliance with renal dialysis ([place]) 02/08/2020   Oxygen deficiency 12/30/2019   O2 sats on RA 87% at PAT appt    Pain in joint of right elbow 09/13/2018   Pulmonary hypertension ([place])    Renovascular hypertension 06/22/2020   Restless leg syndrome    Rib fractures 06/2019   Right   S/P cardiac cath 08/27/2020   normal coronary arteries.   Secondary hyperparathyroidism of renal origin ([place]) 06/04/2017   Thoracic ascending aortic aneurysm    4.5 cm 06/04/19 CT   Ulnar neuropathy 10/05/2018   Volume overload 10/23/2017    Past Surgical History:  Procedure Laterality Date   A/V FISTULAGRAM N/A 01/01/2021   Procedure: A/V FISTULAGRAM;  Surgeon: Serafina Mitchell, MD;  Location: [place] [facility];  Service: Cardiovascular;  Laterality: N/A;   A/V FISTULAGRAM N/A 01/15/2021   Procedure: A/V YQMVHQIONGE;  Surgeon: Serafina Mitchell, MD;  Location: [place] [facility];  Service: Cardiovascular;  Laterality: N/A;   ANTERIOR CERVICAL DECOMP/DISCECTOMY FUSION N/A 01/04/2020   Procedure: ANTERIOR CERVICAL DECOMPRESSION/DISCECTOMY FUSION CERVICAL FIVE THROUGH SEVEN;  Surgeon: Melina Schools, MD;  Location: [place];  Service: Orthopedics;  Laterality: N/A;  3 hrs   AV FISTULA PLACEMENT Left 05/28/2017   Procedure: LEFT ARM ARTERIOVENOUS (AV) FISTULA CREATION;  Surgeon: Conrad Botetourt, MD;  Location: [place];  Service: Vascular;  Laterality: Left;   AV FISTULA PLACEMENT Left 02/02/2021   Procedure: Debride left forearm, ligation of left upper arm Aretiovenous fistula;  Surgeon: Elam Dutch, MD;  Location: [place];  Service: Vascular;  Laterality: Left;   AV FISTULA PLACEMENT Right 04/04/2021   Procedure: RIGHT ARTERIOVENOUS (AV) FISTULA CREATION;   Surgeon: Serafina Mitchell, MD;  Location: [place] [facility];  Service: Vascular;  Laterality: Right;   BIOPSY  07/10/2021   Procedure: BIOPSY;  Surgeon: Lavena Bullion, DO;  Location: [place] [facility];  Service: Gastroenterology;;   COLONOSCOPY WITH PROPOFOL N/A 07/10/2021   Procedure: COLONOSCOPY WITH PROPOFOL;  Surgeon: Lavena Bullion, DO;  Location: [place];  Service: Gastroenterology;  Laterality: N/A;   ESOPHAGOGASTRODUODENOSCOPY (EGD) WITH PROPOFOL N/A 07/10/2021   Procedure: ESOPHAGOGASTRODUODENOSCOPY (EGD) WITH PROPOFOL;  Surgeon: Lavena Bullion, DO;  Location: [employer];  Service: Gastroenterology;  Laterality: N/A;   EYE SURGERY Right 06/02/2019   Cataract removed   FRACTURE SURGERY     INSERTION OF DIALYSIS CATHETER Right 02/02/2021   Procedure: INSERTION OF Right Internal jugular TUNNELED  DIALYSIS CATHETER;  Surgeon: Elam Dutch, MD;  Location: [place];  Service: Vascular;  Laterality: Right;   IR DIALY SHUNT INTRO NEEDLE/INTRACATH INITIAL W/IMG LEFT Left 09/12/2020   IR FLUORO GUIDE CV LINE RIGHT  05/25/2017   IR FLUORO GUIDE CV LINE RIGHT  07/17/2021   IR US GUIDE VASC ACCESS LEFT  09/12/2020   IR US GUIDE VASC ACCESS RIGHT  05/25/2017   IR US GUIDE VASC ACCESS RIGHT  07/17/2021   LIGATION OF ARTERIOVENOUS  FISTULA Left 01/10/2021   Procedure: LIGATION OF LEFT ARM RADIOCEPHALIC FISTULA;  Surgeon: Serafina Mitchell, MD;  Location: [facility];  Service: Vascular;  Laterality: Left;   PERIPHERAL VASCULAR BALLOON ANGIOPLASTY Left 01/15/2021   Procedure: PERIPHERAL VASCULAR BALLOON ANGIOPLASTY;  Surgeon: Serafina Mitchell, MD;  Location: [place] [facility];  Service: Cardiovascular;  Laterality: Left;  AVF   REVISON OF ARTERIOVENOUS FISTULA Left 07/18/2019   Procedure: REVISION PLICATION OF RADIOCEPHALIC ARTERIOVENOUS FISTULA LEFT ARM;  Surgeon: Angelia Mould, MD;  Location: [place];  Service: Vascular;  Laterality: Left;   REVISON OF ARTERIOVENOUS FISTULA Left 01/10/2021   Procedure:  CONVERSION TO BRACHIOCEPHALIC ARTERIOVENOUS FISTULA;  Surgeon: Serafina Mitchell, MD;  Location: [facility];  Service: Vascular;  Laterality: Left;   RINOPLASTY      Allergies  Allergen Reactions   Vancomycin Shortness Of Breath and Itching    Prior to Admission medications   Medication Sig Start Date End Date Taking? Authorizing Provider  albuterol (VENTOLIN HFA) 108 (90 Base) MCG/ACT inhaler Inhale 2 puffs into the lungs 2 (two) times daily as needed for shortness of breath or wheezing. 06/01/21  Yes Lama, Marge Duncans, MD  BREO ELLIPTA 100-25 MCG/INH AEPB Inhale 1 puff into the lungs daily. Patient taking differently: Inhale 1 puff into the lungs daily as needed (shortness of breath). 06/01/21 07/31/21 Yes Oswald Hillock, MD  furosemide (LASIX) 80 MG tablet Take 1 tablet (80 mg total) by mouth daily as needed for fluid (Use only on non-HD days (Tues/Thur/Sat/Sun)). Patient taking differently: Take 80 mg by mouth daily. 06/01/21  Yes Oswald Hillock, MD  metoprolol tartrate (LOPRESSOR) 50 MG tablet Take 1 tablet (50 mg total) by mouth 2 (two) times daily. 06/01/21  Yes Oswald Hillock, MD  midodrine (PROAMATINE) 10 MG tablet Take 1 tablet (10 mg total) by mouth 3 (three) times daily with meals.  Patient taking differently: Take 10 mg by mouth 2 (two) times daily. 07/05/21 08/04/21 Yes Antonieta Pert, MD  multivitamin (RENA-VIT) TABS tablet Take 1 tablet by mouth at bedtime. 01/12/18  Yes [provider]  sevelamer carbonate (RENVELA) 800 MG tablet Take 800-1,600 mg by mouth See admin instructions. Take 1-2 tablets [phone] mg) by mouth three times daily with meals, take 1 tablet (800 mg) with snacks 05/31/21  Yes [provider]  apixaban (ELIQUIS) 2.5 MG TABS tablet Take 1 tablet (2.5 mg total) by mouth 2 (two) times daily. Patient not taking: No sig reported 07/13/21 08/12/21  Darliss Cheney, MD  cinacalcet (SENSIPAR) 60 MG tablet Take 1 tablet (60 mg total) by mouth every evening. Patient not  taking: No sig reported 06/01/21   Oswald Hillock, MD    Social History   Socioeconomic History   Marital status: Single    Spouse name: Not on file   Number of children: Not on file   Years of education: Not on file   Highest education level: Not on file  Occupational History   Not on file  Tobacco Use   Smoking status: Never   Smokeless tobacco: Never  Vaping Use   Vaping Use: Never used  Substance and Sexual Activity   Alcohol use: Not Currently    Alcohol/week: 0.0 standard drinks    Comment: has a drink once in a while- 12/30/19   Drug use: No   Sexual activity: Not Currently  Other Topics Concern   Not on file  Social History Narrative   Lives with a roommate in a one story home.  Has 1 child.  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  Education: high school.   Social Determinants of Health   Financial Resource Strain: Not on file  Food Insecurity: Not on file  Transportation Needs: Not on file  Physical Activity: Not on file  Stress: Not on file  Social Connections: Not on file  Intimate Partner Violence: Not on file     Family History  Problem Relation Age of Onset   Cancer Mother     ROS: Otherwise negative unless mentioned in HPI  Physical Examination  Vitals:   07/18/21 0042 07/18/21 0422  BP: 92/69 96/71  Pulse: (!) 118 (!) 118  Resp: 16 16  Temp: 98.8 F (37.1 C) 99 F (37.2 C)  SpO2: 96% 93%   Body mass index is 20.75 kg/m.  General:  WDWN in NAD Gait: Not observed HENT: WNL, normocephalic Pulmonary: normal non-labored breathing, without Rales, rhonchi,  wheezing Cardiac: regular Abdomen:  soft, NT/ND, no masses Skin: without rashes Vascular Exam/Pulses: palpable R radial 1+ Extremities: Palpable thrill near fistula anastomosis and right arm Musculoskeletal: no muscle wasting or atrophy  Neurologic: A&O X 3 Psychiatric:  The pt has Normal affect. Lymph:  Unremarkable  CBC    Component Value Date/Time   WBC 5.7 07/18/2021 0348    RBC 2.89 (L) 07/18/2021 0348   HGB 9.8 (L) 07/18/2021 0348   HCT 31.4 (L) 07/18/2021 0348   HCT 27.3 (L) 05/17/2019 0203   PLT 125 (L) 07/18/2021 0348   MCV 108.7 (H) 07/18/2021 0348   MCH 33.9 07/18/2021 0348   MCHC 31.2 07/18/2021 0348   RDW 22.5 (H) 07/18/2021 0348   LYMPHSABS 0.8 07/18/2021 0348   MONOABS 0.6 07/18/2021 0348   EOSABS 0.1 07/18/2021 0348   BASOSABS 0.1 07/18/2021 0348    BMET    Component Value Date/Time   NA  137 07/18/2021 0348   NA 141 03/21/2020 1515   K 4.3 07/18/2021 0348   CL 102 07/18/2021 0348   CO2 21 (L) 07/18/2021 0348   GLUCOSE 115 (H) 07/18/2021 0348   BUN 39 (H) 07/18/2021 0348   BUN 53 (H) 03/21/2020 1515   CREATININE 7.38 (H) 07/18/2021 0348   CALCIUM 8.8 (L) 07/18/2021 0348   GFRNONAA 8 (L) 07/18/2021 0348   GFRAA 4 (L) 05/30/2020 1643    COAGS: Lab Results  Component Value Date   INR 1.4 (H) 07/17/2021   INR 1.6 (H) 07/07/2021   INR 1.5 (H) 07/04/2021     Non-Invasive Vascular Imaging:   Fistula duplex pending    ASSESSMENT/PLAN: This is a 60 y.o. male with end-stage renal disease on hemodialysis  - Right arm brachiobasilic fistula is patent with a palpable thrill near the anastomosis - Patient will require second stage basilic vein transposition prior to use of fistula.  He is currently agreeable to this. - We have ordered a fistula duplex to see if fistula is mature enough for second stage.  Plan pending fistula duplex.  Continue HD via [facility] for now   Dagoberto Ligas PA-C [facility] [phone]   I agree with the above.  Have seen and evaluated the patient.  He needs a second stage basilic vein fistula creation.  We are in the process of getting a duplex of his fistula.  I discussed proceeding with this tomorrow, likely with one of my partners.  He is eager to get this done.  [place] problem

## 2021-07-18 NOTE — Progress Notes (Signed)
[facility] Progress Note  Subjective: seen up in HD, got new TDC per IR this am.  No new c/o's. Had HD yesterday w/ 200 cc net UF, BP's were in the 80's early in HD.   Vitals:   07/17/21 2009 07/18/21 0042 07/18/21 0422 07/18/21 0935  BP: (!) 85/61 92/69 96/71  100/66  Pulse: (!) 116 (!) 118 (!) 118 (!) 101  Resp: 16 16 16 18   Temp: 97.7 F (36.5 C) 98.8 F (37.1 C) 99 F (37.2 C) 98.8 F (37.1 C)  TempSrc: Oral Oral Oral Oral  SpO2: 90% 96% 93% 94%  Weight:      Height:        Exam:  alert, nad   no jvd  Chest cta bilat  Cor reg no RG  Abd soft ntnd no ascites  Ext 1-2+ diffuse bilat LE edema from feet to hips Neuro is alert, Ox 3 , nf, gen deconditioning   RUE AVF (maturing), +bruit     Home meds include - eliquis, breo ellipta, sensipar, lasix 80 non hd days, metoprolol 50 bid, midodrine 10 bid, rena vit, renvela 1-2 ac,         OP HD: from 07/02/21 > MWF AF  4h  400/800    2/2 bath  Heparin 2000  RUE AVF (maturing)    - 1st stage R basilic vein AVF creation done by Dr Trula Slade on 04/04/21     Assessment/ Plan: Problem w/ dialysis access - [facility] removed last week for hospice transition, now pt changed his mind and wants to continue with dialysis. The AVF placed in august could not be used yesterday. IR placed [place] on 11/16. VVS seeing, planning for 2nd stage AVF surgery tomorrow.  ESRD - on HD MWF.  HD Friday, schedule around surgery.  Hypotension - on midodrine pre hd tiw Volume - vol excess, diffuse LE edema COPD MBD ckd - cont binders Anemia ckd - Hb 9.4    Rob Danesha Kirchoff 07/18/2021, 12:57 PM   Recent Labs  Lab 07/17/21 0232 07/18/21 0348  K 4.6 4.3  BUN 61* 39*  CREATININE 10.45* 7.38*  CALCIUM 8.9 8.8*  HGB 9.5* 9.8*    Inpatient medications:  (feeding supplement) PROSource Plus  30 mL Oral BID BM   cephALEXin  500 mg Oral Q12H   Chlorhexidine Gluconate Cloth  6 each Topical Q0600   cinacalcet  60 mg Oral QPM   feeding supplement  (NEPRO CARB STEADY)  237 mL Oral BID BM   lidocaine  1 patch Transdermal Q24H   metoprolol tartrate  50 mg Oral BID   midodrine  10 mg Oral BID   multivitamin  1 tablet Oral QHS   mupirocin ointment   Topical TID   nystatin   Topical BID   pantoprazole  40 mg Oral BID   sodium chloride flush  3 mL Intravenous Q12H    sodium chloride     sodium chloride, acetaminophen **OR** acetaminophen, albuterol, fentaNYL, fluticasone furoate-vilanterol, guaiFENesin-dextromethorphan, lidocaine (PF), midazolam, oxyCODONE, sodium chloride flush

## 2021-07-18 NOTE — Progress Notes (Signed)
Patient ID: Thomas Robinson, male   DOB: 21-Jun-1961, 60 y.o.   MRN: [id number]  PROGRESS NOTE    Kelen Laura  HFW:[id number] DOB: [birth year] DOA: 07/16/2021 PCP: [name], PA   Brief Narrative:  60 year old male with history of chronic hypoxic respiratory failure on 2.5 L oxygen via nasal cannula, chronic diastolic CHF, end-stage renal disease on hemodialysis, thoracic aortic aneurysm, atrial flutter on Eliquis, chronic anemia, secondary hyperparathyroidism, hypertension, hyperlipidemia, recent admission from 07/07/2021 and patient left AMA on 07/11/2021 for GI bleeding needing EGD/colonoscopy on 07/10/2021 which showed internal hemorrhoids, barotrauma, gastritis but no active bleeding and GI recommended Protonix 40 mg twice daily x6 weeks and to resume Eliquis from 07/13/2021 onwards which patient has not done so far.  His last dialysis session was 07/10/2021.  Before signing out AMA, he had contemplated home hospice but wanted to remain full code; tunneled dialysis catheter was removed prior to him signing out [place].  He presented again on 07/16/2021 with chest pain.  On presentation, he had troponins of 74 and 75; chest x-ray findings suggestive of CHF with pulmonary edema and small right-sided pleural effusion.  Nephrology was consulted.  Assessment & Plan:   Possible volume overload/pulmonary edema secondary to missed dialysis End-stage renal disease on hemodialysis Chest pain: Possibly from above -Presented with chest pain and shortness of breath because of volume overload.  Nephrology following. -Tunneled catheter was removed last week before patient signed out [place].   -Status post tunneled hemodialysis catheter placement by IR on 07/17/2021.  Dialysis as per nephrology schedule -Troponins did not trend up.  EKG did not show significant ST elevation/depression -Patient was contemplating home hospice during last hospitalization but currently wants to continue  hemodialysis. -Palliative care consultation for goals of care discussion. -Vascular surgery has been consulted regarding problem with AV fistula.  Paroxysmal atrial flutter -Diagnosed during last hospitalization.  Cardiology had recommended TEE/DCCV when recovered from illness: Possibly as an outpatient in 2 to 4 weeks -Patient has not started back his Eliquis on 07/13/2021 as advised by GI. -Currently still tachycardic.  Cardiology had recommended to increase metoprolol to 50 mg twice a day during hospitalization. -Eliquis on hold for now in case patient needs any vascular procedures.  Balanitis -Continue renally dosed Keflex and topical mupirocin  Diarrhea -Stools are solid in the hospital.  Will DC stool testing.  Acute on chronic diastolic heart failure -Volume being managed by dialysis.  Strict input and output.  Daily weights.  Fluid restriction.  History of unspecified CVA -Resume Eliquis once procedures have been completed  Thoracic ascending aortic aneurysm -On CT in September 2022.  Needs semiannual imaging and outpatient follow-up  Anemia of chronic disease -Hemoglobin stable.  From renal failure.  Thrombocytopenia -Questionable cause.  Monitor  Coccygeal stage II pressure ulcer: Present on admission -Follow wound care consultation recommendations    DVT prophylaxis: SCDs Code Status: Full Family Communication: None at bedside Disposition Plan: Status is: Inpatient  Remains inpatient appropriate because: Of need for resumption of hemodialysis  Consultants: Nephrology/IR/vascular surgery.   palliative care  Procedures: Tunneled hemodialysis catheter placement by IR on 07/17/2021  Antimicrobials:  Anti-infectives (From admission, onward)    Start     Dose/Rate Route Frequency Ordered Stop   07/17/21 0730  ceFAZolin (ANCEF) IVPB 2g/100 mL premix        2 g 200 mL/hr over 30 Minutes Intravenous To Radiology 07/17/21 0634 07/17/21 0920   07/16/21 2200   cephALEXin (KEFLEX) capsule 500 mg  Note to Pharmacy: If need to do q 24 hours that is fine too   500 mg Oral Every 12 hours 07/16/21 1945 07/26/21 2159        Subjective: Patient seen and examined at bedside.  Poor historian.  No overnight fever, vomiting, chest pain or shortness of breath reported.  Asks me if he can go home today.  Objective: Vitals:   07/17/21 2006 07/17/21 2009 07/18/21 0042 07/18/21 0422  BP: (!) 85/61 (!) _0  Pulse: (!) 116 (!) 116 (!) 118 (!) 118  Resp: _1 Temp: 97.7 F (36.5 C) 97.7 F (36.5 C) 98.8 F (37.1 C) 99 F (37.2 C)  TempSrc: Oral Oral Oral Oral  SpO2: 90% 90% 96% 93%  Weight:      Height:        Intake/Output Summary (Last 24 hours) at 07/18/2021 0742 Last data filed at 07/18/2021 0000 Gross per 24 hour  Intake 360 ml  Output -200 ml  Net 560 ml    Filed Weights   07/17/21 0213 07/17/21 1155  Weight: 60.8 kg 61.9 kg    Examination:  General exam: Looks chronically ill and deconditioned.  No distress.  On 3 L oxygen by nasal cannula intermittently.   Respiratory system: Decreased breath sounds at bases bilaterally with some crackles  cardiovascular system: Tachycardia present; S1-S2 heard gastrointestinal system: Abdomen is distended slightly, soft and nontender.  Bowel sounds are heard Extremities: Mild lower extremity edema present; no clubbing  Central nervous system: Awake and alert.  Still very slow to respond.  Extremely poor historian.  No focal neurological deficits.  Moves extremities  skin: No obvious petechiae/rashes Psychiatry: Hardly participates in any conversation.  Flat affect.  Data Reviewed: I have personally reviewed following labs and imaging studies  CBC: Recent Labs  Lab 07/16/21 0922 07/17/21 0232 07/18/21 0348  WBC 6.5 6.8 5.7  NEUTROABS  --   --  4.2  HGB 9.4* 9.5* 9.8*  HCT 31.4* 31.1* 31.4*  MCV 112.5* 109.1* 108.7*  PLT 122* 136* 125*    Basic Metabolic  Panel: Recent Labs  Lab 07/16/21 0922 07/17/21 0232 07/18/21 0348  NA 140 138 137  K 4.6 4.6 4.3  CL 107 105 102  CO2 15* 15* 21*  GLUCOSE 99 98 115*  BUN 58* 61* 39*  CREATININE 10.28* 10.45* 7.38*  CALCIUM 9.2 8.9 8.8*    GFR: Estimated Creatinine Clearance: 9.1 mL/min (A) (by C-G formula based on SCr of 7.38 mg/dL (H)). Liver Function Tests: Recent Labs  Lab 07/17/21 0232  AST 16  ALT 65*  ALKPHOS 129*  BILITOT 1.0  PROT 6.1*  ALBUMIN 2.5*    No results for input(s): LIPASE, AMYLASE in the last 168 hours. No results for input(s): AMMONIA in the last 168 hours. Coagulation Profile: Recent Labs  Lab 07/17/21 0648  INR 1.4*    Cardiac Enzymes: No results for input(s): CKTOTAL, CKMB, CKMBINDEX, TROPONINI in the last 168 hours. BNP (last 3 results) No results for input(s): PROBNP in the last 8760 hours. HbA1C: No results for input(s): HGBA1C in the last 72 hours. CBG: No results for input(s): GLUCAP in the last 168 hours. Lipid Profile: No results for input(s): CHOL, HDL, LDLCALC, TRIG, CHOLHDL, LDLDIRECT in the last 72 hours. Thyroid Function Tests: No results for input(s): TSH, T4TOTAL, FREET4, T3FREE, THYROIDAB in the last 72 hours. Anemia Panel: No results for input(s): VITAMINB12, FOLATE, FERRITIN, TIBC, IRON, RETICCTPCT in the last 72 hours.  Sepsis Labs: No results for input(s): PROCALCITON, LATICACIDVEN in the last 168 hours.  Recent Results (from the past 240 hour(s))  Resp Panel by RT-PCR (Flu A&B, Covid) Nasopharyngeal Swab     Status: None   Collection Time: 07/16/21  7:42 PM   Specimen: Nasopharyngeal Swab; Nasopharyngeal(NP) swabs in vial transport medium  Result Value Ref Range Status   SARS Coronavirus 2 by RT PCR NEGATIVE NEGATIVE Final    Comment: (NOTE) SARS-CoV-2 target nucleic acids are NOT DETECTED.  The SARS-CoV-2 RNA is generally detectable in upper respiratory specimens during the acute phase of infection. The  lowest concentration of SARS-CoV-2 viral copies this assay can detect is 138 copies/mL. A negative result does not preclude SARS-Cov-2 infection and should not be used as the sole basis for treatment or other patient management decisions. A negative result may occur with  improper specimen collection/handling, submission of specimen other than nasopharyngeal swab, presence of viral mutation(s) within the areas targeted by this assay, and inadequate number of viral copies(<138 copies/mL). A negative result must be combined with clinical observations, patient history, and epidemiological information. The expected result is Negative.  Fact Sheet for Patients:  EntrepreneurPulse.com.au  Fact Sheet for Healthcare Providers:  IncredibleEmployment.be  This test is no t yet approved or cleared by the [place] FDA and  has been authorized for detection and/or diagnosis of SARS-CoV-2 by FDA under an Emergency Use Authorization (EUA). This EUA will remain  in effect (meaning this test can be used) for the duration of the COVID-19 declaration under Section 564(b)(1) of the Act, 21 U.S.C.section 360bbb-3(b)(1), unless the authorization is terminated  or revoked sooner.       Influenza A by PCR NEGATIVE NEGATIVE Final   Influenza B by PCR NEGATIVE NEGATIVE Final    Comment: (NOTE) The Xpert Xpress SARS-CoV-2/FLU/RSV plus assay is intended as an aid in the diagnosis of influenza from Nasopharyngeal swab specimens and should not be used as a sole basis for treatment. Nasal washings and aspirates are unacceptable for Xpert Xpress SARS-CoV-2/FLU/RSV testing.  Fact Sheet for Patients: EntrepreneurPulse.com.au  Fact Sheet for Healthcare Providers: IncredibleEmployment.be  This test is not yet approved or cleared by the [place] FDA and has been authorized for detection and/or diagnosis of SARS-CoV-2 by FDA under  an Emergency Use Authorization (EUA). This EUA will remain in effect (meaning this test can be used) for the duration of the COVID-19 declaration under Section 564(b)(1) of the Act, 21 U.S.C. section 360bbb-3(b)(1), unless the authorization is terminated or revoked.  Performed at [facility], [place] [address], [place], [place] [address]   C Difficile Quick Screen w PCR reflex     Status: None   Collection Time: 07/17/21 11:30 AM   Specimen: Stool  Result Value Ref Range Status   C Diff antigen NEGATIVE NEGATIVE Final   C Diff toxin NEGATIVE NEGATIVE Final   C Diff interpretation No C. difficile detected.  Final    Comment: Performed at [facility], [place] [address], [place], [place] [address]          Radiology Studies: DG Chest 2 View  Result Date: 07/16/2021 CLINICAL DATA:  Chest pain EXAM: CHEST - 2 VIEW COMPARISON:  07/07/2021 FINDINGS: Interval removal of hemodialysis catheter. Stable cardiomegaly. Aortic atherosclerosis. Pulmonary vascular congestion with predominantly interstitial opacities throughout both lungs. Small right pleural effusion. No pneumothorax. IMPRESSION: Findings suggestive of CHF with pulmonary edema and small right pleural effusion. Electronically Signed   By: Hart Carwin  Plundo D.O.   On: 07/16/2021 09:55   IR Fluoro Guide CV Line Right  Result Date: 07/17/2021 INDICATION: ESRD requiring HD. EXAM: TUNNELED CENTRAL VENOUS HEMODIALYSIS CATHETER PLACEMENT WITH ULTRASOUND AND FLUOROSCOPIC GUIDANCE MEDICATIONS: Vancomycin 1 gm IV . The antibiotic was given in an appropriate time interval prior to skin puncture. ANESTHESIA/SEDATION: Moderate (conscious) sedation was employed during this procedure. A total of Versed 1 mg and Fentanyl 25 mcg was administered intravenously. Moderate Sedation Time: 15 minutes. The patient's level of consciousness and vital signs were monitored continuously by radiology nursing throughout the procedure under my direct  supervision. FLUOROSCOPY TIME:  0 minutes 6 seconds (1 mGy). COMPLICATIONS: None immediate. PROCEDURE: Informed written consent was obtained from the patient after a discussion of the risks, benefits, and alternatives to treatment. Questions regarding the procedure were encouraged and answered. The RIGHT neck and chest were prepped with chlorhexidine in a sterile fashion, and a sterile drape was applied covering the operative field. Maximum barrier sterile technique with sterile gowns and gloves were used for the procedure. A timeout was performed prior to the initiation of the procedure. After creating a small venotomy incision, a micropuncture kit was utilized to access the internal jugular vein. Real-time ultrasound guidance was utilized for vascular access including the acquisition of a permanent ultrasound image documenting patency of the accessed vessel. The microwire was utilized to measure appropriate catheter length. A stiff Glidewire was advanced to the level of the IVC and the micropuncture sheath was exchanged for a peel-away sheath. A hemosplit tunneled hemodialysis catheter measuring 23 cm from tip to cuff was tunneled in a retrograde fashion from the anterior chest wall to the venotomy incision. The catheter was then placed through the peel-away sheath with tips ultimately positioned within the superior aspect of the right atrium. Final catheter positioning was confirmed and documented with a spot radiographic image. The catheter aspirates and flushes normally. The catheter was flushed with appropriate volume heparin dwells. The catheter exit site was secured with a 0-silk retention suture. The venotomy incision was closed with an interrupted 2-0 Vicryl, then Dermabond was applied at the skin. Dressings were applied. The patient tolerated the procedure well without immediate post procedural complication. IMPRESSION: Successful placement of 23 cm tunneled hemodialysis catheter via the RIGHT internal  jugular vein with tip terminating within the proximal right atrium. The catheter is ready for immediate use. Michaelle Birks, MD [facility] Radiology Electronically Signed   By: Michaelle Birks M.D.   On: 07/17/2021 17:59   IR US Guide Vasc Access Right  Result Date: 07/17/2021 INDICATION: ESRD requiring HD. EXAM: TUNNELED CENTRAL VENOUS HEMODIALYSIS CATHETER PLACEMENT WITH ULTRASOUND AND FLUOROSCOPIC GUIDANCE MEDICATIONS: Vancomycin 1 gm IV . The antibiotic was given in an appropriate time interval prior to skin puncture. ANESTHESIA/SEDATION: Moderate (conscious) sedation was employed during this procedure. A total of Versed 1 mg and Fentanyl 25 mcg was administered intravenously. Moderate Sedation Time: 15 minutes. The patient's level of consciousness and vital signs were monitored continuously by radiology nursing throughout the procedure under my direct supervision. FLUOROSCOPY TIME:  0 minutes 6 seconds (1 mGy). COMPLICATIONS: None immediate. PROCEDURE: Informed written consent was obtained from the patient after a discussion of the risks, benefits, and alternatives to treatment. Questions regarding the procedure were encouraged and answered. The RIGHT neck and chest were prepped with chlorhexidine in a sterile fashion, and a sterile drape was applied covering the operative field. Maximum barrier sterile technique with sterile gowns and gloves were  used for the procedure. A timeout was performed prior to the initiation of the procedure. After creating a small venotomy incision, a micropuncture kit was utilized to access the internal jugular vein. Real-time ultrasound guidance was utilized for vascular access including the acquisition of a permanent ultrasound image documenting patency of the accessed vessel. The microwire was utilized to measure appropriate catheter length. A stiff Glidewire was advanced to the level of the IVC and the micropuncture sheath was  exchanged for a peel-away sheath. A hemosplit tunneled hemodialysis catheter measuring 23 cm from tip to cuff was tunneled in a retrograde fashion from the anterior chest wall to the venotomy incision. The catheter was then placed through the peel-away sheath with tips ultimately positioned within the superior aspect of the right atrium. Final catheter positioning was confirmed and documented with a spot radiographic image. The catheter aspirates and flushes normally. The catheter was flushed with appropriate volume heparin dwells. The catheter exit site was secured with a 0-silk retention suture. The venotomy incision was closed with an interrupted 2-0 Vicryl, then Dermabond was applied at the skin. Dressings were applied. The patient tolerated the procedure well without immediate post procedural complication. IMPRESSION: Successful placement of 23 cm tunneled hemodialysis catheter via the RIGHT internal jugular vein with tip terminating within the proximal right atrium. The catheter is ready for immediate use. Michaelle Birks, MD [facility] Radiology Electronically Signed   By: Michaelle Birks M.D.   On: 07/17/2021 17:59        Scheduled Meds:  (feeding supplement) PROSource Plus  30 mL Oral BID BM   cephALEXin  500 mg Oral Q12H   Chlorhexidine Gluconate Cloth  6 each Topical Q0600   cinacalcet  60 mg Oral QPM   feeding supplement (NEPRO CARB STEADY)  237 mL Oral BID BM   lidocaine  1 patch Transdermal Q24H   metoprolol tartrate  25 mg Oral BID   midodrine  10 mg Oral BID   multivitamin  1 tablet Oral QHS   mupirocin ointment   Topical TID   nystatin   Topical BID   pantoprazole  40 mg Oral BID   sodium chloride flush  3 mL Intravenous Q12H   Continuous Infusions:  sodium chloride            Aline August, MD [facility] 07/18/2021, 7:42 AM

## 2021-07-18 NOTE — Progress Notes (Signed)
Contacted pt via room phone. Palliative Care provider answered phone and relayed to pt the information for pt's out-pt HD schedule. Pt advised days/time to remain the same. Pt will go to AF on MWF. Pt needs to arrive at 12:15 for 12:30 chair time. Pt informed provider at bedside that he is not interested in transportation services to/from HD and that he has someone that can assist. Will assist as needed.  Melven Sartorius Renal Navigator [phone]
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
## 2021-07-18 NOTE — Progress Notes (Signed)
Patient refusing vitals, said that he will not take his vitals until the doctor comes and talks to him to tell him about his surgery.    Aurther Loft, RN

## 2021-07-19 ENCOUNTER — Inpatient Hospital Stay (HOSPITAL_COMMUNITY): Payer: Medicare Other

## 2021-07-19 ENCOUNTER — Encounter (HOSPITAL_COMMUNITY): Payer: Self-pay | Admitting: Family Medicine

## 2021-07-19 ENCOUNTER — Encounter (HOSPITAL_COMMUNITY): Payer: Self-pay | Admitting: Certified Registered"

## 2021-07-19 ENCOUNTER — Encounter (HOSPITAL_COMMUNITY)
Admission: EM | Discharge status: Against Medical Advice | Payer: Self-pay | Source: Home / Self Care | Attending: Pulmonary Disease

## 2021-07-19 DIAGNOSIS — Z992 Dependence on renal dialysis: Secondary | ICD-10-CM | POA: Diagnosis not present

## 2021-07-19 DIAGNOSIS — I469 Cardiac arrest, cause unspecified: Secondary | ICD-10-CM

## 2021-07-19 DIAGNOSIS — N481 Balanitis: Secondary | ICD-10-CM | POA: Diagnosis not present

## 2021-07-19 DIAGNOSIS — N186 End stage renal disease: Secondary | ICD-10-CM | POA: Diagnosis not present

## 2021-07-19 DIAGNOSIS — D638 Anemia in other chronic diseases classified elsewhere: Secondary | ICD-10-CM | POA: Diagnosis not present

## 2021-07-19 DIAGNOSIS — I4892 Unspecified atrial flutter: Secondary | ICD-10-CM | POA: Diagnosis not present

## 2021-07-19 LAB — POCT I-STAT 7, (LYTES, BLD GAS, ICA,H+H)
Acid-Base Excess: 0 mmol/L (ref 0.0–2.0)
Acid-base deficit: 8 mmol/L — ABNORMAL HIGH (ref 0.0–2.0)
Acid-base deficit: 9 mmol/L — ABNORMAL HIGH (ref 0.0–2.0)
Acid-base deficit: 13 mmol/L — ABNORMAL HIGH (ref 0.0–2.0)
Bicarbonate: 17.9 mmol/L — ABNORMAL LOW (ref 20.0–28.0)
Bicarbonate: 16.5 mmol/L — ABNORMAL LOW (ref 20.0–28.0)
Bicarbonate: 14 mmol/L — ABNORMAL LOW (ref 20.0–28.0)
Bicarbonate: 21 mmol/L (ref 20.0–28.0)
Calcium, Ion: 1.06 mmol/L — ABNORMAL LOW (ref 1.15–1.40)
Calcium, Ion: 1.03 mmol/L — ABNORMAL LOW (ref 1.15–1.40)
Calcium, Ion: 1.01 mmol/L — ABNORMAL LOW (ref 1.15–1.40)
Calcium, Ion: 0.97 mmol/L — ABNORMAL LOW (ref 1.15–1.40)
HCT: 35 % — ABNORMAL LOW (ref 39.0–52.0)
HCT: 37 % — ABNORMAL LOW (ref 39.0–52.0)
HCT: 35 % — ABNORMAL LOW (ref 39.0–52.0)
HCT: 30 % — ABNORMAL LOW (ref 39.0–52.0)
Hemoglobin: 11.9 g/dL — ABNORMAL LOW (ref 13.0–17.0)
Hemoglobin: 12.6 g/dL — ABNORMAL LOW (ref 13.0–17.0)
Hemoglobin: 11.9 g/dL — ABNORMAL LOW (ref 13.0–17.0)
Hemoglobin: 10.2 g/dL — ABNORMAL LOW (ref 13.0–17.0)
O2 Saturation: 89 %
O2 Saturation: 100 %
O2 Saturation: 100 %
O2 Saturation: 100 %
Patient temperature: 97.3
Patient temperature: 36.8
Potassium: 5.3 mmol/L — ABNORMAL HIGH (ref 3.5–5.1)
Potassium: 4.6 mmol/L (ref 3.5–5.1)
Potassium: 4.6 mmol/L (ref 3.5–5.1)
Potassium: 3.7 mmol/L (ref 3.5–5.1)
Sodium: 137 mmol/L (ref 135–145)
Sodium: 134 mmol/L — ABNORMAL LOW (ref 135–145)
Sodium: 135 mmol/L (ref 135–145)
Sodium: 135 mmol/L (ref 135–145)
TCO2: 19 mmol/L — ABNORMAL LOW (ref 22–32)
TCO2: 18 mmol/L — ABNORMAL LOW (ref 22–32)
TCO2: 15 mmol/L — ABNORMAL LOW (ref 22–32)
TCO2: 22 mmol/L (ref 22–32)
pCO2 arterial: 35.1 mmHg (ref 32.0–48.0)
pCO2 arterial: 34.9 mmHg (ref 32.0–48.0)
pCO2 arterial: 34.8 mmHg (ref 32.0–48.0)
pCO2 arterial: 24 mmHg — ABNORMAL LOW (ref 32.0–48.0)
pH, Arterial: 7.315 — ABNORMAL LOW (ref 7.350–7.450)
pH, Arterial: 7.282 — ABNORMAL LOW (ref 7.350–7.450)
pH, Arterial: 7.207 — ABNORMAL LOW (ref 7.350–7.450)
pH, Arterial: 7.55 — ABNORMAL HIGH (ref 7.350–7.450)
pO2, Arterial: 60 mmHg — ABNORMAL LOW (ref 83.0–108.0)
pO2, Arterial: 289 mmHg — ABNORMAL HIGH (ref 83.0–108.0)
pO2, Arterial: 233 mmHg — ABNORMAL HIGH (ref 83.0–108.0)
pO2, Arterial: 223 mmHg — ABNORMAL HIGH (ref 83.0–108.0)

## 2021-07-19 LAB — CBC WITH DIFFERENTIAL/PLATELET
Abs Immature Granulocytes: 0.03 10*3/uL (ref 0.00–0.07)
Basophils Absolute: 0.1 10*3/uL (ref 0.0–0.1)
Basophils Relative: 2 %
Eosinophils Absolute: 0.2 10*3/uL (ref 0.0–0.5)
Eosinophils Relative: 2 %
HCT: 32.8 % — ABNORMAL LOW (ref 39.0–52.0)
Hemoglobin: 10.3 g/dL — ABNORMAL LOW (ref 13.0–17.0)
Immature Granulocytes: 1 %
Lymphocytes Relative: 13 %
Lymphs Abs: 0.8 10*3/uL (ref 0.7–4.0)
MCH: 33.6 pg (ref 26.0–34.0)
MCHC: 31.4 g/dL (ref 30.0–36.0)
MCV: 106.8 fL — ABNORMAL HIGH (ref 80.0–100.0)
Monocytes Absolute: 0.7 10*3/uL (ref 0.1–1.0)
Monocytes Relative: 10 %
Neutro Abs: 4.8 10*3/uL (ref 1.7–7.7)
Neutrophils Relative %: 72 %
Platelets: 142 10*3/uL — ABNORMAL LOW (ref 150–400)
RBC: 3.07 MIL/uL — ABNORMAL LOW (ref 4.22–5.81)
RDW: 21.4 % — ABNORMAL HIGH (ref 11.5–15.5)
WBC: 6.6 10*3/uL (ref 4.0–10.5)
nRBC: 0 % (ref 0.0–0.2)

## 2021-07-19 LAB — TROPONIN I (HIGH SENSITIVITY): Troponin I (High Sensitivity): 168 ng/L (ref <18)

## 2021-07-19 LAB — CBC
HCT: 34.1 % — ABNORMAL LOW (ref 39.0–52.0)
Hemoglobin: 12.8 g/dL — ABNORMAL LOW (ref 13.0–17.0)
MCH: 40.6 pg — ABNORMAL HIGH (ref 26.0–34.0)
MCHC: 37.5 g/dL — ABNORMAL HIGH (ref 30.0–36.0)
MCV: 108.3 fL — ABNORMAL HIGH (ref 80.0–100.0)
Platelets: 145 10*3/uL — ABNORMAL LOW (ref 150–400)
RBC: 3.15 MIL/uL — ABNORMAL LOW (ref 4.22–5.81)
RDW: 21.7 % — ABNORMAL HIGH (ref 11.5–15.5)
WBC: 8.9 10*3/uL (ref 4.0–10.5)
nRBC: 0.7 % — ABNORMAL HIGH (ref 0.0–0.2)

## 2021-07-19 LAB — ECHOCARDIOGRAM LIMITED
Height: 68 in
Weight: 2151.69 oz

## 2021-07-19 LAB — TYPE AND SCREEN
ABO/RH(D): A POS
Antibody Screen: NEGATIVE

## 2021-07-19 LAB — GLUCOSE, CAPILLARY
Glucose-Capillary: 105 mg/dL — ABNORMAL HIGH (ref 70–99)
Glucose-Capillary: 149 mg/dL — ABNORMAL HIGH (ref 70–99)
Glucose-Capillary: 219 mg/dL — ABNORMAL HIGH (ref 70–99)
Glucose-Capillary: 38 mg/dL — CL (ref 70–99)
Glucose-Capillary: 60 mg/dL — ABNORMAL LOW (ref 70–99)

## 2021-07-19 LAB — BASIC METABOLIC PANEL
Anion gap: 16 — ABNORMAL HIGH (ref 5–15)
BUN: 49 mg/dL — ABNORMAL HIGH (ref 8–23)
CO2: 19 mmol/L — ABNORMAL LOW (ref 22–32)
Calcium: 8.1 mg/dL — ABNORMAL LOW (ref 8.9–10.3)
Chloride: 100 mmol/L (ref 98–111)
Creatinine, Ser: 8.33 mg/dL — ABNORMAL HIGH (ref 0.61–1.24)
GFR, Estimated: 7 mL/min — ABNORMAL LOW (ref >60)
Glucose, Bld: 82 mg/dL (ref 70–99)
Potassium: 4.9 mmol/L (ref 3.5–5.1)
Sodium: 135 mmol/L (ref 135–145)

## 2021-07-19 LAB — LACTIC ACID, PLASMA: Lactic Acid, Venous: 7.6 mmol/L (ref 0.5–1.9)

## 2021-07-19 SURGERY — CANCELLED PROCEDURE
Anesthesia: Regional | Laterality: Right

## 2021-07-19 MED ORDER — FAT EMULSION 20 % BOLUS-LOCAL ANES INDUCED CARDIAC ARREST - OPTIME
INTRAVENOUS | Status: DC | PRN
  Administered 2021-07-19: 250 mL via INTRAVENOUS

## 2021-07-19 MED ORDER — MIDAZOLAM HCL 2 MG/2ML IJ SOLN: 1.0000 mg | Freq: Once | INTRAMUSCULAR | Status: DC

## 2021-07-19 MED ORDER — FENTANYL CITRATE (PF) 250 MCG/5ML IJ SOLN
INTRAMUSCULAR | Status: AC
  Filled 2021-07-19: qty 5

## 2021-07-19 MED ORDER — FLUMAZENIL 0.5 MG/5ML IV SOLN
INTRAVENOUS | Status: DC | PRN
  Administered 2021-07-19: .5 mg via INTRAVENOUS

## 2021-07-19 MED ORDER — CEFAZOLIN SODIUM-DEXTROSE 2-4 GM/100ML-% IV SOLN: 2.0000 g | Freq: Once | INTRAVENOUS | Status: DC

## 2021-07-19 MED ORDER — HEPARIN 6000 UNIT IRRIGATION SOLUTION
Status: DC | PRN
  Administered 2021-07-19: 1

## 2021-07-19 MED ORDER — HEPARIN 6000 UNIT IRRIGATION SOLUTION
Status: AC
  Filled 2021-07-19: qty 500

## 2021-07-19 MED ORDER — GUAIFENESIN-DM 100-10 MG/5ML PO SYRP
10.0000 mL | ORAL_SOLUTION | ORAL | Status: DC | PRN
  Administered 2021-07-23 – 2021-07-24 (×2): 10 mL
  Filled 2021-07-19 (×2): qty 10

## 2021-07-19 MED ORDER — PRISMASOL BGK 4/2.5 32-4-2.5 MEQ/L REPLACEMENT SOLN: Status: DC

## 2021-07-19 MED ORDER — SODIUM CHLORIDE 0.9 % FOR CRRT: INTRAVENOUS_CENTRAL | Status: DC | PRN

## 2021-07-19 MED ORDER — MIDODRINE HCL 5 MG PO TABS
10.0000 mg | ORAL_TABLET | Freq: Two times a day (BID) | ORAL | Status: DC
Start: 2021-07-19 — End: 2021-07-19

## 2021-07-19 MED ORDER — CHLORHEXIDINE GLUCONATE 0.12 % MT SOLN: 15.0000 mL | Freq: Once | OROMUCOSAL | Status: AC

## 2021-07-19 MED ORDER — SODIUM CHLORIDE 0.9 % IV SOLN
350.0000 [IU]/h | INTRAVENOUS | Status: DC
  Administered 2021-07-19 – 2021-07-22 (×4): 350 [IU]/h via INTRAVENOUS_CENTRAL
  Filled 2021-07-19 (×4): qty 2
  Filled 2021-07-19: qty 10000
  Filled 2021-07-19: qty 2

## 2021-07-19 MED ORDER — FENTANYL CITRATE (PF) 100 MCG/2ML IJ SOLN: 50.0000 ug | Freq: Once | INTRAMUSCULAR | Status: DC

## 2021-07-19 MED ORDER — CEFAZOLIN SODIUM-DEXTROSE 2-4 GM/100ML-% IV SOLN
INTRAVENOUS | Status: AC
  Filled 2021-07-19: qty 100

## 2021-07-19 MED ORDER — FENTANYL CITRATE PF 50 MCG/ML IJ SOSY
50.0000 ug | PREFILLED_SYRINGE | INTRAMUSCULAR | Status: DC | PRN
  Administered 2021-07-21 (×3): 50 ug via INTRAVENOUS
  Filled 2021-07-19 (×4): qty 1

## 2021-07-19 MED ORDER — DEXTROSE 50 % IV SOLN
INTRAVENOUS | Status: AC
  Administered 2021-07-19: 50 mL
  Filled 2021-07-19: qty 50

## 2021-07-19 MED ORDER — CHLORHEXIDINE GLUCONATE 0.12 % MT SOLN
OROMUCOSAL | Status: AC
  Administered 2021-07-19: 15 mL via OROMUCOSAL
  Filled 2021-07-19: qty 15

## 2021-07-19 MED ORDER — EPINEPHRINE 1 MG/10ML IJ SOSY
PREFILLED_SYRINGE | INTRAMUSCULAR | Status: DC | PRN
  Administered 2021-07-19: 1 mg via INTRAVENOUS

## 2021-07-19 MED ORDER — NALOXONE HCL 0.4 MG/ML IJ SOLN
INTRAMUSCULAR | Status: AC
  Filled 2021-07-19: qty 1

## 2021-07-19 MED ORDER — PANTOPRAZOLE 2 MG/ML SUSPENSION
40.0000 mg | Freq: Every day | ORAL | Status: DC
  Administered 2021-07-20 – 2021-07-21 (×2): 40 mg
  Filled 2021-07-19 (×2): qty 20

## 2021-07-19 MED ORDER — ACETAMINOPHEN 325 MG PO TABS: 650.0000 mg | ORAL_TABLET | Freq: Four times a day (QID) | ORAL | Status: DC | PRN

## 2021-07-19 MED ORDER — RENA-VITE PO TABS
1.0000 | ORAL_TABLET | Freq: Every day | ORAL | Status: DC
  Administered 2021-07-19 – 2021-07-20 (×2): 1
  Filled 2021-07-19 (×2): qty 1

## 2021-07-19 MED ORDER — ACETAMINOPHEN 500 MG PO TABS
ORAL_TABLET | ORAL | Status: AC
  Administered 2021-07-19: 1000 mg via ORAL
  Filled 2021-07-19: qty 2

## 2021-07-19 MED ORDER — VASOPRESSIN 20 UNIT/ML IV SOLN
INTRAVENOUS | Status: AC
  Filled 2021-07-19: qty 1

## 2021-07-19 MED ORDER — INSULIN ASPART 100 UNIT/ML IJ SOLN
0.0000 [IU] | INTRAMUSCULAR | Status: DC
Start: 2021-07-19 — End: 2021-07-23
  Administered 2021-07-19 – 2021-07-22 (×3): 1 [IU] via SUBCUTANEOUS

## 2021-07-19 MED ORDER — PHENYLEPHRINE HCL-NACL 20-0.9 MG/250ML-% IV SOLN
INTRAVENOUS | Status: DC | PRN
  Administered 2021-07-19: 100 ug/min via INTRAVENOUS

## 2021-07-19 MED ORDER — SODIUM BICARBONATE 8.4 % IV SOLN
100.0000 meq | Freq: Once | INTRAVENOUS | Status: AC
  Administered 2021-07-19: 100 meq via INTRAVENOUS
  Filled 2021-07-19: qty 50

## 2021-07-19 MED ORDER — POLYETHYLENE GLYCOL 3350 17 G PO PACK
17.0000 g | PACK | Freq: Every day | ORAL | Status: DC
  Administered 2021-07-20 – 2021-07-21 (×2): 17 g
  Filled 2021-07-19 (×2): qty 1

## 2021-07-19 MED ORDER — PROPOFOL 1000 MG/100ML IV EMUL
0.0000 ug/kg/min | INTRAVENOUS | Status: DC
  Administered 2021-07-19 (×2): 50 ug/kg/min via INTRAVENOUS
  Administered 2021-07-20: 35 ug/kg/min via INTRAVENOUS
  Filled 2021-07-19 (×2): qty 100

## 2021-07-19 MED ORDER — HEPARIN SODIUM (PORCINE) 1000 UNIT/ML DIALYSIS
2000.0000 [IU] | INTRAMUSCULAR | Status: AC | PRN
  Administered 2021-07-22 (×2): 2000 [IU] via INTRAVENOUS_CENTRAL

## 2021-07-19 MED ORDER — PROPOFOL 10 MG/ML IV BOLUS
INTRAVENOUS | Status: AC
  Filled 2021-07-19: qty 20

## 2021-07-19 MED ORDER — PRISMASOL BGK 4/2.5 32-4-2.5 MEQ/L EC SOLN: Status: DC

## 2021-07-19 MED ORDER — MIDODRINE HCL 5 MG PO TABS
10.0000 mg | ORAL_TABLET | Freq: Three times a day (TID) | ORAL | Status: DC
Start: 2021-07-19 — End: 2021-07-21
  Administered 2021-07-19 – 2021-07-21 (×6): 10 mg
  Filled 2021-07-19 (×6): qty 2

## 2021-07-19 MED ORDER — ORAL CARE MOUTH RINSE: 15.0000 mL | Freq: Once | OROMUCOSAL | Status: AC

## 2021-07-19 MED ORDER — FAT EMULSION PLANT BASED 20% (INTRALIPID)IV EMUL
INTRAVENOUS | Status: AC
  Administered 2021-07-19: 50 g
  Filled 2021-07-19: qty 250

## 2021-07-19 MED ORDER — NOREPINEPHRINE 4 MG/250ML-% IV SOLN
0.0000 ug/min | INTRAVENOUS | Status: DC
  Administered 2021-07-19: 22 ug/min via INTRAVENOUS
  Administered 2021-07-19: 9 ug/min via INTRAVENOUS
  Administered 2021-07-20: 11 ug/min via INTRAVENOUS
  Administered 2021-07-20: 9 ug/min via INTRAVENOUS
  Filled 2021-07-19 (×5): qty 250

## 2021-07-19 MED ORDER — NOREPINEPHRINE 4 MG/250ML-% IV SOLN
INTRAVENOUS | Status: AC
  Administered 2021-07-19: 12 ug/min via INTRAVENOUS
  Filled 2021-07-19: qty 250

## 2021-07-19 MED ORDER — ALTEPLASE 2 MG IJ SOLR: 2.0000 mg | Freq: Once | INTRAMUSCULAR | Status: DC | PRN

## 2021-07-19 MED ORDER — HEPARIN SODIUM (PORCINE) 1000 UNIT/ML DIALYSIS
1000.0000 [IU] | INTRAMUSCULAR | Status: DC | PRN
  Administered 2021-07-23: 4000 [IU] via INTRAVENOUS_CENTRAL
  Filled 2021-07-19 (×3): qty 6

## 2021-07-19 MED ORDER — NALOXONE HCL 0.4 MG/ML IJ SOLN
INTRAMUSCULAR | Status: AC | PRN
  Administered 2021-07-19: 20 ug via INTRAVENOUS
  Administered 2021-07-19: 40 ug via INTRAVENOUS

## 2021-07-19 MED ORDER — FAT EMULSION 20 % INFUSION-LOCAL ANES INDUCED CARDIAC ARREST - OPTIME
0.2500 mL/kg/min | INTRAVENOUS | Status: DC
  Filled 2021-07-19 (×2): qty 500

## 2021-07-19 MED ORDER — SODIUM CHLORIDE 0.9 % IV SOLN: INTRAVENOUS | Status: DC

## 2021-07-19 MED ORDER — OXYCODONE HCL 5 MG PO TABS: 5.0000 mg | ORAL_TABLET | Freq: Four times a day (QID) | ORAL | Status: DC | PRN

## 2021-07-19 MED ORDER — MIDAZOLAM HCL 2 MG/2ML IJ SOLN
INTRAMUSCULAR | Status: AC
  Administered 2021-07-19: 1 mg via INTRAVENOUS
  Filled 2021-07-19: qty 2

## 2021-07-19 MED ORDER — ACETAMINOPHEN 325 MG PO TABS
650.0000 mg | ORAL_TABLET | Freq: Four times a day (QID) | ORAL | Status: DC
  Administered 2021-07-19 – 2021-07-20 (×5): 650 mg
  Filled 2021-07-19 (×7): qty 2

## 2021-07-19 MED ORDER — LIDOCAINE-EPINEPHRINE (PF) 1 %-1:200000 IJ SOLN
INTRAMUSCULAR | Status: AC
  Filled 2021-07-19: qty 30

## 2021-07-19 MED ORDER — IPRATROPIUM-ALBUTEROL 0.5-2.5 (3) MG/3ML IN SOLN
3.0000 mL | Freq: Four times a day (QID) | RESPIRATORY_TRACT | Status: DC
  Administered 2021-07-19 – 2021-07-21 (×7): 3 mL via RESPIRATORY_TRACT
  Filled 2021-07-19 (×7): qty 3

## 2021-07-19 MED ORDER — MIDAZOLAM HCL 2 MG/2ML IJ SOLN
INTRAMUSCULAR | Status: AC
  Filled 2021-07-19: qty 2

## 2021-07-19 MED ORDER — SODIUM CHLORIDE 0.9 % IV SOLN
2.0000 g | INTRAVENOUS | Status: DC
  Administered 2021-07-19 – 2021-07-20 (×2): 2 g via INTRAVENOUS
  Filled 2021-07-19 (×3): qty 20

## 2021-07-19 MED ORDER — FENTANYL CITRATE PF 50 MCG/ML IJ SOSY
50.0000 ug | PREFILLED_SYRINGE | INTRAMUSCULAR | Status: AC | PRN
  Administered 2021-07-19 – 2021-07-20 (×3): 50 ug via INTRAVENOUS
  Filled 2021-07-19 (×2): qty 1

## 2021-07-19 MED ORDER — NEPRO/CARBSTEADY PO LIQD: 237.0000 mL | Freq: Two times a day (BID) | ORAL | Status: DC

## 2021-07-19 MED ORDER — ACETAMINOPHEN 650 MG RE SUPP: 650.0000 mg | Freq: Four times a day (QID) | RECTAL | Status: DC | PRN

## 2021-07-19 MED ORDER — PROPOFOL 500 MG/50ML IV EMUL
INTRAVENOUS | Status: DC | PRN
  Administered 2021-07-19: 50 ug/kg/min via INTRAVENOUS

## 2021-07-19 MED ORDER — FLUMAZENIL 0.5 MG/5ML IV SOLN
INTRAVENOUS | Status: AC
  Filled 2021-07-19: qty 5

## 2021-07-19 MED ORDER — FENTANYL CITRATE (PF) 100 MCG/2ML IJ SOLN
INTRAMUSCULAR | Status: AC
  Administered 2021-07-19: 50 ug via INTRAVENOUS
  Filled 2021-07-19: qty 2

## 2021-07-19 MED ORDER — LIDOCAINE-EPINEPHRINE 2 %-1:100000 IJ SOLN
INTRAMUSCULAR | Status: DC | PRN
  Administered 2021-07-19: 20 mL via PERINEURAL

## 2021-07-19 MED ORDER — ACETAMINOPHEN 500 MG PO TABS: 1000.0000 mg | ORAL_TABLET | Freq: Once | ORAL | Status: AC

## 2021-07-19 MED ORDER — DOCUSATE SODIUM 50 MG/5ML PO LIQD
100.0000 mg | Freq: Two times a day (BID) | ORAL | Status: DC
  Administered 2021-07-19 – 2021-07-21 (×4): 100 mg
  Filled 2021-07-19 (×4): qty 10

## 2021-07-19 NOTE — Progress Notes (Signed)
Patient suffered cardiopulmonary arrest after preoperative block.  ROSC achieved after several minutes of CPR. Epinephrine administered x1. Patient intubated.  Bedside ultrasound shows no evidence of pneumothorax. CXR pending. Will transfer to ICU for post arrest care. Will plan to do RUE AVG as outpatient.  Thomas Robinson. Stanford Breed, MD [facility] Phone Number: [phone] 07/19/2021 10:36 AM

## 2021-07-19 NOTE — Progress Notes (Signed)
[facility] [place] PROGRESS NOTE  ASSESSMENT / PLAN: Thomas Robinson is a 60 y.o. male with poorly matured right first stage basilic vein arteriovenous fistula. Will plan to convert this to a arteriovenous graft today in OR.    SUBJECTIVE: No complaints. Reviewed duplex findings.   OBJECTIVE: BP 92/69 (BP Location: Left Arm)   Pulse (!) 111   Temp 98.1 F (36.7 C) (Oral)   Resp 18   Ht 5\' 8"  (1.727 m)   Wt 61 kg   SpO2 99%   BMI 20.45 kg/m   Intake/Output Summary (Last 24 hours) at 07/19/2021 0908 Last data filed at 07/18/2021 1700 Gross per 24 hour  Intake 480 ml  Output --  Net 480 ml    No acute distress Regular rate and rhythm Unlabored breathing Non-distended abdomen Right arm AVF with thrill  CBC Latest Ref Rng & Units 07/19/2021 07/18/2021 07/17/2021  WBC 4.0 - 10.5 K/uL 6.6 5.7 6.8  Hemoglobin 13.0 - 17.0 g/dL 10.3(L) 9.8(L) 9.5(L)  Hematocrit 39.0 - 52.0 % 32.8(L) 31.4(L) 31.1(L)  Platelets 150 - 400 K/uL 142(L) 125(L) 136(L)     CMP Latest Ref Rng & Units 07/19/2021 07/18/2021 07/17/2021  Glucose 70 - 99 mg/dL 82 115(H) 98  BUN 8 - 23 mg/dL 49(H) 39(H) 61(H)  Creatinine 0.61 - 1.24 mg/dL 8.33(H) 7.38(H) 10.45(H)  Sodium 135 - 145 mmol/L 135 137 138  Potassium 3.5 - 5.1 mmol/L 4.9 4.3 4.6  Chloride 98 - 111 mmol/L 100 102 105  CO2 22 - 32 mmol/L 19(L) 21(L) 15(L)  Calcium 8.9 - 10.3 mg/dL 8.1(L) 8.8(L) 8.9  Total Protein 6.5 - 8.1 g/dL - - 6.1(L)  Total Bilirubin 0.3 - 1.2 mg/dL - - 1.0  Alkaline Phos 38 - 126 U/L - - 129(H)  AST 15 - 41 U/L - - 16  ALT 0 - 44 U/L - - 65(H)    Estimated Creatinine Clearance: 7.9 mL/min (A) (by C-G formula based on SCr of 8.33 mg/dL (H)).   AVF duplex  Findings:  +--------------------+----------+-----------------+--------+  AVF                 PSV (cm/s)Flow Vol (mL/min)Comments  +--------------------+----------+-----------------+--------+  Native artery inflow   154            389                 +--------------------+----------+-----------------+--------+  AVF Anastomosis        560                               +--------------------+----------+-----------------+--------+      +------------+----------+-------------+----------+--------+  OUTFLOW VEINPSV (cm/s)Diameter (cm)Depth (cm)Describe  +------------+----------+-------------+----------+--------+  Prox UA         49        0.94        0.81             +------------+----------+-------------+----------+--------+  Mid UA         327        0.44        0.68             +------------+----------+-------------+----------+--------+  Dist UA        593        0.34        0.27             +------------+----------+-------------+----------+--------+   Thomas Robinson. Stanford Breed, MD [facility] Phone Number: [phone] 07/19/2021 9:08 AM

## 2021-07-19 NOTE — Progress Notes (Signed)
ABG reviewed, running out of room with vent to correct pH.  Bicarb gtt not great option given volume overload issues.  Unfortunately will have to do CRRT, discussed with Dr. Jonnie Finner.  Erskine Emery MD PCCM

## 2021-07-19 NOTE — Procedures (Signed)
Central Venous Catheter Insertion Procedure Note Thomas Robinson [id number] 26-Dec-1960  Procedure: Insertion of Central Venous Catheter Indications: Assessment of intravascular volume, Drug and/or fluid administration, and Frequent blood sampling  Procedure Details Consent: Risks of procedure as well as the alternatives and risks of each were explained to the (patient/caregiver).  Consent for procedure obtained. Time Out: Verified patient identification, verified procedure, site/side was marked, verified correct patient position, special equipment/implants available, medications/allergies/relevent history reviewed, required imaging and test results available.  Performed  Maximum sterile technique was used including antiseptics, cap, gloves, gown, hand hygiene, mask, and sheet. Skin prep: Chlorhexidine; local anesthetic administered A antimicrobial bonded/coated triple lumen catheter was placed in the left internal jugular vein using the Seldinger technique. Ultrasound guidance used.Yes.   Catheter placed to 20 cm. Blood aspirated via all 3 ports and then flushed x 3. Line sutured x 2 and dressing applied.  Evaluation Blood flow good Complications: No apparent complications Patient did tolerate procedure well. Chest X-ray ordered to verify placement.  CXR: pending.  Thomas Robinson ACNP Acute Care Nurse Practitioner [name] Please consult Amion 07/19/2021, 11:44 AM

## 2021-07-19 NOTE — Care Management Important Message (Signed)
Important Message  Patient Details  Name: Thomas Robinson MRN: [id number] Date of Birth: Jan 06, 1961   Medicare Important Message Given:  Yes     Orbie Pyo 07/19/2021, 2:22 PM

## 2021-07-19 NOTE — Progress Notes (Signed)
Patient ID: Thomas Robinson, male   DOB: [birth year], 60 y.o.   MRN: [id number]  PROGRESS NOTE    Thomas Robinson  XBD:[id number] DOB: 09/24/1960 DOA: 07/16/2021 PCP: [name], PA   Brief Narrative:  60 year old male with history of chronic hypoxic respiratory failure on 2.5 L oxygen via nasal cannula, chronic diastolic CHF, end-stage renal disease on hemodialysis, thoracic aortic aneurysm, atrial flutter on Eliquis, chronic anemia, secondary hyperparathyroidism, hypertension, hyperlipidemia, recent admission from 07/07/2021 and patient left AMA on 07/11/2021 for GI bleeding needing EGD/colonoscopy on 07/10/2021 which showed internal hemorrhoids, barotrauma, gastritis but no active bleeding and GI recommended Protonix 40 mg twice daily x6 weeks and to resume Eliquis from 07/13/2021 onwards which patient has not done so far.  His last dialysis session was 07/10/2021.  Before signing out AMA, he had contemplated home hospice but wanted to remain full code; tunneled dialysis catheter was removed prior to him signing out [place].  He presented again on 07/16/2021 with chest pain.  On presentation, he had troponins of 74 and 75; chest x-ray findings suggestive of CHF with pulmonary edema and small right-sided pleural effusion.  Nephrology was consulted.  He underwent tunneled dialysis catheter placement by IR on 07/17/2021.  Vascular surgery was also consulted.  Assessment & Plan:   Possible volume overload/pulmonary edema secondary to missed dialysis End-stage renal disease on hemodialysis Chest pain: Possibly from above -Presented with chest pain and shortness of breath because of volume overload.  Nephrology following. -Tunneled catheter was removed last week before patient signed out [place].   -Status post tunneled hemodialysis catheter placement by IR on 07/17/2021.  Dialysis as per nephrology schedule -Troponins did not trend up.  EKG did not show significant ST  elevation/depression -Patient was contemplating home hospice during last hospitalization but currently wants to continue hemodialysis.  Palliative care evaluation appreciated.  Currently remains full code.  Patient did not engage in palliative care discussions. -Vascular surgery is planning on second stage basilic vein fistula creation.  Paroxysmal atrial flutter -Diagnosed during last hospitalization.  Cardiology had recommended TEE/DCCV when recovered from illness: Possibly as an outpatient in 2 to 4 weeks -Patient has not started back his Eliquis on 07/13/2021 as advised by GI. -Currently still tachycardic.  Cardiology had recommended to increase metoprolol to 50 mg twice a day during hospitalization. -Eliquis on hold for now for possible vascular procedures.  Balanitis -Continue renally dosed Keflex and topical mupirocin  Diarrhea -Stool studies negative for C. difficile and GI PCR.  Diarrhea improving.  Acute on chronic diastolic heart failure -Volume being managed by dialysis.  Strict input and output.  Daily weights.  Fluid restriction.  History of unspecified CVA -Resume Eliquis once procedures have been completed  Thoracic ascending aortic aneurysm -On CT in September 2022.  Needs semiannual imaging and outpatient follow-up  Anemia of chronic disease -Hemoglobin stable.  From renal failure.  Thrombocytopenia -Questionable cause.  Monitor  Coccygeal stage II pressure ulcer: Present on admission -Follow wound care consultation recommendations    DVT prophylaxis: SCDs Code Status: Full Family Communication: None at bedside Disposition Plan: Status is: Inpatient  Remains inpatient appropriate because: Of need for resumption of hemodialysis  Consultants: Nephrology/IR/vascular surgery.   palliative care  Procedures: Tunneled hemodialysis catheter placement by IR on 07/17/2021  Antimicrobials:  Anti-infectives (From admission, onward)    Start     Dose/Rate Route  Frequency Ordered Stop   07/17/21 0730  ceFAZolin (ANCEF) IVPB 2g/100 mL premix  2 g 200 mL/hr over 30 Minutes Intravenous To Radiology 07/17/21 0634 07/17/21 0920   07/16/21 2200  cephALEXin (KEFLEX) capsule 500 mg       Note to Pharmacy: If need to do q 24 hours that is fine too   500 mg Oral Every 12 hours 07/16/21 1945 07/26/21 2159        Subjective: Patient seen and examined at bedside.  Poor historian.  No worsening shortness of breath, chest pain, fever or vomiting reported. Objective: Vitals:   07/18/21 0935 07/18/21 2017 07/18/21 2110 07/19/21 0541  BP: 100/66 90/64  92/69  Pulse: (!) 101 97  (!) 111  Resp: 18 17  18   Temp: 98.8 F (37.1 C) 97.8 F (36.6 C)  98.1 F (36.7 C)  TempSrc: Oral Oral  Oral  SpO2: 94% 99%  99%  Weight:   61 kg   Height:        Intake/Output Summary (Last 24 hours) at 07/19/2021 0738 Last data filed at 07/18/2021 1700 Gross per 24 hour  Intake 600 ml  Output --  Net 600 ml    Filed Weights   07/17/21 0213 07/17/21 1155 07/18/21 2110  Weight: 60.8 kg 61.9 kg 61 kg    Examination:  General exam: Looks chronically ill and deconditioned.  Still on 3 L oxygen via nasal cannula.  No acute distress.   Respiratory system: Bilateral decreased breath sounds at bases with scattered crackles  cardiovascular system: S1-S2 heard; intermittently tachycardic gastrointestinal system: Abdomen is mildly distended, soft and nontender.  Normal bowel sounds heard  extremities: No cyanosis; bilateral lower extremity trace edema present  Central nervous system: Alert but slow to respond to questions.  Extremely poor historian.  No focal neurological deficits.  Moving extremities  skin: No obvious ecchymosis/lesions Psychiatry: Affect is flat.  Does not participate in conversation much  Data Reviewed: I have personally reviewed following labs and imaging studies  CBC: Recent Labs  Lab 07/16/21 0922 07/17/21 0232 07/18/21 0348 07/19/21 0338   WBC 6.5 6.8 5.7 6.6  NEUTROABS  --   --  4.2 4.8  HGB 9.4* 9.5* 9.8* 10.3*  HCT 31.4* 31.1* 31.4* 32.8*  MCV 112.5* 109.1* 108.7* 106.8*  PLT 122* 136* 125* 142*    Basic Metabolic Panel: Recent Labs  Lab 07/16/21 0922 07/17/21 0232 07/18/21 0348 07/19/21 0338  NA 140 138 137 135  K 4.6 4.6 4.3 4.9  CL 107 105 102 100  CO2 15* 15* 21* 19*  GLUCOSE 99 98 115* 82  BUN 58* 61* 39* 49*  CREATININE 10.28* 10.45* 7.38* 8.33*  CALCIUM 9.2 8.9 8.8* 8.1*    GFR: Estimated Creatinine Clearance: 7.9 mL/min (A) (by C-G formula based on SCr of 8.33 mg/dL (H)). Liver Function Tests: Recent Labs  Lab 07/17/21 0232  AST 16  ALT 65*  ALKPHOS 129*  BILITOT 1.0  PROT 6.1*  ALBUMIN 2.5*    No results for input(s): LIPASE, AMYLASE in the last 168 hours. No results for input(s): AMMONIA in the last 168 hours. Coagulation Profile: Recent Labs  Lab 07/17/21 0648  INR 1.4*    Cardiac Enzymes: No results for input(s): CKTOTAL, CKMB, CKMBINDEX, TROPONINI in the last 168 hours. BNP (last 3 results) No results for input(s): PROBNP in the last 8760 hours. HbA1C: No results for input(s): HGBA1C in the last 72 hours. CBG: No results for input(s): GLUCAP in the last 168 hours. Lipid Profile: No results for input(s): CHOL, HDL, LDLCALC, TRIG, CHOLHDL, LDLDIRECT in  the last 72 hours. Thyroid Function Tests: No results for input(s): TSH, T4TOTAL, FREET4, T3FREE, THYROIDAB in the last 72 hours. Anemia Panel: No results for input(s): VITAMINB12, FOLATE, FERRITIN, TIBC, IRON, RETICCTPCT in the last 72 hours. Sepsis Labs: No results for input(s): PROCALCITON, LATICACIDVEN in the last 168 hours.  Recent Results (from the past 240 hour(s))  Resp Panel by RT-PCR (Flu A&B, Covid) Nasopharyngeal Swab     Status: None   Collection Time: 07/16/21  7:42 PM   Specimen: Nasopharyngeal Swab; Nasopharyngeal(NP) swabs in vial transport medium  Result Value Ref Range Status   SARS Coronavirus 2 by  RT PCR NEGATIVE NEGATIVE Final    Comment: (NOTE) SARS-CoV-2 target nucleic acids are NOT DETECTED.  The SARS-CoV-2 RNA is generally detectable in upper respiratory specimens during the acute phase of infection. The lowest concentration of SARS-CoV-2 viral copies this assay can detect is 138 copies/mL. A negative result does not preclude SARS-Cov-2 infection and should not be used as the sole basis for treatment or other patient management decisions. A negative result may occur with  improper specimen collection/handling, submission of specimen other than nasopharyngeal swab, presence of viral mutation(s) within the areas targeted by this assay, and inadequate number of viral copies(<138 copies/mL). A negative result must be combined with clinical observations, patient history, and epidemiological information. The expected result is Negative.  Fact Sheet for Patients:  EntrepreneurPulse.com.au  Fact Sheet for Healthcare Providers:  IncredibleEmployment.be  This test is no t yet approved or cleared by the [place] FDA and  has been authorized for detection and/or diagnosis of SARS-CoV-2 by FDA under an Emergency Use Authorization (EUA). This EUA will remain  in effect (meaning this test can be used) for the duration of the COVID-19 declaration under Section 564(b)(1) of the Act, 21 U.S.C.section 360bbb-3(b)(1), unless the authorization is terminated  or revoked sooner.       Influenza A by PCR NEGATIVE NEGATIVE Final   Influenza B by PCR NEGATIVE NEGATIVE Final    Comment: (NOTE) The Xpert Xpress SARS-CoV-2/FLU/RSV plus assay is intended as an aid in the diagnosis of influenza from Nasopharyngeal swab specimens and should not be used as a sole basis for treatment. Nasal washings and aspirates are unacceptable for Xpert Xpress SARS-CoV-2/FLU/RSV testing.  Fact Sheet for Patients: EntrepreneurPulse.com.au  Fact Sheet  for Healthcare Providers: IncredibleEmployment.be  This test is not yet approved or cleared by the [place] FDA and has been authorized for detection and/or diagnosis of SARS-CoV-2 by FDA under an Emergency Use Authorization (EUA). This EUA will remain in effect (meaning this test can be used) for the duration of the COVID-19 declaration under Section 564(b)(1) of the Act, 21 U.S.C. section 360bbb-3(b)(1), unless the authorization is terminated or revoked.  Performed at [facility], [place] [address] [place], [place] [address]   C Difficile Quick Screen w PCR reflex     Status: None   Collection Time: 07/17/21 11:30 AM   Specimen: Stool  Result Value Ref Range Status   C Diff antigen NEGATIVE NEGATIVE Final   C Diff toxin NEGATIVE NEGATIVE Final   C Diff interpretation No C. difficile detected.  Final    Comment: Performed at [facility], [place] [address], [place], [place] [address]  Gastrointestinal Panel by PCR , Stool     Status: None   Collection Time: 07/17/21  5:30 PM   Specimen: Stool  Result Value Ref Range Status   Campylobacter species NOT DETECTED NOT DETECTED  Final   Plesimonas shigelloides NOT DETECTED NOT DETECTED Final   Salmonella species NOT DETECTED NOT DETECTED Final   Yersinia enterocolitica NOT DETECTED NOT DETECTED Final   Vibrio species NOT DETECTED NOT DETECTED Final   Vibrio cholerae NOT DETECTED NOT DETECTED Final   Enteroaggregative E coli (EAEC) NOT DETECTED NOT DETECTED Final   Enteropathogenic E coli (EPEC) NOT DETECTED NOT DETECTED Final   Enterotoxigenic E coli (ETEC) NOT DETECTED NOT DETECTED Final   Shiga like toxin producing E coli (STEC) NOT DETECTED NOT DETECTED Final   Shigella/Enteroinvasive E coli (EIEC) NOT DETECTED NOT DETECTED Final   Cryptosporidium NOT DETECTED NOT DETECTED Final   Cyclospora cayetanensis NOT DETECTED NOT DETECTED Final   Entamoeba histolytica NOT DETECTED NOT DETECTED Final    Giardia lamblia NOT DETECTED NOT DETECTED Final   Adenovirus F40/41 NOT DETECTED NOT DETECTED Final   Astrovirus NOT DETECTED NOT DETECTED Final   Norovirus GI/GII NOT DETECTED NOT DETECTED Final   Rotavirus A NOT DETECTED NOT DETECTED Final   Sapovirus (I, II, IV, and V) NOT DETECTED NOT DETECTED Final    Comment: Performed at [facility], [address], [place], [place] [address]          Radiology Studies: IR Fluoro Guide CV Line Right  Result Date: 07/17/2021 INDICATION: ESRD requiring HD. EXAM: TUNNELED CENTRAL VENOUS HEMODIALYSIS CATHETER PLACEMENT WITH ULTRASOUND AND FLUOROSCOPIC GUIDANCE MEDICATIONS: Vancomycin 1 gm IV . The antibiotic was given in an appropriate time interval prior to skin puncture. ANESTHESIA/SEDATION: Moderate (conscious) sedation was employed during this procedure. A total of Versed 1 mg and Fentanyl 25 mcg was administered intravenously. Moderate Sedation Time: 15 minutes. The patient's level of consciousness and vital signs were monitored continuously by radiology nursing throughout the procedure under my direct supervision. FLUOROSCOPY TIME:  0 minutes 6 seconds (1 mGy). COMPLICATIONS: None immediate. PROCEDURE: Informed written consent was obtained from the patient after a discussion of the risks, benefits, and alternatives to treatment. Questions regarding the procedure were encouraged and answered. The RIGHT neck and chest were prepped with chlorhexidine in a sterile fashion, and a sterile drape was applied covering the operative field. Maximum barrier sterile technique with sterile gowns and gloves were used for the procedure. A timeout was performed prior to the initiation of the procedure. After creating a small venotomy incision, a micropuncture kit was utilized to access the internal jugular vein. Real-time ultrasound guidance was utilized for vascular access including the acquisition of a permanent ultrasound image documenting patency of the  accessed vessel. The microwire was utilized to measure appropriate catheter length. A stiff Glidewire was advanced to the level of the IVC and the micropuncture sheath was exchanged for a peel-away sheath. A hemosplit tunneled hemodialysis catheter measuring 23 cm from tip to cuff was tunneled in a retrograde fashion from the anterior chest wall to the venotomy incision. The catheter was then placed through the peel-away sheath with tips ultimately positioned within the superior aspect of the right atrium. Final catheter positioning was confirmed and documented with a spot radiographic image. The catheter aspirates and flushes normally. The catheter was flushed with appropriate volume heparin dwells. The catheter exit site was secured with a 0-silk retention suture. The venotomy incision was closed with an interrupted 2-0 Vicryl, then Dermabond was applied at the skin. Dressings were applied. The patient tolerated the procedure well without immediate post procedural complication. IMPRESSION: Successful placement of 23 cm tunneled hemodialysis catheter via the RIGHT internal jugular vein with tip  terminating within the proximal right atrium. The catheter is ready for immediate use. Michaelle Birks, MD [facility] Radiology Electronically Signed   By: Michaelle Birks M.D.   On: 07/17/2021 17:59   IR US Guide Vasc Access Right  Result Date: 07/17/2021 INDICATION: ESRD requiring HD. EXAM: TUNNELED CENTRAL VENOUS HEMODIALYSIS CATHETER PLACEMENT WITH ULTRASOUND AND FLUOROSCOPIC GUIDANCE MEDICATIONS: Vancomycin 1 gm IV . The antibiotic was given in an appropriate time interval prior to skin puncture. ANESTHESIA/SEDATION: Moderate (conscious) sedation was employed during this procedure. A total of Versed 1 mg and Fentanyl 25 mcg was administered intravenously. Moderate Sedation Time: 15 minutes. The patient's level of consciousness and vital signs were monitored continuously by  radiology nursing throughout the procedure under my direct supervision. FLUOROSCOPY TIME:  0 minutes 6 seconds (1 mGy). COMPLICATIONS: None immediate. PROCEDURE: Informed written consent was obtained from the patient after a discussion of the risks, benefits, and alternatives to treatment. Questions regarding the procedure were encouraged and answered. The RIGHT neck and chest were prepped with chlorhexidine in a sterile fashion, and a sterile drape was applied covering the operative field. Maximum barrier sterile technique with sterile gowns and gloves were used for the procedure. A timeout was performed prior to the initiation of the procedure. After creating a small venotomy incision, a micropuncture kit was utilized to access the internal jugular vein. Real-time ultrasound guidance was utilized for vascular access including the acquisition of a permanent ultrasound image documenting patency of the accessed vessel. The microwire was utilized to measure appropriate catheter length. A stiff Glidewire was advanced to the level of the IVC and the micropuncture sheath was exchanged for a peel-away sheath. A hemosplit tunneled hemodialysis catheter measuring 23 cm from tip to cuff was tunneled in a retrograde fashion from the anterior chest wall to the venotomy incision. The catheter was then placed through the peel-away sheath with tips ultimately positioned within the superior aspect of the right atrium. Final catheter positioning was confirmed and documented with a spot radiographic image. The catheter aspirates and flushes normally. The catheter was flushed with appropriate volume heparin dwells. The catheter exit site was secured with a 0-silk retention suture. The venotomy incision was closed with an interrupted 2-0 Vicryl, then Dermabond was applied at the skin. Dressings were applied. The patient tolerated the procedure well without immediate post procedural complication. IMPRESSION: Successful placement of 23  cm tunneled hemodialysis catheter via the RIGHT internal jugular vein with tip terminating within the proximal right atrium. The catheter is ready for immediate use. Michaelle Birks, MD [facility] Radiology Electronically Signed   By: Michaelle Birks M.D.   On: 07/17/2021 17:59   VAS US DUPLEX DIALYSIS ACCESS (AVF, AVG)  Result Date: 07/18/2021 DIALYSIS ACCESS Patient Name:  BRALYN FOLKERT  Date of Exam:   07/18/2021 Medical Rec #: [id number]         Accession #:    [id number] Date of Birth: March 12, 1961         Patient Gender: M Patient Age:   41 years Exam Location:  [facility] Procedure:      VAS US DUPLEX DIALYSIS ACCESS (AVF, AVG) Referring Phys: Risa Grill --------------------------------------------------------------------------------  Reason for Exam: Evaluation of stage one brachial-basilic AVF, patient lost to                  follow up after creation. Access Site: Right Upper Extremity. Access Type: Basilic vein transposition, first stage. History:  04-04-2021 Right brachio-basilic AVF creation.          02-02-2021 Ligation of left upper arm AVF.          01-10-2021 Ligation of left arm radiocephalic fistula, conversion to          brachiocephalic. Comparison Study: No recent prior studies. Performing Technologist: Darlin Coco RDMS, RVT  Examination Guidelines: A complete evaluation includes B-mode imaging, spectral Doppler, color Doppler, and power Doppler as needed of all accessible portions of each vessel. Unilateral testing is considered an integral part of a complete examination. Limited examinations for reoccurring indications may be performed as noted.  Findings: +--------------------+----------+-----------------+--------+ AVF                 PSV (cm/s)Flow Vol (mL/min)Comments +--------------------+----------+-----------------+--------+ Native artery inflow   154           389                 +--------------------+----------+-----------------+--------+ AVF Anastomosis        560                              +--------------------+----------+-----------------+--------+  +------------+----------+-------------+----------+--------+ OUTFLOW VEINPSV (cm/s)Diameter (cm)Depth (cm)Describe +------------+----------+-------------+----------+--------+ Prox UA         49        0.94        0.81            +------------+----------+-------------+----------+--------+ Mid UA         327        0.44        0.68            +------------+----------+-------------+----------+--------+ Dist UA        593        0.34        0.27            +------------+----------+-------------+----------+--------+   Summary: Patent stage one right arm brachial-basilic fistula. Refer to above tables for additional data.  *See table(s) above for measurements and observations.  Diagnosing physician: Orlie Pollen Electronically signed by Orlie Pollen on 07/18/2021 at 7:04:59 PM.    --------------------------------------------------------------------------------   Final         Scheduled Meds:  (feeding supplement) PROSource Plus  30 mL Oral BID BM   cephALEXin  500 mg Oral Q12H   Chlorhexidine Gluconate Cloth  6 each Topical Q0600   cinacalcet  60 mg Oral QPM   feeding supplement (NEPRO CARB STEADY)  237 mL Oral BID BM   lidocaine  1 patch Transdermal Q24H   metoprolol tartrate  50 mg Oral BID   midodrine  10 mg Oral BID   multivitamin  1 tablet Oral QHS   mupirocin ointment   Topical TID   nystatin   Topical BID   pantoprazole  40 mg Oral BID   sodium chloride flush  3 mL Intravenous Q12H   Continuous Infusions:  sodium chloride            Aline August, MD [facility] 07/19/2021, 7:38 AM

## 2021-07-19 NOTE — Progress Notes (Signed)
Nutrition Follow-up  DOCUMENTATION CODES:   Not applicable  INTERVENTION:   Recommend initiation of TF within 24-48 hours if unable to extubate  Tube Feeding via OG:  Vital 1.5 at 50 ml/hr Pro-Source TF 45 mL TID Provides 114 g of protein, 1920 kcals and 912 mL of free water  Continue Renal MVI   NUTRITION DIAGNOSIS:   Increased nutrient needs related to chronic illness (ESRD on HD) as evidenced by estimated needs.  Being addressed via TF   GOAL:   Patient will meet greater than or equal to 90% of their needs   MONITOR:   PO intake, Supplement acceptance, Weight trends, Labs, I & O's  REASON FOR ASSESSMENT:   Ventilator    ASSESSMENT:   60 yo male admitted with possible edema secondary to missed dialysis, had brief arrest in preop for AVF creation and required intubation. PMH includes CHF, chronic respiratory failure on home oxygen, CHF, ESRD on HD, CVA   11/15 Admitted 11/18 Brief cardiac arrest, planned AVF creation-now plan for outpatient  Pt sedated on vent, Noted plan for CRRT  OG tube in appropriate position per chest xray  Unsure of EDW  Labs: CBGs 60-105 Meds: Renal MVI, miralax, ss novolog   Diet Order:   Diet Order             Diet NPO time specified Except for: Sips with Meds  Diet effective midnight                   EDUCATION NEEDS:   No education needs have been identified at this time  Skin:  Skin Assessment: Skin Integrity Issues: Skin Integrity Issues:: Stage II Stage II: coccyx  Last BM:  11/17  Height:   Ht Readings from Last 1 Encounters:  07/17/21 5\' 8"  (1.727 m)    Weight:   Wt Readings from Last 1 Encounters:  07/18/21 61 kg     BMI:  Body mass index is 20.45 kg/m.  Estimated Nutritional Needs:   Kcal:  1800-2000  Protein:  90-100 grams  Fluid:  1L+UOP   Kerman Passey MS, RDN, LDN, CNSC Registered Dietitian III Clinical Nutrition RD Pager and On-Call Pager Number Located in [place]

## 2021-07-19 NOTE — Progress Notes (Signed)
[facility] Progress Note  Subjective: seen in ICU. Pt went for access surgery but had cardiac arrest after the preop block.   Vitals:   07/19/21 1345 07/19/21 1400 07/19/21 1445 07/19/21 1455  BP:  114/78    Pulse:      Resp: (!) 24 (!) 24    Temp: (!) 97.3 F (36.3 C) (!) 97.3 F (36.3 C)    TempSrc:      SpO2:   91% 94%  Weight:      Height:        Exam:  alert, nad   no jvd  Chest cta bilat  Cor reg no RG  Abd soft ntnd no ascites  Ext 1-2+ diffuse bilat LE edema from feet to hips Neuro is alert, Ox 3 , nf, gen deconditioning   RUE AVF (maturing), +bruit     Home meds include - eliquis, breo ellipta, sensipar, lasix 80 non hd days, metoprolol 50 bid, midodrine 10 bid, rena vit, renvela 1-2 ac,         OP HD: from 07/02/21 > MWF AF  4h  400/800    2/2 bath  Heparin 2000  RUE AVF (maturing)    - 1st stage R basilic vein AVF creation done by Dr Trula Slade on 04/04/21    CXR 11/18 - IMPRESSION: New left IJ central line tip overlies SVC.  No pneumothorax. Persistent patchy bilateral opacities and layering right pleural effusion.   Assessment/ Plan: ESRD - on HD MWF.  AVF placed in August, needs additional surgery, was going to have today but pt arrested prior to the surgery. Cont to use TDC placed by IR on 11/16.  Pt in ICU now and will need CRRT. See orders.  Hypotension - on midodrine pre hd tiw Volume - vol excess, diffuse LE edema COPD MBD ckd - cont binders Anemia ckd - Hb 9.4    Thomas Robinson 07/19/2021, 3:31 PM   Recent Labs  Lab 07/18/21 0348 07/19/21 0338 07/19/21 1005 07/19/21 1100 07/19/21 1135  K 4.3 4.9 5.3*  --  4.6  BUN 39* 49*  --   --   --   CREATININE 7.38* 8.33*  --   --   --   CALCIUM 8.8* 8.1*  --   --   --   HGB 9.8* 10.3* 11.9* 12.8* 12.6*    Inpatient medications:  (feeding supplement) PROSource Plus  30 mL Oral BID BM   acetaminophen  650 mg Per Tube Q6H   Chlorhexidine Gluconate Cloth  6 each Topical Q0600    docusate  100 mg Per Tube BID   feeding supplement (NEPRO CARB STEADY)  237 mL Per Tube BID BM   insulin aspart  0-9 Units Subcutaneous Q4H   ipratropium-albuterol  3 mL Nebulization QID   midodrine  10 mg Per Tube TID   multivitamin  1 tablet Per Tube QHS   mupirocin ointment   Topical TID   nystatin   Topical BID   pantoprazole sodium  40 mg Per Tube Daily   polyethylene glycol  17 g Per Tube Daily   sodium chloride flush  3 mL Intravenous Q12H    sodium chloride     cefTRIAXone (ROCEPHIN)  IV 20 mL/hr at 07/19/21 1401   norepinephrine (LEVOPHED) Adult infusion 22 mcg/min (07/19/21 1526)   propofol (DIPRIVAN) infusion 50 mcg/kg/min (07/19/21 1401)   sodium chloride, fentaNYL (SUBLIMAZE) injection, fentaNYL (SUBLIMAZE) injection, fluticasone furoate-vilanterol, guaiFENesin-dextromethorphan, oxyCODONE, sodium chloride flush
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
## 2021-07-19 NOTE — Progress Notes (Signed)
Nerve block completed at this time. Blood pressure cuff on leg d/t restricted right arm and IV on left. Unable to obtain blood pressure x 3 attempts. Blood pressure cuff moved to right arm. Blood pressure 61/45, second attempt 50/38. Pt lethargic, he responded after sternal rub. Dr. Lissa Hoard called back to room. Pts pupils constricted bilaterally. Pt stopped breathing at this time. Carotid pulse week. CRNA present, anesthesia staff called and took over pt.

## 2021-07-19 NOTE — Progress Notes (Addendum)
Spoke to mPOA cousin Zebedee Iba [phone]), former longtime [facility] nurse regarding patient status. Patient physical and mental wellbeing have deteriorated significantly over past several months. We both agreed that patient should be DNR going forward.  Should he make a recovery this can be re-evaluated.  Erskine Emery MD PCCM

## 2021-07-19 NOTE — Anesthesia Procedure Notes (Signed)
Procedure Name: Intubation Date/Time: 07/19/2021 10:19 AM Performed by: Amadeo Garnet, CRNA Pre-anesthesia Checklist: Patient identified, Emergency Drugs available, Suction available and Patient being monitored Patient Re-evaluated:Patient Re-evaluated prior to induction Oxygen Delivery Method: Circle system utilized Preoxygenation: Pre-oxygenation with 100% oxygen Tube type: Oral Number of attempts: 1 Airway Equipment and Method: Stylet and Oral airway Placement Confirmation: ETT inserted through vocal cords under direct vision, positive ETCO2 and breath sounds checked- equal and bilateral Secured at: 22 cm Tube secured with: Tape Dental Injury: Teeth and Oropharynx as per pre-operative assessment

## 2021-07-19 NOTE — Progress Notes (Signed)
Echocardiogram 2D Echocardiogram has been performed.  Oneal Deputy Adalea Handler RDCS 07/19/2021, 1:40 PM

## 2021-07-19 NOTE — Anesthesia Procedure Notes (Signed)
Arterial Line Insertion Start/End11/18/2022 10:15 AM, 07/19/2021 10:21 AM Performed by: Carolan Clines, CRNA, CRNA  Patient location: Pre-op. Preanesthetic checklist: patient identified, IV checked, site marked, risks and benefits discussed, surgical consent, monitors and equipment checked, pre-op evaluation, timeout performed and anesthesia consent Left, radial was placed Catheter size: 20 G  Attempts: 1 Procedure performed without using ultrasound guided technique. Following insertion, dressing applied and Biopatch. Patient tolerated the procedure well with no immediate complications.

## 2021-07-20 ENCOUNTER — Inpatient Hospital Stay (HOSPITAL_COMMUNITY): Payer: Medicare Other

## 2021-07-20 DIAGNOSIS — I469 Cardiac arrest, cause unspecified: Secondary | ICD-10-CM | POA: Diagnosis not present

## 2021-07-20 DIAGNOSIS — N186 End stage renal disease: Secondary | ICD-10-CM | POA: Diagnosis not present

## 2021-07-20 DIAGNOSIS — Z992 Dependence on renal dialysis: Secondary | ICD-10-CM | POA: Diagnosis not present

## 2021-07-20 LAB — POCT I-STAT 7, (LYTES, BLD GAS, ICA,H+H)
Acid-Base Excess: 1 mmol/L (ref 0.0–2.0)
Acid-base deficit: 1 mmol/L (ref 0.0–2.0)
Bicarbonate: 23.2 mmol/L (ref 20.0–28.0)
Bicarbonate: 24 mmol/L (ref 20.0–28.0)
Calcium, Ion: 0.98 mmol/L — ABNORMAL LOW (ref 1.15–1.40)
Calcium, Ion: 1.08 mmol/L — ABNORMAL LOW (ref 1.15–1.40)
HCT: 31 % — ABNORMAL LOW (ref 39.0–52.0)
HCT: 32 % — ABNORMAL LOW (ref 39.0–52.0)
Hemoglobin: 10.5 g/dL — ABNORMAL LOW (ref 13.0–17.0)
Hemoglobin: 10.9 g/dL — ABNORMAL LOW (ref 13.0–17.0)
O2 Saturation: 100 %
O2 Saturation: 98 %
Patient temperature: 35.7
Patient temperature: 97
Potassium: 4 mmol/L (ref 3.5–5.1)
Potassium: 4.1 mmol/L (ref 3.5–5.1)
Sodium: 136 mmol/L (ref 135–145)
Sodium: 136 mmol/L (ref 135–145)
TCO2: 24 mmol/L (ref 22–32)
TCO2: 25 mmol/L (ref 22–32)
pCO2 arterial: 27.9 mmHg — ABNORMAL LOW (ref 32.0–48.0)
pCO2 arterial: 37.6 mmHg (ref 32.0–48.0)
pH, Arterial: 7.41 (ref 7.350–7.450)
pH, Arterial: 7.523 — ABNORMAL HIGH (ref 7.350–7.450)
pO2, Arterial: 100 mmHg (ref 83.0–108.0)
pO2, Arterial: 173 mmHg — ABNORMAL HIGH (ref 83.0–108.0)

## 2021-07-20 LAB — RENAL FUNCTION PANEL
Albumin: 2 g/dL — ABNORMAL LOW (ref 3.5–5.0)
Albumin: 2 g/dL — ABNORMAL LOW (ref 3.5–5.0)
Anion gap: 11 (ref 5–15)
Anion gap: 12 (ref 5–15)
BUN: 28 mg/dL — ABNORMAL HIGH (ref 8–23)
BUN: 29 mg/dL — ABNORMAL HIGH (ref 8–23)
CO2: 22 mmol/L (ref 22–32)
CO2: 22 mmol/L (ref 22–32)
Calcium: 7.6 mg/dL — ABNORMAL LOW (ref 8.9–10.3)
Calcium: 7.7 mg/dL — ABNORMAL LOW (ref 8.9–10.3)
Chloride: 100 mmol/L (ref 98–111)
Chloride: 101 mmol/L (ref 98–111)
Creatinine, Ser: 4.68 mg/dL — ABNORMAL HIGH (ref 0.61–1.24)
Creatinine, Ser: 4.77 mg/dL — ABNORMAL HIGH (ref 0.61–1.24)
GFR, Estimated: 13 mL/min — ABNORMAL LOW (ref >60)
GFR, Estimated: 13 mL/min — ABNORMAL LOW (ref >60)
Glucose, Bld: 118 mg/dL — ABNORMAL HIGH (ref 70–99)
Glucose, Bld: 119 mg/dL — ABNORMAL HIGH (ref 70–99)
Phosphorus: 3.5 mg/dL (ref 2.5–4.6)
Phosphorus: 3.5 mg/dL (ref 2.5–4.6)
Potassium: 4 mmol/L (ref 3.5–5.1)
Potassium: 4 mmol/L (ref 3.5–5.1)
Sodium: 134 mmol/L — ABNORMAL LOW (ref 135–145)
Sodium: 134 mmol/L — ABNORMAL LOW (ref 135–145)

## 2021-07-20 LAB — TRIGLYCERIDES: Triglycerides: 72 mg/dL (ref <150)

## 2021-07-20 LAB — HEPATIC FUNCTION PANEL
ALT: 14 U/L (ref 0–44)
AST: 61 U/L — ABNORMAL HIGH (ref 15–41)
Albumin: 2 g/dL — ABNORMAL LOW (ref 3.5–5.0)
Alkaline Phosphatase: 119 U/L (ref 38–126)
Bilirubin, Direct: 0.4 mg/dL — ABNORMAL HIGH (ref 0.0–0.2)
Indirect Bilirubin: 0.6 mg/dL (ref 0.3–0.9)
Total Bilirubin: 1 mg/dL (ref 0.3–1.2)
Total Protein: 4.9 g/dL — ABNORMAL LOW (ref 6.5–8.1)

## 2021-07-20 LAB — BASIC METABOLIC PANEL
Anion gap: 9 (ref 5–15)
BUN: 28 mg/dL — ABNORMAL HIGH (ref 8–23)
CO2: 23 mmol/L (ref 22–32)
Calcium: 7.7 mg/dL — ABNORMAL LOW (ref 8.9–10.3)
Chloride: 102 mmol/L (ref 98–111)
Creatinine, Ser: 4.77 mg/dL — ABNORMAL HIGH (ref 0.61–1.24)
GFR, Estimated: 13 mL/min — ABNORMAL LOW (ref >60)
Glucose, Bld: 117 mg/dL — ABNORMAL HIGH (ref 70–99)
Potassium: 4 mmol/L (ref 3.5–5.1)
Sodium: 134 mmol/L — ABNORMAL LOW (ref 135–145)

## 2021-07-20 LAB — GLUCOSE, CAPILLARY
Glucose-Capillary: 111 mg/dL — ABNORMAL HIGH (ref 70–99)
Glucose-Capillary: 111 mg/dL — ABNORMAL HIGH (ref 70–99)
Glucose-Capillary: 115 mg/dL — ABNORMAL HIGH (ref 70–99)
Glucose-Capillary: 118 mg/dL — ABNORMAL HIGH (ref 70–99)
Glucose-Capillary: 119 mg/dL — ABNORMAL HIGH (ref 70–99)
Glucose-Capillary: 89 mg/dL (ref 70–99)

## 2021-07-20 LAB — APTT: aPTT: 43 seconds — ABNORMAL HIGH (ref 24–36)

## 2021-07-20 LAB — MAGNESIUM: Magnesium: 2 mg/dL (ref 1.7–2.4)

## 2021-07-20 MED ORDER — GERHARDT'S BUTT CREAM
TOPICAL_CREAM | Freq: Every day | CUTANEOUS | Status: DC
  Administered 2021-07-26: 1 via TOPICAL
  Filled 2021-07-20 (×2): qty 1

## 2021-07-20 MED ORDER — DEXTROSE 10 % IV SOLN: INTRAVENOUS | Status: DC

## 2021-07-20 MED ORDER — ACETAMINOPHEN 160 MG/5ML PO SOLN
650.0000 mg | Freq: Four times a day (QID) | ORAL | Status: DC
  Administered 2021-07-20 – 2021-07-21 (×3): 650 mg
  Filled 2021-07-20 (×3): qty 20.3

## 2021-07-20 MED ORDER — ORAL CARE MOUTH RINSE
15.0000 mL | OROMUCOSAL | Status: DC
  Administered 2021-07-20 – 2021-07-21 (×11): 15 mL via OROMUCOSAL

## 2021-07-20 MED ORDER — CHLORHEXIDINE GLUCONATE 0.12% ORAL RINSE (MEDLINE KIT)
15.0000 mL | Freq: Two times a day (BID) | OROMUCOSAL | Status: DC
  Administered 2021-07-20 – 2021-07-21 (×3): 15 mL via OROMUCOSAL
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
## 2021-07-20 NOTE — Plan of Care (Signed)
  Problem: Respiratory: Goal: Ability to maintain a clear airway and adequate ventilation will improve Outcome: Progressing   Problem: Fluid Volume: Goal: Compliance with measures to maintain balanced fluid volume will improve Outcome: Progressing

## 2021-07-20 NOTE — Progress Notes (Signed)
[facility] Progress Note  Subjective: seen in ICU. I/O +340 yesterday. Labs ok today on CRRT. Levo gtt down to 9 ug/min.   Vitals:   07/20/21 0645 07/20/21 0700 07/20/21 0903 07/20/21 1145  BP:      Pulse: (!) 112 (!) 113    Resp: 18 18    Temp: (!) 96.1 F (35.6 C) (!) 95.9 F (35.5 C)    TempSrc:      SpO2: 100% 100% 100% 100%  Weight:      Height:        Exam:  alert, nad   no jvd  Chest cta bilat  Cor reg no RG  Abd soft ntnd no ascites  Ext 1-2+ diffuse bilat LE edema from feet to hips Neuro is alert, Ox 3 , nf, gen deconditioning   RUE AVF (maturing), +bruit     Home meds include - eliquis, breo ellipta, sensipar, lasix 80 non hd days, metoprolol 50 bid, midodrine 10 bid, rena vit, renvela 1-2 ac,         OP HD: from 07/02/21 > MWF AF  4h  400/800    2/2 bath  Heparin 2000  RUE AVF (maturing)    - 1st stage R basilic vein AVF creation done by Dr Trula Slade on 04/04/21    CXR 11/18 - IMPRESSION: New left IJ central line tip overlies SVC.  No pneumothorax. Persistent patchy bilateral opacities and layering right pleural effusion.   Assessment/ Plan: SP cardiac arrest - on 11/18.  VDRF - wean per CCM ESRD - usually on HD MWF.  Started CRRT 11/18 d/t hypotension post arrest. When off pressors when can probably stop.  BP/volume - vol excess/ LE edema, plan small UF today as tolerated. Getting po midodrine 10 tid + levo gtt.   HD access - AVF placed in August, was going to have 2nd stage AVF surgery but had cardiac arrest in preop. Cont to use TDC (placed here by IR 11/16) MBD ckd - resume binders when taking po Anemia ckd - Hb 10's, follow    Thomas Robinson 07/20/2021, 12:44 PM   Recent Labs  Lab 07/19/21 0338 07/19/21 1005 07/19/21 2056 07/19/21 2346 07/20/21 0425  K 4.9   < > 3.7 4.0 4.0  4.0  4.0  BUN 49*  --   --   --  28*  29*  28*  CREATININE 8.33*  --   --   --  4.77*  4.68*  4.77*  CALCIUM 8.1*  --   --   --  7.7*  7.7*  7.6*   PHOS  --   --   --   --  3.5  3.5  HGB 10.3*   < > 10.2* 10.5*  --    < > = values in this interval not displayed.    Inpatient medications:  (feeding supplement) PROSource Plus  30 mL Oral BID BM   acetaminophen  650 mg Per Tube Q6H   chlorhexidine gluconate (MEDLINE KIT)  15 mL Mouth Rinse BID   Chlorhexidine Gluconate Cloth  6 each Topical Q0600   docusate  100 mg Per Tube BID   insulin aspart  0-9 Units Subcutaneous Q4H   ipratropium-albuterol  3 mL Nebulization QID   mouth rinse  15 mL Mouth Rinse 10 times per day   midodrine  10 mg Per Tube TID   multivitamin  1 tablet Per Tube QHS   mupirocin ointment   Topical TID   nystatin  Topical BID   pantoprazole sodium  40 mg Per Tube Daily   polyethylene glycol  17 g Per Tube Daily   sodium chloride flush  3 mL Intravenous Q12H     prismasol BGK 4/2.5 400 mL/hr at 07/20/21 0459    prismasol BGK 4/2.5 200 mL/hr at 07/19/21 1626   sodium chloride     cefTRIAXone (ROCEPHIN)  IV Stopped (07/20/21 0201)   dextrose 50 mL/hr at 07/20/21 0900   heparin 10,000 units/ 20 mL infusion syringe 350 Units/hr (07/19/21 1627)   norepinephrine (LEVOPHED) Adult infusion 9 mcg/min (07/20/21 1101)   prismasol BGK 4/2.5 1,750 mL/hr at 07/20/21 0421   propofol (DIPRIVAN) infusion Stopped (07/20/21 0847)   sodium chloride, alteplase, fentaNYL (SUBLIMAZE) injection, fentaNYL (SUBLIMAZE) injection, fluticasone furoate-vilanterol, guaiFENesin-dextromethorphan, heparin, heparin, oxyCODONE, sodium chloride, sodium chloride flush

## 2021-07-20 NOTE — Plan of Care (Signed)
CHANGE IN DIALYSIS SCHEDULE DUE TO HOLIDAY  Thomas Robinson [birth year] [id number]  Patient dialysis schedule for the week of 07/21/21-07/28/21 varies from their normal schedule due to Thanksgiving Holiday.  Unfortunately due to unforeseen circumstances schedule while admitted to [facility] will be different from outpatient dialysis schedule.  Need to keep in mind for discharge planning.  Both schedules are as follows:  Normal HD Schedule: Monday Wednesday Friday Inpatient HD schedule at [facility]: Sunday, Tuesday, Friday Outpatient HD schedule: Monday, Wednesday, Saturday  They will resume their normal schedule on 07/29/21.     Jen Mow, PA-C [facility] Pager: [phone]

## 2021-07-20 NOTE — Progress Notes (Signed)
Patient remains intubated and sedated after cardiac arrest during regional anesthetic in preparation for arteriovenous graft creation.  He has dialysis access via tunneled dialysis catheter.  Would recommend using this going forward until he is able to recover.  He should follow-up with [place] in 1 to 2 months for outpatient evaluation for consideration of venous graft.  Please call for questions.  Yevonne Aline. Stanford Breed, MD [facility] Phone Number: [phone] 07/20/2021 9:22 AM

## 2021-07-20 NOTE — Progress Notes (Signed)
eLink Physician-Brief Progress Note Patient Name: Thomas Robinson DOB: 1961/03/10 MRN: [id number]   Date of Service  07/20/2021  HPI/Events of Note  RN reported hypoglycemia ~8pm in the 44s. Patient has been on d10 gtt however no orders.  eICU Interventions  D10 gtt @50cc  ordered Trend CBG        [name] 07/20/2021, 2:06 AM

## 2021-07-21 DIAGNOSIS — I469 Cardiac arrest, cause unspecified: Secondary | ICD-10-CM | POA: Diagnosis not present

## 2021-07-21 LAB — GLUCOSE, CAPILLARY
Glucose-Capillary: 102 mg/dL — ABNORMAL HIGH (ref 70–99)
Glucose-Capillary: 102 mg/dL — ABNORMAL HIGH (ref 70–99)
Glucose-Capillary: 104 mg/dL — ABNORMAL HIGH (ref 70–99)
Glucose-Capillary: 119 mg/dL — ABNORMAL HIGH (ref 70–99)
Glucose-Capillary: 123 mg/dL — ABNORMAL HIGH (ref 70–99)
Glucose-Capillary: 71 mg/dL (ref 70–99)
Glucose-Capillary: 90 mg/dL (ref 70–99)

## 2021-07-21 LAB — HEPATIC FUNCTION PANEL
ALT: 13 U/L (ref 0–44)
AST: 27 U/L (ref 15–41)
Albumin: 1.9 g/dL — ABNORMAL LOW (ref 3.5–5.0)
Alkaline Phosphatase: 107 U/L (ref 38–126)
Bilirubin, Direct: 0.4 mg/dL — ABNORMAL HIGH (ref 0.0–0.2)
Indirect Bilirubin: 0.4 mg/dL (ref 0.3–0.9)
Total Bilirubin: 0.8 mg/dL (ref 0.3–1.2)
Total Protein: 5 g/dL — ABNORMAL LOW (ref 6.5–8.1)

## 2021-07-21 LAB — RENAL FUNCTION PANEL
Albumin: 1.9 g/dL — ABNORMAL LOW (ref 3.5–5.0)
Anion gap: 7 (ref 5–15)
BUN: 15 mg/dL (ref 8–23)
CO2: 26 mmol/L (ref 22–32)
Calcium: 7.8 mg/dL — ABNORMAL LOW (ref 8.9–10.3)
Chloride: 101 mmol/L (ref 98–111)
Creatinine, Ser: 2.31 mg/dL — ABNORMAL HIGH (ref 0.61–1.24)
GFR, Estimated: 31 mL/min — ABNORMAL LOW (ref >60)
Glucose, Bld: 105 mg/dL — ABNORMAL HIGH (ref 70–99)
Phosphorus: 2.5 mg/dL (ref 2.5–4.6)
Potassium: 4.3 mmol/L (ref 3.5–5.1)
Sodium: 134 mmol/L — ABNORMAL LOW (ref 135–145)

## 2021-07-21 LAB — APTT: aPTT: 37 seconds — ABNORMAL HIGH (ref 24–36)

## 2021-07-21 LAB — MAGNESIUM: Magnesium: 2.2 mg/dL (ref 1.7–2.4)

## 2021-07-21 MED ORDER — MIDODRINE HCL 5 MG PO TABS
10.0000 mg | ORAL_TABLET | Freq: Three times a day (TID) | ORAL | Status: DC
  Administered 2021-07-21 – 2021-07-26 (×17): 10 mg via ORAL
  Filled 2021-07-21 (×17): qty 2

## 2021-07-21 MED ORDER — PANTOPRAZOLE SODIUM 40 MG PO TBEC
40.0000 mg | DELAYED_RELEASE_TABLET | Freq: Every day | ORAL | Status: DC
  Administered 2021-07-22 – 2021-07-26 (×5): 40 mg via ORAL
  Filled 2021-07-21 (×5): qty 1

## 2021-07-21 MED ORDER — IPRATROPIUM-ALBUTEROL 0.5-2.5 (3) MG/3ML IN SOLN: 3.0000 mL | Freq: Four times a day (QID) | RESPIRATORY_TRACT | Status: DC | PRN

## 2021-07-21 MED ORDER — ACETAMINOPHEN 325 MG PO TABS
650.0000 mg | ORAL_TABLET | Freq: Four times a day (QID) | ORAL | Status: DC
  Administered 2021-07-21 – 2021-07-25 (×10): 650 mg via ORAL
  Filled 2021-07-21 (×13): qty 2

## 2021-07-21 MED ORDER — ORAL CARE MOUTH RINSE
15.0000 mL | Freq: Two times a day (BID) | OROMUCOSAL | Status: DC
  Administered 2021-07-21 – 2021-07-26 (×12): 15 mL via OROMUCOSAL

## 2021-07-21 MED ORDER — CHLORHEXIDINE GLUCONATE CLOTH 2 % EX PADS
6.0000 | MEDICATED_PAD | Freq: Every day | CUTANEOUS | Status: DC
  Administered 2021-07-22 – 2021-07-25 (×4): 6 via TOPICAL

## 2021-07-21 MED ORDER — OXYCODONE HCL 5 MG PO TABS
5.0000 mg | ORAL_TABLET | Freq: Four times a day (QID) | ORAL | Status: DC | PRN
  Administered 2021-07-21 – 2021-07-24 (×6): 5 mg via ORAL
  Filled 2021-07-21 (×6): qty 1

## 2021-07-21 MED ORDER — RENA-VITE PO TABS
1.0000 | ORAL_TABLET | Freq: Every day | ORAL | Status: DC
Start: 2021-07-21 — End: 2021-07-27
  Administered 2021-07-21 – 2021-07-26 (×6): 1 via ORAL
  Filled 2021-07-21 (×6): qty 1

## 2021-07-21 MED ORDER — NOREPINEPHRINE 4 MG/250ML-% IV SOLN: 0.0000 ug/min | INTRAVENOUS | Status: DC

## 2021-07-21 NOTE — Progress Notes (Signed)
Patient's slightly hypotensive after discontinuing norepi per Dr. Lynetta Mare. RN reported this to Dr. Lynetta Mare who instructed RN to continue to maintain patients hemodynamics without pressors if possible.   1330: Pt BP remains slightly low, reported to Dr. Lynetta Mare, midodrine reordered per MD. No additional orders at this time  1500: Pt BP sustained 90s/40s on Aline Cuff BPs 80s/50s MAPs mid 60s. Reported to Dr. Lynetta Mare by this RN. Orders to restart norepi. run even on CRRT to maintain BP per Dr. Lynetta Mare.   Alma Friendly RN

## 2021-07-21 NOTE — Progress Notes (Signed)
Pt placed on PSV 5/5 per wean protocol. Pt is tolerating well at this time. RN made aware.

## 2021-07-21 NOTE — Procedures (Signed)
Extubation Procedure Note  Patient Details:   Name: Thomas Robinson DOB: [birth year] MRN: [id number]   Airway Documentation:    Vent end date: 07/21/21 Vent end time: 1041   Evaluation  O2 sats: stable throughout Complications: No apparent complications Patient did tolerate procedure well. Bilateral Breath Sounds: Clear, Diminished   Pt extubated to 4L [place] per MD order. Pt had positive cuff leak prior to extubation. No stridor noted. Pt able to voice his name.  [name] 07/21/2021, 10:41 AM
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
## 2021-07-21 NOTE — Plan of Care (Signed)
°  Problem: Education: °Goal: Knowledge of disease and its progression will improve °Outcome: Progressing °  °Problem: Fluid Volume: °Goal: Compliance with measures to maintain balanced fluid volume will improve °Outcome: Progressing °  °Problem: Clinical Measurements: °Goal: Complications related to the disease process, condition or treatment will be avoided or minimized °Outcome: Progressing °  °

## 2021-07-21 NOTE — Progress Notes (Signed)
[facility] Progress Note  Subjective: levo gtt at 5 ug /min, I/O even yesterday. FiO2 0.4 unchanged.   Vitals:   07/21/21 0300 07/21/21 0400 07/21/21 0401 07/21/21 0500  BP:      Pulse: (!) 117 (!) 118    Resp: 18 18    Temp:  98.1 F (36.7 C) 98.1 F (36.7 C)   TempSrc:  Axillary Axillary   SpO2: 100% 100%    Weight:    65.5 kg  Height:        Exam:  alert, nad   no jvd  Chest cta bilat  Cor reg no RG  Abd soft ntnd no ascites  Ext 1-2+ diffuse bilat LE edema from feet to hips Neuro is alert, Ox 3 , nf, gen deconditioning   RUE AVF (maturing), +bruit     Home meds include - eliquis, breo ellipta, sensipar, lasix 80 non hd days, metoprolol 50 bid, midodrine 10 bid, rena vit, renvela 1-2 ac,         OP HD: from 07/02/21 > MWF AF  4h  400/800   50kg  2/2 bath  Heparin 2000  RUE AVF (maturing)    - 1st stage R basilic vein AVF creation done by Dr Trula Slade on 04/04/21    CXR 11/18 - IMPRESSION: New left IJ central line tip overlies SVC.  No pneumothorax. Persistent patchy bilateral opacities and layering right pleural effusion.   Assessment/ Plan: SP cardiac arrest - on 11/18.  VDRF - wean per CCM ESRD - usual HD is MWF.  Started CRRT 11/18 d/t hypotension post arrest.  BP/volume - w/ LE edema, on po midodrine 10 tid + low dose levo gtt. UP 10-15kg, will try UF 50- 100 cc/hr if not going up on pressors   HD access - admitted after [facility] removed last week for hospice transition. Pt changed his mind and was admitted. We tried to use his AVF but wasn't mature enough. IR placed new [place] on 11/16. The AVF 1st stage was in August. [facility] saw pt and was going to have 2nd stage AVF surgery here but had cardiac arrest prior to the procedure. Per [facility] will reassess in 1-2 mos when more stable.  MBD ckd - resume binders when taking po Anemia ckd - Hb 10's, follow    Rob Demetrie Borge 07/21/2021, 5:44 AM   Recent Labs  Lab 07/19/21 0338 07/19/21 1005 07/19/21 2346  07/20/21 0425 07/20/21 1518  K 4.9   < > 4.0 4.0  4.0  4.0 4.1  BUN 49*  --   --  28*  29*  28*  --   CREATININE 8.33*  --   --  4.77*  4.68*  4.77*  --   CALCIUM 8.1*  --   --  7.7*  7.7*  7.6*  --   PHOS  --   --   --  3.5  3.5  --   HGB 10.3*   < > 10.5*  --  10.9*   < > = values in this interval not displayed.    Inpatient medications:  (feeding supplement) PROSource Plus  30 mL Oral BID BM   acetaminophen (TYLENOL) oral liquid 160 mg/5 mL  650 mg Per Tube Q6H   chlorhexidine gluconate (MEDLINE KIT)  15 mL Mouth Rinse BID   Chlorhexidine Gluconate Cloth  6 each Topical Q0600   docusate  100 mg Per Tube BID   Gerhardt's butt cream   Topical Daily   insulin aspart  0-9 Units Subcutaneous Q4H   ipratropium-albuterol  3 mL Nebulization QID   mouth rinse  15 mL Mouth Rinse 10 times per day   midodrine  10 mg Per Tube TID   multivitamin  1 tablet Per Tube QHS   mupirocin ointment   Topical TID   nystatin   Topical BID   pantoprazole sodium  40 mg Per Tube Daily   polyethylene glycol  17 g Per Tube Daily   sodium chloride flush  3 mL Intravenous Q12H     prismasol BGK 4/2.5 400 mL/hr at 07/21/21 0043    prismasol BGK 4/2.5 200 mL/hr at 07/21/21 0338   sodium chloride     cefTRIAXone (ROCEPHIN)  IV Stopped (07/20/21 1338)   dextrose 50 mL/hr at 07/21/21 0400   heparin 10,000 units/ 20 mL infusion syringe 350 Units/hr (07/20/21 2100)   norepinephrine (LEVOPHED) Adult infusion 5 mcg/min (07/21/21 0400)   prismasol BGK 4/2.5 1,750 mL/hr at 07/20/21 2156   propofol (DIPRIVAN) infusion Stopped (07/20/21 0847)   sodium chloride, alteplase, fentaNYL (SUBLIMAZE) injection, fluticasone furoate-vilanterol, guaiFENesin-dextromethorphan, heparin, heparin, oxyCODONE, sodium chloride, sodium chloride flush

## 2021-07-22 DIAGNOSIS — I469 Cardiac arrest, cause unspecified: Secondary | ICD-10-CM | POA: Diagnosis not present

## 2021-07-22 LAB — RENAL FUNCTION PANEL
Albumin: 2 g/dL — ABNORMAL LOW (ref 3.5–5.0)
Albumin: 2 g/dL — ABNORMAL LOW (ref 3.5–5.0)
Anion gap: 6 (ref 5–15)
Anion gap: 6 (ref 5–15)
BUN: 7 mg/dL — ABNORMAL LOW (ref 8–23)
BUN: 6 mg/dL — ABNORMAL LOW (ref 8–23)
CO2: 26 mmol/L (ref 22–32)
CO2: 27 mmol/L (ref 22–32)
Calcium: 8 mg/dL — ABNORMAL LOW (ref 8.9–10.3)
Calcium: 7.6 mg/dL — ABNORMAL LOW (ref 8.9–10.3)
Chloride: 101 mmol/L (ref 98–111)
Chloride: 102 mmol/L (ref 98–111)
Creatinine, Ser: 1.64 mg/dL — ABNORMAL HIGH (ref 0.61–1.24)
Creatinine, Ser: 1.41 mg/dL — ABNORMAL HIGH (ref 0.61–1.24)
GFR, Estimated: 47 mL/min — ABNORMAL LOW (ref >60)
GFR, Estimated: 56 mL/min — ABNORMAL LOW (ref >60)
Glucose, Bld: 83 mg/dL (ref 70–99)
Glucose, Bld: 91 mg/dL (ref 70–99)
Phosphorus: 2.1 mg/dL — ABNORMAL LOW (ref 2.5–4.6)
Phosphorus: 4 mg/dL (ref 2.5–4.6)
Potassium: 4.7 mmol/L (ref 3.5–5.1)
Potassium: 4.2 mmol/L (ref 3.5–5.1)
Sodium: 133 mmol/L — ABNORMAL LOW (ref 135–145)
Sodium: 135 mmol/L (ref 135–145)

## 2021-07-22 LAB — BASIC METABOLIC PANEL
Anion gap: 8 (ref 5–15)
BUN: 6 mg/dL — ABNORMAL LOW (ref 8–23)
CO2: 26 mmol/L (ref 22–32)
Calcium: 8.2 mg/dL — ABNORMAL LOW (ref 8.9–10.3)
Chloride: 101 mmol/L (ref 98–111)
Creatinine, Ser: 1.61 mg/dL — ABNORMAL HIGH (ref 0.61–1.24)
GFR, Estimated: 48 mL/min — ABNORMAL LOW (ref >60)
Glucose, Bld: 84 mg/dL (ref 70–99)
Potassium: 4.7 mmol/L (ref 3.5–5.1)
Sodium: 135 mmol/L (ref 135–145)

## 2021-07-22 LAB — CBC
HCT: 29.1 % — ABNORMAL LOW (ref 39.0–52.0)
Hemoglobin: 9 g/dL — ABNORMAL LOW (ref 13.0–17.0)
MCH: 33.1 pg (ref 26.0–34.0)
MCHC: 30.9 g/dL (ref 30.0–36.0)
MCV: 107 fL — ABNORMAL HIGH (ref 80.0–100.0)
Platelets: 69 10*3/uL — ABNORMAL LOW (ref 150–400)
RBC: 2.72 MIL/uL — ABNORMAL LOW (ref 4.22–5.81)
RDW: 20.3 % — ABNORMAL HIGH (ref 11.5–15.5)
WBC: 6.5 10*3/uL (ref 4.0–10.5)
nRBC: 0 % (ref 0.0–0.2)

## 2021-07-22 LAB — GLUCOSE, CAPILLARY
Glucose-Capillary: 82 mg/dL (ref 70–99)
Glucose-Capillary: 92 mg/dL (ref 70–99)
Glucose-Capillary: 127 mg/dL — ABNORMAL HIGH (ref 70–99)
Glucose-Capillary: 86 mg/dL (ref 70–99)
Glucose-Capillary: 92 mg/dL (ref 70–99)

## 2021-07-22 MED ORDER — SODIUM PHOSPHATES 45 MMOLE/15ML IV SOLN
30.0000 mmol | Freq: Once | INTRAVENOUS | Status: AC
  Administered 2021-07-22: 30 mmol via INTRAVENOUS
  Filled 2021-07-22: qty 10

## 2021-07-22 MED ORDER — ENSURE ENLIVE PO LIQD
237.0000 mL | Freq: Two times a day (BID) | ORAL | Status: DC
Start: 2021-07-22 — End: 2021-07-27
  Administered 2021-07-24 – 2021-07-26 (×4): 237 mL via ORAL

## 2021-07-22 MED ORDER — DEXTROSE 10 % IV SOLN: INTRAVENOUS | Status: DC

## 2021-07-22 MED ORDER — HEPARIN (PORCINE) IN NACL 2-0.9 UNITS/ML: INTRAMUSCULAR | Status: AC

## 2021-07-22 MED ORDER — HEPARIN (PORCINE) IN NACL 2-0.9 UNITS/ML: INTRAMUSCULAR | Status: DC

## 2021-07-22 NOTE — Progress Notes (Signed)
[facility] Progress Note  Subjective: levo off-  very weak-  aline pressures are not terrible -  negative 1300 by CRRT.  He is eating breakfast- " happy to be alive"    Vitals:   07/22/21 0400 07/22/21 0500 07/22/21 0600 07/22/21 0700  BP: 95/63 (!) 87/63 (!) 93/59 100/62  Pulse: (!) 117 (!) 118 (!) 118 (!) 118  Resp: (!) 23 14 18 19   Temp:      TempSrc:      SpO2: 100% 100% 100% 99%  Weight:  61.1 kg    Height:        Exam:  alert, very weak-  coughing while eating  no jvd  Chest cta bilat  Cor reg no RG  Abd soft ntnd no ascites  Ext 1-2+ diffuse bilat LE edema from feet to hips Neuro is alert, Ox 3 , nf, gen deconditioning   RUE AVF (maturing), +bruit     Home meds include - eliquis, breo ellipta, sensipar, lasix 80 non hd days, metoprolol 50 bid, midodrine 10 bid, rena vit, renvela 1-2 ac,         OP HD: from 07/02/21 > MWF AF  4h  400/800   50kg  2/2 bath  Heparin 2000  RUE AVF (maturing)    - 1st stage R basilic vein AVF creation done by Dr Trula Slade on 04/04/21    CXR 11/18 - IMPRESSION: New left IJ central line tip overlies SVC.  No pneumothorax. Persistent patchy bilateral opacities and layering right pleural effusion.   Assessment/ Plan: SP cardiac arrest - on 11/18.  VDRF - wean per CCM-  now extubated ESRD - usual HD is MWF.  Started CRRT 11/18 d/t hypotension post arrest. I want to keep him on CRRT for another 24 hours to see if can get more volume off.  I am not entirely sure he will tolerate intermittent HD and I have told him this  BP/volume - w/ LE edema, on max po midodrine - off pressors. will try UF 100-150 cc/hr  HD access - admitted after [facility] removed last week for hospice transition. Pt changed his mind and was admitted. We tried to use his AVF but wasn't mature enough. IR placed new [place] on 11/16. The AVF 1st stage was in August. VVS saw pt and was going to have 2nd stage AVF surgery here but had cardiac arrest prior to the procedure. Per  VVS will reassess in 1-2 mos when more stable.  MBD ckd - phos is fine right now- least of his issues-  actually will give some repletion today  Anemia ckd - Hb 10's, now has dropped to 9 for first time today -  will trend    Louis Meckel  07/22/2021, 7:43 AM   Recent Labs  Lab 07/20/21 1518 07/21/21 0400 07/22/21 0353 07/22/21 0454  K 4.1 4.3 4.7 4.7  BUN  --  15 6* 7*  CREATININE  --  2.31* 1.61* 1.64*  CALCIUM  --  7.8* 8.2* 8.0*  PHOS  --  2.5  --  2.1*  HGB 10.9*  --  9.0*  --    Inpatient medications:  (feeding supplement) PROSource Plus  30 mL Oral BID BM   acetaminophen  650 mg Oral Q6H   Chlorhexidine Gluconate Cloth  6 each Topical Daily   Gerhardt's butt cream   Topical Daily   insulin aspart  0-9 Units Subcutaneous Q4H   mouth rinse  15 mL Mouth Rinse BID  midodrine  10 mg Oral TID   multivitamin  1 tablet Oral QHS   mupirocin ointment   Topical TID   nystatin   Topical BID   pantoprazole  40 mg Oral Daily   sodium chloride flush  3 mL Intravenous Q12H     prismasol BGK 4/2.5 400 mL/hr at 07/21/21 1950    prismasol BGK 4/2.5 200 mL/hr at 07/21/21 1954   sodium chloride     dextrose 40 mL/hr at 07/22/21 0700   heparin 10,000 units/ 20 mL infusion syringe 350 Units/hr (07/21/21 2334)   norepinephrine (LEVOPHED) Adult infusion Stopped (07/21/21 2118)   prismasol BGK 4/2.5 1,750 mL/hr at 07/22/21 0519   sodium chloride, alteplase, fluticasone furoate-vilanterol, guaiFENesin-dextromethorphan, heparin, heparin, ipratropium-albuterol, oxyCODONE, sodium chloride, sodium chloride flush

## 2021-07-22 NOTE — Progress Notes (Signed)
Patient ID: [name], male   DOB: 1961/07/20, 60 y.o.   MRN: [id number]    Progress Note from the Palliative Medicine Team at [facility]   Patient Name: Thomas Robinson        Date: 07/22/2021 DOB: 1960-09-15  Age: 60 y.o. MRN#: [id number] Attending Physician: Kipp Brood, MD Primary Care Physician: [name], [place] Admit Date: 07/16/2021   Medical records reviewed   60 year old male admitted on 07/16/2021 with a past medical history significant for chronic hypoxic respiratory failure on 2.5 L of oxygen via nasal cannula, chronic diastolic CHF, end-stage renal disease on dialysis, thoracic aortic aneurysm, atrial flutter on Eliquis, chronic anemia, secondary hyperparathyroidism, hypertension, hyperlipidemia, recent admission from 07/07/2021, patient left AMA on 07/11/2021.  At that time it was thought he was going to except hospice benefit at home.   He presented again on 07/16/2021 with chest pain.   On presentation, he had troponins of 74 and 75; chest x-ray findings suggestive of CHF with pulmonary edema and small right-sided pleural effusion.  Nephrology was consulted, he is in the process of reinstituting dialysis therapy.   Patient had brief cardiac arrest on 11/18 during  AV fistula placement.    Patient is successfully extubated, continued CRRT at bedside.  Condition is guarded, he is high risk for decompensation.  This is the sixth hospitalization in the last few months.      This NP visited patient at the bedside as a follow up for palliative medicine needs and emotional support.    Before conversation initiated I inquired with patient if he was interested in continuing conversation with this nurse practitioner for palliative medicine needs.   Last week he refused ongoing palliative services and dismissed me.   He agrees and Patent attorney for the visit.  Continued conversation had regarding his current medical situation, the important decisions he  faces regarding his plan of care, and anticipatory care needs specific to his ability to tolerate dialysis treatment.  Discussed with patient the importance of continued conversation with his support people Thomas Robinson and Thomas Robinson/Cousin/ # [phone] and the medical providers regarding overall plan of care and treatment options,  ensuring decisions are within the context of the patients values and GOCs.  Meeting is scheduled for tomorrow at 1000 am   Questions and concerns addressed   Discussed with bedside RN  Total time spent on the unit was 35 minutes  Greater than 50% of the time was spent in counseling and coordination of care  [name] NP  Palliative Medicine Team Team Phone # [phone] Pager [phone]
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
## 2021-07-22 NOTE — Progress Notes (Signed)
[facility] Progress Note Patient Name: Thomas Robinson DOB: Oct 21, 1960 MRN: [id number]   Date of Service  07/22/2021  HPI/Events of Note  Hypoglycemia - Patient barely eating and last blood glucose = 82.   eICU Interventions  Plan: D10W IV infusion at 40 mL/hour.     Intervention Category Major Interventions: Other:  Lysle Dingwall 07/22/2021, 5:23 AM

## 2021-07-22 NOTE — Progress Notes (Signed)
Initial Nutrition Assessment  DOCUMENTATION CODES:   Not applicable  INTERVENTION:   Ensure Enlive po BID, each supplement provides 350 kcal and 20 grams of protein  Continue Pro-Source 30 mL BID  Continue Renal MVI   NUTRITION DIAGNOSIS:   Increased nutrient needs related to chronic illness (ESRD on HD) as evidenced by estimated needs.  Being addressed via diet advancement, supplements  GOAL:   Patient will meet greater than or equal to 90% of their needs  Progressing  MONITOR:   PO intake, Supplement acceptance, Weight trends, Labs, I & O's  REASON FOR ASSESSMENT:   Ventilator    ASSESSMENT:   60 yo male admitted with possible edema secondary to missed dialysis, had brief arrest in preop for AVF creation and required intubation. PMH includes CHF, chronic respiratory failure on home oxygen, CHF, ESRD on HD, CVA  11/15 Admitted 11/18 Brief cardiac arrest, planned AVF creation-now plan for outpatient 11/20 Extubated  Remains on CRRT, very weak Pt ate 50% at breakfast this AM  Current wt 61 kg; EDW 50 kg, attempting UF of 100-150 ml/hr today  Labs: phosphorus 2.1(L) Meds: Rena-Vite, Sodium phosphate, ss novolog   Diet Order:   Diet Order             Diet Heart Room service appropriate? Yes; Fluid consistency: Thin  Diet effective now                   EDUCATION NEEDS:   No education needs have been identified at this time  Skin:  Skin Assessment: Skin Integrity Issues: Skin Integrity Issues:: Stage II Stage II: coccyx  Last BM:  07/2149 k  Height:   Ht Readings from Last 1 Encounters:  07/17/21 5\' 8"  (1.727 m)    Weight:   Wt Readings from Last 1 Encounters:  07/22/21 61.1 kg    BMI:  Body mass index is 20.48 kg/m.  Estimated Nutritional Needs:   Kcal:  1800-2000  Protein:  100-125 g  Fluid:  1L+UOP    Kerman Passey MS, RDN, LDN, CNSC Registered Dietitian III Clinical Nutrition RD Pager and On-Call Pager Number  Located in [place]

## 2021-07-23 DIAGNOSIS — R531 Weakness: Secondary | ICD-10-CM | POA: Diagnosis not present

## 2021-07-23 DIAGNOSIS — I469 Cardiac arrest, cause unspecified: Secondary | ICD-10-CM | POA: Diagnosis not present

## 2021-07-23 DIAGNOSIS — I4892 Unspecified atrial flutter: Secondary | ICD-10-CM | POA: Diagnosis not present

## 2021-07-23 DIAGNOSIS — Z515 Encounter for palliative care: Secondary | ICD-10-CM | POA: Diagnosis not present

## 2021-07-23 DIAGNOSIS — N186 End stage renal disease: Secondary | ICD-10-CM | POA: Diagnosis not present

## 2021-07-23 DIAGNOSIS — Z992 Dependence on renal dialysis: Secondary | ICD-10-CM

## 2021-07-23 LAB — CBC WITH DIFFERENTIAL/PLATELET
Abs Immature Granulocytes: 0.01 10*3/uL (ref 0.00–0.07)
Basophils Absolute: 0.1 10*3/uL (ref 0.0–0.1)
Basophils Relative: 1 %
Eosinophils Absolute: 0.1 10*3/uL (ref 0.0–0.5)
Eosinophils Relative: 3 %
HCT: 27.9 % — ABNORMAL LOW (ref 39.0–52.0)
Hemoglobin: 8.7 g/dL — ABNORMAL LOW (ref 13.0–17.0)
Immature Granulocytes: 0 %
Lymphocytes Relative: 14 %
Lymphs Abs: 0.7 10*3/uL (ref 0.7–4.0)
MCH: 33.1 pg (ref 26.0–34.0)
MCHC: 31.2 g/dL (ref 30.0–36.0)
MCV: 106.1 fL — ABNORMAL HIGH (ref 80.0–100.0)
Monocytes Absolute: 0.3 10*3/uL (ref 0.1–1.0)
Monocytes Relative: 5 %
Neutro Abs: 3.6 10*3/uL (ref 1.7–7.7)
Neutrophils Relative %: 77 %
Platelets: 80 10*3/uL — ABNORMAL LOW (ref 150–400)
RBC: 2.63 MIL/uL — ABNORMAL LOW (ref 4.22–5.81)
RDW: 20.4 % — ABNORMAL HIGH (ref 11.5–15.5)
WBC: 4.7 10*3/uL (ref 4.0–10.5)
nRBC: 0 % (ref 0.0–0.2)

## 2021-07-23 LAB — BASIC METABOLIC PANEL
Anion gap: 6 (ref 5–15)
BUN: 7 mg/dL — ABNORMAL LOW (ref 8–23)
CO2: 28 mmol/L (ref 22–32)
Calcium: 8.1 mg/dL — ABNORMAL LOW (ref 8.9–10.3)
Chloride: 103 mmol/L (ref 98–111)
Creatinine, Ser: 1.36 mg/dL — ABNORMAL HIGH (ref 0.61–1.24)
GFR, Estimated: 59 mL/min — ABNORMAL LOW (ref >60)
Glucose, Bld: 94 mg/dL (ref 70–99)
Potassium: 4.1 mmol/L (ref 3.5–5.1)
Sodium: 137 mmol/L (ref 135–145)

## 2021-07-23 LAB — RENAL FUNCTION PANEL
Albumin: 2.2 g/dL — ABNORMAL LOW (ref 3.5–5.0)
Anion gap: 5 (ref 5–15)
BUN: 10 mg/dL (ref 8–23)
CO2: 27 mmol/L (ref 22–32)
Calcium: 8.5 mg/dL — ABNORMAL LOW (ref 8.9–10.3)
Chloride: 104 mmol/L (ref 98–111)
Creatinine, Ser: 1.73 mg/dL — ABNORMAL HIGH (ref 0.61–1.24)
GFR, Estimated: 44 mL/min — ABNORMAL LOW (ref >60)
Glucose, Bld: 103 mg/dL — ABNORMAL HIGH (ref 70–99)
Phosphorus: 2.7 mg/dL (ref 2.5–4.6)
Potassium: 4.5 mmol/L (ref 3.5–5.1)
Sodium: 136 mmol/L (ref 135–145)

## 2021-07-23 LAB — GLUCOSE, CAPILLARY
Glucose-Capillary: 94 mg/dL (ref 70–99)
Glucose-Capillary: 112 mg/dL — ABNORMAL HIGH (ref 70–99)
Glucose-Capillary: 97 mg/dL (ref 70–99)
Glucose-Capillary: 116 mg/dL — ABNORMAL HIGH (ref 70–99)

## 2021-07-23 MED ORDER — INSULIN ASPART 100 UNIT/ML IJ SOLN
0.0000 [IU] | Freq: Three times a day (TID) | INTRAMUSCULAR | Status: DC
Start: 2021-07-23 — End: 2021-07-26
  Administered 2021-07-24 – 2021-07-25 (×2): 1 [IU] via SUBCUTANEOUS

## 2021-07-23 MED ORDER — APIXABAN 2.5 MG PO TABS
2.5000 mg | ORAL_TABLET | Freq: Two times a day (BID) | ORAL | Status: DC
  Administered 2021-07-23 – 2021-07-26 (×8): 2.5 mg via ORAL
  Filled 2021-07-23 (×8): qty 1

## 2021-07-23 MED ORDER — CHLORHEXIDINE GLUCONATE CLOTH 2 % EX PADS
6.0000 | MEDICATED_PAD | Freq: Every day | CUTANEOUS | Status: DC
  Administered 2021-07-25: 6 via TOPICAL

## 2021-07-23 MED ORDER — DARBEPOETIN ALFA 150 MCG/0.3ML IJ SOSY
150.0000 ug | PREFILLED_SYRINGE | INTRAMUSCULAR | Status: DC
  Administered 2021-07-25: 150 ug via INTRAVENOUS
  Filled 2021-07-23 (×2): qty 0.3

## 2021-07-23 NOTE — Evaluation (Signed)
Physical Therapy Evaluation Patient Details Name: Thomas Robinson MRN: [id number] DOB: Sep 07, 1960 Today's Date: 07/23/2021  History of Present Illness  Pt adm 11/15 with chest pain and SOB likely due to missed HD. Pt had HD catheter removed week prior to transition to hospice and then changed his mind. Pt had tunneled HD catheter placed 11/16 and HD resumed. On 11/18 pt received block prior to AV graft and had cardiac arrest and intubated. CRRT initiated 11/18 and stopped 11/22. PMH includes HTN, chronic diastolic heart failure , ESRD on HD  Clinical Impression  Pt presents to PT with significant decr in mobility due to weakness and poor balance. Appears pt may not have much support at home and has repeatedly signed out AMA. Given his decline in mobility recommend SNF but I suspect ultimately he will likely refuse this.       Recommendations for follow up therapy are one component of a multi-disciplinary discharge planning process, led by the attending physician.  Recommendations may be updated based on patient status, additional functional criteria and insurance authorization.  Follow Up Recommendations Skilled nursing-short term rehab (<3 hours/day) (pt likely to refuse)    Assistance Recommended at Discharge Frequent or constant Supervision/Assistance  Functional Status Assessment Patient has had a recent decline in their functional status and demonstrates the ability to make significant improvements in function in a reasonable and predictable amount of time.  Equipment Recommendations  Wheelchair (measurements PT);Wheelchair cushion (measurements PT)    Recommendations for Other Services       Precautions / Restrictions Precautions Precautions: Fall      Mobility  Bed Mobility Overal bed mobility: Needs Assistance Bed Mobility: Supine to Sit     Supine to sit: Mod assist;HOB elevated     General bed mobility comments: Assist to elevate trunk into sitting and bring hips to  EOb    Transfers Overall transfer level: Needs assistance Equipment used: Rollator (4 wheels) Transfers: Sit to/from Stand;Bed to chair/wheelchair/BSC Sit to Stand: +2 physical assistance;Mod assist   Step pivot transfers: +2 physical assistance;Mod assist       General transfer comment: Assist to bring hips up and for balance. Pt with flexed posture and difficulty shuffling feet with pivotal steps fro bed to recliner.    Ambulation/Gait         Gait velocity: decr Gait velocity interpretation: <1.31 ft/sec, indicative of household ambulator   General Gait Details: Unable  Financial trader Rankin (Stroke Patients Only)       Balance Overall balance assessment: Needs assistance Sitting-balance support: Bilateral upper extremity supported;Feet supported Sitting balance-Leahy Scale: Poor Sitting balance - Comments: Min guard to min assist for static sitting EOB   Standing balance support: Bilateral upper extremity supported;Reliant on assistive device for balance Standing balance-Leahy Scale: Poor Standing balance comment: walker and mod assist for static standing                             Pertinent Vitals/Pain Pain Assessment: Faces Pain Score: 0-No pain    Home Living Family/patient expects to be discharged to:: Unsure                 Home Equipment: Rollator (4 wheels);Cane - single point Additional Comments: Pt reports he has been living with a friend but per prior social work notes pt's friend did not want him returning  after last admission.    Prior Function Prior Level of Function : History of Falls (last six months);Independent/Modified Independent             Mobility Comments: Using walker at times.       Hand Dominance   Dominant Hand: Right    Extremity/Trunk Assessment   Upper Extremity Assessment Upper Extremity Assessment: Defer to OT evaluation    Lower Extremity  Assessment Lower Extremity Assessment: Generalized weakness    Cervical / Trunk Assessment Cervical / Trunk Assessment: Kyphotic  Communication   Communication: No difficulties  Cognition Arousal/Alertness: Awake/alert Behavior During Therapy: WFL for tasks assessed/performed Overall Cognitive Status: No family/caregiver present to determine baseline cognitive functioning                                          General Comments General comments (skin integrity, edema, etc.): Pt with HR to 130's with activity    Exercises     Assessment/Plan    PT Assessment Patient needs continued PT services  PT Problem List Decreased strength;Decreased balance;Decreased mobility;Decreased activity tolerance       PT Treatment Interventions DME instruction;Functional mobility training;Balance training;Patient/family education;Therapeutic activities;Gait training;Therapeutic exercise    PT Goals (Current goals can be found in the Care Plan section)  Acute Rehab PT Goals Patient Stated Goal: not stated PT Goal Formulation: With patient Time For Goal Achievement: 08/06/21 Potential to Achieve Goals: Fair    Frequency Min 3X/week   Barriers to discharge Decreased caregiver support      Co-evaluation               AM-PAC PT "6 Clicks" Mobility  Outcome Measure Help needed turning from your back to your side while in a flat bed without using bedrails?: A Lot Help needed moving from lying on your back to sitting on the side of a flat bed without using bedrails?: A Lot Help needed moving to and from a bed to a chair (including a wheelchair)?: Total Help needed standing up from a chair using your arms (e.g., wheelchair or bedside chair)?: Total Help needed to walk in hospital room?: Total Help needed climbing 3-5 steps with a railing? : Total 6 Click Score: 8    End of Session Equipment Utilized During Treatment: Gait belt Activity Tolerance: Patient limited by  fatigue Patient left: in chair;with chair alarm set;with call bell/phone within reach Nurse Communication: Mobility status (nurse assisted with transfer) PT Visit Diagnosis: Muscle weakness (generalized) (M62.81);History of falling (Z91.81);Other abnormalities of gait and mobility (R26.89)    Time: 5732-2025 PT Time Calculation (min) (ACUTE ONLY): 21 min   Charges:   PT Evaluation $PT Eval Moderate Complexity: [place] Pager [phone] Office [place] 07/23/2021, 4:52 PM

## 2021-07-23 NOTE — Progress Notes (Signed)
Patient refused 0000 and 0400 CBG checks. CCM notified. CBG checked via central line with AM labs. No issues noted. Patient eating appropriately and on D10 gtt.   No further checks at this time.
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
## 2021-07-23 NOTE — Progress Notes (Signed)
[facility] Progress Note  Subjective: levo off still -    very weak still-    negative 1400 by CRRT.  A line now out and cuff pressures are low at times.  "Feeling stronger every day"    Vitals:   07/23/21 0400 07/23/21 0500 07/23/21 0600 07/23/21 0700  BP: 93/63 103/71 106/73 125/76  Pulse: (!) 123   (!) 126  Resp: 13 16 19  (!) 21  Temp: 98.2 F (36.8 C)     TempSrc: Oral     SpO2: 100%   100%  Weight:  60.6 kg    Height:        Exam:  alert, very weak-  coughing while eating  no jvd  Chest cta bilat  Cor reg no RG  Abd soft ntnd no ascites  Ext 1-2+ diffuse bilat LE edema from feet to hips Neuro is alert, Ox 3 , nf, gen deconditioning   RUE AVF (maturing), +bruit     Home meds include - eliquis, breo ellipta, sensipar, lasix 80 non hd days, metoprolol 50 bid, midodrine 10 bid, rena vit, renvela 1-2 ac,         OP HD: from 07/02/21 > MWF AF  4h  400/800   50kg  2/2 bath  Heparin 2000  RUE AVF (maturing)    - 1st stage R basilic vein AVF creation done by Dr Trula Slade on 04/04/21    CXR 11/18 - IMPRESSION: New left IJ central line tip overlies SVC.  No pneumothorax. Persistent patchy bilateral opacities and layering right pleural effusion.   Assessment/ Plan: SP cardiac arrest - on 11/18.  VDRF -  now extubated ESRD - usual HD is MWF.  Started CRRT 11/18 d/t hypotension post arrest. Will stop CRRT today.  I am not entirely sure he will tolerate intermittent HD and I have told him this -  we will try tomorrow.  He says he is willing to go 4 days a week which we likely will need -  issues with compliance in the past so not sure how sincere this promise is BP/volume - w/ LE edema, on max po midodrine - off pressors. will try UF 100-150 cc/hr. He is very tachycardic and we cannot use any agents to lower HR HD access - TDC removed last week for hospice transition. Pt changed his mind and was admitted. We tried to use his AVF but wasn't mature enough. IR placed new [place]  on 11/16. The AVF 1st stage was in August. [facility] saw pt and was going to have 2nd stage AVF surgery here but had cardiac arrest prior to the procedure. Per [facility] will reassess in 1-2 mos when more stable.  MBD ckd - phos is fine right now- least of his issues-   Anemia ckd - Hb 10's, now has dropped -  will add ESA and trend    Thomas Robinson  07/23/2021, 7:28 AM   Recent Labs  Lab 07/22/21 0353 07/22/21 0454 07/22/21 1631 07/23/21 0431  K 4.7 4.7 4.2 4.1  BUN 6* 7* 6* 7*  CREATININE 1.61* 1.64* 1.41* 1.36*  CALCIUM 8.2* 8.0* 7.6* 8.1*  PHOS  --  2.1* 4.0  --   HGB 9.0*  --   --  8.7*   Inpatient medications:  (feeding supplement) PROSource Plus  30 mL Oral BID BM   acetaminophen  650 mg Oral Q6H   Chlorhexidine Gluconate Cloth  6 each Topical Daily   feeding supplement  237 mL  Oral BID BM   Gerhardt's butt cream   Topical Daily   insulin aspart  0-9 Units Subcutaneous Q4H   mouth rinse  15 mL Mouth Rinse BID   midodrine  10 mg Oral TID   multivitamin  1 tablet Oral QHS   mupirocin ointment   Topical TID   nystatin   Topical BID   pantoprazole  40 mg Oral Daily   sodium chloride flush  3 mL Intravenous Q12H     prismasol BGK 4/2.5 400 mL/hr at 07/22/21 1919    prismasol BGK 4/2.5 200 mL/hr at 07/22/21 1919   sodium chloride     dextrose 40 mL/hr at 07/23/21 0700   heparin 10,000 units/ 20 mL infusion syringe 350 Units/hr (07/22/21 2159)   prismasol BGK 4/2.5 1,750 mL/hr at 07/23/21 0435   sodium chloride, alteplase, fluticasone furoate-vilanterol, guaiFENesin-dextromethorphan, heparin, ipratropium-albuterol, oxyCODONE, sodium chloride, sodium chloride flush

## 2021-07-23 NOTE — Progress Notes (Signed)
Nutrition Follow-up  DOCUMENTATION CODES:   Not applicable  INTERVENTION:   Recommend liberalizing diet to Regular, allow pt to have pizza. Discussed with MD [place] and received verbal order to liberalize diet  Ensure Enlive po BID, each supplement provides 350 kcal and 20 grams of protein   Continue Pro-Source 30 mL BID   Continue Renal MVI  NUTRITION DIAGNOSIS:   Increased nutrient needs related to chronic illness (ESRD on HD) as evidenced by estimated needs.  Being addressed via supplements, diet liberalization  GOAL:   Patient will meet greater than or equal to 90% of their needs  Progressing  MONITOR:   PO intake, Supplement acceptance, Weight trends, Labs, I & O's  REASON FOR ASSESSMENT:   Ventilator    ASSESSMENT:   60 yo male admitted with possible edema secondary to missed dialysis, had brief arrest in preop for AVF creation and required intubation. PMH includes CHF, chronic respiratory failure on home oxygen, CHF, ESRD on HD, CVA   11/15 Admitted 11/16 IR placed [facility] 11/18 Brief cardiac arrest, planned AVF creation-now plan for outpatient 11/20 Extubated  Re-consulted today for poor appetite. Pt not eating well per RN, no recorded po intake since Breakfast yesterday. Pt requesting pizza, all he wants is pizza  Weight down to 60.6 kg; still above EDW  Plan to stop CRRT today, plan to trial iHD tomorrow. Noted plan for iHD 4 days/week as outpatient   Labs: reviewed Meds: aranesp, Renal MVI, midodrine, ss novolog  Diet Order:   Diet Order             Diet regular Room service appropriate? Yes; Fluid consistency: Thin  Diet effective now                   EDUCATION NEEDS:   No education needs have been identified at this time  Skin:  Skin Assessment: Skin Integrity Issues: Skin Integrity Issues:: Stage II Stage II: coccyx  Last BM:  11/21  Height:   Ht Readings from Last 1 Encounters:  07/17/21 5\' 8"  (1.727 m)    Weight:    Wt Readings from Last 1 Encounters:  07/23/21 60.6 kg    Ideal Body Weight:     BMI:  Body mass index is 20.31 kg/m.  Estimated Nutritional Needs:   Kcal:  1800-2000  Protein:  100-125 g  Fluid:  1L+UOP  Kerman Passey MS, RDN, LDN, CNSC Registered Dietitian III Clinical Nutrition RD Pager and On-Call Pager Number Located in [place]

## 2021-07-24 DIAGNOSIS — I5032 Chronic diastolic (congestive) heart failure: Secondary | ICD-10-CM | POA: Diagnosis not present

## 2021-07-24 DIAGNOSIS — R531 Weakness: Secondary | ICD-10-CM | POA: Diagnosis not present

## 2021-07-24 DIAGNOSIS — I7121 Aneurysm of the ascending aorta, without rupture: Secondary | ICD-10-CM

## 2021-07-24 DIAGNOSIS — Z515 Encounter for palliative care: Secondary | ICD-10-CM | POA: Diagnosis not present

## 2021-07-24 DIAGNOSIS — N186 End stage renal disease: Secondary | ICD-10-CM | POA: Diagnosis not present

## 2021-07-24 DIAGNOSIS — I10 Essential (primary) hypertension: Secondary | ICD-10-CM

## 2021-07-24 DIAGNOSIS — D638 Anemia in other chronic diseases classified elsewhere: Secondary | ICD-10-CM | POA: Diagnosis not present

## 2021-07-24 DIAGNOSIS — I4892 Unspecified atrial flutter: Secondary | ICD-10-CM | POA: Diagnosis not present

## 2021-07-24 LAB — CBC
HCT: 29 % — ABNORMAL LOW (ref 39.0–52.0)
Hemoglobin: 9 g/dL — ABNORMAL LOW (ref 13.0–17.0)
MCH: 32.7 pg (ref 26.0–34.0)
MCHC: 31 g/dL (ref 30.0–36.0)
MCV: 105.5 fL — ABNORMAL HIGH (ref 80.0–100.0)
Platelets: 108 10*3/uL — ABNORMAL LOW (ref 150–400)
RBC: 2.75 MIL/uL — ABNORMAL LOW (ref 4.22–5.81)
RDW: 20.2 % — ABNORMAL HIGH (ref 11.5–15.5)
WBC: 5.3 10*3/uL (ref 4.0–10.5)
nRBC: 0 % (ref 0.0–0.2)

## 2021-07-24 LAB — CBC WITH DIFFERENTIAL/PLATELET
Abs Immature Granulocytes: 0.02 10*3/uL (ref 0.00–0.07)
Basophils Absolute: 0.1 10*3/uL (ref 0.0–0.1)
Basophils Relative: 1 %
Eosinophils Absolute: 0.1 10*3/uL (ref 0.0–0.5)
Eosinophils Relative: 2 %
HCT: 28.6 % — ABNORMAL LOW (ref 39.0–52.0)
Hemoglobin: 8.7 g/dL — ABNORMAL LOW (ref 13.0–17.0)
Immature Granulocytes: 0 %
Lymphocytes Relative: 13 %
Lymphs Abs: 0.7 10*3/uL (ref 0.7–4.0)
MCH: 32.6 pg (ref 26.0–34.0)
MCHC: 30.4 g/dL (ref 30.0–36.0)
MCV: 107.1 fL — ABNORMAL HIGH (ref 80.0–100.0)
Monocytes Absolute: 0.4 10*3/uL (ref 0.1–1.0)
Monocytes Relative: 8 %
Neutro Abs: 4 10*3/uL (ref 1.7–7.7)
Neutrophils Relative %: 76 %
Platelets: 98 10*3/uL — ABNORMAL LOW (ref 150–400)
RBC: 2.67 MIL/uL — ABNORMAL LOW (ref 4.22–5.81)
RDW: 20.3 % — ABNORMAL HIGH (ref 11.5–15.5)
WBC: 5.3 10*3/uL (ref 4.0–10.5)
nRBC: 0 % (ref 0.0–0.2)

## 2021-07-24 LAB — BASIC METABOLIC PANEL
Anion gap: 8 (ref 5–15)
BUN: 14 mg/dL (ref 8–23)
CO2: 25 mmol/L (ref 22–32)
Calcium: 9 mg/dL (ref 8.9–10.3)
Chloride: 104 mmol/L (ref 98–111)
Creatinine, Ser: 2.32 mg/dL — ABNORMAL HIGH (ref 0.61–1.24)
GFR, Estimated: 31 mL/min — ABNORMAL LOW (ref >60)
Glucose, Bld: 80 mg/dL (ref 70–99)
Potassium: 4.5 mmol/L (ref 3.5–5.1)
Sodium: 137 mmol/L (ref 135–145)

## 2021-07-24 LAB — RENAL FUNCTION PANEL
Albumin: 2.2 g/dL — ABNORMAL LOW (ref 3.5–5.0)
Anion gap: 8 (ref 5–15)
BUN: 25 mg/dL — ABNORMAL HIGH (ref 8–23)
CO2: 25 mmol/L (ref 22–32)
Calcium: 9.3 mg/dL (ref 8.9–10.3)
Chloride: 102 mmol/L (ref 98–111)
Creatinine, Ser: 3.16 mg/dL — ABNORMAL HIGH (ref 0.61–1.24)
GFR, Estimated: 21 mL/min — ABNORMAL LOW (ref >60)
Glucose, Bld: 116 mg/dL — ABNORMAL HIGH (ref 70–99)
Phosphorus: 3.5 mg/dL (ref 2.5–4.6)
Potassium: 4.6 mmol/L (ref 3.5–5.1)
Sodium: 135 mmol/L (ref 135–145)

## 2021-07-24 LAB — GLUCOSE, CAPILLARY
Glucose-Capillary: 86 mg/dL (ref 70–99)
Glucose-Capillary: 124 mg/dL — ABNORMAL HIGH (ref 70–99)
Glucose-Capillary: 93 mg/dL (ref 70–99)
Glucose-Capillary: 99 mg/dL (ref 70–99)

## 2021-07-24 MED ORDER — SODIUM CHLORIDE 0.9 % IV SOLN: 100.0000 mL | INTRAVENOUS | Status: DC | PRN

## 2021-07-24 MED ORDER — FLUTICASONE FUROATE-VILANTEROL 100-25 MCG/ACT IN AEPB
1.0000 | INHALATION_SPRAY | Freq: Every day | RESPIRATORY_TRACT | Status: DC
  Administered 2021-07-25: 1 via RESPIRATORY_TRACT
  Filled 2021-07-24: qty 28

## 2021-07-24 MED ORDER — LIDOCAINE HCL (PF) 1 % IJ SOLN: 5.0000 mL | INTRAMUSCULAR | Status: DC | PRN

## 2021-07-24 MED ORDER — PENTAFLUOROPROP-TETRAFLUOROETH EX AERO: 1.0000 "application " | INHALATION_SPRAY | CUTANEOUS | Status: DC | PRN

## 2021-07-24 MED ORDER — HEPARIN SODIUM (PORCINE) 1000 UNIT/ML DIALYSIS
1000.0000 [IU] | INTRAMUSCULAR | Status: DC | PRN
  Filled 2021-07-24: qty 1

## 2021-07-24 MED ORDER — LIDOCAINE-PRILOCAINE 2.5-2.5 % EX CREA: 1.0000 "application " | TOPICAL_CREAM | CUTANEOUS | Status: DC | PRN

## 2021-07-24 MED ORDER — ALTEPLASE 2 MG IJ SOLR: 2.0000 mg | Freq: Once | INTRAMUSCULAR | Status: DC | PRN

## 2021-07-24 NOTE — Progress Notes (Signed)
[facility] Progress Note  Subjective:  BP looks reasonable-  still tachy-  needed mod assist with much when doing PT-  they recommend SNF-  planning for IHD today to see if can tolerate-  he is nervous  Vitals:   07/24/21 0300 07/24/21 0400 07/24/21 0430 07/24/21 0500  BP: 123/79 102/73 113/78 109/81  Pulse: (!) 124 (!) 123 (!) 125 (!) 125  Resp: 12 (!) 27 15 17   Temp:      TempSrc:      SpO2: 99% 97% 99% 98%  Weight:      Height:        Exam:  alert, very weak-    no jvd  Chest cta bilat  Cor reg no RG  Abd soft ntnd no ascites  Ext 1-2+ diffuse bilat LE edema from feet to hips Neuro is alert, Ox 3 , nf, gen deconditioning   RUE AVF (maturing), +bruit     Home meds include - eliquis, breo ellipta, sensipar, lasix 80 non hd days, metoprolol 50 bid, midodrine 10 bid, rena vit, renvela 1-2 ac,         OP HD: from 07/02/21 > MWF AF  4h  400/800   50kg  2/2 bath  Heparin 2000  RUE AVF (maturing)    - 1st stage R basilic vein AVF creation done by Dr Trula Slade on 04/04/21    CXR 11/18 - IMPRESSION: New left IJ central line tip overlies SVC.  No pneumothorax. Persistent patchy bilateral opacities and layering right pleural effusion.   Assessment/ Plan: SP cardiac arrest - on 11/18.  VDRF -  now extubated ESRD - usual HD is MWF.  CRRT 11/18-11/22 due to hemodynamic instability and a lot of volume on board.  I am not entirely sure he will tolerate intermittent HD and I have told him this -  we will try today.  He says he is willing to go 4 days a week which we likely will need -  issues with compliance in the past so not sure how sincere this promise is BP/volume - w/ LE edema, on max po midodrine - off pressors. will try UF today with tricks on IHD. He is very tachycardic and we cannot use any agents to lower HR HD access - TDC removed last week for hospice transition. Pt changed his mind and was admitted. We tried to use his AVF but wasn't mature enough. IR placed new  [place] on 11/16. The AVF 1st stage was in August. [facility] saw pt and was going to have 2nd stage AVF surgery here but had cardiac arrest prior to the procedure. Per [facility] will reassess in 1-2 mos when more stable.  MBD ckd - phos is fine right now- least of his issues-   Anemia ckd - Hb 10's, now has dropped -  have added ESA and trend- will check iron stores in the AM Dispo-  very weak-  pt rec SNF-  I told him to strongly consider-  he thinks he is going to live with "Sharyn Lull"  there is a message in the chart that he is not welcome at her house anymore ??     Thomas Robinson  07/24/2021, 7:43 AM   Recent Labs  Lab 07/22/21 1631 07/23/21 0431 07/23/21 1714 07/24/21 0400  K 4.2 4.1 4.5 4.5  BUN 6* 7* 10 14  CREATININE 1.41* 1.36* 1.73* 2.32*  CALCIUM 7.6* 8.1* 8.5* 9.0  PHOS 4.0  --  2.7  --  HGB  --  8.7*  --  8.7*   Inpatient medications:  (feeding supplement) PROSource Plus  30 mL Oral BID BM   acetaminophen  650 mg Oral Q6H   apixaban  2.5 mg Oral BID   Chlorhexidine Gluconate Cloth  6 each Topical Daily   Chlorhexidine Gluconate Cloth  6 each Topical Q0600   darbepoetin (ARANESP) injection - DIALYSIS  150 mcg Intravenous Q Wed-HD   feeding supplement  237 mL Oral BID BM   Gerhardt's butt cream   Topical Daily   insulin aspart  0-9 Units Subcutaneous TID WC   mouth rinse  15 mL Mouth Rinse BID   midodrine  10 mg Oral TID   multivitamin  1 tablet Oral QHS   mupirocin ointment   Topical TID   nystatin   Topical BID   pantoprazole  40 mg Oral Daily   sodium chloride flush  3 mL Intravenous Q12H     prismasol BGK 4/2.5 Stopped (07/23/21 1145)    prismasol BGK 4/2.5 Stopped (07/23/21 1145)   sodium chloride     dextrose 20 mL/hr at 07/24/21 0403   prismasol BGK 4/2.5 1,750 mL/hr at 07/23/21 1022   sodium chloride, alteplase, fluticasone furoate-vilanterol, guaiFENesin-dextromethorphan, heparin, ipratropium-albuterol, oxyCODONE, sodium chloride, sodium chloride  flush

## 2021-07-24 NOTE — Progress Notes (Addendum)
Physical Therapy Treatment Patient Details Name: Thomas Robinson MRN: [id number] DOB: [birth year] Today's Date: 07/24/2021   History of Present Illness Pt adm 11/15 with chest pain and SOB likely due to missed HD. Pt had HD catheter removed week prior to transition to hospice and then changed his mind. Pt had tunneled HD catheter placed 11/16 and HD resumed. On 11/18 pt received block prior to AV graft and had cardiac arrest and intubated. CRRT initiated 11/18 and stopped 11/22. PMH includes HTN, chronic diastolic heart failure , ESRD on HD    PT Comments    Pt making progress with strength and mobility. Home situation not clear and since pt requiring assist for all mobility will keep recommendation for SNF. If pt refuses SNF and chooses to DC elsewhere would currently need and wheelchair and assist for any mobility.    Recommendations for follow up therapy are one component of a multi-disciplinary discharge planning process, led by the attending physician.  Recommendations may be updated based on patient status, additional functional criteria and insurance authorization.  Follow Up Recommendations  Skilled nursing-short term rehab (<3 hours/day) (Pt may refuse)     Assistance Recommended at Discharge Frequent or constant Supervision/Assistance  Equipment Recommendations  Wheelchair (measurements PT);Wheelchair cushion (measurements PT)    Recommendations for Other Services       Precautions / Restrictions Precautions Precautions: Fall     Mobility  Bed Mobility               General bed mobility comments: Pt up in chair    Transfers Overall transfer level: Needs assistance Equipment used: Rolling walker (2 wheels) Transfers: Sit to/from Stand Sit to Stand: Min assist           General transfer comment: Assist to bring hips up    Ambulation/Gait Ambulation/Gait assistance: Min assist Gait Distance (Feet): 5 Feet (forward and backward) Assistive device:  Rolling walker (2 wheels) Gait Pattern/deviations: Step-through pattern;Decreased stride length Gait velocity: decr Gait velocity interpretation: <1.31 ft/sec, indicative of household ambulator   General Gait Details: Assist for balance and support   Stairs             Wheelchair Mobility    Modified Rankin (Stroke Patients Only)       Balance Overall balance assessment: Needs assistance Sitting-balance support: No upper extremity supported;Feet supported Sitting balance-Leahy Scale: Fair     Standing balance support: Bilateral upper extremity supported;Reliant on assistive device for balance Standing balance-Leahy Scale: Poor Standing balance comment: walker and min guard for static standing                            Cognition Arousal/Alertness: Awake/alert Behavior During Therapy: WFL for tasks assessed/performed Overall Cognitive Status: Within Functional Limits for tasks assessed                                          Exercises      General Comments General comments (skin integrity, edema, etc.): HR to 150 with activity. On 3L 02      Pertinent Vitals/Pain Pain Assessment: No/denies pain    Home Living                          Prior Function  PT Goals (current goals can now be found in the care plan section) Progress towards PT goals: Progressing toward goals    Frequency    Min 3X/week      PT Plan Current plan remains appropriate    Co-evaluation              AM-PAC PT "6 Clicks" Mobility   Outcome Measure  Help needed turning from your back to your side while in a flat bed without using bedrails?: A Little Help needed moving from lying on your back to sitting on the side of a flat bed without using bedrails?: A Little Help needed moving to and from a bed to a chair (including a wheelchair)?: A Little Help needed standing up from a chair using your arms (e.g., wheelchair or  bedside chair)?: A Little Help needed to walk in hospital room?: A Little Help needed climbing 3-5 steps with a railing? : Total 6 Click Score: 16    End of Session Equipment Utilized During Treatment: Oxygen Activity Tolerance: Patient tolerated treatment well Patient left: in chair;with chair alarm set;with call bell/phone within reach;with family/visitor present   PT Visit Diagnosis: Muscle weakness (generalized) (M62.81);History of falling (Z91.81);Other abnormalities of gait and mobility (R26.89)     Time: 0940-1004 PT Time Calculation (min) (ACUTE ONLY): 24 min  Charges:  $Gait Training: 23-37 mins                     [place] Pager [phone] Office [place] 07/24/2021, 12:23 PM

## 2021-07-24 NOTE — Evaluation (Signed)
 Occupational Therapy Evaluation Patient Details Name: Thomas Robinson MRN: [id number] DOB: [birth year] Today's Date: 07/24/2021   History of Present Illness Pt adm 11/15 with chest pain and SOB likely due to missed HD. Pt had HD catheter removed week prior to transition to hospice and then changed his mind. Pt had tunneled HD catheter placed 11/16 and HD resumed. On 11/18 pt received block prior to AV graft and had cardiac arrest and intubated. CRRT initiated 11/18 and stopped 11/22. PMH includes HTN, chronic diastolic heart failure , ESRD on HD   Clinical Impression   PTA, pt lives with a friend and reports Modified Independence with ADLs, IADLs and mobility using Rollator. Pt reports driving, caring for his dog and use of supplemental O2 at baseline. Pt presents now with deficits in cardiopulmonary tolerance, strength, and standing balance. Pt requires Min A for short mobility using RW with assist to maneuver RW safely but showing improvements since initial PT eval. Pt requires Setup for UB ADLs and Mod A for LB ADLs due to deficits. Educated re: bathroom mobility with staff using RW, energy conservation strategies with emphasis on breathing techniques. Due to hands on assist needed for ADLs/mobility, recommend SNF rehab at DC. However, pt hopeful to progress quickly to return home. Plan to educate on UE HEP and progress ADL endurance in next sessions.  BP pre-activity: 129/84 (99) BP post-activity: 147/85 (103) HR 129 at rest, 132 with activity SpO2 89% on 2 L O2 with activity, quickly rebounded with seated rest break to 93%     Recommendations for follow up therapy are one component of a multi-disciplinary discharge planning process, led by the attending physician.  Recommendations may be updated based on patient status, additional functional criteria and insurance authorization.   Follow Up Recommendations  Skilled nursing-short term rehab (<3 hours/day)    Assistance Recommended at  Discharge Frequent or constant Supervision/Assistance  Functional Status Assessment  Patient has had a recent decline in their functional status and demonstrates the ability to make significant improvements in function in a reasonable and predictable amount of time.  Equipment Recommendations  None recommended by OT    Recommendations for Other Services       Precautions / Restrictions Precautions Precautions: Fall Precaution Comments: monitor O2 (reports wearing 2.5 L O2 baseline) Restrictions Weight Bearing Restrictions: No      Mobility Bed Mobility Overal bed mobility: Needs Assistance Bed Mobility: Supine to Sit;Sit to Supine     Supine to sit: Supervision;HOB elevated Sit to supine: Supervision   General bed mobility comments: for lines only    Transfers Overall transfer level: Needs assistance Equipment used: Rolling walker (2 wheels) Transfers: Sit to/from Stand Sit to Stand: Min guard           General transfer comment: min guard for safety in sit to stand with RW, does need Min A to turn/manuever RW with mobility      Balance Overall balance assessment: Needs assistance Sitting-balance support: No upper extremity supported;Feet supported Sitting balance-Leahy Scale: Fair     Standing balance support: Bilateral upper extremity supported;Reliant on assistive device for balance Standing balance-Leahy Scale: Poor Standing balance comment: reliant on BUE support in standing                           ADL either performed or assessed with clinical judgement   ADL Overall ADL's : Needs assistance/impaired Eating/Feeding: Independent;Sitting   Grooming: Set up;Sitting   Upper  Body Bathing: Set up;Sitting Upper Body Bathing Details (indicate cue type and reason): assist to bathe back, likely could do if in shower and/or with AE Lower Body Bathing: Sit to/from stand;Moderate assistance   Upper Body Dressing : Set up;Sitting   Lower Body  Dressing: Moderate assistance;Sit to/from stand   Toilet Transfer: Minimal assistance;Ambulation;Rolling walker (2 wheels)   Toileting- Clothing Manipulation and Hygiene: Minimal assistance;Sit to/from stand       Functional mobility during ADLs: Minimal assistance;Rolling walker (2 wheels) General ADL Comments: Pt with deficits in endurance and balance - reliant on DME for mobility and assist needed to manuever in small spaces and around corners. Increase risk for falls; benefits from energy conservation education during session     Vision Baseline Vision/History: 1 Wears glasses Ability to See in Adequate Light: 0 Adequate Patient Visual Report: No change from baseline Vision Assessment?: No apparent visual deficits     Perception     Praxis      Pertinent Vitals/Pain Pain Assessment: No/denies pain     Hand Dominance Right   Extremity/Trunk Assessment Upper Extremity Assessment Upper Extremity Assessment: Generalized weakness   Lower Extremity Assessment Lower Extremity Assessment: Defer to PT evaluation   Cervical / Trunk Assessment Cervical / Trunk Assessment: Kyphotic   Communication Communication Communication: No difficulties   Cognition Arousal/Alertness: Awake/alert Behavior During Therapy: WFL for tasks assessed/performed Overall Cognitive Status: Within Functional Limits for tasks assessed                                       General Comments  Chaplain present at start of session    Exercises     Shoulder Instructions      Home Living Family/patient expects to be discharged to:: Unsure Living Arrangements: Non-relatives/Friends   Type of Home: House Home Access: Stairs to enter Technical  of Steps: 2 Entrance Stairs-Rails: None Home Layout: One level     Bathroom Shower/Tub: Teacher, early years/pre: Standard     Home Equipment: Rollator (4 wheels);Cane - single point;Shower seat   Additional  Comments: Pt reports he has been living with a friend but per prior social work notes pt's friend did not want him returning after last admission.      Prior Functioning/Environment Prior Level of Function : History of Falls (last six months);Independent/Modified Independent;Driving             Mobility Comments: uses Rollator for mobility ADLs Comments: Reports Modified Independence with ADLs, IADLs, driving and grocery shopping (sometimes uses electric cart in store) and caring for dog (takes her to dog parks)        OT Problem List: Decreased strength;Decreased activity tolerance;Impaired balance (sitting and/or standing);Decreased safety awareness;Decreased knowledge of use of DME or AE;Cardiopulmonary status limiting activity      OT Treatment/Interventions: Self-care/ADL training;Therapeutic exercise;Energy conservation;DME and/or AE instruction;Therapeutic activities;Patient/family education;Balance training    OT Goals(Current goals can be found in the care plan section) Acute Rehab OT Goals Patient Stated Goal: beat the odds, return to independence, see my dog again OT Goal Formulation: With patient Time For Goal Achievement: 08/07/21 Potential to Achieve Goals: Good  OT Frequency: Min 2X/week   Barriers to D/C:            Co-evaluation              AM-PAC OT "6 Clicks" Daily Activity  Outcome Measure Help from another person eating meals?: None Help from another person taking care of personal grooming?: A Little Help from another person toileting, which includes using toliet, bedpan, or urinal?: A Little Help from another person bathing (including washing, rinsing, drying)?: A Lot Help from another person to put on and taking off regular upper body clothing?: A Little Help from another person to put on and taking off regular lower body clothing?: A Lot 6 Click Score: 17   End of Session Equipment Utilized During Treatment: Gait belt;Rolling walker (2  wheels);Oxygen Nurse Communication: Mobility status;Other (comment) (vitals)  Activity Tolerance: Patient tolerated treatment well Patient left: in bed;with call bell/phone within reach  OT Visit Diagnosis: Unsteadiness on feet (R26.81);Other abnormalities of gait and mobility (R26.89);Muscle weakness (generalized) (M62.81);History of falling (Z91.81)                Time: 4210-3128 OT Time Calculation (min): 28 min Charges:  OT General Charges $OT Visit: 1 Visit OT Evaluation $OT Eval Moderate Complexity: 1 Mod OT Treatments $Therapeutic Activity: 8-22 mins  Malachy Chamber, OTR/L Acute Rehab Services Office: [phone]   Layla Maw 07/24/2021, 1:47 PM

## 2021-07-24 NOTE — Progress Notes (Signed)
Patient ID: Thomas Robinson, male   DOB: 17-Feb-1961, 60 y.o.   MRN: [id number]    Progress Note from the Palliative Medicine Team at [facility]   Patient Name: Thomas Robinson        Date: 07/24/2021 DOB: 1961/07/04  Age: 60 y.o. MRN#: [id number] Attending Physician: Kipp Brood, MD Primary Care Physician: [name], [place] Admit Date: 07/16/2021   Medical records reviewed   60 year old male admitted on 07/16/2021 with a past medical history significant for chronic hypoxic respiratory failure on 2.5 L of oxygen via nasal cannula, chronic diastolic CHF, end-stage renal disease on dialysis, thoracic aortic aneurysm, atrial flutter on Eliquis, chronic anemia, secondary hyperparathyroidism, hypertension, hyperlipidemia, recent admission from 07/07/2021, patient left AMA on 07/11/2021.  At that time it was thought he was going to except hospice benefit at home.  He presented again on 07/16/2021 with chest pain.   On presentation, he had troponins of 74 and 75; chest x-ray findings suggestive of CHF with pulmonary edema and small right-sided pleural effusion.  Nephrology was consulted, he is in the process of reinstituting dialysis therapy.   Patient had brief cardiac arrest on 11/18 during  AV fistula placement.    Patient has had slow improvement over the last few days.  He is out of bed to the chair and worked with physical therapy today.  His condition is guarded, he is high risk for decompensation.  Concern for ability to tolerate dialysis  This NP visited patient at the bedside as a follow up for palliative medicine needs and to meet with [facility] and [facility] Smith/Cousin/ # [phone] for continued education  regarding his current medical situation, the important decisions he faces regarding his plan of care, and anticipatory care needs specific to his ability to tolerate dialysis treatment.  Education offered on hospice benefit; philosophy and eligibility.  Education offered on  the difference between aggressive medical intervention path and a palliative comfort path for this patient at this time in this situation was offered.  MOST form was completed and placed in chart  Patient and his support persons verbalized an understanding of the seriousness of his current medical situation.   Patient is able to verbalize that he understands at this point in time he would not be able to return home unless he had dramatic improvement.  He is open to SNF for short-term rehab.   Patient is hopeful to secure healthcare power of attorney with the assistance of spiritual care.  Will place referral.   Plan of Care:  Patient is open to all offered and available medical interventions to prolong life.  He is hopeful for continued improvement and ultimately her to return home with increase in dependence to spend time with his dog Calley   Continued education  had regarding his current medical situation, the important decisions he faces regarding his plan of care, and anticipatory care needs specific to his ability to tolerate dialysis treatment.  Discussed with patient the importance of continued conversation with his support people and the medical providers regarding overall plan of care and treatment options,  ensuring decisions are within the context of the patients values and GOCs.  Questions and concerns addressed        Discussed with bedside RN  This nurse practitioner informed  the patient/family that I will be out of the hospital until Monday morning.  If the patient is still hospitalized I will follow-up at that time.  Call palliative medicine team phone # [phone]  with questions or concerns in the interim.    Total time spent on the unit was 35 minutes  Greater than 50% of the time was spent in counseling and coordination of care  [name] NP  Palliative Medicine Team  Pager [phone]

## 2021-07-24 NOTE — Progress Notes (Signed)
This chaplain is present with the Pt., notary, and two witnesses for the notarization of the Pt. Advance Directive:  HCPOA.  The Pt. named Leonard Downing as his healthcare agent. If this person is unable or unwilling to serve as the healthcare agent the Pt. next choice is [employer].  The chaplain gave the Pt. the original Advance Directive, along with two copies.  A copy of the AD was scanned into the Pt. EMR.  This chaplain is available for F/U spiritual care as needed.  Chaplain Sallyanne Kuster [phone]
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
## 2021-07-25 DIAGNOSIS — I50812 Chronic right heart failure: Secondary | ICD-10-CM

## 2021-07-25 DIAGNOSIS — I2781 Cor pulmonale (chronic): Secondary | ICD-10-CM

## 2021-07-25 DIAGNOSIS — I272 Pulmonary hypertension, unspecified: Secondary | ICD-10-CM

## 2021-07-25 DIAGNOSIS — N186 End stage renal disease: Secondary | ICD-10-CM | POA: Diagnosis not present

## 2021-07-25 LAB — RENAL FUNCTION PANEL
Albumin: 2.1 g/dL — ABNORMAL LOW (ref 3.5–5.0)
Albumin: 2.3 g/dL — ABNORMAL LOW (ref 3.5–5.0)
Anion gap: 8 (ref 5–15)
Anion gap: 8 (ref 5–15)
BUN: 26 mg/dL — ABNORMAL HIGH (ref 8–23)
BUN: 12 mg/dL (ref 8–23)
CO2: 24 mmol/L (ref 22–32)
CO2: 28 mmol/L (ref 22–32)
Calcium: 9.2 mg/dL (ref 8.9–10.3)
Calcium: 9 mg/dL (ref 8.9–10.3)
Chloride: 103 mmol/L (ref 98–111)
Chloride: 103 mmol/L (ref 98–111)
Creatinine, Ser: 3.2 mg/dL — ABNORMAL HIGH (ref 0.61–1.24)
Creatinine, Ser: 2.36 mg/dL — ABNORMAL HIGH (ref 0.61–1.24)
GFR, Estimated: 21 mL/min — ABNORMAL LOW (ref >60)
GFR, Estimated: 30 mL/min — ABNORMAL LOW (ref >60)
Glucose, Bld: 87 mg/dL (ref 70–99)
Glucose, Bld: 102 mg/dL — ABNORMAL HIGH (ref 70–99)
Phosphorus: 3.9 mg/dL (ref 2.5–4.6)
Phosphorus: 2.7 mg/dL (ref 2.5–4.6)
Potassium: 4.9 mmol/L (ref 3.5–5.1)
Potassium: 4.2 mmol/L (ref 3.5–5.1)
Sodium: 135 mmol/L (ref 135–145)
Sodium: 139 mmol/L (ref 135–145)

## 2021-07-25 LAB — CBC WITH DIFFERENTIAL/PLATELET
Abs Immature Granulocytes: 0.02 10*3/uL (ref 0.00–0.07)
Abs Immature Granulocytes: 0.01 10*3/uL (ref 0.00–0.07)
Basophils Absolute: 0.1 10*3/uL (ref 0.0–0.1)
Basophils Absolute: 0.1 10*3/uL (ref 0.0–0.1)
Basophils Relative: 2 %
Basophils Relative: 1 %
Eosinophils Absolute: 0.1 10*3/uL (ref 0.0–0.5)
Eosinophils Absolute: 0.1 10*3/uL (ref 0.0–0.5)
Eosinophils Relative: 3 %
Eosinophils Relative: 3 %
HCT: 29 % — ABNORMAL LOW (ref 39.0–52.0)
HCT: 30.8 % — ABNORMAL LOW (ref 39.0–52.0)
Hemoglobin: 9.1 g/dL — ABNORMAL LOW (ref 13.0–17.0)
Hemoglobin: 9.8 g/dL — ABNORMAL LOW (ref 13.0–17.0)
Immature Granulocytes: 0 %
Immature Granulocytes: 0 %
Lymphocytes Relative: 16 %
Lymphocytes Relative: 16 %
Lymphs Abs: 0.7 10*3/uL (ref 0.7–4.0)
Lymphs Abs: 0.8 10*3/uL (ref 0.7–4.0)
MCH: 33.5 pg (ref 26.0–34.0)
MCH: 33.3 pg (ref 26.0–34.0)
MCHC: 31.4 g/dL (ref 30.0–36.0)
MCHC: 31.8 g/dL (ref 30.0–36.0)
MCV: 106.6 fL — ABNORMAL HIGH (ref 80.0–100.0)
MCV: 104.8 fL — ABNORMAL HIGH (ref 80.0–100.0)
Monocytes Absolute: 0.4 10*3/uL (ref 0.1–1.0)
Monocytes Absolute: 0.5 10*3/uL (ref 0.1–1.0)
Monocytes Relative: 10 %
Monocytes Relative: 10 %
Neutro Abs: 3.2 10*3/uL (ref 1.7–7.7)
Neutro Abs: 3.3 10*3/uL (ref 1.7–7.7)
Neutrophils Relative %: 69 %
Neutrophils Relative %: 70 %
Platelets: 114 10*3/uL — ABNORMAL LOW (ref 150–400)
Platelets: 111 10*3/uL — ABNORMAL LOW (ref 150–400)
RBC: 2.72 MIL/uL — ABNORMAL LOW (ref 4.22–5.81)
RBC: 2.94 MIL/uL — ABNORMAL LOW (ref 4.22–5.81)
RDW: 20.6 % — ABNORMAL HIGH (ref 11.5–15.5)
RDW: 20.1 % — ABNORMAL HIGH (ref 11.5–15.5)
WBC: 4.6 10*3/uL (ref 4.0–10.5)
WBC: 4.7 10*3/uL (ref 4.0–10.5)
nRBC: 0 % (ref 0.0–0.2)
nRBC: 0 % (ref 0.0–0.2)

## 2021-07-25 LAB — IRON AND TIBC
Iron: 53 ug/dL (ref 45–182)
Iron: 69 ug/dL (ref 45–182)
Saturation Ratios: 33 % (ref 17.9–39.5)
Saturation Ratios: 39 % (ref 17.9–39.5)
TIBC: 161 ug/dL — ABNORMAL LOW (ref 250–450)
TIBC: 176 ug/dL — ABNORMAL LOW (ref 250–450)
UIBC: 108 ug/dL
UIBC: 107 ug/dL

## 2021-07-25 LAB — FERRITIN
Ferritin: 1074 ng/mL — ABNORMAL HIGH (ref 24–336)
Ferritin: 1115 ng/mL — ABNORMAL HIGH (ref 24–336)

## 2021-07-25 LAB — GLUCOSE, CAPILLARY
Glucose-Capillary: 83 mg/dL (ref 70–99)
Glucose-Capillary: 125 mg/dL — ABNORMAL HIGH (ref 70–99)
Glucose-Capillary: 120 mg/dL — ABNORMAL HIGH (ref 70–99)
Glucose-Capillary: 128 mg/dL — ABNORMAL HIGH (ref 70–99)
Glucose-Capillary: 88 mg/dL (ref 70–99)

## 2021-07-25 MED ORDER — METOPROLOL TARTRATE 12.5 MG HALF TABLET
12.5000 mg | ORAL_TABLET | Freq: Three times a day (TID) | ORAL | Status: DC
  Administered 2021-07-25 – 2021-07-26 (×5): 12.5 mg via ORAL
  Filled 2021-07-25 (×5): qty 1

## 2021-07-25 NOTE — Consult Note (Signed)
Cardiology Consultation:   Patient ID: Thomas Robinson MRN: [id number]; DOB: 1961-06-09  Admit date: 07/16/2021 Date of Consult: 07/25/2021  PCP:  Thomas Niece, PA   [facility] Providers Cardiologist:  Thomas Salines, DO        Patient Profile:   Thomas Robinson is a 60 y.o. male with a hx of chronic hypoxic respiratory failure on 2 L nasal cannula, chronic diastolic heart failure, severe pulm hypertension/cor pulmonale, ESRD on HD, hyperlipidemia, IDA, RLS, thoracic ascending aortic aneurysm, hypertension, and atrial flutter who is being seen 07/25/2021 for the evaluation of atrial flutter at the request of Thomas Griffes, MD.  History of Present Illness:   Thomas Robinson was admitted to the hospital on 07/16/2021 for volume overload in the setting of medication noncompliance.  He was recently admitted to the hospital between 07/07/2021 and 07/11/2021 for volume overload in the setting of an end-stage cor pulmonale picture.  I saw him during that hospitalization.  He was actually diagnosed with atrial flutter when admitted for pneumonia in late October.  He actually has been noncompliant with Eliquis and some of his blood thinners.  During his most recent hospitalization he was found to have gastritis and a drop in his hemoglobin.  He was cleared by gastroenterology to restart Eliquis.  He left that hospitalization and again was noncompliant.  He was actually sent home with hospice but informs me he does not want to pursue hospice care anymore.  He again was readmitted here on 07/16/2021 for volume overload.  He is undergone dialysis.  He actually was going to undergo AV fistula revision surgery on 07/19/2021.  Notes report that he received low-dose sedation and immediately developed PEA arrest.  He received CPR for 2 minutes and ROSC was achieved.  He has been in the ICU.  Has been weaned from mechanical ventilation.  He was on CRRT and has been transitioned back to intermittent HD.  He is  remained in atrial flutter this hospitalization.  His blood pressures have improved.  Cardiology was consulted regarding atrial flutter management.  At the time my interview he appears euvolemic.  He denies any chest pain or trouble breathing.  We discussed that he has an end-stage cardiomyopathy and he has had trouble with sedation and likely will continue to have trouble with sedation.  He has systemic pulmonary pressures and due to profound RV failure I believe he is not a candidate for procedures moving forward.  I believe this will just result in another cardiac arrest.  I did discuss this with him in the room today.  He informs me he is not ready to die or pursue hospice care.  I do understand this but he has limited options given the severity of the situation.  He has severe COPD.  He has severe cor pulmonale.  Telemetry shows he remains in atrial flutter.  He is hemodynamically stable with a blood pressure of 101/72.  Laboratory data shows serum creatinine 2.36.  Hemoglobin is stable at 9.8.  Past Medical History:  Diagnosis Date   Anaphylactic shock, unspecified, sequela 06/10/2019   Aortic stenosis    mild-moderate AS 12/2020 echo   Arthritis    Asthma, chronic, unspecified asthma severity, with acute exacerbation 10/23/2017   CAP (community acquired pneumonia) 10/23/2017   Carpal tunnel syndrome of right wrist 10/05/2018   Cataract    right - removed by surgery   Cerebral infarction due to thrombosis of cerebral artery (HCC)    Cervical disc herniation 01/04/2020  Chronic diastolic heart failure (HCC) 04/13/2018   Chronic low back pain 11/24/2019   Constipation    COPD (chronic obstructive pulmonary disease) (HCC)    Cough    chronic cough   Dyspnea    Encounter for immunization 07/08/2017   ESRD on hemodialysis ([place]) 02/16/2018   Fall 06/04/2019   GERD (gastroesophageal reflux disease) 06/04/2019   GI bleeding 05/24/2017   Gram-negative sepsis, unspecified ([place]) 09/05/2017    History of fusion of cervical spine 03/26/2020   Hyperlipidemia    Hypertension    Hypokalemia 06/04/2017   Iron deficiency anemia, unspecified 09/09/2017   Left shoulder pain 11/11/2019   Lumbar radiculopathy 11/24/2019   Lung contusion 06/04/2019   LVH (left ventricular hypertrophy) due to hypertensive disease, with heart failure ([place]) 06/18/2020   Macrocytic anemia 05/24/2017   Moderate protein-calorie malnutrition ([place]) 06/06/2017   Myofascial pain syndrome 06/12/2020   Neuritis of right ulnar nerve 09/13/2018   Non-compliance with renal dialysis ([place]) 02/08/2020   Oxygen deficiency 12/30/2019   O2 sats on RA 87% at PAT appt    Pain in joint of right elbow 09/13/2018   Pulmonary hypertension ([place])    Renovascular hypertension 06/22/2020   Restless leg syndrome    Rib fractures 06/2019   Right   S/P cardiac cath 08/27/2020   normal coronary arteries.   Secondary hyperparathyroidism of renal origin ([place]) 06/04/2017   Thoracic ascending aortic aneurysm    4.5 cm 06/04/19 CT   Ulnar neuropathy 10/05/2018   Volume overload 10/23/2017    Past Surgical History:  Procedure Laterality Date   A/V FISTULAGRAM N/A 01/01/2021   Procedure: A/V FISTULAGRAM;  Surgeon: Serafina Mitchell, MD;  Location: [place] [facility];  Service: Cardiovascular;  Laterality: N/A;   A/V FISTULAGRAM N/A 01/15/2021   Procedure: A/V [id number];  Surgeon: Serafina Mitchell, MD;  Location: [place] [facility];  Service: Cardiovascular;  Laterality: N/A;   ANTERIOR CERVICAL DECOMP/DISCECTOMY FUSION N/A 01/04/2020   Procedure: ANTERIOR CERVICAL DECOMPRESSION/DISCECTOMY FUSION CERVICAL FIVE THROUGH SEVEN;  Surgeon: Melina Schools, MD;  Location: [place];  Service: Orthopedics;  Laterality: N/A;  3 hrs   AV FISTULA PLACEMENT Left 05/28/2017   Procedure: LEFT ARM ARTERIOVENOUS (AV) FISTULA CREATION;  Surgeon: Conrad Riverview, MD;  Location: [place];  Service: Vascular;  Laterality: Left;   AV FISTULA PLACEMENT Left 02/02/2021    Procedure: Debride left forearm, ligation of left upper arm Aretiovenous fistula;  Surgeon: Elam Dutch, MD;  Location: [place];  Service: Vascular;  Laterality: Left;   AV FISTULA PLACEMENT Right 04/04/2021   Procedure: RIGHT ARTERIOVENOUS (AV) FISTULA CREATION;  Surgeon: Serafina Mitchell, MD;  Location: [place] [facility];  Service: Vascular;  Laterality: Right;   BIOPSY  07/10/2021   Procedure: BIOPSY;  Surgeon: Lavena Bullion, DO;  Location: [place] [facility];  Service: Gastroenterology;;   COLONOSCOPY WITH PROPOFOL N/A 07/10/2021   Procedure: COLONOSCOPY WITH PROPOFOL;  Surgeon: Lavena Bullion, DO;  Location: [place];  Service: Gastroenterology;  Laterality: N/A;   ESOPHAGOGASTRODUODENOSCOPY (EGD) WITH PROPOFOL N/A 07/10/2021   Procedure: ESOPHAGOGASTRODUODENOSCOPY (EGD) WITH PROPOFOL;  Surgeon: Lavena Bullion, DO;  Location: [place];  Service: Gastroenterology;  Laterality: N/A;   EYE SURGERY Right 06/02/2019   Cataract removed   FRACTURE SURGERY     INSERTION OF DIALYSIS CATHETER Right 02/02/2021   Procedure: INSERTION OF Right Internal jugular TUNNELED  DIALYSIS CATHETER;  Surgeon: Elam Dutch, MD;  Location: [place];  Service: Vascular;  Laterality: Right;  IR DIALY SHUNT INTRO NEEDLE/INTRACATH INITIAL W/IMG LEFT Left 09/12/2020   IR FLUORO GUIDE CV LINE RIGHT  05/25/2017   IR FLUORO GUIDE CV LINE RIGHT  07/17/2021   IR US GUIDE VASC ACCESS LEFT  09/12/2020   IR US GUIDE VASC ACCESS RIGHT  05/25/2017   IR US GUIDE VASC ACCESS RIGHT  07/17/2021   LIGATION OF ARTERIOVENOUS  FISTULA Left 01/10/2021   Procedure: LIGATION OF LEFT ARM RADIOCEPHALIC FISTULA;  Surgeon: Serafina Mitchell, MD;  Location: [place];  Service: Vascular;  Laterality: Left;   PERIPHERAL VASCULAR BALLOON ANGIOPLASTY Left 01/15/2021   Procedure: PERIPHERAL VASCULAR BALLOON ANGIOPLASTY;  Surgeon: Serafina Mitchell, MD;  Location: [place] [facility];  Service: Cardiovascular;  Laterality: Left;  AVF   REVISON OF  ARTERIOVENOUS FISTULA Left 07/18/2019   Procedure: REVISION PLICATION OF RADIOCEPHALIC ARTERIOVENOUS FISTULA LEFT ARM;  Surgeon: Angelia Mould, MD;  Location: [place];  Service: Vascular;  Laterality: Left;   REVISON OF ARTERIOVENOUS FISTULA Left 01/10/2021   Procedure: CONVERSION TO BRACHIOCEPHALIC ARTERIOVENOUS FISTULA;  Surgeon: Serafina Mitchell, MD;  Location: [facility];  Service: Vascular;  Laterality: Left;   RINOPLASTY       Home Medications:  Prior to Admission medications   Medication Sig Start Date End Date Taking? Authorizing Provider  albuterol (VENTOLIN HFA) 108 (90 Base) MCG/ACT inhaler Inhale 2 puffs into the lungs 2 (two) times daily as needed for shortness of breath or wheezing. 06/01/21  Yes Lama, Marge Duncans, MD  BREO ELLIPTA 100-25 MCG/INH AEPB Inhale 1 puff into the lungs daily. Patient taking differently: Inhale 1 puff into the lungs daily as needed (shortness of breath). 06/01/21 07/31/21 Yes Oswald Hillock, MD  furosemide (LASIX) 80 MG tablet Take 1 tablet (80 mg total) by mouth daily as needed for fluid (Use only on non-HD days (Tues/Thur/Sat/Sun)). Patient taking differently: Take 80 mg by mouth daily. 06/01/21  Yes Oswald Hillock, MD  metoprolol tartrate (LOPRESSOR) 50 MG tablet Take 1 tablet (50 mg total) by mouth 2 (two) times daily. 06/01/21  Yes Oswald Hillock, MD  midodrine (PROAMATINE) 10 MG tablet Take 1 tablet (10 mg total) by mouth 3 (three) times daily with meals. Patient taking differently: Take 10 mg by mouth 2 (two) times daily. 07/05/21 08/04/21 Yes Antonieta Pert, MD  multivitamin (RENA-VIT) TABS tablet Take 1 tablet by mouth at bedtime. 01/12/18  Yes [provider]  sevelamer carbonate (RENVELA) 800 MG tablet Take 800-1,600 mg by mouth See admin instructions. Take 1-2 tablets ([phone] mg) by mouth three times daily with meals, take 1 tablet (800 mg) with snacks 05/31/21  Yes [provider]  apixaban (ELIQUIS) 2.5 MG TABS tablet Take 1 tablet (2.5 mg  total) by mouth 2 (two) times daily. Patient not taking: No sig reported 07/13/21 08/12/21  Darliss Cheney, MD  cinacalcet (SENSIPAR) 60 MG tablet Take 1 tablet (60 mg total) by mouth every evening. Patient not taking: No sig reported 06/01/21   Oswald Hillock, MD    Inpatient Medications: Scheduled Meds:  (feeding supplement) PROSource Plus  30 mL Oral BID BM   acetaminophen  650 mg Oral Q6H   apixaban  2.5 mg Oral BID   Chlorhexidine Gluconate Cloth  6 each Topical Daily   Chlorhexidine Gluconate Cloth  6 each Topical Q0600   darbepoetin (ARANESP) injection - DIALYSIS  150 mcg Intravenous Q Wed-HD   feeding supplement  237 mL Oral BID BM   fluticasone furoate-vilanterol  1  puff Inhalation Daily   Gerhardt's butt cream   Topical Daily   insulin aspart  0-9 Units Subcutaneous TID WC   mouth rinse  15 mL Mouth Rinse BID   metoprolol tartrate  12.5 mg Oral TID   midodrine  10 mg Oral TID   multivitamin  1 tablet Oral QHS   mupirocin ointment   Topical TID   nystatin   Topical BID   pantoprazole  40 mg Oral Daily   sodium chloride flush  3 mL Intravenous Q12H   Continuous Infusions:  sodium chloride     sodium chloride     sodium chloride     dextrose 20 mL/hr at 07/25/21 0400   PRN Meds: sodium chloride, sodium chloride, sodium chloride, alteplase, alteplase, guaiFENesin-dextromethorphan, heparin, heparin, ipratropium-albuterol, lidocaine (PF), lidocaine-prilocaine, oxyCODONE, pentafluoroprop-tetrafluoroeth, sodium chloride, sodium chloride flush  Allergies:    Allergies  Allergen Reactions   Vancomycin Shortness Of Breath and Itching    Social History:   Social History   Socioeconomic History   Marital status: Single    Spouse name: Not on file   Number of children: Not on file   Years of education: Not on file   Highest education level: Not on file  Occupational History   Not on file  Tobacco Use   Smoking status: Never   Smokeless tobacco: Never  Vaping Use    Vaping Use: Never used  Substance and Sexual Activity   Alcohol use: Not Currently    Alcohol/week: 0.0 standard drinks    Comment: has a drink once in a while- 12/30/19   Drug use: No   Sexual activity: Not Currently  Other Topics Concern   Not on file  Social History Narrative   Lives with a roommate in a one story home.  Has 1 child.  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  Education: high school.   Social Determinants of Health   Financial Resource Strain: Not on file  Food Insecurity: Not on file  Transportation Needs: Not on file  Physical Activity: Not on file  Stress: Not on file  Social Connections: Not on file  Intimate Partner Violence: Not on file    Family History:    Family History  Problem Relation Age of Onset   Cancer Mother      ROS:  Please see the history of present illness.   All other ROS reviewed and negative.     Physical Exam/Data:   Vitals:   07/25/21 1300 07/25/21 1400 07/25/21 1500 07/25/21 1600  BP:      Pulse: (!) 129 (!) 123 (!) 129 (!) 129  Resp: 19 (!) 21 (!) 23 (!) 21  Temp:    99.3 F (37.4 C)  TempSrc:    Oral  SpO2: 97% 95% 95% 98%  Weight:      Height:        Intake/Output Summary (Last 24 hours) at 07/25/2021 1649 Last data filed at 07/25/2021 1600 Gross per 24 hour  Intake 518.75 ml  Output 1365 ml  Net -846.25 ml   Last 3 Weights 07/25/2021 07/25/2021 07/23/2021  Weight (lbs) 130 lb 1.1 oz 133 lb 2.5 oz 133 lb 9.6 oz  Weight (kg) 59 kg 60.4 kg 60.6 kg     Body mass index is 19.78 kg/m.  General: Ill-appearing HEENT: normal Neck: no JVD Vascular: No carotid bruits; Distal pulses 2+ bilaterally Cardiac:  normal S1, S2; RRR; 2 out of 6 holosystolic murmur, tachycardia noted Lungs:  Diminished breath sounds bilaterally Abd: soft, nontender, no hepatomegaly  Ext: no edema Musculoskeletal:  No deformities, BUE and BLE strength normal and equal Skin: warm and dry  Neuro:  CNs 2-12 intact, no focal abnormalities  noted Psych:  Normal affect   EKG:  The EKG was personally reviewed and demonstrates: Atrial flutter heart rate 100, RVH Telemetry:  Telemetry was personally reviewed and demonstrates: Atrial flutter heart rate 130s  Relevant CV Studies:  TTE 07/19/2021  1. Near cavity obliteration. There is an intracavitary gradient. Peak  velocity 3.58 m/s. Peak gradient 51.3 mmHg. Left ventricular ejection  fraction, by estimation, is >75%. The left ventricle has hyperdynamic  function. The left ventricle has no  regional wall motion abnormalities.   2. Right ventricle is heavily trabeculated. Right ventricular systolic  function is severely reduced. There is severely elevated pulmonary artery  systolic pressure.   3. Agitated saline contrast bubble study was negative, with no evidence  of any interatrial shunt.   4. A small pericardial effusion is present.   5. Moderate to severe mitral annular calcification.   6. Thickening of the mitral valve leaflets. Tricuspid valve regurgitation  is moderate to severe.   7. The inferior vena cava is dilated in size with <50% respiratory  variability, suggesting right atrial pressure of 15 mmHg.   Echo 07/02/2021: Hyperdynamic LV function, 70 to 75%, severe RV dilation with severely reduced RV function RUQ [place] 07/04/2021: Hepatic steatosis Cardiac Cath 08/27/2020 [facility]): Normal coronaries, Normal LVEDP PFT (06/26/2020 [facility]): Severe obstructive ventilatory defect  CT PE 06/30/2021: No PE V/Q: Negative   Laboratory Data:  High Sensitivity Troponin:   Recent Labs  Lab 07/01/21 1329 07/02/21 0420 07/16/21 0922 07/16/21 1150 07/19/21 1100  TROPONINIHS 144* 202* 74* 75* 168*     Chemistry Recent Labs  Lab 07/20/21 0425 07/20/21 1518 07/21/21 0400 07/22/21 0353 07/24/21 1953 07/25/21 0340 07/25/21 0830  NA 134*  134*  134*   < > 134*   < > 135 135 139  K 4.0  4.0  4.0   < > 4.3   < > 4.6 4.9 4.2  CL 102  101  100  --  101   < > 102 103  103  CO2 23  22  22   --  26   < > 25 24 28   GLUCOSE 117*  119*  118*  --  105*   < > 116* 87 102*  BUN 28*  29*  28*  --  15   < > 25* 26* 12  CREATININE 4.77*  4.68*  4.77*  --  2.31*   < > 3.16* 3.20* 2.36*  CALCIUM 7.7*  7.7*  7.6*  --  7.8*   < > 9.3 9.2 9.0  MG 2.0  --  2.2  --   --   --   --   GFRNONAA 13*  13*  13*  --  31*   < > 21* 21* 30*  ANIONGAP 9  11  12   --  7   < > 8 8 8    < > = values in this interval not displayed.    Recent Labs  Lab 07/20/21 0425 07/21/21 0400 07/22/21 0454 07/24/21 1953 07/25/21 0340 07/25/21 0830  PROT 4.9* 5.0*  --   --   --   --   ALBUMIN 2.0*  2.0*  2.0* 1.9*  1.9*   < > 2.2* 2.1* 2.3*  AST 61* 27  --   --   --   --  ALT 14 13  --   --   --   --   ALKPHOS 119 107  --   --   --   --   BILITOT 1.0 0.8  --   --   --   --    < > = values in this interval not displayed.   Lipids  Recent Labs  Lab 07/20/21 0425  TRIG 72    Hematology Recent Labs  Lab 07/24/21 1953 07/25/21 0340 07/25/21 0828  WBC 5.3 4.6 4.7  RBC 2.75* 2.72* 2.94*  HGB 9.0* 9.1* 9.8*  HCT 29.0* 29.0* 30.8*  MCV 105.5* 106.6* 104.8*  MCH 32.7 33.5 33.3  MCHC 31.0 31.4 31.8  RDW 20.2* 20.6* 20.1*  PLT 108* 114* 111*   Thyroid No results for input(s): TSH, FREET4 in the last 168 hours.  BNPNo results for input(s): BNP, PROBNP in the last 168 hours.  DDimer No results for input(s): DDIMER in the last 168 hours.   Radiology/Studies:  No results found.   Assessment and Plan:   #Severe pulmonary hypertension with systemic PA pressures #Cor pulmonale #End-stage right heart failure #Severe COPD -Mr. Francesconi is well-known to our service.  He has an end-stage cardiomyopathy in the setting of severe pulmonary pretension related to severe obstructive ventilatory defects.   -This was determined on PFTs at [facility].  He is undergone a VQ scan which is negative for chronic thrombotic pulm hypertension.  He had a left heart catheterization  at [facility] with normal LVEDP and normal coronaries. He does not have HFpEF.  -He has severe COPD and this is the likely explanation for his right heart failure. -He likely has an end-stage cardiomyopathy/cor pulmonale.  This is further evidence as he recently underwent light sedation per reports on 07/19/2021 for AV fistula revision surgery.  With induction of anesthesia he apparently suffered a cardiac arrest.  I believe this highlights the frailty of his cardiac condition.  I believe further attempts at surgery would be extremely high risk for him to survive.  This would include TEE/cardioversion.  I did discuss this with him in the room today. -I really believe all we can offer him is a palliative approach at this time.  His volume status is controlled with hemodialysis.  His lung should be treated with oxygen therapy and optimized by pulmonary.  -I did discuss his case last hospitalization with advanced heart failure but he has no options given his severe lung disease, very advanced right heart failure as well as ESRD on hemodialysis. -Furthermore he is noncompliant with medications and leave the hospital AMA all the time.  This is quite problematic. -My overall recommendation for his cardiovascular care is hospice care.  I have asked him to reconsider this.  Would have palliative care to continue to follow while he is here.  #Atrial flutter #Gastritis -He is had atrial flutter since late October where he was diagnosed with pneumonia.  He has been noncompliant with Eliquis.  During his last admission he was found to have anemia and gastritis.  He was treated by GI and they recommended to restart his Eliquis on 07/13/2021.  Apparently he was not compliant with this. -I think it is okay to restart Eliquis as his platelet counts have normalized.  We will defer this to hospital medicine. GI recommended to restart 11/12 after EGD. -Regarding treatment for his atrial flutter I believe he is too  high risk for TEE/cardioversion.  He has been very noncompliant  with Eliquis and I do not believe he will be compliant moving forward.  I also believe given his recent cardiac arrest with anesthesia for AV fistula revision surgery that he would be extremely high risk for repeat PEA arrest for any sedation.  This highlights the severity of his RV failure.  His overall status is tenuous.  I really believe the best option for him is hospice care.  I discussed this with him today. -For now would rechallenge him on metoprolol as you are doing.  Continue 12.5 mg 3 times daily. -From my standpoint he can be back on Eliquis. -We can see how he does out of the ICU and possibly consider TEE/cardioversion however I believe this would be very high risk for a bad outcome.   For questions or updates, please contact [place] Please consult [email or web address] for contact info under   [employer]. Audie Box, MD, [place]  [address], [place], [place] [address] [phone]  5:36 PM

## 2021-07-25 NOTE — Progress Notes (Signed)
[facility] Progress Note  Subjective:  HD done overnight-  able to remove almost 1400-  did not look like BP dropped or HR increased with HD  " I survived"   Vitals:   07/25/21 0500 07/25/21 0600 07/25/21 0615 07/25/21 0826  BP: 129/79     Pulse: (!) 125 (!) 128    Resp: 19     Temp:   98.2 F (36.8 C)   TempSrc:   Oral   SpO2: 100% 100%  98%  Weight:   59 kg   Height:        Exam:  alert, very weak-    no jvd  Chest cta bilat  Cor reg no RG  Abd soft ntnd no ascites  Ext 1-2+ diffuse bilat LE edema from feet to hips Neuro is alert, Ox 3 , nf, gen deconditioning   RUE AVF (maturing), +bruit     Home meds include - eliquis, breo ellipta, sensipar, lasix 80 non hd days, metoprolol 50 bid, midodrine 10 bid, rena vit, renvela 1-2 ac,         OP HD: from 07/02/21 > MWF AF  4h  400/800   50kg  2/2 bath  Heparin 2000  RUE AVF (maturing)    - 1st stage R basilic vein AVF creation done by Dr Trula Slade on 04/04/21    CXR 11/18 - IMPRESSION: New left IJ central line tip overlies SVC.  No pneumothorax. Persistent patchy bilateral opacities and layering right pleural effusion.   Assessment/ Plan: SP cardiac arrest - on 11/18.  VDRF -  now extubated ESRD - usual HD is MWF.  CRRT 11/18-11/22 due to hemodynamic instability and a lot of volume on board.  HD done last night - tolerated reasonably well it seems He says he is willing to go 4 days a week which we likely will need -  issues with compliance in the past so not sure how sincere this promise is.  While in house planning for HD Fri and Sat -  I will talk to AF about running MWFS BP/volume - w/ LE edema, on max po midodrine - off pressors. will try and maximize UF  with tricks on IHD. He is very tachycardic and we cannot use any agents to lower HR HD access - TDC removed last week for hospice transition. Pt changed his mind and was admitted. We tried to use his AVF but wasn't mature enough. IR placed new [place] on 11/16. The  AVF 1st stage was in August. [facility]VS saw pt and was going to have 2nd stage AVF surgery here but had cardiac arrest prior to the procedure. Per VVS will reassess in 1-2 mos when more stable.  MBD ckd - phos is fine right now no binder- least of his issues-   Anemia ckd - Hb 10's, now has dropped -  have added ESA and trend- iron stores pending Dispo-  very weak-  PT rec SNF-  I told him to strongly consider-  he thinks he is going to live with "Sharyn Lull"  there is a message in the chart that he is not welcome at her house anymore ??     Louis Meckel  07/25/2021, 8:47 AM   Recent Labs  Lab 07/24/21 1953 07/25/21 0340  K 4.6 4.9  BUN 25* 26*  CREATININE 3.16* 3.20*  CALCIUM 9.3 9.2  PHOS 3.5 3.9  HGB 9.0* 9.1*   Inpatient medications:  (feeding supplement) PROSource Plus  30 mL Oral  BID BM   acetaminophen  650 mg Oral Q6H   apixaban  2.5 mg Oral BID   Chlorhexidine Gluconate Cloth  6 each Topical Daily   Chlorhexidine Gluconate Cloth  6 each Topical Q0600   darbepoetin (ARANESP) injection - DIALYSIS  150 mcg Intravenous Q Wed-HD   feeding supplement  237 mL Oral BID BM   fluticasone furoate-vilanterol  1 puff Inhalation Daily   Gerhardt's butt cream   Topical Daily   insulin aspart  0-9 Units Subcutaneous TID WC   mouth rinse  15 mL Mouth Rinse BID   midodrine  10 mg Oral TID   multivitamin  1 tablet Oral QHS   mupirocin ointment   Topical TID   nystatin   Topical BID   pantoprazole  40 mg Oral Daily   sodium chloride flush  3 mL Intravenous Q12H     prismasol BGK 4/2.5 Stopped (07/23/21 1145)    prismasol BGK 4/2.5 Stopped (07/23/21 1145)   sodium chloride     sodium chloride     sodium chloride     dextrose 20 mL/hr at 07/25/21 0400   prismasol BGK 4/2.5 1,750 mL/hr at 07/23/21 1022   sodium chloride, sodium chloride, sodium chloride, alteplase, alteplase, guaiFENesin-dextromethorphan, heparin, heparin, ipratropium-albuterol, lidocaine (PF), lidocaine-prilocaine,  oxyCODONE, pentafluoroprop-tetrafluoroeth, sodium chloride, sodium chloride flush

## 2021-07-25 NOTE — Progress Notes (Signed)
PROGRESS NOTE   Thomas Robinson  ZOX:[id number] DOB: 10/18/1960 DOA: 07/16/2021 PCP: [name], PA  Brief Narrative:  60 year old male home dwelling chronic respiratory failure 2.5 L, HFpEF, ESRD MWF, a flutter CHADS2 score >4 (new diagnosis 07/05/2021) Eliquis secondary hyperparathyroidism HTN HLD Recent AMA admission 07/05/2021, 07/11/2021--at those admission had septic shock with transaminitis right-sided pneumonia with pleural effusions which were transudative on thoracentesis, subsequently had bright red blood per rectum and stopped Eliquis on his own--had EGD colonoscopy 11/10 showing internal hemorrhoids barotrauma no active bleed Readmitted 7/15 with chest pain in the setting of missing dialysis-also found to have balanitis Some confusion about patient wanting hospice but patient decided to have dialysis again and revoked this so needed dialysis access Ultimately had PEA arrest as below and transferred to critical care  11/15 admitted 11/16 tunneled HD line 11/18 planned AVF creation, brief cardiac arrest (~2 min) 11/20 extubated.  11/22 off pressors 11/23 iHD  Hospital-Problem based course  PEA arrest 2 minutes Stabilized at this time-keep on telemetry-see below Discontinue central line Atrial flutter CHADS2 score >4 previously on Eliquis now not because of bleeding Supposed to be seen by Dr.Taub cardiology Will resume metoprolol 12.5 3 times daily and see if we can titrate this may be limited by blood pressure Will ask cardiology to weigh in as may require cardioversion  Eliquis at 2.5 twice daily dosing HFpEF EF 75% near cavity obliteration elevated right pulmonary pressures Probably secondary to underlying lung disease? Resume metoprolol as above ESRD MWF New TDC placed 11/16 Stop Lasix volume control with dialysis Resume Sensipar 60 every afternoon Dialysis is limited by hypotension so continue midodrine 10 3 times daily Reassess aVF second stage per  vascular Thrombocytopenia Unclear if this was HIT versus not however platelets have gone from the 70 range to 112 range-I will get a platelet smear if he drops below 100 again GI bleed on 11/10 secondary to internal hemorrhoids Need cardiology opinion prior to resumption of the same Continue Protonix 40 daily Impaired glucose tolerance--A1c 4.6 on 10/31 Keep per core measures CBGs below 180 on sliding scale   DVT prophylaxis: SCD Code Status: DNR noted on chart Family Communication: None Disposition:  Status is: Inpatient  Remains inpatient appropriate because: Unclear further plans and need for cardiology input    Consultants:  Renal  Procedures: Resuscitation  Antimicrobials: n    Subjective: Sitting up in the bed no distress seems comfortable Underwent almost 4 hours with HD yesterday No pain no fever No chest pain no nausea no vomiting  Objective: Vitals:   07/25/21 0300 07/25/21 0400 07/25/21 0500 07/25/21 0600  BP: 126/83 119/89 129/79   Pulse: (!) 124  (!) 125 (!) (P) 128  Resp: 19 18 19    Temp:      TempSrc:      SpO2: 98%  100% (P) 100%  Weight:      Height:        Intake/Output Summary (Last 24 hours) at 07/25/2021 0725 Last data filed at 07/25/2021 0400 Gross per 24 hour  Intake 958.03 ml  Output 1 ml  Net 957.03 ml   Filed Weights   07/22/21 0500 07/23/21 0500 07/25/21 0250  Weight: 61.1 kg 60.6 kg 60.4 kg    Examination:  EOMI NCAT slightly disheveled no distress Chest clear no added sound Central line in place on left side TDC and left chest S1-S2 tachycardic with a flutter 4:1 Chest clear no added sound no rales no rhonchi no wheeze No lower  extremity edema Neurologically intact moving all 4 limbs Affect is flat  Data Reviewed: personally reviewed   CBC    Component Value Date/Time   WBC 4.6 07/25/2021 0340   RBC 2.72 (L) 07/25/2021 0340   HGB 9.1 (L) 07/25/2021 0340   HCT 29.0 (L) 07/25/2021 0340   HCT 27.3 (L) 05/17/2019  0203   PLT 114 (L) 07/25/2021 0340   MCV 106.6 (H) 07/25/2021 0340   MCH 33.5 07/25/2021 0340   MCHC 31.4 07/25/2021 0340   RDW 20.6 (H) 07/25/2021 0340   LYMPHSABS 0.7 07/25/2021 0340   MONOABS 0.4 07/25/2021 0340   EOSABS 0.1 07/25/2021 0340   BASOSABS 0.1 07/25/2021 0340   CMP Latest Ref Rng & Units 07/25/2021 07/24/2021 07/24/2021  Glucose 70 - 99 mg/dL 87 116(H) 80  BUN 8 - 23 mg/dL 26(H) 25(H) 14  Creatinine 0.61 - 1.24 mg/dL 3.20(H) 3.16(H) 2.32(H)  Sodium 135 - 145 mmol/L 135 135 137  Potassium 3.5 - 5.1 mmol/L 4.9 4.6 4.5  Chloride 98 - 111 mmol/L 103 102 104  CO2 22 - 32 mmol/L 24 25 25   Calcium 8.9 - 10.3 mg/dL 9.2 9.3 9.0  Total Protein 6.5 - 8.1 g/dL - - -  Total Bilirubin 0.3 - 1.2 mg/dL - - -  Alkaline Phos 38 - 126 U/L - - -  AST 15 - 41 U/L - - -  ALT 0 - 44 U/L - - -     Radiology Studies: No results found.   Scheduled Meds:  (feeding supplement) PROSource Plus  30 mL Oral BID BM   acetaminophen  650 mg Oral Q6H   apixaban  2.5 mg Oral BID   Chlorhexidine Gluconate Cloth  6 each Topical Daily   Chlorhexidine Gluconate Cloth  6 each Topical Q0600   darbepoetin (ARANESP) injection - DIALYSIS  150 mcg Intravenous Q Wed-HD   feeding supplement  237 mL Oral BID BM   fluticasone furoate-vilanterol  1 puff Inhalation Daily   Gerhardt's butt cream   Topical Daily   insulin aspart  0-9 Units Subcutaneous TID WC   mouth rinse  15 mL Mouth Rinse BID   midodrine  10 mg Oral TID   multivitamin  1 tablet Oral QHS   mupirocin ointment   Topical TID   nystatin   Topical BID   pantoprazole  40 mg Oral Daily   sodium chloride flush  3 mL Intravenous Q12H   Continuous Infusions:   prismasol BGK 4/2.5 Stopped (07/23/21 1145)    prismasol BGK 4/2.5 Stopped (07/23/21 1145)   sodium chloride     sodium chloride     sodium chloride     dextrose 20 mL/hr at 07/25/21 0400   prismasol BGK 4/2.5 1,750 mL/hr at 07/23/21 1022     LOS: 9 days   Time spent:  [place], MD [facility] To contact the attending provider between 7A-7P or the covering provider during after hours 7P-7A, please log into the web site [email or web address] and access using universal [place] password for that web site. If you do not have the password, please call the hospital operator.  07/25/2021, 7:26 AM

## 2021-07-26 DIAGNOSIS — N186 End stage renal disease: Secondary | ICD-10-CM | POA: Diagnosis not present

## 2021-07-26 DIAGNOSIS — I484 Atypical atrial flutter: Secondary | ICD-10-CM

## 2021-07-26 LAB — CBC WITH DIFFERENTIAL/PLATELET
Abs Immature Granulocytes: 0.02 10*3/uL (ref 0.00–0.07)
Basophils Absolute: 0.1 10*3/uL (ref 0.0–0.1)
Basophils Relative: 1 %
Eosinophils Absolute: 0.1 10*3/uL (ref 0.0–0.5)
Eosinophils Relative: 3 %
HCT: 28.7 % — ABNORMAL LOW (ref 39.0–52.0)
Hemoglobin: 9.1 g/dL — ABNORMAL LOW (ref 13.0–17.0)
Immature Granulocytes: 0 %
Lymphocytes Relative: 19 %
Lymphs Abs: 0.9 10*3/uL (ref 0.7–4.0)
MCH: 33.8 pg (ref 26.0–34.0)
MCHC: 31.7 g/dL (ref 30.0–36.0)
MCV: 106.7 fL — ABNORMAL HIGH (ref 80.0–100.0)
Monocytes Absolute: 0.6 10*3/uL (ref 0.1–1.0)
Monocytes Relative: 12 %
Neutro Abs: 3 10*3/uL (ref 1.7–7.7)
Neutrophils Relative %: 65 %
Platelets: 116 10*3/uL — ABNORMAL LOW (ref 150–400)
RBC: 2.69 MIL/uL — ABNORMAL LOW (ref 4.22–5.81)
RDW: 20.5 % — ABNORMAL HIGH (ref 11.5–15.5)
WBC: 4.6 10*3/uL (ref 4.0–10.5)
nRBC: 0 % (ref 0.0–0.2)

## 2021-07-26 LAB — RENAL FUNCTION PANEL
Albumin: 2 g/dL — ABNORMAL LOW (ref 3.5–5.0)
Anion gap: 6 (ref 5–15)
BUN: 21 mg/dL (ref 8–23)
CO2: 26 mmol/L (ref 22–32)
Calcium: 9 mg/dL (ref 8.9–10.3)
Chloride: 104 mmol/L (ref 98–111)
Creatinine, Ser: 3.31 mg/dL — ABNORMAL HIGH (ref 0.61–1.24)
GFR, Estimated: 20 mL/min — ABNORMAL LOW (ref >60)
Glucose, Bld: 91 mg/dL (ref 70–99)
Phosphorus: 3.7 mg/dL (ref 2.5–4.6)
Potassium: 4.8 mmol/L (ref 3.5–5.1)
Sodium: 136 mmol/L (ref 135–145)

## 2021-07-26 LAB — GLUCOSE, CAPILLARY
Glucose-Capillary: 89 mg/dL (ref 70–99)
Glucose-Capillary: 100 mg/dL — ABNORMAL HIGH (ref 70–99)
Glucose-Capillary: 89 mg/dL (ref 70–99)
Glucose-Capillary: 95 mg/dL (ref 70–99)

## 2021-07-26 MED ORDER — CHLORHEXIDINE GLUCONATE CLOTH 2 % EX PADS: 6.0000 | MEDICATED_PAD | Freq: Every day | CUTANEOUS | Status: DC

## 2021-07-26 NOTE — Progress Notes (Signed)
[facility] Progress Note  Subjective:  Back to his old tricks of threatening to leave AMA because they wanted to check his sugar too many times and not let him sleep  Vitals:   07/26/21 0600 07/26/21 0700 07/26/21 0711 07/26/21 0800  BP:   100/66   Pulse: (!) 45 (!) 132 (!) 134 (!) 127  Resp: (!) 22 (!) 22 (!) 22 (!) 24  Temp:      TempSrc:      SpO2: 100% 93% 100% 96%  Weight:      Height:        Exam:  alert, very weak-    no jvd  Chest cta bilat  Cor reg no RG  Abd soft ntnd no ascites  Ext 1-2+ diffuse bilat LE edema from feet to hips Neuro is alert, Ox 3 , nf, gen deconditioning   RUE AVF (maturing), +bruit     Home meds include - eliquis, breo ellipta, sensipar, lasix 80 non hd days, metoprolol 50 bid, midodrine 10 bid, rena vit, renvela 1-2 ac,         OP HD: from 07/02/21 > MWF AF  4h  400/800   50kg  2/2 bath  Heparin 2000  RUE AVF (maturing)    - 1st stage R basilic vein AVF creation done by Dr Trula Slade on 04/04/21    CXR 11/18 - IMPRESSION: New left IJ central line tip overlies SVC.  No pneumothorax. Persistent patchy bilateral opacities and layering right pleural effusion.   Assessment/ Plan: SP cardiac arrest - on 11/18.  VDRF -  now extubated ESRD - usual HD is MWF.  CRRT 11/18-11/22 due to hemodynamic instability and a lot of volume on board.  HD done 11/23 overnight- tolerated reasonably well it seems He says he is willing to go 4 days a week which we likely will need -  issues with compliance in the past so not sure how sincere this promise is.  While in house planning for HD Fri and Sat -  I will talk to AF about running MWFS BP/volume - w/ LE edema, on max po midodrine - off pressors. will try and maximize UF  with tricks on IHD. He is very tachycardic and we cannot use any agents to lower HR HD access - TDC removed last week for hospice transition. Pt changed his mind and was admitted. We tried to use his AVF but wasn't mature enough. IR  placed new [place] on 11/16. The AVF 1st stage was in August. [facility]VS saw pt and was going to have 2nd stage AVF surgery here but had cardiac arrest prior to the procedure. Per VVS will reassess in 1-2 mos when more stable.  MBD ckd - phos is fine right now no binder- least of his issues-   Anemia ckd - Hb 10's, now has dropped -  have added ESA and trend- iron stores high Dispo-  very weak-  PT rec SNF-  I told him to strongly consider-  he thinks he is going to live with "Sharyn Lull"  there is a message in the chart that he is not welcome at her house anymore ?? I told him that he needed to stop this nonsense of leaving [place]  07/26/2021, 8:40 AM   Recent Labs  Lab 07/25/21 0828 07/25/21 0830 07/26/21 0319  K  --  4.2 4.8  BUN  --  12 21  CREATININE  --  2.36* 3.31*  CALCIUM  --  9.0 9.0  PHOS  --  2.7 3.7  HGB 9.8*  --  9.1*   Inpatient medications:  (feeding supplement) PROSource Plus  30 mL Oral BID BM   acetaminophen  650 mg Oral Q6H   apixaban  2.5 mg Oral BID   Chlorhexidine Gluconate Cloth  6 each Topical Daily   Chlorhexidine Gluconate Cloth  6 each Topical Q0600   darbepoetin (ARANESP) injection - DIALYSIS  150 mcg Intravenous Q Wed-HD   feeding supplement  237 mL Oral BID BM   fluticasone furoate-vilanterol  1 puff Inhalation Daily   Gerhardt's butt cream   Topical Daily   insulin aspart  0-9 Units Subcutaneous TID WC   mouth rinse  15 mL Mouth Rinse BID   metoprolol tartrate  12.5 mg Oral TID   midodrine  10 mg Oral TID   multivitamin  1 tablet Oral QHS   mupirocin ointment   Topical TID   nystatin   Topical BID   pantoprazole  40 mg Oral Daily   sodium chloride flush  3 mL Intravenous Q12H    sodium chloride     sodium chloride     sodium chloride     dextrose 20 mL/hr at 07/26/21 0700   sodium chloride, sodium chloride, sodium chloride, alteplase, alteplase, guaiFENesin-dextromethorphan, heparin, heparin, ipratropium-albuterol, lidocaine  (PF), lidocaine-prilocaine, oxyCODONE, pentafluoroprop-tetrafluoroeth, sodium chloride, sodium chloride flush

## 2021-07-26 NOTE — Progress Notes (Signed)
Physical Therapy Treatment Patient Details Name: Thomas Robinson MRN: [id number] DOB: Feb 26, 1961 Today's Date: 07/26/2021   History of Present Illness Pt adm 11/15 with chest pain and SOB likely due to missed HD. Pt had HD catheter removed week prior to transition to hospice and then changed his mind. Pt had tunneled HD catheter placed 11/16 and HD resumed. On 11/18 pt received block prior to AV graft and had cardiac arrest and intubated. CRRT initiated 11/18 and stopped 11/22. PMH includes HTN, chronic diastolic heart failure , ESRD on HD    PT Comments    Pt making steady progress with mobility. Continue to feel he could benefit from SNF but pt states he will be leaving after his next HD treatment to go back to live with his friend Thomas Robinson. Will continue to work toward maximizing mobility prior to his leaving.   Recommendations for follow up therapy are one component of a multi-disciplinary discharge planning process, led by the attending physician.  Recommendations may be updated based on patient status, additional functional criteria and insurance authorization.  Follow Up Recommendations  Skilled nursing-short term rehab (<3 hours/day) (Pt will likely refuse. Would try HHPT if pt refuses SNF.)     Assistance Recommended at Discharge Frequent or constant Supervision/Assistance  Equipment Recommendations  None recommended by PT    Recommendations for Other Services       Precautions / Restrictions Precautions Precautions: Fall     Mobility  Bed Mobility Overal bed mobility: Needs Assistance Bed Mobility: Supine to Sit;Sit to Supine     Supine to sit: Supervision;HOB elevated Sit to supine: Supervision   General bed mobility comments: for lines only    Transfers Overall transfer level: Needs assistance Equipment used: Rollator (4 wheels) Transfers: Sit to/from Stand Sit to Stand: Min guard           General transfer comment: Assist for safety and lines     Ambulation/Gait Ambulation/Gait assistance: Min guard Gait Distance (Feet): 100 Feet (50' x 1, 100' x 1) Assistive device: Rollator (4 wheels) Gait Pattern/deviations: Step-through pattern;Decreased stride length;Trunk flexed Gait velocity: decr Gait velocity interpretation: <1.31 ft/sec, indicative of household ambulator   General Gait Details: assist for Therapist, music    Modified Rankin (Stroke Patients Only)       Balance Overall balance assessment: Needs assistance Sitting-balance support: No upper extremity supported;Feet supported Sitting balance-Leahy Scale: Good     Standing balance support: Single extremity supported Standing balance-Leahy Scale: Poor Standing balance comment: reliant on UE support                            Cognition Arousal/Alertness: Awake/alert Behavior During Therapy: WFL for tasks assessed/performed Overall Cognitive Status: Within Functional Limits for tasks assessed                                 General Comments: Pt irritable and poor insight into medical issues which I believe is baseline for this patient.        Exercises      General Comments General comments (skin integrity, edema, etc.): HR 130 with mobility      Pertinent Vitals/Pain Pain Assessment: No/denies pain    Home Living  Prior Function            PT Goals (current goals can now be found in the care plan section) Acute Rehab PT Goals Patient Stated Goal: Pt states he is going to leave after next HD Progress towards PT goals: Progressing toward goals    Frequency    Min 3X/week      PT Plan Current plan remains appropriate    Co-evaluation              AM-PAC PT "6 Clicks" Mobility   Outcome Measure  Help needed turning from your back to your side while in a flat bed without using bedrails?: None Help needed moving from  lying on your back to sitting on the side of a flat bed without using bedrails?: A Little Help needed moving to and from a bed to a chair (including a wheelchair)?: A Little Help needed standing up from a chair using your arms (e.g., wheelchair or bedside chair)?: A Little Help needed to walk in hospital room?: A Little Help needed climbing 3-5 steps with a railing? : A Lot 6 Click Score: 18    End of Session   Activity Tolerance: Patient tolerated treatment well Patient left: in bed;with call bell/phone within reach;with bed alarm set   PT Visit Diagnosis: Muscle weakness (generalized) (M62.81);History of falling (Z91.81);Other abnormalities of gait and mobility (R26.89)     Time: 4514-6047 PT Time Calculation (min) (ACUTE ONLY): 29 min  Charges:  $Gait Training: 23-37 mins                     [place] Pager [phone] Office [place] 07/26/2021, 4:52 PM

## 2021-07-26 NOTE — Progress Notes (Signed)
Progress Note  Patient Name: Thomas Robinson Date of Encounter: 07/26/2021  [facility] Cardiologist: Godfrey Pick Tobb, DO   Subjective   No CP or dyspnea; no palpitations  Inpatient Medications    Scheduled Meds:  (feeding supplement) PROSource Plus  30 mL Oral BID BM   acetaminophen  650 mg Oral Q6H   apixaban  2.5 mg Oral BID   Chlorhexidine Gluconate Cloth  6 each Topical Daily   Chlorhexidine Gluconate Cloth  6 each Topical Q0600   Chlorhexidine Gluconate Cloth  6 each Topical Q0600   darbepoetin (ARANESP) injection - DIALYSIS  150 mcg Intravenous Q Wed-HD   feeding supplement  237 mL Oral BID BM   fluticasone furoate-vilanterol  1 puff Inhalation Daily   Gerhardt's butt cream   Topical Daily   insulin aspart  0-9 Units Subcutaneous TID WC   mouth rinse  15 mL Mouth Rinse BID   metoprolol tartrate  12.5 mg Oral TID   midodrine  10 mg Oral TID   multivitamin  1 tablet Oral QHS   mupirocin ointment   Topical TID   nystatin   Topical BID   pantoprazole  40 mg Oral Daily   sodium chloride flush  3 mL Intravenous Q12H   Continuous Infusions:  sodium chloride     sodium chloride     sodium chloride     dextrose 20 mL/hr at 07/26/21 0800   PRN Meds: sodium chloride, sodium chloride, sodium chloride, alteplase, alteplase, guaiFENesin-dextromethorphan, heparin, heparin, ipratropium-albuterol, lidocaine (PF), lidocaine-prilocaine, oxyCODONE, pentafluoroprop-tetrafluoroeth, sodium chloride, sodium chloride flush   Vital Signs    Vitals:   07/26/21 0700 07/26/21 0711 07/26/21 0800 07/26/21 0944  BP:  100/66  101/69  Pulse: (!) 132 (!) 134 (!) 127 (!) 129  Resp: (!) 22 (!) 22 (!) 24   Temp:      TempSrc:      SpO2: 93% 100% 96%   Weight:      Height:        Intake/Output Summary (Last 24 hours) at 07/26/2021 1028 Last data filed at 07/26/2021 0800 Gross per 24 hour  Intake 456.22 ml  Output --  Net 456.22 ml   Last 3 Weights 07/26/2021 07/25/2021 07/25/2021   Weight (lbs) 129 lb 13.6 oz 130 lb 1.1 oz 133 lb 2.5 oz  Weight (kg) 58.9 kg 59 kg 60.4 kg      Telemetry    Atrial flutter with RVR- Personally Reviewed  Physical Exam   GEN: No acute distress.  Chronically  ill appearing Neck: No JVD Cardiac: Tachycardic and regular Respiratory: Clear to auscultation bilaterally. GI: Soft, nontender, non-distended  MS: No edema Neuro:  Nonfocal  Psych: Normal affect   Labs    High Sensitivity Troponin:   Recent Labs  Lab 07/01/21 1329 07/02/21 0420 07/16/21 0922 07/16/21 1150 07/19/21 1100  TROPONINIHS 144* 202* 74* 75* 168*     Chemistry Recent Labs  Lab 07/20/21 0425 07/20/21 1518 07/21/21 0400 07/22/21 0353 07/25/21 0340 07/25/21 0830 07/26/21 0319  NA 134*  134*  134*   < > 134*   < > 135 139 136  K 4.0  4.0  4.0   < > 4.3   < > 4.9 4.2 4.8  CL 102  101  100  --  101   < > 103 103 104  CO2 23  22  22   --  26   < > 24 28 26   GLUCOSE 117*  119*  118*  --  105*   < > 87 102* 91  BUN 28*  29*  28*  --  15   < > 26* 12 21  CREATININE 4.77*  4.68*  4.77*  --  2.31*   < > 3.20* 2.36* 3.31*  CALCIUM 7.7*  7.7*  7.6*  --  7.8*   < > 9.2 9.0 9.0  MG 2.0  --  2.2  --   --   --   --   PROT 4.9*  --  5.0*  --   --   --   --   ALBUMIN 2.0*  2.0*  2.0*  --  1.9*  1.9*   < > 2.1* 2.3* 2.0*  AST 61*  --  27  --   --   --   --   ALT 14  --  13  --   --   --   --   ALKPHOS 119  --  107  --   --   --   --   BILITOT 1.0  --  0.8  --   --   --   --   GFRNONAA 13*  13*  13*  --  31*   < > 21* 30* 20*  ANIONGAP 9  11  12   --  7   < > 8 8 6    < > = values in this interval not displayed.    Lipids  Recent Labs  Lab 07/20/21 0425  TRIG 71    Hematology Recent Labs  Lab 07/25/21 0340 07/25/21 0828 07/26/21 0319  WBC 4.6 4.7 4.6  RBC 2.72* 2.94* 2.69*  HGB 9.1* 9.8* 9.1*  HCT 29.0* 30.8* 28.7*  MCV 106.6* 104.8* 106.7*  MCH 33.5 33.3 33.8  MCHC 31.4 31.8 31.7  RDW 20.6* 20.1* 20.5*  PLT 114* 111* 116*     Patient Profile     60 y.o. male with past medical history of chronic hypoxic respiratory failure, chronic right heart failure/severe pulmonary hypertension/cor pulmonale, end-stage renal disease dialysis dependent, hyperlipidemia, noncompliance, hypertension, aortic stenosis being evaluated for atrial flutter.  Patient had a recent PEA arrest after receiving low-dose sedation.  Echocardiogram July 19, 2021 showed cavity obliteration with ejection fraction greater than 75%, severe RV dysfunction, severe pulmonary hypertension, small pericardial fusion, moderate to severe tricuspid regurgitation.  Assessment & Plan    1 severe pulmonary hypertension/cor pulmonale-this is felt secondary to severe COPD.  He has had previous evaluation including VQ scan that did not show chronic thrombotic pulmonary hypertension.  Previous heart catheterization at [facility] showed normal LVEDP.  Per Dr. Kathalene Frames note who is familiar with patient hospice care should be considered.  Note patient suffered a cardiac arrest with light sedation at time of previous AV fistula revision surgery.  2 atrial flutter-heart rate remains elevated.  Based on the above I do not believe he is a candidate for TEE guided cardioversion.  To complicate matters he is also noncompliant with anticoagulation and therefore risk of embolic event would be even higher.  We will try and control heart rate.  We will change metoprolol to 25 mg twice daily.  Blood pressure will be limiting factor.  May need to consider amiodarone despite COPD as this would likely be the only alternative.  Continue apixaban.  3 history of noncompliance-previously not taking apixaban.  Also has left AGAINST MEDICAL ADVICE in the past.  4 end-stage renal disease-dialysis per nephrology.  Prognosis appears to be poor.  For questions or  updates, please contact [place] Please consult [email or web address] for contact info under        Signed, Kirk Ruths, MD  07/26/2021, 10:28 AM

## 2021-07-26 NOTE — Progress Notes (Signed)
PROGRESS NOTE   Thomas Robinson  MLY:[id number] DOB: 23-Nov-1960 DOA: 07/16/2021 PCP: [name], PA  Brief Narrative:  60 year old male home dwelling chronic respiratory failure 2.5 L, HFpEF, ESRD MWF, a flutter CHADS2 score >4 (new diagnosis 07/05/2021) Eliquis secondary hyperparathyroidism HTN HLD Recent AMA admission 07/05/2021, 07/11/2021--at those admission had septic shock with transaminitis right-sided pneumonia with pleural effusions which were transudative on thoracentesis, subsequently had bright red blood per rectum and stopped Eliquis on his own--had EGD colonoscopy 11/10 showing internal hemorrhoids barotrauma no active bleed Readmitted 7/15 with chest pain in the setting of missing dialysis-also found to have balanitis Some confusion about patient wanting hospice but patient decided to have dialysis again and revoked this so needed dialysis access Ultimately had PEA arrest as below and transferred to critical care  11/15 admitted 11/16 tunneled HD line 11/18 planned AVF creation, brief cardiac arrest (~2 min) 11/20 extubated.  11/22 off pressors 11/23 iHD  Hospital-Problem based course  PEA arrest 2 minutes Stabilized at this time-keep on telemetry-see below Discontinue central line Atrial flutter CHADS2 score >4 previously on Eliquis now not because of bleeding Supposed to be seen by Dr.Taub cardiology Will resume metoprolol 12.5 3 times daily and see if we can titrate--limited by blood pressure Cardiology feels he is end stage and recommends Hospice--he is at very high risk for any operative procedures Eliquis at 2.5 twice daily dosing HFpEF EF 75% near cavity obliteration elevated right pulmonary pressures Probably secondary to underlying lung disease? Resume metoprolol as above ESRD MWF New TDC placed 11/16 Stop Lasix- volume control with dialysis Resume Sensipar 60 every afternoon Dialysis is limited by hypotension so continue midodrine 10 3 times  daily Reassess aVF second stage per vascular Thrombocytopenia platelets have gone from the 70 range to 116--no fruther work-up--resoled and liekly 2/2 to arrest? GI bleed on 11/10 secondary to internal hemorrhoids Continue Protonix 40 daily Impaired glucose tolerance--A1c 4.6 on 10/31 As sugars have been consistently below 180, stop checking   DVT prophylaxis: SCD Code Status: DNR noted on chart Family Communication: None Disposition:  Status is: Inpatient  Remains inpatient appropriate because: Unclear further plans and need for cardiology input    Consultants:  Renal  Procedures: Resuscitation  Antimicrobials: n    Subjective:  Threatening to leave AMA Nephrologist at bedside and encouraged patient to think through things--patient also has nowhere to go  Objective: Vitals:   07/26/21 0944 07/26/21 1100 07/26/21 1125 07/26/21 1200  BP: 101/69   114/76  Pulse: (!) 129 (!) 131  (!) 127  Resp:  (!) 24  (!) 23  Temp:   98.3 F (36.8 C)   TempSrc:   Oral   SpO2:  95%  (!) 74%  Weight:      Height:        Intake/Output Summary (Last 24 hours) at 07/26/2021 1316 Last data filed at 07/26/2021 1200 Gross per 24 hour  Intake 643.18 ml  Output --  Net 643.18 ml    Filed Weights   07/25/21 0250 07/25/21 0615 07/26/21 0457  Weight: 60.4 kg 59 kg 58.9 kg    Examination:  disheveled no distress irritable Chest clear no added sound TDC and left chest S1-S2 tachycardic with a flutter 4:1--remains uncontrolled in 100-115 range Chest clear no added sound no rales no rhonchi no wheeze No lower extremity edema Neurologically intact moving all 4 limbs Affect is flat  Data Reviewed: personally reviewed   CBC    Component Value Date/Time   WBC  4.6 07/26/2021 0319   RBC 2.69 (L) 07/26/2021 0319   HGB 9.1 (L) 07/26/2021 0319   HCT 28.7 (L) 07/26/2021 0319   HCT 27.3 (L) 05/17/2019 0203   PLT 116 (L) 07/26/2021 0319   MCV 106.7 (H) 07/26/2021 0319   MCH 33.8  07/26/2021 0319   MCHC 31.7 07/26/2021 0319   RDW 20.5 (H) 07/26/2021 0319   LYMPHSABS 0.9 07/26/2021 0319   MONOABS 0.6 07/26/2021 0319   EOSABS 0.1 07/26/2021 0319   BASOSABS 0.1 07/26/2021 0319   CMP Latest Ref Rng & Units 07/26/2021 07/25/2021 07/25/2021  Glucose 70 - 99 mg/dL 91 102(H) 87  BUN 8 - 23 mg/dL 21 12 26(H)  Creatinine 0.61 - 1.24 mg/dL 3.31(H) 2.36(H) 3.20(H)  Sodium 135 - 145 mmol/L 136 139 135  Potassium 3.5 - 5.1 mmol/L 4.8 4.2 4.9  Chloride 98 - 111 mmol/L 104 103 103  CO2 22 - 32 mmol/L 26 28 24   Calcium 8.9 - 10.3 mg/dL 9.0 9.0 9.2  Total Protein 6.5 - 8.1 g/dL - - -  Total Bilirubin 0.3 - 1.2 mg/dL - - -  Alkaline Phos 38 - 126 U/L - - -  AST 15 - 41 U/L - - -  ALT 0 - 44 U/L - - -     Radiology Studies: No results found.   Scheduled Meds:  (feeding supplement) PROSource Plus  30 mL Oral BID BM   acetaminophen  650 mg Oral Q6H   apixaban  2.5 mg Oral BID   Chlorhexidine Gluconate Cloth  6 each Topical Daily   Chlorhexidine Gluconate Cloth  6 each Topical Q0600   Chlorhexidine Gluconate Cloth  6 each Topical Q0600   darbepoetin (ARANESP) injection - DIALYSIS  150 mcg Intravenous Q Wed-HD   feeding supplement  237 mL Oral BID BM   fluticasone furoate-vilanterol  1 puff Inhalation Daily   Gerhardt's butt cream   Topical Daily   insulin aspart  0-9 Units Subcutaneous TID WC   mouth rinse  15 mL Mouth Rinse BID   metoprolol tartrate  12.5 mg Oral TID   midodrine  10 mg Oral TID   multivitamin  1 tablet Oral QHS   mupirocin ointment   Topical TID   nystatin   Topical BID   pantoprazole  40 mg Oral Daily   sodium chloride flush  3 mL Intravenous Q12H   Continuous Infusions:  sodium chloride     sodium chloride     sodium chloride     dextrose 20 mL/hr at 07/26/21 1200     LOS: 10 days   Time spent: [place], MD [facility] To contact the attending provider between 7A-7P or the covering provider during after hours  7P-7A, please log into the web site [email or web address] and access using universal [place] password for that web site. If you do not have the password, please call the hospital operator.  07/26/2021, 1:16 PM

## 2021-07-26 NOTE — Progress Notes (Signed)
This chaplain is present for F/U spiritual care as requested by the Pt. The chaplain sits down today and listens reflectively to the Pt. story of the morning. The chaplain understands the Pt. is cognizant of the medical team's reaction to his statement of wanting to leave AMA.  After asking the Pt.what he hoped to gain from leaving AMA, the chaplain understands the Pt. wants more "control" of his environment and care. The chaplain offered to the Pt. with no response from the Pt., "the medical care you are receiving reflects your care needs." The Pt. lists acupuncture, massage, exercise, diet in his list of self care.   Currently the chaplain understands the Pt. is using the word Hospice in a negative context associated with dying. With the PMT input, the chaplain will attempt Hospice education with the Pt. as a place to "control" his decisions with his desires for quality of life.  Prayer was shared with the Pt. today.  Chaplain Sallyanne Kuster [phone]

## 2021-07-27 DIAGNOSIS — N186 End stage renal disease: Secondary | ICD-10-CM | POA: Diagnosis not present

## 2021-07-27 LAB — CBC WITH DIFFERENTIAL/PLATELET
Abs Immature Granulocytes: 0.02 10*3/uL (ref 0.00–0.07)
Basophils Absolute: 0.1 10*3/uL (ref 0.0–0.1)
Basophils Relative: 1 %
Eosinophils Absolute: 0.2 10*3/uL (ref 0.0–0.5)
Eosinophils Relative: 4 %
HCT: 30.9 % — ABNORMAL LOW (ref 39.0–52.0)
Hemoglobin: 9.3 g/dL — ABNORMAL LOW (ref 13.0–17.0)
Immature Granulocytes: 1 %
Lymphocytes Relative: 18 %
Lymphs Abs: 0.7 10*3/uL (ref 0.7–4.0)
MCH: 32.7 pg (ref 26.0–34.0)
MCHC: 30.1 g/dL (ref 30.0–36.0)
MCV: 108.8 fL — ABNORMAL HIGH (ref 80.0–100.0)
Monocytes Absolute: 0.5 10*3/uL (ref 0.1–1.0)
Monocytes Relative: 12 %
Neutro Abs: 2.6 10*3/uL (ref 1.7–7.7)
Neutrophils Relative %: 64 %
Platelets: 128 10*3/uL — ABNORMAL LOW (ref 150–400)
RBC: 2.84 MIL/uL — ABNORMAL LOW (ref 4.22–5.81)
RDW: 20.1 % — ABNORMAL HIGH (ref 11.5–15.5)
WBC: 4 10*3/uL (ref 4.0–10.5)
nRBC: 0 % (ref 0.0–0.2)

## 2021-07-27 MED ORDER — HEPARIN SODIUM (PORCINE) 1000 UNIT/ML IJ SOLN
INTRAMUSCULAR | Status: AC
  Filled 2021-07-27: qty 1

## 2021-07-27 NOTE — Progress Notes (Addendum)
At approximately 07:38am pt was found on the floor in his room. This is after the pt signed an AMA form and was getting dressed. No injuries were sustained and pt did not hit his head. Vital signs are stable and within defined limits with the exception of Atrial Fibrillation. Dr. Verlon Au was notified at 07:49, and will visit pt in room shortly.

## 2021-07-27 NOTE — Care Plan (Addendum)
Patient has been refusing care including, blood pressure cuff monitoring, CHG bath, oral temp's and medicine. MD has been notified.   2479 - patient refused morning lab draws

## 2021-07-27 NOTE — Progress Notes (Signed)
[facility] Progress Note  Subjective:  HD just finished this AM due to high HD census-  removed 2 liters -  pt is signing [place] form but cannot really get out of bed so I suspect he will fail  Vitals:   07/27/21 0459 07/27/21 0500 07/27/21 0600 07/27/21 0700  BP:  (!) 101/53 100/62 (!) 112/53  Pulse:  (!) 59 85 67  Resp:  (!) 21 (!) 23 (!) 25  Temp:      TempSrc:      SpO2:  100% 94% 98%  Weight: 58.1 kg     Height:        Exam:  alert, very weak-    no jvd  Chest cta bilat  Cor reg no RG  Abd soft ntnd no ascites  Ext 1-2+ diffuse bilat LE edema from feet to hips Neuro is alert, Ox 3 , nf, gen deconditioning   RUE AVF (maturing), +bruit     Home meds include - eliquis, breo ellipta, sensipar, lasix 80 non hd days, metoprolol 50 bid, midodrine 10 bid, rena vit, renvela 1-2 ac,         OP HD: from 07/02/21 > MWF AF  4h  400/800   50kg  2/2 bath  Heparin 2000  RUE AVF (maturing)    - 1st stage R basilic vein AVF creation done by Dr Trula Slade on 04/04/21    CXR 11/18 - IMPRESSION: New left IJ central line tip overlies SVC.  No pneumothorax. Persistent patchy bilateral opacities and layering right pleural effusion.   Assessment/ Plan: SP cardiac arrest - on 11/18.  VDRF -  now extubated ESRD - usual HD is MWF.  CRRT 11/18-11/22 due to hemodynamic instability and a lot of volume on board.  HD done 11/23 overnight- tolerated reasonably well it seems-  done again 11/26 He says he is willing to go 4 days a week which we likely will need -  issues with compliance in the past so not sure how sincere this promise is.  While in house planning for HD Fri and Sat -  were unable to get until early this AM.   I will talk to AF about running MWFS-  I dont think he has the strength to leave AMA so plan for him to stay here and we will do next HD on Monday  BP/volume - w/ LE edema, on max po midodrine - off pressors. Trying to maximize UF  with tricks on IHD. He is very tachycardic  and we cannot use any agents to lower HR-  slower this AM for unclear reasons HD access - TDC removed last week for hospice transition. Pt changed his mind and was admitted. We tried to use his AVF but wasn't mature enough. IR placed new [place] on 11/16. The AVF 1st stage was in August. [facility]VS saw pt and was going to have 2nd stage AVF surgery here but had cardiac arrest prior to the procedure. Per [facility]VS will reassess in 1-2 mos when more stable.  MBD ckd - phos is fine right now no binder- least of his issues-   Anemia ckd - Hb 10's, now has dropped -  have added ESA and trend- iron stores high Dispo-  very weak-  PT rec SNF-  I told him to strongly consider-  he thinks he is going to live with "Sharyn Lull"  there is a message in the chart that he is not welcome at her house anymore ?? I told him  that he needed to stop this nonsense of leaving [place]  07/27/2021, 7:11 AM   Recent Labs  Lab 07/25/21 0830 07/26/21 0319 07/27/21 0606  K 4.2 4.8  --   BUN 12 21  --   CREATININE 2.36* 3.31*  --   CALCIUM 9.0 9.0  --   PHOS 2.7 3.7  --   HGB  --  9.1* 9.3*   Inpatient medications:  (feeding supplement) PROSource Plus  30 mL Oral BID BM   acetaminophen  650 mg Oral Q6H   apixaban  2.5 mg Oral BID   Chlorhexidine Gluconate Cloth  6 each Topical Daily   darbepoetin (ARANESP) injection - DIALYSIS  150 mcg Intravenous Q Wed-HD   feeding supplement  237 mL Oral BID BM   fluticasone furoate-vilanterol  1 puff Inhalation Daily   Gerhardt's butt cream   Topical Daily   heparin sodium (porcine)       mouth rinse  15 mL Mouth Rinse BID   metoprolol tartrate  12.5 mg Oral TID   midodrine  10 mg Oral TID   multivitamin  1 tablet Oral QHS   mupirocin ointment   Topical TID   nystatin   Topical BID   pantoprazole  40 mg Oral Daily   sodium chloride flush  3 mL Intravenous Q12H    sodium chloride     sodium chloride     sodium chloride     dextrose 20 mL/hr at 07/27/21 0700    sodium chloride, sodium chloride, sodium chloride, alteplase, alteplase, guaiFENesin-dextromethorphan, heparin, heparin, ipratropium-albuterol, lidocaine (PF), lidocaine-prilocaine, oxyCODONE, pentafluoroprop-tetrafluoroeth, sodium chloride, sodium chloride flush

## 2021-07-27 NOTE — Progress Notes (Signed)
Vital signs WDL with the exception of Atrial Fibrillation. Pt unable to walk on their own for long distances, will assist pt with wheelchair to front entrance where Pt. states that his friend "Thomas Robinson" will come pick him up. Pt. Refuses to share contact information with [place] about his friend/ride in order to confirm their arrival time.

## 2021-07-27 NOTE — Progress Notes (Signed)
Pt. thoroughly educated and advised against leaving in his condition by myself, Dr. Verlon Au, and Dr. Blanchard Mane. Pt. Signed AMA form at 07:17 am.

## 2021-07-27 NOTE — Discharge Summary (Signed)
Physician Discharge Summary  Thomas Robinson WUJ:[id number] DOB: 1961-08-27 DOA: 07/16/2021  PCP: [name], PA  Admit date: 07/16/2021 Discharge date: 07/27/2021  Time spent: 47 minutes  Recommendations for Outpatient Follow-up:  Patient high risk for readmission as left AMA and unstable state  Discharge Diagnoses:  MAIN problem for hospitalization   Cardiac arrest with PEA arrest Uncontrolled a flutter  Please see below for itemized issues addressed in [name]- refer to other progress notes for clarity if needed  Discharge Condition: Very guarded  Diet recommendation: Renal   Filed Weights   07/25/21 0615 07/26/21 0457 07/27/21 0459  Weight: 59 kg 58.9 kg 58.1 kg    History of present illness:  60 year old male home dwelling chronic respiratory failure 2.5 L, HFpEF, ESRD MWF, a flutter CHADS2 score >4 (new diagnosis 07/05/2021) Eliquis secondary hyperparathyroidism HTN HLD Recent AMA admission 07/05/2021, 07/11/2021--at those admission had septic shock with transaminitis right-sided pneumonia with pleural effusions which were transudative on thoracentesis, subsequently had bright red blood per rectum and stopped Eliquis on his own--had EGD colonoscopy 11/10 showing internal hemorrhoids barotrauma no active bleed Readmitted 7/15 with chest pain in the setting of missing dialysis-also found to have balanitis It appears patient had elected to have  hospice but patient change his mind decided to have dialysis again and revoked this so needed dialysis access which was attempted as below-in the process of placing AV F patient had brief cardiac arrest for 2 minutes Ultimately had PEA arrest as below and transferred to critical care   11/15 admitted 11/16 tunneled HD line 11/18 planned AVF creation, brief cardiac arrest (~2 min) 11/20 extubated.  11/22 off pressors 11/23 [name] Course:  Fall prior to discharge Patient tried to ambulate prior to leaving [name] and fell down-I examined him thoroughly and he did not have any overt wound pain or restriction of limb movement or any injury and was mentating well PEA arrest 2 minutes Stabilized at this time-keep on telemetry-see below Discontinue central line on 11/25 Atrial flutter CHADS2 score >4 previously on Eliquis now not because of bleeding Supposed to be seen by Dr.Taub cardiology  cardiology was consulted during hospital stay and recommended only hospice as very difficult to control heart rate rhythm --patient still elects to have full care because the patient left AGAINST MEDICAL ADVICE He was not given any medication HFpEF EF 75% near cavity obliteration elevated right pulmonary pressures Probably secondary to underlying lung disease? Will need metoprolol in the outpatient setting ESRD MWF New TDC placed 11/16 Stop Lasix- volume control with dialysis Sensipar and midodrine have been recommended for the patient but he left AGAINST MEDICAL ADVICE hence no prescriptions were recommended Reassess aVF second stage per vascular although he is high risk Nephrology has reserved a spot for him however in the outpatient setting should he be able to get [name] on Monday for dialysis Thrombocytopenia platelets have gone from the 70 range to 116--no fruther work-up--resolved and liekly 2/2 to arrest? GI bleed on 11/10 secondary to internal hemorrhoids Will need to obtain Protonix in the outpatient setting Impaired glucose tolerance--A1c 4.6 on 10/31 As sugars have been consistently below 180, stop checking   Please see separate nursing documentation-patient has been threatening to leave [name] for the past 2 to 3 days and family elected on 11/26 to leave-it was recommended to the patient to stay in the hospital for further stability as he does not have a safe place to discharge to and  it looks like who he was staying with before (?  His 6 grade teacher) and his prior  living arrangements were not finalized--- I asked him about this and he said "I have been living there for 15 years he cannot kick me out" I asked him if he knew this was a factor and if he had been in touch with family patient was somewhat evasive  I have recommended strongly that he stay in the hospital for further care and placement as he is high risk for death and dying and for instability in the outpatient setting.  He elected to leave the hospital and signed [name] papers understanding fully well aware of [name] of any responsibility should something happen to him   Discharge Exam: Vitals:   07/27/21 0745 07/27/21 0800  BP: 104/60 (!) 109/53  Pulse:    Resp: 14 (!) 23  Temp:    SpO2:      Subj on day of d/c   Patient adamant about leaving-no significant pain from fall but seems unsteady He has no other specific complaints  General Exam on discharge  EOMI NCAT no focal deficit head does not have any injury Chest is clear no rales no rhonchi S1-S2 no murmur tachycardic Abdomen soft no rebound Range of motion intact upper and lower extremities Abdomen soft   Discharge Instructions    No meds were prescribed and no instructions were given as patient left AGAINST MEDICAL ADVICE Allergies  Allergen Reactions   Vancomycin Shortness Of Breath and Itching    Follow-up Information     [name] Follow up.   Why: Schedule is Monday, Wednesday, Friday Arrive at 12:15 for 12:30 chair time.                 The results of significant diagnostics from this hospitalization (including imaging, microbiology, ancillary and laboratory) are listed below for reference.    Significant Diagnostic Studies: DG Chest 2 View  Result Date: 07/16/2021 CLINICAL DATA:  Chest pain EXAM: CHEST - 2 VIEW COMPARISON:  07/07/2021 FINDINGS: Interval removal of hemodialysis catheter. Stable cardiomegaly. Aortic atherosclerosis. Pulmonary vascular  congestion with predominantly interstitial opacities throughout both lungs. Small right pleural effusion. No pneumothorax. IMPRESSION: Findings suggestive of CHF with pulmonary edema and small right pleural effusion. Electronically Signed   By: Davina Poke D.O.   On: 07/16/2021 09:55   DG Chest 2 View  Result Date: 06/29/2021 CLINICAL DATA:  Shortness of breath.  Left-sided chest pain.  Cough EXAM: CHEST - 2 VIEW COMPARISON:  Radiograph 05/31/2019 FINDINGS: Right-sided dialysis catheter remains in place. Cardiomegaly again seen. Increase in bilateral pleural effusions, right greater than left. Increased fluid in the right minor fissure. Is peribronchial and interstitial thickening likely represents pulmonary edema. There is no pneumothorax. IMPRESSION: 1. Pulmonary edema and pleural effusions, increased since prior exam. Findings suggest CHF. 2. Cardiomegaly is unchanged. Electronically Signed   By: Keith Rake M.D.   On: 06/29/2021 22:55   DG Abd 1 View  Result Date: 07/20/2021 CLINICAL DATA:  Enteric catheter placement EXAM: ABDOMEN - 1 VIEW COMPARISON:  07/01/2021, 07/19/2021 FINDINGS: Frontal view of the lower chest and upper abdomen demonstrates enteric catheter tip and side port projecting over the gastric fundus. External defibrillator pad, left internal jugular catheter, and right internal jugular dialysis catheter are stable. Visualized bowel gas pattern is unremarkable. Bibasilar consolidation and effusions are stable. IMPRESSION: 1. Enteric catheter tip projecting over the gastric fundus. 2. Persistent bibasilar consolidation  and effusions. Electronically Signed   By: Randa Ngo M.D.   On: 07/20/2021 20:17   DG Abd 1 View  Result Date: 07/01/2021 CLINICAL DATA:  Vomiting EXAM: ABDOMEN - 1 VIEW COMPARISON:  None. FINDINGS: Bowel-gas pattern appears within normal limits. No abnormally distended gas-filled loops of bowel to suggest obstruction. No suspicious calcifications  identified. IMPRESSION: No acute process identified. Electronically Signed   By: Ofilia Neas M.D.   On: 07/01/2021 12:58   CT HEAD WO CONTRAST (5MM)  Result Date: 07/01/2021 CLINICAL DATA:  Seizure, abnormal neuro exam. EXAM: CT HEAD WITHOUT CONTRAST TECHNIQUE: Contiguous axial images were obtained from the base of the skull through the vertex without intravenous contrast. COMPARISON:  CT head 05/30/2021.  MRI brain 04/09/2015. FINDINGS: Brain: There has been recent contrast administration. No evidence of acute infarction, hemorrhage, hydrocephalus, extra-axial collection or mass lesion/mass effect. Again seen is mild diffuse atrophy and mild periventricular white matter hypodensity, likely chronic small vessel ischemic change. Vascular: Atherosclerotic calcifications are present within the cavernous internal carotid arteries. Skull: Normal. Negative for fracture or focal lesion. Sinuses/Orbits: No acute finding. Other: None. IMPRESSION: No acute intracranial abnormality. Electronically Signed   By: Ronney Asters M.D.   On: 07/01/2021 22:17   CT Angio Chest PE W/Cm &/Or Wo Cm  Result Date: 06/30/2021 CLINICAL DATA:  Chest pain or SOB, pleurisy or effusion suspected. Chest pain, dyspnea, cough. Hypoxia. EXAM: CT ANGIOGRAPHY CHEST WITH CONTRAST TECHNIQUE: Multidetector CT imaging of the chest was performed using the standard protocol during bolus administration of intravenous contrast. Multiplanar CT image reconstructions and MIPs were obtained to evaluate the vascular anatomy. CONTRAST:  15m OMNIPAQUE IOHEXOL 350 MG/ML SOLN COMPARISON:  None. FINDINGS: Cardiovascular: Adequate opacification of the pulmonary arterial tree. No intraluminal filling defect identified to suggest acute pulmonary embolism. Central pulmonary arteries are of normal caliber. Extensive multi-vessel coronary artery calcification. Mild global cardiomegaly with left ventricular hypertrophy noted. Small pericardial effusion  present. Mild atherosclerotic calcification within the thoracic aorta. The thoracic aorta is dilated in its ascending segment measuring 4.2 cm in greatest dimension. The descending thoracic aorta is of normal caliber. Right internal jugular hemodialysis catheter tip noted within the superior cavoatrial junction. Mediastinum/Nodes: Visualized thyroid is unremarkable. No pathologic thoracic adenopathy. Esophagus is unremarkable. Lungs/Pleura: Moderate right and trace left pleural effusions are present. There is diffuse ground-glass pulmonary infiltrate and smooth interlobular septal thickening in keeping with probable diffuse alveolar and pulmonary interstitial edema. Widespread pneumonic or inflammatory infiltrate is possible but considered less likely. There is, however, collapse and consolidation of the right lower lobe. There is a superimposed rounded region of hypoenhancement within the a right lower lobe, best seen on image # 112/4, suspicious for a a region of parenchymal necrosis in the setting of necrotizing pneumonia or, less likely, a acute to subacute pulmonary infarct. This measures 4.0 x 5.0 cm on axial image # 112/4. no pneumothorax. No central obstructing lesion peer Upper Abdomen: Marked atrophy of the visualized left kidney in keeping with changes of chronic renal insufficiency. No acute abnormality. Musculoskeletal: No acute bone abnormality. No lytic or blastic bone lesion. Review of the MIP images confirms the above findings. IMPRESSION: Collapse and consolidation of the right lower lobe in keeping with changes of lobar pneumonia, with superimposed area of hypoenhancement suspicious for an area of parenchymal necrosis. Less likely, this may represent a acute to subacute infarct. No pulmonary embolism, however, is identified. Superimposed diffuse ground-glass pulmonary infiltrate most suggestive of diffuse alveolar pulmonary edema. Moderate  right pleural effusion is nonspecific and may relate to  volume overload or represent a parapneumonic effusion. Mild cardiomegaly with left ventricular hypertrophy. Extensive coronary artery calcification. Small pericardial effusion. Dilation of the ascending aorta with maximal dimension of 4.2 cm. Recommend annual imaging followup by CTA or MRA. This recommendation follows 2010 ACCF/AHA/AATS/ACR/ASA/SCA/SCAI/SIR/STS/SVM Guidelines for the Diagnosis and Management of Patients with Thoracic Aortic Disease. Circulation. 2010; 121: N397-Q734. Aortic aneurysm NOS (ICD10-I71.9) Electronically Signed   By: Fidela Salisbury M.D.   On: 06/30/2021 01:36   NM Pulmonary Perfusion  Result Date: 07/08/2021 CLINICAL DATA:  Chest pain, shortness of breath, history of blood clots EXAM: NUCLEAR MEDICINE PERFUSION LUNG SCAN TECHNIQUE: Perfusion images were obtained in multiple projections after intravenous injection of radiopharmaceutical. Ventilation scans intentionally deferred if perfusion scan and chest x-ray adequate for interpretation during COVID 19 epidemic. RADIOPHARMACEUTICALS:  3.91 mCi Tc-33mMAA IV COMPARISON:  Chest radiograph dated 07/07/2021. CTA chest dated 06/30/2021. FINDINGS: Normal perfusion, without wedge-shaped perfusion defects to suggest pulmonary embolism. Corresponding chest radiograph demonstrates patchy opacities in the right mid and lower lungs. IMPRESSION: Negative for pulmonary embolism. Electronically Signed   By: SJulian HyM.D.   On: 07/08/2021 18:23   IR Fluoro Guide CV Line Right  Result Date: 07/17/2021 INDICATION: ESRD requiring HD. EXAM: TUNNELED CENTRAL VENOUS HEMODIALYSIS CATHETER PLACEMENT WITH ULTRASOUND AND FLUOROSCOPIC GUIDANCE MEDICATIONS: Vancomycin 1 gm IV . The antibiotic was given in an appropriate time interval prior to skin puncture. ANESTHESIA/SEDATION: Moderate (conscious) sedation was employed during this procedure. A total of Versed 1 mg and Fentanyl 25 mcg was administered intravenously. Moderate Sedation Time: 15  minutes. The patient's level of consciousness and vital signs were monitored continuously by radiology nursing throughout the procedure under my direct supervision. FLUOROSCOPY TIME:  0 minutes 6 seconds (1 mGy). COMPLICATIONS: None immediate. PROCEDURE: Informed written consent was obtained from the patient after a discussion of the risks, benefits, and alternatives to treatment. Questions regarding the procedure were encouraged and answered. The RIGHT neck and chest were prepped with chlorhexidine in a sterile fashion, and a sterile drape was applied covering the operative field. Maximum barrier sterile technique with sterile gowns and gloves were used for the procedure. A timeout was performed prior to the initiation of the procedure. After creating a small venotomy incision, a micropuncture kit was utilized to access the internal jugular vein. Real-time ultrasound guidance was utilized for vascular access including the acquisition of a permanent ultrasound image documenting patency of the accessed vessel. The microwire was utilized to measure appropriate catheter length. A stiff Glidewire was advanced to the level of the IVC and the micropuncture sheath was exchanged for a peel-away sheath. A hemosplit tunneled hemodialysis catheter measuring 23 cm from tip to cuff was tunneled in a retrograde fashion from the anterior chest wall to the venotomy incision. The catheter was then placed through the peel-away sheath with tips ultimately positioned within the superior aspect of the right atrium. Final catheter positioning was confirmed and documented with a spot radiographic image. The catheter aspirates and flushes normally. The catheter was flushed with appropriate volume heparin dwells. The catheter exit site was secured with a 0-silk retention suture. The venotomy incision was closed with an interrupted 2-0 Vicryl, then Dermabond was applied at the skin. Dressings were applied. The patient tolerated the procedure  well without immediate post procedural complication. IMPRESSION: Successful placement of 23 cm tunneled hemodialysis catheter via the RIGHT internal jugular vein with tip terminating within the proximal right atrium. The  catheter is ready for immediate use. Michaelle Birks, MD [facility] Radiology Electronically Signed   By: Michaelle Birks M.D.   On: 07/17/2021 17:59   IR US Guide Vasc Access Right  Result Date: 07/17/2021 INDICATION: ESRD requiring HD. EXAM: TUNNELED CENTRAL VENOUS HEMODIALYSIS CATHETER PLACEMENT WITH ULTRASOUND AND FLUOROSCOPIC GUIDANCE MEDICATIONS: Vancomycin 1 gm IV . The antibiotic was given in an appropriate time interval prior to skin puncture. ANESTHESIA/SEDATION: Moderate (conscious) sedation was employed during this procedure. A total of Versed 1 mg and Fentanyl 25 mcg was administered intravenously. Moderate Sedation Time: 15 minutes. The patient's level of consciousness and vital signs were monitored continuously by radiology nursing throughout the procedure under my direct supervision. FLUOROSCOPY TIME:  0 minutes 6 seconds (1 mGy). COMPLICATIONS: None immediate. PROCEDURE: Informed written consent was obtained from the patient after a discussion of the risks, benefits, and alternatives to treatment. Questions regarding the procedure were encouraged and answered. The RIGHT neck and chest were prepped with chlorhexidine in a sterile fashion, and a sterile drape was applied covering the operative field. Maximum barrier sterile technique with sterile gowns and gloves were used for the procedure. A timeout was performed prior to the initiation of the procedure. After creating a small venotomy incision, a micropuncture kit was utilized to access the internal jugular vein. Real-time ultrasound guidance was utilized for vascular access including the acquisition of a permanent ultrasound image documenting patency of the accessed vessel. The  microwire was utilized to measure appropriate catheter length. A stiff Glidewire was advanced to the level of the IVC and the micropuncture sheath was exchanged for a peel-away sheath. A hemosplit tunneled hemodialysis catheter measuring 23 cm from tip to cuff was tunneled in a retrograde fashion from the anterior chest wall to the venotomy incision. The catheter was then placed through the peel-away sheath with tips ultimately positioned within the superior aspect of the right atrium. Final catheter positioning was confirmed and documented with a spot radiographic image. The catheter aspirates and flushes normally. The catheter was flushed with appropriate volume heparin dwells. The catheter exit site was secured with a 0-silk retention suture. The venotomy incision was closed with an interrupted 2-0 Vicryl, then Dermabond was applied at the skin. Dressings were applied. The patient tolerated the procedure well without immediate post procedural complication. IMPRESSION: Successful placement of 23 cm tunneled hemodialysis catheter via the RIGHT internal jugular vein with tip terminating within the proximal right atrium. The catheter is ready for immediate use. Michaelle Birks, MD [facility] Radiology Electronically Signed   By: Michaelle Birks M.D.   On: 07/17/2021 17:59   DG CHEST PORT 1 VIEW  Result Date: 07/19/2021 CLINICAL DATA:  Central line placement EXAM: PORTABLE CHEST 1 VIEW COMPARISON:  07/19/2021 FINDINGS: New left IJ central line tip overlies central SVC. Endotracheal tube, enteric tube, and right dialysis catheter again identified. No pneumothorax. Likely layering pleural effusion on the right. Patchy increased density bilaterally similar to prior. Stable cardiomediastinal contours. IMPRESSION: New left IJ central line tip overlies SVC.  No pneumothorax. Persistent patchy bilateral opacities and layering right pleural effusion. Electronically Signed   By:  Macy Mis M.D.   On: 07/19/2021 12:16   DG Chest Port 1 View  Result Date: 07/19/2021 CLINICAL DATA:  Endotracheal tube placement. EXAM: PORTABLE CHEST 1 VIEW COMPARISON:  Limb 1522 FINDINGS: Endotracheal tube tip is 3.6 cm above the base of the carina. Right IJ dialysis catheter tip overlies the  SVC/RA junction. No pneumothorax. Tiny bilateral pleural effusions again noted with basilar atelectasis or infiltrate. Diffuse underlying interstitial opacity again noted. The cardio pericardial silhouette is enlarged. Telemetry leads overlie the chest. IMPRESSION: 1. Endotracheal tube tip 3.6 cm above the base of the carina. 2. Tiny bilateral pleural effusions with basilar atelectasis or infiltrate, similar to the prior. No evidence for pneumothorax. Electronically Signed   By: Misty Stanley M.D.   On: 07/19/2021 11:21   DG Chest Port 1 View  Result Date: 07/07/2021 CLINICAL DATA:  Altered mental status x2 days, questionable sepsis. EXAM: PORTABLE CHEST 1 VIEW COMPARISON:  Chest radiograph July 02, 2021 FINDINGS: Dual lumen right chest wall dialysis catheter with tip overlying the superior cavoatrial junction. The heart size and mediastinal contours are stable. Similar size of the small right pleural effusion. Interval increased aeration of the right lower lobe of a decrease in the right basilar consolidation. No new focal consolidation. The visualized skeletal structures are unchanged. IMPRESSION: 1. Similar size of the small right pleural effusion with interval improved aeration of the right lower lobe and decreased in the right basilar consolidation. No new focal consolidation. Electronically Signed   By: Dahlia Bailiff M.D.   On: 07/07/2021 20:45   DG CHEST PORT 1 VIEW  Result Date: 07/02/2021 CLINICAL DATA:  Status post right thoracentesis. EXAM: PORTABLE CHEST 1 VIEW COMPARISON:  07/02/2021 FINDINGS: There is a right chest wall dialysis catheter with tips in the cavoatrial junction. Heart size  and mediastinal contours are stable. Decreased volume of the right pleural effusion status post thoracentesis. No pneumothorax identified. Atelectasis is identified within the right lung base. Similar appearance of mild bilateral hazy lung opacities throughout both lungs. IMPRESSION: 1. Decrease in volume of right pleural effusion status post thoracentesis. 2. No pneumothorax. Electronically Signed   By: Kerby Moors M.D.   On: 07/02/2021 15:44   DG CHEST PORT 1 VIEW  Result Date: 07/02/2021 CLINICAL DATA:  Weakness, pulmonary edema EXAM: PORTABLE CHEST 1 VIEW COMPARISON:  Portable exam 0838 hours compared to 06/29/2021 FINDINGS: RIGHT jugular dual lumen central venous catheter with tip projecting over SVC above cavoatrial junction. Enlargement of cardiac silhouette with pulmonary vascular congestion. Diffuse infiltrates consistent with pulmonary edema. Moderate RIGHT pleural effusion. No pneumothorax. Atherosclerotic calcification aorta. Bones demineralized with prior cervical spine fusion. IMPRESSION: Pulmonary infiltrate with increased RIGHT pleural effusion since previous exam. Aortic Atherosclerosis (ICD10-I70.0). Electronically Signed   By: Lavonia Dana M.D.   On: 07/02/2021 10:21   EEG adult  Result Date: 07/01/2021 Lora Havens, MD     07/01/2021  8:53 PM Patient Name: Blease Capaldi MRN: [id number] Epilepsy Attending: Lora Havens Referring Physician/Provider: Dr Susie Cassette Date: 07/01/2021 Duration: 24.30 mins Patient history: 60yo M patient was noted to seizure-like activity, he was found unresponsive after generalized tonic-clonic shaking.  Seizure activity stopped spontaneously after 1 minute. Post seizure patient was noted to be confused, hypoxic. EEG to evaluate for seizure. Level of alertness: Awake, asleep AEDs during EEG study: LEV Technical aspects: This EEG study was done with scalp electrodes positioned according to the 10-20 International system of electrode placement.  Electrical activity was acquired at a sampling rate of 500Hz  and reviewed with a high frequency filter of 70Hz  and a low frequency filter of 1Hz . EEG data were recorded continuously and digitally stored. Description: No posterior dominant rhythm was seen. Sleep was characterized by sleep spindles (12 to 14 Hz), maximal frontocentral region. EEG showed continuous generalized 3 to 6  Hz theta-delta slowing. Hyperventilation and photic stimulation were not performed.   ABNORMALITY - Continuous slow, generalized IMPRESSION: This study is suggestive of moderate diffuse encephalopathy, nonspecific etiology. No seizures or epileptiform discharges were seen throughout the recording. Priyanka Barbra Sarks   VAS US DUPLEX DIALYSIS ACCESS (AVF, AVG)  Result Date: 07/18/2021 DIALYSIS ACCESS Patient Name:  BRITIAN JENTZ  Date of Exam:   07/18/2021 Medical Rec #: [id number]         Accession #:    [id number] Date of Birth: [birth year]         Patient Gender: M Patient Age:   60 years Exam Location:  [facility] Procedure:      VAS US DUPLEX DIALYSIS ACCESS (AVF, AVG) Referring Phys: Risa Grill --------------------------------------------------------------------------------  Reason for Exam: Evaluation of stage one brachial-basilic AVF, patient lost to                  follow up after creation. Access Site: Right Upper Extremity. Access Type: Basilic vein transposition, first stage. History: 04-04-2021 Right brachio-basilic AVF creation.          02-02-2021 Ligation of left upper arm AVF.          01-10-2021 Ligation of left arm radiocephalic fistula, conversion to          brachiocephalic. Comparison Study: No recent prior studies. Performing Technologist: Darlin Coco RDMS, RVT  Examination Guidelines: A complete evaluation includes B-mode imaging, spectral Doppler, color Doppler, and power Doppler as needed of all accessible portions of each vessel. Unilateral testing is considered an integral part of a complete  examination. Limited examinations for reoccurring indications may be performed as noted.  Findings: +--------------------+----------+-----------------+--------+ AVF                 PSV (cm/s)Flow Vol (mL/min)Comments +--------------------+----------+-----------------+--------+ Native artery inflow   154           389                +--------------------+----------+-----------------+--------+ AVF Anastomosis        560                              +--------------------+----------+-----------------+--------+  +------------+----------+-------------+----------+--------+ OUTFLOW VEINPSV (cm/s)Diameter (cm)Depth (cm)Describe +------------+----------+-------------+----------+--------+ Prox UA         49        0.94        0.81            +------------+----------+-------------+----------+--------+ Mid UA         327        0.44        0.68            +------------+----------+-------------+----------+--------+ Dist UA        593        0.34        0.27            +------------+----------+-------------+----------+--------+   Summary: Patent stage one right arm brachial-basilic fistula. Refer to above tables for additional data.  *See table(s) above for measurements and observations.  Diagnosing physician: Orlie Pollen Electronically signed by Orlie Pollen on 07/18/2021 at 7:04:59 PM.    --------------------------------------------------------------------------------   Final    ECHOCARDIOGRAM COMPLETE  Result Date: 07/02/2021    ECHOCARDIOGRAM REPORT   Patient Name:   BRYLIN STANISLAWSKI Date of Exam: 07/02/2021 Medical Rec #:  [id number]        Height:  68.0 in Accession #:    [id number]       Weight:       130.1 lb Date of Birth:  09/30/1960        BSA:          1.702 m Patient Age:    49 years         BP:           125/48 mmHg Patient Gender: M                HR:           119 bpm. Exam Location:  Inpatient Procedure: 2D Echo, Cardiac Doppler and Color Doppler Indications:    CHF   History:        Patient has prior history of Echocardiogram examinations, most                 recent 01/13/2021. CHF, Pulmonary HTN, Aortic Valve Disease,                 Signs/Symptoms:Hypotension; Risk Factors:Hypertension. ESRD.  Sonographer:    Merrie Roof RDCS Referring Phys: [id number] [facility]  Sonographer Comments: Global longitudinal strain was attempted. The patient could no longer tolerate the exam. Strain attempted, before told to stop. IMPRESSIONS  1. Strain attempted but patient asked to stop. Would recomm limited echo with strain only.  2. LV cavitiy is small. Turburlent flow through LV. Near caviity obliteration. . Left ventricular ejection fraction, by estimation, is 70 to 75%. The left ventricle has hyperdynamic function. The left ventricle has no regional wall motion abnormalities.  There is severe concentric left ventricular hypertrophy. Left ventricular diastolic function could not be evaluated.  3. Right ventricular systolic function is moderately reduced. The right ventricular size is mildly enlarged. Mildly increased right ventricular wall thickness.  4. Left atrial size was severely dilated.  5. Right atrial size was severely dilated.  6. Mild mitral valve regurgitation. Moderate to severe mitral annular calcification.  7. Tricuspid valve regurgitation is moderate.  8. AV is thickened with restricted motion. Peak and mean gradients through the valve are 22 and 12 mm hg respectively. Dimensionless gradient is 0.53 consistent with mild AS 2 D imaging suggests that it is moderate. No significant change in gradient from May 2022. Aortic valve regurgitation is mild.  9. Aortic dilatation noted. There is mild dilatation of the ascending aorta, measuring 40 mm. FINDINGS  Left Ventricle: LV cavitiy is small. Turburlent flow through LV. Near caviity obliteration. Left ventricular ejection fraction, by estimation, is 70 to 75%. The left ventricle has hyperdynamic function. The left ventricle has no  regional wall motion abnormalities. The left ventricular internal cavity size was small. There is severe concentric left ventricular hypertrophy. Left ventricular diastolic function could not be evaluated. Right Ventricle: The right ventricular size is mildly enlarged. Mildly increased right ventricular wall thickness. Right ventricular systolic function is moderately reduced. Left Atrium: Left atrial size was severely dilated. Right Atrium: Right atrial size was severely dilated. Pericardium: Trivial pericardial effusion is present. Mitral Valve: There is mild thickening of the mitral valve leaflet(s). Moderate to severe mitral annular calcification. Mild mitral valve regurgitation. Tricuspid Valve: The tricuspid valve is grossly normal. Tricuspid valve regurgitation is moderate. Aortic Valve: AV is thickened with restricted motion. Peak and mean gradients through the valve are 22 and 12 mm hg respectively. Dimensionless gradient is 0.53 consistent with mild AS 2 D imaging suggests that it is moderate. No significant change in  gradient from May 2022. Aortic valve regurgitation is mild. Aortic valve mean gradient measures 12.0 mmHg. Aortic valve peak gradient measures 21.7 mmHg. Pulmonic Valve: The pulmonic valve was not well visualized. Pulmonic valve regurgitation is mild. Aorta: Aortic dilatation noted. There is mild dilatation of the ascending aorta, measuring 40 mm. IAS/Shunts: No atrial level shunt detected by color flow Doppler.  LEFT VENTRICLE PLAX 2D LVIDd:         2.90 cm LVIDs:         1.70 cm LV PW:         1.70 cm LV IVS:        1.70 cm  RIGHT VENTRICLE RV Basal diam:  4.50 cm RV Mid diam:    3.70 cm RV S prime:     11.10 cm/s TAPSE (M-mode): 1.3 cm LEFT ATRIUM              Index        RIGHT ATRIUM           Index LA diam:        3.50 cm  2.06 cm/m   RA Area:     28.60 cm LA Vol (A2C):   139.0 ml 81.66 ml/m  RA Volume:   99.30 ml  58.34 ml/m LA Vol (A4C):   75.6 ml  44.41 ml/m LA Biplane Vol:  103.0 ml 60.51 ml/m  AORTIC VALVE AV Vmax:           233.00 cm/s AV Vmean:          159.000 cm/s AV VTI:            0.304 m AV Peak Grad:      21.7 mmHg AV Mean Grad:      12.0 mmHg LVOT Vmax:         128.00 cm/s LVOT Vmean:        80.800 cm/s LVOT VTI:          0.162 m LVOT/AV VTI ratio: 0.53  AORTA Ao Root diam: 3.50 cm Ao Asc diam:  4.00 cm TRICUSPID VALVE TR Peak grad:   74.6 mmHg TR Vmax:        432.00 cm/s  SHUNTS Systemic VTI: 0.16 m Dorris Carnes MD Electronically signed by Dorris Carnes MD Signature Date/Time: 07/02/2021/7:51:14 PM    Final    VAS [place] LOWER EXTREMITY VENOUS (DVT)  Result Date: 07/01/2021  Lower Venous DVT Study Patient Name:  EINO WHITNER  Date of Exam:   07/01/2021 Medical Rec #: [id number]         Accession #:    [id number] Date of Birth: 06-03-1961         Patient Gender: M Patient Age:   22 years Exam Location:  [facility] Procedure:      VAS [place] LOWER EXTREMITY VENOUS (DVT) Referring Phys: MURALI RAMASWAMY --------------------------------------------------------------------------------  Indications: Edema.  Risk Factors: None identified. Comparison Study: No prior studies. Performing Technologist: Oliver Hum RVT  Examination Guidelines: A complete evaluation includes B-mode imaging, spectral Doppler, color Doppler, and power Doppler as needed of all accessible portions of each vessel. Bilateral testing is considered an integral part of a complete examination. Limited examinations for reoccurring indications may be performed as noted. The reflux portion of the exam is performed with the patient in reverse Trendelenburg.  +---------+---------------+---------+-----------+----------+--------------+ RIGHT    CompressibilityPhasicitySpontaneityPropertiesThrombus Aging +---------+---------------+---------+-----------+----------+--------------+ CFV      Full           Yes  No                                   +---------+---------------+---------+-----------+----------+--------------+ SFJ      Full                                                        +---------+---------------+---------+-----------+----------+--------------+ FV Prox  Full                                                        +---------+---------------+---------+-----------+----------+--------------+ FV Mid   Full                                                        +---------+---------------+---------+-----------+----------+--------------+ FV DistalFull                                                        +---------+---------------+---------+-----------+----------+--------------+ PFV      Full                                                        +---------+---------------+---------+-----------+----------+--------------+ POP      Full           Yes      No                                  +---------+---------------+---------+-----------+----------+--------------+ PTV      Full                                                        +---------+---------------+---------+-----------+----------+--------------+ PERO     Full                                                        +---------+---------------+---------+-----------+----------+--------------+   +---------+---------------+---------+-----------+----------+--------------+ LEFT     CompressibilityPhasicitySpontaneityPropertiesThrombus Aging +---------+---------------+---------+-----------+----------+--------------+ CFV      Full           Yes      No                                  +---------+---------------+---------+-----------+----------+--------------+ SFJ      Full                                                        +---------+---------------+---------+-----------+----------+--------------+  FV Prox  Full                                                         +---------+---------------+---------+-----------+----------+--------------+ FV Mid   Full                                                        +---------+---------------+---------+-----------+----------+--------------+ FV DistalFull                                                        +---------+---------------+---------+-----------+----------+--------------+ PFV      Full                                                        +---------+---------------+---------+-----------+----------+--------------+ POP      Full           Yes      No                                  +---------+---------------+---------+-----------+----------+--------------+ PTV      Full                                                        +---------+---------------+---------+-----------+----------+--------------+ PERO     Full                                                        +---------+---------------+---------+-----------+----------+--------------+     Summary: RIGHT: - There is no evidence of deep vein thrombosis in the lower extremity.  - No cystic structure found in the popliteal fossa.  LEFT: - There is no evidence of deep vein thrombosis in the lower extremity.  - No cystic structure found in the popliteal fossa.  *See table(s) above for measurements and observations. Electronically signed by Orlie Pollen on 07/01/2021 at 2:27:07 PM.    Final    ECHOCARDIOGRAM LIMITED  Result Date: 07/19/2021    ECHOCARDIOGRAM LIMITED REPORT   Patient Name:   REDDING CLOE Date of Exam: 07/19/2021 Medical Rec #:  [id number]        Height:       68.0 in Accession #:    [id number]       Weight:       134.5 lb Date of Birth:  [birth year]        BSA:          1.727 m Patient Age:  60 years         BP:           127/52 mmHg Patient Gender: M                HR:           100 bpm. Exam Location:  Inpatient Procedure: Limited Echo, Color Doppler and Cardiac Doppler Indications:    Cardiac Arrest   History:        Patient has prior history of Echocardiogram examinations, most                 recent 07/02/2021. CHF, COPD; Risk Factors:Hypertension and                 Dyslipidemia.  Sonographer:    Raquel Sarna Senior RDCS Referring Phys: [id number] Candee Furbish  Sonographer Comments: Defibrillator pad over parasternal window. IMPRESSIONS  1. Near cavity obliteration. There is an intracavitary gradient. Peak velocity 3.58 m/s. Peak gradient 51.3 mmHg. Left ventricular ejection fraction, by estimation, is >75%. The left ventricle has hyperdynamic function. The left ventricle has no regional wall motion abnormalities.  2. Right ventricle is heavily trabeculated. Right ventricular systolic function is severely reduced. There is severely elevated pulmonary artery systolic pressure.  3. Agitated saline contrast bubble study was negative, with no evidence of any interatrial shunt.  4. A small pericardial effusion is present.  5. Moderate to severe mitral annular calcification.  6. Thickening of the mitral valve leaflets. Tricuspid valve regurgitation is moderate to severe.  7. The inferior vena cava is dilated in size with <50% respiratory variability, suggesting right atrial pressure of 15 mmHg. FINDINGS  Left Ventricle: Near cavity obliteration. There is an intracavitary gradient. Peak velocity 3.58 m/s. Peak gradient 51.3 mmHg. Left ventricular ejection fraction, by estimation, is >75%. The left ventricle has hyperdynamic function. The left ventricle has no regional wall motion abnormalities. Right Ventricle: Right ventricle is heavily trabeculated. Right ventricular systolic function is severely reduced. There is severely elevated pulmonary artery systolic pressure. The tricuspid regurgitant velocity is 4.67 m/s, and with an assumed right atrial pressure of 15 mmHg, the estimated right ventricular systolic pressure is 638.4 mmHg. Pericardium: A small pericardial effusion is present. Mitral Valve: Moderate to severe mitral  annular calcification. Tricuspid Valve: Thickening of the mitral valve leaflets. Tricuspid valve regurgitation is moderate to severe. Venous: The inferior vena cava is dilated in size with less than 50% respiratory variability, suggesting right atrial pressure of 15 mmHg. IAS/Shunts: Agitated saline contrast was given intravenously to evaluate for intracardiac shunting. Agitated saline contrast bubble study was negative, with no evidence of any interatrial shunt. There is no evidence of a patent foramen ovale. There is no  evidence of an atrial septal defect. Additional Comments: Moderate ascites is present. TRICUSPID VALVE TR Peak grad:   87.2 mmHg TR Vmax:        467.00 cm/s Skeet Latch MD Electronically signed by Skeet Latch MD Signature Date/Time: 07/19/2021/5:35:15 PM    Final    US Abdomen Limited RUQ (LIVER/GB)  Result Date: 07/04/2021 CLINICAL DATA:  Transaminitis. EXAM: ULTRASOUND ABDOMEN LIMITED RIGHT UPPER QUADRANT COMPARISON:  April 09, 2015 FINDINGS: Gallbladder: A moderate to marked amount of heterogeneous echogenic sludge is noted within the gallbladder lumen. No gallstones are identified. The gallbladder wall measures approximately 3.7 mm in thickness. No sonographic Murphy sign noted by sonographer. Common bile duct: Diameter: 2.4 mm Liver: No focal lesion identified. Diffusely increased echogenicity of the liver parenchyma is  noted. Portal vein is patent on color Doppler imaging with normal direction of blood flow towards the liver. Other: A trace amount of right upper quadrant ascites is seen. Of incidental note is the presence of a right-sided pleural effusion. IMPRESSION: 1. Gallbladder sludge without evidence of cholelithiasis. 2. Hepatic steatosis. 3. Trace amount of right upper quadrant ascites. 4. Right pleural effusion. Electronically Signed   By: Virgina Norfolk M.D.   On: 07/04/2021 19:54    Microbiology: Recent Results (from the past 240 hour(s))  C Difficile Quick  Screen w PCR reflex     Status: None   Collection Time: 07/17/21 11:30 AM   Specimen: Stool  Result Value Ref Range Status   C Diff antigen NEGATIVE NEGATIVE Final   C Diff toxin NEGATIVE NEGATIVE Final   C Diff interpretation No C. difficile detected.  Final    Comment: Performed at [facility], [place] [address], [place], [place] [address]  Gastrointestinal Panel by PCR , Stool     Status: None   Collection Time: 07/17/21  5:30 PM   Specimen: Stool  Result Value Ref Range Status   Campylobacter species NOT DETECTED NOT DETECTED Final   Plesimonas shigelloides NOT DETECTED NOT DETECTED Final   Salmonella species NOT DETECTED NOT DETECTED Final   Yersinia enterocolitica NOT DETECTED NOT DETECTED Final   Vibrio species NOT DETECTED NOT DETECTED Final   Vibrio cholerae NOT DETECTED NOT DETECTED Final   Enteroaggregative E coli (EAEC) NOT DETECTED NOT DETECTED Final   Enteropathogenic E coli (EPEC) NOT DETECTED NOT DETECTED Final   Enterotoxigenic E coli (ETEC) NOT DETECTED NOT DETECTED Final   Shiga like toxin producing E coli (STEC) NOT DETECTED NOT DETECTED Final   Shigella/Enteroinvasive E coli (EIEC) NOT DETECTED NOT DETECTED Final   Cryptosporidium NOT DETECTED NOT DETECTED Final   Cyclospora cayetanensis NOT DETECTED NOT DETECTED Final   Entamoeba histolytica NOT DETECTED NOT DETECTED Final   Giardia lamblia NOT DETECTED NOT DETECTED Final   Adenovirus F40/41 NOT DETECTED NOT DETECTED Final   Astrovirus NOT DETECTED NOT DETECTED Final   Norovirus GI/GII NOT DETECTED NOT DETECTED Final   Rotavirus A NOT DETECTED NOT DETECTED Final   Sapovirus (I, II, IV, and V) NOT DETECTED NOT DETECTED Final    Comment: Performed at [facility], [address] [place], [place] [address]     Labs: Basic Metabolic Panel: Recent Labs  Lab 07/21/21 0400 07/22/21 0353 07/23/21 1714 07/24/21 0400 07/24/21 1953 07/25/21 0340 07/25/21 0830 07/26/21 0319  NA 134*   < > 136  137 135 135 139 136  K 4.3   < > 4.5 4.5 4.6 4.9 4.2 4.8  CL 101   < > 104 104 102 103 103 104  CO2 26   < > 27 25 25 24 28 26   GLUCOSE 105*   < > 103* 80 116* 87 102* 91  BUN 15   < > 10 14 25* 26* 12 21  CREATININE 2.31*   < > 1.73* 2.32* 3.16* 3.20* 2.36* 3.31*  CALCIUM 7.8*   < > 8.5* 9.0 9.3 9.2 9.0 9.0  MG 2.2  --   --   --   --   --   --   --   PHOS 2.5   < > 2.7  --  3.5 3.9 2.7 3.7   < > = values in this interval not displayed.   Liver Function Tests: Recent Labs  Lab 07/21/21 0400  07/22/21 0454 07/23/21 1714 07/24/21 1953 07/25/21 0340 07/25/21 0830 07/26/21 0319  AST 27  --   --   --   --   --   --   ALT 13  --   --   --   --   --   --   ALKPHOS 107  --   --   --   --   --   --   BILITOT 0.8  --   --   --   --   --   --   PROT 5.0*  --   --   --   --   --   --   ALBUMIN 1.9*  1.9*   < > 2.2* 2.2* 2.1* 2.3* 2.0*   < > = values in this interval not displayed.   No results for input(s): LIPASE, AMYLASE in the last 168 hours. No results for input(s): AMMONIA in the last 168 hours. CBC: Recent Labs  Lab 07/24/21 0400 07/24/21 1953 07/25/21 0340 07/25/21 0828 07/26/21 0319 07/27/21 0606  WBC 5.3 5.3 4.6 4.7 4.6 4.0  NEUTROABS 4.0  --  3.2 3.3 3.0 2.6  HGB 8.7* 9.0* 9.1* 9.8* 9.1* 9.3*  HCT 28.6* 29.0* 29.0* 30.8* 28.7* 30.9*  MCV 107.1* 105.5* 106.6* 104.8* 106.7* 108.8*  PLT 98* 108* 114* 111* 116* 128*   Cardiac Enzymes: No results for input(s): CKTOTAL, CKMB, CKMBINDEX, TROPONINI in the last 168 hours. BNP: BNP (last 3 results) Recent Labs    02/18/21 0107 03/29/21 0205 07/08/21 0608  BNP 4,369.1* >4,500.0* >4,500.0*    ProBNP (last 3 results) No results for input(s): PROBNP in the last 8760 hours.  CBG: Recent Labs  Lab 07/25/21 2322 07/26/21 0322 07/26/21 0751 07/26/21 1124 07/26/21 1533  GLUCAP 88 89 100* 89 95       Signed:  Nita Sells MD   [facility] 07/27/2021, 9:29 AM

## 2021-07-29 NOTE — Progress Notes (Signed)
Late Entry Note:  Spoke to Levada Dy at [facility] this am to make clinic aware pt left AMA on Saturday. Clinic has not heard from pt as of yet.  Melven Sartorius Renal Navigator [phone]

## 2021-07-30 ENCOUNTER — Ambulatory Visit: Payer: Medicare Other | Admitting: Cardiology

## 2021-08-02 MED FILL — Medication: Qty: 1 | Status: AC

## 2021-08-11 ENCOUNTER — Inpatient Hospital Stay (HOSPITAL_COMMUNITY)
Admission: EM | Admit: 2021-08-11 | Discharge: 2021-08-13 | DRG: 291 | Disposition: A | Discharge status: Home / Self Care | Payer: Medicare Other | Attending: Internal Medicine | Admitting: Internal Medicine

## 2021-08-11 ENCOUNTER — Encounter (HOSPITAL_COMMUNITY): Payer: Self-pay | Admitting: Emergency Medicine

## 2021-08-11 ENCOUNTER — Other Ambulatory Visit: Payer: Self-pay

## 2021-08-11 ENCOUNTER — Emergency Department (HOSPITAL_COMMUNITY): Payer: Medicare Other

## 2021-08-11 DIAGNOSIS — D631 Anemia in chronic kidney disease: Secondary | ICD-10-CM | POA: Diagnosis present

## 2021-08-11 DIAGNOSIS — J449 Chronic obstructive pulmonary disease, unspecified: Secondary | ICD-10-CM | POA: Diagnosis present

## 2021-08-11 DIAGNOSIS — I2781 Cor pulmonale (chronic): Secondary | ICD-10-CM | POA: Diagnosis present

## 2021-08-11 DIAGNOSIS — I5033 Acute on chronic diastolic (congestive) heart failure: Secondary | ICD-10-CM | POA: Diagnosis present

## 2021-08-11 DIAGNOSIS — Z8673 Personal history of transient ischemic attack (TIA), and cerebral infarction without residual deficits: Secondary | ICD-10-CM

## 2021-08-11 DIAGNOSIS — R072 Precordial pain: Secondary | ICD-10-CM | POA: Diagnosis present

## 2021-08-11 DIAGNOSIS — I509 Heart failure, unspecified: Secondary | ICD-10-CM

## 2021-08-11 DIAGNOSIS — N2581 Secondary hyperparathyroidism of renal origin: Secondary | ICD-10-CM | POA: Diagnosis present

## 2021-08-11 DIAGNOSIS — I712 Thoracic aortic aneurysm, without rupture, unspecified: Secondary | ICD-10-CM | POA: Diagnosis present

## 2021-08-11 DIAGNOSIS — Z8674 Personal history of sudden cardiac arrest: Secondary | ICD-10-CM

## 2021-08-11 DIAGNOSIS — N186 End stage renal disease: Secondary | ICD-10-CM | POA: Diagnosis not present

## 2021-08-11 DIAGNOSIS — I7121 Aneurysm of the ascending aorta, without rupture: Secondary | ICD-10-CM | POA: Diagnosis present

## 2021-08-11 DIAGNOSIS — E785 Hyperlipidemia, unspecified: Secondary | ICD-10-CM | POA: Diagnosis present

## 2021-08-11 DIAGNOSIS — E1122 Type 2 diabetes mellitus with diabetic chronic kidney disease: Secondary | ICD-10-CM | POA: Diagnosis present

## 2021-08-11 DIAGNOSIS — Z9981 Dependence on supplemental oxygen: Secondary | ICD-10-CM

## 2021-08-11 DIAGNOSIS — J9621 Acute and chronic respiratory failure with hypoxia: Secondary | ICD-10-CM | POA: Diagnosis present

## 2021-08-11 DIAGNOSIS — R0789 Other chest pain: Secondary | ICD-10-CM | POA: Diagnosis present

## 2021-08-11 DIAGNOSIS — Z981 Arthrodesis status: Secondary | ICD-10-CM

## 2021-08-11 DIAGNOSIS — Z79899 Other long term (current) drug therapy: Secondary | ICD-10-CM

## 2021-08-11 DIAGNOSIS — I480 Paroxysmal atrial fibrillation: Secondary | ICD-10-CM | POA: Diagnosis present

## 2021-08-11 DIAGNOSIS — K760 Fatty (change of) liver, not elsewhere classified: Secondary | ICD-10-CM | POA: Diagnosis present

## 2021-08-11 DIAGNOSIS — Z7901 Long term (current) use of anticoagulants: Secondary | ICD-10-CM

## 2021-08-11 DIAGNOSIS — Z20822 Contact with and (suspected) exposure to covid-19: Secondary | ICD-10-CM | POA: Diagnosis present

## 2021-08-11 DIAGNOSIS — L89159 Pressure ulcer of sacral region, unspecified stage: Secondary | ICD-10-CM | POA: Diagnosis present

## 2021-08-11 DIAGNOSIS — G2581 Restless legs syndrome: Secondary | ICD-10-CM | POA: Diagnosis present

## 2021-08-11 DIAGNOSIS — K219 Gastro-esophageal reflux disease without esophagitis: Secondary | ICD-10-CM | POA: Diagnosis present

## 2021-08-11 DIAGNOSIS — I132 Hypertensive heart and chronic kidney disease with heart failure and with stage 5 chronic kidney disease, or end stage renal disease: Secondary | ICD-10-CM | POA: Diagnosis not present

## 2021-08-11 DIAGNOSIS — R079 Chest pain, unspecified: Principal | ICD-10-CM

## 2021-08-11 DIAGNOSIS — I5032 Chronic diastolic (congestive) heart failure: Secondary | ICD-10-CM | POA: Diagnosis not present

## 2021-08-11 DIAGNOSIS — Z992 Dependence on renal dialysis: Secondary | ICD-10-CM

## 2021-08-11 DIAGNOSIS — I2729 Other secondary pulmonary hypertension: Secondary | ICD-10-CM | POA: Diagnosis present

## 2021-08-11 DIAGNOSIS — Z881 Allergy status to other antibiotic agents status: Secondary | ICD-10-CM

## 2021-08-11 DIAGNOSIS — M1990 Unspecified osteoarthritis, unspecified site: Secondary | ICD-10-CM | POA: Diagnosis present

## 2021-08-11 DIAGNOSIS — Z9114 Patient's other noncompliance with medication regimen: Secondary | ICD-10-CM

## 2021-08-11 DIAGNOSIS — R778 Other specified abnormalities of plasma proteins: Secondary | ICD-10-CM | POA: Diagnosis present

## 2021-08-11 DIAGNOSIS — I10 Essential (primary) hypertension: Secondary | ICD-10-CM | POA: Diagnosis present

## 2021-08-11 DIAGNOSIS — I959 Hypotension, unspecified: Secondary | ICD-10-CM | POA: Diagnosis present

## 2021-08-11 LAB — BASIC METABOLIC PANEL
Anion gap: 16 — ABNORMAL HIGH (ref 5–15)
BUN: 31 mg/dL — ABNORMAL HIGH (ref 8–23)
CO2: 28 mmol/L (ref 22–32)
Calcium: 10.3 mg/dL (ref 8.9–10.3)
Chloride: 97 mmol/L — ABNORMAL LOW (ref 98–111)
Creatinine, Ser: 5.28 mg/dL — ABNORMAL HIGH (ref 0.61–1.24)
GFR, Estimated: 12 mL/min — ABNORMAL LOW (ref >60)
Glucose, Bld: 86 mg/dL (ref 70–99)
Potassium: 3.9 mmol/L (ref 3.5–5.1)
Sodium: 141 mmol/L (ref 135–145)

## 2021-08-11 LAB — TROPONIN I (HIGH SENSITIVITY)
Troponin I (High Sensitivity): 98 ng/L — ABNORMAL HIGH (ref <18)
Troponin I (High Sensitivity): 93 ng/L — ABNORMAL HIGH (ref <18)

## 2021-08-11 LAB — CBC WITH DIFFERENTIAL/PLATELET
Abs Immature Granulocytes: 0.01 10*3/uL (ref 0.00–0.07)
Basophils Absolute: 0.1 10*3/uL (ref 0.0–0.1)
Basophils Relative: 3 %
Eosinophils Absolute: 0.2 10*3/uL (ref 0.0–0.5)
Eosinophils Relative: 4 %
HCT: 32.3 % — ABNORMAL LOW (ref 39.0–52.0)
Hemoglobin: 10.2 g/dL — ABNORMAL LOW (ref 13.0–17.0)
Immature Granulocytes: 0 %
Lymphocytes Relative: 23 %
Lymphs Abs: 1 10*3/uL (ref 0.7–4.0)
MCH: 33.2 pg (ref 26.0–34.0)
MCHC: 31.6 g/dL (ref 30.0–36.0)
MCV: 105.2 fL — ABNORMAL HIGH (ref 80.0–100.0)
Monocytes Absolute: 0.4 10*3/uL (ref 0.1–1.0)
Monocytes Relative: 9 %
Neutro Abs: 2.6 10*3/uL (ref 1.7–7.7)
Neutrophils Relative %: 61 %
Platelets: 230 10*3/uL (ref 150–400)
RBC: 3.07 MIL/uL — ABNORMAL LOW (ref 4.22–5.81)
RDW: 16.2 % — ABNORMAL HIGH (ref 11.5–15.5)
WBC: 4.2 10*3/uL (ref 4.0–10.5)
nRBC: 0 % (ref 0.0–0.2)

## 2021-08-11 NOTE — ED Provider Notes (Signed)
  Physical Exam  BP (!) 109/46   Pulse 75   Temp 98.5 F (36.9 C) (Oral)   Resp (!) 34   SpO2 95%   Physical Exam  ED Course/Procedures   Clinical Course as of 08/11/21 2224  Sun Aug 11, 2021  1954 CBC with mild anemia similar to baseline.  [CS]  2014 CXR with edema, not hypoxic or in distress. Will likely be able to get dialysis in the AM.  [CS]  2101 Care of the patient signed out to Dr. Alvino Chapel pending labs, anticipate will need admission. [CS]    Clinical Course User Index [CS] Truddie Hidden, MD    Procedures  MDM  Patient with chest pain.  Had some hypotension.  Has CHF on x-ray.  Had some chest pain.  Troponin mildly elevated.  I think with chest pain hypotension and need for dialysis would benefit from mission to the hospital.  Will discuss with hospitalist       Davonna Belling, MD 08/11/21 2224

## 2021-08-11 NOTE — ED Notes (Signed)
Pt states he is going to take this RN to dinner and is stating inappropriate conversations. RN states that is not appropriate at all. Pt stated "he does not give a sh** and he will say whatever the fu** he wants."

## 2021-08-11 NOTE — ED Notes (Signed)
Chest pain labs sent including blue top

## 2021-08-11 NOTE — H&P (Signed)
Thomas Robinson WER:[id number] DOB: April 19, 1961 DOA: 08/11/2021     PCP: [name], PA   Outpatient Specialists:     NEphrology:    Dr.Goldsborogh    Patient arrived to ER on 08/11/21 at 1806 Referred by Attending Davonna Belling, MD   Patient coming from: home Lives With family    Chief Complaint:   Chief Complaint  Patient presents with   Chest Pain    HPI: Thomas Robinson is a 60 y.o. male with medical history significant of ESRD on HD M,W,F  Chronic hypoxia on 3L, asthma, CVA, chronic diastolic CHF, COPD, GERD, HLD, HTN Thoracic ascending aortic aneurysm 4.5 cm 06/04/19 CT   Presented with    Reports chest pain intermittently since last night.  Last dialysis Friday.  Describes it as squeezing pain in center of chest, given 324mg  of aspirin by EMS Pain worse with movement Reports CP comes and goes feels like zap and pressure Unsure how long it lasts Feels tight in his chest He was at home watching TV when pain started Reports felt better with aspirin Pain comes and goes and lasts few min a atime No fever has been compliant with dialysis since then. Past hx of CPR in November he had a 2-minute PEA arrest last month when he was sedated for a dialysis fistula.  GI bleed in November, left AMA More details as follows: 07/05/2021 left AMA, readmitted 07/11/2021--at those admission had septic shock with transaminitis right-sided pneumonia with pleural effusions which were transudative on thoracentesis, subsequently had bright red blood per rectum and stopped Eliquis on his own--had EGD colonoscopy 11/10 showing internal hemorrhoids barotrauma no active bleed Readmitted 11/15 with chest pain in the setting of missing dialysis-also found to have balanitis It appears patient had elected to have  hospice but patient change his mind decided to have dialysis again and revoked this so needed dialysis access which was attempted as below-in the process of placing AV F patient  had brief cardiac arrest for 2 minutes Ultimately had PEA arrest as below and transferred to critical care      Has   been vaccinated against COVID and boosted and flu shot   Initial COVID TEST  in house  PCR testing  Pending  Lab Results  Component Value Date   [place] 07/16/2021   [place] NEGATIVE 07/07/2021   [place] NEGATIVE 06/29/2021   [place] NEGATIVE 05/30/2021    Regarding pertinent Chronic problems:       HTN on metoprolol,    chronic CHF diastolic - last echo November 2022 Left ventricular ejection  fraction, by estimation, is >75%. On lasix     Hx of CVA -  with/out residual deficits on Aspirin 81 mg,    A. Fib -  - CHA2DS2 vas score  >4    current  on anticoagulation with   Eliquis? He is unsure if he is actually taking it        -  Rate control:  Currently controlled with  Metoprolol,         ESRD on HD Lab Results  Component Value Date   CREATININE 5.28 (H) 08/11/2021   CREATININE 3.31 (H) 07/26/2021   CREATININE 2.36 (H) 07/25/2021     Chronic anemia - baseline hg Hemoglobin & Hematocrit  Recent Labs    07/26/21 0319 07/27/21 0606 08/11/21 1837  HGB 9.1* 9.3* 10.2*    While in ER: Hypotensive 95/51, trop 98  CXR with mild edema  ED Triage Vitals  Enc Vitals Group     BP 08/11/21 1834 (!) 95/51     Pulse Rate 08/11/21 1822 81     Resp 08/11/21 1822 (!) 22     Temp 08/11/21 1822 98.5 F (36.9 C)     Temp Source 08/11/21 1822 Oral     SpO2 08/11/21 1822 100 %     Weight --      Height --      Head Circumference --      Peak Flow --      Pain Score --      Pain Loc --      Pain Edu? --      Excl. in [place]? --   TMAX(24)@     _________________________________________ Significant initial  Findings: Abnormal Labs Reviewed  BASIC METABOLIC PANEL - Abnormal; Notable for the following components:      Result Value   Chloride 97 (*)    BUN 31 (*)    Creatinine, Ser 5.28 (*)    GFR, Estimated 12 (*)    Anion gap  16 (*)    All other components within normal limits  CBC WITH DIFFERENTIAL/PLATELET - Abnormal; Notable for the following components:   RBC 3.07 (*)    Hemoglobin 10.2 (*)    HCT 32.3 (*)    MCV 105.2 (*)    RDW 16.2 (*)    All other components within normal limits  TROPONIN I (HIGH SENSITIVITY) - Abnormal; Notable for the following components:   Troponin I (High Sensitivity) 98 (*)    All other components within normal limits  TROPONIN I (HIGH SENSITIVITY) - Abnormal; Notable for the following components:   Troponin I (High Sensitivity) 93 (*)    All other components within normal limits   ____________________________________________ Ordered    CXR - Cardiomegaly with moderate diffuse pulmonary interstitial edema and right greater than left pleural effusions, suggesting CHF.    _________________________ Troponin 93 - 98 ECG: Ordered Personally reviewed by me showing: HR : 78 Rhythm:  NSR,   nonspecific changes,   QTC 458   The recent clinical data is shown below. Vitals:   08/11/21 1930 08/11/21 2004 08/11/21 2015 08/11/21 2200  BP: (!) 131/52 (!) 107/45 (!) 128/99 (!) 109/46  Pulse: 78 76 84 75  Resp: (!) 28 (!) 22 (!) 21 (!) 34  Temp:      TempSrc:      SpO2: 94% 93% 97% 95%     WBC     Component Value Date/Time   WBC 4.2 08/11/2021 1837   LYMPHSABS 1.0 08/11/2021 1837   MONOABS 0.4 08/11/2021 1837   EOSABS 0.2 08/11/2021 1837   BASOSABS 0.1 08/11/2021 1837        C Difficile Quick Screen w PCR reflex     Status: None   Collection Time: 07/17/21 11:30 AM   Specimen: Stool  Result Value Ref Range Status   C Diff antigen NEGATIVE NEGATIVE Final   C Diff toxin NEGATIVE NEGATIVE Final   C Diff interpretation No C. difficile detected.  Final    Comment: Performed at [facility], [place] [address], [place], [place] [address]  Gastrointestinal Panel by PCR , Stool     Status: None   Collection Time: 07/17/21  5:30 PM   Specimen: Stool  Result Value Ref  Range Status   Campylobacter species NOT DETECTED NOT DETECTED Final   Plesimonas shigelloides NOT DETECTED NOT DETECTED Final  Salmonella species NOT DETECTED NOT DETECTED Final   Yersinia enterocolitica NOT DETECTED NOT DETECTED Final   Vibrio species NOT DETECTED NOT DETECTED Final   Vibrio cholerae NOT DETECTED NOT DETECTED Final   Enteroaggregative E coli (EAEC) NOT DETECTED NOT DETECTED Final   Enteropathogenic E coli (EPEC) NOT DETECTED NOT DETECTED Final   Enterotoxigenic E coli (ETEC) NOT DETECTED NOT DETECTED Final   Shiga like toxin producing E coli (STEC) NOT DETECTED NOT DETECTED Final   Shigella/Enteroinvasive E coli (EIEC) NOT DETECTED NOT DETECTED Final   Cryptosporidium NOT DETECTED NOT DETECTED Final   Cyclospora cayetanensis NOT DETECTED NOT DETECTED Final   Entamoeba histolytica NOT DETECTED NOT DETECTED Final   Giardia lamblia NOT DETECTED NOT DETECTED Final   Adenovirus F40/41 NOT DETECTED NOT DETECTED Final   Astrovirus NOT DETECTED NOT DETECTED Final   Norovirus GI/GII NOT DETECTED NOT DETECTED Final   Rotavirus A NOT DETECTED NOT DETECTED Final   Sapovirus (I, II, IV, and V) NOT DETECTED NOT DETECTED Final    Comment: Performed at [facility], [address], [place], [place] [address]    _______________________________________________________  Provider Called:  nephrology    Dr.Patel They Recommend admit to medicine   Will see in AM  _______________________________________________ Hospitalist was called for admission for chest pain   The following Work up has been ordered so far:  Orders Placed This Encounter  Procedures   DG Chest Port 1 View   Basic metabolic panel   CBC with Differential   Consult to hospitalist   EKG 12-Lead     Following Medications were ordered in ER: Medications - No data to display      Consult Orders  (From admission, onward)           Start     Ordered   08/11/21 2223  Consult to hospitalist  Paged  by Kennyth Lose  Once       Provider:  (Not yet assigned)  Question Answer Comment  Place call to: [facility]   Reason for Consult Admit      08/11/21 2222              OTHER Significant initial  Findings:  labs showing:    Recent Labs  Lab 08/11/21 1837  NA 141  K 3.9  CO2 28  GLUCOSE 86  BUN 31*  CREATININE 5.28*  CALCIUM 10.3    Cr   Up from baseline see below Lab Results  Component Value Date   CREATININE 5.28 (H) 08/11/2021   CREATININE 3.31 (H) 07/26/2021   CREATININE 2.36 (H) 07/25/2021    No results for input(s): AST, ALT, ALKPHOS, BILITOT, PROT, ALBUMIN in the last 168 hours. Lab Results  Component Value Date   CALCIUM 10.3 08/11/2021   PHOS 3.7 07/26/2021       Plt: Lab Results  Component Value Date   PLT 230 08/11/2021        Recent Labs  Lab 08/11/21 1837  WBC 4.2  NEUTROABS 2.6  HGB 10.2*  HCT 32.3*  MCV 105.2*  PLT 230    HG/HCT  stable,       Component Value Date/Time   HGB 10.2 (L) 08/11/2021 1837   HCT 32.3 (L) 08/11/2021 1837   HCT 27.3 (L) 05/17/2019 0203   MCV 105.2 (H) 08/11/2021 1837       BNP (last 3 results) Recent Labs    02/18/21 0107 03/29/21 0205 07/08/21 0608  BNP 4,369.1* >4,500.0* >4,500.0*  DM  labs:  HbA1C: Recent Labs    07/01/21 1331  HGBA1C 4.6*       CBG (last 3)  No results for input(s): GLUCAP in the last 72 hours.        Cultures:    Component Value Date/Time   SDES BLOOD LEFT ARM 07/08/2021 0619   SPECREQUEST  07/08/2021 0619    BOTTLES DRAWN AEROBIC AND ANAEROBIC Blood Culture adequate volume   CULT  07/08/2021 0619    NO GROWTH 5 DAYS Performed at [facility], [place] [address] [place], [place] [address]    REPTSTATUS 07/13/2021 FINAL 07/08/2021 0354     Radiological Exams on Admission: DG Chest Port 1 View  Result Date: 08/11/2021 CLINICAL DATA:  Initial evaluation for acute chest pain, shortness of breath, weakness. End-stage renal disease on  hemodialysis. EXAM: PORTABLE CHEST 1 VIEW COMPARISON:  Prior radiograph from 07/19/2021. FINDINGS: Right-sided hemodialysis catheter in place with tip overlying the cavoatrial junction. Moderate cardiomegaly, stable. Mediastinal silhouette within normal limits. Aortic atherosclerosis. Lungs normally inflated. Diffuse vascular and interstitial prominence compatible with moderate diffuse pulmonary interstitial edema. Right greater than left pleural effusions. Associated right basilar opacity likely reflects atelectasis and/or edema. No pneumothorax. No acute osseous finding.  Cervical ACDF noted. IMPRESSION: 1. Cardiomegaly with moderate diffuse pulmonary interstitial edema and right greater than left pleural effusions, suggesting CHF. 2. Associated right basilar opacity, likely atelectasis and/or edema. Electronically Signed   By: Jeannine Boga M.D.   On: 08/11/2021 20:09   _______________________________________________________________________________________________________ Latest  Blood pressure (!) 109/46, pulse 75, temperature 98.5 F (36.9 C), temperature source Oral, resp. rate (!) 34, SpO2 95 %.   Review of Systems:    Pertinent positives include:  fatigue,  Constitutional:  No weight loss, night sweats, Fevers, chills,  weight loss  HEENT:  No headaches, Difficulty swallowing,Tooth/dental problems,Sore throat,  No sneezing, itching, ear ache, nasal congestion, post nasal drip,  Cardio-vascular:  No chest pain, Orthopnea, PND, anasarca, dizziness, palpitations.no Bilateral lower extremity swelling  GI:  No heartburn, indigestion, abdominal pain, nausea, vomiting, diarrhea, change in bowel habits, loss of appetite, melena, blood in stool, hematemesis Resp:  no shortness of breath at rest. No dyspnea on exertion, No excess mucus, no productive cough, No non-productive cough, No coughing up of blood.No change in color of mucus.No wheezing. Skin:  no rash or lesions. No jaundice GU:   no dysuria, change in color of urine, no urgency or frequency. No straining to urinate.  No flank pain.  Musculoskeletal:  No joint pain or no joint swelling. No decreased range of motion. No back pain.  Psych:  No change in mood or affect. No depression or anxiety. No memory loss.  Neuro: no localizing neurological complaints, no tingling, no weakness, no double vision, no gait abnormality, no slurred speech, no confusion  All systems reviewed and apart from [place] all are negative _______________________________________________________________________________________________ Past Medical History:   Past Medical History:  Diagnosis Date   Anaphylactic shock, unspecified, sequela 06/10/2019   Aortic stenosis    mild-moderate AS 12/2020 echo   Arthritis    Asthma, chronic, unspecified asthma severity, with acute exacerbation 10/23/2017   CAP (community acquired pneumonia) 10/23/2017   Carpal tunnel syndrome of right wrist 10/05/2018   Cataract    right - removed by surgery   Cerebral infarction due to thrombosis of cerebral artery (HCC)    Cervical disc herniation 01/04/2020   Chronic diastolic heart failure ([place]) 04/13/2018   Chronic low back  pain 11/24/2019   Constipation    COPD (chronic obstructive pulmonary disease) (HCC)    Cough    chronic cough   Dyspnea    Encounter for immunization 07/08/2017   ESRD on hemodialysis ([place]) 02/16/2018   Fall 06/04/2019   GERD (gastroesophageal reflux disease) 06/04/2019   GI bleeding 05/24/2017   Gram-negative sepsis, unspecified ([place]) 09/05/2017   History of fusion of cervical spine 03/26/2020   Hyperlipidemia    Hypertension    Hypokalemia 06/04/2017   Iron deficiency anemia, unspecified 09/09/2017   Left shoulder pain 11/11/2019   Lumbar radiculopathy 11/24/2019   Lung contusion 06/04/2019   LVH (left ventricular hypertrophy) due to hypertensive disease, with heart failure ([place]) 06/18/2020   Macrocytic anemia 05/24/2017    Moderate protein-calorie malnutrition ([place]) 06/06/2017   Myofascial pain syndrome 06/12/2020   Neuritis of right ulnar nerve 09/13/2018   Non-compliance with renal dialysis ([place]) 02/08/2020   Oxygen deficiency 12/30/2019   O2 sats on RA 87% at PAT appt    Pain in joint of right elbow 09/13/2018   Pulmonary hypertension ([place])    Renovascular hypertension 06/22/2020   Restless leg syndrome    Rib fractures 06/2019   Right   S/P cardiac cath 08/27/2020   normal coronary arteries.   Secondary hyperparathyroidism of renal origin ([place]) 06/04/2017   Thoracic ascending aortic aneurysm    4.5 cm 06/04/19 CT   Ulnar neuropathy 10/05/2018   Volume overload 10/23/2017      Past Surgical History:  Procedure Laterality Date   A/V FISTULAGRAM N/A 01/01/2021   Procedure: A/V FISTULAGRAM;  Surgeon: Serafina Mitchell, MD;  Location: [facility];  Service: Cardiovascular;  Laterality: N/A;   A/V FISTULAGRAM N/A 01/15/2021   Procedure: A/V [id number];  Surgeon: Serafina Mitchell, MD;  Location: [facility];  Service: Cardiovascular;  Laterality: N/A;   ANTERIOR CERVICAL DECOMP/DISCECTOMY FUSION N/A 01/04/2020   Procedure: ANTERIOR CERVICAL DECOMPRESSION/DISCECTOMY FUSION CERVICAL FIVE THROUGH SEVEN;  Surgeon: Melina Schools, MD;  Location: [facility];  Service: Orthopedics;  Laterality: N/A;  3 hrs   AV FISTULA PLACEMENT Left 05/28/2017   Procedure: LEFT ARM ARTERIOVENOUS (AV) FISTULA CREATION;  Surgeon: Conrad Pantego, MD;  Location: [facility];  Service: Vascular;  Laterality: Left;   AV FISTULA PLACEMENT Left 02/02/2021   Procedure: Debride left forearm, ligation of left upper arm Aretiovenous fistula;  Surgeon: Elam Dutch, MD;  Location: [facility];  Service: Vascular;  Laterality: Left;   AV FISTULA PLACEMENT Right 04/04/2021   Procedure: RIGHT ARTERIOVENOUS (AV) FISTULA CREATION;  Surgeon: Serafina Mitchell, MD;  Location: [facility];  Service: Vascular;  Laterality: Right;   BIOPSY  07/10/2021   Procedure:  BIOPSY;  Surgeon: Lavena Bullion, DO;  Location: [facility];  Service: Gastroenterology;;   COLONOSCOPY WITH PROPOFOL N/A 07/10/2021   Procedure: COLONOSCOPY WITH PROPOFOL;  Surgeon: Lavena Bullion, DO;  Location: [facility];  Service: Gastroenterology;  Laterality: N/A;   ESOPHAGOGASTRODUODENOSCOPY (EGD) WITH PROPOFOL N/A 07/10/2021   Procedure: ESOPHAGOGASTRODUODENOSCOPY (EGD) WITH PROPOFOL;  Surgeon: Lavena Bullion, DO;  Location: [facility];  Service: Gastroenterology;  Laterality: N/A;   EYE SURGERY Right 06/02/2019   Cataract removed   FRACTURE SURGERY     INSERTION OF DIALYSIS CATHETER Right 02/02/2021   Procedure: INSERTION OF Right Internal jugular TUNNELED  DIALYSIS CATHETER;  Surgeon: Elam Dutch, MD;  Location: [facility];  Service: Vascular;  Laterality: Right;   IR DIALY SHUNT INTRO NEEDLE/INTRACATH INITIAL W/IMG  LEFT Left 09/12/2020   IR FLUORO GUIDE CV LINE RIGHT  05/25/2017   IR FLUORO GUIDE CV LINE RIGHT  07/17/2021   IR US GUIDE VASC ACCESS LEFT  09/12/2020   IR US GUIDE VASC ACCESS RIGHT  05/25/2017   IR US GUIDE VASC ACCESS RIGHT  07/17/2021   LIGATION OF ARTERIOVENOUS  FISTULA Left 01/10/2021   Procedure: LIGATION OF LEFT ARM RADIOCEPHALIC FISTULA;  Surgeon: Serafina Mitchell, MD;  Location: [facility];  Service: Vascular;  Laterality: Left;   PERIPHERAL VASCULAR BALLOON ANGIOPLASTY Left 01/15/2021   Procedure: PERIPHERAL VASCULAR BALLOON ANGIOPLASTY;  Surgeon: Serafina Mitchell, MD;  Location: [facility];  Service: Cardiovascular;  Laterality: Left;  AVF   REVISON OF ARTERIOVENOUS FISTULA Left 07/18/2019   Procedure: REVISION PLICATION OF RADIOCEPHALIC ARTERIOVENOUS FISTULA LEFT ARM;  Surgeon: Angelia Mould, MD;  Location: [facility];  Service: Vascular;  Laterality: Left;   REVISON OF ARTERIOVENOUS FISTULA Left 01/10/2021   Procedure: CONVERSION TO BRACHIOCEPHALIC ARTERIOVENOUS FISTULA;  Surgeon: Serafina Mitchell, MD;  Location: [facility];  Service: Vascular;   Laterality: Left;   RINOPLASTY      Social History:  Ambulatory   independently      reports that he has never smoked. He has never used smokeless tobacco. He reports that he does not currently use alcohol. He reports that he does not use drugs.     Family History:   Family History  Problem Relation Age of Onset   Cancer Mother    ______________________________________________________________________________________________ Allergies: Allergies  Allergen Reactions   Vancomycin Shortness Of Breath and Itching     Prior to Admission medications   Medication Sig Start Date End Date Taking? Authorizing Provider  metoprolol tartrate (LOPRESSOR) 50 MG tablet Take 1 tablet (50 mg total) by mouth 2 (two) times daily. 06/01/21  Yes Oswald Hillock, MD  multivitamin (RENA-VIT) TABS tablet Take 1 tablet by mouth at bedtime. 01/12/18  Yes [provider]  sevelamer carbonate (RENVELA) 800 MG tablet Take 800-1,600 mg by mouth See admin instructions. Take 1-2 tablets ([phone] mg) by mouth three times daily with meals, take 1 tablet (800 mg) with snacks 05/31/21  Yes [provider]  albuterol (VENTOLIN HFA) 108 (90 Base) MCG/ACT inhaler Inhale 2 puffs into the lungs 2 (two) times daily as needed for shortness of breath or wheezing. Patient not taking: Reported on 08/11/2021 06/01/21   Oswald Hillock, MD  apixaban (ELIQUIS) 2.5 MG TABS tablet Take 1 tablet (2.5 mg total) by mouth 2 (two) times daily. Patient not taking: Reported on 07/16/2021 07/13/21 08/12/21  Darliss Cheney, MD  cinacalcet (SENSIPAR) 60 MG tablet Take 1 tablet (60 mg total) by mouth every evening. Patient not taking: Reported on 07/16/2021 06/01/21   Oswald Hillock, MD  furosemide (LASIX) 80 MG tablet Take 1 tablet (80 mg total) by mouth daily as needed for fluid (Use only on non-HD days (Tues/Thur/Sat/Sun)). Patient not taking: Reported on 08/11/2021 06/01/21   Oswald Hillock, MD     ___________________________________________________________________________________________________ Physical Exam: Vitals with BMI 08/11/2021 08/11/2021 08/11/2021  Height - - -  Weight - - -  BMI - - -  Systolic [phone]  Diastolic 46 99 45  Pulse 75 84 76     1. General:  in No  Acute distress    Chronically ill  -appearing 2. Psychological: Alert and  Oriented 3. Head/ENT:  Dry Mucous Membranes  Head Non traumatic, neck supple                           Poor Dentition 4. SKIN: normal  Skin turgor,  Skin clean Dry and intact no rash 5. Heart: Regular rate and rhythm systolic Murmur, no Rub or gallop 6. Lungs:  no wheezes some  crackles   7. Abdomen: Soft,  non-tender, Non distended  bowel sounds present 8. Lower extremities: no clubbing, cyanosis, no  edema RIGHT > LEFT 9. Neurologically Grossly intact, moving all 4 extremities equally  10. MSK: Normal range of motion    Chart has been reviewed  ______________________________________________________________________________________________  Assessment/Plan  60 y.o. male with medical history significant of ESRD on HD M,W,F  Chronic hypoxia on 3L, asthma, CVA, chronic diastolic CHF, COPD, GERD, HLD, HTN Thoracic ascending aortic aneurysm 4.5 cm 06/04/19 CT   Admitted for fluid overload  Present on Admission:  Chest pain - somewhat atypical intermittent lasting only a few min and spasm squeezing like Trop stable, down from baseline, obtain echo Emailed cardiology given risk factors None pleuritic. Unable to obtain CTA due to poor access (bilateral fistulas) - low suspicion at this time, can order VQ scan if becomes more worrisome    Essential hypertension - chronic hold home meds given soft BP in the past needed midodrine Can try to use today     acute on Chronic diastolic heart failure (HCC) - in the setting of fluid overload , nephrology aware plan for HD in AM   Anemia in ESRD  (end-stage renal disease) ([place]) - chronic stable obtain anemia panel   Hypertension now hypotensive hold home meds   Elevated troponin - mild stable with atypical CP no ischemic changes Obtain serial ECG, ECHO, likely  due to poor clearance in the setting of ESRD    Sacral breakdown - order wound care consult  Leg edema - R>L most likley in the setting of fluid overload, will check albumin as well and dopplers  A.fib - restarted eliquis for now, hold BB due to hypotension  COPD - stable   Other plan as per orders.  DVT prophylaxis:  eliquis    Code Status:    Code Status: Prior FULL CODE as per patient    I had personally discussed CODE STATUS with patient   Family Communication:   Family not at  Bedside    Disposition Plan:      To home once workup is complete and patient is stable   Following barriers for discharge:                            Electrolytes corrected                                                       Pain controlled with PO medications                               antibiotics                             Will need to be able to tolerate PO  Will likely need home health, home O2, set up                           Will need consultants to evaluate patient prior to discharge                      Wound care  consulted                                 Consults called: nephrology is aware, emailed cardiology  Admission status:  ED Disposition     ED Disposition  [place]: [place] [[id number]]  Level of Care: Telemetry Cardiac [[id number]]  May place patient in observation at [facility] or [facility] if equivalent level of care is available:: No  Covid Evaluation: Asymptomatic Screening Protocol (No Symptoms)  Diagnosis: Chest pain [194174]  Admitting Physician: Toy Baker [[id number]]  Attending Physician: Toy Baker [[id number]]           Obs      Level of  care     tele  For  24H       Lab Results  Component Value Date   [place] 07/16/2021     Precautions: admitted as asymptomatic screening protocol     PPE: Used by the provider:   N95  eye Goggles,  Gloves     Deondra Labrador 08/12/2021, 1:56 AM    [facility]     after 2 AM please page floor coverage PA If 7AM-7PM, please contact the day team taking care of the patient using [email or web address]   Patient was evaluated in the context of the global COVID-19 pandemic, which necessitated consideration that the patient might be at risk for infection with the SARS-CoV-2 virus that causes COVID-19. Institutional protocols and algorithms that pertain to the evaluation of patients at risk for COVID-19 are in a state of rapid change based on information released by regulatory bodies including the CDC and federal and state organizations. These policies and algorithms were followed during the patient's care.

## 2021-08-11 NOTE — ED Triage Notes (Signed)
Pt to triage via [facility] from home.  Reports chest pain intermittently since last night.  Last dialysis Friday.  Describes it as pulsating pain in center of chest.  Wears 3 liters [place] at home.  Denies SOB, nausea, and vomiting.  Increased generalized weakness.  Pt gray.  CPR in November during last admission.  States he needs to lay down.  Charge RN aware pt needs treatment room.  EMS- ASA 324mg  BP 135/80

## 2021-08-11 NOTE — ED Provider Notes (Signed)
Emergency Medicine Provider Triage Evaluation Note  Atlee Kluth , a 60 y.o. male  was evaluated in triage.  Pt complains of pulsating chest pain since lastn ight.  He is compliant with his dialysis per report.  He was given 324mg  of asa by EMS. Pain worsens with movement.  No shortness of breath, no N/V.   Chart review shows that he had a 2-minute PEA arrest last month when he was sedated for a dialysis fistula.    Review of Systems  Positive: See above Negative: See above  Physical Exam  Pulse 81   Temp 98.5 F (36.9 C) (Oral)   Resp (!) 22   SpO2 100%  Gen:   Awake, appears very unwell Resp:  Normal effort MSK:   Moves extremities without difficulty  Other:  Able to Doppler DP pulses in bilateral feet.  Medical Decision Making  Medically screening exam initiated at 6:24 PM.  Appropriate orders placed.  [name] was informed that the remainder of the evaluation will be completed by another provider, this initial triage assessment does not replace that evaluation, and the importance of remaining in the ED until their evaluation is complete.  Patient taken to room in main hospital.  Unable to obtain BP in triage as unable to use bilateral arms (per patient).    Lorin Glass, PA-C 08/11/21 1825    Truddie Hidden, MD 08/11/21 [phone]

## 2021-08-11 NOTE — ED Provider Notes (Signed)
Thomas Robinson  CSN: [id number] Arrival date & time: 08/11/21 1806    History Chief Complaint  Patient presents with   Chest Pain    Thomas Robinson is a 60 y.o. male with history of multiple medical problems is in general poor health, ESRD on HD was recently admitted for GI bleed in November, left AMA, he is unsure if he is still taking a blood thinner or not. At the time his dialysis catheter was removed (he has an AVF not yet ready for use). He returned 11/15 and was readmitted to have his catheter replaced for continued dialysis. He had a reported brief PEA arrest during the procedure but was ultimately discharged on 11/26. He has been compliant with dialysis since then. He last had HD on Friday, 2 days ago. Today while taking a shower (during which he had taken off his oxygen) he began to feel weak and dizzy. He was having a midsternal chest pain that ahs been alternatively described as pulsating, "like a pole sticking through my chest" and "like an elephant sitting on my chest". He has not had fever, N/V/D, he reports he feels dehydrated because his skin is flaky. He reports his sacrum is sore from sitting on it.    Past Medical History:  Diagnosis Date   Anaphylactic shock, unspecified, sequela 06/10/2019   Aortic stenosis    mild-moderate AS 12/2020 echo   Arthritis    Asthma, chronic, unspecified asthma severity, with acute exacerbation 10/23/2017   CAP (community acquired pneumonia) 10/23/2017   Carpal tunnel syndrome of right wrist 10/05/2018   Cataract    right - removed by surgery   Cerebral infarction due to thrombosis of cerebral artery (HCC)    Cervical disc herniation 01/04/2020   Chronic diastolic heart failure ([place]) 04/13/2018   Chronic low back pain 11/24/2019   Constipation    COPD (chronic obstructive pulmonary disease) (HCC)    Cough    chronic cough   Dyspnea    Encounter for immunization 07/08/2017   ESRD on hemodialysis  ([place]) 02/16/2018   Fall 06/04/2019   GERD (gastroesophageal reflux disease) 06/04/2019   GI bleeding 05/24/2017   Gram-negative sepsis, unspecified ([place]) 09/05/2017   History of fusion of cervical spine 03/26/2020   Hyperlipidemia    Hypertension    Hypokalemia 06/04/2017   Iron deficiency anemia, unspecified 09/09/2017   Left shoulder pain 11/11/2019   Lumbar radiculopathy 11/24/2019   Lung contusion 06/04/2019   LVH (left ventricular hypertrophy) due to hypertensive disease, with heart failure ([place]) 06/18/2020   Macrocytic anemia 05/24/2017   Moderate protein-calorie malnutrition ([place]) 06/06/2017   Myofascial pain syndrome 06/12/2020   Neuritis of right ulnar nerve 09/13/2018   Non-compliance with renal dialysis ([place]) 02/08/2020   Oxygen deficiency 12/30/2019   O2 sats on RA 87% at PAT appt    Pain in joint of right elbow 09/13/2018   Pulmonary hypertension ([place])    Renovascular hypertension 06/22/2020   Restless leg syndrome    Rib fractures 06/2019   Right   S/P cardiac cath 08/27/2020   normal coronary arteries.   Secondary hyperparathyroidism of renal origin ([place]) 06/04/2017   Thoracic ascending aortic aneurysm    4.5 cm 06/04/19 CT   Ulnar neuropathy 10/05/2018   Volume overload 10/23/2017    Past Surgical History:  Procedure Laterality Date   A/V FISTULAGRAM N/A 01/01/2021   Procedure: A/V FISTULAGRAM;  Surgeon: Serafina Mitchell, MD;  Location: [facility];  Service: Cardiovascular;  Laterality: N/A;   A/V FISTULAGRAM N/A 01/15/2021   Procedure: A/V [id number];  Surgeon: Serafina Mitchell, MD;  Location: [facility];  Service: Cardiovascular;  Laterality: N/A;   ANTERIOR CERVICAL DECOMP/DISCECTOMY FUSION N/A 01/04/2020   Procedure: ANTERIOR CERVICAL DECOMPRESSION/DISCECTOMY FUSION CERVICAL FIVE THROUGH SEVEN;  Surgeon: Melina Schools, MD;  Location: [facility];  Service: Orthopedics;  Laterality: N/A;  3 hrs   AV FISTULA PLACEMENT Left 05/28/2017   Procedure:  LEFT ARM ARTERIOVENOUS (AV) FISTULA CREATION;  Surgeon: Conrad Otisville, MD;  Location: [place] [facility];  Service: Vascular;  Laterality: Left;   AV FISTULA PLACEMENT Left 02/02/2021   Procedure: Debride left forearm, ligation of left upper arm Aretiovenous fistula;  Surgeon: Elam Dutch, MD;  Location: [facility];  Service: Vascular;  Laterality: Left;   AV FISTULA PLACEMENT Right 04/04/2021   Procedure: RIGHT ARTERIOVENOUS (AV) FISTULA CREATION;  Surgeon: Serafina Mitchell, MD;  Location: [facility];  Service: Vascular;  Laterality: Right;   BIOPSY  07/10/2021   Procedure: BIOPSY;  Surgeon: Lavena Bullion, DO;  Location: [facility];  Service: Gastroenterology;;   COLONOSCOPY WITH PROPOFOL N/A 07/10/2021   Procedure: COLONOSCOPY WITH PROPOFOL;  Surgeon: Lavena Bullion, DO;  Location: [facility];  Service: Gastroenterology;  Laterality: N/A;   ESOPHAGOGASTRODUODENOSCOPY (EGD) WITH PROPOFOL N/A 07/10/2021   Procedure: ESOPHAGOGASTRODUODENOSCOPY (EGD) WITH PROPOFOL;  Surgeon: Lavena Bullion, DO;  Location: [facility];  Service: Gastroenterology;  Laterality: N/A;   EYE SURGERY Right 06/02/2019   Cataract removed   FRACTURE SURGERY     INSERTION OF DIALYSIS CATHETER Right 02/02/2021   Procedure: INSERTION OF Right Internal jugular TUNNELED  DIALYSIS CATHETER;  Surgeon: Elam Dutch, MD;  Location: [facility];  Service: Vascular;  Laterality: Right;   IR DIALY SHUNT INTRO NEEDLE/INTRACATH INITIAL W/IMG LEFT Left 09/12/2020   IR FLUORO GUIDE CV LINE RIGHT  05/25/2017   IR FLUORO GUIDE CV LINE RIGHT  07/17/2021   IR US GUIDE VASC ACCESS LEFT  09/12/2020   IR US GUIDE VASC ACCESS RIGHT  05/25/2017   IR US GUIDE VASC ACCESS RIGHT  07/17/2021   LIGATION OF ARTERIOVENOUS  FISTULA Left 01/10/2021   Procedure: LIGATION OF LEFT ARM RADIOCEPHALIC FISTULA;  Surgeon: Serafina Mitchell, MD;  Location[facility];  Service: Vascular;  Laterality: Left;   PERIPHERAL VASCULAR BALLOON ANGIOPLASTY Left 01/15/2021   Procedure:  PERIPHERAL VASCULAR BALLOON ANGIOPLASTY;  Surgeon: Serafina Mitchell, MD;  Location: [facility];  Service: Cardiovascular;  Laterality: Left;  AVF   REVISON OF ARTERIOVENOUS FISTULA Left 07/18/2019   Procedure: REVISION PLICATION OF RADIOCEPHALIC ARTERIOVENOUS FISTULA LEFT ARM;  Surgeon: Angelia Mould, MD;  Location: [facility];  Service: Vascular;  Laterality: Left;   REVISON OF ARTERIOVENOUS FISTULA Left 01/10/2021   Procedure: CONVERSION TO BRACHIOCEPHALIC ARTERIOVENOUS FISTULA;  Surgeon: Serafina Mitchell, MD;  Location: [facility];  Service: Vascular;  Laterality: Left;   RINOPLASTY      Family History  Problem Relation Age of Onset   Cancer Mother     Social History   Tobacco Use   Smoking status: Never   Smokeless tobacco: Never  Vaping Use   Vaping Use: Never used  Substance Use Topics   Alcohol use: Not Currently    Alcohol/week: 0.0 standard drinks    Comment: has a drink once in a while- 12/30/19   Drug use: No     Home Medications Prior to Admission medications   Medication  Sig Start Date End Date Taking? Authorizing Provider  metoprolol tartrate (LOPRESSOR) 50 MG tablet Take 1 tablet (50 mg total) by mouth 2 (two) times daily. 06/01/21  Yes Oswald Hillock, MD  multivitamin (RENA-VIT) TABS tablet Take 1 tablet by mouth at bedtime. 01/12/18  Yes [provider]  sevelamer carbonate (RENVELA) 800 MG tablet Take 800-1,600 mg by mouth See admin instructions. Take 1-2 tablets ([id number] mg) by mouth three times daily with meals, take 1 tablet (800 mg) with snacks 05/31/21  Yes [provider]  albuterol (VENTOLIN HFA) 108 (90 Base) MCG/ACT inhaler Inhale 2 puffs into the lungs 2 (two) times daily as needed for shortness of breath or wheezing. Patient not taking: Reported on 08/11/2021 06/01/21   Oswald Hillock, MD  apixaban (ELIQUIS) 2.5 MG TABS tablet Take 1 tablet (2.5 mg total) by mouth 2 (two) times daily. Patient not taking: Reported on 07/16/2021  07/13/21 08/12/21  Darliss Cheney, MD  cinacalcet (SENSIPAR) 60 MG tablet Take 1 tablet (60 mg total) by mouth every evening. Patient not taking: Reported on 07/16/2021 06/01/21   Oswald Hillock, MD  furosemide (LASIX) 80 MG tablet Take 1 tablet (80 mg total) by mouth daily as needed for fluid (Use only on non-HD days (Tues/Thur/Sat/Sun)). Patient not taking: Reported on 08/11/2021 06/01/21   Oswald Hillock, MD     Allergies    Vancomycin   Review of Systems   Review of Systems A comprehensive review of systems was completed and negative except as noted in HPI.    Physical Exam BP (!) 128/99   Pulse 84   Temp 98.5 F (36.9 C) (Oral)   Resp (!) 21   SpO2 97%   Physical Exam Vitals and nursing Robinson reviewed.  Constitutional:      Appearance: Normal appearance.  HENT:     Head: Normocephalic and atraumatic.     Nose: Nose normal.     Mouth/Throat:     Mouth: Mucous membranes are moist.  Eyes:     Extraocular Movements: Extraocular movements intact.     Conjunctiva/sclera: Conjunctivae normal.  Cardiovascular:     Rate and Rhythm: Normal rate.  Pulmonary:     Effort: Pulmonary effort is normal.     Breath sounds: Normal breath sounds.     Comments: Dialysis catheter in R upper chest, no signs of infection Abdominal:     General: Abdomen is flat.     Palpations: Abdomen is soft.     Tenderness: There is no abdominal tenderness.  Musculoskeletal:        General: No swelling. Normal range of motion.     Cervical back: Neck supple.     Comments: AVF in RUE with palpable thrill  Skin:    General: Skin is warm and dry.  Neurological:     General: No focal deficit present.     Mental Status: He is alert.  Psychiatric:        Mood and Affect: Mood normal.     ED Results / Procedures / Treatments   Labs (all labs ordered are listed, but only abnormal results are displayed) Labs Reviewed  BASIC METABOLIC PANEL - Abnormal; Notable for the following components:       Result Value   Chloride 97 (*)    BUN 31 (*)    Creatinine, Ser 5.28 (*)    GFR, Estimated 12 (*)    Anion gap 16 (*)    All other components within normal limits  CBC WITH DIFFERENTIAL/PLATELET - Abnormal; Notable for the following components:   RBC 3.07 (*)    Hemoglobin 10.2 (*)    HCT 32.3 (*)    MCV 105.2 (*)    RDW 16.2 (*)    All other components within normal limits  TROPONIN I (HIGH SENSITIVITY) - Abnormal; Notable for the following components:   Troponin I (High Sensitivity) 98 (*)    All other components within normal limits  TROPONIN I (HIGH SENSITIVITY)    EKG EKG Interpretation  Date/Time:  Sunday August 11 2021 18:17:12 EST Ventricular Rate:  78 PR Interval:  144 QRS Duration: 82 QT Interval:  402 QTC Calculation: 458 R Axis:   117 Text Interpretation: Sinus rhythm with Premature atrial complexes Left posterior fascicular block Possible Anterior infarct , age undetermined Abnormal ECG Since last tracing Rate slower Confirmed by Calvert Cantor [phone]) on 08/11/2021 6:43:34 PM  Radiology DG Chest Port 1 View  Result Date: 08/11/2021 CLINICAL DATA:  Initial evaluation for acute chest pain, shortness of breath, weakness. End-stage renal disease on hemodialysis. EXAM: PORTABLE CHEST 1 VIEW COMPARISON:  Prior radiograph from 07/19/2021. FINDINGS: Right-sided hemodialysis catheter in place with tip overlying the cavoatrial junction. Moderate cardiomegaly, stable. Mediastinal silhouette within normal limits. Aortic atherosclerosis. Lungs normally inflated. Diffuse vascular and interstitial prominence compatible with moderate diffuse pulmonary interstitial edema. Right greater than left pleural effusions. Associated right basilar opacity likely reflects atelectasis and/or edema. No pneumothorax. No acute osseous finding.  Cervical ACDF noted. IMPRESSION: 1. Cardiomegaly with moderate diffuse pulmonary interstitial edema and right greater than left pleural effusions,  suggesting CHF. 2. Associated right basilar opacity, likely atelectasis and/or edema. Electronically Signed   By: Jeannine Boga M.D.   On: 08/11/2021 20:09    Procedures Procedures  Medications Ordered in the ED Medications - No data to display   MDM Rules/Calculators/A&P MDM Patient with multiple problems is here for chest pain and weakness. Initial BP is borderline low (he typicalyl has to have midodrine on his dialysis days) but improved with rest. Will check EKG, labs and CXR.   ED Course  I have reviewed the triage vital signs and the nursing notes.  Pertinent labs & imaging results that were available during my care of the patient were reviewed by me and considered in my medical decision making (see chart for details).  Clinical Course as of 08/11/21 2102  Sun Aug 11, 2021  1954 CBC with mild anemia similar to baseline.  [CS]  2014 CXR with edema, not hypoxic or in distress. Will likely be able to get dialysis in the AM.  [CS]  2101 Care of the patient signed out to Dr. Alvino Chapel pending labs, anticipate will need admission. [CS]    Clinical Course User Index [CS] Truddie Hidden, MD    Final Clinical Impression(s) / ED Diagnoses Final diagnoses:  None    Rx / DC Orders ED Discharge Orders     None        Truddie Hidden, MD 08/11/21 2103

## 2021-08-11 NOTE — ED Notes (Addendum)
Pt keeps ringing to the nurses station for a cup of ice and cursing at nursing staff. RN explained to pt we are waiting for lab results to come back. Pt is very vulgar, aggressive, and verbally abusive towards nursing staff.

## 2021-08-12 ENCOUNTER — Observation Stay (HOSPITAL_COMMUNITY): Payer: Medicare Other

## 2021-08-12 ENCOUNTER — Encounter (HOSPITAL_COMMUNITY): Payer: Self-pay | Admitting: Internal Medicine

## 2021-08-12 DIAGNOSIS — M1990 Unspecified osteoarthritis, unspecified site: Secondary | ICD-10-CM | POA: Diagnosis present

## 2021-08-12 DIAGNOSIS — L89159 Pressure ulcer of sacral region, unspecified stage: Secondary | ICD-10-CM | POA: Diagnosis present

## 2021-08-12 DIAGNOSIS — E785 Hyperlipidemia, unspecified: Secondary | ICD-10-CM | POA: Diagnosis present

## 2021-08-12 DIAGNOSIS — R072 Precordial pain: Secondary | ICD-10-CM | POA: Diagnosis present

## 2021-08-12 DIAGNOSIS — I200 Unstable angina: Secondary | ICD-10-CM | POA: Diagnosis not present

## 2021-08-12 DIAGNOSIS — R778 Other specified abnormalities of plasma proteins: Secondary | ICD-10-CM

## 2021-08-12 DIAGNOSIS — I480 Paroxysmal atrial fibrillation: Secondary | ICD-10-CM | POA: Diagnosis present

## 2021-08-12 DIAGNOSIS — R079 Chest pain, unspecified: Secondary | ICD-10-CM | POA: Diagnosis not present

## 2021-08-12 DIAGNOSIS — K760 Fatty (change of) liver, not elsewhere classified: Secondary | ICD-10-CM | POA: Diagnosis present

## 2021-08-12 DIAGNOSIS — Z20822 Contact with and (suspected) exposure to covid-19: Secondary | ICD-10-CM | POA: Diagnosis present

## 2021-08-12 DIAGNOSIS — Z8673 Personal history of transient ischemic attack (TIA), and cerebral infarction without residual deficits: Secondary | ICD-10-CM | POA: Diagnosis not present

## 2021-08-12 DIAGNOSIS — N186 End stage renal disease: Secondary | ICD-10-CM | POA: Diagnosis present

## 2021-08-12 DIAGNOSIS — R0789 Other chest pain: Secondary | ICD-10-CM | POA: Diagnosis not present

## 2021-08-12 DIAGNOSIS — I272 Pulmonary hypertension, unspecified: Secondary | ICD-10-CM

## 2021-08-12 DIAGNOSIS — I5023 Acute on chronic systolic (congestive) heart failure: Secondary | ICD-10-CM | POA: Diagnosis not present

## 2021-08-12 DIAGNOSIS — J9621 Acute and chronic respiratory failure with hypoxia: Secondary | ICD-10-CM | POA: Diagnosis present

## 2021-08-12 DIAGNOSIS — I132 Hypertensive heart and chronic kidney disease with heart failure and with stage 5 chronic kidney disease, or end stage renal disease: Secondary | ICD-10-CM | POA: Diagnosis present

## 2021-08-12 DIAGNOSIS — I712 Thoracic aortic aneurysm, without rupture, unspecified: Secondary | ICD-10-CM | POA: Diagnosis present

## 2021-08-12 DIAGNOSIS — J449 Chronic obstructive pulmonary disease, unspecified: Secondary | ICD-10-CM | POA: Diagnosis present

## 2021-08-12 DIAGNOSIS — R609 Edema, unspecified: Secondary | ICD-10-CM

## 2021-08-12 DIAGNOSIS — I959 Hypotension, unspecified: Secondary | ICD-10-CM | POA: Diagnosis present

## 2021-08-12 DIAGNOSIS — E1122 Type 2 diabetes mellitus with diabetic chronic kidney disease: Secondary | ICD-10-CM | POA: Diagnosis present

## 2021-08-12 DIAGNOSIS — D631 Anemia in chronic kidney disease: Secondary | ICD-10-CM | POA: Diagnosis present

## 2021-08-12 DIAGNOSIS — Z992 Dependence on renal dialysis: Secondary | ICD-10-CM | POA: Diagnosis not present

## 2021-08-12 DIAGNOSIS — I2729 Other secondary pulmonary hypertension: Secondary | ICD-10-CM | POA: Diagnosis present

## 2021-08-12 DIAGNOSIS — I2781 Cor pulmonale (chronic): Secondary | ICD-10-CM | POA: Diagnosis present

## 2021-08-12 DIAGNOSIS — G2581 Restless legs syndrome: Secondary | ICD-10-CM | POA: Diagnosis present

## 2021-08-12 DIAGNOSIS — N2581 Secondary hyperparathyroidism of renal origin: Secondary | ICD-10-CM | POA: Diagnosis present

## 2021-08-12 DIAGNOSIS — I5033 Acute on chronic diastolic (congestive) heart failure: Secondary | ICD-10-CM | POA: Diagnosis present

## 2021-08-12 DIAGNOSIS — Z8674 Personal history of sudden cardiac arrest: Secondary | ICD-10-CM | POA: Diagnosis not present

## 2021-08-12 DIAGNOSIS — Z981 Arthrodesis status: Secondary | ICD-10-CM | POA: Diagnosis not present

## 2021-08-12 LAB — BRAIN NATRIURETIC PEPTIDE: B Natriuretic Peptide: >4500 pg/mL — ABNORMAL HIGH (ref 0.0–100.0)

## 2021-08-12 LAB — PROTIME-INR
INR: 1.2 (ref 0.8–1.2)
Prothrombin Time: 14.8 seconds (ref 11.4–15.2)

## 2021-08-12 LAB — ECHOCARDIOGRAM COMPLETE
AR max vel: 1.67 cm2
AV Area VTI: 1.79 cm2
AV Area mean vel: 1.48 cm2
AV Mean grad: 15.8 mmHg
AV Peak grad: 30.2 mmHg
Ao pk vel: 2.75 m/s
Area-P 1/2: 3.08 cm2
Calc EF: 68.6 %
Height: 68 in
MV VTI: 1.81 cm2
P 1/2 time: 327 msec
S' Lateral: 2.4 cm
Single Plane A2C EF: 71.4 %
Single Plane A4C EF: 73.5 %
Weight: 2048 oz

## 2021-08-12 LAB — COMPREHENSIVE METABOLIC PANEL
ALT: 12 U/L (ref 0–44)
AST: 24 U/L (ref 15–41)
Albumin: 2.7 g/dL — ABNORMAL LOW (ref 3.5–5.0)
Alkaline Phosphatase: 124 U/L (ref 38–126)
Anion gap: 15 (ref 5–15)
BUN: 35 mg/dL — ABNORMAL HIGH (ref 8–23)
CO2: 28 mmol/L (ref 22–32)
Calcium: 10.1 mg/dL (ref 8.9–10.3)
Chloride: 98 mmol/L (ref 98–111)
Creatinine, Ser: 5.7 mg/dL — ABNORMAL HIGH (ref 0.61–1.24)
GFR, Estimated: 11 mL/min — ABNORMAL LOW (ref >60)
Glucose, Bld: 126 mg/dL — ABNORMAL HIGH (ref 70–99)
Potassium: 4.1 mmol/L (ref 3.5–5.1)
Sodium: 141 mmol/L (ref 135–145)
Total Bilirubin: 1.1 mg/dL (ref 0.3–1.2)
Total Protein: 6.3 g/dL — ABNORMAL LOW (ref 6.5–8.1)

## 2021-08-12 LAB — TROPONIN I (HIGH SENSITIVITY)
Troponin I (High Sensitivity): 77 ng/L — ABNORMAL HIGH (ref <18)
Troponin I (High Sensitivity): 91 ng/L — ABNORMAL HIGH (ref <18)

## 2021-08-12 LAB — CBC WITH DIFFERENTIAL/PLATELET
Abs Immature Granulocytes: 0.02 10*3/uL (ref 0.00–0.07)
Basophils Absolute: 0.1 10*3/uL (ref 0.0–0.1)
Basophils Relative: 2 %
Eosinophils Absolute: 0.2 10*3/uL (ref 0.0–0.5)
Eosinophils Relative: 5 %
HCT: 28.5 % — ABNORMAL LOW (ref 39.0–52.0)
Hemoglobin: 8.8 g/dL — ABNORMAL LOW (ref 13.0–17.0)
Immature Granulocytes: 0 %
Lymphocytes Relative: 23 %
Lymphs Abs: 1.1 10*3/uL (ref 0.7–4.0)
MCH: 33 pg (ref 26.0–34.0)
MCHC: 30.9 g/dL (ref 30.0–36.0)
MCV: 106.7 fL — ABNORMAL HIGH (ref 80.0–100.0)
Monocytes Absolute: 0.3 10*3/uL (ref 0.1–1.0)
Monocytes Relative: 7 %
Neutro Abs: 2.9 10*3/uL (ref 1.7–7.7)
Neutrophils Relative %: 63 %
Platelets: 219 10*3/uL (ref 150–400)
RBC: 2.67 MIL/uL — ABNORMAL LOW (ref 4.22–5.81)
RDW: 16.1 % — ABNORMAL HIGH (ref 11.5–15.5)
WBC: 4.7 10*3/uL (ref 4.0–10.5)
nRBC: 0 % (ref 0.0–0.2)

## 2021-08-12 LAB — PHOSPHORUS: Phosphorus: 6.4 mg/dL — ABNORMAL HIGH (ref 2.5–4.6)

## 2021-08-12 LAB — MAGNESIUM: Magnesium: 2.4 mg/dL (ref 1.7–2.4)

## 2021-08-12 LAB — RESP PANEL BY RT-PCR (FLU A&B, COVID) ARPGX2
Influenza A by PCR: NEGATIVE
Influenza B by PCR: NEGATIVE
SARS Coronavirus 2 by RT PCR: NEGATIVE

## 2021-08-12 LAB — HEPATITIS B SURFACE ANTIBODY,QUALITATIVE: Hep B S Ab: NONREACTIVE

## 2021-08-12 LAB — TSH: TSH: 2.694 u[IU]/mL (ref 0.350–4.500)

## 2021-08-12 LAB — HEPATITIS B SURFACE ANTIGEN: Hepatitis B Surface Ag: NONREACTIVE

## 2021-08-12 LAB — HIV ANTIBODY (ROUTINE TESTING W REFLEX): HIV Screen 4th Generation wRfx: NONREACTIVE

## 2021-08-12 LAB — LACTIC ACID, PLASMA: Lactic Acid, Venous: 1.6 mmol/L (ref 0.5–1.9)

## 2021-08-12 LAB — CK: Total CK: 15 U/L — ABNORMAL LOW (ref 49–397)

## 2021-08-12 MED ORDER — HYDROCODONE-ACETAMINOPHEN 5-325 MG PO TABS
1.0000 | ORAL_TABLET | ORAL | Status: DC | PRN
  Administered 2021-08-12: 1 via ORAL
  Filled 2021-08-12: qty 1

## 2021-08-12 MED ORDER — APIXABAN 2.5 MG PO TABS
2.5000 mg | ORAL_TABLET | Freq: Two times a day (BID) | ORAL | Status: DC
  Administered 2021-08-12 – 2021-08-13 (×3): 2.5 mg via ORAL
  Filled 2021-08-12 (×4): qty 1

## 2021-08-12 MED ORDER — SALINE SPRAY 0.65 % NA SOLN
1.0000 | NASAL | Status: DC | PRN
  Filled 2021-08-12: qty 44

## 2021-08-12 MED ORDER — HYDRALAZINE HCL 20 MG/ML IJ SOLN: 10.0000 mg | Freq: Four times a day (QID) | INTRAMUSCULAR | Status: DC | PRN

## 2021-08-12 MED ORDER — CARVEDILOL 6.25 MG PO TABS
6.2500 mg | ORAL_TABLET | Freq: Two times a day (BID) | ORAL | Status: DC
  Administered 2021-08-12 – 2021-08-13 (×2): 6.25 mg via ORAL
  Filled 2021-08-12 (×2): qty 1

## 2021-08-12 MED ORDER — SEVELAMER CARBONATE 800 MG PO TABS: 800.0000 mg | ORAL_TABLET | ORAL | Status: DC | PRN

## 2021-08-12 MED ORDER — MIDODRINE HCL 5 MG PO TABS
2.5000 mg | ORAL_TABLET | Freq: Three times a day (TID) | ORAL | Status: DC
  Administered 2021-08-12 – 2021-08-13 (×3): 2.5 mg via ORAL
  Filled 2021-08-12 (×3): qty 1

## 2021-08-12 MED ORDER — AMIODARONE HCL 200 MG PO TABS
200.0000 mg | ORAL_TABLET | Freq: Every day | ORAL | Status: DC
  Administered 2021-08-13: 200 mg via ORAL
  Filled 2021-08-12: qty 1

## 2021-08-12 MED ORDER — SODIUM CHLORIDE 0.9% FLUSH: 3.0000 mL | INTRAVENOUS | Status: DC | PRN

## 2021-08-12 MED ORDER — CHLORHEXIDINE GLUCONATE CLOTH 2 % EX PADS: 6.0000 | MEDICATED_PAD | Freq: Every day | CUTANEOUS | Status: DC

## 2021-08-12 MED ORDER — GUAIFENESIN-DM 100-10 MG/5ML PO SYRP
5.0000 mL | ORAL_SOLUTION | ORAL | Status: DC | PRN
  Administered 2021-08-12: 5 mL via ORAL
  Filled 2021-08-12: qty 5

## 2021-08-12 MED ORDER — SODIUM CHLORIDE 0.9% FLUSH
3.0000 mL | Freq: Two times a day (BID) | INTRAVENOUS | Status: DC
  Administered 2021-08-12: 3 mL via INTRAVENOUS

## 2021-08-12 MED ORDER — SODIUM CHLORIDE 0.9 % IV SOLN: 250.0000 mL | INTRAVENOUS | Status: DC | PRN

## 2021-08-12 MED ORDER — CHLORHEXIDINE GLUCONATE CLOTH 2 % EX PADS
6.0000 | MEDICATED_PAD | Freq: Every day | CUTANEOUS | Status: DC
  Administered 2021-08-13: 6 via TOPICAL

## 2021-08-12 MED ORDER — SEVELAMER CARBONATE 800 MG PO TABS
1600.0000 mg | ORAL_TABLET | Freq: Three times a day (TID) | ORAL | Status: DC
  Administered 2021-08-12 – 2021-08-13 (×3): 1600 mg via ORAL
  Filled 2021-08-12 (×3): qty 2

## 2021-08-12 MED ORDER — PANTOPRAZOLE SODIUM 40 MG PO TBEC
40.0000 mg | DELAYED_RELEASE_TABLET | Freq: Every day | ORAL | Status: DC
  Administered 2021-08-13: 40 mg via ORAL
  Filled 2021-08-12: qty 1

## 2021-08-12 MED ORDER — FENTANYL CITRATE PF 50 MCG/ML IJ SOSY: 25.0000 ug | PREFILLED_SYRINGE | INTRAMUSCULAR | Status: DC | PRN

## 2021-08-12 MED ORDER — ACETAMINOPHEN 650 MG RE SUPP: 650.0000 mg | Freq: Four times a day (QID) | RECTAL | Status: DC | PRN

## 2021-08-12 MED ORDER — HEPARIN SODIUM (PORCINE) 1000 UNIT/ML IJ SOLN
INTRAMUSCULAR | Status: AC
  Filled 2021-08-12: qty 4

## 2021-08-12 MED ORDER — HEPARIN SODIUM (PORCINE) 1000 UNIT/ML DIALYSIS: 2000.0000 [IU] | Freq: Once | INTRAMUSCULAR | Status: DC

## 2021-08-12 MED ORDER — ACETAMINOPHEN 325 MG PO TABS: 650.0000 mg | ORAL_TABLET | Freq: Four times a day (QID) | ORAL | Status: DC | PRN

## 2021-08-12 NOTE — Progress Notes (Signed)
Lower extremity venous bilateral study completed.   Please see CV Proc for preliminary results.   Aldean Suddeth, RDMS, RVT  

## 2021-08-12 NOTE — ED Notes (Addendum)
PTherapy at the bedside

## 2021-08-12 NOTE — Progress Notes (Addendum)
PROGRESS NOTE                                                                                                                                                                                                             Patient Demographics:    Thomas Robinson, is a 60 y.o. male, DOB - 1960-10-04, KJZ:[id number]  Outpatient Primary MD for the patient is [name], [place]    LOS - 0  Admit date - 08/11/2021    Chief Complaint  Patient presents with   Chest Pain       Brief Narrative (HPI from H&P)   60 y.o. male with medical history significant of ESRD on HD M,W,F,  Chronic hypoxia on 3L, asthma, CVA, chronic diastolic CHF, COPD, GERD, HLD, HTN, Thoracic ascending aortic aneurysm 4.5 cm 06/04/19 CT .  He also has recent history of PEA arrest which was thought to be due to oversedation, he has left multiple times AMA.  In the past he had chosen to be home hospice but then changed his decision.  This admission he was admitted for substernal chest discomfort along with orthopnea and shortness of breath thought to be due to fluid overload acute on chronic diastolic dysfunction in the setting of ESRD.  Of note he claims to be compliant with his dialysis treatments.   Subjective:    Layla Barter today has, No headache, +ve chest pressure and mild SOB, no weakness   Assessment  & Plan :     Acute on chronic hypoxic respiratory failure due to acute on chronic diastolic CHF EF > 94% - he has underlying ESRD and claims to be compliant, some evidence of fluid overload on exam, nephrology has been consulted for HD treatment.  Since he is having ongoing chest discomfort we will request cardiology to evaluate him as well. EKG is nonacute.  Troponin trend is reassuring.  Continue supplemental oxygen of note he uses 2 to 3 L oxygen at baseline now at 5 lits.   2.  ESRD.  On MWF schedule.  Nephrology on board  3.  Anemia of chronic disease.   Monitor no acute issues  4.  Paroxysmal atrial fibrillation.  Mali vas 2 score of greater than 3.  Placed on beta-blocker continue Eliquis  5.  Essential hypertension.  Blood pressure in poor control , placed on  Coreg will monitor.   6.  Sacral decubitus ulcers present on admission.  Kindly see wound care notes.  7.  History of thoracic aortic aneurysm.  4.5 cm in 2020.  Placed on beta-blocker outpatient follow-up with PCP and cardiothoracic surgery.  8.  COPD with severe underlying cor pulmonale.  Issues with very poor baseline, on 3 L nasal cannula oxygen at baseline and was recently referred to hospice but now chooses to be full code and aggressive treatment.       Condition - Extremely Guarded  Family Communication  :  none present  Code Status :  Full  Consults  :  Renal, Cards  PUD Prophylaxis : PPI   Procedures  :     Leg Venous US - no DVT      Disposition Plan  :    Status is: Observation  DVT Prophylaxis  :    SCDs Start: 08/12/21 0136 apixaban (ELIQUIS) tablet 2.5 mg Start: 08/12/21 0115 apixaban (ELIQUIS) tablet 2.5 mg    Lab Results  Component Value Date   PLT 219 08/12/2021    Diet :  Diet Order             Diet renal with fluid restriction Fluid restriction: 1200 mL Fluid; Room service appropriate? Yes; Fluid consistency: Thin  Diet effective now                    Inpatient Medications  Scheduled Meds:  apixaban  2.5 mg Oral BID   carvedilol  6.25 mg Oral BID WC   Chlorhexidine Gluconate Cloth  6 each Topical Q0600   heparin  2,000 Units Dialysis Once in dialysis   midodrine  2.5 mg Oral TID WC   sevelamer carbonate  1,600 mg Oral TID WC   Continuous Infusions: PRN Meds:.acetaminophen **OR** acetaminophen, fentaNYL (SUBLIMAZE) injection, guaiFENesin-dextromethorphan, hydrALAZINE, HYDROcodone-acetaminophen, sevelamer carbonate, sodium chloride  Antibiotics  :    Anti-infectives (From admission, onward)    None         Time Spent in minutes  30   Lala Lund M.D on 08/12/2021 at 11:42 AM  To page go to [email or web address]   [facility] -  Office  [phone]  See all Orders from today for further details    Objective:   Vitals:   08/12/21 0700 08/12/21 0715 08/12/21 1015 08/12/21 1100  BP: (!) 116/54 (!) 107/50 (!) 161/84   Pulse: 88 68 80 75  Resp: 17 17 19 20   Temp:      TempSrc:      SpO2: 99% 98% 97% 97%  Weight:      Height:        Wt Readings from Last 3 Encounters:  08/12/21 58.1 kg  07/27/21 58.1 kg  07/11/21 57.6 kg    No intake or output data in the 24 hours ending 08/12/21 1142   Physical Exam  Awake Alert, No new F.N deficits, right IJ tunneled HD catheter in place site appears stable, [place].AT,PERRAL Supple Neck, No JVD,   Symmetrical Chest wall movement, Good air movement bilaterally, ++ rales, RRR,No Gallops,Rubs or new Murmurs,  +ve B.Sounds, Abd Soft, No tenderness,   No Cyanosis, Clubbing or edema     RN pressure injury documentation: Pressure Injury 07/19/21 Coccyx Posterior;Medial Stage 2 -  Partial thickness loss of dermis presenting as a shallow open injury with a red, pink wound bed without slough. (Active)  07/19/21 1130  Location: Coccyx  Location Orientation: Posterior;Medial  Staging: Stage 2 -  Partial thickness loss of dermis presenting as a shallow open injury with a red, pink wound bed without slough.  Wound Description (Comments):   Present on Admission: Yes     Pressure Injury 07/23/21 Heel Left Deep Tissue Pressure Injury - Purple or maroon localized area of discolored intact skin or blood-filled blister due to damage of underlying soft tissue from pressure and/or shear. (Active)  07/23/21 2241  Location: Heel  Location Orientation: Left  Staging: Deep Tissue Pressure Injury - Purple or maroon localized area of discolored intact skin or blood-filled blister due to damage of underlying soft tissue from pressure and/or shear.  Wound  Description (Comments):   Present on Admission:      Data Review:    CBC Recent Labs  Lab 08/11/21 1837 08/12/21 0153  WBC 4.2 4.7  HGB 10.2* 8.8*  HCT 32.3* 28.5*  PLT 230 219  MCV 105.2* 106.7*  MCH 33.2 33.0  MCHC 31.6 30.9  RDW 16.2* 16.1*  LYMPHSABS 1.0 1.1  MONOABS 0.4 0.3  EOSABS 0.2 0.2  BASOSABS 0.1 0.1    Electrolytes Recent Labs  Lab 08/11/21 1837 08/12/21 0153  NA 141 141  K 3.9 4.1  CL 97* 98  CO2 28 28  GLUCOSE 86 126*  BUN 31* 35*  CREATININE 5.28* 5.70*  CALCIUM 10.3 10.1  AST  --  24  ALT  --  12  ALKPHOS  --  124  BILITOT  --  1.1  ALBUMIN  --  2.7*  MG  --  2.4  LATICACIDVEN  --  1.6  INR  --  1.2  TSH  --  2.694  BNP  --  >4,500.0*    ------------------------------------------------------------------------------------------------------------------ No results for input(s): CHOL, HDL, LDLCALC, TRIG, CHOLHDL, LDLDIRECT in the last 72 hours.  Lab Results  Component Value Date   HGBA1C 4.6 (L) 07/01/2021    Recent Labs    08/12/21 0153  TSH 2.694   ------------------------------------------------------------------------------------------------------------------ ID Labs Recent Labs  Lab 08/11/21 1837 08/12/21 0153  WBC 4.2 4.7  PLT 230 219  LATICACIDVEN  --  1.6  CREATININE 5.28* 5.70*   Cardiac Enzymes No results for input(s): CKMB, TROPONINI, MYOGLOBIN in the last 168 hours.  Invalid input(s): CK     Radiology Reports DG Chest Port 1 View  Result Date: 08/11/2021 CLINICAL DATA:  Initial evaluation for acute chest pain, shortness of breath, weakness. End-stage renal disease on hemodialysis. EXAM: PORTABLE CHEST 1 VIEW COMPARISON:  Prior radiograph from 07/19/2021. FINDINGS: Right-sided hemodialysis catheter in place with tip overlying the cavoatrial junction. Moderate cardiomegaly, stable. Mediastinal silhouette within normal limits. Aortic atherosclerosis. Lungs normally inflated. Diffuse vascular and interstitial  prominence compatible with moderate diffuse pulmonary interstitial edema. Right greater than left pleural effusions. Associated right basilar opacity likely reflects atelectasis and/or edema. No pneumothorax. No acute osseous finding.  Cervical ACDF noted. IMPRESSION: 1. Cardiomegaly with moderate diffuse pulmonary interstitial edema and right greater than left pleural effusions, suggesting CHF. 2. Associated right basilar opacity, likely atelectasis and/or edema. Electronically Signed   By: Jeannine Boga M.D.   On: 08/11/2021 20:09   VAS [place] LOWER EXTREMITY VENOUS (DVT)  Result Date: 08/12/2021  Lower Venous DVT Study Patient Name:  REDMOND WHITTLEY  Date of Exam:   08/12/2021 Medical Rec #: [id number]         Accession #:    [id number] Date of Birth: 06-Jul-1961         Patient Gender: M Patient Age:   48  years Exam Location:  [facility] Procedure:      VAS [place] LOWER EXTREMITY VENOUS (DVT) Referring Phys: Nyoka Lint DOUTOVA --------------------------------------------------------------------------------  Indications: Edema.  Risk Factors: CHF. Comparison Study: 07-01-2021 Most recent prior lower extremity venous was                   negative for DVT. Performing Technologist: Darlin Coco RDMS, RVT  Examination Guidelines: A complete evaluation includes B-mode imaging, spectral Doppler, color Doppler, and power Doppler as needed of all accessible portions of each vessel. Bilateral testing is considered an integral part of a complete examination. Limited examinations for reoccurring indications may be performed as noted. The reflux portion of the exam is performed with the patient in reverse Trendelenburg.  +---------+---------------+---------+-----------+----------+--------------+ RIGHT    CompressibilityPhasicitySpontaneityPropertiesThrombus Aging +---------+---------------+---------+-----------+----------+--------------+ CFV      Full           No       Yes                                  +---------+---------------+---------+-----------+----------+--------------+ SFJ      Full                                                        +---------+---------------+---------+-----------+----------+--------------+ FV Prox  Full                                                        +---------+---------------+---------+-----------+----------+--------------+ FV Mid   Full                                                        +---------+---------------+---------+-----------+----------+--------------+ FV DistalFull                                                        +---------+---------------+---------+-----------+----------+--------------+ PFV      Full                                                        +---------+---------------+---------+-----------+----------+--------------+ POP      Full           No       Yes                                 +---------+---------------+---------+-----------+----------+--------------+ PTV      Full                                                        +---------+---------------+---------+-----------+----------+--------------+  PERO     Full                                                        +---------+---------------+---------+-----------+----------+--------------+ Gastroc  Full                                                        +---------+---------------+---------+-----------+----------+--------------+   +---------+---------------+---------+-----------+----------+--------------+ LEFT     CompressibilityPhasicitySpontaneityPropertiesThrombus Aging +---------+---------------+---------+-----------+----------+--------------+ CFV      Full           No       Yes                                 +---------+---------------+---------+-----------+----------+--------------+ SFJ      Full                                                         +---------+---------------+---------+-----------+----------+--------------+ FV Prox  Full                                                        +---------+---------------+---------+-----------+----------+--------------+ FV Mid   Full                                                        +---------+---------------+---------+-----------+----------+--------------+ FV DistalFull                                                        +---------+---------------+---------+-----------+----------+--------------+ PFV      Full                                                        +---------+---------------+---------+-----------+----------+--------------+ POP      Full           No       Yes                                 +---------+---------------+---------+-----------+----------+--------------+ PTV      Full                                                        +---------+---------------+---------+-----------+----------+--------------+  PERO     Full                                                        +---------+---------------+---------+-----------+----------+--------------+ Gastroc  Full                                                        +---------+---------------+---------+-----------+----------+--------------+     Summary: RIGHT: - There is no evidence of deep vein thrombosis in the lower extremity.  - No cystic structure found in the popliteal fossa.  LEFT: - There is no evidence of deep vein thrombosis in the lower extremity.  - No cystic structure found in the popliteal fossa.  *See table(s) above for measurements and observations.    Preliminary

## 2021-08-12 NOTE — ED Notes (Signed)
Patient c/o nasal irritation from oxygen, requesting a "pad" for his buttocks, pain medication for body aches, and cough medications.  RN addressed patient's needs.  Patient provided with humidified oxygen to help nasal irritation.  Mepilex sacrum pad placed for comfort and per request.  Provided with warm blankets and repositioned in bed.  Call bell in reach of patient.  Admission PRN medications ordered for cough and nasal discomfort

## 2021-08-12 NOTE — Consult Note (Signed)
[facility] [place] Nurse Consult Note: Patient receiving care in [place] Reason for Consult: Sacral decub Wound type: No PI found only redness that is blanchable. Patient states it is sore and sometimes painful. He is very small and has protruding bony prominences. We will need to continue the sacral foam dressings and encourage him to turn side to side to offload pressure.   Monitor the wound area(s) for worsening of condition such as: Signs/symptoms of infection, increase in size, development of or worsening of odor, development of pain, or increased pain at the affected locations.   Notify the medical team if any of these develop.  Thank you for the consult. [facility] [place] nurse will not follow at this time.   Please re-consult the [place] team if needed.  Cathlean Marseilles Tamala Julian, MSN, RN, [place], Lysle Pearl, [facility] Wound Treatment Associate Pager [phone]

## 2021-08-12 NOTE — ED Notes (Signed)
Echo in progress at bedside. Report given to hemodialysis RN, patient to be transported to HD following completion of echo.

## 2021-08-12 NOTE — Plan of Care (Signed)

## 2021-08-12 NOTE — Consult Note (Addendum)
Cardiology Consultation:   Patient ID: Thomas Robinson MRN: [id number]; DOB: 1961/04/30  Admit date: 08/11/2021 Date of Consult: 08/12/2021  PCP:  Thomas Niece, PA   [facility] Providers Cardiologist:  [name], DO        Patient Profile:   Thomas Robinson is a 60 y.o. male with a hx of  of chronic hypoxic respiratory failure on 2 L nasal cannula, chronic diastolic heart failure, severe pulm hypertension/cor pulmonale, ESRD on HD, hyperlipidemia, IDA, RLS, thoracic ascending aortic aneurysm, hypertension, gastritis and atrial flutter and 2 recent hospitalizations in Nov 2022 who is being seen 08/12/2021 for the evaluation of chest pain at the request of Dr. Candiss Norse.  History of Present Illness:   [name] with above hx and last admit 11/15/-07/27/21 for cardiac arrest with PEA arrest lasting 2 min with CPR, after receiving low dose sedation, uncontrolled a flutter, initially eliquis was stopped for GI bleeding and then to be resumed but pt left hospital AMA.   ESRD on HD MWF.  On home 02 at 3L.    Recent tests TTE 07/19/2021  1. Near cavity obliteration. There is an intracavitary gradient. Peak  velocity 3.58 m/s. Peak gradient 51.3 mmHg. Left ventricular ejection  fraction, by estimation, is >75%. The left ventricle has hyperdynamic  function. The left ventricle has no  regional wall motion abnormalities.   2. Right ventricle is heavily trabeculated. Right ventricular systolic  function is severely reduced. There is severely elevated pulmonary artery  systolic pressure.   3. Agitated saline contrast bubble study was negative, with no evidence  of any interatrial shunt.   4. A small pericardial effusion is present.   5. Moderate to severe mitral annular calcification.   6. Thickening of the mitral valve leaflets. Tricuspid valve regurgitation  is moderate to severe.   7. The inferior vena cava is dilated in size with <50% respiratory  variability, suggesting right  atrial pressure of 15 mmHg.    Echo 07/02/2021: Hyperdynamic LV function, 70 to 75%, severe RV dilation with severely reduced RV function RUQ [place] 07/04/2021: Hepatic steatosis Cardiac Cath 08/27/2020 [facility]): Normal coronaries, Normal LVEDP PFT (06/26/2020 [facility]): Severe obstructive ventilatory defect  CT PE 06/30/2021: No PE V/Q: Negative   Now admitted 08/11/21 by EMS with chest pain, intermittently since last night.  Pulsating pain in center of chest.  Last dialysis on Friday.   Pt was taking a shower without his 02, he became weak and dizzy.  + midsternal chest pain that feels pulsating, both like a pole through chest and like an elephant sitting on chest.    EKG:  The EKG was personally reviewed and demonstrates:  SR with PAC LPFB, QTc 490 Telemetry:  Telemetry was personally reviewed and demonstrates:  SR with PACs  Na 141, K+ 4.1 glucose 126, BUN 35, Cr 5.70 albumin 2.7  BNP >4,500 Hs troponin 98 ; 93; 77;91  CK 15; Hgb 10.2, WBC 4.2, plts 230 have been down to 116 on last hospitalization.  -Hgb now 8.8  plts 219  PCXR 08/11/21 IMPRESSION: 1. Cardiomegaly with moderate diffuse pulmonary interstitial edema and right greater than left pleural effusions, suggesting CHF. 2. Associated right basilar opacity, likely atelectasis and/or edema.  BP 161/84 P 80 R 22-19  Now mild chest pressure  Past Medical History:  Diagnosis Date   Anaphylactic shock, unspecified, sequela 06/10/2019   Aortic stenosis    mild-moderate AS 12/2020 echo   Arthritis    Asthma, chronic, unspecified asthma severity,  with acute exacerbation 10/23/2017   CAP (community acquired pneumonia) 10/23/2017   Carpal tunnel syndrome of right wrist 10/05/2018   Cataract    right - removed by surgery   Cerebral infarction due to thrombosis of cerebral artery (HCC)    Cervical disc herniation 01/04/2020   Chronic diastolic heart failure ([place]) 04/13/2018   Chronic low back pain 11/24/2019   Constipation    COPD  (chronic obstructive pulmonary disease) (HCC)    Cough    chronic cough   Dyspnea    Encounter for immunization 07/08/2017   ESRD on hemodialysis ([place]) 02/16/2018   Fall 06/04/2019   GERD (gastroesophageal reflux disease) 06/04/2019   GI bleeding 05/24/2017   Gram-negative sepsis, unspecified ([place]) 09/05/2017   History of fusion of cervical spine 03/26/2020   Hyperlipidemia    Hypertension    Hypokalemia 06/04/2017   Iron deficiency anemia, unspecified 09/09/2017   Left shoulder pain 11/11/2019   Lumbar radiculopathy 11/24/2019   Lung contusion 06/04/2019   LVH (left ventricular hypertrophy) due to hypertensive disease, with heart failure ([place]) 06/18/2020   Macrocytic anemia 05/24/2017   Moderate protein-calorie malnutrition ([place]) 06/06/2017   Myofascial pain syndrome 06/12/2020   Neuritis of right ulnar nerve 09/13/2018   Non-compliance with renal dialysis ([place]) 02/08/2020   Oxygen deficiency 12/30/2019   O2 sats on RA 87% at PAT appt    Pain in joint of right elbow 09/13/2018   Pulmonary hypertension ([place])    Renovascular hypertension 06/22/2020   Restless leg syndrome    Rib fractures 06/2019   Right   S/P cardiac cath 08/27/2020   normal coronary arteries.   Secondary hyperparathyroidism of renal origin ([place]) 06/04/2017   Thoracic ascending aortic aneurysm    4.5 cm 06/04/19 CT   Ulnar neuropathy 10/05/2018   Volume overload 10/23/2017    Past Surgical History:  Procedure Laterality Date   A/V FISTULAGRAM N/A 01/01/2021   Procedure: A/V FISTULAGRAM;  Surgeon: Serafina Mitchell, MD;  Location: [place] [facility];  Service: Cardiovascular;  Laterality: N/A;   A/V FISTULAGRAM N/A 01/15/2021   Procedure: A/V [id number];  Surgeon: Serafina Mitchell, MD;  Location: [place] [facility];  Service: Cardiovascular;  Laterality: N/A;   ANTERIOR CERVICAL DECOMP/DISCECTOMY FUSION N/A 01/04/2020   Procedure: ANTERIOR CERVICAL DECOMPRESSION/DISCECTOMY FUSION CERVICAL FIVE THROUGH SEVEN;   Surgeon: Melina Schools, MD;  Location: [place];  Service: Orthopedics;  Laterality: N/A;  3 hrs   AV FISTULA PLACEMENT Left 05/28/2017   Procedure: LEFT ARM ARTERIOVENOUS (AV) FISTULA CREATION;  Surgeon: Conrad Clarkston, MD;  Location: [place];  Service: Vascular;  Laterality: Left;   AV FISTULA PLACEMENT Left 02/02/2021   Procedure: Debride left forearm, ligation of left upper arm Aretiovenous fistula;  Surgeon: Elam Dutch, MD;  Location: [place];  Service: Vascular;  Laterality: Left;   AV FISTULA PLACEMENT Right 04/04/2021   Procedure: RIGHT ARTERIOVENOUS (AV) FISTULA CREATION;  Surgeon: Serafina Mitchell, MD;  Location: [place] [facility];  Service: Vascular;  Laterality: Right;   BIOPSY  07/10/2021   Procedure: BIOPSY;  Surgeon: Lavena Bullion, DO;  Location: [place] [facility];  Service: Gastroenterology;;   COLONOSCOPY WITH PROPOFOL N/A 07/10/2021   Procedure: COLONOSCOPY WITH PROPOFOL;  Surgeon: Lavena Bullion, DO;  Location: [place];  Service: Gastroenterology;  Laterality: N/A;   ESOPHAGOGASTRODUODENOSCOPY (EGD) WITH PROPOFOL N/A 07/10/2021   Procedure: ESOPHAGOGASTRODUODENOSCOPY (EGD) WITH PROPOFOL;  Surgeon: Lavena Bullion, DO;  Location: [place];  Service: Gastroenterology;  Laterality: N/A;  EYE SURGERY Right 06/02/2019   Cataract removed   FRACTURE SURGERY     INSERTION OF DIALYSIS CATHETER Right 02/02/2021   Procedure: INSERTION OF Right Internal jugular TUNNELED  DIALYSIS CATHETER;  Surgeon: Elam Dutch, MD;  Location: [place];  Service: Vascular;  Laterality: Right;   IR DIALY SHUNT INTRO NEEDLE/INTRACATH INITIAL W/IMG LEFT Left 09/12/2020   IR FLUORO GUIDE CV LINE RIGHT  05/25/2017   IR FLUORO GUIDE CV LINE RIGHT  07/17/2021   IR US GUIDE VASC ACCESS LEFT  09/12/2020   IR US GUIDE VASC ACCESS RIGHT  05/25/2017   IR US GUIDE VASC ACCESS RIGHT  07/17/2021   LIGATION OF ARTERIOVENOUS  FISTULA Left 01/10/2021   Procedure: LIGATION OF LEFT ARM RADIOCEPHALIC FISTULA;  Surgeon: Serafina Mitchell, MD;  Location: [place];  Service: Vascular;  Laterality: Left;   PERIPHERAL VASCULAR BALLOON ANGIOPLASTY Left 01/15/2021   Procedure: PERIPHERAL VASCULAR BALLOON ANGIOPLASTY;  Surgeon: Serafina Mitchell, MD;  Location: [place] [facility];  Service: Cardiovascular;  Laterality: Left;  AVF   REVISON OF ARTERIOVENOUS FISTULA Left 07/18/2019   Procedure: REVISION PLICATION OF RADIOCEPHALIC ARTERIOVENOUS FISTULA LEFT ARM;  Surgeon: Angelia Mould, MD;  Location: [place];  Service: Vascular;  Laterality: Left;   REVISON OF ARTERIOVENOUS FISTULA Left 01/10/2021   Procedure: CONVERSION TO BRACHIOCEPHALIC ARTERIOVENOUS FISTULA;  Surgeon: Serafina Mitchell, MD;  Location: [facility];  Service: Vascular;  Laterality: Left;   RINOPLASTY       Home Medications:  Prior to Admission medications   Medication Sig Start Date End Date Taking? Authorizing Provider  metoprolol tartrate (LOPRESSOR) 50 MG tablet Take 1 tablet (50 mg total) by mouth 2 (two) times daily. 06/01/21  Yes Oswald Hillock, MD  multivitamin (RENA-VIT) TABS tablet Take 1 tablet by mouth at bedtime. 01/12/18  Yes [provider]  sevelamer carbonate (RENVELA) 800 MG tablet Take 800-1,600 mg by mouth See admin instructions. Take 1-2 tablets ([phone] mg) by mouth three times daily with meals, take 1 tablet (800 mg) with snacks 05/31/21  Yes [provider]  albuterol (VENTOLIN HFA) 108 (90 Base) MCG/ACT inhaler Inhale 2 puffs into the lungs 2 (two) times daily as needed for shortness of breath or wheezing. Patient not taking: Reported on 08/11/2021 06/01/21   Oswald Hillock, MD  apixaban (ELIQUIS) 2.5 MG TABS tablet Take 1 tablet (2.5 mg total) by mouth 2 (two) times daily. Patient not taking: Reported on 07/16/2021 07/13/21 08/12/21  Darliss Cheney, MD  cinacalcet (SENSIPAR) 60 MG tablet Take 1 tablet (60 mg total) by mouth every evening. Patient not taking: Reported on 07/16/2021 06/01/21   Oswald Hillock, MD  furosemide (LASIX)  80 MG tablet Take 1 tablet (80 mg total) by mouth daily as needed for fluid (Use only on non-HD days (Tues/Thur/Sat/Sun)). Patient not taking: Reported on 08/11/2021 06/01/21   Oswald Hillock, MD    Inpatient Medications: Scheduled Meds:  apixaban  2.5 mg Oral BID   Chlorhexidine Gluconate Cloth  6 each Topical Q0600   midodrine  2.5 mg Oral TID WC   sevelamer carbonate  1,600 mg Oral TID WC   sodium chloride flush  3 mL Intravenous Q12H   Continuous Infusions:  sodium chloride     PRN Meds: sodium chloride, acetaminophen **OR** acetaminophen, fentaNYL (SUBLIMAZE) injection, guaiFENesin-dextromethorphan, HYDROcodone-acetaminophen, sevelamer carbonate, sodium chloride, sodium chloride flush  Allergies:    Allergies  Allergen Reactions   Vancomycin Shortness Of Breath and Itching  Social History:   Social History   Socioeconomic History   Marital status: Single    Spouse name: Not on file   Number of children: Not on file   Years of education: Not on file   Highest education level: Not on file  Occupational History   Not on file  Tobacco Use   Smoking status: Never   Smokeless tobacco: Never  Vaping Use   Vaping Use: Never used  Substance and Sexual Activity   Alcohol use: Not Currently    Alcohol/week: 0.0 standard drinks    Comment: has a drink once in a while- 12/30/19   Drug use: No   Sexual activity: Not Currently  Other Topics Concern   Not on file  Social History Narrative   Lives with a roommate in a one story home.  Has 1 child.  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  Education: high school.   Social Determinants of Health   Financial Resource Strain: Not on file  Food Insecurity: Not on file  Transportation Needs: Not on file  Physical Activity: Not on file  Stress: Not on file  Social Connections: Not on file  Intimate Partner Violence: Not on file    Family History:    Family History  Problem Relation Age of Onset   Cancer Mother      ROS:   Please see the history of present illness.  General:no colds or fevers, no weight changes Skin:no rashes or ulcers HEENT:no blurred vision, no congestion CV:see HPI PUL:see HPI, hx Asthma, COPD GI:no diarrhea constipation or melena, no indigestion recent GI bleed anticoagulation held GU:no hematuria, no dysuria MS:no joint pain, no claudication Neuro:no syncope, no lightheadedness, hx CVA Endo:no diabetes, no thyroid disease Nephrology: ESRD on HD MWF  All other ROS reviewed and negative.     Physical Exam/Data:   Vitals:   08/12/21 0654 08/12/21 0700 08/12/21 0715 08/12/21 1015  BP: (!) 103/53 (!) 116/54 (!) 107/50 (!) 161/84  Pulse: 67 88 68 80  Resp: 20 17 17 19   Temp:      TempSrc:      SpO2: 100% 99% 98% 97%  Weight:      Height:       No intake or output data in the 24 hours ending 08/12/21 1048 Last 3 Weights 08/12/2021 07/27/2021 07/26/2021  Weight (lbs) 128 lb 128 lb 1.4 oz 129 lb 13.6 oz  Weight (kg) 58.06 kg 58.1 kg 58.9 kg     Body mass index is 19.46 kg/m.  General:  Well nourished, well developed, in no acute distress HEENT: normal Neck: no JVD Vascular: No carotid bruits; Distal pulses 2+ bilaterally Cardiac:  normal S1, S2; RRR; no murmur gallup rub or click Lungs:  diminished with rales in base to auscultation bilaterally, no wheezing, rhonchi  Abd: soft, nontender, no hepatomegaly  Ext: no edema to mild edema of feet Musculoskeletal:  No deformities, BUE and BLE strength normal and equal Skin: warm and dry  Neuro:  alert and oriented X 3 MAE follows commands, no focal abnormalities noted Psych:  Normal affect   Relevant CV Studies: See above in HPI  Echo scheduled for today   Laboratory Data:  High Sensitivity Troponin:   Recent Labs  Lab 07/19/21 1100 08/11/21 1837 08/11/21 2037 08/12/21 0153 08/12/21 0430  TROPONINIHS 168* 98* 93* 77* 91*     Chemistry Recent Labs  Lab 08/11/21 1837 08/12/21 0153  NA 141 141  K 3.9 4.1  CL  97* 98  CO2 28 28  GLUCOSE 86 126*  BUN 31* 35*  CREATININE 5.28* 5.70*  CALCIUM 10.3 10.1  MG  --  2.4  GFRNONAA 12* 11*  ANIONGAP 16* 15    Recent Labs  Lab 08/12/21 0153  PROT 6.3*  ALBUMIN 2.7*  AST 24  ALT 12  ALKPHOS 124  BILITOT 1.1   Lipids No results for input(s): CHOL, TRIG, HDL, LABVLDL, LDLCALC, CHOLHDL in the last 168 hours.  Hematology Recent Labs  Lab 08/11/21 1837 08/12/21 0153  WBC 4.2 4.7  RBC 3.07* 2.67*  HGB 10.2* 8.8*  HCT 32.3* 28.5*  MCV 105.2* 106.7*  MCH 33.2 33.0  MCHC 31.6 30.9  RDW 16.2* 16.1*  PLT 230 219   Thyroid  Recent Labs  Lab 08/12/21 0153  TSH 2.694    BNP Recent Labs  Lab 08/12/21 0153  BNP >4,500.0*    DDimer No results for input(s): DDIMER in the last 168 hours.   Radiology/Studies:  DG Chest Port 1 View  Result Date: 08/11/2021 CLINICAL DATA:  Initial evaluation for acute chest pain, shortness of breath, weakness. End-stage renal disease on hemodialysis. EXAM: PORTABLE CHEST 1 VIEW COMPARISON:  Prior radiograph from 07/19/2021. FINDINGS: Right-sided hemodialysis catheter in place with tip overlying the cavoatrial junction. Moderate cardiomegaly, stable. Mediastinal silhouette within normal limits. Aortic atherosclerosis. Lungs normally inflated. Diffuse vascular and interstitial prominence compatible with moderate diffuse pulmonary interstitial edema. Right greater than left pleural effusions. Associated right basilar opacity likely reflects atelectasis and/or edema. No pneumothorax. No acute osseous finding.  Cervical ACDF noted. IMPRESSION: 1. Cardiomegaly with moderate diffuse pulmonary interstitial edema and right greater than left pleural effusions, suggesting CHF. 2. Associated right basilar opacity, likely atelectasis and/or edema. Electronically Signed   By: Jeannine Boga M.D.   On: 08/11/2021 20:09     Assessment and Plan:   Chest pain with elevated troponin, on CTA of chest to R/O PE, found to have  extensive multi vessel coronary artery calcification,  and thoracic aorta is dilated in ascending segment at 4.2 cm in greatest dimension-prior heart cath at [facility] 2021 with normal coronary arteries. Chest pain may be due to CHF. For dialysis today   PAF had to stop anticoagulation with GI bleed was not on eliquis on arrival now he is on eliquis.  No meds prescribed when he left AMA 07/27/21-  he was in a flutter last admit  RV heart failure on echo 07/19/21 with LV Near cavity obliteration. There is an intracavitary gradient. Peak  velocity 3.58 m/s. Peak gradient 51.3 mmHg. Left ventricular ejection fraction, by estimation, is >75%. The left ventricle has hyperdynamic function.  Mod to severe TR, mitral valve  Moderate to severe mitral annular calcification. Mod AS and mild AR.  Severe pulmonary HTN/cor pulmonale felt secondary to severe COPD.   ESRD on HD per renal Hx of non compliance at one point was to go to Hospice but then refused.     Risk Assessment/Risk Scores:     HEAR Score (for undifferentiated chest pain):  HEAR Score: 5  New York Heart Association (NYHA) Functional Class NYHA Class II  CHA2DS2-VASc Score = 5   This indicates a 7.2% annual risk of stroke. The patient's score is based upon: CHF History: 1 HTN History: 1 Diabetes History: 0 Stroke History: 2 Vascular Disease History: 1 Age Score: 0 Gender Score: 0         For questions or updates, please contact [place] Please  consult [email or web address] for contact info under    Signed, Cecilie Kicks, NP  08/12/2021 10:48 AM

## 2021-08-12 NOTE — Progress Notes (Signed)
ANTICOAGULATION CONSULT NOTE - Initial Consult  Pharmacy Consult for Eliquis Indication:  Aflutter  Allergies  Allergen Reactions   Vancomycin Shortness Of Breath and Itching    Patient Measurements: Height: 5\' 8"  (172.7 cm) Weight: 58.1 kg (128 lb) IBW/kg (Calculated) : 68.4  Vital Signs: Temp: 98.5 F (36.9 C) (12/11 1822) Temp Source: Oral (12/11 1822) BP: 109/46 (12/11 2200) Pulse Rate: 75 (12/11 2200)  Labs: Recent Labs    08/11/21 1837 08/11/21 2037  HGB 10.2*  --   HCT 32.3*  --   PLT 230  --   CREATININE 5.28*  --   TROPONINIHS 98* 93*    Estimated Creatinine Clearance: 11.9 mL/min (A) (by C-G formula based on SCr of 5.28 mg/dL (H)).   Medical History: Past Medical History:  Diagnosis Date   Anaphylactic shock, unspecified, sequela 06/10/2019   Aortic stenosis    mild-moderate AS 12/2020 echo   Arthritis    Asthma, chronic, unspecified asthma severity, with acute exacerbation 10/23/2017   CAP (community acquired pneumonia) 10/23/2017   Carpal tunnel syndrome of right wrist 10/05/2018   Cataract    right - removed by surgery   Cerebral infarction due to thrombosis of cerebral artery (HCC)    Cervical disc herniation 01/04/2020   Chronic diastolic heart failure ([place]) 04/13/2018   Chronic low back pain 11/24/2019   Constipation    COPD (chronic obstructive pulmonary disease) (HCC)    Cough    chronic cough   Dyspnea    Encounter for immunization 07/08/2017   ESRD on hemodialysis ([place]) 02/16/2018   Fall 06/04/2019   GERD (gastroesophageal reflux disease) 06/04/2019   GI bleeding 05/24/2017   Gram-negative sepsis, unspecified ([place]) 09/05/2017   History of fusion of cervical spine 03/26/2020   Hyperlipidemia    Hypertension    Hypokalemia 06/04/2017   Iron deficiency anemia, unspecified 09/09/2017   Left shoulder pain 11/11/2019   Lumbar radiculopathy 11/24/2019   Lung contusion 06/04/2019   LVH (left ventricular hypertrophy) due to  hypertensive disease, with heart failure ([place]) 06/18/2020   Macrocytic anemia 05/24/2017   Moderate protein-calorie malnutrition ([place]) 06/06/2017   Myofascial pain syndrome 06/12/2020   Neuritis of right ulnar nerve 09/13/2018   Non-compliance with renal dialysis ([place]) 02/08/2020   Oxygen deficiency 12/30/2019   O2 sats on RA 87% at PAT appt    Pain in joint of right elbow 09/13/2018   Pulmonary hypertension ([place])    Renovascular hypertension 06/22/2020   Restless leg syndrome    Rib fractures 06/2019   Right   S/P cardiac cath 08/27/2020   normal coronary arteries.   Secondary hyperparathyroidism of renal origin ([place]) 06/04/2017   Thoracic ascending aortic aneurysm    4.5 cm 06/04/19 CT   Ulnar neuropathy 10/05/2018   Volume overload 10/23/2017    Assessment: 60yo male was dx'd w/ Aflutter in Nov 2022 and started on Eliquis though pt has repeatedly stopped taking the Eliquis after leaving the hospital AMA multiple times in the past 1-2 months.   Plan:  Eliquis 2.5mg  PO BID.  Wynona Neat, PharmD, BCPS  08/12/2021,12:58 AM

## 2021-08-12 NOTE — Progress Notes (Signed)
Received report from dayshift RN that patient had made remarks of leaving AMA, but has since chosen to stay. When attempting to do shift assessment patient states "put this (side rail) down and get me a walker, I am not a 59 year old." Advised patient that it is for his safety while in the hospital as he has shown to be unsteady on his feet. Patient stated that since we have not given him a walker, he will use the bathroom in the bed all night. Patient also removed all Heart monitor and pulse-ox leads and stated he would not be wearing them all night. Stated "I am leaving in the morning and they better not try to stop me." MD and charge RN made aware. Continue with POC at this time.

## 2021-08-12 NOTE — Progress Notes (Signed)
Asked to see pt for dialysis. Pt is on OBV status. If pt admitted as full inpatient will do formal consultation. Otherwise orders written for HD today upstairs.     OP HD:  [facility] MWF    3.5h  53.5kg  2/2 bath  P2  Hep none TDC    -mircera 150ug last 12/07    - venofer 50 weekly       [name], MD 08/12/2021, 11:25 AM

## 2021-08-12 NOTE — Procedures (Signed)
   I was present at this dialysis session, have reviewed the session itself and made  appropriate changes [name] MD [place] pager [phone]   08/12/2021, 1:30 PM

## 2021-08-12 NOTE — Evaluation (Signed)
Occupational Therapy Evaluation Patient Details Name: Thomas Robinson MRN: [id number] DOB: 02/18/1961 Today's Date: 08/12/2021   History of Present Illness Pt is 60 y/o M admitted 12/11 with chest pain. Found to have sacral wound and LE edema. PMH includes HTN, chronic diastolic heart failure , ESRD on HD   Clinical Impression   Pt presents with general weakness. Limited eval due to pt refusing any mobility and agitated on arrival. Pt reports that he is modified independent at baseline and that he is near his baseline now. Also reporting he could mobilize with RW and complete ADLs without assistance if he wanted to. Suspect pt may need more assistance than he is stating, however difficult to determine due to refusal of mobility/functional activity. Pt may benefit from continued skilled OT services, however declining at this time and stating he will be going home after he receives HD. Will sign off.      Recommendations for follow up therapy are one component of a multi-disciplinary discharge planning process, led by the attending physician.  Recommendations may be updated based on patient status, additional functional criteria and insurance authorization.   Follow Up Recommendations  No OT follow up    Assistance Recommended at Discharge Intermittent Supervision/Assistance  Functional Status Assessment  Patient has had a recent decline in their functional status and demonstrates the ability to make significant improvements in function in a reasonable and predictable amount of time.  Equipment Recommendations  None recommended by OT    Recommendations for Other Services       Precautions / Restrictions Precautions Precautions: Fall Precaution Comments: Monitor O2 Restrictions Weight Bearing Restrictions: No      Mobility Bed Mobility Overal bed mobility: Needs Assistance Bed Mobility: Rolling Rolling: Supervision         General bed mobility comments: Pt refusing to sit at  EOB, however observed rolling and elevating trunk to come into long sitting with relative ease.    Transfers                   General transfer comment: Pt refused, stating "I'm not standing up until I'm walking out of this place." Reports he could stand and walk using RW without assistance if he wanted to.      Balance Overall balance assessment: Needs assistance Sitting-balance support: No upper extremity supported Sitting balance-Leahy Scale: Good Sitting balance - Comments: Good balance in long sitting                                   ADL either performed or assessed with clinical judgement   ADL Overall ADL's : Needs assistance/impaired Eating/Feeding: Independent   Grooming: Set up;Sitting   Upper Body Bathing: Set up;Sitting   Lower Body Bathing: Sitting/lateral leans;Set up   Upper Body Dressing : Set up;Sitting   Lower Body Dressing: Set up;Sitting/lateral leans                 General ADL Comments: Difficult to determine as pt declining mobility during eval. Pt states he is primarily independent at baseline and feels he is near his baseline for occupational performance at this time.     Vision Patient Visual Report: No change from baseline       Perception     Praxis      Pertinent Vitals/Pain Pain Assessment: No/denies pain     Hand Dominance Right   Extremity/Trunk Assessment Upper Extremity  Assessment Upper Extremity Assessment: Generalized weakness   Lower Extremity Assessment Lower Extremity Assessment: Defer to PT evaluation   Cervical / Trunk Assessment Cervical / Trunk Assessment: Kyphotic   Communication Communication Communication: No difficulties   Cognition Arousal/Alertness: Awake/alert Behavior During Therapy: WFL for tasks assessed/performed Overall Cognitive Status: Within Functional Limits for tasks assessed                                 General Comments: Pt irritable and  agitated when OT arrived, removing leads, saying "Tell the doctor to give me my papers - I'm getting out of here." Became more amicable throughout eval.     General Comments  SpO2 92% at rest on 3L. Pt on 2.5L at home.    Exercises     Shoulder Instructions      Home Living Family/patient expects to be discharged to:: Private residence Living Arrangements: Non-relatives/Friends Available Help at Discharge: Friend(s) Type of Home: House Home Access: Stairs to enter [employer] of Steps: 2 Entrance Stairs-Rails: None Home Layout: One level     Bathroom Shower/Tub: Teacher, early years/pre: Standard     Home Equipment: Rollator (4 wheels);Cane - single point;Shower seat          Prior Functioning/Environment Prior Level of Function : Independent/Modified Independent;Driving                        OT Problem List: Decreased strength      OT Treatment/Interventions:      OT Goals(Current goals can be found in the care plan section) Acute Rehab OT Goals Patient Stated Goal: return home OT Goal Formulation: With patient  OT Frequency:     Barriers to D/C:            Co-evaluation              AM-PAC OT "6 Clicks" Daily Activity     Outcome Measure Help from another person eating meals?: None Help from another person taking care of personal grooming?: A Little Help from another person toileting, which includes using toliet, bedpan, or urinal?: A Little Help from another person bathing (including washing, rinsing, drying)?: A Little Help from another person to put on and taking off regular upper body clothing?: A Little Help from another person to put on and taking off regular lower body clothing?: A Little 6 Click Score: 19   End of Session Nurse Communication: Mobility status  Activity Tolerance: Patient tolerated treatment well Patient left: in bed;with call bell/phone within reach  OT Visit Diagnosis: Muscle weakness  (generalized) (M62.81)                Time: 0981-1914 OT Time Calculation (min): 18 min Charges:  OT General Charges $OT Visit: 1 Visit OT Evaluation $OT Eval Low Complexity: 1 Low  Jackalyn Haith C, OT/L  Acute Rehab [place] 08/12/2021, 8:46 AM

## 2021-08-12 NOTE — ED Notes (Signed)
Breakfast order placed ?

## 2021-08-12 NOTE — Progress Notes (Signed)
  Echocardiogram 2D Echocardiogram has been performed.  Thomas Robinson 08/12/2021, 12:03 PM

## 2021-08-12 NOTE — Progress Notes (Signed)
08/12/21 0936  PT Visit Information  Last PT Received On 08/12/21  Assistance Needed +1  History of Present Illness Pt is 60 y/o M admitted 12/11 with chest pain. Found to have sacral wound and LE edema. PMH includes HTN, chronic diastolic heart failure , ESRD on HD  Precautions  Precautions Fall  Precaution Comments Monitor O2  Restrictions  Weight Bearing Restrictions No  Home Living  Family/patient expects to be discharged to: Private residence  Living Arrangements Non-relatives/Friends  Available Help at Discharge Friend(s)  Type of [place] to enter  Entrance Stairs-Number of Steps 2  Entrance Stairs-Rails None  Home Layout One level  Bathroom Shower/Tub Tub/shower unit  Environmental education officer (4 wheels);Cane - single point;Shower seat  Prior Function  Prior Level of Function  Needs assist  Mobility Comments Rollator at times; caregiver assists with ambulation  ADLs Comments Needs some assist with washing back  Communication  Communication No difficulties  Pain Assessment  Pain Assessment No/denies pain  Cognition  Arousal/Alertness Awake/alert  Behavior During Therapy WFL for tasks assessed/performed  Overall Cognitive Status Within Functional Limits for tasks assessed  General Comments Can be easily irritated  Upper Extremity Assessment  Upper Extremity Assessment Defer to OT evaluation  Lower Extremity Assessment  Lower Extremity Assessment Generalized weakness (increased swelling noted bilaterally)  Bed Mobility  Overal bed mobility Needs Assistance  Bed Mobility Rolling  Rolling Supervision  General bed mobility comments Pt refusing OOB mobility, but was able to roll for repositioning.  Transfers  General transfer comment Refused  Exercises  Exercises General Lower Extremity  General Exercises - Lower Extremity  Ankle Circles/Pumps AROM;Both;10 reps  Heel Slides AROM;Both;5 reps  Straight Leg Raises  AROM;Both;5 reps  PT - End of Session  Equipment Utilized During Treatment Oxygen  Activity Tolerance Patient limited by fatigue  Patient left in bed;with call bell/phone within reach (on stretcher in ED)  Nurse Communication Mobility status  PT Assessment  PT Recommendation/Assessment Patient needs continued PT services  PT Visit Diagnosis Muscle weakness (generalized) (M62.81);Difficulty in walking, not elsewhere classified (R26.2)  PT Problem List Decreased strength;Decreased balance;Decreased mobility;Decreased activity tolerance;Decreased knowledge of use of DME;Decreased knowledge of precautions;Decreased safety awareness  PT Plan  PT Frequency (ACUTE ONLY) Min 3X/week  PT Treatment/Interventions (ACUTE ONLY) DME instruction;Functional mobility training;Balance training;Patient/family education;Therapeutic activities;Gait training;Therapeutic exercise  AM-PAC PT "6 Clicks" Mobility Outcome Measure (Version 2)  Help needed turning from your back to your side while in a flat bed without using bedrails? 4  Help needed moving from lying on your back to sitting on the side of a flat bed without using bedrails? 3  Help needed moving to and from a bed to a chair (including a wheelchair)? 3  Help needed standing up from a chair using your arms (e.g., wheelchair or bedside chair)? 3  Help needed to walk in hospital room? 2  Help needed climbing 3-5 steps with a railing?  2  6 Click Score 17  Consider Recommendation of Discharge To: Home with [facility]  Progressive Mobility  What is the highest level of mobility based on the progressive mobility assessment? Level 3 (Stands with assist) - Balance while standing  and cannot march in place  Mobility Out of bed to chair with meals;Out of bed for toileting  PT Recommendation  Follow Up Recommendations Home health PT (pt adamant about returning home)  Assistance recommended at discharge Frequent or constant Supervision/Assistance  Functional Status  Assessment Patient has had a recent decline in their functional status and demonstrates the ability to make significant improvements in function in a reasonable and predictable amount of time.  PT equipment None recommended by PT  Individuals Consulted  Consulted and Agree with Results and Recommendations Patient  Acute Rehab PT Goals  Patient Stated Goal to go home  PT Goal Formulation With patient  Time For Goal Achievement 08/26/21  Potential to Achieve Goals Fair  PT Time Calculation  PT Start Time (ACUTE ONLY) 0933  PT Stop Time (ACUTE ONLY) 0945  PT Time Calculation (min) (ACUTE ONLY) 12 min  PT General Charges  $$ ACUTE PT VISIT 1 Visit  PT Evaluation  $PT Eval Moderate Complexity 1 Mod  Written Expression  Dominant Hand Right   Pt admitted secondary to problem above with deficits above. Pt refusing OOB mobility this session, but was agreeable to HEP. Reviewed LE HEP with pt. Reports he has a friend and caregiver that comes to help with mobility and ADLs. Pt adamant about returning home, so recommending HHPT at d/c. Will continue to follow acutely.   Reuel Derby, PT, DPT  Acute Rehabilitation Services  Pager: [phone] Office: [phone]

## 2021-08-13 ENCOUNTER — Encounter: Payer: Medicare Other | Admitting: Vascular Surgery

## 2021-08-13 DIAGNOSIS — I200 Unstable angina: Secondary | ICD-10-CM

## 2021-08-13 NOTE — Discharge Instructions (Addendum)
Follow with Primary MD [name], PA in 7 days   Get CBC, CMP, 2 view Chest X ray -  checked next visit within 1 week by Primary MD   Activity: As tolerated with Full fall precautions use walker/cane & assistance as needed  Disposition Home   Diet: Renal diet with 1.2 L fluid restriction per day.  Special Instructions: If you have smoked or chewed Tobacco  in the last 2 yrs please stop smoking, stop any regular Alcohol  and or any Recreational drug use.  On your next visit with your primary care physician please Get Medicines reviewed and adjusted.  Please request your Prim.MD to go over all Hospital Tests and Procedure/Radiological results at the follow up, please get all Hospital records sent to your Prim MD by signing hospital release before you go home.  If you experience worsening of your admission symptoms, develop shortness of breath, life threatening emergency, suicidal or homicidal thoughts you must seek medical attention immediately by calling 911 or calling your MD immediately  if symptoms less severe.  You Must read complete instructions/literature along with all the possible adverse reactions/side effects for all the Medicines you take and that have been prescribed to you. Take any new Medicines after you have completely understood and accpet all the possible adverse reactions/side effects.        Information on my medicine - ELIQUIS (apixaban)  This medication education was reviewed with me or my healthcare representative as part of my discharge preparation.  Why was Eliquis prescribed for you? Eliquis was prescribed for you to reduce the risk of a blood clot forming that can cause a stroke if you have a medical condition called atrial fibrillation (a type of irregular heartbeat).  What do You need to know about Eliquis ? Take your Eliquis TWICE DAILY - one tablet in the morning and one tablet in the evening with or without food. If you have difficulty  swallowing the tablet whole please discuss with your pharmacist how to take the medication safely.  Take Eliquis exactly as prescribed by your doctor and DO NOT stop taking Eliquis without talking to the doctor who prescribed the medication.  Stopping may increase your risk of developing a stroke.  Refill your prescription before you run out.  After discharge, you should have regular check-up appointments with your healthcare provider that is prescribing your Eliquis.  In the future your dose may need to be changed if your kidney function or weight changes by a significant amount or as you get older.  What do you do if you miss a dose? If you miss a dose, take it as soon as you remember on the same day and resume taking twice daily.  Do not take more than one dose of ELIQUIS at the same time to make up a missed dose.  Important Safety Information A possible side effect of Eliquis is bleeding. You should call your healthcare provider right away if you experience any of the following: Bleeding from an injury or your nose that does not stop. Unusual colored urine (red or dark brown) or unusual colored stools (red or black). Unusual bruising for unknown reasons. A serious fall or if you hit your head (even if there is no bleeding).  Some medicines may interact with Eliquis and might increase your risk of bleeding or clotting while on Eliquis. To help avoid this, consult your healthcare provider or pharmacist prior to using any new prescription or non-prescription medications, including herbals, vitamins,  non-steroidal anti-inflammatory drugs (NSAIDs) and supplements.  This website has more information on Eliquis (apixaban): http://www.eliquis.com/eliquis/home

## 2021-08-13 NOTE — Progress Notes (Signed)
Patient refused all bedside care. Medication given at bedside patient in return threw medication. Refused VS. MD and CN notified by this RN. MD put in discharge orders per patient request. Patient numerous statement of he was leaving. Iv's removed removed. Patient's belongings gathered and placed in personal belongings bag. No complaints or concerns stated at this time.

## 2021-08-13 NOTE — Discharge Summary (Signed)
Thomas Robinson DOB: [birth year] DOA: 08/11/2021  PCP: [name], PA  Admit date: 08/11/2021  Discharge date: 08/13/2021  Admitted From: Home   Disposition:  Home   Recommendations for Outpatient Follow-up:   Follow up with PCP in 1-2 weeks  PCP Please obtain BMP/CBC, 2 view CXR in 1week,  (see Discharge instructions)   PCP Please follow up on the following pending results:    Home Health: None   Equipment/Devices: None  Consultations: Renal, Cards Discharge Condition: Stable    CODE STATUS: Full    Diet Recommendation: Renal diet with 1.2 L fluid restriction per day.    Chief Complaint  Patient presents with   Chest Pain     Brief history of present illness from the day of admission and additional interim summary    60 y.o. male with medical history significant of ESRD on HD M,W,F,  Chronic hypoxia on 3L, asthma, CVA, chronic diastolic CHF, COPD, GERD, HLD, HTN, Thoracic ascending aortic aneurysm 4.5 cm 06/04/19 CT .  He also has recent history of PEA arrest which was thought to be due to oversedation, he has left multiple times AMA.  In the past he had chosen to be home hospice but then changed his decision.  This admission he was admitted for substernal chest discomfort along with orthopnea and shortness of breath thought to be due to fluid overload acute on chronic diastolic dysfunction in the setting of ESRD.  Of note he claims to be compliant with his dialysis treatments.                                                                 Hospital Course   Acute on chronic hypoxic respiratory failure due to acute on chronic diastolic CHF EF > 62% - he has underlying ESRD and claims to be compliant, he had evidence of volume overload and was requiring 5 L of nasal cannula oxygen up from  his baseline 3 L, he was seen by nephrology and cardiology, EKG and troponin were reassuring however he did have some chest pressure and discomfort along with orthopnea.  He was urgently dialyzed fluid was removed, feels better today currently on 3 L nasal cannula oxygen and refuses to stay any longer for any evaluation or management.  Will be discharged home with outpatient cardiology and rheumatology follow-up.  He has already refused vital signs this morning and threatened to leave AMA if not discharged right away.  Kindly see nursing notes.     2.  ESRD.  On MWF schedule.  Nephrology on board, was dialyzed on 08/04/2021.   3.  Anemia of chronic disease.  Monitor no acute issues   4.  Paroxysmal atrial fibrillation.  Mali vas 2 score of greater than 3.  Placed on beta-blocker continue Eliquis  continue upon discharge.   5.  Essential hypertension.  Continue home regimen.  Does not want any medications to be changed.   6.  Sacral decubitus ulcers present on admission.  Kindly see wound care notes.   7.  History of thoracic aortic aneurysm.  4.5 cm in 2020.  Placed on beta-blocker outpatient follow-up with PCP and cardiothoracic surgery.   8.  COPD with severe underlying cor pulmonale.  Issues with very poor baseline, on 3 L nasal cannula oxygen at baseline and was recently referred to hospice but now chooses to be full code and aggressive treatment, somewhat unrealistic as once he is symptomatically better he refuses treatment and refuses to stay further in the hospital and threatens to leave AMA.   Discharge diagnosis     Principal Problem:   Chest pain Active Problems:   Essential hypertension   Atypical chest pain   ESRD on dialysis (HCC)   Chronic diastolic heart failure (HCC)   Acute exacerbation of congestive heart failure (HCC)   Anemia in ESRD (end-stage renal disease) (HCC)   Hypertension   Elevated troponin   Sacral pressure sore    Discharge instructions    Discharge  Instructions     Discharge instructions   Complete by: As directed    Follow with Primary MD [name], PA in 7 days   Get CBC, CMP, 2 view Chest X ray -  checked next visit within 1 week by Primary MD   Activity: As tolerated with Full fall precautions use walker/cane & assistance as needed  Disposition Home   Diet: Renal diet with 1.2 L fluid restriction per day.  Special Instructions: If you have smoked or chewed Tobacco  in the last 2 yrs please stop smoking, stop any regular Alcohol  and or any Recreational drug use.  On your next visit with your primary care physician please Get Medicines reviewed and adjusted.  Please request your Prim.MD to go over all Hospital Tests and Procedure/Radiological results at the follow up, please get all Hospital records sent to your Prim MD by signing hospital release before you go home.  If you experience worsening of your admission symptoms, develop shortness of breath, life threatening emergency, suicidal or homicidal thoughts you must seek medical attention immediately by calling 911 or calling your MD immediately  if symptoms less severe.  You Must read complete instructions/literature along with all the possible adverse reactions/side effects for all the Medicines you take and that have been prescribed to you. Take any new Medicines after you have completely understood and accpet all the possible adverse reactions/side effects.   Discharge wound care:   Complete by: As directed    Keep your right chest dialysis catheter site clean and dry at all times.  Continue sacral foam dressings and turn side to side to offload pressure from bony prominences.   Increase activity slowly   Complete by: As directed        Discharge Medications   Allergies as of 08/13/2021       Reactions   Vancomycin Shortness Of Breath, Itching        Medication List     TAKE these medications    albuterol 108 (90 Base) MCG/ACT inhaler Commonly  known as: VENTOLIN HFA Inhale 2 puffs into the lungs 2 (two) times daily as needed for shortness of breath or wheezing.   apixaban 2.5 MG Tabs tablet Commonly known as: ELIQUIS Take 1 tablet (2.5 mg total) by mouth 2 (two)  times daily.   cinacalcet 60 MG tablet Commonly known as: SENSIPAR Take 1 tablet (60 mg total) by mouth every evening.   furosemide 80 MG tablet Commonly known as: LASIX Take 1 tablet (80 mg total) by mouth daily as needed for fluid (Use only on non-HD days (Tues/Thur/Sat/Sun)).   metoprolol tartrate 50 MG tablet Commonly known as: LOPRESSOR Take 1 tablet (50 mg total) by mouth 2 (two) times daily.   multivitamin Tabs tablet Take 1 tablet by mouth at bedtime.   sevelamer carbonate 800 MG tablet Commonly known as: RENVELA Take 800-1,600 mg by mouth See admin instructions. Take 1-2 tablets [phone] mg) by mouth three times daily with meals, take 1 tablet (800 mg) with snacks               Discharge Care Instructions  (From admission, onward)           Start     Ordered   08/13/21 0000  Discharge wound care:       Comments: Keep your right chest dialysis catheter site clean and dry at all times.  Continue sacral foam dressings and turn side to side to offload pressure from bony prominences.   08/13/21 0950             Follow-up Information     Tobb, Kardie, DO Follow up on 09/17/2021.   Specialty: Cardiology Why: at 8:20 AM Contact information: [address] [place] [address] [phone]         [name], [place]. Schedule an appointment as soon as possible for a visit in 1 week(s).   Specialty: Physician Assistant Contact information: [place] [address] [phone]         [name], DO .   Specialty: Cardiology Contact information: [place] [address] [place] [address] [phone]                 Major procedures and Radiology  Reports - PLEASE review detailed and final reports thoroughly  -         DG Chest 2 View  Result Date: 07/16/2021 CLINICAL DATA:  Chest pain EXAM: CHEST - 2 VIEW COMPARISON:  07/07/2021 FINDINGS: Interval removal of hemodialysis catheter. Stable cardiomegaly. Aortic atherosclerosis. Pulmonary vascular congestion with predominantly interstitial opacities throughout both lungs. Small right pleural effusion. No pneumothorax. IMPRESSION: Findings suggestive of CHF with pulmonary edema and small right pleural effusion. Electronically Signed   By: Davina Poke D.O.   On: 07/16/2021 09:55   DG Abd 1 View  Result Date: 07/20/2021 CLINICAL DATA:  Enteric catheter placement EXAM: ABDOMEN - 1 VIEW COMPARISON:  07/01/2021, 07/19/2021 FINDINGS: Frontal view of the lower chest and upper abdomen demonstrates enteric catheter tip and side port projecting over the gastric fundus. External defibrillator pad, left internal jugular catheter, and right internal jugular dialysis catheter are stable. Visualized bowel gas pattern is unremarkable. Bibasilar consolidation and effusions are stable. IMPRESSION: 1. Enteric catheter tip projecting over the gastric fundus. 2. Persistent bibasilar consolidation and effusions. Electronically Signed   By: Randa Ngo M.D.   On: 07/20/2021 20:17   IR Fluoro Guide CV Line Right  Result Date: 07/17/2021 INDICATION: ESRD requiring HD. EXAM: TUNNELED CENTRAL VENOUS HEMODIALYSIS CATHETER PLACEMENT WITH ULTRASOUND AND FLUOROSCOPIC GUIDANCE MEDICATIONS: Vancomycin 1 gm IV . The antibiotic was given in an appropriate time interval prior to skin puncture. ANESTHESIA/SEDATION: Moderate (conscious) sedation was employed during this procedure. A total of  Versed 1 mg and Fentanyl 25 mcg was administered intravenously. Moderate Sedation Time: 15 minutes. The patient's level of consciousness and vital signs were monitored continuously by radiology nursing throughout the procedure under my  direct supervision. FLUOROSCOPY TIME:  0 minutes 6 seconds (1 mGy). COMPLICATIONS: None immediate. PROCEDURE: Informed written consent was obtained from the patient after a discussion of the risks, benefits, and alternatives to treatment. Questions regarding the procedure were encouraged and answered. The RIGHT neck and chest were prepped with chlorhexidine in a sterile fashion, and a sterile drape was applied covering the operative field. Maximum barrier sterile technique with sterile gowns and gloves were used for the procedure. A timeout was performed prior to the initiation of the procedure. After creating a small venotomy incision, a micropuncture kit was utilized to access the internal jugular vein. Real-time ultrasound guidance was utilized for vascular access including the acquisition of a permanent ultrasound image documenting patency of the accessed vessel. The microwire was utilized to measure appropriate catheter length. A stiff Glidewire was advanced to the level of the IVC and the micropuncture sheath was exchanged for a peel-away sheath. A hemosplit tunneled hemodialysis catheter measuring 23 cm from tip to cuff was tunneled in a retrograde fashion from the anterior chest wall to the venotomy incision. The catheter was then placed through the peel-away sheath with tips ultimately positioned within the superior aspect of the right atrium. Final catheter positioning was confirmed and documented with a spot radiographic image. The catheter aspirates and flushes normally. The catheter was flushed with appropriate volume heparin dwells. The catheter exit site was secured with a 0-silk retention suture. The venotomy incision was closed with an interrupted 2-0 Vicryl, then Dermabond was applied at the skin. Dressings were applied. The patient tolerated the procedure well without immediate post procedural complication. IMPRESSION: Successful placement of 23 cm tunneled hemodialysis catheter via the RIGHT  internal jugular vein with tip terminating within the proximal right atrium. The catheter is ready for immediate use. Michaelle Birks, MD [facility] Radiology Electronically Signed   By: Michaelle Birks M.D.   On: 07/17/2021 17:59   IR US Guide Vasc Access Right  Result Date: 07/17/2021 INDICATION: ESRD requiring HD. EXAM: TUNNELED CENTRAL VENOUS HEMODIALYSIS CATHETER PLACEMENT WITH ULTRASOUND AND FLUOROSCOPIC GUIDANCE MEDICATIONS: Vancomycin 1 gm IV . The antibiotic was given in an appropriate time interval prior to skin puncture. ANESTHESIA/SEDATION: Moderate (conscious) sedation was employed during this procedure. A total of Versed 1 mg and Fentanyl 25 mcg was administered intravenously. Moderate Sedation Time: 15 minutes. The patient's level of consciousness and vital signs were monitored continuously by radiology nursing throughout the procedure under my direct supervision. FLUOROSCOPY TIME:  0 minutes 6 seconds (1 mGy). COMPLICATIONS: None immediate. PROCEDURE: Informed written consent was obtained from the patient after a discussion of the risks, benefits, and alternatives to treatment. Questions regarding the procedure were encouraged and answered. The RIGHT neck and chest were prepped with chlorhexidine in a sterile fashion, and a sterile drape was applied covering the operative field. Maximum barrier sterile technique with sterile gowns and gloves were used for the procedure. A timeout was performed prior to the initiation of the procedure. After creating a small venotomy incision, a micropuncture kit was utilized to access the internal jugular vein. Real-time ultrasound guidance was utilized for vascular access including the acquisition of a permanent ultrasound image documenting patency of the accessed vessel. The microwire was utilized to measure appropriate catheter length. A stiff Glidewire  was advanced to the level of the IVC and the micropuncture  sheath was exchanged for a peel-away sheath. A hemosplit tunneled hemodialysis catheter measuring 23 cm from tip to cuff was tunneled in a retrograde fashion from the anterior chest wall to the venotomy incision. The catheter was then placed through the peel-away sheath with tips ultimately positioned within the superior aspect of the right atrium. Final catheter positioning was confirmed and documented with a spot radiographic image. The catheter aspirates and flushes normally. The catheter was flushed with appropriate volume heparin dwells. The catheter exit site was secured with a 0-silk retention suture. The venotomy incision was closed with an interrupted 2-0 Vicryl, then Dermabond was applied at the skin. Dressings were applied. The patient tolerated the procedure well without immediate post procedural complication. IMPRESSION: Successful placement of 23 cm tunneled hemodialysis catheter via the RIGHT internal jugular vein with tip terminating within the proximal right atrium. The catheter is ready for immediate use. Michaelle Birks, MD [facility] Radiology Electronically Signed   By: Michaelle Birks M.D.   On: 07/17/2021 17:59   DG Chest Port 1 View  Result Date: 08/11/2021 CLINICAL DATA:  Initial evaluation for acute chest pain, shortness of breath, weakness. End-stage renal disease on hemodialysis. EXAM: PORTABLE CHEST 1 VIEW COMPARISON:  Prior radiograph from 07/19/2021. FINDINGS: Right-sided hemodialysis catheter in place with tip overlying the cavoatrial junction. Moderate cardiomegaly, stable. Mediastinal silhouette within normal limits. Aortic atherosclerosis. Lungs normally inflated. Diffuse vascular and interstitial prominence compatible with moderate diffuse pulmonary interstitial edema. Right greater than left pleural effusions. Associated right basilar opacity likely reflects atelectasis and/or edema. No pneumothorax. No acute osseous finding.   Cervical ACDF noted. IMPRESSION: 1. Cardiomegaly with moderate diffuse pulmonary interstitial edema and right greater than left pleural effusions, suggesting CHF. 2. Associated right basilar opacity, likely atelectasis and/or edema. Electronically Signed   By: Jeannine Boga M.D.   On: 08/11/2021 20:09   DG CHEST PORT 1 VIEW  Result Date: 07/19/2021 CLINICAL DATA:  Central line placement EXAM: PORTABLE CHEST 1 VIEW COMPARISON:  07/19/2021 FINDINGS: New left IJ central line tip overlies central SVC. Endotracheal tube, enteric tube, and right dialysis catheter again identified. No pneumothorax. Likely layering pleural effusion on the right. Patchy increased density bilaterally similar to prior. Stable cardiomediastinal contours. IMPRESSION: New left IJ central line tip overlies SVC.  No pneumothorax. Persistent patchy bilateral opacities and layering right pleural effusion. Electronically Signed   By: Macy Mis M.D.   On: 07/19/2021 12:16   DG Chest Port 1 View  Result Date: 07/19/2021 CLINICAL DATA:  Endotracheal tube placement. EXAM: PORTABLE CHEST 1 VIEW COMPARISON:  Limb 1522 FINDINGS: Endotracheal tube tip is 3.6 cm above the base of the carina. Right IJ dialysis catheter tip overlies the SVC/RA junction. No pneumothorax. Tiny bilateral pleural effusions again noted with basilar atelectasis or infiltrate. Diffuse underlying interstitial opacity again noted. The cardio pericardial silhouette is enlarged. Telemetry leads overlie the chest. IMPRESSION: 1. Endotracheal tube tip 3.6 cm above the base of the carina. 2. Tiny bilateral pleural effusions with basilar atelectasis or infiltrate, similar to the prior. No evidence for pneumothorax. Electronically Signed   By: Misty Stanley M.D.   On: 07/19/2021 11:21   VAS US DUPLEX DIALYSIS ACCESS (AVF, AVG)  Result Date: 07/18/2021 DIALYSIS ACCESS Patient Name:  PRUITT TABOADA  Date of Exam:   07/18/2021 Medical Rec #: [id number]         Accession  #:  [id number] Date of Birth: February 05, 1961         Patient Gender: M Patient Age:   47 years Exam [facility]  [facility] Procedure:      VAS US DUPLEX DIALYSIS ACCESS (AVF, AVG) Referring Phys: Risa Grill --------------------------------------------------------------------------------  Reason for Exam: Evaluation of stage one brachial-basilic AVF, patient lost to                  follow up after creation. Access Site: Right Upper Extremity. Access Type: Basilic vein transposition, first stage. History: 04-04-2021 Right brachio-basilic AVF creation.          02-02-2021 Ligation of left upper arm AVF.          01-10-2021 Ligation of left arm radiocephalic fistula, conversion to          brachiocephalic. Comparison Study: No recent prior studies. Performing Technologist: Darlin Coco RDMS, RVT  Examination Guidelines: A complete evaluation includes B-mode imaging, spectral Doppler, color Doppler, and power Doppler as needed of all accessible portions of each vessel. Unilateral testing is considered an integral part of a complete examination. Limited examinations for reoccurring indications may be performed as noted.  Findings: +--------------------+----------+-----------------+--------+  AVF                  PSV (cm/s) Flow Vol (mL/min) Comments  +--------------------+----------+-----------------+--------+  Native artery inflow    154            389                  +--------------------+----------+-----------------+--------+  AVF Anastomosis         560                                 +--------------------+----------+-----------------+--------+  +------------+----------+-------------+----------+--------+  OUTFLOW VEIN PSV (cm/s) Diameter (cm) Depth (cm) Describe  +------------+----------+-------------+----------+--------+  Prox UA          49         0.94         0.81              +------------+----------+-------------+----------+--------+  Mid UA          327         0.44         0.68               +------------+----------+-------------+----------+--------+  Dist UA         593         0.34         0.27              +------------+----------+-------------+----------+--------+   Summary: Patent stage one right arm brachial-basilic fistula. Refer to above tables for additional data.  *See table(s) above for measurements and observations.  Diagnosing physician: Orlie Pollen Electronically signed by Orlie Pollen on 07/18/2021 at 7:04:59 PM.    --------------------------------------------------------------------------------   Final    ECHOCARDIOGRAM COMPLETE  Result Date: 08/12/2021    ECHOCARDIOGRAM LIMITED REPORT   Patient Name:   ARMOUR VILLANUEVA Date of Exam: 08/12/2021 Medical Rec #:  [id number]        Height:       68.0 in Accession #:    [id number]       Weight:       128.0 lb Date of Birth:  12/10/1960        BSA:  1.691 m Patient Age:    69 years         BP:           162/91 mmHg Patient Gender: M                HR:           84 bpm. Exam Location:  Inpatient Procedure: 2D Echo, 3D Echo, Cardiac Doppler and Color Doppler Indications:    R07.9* Chest pain, unspecified  History:        Patient has prior history of Echocardiogram examinations. CHF,                 Abnormal ECG, Stroke, Arrythmias:Atrial Flutter,                 Signs/Symptoms:Chest Pain, Dyspnea and Shortness of Breath; Risk                 Factors:Hypertension and Dyslipidemia. Hypoxia.  Sonographer:    Roseanna Rainbow RDCS Referring Phys: [place]  1. LVOT outflow obstruction. Left ventricular ejection fraction, by estimation, is 60 to 65%. The left ventricle has normal function. There is severe left ventricular hypertrophy. Left ventricular diastolic parameters are consistent with Grade I diastolic dysfunction (impaired relaxation).  2. The right ventricular size is mildly enlarged. Mildly increased right ventricular wall thickness. There is severely elevated pulmonary artery systolic pressure.  3. Left  atrial size was severely dilated.  4. Right atrial size was severely dilated.  5. A small pericardial effusion is present. There is no evidence of cardiac tamponade.  6. No evidence of mitral valve regurgitation.  7. Tricuspid valve regurgitation is mild to moderate.  8. The aortic valve is calcified. Aortic valve regurgitation is mild to moderate.  9. Aortic Small ascending aortic aneurysm 4.2 cm. 10. The inferior vena cava is dilated in size with <50% respiratory variability, suggesting right atrial pressure of 15 mmHg. Conclusion(s)/Recommendation(s): Compared to prior echo 07/19/2021, no significant differences. FINDINGS  Left Ventricle: LVOT outflow obstruction. Left ventricular ejection fraction, by estimation, is 60 to 65%. The left ventricle has normal function. There is severe left ventricular hypertrophy. Left ventricular diastolic parameters are consistent with Grade I diastolic dysfunction (impaired relaxation). Right Ventricle: The right ventricular size is mildly enlarged. Mildly increased right ventricular wall thickness. There is severely elevated pulmonary artery systolic pressure. The tricuspid regurgitant velocity is 3.77 m/s, and with an assumed right atrial pressure of 15 mmHg, the estimated right ventricular systolic pressure is 54.2 mmHg. Left Atrium: Left atrial size was severely dilated. Right Atrium: Right atrial size was severely dilated. Pericardium: A small pericardial effusion is present. There is no evidence of cardiac tamponade. Mitral Valve: There is severe calcification of the mitral valve leaflet(s). MV peak gradient, 13.0 mmHg. The mean mitral valve gradient is 5.0 mmHg. Tricuspid Valve: Tricuspid valve regurgitation is mild to moderate. Aortic Valve: The aortic valve is calcified. Aortic valve regurgitation is mild to moderate. Aortic regurgitation PHT measures 327 msec. Aortic valve mean gradient measures 15.8 mmHg. Aortic valve peak gradient measures 30.2 mmHg. Aortic valve  area, by VTI measures 1.79 cm. Pulmonic Valve: Pulmonic valve regurgitation is mild. Aorta: Small ascending aortic aneurysm 4.2 cm. Venous: The inferior vena cava is dilated in size with less than 50% respiratory variability, suggesting right atrial pressure of 15 mmHg. IAS/Shunts: No atrial level shunt detected by color flow Doppler. LEFT VENTRICLE PLAX 2D LVIDd:         3.80  cm     Diastology LVIDs:         2.40 cm     LV e' medial:    5.66 cm/s LV PW:         1.20 cm     LV E/e' medial:  20.1 LV IVS:        1.60 cm     LV e' lateral:   5.55 cm/s LVOT diam:     1.90 cm     LV E/e' lateral: 20.5 LV SV:         87 LV SV Index:   51 LVOT Area:     2.84 cm                             3D Volume EF: LV Volumes (MOD)           3D EF:        71 % LV vol d, MOD A2C: 77.9 ml LV EDV:       96 ml LV vol d, MOD A4C: 62.0 ml LV ESV:       28 ml LV vol s, MOD A2C: 22.3 ml LV SV:        68 ml LV vol s, MOD A4C: 16.4 ml LV SV MOD A2C:     55.6 ml LV SV MOD A4C:     62.0 ml LV SV MOD BP:      53.1 ml RIGHT VENTRICLE             IVC RV S prime:     13.90 cm/s  IVC diam: 2.55 cm TAPSE (M-mode): 1.4 cm LEFT ATRIUM              Index        RIGHT ATRIUM           Index LA diam:        3.60 cm  2.13 cm/m   RA Area:     23.90 cm LA Vol (A2C):   106.0 ml 62.70 ml/m  RA Volume:   81.00 ml  47.91 ml/m LA Vol (A4C):   85.0 ml  50.28 ml/m LA Biplane Vol: 105.0 ml 62.11 ml/m  AORTIC VALVE AV Area (Vmax):    1.67 cm AV Area (Vmean):   1.48 cm AV Area (VTI):     1.79 cm AV Vmax:           274.75 cm/s AV Vmean:          182.500 cm/s AV VTI:            0.485 m AV Peak Grad:      30.2 mmHg AV Mean Grad:      15.8 mmHg LVOT Vmax:         162.00 cm/s LVOT Vmean:        95.200 cm/s LVOT VTI:          0.307 m LVOT/AV VTI ratio: 0.63 AI PHT:            327 msec  AORTA Ao Root diam: 3.40 cm Ao Asc diam:  4.20 cm MITRAL VALVE                TRICUSPID VALVE MV Area (PHT): 3.08 cm     TR Peak grad:   56.9 mmHg MV Area VTI:   1.81 cm     TR  Vmax:  377.00 cm/s MV Peak grad:  13.0 mmHg MV Mean grad:  5.0 mmHg     SHUNTS MV Vmax:       1.80 m/s     Systemic VTI:  0.31 m MV Vmean:      105.0 cm/s   Systemic Diam: 1.90 cm MV Decel Time: 246 msec MV E velocity: 114.00 cm/s MV A velocity: 164.00 cm/s MV E/A ratio:  0.70 Landscape architect signed by Phineas Inches Signature Date/Time: 08/12/2021/5:05:32 PM    Final    VAS [place] LOWER EXTREMITY VENOUS (DVT)  Result Date: 08/12/2021  Lower Venous DVT Study Patient Name:  BILLEY WOJCIAK  Date of Exam:   08/12/2021 Medical Rec #: [id number]         Accession #:    [id number] Date of Birth: 1961-07-20         Patient Gender: M Patient Age:   26 years Exam Location:  [facility] Procedure:      VAS [place] LOWER EXTREMITY VENOUS (DVT) Referring Phys: Nyoka Lint DOUTOVA --------------------------------------------------------------------------------  Indications: Edema.  Risk Factors: CHF. Comparison Study: 07-01-2021 Most recent prior lower extremity venous was                   negative for DVT. Performing Technologist: Darlin Coco RDMS, RVT  Examination Guidelines: A complete evaluation includes B-mode imaging, spectral Doppler, color Doppler, and power Doppler as needed of all accessible portions of each vessel. Bilateral testing is considered an integral part of a complete examination. Limited examinations for reoccurring indications may be performed as noted. The reflux portion of the exam is performed with the patient in reverse Trendelenburg.  +---------+---------------+---------+-----------+----------+--------------+  RIGHT     Compressibility Phasicity Spontaneity Properties Thrombus Aging  +---------+---------------+---------+-----------+----------+--------------+  CFV       Full            No        Yes                                    +---------+---------------+---------+-----------+----------+--------------+  SFJ       Full                                                              +---------+---------------+---------+-----------+----------+--------------+  FV Prox   Full                                                             +---------+---------------+---------+-----------+----------+--------------+  FV Mid    Full                                                             +---------+---------------+---------+-----------+----------+--------------+  FV Distal Full                                                             +---------+---------------+---------+-----------+----------+--------------+  PFV       Full                                                             +---------+---------------+---------+-----------+----------+--------------+  POP       Full            No        Yes                                    +---------+---------------+---------+-----------+----------+--------------+  PTV       Full                                                             +---------+---------------+---------+-----------+----------+--------------+  PERO      Full                                                             +---------+---------------+---------+-----------+----------+--------------+  Gastroc   Full                                                             +---------+---------------+---------+-----------+----------+--------------+   +---------+---------------+---------+-----------+----------+--------------+  LEFT      Compressibility Phasicity Spontaneity Properties Thrombus Aging  +---------+---------------+---------+-----------+----------+--------------+  CFV       Full            No        Yes                                    +---------+---------------+---------+-----------+----------+--------------+  SFJ       Full                                                             +---------+---------------+---------+-----------+----------+--------------+  FV Prox   Full                                                              +---------+---------------+---------+-----------+----------+--------------+  FV Mid    Full                                                             +---------+---------------+---------+-----------+----------+--------------+  FV Distal Full                                                             +---------+---------------+---------+-----------+----------+--------------+  PFV       Full                                                             +---------+---------------+---------+-----------+----------+--------------+  POP       Full            No        Yes                                    +---------+---------------+---------+-----------+----------+--------------+  PTV       Full                                                             +---------+---------------+---------+-----------+----------+--------------+  PERO      Full                                                             +---------+---------------+---------+-----------+----------+--------------+  Gastroc   Full                                                             +---------+---------------+---------+-----------+----------+--------------+     Summary: RIGHT: - There is no evidence of deep vein thrombosis in the lower extremity.  - No cystic structure found in the popliteal fossa.  LEFT: - There is no evidence of deep vein thrombosis in the lower extremity.  - No cystic structure found in the popliteal fossa.  *See table(s) above for measurements and observations. Electronically signed by Servando Snare MD on 08/12/2021 at 4:19:34 PM.    Final    ECHOCARDIOGRAM LIMITED  Result Date: 07/19/2021    ECHOCARDIOGRAM LIMITED REPORT   Patient Name:   EMAN MORIMOTO Date of Exam: 07/19/2021 Medical Rec #:  [id number]        Height:       68.0 in Accession #:    [id number]       Weight:       134.5 lb Date of Birth:  1961/04/24        BSA:          1.727 m Patient Age:    32 years         BP:  127/52 mmHg Patient Gender: M                 HR:           100 bpm. Exam Location:  Inpatient Procedure: Limited Echo, Color Doppler and Cardiac Doppler Indications:    Cardiac Arrest  History:        Patient has prior history of Echocardiogram examinations, most                 recent 07/02/2021. CHF, COPD; Risk Factors:Hypertension and                 Dyslipidemia.  Sonographer:    Raquel Sarna Senior RDCS Referring Phys: [id number] Candee Furbish  Sonographer Comments: Defibrillator pad over parasternal window. IMPRESSIONS  1. Near cavity obliteration. There is an intracavitary gradient. Peak velocity 3.58 m/s. Peak gradient 51.3 mmHg. Left ventricular ejection fraction, by estimation, is >75%. The left ventricle has hyperdynamic function. The left ventricle has no regional wall motion abnormalities.  2. Right ventricle is heavily trabeculated. Right ventricular systolic function is severely reduced. There is severely elevated pulmonary artery systolic pressure.  3. Agitated saline contrast bubble study was negative, with no evidence of any interatrial shunt.  4. A small pericardial effusion is present.  5. Moderate to severe mitral annular calcification.  6. Thickening of the mitral valve leaflets. Tricuspid valve regurgitation is moderate to severe.  7. The inferior vena cava is dilated in size with <50% respiratory variability, suggesting right atrial pressure of 15 mmHg. FINDINGS  Left Ventricle: Near cavity obliteration. There is an intracavitary gradient. Peak velocity 3.58 m/s. Peak gradient 51.3 mmHg. Left ventricular ejection fraction, by estimation, is >75%. The left ventricle has hyperdynamic function. The left ventricle has no regional wall motion abnormalities. Right Ventricle: Right ventricle is heavily trabeculated. Right ventricular systolic function is severely reduced. There is severely elevated pulmonary artery systolic pressure. The tricuspid regurgitant velocity is 4.67 m/s, and with an assumed right atrial pressure of 15 mmHg, the  estimated right ventricular systolic pressure is 546.2 mmHg. Pericardium: A small pericardial effusion is present. Mitral Valve: Moderate to severe mitral annular calcification. Tricuspid Valve: Thickening of the mitral valve leaflets. Tricuspid valve regurgitation is moderate to severe. Venous: The inferior vena cava is dilated in size with less than 50% respiratory variability, suggesting right atrial pressure of 15 mmHg. IAS/Shunts: Agitated saline contrast was given intravenously to evaluate for intracardiac shunting. Agitated saline contrast bubble study was negative, with no evidence of any interatrial shunt. There is no evidence of a patent foramen ovale. There is no  evidence of an atrial septal defect. Additional Comments: Moderate ascites is present. TRICUSPID VALVE TR Peak grad:   87.2 mmHg TR Vmax:        467.00 cm/s Skeet Latch MD Electronically signed by Skeet Latch MD Signature Date/Time: 07/19/2021/5:35:15 PM    Final       Today   Subjective    Thomas Robinson today has no headache,no chest abdominal pain,no new weakness tingling or numbness, feels much better wants to go home today.    Objective   Blood pressure 140/62, pulse 95, temperature 97.9 F (36.6 C), temperature source Oral, resp. rate (!) 26, height 5' 8"  (1.727 m), weight 58.1 kg, SpO2 96 %.   Intake/Output Summary (Last 24 hours) at 08/13/2021 0951 Last data filed at 08/13/2021 0443 Gross per 24 hour  Intake 120 ml  Output 2700 ml  Net -2580 ml    Exam  Awake Alert, No new F.N deficits, Normal affect [place].AT,PERRAL Supple Neck,No JVD, No cervical lymphadenopathy appriciated.  Symmetrical Chest wall movement, Good air movement bilaterally, CTAB RRR,No Gallops,Rubs or new Murmurs, No Parasternal Heave +ve B.Sounds, Abd Soft, Non tender, No organomegaly appriciated, No rebound -guarding or rigidity. No Cyanosis, Clubbing or edema, No new Rash or bruise   Data Review   CBC w Diff:  Lab Results   Component Value Date   WBC 4.7 08/12/2021   HGB 8.8 (L) 08/12/2021   HCT 28.5 (L) 08/12/2021   HCT 27.3 (L) 05/17/2019   PLT 219 08/12/2021   LYMPHOPCT 23 08/12/2021   BANDSPCT 0 07/07/2021   MONOPCT 7 08/12/2021   EOSPCT 5 08/12/2021   BASOPCT 2 08/12/2021    CMP:  Lab Results  Component Value Date   NA 141 08/12/2021   NA 141 03/21/2020   K 4.1 08/12/2021   CL 98 08/12/2021   CO2 28 08/12/2021   BUN 35 (H) 08/12/2021   BUN 53 (H) 03/21/2020   CREATININE 5.70 (H) 08/12/2021   PROT 6.3 (L) 08/12/2021   ALBUMIN 2.7 (L) 08/12/2021   BILITOT 1.1 08/12/2021   ALKPHOS 124 08/12/2021   AST 24 08/12/2021   ALT 12 08/12/2021  .   Total Time in preparing paper work, data evaluation and todays exam - 63 minutes  Lala Lund M.D on 08/13/2021 at 9:51 AM  [facility]

## 2021-08-14 LAB — HEPATITIS B SURFACE ANTIBODY, QUANTITATIVE: Hep B S AB Quant (Post): <3.1 m[IU]/mL — ABNORMAL LOW (ref >9.9)

## 2021-08-15 ENCOUNTER — Telehealth: Payer: Self-pay | Admitting: Nephrology

## 2021-08-15 NOTE — Telephone Encounter (Signed)
Transition of care contact from inpatient facility  Date of Discharge: 08/13/21 Date of Contact: 08/15/21 Method of contact: Phone -attempt   Attempted to contact patient to discuss transition of care from inpatient admission. Patient did not answer the phone. Message was left on the patient's voicemail with call back number [phone].

## 2021-08-25 ENCOUNTER — Other Ambulatory Visit: Payer: Self-pay

## 2021-08-25 ENCOUNTER — Inpatient Hospital Stay (HOSPITAL_COMMUNITY)
Admission: EM | Admit: 2021-08-25 | Discharge: 2021-08-29 | DRG: 291 | Disposition: A | Discharge status: Home / Self Care | Payer: Medicare Other | Attending: Internal Medicine | Admitting: Internal Medicine

## 2021-08-25 ENCOUNTER — Emergency Department (HOSPITAL_COMMUNITY): Payer: Medicare Other

## 2021-08-25 ENCOUNTER — Encounter (HOSPITAL_COMMUNITY): Payer: Self-pay

## 2021-08-25 DIAGNOSIS — E8770 Fluid overload, unspecified: Secondary | ICD-10-CM | POA: Diagnosis not present

## 2021-08-25 DIAGNOSIS — Z9115 Patient's noncompliance with renal dialysis: Secondary | ICD-10-CM

## 2021-08-25 DIAGNOSIS — I132 Hypertensive heart and chronic kidney disease with heart failure and with stage 5 chronic kidney disease, or end stage renal disease: Principal | ICD-10-CM | POA: Diagnosis present

## 2021-08-25 DIAGNOSIS — Z7901 Long term (current) use of anticoagulants: Secondary | ICD-10-CM

## 2021-08-25 DIAGNOSIS — I4891 Unspecified atrial fibrillation: Secondary | ICD-10-CM | POA: Diagnosis present

## 2021-08-25 DIAGNOSIS — D631 Anemia in chronic kidney disease: Secondary | ICD-10-CM | POA: Diagnosis present

## 2021-08-25 DIAGNOSIS — N186 End stage renal disease: Secondary | ICD-10-CM | POA: Diagnosis present

## 2021-08-25 DIAGNOSIS — I509 Heart failure, unspecified: Secondary | ICD-10-CM

## 2021-08-25 DIAGNOSIS — E43 Unspecified severe protein-calorie malnutrition: Secondary | ICD-10-CM | POA: Diagnosis present

## 2021-08-25 DIAGNOSIS — Z8674 Personal history of sudden cardiac arrest: Secondary | ICD-10-CM

## 2021-08-25 DIAGNOSIS — Z8673 Personal history of transient ischemic attack (TIA), and cerebral infarction without residual deficits: Secondary | ICD-10-CM

## 2021-08-25 DIAGNOSIS — Z91199 Patient's noncompliance with other medical treatment and regimen due to unspecified reason: Secondary | ICD-10-CM

## 2021-08-25 DIAGNOSIS — I953 Hypotension of hemodialysis: Secondary | ICD-10-CM | POA: Diagnosis present

## 2021-08-25 DIAGNOSIS — R0600 Dyspnea, unspecified: Secondary | ICD-10-CM | POA: Diagnosis not present

## 2021-08-25 DIAGNOSIS — Z09 Encounter for follow-up examination after completed treatment for conditions other than malignant neoplasm: Secondary | ICD-10-CM

## 2021-08-25 DIAGNOSIS — Z881 Allergy status to other antibiotic agents status: Secondary | ICD-10-CM

## 2021-08-25 DIAGNOSIS — J9621 Acute and chronic respiratory failure with hypoxia: Secondary | ICD-10-CM | POA: Diagnosis present

## 2021-08-25 DIAGNOSIS — E876 Hypokalemia: Secondary | ICD-10-CM | POA: Diagnosis present

## 2021-08-25 DIAGNOSIS — I4819 Other persistent atrial fibrillation: Secondary | ICD-10-CM | POA: Diagnosis present

## 2021-08-25 DIAGNOSIS — R627 Adult failure to thrive: Secondary | ICD-10-CM | POA: Diagnosis present

## 2021-08-25 DIAGNOSIS — J449 Chronic obstructive pulmonary disease, unspecified: Secondary | ICD-10-CM | POA: Diagnosis present

## 2021-08-25 DIAGNOSIS — Z9114 Patient's other noncompliance with medication regimen: Secondary | ICD-10-CM

## 2021-08-25 DIAGNOSIS — I5033 Acute on chronic diastolic (congestive) heart failure: Secondary | ICD-10-CM

## 2021-08-25 DIAGNOSIS — L89152 Pressure ulcer of sacral region, stage 2: Secondary | ICD-10-CM | POA: Diagnosis present

## 2021-08-25 DIAGNOSIS — I7121 Aneurysm of the ascending aorta, without rupture: Secondary | ICD-10-CM | POA: Diagnosis present

## 2021-08-25 DIAGNOSIS — N2581 Secondary hyperparathyroidism of renal origin: Secondary | ICD-10-CM | POA: Diagnosis present

## 2021-08-25 DIAGNOSIS — Z86711 Personal history of pulmonary embolism: Secondary | ICD-10-CM

## 2021-08-25 DIAGNOSIS — I272 Pulmonary hypertension, unspecified: Secondary | ICD-10-CM | POA: Diagnosis present

## 2021-08-25 DIAGNOSIS — Z992 Dependence on renal dialysis: Secondary | ICD-10-CM

## 2021-08-25 DIAGNOSIS — Z981 Arthrodesis status: Secondary | ICD-10-CM

## 2021-08-25 DIAGNOSIS — I4892 Unspecified atrial flutter: Secondary | ICD-10-CM | POA: Diagnosis present

## 2021-08-25 DIAGNOSIS — Z87892 Personal history of anaphylaxis: Secondary | ICD-10-CM

## 2021-08-25 DIAGNOSIS — Z20822 Contact with and (suspected) exposure to covid-19: Secondary | ICD-10-CM | POA: Diagnosis present

## 2021-08-25 DIAGNOSIS — Z681 Body mass index (BMI) 19 or less, adult: Secondary | ICD-10-CM

## 2021-08-25 DIAGNOSIS — E8809 Other disorders of plasma-protein metabolism, not elsewhere classified: Secondary | ICD-10-CM | POA: Diagnosis present

## 2021-08-25 HISTORY — DX: Hypotension, unspecified: I95.9

## 2021-08-25 HISTORY — DX: Rheumatic tricuspid insufficiency: I07.1

## 2021-08-25 HISTORY — DX: Cardiac arrest, cause unspecified: I46.9

## 2021-08-25 HISTORY — DX: Nonrheumatic mitral (valve) insufficiency: I34.0

## 2021-08-25 HISTORY — DX: Nonrheumatic aortic (valve) insufficiency: I35.1

## 2021-08-25 HISTORY — DX: Unspecified atrial flutter: I48.92

## 2021-08-25 LAB — CBC WITH DIFFERENTIAL/PLATELET
Abs Immature Granulocytes: 0.01 10*3/uL (ref 0.00–0.07)
Basophils Absolute: 0.1 10*3/uL (ref 0.0–0.1)
Basophils Relative: 1 %
Eosinophils Absolute: 0.3 10*3/uL (ref 0.0–0.5)
Eosinophils Relative: 7 %
HCT: 28.2 % — ABNORMAL LOW (ref 39.0–52.0)
Hemoglobin: 8.9 g/dL — ABNORMAL LOW (ref 13.0–17.0)
Immature Granulocytes: 0 %
Lymphocytes Relative: 16 %
Lymphs Abs: 0.8 10*3/uL (ref 0.7–4.0)
MCH: 33.2 pg (ref 26.0–34.0)
MCHC: 31.6 g/dL (ref 30.0–36.0)
MCV: 105.2 fL — ABNORMAL HIGH (ref 80.0–100.0)
Monocytes Absolute: 0.3 10*3/uL (ref 0.1–1.0)
Monocytes Relative: 6 %
Neutro Abs: 3.6 10*3/uL (ref 1.7–7.7)
Neutrophils Relative %: 70 %
Platelets: 194 10*3/uL (ref 150–400)
RBC: 2.68 MIL/uL — ABNORMAL LOW (ref 4.22–5.81)
RDW: 16.4 % — ABNORMAL HIGH (ref 11.5–15.5)
WBC: 5.1 10*3/uL (ref 4.0–10.5)
nRBC: 0 % (ref 0.0–0.2)

## 2021-08-25 LAB — CREATININE, SERUM
Creatinine, Ser: 6.48 mg/dL — ABNORMAL HIGH (ref 0.61–1.24)
GFR, Estimated: 9 mL/min — ABNORMAL LOW (ref >60)

## 2021-08-25 LAB — COMPREHENSIVE METABOLIC PANEL
ALT: 8 U/L (ref 0–44)
AST: 20 U/L (ref 15–41)
Albumin: 2.6 g/dL — ABNORMAL LOW (ref 3.5–5.0)
Alkaline Phosphatase: 101 U/L (ref 38–126)
Anion gap: 15 (ref 5–15)
BUN: 42 mg/dL — ABNORMAL HIGH (ref 8–23)
CO2: 26 mmol/L (ref 22–32)
Calcium: 10.1 mg/dL (ref 8.9–10.3)
Chloride: 98 mmol/L (ref 98–111)
Creatinine, Ser: 6.37 mg/dL — ABNORMAL HIGH (ref 0.61–1.24)
GFR, Estimated: 9 mL/min — ABNORMAL LOW (ref >60)
Glucose, Bld: 112 mg/dL — ABNORMAL HIGH (ref 70–99)
Potassium: 3.8 mmol/L (ref 3.5–5.1)
Sodium: 139 mmol/L (ref 135–145)
Total Bilirubin: 1.1 mg/dL (ref 0.3–1.2)
Total Protein: 6.4 g/dL — ABNORMAL LOW (ref 6.5–8.1)

## 2021-08-25 LAB — TROPONIN I (HIGH SENSITIVITY)
Troponin I (High Sensitivity): 147 ng/L (ref <18)
Troponin I (High Sensitivity): 152 ng/L (ref <18)

## 2021-08-25 LAB — BRAIN NATRIURETIC PEPTIDE: B Natriuretic Peptide: >4500 pg/mL — ABNORMAL HIGH (ref 0.0–100.0)

## 2021-08-25 MED ORDER — SEVELAMER CARBONATE 800 MG PO TABS
1600.0000 mg | ORAL_TABLET | Freq: Three times a day (TID) | ORAL | Status: DC
  Administered 2021-08-26 – 2021-08-29 (×7): 1600 mg via ORAL
  Filled 2021-08-25 (×8): qty 2

## 2021-08-25 MED ORDER — SEVELAMER CARBONATE 800 MG PO TABS: 800.0000 mg | ORAL_TABLET | ORAL | Status: DC | PRN

## 2021-08-25 MED ORDER — RENA-VITE PO TABS
1.0000 | ORAL_TABLET | Freq: Every day | ORAL | Status: DC
  Administered 2021-08-26 – 2021-08-28 (×3): 1 via ORAL
  Filled 2021-08-25 (×4): qty 1

## 2021-08-25 MED ORDER — METOPROLOL TARTRATE 50 MG PO TABS
50.0000 mg | ORAL_TABLET | Freq: Two times a day (BID) | ORAL | Status: DC
  Administered 2021-08-26 – 2021-08-27 (×4): 50 mg via ORAL
  Filled 2021-08-25: qty 2
  Filled 2021-08-25 (×3): qty 1
  Filled 2021-08-25: qty 2

## 2021-08-25 MED ORDER — MORPHINE SULFATE (PF) 4 MG/ML IV SOLN
4.0000 mg | Freq: Once | INTRAVENOUS | Status: AC
  Administered 2021-08-25: 21:00:00 4 mg via INTRAVENOUS
  Filled 2021-08-25: qty 1

## 2021-08-25 MED ORDER — HEPARIN SODIUM (PORCINE) 5000 UNIT/ML IJ SOLN: 5000.0000 [IU] | Freq: Three times a day (TID) | INTRAMUSCULAR | Status: DC

## 2021-08-25 NOTE — H&P (Signed)
History and Physical    Thomas Robinson ZOX:[id number] DOB: 1961-01-19 DOA: 08/25/2021  PCP: [name], PA  Patient coming from: Home.  Chief Complaint: Shortness of breath and chest pain.  HPI: Thomas Robinson is a 60 y.o. male with history of ESRD on hemodialysis on Monday Wednesday Friday, diastolic CHF, atrial fibrillation, thoracic aortic aneurysm, COPD/asthma presents to the ER because of increasing shortness of breath since yesterday morning.  Patient also experiencing some chest pain across the chest since yesterday morning off-and-on.  Patient states his last dialysis was on Friday.  Did not miss his dialysis.  Denies any fever chills productive cough nausea vomiting or diarrhea.  ED Course: In the ER patient is found to be in A. fib with RVR chest x-ray shows pleural effusion and fluid overload.  Patient is on 4 L oxygen.  Labs show high sensitive troponin of 147 and 152 hemoglobin 8.9 which is at baseline.  Creatinine of 6.37 potassium 3.8 COVID test is negative.  Patient was given his metoprolol 50 mg p.o. for his heart rate control and admitted for further management of acute respiratory failure with hypoxia secondary to fluid overload and also patient with A. fib with RVR.  At the time of my exam patient chest pain is resolved.  Review of Systems: As per HPI, rest all negative.   Past Medical History:  Diagnosis Date   Anaphylactic shock, unspecified, sequela 06/10/2019   Aortic stenosis    mild-moderate AS 12/2020 echo   Arthritis    Asthma, chronic, unspecified asthma severity, with acute exacerbation 10/23/2017   CAP (community acquired pneumonia) 10/23/2017   Carpal tunnel syndrome of right wrist 10/05/2018   Cataract    right - removed by surgery   Cerebral infarction due to thrombosis of cerebral artery (HCC)    Cervical disc herniation 01/04/2020   Chronic diastolic heart failure ([place]) 04/13/2018   Chronic low back pain 11/24/2019   Constipation    COPD  (chronic obstructive pulmonary disease) (HCC)    Cough    chronic cough   Dyspnea    Encounter for immunization 07/08/2017   ESRD on hemodialysis ([place]) 02/16/2018   Fall 06/04/2019   GERD (gastroesophageal reflux disease) 06/04/2019   GI bleeding 05/24/2017   Gram-negative sepsis, unspecified ([place]) 09/05/2017   History of fusion of cervical spine 03/26/2020   Hyperlipidemia    Hypertension    Hypokalemia 06/04/2017   Iron deficiency anemia, unspecified 09/09/2017   Left shoulder pain 11/11/2019   Lumbar radiculopathy 11/24/2019   Lung contusion 06/04/2019   LVH (left ventricular hypertrophy) due to hypertensive disease, with heart failure ([place]) 06/18/2020   Macrocytic anemia 05/24/2017   Moderate protein-calorie malnutrition ([place]) 06/06/2017   Myofascial pain syndrome 06/12/2020   Neuritis of right ulnar nerve 09/13/2018   Non-compliance with renal dialysis ([place]) 02/08/2020   Oxygen deficiency 12/30/2019   O2 sats on RA 87% at PAT appt    Pain in joint of right elbow 09/13/2018   Pulmonary hypertension ([place])    Renovascular hypertension 06/22/2020   Restless leg syndrome    Rib fractures 06/2019   Right   S/P cardiac cath 08/27/2020   normal coronary arteries.   Secondary hyperparathyroidism of renal origin ([place]) 06/04/2017   Thoracic ascending aortic aneurysm    4.5 cm 06/04/19 CT   Ulnar neuropathy 10/05/2018   Volume overload 10/23/2017    Past Surgical History:  Procedure Laterality Date   A/V FISTULAGRAM N/A 01/01/2021   Procedure: A/V  FISTULAGRAM;  Surgeon: Serafina Mitchell, MD;  Location: [place] [facility];  Service: Cardiovascular;  Laterality: N/A;   A/V FISTULAGRAM N/A 01/15/2021   Procedure: A/V [id number];  Surgeon: Serafina Mitchell, MD;  Location: [place] [facility];  Service: Cardiovascular;  Laterality: N/A;   ANTERIOR CERVICAL DECOMP/DISCECTOMY FUSION N/A 01/04/2020   Procedure: ANTERIOR CERVICAL DECOMPRESSION/DISCECTOMY FUSION CERVICAL FIVE THROUGH SEVEN;   Surgeon: Melina Schools, MD;  Location: [facility];  Service: Orthopedics;  Laterality: N/A;  3 hrs   AV FISTULA PLACEMENT Left 05/28/2017   Procedure: LEFT ARM ARTERIOVENOUS (AV) FISTULA CREATION;  Surgeon: Conrad Dagsboro, MD;  Location: [facility];  Service: Vascular;  Laterality: Left;   AV FISTULA PLACEMENT Left 02/02/2021   Procedure: Debride left forearm, ligation of left upper arm Aretiovenous fistula;  Surgeon: Elam Dutch, MD;  Location: [facility];  Service: Vascular;  Laterality: Left;   AV FISTULA PLACEMENT Right 04/04/2021   Procedure: RIGHT ARTERIOVENOUS (AV) FISTULA CREATION;  Surgeon: Serafina Mitchell, MD;  Location: [facility];  Service: Vascular;  Laterality: Right;   BIOPSY  07/10/2021   Procedure: BIOPSY;  Surgeon: Lavena Bullion, DO;  Location: [facility];  Service: Gastroenterology;;   COLONOSCOPY WITH PROPOFOL N/A 07/10/2021   Procedure: COLONOSCOPY WITH PROPOFOL;  Surgeon: Lavena Bullion, DO;  Location: [facility];  Service: Gastroenterology;  Laterality: N/A;   ESOPHAGOGASTRODUODENOSCOPY (EGD) WITH PROPOFOL N/A 07/10/2021   Procedure: ESOPHAGOGASTRODUODENOSCOPY (EGD) WITH PROPOFOL;  Surgeon: Lavena Bullion, DO;  Location: [facility];  Service: Gastroenterology;  Laterality: N/A;   EYE SURGERY Right 06/02/2019   Cataract removed   FRACTURE SURGERY     INSERTION OF DIALYSIS CATHETER Right 02/02/2021   Procedure: INSERTION OF Right Internal jugular TUNNELED  DIALYSIS CATHETER;  Surgeon: Elam Dutch, MD;  Location: [facility];  Service: Vascular;  Laterality: Right;   IR DIALY SHUNT INTRO NEEDLE/INTRACATH INITIAL W/IMG LEFT Left 09/12/2020   IR FLUORO GUIDE CV LINE RIGHT  05/25/2017   IR FLUORO GUIDE CV LINE RIGHT  07/17/2021   IR US GUIDE VASC ACCESS LEFT  09/12/2020   IR US GUIDE VASC ACCESS RIGHT  05/25/2017   IR US GUIDE VASC ACCESS RIGHT  07/17/2021   LIGATION OF ARTERIOVENOUS  FISTULA Left 01/10/2021   Procedure: LIGATION OF LEFT ARM RADIOCEPHALIC FISTULA;  Surgeon: Serafina Mitchell, MD;  Location: [facility];  Service: Vascular;  Laterality: Left;   PERIPHERAL VASCULAR BALLOON ANGIOPLASTY Left 01/15/2021   Procedure: PERIPHERAL VASCULAR BALLOON ANGIOPLASTY;  Surgeon: Serafina Mitchell, MD;  Location: [facility];  Service: Cardiovascular;  Laterality: Left;  AVF   REVISON OF ARTERIOVENOUS FISTULA Left 07/18/2019   Procedure: REVISION PLICATION OF RADIOCEPHALIC ARTERIOVENOUS FISTULA LEFT ARM;  Surgeon: Angelia Mould, MD;  Location: [facility];  Service: Vascular;  Laterality: Left;   REVISON OF ARTERIOVENOUS FISTULA Left 01/10/2021   Procedure: CONVERSION TO BRACHIOCEPHALIC ARTERIOVENOUS FISTULA;  Surgeon: Serafina Mitchell, MD;  Location: [place] [facility];  Service: Vascular;  Laterality: Left;   RINOPLASTY       reports that he has never smoked. He has never used smokeless tobacco. He reports that he does not currently use alcohol. He reports that he does not use drugs.  Allergies  Allergen Reactions   Vancomycin Shortness Of Breath and Itching    Family History  Problem Relation Age of Onset   Cancer Mother     Prior to Admission medications   Medication Sig Start Date End Date  Taking? Authorizing Provider  albuterol (VENTOLIN HFA) 108 (90 Base) MCG/ACT inhaler Inhale 2 puffs into the lungs 2 (two) times daily as needed for shortness of breath or wheezing. Patient not taking: Reported on 08/11/2021 06/01/21   Oswald Hillock, MD  apixaban (ELIQUIS) 2.5 MG TABS tablet Take 1 tablet (2.5 mg total) by mouth 2 (two) times daily. Patient not taking: Reported on 07/16/2021 07/13/21 08/12/21  Darliss Cheney, MD  cinacalcet (SENSIPAR) 60 MG tablet Take 1 tablet (60 mg total) by mouth every evening. Patient not taking: Reported on 07/16/2021 06/01/21   Oswald Hillock, MD  furosemide (LASIX) 80 MG tablet Take 1 tablet (80 mg total) by mouth daily as needed for fluid (Use only on non-HD days (Tues/Thur/Sat/Sun)). Patient not taking: Reported on 08/11/2021 06/01/21   Oswald Hillock, MD  metoprolol tartrate (LOPRESSOR) 50 MG tablet Take 1 tablet (50 mg total) by mouth 2 (two) times daily. 06/01/21   Oswald Hillock, MD  multivitamin (RENA-VIT) TABS tablet Take 1 tablet by mouth at bedtime. 01/12/18   [provider]  sevelamer carbonate (RENVELA) 800 MG tablet Take 800-1,600 mg by mouth See admin instructions. Take 1-2 tablets ([phone] mg) by mouth three times daily with meals, take 1 tablet (800 mg) with snacks 05/31/21   [provider]    Physical Exam: Constitutional: Moderately built and nourished. Vitals:   08/25/21 1826 08/25/21 1829 08/25/21 2039 08/25/21 2100  BP:  103/66 (!) 159/79 (!) 153/79  Pulse:  (!) 110 (!) 47 (!) 126  Resp:  (!) 24 (!) 28 (!) 26  Temp:  98.7 F (37.1 C)    SpO2: 96% 96%  96%   Eyes: Anicteric no pallor. ENMT: No discharge from the ears eyes nose and mouth. Neck: No mass felt.  No neck rigidity. Respiratory: No rhonchi or crepitations. Cardiovascular: S1-S2 heard. Abdomen: Soft nontender bowel sounds present. Musculoskeletal: No edema. Skin: No rash. Neurologic: Alert awake oriented time place and person.  Moves all extremities. Psychiatric: Appears normal.  Affect.   Labs on Admission: I have personally reviewed following labs and imaging studies  CBC: Recent Labs  Lab 08/25/21 1930  WBC 5.1  NEUTROABS 3.6  HGB 8.9*  HCT 28.2*  MCV 105.2*  PLT 672   Basic Metabolic Panel: Recent Labs  Lab 08/25/21 1930  NA 139  K 3.8  CL 98  CO2 26  GLUCOSE 112*  BUN 42*  CREATININE 6.37*  CALCIUM 10.1   GFR: Estimated Creatinine Clearance: 9.9 mL/min (A) (by C-G formula based on SCr of 6.37 mg/dL (H)). Liver Function Tests: Recent Labs  Lab 08/25/21 1930  AST 20  ALT 8  ALKPHOS 101  BILITOT 1.1  PROT 6.4*  ALBUMIN 2.6*   No results for input(s): LIPASE, AMYLASE in the last 168 hours. No results for input(s): AMMONIA in the last 168 hours. Coagulation Profile: No results for input(s): INR,  PROTIME in the last 168 hours. Cardiac Enzymes: No results for input(s): CKTOTAL, CKMB, CKMBINDEX, TROPONINI in the last 168 hours. BNP (last 3 results) No results for input(s): PROBNP in the last 8760 hours. HbA1C: No results for input(s): HGBA1C in the last 72 hours. CBG: No results for input(s): GLUCAP in the last 168 hours. Lipid Profile: No results for input(s): CHOL, HDL, LDLCALC, TRIG, CHOLHDL, LDLDIRECT in the last 72 hours. Thyroid Function Tests: No results for input(s): TSH, T4TOTAL, FREET4, T3FREE, THYROIDAB in the last 72 hours. Anemia Panel: No results for input(s): VITAMINB12, FOLATE,  FERRITIN, TIBC, IRON, RETICCTPCT in the last 72 hours. Urine analysis:    Component Value Date/Time   COLORURINE YELLOW 05/24/2017 1729   APPEARANCEUR HAZY (A) 05/24/2017 1729   LABSPEC 1.011 05/24/2017 1729   PHURINE 5.0 05/24/2017 1729   GLUCOSEU 50 (A) 05/24/2017 1729   HGBUR SMALL (A) 05/24/2017 1729   BILIRUBINUR NEGATIVE 05/24/2017 1729   KETONESUR NEGATIVE 05/24/2017 1729   PROTEINUR 100 (A) 05/24/2017 1729   UROBILINOGEN 0.2 04/09/2015 1527   NITRITE NEGATIVE 05/24/2017 1729   LEUKOCYTESUR TRACE (A) 05/24/2017 1729   Sepsis Labs: @LABRCNTIP (procalcitonin:4,lacticidven:4) )No results found for this or any previous visit (from the past 240 hour(s)).   Radiological Exams on Admission: DG Chest 1 View  Result Date: 08/25/2021 CLINICAL DATA:  Chest pain since last night, end-stage renal disease on hemodialysis, nonproductive cough, CHF, COPD, hypertension, asthma EXAM: CHEST  1 VIEW COMPARISON:  Portable exam 1842 hours compared to 08/11/2021 FINDINGS: RIGHT jugular dual lumen central venous catheter with tip projecting over SVC. Enlargement of cardiac silhouette with pulmonary vascular congestion. Diffuse pulmonary infiltrates increased from previous exam consistent with pulmonary edema. Persistent RIGHT pleural effusion and basilar atelectasis. No pneumothorax. Atherosclerotic  calcification aortic arch. Prior cervical spine fusion. IMPRESSION: Pulmonary edema consistent with CHF versus fluid overload, increased from previous exam. Persistent RIGHT pleural effusion and basilar atelectasis. Aortic Atherosclerosis (ICD10-I70.0). Electronically Signed   By: Lavonia Dana M.D.   On: 08/25/2021 18:49    EKG: Independently reviewed.  A. fib with RVR.  Assessment/Plan Principal Problem:   Fluid overload Active Problems:   ESRD on dialysis ([place])   Acute exacerbation of congestive heart failure (HCC)   Acute on chronic diastolic CHF (congestive heart failure) (HCC)   Decubitus ulcer of coccyx, stage 2 (HCC)   Atrial fibrillation with RVR (HCC)    Acute respiratory failure with hypoxia secondary to acute pulmonary edema presently on 4 L oxygen we will consult nephrology for dialysis. A. fib with RVR not sure if patient has been compliant with his metoprolol.  At this time I did order metoprolol his home dose which at first patient refused to take but I had talked to the patient again and he is agreeing to take it.  We will closely monitor heart rate.  Patient will be placed on Eliquis.  Not sure if he was taking at home. Chest pain with history of thoracic aortic aneurysm 4.2 cm when measured in October 2022.  Presently chest pain-free.  Will order CT angiogram of the chest if patient agrees.  Cardiac markers are flat at this time. Chronic diastolic CHF presently fluid managed per nephrology from dialysis. COPD not actively wheezing. History of stage II sacral decubitus ulcer. Chronic anemia follow CBC.  Likely from renal disease. Prior history of PEA arrest.   DVT prophylaxis: Apixaban. Code Status: Full code. Family Communication: Discussed with patient. Disposition Plan: Home. Consults called: We will consult nephrology. Admission status: Observation.   Rise Patience MD [facility] Pager [phone].  If 7PM-7AM, please contact  night-coverage [email or web address] Password [facility]  08/25/2021, 10:21 PM

## 2021-08-25 NOTE — ED Provider Notes (Signed)
[facility] EMERGENCY DEPARTMENT Provider Note   CSN: [id number] Arrival date & time: 08/25/21  1824     History Chief Complaint  Patient presents with   Shortness of Breath    Chest pain    Thomas Robinson is a 60 y.o. male with PMH ESRD hemodialysis Monday Wednesday Friday, chronic hypoxia on 3 L, asthma, CVA, CHF, COPD, HLD, HTN, known thoracic ascending aortic aneurysm 4.5 cm who presents the emergency department for evaluation of chest pain and shortness of breath.  Patient states that his chest pain began last night and he has been feeling more progressively short of breath.  Patient states that he has been compliant with his dialysis.  Denies nausea, vomiting, diarrhea, headache or other systemic symptoms.   Shortness of Breath Associated symptoms: chest pain   Associated symptoms: no abdominal pain, no cough, no ear pain, no fever, no rash, no sore throat and no vomiting       Past Medical History:  Diagnosis Date   Anaphylactic shock, unspecified, sequela 06/10/2019   Aortic stenosis    mild-moderate AS 12/2020 echo   Arthritis    Asthma, chronic, unspecified asthma severity, with acute exacerbation 10/23/2017   CAP (community acquired pneumonia) 10/23/2017   Carpal tunnel syndrome of right wrist 10/05/2018   Cataract    right - removed by surgery   Cerebral infarction due to thrombosis of cerebral artery (HCC)    Cervical disc herniation 01/04/2020   Chronic diastolic heart failure ([place]) 04/13/2018   Chronic low back pain 11/24/2019   Constipation    COPD (chronic obstructive pulmonary disease) (HCC)    Cough    chronic cough   Dyspnea    Encounter for immunization 07/08/2017   ESRD on hemodialysis ([place]) 02/16/2018   Fall 06/04/2019   GERD (gastroesophageal reflux disease) 06/04/2019   GI bleeding 05/24/2017   Gram-negative sepsis, unspecified ([place]) 09/05/2017   History of fusion of cervical spine 03/26/2020   Hyperlipidemia     Hypertension    Hypokalemia 06/04/2017   Iron deficiency anemia, unspecified 09/09/2017   Left shoulder pain 11/11/2019   Lumbar radiculopathy 11/24/2019   Lung contusion 06/04/2019   LVH (left ventricular hypertrophy) due to hypertensive disease, with heart failure ([place]) 06/18/2020   Macrocytic anemia 05/24/2017   Moderate protein-calorie malnutrition ([place]) 06/06/2017   Myofascial pain syndrome 06/12/2020   Neuritis of right ulnar nerve 09/13/2018   Non-compliance with renal dialysis ([place]) 02/08/2020   Oxygen deficiency 12/30/2019   O2 sats on RA 87% at PAT appt    Pain in joint of right elbow 09/13/2018   Pulmonary hypertension ([place])    Renovascular hypertension 06/22/2020   Restless leg syndrome    Rib fractures 06/2019   Right   S/P cardiac cath 08/27/2020   normal coronary arteries.   Secondary hyperparathyroidism of renal origin ([place]) 06/04/2017   Thoracic ascending aortic aneurysm    4.5 cm 06/04/19 CT   Ulnar neuropathy 10/05/2018   Volume overload 10/23/2017    Patient Active Problem List   Diagnosis Date Noted   Sacral pressure sore 08/12/2021   Shortness of breath 07/16/2021   Balanitis 07/16/2021   Vomiting    Gastritis and gastroduodenitis    Grade III hemorrhoids    Decubitus ulcer of coccyx, stage 2 ([place]) 07/08/2021   Prolonged QT interval 07/08/2021   Hematochezia    Atrial flutter ([place])    Pressure injury of skin 07/02/2021   Abnormal chest CT 06/30/2021  Pleural effusion, right 06/30/2021   SIRS (systemic inflammatory response syndrome) ([place]) 06/30/2021   Acute hypoxemic respiratory failure ([place]) 05/18/2021   Bronchitis 05/18/2021   Preoperative cardiovascular examination 03/06/2021   CHF (congestive heart failure) ([place]) 02/17/2021   Cellulitis of arm, left 02/01/2021   Cellulitis of left lower limb 02/01/2021   Cellulitis, unspecified 02/01/2021   Left arm swelling 01/25/2021   Acute and chronic respiratory failure with hypoxia ([place]) 01/13/2021    Acute on chronic respiratory failure with hypoxia ([place]) 01/12/2021   Concussion with no loss of consciousness 01/12/2021   Laceration without foreign body of left forearm, initial encounter 01/12/2021   Age-related physical debility 09/20/2020   Allergy, unspecified, initial encounter 09/20/2020   Nausea 09/20/2020   Pain, unspecified 09/20/2020   Pruritus, unspecified 09/20/2020   S/P cardiac cath 08/27/2020   Acute encephalopathy 08/26/2020   Peripheral neuropathy 08/26/2020   Elevated troponin 08/25/2020   Hyperkalemia 08/05/2020   Arthritis    Asthma    Cataract    Cerebral infarction due to thrombosis of cerebral artery ([place])    History of CVA (cerebrovascular accident)    Chronic kidney disease    Dyspnea    Hyperlipidemia    Hypertension    Kidney failure    Restless leg syndrome    Degenerative lumbar spinal stenosis 37/90/2409   Diastolic dysfunction 73/53/2992   Renovascular hypertension 06/22/2020   Demand ischemia ([place]) 06/18/2020   LVH (left ventricular hypertrophy) due to hypertensive disease, with heart failure ([place]) 06/18/2020   Myofascial pain syndrome 06/12/2020   Encounter for removal of sutures 03/29/2020   History of fusion of cervical spine 03/26/2020   Non-compliance with renal dialysis ([place]) 02/08/2020   Pulmonary edema 01/23/2020   Cervical disc herniation 01/04/2020   Tachycardia 01/04/2020   SOB (shortness of breath)    Hypoxia 12/30/2019   Chronic low back pain 11/24/2019   Degeneration of lumbar intervertebral disc 11/24/2019   Lumbar radiculopathy 11/24/2019   Left shoulder pain 11/11/2019   Anaphylactic shock, unspecified, sequela 06/10/2019   Rib fractures 06/05/2019   Fall at home, initial encounter 06/04/2019   Right rib fracture 06/04/2019   Lung contusion 06/04/2019   Acute respiratory failure with hypoxia ([place]) 06/04/2019   Anemia in ESRD (end-stage renal disease) ([place]) 06/04/2019   GERD without esophagitis 06/04/2019   Chronic  diastolic (congestive) heart failure ([place]) 06/04/2019   Fall 06/04/2019   Thoracic ascending aortic aneurysm 06/04/2019   Acute exacerbation of congestive heart failure ([place]) 05/16/2019   Carpal tunnel syndrome of right wrist 10/05/2018   Ulnar neuropathy 10/05/2018   Neuritis of right ulnar nerve 09/13/2018   Pain in joint of right elbow 09/13/2018   Chest pain 04/13/2018   Chronic diastolic heart failure ([place]) 04/13/2018   Acute on chronic diastolic CHF (congestive heart failure) ([place]) 04/13/2018   Atypical chest pain 02/16/2018   ESRD on dialysis ([place]) 02/16/2018   End stage renal disease ([place]) 02/16/2018   CAP (community acquired pneumonia) 10/23/2017   Asthma, chronic, unspecified asthma severity, with acute exacerbation 10/23/2017   Respiratory failure, acute ([place]) 10/23/2017   Pulmonary hypertension ([place]) 10/23/2017   Iron deficiency anemia, unspecified 09/09/2017   Gram-negative sepsis, unspecified ([place]) 09/05/2017   Encounter for immunization 07/08/2017   Moderate protein-calorie malnutrition ([place]) 06/06/2017   Coagulation defect, unspecified ([place]) 06/04/2017   Hypokalemia 06/04/2017   Other hyperlipidemia 06/04/2017   Secondary hyperparathyroidism of renal origin ([place]) 06/04/2017   Macrocytic anemia 05/24/2017   Uremia 05/24/2017  Metabolic acidosis 09/03/7251   Hypertensive urgency 05/24/2017   Acute kidney injury superimposed on CKD ([place]) 05/24/2017   CKD (chronic kidney disease), stage V ([place]) 05/24/2017   GI bleeding 05/24/2017   Anemia of chronic disease 05/24/2017   Stroke ([place]) 2018   AKI (acute kidney injury) ([place]) 04/10/2015   History of completed stroke    Essential hypertension 04/09/2015    Past Surgical History:  Procedure Laterality Date   A/V FISTULAGRAM N/A 01/01/2021   Procedure: A/V FISTULAGRAM;  Surgeon: Serafina Mitchell, MD;  Location: [facility];  Service: Cardiovascular;  Laterality: N/A;   A/V FISTULAGRAM N/A 01/15/2021   Procedure:  A/V [id number];  Surgeon: Serafina Mitchell, MD;  Location: [facility];  Service: Cardiovascular;  Laterality: N/A;   ANTERIOR CERVICAL DECOMP/DISCECTOMY FUSION N/A 01/04/2020   Procedure: ANTERIOR CERVICAL DECOMPRESSION/DISCECTOMY FUSION CERVICAL FIVE THROUGH SEVEN;  Surgeon: Melina Schools, MD;  Location: [facility];  Service: Orthopedics;  Laterality: N/A;  3 hrs   AV FISTULA PLACEMENT Left 05/28/2017   Procedure: LEFT ARM ARTERIOVENOUS (AV) FISTULA CREATION;  Surgeon: Conrad Hunker, MD;  Location: [facility];  Service: Vascular;  Laterality: Left;   AV FISTULA PLACEMENT Left 02/02/2021   Procedure: Debride left forearm, ligation of left upper arm Aretiovenous fistula;  Surgeon: Elam Dutch, MD;  Location: [facility];  Service: Vascular;  Laterality: Left;   AV FISTULA PLACEMENT Right 04/04/2021   Procedure: RIGHT ARTERIOVENOUS (AV) FISTULA CREATION;  Surgeon: Serafina Mitchell, MD;  Location: [facility];  Service: Vascular;  Laterality: Right;   BIOPSY  07/10/2021   Procedure: BIOPSY;  Surgeon: Lavena Bullion, DO;  Location: [facility];  Service: Gastroenterology;;   COLONOSCOPY WITH PROPOFOL N/A 07/10/2021   Procedure: COLONOSCOPY WITH PROPOFOL;  Surgeon: Lavena Bullion, DO;  Location: [facility];  Service: Gastroenterology;  Laterality: N/A;   ESOPHAGOGASTRODUODENOSCOPY (EGD) WITH PROPOFOL N/A 07/10/2021   Procedure: ESOPHAGOGASTRODUODENOSCOPY (EGD) WITH PROPOFOL;  Surgeon: Lavena Bullion, DO;  Location: [facility];  Service: Gastroenterology;  Laterality: N/A;   EYE SURGERY Right 06/02/2019   Cataract removed   FRACTURE SURGERY     INSERTION OF DIALYSIS CATHETER Right 02/02/2021   Procedure: INSERTION OF Right Internal jugular TUNNELED  DIALYSIS CATHETER;  Surgeon: Elam Dutch, MD;  Location: [facility];  Service: Vascular;  Laterality: Right;   IR DIALY SHUNT INTRO NEEDLE/INTRACATH INITIAL W/IMG LEFT Left 09/12/2020   IR FLUORO GUIDE CV LINE RIGHT  05/25/2017   IR FLUORO GUIDE CV LINE  RIGHT  07/17/2021   IR US GUIDE VASC ACCESS LEFT  09/12/2020   IR US GUIDE VASC ACCESS RIGHT  05/25/2017   IR US GUIDE VASC ACCESS RIGHT  07/17/2021   LIGATION OF ARTERIOVENOUS  FISTULA Left 01/10/2021   Procedure: LIGATION OF LEFT ARM RADIOCEPHALIC FISTULA;  Surgeon: Serafina Mitchell, MD;  [facility];  Service: Vascular;  Laterality: Left;   PERIPHERAL VASCULAR BALLOON ANGIOPLASTY Left 01/15/2021   Procedure: PERIPHERAL VASCULAR BALLOON ANGIOPLASTY;  Surgeon: Serafina Mitchell, MD;  Location: [facility];  Service: Cardiovascular;  Laterality: Left;  AVF   REVISON OF ARTERIOVENOUS FISTULA Left 07/18/2019   Procedure: REVISION PLICATION OF RADIOCEPHALIC ARTERIOVENOUS FISTULA LEFT ARM;  Surgeon: Angelia Mould, MD;  Location: [facility];  Service: Vascular;  Laterality: Left;   REVISON OF ARTERIOVENOUS FISTULA Left 01/10/2021   Procedure: CONVERSION TO BRACHIOCEPHALIC ARTERIOVENOUS FISTULA;  Surgeon: Serafina Mitchell, MD;  Location: [facility];  Service: Vascular;  Laterality: Left;   RINOPLASTY         Family History  Problem Relation Age of Onset   Cancer Mother     Social History   Tobacco Use   Smoking status: Never   Smokeless tobacco: Never  Vaping Use   Vaping Use: Never used  Substance Use Topics   Alcohol use: Not Currently    Alcohol/week: 0.0 standard drinks    Comment: has a drink once in a while- 12/30/19   Drug use: No    Home Medications Prior to Admission medications   Medication Sig Start Date End Date Taking? Authorizing Provider  albuterol (VENTOLIN HFA) 108 (90 Base) MCG/ACT inhaler Inhale 2 puffs into the lungs 2 (two) times daily as needed for shortness of breath or wheezing. Patient not taking: Reported on 08/11/2021 06/01/21   Oswald Hillock, MD  apixaban (ELIQUIS) 2.5 MG TABS tablet Take 1 tablet (2.5 mg total) by mouth 2 (two) times daily. Patient not taking: Reported on 07/16/2021 07/13/21 08/12/21  Darliss Cheney, MD  cinacalcet (SENSIPAR) 60 MG  tablet Take 1 tablet (60 mg total) by mouth every evening. Patient not taking: Reported on 07/16/2021 06/01/21   Oswald Hillock, MD  furosemide (LASIX) 80 MG tablet Take 1 tablet (80 mg total) by mouth daily as needed for fluid (Use only on non-HD days (Tues/Thur/Sat/Sun)). Patient not taking: Reported on 08/11/2021 06/01/21   Oswald Hillock, MD  metoprolol tartrate (LOPRESSOR) 50 MG tablet Take 1 tablet (50 mg total) by mouth 2 (two) times daily. 06/01/21   Oswald Hillock, MD  multivitamin (RENA-VIT) TABS tablet Take 1 tablet by mouth at bedtime. 01/12/18   [provider]  sevelamer carbonate (RENVELA) 800 MG tablet Take 800-1,600 mg by mouth See admin instructions. Take 1-2 tablets ([phone] mg) by mouth three times daily with meals, take 1 tablet (800 mg) with snacks 05/31/21   [provider]    Allergies    Vancomycin  Review of Systems   Review of Systems  Constitutional:  Negative for chills and fever.  HENT:  Negative for ear pain and sore throat.   Eyes:  Negative for pain and visual disturbance.  Respiratory:  Positive for shortness of breath. Negative for cough.   Cardiovascular:  Positive for chest pain. Negative for palpitations.  Gastrointestinal:  Negative for abdominal pain and vomiting.  Genitourinary:  Negative for dysuria and hematuria.  Musculoskeletal:  Negative for arthralgias and back pain.  Skin:  Negative for color change and rash.  Neurological:  Negative for seizures and syncope.  All other systems reviewed and are negative.  Physical Exam Updated Vital Signs BP (!) 153/79    Pulse (!) 126    Temp 98.7 F (37.1 C)    Resp (!) 26    SpO2 96%   Physical Exam Vitals and nursing note reviewed.  Constitutional:      General: He is not in acute distress.    Appearance: He is well-developed.  HENT:     Head: Normocephalic and atraumatic.  Eyes:     Conjunctiva/sclera: Conjunctivae normal.  Cardiovascular:     Rate and Rhythm: Normal rate and  regular rhythm.     Heart sounds: No murmur heard. Pulmonary:     Effort: Pulmonary effort is normal. No respiratory distress.     Breath sounds: Rales present.  Abdominal:     Palpations: Abdomen is soft.     Tenderness: There is no abdominal tenderness.  Musculoskeletal:  General: No swelling.     Cervical back: Neck supple.     Right lower leg: Edema present.     Left lower leg: Edema present.  Skin:    General: Skin is warm and dry.     Capillary Refill: Capillary refill takes less than 2 seconds.  Neurological:     Mental Status: He is alert.  Psychiatric:        Mood and Affect: Mood normal.    ED Results / Procedures / Treatments   Labs (all labs ordered are listed, but only abnormal results are displayed) Labs Reviewed  COMPREHENSIVE METABOLIC PANEL - Abnormal; Notable for the following components:      Result Value   Glucose, Bld 112 (*)    BUN 42 (*)    Creatinine, Ser 6.37 (*)    Total Protein 6.4 (*)    Albumin 2.6 (*)    GFR, Estimated 9 (*)    All other components within normal limits  CBC WITH DIFFERENTIAL/PLATELET - Abnormal; Notable for the following components:   RBC 2.68 (*)    Hemoglobin 8.9 (*)    HCT 28.2 (*)    MCV 105.2 (*)    RDW 16.4 (*)    All other components within normal limits  BRAIN NATRIURETIC PEPTIDE - Abnormal; Notable for the following components:   B Natriuretic Peptide >4,500.0 (*)    All other components within normal limits  TROPONIN I (HIGH SENSITIVITY) - Abnormal; Notable for the following components:   Troponin I (High Sensitivity) 147 (*)    All other components within normal limits  TROPONIN I (HIGH SENSITIVITY)    EKG None  Radiology DG Chest 1 View  Result Date: 08/25/2021 CLINICAL DATA:  Chest pain since last night, end-stage renal disease on hemodialysis, nonproductive cough, CHF, COPD, hypertension, asthma EXAM: CHEST  1 VIEW COMPARISON:  Portable exam 1842 hours compared to 08/11/2021 FINDINGS: RIGHT  jugular dual lumen central venous catheter with tip projecting over SVC. Enlargement of cardiac silhouette with pulmonary vascular congestion. Diffuse pulmonary infiltrates increased from previous exam consistent with pulmonary edema. Persistent RIGHT pleural effusion and basilar atelectasis. No pneumothorax. Atherosclerotic calcification aortic arch. Prior cervical spine fusion. IMPRESSION: Pulmonary edema consistent with CHF versus fluid overload, increased from previous exam. Persistent RIGHT pleural effusion and basilar atelectasis. Aortic Atherosclerosis (ICD10-I70.0). Electronically Signed   By: Lavonia Dana M.D.   On: 08/25/2021 18:49    Procedures Procedures   Medications Ordered in ED Medications  morphine 4 MG/ML injection 4 mg (4 mg Intravenous Given 08/25/21 2042)    ED Course  I have reviewed the triage vital signs and the nursing notes.  Pertinent labs & imaging results that were available during my care of the patient were reviewed by me and considered in my medical decision making (see chart for details).    MDM Rules/Calculators/A&P                          Patient seen emergency department for evaluation of chest pain shortness of breath.  Physical exam reveals rales in bilateral bases with mild lower extremity edema.  Laboratory evaluation with a hemoglobin of 8.8, BNP greater than 4500, initial troponin elevated to 147, COVID and flu negative.  Chest x-ray with pulmonary edema and right pleural effusion.  Patient will require dialysis tomorrow for fluid overload.  Patient was then admitted to the hospitalist for management of likely type II NSTEMI and fluid overload in  setting of ESRD.   Final Clinical Impression(s) / ED Diagnoses Final diagnoses:  Dyspnea    Rx / DC Orders ED Discharge Orders     None        Tyquisha Sharps, Debe Coder, MD 08/26/21 [phone]

## 2021-08-25 NOTE — ED Notes (Signed)
ED Provider at bedside. 

## 2021-08-25 NOTE — ED Triage Notes (Signed)
Arrived via ems c/o chest pain since last night, pt hemodialysis pt, m-w-f, non productive cough, no fevers.

## 2021-08-26 ENCOUNTER — Observation Stay (HOSPITAL_COMMUNITY): Payer: Medicare Other

## 2021-08-26 DIAGNOSIS — I5033 Acute on chronic diastolic (congestive) heart failure: Secondary | ICD-10-CM | POA: Diagnosis present

## 2021-08-26 DIAGNOSIS — E8809 Other disorders of plasma-protein metabolism, not elsewhere classified: Secondary | ICD-10-CM | POA: Diagnosis present

## 2021-08-26 DIAGNOSIS — I5023 Acute on chronic systolic (congestive) heart failure: Secondary | ICD-10-CM | POA: Diagnosis not present

## 2021-08-26 DIAGNOSIS — R627 Adult failure to thrive: Secondary | ICD-10-CM | POA: Diagnosis present

## 2021-08-26 DIAGNOSIS — I272 Pulmonary hypertension, unspecified: Secondary | ICD-10-CM | POA: Diagnosis present

## 2021-08-26 DIAGNOSIS — J449 Chronic obstructive pulmonary disease, unspecified: Secondary | ICD-10-CM | POA: Diagnosis present

## 2021-08-26 DIAGNOSIS — J9621 Acute and chronic respiratory failure with hypoxia: Secondary | ICD-10-CM | POA: Diagnosis present

## 2021-08-26 DIAGNOSIS — Z992 Dependence on renal dialysis: Secondary | ICD-10-CM

## 2021-08-26 DIAGNOSIS — N186 End stage renal disease: Secondary | ICD-10-CM | POA: Diagnosis present

## 2021-08-26 DIAGNOSIS — E43 Unspecified severe protein-calorie malnutrition: Secondary | ICD-10-CM | POA: Diagnosis present

## 2021-08-26 DIAGNOSIS — I132 Hypertensive heart and chronic kidney disease with heart failure and with stage 5 chronic kidney disease, or end stage renal disease: Secondary | ICD-10-CM | POA: Diagnosis present

## 2021-08-26 DIAGNOSIS — Z8673 Personal history of transient ischemic attack (TIA), and cerebral infarction without residual deficits: Secondary | ICD-10-CM | POA: Diagnosis not present

## 2021-08-26 DIAGNOSIS — R0600 Dyspnea, unspecified: Secondary | ICD-10-CM | POA: Diagnosis present

## 2021-08-26 DIAGNOSIS — I4892 Unspecified atrial flutter: Secondary | ICD-10-CM | POA: Diagnosis present

## 2021-08-26 DIAGNOSIS — Z681 Body mass index (BMI) 19 or less, adult: Secondary | ICD-10-CM | POA: Diagnosis not present

## 2021-08-26 DIAGNOSIS — E876 Hypokalemia: Secondary | ICD-10-CM | POA: Diagnosis present

## 2021-08-26 DIAGNOSIS — I7121 Aneurysm of the ascending aorta, without rupture: Secondary | ICD-10-CM | POA: Diagnosis present

## 2021-08-26 DIAGNOSIS — N2581 Secondary hyperparathyroidism of renal origin: Secondary | ICD-10-CM | POA: Diagnosis present

## 2021-08-26 DIAGNOSIS — L89152 Pressure ulcer of sacral region, stage 2: Secondary | ICD-10-CM | POA: Diagnosis present

## 2021-08-26 DIAGNOSIS — Z20822 Contact with and (suspected) exposure to covid-19: Secondary | ICD-10-CM | POA: Diagnosis present

## 2021-08-26 DIAGNOSIS — Z8674 Personal history of sudden cardiac arrest: Secondary | ICD-10-CM | POA: Diagnosis not present

## 2021-08-26 DIAGNOSIS — I953 Hypotension of hemodialysis: Secondary | ICD-10-CM | POA: Diagnosis present

## 2021-08-26 DIAGNOSIS — I4819 Other persistent atrial fibrillation: Secondary | ICD-10-CM | POA: Diagnosis present

## 2021-08-26 DIAGNOSIS — Z87892 Personal history of anaphylaxis: Secondary | ICD-10-CM | POA: Diagnosis not present

## 2021-08-26 DIAGNOSIS — I480 Paroxysmal atrial fibrillation: Secondary | ICD-10-CM | POA: Diagnosis not present

## 2021-08-26 DIAGNOSIS — E8770 Fluid overload, unspecified: Secondary | ICD-10-CM | POA: Diagnosis not present

## 2021-08-26 DIAGNOSIS — Z7901 Long term (current) use of anticoagulants: Secondary | ICD-10-CM | POA: Diagnosis not present

## 2021-08-26 DIAGNOSIS — I4891 Unspecified atrial fibrillation: Secondary | ICD-10-CM | POA: Diagnosis not present

## 2021-08-26 DIAGNOSIS — D631 Anemia in chronic kidney disease: Secondary | ICD-10-CM | POA: Diagnosis present

## 2021-08-26 LAB — CBC
HCT: 32 % — ABNORMAL LOW (ref 39.0–52.0)
Hemoglobin: 9.7 g/dL — ABNORMAL LOW (ref 13.0–17.0)
MCH: 33 pg (ref 26.0–34.0)
MCHC: 30.3 g/dL (ref 30.0–36.0)
MCV: 108.8 fL — ABNORMAL HIGH (ref 80.0–100.0)
Platelets: 200 10*3/uL (ref 150–400)
RBC: 2.94 MIL/uL — ABNORMAL LOW (ref 4.22–5.81)
RDW: 16.6 % — ABNORMAL HIGH (ref 11.5–15.5)
WBC: 4.8 10*3/uL (ref 4.0–10.5)
nRBC: 0 % (ref 0.0–0.2)

## 2021-08-26 LAB — RESP PANEL BY RT-PCR (FLU A&B, COVID) ARPGX2
Influenza A by PCR: NEGATIVE
Influenza B by PCR: NEGATIVE
SARS Coronavirus 2 by RT PCR: NEGATIVE

## 2021-08-26 LAB — BASIC METABOLIC PANEL
Anion gap: 20 — ABNORMAL HIGH (ref 5–15)
BUN: 48 mg/dL — ABNORMAL HIGH (ref 8–23)
CO2: 20 mmol/L — ABNORMAL LOW (ref 22–32)
Calcium: 9.8 mg/dL (ref 8.9–10.3)
Chloride: 96 mmol/L — ABNORMAL LOW (ref 98–111)
Creatinine, Ser: 6.7 mg/dL — ABNORMAL HIGH (ref 0.61–1.24)
GFR, Estimated: 9 mL/min — ABNORMAL LOW (ref >60)
Glucose, Bld: 62 mg/dL — ABNORMAL LOW (ref 70–99)
Potassium: 4.5 mmol/L (ref 3.5–5.1)
Sodium: 136 mmol/L (ref 135–145)

## 2021-08-26 LAB — TSH: TSH: 5.097 u[IU]/mL — ABNORMAL HIGH (ref 0.350–4.500)

## 2021-08-26 MED ORDER — APIXABAN 2.5 MG PO TABS
2.5000 mg | ORAL_TABLET | Freq: Two times a day (BID) | ORAL | Status: DC
  Administered 2021-08-26 – 2021-08-29 (×8): 2.5 mg via ORAL
  Filled 2021-08-26 (×8): qty 1

## 2021-08-26 MED ORDER — NEPRO/CARBSTEADY PO LIQD
237.0000 mL | Freq: Two times a day (BID) | ORAL | Status: DC
  Administered 2021-08-27 – 2021-08-28 (×3): 237 mL via ORAL
  Filled 2021-08-26: qty 237

## 2021-08-26 MED ORDER — IOHEXOL 350 MG/ML SOLN
90.0000 mL | Freq: Once | INTRAVENOUS | Status: AC | PRN
  Administered 2021-08-26: 07:00:00 90 mL via INTRAVENOUS

## 2021-08-26 MED ORDER — AMIODARONE LOAD VIA INFUSION
150.0000 mg | Freq: Once | INTRAVENOUS | Status: DC
  Filled 2021-08-26: qty 83.34

## 2021-08-26 MED ORDER — OXYCODONE-ACETAMINOPHEN 5-325 MG PO TABS
1.0000 | ORAL_TABLET | ORAL | Status: DC | PRN
  Administered 2021-08-26: 14:00:00 1 via ORAL
  Filled 2021-08-26: qty 1

## 2021-08-26 MED ORDER — CHLORHEXIDINE GLUCONATE CLOTH 2 % EX PADS
6.0000 | MEDICATED_PAD | Freq: Every day | CUTANEOUS | Status: DC
  Administered 2021-08-27 – 2021-08-28 (×2): 6 via TOPICAL

## 2021-08-26 MED ORDER — METOPROLOL TARTRATE 5 MG/5ML IV SOLN
5.0000 mg | Freq: Four times a day (QID) | INTRAVENOUS | Status: DC | PRN
  Filled 2021-08-26: qty 5

## 2021-08-26 MED ORDER — DILTIAZEM HCL-DEXTROSE 125-5 MG/125ML-% IV SOLN (PREMIX)
5.0000 mg/h | INTRAVENOUS | Status: DC
Start: 2021-08-26 — End: 2021-08-27
  Administered 2021-08-26: 12:00:00 5 mg/h via INTRAVENOUS
  Filled 2021-08-26: qty 125

## 2021-08-26 MED ORDER — NEPRO/CARBSTEADY PO LIQD
237.0000 mL | Freq: Two times a day (BID) | ORAL | Status: DC
  Administered 2021-08-26: 14:00:00 237 mL via ORAL
  Filled 2021-08-26: qty 237

## 2021-08-26 MED ORDER — AMIODARONE HCL IN DEXTROSE 360-4.14 MG/200ML-% IV SOLN: 30.0000 mg/h | INTRAVENOUS | Status: DC

## 2021-08-26 MED ORDER — AMIODARONE HCL IN DEXTROSE 360-4.14 MG/200ML-% IV SOLN: 60.0000 mg/h | INTRAVENOUS | Status: DC

## 2021-08-26 NOTE — TOC Progression Note (Signed)
Transition of Care [facility]) - Progression Note    Patient Details  Name: Thomas Robinson MRN: [id number] Date of Birth: [birth year]  Transition of Care [facility]) CM/SW Contact  Zenon Mayo, RN Phone Number: 08/26/2021, 4:13 PM  Clinical Narrative:     Transition of Care [facility]) Screening Note   Patient Details  Name: Thomas Robinson Date of Birth: 05-Nov-1960   Transition of Care [facility]) CM/SW Contact:    Zenon Mayo, RN Phone Number: 08/26/2021, 4:13 PM    Transition of Care Department [facility]) has reviewed patient and no TOC needs have been identified at this time. We will continue to monitor patient advancement through interdisciplinary progression rounds. If new patient transition needs arise, please place a TOC consult.          Expected Discharge Plan and Services                                                 Social Determinants of Health (SDOH) Interventions    Readmission Risk Interventions Readmission Risk Prevention Plan 01/15/2021 09/11/2020  Transportation Screening Complete Complete  Medication Review ([place]) Complete -  PCP or Specialist appointment within 3-5 days of discharge - Not Complete  PCP/Specialist Appt Not Complete comments - first available appt is 09/26/20  [place] or Home Care Consult Complete (No Data)  SW Recovery Care/Counseling Consult Complete Complete  Palliative Care Screening Not Applicable Not [place] Not Applicable Not Applicable  Some recent data might be hidden

## 2021-08-26 NOTE — ED Notes (Signed)
Consent for dialysis obtained and at bedside

## 2021-08-26 NOTE — Progress Notes (Signed)
Asked to see for ESRD.   Pt has ESRD, noncompliant primarily related to signing off early at 2- 2.5 hr mark regularly.  Here today w/ typical vol overload and SOB, no sig LE edema today but +rales on exam and +edema by CXR.  On nasal O2.  Will plan HD later today or tonight.  Also has afib w/ RVR\, started on cardizem IV I believe. Using R chest TDC.   Pt is OBV status, if changes to inpatient will do formal consultation.   OP HD: AF MWF   3.5h  400/500  52kg  2/2 bath  RIJ TDC/  L AVF unusable  - mircera 150 ug q2 last 12/21   [name], MD 08/26/2021, 12:37 PM

## 2021-08-26 NOTE — ED Notes (Signed)
Breakfast orders placed 

## 2021-08-26 NOTE — ED Notes (Signed)
This RN at bedside to collect COVID swab on pt. Pt refused swab, stating that his nose is raw from O2 cannula. This RN noted that there was some bleeding from nose. Pt placed on humidified O2  by this RN and tolerating well. Provider notified of same. Pt also repositioned in bed for comfort by this RN.

## 2021-08-26 NOTE — ED Notes (Signed)
Pt stated if I do not have a room by 5am I am leaving. This RN explained to pt that if that was the case he could leave now because he most likely would not have a bed upstairs in 45 mins. Pt stated well someone told me I would. This RN ended the conversation and left the room.

## 2021-08-26 NOTE — ED Notes (Signed)
Pt has pulled off ECG leads and will not allow anyone to hook him up to the monitor . Pt is upset saying his nurse has not been in to see him. Pt seems confused the RN and tech has been in to see this Pt.

## 2021-08-26 NOTE — ED Notes (Signed)
Pt took his blood pressure cuff off and will not keep it on. Pt still demanding more morphine this RN explained to the pt that because he will not let us take an accurate blood pressure that he would not be able to get pain meds. This RN explained that currently his blood pressure is reading to low for pain meds. Pt stated "well then just shoot me." This RN will continue to monitor.

## 2021-08-26 NOTE — ED Notes (Signed)
Pt removed pulse oximeter prob

## 2021-08-26 NOTE — ED Notes (Signed)
This RN went in to check on the pt and cut on the light so I could see the pt. Pt yelled at this RN "to cut the fucking lights off" This RN explained to the pt she could not see in the dark and would cut the light off when she finished. Pt started demanding morphine and stated "yall wont leave anybody alone will you? You can't get sleep in this hospital." This RN explained to pt that this was not a place for rest or to be left alone that he was at the hospital and we are here to take care of his medical emergency and that requires [place] to check on him frequently. Pt stated "well then give me my morphine."

## 2021-08-26 NOTE — ED Notes (Addendum)
Pt noted to be agitated when this RN at bedside to re-attempt administration of Lopressor PO, per attending MD request. Pt placed pills in mouth and aggressively handed water cup towards this RN stating "I can't drink this its too f** hot". This RN provided ice water for pt, and pt able to orally tolerate medication. Will monitor for effects.

## 2021-08-26 NOTE — ED Notes (Signed)
Notified by phlebotomy that pt refused to be stuck for labs. Will attempt to pull labs from IV line

## 2021-08-26 NOTE — ED Notes (Signed)
Pt noted to continually pull off monitoring equipment after having it reapplied by ED staff. This RN at bedside to speak with pt about importance of vital sign monitoring. Pt initially belligerent with this RN, stating that "he cannot get any fucking help in this hospital", that "he should have went to [facility]" and "he will get up and walk the fuck out". This RN expressed concerned that pt needed further care, but that pt has the right to leave at his discretion. Pt eventually able to be situated after being repositioned in bed and having linens straightened. Pt then stated he would not like to be woken up any more tonight. This RN educated pt that staff will only come in room when it is necessary for his care.

## 2021-08-26 NOTE — ED Notes (Signed)
Zhang MD notified of pt uncooperativeness in letting staff obtain BP on upper extremities. Notified MD that pt can't be properly monitored d/t not letting staff monitor his BP appropriately. Per MD, give diltiazem despite difficulties w/ pt and obtaining BP.

## 2021-08-26 NOTE — ED Notes (Signed)
Pt agreeable at this time to allow staff to obtain his BP on his L upper arm every 30 minutes. Initially pt told this RN to take his BP on his R ankle. This RN educated pt that d/t cardizem infusion, his BP needs to be accurate and monitored closely d/t hypotensive effects of medicine. Zhang MD notified.

## 2021-08-26 NOTE — ED Notes (Signed)
Pt resting in bed in NAD. Pt refusing to allow staff to obtain BP on upper extremities despite having old fistulas/grafts and not having arm restrictions. Pt told this RN that the cuff has to go on his ankle. VSS other than A-fib 120s and BP not accurately measuring w/ a reading of 86/25. Admitting team is aware of pt non-compliance and uncooperative w/ treatment.

## 2021-08-26 NOTE — ED Notes (Signed)
This RN at bedside to collect repeat labs. Pt expressed displeasure with having blood drawn and stated to this RN "this is it, I'm not giving anymore blood or being stuck with any needle for the rest of the night, you might as well let everyone else know because this is it". RN notified pt that his request will be documented.

## 2021-08-26 NOTE — ED Notes (Signed)
Nephrology at bedside

## 2021-08-26 NOTE — ED Notes (Signed)
The pt has refused lab draw.

## 2021-08-26 NOTE — Progress Notes (Signed)
PROGRESS NOTE    Thomas Robinson  QBH:[id number] DOB: 07/18/1961 DOA: 08/25/2021 PCP: [name], PA   Chief complaint.  Shortness of breath. Brief Narrative:  Thomas Robinson is a 60 y.o. male with history of ESRD on hemodialysis on Monday Wednesday Friday, diastolic CHF, atrial fibrillation, thoracic aortic aneurysm, COPD/asthma presents to the ER because of increasing shortness of breath since yesterday morning. In the emergency room, patient had atrial fibrillation with RVR, chest x-ray showed volume overload with pleural effusion.  Patient has been seen by nephrology, will be dialyzed today.   Assessment & Plan:   Principal Problem:   Fluid overload Active Problems:   ESRD on dialysis ([place])   Acute exacerbation of congestive heart failure (HCC)   Acute on chronic diastolic CHF (congestive heart failure) (HCC)   Decubitus ulcer of coccyx, stage 2 (HCC)   Atrial fibrillation with RVR (HCC)  Acute hypoxemic respiratory failure secondary to volume overload. Acute on chronic diastolic congestive heart failure. End-stage renal disease. Reviewed echocardiogram performed 08/12/2021.  Grade 1 diastolic dysfunction with ejection fraction 60 to 65%. Patient is profoundly volume overloaded at this time, patient will be taken to hemodialysis today per nephrology. Continue oxygen treatment per  Chronic persistent atrial fibrillation with rapid ventricle response. Patient has a long QT interval, will avoid amiodarone drip.  Patient is placed on diltiazem drip instead. Patient is also receiving as needed metoprolol going to dialysis today. Will continue monitor closely. On Eliquis  Aortic aneurysm. Repeated CT scan did not show any dissection or acute complication.  Subsegmental PE. CT scan showed a small subsegmental PE.  On Eliquis.  COPD. No bronchospasm.  Chronic kidney disease. Continue follow-up  Severe protein calorie malnutrition. Add a protein  supplements  Patient has requested to see hospice.  Social worker is notified.   DVT prophylaxis: Eliquis Code Status: full Family Communication:  Disposition Plan:    Status is: Observation  The patient will require care spanning > 2 midnights and should be moved to inpatient because: severity of disease       No intake/output data recorded. No intake/output data recorded.     Consultants:  Nephrology  Procedures: HD  Antimicrobials: none  Subjective: Patient still complaining shortness of breath at rest.  No cough. Does not have any abdominal pain nausea or vomiting. No fever or chills Had some chest pain, which has resolved.  Objective: Vitals:   08/26/21 0830 08/26/21 1059 08/26/21 1100 08/26/21 1200  BP: 120/83 (!) 132/97 (!) 132/97 (!) 146/90  Pulse: (!) 114 (!) 120 (!) 124 (!) 129  Resp: (!) 24  17 (!) 25  Temp:      SpO2: 99% 97% 97% 99%   No intake or output data in the 24 hours ending 08/26/21 1257 There were no vitals filed for this visit.  Examination:  General exam: Appears calm and comfortable, severely malnourished. Respiratory system: Crackles in the bases.[name] Respiratory effort normal. Cardiovascular system: Irregularly irregular, Tachycardic.  No JVD, murmurs, rubs, gallops or clicks. No pedal edema. Gastrointestinal system: Abdomen is nondistended, soft and nontender. No organomegaly or masses felt. Normal bowel sounds heard. Central nervous system: Alert and oriented. No focal neurological deficits. Extremities: Symmetric  Skin: No rashes, lesions or ulcers Psychiatry: Judgement and insight appear normal. Mood & affect appropriate.     Data Reviewed: I have personally reviewed following labs and imaging studies  CBC: Recent Labs  Lab 08/25/21 1930 08/26/21 1059  WBC 5.1 4.8  NEUTROABS 3.6  --  HGB 8.9* 9.7*  HCT 28.2* 32.0*  MCV 105.2* 108.8*  PLT 194 027   Basic Metabolic Panel: Recent Labs  Lab 08/25/21 1930  NA 139   K 3.8  CL 98  CO2 26  GLUCOSE 112*  BUN 42*  CREATININE 6.48*   6.37*  CALCIUM 10.1   GFR: CrCl cannot be calculated (Unknown ideal weight.). Liver Function Tests: Recent Labs  Lab 08/25/21 1930  AST 20  ALT 8  ALKPHOS 101  BILITOT 1.1  PROT 6.4*  ALBUMIN 2.6*   No results for input(s): LIPASE, AMYLASE in the last 168 hours. No results for input(s): AMMONIA in the last 168 hours. Coagulation Profile: No results for input(s): INR, PROTIME in the last 168 hours. Cardiac Enzymes: No results for input(s): CKTOTAL, CKMB, CKMBINDEX, TROPONINI in the last 168 hours. BNP (last 3 results) No results for input(s): PROBNP in the last 8760 hours. HbA1C: No results for input(s): HGBA1C in the last 72 hours. CBG: No results for input(s): GLUCAP in the last 168 hours. Lipid Profile: No results for input(s): CHOL, HDL, LDLCALC, TRIG, CHOLHDL, LDLDIRECT in the last 72 hours. Thyroid Function Tests: No results for input(s): TSH, T4TOTAL, FREET4, T3FREE, THYROIDAB in the last 72 hours. Anemia Panel: No results for input(s): VITAMINB12, FOLATE, FERRITIN, TIBC, IRON, RETICCTPCT in the last 72 hours. Sepsis Labs: No results for input(s): PROCALCITON, LATICACIDVEN in the last 168 hours.  Recent Results (from the past 240 hour(s))  Resp Panel by RT-PCR (Flu A&B, Covid) Nasopharyngeal Swab     Status: None   Collection Time: 08/25/21 10:50 PM   Specimen: Nasopharyngeal Swab; Nasopharyngeal(NP) swabs in vial transport medium  Result Value Ref Range Status   SARS Coronavirus 2 by RT PCR NEGATIVE NEGATIVE Final    Comment: (NOTE) SARS-CoV-2 target nucleic acids are NOT DETECTED.  The SARS-CoV-2 RNA is generally detectable in upper respiratory specimens during the acute phase of infection. The lowest concentration of SARS-CoV-2 viral copies this assay can detect is 138 copies/mL. A negative result does not preclude SARS-Cov-2 infection and should not be used as the sole basis for  treatment or other patient management decisions. A negative result may occur with  improper specimen collection/handling, submission of specimen other than nasopharyngeal swab, presence of viral mutation(s) within the areas targeted by this assay, and inadequate number of viral copies(<138 copies/mL). A negative result must be combined with clinical observations, patient history, and epidemiological information. The expected result is Negative.  Fact Sheet for Patients:  EntrepreneurPulse.com.au  Fact Sheet for Healthcare Providers:  IncredibleEmployment.be  This test is no t yet approved or cleared by the [place] FDA and  has been authorized for detection and/or diagnosis of SARS-CoV-2 by FDA under an Emergency Use Authorization (EUA). This EUA will remain  in effect (meaning this test can be used) for the duration of the COVID-19 declaration under Section 564(b)(1) of the Act, 21 U.S.C.section 360bbb-3(b)(1), unless the authorization is terminated  or revoked sooner.       Influenza A by PCR NEGATIVE NEGATIVE Final   Influenza B by PCR NEGATIVE NEGATIVE Final    Comment: (NOTE) The Xpert Xpress SARS-CoV-2/FLU/RSV plus assay is intended as an aid in the diagnosis of influenza from Nasopharyngeal swab specimens and should not be used as a sole basis for treatment. Nasal washings and aspirates are unacceptable for Xpert Xpress SARS-CoV-2/FLU/RSV testing.  Fact Sheet for Patients: EntrepreneurPulse.com.au  Fact Sheet for Healthcare Providers: IncredibleEmployment.be  This test is not yet approved or  cleared by the [place] and has been authorized for detection and/or diagnosis of SARS-CoV-2 by FDA under an Emergency Use Authorization (EUA). This EUA will remain in effect (meaning this test can be used) for the duration of the COVID-19 declaration under Section 564(b)(1) of the Act, 21  U.S.C. section 360bbb-3(b)(1), unless the authorization is terminated or revoked.  Performed at [facility], [place] [address], [place], [place] [address]          Radiology Studies: DG Chest 1 View  Result Date: 08/25/2021 CLINICAL DATA:  Chest pain since last night, end-stage renal disease on hemodialysis, nonproductive cough, CHF, COPD, hypertension, asthma EXAM: CHEST  1 VIEW COMPARISON:  Portable exam 1842 hours compared to 08/11/2021 FINDINGS: RIGHT jugular dual lumen central venous catheter with tip projecting over SVC. Enlargement of cardiac silhouette with pulmonary vascular congestion. Diffuse pulmonary infiltrates increased from previous exam consistent with pulmonary edema. Persistent RIGHT pleural effusion and basilar atelectasis. No pneumothorax. Atherosclerotic calcification aortic arch. Prior cervical spine fusion. IMPRESSION: Pulmonary edema consistent with CHF versus fluid overload, increased from previous exam. Persistent RIGHT pleural effusion and basilar atelectasis. Aortic Atherosclerosis (ICD10-I70.0). Electronically Signed   By: Lavonia Dana M.D.   On: 08/25/2021 18:49   CT ANGIO CHEST AORTA W/CM & OR WO/CM  Addendum Date: 08/26/2021   ADDENDUM REPORT: 08/26/2021 08:08 ADDENDUM: Study discussed by telephone with Dr. Roosevelt Locks on 08/26/2021 at 0802 hours. Electronically Signed   By: Genevie Ann M.D.   On: 08/26/2021 08:08   Result Date: 08/26/2021 CLINICAL DATA:  60 year old male with chest pain and shortness of breath. Acute aortic syndrome suspected. EXAM: CT ANGIOGRAPHY CHEST WITH CONTRAST TECHNIQUE: Multidetector CT imaging of the chest was performed using the standard protocol both prior to and during bolus administration of intravenous contrast. Multiplanar CT image reconstructions and MIPs were obtained to evaluate the vascular anatomy. CONTRAST:  60mL OMNIPAQUE IOHEXOL 350 MG/ML SOLN COMPARISON:  CTA chest 06/30/2021. FINDINGS: Cardiovascular: Study designed for  aortic IV contrast timing but there is also good opacification of the bilateral pulmonary arteries. Superimposed respiratory motion. Small volume thrombus in right upper lobe pulmonary artery branches, primarily nonocclusive series 6 images 69 through 71. No other convincing focal filling defect identified in the pulmonary arteries. Stable tortuosity and mild fusiform enlargement of the ascending aorta up to 42 mm diameter. Negative for aortic dissection or saccular aneurysm. Mild-to-moderate Calcified aortic atherosclerosis. Calcified coronary artery atherosclerosis and/or stents. Stable cardiomegaly. No pericardial effusion. Mediastinum/Nodes: Negative. No mediastinal mass or lymphadenopathy. Lungs/Pleura: Moderate layering right pleural effusion has not significantly changed since October. There is confluent and enhancing compressive atelectasis in the dependent right upper lobe and right lower lobe. Smaller left pleural effusion with superimposed mild pleural thickening and calcification is stable. Stable associated left lower lobe atelectasis. Major airways remain patent. Diffuse superimposed centrilobular pulmonary ground-glass opacity in both lungs. Upper Abdomen: Negative visible liver, spleen, adrenal glands and bowel in the upper abdomen. Partially visible renal atrophy. Musculoskeletal: Previous lower cervical ACDF. Midthoracic flowing endplate osteophytes and ankylosis. Generalized increased bony sclerosis suggesting renal osteodystrophy. No acute or suspicious osseous lesion. Review of the MIP images confirms the above findings. IMPRESSION: 1. Positive for small and primarily nonocclusive right upper lobe pulmonary embolus (series 6, image 71), overall clinical significance doubtful. 2. Negative for acute aortic syndrome. Stable borderline aneurysmal Ascending thoracic aorta. Recommend annual imaging followup by CTA or MRA as detailed on October CTA. Aortic aneurysm NOS (ICD10-I71.9) Aortic  Atherosclerosis (ICD10-I70.0). 3.  Ongoing diffuse pulmonary ground-glass opacity suspicious for Acute Pulmonary Edema. Not significantly changed from October superimposed moderate layering right and small left pleural effusions with compressive atelectasis. 4. Partially visible renal atrophy.  Renal osteodystrophy. Electronically Signed: By: Genevie Ann M.D. On: 08/26/2021 07:00        Scheduled Meds:  apixaban  2.5 mg Oral BID   Chlorhexidine Gluconate Cloth  6 each Topical Q0600   metoprolol tartrate  50 mg Oral BID   multivitamin  1 tablet Oral QHS   sevelamer carbonate  1,600 mg Oral TID WC   Continuous Infusions:  diltiazem (CARDIZEM) infusion 5 mg/hr (08/26/21 1215)     LOS: 0 days    Time spent: 32 minutes    Sharen Hones, MD [facility]   To contact the attending provider between 7A-7P or the covering provider during after hours 7P-7A, please log into the web site [email or web address] and access using universal [place] password for that web site. If you do not have the password, please call the hospital operator.  08/26/2021, 12:57 PM

## 2021-08-27 DIAGNOSIS — I5023 Acute on chronic systolic (congestive) heart failure: Secondary | ICD-10-CM

## 2021-08-27 LAB — CBC
HCT: 29 % — ABNORMAL LOW (ref 39.0–52.0)
Hemoglobin: 9.4 g/dL — ABNORMAL LOW (ref 13.0–17.0)
MCH: 33.3 pg (ref 26.0–34.0)
MCHC: 32.4 g/dL (ref 30.0–36.0)
MCV: 102.8 fL — ABNORMAL HIGH (ref 80.0–100.0)
Platelets: 206 10*3/uL (ref 150–400)
RBC: 2.82 MIL/uL — ABNORMAL LOW (ref 4.22–5.81)
RDW: 16.3 % — ABNORMAL HIGH (ref 11.5–15.5)
WBC: 3.9 10*3/uL — ABNORMAL LOW (ref 4.0–10.5)
nRBC: 0 % (ref 0.0–0.2)

## 2021-08-27 LAB — RENAL FUNCTION PANEL
Albumin: 2.5 g/dL — ABNORMAL LOW (ref 3.5–5.0)
Anion gap: 18 — ABNORMAL HIGH (ref 5–15)
BUN: 64 mg/dL — ABNORMAL HIGH (ref 8–23)
CO2: 22 mmol/L (ref 22–32)
Calcium: 9.7 mg/dL (ref 8.9–10.3)
Chloride: 95 mmol/L — ABNORMAL LOW (ref 98–111)
Creatinine, Ser: 8.27 mg/dL — ABNORMAL HIGH (ref 0.61–1.24)
GFR, Estimated: 7 mL/min — ABNORMAL LOW (ref >60)
Glucose, Bld: 133 mg/dL — ABNORMAL HIGH (ref 70–99)
Phosphorus: 10 mg/dL — ABNORMAL HIGH (ref 2.5–4.6)
Potassium: 4.5 mmol/L (ref 3.5–5.1)
Sodium: 135 mmol/L (ref 135–145)

## 2021-08-27 LAB — TROPONIN I (HIGH SENSITIVITY)
Troponin I (High Sensitivity): 123 ng/L (ref <18)
Troponin I (High Sensitivity): 127 ng/L (ref <18)
Troponin I (High Sensitivity): 116 ng/L (ref <18)

## 2021-08-27 MED ORDER — HEPARIN SODIUM (PORCINE) 1000 UNIT/ML DIALYSIS
1000.0000 [IU] | INTRAMUSCULAR | Status: DC | PRN
  Administered 2021-08-27: 1000 [IU] via INTRAVENOUS_CENTRAL
  Filled 2021-08-27: qty 1

## 2021-08-27 MED ORDER — GUAIFENESIN-DM 100-10 MG/5ML PO SYRP
5.0000 mL | ORAL_SOLUTION | ORAL | Status: DC | PRN
  Administered 2021-08-28: 01:00:00 5 mL via ORAL
  Filled 2021-08-27: qty 5

## 2021-08-27 MED ORDER — LIDOCAINE-PRILOCAINE 2.5-2.5 % EX CREA: 1.0000 "application " | TOPICAL_CREAM | CUTANEOUS | Status: DC | PRN

## 2021-08-27 MED ORDER — PENTAFLUOROPROP-TETRAFLUOROETH EX AERO: 1.0000 "application " | INHALATION_SPRAY | CUTANEOUS | Status: DC | PRN

## 2021-08-27 MED ORDER — ALBUMIN HUMAN 25 % IV SOLN
12.5000 g | Freq: Once | INTRAVENOUS | Status: AC
  Administered 2021-08-27: 12.5 g via INTRAVENOUS

## 2021-08-27 MED ORDER — ALTEPLASE 2 MG IJ SOLR: 2.0000 mg | Freq: Once | INTRAMUSCULAR | Status: DC | PRN

## 2021-08-27 MED ORDER — LIDOCAINE HCL (PF) 1 % IJ SOLN: 5.0000 mL | INTRAMUSCULAR | Status: DC | PRN

## 2021-08-27 MED ORDER — ONDANSETRON HCL 4 MG/2ML IJ SOLN
4.0000 mg | Freq: Four times a day (QID) | INTRAMUSCULAR | Status: DC | PRN
  Administered 2021-08-27: 4 mg via INTRAVENOUS
  Filled 2021-08-27: qty 2

## 2021-08-27 MED ORDER — SODIUM CHLORIDE 0.9 % IV SOLN: 100.0000 mL | INTRAVENOUS | Status: DC | PRN

## 2021-08-27 MED ORDER — ALBUMIN HUMAN 25 % IV SOLN
25.0000 g | Freq: Once | INTRAVENOUS | Status: AC
  Administered 2021-08-27: 17:00:00 25 g via INTRAVENOUS
  Filled 2021-08-27: qty 100

## 2021-08-27 MED ORDER — ALBUMIN HUMAN 25 % IV SOLN
INTRAVENOUS | Status: AC
  Filled 2021-08-27: qty 50

## 2021-08-27 NOTE — Progress Notes (Signed)
Pt states that he is fed up with waiting for HD. He states that if he does not go for HD by 0600 that he will be leaving without signing anything and calling the news 2 channel to report the length of time he's been waiting. This RN has coordinated with HD. MD aware of potential D/C AMA.

## 2021-08-27 NOTE — Progress Notes (Signed)
Pt is getting a fit on being stuck d/t labs ordered. Explained about his Troponin was high and needed to have series of check. Phlebotomist was able to collect blood by finger stick- should be enough for Trop. But if not. Patient will refuse to be stuck again.

## 2021-08-27 NOTE — Progress Notes (Signed)
Upon shift assessment, pt was found to have no dressing on HD catheter. This RN cleaned the site carefully and applied sterile dressing to catheter. Pt education done. Pt states dressing "just fell off" and does not know how long it had been

## 2021-08-27 NOTE — Progress Notes (Signed)
PROGRESS NOTE    Thomas Robinson  OJJ:[id number] DOB: 10/11/1960 DOA: 08/25/2021 PCP: [name], PA   Chief complaint.  Shortness of breath. Brief Narrative:  Thomas Robinson is a 60 y.o. male with history of ESRD on hemodialysis on Monday Wednesday Friday, diastolic CHF, atrial fibrillation, thoracic aortic aneurysm, COPD/asthma presents to the ER because of increasing shortness of breath since yesterday morning. In the emergency room, patient had atrial fibrillation with RVR, chest x-ray showed volume overload with pleural effusion.  Patient has been seen by nephrology, started on daily dialysis.  Assessment & Plan:   Principal Problem:   Fluid overload Active Problems:   ESRD on dialysis ([place])   Acute exacerbation of congestive heart failure (HCC)   Acute on chronic diastolic CHF (congestive heart failure) (HCC)   Decubitus ulcer of coccyx, stage 2 (HCC)   Atrial fibrillation with RVR (HCC)   Acute on chronic diastolic (congestive) heart failure (HCC)  Acute hypoxemic respiratory failure secondary to volume overload. Acute on chronic diastolic congestive heart failure. End-stage renal disease. Reviewed echocardiogram performed 08/12/2021.  Grade 1 diastolic dysfunction with ejection fraction 60 to 65%. Patient had profound volume overload at time admission, currently receiving daily dialysis.  Continue wean oxygen.  Chronic persistent atrial fibrillation with rapid ventricle response. Patient received IV metoprolol, heart rate much better. On Eliquis  Aortic aneurysm. Repeated CT scan did not show any dissection or acute complication.  Subsegmental PE. CT scan showed a small subsegmental PE.  On Eliquis.  COPD. No bronchospasm.  Chronic kidney disease. Continue follow-up  Severe protein calorie malnutrition. Continue protein supplements.   Discussed with nephrology, patient has poor prognosis for long-term.  Will obtain palliative care consult.    DVT  prophylaxis: Eliquis Code Status: full Family Communication:  Disposition Plan:    Status is: Inpatient  Remains inpatient appropriate because: Severity of disease, requiring daily dialysis for volume overload.        I/O last 3 completed shifts: In: 1209.4 [P.O.:960; I.V.:12.4; NG/GT:237] Out: -  No intake/output data recorded.     Consultants:  Nephrology  Procedures: HD  Antimicrobials: None  Subjective: Feels better today, short of breath improving.  Still requiring oxygen. Denies any abdominal pain nausea vomiting. No dysuria hematuria  No fever or chills.  Objective: Vitals:   08/27/21 0900 08/27/21 0930 08/27/21 1000 08/27/21 1030  BP: (!) 101/58 97/64 95/61  109/69  Pulse: 61 81 77 70  Resp:      Temp:      TempSrc:      SpO2:      Weight:      Height:        Intake/Output Summary (Last 24 hours) at 08/27/2021 1053 Last data filed at 08/27/2021 0521 Gross per 24 hour  Intake 1209.43 ml  Output --  Net 1209.43 ml   Filed Weights   08/26/21 1443 08/27/21 0455 08/27/21 0806  Weight: 53 kg 52.5 kg 53 kg    Examination:  General exam: Appears calm and comfortable, appears severely malnourished. Respiratory system: Clear to auscultation. Respiratory effort normal. Cardiovascular system: S1 & S2 heard, RRR. No JVD, murmurs, rubs, gallops or clicks. No pedal edema. Gastrointestinal system: Abdomen is nondistended, soft and nontender. No organomegaly or masses felt. Normal bowel sounds heard. Central nervous system: Alert and oriented. No focal neurological deficits. Extremities: Symmetric 5 x 5 power. Skin: No rashes, lesions or ulcers Psychiatry: Judgement and insight appear normal. Mood & affect appropriate.     Data  Reviewed: I have personally reviewed following labs and imaging studies  CBC: Recent Labs  Lab 08/25/21 1930 08/26/21 1059  WBC 5.1 4.8  NEUTROABS 3.6  --   HGB 8.9* 9.7*  HCT 28.2* 32.0*  MCV 105.2* 108.8*  PLT 194  188   Basic Metabolic Panel: Recent Labs  Lab 08/25/21 1930 08/26/21 1059 08/27/21 0832  NA 139 136 135  K 3.8 4.5 4.5  CL 98 96* 95*  CO2 26 20* 22  GLUCOSE 112* 62* 133*  BUN 42* 48* 64*  CREATININE 6.48*   6.37* 6.70* 8.27*  CALCIUM 10.1 9.8 9.7  PHOS  --   --  10.0*   GFR: Estimated Creatinine Clearance: 6.9 mL/min (A) (by C-G formula based on SCr of 8.27 mg/dL (H)). Liver Function Tests: Recent Labs  Lab 08/25/21 1930 08/27/21 0832  AST 20  --   ALT 8  --   ALKPHOS 101  --   BILITOT 1.1  --   PROT 6.4*  --   ALBUMIN 2.6* 2.5*   No results for input(s): LIPASE, AMYLASE in the last 168 hours. No results for input(s): AMMONIA in the last 168 hours. Coagulation Profile: No results for input(s): INR, PROTIME in the last 168 hours. Cardiac Enzymes: No results for input(s): CKTOTAL, CKMB, CKMBINDEX, TROPONINI in the last 168 hours. BNP (last 3 results) No results for input(s): PROBNP in the last 8760 hours. HbA1C: No results for input(s): HGBA1C in the last 72 hours. CBG: No results for input(s): GLUCAP in the last 168 hours. Lipid Profile: No results for input(s): CHOL, HDL, LDLCALC, TRIG, CHOLHDL, LDLDIRECT in the last 72 hours. Thyroid Function Tests: Recent Labs    08/26/21 1059  TSH 5.097*   Anemia Panel: No results for input(s): VITAMINB12, FOLATE, FERRITIN, TIBC, IRON, RETICCTPCT in the last 72 hours. Sepsis Labs: No results for input(s): PROCALCITON, LATICACIDVEN in the last 168 hours.  Recent Results (from the past 240 hour(s))  Resp Panel by RT-PCR (Flu A&B, Covid) Nasopharyngeal Swab     Status: None   Collection Time: 08/25/21 10:50 PM   Specimen: Nasopharyngeal Swab; Nasopharyngeal(NP) swabs in vial transport medium  Result Value Ref Range Status   SARS Coronavirus 2 by RT PCR NEGATIVE NEGATIVE Final    Comment: (NOTE) SARS-CoV-2 target nucleic acids are NOT DETECTED.  The SARS-CoV-2 RNA is generally detectable in upper  respiratory specimens during the acute phase of infection. The lowest concentration of SARS-CoV-2 viral copies this assay can detect is 138 copies/mL. A negative result does not preclude SARS-Cov-2 infection and should not be used as the sole basis for treatment or other patient management decisions. A negative result may occur with  improper specimen collection/handling, submission of specimen other than nasopharyngeal swab, presence of viral mutation(s) within the areas targeted by this assay, and inadequate number of viral copies(<138 copies/mL). A negative result must be combined with clinical observations, patient history, and epidemiological information. The expected result is Negative.  Fact Sheet for Patients:  EntrepreneurPulse.com.au  Fact Sheet for Healthcare Providers:  IncredibleEmployment.be  This test is no t yet approved or cleared by the [place] FDA and  has been authorized for detection and/or diagnosis of SARS-CoV-2 by FDA under an Emergency Use Authorization (EUA). This EUA will remain  in effect (meaning this test can be used) for the duration of the COVID-19 declaration under Section 564(b)(1) of the Act, 21 U.S.C.section 360bbb-3(b)(1), unless the authorization is terminated  or revoked sooner.  Influenza A by PCR NEGATIVE NEGATIVE Final   Influenza B by PCR NEGATIVE NEGATIVE Final    Comment: (NOTE) The Xpert Xpress SARS-CoV-2/FLU/RSV plus assay is intended as an aid in the diagnosis of influenza from Nasopharyngeal swab specimens and should not be used as a sole basis for treatment. Nasal washings and aspirates are unacceptable for Xpert Xpress SARS-CoV-2/FLU/RSV testing.  Fact Sheet for Patients: EntrepreneurPulse.com.au  Fact Sheet for Healthcare Providers: IncredibleEmployment.be  This test is not yet approved or cleared by the [place] FDA and has been  authorized for detection and/or diagnosis of SARS-CoV-2 by FDA under an Emergency Use Authorization (EUA). This EUA will remain in effect (meaning this test can be used) for the duration of the COVID-19 declaration under Section 564(b)(1) of the Act, 21 U.S.C. section 360bbb-3(b)(1), unless the authorization is terminated or revoked.  Performed at [facility], [place] [address], [place], [place] [address]          Radiology Studies: DG Chest 1 View  Result Date: 08/25/2021 CLINICAL DATA:  Chest pain since last night, end-stage renal disease on hemodialysis, nonproductive cough, CHF, COPD, hypertension, asthma EXAM: CHEST  1 VIEW COMPARISON:  Portable exam 1842 hours compared to 08/11/2021 FINDINGS: RIGHT jugular dual lumen central venous catheter with tip projecting over SVC. Enlargement of cardiac silhouette with pulmonary vascular congestion. Diffuse pulmonary infiltrates increased from previous exam consistent with pulmonary edema. Persistent RIGHT pleural effusion and basilar atelectasis. No pneumothorax. Atherosclerotic calcification aortic arch. Prior cervical spine fusion. IMPRESSION: Pulmonary edema consistent with CHF versus fluid overload, increased from previous exam. Persistent RIGHT pleural effusion and basilar atelectasis. Aortic Atherosclerosis (ICD10-I70.0). Electronically Signed   By: Lavonia Dana M.D.   On: 08/25/2021 18:49   CT ANGIO CHEST AORTA W/CM & OR WO/CM  Addendum Date: 08/26/2021   ADDENDUM REPORT: 08/26/2021 08:08 ADDENDUM: Study discussed by telephone with Dr. Roosevelt Locks on 08/26/2021 at 0802 hours. Electronically Signed   By: Genevie Ann M.D.   On: 08/26/2021 08:08   Result Date: 08/26/2021 CLINICAL DATA:  60 year old male with chest pain and shortness of breath. Acute aortic syndrome suspected. EXAM: CT ANGIOGRAPHY CHEST WITH CONTRAST TECHNIQUE: Multidetector CT imaging of the chest was performed using the standard protocol both prior to and during bolus  administration of intravenous contrast. Multiplanar CT image reconstructions and MIPs were obtained to evaluate the vascular anatomy. CONTRAST:  30mL OMNIPAQUE IOHEXOL 350 MG/ML SOLN COMPARISON:  CTA chest 06/30/2021. FINDINGS: Cardiovascular: Study designed for aortic IV contrast timing but there is also good opacification of the bilateral pulmonary arteries. Superimposed respiratory motion. Small volume thrombus in right upper lobe pulmonary artery branches, primarily nonocclusive series 6 images 69 through 71. No other convincing focal filling defect identified in the pulmonary arteries. Stable tortuosity and mild fusiform enlargement of the ascending aorta up to 42 mm diameter. Negative for aortic dissection or saccular aneurysm. Mild-to-moderate Calcified aortic atherosclerosis. Calcified coronary artery atherosclerosis and/or stents. Stable cardiomegaly. No pericardial effusion. Mediastinum/Nodes: Negative. No mediastinal mass or lymphadenopathy. Lungs/Pleura: Moderate layering right pleural effusion has not significantly changed since October. There is confluent and enhancing compressive atelectasis in the dependent right upper lobe and right lower lobe. Smaller left pleural effusion with superimposed mild pleural thickening and calcification is stable. Stable associated left lower lobe atelectasis. Major airways remain patent. Diffuse superimposed centrilobular pulmonary ground-glass opacity in both lungs. Upper Abdomen: Negative visible liver, spleen, adrenal glands and bowel in the upper abdomen. Partially visible renal atrophy. Musculoskeletal: Previous lower cervical  ACDF. Midthoracic flowing endplate osteophytes and ankylosis. Generalized increased bony sclerosis suggesting renal osteodystrophy. No acute or suspicious osseous lesion. Review of the MIP images confirms the above findings. IMPRESSION: 1. Positive for small and primarily nonocclusive right upper lobe pulmonary embolus (series 6, image 71),  overall clinical significance doubtful. 2. Negative for acute aortic syndrome. Stable borderline aneurysmal Ascending thoracic aorta. Recommend annual imaging followup by CTA or MRA as detailed on October CTA. Aortic aneurysm NOS (ICD10-I71.9) Aortic Atherosclerosis (ICD10-I70.0). 3. Ongoing diffuse pulmonary ground-glass opacity suspicious for Acute Pulmonary Edema. Not significantly changed from October superimposed moderate layering right and small left pleural effusions with compressive atelectasis. 4. Partially visible renal atrophy.  Renal osteodystrophy. Electronically Signed: By: Genevie Ann M.D. On: 08/26/2021 07:00        Scheduled Meds:  apixaban  2.5 mg Oral BID   Chlorhexidine Gluconate Cloth  6 each Topical Q0600   feeding supplement (NEPRO CARB STEADY)  237 mL Oral BID BM   metoprolol tartrate  50 mg Oral BID   multivitamin  1 tablet Oral QHS   sevelamer carbonate  1,600 mg Oral TID WC   Continuous Infusions:  sodium chloride     sodium chloride     albumin human       LOS: 1 day    Time spent: 28 minutes    Sharen Hones, MD [facility]   To contact the attending provider between 7A-7P or the covering provider during after hours 7P-7A, please log into the web site [email or web address] and access using universal [place] password for that web site. If you do not have the password, please call the hospital operator.  08/27/2021, 10:53 AM

## 2021-08-27 NOTE — Progress Notes (Signed)
Pt wanting to leave AMA, had not received dialysis yet. This RN made pt aware that he will have to be able to assist himself out of the hospital. Pt will wait for family to come get him and order breakfast. Pt demanding to speak to doctor. Pts nephrologist was contacted with a message left and secretary saying they will call back.

## 2021-08-27 NOTE — Consult Note (Signed)
[facility] Renal Consultation Note    Indication for Consultation:  Management of ESRD/hemodialysis; anemia, hypertension/volume and secondary hyperparathyroidism  ATF:TDDUKG, Desiree, PA  HPI: Thomas Robinson is a 60 y.o. male with ESRD on HD MWF at [facility]. His past medical history significant for diastolic CHF, atrial fibrillation, thoracic aortic aneurysm, and COPD/asthma.  Patient presents back to the ED c/o increase SOB. Upon arrival to the ED, patient found to be in  Afib with RVR. Cardizem was initiated at admit but now discontinued. Currently on PO Metoprolol BID. Patient with chronic history non-compliance with HD treatments by continuously leaving HD treatments early. Last HD was on 08/23/21-only received HD for 2 hours. CXR shows volume overload with pleural effusion. Seen and examined patient on HD. Only able to tolerate UF 1L d/t low blood pressures. IV Albumin given during today's treatment. He denied SOB, CP, and N/V during HD. Plan for sequential HD tomorrow 08/28/21 in hopes to remove more fluid.  Past Medical History:  Diagnosis Date   Anaphylactic shock, unspecified, sequela 06/10/2019   Aortic stenosis    mild-moderate AS 12/2020 echo   Arthritis    Asthma, chronic, unspecified asthma severity, with acute exacerbation 10/23/2017   CAP (community acquired pneumonia) 10/23/2017   Carpal tunnel syndrome of right wrist 10/05/2018   Cataract    right - removed by surgery   Cerebral infarction due to thrombosis of cerebral artery (HCC)    Cervical disc herniation 01/04/2020   Chronic diastolic heart failure ([place]) 04/13/2018   Chronic low back pain 11/24/2019   Constipation    COPD (chronic obstructive pulmonary disease) (HCC)    Cough    chronic cough   Dyspnea    Encounter for immunization 07/08/2017   ESRD on hemodialysis ([place]) 02/16/2018   Fall 06/04/2019   GERD (gastroesophageal reflux disease) 06/04/2019   GI bleeding 05/24/2017    Gram-negative sepsis, unspecified ([place]) 09/05/2017   History of fusion of cervical spine 03/26/2020   Hyperlipidemia    Hypertension    Hypokalemia 06/04/2017   Iron deficiency anemia, unspecified 09/09/2017   Left shoulder pain 11/11/2019   Lumbar radiculopathy 11/24/2019   Lung contusion 06/04/2019   LVH (left ventricular hypertrophy) due to hypertensive disease, with heart failure ([place]) 06/18/2020   Macrocytic anemia 05/24/2017   Moderate protein-calorie malnutrition ([place]) 06/06/2017   Myofascial pain syndrome 06/12/2020   Neuritis of right ulnar nerve 09/13/2018   Non-compliance with renal dialysis ([place]) 02/08/2020   Oxygen deficiency 12/30/2019   O2 sats on RA 87% at PAT appt    Pain in joint of right elbow 09/13/2018   Pulmonary hypertension ([place])    Renovascular hypertension 06/22/2020   Restless leg syndrome    Rib fractures 06/2019   Right   S/P cardiac cath 08/27/2020   normal coronary arteries.   Secondary hyperparathyroidism of renal origin ([place]) 06/04/2017   Thoracic ascending aortic aneurysm    4.5 cm 06/04/19 CT   Ulnar neuropathy 10/05/2018   Volume overload 10/23/2017   Past Surgical History:  Procedure Laterality Date   A/V FISTULAGRAM N/A 01/01/2021   Procedure: A/V FISTULAGRAM;  Surgeon: Serafina Mitchell, MD;  Location: [place] [facility];  Service: Cardiovascular;  Laterality: N/A;   A/V FISTULAGRAM N/A 01/15/2021   Procedure: A/V [id number];  Surgeon: Serafina Mitchell, MD;  Location: [place] [facility];  Service: Cardiovascular;  Laterality: N/A;   ANTERIOR CERVICAL DECOMP/DISCECTOMY FUSION N/A 01/04/2020   Procedure: ANTERIOR CERVICAL DECOMPRESSION/DISCECTOMY FUSION CERVICAL  FIVE THROUGH SEVEN;  Surgeon: Melina Schools, MD;  Location: [place];  Service: Orthopedics;  Laterality: N/A;  3 hrs   AV FISTULA PLACEMENT Left 05/28/2017   Procedure: LEFT ARM ARTERIOVENOUS (AV) FISTULA CREATION;  Surgeon: Conrad [place], MD;  Location: [place];  Service: Vascular;   Laterality: Left;   AV FISTULA PLACEMENT Left 02/02/2021   Procedure: Debride left forearm, ligation of left upper arm Aretiovenous fistula;  Surgeon: Elam Dutch, MD;  Location: [place];  Service: Vascular;  Laterality: Left;   AV FISTULA PLACEMENT Right 04/04/2021   Procedure: RIGHT ARTERIOVENOUS (AV) FISTULA CREATION;  Surgeon: Serafina Mitchell, MD;  Location: [place] [facility];  Service: Vascular;  Laterality: Right;   BIOPSY  07/10/2021   Procedure: BIOPSY;  Surgeon: Lavena Bullion, DO;  Location: [place] [facility];  Service: Gastroenterology;;   COLONOSCOPY WITH PROPOFOL N/A 07/10/2021   Procedure: COLONOSCOPY WITH PROPOFOL;  Surgeon: Lavena Bullion, DO;  Location: [place];  Service: Gastroenterology;  Laterality: N/A;   ESOPHAGOGASTRODUODENOSCOPY (EGD) WITH PROPOFOL N/A 07/10/2021   Procedure: ESOPHAGOGASTRODUODENOSCOPY (EGD) WITH PROPOFOL;  Surgeon: Lavena Bullion, DO;  Location: [place];  Service: Gastroenterology;  Laterality: N/A;   EYE SURGERY Right 06/02/2019   Cataract removed   FRACTURE SURGERY     INSERTION OF DIALYSIS CATHETER Right 02/02/2021   Procedure: INSERTION OF Right Internal jugular TUNNELED  DIALYSIS CATHETER;  Surgeon: Elam Dutch, MD;  Location: [place];  Service: Vascular;  Laterality: Right;   IR DIALY SHUNT INTRO NEEDLE/INTRACATH INITIAL W/IMG LEFT Left 09/12/2020   IR FLUORO GUIDE CV LINE RIGHT  05/25/2017   IR FLUORO GUIDE CV LINE RIGHT  07/17/2021   IR US GUIDE VASC ACCESS LEFT  09/12/2020   IR US GUIDE VASC ACCESS RIGHT  05/25/2017   IR US GUIDE VASC ACCESS RIGHT  07/17/2021   LIGATION OF ARTERIOVENOUS  FISTULA Left 01/10/2021   Procedure: LIGATION OF LEFT ARM RADIOCEPHALIC FISTULA;  Surgeon: Serafina Mitchell, MD;  Location: [facility];  Service: Vascular;  Laterality: Left;   PERIPHERAL VASCULAR BALLOON ANGIOPLASTY Left 01/15/2021   Procedure: PERIPHERAL VASCULAR BALLOON ANGIOPLASTY;  Surgeon: Serafina Mitchell, MD;  Location: [place] [facility];  Service:  Cardiovascular;  Laterality: Left;  AVF   REVISON OF ARTERIOVENOUS FISTULA Left 07/18/2019   Procedure: REVISION PLICATION OF RADIOCEPHALIC ARTERIOVENOUS FISTULA LEFT ARM;  Surgeon: Angelia Mould, MD;  Location: [facility];  Service: Vascular;  Laterality: Left;   REVISON OF ARTERIOVENOUS FISTULA Left 01/10/2021   Procedure: CONVERSION TO BRACHIOCEPHALIC ARTERIOVENOUS FISTULA;  Surgeon: Serafina Mitchell, MD;  Location: [facility];  Service: Vascular;  Laterality: Left;   RINOPLASTY     Family History  Problem Relation Age of Onset   Cancer Mother    Social History:  reports that he has never smoked. He has never used smokeless tobacco. He reports that he does not currently use alcohol. He reports that he does not use drugs. Allergies  Allergen Reactions   Vancomycin Shortness Of Breath and Itching   Prior to Admission medications   Medication Sig Start Date End Date Taking? Authorizing Provider  multivitamin (RENA-VIT) TABS tablet Take 1 tablet by mouth at bedtime. 01/12/18  Yes [provider]  sevelamer carbonate (RENVELA) 800 MG tablet Take 800-1,600 mg by mouth See admin instructions. Take 1-2 tablets [phone] mg) by mouth three times daily with meals, take 1 tablet (800 mg) with snacks 05/31/21  Yes [provider]  albuterol (VENTOLIN HFA) 108 (  90 Base) MCG/ACT inhaler Inhale 2 puffs into the lungs 2 (two) times daily as needed for shortness of breath or wheezing. Patient not taking: Reported on 08/11/2021 06/01/21   Oswald Hillock, MD  amLODipine (NORVASC) 10 MG tablet Take 10 mg by mouth daily.    [provider]  apixaban (ELIQUIS) 2.5 MG TABS tablet Take 1 tablet (2.5 mg total) by mouth 2 (two) times daily. Patient not taking: Reported on 07/16/2021 07/13/21 08/12/21  Darliss Cheney, MD  cinacalcet (SENSIPAR) 60 MG tablet Take 1 tablet (60 mg total) by mouth every evening. Patient not taking: Reported on 07/16/2021 06/01/21   Oswald Hillock, MD  furosemide  (LASIX) 80 MG tablet Take 1 tablet (80 mg total) by mouth daily as needed for fluid (Use only on non-HD days (Tues/Thur/Sat/Sun)). Patient not taking: Reported on 08/11/2021 06/01/21   Oswald Hillock, MD  metoprolol tartrate (LOPRESSOR) 50 MG tablet Take 1 tablet (50 mg total) by mouth 2 (two) times daily. 06/01/21   Oswald Hillock, MD   Current Facility-Administered Medications  Medication Dose Route Frequency Provider Last Rate Last Admin   albumin human 25 % solution            apixaban (ELIQUIS) tablet 2.5 mg  2.5 mg Oral BID Rise Patience, MD   2.5 mg at 08/27/21 1242   Chlorhexidine Gluconate Cloth 2 % PADS 6 each  6 each Topical Q0600 Roney Jaffe, MD   6 each at 08/27/21 0545   feeding supplement (NEPRO CARB STEADY) liquid 237 mL  237 mL Oral BID BM Sharen Hones, MD   237 mL at 08/27/21 1334   metoprolol tartrate (LOPRESSOR) injection 5 mg  5 mg Intravenous Q6H PRN Sharen Hones, MD       metoprolol tartrate (LOPRESSOR) tablet 50 mg  50 mg Oral BID Rise Patience, MD   50 mg at 08/27/21 1242   multivitamin (RENA-VIT) tablet 1 tablet  1 tablet Oral QHS Rise Patience, MD   1 tablet at 08/26/21 2043   ondansetron [facility]) injection 4 mg  4 mg Intravenous Q6H PRN Etta Quill, DO   4 mg at 08/27/21 0025   oxyCODONE-acetaminophen (PERCOCET/ROXICET) 5-325 MG per tablet 1 tablet  1 tablet Oral Q4H PRN Sharen Hones, MD   1 tablet at 08/26/21 1343   sevelamer carbonate (RENVELA) tablet 1,600 mg  1,600 mg Oral TID WC Rise Patience, MD   1,600 mg at 08/27/21 1645   sevelamer carbonate (RENVELA) tablet 800 mg  800 mg Oral PRN Rise Patience, MD       Facility-Administered Medications Ordered in Other Encounters  Medication Dose Route Frequency Provider Last Rate Last Admin   EPINEPHrine (ADRENALIN) 1 MG/10ML injection   Intravenous Anesthesia Intra-op Helseth, Olivia C, CRNA   1 mg at 07/19/21 1018   fat emulsion 20 % bolus-local anes induced cardiac arrest -  20ml/kg over 1 min; MR q3 min for 3 doses total   Intravenous Anesthesia Intra-op Junie Bame B, CRNA   250 mL at 07/19/21 1030   flumazenil (ROMAZICON) injection   Intravenous Anesthesia Intra-op Nolon Nations, MD   0.5 mg at 07/19/21 1013   lidocaine-EPINEPHrine (XYLOCAINE W/EPI) 2 %-1:100000 (with pres) injection   Peri-NEURAL Anesthesia Intra-op Nolon Nations, MD   20 mL at 07/19/21 0934   naloxone (NARCAN) injection   Intravenous Anesthesia Intra-op Nolon Nations, MD   40 mcg at 07/19/21 1005   phenylephrine (NEO-SYNEPHRINE) 20mg /NS 242mL premix  infusion   Intravenous Continuous PRN Amadeo Garnet, CRNA   Stopped at 07/19/21 1150   propofol (DIPRIVAN) 500 MG/50ML infusion   Intravenous Continuous PRN Amadeo Garnet, CRNA   Stopped at 07/19/21 1129   Labs: Basic Metabolic Panel: Recent Labs  Lab 08/25/21 1930 08/26/21 1059 08/27/21 0832  NA 139 136 135  K 3.8 4.5 4.5  CL 98 96* 95*  CO2 26 20* 22  GLUCOSE 112* 62* 133*  BUN 42* 48* 64*  CREATININE 6.48*   6.37* 6.70* 8.27*  CALCIUM 10.1 9.8 9.7  PHOS  --   --  10.0*   Liver Function Tests: Recent Labs  Lab 08/25/21 1930 08/27/21 0832  AST 20  --   ALT 8  --   ALKPHOS 101  --   BILITOT 1.1  --   PROT 6.4*  --   ALBUMIN 2.6* 2.5*   No results for input(s): LIPASE, AMYLASE in the last 168 hours. No results for input(s): AMMONIA in the last 168 hours. CBC: Recent Labs  Lab 08/25/21 1930 08/26/21 1059 08/27/21 1231  WBC 5.1 4.8 3.9*  NEUTROABS 3.6  --   --   HGB 8.9* 9.7* 9.4*  HCT 28.2* 32.0* 29.0*  MCV 105.2* 108.8* 102.8*  PLT 194 200 206   Cardiac Enzymes: No results for input(s): CKTOTAL, CKMB, CKMBINDEX, TROPONINI in the last 168 hours. CBG: No results for input(s): GLUCAP in the last 168 hours. Iron Studies: No results for input(s): IRON, TIBC, TRANSFERRIN, FERRITIN in the last 72 hours. Studies/Results: DG Chest 1 View  Result Date: 08/25/2021 CLINICAL DATA:  Chest pain since last night,  end-stage renal disease on hemodialysis, nonproductive cough, CHF, COPD, hypertension, asthma EXAM: CHEST  1 VIEW COMPARISON:  Portable exam 1842 hours compared to 08/11/2021 FINDINGS: RIGHT jugular dual lumen central venous catheter with tip projecting over SVC. Enlargement of cardiac silhouette with pulmonary vascular congestion. Diffuse pulmonary infiltrates increased from previous exam consistent with pulmonary edema. Persistent RIGHT pleural effusion and basilar atelectasis. No pneumothorax. Atherosclerotic calcification aortic arch. Prior cervical spine fusion. IMPRESSION: Pulmonary edema consistent with CHF versus fluid overload, increased from previous exam. Persistent RIGHT pleural effusion and basilar atelectasis. Aortic Atherosclerosis (ICD10-I70.0). Electronically Signed   By: Lavonia Dana M.D.   On: 08/25/2021 18:49   CT ANGIO CHEST AORTA W/CM & OR WO/CM  Addendum Date: 08/26/2021   ADDENDUM REPORT: 08/26/2021 08:08 ADDENDUM: Study discussed by telephone with Dr. Roosevelt Locks on 08/26/2021 at 0802 hours. Electronically Signed   By: Genevie Ann M.D.   On: 08/26/2021 08:08   Result Date: 08/26/2021 CLINICAL DATA:  60 year old male with chest pain and shortness of breath. Acute aortic syndrome suspected. EXAM: CT ANGIOGRAPHY CHEST WITH CONTRAST TECHNIQUE: Multidetector CT imaging of the chest was performed using the standard protocol both prior to and during bolus administration of intravenous contrast. Multiplanar CT image reconstructions and MIPs were obtained to evaluate the vascular anatomy. CONTRAST:  71mL OMNIPAQUE IOHEXOL 350 MG/ML SOLN COMPARISON:  CTA chest 06/30/2021. FINDINGS: Cardiovascular: Study designed for aortic IV contrast timing but there is also good opacification of the bilateral pulmonary arteries. Superimposed respiratory motion. Small volume thrombus in right upper lobe pulmonary artery branches, primarily nonocclusive series 6 images 69 through 71. No other convincing focal filling  defect identified in the pulmonary arteries. Stable tortuosity and mild fusiform enlargement of the ascending aorta up to 42 mm diameter. Negative for aortic dissection or saccular aneurysm. Mild-to-moderate Calcified aortic atherosclerosis. Calcified coronary artery atherosclerosis and/or  stents. Stable cardiomegaly. No pericardial effusion. Mediastinum/Nodes: Negative. No mediastinal mass or lymphadenopathy. Lungs/Pleura: Moderate layering right pleural effusion has not significantly changed since October. There is confluent and enhancing compressive atelectasis in the dependent right upper lobe and right lower lobe. Smaller left pleural effusion with superimposed mild pleural thickening and calcification is stable. Stable associated left lower lobe atelectasis. Major airways remain patent. Diffuse superimposed centrilobular pulmonary ground-glass opacity in both lungs. Upper Abdomen: Negative visible liver, spleen, adrenal glands and bowel in the upper abdomen. Partially visible renal atrophy. Musculoskeletal: Previous lower cervical ACDF. Midthoracic flowing endplate osteophytes and ankylosis. Generalized increased bony sclerosis suggesting renal osteodystrophy. No acute or suspicious osseous lesion. Review of the MIP images confirms the above findings. IMPRESSION: 1. Positive for small and primarily nonocclusive right upper lobe pulmonary embolus (series 6, image 71), overall clinical significance doubtful. 2. Negative for acute aortic syndrome. Stable borderline aneurysmal Ascending thoracic aorta. Recommend annual imaging followup by CTA or MRA as detailed on October CTA. Aortic aneurysm NOS (ICD10-I71.9) Aortic Atherosclerosis (ICD10-I70.0). 3. Ongoing diffuse pulmonary ground-glass opacity suspicious for Acute Pulmonary Edema. Not significantly changed from October superimposed moderate layering right and small left pleural effusions with compressive atelectasis. 4. Partially visible renal atrophy.  Renal  osteodystrophy. Electronically Signed: By: Genevie Ann M.D. On: 08/26/2021 07:00    Physical Exam: Vitals:   08/27/21 1030 08/27/21 1100 08/27/21 1130 08/27/21 1135  BP: 109/69 (!) 109/59 114/65 115/73  Pulse: 70 78 82 81  Resp:    18  Temp:    97.6 F (36.4 C)  TempSrc:    Temporal  SpO2:    100%  Weight:    52 kg  Height:         General: WDWN NAD Lungs: Noted crackles bilateral bases; No wheeze or rhonchi. Breathing is unlabored. On O2 Heart: RRR. No murmur, rubs or gallops.  Abdomen: soft, nontender, +BS, no guarding, no rebound tenderness Lower extremities:no edema BLLE Neuro: AAOx3. Moves all extremities spontaneously. Dialysis Access: R IJ TDC; L AVF (unusable)  Dialysis Orders:  MWF - [place] 3.5h  400/500  52kg  2/2 bath  RIJ TDC/  L AVF unusable - mircera 150 ug q2 last 12/21 -venofer 100mg  IV weekly  Hgb 9.4, K 4.5, Ca 9.7, P 10.0, Alb 2.5  Assessment/Plan: Hypoxemic Respiratory Failure 2nd Volume Overload/Acute on Chronic CHF-plan for HD; last ECHO 08/12/21 EF 60-65%;  ESRD - on HD MWF; patient continuous non-compliance with HD-leaves txs early in outpatient. Received HD today-only tolerated net UF 1L d/t low Bps. Patient remained asymptomatic during today's HD. IV Albumin was given. Plan for sequential HD 08/28/21 in hopes to remove more fluid. Keep SBP >85. Closely monitoring of symptoms. Hypertension/volume  - Still hypervolemic but difficulty removing fluid d/t low Bps (please see above). If patient remains asymptomatic, keep SBP >85. Anemia of CKD - Hgb 9.4; ESA not due yet; monitor trends Secondary Hyperparathyroidism - PO4 high-continue binders Nutrition - Renal diet with fluid restriction GOC-Patient with multiple co-morbidities and ongoing non-compliance with HD. Palliative care consulted to further discuss [place] options.  Tobie Poet, NP [facility] 08/27/2021, 6:40 PM

## 2021-08-28 ENCOUNTER — Inpatient Hospital Stay (HOSPITAL_COMMUNITY): Payer: Medicare Other

## 2021-08-28 ENCOUNTER — Encounter (HOSPITAL_COMMUNITY): Payer: Self-pay | Admitting: Internal Medicine

## 2021-08-28 DIAGNOSIS — I480 Paroxysmal atrial fibrillation: Secondary | ICD-10-CM

## 2021-08-28 LAB — CBC
HCT: 26.1 % — ABNORMAL LOW (ref 39.0–52.0)
Hemoglobin: 8.1 g/dL — ABNORMAL LOW (ref 13.0–17.0)
MCH: 33.1 pg (ref 26.0–34.0)
MCHC: 31 g/dL (ref 30.0–36.0)
MCV: 106.5 fL — ABNORMAL HIGH (ref 80.0–100.0)
Platelets: 163 10*3/uL (ref 150–400)
RBC: 2.45 MIL/uL — ABNORMAL LOW (ref 4.22–5.81)
RDW: 16.5 % — ABNORMAL HIGH (ref 11.5–15.5)
WBC: 3.3 10*3/uL — ABNORMAL LOW (ref 4.0–10.5)
nRBC: 0.6 % — ABNORMAL HIGH (ref 0.0–0.2)

## 2021-08-28 LAB — RENAL FUNCTION PANEL
Albumin: 2.6 g/dL — ABNORMAL LOW (ref 3.5–5.0)
Anion gap: 14 (ref 5–15)
BUN: 26 mg/dL — ABNORMAL HIGH (ref 8–23)
CO2: 26 mmol/L (ref 22–32)
Calcium: 9.4 mg/dL (ref 8.9–10.3)
Chloride: 95 mmol/L — ABNORMAL LOW (ref 98–111)
Creatinine, Ser: 4.3 mg/dL — ABNORMAL HIGH (ref 0.61–1.24)
GFR, Estimated: 15 mL/min — ABNORMAL LOW (ref >60)
Glucose, Bld: 122 mg/dL — ABNORMAL HIGH (ref 70–99)
Phosphorus: 5.5 mg/dL — ABNORMAL HIGH (ref 2.5–4.6)
Potassium: 3 mmol/L — ABNORMAL LOW (ref 3.5–5.1)
Sodium: 135 mmol/L (ref 135–145)

## 2021-08-28 MED ORDER — AMIODARONE HCL 200 MG PO TABS: 200.0000 mg | ORAL_TABLET | Freq: Every day | ORAL | Status: DC

## 2021-08-28 MED ORDER — ALBUMIN HUMAN 25 % IV SOLN
INTRAVENOUS | Status: AC
  Filled 2021-08-28: qty 50

## 2021-08-28 MED ORDER — ALBUMIN HUMAN 25 % IV SOLN
12.5000 g | Freq: Once | INTRAVENOUS | Status: DC
  Filled 2021-08-28: qty 50

## 2021-08-28 MED ORDER — HEPARIN SODIUM (PORCINE) 1000 UNIT/ML IJ SOLN
INTRAMUSCULAR | Status: AC
  Filled 2021-08-28: qty 4

## 2021-08-28 NOTE — Procedures (Signed)
° °  I was present at this dialysis session, have reviewed the session itself and made  appropriate changes [name] MD [place] pager [phone]   08/27/2021, 9:11 AM

## 2021-08-28 NOTE — Consult Note (Addendum)
Cardiology Consultation:   Patient ID: Thomas Robinson MRN: [id number]; DOB: Mar 07, 1961  Admit date: 08/25/2021 Date of Consult: 08/28/2021  PCP:  [name], PA   [facility] Providers Cardiologist:  [name], DO        Patient Profile:   Thomas Robinson is a 60 y.o. male with a hx of normal coronary arteries by cath @ [facility] 08/2020, ESRD (frequent noncompliance with HD), COPD with chronic respiratory failure, sacral decubitus ulcers, GIB, anemia, chronic diastolic CHF with fluid management intended to be by dialysis, asthma, CVA, ascending aortic aneurysm (4.2cm by CT 08/2021), PEA arrest 07/2021, paroxysmal atrial flutter, peripheral neuropathy who is being seen 08/28/2021 for the evaluation of atrial fibrillation at the request of Dr. Sloan Leiter.  History of Present Illness:   The patient has followed up with Dr. Harriet Masson in the past for fluid overload in the context of ESRD unfortunately with patient noncompliance with hemodialysis or establishing with nephrology. He had prior issues with arguments with outside dialysis centers and previously had planned to try and obtain home dialysis equipment from a family member despite medical advisement for the contrary. He's had numerous admissions for various medical issues but namely respiratory/hypoxia in the context of skipped/incomplete HD. Multiple AMAs noted. He had an admission to OSH 08/2020 for chest pain in setting of missed HD x 2 weeks/hypoxia with elevated troponin and had cath with normal coronary arteries. His historical courses have also been complicated by atrial flutter (diagnosed 18/5631) complicated by hematochezia and GIB (07/2021), PEA arrest after sedation for AVF procedure (07/2021), septic shock, thrombocytopenia, hypotension requiring midodrine, and difficult to control HRs. We had seen also him for prior volume overload in the setting of cor pulomonale type of picture. Per Dr. Kathalene Frames consult note in 07/2021, the  patient was felt to be a poor candidate for any future invasive procedures due to inability to safely tolerate sedation without recurrent cardiac arrest. He has previously been sent home on hospice care although had revoked this decision on most recent admissions. He has often left without prescriptions due to leaving [place] before seen by a physician. He's not been a candidate for DCCV due to noncompliance with required anticoagulation. At last cardiology consultation 08/2021 it was felt that cardiology had little to offer and hospice was again recommended, with use of oral amiodarone to maintain NSR which was felt to be his only option. I do not see this was on his last discharge list. Last echo 08/12/2021 showed EF 60-65%, grade 1 Dd, mildly enlarged RV, severe pulm HTN with severe biatrial enlargement, small pericardial effusion, mild-moderate MR/TR, small ascending aortic aneurysm 4.2cm, LVOT obstruction felt due to compression from the RV on the LV, not HCM.  He presented back to the ED 08/25/21 with intermittent CP and acute on chronic respiratory failure requiring 4L O2. He is a difficult historian and has trouble quantifying chest pain, maybe a few minutes at a time, lasting for weeks. He is unable to say when or how this resolved. Hospital course notable for frequent documentation of belligerence and noncompliance with plan of care. During the interview he ripped off his finger pulse ox monitor and threw it across the bed. H&P referenced SOB but the patient denies this today. He had had dialysis on 12/23 but unfortunately had left early without completing his treatment. CT angio of the chest showed small and primarily nonocclusive right upper lobe pulmonary embolus (series 6, image 71), overall clinical significance doubtful, negative for acute aortic syndrome  with borderline aneurysmal ascending thoracic aorta (29mm), ongoing diffuse pulmonary ground-glass opacity suspicious for pulmonary edema, unchanged  moderate R/small L pleural effusions. He was in AF RVR initially placed on diltiazem but discontinued, then started on home metoprolol but also had to be stopped today due to soft BP. He was continued on Eliquis. He states he's been taking Eliquis and metoprolol at home but to pharm tech reported not taking Eliquis. I called [facility] who report he picked up metoprolol 06/01/21 (#60 with 1 refill) and Eliquis 07/06/21 (#60 with zero refills) so should technically be out of both of these. Labs notable for relatively flat tropnins ranging from 116 to 152 and remaining within this range x 5, hypokalemia (3.0), hypoalbuminemia, leukopenia, anemia with Hgb 8.1, elevated TSH 5.097, negative Covid/flu. IM consulted palliative care per note yesterday.   Cardiology asked to see for assistance with management of atrial fib given that beta blocker had to be stopped due to hypotension. His last dose of Lopressor 50mg  was last night. HR currently 80s. He reports overall feeling better without any further chest pain. He's required 2 consecutive days of HD thus far. States his breathing is better, denies SOB, but still feels somewhat "congested." No orthopnea or edema. He is unaware of any palpitations at this time.   Past Medical History:  Diagnosis Date   Anaphylactic shock, unspecified, sequela 06/10/2019   Aortic insufficiency    Aortic stenosis    Arthritis    Asthma, chronic, unspecified asthma severity, with acute exacerbation 10/23/2017   CAP (community acquired pneumonia) 10/23/2017   Carpal tunnel syndrome of right wrist 10/05/2018   Cataract    right - removed by surgery   Cerebral infarction due to thrombosis of cerebral artery (HCC)    Cervical disc herniation 01/04/2020   Chronic diastolic heart failure ([place]) 04/13/2018   Chronic low back pain 11/24/2019   Constipation    COPD (chronic obstructive pulmonary disease) (HCC)    Cough    chronic cough   Encounter for immunization 07/08/2017    ESRD on hemodialysis ([place]) 02/16/2018   Fall 06/04/2019   GERD (gastroesophageal reflux disease) 06/04/2019   GI bleeding 05/24/2017   Gram-negative sepsis, unspecified ([place]) 09/05/2017   History of fusion of cervical spine 03/26/2020   Hyperlipidemia    Hypertension    Hypokalemia 06/04/2017   Hypotension    Iron deficiency anemia, unspecified 09/09/2017   Left shoulder pain 11/11/2019   Lumbar radiculopathy 11/24/2019   Lung contusion 06/04/2019   LVH (left ventricular hypertrophy) due to hypertensive disease, with heart failure ([place]) 06/18/2020   Macrocytic anemia 05/24/2017   Mitral regurgitation    Moderate protein-calorie malnutrition ([place]) 06/06/2017   Myofascial pain syndrome 06/12/2020   Neuritis of right ulnar nerve 09/13/2018   Non-compliance with renal dialysis ([place]) 02/08/2020   Oxygen deficiency 12/30/2019   O2 sats on RA 87% at PAT appt    Pain in joint of right elbow 09/13/2018   Paroxysmal atrial flutter (HCC)    PEA (Pulseless electrical activity) ([place])    after sedation in 07/2021   Pulmonary hypertension ([place])    Renovascular hypertension 06/22/2020   Restless leg syndrome    Rib fractures 06/2019   Right   S/P cardiac cath 08/27/2020   normal coronary arteries.   Secondary hyperparathyroidism of renal origin ([place]) 06/04/2017   Thoracic ascending aortic aneurysm    Tricuspid regurgitation    Ulnar neuropathy 10/05/2018   Volume overload 10/23/2017    Past Surgical  History:  Procedure Laterality Date   A/V FISTULAGRAM N/A 01/01/2021   Procedure: A/V FISTULAGRAM;  Surgeon: Serafina Mitchell, MD;  Location: [facility];  Service: Cardiovascular;  Laterality: N/A;   A/V FISTULAGRAM N/A 01/15/2021   Procedure: A/V [id number];  Surgeon: Serafina Mitchell, MD;  Location: [name];  Service: Cardiovascular;  Laterality: N/A;   ANTERIOR CERVICAL DECOMP/DISCECTOMY FUSION N/A 01/04/2020   Procedure: ANTERIOR CERVICAL DECOMPRESSION/DISCECTOMY FUSION  CERVICAL FIVE THROUGH SEVEN;  Surgeon: Melina Schools, MD;  Location: [facility];  Service: Orthopedics;  Laterality: N/A;  3 hrs   AV FISTULA PLACEMENT Left 05/28/2017   Procedure: LEFT ARM ARTERIOVENOUS (AV) FISTULA CREATION;  Surgeon: Conrad Rushville, MD;  Location: [facility];  Service: Vascular;  Laterality: Left;   AV FISTULA PLACEMENT Left 02/02/2021   Procedure: Debride left forearm, ligation of left upper arm Aretiovenous fistula;  Surgeon: Elam Dutch, MD;  Location: [facility];  Service: Vascular;  Laterality: Left;   AV FISTULA PLACEMENT Right 04/04/2021   Procedure: RIGHT ARTERIOVENOUS (AV) FISTULA CREATION;  Surgeon: Serafina Mitchell, MD;  Location: [facility];  Service: Vascular;  Laterality: Right;   BIOPSY  07/10/2021   Procedure: BIOPSY;  Surgeon: Lavena Bullion, DO;  Location: [facility];  Service: Gastroenterology;;   COLONOSCOPY WITH PROPOFOL N/A 07/10/2021   Procedure: COLONOSCOPY WITH PROPOFOL;  Surgeon: Lavena Bullion, DO;  Location: [facility];  Service: Gastroenterology;  Laterality: N/A;   ESOPHAGOGASTRODUODENOSCOPY (EGD) WITH PROPOFOL N/A 07/10/2021   Procedure: ESOPHAGOGASTRODUODENOSCOPY (EGD) WITH PROPOFOL;  Surgeon: Lavena Bullion, DO;  Location: [facility];  Service: Gastroenterology;  Laterality: N/A;   EYE SURGERY Right 06/02/2019   Cataract removed   FRACTURE SURGERY     INSERTION OF DIALYSIS CATHETER Right 02/02/2021   Procedure: INSERTION OF Right Internal jugular TUNNELED  DIALYSIS CATHETER;  Surgeon: Elam Dutch, MD;  Location: [facility];  Service: Vascular;  Laterality: Right;   IR DIALY SHUNT INTRO NEEDLE/INTRACATH INITIAL W/IMG LEFT Left 09/12/2020   IR FLUORO GUIDE CV LINE RIGHT  05/25/2017   IR FLUORO GUIDE CV LINE RIGHT  07/17/2021   IR US GUIDE VASC ACCESS LEFT  09/12/2020   IR US GUIDE VASC ACCESS RIGHT  05/25/2017   IR US GUIDE VASC ACCESS RIGHT  07/17/2021   LIGATION OF ARTERIOVENOUS  FISTULA Left 01/10/2021   Procedure: LIGATION OF LEFT ARM  RADIOCEPHALIC FISTULA;  Surgeon: Serafina Mitchell, MD;  Location: [facility];  Service: Vascular;  Laterality: Left;   PERIPHERAL VASCULAR BALLOON ANGIOPLASTY Left 01/15/2021   Procedure: PERIPHERAL VASCULAR BALLOON ANGIOPLASTY;  Surgeon: Serafina Mitchell, MD;  Location: [facility];  Service: Cardiovascular;  Laterality: Left;  AVF   REVISON OF ARTERIOVENOUS FISTULA Left 07/18/2019   Procedure: REVISION PLICATION OF RADIOCEPHALIC ARTERIOVENOUS FISTULA LEFT ARM;  Surgeon: Angelia Mould, MD;  Location: [facility];  Service: Vascular;  Laterality: Left;   REVISON OF ARTERIOVENOUS FISTULA Left 01/10/2021   Procedure: CONVERSION TO BRACHIOCEPHALIC ARTERIOVENOUS FISTULA;  Surgeon: Serafina Mitchell, MD;  Location[facility];  Service: Vascular;  Laterality: Left;   RINOPLASTY       Home Medications:  Prior to Admission medications   Medication Sig Start Date End Date Taking? Authorizing Provider  multivitamin (RENA-VIT) TABS tablet Take 1 tablet by mouth at bedtime. 01/12/18  Yes [provider]  sevelamer carbonate (RENVELA) 800 MG tablet Take 800-1,600 mg by mouth See admin instructions. Take 1-2 tablets ([phone] mg) by mouth  three times daily with meals, take 1 tablet (800 mg) with snacks 05/31/21  Yes [provider]  albuterol (VENTOLIN HFA) 108 (90 Base) MCG/ACT inhaler Inhale 2 puffs into the lungs 2 (two) times daily as needed for shortness of breath or wheezing. Patient not taking: Reported on 08/11/2021 06/01/21   Oswald Hillock, MD  amLODipine (NORVASC) 10 MG tablet Take 10 mg by mouth daily.    [provider]  apixaban (ELIQUIS) 2.5 MG TABS tablet Take 1 tablet (2.5 mg total) by mouth 2 (two) times daily. Patient not taking: Reported on 07/16/2021 07/13/21 08/12/21  Darliss Cheney, MD  cinacalcet (SENSIPAR) 60 MG tablet Take 1 tablet (60 mg total) by mouth every evening. Patient not taking: Reported on 07/16/2021 06/01/21   Oswald Hillock, MD  furosemide (LASIX) 80  MG tablet Take 1 tablet (80 mg total) by mouth daily as needed for fluid (Use only on non-HD days (Tues/Thur/Sat/Sun)). Patient not taking: Reported on 08/11/2021 06/01/21   Oswald Hillock, MD  metoprolol tartrate (LOPRESSOR) 50 MG tablet Take 1 tablet (50 mg total) by mouth 2 (two) times daily. 06/01/21   Oswald Hillock, MD    Inpatient Medications: Scheduled Meds:  apixaban  2.5 mg Oral BID   Chlorhexidine Gluconate Cloth  6 each Topical Q0600   feeding supplement (NEPRO CARB STEADY)  237 mL Oral BID BM   multivitamin  1 tablet Oral QHS   sevelamer carbonate  1,600 mg Oral TID WC   Continuous Infusions:  albumin human     PRN Meds: guaiFENesin-dextromethorphan, metoprolol tartrate, ondansetron (ZOFRAN) IV, oxyCODONE-acetaminophen, sevelamer carbonate  Allergies:    Allergies  Allergen Reactions   Vancomycin Shortness Of Breath and Itching    Social History:   Social History   Socioeconomic History   Marital status: Single    Spouse name: Not on file   Number of children: Not on file   Years of education: Not on file   Highest education level: Not on file  Occupational History   Not on file  Tobacco Use   Smoking status: Never   Smokeless tobacco: Never  Vaping Use   Vaping Use: Never used  Substance and Sexual Activity   Alcohol use: Not Currently    Alcohol/week: 0.0 standard drinks    Comment: has a drink once in a while- 12/30/19   Drug use: No   Sexual activity: Not Currently  Other Topics Concern   Not on file  Social History Narrative   Lives with a roommate in a one story home.  Has 1 child.  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  Education: high school.   Social Determinants of Health   Financial Resource Strain: Not on file  Food Insecurity: Not on file  Transportation Needs: Not on file  Physical Activity: Not on file  Stress: Not on file  Social Connections: Not on file  Intimate Partner Violence: Not on file    Family History:    Family History   Problem Relation Age of Onset   Cancer Mother      ROS:  Please see the history of present illness.  All other ROS reviewed and negative.     Physical Exam/Data:   Vitals:   08/28/21 1030 08/28/21 1100 08/28/21 1116 08/28/21 1237  BP: (!) 92/56 (!) 92/53 94/61   Pulse:   89 82  Resp:   16   Temp:   (!) 97.4 F (36.3 C) 97.8 F (36.6  C)  TempSrc:   Temporal Oral  SpO2:   100% 100%  Weight:   50.9 kg   Height:        Intake/Output Summary (Last 24 hours) at 08/28/2021 1358 Last data filed at 08/28/2021 1330 Gross per 24 hour  Intake 550.13 ml  Output 1570 ml  Net -1019.87 ml   Last 3 Weights 08/28/2021 08/28/2021 08/28/2021  Weight (lbs) 112 lb 3.4 oz 116 lb 10 oz 114 lb 13.8 oz  Weight (kg) 50.9 kg 52.9 kg 52.1 kg     Body mass index is 17.06 kg/m.  General: Cachetic appearing WM in no acute distress. Chronically ill appearing. Head: Normocephalic, atraumatic, sclera non-icteric, no xanthomas, nares are without discharge. Neck: Negative for carotid bruits. JVP not elevated. Lungs: Diffusely diminished coarse BS throughout. Breathing is unlabored. Heart: Irregularly irregular, rate controlled, S1 S2 without murmurs, rubs, or gallops.  Abdomen: Soft, non-tender, non-distended with normoactive bowel sounds. No rebound/guarding. Extremities: No clubbing or cyanosis. No edema. Distal pedal pulses are 2+ and equal bilaterally. Neuro: Alert and oriented X 3. Moves all extremities spontaneously. Psych:  Responds to questions appropriately with a reserved affect.   EKG:  The EKG was personally reviewed and demonstrates:  most recently atrial fib 92bpm, rightward axis, prolonged QT interval, nonspecifc STT changes admit EKG appears similar except rate faster at 138bpm Telemetry:  Telemetry was personally reviewed and demonstrates:  atrial fib   Relevant CV Studies: 2D echo 08/12/21   1. LVOT outflow obstruction. Left ventricular ejection fraction, by  estimation, is 60  to 65%. The left ventricle has normal function. There is  severe left ventricular hypertrophy. Left ventricular diastolic parameters  are consistent with Grade I  diastolic dysfunction (impaired relaxation).   2. The right ventricular size is mildly enlarged. Mildly increased right  ventricular wall thickness. There is severely elevated pulmonary artery  systolic pressure.   3. Left atrial size was severely dilated.   4. Right atrial size was severely dilated.   5. A small pericardial effusion is present. There is no evidence of  cardiac tamponade.   6. No evidence of mitral valve regurgitation.   7. Tricuspid valve regurgitation is mild to moderate.   8. The aortic valve is calcified. Aortic valve regurgitation is mild to  moderate.   9. Aortic Small ascending aortic aneurysm 4.2 cm.  10. The inferior vena cava is dilated in size with <50% respiratory  variability, suggesting right atrial pressure of 15 mmHg.   Conclusion(s)/Recommendation(s): Compared to prior echo 07/19/2021, no  significant differences.   Laboratory Data:  High Sensitivity Troponin:   Recent Labs  Lab 08/25/21 1930 08/25/21 2125 08/27/21 1852 08/27/21 2006 08/27/21 2232  TROPONINIHS 147* 152* 123* 127* 116*     Chemistry Recent Labs  Lab 08/26/21 1059 08/27/21 0832 08/28/21 0830  NA 136 135 135  K 4.5 4.5 3.0*  CL 96* 95* 95*  CO2 20* 22 26  GLUCOSE 62* 133* 122*  BUN 48* 64* 26*  CREATININE 6.70* 8.27* 4.30*  CALCIUM 9.8 9.7 9.4  GFRNONAA 9* 7* 15*  ANIONGAP 20* 18* 14    Recent Labs  Lab 08/25/21 1930 08/27/21 0832 08/28/21 0830  PROT 6.4*  --   --   ALBUMIN 2.6* 2.5* 2.6*  AST 20  --   --   ALT 8  --   --   ALKPHOS 101  --   --   BILITOT 1.1  --   --    Lipids  No results for input(s): CHOL, TRIG, HDL, LABVLDL, LDLCALC, CHOLHDL in the last 168 hours.  Hematology Recent Labs  Lab 08/26/21 1059 08/27/21 1231 08/28/21 0830  WBC 4.8 3.9* 3.3*  RBC 2.94* 2.82* 2.45*  HGB 9.7*  9.4* 8.1*  HCT 32.0* 29.0* 26.1*  MCV 108.8* 102.8* 106.5*  MCH 33.0 33.3 33.1  MCHC 30.3 32.4 31.0  RDW 16.6* 16.3* 16.5*  PLT 200 206 163   Thyroid  Recent Labs  Lab 08/26/21 1059  TSH 5.097*    BNP Recent Labs  Lab 08/25/21 1930  BNP >4,500.0*    DDimer No results for input(s): DDIMER in the last 168 hours.   Radiology/Studies:  DG Chest 1 View  Result Date: 08/25/2021 CLINICAL DATA:  Chest pain since last night, end-stage renal disease on hemodialysis, nonproductive cough, CHF, COPD, hypertension, asthma EXAM: CHEST  1 VIEW COMPARISON:  Portable exam 1842 hours compared to 08/11/2021 FINDINGS: RIGHT jugular dual lumen central venous catheter with tip projecting over SVC. Enlargement of cardiac silhouette with pulmonary vascular congestion. Diffuse pulmonary infiltrates increased from previous exam consistent with pulmonary edema. Persistent RIGHT pleural effusion and basilar atelectasis. No pneumothorax. Atherosclerotic calcification aortic arch. Prior cervical spine fusion. IMPRESSION: Pulmonary edema consistent with CHF versus fluid overload, increased from previous exam. Persistent RIGHT pleural effusion and basilar atelectasis. Aortic Atherosclerosis (ICD10-I70.0). Electronically Signed   By: Lavonia Dana M.D.   On: 08/25/2021 18:49   CT ANGIO CHEST AORTA W/CM & OR WO/CM  Addendum Date: 08/26/2021   ADDENDUM REPORT: 08/26/2021 08:08 ADDENDUM: Study discussed by telephone with Dr. Roosevelt Locks on 08/26/2021 at 0802 hours. Electronically Signed   By: Genevie Ann M.D.   On: 08/26/2021 08:08   Result Date: 08/26/2021 CLINICAL DATA:  60 year old male with chest pain and shortness of breath. Acute aortic syndrome suspected. EXAM: CT ANGIOGRAPHY CHEST WITH CONTRAST TECHNIQUE: Multidetector CT imaging of the chest was performed using the standard protocol both prior to and during bolus administration of intravenous contrast. Multiplanar CT image reconstructions and MIPs were obtained to  evaluate the vascular anatomy. CONTRAST:  9mL OMNIPAQUE IOHEXOL 350 MG/ML SOLN COMPARISON:  CTA chest 06/30/2021. FINDINGS: Cardiovascular: Study designed for aortic IV contrast timing but there is also good opacification of the bilateral pulmonary arteries. Superimposed respiratory motion. Small volume thrombus in right upper lobe pulmonary artery branches, primarily nonocclusive series 6 images 69 through 71. No other convincing focal filling defect identified in the pulmonary arteries. Stable tortuosity and mild fusiform enlargement of the ascending aorta up to 42 mm diameter. Negative for aortic dissection or saccular aneurysm. Mild-to-moderate Calcified aortic atherosclerosis. Calcified coronary artery atherosclerosis and/or stents. Stable cardiomegaly. No pericardial effusion. Mediastinum/Nodes: Negative. No mediastinal mass or lymphadenopathy. Lungs/Pleura: Moderate layering right pleural effusion has not significantly changed since October. There is confluent and enhancing compressive atelectasis in the dependent right upper lobe and right lower lobe. Smaller left pleural effusion with superimposed mild pleural thickening and calcification is stable. Stable associated left lower lobe atelectasis. Major airways remain patent. Diffuse superimposed centrilobular pulmonary ground-glass opacity in both lungs. Upper Abdomen: Negative visible liver, spleen, adrenal glands and bowel in the upper abdomen. Partially visible renal atrophy. Musculoskeletal: Previous lower cervical ACDF. Midthoracic flowing endplate osteophytes and ankylosis. Generalized increased bony sclerosis suggesting renal osteodystrophy. No acute or suspicious osseous lesion. Review of the MIP images confirms the above findings. IMPRESSION: 1. Positive for small and primarily nonocclusive right upper lobe pulmonary embolus (series 6, image 71), overall clinical significance doubtful. 2. Negative for  acute aortic syndrome. Stable borderline  aneurysmal Ascending thoracic aorta. Recommend annual imaging followup by CTA or MRA as detailed on October CTA. Aortic aneurysm NOS (ICD10-I71.9) Aortic Atherosclerosis (ICD10-I70.0). 3. Ongoing diffuse pulmonary ground-glass opacity suspicious for Acute Pulmonary Edema. Not significantly changed from October superimposed moderate layering right and small left pleural effusions with compressive atelectasis. 4. Partially visible renal atrophy.  Renal osteodystrophy. Electronically Signed: By: Genevie Ann M.D. On: 08/26/2021 07:00     Assessment and Plan:   1. Paroxysmal atrial fib/flutter - historically difficult to manage in context of hypotension and nonadherence to medical therapy - not a candidate for DCCV due to noncompliance with anticoagulation, not felt to be able to tolerate more invasive procedures due to h/o cardiac arrest with sedation for unrelated procedure - HR currently acceptable off beta blocker therapy - can consider re-challenge with lower dose (I.e. metoprolol 12.5mg  BID) if BP improves - consider midodrine for hypotension as he previously was on this - reasonable to continue anticoagulation if no contraindication otherwise - follow anemia given h/o GIB - amiodarone considered but given his prolonged QT and currently normal heart rate, we will hold off   2. Chronic diastolic CHF with severe pulmonary HTN, LVOT obstruction - mainstay of therapy is compliance with HD, oxygen, medical management. Beyond this, no additional cardiac procedures would be likely to provide benefit  3. Chest pain with chronically elevated troponin - suspect due to incomplete HD - resolved - poor candidate for invasive management; also has h/o reassuring cath only 1 year ago - unlikely to benefit from additional ischemic workup at this time  4. Prolonged QT - recommend optimization of electrolytes (HD patient) and avoidance of QT prolonging agents (dc Zofran)  5. Mild aneurysmal ascending thoracic  aorta - can follow as OP  Risk Assessment/Risk Scores:     HEAR Score (for undifferentiated chest pain): Difficult to calculate as patient is vague historian  New York Heart Association (NYHA) Functional Class NYHA Class III  CHA2DS2-VASc Score = 5   This indicates a 7.2% annual risk of stroke. The patient's score is based upon: CHF History: 1 HTN History: 1 Diabetes History: 0 Stroke History: 2 Vascular Disease History: 1 Age Score: 0 Gender Score: 0         For questions or updates, please contact [facility] Please consult [email or web address] for contact info under    Signed, Charlie Pitter, PA-C  08/28/2021 1:58 PM  Attending Note:   The patient was seen and examined.  Agree with assessment and plan as noted above.  Changes made to the above note as needed.  Patient seen and independently examined with Melina Copa, PA .   We discussed all aspects of the encounter. I agree with the assessment and plan as stated above.    Atrial fib:   has had PAF . Paroxysmal atrial flutter.  Likely related to volume overload due to inconsistent dialysis.  He has continued to be noncompliant with his dialysis sessions, medications. It has been very challenging to keep him on any standard medication regimen.  Currently his HR is well controlled.   It is quite possible that his HR will be adequately controlled if he maintains consistent dialysis schedule    2.  Mental health concerns:   it is apparent that he has some underlying mental health concerns that are not yet well treated or perhaps not completely diagnosed.   This will present a challenge for him going forward.  This issue has  made it difficult / impossible for [place] to adequately treat this patient.  At this point, there is nothing more for cardiology to add He should follow up with Dr. Harriet Masson in the clinic.   [facility] will sign off.   Medication Recommendations:  consider metoprolol 12.5 mg BID if he needs additional rate  control .  Other recommendations (labs, testing, etc):   Follow up as an outpatient:  with Dr. Harriet Masson.         I have spent a total of 40 minutes with patient reviewing hospital  notes , telemetry, EKGs, labs and examining patient as well as establishing an assessment and plan that was discussed with the patient.  > 50% of time was spent in direct patient care.    Thayer Headings, Brooke Bonito., MD, [facility] 08/28/2021, 3:04 PM [address]  [place] Pager [phone]

## 2021-08-28 NOTE — Progress Notes (Signed)
Mobility Specialist Progress Note:   08/28/21 1556  Mobility  Activity Refused mobility   Pt stated he didn't want to ambulate right now.  [facility] Public librarian Phone [phone] Secondary Phone [phone]

## 2021-08-28 NOTE — Significant Event (Signed)
Pt is alert and oriented and demanding needs frequently.

## 2021-08-28 NOTE — Progress Notes (Signed)
PROGRESS NOTE    Thomas Robinson  OVZ:[id number] DOB: 1960/12/18 DOA: 08/25/2021 PCP: [name], PA    Brief Narrative:  60 year old with ESRD on hemodialysis Monday Wednesday Friday, diastolic heart failure, chronic A. fib, COPD and chronic hypoxemic respiratory failure on 2.5 L oxygen at home, severe noncompliance presented to the ER with increasing shortness of breath, missed hemodialysis.  In the emergency room hemodynamically stable.  A. fib with RVR.  Chest x-ray with volume overload and pleural effusions.  Assessment & Plan:   Principal Problem:   Fluid overload Active Problems:   ESRD on dialysis ([place])   Acute exacerbation of congestive heart failure (HCC)   Acute on chronic diastolic CHF (congestive heart failure) (HCC)   Decubitus ulcer of coccyx, stage 2 (HCC)   Atrial fibrillation with RVR (HCC)   Acute on chronic diastolic (congestive) heart failure (HCC)  Acute on chronic hypoxemic respiratory failure, oxygenation improved and now back to his home dose of oxygen. Fluid overload secondary to missed hemodialysis in a patient with ESRD: Recent echocardiogram with normal ejection fraction.  Profound fluid overload due to missed hemodialysis. Receiving consecutive dialysis, day 2 today.  Slight clinical improvement today.  Followed by nephrology.  Anticipating another dialysis tomorrow.  Start mobilizing with PT OT.  Chronic A. fib with RVR: Heart rate control remains a challenge.  He has intradialytic hypotension that is symptomatic.  Beta-blockers discontinued. Will discuss with cardiology about use of amiodarone for better rate control.  Therapeutic on Eliquis.  Subsegmental pulmonary embolism: Therapeutic on Eliquis.       DVT prophylaxis: apixaban (ELIQUIS) tablet 2.5 mg Start: 08/26/21 0315 apixaban (ELIQUIS) tablet 2.5 mg   Code Status: Full code Family Communication: None Disposition Plan: Status is: Inpatient  Remains inpatient appropriate because:  Hemodialysis need, poorly controlled heart rate         Consultants:  Cardiology Nephrology  Procedures:  Routine hemodialysis  Antimicrobials:  None   Subjective: Patient seen and examined.  He came back from hemodialysis.  Patient tells me that he lives by himself with friends. Patient was even unwilling to move himself up in the bed and was asking nurses to help him.  He has not mobilized yet.  Denies any chest pain.  He is willing to stay in the hospital for subsequent hemodialysis. Telemetry shows A. fib with heart rate variable mostly more than 100.  Objective: Vitals:   08/28/21 1030 08/28/21 1100 08/28/21 1116 08/28/21 1237  BP: (!) 92/56 (!) 92/53 94/61   Pulse:   89 82  Resp:   16   Temp:   (!) 97.4 F (36.3 C) 97.8 F (36.6 C)  TempSrc:   Temporal Oral  SpO2:   100% 100%  Weight:   50.9 kg   Height:        Intake/Output Summary (Last 24 hours) at 08/28/2021 1356 Last data filed at 08/28/2021 1330 Gross per 24 hour  Intake 550.13 ml  Output 1570 ml  Net -1019.87 ml   Filed Weights   08/28/21 0403 08/28/21 0805 08/28/21 1116  Weight: 52.1 kg 52.9 kg 50.9 kg    Examination:  General exam: Appears calm and comfortable  Chronically sick looking.  Debilitated.  Cachectic. Respiratory system: Decreased air entry at bases. Cardiovascular system: S1 & S2 heard, irregularly irregular.  Tachycardic.  No edema.   Gastrointestinal system: Soft.  Nontender.  Bowel sound present. Central nervous system: Alert and oriented. No focal neurological deficits. Extremities: AV fistula right upper extremity.  Thrill present.    Data Reviewed: I have personally reviewed following labs and imaging studies  CBC: Recent Labs  Lab 08/25/21 1930 08/26/21 1059 08/27/21 1231 08/28/21 0830  WBC 5.1 4.8 3.9* 3.3*  NEUTROABS 3.6  --   --   --   HGB 8.9* 9.7* 9.4* 8.1*  HCT 28.2* 32.0* 29.0* 26.1*  MCV 105.2* 108.8* 102.8* 106.5*  PLT 194 200 206 536   Basic  Metabolic Panel: Recent Labs  Lab 08/25/21 1930 08/26/21 1059 08/27/21 0832 08/28/21 0830  NA 139 136 135 135  K 3.8 4.5 4.5 3.0*  CL 98 96* 95* 95*  CO2 26 20* 22 26  GLUCOSE 112* 62* 133* 122*  BUN 42* 48* 64* 26*  CREATININE 6.48*   6.37* 6.70* 8.27* 4.30*  CALCIUM 10.1 9.8 9.7 9.4  PHOS  --   --  10.0* 5.5*   GFR: Estimated Creatinine Clearance: 12.8 mL/min (A) (by C-G formula based on SCr of 4.3 mg/dL (H)). Liver Function Tests: Recent Labs  Lab 08/25/21 1930 08/27/21 0832 08/28/21 0830  AST 20  --   --   ALT 8  --   --   ALKPHOS 101  --   --   BILITOT 1.1  --   --   PROT 6.4*  --   --   ALBUMIN 2.6* 2.5* 2.6*   No results for input(s): LIPASE, AMYLASE in the last 168 hours. No results for input(s): AMMONIA in the last 168 hours. Coagulation Profile: No results for input(s): INR, PROTIME in the last 168 hours. Cardiac Enzymes: No results for input(s): CKTOTAL, CKMB, CKMBINDEX, TROPONINI in the last 168 hours. BNP (last 3 results) No results for input(s): PROBNP in the last 8760 hours. HbA1C: No results for input(s): HGBA1C in the last 72 hours. CBG: No results for input(s): GLUCAP in the last 168 hours. Lipid Profile: No results for input(s): CHOL, HDL, LDLCALC, TRIG, CHOLHDL, LDLDIRECT in the last 72 hours. Thyroid Function Tests: Recent Labs    08/26/21 1059  TSH 5.097*   Anemia Panel: No results for input(s): VITAMINB12, FOLATE, FERRITIN, TIBC, IRON, RETICCTPCT in the last 72 hours. Sepsis Labs: No results for input(s): PROCALCITON, LATICACIDVEN in the last 168 hours.  Recent Results (from the past 240 hour(s))  Resp Panel by RT-PCR (Flu A&B, Covid) Nasopharyngeal Swab     Status: None   Collection Time: 08/25/21 10:50 PM   Specimen: Nasopharyngeal Swab; Nasopharyngeal(NP) swabs in vial transport medium  Result Value Ref Range Status   SARS Coronavirus 2 by RT PCR NEGATIVE NEGATIVE Final    Comment: (NOTE) SARS-CoV-2 target nucleic acids are NOT  DETECTED.  The SARS-CoV-2 RNA is generally detectable in upper respiratory specimens during the acute phase of infection. The lowest concentration of SARS-CoV-2 viral copies this assay can detect is 138 copies/mL. A negative result does not preclude SARS-Cov-2 infection and should not be used as the sole basis for treatment or other patient management decisions. A negative result may occur with  improper specimen collection/handling, submission of specimen other than nasopharyngeal swab, presence of viral mutation(s) within the areas targeted by this assay, and inadequate number of viral copies(<138 copies/mL). A negative result must be combined with clinical observations, patient history, and epidemiological information. The expected result is Negative.  Fact Sheet for Patients:  EntrepreneurPulse.com.au  Fact Sheet for Healthcare Providers:  IncredibleEmployment.be  This test is no t yet approved or cleared by the [place] FDA and  has been authorized for detection  and/or diagnosis of SARS-CoV-2 by FDA under an Emergency Use Authorization (EUA). This EUA will remain  in effect (meaning this test can be used) for the duration of the COVID-19 declaration under Section 564(b)(1) of the Act, 21 U.S.C.section 360bbb-3(b)(1), unless the authorization is terminated  or revoked sooner.       Influenza A by PCR NEGATIVE NEGATIVE Final   Influenza B by PCR NEGATIVE NEGATIVE Final    Comment: (NOTE) The Xpert Xpress SARS-CoV-2/FLU/RSV plus assay is intended as an aid in the diagnosis of influenza from Nasopharyngeal swab specimens and should not be used as a sole basis for treatment. Nasal washings and aspirates are unacceptable for Xpert Xpress SARS-CoV-2/FLU/RSV testing.  Fact Sheet for Patients: EntrepreneurPulse.com.au  Fact Sheet for Healthcare Providers: IncredibleEmployment.be  This test is not yet  approved or cleared by the [place] FDA and has been authorized for detection and/or diagnosis of SARS-CoV-2 by FDA under an Emergency Use Authorization (EUA). This EUA will remain in effect (meaning this test can be used) for the duration of the COVID-19 declaration under Section 564(b)(1) of the Act, 21 U.S.C. section 360bbb-3(b)(1), unless the authorization is terminated or revoked.  Performed at [facility], [place] [address], [place], [place] [address]          Radiology Studies: No results found.      Scheduled Meds:  apixaban  2.5 mg Oral BID   Chlorhexidine Gluconate Cloth  6 each Topical Q0600   feeding supplement (NEPRO CARB STEADY)  237 mL Oral BID BM   multivitamin  1 tablet Oral QHS   sevelamer carbonate  1,600 mg Oral TID WC   Continuous Infusions:  albumin human       LOS: 2 days    Time spent: 30 minutes    Barb Merino, MD [facility] Pager [phone]

## 2021-08-28 NOTE — Evaluation (Signed)
Physical Therapy Evaluation Patient Details Name: Thomas Robinson MRN: [id number] DOB: 1961-08-28 Today's Date: 08/28/2021  History of Present Illness  Pt is a 60 y.o. male admitted 08/25/21 with SOB. Workup for afib with RVR, CHF exacerbation, pleural effusion. PMH includes ESRD (HD MWF), CHF, afib, TAA, COPD, asthma; of note, recent admission 08/11/21 with chest pain, sacral wound.   Clinical Impression  Pt presents with an overall decrease in functional mobility secondary to above. PTA, pt limited household ambulator with rollator, lives with roommate who assists with ADL/iADLs, wears 2.5L O2 baseline. Today, pt tolerating brief bout of ambulation with rollator and min guard for balance, endorses feeling weak. Pt reports roommate able to continue providing necessary assist upon return home; pt not interested in post-acute rehab at [facility]. Pt would benefit from continued acute PT services to maximize functional mobility and independence prior to d/c with HHPT services.      Recommendations for follow up therapy are one component of a multi-disciplinary discharge planning process, led by the attending physician.  Recommendations may be updated based on patient status, additional functional criteria and insurance authorization.  Follow Up Recommendations Home health PT    Assistance Recommended at Discharge Frequent or constant Supervision/Assistance  Functional Status Assessment Patient has had a recent decline in their functional status and demonstrates the ability to make significant improvements in function in a reasonable and predictable amount of time.  Equipment Recommendations  None recommended by PT    Recommendations for Other Services       Precautions / Restrictions Precautions Precautions: Fall;Other (comment) Precaution Comments: Wears 2.5L O2 baseline Restrictions Weight Bearing Restrictions: No      Mobility  Bed Mobility Overal bed mobility: Modified Independent Bed  Mobility: Supine to Sit     Supine to sit: Modified independent (Device/Increase time);HOB elevated     General bed mobility comments: Heavy use of bed rail    Transfers Overall transfer level: Needs assistance Equipment used: Rollator (4 wheels) Transfers: Sit to/from Stand Sit to Stand: Min guard           General transfer comment: cues to lock rollator brakes prior to standing, min guard for balance; poor eccentric control to sitting    Ambulation/Gait Ambulation/Gait assistance: Min guard Gait Distance (Feet): 38 Feet Assistive device: Rollator (4 wheels) Gait Pattern/deviations: Step-through pattern;Decreased stride length;Trunk flexed Gait velocity: Decreased     General Gait Details: Slow, fatigued gait with rollator and min guard for balance; pt declines further distance secondary to fatigue  Stairs            Wheelchair Mobility    Modified Rankin (Stroke Patients Only)       Balance Overall balance assessment: Needs assistance Sitting-balance support: No upper extremity supported Sitting balance-Leahy Scale: Good     Standing balance support: No upper extremity supported;During functional activity;Reliant on assistive device for balance Standing balance-Leahy Scale: Poor                               Pertinent Vitals/Pain Pain Assessment: Faces Faces Pain Scale: Hurts a little bit Pain Location: back Pain Descriptors / Indicators: Discomfort Pain Intervention(s): Monitored during session;Repositioned    Home Living Family/patient expects to be discharged to:: Private residence Living Arrangements: Non-relatives/Friends Available Help at Discharge: Friend(s);Available 24 hours/day Type of Home: House Home Access: Stairs to enter Entrance Stairs-Rails: None Entrance Stairs-Number of Steps: 2   Home Layout: One level Home  Equipment: Rollator (4 wheels);Cane - single point;Shower seat;Wheelchair - manual Additional Comments:  pt reports good friends live with him and provides as much assist as needed    Prior Function Prior Level of Function : Needs assist       Physical Assist : Mobility (physical);ADLs (physical)     Mobility Comments: Limited ambulator with rollator; reports he does drive, but friend does majority of driving ADLs Comments: Reports getting into shower has become too difficult, so has recently been doing bird baths sitting at sink in kitchen with assist from friend/roommate. Roommate does majority of cooking and household tasks     Journalist, newspaper        Extremity/Trunk Assessment   Upper Extremity Assessment Upper Extremity Assessment: Generalized weakness (reports L hand/digits swelling is baseline from prior injury)    Lower Extremity Assessment Lower Extremity Assessment: Generalized weakness       Communication   Communication: No difficulties  Cognition Arousal/Alertness: Awake/alert Behavior During Therapy: WFL for tasks assessed/performed Overall Cognitive Status: Within Functional Limits for tasks assessed                                 General Comments: Pleasant and agreeable; WFL for simple tasks, not formally assessed        General Comments General comments (skin integrity, edema, etc.): SpO2 100% on 3L at rest, HR 86. Difficulty getting reliable pulse ox reading with mobility. Pt reports no concern with return home in current physical condition, reports roommate able to assist with all needs; pt not interested in post-acute rehab, but is agreeable to HHPT for strengthening    Exercises     Assessment/Plan    PT Assessment Patient needs continued PT services  PT Problem List Decreased strength;Decreased balance;Decreased mobility;Decreased activity tolerance;Decreased knowledge of use of DME;Decreased knowledge of precautions       PT Treatment Interventions DME instruction;Functional mobility training;Balance training;Patient/family  education;Therapeutic activities;Gait training;Therapeutic exercise    PT Goals (Current goals can be found in the Care Plan section)  Acute Rehab PT Goals Patient Stated Goal: return home with help from roommate/friend PT Goal Formulation: With patient Time For Goal Achievement: 09/11/21 Potential to Achieve Goals: Good    Frequency Min 3X/week   Barriers to discharge        Co-evaluation               AM-PAC PT "6 Clicks" Mobility  Outcome Measure Help needed turning from your back to your side while in a flat bed without using bedrails?: None Help needed moving from lying on your back to sitting on the side of a flat bed without using bedrails?: A Little Help needed moving to and from a bed to a chair (including a wheelchair)?: A Little Help needed standing up from a chair using your arms (e.g., wheelchair or bedside chair)?: A Little Help needed to walk in hospital room?: A Little Help needed climbing 3-5 steps with a railing? : A Lot 6 Click Score: 18    End of Session Equipment Utilized During Treatment: Gait belt;Oxygen Activity Tolerance: Patient tolerated treatment well;Patient limited by fatigue Patient left: in chair;with call bell/phone within reach Nurse Communication: Mobility status PT Visit Diagnosis: Muscle weakness (generalized) (M62.81);Other abnormalities of gait and mobility (R26.89)    Time: 1749-4496 PT Time Calculation (min) (ACUTE ONLY): 21 min   Charges:   PT Evaluation $PT Eval Moderate Complexity: 1 Mod  Mabeline Caras, PT, DPT Acute Rehabilitation Services  Pager [phone] Office [place] 08/28/2021, 3:32 PM

## 2021-08-28 NOTE — Progress Notes (Signed)
Palliative:  Consult received and chart reviewed - patient going down to radiology when I entered room. Will f/u 12/29.  Juel Burrow, DNP, AGNP-C Palliative Medicine Team Team Phone # [phone]  Pager # [phone]  NO CHARGE

## 2021-08-28 NOTE — Progress Notes (Signed)
[facility] Progress Note  Subjective: seen in HD, no SOB today, BP's soft, 1.5 L UF today and 1 L yesterday.   Vitals:   08/28/21 1000 08/28/21 1030 08/28/21 1100 08/28/21 1116  BP: (!) 88/54 (!) 92/56 (!) 92/53 94/61  Pulse:    89  Resp:    16  Temp:    (!) 97.4 F (36.3 C)  TempSrc:    Temporal  SpO2:    100%  Weight:    50.9 kg  Height:        Exam:  alert, nad   no jvd  Chest cta bilat, no rales  Cor reg no RG  Abd soft ntnd no ascites   Ext no LE edema   Alert, NF, ox3   RIJ TDC / LUA AVF (unusable)     OP HD: MWF SW  3.5h  400/500  52kg  2/2 bath  RIJ TDC  - mircera 150 ug q2 last 12/21  -venofer 100mg  IV weekly     Assessment/Plan: Acute/ chronic hypoxia resp failure - d/t volume overload most likely. Last EF 60%. Pulm edema by initial CXR. Trying to get vol down w/ HD but hypotension is a problem. Get f/u CXR today.  ESRD - on HD MWF. Signs off early. HD today on schedule.  Hypotension - poor candidate for CRRT due to sig FTT. Will need to dc metoprolol, will d/w pmd. Atrial fib - on BB but BP's too low, will have to dc metoprolol Anemia of CKD - Hgb 9.4; ESA not due yet; monitor trends Secondary Hyperparathyroidism - PO4 high-continue binders Nutrition - Renal diet with fluid restriction GOC- pt w/ FTT    Thomas Robinson 08/28/2021, 12:00 PM   Recent Labs  Lab 08/27/21 0832 08/27/21 1231 08/28/21 0830  K 4.5  --  3.0*  BUN 64*  --  26*  CREATININE 8.27*  --  4.30*  CALCIUM 9.7  --  9.4  PHOS 10.0*  --  5.5*  HGB  --  9.4* 8.1*   Inpatient medications:  apixaban  2.5 mg Oral BID   Chlorhexidine Gluconate Cloth  6 each Topical Q0600   feeding supplement (NEPRO CARB STEADY)  237 mL Oral BID BM   metoprolol tartrate  50 mg Oral BID   multivitamin  1 tablet Oral QHS   sevelamer carbonate  1,600 mg Oral TID WC    albumin human     guaiFENesin-dextromethorphan, metoprolol tartrate, ondansetron (ZOFRAN) IV,  oxyCODONE-acetaminophen, sevelamer carbonate

## 2021-08-29 NOTE — Progress Notes (Signed)
Patient refused IV placement citing imminent discharge. Informed patient that discharge is uncertain and dependent on physician's interpretation of labs, assessment, etc. Patient continued to refuse IV placement. On call MD notified. Receiving AM RN notified.

## 2021-08-29 NOTE — Care Management Important Message (Signed)
Important Message  Patient Details  Name: Thomas Robinson MRN: [id number] Date of Birth: 10/19/1960   Medicare Important Message Given:  Yes     Porchia Sinkler [address] 08/29/2021, 3:39 PM

## 2021-08-29 NOTE — Care Management Important Message (Signed)
Important Message  Patient Details  Name: Thomas Robinson MRN: [id number] Date of Birth: 20-Jun-1961   Medicare Important Message Given:  Yes     Mieke Brinley [address] 08/29/2021, 3:40 PM

## 2021-08-29 NOTE — Progress Notes (Signed)
[facility] Progress Note  Subjective: seen in room, f/u CXR better, loculated R effusion no change. Down to 50.5kg at lowest here. BP's low normal today. Cardiology saw and nothing to offer. Resume metoprolol 12.5 bid when needed.   Vitals:   08/28/21 1237 08/28/21 1628 08/28/21 1954 08/29/21 0814  BP:   110/68 119/74  Pulse: 82  88 86  Resp:   18 20  Temp: 97.8 F (36.6 C) 98.6 F (37 C) 98.5 F (36.9 C) 98.5 F (36.9 C)  TempSrc: Oral Oral Oral Oral  SpO2: 100%  97% 92%  Weight:      Height:        Exam:  alert, nad   no jvd  Chest cta bilat, no rales, dec'd R base  Cor reg no RG  Abd soft ntnd no ascites   Ext no LE edema   Alert, NF, ox3   RIJ TDC / LUA AVF (unusable)     OP HD: MWF SW  3.5h  400/500  52kg  2/2 bath  RIJ TDC  - mircera 150 ug q2 last 12/21  -venofer 100mg  IV weekly     Assessment/Plan: Acute/ chronic hypoxia resp failure - d/t volume overload most likely. Last EF 60%. Pulm edema by initial CXR.  Hypotension is a problem. Had HD x 2 here w/ lower wt 53 > 50.5kg and SOB is better , CXR w/ resolved edema back to usual R loc effusion. OK for dc from renal standpoint. Lower dry wt to 50.5kg. Pt has home O2.  ESRD - on HD MWF. As above, plan is for dc.  Hypotension - poor candidate for CRRT due to sig FTT.  Atrial fib - on BB but BP's too low. May need to resume metoprolol 12.5 bid per cards recommendations, esp if he reverts to atrial fib.  Anemia of CKD - Hgb 9.4; ESA not due yet; monitor trends Secondary Hyperparathyroidism - PO4 high-continue binders Nutrition - Renal diet with fluid restriction Dispo - stable for d/c from renal standpoint    Thomas Robinson Thomas Robinson 08/29/2021, 1:51 PM   Recent Labs  Lab 08/27/21 0832 08/27/21 1231 08/28/21 0830  K 4.5  --  3.0*  BUN 64*  --  26*  CREATININE 8.27*  --  4.30*  CALCIUM 9.7  --  9.4  PHOS 10.0*  --  5.5*  HGB  --  9.4* 8.1*    Inpatient medications:  apixaban  2.5 mg Oral BID    Chlorhexidine Gluconate Cloth  6 each Topical Q0600   feeding supplement (NEPRO CARB STEADY)  237 mL Oral BID BM   multivitamin  1 tablet Oral QHS   sevelamer carbonate  1,600 mg Oral TID WC    albumin human     guaiFENesin-dextromethorphan, metoprolol tartrate, oxyCODONE-acetaminophen, sevelamer carbonate

## 2021-08-29 NOTE — Progress Notes (Signed)
Palliative:  Noted that patient is being discharged today per his request. Patient declines recommended medications and treatment plan recommendations. Declined follow up appointments and discharge instructions with RN. Declined to speak with TOC saying he "doesn't have time to fool with that".  He plans to go to outpatient HD tomorrow. Will not pursue goals of care discussion today. Please note that [place] discussion was had in November this year with palliative medicine team and completed MOST form is listed in [place].   Juel Burrow, DNP, AGNP-C Palliative Medicine Team Team Phone # [phone]  Pager # [phone]  NO CHARGE

## 2021-08-29 NOTE — Progress Notes (Addendum)
OT Cancellation Note  Patient Details Name: Thomas Robinson MRN: [id number] DOB: 1960-12-20   Cancelled Treatment:    Reason Eval/Treat Not Completed: Other (comment) Pt currently having a heated discussion on phone regarding a bill. Will follow-up for OT eval again as schedule permits.  Addendum: re-attempt OT eval at 10AM with pt adamantly refusing any OOB activity due to preference to sleep. Became agitated when OT asked if there was a certain time that was better to check back with pt. If schedule permits, may check back this afternoon vs tomorrow, 12/30.  Layla Maw 08/29/2021, 8:53 AM

## 2021-08-29 NOTE — Discharge Summary (Signed)
Physician Discharge Summary  Thomas Robinson VEH:[id number] DOB: 10-09-1960 DOA: 08/25/2021  PCP: [name], PA  Admit date: 08/25/2021 Discharge date: 08/29/2021  Admitted From: Home Disposition: Home  Recommendations for Outpatient Follow-up:  Go to dialysis tomorrow  Home Health: N/A Equipment/Devices: Oxygen 2.5 L/min available at home  Discharge Condition: Fair CODE STATUS: Full code Diet recommendation: Regular diet.  Nutrition supplements.  Discharge summary: 60 year old with ESRD on hemodialysis Monday Wednesday Friday, diastolic heart failure, chronic A. fib, COPD and chronic hypoxemic respiratory failure on 2.5 L oxygen at home, severe noncompliance presented to the ER with increasing shortness of breath, missed hemodialysis.  In the emergency room hemodynamically stable.  A. fib with RVR.  Chest x-ray with volume overload and pleural effusions.   Acute on chronic hypoxemic respiratory failure, oxygenation improved and now back to his home dose of oxygen. Fluid overload secondary to missed hemodialysis in a patient with ESRD: Recent echocardiogram with normal ejection fraction.  Profound fluid overload due to missed hemodialysis. Received hemodialysis 12/27, 12/28.  Next dialysis tomorrow outpatient.   Much clinical improvement.  He mostly declines the mobility and medical suggestions.   Chronic A. fib with RVR: Heart rate control is challenging due to intradialytic hypotension.  Discontinuing beta-blockers.  He has A. fib but asymptomatic.  Denies any chest pain or palpitations with tachycardia.  Seen by cardiology.  They recommend midodrine with dialysis as needed. Recommend low-dose beta-blockers 12.5 mg twice a day if persistent tachycardic.  Unable to use amiodarone because of QT prolongation. Therapeutic on Eliquis.   Subsegmental pulmonary embolism: Nonocclusive thrombus right upper lobe.  Therapeutic on Eliquis.  He is on low-dose of Eliquis, however he is  very noncompliant.  We will keep him on similar doses.   Patient is noncompliant to medications.  Does not respect treatment plans and decisions.  He also does what he wants and does not want to mobilize. Is medically stable.  He requested to be discharged.  He will go to outpatient dialysis tomorrow.  Discharge Diagnoses:  Principal Problem:   Fluid overload Active Problems:   ESRD on dialysis [facility])   Acute exacerbation of congestive heart failure (HCC)   Acute on chronic diastolic CHF (congestive heart failure) (HCC)   Decubitus ulcer of coccyx, stage 2 (HCC)   Atrial fibrillation with RVR (HCC)   Acute on chronic diastolic (congestive) heart failure [facility])    Discharge Instructions  Discharge Instructions     Diet - low sodium heart healthy   Complete by: As directed    Increase activity slowly   Complete by: As directed    No wound care   Complete by: As directed       Allergies as of 08/29/2021       Reactions   Vancomycin Shortness Of Breath, Itching        Medication List     STOP taking these medications    amLODipine 10 MG tablet Commonly known as: NORVASC   furosemide 80 MG tablet Commonly known as: LASIX       TAKE these medications    albuterol 108 (90 Base) MCG/ACT inhaler Commonly known as: VENTOLIN HFA Inhale 2 puffs into the lungs 2 (two) times daily as needed for shortness of breath or wheezing.   apixaban 2.5 MG Tabs tablet Commonly known as: ELIQUIS Take 1 tablet (2.5 mg total) by mouth 2 (two) times daily.   cinacalcet 60 MG tablet Commonly known as: SENSIPAR Take 1 tablet (60 mg total)  by mouth every evening.   metoprolol tartrate 50 MG tablet Commonly known as: LOPRESSOR Take 1 tablet (50 mg total) by mouth 2 (two) times daily.   multivitamin Tabs tablet Take 1 tablet by mouth at bedtime.   sevelamer carbonate 800 MG tablet Commonly known as: RENVELA Take 800-1,600 mg by mouth See admin instructions. Take 1-2 tablets  ([phone] mg) by mouth three times daily with meals, take 1 tablet (800 mg) with snacks        Allergies  Allergen Reactions   Vancomycin Shortness Of Breath and Itching    Consultations: Nephrology Cardiology   Procedures/Studies: DG Chest 1 View  Result Date: 08/25/2021 CLINICAL DATA:  Chest pain since last night, end-stage renal disease on hemodialysis, nonproductive cough, CHF, COPD, hypertension, asthma EXAM: CHEST  1 VIEW COMPARISON:  Portable exam 1842 hours compared to 08/11/2021 FINDINGS: RIGHT jugular dual lumen central venous catheter with tip projecting over SVC. Enlargement of cardiac silhouette with pulmonary vascular congestion. Diffuse pulmonary infiltrates increased from previous exam consistent with pulmonary edema. Persistent RIGHT pleural effusion and basilar atelectasis. No pneumothorax. Atherosclerotic calcification aortic arch. Prior cervical spine fusion. IMPRESSION: Pulmonary edema consistent with CHF versus fluid overload, increased from previous exam. Persistent RIGHT pleural effusion and basilar atelectasis. Aortic Atherosclerosis (ICD10-I70.0). Electronically Signed   By: Lavonia Dana M.D.   On: 08/25/2021 18:49   DG Chest 2 View  Result Date: 08/28/2021 CLINICAL DATA:  SOB EXAM: CHEST - 2 VIEW COMPARISON:  08/25/2021 FINDINGS: Right chest wall dialysis catheter is again noted with tip projecting over the distal SVC. Stable cardiomediastinal contours. Small right pleural effusion is unchanged. Atelectasis and or airspace disease within the right lower lobe is unchanged. Diffuse hazy lung opacities are again seen compatible with pulmonary edema. IMPRESSION: Persistent right pleural effusion with overlying atelectasis/airspace disease. Unchanged mild diffuse edema. Electronically Signed   By: Kerby Moors M.D.   On: 08/28/2021 18:13   DG Chest Port 1 View  Result Date: 08/11/2021 CLINICAL DATA:  Initial evaluation for acute chest pain, shortness of breath,  weakness. End-stage renal disease on hemodialysis. EXAM: PORTABLE CHEST 1 VIEW COMPARISON:  Prior radiograph from 07/19/2021. FINDINGS: Right-sided hemodialysis catheter in place with tip overlying the cavoatrial junction. Moderate cardiomegaly, stable. Mediastinal silhouette within normal limits. Aortic atherosclerosis. Lungs normally inflated. Diffuse vascular and interstitial prominence compatible with moderate diffuse pulmonary interstitial edema. Right greater than left pleural effusions. Associated right basilar opacity likely reflects atelectasis and/or edema. No pneumothorax. No acute osseous finding.  Cervical ACDF noted. IMPRESSION: 1. Cardiomegaly with moderate diffuse pulmonary interstitial edema and right greater than left pleural effusions, suggesting CHF. 2. Associated right basilar opacity, likely atelectasis and/or edema. Electronically Signed   By: Jeannine Boga M.D.   On: 08/11/2021 20:09   CT ANGIO CHEST AORTA W/CM & OR WO/CM  Addendum Date: 08/26/2021   ADDENDUM REPORT: 08/26/2021 08:08 ADDENDUM: Study discussed by telephone with Dr. Roosevelt Locks on 08/26/2021 at 0802 hours. Electronically Signed   By: Genevie Ann M.D.   On: 08/26/2021 08:08   Result Date: 08/26/2021 CLINICAL DATA:  60 year old male with chest pain and shortness of breath. Acute aortic syndrome suspected. EXAM: CT ANGIOGRAPHY CHEST WITH CONTRAST TECHNIQUE: Multidetector CT imaging of the chest was performed using the standard protocol both prior to and during bolus administration of intravenous contrast. Multiplanar CT image reconstructions and MIPs were obtained to evaluate the vascular anatomy. CONTRAST:  73mL OMNIPAQUE IOHEXOL 350 MG/ML SOLN COMPARISON:  CTA chest 06/30/2021. FINDINGS: Cardiovascular: Study  designed for aortic IV contrast timing but there is also good opacification of the bilateral pulmonary arteries. Superimposed respiratory motion. Small volume thrombus in right upper lobe pulmonary artery branches,  primarily nonocclusive series 6 images 69 through 71. No other convincing focal filling defect identified in the pulmonary arteries. Stable tortuosity and mild fusiform enlargement of the ascending aorta up to 42 mm diameter. Negative for aortic dissection or saccular aneurysm. Mild-to-moderate Calcified aortic atherosclerosis. Calcified coronary artery atherosclerosis and/or stents. Stable cardiomegaly. No pericardial effusion. Mediastinum/Nodes: Negative. No mediastinal mass or lymphadenopathy. Lungs/Pleura: Moderate layering right pleural effusion has not significantly changed since October. There is confluent and enhancing compressive atelectasis in the dependent right upper lobe and right lower lobe. Smaller left pleural effusion with superimposed mild pleural thickening and calcification is stable. Stable associated left lower lobe atelectasis. Major airways remain patent. Diffuse superimposed centrilobular pulmonary ground-glass opacity in both lungs. Upper Abdomen: Negative visible liver, spleen, adrenal glands and bowel in the upper abdomen. Partially visible renal atrophy. Musculoskeletal: Previous lower cervical ACDF. Midthoracic flowing endplate osteophytes and ankylosis. Generalized increased bony sclerosis suggesting renal osteodystrophy. No acute or suspicious osseous lesion. Review of the MIP images confirms the above findings. IMPRESSION: 1. Positive for small and primarily nonocclusive right upper lobe pulmonary embolus (series 6, image 71), overall clinical significance doubtful. 2. Negative for acute aortic syndrome. Stable borderline aneurysmal Ascending thoracic aorta. Recommend annual imaging followup by CTA or MRA as detailed on October CTA. Aortic aneurysm NOS (ICD10-I71.9) Aortic Atherosclerosis (ICD10-I70.0). 3. Ongoing diffuse pulmonary ground-glass opacity suspicious for Acute Pulmonary Edema. Not significantly changed from October superimposed moderate layering right and small left  pleural effusions with compressive atelectasis. 4. Partially visible renal atrophy.  Renal osteodystrophy. Electronically Signed: By: Genevie Ann M.D. On: 08/26/2021 07:00   ECHOCARDIOGRAM COMPLETE  Result Date: 08/12/2021    ECHOCARDIOGRAM LIMITED REPORT   Patient Name:   JARONE OSTERGAARD Date of Exam: 08/12/2021 Medical Rec #:  [id number]        Height:       68.0 in Accession #:    [id number]       Weight:       128.0 lb Date of Birth:  Jan 08, 1961        BSA:          1.691 m Patient Age:    49 years         BP:           162/91 mmHg Patient Gender: M                HR:           84 bpm. Exam Location:  Inpatient Procedure: 2D Echo, 3D Echo, Cardiac Doppler and Color Doppler Indications:    R07.9* Chest pain, unspecified  History:        Patient has prior history of Echocardiogram examinations. CHF,                 Abnormal ECG, Stroke, Arrythmias:Atrial Flutter,                 Signs/Symptoms:Chest Pain, Dyspnea and Shortness of Breath; Risk                 Factors:Hypertension and Dyslipidemia. Hypoxia.  Sonographer:    Roseanna Rainbow RDCS Referring Phys: Esmond  1. LVOT outflow obstruction. Left ventricular ejection fraction, by estimation, is 60 to 65%. The left ventricle has normal function. There is severe  left ventricular hypertrophy. Left ventricular diastolic parameters are consistent with Grade I diastolic dysfunction (impaired relaxation).  2. The right ventricular size is mildly enlarged. Mildly increased right ventricular wall thickness. There is severely elevated pulmonary artery systolic pressure.  3. Left atrial size was severely dilated.  4. Right atrial size was severely dilated.  5. A small pericardial effusion is present. There is no evidence of cardiac tamponade.  6. No evidence of mitral valve regurgitation.  7. Tricuspid valve regurgitation is mild to moderate.  8. The aortic valve is calcified. Aortic valve regurgitation is mild to moderate.  9. Aortic Small  ascending aortic aneurysm 4.2 cm. 10. The inferior vena cava is dilated in size with <50% respiratory variability, suggesting right atrial pressure of 15 mmHg. Conclusion(s)/Recommendation(s): Compared to prior echo 07/19/2021, no significant differences. FINDINGS  Left Ventricle: LVOT outflow obstruction. Left ventricular ejection fraction, by estimation, is 60 to 65%. The left ventricle has normal function. There is severe left ventricular hypertrophy. Left ventricular diastolic parameters are consistent with Grade I diastolic dysfunction (impaired relaxation). Right Ventricle: The right ventricular size is mildly enlarged. Mildly increased right ventricular wall thickness. There is severely elevated pulmonary artery systolic pressure. The tricuspid regurgitant velocity is 3.77 m/s, and with an assumed right atrial pressure of 15 mmHg, the estimated right ventricular systolic pressure is 44.0 mmHg. Left Atrium: Left atrial size was severely dilated. Right Atrium: Right atrial size was severely dilated. Pericardium: A small pericardial effusion is present. There is no evidence of cardiac tamponade. Mitral Valve: There is severe calcification of the mitral valve leaflet(s). MV peak gradient, 13.0 mmHg. The mean mitral valve gradient is 5.0 mmHg. Tricuspid Valve: Tricuspid valve regurgitation is mild to moderate. Aortic Valve: The aortic valve is calcified. Aortic valve regurgitation is mild to moderate. Aortic regurgitation PHT measures 327 msec. Aortic valve mean gradient measures 15.8 mmHg. Aortic valve peak gradient measures 30.2 mmHg. Aortic valve area, by VTI measures 1.79 cm. Pulmonic Valve: Pulmonic valve regurgitation is mild. Aorta: Small ascending aortic aneurysm 4.2 cm. Venous: The inferior vena cava is dilated in size with less than 50% respiratory variability, suggesting right atrial pressure of 15 mmHg. IAS/Shunts: No atrial level shunt detected by color flow Doppler. LEFT VENTRICLE PLAX 2D LVIDd:          3.80 cm     Diastology LVIDs:         2.40 cm     LV e' medial:    5.66 cm/s LV PW:         1.20 cm     LV E/e' medial:  20.1 LV IVS:        1.60 cm     LV e' lateral:   5.55 cm/s LVOT diam:     1.90 cm     LV E/e' lateral: 20.5 LV SV:         87 LV SV Index:   51 LVOT Area:     2.84 cm                             3D Volume EF: LV Volumes (MOD)           3D EF:        71 % LV vol d, MOD A2C: 77.9 ml LV EDV:       96 ml LV vol d, MOD A4C: 62.0 ml LV ESV:       28 ml LV  vol s, MOD A2C: 22.3 ml LV SV:        68 ml LV vol s, MOD A4C: 16.4 ml LV SV MOD A2C:     55.6 ml LV SV MOD A4C:     62.0 ml LV SV MOD BP:      53.1 ml RIGHT VENTRICLE             IVC RV S prime:     13.90 cm/s  IVC diam: 2.55 cm TAPSE (M-mode): 1.4 cm LEFT ATRIUM              Index        RIGHT ATRIUM           Index LA diam:        3.60 cm  2.13 cm/m   RA Area:     23.90 cm LA Vol (A2C):   106.0 ml 62.70 ml/m  RA Volume:   81.00 ml  47.91 ml/m LA Vol (A4C):   85.0 ml  50.28 ml/m LA Biplane Vol: 105.0 ml 62.11 ml/m  AORTIC VALVE AV Area (Vmax):    1.67 cm AV Area (Vmean):   1.48 cm AV Area (VTI):     1.79 cm AV Vmax:           274.75 cm/s AV Vmean:          182.500 cm/s AV VTI:            0.485 m AV Peak Grad:      30.2 mmHg AV Mean Grad:      15.8 mmHg LVOT Vmax:         162.00 cm/s LVOT Vmean:        95.200 cm/s LVOT VTI:          0.307 m LVOT/AV VTI ratio: 0.63 AI PHT:            327 msec  AORTA Ao Root diam: 3.40 cm Ao Asc diam:  4.20 cm MITRAL VALVE                TRICUSPID VALVE MV Area (PHT): 3.08 cm     TR Peak grad:   56.9 mmHg MV Area VTI:   1.81 cm     TR Vmax:        377.00 cm/s MV Peak grad:  13.0 mmHg MV Mean grad:  5.0 mmHg     SHUNTS MV Vmax:       1.80 m/s     Systemic VTI:  0.31 m MV Vmean:      105.0 cm/s   Systemic Diam: 1.90 cm MV Decel Time: 246 msec MV E velocity: 114.00 cm/s MV A velocity: 164.00 cm/s MV E/A ratio:  0.70 Landscape architect signed by Phineas Inches Signature Date/Time: 08/12/2021/5:05:32 PM     Final    VAS [place] LOWER EXTREMITY VENOUS (DVT)  Result Date: 08/12/2021  Lower Venous DVT Study Patient Name:  MAXXWELL EDGETT  Date of Exam:   08/12/2021 Medical Rec #: [id number]         Accession #:    [id number] Date of Birth: [birth year]         Patient Gender: M Patient Age:   67 years Exam Location:  [facility] Procedure:      VAS [place] LOWER EXTREMITY VENOUS (DVT) Referring Phys: Nyoka Lint DOUTOVA --------------------------------------------------------------------------------  Indications: Edema.  Risk Factors: CHF. Comparison Study: 07-01-2021 Most recent prior lower extremity venous was  negative for DVT. Performing Technologist: Darlin Coco RDMS, RVT  Examination Guidelines: A complete evaluation includes B-mode imaging, spectral Doppler, color Doppler, and power Doppler as needed of all accessible portions of each vessel. Bilateral testing is considered an integral part of a complete examination. Limited examinations for reoccurring indications may be performed as noted. The reflux portion of the exam is performed with the patient in reverse Trendelenburg.  +---------+---------------+---------+-----------+----------+--------------+  RIGHT     Compressibility Phasicity Spontaneity Properties Thrombus Aging  +---------+---------------+---------+-----------+----------+--------------+  CFV       Full            No        Yes                                    +---------+---------------+---------+-----------+----------+--------------+  SFJ       Full                                                             +---------+---------------+---------+-----------+----------+--------------+  FV Prox   Full                                                             +---------+---------------+---------+-----------+----------+--------------+  FV Mid    Full                                                              +---------+---------------+---------+-----------+----------+--------------+  FV Distal Full                                                             +---------+---------------+---------+-----------+----------+--------------+  PFV       Full                                                             +---------+---------------+---------+-----------+----------+--------------+  POP       Full            No        Yes                                    +---------+---------------+---------+-----------+----------+--------------+  PTV       Full                                                             +---------+---------------+---------+-----------+----------+--------------+  PERO      Full                                                             +---------+---------------+---------+-----------+----------+--------------+  Gastroc   Full                                                             +---------+---------------+---------+-----------+----------+--------------+   +---------+---------------+---------+-----------+----------+--------------+  LEFT      Compressibility Phasicity Spontaneity Properties Thrombus Aging  +---------+---------------+---------+-----------+----------+--------------+  CFV       Full            No        Yes                                    +---------+---------------+---------+-----------+----------+--------------+  SFJ       Full                                                             +---------+---------------+---------+-----------+----------+--------------+  FV Prox   Full                                                             +---------+---------------+---------+-----------+----------+--------------+  FV Mid    Full                                                             +---------+---------------+---------+-----------+----------+--------------+  FV Distal Full                                                              +---------+---------------+---------+-----------+----------+--------------+  PFV       Full                                                             +---------+---------------+---------+-----------+----------+--------------+  POP       Full            No        Yes                                    +---------+---------------+---------+-----------+----------+--------------+  PTV       Full                                                             +---------+---------------+---------+-----------+----------+--------------+  PERO      Full                                                             +---------+---------------+---------+-----------+----------+--------------+  Gastroc   Full                                                             +---------+---------------+---------+-----------+----------+--------------+     Summary: RIGHT: - There is no evidence of deep vein thrombosis in the lower extremity.  - No cystic structure found in the popliteal fossa.  LEFT: - There is no evidence of deep vein thrombosis in the lower extremity.  - No cystic structure found in the popliteal fossa.  *See table(s) above for measurements and observations. Electronically signed by Servando Snare MD on 08/12/2021 at 4:19:34 PM.    Final    (Echo, Carotid, EGD, Colonoscopy, ERCP)    Subjective: Patient seen and examined.  Feels fine.  Not very interested to talk.   Discharge Exam: Vitals:   08/28/21 1954 08/29/21 0814  BP: 110/68 119/74  Pulse: 88 86  Resp: 18 20  Temp: 98.5 F (36.9 C) 98.5 F (36.9 C)  SpO2: 97% 92%   Vitals:   08/28/21 1237 08/28/21 1628 08/28/21 1954 08/29/21 0814  BP:   110/68 119/74  Pulse: 82  88 86  Resp:   18 20  Temp: 97.8 F (36.6 C) 98.6 F (37 C) 98.5 F (36.9 C) 98.5 F (36.9 C)  TempSrc: Oral Oral Oral Oral  SpO2: 100%  97% 92%  Weight:      Height:        General: Pt is alert, awake, not in acute distress On room air at rest.  Uses 2 L oxygen on mobility.   Cachectic.  Debilitated. Cardiovascular: RRR, S1/S2 +, no rubs, no gallops Respiratory: CTA bilaterally, no wheezing, no rhonchi Abdominal: Soft, NT, ND, bowel sounds + Extremities: AV fistula right upper extremity.    The results of significant diagnostics from this hospitalization (including imaging, microbiology, ancillary and laboratory) are listed below for reference.     Microbiology: Recent Results (from the past 240 hour(s))  Resp Panel by RT-PCR (Flu A&B, Covid) Nasopharyngeal Swab     Status: None   Collection Time: 08/25/21 10:50 PM   Specimen: Nasopharyngeal Swab; Nasopharyngeal(NP) swabs in vial transport medium  Result Value Ref Range Status   SARS Coronavirus 2 by RT PCR NEGATIVE NEGATIVE Final    Comment: (NOTE) SARS-CoV-2 target nucleic acids are NOT DETECTED.  The SARS-CoV-2 RNA is generally detectable in upper respiratory specimens during the acute phase of infection. The lowest concentration of SARS-CoV-2 viral copies this assay can detect is 138  copies/mL. A negative result does not preclude SARS-Cov-2 infection and should not be used as the sole basis for treatment or other patient management decisions. A negative result may occur with  improper specimen collection/handling, submission of specimen other than nasopharyngeal swab, presence of viral mutation(s) within the areas targeted by this assay, and inadequate number of viral copies(<138 copies/mL). A negative result must be combined with clinical observations, patient history, and epidemiological information. The expected result is Negative.  Fact Sheet for Patients:  EntrepreneurPulse.com.au  Fact Sheet for Healthcare Providers:  IncredibleEmployment.be  This test is no t yet approved or cleared by the [place] FDA and  has been authorized for detection and/or diagnosis of SARS-CoV-2 by FDA under an Emergency Use Authorization (EUA). This EUA will remain  in  effect (meaning this test can be used) for the duration of the COVID-19 declaration under Section 564(b)(1) of the Act, 21 U.S.C.section 360bbb-3(b)(1), unless the authorization is terminated  or revoked sooner.       Influenza A by PCR NEGATIVE NEGATIVE Final   Influenza B by PCR NEGATIVE NEGATIVE Final    Comment: (NOTE) The Xpert Xpress SARS-CoV-2/FLU/RSV plus assay is intended as an aid in the diagnosis of influenza from Nasopharyngeal swab specimens and should not be used as a sole basis for treatment. Nasal washings and aspirates are unacceptable for Xpert Xpress SARS-CoV-2/FLU/RSV testing.  Fact Sheet for Patients: EntrepreneurPulse.com.au  Fact Sheet for Healthcare Providers: IncredibleEmployment.be  This test is not yet approved or cleared by the [place] FDA and has been authorized for detection and/or diagnosis of SARS-CoV-2 by FDA under an Emergency Use Authorization (EUA). This EUA will remain in effect (meaning this test can be used) for the duration of the COVID-19 declaration under Section 564(b)(1) of the Act, 21 U.S.C. section 360bbb-3(b)(1), unless the authorization is terminated or revoked.  Performed at [facility], [place] [address] [place], [place] [address]      Labs: BNP (last 3 results) Recent Labs    07/08/21 0608 08/12/21 0153 08/25/21 1930  BNP >4,500.0* >4,500.0* >4,562.5*   Basic Metabolic Panel: Recent Labs  Lab 08/25/21 1930 08/26/21 1059 08/27/21 0832 08/28/21 0830  NA 139 136 135 135  K 3.8 4.5 4.5 3.0*  CL 98 96* 95* 95*  CO2 26 20* 22 26  GLUCOSE 112* 62* 133* 122*  BUN 42* 48* 64* 26*  CREATININE 6.48*   6.37* 6.70* 8.27* 4.30*  CALCIUM 10.1 9.8 9.7 9.4  PHOS  --   --  10.0* 5.5*   Liver Function Tests: Recent Labs  Lab 08/25/21 1930 08/27/21 0832 08/28/21 0830  AST 20  --   --   ALT 8  --   --   ALKPHOS 101  --   --   BILITOT 1.1  --   --   PROT 6.4*  --   --    ALBUMIN 2.6* 2.5* 2.6*   No results for input(s): LIPASE, AMYLASE in the last 168 hours. No results for input(s): AMMONIA in the last 168 hours. CBC: Recent Labs  Lab 08/25/21 1930 08/26/21 1059 08/27/21 1231 08/28/21 0830  WBC 5.1 4.8 3.9* 3.3*  NEUTROABS 3.6  --   --   --   HGB 8.9* 9.7* 9.4* 8.1*  HCT 28.2* 32.0* 29.0* 26.1*  MCV 105.2* 108.8* 102.8* 106.5*  PLT 194 200 206 163   Cardiac Enzymes: No results for input(s): CKTOTAL, CKMB, CKMBINDEX, TROPONINI in the last 168 hours. BNP: Invalid input(s): POCBNP  CBG: No results for input(s): GLUCAP in the last 168 hours. D-Dimer No results for input(s): DDIMER in the last 72 hours. Hgb A1c No results for input(s): HGBA1C in the last 72 hours. Lipid Profile No results for input(s): CHOL, HDL, LDLCALC, TRIG, CHOLHDL, LDLDIRECT in the last 72 hours. Thyroid function studies No results for input(s): TSH, T4TOTAL, T3FREE, THYROIDAB in the last 72 hours.  Invalid input(s): FREET3 Anemia work up No results for input(s): VITAMINB12, FOLATE, FERRITIN, TIBC, IRON, RETICCTPCT in the last 72 hours. Urinalysis    Component Value Date/Time   COLORURINE YELLOW 05/24/2017 1729   APPEARANCEUR HAZY (A) 05/24/2017 1729   LABSPEC 1.011 05/24/2017 1729   PHURINE 5.0 05/24/2017 1729   GLUCOSEU 50 (A) 05/24/2017 1729   HGBUR SMALL (A) 05/24/2017 1729   BILIRUBINUR NEGATIVE 05/24/2017 1729   KETONESUR NEGATIVE 05/24/2017 1729   PROTEINUR 100 (A) 05/24/2017 1729   UROBILINOGEN 0.2 04/09/2015 1527   NITRITE NEGATIVE 05/24/2017 1729   LEUKOCYTESUR TRACE (A) 05/24/2017 1729   Sepsis Labs Invalid input(s): PROCALCITONIN,  WBC,  LACTICIDVEN Microbiology Recent Results (from the past 240 hour(s))  Resp Panel by RT-PCR (Flu A&B, Covid) Nasopharyngeal Swab     Status: None   Collection Time: 08/25/21 10:50 PM   Specimen: Nasopharyngeal Swab; Nasopharyngeal(NP) swabs in vial transport medium  Result Value Ref Range Status   SARS  Coronavirus 2 by RT PCR NEGATIVE NEGATIVE Final    Comment: (NOTE) SARS-CoV-2 target nucleic acids are NOT DETECTED.  The SARS-CoV-2 RNA is generally detectable in upper respiratory specimens during the acute phase of infection. The lowest concentration of SARS-CoV-2 viral copies this assay can detect is 138 copies/mL. A negative result does not preclude SARS-Cov-2 infection and should not be used as the sole basis for treatment or other patient management decisions. A negative result may occur with  improper specimen collection/handling, submission of specimen other than nasopharyngeal swab, presence of viral mutation(s) within the areas targeted by this assay, and inadequate number of viral copies(<138 copies/mL). A negative result must be combined with clinical observations, patient history, and epidemiological information. The expected result is Negative.  Fact Sheet for Patients:  EntrepreneurPulse.com.au  Fact Sheet for Healthcare Providers:  IncredibleEmployment.be  This test is no t yet approved or cleared by the [place] FDA and  has been authorized for detection and/or diagnosis of SARS-CoV-2 by FDA under an Emergency Use Authorization (EUA). This EUA will remain  in effect (meaning this test can be used) for the duration of the COVID-19 declaration under Section 564(b)(1) of the Act, 21 U.S.C.section 360bbb-3(b)(1), unless the authorization is terminated  or revoked sooner.       Influenza A by PCR NEGATIVE NEGATIVE Final   Influenza B by PCR NEGATIVE NEGATIVE Final    Comment: (NOTE) The Xpert Xpress SARS-CoV-2/FLU/RSV plus assay is intended as an aid in the diagnosis of influenza from Nasopharyngeal swab specimens and should not be used as a sole basis for treatment. Nasal washings and aspirates are unacceptable for Xpert Xpress SARS-CoV-2/FLU/RSV testing.  Fact Sheet for  Patients: EntrepreneurPulse.com.au  Fact Sheet for Healthcare Providers: IncredibleEmployment.be  This test is not yet approved or cleared by the [place] FDA and has been authorized for detection and/or diagnosis of SARS-CoV-2 by FDA under an Emergency Use Authorization (EUA). This EUA will remain in effect (meaning this test can be used) for the duration of the COVID-19 declaration under Section 564(b)(1) of the Act, 21 U.S.C. section 360bbb-3(b)(1), unless the authorization is  terminated or revoked.  Performed at [facility], [place] [address], [place], [place] [address]      Time coordinating discharge:  32 minutes  SIGNED:   Barb Merino, MD  [facility] 08/29/2021, 1:36 PM

## 2021-08-29 NOTE — TOC Progression Note (Signed)
Transition of Care [facility]) - Progression Note    Patient Details  Name: Thomas Robinson MRN: [id number] Date of Birth: [birth year]  Transition of Care [facility]) CM/SW [place], RN Phone Number:[phone]  08/29/2021, 2:56 PM  Clinical Narrative:    CM at bedside to discuss [facility] with patient. Patient says he "does not want to discuss he doesn't have time to fool with that." TOC unable to set up [facility] services        Expected Discharge Plan and Services           Expected Discharge Date: 08/29/21                                     Social Determinants of Health (SDOH) Interventions    Readmission Risk Interventions Readmission Risk Prevention Plan 01/15/2021 09/11/2020  Transportation Screening Complete Complete  Medication Review (RN Care Manager) Complete -  PCP or Specialist appointment within 3-5 days of discharge - Not Complete  PCP/Specialist Appt Not Complete comments - first available appt is 09/26/20  [place] or Home Care Consult Complete (No Data)  SW Recovery Care/Counseling Consult Complete Complete  Palliative Care Screening Not Applicable Not [place] Not Applicable Not Applicable  Some recent data might be hidden

## 2021-08-29 NOTE — Progress Notes (Signed)
PROGRESS NOTE    Thomas Robinson  FWY:[id number] DOB: 01/28/1961 DOA: 08/25/2021 PCP: [name], PA    Brief Narrative:  60 year old with ESRD on hemodialysis Monday Wednesday Friday, diastolic heart failure, chronic A. fib, COPD and chronic hypoxemic respiratory failure on 2.5 L oxygen at home, severe noncompliance presented to the ER with increasing shortness of breath, missed hemodialysis.  In the emergency room hemodynamically stable.  A. fib with RVR.  Chest x-ray with volume overload and pleural effusions.  Assessment & Plan:   Principal Problem:   Fluid overload Active Problems:   ESRD on dialysis ([place])   Acute exacerbation of congestive heart failure (HCC)   Acute on chronic diastolic CHF (congestive heart failure) (HCC)   Decubitus ulcer of coccyx, stage 2 (HCC)   Atrial fibrillation with RVR (HCC)   Acute on chronic diastolic (congestive) heart failure (HCC)  Acute on chronic hypoxemic respiratory failure, oxygenation improved and now back to his home dose of oxygen. Fluid overload secondary to missed hemodialysis in a patient with ESRD: Recent echocardiogram with normal ejection fraction.  Profound fluid overload due to missed hemodialysis. Received hemodialysis 12/27, 12/28.  Next dialysis as per nephrology.  Slight clinical improvement today.  Followed by nephrology.  Anticipating another dialysis tomorrow.  Start mobilizing with PT OT.  He mostly declines the mobility.  Chronic A. fib with RVR: Heart rate control remains challenge.  He has intradialytic hypotension that is symptomatic.  Beta-blockers discontinued. Seen by cardiology.  They recommend midodrine with dialysis as needed. Recommend low-dose beta-blockers 12.5 mg twice a day.  Unable to use amiodarone because of QT prolongation. Therapeutic on Eliquis.  Subsegmental pulmonary embolism: Nonocclusive thrombus right upper lobe.  Therapeutic on Eliquis.  He is on low-dose of Eliquis, however he is very  noncompliant.  We will keep him on similar doses.  Patient is noncompliant to medications.  Does not respect treatment plans and decisions.  He also does what he wants and does not want to mobilize.   DVT prophylaxis: apixaban (ELIQUIS) tablet 2.5 mg Start: 08/26/21 0315 apixaban (ELIQUIS) tablet 2.5 mg   Code Status: Full code Family Communication: None Disposition Plan: Status is: Inpatient  Remains inpatient appropriate because: Hemodialysis need, poorly controlled heart rate         Consultants:  Cardiology Nephrology  Procedures:  Routine hemodialysis  Antimicrobials:  None   Subjective:  Patient seen and examined.  Went to examine him, he will not keep his phone down. Went back to talk to him again, he feels sleepy and is not interested to talk to me. He is waiting for his nephrologist to tell him whether they will give him dialysis or not.  Blood pressure stable.  Heart rate is irregular with A. fib but fairly stable without any rate control medications.  Objective: Vitals:   08/28/21 1237 08/28/21 1628 08/28/21 1954 08/29/21 0814  BP:   110/68 119/74  Pulse: 82  88 86  Resp:   18 20  Temp: 97.8 F (36.6 C) 98.6 F (37 C) 98.5 F (36.9 C) 98.5 F (36.9 C)  TempSrc: Oral Oral Oral Oral  SpO2: 100%  97% 92%  Weight:      Height:        Intake/Output Summary (Last 24 hours) at 08/29/2021 1320 Last data filed at 08/29/2021 1000 Gross per 24 hour  Intake 1188 ml  Output 0 ml  Net 1188 ml   Filed Weights   08/28/21 0403 08/28/21 0805 08/28/21 1116  Weight: 52.1 kg 52.9 kg 50.9 kg    Examination:  General exam: Appears calm and comfortable  Chronically sick looking.  Debilitated.  Cachectic. Respiratory system: Decreased air entry at bases. Cardiovascular system: S1 & S2 heard, irregularly irregular. No edema.   Gastrointestinal system: Soft.  Nontender.  Bowel sound present. Central nervous system: Alert and oriented. No focal neurological  deficits. Extremities: AV fistula right upper extremity.  Thrill present.    Data Reviewed: I have personally reviewed following labs and imaging studies  CBC: Recent Labs  Lab 08/25/21 1930 08/26/21 1059 08/27/21 1231 08/28/21 0830  WBC 5.1 4.8 3.9* 3.3*  NEUTROABS 3.6  --   --   --   HGB 8.9* 9.7* 9.4* 8.1*  HCT 28.2* 32.0* 29.0* 26.1*  MCV 105.2* 108.8* 102.8* 106.5*  PLT 194 200 206 696   Basic Metabolic Panel: Recent Labs  Lab 08/25/21 1930 08/26/21 1059 08/27/21 0832 08/28/21 0830  NA 139 136 135 135  K 3.8 4.5 4.5 3.0*  CL 98 96* 95* 95*  CO2 26 20* 22 26  GLUCOSE 112* 62* 133* 122*  BUN 42* 48* 64* 26*  CREATININE 6.48*   6.37* 6.70* 8.27* 4.30*  CALCIUM 10.1 9.8 9.7 9.4  PHOS  --   --  10.0* 5.5*   GFR: Estimated Creatinine Clearance: 12.8 mL/min (A) (by C-G formula based on SCr of 4.3 mg/dL (H)). Liver Function Tests: Recent Labs  Lab 08/25/21 1930 08/27/21 0832 08/28/21 0830  AST 20  --   --   ALT 8  --   --   ALKPHOS 101  --   --   BILITOT 1.1  --   --   PROT 6.4*  --   --   ALBUMIN 2.6* 2.5* 2.6*   No results for input(s): LIPASE, AMYLASE in the last 168 hours. No results for input(s): AMMONIA in the last 168 hours. Coagulation Profile: No results for input(s): INR, PROTIME in the last 168 hours. Cardiac Enzymes: No results for input(s): CKTOTAL, CKMB, CKMBINDEX, TROPONINI in the last 168 hours. BNP (last 3 results) No results for input(s): PROBNP in the last 8760 hours. HbA1C: No results for input(s): HGBA1C in the last 72 hours. CBG: No results for input(s): GLUCAP in the last 168 hours. Lipid Profile: No results for input(s): CHOL, HDL, LDLCALC, TRIG, CHOLHDL, LDLDIRECT in the last 72 hours. Thyroid Function Tests: No results for input(s): TSH, T4TOTAL, FREET4, T3FREE, THYROIDAB in the last 72 hours.  Anemia Panel: No results for input(s): VITAMINB12, FOLATE, FERRITIN, TIBC, IRON, RETICCTPCT in the last 72 hours. Sepsis Labs: No  results for input(s): PROCALCITON, LATICACIDVEN in the last 168 hours.  Recent Results (from the past 240 hour(s))  Resp Panel by RT-PCR (Flu A&B, Covid) Nasopharyngeal Swab     Status: None   Collection Time: 08/25/21 10:50 PM   Specimen: Nasopharyngeal Swab; Nasopharyngeal(NP) swabs in vial transport medium  Result Value Ref Range Status   SARS Coronavirus 2 by RT PCR NEGATIVE NEGATIVE Final    Comment: (NOTE) SARS-CoV-2 target nucleic acids are NOT DETECTED.  The SARS-CoV-2 RNA is generally detectable in upper respiratory specimens during the acute phase of infection. The lowest concentration of SARS-CoV-2 viral copies this assay can detect is 138 copies/mL. A negative result does not preclude SARS-Cov-2 infection and should not be used as the sole basis for treatment or other patient management decisions. A negative result may occur with  improper specimen collection/handling, submission of specimen other than nasopharyngeal swab,  presence of viral mutation(s) within the areas targeted by this assay, and inadequate number of viral copies(<138 copies/mL). A negative result must be combined with clinical observations, patient history, and epidemiological information. The expected result is Negative.  Fact Sheet for Patients:  EntrepreneurPulse.com.au  Fact Sheet for Healthcare Providers:  IncredibleEmployment.be  This test is no t yet approved or cleared by the [place] FDA and  has been authorized for detection and/or diagnosis of SARS-CoV-2 by FDA under an Emergency Use Authorization (EUA). This EUA will remain  in effect (meaning this test can be used) for the duration of the COVID-19 declaration under Section 564(b)(1) of the Act, 21 U.S.C.section 360bbb-3(b)(1), unless the authorization is terminated  or revoked sooner.       Influenza A by PCR NEGATIVE NEGATIVE Final   Influenza B by PCR NEGATIVE NEGATIVE Final    Comment:  (NOTE) The Xpert Xpress SARS-CoV-2/FLU/RSV plus assay is intended as an aid in the diagnosis of influenza from Nasopharyngeal swab specimens and should not be used as a sole basis for treatment. Nasal washings and aspirates are unacceptable for Xpert Xpress SARS-CoV-2/FLU/RSV testing.  Fact Sheet for Patients: EntrepreneurPulse.com.au  Fact Sheet for Healthcare Providers: IncredibleEmployment.be  This test is not yet approved or cleared by the [place] FDA and has been authorized for detection and/or diagnosis of SARS-CoV-2 by FDA under an Emergency Use Authorization (EUA). This EUA will remain in effect (meaning this test can be used) for the duration of the COVID-19 declaration under Section 564(b)(1) of the Act, 21 U.S.C. section 360bbb-3(b)(1), unless the authorization is terminated or revoked.  Performed at [facility], [place] [address], [place], [place] [address]          Radiology Studies: DG Chest 2 View  Result Date: 08/28/2021 CLINICAL DATA:  SOB EXAM: CHEST - 2 VIEW COMPARISON:  08/25/2021 FINDINGS: Right chest wall dialysis catheter is again noted with tip projecting over the distal SVC. Stable cardiomediastinal contours. Small right pleural effusion is unchanged. Atelectasis and or airspace disease within the right lower lobe is unchanged. Diffuse hazy lung opacities are again seen compatible with pulmonary edema. IMPRESSION: Persistent right pleural effusion with overlying atelectasis/airspace disease. Unchanged mild diffuse edema. Electronically Signed   By: Kerby Moors M.D.   On: 08/28/2021 18:13        Scheduled Meds:  apixaban  2.5 mg Oral BID   Chlorhexidine Gluconate Cloth  6 each Topical Q0600   feeding supplement (NEPRO CARB STEADY)  237 mL Oral BID BM   multivitamin  1 tablet Oral QHS   sevelamer carbonate  1,600 mg Oral TID WC   Continuous Infusions:  albumin human       LOS: 3 days    Time  spent: 30 minutes    Barb Merino, MD [facility] Pager [phone]

## 2021-08-29 NOTE — Significant Event (Signed)
Patient was given discharge instruction and refuses to accept appt days and times. States that he does not need paper work that it will not be read and will be thrown in the trash.

## 2021-09-07 ENCOUNTER — Emergency Department (HOSPITAL_COMMUNITY): Payer: Medicare Other

## 2021-09-07 ENCOUNTER — Inpatient Hospital Stay (HOSPITAL_COMMUNITY)
Admission: EM | Admit: 2021-09-07 | Discharge: 2021-09-13 | DRG: 291 | Disposition: A | Discharge status: Home Health | Payer: Medicare Other | Attending: Internal Medicine | Admitting: Internal Medicine

## 2021-09-07 ENCOUNTER — Encounter (HOSPITAL_COMMUNITY): Payer: Self-pay | Admitting: Emergency Medicine

## 2021-09-07 DIAGNOSIS — N186 End stage renal disease: Secondary | ICD-10-CM | POA: Diagnosis present

## 2021-09-07 DIAGNOSIS — J9611 Chronic respiratory failure with hypoxia: Secondary | ICD-10-CM | POA: Diagnosis not present

## 2021-09-07 DIAGNOSIS — D631 Anemia in chronic kidney disease: Secondary | ICD-10-CM | POA: Diagnosis present

## 2021-09-07 DIAGNOSIS — J90 Pleural effusion, not elsewhere classified: Secondary | ICD-10-CM

## 2021-09-07 DIAGNOSIS — R64 Cachexia: Secondary | ICD-10-CM | POA: Diagnosis present

## 2021-09-07 DIAGNOSIS — G8929 Other chronic pain: Secondary | ICD-10-CM | POA: Diagnosis present

## 2021-09-07 DIAGNOSIS — Z881 Allergy status to other antibiotic agents status: Secondary | ICD-10-CM | POA: Diagnosis not present

## 2021-09-07 DIAGNOSIS — Z8673 Personal history of transient ischemic attack (TIA), and cerebral infarction without residual deficits: Secondary | ICD-10-CM

## 2021-09-07 DIAGNOSIS — M5416 Radiculopathy, lumbar region: Secondary | ICD-10-CM | POA: Diagnosis present

## 2021-09-07 DIAGNOSIS — M1990 Unspecified osteoarthritis, unspecified site: Secondary | ICD-10-CM | POA: Diagnosis present

## 2021-09-07 DIAGNOSIS — Z681 Body mass index (BMI) 19 or less, adult: Secondary | ICD-10-CM | POA: Diagnosis not present

## 2021-09-07 DIAGNOSIS — D638 Anemia in other chronic diseases classified elsewhere: Secondary | ICD-10-CM

## 2021-09-07 DIAGNOSIS — G2581 Restless legs syndrome: Secondary | ICD-10-CM | POA: Diagnosis present

## 2021-09-07 DIAGNOSIS — Z7901 Long term (current) use of anticoagulants: Secondary | ICD-10-CM

## 2021-09-07 DIAGNOSIS — R778 Other specified abnormalities of plasma proteins: Secondary | ICD-10-CM

## 2021-09-07 DIAGNOSIS — I480 Paroxysmal atrial fibrillation: Secondary | ICD-10-CM | POA: Diagnosis present

## 2021-09-07 DIAGNOSIS — Z9115 Patient's noncompliance with renal dialysis: Secondary | ICD-10-CM

## 2021-09-07 DIAGNOSIS — I959 Hypotension, unspecified: Secondary | ICD-10-CM | POA: Diagnosis present

## 2021-09-07 DIAGNOSIS — I4891 Unspecified atrial fibrillation: Secondary | ICD-10-CM | POA: Diagnosis not present

## 2021-09-07 DIAGNOSIS — I272 Pulmonary hypertension, unspecified: Secondary | ICD-10-CM | POA: Diagnosis present

## 2021-09-07 DIAGNOSIS — E1122 Type 2 diabetes mellitus with diabetic chronic kidney disease: Secondary | ICD-10-CM | POA: Diagnosis present

## 2021-09-07 DIAGNOSIS — R509 Fever, unspecified: Secondary | ICD-10-CM | POA: Diagnosis not present

## 2021-09-07 DIAGNOSIS — K219 Gastro-esophageal reflux disease without esophagitis: Secondary | ICD-10-CM | POA: Diagnosis present

## 2021-09-07 DIAGNOSIS — N2581 Secondary hyperparathyroidism of renal origin: Secondary | ICD-10-CM | POA: Diagnosis present

## 2021-09-07 DIAGNOSIS — E43 Unspecified severe protein-calorie malnutrition: Secondary | ICD-10-CM | POA: Diagnosis present

## 2021-09-07 DIAGNOSIS — R0902 Hypoxemia: Secondary | ICD-10-CM

## 2021-09-07 DIAGNOSIS — R0602 Shortness of breath: Secondary | ICD-10-CM

## 2021-09-07 DIAGNOSIS — I5033 Acute on chronic diastolic (congestive) heart failure: Secondary | ICD-10-CM | POA: Diagnosis present

## 2021-09-07 DIAGNOSIS — I953 Hypotension of hemodialysis: Secondary | ICD-10-CM | POA: Diagnosis not present

## 2021-09-07 DIAGNOSIS — Z992 Dependence on renal dialysis: Secondary | ICD-10-CM

## 2021-09-07 DIAGNOSIS — E0781 Sick-euthyroid syndrome: Secondary | ICD-10-CM | POA: Diagnosis present

## 2021-09-07 DIAGNOSIS — Z91199 Patient's noncompliance with other medical treatment and regimen due to unspecified reason: Secondary | ICD-10-CM

## 2021-09-07 DIAGNOSIS — Z9981 Dependence on supplemental oxygen: Secondary | ICD-10-CM

## 2021-09-07 DIAGNOSIS — Z79899 Other long term (current) drug therapy: Secondary | ICD-10-CM

## 2021-09-07 DIAGNOSIS — E875 Hyperkalemia: Secondary | ICD-10-CM | POA: Diagnosis present

## 2021-09-07 DIAGNOSIS — J9621 Acute and chronic respiratory failure with hypoxia: Secondary | ICD-10-CM | POA: Diagnosis present

## 2021-09-07 DIAGNOSIS — I7121 Aneurysm of the ascending aorta, without rupture: Secondary | ICD-10-CM | POA: Diagnosis present

## 2021-09-07 DIAGNOSIS — I132 Hypertensive heart and chronic kidney disease with heart failure and with stage 5 chronic kidney disease, or end stage renal disease: Secondary | ICD-10-CM | POA: Diagnosis present

## 2021-09-07 DIAGNOSIS — J449 Chronic obstructive pulmonary disease, unspecified: Secondary | ICD-10-CM | POA: Diagnosis present

## 2021-09-07 DIAGNOSIS — E785 Hyperlipidemia, unspecified: Secondary | ICD-10-CM | POA: Diagnosis present

## 2021-09-07 DIAGNOSIS — R627 Adult failure to thrive: Secondary | ICD-10-CM | POA: Diagnosis present

## 2021-09-07 DIAGNOSIS — R54 Age-related physical debility: Secondary | ICD-10-CM | POA: Diagnosis present

## 2021-09-07 DIAGNOSIS — I083 Combined rheumatic disorders of mitral, aortic and tricuspid valves: Secondary | ICD-10-CM | POA: Diagnosis present

## 2021-09-07 DIAGNOSIS — Z809 Family history of malignant neoplasm, unspecified: Secondary | ICD-10-CM

## 2021-09-07 LAB — I-STAT CHEM 8, ED
BUN: 68 mg/dL — ABNORMAL HIGH (ref 8–23)
Calcium, Ion: 1.05 mmol/L — ABNORMAL LOW (ref 1.15–1.40)
Chloride: 101 mmol/L (ref 98–111)
Creatinine, Ser: 6.3 mg/dL — ABNORMAL HIGH (ref 0.61–1.24)
Glucose, Bld: 79 mg/dL (ref 70–99)
HCT: 33 % — ABNORMAL LOW (ref 39.0–52.0)
Hemoglobin: 11.2 g/dL — ABNORMAL LOW (ref 13.0–17.0)
Potassium: 7 mmol/L (ref 3.5–5.1)
Sodium: 133 mmol/L — ABNORMAL LOW (ref 135–145)
TCO2: 27 mmol/L (ref 22–32)

## 2021-09-07 LAB — HEPATIC FUNCTION PANEL
ALT: 18 U/L (ref 0–44)
AST: 22 U/L (ref 15–41)
Albumin: 3.2 g/dL — ABNORMAL LOW (ref 3.5–5.0)
Alkaline Phosphatase: 97 U/L (ref 38–126)
Bilirubin, Direct: 0.4 mg/dL — ABNORMAL HIGH (ref 0.0–0.2)
Indirect Bilirubin: 0.9 mg/dL (ref 0.3–0.9)
Total Bilirubin: 1.3 mg/dL — ABNORMAL HIGH (ref 0.3–1.2)
Total Protein: 7.3 g/dL (ref 6.5–8.1)

## 2021-09-07 LAB — CBC
HCT: 32.7 % — ABNORMAL LOW (ref 39.0–52.0)
Hemoglobin: 9.9 g/dL — ABNORMAL LOW (ref 13.0–17.0)
MCH: 32.9 pg (ref 26.0–34.0)
MCHC: 30.3 g/dL (ref 30.0–36.0)
MCV: 108.6 fL — ABNORMAL HIGH (ref 80.0–100.0)
Platelets: 151 10*3/uL (ref 150–400)
RBC: 3.01 MIL/uL — ABNORMAL LOW (ref 4.22–5.81)
RDW: 17.3 % — ABNORMAL HIGH (ref 11.5–15.5)
WBC: 5.3 10*3/uL (ref 4.0–10.5)
nRBC: 0 % (ref 0.0–0.2)

## 2021-09-07 LAB — TROPONIN I (HIGH SENSITIVITY): Troponin I (High Sensitivity): 78 ng/L — ABNORMAL HIGH (ref <18)

## 2021-09-07 LAB — BASIC METABOLIC PANEL
Anion gap: 17 — ABNORMAL HIGH (ref 5–15)
BUN: 46 mg/dL — ABNORMAL HIGH (ref 8–23)
CO2: 25 mmol/L (ref 22–32)
Calcium: 10.4 mg/dL — ABNORMAL HIGH (ref 8.9–10.3)
Chloride: 97 mmol/L — ABNORMAL LOW (ref 98–111)
Creatinine, Ser: 6.29 mg/dL — ABNORMAL HIGH (ref 0.61–1.24)
GFR, Estimated: 9 mL/min — ABNORMAL LOW (ref >60)
Glucose, Bld: 78 mg/dL (ref 70–99)
Potassium: 7 mmol/L (ref 3.5–5.1)
Sodium: 139 mmol/L (ref 135–145)

## 2021-09-07 LAB — MAGNESIUM: Magnesium: 2.7 mg/dL — ABNORMAL HIGH (ref 1.7–2.4)

## 2021-09-07 LAB — LACTIC ACID, PLASMA: Lactic Acid, Venous: 1.9 mmol/L (ref 0.5–1.9)

## 2021-09-07 LAB — BRAIN NATRIURETIC PEPTIDE: B Natriuretic Peptide: 4047.2 pg/mL — ABNORMAL HIGH (ref 0.0–100.0)

## 2021-09-07 MED ORDER — SODIUM CHLORIDE 0.9 % IV SOLN: 100.0000 mL | INTRAVENOUS | Status: DC | PRN

## 2021-09-07 MED ORDER — HEPARIN SODIUM (PORCINE) 1000 UNIT/ML DIALYSIS
1000.0000 [IU] | INTRAMUSCULAR | Status: DC | PRN
  Filled 2021-09-07: qty 1

## 2021-09-07 MED ORDER — DILTIAZEM HCL-DEXTROSE 125-5 MG/125ML-% IV SOLN (PREMIX)
5.0000 mg/h | INTRAVENOUS | Status: DC
  Administered 2021-09-07: 5 mg/h via INTRAVENOUS
  Administered 2021-09-08 – 2021-09-09 (×3): 15 mg/h via INTRAVENOUS
  Filled 2021-09-07 (×5): qty 125

## 2021-09-07 MED ORDER — DILTIAZEM LOAD VIA INFUSION
10.0000 mg | Freq: Once | INTRAVENOUS | Status: AC
Start: 2021-09-07 — End: 2021-09-07
  Administered 2021-09-07: 10 mg via INTRAVENOUS
  Filled 2021-09-07: qty 10

## 2021-09-07 MED ORDER — ALTEPLASE 2 MG IJ SOLR: 2.0000 mg | Freq: Once | INTRAMUSCULAR | Status: DC | PRN

## 2021-09-07 MED ORDER — SODIUM ZIRCONIUM CYCLOSILICATE 10 G PO PACK
10.0000 g | PACK | Freq: Once | ORAL | Status: AC
  Administered 2021-09-07: 10 g via ORAL
  Filled 2021-09-07: qty 1

## 2021-09-07 MED ORDER — DEXTROSE 50 % IV SOLN
50.0000 mL | Freq: Once | INTRAVENOUS | Status: AC
Start: 2021-09-07 — End: 2021-09-07
  Administered 2021-09-07: 50 mL via INTRAVENOUS
  Filled 2021-09-07: qty 50

## 2021-09-07 MED ORDER — LIDOCAINE-PRILOCAINE 2.5-2.5 % EX CREA: 1.0000 "application " | TOPICAL_CREAM | CUTANEOUS | Status: DC | PRN

## 2021-09-07 MED ORDER — SODIUM BICARBONATE 8.4 % IV SOLN
50.0000 meq | Freq: Once | INTRAVENOUS | Status: AC
  Administered 2021-09-07: 50 meq via INTRAVENOUS
  Filled 2021-09-07: qty 50

## 2021-09-07 MED ORDER — CALCIUM GLUCONATE-NACL 1-0.675 GM/50ML-% IV SOLN
1.0000 g | Freq: Once | INTRAVENOUS | Status: AC
  Administered 2021-09-07: 1000 mg via INTRAVENOUS
  Filled 2021-09-07: qty 50

## 2021-09-07 MED ORDER — FENTANYL CITRATE PF 50 MCG/ML IJ SOSY
50.0000 ug | PREFILLED_SYRINGE | Freq: Once | INTRAMUSCULAR | Status: AC
  Administered 2021-09-07: 50 ug via INTRAVENOUS
  Filled 2021-09-07: qty 1

## 2021-09-07 MED ORDER — PENTAFLUOROPROP-TETRAFLUOROETH EX AERO: 1.0000 "application " | INHALATION_SPRAY | CUTANEOUS | Status: DC | PRN

## 2021-09-07 MED ORDER — CHLORHEXIDINE GLUCONATE CLOTH 2 % EX PADS
6.0000 | MEDICATED_PAD | Freq: Every day | CUTANEOUS | Status: DC
  Administered 2021-09-08: 6 via TOPICAL

## 2021-09-07 MED ORDER — LIDOCAINE HCL (PF) 1 % IJ SOLN: 5.0000 mL | INTRAMUSCULAR | Status: DC | PRN

## 2021-09-07 MED ORDER — DILTIAZEM HCL ER COATED BEADS 120 MG PO CP24: 120.0000 mg | ORAL_CAPSULE | Freq: Once | ORAL | Status: DC

## 2021-09-07 NOTE — Assessment & Plan Note (Signed)
-      no EKG changes in the setting of  increased work of breathing and  chronic kidney disease likely due to demand ischemia and poor clearance, monitor on telemetry and cycle cardiac enzymes to trend.  if continues to rise will need further work-up

## 2021-09-07 NOTE — Assessment & Plan Note (Signed)
On baseline on 2.5 litters

## 2021-09-07 NOTE — ED Provider Triage Note (Signed)
Emergency Medicine Provider Triage Evaluation Note  Thomas Robinson , a 61 y.o. male  was evaluated in triage.  Patient has history of end-stage renal disease pt complains of weakness, shortness of breath, dizziness and feeling sick since yesterday.  Is on hemodialysis TTHS and missed today's session due to his illness.  History of A. fib/a flutter and reports it is getting worse.  Anticoagulated with Eliquis.  Review of Systems  Positive: Pain all over, nausea, shortness of breath, chest pain Negative: Has not vomited  Physical Exam  BP (!) 127/99 (BP Location: Left Arm)    Pulse (!) 107    Temp 98 F (36.7 C) (Oral)    Resp 20    SpO2 100%  Gen:   Awake, no distress   Resp:  Increased effort and shallow breathing.  Poor air movement. MSK:   Moves extremities without difficulty  Other:  Patient very jaundiced and ill-appearing.  Heart rate elevated.  Medical Decision Making  Medically screening exam initiated at 5:03 PM.  Appropriate orders placed.  [name] was informed that the remainder of the evaluation will be completed by another provider, this initial triage assessment does not replace that evaluation, and the importance of remaining in the ED until their evaluation is complete.   Nursing notified that patient needs room.  During blood draw patient slumped forward and stopped responding.  Alert and oriented at this time however very drowsy.   Rhae Hammock, PA-C 09/07/21 1709

## 2021-09-07 NOTE — Subjective & Objective (Signed)
Reports 2 days of feeling sick, reports fever at home, reports cough SOB and chest pain and all over his body No sick contacts No Diarrhea some N no vomiting Has not been been able to get to his HD today usually goes [place] sat

## 2021-09-07 NOTE — Assessment & Plan Note (Signed)
Initially treated with a diltiazem drip.  Currently we will hold to allow blood pressure for hemodialysis.  Maintain maps above 85 After hemodialysis completed could try to resume metoprolol if able to  Tolerate Continue Eliquis

## 2021-09-07 NOTE — Consult Note (Signed)
Reason for Consult: ESRD Referring Physician: Dr. Darl Householder  Chief Complaint: Weakness  Dialysis Orders:  MWF - [facility] 3.5h  400/500  51.5 kg  2/2 bath  RIJ TDC/  L AVF unusable - mircera 150 ug q2 last 12/21 -venofer 100mg  IV weekly   Assessment/Plan: ESRD - on HD MWF; patient continuous non-compliance with HD-leaves txs early in outpatient. Planning on  HD today w/ 1K bath 1st hour and then 2K in case he signs off early which is often the case. IV Albumin as needed. Plan for next HD Monday.  Keep SBP >85. Closely monitoring of symptoms. Hypertension/volume  - Still hypervolemic but difficulty removing fluid d/t low Bps (please see above). If patient remains asymptomatic, keep SBP >85. Anemia of CKD - Hgb 11.2; ESA not due yet; monitor trends Secondary Hyperparathyroidism - PO4 high-continue binders Hypoxemic Respiratory Failure 2nd Volume Overload/Acute on Chronic CHF-plan for HD; last ECHO 08/12/21 EF 60-65% Nutrition - Renal diet with fluid restriction GOC-Patient with multiple co-morbidities and ongoing non-compliance with HD.    HPI: Lex Linhares is an 61 y.o. male HTN, dCHF, CVA, ascending aortic aneurysm, PEA arrest 07/2021, aflutter, COPD on 2.5L O2 at home, ESRD with history of severe noncompliance. He's had many admission s for hypoxia in the setting of shortened or missed dialysis treatments. Hospital course has been complicated by aflutter, hematochezia, PEA aarest after sedation, septic shock. Of note he has also left AMA and revoked home hospice. He was just here 08/25/21 and 08/27/21 with chest pain and shortness of breath. CTA showed small RUL PE's recently. He is supposed to be on Eliquis for the aflutter w/ metoprolol sometimes stopped because of hypotension. His last treatments in the hospital were on 12/27 and 12/28; last outpatient treatments at [facility] were on 1/2 and 1/5 signing off early each time. 2hr84min and 2hr 24min, EDW appears to be 51.5-52 kg and he did  leave above his EDW last treatment. He missed dialysis today because he felt very weak and dizzy; also complained of generalized aching, chest pain and whole body itching.  ROS Pertinent items are noted in HPI.  Chemistry and CBC: Creatinine, Ser  Date/Time Value Ref Range Status  09/07/2021 05:50 PM 6.30 (H) 0.61 - 1.24 mg/dL Final  08/28/2021 08:30 AM 4.30 (H) 0.61 - 1.24 mg/dL Final    Comment:    DIALYSIS  08/27/2021 08:32 AM 8.27 (H) 0.61 - 1.24 mg/dL Final  08/26/2021 10:59 AM 6.70 (H) 0.61 - 1.24 mg/dL Final  08/25/2021 07:30 PM 6.37 (H) 0.61 - 1.24 mg/dL Final  08/25/2021 07:30 PM 6.48 (H) 0.61 - 1.24 mg/dL Final  08/12/2021 01:53 AM 5.70 (H) 0.61 - 1.24 mg/dL Final  08/11/2021 06:37 PM 5.28 (H) 0.61 - 1.24 mg/dL Final  07/26/2021 03:19 AM 3.31 (H) 0.61 - 1.24 mg/dL Final  07/25/2021 08:30 AM 2.36 (H) 0.61 - 1.24 mg/dL Final  07/25/2021 03:40 AM 3.20 (H) 0.61 - 1.24 mg/dL Final  07/24/2021 07:53 PM 3.16 (H) 0.61 - 1.24 mg/dL Final  07/24/2021 04:00 AM 2.32 (H) 0.61 - 1.24 mg/dL Final  07/23/2021 05:14 PM 1.73 (H) 0.61 - 1.24 mg/dL Final  07/23/2021 04:31 AM 1.36 (H) 0.61 - 1.24 mg/dL Final  07/22/2021 04:31 PM 1.41 (H) 0.61 - 1.24 mg/dL Final  07/22/2021 04:54 AM 1.64 (H) 0.61 - 1.24 mg/dL Final  07/22/2021 03:53 AM 1.61 (H) 0.61 - 1.24 mg/dL Final  07/21/2021 04:00 AM 2.31 (H) 0.61 - 1.24 mg/dL Final    Comment:  DELTA CHECK NOTED  07/20/2021 04:25 AM 4.77 (H) 0.61 - 1.24 mg/dL Final  07/20/2021 04:25 AM 4.68 (H) 0.61 - 1.24 mg/dL Final  07/20/2021 04:25 AM 4.77 (H) 0.61 - 1.24 mg/dL Final  07/19/2021 03:38 AM 8.33 (H) 0.61 - 1.24 mg/dL Final  07/18/2021 03:48 AM 7.38 (H) 0.61 - 1.24 mg/dL Final  07/17/2021 02:32 AM 10.45 (H) 0.61 - 1.24 mg/dL Final  07/16/2021 09:22 AM 10.28 (H) 0.61 - 1.24 mg/dL Final  07/08/2021 06:08 AM 8.29 (H) 0.61 - 1.24 mg/dL Final  07/07/2021 08:25 PM 7.79 (H) 0.61 - 1.24 mg/dL Final  07/05/2021 06:14 AM 4.97 (H) 0.61 - 1.24 mg/dL Final   07/04/2021 02:49 AM 7.22 (H) 0.61 - 1.24 mg/dL Final  07/03/2021 01:31 PM 6.78 (H) 0.61 - 1.24 mg/dL Final  07/03/2021 04:09 AM 6.46 (H) 0.61 - 1.24 mg/dL Final  07/02/2021 04:00 PM 5.52 (H) 0.61 - 1.24 mg/dL Final  07/02/2021 04:20 AM 7.90 (H) 0.61 - 1.24 mg/dL Final  07/01/2021 01:29 PM 9.22 (H) 0.61 - 1.24 mg/dL Final  06/30/2021 06:58 AM 7.63 (H) 0.61 - 1.24 mg/dL Final  06/29/2021 10:37 PM 6.71 (H) 0.61 - 1.24 mg/dL Final  06/01/2021 08:39 AM 4.89 (H) 0.61 - 1.24 mg/dL Final  05/31/2021 03:52 AM 6.36 (H) 0.61 - 1.24 mg/dL Final  05/31/2021 12:22 AM 6.08 (H) 0.61 - 1.24 mg/dL Final  05/30/2021 05:00 PM 5.65 (H) 0.61 - 1.24 mg/dL Final  05/18/2021 02:52 PM 4.90 (H) 0.61 - 1.24 mg/dL Final  04/04/2021 06:40 AM 8.80 (H) 0.61 - 1.24 mg/dL Final  03/29/2021 02:21 AM 9.60 (H) 0.61 - 1.24 mg/dL Final  03/29/2021 02:05 AM 8.84 (H) 0.61 - 1.24 mg/dL Final    Comment:    DELTA CHECK NOTED  02/18/2021 02:02 AM 8.30 (H) 0.61 - 1.24 mg/dL Final  02/18/2021 01:07 AM 7.86 (H) 0.61 - 1.24 mg/dL Final  02/17/2021 10:45 AM 6.96 (H) 0.61 - 1.24 mg/dL Final  02/14/2021 10:25 PM 9.20 (H) 0.61 - 1.24 mg/dL Final  02/14/2021 10:08 PM 8.56 (H) 0.61 - 1.24 mg/dL Final  02/04/2021 04:48 AM 5.31 (H) 0.61 - 1.24 mg/dL Final    Comment:    DELTA CHECK NOTED  02/03/2021 08:38 AM 9.83 (H) 0.61 - 1.24 mg/dL Final   Recent Labs  Lab 09/07/21 1750  NA 133*  K 7.0*  CL 101  GLUCOSE 79  BUN 68*  CREATININE 6.30*   Recent Labs  Lab 09/07/21 1741 09/07/21 1750  WBC 5.3  --   HGB 9.9* 11.2*  HCT 32.7* 33.0*  MCV 108.6*  --   PLT 151  --    Liver Function Tests: No results for input(s): AST, ALT, ALKPHOS, BILITOT, PROT, ALBUMIN in the last 168 hours. No results for input(s): LIPASE, AMYLASE in the last 168 hours. No results for input(s): AMMONIA in the last 168 hours. Cardiac Enzymes: No results for input(s): CKTOTAL, CKMB, CKMBINDEX, TROPONINI in the last 168 hours. Iron Studies: No results for  input(s): IRON, TIBC, TRANSFERRIN, FERRITIN in the last 72 hours. PT/INR: @LABRCNTIP (inr:5)  Xrays/Other Studies: ) Results for orders placed or performed during the hospital encounter of 09/07/21 (from the past 48 hour(s))  CBC     Status: Abnormal   Collection Time: 09/07/21  5:41 PM  Result Value Ref Range   WBC 5.3 4.0 - 10.5 K/uL   RBC 3.01 (L) 4.22 - 5.81 MIL/uL   Hemoglobin 9.9 (L) 13.0 - 17.0 g/dL   HCT 32.7 (L) 39.0 - 52.0 %  MCV 108.6 (H) 80.0 - 100.0 fL   MCH 32.9 26.0 - 34.0 pg   MCHC 30.3 30.0 - 36.0 g/dL   RDW 17.3 (H) 11.5 - 15.5 %   Platelets 151 150 - 400 K/uL   nRBC 0.0 0.0 - 0.2 %    Comment: Performed at [facility] [address], [place], [place] [address]  I-stat chem 8, ED (not at [facility] or [facility])     Status: Abnormal   Collection Time: 09/07/21  5:50 PM  Result Value Ref Range   Sodium 133 (L) 135 - 145 mmol/L   Potassium 7.0 (HH) 3.5 - 5.1 mmol/L   Chloride 101 98 - 111 mmol/L   BUN 68 (H) 8 - 23 mg/dL   Creatinine, Ser 6.30 (H) 0.61 - 1.24 mg/dL   Glucose, Bld 79 70 - 99 mg/dL    Comment: Glucose reference range applies only to samples taken after fasting for at least 8 hours.   Calcium, Ion 1.05 (L) 1.15 - 1.40 mmol/L   TCO2 27 22 - 32 mmol/L   Hemoglobin 11.2 (L) 13.0 - 17.0 g/dL   HCT 33.0 (L) 39.0 - 52.0 %   Comment NOTIFIED PHYSICIAN    DG Chest Port 1 View  Result Date: 09/07/2021 CLINICAL DATA:  Weakness and shortness of breath. EXAM: PORTABLE CHEST 1 VIEW COMPARISON:  Most recent radiograph 08/28/2021. Chest CTA 08/26/2021 FINDINGS: Right-sided dialysis catheter unchanged in position. Cardiomegaly is stable. Increasing right pleural effusion and basilar atelectasis. Increasing pulmonary edema. No pneumothorax. Stable osseous structures. IMPRESSION: 1. Increasing right pleural effusion and basilar atelectasis. 2. Increasing pulmonary edema. Electronically Signed   By: Keith Rake M.D.   On: 09/07/2021 17:58    PMH:   Past Medical  History:  Diagnosis Date   Anaphylactic shock, unspecified, sequela 06/10/2019   Aortic insufficiency    Aortic stenosis    Arthritis    Asthma, chronic, unspecified asthma severity, with acute exacerbation 10/23/2017   CAP (community acquired pneumonia) 10/23/2017   Carpal tunnel syndrome of right wrist 10/05/2018   Cataract    right - removed by surgery   Cerebral infarction due to thrombosis of cerebral artery (HCC)    Cervical disc herniation 01/04/2020   Chronic diastolic heart failure ([place]) 04/13/2018   Chronic low back pain 11/24/2019   Constipation    COPD (chronic obstructive pulmonary disease) (HCC)    Cough    chronic cough   Encounter for immunization 07/08/2017   ESRD on hemodialysis ([place]) 02/16/2018   Fall 06/04/2019   GERD (gastroesophageal reflux disease) 06/04/2019   GI bleeding 05/24/2017   Gram-negative sepsis, unspecified ([place]) 09/05/2017   History of fusion of cervical spine 03/26/2020   Hyperlipidemia    Hypertension    Hypokalemia 06/04/2017   Hypotension    Iron deficiency anemia, unspecified 09/09/2017   Left shoulder pain 11/11/2019   Lumbar radiculopathy 11/24/2019   Lung contusion 06/04/2019   LVH (left ventricular hypertrophy) due to hypertensive disease, with heart failure ([place]) 06/18/2020   Macrocytic anemia 05/24/2017   Mitral regurgitation    Moderate protein-calorie malnutrition (HCC) 06/06/2017   Myofascial pain syndrome 06/12/2020   Neuritis of right ulnar nerve 09/13/2018   Non-compliance with renal dialysis ([place]) 02/08/2020   Oxygen deficiency 12/30/2019   O2 sats on RA 87% at PAT appt    Pain in joint of right elbow 09/13/2018   Paroxysmal atrial flutter ([place])    PEA (Pulseless electrical activity) ([place])  after sedation in 07/2021   Pulmonary hypertension ([place])    Renovascular hypertension 06/22/2020   Restless leg syndrome    Rib fractures 06/2019   Right   S/P cardiac cath 08/27/2020   normal coronary arteries.    Secondary hyperparathyroidism of renal origin ([place]) 06/04/2017   Thoracic ascending aortic aneurysm    Tricuspid regurgitation    Ulnar neuropathy 10/05/2018   Volume overload 10/23/2017    PSH:   Past Surgical History:  Procedure Laterality Date   A/V FISTULAGRAM N/A 01/01/2021   Procedure: A/V FISTULAGRAM;  Surgeon: Serafina Mitchell, MD;  Location: [facility];  Service: Cardiovascular;  Laterality: N/A;   A/V FISTULAGRAM N/A 01/15/2021   Procedure: A/V [id number];  Surgeon: Serafina Mitchell, MD;  Location: [facility];  Service: Cardiovascular;  Laterality: N/A;   ANTERIOR CERVICAL DECOMP/DISCECTOMY FUSION N/A 01/04/2020   Procedure: ANTERIOR CERVICAL DECOMPRESSION/DISCECTOMY FUSION CERVICAL FIVE THROUGH SEVEN;  Surgeon: Melina Schools, MD;  Location: [name];  Service: Orthopedics;  Laterality: N/A;  3 hrs   AV FISTULA PLACEMENT Left 05/28/2017   Procedure: LEFT ARM ARTERIOVENOUS (AV) FISTULA CREATION;  Surgeon: Conrad Cacao, MD;  Location: [facility];  Service: Vascular;  Laterality: Left;   AV FISTULA PLACEMENT Left 02/02/2021   Procedure: Debride left forearm, ligation of left upper arm Aretiovenous fistula;  Surgeon: Elam Dutch, MD;  Location: [facility];  Service: Vascular;  Laterality: Left;   AV FISTULA PLACEMENT Right 04/04/2021   Procedure: RIGHT ARTERIOVENOUS (AV) FISTULA CREATION;  Surgeon: Serafina Mitchell, MD;  Location: [facility];  Service: Vascular;  Laterality: Right;   BIOPSY  07/10/2021   Procedure: BIOPSY;  Surgeon: Lavena Bullion, DO;  Location: [facility];  Service: Gastroenterology;;   COLONOSCOPY WITH PROPOFOL N/A 07/10/2021   Procedure: COLONOSCOPY WITH PROPOFOL;  Surgeon: Lavena Bullion, DO;  Location: [facility];  Service: Gastroenterology;  Laterality: N/A;   ESOPHAGOGASTRODUODENOSCOPY (EGD) WITH PROPOFOL N/A 07/10/2021   Procedure: ESOPHAGOGASTRODUODENOSCOPY (EGD) WITH PROPOFOL;  Surgeon: Lavena Bullion, DO;  Location: [facility];  Service:  Gastroenterology;  Laterality: N/A;   EYE SURGERY Right 06/02/2019   Cataract removed   FRACTURE SURGERY     INSERTION OF DIALYSIS CATHETER Right 02/02/2021   Procedure: INSERTION OF Right Internal jugular TUNNELED  DIALYSIS CATHETER;  Surgeon: Elam Dutch, MD;  Location: [facility];  Service: Vascular;  Laterality: Right;   IR DIALY SHUNT INTRO NEEDLE/INTRACATH INITIAL W/IMG LEFT Left 09/12/2020   IR FLUORO GUIDE CV LINE RIGHT  05/25/2017   IR FLUORO GUIDE CV LINE RIGHT  07/17/2021   IR US GUIDE VASC ACCESS LEFT  09/12/2020   IR US GUIDE VASC ACCESS RIGHT  05/25/2017   IR US GUIDE VASC ACCESS RIGHT  07/17/2021   LIGATION OF ARTERIOVENOUS  FISTULA Left 01/10/2021   Procedure: LIGATION OF LEFT ARM RADIOCEPHALIC FISTULA;  Surgeon: Serafina Mitchell, MD;  Location: [facility];  Service: Vascular;  Laterality: Left;   PERIPHERAL VASCULAR BALLOON ANGIOPLASTY Left 01/15/2021   Procedure: PERIPHERAL VASCULAR BALLOON ANGIOPLASTY;  Surgeon: Serafina Mitchell, MD;  Location: [facility];  Service: Cardiovascular;  Laterality: Left;  AVF   REVISON OF ARTERIOVENOUS FISTULA Left 07/18/2019   Procedure: REVISION PLICATION OF RADIOCEPHALIC ARTERIOVENOUS FISTULA LEFT ARM;  Surgeon: Angelia Mould, MD;  Location: [facility];  Service: Vascular;  Laterality: Left;   REVISON OF ARTERIOVENOUS FISTULA Left 01/10/2021   Procedure: CONVERSION TO BRACHIOCEPHALIC ARTERIOVENOUS FISTULA;  Surgeon: Harold Barban  W, MD;  Location: [facility];  Service: Vascular;  Laterality: Left;   RINOPLASTY      Allergies:  Allergies  Allergen Reactions   Vancomycin Shortness Of Breath and Itching    Medications:   Prior to Admission medications   Medication Sig Start Date End Date Taking? Authorizing Provider  albuterol (VENTOLIN HFA) 108 (90 Base) MCG/ACT inhaler Inhale 2 puffs into the lungs 2 (two) times daily as needed for shortness of breath or wheezing. Patient not taking: Reported on 08/11/2021 06/01/21   Oswald Hillock, MD   apixaban (ELIQUIS) 2.5 MG TABS tablet Take 1 tablet (2.5 mg total) by mouth 2 (two) times daily. Patient not taking: Reported on 07/16/2021 07/13/21 08/12/21  Darliss Cheney, MD  cinacalcet (SENSIPAR) 60 MG tablet Take 1 tablet (60 mg total) by mouth every evening. Patient not taking: Reported on 07/16/2021 06/01/21   Oswald Hillock, MD  metoprolol tartrate (LOPRESSOR) 50 MG tablet Take 1 tablet (50 mg total) by mouth 2 (two) times daily. 06/01/21   Oswald Hillock, MD  multivitamin (RENA-VIT) TABS tablet Take 1 tablet by mouth at bedtime. 01/12/18   [provider]  sevelamer carbonate (RENVELA) 800 MG tablet Take 800-1,600 mg by mouth See admin instructions. Take 1-2 tablets ([phone] mg) by mouth three times daily with meals, take 1 tablet (800 mg) with snacks 05/31/21   [provider]    Discontinued Meds:  There are no discontinued medications.  Social History:  reports that he has never smoked. He has never used smokeless tobacco. He reports that he does not currently use alcohol. He reports that he does not use drugs.  Family History:   Family History  Problem Relation Age of Onset   Cancer Mother     Blood pressure (!) 127/99, pulse (!) 107, temperature 98 F (36.7 C), temperature source Oral, resp. rate 20, SpO2 100 %. Gen: Alert, nad  Neck: No jvd Chest: cta bilat, no rales, dec'd R base Cor: irreg irreg  Abd: soft ntnd no ascites Ext: no LE edema Neuro: Alert, NF, ox3 Access:  RIJ TDC / RUA BBF + pulse       Analiz Tvedt, Hunt Oris, MD 09/07/2021, 6:27 PM

## 2021-09-07 NOTE — ED Triage Notes (Signed)
Pt arrives via EMS from home with feeling sick since yesterday. Endorses weakness, SOB. Pt due to HD today but was unable to go due to weakness. Pt endorses CP. HR ranging from 100-150.

## 2021-09-07 NOTE — Assessment & Plan Note (Signed)
In the setting of noncompliance with hemodialysis nephrology is aware plan for hemodialysis tonight follow labs afterwards.  Was treated in the ER already with [facility]

## 2021-09-07 NOTE — ED Notes (Signed)
DR.Yao made aware of Chem8 lab results. ED-Lab.

## 2021-09-07 NOTE — Assessment & Plan Note (Signed)
In the setting of noncompliance with hemodialysis will dialyze tonight and continue nephrology is aware.  Patient has had recent echogram.  We will hold off.  Dialysis dependent currently

## 2021-09-07 NOTE — ED Provider Notes (Signed)
[facility] EMERGENCY DEPARTMENT Provider Note   CSN: [id number] Arrival date & time: 09/07/21  1628     History  Chief Complaint  Patient presents with   Weakness    Thomas Robinson is a 61 y.o. male hx of ESRD on dialysis (missed dialysis today), PE on Eliquis, A. fib here presenting with weakness and dizziness.  Patient states that he just does not feel well today.  He has some subjective weakness and shortness of breath.  He also has some palpitations and feels very dizzy.  Consequently, he missed his dialysis.  He was noted to be in rapid A. fib in triage and brought back to the main ED. recently admitted for rapid A. fib as well  The history is provided by the patient.      Home Medications Prior to Admission medications   Medication Sig Start Date End Date Taking? Authorizing Provider  albuterol (VENTOLIN HFA) 108 (90 Base) MCG/ACT inhaler Inhale 2 puffs into the lungs 2 (two) times daily as needed for shortness of breath or wheezing. Patient not taking: Reported on 08/11/2021 06/01/21   Oswald Hillock, MD  apixaban (ELIQUIS) 2.5 MG TABS tablet Take 1 tablet (2.5 mg total) by mouth 2 (two) times daily. Patient not taking: Reported on 07/16/2021 07/13/21 08/12/21  Darliss Cheney, MD  cinacalcet (SENSIPAR) 60 MG tablet Take 1 tablet (60 mg total) by mouth every evening. Patient not taking: Reported on 07/16/2021 06/01/21   Oswald Hillock, MD  metoprolol tartrate (LOPRESSOR) 50 MG tablet Take 1 tablet (50 mg total) by mouth 2 (two) times daily. 06/01/21   Oswald Hillock, MD  multivitamin (RENA-VIT) TABS tablet Take 1 tablet by mouth at bedtime. 01/12/18   [provider]  sevelamer carbonate (RENVELA) 800 MG tablet Take 800-1,600 mg by mouth See admin instructions. Take 1-2 tablets ([phone] mg) by mouth three times daily with meals, take 1 tablet (800 mg) with snacks 05/31/21   [provider]      Allergies    Vancomycin    Review of Systems    Review of Systems  Constitutional:  Positive for fatigue.  Respiratory:  Positive for shortness of breath.   Neurological:  Positive for weakness.  All other systems reviewed and are negative.  Physical Exam Updated Vital Signs BP (!) 127/99 (BP Location: Left Arm)    Pulse (!) 107    Temp 98 F (36.7 C) (Oral)    Resp 20    SpO2 100%  Physical Exam Vitals and nursing note reviewed.  Constitutional:      Comments: Ill-appearing  HENT:     Head: Normocephalic.     Nose: Nose normal.     Mouth/Throat:     Mouth: Mucous membranes are moist.  Eyes:     Extraocular Movements: Extraocular movements intact.     Pupils: Pupils are equal, round, and reactive to light.  Cardiovascular:     Rate and Rhythm: Tachycardia present. Rhythm irregular.     Pulses: Normal pulses.  Pulmonary:     Comments: Tachypneic and diminished bilaterally Abdominal:     General: Abdomen is flat.     Palpations: Abdomen is soft.  Musculoskeletal:        General: Normal range of motion.     Cervical back: Normal range of motion and neck supple.  Skin:    General: Skin is warm.     Capillary Refill: Capillary refill takes less than 2 seconds.  Neurological:  General: No focal deficit present.     Mental Status: He is alert and oriented to person, place, and time.  Psychiatric:        Mood and Affect: Mood normal.        Behavior: Behavior normal.    ED Results / Procedures / Treatments   Labs (all labs ordered are listed, but only abnormal results are displayed) Labs Reviewed  CBC - Abnormal; Notable for the following components:      Result Value   RBC 3.01 (*)    Hemoglobin 9.9 (*)    HCT 32.7 (*)    MCV 108.6 (*)    RDW 17.3 (*)    All other components within normal limits  I-STAT CHEM 8, ED - Abnormal; Notable for the following components:   Sodium 133 (*)    Potassium 7.0 (*)    BUN 68 (*)    Creatinine, Ser 6.30 (*)    Calcium, Ion 1.05 (*)    Hemoglobin 11.2 (*)    HCT 33.0  (*)    All other components within normal limits  CULTURE, BLOOD (ROUTINE X 2)  CULTURE, BLOOD (ROUTINE X 2)  RESP PANEL BY RT-PCR (FLU A&B, COVID) ARPGX2  BASIC METABOLIC PANEL  HEPATIC FUNCTION PANEL  MAGNESIUM  BRAIN NATRIURETIC PEPTIDE  URINALYSIS, ROUTINE W REFLEX MICROSCOPIC  TROPONIN I (HIGH SENSITIVITY)    EKG EKG Interpretation  Date/Time:  Saturday September 07 2021 17:21:11 EST Ventricular Rate:  135 PR Interval:    QRS Duration: 83 QT Interval:  327 QTC Calculation: 510 R Axis:   -89 Text Interpretation: Atrial fibrillation Left anterior fascicular block Borderline low voltage, extremity leads Repol abnrm suggests ischemia, lateral leads Prolonged QT interval Since last tracing rate faster Confirmed by Wandra Arthurs [phone]) on 09/07/2021 5:42:15 PM  Radiology DG Chest Port 1 View  Result Date: 09/07/2021 CLINICAL DATA:  Weakness and shortness of breath. EXAM: PORTABLE CHEST 1 VIEW COMPARISON:  Most recent radiograph 08/28/2021. Chest CTA 08/26/2021 FINDINGS: Right-sided dialysis catheter unchanged in position. Cardiomegaly is stable. Increasing right pleural effusion and basilar atelectasis. Increasing pulmonary edema. No pneumothorax. Stable osseous structures. IMPRESSION: 1. Increasing right pleural effusion and basilar atelectasis. 2. Increasing pulmonary edema. Electronically Signed   By: Keith Rake M.D.   On: 09/07/2021 17:58    Procedures Procedures     CRITICAL CARE Performed by: Wandra Arthurs   Total critical care time: 30 minutes  Critical care time was exclusive of separately billable procedures and treating other patients.  Critical care was necessary to treat or prevent imminent or life-threatening deterioration.  Critical care was time spent personally by me on the following activities: development of treatment plan with patient and/or surrogate as well as nursing, discussions with consultants, evaluation of patient's response to treatment,  examination of patient, obtaining history from patient or surrogate, ordering and performing treatments and interventions, ordering and review of laboratory studies, ordering and review of radiographic studies, pulse oximetry and re-evaluation of patient's condition.  Medications Ordered in ED Medications  diltiazem (CARDIZEM) 1 mg/mL load via infusion 10 mg (has no administration in time range)    And  diltiazem (CARDIZEM) 125 mg in dextrose 5% 125 mL (1 mg/mL) infusion (has no administration in time range)  fentaNYL (SUBLIMAZE) injection 50 mcg (has no administration in time range)  calcium gluconate 1 g/ 50 mL sodium chloride IVPB (has no administration in time range)  sodium bicarbonate injection 50 mEq (has no administration in time  range)  dextrose 50 % solution 50 mL (has no administration in time range)  sodium zirconium cyclosilicate (LOKELMA) packet 10 g (has no administration in time range)    ED Course/ Medical Decision Making/ A&P                           Medical Decision Making Traeson Dusza is a 62 y.o. male who presented with rapid A. fib and weakness.  Patient did miss his dialysis.  Concern for hyperkalemia as well.  Plan to check BMP and CBC and troponin.  We will get a chest x-ray and COVID test as well.  6:22 PM Patient's potassium is 7.  Difficult to see if patient has peaked T waves due to tachycardia in the 150s.  Patient started on Cardizem drip.  I also ordered bicarb and glucose and calcium and Lokelma for hyperkalemia.  I talked to Dr. Augustin Coupe from nephrology.  He states that he will try and dialyze patient sometime tonight.  Hospitalist will need to admit  8:28 PM HR down to the 90s. Will go to dialysis soon. Hospitalist to admit for rapid afib, hyperkalemia    Amount and/or Complexity of Data Reviewed Independent Historian: parent External Data Reviewed: labs and radiology. Labs: ordered. Radiology: ordered and independent interpretation  performed. ECG/medicine tests: ordered and independent interpretation performed.  Risk Decision regarding hospitalization.  Final Clinical Impression(s) / ED Diagnoses Final diagnoses:  SOB (shortness of breath)    Rx / DC Orders ED Discharge Orders     None         Drenda Freeze, MD 09/07/21 2028

## 2021-09-07 NOTE — Assessment & Plan Note (Signed)
Nephrology is aware plant to HD tonight 1.  IV Albumin as needed. Plan for next HD Monday.  Keep SBP >85. Closely monitoring of symptoms.

## 2021-09-07 NOTE — H&P (Signed)
Thomas Robinson DGL:[id number] DOB: December 20, 1960 DOA: 09/07/2021     PCP: [name], PA   Outpatient Specialists:     NEphrology:        Patient arrived to ER on 09/07/21 at 1628 Referred by Attending Toy Baker, MD   Patient coming from: home Lives alone,     Chief Complaint:   Chief Complaint  Patient presents with   Weakness    HPI: Thomas Robinson is a 61 y.o. male with medical history significant of ESRD on HD t H s, Diastolic CHF,  Atrial fibrillation, on chronic oxygen 2.5 L, COPD  Presented with   shortness of breath cough Reports 2 days of feeling sick, reports fever at home, reports cough SOB and chest pain and all over his body No sick contacts No Diarrhea some N no vomiting Has not been been able to get to his HD today usually goes TH sat   Patient with recurrent admissions with failure and fluid overload in the setting of noncompliance with HD. Last similar admission was just last week was discharged on 29 August 2021    Initial COVID TEST  in house  PCR testing  Pending  Lab Results  Component Value Date   [place] 08/25/2021   [place] NEGATIVE 08/12/2021   [place] NEGATIVE 07/16/2021   [place] NEGATIVE 07/07/2021     Regarding pertinent Chronic problems:       HTN on metoprolol May need midodrine with hemodialysis as she has propensity to hypotension   chronic CHF diastolic  - last echo dec 2022  Grade I  diastolic dysfunction    COPD -   on baseline oxygen  2.5L,      A. Fib -  - CHA2DS2 vas score 5       current  on anticoagulation with   Eliquis,           -  Rate control:  Currently controlled with  Metoprolol    Chronically in A. fib with RVR with difficult to manage atrial fibrillation secondary to intradialytic hypotension was started his beta-blockers were held In the past cardiology recommended using midodrine as needed with dialysis and can try low-dose beta-blocker 12.5 for metoprolol  twice a day if needed.  Amiodarone was not an option secondary to QT prolongation Patient is on Eliquis  ESRD on HD [facility]  Lab Results  Component Value Date   CREATININE 6.30 (H) 09/07/2021   CREATININE 6.29 (H) 09/07/2021   CREATININE 4.30 (H) 08/28/2021     Chronic anemia - baseline hg Hemoglobin & Hematocrit  Recent Labs    08/28/21 0830 09/07/21 1741 09/07/21 1750  HGB 8.1* 9.9* 11.2*     While in ER:    Was found to be in A. fib of RVR initially started on diltiazem drip also noted to have elevated potassium up to 7 plan was for patient to undergo hemodialysis but was unable at first secondary to being on diltiazem drip this was discontinued patient heart rate will remain in the low 100 and teens Plan for patient to undergo  hemodialysis tonight Hyperkalemia was treated with Lokelma Ordered     CXR -  fluid overload    Following Medications were ordered in ER: Medications  diltiazem (CARDIZEM) 1 mg/mL load via infusion 10 mg (10 mg Intravenous Bolus from Bag 09/07/21 1840)    And  diltiazem (CARDIZEM) 125 mg in dextrose 5% 125 mL (1 mg/mL) infusion (0 mg/hr Intravenous Stopped  09/07/21 2014)  Chlorhexidine Gluconate Cloth 2 % PADS 6 each (has no administration in time range)  fentaNYL (SUBLIMAZE) injection 50 mcg (50 mcg Intravenous Given 09/07/21 1835)  calcium gluconate 1 g/ 50 mL sodium chloride IVPB (0 mg Intravenous Stopped 09/07/21 1945)  sodium bicarbonate injection 50 mEq (50 mEq Intravenous Given 09/07/21 1836)  dextrose 50 % solution 50 mL (50 mLs Intravenous Given 09/07/21 1843)  sodium zirconium cyclosilicate (LOKELMA) packet 10 g (10 g Oral Given 09/07/21 2024)    _______________________________________________________ ER Provider Called:   Nephrology Dr.LIn They Recommend admit to medicine    SEEN in ER plan for hemodialysis tonight   ED Triage Vitals  Enc Vitals Group     BP 09/07/21 1638 (!) 127/99     Pulse Rate 09/07/21 1638 (!) 107     Resp 09/07/21  1638 20     Temp 09/07/21 1638 98 F (36.7 C)     Temp Source 09/07/21 1638 Oral     SpO2 09/07/21 1631 96 %     Weight --      Height --      Head Circumference --      Peak Flow --      Pain Score 09/07/21 1639 10     Pain Loc --      Pain Edu? --      Excl. in [place]? --   TMAX(24)@     _________________________________________ Significant initial  Findings: Abnormal Labs Reviewed  BASIC METABOLIC PANEL - Abnormal; Notable for the following components:      Result Value   Potassium 7.0 (*)    Chloride 97 (*)    BUN 46 (*)    Creatinine, Ser 6.29 (*)    Calcium 10.4 (*)    GFR, Estimated 9 (*)    Anion gap 17 (*)    All other components within normal limits  CBC - Abnormal; Notable for the following components:   RBC 3.01 (*)    Hemoglobin 9.9 (*)    HCT 32.7 (*)    MCV 108.6 (*)    RDW 17.3 (*)    All other components within normal limits  HEPATIC FUNCTION PANEL - Abnormal; Notable for the following components:   Albumin 3.2 (*)    Total Bilirubin 1.3 (*)    Bilirubin, Direct 0.4 (*)    All other components within normal limits  MAGNESIUM - Abnormal; Notable for the following components:   Magnesium 2.7 (*)    All other components within normal limits  BRAIN NATRIURETIC PEPTIDE - Abnormal; Notable for the following components:   B Natriuretic Peptide 4,047.2 (*)    All other components within normal limits  I-STAT CHEM 8, ED - Abnormal; Notable for the following components:   Sodium 133 (*)    Potassium 7.0 (*)    BUN 68 (*)    Creatinine, Ser 6.30 (*)    Calcium, Ion 1.05 (*)    Hemoglobin 11.2 (*)    HCT 33.0 (*)    All other components within normal limits  TROPONIN I (HIGH SENSITIVITY) - Abnormal; Notable for the following components:   Troponin I (High Sensitivity) 78 (*)    All other components within normal limits     _________________________ Troponin 78 ECG: Ordered Personally reviewed by me showing: HR : 135 Rhythm: A.fib. W RVR   no evidence  of ischemic changes QTC 510    The recent clinical data is shown below. Vitals:   09/07/21 2100  09/07/21 2114 09/07/21 2115 09/07/21 2145  BP: 111/88  118/67 111/68  Pulse: 99  75 84  Resp: (!) 22  18 (!) 26  Temp:  98.1 F (36.7 C)    TempSrc:  Oral    SpO2: 93%  92% (!) 83%    WBC     Component Value Date/Time   WBC 5.3 09/07/2021 1741   LYMPHSABS 0.8 08/25/2021 1930   MONOABS 0.3 08/25/2021 1930   EOSABS 0.3 08/25/2021 1930   BASOSABS 0.1 08/25/2021 1930    Lactic Acid, Venous    Component Value Date/Time   LATICACIDVEN 1.9 09/07/2021 2044        Results for orders placed or performed during the hospital encounter of 08/25/21  Resp Panel by RT-PCR (Flu A&B, Covid) Nasopharyngeal Swab     Status: None   Collection Time: 08/25/21 10:50 PM   Specimen: Nasopharyngeal Swab; Nasopharyngeal(NP) swabs in vial transport medium  Result Value Ref Range Status   SARS Coronavirus 2 by RT PCR NEGATIVE NEGATIVE Final         Influenza A by PCR NEGATIVE NEGATIVE Final   Influenza B by PCR NEGATIVE NEGATIVE Final           _______________________________________________ Hospitalist was called for admission for A. fib with RVR fluid overload in the setting of noncompliance with HD  The following Work up has been ordered so far:  Orders Placed This Encounter  Procedures   Blood culture (routine x 2)   Resp Panel by RT-PCR (Flu A&B, Covid) Nasopharyngeal Swab   DG Chest Port 1 View   Basic metabolic panel   CBC   Hepatic function panel   Magnesium   Brain natriuretic peptide   Urinalysis, Routine w reflex microscopic   Lactic acid, plasma   Brain natriuretic peptide   CK   Document Height and Actual Weight   If O2 sat   Informed Consent Details: Physician/Practitioner Attestation; Transcribe to consent form and obtain patient signature   Pre-Hemodialysis Protocol - Day of Dialysis   Post-Dialysis Protocol - Day of Dialysis   Re-check Vital Signs   Cardiac monitoring    Consult to nephrology   Consult to hospitalist   Nutritional services consult   I-stat chem 8, ED (not at [facility] or [facility])   ED EKG   EKG 12-Lead   EKG 12-Lead   Hemodialysis inpatient   Admit to Inpatient (patient's expected length of stay will be greater than 2 midnights or inpatient only procedure)     OTHER Significant initial  Findings:  labs showing:    Recent Labs  Lab 09/07/21 1723 09/07/21 1741 09/07/21 1750  NA  --  139 133*  K  --  7.0* 7.0*  CO2  --  25  --   GLUCOSE  --  78 79  BUN  --  46* 68*  CREATININE  --  6.29* 6.30*  CALCIUM  --  10.4*  --   MG 2.7*  --   --     Cr  stable,    Lab Results  Component Value Date   CREATININE 6.30 (H) 09/07/2021   CREATININE 6.29 (H) 09/07/2021   CREATININE 4.30 (H) 08/28/2021    Recent Labs  Lab 09/07/21 1723  AST 22  ALT 18  ALKPHOS 97  BILITOT 1.3*  PROT 7.3  ALBUMIN 3.2*   Lab Results  Component Value Date   CALCIUM 10.4 (H) 09/07/2021   PHOS 5.5 (H) 08/28/2021  Plt: Lab Results  Component Value Date   PLT 151 09/07/2021       COVID-19 Labs  No results for input(s): DDIMER, FERRITIN, LDH, CRP in the last 72 hours.  Lab Results  Component Value Date   SARSCOV2NAA NEGATIVE 08/25/2021   SARSCOV2NAA NEGATIVE 08/12/2021   SARSCOV2NAA NEGATIVE 07/16/2021   [place] NEGATIVE 07/07/2021         Recent Labs  Lab 09/07/21 1741 09/07/21 1750  WBC 5.3  --   HGB 9.9* 11.2*  HCT 32.7* 33.0*  MCV 108.6*  --   PLT 151  --     HG/HCT stable,      Component Value Date/Time   HGB 11.2 (L) 09/07/2021 1750   HCT 33.0 (L) 09/07/2021 1750   HCT 27.3 (L) 05/17/2019 0203   MCV 108.6 (H) 09/07/2021 1741       BNP (last 3 results) Recent Labs    08/12/21 0153 08/25/21 1930 09/07/21 1723  BNP >4,500.0* >4,500.0* 4,047.2*      DM  labs:  HbA1C: Recent Labs    07/01/21 1331  HGBA1C 4.6*       CBG (last 3)  No results for input(s): GLUCAP in the last 72 hours.         Cultures:    Component Value Date/Time   SDES BLOOD LEFT ARM 07/08/2021 0619   SPECREQUEST  07/08/2021 0619    BOTTLES DRAWN AEROBIC AND ANAEROBIC Blood Culture adequate volume   CULT  07/08/2021 0619    NO GROWTH 5 DAYS Performed at [facility], [place] [address] [place], [place] [address]    REPTSTATUS 07/13/2021 FINAL 07/08/2021 7322     Radiological Exams on Admission: DG Chest Port 1 View  Result Date: 09/07/2021 CLINICAL DATA:  Weakness and shortness of breath. EXAM: PORTABLE CHEST 1 VIEW COMPARISON:  Most recent radiograph 08/28/2021. Chest CTA 08/26/2021 FINDINGS: Right-sided dialysis catheter unchanged in position. Cardiomegaly is stable. Increasing right pleural effusion and basilar atelectasis. Increasing pulmonary edema. No pneumothorax. Stable osseous structures. IMPRESSION: 1. Increasing right pleural effusion and basilar atelectasis. 2. Increasing pulmonary edema. Electronically Signed   By: Keith Rake M.D.   On: 09/07/2021 17:58   _______________________________________________________________________________________________________ Latest  Blood pressure 111/68, pulse 84, temperature 98.1 F (36.7 C), temperature source Oral, resp. rate (!) 26, SpO2 (!) 83 %.   Vitals  labs and radiology finding personally reviewed  Review of Systems:    Pertinent positives include:  Fevers, chills, fatigue,  Constitutional:  No weight loss, night sweats,  weight loss  HEENT:  No headaches, Difficulty swallowing,Tooth/dental problems,Sore throat,  No sneezing, itching, ear ache, nasal congestion, post nasal drip,  Cardio-vascular:  No chest pain, Orthopnea, PND, anasarca, dizziness, palpitations.no Bilateral lower extremity swelling  GI:  No heartburn, indigestion, abdominal pain, nausea, vomiting, diarrhea, change in bowel habits, loss of appetite, melena, blood in stool, hematemesis Resp:  no shortness of breath at rest. No dyspnea on exertion, No excess mucus, no  productive cough, No non-productive cough, No coughing up of blood.No change in color of mucus.No wheezing. Skin:  no rash or lesions. No jaundice GU:  no dysuria, change in color of urine, no urgency or frequency. No straining to urinate.  No flank pain.  Musculoskeletal:  No joint pain or no joint swelling. No decreased range of motion. No back pain.  Psych:  No change in mood or affect. No depression or anxiety. No memory loss.  Neuro: no localizing neurological complaints, no tingling, no  weakness, no double vision, no gait abnormality, no slurred speech, no confusion  All systems reviewed and apart from [place] all are negative _______________________________________________________________________________________________ Past Medical History:   Past Medical History:  Diagnosis Date   Anaphylactic shock, unspecified, sequela 06/10/2019   Aortic insufficiency    Aortic stenosis    Arthritis    Asthma, chronic, unspecified asthma severity, with acute exacerbation 10/23/2017   CAP (community acquired pneumonia) 10/23/2017   Carpal tunnel syndrome of right wrist 10/05/2018   Cataract    right - removed by surgery   Cerebral infarction due to thrombosis of cerebral artery (HCC)    Cervical disc herniation 01/04/2020   Chronic diastolic heart failure ([place]) 04/13/2018   Chronic low back pain 11/24/2019   Constipation    COPD (chronic obstructive pulmonary disease) (HCC)    Cough    chronic cough   Encounter for immunization 07/08/2017   ESRD on hemodialysis ([place]) 02/16/2018   Fall 06/04/2019   GERD (gastroesophageal reflux disease) 06/04/2019   GI bleeding 05/24/2017   Gram-negative sepsis, unspecified ([place]) 09/05/2017   History of fusion of cervical spine 03/26/2020   Hyperlipidemia    Hypertension    Hypokalemia 06/04/2017   Hypotension    Iron deficiency anemia, unspecified 09/09/2017   Left shoulder pain 11/11/2019   Lumbar radiculopathy 11/24/2019   Lung contusion  06/04/2019   LVH (left ventricular hypertrophy) due to hypertensive disease, with heart failure ([place]) 06/18/2020   Macrocytic anemia 05/24/2017   Mitral regurgitation    Moderate protein-calorie malnutrition ([place]) 06/06/2017   Myofascial pain syndrome 06/12/2020   Neuritis of right ulnar nerve 09/13/2018   Non-compliance with renal dialysis ([place]) 02/08/2020   Oxygen deficiency 12/30/2019   O2 sats on RA 87% at PAT appt    Pain in joint of right elbow 09/13/2018   Paroxysmal atrial flutter (HCC)    PEA (Pulseless electrical activity) ([place])    after sedation in 07/2021   Pulmonary hypertension ([place])    Renovascular hypertension 06/22/2020   Restless leg syndrome    Rib fractures 06/2019   Right   S/P cardiac cath 08/27/2020   normal coronary arteries.   Secondary hyperparathyroidism of renal origin ([place]) 06/04/2017   Thoracic ascending aortic aneurysm    Tricuspid regurgitation    Ulnar neuropathy 10/05/2018   Volume overload 10/23/2017     Past Surgical History:  Procedure Laterality Date   A/V FISTULAGRAM N/A 01/01/2021   Procedure: A/V FISTULAGRAM;  Surgeon: Serafina Mitchell, MD;  Location: [facility];  Service: Cardiovascular;  Laterality: N/A;   A/V FISTULAGRAM N/A 01/15/2021   Procedure: A/V [id number];  Surgeon: Serafina Mitchell, MD;  Location: [place] [facility];  Service: Cardiovascular;  Laterality: N/A;   ANTERIOR CERVICAL DECOMP/DISCECTOMY FUSION N/A 01/04/2020   Procedure: ANTERIOR CERVICAL DECOMPRESSION/DISCECTOMY FUSION CERVICAL FIVE THROUGH SEVEN;  Surgeon: Melina Schools, MD;  Location: [facility];  Service: Orthopedics;  Laterality: N/A;  3 hrs   AV FISTULA PLACEMENT Left 05/28/2017   Procedure: LEFT ARM ARTERIOVENOUS (AV) FISTULA CREATION;  Surgeon: Conrad , MD;  Location: [place];  Service: Vascular;  Laterality: Left;   AV FISTULA PLACEMENT Left 02/02/2021   Procedure: Debride left forearm, ligation of left upper arm Aretiovenous fistula;  Surgeon: Elam Dutch, MD;  Location: [facility] OR;  Service: Vascular;  Laterality: Left;   AV FISTULA PLACEMENT Right 04/04/2021   Procedure: RIGHT ARTERIOVENOUS (AV) FISTULA CREATION;  Surgeon: Serafina Mitchell, MD;  Location: [place];  Service: Vascular;  Laterality: Right;   BIOPSY  07/10/2021   Procedure: BIOPSY;  Surgeon: Lavena Bullion, DO;  Location: [facility];  Service: Gastroenterology;;   COLONOSCOPY WITH PROPOFOL N/A 07/10/2021   Procedure: COLONOSCOPY WITH PROPOFOL;  Surgeon: Lavena Bullion, DO;  Location: [facility];  Service: Gastroenterology;  Laterality: N/A;   ESOPHAGOGASTRODUODENOSCOPY (EGD) WITH PROPOFOL N/A 07/10/2021   Procedure: ESOPHAGOGASTRODUODENOSCOPY (EGD) WITH PROPOFOL;  Surgeon: Lavena Bullion, DO;  Location: [place];  Service: Gastroenterology;  Laterality: N/A;   EYE SURGERY Right 06/02/2019   Cataract removed   FRACTURE SURGERY     INSERTION OF DIALYSIS CATHETER Right 02/02/2021   Procedure: INSERTION OF Right Internal jugular TUNNELED  DIALYSIS CATHETER;  Surgeon: Elam Dutch, MD;  Location: [place];  Service: Vascular;  Laterality: Right;   IR DIALY SHUNT INTRO NEEDLE/INTRACATH INITIAL W/IMG LEFT Left 09/12/2020   IR FLUORO GUIDE CV LINE RIGHT  05/25/2017   IR FLUORO GUIDE CV LINE RIGHT  07/17/2021   IR US GUIDE VASC ACCESS LEFT  09/12/2020   IR US GUIDE VASC ACCESS RIGHT  05/25/2017   IR US GUIDE VASC ACCESS RIGHT  07/17/2021   LIGATION OF ARTERIOVENOUS  FISTULA Left 01/10/2021   Procedure: LIGATION OF LEFT ARM RADIOCEPHALIC FISTULA;  Surgeon: Serafina Mitchell, MD;  Location[facility];  Service: Vascular;  Laterality: Left;   PERIPHERAL VASCULAR BALLOON ANGIOPLASTY Left 01/15/2021   Procedure: PERIPHERAL VASCULAR BALLOON ANGIOPLASTY;  Surgeon: Serafina Mitchell, MD;  Location: [facility];  Service: Cardiovascular;  Laterality: Left;  AVF   REVISON OF ARTERIOVENOUS FISTULA Left 07/18/2019   Procedure: REVISION PLICATION OF RADIOCEPHALIC ARTERIOVENOUS FISTULA  LEFT ARM;  Surgeon: Angelia Mould, MD;  Location: [facility];  Service: Vascular;  Laterality: Left;   REVISON OF ARTERIOVENOUS FISTULA Left 01/10/2021   Procedure: CONVERSION TO BRACHIOCEPHALIC ARTERIOVENOUS FISTULA;  Surgeon: Serafina Mitchell, MD;  Location: [facility];  Service: Vascular;  Laterality: Left;   RINOPLASTY      Social History:  Ambulatory   independently       reports that he has never smoked. He has never used smokeless tobacco. He reports that he does not currently use alcohol. He reports that he does not use drugs.     Family History:   Family History  Problem Relation Age of Onset   Cancer Mother    ______________________________________________________________________________________________ Allergies: Allergies  Allergen Reactions   Vancomycin Shortness Of Breath and Itching     Prior to Admission medications   Medication Sig Start Date End Date Taking? Authorizing Provider  albuterol (VENTOLIN HFA) 108 (90 Base) MCG/ACT inhaler Inhale 2 puffs into the lungs 2 (two) times daily as needed for shortness of breath or wheezing. Patient not taking: Reported on 08/11/2021 06/01/21   Oswald Hillock, MD  apixaban (ELIQUIS) 2.5 MG TABS tablet Take 1 tablet (2.5 mg total) by mouth 2 (two) times daily. Patient not taking: Reported on 07/16/2021 07/13/21 08/12/21  Darliss Cheney, MD  cinacalcet (SENSIPAR) 60 MG tablet Take 1 tablet (60 mg total) by mouth every evening. Patient not taking: Reported on 07/16/2021 06/01/21   Oswald Hillock, MD  metoprolol tartrate (LOPRESSOR) 50 MG tablet Take 1 tablet (50 mg total) by mouth 2 (two) times daily. 06/01/21   Oswald Hillock, MD  multivitamin (RENA-VIT) TABS tablet Take 1 tablet by mouth at bedtime. 01/12/18   [provider]  sevelamer carbonate (RENVELA) 800 MG tablet Take 800-1,600 mg by mouth  See admin instructions. Take 1-2 tablets ([phone] mg) by mouth three times daily with meals, take 1 tablet (800 mg) with snacks  05/31/21   [provider]    ___________________________________________________________________________________________________ Physical Exam: Vitals with BMI 09/07/2021 09/07/2021 09/07/2021  Height - - -  Weight - - -  BMI - - -  Systolic [phone]  Diastolic 68 67 88  Pulse 84 75 99      1. General:  in No  Acute distress    Chronically ill  -appearing 2. Psychological: Alert and  Oriented 3. Head/ENT:   Moist   Mucous Membranes                          Head Non traumatic, neck supple                           Poor Dentition 4. SKIN: decreased Skin turgor,  Skin clean Dry and intact no rash 5. Heart: Regular rate and rhythm no  Murmur, no Rub or gallop 6. Lungs  no wheezes some  crackles   7. Abdomen: Soft,  non-tender, Non distended  bowel sounds present 8. Lower extremities: no clubbing, cyanosis,2+ edema 9. Neurologically Grossly intact, moving all 4 extremities equally   10. MSK: Normal range of motion    Chart has been reviewed  ______________________________________________________________________________________________  Assessment/Plan 61 y.o. male with medical history significant of ESRD on HD t H s, Diastolic CHF,  Atrial fibrillation, on chronic oxygen 2.5 L, COPD  Admitted for acute on chronic diastolic CHF in the  setting of fluid overload due to noncompliance with HD  Present on Admission:  Hyperkalemia  Acute on chronic diastolic (congestive) heart failure (HCC)  Anemia of chronic disease  Atrial fibrillation with RVR (HCC)  Elevated troponin  Chronic respiratory failure with hypoxia (HCC)  Fever  Hypotension  End stage renal disease (HCC)     Hyperkalemia In the setting of noncompliance with hemodialysis nephrology is aware plan for hemodialysis tonight follow labs afterwards.  Was treated in the ER already with [facility]  Acute on chronic diastolic (congestive) heart failure ([place]) In the setting of noncompliance with hemodialysis will  dialyze tonight and continue nephrology is aware.  Patient has had recent echogram.  We will hold off.  Dialysis dependent currently  Anemia of chronic disease Chronic stable continue to follow resume home medications when able to tolerate  Atrial fibrillation with RVR ([place]) Initially treated with a diltiazem drip.  Currently we will hold to allow blood pressure for hemodialysis.  Maintain maps above 85 After hemodialysis completed could try to resume metoprolol if able to  Tolerate Continue Eliquis  Elevated troponin  -    no EKG changes in the setting of  increased work of breathing and  chronic kidney disease likely due to demand ischemia and poor clearance, monitor on telemetry and cycle cardiac enzymes to trend.  if continues to rise will need further work-up   Chronic respiratory failure with hypoxia ([place]) On baseline on 2.5 litters  Fever Reported subjective fever at home none her so far no evidence of infection cont to follow   Hypotension Has propensity for Hypotension at times needed milrinone for BP support Hold metoprolol prior to HD Will reassess if able to restart    End stage renal disease [facility]) Nephrology is aware plant to HD tonight  IV Albumin as needed. Plan for next HD Monday.  Keep SBP >85. Closely monitoring of symptoms.    Other plan as per orders.  DVT prophylaxis: eliquis    Code Status:    Code Status: Prior FULL CODE care as per patient   I had personally discussed CODE STATUS with patient       Family Communication:   Family not at  Bedside    Disposition Plan:    To home once workup is complete and patient is stable   Following barriers for discharge:                            Electrolytes corrected                                                        Will need consultants to evaluate patient prior to discharge                        Would benefit from PT/OT eval prior to DC  Ordered                                         Consults called:  nephrology , may need cardiology consult in AM  Admission status:  ED Disposition     ED Disposition  [place]: [place] [[id number]]  Level of Care: Progressive [102]  Admit to Progressive based on following criteria: NEPHROLOGY stable condition requiring close monitoring for AKI, requiring Hemodialysis or Peritoneal Dialysis either from expected electrolyte imbalance, acidosis, or fluid overload that can be managed by NIPPV or high flow oxygen.  May admit patient to [facility] or [facility] if equivalent level of care is available:: No  Covid Evaluation: Symptomatic Person Under Investigation (PUI)  Diagnosis: Hyperkalemia [[id number]]  Admitting Physician: Toy Baker [[id number]]  Attending Physician: Toy Baker [[id number]]  Estimated length of stay: past midnight tomorrow  Certification:: I certify this patient will need inpatient services for at least 2 midnights           inpatient     I Expect 2 midnight stay secondary to severity of patient's current illness need for inpatient interventions justified by the following:  hemodynamic instability despite optimal treatment (tachycardia  ) * Severe lab/radiological/exam abnormalities including:    Fluid overload, hypekalemia and extensive comorbidities including:    CHF    COPD/asthma   ESRD   Chronic anticoagulation  That are currently affecting medical management.   I expect  patient to be hospitalized for 2 midnights requiring inpatient medical care.  Patient is at high risk for adverse outcome (such as loss of life or disability) if not treated.  Indication for inpatient stay as follows:    Need for operative/procedural  intervention New or worsening hypoxia   Need for HD dialysis    Level of care        progressive tele indefinitely please discontinue once patient no longer qualifies COVID-19 Labs    Lab Results   Component Value Date   Morrison 08/25/2021     Precautions: admitted as   PUI   No active isolations  If Covid PCR is negative  - please DC precautions - would need additional investigation given very high risk for false native test result    Thomas Robinson 09/08/2021, 12:18 AM    [facility]     after 2 AM please page floor coverage PA If 7AM-7PM, please contact the day team taking care of the patient using [email or web address]   Patient was evaluated in the context of the global COVID-19 pandemic, which necessitated consideration that the patient might be at risk for infection with the SARS-CoV-2 virus that causes COVID-19. Institutional protocols and algorithms that pertain to the evaluation of patients at risk for COVID-19 are in a state of rapid change based on information released by regulatory bodies including the CDC and federal and state organizations. These policies and algorithms were followed during the patient's care.

## 2021-09-07 NOTE — Assessment & Plan Note (Signed)
Has propensity for Hypotension at times needed milrinone for BP support Hold metoprolol prior to HD Will reassess if able to restart

## 2021-09-07 NOTE — Assessment & Plan Note (Signed)
Chronic stable continue to follow resume home medications when able to tolerate

## 2021-09-07 NOTE — Assessment & Plan Note (Signed)
Reported subjective fever at home none her so far no evidence of infection cont to follow

## 2021-09-08 LAB — COMPREHENSIVE METABOLIC PANEL
ALT: 18 U/L (ref 0–44)
AST: 18 U/L (ref 15–41)
Albumin: 3.2 g/dL — ABNORMAL LOW (ref 3.5–5.0)
Alkaline Phosphatase: 104 U/L (ref 38–126)
Anion gap: 12 (ref 5–15)
BUN: 21 mg/dL (ref 8–23)
CO2: 28 mmol/L (ref 22–32)
Calcium: 10 mg/dL (ref 8.9–10.3)
Chloride: 98 mmol/L (ref 98–111)
Creatinine, Ser: 3.78 mg/dL — ABNORMAL HIGH (ref 0.61–1.24)
GFR, Estimated: 17 mL/min — ABNORMAL LOW (ref >60)
Glucose, Bld: 94 mg/dL (ref 70–99)
Potassium: 4.1 mmol/L (ref 3.5–5.1)
Sodium: 138 mmol/L (ref 135–145)
Total Bilirubin: 1.3 mg/dL — ABNORMAL HIGH (ref 0.3–1.2)
Total Protein: 7.1 g/dL (ref 6.5–8.1)

## 2021-09-08 LAB — MAGNESIUM: Magnesium: 2.2 mg/dL (ref 1.7–2.4)

## 2021-09-08 LAB — HEPATITIS B SURFACE ANTIGEN: Hepatitis B Surface Ag: NONREACTIVE

## 2021-09-08 LAB — CBC WITH DIFFERENTIAL/PLATELET
Abs Immature Granulocytes: 0.01 10*3/uL (ref 0.00–0.07)
Basophils Absolute: 0.1 10*3/uL (ref 0.0–0.1)
Basophils Relative: 1 %
Eosinophils Absolute: 0.4 10*3/uL (ref 0.0–0.5)
Eosinophils Relative: 8 %
HCT: 32.2 % — ABNORMAL LOW (ref 39.0–52.0)
Hemoglobin: 10.1 g/dL — ABNORMAL LOW (ref 13.0–17.0)
Immature Granulocytes: 0 %
Lymphocytes Relative: 13 %
Lymphs Abs: 0.7 10*3/uL (ref 0.7–4.0)
MCH: 33.7 pg (ref 26.0–34.0)
MCHC: 31.4 g/dL (ref 30.0–36.0)
MCV: 107.3 fL — ABNORMAL HIGH (ref 80.0–100.0)
Monocytes Absolute: 0.3 10*3/uL (ref 0.1–1.0)
Monocytes Relative: 6 %
Neutro Abs: 3.6 10*3/uL (ref 1.7–7.7)
Neutrophils Relative %: 72 %
Platelets: 158 10*3/uL (ref 150–400)
RBC: 3 MIL/uL — ABNORMAL LOW (ref 4.22–5.81)
RDW: 17.3 % — ABNORMAL HIGH (ref 11.5–15.5)
WBC: 5.1 10*3/uL (ref 4.0–10.5)
nRBC: 0 % (ref 0.0–0.2)

## 2021-09-08 LAB — PHOSPHORUS: Phosphorus: 4.4 mg/dL (ref 2.5–4.6)

## 2021-09-08 LAB — CK: Total CK: 17 U/L — ABNORMAL LOW (ref 49–397)

## 2021-09-08 LAB — HEPATITIS B SURFACE ANTIBODY,QUALITATIVE: Hep B S Ab: NONREACTIVE

## 2021-09-08 LAB — BRAIN NATRIURETIC PEPTIDE: B Natriuretic Peptide: 4138.6 pg/mL — ABNORMAL HIGH (ref 0.0–100.0)

## 2021-09-08 LAB — TSH: TSH: 7.75 u[IU]/mL — ABNORMAL HIGH (ref 0.350–4.500)

## 2021-09-08 LAB — LACTIC ACID, PLASMA: Lactic Acid, Venous: 2.4 mmol/L (ref 0.5–1.9)

## 2021-09-08 LAB — TROPONIN I (HIGH SENSITIVITY): Troponin I (High Sensitivity): 80 ng/L — ABNORMAL HIGH (ref <18)

## 2021-09-08 MED ORDER — SODIUM CHLORIDE 0.9% FLUSH
3.0000 mL | Freq: Two times a day (BID) | INTRAVENOUS | Status: DC
  Administered 2021-09-08 – 2021-09-11 (×7): 3 mL via INTRAVENOUS

## 2021-09-08 MED ORDER — HYDROCODONE-ACETAMINOPHEN 5-325 MG PO TABS
1.0000 | ORAL_TABLET | ORAL | Status: DC | PRN
  Administered 2021-09-08 – 2021-09-12 (×10): 2 via ORAL
  Filled 2021-09-08 (×10): qty 2

## 2021-09-08 MED ORDER — SEVELAMER CARBONATE 800 MG PO TABS: 800.0000 mg | ORAL_TABLET | Freq: Two times a day (BID) | ORAL | Status: DC | PRN

## 2021-09-08 MED ORDER — CHLORHEXIDINE GLUCONATE CLOTH 2 % EX PADS: 6.0000 | MEDICATED_PAD | Freq: Every day | CUTANEOUS | Status: DC

## 2021-09-08 MED ORDER — SEVELAMER CARBONATE 800 MG PO TABS
1600.0000 mg | ORAL_TABLET | Freq: Three times a day (TID) | ORAL | Status: DC
  Administered 2021-09-08 – 2021-09-12 (×12): 1600 mg via ORAL
  Filled 2021-09-08 (×12): qty 2

## 2021-09-08 MED ORDER — SODIUM CHLORIDE 0.9 % IV SOLN: 250.0000 mL | INTRAVENOUS | Status: DC | PRN

## 2021-09-08 MED ORDER — GUAIFENESIN ER 600 MG PO TB12
600.0000 mg | ORAL_TABLET | Freq: Two times a day (BID) | ORAL | Status: DC
  Administered 2021-09-08 – 2021-09-09 (×2): 600 mg via ORAL
  Filled 2021-09-08 (×2): qty 1

## 2021-09-08 MED ORDER — APIXABAN 2.5 MG PO TABS
2.5000 mg | ORAL_TABLET | Freq: Two times a day (BID) | ORAL | Status: DC
  Administered 2021-09-08 – 2021-09-13 (×11): 2.5 mg via ORAL
  Filled 2021-09-08 (×11): qty 1

## 2021-09-08 MED ORDER — SODIUM CHLORIDE 0.9% FLUSH
3.0000 mL | INTRAVENOUS | Status: DC | PRN
  Administered 2021-09-11: 3 mL via INTRAVENOUS

## 2021-09-08 MED ORDER — ALBUTEROL SULFATE (2.5 MG/3ML) 0.083% IN NEBU: 2.5000 mg | INHALATION_SOLUTION | RESPIRATORY_TRACT | Status: DC | PRN

## 2021-09-08 MED ORDER — ACETAMINOPHEN 325 MG PO TABS: 650.0000 mg | ORAL_TABLET | Freq: Four times a day (QID) | ORAL | Status: DC | PRN

## 2021-09-08 MED ORDER — ACETAMINOPHEN 650 MG RE SUPP: 650.0000 mg | Freq: Four times a day (QID) | RECTAL | Status: DC | PRN

## 2021-09-08 NOTE — Progress Notes (Signed)
Patient arrived to floor from HD.  VS taken.    Patient is in A fib.    A x O x4.   Has an AV graft in the Left arm that they are not able to use anymore.  Has an AV graft to the right arm that is still harvesting.  Good trill and bruit  Has a right chest HD cath that is used for HD  Patient oriented to room.  Call bell placed in patient's reach.  Bed in lowest position.

## 2021-09-08 NOTE — Evaluation (Signed)
Physical Therapy Evaluation Patient Details Name: Thomas Robinson MRN: [id number] DOB: 1961/03/11 Today's Date: 09/08/2021  History of Present Illness  Pt is a 61 y.o. male admitted 08/25/21 with SOB. Workup for afib with RVR, CHF exacerbation, pleural effusion. PMH includes ESRD (HD MWF), CHF, afib, TAA, COPD, asthma; of note, multiple recent admissions   Clinical Impression  Pt admitted with above diagnosis. PTA pt lived at home with friend/roommate providing assist with mobility and ADLs as needed. He reports using rollator for very minimal ambulation in the home. He is on 2.5L O2 at baseline.  Pt currently with functional limitations due to the deficits listed below (see PT Problem List). On eval, pt required supervision bed mobility. Pt refusing OOB due to fatigue and BLE pain. Moderate edema noted BLE distally. Pt instructed in ankle pumps. SpO2 in upper 80s on 3L, and frequent coughing. Pt will benefit from skilled PT to increase their independence and safety with mobility to allow discharge to the venue listed below.  HHPT recommended during most recent hospitalization (discharged 12/29), but pt reports he has not received HHPT. Pt unsure if the [facility] called or not.        Recommendations for follow up therapy are one component of a multi-disciplinary discharge planning process, led by the attending physician.  Recommendations may be updated based on patient status, additional functional criteria and insurance authorization.  Follow Up Recommendations Home health PT    Assistance Recommended at Discharge Frequent or constant Supervision/Assistance  Patient can return home with the following  A little help with walking and/or transfers;Assistance with cooking/housework;Assist for transportation;A little help with bathing/dressing/bathroom;Help with stairs or ramp for entrance    Equipment Recommendations None recommended by PT  Recommendations for Other Services       Functional  Status Assessment Patient has had a recent decline in their functional status and/or demonstrates limited ability to make significant improvements in function in a reasonable and predictable amount of time     Precautions / Restrictions Precautions Precautions: Fall;Other (comment) Precaution Comments: Wears 2.5L O2 baseline Restrictions Weight Bearing Restrictions: No      Mobility  Bed Mobility Overal bed mobility: Needs Assistance Bed Mobility: Supine to Sit;Sit to Supine     Supine to sit: Supervision;HOB elevated Sit to supine: Supervision;HOB elevated   General bed mobility comments: +rail, assist for safety    Transfers Overall transfer level: Needs assistance                 General transfer comment: refused OOB    Ambulation/Gait                  Stairs            Wheelchair Mobility    Modified Rankin (Stroke Patients Only)       Balance Overall balance assessment: Needs assistance                                           Pertinent Vitals/Pain Pain Assessment: Faces Faces Pain Scale: Hurts even more Pain Location: back and bilat feet Pain Descriptors / Indicators: Pins and needles;Squeezing;Discomfort Pain Intervention(s): Monitored during session;Limited activity within patient's tolerance    Home Living Family/patient expects to be discharged to:: Private residence Living Arrangements: Non-relatives/Friends Available Help at Discharge: Friend(s);Available 24 hours/day Type of Home: House Home Access: Stairs to enter Entrance Stairs-Rails:  None Entrance Stairs-Number of Steps: 2   Home Layout: One level Home Equipment: Rollator (4 wheels);Cane - single point;Shower seat;Wheelchair - manual Additional Comments: pt reports good friends live with him and provides as much assist as needed    Prior Function Prior Level of Function : Needs assist       Physical Assist : Mobility (physical);ADLs  (physical)     Mobility Comments: limited ambulator with rollator, states his friend currently does not let him drive ADLs Comments: reports using shower chair and having assist from friend for all ADLs     Hand Dominance   Dominant Hand: Right    Extremity/Trunk Assessment   Upper Extremity Assessment Upper Extremity Assessment: Defer to OT evaluation LUE Deficits / Details: baseline weakness from hx of stroke LUE Coordination: decreased fine motor;decreased gross motor    Lower Extremity Assessment Lower Extremity Assessment: Generalized weakness RLE Deficits / Details: moderate edema noted distally LLE Deficits / Details: moderate edema noted distally    Cervical / Trunk Assessment Cervical / Trunk Assessment: Kyphotic  Communication   Communication: No difficulties  Cognition Arousal/Alertness: Awake/alert Behavior During Therapy: WFL for tasks assessed/performed Overall Cognitive Status: Within Functional Limits for tasks assessed                                          General Comments General comments (skin integrity, edema, etc.): SpO2 in upper 80s on 3L at rest and with minimal bed mobility. HR in 80s    Exercises General Exercises - Lower Extremity Ankle Circles/Pumps: AROM;Both;20 reps;Supine   Assessment/Plan    PT Assessment Patient needs continued PT services  PT Problem List Decreased strength;Decreased balance;Decreased mobility;Decreased activity tolerance;Decreased knowledge of use of DME;Decreased knowledge of precautions;Cardiopulmonary status limiting activity;Pain       PT Treatment Interventions DME instruction;Functional mobility training;Balance training;Patient/family education;Therapeutic activities;Gait training;Therapeutic exercise    PT Goals (Current goals can be found in the Care Plan section)  Acute Rehab PT Goals Patient Stated Goal: return home with help from roommate/friend PT Goal Formulation: With  patient Time For Goal Achievement: 09/22/21 Potential to Achieve Goals: Good    Frequency Min 2X/week     Co-evaluation               AM-PAC PT "6 Clicks" Mobility  Outcome Measure Help needed turning from your back to your side while in a flat bed without using bedrails?: None Help needed moving from lying on your back to sitting on the side of a flat bed without using bedrails?: A Little Help needed moving to and from a bed to a chair (including a wheelchair)?: A Little Help needed standing up from a chair using your arms (e.g., wheelchair or bedside chair)?: A Little Help needed to walk in hospital room?: A Lot Help needed climbing 3-5 steps with a railing? : A Lot 6 Click Score: 17    End of Session Equipment Utilized During Treatment: Oxygen Activity Tolerance: Patient limited by fatigue;Patient limited by pain Patient left: in bed;with call bell/phone within reach Nurse Communication: Mobility status PT Visit Diagnosis: Muscle weakness (generalized) (M62.81);Other abnormalities of gait and mobility (R26.89);Pain    Time: 0932-6712 PT Time Calculation (min) (ACUTE ONLY): 16 min   Charges:   PT Evaluation $PT Eval Moderate Complexity: 1 Mod          Lorrin Goodell, PT  Office # [phone]  Pager [phone]   Lorriane Shire 09/08/2021, 2:25 PM

## 2021-09-08 NOTE — Progress Notes (Signed)
[facility] Progress Note   61 y.o. male HTN, dCHF, CVA, ascending aortic aneurysm, PEA arrest 07/2021, aflutter, COPD on 2.5L O2 at home, ESRD with history of severe Vian. He's had many admissions for hypoxia in the setting of shortened or missed dialysis treatments. Last  treatments in the hospital were on 12/27 and 12/28; last outpatient treatments at [facility] were on 1/2 and 1/5 signing off early each time. 2hr74min and 2hr 77min, EDW appears to be 51.5-52 kg and he did leave above his EDW last treatment. Here w/ generalized aching, chest pain and whole body itching.  Dialysis Orders:  MWF - [place] 3.5h  400/500  51.5 kg  2/2 bath  RIJ TDC/  L AVF unusable - mircera 150 ug q2 last 12/21 -venofer 100mg  IV weekly  Assessment/ Plan:   ESRD - on HD MWF; patient continuous non-compliance with HD-leaves txs early in outpatient. Tolerated HD on 1/7 but did have sharp chest pain; he never completes a full treatment at outpt center. - Planning on next HD tomorrow; no absolute indication today. Will try to limit UF to get him through a full treatment.  IV Albumin as needed.   Hypertension/volume  - Appears to be much more comfortable breathing today but is still on O2.  Anemia of CKD - Hgb 11.2; ESA not due yet; monitor trends Secondary Hyperparathyroidism - PO4 high-continue binders Hypoxemic Respiratory Failure 2nd Volume Overload/Acute on Chronic CHF-plan for HD; last ECHO 08/12/21 EF 60-65% Nutrition - Renal diet with fluid restriction GOC-Patient with multiple co-morbidities and ongoing non-compliance with HD.   Subjective:   Feeling better today with less dyspnea, able to speak in full sentences and no further CP. He was tachycardic overnight and I wonder if that's when he had symptoms of CP   Objective:   BP (!) 141/94 (BP Location: Left Arm)    Pulse 98    Temp 98.5 F (36.9 C) (Oral)    Resp (!) 24    Ht 5\' 8"  (1.727 m)    SpO2 94%    BMI 17.06 kg/m    Intake/Output Summary (Last 24 hours) at 09/08/2021 1007 Last data filed at 09/08/2021 0856 Gross per 24 hour  Intake 621.48 ml  Output --  Net 621.48 ml   Weight change:   Physical Exam: Gen: Alert, nad  Neck: No jvd Chest: cta bilat, no rales, dec'd R base Cor: irreg irreg  Abd: soft ntnd no ascites, not tachy  Ext: no LE edema Neuro: Alert, NF, ox3 Access:  RIJ TDC / RUA BBF + pulse  Imaging: DG Chest Port 1 View  Result Date: 09/07/2021 CLINICAL DATA:  Weakness and shortness of breath. EXAM: PORTABLE CHEST 1 VIEW COMPARISON:  Most recent radiograph 08/28/2021. Chest CTA 08/26/2021 FINDINGS: Right-sided dialysis catheter unchanged in position. Cardiomegaly is stable. Increasing right pleural effusion and basilar atelectasis. Increasing pulmonary edema. No pneumothorax. Stable osseous structures. IMPRESSION: 1. Increasing right pleural effusion and basilar atelectasis. 2. Increasing pulmonary edema. Electronically Signed   By: Keith Rake M.D.   On: 09/07/2021 17:58    Labs: BMET Recent Labs  Lab 09/07/21 1741 09/07/21 1750 09/08/21 0214  NA 139 133* 138  K 7.0* 7.0* 4.1  CL 97* 101 98  CO2 25  --  28  GLUCOSE 78 79 94  BUN 46* 68* 21  CREATININE 6.29* 6.30* 3.78*  CALCIUM 10.4*  --  10.0  PHOS  --   --  4.4   CBC Recent  Labs  Lab 09/07/21 1741 09/07/21 1750 09/08/21 0214  WBC 5.3  --  5.1  NEUTROABS  --   --  3.6  HGB 9.9* 11.2* 10.1*  HCT 32.7* 33.0* 32.2*  MCV 108.6*  --  107.3*  PLT 151  --  158    Medications:     apixaban  2.5 mg Oral BID   Chlorhexidine Gluconate Cloth  6 each Topical Q0600   sevelamer carbonate  1,600 mg Oral TID WC   sodium chloride flush  3 mL Intravenous Q12H      Otelia Santee, MD 09/08/2021, 10:07 AM

## 2021-09-08 NOTE — Evaluation (Signed)
Occupational Therapy Evaluation Patient Details Name: Thomas Robinson MRN: [id number] DOB: 20-Jun-1961 Today's Date: 09/08/2021   History of Present Illness Pt is a 61 y.o. male admitted 08/25/21 with SOB. Workup for afib with RVR, CHF exacerbation, pleural effusion. PMH includes ESRD (HD MWF), CHF, afib, TAA, COPD, asthma; of note, recent admission 08/11/21 with chest pain, sacral wound.   Clinical Impression   Pt refusing OOB activity with OT but agreeable to bed level evaluation with encouragement from friend visiting. Pt reports he is able to ambulate with rollator at baseline and performs ADLs with some assist. Note frequent admissions recently and pt acknowledges noncompliance with fluid restrictions as well as dialysis. He reports he now knows he cannot miss HD due to how he is feeling. He reports pain in B feet due to edema as well as dryness. He characterizes the pain as "like my feet with crumble, like dry spaghetti noodles." May benefit from assessment for possible neuropathy. Reviewed some safety with ADLs including management of temperature for bathing etc given altered sensation in [place]. Additionally encouraged pt to communicate fluid intake and attempt to adhere to restrictions. Pt verbalized understanding. He is on 2L O2 via nasal cannula and experienced several drifts in saturation to 87% especially with bed mobility. Will continue to follow acutely and would recommend HHOT if pt willing to receive.      Recommendations for follow up therapy are one component of a multi-disciplinary discharge planning process, led by the attending physician.  Recommendations may be updated based on patient status, additional functional criteria and insurance authorization.   Follow Up Recommendations  Home health OT    Assistance Recommended at Discharge Intermittent Supervision/Assistance  Patient can return home with the following A little help with bathing/dressing/bathroom;A little help with  walking and/or transfers;Direct supervision/assist for medications management    Functional Status Assessment  Patient has had a recent decline in their functional status and/or demonstrates limited ability to make significant improvements in function in a reasonable and predictable amount of time  Equipment Recommendations  BSC/3in1    Recommendations for Other Services PT consult     Precautions / Restrictions Precautions Precautions: Fall Precaution Comments: Wears 2.5L O2 baseline Restrictions Weight Bearing Restrictions: No      Mobility Bed Mobility Overal bed mobility: Modified Independent Bed Mobility: Supine to Sit (long sit using bed rail), posterior scoot performed with max A, pt using RUE to assist but unable to tolerate pushing through B feet         Transfers Overall transfer level: Needs assistance         General transfer comment: refused OOB activity due to foot pain          ADL either performed or assessed with clinical judgement   ADL Overall ADL's : Needs assistance/impaired Eating/Feeding: Supervision/ safety Eating/Feeding Details (indicate cue type and reason): reinforced need for fluid restriction Grooming: Set up;Bed level   Upper Body Bathing: Set up   Lower Body Bathing: Minimal assistance   Upper Body Dressing : Modified independent   Lower Body Dressing: Minimal assistance   Toilet Transfer: Moderate assistance   Toileting- Clothing Manipulation and Hygiene: Minimal assistance               Vision Baseline Vision/History: 0 No visual deficits Ability to See in Adequate Light: 0 Adequate Patient Visual Report: No change from baseline Vision Assessment?: No apparent visual deficits            Pertinent Vitals/Pain  Pain Assessment: Faces Faces Pain Scale: Hurts even more Pain Location: back and B feet Pain Descriptors / Indicators: Pins and needles;Squeezing Pain Intervention(s): Limited activity within patient's  tolerance;Monitored during session     Hand Dominance Right   Extremity/Trunk Assessment Upper Extremity Assessment Upper Extremity Assessment: LUE deficits/detail LUE Deficits / Details: baseline weakness from hx of stroke LUE Coordination: decreased fine motor;decreased gross motor   Lower Extremity Assessment Lower Extremity Assessment: Defer to PT evaluation (does report feeling his feet have become "uncooked spaghetti," states it feels like they will crumble if he stands)       Communication Communication Communication: No difficulties   Cognition Arousal/Alertness: Awake/alert Behavior During Therapy: WFL for tasks assessed/performed Overall Cognitive Status: Within Functional Limits for tasks assessed                 General Comments    Pt reporting he wants to order a foot bath to help manage his foot pain, provided extensive education on need to monitor water temperature especially given nature of reported pain in B feet, he does have some decreased sensation in L foot from knee down (hx CVA with L sided involvement), may benefit from further evaluation for possible neuropathy             Home Living Family/patient expects to be discharged to:: Private residence Living Arrangements: Non-relatives/Friends Available Help at Discharge: Friend(s);Available 24 hours/day Type of Home: House Home Access: Stairs to enter [employer] of Steps: 2 Entrance Stairs-Rails: None Home Layout: One level     Bathroom Shower/Tub: Teacher, early years/pre: Standard     Home Equipment: Rollator (4 wheels);Cane - single point;Shower seat;Wheelchair - manual   Additional Comments: pt reports good friends live with him and provides as much assist as needed      Prior Functioning/Environment Prior Level of Function : Needs assist       Physical Assist : Mobility (physical);ADLs (physical)     Mobility Comments: limited ambulator with rollator,  states his friend currently does not let him drive ADLs Comments: reports using shower chair and having assist from friend for all ADLs        OT Problem List: Decreased activity tolerance;Decreased safety awareness;Pain;Decreased knowledge of use of DME or AE      OT Treatment/Interventions: Self-care/ADL training;DME and/or AE instruction;Patient/family education;Energy conservation    OT Goals(Current goals can be found in the care plan section) Acute Rehab OT Goals Patient Stated Goal: minimize pain, increase "normalcy" in his life by learning to walk without relying on AD OT Goal Formulation: With patient Time For Goal Achievement: 09/22/21 Potential to Achieve Goals: Fair ADL Goals Pt Will Perform Lower Body Dressing: with modified independence;with adaptive equipment;sit to/from stand Pt Will Transfer to Toilet: with modified independence;ambulating Pt Will Perform Toileting - Clothing Manipulation and hygiene: with modified independence  OT Frequency: Min 2X/week       AM-PAC OT "6 Clicks" Daily Activity     Outcome Measure Help from another person eating meals?: None Help from another person taking care of personal grooming?: None Help from another person toileting, which includes using toliet, bedpan, or urinal?: A Little Help from another person bathing (including washing, rinsing, drying)?: A Little Help from another person to put on and taking off regular upper body clothing?: A Little Help from another person to put on and taking off regular lower body clothing?: A Lot 6 Click Score: 19   End of Session Equipment Utilized  During Treatment: Oxygen  Activity Tolerance: Patient tolerated treatment well Patient left: in bed  OT Visit Diagnosis: Pain;Other abnormalities of gait and mobility (R26.89)                Time: 1660-6301 OT Time Calculation (min): 22 min Charges:  OT General Charges $OT Visit: 1 Visit OT Evaluation $OT Eval Low Complexity: 1 Low OT  Treatments $Therapeutic Activity: 8-22 mins   Naomie Dean Morgen Ritacco, OTR/L 09/08/2021, 12:45 PM

## 2021-09-08 NOTE — Progress Notes (Signed)
PROGRESS NOTE    Thomas Robinson  EQA:[id number] DOB: Mar 15, 1961 DOA: 09/07/2021 PCP: [name], PA    Chief Complaint  Patient presents with   Weakness    Brief Narrative:  Thomas Robinson is a 61 y.o. male with medical history significant of ESRD on HD MWF, ascending aortic aneurysm, PEA arrest 97/9892, Diastolic CHF, Atrial fibrillation, prior CVA, COPD on chronic oxygen 2.5 L,   Presented with   shortness of breath cough  Patient with recurrent admissions with failure and fluid overload in the setting of noncompliance with HD. Last similar admission was just last week was discharged on 29 August 2021  Subjective:  He has afib/rvr, is started on cardizem drip this am Reports cramping from dialysis He is on 4liter oxygen Friend at bedside   Assessment & Plan:   Principal Problem:   Hyperkalemia Active Problems:   End stage renal disease (HCC)   Anemia of chronic disease   Elevated troponin   Atrial fibrillation with RVR (HCC)   Acute on chronic diastolic (congestive) heart failure (HCC)   Chronic respiratory failure with hypoxia (HCC)   Fever   Hypotension   Acute on chronic hypoxic respiratory failure due to acute on chronic dCHF due to noncompliance with dialysis -Baseline oxygen requirement 2.5 L, currently he is on 4 L -Chest x-ray with pleural effusion/pulmonary edema, has bilateral lower extremity pitting edema on exam -Received urgent dialysis, however signed out off dialysis earlier -Dialysis management per nephrology  A. fib RVR Started on Cardizem drip Continue Eliquis  ESRD, noncompliance with dialysis Presented with volume overload/hyperkalemia Received urgent dialysis, however signout of dialysis earlier Management per nephrology  Poor memory Possibly from prior CVA    Body mass index is 17.06 kg/m..  .     Skin Assessment:  I have examined the patients skin and I agree with the wound assessment as performed by the wound  care RN as outlined below:  Pressure Injury 07/19/21 Coccyx Posterior;Medial Stage 2 -  Partial thickness loss of dermis presenting as a shallow open injury with a red, pink wound bed without slough. (Active)  07/19/21 1130  Location: Coccyx  Location Orientation: Posterior;Medial  Staging: Stage 2 -  Partial thickness loss of dermis presenting as a shallow open injury with a red, pink wound bed without slough.  Wound Description (Comments):   Present on Admission: Yes     Pressure Injury 07/23/21 Heel Left Deep Tissue Pressure Injury - Purple or maroon localized area of discolored intact skin or blood-filled blister due to damage of underlying soft tissue from pressure and/or shear. (Active)  07/23/21 2241  Location: Heel  Location Orientation: Left  Staging: Deep Tissue Pressure Injury - Purple or maroon localized area of discolored intact skin or blood-filled blister due to damage of underlying soft tissue from pressure and/or shear.  Wound Description (Comments):   Present on Admission:     Unresulted Labs (From admission, onward)     Start     Ordered   09/07/21 2300  Brain natriuretic peptide  Now then every 6 hours,   R (with TIMED occurrences)      09/07/21 2027   09/07/21 2224  Hepatitis B surface antibody,quantitative  (New Admission Hemo Labs (Hepatitis B))  ONCE - STAT,   STAT        09/07/21 2223   09/07/21 1744  Resp Panel by RT-PCR (Flu A&B, Covid)  (Tier 2 - Symptomatic/asymptomatic)  Once,   STAT  09/07/21 1743   09/07/21 1724  Urinalysis, Routine w reflex microscopic  Once,   STAT        09/07/21 1723              DVT prophylaxis: apixaban (ELIQUIS) tablet 2.5 mg Start: 09/08/21 0315 apixaban (ELIQUIS) tablet 2.5 mg   Code Status: Full Family Communication: Patient Disposition:   Status is: Inpatient  Dispo: The patient is from: Home              Anticipated d/c is to: TBD              Anticipated d/c date is: TBD    Objective: Vitals:    09/07/21 2357 09/08/21 0012 09/08/21 0200 09/08/21 0705  BP: 105/78 101/67 112/71 (!) 141/94  Pulse:   (!) 136 98  Resp: 20 (!) 24 (!) 24   Temp:   98.4 F (36.9 C) 98.5 F (36.9 C)  TempSrc:   Oral Oral  SpO2: 100% 98% 95% 94%  Height:   5\' 8"  (1.727 m)     Intake/Output Summary (Last 24 hours) at 09/08/2021 1039 Last data filed at 09/08/2021 0856 Gross per 24 hour  Intake 621.48 ml  Output --  Net 621.48 ml   There were no vitals filed for this visit.  Examination:  General exam: alert, awake, communicative,calm, NAD, poor short term memory, immediate recall 2/3 Respiratory system:  diminished at bases, no wheezing, no rales. Respiratory effort normal. Cardiovascular system:  RRR.  Gastrointestinal system: Abdomen is nondistended, soft and nontender.  Normal bowel sounds heard. Central nervous system: Alert and oriented.x3 No focal neurological deficits. Extremities: Bilateral pitting pedal edema Skin: No rashes, lesions or ulcers Psychiatry: Currently calm and cooperative, does has poor short-term memory    Data Reviewed: I have personally reviewed following labs and imaging studies  CBC: Recent Labs  Lab 09/07/21 1741 09/07/21 1750 09/08/21 0214  WBC 5.3  --  5.1  NEUTROABS  --   --  3.6  HGB 9.9* 11.2* 10.1*  HCT 32.7* 33.0* 32.2*  MCV 108.6*  --  107.3*  PLT 151  --  295    Basic Metabolic Panel: Recent Labs  Lab 09/07/21 1723 09/07/21 1741 09/07/21 1750 09/08/21 0214  NA  --  139 133* 138  K  --  7.0* 7.0* 4.1  CL  --  97* 101 98  CO2  --  25  --  28  GLUCOSE  --  78 79 94  BUN  --  46* 68* 21  CREATININE  --  6.29* 6.30* 3.78*  CALCIUM  --  10.4*  --  10.0  MG 2.7*  --   --  2.2  PHOS  --   --   --  4.4    GFR: Estimated Creatinine Clearance: 14.6 mL/min (A) (by C-G formula based on SCr of 3.78 mg/dL (H)).  Liver Function Tests: Recent Labs  Lab 09/07/21 1723 09/08/21 0214  AST 22 18  ALT 18 18  ALKPHOS 97 104  BILITOT 1.3* 1.3*   PROT 7.3 7.1  ALBUMIN 3.2* 3.2*    CBG: No results for input(s): GLUCAP in the last 168 hours.   Recent Results (from the past 240 hour(s))  Blood culture (routine x 2)     Status: None (Preliminary result)   Collection Time: 09/07/21  5:41 PM   Specimen: BLOOD LEFT ARM  Result Value Ref Range Status   Specimen Description BLOOD LEFT ARM  Final  Special Requests   Final    BOTTLES DRAWN AEROBIC AND ANAEROBIC Blood Culture results may not be optimal due to an inadequate volume of blood received in culture bottles   Culture   Final    NO GROWTH < 24 HOURS Performed at [facility] [address], [place], [place] [address]    Report Status PENDING  Incomplete  Blood culture (routine x 2)     Status: None (Preliminary result)   Collection Time: 09/07/21  5:55 PM   Specimen: BLOOD  Result Value Ref Range Status   Specimen Description BLOOD LEFT ANTECUBITAL  Final   Special Requests   Final    BOTTLES DRAWN AEROBIC ONLY Blood Culture results may not be optimal due to an inadequate volume of blood received in culture bottles   Culture   Final    NO GROWTH < 24 HOURS Performed at [facility], [place] [address], [place], [place] [address]    Report Status PENDING  Incomplete         Radiology Studies: DG Chest Port 1 View  Result Date: 09/07/2021 CLINICAL DATA:  Weakness and shortness of breath. EXAM: PORTABLE CHEST 1 VIEW COMPARISON:  Most recent radiograph 08/28/2021. Chest CTA 08/26/2021 FINDINGS: Right-sided dialysis catheter unchanged in position. Cardiomegaly is stable. Increasing right pleural effusion and basilar atelectasis. Increasing pulmonary edema. No pneumothorax. Stable osseous structures. IMPRESSION: 1. Increasing right pleural effusion and basilar atelectasis. 2. Increasing pulmonary edema. Electronically Signed   By: Keith Rake M.D.   On: 09/07/2021 17:58        Scheduled Meds:  apixaban  2.5 mg Oral BID   Chlorhexidine Gluconate Cloth  6  each Topical Q0600   sevelamer carbonate  1,600 mg Oral TID WC   sodium chloride flush  3 mL Intravenous Q12H   Continuous Infusions:  sodium chloride     diltiazem (CARDIZEM) infusion 15 mg/hr (09/08/21 0555)     LOS: 1 day   Time spent: 94mins Greater than 50% of this time was spent in counseling, explanation of diagnosis, planning of further management, and coordination of care.   Voice Recognition Viviann Spare dictation system was used to create this note, attempts have been made to correct errors. Please contact the author with questions and/or clarifications.   Florencia Reasons, MD PhD FACP [facility]  Available via Epic secure chat 7am-7pm for nonurgent issues Please page for urgent issues To page the attending provider between 7A-7P or the covering provider during after hours 7P-7A, please log into the web site [email or web address] and access using universal [place] password for that web site. If you do not have the password, please call the hospital operator.    09/08/2021, 10:39 AM

## 2021-09-08 NOTE — TOC Initial Note (Signed)
Transition of Care [facility]) - Initial/Assessment Note    Patient Details  Name: Thomas Robinson MRN: [id number] Date of Birth: August 07, 1961  [facility] CM/SW Contact:    Bethena Roys, RN Phone Number: 09/08/2021, 2:52 PM  Clinical Narrative: Risk for readmission assessment completed. Prior to arrival patient was from home with a friend/roommate. Patient states he has durable medical equipment (DME) rolling walker and wheelchair. Patient states he has someone to drive him to appointments. He has a PCP; however, he may be changing providers soon. Case Manager discussed home health services with the patient and he is agreeable to services. Patient does not have a preference regarding the home health agency. Case Manager then called [facility] and they can service the patient. Patient will need [facility] PT/OT orders with F2F once stable.  Case Manager will continue to follow for additional toc needs.                           Expected Discharge Plan: [place] Barriers to Discharge: Continued Medical Work up   Patient Goals and CMS Choice Patient states their goals for this hospitalization and ongoing recovery are:: to return home   Choice offered to / list presented to : Patient  Expected Discharge Plan and Services Expected Discharge Plan: [place] In-house Referral: NA Discharge Planning Services: CM Consult Post Acute Care Choice: [place] arrangements for the past 2 months: Apartment                 DME Arranged: N/A DME Agency: NA       HH Arranged: PT, OT HH Agency: [facility] Date HH Agency Contacted: 09/08/21 Time HH Agency Contacted: 43 Representative spoke with at [facility]: Amy  Prior Living Arrangements/Services Living arrangements for the past 2 months: Apartment Lives with:: Self, Roommate Patient language and need for interpreter reviewed:: Yes Do you feel safe going back to the place  where you live?: Yes      Need for Family Participation in Patient Care: Yes (Comment) Care giver support system in place?: Yes (comment) Current home services: DME (Patient states he has a rolling walker and wheelchair.) Criminal Activity/Legal Involvement Pertinent to Current Situation/Hospitalization: No - Comment as needed  Permission Sought/Granted Permission sought to share information with : Family Supports, Case Freight forwarder, Chartered certified accountant granted to share information with : Yes, Verbal Permission Granted     Permission granted to share info w AGENCY: [facility]        Emotional Assessment Appearance:: Appears stated age Attitude/Demeanor/Rapport: Engaged Affect (typically observed): Appropriate Orientation: : Oriented to Situation, Oriented to  Time, Oriented to Place, Oriented to Self Alcohol / Substance Use: Not Applicable Psych Involvement: No (comment)  Admission diagnosis:  Hyperkalemia [E87.5] SOB (shortness of breath) [R06.02] ESRD (end stage renal disease) on dialysis ([place]) [N18.6, Z99.2] Atrial fibrillation, unspecified type ([place]) [I48.91] Patient Active Problem List   Diagnosis Date Noted   Chronic respiratory failure with hypoxia ([place]) 09/07/2021   Fever 09/07/2021   Hypotension 09/07/2021   Acute on chronic diastolic (congestive) heart failure ([place]) 08/26/2021   Fluid overload 08/25/2021   Atrial fibrillation with RVR ([place]) 08/25/2021   Sacral pressure sore 08/12/2021   Shortness of breath 07/16/2021   Balanitis 07/16/2021   Vomiting    Gastritis and gastroduodenitis    Grade III hemorrhoids    Decubitus ulcer of coccyx, stage 2 (  [place]) 07/08/2021   Prolonged QT interval 07/08/2021   Hematochezia    Atrial flutter (HCC)    Pressure injury of skin 07/02/2021   Abnormal chest CT 06/30/2021   Pleural effusion, right 06/30/2021   SIRS (systemic inflammatory response syndrome) ([place]) 06/30/2021   Acute hypoxemic respiratory failure  ([place]) 05/18/2021   Bronchitis 05/18/2021   Preoperative cardiovascular examination 03/06/2021   CHF (congestive heart failure) ([place]) 02/17/2021   Cellulitis of arm, left 02/01/2021   Cellulitis of left lower limb 02/01/2021   Cellulitis, unspecified 02/01/2021   Left arm swelling 01/25/2021   Acute and chronic respiratory failure with hypoxia ([place]) 01/13/2021   Acute on chronic respiratory failure with hypoxia ([place]) 01/12/2021   Concussion with no loss of consciousness 01/12/2021   Laceration without foreign body of left forearm, initial encounter 01/12/2021   Age-related physical debility 09/20/2020   Allergy, unspecified, initial encounter 09/20/2020   Nausea 09/20/2020   Pain, unspecified 09/20/2020   Pruritus, unspecified 09/20/2020   S/P cardiac cath 08/27/2020   Acute encephalopathy 08/26/2020   Peripheral neuropathy 08/26/2020   Elevated troponin 08/25/2020   Hyperkalemia 08/05/2020   Arthritis    Asthma    Cataract    Cerebral infarction due to thrombosis of cerebral artery (HCC)    History of CVA (cerebrovascular accident)    Chronic kidney disease    Dyspnea    Hyperlipidemia    Hypertension    Kidney failure    Restless leg syndrome    Degenerative lumbar spinal stenosis 53/29/9242   Diastolic dysfunction 68/34/1962   Renovascular hypertension 06/22/2020   Demand ischemia ([place]) 06/18/2020   LVH (left ventricular hypertrophy) due to hypertensive disease, with heart failure ([place]) 06/18/2020   Myofascial pain syndrome 06/12/2020   Encounter for removal of sutures 03/29/2020   History of fusion of cervical spine 03/26/2020   Non-compliance with renal dialysis ([place]) 02/08/2020   Pulmonary edema 01/23/2020   Cervical disc herniation 01/04/2020   Tachycardia 01/04/2020   SOB (shortness of breath)    Hypoxia 12/30/2019   Chronic low back pain 11/24/2019   Degeneration of lumbar intervertebral disc 11/24/2019   Lumbar radiculopathy 11/24/2019   Left shoulder pain  11/11/2019   Anaphylactic shock, unspecified, sequela 06/10/2019   Rib fractures 06/05/2019   Fall at home, initial encounter 06/04/2019   Right rib fracture 06/04/2019   Lung contusion 06/04/2019   Acute respiratory failure with hypoxia ([place]) 06/04/2019   Anemia in ESRD (end-stage renal disease) ([place]) 06/04/2019   GERD without esophagitis 06/04/2019   Chronic diastolic (congestive) heart failure ([place]) 06/04/2019   Fall 06/04/2019   Thoracic ascending aortic aneurysm 06/04/2019   Acute exacerbation of congestive heart failure ([place]) 05/16/2019   Carpal tunnel syndrome of right wrist 10/05/2018   Ulnar neuropathy 10/05/2018   Neuritis of right ulnar nerve 09/13/2018   Pain in joint of right elbow 09/13/2018   Chest pain 04/13/2018   Chronic diastolic heart failure ([place]) 04/13/2018   Acute on chronic diastolic CHF (congestive heart failure) ([place]) 04/13/2018   Atypical chest pain 02/16/2018   ESRD on dialysis ([place]) 02/16/2018   End stage renal disease ([place]) 02/16/2018   CAP (community acquired pneumonia) 10/23/2017   Asthma, chronic, unspecified asthma severity, with acute exacerbation 10/23/2017   Respiratory failure, acute ([place]) 10/23/2017   Pulmonary hypertension ([place]) 10/23/2017   Iron deficiency anemia, unspecified 09/09/2017   Gram-negative sepsis, unspecified ([place]) 09/05/2017   Encounter for immunization 07/08/2017   Moderate protein-calorie malnutrition ([place]) 06/06/2017  Coagulation defect, unspecified ([place]) 06/04/2017   Hypokalemia 06/04/2017   Other hyperlipidemia 06/04/2017   Secondary hyperparathyroidism of renal origin ([place]) 06/04/2017   Macrocytic anemia 05/24/2017   Uremia 68/37/2902   Metabolic acidosis 07/17/5207   Hypertensive urgency 05/24/2017   Acute kidney injury superimposed on CKD ([place]) 05/24/2017   CKD (chronic kidney disease), stage V ([place]) 05/24/2017   GI bleeding 05/24/2017   Anemia of chronic disease 05/24/2017   Stroke ([place]) 2018   AKI (acute  kidney injury) ([place]) 04/10/2015   History of completed stroke    Essential hypertension 04/09/2015   PCP:  [name], PA Pharmacy:   [id number] [address], [place] [address] Phone: [phone] Fax: [phone]  [facility] [place], [place] - [address] [place] [place], [place] [address] Phone: [phone] Fax: [phone]     Readmission Risk Interventions Readmission Risk Prevention Plan 09/08/2021 01/15/2021 09/11/2020  Transportation Screening Complete Complete Complete  Medication Review (RN Care Manager) Complete Complete -  PCP or Specialist appointment within 3-5 days of discharge - - Not Complete  PCP/Specialist Appt Not Complete comments - - first available appt is 09/26/20  [place] or Home Care Consult Complete Complete (No Data)  SW Recovery Care/Counseling Consult Complete Complete Complete  Palliative Care Screening Not Applicable Not Applicable Not [place] Not Applicable Not Applicable Not Applicable  Some recent data might be hidden

## 2021-09-09 LAB — HEPATITIS B SURFACE ANTIBODY, QUANTITATIVE: Hep B S AB Quant (Post): <3.1 m[IU]/mL — ABNORMAL LOW (ref >9.9)

## 2021-09-09 MED ORDER — HEPARIN SODIUM (PORCINE) 1000 UNIT/ML IJ SOLN
INTRAMUSCULAR | Status: AC
  Filled 2021-09-09: qty 1

## 2021-09-09 MED ORDER — DILTIAZEM HCL 60 MG PO TABS
60.0000 mg | ORAL_TABLET | Freq: Four times a day (QID) | ORAL | Status: DC
  Administered 2021-09-09 – 2021-09-13 (×15): 60 mg via ORAL
  Filled 2021-09-09 (×15): qty 1

## 2021-09-09 MED ORDER — MIDODRINE HCL 5 MG PO TABS: 10.0000 mg | ORAL_TABLET | ORAL | Status: DC

## 2021-09-09 MED ORDER — DM-GUAIFENESIN ER 30-600 MG PO TB12
1.0000 | ORAL_TABLET | Freq: Two times a day (BID) | ORAL | Status: DC
  Administered 2021-09-09 – 2021-09-12 (×8): 1 via ORAL
  Filled 2021-09-09 (×8): qty 1

## 2021-09-09 MED ORDER — ENSURE ENLIVE PO LIQD
237.0000 mL | Freq: Two times a day (BID) | ORAL | Status: DC
  Administered 2021-09-10 – 2021-09-13 (×5): 237 mL via ORAL

## 2021-09-09 MED ORDER — PROSOURCE PLUS PO LIQD
30.0000 mL | Freq: Two times a day (BID) | ORAL | Status: DC
  Filled 2021-09-09: qty 30

## 2021-09-09 MED ORDER — LEVALBUTEROL HCL 0.63 MG/3ML IN NEBU
0.6300 mg | INHALATION_SOLUTION | Freq: Two times a day (BID) | RESPIRATORY_TRACT | Status: DC
  Filled 2021-09-09: qty 3

## 2021-09-09 MED ORDER — LEVALBUTEROL HCL 0.63 MG/3ML IN NEBU
0.6300 mg | INHALATION_SOLUTION | Freq: Three times a day (TID) | RESPIRATORY_TRACT | Status: DC
  Administered 2021-09-09: 0.63 mg via RESPIRATORY_TRACT
  Filled 2021-09-09: qty 3

## 2021-09-09 MED ORDER — RENA-VITE PO TABS
1.0000 | ORAL_TABLET | Freq: Every day | ORAL | Status: DC
  Administered 2021-09-09 – 2021-09-12 (×4): 1 via ORAL
  Filled 2021-09-09 (×4): qty 1

## 2021-09-09 MED ORDER — LEVALBUTEROL HCL 0.63 MG/3ML IN NEBU
0.6300 mg | INHALATION_SOLUTION | Freq: Two times a day (BID) | RESPIRATORY_TRACT | Status: DC
  Administered 2021-09-09 – 2021-09-12 (×6): 0.63 mg via RESPIRATORY_TRACT
  Filled 2021-09-09 (×8): qty 3

## 2021-09-09 NOTE — Progress Notes (Signed)
Initial Nutrition Assessment  DOCUMENTATION CODES:   Severe malnutrition in context of acute illness/injury, Underweight  INTERVENTION:  - Recommend diet liberalization from renal to regular diet to provide more options for optimal PO intake - Renal MVI with minerals daily - Ensure Enlive po BID, each supplement provides 350 kcal and 20 grams of protein - 30 ml ProSource Plus BID, each supplement provides 100 kcals and 15 grams protein.   NUTRITION DIAGNOSIS:   Severe Malnutrition related to chronic illness (ESRD) as evidenced by severe fat depletion, severe muscle depletion.  GOAL:   Patient will meet greater than or equal to 90% of their needs  MONITOR:   PO intake, Supplement acceptance, Labs, Weight trends, Skin, I & O's  REASON FOR ASSESSMENT:   Consult Assessment of nutrition requirement/status  ASSESSMENT:   Pt admitted from home with weakness secondary to acute on chronic CHF in the setting of fluid overload d/t noncompliance with HD. PMH includes ESRD on HD (TThS), CHF, afib, COPD  Pt reports eating 2 meals per day on dialysis days and 3 on non-dialysis days. Sometimes he is provided Nepro during dialysis. He states that he usually eats shrimp, oysters, lobster, meatloaf, fruit and chex-mix. He does not typically drink dark sodas but does report drinking 2 energy drinks daily. He states that he had bilious vomiting after eating battered shrimp on Saturday. He reports wanting to be more mindful about how he eats when he is discharged. Pt reports that he does not enjoy the food as it "tastes like cardboard" and is eating less. RD provided Ensure during visit at pt's request and spoke with RN to verify this is within his fluid restriction.   Pt reports a usual dry weight of 135 lbs and endorses recent weight loss. He is unsure how much he currently weighs but states this has been very recent d/t "not feeling well." Pt with a 7% weight loss within the last month which is  significant for time frame.   Spoke with pt about eating a balanced diet of protein, vegetables and carbohydrates. Also the benefits of eating whole grains for the fiber content.   Medications: renvela  Labs: Cr 3.78, ionized Ca 1.05  UOP: 19mL I/O's: 1.3L since admission  EDW 51.5 kg  NUTRITION - FOCUSED PHYSICAL EXAM:  Flowsheet Row Most Recent Value  Orbital Region Severe depletion  Upper Arm Region Severe depletion  Thoracic and Lumbar Region Severe depletion  Buccal Region Severe depletion  Temple Region Severe depletion  Clavicle Bone Region Severe depletion  Clavicle and Acromion Bone Region Severe depletion  Scapular Bone Region Severe depletion  Dorsal Hand Severe depletion  Patellar Region Severe depletion  Anterior Thigh Region Severe depletion  Posterior Calf Region Severe depletion  Edema (RD Assessment) Mild  [BLE]  Hair Reviewed  Eyes Reviewed  Mouth Reviewed  Skin Reviewed  Nails Reviewed       Diet Order:   Diet Order             Diet regular Room service appropriate? Yes; Fluid consistency: Thin; Fluid restriction: 1200 mL Fluid  Diet effective now                   EDUCATION NEEDS:   Education needs have been addressed  Skin:  Skin Assessment: Skin Integrity Issues: Skin Integrity Issues:: Stage II, Other (Comment) Stage II: coccyx Other: L heel deep tissue PI  Last BM:  PTA  Height:   Ht Readings from Last 1 Encounters:  09/08/21 5\' 8"  (1.727 m)    Weight:   Wt Readings from Last 1 Encounters:  09/09/21 54.1 kg    BMI:  Body mass index is 18.13 kg/m.  Estimated Nutritional Needs:   Kcal:  1800-2000  Protein:  90-100g  Fluid:  >/=1.8L  Thomas Robinson, RDN, LDN Clinical Nutrition

## 2021-09-09 NOTE — Progress Notes (Signed)
PROGRESS NOTE    Thomas Robinson  VOJ:[id number] DOB: 05-Jun-1961 DOA: 09/07/2021 PCP: [name], PA   Brief Narrative: 61 year old with past medical history significant for ESRD on hemodialysis MWF, ascending aortic aneurysm, PEA arrest 99/3716, diastolic heart failure, A. fib, prior CVA, COPD on chronic oxygen 2.5 L presented with shortness of breath and cough.  Patient with recurrent admissions with failure and fluid overload in the setting of noncompliance with hemodialysis.  Last similar admission just last week was discharged on December 20 05/2021.  Patient also developed A. fib with RVR 1/8 and was a started on IV Cardizem.  Plan to transition to oral Cardizem today.   Assessment & Plan:   Principal Problem:   Hyperkalemia Active Problems:   End stage renal disease (HCC)   Anemia of chronic disease   Elevated troponin   Atrial fibrillation with RVR (HCC)   Acute on chronic diastolic (congestive) heart failure (HCC)   Chronic respiratory failure with hypoxia (HCC)   Fever   Hypotension   1-Acute on chronic hypoxic respiratory failure secondary to acute on chronic diastolic heart failure exacerbation, pulmonary edema secondary to noncompliance with hemodialysis -On chronic 2.5 L of oxygen--currently on 4 to 5 L. -Chest x-ray with pleural effusion/pulmonary edema, has bilateral lower extremity edema -He received urgent dialysis on admission however he signed out of earlier -Currently getting hemodialysis 1/9 -add guaifenesin/dextromethorphan.  -Nebulizer/   2-A. fib RVR Mild elevation of troponin in the setting of A. fib RVR He was a started on Cardizem drip.  Plan to transition to oral Continue with Eliquis  3-ESRD on hemodialysis noncompliance with hemodialysis Renal admission for volume overload and hyperkalemia Appreciate  nephrology's assistant.  Anemia of chronic disease: Monitor hb.   Elevation of TSH: He will need  thyroid function test in 4 to 6  weeks Check free t3 and free t 4  Poor memory  from prior history of CVA  See wound care documentation Pressure Injury 07/19/21 Coccyx Posterior;Medial Stage 2 -  Partial thickness loss of dermis presenting as a shallow open injury with a red, pink wound bed without slough. (Active)  07/19/21 1130  Location: Coccyx  Location Orientation: Posterior;Medial  Staging: Stage 2 -  Partial thickness loss of dermis presenting as a shallow open injury with a red, pink wound bed without slough.  Wound Description (Comments):   Present on Admission: Yes     Pressure Injury 07/23/21 Heel Left Deep Tissue Pressure Injury - Purple or maroon localized area of discolored intact skin or blood-filled blister due to damage of underlying soft tissue from pressure and/or shear. (Active)  07/23/21 2241  Location: Heel  Location Orientation: Left  Staging: Deep Tissue Pressure Injury - Purple or maroon localized area of discolored intact skin or blood-filled blister due to damage of underlying soft tissue from pressure and/or shear.  Wound Description (Comments):   Present on Admission:         Estimated body mass index is 18.13 kg/m as calculated from the following:   Height as of this encounter: 5\' 8"  (1.727 m).   Weight as of this encounter: 54.1 kg.   DVT prophylaxis: Eliquis Code Status: Full code Family Communication: care discussed with patient.  Disposition Plan:  Status is: Inpatient  Remains inpatient appropriate because; admitted with acute on chronic hypoxic respiratory failure secondary to pulmonary edema, A. fib RVR        Consultants:  Nephrology   Procedures:  HD  Antimicrobials:  Subjective: He complaints of cough, paroxysmal.  Report chest pain after he cough.   Objective: Vitals:   09/09/21 1030 09/09/21 1100 09/09/21 1130 09/09/21 1225  BP: 111/63 (!) 118/57 122/62 136/78  Pulse: 72 62 69 67  Resp: 19 19  18   Temp:    98.6 F (37 C)  TempSrc:    Oral   SpO2:    (!) 89%  Weight:      Height:        Intake/Output Summary (Last 24 hours) at 09/09/2021 1411 Last data filed at 09/09/2021 0300 Gross per 24 hour  Intake 477 ml  Output 0 ml  Net 477 ml   Filed Weights   09/09/21 0032 09/09/21 0911  Weight: 54.2 kg 54.1 kg    Examination:  General exam: Appears calm and comfortable  Respiratory system: BL wheezing Cardiovascular system: S1 & S2 heard, IRR Gastrointestinal system: Abdomen is nondistended, soft and nontender. No organomegaly or masses felt. Normal bowel sounds heard. Central nervous system: Alert and oriented. No focal neurological deficits. Extremities: Symmetric 5 x 5 power.    Data Reviewed: I have personally reviewed following labs and imaging studies  CBC: Recent Labs  Lab 09/07/21 1741 09/07/21 1750 09/08/21 0214  WBC 5.3  --  5.1  NEUTROABS  --   --  3.6  HGB 9.9* 11.2* 10.1*  HCT 32.7* 33.0* 32.2*  MCV 108.6*  --  107.3*  PLT 151  --  315   Basic Metabolic Panel: Recent Labs  Lab 09/07/21 1723 09/07/21 1741 09/07/21 1750 09/08/21 0214  NA  --  139 133* 138  K  --  7.0* 7.0* 4.1  CL  --  97* 101 98  CO2  --  25  --  28  GLUCOSE  --  78 79 94  BUN  --  46* 68* 21  CREATININE  --  6.29* 6.30* 3.78*  CALCIUM  --  10.4*  --  10.0  MG 2.7*  --   --  2.2  PHOS  --   --   --  4.4   GFR: Estimated Creatinine Clearance: 15.5 mL/min (A) (by C-G formula based on SCr of 3.78 mg/dL (H)). Liver Function Tests: Recent Labs  Lab 09/07/21 1723 09/08/21 0214  AST 22 18  ALT 18 18  ALKPHOS 97 104  BILITOT 1.3* 1.3*  PROT 7.3 7.1  ALBUMIN 3.2* 3.2*   No results for input(s): LIPASE, AMYLASE in the last 168 hours. No results for input(s): AMMONIA in the last 168 hours. Coagulation Profile: No results for input(s): INR, PROTIME in the last 168 hours. Cardiac Enzymes: Recent Labs  Lab 09/08/21 0213  CKTOTAL 17*   BNP (last 3 results) No results for input(s): PROBNP in the last 8760  hours. HbA1C: No results for input(s): HGBA1C in the last 72 hours. CBG: No results for input(s): GLUCAP in the last 168 hours. Lipid Profile: No results for input(s): CHOL, HDL, LDLCALC, TRIG, CHOLHDL, LDLDIRECT in the last 72 hours. Thyroid Function Tests: Recent Labs    09/08/21 0214  TSH 7.750*   Anemia Panel: No results for input(s): VITAMINB12, FOLATE, FERRITIN, TIBC, IRON, RETICCTPCT in the last 72 hours. Sepsis Labs: Recent Labs  Lab 09/07/21 2044 09/08/21 0213  LATICACIDVEN 1.9 2.4*    Recent Results (from the past 240 hour(s))  Blood culture (routine x 2)     Status: None (Preliminary result)   Collection Time: 09/07/21  5:41 PM   Specimen: BLOOD LEFT ARM  Result Value Ref Range Status   Specimen Description BLOOD LEFT ARM  Final   Special Requests   Final    BOTTLES DRAWN AEROBIC AND ANAEROBIC Blood Culture results may not be optimal due to an inadequate volume of blood received in culture bottles   Culture   Final    NO GROWTH 2 DAYS Performed at [facility] [address] [place], [place] [address]    Report Status PENDING  Incomplete  Blood culture (routine x 2)     Status: None (Preliminary result)   Collection Time: 09/07/21  5:55 PM   Specimen: BLOOD  Result Value Ref Range Status   Specimen Description BLOOD LEFT ANTECUBITAL  Final   Special Requests   Final    BOTTLES DRAWN AEROBIC ONLY Blood Culture results may not be optimal due to an inadequate volume of blood received in culture bottles   Culture   Final    NO GROWTH 2 DAYS Performed at [facility], [place] [address] [place], [place] [address]    Report Status PENDING  Incomplete         Radiology Studies: DG Chest Port 1 View  Result Date: 09/07/2021 CLINICAL DATA:  Weakness and shortness of breath. EXAM: PORTABLE CHEST 1 VIEW COMPARISON:  Most recent radiograph 08/28/2021. Chest CTA 08/26/2021 FINDINGS: Right-sided dialysis catheter unchanged in position. Cardiomegaly is  stable. Increasing right pleural effusion and basilar atelectasis. Increasing pulmonary edema. No pneumothorax. Stable osseous structures. IMPRESSION: 1. Increasing right pleural effusion and basilar atelectasis. 2. Increasing pulmonary edema. Electronically Signed   By: Keith Rake M.D.   On: 09/07/2021 17:58        Scheduled Meds:  apixaban  2.5 mg Oral BID   Chlorhexidine Gluconate Cloth  6 each Topical Q0600   dextromethorphan-guaiFENesin  1 tablet Oral BID   diltiazem  60 mg Oral Q6H   heparin sodium (porcine)       sevelamer carbonate  1,600 mg Oral TID WC   sodium chloride flush  3 mL Intravenous Q12H   Continuous Infusions:  sodium chloride     diltiazem (CARDIZEM) infusion 15 mg/hr (09/09/21 0718)     LOS: 2 days    Time spent: 35 minutes.     Elmarie Shiley, MD [facility]   If 7PM-7AM, please contact night-coverage [email or web address]  09/09/2021, 2:11 PM

## 2021-09-09 NOTE — Progress Notes (Addendum)
[facility] Progress Note   61 y.o. male HTN, dCHF, CVA, ascending aortic aneurysm, PEA arrest 07/2021, aflutter, COPD on 2.5L O2 at home, ESRD with history of severe [place]. He's had many admissions for hypoxia in the setting of shortened or missed dialysis treatments. Last  treatments in the hospital were on 12/27 and 12/28; last outpatient treatments at [facility] were on 1/2 and 1/5 signing off early each time. 2hr60min and 2hr 63min, . Here w/ generalized aching, chest pain and whole body itching.  OP HD:  [facility] MWF 3.5h  400/500  51.5 kg  2/2 bath  RIJ TDC/  L AVF unusable  - edw appears to be 51.5-52 kg, did leave above his EDW last HD  - mircera 150 ug q2 last 12/21  - venofer 100mg  IV weekly    CXR 1/07 - IMPRESSION: 1. Increasing right pleural effusion and basilar atelectasis. 2. Increasing pulmonary edema.    Assessment/ Plan:   AHRF/ vol overload - last ECHO 08/12/21 EF 60-65%. Vol overload most likely cause. HD again today but UF is poor due to low BP's on HD. Will add midodrine 10mg  per HD MWF.  ESRD - on HD MWF; patient continuous non-compliance with HD-leaves txs early in outpatient. Tolerated HD here 1/07, HD again today.  UF today only 0.5 L, 1/07 UF not recorded.  Hypertension/volume  - up 3kg, not sure if wts are accurate  Anemia of CKD - Hgb 11.2; ESA not due yet; monitor trends Secondary Hyperparathyroidism - PO4 high-continue binders Nutrition - Renal diet with fluid restriction GOC- patient with multiple co-morbidities and ongoing non-compliance with HD.  Dispo - per pmd   [name], MD 09/09/2021, 2:04 PM   Subjective:   Feeling better overall, no sig SOB today   Objective:   BP 136/78 (BP Location: Left Arm)    Pulse 67    Temp 98.6 F (37 C) (Oral)    Resp 18    Ht 5\' 8"  (1.727 m)    Wt 54.1 kg    SpO2 (!) 89%    BMI 18.13 kg/m   Intake/Output Summary (Last 24 hours) at 09/09/2021 1400 Last data filed at 09/09/2021 0300 Gross per 24 hour  Intake 477  ml  Output 0 ml  Net 477 ml    Weight change:   Physical Exam: Gen: Alert, nad  Neck: No jvd Chest: cta bilat, no rales, dec'd R base Cor: irreg irreg  Abd: soft ntnd no ascites, not tachy  Ext: 1+ bilat pedal edema Neuro: Alert, NF, ox3 Access:  RIJ TDC / RUA BBF + pulse    Labs: BMET Recent Labs  Lab 09/07/21 1741 09/07/21 1750 09/08/21 0214  NA 139 133* 138  K 7.0* 7.0* 4.1  CL 97* 101 98  CO2 25  --  28  GLUCOSE 78 79 94  BUN 46* 68* 21  CREATININE 6.29* 6.30* 3.78*  CALCIUM 10.4*  --  10.0  PHOS  --   --  4.4    CBC Recent Labs  Lab 09/07/21 1741 09/07/21 1750 09/08/21 0214  WBC 5.3  --  5.1  NEUTROABS  --   --  3.6  HGB 9.9* 11.2* 10.1*  HCT 32.7* 33.0* 32.2*  MCV 108.6*  --  107.3*  PLT 151  --  158     Medications:     apixaban  2.5 mg Oral BID   Chlorhexidine Gluconate Cloth  6 each Topical Q0600   dextromethorphan-guaiFENesin  1 tablet  Oral BID   heparin sodium (porcine)       sevelamer carbonate  1,600 mg Oral TID WC   sodium chloride flush  3 mL Intravenous Q12H

## 2021-09-09 NOTE — Progress Notes (Signed)
Patient returned from dialysis. VS taken and are WNL. Patient remains in a-fib. Lab came to room and patient refused. He has ordered lunch and is resting in bed in no distress. Fuller Canada, RN

## 2021-09-10 ENCOUNTER — Inpatient Hospital Stay (HOSPITAL_COMMUNITY): Payer: Medicare Other

## 2021-09-10 DIAGNOSIS — E43 Unspecified severe protein-calorie malnutrition: Secondary | ICD-10-CM | POA: Insufficient documentation

## 2021-09-10 DIAGNOSIS — I4891 Unspecified atrial fibrillation: Secondary | ICD-10-CM

## 2021-09-10 DIAGNOSIS — E875 Hyperkalemia: Secondary | ICD-10-CM

## 2021-09-10 LAB — CBC WITH DIFFERENTIAL/PLATELET
Abs Immature Granulocytes: 0.01 10*3/uL (ref 0.00–0.07)
Basophils Absolute: 0.1 10*3/uL (ref 0.0–0.1)
Basophils Relative: 2 %
Eosinophils Absolute: 0.4 10*3/uL (ref 0.0–0.5)
Eosinophils Relative: 7 %
HCT: 30.6 % — ABNORMAL LOW (ref 39.0–52.0)
Hemoglobin: 9.4 g/dL — ABNORMAL LOW (ref 13.0–17.0)
Immature Granulocytes: 0 %
Lymphocytes Relative: 11 %
Lymphs Abs: 0.6 10*3/uL — ABNORMAL LOW (ref 0.7–4.0)
MCH: 32.6 pg (ref 26.0–34.0)
MCHC: 30.7 g/dL (ref 30.0–36.0)
MCV: 106.3 fL — ABNORMAL HIGH (ref 80.0–100.0)
Monocytes Absolute: 0.3 10*3/uL (ref 0.1–1.0)
Monocytes Relative: 5 %
Neutro Abs: 4.2 10*3/uL (ref 1.7–7.7)
Neutrophils Relative %: 75 %
Platelets: 173 10*3/uL (ref 150–400)
RBC: 2.88 MIL/uL — ABNORMAL LOW (ref 4.22–5.81)
RDW: 16.5 % — ABNORMAL HIGH (ref 11.5–15.5)
WBC: 5.4 10*3/uL (ref 4.0–10.5)
nRBC: 0 % (ref 0.0–0.2)

## 2021-09-10 LAB — T4, FREE: Free T4: 0.89 ng/dL (ref 0.61–1.12)

## 2021-09-10 LAB — BASIC METABOLIC PANEL
Anion gap: 14 (ref 5–15)
BUN: 30 mg/dL — ABNORMAL HIGH (ref 8–23)
CO2: 26 mmol/L (ref 22–32)
Calcium: 10.5 mg/dL — ABNORMAL HIGH (ref 8.9–10.3)
Chloride: 95 mmol/L — ABNORMAL LOW (ref 98–111)
Creatinine, Ser: 4.52 mg/dL — ABNORMAL HIGH (ref 0.61–1.24)
GFR, Estimated: 14 mL/min — ABNORMAL LOW (ref >60)
Glucose, Bld: 96 mg/dL (ref 70–99)
Potassium: 4.2 mmol/L (ref 3.5–5.1)
Sodium: 135 mmol/L (ref 135–145)

## 2021-09-10 LAB — BRAIN NATRIURETIC PEPTIDE
B Natriuretic Peptide: >4500 pg/mL — ABNORMAL HIGH (ref 0.0–100.0)
B Natriuretic Peptide: >4500 pg/mL — ABNORMAL HIGH (ref 0.0–100.0)

## 2021-09-10 LAB — GLUCOSE, CAPILLARY: Glucose-Capillary: 109 mg/dL — ABNORMAL HIGH (ref 70–99)

## 2021-09-10 MED ORDER — MIDODRINE HCL 5 MG PO TABS
10.0000 mg | ORAL_TABLET | Freq: Two times a day (BID) | ORAL | Status: DC
  Administered 2021-09-10 – 2021-09-11 (×3): 10 mg via ORAL
  Filled 2021-09-10 (×5): qty 2

## 2021-09-10 NOTE — Progress Notes (Signed)
PROGRESS NOTE    Thomas Robinson  VEL:[id number] DOB: 1961-03-23 DOA: 09/07/2021 PCP: [name], PA   Brief Narrative: 61 year old with past medical history significant for ESRD on hemodialysis MWF, ascending aortic aneurysm, PEA arrest 25/8527, diastolic heart failure, A. fib, prior CVA, COPD on chronic oxygen 2.5 L presented with shortness of breath and cough.  Patient with recurrent admissions with failure and fluid overload in the setting of noncompliance with hemodialysis.  Last similar admission just last week was discharged on December 20 05/2021.  Patient also developed A. fib with RVR 1/8 and was a started on IV Cardizem.  He was  transition to oral Cardizem 1/09.  He develops worsening Hypoxic Respiratory Failure. Repeated chest x ray worsening pulmonary edema. Limitation with fluid removal with HD due to Hypotension. Cardiology has been consulted, should we use amiodarone instead of Cardizem for A fib. Nephrology planning extra HD 1/10     Assessment & Plan:   Principal Problem:   Hyperkalemia Active Problems:   End stage renal disease (HCC)   Anemia of chronic disease   Elevated troponin   Atrial fibrillation with RVR (HCC)   Acute on chronic diastolic (congestive) heart failure (HCC)   Chronic respiratory failure with hypoxia (HCC)   Fever   Hypotension   Protein-calorie malnutrition, severe   1-Acute on chronic hypoxic respiratory failure secondary to acute on chronic diastolic heart failure exacerbation, pulmonary edema secondary to noncompliance with hemodialysis and limitation of fluid removal with HD.  -On chronic 2.5 L of oxygen--currently on 10 L High flow.  -Chest x-ray repeated 1/10: worsening edema pattern. Persistent effusion, right larger than left.  -He received urgent dialysis on admission however he signed out of earlier. -Received  hemodialysis 1/9 (3 Hours) -Continue with guaifenesin/dextromethorphan.  -Nebulizer/  -Discussed with Dr Jonnie Finner,  limitation with fluid removal during HD due to hypotension. Will consult Cardiology to help with management of A fib. Should we use amiodarone and discontinue Cardizem to have help with hypotension  -Plan for extra HD today.  -Might need Palliative care eval.    2-A. fib RVR Mild elevation of troponin in the setting of A. fib RVR He was a started on Cardizem drip. Now on oral Cardizem.  Continue with Eliquis Cardiology consulted.   3-ESRD on hemodialysis noncompliance with hemodialysis Renal admission for volume overload and hyperkalemia Appreciate  nephrology's assistant.  Anemia of chronic disease: Monitor hb.   Elevation of TSH: He will need  thyroid function test in 4 to 6 weeks Check free t3 and free t 4  Poor memory  from prior history of CVA  See wound care documentation Pressure Injury 07/19/21 Coccyx Posterior;Medial Stage 2 -  Partial thickness loss of dermis presenting as a shallow open injury with a red, pink wound bed without slough. (Active)  07/19/21 1130  Location: Coccyx  Location Orientation: Posterior;Medial  Staging: Stage 2 -  Partial thickness loss of dermis presenting as a shallow open injury with a red, pink wound bed without slough.  Wound Description (Comments):   Present on Admission: Yes     Pressure Injury 07/23/21 Heel Left Deep Tissue Pressure Injury - Purple or maroon localized area of discolored intact skin or blood-filled blister due to damage of underlying soft tissue from pressure and/or shear. (Active)  07/23/21 2241  Location: Heel  Location Orientation: Left  Staging: Deep Tissue Pressure Injury - Purple or maroon localized area of discolored intact skin or blood-filled blister due to damage of underlying soft  tissue from pressure and/or shear.  Wound Description (Comments):   Present on Admission:      Interventions: Ensure Enlive (each supplement provides 350kcal and 20 grams of protein), MVI, Prostat, Liberalize Diet  Estimated  body mass index is 17.9 kg/m as calculated from the following:   Height as of this encounter: 5\' 8"  (1.727 m).   Weight as of this encounter: 53.4 kg.   DVT prophylaxis: Eliquis Code Status: Full code Family Communication: care discussed with patient.  Disposition Plan:  Status is: Inpatient  Remains inpatient appropriate because; admitted with acute on chronic hypoxic respiratory failure secondary to pulmonary edema, A. fib RVR        Consultants:  Nephrology   Procedures:  HD  Antimicrobials:    Subjective: He notice improvement of Dyspnea when RT increase oxygen delivery.  He completed 3 Hour of HD yesterday, he gets chest pressure and cant continue.     Objective: Vitals:   09/09/21 2106 09/10/21 0817 09/10/21 0834 09/10/21 0837  BP:  (!) 148/91    Pulse:  96    Resp:  20    Temp:  98.4 F (36.9 C)    TempSrc:  Oral    SpO2: 92% 98% (!) 80% 90%  Weight:      Height:        Intake/Output Summary (Last 24 hours) at 09/10/2021 1110 Last data filed at 09/10/2021 0051 Gross per 24 hour  Intake 472.38 ml  Output 700 ml  Net -227.62 ml    Filed Weights   09/09/21 0032 09/09/21 0911 09/09/21 1210  Weight: 54.2 kg 54.1 kg 53.4 kg    Examination:  General exam: NAD Respiratory system: BL crackles.  Cardiovascular system: S 1 S 2 IRR Gastrointestinal system: BS present. Soft , nt Central nervous system: alert, follows command Extremities: No edema.    Data Reviewed: I have personally reviewed following labs and imaging studies  CBC: Recent Labs  Lab 09/07/21 1741 09/07/21 1750 09/08/21 0214  WBC 5.3  --  5.1  NEUTROABS  --   --  3.6  HGB 9.9* 11.2* 10.1*  HCT 32.7* 33.0* 32.2*  MCV 108.6*  --  107.3*  PLT 151  --  702    Basic Metabolic Panel: Recent Labs  Lab 09/07/21 1723 09/07/21 1741 09/07/21 1750 09/08/21 0214  NA  --  139 133* 138  K  --  7.0* 7.0* 4.1  CL  --  97* 101 98  CO2  --  25  --  28  GLUCOSE  --  78 79 94  BUN   --  46* 68* 21  CREATININE  --  6.29* 6.30* 3.78*  CALCIUM  --  10.4*  --  10.0  MG 2.7*  --   --  2.2  PHOS  --   --   --  4.4    GFR: Estimated Creatinine Clearance: 15.3 mL/min (A) (by C-G formula based on SCr of 3.78 mg/dL (H)). Liver Function Tests: Recent Labs  Lab 09/07/21 1723 09/08/21 0214  AST 22 18  ALT 18 18  ALKPHOS 97 104  BILITOT 1.3* 1.3*  PROT 7.3 7.1  ALBUMIN 3.2* 3.2*    No results for input(s): LIPASE, AMYLASE in the last 168 hours. No results for input(s): AMMONIA in the last 168 hours. Coagulation Profile: No results for input(s): INR, PROTIME in the last 168 hours. Cardiac Enzymes: Recent Labs  Lab 09/08/21 0213  CKTOTAL 17*    BNP (last 3  results) No results for input(s): PROBNP in the last 8760 hours. HbA1C: No results for input(s): HGBA1C in the last 72 hours. CBG: No results for input(s): GLUCAP in the last 168 hours. Lipid Profile: No results for input(s): CHOL, HDL, LDLCALC, TRIG, CHOLHDL, LDLDIRECT in the last 72 hours. Thyroid Function Tests: Recent Labs    09/08/21 0214  TSH 7.750*    Anemia Panel: No results for input(s): VITAMINB12, FOLATE, FERRITIN, TIBC, IRON, RETICCTPCT in the last 72 hours. Sepsis Labs: Recent Labs  Lab 09/07/21 2044 09/08/21 0213  LATICACIDVEN 1.9 2.4*     Recent Results (from the past 240 hour(s))  Blood culture (routine x 2)     Status: None (Preliminary result)   Collection Time: 09/07/21  5:41 PM   Specimen: BLOOD LEFT ARM  Result Value Ref Range Status   Specimen Description BLOOD LEFT ARM  Final   Special Requests   Final    BOTTLES DRAWN AEROBIC AND ANAEROBIC Blood Culture results may not be optimal due to an inadequate volume of blood received in culture bottles   Culture   Final    NO GROWTH 3 DAYS Performed at [facility], [place] [address], [place], [place] [address]    Report Status PENDING  Incomplete  Blood culture (routine x 2)     Status: None (Preliminary result)    Collection Time: 09/07/21  5:55 PM   Specimen: BLOOD  Result Value Ref Range Status   Specimen Description BLOOD LEFT ANTECUBITAL  Final   Special Requests   Final    BOTTLES DRAWN AEROBIC ONLY Blood Culture results may not be optimal due to an inadequate volume of blood received in culture bottles   Culture   Final    NO GROWTH 3 DAYS Performed at [facility], [place] [address], [place], [place] [address]    Report Status PENDING  Incomplete          Radiology Studies: DG CHEST PORT 1 VIEW  Result Date: 09/10/2021 CLINICAL DATA:  Shortness of breath.  Hypoxemia. EXAM: PORTABLE CHEST 1 VIEW COMPARISON:  09/07/2021 FINDINGS: Central line remains in place with its tip in the SVC just above the right atrium. Persistent cardiomegaly. Worsening of pulmonary edema pattern. Bilateral effusions persist, right larger than left. Coexistent pneumonia not excluded. IMPRESSION: Some worsening of edema pattern. Persistent effusions, right larger than left. Electronically Signed   By: Nelson Chimes M.D.   On: 09/10/2021 10:29        Scheduled Meds:  (feeding supplement) PROSource Plus  30 mL Oral BID BM   apixaban  2.5 mg Oral BID   Chlorhexidine Gluconate Cloth  6 each Topical Q0600   dextromethorphan-guaiFENesin  1 tablet Oral BID   diltiazem  60 mg Oral Q6H   feeding supplement  237 mL Oral BID BM   levalbuterol  0.63 mg Nebulization BID   [START ON 09/11/2021] midodrine  10 mg Oral Q M,W,F-HD   multivitamin  1 tablet Oral QHS   sevelamer carbonate  1,600 mg Oral TID WC   sodium chloride flush  3 mL Intravenous Q12H   Continuous Infusions:  sodium chloride       LOS: 3 days    Time spent: 35 minutes.     Elmarie Shiley, MD [facility]   If 7PM-7AM, please contact night-coverage [email or web address]  09/10/2021, 11:10 AM

## 2021-09-10 NOTE — Progress Notes (Signed)
Patient refused morning labs. Patient was rude to lab.

## 2021-09-10 NOTE — Progress Notes (Signed)
RT came into pt room to give neb treatment, pts Spo2 was 80% on 4L [place]. RT placed pt on 10L  HFNC spo2 94%. Pt tolerating well at this time, RN aware, RT will continue to monitor.

## 2021-09-10 NOTE — Consult Note (Addendum)
Cardiology Consultation:   Patient ID: Thomas Robinson MRN: [id number]; DOB: 09-13-1960  Admit date: 09/07/2021 Date of Consult: 09/10/2021  PCP:  [name], PA   [facility] Providers Cardiologist:  [name], DO       Patient Profile:   Thomas Robinson is a 61 y.o. male with a hx of normal coronary arteries by cath @ [facility] 08/2020, ESRD (frequent noncompliance with HD), COPD with chronic respiratory failure, sacral decubitus ulcers, GIB, anemia, chronic diastolic CHF with fluid management intended to be by dialysis, asthma, CVA, ascending aortic aneurysm (4.2cm by CT 08/2021), PEA arrest 07/2021, paroxysmal atrial fibrillation/flutter, peripheral neuropathy and non compliance who is being seen 09/10/2021 for the evaluation of Atrial fibrillation at the request of Dr. Tyrell Antonio.  The patient has followed up with Dr. Harriet Masson in the past for fluid overload in the context of ESRD unfortunately with patient noncompliance with hemodialysis or establishing with nephrology. He's had numerous admissions for various medical issues but namely respiratory/hypoxia in the context of skipped/incomplete HD. Multiple AMAs.   Hx of atrial flutter (diagnosed 58/8502) complicated by hematochezia and GIB (07/2021), PEA arrest after sedation for AVF procedure (07/2021), septic shock, thrombocytopenia, hypotension requiring midodrine, and difficult to control HRs. We had seen also him for prior volume overload in the setting of cor pulomonale type of picture. Per Dr. Kathalene Frames consult note in 07/2021, the patient was felt to be a poor candidate for any future invasive procedures due to inability to safely tolerate sedation without recurrent cardiac arrest.  Last echo 08/12/2021 showed EF 60-65%, grade 1 Dd, mildly enlarged RV, severe pulm HTN with severe biatrial enlargement, small pericardial effusion, mild-moderate MR/TR, small ascending aortic aneurysm 4.2cm, LVOT obstruction felt due to compression from the RV  on the LV, not HCM.  Most recently seen 08/28/2021 for assistance with management of atrial fib given that beta blocker had to be stopped due to hypotension. Did not felt a candidate for DCCV due to noncompliance with anticoagulation, not felt to be able to tolerate more invasive procedures due to h/o cardiac arrest with sedation for unrelated procedure. No amiodarone due to prolonged QT. HR was stable. Recommended to re-challenge with metoprolol 12.5mg  BID if BP improves - consider midodrine for hypotension as he previously was on this. Discharged on 08/29/21.   History of Present Illness:   Mr. [name] presented 09/07/2021 with few days hx of cough, shortness of breath, fatigue and feeling sick. Left dialysis tx early on each occasion after last discharge. Admitted for volume overload in setting of non compliance with HD. Patient was treated with IV cardizem for afib RVR. Heart rate improved and switched to cardizem 60mg  q 6 hours.  However due to worsening pulmonary edema and persistent effusion requiring additional diuresis and cardiology is asked for afib management given hypotension. Now started on midodrine for blood pressure support. Required HFNC at 10L this morning.   TSH elevated at 7.7 Normal free T4 Pending free T3    Past Medical History:  Diagnosis Date   Anaphylactic shock, unspecified, sequela 06/10/2019   Aortic insufficiency    Aortic stenosis    Arthritis    Asthma, chronic, unspecified asthma severity, with acute exacerbation 10/23/2017   CAP (community acquired pneumonia) 10/23/2017   Carpal tunnel syndrome of right wrist 10/05/2018   Cataract    right - removed by surgery   Cerebral infarction due to thrombosis of cerebral artery (HCC)    Cervical disc herniation 01/04/2020   Chronic diastolic heart  failure ([place]) 04/13/2018   Chronic low back pain 11/24/2019   Constipation    COPD (chronic obstructive pulmonary disease) (HCC)    Cough    chronic cough   Encounter  for immunization 07/08/2017   ESRD on hemodialysis ([place]) 02/16/2018   Fall 06/04/2019   GERD (gastroesophageal reflux disease) 06/04/2019   GI bleeding 05/24/2017   Gram-negative sepsis, unspecified ([place]) 09/05/2017   History of fusion of cervical spine 03/26/2020   Hyperlipidemia    Hypertension    Hypokalemia 06/04/2017   Hypotension    Iron deficiency anemia, unspecified 09/09/2017   Left shoulder pain 11/11/2019   Lumbar radiculopathy 11/24/2019   Lung contusion 06/04/2019   LVH (left ventricular hypertrophy) due to hypertensive disease, with heart failure ([place]) 06/18/2020   Macrocytic anemia 05/24/2017   Mitral regurgitation    Moderate protein-calorie malnutrition (HCC) 06/06/2017   Myofascial pain syndrome 06/12/2020   Neuritis of right ulnar nerve 09/13/2018   Non-compliance with renal dialysis ([place]) 02/08/2020   Oxygen deficiency 12/30/2019   O2 sats on RA 87% at PAT appt    Pain in joint of right elbow 09/13/2018   Paroxysmal atrial flutter (HCC)    PEA (Pulseless electrical activity) ([place])    after sedation in 07/2021   Pulmonary hypertension ([place])    Renovascular hypertension 06/22/2020   Restless leg syndrome    Rib fractures 06/2019   Right   S/P cardiac cath 08/27/2020   normal coronary arteries.   Secondary hyperparathyroidism of renal origin ([place]) 06/04/2017   Thoracic ascending aortic aneurysm    Tricuspid regurgitation    Ulnar neuropathy 10/05/2018   Volume overload 10/23/2017    Past Surgical History:  Procedure Laterality Date   A/V FISTULAGRAM N/A 01/01/2021   Procedure: A/V FISTULAGRAM;  Surgeon: Serafina Mitchell, MD;  Location: [place] [facility];  Service: Cardiovascular;  Laterality: N/A;   A/V FISTULAGRAM N/A 01/15/2021   Procedure: A/V [id number];  Surgeon: Serafina Mitchell, MD;  Location: [place] [facility];  Service: Cardiovascular;  Laterality: N/A;   ANTERIOR CERVICAL DECOMP/DISCECTOMY FUSION N/A 01/04/2020   Procedure: ANTERIOR CERVICAL  DECOMPRESSION/DISCECTOMY FUSION CERVICAL FIVE THROUGH SEVEN;  Surgeon: Melina Schools, MD;  Location: [place];  Service: Orthopedics;  Laterality: N/A;  3 hrs   AV FISTULA PLACEMENT Left 05/28/2017   Procedure: LEFT ARM ARTERIOVENOUS (AV) FISTULA CREATION;  Surgeon: Conrad Blue Ridge, MD;  Location: [place];  Service: Vascular;  Laterality: Left;   AV FISTULA PLACEMENT Left 02/02/2021   Procedure: Debride left forearm, ligation of left upper arm Aretiovenous fistula;  Surgeon: Elam Dutch, MD;  Location: [place];  Service: Vascular;  Laterality: Left;   AV FISTULA PLACEMENT Right 04/04/2021   Procedure: RIGHT ARTERIOVENOUS (AV) FISTULA CREATION;  Surgeon: Serafina Mitchell, MD;  Location: [place] [facility];  Service: Vascular;  Laterality: Right;   BIOPSY  07/10/2021   Procedure: BIOPSY;  Surgeon: Lavena Bullion, DO;  Location: [place] [facility];  Service: Gastroenterology;;   COLONOSCOPY WITH PROPOFOL N/A 07/10/2021   Procedure: COLONOSCOPY WITH PROPOFOL;  Surgeon: Lavena Bullion, DO;  Location: [place];  Service: Gastroenterology;  Laterality: N/A;   ESOPHAGOGASTRODUODENOSCOPY (EGD) WITH PROPOFOL N/A 07/10/2021   Procedure: ESOPHAGOGASTRODUODENOSCOPY (EGD) WITH PROPOFOL;  Surgeon: Lavena Bullion, DO;  Location: [place];  Service: Gastroenterology;  Laterality: N/A;   EYE SURGERY Right 06/02/2019   Cataract removed   FRACTURE SURGERY     INSERTION OF DIALYSIS CATHETER Right 02/02/2021   Procedure: INSERTION OF Right  Internal jugular TUNNELED  DIALYSIS CATHETER;  Surgeon: Elam Dutch, MD;  Location: [facility];  Service: Vascular;  Laterality: Right;   IR DIALY SHUNT INTRO NEEDLE/INTRACATH INITIAL W/IMG LEFT Left 09/12/2020   IR FLUORO GUIDE CV LINE RIGHT  05/25/2017   IR FLUORO GUIDE CV LINE RIGHT  07/17/2021   IR US GUIDE VASC ACCESS LEFT  09/12/2020   IR US GUIDE VASC ACCESS RIGHT  05/25/2017   IR US GUIDE VASC ACCESS RIGHT  07/17/2021   LIGATION OF ARTERIOVENOUS  FISTULA Left 01/10/2021   Procedure:  LIGATION OF LEFT ARM RADIOCEPHALIC FISTULA;  Surgeon: Serafina Mitchell, MD;  Location: [place];  Service: Vascular;  Laterality: Left;   PERIPHERAL VASCULAR BALLOON ANGIOPLASTY Left 01/15/2021   Procedure: PERIPHERAL VASCULAR BALLOON ANGIOPLASTY;  Surgeon: Serafina Mitchell, MD;  Location: [place] [facility];  Service: Cardiovascular;  Laterality: Left;  AVF   REVISON OF ARTERIOVENOUS FISTULA Left 07/18/2019   Procedure: REVISION PLICATION OF RADIOCEPHALIC ARTERIOVENOUS FISTULA LEFT ARM;  Surgeon: Angelia Mould, MD;  Location: [facility] OR;  Service: Vascular;  Laterality: Left;   REVISON OF ARTERIOVENOUS FISTULA Left 01/10/2021   Procedure: CONVERSION TO BRACHIOCEPHALIC ARTERIOVENOUS FISTULA;  Surgeon: Serafina Mitchell, MD;  Location: [facility];  Service: Vascular;  Laterality: Left;   RINOPLASTY      Inpatient Medications: Scheduled Meds:  (feeding supplement) PROSource Plus  30 mL Oral BID BM   apixaban  2.5 mg Oral BID   Chlorhexidine Gluconate Cloth  6 each Topical Q0600   dextromethorphan-guaiFENesin  1 tablet Oral BID   diltiazem  60 mg Oral Q6H   feeding supplement  237 mL Oral BID BM   levalbuterol  0.63 mg Nebulization BID   midodrine  10 mg Oral BID WC   multivitamin  1 tablet Oral QHS   sevelamer carbonate  1,600 mg Oral TID WC   sodium chloride flush  3 mL Intravenous Q12H   Continuous Infusions:  sodium chloride     PRN Meds: sodium chloride, acetaminophen **OR** acetaminophen, albuterol, HYDROcodone-acetaminophen, sevelamer carbonate, sodium chloride flush  Allergies:    Allergies  Allergen Reactions   Vancomycin Shortness Of Breath and Itching    Social History:   Social History   Socioeconomic History   Marital status: Single    Spouse name: Not on file   Number of children: Not on file   Years of education: Not on file   Highest education level: Not on file  Occupational History   Not on file  Tobacco Use   Smoking status: Never   Smokeless tobacco: Never   Vaping Use   Vaping Use: Never used  Substance and Sexual Activity   Alcohol use: Not Currently    Alcohol/week: 0.0 standard drinks    Comment: has a drink once in a while- 12/30/19   Drug use: No   Sexual activity: Not Currently  Other Topics Concern   Not on file  Social History Narrative   Lives with a roommate in a one story home.  Has 1 child.  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  Education: high school.   Social Determinants of Health   Financial Resource Strain: Not on file  Food Insecurity: Not on file  Transportation Needs: Not on file  Physical Activity: Not on file  Stress: Not on file  Social Connections: Not on file  Intimate Partner Violence: Not on file    Family History:   Family History  Problem Relation  Age of Onset   Cancer Mother      ROS:  Please see the history of present illness.  All other ROS reviewed and negative.     Physical Exam/Data:   Vitals:   09/10/21 0817 09/10/21 0834 09/10/21 0837 09/10/21 1125  BP: (!) 148/91   129/82  Pulse: 96   88  Resp: 20   16  Temp: 98.4 F (36.9 C)     TempSrc: Oral   Oral  SpO2: 98% (!) 80% 90% 98%  Weight:      Height:        Intake/Output Summary (Last 24 hours) at 09/10/2021 1238 Last data filed at 09/10/2021 0051 Gross per 24 hour  Intake 472.38 ml  Output --  Net 472.38 ml   Last 3 Weights 09/09/2021 09/09/2021 09/09/2021  Weight (lbs) 117 lb 11.6 oz 119 lb 4.3 oz 119 lb 7.8 oz  Weight (kg) 53.4 kg 54.1 kg 54.2 kg     Body mass index is 17.9 kg/m.  General: Thin frail ill-appearing male in no acute distress HEENT: normal Neck: no JVD Vascular: No carotid bruits; Distal pulses 2+ bilaterally Cardiac:  normal S1, S2; irregularly irregular; no murmur  Lungs: Diminished breath sounds with rales  abd: soft, nontender, no hepatomegaly  Ext: no edema Musculoskeletal:  No deformities, BUE and BLE strength normal and equal Skin: warm and dry  Neuro:  CNs 2-12 intact, no focal abnormalities  noted Psych:  Normal affect   EKG:  The EKG was personally reviewed and demonstrates: Atrial fibrillation, heart rate 135, QTc 510 ms Telemetry:  Telemetry was personally reviewed and demonstrates: Atrial fibrillation at a heart rate of 80s  Relevant CV Studies:  Echo 08/12/2021 1. LVOT outflow obstruction. Left ventricular ejection fraction, by  estimation, is 60 to 65%. The left ventricle has normal function. There is  severe left ventricular hypertrophy. Left ventricular diastolic parameters  are consistent with Grade I  diastolic dysfunction (impaired relaxation).   2. The right ventricular size is mildly enlarged. Mildly increased right  ventricular wall thickness. There is severely elevated pulmonary artery  systolic pressure.   3. Left atrial size was severely dilated.   4. Right atrial size was severely dilated.   5. A small pericardial effusion is present. There is no evidence of  cardiac tamponade.   6. No evidence of mitral valve regurgitation.   7. Tricuspid valve regurgitation is mild to moderate.   8. The aortic valve is calcified. Aortic valve regurgitation is mild to  moderate.   9. Aortic Small ascending aortic aneurysm 4.2 cm.  10. The inferior vena cava is dilated in size with <50% respiratory  variability, suggesting right atrial pressure of 15 mmHg.   Conclusion(s)/Recommendation(s): Compared to prior echo 07/19/2021, no  significant differences.   Laboratory Data:  High Sensitivity Troponin:   Recent Labs  Lab 08/27/21 1852 08/27/21 2006 08/27/21 2232 09/07/21 1723 09/08/21 0213  TROPONINIHS 123* 127* 116* 78* 80*     Chemistry Recent Labs  Lab 09/07/21 1723 09/07/21 1741 09/07/21 1741 09/07/21 1750 09/08/21 0214 09/10/21 1122  NA  --  139   < > 133* 138 135  K  --  7.0*   < > 7.0* 4.1 4.2  CL  --  97*   < > 101 98 95*  CO2  --  25  --   --  28 26  GLUCOSE  --  78   < > 79 94 96  BUN  --  46*   < > 68* 21 30*  CREATININE  --  6.29*   <  > 6.30* 3.78* 4.52*  CALCIUM  --  10.4*  --   --  10.0 10.5*  MG 2.7*  --   --   --  2.2  --   GFRNONAA  --  9*  --   --  17* 14*  ANIONGAP  --  17*  --   --  12 14   < > = values in this interval not displayed.    Recent Labs  Lab 09/07/21 1723 09/08/21 0214  PROT 7.3 7.1  ALBUMIN 3.2* 3.2*  AST 22 18  ALT 18 18  ALKPHOS 97 104  BILITOT 1.3* 1.3*   Hematology Recent Labs  Lab 09/07/21 1741 09/07/21 1750 09/08/21 0214 09/10/21 1122  WBC 5.3  --  5.1 5.4  RBC 3.01*  --  3.00* 2.88*  HGB 9.9* 11.2* 10.1* 9.4*  HCT 32.7* 33.0* 32.2* 30.6*  MCV 108.6*  --  107.3* 106.3*  MCH 32.9  --  33.7 32.6  MCHC 30.3  --  31.4 30.7  RDW 17.3*  --  17.3* 16.5*  PLT 151  --  158 173   Thyroid  Recent Labs  Lab 09/08/21 0214 09/10/21 1122  TSH 7.750*  --   FREET4  --  0.89    BNP Recent Labs  Lab 09/07/21 1723 09/08/21 0213 09/10/21 1038  BNP 4,047.2* 4,138.6* >4,500.0*    Radiology/Studies:  DG CHEST PORT 1 VIEW  Result Date: 09/10/2021 CLINICAL DATA:  Shortness of breath.  Hypoxemia. EXAM: PORTABLE CHEST 1 VIEW COMPARISON:  09/07/2021 FINDINGS: Central line remains in place with its tip in the SVC just above the right atrium. Persistent cardiomegaly. Worsening of pulmonary edema pattern. Bilateral effusions persist, right larger than left. Coexistent pneumonia not excluded. IMPRESSION: Some worsening of edema pattern. Persistent effusions, right larger than left. Electronically Signed   By: Nelson Chimes M.D.   On: 09/10/2021 10:29   DG Chest Port 1 View  Result Date: 09/07/2021 CLINICAL DATA:  Weakness and shortness of breath. EXAM: PORTABLE CHEST 1 VIEW COMPARISON:  Most recent radiograph 08/28/2021. Chest CTA 08/26/2021 FINDINGS: Right-sided dialysis catheter unchanged in position. Cardiomegaly is stable. Increasing right pleural effusion and basilar atelectasis. Increasing pulmonary edema. No pneumothorax. Stable osseous structures. IMPRESSION: 1. Increasing right pleural  effusion and basilar atelectasis. 2. Increasing pulmonary edema. Electronically Signed   By: Keith Rake M.D.   On: 09/07/2021 17:58     Assessment and Plan:   Atrial ibrillation with rapid ventricular rate -Heart rate was elevated in 130s during admission in setting of volume overload due to noncompliance with dialysis.  Treated with IV Cardizem and switched to p.o. Cardizem 60 mg every 6 hours with heart rate currently in 80s.  However, patient requires dialysis for volume management.  Hypotension limiting her dialysis.  -Agree with adding midodrine (previously was on this) -Could consider rate control agent on nondialysis days -Other option would be amiodarone however QTC 510 ms -Patient is also noncompliant with Eliquis in outpatient setting.  More likely to avoid chemical conversation in setting of noncompliance.  Not a good candidate for cardioversion.  2.  Acute on chronic hypoxic respiratory failure in setting of noncompliance with dialysis 3.  Acute on chronic diastolic heart failure 4.  End-stage renal disease on hemodialysis -Patient require additional dialysis given worsening edema pattern on chest x-ray this morning -As above  MD to see.  Risk Assessment/Risk Scores:    New York Heart Association (NYHA) Functional Class NYHA Class III  CHA2DS2-VASc Score = 5   This indicates a 7.2% annual risk of stroke. The patient's score is based upon: CHF History: 1 HTN History: 1 Diabetes History: 0 Stroke History: 2 Vascular Disease History: 1 Age Score: 0 Gender Score: 0   For questions or updates, please contact [facility] Please consult [email or web address] for contact info under    Jarrett Soho, PA  09/10/2021 12:38 PM   ATTENDING ATTESTATION:  After conducting a review of all available clinical information with the care team, interviewing the patient, and performing a physical exam, I agree with the findings and plan described in this note.    GEN: No acute distress.   Cardiac: RRR, no murmurs, rubs, or gallops.  Respiratory: Decreased breath sounds at the bases GI: Soft, nontender, non-distended  MS: No edema; No deformity. Neuro:  Nonfocal   Patient is a 61 year old male with a history of end-stage renal disease, history of normal coronary arteries on recent cardiac catheterization, COPD, GI bleed, atrial fibrillation, and medical noncompliance who was admitted with acute on chronic diastolic heart failure after missing dialysis.  We are consulted in regards to hypotension and atrial fibrillation during dialysis.  The patient has had multiple admissions and there have been multiple cardiology consults for this issue.  Currently the patient is feeling well, not short of breath, and his heart rates are in the 80s to 90s.  At this point in time we agree with midodrine to help with his blood pressure.  We will continue his p.o. Cardizem that is helping control his rates as the IV Cardizem was resulting in hypotension.  We may need to only dose him on nondialysis days.  Given his elevated QTC amiodarone may not be ideal.  Additionally there is risk in the patient being noncompliant with his anticoagulation in terms of chemical or electrical cardioversion.  I do not think a TEE cardioversion for this reason would be helpful given the patient is noncompliant with medical therapy.  Lenna Sciara, MD Pager [phone]

## 2021-09-10 NOTE — Progress Notes (Signed)
Pt receives out-pt HD at [facility] on MWF. Pt arrives at 11:00 for 11:30 chair time. Will assist as needed.  Melven Sartorius Renal Navigator [phone]

## 2021-09-10 NOTE — Plan of Care (Signed)
  Problem: Health Behavior/Discharge Planning: Goal: Ability to manage health-related needs will improve Outcome: Not Progressing   

## 2021-09-10 NOTE — Progress Notes (Signed)
[facility] Progress Note   61 y.o. male HTN, dCHF, CVA, ascending aortic aneurysm, PEA arrest 07/2021, aflutter, COPD on 2.5L O2 at home, ESRD with history of severe [place]. He's had many admissions for hypoxia in the setting of shortened or missed dialysis treatments. Last  treatments in the hospital were on 12/27 and 12/28; last outpatient treatments at SW were on 1/2 and 1/5 signing off early each time. 2hr82min and 2hr 46min, . Here w/ generalized aching, chest pain and whole body itching.  OP HD:  [facility] MWF 3.5h  400/500  51.5 kg  2/2 bath  RIJ TDC/  L AVF unusable  - edw appears to be 51.5-52 kg, did leave above his EDW last HD  - mircera 150 ug q2 last 12/21  - venofer 100mg  IV weekly    CXR 1/07 - IMPRESSION: 1. Increasing right pleural effusion and basilar atelectasis. 2. Increasing pulmonary edema.    Assessment/ Plan:   AHRF/ vol overload - last ECHO 08/12/21 EF 60-65%. Vol overload +/-  pleural effusions worsening. Poor UF w/ HD x 2 here due to signing off and hypotension. Will need additional HD today. Adding midodrine for low BP's on HD. This is not a good situation, typically w/ vol overload we can stabilize the patient w/ 1-2 inpt HD sessions, which has not happened here.  ESRD - on HD MWF; patient continuous non-compliance with outpt and inpt dialysis.  Extra HD today for worsening resp failure/ ^nasal O2 BP/ volume - have started midodrine 10 bid. Mild vol overload on exam Anemia of CKD - Hgb 11.2; ESA not due yet; monitor trends Secondary Hyperparathyroidism - PO4 high-continue binders Nutrition - Renal diet with fluid restriction GOC- patient with multiple co-morbidities and poor prognosis. Have d/w the patient in the past, have not brought this up again with him this admission yet.     [name], MD 09/10/2021, 11:34 AM   Subjective:   Feeling better overall, no sig SOB today   Objective:   BP 129/82 (BP Location: Left Arm)    Pulse 88    Temp 98.4  F (36.9 C) (Oral)    Resp 16    Ht 5\' 8"  (1.727 m)    Wt 53.4 kg    SpO2 98%    BMI 17.90 kg/m   Intake/Output Summary (Last 24 hours) at 09/10/2021 1134 Last data filed at 09/10/2021 0051 Gross per 24 hour  Intake 472.38 ml  Output 700 ml  Net -227.62 ml    Weight change: -0.1 kg  Physical Exam: Gen: Alert, nad  Neck: No jvd Chest: cta bilat, no rales, dec'd R base Cor: irreg irreg  Abd: soft ntnd no ascites, not tachy  Ext: 1+ bilat pedal edema Neuro: Alert, NF, ox3 Access:  RIJ TDC / RUA BBF + pulse    Labs: BMET Recent Labs  Lab 09/07/21 1741 09/07/21 1750 09/08/21 0214  NA 139 133* 138  K 7.0* 7.0* 4.1  CL 97* 101 98  CO2 25  --  28  GLUCOSE 78 79 94  BUN 46* 68* 21  CREATININE 6.29* 6.30* 3.78*  CALCIUM 10.4*  --  10.0  PHOS  --   --  4.4    CBC Recent Labs  Lab 09/07/21 1741 09/07/21 1750 09/08/21 0214  WBC 5.3  --  5.1  NEUTROABS  --   --  3.6  HGB 9.9* 11.2* 10.1*  HCT 32.7* 33.0* 32.2*  MCV 108.6*  --  107.3*  PLT 151  --  158     Medications:     (feeding supplement) PROSource Plus  30 mL Oral BID BM   apixaban  2.5 mg Oral BID   Chlorhexidine Gluconate Cloth  6 each Topical Q0600   dextromethorphan-guaiFENesin  1 tablet Oral BID   diltiazem  60 mg Oral Q6H   feeding supplement  237 mL Oral BID BM   levalbuterol  0.63 mg Nebulization BID   [START ON 09/11/2021] midodrine  10 mg Oral Q M,W,F-HD   multivitamin  1 tablet Oral QHS   sevelamer carbonate  1,600 mg Oral TID WC   sodium chloride flush  3 mL Intravenous Q12H

## 2021-09-10 NOTE — Progress Notes (Signed)
OT Cancellation Note  Patient Details Name: Thomas Robinson MRN: [id number] DOB: 08-Jan-1961   Cancelled Treatment:    Reason Eval/Treat Not Completed: Other (comment) (Pt declining to move at this time as he has not slept. Reports he will walk when he wants later. Pt agreeable to therapy signing off. Please reconsult is status changes.)  [place], OTR/L Acute Rehab Pager: [phone] Office: [phone] 09/10/2021, 2:25 PM

## 2021-09-10 NOTE — Progress Notes (Signed)
OT Cancellation Note  Patient Details Name: Jeryl Umholtz MRN: [id number] DOB: [birth year]   Cancelled Treatment:    Reason Eval/Treat Not Completed: Patient declined, no reason specified ("I didnt sleep well. I dont want to get out of this warm bed." Will return as schedule allows.)  [place], OTR/L Acute Rehab Pager: [phone] Office: [phone] 09/10/2021, 11:16 AM

## 2021-09-10 NOTE — Progress Notes (Signed)
PT Cancellation Note  Patient Details Name: Thomas Robinson MRN: [id number] DOB: [birth year]   Cancelled Treatment:    Reason Eval/Treat Not Completed: Pt continues to refuse OOB mobility since he has not slept; reports he will walk when he wants later. Pt agreeable to therapy signing off. Please reconsult if new needs arise and pt agreeable to participate.  Mabeline Caras, PT, DPT Acute Rehabilitation Services  Pager [phone] Office [place] 09/10/2021, 2:27 PM

## 2021-09-10 NOTE — Progress Notes (Signed)
Pt transferred to 5W02 per SWOT.  Report called to receiving RN.

## 2021-09-10 NOTE — Progress Notes (Signed)
Patient is refusing to go to dialysis today.  States he will go tomorrow.

## 2021-09-11 ENCOUNTER — Inpatient Hospital Stay (HOSPITAL_COMMUNITY): Payer: Medicare Other

## 2021-09-11 DIAGNOSIS — J90 Pleural effusion, not elsewhere classified: Secondary | ICD-10-CM

## 2021-09-11 HISTORY — PX: IR THORACENTESIS ASP PLEURAL SPACE W/IMG GUIDE: IMG5380

## 2021-09-11 LAB — GRAM STAIN

## 2021-09-11 LAB — BODY FLUID CELL COUNT WITH DIFFERENTIAL
Eos, Fluid: 2 %
Lymphs, Fluid: 75 %
Monocyte-Macrophage-Serous Fluid: 2 % — ABNORMAL LOW (ref 50–90)
Neutrophil Count, Fluid: 21 % (ref 0–25)
Total Nucleated Cell Count, Fluid: 45 cu mm (ref 0–1000)

## 2021-09-11 LAB — PROTEIN, PLEURAL OR PERITONEAL FLUID: Total protein, fluid: <3 g/dL

## 2021-09-11 LAB — T3, FREE: T3, Free: 1.2 pg/mL — ABNORMAL LOW (ref 2.0–4.4)

## 2021-09-11 LAB — LACTATE DEHYDROGENASE, PLEURAL OR PERITONEAL FLUID: LD, Fluid: 48 U/L — ABNORMAL HIGH (ref 3–23)

## 2021-09-11 LAB — GLUCOSE, CAPILLARY: Glucose-Capillary: 75 mg/dL (ref 70–99)

## 2021-09-11 LAB — BRAIN NATRIURETIC PEPTIDE: B Natriuretic Peptide: 4395.2 pg/mL — ABNORMAL HIGH (ref 0.0–100.0)

## 2021-09-11 MED ORDER — LIDOCAINE HCL (PF) 1 % IJ SOLN
INTRAMUSCULAR | Status: DC | PRN
  Administered 2021-09-11: 5 mL

## 2021-09-11 MED ORDER — GUAIFENESIN-DM 100-10 MG/5ML PO SYRP
10.0000 mL | ORAL_SOLUTION | Freq: Once | ORAL | Status: AC
  Administered 2021-09-11: 10 mL via ORAL
  Filled 2021-09-11: qty 10

## 2021-09-11 MED ORDER — LIDOCAINE HCL 1 % IJ SOLN
INTRAMUSCULAR | Status: AC
  Filled 2021-09-11: qty 20

## 2021-09-11 MED ORDER — HEPARIN SODIUM (PORCINE) 1000 UNIT/ML IJ SOLN
INTRAMUSCULAR | Status: AC
  Administered 2021-09-11: 1000 [IU]
  Filled 2021-09-11: qty 4

## 2021-09-11 NOTE — Progress Notes (Signed)
Pt report received from HD 3 L fluid removed.  Pt back to room at 2140 and VS taken, pt connected to wall oxygen.  Blood glucose 75.  Heating frozen dinner for patient.

## 2021-09-11 NOTE — Procedures (Addendum)
PROCEDURE SUMMARY:  Successful image-guided right thoracentesis. Yielded 1.3L of hazy light brown fluid. Pt tolerated procedure well. No immediate complications. EBL = trace   Specimen was sent for labs. CXR ordered.  Please see imaging section of Epic for full dictation. Left pleural space visualized, no window for thoracentesis.   Armando Gang Adeola Dennen PA-C 09/11/2021 1:04 PM

## 2021-09-11 NOTE — Care Management Important Message (Signed)
Important Message  Patient Details  Name: Thomas Robinson MRN: [id number] Date of Birth: [birth year]   Medicare Important Message Given:  Yes     Orbie Pyo 09/11/2021, 3:32 PM

## 2021-09-11 NOTE — Progress Notes (Signed)
Progress Note  Patient Name: Thomas Robinson Date of Encounter: 09/11/2021  Primary Cardiologist: [name], DO   Subjective   Patient was seen and examined at his bedside.   Inpatient Medications    Scheduled Meds:  (feeding supplement) PROSource Plus  30 mL Oral BID BM   apixaban  2.5 mg Oral BID   Chlorhexidine Gluconate Cloth  6 each Topical Q0600   dextromethorphan-guaiFENesin  1 tablet Oral BID   diltiazem  60 mg Oral Q6H   feeding supplement  237 mL Oral BID BM   levalbuterol  0.63 mg Nebulization BID   lidocaine       midodrine  10 mg Oral BID WC   multivitamin  1 tablet Oral QHS   sevelamer carbonate  1,600 mg Oral TID WC   sodium chloride flush  3 mL Intravenous Q12H   Continuous Infusions:  sodium chloride     PRN Meds: sodium chloride, acetaminophen **OR** acetaminophen, albuterol, HYDROcodone-acetaminophen, lidocaine (PF), sevelamer carbonate, sodium chloride flush   Vital Signs    Vitals:   09/10/21 2300 09/11/21 0542 09/11/21 0753 09/11/21 1100  BP: 120/75 117/70 (!) 142/76 131/79  Pulse: 99 81 85 80  Resp: 20 18 20  (!) 22  Temp: 98.1 F (36.7 C) 98.7 F (37.1 C) 98.3 F (36.8 C) 98.1 F (36.7 C)  TempSrc: Oral Oral Oral Oral  SpO2: 90% 91% 90% 90%  Weight:      Height:        Intake/Output Summary (Last 24 hours) at 09/11/2021 1302 Last data filed at 09/11/2021 0700 Gross per 24 hour  Intake 120 ml  Output --  Net 120 ml   Filed Weights   09/09/21 0032 09/09/21 0911 09/09/21 1210  Weight: 54.2 kg 54.1 kg 53.4 kg    Telemetry    Atrial fibirllation - Personally Reviewed  ECG    None today  - Personally Reviewed  Physical Exam   General: Comfortable Head: Atraumatic, normal size  Eyes: PEERLA, EOMI  Neck: Supple, normal JVD Cardiac: Normal S1, S2; RRR; no murmurs, rubs, or gallops Lungs: Clear to auscultation bilaterally Abd: Soft, nontender, no hepatomegaly  Ext: warm, no edema Musculoskeletal: No deformities, BUE and  BLE strength normal and equal Skin: Warm and dry, no rashes   Neuro: Alert and oriented to person, place, time, and situation, CNII-XII grossly intact, no focal deficits  Psych: Normal mood and affect   Labs    Chemistry Recent Labs  Lab 09/07/21 1723 09/07/21 1741 09/07/21 1741 09/07/21 1750 09/08/21 0214 09/10/21 1122  NA  --  139   < > 133* 138 135  K  --  7.0*   < > 7.0* 4.1 4.2  CL  --  97*   < > 101 98 95*  CO2  --  25  --   --  28 26  GLUCOSE  --  78   < > 79 94 96  BUN  --  46*   < > 68* 21 30*  CREATININE  --  6.29*   < > 6.30* 3.78* 4.52*  CALCIUM  --  10.4*  --   --  10.0 10.5*  PROT 7.3  --   --   --  7.1  --   ALBUMIN 3.2*  --   --   --  3.2*  --   AST 22  --   --   --  18  --   ALT 18  --   --   --  18  --   ALKPHOS 97  --   --   --  104  --   BILITOT 1.3*  --   --   --  1.3*  --   GFRNONAA  --  9*  --   --  17* 14*  ANIONGAP  --  17*  --   --  12 14   < > = values in this interval not displayed.     Hematology Recent Labs  Lab 09/07/21 1741 09/07/21 1750 09/08/21 0214 09/10/21 1122  WBC 5.3  --  5.1 5.4  RBC 3.01*  --  3.00* 2.88*  HGB 9.9* 11.2* 10.1* 9.4*  HCT 32.7* 33.0* 32.2* 30.6*  MCV 108.6*  --  107.3* 106.3*  MCH 32.9  --  33.7 32.6  MCHC 30.3  --  31.4 30.7  RDW 17.3*  --  17.3* 16.5*  PLT 151  --  158 173    Cardiac EnzymesNo results for input(s): TROPONINI in the last 168 hours. No results for input(s): TROPIPOC in the last 168 hours.   BNP Recent Labs  Lab 09/10/21 1038 09/10/21 1631 09/11/21 0608  BNP >4,500.0* >4,500.0* 4,395.2*     DDimer No results for input(s): DDIMER in the last 168 hours.   Radiology    DG CHEST PORT 1 VIEW  Result Date: 09/10/2021 CLINICAL DATA:  Shortness of breath.  Hypoxemia. EXAM: PORTABLE CHEST 1 VIEW COMPARISON:  09/07/2021 FINDINGS: Central line remains in place with its tip in the SVC just above the right atrium. Persistent cardiomegaly. Worsening of pulmonary edema pattern. Bilateral  effusions persist, right larger than left. Coexistent pneumonia not excluded. IMPRESSION: Some worsening of edema pattern. Persistent effusions, right larger than left. Electronically Signed   By: Nelson Chimes M.D.   On: 09/10/2021 10:29    Cardiac Studies   Echo 08/12/2021 1. LVOT outflow obstruction. Left ventricular ejection fraction, by  estimation, is 60 to 65%. The left ventricle has normal function. There is  severe left ventricular hypertrophy. Left ventricular diastolic parameters  are consistent with Grade I  diastolic dysfunction (impaired relaxation).   2. The right ventricular size is mildly enlarged. Mildly increased right  ventricular wall thickness. There is severely elevated pulmonary artery  systolic pressure.   3. Left atrial size was severely dilated.   4. Right atrial size was severely dilated.   5. A small pericardial effusion is present. There is no evidence of  cardiac tamponade.   6. No evidence of mitral valve regurgitation.   7. Tricuspid valve regurgitation is mild to moderate.   8. The aortic valve is calcified. Aortic valve regurgitation is mild to  moderate.   9. Aortic Small ascending aortic aneurysm 4.2 cm.  10. The inferior vena cava is dilated in size with <50% respiratory  variability, suggesting right atrial pressure of 15 mmHg.   Conclusion(s)/Recommendation(s): Compared to prior echo 07/19/2021, no  significant differences.   Patient Profile     61 y.o. male  normal coronary arteries by cath @ [facility] 08/2020, ESRD (frequent noncompliance with HD), COPD with chronic respiratory failure, sacral decubitus ulcers, GIB, anemia, chronic diastolic CHF with fluid management intended to be by dialysis, asthma, CVA, ascending aortic aneurysm (4.2cm by CT 08/2021), PEA arrest 07/2021, paroxysmal atrial fibrillation/flutter, peripheral neuropathy and non compliance who was admitted for acute exacerbation of heart failure.  Assessment & Plan    Atrial  fibrillation with rapid ventricular rate-discussed the strategies with the patient for his atrial fibrillation  at this time rate control strategy was to be the most appropriate for this patient.  TEE cardioversion is not a good option for him given his noncompliance I doubt that he will take his anticoagulant as prescribed.  I reviewed his telemetry monitor his rate appears to be controlled.  We will keep the Cardizem.  If we need more for rate control we may consider digoxin. Note prolonged qt no amiodarone for now.   Acute on chronic hypoxic respiratory failure with noncompliance to dialysis - getting HD. Also plans for possible thoracocentesis.  Acute on chronic heart failure-continue with his hemodialysis  ESRD-on HD  Agree with midodrine with HD.     For questions or updates, please contact [place] Please consult [email or web address] for contact info under Cardiology/STEMI.      Signed, Jalesia Loudenslager, DO  09/11/2021, 1:02 PM

## 2021-09-11 NOTE — Progress Notes (Signed)
[facility] Progress Note   61 y.o. male HTN, dCHF, CVA, ascending aortic aneurysm, PEA arrest 07/2021, aflutter, COPD on 2.5L O2 at home, ESRD with history of severe . He's had many admissions for hypoxia in the setting of shortened or missed dialysis treatments. Last  treatments in the hospital were on 12/27 and 12/28; last outpatient treatments at [facility] were on 1/2 and 1/5 signing off early each time. 2hr7min and 2hr 30min, . Here w/ generalized aching, chest pain and whole body itching.  OP HD:  [facility] MWF 3.5h  400/500  51.5 kg  2/2 bath  RIJ TDC/  L AVF unusable  - edw appears to be 51.5-52 kg, did leave above his EDW last HD  - mircera 150 ug q2 last 12/21  - venofer 100mg  IV weekly    CXR 1/07 - IMPRESSION: 1. Increasing right pleural effusion and basilar atelectasis. 2. Increasing pulmonary edema.    CXR 1/10 - IMPRESSION: Some worsening of edema pattern. Persistent effusions, right larger than left.   Assessment/ Plan:   AHRF/ vol overload - last ECHO 08/12/21 EF 60-65%. Vol overload +/-  pleural effusions worsening. Poor UF w/ HD x 2 here due to signing off and hypotension. Will need additional HD today. Added midodrine for low BP's on HD. Per cardiology pt is not an amiodarone candidate due to noncompliance and he needs his cardizem for rate control. Have added midodrine at 10mg  tid. Will try to lower volume w/ HD today. Recommend eval for possible thoracentesis (L, R or both) given difficulty pulling fluid on HD.  ESRD - on HD MWF; patient continuous non-compliance with outpt HD. Refused extra HD yest, plan HD today.  BP/ volume - have started midodrine 10 tid. Minimal edema in legs/ arms Anemia of CKD - Hgb 11.2; ESA not due yet; monitor trends Secondary Hyperparathyroidism - PO4 high-continue binders Nutrition - Renal diet with fluid restriction GOC- patient with multiple co-morbidities and poor prognosis. Have d/w the patient in the past, have not brought this up  again with him this admission yet.     [name], MD 09/11/2021, 11:33 AM   Subjective:   Feeling better overall, no sig SOB today   Objective:   BP (!) 142/76 (BP Location: Left Arm)    Pulse 85    Temp 98.3 F (36.8 C) (Oral)    Resp 20    Ht 5\' 8"  (1.727 m)    Wt 53.4 kg    SpO2 90%    BMI 17.90 kg/m   Intake/Output Summary (Last 24 hours) at 09/11/2021 1133 Last data filed at 09/11/2021 0700 Gross per 24 hour  Intake 120 ml  Output --  Net 120 ml    Weight change:   Physical Exam: Gen: Alert, nad  Neck: No jvd Chest: cta bilat, no rales, dec'd R base Cor: irreg irreg  Abd: soft ntnd no ascites, not tachy  Ext: 1+ bilat pedal edema Neuro: Alert, NF, ox3 Access:  RIJ TDC / RUA BBF + pulse    Labs: BMET Recent Labs  Lab 09/07/21 1741 09/07/21 1750 09/08/21 0214 09/10/21 1122  NA 139 133* 138 135  K 7.0* 7.0* 4.1 4.2  CL 97* 101 98 95*  CO2 25  --  28 26  GLUCOSE 78 79 94 96  BUN 46* 68* 21 30*  CREATININE 6.29* 6.30* 3.78* 4.52*  CALCIUM 10.4*  --  10.0 10.5*  PHOS  --   --  4.4  --  CBC Recent Labs  Lab 09/07/21 1741 09/07/21 1750 09/08/21 0214 09/10/21 1122  WBC 5.3  --  5.1 5.4  NEUTROABS  --   --  3.6 4.2  HGB 9.9* 11.2* 10.1* 9.4*  HCT 32.7* 33.0* 32.2* 30.6*  MCV 108.6*  --  107.3* 106.3*  PLT 151  --  158 173     Medications:     (feeding supplement) PROSource Plus  30 mL Oral BID BM   apixaban  2.5 mg Oral BID   Chlorhexidine Gluconate Cloth  6 each Topical Q0600   dextromethorphan-guaiFENesin  1 tablet Oral BID   diltiazem  60 mg Oral Q6H   feeding supplement  237 mL Oral BID BM   levalbuterol  0.63 mg Nebulization BID   midodrine  10 mg Oral BID WC   multivitamin  1 tablet Oral QHS   sevelamer carbonate  1,600 mg Oral TID WC   sodium chloride flush  3 mL Intravenous Q12H

## 2021-09-11 NOTE — Progress Notes (Signed)
Pt impulsive and refuses a lot of care.  Refused to wear a gown or safety socks

## 2021-09-11 NOTE — Progress Notes (Signed)
PROGRESS NOTE    Thomas Robinson  TJQ:[id number] DOB: [birth year] DOA: 09/07/2021 PCP: [name], PA   Brief Narrative: 61 year old with past medical history significant for ESRD on hemodialysis MWF, ascending aortic aneurysm, PEA arrest 76/2263, diastolic heart failure, A. fib, prior CVA, COPD on chronic oxygen 2.5 L presented with shortness of breath and cough.  Patient with recurrent admissions with failure and fluid overload in the setting of noncompliance with hemodialysis.  Last similar admission just last week was discharged on December 20 05/2021.  Patient also developed A. fib with RVR 1/8 and was a started on IV Cardizem.  He was  transition to oral Cardizem 1/09.  He develops worsening Hypoxic Respiratory Failure. Repeated chest x ray worsening pulmonary edema. Limitation with fluid removal with HD due to Hypotension. Cardiology has been consulted, should we use amiodarone instead of Cardizem for A fib. Nephrology planning extra HD 1/10     Assessment & Plan:   Principal Problem:   Hyperkalemia Active Problems:   End stage renal disease (HCC)   Anemia of chronic disease   Elevated troponin   Atrial fibrillation with RVR (HCC)   Acute on chronic diastolic (congestive) heart failure (HCC)   Chronic respiratory failure with hypoxia (HCC)   Fever   Hypotension   Protein-calorie malnutrition, severe   Acute on chronic hypoxic respiratory failure secondary to acute on chronic diastolic heart failure exacerbation, pulmonary edema secondary to noncompliance with hemodialysis and limitation of fluid removal with HD.  -On chronic 2.5 L of oxygen, he remains on 2 L nasal cannula high flow this morning, I have try to wean to 8 L this morning, but did not manage to wean any further given he started to dip in the upper 80s. -Spurted failure multifactorial in the setting of volume overload/pulmonary edema and some pleural effusion, volume management is difficult given soft blood  pressure during dialysis. -Evidence of small PE as well, he is already on Eliquis. -Continue with guaifenesin/dextromethorphan.  -Consider midodrine during HD days. -Repeat imaging yesterday showing worsening pleural effusion, will request ultrasound thoracentesis.  A. fib RVR Mild elevation of troponin in the setting of A. fib RVR -Required Cardizem drip initially, currently transitioned to p.o. Cardizem.   -Continue with Eliquis -cardiology on board  ESRD on hemodialysis noncompliance with hemodialysis -Renal consulted, poor ultrafiltration with hemodialysis due to signing off and hypotension.  Anemia of chronic disease: Monitor hb.   Elevation of TSH: -Most likely related to sick euthyroid syndrome, less likely due to subclinical hypothyroidism, free T4 normal at 0.89, was normal last month as well, I will hold on initiating any Synthroid currently especially in the setting of A. fib with RVR, will need to recheck labs in 4 to 6 weeks.     See wound care documentation      Interventions: Ensure Enlive (each supplement provides 350kcal and 20 grams of protein), MVI, Prostat, Liberalize Diet  Estimated body mass index is 17.9 kg/m as calculated from the following:   Height as of this encounter: 5\' 8"  (1.727 m).   Weight as of this encounter: 53.4 kg.   DVT prophylaxis: Eliquis Code Status: Full code Family Communication: care discussed with patient.  Disposition Plan:  Status is: Inpatient  Remains inpatient appropriate because; admitted with acute on chronic hypoxic respiratory failure secondary to pulmonary edema, A. fib RVR        Consultants:  Nephrology   Procedures:  HD  Antimicrobials:    Subjective:  Patient report he  was able to tolerate sitting on the chair yesterday, he remains on 2 L oxygen this morning.   Objective: Vitals:   09/10/21 2008 09/10/21 2300 09/11/21 0542 09/11/21 0753  BP: (!) 130/103 120/75 117/70 (!) 142/76  Pulse: 92  99 81 85  Resp: 20 20 18 20   Temp: 98.1 F (36.7 C) 98.1 F (36.7 C) 98.7 F (37.1 C) 98.3 F (36.8 C)  TempSrc: Oral Oral Oral Oral  SpO2: 94% 90% 91% 90%  Weight:      Height:        Intake/Output Summary (Last 24 hours) at 09/11/2021 1124 Last data filed at 09/11/2021 0700 Gross per 24 hour  Intake 120 ml  Output --  Net 120 ml   Filed Weights   09/09/21 0032 09/09/21 0911 09/09/21 1210  Weight: 54.2 kg 54.1 kg 53.4 kg    Examination:  Awake Alert, Oriented X 3, frail, deconditioned, chronically ill-appearing Symmetrical Chest wall movement, diminished air entry at the bases right> left, crackles present as well RRR,No Gallops,Rubs or new Murmurs, No Parasternal Heave +ve B.Sounds, Abd Soft, No tenderness, No rebound - guarding or rigidity. No Cyanosis, Clubbing or edema, No new Rash or bruise       Data Reviewed: I have personally reviewed following labs and imaging studies  CBC: Recent Labs  Lab 09/07/21 1741 09/07/21 1750 09/08/21 0214 09/10/21 1122  WBC 5.3  --  5.1 5.4  NEUTROABS  --   --  3.6 4.2  HGB 9.9* 11.2* 10.1* 9.4*  HCT 32.7* 33.0* 32.2* 30.6*  MCV 108.6*  --  107.3* 106.3*  PLT 151  --  158 096   Basic Metabolic Panel: Recent Labs  Lab 09/07/21 1723 09/07/21 1741 09/07/21 1750 09/08/21 0214 09/10/21 1122  NA  --  139 133* 138 135  K  --  7.0* 7.0* 4.1 4.2  CL  --  97* 101 98 95*  CO2  --  25  --  28 26  GLUCOSE  --  78 79 94 96  BUN  --  46* 68* 21 30*  CREATININE  --  6.29* 6.30* 3.78* 4.52*  CALCIUM  --  10.4*  --  10.0 10.5*  MG 2.7*  --   --  2.2  --   PHOS  --   --   --  4.4  --    GFR: Estimated Creatinine Clearance: 12.8 mL/min (A) (by C-G formula based on SCr of 4.52 mg/dL (H)). Liver Function Tests: Recent Labs  Lab 09/07/21 1723 09/08/21 0214  AST 22 18  ALT 18 18  ALKPHOS 97 104  BILITOT 1.3* 1.3*  PROT 7.3 7.1  ALBUMIN 3.2* 3.2*   No results for input(s): LIPASE, AMYLASE in the last 168 hours. No results  for input(s): AMMONIA in the last 168 hours. Coagulation Profile: No results for input(s): INR, PROTIME in the last 168 hours. Cardiac Enzymes: Recent Labs  Lab 09/08/21 0213  CKTOTAL 17*   BNP (last 3 results) No results for input(s): PROBNP in the last 8760 hours. HbA1C: No results for input(s): HGBA1C in the last 72 hours. CBG: Recent Labs  Lab 09/10/21 2010  GLUCAP 109*   Lipid Profile: No results for input(s): CHOL, HDL, LDLCALC, TRIG, CHOLHDL, LDLDIRECT in the last 72 hours. Thyroid Function Tests: Recent Labs    09/10/21 1038 09/10/21 1122  FREET4  --  0.89  T3FREE 1.2*  --    Anemia Panel: No results for input(s): VITAMINB12, FOLATE, FERRITIN, TIBC,  IRON, RETICCTPCT in the last 72 hours. Sepsis Labs: Recent Labs  Lab 09/07/21 2044 09/08/21 0213  LATICACIDVEN 1.9 2.4*    Recent Results (from the past 240 hour(s))  Blood culture (routine x 2)     Status: None (Preliminary result)   Collection Time: 09/07/21  5:41 PM   Specimen: BLOOD LEFT ARM  Result Value Ref Range Status   Specimen Description BLOOD LEFT ARM  Final   Special Requests   Final    BOTTLES DRAWN AEROBIC AND ANAEROBIC Blood Culture results may not be optimal due to an inadequate volume of blood received in culture bottles   Culture   Final    NO GROWTH 3 DAYS Performed at [facility], [place] [address] [place], [place] [address]    Report Status PENDING  Incomplete  Blood culture (routine x 2)     Status: None (Preliminary result)   Collection Time: 09/07/21  5:55 PM   Specimen: BLOOD  Result Value Ref Range Status   Specimen Description BLOOD LEFT ANTECUBITAL  Final   Special Requests   Final    BOTTLES DRAWN AEROBIC ONLY Blood Culture results may not be optimal due to an inadequate volume of blood received in culture bottles   Culture   Final    NO GROWTH 3 DAYS Performed at [facility], [place] [address], [place], [place] [address]    Report Status PENDING  Incomplete          Radiology Studies: DG CHEST PORT 1 VIEW  Result Date: 09/10/2021 CLINICAL DATA:  Shortness of breath.  Hypoxemia. EXAM: PORTABLE CHEST 1 VIEW COMPARISON:  09/07/2021 FINDINGS: Central line remains in place with its tip in the SVC just above the right atrium. Persistent cardiomegaly. Worsening of pulmonary edema pattern. Bilateral effusions persist, right larger than left. Coexistent pneumonia not excluded. IMPRESSION: Some worsening of edema pattern. Persistent effusions, right larger than left. Electronically Signed   By: Nelson Chimes M.D.   On: 09/10/2021 10:29        Scheduled Meds:  (feeding supplement) PROSource Plus  30 mL Oral BID BM   apixaban  2.5 mg Oral BID   Chlorhexidine Gluconate Cloth  6 each Topical Q0600   dextromethorphan-guaiFENesin  1 tablet Oral BID   diltiazem  60 mg Oral Q6H   feeding supplement  237 mL Oral BID BM   levalbuterol  0.63 mg Nebulization BID   midodrine  10 mg Oral BID WC   multivitamin  1 tablet Oral QHS   sevelamer carbonate  1,600 mg Oral TID WC   sodium chloride flush  3 mL Intravenous Q12H   Continuous Infusions:  sodium chloride       LOS: 4 days      Phillips Climes, MD [facility]   If 7PM-7AM, please contact night-coverage [email or web address]  09/11/2021, 11:24 AM  Patient ID: Thomas Robinson, male   DOB: 16-Aug-1961, 62 y.o.   MRN: [id number]

## 2021-09-12 LAB — RENAL FUNCTION PANEL
Albumin: 2.6 g/dL — ABNORMAL LOW (ref 3.5–5.0)
Anion gap: 14 (ref 5–15)
BUN: 27 mg/dL — ABNORMAL HIGH (ref 8–23)
CO2: 26 mmol/L (ref 22–32)
Calcium: 10.1 mg/dL (ref 8.9–10.3)
Chloride: 96 mmol/L — ABNORMAL LOW (ref 98–111)
Creatinine, Ser: 3.96 mg/dL — ABNORMAL HIGH (ref 0.61–1.24)
GFR, Estimated: 16 mL/min — ABNORMAL LOW (ref >60)
Glucose, Bld: 136 mg/dL — ABNORMAL HIGH (ref 70–99)
Phosphorus: 5.2 mg/dL — ABNORMAL HIGH (ref 2.5–4.6)
Potassium: 4.4 mmol/L (ref 3.5–5.1)
Sodium: 136 mmol/L (ref 135–145)

## 2021-09-12 LAB — CULTURE, BLOOD (ROUTINE X 2)
Culture: NO GROWTH
Culture: NO GROWTH

## 2021-09-12 LAB — CBC
HCT: 33.3 % — ABNORMAL LOW (ref 39.0–52.0)
Hemoglobin: 10.4 g/dL — ABNORMAL LOW (ref 13.0–17.0)
MCH: 32.5 pg (ref 26.0–34.0)
MCHC: 31.2 g/dL (ref 30.0–36.0)
MCV: 104.1 fL — ABNORMAL HIGH (ref 80.0–100.0)
Platelets: 157 10*3/uL (ref 150–400)
RBC: 3.2 MIL/uL — ABNORMAL LOW (ref 4.22–5.81)
RDW: 16.3 % — ABNORMAL HIGH (ref 11.5–15.5)
WBC: 5.1 10*3/uL (ref 4.0–10.5)
nRBC: 0 % (ref 0.0–0.2)

## 2021-09-12 LAB — CYTOLOGY - NON PAP

## 2021-09-12 LAB — LACTATE DEHYDROGENASE: LDH: 163 U/L (ref 98–192)

## 2021-09-12 NOTE — Plan of Care (Signed)

## 2021-09-12 NOTE — Progress Notes (Signed)
PROGRESS NOTE    Thomas Robinson  KGU:[id number] DOB: May 16, 1961 DOA: 09/07/2021 PCP: [name], PA   Brief Narrative:  61 year old with past medical history significant for ESRD on hemodialysis MWF, ascending aortic aneurysm, PEA arrest 62/8315, diastolic heart failure, A. fib, prior CVA, COPD on chronic oxygen 2.5 L presented with shortness of breath and cough.  Patient with recurrent admissions with failure and fluid overload in the setting of noncompliance with hemodialysis.  Last similar admission just last week was discharged on December 20 05/2021.  Patient also developed A. fib with RVR 1/8 and was a started on IV Cardizem.  He was  transition to oral Cardizem 1/09.  He develops worsening Hypoxic Respiratory Failure. Repeated chest x ray worsening pulmonary edema. Limitation with fluid removal with HD due to Hypotension. Cardiology has been consulted, should we use amiodarone instead of Cardizem for A fib. Nephrology planning extra HD 1/10     Assessment & Plan:   Principal Problem:   Hyperkalemia Active Problems:   End stage renal disease (HCC)   Anemia of chronic disease   Elevated troponin   Atrial fibrillation with RVR (HCC)   Acute on chronic diastolic (congestive) heart failure (HCC)   Chronic respiratory failure with hypoxia (HCC)   Fever   Hypotension   Protein-calorie malnutrition, severe   Acute on chronic hypoxic respiratory failure secondary to acute on chronic diastolic heart failure exacerbation, pulmonary edema secondary to noncompliance with hemodialysis and limitation of fluid removal with HD.  -On chronic 2.5 L of oxygen, he is requiring 10 L high flow nasal cannula since admission, this has improved after his thoracentesis yesterday, I was able to taper to 4 L nasal cannula today.  . -Has worsening respiratory failure appears to be multifactorial in the setting of volume overload/pulmonary edema and some pleural effusion, volume management is  difficult given soft blood pressure during dialysis. -Evidence of small PE as well, he is already on Eliquis. -Continue with guaifenesin/dextromethorphan.  - midodrine during HD days, this will allow for better UF  Right pleural effusion -  status postthoracentesis 1/11, 1.3 L drained, appears to be transudative.  With LD of 48 and total protein<3. -Due to volume overload   A. fib RVR Mild elevation of troponin in the setting of A. fib RVR -Required Cardizem drip initially, currently transitioned to p.o. Cardizem.   -Continue with Eliquis -cardiology on board  ESRD on hemodialysis noncompliance with hemodialysis -Renal consulted, poor ultrafiltration with hemodialysis due to signing off and hypotension.  Anemia of chronic disease: Monitor hb.   Elevation of TSH: -Most likely related to sick euthyroid syndrome, less likely due to subclinical hypothyroidism, free T4 normal at 0.89, was normal last month as well, I will hold on initiating any Synthroid currently especially in the setting of A. fib with RVR, will need to recheck labs in 4 to 6 weeks.        Interventions: Ensure Enlive (each supplement provides 350kcal and 20 grams of protein), MVI, Prostat, Liberalize Diet  Estimated body mass index is 16.66 kg/m as calculated from the following:   Height as of this encounter: 5\' 8"  (1.727 m).   Weight as of this encounter: 49.7 kg.   DVT prophylaxis: Eliquis Code Status: Full code Family Communication: None at bedside, care discussed with patient.  Disposition Plan:  Status is: Inpatient  Remains inpatient appropriate because; admitted with acute on chronic hypoxic respiratory failure secondary to pulmonary edema, A. fib RVR  Consultants:  Nephrology  Cardiology  Procedures:  HD Thoracentesis by IR 09/11/2021, were 1.3 L were drained.  Antimicrobials:    Subjective:  No significant events overnight, patient reports he is feeling better, dyspnea has  improved after thoracentesis yesterday .   Objective: Vitals:   09/11/21 2350 09/12/21 0230 09/12/21 0554 09/12/21 0900  BP: (!) 106/56 127/88 117/77 119/74  Pulse: 98 98 86 75  Resp: 20 20 16 18   Temp: 98.4 F (36.9 C)   97.9 F (36.6 C)  TempSrc: Oral   Oral  SpO2: 100% 95% 100% 98%  Weight:      Height:        Intake/Output Summary (Last 24 hours) at 09/12/2021 1144 Last data filed at 09/11/2021 2124 Gross per 24 hour  Intake --  Output 3000 ml  Net -3000 ml   Filed Weights   09/09/21 1210 09/11/21 1810 09/11/21 2124  Weight: 53.4 kg 52.7 kg 49.7 kg    Examination:  Awake Alert, frail, chronically ill-appearing. Symmetrical Chest wall movement, diminished air entry at the bases, no wheezing Irregular irregular,No Gallops,Rubs or new Murmurs, No Parasternal Heave +ve B.Sounds, Abd Soft, No tenderness, No rebound - guarding or rigidity. No Cyanosis, Clubbing or edema, No new Rash or bruise       Data Reviewed: I have personally reviewed following labs and imaging studies  CBC: Recent Labs  Lab 09/07/21 1741 09/07/21 1750 09/08/21 0214 09/10/21 1122 09/12/21 0928  WBC 5.3  --  5.1 5.4 5.1  NEUTROABS  --   --  3.6 4.2  --   HGB 9.9* 11.2* 10.1* 9.4* 10.4*  HCT 32.7* 33.0* 32.2* 30.6* 33.3*  MCV 108.6*  --  107.3* 106.3* 104.1*  PLT 151  --  158 173 250   Basic Metabolic Panel: Recent Labs  Lab 09/07/21 1723 09/07/21 1741 09/07/21 1750 09/08/21 0214 09/10/21 1122 09/12/21 0928  NA  --  139 133* 138 135 136  K  --  7.0* 7.0* 4.1 4.2 4.4  CL  --  97* 101 98 95* 96*  CO2  --  25  --  28 26 26   GLUCOSE  --  78 79 94 96 136*  BUN  --  46* 68* 21 30* 27*  CREATININE  --  6.29* 6.30* 3.78* 4.52* 3.96*  CALCIUM  --  10.4*  --  10.0 10.5* 10.1  MG 2.7*  --   --  2.2  --   --   PHOS  --   --   --  4.4  --  5.2*   GFR: Estimated Creatinine Clearance: 13.6 mL/min (A) (by C-G formula based on SCr of 3.96 mg/dL (H)). Liver Function Tests: Recent Labs   Lab 09/07/21 1723 09/08/21 0214 09/12/21 0928  AST 22 18  --   ALT 18 18  --   ALKPHOS 97 104  --   BILITOT 1.3* 1.3*  --   PROT 7.3 7.1  --   ALBUMIN 3.2* 3.2* 2.6*   No results for input(s): LIPASE, AMYLASE in the last 168 hours. No results for input(s): AMMONIA in the last 168 hours. Coagulation Profile: No results for input(s): INR, PROTIME in the last 168 hours. Cardiac Enzymes: Recent Labs  Lab 09/08/21 0213  CKTOTAL 17*   BNP (last 3 results) No results for input(s): PROBNP in the last 8760 hours. HbA1C: No results for input(s): HGBA1C in the last 72 hours. CBG: Recent Labs  Lab 09/10/21 2010 09/11/21 2209  GLUCAP 109*  75   Lipid Profile: No results for input(s): CHOL, HDL, LDLCALC, TRIG, CHOLHDL, LDLDIRECT in the last 72 hours. Thyroid Function Tests: Recent Labs    09/10/21 1038 09/10/21 1122  FREET4  --  0.89  T3FREE 1.2*  --    Anemia Panel: No results for input(s): VITAMINB12, FOLATE, FERRITIN, TIBC, IRON, RETICCTPCT in the last 72 hours. Sepsis Labs: Recent Labs  Lab 09/07/21 2044 09/08/21 0213  LATICACIDVEN 1.9 2.4*    Recent Results (from the past 240 hour(s))  Blood culture (routine x 2)     Status: None (Preliminary result)   Collection Time: 09/07/21  5:41 PM   Specimen: BLOOD LEFT ARM  Result Value Ref Range Status   Specimen Description BLOOD LEFT ARM  Final   Special Requests   Final    BOTTLES DRAWN AEROBIC AND ANAEROBIC Blood Culture results may not be optimal due to an inadequate volume of blood received in culture bottles   Culture   Final    NO GROWTH 4 DAYS Performed at [facility], [place] [address], [place], [place] [address]    Report Status PENDING  Incomplete  Blood culture (routine x 2)     Status: None (Preliminary result)   Collection Time: 09/07/21  5:55 PM   Specimen: BLOOD  Result Value Ref Range Status   Specimen Description BLOOD LEFT ANTECUBITAL  Final   Special Requests   Final    BOTTLES DRAWN  AEROBIC ONLY Blood Culture results may not be optimal due to an inadequate volume of blood received in culture bottles   Culture   Final    NO GROWTH 4 DAYS Performed at [facility], [place] [address], [place], [place] [address]    Report Status PENDING  Incomplete  Gram stain     Status: None   Collection Time: 09/11/21  1:16 PM   Specimen: Fluid  Result Value Ref Range Status   Specimen Description FLUID RIGHT PLEURAL  Final   Special Requests NONE  Final   Gram Stain   Final    RARE WBC PRESENT, PREDOMINANTLY MONONUCLEAR NO ORGANISMS SEEN Performed at [facility], [place] [address] [place], [place] [address]    Report Status 09/11/2021 FINAL  Final         Radiology Studies: DG Chest 1 View  Result Date: 09/11/2021 CLINICAL DATA:  Post thoracentesis. EXAM: PORTABLE CHEST 1 VIEW COMPARISON:  IR ultrasound, earlier same day. Chest XR, 09/10/2021 and 09/07/2018. CT chest, 08/26/2021. FINDINGS: Support lines: RIGHT IJ CVC with catheter tip at the proximal RIGHT atrium. Overlying pacer leads. Enlargement of the cardiac silhouette. Aortic arch calcifications. Interstitial thickening and coarse bilateral perihilar opacities. No focal consolidation. Trace residual RIGHT pleural effusion. No pneumothorax. Cervical fusion hardware. No interval osseous abnormality. IMPRESSION: 1. Interval removal of RIGHT pleural effusion with trace residual. No postprocedure pneumothorax. 2. Cardiomegaly with pulmonary findings consistent with pulmonary edema. 3. Aortic Atherosclerosis (ICD10-I70.0). Electronically Signed   By: Michaelle Birks M.D.   On: 09/11/2021 13:32   IR THORACENTESIS ASP PLEURAL SPACE W/IMG GUIDE  Result Date: 09/11/2021 INDICATION: Shortness of breath, bilateral right greater than left pleural effusions seen on previous chest x-ray. Request for therapeutic and diagnostic thoracentesis. EXAM: ULTRASOUND GUIDED RIGHT THORACENTESIS MEDICATIONS: 8 mL 1% lidocaine COMPLICATIONS: None  immediate. PROCEDURE: An ultrasound guided thoracentesis was thoroughly discussed with the patient and questions answered. The benefits, risks, alternatives and complications were also discussed. The patient understands and wishes to proceed  with the procedure. Written consent was obtained. Ultrasound was performed to localize and mark an adequate pocket of fluid in the right chest. The area was then prepped and draped in the normal sterile fashion. 1% Lidocaine was used for local anesthesia. Under ultrasound guidance a 6 Fr Safe-T-Centesis catheter was introduced. Thoracentesis was performed. The catheter was removed and a dressing applied. FINDINGS: A total of approximately 1.3 L of hazy light brown fluid was removed. Samples were sent to the laboratory as requested by the clinical team. Post procedure chest X-ray reviewed, negative for pneumothorax. IMPRESSION: Successful ultrasound guided right thoracentesis yielding 1.3 L of pleural fluid. Read by: Durenda Guthrie, PA-C Electronically Signed   By: Sandi Mariscal M.D.   On: 09/11/2021 15:26        Scheduled Meds:  (feeding supplement) PROSource Plus  30 mL Oral BID BM   apixaban  2.5 mg Oral BID   Chlorhexidine Gluconate Cloth  6 each Topical Q0600   dextromethorphan-guaiFENesin  1 tablet Oral BID   diltiazem  60 mg Oral Q6H   feeding supplement  237 mL Oral BID BM   levalbuterol  0.63 mg Nebulization BID   midodrine  10 mg Oral BID WC   multivitamin  1 tablet Oral QHS   sevelamer carbonate  1,600 mg Oral TID WC   sodium chloride flush  3 mL Intravenous Q12H   Continuous Infusions:  sodium chloride       LOS: 5 days      Phillips Climes, MD [facility]   If 7PM-7AM, please contact night-coverage [email or web address]  09/12/2021, 11:44 AM  Patient ID: Thomas Robinson, male   DOB: [birth year], 61 y.o.   MRN: [id number] Patient ID: Thomas Robinson, male   DOB: 1961-08-18, 61 y.o.   MRN: [id number]

## 2021-09-12 NOTE — Progress Notes (Signed)
Progress Note  Patient Name: Thomas Robinson Date of Encounter: 09/12/2021  Primary Cardiologist: [name], DO   Subjective   Patient was seen and examined at his bedside.  He tells me his breathing is a lot better.  He is status post right thoracocentesis yesterday which yielded 1.3 L of hazy light brown fluid.  Inpatient Medications    Scheduled Meds:  (feeding supplement) PROSource Plus  30 mL Oral BID BM   apixaban  2.5 mg Oral BID   Chlorhexidine Gluconate Cloth  6 each Topical Q0600   dextromethorphan-guaiFENesin  1 tablet Oral BID   diltiazem  60 mg Oral Q6H   feeding supplement  237 mL Oral BID BM   levalbuterol  0.63 mg Nebulization BID   midodrine  10 mg Oral BID WC   multivitamin  1 tablet Oral QHS   sevelamer carbonate  1,600 mg Oral TID WC   sodium chloride flush  3 mL Intravenous Q12H   Continuous Infusions:  sodium chloride     PRN Meds: sodium chloride, acetaminophen **OR** acetaminophen, albuterol, HYDROcodone-acetaminophen, lidocaine (PF), sevelamer carbonate, sodium chloride flush   Vital Signs    Vitals:   09/11/21 2139 09/11/21 2350 09/12/21 0230 09/12/21 0554  BP: 130/77 (!) 106/56 127/88 117/77  Pulse:  98 98 86  Resp: 20 20 20 16   Temp: 97.7 F (36.5 C) 98.4 F (36.9 C)    TempSrc: Axillary Oral    SpO2:  100% 95% 100%  Weight:      Height:        Intake/Output Summary (Last 24 hours) at 09/12/2021 1023 Last data filed at 09/11/2021 2124 Gross per 24 hour  Intake --  Output 3000 ml  Net -3000 ml   Filed Weights   09/09/21 1210 09/11/21 1810 09/11/21 2124  Weight: 53.4 kg 52.7 kg 49.7 kg    Telemetry    Atrial fibirllation - Personally Reviewed  ECG    None today  - Personally Reviewed  Physical Exam   General: Comfortable Head: Atraumatic, normal size  Eyes: PEERLA, EOMI  Neck: Supple, normal JVD Cardiac: Normal S1, S2; RRR; no murmurs, rubs, or gallops Lungs: Clear to auscultation bilaterally Abd: Soft,  nontender, no hepatomegaly  Ext: warm, no edema Musculoskeletal: No deformities, BUE and BLE strength normal and equal Skin: Warm and dry, no rashes   Neuro: Alert and oriented to person, place, time, and situation, CNII-XII grossly intact, no focal deficits  Psych: Normal mood and affect   Labs    Chemistry Recent Labs  Lab 09/07/21 1723 09/07/21 1741 09/08/21 0214 09/10/21 1122 09/12/21 0928  NA  --    < > 138 135 136  K  --    < > 4.1 4.2 4.4  CL  --    < > 98 95* 96*  CO2  --    < > 28 26 26   GLUCOSE  --    < > 94 96 136*  BUN  --    < > 21 30* 27*  CREATININE  --    < > 3.78* 4.52* 3.96*  CALCIUM  --    < > 10.0 10.5* 10.1  PROT 7.3  --  7.1  --   --   ALBUMIN 3.2*  --  3.2*  --  2.6*  AST 22  --  18  --   --   ALT 18  --  18  --   --   ALKPHOS 97  --  104  --   --   BILITOT 1.3*  --  1.3*  --   --   GFRNONAA  --    < > 17* 14* 16*  ANIONGAP  --    < > 12 14 14    < > = values in this interval not displayed.     Hematology Recent Labs  Lab 09/08/21 0214 09/10/21 1122 09/12/21 0928  WBC 5.1 5.4 5.1  RBC 3.00* 2.88* 3.20*  HGB 10.1* 9.4* 10.4*  HCT 32.2* 30.6* 33.3*  MCV 107.3* 106.3* 104.1*  MCH 33.7 32.6 32.5  MCHC 31.4 30.7 31.2  RDW 17.3* 16.5* 16.3*  PLT 158 173 157    Cardiac EnzymesNo results for input(s): TROPONINI in the last 168 hours. No results for input(s): TROPIPOC in the last 168 hours.   BNP Recent Labs  Lab 09/10/21 1038 09/10/21 1631 09/11/21 0608  BNP >4,500.0* >4,500.0* 4,395.2*     DDimer No results for input(s): DDIMER in the last 168 hours.   Radiology    DG Chest 1 View  Result Date: 09/11/2021 CLINICAL DATA:  Post thoracentesis. EXAM: PORTABLE CHEST 1 VIEW COMPARISON:  IR ultrasound, earlier same day. Chest XR, 09/10/2021 and 09/07/2018. CT chest, 08/26/2021. FINDINGS: Support lines: RIGHT IJ CVC with catheter tip at the proximal RIGHT atrium. Overlying pacer leads. Enlargement of the cardiac silhouette. Aortic arch  calcifications. Interstitial thickening and coarse bilateral perihilar opacities. No focal consolidation. Trace residual RIGHT pleural effusion. No pneumothorax. Cervical fusion hardware. No interval osseous abnormality. IMPRESSION: 1. Interval removal of RIGHT pleural effusion with trace residual. No postprocedure pneumothorax. 2. Cardiomegaly with pulmonary findings consistent with pulmonary edema. 3. Aortic Atherosclerosis (ICD10-I70.0). Electronically Signed   By: Michaelle Birks M.D.   On: 09/11/2021 13:32   IR THORACENTESIS ASP PLEURAL SPACE W/IMG GUIDE  Result Date: 09/11/2021 INDICATION: Shortness of breath, bilateral right greater than left pleural effusions seen on previous chest x-ray. Request for therapeutic and diagnostic thoracentesis. EXAM: ULTRASOUND GUIDED RIGHT THORACENTESIS MEDICATIONS: 8 mL 1% lidocaine COMPLICATIONS: None immediate. PROCEDURE: An ultrasound guided thoracentesis was thoroughly discussed with the patient and questions answered. The benefits, risks, alternatives and complications were also discussed. The patient understands and wishes to proceed with the procedure. Written consent was obtained. Ultrasound was performed to localize and mark an adequate pocket of fluid in the right chest. The area was then prepped and draped in the normal sterile fashion. 1% Lidocaine was used for local anesthesia. Under ultrasound guidance a 6 Fr Safe-T-Centesis catheter was introduced. Thoracentesis was performed. The catheter was removed and a dressing applied. FINDINGS: A total of approximately 1.3 L of hazy light brown fluid was removed. Samples were sent to the laboratory as requested by the clinical team. Post procedure chest X-ray reviewed, negative for pneumothorax. IMPRESSION: Successful ultrasound guided right thoracentesis yielding 1.3 L of pleural fluid. Read by: Durenda Guthrie, PA-C Electronically Signed   By: Sandi Mariscal M.D.   On: 09/11/2021 15:26    Cardiac Studies   Echo  08/12/2021 1. LVOT outflow obstruction. Left ventricular ejection fraction, by  estimation, is 60 to 65%. The left ventricle has normal function. There is  severe left ventricular hypertrophy. Left ventricular diastolic parameters  are consistent with Grade I  diastolic dysfunction (impaired relaxation).   2. The right ventricular size is mildly enlarged. Mildly increased right  ventricular wall thickness. There is severely elevated pulmonary artery  systolic pressure.   3. Left atrial size was severely dilated.   4. Right  atrial size was severely dilated.   5. A small pericardial effusion is present. There is no evidence of  cardiac tamponade.   6. No evidence of mitral valve regurgitation.   7. Tricuspid valve regurgitation is mild to moderate.   8. The aortic valve is calcified. Aortic valve regurgitation is mild to  moderate.   9. Aortic Small ascending aortic aneurysm 4.2 cm.  10. The inferior vena cava is dilated in size with <50% respiratory  variability, suggesting right atrial pressure of 15 mmHg.   Conclusion(s)/Recommendation(s): Compared to prior echo 07/19/2021, no  significant differences.   Patient Profile     61 y.o. male  normal coronary arteries by cath @ [facility] 08/2020, ESRD (frequent noncompliance with HD), COPD with chronic respiratory failure, sacral decubitus ulcers, GIB, anemia, chronic diastolic CHF with fluid management intended to be by dialysis, asthma, CVA, ascending aortic aneurysm (4.2cm by CT 08/2021), PEA arrest 07/2021, paroxysmal atrial fibrillation/flutter, peripheral neuropathy and non compliance who was admitted for acute exacerbation of heart failure.  Assessment & Plan    Atrial fibrillation with rapid ventricular rate-he is rate controlled today.  We will continue his rate control strategy.  As noted yesterday rhythm control strategy with TEE cardioversion is not a good option for him given his noncompliance I doubt that he will take his  anticoagulant as prescribed.  I reviewed his telemetry monitor his rate appears to be controlled.  We will keep the Cardizem.  If we need more for rate control we may consider digoxin. Note prolonged qt no amiodarone for now.   Acute on chronic hypoxic respiratory failure with noncompliance to dialysis - getting HD. Also plans for possible thoracocentesis.  Acute on chronic heart failure-continue with his hemodialysis  ESRD-on HD  Agree with midodrine with HD.   For questions or updates, please contact [place] Please consult [email or web address] for contact info under Cardiology/STEMI.      Signed, [name], DO  09/12/2021, 10:23 AM

## 2021-09-12 NOTE — Progress Notes (Signed)
Pt refused labdraw

## 2021-09-12 NOTE — Progress Notes (Addendum)
[name] Progress Note    OP HD:  SW MWF 3.5h  400/500  51.5 kg  2/2 bath  RIJ TDC/  L AVF unusable  Hep none  - edw appears to be 51.5-52 kg, did leave above his EDW last HD  - mircera 150 ug q2 last 12/21  - venofer 100mg  IV weekly    CXR 1/07 - IMPRESSION: 1. Increasing right pleural effusion and basilar atelectasis. 2. Increasing pulmonary edema.    CXR 1/10 - IMPRESSION: Some worsening of edema pattern. Persistent effusions, right larger than left.   Assessment/ Plan:   AHRF/ vol overload - last ECHO 08/12/21 EF 60-65%. Vol overload +/-  pleural effusions worsening. Added midodrine for low BP's on HD. Per cardiology pt is not an amiodarone candidate due to noncompliance and he needs his cardizem for rate control. Have added midodrine at 10mg  tid, think it's helping. Got 3 L off w/ HD yest and had R thora for 1.4 L.  Plan HD tomorrow w/ max UF, cont to lower vol and wean O2 support down.  ESRD - on HD MWF; patient continuous non-compliance with outpt HD. HD tomorrow.  BP/ volume - have started midodrine 10 tid. Minimal edema in legs/ arms Anemia of CKD - Hgb 11.2; ESA not due yet; monitor trends Secondary Hyperparathyroidism - PO4 high-continue binders Nutrition - Renal diet with fluid restriction GOC- patient with multiple co-morbidities and poor prognosis.      [name], MD 09/12/2021, 12:45 PM   Subjective:   Thomas Robinson for 1.4 L yesterday and net 3 L UF on HD. Down to 49kg   Objective:   BP 113/64 (BP Location: Left Arm)    Pulse 80    Temp 97.9 F (36.6 C) (Oral)    Resp 20    Ht 5\' 8"  (1.727 m)    Wt 49.7 kg    SpO2 92%    BMI 16.66 kg/m   Intake/Output Summary (Last 24 hours) at 09/12/2021 1245 Last data filed at 09/11/2021 2124 Gross per 24 hour  Intake --  Output 3000 ml  Net -3000 ml    Weight change:   Physical Exam: Gen: Alert, nad  Neck: No jvd Chest: cta bilat, no rales, dec'd R base Cor: irreg irreg  Abd: soft ntnd no ascites, not  tachy  Ext: 1+ bilat pedal edema Neuro: Alert, NF, ox3 Access:  RIJ TDC / RUA BBF + pulse    Labs: BMET Recent Labs  Lab 09/07/21 1741 09/07/21 1750 09/08/21 0214 09/10/21 1122 09/12/21 0928  NA 139 133* 138 135 136  K 7.0* 7.0* 4.1 4.2 4.4  CL 97* 101 98 95* 96*  CO2 25  --  28 26 26   GLUCOSE 78 79 94 96 136*  BUN 46* 68* 21 30* 27*  CREATININE 6.29* 6.30* 3.78* 4.52* 3.96*  CALCIUM 10.4*  --  10.0 10.5* 10.1  PHOS  --   --  4.4  --  5.2*    CBC Recent Labs  Lab 09/07/21 1741 09/07/21 1750 09/08/21 0214 09/10/21 1122 09/12/21 0928  WBC 5.3  --  5.1 5.4 5.1  NEUTROABS  --   --  3.6 4.2  --   HGB 9.9* 11.2* 10.1* 9.4* 10.4*  HCT 32.7* 33.0* 32.2* 30.6* 33.3*  MCV 108.6*  --  107.3* 106.3* 104.1*  PLT 151  --  158 173 157     Medications:     (feeding supplement) PROSource Plus  30 mL Oral BID  BM   apixaban  2.5 mg Oral BID   Chlorhexidine Gluconate Cloth  6 each Topical Q0600   dextromethorphan-guaiFENesin  1 tablet Oral BID   diltiazem  60 mg Oral Q6H   feeding supplement  237 mL Oral BID BM   levalbuterol  0.63 mg Nebulization BID   midodrine  10 mg Oral BID WC   multivitamin  1 tablet Oral QHS   sevelamer carbonate  1,600 mg Oral TID WC   sodium chloride flush  3 mL Intravenous Q12H

## 2021-09-13 ENCOUNTER — Other Ambulatory Visit (HOSPITAL_COMMUNITY): Payer: Self-pay

## 2021-09-13 LAB — RENAL FUNCTION PANEL
Albumin: 2.6 g/dL — ABNORMAL LOW (ref 3.5–5.0)
Anion gap: 13 (ref 5–15)
BUN: 38 mg/dL — ABNORMAL HIGH (ref 8–23)
CO2: 26 mmol/L (ref 22–32)
Calcium: 10.3 mg/dL (ref 8.9–10.3)
Chloride: 97 mmol/L — ABNORMAL LOW (ref 98–111)
Creatinine, Ser: 5.14 mg/dL — ABNORMAL HIGH (ref 0.61–1.24)
GFR, Estimated: 12 mL/min — ABNORMAL LOW (ref >60)
Glucose, Bld: 88 mg/dL (ref 70–99)
Phosphorus: 5.4 mg/dL — ABNORMAL HIGH (ref 2.5–4.6)
Potassium: 4.7 mmol/L (ref 3.5–5.1)
Sodium: 136 mmol/L (ref 135–145)

## 2021-09-13 MED ORDER — MIDODRINE HCL 10 MG PO TABS
10.0000 mg | ORAL_TABLET | Freq: Three times a day (TID) | ORAL | 0 refills | Status: DC
  Filled 2021-09-13: qty 90, 30d supply, fill #0

## 2021-09-13 MED ORDER — ENSURE ENLIVE PO LIQD: 237.0000 mL | Freq: Two times a day (BID) | ORAL | 12 refills | Status: AC

## 2021-09-13 MED ORDER — APIXABAN 2.5 MG PO TABS
2.5000 mg | ORAL_TABLET | Freq: Two times a day (BID) | ORAL | 0 refills | Status: DC
  Filled 2021-09-13: qty 60, 30d supply, fill #0

## 2021-09-13 MED ORDER — DILTIAZEM HCL ER COATED BEADS 240 MG PO CP24
240.0000 mg | ORAL_CAPSULE | Freq: Every day | ORAL | Status: DC
  Administered 2021-09-13: 240 mg via ORAL
  Filled 2021-09-13 (×2): qty 1

## 2021-09-13 MED ORDER — HEPARIN SODIUM (PORCINE) 1000 UNIT/ML IJ SOLN
INTRAMUSCULAR | Status: AC
  Filled 2021-09-13: qty 2

## 2021-09-13 MED ORDER — DILTIAZEM HCL ER COATED BEADS 240 MG PO CP24
240.0000 mg | ORAL_CAPSULE | Freq: Every day | ORAL | 0 refills | Status: DC
  Filled 2021-09-13: qty 30, 30d supply, fill #0

## 2021-09-13 NOTE — Progress Notes (Signed)
Subjective:  No sob this am in Hd , about to start  Hd and Uf  3l attempt bp 136/77 this am Midodrine pre hd / feels could be dc after hd   Objective Vital signs in last 24 hours: Vitals:   09/12/21 2348 09/13/21 0400 09/13/21 0601 09/13/21 0603  BP: 109/65  137/77 136/77  Pulse: 90 80  86  Resp: 20 16  18   Temp: 98 F (67.2 C)     TempSrc: Oral     SpO2: 94% 95%  93%  Weight:      Height:       Weight change:   Physical Exam: General: alert , NAD Heart: Irreg, irreg, rate stable , no rub Lungs: BS decr R base, no sob  Abdomen: NABS, soft , NT, ND  Extremities: no pedal edema,  Dialysis Access: R IJ  PC , RUA AVF  + bruit    OP HD:  SW MWF 3.5h  400/500  51.5 kg  2/2 bath  RIJ TDC/  L AVF unusable  - edw appears to be 51.5-52 kg, did leave above his EDW last HD  - mircera 150 ug q2 last 12/21  - venofer 100mg  IV weekly     CXR 1/07 - IMPRESSION: 1. Increasing right pleural effusion and basilar atelectasis. 2. Increasing pulmonary edema.    CXR 1/10 - IMPRESSION: Some worsening of edema pattern. Persistent effusions, right larger than left.  Problem/Plan: AHRF/ vol overload - last ECHO 08/12/21 EF 60-65%. Vol overload +/-  pleural effusions worsening. IN CENTER AND IN HOSP=Poor UF w/ HD x 2 SINCE ADMIT  due to signing off and hypotension. Added midodrine for low BP's on HD. Per cardiology pt is not an amiodarone candidate due to noncompliance and he needs his cardizem for rate control. Have added midodrine at 10mg  tid.  LOWER EDW  AT DC ? 49KG  fu wt /bp today  (L, R Pl Effusions )  Yest =R Thoracentesis . 1.3 L  hazy light brown fluid ESRD - on HD MWF; patient continuous non-compliance with outpt HD. HD 3.l uf on 1/11, 1/12,  plan HD today.  BP/ volume - have started midodrine 10 tid.No pedal edema  today  3l uf last 2 days  with hd  Anemia of CKD - Hgb  10.4 <11.2; ESA not due yet; monitor trends Secondary Hyperparathyroidism - PO4 high-continue binders 5.2 yest  with  binders Nutrition - Renal diet with fluid restriction GOC- patient with multiple co-morbidities and poor prognosis. Have d/w the patient in the past, have not brought this up again with him this admission yet.  Possible dc today   Ernest Haber, PA-C [facility] Beeper [phone] 09/13/2021,7:33 AM  LOS: 6 days   Labs: Basic Metabolic Panel: Recent Labs  Lab 09/08/21 0214 09/10/21 1122 09/12/21 0928  NA 138 135 136  K 4.1 4.2 4.4  CL 98 95* 96*  CO2 28 26 26   GLUCOSE 94 96 136*  BUN 21 30* 27*  CREATININE 3.78* 4.52* 3.96*  CALCIUM 10.0 10.5* 10.1  PHOS 4.4  --  5.2*   Liver Function Tests: Recent Labs  Lab 09/07/21 1723 09/08/21 0214 09/12/21 0928  AST 22 18  --   ALT 18 18  --   ALKPHOS 97 104  --   BILITOT 1.3* 1.3*  --   PROT 7.3 7.1  --   ALBUMIN 3.2* 3.2* 2.6*   No results for input(s): LIPASE, AMYLASE in the last 168  hours. No results for input(s): AMMONIA in the last 168 hours. CBC: Recent Labs  Lab 09/07/21 1741 09/07/21 1750 09/08/21 0214 09/10/21 1122 09/12/21 0928  WBC 5.3  --  5.1 5.4 5.1  NEUTROABS  --   --  3.6 4.2  --   HGB 9.9*   < > 10.1* 9.4* 10.4*  HCT 32.7*   < > 32.2* 30.6* 33.3*  MCV 108.6*  --  107.3* 106.3* 104.1*  PLT 151  --  158 173 157   < > = values in this interval not displayed.   Cardiac Enzymes: Recent Labs  Lab 09/08/21 0213  CKTOTAL 17*   CBG: Recent Labs  Lab 09/10/21 2010 09/11/21 2209  GLUCAP 109* 75    Studies/Results: DG Chest 1 View  Result Date: 09/11/2021 CLINICAL DATA:  Post thoracentesis. EXAM: PORTABLE CHEST 1 VIEW COMPARISON:  IR ultrasound, earlier same day. Chest XR, 09/10/2021 and 09/07/2018. CT chest, 08/26/2021. FINDINGS: Support lines: RIGHT IJ CVC with catheter tip at the proximal RIGHT atrium. Overlying pacer leads. Enlargement of the cardiac silhouette. Aortic arch calcifications. Interstitial thickening and coarse bilateral perihilar opacities. No focal consolidation. Trace  residual RIGHT pleural effusion. No pneumothorax. Cervical fusion hardware. No interval osseous abnormality. IMPRESSION: 1. Interval removal of RIGHT pleural effusion with trace residual. No postprocedure pneumothorax. 2. Cardiomegaly with pulmonary findings consistent with pulmonary edema. 3. Aortic Atherosclerosis (ICD10-I70.0). Electronically Signed   By: Michaelle Birks M.D.   On: 09/11/2021 13:32   IR THORACENTESIS ASP PLEURAL SPACE W/IMG GUIDE  Result Date: 09/11/2021 INDICATION: Shortness of breath, bilateral right greater than left pleural effusions seen on previous chest x-ray. Request for therapeutic and diagnostic thoracentesis. EXAM: ULTRASOUND GUIDED RIGHT THORACENTESIS MEDICATIONS: 8 mL 1% lidocaine COMPLICATIONS: None immediate. PROCEDURE: An ultrasound guided thoracentesis was thoroughly discussed with the patient and questions answered. The benefits, risks, alternatives and complications were also discussed. The patient understands and wishes to proceed with the procedure. Written consent was obtained. Ultrasound was performed to localize and mark an adequate pocket of fluid in the right chest. The area was then prepped and draped in the normal sterile fashion. 1% Lidocaine was used for local anesthesia. Under ultrasound guidance a 6 Fr Safe-T-Centesis catheter was introduced. Thoracentesis was performed. The catheter was removed and a dressing applied. FINDINGS: A total of approximately 1.3 L of hazy light brown fluid was removed. Samples were sent to the laboratory as requested by the clinical team. Post procedure chest X-ray reviewed, negative for pneumothorax. IMPRESSION: Successful ultrasound guided right thoracentesis yielding 1.3 L of pleural fluid. Read by: Durenda Guthrie, PA-C Electronically Signed   By: Sandi Mariscal M.D.   On: 09/11/2021 15:26   Medications:  sodium chloride      (feeding supplement) PROSource Plus  30 mL Oral BID BM   apixaban  2.5 mg Oral BID   Chlorhexidine Gluconate  Cloth  6 each Topical Q0600   dextromethorphan-guaiFENesin  1 tablet Oral BID   diltiazem  60 mg Oral Q6H   feeding supplement  237 mL Oral BID BM   levalbuterol  0.63 mg Nebulization BID   midodrine  10 mg Oral BID WC   multivitamin  1 tablet Oral QHS   sevelamer carbonate  1,600 mg Oral TID WC   sodium chloride flush  3 mL Intravenous Q12H

## 2021-09-13 NOTE — Discharge Instructions (Signed)
Follow with Primary MD [name], PA in 7 days   Get CBC, CMP,  checked  by Primary MD next visit.    Activity: As tolerated with Full fall precautions use walker/cane & assistance as needed   Disposition Home    Diet: Renal Diet   On your next visit with your primary care physician please Get Medicines reviewed and adjusted.   Please request your Prim.MD to go over all Hospital Tests and Procedure/Radiological results at the follow up, please get all Hospital records sent to your Prim MD by signing hospital release before you go home.   If you experience worsening of your admission symptoms, develop shortness of breath, life threatening emergency, suicidal or homicidal thoughts you must seek medical attention immediately by calling 911 or calling your MD immediately  if symptoms less severe.  You Must read complete instructions/literature along with all the possible adverse reactions/side effects for all the Medicines you take and that have been prescribed to you. Take any new Medicines after you have completely understood and accpet all the possible adverse reactions/side effects.   Do not drive, operating heavy machinery, perform activities at heights, swimming or participation in water activities or provide baby sitting services if your were admitted for syncope or siezures until you have seen by Primary MD or a Neurologist and advised to do so again.  Do not drive when taking Pain medications.    Do not take more than prescribed Pain, Sleep and Anxiety Medications  Special Instructions: If you have smoked or chewed Tobacco  in the last 2 yrs please stop smoking, stop any regular Alcohol  and or any Recreational drug use.  Wear Seat belts while driving.   Please note  You were cared for by a hospitalist during your hospital stay. If you have any questions about your discharge medications or the care you received while you were in the hospital after you are discharged,  you can call the unit and asked to speak with the hospitalist on call if the hospitalist that took care of you is not available. Once you are discharged, your primary care physician will handle any further medical issues. Please note that NO REFILLS for any discharge medications will be authorized once you are discharged, as it is imperative that you return to your primary care physician (or establish a relationship with a primary care physician if you do not have one) for your aftercare needs so that they can reassess your need for medications and monitor your lab values.

## 2021-09-13 NOTE — Progress Notes (Signed)
Pt to d/c today. Contacted [facility] to make them aware pt will d/c today and will resume on Monday.   Melven Sartorius Renal Navigator [phone]

## 2021-09-13 NOTE — Plan of Care (Signed)
  Problem: Pain Managment: Goal: General experience of comfort will improve Outcome: Progressing   Problem: Safety: Goal: Ability to remain free from injury will improve Outcome: Progressing   Problem: Skin Integrity: Goal: Risk for impaired skin integrity will decrease Outcome: Progressing   

## 2021-09-13 NOTE — Discharge Summary (Signed)
Physician Discharge Summary  Jaysean Manville JQB:[id number] DOB: 01-31-1964 DOA: 09/07/2021  PCP: [name], PA  Admit date: 09/07/2021 Discharge date: 09/13/2021  Admitted From:Home Disposition:  Home   Recommendations for Outpatient Follow-up:  Follow up with PCP in 1-2 weeks Please obtain BMP/CBC in one week Check TSH, free T4 in 4 to 6 weeks.   Home Health:YES Equipment/Devices: has home O2  Discharge Condition:Stable, extremely frail, high risk for readmissions. CODE STATUS:FULL Diet recommendation: Renal  Brief/Interim Summary:  Acute on chronic hypoxic respiratory failure secondary to acute on chronic diastolic heart failure exacerbation, pulmonary edema secondary to noncompliance with hemodialysis and limitation of fluid removal with HD.  -On chronic 2.5 L of oxygen, he is requiring 10 L high flow nasal cannula since admission, this has gradually improved with dialysis, but dialysis has been limited due to soft blood pressure, but this significantly improved after thoracentesis with his oxygen requirement drastically improved from 10 to 34 L nasal cannula after paracentesis. -Has worsening respiratory failure appears to be multifactorial in the setting of volume overload/pulmonary edema and some pleural effusion, volume management is difficult given soft blood pressure during dialysis. -Evidence of small PE as well, he is already on Eliquis. - midodrine during HD days, this will allow for better UF   Right pleural effusion>> transudative -  status postthoracentesis 1/11, 1.3 L drained, appears to be transudative.  With LD of 48 and total protein<3. -Due to volume overload     A. fib RVR Mild elevation of troponin in the setting of A. fib RVR -Required Cardizem drip initially, currently transitioned to p.o. Cardizem 60 mg oral every 6 hours, this has been solidified to Cardizem CD 240 mg oral daily.-Continue with Eliquis -cardiology consulted, follow with his primary  cardiologist as an outpatient   ESRD on hemodialysis noncompliance with hemodialysis -Renal consulted, poor ultrafiltration with hemodialysis due to signing off and hypotension.  To continue hemodialysis on Monday Wednesday Friday schedule -Patient started on midodrine for soft blood pressure, mainly during dialysis  Hypertension/hypotension -All antihypertensive medications has been discontinued given soft blood pressure, but he is currently on Cardizem CD 240 mg oral daily mainly for heart rate control, as well he is started on midodrine.   Anemia of chronic disease: Monitor hb.    Elevation of TSH: -Most likely related to sick euthyroid syndrome, less likely due to subclinical hypothyroidism, free T4 normal at 0.89, was normal last month as well, I will hold on initiating any Synthroid currently especially in the setting of A. fib with RVR, will need to recheck labs in 4 to 6 weeks.   Malnutrition/failure to thrive -encouraged to continue supplements, will arrange for home health PT      Discharge Diagnoses:  Principal Problem:   Hyperkalemia Active Problems:   End stage renal disease (HCC)   Anemia of chronic disease   Elevated troponin   Atrial fibrillation with RVR (HCC)   Acute on chronic diastolic (congestive) heart failure (HCC)   Chronic respiratory failure with hypoxia (HCC)   Fever   Hypotension   Protein-calorie malnutrition, severe    Discharge Instructions  Discharge Instructions     Diet - low sodium heart healthy   Complete by: As directed    Discharge instructions   Complete by: As directed    Follow with Primary MD [name],   Get CBC, CMP,  checked  by Primary MD next visit.    Activity: As tolerated with Full fall precautions use walker/cane &  assistance as needed   Disposition Home    Diet: Renal diet , with feeding assistance and aspiration precautions.    On your next visit with your primary care physician please Get Medicines  reviewed and adjusted.   Please request your Prim.MD to go over all Hospital Tests and Procedure/Radiological results at the follow up, please get all Hospital records sent to your Prim MD by signing hospital release before you go home.   If you experience worsening of your admission symptoms, develop shortness of breath, life threatening emergency, suicidal or homicidal thoughts you must seek medical attention immediately by calling 911 or calling your MD immediately  if symptoms less severe.  You Must read complete instructions/literature along with all the possible adverse reactions/side effects for all the Medicines you take and that have been prescribed to you. Take any new Medicines after you have completely understood and accpet all the possible adverse reactions/side effects.   Do not drive, operating heavy machinery, perform activities at heights, swimming or participation in water activities or provide baby sitting services if your were admitted for syncope or siezures until you have seen by Primary MD or a Neurologist and advised to do so again.  Do not drive when taking Pain medications.    Do not take more than prescribed Pain, Sleep and Anxiety Medications  Special Instructions: If you have smoked or chewed Tobacco  in the last 2 yrs please stop smoking, stop any regular Alcohol  and or any Recreational drug use.  Wear Seat belts while driving.   Please note  You were cared for by a hospitalist during your hospital stay. If you have any questions about your discharge medications or the care you received while you were in the hospital after you are discharged, you can call the unit and asked to speak with the hospitalist on call if the hospitalist that took care of you is not available. Once you are discharged, your primary care physician will handle any further medical issues. Please note that NO REFILLS for any discharge medications will be authorized once you are discharged,  as it is imperative that you return to your primary care physician (or establish a relationship with a primary care physician if you do not have one) for your aftercare needs so that they can reassess your need for medications and monitor your lab values.   Increase activity slowly   Complete by: As directed    No wound care   Complete by: As directed       Allergies as of 09/13/2021       Reactions   Vancomycin Shortness Of Breath, Itching        Medication List     STOP taking these medications    amLODipine 10 MG tablet Commonly known as: NORVASC   hydrALAZINE 25 MG tablet Commonly known as: APRESOLINE   metoprolol tartrate 100 MG tablet Commonly known as: LOPRESSOR   metoprolol tartrate 50 MG tablet Commonly known as: LOPRESSOR       TAKE these medications    albuterol 108 (90 Base) MCG/ACT inhaler Commonly known as: VENTOLIN HFA Inhale 2 puffs into the lungs 2 (two) times daily as needed for shortness of breath or wheezing.   apixaban 2.5 MG Tabs tablet Commonly known as: ELIQUIS Take 1 tablet (2.5 mg total) by mouth 2 (two) times daily.   cinacalcet 60 MG tablet Commonly known as: SENSIPAR Take 1 tablet (60 mg total) by mouth every evening.   diltiazem  240 MG 24 hr capsule Commonly known as: CARDIZEM CD Take 1 capsule (240 mg total) by mouth daily.   feeding supplement Liqd Take 237 mLs by mouth 2 (two) times daily between meals.   fluticasone furoate-vilanterol 100-25 MCG/ACT Aepb Commonly known as: BREO ELLIPTA Inhale 1 puff into the lungs daily.   midodrine 10 MG tablet Commonly known as: PROAMATINE Take 1 tablet (10 mg total) by mouth 3 (three) times daily with meals.   multivitamin Tabs tablet Take 1 tablet by mouth at bedtime.   sevelamer carbonate 800 MG tablet Commonly known as: RENVELA Take 800 mg by mouth 3 (three) times daily with meals.        Follow-up Information     [facility], [place], [place] Follow up.   Specialty: Physician  Assistant Why: hospital discharge follow up, pcp to refer you to neurology Contact information: [place] [address] [phone]         [name], DO .   Specialty: Cardiology Contact information: [address] [place] [address] [phone]         [facility] Follow up.   Specialty: [place] Why: Known as [facility]: Physical and Occupational Therapy-office to call with visit times. Contact information: [place] [address] [phone]                Allergies  Allergen Reactions   Vancomycin Shortness Of Breath and Itching    Consultations: Renal cardiology   Procedures/Studies: DG Chest 1 View  Result Date: 09/11/2021 CLINICAL DATA:  Post thoracentesis. EXAM: PORTABLE CHEST 1 VIEW COMPARISON:  IR ultrasound, earlier same day. Chest XR, 09/10/2021 and 09/07/2018. CT chest, 08/26/2021. FINDINGS: Support lines: RIGHT IJ CVC with catheter tip at the proximal RIGHT atrium. Overlying pacer leads. Enlargement of the cardiac silhouette. Aortic arch calcifications. Interstitial thickening and coarse bilateral perihilar opacities. No focal consolidation. Trace residual RIGHT pleural effusion. No pneumothorax. Cervical fusion hardware. No interval osseous abnormality. IMPRESSION: 1. Interval removal of RIGHT pleural effusion with trace residual. No postprocedure pneumothorax. 2. Cardiomegaly with pulmonary findings consistent with pulmonary edema. 3. Aortic Atherosclerosis (ICD10-I70.0). Electronically Signed   By: Michaelle Birks M.D.   On: 09/11/2021 13:32   DG Chest 1 View  Result Date: 08/25/2021 CLINICAL DATA:  Chest pain since last night, end-stage renal disease on hemodialysis, nonproductive cough, CHF, COPD, hypertension, asthma EXAM: CHEST  1 VIEW COMPARISON:  Portable exam 1842 hours compared to 08/11/2021 FINDINGS: RIGHT jugular dual lumen central venous  catheter with tip projecting over SVC. Enlargement of cardiac silhouette with pulmonary vascular congestion. Diffuse pulmonary infiltrates increased from previous exam consistent with pulmonary edema. Persistent RIGHT pleural effusion and basilar atelectasis. No pneumothorax. Atherosclerotic calcification aortic arch. Prior cervical spine fusion. IMPRESSION: Pulmonary edema consistent with CHF versus fluid overload, increased from previous exam. Persistent RIGHT pleural effusion and basilar atelectasis. Aortic Atherosclerosis (ICD10-I70.0). Electronically Signed   By: Lavonia Dana M.D.   On: 08/25/2021 18:49   DG Chest 2 View  Result Date: 08/28/2021 CLINICAL DATA:  SOB EXAM: CHEST - 2 VIEW COMPARISON:  08/25/2021 FINDINGS: Right chest wall dialysis catheter is again noted with tip projecting over the distal SVC. Stable cardiomediastinal contours. Small right pleural effusion is unchanged. Atelectasis and or airspace disease within the right lower lobe is unchanged. Diffuse hazy lung opacities are again seen compatible with pulmonary edema. IMPRESSION: Persistent right pleural effusion with overlying atelectasis/airspace disease. Unchanged mild  diffuse edema. Electronically Signed   By: Kerby Moors M.D.   On: 08/28/2021 18:13   DG CHEST PORT 1 VIEW  Result Date: 09/10/2021 CLINICAL DATA:  Shortness of breath.  Hypoxemia. EXAM: PORTABLE CHEST 1 VIEW COMPARISON:  09/07/2021 FINDINGS: Central line remains in place with its tip in the SVC just above the right atrium. Persistent cardiomegaly. Worsening of pulmonary edema pattern. Bilateral effusions persist, right larger than left. Coexistent pneumonia not excluded. IMPRESSION: Some worsening of edema pattern. Persistent effusions, right larger than left. Electronically Signed   By: Nelson Chimes M.D.   On: 09/10/2021 10:29   DG Chest Port 1 View  Result Date: 09/07/2021 CLINICAL DATA:  Weakness and shortness of breath. EXAM: PORTABLE CHEST 1 VIEW  COMPARISON:  Most recent radiograph 08/28/2021. Chest CTA 08/26/2021 FINDINGS: Right-sided dialysis catheter unchanged in position. Cardiomegaly is stable. Increasing right pleural effusion and basilar atelectasis. Increasing pulmonary edema. No pneumothorax. Stable osseous structures. IMPRESSION: 1. Increasing right pleural effusion and basilar atelectasis. 2. Increasing pulmonary edema. Electronically Signed   By: Keith Rake M.D.   On: 09/07/2021 17:58   CT ANGIO CHEST AORTA W/CM & OR WO/CM  Addendum Date: 08/26/2021   ADDENDUM REPORT: 08/26/2021 08:08 ADDENDUM: Study discussed by telephone with Dr. Roosevelt Locks on 08/26/2021 at 0802 hours. Electronically Signed   By: Genevie Ann M.D.   On: 08/26/2021 08:08   Result Date: 08/26/2021 CLINICAL DATA:  61 year old male with chest pain and shortness of breath. Acute aortic syndrome suspected. EXAM: CT ANGIOGRAPHY CHEST WITH CONTRAST TECHNIQUE: Multidetector CT imaging of the chest was performed using the standard protocol both prior to and during bolus administration of intravenous contrast. Multiplanar CT image reconstructions and MIPs were obtained to evaluate the vascular anatomy. CONTRAST:  90mL OMNIPAQUE IOHEXOL 350 MG/ML SOLN COMPARISON:  CTA chest 06/30/2021. FINDINGS: Cardiovascular: Study designed for aortic IV contrast timing but there is also good opacification of the bilateral pulmonary arteries. Superimposed respiratory motion. Small volume thrombus in right upper lobe pulmonary artery branches, primarily nonocclusive series 6 images 69 through 71. No other convincing focal filling defect identified in the pulmonary arteries. Stable tortuosity and mild fusiform enlargement of the ascending aorta up to 42 mm diameter. Negative for aortic dissection or saccular aneurysm. Mild-to-moderate Calcified aortic atherosclerosis. Calcified coronary artery atherosclerosis and/or stents. Stable cardiomegaly. No pericardial effusion. Mediastinum/Nodes: Negative. No  mediastinal mass or lymphadenopathy. Lungs/Pleura: Moderate layering right pleural effusion has not significantly changed since October. There is confluent and enhancing compressive atelectasis in the dependent right upper lobe and right lower lobe. Smaller left pleural effusion with superimposed mild pleural thickening and calcification is stable. Stable associated left lower lobe atelectasis. Major airways remain patent. Diffuse superimposed centrilobular pulmonary ground-glass opacity in both lungs. Upper Abdomen: Negative visible liver, spleen, adrenal glands and bowel in the upper abdomen. Partially visible renal atrophy. Musculoskeletal: Previous lower cervical ACDF. Midthoracic flowing endplate osteophytes and ankylosis. Generalized increased bony sclerosis suggesting renal osteodystrophy. No acute or suspicious osseous lesion. Review of the MIP images confirms the above findings. IMPRESSION: 1. Positive for small and primarily nonocclusive right upper lobe pulmonary embolus (series 6, image 71), overall clinical significance doubtful. 2. Negative for acute aortic syndrome. Stable borderline aneurysmal Ascending thoracic aorta. Recommend annual imaging followup by CTA or MRA as detailed on October CTA. Aortic aneurysm NOS (ICD10-I71.9) Aortic Atherosclerosis (ICD10-I70.0). 3. Ongoing diffuse pulmonary ground-glass opacity suspicious for Acute Pulmonary Edema. Not significantly changed from October superimposed moderate layering right and small left pleural effusions  with compressive atelectasis. 4. Partially visible renal atrophy.  Renal osteodystrophy. Electronically Signed: By: Genevie Ann M.D. On: 08/26/2021 07:00   IR THORACENTESIS ASP PLEURAL SPACE W/IMG GUIDE  Result Date: 09/11/2021 INDICATION: Shortness of breath, bilateral right greater than left pleural effusions seen on previous chest x-ray. Request for therapeutic and diagnostic thoracentesis. EXAM: ULTRASOUND GUIDED RIGHT THORACENTESIS  MEDICATIONS: 8 mL 1% lidocaine COMPLICATIONS: None immediate. PROCEDURE: An ultrasound guided thoracentesis was thoroughly discussed with the patient and questions answered. The benefits, risks, alternatives and complications were also discussed. The patient understands and wishes to proceed with the procedure. Written consent was obtained. Ultrasound was performed to localize and mark an adequate pocket of fluid in the right chest. The area was then prepped and draped in the normal sterile fashion. 1% Lidocaine was used for local anesthesia. Under ultrasound guidance a 6 Fr Safe-T-Centesis catheter was introduced. Thoracentesis was performed. The catheter was removed and a dressing applied. FINDINGS: A total of approximately 1.3 L of hazy light brown fluid was removed. Samples were sent to the laboratory as requested by the clinical team. Post procedure chest X-ray reviewed, negative for pneumothorax. IMPRESSION: Successful ultrasound guided right thoracentesis yielding 1.3 L of pleural fluid. Read by: Durenda Guthrie, PA-C Electronically Signed   By: Sandi Mariscal M.D.   On: 09/11/2021 15:26      Subjective:  Patient is eager to go home today, he denies any chest pain, dyspnea at baseline.  Discharge Exam: Vitals:   09/13/21 0941 09/13/21 1000  BP: 109/61 (!) 107/57  Pulse: (!) 121 98  Resp: (!) 21 (!) 23  Temp:    SpO2: 95% 95%   Vitals:   09/13/21 0900 09/13/21 0930 09/13/21 0941 09/13/21 1000  BP: (!) 94/58 109/60 109/61 (!) 107/57  Pulse: 90 94 (!) 121 98  Resp: 19 (!) 21 (!) 21 (!) 23  Temp:      TempSrc:      SpO2: 93% 93% 95% 95%  Weight:      Height:        General: Pt is alert, awake, not in acute distress, frail, chronically ill-appearing, cachectic Cardiovascular: Irregular irregular, S1/S2 +, no rubs, no gallops Respiratory: CTA bilaterally, no wheezing, no rhonchi Abdominal: Soft, NT, ND, bowel sounds + Extremities: no edema, no cyanosis    The results of significant  diagnostics from this hospitalization (including imaging, microbiology, ancillary and laboratory) are listed below for reference.     Microbiology: Recent Results (from the past 240 hour(s))  Blood culture (routine x 2)     Status: None   Collection Time: 09/07/21  5:41 PM   Specimen: BLOOD LEFT ARM  Result Value Ref Range Status   Specimen Description BLOOD LEFT ARM  Final   Special Requests   Final    BOTTLES DRAWN AEROBIC AND ANAEROBIC Blood Culture results may not be optimal due to an inadequate volume of blood received in culture bottles   Culture   Final    NO GROWTH 5 DAYS Performed at [facility], [place] [address], [place], [place] [address]    Report Status 09/12/2021 FINAL  Final  Blood culture (routine x 2)     Status: None   Collection Time: 09/07/21  5:55 PM   Specimen: BLOOD  Result Value Ref Range Status   Specimen Description BLOOD LEFT ANTECUBITAL  Final   Special Requests   Final    BOTTLES DRAWN AEROBIC ONLY Blood Culture results may not be optimal due  to an inadequate volume of blood received in culture bottles   Culture   Final    NO GROWTH 5 DAYS Performed at [facility], [place] [address], [place], [place] [address]    Report Status 09/12/2021 FINAL  Final  Culture, body fluid w Gram Stain-bottle     Status: None (Preliminary result)   Collection Time: 09/11/21  1:16 PM   Specimen: Fluid  Result Value Ref Range Status   Specimen Description FLUID RIGHT PLEURAL  Final   Special Requests BOTTLES DRAWN AEROBIC AND ANAEROBIC 10CC  Final   Culture   Final    NO GROWTH 2 DAYS Performed at [facility], [place] [address] [place], [place] [address]    Report Status PENDING  Incomplete  Gram stain     Status: None   Collection Time: 09/11/21  1:16 PM   Specimen: Fluid  Result Value Ref Range Status   Specimen Description FLUID RIGHT PLEURAL  Final   Special Requests NONE  Final   Gram Stain   Final    RARE WBC PRESENT, PREDOMINANTLY  MONONUCLEAR NO ORGANISMS SEEN Performed at [facility], [place] [address], [place], [place] [address]    Report Status 09/11/2021 FINAL  Final     Labs: BNP (last 3 results) Recent Labs    09/10/21 1038 09/10/21 1631 09/11/21 0608  BNP >4,500.0* >4,500.0* 6,712.4*   Basic Metabolic Panel: Recent Labs  Lab 09/07/21 1723 09/07/21 1741 09/07/21 1741 09/07/21 1750 09/08/21 0214 09/10/21 1122 09/12/21 0928 09/13/21 0750  NA  --  139   < > 133* 138 135 136 136  K  --  7.0*   < > 7.0* 4.1 4.2 4.4 4.7  CL  --  97*   < > 101 98 95* 96* 97*  CO2  --  25  --   --  28 26 26 26   GLUCOSE  --  78   < > 79 94 96 136* 88  BUN  --  46*   < > 68* 21 30* 27* 38*  CREATININE  --  6.29*   < > 6.30* 3.78* 4.52* 3.96* 5.14*  CALCIUM  --  10.4*  --   --  10.0 10.5* 10.1 10.3  MG 2.7*  --   --   --  2.2  --   --   --   PHOS  --   --   --   --  4.4  --  5.2* 5.4*   < > = values in this interval not displayed.   Liver Function Tests: Recent Labs  Lab 09/07/21 1723 09/08/21 0214 09/12/21 0928 09/13/21 0750  AST 22 18  --   --   ALT 18 18  --   --   ALKPHOS 97 104  --   --   BILITOT 1.3* 1.3*  --   --   PROT 7.3 7.1  --   --   ALBUMIN 3.2* 3.2* 2.6* 2.6*   No results for input(s): LIPASE, AMYLASE in the last 168 hours. No results for input(s): AMMONIA in the last 168 hours. CBC: Recent Labs  Lab 09/07/21 1741 09/07/21 1750 09/08/21 0214 09/10/21 1122 09/12/21 0928  WBC 5.3  --  5.1 5.4 5.1  NEUTROABS  --   --  3.6 4.2  --   HGB 9.9* 11.2* 10.1* 9.4* 10.4*  HCT 32.7* 33.0* 32.2* 30.6* 33.3*  MCV 108.6*  --  107.3* 106.3* 104.1*  PLT  151  --  158 173 157   Cardiac Enzymes: Recent Labs  Lab 09/08/21 0213  CKTOTAL 17*   BNP: Invalid input(s): POCBNP CBG: Recent Labs  Lab 09/10/21 2010 09/11/21 2209  GLUCAP 109* 75   D-Dimer No results for input(s): DDIMER in the last 72 hours. Hgb A1c No results for input(s): HGBA1C in the last 72 hours. Lipid Profile No  results for input(s): CHOL, HDL, LDLCALC, TRIG, CHOLHDL, LDLDIRECT in the last 72 hours. Thyroid function studies No results for input(s): TSH, T4TOTAL, T3FREE, THYROIDAB in the last 72 hours.  Invalid input(s): FREET3 Anemia work up No results for input(s): VITAMINB12, FOLATE, FERRITIN, TIBC, IRON, RETICCTPCT in the last 72 hours. Urinalysis    Component Value Date/Time   COLORURINE YELLOW 05/24/2017 1729   APPEARANCEUR HAZY (A) 05/24/2017 1729   LABSPEC 1.011 05/24/2017 1729   PHURINE 5.0 05/24/2017 1729   GLUCOSEU 50 (A) 05/24/2017 1729   HGBUR SMALL (A) 05/24/2017 1729   BILIRUBINUR NEGATIVE 05/24/2017 1729   KETONESUR NEGATIVE 05/24/2017 1729   PROTEINUR 100 (A) 05/24/2017 1729   UROBILINOGEN 0.2 04/09/2015 1527   NITRITE NEGATIVE 05/24/2017 1729   LEUKOCYTESUR TRACE (A) 05/24/2017 1729   Sepsis Labs Invalid input(s): PROCALCITONIN,  WBC,  LACTICIDVEN Microbiology Recent Results (from the past 240 hour(s))  Blood culture (routine x 2)     Status: None   Collection Time: 09/07/21  5:41 PM   Specimen: BLOOD LEFT ARM  Result Value Ref Range Status   Specimen Description BLOOD LEFT ARM  Final   Special Requests   Final    BOTTLES DRAWN AEROBIC AND ANAEROBIC Blood Culture results may not be optimal due to an inadequate volume of blood received in culture bottles   Culture   Final    NO GROWTH 5 DAYS Performed at [facility], [place] [address], [place], [place] [address]    Report Status 09/12/2021 FINAL  Final  Blood culture (routine x 2)     Status: None   Collection Time: 09/07/21  5:55 PM   Specimen: BLOOD  Result Value Ref Range Status   Specimen Description BLOOD LEFT ANTECUBITAL  Final   Special Requests   Final    BOTTLES DRAWN AEROBIC ONLY Blood Culture results may not be optimal due to an inadequate volume of blood received in culture bottles   Culture   Final    NO GROWTH 5 DAYS Performed at [facility], [place] [address] [place], [place] [address]     Report Status 09/12/2021 FINAL  Final  Culture, body fluid w Gram Stain-bottle     Status: None (Preliminary result)   Collection Time: 09/11/21  1:16 PM   Specimen: Fluid  Result Value Ref Range Status   Specimen Description FLUID RIGHT PLEURAL  Final   Special Requests BOTTLES DRAWN AEROBIC AND ANAEROBIC 10CC  Final   Culture   Final    NO GROWTH 2 DAYS Performed at [facility], [place] [address] [place], [place] [address]    Report Status PENDING  Incomplete  Gram stain     Status: None   Collection Time: 09/11/21  1:16 PM   Specimen: Fluid  Result Value Ref Range Status   Specimen Description FLUID RIGHT PLEURAL  Final   Special Requests NONE  Final   Gram Stain   Final    RARE WBC PRESENT, PREDOMINANTLY MONONUCLEAR NO ORGANISMS SEEN Performed at [facility], [place] [address], [place], [place]  [address]    Report Status 09/11/2021 FINAL  Final     Time coordinating discharge: Over 30 minutes  SIGNED:   Phillips Climes, MD  [facility] 09/13/2021, 11:43 AM Pager   If 7PM-7AM, please contact night-coverage [email or web address] Password [email or web address]

## 2021-09-13 NOTE — Care Management (Signed)
Patient had home oxygen concentrator at home  from October.Messaged [facility] about oxygen at 1310. They replied at 1325, that they would need new saturations ( walking) and then the orders could be put in based on that, however the patient was discharged.  This CM called the patient to tell him the plan., that his PCP will have to do walking saturations on his next FU appointment, and order the oxygen per [facility]

## 2021-09-13 NOTE — TOC Transition Note (Signed)
Transition of Care [facility]) - CM/SW Discharge Note   Patient Details  Name: Thomas Robinson MRN: [id number] Date of Birth: 1961/07/28  Transition of Care [facility] CM/SW Contact:  Verdell Carmine, RN Phone Number: 09/13/2021, 11:36 AM   Clinical Narrative:     Patient will DC today. HH orders in and set up peevishly with [facility].[place] Representative Amy notified of DC today. Has all DME needed at home  Final next level of care: Home w Bureau Barriers to Discharge: No Barriers Identified   Patient Goals and CMS Choice Patient states their goals for this hospitalization and ongoing recovery are:: to return home   Choice offered to / list presented to : Patient  Discharge Placement                       Discharge Plan and Services In-house Referral: NA Discharge Planning Services: CM Consult Post Acute Care Choice: Home Health          DME Arranged: N/A DME Agency: NA       HH Arranged: PT, OT HH Agency: [place] Date [place]: 09/08/21 Time [place]: 1448 Representative spoke with at [place]: Amy  Social Determinants of Health ([place]) Interventions     Readmission Risk Interventions Readmission Risk Prevention Plan 09/08/2021 01/15/2021 09/11/2020  Transportation Screening Complete Complete Complete  Medication Review [employer] photographer) Complete Complete -  PCP or Specialist appointment within 3-5 days of discharge - - Not Complete  PCP/Specialist Appt Not Complete comments - - first available appt is 09/26/20  [place] or Home Care Consult Complete Complete (No Data)  SW Recovery Care/Counseling Consult Complete Complete Complete  Palliative Care Screening Not Applicable Not Applicable Not [place] Not Applicable Not Applicable Not Applicable  Some recent data might be hidden

## 2021-09-16 LAB — CULTURE, BODY FLUID W GRAM STAIN -BOTTLE: Culture: NO GROWTH

## 2021-09-17 ENCOUNTER — Ambulatory Visit: Payer: Medicare Other | Admitting: Cardiology

## 2021-09-17 IMAGING — CR DG PELVIS 1-2V
1 series · 1 of 1 positions shown · non-contrast
Comparison: None.

CLINICAL DATA: Pain following fall

EXAM:
PELVIS - 1-2 VIEW

[pelvis ap]
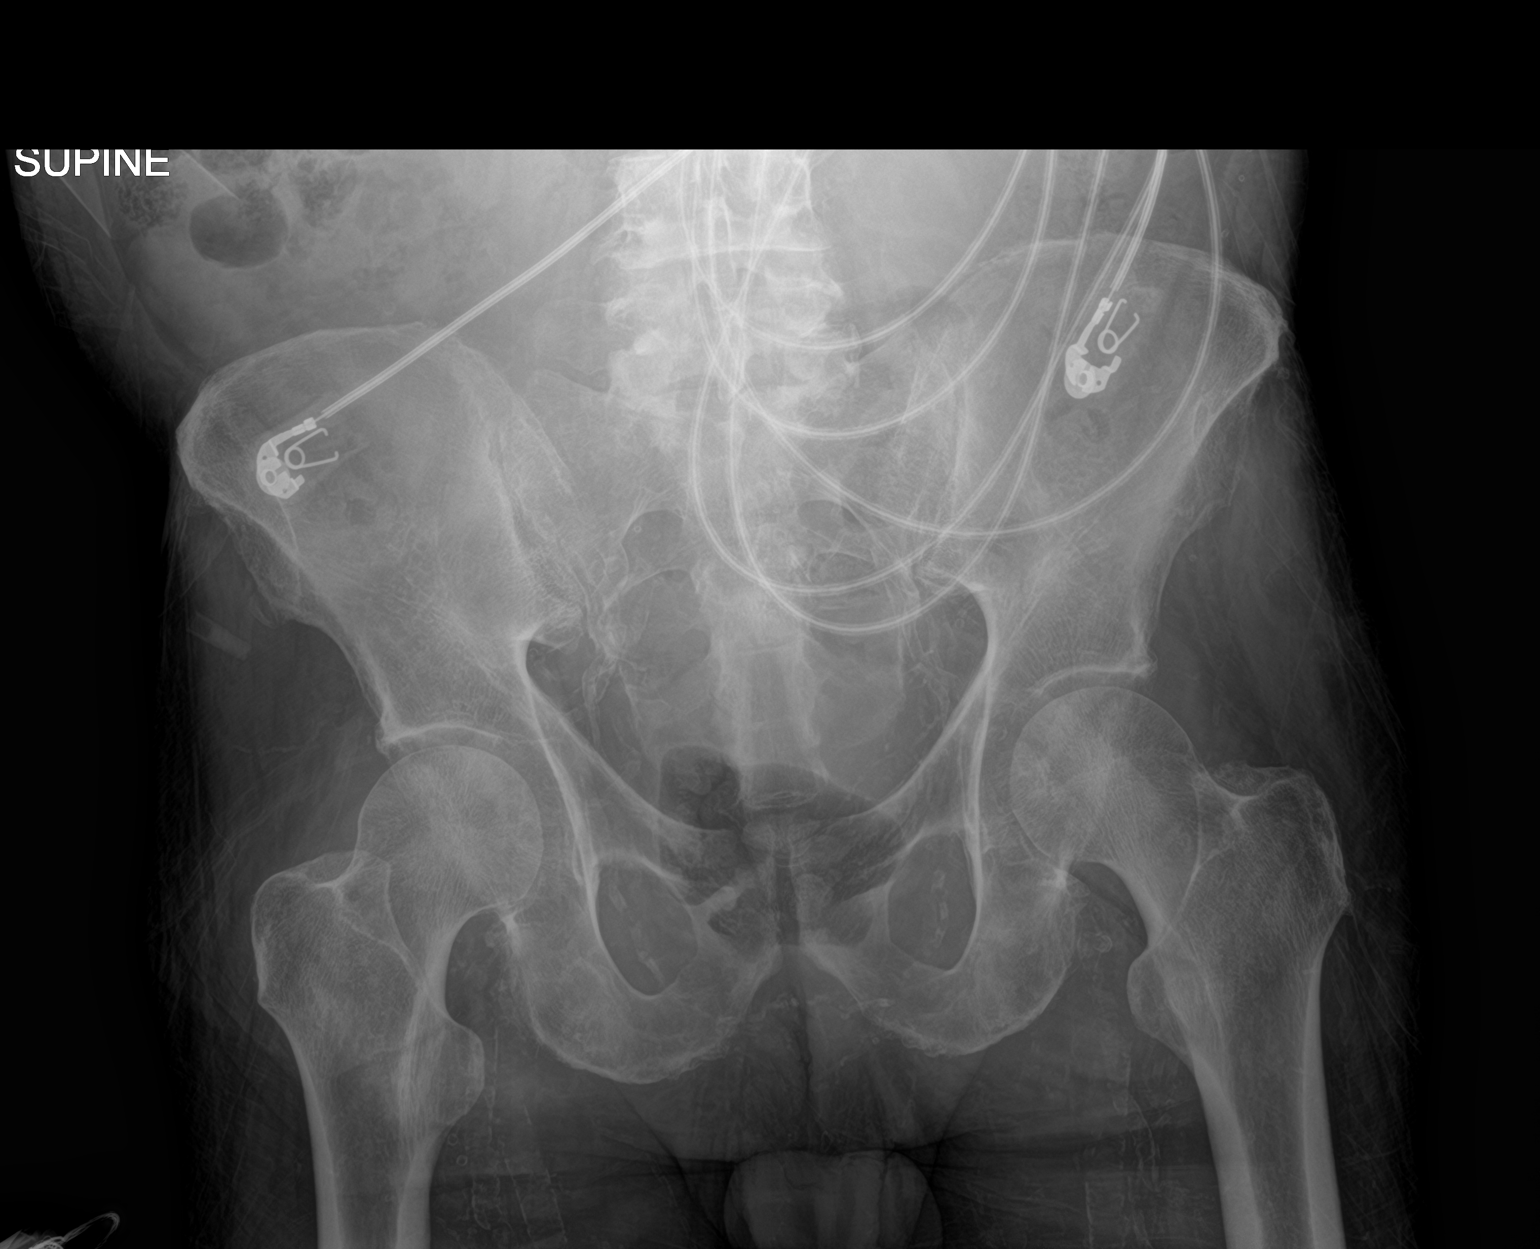

[1 of 1 positions shown; findings below may reference images not displayed]

FINDINGS: There is no evidence of pelvic fracture or dislocation. Joint spaces
appear normal. Multiple foci of vascular calcification noted
bilaterally.
IMPRESSION: No fracture or dislocation. No evident arthropathy. Multiple foci of
vascular calcification noted.

## 2021-09-17 IMAGING — CR DG ELBOW COMPLETE 3+V*L*
4 series · 4 of 4 positions shown · non-contrast
Comparison: None.

CLINICAL DATA: Pain following fall. Reported recent arm region
surgery

EXAM:
LEFT ELBOW - COMPLETE 3+ VIEW

[elbow ap]
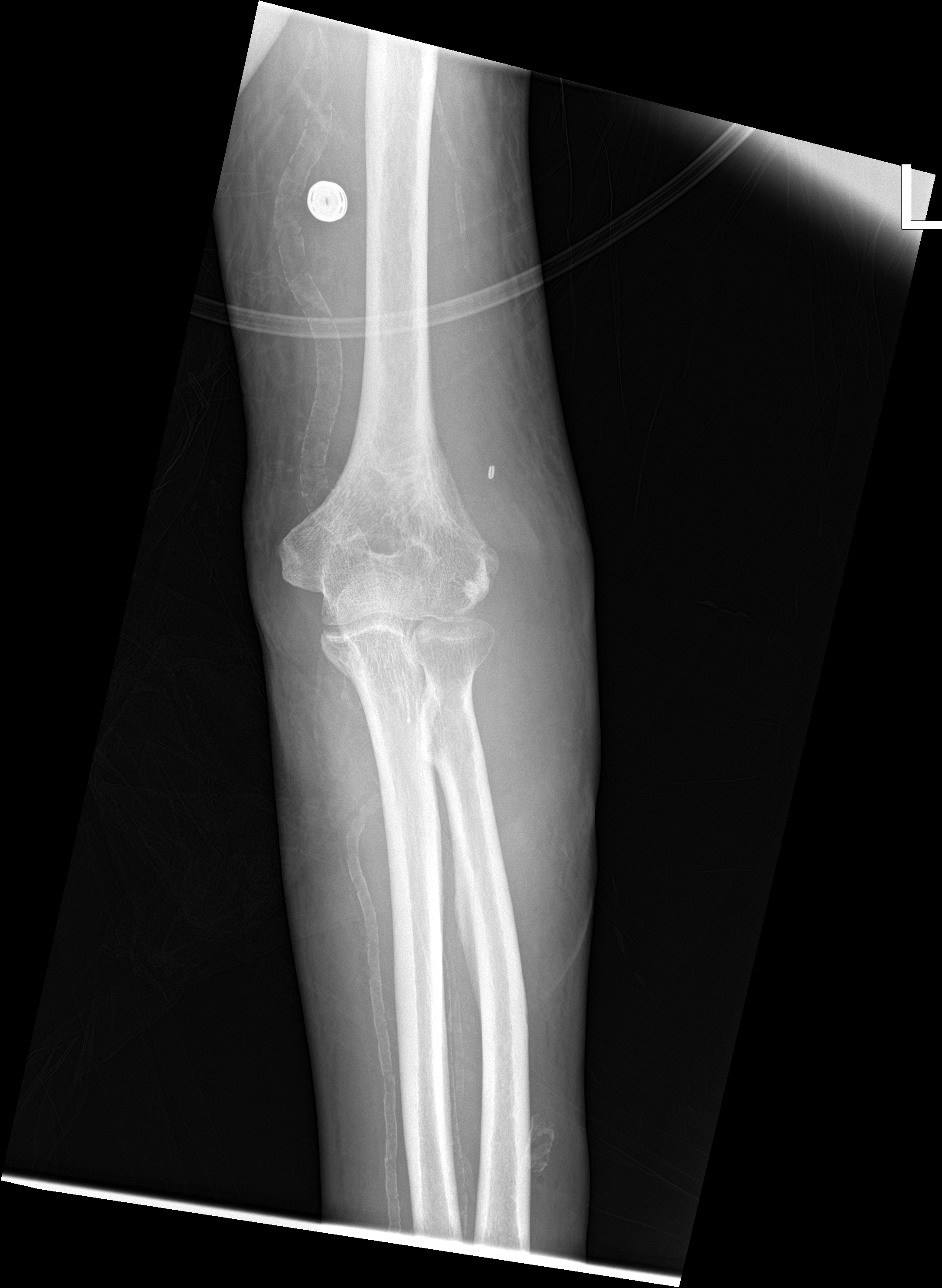

[elbow obl (1 of 2)]
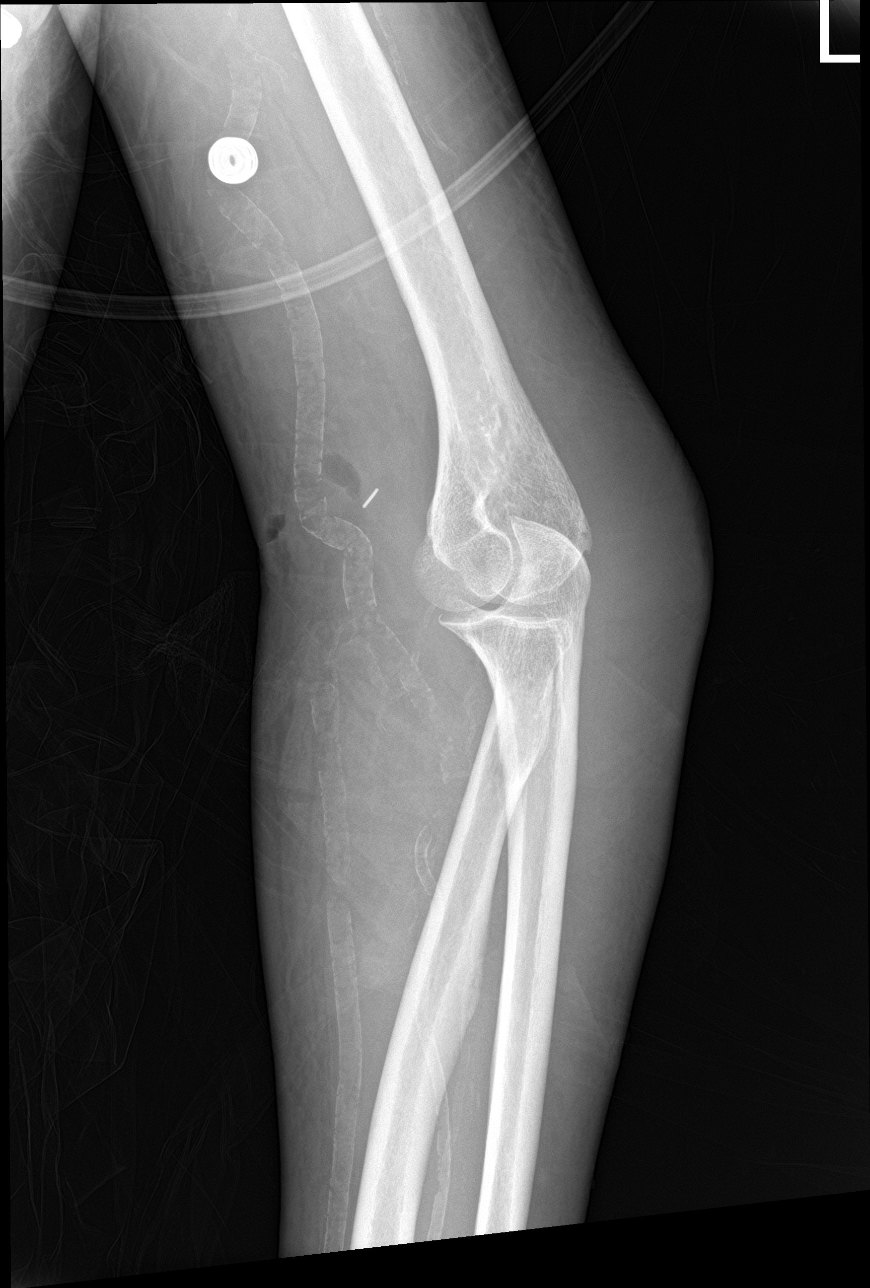

[elbow obl (2 of 2)]
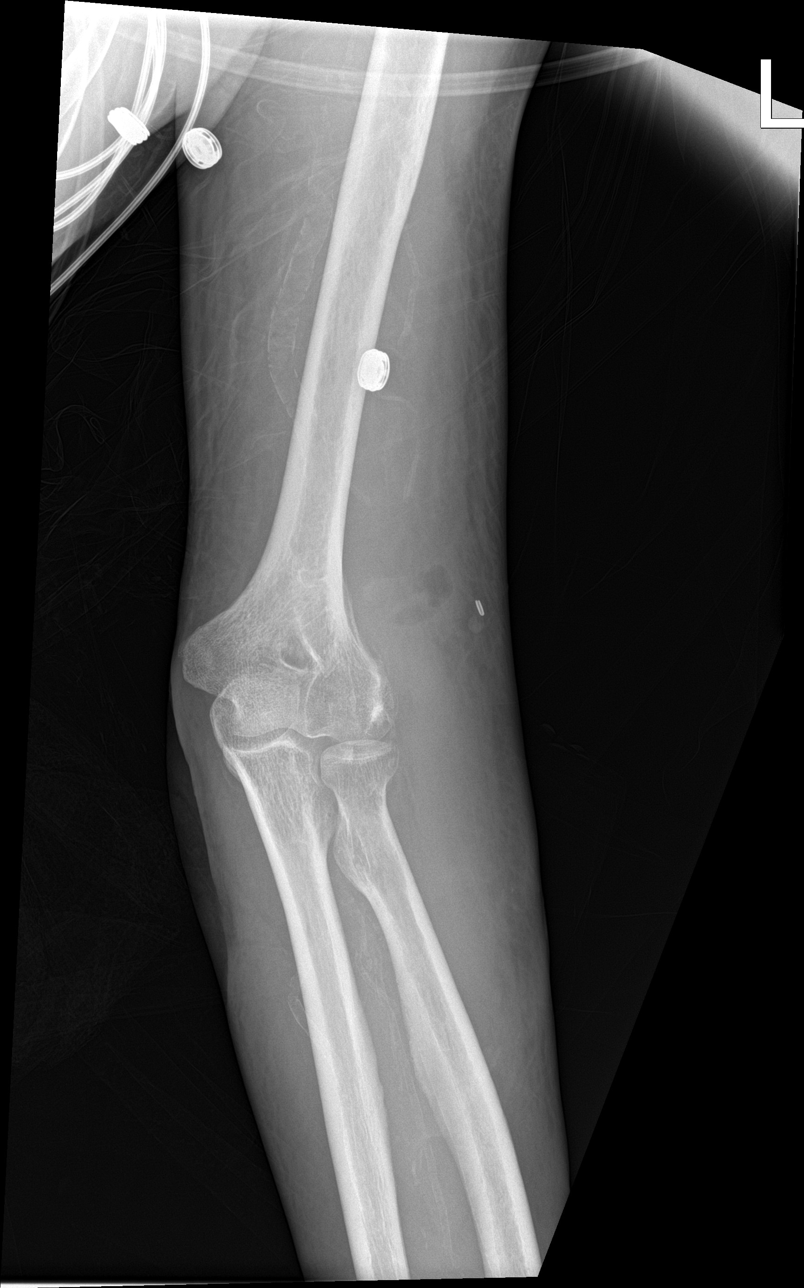

[elbow lat]
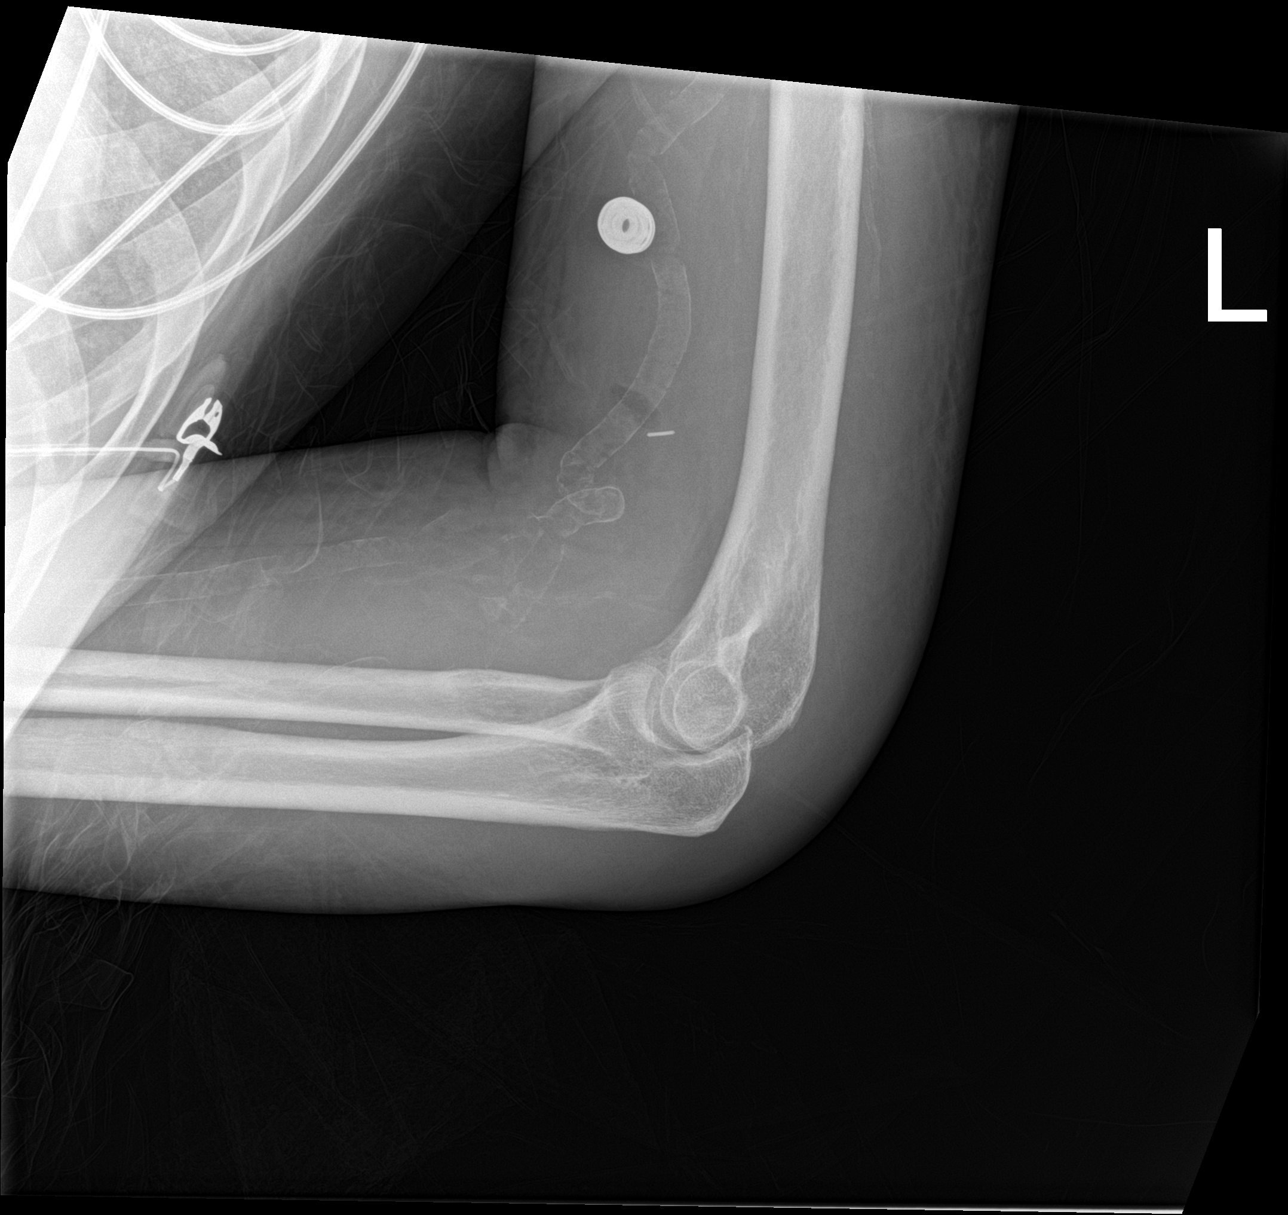

[4 of 4 positions shown; findings below may reference images not displayed]

FINDINGS: Frontal, lateral, and bilateral oblique views were obtained. There
is soft tissue swelling. There is no appreciable fracture or
dislocation. No joint effusion. No joint space narrowing or erosion.
Multiple foci of arterial vascular calcification noted. Note that
there is soft tissue air in the volar aspect of the distal humerus.
IMPRESSION: No fracture or dislocation. Soft tissue swelling and soft tissue air
present. A degree of infection within the soft tissues of the distal
upper arm region is questioned. Nearby surgical clips.

Multiple foci of arterial vascular calcification noted.

## 2021-09-17 IMAGING — CR DG CHEST 1V
1 series · 1 of 1 positions shown · non-contrast
Comparison: September 19, 2020

CLINICAL DATA: Pain following fall

EXAM:
CHEST  1 VIEW

[chest ap]
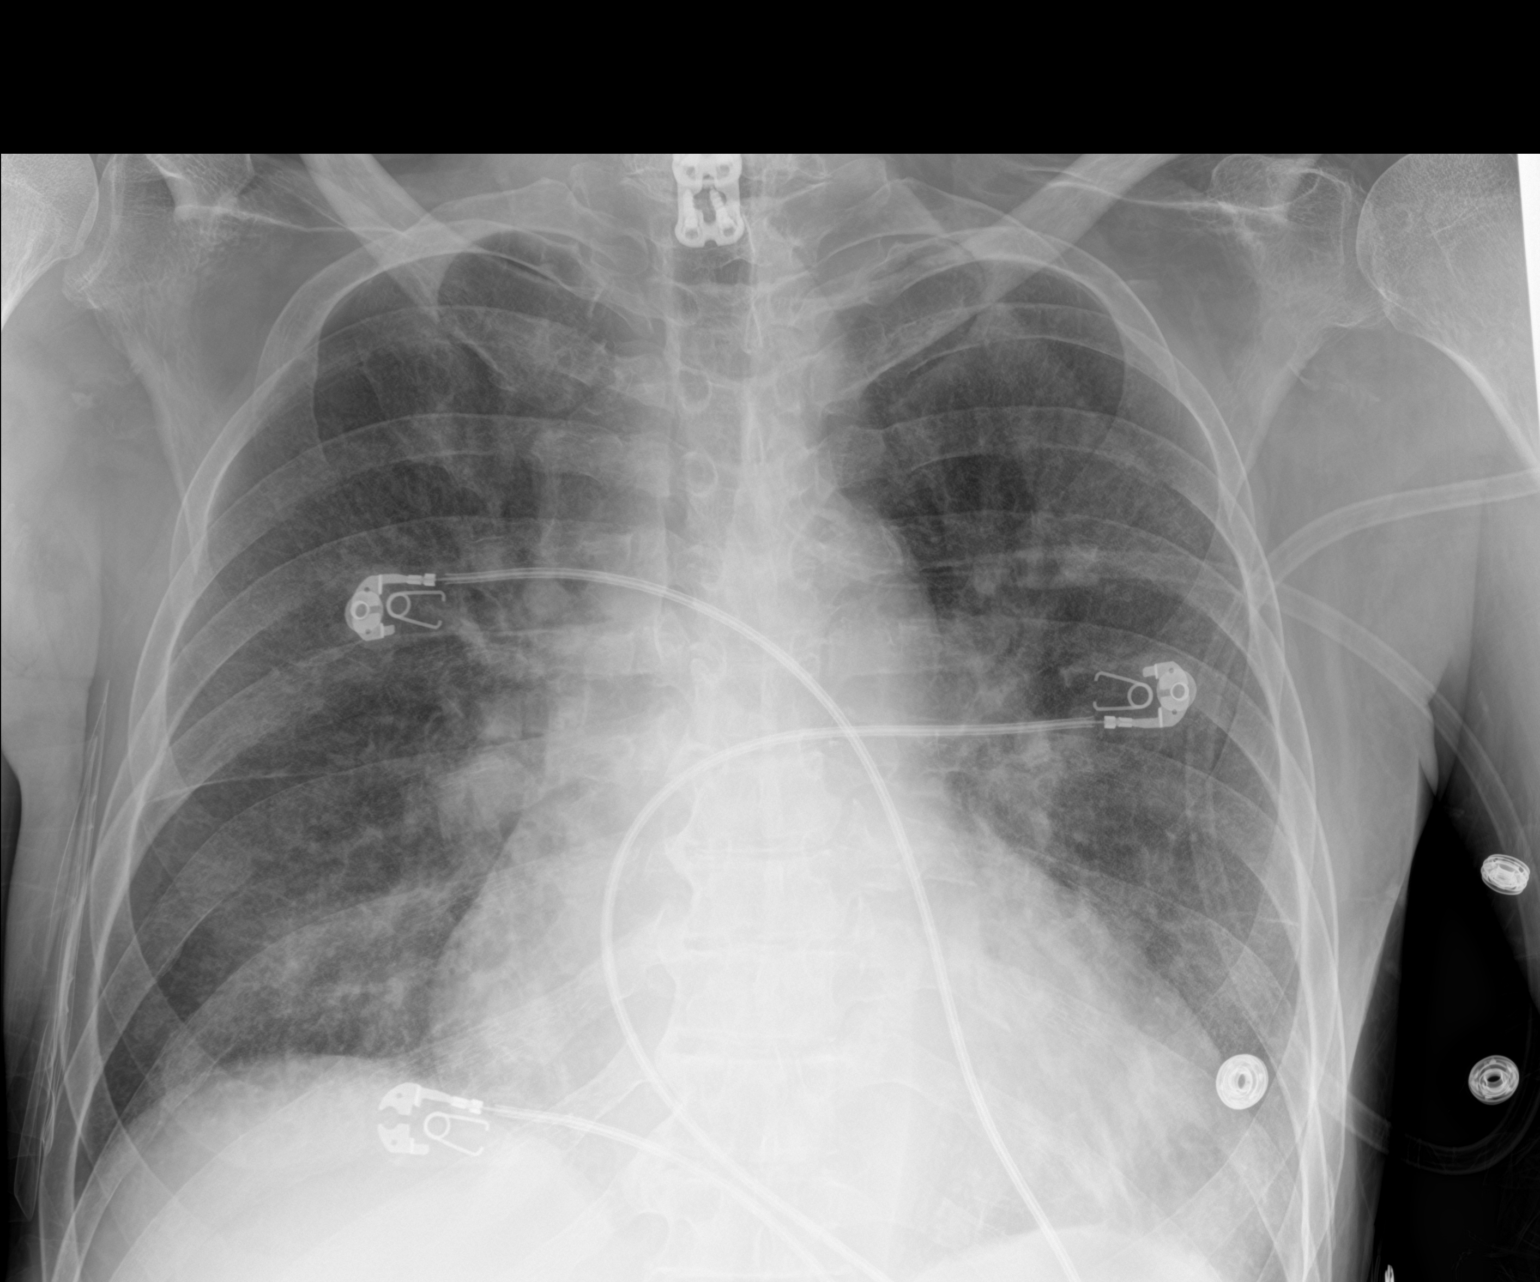

[1 of 1 positions shown; findings below may reference images not displayed]

FINDINGS: There is cardiomegaly with pulmonary venous hypertension. The
interstitium is mildly prominent without overt pulmonary edema. No
appreciable airspace opacity. No adenopathy. No pneumothorax. There
is a skin fold on the left. No fracture evident. Postoperative
change in lower cervical spine. There is aortic atherosclerosis.
IMPRESSION: Suspect underlying chronic bronchitis. No edema or consolidation.
There is cardiomegaly with pulmonary vascular congestion. No
pneumothorax. Aortic Atherosclerosis ([id number]).

## 2021-09-17 IMAGING — CT CT HEAD W/O CM
4 series · 16 of 47 positions shown, 18 images · non-contrast
Comparison: None.

CLINICAL DATA: Fall

EXAM:
CT HEAD WITHOUT CONTRAST
CT CERVICAL SPINE WITHOUT CONTRAST
TECHNIQUE: Multidetector CT imaging of the head and cervical spine was
performed following the standard protocol without intravenous
contrast. Multiplanar CT image reconstructions of the cervical spine
were also generated.

[Series 3: head wo · axial · 0.33mm/px · z∈[-146,-22]mm · 7 of 35 slices shown, 9 images]
[im 5/35  brain]
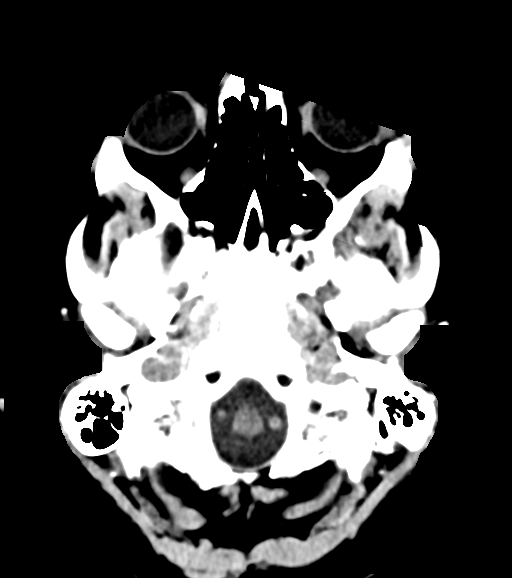
[im 5/35  bone]
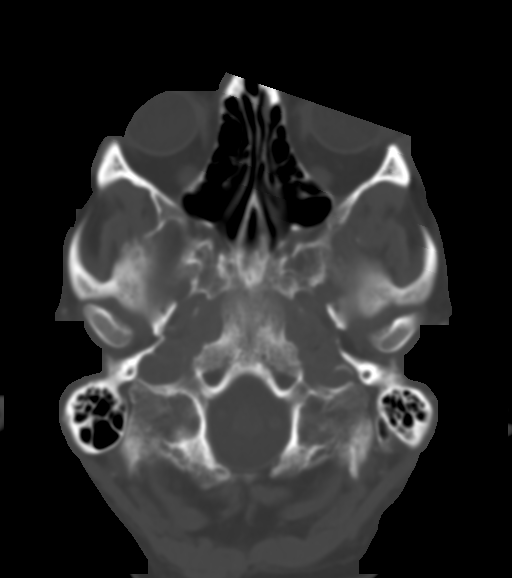
[im 9/35  brain]
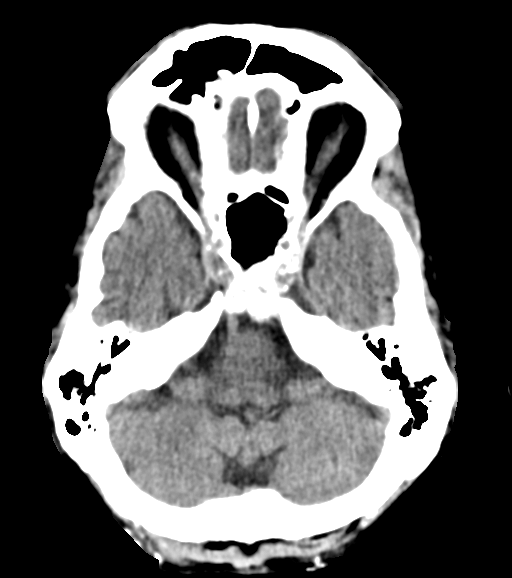
[im 13/35  brain]
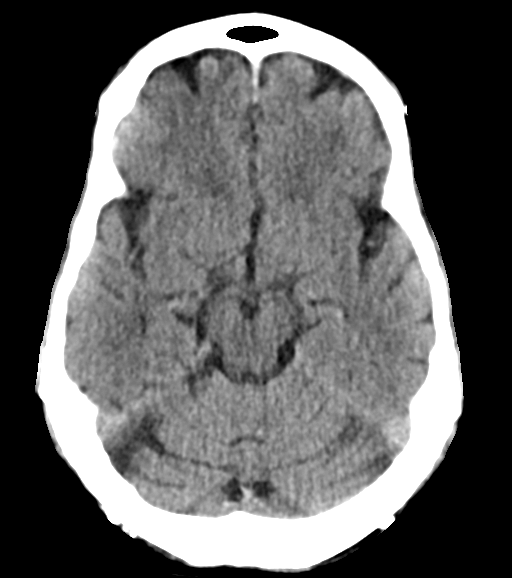
[im 18/35  brain]
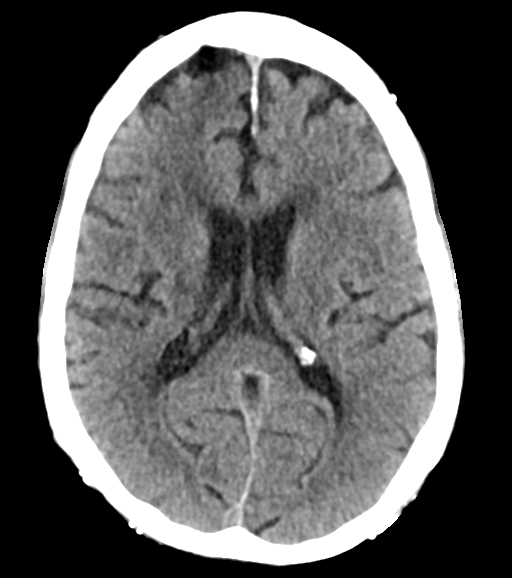
[im 22/35  brain]
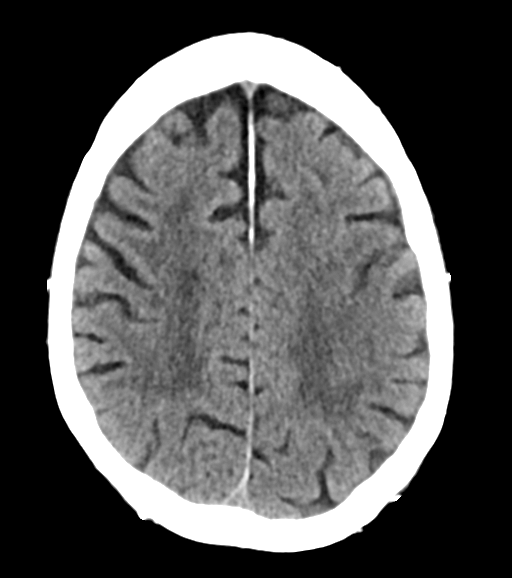
[im 22/35  bone]
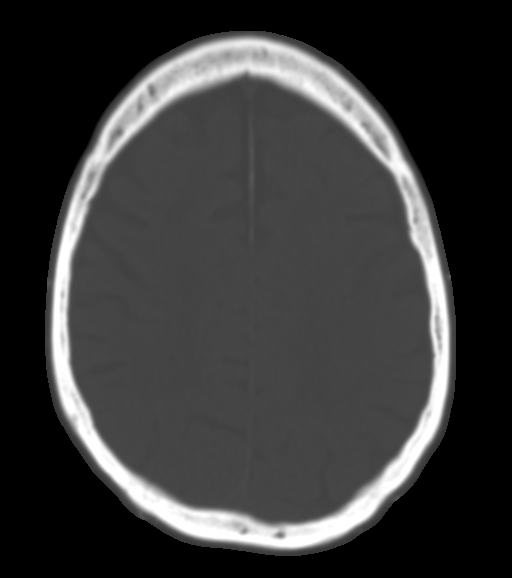
[im 26/35  brain]
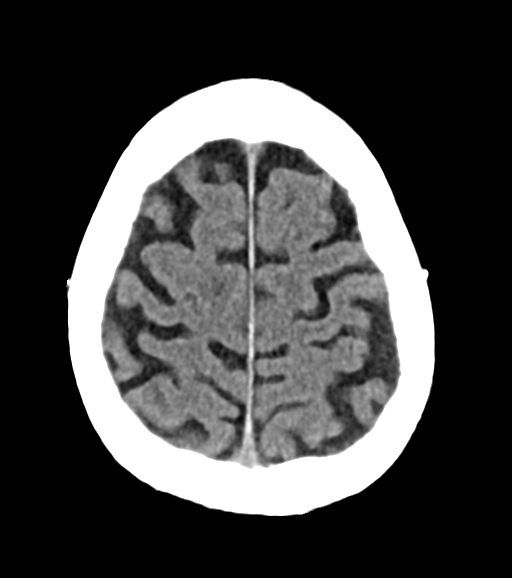
[im 30/35  brain]
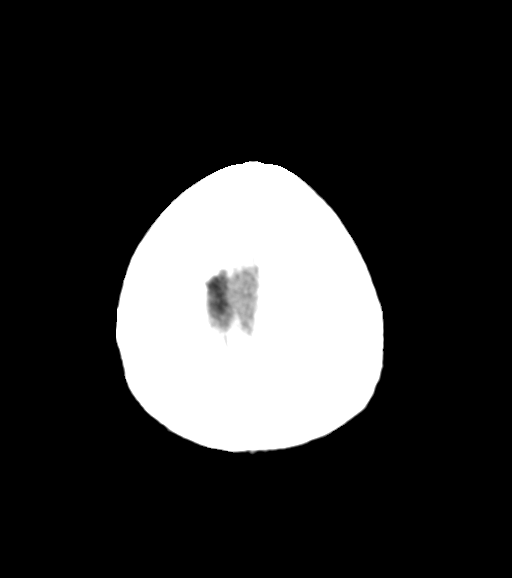

[Series 4: head bone · axial · 0.41mm/px · z∈[-130,-100]mm · 3 of 77 slices shown]
[im 8/77  bone]
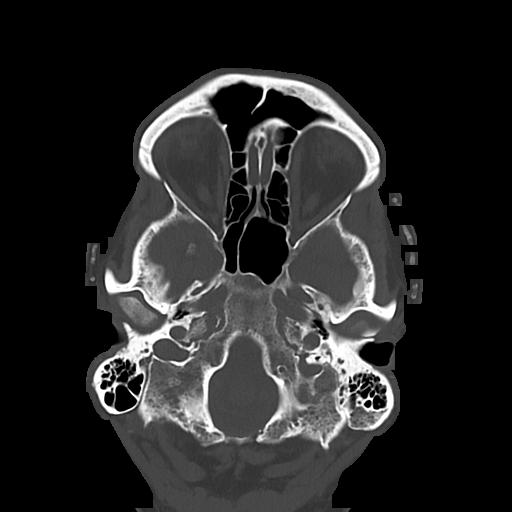
[im 16/77  bone]
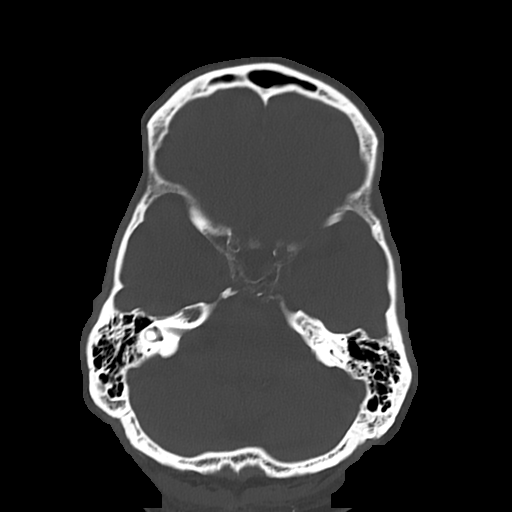
[im 23/77  bone]
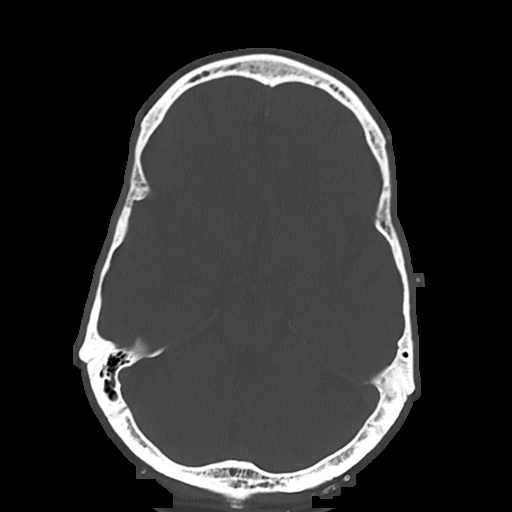

[Series 5: cor soft · coronal · 0.33mm/px · 3 of 65 slices shown]
[im 22/65  brain]
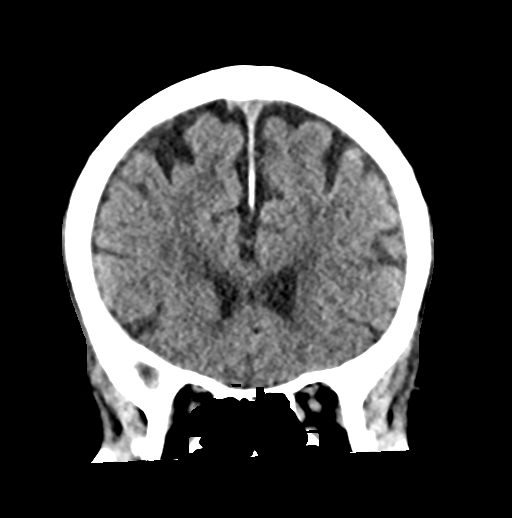
[im 29/65  brain]
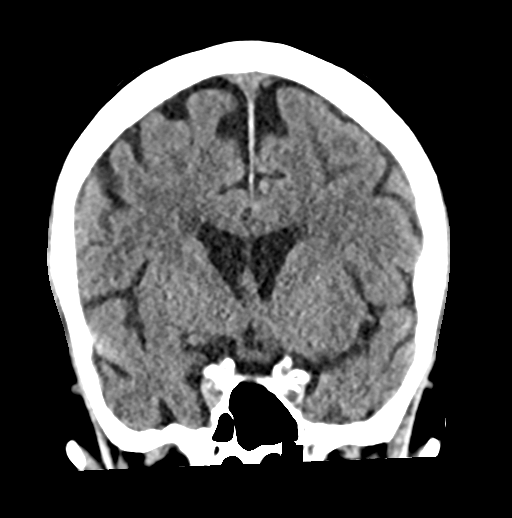
[im 36/65  brain]
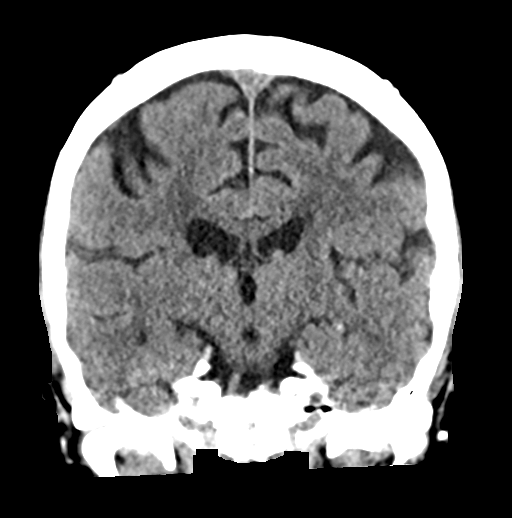

[Series 6: sag soft · sagittal · 0.34mm/px · 3 of 52 slices shown]
[im 18/52  brain]
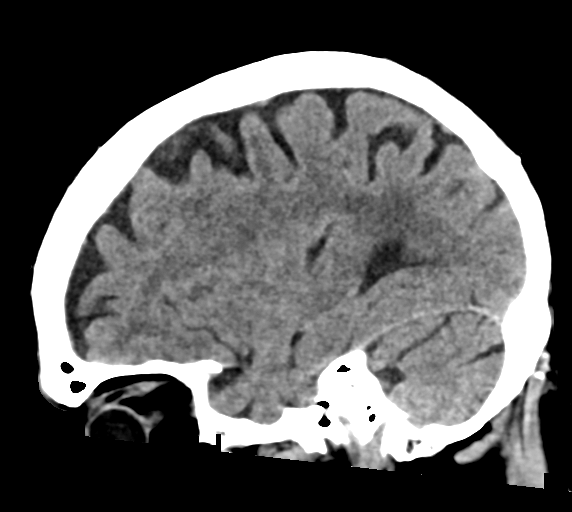
[im 26/52  brain]
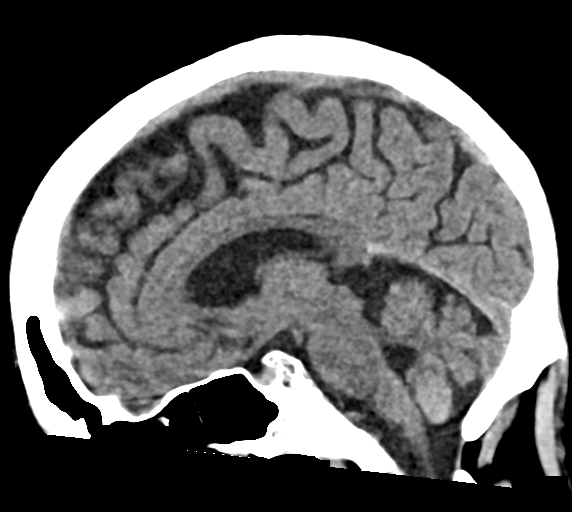
[im 35/52  brain]
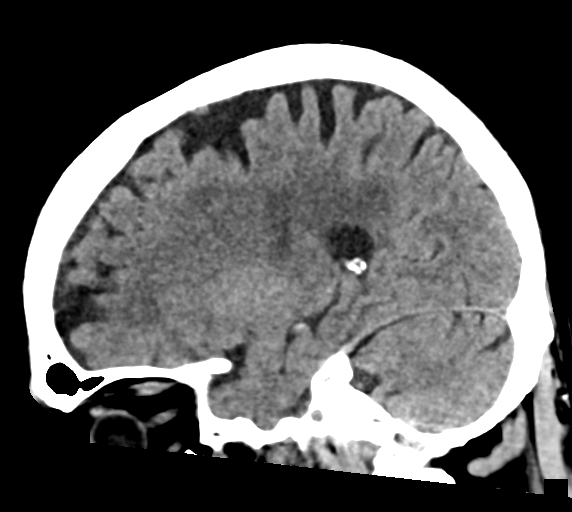

[16 of 47 positions shown; findings below may reference images not displayed]

FINDINGS: CT HEAD FINDINGS

Brain: No evidence of acute infarction, hemorrhage, hydrocephalus,
extra-axial collection or mass lesion/mass effect. Periventricular
white matter hypodensity.

Vascular: No hyperdense vessel or unexpected calcification.

Skull: Normal. Negative for fracture or focal lesion.

Sinuses/Orbits: No acute finding.

Other: None.

CT CERVICAL SPINE FINDINGS

Alignment: Degenerative straightening and reversal of the normal
cervical lordosis.

Skull base and vertebrae: No acute fracture. No primary bone lesion
or focal pathologic process.

Soft tissues and spinal canal: No prevertebral fluid or swelling. No
visible canal hematoma.

Disc levels: Status post anterior cervical discectomy and fusion of
C5 through C7.

Upper chest: Negative.

Other: None.
IMPRESSION: 1. No acute intracranial pathology. Small-vessel white matter
disease.
2. No fracture or static subluxation of the cervical spine.
3. Status post anterior cervical discectomy and fusion of C5 through
C7.

## 2021-09-17 IMAGING — CR DG FOREARM 2V*L*
2 series · 2 of 2 positions shown · non-contrast
Comparison: None.

CLINICAL DATA: Pain following fall.  Recent fistula surgery

EXAM:
LEFT FOREARM - 2 VIEW

[forearm ap]
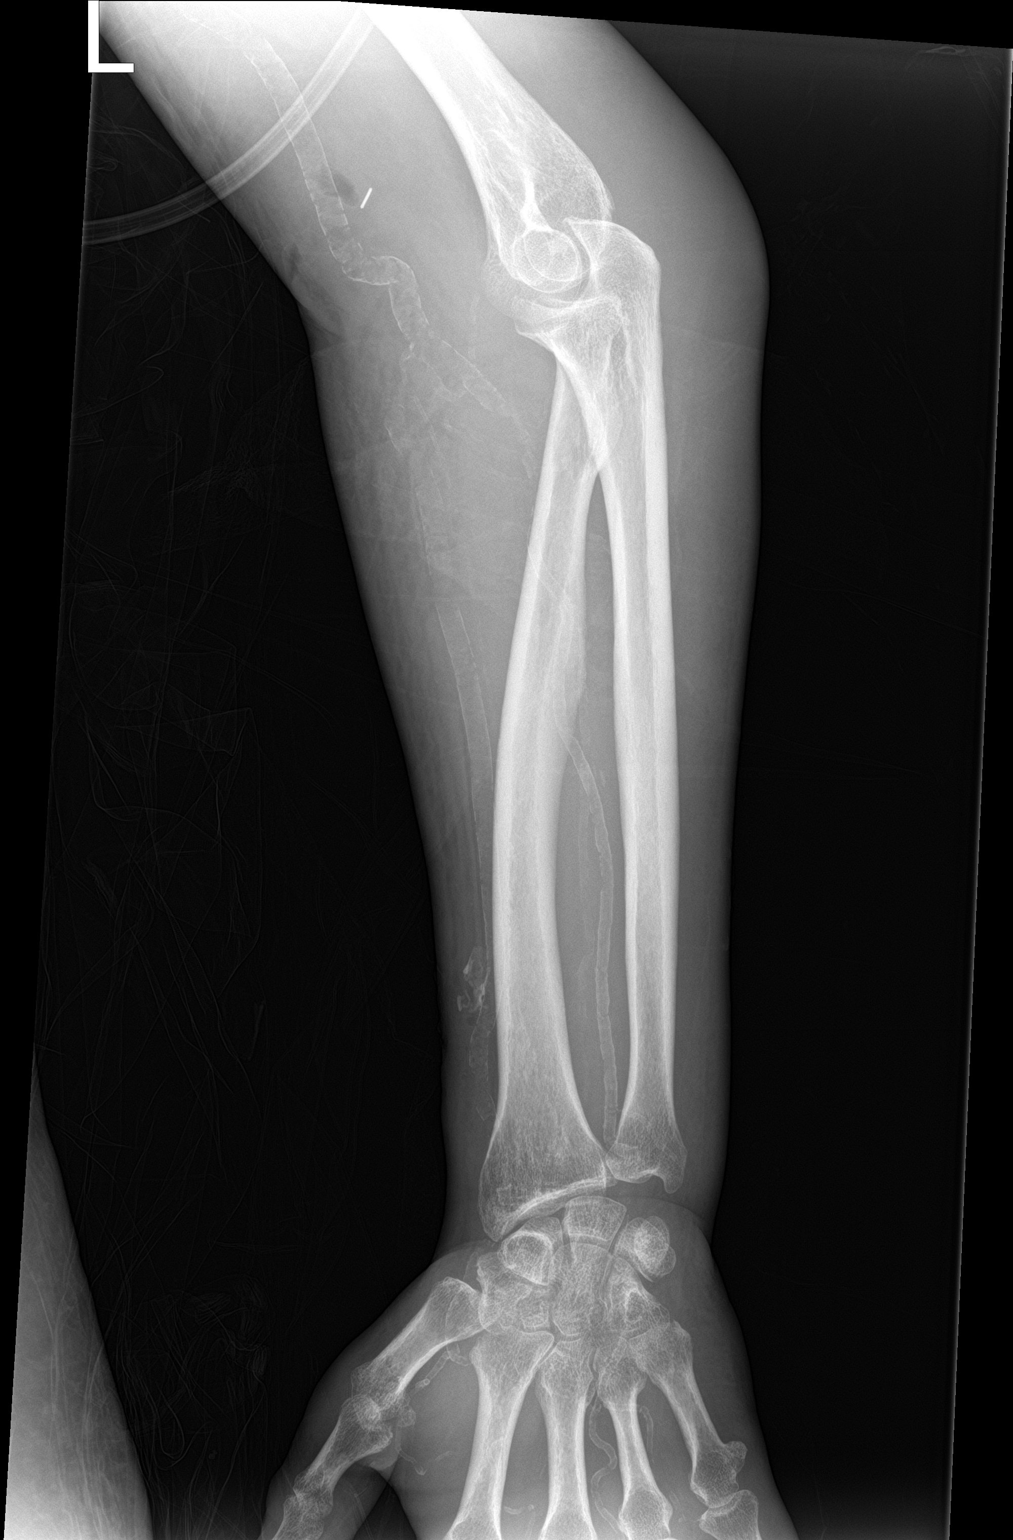

[forearm lat]
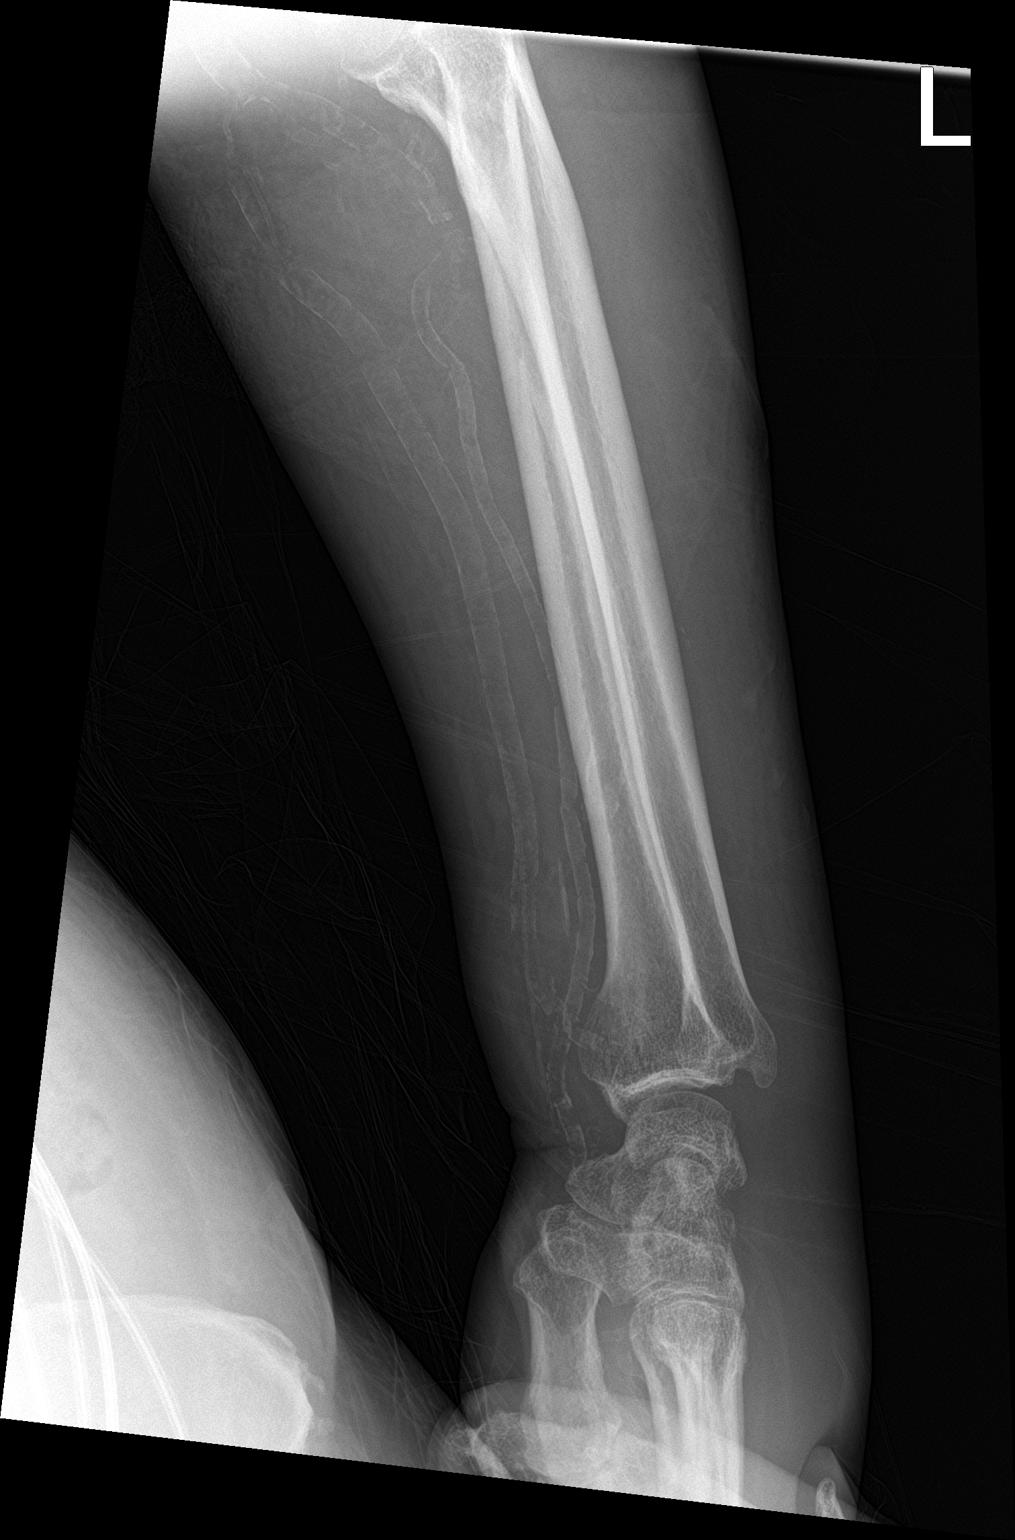

[2 of 2 positions shown; findings below may reference images not displayed]

FINDINGS: Frontal and lateral views were obtained. No acute fracture or
dislocation. No appreciable joint space narrowing or erosion.
Multiple foci of vascular calcification noted. Soft tissue air is
noted volar to the distal upper extremity.
IMPRESSION: Foci of soft tissue air may be have infectious etiology or may be
secondary to recent surgery. Clinical assessment in this regard
warranted.

No acute fracture or dislocation. No appreciable joint space
narrowing or erosion. Multiple foci of arterial vascular
calcification noted.

## 2021-09-17 IMAGING — CT CT CERVICAL SPINE W/O CM
2 series · 11 of 27 positions shown, 14 images · non-contrast
Comparison: None.

CLINICAL DATA: Fall

EXAM:
CT HEAD WITHOUT CONTRAST
CT CERVICAL SPINE WITHOUT CONTRAST
TECHNIQUE: Multidetector CT imaging of the head and cervical spine was
performed following the standard protocol without intravenous
contrast. Multiplanar CT image reconstructions of the cervical spine
were also generated.

[Series 8: sag bone · sagittal · 0.22mm/px · 5 of 62 slices shown, 6 images]
[im 21/62  bone]
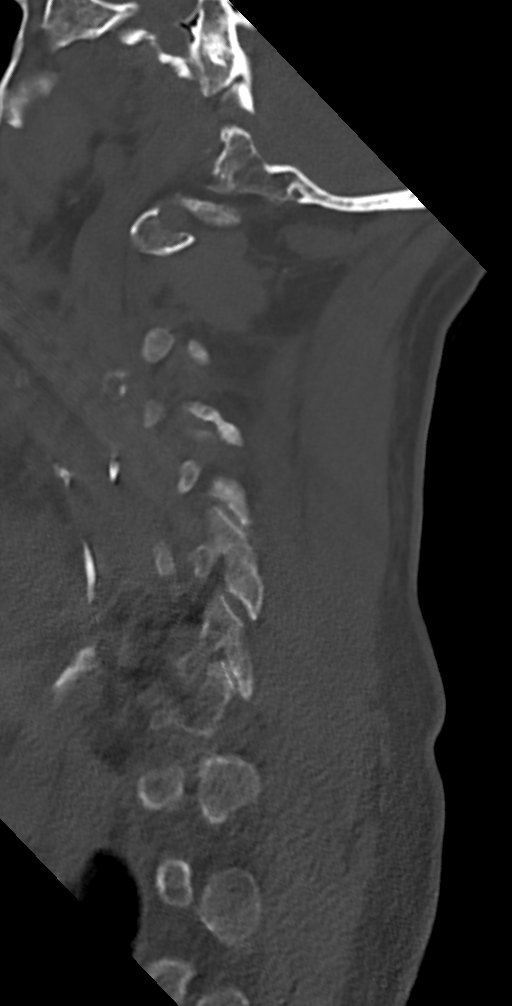
[im 26/62  bone]
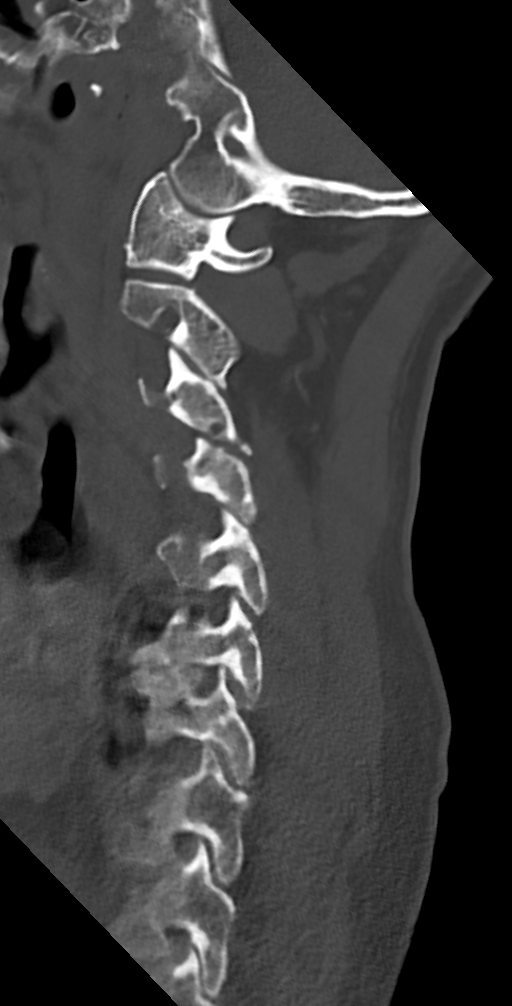
[im 31/62  soft-tissue]
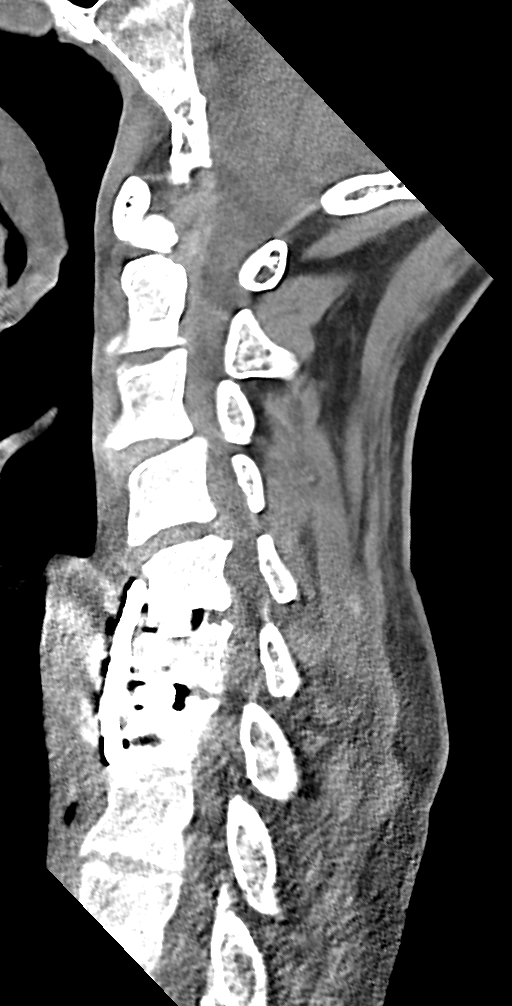
[im 31/62  bone]
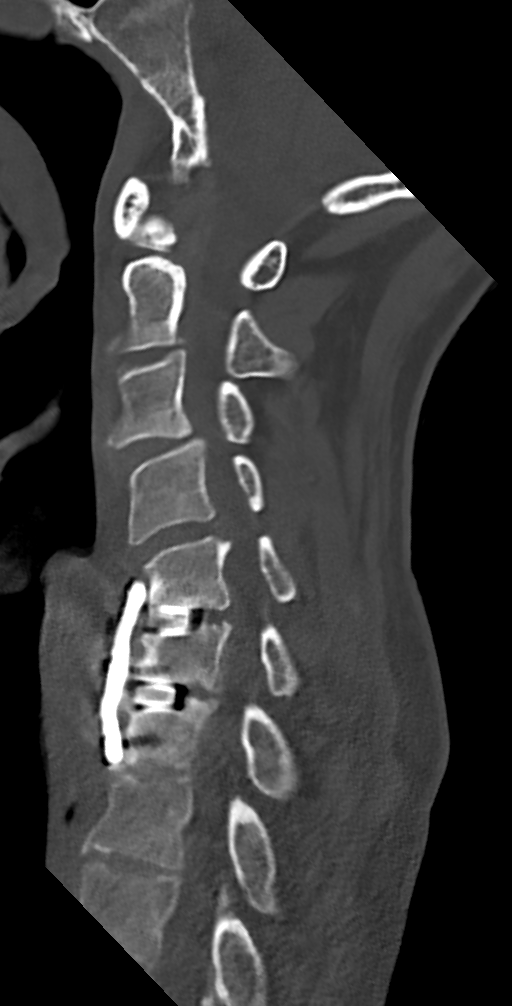
[im 36/62  bone]
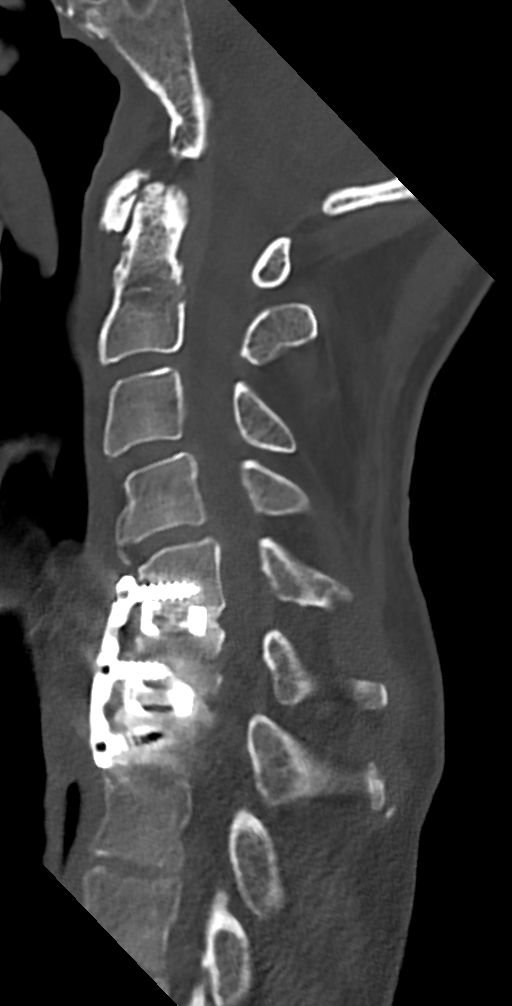
[im 41/62  bone]
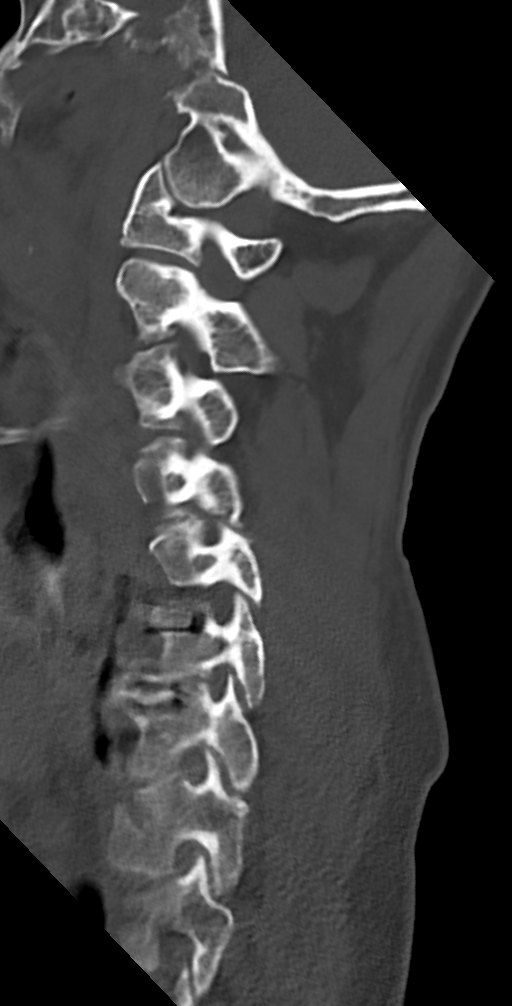

[Series 10: orthogonal axials · axial · 0.21mm/px · z∈[-261,-176]mm · 6 of 97 slices shown, 8 images]
[im 15/97  soft-tissue]
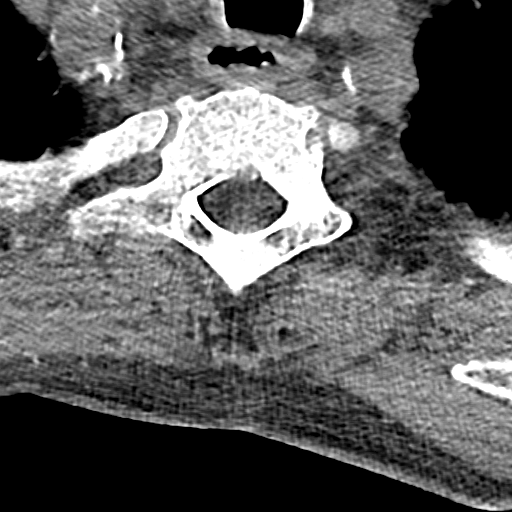
[im 15/97  bone]
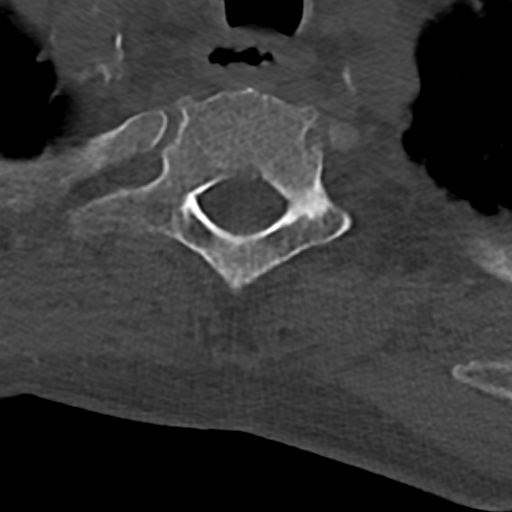
[im 30/97  bone]
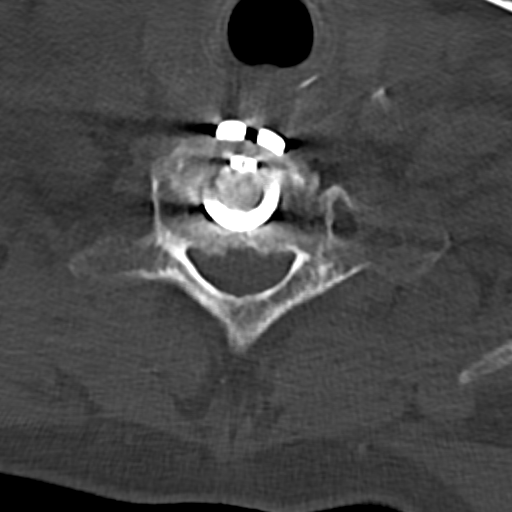
[im 45/97  bone]
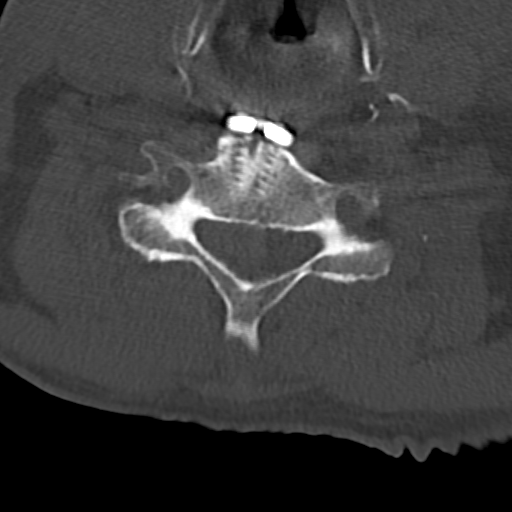
[im 60/97  bone]
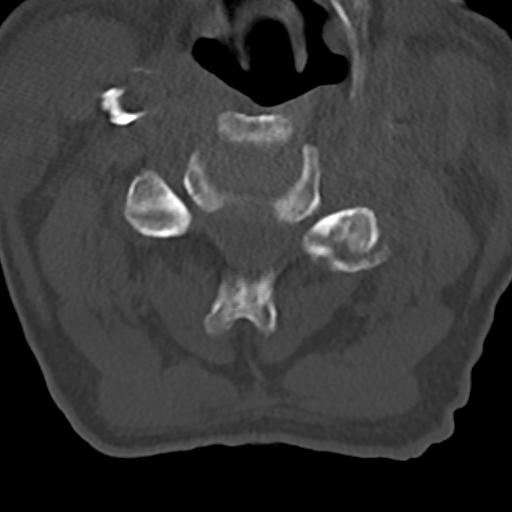
[im 74/97  soft-tissue]
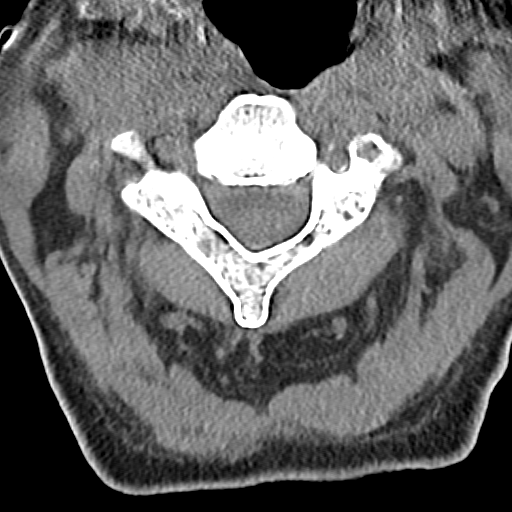
[im 74/97  bone]
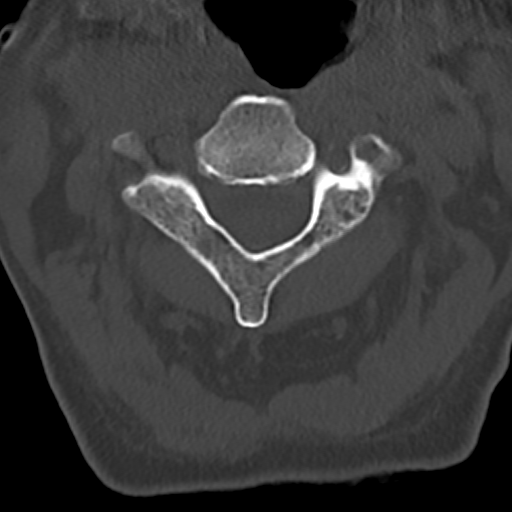
[im 89/97  bone]
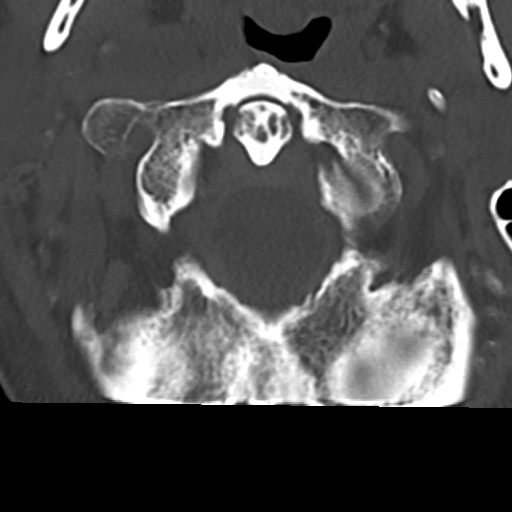

[11 of 27 positions shown; findings below may reference images not displayed]

FINDINGS: CT HEAD FINDINGS

Brain: No evidence of acute infarction, hemorrhage, hydrocephalus,
extra-axial collection or mass lesion/mass effect. Periventricular
white matter hypodensity.

Vascular: No hyperdense vessel or unexpected calcification.

Skull: Normal. Negative for fracture or focal lesion.

Sinuses/Orbits: No acute finding.

Other: None.

CT CERVICAL SPINE FINDINGS

Alignment: Degenerative straightening and reversal of the normal
cervical lordosis.

Skull base and vertebrae: No acute fracture. No primary bone lesion
or focal pathologic process.

Soft tissues and spinal canal: No prevertebral fluid or swelling. No
visible canal hematoma.

Disc levels: Status post anterior cervical discectomy and fusion of
C5 through C7.

Upper chest: Negative.

Other: None.
IMPRESSION: 1. No acute intracranial pathology. Small-vessel white matter
disease.
2. No fracture or static subluxation of the cervical spine.
3. Status post anterior cervical discectomy and fusion of C5 through
C7.

## 2021-09-18 IMAGING — DX DG CHEST 1V PORT
2 series · 2 of 2 positions shown · non-contrast
Comparison: 01/12/2021

CLINICAL DATA: Follow-up

EXAM:
PORTABLE CHEST - 1 VIEW

[chest ap (1 of 2)]
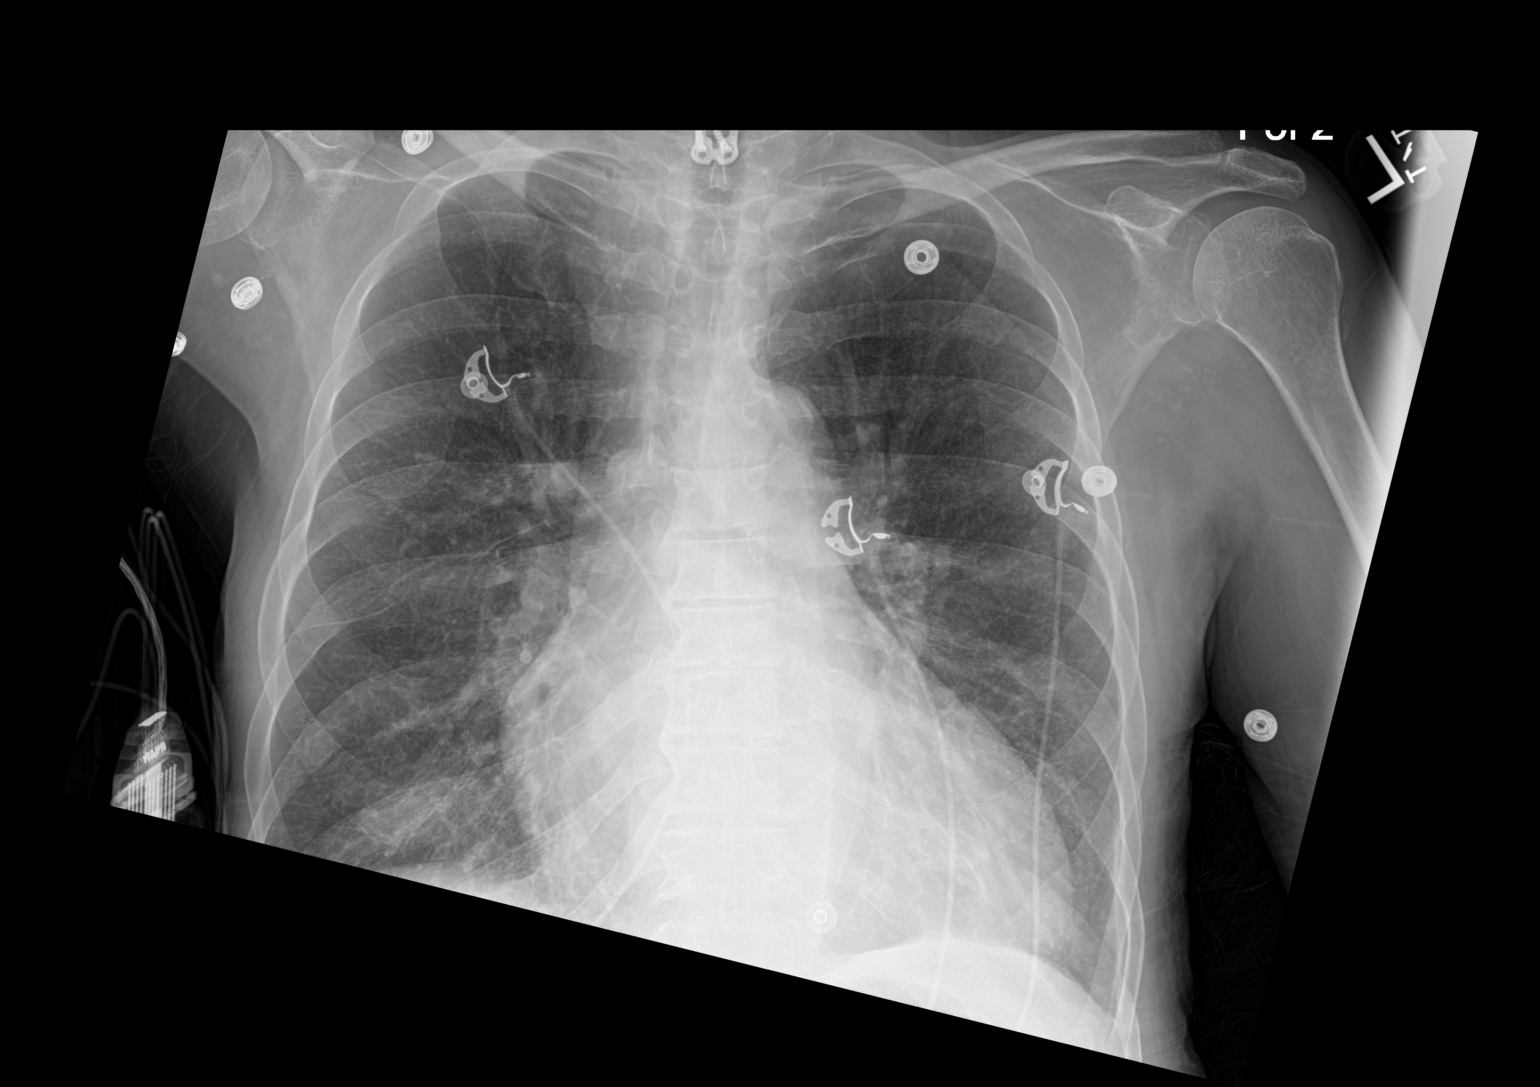

[chest ap (2 of 2)]
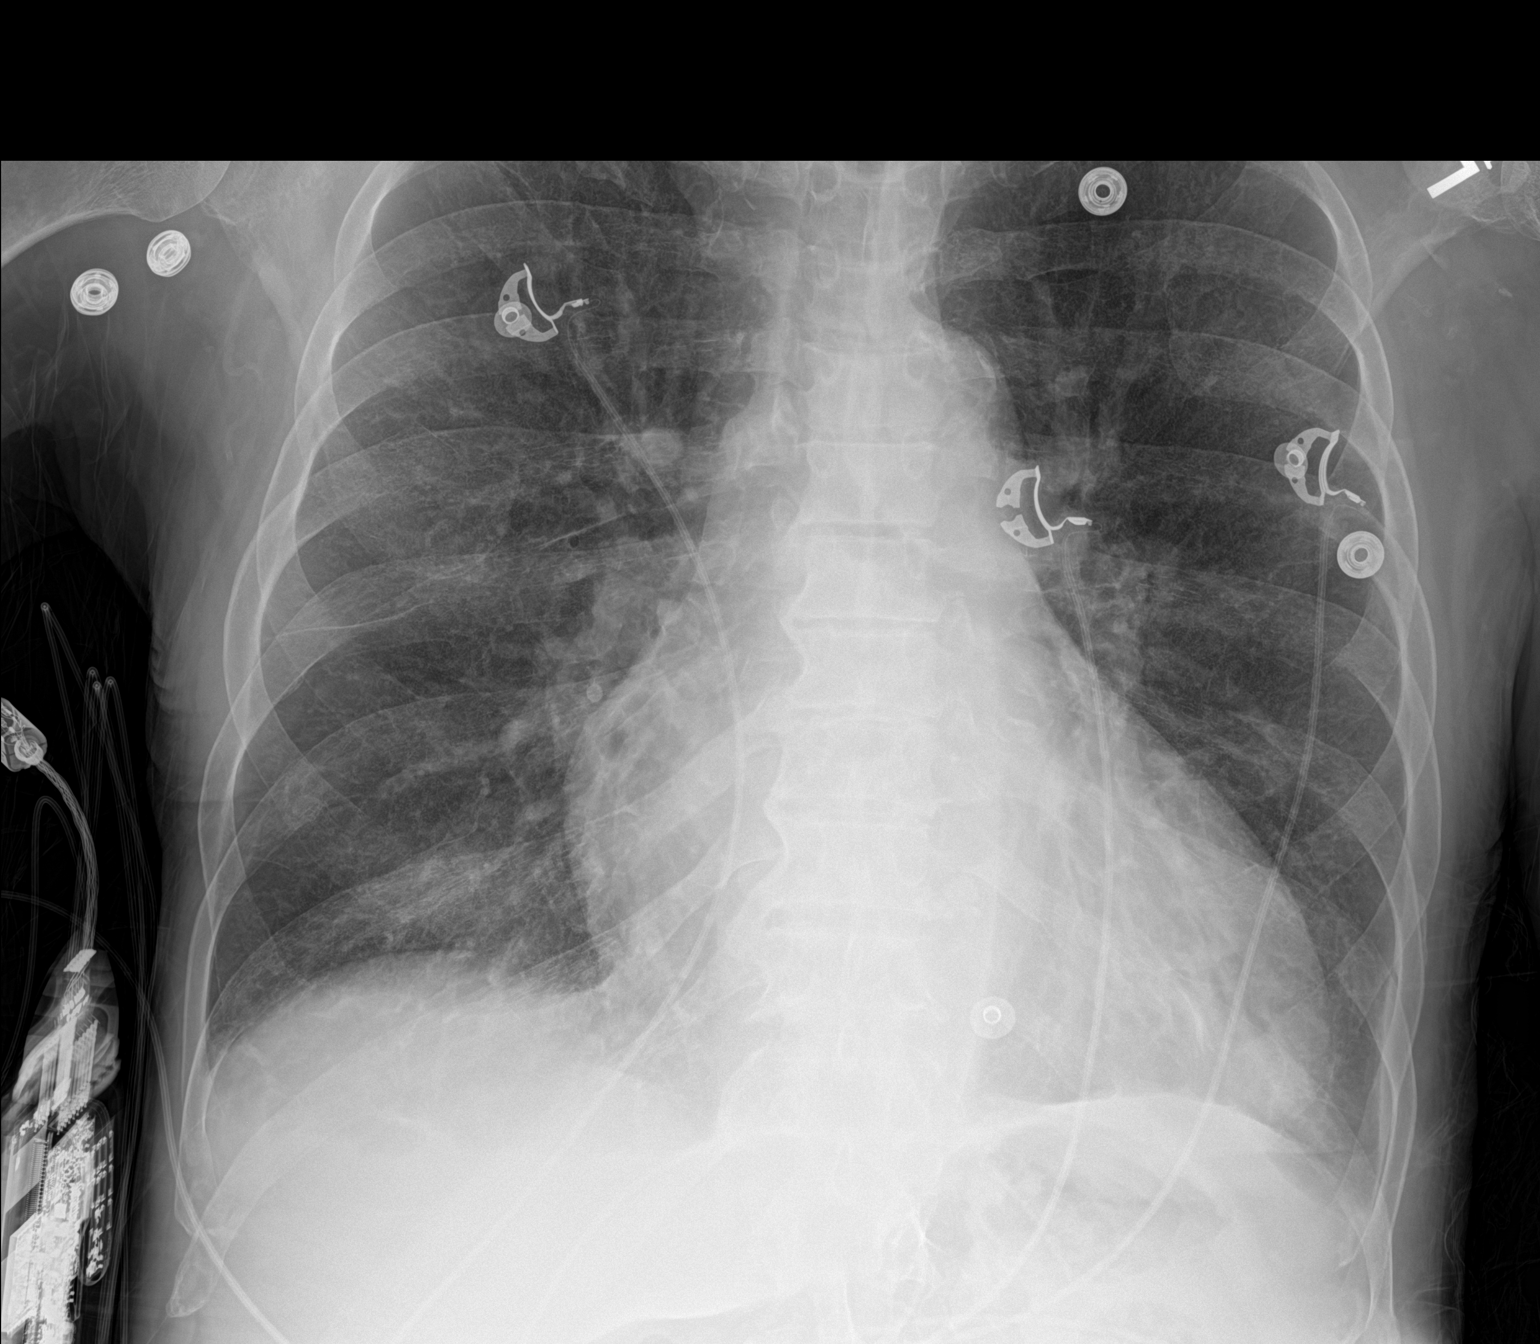

[2 of 2 positions shown; findings below may reference images not displayed]

FINDINGS: The mediastinal contours are within normal limits. Unchanged
moderate cardiomegaly. Improved aeration of the lungs bilaterally
with mild persistent scattered peribronchovascular patchy opacities.
No significant pleural effusion or pneumothorax. Atherosclerotic
calcification of the aortic arch. Multilevel cervicothoracic
anterior fusion hardware remains in place without complicating
feature. No acute osseous abnormality.
IMPRESSION: 1. Improved aeration bilaterally compatible with improving mild
pulmonary edema.
2. Stable moderate cardiomegaly.
3.  Aortic Atherosclerosis (FFDV1-797.7).

## 2021-09-19 ENCOUNTER — Inpatient Hospital Stay (HOSPITAL_COMMUNITY)
Admission: EM | Admit: 2021-09-19 | Discharge: 2021-09-21 | DRG: 640 | Disposition: A | Discharge status: Home / Self Care | Payer: Medicare Other | Attending: Internal Medicine | Admitting: Internal Medicine

## 2021-09-19 ENCOUNTER — Emergency Department (HOSPITAL_COMMUNITY): Payer: Medicare Other

## 2021-09-19 DIAGNOSIS — R0602 Shortness of breath: Secondary | ICD-10-CM

## 2021-09-19 DIAGNOSIS — Z86711 Personal history of pulmonary embolism: Secondary | ICD-10-CM

## 2021-09-19 DIAGNOSIS — Z20822 Contact with and (suspected) exposure to covid-19: Secondary | ICD-10-CM | POA: Diagnosis present

## 2021-09-19 DIAGNOSIS — Z992 Dependence on renal dialysis: Secondary | ICD-10-CM

## 2021-09-19 DIAGNOSIS — G2581 Restless legs syndrome: Secondary | ICD-10-CM | POA: Diagnosis present

## 2021-09-19 DIAGNOSIS — R0789 Other chest pain: Secondary | ICD-10-CM | POA: Diagnosis not present

## 2021-09-19 DIAGNOSIS — N186 End stage renal disease: Secondary | ICD-10-CM | POA: Diagnosis present

## 2021-09-19 DIAGNOSIS — E8770 Fluid overload, unspecified: Secondary | ICD-10-CM | POA: Diagnosis not present

## 2021-09-19 DIAGNOSIS — Z9115 Patient's noncompliance with renal dialysis: Secondary | ICD-10-CM

## 2021-09-19 DIAGNOSIS — I272 Pulmonary hypertension, unspecified: Secondary | ICD-10-CM | POA: Diagnosis present

## 2021-09-19 DIAGNOSIS — I5032 Chronic diastolic (congestive) heart failure: Secondary | ICD-10-CM | POA: Diagnosis present

## 2021-09-19 DIAGNOSIS — Z79899 Other long term (current) drug therapy: Secondary | ICD-10-CM

## 2021-09-19 DIAGNOSIS — Z7951 Long term (current) use of inhaled steroids: Secondary | ICD-10-CM

## 2021-09-19 DIAGNOSIS — Z91158 Patient's noncompliance with renal dialysis for other reason: Secondary | ICD-10-CM

## 2021-09-19 DIAGNOSIS — R54 Age-related physical debility: Secondary | ICD-10-CM | POA: Diagnosis present

## 2021-09-19 DIAGNOSIS — Z681 Body mass index (BMI) 19 or less, adult: Secondary | ICD-10-CM

## 2021-09-19 DIAGNOSIS — I4819 Other persistent atrial fibrillation: Secondary | ICD-10-CM | POA: Diagnosis present

## 2021-09-19 DIAGNOSIS — R9431 Abnormal electrocardiogram [ECG] [EKG]: Secondary | ICD-10-CM | POA: Diagnosis present

## 2021-09-19 DIAGNOSIS — D631 Anemia in chronic kidney disease: Secondary | ICD-10-CM | POA: Diagnosis present

## 2021-09-19 DIAGNOSIS — J9601 Acute respiratory failure with hypoxia: Secondary | ICD-10-CM | POA: Diagnosis not present

## 2021-09-19 DIAGNOSIS — I132 Hypertensive heart and chronic kidney disease with heart failure and with stage 5 chronic kidney disease, or end stage renal disease: Secondary | ICD-10-CM | POA: Diagnosis present

## 2021-09-19 DIAGNOSIS — N2581 Secondary hyperparathyroidism of renal origin: Secondary | ICD-10-CM | POA: Diagnosis present

## 2021-09-19 DIAGNOSIS — J449 Chronic obstructive pulmonary disease, unspecified: Secondary | ICD-10-CM | POA: Diagnosis present

## 2021-09-19 DIAGNOSIS — K219 Gastro-esophageal reflux disease without esophagitis: Secondary | ICD-10-CM | POA: Diagnosis present

## 2021-09-19 DIAGNOSIS — Z7901 Long term (current) use of anticoagulants: Secondary | ICD-10-CM

## 2021-09-19 DIAGNOSIS — E43 Unspecified severe protein-calorie malnutrition: Secondary | ICD-10-CM | POA: Diagnosis present

## 2021-09-19 DIAGNOSIS — E785 Hyperlipidemia, unspecified: Secondary | ICD-10-CM | POA: Diagnosis present

## 2021-09-19 DIAGNOSIS — R64 Cachexia: Secondary | ICD-10-CM | POA: Diagnosis present

## 2021-09-19 DIAGNOSIS — I10 Essential (primary) hypertension: Secondary | ICD-10-CM | POA: Diagnosis present

## 2021-09-19 DIAGNOSIS — Z981 Arthrodesis status: Secondary | ICD-10-CM

## 2021-09-19 DIAGNOSIS — Z8673 Personal history of transient ischemic attack (TIA), and cerebral infarction without residual deficits: Secondary | ICD-10-CM

## 2021-09-19 DIAGNOSIS — J90 Pleural effusion, not elsewhere classified: Secondary | ICD-10-CM | POA: Diagnosis present

## 2021-09-19 DIAGNOSIS — J9621 Acute and chronic respiratory failure with hypoxia: Secondary | ICD-10-CM | POA: Diagnosis present

## 2021-09-19 DIAGNOSIS — I2699 Other pulmonary embolism without acute cor pulmonale: Secondary | ICD-10-CM | POA: Diagnosis present

## 2021-09-19 LAB — COMPREHENSIVE METABOLIC PANEL
ALT: 10 U/L (ref 0–44)
AST: 19 U/L (ref 15–41)
Albumin: 2.8 g/dL — ABNORMAL LOW (ref 3.5–5.0)
Alkaline Phosphatase: 120 U/L (ref 38–126)
Anion gap: 20 — ABNORMAL HIGH (ref 5–15)
BUN: 27 mg/dL — ABNORMAL HIGH (ref 8–23)
CO2: 26 mmol/L (ref 22–32)
Calcium: 9.2 mg/dL (ref 8.9–10.3)
Chloride: 96 mmol/L — ABNORMAL LOW (ref 98–111)
Creatinine, Ser: 4.95 mg/dL — ABNORMAL HIGH (ref 0.61–1.24)
GFR, Estimated: 12 mL/min — ABNORMAL LOW (ref >60)
Glucose, Bld: 107 mg/dL — ABNORMAL HIGH (ref 70–99)
Potassium: 4.9 mmol/L (ref 3.5–5.1)
Sodium: 142 mmol/L (ref 135–145)
Total Bilirubin: 1 mg/dL (ref 0.3–1.2)
Total Protein: 6.8 g/dL (ref 6.5–8.1)

## 2021-09-19 LAB — HEPATITIS B SURFACE ANTIBODY,QUALITATIVE: Hep B S Ab: NONREACTIVE

## 2021-09-19 LAB — CBC WITH DIFFERENTIAL/PLATELET
Abs Immature Granulocytes: 0.02 10*3/uL (ref 0.00–0.07)
Basophils Absolute: 0.1 10*3/uL (ref 0.0–0.1)
Basophils Relative: 2 %
Eosinophils Absolute: 0.3 10*3/uL (ref 0.0–0.5)
Eosinophils Relative: 5 %
HCT: 34.8 % — ABNORMAL LOW (ref 39.0–52.0)
Hemoglobin: 10.4 g/dL — ABNORMAL LOW (ref 13.0–17.0)
Immature Granulocytes: 0 %
Lymphocytes Relative: 11 %
Lymphs Abs: 0.7 10*3/uL (ref 0.7–4.0)
MCH: 31.1 pg (ref 26.0–34.0)
MCHC: 29.9 g/dL — ABNORMAL LOW (ref 30.0–36.0)
MCV: 104.2 fL — ABNORMAL HIGH (ref 80.0–100.0)
Monocytes Absolute: 0.4 10*3/uL (ref 0.1–1.0)
Monocytes Relative: 6 %
Neutro Abs: 4.7 10*3/uL (ref 1.7–7.7)
Neutrophils Relative %: 76 %
Platelets: 215 10*3/uL (ref 150–400)
RBC: 3.34 MIL/uL — ABNORMAL LOW (ref 4.22–5.81)
RDW: 16.3 % — ABNORMAL HIGH (ref 11.5–15.5)
WBC: 6.1 10*3/uL (ref 4.0–10.5)
nRBC: 0 % (ref 0.0–0.2)

## 2021-09-19 LAB — I-STAT VENOUS BLOOD GAS, ED
Acid-Base Excess: 9 mmol/L — ABNORMAL HIGH (ref 0.0–2.0)
Bicarbonate: 32.1 mmol/L — ABNORMAL HIGH (ref 20.0–28.0)
Calcium, Ion: 0.96 mmol/L — ABNORMAL LOW (ref 1.15–1.40)
HCT: 33 % — ABNORMAL LOW (ref 39.0–52.0)
Hemoglobin: 11.2 g/dL — ABNORMAL LOW (ref 13.0–17.0)
O2 Saturation: 95 %
Potassium: 5.5 mmol/L — ABNORMAL HIGH (ref 3.5–5.1)
Sodium: 137 mmol/L (ref 135–145)
TCO2: 33 mmol/L — ABNORMAL HIGH (ref 22–32)
pCO2, Ven: 35.7 mmHg — ABNORMAL LOW (ref 44.0–60.0)
pH, Ven: 7.561 — ABNORMAL HIGH (ref 7.250–7.430)
pO2, Ven: 65 mmHg — ABNORMAL HIGH (ref 32.0–45.0)

## 2021-09-19 LAB — RENAL FUNCTION PANEL
Albumin: 2.9 g/dL — ABNORMAL LOW (ref 3.5–5.0)
Anion gap: 15 (ref 5–15)
BUN: 28 mg/dL — ABNORMAL HIGH (ref 8–23)
CO2: 26 mmol/L (ref 22–32)
Calcium: 8.7 mg/dL — ABNORMAL LOW (ref 8.9–10.3)
Chloride: 98 mmol/L (ref 98–111)
Creatinine, Ser: 5.02 mg/dL — ABNORMAL HIGH (ref 0.61–1.24)
GFR, Estimated: 12 mL/min — ABNORMAL LOW (ref >60)
Glucose, Bld: 90 mg/dL (ref 70–99)
Phosphorus: 3.9 mg/dL (ref 2.5–4.6)
Potassium: 4.9 mmol/L (ref 3.5–5.1)
Sodium: 139 mmol/L (ref 135–145)

## 2021-09-19 LAB — RESP PANEL BY RT-PCR (FLU A&B, COVID) ARPGX2
Influenza A by PCR: NEGATIVE
Influenza B by PCR: NEGATIVE
SARS Coronavirus 2 by RT PCR: NEGATIVE

## 2021-09-19 LAB — TROPONIN I (HIGH SENSITIVITY)
Troponin I (High Sensitivity): 47 ng/L — ABNORMAL HIGH (ref <18)
Troponin I (High Sensitivity): 50 ng/L — ABNORMAL HIGH (ref <18)
Troponin I (High Sensitivity): 46 ng/L — ABNORMAL HIGH (ref <18)

## 2021-09-19 LAB — LACTIC ACID, PLASMA
Lactic Acid, Venous: 1.4 mmol/L (ref 0.5–1.9)
Lactic Acid, Venous: 1.5 mmol/L (ref 0.5–1.9)

## 2021-09-19 LAB — BRAIN NATRIURETIC PEPTIDE: B Natriuretic Peptide: >4500 pg/mL — ABNORMAL HIGH (ref 0.0–100.0)

## 2021-09-19 LAB — HEPATITIS B SURFACE ANTIGEN: Hepatitis B Surface Ag: NONREACTIVE

## 2021-09-19 MED ORDER — OXYCODONE-ACETAMINOPHEN 5-325 MG PO TABS
1.0000 | ORAL_TABLET | Freq: Once | ORAL | Status: AC | PRN
  Administered 2021-09-19: 1 via ORAL
  Filled 2021-09-19: qty 1

## 2021-09-19 MED ORDER — RENA-VITE PO TABS
1.0000 | ORAL_TABLET | Freq: Every day | ORAL | Status: DC
  Administered 2021-09-19 – 2021-09-20 (×2): 1 via ORAL
  Filled 2021-09-19 (×3): qty 1

## 2021-09-19 MED ORDER — LACTATED RINGERS IV SOLN: INTRAVENOUS | Status: DC

## 2021-09-19 MED ORDER — LIDOCAINE HCL (PF) 1 % IJ SOLN
5.0000 mL | INTRAMUSCULAR | Status: DC | PRN
  Filled 2021-09-19: qty 5

## 2021-09-19 MED ORDER — CHLORHEXIDINE GLUCONATE CLOTH 2 % EX PADS: 6.0000 | MEDICATED_PAD | Freq: Every day | CUTANEOUS | Status: DC

## 2021-09-19 MED ORDER — ALTEPLASE 2 MG IJ SOLR: 2.0000 mg | Freq: Once | INTRAMUSCULAR | Status: DC | PRN

## 2021-09-19 MED ORDER — LIDOCAINE-PRILOCAINE 2.5-2.5 % EX CREA
1.0000 "application " | TOPICAL_CREAM | CUTANEOUS | Status: DC | PRN
  Filled 2021-09-19: qty 5

## 2021-09-19 MED ORDER — HEPARIN SODIUM (PORCINE) 1000 UNIT/ML DIALYSIS
20.0000 [IU]/kg | INTRAMUSCULAR | Status: DC | PRN
  Filled 2021-09-19 (×2): qty 1

## 2021-09-19 MED ORDER — HEPARIN SODIUM (PORCINE) 1000 UNIT/ML DIALYSIS
1000.0000 [IU] | INTRAMUSCULAR | Status: DC | PRN
  Filled 2021-09-19 (×3): qty 1

## 2021-09-19 MED ORDER — SODIUM CHLORIDE 0.9% FLUSH
3.0000 mL | Freq: Two times a day (BID) | INTRAVENOUS | Status: DC
  Administered 2021-09-20 (×2): 3 mL via INTRAVENOUS

## 2021-09-19 MED ORDER — DILTIAZEM HCL ER COATED BEADS 240 MG PO CP24
240.0000 mg | ORAL_CAPSULE | Freq: Every day | ORAL | Status: DC
  Administered 2021-09-20: 240 mg via ORAL
  Filled 2021-09-19: qty 1

## 2021-09-19 MED ORDER — SODIUM CHLORIDE 0.9 % IV SOLN: 100.0000 mL | INTRAVENOUS | Status: DC | PRN

## 2021-09-19 MED ORDER — SODIUM CHLORIDE 0.9% FLUSH: 3.0000 mL | INTRAVENOUS | Status: DC | PRN

## 2021-09-19 MED ORDER — ACETAMINOPHEN 650 MG RE SUPP: 650.0000 mg | Freq: Four times a day (QID) | RECTAL | Status: DC | PRN

## 2021-09-19 MED ORDER — ACETAMINOPHEN 325 MG PO TABS
650.0000 mg | ORAL_TABLET | Freq: Four times a day (QID) | ORAL | Status: DC | PRN
  Administered 2021-09-19: 650 mg via ORAL
  Filled 2021-09-19: qty 2

## 2021-09-19 MED ORDER — SODIUM CHLORIDE 0.9 % IV SOLN: 250.0000 mL | INTRAVENOUS | Status: DC | PRN

## 2021-09-19 MED ORDER — MIDODRINE HCL 5 MG PO TABS
10.0000 mg | ORAL_TABLET | Freq: Three times a day (TID) | ORAL | Status: DC
  Administered 2021-09-20 – 2021-09-21 (×4): 10 mg via ORAL
  Filled 2021-09-19 (×5): qty 2

## 2021-09-19 MED ORDER — PENTAFLUOROPROP-TETRAFLUOROETH EX AERO
1.0000 "application " | INHALATION_SPRAY | CUTANEOUS | Status: DC | PRN
  Filled 2021-09-19: qty 116

## 2021-09-19 MED ORDER — APIXABAN 2.5 MG PO TABS
2.5000 mg | ORAL_TABLET | Freq: Two times a day (BID) | ORAL | Status: DC
  Administered 2021-09-19 – 2021-09-20 (×3): 2.5 mg via ORAL
  Filled 2021-09-19 (×3): qty 1

## 2021-09-19 MED ORDER — ALBUTEROL SULFATE (2.5 MG/3ML) 0.083% IN NEBU: 2.5000 mg | INHALATION_SOLUTION | Freq: Two times a day (BID) | RESPIRATORY_TRACT | Status: DC | PRN

## 2021-09-19 MED ORDER — FENTANYL CITRATE PF 50 MCG/ML IJ SOSY
50.0000 ug | PREFILLED_SYRINGE | Freq: Once | INTRAMUSCULAR | Status: AC
  Administered 2021-09-19: 50 ug via INTRAVENOUS
  Filled 2021-09-19: qty 1

## 2021-09-19 MED ORDER — FLUTICASONE FUROATE-VILANTEROL 100-25 MCG/ACT IN AEPB
1.0000 | INHALATION_SPRAY | Freq: Every day | RESPIRATORY_TRACT | Status: DC
  Administered 2021-09-19: 1 via RESPIRATORY_TRACT
  Filled 2021-09-19: qty 28

## 2021-09-19 NOTE — Assessment & Plan Note (Signed)
BP stable-on midodrine-reviewed prior notes during his most recent hospitalization-BP has been soft.  Suspect patient is on Cardizem for rate control.  We will continue both of these agents and follow closely.

## 2021-09-19 NOTE — Progress Notes (Addendum)
[facility] NEPHROLOGY PROGRESS NOTE  Assessment/ Plan: Pt is a 61 y.o. yo male with history of hypertension, chronic diastolic heart failure, respiratory failure, pulmonary edema, A. fib, ESRD on HD nonadherence with dialysis treatment with limited ultrafiltration, recently discharged from the hospital on 1/13 presents with chest pain and shortness of breath.  We are consulted to manage ESRD.  OP HD:  SW MWF 3.5h  400/500  EDW: 50.5 kg  2/2 bath  RIJ TDC/  L AVF unusable Mircera 150 mcg on 1/18 Venofer 100  mg on 1/18  #Acute on chronic hypoxic respiratory failure/chronic pulmonary edema: Patient does not have peripheral edema however chest x-ray with chronic pleural effusion.  Oxygen requirement is high currently.  He is not adherent with dialysis treatment leading to limited ultrafiltration each time.  We will plan to do dialysis tonight or tomorrow for ultrafiltration.  # ESRD MWF: Last HD on 1/18 for about 2 hours only.  Given worsening hypoxia we will plan to do dialysis tonight or tomorrow depending on the schedule.  Patient has chronic history of nonadherence with dialysis treatment.  He lost significant amount of weight and looks cachectic.  I recommend palliative care consult.  He has [facility] for the access.  #Acute on chronic diastolic heart failure: Significantly elevated BNP and chest x-ray consistent with fluid overload.  Ultrafiltration during HD however ultrafiltration has been limited by patient's incomplete treatment.  # Anemia of CKD/ESRD: Received Mircera yesterday.  Monitor hemoglobin.  # Secondary hyperparathyroidism: Monitor calcium phosphorus level.  # HTN/volume: Ultrafiltration during HD as discussed above.  #Severe protein calorie malnutrition  Discussed with the primary team.  Subjective: Seen and examined in ER.  Patient complaining of shortness of breath however chest pain improved after coming to ER.  No nausea, vomiting headache or  dizziness. Objective Vital signs in last 24 hours: Vitals:   09/19/21 1341 09/19/21 1415 09/19/21 1545 09/19/21 1600  BP:  122/78 (!) 134/92 (!) 139/101  Pulse:  (!) 120 68 (!) 116  Resp:  19 (!) 23 (!) 28  Temp:      TempSrc:      SpO2: 92% 91% 98% 94%   Weight change:  No intake or output data in the 24 hours ending 09/19/21 1642     Labs: Basic Metabolic Panel: Recent Labs  Lab 09/13/21 0750 09/19/21 1443 09/19/21 1509  NA 136 142 137  K 4.7 4.9 5.5*  CL 97* 96*  --   CO2 26 26  --   GLUCOSE 88 107*  --   BUN 38* 27*  --   CREATININE 5.14* 4.95*  --   CALCIUM 10.3 9.2  --   PHOS 5.4*  --   --    Liver Function Tests: Recent Labs  Lab 09/13/21 0750 09/19/21 1443  AST  --  19  ALT  --  10  ALKPHOS  --  120  BILITOT  --  1.0  PROT  --  6.8  ALBUMIN 2.6* 2.8*   No results for input(s): LIPASE, AMYLASE in the last 168 hours. No results for input(s): AMMONIA in the last 168 hours. CBC: Recent Labs  Lab 09/19/21 1443 09/19/21 1509  WBC 6.1  --   NEUTROABS 4.7  --   HGB 10.4* 11.2*  HCT 34.8* 33.0*  MCV 104.2*  --   PLT 215  --    Cardiac Enzymes: No results for input(s): CKTOTAL, CKMB, CKMBINDEX, TROPONINI in the last 168 hours. CBG: No results for  input(s): GLUCAP in the last 168 hours.  Iron Studies: No results for input(s): IRON, TIBC, TRANSFERRIN, FERRITIN in the last 72 hours. Studies/Results: DG Chest Port 1 View  Result Date: 09/19/2021 CLINICAL DATA:  Concern for sepsis EXAM: PORTABLE CHEST 1 VIEW COMPARISON:  Chest x-ray dated September 11, 2021 FINDINGS: Cardiac and mediastinal contours are unchanged. Stable position of right central venous line. Diffuse bilateral interstitial opacities. No focal consolidation. Small right-greater-than-left pleural effusions, right pleural effusion is increased in size. No evidence of pneumothorax. IMPRESSION: 1. Similar diffuse bilateral interstitial opacities, likely due to pulmonary edema. 2. Increased  small right pleural effusion. Electronically Signed   By: Yetta Glassman M.D.   On: 09/19/2021 14:22    Medications: Infusions:  lactated ringers      Scheduled Medications:  fentaNYL (SUBLIMAZE) injection  50 mcg Intravenous Once    have reviewed scheduled and prn medications.  Physical Exam: General: Cachectic male lying on bed with oxygen via nasal cannula Heart:RRR, s1s2 nl Lungs: No increased work of breathing, some rhonchi on bases.  Able to speak full sentence. Abdomen:soft, Non-tender, non-distended Extremities:No edema Dialysis Access: Right IJ TDC.  Yuleidy Rappleye Reesa Chew Orrie Schubert 09/19/2021,4:42 PM  LOS: 0 days

## 2021-09-19 NOTE — ED Triage Notes (Signed)
Pt BIB [facility] from home after calling for left sided chest pain. Upon arrival EMS observed pt O2 sat 90% on NC2.5L applied NRB at 8lpm and patient improved to 100%.EtCO2 26 throughout transport. Patient reports nausea, cough, SOB. No fever reported, vaccinated against covid. EMS reports rhonchi in lower lobes. Hx of afib, asthma, ESRD.

## 2021-09-19 NOTE — ED Provider Notes (Signed)
[facility] EMERGENCY DEPARTMENT Provider Note   CSN: [id number] Arrival date & time: 09/19/21  1317     History  Chief Complaint  Patient presents with   Shortness of Breath    Thomas Robinson is a 61 y.o. male.  Patient with history of end-stage renal disease on hemodialysis, last episode was yesterday, history of A. fib with RVR, chronic diastolic heart failure, admission 1/7 - 09/13/2021 for acute on chronic respiratory failure appearing to be multifactorial in the setting of volume overload/pulmonary edema/pleural effusion, small PE noted on CT imaging during admission currently on Eliquis --presents to the emergency department for evaluation of of shortness of breath.  Patient states that he returned home from dialysis yesterday and began to feel poorly.  He has had a cough with tightness in his chest.  He slept last night and was very weak this morning after waking up.  He reports nausea with frequent episodes of vomiting.  EMS was called for transport to the hospital.  O2 saturation was low on his chronic oxygen and he was increased to 8 L.  Patient currently on 4 L nasal cannula with oxygen saturations in the low 90s.  Patient denies any fevers.  Reports small amount of urine yesterday, first time in a week.  No lower extremity swelling.  He reports minor skin irritation over the sacrum without ulceration.      Home Medications Prior to Admission medications   Medication Sig Start Date End Date Taking? Authorizing Provider  albuterol (VENTOLIN HFA) 108 (90 Base) MCG/ACT inhaler Inhale 2 puffs into the lungs 2 (two) times daily as needed for shortness of breath or wheezing. 06/01/21   Oswald Hillock, MD  apixaban (ELIQUIS) 2.5 MG TABS tablet Take 1 tablet (2.5 mg total) by mouth 2 (two) times daily. 09/13/21 10/13/21  Elgergawy, Silver Huguenin, MD  cinacalcet (SENSIPAR) 60 MG tablet Take 1 tablet (60 mg total) by mouth every evening. Patient not taking: Reported on  07/16/2021 06/01/21   Oswald Hillock, MD  diltiazem (CARDIZEM CD) 240 MG 24 hr capsule Take 1 capsule (240 mg total) by mouth daily. 09/13/21   Elgergawy, Silver Huguenin, MD  feeding supplement (ENSURE ENLIVE / ENSURE PLUS) LIQD Take 237 mLs by mouth 2 (two) times daily between meals. 09/13/21   Elgergawy, Silver Huguenin, MD  fluticasone furoate-vilanterol (BREO ELLIPTA) 100-25 MCG/ACT AEPB Inhale 1 puff into the lungs daily.    [provider]  midodrine (PROAMATINE) 10 MG tablet Take 1 tablet (10 mg total) by mouth 3 (three) times daily with meals. 09/13/21   Elgergawy, Silver Huguenin, MD  multivitamin (RENA-VIT) TABS tablet Take 1 tablet by mouth at bedtime. 01/12/18   [provider]  sevelamer carbonate (RENVELA) 800 MG tablet Take 800 mg by mouth 3 (three) times daily with meals. 05/31/21   [provider]      Allergies    Vancomycin    Review of Systems   Review of Systems  Constitutional:  Positive for chills. Negative for fever.  HENT:  Negative for congestion, rhinorrhea and sore throat.   Eyes:  Negative for redness.  Respiratory:  Positive for cough and shortness of breath.   Cardiovascular:  Negative for chest pain.  Gastrointestinal:  Positive for nausea and vomiting. Negative for abdominal pain, blood in stool and diarrhea.  Genitourinary:  Negative for flank pain.  Musculoskeletal:  Negative for myalgias.  Skin:  Positive for color change. Negative for rash.  Neurological:  Negative for headaches.   Physical Exam Updated Vital Signs BP 130/81 (BP Location: Left Arm)    Pulse (!) 120    Temp 97.6 F (36.4 C) (Oral)    Resp (!) 30    SpO2 92%  Physical Exam Vitals and nursing note reviewed.  Constitutional:      General: He is not in acute distress.    Comments: Patient cachectic and underweight.  HENT:     Head: Normocephalic and atraumatic.  Eyes:     General:        Right eye: No discharge.        Left eye: No discharge.     Conjunctiva/sclera: Conjunctivae  normal.  Cardiovascular:     Rate and Rhythm: Regular rhythm. Tachycardia present.     Heart sounds: Normal heart sounds.  Pulmonary:     Effort: Pulmonary effort is normal.     Breath sounds: Decreased breath sounds present. No wheezing.  Abdominal:     Palpations: Abdomen is soft.     Tenderness: There is no abdominal tenderness.  Genitourinary:    Comments: Rectal temp performed by myself, small external hemorrhoid, nonthrombosed, nonbleeding noted.  No abscess. Musculoskeletal:     Cervical back: Normal range of motion and neck supple.  Skin:    General: Skin is warm and dry.     Comments: Erythema noted over the sacrum without ulceration or drainage.  Neurological:     Mental Status: He is alert.    ED Results / Procedures / Treatments   Labs (all labs ordered are listed, but only abnormal results are displayed) Labs Reviewed  CBC WITH DIFFERENTIAL/PLATELET - Abnormal; Notable for the following components:      Result Value   RBC 3.34 (*)    Hemoglobin 10.4 (*)    HCT 34.8 (*)    MCV 104.2 (*)    MCHC 29.9 (*)    RDW 16.3 (*)    All other components within normal limits  I-STAT VENOUS BLOOD GAS, ED - Abnormal; Notable for the following components:   pH, Ven 7.561 (*)    pCO2, Ven 35.7 (*)    pO2, Ven 65.0 (*)    Bicarbonate 32.1 (*)    TCO2 33 (*)    Acid-Base Excess 9.0 (*)    Potassium 5.5 (*)    Calcium, Ion 0.96 (*)    HCT 33.0 (*)    Hemoglobin 11.2 (*)    All other components within normal limits  RESP PANEL BY RT-PCR (FLU A&B, COVID) ARPGX2  CULTURE, BLOOD (ROUTINE X 2)  CULTURE, BLOOD (ROUTINE X 2)  LACTIC ACID, PLASMA  LACTIC ACID, PLASMA  COMPREHENSIVE METABOLIC PANEL  BRAIN NATRIURETIC PEPTIDE  PROTIME-INR  APTT  TROPONIN I (HIGH SENSITIVITY)  TROPONIN I (HIGH SENSITIVITY)    ED ECG REPORT   Date: 09/19/2021  Rate: 117  Rhythm: atrial flutter  QRS Axis: right  Intervals: QT prolonged  ST/T Wave abnormalities: nonspecific ST/T changes   Conduction Disutrbances:none  Narrative Interpretation:   Old EKG Reviewed: unchanged from 09/07/21  I have personally reviewed the EKG tracing and agree with the computerized printout as noted.   Radiology DG Chest Port 1 View  Result Date: 09/19/2021 CLINICAL DATA:  Concern for sepsis EXAM: PORTABLE CHEST 1 VIEW COMPARISON:  Chest x-ray dated September 11, 2021 FINDINGS: Cardiac and mediastinal contours are unchanged. Stable position of right central venous line. Diffuse bilateral interstitial opacities. No focal consolidation. Small right-greater-than-left pleural effusions, right pleural effusion  is increased in size. No evidence of pneumothorax. IMPRESSION: 1. Similar diffuse bilateral interstitial opacities, likely due to pulmonary edema. 2. Increased small right pleural effusion. Electronically Signed   By: Yetta Glassman M.D.   On: 09/19/2021 14:22    Procedures Procedures    Medications Ordered in ED Medications  lactated ringers infusion (has no administration in time range)    ED Course/ Medical Decision Making/ A&P   Patient seen and examined. History obtained directly from patient and from recent admission notes in epic. I performed rectal temp. External GU exam with RN chaperone at bedside.   Labs/EKG: EKG reviewed and interpreted.  Sepsis orders placed with troponin and BNP.  Imaging: Chest x-ray ordered  Medications/Fluids: None at this time.  Considered IV antibiotics which will be needed if source of infection is identified.  Most recent vital signs reviewed and are as follows: BP 130/81 (BP Location: Left Arm)    Pulse (!) 120    Temp 97.6 F (36.4 C) (Oral)    Resp (!) 30    SpO2 92%   Initial impression: acute on chronic resp failure.   3:36 PM I did speak with Dr. Carolin Sicks, nephrology.  They will consult on patient.  Chest x-ray personally reviewed and interpreted.  Agree with concern for edema.  No focal consolidation.  3:56 PM Work-up pending.  Signout to  Dr. Marguerite Olea. Kathrynn Humble at shift change.                            Medical Decision Making Amount and/or Complexity of Data Reviewed Labs: ordered. Radiology: ordered. ECG/medicine tests: ordered.  Risk Prescription drug management.   Pending completion of work-up.  Patient with suspected hypoxic respiratory failure in the setting of recurrent fluid overload and heart failure.           Final Clinical Impression(s) / ED Diagnoses Final diagnoses:  None    Rx / DC Orders ED Discharge Orders     None         Carlisle Cater, PA-C 09/19/21 [place] A, DO 09/19/21 1616

## 2021-09-19 NOTE — Assessment & Plan Note (Signed)
Reviewed most recent discharge summary-prior thoracocentesis consistent with a transudate.  I suspect this is due to noncompliance with hemodialysis.

## 2021-09-19 NOTE — Assessment & Plan Note (Addendum)
Heart rate slightly elevated at times-I have increased dosage of Cardizem-remains on Eliquis.  Follow-up with outpatient MD/cardiology for further optimization.

## 2021-09-19 NOTE — Assessment & Plan Note (Signed)
Will defer Aranesp/iron to nephrology service.

## 2021-09-19 NOTE — Assessment & Plan Note (Addendum)
Volume status much better-see above.

## 2021-09-19 NOTE — H&P (Signed)
HISTORY AND PHYSICAL       PATIENT DETAILS Name: Thomas Robinson Age: 61 y.o. Sex: male Date of Birth: 1961-06-15 Admit Date: 09/19/2021 EXH:[id number], [place], [place]   Patient coming from: [place]:  Shortness of breath  HPI: Thomas Robinson is a 61 y.o. male with medical history significant of ESRD on HD-noncompliance to hemodialysis treatment-numerous hospitalizations for shortness of breath due to pulmonary edema in the setting of missed dialysis-presented to the ED for the above noted complaints.  Per patient-he was just discharged from this hospital on 1/13-he claims he has been compliant with outpatient HD since discharge-claims his last dialysis was yesterday.  He claims that since 1-2 days he has been feeling poorly-yesterday evening he started having worsening shortness of breath and left-sided chest pain.  Chest pain is not related to exertion-and is sharp at times.  There is no radiation of the pain.  There is no associated nausea, vomiting or diaphoresis.  He was brought to the ED by EMS-and he was found to be hypoxic requiring 8 L of oxygen (titrated down to 4 L.).  Chest x-ray showed pulm edema-nephrology was consulted-TRH was asked to admit this patient for further evaluation and treatment.  Patient denies any fever, headache, nausea, vomiting or diarrhea.  He denies any URI-like symptoms.  ED Course: Found to have pulm edema-requiring around 8 L of oxygen-nephrology consulted-hospitalist consulted for admission.   REVIEW OF SYSTEMS:  Constitutional:   No  weight loss, night sweats,  Fevers, chills, fatigue.  HEENT:    No headaches, Dysphagia,Tooth/dental problems,Sore throat,  No sneezing, itching, ear ache, nasal congestion, post nasal drip  Cardio-vascular: No  palpitations  GI:  No heartburn, indigestion, abdominal pain, nausea, vomiting, diarrhea, melena or hematochezia  Resp: No hemoptysis,plueritic chest pain.   Skin:  No  rash or lesions.  GU:  No dysuria, change in color of urine, no urgency or frequency.  No flank pain.  Musculoskeletal: No joint pain or swelling.  No decreased range of motion.  No back pain.  Endocrine: No heat intolerance, no cold intolerance, no polyuria, no polydipsia  Psych: No change in mood or affect. No depression or anxiety.  No memory loss.   ALLERGIES:   Allergies  Allergen Reactions   Vancomycin Shortness Of Breath and Itching    PAST MEDICAL HISTORY: Past Medical History:  Diagnosis Date   Anaphylactic shock, unspecified, sequela 06/10/2019   Aortic insufficiency    Aortic stenosis    Arthritis    Asthma, chronic, unspecified asthma severity, with acute exacerbation 10/23/2017   CAP (community acquired pneumonia) 10/23/2017   Carpal tunnel syndrome of right wrist 10/05/2018   Cataract    right - removed by surgery   Cerebral infarction due to thrombosis of cerebral artery (HCC)    Cervical disc herniation 01/04/2020   Chronic diastolic heart failure ([place]) 04/13/2018   Chronic low back pain 11/24/2019   Constipation    COPD (chronic obstructive pulmonary disease) (HCC)    Cough    chronic cough   Encounter for immunization 07/08/2017   ESRD on hemodialysis ([place]) 02/16/2018   Fall 06/04/2019   GERD (gastroesophageal reflux disease) 06/04/2019   GI bleeding 05/24/2017   Gram-negative sepsis, unspecified ([place]) 09/05/2017   History of fusion of cervical spine 03/26/2020   Hyperlipidemia    Hypertension    Hypokalemia 06/04/2017   Hypotension    Iron deficiency anemia, unspecified 09/09/2017  Left shoulder pain 11/11/2019   Lumbar radiculopathy 11/24/2019   Lung contusion 06/04/2019   LVH (left ventricular hypertrophy) due to hypertensive disease, with heart failure ([place]) 06/18/2020   Macrocytic anemia 05/24/2017   Mitral regurgitation    Moderate protein-calorie malnutrition ([place]) 06/06/2017   Myofascial pain syndrome 06/12/2020   Neuritis of  right ulnar nerve 09/13/2018   Non-compliance with renal dialysis ([place]) 02/08/2020   Oxygen deficiency 12/30/2019   O2 sats on RA 87% at PAT appt    Pain in joint of right elbow 09/13/2018   Paroxysmal atrial flutter (HCC)    PEA (Pulseless electrical activity) ([place])    after sedation in 07/2021   Pulmonary hypertension ([place])    Renovascular hypertension 06/22/2020   Restless leg syndrome    Rib fractures 06/2019   Right   S/P cardiac cath 08/27/2020   normal coronary arteries.   Secondary hyperparathyroidism of renal origin ([place]) 06/04/2017   Thoracic ascending aortic aneurysm    Tricuspid regurgitation    Ulnar neuropathy 10/05/2018   Volume overload 10/23/2017    PAST SURGICAL HISTORY: Past Surgical History:  Procedure Laterality Date   A/V FISTULAGRAM N/A 01/01/2021   Procedure: A/V FISTULAGRAM;  Surgeon: Serafina Mitchell, MD;  Location: [facility];  Service: Cardiovascular;  Laterality: N/A;   A/V FISTULAGRAM N/A 01/15/2021   Procedure: A/V [id number];  Surgeon: Serafina Mitchell, MD;  Location: [facility];  Service: Cardiovascular;  Laterality: N/A;   ANTERIOR CERVICAL DECOMP/DISCECTOMY FUSION N/A 01/04/2020   Procedure: ANTERIOR CERVICAL DECOMPRESSION/DISCECTOMY FUSION CERVICAL FIVE THROUGH SEVEN;  Surgeon: Melina Schools, MD;  Location: [facility];  Service: Orthopedics;  Laterality: N/A;  3 hrs   AV FISTULA PLACEMENT Left 05/28/2017   Procedure: LEFT ARM ARTERIOVENOUS (AV) FISTULA CREATION;  Surgeon: Conrad Geneva, MD;  Location: [facility];  Service: Vascular;  Laterality: Left;   AV FISTULA PLACEMENT Left 02/02/2021   Procedure: Debride left forearm, ligation of left upper arm Aretiovenous fistula;  Surgeon: Elam Dutch, MD;  Location: [facility];  Service: Vascular;  Laterality: Left;   AV FISTULA PLACEMENT Right 04/04/2021   Procedure: RIGHT ARTERIOVENOUS (AV) FISTULA CREATION;  Surgeon: Serafina Mitchell, MD;  Location: [facility];  Service: Vascular;  Laterality: Right;    BIOPSY  07/10/2021   Procedure: BIOPSY;  Surgeon: Lavena Bullion, DO;  Location: [facility];  Service: Gastroenterology;;   COLONOSCOPY WITH PROPOFOL N/A 07/10/2021   Procedure: COLONOSCOPY WITH PROPOFOL;  Surgeon: Lavena Bullion, DO;  Location: [facility];  Service: Gastroenterology;  Laterality: N/A;   ESOPHAGOGASTRODUODENOSCOPY (EGD) WITH PROPOFOL N/A 07/10/2021   Procedure: ESOPHAGOGASTRODUODENOSCOPY (EGD) WITH PROPOFOL;  Surgeon: Lavena Bullion, DO;  Location: [facility];  Service: Gastroenterology;  Laterality: N/A;   EYE SURGERY Right 06/02/2019   Cataract removed   FRACTURE SURGERY     INSERTION OF DIALYSIS CATHETER Right 02/02/2021   Procedure: INSERTION OF Right Internal jugular TUNNELED  DIALYSIS CATHETER;  Surgeon: Elam Dutch, MD;  Location: [facility];  Service: Vascular;  Laterality: Right;   IR DIALY SHUNT INTRO [place] W/IMG LEFT Left 09/12/2020   IR FLUORO GUIDE CV LINE RIGHT  05/25/2017   IR FLUORO GUIDE CV LINE RIGHT  07/17/2021   IR THORACENTESIS ASP PLEURAL SPACE W/IMG GUIDE  09/11/2021   IR US GUIDE VASC ACCESS LEFT  09/12/2020   IR US GUIDE VASC ACCESS RIGHT  05/25/2017   IR US GUIDE VASC ACCESS RIGHT  07/17/2021   LIGATION  OF ARTERIOVENOUS  FISTULA Left 01/10/2021   Procedure: LIGATION OF LEFT ARM RADIOCEPHALIC FISTULA;  Surgeon: Serafina Mitchell, MD;  Location[facility];  Service: Vascular;  Laterality: Left;   PERIPHERAL VASCULAR BALLOON ANGIOPLASTY Left 01/15/2021   Procedure: PERIPHERAL VASCULAR BALLOON ANGIOPLASTY;  Surgeon: Serafina Mitchell, MD;  Location: [facility];  Service: Cardiovascular;  Laterality: Left;  AVF   REVISON OF ARTERIOVENOUS FISTULA Left 07/18/2019   Procedure: REVISION PLICATION OF RADIOCEPHALIC ARTERIOVENOUS FISTULA LEFT ARM;  Surgeon: Angelia Mould, MD;  Location: [facility];  Service: Vascular;  Laterality: Left;   REVISON OF ARTERIOVENOUS FISTULA Left 01/10/2021   Procedure: CONVERSION TO BRACHIOCEPHALIC  ARTERIOVENOUS FISTULA;  Surgeon: Serafina Mitchell, MD;  Location: [facility];  Service: Vascular;  Laterality: Left;   RINOPLASTY      MEDICATIONS AT HOME: Prior to Admission medications   Medication Sig Start Date End Date Taking? Authorizing Provider  albuterol (VENTOLIN HFA) 108 (90 Base) MCG/ACT inhaler Inhale 2 puffs into the lungs 2 (two) times daily as needed for shortness of breath or wheezing. 06/01/21   Oswald Hillock, MD  apixaban (ELIQUIS) 2.5 MG TABS tablet Take 1 tablet (2.5 mg total) by mouth 2 (two) times daily. 09/13/21 10/13/21  Elgergawy, Silver Huguenin, MD  cinacalcet (SENSIPAR) 60 MG tablet Take 1 tablet (60 mg total) by mouth every evening. Patient not taking: Reported on 07/16/2021 06/01/21   Oswald Hillock, MD  diltiazem (CARDIZEM CD) 240 MG 24 hr capsule Take 1 capsule (240 mg total) by mouth daily. 09/13/21   Elgergawy, Silver Huguenin, MD  feeding supplement (ENSURE ENLIVE / ENSURE PLUS) LIQD Take 237 mLs by mouth 2 (two) times daily between meals. 09/13/21   Elgergawy, Silver Huguenin, MD  fluticasone furoate-vilanterol (BREO ELLIPTA) 100-25 MCG/ACT AEPB Inhale 1 puff into the lungs daily.    [provider]  midodrine (PROAMATINE) 10 MG tablet Take 1 tablet (10 mg total) by mouth 3 (three) times daily with meals. 09/13/21   Elgergawy, Silver Huguenin, MD  multivitamin (RENA-VIT) TABS tablet Take 1 tablet by mouth at bedtime. 01/12/18   [provider]  sevelamer carbonate (RENVELA) 800 MG tablet Take 800 mg by mouth 3 (three) times daily with meals. 05/31/21   [provider]    FAMILY HISTORY: Family History  Problem Relation Age of Onset   Cancer Mother       SOCIAL HISTORY:  reports that he has never smoked. He has never used smokeless tobacco. He reports that he does not currently use alcohol. He reports that he does not use drugs.  PHYSICAL EXAM: Blood pressure (!) 139/101, pulse (!) 116, temperature 97.6 F (36.4 C), temperature source Oral, resp. rate (!) 28, SpO2  94 %.  General appearance :Awake, alert, not in any distress.  Appears cachectic and chronically sick appearing. Eyes:pupils equally reactive to light and accomodation,no scleral icterus.Pink conjunctiva HEENT: Atraumatic and Normocephalic Neck: supple, no JVD.  Resp:Good air entry bilaterally, no added sounds few bibasilar rales. CVS: S1 S2 regular, no murmurs.  GI: Bowel sounds present, Non tender and not distended with no gaurding, rigidity or rebound. Extremities: B/L Lower Ext shows no edema, both legs are warm to touch Neurology:  speech clear,Non focal, sensation is grossly intact. Psychiatric: Normal judgment and insight. Alert and oriented x 3. Normal mood. Musculoskeletal:gait appears to be normal.No digital cyanosis Skin:No Rash, warm and dry Wounds:N/A  LABS ON ADMISSION:  I have personally reviewed following labs and imaging studies  CBC: Recent Labs  Lab 09/19/21 1443 09/19/21 1509  WBC 6.1  --   NEUTROABS 4.7  --   HGB 10.4* 11.2*  HCT 34.8* 33.0*  MCV 104.2*  --   PLT 215  --     Basic Metabolic Panel: Recent Labs  Lab 09/13/21 0750 09/19/21 1443 09/19/21 1509  NA 136 142 137  K 4.7 4.9 5.5*  CL 97* 96*  --   CO2 26 26  --   GLUCOSE 88 107*  --   BUN 38* 27*  --   CREATININE 5.14* 4.95*  --   CALCIUM 10.3 9.2  --   PHOS 5.4*  --   --     GFR: Estimated Creatinine Clearance: 10.9 mL/min (A) (by C-G formula based on SCr of 4.95 mg/dL (H)).  Liver Function Tests: Recent Labs  Lab 09/13/21 0750 09/19/21 1443  AST  --  19  ALT  --  10  ALKPHOS  --  120  BILITOT  --  1.0  PROT  --  6.8  ALBUMIN 2.6* 2.8*   No results for input(s): LIPASE, AMYLASE in the last 168 hours. No results for input(s): AMMONIA in the last 168 hours.  Coagulation Profile: No results for input(s): INR, PROTIME in the last 168 hours.  Cardiac Enzymes: No results for input(s): CKTOTAL, CKMB, CKMBINDEX, TROPONINI in the last 168 hours.  BNP (last 3 results) No  results for input(s): PROBNP in the last 8760 hours.  HbA1C: No results for input(s): HGBA1C in the last 72 hours.  CBG: No results for input(s): GLUCAP in the last 168 hours.  Lipid Profile: No results for input(s): CHOL, HDL, LDLCALC, TRIG, CHOLHDL, LDLDIRECT in the last 72 hours.  Thyroid Function Tests: No results for input(s): TSH, T4TOTAL, FREET4, T3FREE, THYROIDAB in the last 72 hours.  Anemia Panel: No results for input(s): VITAMINB12, FOLATE, FERRITIN, TIBC, IRON, RETICCTPCT in the last 72 hours.  Urine analysis:    Component Value Date/Time   COLORURINE YELLOW 05/24/2017 1729   APPEARANCEUR HAZY (A) 05/24/2017 1729   LABSPEC 1.011 05/24/2017 1729   PHURINE 5.0 05/24/2017 1729   GLUCOSEU 50 (A) 05/24/2017 1729   HGBUR SMALL (A) 05/24/2017 1729   BILIRUBINUR NEGATIVE 05/24/2017 1729   KETONESUR NEGATIVE 05/24/2017 1729   PROTEINUR 100 (A) 05/24/2017 1729   UROBILINOGEN 0.2 04/09/2015 1527   NITRITE NEGATIVE 05/24/2017 1729   LEUKOCYTESUR TRACE (A) 05/24/2017 1729    Sepsis Labs: Lactic Acid, Venous    Component Value Date/Time   LATICACIDVEN 1.4 09/19/2021 1443     Microbiology: Recent Results (from the past 240 hour(s))  Culture, body fluid w Gram Stain-bottle     Status: None   Collection Time: 09/11/21  1:16 PM   Specimen: Fluid  Result Value Ref Range Status   Specimen Description FLUID RIGHT PLEURAL  Final   Special Requests BOTTLES DRAWN AEROBIC AND ANAEROBIC 10CC  Final   Culture   Final    NO GROWTH 5 DAYS Performed at [facility], [place] [address], [place],  [address]    Report Status 09/16/2021 FINAL  Final  Gram stain     Status: None   Collection Time: 09/11/21  1:16 PM   Specimen: Fluid  Result Value Ref Range Status   Specimen Description FLUID RIGHT PLEURAL  Final   Special Requests NONE  Final   Gram Stain   Final    RARE WBC PRESENT, PREDOMINANTLY MONONUCLEAR NO ORGANISMS SEEN Performed at [facility],  [place] [address] [place], [place] [address]    Report Status 09/11/2021 FINAL  Final      RADIOLOGIC STUDIES ON ADMISSION: DG Chest Port 1 View  Result Date: 09/19/2021 CLINICAL DATA:  Concern for sepsis EXAM: PORTABLE CHEST 1 VIEW COMPARISON:  Chest x-ray dated September 11, 2021 FINDINGS: Cardiac and mediastinal contours are unchanged. Stable position of right central venous line. Diffuse bilateral interstitial opacities. No focal consolidation. Small right-greater-than-left pleural effusions, right pleural effusion is increased in size. No evidence of pneumothorax. IMPRESSION: 1. Similar diffuse bilateral interstitial opacities, likely due to pulmonary edema. 2. Increased small right pleural effusion. Electronically Signed   By: Yetta Glassman M.D.   On: 09/19/2021 14:22    I have personally reviewed images of chest xray-: Pulm edema, right-sided pleural effusion  EKG:  Personally reviewed.  NSR-QTC slightly prolonged at 525.  LVH pattern.  ASSESSMENT AND PLAN: * Acute respiratory failure with hypoxemia due to volume overload due to noncompliance with hemodialysis - (present on admission) Appears comfortable but requiring around 4-5 L of oxygen.  Nephrology consulted for hemodialysis.  Reassess after HD/adequate volume removal-if still hypoxic-may need further work-up.  Recurrent pleural effusion on right- (present on admission) Reviewed most recent discharge summary-prior thoracocentesis consistent with a transudate.  I suspect this is due to noncompliance with hemodialysis.  ESRD on dialysis on HD MWF-but noncompliant Noncompliance with HD-nephrology consulted-we will defer further to nephrology service.  Non-compliance with renal dialysis ([place]) Continue to counsel.  Anemia in ESRD (end-stage renal disease) ([place])- (present on admission) Will defer Aranesp/iron to nephrology service.  Essential hypertension- (present on admission) BP stable-on midodrine-reviewed prior notes during his  most recent hospitalization-BP has been soft.  Suspect patient is on Cardizem for rate control.  We will continue both of these agents and follow closely.  Persistent atrial fibrillation ([place])- (present on admission) Continue Eliquis and Cardizem.  Chronic diastolic (congestive) heart failure ([place])- (present on admission) Has volume overload-but this is due to missed HD-diuresis with hemodialysis.  Prolonged QT interval- (present on admission) Avoid QT prolonging agents-check magnesium with morning labs-watch closely in telemetry.  Appears to be present on prior EKGs as well.  Atypical chest pain- (present on admission) Suspect atypical-due to volume overload-EKG without acute changes.  Initial troponin minimally elevated-we will continue to trend troponin.  Pulmonary emboli ([place])- (present on admission) Small PE seen on recent CT chest on 08/26/2021-continue Eliquis.  Unclear how compliant he has been.  Cachexia ([place])- (present on admission) Apparently have significant weight loss over the past several months-this is unintentional.  Recent TSH without signs of hyperthyroidism.  Recent HIV test was negative.  CT chest did not show any malignancy-cytology from right pleural fluid x2 late last year was negative for malignancy.  Have advised to follow-up with his PCP for further work-up to be done in the outpatient setting.  Suspect it may be due to ESRD-and noncompliance with dialysis.   Further plan will depend as patient's clinical course evolves and further radiologic and laboratory data become available. Patient will be monitored closely.  Above noted plan was discussed with patient face to face at bedside, he was in agreement.   CONSULTS: [place]   DVT Prophylaxis: Prophylactic Heparin   Code Status: Full Code   Disposition Plan:  Discharge back home in the next 1-2 days after volume status stabilizes with HD.  Admission status:Observation going to tele  The medical  decision making on this patient was of high  complexity and the patient is at high risk for clinical deterioration, therefore this is a level 3 visit.   Total time spent  55 minutes.Greater than 50% of this time was spent in counseling, explanation of diagnosis, planning of further management, and coordination of care.  Severity of illness: The appropriate patient status for this patient is OBSERVATION. Observation status is judged to be reasonable and necessary in order to provide the required intensity of service to ensure the patient's safety. The patient's presenting symptoms, physical exam findings, and initial radiographic and laboratory data in the context of their medical condition is felt to place them at decreased risk for further clinical deterioration. Furthermore, it is anticipated that the patient will be medically stable for discharge from the hospital within 2 midnights of admission. The following factors support the patient status of observation.   " The patient's presenting symptoms include shortness of breath-volume overload. " The physical exam findings include hypoxia. " The initial radiographic and laboratory data are pulmonary edema CXR.     Oren Binet [facility] Pager [phone]  If 7PM-7AM, please contact night-coverage  Please page via [email or web address]  Go to [email or web address] and use [place]'s universal password to access. If you do not have the password, please contact the hospital operator.  Locate the [facility] provider you are looking for under [facility] and page to a number that you can be directly reached. If you still have difficulty reaching the provider, please page the [facility] (Director on Call) for the Hospitalists listed on amion for assistance.  09/19/2021, 5:29 PM

## 2021-09-19 NOTE — Assessment & Plan Note (Signed)
Small PE seen on recent CT chest on 08/26/2021-continue Eliquis.  Unclear how compliant he has been.

## 2021-09-19 NOTE — Progress Notes (Signed)
Overnight progress note  Informed by RN that patient is complaining of pain in his chest and "all over."  He is refusing Tylenol and requesting stronger pain medications/threatening to leave AMA.  No change in vital signs.  Per review of home medications, he does not take any medications for chronic pain.  -Stat EKG and stat troponin ordered -Percocet 5-325 mg x 1 ordered for pain

## 2021-09-19 NOTE — Assessment & Plan Note (Addendum)
Appears to be in prior EKGs as well-K/Mg stable.  He was monitored in telemetry initially-but refused further telemetry monitoring.  Continue to follow periodically in the outpatient setting.  Appears to be mild-avoid QTC prolonging agents.  PCP to consider rechecking twelve-lead EKG at next follow-up.

## 2021-09-19 NOTE — Assessment & Plan Note (Addendum)
Has been counseled extensively.

## 2021-09-19 NOTE — Assessment & Plan Note (Addendum)
Discussed with nephrology-he apparently has been cutting short his outpatient hemodialysis.  Longstanding history of noncompliance.  Hypoxia/shortness of breath much better after HD x2-plans are to resume usual outpatient HD on MWF.

## 2021-09-19 NOTE — Assessment & Plan Note (Addendum)
Resolved-atypical chest pain in the setting of volume overload.  Troponins minimally elevated and flat-likely insignificant.

## 2021-09-19 NOTE — Assessment & Plan Note (Signed)
Apparently have significant weight loss over the past several months-this is unintentional.  Recent TSH without signs of hyperthyroidism.  Recent HIV test was negative.  CT chest did not show any malignancy-cytology from right pleural fluid x2 late last year was negative for malignancy.  Have advised to follow-up with his PCP for further work-up to be done in the outpatient setting.  Suspect it may be due to ESRD-and noncompliance with dialysis.

## 2021-09-19 NOTE — ED Provider Notes (Signed)
°  Physical Exam  BP (!) 134/92    Pulse 68    Temp 97.6 F (36.4 C) (Oral)    Resp (!) 23    SpO2 98%   Physical Exam  Procedures  .Critical Care Performed by: Varney Biles, MD Authorized by: Varney Biles, MD   Critical care provider statement:    Critical care time (minutes):  30   Critical care was necessary to treat or prevent imminent or life-threatening deterioration of the following conditions:  Renal failure and respiratory failure   Critical care was time spent personally by me on the following activities:  Development of treatment plan with patient or surrogate, discussions with consultants, evaluation of patient's response to treatment, examination of patient, ordering and review of laboratory studies, ordering and review of radiographic studies, ordering and performing treatments and interventions, pulse oximetry, re-evaluation of patient's condition and review of old charts  ED Course / MDM    Medical Decision Making Amount and/or Complexity of Data Reviewed Labs: ordered. Radiology: ordered. ECG/medicine tests: ordered.  Risk Prescription drug management.   61 year old male with history of ESRD on hemodialysis, PE on Eliquis comes in with chief complaint of shortness of breath.  He is noted to have acute on chronic respiratory failure.  Concerns for volume overload.  Discussed case with Dr. Carolin Sicks, nephrologist.  They have not seen the patient and recommend admission to the hospital.  Patient will need urgent hemodialysis later tonight.  Patient's blood work has been reviewed.  He has probably mixed acid-base disorder with alkalosis.  He stable at this time on oxygen.  Admission plan discussed with him.         Varney Biles, MD 09/19/21 1625

## 2021-09-19 NOTE — ED Provider Notes (Incomplete)
I assumed the care of Remo Lipps B. Happe from [employer] at [employer].    Physical Exam  BP 122/78    Pulse (!) 120    Temp 97.6 F (36.4 C) (Oral)    Resp 19    SpO2 91%    ED Course / MDM    Medical Decision Making Amount and/or Complexity of Data Reviewed Labs: ordered. Radiology: ordered. ECG/medicine tests: ordered.  Risk Prescription drug management.   ***   ESRD HF Asthma 2.5 L [place] at baseline. Now on 4L.   CXR w/ pulmonary edema. Similar presentation to 1 week ago. Nephrology aware of the patient's presentation.   HD yesterday.  Yesterday after HD started having SOB and chest pressure.  Associated N/v.

## 2021-09-19 NOTE — Assessment & Plan Note (Addendum)
Feels significantly better after HD x2.  Feels he is close to baseline.  He will resume regular home O2 at 2.5 L on discharge.  He is aware that he will need to follow-up with his hemodialysis center at his usual schedule post discharge.  Has been counseled regarding importance of completing dialysis session-he apparently has been noncompliant and also cutting short his dialysis sessions.

## 2021-09-20 ENCOUNTER — Other Ambulatory Visit: Payer: Self-pay

## 2021-09-20 DIAGNOSIS — Z8673 Personal history of transient ischemic attack (TIA), and cerebral infarction without residual deficits: Secondary | ICD-10-CM | POA: Diagnosis not present

## 2021-09-20 DIAGNOSIS — Z7951 Long term (current) use of inhaled steroids: Secondary | ICD-10-CM | POA: Diagnosis not present

## 2021-09-20 DIAGNOSIS — Z681 Body mass index (BMI) 19 or less, adult: Secondary | ICD-10-CM | POA: Diagnosis not present

## 2021-09-20 DIAGNOSIS — G2581 Restless legs syndrome: Secondary | ICD-10-CM | POA: Diagnosis present

## 2021-09-20 DIAGNOSIS — N2581 Secondary hyperparathyroidism of renal origin: Secondary | ICD-10-CM | POA: Diagnosis present

## 2021-09-20 DIAGNOSIS — Z20822 Contact with and (suspected) exposure to covid-19: Secondary | ICD-10-CM | POA: Diagnosis present

## 2021-09-20 DIAGNOSIS — R0602 Shortness of breath: Secondary | ICD-10-CM | POA: Diagnosis present

## 2021-09-20 DIAGNOSIS — Z992 Dependence on renal dialysis: Secondary | ICD-10-CM | POA: Diagnosis not present

## 2021-09-20 DIAGNOSIS — E785 Hyperlipidemia, unspecified: Secondary | ICD-10-CM | POA: Diagnosis present

## 2021-09-20 DIAGNOSIS — E8770 Fluid overload, unspecified: Secondary | ICD-10-CM | POA: Diagnosis present

## 2021-09-20 DIAGNOSIS — J9601 Acute respiratory failure with hypoxia: Secondary | ICD-10-CM | POA: Diagnosis not present

## 2021-09-20 DIAGNOSIS — Z981 Arthrodesis status: Secondary | ICD-10-CM | POA: Diagnosis not present

## 2021-09-20 DIAGNOSIS — R64 Cachexia: Secondary | ICD-10-CM | POA: Diagnosis present

## 2021-09-20 DIAGNOSIS — Z9115 Patient's noncompliance with renal dialysis: Secondary | ICD-10-CM | POA: Diagnosis not present

## 2021-09-20 DIAGNOSIS — R0789 Other chest pain: Secondary | ICD-10-CM | POA: Diagnosis not present

## 2021-09-20 DIAGNOSIS — J449 Chronic obstructive pulmonary disease, unspecified: Secondary | ICD-10-CM | POA: Diagnosis present

## 2021-09-20 DIAGNOSIS — I272 Pulmonary hypertension, unspecified: Secondary | ICD-10-CM | POA: Diagnosis present

## 2021-09-20 DIAGNOSIS — I4819 Other persistent atrial fibrillation: Secondary | ICD-10-CM | POA: Diagnosis present

## 2021-09-20 DIAGNOSIS — E43 Unspecified severe protein-calorie malnutrition: Secondary | ICD-10-CM | POA: Diagnosis present

## 2021-09-20 DIAGNOSIS — Z86711 Personal history of pulmonary embolism: Secondary | ICD-10-CM | POA: Diagnosis not present

## 2021-09-20 DIAGNOSIS — I132 Hypertensive heart and chronic kidney disease with heart failure and with stage 5 chronic kidney disease, or end stage renal disease: Secondary | ICD-10-CM | POA: Diagnosis present

## 2021-09-20 DIAGNOSIS — D631 Anemia in chronic kidney disease: Secondary | ICD-10-CM | POA: Diagnosis present

## 2021-09-20 DIAGNOSIS — Z7901 Long term (current) use of anticoagulants: Secondary | ICD-10-CM | POA: Diagnosis not present

## 2021-09-20 DIAGNOSIS — J9621 Acute and chronic respiratory failure with hypoxia: Secondary | ICD-10-CM | POA: Diagnosis present

## 2021-09-20 DIAGNOSIS — K219 Gastro-esophageal reflux disease without esophagitis: Secondary | ICD-10-CM | POA: Diagnosis present

## 2021-09-20 DIAGNOSIS — N186 End stage renal disease: Secondary | ICD-10-CM | POA: Diagnosis present

## 2021-09-20 DIAGNOSIS — I5032 Chronic diastolic (congestive) heart failure: Secondary | ICD-10-CM | POA: Diagnosis present

## 2021-09-20 LAB — PROTIME-INR
INR: 1.3 — ABNORMAL HIGH (ref 0.8–1.2)
Prothrombin Time: 16.6 seconds — ABNORMAL HIGH (ref 11.4–15.2)

## 2021-09-20 LAB — RENAL FUNCTION PANEL
Albumin: 2.9 g/dL — ABNORMAL LOW (ref 3.5–5.0)
Anion gap: 12 (ref 5–15)
BUN: 16 mg/dL (ref 8–23)
CO2: 26 mmol/L (ref 22–32)
Calcium: 8.3 mg/dL — ABNORMAL LOW (ref 8.9–10.3)
Chloride: 99 mmol/L (ref 98–111)
Creatinine, Ser: 3.63 mg/dL — ABNORMAL HIGH (ref 0.61–1.24)
GFR, Estimated: 18 mL/min — ABNORMAL LOW (ref >60)
Glucose, Bld: 84 mg/dL (ref 70–99)
Phosphorus: 3.3 mg/dL (ref 2.5–4.6)
Potassium: 4.4 mmol/L (ref 3.5–5.1)
Sodium: 137 mmol/L (ref 135–145)

## 2021-09-20 LAB — CBC
HCT: 34.7 % — ABNORMAL LOW (ref 39.0–52.0)
Hemoglobin: 10.9 g/dL — ABNORMAL LOW (ref 13.0–17.0)
MCH: 32.2 pg (ref 26.0–34.0)
MCHC: 31.4 g/dL (ref 30.0–36.0)
MCV: 102.4 fL — ABNORMAL HIGH (ref 80.0–100.0)
Platelets: 192 10*3/uL (ref 150–400)
RBC: 3.39 MIL/uL — ABNORMAL LOW (ref 4.22–5.81)
RDW: 16.1 % — ABNORMAL HIGH (ref 11.5–15.5)
WBC: 4.9 10*3/uL (ref 4.0–10.5)
nRBC: 0 % (ref 0.0–0.2)

## 2021-09-20 LAB — APTT: aPTT: 41 seconds — ABNORMAL HIGH (ref 24–36)

## 2021-09-20 LAB — MAGNESIUM: Magnesium: 2.2 mg/dL (ref 1.7–2.4)

## 2021-09-20 MED ORDER — SODIUM CHLORIDE 0.9 % IV SOLN: 100.0000 mL | INTRAVENOUS | Status: DC | PRN

## 2021-09-20 MED ORDER — OXYCODONE HCL 5 MG PO TABS
5.0000 mg | ORAL_TABLET | Freq: Four times a day (QID) | ORAL | Status: DC | PRN
  Administered 2021-09-20 (×2): 5 mg via ORAL
  Filled 2021-09-20 (×3): qty 1

## 2021-09-20 MED ORDER — DILTIAZEM HCL ER COATED BEADS 180 MG PO CP24: 300.0000 mg | ORAL_CAPSULE | Freq: Every day | ORAL | Status: DC

## 2021-09-20 MED ORDER — OXYCODONE HCL 5 MG PO TABS
5.0000 mg | ORAL_TABLET | Freq: Once | ORAL | Status: AC
  Administered 2021-09-20: 5 mg via ORAL
  Filled 2021-09-20: qty 1

## 2021-09-20 MED ORDER — METOPROLOL TARTRATE 5 MG/5ML IV SOLN: 2.5000 mg | INTRAVENOUS | Status: DC | PRN

## 2021-09-20 MED ORDER — ORAL CARE MOUTH RINSE
15.0000 mL | Freq: Two times a day (BID) | OROMUCOSAL | Status: DC
  Administered 2021-09-20: 15 mL via OROMUCOSAL

## 2021-09-20 NOTE — ED Notes (Signed)
Pt just now returning from dialysis stating "I don't have a room yet? This is ridiculous, get me a wheelchair and get me the hell out of this place." MD made aware

## 2021-09-20 NOTE — TOC Initial Note (Signed)
Transition of Care [facility]) - Initial/Assessment Note    Patient Details  Name: Thomas Robinson MRN: [id number] Date of Birth: [birth year]  Transition of [facility]) CM/SW Contact:    Verdell Carmine, RN Phone Number: 09/20/2021, 5:02 PM  Clinical Narrative:                  Presented with [facility], just discharged 1 week ago. Noncompliant with treatments, ex dialysis. Was set up with [facility] last week when in hospital. Was discharged last week without oxygen qualifications, necccesitating quals in the office, however this was not done and he was never set up., according to [employer] He will need recalcification for oxygen this admission. CM will follow for needs, recommendations, and transitions.   Barriers to Discharge: Continued Medical Work up   Patient Goals and CMS Choice        Expected Discharge Plan and Services     Discharge Planning Services: CM Consult   Living arrangements for the past 2 months: Apartment                                      Prior Living Arrangements/Services Living arrangements for the past 2 months: Apartment Lives with:: Roommate                   Activities of Daily Living      Permission Sought/Granted                  Emotional Assessment       Orientation: : Oriented to Self, Oriented to Place, Oriented to  Time, Oriented to Situation Alcohol / Substance Use: Not Applicable    Admission diagnosis:  SOB (shortness of breath) [R06.02] Volume overload [E87.70] Acute respiratory failure with hypoxemia (HCC) [J96.01] Patient Active Problem List   Diagnosis Date Noted   Acute on chronic respiratory failure with hypoxemia due to volume overload due to noncompliance with hemodialysis  09/19/2021   Persistent atrial fibrillation ([place]) 09/19/2021   Pulmonary emboli ([place]) 09/19/2021   Cachexia ([place]) 09/19/2021   Protein-calorie malnutrition, severe 09/10/2021   Chronic respiratory failure with hypoxia ([place])  09/07/2021   Fever 09/07/2021   Hypotension 09/07/2021   Acute on chronic diastolic (congestive) heart failure (HCC) 08/26/2021   Fluid overload 08/25/2021   Atrial fibrillation with RVR ([place]) 08/25/2021   Sacral pressure sore 08/12/2021   Shortness of breath 07/16/2021   Balanitis 07/16/2021   Vomiting    Gastritis and gastroduodenitis    Grade III hemorrhoids    Decubitus ulcer of coccyx, stage 2 ([place]) 07/08/2021   Prolonged QT interval 07/08/2021   Hematochezia    Atrial flutter (HCC)    Pressure injury of skin 07/02/2021   Abnormal chest CT 06/30/2021   Recurrent pleural effusion on right 06/30/2021   SIRS (systemic inflammatory response syndrome) ([place]) 06/30/2021   Acute hypoxemic respiratory failure ([place]) 05/18/2021   Bronchitis 05/18/2021   Preoperative cardiovascular examination 03/06/2021   CHF (congestive heart failure) ([place]) 02/17/2021   Cellulitis of arm, left 02/01/2021   Cellulitis of left lower limb 02/01/2021   Cellulitis, unspecified 02/01/2021   Left arm swelling 01/25/2021   Acute and chronic respiratory failure with hypoxia ([place]) 01/13/2021   Acute on chronic respiratory failure with hypoxia ([place]) 01/12/2021   Concussion with no loss of consciousness 01/12/2021   Laceration without foreign body of left forearm, initial encounter 01/12/2021  Age-related physical debility 09/20/2020   Allergy, unspecified, initial encounter 09/20/2020   Nausea 09/20/2020   Pain, unspecified 09/20/2020   Pruritus, unspecified 09/20/2020   S/P cardiac cath 08/27/2020   Acute encephalopathy 08/26/2020   Peripheral neuropathy 08/26/2020   Elevated troponin 08/25/2020   Hyperkalemia 08/05/2020   Arthritis    Asthma    Cataract    Cerebral infarction due to thrombosis of cerebral artery (HCC)    History of CVA (cerebrovascular accident)    Chronic kidney disease    Dyspnea    Hyperlipidemia    Hypertension    Kidney failure    Restless leg syndrome    Degenerative  lumbar spinal stenosis 62/70/3500   Diastolic dysfunction 93/81/8299   Renovascular hypertension 06/22/2020   Demand ischemia ([place]) 06/18/2020   LVH (left ventricular hypertrophy) due to hypertensive disease, with heart failure ([place]) 06/18/2020   Myofascial pain syndrome 06/12/2020   Encounter for removal of sutures 03/29/2020   History of fusion of cervical spine 03/26/2020   Non-compliance with renal dialysis ([place]) 02/08/2020   Pulmonary edema 01/23/2020   Cervical disc herniation 01/04/2020   Tachycardia 01/04/2020   SOB (shortness of breath)    Hypoxia 12/30/2019   Chronic low back pain 11/24/2019   Degeneration of lumbar intervertebral disc 11/24/2019   Lumbar radiculopathy 11/24/2019   Left shoulder pain 11/11/2019   Anaphylactic shock, unspecified, sequela 06/10/2019   Rib fractures 06/05/2019   Fall at home, initial encounter 06/04/2019   Right rib fracture 06/04/2019   Lung contusion 06/04/2019   Acute respiratory failure with hypoxia ([place]) 06/04/2019   Anemia in ESRD (end-stage renal disease) ([place]) 06/04/2019   GERD without esophagitis 06/04/2019   Chronic diastolic (congestive) heart failure ([place]) 06/04/2019   Fall 06/04/2019   Thoracic ascending aortic aneurysm 06/04/2019   Acute exacerbation of congestive heart failure ([place]) 05/16/2019   Carpal tunnel syndrome of right wrist 10/05/2018   Ulnar neuropathy 10/05/2018   Neuritis of right ulnar nerve 09/13/2018   Pain in joint of right elbow 09/13/2018   Chest pain 04/13/2018   Chronic diastolic heart failure ([employer]) 04/13/2018   Acute on chronic diastolic CHF (congestive heart failure) ([place]) 04/13/2018   Atypical chest pain 02/16/2018   ESRD on dialysis on HD MWF-but noncompliant 02/16/2018   End stage renal disease ([place]) 02/16/2018   CAP (community acquired pneumonia) 10/23/2017   Asthma, chronic, unspecified asthma severity, with acute exacerbation 10/23/2017   Respiratory failure, acute ([place]) 10/23/2017    Pulmonary hypertension ([place]) 10/23/2017   Iron deficiency anemia, unspecified 09/09/2017   Gram-negative sepsis, unspecified ([place]) 09/05/2017   Encounter for immunization 07/08/2017   Moderate protein-calorie malnutrition ([place]) 06/06/2017   Coagulation defect, unspecified ([name]) 06/04/2017   Hypokalemia 06/04/2017   Other hyperlipidemia 06/04/2017   Secondary hyperparathyroidism of renal origin ([place]) 06/04/2017   Macrocytic anemia 05/24/2017   Uremia 37/16/9678   Metabolic acidosis 93/81/0175   Hypertensive urgency 05/24/2017   Acute kidney injury superimposed on CKD ([place]) 05/24/2017   CKD (chronic kidney disease), stage V ([place]) 05/24/2017   GI bleeding 05/24/2017   Anemia of chronic disease 05/24/2017   Stroke ([place]) 2018   AKI (acute kidney injury) ([place]) 04/10/2015   History of completed stroke    Essential hypertension 04/09/2015   PCP:  [name], PA Pharmacy:   [id number] [address], [place] - [address] [place] [address] Phone: [phone] Fax: [phone]  [facility] - [place], [place] - [address] [employer], [address] [address] Phone: [phone] Fax: [phone]  [facility] [address] [place] [address] Phone: [phone] Fax: [phone]     Social Determinants of Health (SDOH) Interventions    Readmission Risk Interventions Readmission Risk Prevention Plan 09/08/2021 01/15/2021 09/11/2020  Transportation Screening Complete Complete Complete  Medication Review (RN Care Manager) Complete Complete -  PCP or Specialist appointment within 3-5 days of discharge - - Not Complete  PCP/Specialist Appt Not Complete comments - - first available appt is 09/26/20  [place] or Home Care Consult Complete Complete (No Data)  SW Recovery Care/Counseling Consult Complete Complete Complete  Palliative Care Screening Not Applicable Not Applicable Not  [place] Not Applicable Not Applicable Not Applicable  Some recent data might be hidden

## 2021-09-20 NOTE — ED Notes (Signed)
Pt states that he cannot make it to [facility] for dialysis today. Pt states he is unsure that they can get him in tomorrow.

## 2021-09-20 NOTE — ED Notes (Signed)
Patient incont of stool, cleaned patient and placed in a hospital bed. Pillows and warm blanket given

## 2021-09-20 NOTE — Progress Notes (Signed)
[facility] Renal Progress Note    Indication for Consultation:  Management of ESRD/hemodialysis; anemia, hypertension/volume and secondary hyperparathyroidism  VWU:[id number], Desiree, PA  HPI: Thomas Robinson is a 61 y.o. male with ESRD on HD MWF at [facility]. He has a past medical history significant for  HTN, HLD, CVA, and noncompliance with hemodialysis, medications, and follow-up visits. Patient presents to the ED c/o SOB d/t volume overload. Noted previous hospitalization 09/07/21-09/13/21 for the same issue. Our service consulted for hemodialysis management.  Unfortunately, patient continues to be non-compliant with hemodialysis despite ongoing discussions from our team on the importance of staying on HD for full treatment. Last HD on 09/18/21 and noted patient ran for only 2 hours. Seen and examined patient at bedside in ED. Currently not in respiratory distress and on 3L [place]. Noted HR ranging in 110-120s. Currently denies SOB, CP, and N/V.   Interval History: He received HD overnight and tolerated net UF 3.5L. Notable labs include: K+ 4.4, BUN 16, Ca 8.3, PO4 3.3, Albumin 2.9, and Hgb 10.9. CXR shows diffuse BL opacities consistent with pulmonary edema and increased small right pleural effusion. Plan for HD tomorrow 09/21/21.   Past Medical History:  Diagnosis Date   Anaphylactic shock, unspecified, sequela 06/10/2019   Aortic insufficiency    Aortic stenosis    Arthritis    Asthma, chronic, unspecified asthma severity, with acute exacerbation 10/23/2017   CAP (community acquired pneumonia) 10/23/2017   Carpal tunnel syndrome of right wrist 10/05/2018   Cataract    right - removed by surgery   Cerebral infarction due to thrombosis of cerebral artery (HCC)    Cervical disc herniation 01/04/2020   Chronic diastolic heart failure ([place]) 04/13/2018   Chronic low back pain 11/24/2019   Constipation    COPD (chronic obstructive pulmonary disease) (HCC)    Cough     chronic cough   Encounter for immunization 07/08/2017   ESRD on hemodialysis ([place]) 02/16/2018   Fall 06/04/2019   GERD (gastroesophageal reflux disease) 06/04/2019   GI bleeding 05/24/2017   Gram-negative sepsis, unspecified ([place]) 09/05/2017   History of fusion of cervical spine 03/26/2020   Hyperlipidemia    Hypertension    Hypokalemia 06/04/2017   Hypotension    Iron deficiency anemia, unspecified 09/09/2017   Left shoulder pain 11/11/2019   Lumbar radiculopathy 11/24/2019   Lung contusion 06/04/2019   LVH (left ventricular hypertrophy) due to hypertensive disease, with heart failure ([place]) 06/18/2020   Macrocytic anemia 05/24/2017   Mitral regurgitation    Moderate protein-calorie malnutrition ([place]) 06/06/2017   Myofascial pain syndrome 06/12/2020   Neuritis of right ulnar nerve 09/13/2018   Non-compliance with renal dialysis ([place]) 02/08/2020   Oxygen deficiency 12/30/2019   O2 sats on RA 87% at PAT appt    Pain in joint of right elbow 09/13/2018   Paroxysmal atrial flutter (HCC)    PEA (Pulseless electrical activity) ([place])    after sedation in 07/2021   Pulmonary hypertension ([place])    Renovascular hypertension 06/22/2020   Restless leg syndrome    Rib fractures 06/2019   Right   S/P cardiac cath 08/27/2020   normal coronary arteries.   Secondary hyperparathyroidism of renal origin ([place]) 06/04/2017   Thoracic ascending aortic aneurysm    Tricuspid regurgitation    Ulnar neuropathy 10/05/2018   Volume overload 10/23/2017   Past Surgical History:  Procedure Laterality Date   A/V FISTULAGRAM N/A 01/01/2021   Procedure: A/V FISTULAGRAM;  Surgeon: Serafina Mitchell,  MD;  Location: [facility];  Service: Cardiovascular;  Laterality: N/A;   A/V FISTULAGRAM N/A 01/15/2021   Procedure: A/V [id number];  Surgeon: Serafina Mitchell, MD;  Location: [place];  Service: Cardiovascular;  Laterality: N/A;   ANTERIOR CERVICAL DECOMP/DISCECTOMY FUSION N/A 01/04/2020    Procedure: ANTERIOR CERVICAL DECOMPRESSION/DISCECTOMY FUSION CERVICAL FIVE THROUGH SEVEN;  Surgeon: Melina Schools, MD;  Location: [place];  Service: Orthopedics;  Laterality: N/A;  3 hrs   AV FISTULA PLACEMENT Left 05/28/2017   Procedure: LEFT ARM ARTERIOVENOUS (AV) FISTULA CREATION;  Surgeon: Conrad [place], MD;  Location: [place];  Service: Vascular;  Laterality: Left;   AV FISTULA PLACEMENT Left 02/02/2021   Procedure: Debride left forearm, ligation of left upper arm Aretiovenous fistula;  Surgeon: Elam Dutch, MD;  Location: [place];  Service: Vascular;  Laterality: Left;   AV FISTULA PLACEMENT Right 04/04/2021   Procedure: RIGHT ARTERIOVENOUS (AV) FISTULA CREATION;  Surgeon: Serafina Mitchell, MD;  Location: [place] [facility];  Service: Vascular;  Laterality: Right;   BIOPSY  07/10/2021   Procedure: BIOPSY;  Surgeon: Lavena Bullion, DO;  Location: [place] [facility];  Service: Gastroenterology;;   COLONOSCOPY WITH PROPOFOL N/A 07/10/2021   Procedure: COLONOSCOPY WITH PROPOFOL;  Surgeon: Lavena Bullion, DO;  Location: [place];  Service: Gastroenterology;  Laterality: N/A;   ESOPHAGOGASTRODUODENOSCOPY (EGD) WITH PROPOFOL N/A 07/10/2021   Procedure: ESOPHAGOGASTRODUODENOSCOPY (EGD) WITH PROPOFOL;  Surgeon: Lavena Bullion, DO;  Location: [place];  Service: Gastroenterology;  Laterality: N/A;   EYE SURGERY Right 06/02/2019   Cataract removed   FRACTURE SURGERY     INSERTION OF DIALYSIS CATHETER Right 02/02/2021   Procedure: INSERTION OF Right Internal jugular TUNNELED  DIALYSIS CATHETER;  Surgeon: Elam Dutch, MD;  Location: [facility];  Service: Vascular;  Laterality: Right;   IR DIALY SHUNT INTRO NEEDLE/INTRACATH INITIAL W/IMG LEFT Left 09/12/2020   IR FLUORO GUIDE CV LINE RIGHT  05/25/2017   IR FLUORO GUIDE CV LINE RIGHT  07/17/2021   IR THORACENTESIS ASP PLEURAL SPACE W/IMG GUIDE  09/11/2021   IR US GUIDE VASC ACCESS LEFT  09/12/2020   IR US GUIDE VASC ACCESS RIGHT  05/25/2017   IR US GUIDE VASC  ACCESS RIGHT  07/17/2021   LIGATION OF ARTERIOVENOUS  FISTULA Left 01/10/2021   Procedure: LIGATION OF LEFT ARM RADIOCEPHALIC FISTULA;  Surgeon: Serafina Mitchell, MD;  Location: [facility];  Service: Vascular;  Laterality: Left;   PERIPHERAL VASCULAR BALLOON ANGIOPLASTY Left 01/15/2021   Procedure: PERIPHERAL VASCULAR BALLOON ANGIOPLASTY;  Surgeon: Serafina Mitchell, MD;  Location: [place] [facility];  Service: Cardiovascular;  Laterality: Left;  AVF   REVISON OF ARTERIOVENOUS FISTULA Left 07/18/2019   Procedure: REVISION PLICATION OF RADIOCEPHALIC ARTERIOVENOUS FISTULA LEFT ARM;  Surgeon: Angelia Mould, MD;  Location: [facility];  Service: Vascular;  Laterality: Left;   REVISON OF ARTERIOVENOUS FISTULA Left 01/10/2021   Procedure: CONVERSION TO BRACHIOCEPHALIC ARTERIOVENOUS FISTULA;  Surgeon: Serafina Mitchell, MD;  Location: [facility];  Service: Vascular;  Laterality: Left;   RINOPLASTY     Family History  Problem Relation Age of Onset   Cancer Mother    Social History:  reports that he has never smoked. He has never used smokeless tobacco. He reports that he does not currently use alcohol. He reports that he does not use drugs. Allergies  Allergen Reactions   Vancomycin Shortness Of Breath and Itching   Prior to Admission medications   Medication Sig Start  Date End Date Taking? Authorizing Provider  albuterol (VENTOLIN HFA) 108 (90 Base) MCG/ACT inhaler Inhale 2 puffs into the lungs 2 (two) times daily as needed for shortness of breath or wheezing. 06/01/21  Yes Oswald Hillock, MD  apixaban (ELIQUIS) 2.5 MG TABS tablet Take 1 tablet (2.5 mg total) by mouth 2 (two) times daily. 09/13/21 10/13/21 Yes Elgergawy, Silver Huguenin, MD  diltiazem (CARDIZEM CD) 240 MG 24 hr capsule Take 1 capsule (240 mg total) by mouth daily. 09/13/21  Yes Elgergawy, Silver Huguenin, MD  feeding supplement (ENSURE ENLIVE / ENSURE PLUS) LIQD Take 237 mLs by mouth 2 (two) times daily between meals. 09/13/21  Yes Elgergawy, Silver Huguenin, MD   fluticasone furoate-vilanterol (BREO ELLIPTA) 100-25 MCG/ACT AEPB Inhale 1 puff into the lungs daily as needed (For shortness of breath).   Yes [provider]  midodrine (PROAMATINE) 10 MG tablet Take 1 tablet (10 mg total) by mouth 3 (three) times daily with meals. 09/13/21  Yes Elgergawy, Silver Huguenin, MD  multivitamin (RENA-VIT) TABS tablet Take 1 tablet by mouth at bedtime. 01/12/18  Yes [provider]  sevelamer carbonate (RENVELA) 800 MG tablet Take 800 mg by mouth 3 (three) times daily with meals. 05/31/21  Yes [provider]  cinacalcet (SENSIPAR) 60 MG tablet Take 1 tablet (60 mg total) by mouth every evening. Patient not taking: Reported on 09/19/2021 06/01/21   Oswald Hillock, MD   Current Facility-Administered Medications  Medication Dose Route Frequency Provider Last Rate Last Admin   0.9 %  sodium chloride infusion  100 mL Intravenous PRN Ghimire, Henreitta Leber, MD       0.9 %  sodium chloride infusion  100 mL Intravenous PRN Ghimire, Henreitta Leber, MD       0.9 %  sodium chloride infusion  250 mL Intravenous PRN Ghimire, Henreitta Leber, MD       0.9 %  sodium chloride infusion  100 mL Intravenous PRN Tobie Poet E, NP       0.9 %  sodium chloride infusion  100 mL Intravenous PRN Adelfa Koh, NP       acetaminophen (TYLENOL) tablet 650 mg  650 mg Oral Q6H PRN Jonetta Osgood, MD   650 mg at 09/19/21 2215   Or   acetaminophen (TYLENOL) suppository 650 mg  650 mg Rectal Q6H PRN Jonetta Osgood, MD       albuterol (PROVENTIL) (2.5 MG/3ML) 0.083% nebulizer solution 2.5 mg  2.5 mg Inhalation BID PRN Jonetta Osgood, MD       alteplase (CATHFLO ACTIVASE) injection 2 mg  2 mg Intracatheter Once PRN Jonetta Osgood, MD       apixaban Arne [place]) tablet 2.5 mg  2.5 mg Oral BID Jonetta Osgood, MD   2.5 mg at 09/20/21 1700   Chlorhexidine Gluconate Cloth 2 % PADS 6 each  6 each Topical Q0600 Jonetta Osgood, MD       Derrill Memo ON 09/21/2021] diltiazem  (CARDIZEM CD) 24 hr capsule 300 mg  300 mg Oral Daily Ghimire, Shanker M, MD       fluticasone furoate-vilanterol (BREO ELLIPTA) 100-25 MCG/ACT 1 puff  1 puff Inhalation Daily Jonetta Osgood, MD   1 puff at 09/19/21 2215   heparin injection 1,000 Units  1,000 Units Dialysis PRN Jonetta Osgood, MD       heparin injection 1,000 Units  20 Units/kg Dialysis PRN Jonetta Osgood, MD       lidocaine (PF) (  XYLOCAINE) 1 % injection 5 mL  5 mL Intradermal PRN Ghimire, Henreitta Leber, MD       lidocaine-prilocaine (EMLA) cream 1 application  1 application Topical PRN Ghimire, Henreitta Leber, MD       metoprolol tartrate (LOPRESSOR) injection 2.5 mg  2.5 mg Intravenous Q4H PRN Jonetta Osgood, MD       midodrine (PROAMATINE) tablet 10 mg  10 mg Oral TID WC Jonetta Osgood, MD   10 mg at 09/20/21 1130   multivitamin (RENA-VIT) tablet 1 tablet  1 tablet Oral QHS Jonetta Osgood, MD   1 tablet at 09/19/21 2216   oxyCODONE (Oxy IR/ROXICODONE) immediate release tablet 5 mg  5 mg Oral Q6H PRN Jonetta Osgood, MD   5 mg at 09/20/21 1130   pentafluoroprop-tetrafluoroeth (GEBAUERS) aerosol 1 application  1 application Topical PRN Jonetta Osgood, MD       sodium chloride flush (NS) 0.9 % injection 3 mL  3 mL Intravenous Q12H Jonetta Osgood, MD   3 mL at 09/20/21 0909   sodium chloride flush (NS) 0.9 % injection 3 mL  3 mL Intravenous PRN Jonetta Osgood, MD       Facility-Administered Medications Ordered in Other Encounters  Medication Dose Route Frequency Provider Last Rate Last Admin   EPINEPHrine (ADRENALIN) 1 MG/10ML injection   Intravenous Anesthesia Intra-op Silas Flood C, CRNA   1 mg at 07/19/21 1018   fat emulsion 20 % bolus-local anes induced cardiac arrest - 22ml/kg over 1 min; MR q3 min for 3 doses total   Intravenous Anesthesia Intra-op Rande Brunt, CRNA   250 mL at 07/19/21 1030   flumazenil (ROMAZICON) injection   Intravenous Anesthesia Intra-op Nolon Nations, MD   0.5 mg  at 07/19/21 1013   lidocaine-EPINEPHrine (XYLOCAINE W/EPI) 2 %-1:100000 (with pres) injection   Peri-NEURAL Anesthesia Intra-op Nolon Nations, MD   20 mL at 07/19/21 0934   naloxone (NARCAN) injection   Intravenous Anesthesia Intra-op Nolon Nations, MD   40 mcg at 07/19/21 1005   phenylephrine (NEO-SYNEPHRINE) 20mg /NS 210mL premix infusion   Intravenous Continuous PRN Silas Flood C, CRNA   Stopped at 07/19/21 1150   propofol (DIPRIVAN) 500 MG/50ML infusion   Intravenous Continuous PRN Amadeo Garnet, CRNA   Stopped at 07/19/21 1129   Labs: Basic Metabolic Panel: Recent Labs  Lab 09/19/21 1443 09/19/21 1509 09/19/21 1950 09/20/21 0813  NA 142 137 139 137  K 4.9 5.5* 4.9 4.4  CL 96*  --  98 99  CO2 26  --  26 26  GLUCOSE 107*  --  90 84  BUN 27*  --  28* 16  CREATININE 4.95*  --  5.02* 3.63*  CALCIUM 9.2  --  8.7* 8.3*  PHOS  --   --  3.9 3.3   Liver Function Tests: Recent Labs  Lab 09/19/21 1443 09/19/21 1950 09/20/21 0813  AST 19  --   --   ALT 10  --   --   ALKPHOS 120  --   --   BILITOT 1.0  --   --   PROT 6.8  --   --   ALBUMIN 2.8* 2.9* 2.9*   No results for input(s): LIPASE, AMYLASE in the last 168 hours. No results for input(s): AMMONIA in the last 168 hours. CBC: Recent Labs  Lab 09/19/21 1443 09/19/21 1509 09/20/21 0813  WBC 6.1  --  4.9  NEUTROABS 4.7  --   --  HGB 10.4* 11.2* 10.9*  HCT 34.8* 33.0* 34.7*  MCV 104.2*  --  102.4*  PLT 215  --  192   Cardiac Enzymes: No results for input(s): CKTOTAL, CKMB, CKMBINDEX, TROPONINI in the last 168 hours. CBG: No results for input(s): GLUCAP in the last 168 hours. Iron Studies: No results for input(s): IRON, TIBC, TRANSFERRIN, FERRITIN in the last 72 hours. Studies/Results: DG Chest Port 1 View  Result Date: 09/19/2021 CLINICAL DATA:  Concern for sepsis EXAM: PORTABLE CHEST 1 VIEW COMPARISON:  Chest x-ray dated September 11, 2021 FINDINGS: Cardiac and mediastinal contours are unchanged. Stable  position of right central venous line. Diffuse bilateral interstitial opacities. No focal consolidation. Small right-greater-than-left pleural effusions, right pleural effusion is increased in size. No evidence of pneumothorax. IMPRESSION: 1. Similar diffuse bilateral interstitial opacities, likely due to pulmonary edema. 2. Increased small right pleural effusion. Electronically Signed   By: Yetta Glassman M.D.   On: 09/19/2021 14:22    Physical Exam: Vitals:   09/20/21 1214 09/20/21 1445 09/20/21 1500 09/20/21 1656  BP: 134/87 126/77  110/78  Pulse: (!) 119 (!) 112 (!) 106 (!) 103  Resp: (!) 26 (!) 23 (!) 21 17  Temp:  (!) 97.5 F (36.4 C)  98.3 F (36.8 C)  TempSrc:  Oral  Oral  SpO2: 98% 92%  97%     General: WDWN NAD Head: sclera not icteric Lungs: Noted fine rales LLL; No wheeze, or rhonchi. Heart: RRR. No murmur, rubs or gallops.  Abdomen: soft, nontender, +BS, no guarding Lower extremities: no edema, ischemic changes, or open wounds  Neuro: AAOx3. Moves all extremities spontaneously. Dialysis Access: TDC and R AVF; L AVF (non-functioning)  Dialysis Orders:  MWF - [place] ([place]) 3hrs, BFR 400, DFR 800,  EDW 50.5kg, 3K/ 2.5Ca Micera 148mcg IV Q2wks-last dose: 09/18/21 Venofer 100mg  IV X 3 doses (new order written-not given yet) Heparin: No bolus  Assessment/Plan: Acute on chronic hypoxic respiratory failure-volume overload d/t ongoing non-compliance with HD. Received HD overnight-tolerated with net UF 3.5L. On O2 chronically-now on 3L. Noted tachycardia today. Plan for HD 09/21/21 in AM. ESRD - on HD MWF. Currently off schedule. Plan for HD tomorrow in AM. If patient discharged afterwards, can resume HD on Monday 1/23 per his usual schedule. Hypertension/volume  - Bps soft but stable. Continue Midodrine. Euvolemic on exam. Anemia of CKD - Hgb 10.9. ESA recently given on 09/18/21. Will resume in outpatient Secondary Hyperparathyroidism - Ca and PO4 okay.  Resume binder regimen in outpatient. Nutrition - Renal diet with fluid restriction  Reesa Chew   I have personally seen and examined this patient and agree with the assessment/plan as previously written by Tobie Poet. Shaune Pollack Leanora Murin,MD 09/20/2021 5:10 PM  09/20/2021, 5:09 PM

## 2021-09-20 NOTE — Progress Notes (Signed)
Overnight progress note  Informed by RN that patient wants to leave [facility] because he does not have a bed assigned yet.  I spoke to the patient and tried my very best to convince him to stay as he will need serial hemodialysis for volume overload/pulmonary edema for which he is currently requiring 3.5 L supplemental oxygen to maintain sats in the 90s.  I have explained to him that without hemodialysis/treatment of his current medical condition, his oxygen saturation can drop dangerously and can even result in death.  I have explained to him that leaving the hospital is [place].  Patient is currently AAO x4 and appears to have full decision-making capacity.  I have also spoken to bed placement and been informed that unfortunately there are no beds available at this time.  I have requested bed placement to assign a bed for this patient as soon as one becomes available.

## 2021-09-20 NOTE — Progress Notes (Signed)
PROGRESS NOTE        PATIENT DETAILS Name: Thomas Robinson Age: 61 y.o. Sex: male Date of Birth: 02-05-1961 Admit Date: 09/19/2021 Admitting Physician Evalee Mutton Kristeen Mans, MD YTK:[id number], [place], [place]  Brief Narrative: Patient is a 61 y.o. male with history of ESRD on HD MWF-noncompliance-presenting to the hospital with shortness of breath due to volume overload.  See below for further details.  Subjective: Feels much better-had hemodialysis done yesterday.  Still tachycardic-not on 3-4 L of oxygen.  Plans are for another round of inpatient hemodialysis schedule permitting.  Objective: Vitals: Blood pressure 106/71, pulse (!) 121, temperature 98.8 F (37.1 C), temperature source Oral, resp. rate (!) 23, SpO2 93 %.   Exam: Gen Exam:Alert awake-not in any distress HEENT:atraumatic, normocephalic Chest: B/L clear to auscultation anteriorly CVS:S1S2 regular Abdomen:soft non tender, non distended Extremities:no edema Neurology: Non focal Skin: no rash  Pertinent Labs/Radiology: CBC Latest Ref Rng & Units 09/20/2021 09/19/2021 09/19/2021  WBC 4.0 - 10.5 K/uL 4.9 - 6.1  Hemoglobin 13.0 - 17.0 g/dL 10.9(L) 11.2(L) 10.4(L)  Hematocrit 39.0 - 52.0 % 34.7(L) 33.0(L) 34.8(L)  Platelets 150 - 400 K/uL 192 - 215    Lab Results  Component Value Date   NA 137 09/20/2021   K 4.4 09/20/2021   CL 99 09/20/2021   CO2 26 09/20/2021      Assessment/Plan: * Acute on chronic respiratory failure with hypoxemia due to volume overload due to noncompliance with hemodialysis - (present on admission) Feels significantly better after HD last night-although better-he is not yet at baseline-still requiring around 3 L of oxygen-still somewhat tachycardic.  Discussed with nephrology-planning on another round of inpatient dialysis before consideration of discharge.  He is chronically on 2.5 L of oxygen at home-however suspect he is noncompliant.  Recurrent pleural effusion on  right- (present on admission) Reviewed most recent discharge summary-prior thoracocentesis consistent with a transudate.  I suspect this is due to noncompliance with hemodialysis.  ESRD on dialysis on HD MWF-but noncompliant Discussed with nephrology-he apparently has been cutting short his outpatient hemodialysis.  Longstanding history of noncompliance.  Hypoxia/shortness of breath much better after HD last night-plans are to do another round of inpatient hemodialysis later today/tomorrow schedule permitting-and resume outpatient hemodialysis on Monday, Wednesdays and Fridays.  Non-compliance with renal dialysis ([place]) Continue to counsel.  Anemia in ESRD (end-stage renal disease) ([place])- (present on admission) Will defer Aranesp/iron to nephrology service.  Essential hypertension- (present on admission) BP stable-on midodrine-reviewed prior notes during his most recent hospitalization-BP has been soft.  Suspect patient is on Cardizem for rate control.  We will continue both of these agents and follow closely.  Persistent atrial fibrillation ([place])- (present on admission) Heart rate slightly elevated in the 120s-appears comfortable-increase long-acting Cardizem to 300 mg daily-continue Eliquis.  Hopeful that with continued hemodialysis and medication adjustment-heart rate will stabilize.  Chronic diastolic (congestive) heart failure ([place])- (present on admission) Volume status much better-see above.   Prolonged QT interval- (present on admission) Avoid QT prolonging agents-check magnesium with morning labs-watch closely in telemetry.  Appears to be present on prior EKGs as well.  Atypical chest pain- (present on admission) Improved-hardly any chest pain this morning-suspect atypical chest pain in the setting of volume overload.  Troponins minimally elevated and flat-likely insignificant.    Pulmonary emboli ([name])- (present on admission) Small PE seen on recent  CT chest on 08/26/2021-continue  Eliquis.  Unclear how compliant he has been.  Cachexia ([place])- (present on admission) Apparently have significant weight loss over the past several months-this is unintentional.  Recent TSH without signs of hyperthyroidism.  Recent HIV test was negative.  CT chest did not show any malignancy-cytology from right pleural fluid x2 late last year was negative for malignancy.  Have advised to follow-up with his PCP for further work-up to be done in the outpatient setting.  Suspect it may be due to ESRD-and noncompliance with dialysis.  BMI: Estimated body mass index is 16.66 kg/m as calculated from the following:   Height as of 09/08/21: 5\' 8"  (1.727 m).   Weight as of 09/11/21: 49.7 kg.    DVT Prophylaxis: Eliquis Procedures: None Consults: Nephrology Code Status:Full code  Family Communication: None at bedside   Disposition Plan: Status is: Observation  The patient will require care spanning > 2 midnights and should be moved to inpatient because: Improving volume overload due to noncompliance with dialysis-nephrology planning another session of inpatient dialysis before discharge.   Diet: Diet Order             Diet renal/carb modified with fluid restriction Diet-HS Snack? Nothing; Fluid restriction: 1200 mL Fluid; Room service appropriate? Yes; Fluid consistency: Thin  Diet effective now                     Antimicrobial agents: Anti-infectives (From admission, onward)    None        MEDICATIONS: Scheduled Meds:  apixaban  2.5 mg Oral BID   Chlorhexidine Gluconate Cloth  6 each Topical Q0600   diltiazem  240 mg Oral Daily   fluticasone furoate-vilanterol  1 puff Inhalation Daily   midodrine  10 mg Oral TID WC   multivitamin  1 tablet Oral QHS   sodium chloride flush  3 mL Intravenous Q12H   Continuous Infusions:  sodium chloride     sodium chloride     sodium chloride     PRN Meds:.sodium chloride, sodium chloride, sodium chloride, acetaminophen **OR**  acetaminophen, albuterol, alteplase, heparin, heparin, lidocaine (PF), lidocaine-prilocaine, pentafluoroprop-tetrafluoroeth, sodium chloride flush   I have personally reviewed following labs and imaging studies  LABORATORY DATA: CBC: Recent Labs  Lab 09/19/21 1443 09/19/21 1509 09/20/21 0813  WBC 6.1  --  4.9  NEUTROABS 4.7  --   --   HGB 10.4* 11.2* 10.9*  HCT 34.8* 33.0* 34.7*  MCV 104.2*  --  102.4*  PLT 215  --  409    Basic Metabolic Panel: Recent Labs  Lab 09/19/21 1443 09/19/21 1509 09/19/21 1950 09/20/21 0813  NA 142 137 139 137  K 4.9 5.5* 4.9 4.4  CL 96*  --  98 99  CO2 26  --  26 26  GLUCOSE 107*  --  90 84  BUN 27*  --  28* 16  CREATININE 4.95*  --  5.02* 3.63*  CALCIUM 9.2  --  8.7* 8.3*  MG  --   --   --  2.2  PHOS  --   --  3.9 3.3    GFR: Estimated Creatinine Clearance: 14.8 mL/min (A) (by C-G formula based on SCr of 3.63 mg/dL (H)).  Liver Function Tests: Recent Labs  Lab 09/19/21 1443 09/19/21 1950 09/20/21 0813  AST 19  --   --   ALT 10  --   --   ALKPHOS 120  --   --   BILITOT  1.0  --   --   PROT 6.8  --   --   ALBUMIN 2.8* 2.9* 2.9*   No results for input(s): LIPASE, AMYLASE in the last 168 hours. No results for input(s): AMMONIA in the last 168 hours.  Coagulation Profile: Recent Labs  Lab 09/20/21 0813  INR 1.3*    Cardiac Enzymes: No results for input(s): CKTOTAL, CKMB, CKMBINDEX, TROPONINI in the last 168 hours.  BNP (last 3 results) No results for input(s): PROBNP in the last 8760 hours.  Lipid Profile: No results for input(s): CHOL, HDL, LDLCALC, TRIG, CHOLHDL, LDLDIRECT in the last 72 hours.  Thyroid Function Tests: No results for input(s): TSH, T4TOTAL, FREET4, T3FREE, THYROIDAB in the last 72 hours.  Anemia Panel: No results for input(s): VITAMINB12, FOLATE, FERRITIN, TIBC, IRON, RETICCTPCT in the last 72 hours.  Urine analysis:    Component Value Date/Time   COLORURINE YELLOW 05/24/2017 1729    APPEARANCEUR HAZY (A) 05/24/2017 1729   LABSPEC 1.011 05/24/2017 1729   PHURINE 5.0 05/24/2017 1729   GLUCOSEU 50 (A) 05/24/2017 1729   HGBUR SMALL (A) 05/24/2017 1729   BILIRUBINUR NEGATIVE 05/24/2017 1729   KETONESUR NEGATIVE 05/24/2017 1729   PROTEINUR 100 (A) 05/24/2017 1729   UROBILINOGEN 0.2 04/09/2015 1527   NITRITE NEGATIVE 05/24/2017 1729   LEUKOCYTESUR TRACE (A) 05/24/2017 1729    Sepsis Labs: Lactic Acid, Venous    Component Value Date/Time   LATICACIDVEN 1.5 09/19/2021 1950    MICROBIOLOGY: Recent Results (from the past 240 hour(s))  Culture, body fluid w Gram Stain-bottle     Status: None   Collection Time: 09/11/21  1:16 PM   Specimen: Fluid  Result Value Ref Range Status   Specimen Description FLUID RIGHT PLEURAL  Final   Special Requests BOTTLES DRAWN AEROBIC AND ANAEROBIC 10CC  Final   Culture   Final    NO GROWTH 5 DAYS Performed at [facility], [place] [address], [place], [place] [address]    Report Status 09/16/2021 FINAL  Final  Gram stain     Status: None   Collection Time: 09/11/21  1:16 PM   Specimen: Fluid  Result Value Ref Range Status   Specimen Description FLUID RIGHT PLEURAL  Final   Special Requests NONE  Final   Gram Stain   Final    RARE WBC PRESENT, PREDOMINANTLY MONONUCLEAR NO ORGANISMS SEEN Performed at [facility], [place] [address] [place], [place] [address]    Report Status 09/11/2021 FINAL  Final  Blood Culture (routine x 2)     Status: None (Preliminary result)   Collection Time: 09/19/21  2:43 PM   Specimen: BLOOD  Result Value Ref Range Status   Specimen Description BLOOD BLOOD LEFT FOREARM  Final   Special Requests   Final    BOTTLES DRAWN AEROBIC AND ANAEROBIC Blood Culture adequate volume   Culture   Final    NO GROWTH < 24 HOURS Performed at [facility], [place] [address], [place], [place] [address]    Report Status PENDING  Incomplete  Resp Panel by RT-PCR (Flu A&B, Covid) Nasopharyngeal Swab      Status: None   Collection Time: 09/19/21  7:31 PM   Specimen: Nasopharyngeal Swab; Nasopharyngeal(NP) swabs in vial transport medium  Result Value Ref Range Status   SARS Coronavirus 2 by RT PCR NEGATIVE NEGATIVE Final    Comment: (NOTE) SARS-CoV-2 target nucleic acids are NOT DETECTED.  The SARS-CoV-2 RNA is generally detectable in  upper respiratory specimens during the acute phase of infection. The lowest concentration of SARS-CoV-2 viral copies this assay can detect is 138 copies/mL. A negative result does not preclude SARS-Cov-2 infection and should not be used as the sole basis for treatment or other patient management decisions. A negative result may occur with  improper specimen collection/handling, submission of specimen other than nasopharyngeal swab, presence of viral mutation(s) within the areas targeted by this assay, and inadequate number of viral copies(<138 copies/mL). A negative result must be combined with clinical observations, patient history, and epidemiological information. The expected result is Negative.  Fact Sheet for Patients:  EntrepreneurPulse.com.au  Fact Sheet for Healthcare Providers:  IncredibleEmployment.be  This test is no t yet approved or cleared by the [place] FDA and  has been authorized for detection and/or diagnosis of SARS-CoV-2 by FDA under an Emergency Use Authorization (EUA). This EUA will remain  in effect (meaning this test can be used) for the duration of the COVID-19 declaration under Section 564(b)(1) of the Act, 21 U.S.C.section 360bbb-3(b)(1), unless the authorization is terminated  or revoked sooner.       Influenza A by PCR NEGATIVE NEGATIVE Final   Influenza B by PCR NEGATIVE NEGATIVE Final    Comment: (NOTE) The Xpert Xpress SARS-CoV-2/FLU/RSV plus assay is intended as an aid in the diagnosis of influenza from Nasopharyngeal swab specimens and should not be used as a sole basis  for treatment. Nasal washings and aspirates are unacceptable for Xpert Xpress SARS-CoV-2/FLU/RSV testing.  Fact Sheet for Patients: EntrepreneurPulse.com.au  Fact Sheet for Healthcare Providers: IncredibleEmployment.be  This test is not yet approved or cleared by the [place] FDA and has been authorized for detection and/or diagnosis of SARS-CoV-2 by FDA under an Emergency Use Authorization (EUA). This EUA will remain in effect (meaning this test can be used) for the duration of the COVID-19 declaration under Section 564(b)(1) of the Act, 21 U.S.C. section 360bbb-3(b)(1), unless the authorization is terminated or revoked.  Performed at [facility], [place] [address], [place], [place] [address]     RADIOLOGY STUDIES/RESULTS: DG Chest Port 1 View  Result Date: 09/19/2021 CLINICAL DATA:  Concern for sepsis EXAM: PORTABLE CHEST 1 VIEW COMPARISON:  Chest x-ray dated September 11, 2021 FINDINGS: Cardiac and mediastinal contours are unchanged. Stable position of right central venous line. Diffuse bilateral interstitial opacities. No focal consolidation. Small right-greater-than-left pleural effusions, right pleural effusion is increased in size. No evidence of pneumothorax. IMPRESSION: 1. Similar diffuse bilateral interstitial opacities, likely due to pulmonary edema. 2. Increased small right pleural effusion. Electronically Signed   By: Yetta Glassman M.D.   On: 09/19/2021 14:22     LOS: 0 days   Oren Binet, MD  [facility]    To contact the attending provider between 7A-7P or the covering provider during after hours 7P-7A, please log into the web site [email or web address] and access using universal  password for that web site. If you do not have the password, please call the hospital operator.  09/20/2021, 11:14 AM

## 2021-09-20 NOTE — Consult Note (Deleted)
[facility] Renal Consultation Note    Indication for Consultation:  Management of ESRD/hemodialysis; anemia, hypertension/volume and secondary hyperparathyroidism  KDX:IPJASN, Desiree, PA  HPI: Thomas Robinson is a 61 y.o. male with ESRD on HD MWF at [facility]. He has a past medical history significant for  HTN, HLD, CVA, and noncompliance with hemodialysis, medications, and follow-up visits. Patient presents to the ED c/o SOB d/t volume overload. Noted previous hospitalization 09/07/21-09/13/21 for the same issue. Our service consulted for hemodialysis management.  Unfortunately, patient continues to be non-compliant with hemodialysis despite ongoing discussions from our team on the importance of staying on HD for full treatment. Last HD on 09/18/21 and noted patient ran for only 2 hours. Seen and examined patient at bedside in ED. Currently not in respiratory distress and on 3L [place]. Noted HR ranging in 110-120s. Currently denies SOB, CP, and N/V. He received HD overnight and tolerated net UF 3.5L. Notable labs include: K+ 4.4, BUN 16, Ca 8.3, PO4 3.3, Albumin 2.9, and Hgb 10.9. CXR shows diffuse BL opacities consistent with pulmonary edema and increased small right pleural effusion. Plan for HD tomorrow 09/21/21.   Past Medical History:  Diagnosis Date   Anaphylactic shock, unspecified, sequela 06/10/2019   Aortic insufficiency    Aortic stenosis    Arthritis    Asthma, chronic, unspecified asthma severity, with acute exacerbation 10/23/2017   CAP (community acquired pneumonia) 10/23/2017   Carpal tunnel syndrome of right wrist 10/05/2018   Cataract    right - removed by surgery   Cerebral infarction due to thrombosis of cerebral artery (HCC)    Cervical disc herniation 01/04/2020   Chronic diastolic heart failure ([place]) 04/13/2018   Chronic low back pain 11/24/2019   Constipation    COPD (chronic obstructive pulmonary disease) (HCC)    Cough    chronic cough    Encounter for immunization 07/08/2017   ESRD on hemodialysis ([place]) 02/16/2018   Fall 06/04/2019   GERD (gastroesophageal reflux disease) 06/04/2019   GI bleeding 05/24/2017   Gram-negative sepsis, unspecified ([place]) 09/05/2017   History of fusion of cervical spine 03/26/2020   Hyperlipidemia    Hypertension    Hypokalemia 06/04/2017   Hypotension    Iron deficiency anemia, unspecified 09/09/2017   Left shoulder pain 11/11/2019   Lumbar radiculopathy 11/24/2019   Lung contusion 06/04/2019   LVH (left ventricular hypertrophy) due to hypertensive disease, with heart failure ([place]) 06/18/2020   Macrocytic anemia 05/24/2017   Mitral regurgitation    Moderate protein-calorie malnutrition ([place]) 06/06/2017   Myofascial pain syndrome 06/12/2020   Neuritis of right ulnar nerve 09/13/2018   Non-compliance with renal dialysis ([place]) 02/08/2020   Oxygen deficiency 12/30/2019   O2 sats on RA 87% at PAT appt    Pain in joint of right elbow 09/13/2018   Paroxysmal atrial flutter (HCC)    PEA (Pulseless electrical activity) ([place])    after sedation in 07/2021   Pulmonary hypertension ([place])    Renovascular hypertension 06/22/2020   Restless leg syndrome    Rib fractures 06/2019   Right   S/P cardiac cath 08/27/2020   normal coronary arteries.   Secondary hyperparathyroidism of renal origin ([place]) 06/04/2017   Thoracic ascending aortic aneurysm    Tricuspid regurgitation    Ulnar neuropathy 10/05/2018   Volume overload 10/23/2017   Past Surgical History:  Procedure Laterality Date   A/V FISTULAGRAM N/A 01/01/2021   Procedure: A/V FISTULAGRAM;  Surgeon: Serafina Mitchell, MD;  Location: [facility]  INVASIVE CV LAB;  Service: Cardiovascular;  Laterality: N/A;   A/V FISTULAGRAM N/A 01/15/2021   Procedure: A/V [id number];  Surgeon: Serafina Mitchell, MD;  Location: [place] CV LAB;  Service: Cardiovascular;  Laterality: N/A;   ANTERIOR CERVICAL DECOMP/DISCECTOMY FUSION N/A 01/04/2020   Procedure: ANTERIOR  CERVICAL DECOMPRESSION/DISCECTOMY FUSION CERVICAL FIVE THROUGH SEVEN;  Surgeon: Melina Schools, MD;  Location: [place];  Service: Orthopedics;  Laterality: N/A;  3 hrs   AV FISTULA PLACEMENT Left 05/28/2017   Procedure: LEFT ARM ARTERIOVENOUS (AV) FISTULA CREATION;  Surgeon: Conrad Estelle, MD;  Location: [place];  Service: Vascular;  Laterality: Left;   AV FISTULA PLACEMENT Left 02/02/2021   Procedure: Debride left forearm, ligation of left upper arm Aretiovenous fistula;  Surgeon: Elam Dutch, MD;  Location: [place];  Service: Vascular;  Laterality: Left;   AV FISTULA PLACEMENT Right 04/04/2021   Procedure: RIGHT ARTERIOVENOUS (AV) FISTULA CREATION;  Surgeon: Serafina Mitchell, MD;  Location: [place] OR;  Service: Vascular;  Laterality: Right;   BIOPSY  07/10/2021   Procedure: BIOPSY;  Surgeon: Lavena Bullion, DO;  Location: [facility];  Service: Gastroenterology;;   COLONOSCOPY WITH PROPOFOL N/A 07/10/2021   Procedure: COLONOSCOPY WITH PROPOFOL;  Surgeon: Lavena Bullion, DO;  Location: [place];  Service: Gastroenterology;  Laterality: N/A;   ESOPHAGOGASTRODUODENOSCOPY (EGD) WITH PROPOFOL N/A 07/10/2021   Procedure: ESOPHAGOGASTRODUODENOSCOPY (EGD) WITH PROPOFOL;  Surgeon: Lavena Bullion, DO;  Location: [place];  Service: Gastroenterology;  Laterality: N/A;   EYE SURGERY Right 06/02/2019   Cataract removed   FRACTURE SURGERY     INSERTION OF DIALYSIS CATHETER Right 02/02/2021   Procedure: INSERTION OF Right Internal jugular TUNNELED  DIALYSIS CATHETER;  Surgeon: Elam Dutch, MD;  [facility];  Service: Vascular;  Laterality: Right;   IR DIALY SHUNT INTRO NEEDLE/INTRACATH INITIAL W/IMG LEFT Left 09/12/2020   IR FLUORO GUIDE CV LINE RIGHT  05/25/2017   IR FLUORO GUIDE CV LINE RIGHT  07/17/2021   IR THORACENTESIS ASP PLEURAL SPACE W/IMG GUIDE  09/11/2021   IR US GUIDE VASC ACCESS LEFT  09/12/2020   IR US GUIDE VASC ACCESS RIGHT  05/25/2017   IR US GUIDE VASC ACCESS RIGHT   07/17/2021   LIGATION OF ARTERIOVENOUS  FISTULA Left 01/10/2021   Procedure: LIGATION OF LEFT ARM RADIOCEPHALIC FISTULA;  Surgeon: Serafina Mitchell, MD;  Location[facility];  Service: Vascular;  Laterality: Left;   PERIPHERAL VASCULAR BALLOON ANGIOPLASTY Left 01/15/2021   Procedure: PERIPHERAL VASCULAR BALLOON ANGIOPLASTY;  Surgeon: Serafina Mitchell, MD;  Location: [place] CV LAB;  Service: Cardiovascular;  Laterality: Left;  AVF   REVISON OF ARTERIOVENOUS FISTULA Left 07/18/2019   Procedure: REVISION PLICATION OF RADIOCEPHALIC ARTERIOVENOUS FISTULA LEFT ARM;  Surgeon: Angelia Mould, MD;  Location: [facility] OR;  Service: Vascular;  Laterality: Left;   REVISON OF ARTERIOVENOUS FISTULA Left 01/10/2021   Procedure: CONVERSION TO BRACHIOCEPHALIC ARTERIOVENOUS FISTULA;  Surgeon: Serafina Mitchell, MD;  [facility];  Service: Vascular;  Laterality: Left;   RINOPLASTY     Family History  Problem Relation Age of Onset   Cancer Mother    Social History:  reports that he has never smoked. He has never used smokeless tobacco. He reports that he does not currently use alcohol. He reports that he does not use drugs. Allergies  Allergen Reactions   Vancomycin Shortness Of Breath and Itching   Prior to Admission medications   Medication Sig Start Date End Date Taking?  Authorizing Provider  albuterol (VENTOLIN HFA) 108 (90 Base) MCG/ACT inhaler Inhale 2 puffs into the lungs 2 (two) times daily as needed for shortness of breath or wheezing. 06/01/21  Yes Oswald Hillock, MD  apixaban (ELIQUIS) 2.5 MG TABS tablet Take 1 tablet (2.5 mg total) by mouth 2 (two) times daily. 09/13/21 10/13/21 Yes Elgergawy, Silver Huguenin, MD  diltiazem (CARDIZEM CD) 240 MG 24 hr capsule Take 1 capsule (240 mg total) by mouth daily. 09/13/21  Yes Elgergawy, Silver Huguenin, MD  feeding supplement (ENSURE ENLIVE / ENSURE PLUS) LIQD Take 237 mLs by mouth 2 (two) times daily between meals. 09/13/21  Yes Elgergawy, Silver Huguenin, MD  fluticasone  furoate-vilanterol (BREO ELLIPTA) 100-25 MCG/ACT AEPB Inhale 1 puff into the lungs daily as needed (For shortness of breath).   Yes [provider]  midodrine (PROAMATINE) 10 MG tablet Take 1 tablet (10 mg total) by mouth 3 (three) times daily with meals. 09/13/21  Yes Elgergawy, Silver Huguenin, MD  multivitamin (RENA-VIT) TABS tablet Take 1 tablet by mouth at bedtime. 01/12/18  Yes [provider]  sevelamer carbonate (RENVELA) 800 MG tablet Take 800 mg by mouth 3 (three) times daily with meals. 05/31/21  Yes [provider]  cinacalcet (SENSIPAR) 60 MG tablet Take 1 tablet (60 mg total) by mouth every evening. Patient not taking: Reported on 09/19/2021 06/01/21   Oswald Hillock, MD   Current Facility-Administered Medications  Medication Dose Route Frequency Provider Last Rate Last Admin   0.9 %  sodium chloride infusion  100 mL Intravenous PRN Ghimire, Henreitta Leber, MD       0.9 %  sodium chloride infusion  100 mL Intravenous PRN Ghimire, Henreitta Leber, MD       0.9 %  sodium chloride infusion  250 mL Intravenous PRN Ghimire, Henreitta Leber, MD       acetaminophen (TYLENOL) tablet 650 mg  650 mg Oral Q6H PRN Jonetta Osgood, MD   650 mg at 09/19/21 2215   Or   acetaminophen (TYLENOL) suppository 650 mg  650 mg Rectal Q6H PRN Jonetta Osgood, MD       albuterol (PROVENTIL) (2.5 MG/3ML) 0.083% nebulizer solution 2.5 mg  2.5 mg Inhalation BID PRN Jonetta Osgood, MD       alteplase (CATHFLO ACTIVASE) injection 2 mg  2 mg Intracatheter Once PRN Jonetta Osgood, MD       apixaban Arne [place]) tablet 2.5 mg  2.5 mg Oral BID Jonetta Osgood, MD   2.5 mg at 09/20/21 7829   Chlorhexidine Gluconate Cloth 2 % PADS 6 each  6 each Topical Q0600 Jonetta Osgood, MD       Derrill Memo ON 09/21/2021] diltiazem (CARDIZEM CD) 24 hr capsule 300 mg  300 mg Oral Daily Ghimire, Shanker M, MD       fluticasone furoate-vilanterol (BREO ELLIPTA) 100-25 MCG/ACT 1 puff  1 puff Inhalation Daily Jonetta Osgood, MD   1 puff at 09/19/21 2215   heparin injection 1,000 Units  1,000 Units Dialysis PRN Jonetta Osgood, MD       heparin injection 1,000 Units  20 Units/kg Dialysis PRN Jonetta Osgood, MD       lidocaine (PF) (XYLOCAINE) 1 % injection 5 mL  5 mL Intradermal PRN Ghimire, Henreitta Leber, MD       lidocaine-prilocaine (EMLA) cream 1 application  1 application Topical PRN Ghimire, Henreitta Leber, MD       midodrine (PROAMATINE) tablet 10 mg  10 mg Oral TID WC Jonetta Osgood, MD   10 mg at 09/20/21 1130   multivitamin (RENA-VIT) tablet 1 tablet  1 tablet Oral QHS Jonetta Osgood, MD   1 tablet at 09/19/21 2216   oxyCODONE (Oxy IR/ROXICODONE) immediate release tablet 5 mg  5 mg Oral Q6H PRN Jonetta Osgood, MD   5 mg at 09/20/21 1130   pentafluoroprop-tetrafluoroeth (GEBAUERS) aerosol 1 application  1 application Topical PRN Ghimire, Henreitta Leber, MD       sodium chloride flush (NS) 0.9 % injection 3 mL  3 mL Intravenous Q12H Ghimire, Henreitta Leber, MD   3 mL at 09/20/21 0909   sodium chloride flush (NS) 0.9 % injection 3 mL  3 mL Intravenous PRN Jonetta Osgood, MD       Current Outpatient Medications  Medication Sig Dispense Refill   albuterol (VENTOLIN HFA) 108 (90 Base) MCG/ACT inhaler Inhale 2 puffs into the lungs 2 (two) times daily as needed for shortness of breath or wheezing. 1 each 1   apixaban (ELIQUIS) 2.5 MG TABS tablet Take 1 tablet (2.5 mg total) by mouth 2 (two) times daily. 60 tablet 0   diltiazem (CARDIZEM CD) 240 MG 24 hr capsule Take 1 capsule (240 mg total) by mouth daily. 30 capsule 0   feeding supplement (ENSURE ENLIVE / ENSURE PLUS) LIQD Take 237 mLs by mouth 2 (two) times daily between meals. 237 mL 12   fluticasone furoate-vilanterol (BREO ELLIPTA) 100-25 MCG/ACT AEPB Inhale 1 puff into the lungs daily as needed (For shortness of breath).     midodrine (PROAMATINE) 10 MG tablet Take 1 tablet (10 mg total) by mouth 3 (three) times daily with meals. 90 tablet 0    multivitamin (RENA-VIT) TABS tablet Take 1 tablet by mouth at bedtime.     sevelamer carbonate (RENVELA) 800 MG tablet Take 800 mg by mouth 3 (three) times daily with meals.     cinacalcet (SENSIPAR) 60 MG tablet Take 1 tablet (60 mg total) by mouth every evening. (Patient not taking: Reported on 09/19/2021) 30 tablet 1   Facility-Administered Medications Ordered in Other Encounters  Medication Dose Route Frequency Provider Last Rate Last Admin   EPINEPHrine (ADRENALIN) 1 MG/10ML injection   Intravenous Anesthesia Intra-op Helseth, Olivia C, CRNA   1 mg at 07/19/21 1018   fat emulsion 20 % bolus-local anes induced cardiac arrest - 86ml/kg over 1 min; MR q3 min for 3 doses total   Intravenous Anesthesia Intra-op Junie Bame B, CRNA   250 mL at 07/19/21 1030   flumazenil (ROMAZICON) injection   Intravenous Anesthesia Intra-op Nolon Nations, MD   0.5 mg at 07/19/21 1013   lidocaine-EPINEPHrine (XYLOCAINE W/EPI) 2 %-1:100000 (with pres) injection   Peri-NEURAL Anesthesia Intra-op Nolon Nations, MD   20 mL at 07/19/21 0934   naloxone (NARCAN) injection   Intravenous Anesthesia Intra-op Nolon Nations, MD   40 mcg at 07/19/21 1005   phenylephrine (NEO-SYNEPHRINE) 20mg /NS 270mL premix infusion   Intravenous Continuous PRN Amadeo Garnet, CRNA   Stopped at 07/19/21 1150   propofol (DIPRIVAN) 500 MG/50ML infusion   Intravenous Continuous PRN Amadeo Garnet, CRNA   Stopped at 07/19/21 1129   Labs: Basic Metabolic Panel: Recent Labs  Lab 09/19/21 1443 09/19/21 1509 09/19/21 1950 09/20/21 0813  NA 142 137 139 137  K 4.9 5.5* 4.9 4.4  CL 96*  --  98 99  CO2 26  --  26 26  GLUCOSE 107*  --  90 84  BUN 27*  --  28* 16  CREATININE 4.95*  --  5.02* 3.63*  CALCIUM 9.2  --  8.7* 8.3*  PHOS  --   --  3.9 3.3   Liver Function Tests: Recent Labs  Lab 09/19/21 1443 09/19/21 1950 09/20/21 0813  AST 19  --   --   ALT 10  --   --   ALKPHOS 120  --   --   BILITOT 1.0  --   --   PROT 6.8  --    --   ALBUMIN 2.8* 2.9* 2.9*   No results for input(s): LIPASE, AMYLASE in the last 168 hours. No results for input(s): AMMONIA in the last 168 hours. CBC: Recent Labs  Lab 09/19/21 1443 09/19/21 1509 09/20/21 0813  WBC 6.1  --  4.9  NEUTROABS 4.7  --   --   HGB 10.4* 11.2* 10.9*  HCT 34.8* 33.0* 34.7*  MCV 104.2*  --  102.4*  PLT 215  --  192   Cardiac Enzymes: No results for input(s): CKTOTAL, CKMB, CKMBINDEX, TROPONINI in the last 168 hours. CBG: No results for input(s): GLUCAP in the last 168 hours. Iron Studies: No results for input(s): IRON, TIBC, TRANSFERRIN, FERRITIN in the last 72 hours. Studies/Results: DG Chest Port 1 View  Result Date: 09/19/2021 CLINICAL DATA:  Concern for sepsis EXAM: PORTABLE CHEST 1 VIEW COMPARISON:  Chest x-ray dated September 11, 2021 FINDINGS: Cardiac and mediastinal contours are unchanged. Stable position of right central venous line. Diffuse bilateral interstitial opacities. No focal consolidation. Small right-greater-than-left pleural effusions, right pleural effusion is increased in size. No evidence of pneumothorax. IMPRESSION: 1. Similar diffuse bilateral interstitial opacities, likely due to pulmonary edema. 2. Increased small right pleural effusion. Electronically Signed   By: Yetta Glassman M.D.   On: 09/19/2021 14:22    Physical Exam: Vitals:   09/20/21 0735 09/20/21 0915 09/20/21 1145 09/20/21 1214  BP: 106/71   134/87  Pulse: (!) 120 (!) 121 (!) 119 (!) 119  Resp: (!) 22 (!) 23 (!) 23 (!) 26  Temp: 98.8 F (37.1 C)     TempSrc: Oral     SpO2: 92% 93% 96% 98%     General: WDWN NAD Head: sclera not icteric Lungs: Noted fine rales LLL; No wheeze, or rhonchi. Heart: RRR. No murmur, rubs or gallops.  Abdomen: soft, nontender, +BS, no guarding Lower extremities: no edema, ischemic changes, or open wounds  Neuro: AAOx3. Moves all extremities spontaneously. Dialysis Access: TDC and R AVF; L AVF (non-functioning)  Dialysis  Orders:  MWF - [place] ([facility]) 3hrs, BFR 400, DFR 800,  EDW 50.5kg, 3K/ 2.5Ca Micera 162mcg IV Q2wks-last dose: 09/18/21 Venofer 100mg  IV X 3 doses (new order written-not given yet) Heparin: No bolus  Assessment/Plan: Acute on chronic hypoxic respiratory failure-volume overload d/t ongoing non-compliance with HD. Received HD overnight-tolerated with net UF 3.5L. On O2 chronically-now on 3L. Noted tachycardia today. Plan for HD 09/21/21 in AM. ESRD - on HD MWF. Currently off schedule. Plan for HD tomorrow in AM. If patient discharged afterwards, can resume HD on Monday 1/23 per his usual schedule. Hypertension/volume  - Bps soft but stable. Continue Midodrine. Euvolemic on exam. Anemia of CKD - Hgb 10.9. ESA recently given on 09/18/21. Will resume in outpatient Secondary Hyperparathyroidism - Ca and PO4 okay. Resume binder regimen in outpatient. Nutrition - Renal diet with fluid restriction  Tobie Poet, NP [facility] 09/20/2021, 2:28 PM

## 2021-09-21 LAB — HEPATITIS B SURFACE ANTIBODY, QUANTITATIVE: Hep B S AB Quant (Post): <3.1 m[IU]/mL — ABNORMAL LOW (ref >9.9)

## 2021-09-21 MED ORDER — DILTIAZEM HCL ER COATED BEADS 300 MG PO CP24: 300.0000 mg | ORAL_CAPSULE | Freq: Every day | ORAL | 2 refills | Status: AC

## 2021-09-21 NOTE — Care Management (Addendum)
Notified nurse to obtain ambulatory saturations for DME oxygen prior to DC  Spoke w patient over the phone (in HD) he confirms that he has home oxygen through [employer], uses 2.5L at home. Plan is to DC to home at 2.5L.  Per previous TOC notes, [employer] needs new DME O2 order (MD has kindly placed this), and new ambulating sats.  CM will notify [employer] of order and note for their records.   Patient denied any other needs for home.   He will have a ride home who is bringing O2 for transportation

## 2021-09-21 NOTE — Progress Notes (Signed)
[facility] Progress Note   Subjective:    Seen and examined on HD. Currently tolerating HD. Denies SOB, CP, and N/V. UFG raised to 2L.  Objective Vitals:   09/21/21 1030 09/21/21 1100 09/21/21 1130 09/21/21 1140  BP: 119/76 120/69 111/75 121/78  Pulse: (!) 124 (!) 121 (!) 126 (!) 122  Resp: (!) 22 20 17    Temp:   (!) 97.5 F (36.4 C)   TempSrc:   Oral   SpO2: 98% 98% 97%   Weight:   44 kg    Physical Exam General: Frail-appearing; NAD Heart: Tachycardic; No murmurs, gallops, or rubs Lungs: Clear anteriorly; air movement posteriorly; No wheezing, rales, or rhonchi Abdomen: Soft and non-tender Extremities: No edema BLLE Dialysis Access: [facility] and R AVF; L AVF (non-functioning)   Filed Weights   09/21/21 0825 09/21/21 1130  Weight: 47 kg 44 kg    Intake/Output Summary (Last 24 hours) at 09/21/2021 1239 Last data filed at 09/21/2021 1130 Gross per 24 hour  Intake --  Output 2000 ml  Net -2000 ml    Additional Objective Labs: Basic Metabolic Panel: Recent Labs  Lab 09/19/21 1443 09/19/21 1509 09/19/21 1950 09/20/21 0813  NA 142 137 139 137  K 4.9 5.5* 4.9 4.4  CL 96*  --  98 99  CO2 26  --  26 26  GLUCOSE 107*  --  90 84  BUN 27*  --  28* 16  CREATININE 4.95*  --  5.02* 3.63*  CALCIUM 9.2  --  8.7* 8.3*  PHOS  --   --  3.9 3.3   Liver Function Tests: Recent Labs  Lab 09/19/21 1443 09/19/21 1950 09/20/21 0813  AST 19  --   --   ALT 10  --   --   ALKPHOS 120  --   --   BILITOT 1.0  --   --   PROT 6.8  --   --   ALBUMIN 2.8* 2.9* 2.9*   No results for input(s): LIPASE, AMYLASE in the last 168 hours. CBC: Recent Labs  Lab 09/19/21 1443 09/19/21 1509 09/20/21 0813  WBC 6.1  --  4.9  NEUTROABS 4.7  --   --   HGB 10.4* 11.2* 10.9*  HCT 34.8* 33.0* 34.7*  MCV 104.2*  --  102.4*  PLT 215  --  192   Blood Culture    Component Value Date/Time   SDES BLOOD BLOOD LEFT FOREARM 09/19/2021 1443   SPECREQUEST  09/19/2021 1443     BOTTLES DRAWN AEROBIC AND ANAEROBIC Blood Culture adequate volume   CULT  09/19/2021 1443    NO GROWTH 2 DAYS Performed at [facility], [place] [address], [place], [place] [address]    REPTSTATUS PENDING 09/19/2021 1443    Cardiac Enzymes: No results for input(s): CKTOTAL, CKMB, CKMBINDEX, TROPONINI in the last 168 hours. CBG: No results for input(s): GLUCAP in the last 168 hours. Iron Studies: No results for input(s): IRON, TIBC, TRANSFERRIN, FERRITIN in the last 72 hours. Lab Results  Component Value Date   INR 1.3 (H) 09/20/2021   INR 1.2 08/12/2021   INR 1.4 (H) 07/17/2021   Studies/Results: DG Chest Port 1 View  Result Date: 09/19/2021 CLINICAL DATA:  Concern for sepsis EXAM: PORTABLE CHEST 1 VIEW COMPARISON:  Chest x-ray dated September 11, 2021 FINDINGS: Cardiac and mediastinal contours are unchanged. Stable position of right central venous line. Diffuse bilateral interstitial opacities. No focal consolidation. Small right-greater-than-left pleural effusions, right pleural effusion  is increased in size. No evidence of pneumothorax. IMPRESSION: 1. Similar diffuse bilateral interstitial opacities, likely due to pulmonary edema. 2. Increased small right pleural effusion. Electronically Signed   By: Yetta Glassman M.D.   On: 09/19/2021 14:22    Medications:  sodium chloride     sodium chloride     sodium chloride      apixaban  2.5 mg Oral BID   Chlorhexidine Gluconate Cloth  6 each Topical Q0600   diltiazem  300 mg Oral Daily   fluticasone furoate-vilanterol  1 puff Inhalation Daily   mouth rinse  15 mL Mouth Rinse BID   midodrine  10 mg Oral TID WC   multivitamin  1 tablet Oral QHS   sodium chloride flush  3 mL Intravenous Q12H    Dialysis Orders: MWF - [place] ([facility]) 3hrs, BFR 400, DFR 800,  EDW 50.5kg, 3K/ 2.5Ca Micera 125mcg IV Q2wks-last dose: 09/18/21 Venofer 100mg  IV X 3 doses (new order written-not given yet) Heparin: No  bolus  Assessment/Plan: Acute on chronic hypoxic respiratory failure-volume overload d/t ongoing non-compliance with HD. Received HD overnight-tolerated with net UF 3.5L. On O2 chronically-now on 3L. Noted tachycardia today-continue Cardizem PO. On HD today.. ESRD - on HD MWF. Currently off schedule. HD today. If patient discharged afterwards, can resume HD on Monday 1/23 per his usual schedule. Hypertension/volume  - Bps stable. Continue Midodrine. Euvolemic on exam. Anemia of CKD - Hgb 10.9. ESA recently given on 09/18/21. Will resume in outpatient Secondary Hyperparathyroidism - Ca and PO4 okay. Resume binder regimen in outpatient. Nutrition - Renal diet with fluid restriction  Tobie Poet, NP [facility] 09/21/2021,12:39 PM  LOS: 1 day

## 2021-09-21 NOTE — Progress Notes (Signed)
Patient returned from HD. He immediately began screaming and cussing. Patient refused to complete ambulating pulse ox, " I don't have time for that, I've got to get out of here."  Patient threatened to pull his IV out and to have a bowel movement in the bed if he does not get a wheelchair and his discharged paperwork immediately.

## 2021-09-21 NOTE — Progress Notes (Addendum)
Pt. Refusing telemetry monitoring and vital signs measurement. Dr. Marlowe Sax made aware.

## 2021-09-21 NOTE — Progress Notes (Signed)
Patient is refusing to have V/S and CBG check, as well as medications.

## 2021-09-21 NOTE — Progress Notes (Signed)
SATURATION QUALIFICATIONS: (This note is used to comply with regulatory documentation for home oxygen)  Patient Saturations on Room Air at Rest = 88%  Patient Saturations on Room Air while Ambulating = n/a%    Please briefly explain why patient needs home oxygen: Acute on Chronic respiratory failure

## 2021-09-21 NOTE — Discharge Summary (Signed)
PATIENT DETAILS Name: Thomas Robinson Age: 61 y.o. Sex: male Date of Birth: 1960/11/03 MRN: [id number]. Admitting Physician: Jonetta Osgood, MD OIN:[facility], [place], [place]  Admit Date: 09/19/2021 Discharge date: 09/21/2021  Recommendations for Outpatient Follow-up:  Follow up with PCP in 1-2 weeks Please obtain CMP/CBC in one week Appears to be cachectic-please ensure patient is up-to-date with cancer screening-May need further work-up at discretion of PCP Needs ongoing counseling regarding compliance to dialysis.   Admitted From:  Home  Disposition: [place]: No  Equipment/Devices: None  Discharge Condition: Stable  CODE STATUS: FULL CODE  Diet recommendation:  Diet Order             Diet - low sodium heart healthy           Diet renal/carb modified with fluid restriction Diet-HS Snack? Nothing; Fluid restriction: 1200 mL Fluid; Room service appropriate? Yes; Fluid consistency: Thin  Diet effective now                    Brief Summary: 61 year old male with history of ESRD on HD MWF-noncompliance-presenting with shortness of breath due to volume overload.  See below for further details.  Brief Hospital Course: * Acute on chronic respiratory failure with hypoxemia due to volume overload due to noncompliance with hemodialysis - (present on admission) Feels significantly better after HD x2.  Feels he is close to baseline.  He will resume regular home O2 at 2.5 L on discharge.  He is aware that he will need to follow-up with his hemodialysis center at his usual schedule post discharge.  Has been counseled regarding importance of completing dialysis session-he apparently has been noncompliant and also cutting short his dialysis sessions.  Recurrent pleural effusion on right- (present on admission) Reviewed most recent discharge summary-prior thoracocentesis consistent with a transudate.  I suspect this is due to noncompliance with  hemodialysis.  ESRD on dialysis on HD MWF-but noncompliant Discussed with nephrology-he apparently has been cutting short his outpatient hemodialysis.  Longstanding history of noncompliance.  Hypoxia/shortness of breath much better after HD x2-plans are to resume usual outpatient HD on MWF.    Non-compliance with renal dialysis [facility]) Has been counseled extensively.  Anemia in ESRD (end-stage renal disease) ([place])- (present on admission) Will defer Aranesp/iron to nephrology service.  Essential hypertension- (present on admission) BP stable-on midodrine-reviewed prior notes during his most recent hospitalization-BP has been soft.  Suspect patient is on Cardizem for rate control.  We will continue both of these agents and follow closely.  Persistent atrial fibrillation ([place])- (present on admission) Heart rate slightly elevated at times-I have increased dosage of Cardizem-remains on Eliquis.  Follow-up with outpatient MD/cardiology for further optimization.  Chronic diastolic (congestive) heart failure ([place])- (present on admission) Volume status much better-see above.   Prolonged QT interval- (present on admission) Appears to be in prior EKGs as well-K/Mg stable.  He was monitored in telemetry initially-but refused further telemetry monitoring.  Continue to follow periodically in the outpatient setting.  Appears to be mild-avoid QTC prolonging agents.  PCP to consider rechecking twelve-lead EKG at next follow-up.  Atypical chest pain- (present on admission) Resolved-atypical chest pain in the setting of volume overload.  Troponins minimally elevated and flat-likely insignificant.   Pulmonary emboli ([place])- (present on admission) Small PE seen on recent CT chest on 08/26/2021-continue Eliquis.  Unclear how compliant he has been.  Cachexia ([place])- (present on admission) Apparently have significant weight loss over the past several months-this  is unintentional.  Recent TSH without signs of  hyperthyroidism.  Recent HIV test was negative.  CT chest did not show any malignancy-cytology from right pleural fluid x2 late last year was negative for malignancy.  Have advised to follow-up with his PCP for further work-up to be done in the outpatient setting.  Suspect it may be due to ESRD-and noncompliance with dialysis.   BMI: Estimated body mass index is 15.75 kg/m as calculated from the following:   Height as of 09/08/21: 5\' 8"  (1.727 m).   Weight as of this encounter: 47 kg.   Procedures None  Discharge Diagnoses:  Principal Problem:   Acute on chronic respiratory failure with hypoxemia due to volume overload due to noncompliance with hemodialysis  Active Problems:   Recurrent pleural effusion on right   ESRD on dialysis on HD MWF-but noncompliant   Non-compliance with renal dialysis ([place])   Anemia in ESRD (end-stage renal disease) (H[facility])   Essential hypertension   Persistent atrial fibrillation (H[facility])   Chronic diastolic (congestive) heart failure (H[facility])   Prolonged QT interval   Atypical chest pain   Pulmonary emboli ([place])   Cachexia ([place])   Discharge Instructions:  Activity:  As tolerated    Discharge Instructions     Call MD for:  difficulty breathing, headache or visual disturbances   Complete by: As directed    Diet - low sodium heart healthy   Complete by: As directed    Discharge instructions   Complete by: As directed    Follow with Primary MD  [name], PA in 1-2 weeks  Resume dialysis as previous.    Please get a complete blood count and chemistry panel checked by your Primary MD at your next visit, and again as instructed by your Primary MD.  Get Medicines reviewed and adjusted: Please take all your medications with you for your next visit with your Primary MD  Laboratory/radiological data: Please request your Primary MD to go over all hospital tests and procedure/radiological results at the follow up, please ask your Primary MD to get all  Hospital records sent to his/her office.  In some cases, they will be blood work, cultures and biopsy results pending at the time of your discharge. Please request that your primary care M.D. follows up on these results.  Also Note the following: If you experience worsening of your admission symptoms, develop shortness of breath, life threatening emergency, suicidal or homicidal thoughts you must seek medical attention immediately by calling 911 or calling your MD immediately  if symptoms less severe.  You must read complete instructions/literature along with all the possible adverse reactions/side effects for all the Medicines you take and that have been prescribed to you. Take any new Medicines after you have completely understood and accpet all the possible adverse reactions/side effects.   Do not drive when taking Pain medications or sleeping medications (Benzodaizepines)  Do not take more than prescribed Pain, Sleep and Anxiety Medications. It is not advisable to combine anxiety,sleep and pain medications without talking with your primary care practitioner  Special Instructions: If you have smoked or chewed Tobacco  in the last 2 yrs please stop smoking, stop any regular Alcohol  and or any Recreational drug use.  Wear Seat belts while driving.  Please note: You were cared for by a hospitalist during your hospital stay. Once you are discharged, your primary care physician will handle any further medical issues. Please note that NO REFILLS for any discharge medications will be  authorized once you are discharged, as it is imperative that you return to your primary care physician (or establish a relationship with a primary care physician if you do not have one) for your post hospital discharge needs so that they can reassess your need for medications and monitor your lab values.   Increase activity slowly   Complete by: As directed    No wound care   Complete by: As directed        Allergies as of 09/21/2021       Reactions   Vancomycin Shortness Of Breath, Itching        Medication List     TAKE these medications    albuterol 108 (90 Base) MCG/ACT inhaler Commonly known as: VENTOLIN HFA Inhale 2 puffs into the lungs 2 (two) times daily as needed for shortness of breath or wheezing.   cinacalcet 60 MG tablet Commonly known as: SENSIPAR Take 1 tablet (60 mg total) by mouth every evening.   diltiazem 300 MG 24 hr capsule Commonly known as: CARDIZEM CD Take 1 capsule (300 mg total) by mouth daily. What changed:  medication strength how much to take   Eliquis 2.5 MG Tabs tablet Generic drug: apixaban Take 1 tablet (2.5 mg total) by mouth 2 (two) times daily.   feeding supplement Liqd Take 237 mLs by mouth 2 (two) times daily between meals.   fluticasone furoate-vilanterol 100-25 MCG/ACT Aepb Commonly known as: BREO ELLIPTA Inhale 1 puff into the lungs daily as needed (For shortness of breath).   midodrine 10 MG tablet Commonly known as: PROAMATINE Take 1 tablet (10 mg total) by mouth 3 (three) times daily with meals.   multivitamin Tabs tablet Take 1 tablet by mouth at bedtime.   sevelamer carbonate 800 MG tablet Commonly known as: RENVELA Take 800 mg by mouth 3 (three) times daily with meals.        Allergies  Allergen Reactions   Vancomycin Shortness Of Breath and Itching      Consultations:  Renal   Other Procedures/Studies: DG Chest 1 View  Result Date: 09/11/2021 CLINICAL DATA:  Post thoracentesis. EXAM: PORTABLE CHEST 1 VIEW COMPARISON:  IR ultrasound, earlier same day. Chest XR, 09/10/2021 and 09/07/2018. CT chest, 08/26/2021. FINDINGS: Support lines: RIGHT IJ CVC with catheter tip at the proximal RIGHT atrium. Overlying pacer leads. Enlargement of the cardiac silhouette. Aortic arch calcifications. Interstitial thickening and coarse bilateral perihilar opacities. No focal consolidation. Trace residual RIGHT pleural  effusion. No pneumothorax. Cervical fusion hardware. No interval osseous abnormality. IMPRESSION: 1. Interval removal of RIGHT pleural effusion with trace residual. No postprocedure pneumothorax. 2. Cardiomegaly with pulmonary findings consistent with pulmonary edema. 3. Aortic Atherosclerosis (ICD10-I70.0). Electronically Signed   By: Michaelle Birks M.D.   On: 09/11/2021 13:32   DG Chest 1 View  Result Date: 08/25/2021 CLINICAL DATA:  Chest pain since last night, end-stage renal disease on hemodialysis, nonproductive cough, CHF, COPD, hypertension, asthma EXAM: CHEST  1 VIEW COMPARISON:  Portable exam 1842 hours compared to 08/11/2021 FINDINGS: RIGHT jugular dual lumen central venous catheter with tip projecting over SVC. Enlargement of cardiac silhouette with pulmonary vascular congestion. Diffuse pulmonary infiltrates increased from previous exam consistent with pulmonary edema. Persistent RIGHT pleural effusion and basilar atelectasis. No pneumothorax. Atherosclerotic calcification aortic arch. Prior cervical spine fusion. IMPRESSION: Pulmonary edema consistent with CHF versus fluid overload, increased from previous exam. Persistent RIGHT pleural effusion and basilar atelectasis. Aortic Atherosclerosis (ICD10-I70.0). Electronically Signed   By: Crist Infante.D.  On: 08/25/2021 18:49   DG Chest 2 View  Result Date: 08/28/2021 CLINICAL DATA:  SOB EXAM: CHEST - 2 VIEW COMPARISON:  08/25/2021 FINDINGS: Right chest wall dialysis catheter is again noted with tip projecting over the distal SVC. Stable cardiomediastinal contours. Small right pleural effusion is unchanged. Atelectasis and or airspace disease within the right lower lobe is unchanged. Diffuse hazy lung opacities are again seen compatible with pulmonary edema. IMPRESSION: Persistent right pleural effusion with overlying atelectasis/airspace disease. Unchanged mild diffuse edema. Electronically Signed   By: Kerby Moors M.D.   On: 08/28/2021 18:13    DG Chest Port 1 View  Result Date: 09/19/2021 CLINICAL DATA:  Concern for sepsis EXAM: PORTABLE CHEST 1 VIEW COMPARISON:  Chest x-ray dated September 11, 2021 FINDINGS: Cardiac and mediastinal contours are unchanged. Stable position of right central venous line. Diffuse bilateral interstitial opacities. No focal consolidation. Small right-greater-than-left pleural effusions, right pleural effusion is increased in size. No evidence of pneumothorax. IMPRESSION: 1. Similar diffuse bilateral interstitial opacities, likely due to pulmonary edema. 2. Increased small right pleural effusion. Electronically Signed   By: Yetta Glassman M.D.   On: 09/19/2021 14:22   DG CHEST PORT 1 VIEW  Result Date: 09/10/2021 CLINICAL DATA:  Shortness of breath.  Hypoxemia. EXAM: PORTABLE CHEST 1 VIEW COMPARISON:  09/07/2021 FINDINGS: Central line remains in place with its tip in the SVC just above the right atrium. Persistent cardiomegaly. Worsening of pulmonary edema pattern. Bilateral effusions persist, right larger than left. Coexistent pneumonia not excluded. IMPRESSION: Some worsening of edema pattern. Persistent effusions, right larger than left. Electronically Signed   By: Nelson Chimes M.D.   On: 09/10/2021 10:29   DG Chest Port 1 View  Result Date: 09/07/2021 CLINICAL DATA:  Weakness and shortness of breath. EXAM: PORTABLE CHEST 1 VIEW COMPARISON:  Most recent radiograph 08/28/2021. Chest CTA 08/26/2021 FINDINGS: Right-sided dialysis catheter unchanged in position. Cardiomegaly is stable. Increasing right pleural effusion and basilar atelectasis. Increasing pulmonary edema. No pneumothorax. Stable osseous structures. IMPRESSION: 1. Increasing right pleural effusion and basilar atelectasis. 2. Increasing pulmonary edema. Electronically Signed   By: Keith Rake M.D.   On: 09/07/2021 17:58   CT ANGIO CHEST AORTA W/CM & OR WO/CM  Addendum Date: 08/26/2021   ADDENDUM REPORT: 08/26/2021 08:08 ADDENDUM: Study  discussed by telephone with Dr. Roosevelt Locks on 08/26/2021 at 0802 hours. Electronically Signed   By: Genevie Ann M.D.   On: 08/26/2021 08:08   Result Date: 08/26/2021 CLINICAL DATA:  61 year old male with chest pain and shortness of breath. Acute aortic syndrome suspected. EXAM: CT ANGIOGRAPHY CHEST WITH CONTRAST TECHNIQUE: Multidetector CT imaging of the chest was performed using the standard protocol both prior to and during bolus administration of intravenous contrast. Multiplanar CT image reconstructions and MIPs were obtained to evaluate the vascular anatomy. CONTRAST:  51mL OMNIPAQUE IOHEXOL 350 MG/ML SOLN COMPARISON:  CTA chest 06/30/2021. FINDINGS: Cardiovascular: Study designed for aortic IV contrast timing but there is also good opacification of the bilateral pulmonary arteries. Superimposed respiratory motion. Small volume thrombus in right upper lobe pulmonary artery branches, primarily nonocclusive series 6 images 69 through 71. No other convincing focal filling defect identified in the pulmonary arteries. Stable tortuosity and mild fusiform enlargement of the ascending aorta up to 42 mm diameter. Negative for aortic dissection or saccular aneurysm. Mild-to-moderate Calcified aortic atherosclerosis. Calcified coronary artery atherosclerosis and/or stents. Stable cardiomegaly. No pericardial effusion. Mediastinum/Nodes: Negative. No mediastinal mass or lymphadenopathy. Lungs/Pleura: Moderate layering right pleural effusion has not  significantly changed since October. There is confluent and enhancing compressive atelectasis in the dependent right upper lobe and right lower lobe. Smaller left pleural effusion with superimposed mild pleural thickening and calcification is stable. Stable associated left lower lobe atelectasis. Major airways remain patent. Diffuse superimposed centrilobular pulmonary ground-glass opacity in both lungs. Upper Abdomen: Negative visible liver, spleen, adrenal glands and bowel in the  upper abdomen. Partially visible renal atrophy. Musculoskeletal: Previous lower cervical ACDF. Midthoracic flowing endplate osteophytes and ankylosis. Generalized increased bony sclerosis suggesting renal osteodystrophy. No acute or suspicious osseous lesion. Review of the MIP images confirms the above findings. IMPRESSION: 1. Positive for small and primarily nonocclusive right upper lobe pulmonary embolus (series 6, image 71), overall clinical significance doubtful. 2. Negative for acute aortic syndrome. Stable borderline aneurysmal Ascending thoracic aorta. Recommend annual imaging followup by CTA or MRA as detailed on October CTA. Aortic aneurysm NOS (ICD10-I71.9) Aortic Atherosclerosis (ICD10-I70.0). 3. Ongoing diffuse pulmonary ground-glass opacity suspicious for Acute Pulmonary Edema. Not significantly changed from October superimposed moderate layering right and small left pleural effusions with compressive atelectasis. 4. Partially visible renal atrophy.  Renal osteodystrophy. Electronically Signed: By: Genevie Ann M.D. On: 08/26/2021 07:00   IR THORACENTESIS ASP PLEURAL SPACE W/IMG GUIDE  Result Date: 09/11/2021 INDICATION: Shortness of breath, bilateral right greater than left pleural effusions seen on previous chest x-ray. Request for therapeutic and diagnostic thoracentesis. EXAM: ULTRASOUND GUIDED RIGHT THORACENTESIS MEDICATIONS: 8 mL 1% lidocaine COMPLICATIONS: None immediate. PROCEDURE: An ultrasound guided thoracentesis was thoroughly discussed with the patient and questions answered. The benefits, risks, alternatives and complications were also discussed. The patient understands and wishes to proceed with the procedure. Written consent was obtained. Ultrasound was performed to localize and mark an adequate pocket of fluid in the right chest. The area was then prepped and draped in the normal sterile fashion. 1% Lidocaine was used for local anesthesia. Under ultrasound guidance a 6 Fr Safe-T-Centesis  catheter was introduced. Thoracentesis was performed. The catheter was removed and a dressing applied. FINDINGS: A total of approximately 1.3 L of hazy light brown fluid was removed. Samples were sent to the laboratory as requested by the clinical team. Post procedure chest X-ray reviewed, negative for pneumothorax. IMPRESSION: Successful ultrasound guided right thoracentesis yielding 1.3 L of pleural fluid. Read by: Durenda Guthrie, PA-C Electronically Signed   By: Sandi Mariscal M.D.   On: 09/11/2021 15:26     TODAY-DAY OF DISCHARGE:  Subjective:   Layla Barter today has no headache,no chest abdominal pain,no new weakness tingling or numbness, feels much better wants to go home today.   Objective:   Blood pressure 113/74, pulse (!) 123, temperature (!) 97.1 F (36.2 C), temperature source Oral, resp. rate (!) 25, weight 47 kg, SpO2 98 %. No intake or output data in the 24 hours ending 09/21/21 1020 Filed Weights   09/21/21 0825  Weight: 47 kg    Exam: Awake Alert, Oriented *3, No new F.N deficits, Normal affect [place].AT,PERRAL Supple Neck,No JVD, No cervical lymphadenopathy appriciated.  Symmetrical Chest wall movement, Good air movement bilaterally, CTAB RRR,No Gallops,Rubs or new Murmurs, No Parasternal Heave +ve B.Sounds, Abd Soft, Non tender, No organomegaly appriciated, No rebound -guarding or rigidity. No Cyanosis, Clubbing or edema, No new Rash or bruise   PERTINENT RADIOLOGIC STUDIES: DG Chest Port 1 View  Result Date: 09/19/2021 CLINICAL DATA:  Concern for sepsis EXAM: PORTABLE CHEST 1 VIEW COMPARISON:  Chest x-ray dated September 11, 2021 FINDINGS: Cardiac and mediastinal contours are unchanged.  Stable position of right central venous line. Diffuse bilateral interstitial opacities. No focal consolidation. Small right-greater-than-left pleural effusions, right pleural effusion is increased in size. No evidence of pneumothorax. IMPRESSION: 1. Similar diffuse bilateral interstitial  opacities, likely due to pulmonary edema. 2. Increased small right pleural effusion. Electronically Signed   By: Yetta Glassman M.D.   On: 09/19/2021 14:22     PERTINENT LAB RESULTS: CBC: Recent Labs    09/19/21 1443 09/19/21 1509 09/20/21 0813  WBC 6.1  --  4.9  HGB 10.4* 11.2* 10.9*  HCT 34.8* 33.0* 34.7*  PLT 215  --  192   CMET CMP     Component Value Date/Time   NA 137 09/20/2021 0813   NA 141 03/21/2020 1515   K 4.4 09/20/2021 0813   CL 99 09/20/2021 0813   CO2 26 09/20/2021 0813   GLUCOSE 84 09/20/2021 0813   BUN 16 09/20/2021 0813   BUN 53 (H) 03/21/2020 1515   CREATININE 3.63 (H) 09/20/2021 0813   CALCIUM 8.3 (L) 09/20/2021 0813   PROT 6.8 09/19/2021 1443   ALBUMIN 2.9 (L) 09/20/2021 0813   AST 19 09/19/2021 1443   ALT 10 09/19/2021 1443   ALKPHOS 120 09/19/2021 1443   BILITOT 1.0 09/19/2021 1443   GFRNONAA 18 (L) 09/20/2021 0813   GFRAA 4 (L) 05/30/2020 1643    GFR Estimated Creatinine Clearance: 14 mL/min (A) (by C-G formula based on SCr of 3.63 mg/dL (H)). No results for input(s): LIPASE, AMYLASE in the last 72 hours. No results for input(s): CKTOTAL, CKMB, CKMBINDEX, TROPONINI in the last 72 hours. Invalid input(s): POCBNP No results for input(s): DDIMER in the last 72 hours. No results for input(s): HGBA1C in the last 72 hours. No results for input(s): CHOL, HDL, LDLCALC, TRIG, CHOLHDL, LDLDIRECT in the last 72 hours. No results for input(s): TSH, T4TOTAL, T3FREE, THYROIDAB in the last 72 hours.  Invalid input(s): FREET3 No results for input(s): VITAMINB12, FOLATE, FERRITIN, TIBC, IRON, RETICCTPCT in the last 72 hours. Coags: Recent Labs    09/20/21 0813  INR 1.3*   Microbiology: Recent Results (from the past 240 hour(s))  Culture, body fluid w Gram Stain-bottle     Status: None   Collection Time: 09/11/21  1:16 PM   Specimen: Fluid  Result Value Ref Range Status   Specimen Description FLUID RIGHT PLEURAL  Final   Special Requests  BOTTLES DRAWN AEROBIC AND ANAEROBIC 10CC  Final   Culture   Final    NO GROWTH 5 DAYS Performed at [facility], [address] [place], [place] [address]    Report Status 09/16/2021 FINAL  Final  Gram stain     Status: None   Collection Time: 09/11/21  1:16 PM   Specimen: Fluid  Result Value Ref Range Status   Specimen Description FLUID RIGHT PLEURAL  Final   Special Requests NONE  Final   Gram Stain   Final    RARE WBC PRESENT, PREDOMINANTLY MONONUCLEAR NO ORGANISMS SEEN Performed at [facility], [place] [address] [place], [place] [address]    Report Status 09/11/2021 FINAL  Final  Blood Culture (routine x 2)     Status: None (Preliminary result)   Collection Time: 09/19/21  2:43 PM   Specimen: BLOOD  Result Value Ref Range Status   Specimen Description BLOOD BLOOD LEFT FOREARM  Final   Special Requests   Final    BOTTLES DRAWN AEROBIC AND ANAEROBIC Blood Culture adequate volume   Culture  Final    NO GROWTH 2 DAYS Performed at [facility], [place] [address], [place], [place] [address]    Report Status PENDING  Incomplete  Resp Panel by RT-PCR (Flu A&B, Covid) Nasopharyngeal Swab     Status: None   Collection Time: 09/19/21  7:31 PM   Specimen: Nasopharyngeal Swab; Nasopharyngeal(NP) swabs in vial transport medium  Result Value Ref Range Status   SARS Coronavirus 2 by RT PCR NEGATIVE NEGATIVE Final    Comment: (NOTE) SARS-CoV-2 target nucleic acids are NOT DETECTED.  The SARS-CoV-2 RNA is generally detectable in upper respiratory specimens during the acute phase of infection. The lowest concentration of SARS-CoV-2 viral copies this assay can detect is 138 copies/mL. A negative result does not preclude SARS-Cov-2 infection and should not be used as the sole basis for treatment or other patient management decisions. A negative result may occur with  improper specimen collection/handling, submission of specimen other than nasopharyngeal swab, presence of  viral mutation(s) within the areas targeted by this assay, and inadequate number of viral copies(<138 copies/mL). A negative result must be combined with clinical observations, patient history, and epidemiological information. The expected result is Negative.  Fact Sheet for Patients:  EntrepreneurPulse.com.au  Fact Sheet for Healthcare Providers:  IncredibleEmployment.be  This test is no t yet approved or cleared by the [place] FDA and  has been authorized for detection and/or diagnosis of SARS-CoV-2 by FDA under an Emergency Use Authorization (EUA). This EUA will remain  in effect (meaning this test can be used) for the duration of the COVID-19 declaration under Section 564(b)(1) of the Act, 21 U.S.C.section 360bbb-3(b)(1), unless the authorization is terminated  or revoked sooner.       Influenza A by PCR NEGATIVE NEGATIVE Final   Influenza B by PCR NEGATIVE NEGATIVE Final    Comment: (NOTE) The Xpert Xpress SARS-CoV-2/FLU/RSV plus assay is intended as an aid in the diagnosis of influenza from Nasopharyngeal swab specimens and should not be used as a sole basis for treatment. Nasal washings and aspirates are unacceptable for Xpert Xpress SARS-CoV-2/FLU/RSV testing.  Fact Sheet for Patients: EntrepreneurPulse.com.au  Fact Sheet for Healthcare Providers: IncredibleEmployment.be  This test is not yet approved or cleared by the [place] FDA and has been authorized for detection and/or diagnosis of SARS-CoV-2 by FDA under an Emergency Use Authorization (EUA). This EUA will remain in effect (meaning this test can be used) for the duration of the COVID-19 declaration under Section 564(b)(1) of the Act, 21 U.S.C. section 360bbb-3(b)(1), unless the authorization is terminated or revoked.  Performed at [facility], [place] [address], [place], [place] [address]     FURTHER DISCHARGE  INSTRUCTIONS:  Get Medicines reviewed and adjusted: Please take all your medications with you for your next visit with your Primary MD  Laboratory/radiological data: Please request your Primary MD to go over all hospital tests and procedure/radiological results at the follow up, please ask your Primary MD to get all Hospital records sent to his/her office.  In some cases, they will be blood work, cultures and biopsy results pending at the time of your discharge. Please request that your primary care M.D. goes through all the records of your hospital data and follows up on these results.  Also Note the following: If you experience worsening of your admission symptoms, develop shortness of breath, life threatening emergency, suicidal or homicidal thoughts you must seek medical attention immediately by calling 911 or calling your MD immediately  if  symptoms less severe.  You must read complete instructions/literature along with all the possible adverse reactions/side effects for all the Medicines you take and that have been prescribed to you. Take any new Medicines after you have completely understood and accpet all the possible adverse reactions/side effects.   Do not drive when taking Pain medications or sleeping medications (Benzodaizepines)  Do not take more than prescribed Pain, Sleep and Anxiety Medications. It is not advisable to combine anxiety,sleep and pain medications without talking with your primary care practitioner  Special Instructions: If you have smoked or chewed Tobacco  in the last 2 yrs please stop smoking, stop any regular Alcohol  and or any Recreational drug use.  Wear Seat belts while driving.  Please note: You were cared for by a hospitalist during your hospital stay. Once you are discharged, your primary care physician will handle any further medical issues. Please note that NO REFILLS for any discharge medications will be authorized once you are discharged, as it is  imperative that you return to your primary care physician (or establish a relationship with a primary care physician if you do not have one) for your post hospital discharge needs so that they can reassess your need for medications and monitor your lab values.  Total Time spent coordinating discharge including counseling, education and face to face time equals 35 minutes.  Signed: Mayleen Borrero 09/21/2021 10:20 AM

## 2021-09-23 ENCOUNTER — Other Ambulatory Visit (HOSPITAL_COMMUNITY): Payer: Self-pay

## 2021-09-23 ENCOUNTER — Telehealth (HOSPITAL_COMMUNITY): Payer: Self-pay | Admitting: Pharmacist

## 2021-09-23 NOTE — Telephone Encounter (Signed)
Transitions of Care Pharmacy   Call attempted for a pharmacy transitions of care follow-up. HIPAA appropriate voicemail was left with call back information provided.   Call attempt #1. Will follow-up in 1-3 days.

## 2021-09-24 LAB — CULTURE, BLOOD (ROUTINE X 2)
Culture: NO GROWTH
Special Requests: ADEQUATE

## 2021-09-25 ENCOUNTER — Other Ambulatory Visit (HOSPITAL_COMMUNITY): Payer: Self-pay

## 2021-09-25 ENCOUNTER — Telehealth (HOSPITAL_COMMUNITY): Payer: Self-pay | Admitting: Pharmacist

## 2021-09-25 NOTE — Telephone Encounter (Signed)
Transitions of Care Pharmacy   Call attempted for a pharmacy transitions of care follow-up. HIPAA appropriate voicemail was left with call back information provided.   Call attempt #2. Will follow-up in 1-3 days.

## 2021-09-26 ENCOUNTER — Other Ambulatory Visit (HOSPITAL_COMMUNITY): Payer: Self-pay

## 2021-09-26 ENCOUNTER — Telehealth (HOSPITAL_COMMUNITY): Payer: Self-pay

## 2021-09-26 NOTE — Telephone Encounter (Signed)
Transitions of Care Pharmacy   Call attempted for a pharmacy transitions of care follow-up. Patient picked up the phone and did not want to talk. Stated they had no questions about their medications and they had to go and then hung up the phone.  Call attempt #3. Will no longer attempt follow up for [facility] pharmacy.

## 2021-09-29 ENCOUNTER — Inpatient Hospital Stay (HOSPITAL_COMMUNITY)
Admission: EM | Admit: 2021-09-29 | Discharge: 2021-10-01 | DRG: 291 | Disposition: A | Discharge status: Home Health | Payer: Medicare Other | Attending: Internal Medicine | Admitting: Internal Medicine

## 2021-09-29 ENCOUNTER — Emergency Department (HOSPITAL_COMMUNITY): Payer: Medicare Other

## 2021-09-29 ENCOUNTER — Other Ambulatory Visit: Payer: Self-pay

## 2021-09-29 ENCOUNTER — Encounter (HOSPITAL_COMMUNITY): Payer: Self-pay

## 2021-09-29 DIAGNOSIS — I272 Pulmonary hypertension, unspecified: Secondary | ICD-10-CM | POA: Diagnosis present

## 2021-09-29 DIAGNOSIS — N186 End stage renal disease: Secondary | ICD-10-CM | POA: Diagnosis present

## 2021-09-29 DIAGNOSIS — E43 Unspecified severe protein-calorie malnutrition: Secondary | ICD-10-CM | POA: Diagnosis present

## 2021-09-29 DIAGNOSIS — L89152 Pressure ulcer of sacral region, stage 2: Secondary | ICD-10-CM | POA: Diagnosis present

## 2021-09-29 DIAGNOSIS — R0902 Hypoxemia: Secondary | ICD-10-CM | POA: Diagnosis present

## 2021-09-29 DIAGNOSIS — Z8673 Personal history of transient ischemic attack (TIA), and cerebral infarction without residual deficits: Secondary | ICD-10-CM

## 2021-09-29 DIAGNOSIS — M898X9 Other specified disorders of bone, unspecified site: Secondary | ICD-10-CM | POA: Diagnosis present

## 2021-09-29 DIAGNOSIS — G2581 Restless legs syndrome: Secondary | ICD-10-CM | POA: Diagnosis present

## 2021-09-29 DIAGNOSIS — R0789 Other chest pain: Secondary | ICD-10-CM | POA: Diagnosis present

## 2021-09-29 DIAGNOSIS — R0602 Shortness of breath: Secondary | ICD-10-CM | POA: Diagnosis present

## 2021-09-29 DIAGNOSIS — Z66 Do not resuscitate: Secondary | ICD-10-CM | POA: Diagnosis present

## 2021-09-29 DIAGNOSIS — Z9981 Dependence on supplemental oxygen: Secondary | ICD-10-CM

## 2021-09-29 DIAGNOSIS — Z7189 Other specified counseling: Secondary | ICD-10-CM

## 2021-09-29 DIAGNOSIS — J449 Chronic obstructive pulmonary disease, unspecified: Secondary | ICD-10-CM | POA: Diagnosis present

## 2021-09-29 DIAGNOSIS — I132 Hypertensive heart and chronic kidney disease with heart failure and with stage 5 chronic kidney disease, or end stage renal disease: Principal | ICD-10-CM | POA: Diagnosis present

## 2021-09-29 DIAGNOSIS — E785 Hyperlipidemia, unspecified: Secondary | ICD-10-CM | POA: Diagnosis present

## 2021-09-29 DIAGNOSIS — Z992 Dependence on renal dialysis: Secondary | ICD-10-CM

## 2021-09-29 DIAGNOSIS — Z981 Arthrodesis status: Secondary | ICD-10-CM

## 2021-09-29 DIAGNOSIS — I480 Paroxysmal atrial fibrillation: Secondary | ICD-10-CM | POA: Diagnosis present

## 2021-09-29 DIAGNOSIS — Z681 Body mass index (BMI) 19 or less, adult: Secondary | ICD-10-CM | POA: Diagnosis not present

## 2021-09-29 DIAGNOSIS — K219 Gastro-esophageal reflux disease without esophagitis: Secondary | ICD-10-CM | POA: Diagnosis present

## 2021-09-29 DIAGNOSIS — E8809 Other disorders of plasma-protein metabolism, not elsewhere classified: Secondary | ICD-10-CM | POA: Diagnosis present

## 2021-09-29 DIAGNOSIS — I5033 Acute on chronic diastolic (congestive) heart failure: Secondary | ICD-10-CM | POA: Diagnosis present

## 2021-09-29 DIAGNOSIS — I9589 Other hypotension: Secondary | ICD-10-CM | POA: Diagnosis present

## 2021-09-29 DIAGNOSIS — Z515 Encounter for palliative care: Secondary | ICD-10-CM | POA: Diagnosis not present

## 2021-09-29 DIAGNOSIS — Z91199 Patient's noncompliance with other medical treatment and regimen due to unspecified reason: Secondary | ICD-10-CM | POA: Diagnosis not present

## 2021-09-29 DIAGNOSIS — Z20822 Contact with and (suspected) exposure to covid-19: Secondary | ICD-10-CM | POA: Diagnosis present

## 2021-09-29 DIAGNOSIS — I083 Combined rheumatic disorders of mitral, aortic and tricuspid valves: Secondary | ICD-10-CM | POA: Diagnosis present

## 2021-09-29 DIAGNOSIS — J9621 Acute and chronic respiratory failure with hypoxia: Secondary | ICD-10-CM | POA: Diagnosis present

## 2021-09-29 DIAGNOSIS — R64 Cachexia: Secondary | ICD-10-CM | POA: Diagnosis present

## 2021-09-29 DIAGNOSIS — D631 Anemia in chronic kidney disease: Secondary | ICD-10-CM | POA: Diagnosis present

## 2021-09-29 DIAGNOSIS — Z888 Allergy status to other drugs, medicaments and biological substances status: Secondary | ICD-10-CM

## 2021-09-29 DIAGNOSIS — Z79899 Other long term (current) drug therapy: Secondary | ICD-10-CM

## 2021-09-29 DIAGNOSIS — Z7901 Long term (current) use of anticoagulants: Secondary | ICD-10-CM

## 2021-09-29 LAB — PROCALCITONIN: Procalcitonin: 0.54 ng/mL

## 2021-09-29 LAB — COMPREHENSIVE METABOLIC PANEL
ALT: 11 U/L (ref 0–44)
AST: 17 U/L (ref 15–41)
Albumin: 3.2 g/dL — ABNORMAL LOW (ref 3.5–5.0)
Alkaline Phosphatase: 146 U/L — ABNORMAL HIGH (ref 38–126)
Anion gap: 17 — ABNORMAL HIGH (ref 5–15)
BUN: 63 mg/dL — ABNORMAL HIGH (ref 8–23)
CO2: 23 mmol/L (ref 22–32)
Calcium: 7.9 mg/dL — ABNORMAL LOW (ref 8.9–10.3)
Chloride: 100 mmol/L (ref 98–111)
Creatinine, Ser: 7.55 mg/dL — ABNORMAL HIGH (ref 0.61–1.24)
GFR, Estimated: 8 mL/min — ABNORMAL LOW (ref >60)
Glucose, Bld: 100 mg/dL — ABNORMAL HIGH (ref 70–99)
Potassium: 4.9 mmol/L (ref 3.5–5.1)
Sodium: 140 mmol/L (ref 135–145)
Total Bilirubin: 0.9 mg/dL (ref 0.3–1.2)
Total Protein: 7.3 g/dL (ref 6.5–8.1)

## 2021-09-29 LAB — CBC WITH DIFFERENTIAL/PLATELET
Abs Immature Granulocytes: 0.03 10*3/uL (ref 0.00–0.07)
Basophils Absolute: 0.1 10*3/uL (ref 0.0–0.1)
Basophils Relative: 2 %
Eosinophils Absolute: 0.2 10*3/uL (ref 0.0–0.5)
Eosinophils Relative: 3 %
HCT: 35.6 % — ABNORMAL LOW (ref 39.0–52.0)
Hemoglobin: 11.1 g/dL — ABNORMAL LOW (ref 13.0–17.0)
Immature Granulocytes: 0 %
Lymphocytes Relative: 10 %
Lymphs Abs: 0.7 10*3/uL (ref 0.7–4.0)
MCH: 32.3 pg (ref 26.0–34.0)
MCHC: 31.2 g/dL (ref 30.0–36.0)
MCV: 103.5 fL — ABNORMAL HIGH (ref 80.0–100.0)
Monocytes Absolute: 0.4 10*3/uL (ref 0.1–1.0)
Monocytes Relative: 5 %
Neutro Abs: 6.3 10*3/uL (ref 1.7–7.7)
Neutrophils Relative %: 80 %
Platelets: 188 10*3/uL (ref 150–400)
RBC: 3.44 MIL/uL — ABNORMAL LOW (ref 4.22–5.81)
RDW: 18.1 % — ABNORMAL HIGH (ref 11.5–15.5)
WBC: 7.8 10*3/uL (ref 4.0–10.5)
nRBC: 0 % (ref 0.0–0.2)

## 2021-09-29 LAB — RESP PANEL BY RT-PCR (FLU A&B, COVID) ARPGX2
Influenza A by PCR: NEGATIVE
Influenza B by PCR: NEGATIVE
SARS Coronavirus 2 by RT PCR: NEGATIVE

## 2021-09-29 LAB — PHOSPHORUS: Phosphorus: 5.8 mg/dL — ABNORMAL HIGH (ref 2.5–4.6)

## 2021-09-29 LAB — TROPONIN I (HIGH SENSITIVITY)
Troponin I (High Sensitivity): 44 ng/L — ABNORMAL HIGH (ref <18)
Troponin I (High Sensitivity): 41 ng/L — ABNORMAL HIGH (ref <18)

## 2021-09-29 LAB — MAGNESIUM: Magnesium: 2.7 mg/dL — ABNORMAL HIGH (ref 1.7–2.4)

## 2021-09-29 LAB — PREALBUMIN: Prealbumin: 18.1 mg/dL (ref 18–38)

## 2021-09-29 LAB — BRAIN NATRIURETIC PEPTIDE: B Natriuretic Peptide: 4476.4 pg/mL — ABNORMAL HIGH (ref 0.0–100.0)

## 2021-09-29 MED ORDER — DILTIAZEM HCL ER COATED BEADS 180 MG PO CP24
300.0000 mg | ORAL_CAPSULE | Freq: Every day | ORAL | Status: DC
  Administered 2021-09-29 – 2021-10-01 (×3): 300 mg via ORAL
  Filled 2021-09-29 (×4): qty 1

## 2021-09-29 MED ORDER — SEVELAMER CARBONATE 800 MG PO TABS
2400.0000 mg | ORAL_TABLET | Freq: Three times a day (TID) | ORAL | Status: DC
  Administered 2021-09-29 – 2021-10-01 (×5): 2400 mg via ORAL
  Filled 2021-09-29 (×5): qty 3

## 2021-09-29 MED ORDER — NALOXONE HCL 0.4 MG/ML IJ SOLN: 0.4000 mg | INTRAMUSCULAR | Status: DC | PRN

## 2021-09-29 MED ORDER — FUROSEMIDE 10 MG/ML IJ SOLN
40.0000 mg | Freq: Once | INTRAMUSCULAR | Status: AC
Start: 2021-09-29 — End: 2021-09-29
  Administered 2021-09-29: 40 mg via INTRAVENOUS
  Filled 2021-09-29: qty 4

## 2021-09-29 MED ORDER — CHLORHEXIDINE GLUCONATE CLOTH 2 % EX PADS
6.0000 | MEDICATED_PAD | Freq: Every day | CUTANEOUS | Status: DC
  Administered 2021-09-29 – 2021-09-30 (×2): 6 via TOPICAL

## 2021-09-29 MED ORDER — ENSURE ENLIVE PO LIQD
237.0000 mL | Freq: Two times a day (BID) | ORAL | Status: DC
  Administered 2021-09-29 – 2021-10-01 (×3): 237 mL via ORAL
  Filled 2021-09-29: qty 237

## 2021-09-29 MED ORDER — NITROGLYCERIN 0.4 MG SL SUBL: 0.4000 mg | SUBLINGUAL_TABLET | SUBLINGUAL | Status: DC | PRN

## 2021-09-29 MED ORDER — FENTANYL CITRATE PF 50 MCG/ML IJ SOSY
50.0000 ug | PREFILLED_SYRINGE | Freq: Once | INTRAMUSCULAR | Status: AC
  Administered 2021-09-29: 50 ug via INTRAVENOUS
  Filled 2021-09-29: qty 1

## 2021-09-29 MED ORDER — NITROGLYCERIN 0.4 MG SL SUBL
0.4000 mg | SUBLINGUAL_TABLET | SUBLINGUAL | Status: AC | PRN
  Administered 2021-09-29 (×3): 0.4 mg via SUBLINGUAL
  Filled 2021-09-29 (×2): qty 1

## 2021-09-29 MED ORDER — NITROGLYCERIN 0.1 MG/HR TD PT24
0.1000 mg | MEDICATED_PATCH | Freq: Every day | TRANSDERMAL | Status: DC
  Administered 2021-09-29 – 2021-10-01 (×3): 0.1 mg via TRANSDERMAL
  Filled 2021-09-29 (×3): qty 1

## 2021-09-29 MED ORDER — ACETAMINOPHEN 500 MG PO TABS
1000.0000 mg | ORAL_TABLET | Freq: Once | ORAL | Status: AC
  Administered 2021-09-29: 1000 mg via ORAL
  Filled 2021-09-29: qty 2

## 2021-09-29 MED ORDER — ACETAMINOPHEN 650 MG RE SUPP: 650.0000 mg | Freq: Four times a day (QID) | RECTAL | Status: DC | PRN

## 2021-09-29 MED ORDER — APIXABAN 2.5 MG PO TABS
2.5000 mg | ORAL_TABLET | Freq: Two times a day (BID) | ORAL | Status: DC
  Administered 2021-09-29 – 2021-10-01 (×5): 2.5 mg via ORAL
  Filled 2021-09-29 (×5): qty 1

## 2021-09-29 MED ORDER — TRAZODONE HCL 50 MG PO TABS
50.0000 mg | ORAL_TABLET | Freq: Every day | ORAL | Status: DC
  Administered 2021-09-29 – 2021-09-30 (×2): 50 mg via ORAL
  Filled 2021-09-29 (×2): qty 1

## 2021-09-29 MED ORDER — RENA-VITE PO TABS
1.0000 | ORAL_TABLET | Freq: Every day | ORAL | Status: DC
  Administered 2021-09-29 – 2021-09-30 (×2): 1 via ORAL
  Filled 2021-09-29 (×2): qty 1

## 2021-09-29 MED ORDER — FENTANYL CITRATE PF 50 MCG/ML IJ SOSY: 50.0000 ug | PREFILLED_SYRINGE | INTRAMUSCULAR | Status: DC | PRN

## 2021-09-29 MED ORDER — ACETAMINOPHEN 325 MG PO TABS
650.0000 mg | ORAL_TABLET | Freq: Four times a day (QID) | ORAL | Status: DC | PRN
  Administered 2021-09-29 – 2021-09-30 (×2): 650 mg via ORAL
  Filled 2021-09-29 (×2): qty 2

## 2021-09-29 NOTE — Progress Notes (Addendum)
RT just notified that pt is going to need BIPAP. RN stated she thought it would be easier to start BIPAP once pt was admitted to floor and settled. Receiving RT notified of need for BIPAP.   -BIPAP order changed from Continuous to PRN due to improvement in work of breathing and BIPAP cannot be worn while in Dialysis. RT will monitor as needed.

## 2021-09-29 NOTE — ED Triage Notes (Signed)
Pt BIB [facility] from home c/o weakness, SHOB, CP. Per EMS pt O2 sats 71% RA, pt normally on 2L [place]. Pt 100% on NRB. A&Ox4

## 2021-09-29 NOTE — Progress Notes (Signed)
°   09/29/21 0618  Assess: MEWS Score  Temp 97.6 F (36.4 C)  BP (!) 142/98  Pulse Rate (!) 122  ECG Heart Rate (!) 123  Resp (!) 22  Level of Consciousness Alert  SpO2 100 %  O2 Device Bi-PAP  Assess: MEWS Score  MEWS Temp 0  MEWS Systolic 0  MEWS Pulse 2  MEWS RR 1  MEWS LOC 0  MEWS Score 3  MEWS Score Color Yellow  Treat  Pain Scale 0-10  Pain Score 4  Pain Type Acute pain  Pain Location Chest (pleuritic chest pain)  Pain Descriptors / Indicators Pressure  Pain Frequency Intermittent  Pain Onset Gradual  Patients Stated Pain Goal 2  Pain Intervention(s) Other (Comment) (BiPAP applied by respiratory and pain improving)  Take Vital Signs  Increase Vital Sign Frequency  Yellow: Q 2hr X 2 then Q 4hr X 2, if remains yellow, continue Q 4hrs  Escalate  MEWS: Escalate Yellow: discuss with charge nurse/RN and consider discussing with provider and RRT  Notify: Charge Nurse/RN  Name of Charge Nurse/RN Notified Jill, RN  Date Charge Nurse/RN Notified 09/29/21  Time Charge Nurse/RN Notified [phone]  Document  Patient Outcome Stabilized after interventions  Progress note created (see row info) Yes

## 2021-09-29 NOTE — Procedures (Signed)
° °  I was present at this dialysis session, have reviewed the session itself and made  appropriate changes [name] MD [place] pager [phone]   09/29/2021, 11:11 AM

## 2021-09-29 NOTE — ED Provider Notes (Addendum)
Emergency Department Provider Note  I have reviewed the triage vital signs and the nursing notes.  HISTORY  Chief Complaint Shortness of Breath   HPI Thomas Robinson is a 61 y.o. male with medical history of aortic stenosis, asthma, end-stage renal disease on dialysis (states compliance) who presents emerged from today secondary to shortness of breath.  Patient is admitted multiple times a year for noncompliance and pulmonary edema fluid overload.  He is states has been compliant with his dialysis this last week and states he last had it on Saturday.  Had worsening shortness of breath tonight EMS was called sats were in the 70s put him on nonrebreather brought him here for further evaluation.  Patient without fever or productive cough.  No other associate symptoms  PMH Past Medical History:  Diagnosis Date   Anaphylactic shock, unspecified, sequela 06/10/2019   Aortic insufficiency    Aortic stenosis    Arthritis    Asthma, chronic, unspecified asthma severity, with acute exacerbation 10/23/2017   CAP (community acquired pneumonia) 10/23/2017   Carpal tunnel syndrome of right wrist 10/05/2018   Cataract    right - removed by surgery   Cerebral infarction due to thrombosis of cerebral artery (HCC)    Cervical disc herniation 01/04/2020   Chronic diastolic heart failure ([place]) 04/13/2018   Chronic low back pain 11/24/2019   Constipation    COPD (chronic obstructive pulmonary disease) (HCC)    Cough    chronic cough   Encounter for immunization 07/08/2017   ESRD on hemodialysis ([place]) 02/16/2018   Fall 06/04/2019   GERD (gastroesophageal reflux disease) 06/04/2019   GI bleeding 05/24/2017   Gram-negative sepsis, unspecified ([place]) 09/05/2017   History of fusion of cervical spine 03/26/2020   Hyperlipidemia    Hypertension    Hypokalemia 06/04/2017   Hypotension    Iron deficiency anemia, unspecified 09/09/2017   Left shoulder pain 11/11/2019   Lumbar radiculopathy  11/24/2019   Lung contusion 06/04/2019   LVH (left ventricular hypertrophy) due to hypertensive disease, with heart failure ([place]) 06/18/2020   Macrocytic anemia 05/24/2017   Mitral regurgitation    Moderate protein-calorie malnutrition ([place]) 06/06/2017   Myofascial pain syndrome 06/12/2020   Neuritis of right ulnar nerve 09/13/2018   Non-compliance with renal dialysis ([place]) 02/08/2020   Oxygen deficiency 12/30/2019   O2 sats on RA 87% at PAT appt    Pain in joint of right elbow 09/13/2018   Paroxysmal atrial flutter (HCC)    PEA (Pulseless electrical activity) ([place])    after sedation in 07/2021   Pulmonary hypertension ([place])    Renovascular hypertension 06/22/2020   Restless leg syndrome    Rib fractures 06/2019   Right   S/P cardiac cath 08/27/2020   normal coronary arteries.   Secondary hyperparathyroidism of renal origin ([place]) 06/04/2017   Thoracic ascending aortic aneurysm    Tricuspid regurgitation    Ulnar neuropathy 10/05/2018   Volume overload 10/23/2017    Home Medications Prior to Admission medications   Medication Sig Start Date End Date Taking? Authorizing Provider  albuterol (VENTOLIN HFA) 108 (90 Base) MCG/ACT inhaler Inhale 2 puffs into the lungs 2 (two) times daily as needed for shortness of breath or wheezing. 06/01/21   Oswald Hillock, MD  apixaban (ELIQUIS) 2.5 MG TABS tablet Take 1 tablet (2.5 mg total) by mouth 2 (two) times daily. 09/13/21 10/13/21  Elgergawy, Silver Huguenin, MD  cinacalcet (SENSIPAR) 60 MG tablet Take 1 tablet (60 mg total) by mouth  every evening. Patient not taking: Reported on 09/19/2021 06/01/21   Oswald Hillock, MD  diltiazem (CARDIZEM CD) 300 MG 24 hr capsule Take 1 capsule (300 mg total) by mouth daily. 09/21/21   Ghimire, Henreitta Leber, MD  feeding supplement (ENSURE ENLIVE / ENSURE PLUS) LIQD Take 237 mLs by mouth 2 (two) times daily between meals. 09/13/21   Elgergawy, Silver Huguenin, MD  fluticasone furoate-vilanterol (BREO ELLIPTA) 100-25 MCG/ACT AEPB  Inhale 1 puff into the lungs daily as needed (For shortness of breath).    [provider]  midodrine (PROAMATINE) 10 MG tablet Take 1 tablet (10 mg total) by mouth 3 (three) times daily with meals. 09/13/21   Elgergawy, Silver Huguenin, MD  multivitamin (RENA-VIT) TABS tablet Take 1 tablet by mouth at bedtime. 01/12/18   [provider]  sevelamer carbonate (RENVELA) 800 MG tablet Take 800 mg by mouth 3 (three) times daily with meals. 05/31/21   [provider]    Social History Social History   Tobacco Use   Smoking status: Never   Smokeless tobacco: Never  Vaping Use   Vaping Use: Never used  Substance Use Topics   Alcohol use: Not Currently    Alcohol/week: 0.0 standard drinks    Comment: has a drink once in a while- 12/30/19   Drug use: No    Review of Systems: Documented in HPI ____________________________________________  PHYSICAL EXAM: VITAL SIGNS: ED Triage Vitals  Enc Vitals Group     BP 09/29/21 0323 (!) 164/106     Pulse Rate 09/29/21 0323 (!) 123     Resp 09/29/21 0323 (!) 30     Temp 09/29/21 0323 (!) 97.4 F (36.3 C)     Temp Source 09/29/21 0323 Axillary     SpO2 09/29/21 0319 (!) 75 %     Weight 09/29/21 0325 97 lb (44 kg)     Height 09/29/21 0325 5\' 8"  (1.727 m)   Physical Exam Vitals and nursing note reviewed.  Constitutional:      Appearance: He is well-developed.  HENT:     Head: Normocephalic and atraumatic.  Cardiovascular:     Rate and Rhythm: Normal rate.  Pulmonary:     Effort: Pulmonary effort is normal. No respiratory distress.     Breath sounds: Examination of the right-lower field reveals rales. Examination of the left-lower field reveals rales. Decreased breath sounds and rales present.  Abdominal:     General: There is no distension.  Musculoskeletal:        General: Normal range of motion.     Cervical back: Normal range of motion.  Skin:    General: Skin is warm and dry.  Neurological:     Mental Status: He is  alert.      ____________________________________________   LABS (all labs ordered are listed, but only abnormal results are displayed)  Labs Reviewed  CBC WITH DIFFERENTIAL/PLATELET - Abnormal; Notable for the following components:      Result Value   RBC 3.44 (*)    Hemoglobin 11.1 (*)    HCT 35.6 (*)    MCV 103.5 (*)    RDW 18.1 (*)    All other components within normal limits  COMPREHENSIVE METABOLIC PANEL - Abnormal; Notable for the following components:   Glucose, Bld 100 (*)    BUN 63 (*)    Creatinine, Ser 7.55 (*)    Calcium 7.9 (*)    Albumin 3.2 (*)    Alkaline Phosphatase 146 (*)  GFR, Estimated 8 (*)    Anion gap 17 (*)    All other components within normal limits  BRAIN NATRIURETIC PEPTIDE - Abnormal; Notable for the following components:   B Natriuretic Peptide 4,476.4 (*)    All other components within normal limits  TROPONIN I (HIGH SENSITIVITY) - Abnormal; Notable for the following components:   Troponin I (High Sensitivity) 44 (*)    All other components within normal limits  RESP PANEL BY RT-PCR (FLU A&B, COVID) [id number]  PROCALCITONIN  TROPONIN I (HIGH SENSITIVITY)   ____________________________________________  EKG   EKG Interpretation  Date/Time:  Sunday September 29 2021 03:34:05 EST Ventricular Rate:  122 PR Interval:  147 QRS Duration: 113 QT Interval:  333 QTC Calculation: 475 R Axis:   217 Text Interpretation: Sinus tachycardia Borderline intraventricular conduction delay Abnormal R-wave progression, late transition Minimal ST elevation, inferior leads Confirmed by Merrily Pew [phone]) on 09/29/2021 6:14:52 AM        ____________________________________________  RADIOLOGY  DG Chest Portable 1 View  Result Date: 09/29/2021 CLINICAL DATA:  EGA. EXAM: PORTABLE CHEST 1 VIEW COMPARISON:  Ten days ago FINDINGS: Diffuse interstitial opacity with cephalized blood flow and moderate right pleural effusion. Dialysis catheter on the right  with tip at the lower SVC. Cardiomegaly. No pneumothorax. IMPRESSION: Pulmonary edema and right pleural effusion. Electronically Signed   By: Jorje Guild M.D.   On: 09/29/2021 04:53   ____________________________________________  PROCEDURES  Procedure(s) performed:   .Critical Care Performed by: Merrily Pew, MD Authorized by: Merrily Pew, MD   Critical care provider statement:    Critical care time (minutes):  30   Critical care was necessary to treat or prevent imminent or life-threatening deterioration of the following conditions:  Respiratory failure   Critical care was time spent personally by me on the following activities:  Development of treatment plan with patient or surrogate, discussions with consultants, evaluation of patient's response to treatment, examination of patient, ordering and review of laboratory studies, ordering and review of radiographic studies, ordering and performing treatments and interventions, pulse oximetry, re-evaluation of patient's condition and review of old charts ____________________________________________  INITIAL IMPRESSION / ASSESSMENT AND PLAN   This patient presents to the ED for concern of shortness of breath and chest pain, this involves an extensive number of treatment options, and is a complaint that carries with it a high risk of complications and morbidity.  The differential diagnosis includes pulmonary edema, pulm embolus, pneumonia, ACS. As he is dialysis patient with a history of noncompliance we will start with chest x-ray and continue oxygenate him.  I suspect this will likely reveal pulmonary edema however this is noncontributory then will broaden the work-up.  Additional history obtained:  Additional history obtained from EMS Previous records obtained and reviewed in Epic  Co morbidities that complicate the patient evaluation  Asthma, h/o cardiac death  Social Determinants of Health:  Noncompliant with medical  treatments.   ED Course  Images ordered viewed and obtained by myself. Agree with Radiology interpretation. Details in ED course.  Labs ordered reviewed by myself as detailed in ED course.  Consultations obtained/considered detailed in ED course.   Chest x-ray viewed by myself and appears to have small pleural effusions and bilateral pulmonary edema.  Less likely to be pneumonia without a fever or productive cough.  Apparently his oxygenati is unchanged but his work of breathing is worsened.  Also having persistent chest pain described as tightness likely related to constrictive breathing from the  fluid overload.  Orders all this pain medicine started on BiPAP.  Has already had nitroglycerin.  He does make urine once a while so we will try dose of Lasix as well.  Discussed with Dr. Jonnie Finner with nephrology will see the patient for urgent dialysis request Discussed with Dr. Velia Meyer with [facility] who will admit after dialysis      Cardiac Monitoring:  The patient was maintained on a cardiac monitor.  I personally viewed and interpreted the cardiac monitored which showed an underlying rhythm of: Sinus tachycardia  CRITICAL INTERVENTIONS:  Oxygenation BiPAP Urgent dialysis  Reevaluation:  After the interventions noted above, I reevaluated the patient and found that they have :improved  Patient's work of breathing improved with the pain medicine possibility was related to the chest constriction.  FINAL IMPRESSION AND PLAN Final diagnoses:  Hypoxia    Medical screening exam was performed and I feel the patient has had appropriate emergency department evaluation and work-up for their chief complaint and is stable for ADMISSION to the hospitalist time.  I discussed with Dr. Velia Meyer with the [facility] service and discussed labs, imaging and other work-up in the emergency room.  They agree to admission for further management and work-up of said condition.    ____________________________________________   NEW OUTPATIENT MEDICATIONS STARTED DURING THIS VISIT:  Current Discharge Medication List      Note:  This note was prepared with assistance of Dragon voice recognition software. Occasional wrong-word or sound-a-like substitutions may have occurred due to the inherent limitations of voice recognition software.    Fidel Caggiano, Corene Cornea, MD 09/29/21 [id number]    Merrily Pew, MD 09/29/21 [phone]

## 2021-09-29 NOTE — Consult Note (Signed)
Renal Service Consult Note [facility]  Thomas Robinson 09/29/2021 Thomas Blazing, MD Requesting Physician: Dr. Starla Robinson  Reason for Consult: ESRD pt w/ SOB, resp distress HPI: The patient is a 61 y.o. year-old w/ h xof asthma, CVA, COPD, chronic pain, ESRD on HD, PEA arrest (after sedation 07/2021), GIB, PAF, pulm HTN, recurrent pleural effusions, vol overload presented to ED w/ worsening SOB, last HD was 09/27/21 (MWF pt). Last echo showed normal LVEF 60-65%, hx LHC showed no sig CAD. Hx of parox atrial fib. In ED CXR showed pulm edema, moderate R effusion ^'d in size from 1/19. Pt rec'd IV lasix 40mg , fentanyl 50 ug and tylenol 1 gm po. Asked to see for ESRD.    Pt seen in room. On bipap and can't give much verbal history.  Requesting dialysis today.   ROS - denies CP, no joint pain, no HA, no blurry vision, no rash, no diarrhea, no nausea/ vomiting   Past Medical History  Past Medical History:  Diagnosis Date   Anaphylactic shock, unspecified, sequela 06/10/2019   Aortic insufficiency    Aortic stenosis    Arthritis    Asthma, chronic, unspecified asthma severity, with acute exacerbation 10/23/2017   CAP (community acquired pneumonia) 10/23/2017   Carpal tunnel syndrome of right wrist 10/05/2018   Cataract    right - removed by surgery   Cerebral infarction due to thrombosis of cerebral artery (HCC)    Cervical disc herniation 01/04/2020   Chronic diastolic heart failure ([place]) 04/13/2018   Chronic low back pain 11/24/2019   Constipation    COPD (chronic obstructive pulmonary disease) (HCC)    Cough    chronic cough   Encounter for immunization 07/08/2017   ESRD on hemodialysis ([place]) 02/16/2018   Fall 06/04/2019   GERD (gastroesophageal reflux disease) 06/04/2019   GI bleeding 05/24/2017   Gram-negative sepsis, unspecified ([place]) 09/05/2017   History of fusion of cervical spine 03/26/2020   Hyperlipidemia    Hypertension    Hypokalemia 06/04/2017    Hypotension    Iron deficiency anemia, unspecified 09/09/2017   Left shoulder pain 11/11/2019   Lumbar radiculopathy 11/24/2019   Lung contusion 06/04/2019   LVH (left ventricular hypertrophy) due to hypertensive disease, with heart failure ([place]) 06/18/2020   Macrocytic anemia 05/24/2017   Mitral regurgitation    Moderate protein-calorie malnutrition ([place]) 06/06/2017   Myofascial pain syndrome 06/12/2020   Neuritis of right ulnar nerve 09/13/2018   Non-compliance with renal dialysis ([place]) 02/08/2020   Oxygen deficiency 12/30/2019   O2 sats on RA 87% at PAT appt    Pain in joint of right elbow 09/13/2018   Paroxysmal atrial flutter (HCC)    PEA (Pulseless electrical activity) ([place])    after sedation in 07/2021   Pulmonary hypertension ([place])    Renovascular hypertension 06/22/2020   Restless leg syndrome    Rib fractures 06/2019   Right   S/P cardiac cath 08/27/2020   normal coronary arteries.   Secondary hyperparathyroidism of renal origin ([place]) 06/04/2017   Thoracic ascending aortic aneurysm    Tricuspid regurgitation    Ulnar neuropathy 10/05/2018   Volume overload 10/23/2017   Past Surgical History  Past Surgical History:  Procedure Laterality Date   A/V FISTULAGRAM N/A 01/01/2021   Procedure: A/V FISTULAGRAM;  Surgeon: Thomas Mitchell, MD;  Location: [place] [facility];  Service: Cardiovascular;  Laterality: N/A;   A/V FISTULAGRAM N/A 01/15/2021   Procedure: A/V [id number];  Surgeon: Thomas Mitchell, MD;  Location: [place] [facility];  Service: Cardiovascular;  Laterality: N/A;   ANTERIOR CERVICAL DECOMP/DISCECTOMY FUSION N/A 01/04/2020   Procedure: ANTERIOR CERVICAL DECOMPRESSION/DISCECTOMY FUSION CERVICAL FIVE THROUGH SEVEN;  Surgeon: Thomas Schools, MD;  Location: [place];  Service: Orthopedics;  Laterality: N/A;  3 hrs   AV FISTULA PLACEMENT Left 05/28/2017   Procedure: LEFT ARM ARTERIOVENOUS (AV) FISTULA CREATION;  Surgeon: Thomas Milwaukie, MD;  Location: [place];  Service:  Vascular;  Laterality: Left;   AV FISTULA PLACEMENT Left 02/02/2021   Procedure: Debride left forearm, ligation of left upper arm Aretiovenous fistula;  Surgeon: Thomas Dutch, MD;  Location: [place];  Service: Vascular;  Laterality: Left;   AV FISTULA PLACEMENT Right 04/04/2021   Procedure: RIGHT ARTERIOVENOUS (AV) FISTULA CREATION;  Surgeon: Thomas Mitchell, MD;  Location: [place] [facility];  Service: Vascular;  Laterality: Right;   BIOPSY  07/10/2021   Procedure: BIOPSY;  Surgeon: Thomas Bullion, DO;  Location: [place] [facility];  Service: Gastroenterology;;   COLONOSCOPY WITH PROPOFOL N/A 07/10/2021   Procedure: COLONOSCOPY WITH PROPOFOL;  Surgeon: Thomas Bullion, DO;  Location: [place];  Service: Gastroenterology;  Laterality: N/A;   ESOPHAGOGASTRODUODENOSCOPY (EGD) WITH PROPOFOL N/A 07/10/2021   Procedure: ESOPHAGOGASTRODUODENOSCOPY (EGD) WITH PROPOFOL;  Surgeon: Thomas Bullion, DO;  Location: [place];  Service: Gastroenterology;  Laterality: N/A;   EYE SURGERY Right 06/02/2019   Cataract removed   FRACTURE SURGERY     INSERTION OF DIALYSIS CATHETER Right 02/02/2021   Procedure: INSERTION OF Right Internal jugular TUNNELED  DIALYSIS CATHETER;  Surgeon: Thomas Dutch, MD;  Location: [facility];  Service: Vascular;  Laterality: Right;   IR DIALY SHUNT INTRO NEEDLE/INTRACATH INITIAL W/IMG LEFT Left 09/12/2020   IR FLUORO GUIDE CV LINE RIGHT  05/25/2017   IR FLUORO GUIDE CV LINE RIGHT  07/17/2021   IR THORACENTESIS ASP PLEURAL SPACE W/IMG GUIDE  09/11/2021   IR US GUIDE VASC ACCESS LEFT  09/12/2020   IR US GUIDE VASC ACCESS RIGHT  05/25/2017   IR US GUIDE VASC ACCESS RIGHT  07/17/2021   LIGATION OF ARTERIOVENOUS  FISTULA Left 01/10/2021   Procedure: LIGATION OF LEFT ARM RADIOCEPHALIC FISTULA;  Surgeon: Thomas Mitchell, MD;  Location: [facility];  Service: Vascular;  Laterality: Left;   PERIPHERAL VASCULAR BALLOON ANGIOPLASTY Left 01/15/2021   Procedure: PERIPHERAL VASCULAR BALLOON ANGIOPLASTY;   Surgeon: Thomas Mitchell, MD;  Location: [place] [facility];  Service: Cardiovascular;  Laterality: Left;  AVF   REVISON OF ARTERIOVENOUS FISTULA Left 07/18/2019   Procedure: REVISION PLICATION OF RADIOCEPHALIC ARTERIOVENOUS FISTULA LEFT ARM;  Surgeon: Angelia Mould, MD;  Location: [facility];  Service: Vascular;  Laterality: Left;   REVISON OF ARTERIOVENOUS FISTULA Left 01/10/2021   Procedure: CONVERSION TO BRACHIOCEPHALIC ARTERIOVENOUS FISTULA;  Surgeon: Thomas Mitchell, MD;  Location: [facility];  Service: Vascular;  Laterality: Left;   RINOPLASTY     Family History  Family History  Problem Relation Age of Onset   Cancer Mother    Social History  reports that he has never smoked. He has never used smokeless tobacco. He reports that he does not currently use alcohol. He reports that he does not use drugs. Allergies  Allergies  Allergen Reactions   Vancomycin Shortness Of Breath and Itching   Home medications Prior to Admission medications   Medication Sig Start Date End Date Taking? Authorizing Provider  albuterol (VENTOLIN HFA) 108 (90 Base) MCG/ACT inhaler Inhale 2 puffs into the lungs 2 (two) times  daily as needed for shortness of breath or wheezing. 06/01/21   Oswald Hillock, MD  apixaban (ELIQUIS) 2.5 MG TABS tablet Take 1 tablet (2.5 mg total) by mouth 2 (two) times daily. 09/13/21 10/13/21  Elgergawy, Silver Huguenin, MD  cinacalcet (SENSIPAR) 60 MG tablet Take 1 tablet (60 mg total) by mouth every evening. Patient not taking: Reported on 09/19/2021 06/01/21   Oswald Hillock, MD  diltiazem (CARDIZEM CD) 300 MG 24 hr capsule Take 1 capsule (300 mg total) by mouth daily. 09/21/21   Ghimire, Henreitta Leber, MD  feeding supplement (ENSURE ENLIVE / ENSURE PLUS) LIQD Take 237 mLs by mouth 2 (two) times daily between meals. 09/13/21   Elgergawy, Silver Huguenin, MD  fluticasone furoate-vilanterol (BREO ELLIPTA) 100-25 MCG/ACT AEPB Inhale 1 puff into the lungs daily as needed (For shortness of breath).    [provider]  midodrine (PROAMATINE) 10 MG tablet Take 1 tablet (10 mg total) by mouth 3 (three) times daily with meals. 09/13/21   Elgergawy, Silver Huguenin, MD  multivitamin (RENA-VIT) TABS tablet Take 1 tablet by mouth at bedtime. 01/12/18   [provider]  sevelamer carbonate (RENVELA) 800 MG tablet Take 800 mg by mouth 3 (three) times daily with meals. 05/31/21   [provider]     Vitals:   09/29/21 0445 09/29/21 0500 09/29/21 0530 09/29/21 0618  BP: 135/89 (!) 149/91 (!) 150/98 (!) 142/98  Pulse: (!) 123 (!) 121 (!) 119 (!) 122  Resp: (!) 25 (!) 27 (!) 31 (!) 22  Temp:    97.6 F (36.4 C)  TempSrc:    Axillary  SpO2: 100% 96% 95% 100%  Weight:    50.1 kg  Height:    5\' 8"  (1.727 m)   Exam Gen alert, no distress No rash, cyanosis or gangrene Sclera anicteric, throat clear  No jvd or bruits Chest dec'd R base, L base clear RRR no MRG Abd soft ntnd no mass or ascites +bs GU normal MS no joint effusions or deformity Ext no LE or UE edema, no wounds or ulcers Neuro is alert, Ox 3 , nf      Home meds include - eliquis, sensipar, cardizem cd 300, breo ellipta, midodrine 10 tid, renvela 1 ac, rena-vit, prns/ vits / supps     OP HD: [facility] MWF  3.5h  400/500  ?  2/2 bath  RIJ TDC  - pending   Assessment/ Plan: SOB/ resp distress / pulm edema - likely vol overload and/or R effusion worsening. BP's are up. This time CXR looks more like edema.  Plan HD this am.   ESRD - usual HD MWF. Last HD was on schedule 1/27. HD today as above.  Volume - likely overloaded. Max UF 3.5 L UFG today.  BP - chronic intermittent hypotension on midodrine 10 tid Chronic resp failure - is on home O2 COPD MBD ckd - Ca in range, get records, cont renvela Anemia ckd - Hb 11, no esa needed Parox atrial fib - on eliquis and cardizem      [name]  MD 09/29/2021, 7:08 AM  Recent Labs  Lab 09/29/21 0344  WBC 7.8  HGB 11.1*   Recent Labs  Lab 09/29/21 0344  K 4.9  BUN  63*  CREATININE 7.55*  ALBUMIN 3.2*  CALCIUM 7.9*

## 2021-09-29 NOTE — ED Notes (Signed)
Pt requesting medication for his chest and back. MD Mesner notified.

## 2021-09-29 NOTE — H&P (Signed)
History and Physical    PLEASE NOTE THAT DRAGON DICTATION SOFTWARE WAS USED IN THE CONSTRUCTION OF THIS NOTE.   [name] HVF:[id number] DOB: 01-Dec-1960 DOA: 09/29/2021  PCP: [name], PA  Patient coming from: home   I have personally briefly reviewed patient's old medical records in [place]  Chief Complaint: Shortness of breath  HPI: Thomas Robinson is a 61 y.o. male with medical history significant for chronic hypoxic respiratory failure on 2 to 2.5 L continuous nasal cannula, chronic diastolic heart failure, end-stage renal disease on hemodialysis on Monday, Wednesday, Friday schedule, hypertension, paroxysmal atrial fibrillation chronically anticoagulated on Eliquis, chronically elevated troponin, who is admitted to [facility] on 09/29/2021 with acute on chronic hypoxic respiratory failure due to acute on chronic diastolic heart failure after presenting from home to [facility] ED complaining of shortness of breath.   The following history is provided via my discussions with the patient as well as my discussions with the EDP as well as via chart review.  The patient has been hospitalized multiple times over the last month for acute on chronic hypoxic respiratory failure due to acute on chronic diastolic heart failure.  This evening, the patient reports 2 days of progressive shortness of breath associated with orthopnea.  He also notes substernal chest pressure on an intermittent basis over the last day, which has been nonexertional, nonpositional, nonpleuritic, and not reproducible with direct palpation of the anterior chest wall.  He states that the above presentation, including that of the substernal chest pressure is very similar to that which she has experienced at times of his multiple prior hospitalizations over the course of the last month for acute on chronic hypoxic respiratory failure due to acute on chronic diastolic heart failure.  Denies any associated  diaphoresis, palpitations, nausea, vomiting, presyncope, or syncope.  Denies any recent cough, wheezing, mopped assist, new lower extremity erythema, or calf tenderness. Denies any recent trauma, travel, surgical procedures, or periods of prolonged diminished ambulatory status.  He also denies any associated subjective fever, chills, rigors, or generalized myalgias. No recent headache, neck stiffness, rhinitis, rhinorrhea, sore throat, abdominal pain, diarrhea, or rash.   He acknowledges a history of end-stage renal disease for which he reports good compliance with his hemodialysis schedule, which is Monday, Wednesday, Friday schedule, noting that he attended his most recent routine scheduled hemodialysis session, on Friday, 09/27/2021.  He notes that his presenting shortness of breath started after this most recent hemodialysis session, and has progressed in intensity since that time.  Per chart review, he also has a history of chronic diastolic heart failure, with most recent echocardiogram on 08/12/2021 notable for LVEF 66 5%, severe LVH, grade 1 diastolic dysfunction, severely dilated bilateral atria, small pericardial effusion, without evidence of tamponade physiology, as well as mild to moderate tricuspid regurgitation.  In spite of end-stage renal disease on hemodialysis, the patient reports that he still does produce a small amount of urine.  Denies any recent dysuria or gross hematuria.  He also acknowledges a history of paroxysmal atrial fibrillation for which she reports good compliance with his chronic anticoagulation on Eliquis.  Per chart review, the patient has a chronically elevated BNP, with BNP values found to be not below 4000 since June 2019.      ED Course:  Vital signs in the ED were notable for the following: Afebrile; heart rate 1 22-1 23; blood pressure 135/89 - 155/92; respiratory rate 23-30, oxygen saturation initially 75% on baseline 2  L nasal cannula, with ensuing  improvement to 100% on 15 L nonrebreather.  Labs were notable for the following: CMP notable for the following: Sodium 140, potassium 4.9, bicarbonate 23, anion gap 17, calcium corrected for mild hypoalbuminemia 8.6, albumin 3.2.  CBC notable for white blood cell count 7800, hemoglobin 11.1.  BNP 4476 compared to greater than 4500 on 09/19/2021.  High-sensitivity troponin I 44 compared to most recent prior value of 46 on 09/19/2021.  COVID-19/influenza PCR were checked in the ED this evening, with results currently pending.  Imaging and additional notable ED work-up: EKG shows sinus tachycardia with heart rate 122, no evidence of T wave or ST changes, including no evidence of ST elevation.  Chest x-ray shows evidence of pulmonary edema, moderate right pleural effusion, increased in size from a small right pleural effusion on 09/19/2021, while demonstrating no evidence of infiltrate or pneumothorax.  EDP discussed the patient's case with the on-call nephrologist, Dr. Jonnie Finner, Who will formally consult and is planning for hemodialysis later today.  While in the ED, the following were administered: Acetaminophen 1 g p.o. x1, fentanyl 50 mcg IV x1, Lasix 40 mg IV x1.    Subsequently, the patient is admitted to the PCU for further evaluation and management of presenting acute on chronic hypoxic respiratory failure in the setting of suspected acute on chronic diastolic heart failure.     Review of Systems: As per HPI otherwise 10 point review of systems negative.   Past Medical History:  Diagnosis Date   Anaphylactic shock, unspecified, sequela 06/10/2019   Aortic insufficiency    Aortic stenosis    Arthritis    Asthma, chronic, unspecified asthma severity, with acute exacerbation 10/23/2017   CAP (community acquired pneumonia) 10/23/2017   Carpal tunnel syndrome of right wrist 10/05/2018   Cataract    right - removed by surgery   Cerebral infarction due to thrombosis of cerebral artery (HCC)     Cervical disc herniation 01/04/2020   Chronic diastolic heart failure ([place]) 04/13/2018   Chronic low back pain 11/24/2019   Constipation    COPD (chronic obstructive pulmonary disease) (HCC)    Cough    chronic cough   Encounter for immunization 07/08/2017   ESRD on hemodialysis ([place]) 02/16/2018   Fall 06/04/2019   GERD (gastroesophageal reflux disease) 06/04/2019   GI bleeding 05/24/2017   Gram-negative sepsis, unspecified ([place]) 09/05/2017   History of fusion of cervical spine 03/26/2020   Hyperlipidemia    Hypertension    Hypokalemia 06/04/2017   Hypotension    Iron deficiency anemia, unspecified 09/09/2017   Left shoulder pain 11/11/2019   Lumbar radiculopathy 11/24/2019   Lung contusion 06/04/2019   LVH (left ventricular hypertrophy) due to hypertensive disease, with heart failure ([place]) 06/18/2020   Macrocytic anemia 05/24/2017   Mitral regurgitation    Moderate protein-calorie malnutrition ([place]) 06/06/2017   Myofascial pain syndrome 06/12/2020   Neuritis of right ulnar nerve 09/13/2018   Non-compliance with renal dialysis ([place]) 02/08/2020   Oxygen deficiency 12/30/2019   O2 sats on RA 87% at PAT appt    Pain in joint of right elbow 09/13/2018   Paroxysmal atrial flutter (HCC)    PEA (Pulseless electrical activity) ([place])    after sedation in 07/2021   Pulmonary hypertension ([place])    Renovascular hypertension 06/22/2020   Restless leg syndrome    Rib fractures 06/2019   Right   S/P cardiac cath 08/27/2020   normal coronary arteries.   Secondary hyperparathyroidism of renal origin (  [place]) 06/04/2017   Thoracic ascending aortic aneurysm    Tricuspid regurgitation    Ulnar neuropathy 10/05/2018   Volume overload 10/23/2017    Past Surgical History:  Procedure Laterality Date   A/V FISTULAGRAM N/A 01/01/2021   Procedure: A/V FISTULAGRAM;  Surgeon: Serafina Mitchell, MD;  Location: [place] [facility];  Service: Cardiovascular;  Laterality: N/A;   A/V FISTULAGRAM N/A  01/15/2021   Procedure: A/V [id number];  Surgeon: Serafina Mitchell, MD;  Location: [place] [facility];  Service: Cardiovascular;  Laterality: N/A;   ANTERIOR CERVICAL DECOMP/DISCECTOMY FUSION N/A 01/04/2020   Procedure: ANTERIOR CERVICAL DECOMPRESSION/DISCECTOMY FUSION CERVICAL FIVE THROUGH SEVEN;  Surgeon: Melina Schools, MD;  Location: [place];  Service: Orthopedics;  Laterality: N/A;  3 hrs   AV FISTULA PLACEMENT Left 05/28/2017   Procedure: LEFT ARM ARTERIOVENOUS (AV) FISTULA CREATION;  Surgeon: Conrad Tolar, MD;  Location: [place];  Service: Vascular;  Laterality: Left;   AV FISTULA PLACEMENT Left 02/02/2021   Procedure: Debride left forearm, ligation of left upper arm Aretiovenous fistula;  Surgeon: Elam Dutch, MD;  Location: [place];  Service: Vascular;  Laterality: Left;   AV FISTULA PLACEMENT Right 04/04/2021   Procedure: RIGHT ARTERIOVENOUS (AV) FISTULA CREATION;  Surgeon: Serafina Mitchell, MD;  Location: [place] OR;  Service: Vascular;  Laterality: Right;   BIOPSY  07/10/2021   Procedure: BIOPSY;  Surgeon: Lavena Bullion, DO;  Location: [place] [facility];  Service: Gastroenterology;;   COLONOSCOPY WITH PROPOFOL N/A 07/10/2021   Procedure: COLONOSCOPY WITH PROPOFOL;  Surgeon: Lavena Bullion, DO;  Location: [place];  Service: Gastroenterology;  Laterality: N/A;   ESOPHAGOGASTRODUODENOSCOPY (EGD) WITH PROPOFOL N/A 07/10/2021   Procedure: ESOPHAGOGASTRODUODENOSCOPY (EGD) WITH PROPOFOL;  Surgeon: Lavena Bullion, DO;  Location: [place];  Service: Gastroenterology;  Laterality: N/A;   EYE SURGERY Right 06/02/2019   Cataract removed   FRACTURE SURGERY     INSERTION OF DIALYSIS CATHETER Right 02/02/2021   Procedure: INSERTION OF Right Internal jugular TUNNELED  DIALYSIS CATHETER;  Surgeon: Elam Dutch, MD;  [facility]: [facility];  Service: Vascular;  Laterality: Right;   IR DIALY SHUNT INTRO NEEDLE/INTRACATH INITIAL W/IMG LEFT Left 09/12/2020   IR FLUORO GUIDE CV LINE RIGHT  05/25/2017   IR  FLUORO GUIDE CV LINE RIGHT  07/17/2021   IR THORACENTESIS ASP PLEURAL SPACE W/IMG GUIDE  09/11/2021   IR US GUIDE VASC ACCESS LEFT  09/12/2020   IR US GUIDE VASC ACCESS RIGHT  05/25/2017   IR US GUIDE VASC ACCESS RIGHT  07/17/2021   LIGATION OF ARTERIOVENOUS  FISTULA Left 01/10/2021   Procedure: LIGATION OF LEFT ARM RADIOCEPHALIC FISTULA;  Surgeon: Serafina Mitchell, MD;  Location: [facility];  Service: Vascular;  Laterality: Left;   PERIPHERAL VASCULAR BALLOON ANGIOPLASTY Left 01/15/2021   Procedure: PERIPHERAL VASCULAR BALLOON ANGIOPLASTY;  Surgeon: Serafina Mitchell, MD;  Location: [place] [facility];  Service: Cardiovascular;  Laterality: Left;  AVF   REVISON OF ARTERIOVENOUS FISTULA Left 07/18/2019   Procedure: REVISION PLICATION OF RADIOCEPHALIC ARTERIOVENOUS FISTULA LEFT ARM;  Surgeon: Angelia Mould, MD;  Location: [place];  Service: Vascular;  Laterality: Left;   REVISON OF ARTERIOVENOUS FISTULA Left 01/10/2021   Procedure: CONVERSION TO BRACHIOCEPHALIC ARTERIOVENOUS FISTULA;  Surgeon: Serafina Mitchell, MD;  Location: [facility];  Service: Vascular;  Laterality: Left;   RINOPLASTY      Social History:  reports that he has never smoked. He has never used smokeless tobacco. He reports that he  does not currently use alcohol. He reports that he does not use drugs.   Allergies  Allergen Reactions   Vancomycin Shortness Of Breath and Itching    Family History  Problem Relation Age of Onset   Cancer Mother     Family history reviewed and not pertinent    Prior to Admission medications   Medication Sig Start Date End Date Taking? Authorizing Provider  albuterol (VENTOLIN HFA) 108 (90 Base) MCG/ACT inhaler Inhale 2 puffs into the lungs 2 (two) times daily as needed for shortness of breath or wheezing. 06/01/21   Oswald Hillock, MD  apixaban (ELIQUIS) 2.5 MG TABS tablet Take 1 tablet (2.5 mg total) by mouth 2 (two) times daily. 09/13/21 10/13/21  Elgergawy, Silver Huguenin, MD  cinacalcet (SENSIPAR) 60  MG tablet Take 1 tablet (60 mg total) by mouth every evening. Patient not taking: Reported on 09/19/2021 06/01/21   Oswald Hillock, MD  diltiazem (CARDIZEM CD) 300 MG 24 hr capsule Take 1 capsule (300 mg total) by mouth daily. 09/21/21   Ghimire, Henreitta Leber, MD  feeding supplement (ENSURE ENLIVE / ENSURE PLUS) LIQD Take 237 mLs by mouth 2 (two) times daily between meals. 09/13/21   Elgergawy, Silver Huguenin, MD  fluticasone furoate-vilanterol (BREO ELLIPTA) 100-25 MCG/ACT AEPB Inhale 1 puff into the lungs daily as needed (For shortness of breath).    [provider]  midodrine (PROAMATINE) 10 MG tablet Take 1 tablet (10 mg total) by mouth 3 (three) times daily with meals. 09/13/21   Elgergawy, Silver Huguenin, MD  multivitamin (RENA-VIT) TABS tablet Take 1 tablet by mouth at bedtime. 01/12/18   [provider]  sevelamer carbonate (RENVELA) 800 MG tablet Take 800 mg by mouth 3 (three) times daily with meals. 05/31/21   [provider]     Objective    Physical Exam: Vitals:   09/29/21 0430 09/29/21 0445 09/29/21 0500 09/29/21 0530  BP: (!) 149/95 135/89 (!) 149/91 (!) 150/98  Pulse: (!) 122 (!) 123 (!) 121 (!) 119  Resp: (!) 34 (!) 25 (!) 27 (!) 31  Temp:      TempSrc:      SpO2: 100% 100% 96% 95%  Weight:      Height:        General: appears to be stated age; alert, oriented; mildly increased work of breathing noted Skin: warm, dry, no rash Head:  AT/[place] Mouth:  Oral mucosa membranes appear moist, normal dentition Neck: supple; trachea midline Heart:  RRR; did not appreciate any M/R/G Lungs: Slightly diminished right basilar breath sound, but otherwise CTAB, did not appreciate any wheezes, rales, or rhonchi Abdomen: + BS; soft, ND, NT Vascular: 2+ pedal pulses b/l; 2+ radial pulses b/l Extremities: no peripheral edema, no muscle wasting Neuro: strength and sensation intact in upper and lower extremities b/l    Labs on Admission: I have personally reviewed following labs  and imaging studies  CBC: Recent Labs  Lab 09/29/21 0344  WBC 7.8  NEUTROABS 6.3  HGB 11.1*  HCT 35.6*  MCV 103.5*  PLT 160   Basic Metabolic Panel: Recent Labs  Lab 09/29/21 0344  NA 140  K 4.9  CL 100  CO2 23  GLUCOSE 100*  BUN 63*  CREATININE 7.55*  CALCIUM 7.9*   GFR: Estimated Creatinine Clearance: 6.3 mL/min (A) (by C-G formula based on SCr of 7.55 mg/dL (H)). Liver Function Tests: Recent Labs  Lab 09/29/21 0344  AST 17  ALT 11  ALKPHOS 146*  BILITOT 0.9  PROT 7.3  ALBUMIN 3.2*   No results for input(s): LIPASE, AMYLASE in the last 168 hours. No results for input(s): AMMONIA in the last 168 hours. Coagulation Profile: No results for input(s): INR, PROTIME in the last 168 hours. Cardiac Enzymes: No results for input(s): CKTOTAL, CKMB, CKMBINDEX, TROPONINI in the last 168 hours. BNP (last 3 results) No results for input(s): PROBNP in the last 8760 hours. HbA1C: No results for input(s): HGBA1C in the last 72 hours. CBG: No results for input(s): GLUCAP in the last 168 hours. Lipid Profile: No results for input(s): CHOL, HDL, LDLCALC, TRIG, CHOLHDL, LDLDIRECT in the last 72 hours. Thyroid Function Tests: No results for input(s): TSH, T4TOTAL, FREET4, T3FREE, THYROIDAB in the last 72 hours. Anemia Panel: No results for input(s): VITAMINB12, FOLATE, FERRITIN, TIBC, IRON, RETICCTPCT in the last 72 hours. Urine analysis:    Component Value Date/Time   COLORURINE YELLOW 05/24/2017 1729   APPEARANCEUR HAZY (A) 05/24/2017 1729   LABSPEC 1.011 05/24/2017 1729   PHURINE 5.0 05/24/2017 1729   GLUCOSEU 50 (A) 05/24/2017 1729   HGBUR SMALL (A) 05/24/2017 1729   BILIRUBINUR NEGATIVE 05/24/2017 1729   KETONESUR NEGATIVE 05/24/2017 1729   PROTEINUR 100 (A) 05/24/2017 1729   UROBILINOGEN 0.2 04/09/2015 1527   NITRITE NEGATIVE 05/24/2017 1729   LEUKOCYTESUR TRACE (A) 05/24/2017 1729    Radiological Exams on Admission: DG Chest Portable 1 View  Result Date:  09/29/2021 CLINICAL DATA:  EGA. EXAM: PORTABLE CHEST 1 VIEW COMPARISON:  Ten days ago FINDINGS: Diffuse interstitial opacity with cephalized blood flow and moderate right pleural effusion. Dialysis catheter on the right with tip at the lower SVC. Cardiomegaly. No pneumothorax. IMPRESSION: Pulmonary edema and right pleural effusion. Electronically Signed   By: Jorje Guild M.D.   On: 09/29/2021 04:53     EKG: Independently reviewed, with result as described above.    Assessment/Plan   Principal Problem:   Acute on chronic respiratory failure with hypoxia (HCC) Active Problems:   Atypical chest pain   Acute on chronic diastolic CHF (congestive heart failure) (HCC)   End stage renal disease (HCC)   Shortness of breath   Protein-calorie malnutrition, severe   AF (paroxysmal atrial fibrillation) (HCC)      #) Acute on chronic hypoxic respiratory failure due to acute on chronic diastolic heart failure: In the setting of a documented history of chronic hypoxic respiratory failure on 2 to 2.5 L continuous nasal cannula, the patient presents with 2 days of progressive shortness of breath and initial oxygen saturations in the mid 70s on his baseline 2 L nasal cannula, subsequently improving to 100% on 15 L nonrebreather.  This appears to be on the basis of acute on chronic diastolic heart failure in the setting of associated orthopnea with chest x-ray showing evidence of pulmonary edema and interval increase in right-sided pleural effusion to that of moderate size.  This is in the context of a known history of chronic diastolic heart failure, with most recent echocardiogram performed on 08/12/2021, with findings notable for grade 1 diastolic dysfunction, with additional details as noted above. Etiology leading to presenting acutely decompensated heart failure is currently unclear, particularly given patient's report of good compliance with attending his scheduled hemodialysis sessions in the setting  of ERSD, while noting multiple similar hospitalizations over the course of the last month.  In spite of incisional disease, the patient reports that he does produce a small amount of urine, but is not on any scheduled diuretic  medications at home.  Of note, patient has a history of chronic bleeding elevated troponin, with presenting high-sensitivity troponin high on par with that of his chronic elevation, with presenting value actually trending down slightly relative to most recent prior, as quantified above.  Overall, ACS leading to presenting acutely decompensated heart failure appears less likely at this time in the absence of any acute ischemic changes on presenting EKG as well as non-elevated troponin relative to baseline.   EDP discussed case with on-call nephrologist, Dr. Jonnie Finner, Will formally consult, and is planning for hemodialysis later today to assist with this picture of volume overload. Will also closely monitor ensuing HR and response to this management for suspected acute volume overload.   In terms of other etiologies considered as potential contributing factors leading to his presenting acute on chronic hypoxic respiratory failure, acute pulmonary embolism appears clinically less likely, particularly given patient's poor to good compliance via chronic anticoagulation on Eliquis.  Clinically and radiographically, presentation less suggestive of pneumonia.  However, will check procalcitonin to further evaluate.   Received Lasix 40 mg IV x1 in the ED this evening.    Plan: monitor strict I's & O's and daily weights. Monitor on telemetry. Monitor continuous pulse oximetry. Check serum magnesium level.  Nephrology consulted, as above, with plan for HD later today.  Trend troponin.  Add on procalcitonin.       #) Atypical chest pain: Patient reports 1 day of substernal chest pressure that is nonexertional in nature, and is very similar in location, quality, intensity, relative to the  atypical chest pain that the patient is experienced at the time of his multiple previous hospitalizations for acute on chronic hypoxic respiratory failure in the setting of acute on chronic diastolic heart failure over the course of the last month.  ACS appears less likely, as further described above, including with high-sensitivity troponin on par with his chronic elevation, with this evening's value trending down relative to most recent prior, as well as in the context of EKG showing no evidence of acute ischemic changes, as further detailed above.  Acute PE less likely given chronic anticoagulation on Eliquis.  Chest x-ray, as described above, including no evidence of pneumothorax.  Plan: Prn sublingual nitroglycerin.  As needed fentanyl for pain not relieved by nitroglycerin.  Monitor on telemetry.  Continue to trend troponin.  Further evaluation management of acute on chronic hypoxic respiratory failure due to acute on chronic diastolic heart failure, including plans for hemodialysis, as further detailed above.         #) ESRD: on HD (schedule: Monday, Wednesday, Friday schedule).  Most recently attended hemodialysis session on Friday, 09/27/2021.  However, in the setting of presenting acute volume overload due to acute on chronic diastolic heart failure, EDP has discussed the patient's case with on-call nephrology, Dr. Joneen Caraway, who will formally consult, and is planning for HD later today, as further described above.  Otherwise, no evidence of hyperkalemia, anion gap metabolic acidosis, or uremia.    Plan: monitor strict I's/O's, daily weights. CMP in the AM. Check mag and phos levels.  Nephrology formally consulted, as above.        #) Protein calorie malnutrition: Documented history of such, with the patient on supplementation via Ensure on a twice daily basis as an outpatient.  Plan: Check prealbumin to further assess severity of associated protein calorie malnutrition.  Continue home  Ensure supplementation.         #) Paroxysmal atrial fibrillation: Documented history of  such. In setting of CHA2DS2-VASc score of 5, there is an indication for chronic anticoagulation for thromboembolic prophylaxis. Consistent with this, patient is chronically anticoagulated on Eliquis. Home AV nodal blocking regimen: Diltiazem.  Most recent echocardiogram was performed on 08/12/2021, as further detailed above. Presenting EKG demonstrates sinus tachycardia.    Plan: monitor strict I's & O's and daily weights. Repeat BMP/CBC in AM. Check serum mag level. Continue home AV nodal blocking regimen.  Continue outpatient Eliquis.  In the setting of acute on chronic diastolic heart failure, will also monitor on telemetry.      DVT prophylaxis: Continue home Eliquis Code Status: Full code(confirmed per my discussions with the patient this evening) Family Communication: none Disposition Plan: Per Rounding Team Consults called: Case was discussed with the on-call nephrologist, Dr. Jonnie Finner, Who will formally consult as further detailed above.;  Admission status: Inpatient; PCU  Warrants inpatient status on basis of need for further evaluation and management of presenting acute on chronic hypoxic respiratory failure including need for delivery of heightened level of supplemental oxygen delivery given oxygen saturations initially in the 70s on his baseline 2 to 3 L nasal cannula, as well as need for associated intervention that includes expedited hemodialysis, as further detailed above.   PLEASE NOTE THAT DRAGON DICTATION SOFTWARE WAS USED IN THE CONSTRUCTION OF THIS NOTE.   [place] DO [facility]  From [place]   09/29/2021, 5:46 AM

## 2021-09-29 NOTE — Progress Notes (Signed)
Patient ID: Thomas Robinson, male   DOB: 08/24/1961, 61 y.o.   MRN: [id number] Patient admitted early this morning for shortness of breath and is currently on BiPAP.  Chest x-ray showing pulmonary edema.  Nephrology has been consulted and is planning for hemodialysis this morning.  Patient seen and examined at bedside.  I have reviewed patient's medical records including this morning's H&P, current vitals, labs, medications myself.  Follow further nephrology recommendations.  Apparently, he needs to come off BiPAP to be dialyzed.  We will try facemask and see if he tolerates it.

## 2021-09-30 DIAGNOSIS — I5033 Acute on chronic diastolic (congestive) heart failure: Secondary | ICD-10-CM | POA: Diagnosis not present

## 2021-09-30 DIAGNOSIS — N186 End stage renal disease: Secondary | ICD-10-CM | POA: Diagnosis not present

## 2021-09-30 DIAGNOSIS — J9621 Acute and chronic respiratory failure with hypoxia: Secondary | ICD-10-CM | POA: Diagnosis not present

## 2021-09-30 DIAGNOSIS — Z66 Do not resuscitate: Secondary | ICD-10-CM

## 2021-09-30 DIAGNOSIS — Z515 Encounter for palliative care: Secondary | ICD-10-CM | POA: Diagnosis not present

## 2021-09-30 DIAGNOSIS — Z7189 Other specified counseling: Secondary | ICD-10-CM

## 2021-09-30 DIAGNOSIS — R0789 Other chest pain: Secondary | ICD-10-CM | POA: Diagnosis not present

## 2021-09-30 LAB — BASIC METABOLIC PANEL WITH GFR
Anion gap: 14 (ref 5–15)
BUN: 42 mg/dL — ABNORMAL HIGH (ref 8–23)
CO2: 26 mmol/L (ref 22–32)
Calcium: 8 mg/dL — ABNORMAL LOW (ref 8.9–10.3)
Chloride: 97 mmol/L — ABNORMAL LOW (ref 98–111)
Creatinine, Ser: 5.26 mg/dL — ABNORMAL HIGH (ref 0.61–1.24)
GFR, Estimated: 12 mL/min — ABNORMAL LOW
Glucose, Bld: 98 mg/dL (ref 70–99)
Potassium: 4.5 mmol/L (ref 3.5–5.1)
Sodium: 137 mmol/L (ref 135–145)

## 2021-09-30 MED ORDER — CHLORHEXIDINE GLUCONATE CLOTH 2 % EX PADS: 6.0000 | MEDICATED_PAD | Freq: Every day | CUTANEOUS | Status: DC

## 2021-09-30 NOTE — Progress Notes (Signed)
Patient ID: Thomas Robinson, male   DOB: 10/28/1960, 61 y.o.   MRN: [id number]  PROGRESS NOTE    Thomas Robinson  ZHY:[id number] DOB: 01/06/1961 DOA: 09/29/2021 PCP: Thomas Niece, PA   Brief Narrative:  61 year old male with history of chronic hypoxic respiratory failure on 2 to 2.5 L oxygen via nasal cannula, chronic diastolic heart failure, end-stage renal disease on hemodialysis, hypertension, paroxysmal A. fib on Eliquis and multiple recent hospitalizations for respiratory failure/pulmonary edema with most recent discharge on 09/19/2021 presented with worsening shortness of breath.  On presentation, he was saturating 75% on 2 L nasal cannula oxygen and was placed on 15 L nonrebreather.  BNP of 4476; high-sensitivity troponin 44.  Chest x-ray showed evidence of pulmonary edema, moderate right pleural effusion with no evidence of infiltrate or pneumothorax.  Nephrology was consulted.  He underwent hemodialysis on 09/29/2021.  Assessment & Plan:   Acute on chronic hypoxic respiratory failure Pulmonary edema Acute on chronic diastolic heart failure End-stage renal disease on hemodialysis -Normally wears 2 to 2.5 L oxygen via nasal cannula.  Presented with worsening hypoxia and was requiring 15 L nonrebreather on presentation with chest x-ray showing pulmonary edema and BNP of 4476 (has chronically elevated BNP).  He also briefly required BiPAP. -Nephrology following: Underwent hemodialysis on 09/29/2021.  Follow further nephrology recommendations regarding need for further hemodialysis today. -Respiratory status improving.  Currently on 4 L oxygen via nasal cannula. -COVID-19/influenza testing were negative on presentation -Strict input and output.  Daily weights.  Fluid restriction.  Atypical chest pain -Possibly from above.  High sensitive troponin did not trend up.  EKG without any acute ischemic changes. -Currently has nitroglycerin patch.  DC nitroglycerin patch and monitor. -Currently  chest pain-free.  Severe protein calorie malnutrition/cachexia -Consult nutrition  Generalized deconditioning Multiple recurrent admissions History of noncompliance -Overall prognosis is very poor.  Palliative care has been consulted.  CODE STATUS has been changed to DNR by palliative care team this morning.  Paroxysmal A. Fib -Currently rate controlled.  Continue Cardizem and apixaban.  Hypertension -Blood pressure intermittently elevated.  Continue Cardizem.   DVT prophylaxis: Apixaban Code Status: DNR Family Communication: None at bedside Disposition Plan: Status is: Inpatient  Remains inpatient appropriate because: Of severity of illness.  Still  requiring more than normal oxygen supplementation  Consultants: Nephrology/palliative care  Procedures: None  Antimicrobials: None   Subjective: Patient seen and examined at bedside.  Breathing is much better.  No overnight fever, nausea, vomiting reported.  Objective: Vitals:   09/29/21 1703 09/29/21 2043 09/30/21 0305 09/30/21 0500  BP:  (!) 148/77 (!) 163/70   Pulse:  87 68   Resp:  20 17   Temp:  97.9 F (36.6 C) 97.8 F (36.6 C)   TempSrc:  Oral Oral   SpO2: 95% 94% 97%   Weight:    52.1 kg  Height:        Intake/Output Summary (Last 24 hours) at 09/30/2021 0807 Last data filed at 09/29/2021 1234 Gross per 24 hour  Intake --  Output 3220 ml  Net -3220 ml   Filed Weights   09/29/21 0930 09/29/21 1250 09/30/21 0500  Weight: 51 kg 47.8 kg 52.1 kg    Examination:  General exam: Appears calm and comfortable.  Looks chronically ill and deconditioned.  Currently on 4 L oxygen via nasal cannula.  Extremely thinly built. Respiratory system: Bilateral decreased breath sounds at bases with scattered crackles Cardiovascular system: S1 & S2 heard, Rate controlled Gastrointestinal system:  Abdomen is nondistended, soft and nontender. Normal bowel sounds heard. Extremities: No cyanosis, clubbing; trace lower  extremity edema Central nervous system: Alert and oriented.  Slow to respond.  No focal neurological deficits. Moving extremities Skin: No rashes, lesions or ulcers Psychiatry: Affect is mostly flat.  No signs of agitation currently  Data Reviewed: I have personally reviewed following labs and imaging studies  CBC: Recent Labs  Lab 09/29/21 0344  WBC 7.8  NEUTROABS 6.3  HGB 11.1*  HCT 35.6*  MCV 103.5*  PLT 149   Basic Metabolic Panel: Recent Labs  Lab 09/29/21 0344 09/29/21 0634 09/30/21 0323  NA 140  --  137  K 4.9  --  4.5  CL 100  --  97*  CO2 23  --  26  GLUCOSE 100*  --  98  BUN 63*  --  42*  CREATININE 7.55*  --  5.26*  CALCIUM 7.9*  --  8.0*  MG  --  2.7*  --   PHOS  --  5.8*  --    GFR: Estimated Creatinine Clearance: 10.7 mL/min (A) (by C-G formula based on SCr of 5.26 mg/dL (H)). Liver Function Tests: Recent Labs  Lab 09/29/21 0344  AST 17  ALT 11  ALKPHOS 146*  BILITOT 0.9  PROT 7.3  ALBUMIN 3.2*   No results for input(s): LIPASE, AMYLASE in the last 168 hours. No results for input(s): AMMONIA in the last 168 hours. Coagulation Profile: No results for input(s): INR, PROTIME in the last 168 hours. Cardiac Enzymes: No results for input(s): CKTOTAL, CKMB, CKMBINDEX, TROPONINI in the last 168 hours. BNP (last 3 results) No results for input(s): PROBNP in the last 8760 hours. HbA1C: No results for input(s): HGBA1C in the last 72 hours. CBG: No results for input(s): GLUCAP in the last 168 hours. Lipid Profile: No results for input(s): CHOL, HDL, LDLCALC, TRIG, CHOLHDL, LDLDIRECT in the last 72 hours. Thyroid Function Tests: No results for input(s): TSH, T4TOTAL, FREET4, T3FREE, THYROIDAB in the last 72 hours. Anemia Panel: No results for input(s): VITAMINB12, FOLATE, FERRITIN, TIBC, IRON, RETICCTPCT in the last 72 hours. Sepsis Labs: Recent Labs  Lab 09/29/21 0535  PROCALCITON 0.54    Recent Results (from the past 240 hour(s))  Resp  Panel by RT-PCR (Flu A&B, Covid) Nasopharyngeal Swab     Status: None   Collection Time: 09/29/21  4:56 AM   Specimen: Nasopharyngeal Swab; Nasopharyngeal(NP) swabs in vial transport medium  Result Value Ref Range Status   SARS Coronavirus 2 by RT PCR NEGATIVE NEGATIVE Final    Comment: (NOTE) SARS-CoV-2 target nucleic acids are NOT DETECTED.  The SARS-CoV-2 RNA is generally detectable in upper respiratory specimens during the acute phase of infection. The lowest concentration of SARS-CoV-2 viral copies this assay can detect is 138 copies/mL. A negative result does not preclude SARS-Cov-2 infection and should not be used as the sole basis for treatment or other patient management decisions. A negative result may occur with  improper specimen collection/handling, submission of specimen other than nasopharyngeal swab, presence of viral mutation(s) within the areas targeted by this assay, and inadequate number of viral copies(<138 copies/mL). A negative result must be combined with clinical observations, patient history, and epidemiological information. The expected result is Negative.  Fact Sheet for Patients:  EntrepreneurPulse.com.au  Fact Sheet for Healthcare Providers:  IncredibleEmployment.be  This test is no t yet approved or cleared by the [place] FDA and  has been authorized for detection and/or diagnosis of SARS-CoV-2  by FDA under an Emergency Use Authorization (EUA). This EUA will remain  in effect (meaning this test can be used) for the duration of the COVID-19 declaration under Section 564(b)(1) of the Act, 21 U.S.C.section 360bbb-3(b)(1), unless the authorization is terminated  or revoked sooner.       Influenza A by PCR NEGATIVE NEGATIVE Final   Influenza B by PCR NEGATIVE NEGATIVE Final    Comment: (NOTE) The Xpert Xpress SARS-CoV-2/FLU/RSV plus assay is intended as an aid in the diagnosis of influenza from Nasopharyngeal  swab specimens and should not be used as a sole basis for treatment. Nasal washings and aspirates are unacceptable for Xpert Xpress SARS-CoV-2/FLU/RSV testing.  Fact Sheet for Patients: EntrepreneurPulse.com.au  Fact Sheet for Healthcare Providers: IncredibleEmployment.be  This test is not yet approved or cleared by the [place] FDA and has been authorized for detection and/or diagnosis of SARS-CoV-2 by FDA under an Emergency Use Authorization (EUA). This EUA will remain in effect (meaning this test can be used) for the duration of the COVID-19 declaration under Section 564(b)(1) of the Act, 21 U.S.C. section 360bbb-3(b)(1), unless the authorization is terminated or revoked.  Performed at [facility], [place] [address] [place], [place] [address]          Radiology Studies: DG Chest Portable 1 View  Result Date: 09/29/2021 CLINICAL DATA:  EGA. EXAM: PORTABLE CHEST 1 VIEW COMPARISON:  Ten days ago FINDINGS: Diffuse interstitial opacity with cephalized blood flow and moderate right pleural effusion. Dialysis catheter on the right with tip at the lower SVC. Cardiomegaly. No pneumothorax. IMPRESSION: Pulmonary edema and right pleural effusion. Electronically Signed   By: Jorje Guild M.D.   On: 09/29/2021 04:53        Scheduled Meds:  apixaban  2.5 mg Oral BID   Chlorhexidine Gluconate Cloth  6 each Topical Q0600   diltiazem  300 mg Oral Daily   feeding supplement  237 mL Oral BID BM   multivitamin  1 tablet Oral QHS   nitroGLYCERIN  0.1 mg Transdermal Daily   sevelamer carbonate  2,400 mg Oral TID WC   traZODone  50 mg Oral QHS   Continuous Infusions:        Aline August, MD [facility] 09/30/2021, 8:07 AM

## 2021-09-30 NOTE — Progress Notes (Addendum)
PT Cancellation Note  Patient Details Name: Thomas Robinson MRN: [id number] DOB: September 18, 1960   Cancelled Treatment:    Reason Eval/Treat Not Completed: Patient declined acute PT saying he would resume his PT at home and is leaving tomorrow. Not interested in acute PT. Pt well known to me from prior admissions. Will sign off.    [place] 09/30/2021, 11:00 AM H. Rivera Colon Pager [phone] Office [phone]

## 2021-09-30 NOTE — Progress Notes (Signed)
Mobility Specialist Progress Note    09/30/21 1517  Mobility  Activity Off unit   Pt at HD. Will f/u as schedule permits.   [facility] Mobility Specialist  M.S. 2C and 6E: [phone] M.S. 4E: [phone]

## 2021-09-30 NOTE — Consult Note (Signed)
Palliative Medicine Inpatient Consult Note  Consulting Provider: Aline August, MD  Reason for consult:   [facility] Palliative Medicine Consult  Reason for Consult? Goals of care   HPI:  Per intake H&P --> : Thomas Robinson is a 61 y.o. male with medical history significant for chronic hypoxic respiratory failure on 2 to 2.5 L continuous nasal cannula, chronic diastolic heart failure, end-stage renal disease on hemodialysis on Monday, Wednesday, Friday schedule, hypertension, paroxysmal atrial fibrillation chronically anticoagulated on Eliquis, chronically elevated troponin, who is admitted to [facility] on 09/29/2021 with acute on chronic hypoxic respiratory failure due to acute on chronic diastolic heart failure after presenting from home to [facility] ED complaining of shortness of breath.   Palliative care has been consulted to further address goals of care in the setting of known noncompliance and 10 inpatient admissions within a 72-monthperiod of time.    Of note palliative care saw Thomas Mainin November at which time a MOST was completed and he was identified as a DNR.  Clinical Assessment/Goals of Care:  *Please note that this is a verbal dictation therefore any spelling or grammatical errors are due to the "DSpringfieldOne" system interpretation.  I have reviewed medical records including EPIC notes, labs and imaging, received report from bedside RN, assessed the patient who is lying in bed in no acute distress.   I met with Thomas Robinson further discuss diagnosis prognosis, GOC, EOL wishes, disposition and options.   I introduced Palliative Medicine as specialized medical care for people living with serious illness. It focuses on providing relief from the symptoms and stress of a serious illness. The goal is to improve quality of life for both the patient and the family.  We reviewed that Thomas Robinson with one of my colleagues back in November and he  described that experience as "being told by the grieving repair that he was going to die".  He shares that after that he "got up and got out of here".  Expressed that we do highlight patient's chronic medical diseases though at the end of the day her goals are to affirm what is important to the patient and try to enable them to live out the best quality of life that they can.  Medical History Review and Understanding:  Thomas Mainand I reviewed his medical history of diastolic heart failure, end-stage renal disease which he receives dialysis Monday Wednesday and Friday, atrial fibrillation, and hypoxic respiratory failure for which she remains on 2-1/2 L of oxygen in the home.  Thomas Mainexpresses that his health has not been good and he emphasizes that he has not been terribly compliant with all measures to improve his health.  He now shares a willingness to comply with dietary recommendations as well as exercise recommendations.  Social History:  [place]  He was married but is since divorced.  He has 1 daughter who he is not very close with.  He does not have any grandchildren.  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  Functional and Nutritional State:  Thomas Mainlives in a single-family home with a friend, Thomas Baller  Robinson.  He uses a front wheel walker for mobility though is limited by his oxygen.  He shares that he does not presently have a portable oxygen tank though this is in the works.  [name] is is appetite has been variable and he does endorse noncompliance with restrictions.  He is willing to meet with a dietitian to have a better  idea of what he should and should not eat moving forward.  Palliative Symptoms:  Shortness of breath-patient would benefit from low-dose opoid (dilaudid given patients renal dysfunction) as needed to better aid in support this symptom which does seem to be occurring relatively frequently.  The complicating factor associated with this is the fact that the patient's diastolic heart failure is poorly managed due to noncompliant actions.  Advance Directives: A detailed discussion was had today regarding advanced directives.  Advanced directives are on file in patient's good friend Thomas Robinson, "Thomas Robinson" is identified as the primary surrogate decision maker.   Code Status: Concepts specific to code status, artifical feeding and hydration, continued IV antibiotics and rehospitalization was had.  We reviewed patient's prior MOST form which was filled out in November 2022 which he is in agreement with as below:  Cardiopulmonary Resuscitation: Do Not Attempt Resuscitation (DNR/No CPR)  Medical Interventions: Limited Additional Interventions: Use medical treatment, IV fluids and cardiac monitoring as indicated, DO NOT USE intubation or mechanical ventilation. May consider use of less invasive airway support such as BiPAP or CPAP. Also provide comfort measures. Transfer to the hospital if indicated. Avoid intensive care.   Antibiotics: Antibiotics if indicated  IV Fluids: IV fluids if indicated  Feeding Tube: No feeding tube   Goals for the Future:  Thomas Robinson shares with me his goals for the future are to optimize his nutritional and muscular strength.  He realizes that he has a long road ahead of him though is optimistic that he can improve his present situation at this point in time.  Discussed recurrent readmissions and the need for additional eyes on Thomas Robinson when he leaves the hospital.  We reviewed the outpatient palliative program and how this might be of benefit to him which he is in agreement  with.  Discussed the importance of continued conversation with family and their  medical providers regarding overall plan of care and treatment options, ensuring decisions are within the context of the patients values and GOCs.  Decision Maker: Patient is able to make decisions for himself though if unable to do so he rely upon his friend, Thomas Robinson to be his primary decision maker.  SUMMARY OF RECOMMENDATIONS   DNAR/DNI  MOST and Advance Directives can be found in Vyn[employer]  Patient is amenable to outpatient palliative support  Given recurrent admissions was hopeful he would be a good candidate for the para medicine program though being that he is on hemodialysis he is not a candidate for the advanced heart failure clinic therefore would not qualify for para medicine.  Ongoing palliative management while inpatient  Code Status/Advance Care Planning: DNAR/DNI  Palliative Prophylaxis:  Aspiration, Bowel Regimen, Delirium Protocol, Frequent Pain Assessment, Oral Care, Palliative Wound Care, and Turn Reposition  Additional Recommendations (Limitations, Scope, Preferences): Treat what is treatable  Psycho-social/Spiritual:  Desire for further Chaplaincy support: Not presently Additional Recommendations: Education on chronic medical illnesses   Prognosis: Given patient's recurrent readmissions, multiple comorbid conditions, malnutrition he is at a very high risk of 49-monthmortality.  Discharge Planning: Discharge will likely be to home.  Patient is amenable to  outpatient palliative support.   Vitals:   09/29/21 2043 09/30/21 0305  BP: (!) 148/77 (!) 163/70  Pulse: 87 68  Resp: 20 17  Temp: 97.9 F (36.6 C) 97.8 F (36.6 C)  SpO2: 94% 97%    Intake/Output Summary (Last 24 hours) at 09/30/2021 6333 Last data filed at 09/29/2021 1234 Gross per 24 hour  Intake --  Output 3220 ml  Net -3220 ml   Last Weight  Most recent update: 09/30/2021  6:34 AM    Weight  52.1 kg (114  lb 13.8 oz)            Gen: Frail middle-aged male in no acute distress HEENT: moist mucous membranes CV: Regular rate and irregular rhythm PULM: On 2-1/2 L of oxygen breathing is even and nonlabored ABD: soft/nontender  EXT: Notable muscle wasting in all 4 extremities Neuro: Alert and oriented x3  PPS: 50-60%   This conversation/these recommendations were discussed with patient primary care team, Dr. Starla Link  MDM [facility]  Medical Decision Making:4 #/Complex Problems:  4                    Data Reviewed:4                 Management:4 (1-Straightforward, 2-Low, 3-Moderate, 4-High) ______________________________________________________ [facility] Palliative Medicine Team Team Cell Phone: [phone] Please utilize secure chat with additional questions, if there is no response within 30 minutes please call the above phone number  Palliative Medicine Team providers are available by phone from 7am to 7pm daily and can be reached through the team cell phone.  Should this patient require assistance outside of these hours, please call the patient's attending physician.

## 2021-09-30 NOTE — Progress Notes (Signed)
Patient in no distress at this time. Sp02=96% on 4lpm.

## 2021-09-30 NOTE — TOC Initial Note (Signed)
Transition of Care [facility]) - Initial/Assessment Note    Patient Details  Name: Thomas Robinson MRN: [id number] Date of Birth: May 30, 1961  Transition of Care [facility]) CM/SW Contact:    Bethena Roys, RN Phone Number: 09/30/2021, 11:28 AM  Clinical Narrative: Case Manager received a consult for outpatient palliative. Case Manager spoke with the patient and he declined services-MD aware. Patient is currently active with [facility] for physical therapy/occupational therapy-[facility] is aware and office to follow for additional needs. Patient will need resumption orders for services PT/OT and F2F.                  Expected Discharge Plan: [place] Barriers to Discharge: Continued Medical Work up   Patient Goals and CMS Choice Patient states their goals for this hospitalization and ongoing recovery are:: to return home with [name]   Choice offered to / list presented to : NA  Expected Discharge Plan and Services Expected Discharge Plan: [place] In-house Referral: NA   Post Acute Care Choice: [place] arrangements for the past 2 months: Apartment                 DME Arranged: N/A DME Agency: NA       HH Arranged: PT, OT HH Agency: [facility] Date [place]: 09/30/21 Time HH Agency Contacted: 1127 Representative spoke with at [place]: Amy  Prior Living Arrangements/Services Living arrangements for the past 2 months: Apartment Lives with:: Roommate Patient language and need for interpreter reviewed:: Yes Do you feel safe going back to the place where you live?: Yes      Need for Family Participation in Patient Care: Yes (Comment) Care giver support system in place?: Yes (comment) Current home services: DME (rolling walker and wheelchair) Criminal Activity/Legal Involvement Pertinent to Current Situation/Hospitalization: No - Comment as needed  Activities of Daily Living       Permission Sought/Granted Permission sought to share information with : Family Supports, Case Manager Permission granted to share information with : Yes, Verbal Permission Granted     Permission granted to share info w AGENCY: [facility]        Emotional Assessment Appearance:: Appears stated age Attitude/Demeanor/Rapport: Engaged Affect (typically observed): Appropriate Orientation: : Oriented to Self, Oriented to Place, Oriented to  Time, Oriented to Situation Alcohol / Substance Use: Not Applicable Psych Involvement: No (comment)  Admission diagnosis:  Hypoxia [R09.02] Acute on chronic respiratory failure with hypoxia ([place]) [J96.21] Patient Active Problem List   Diagnosis Date Noted   AF (paroxysmal atrial fibrillation) ([place]) 09/29/2021   Acute on chronic respiratory failure with hypoxemia due to volume overload due to noncompliance with hemodialysis  09/19/2021   Persistent atrial fibrillation ([place]) 09/19/2021   Pulmonary emboli ([place]) 09/19/2021   Cachexia ([place]) 09/19/2021   Protein-calorie malnutrition, severe 09/10/2021   Chronic respiratory failure with hypoxia ([place]) 09/07/2021   Fever 09/07/2021   Hypotension 09/07/2021   Acute on chronic diastolic (congestive) heart failure ([place]) 08/26/2021   Fluid overload 08/25/2021   Atrial fibrillation with RVR ([place]) 08/25/2021   Sacral pressure sore 08/12/2021   Shortness of breath 07/16/2021   Balanitis 07/16/2021   Vomiting    Gastritis and gastroduodenitis    Grade III hemorrhoids    Decubitus ulcer of coccyx, stage 2 ([place]) 07/08/2021   Prolonged QT interval 07/08/2021   Hematochezia    Atrial flutter ([place])    Pressure injury of skin 07/02/2021  Abnormal chest CT 06/30/2021   Recurrent pleural effusion on right 06/30/2021   SIRS (systemic inflammatory response syndrome) ([place]) 06/30/2021   Acute hypoxemic respiratory failure ([place]) 05/18/2021   Bronchitis 05/18/2021   Preoperative cardiovascular examination 03/06/2021    CHF (congestive heart failure) ([place]) 02/17/2021   Cellulitis of arm, left 02/01/2021   Cellulitis of left lower limb 02/01/2021   Cellulitis, unspecified 02/01/2021   Left arm swelling 01/25/2021   Acute and chronic respiratory failure with hypoxia ([place]) 01/13/2021   Acute on chronic respiratory failure with hypoxia ([place]) 01/12/2021   Concussion with no loss of consciousness 01/12/2021   Laceration without foreign body of left forearm, initial encounter 01/12/2021   Age-related physical debility 09/20/2020   Allergy, unspecified, initial encounter 09/20/2020   Nausea 09/20/2020   Pain, unspecified 09/20/2020   Pruritus, unspecified 09/20/2020   S/P cardiac cath 08/27/2020   Acute encephalopathy 08/26/2020   Peripheral neuropathy 08/26/2020   Elevated troponin 08/25/2020   Hyperkalemia 08/05/2020   Arthritis    Asthma    Cataract    Cerebral infarction due to thrombosis of cerebral artery ([place])    History of CVA (cerebrovascular accident)    Chronic kidney disease    Dyspnea    Hyperlipidemia    Hypertension    Kidney failure    Restless leg syndrome    Degenerative lumbar spinal stenosis 84/16/6063   Diastolic dysfunction 01/60/1093   Renovascular hypertension 06/22/2020   Demand ischemia ([place]) 06/18/2020   LVH (left ventricular hypertrophy) due to hypertensive disease, with heart failure ([place]) 06/18/2020   Myofascial pain syndrome 06/12/2020   Encounter for removal of sutures 03/29/2020   History of fusion of cervical spine 03/26/2020   Non-compliance with renal dialysis ([place]) 02/08/2020   Pulmonary edema 01/23/2020   Cervical disc herniation 01/04/2020   Tachycardia 01/04/2020   SOB (shortness of breath)    Hypoxia 12/30/2019   Chronic low back pain 11/24/2019   Degeneration of lumbar intervertebral disc 11/24/2019   Lumbar radiculopathy 11/24/2019   Left shoulder pain 11/11/2019   Anaphylactic shock, unspecified, sequela 06/10/2019   Rib fractures 06/05/2019    Fall at home, initial encounter 06/04/2019   Right rib fracture 06/04/2019   Lung contusion 06/04/2019   Acute respiratory failure with hypoxia ([place]) 06/04/2019   Anemia in ESRD (end-stage renal disease) ([place]) 06/04/2019   GERD without esophagitis 06/04/2019   Chronic diastolic (congestive) heart failure ([place]) 06/04/2019   Fall 06/04/2019   Thoracic ascending aortic aneurysm 06/04/2019   Acute exacerbation of congestive heart failure ([place]) 05/16/2019   Carpal tunnel syndrome of right wrist 10/05/2018   Ulnar neuropathy 10/05/2018   Neuritis of right ulnar nerve 09/13/2018   Pain in joint of right elbow 09/13/2018   Chest pain 04/13/2018   Chronic diastolic heart failure ([place]) 04/13/2018   Acute on chronic diastolic CHF (congestive heart failure) ([place]) 04/13/2018   Atypical chest pain 02/16/2018   ESRD on dialysis on HD MWF-but noncompliant 02/16/2018   End stage renal disease ([place]) 02/16/2018   CAP (community acquired pneumonia) 10/23/2017   Asthma, chronic, unspecified asthma severity, with acute exacerbation 10/23/2017   Respiratory failure, acute ([place]) 10/23/2017   Pulmonary hypertension ([place]) 10/23/2017   Iron deficiency anemia, unspecified 09/09/2017   Gram-negative sepsis, unspecified ([place]) 09/05/2017   Encounter for immunization 07/08/2017   Moderate protein-calorie malnutrition ([place]) 06/06/2017   Coagulation defect, unspecified ([place]) 06/04/2017   Hypokalemia 06/04/2017   Other hyperlipidemia 06/04/2017   Secondary hyperparathyroidism of renal origin ([place])  06/04/2017   Macrocytic anemia 05/24/2017   Uremia 51/83/3582   Metabolic acidosis 51/89/8421   Hypertensive urgency 05/24/2017   Acute kidney injury superimposed on CKD ([place]) 05/24/2017   CKD (chronic kidney disease), stage V ([place]) 05/24/2017   GI bleeding 05/24/2017   Anemia of chronic disease 05/24/2017   Stroke ([place]) 2018   AKI (acute kidney injury) ([place]) 04/10/2015   History of completed stroke    Essential  hypertension 04/09/2015   PCP:  [name], PA Pharmacy:   [id number] [address], [place] [address] Phone: [phone] Fax: [phone]  [facility] [place], [place] - [address], [address] [place] [address] Phone: [phone] Fax: [phone]  [facility] [address] [place] [address] Phone: [phone] Fax: [phone]   Readmission Risk Interventions Readmission Risk Prevention Plan 09/08/2021 01/15/2021 09/11/2020  Transportation Screening Complete Complete Complete  Medication Review (RN Care Manager) Complete Complete -  PCP or Specialist appointment within 3-5 days of discharge - - Not Complete  PCP/Specialist Appt Not Complete comments - - first available appt is 09/26/20  [place] or Home Care Consult Complete Complete (No Data)  SW Recovery Care/Counseling Consult Complete Complete Complete  Palliative Care Screening Not Applicable Not Applicable Not [place] Not Applicable Not Applicable Not Applicable  Some recent data might be hidden

## 2021-09-30 NOTE — Progress Notes (Signed)
[facility] Progress Note   Subjective:   Saw palliative care, wants to continue dialysis. Denies shortness of breath, CP, and dizziness. Reports he is tired, not talking much today.   Objective Vitals:   09/29/21 2043 09/30/21 0305 09/30/21 0500 09/30/21 0830  BP: (!) 148/77 (!) 163/70  123/78  Pulse: 87 68  85  Resp: 20 17  17   Temp: 97.9 F (36.6 C) 97.8 F (36.6 C)  97.7 F (36.5 C)  TempSrc: Oral Oral  Oral  SpO2: 94% 97%  90%  Weight:   52.1 kg   Height:       Physical Exam General: Frail appearing male, alert and in NAD Heart: RRR, no murmurs, rubs or gallops Lungs: On O2 via [place], decreased lung sounds in bases. No wheezing, rhonchi or rales Abdomen: Soft, non-distended, +BS Extremities: No edema b/l lower extremities Dialysis Access: R IJ [facility] with intact bandage   Additional Objective Labs: Basic Metabolic Panel: Recent Labs  Lab 09/29/21 0344 09/29/21 0634 09/30/21 0323  NA 140  --  137  K 4.9  --  4.5  CL 100  --  97*  CO2 23  --  26  GLUCOSE 100*  --  98  BUN 63*  --  42*  CREATININE 7.55*  --  5.26*  CALCIUM 7.9*  --  8.0*  PHOS  --  5.8*  --    Liver Function Tests: Recent Labs  Lab 09/29/21 0344  AST 17  ALT 11  ALKPHOS 146*  BILITOT 0.9  PROT 7.3  ALBUMIN 3.2*   No results for input(s): LIPASE, AMYLASE in the last 168 hours. CBC: Recent Labs  Lab 09/29/21 0344  WBC 7.8  NEUTROABS 6.3  HGB 11.1*  HCT 35.6*  MCV 103.5*  PLT 188   Blood Culture    Component Value Date/Time   SDES BLOOD BLOOD LEFT FOREARM 09/19/2021 1443   SPECREQUEST  09/19/2021 1443    BOTTLES DRAWN AEROBIC AND ANAEROBIC Blood Culture adequate volume   CULT  09/19/2021 1443    NO GROWTH 5 DAYS Performed at [facility], [place] [address], [place], [place] [address]    REPTSTATUS 09/24/2021 FINAL 09/19/2021 1443    Cardiac Enzymes: No results for input(s): CKTOTAL, CKMB, CKMBINDEX, TROPONINI in the last 168 hours. CBG: No results for  input(s): GLUCAP in the last 168 hours. Iron Studies: No results for input(s): IRON, TIBC, TRANSFERRIN, FERRITIN in the last 72 hours. @lablastinr3 @ Studies/Results: DG Chest Portable 1 View  Result Date: 09/29/2021 CLINICAL DATA:  EGA. EXAM: PORTABLE CHEST 1 VIEW COMPARISON:  Ten days ago FINDINGS: Diffuse interstitial opacity with cephalized blood flow and moderate right pleural effusion. Dialysis catheter on the right with tip at the lower SVC. Cardiomegaly. No pneumothorax. IMPRESSION: Pulmonary edema and right pleural effusion. Electronically Signed   By: Jorje Guild M.D.   On: 09/29/2021 04:53   Medications:   apixaban  2.5 mg Oral BID   Chlorhexidine Gluconate Cloth  6 each Topical Q0600   diltiazem  300 mg Oral Daily   feeding supplement  237 mL Oral BID BM   multivitamin  1 tablet Oral QHS   nitroGLYCERIN  0.1 mg Transdermal Daily   sevelamer carbonate  2,400 mg Oral TID WC   traZODone  50 mg Oral QHS    Dialysis Orders: [facility] on MWF 180NRe 3.5hr BFR 400 DFR 800 EDW 45.4kg 3K 2.5Ca AVF 15g  No heparin bolus Mircera 153mcg IV q 2  weeks- last dose 09/18/21   Assessment/Plan: SOB/ resp distress / pulm edema - likely vol overload and/or R effusion worsening. BP's are up. This time CXR looks more like edema.  Had HD yesterday with net UF 3.2L. Continue to titrate down volume as tolerated.  ESRD - usual HD MWF. HD today as above.  Volume/ hypertension- likely overloaded. Improved with HD yesterday. BP typically drops with dialysis but tolerated well yesterday.  COPD/ Chronic resp failure - is on home O2 MBD ckd - Calcium within goal, Phos 5.8. Resume renvela Anemia ckd - Hb 11.1, ESA last given 1/18. Parox atrial fib - on eliquis and cardizem  Anice Paganini, PA-C 09/30/2021, 10:56 AM  [facility] Pager: [phone]

## 2021-10-01 MED ORDER — NITROGLYCERIN 0.4 MG SL SUBL: 0.4000 mg | SUBLINGUAL_TABLET | SUBLINGUAL | 0 refills | Status: AC | PRN

## 2021-10-01 NOTE — Progress Notes (Signed)
Pt refused vitals. He said, "yall stop waking me up! Get the hell out." I tried to talk pt into letting me get his BP and he was rude again so I left the room like he wanted.

## 2021-10-01 NOTE — Progress Notes (Signed)
Contacted [facility] SW to make clinic aware pt d/c to home today and should resume care tomorrow.   Melven Sartorius Renal Navigator [phone]

## 2021-10-01 NOTE — Progress Notes (Signed)
Pt refused CBG bath. I educated him and he was very rude.

## 2021-10-01 NOTE — Evaluation (Signed)
Occupational Therapy Evaluation Patient Details Name: Thomas Robinson MRN: [id number] DOB: 28-Nov-1960 Today's Date: 10/01/2021   History of Present Illness 61 y/o M admitted 1/29 with R pleural effusion and SOB .PMH includes HTN, chronic diastolic heart failure , ESRD on HD HTN   Clinical Impression   Pt declines to get dressed for home. Pt states ill just wear my robe and I need socks. OT providing socks and pt lacks ability to don socks in supine. Pt lacks awareness to deficits and need to wear oxygen. Pt reports "I walk around my house then put it back on" pt educated on oxygen drop to 86% RA just talking. Pt states "I know what I can do". Pt demanding to leave by noon today and that he will walk out of the room if his wheelchair does not come sooner. MD and RN made aware of pt verbalizations.  OT deferring all further OT to [facility] at this time.  Pt verbalized his goal was to add weight "I am too damn skinny look at his" but declines oob task. Pt verbalized feeling like a prisoner but when offered oob declines.      Recommendations for follow up therapy are one component of a multi-disciplinary discharge planning process, led by the attending physician.  Recommendations may be updated based on patient status, additional functional criteria and insurance authorization.   Follow Up Recommendations  Home health OT    Assistance Recommended at Discharge Set up Supervision/Assistance  Patient can return home with the following A little help with walking and/or transfers;A little help with bathing/dressing/bathroom;Assistance with feeding;Assistance with cooking/housework    Functional Status Assessment  Patient has had a recent decline in their functional status and demonstrates the ability to make significant improvements in function in a reasonable and predictable amount of time.  Equipment Recommendations  None recommended by OT    Recommendations for Other Services       Precautions  / Restrictions Precautions Precautions: Fall Precaution Comments: 2.5 [place] oxygen Restrictions Weight Bearing Restrictions: No      Mobility Bed Mobility Overal bed mobility: Needs Assistance Bed Mobility: Rolling Rolling: Supervision         General bed mobility comments: declines further , bed level LB    Transfers                   General transfer comment: refused      Balance Overall balance assessment: Mild deficits observed, not formally tested                                         ADL either performed or assessed with clinical judgement   ADL Overall ADL's : Needs assistance/impaired Eating/Feeding: Supervision/ safety Eating/Feeding Details (indicate cue type and reason): requesting juice with juice on tray alerted to fluid restrictions 1200 Ml pt states 'that just pisses me off"   Grooming Details (indicate cue type and reason): reports mouth is dry but declines to brush teeth             Lower Body Dressing: Moderate assistance;Bed level Lower Body Dressing Details (indicate cue type and reason): figure 4 cross but unable to open sock and place over toes. pt with decreased hand coordination. pt reports i can do it but lacks awareness to deficits               General ADL  Comments: declines further oob ...i am comfortable.     Vision Baseline Vision/History: 0 No visual deficits       Perception     Praxis      Pertinent Vitals/Pain Pain Assessment Pain Assessment: No/denies pain     Hand Dominance Right   Extremity/Trunk Assessment Upper Extremity Assessment Upper Extremity Assessment: Generalized weakness   Lower Extremity Assessment Lower Extremity Assessment: Defer to PT evaluation LLE Deficits / Details: reports pain with hip abductoin to don socks   Cervical / Trunk Assessment Cervical / Trunk Assessment: Kyphotic   Communication Communication Communication: No difficulties   Cognition  Arousal/Alertness: Awake/alert Behavior During Therapy: Agitated Overall Cognitive Status: Impaired/Different from baseline Area of Impairment: Awareness                           Awareness: Emergent   General Comments: pt reports he can complete LB dressing but when challenged to complete task requiring help. pt educated on o2 decrease 86% with [place] removed to wipe face. pt states i go around my house without it and then put it back on. pt advised and educated that his current needs for oxygen have changed. pt states "oh hell i can do it" pt not receptive to educatoin     General Comments  oxygen required based on bed level movement but patient lacks awarness. pt educated multiple times with various task with use of home monitor to educate patient on changes since baseline    Exercises     Shoulder Instructions      Home Living Family/patient expects to be discharged to:: Private residence Living Arrangements: Non-relatives/Friends Available Help at Discharge: Friend(s);Available 24 hours/day Type of Home: House Home Access: Stairs to enter [employer] of Steps: 2 Entrance Stairs-Rails: None Home Layout: One level     Bathroom Shower/Tub: Teacher, early years/pre: Standard Bathroom Accessibility: Yes   Home Equipment: Rollator (4 wheels);Cane - single point;Shower seat;Wheelchair - manual   Additional Comments: pt reports good friends live with him and provides as much assist as needed      Prior Functioning/Environment Prior Level of Function : Needs assist  Cognitive Assist : ADLs (cognitive)   ADLs (Cognitive): Intermittent cues Physical Assist : Mobility (physical);ADLs (physical)     Mobility Comments: limited ambulator with rollator, states his friend currently does not let him drive ADLs Comments: reports using shower chair and having assist from friend for all ADLs        OT Problem List: Decreased strength;Decreased activity  tolerance;Impaired balance (sitting and/or standing);Cardiopulmonary status limiting activity      OT Treatment/Interventions:      OT Goals(Current goals can be found in the care plan section) Acute Rehab OT Goals Patient Stated Goal: to leave today at noon OT Goal Formulation: With patient  OT Frequency:      Co-evaluation              AM-PAC OT "6 Clicks" Daily Activity     Outcome Measure Help from another person eating meals?: None Help from another person taking care of personal grooming?: None Help from another person toileting, which includes using toliet, bedpan, or urinal?: A Little Help from another person bathing (including washing, rinsing, drying)?: A Little Help from another person to put on and taking off regular upper body clothing?: A Little Help from another person to put on and taking off regular lower body clothing?: A Lot 6 Click  Score: 19   End of Session Equipment Utilized During Treatment: Oxygen Nurse Communication: Mobility status;Precautions  Activity Tolerance: Patient tolerated treatment well Patient left: in bed;with call bell/phone within reach;with bed alarm set  OT Visit Diagnosis: Unsteadiness on feet (R26.81)                Time: 5188-4166 OT Time Calculation (min): 18 min Charges:  OT General Charges $OT Visit: 1 Visit OT Evaluation $OT Eval Low Complexity: 1 Low   Brynn, OTR/L  Acute Rehabilitation Services Pager: [phone] Office: [phone] .   Jeri Modena 10/01/2021, 10:50 AM

## 2021-10-01 NOTE — Progress Notes (Signed)
Pt refused AM labs. On call MD notified.

## 2021-10-01 NOTE — Progress Notes (Signed)
Initial Nutrition Assessment  DOCUMENTATION CODES:  Underweight  INTERVENTION:  Continue current diet as ordered, encourage PO intake and compliance outpatient Low sodium/fluid restricted diet outpatient Ensure Enlive po BID, each supplement provides 350 kcal and 20 grams of protein  NUTRITION DIAGNOSIS:  Underweight related to  (inadequate intake in the context of increased needs) as evidenced by  (BMI of 18.1).  GOAL:  Patient will meet greater than or equal to 90% of their needs  MONITOR:  PO intake, Supplement acceptance, Labs, Weight trends  REASON FOR ASSESSMENT:  Consult Assessment of nutrition requirement/status  ASSESSMENT:  61 y.o. male with history of COPD on 2-2.5 L, CHF, ESRD on HD (MWF), HTN, HLD, GERD, PAF presented to ED (after recent admission) complaining of shortness of breath and chest pressure.  Imaging in ED showed pulmonary edema and right pleural effusion. Suggestive of volume overload.  Noted pt has been followed by nutrition team extensively in the past with most recent assessment 1/9 where education on diet recommendations was given. Pt stated at that time usual dry weight of 135 lb.   Pt being discharged at the time of visit, eager to leave. Unable to perform thorough assessment at this time.   Average Meal Intake: 1/29-1/31: 100% average intake x 3 recorded meals  Nutritionally Relevant Medications: Scheduled Meds:  feeding supplement  237 mL Oral BID BM   multivitamin  1 tablet Oral QHS   sevelamer carbonate  2,400 mg Oral TID WC   Labs Reviewed: BUN 42 / Creatinine 5.26 Phosphorus 5.8 Mg 2.7   NUTRITION - FOCUSED PHYSICAL EXAM: Unable to perform, pt being discharged  Diet Order:   Diet Order             Diet - low sodium heart healthy           Diet renal with fluid restriction Fluid restriction: 1200 mL Fluid; Room service appropriate? Yes; Fluid consistency: Thin  Diet effective now                   EDUCATION NEEDS:   Education needs have been addressed  Skin:  Skin Assessment: Skin Integrity Issues: Skin Integrity Issues:: Stage II Stage II: sacrum  Last BM:  1/30 - type 2  Height:  Ht Readings from Last 1 Encounters:  09/29/21 5\' 8"  (1.727 m)   Weight:  Wt Readings from Last 1 Encounters:  10/01/21 54 kg    Ideal Body Weight:  70 kg  BMI:  Body mass index is 18.1 kg/m.  Estimated Nutritional Needs:  Kcal:  1900-2100 kcal/d Protein:  95-105 Fluid:  1L+UPO   Ranell Patrick, RD, LDN Clinical Dietitian RD pager # available in [facility]  After hours/weekend pager # available in [facility]

## 2021-10-01 NOTE — Discharge Summary (Addendum)
Physician Discharge Summary  Thomas Robinson QIW:[id number] DOB: [birth year] DOA: 09/29/2021  PCP: [name], PA  Admit date: 09/29/2021 Discharge date: 10/01/2021  Admitted From: Home Disposition: Home  Recommendations for Outpatient Follow-up:  Follow up with PCP in 1 week  Comply with medications, follow-up Outpatient follow-up with dialysis unit as scheduled. Outpatient follow-up with palliative care.  Outpatient follow-up with cardiology Follow up in ED if symptoms worsen or new appear   Home Health: PT/OT Equipment/Devices: Continue supplemental oxygen via nasal cannula, currently at 2.5 L/min  Discharge Condition: Guarded to poor CODE STATUS: DNR Diet recommendation: Heart healthy/renal hemodialysis diet with fluid restriction of up to 1200 cc a day  Brief/Interim Summary: 61 year old male with history of chronic hypoxic respiratory failure on 2 to 2.5 L oxygen via nasal cannula, chronic diastolic heart failure, end-stage renal disease on hemodialysis, hypertension, paroxysmal A. fib on Eliquis and multiple recent hospitalizations for respiratory failure/pulmonary edema with most recent discharge on 09/19/2021 presented with worsening shortness of breath.  On presentation, he was saturating 75% on 2 L nasal cannula oxygen and was placed on 15 L nonrebreather.  BNP of 4476; high-sensitivity troponin 44.  Chest x-ray showed evidence of pulmonary edema, moderate right pleural effusion with no evidence of infiltrate or pneumothorax.  Nephrology was consulted.  He underwent hemodialysis on 09/29/2021 and again on 09/30/2021.  Subsequently, his respiratory status is much improved and he is adamant to go home today.  He is currently back to 2.5 L oxygen via nasal cannula which is his home oxygen requirement recently.  Nephrology has cleared him for discharge.  He will be discharged home today with outpatient follow-up with PCP/dialysis unit/palliative care.  CODE STATUS has been changed to  DNR by palliative care team.  Discharge Diagnoses:   Acute on chronic hypoxic respiratory failure Pulmonary edema Acute on chronic diastolic heart failure End-stage renal disease on hemodialysis -Normally wears 2 to 2.5 L oxygen via nasal cannula.  Presented with worsening hypoxia and was requiring 15 L nonrebreather on presentation with chest x-ray showing pulmonary edema and BNP of 4476 (has chronically elevated BNP).  He also briefly required BiPAP. -Nephrology following: Underwent hemodialysis on 09/29/2021 and on 09/30/2021-Respiratory status improving.   -Subsequently, his respiratory status is much improved and he is adamant to go home today.  He is currently back to 2.5 L oxygen via nasal cannula which is his home oxygen requirement recently.  Nephrology has cleared him for discharge.  He will be discharged home today with outpatient follow-up with PCP/dialysis unit -COVID-19/influenza testing were negative on presentation -He needs to be compliant with diet and fluid restriction and follow-up  -Discharge patient home today.  atypical chest pain -Possibly from above.  High sensitive troponin did not trend up.  EKG without any acute ischemic changes. -Currently chest pain-free.  Outpatient follow-up with cardiology.   Severe protein calorie malnutrition/cachexia -Follow nutrition recommendations.   Generalized deconditioning Multiple recurrent admissions History of noncompliance -Overall prognosis is very poor.  Palliative care has been consulted.  CODE STATUS has been changed to DNR by palliative care team this morning.  Outpatient follow-up with palliative care.   Paroxysmal A. Fib -Currently rate controlled.  Continue Cardizem and apixaban.   Hypertension -Blood pressure intermittently elevated.  Continue Cardizem.  Stage II right sacral pressure injury: Present on admission -Continue local wound care.   Discharge Instructions  Discharge Instructions     Amb Referral  to Palliative Care   Complete by: As directed  Diet - low sodium heart healthy   Complete by: As directed    Increase activity slowly   Complete by: As directed    No wound care   Complete by: As directed       Allergies as of 10/01/2021       Reactions   Vancomycin Shortness Of Breath, Itching        Medication List     STOP taking these medications    midodrine 10 MG tablet Commonly known as: PROAMATINE       TAKE these medications    acetaminophen 500 MG tablet Commonly known as: TYLENOL Take 500 mg by mouth every 6 (six) hours as needed (pain).   albuterol 108 (90 Base) MCG/ACT inhaler Commonly known as: VENTOLIN HFA Inhale 2 puffs into the lungs 2 (two) times daily as needed for shortness of breath or wheezing.   cinacalcet 60 MG tablet Commonly known as: SENSIPAR Take 1 tablet (60 mg total) by mouth every evening.   diltiazem 300 MG 24 hr capsule Commonly known as: CARDIZEM CD Take 1 capsule (300 mg total) by mouth daily. What changed: when to take this   Eliquis 2.5 MG Tabs tablet Generic drug: apixaban Take 1 tablet (2.5 mg total) by mouth 2 (two) times daily.   feeding supplement Liqd Take 237 mLs by mouth 2 (two) times daily between meals. What changed: when to take this   fluticasone furoate-vilanterol 100-25 MCG/ACT Aepb Commonly known as: BREO ELLIPTA Inhale 1 puff into the lungs daily as needed (For shortness of breath).   multivitamin Tabs tablet Take 1 tablet by mouth every morning.   nitroGLYCERIN 0.4 MG SL tablet Commonly known as: NITROSTAT Place 1 tablet (0.4 mg total) under the tongue every 5 (five) minutes as needed for chest pain.   sertraline 25 MG tablet Commonly known as: ZOLOFT Take 25 mg by mouth daily.   sevelamer carbonate 800 MG tablet Commonly known as: RENVELA Take 800 mg by mouth See admin instructions. Take one tablet (800 mg) by mouth three times daily - morning, evening and bedtime        Follow-up  Information     [facility] Follow up.   Specialty: Home Health Services Why: [facility] office to call with visit times. Contact information: [place] [address] [phone]         [name], [place]. Schedule an appointment as soon as possible for a visit in 1 week(s).   Specialty: Physician Assistant Contact information: [place] [address] [phone]         [name], DO .   Specialty: Cardiology Contact information: [address] [place] [address] [phone]                Allergies  Allergen Reactions   Vancomycin Shortness Of Breath and Itching    Consultations: Nephrology/palliative care   Procedures/Studies: DG Chest 1 View  Result Date: 09/11/2021 CLINICAL DATA:  Post thoracentesis. EXAM: PORTABLE CHEST 1 VIEW COMPARISON:  IR ultrasound, earlier same day. Chest XR, 09/10/2021 and 09/07/2018. CT chest, 08/26/2021. FINDINGS: Support lines: RIGHT IJ CVC with catheter tip at the proximal RIGHT atrium. Overlying pacer leads. Enlargement of the cardiac silhouette. Aortic arch calcifications. Interstitial thickening and coarse bilateral perihilar opacities. No focal consolidation. Trace residual RIGHT pleural effusion. No pneumothorax. Cervical fusion hardware. No interval osseous abnormality. IMPRESSION: 1. Interval removal of RIGHT  pleural effusion with trace residual. No postprocedure pneumothorax. 2. Cardiomegaly with pulmonary findings consistent with pulmonary edema. 3. Aortic Atherosclerosis (ICD10-I70.0). Electronically Signed   By: Michaelle Birks M.D.   On: 09/11/2021 13:32   DG Chest Portable 1 View  Result Date: 09/29/2021 CLINICAL DATA:  EGA. EXAM: PORTABLE CHEST 1 VIEW COMPARISON:  Ten days ago FINDINGS: Diffuse interstitial opacity with cephalized blood flow and moderate right pleural effusion. Dialysis  catheter on the right with tip at the lower SVC. Cardiomegaly. No pneumothorax. IMPRESSION: Pulmonary edema and right pleural effusion. Electronically Signed   By: Jorje Guild M.D.   On: 09/29/2021 04:53   DG Chest Port 1 View  Result Date: 09/19/2021 CLINICAL DATA:  Concern for sepsis EXAM: PORTABLE CHEST 1 VIEW COMPARISON:  Chest x-ray dated September 11, 2021 FINDINGS: Cardiac and mediastinal contours are unchanged. Stable position of right central venous line. Diffuse bilateral interstitial opacities. No focal consolidation. Small right-greater-than-left pleural effusions, right pleural effusion is increased in size. No evidence of pneumothorax. IMPRESSION: 1. Similar diffuse bilateral interstitial opacities, likely due to pulmonary edema. 2. Increased small right pleural effusion. Electronically Signed   By: Yetta Glassman M.D.   On: 09/19/2021 14:22   DG CHEST PORT 1 VIEW  Result Date: 09/10/2021 CLINICAL DATA:  Shortness of breath.  Hypoxemia. EXAM: PORTABLE CHEST 1 VIEW COMPARISON:  09/07/2021 FINDINGS: Central line remains in place with its tip in the SVC just above the right atrium. Persistent cardiomegaly. Worsening of pulmonary edema pattern. Bilateral effusions persist, right larger than left. Coexistent pneumonia not excluded. IMPRESSION: Some worsening of edema pattern. Persistent effusions, right larger than left. Electronically Signed   By: Nelson Chimes M.D.   On: 09/10/2021 10:29   DG Chest Port 1 View  Result Date: 09/07/2021 CLINICAL DATA:  Weakness and shortness of breath. EXAM: PORTABLE CHEST 1 VIEW COMPARISON:  Most recent radiograph 08/28/2021. Chest CTA 08/26/2021 FINDINGS: Right-sided dialysis catheter unchanged in position. Cardiomegaly is stable. Increasing right pleural effusion and basilar atelectasis. Increasing pulmonary edema. No pneumothorax. Stable osseous structures. IMPRESSION: 1. Increasing right pleural effusion and basilar atelectasis. 2. Increasing pulmonary  edema. Electronically Signed   By: Keith Rake M.D.   On: 09/07/2021 17:58   IR THORACENTESIS ASP PLEURAL SPACE W/IMG GUIDE  Result Date: 09/11/2021 INDICATION: Shortness of breath, bilateral right greater than left pleural effusions seen on previous chest x-ray. Request for therapeutic and diagnostic thoracentesis. EXAM: ULTRASOUND GUIDED RIGHT THORACENTESIS MEDICATIONS: 8 mL 1% lidocaine COMPLICATIONS: None immediate. PROCEDURE: An ultrasound guided thoracentesis was thoroughly discussed with the patient and questions answered. The benefits, risks, alternatives and complications were also discussed. The patient understands and wishes to proceed with the procedure. Written consent was obtained. Ultrasound was performed to localize and mark an adequate pocket of fluid in the right chest. The area was then prepped and draped in the normal sterile fashion. 1% Lidocaine was used for local anesthesia. Under ultrasound guidance a 6 Fr Safe-T-Centesis catheter was introduced. Thoracentesis was performed. The catheter was removed and a dressing applied. FINDINGS: A total of approximately 1.3 L of hazy light brown fluid was removed. Samples were sent to the laboratory as requested by the clinical team. Post procedure chest X-ray reviewed, negative for pneumothorax. IMPRESSION: Successful ultrasound guided right thoracentesis yielding 1.3 L of pleural fluid. Read by: Durenda Guthrie, PA-C Electronically Signed   By: Sandi Mariscal M.D.   On: 09/11/2021 15:26      Subjective: Patient seen and examined at bedside.  He is adamant that he wants to go home today.  Denies any fever, nausea or vomiting or chest pain. Discharge Exam: Vitals:   10/01/21 0841 10/01/21 0900  BP:    Pulse: 83 84  Resp:    Temp:    SpO2: 93% 93%    General: Pt is alert, awake, not in acute distress.  Currently on 2.5 L oxygen via nasal cannula.  Looks chronically ill and deconditioned and very thinly built. Cardiovascular: rate  controlled, S1/S2 + Respiratory: bilateral decreased breath sounds at bases with some scattered crackles Abdominal: Soft, NT, ND, bowel sounds + Extremities: Trace lower extremity edema; no cyanosis    The results of significant diagnostics from this hospitalization (including imaging, microbiology, ancillary and laboratory) are listed below for reference.     Microbiology: Recent Results (from the past 240 hour(s))  Resp Panel by RT-PCR (Flu A&B, Covid) Nasopharyngeal Swab     Status: None   Collection Time: 09/29/21  4:56 AM   Specimen: Nasopharyngeal Swab; Nasopharyngeal(NP) swabs in vial transport medium  Result Value Ref Range Status   SARS Coronavirus 2 by RT PCR NEGATIVE NEGATIVE Final    Comment: (NOTE) SARS-CoV-2 target nucleic acids are NOT DETECTED.  The SARS-CoV-2 RNA is generally detectable in upper respiratory specimens during the acute phase of infection. The lowest concentration of SARS-CoV-2 viral copies this assay can detect is 138 copies/mL. A negative result does not preclude SARS-Cov-2 infection and should not be used as the sole basis for treatment or other patient management decisions. A negative result may occur with  improper specimen collection/handling, submission of specimen other than nasopharyngeal swab, presence of viral mutation(s) within the areas targeted by this assay, and inadequate number of viral copies(<138 copies/mL). A negative result must be combined with clinical observations, patient history, and epidemiological information. The expected result is Negative.  Fact Sheet for Patients:  EntrepreneurPulse.com.au  Fact Sheet for Healthcare Providers:  IncredibleEmployment.be  This test is no t yet approved or cleared by the [place] FDA and  has been authorized for detection and/or diagnosis of SARS-CoV-2 by FDA under an Emergency Use Authorization (EUA). This EUA will remain  in effect (meaning  this test can be used) for the duration of the COVID-19 declaration under Section 564(b)(1) of the Act, 21 U.S.C.section 360bbb-3(b)(1), unless the authorization is terminated  or revoked sooner.       Influenza A by PCR NEGATIVE NEGATIVE Final   Influenza B by PCR NEGATIVE NEGATIVE Final    Comment: (NOTE) The Xpert Xpress SARS-CoV-2/FLU/RSV plus assay is intended as an aid in the diagnosis of influenza from Nasopharyngeal swab specimens and should not be used as a sole basis for treatment. Nasal washings and aspirates are unacceptable for Xpert Xpress SARS-CoV-2/FLU/RSV testing.  Fact Sheet for Patients: EntrepreneurPulse.com.au  Fact Sheet for Healthcare Providers: IncredibleEmployment.be  This test is not yet approved or cleared by the [place] FDA and has been authorized for detection and/or diagnosis of SARS-CoV-2 by FDA under an Emergency Use Authorization (EUA). This EUA will remain in effect (meaning this test can be used) for the duration of the COVID-19 declaration under Section 564(b)(1) of the Act, 21 U.S.C. section 360bbb-3(b)(1), unless the authorization is terminated or revoked.  Performed at [facility], [place] [address] [place], [place] [address]      Labs: BNP (last 3 results) Recent Labs    09/11/21 0608 09/19/21 1443 09/29/21 0344  BNP 4,395.2* >4,500.0* 4,315.4*   Basic Metabolic  Panel: Recent Labs  Lab 09/29/21 0344 09/29/21 0634 09/30/21 0323  NA 140  --  137  K 4.9  --  4.5  CL 100  --  97*  CO2 23  --  26  GLUCOSE 100*  --  98  BUN 63*  --  42*  CREATININE 7.55*  --  5.26*  CALCIUM 7.9*  --  8.0*  MG  --  2.7*  --   PHOS  --  5.8*  --    Liver Function Tests: Recent Labs  Lab 09/29/21 0344  AST 17  ALT 11  ALKPHOS 146*  BILITOT 0.9  PROT 7.3  ALBUMIN 3.2*   No results for input(s): LIPASE, AMYLASE in the last 168 hours. No results for input(s): AMMONIA in the last 168  hours. CBC: Recent Labs  Lab 09/29/21 0344  WBC 7.8  NEUTROABS 6.3  HGB 11.1*  HCT 35.6*  MCV 103.5*  PLT 188   Cardiac Enzymes: No results for input(s): CKTOTAL, CKMB, CKMBINDEX, TROPONINI in the last 168 hours. BNP: Invalid input(s): POCBNP CBG: No results for input(s): GLUCAP in the last 168 hours. D-Dimer No results for input(s): DDIMER in the last 72 hours. Hgb A1c No results for input(s): HGBA1C in the last 72 hours. Lipid Profile No results for input(s): CHOL, HDL, LDLCALC, TRIG, CHOLHDL, LDLDIRECT in the last 72 hours. Thyroid function studies No results for input(s): TSH, T4TOTAL, T3FREE, THYROIDAB in the last 72 hours.  Invalid input(s): FREET3 Anemia work up No results for input(s): VITAMINB12, FOLATE, FERRITIN, TIBC, IRON, RETICCTPCT in the last 72 hours. Urinalysis    Component Value Date/Time   COLORURINE YELLOW 05/24/2017 1729   APPEARANCEUR HAZY (A) 05/24/2017 1729   LABSPEC 1.011 05/24/2017 1729   PHURINE 5.0 05/24/2017 1729   GLUCOSEU 50 (A) 05/24/2017 1729   HGBUR SMALL (A) 05/24/2017 1729   BILIRUBINUR NEGATIVE 05/24/2017 1729   KETONESUR NEGATIVE 05/24/2017 1729   PROTEINUR 100 (A) 05/24/2017 1729   UROBILINOGEN 0.2 04/09/2015 1527   NITRITE NEGATIVE 05/24/2017 1729   LEUKOCYTESUR TRACE (A) 05/24/2017 1729   Sepsis Labs Invalid input(s): PROCALCITONIN,  WBC,  LACTICIDVEN Microbiology Recent Results (from the past 240 hour(s))  Resp Panel by RT-PCR (Flu A&B, Covid) Nasopharyngeal Swab     Status: None   Collection Time: 09/29/21  4:56 AM   Specimen: Nasopharyngeal Swab; Nasopharyngeal(NP) swabs in vial transport medium  Result Value Ref Range Status   SARS Coronavirus 2 by RT PCR NEGATIVE NEGATIVE Final    Comment: (NOTE) SARS-CoV-2 target nucleic acids are NOT DETECTED.  The SARS-CoV-2 RNA is generally detectable in upper respiratory specimens during the acute phase of infection. The lowest concentration of SARS-CoV-2 viral copies this  assay can detect is 138 copies/mL. A negative result does not preclude SARS-Cov-2 infection and should not be used as the sole basis for treatment or other patient management decisions. A negative result may occur with  improper specimen collection/handling, submission of specimen other than nasopharyngeal swab, presence of viral mutation(s) within the areas targeted by this assay, and inadequate number of viral copies(<138 copies/mL). A negative result must be combined with clinical observations, patient history, and epidemiological information. The expected result is Negative.  Fact Sheet for Patients:  EntrepreneurPulse.com.au  Fact Sheet for Healthcare Providers:  IncredibleEmployment.be  This test is no t yet approved or cleared by the [place] FDA and  has been authorized for detection and/or diagnosis of SARS-CoV-2 by FDA under an Emergency Use Authorization (EUA). This  EUA will remain  in effect (meaning this test can be used) for the duration of the COVID-19 declaration under Section 564(b)(1) of the Act, 21 U.S.C.section 360bbb-3(b)(1), unless the authorization is terminated  or revoked sooner.       Influenza A by PCR NEGATIVE NEGATIVE Final   Influenza B by PCR NEGATIVE NEGATIVE Final    Comment: (NOTE) The Xpert Xpress SARS-CoV-2/FLU/RSV plus assay is intended as an aid in the diagnosis of influenza from Nasopharyngeal swab specimens and should not be used as a sole basis for treatment. Nasal washings and aspirates are unacceptable for Xpert Xpress SARS-CoV-2/FLU/RSV testing.  Fact Sheet for Patients: EntrepreneurPulse.com.au  Fact Sheet for Healthcare Providers: IncredibleEmployment.be  This test is not yet approved or cleared by the [place] FDA and has been authorized for detection and/or diagnosis of SARS-CoV-2 by FDA under an Emergency Use Authorization (EUA). This EUA will  remain in effect (meaning this test can be used) for the duration of the COVID-19 declaration under Section 564(b)(1) of the Act, 21 U.S.C. section 360bbb-3(b)(1), unless the authorization is terminated or revoked.  Performed at [facility], [place] [address], [place], [place] [address]      Time coordinating discharge: 35 minutes  SIGNED:   Aline August, MD  [facility] 10/01/2021, 10:08 AM

## 2021-10-01 NOTE — Progress Notes (Signed)
Pt left for home with his aunt without supplemental O2.  RN educated pt to ask aunt to bring his portable oxygen cylinder. He was wheeled down with unit oxygen cylinder. His aunt did NOT bring portable oxygen cylinder and pt just pulled the [place] out and left in the car without any supplemental O2. Pt was educated  on the importance of wearing O2. Pt VSS.

## 2021-10-01 NOTE — Progress Notes (Signed)
° °  Palliative Medicine Inpatient Follow Up Note  HPI:  Per intake H&P --> : Thomas Robinson is a 61 y.o. male with medical history significant for chronic hypoxic respiratory failure on 2 to 2.5 L continuous nasal cannula, chronic diastolic heart failure, end-stage renal disease on hemodialysis on Monday, Wednesday, Friday schedule, hypertension, paroxysmal atrial fibrillation chronically anticoagulated on Eliquis, chronically elevated troponin, who is admitted to [facility] on 09/29/2021 with acute on chronic hypoxic respiratory failure due to acute on chronic diastolic heart failure after presenting from home to [facility] ED complaining of shortness of breath.    Palliative care has been consulted to further address goals of care in the setting of known noncompliance and 10 inpatient admissions within a 53-monthperiod of time.     Of note palliative care saw SAnnie Mainin November at which time a MOST was completed and he was identified as a DNR.  Today's Discussion (10/01/2021):  *Please note that this is a verbal dictation therefore any spelling or grammatical errors are due to the "DMadridOne" system interpretation.  Chart reviewed inclusive of vital signs, progress notes, laboratory results, and diagnostic images.   I met with SRemo Lippsat bedside, plan for discharge which he is awaiting. No Palliative questions/concerns.   No longer desires OP Palliative support.   Objective Assessment: Vital Signs Vitals:   10/01/21 0841 10/01/21 0900  BP:    Pulse: 83 84  Resp:    Temp:    SpO2: 93% 93%    Intake/Output Summary (Last 24 hours) at 10/01/2021 1048 Last data filed at 10/01/2021 0800 Gross per 24 hour  Intake 480 ml  Output 1690 ml  Net -1210 ml   Last Weight  Most recent update: 10/01/2021  5:10 AM    Weight  54 kg (119 lb 0.8 oz)            Gen: Frail middle-aged male in no acute distress HEENT: moist mucous membranes CV: Regular rate and irregular rhythm PULM:  On 2-1/2 L of oxygen breathing is even and nonlabored ABD: soft/nontender  EXT: Notable muscle wasting in all 4 extremities Neuro: Alert and oriented x3  SUMMARY OF RECOMMENDATIONS   DNAR/DNI   MOST and Advance Directives can be found in [employer]   Patient is now not wanting OP Palliative support   Given recurrent admissions was hopeful he would be a good candidate for the para medicine program though being that he is on hemodialysis he is not a candidate for the advanced heart failure clinic therefore would not qualify for para medicine   Plan for discharge today  MDM - Low   ______________________________________________________________________________________ [facility] Team Cell Phone: [phone] utilize secure chat with additional questions, if there is no response within 30 minutes please call the above phone number  Palliative Medicine Team providers are available by phone from 7am to 7pm daily and can be reached through the team cell phone.  Should this patient require assistance outside of these hours, please call the patient's attending physician.

## 2021-10-08 IMAGING — DX DG CHEST 1V PORT
1 series · 1 of 1 positions shown · non-contrast
Comparison: 01/13/2021

CLINICAL DATA: S/P hemodialysis catheter insertion

EXAM:
PORTABLE CHEST - 1 VIEW

[chest ap]
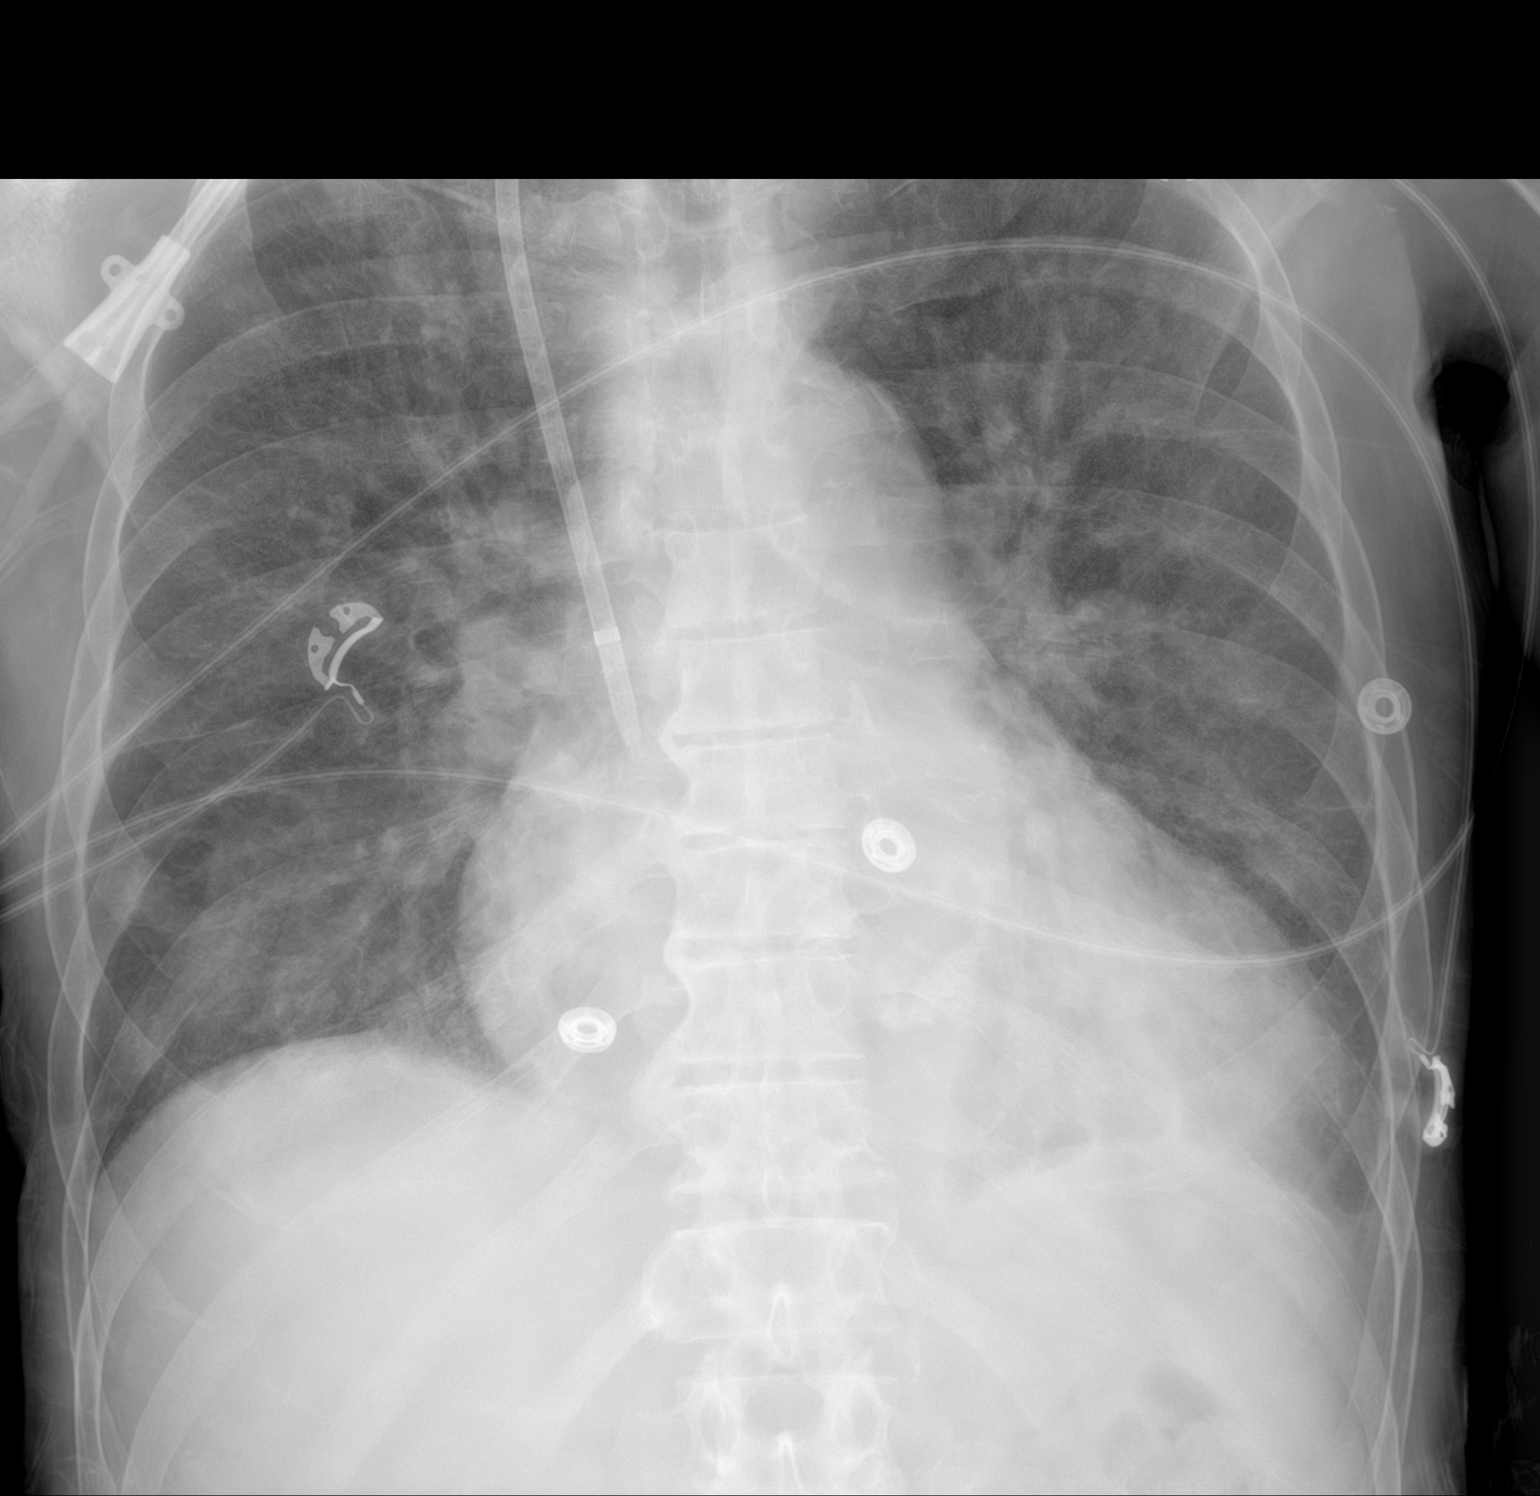

[1 of 1 positions shown; findings below may reference images not displayed]

FINDINGS: Tunneled right IJ hemodialysis catheter extends to the cavoatrial
junction. No pneumothorax.

Mild cardiomegaly stable.  Aortic Atherosclerosis ([id number]-170.0).

Hazy predominately perihilar interstitial opacities suggesting mild
edema. Patchy somewhat coarse poorly marginated airspace opacities
in the right apex and left retrocardiac region. Blunting of the left
lateral costophrenic angle suggesting small effusion.

Cervical fixation hardware partially visualized.
IMPRESSION: 1. Central venous catheter to cavoatrial junction without
pneumothorax.

## 2021-10-11 ENCOUNTER — Telehealth: Payer: Self-pay

## 2021-10-11 NOTE — Telephone Encounter (Signed)
Spoke with patient this morning and he requested a call back this afternoon. He reported he would be getting a new mobile number and did not have that number available. Attempted to contact patient back to schedule a Palliative Care consult appointment. No answer left a message to return call.

## 2021-10-14 ENCOUNTER — Encounter (HOSPITAL_COMMUNITY): Payer: Self-pay | Admitting: Family Medicine

## 2021-10-14 ENCOUNTER — Telehealth: Payer: Self-pay

## 2021-10-14 ENCOUNTER — Emergency Department (HOSPITAL_COMMUNITY): Payer: Medicare Other

## 2021-10-14 ENCOUNTER — Other Ambulatory Visit: Payer: Self-pay

## 2021-10-14 ENCOUNTER — Inpatient Hospital Stay (HOSPITAL_COMMUNITY)
Admission: EM | Admit: 2021-10-14 | Discharge: 2021-10-17 | DRG: 291 | Disposition: A | Discharge status: Home Health | Payer: Medicare Other | Attending: Internal Medicine | Admitting: Internal Medicine

## 2021-10-14 DIAGNOSIS — Z881 Allergy status to other antibiotic agents status: Secondary | ICD-10-CM

## 2021-10-14 DIAGNOSIS — Z87892 Personal history of anaphylaxis: Secondary | ICD-10-CM

## 2021-10-14 DIAGNOSIS — J918 Pleural effusion in other conditions classified elsewhere: Secondary | ICD-10-CM | POA: Diagnosis present

## 2021-10-14 DIAGNOSIS — Z981 Arthrodesis status: Secondary | ICD-10-CM

## 2021-10-14 DIAGNOSIS — I5032 Chronic diastolic (congestive) heart failure: Secondary | ICD-10-CM | POA: Diagnosis present

## 2021-10-14 DIAGNOSIS — Z79899 Other long term (current) drug therapy: Secondary | ICD-10-CM

## 2021-10-14 DIAGNOSIS — Z9115 Patient's noncompliance with renal dialysis: Secondary | ICD-10-CM

## 2021-10-14 DIAGNOSIS — G2581 Restless legs syndrome: Secondary | ICD-10-CM | POA: Diagnosis present

## 2021-10-14 DIAGNOSIS — N186 End stage renal disease: Secondary | ICD-10-CM | POA: Diagnosis present

## 2021-10-14 DIAGNOSIS — J90 Pleural effusion, not elsewhere classified: Secondary | ICD-10-CM

## 2021-10-14 DIAGNOSIS — Y92009 Unspecified place in unspecified non-institutional (private) residence as the place of occurrence of the external cause: Secondary | ICD-10-CM

## 2021-10-14 DIAGNOSIS — N2581 Secondary hyperparathyroidism of renal origin: Secondary | ICD-10-CM | POA: Diagnosis present

## 2021-10-14 DIAGNOSIS — K219 Gastro-esophageal reflux disease without esophagitis: Secondary | ICD-10-CM | POA: Diagnosis present

## 2021-10-14 DIAGNOSIS — I083 Combined rheumatic disorders of mitral, aortic and tricuspid valves: Secondary | ICD-10-CM | POA: Diagnosis present

## 2021-10-14 DIAGNOSIS — S2242XA Multiple fractures of ribs, left side, initial encounter for closed fracture: Secondary | ICD-10-CM | POA: Diagnosis present

## 2021-10-14 DIAGNOSIS — E440 Moderate protein-calorie malnutrition: Secondary | ICD-10-CM | POA: Diagnosis present

## 2021-10-14 DIAGNOSIS — J9611 Chronic respiratory failure with hypoxia: Secondary | ICD-10-CM | POA: Diagnosis present

## 2021-10-14 DIAGNOSIS — J449 Chronic obstructive pulmonary disease, unspecified: Secondary | ICD-10-CM | POA: Diagnosis present

## 2021-10-14 DIAGNOSIS — Z8673 Personal history of transient ischemic attack (TIA), and cerebral infarction without residual deficits: Secondary | ICD-10-CM

## 2021-10-14 DIAGNOSIS — S2232XA Fracture of one rib, left side, initial encounter for closed fracture: Secondary | ICD-10-CM | POA: Diagnosis present

## 2021-10-14 DIAGNOSIS — I7121 Aneurysm of the ascending aorta, without rupture: Secondary | ICD-10-CM | POA: Diagnosis present

## 2021-10-14 DIAGNOSIS — Z8674 Personal history of sudden cardiac arrest: Secondary | ICD-10-CM

## 2021-10-14 DIAGNOSIS — R0902 Hypoxemia: Secondary | ICD-10-CM

## 2021-10-14 DIAGNOSIS — D631 Anemia in chronic kidney disease: Secondary | ICD-10-CM | POA: Diagnosis present

## 2021-10-14 DIAGNOSIS — M1990 Unspecified osteoarthritis, unspecified site: Secondary | ICD-10-CM | POA: Diagnosis present

## 2021-10-14 DIAGNOSIS — Z681 Body mass index (BMI) 19 or less, adult: Secondary | ICD-10-CM

## 2021-10-14 DIAGNOSIS — I480 Paroxysmal atrial fibrillation: Secondary | ICD-10-CM | POA: Diagnosis present

## 2021-10-14 DIAGNOSIS — Z7901 Long term (current) use of anticoagulants: Secondary | ICD-10-CM

## 2021-10-14 DIAGNOSIS — R64 Cachexia: Secondary | ICD-10-CM | POA: Diagnosis present

## 2021-10-14 DIAGNOSIS — W010XXA Fall on same level from slipping, tripping and stumbling without subsequent striking against object, initial encounter: Secondary | ICD-10-CM | POA: Diagnosis present

## 2021-10-14 DIAGNOSIS — Z9981 Dependence on supplemental oxygen: Secondary | ICD-10-CM

## 2021-10-14 DIAGNOSIS — I132 Hypertensive heart and chronic kidney disease with heart failure and with stage 5 chronic kidney disease, or end stage renal disease: Principal | ICD-10-CM | POA: Diagnosis present

## 2021-10-14 DIAGNOSIS — E785 Hyperlipidemia, unspecified: Secondary | ICD-10-CM | POA: Diagnosis present

## 2021-10-14 DIAGNOSIS — I10 Essential (primary) hypertension: Secondary | ICD-10-CM | POA: Diagnosis present

## 2021-10-14 DIAGNOSIS — Z20822 Contact with and (suspected) exposure to covid-19: Secondary | ICD-10-CM | POA: Diagnosis present

## 2021-10-14 DIAGNOSIS — Z992 Dependence on renal dialysis: Secondary | ICD-10-CM

## 2021-10-14 LAB — CBC WITH DIFFERENTIAL/PLATELET
Abs Immature Granulocytes: 0.02 10*3/uL (ref 0.00–0.07)
Basophils Absolute: 0.1 10*3/uL (ref 0.0–0.1)
Basophils Relative: 1 %
Eosinophils Absolute: 0.2 10*3/uL (ref 0.0–0.5)
Eosinophils Relative: 5 %
HCT: 31 % — ABNORMAL LOW (ref 39.0–52.0)
Hemoglobin: 9.9 g/dL — ABNORMAL LOW (ref 13.0–17.0)
Immature Granulocytes: 0 %
Lymphocytes Relative: 12 %
Lymphs Abs: 0.5 10*3/uL — ABNORMAL LOW (ref 0.7–4.0)
MCH: 32.4 pg (ref 26.0–34.0)
MCHC: 31.9 g/dL (ref 30.0–36.0)
MCV: 101.3 fL — ABNORMAL HIGH (ref 80.0–100.0)
Monocytes Absolute: 0.4 10*3/uL (ref 0.1–1.0)
Monocytes Relative: 8 %
Neutro Abs: 3.3 10*3/uL (ref 1.7–7.7)
Neutrophils Relative %: 74 %
Platelets: 158 10*3/uL (ref 150–400)
RBC: 3.06 MIL/uL — ABNORMAL LOW (ref 4.22–5.81)
RDW: 15.4 % (ref 11.5–15.5)
WBC: 4.5 10*3/uL (ref 4.0–10.5)
nRBC: 0 % (ref 0.0–0.2)

## 2021-10-14 LAB — BASIC METABOLIC PANEL
Anion gap: 14 (ref 5–15)
BUN: 34 mg/dL — ABNORMAL HIGH (ref 8–23)
CO2: 27 mmol/L (ref 22–32)
Calcium: 8.1 mg/dL — ABNORMAL LOW (ref 8.9–10.3)
Chloride: 99 mmol/L (ref 98–111)
Creatinine, Ser: 5.02 mg/dL — ABNORMAL HIGH (ref 0.61–1.24)
GFR, Estimated: 12 mL/min — ABNORMAL LOW (ref >60)
Glucose, Bld: 78 mg/dL (ref 70–99)
Potassium: 4.8 mmol/L (ref 3.5–5.1)
Sodium: 140 mmol/L (ref 135–145)

## 2021-10-14 LAB — RESP PANEL BY RT-PCR (FLU A&B, COVID) ARPGX2
Influenza A by PCR: NEGATIVE
Influenza B by PCR: NEGATIVE
SARS Coronavirus 2 by RT PCR: NEGATIVE

## 2021-10-14 MED ORDER — ALBUTEROL SULFATE (2.5 MG/3ML) 0.083% IN NEBU: 2.5000 mg | INHALATION_SOLUTION | RESPIRATORY_TRACT | Status: DC | PRN

## 2021-10-14 MED ORDER — SODIUM CHLORIDE 0.9% FLUSH
3.0000 mL | Freq: Two times a day (BID) | INTRAVENOUS | Status: DC
  Administered 2021-10-14 – 2021-10-16 (×4): 3 mL via INTRAVENOUS

## 2021-10-14 MED ORDER — OXYCODONE HCL 5 MG PO TABS
5.0000 mg | ORAL_TABLET | Freq: Four times a day (QID) | ORAL | Status: DC | PRN
  Administered 2021-10-14 – 2021-10-17 (×4): 5 mg via ORAL
  Filled 2021-10-14 (×4): qty 1

## 2021-10-14 MED ORDER — ALBUTEROL SULFATE HFA 108 (90 BASE) MCG/ACT IN AERS: 2.0000 | INHALATION_SPRAY | RESPIRATORY_TRACT | Status: DC | PRN

## 2021-10-14 MED ORDER — ACETAMINOPHEN 325 MG PO TABS
650.0000 mg | ORAL_TABLET | Freq: Four times a day (QID) | ORAL | Status: DC | PRN
  Administered 2021-10-16 – 2021-10-17 (×3): 650 mg via ORAL
  Filled 2021-10-14 (×3): qty 2

## 2021-10-14 MED ORDER — ACETAMINOPHEN 650 MG RE SUPP
650.0000 mg | Freq: Four times a day (QID) | RECTAL | Status: DC | PRN
  Filled 2021-10-14: qty 1

## 2021-10-14 MED ORDER — FLUTICASONE FUROATE-VILANTEROL 100-25 MCG/ACT IN AEPB: 1.0000 | INHALATION_SPRAY | Freq: Every day | RESPIRATORY_TRACT | Status: DC | PRN

## 2021-10-14 MED ORDER — SODIUM CHLORIDE 0.9% FLUSH: 3.0000 mL | INTRAVENOUS | Status: DC | PRN

## 2021-10-14 MED ORDER — CINACALCET HCL 30 MG PO TABS
60.0000 mg | ORAL_TABLET | Freq: Every evening | ORAL | Status: DC
  Administered 2021-10-14 – 2021-10-15 (×2): 60 mg via ORAL
  Filled 2021-10-14 (×3): qty 2

## 2021-10-14 MED ORDER — HYDROMORPHONE HCL 1 MG/ML IJ SOLN
1.0000 mg | INTRAMUSCULAR | Status: AC | PRN
  Administered 2021-10-15 – 2021-10-16 (×4): 1 mg via INTRAVENOUS
  Filled 2021-10-14 (×4): qty 1

## 2021-10-14 MED ORDER — MIDODRINE HCL 5 MG PO TABS: 10.0000 mg | ORAL_TABLET | Freq: Three times a day (TID) | ORAL | Status: DC

## 2021-10-14 MED ORDER — FENTANYL CITRATE PF 50 MCG/ML IJ SOSY
100.0000 ug | PREFILLED_SYRINGE | Freq: Once | INTRAMUSCULAR | Status: AC
  Administered 2021-10-14: 100 ug via INTRAVENOUS
  Filled 2021-10-14: qty 2

## 2021-10-14 MED ORDER — KETAMINE HCL 50 MG/5ML IJ SOSY
10.0000 mg | PREFILLED_SYRINGE | Freq: Once | INTRAMUSCULAR | Status: AC
  Administered 2021-10-14: 10 mg via INTRAVENOUS
  Filled 2021-10-14: qty 5

## 2021-10-14 MED ORDER — ACETAMINOPHEN 500 MG PO TABS
1000.0000 mg | ORAL_TABLET | Freq: Once | ORAL | Status: AC
  Administered 2021-10-14: 1000 mg via ORAL
  Filled 2021-10-14: qty 2

## 2021-10-14 MED ORDER — NITROGLYCERIN 0.4 MG SL SUBL: 0.4000 mg | SUBLINGUAL_TABLET | SUBLINGUAL | Status: DC | PRN

## 2021-10-14 MED ORDER — APIXABAN 2.5 MG PO TABS
2.5000 mg | ORAL_TABLET | Freq: Two times a day (BID) | ORAL | Status: DC
  Administered 2021-10-14 – 2021-10-17 (×6): 2.5 mg via ORAL
  Filled 2021-10-14 (×7): qty 1

## 2021-10-14 MED ORDER — ENSURE ENLIVE PO LIQD
237.0000 mL | Freq: Every day | ORAL | Status: DC
  Administered 2021-10-15 – 2021-10-16 (×2): 237 mL via ORAL
  Filled 2021-10-14: qty 237

## 2021-10-14 MED ORDER — SEVELAMER CARBONATE 800 MG PO TABS
800.0000 mg | ORAL_TABLET | Freq: Three times a day (TID) | ORAL | Status: DC
  Administered 2021-10-15 – 2021-10-17 (×6): 800 mg via ORAL
  Filled 2021-10-14 (×6): qty 1

## 2021-10-14 MED ORDER — RENA-VITE PO TABS
1.0000 | ORAL_TABLET | Freq: Every day | ORAL | Status: DC
  Administered 2021-10-14 – 2021-10-16 (×3): 1 via ORAL
  Filled 2021-10-14 (×4): qty 1

## 2021-10-14 MED ORDER — DILTIAZEM HCL ER COATED BEADS 180 MG PO CP24
300.0000 mg | ORAL_CAPSULE | Freq: Every morning | ORAL | Status: DC
  Administered 2021-10-15 – 2021-10-17 (×3): 300 mg via ORAL
  Filled 2021-10-14 (×3): qty 1

## 2021-10-14 MED ORDER — SODIUM CHLORIDE 0.9 % IV SOLN: 250.0000 mL | INTRAVENOUS | Status: DC | PRN

## 2021-10-14 MED ORDER — SEVELAMER CARBONATE 800 MG PO TABS: 800.0000 mg | ORAL_TABLET | ORAL | Status: DC

## 2021-10-14 MED ORDER — MIDODRINE HCL 5 MG PO TABS
10.0000 mg | ORAL_TABLET | Freq: Three times a day (TID) | ORAL | Status: DC
  Administered 2021-10-14 – 2021-10-17 (×7): 10 mg via ORAL
  Filled 2021-10-14 (×7): qty 2

## 2021-10-14 NOTE — Assessment & Plan Note (Addendum)
Reviewed most recent discharge summary-prior thoracocentesis consistent with a transudate.  I suspect this is due to noncompliance with hemodialysis.  Chest x-ray was repeated after dialysis which showed stable findings.  Patient denies any worsening in his respiratory effort in the last few weeks.  Did not think that he needs thoracentesis as he is not experiencing any worsening symptoms.  He was told that he needed to be compliant with his dialysis and not curtail his treatment sessions.

## 2021-10-14 NOTE — Assessment & Plan Note (Addendum)
Secondary to fall.  Given pain medications.  Incentive spirometer.  Pain is better.  Will be discharged with a prescription for Oxy IR for a few more days.  Continue incentive spirometer use at home.  Follow-up with PCP next week

## 2021-10-14 NOTE — Assessment & Plan Note (Addendum)
-   Continue home medications 

## 2021-10-14 NOTE — Assessment & Plan Note (Addendum)
Seen by nephrology.  Dialyzed yesterday.  Can continue with his usual sessions starting tomorrow.  He is dialyzed Monday Wednesday Friday.

## 2021-10-14 NOTE — ED Triage Notes (Signed)
BIB [facility] - pt had 2.5hr of 3hr scheduled dialysis session, then arrived home and tripped, fell onto table on left side, c/o left rib pain on arrival. Pt wears 3LPM per [place] at home, on 5LPM per [place] on arrival. 150/90, 90HR, 36RR, reports pain on inspiration.

## 2021-10-14 NOTE — H&P (Signed)
History and Physical    Patient: Thomas Robinson HQP:[id number] DOB: 09-03-1960 DOA: 10/14/2021 DOS: the patient was seen and examined on 10/14/2021 PCP: [name], PA  Patient coming from: Home  Chief Complaint:  Chief Complaint  Patient presents with   Fall    HPI: Thomas Robinson is a 61 y.o. male with medical history significant of chronic hypoxic respiratory failure on 2 to 2.5 L continuous nasal cannula, chronic diastolic heart failure, end-stage renal disease on hemodialysis on Monday, Wednesday, Friday schedule, hypertension, paroxysmal atrial fibrillation chronically anticoagulated on Eliquis dents to the hospital after having a fall at home.  He had his normal hemodialysis session today and after he went home he went to his lunch and turn on his oxygen when he lost his balance and fell over landing on his left side on the corner of a table.  He complains of severe left-sided chest pain that is worse with deep inspiration twisting of his trunk or coughing.  Pain radiates into his upper back.  He reports if he takes shallow breaths it feels better.  He has been given pain medications in the emergency room but continues to have severe pain.  He denies any head injury or loss of consciousness.  He has not had any abdominal pain, nausea vomiting or diarrhea.  He has not had any fever.  He denies having an new cough.  He reports he has been losing weight for the last few months he is not sure why.  He states that he has been eating normally.   In the emergency room he has been hemodynamically stable.  He is required pain medication but continues to have significant discomfort.  He is found to have left lateral rib fractures as well as right pleural effusion.  ER physician discussed with trauma surgery who recommended patient be admitted to medicine service for pain control  Review of Systems: As mentioned in the history of present illness. All other systems reviewed and are  negative. Past Medical History:  Diagnosis Date   Anaphylactic shock, unspecified, sequela 06/10/2019   Aortic insufficiency    Aortic stenosis    Arthritis    Asthma, chronic, unspecified asthma severity, with acute exacerbation 10/23/2017   CAP (community acquired pneumonia) 10/23/2017   Carpal tunnel syndrome of right wrist 10/05/2018   Cataract    right - removed by surgery   Cerebral infarction due to thrombosis of cerebral artery (HCC)    Cervical disc herniation 01/04/2020   Chronic diastolic heart failure ([place]) 04/13/2018   Chronic low back pain 11/24/2019   Constipation    COPD (chronic obstructive pulmonary disease) (HCC)    Cough    chronic cough   Encounter for immunization 07/08/2017   ESRD on hemodialysis ([place]) 02/16/2018   Fall 06/04/2019   GERD (gastroesophageal reflux disease) 06/04/2019   GI bleeding 05/24/2017   Gram-negative sepsis, unspecified ([place]) 09/05/2017   History of fusion of cervical spine 03/26/2020   Hyperlipidemia    Hypertension    Hypokalemia 06/04/2017   Hypotension    Iron deficiency anemia, unspecified 09/09/2017   Left shoulder pain 11/11/2019   Lumbar radiculopathy 11/24/2019   Lung contusion 06/04/2019   LVH (left ventricular hypertrophy) due to hypertensive disease, with heart failure ([place]) 06/18/2020   Macrocytic anemia 05/24/2017   Mitral regurgitation    Moderate protein-calorie malnutrition ([place]) 06/06/2017   Myofascial pain syndrome 06/12/2020   Neuritis of right ulnar nerve 09/13/2018   Non-compliance with renal dialysis ([place])  02/08/2020   Oxygen deficiency 12/30/2019   O2 sats on RA 87% at PAT appt    Pain in joint of right elbow 09/13/2018   Paroxysmal atrial flutter (HCC)    PEA (Pulseless electrical activity) (HCC)    after sedation in 07/2021   Pulmonary hypertension ([place])    Renovascular hypertension 06/22/2020   Restless leg syndrome    Rib fractures 06/2019   Right   S/P cardiac cath 08/27/2020   normal  coronary arteries.   Secondary hyperparathyroidism of renal origin ([place]) 06/04/2017   Thoracic ascending aortic aneurysm    Tricuspid regurgitation    Ulnar neuropathy 10/05/2018   Volume overload 10/23/2017   Past Surgical History:  Procedure Laterality Date   A/V FISTULAGRAM N/A 01/01/2021   Procedure: A/V FISTULAGRAM;  Surgeon: Serafina Mitchell, MD;  Location: [place] [facility];  Service: Cardiovascular;  Laterality: N/A;   A/V FISTULAGRAM N/A 01/15/2021   Procedure: A/V [id number];  Surgeon: Serafina Mitchell, MD;  Location: [place] [facility];  Service: Cardiovascular;  Laterality: N/A;   ANTERIOR CERVICAL DECOMP/DISCECTOMY FUSION N/A 01/04/2020   Procedure: ANTERIOR CERVICAL DECOMPRESSION/DISCECTOMY FUSION CERVICAL FIVE THROUGH SEVEN;  Surgeon: Melina Schools, MD;  Location: [facility];  Service: Orthopedics;  Laterality: N/A;  3 hrs   AV FISTULA PLACEMENT Left 05/28/2017   Procedure: LEFT ARM ARTERIOVENOUS (AV) FISTULA CREATION;  Surgeon: Conrad [place], MD;  Location: [facility];  Service: Vascular;  Laterality: Left;   AV FISTULA PLACEMENT Left 02/02/2021   Procedure: Debride left forearm, ligation of left upper arm Aretiovenous fistula;  Surgeon: Elam Dutch, MD;  Location: [facility];  Service: Vascular;  Laterality: Left;   AV FISTULA PLACEMENT Right 04/04/2021   Procedure: RIGHT ARTERIOVENOUS (AV) FISTULA CREATION;  Surgeon: Serafina Mitchell, MD;  Location: [facility];  Service: Vascular;  Laterality: Right;   BIOPSY  07/10/2021   Procedure: BIOPSY;  Surgeon: Lavena Bullion, DO;  Location: [facility];  Service: Gastroenterology;;   COLONOSCOPY WITH PROPOFOL N/A 07/10/2021   Procedure: COLONOSCOPY WITH PROPOFOL;  Surgeon: Lavena Bullion, DO;  Location: [facility];  Service: Gastroenterology;  Laterality: N/A;   ESOPHAGOGASTRODUODENOSCOPY (EGD) WITH PROPOFOL N/A 07/10/2021   Procedure: ESOPHAGOGASTRODUODENOSCOPY (EGD) WITH PROPOFOL;  Surgeon: Lavena Bullion, DO;  Location: [facility];   Service: Gastroenterology;  Laterality: N/A;   EYE SURGERY Right 06/02/2019   Cataract removed   FRACTURE SURGERY     INSERTION OF DIALYSIS CATHETER Right 02/02/2021   Procedure: INSERTION OF Right Internal jugular TUNNELED  DIALYSIS CATHETER;  Surgeon: Elam Dutch, MD;  [facility];  Service: Vascular;  Laterality: Right;   IR DIALY SHUNT INTRO NEEDLE/INTRACATH INITIAL W/IMG LEFT Left 09/12/2020   IR FLUORO GUIDE CV LINE RIGHT  05/25/2017   IR FLUORO GUIDE CV LINE RIGHT  07/17/2021   IR THORACENTESIS ASP PLEURAL SPACE W/IMG GUIDE  09/11/2021   IR US GUIDE VASC ACCESS LEFT  09/12/2020   IR US GUIDE VASC ACCESS RIGHT  05/25/2017   IR US GUIDE VASC ACCESS RIGHT  07/17/2021   LIGATION OF ARTERIOVENOUS  FISTULA Left 01/10/2021   Procedure: LIGATION OF LEFT ARM RADIOCEPHALIC FISTULA;  Surgeon: Serafina Mitchell, MD;  [facility];  Service: Vascular;  Laterality: Left;   PERIPHERAL VASCULAR BALLOON ANGIOPLASTY Left 01/15/2021   Procedure: PERIPHERAL VASCULAR BALLOON ANGIOPLASTY;  Surgeon: Serafina Mitchell, MD;  Location: [place] [facility];  Service: Cardiovascular;  Laterality: Left;  AVF   REVISON OF ARTERIOVENOUS  FISTULA Left 07/18/2019   Procedure: REVISION PLICATION OF RADIOCEPHALIC ARTERIOVENOUS FISTULA LEFT ARM;  Surgeon: Angelia Mould, MD;  Location: [facility];  Service: Vascular;  Laterality: Left;   REVISON OF ARTERIOVENOUS FISTULA Left 01/10/2021   Procedure: CONVERSION TO BRACHIOCEPHALIC ARTERIOVENOUS FISTULA;  Surgeon: Serafina Mitchell, MD;  [facility];  Service: Vascular;  Laterality: Left;   RINOPLASTY     Social History:  reports that he has never smoked. He has never used smokeless tobacco. He reports that he does not currently use alcohol. He reports that he does not use drugs.  Allergies  Allergen Reactions   Vancomycin Shortness Of Breath and Itching    Family History  Problem Relation Age of Onset   Cancer Mother     Prior to Admission medications    Medication Sig Start Date End Date Taking? Authorizing Provider  acetaminophen (TYLENOL) 500 MG tablet Take 500 mg by mouth every 6 (six) hours as needed (pain).   Yes [provider]  albuterol (VENTOLIN HFA) 108 (90 Base) MCG/ACT inhaler Inhale 2 puffs into the lungs 2 (two) times daily as needed for shortness of breath or wheezing. 06/01/21  Yes Oswald Hillock, MD  apixaban (ELIQUIS) 2.5 MG TABS tablet Take 1 tablet (2.5 mg total) by mouth 2 (two) times daily. 09/13/21 10/14/21 Yes Elgergawy, Silver Huguenin, MD  Cholecalciferol (VITAMIN D3 PO) Take 1 capsule by mouth every Sunday.   Yes [provider]  cinacalcet (SENSIPAR) 60 MG tablet Take 1 tablet (60 mg total) by mouth every evening. 06/01/21  Yes Oswald Hillock, MD  Cyanocobalamin (VITAMIN B-12 PO) Take 1 tablet by mouth daily.   Yes [provider]  diltiazem (CARDIZEM CD) 300 MG 24 hr capsule Take 1 capsule (300 mg total) by mouth daily. Patient taking differently: Take 300 mg by mouth every morning. 09/21/21  Yes Ghimire, Henreitta Leber, MD  feeding supplement (ENSURE ENLIVE / ENSURE PLUS) LIQD Take 237 mLs by mouth 2 (two) times daily between meals. Patient taking differently: Take 237 mLs by mouth daily. 09/13/21  Yes Elgergawy, Silver Huguenin, MD  fluticasone furoate-vilanterol (BREO ELLIPTA) 100-25 MCG/ACT AEPB Inhale 1 puff into the lungs daily as needed (For shortness of breath).   Yes [provider]  midodrine (PROAMATINE) 10 MG tablet Take 10 mg by mouth 3 (three) times daily.   Yes [provider]  multivitamin (RENA-VIT) TABS tablet Take 1 tablet by mouth every morning. 01/12/18  Yes [provider]  nitroGLYCERIN (NITROSTAT) 0.4 MG SL tablet Place 1 tablet (0.4 mg total) under the tongue every 5 (five) minutes as needed for chest pain. 10/01/21  Yes Aline August, MD  sevelamer carbonate (RENVELA) 800 MG tablet Take 800 mg by mouth See admin instructions. Take one tablet (800 mg) by mouth three  times daily - morning, evening and bedtime 05/31/21  Yes [provider]  sertraline (ZOLOFT) 25 MG tablet Take 25 mg by mouth daily. Patient not taking: Reported on 09/30/2021 09/27/21   [provider]    Physical Exam: Vitals:   10/14/21 2100 10/14/21 2107 10/14/21 2115 10/14/21 2130  BP: 107/70  126/72 (!) 141/39  Pulse: 77 77 76 88  Resp: 19 19 16  (!) 23  Temp:      TempSrc:      SpO2: (!) 89% (!) 85% (!) 88% (!) 89%  Weight:      Height:       General: Thin cachetic appearing male, in discomfort. Alert  and oriented x3.  Eyes: EOMI, PERRL, conjunctivae normal.  Sclera nonicteric HENT:  [place]/AT, external ears normal.  Nares patent without epistasis.  Mucous membranes are moist. Neck: Soft, normal range of motion, supple, no masses, no thyromegaly.  Trachea midline Respiratory: Equal breath sounds but diminished on right more than left.  No retractions.  Diffuse scattered rales. no wheezing.  Left lateral rib mid cage tender to palpation no paradoxical motion Cardiovascular: Irregular rhythm, normal rate. no murmurs / rubs / gallops. No extremity edema.  Abdomen: Soft, no tenderness, nondistended, no rebound or guarding.  No masses palpated. Bowel sounds normoactive Musculoskeletal: FROM extremities. No cyanosis. No joint deformity upper and lower extremities. Normal muscle tone.  Skin: Warm, dry, intact no rashes, lesions, ulcers. No induration Neurologic: CN 2-12 grossly intact.  Normal speech.  Sensation intact Psychiatric: Normal judgment and insight.  Normal mood.    Data Reviewed: Lab work shows sodium 140 potassium 4.8 chloride 99 bicarb 27 glucose 78 BUN 34 creatinine 5.02 calcium 8.1 WBC 4500 hemoglobin 9.9 hematocrit 31 platelets 158,000 COVID-negative.  Influenza A and B negative Chest x-ray shows enlarged cardiac silhouette which is unchanged with moderate right-sided pleural effusion with mild pulmonary edema CT chest without contrast shows left lateral  sixth and seventh rib fractures with no pneumothorax.  Large right pleural effusion which is increased in size from prior study with compression atelectasis of the right lower lobe there is a small left pleural effusion with mild left basal atelectasis with mild pulmonary edema present.  Ascending thoracic aortic aneurysm of 4.3 cm EKG has been ordered and is pending  Assessment and Plan: * Left rib fracture- (present on admission) Thomas Robinson is placed on Medical telemetry for observation Pain control with IV dilaudid for severe pain overnight  Oxycodone for moderate pain ordered.  Flutter valve every 2 hours while awake  End stage renal disease ([place])- (present on admission) Consult Nephrology in am.  Pt had HD on 2/13 as scheduled  Hypoxemia Supplemental oxygen as needed to keep O2 sat 93-96%.. Pt is on oxygen at home at 2-3 L/min but is now requiring 5 L/min by n/c  Pleural effusion, right Has chronic pleural effusion which is increased in size today. May need IR for thorocentesis in am  Chronic diastolic (congestive) heart failure ([place])- (present on admission) Continue home medications. Monitor daily weight Had Echo Dec 08/2021 which showed LVOT obstruction with EF of 60-65% with LVH  Essential hypertension- (present on admission) Continue home medications. Monitor BP  AF (paroxysmal atrial fibrillation) ([place])- (present on admission) Continue eliquis, diltiazem  Protein-calorie malnutrition, moderate (HCC) Pt with weight loss over past few months. Consult nutritionist to evaluate.  Pt will need workup for etiology of weight loss    Advance Care Planning: CODE STATUS as full code    patient is on Eliquis for anticoagulation which would be continued  Family Communication: Diagnosis and plan discussed with patient.  Patient verbalized understanding agrees with plan.  Further recommendation as clinical indicated  Author: Eben Burow, MD 10/14/2021 9:48 PM  For on call  review [email or web address].

## 2021-10-14 NOTE — ED Provider Notes (Signed)
[facility] EMERGENCY DEPARTMENT Provider Note   CSN: [id number] Arrival date & time: 10/14/21  1613     History  Chief Complaint  Patient presents with   Isac Caddy Negan Grudzien is a 61 y.o. male.  61 yo M with a chief complaint of a fall.  The patient had just finished dialysis today and went home and was going to make himself some lunch and when he went to turn his oxygen on at home he lost his balance and fell onto his left side.  Complaining of left-sided chest wall pain that radiates into the back.  Has some pain with deep inspiration but when he breathes shallow feels okay.  Denies any head injury denies loss of consciousness denies abdominal pain.   Fall      Home Medications Prior to Admission medications   Medication Sig Start Date End Date Taking? Authorizing Provider  acetaminophen (TYLENOL) 500 MG tablet Take 500 mg by mouth every 6 (six) hours as needed (pain).    [provider]  albuterol (VENTOLIN HFA) 108 (90 Base) MCG/ACT inhaler Inhale 2 puffs into the lungs 2 (two) times daily as needed for shortness of breath or wheezing. 06/01/21   Oswald Hillock, MD  apixaban (ELIQUIS) 2.5 MG TABS tablet Take 1 tablet (2.5 mg total) by mouth 2 (two) times daily. 09/13/21 10/13/21  Elgergawy, Silver Huguenin, MD  cinacalcet (SENSIPAR) 60 MG tablet Take 1 tablet (60 mg total) by mouth every evening. 06/01/21   Oswald Hillock, MD  diltiazem (CARDIZEM CD) 300 MG 24 hr capsule Take 1 capsule (300 mg total) by mouth daily. Patient taking differently: Take 300 mg by mouth every morning. 09/21/21   Ghimire, Henreitta Leber, MD  feeding supplement (ENSURE ENLIVE / ENSURE PLUS) LIQD Take 237 mLs by mouth 2 (two) times daily between meals. Patient taking differently: Take 237 mLs by mouth daily. 09/13/21   Elgergawy, Silver Huguenin, MD  fluticasone furoate-vilanterol (BREO ELLIPTA) 100-25 MCG/ACT AEPB Inhale 1 puff into the lungs daily as needed (For shortness of breath).     [provider]  multivitamin (RENA-VIT) TABS tablet Take 1 tablet by mouth every morning. 01/12/18   [provider]  nitroGLYCERIN (NITROSTAT) 0.4 MG SL tablet Place 1 tablet (0.4 mg total) under the tongue every 5 (five) minutes as needed for chest pain. 10/01/21   Aline August, MD  sertraline (ZOLOFT) 25 MG tablet Take 25 mg by mouth daily. Patient not taking: Reported on 09/30/2021 09/27/21   [provider]  sevelamer carbonate (RENVELA) 800 MG tablet Take 800 mg by mouth See admin instructions. Take one tablet (800 mg) by mouth three times daily - morning, evening and bedtime 05/31/21   [provider]      Allergies    Vancomycin    Review of Systems   Review of Systems  Physical Exam Updated Vital Signs BP (!) 154/86 (BP Location: Left Arm)    Pulse 85    Temp 98.2 F (36.8 C) (Oral)    Resp (!) 23    Ht 5\' 8"  (1.727 m)    Wt 59 kg    SpO2 95%    BMI 19.77 kg/m  Physical Exam Vitals and nursing note reviewed.  Constitutional:      Appearance: He is well-developed.  HENT:     Head: Normocephalic and atraumatic.  Eyes:     Pupils: Pupils are equal, round, and reactive to light.  Neck:  Vascular: No JVD.  Cardiovascular:     Rate and Rhythm: Normal rate and regular rhythm.     Heart sounds: No murmur heard.   No friction rub. No gallop.  Pulmonary:     Effort: No respiratory distress.     Breath sounds: No wheezing.  Abdominal:     General: There is no distension.     Tenderness: There is no abdominal tenderness. There is no guarding or rebound.  Musculoskeletal:        General: Tenderness present. Normal range of motion.     Cervical back: Normal range of motion and neck supple.     Comments: Tenderness about the left-sided rib angles. No midline spinal tenderness.   Skin:    Coloration: Skin is not pale.     Findings: No rash.  Neurological:     Mental Status: He is alert and oriented to person, place, and time.  Psychiatric:         Behavior: Behavior normal.    ED Results / Procedures / Treatments   Labs (all labs ordered are listed, but only abnormal results are displayed) Labs Reviewed  CBC WITH DIFFERENTIAL/PLATELET  BASIC METABOLIC PANEL    EKG None  Radiology No results found.  Procedures Procedures    Medications Ordered in ED Medications  acetaminophen (TYLENOL) tablet 1,000 mg (has no administration in time range)  fentaNYL (SUBLIMAZE) injection 100 mcg (has no administration in time range)    ED Course/ Medical Decision Making/ A&P                           Medical Decision Making Amount and/or Complexity of Data Reviewed Labs: ordered. Radiology: ordered.  Risk OTC drugs. Prescription drug management. Decision regarding hospitalization.   61 yo M with a chief complaint of a fall.  Nonsyncopal by history.  Complaining of left-sided chest wall pain.  No obvious pneumothorax on initial exam.  Will obtain a chest x-ray to evaluate.  Attempt to treat pain.  Baseline blood work.  Reassess.  Refill of the chest independently interpreted by me without fracture.  Hemoglobin appears to be at baseline.  No significant electrolyte abnormality postdialysis.  Without obvious chest injury and patient with ongoing pain and continued increased oxygen demand we will obtain a CT scan.  CT scan with rib 6 and 7 fractures on the left.  Mildly enlarged right pleural effusion.  I discussed case with trauma surgery.  Recommended medical admission.  Discussed with medicine will admit.  CRITICAL CARE Performed by: Cecilio Asper   Total critical care time: 35 minutes  Critical care time was exclusive of separately billable procedures and treating other patients.  Critical care was necessary to treat or prevent imminent or life-threatening deterioration.  Critical care was time spent personally by me on the following activities: development of treatment plan with patient and/or surrogate as  well as nursing, discussions with consultants, evaluation of patient's response to treatment, examination of patient, obtaining history from patient or surrogate, ordering and performing treatments and interventions, ordering and review of laboratory studies, ordering and review of radiographic studies, pulse oximetry and re-evaluation of patient's condition.  The patients results and plan were reviewed and discussed.   Any x-rays performed were independently reviewed by myself.   Differential diagnosis were considered with the presenting HPI.  Medications  acetaminophen (TYLENOL) tablet 1,000 mg (1,000 mg Oral Given 10/14/21 1705)  fentaNYL (SUBLIMAZE) injection 100 mcg (100 mcg  Intravenous Given 10/14/21 1705)  fentaNYL (SUBLIMAZE) injection 100 mcg (100 mcg Intravenous Given 10/14/21 1825)  ketamine 50 mg in normal saline 5 mL (10 mg/mL) syringe (10 mg Intravenous Given 10/14/21 2131)    Vitals:   10/14/21 2100 10/14/21 2107 10/14/21 2115 10/14/21 2130  BP: 107/70  126/72 (!) 141/39  Pulse: 77 77 76 88  Resp: 19 19 16  (!) 23  Temp:      TempSrc:      SpO2: (!) 89% (!) 85% (!) 88% (!) 89%  Weight:      Height:        Final diagnoses:  Closed fracture of multiple ribs of left side, initial encounter    Admission/ observation were discussed with the admitting physician, patient and/or family and they are comfortable with the plan.         Final Clinical Impression(s) / ED Diagnoses Final diagnoses:  None    Rx / DC Orders ED Discharge Orders     None         Deno Etienne, DO 10/14/21 2137

## 2021-10-14 NOTE — Assessment & Plan Note (Addendum)
Chronic respiratory failure with hypoxia  Uses 2 to 3 L of oxygen at home.  Hypoxia is likely secondary to poor effort secondary to rib fracture.  Could also be from the pleural effusion.  Respiratory effort is stable.

## 2021-10-14 NOTE — Assessment & Plan Note (Addendum)
Volume management with dialysis.   Echocardiogram in December showed EF of 60 to 65% with LVH.

## 2021-10-14 NOTE — ED Notes (Signed)
Pt given crackers and cranberry juice. No acute changes noted. Pt continues to complain of pain in left chest with deep breaths. Will continue to monitor. Pt switched to [place] while eating.

## 2021-10-14 NOTE — Assessment & Plan Note (Addendum)
Stable.  Continue Eliquis and diltiazem.

## 2021-10-14 NOTE — ED Notes (Signed)
Pt assisted to help make a phone call. Pt given cranberry juice and sandwich bag per request. Pt complaining of pain in chest and has shallow respirations from pain. MD notified, waiting for further orders. Pt says he thinks his lungs are full. No acute changes noted. Will continue to monitor.

## 2021-10-14 NOTE — Assessment & Plan Note (Addendum)
Pt with weight loss over past few months.. Pt will need workup for etiology of weight loss which can be pursued in the outpatient setting.

## 2021-10-14 NOTE — Telephone Encounter (Signed)
Attempted to contact patient and patient's friend Thomas Robinson to schedule a Palliative Care consult appointment. No answer left a message for Thomas Robinson to return call. Patient's number is invalid now. When I spoke with him on 2/10 she said he was getting a new number and did not have that information available.

## 2021-10-15 DIAGNOSIS — Z8674 Personal history of sudden cardiac arrest: Secondary | ICD-10-CM | POA: Diagnosis not present

## 2021-10-15 DIAGNOSIS — J90 Pleural effusion, not elsewhere classified: Secondary | ICD-10-CM | POA: Insufficient documentation

## 2021-10-15 DIAGNOSIS — W010XXA Fall on same level from slipping, tripping and stumbling without subsequent striking against object, initial encounter: Secondary | ICD-10-CM | POA: Diagnosis present

## 2021-10-15 DIAGNOSIS — Z7901 Long term (current) use of anticoagulants: Secondary | ICD-10-CM | POA: Diagnosis not present

## 2021-10-15 DIAGNOSIS — D631 Anemia in chronic kidney disease: Secondary | ICD-10-CM | POA: Diagnosis present

## 2021-10-15 DIAGNOSIS — I132 Hypertensive heart and chronic kidney disease with heart failure and with stage 5 chronic kidney disease, or end stage renal disease: Secondary | ICD-10-CM | POA: Diagnosis present

## 2021-10-15 DIAGNOSIS — J449 Chronic obstructive pulmonary disease, unspecified: Secondary | ICD-10-CM | POA: Diagnosis present

## 2021-10-15 DIAGNOSIS — N186 End stage renal disease: Secondary | ICD-10-CM | POA: Diagnosis present

## 2021-10-15 DIAGNOSIS — I7121 Aneurysm of the ascending aorta, without rupture: Secondary | ICD-10-CM | POA: Diagnosis present

## 2021-10-15 DIAGNOSIS — Y92009 Unspecified place in unspecified non-institutional (private) residence as the place of occurrence of the external cause: Secondary | ICD-10-CM | POA: Diagnosis not present

## 2021-10-15 DIAGNOSIS — J9611 Chronic respiratory failure with hypoxia: Secondary | ICD-10-CM | POA: Diagnosis present

## 2021-10-15 DIAGNOSIS — I5032 Chronic diastolic (congestive) heart failure: Secondary | ICD-10-CM | POA: Diagnosis present

## 2021-10-15 DIAGNOSIS — S2242XA Multiple fractures of ribs, left side, initial encounter for closed fracture: Secondary | ICD-10-CM | POA: Diagnosis present

## 2021-10-15 DIAGNOSIS — I083 Combined rheumatic disorders of mitral, aortic and tricuspid valves: Secondary | ICD-10-CM | POA: Diagnosis present

## 2021-10-15 DIAGNOSIS — I480 Paroxysmal atrial fibrillation: Secondary | ICD-10-CM | POA: Diagnosis present

## 2021-10-15 DIAGNOSIS — G2581 Restless legs syndrome: Secondary | ICD-10-CM | POA: Diagnosis present

## 2021-10-15 DIAGNOSIS — Z681 Body mass index (BMI) 19 or less, adult: Secondary | ICD-10-CM | POA: Diagnosis not present

## 2021-10-15 DIAGNOSIS — R64 Cachexia: Secondary | ICD-10-CM | POA: Diagnosis present

## 2021-10-15 DIAGNOSIS — N2581 Secondary hyperparathyroidism of renal origin: Secondary | ICD-10-CM | POA: Diagnosis present

## 2021-10-15 DIAGNOSIS — E440 Moderate protein-calorie malnutrition: Secondary | ICD-10-CM | POA: Diagnosis present

## 2021-10-15 DIAGNOSIS — Z992 Dependence on renal dialysis: Secondary | ICD-10-CM | POA: Diagnosis not present

## 2021-10-15 DIAGNOSIS — E785 Hyperlipidemia, unspecified: Secondary | ICD-10-CM | POA: Diagnosis present

## 2021-10-15 DIAGNOSIS — Z20822 Contact with and (suspected) exposure to covid-19: Secondary | ICD-10-CM | POA: Diagnosis present

## 2021-10-15 DIAGNOSIS — Z9981 Dependence on supplemental oxygen: Secondary | ICD-10-CM | POA: Diagnosis not present

## 2021-10-15 DIAGNOSIS — J918 Pleural effusion in other conditions classified elsewhere: Secondary | ICD-10-CM | POA: Diagnosis present

## 2021-10-15 DIAGNOSIS — Z8673 Personal history of transient ischemic attack (TIA), and cerebral infarction without residual deficits: Secondary | ICD-10-CM | POA: Diagnosis not present

## 2021-10-15 LAB — BASIC METABOLIC PANEL
Anion gap: 15 (ref 5–15)
BUN: 40 mg/dL — ABNORMAL HIGH (ref 8–23)
CO2: 24 mmol/L (ref 22–32)
Calcium: 7.9 mg/dL — ABNORMAL LOW (ref 8.9–10.3)
Chloride: 100 mmol/L (ref 98–111)
Creatinine, Ser: 5.72 mg/dL — ABNORMAL HIGH (ref 0.61–1.24)
GFR, Estimated: 10 mL/min — ABNORMAL LOW (ref >60)
Glucose, Bld: 77 mg/dL (ref 70–99)
Potassium: 5.3 mmol/L — ABNORMAL HIGH (ref 3.5–5.1)
Sodium: 139 mmol/L (ref 135–145)

## 2021-10-15 LAB — CBC
HCT: 29.5 % — ABNORMAL LOW (ref 39.0–52.0)
Hemoglobin: 9.1 g/dL — ABNORMAL LOW (ref 13.0–17.0)
MCH: 31.9 pg (ref 26.0–34.0)
MCHC: 30.8 g/dL (ref 30.0–36.0)
MCV: 103.5 fL — ABNORMAL HIGH (ref 80.0–100.0)
Platelets: 133 10*3/uL — ABNORMAL LOW (ref 150–400)
RBC: 2.85 MIL/uL — ABNORMAL LOW (ref 4.22–5.81)
RDW: 15.4 % (ref 11.5–15.5)
WBC: 4.1 10*3/uL (ref 4.0–10.5)
nRBC: 0 % (ref 0.0–0.2)

## 2021-10-15 MED ORDER — CHLORHEXIDINE GLUCONATE CLOTH 2 % EX PADS
6.0000 | MEDICATED_PAD | Freq: Every day | CUTANEOUS | Status: DC
  Administered 2021-10-16: 6 via TOPICAL

## 2021-10-15 MED ORDER — SODIUM ZIRCONIUM CYCLOSILICATE 10 G PO PACK
10.0000 g | PACK | Freq: Once | ORAL | Status: AC
  Administered 2021-10-15: 10 g via ORAL
  Filled 2021-10-15: qty 1

## 2021-10-15 MED ORDER — LIDOCAINE 5 % EX PTCH
1.0000 | MEDICATED_PATCH | CUTANEOUS | Status: DC
  Administered 2021-10-15 – 2021-10-17 (×3): 1 via TRANSDERMAL
  Filled 2021-10-15 (×3): qty 1

## 2021-10-15 MED ORDER — DARBEPOETIN ALFA 150 MCG/0.3ML IJ SOSY
150.0000 ug | PREFILLED_SYRINGE | INTRAMUSCULAR | Status: DC
  Administered 2021-10-16: 150 ug via INTRAVENOUS
  Filled 2021-10-15 (×2): qty 0.3

## 2021-10-15 MED ORDER — PROCHLORPERAZINE EDISYLATE 10 MG/2ML IJ SOLN
10.0000 mg | Freq: Four times a day (QID) | INTRAMUSCULAR | Status: DC | PRN
  Administered 2021-10-15 – 2021-10-16 (×2): 10 mg via INTRAVENOUS
  Filled 2021-10-15 (×3): qty 2

## 2021-10-15 NOTE — ED Notes (Signed)
Place pt on non rebreather mask 15 L O2 sat 98%

## 2021-10-15 NOTE — ED Notes (Addendum)
RN replaced pt  back on 5L nasal canula to assess if pt oxygen dropped d/t eating. Pt O2 desat 83%. Placed pt back on nonrebreather 15L. Notified provider.

## 2021-10-15 NOTE — Progress Notes (Signed)
°  Progress Note   Patient: Thomas Robinson OAC:[id number] DOB: [birth year] DOA: 10/14/2021     0 DOS: the patient was seen and examined on 10/15/2021   Brief hospital course: Thomas Robinson is a 61 y.o. male with medical history significant of chronic hypoxic respiratory failure on 2 to 2.5 L continuous nasal cannula, chronic diastolic heart failure, end-stage renal disease on hemodialysis on Monday, Wednesday, Friday schedule, hypertension, paroxysmal atrial fibrillation chronically anticoagulated on Eliquis dents to the hospital after having a fall at home.  He had his normal hemodialysis session today and after he went home he went to his lunch and turn on his oxygen when he lost his balance and fell over landing on his left side on the corner of a table.  He complains of severe left-sided chest pain that is worse with deep inspiration twisting of his trunk or coughing  Assessment and Plan: * Left rib fracture- (present on admission) Pain control with IV dilaudid for severe pain overnight  Oxycodone for moderate pain ordered.  Flutter valve every 2 hours while awake Incentive spirometry  Pleural effusion, right Reviewed most recent discharge summary-prior thoracocentesis consistent with a transudate.  I suspect this is due to noncompliance with hemodialysis. -will plan for repeat x ray after HD and if still there/symptomatic can pursue IR thoracentesis    Protein-calorie malnutrition, moderate ([place]) Pt with weight loss over past few months. Consult nutritionist to evaluate.  Pt will need workup for etiology of weight loss  Hypoxemia Supplemental oxygen as needed to keep O2 sat 93-96%.. Pt is on oxygen at home at 2-3 L/min but is now requiring more  AF (paroxysmal atrial fibrillation) ([place])- (present on admission) Continue eliquis, diltiazem  End stage renal disease ([place])- (present on admission) HD 2/15 -needs HD and more volume pulled off as he has pleural effusions  Chronic  diastolic (congestive) heart failure ([place])- (present on admission) Continue home medications. Monitor daily weight Had Echo Dec 08/2021 which showed LVOT obstruction with EF of 60-65% with LVH  Essential hypertension- (present on admission) Continue home medications. Monitor BP      Subjective: feeling short of breath even with O2 sat of 98%  Physical Exam: Vitals:   10/15/21 0815 10/15/21 0830 10/15/21 1125 10/15/21 1130  BP:  (!) 146/35 (!) 142/38 (!) 145/43  Pulse: 86 99 73 71  Resp: 14 16 14 11   Temp:   98.8 F (37.1 C)   TempSrc:   Oral   SpO2: (!) 86% 98% 100% 100%  Weight:      Height:        General: Appearance:    Thin/frail male who appears uncomfortable     Lungs:     respirations labored  Heart:    Normal heart rate.   MS:   All extremities are intact.    Neurologic:   Awake, alert     Data Reviewed: K: 5.3 Hgb stable at 9    Disposition: Status is: Inpatient Remains inpatient appropriate because: needs O2 weaned and pain controlled, as well as repeat x ray after HD-- may need thoracentesis          Planned Discharge Destination: Home   Time spent: 45 minutes  Author: Geradine Girt, DO 10/15/2021 1:29 PM  For on call review [email or web address].

## 2021-10-15 NOTE — ED Notes (Signed)
Pt tolerating O2 via [place] at this time at 4L. Will continue to monitor. Pt continues to complain of pain with deep breaths and coughing. Pt instructed to splint area to help with pain. Pt also instructed to breath through his nose to ensure he is getting the O2 and to keep him from being on the venturi mask. No acute changes noted.

## 2021-10-15 NOTE — Consult Note (Signed)
ESRD Consult Note  Assessment/Recommendations:   # ESRD:  -outpatient orders: [place], MWF, 3.5hrs, F180, 400/800, 3k, 2.5Cal, UF profile 2, EDW 45.5kg. On mircera 218mcg q2weeks (last dose: 1/26) -plan for HD tomorrow, will keep on MWF schedule. K 5.3 today, will give a dose of lokelma  # Fall, rib fractures -per PMD  # Pleural effusion -per PMD, possible thora?  # Volume/ hypertension: EDW 45.5kg. Attempt to achieve EDW as tolerated  # Anemia of Chronic Kidney Disease: Hemoglobin 9.1. Resume ESAs here  # Secondary Hyperparathyroidism/Hyperphosphatemia: resume home binders, check phos   # Vascular access: TDC, per last [facility]VS note-planning on doing AVG as an outpatient (last seen in Nov 2022)  #Protein Calorie malnutrition -per pmd, push protein, nutritionist to be consulted  # Additional recommendations: - Dose all meds for creatinine clearance < 10 ml/min  - Unless absolutely necessary, no MRIs with gadolinium.  - Implement save arm precautions.  Prefer needle sticks in the dorsum of the hands or wrists.  No blood pressure measurements in arm. - If blood transfusion is requested during hemodialysis sessions, please alert [place] prior to the session.  - If a hemodialysis catheter line culture is requested, please alert [place] as only hemodialysis nurses are able to collect those specimens.   Recommendations were discussed with the primary team.   History of Present Illness: Thomas Robinson is a/an 61 y.o. male with a past medical history of ESRD on HD w/ noncompliance, chronic hypoxic respiratory failure on home o2, dCHF, HTN, pafib who presents with fall at home which resulted in left rib fx. Also found to have an increase in pleural effusion size on CXR. He does report SOB despite o2 sat's being in the 90's also reports pain at his ribs. Last HD 2/13. He reports that they have been using one needle in his AVF and the other in his [facility].  Medications:  Current Facility-Administered  Medications  Medication Dose Route Frequency Provider Last Rate Last Admin   0.9 %  sodium chloride infusion  250 mL Intravenous PRN Chotiner, Yevonne Aline, MD       acetaminophen (TYLENOL) tablet 650 mg  650 mg Oral Q6H PRN Chotiner, Yevonne Aline, MD       Or   acetaminophen (TYLENOL) suppository 650 mg  650 mg Rectal Q6H PRN Chotiner, Yevonne Aline, MD       albuterol (PROVENTIL) (2.5 MG/3ML) 0.083% nebulizer solution 2.5 mg  2.5 mg Inhalation Q4H PRN Chotiner, Yevonne Aline, MD       apixaban Arne [place]) tablet 2.5 mg  2.5 mg Oral BID Chotiner, Yevonne Aline, MD   2.5 mg at 10/15/21 0948   cinacalcet (SENSIPAR) tablet 60 mg  60 mg Oral QPM Chotiner, Yevonne Aline, MD   60 mg at 10/14/21 2332   diltiazem (CARDIZEM CD) 24 hr capsule 300 mg  300 mg Oral q morning Chotiner, Yevonne Aline, MD       feeding supplement (ENSURE ENLIVE / ENSURE PLUS) liquid 237 mL  237 mL Oral Daily Chotiner, Yevonne Aline, MD       fluticasone furoate-vilanterol (BREO ELLIPTA) 100-25 MCG/ACT 1 puff  1 puff Inhalation Daily PRN Chotiner, Yevonne Aline, MD       HYDROmorphone (DILAUDID) injection 1 mg  1 mg Intravenous Q3H PRN Chotiner, Yevonne Aline, MD   1 mg at 10/15/21 0812   lidocaine (LIDODERM) 5 % 1 patch  1 patch Transdermal Q24H Eulogio Bear U, DO   1 patch at 10/15/21 [phone]  midodrine (PROAMATINE) tablet 10 mg  10 mg Oral TID PC Chotiner, Yevonne Aline, MD   10 mg at 10/15/21 0815   multivitamin (RENA-VIT) tablet 1 tablet  1 tablet Oral QHS Chotiner, Yevonne Aline, MD   1 tablet at 10/14/21 2332   nitroGLYCERIN (NITROSTAT) SL tablet 0.4 mg  0.4 mg Sublingual Q5 min PRN Chotiner, Yevonne Aline, MD       oxyCODONE (Oxy IR/ROXICODONE) immediate release tablet 5 mg  5 mg Oral Q6H PRN Chotiner, Yevonne Aline, MD   5 mg at 10/14/21 2332   prochlorperazine (COMPAZINE) injection 10 mg  10 mg Intravenous Q6H PRN Chotiner, Yevonne Aline, MD       sevelamer carbonate (RENVELA) tablet 800 mg  800 mg Oral TID WC Chotiner, Yevonne Aline, MD   800 mg at 10/15/21 0814   sodium chloride  flush (NS) 0.9 % injection 3 mL  3 mL Intravenous Q12H Chotiner, Yevonne Aline, MD   3 mL at 10/15/21 1000   sodium chloride flush (NS) 0.9 % injection 3 mL  3 mL Intravenous PRN Chotiner, Yevonne Aline, MD       Current Outpatient Medications  Medication Sig Dispense Refill   acetaminophen (TYLENOL) 500 MG tablet Take 500 mg by mouth every 6 (six) hours as needed (pain).     albuterol (VENTOLIN HFA) 108 (90 Base) MCG/ACT inhaler Inhale 2 puffs into the lungs 2 (two) times daily as needed for shortness of breath or wheezing. 1 each 1   apixaban (ELIQUIS) 2.5 MG TABS tablet Take 1 tablet (2.5 mg total) by mouth 2 (two) times daily. 60 tablet 0   Cholecalciferol (VITAMIN D3 PO) Take 1 capsule by mouth every Sunday.     cinacalcet (SENSIPAR) 60 MG tablet Take 1 tablet (60 mg total) by mouth every evening. 30 tablet 1   Cyanocobalamin (VITAMIN B-12 PO) Take 1 tablet by mouth daily.     diltiazem (CARDIZEM CD) 300 MG 24 hr capsule Take 1 capsule (300 mg total) by mouth daily. (Patient taking differently: Take 300 mg by mouth every morning.) 30 capsule 2   feeding supplement (ENSURE ENLIVE / ENSURE PLUS) LIQD Take 237 mLs by mouth 2 (two) times daily between meals. (Patient taking differently: Take 237 mLs by mouth daily.) 237 mL 12   fluticasone furoate-vilanterol (BREO ELLIPTA) 100-25 MCG/ACT AEPB Inhale 1 puff into the lungs daily as needed (For shortness of breath).     midodrine (PROAMATINE) 10 MG tablet Take 10 mg by mouth 3 (three) times daily.     multivitamin (RENA-VIT) TABS tablet Take 1 tablet by mouth every morning.     nitroGLYCERIN (NITROSTAT) 0.4 MG SL tablet Place 1 tablet (0.4 mg total) under the tongue every 5 (five) minutes as needed for chest pain. 30 tablet 0   sevelamer carbonate (RENVELA) 800 MG tablet Take 800 mg by mouth See admin instructions. Take one tablet (800 mg) by mouth three times daily - morning, evening and bedtime     sertraline (ZOLOFT) 25 MG tablet Take 25 mg by mouth  daily. (Patient not taking: Reported on 09/30/2021)     Facility-Administered Medications Ordered in Other Encounters  Medication Dose Route Frequency Provider Last Rate Last Admin   EPINEPHrine (ADRENALIN) 1 MG/10ML injection   Intravenous Anesthesia Intra-op Helseth, Olivia C, CRNA   1 mg at 07/19/21 1018   fat emulsion 20 % bolus-local anes induced cardiac arrest - 54ml/kg over 1 min; MR q3 min for 3 doses total   Intravenous  Anesthesia Intra-op Rande Brunt, CRNA   250 mL at 07/19/21 1030   flumazenil (ROMAZICON) injection   Intravenous Anesthesia Intra-op Nolon Nations, MD   0.5 mg at 07/19/21 1013   lidocaine-EPINEPHrine (XYLOCAINE W/EPI) 2 %-1:100000 (with pres) injection   Peri-NEURAL Anesthesia Intra-op Nolon Nations, MD   20 mL at 07/19/21 0934   naloxone (NARCAN) injection   Intravenous Anesthesia Intra-op Nolon Nations, MD   40 mcg at 07/19/21 1005   phenylephrine (NEO-SYNEPHRINE) 20mg /NS 244mL premix infusion   Intravenous Continuous PRN Silas Flood C, CRNA   Stopped at 07/19/21 1150   propofol (DIPRIVAN) 500 MG/50ML infusion   Intravenous Continuous PRN Silas Flood C, CRNA   Stopped at 07/19/21 1129     ALLERGIES Vancomycin  MEDICAL HISTORY Past Medical History:  Diagnosis Date   Anaphylactic shock, unspecified, sequela 06/10/2019   Aortic insufficiency    Aortic stenosis    Arthritis    Asthma, chronic, unspecified asthma severity, with acute exacerbation 10/23/2017   CAP (community acquired pneumonia) 10/23/2017   Carpal tunnel syndrome of right wrist 10/05/2018   Cataract    right - removed by surgery   Cerebral infarction due to thrombosis of cerebral artery (HCC)    Cervical disc herniation 01/04/2020   Chronic diastolic heart failure ([place]) 04/13/2018   Chronic low back pain 11/24/2019   Constipation    COPD (chronic obstructive pulmonary disease) (HCC)    Cough    chronic cough   Encounter for immunization 07/08/2017   ESRD on hemodialysis ([place])  02/16/2018   Fall 06/04/2019   GERD (gastroesophageal reflux disease) 06/04/2019   GI bleeding 05/24/2017   Gram-negative sepsis, unspecified ([place]) 09/05/2017   History of fusion of cervical spine 03/26/2020   Hyperlipidemia    Hypertension    Hypokalemia 06/04/2017   Hypotension    Iron deficiency anemia, unspecified 09/09/2017   Left shoulder pain 11/11/2019   Lumbar radiculopathy 11/24/2019   Lung contusion 06/04/2019   LVH (left ventricular hypertrophy) due to hypertensive disease, with heart failure ([place]) 06/18/2020   Macrocytic anemia 05/24/2017   Mitral regurgitation    Moderate protein-calorie malnutrition ([place]) 06/06/2017   Myofascial pain syndrome 06/12/2020   Neuritis of right ulnar nerve 09/13/2018   Non-compliance with renal dialysis ([place]) 02/08/2020   Oxygen deficiency 12/30/2019   O2 sats on RA 87% at PAT appt    Pain in joint of right elbow 09/13/2018   Paroxysmal atrial flutter (HCC)    PEA (Pulseless electrical activity) ([place])    after sedation in 07/2021   Pulmonary hypertension ([place])    Renovascular hypertension 06/22/2020   Restless leg syndrome    Rib fractures 06/2019   Right   S/P cardiac cath 08/27/2020   normal coronary arteries.   Secondary hyperparathyroidism of renal origin ([place]) 06/04/2017   Thoracic ascending aortic aneurysm    Tricuspid regurgitation    Ulnar neuropathy 10/05/2018   Volume overload 10/23/2017     SOCIAL HISTORY Social History   Socioeconomic History   Marital status: Single    Spouse name: Not on file   Number of children: Not on file   Years of education: Not on file   Highest education level: Not on file  Occupational History   Not on file  Tobacco Use   Smoking status: Never   Smokeless tobacco: Never  Vaping Use   Vaping Use: Never used  Substance and Sexual Activity   Alcohol use: Not Currently    Alcohol/week: 0.0 standard drinks  Comment: has a drink once in a while- 12/30/19   Drug use: No   Sexual  activity: Not Currently  Other Topics Concern   Not on file  Social History Narrative   Lives with a roommate in a one story home.  Has 1 child.  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  Education: high school.   Social Determinants of Health   Financial Resource Strain: Not on file  Food Insecurity: Not on file  Transportation Needs: Not on file  Physical Activity: Not on file  Stress: Not on file  Social Connections: Not on file  Intimate Partner Violence: Not on file     FAMILY HISTORY Family History  Problem Relation Age of Onset   Cancer Mother      Review of Systems: 12 systems were reviewed and negative except per HPI  Physical Exam: Vitals:   10/15/21 1125 10/15/21 1130  BP: (!) 142/38 (!) 145/43  Pulse: 73 71  Resp: 14 11  Temp: 98.8 F (37.1 C)   SpO2: 100% 100%   No intake/output data recorded. No intake or output data in the 24 hours ending 10/15/21 1224 General: chronically ill appearing, frail/cachectic CV: normal rate, no murmurs, no edema Lungs: diminished air entry bibasilar Abd: soft, non-tender, non-distended Skin: no visible lesions or rashes Neuro: normal speech, no gross focal deficits  Dialysis access: RIJ TDC c/d/I, RUE AVF faint thrill  Test Results Reviewed Lab Results  Component Value Date   NA 139 10/15/2021   K 5.3 (H) 10/15/2021   CL 100 10/15/2021   CO2 24 10/15/2021   BUN 40 (H) 10/15/2021   CREATININE 5.72 (H) 10/15/2021   GFR 9.29 (LL) 04/07/2016   CALCIUM 7.9 (L) 10/15/2021   ALBUMIN 3.2 (L) 09/29/2021   PHOS 5.8 (H) 09/29/2021    I have reviewed relevant outside healthcare records

## 2021-10-15 NOTE — ED Notes (Signed)
RN contacted RT to assess pt for high flow nasal canula. RT at the bedside

## 2021-10-15 NOTE — ED Notes (Signed)
Pt declined house tray. He stated he just not that hungry. Pt stated he will drink his ensure instead

## 2021-10-15 NOTE — ED Notes (Signed)
Pt eating breakfast at the bedside °

## 2021-10-15 NOTE — Plan of Care (Signed)

## 2021-10-15 NOTE — TOC CAGE-AID Note (Signed)
Transition of Care [facility]) - CAGE-AID Screening   Patient Details  Name: Thomas Robinson MRN: [id number] Date of Birth: 08-23-1961  Transition of [facility] CM/SW Contact:    Gaetano Hawthorne Tarpley-Carter, [place] Phone Number: 10/15/2021, 10:21 AM   Clinical Narrative: Pt participated in [name].  Pt stated he does not use substance or ETOH.  Pt was not offered resources, due to no usage of substance or ETOH.    [name], MSW, LCSW-A Pronouns:  She/Her/Hers Scraper Transitions of Care Clinical Social Worker Direct Number:  [phone] [name]   CAGE-AID Screening:    Have You Ever Felt You Ought to Cut Down on Your Drinking or Drug Use?: No Have People Annoyed You By [employer] Your Drinking Or Drug Use?: No Have You Felt Bad Or Guilty About Your Drinking Or Drug Use?: No Have You Ever Had a Drink or Used Drugs First Thing In The Morning to Steady Your Nerves or to Get Rid of a Hangover?: No CAGE-AID Score: 0  Substance Abuse Education Offered: No

## 2021-10-15 NOTE — ED Notes (Signed)
Pt was eating breakfast and O2 sat 85%. Increased pt O2 6L. The pt O2 sat increased to 87%.

## 2021-10-15 NOTE — Plan of Care (Signed)

## 2021-10-15 NOTE — ED Notes (Signed)
RN fixed linens on bed. Pt stated he didn't want to wear gown, so RN removed. Pt stated he doesn't feel the oxygen. Informed him his O2 Sat are 100% and RR 12.

## 2021-10-15 NOTE — Hospital Course (Addendum)
Thomas Robinson is a 61 y.o. male with medical history significant of chronic hypoxic respiratory failure on 2 to 2.5 L continuous nasal cannula, chronic diastolic heart failure, end-stage renal disease on hemodialysis on Monday, Wednesday, Friday schedule, hypertension, paroxysmal atrial fibrillation chronically anticoagulated on Eliquis who presented to the hospital after having a fall at home.  He complained of severe left-sided chest pain.  Was found to have rib fractures.  Was hospitalized for pain control.  Also found to have pleural effusion.  Underwent dialysis.  Pain is better.  Okay for discharge today.

## 2021-10-15 NOTE — ED Notes (Signed)
Pt stated he would like to wait until his meal tray comes to take his medication.

## 2021-10-16 DIAGNOSIS — I5032 Chronic diastolic (congestive) heart failure: Secondary | ICD-10-CM

## 2021-10-16 LAB — GLUCOSE, CAPILLARY: Glucose-Capillary: 115 mg/dL — ABNORMAL HIGH (ref 70–99)

## 2021-10-16 MED ORDER — HEPARIN SODIUM (PORCINE) 1000 UNIT/ML IJ SOLN
INTRAMUSCULAR | Status: AC
  Filled 2021-10-16: qty 3

## 2021-10-16 MED ORDER — LIDOCAINE HCL (PF) 1 % IJ SOLN: 5.0000 mL | INTRAMUSCULAR | Status: DC | PRN

## 2021-10-16 MED ORDER — SODIUM CHLORIDE 0.9 % IV SOLN: 100.0000 mL | INTRAVENOUS | Status: DC | PRN

## 2021-10-16 MED ORDER — ALTEPLASE 2 MG IJ SOLR: 2.0000 mg | Freq: Once | INTRAMUSCULAR | Status: DC | PRN

## 2021-10-16 MED ORDER — PENTAFLUOROPROP-TETRAFLUOROETH EX AERO: 1.0000 "application " | INHALATION_SPRAY | CUTANEOUS | Status: DC | PRN

## 2021-10-16 MED ORDER — LIDOCAINE-PRILOCAINE 2.5-2.5 % EX CREA: 1.0000 "application " | TOPICAL_CREAM | CUTANEOUS | Status: DC | PRN

## 2021-10-16 MED ORDER — HEPARIN SODIUM (PORCINE) 1000 UNIT/ML DIALYSIS: 1000.0000 [IU] | INTRAMUSCULAR | Status: DC | PRN

## 2021-10-16 NOTE — Progress Notes (Signed)
[facility] PROGRESS NOTE   Kalman Nylen ZMO:[id number] DOB: 1960-10-14 DOA: 10/14/2021  1 DOS: the patient was seen and examined on 10/16/2021  PCP: [name], PA  Brief History and Hospital Course:  Thomas Robinson is a 61 y.o. male with medical history significant of chronic hypoxic respiratory failure on 2 to 2.5 L continuous nasal cannula, chronic diastolic heart failure, end-stage renal disease on hemodialysis on Monday, Wednesday, Friday schedule, hypertension, paroxysmal atrial fibrillation chronically anticoagulated on Eliquis who presented to the hospital after having a fall at home.  He complained of severe left-sided chest pain.  Was found to have rib fractures.  Was hospitalized for pain control.  Also found to have pleural effusion.  Consultants: Nephrology  Procedures: Hemodialysis    Subjective: Patient mentions that pain in the left side of his chest is reasonably well controlled.  Denies any nausea vomiting.  No shortness of breath at rest.    Assessment/Plan:   * Left rib fracture- (present on admission) Secondary to fall.  Pain seems to be well controlled this morning.  Continue incentive spirometry.  Mobilization.    Hypoxemia Chronic respiratory failure with hypoxia  Uses 2 to 3 L of oxygen at home.  Hypoxia is likely secondary to poor effort secondary to rib fracture.  Could also be from the pleural effusion.  Pleural effusion, right Reviewed most recent discharge summary-prior thoracocentesis consistent with a transudate.  I suspect this is due to noncompliance with hemodialysis. We will repeat chest x-ray tomorrow after dialysis today.  Might need thoracentesis prior to discharge.  End stage renal disease ([place])- (present on admission) Nephrology is following.  He is dialyzed on a Monday Wednesday Friday schedule   Essential hypertension- (present on admission) Blood pressure is reasonably well controlled.  Continue to monitor  for  AF (paroxysmal atrial fibrillation) ([place])- (present on admission) Stable.  Continue Eliquis and diltiazem.  Chronic diastolic (congestive) heart failure ([place])- (present on admission) Volume management with dialysis.   Echocardiogram in December showed EF of 60 to 65% with LVH.  Protein-calorie malnutrition, moderate (HCC) Pt with weight loss over past few months. Consult nutritionist to evaluate. Pt will need workup for etiology of weight loss which can be pursued in the outpatient setting.  Anemia of chronic disease Monitor hemoglobin   DVT Prophylaxis: On Eliquis Code Status: Full code Family Communication: Discussed with patient Disposition Plan: Hopefully return home in 1 to 2 days  Status is: Inpatient Remains inpatient appropriate because: Chest pain from rib fracture, pleural effusion     Medications: Scheduled:  apixaban  2.5 mg Oral BID   Chlorhexidine Gluconate Cloth  6 each Topical Q0600   cinacalcet  60 mg Oral QPM   darbepoetin (ARANESP) injection - DIALYSIS  150 mcg Intravenous Q Wed-HD   diltiazem  300 mg Oral q morning   feeding supplement  237 mL Oral Daily   lidocaine  1 patch Transdermal Q24H   midodrine  10 mg Oral TID PC   multivitamin  1 tablet Oral QHS   sevelamer carbonate  800 mg Oral TID WC   sodium chloride flush  3 mL Intravenous Q12H   Continuous:  sodium chloride     KPT:WSFKCL chloride, acetaminophen **OR** acetaminophen, albuterol, fluticasone furoate-vilanterol, nitroGLYCERIN, oxyCODONE, prochlorperazine, sodium chloride flush  Antibiotics: Anti-infectives (From admission, onward)    None       Objective:  Vital Signs  Vitals:   10/16/21 0700 10/16/21 0730 10/16/21 0800 10/16/21 0943  BP: [name])  134/41 (!) 130/39 (!) 147/43 (!) 147/79  Pulse: 73 71 77   Resp: 15 10 17    Temp:   98.4 F (36.9 C)   TempSrc:   Oral   SpO2: 100% 98% 91%   Weight:      Height:        Intake/Output Summary (Last 24 hours) at 10/16/2021  1038 Last data filed at 10/15/2021 2100 Gross per 24 hour  Intake --  Output 30 ml  Net -30 ml   Filed Weights   10/14/21 1630  Weight: 59 kg    General appearance: Awake alert.  In no distress Resp: Diminished air entry at the bases right more than left.  Dullness to percussion.  Few crackles. Cardio: S1-S2 is normal regular.  No S3-S4.  No rubs murmurs or bruit GI: Abdomen is soft.  Nontender nondistended.  Bowel sounds are present normal.  No masses organomegaly Extremities: No edema.  Full range of motion of lower extremities. Neurologic: Alert and oriented x3.  No focal neurological deficits.    Lab Results:  Data Reviewed: I have personally reviewed labs and imaging study reports  CBC: Recent Labs  Lab 10/14/21 1705 10/15/21 0342  WBC 4.5 4.1  NEUTROABS 3.3  --   HGB 9.9* 9.1*  HCT 31.0* 29.5*  MCV 101.3* 103.5*  PLT 158 133*    Basic Metabolic Panel: Recent Labs  Lab 10/14/21 1705 10/15/21 0342  NA 140 139  K 4.8 5.3*  CL 99 100  CO2 27 24  GLUCOSE 78 77  BUN 34* 40*  CREATININE 5.02* 5.72*  CALCIUM 8.1* 7.9*    GFR: Estimated Creatinine Clearance: 11.2 mL/min (A) (by C-G formula based on SCr of 5.72 mg/dL (H)).    Recent Results (from the past 240 hour(s))  Resp Panel by RT-PCR (Flu A&B, Covid) Nasopharyngeal Swab     Status: None   Collection Time: 10/14/21  8:33 PM   Specimen: Nasopharyngeal Swab; Nasopharyngeal(NP) swabs in vial transport medium  Result Value Ref Range Status   SARS Coronavirus 2 by RT PCR NEGATIVE NEGATIVE Final    Comment: (NOTE) SARS-CoV-2 target nucleic acids are NOT DETECTED.  The SARS-CoV-2 RNA is generally detectable in upper respiratory specimens during the acute phase of infection. The lowest concentration of SARS-CoV-2 viral copies this assay can detect is 138 copies/mL. A negative result does not preclude SARS-Cov-2 infection and should not be used as the sole basis for treatment or other patient management  decisions. A negative result may occur with  improper specimen collection/handling, submission of specimen other than nasopharyngeal swab, presence of viral mutation(s) within the areas targeted by this assay, and inadequate number of viral copies(<138 copies/mL). A negative result must be combined with clinical observations, patient history, and epidemiological information. The expected result is Negative.  Fact Sheet for Patients:  EntrepreneurPulse.com.au  Fact Sheet for Healthcare Providers:  IncredibleEmployment.be  This test is no t yet approved or cleared by the [place] FDA and  has been authorized for detection and/or diagnosis of SARS-CoV-2 by FDA under an Emergency Use Authorization (EUA). This EUA will remain  in effect (meaning this test can be used) for the duration of the COVID-19 declaration under Section 564(b)(1) of the Act, 21 U.S.C.section 360bbb-3(b)(1), unless the authorization is terminated  or revoked sooner.       Influenza A by PCR NEGATIVE NEGATIVE Final   Influenza B by PCR NEGATIVE NEGATIVE Final    Comment: (NOTE) The Xpert Xpress SARS-CoV-2/FLU/RSV plus  assay is intended as an aid in the diagnosis of influenza from Nasopharyngeal swab specimens and should not be used as a sole basis for treatment. Nasal washings and aspirates are unacceptable for Xpert Xpress SARS-CoV-2/FLU/RSV testing.  Fact Sheet for Patients: EntrepreneurPulse.com.au  Fact Sheet for Healthcare Providers: IncredibleEmployment.be  This test is not yet approved or cleared by the [place] FDA and has been authorized for detection and/or diagnosis of SARS-CoV-2 by FDA under an Emergency Use Authorization (EUA). This EUA will remain in effect (meaning this test can be used) for the duration of the COVID-19 declaration under Section 564(b)(1) of the Act, 21 U.S.C. section 360bbb-3(b)(1), unless the  authorization is terminated or revoked.  Performed at [facility], [place] [address] [place], [place] [address]       Radiology Studies: CT Chest Wo Contrast  Result Date: 10/14/2021 CLINICAL DATA:  Chest trauma, blunt.  Fall.  Left rib pain. EXAM: CT CHEST WITHOUT CONTRAST TECHNIQUE: Multidetector CT imaging of the chest was performed following the standard protocol without IV contrast. RADIATION DOSE REDUCTION: This exam was performed according to the departmental dose-optimization program which includes automated exposure control, adjustment of the mA and/or kV according to patient size and/or use of iterative reconstruction technique. COMPARISON:  08/26/2021 FINDINGS: Cardiovascular: Aneurysmal dilatation of the ascending thoracic aorta measuring up to 4.3 cm, stable since prior study. Cardiomegaly. Densely calcified mitral valve. Scattered aortic and coronary artery calcifications. Mediastinum/Nodes: Mildly enlarged mediastinal lymph nodes. Subcarinal lymph node has a short axis diameter of 1.4 cm. 11 mm right paratracheal lymph node. Similarly sized prevascular and AP window lymph nodes. No axillary adenopathy. Difficult to visualize/evaluate the hila without intravenous contrast. Lungs/Pleura: Large right pleural effusion, slightly increased since prior study. Compressive atelectasis in the right lower lobe. Bilateral ground-glass airspace opacities, favor edema. Small left pleural effusion with left base atelectasis. Upper Abdomen: Imaging into the upper abdomen demonstrates no acute findings. Musculoskeletal: Chest wall soft tissues are unremarkable. Nondisplaced fractures through the lateral left 6th and 7th ribs. No associated pneumothorax. IMPRESSION: Left lateral 6th and 7th rib fractures.  No pneumothorax. Large right pleural effusion, slightly increased since prior study. Compressive atelectasis in the right lower lobe. Small left pleural effusion with left base atelectasis.  Cardiomegaly. Diffuse ground-glass opacities throughout the lungs, favor edema. 4.3 cm ascending thoracic aortic aneurysm. Recommend annual imaging followup by CTA or MRA. This recommendation follows 2010 ACCF/AHA/AATS/ACR/ASA/SCA/SCAI/SIR/STS/SVM Guidelines for the Diagnosis and Management of Patients with Thoracic Aortic Disease. Circulation. 2010; 121: F818-E993. Aortic aneurysm NOS (ICD10-I71.9) Mildly prominent mediastinal lymph nodes, likely reactive. Coronary artery disease. Aortic Atherosclerosis (ICD10-I70.0). Electronically Signed   By: [name] M.D.   On: 10/14/2021 19:53   DG Chest Port 1 View  Result Date: 10/14/2021 CLINICAL DATA:  Chest pain. Patient had 2.5 hours and 3 hours scheduled dialysis session than tripped at home and fell onto table on left side. Left rib pain. EXAM: PORTABLE CHEST 1 VIEW COMPARISON:  AP chest 09/29/2021 FINDINGS: Right internal jugular dual-lumen central venous catheter tip again overlies the superior vena cava/right atrial junction. Cardiac silhouette is again moderately enlarged. Mediastinal contours are grossly within normal limits with calcification again seen within the aortic arch. Partial visualization of ACDF hardware. Moderate right pleural effusion with right mid and lower lung and left lower lung heterogeneous airspace opacities, similar to prior. No pneumothorax. Moderate multilevel degenerative bridging osteophytes of the mid to lower thoracic spine. IMPRESSION:: IMPRESSION: 1. Unchanged enlarged cardiac silhouette. 2. Moderate right  pleural effusion with right-greater-than-left basilar heterogeneous airspace opacities, similar to prior. Findings are suggestive of pulmonary edema, similar to prior. Electronically Signed   By: Yvonne Kendall M.D.   On: 10/14/2021 17:15       LOS: 1 day   [facility] Pager on [email or web address]  10/16/2021, 10:38 AM

## 2021-10-16 NOTE — Progress Notes (Signed)
Mobility Specialist Progress Note    10/16/21 1742  Mobility  Activity Refused mobility   Pt c/o soreness. Will f/u as schedule permits.   [facility] Mobility Specialist  M.S. 5N: [phone]

## 2021-10-16 NOTE — Plan of Care (Signed)

## 2021-10-16 NOTE — Progress Notes (Signed)
[facility] Progress Note    Assessment/ Plan:   # ESRD:  -outpatient orders: [place], MWF, 3.5hrs, F180, 400/800, 3k, 2.5Cal, UF profile 2, EDW 45.5kg. On mircera 211mcg q2weeks (last dose: 1/26) -HD planned for today, next HD 2/17   # Fall, rib fractures -per PMD   # Pleural effusion -per PMD, possible thora? Repeat cxr planned post-HD today   # Volume/ hypertension: EDW 45.5kg. Attempt to achieve EDW/UF as tolerated   # Anemia of Chronic Kidney Disease: Hemoglobin 9.1. Resumed ESAs here   # Secondary Hyperparathyroidism/Hyperphosphatemia: resume home binders, check phos    # Vascular access: TDC, per last VVS note-planning on doing AVG as an outpatient (last seen in Nov 2022)   #Protein Calorie malnutrition -per pmd, push protein   [name], MD [facility]  Subjective:   No acute events. No complaints today. He reports that his rib pain is controlled with the lidocaine patch.   Objective:   BP (!) 147/79    Pulse 77    Temp 98.4 F (36.9 C) (Oral)    Resp 17    Ht 5\' 8"  (1.727 m)    Wt 59 kg    SpO2 91%    BMI 19.77 kg/m   Intake/Output Summary (Last 24 hours) at 10/16/2021 1109 Last data filed at 10/15/2021 2100 Gross per 24 hour  Intake --  Output 30 ml  Net -30 ml   Weight change:   Physical Exam: VFI:EPPIRJJOACZ ill appearing, NAD, sitting up in bed CVS:rrr, s1s2 Resp:diminished air entry bibasilar, normal wob YSA:YTKZ, nt/nd Ext:no edema Neuro: awake, alert Dialysis access: [facility] c/d/i  Imaging: CT Chest Wo Contrast  Result Date: 10/14/2021 CLINICAL DATA:  Chest trauma, blunt.  Fall.  Left rib pain. EXAM: CT CHEST WITHOUT CONTRAST TECHNIQUE: Multidetector CT imaging of the chest was performed following the standard protocol without IV contrast. RADIATION DOSE REDUCTION: This exam was performed according to the departmental dose-optimization program which includes automated exposure control, adjustment of the mA and/or  kV according to patient size and/or use of iterative reconstruction technique. COMPARISON:  08/26/2021 FINDINGS: Cardiovascular: Aneurysmal dilatation of the ascending thoracic aorta measuring up to 4.3 cm, stable since prior study. Cardiomegaly. Densely calcified mitral valve. Scattered aortic and coronary artery calcifications. Mediastinum/Nodes: Mildly enlarged mediastinal lymph nodes. Subcarinal lymph node has a short axis diameter of 1.4 cm. 11 mm right paratracheal lymph node. Similarly sized prevascular and AP window lymph nodes. No axillary adenopathy. Difficult to visualize/evaluate the hila without intravenous contrast. Lungs/Pleura: Large right pleural effusion, slightly increased since prior study. Compressive atelectasis in the right lower lobe. Bilateral ground-glass airspace opacities, favor edema. Small left pleural effusion with left base atelectasis. Upper Abdomen: Imaging into the upper abdomen demonstrates no acute findings. Musculoskeletal: Chest wall soft tissues are unremarkable. Nondisplaced fractures through the lateral left 6th and 7th ribs. No associated pneumothorax. IMPRESSION: Left lateral 6th and 7th rib fractures.  No pneumothorax. Large right pleural effusion, slightly increased since prior study. Compressive atelectasis in the right lower lobe. Small left pleural effusion with left base atelectasis. Cardiomegaly. Diffuse ground-glass opacities throughout the lungs, favor edema. 4.3 cm ascending thoracic aortic aneurysm. Recommend annual imaging followup by CTA or MRA. This recommendation follows 2010 ACCF/AHA/AATS/ACR/ASA/SCA/SCAI/SIR/STS/SVM Guidelines for the Diagnosis and Management of Patients with Thoracic Aortic Disease. Circulation. 2010; 121: S010-X323. Aortic aneurysm NOS (ICD10-I71.9) Mildly prominent mediastinal lymph nodes, likely reactive. Coronary artery disease. Aortic Atherosclerosis (ICD10-I70.0). Electronically Signed   By: [name] M.D.  On: 10/14/2021 19:53    DG Chest Port 1 View  Result Date: 10/14/2021 CLINICAL DATA:  Chest pain. Patient had 2.5 hours and 3 hours scheduled dialysis session than tripped at home and fell onto table on left side. Left rib pain. EXAM: PORTABLE CHEST 1 VIEW COMPARISON:  AP chest 09/29/2021 FINDINGS: Right internal jugular dual-lumen central venous catheter tip again overlies the superior vena cava/right atrial junction. Cardiac silhouette is again moderately enlarged. Mediastinal contours are grossly within normal limits with calcification again seen within the aortic arch. Partial visualization of ACDF hardware. Moderate right pleural effusion with right mid and lower lung and left lower lung heterogeneous airspace opacities, similar to prior. No pneumothorax. Moderate multilevel degenerative bridging osteophytes of the mid to lower thoracic spine. IMPRESSION:: IMPRESSION: 1. Unchanged enlarged cardiac silhouette. 2. Moderate right pleural effusion with right-greater-than-left basilar heterogeneous airspace opacities, similar to prior. Findings are suggestive of pulmonary edema, similar to prior. Electronically Signed   By: Yvonne Kendall M.D.   On: 10/14/2021 17:15    Labs: BMET Recent Labs  Lab 10/14/21 1705 10/15/21 0342  NA 140 139  K 4.8 5.3*  CL 99 100  CO2 27 24  GLUCOSE 78 77  BUN 34* 40*  CREATININE 5.02* 5.72*  CALCIUM 8.1* 7.9*   CBC Recent Labs  Lab 10/14/21 1705 10/15/21 0342  WBC 4.5 4.1  NEUTROABS 3.3  --   HGB 9.9* 9.1*  HCT 31.0* 29.5*  MCV 101.3* 103.5*  PLT 158 133*    Medications:     apixaban  2.5 mg Oral BID   Chlorhexidine Gluconate Cloth  6 each Topical Q0600   cinacalcet  60 mg Oral QPM   darbepoetin (ARANESP) injection - DIALYSIS  150 mcg Intravenous Q Wed-HD   diltiazem  300 mg Oral q morning   feeding supplement  237 mL Oral Daily   lidocaine  1 patch Transdermal Q24H   midodrine  10 mg Oral TID PC   multivitamin  1 tablet Oral QHS   sevelamer carbonate  800 mg Oral  TID WC   sodium chloride flush  3 mL Intravenous Q12H      [name], MD [facility] 10/16/2021, 11:09 AM

## 2021-10-16 NOTE — Plan of Care (Signed)

## 2021-10-17 ENCOUNTER — Inpatient Hospital Stay (HOSPITAL_COMMUNITY): Payer: Medicare Other

## 2021-10-17 MED ORDER — OXYCODONE HCL 5 MG PO TABS: 5.0000 mg | ORAL_TABLET | Freq: Four times a day (QID) | ORAL | 0 refills | Status: DC | PRN

## 2021-10-17 NOTE — TOC Transition Note (Signed)
Transition of Care [facility]) - CM/SW Discharge Note   Patient Details  Name: Thomas Robinson MRN: [id number] Date of Birth: [birth year]  Transition of Care [facility]) CM/SW Contact:  Sharin Mons, RN Phone Number: 10/17/2021, 10:35 AM   Clinical Narrative:    Patient will DC to: Home Anticipated DC date: 10/18/2015 Family notified: yes Transport by: car  Redmitted with R pleural effusion and SOB .PMH includes HTN, chronic diastolic heart failure , ESRD on HD HTN. From home with friend . Active with [facility]. Agreeable to resuming home health services with [facility]. Per MD patient ready for DC today. RN, patient, patient's family/friend and [facility] notified of DC. [employer] to provide pt with portable oxygen tank to bedside for transport to home. Pt without Rx med concerns. Post hospital f/u noted on AVS. Friend to provide transportation to home.  RNCM will sign off for now as intervention is no longer needed. Please consult [place] again if new needs arise.    Final next level of care: [place] Barriers to Discharge: No Barriers Identified   Patient Goals and CMS Choice        Discharge Placement                       Discharge Plan and Services                                     Social Determinants of Health (SDOH) Interventions     Readmission Risk Interventions Readmission Risk Prevention Plan 09/08/2021 01/15/2021 09/11/2020  Transportation Screening Complete Complete Complete  Medication Review Press photographer) Complete Complete -  PCP or Specialist appointment within 3-5 days of discharge - - Not Complete  PCP/Specialist Appt Not Complete comments - - first available appt is 09/26/20  [place] or Home Care Consult Complete Complete (No Data)  SW Recovery Care/Counseling Consult Complete Complete Complete  Palliative Care Screening Not Applicable Not Applicable Not [place] Not Applicable Not  Applicable Not Applicable  Some recent data might be hidden

## 2021-10-17 NOTE — Progress Notes (Signed)
D/C order noted. Contacted [facility] to make clinic aware of pt's d/c today and pt should resume care tomorrow.   Melven Sartorius Renal Navigator [phone]

## 2021-10-17 NOTE — Progress Notes (Signed)
PT Cancellation Note  Patient Details Name: Thomas Robinson MRN: [id number] DOB: 1961-06-16   Cancelled Treatment:    Reason Eval/Treat Not Completed: Patient declined, no reason specified.  Reports he is just waiting for a tank to leave, anxious as his person who is transporting has to get to work.  Declined PT during the wait for a tank.  Will retry if pt changes his mind.   Ramond Dial 10/17/2021, 11:17 AM  Mee Hives, PT PhD Acute Rehab Dept. Number: [place] [facility] [place]

## 2021-10-17 NOTE — Progress Notes (Signed)
Peripheral IV came out, patient refuse new IV insertion, education provided and will continue to monitor.

## 2021-10-17 NOTE — Progress Notes (Signed)
Pt given AVS papers and educated on.  Pt to go home with o2 , baseline, 4 Liters of Oxygen. Family friend to pick up.  Central line in upper right chest for dialysis. DDI.

## 2021-10-17 NOTE — Progress Notes (Signed)
[facility]) Hospital Liaison note:  This is a pending outpatient-based Palliative Care patient. Will continue to follow for disposition.  Please call with any outpatient palliative questions or concerns.  Thank you, Lorelee Market, LPN [facility] Liaison [phone]

## 2021-10-17 NOTE — Discharge Summary (Signed)
[facility]  Physician Discharge Summary   Patient ID: Thomas Robinson MRN: [id number] DOB/AGE: 01/13/1961 61 y.o.  Admit date: 10/14/2021 Discharge date: 10/17/2021    PCP: [name], PA  DISCHARGE DIAGNOSES:  Principal Problem:   Left rib fracture Active Problems:   Hypoxemia   Pleural effusion, right   Essential hypertension   End stage renal disease (HCC)   Chronic diastolic (congestive) heart failure (HCC)   AF (paroxysmal atrial fibrillation) (HCC)   Protein-calorie malnutrition, moderate (HCC)   RECOMMENDATIONS FOR OUTPATIENT FOLLOW UP: To keep his dialysis schedule as before Will benefit from chest x-ray in the next 2 to 3 weeks to evaluate pleural effusion.   Home Health: None Equipment/Devices: Home oxygen  CODE STATUS: Full code  DISCHARGE CONDITION: fair  Diet recommendation: As before  INITIAL HISTORY: Thomas Robinson is a 61 y.o. male with medical history significant of chronic hypoxic respiratory failure on 2 to 2.5 L continuous nasal cannula, chronic diastolic heart failure, end-stage renal disease on hemodialysis on Monday, Wednesday, Friday schedule, hypertension, paroxysmal atrial fibrillation chronically anticoagulated on Eliquis who presented to the hospital after having a fall at home.  He complained of severe left-sided chest pain.  Was found to have rib fractures.  Was hospitalized for pain control.  Also found to have pleural effusion.  Underwent dialysis.  Pain is better.  Okay for discharge today.  Consultations: Nephrology  Procedures: Hemodialysis   HOSPITAL COURSE:    * Left rib fracture- (present on admission) Secondary to fall.  Given pain medications.  Incentive spirometer.  Pain is better.  Will be discharged with a prescription for Oxy IR for a few more days.  Continue incentive spirometer use at home.  Follow-up with PCP next week  Hypoxemia Chronic respiratory failure with hypoxia  Uses 2 to 3 L of oxygen at  home.  Hypoxia is likely secondary to poor effort secondary to rib fracture.  Could also be from the pleural effusion.  Respiratory effort is stable.  Did require up to 5 L of oxygen here in the hospital but seems to be tolerating 3 lpm with saturations in the early 90s.  Pleural effusion, right Reviewed most recent discharge summary-prior thoracocentesis consistent with a transudate.  I suspect this is due to noncompliance with hemodialysis.  Chest x-ray was repeated after dialysis which showed stable findings.  Patient denies any worsening in his respiratory effort in the last few weeks.  Did not think that he needs thoracentesis as he is not experiencing any worsening symptoms.  He was told that he needed to be compliant with his dialysis and not curtail his treatment sessions.  End stage renal disease ([place])- (present on admission) Seen by nephrology.  Dialyzed yesterday.  Can continue with his usual sessions starting tomorrow.  He is dialyzed Monday Wednesday Friday.  Essential hypertension- (present on admission) Continue home medications  AF (paroxysmal atrial fibrillation) ([place])- (present on admission) Stable.  Continue Eliquis and diltiazem.  Chronic diastolic (congestive) heart failure ([place])- (present on admission) Volume management with dialysis.   Echocardiogram in December showed EF of 60 to 65% with LVH.  Protein-calorie malnutrition, moderate (HCC) Pt with weight loss over past few months.. Pt will need workup for etiology of weight loss which can be pursued in the outpatient setting.   Patient is stable.  Okay for discharge home today.   PERTINENT LABS:  The results of significant diagnostics from this hospitalization (including imaging, microbiology, ancillary and laboratory) are listed below for  reference.    Microbiology: Recent Results (from the past 240 hour(s))  Resp Panel by RT-PCR (Flu A&B, Covid) Nasopharyngeal Swab     Status: None   Collection Time: 10/14/21   8:33 PM   Specimen: Nasopharyngeal Swab; Nasopharyngeal(NP) swabs in vial transport medium  Result Value Ref Range Status   SARS Coronavirus 2 by RT PCR NEGATIVE NEGATIVE Final    Comment: (NOTE) SARS-CoV-2 target nucleic acids are NOT DETECTED.  The SARS-CoV-2 RNA is generally detectable in upper respiratory specimens during the acute phase of infection. The lowest concentration of SARS-CoV-2 viral copies this assay can detect is 138 copies/mL. A negative result does not preclude SARS-Cov-2 infection and should not be used as the sole basis for treatment or other patient management decisions. A negative result may occur with  improper specimen collection/handling, submission of specimen other than nasopharyngeal swab, presence of viral mutation(s) within the areas targeted by this assay, and inadequate number of viral copies(<138 copies/mL). A negative result must be combined with clinical observations, patient history, and epidemiological information. The expected result is Negative.  Fact Sheet for Patients:  EntrepreneurPulse.com.au  Fact Sheet for Healthcare Providers:  IncredibleEmployment.be  This test is no t yet approved or cleared by the [place] FDA and  has been authorized for detection and/or diagnosis of SARS-CoV-2 by FDA under an Emergency Use Authorization (EUA). This EUA will remain  in effect (meaning this test can be used) for the duration of the COVID-19 declaration under Section 564(b)(1) of the Act, 21 U.S.C.section 360bbb-3(b)(1), unless the authorization is terminated  or revoked sooner.       Influenza A by PCR NEGATIVE NEGATIVE Final   Influenza B by PCR NEGATIVE NEGATIVE Final    Comment: (NOTE) The Xpert Xpress SARS-CoV-2/FLU/RSV plus assay is intended as an aid in the diagnosis of influenza from Nasopharyngeal swab specimens and should not be used as a sole basis for treatment. Nasal washings and aspirates  are unacceptable for Xpert Xpress SARS-CoV-2/FLU/RSV testing.  Fact Sheet for Patients: EntrepreneurPulse.com.au  Fact Sheet for Healthcare Providers: IncredibleEmployment.be  This test is not yet approved or cleared by the [place] FDA and has been authorized for detection and/or diagnosis of SARS-CoV-2 by FDA under an Emergency Use Authorization (EUA). This EUA will remain in effect (meaning this test can be used) for the duration of the COVID-19 declaration under Section 564(b)(1) of the Act, 21 U.S.C. section 360bbb-3(b)(1), unless the authorization is terminated or revoked.  Performed at [facility], [place] [address] [place], [place] [address]      Labs:  COVID-19 Labs   Lab Results  Component Value Date   SARSCOV2NAA NEGATIVE 10/14/2021   [place] NEGATIVE 09/29/2021   [place] NEGATIVE 09/19/2021   [place] NEGATIVE 08/25/2021      Basic Metabolic Panel: Recent Labs  Lab 10/14/21 1705 10/15/21 0342  NA 140 139  K 4.8 5.3*  CL 99 100  CO2 27 24  GLUCOSE 78 77  BUN 34* 40*  CREATININE 5.02* 5.72*  CALCIUM 8.1* 7.9*    CBC: Recent Labs  Lab 10/14/21 1705 10/15/21 0342  WBC 4.5 4.1  NEUTROABS 3.3  --   HGB 9.9* 9.1*  HCT 31.0* 29.5*  MCV 101.3* 103.5*  PLT 158 133*    CBG: Recent Labs  Lab 10/16/21 1308  GLUCAP 115*     IMAGING STUDIES CT Chest Wo Contrast  Result Date: 10/14/2021 CLINICAL DATA:  Chest trauma, blunt.  Fall.  Left rib pain.  EXAM: CT CHEST WITHOUT CONTRAST TECHNIQUE: Multidetector CT imaging of the chest was performed following the standard protocol without IV contrast. RADIATION DOSE REDUCTION: This exam was performed according to the departmental dose-optimization program which includes automated exposure control, adjustment of the mA and/or kV according to patient size and/or use of iterative reconstruction technique. COMPARISON:  08/26/2021 FINDINGS: Cardiovascular:  Aneurysmal dilatation of the ascending thoracic aorta measuring up to 4.3 cm, stable since prior study. Cardiomegaly. Densely calcified mitral valve. Scattered aortic and coronary artery calcifications. Mediastinum/Nodes: Mildly enlarged mediastinal lymph nodes. Subcarinal lymph node has a short axis diameter of 1.4 cm. 11 mm right paratracheal lymph node. Similarly sized prevascular and AP window lymph nodes. No axillary adenopathy. Difficult to visualize/evaluate the hila without intravenous contrast. Lungs/Pleura: Large right pleural effusion, slightly increased since prior study. Compressive atelectasis in the right lower lobe. Bilateral ground-glass airspace opacities, favor edema. Small left pleural effusion with left base atelectasis. Upper Abdomen: Imaging into the upper abdomen demonstrates no acute findings. Musculoskeletal: Chest wall soft tissues are unremarkable. Nondisplaced fractures through the lateral left 6th and 7th ribs. No associated pneumothorax. IMPRESSION: Left lateral 6th and 7th rib fractures.  No pneumothorax. Large right pleural effusion, slightly increased since prior study. Compressive atelectasis in the right lower lobe. Small left pleural effusion with left base atelectasis. Cardiomegaly. Diffuse ground-glass opacities throughout the lungs, favor edema. 4.3 cm ascending thoracic aortic aneurysm. Recommend annual imaging followup by CTA or MRA. This recommendation follows 2010 ACCF/AHA/AATS/ACR/ASA/SCA/SCAI/SIR/STS/SVM Guidelines for the Diagnosis and Management of Patients with Thoracic Aortic Disease. Circulation. 2010; 121: M841-L244. Aortic aneurysm NOS (ICD10-I71.9) Mildly prominent mediastinal lymph nodes, likely reactive. Coronary artery disease. Aortic Atherosclerosis (ICD10-I70.0). Electronically Signed   By: [name] M.D.   On: 10/14/2021 19:53   DG CHEST PORT 1 VIEW  Result Date: 10/17/2021 CLINICAL DATA:  Recurrent right pleural effusion. EXAM: PORTABLE CHEST 1 VIEW  COMPARISON:  October 14, 2021. FINDINGS: Stable cardiomegaly. Right internal jugular dialysis catheter is unchanged. Stable probable loculated right pleural effusion is noted. Bilateral lung opacities are noted concerning for edema. Bony thorax is unremarkable. IMPRESSION: Stable bilateral lung opacities concerning for edema. Stable probable loculated right pleural effusion. Electronically Signed   By: Marijo Conception M.D.   On: 10/17/2021 08:15   DG Chest Port 1 View  Result Date: 10/14/2021 CLINICAL DATA:  Chest pain. Patient had 2.5 hours and 3 hours scheduled dialysis session than tripped at home and fell onto table on left side. Left rib pain. EXAM: PORTABLE CHEST 1 VIEW COMPARISON:  AP chest 09/29/2021 FINDINGS: Right internal jugular dual-lumen central venous catheter tip again overlies the superior vena cava/right atrial junction. Cardiac silhouette is again moderately enlarged. Mediastinal contours are grossly within normal limits with calcification again seen within the aortic arch. Partial visualization of ACDF hardware. Moderate right pleural effusion with right mid and lower lung and left lower lung heterogeneous airspace opacities, similar to prior. No pneumothorax. Moderate multilevel degenerative bridging osteophytes of the mid to lower thoracic spine. IMPRESSION:: IMPRESSION: 1. Unchanged enlarged cardiac silhouette. 2. Moderate right pleural effusion with right-greater-than-left basilar heterogeneous airspace opacities, similar to prior. Findings are suggestive of pulmonary edema, similar to prior. Electronically Signed   By: Yvonne Kendall M.D.   On: 10/14/2021 17:15    DISCHARGE EXAMINATION: Vitals:   10/16/21 1530 10/16/21 1559 10/16/21 2216 10/17/21 0737  BP: 122/72 (!) 126/52 (!) 125/52 (!) 129/46  Pulse: 91 76 87 77  Resp: 17 17 16 17   Temp: [name])  97.4 F (36.3 C) 99.1 F (37.3 C) 98.6 F (37 C) 98.2 F (36.8 C)  TempSrc: Temporal Oral Oral Oral  SpO2:  96% 95% 94%  Weight:       Height:       General appearance: Awake alert.  In no distress Resp: Diminished air entry at the bases right more than left.  No wheezing or rhonchi. Cardio: S1-S2 is normal regular.  No S3-S4.  No rubs murmurs or bruit GI: Abdomen is soft.  Nontender nondistended.  Bowel sounds are present normal.  No masses organomegaly    DISPOSITION: Home  Discharge Instructions     Call MD for:  difficulty breathing, headache or visual disturbances   Complete by: As directed    Call MD for:  extreme fatigue   Complete by: As directed    Call MD for:  persistant dizziness or light-headedness   Complete by: As directed    Call MD for:  persistant nausea and vomiting   Complete by: As directed    Call MD for:  severe uncontrolled pain   Complete by: As directed    Call MD for:  temperature >100.4   Complete by: As directed    Diet - low sodium heart healthy   Complete by: As directed    Discharge instructions   Complete by: As directed    Please go for your dialysis as per usual schedule.  Take your medications as prescribed.  Follow-up with your primary care provider in 1 week.  Please stay for full sessions of your dialysis which will help reduce the amount of fluid that you have in your lungs.  Continue to use the incentive spirometer at home.  Stay active as much as possible.  You were cared for by a hospitalist during your hospital stay. If you have any questions about your discharge medications or the care you received while you were in the hospital after you are discharged, you can call the unit and asked to speak with the hospitalist on call if the hospitalist that took care of you is not available. Once you are discharged, your primary care physician will handle any further medical issues. Please note that NO REFILLS for any discharge medications will be authorized once you are discharged, as it is imperative that you return to your primary care physician (or establish a relationship  with a primary care physician if you do not have one) for your aftercare needs so that they can reassess your need for medications and monitor your lab values. If you do not have a primary care physician, you can call [phone] for a physician referral.   Increase activity slowly   Complete by: As directed    No wound care   Complete by: As directed          Allergies as of 10/17/2021       Reactions   Vancomycin Shortness Of Breath, Itching        Medication List     TAKE these medications    acetaminophen 500 MG tablet Commonly known as: TYLENOL Take 500 mg by mouth every 6 (six) hours as needed (pain).   albuterol 108 (90 Base) MCG/ACT inhaler Commonly known as: VENTOLIN HFA Inhale 2 puffs into the lungs 2 (two) times daily as needed for shortness of breath or wheezing.   cinacalcet 60 MG tablet Commonly known as: SENSIPAR Take 1 tablet (60 mg total) by mouth every evening.   diltiazem 300 MG 24 hr  capsule Commonly known as: CARDIZEM CD Take 1 capsule (300 mg total) by mouth daily. What changed: when to take this   Eliquis 2.5 MG Tabs tablet Generic drug: apixaban Take 1 tablet (2.5 mg total) by mouth 2 (two) times daily.   feeding supplement Liqd Take 237 mLs by mouth 2 (two) times daily between meals. What changed: when to take this   fluticasone furoate-vilanterol 100-25 MCG/ACT Aepb Commonly known as: BREO ELLIPTA Inhale 1 puff into the lungs daily as needed (For shortness of breath).   midodrine 10 MG tablet Commonly known as: PROAMATINE Take 10 mg by mouth 3 (three) times daily.   multivitamin Tabs tablet Take 1 tablet by mouth every morning.   nitroGLYCERIN 0.4 MG SL tablet Commonly known as: NITROSTAT Place 1 tablet (0.4 mg total) under the tongue every 5 (five) minutes as needed for chest pain.   oxyCODONE 5 MG immediate release tablet Commonly known as: Oxy IR/ROXICODONE Take 1 tablet (5 mg total) by mouth every 6 (six) hours as needed for  severe pain.   sertraline 25 MG tablet Commonly known as: ZOLOFT Take 25 mg by mouth daily.   sevelamer carbonate 800 MG tablet Commonly known as: RENVELA Take 800 mg by mouth See admin instructions. Take one tablet (800 mg) by mouth three times daily - morning, evening and bedtime   VITAMIN B-12 PO Take 1 tablet by mouth daily.   VITAMIN D3 PO Take 1 capsule by mouth every Sunday.          Follow-up Information     [place], [place], [place] Follow up in 4 day(s).   Specialty: Physician Assistant Why: To follow-up on pleural effusion and for further work-up of weight loss Contact information: [place] [phone]         [facility] Follow up.   Why: Resumption of home health services will begin within 48hours post discharge.  Call with questions or concerns ,[phone]                TOTAL DISCHARGE TIME: 65 minutes  Mischa Brittingham [employer] on [email or web address]  10/18/2021, 12:25 PM

## 2021-10-17 NOTE — Progress Notes (Signed)
Mobility Specialist Progress Note    10/17/21 0958  Mobility  Activity Refused mobility   Pt said he will get up when he is walking out of here.   [facility] Mobility Specialist  M.S. 5N: [id number]

## 2021-10-17 NOTE — Progress Notes (Signed)
°  [name] Progress Note    Assessment/ Plan:   # ESRD:  -outpatient orders: Thomas Robinson, MWF, 3.5hrs, F180, 400/800, 3k, 2.5Cal, UF profile 2, EDW 45.5kg. On mircera 221mcg q2weeks (last dose: 1/26) -noncompliant with HD times, typically does not do the full ordered treatment -HD tomorrow if still here   # Fall, rib fractures -per PMD   # Pleural effusion -per PMD, not anticipating thora   # Volume/ hypertension: EDW 45.5kg. Attempt to achieve EDW/UF as tolerated   # Anemia of Chronic Kidney Disease: Hemoglobin 9.1. Resumed ESAs here   # Secondary Hyperparathyroidism/Hyperphosphatemia: resume home binders, check phos    # Vascular access: TDC, per last VVS note-planning on doing AVG as an outpatient (last seen in Nov 2022)   # Protein Calorie malnutrition -per pmd, push protein   Thomas Quint, MD [facility]  Subjective:   No acute events. No complaints today. Same issues---signed off early on HD yesterday, only did about 1.75 hrs. Net uf 1250cc. To be d/c'ed today   Objective:   BP (!) 129/46 (BP Location: Left Leg)    Pulse 77    Temp 98.2 F (36.8 C) (Oral)    Resp 17    Ht 5\' 8"  (1.727 m)    Wt 59 kg    SpO2 94%    BMI 19.77 kg/m   Intake/Output Summary (Last 24 hours) at 10/17/2021 9169 Last data filed at 10/16/2021 1530 Gross per 24 hour  Intake --  Output 1250 ml  Net -1250 ml   Weight change:   Physical Exam: IHW:[id number] ill appearing, NAD, sitting up in bed CVS:rrr, s1s2 Resp:diminished air entry bibasilar, normal wob ZPH:XTAV, nt/nd Ext:no edema Neuro: awake, alert Dialysis access: [facility] c/d/i  Imaging: DG CHEST PORT 1 VIEW  Result Date: 10/17/2021 CLINICAL DATA:  Recurrent right pleural effusion. EXAM: PORTABLE CHEST 1 VIEW COMPARISON:  October 14, 2021. FINDINGS: Stable cardiomegaly. Right internal jugular dialysis catheter is unchanged. Stable probable loculated right pleural effusion is noted. Bilateral lung  opacities are noted concerning for edema. Bony thorax is unremarkable. IMPRESSION: Stable bilateral lung opacities concerning for edema. Stable probable loculated right pleural effusion. Electronically Signed   By: Marijo Conception M.D.   On: 10/17/2021 08:15    Labs: BMET Recent Labs  Lab 10/14/21 1705 10/15/21 0342  NA 140 139  K 4.8 5.3*  CL 99 100  CO2 27 24  GLUCOSE 78 77  BUN 34* 40*  CREATININE 5.02* 5.72*  CALCIUM 8.1* 7.9*   CBC Recent Labs  Lab 10/14/21 1705 10/15/21 0342  WBC 4.5 4.1  NEUTROABS 3.3  --   HGB 9.9* 9.1*  HCT 31.0* 29.5*  MCV 101.3* 103.5*  PLT 158 133*    Medications:     apixaban  2.5 mg Oral BID   Chlorhexidine Gluconate Cloth  6 each Topical Q0600   cinacalcet  60 mg Oral QPM   darbepoetin (ARANESP) injection - DIALYSIS  150 mcg Intravenous Q Wed-HD   diltiazem  300 mg Oral q morning   feeding supplement  237 mL Oral Daily   lidocaine  1 patch Transdermal Q24H   midodrine  10 mg Oral TID PC   multivitamin  1 tablet Oral QHS   sevelamer carbonate  800 mg Oral TID WC   sodium chloride flush  3 mL Intravenous Q12H      Thomas Quint, MD [facility] 10/17/2021, 9:27 AM

## 2021-10-21 ENCOUNTER — Telehealth: Payer: Self-pay

## 2021-10-21 NOTE — Telephone Encounter (Signed)
Contact number in chart is invalid. Spoke with patient on 2/10 and he states he would be getting a new number and did not know the number. I have left messages with patient's friend [name] with no response will close Palliative Care referral and notify referring provider.

## 2021-10-23 IMAGING — DX DG CHEST 1V PORT
1 series · 1 of 1 positions shown · non-contrast
Comparison: Prior chest radiographs 02/14/2021 and earlier.

CLINICAL DATA: Chest pain.

EXAM:
PORTABLE CHEST 1 VIEW

[chest ap]
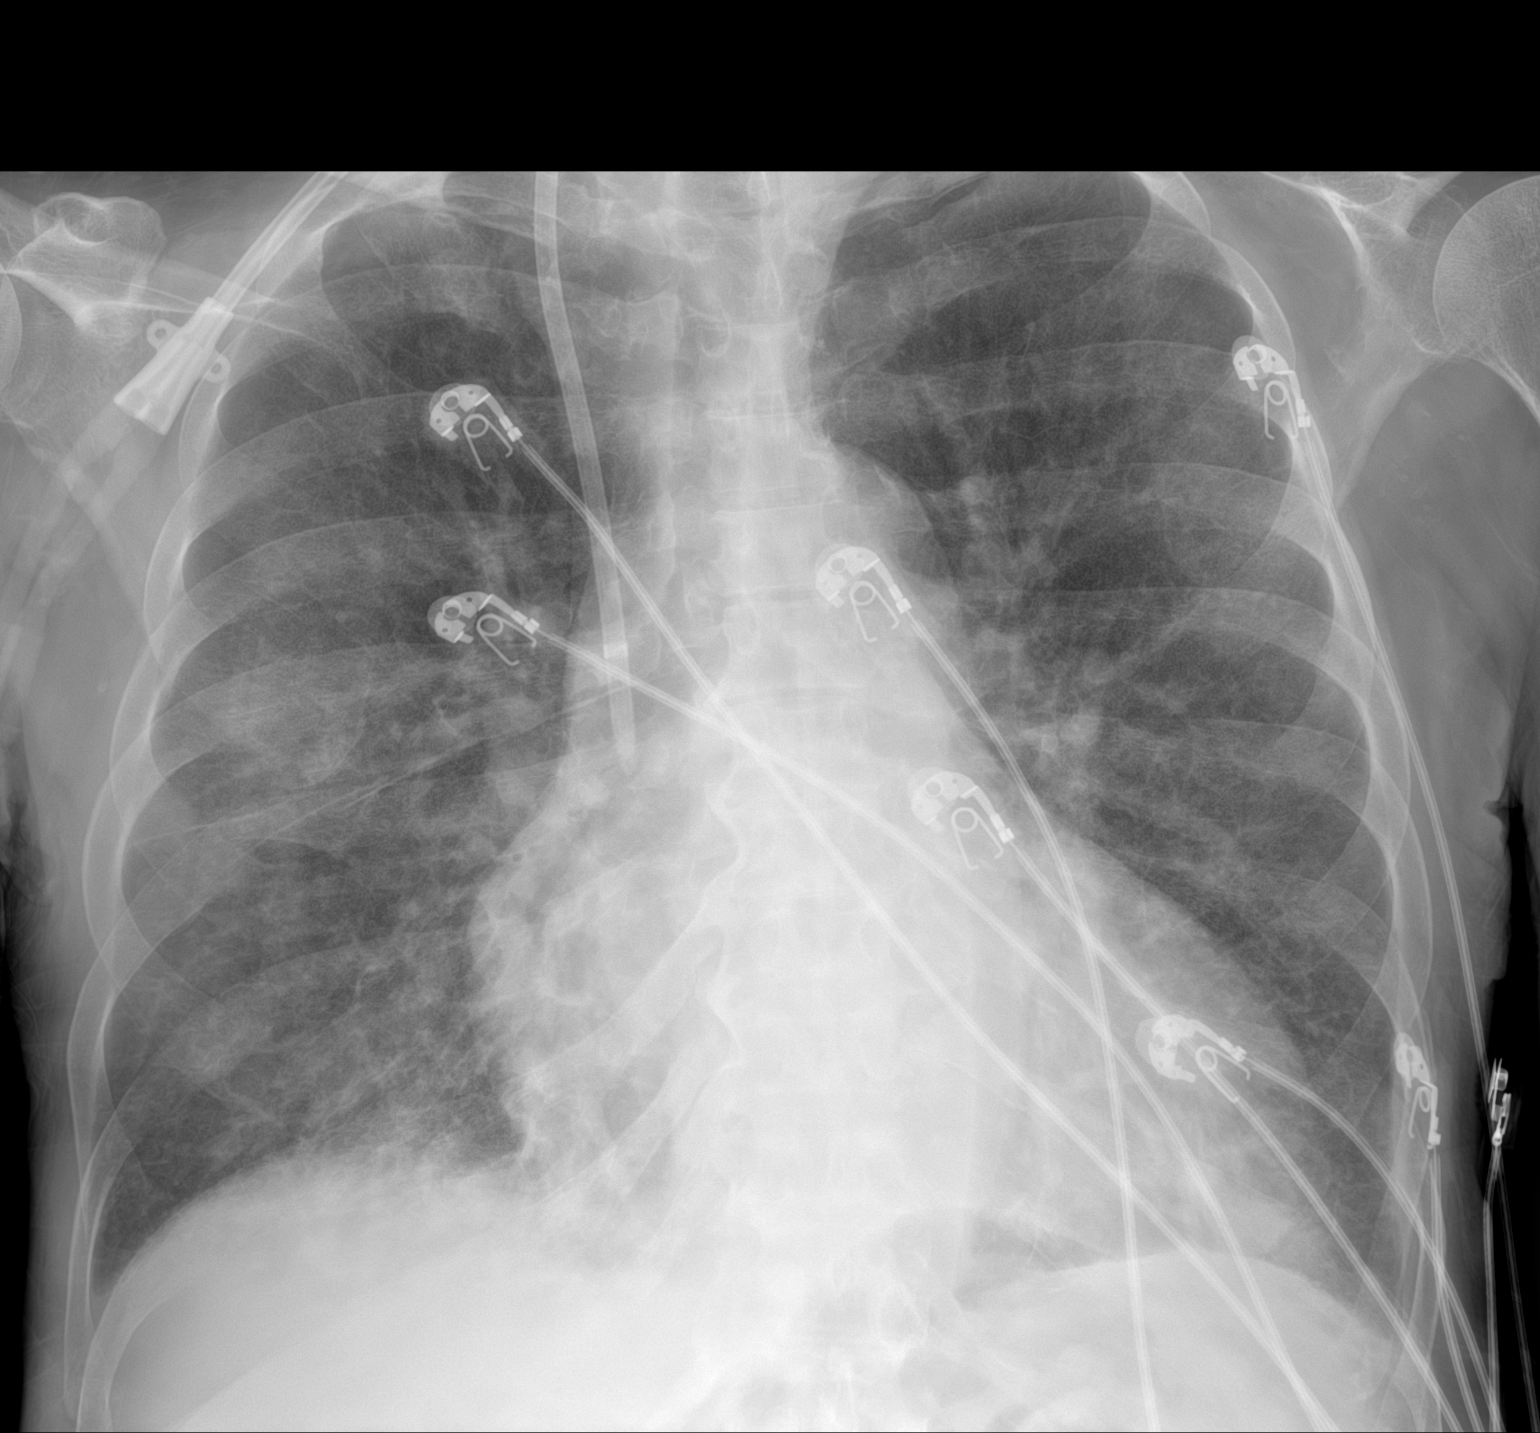

[1 of 1 positions shown; findings below may reference images not displayed]

FINDINGS: Unchanged position of a right-sided dialysis catheter with tip
projecting at the level of the superior cavoatrial junction.
Unchanged cardiomegaly and central pulmonary vascular congestion.
Aortic atherosclerosis. Mild ill-defined opacity within the right
lung base, new as compared to the prior examination. No evidence of
pleural effusion or pneumothorax. No acute bony abnormality
identified. Partially imaged ACDF hardware.
IMPRESSION: Unchanged cardiomegaly and central pulmonary vascular congestion.

Mild ill-defined opacity within the right lung base, new as compared
to the chest radiograph of 02/14/2021. This may reflect atelectasis.
Early pneumonia is difficult to exclude and clinical correlation is
recommended.

Aortic Atherosclerosis (EZ468-VD9.9).

## 2021-10-24 IMAGING — CT CT CERVICAL SPINE W/O CM
3 series · 13 of 33 positions shown, 16 images · non-contrast
Comparison: None.

CLINICAL DATA: Fall

EXAM:
CT HEAD WITHOUT CONTRAST
CT CERVICAL SPINE WITHOUT CONTRAST
TECHNIQUE: Multidetector CT imaging of the head and cervical spine was
performed following the standard protocol without intravenous
contrast. Multiplanar CT image reconstructions of the cervical spine
were also generated.

[Series 4: c spine soft · axial · 0.29mm/px · z∈[-272,-144]mm · 5 of 94 slices shown, 7 images]
[im 15/94  soft-tissue]
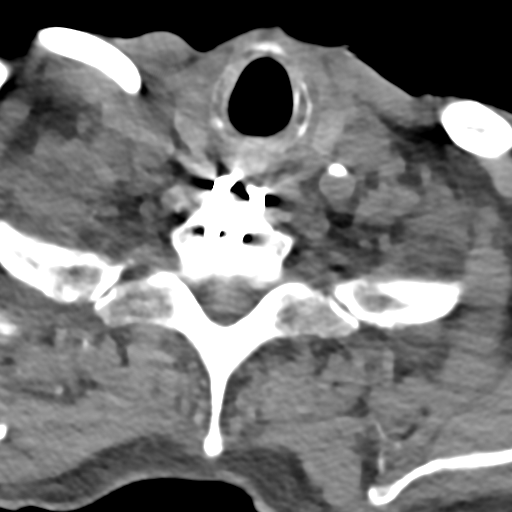
[im 15/94  bone]
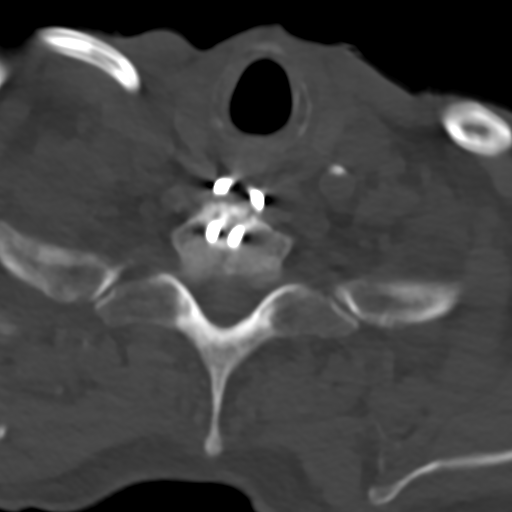
[im 29/94  bone]
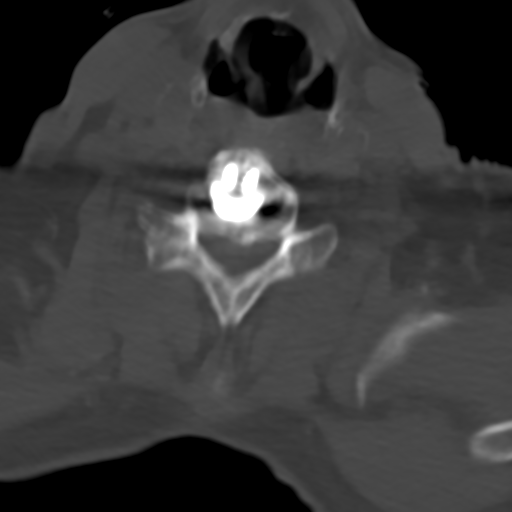
[im 51/94  bone]
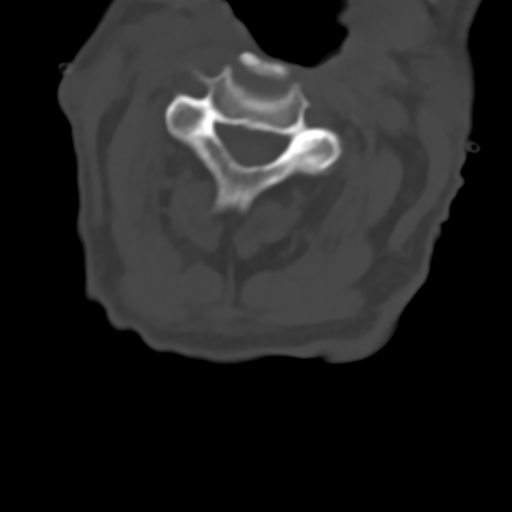
[im 65/94  bone]
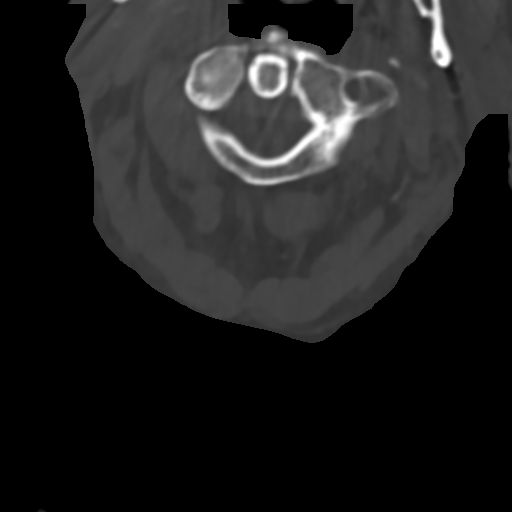
[im 79/94  soft-tissue]
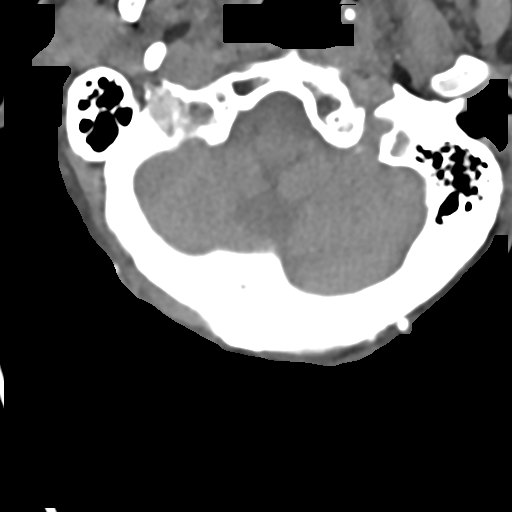
[im 79/94  bone]
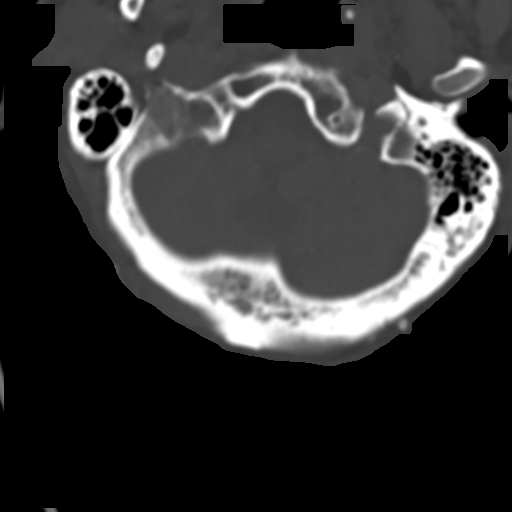

[Series 7: sag bone · sagittal · 0.36mm/px · 5 of 71 slices shown, 6 images]
[im 24/71  bone]
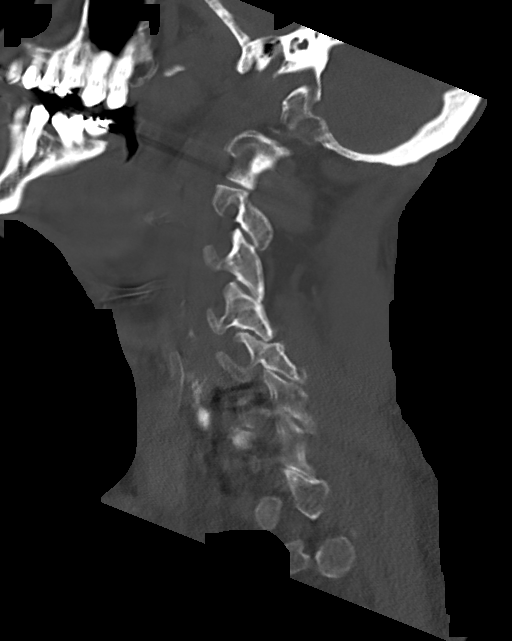
[im 30/71  bone]
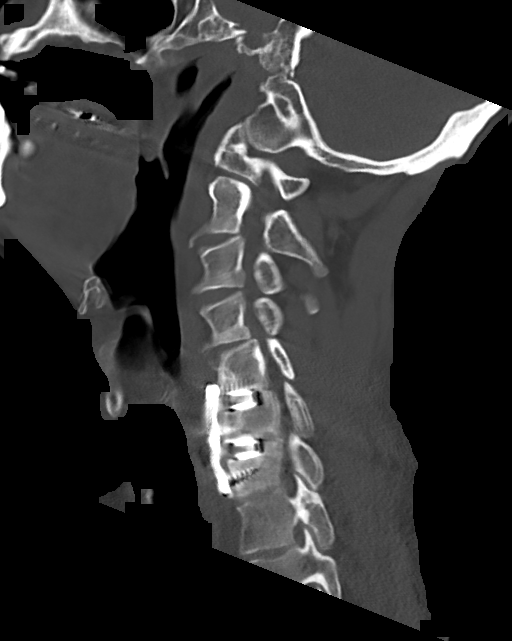
[im 36/71  soft-tissue]
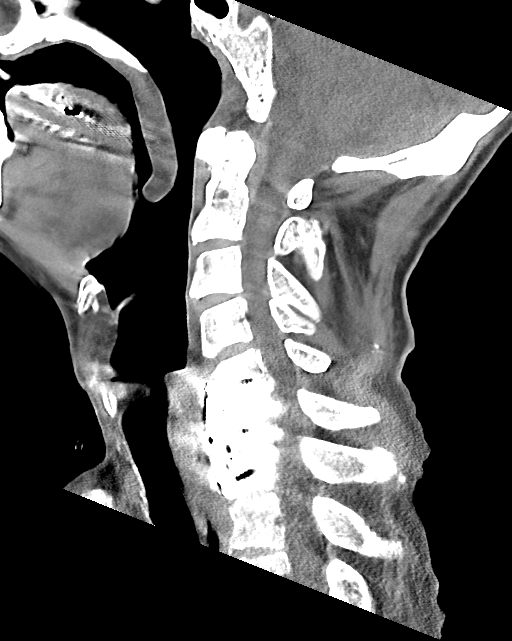
[im 36/71  bone]
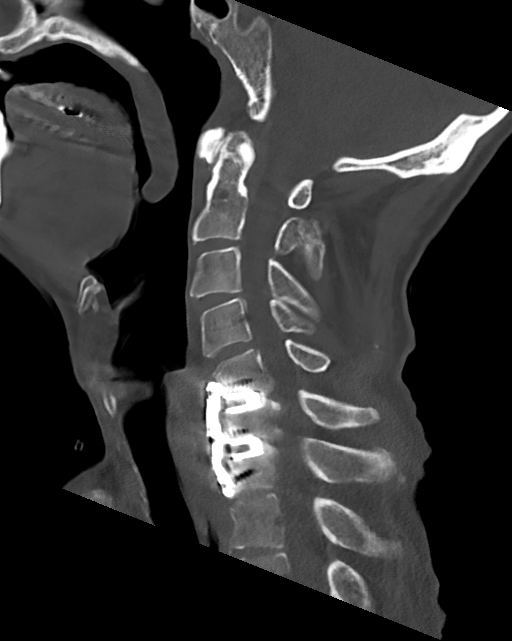
[im 41/71  bone]
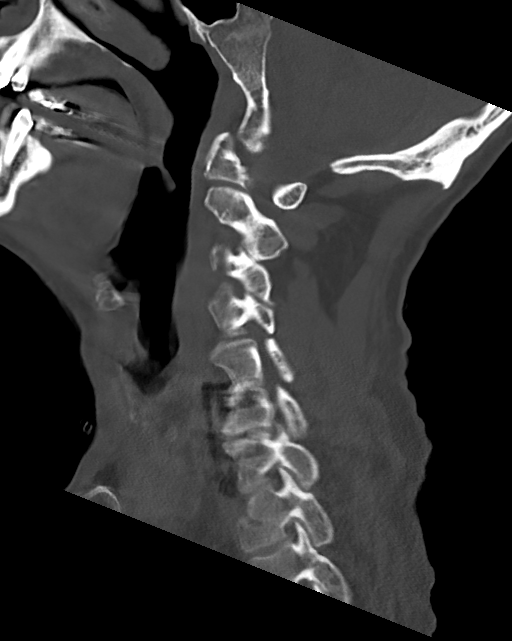
[im 47/71  bone]
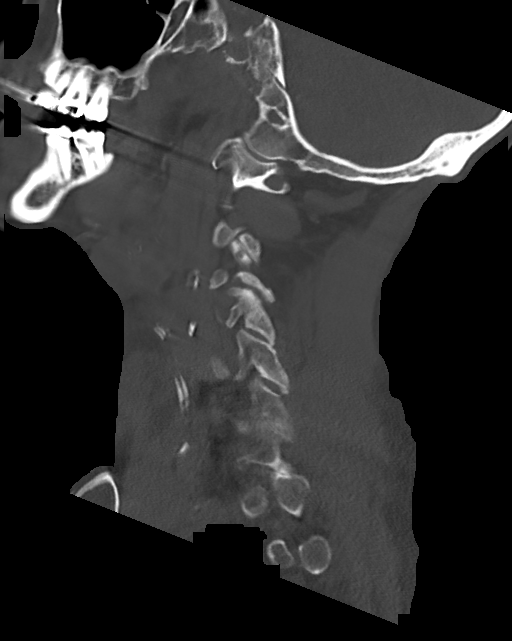

[Series 8: cor bone · coronal · 0.31mm/px · 3 of 72 slices shown]
[im 21/72  bone]
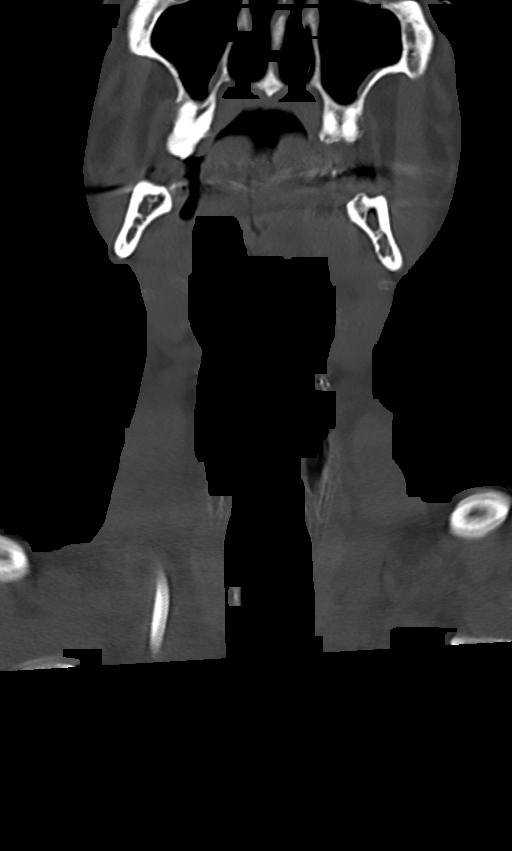
[im 31/72  bone]
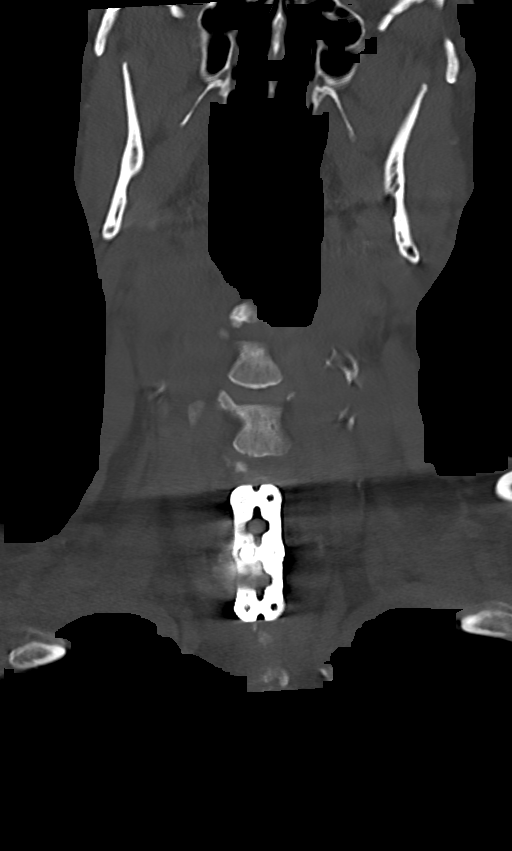
[im 41/72  bone]
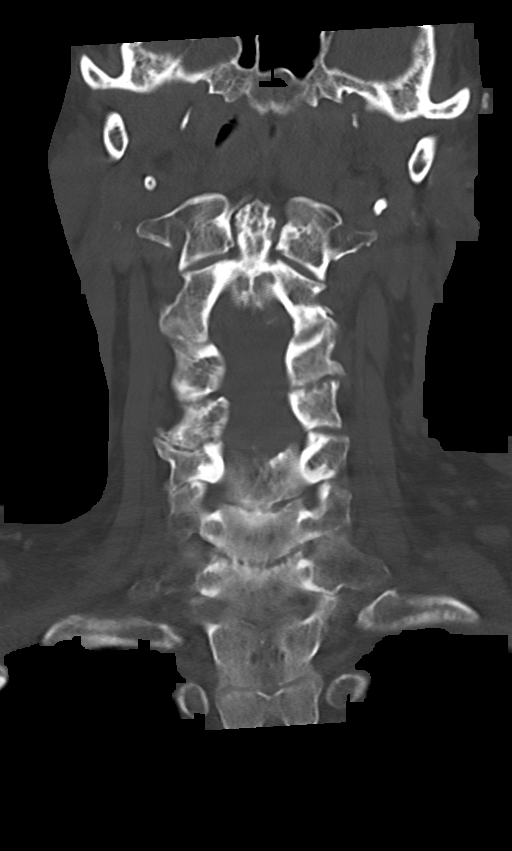

[13 of 33 positions shown; findings below may reference images not displayed]

FINDINGS: CT HEAD FINDINGS

Brain: There is no mass, hemorrhage or extra-axial collection. The
size and configuration of the ventricles and extra-axial CSF spaces
are normal. The brain parenchyma is normal, without evidence of
acute or chronic infarction.

Vascular: No abnormal hyperdensity of the major intracranial
arteries or dural venous sinuses. No intracranial atherosclerosis.

Skull: The visualized skull base, calvarium and extracranial soft
tissues are normal.

Sinuses/Orbits: No fluid levels or advanced mucosal thickening of
the visualized paranasal sinuses. No mastoid or middle ear effusion.
The orbits are normal.

CT CERVICAL SPINE FINDINGS

Alignment: Reversal of lower cervical lordosis.

Skull base and vertebrae: C5-7 ACDF

Soft tissues and spinal canal: No prevertebral fluid or swelling. No
visible canal hematoma.

Disc levels: There is moderate spinal canal stenosis at C5-6 and
C6-7.

Upper chest: No pneumothorax, pulmonary nodule or pleural effusion.

Other: Normal visualized paraspinal cervical soft tissues.
IMPRESSION: 1. No acute intracranial abnormality.
2. No acute fracture or static subluxation of the cervical spine.
3. C5-7 ACDF with moderate spinal canal stenosis at C5-6 and C6-7.

## 2021-10-24 IMAGING — CT CT HEAD W/O CM
4 series · 16 of 47 positions shown, 18 images · non-contrast
Comparison: None.

CLINICAL DATA: Fall

EXAM:
CT HEAD WITHOUT CONTRAST
CT CERVICAL SPINE WITHOUT CONTRAST
TECHNIQUE: Multidetector CT imaging of the head and cervical spine was
performed following the standard protocol without intravenous
contrast. Multiplanar CT image reconstructions of the cervical spine
were also generated.

[Series 3: head wo · axial · 0.42mm/px · z∈[-149,-24]mm · 7 of 35 slices shown, 9 images]
[im 5/35  brain]
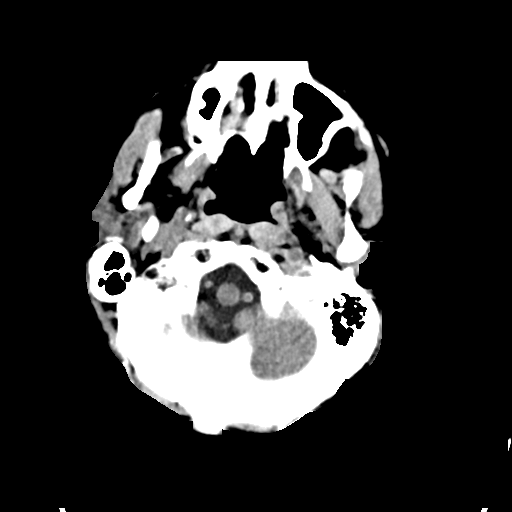
[im 5/35  bone]
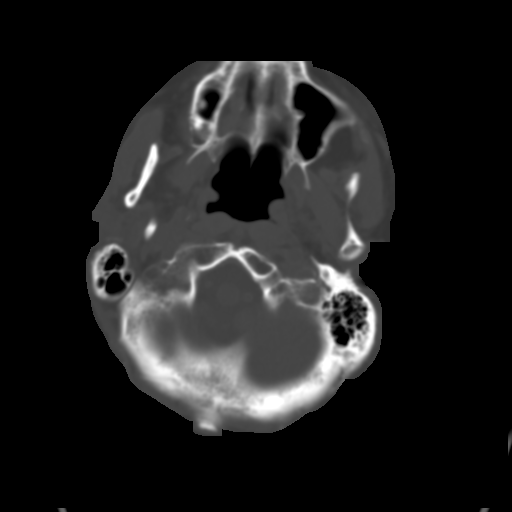
[im 9/35  brain]
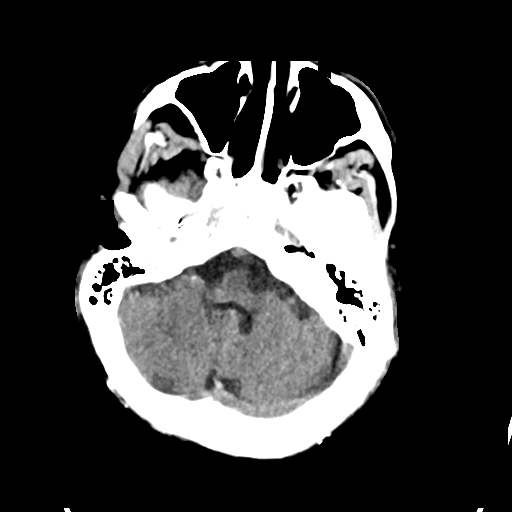
[im 13/35  brain]
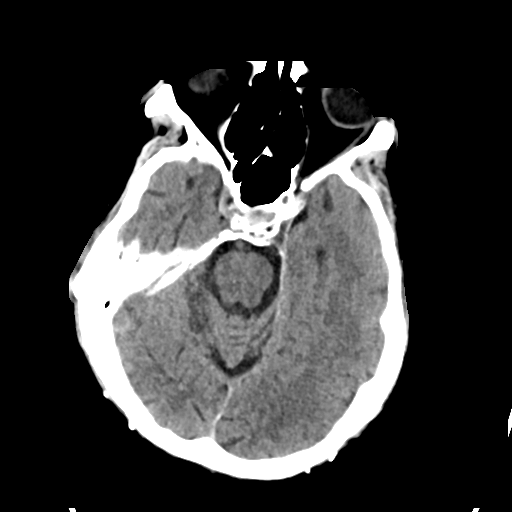
[im 18/35  brain]
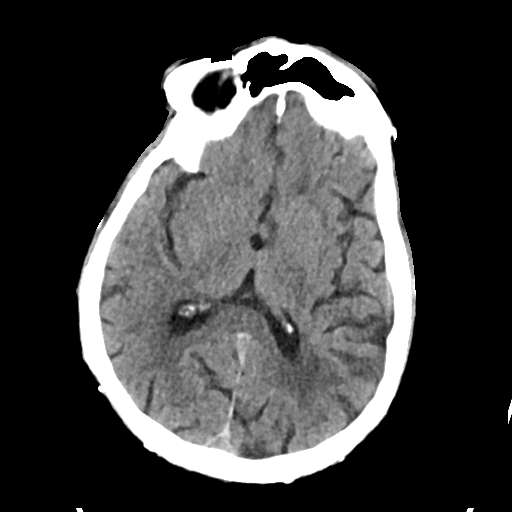
[im 22/35  brain]
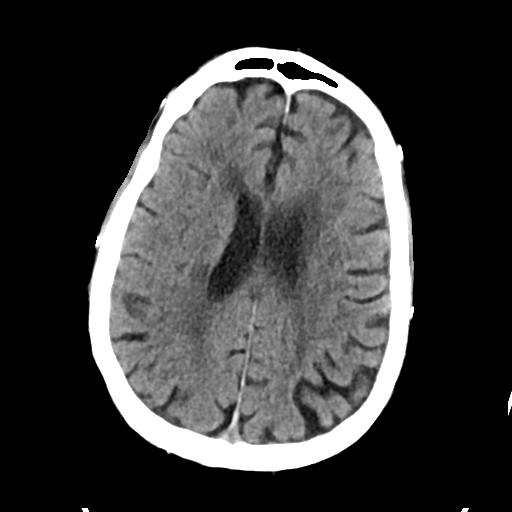
[im 22/35  bone]
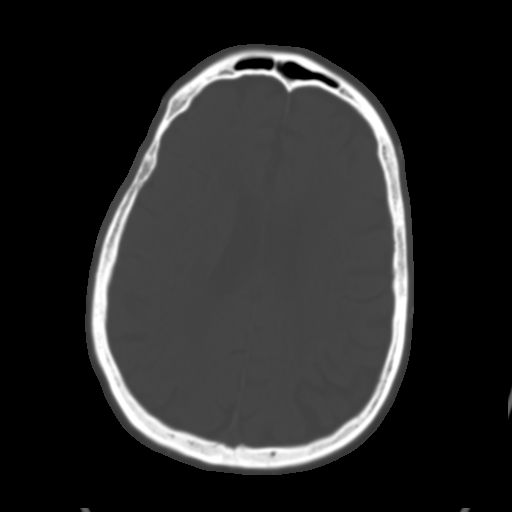
[im 26/35  brain]
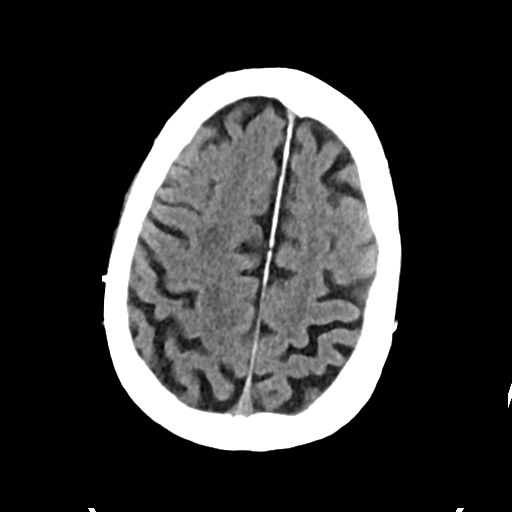
[im 30/35  brain]
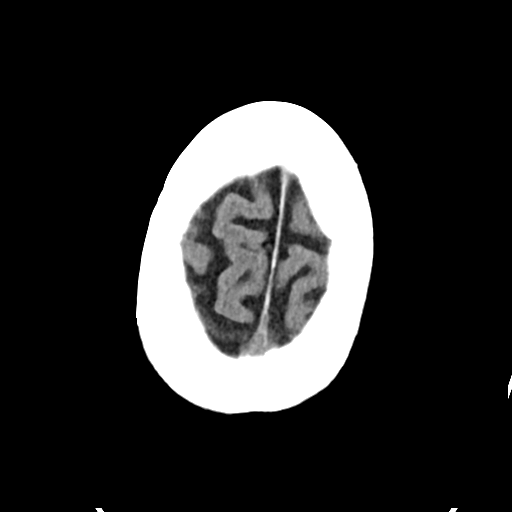

[Series 4: head bone · axial · 0.42mm/px · z∈[-153,-119]mm · 3 of 86 slices shown]
[im 9/86  bone]
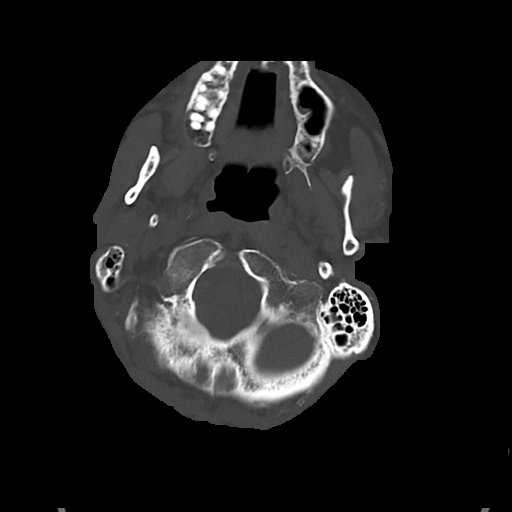
[im 18/86  bone]
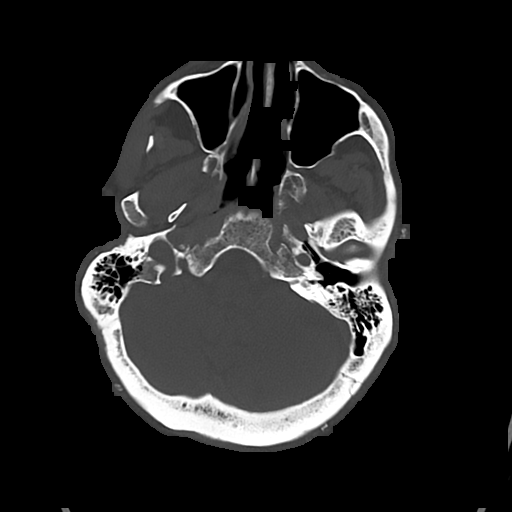
[im 26/86  bone]
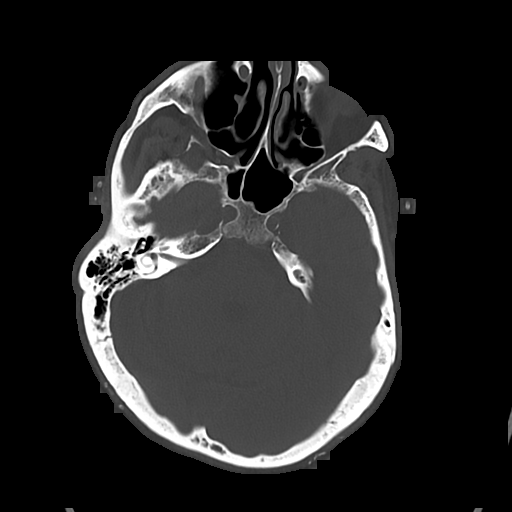

[Series 5: cor soft · coronal · 0.30mm/px · 3 of 94 slices shown]
[im 32/94  brain]
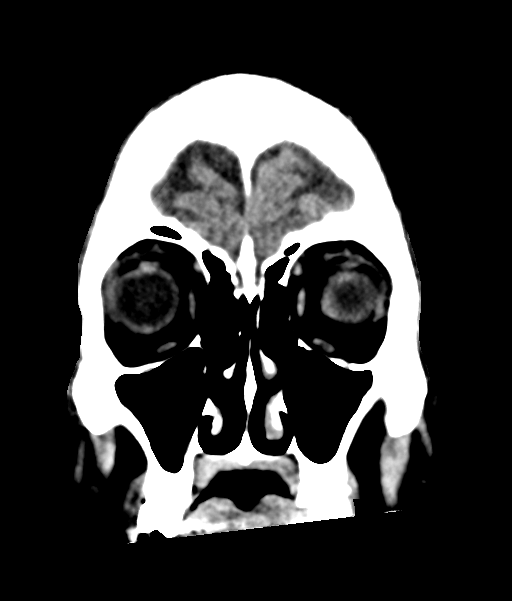
[im 42/94  brain]
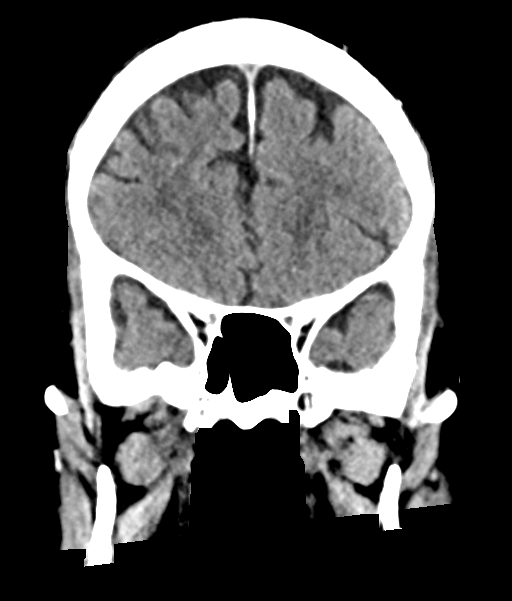
[im 52/94  brain]
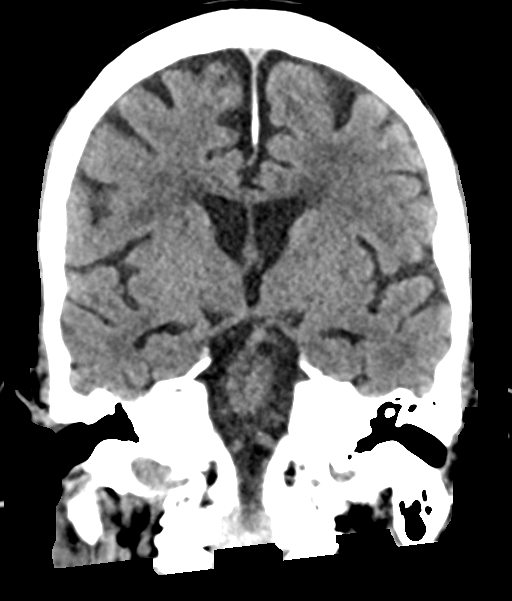

[Series 6: sag soft · sagittal · 0.36mm/px · 3 of 60 slices shown]
[im 20/60  brain]
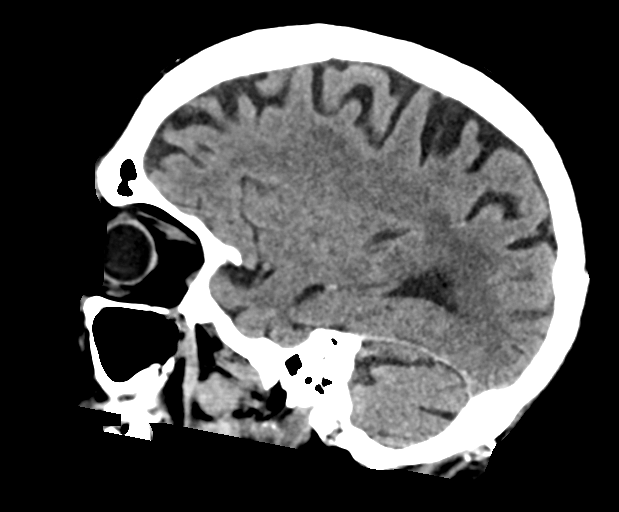
[im 30/60  brain]
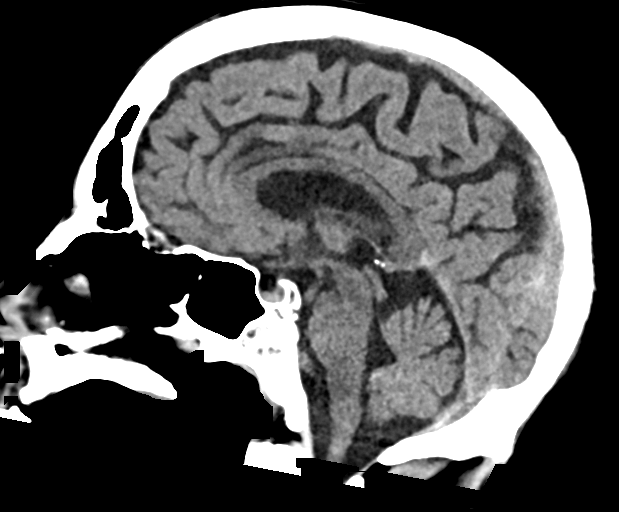
[im 40/60  brain]
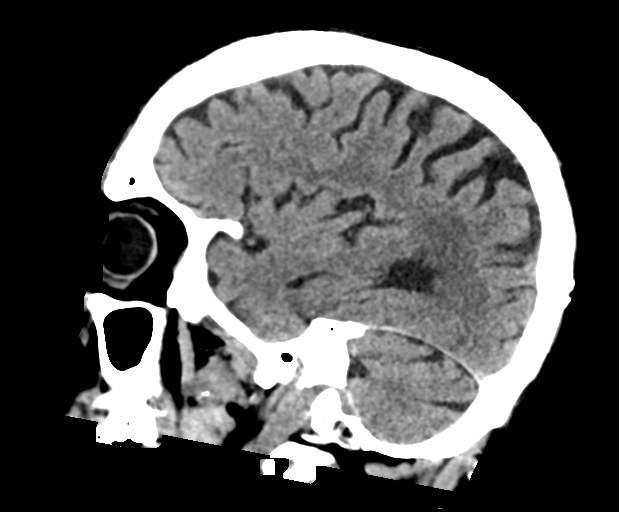

[16 of 47 positions shown; findings below may reference images not displayed]

FINDINGS: CT HEAD FINDINGS

Brain: There is no mass, hemorrhage or extra-axial collection. The
size and configuration of the ventricles and extra-axial CSF spaces
are normal. The brain parenchyma is normal, without evidence of
acute or chronic infarction.

Vascular: No abnormal hyperdensity of the major intracranial
arteries or dural venous sinuses. No intracranial atherosclerosis.

Skull: The visualized skull base, calvarium and extracranial soft
tissues are normal.

Sinuses/Orbits: No fluid levels or advanced mucosal thickening of
the visualized paranasal sinuses. No mastoid or middle ear effusion.
The orbits are normal.

CT CERVICAL SPINE FINDINGS

Alignment: Reversal of lower cervical lordosis.

Skull base and vertebrae: C5-7 ACDF

Soft tissues and spinal canal: No prevertebral fluid or swelling. No
visible canal hematoma.

Disc levels: There is moderate spinal canal stenosis at C5-6 and
C6-7.

Upper chest: No pneumothorax, pulmonary nodule or pleural effusion.

Other: Normal visualized paraspinal cervical soft tissues.
IMPRESSION: 1. No acute intracranial abnormality.
2. No acute fracture or static subluxation of the cervical spine.
3. C5-7 ACDF with moderate spinal canal stenosis at C5-6 and C6-7.

## 2021-10-24 IMAGING — DX DG CHEST 1V PORT
1 series · 2 of 2 positions shown · non-contrast
Comparison: None.

CLINICAL DATA: Respiratory distress.

EXAM:
PORTABLE CHEST 1 VIEW

[Series 1: chest · 0.14mm/px · 2 of 2 slices shown]
[im 1/2]
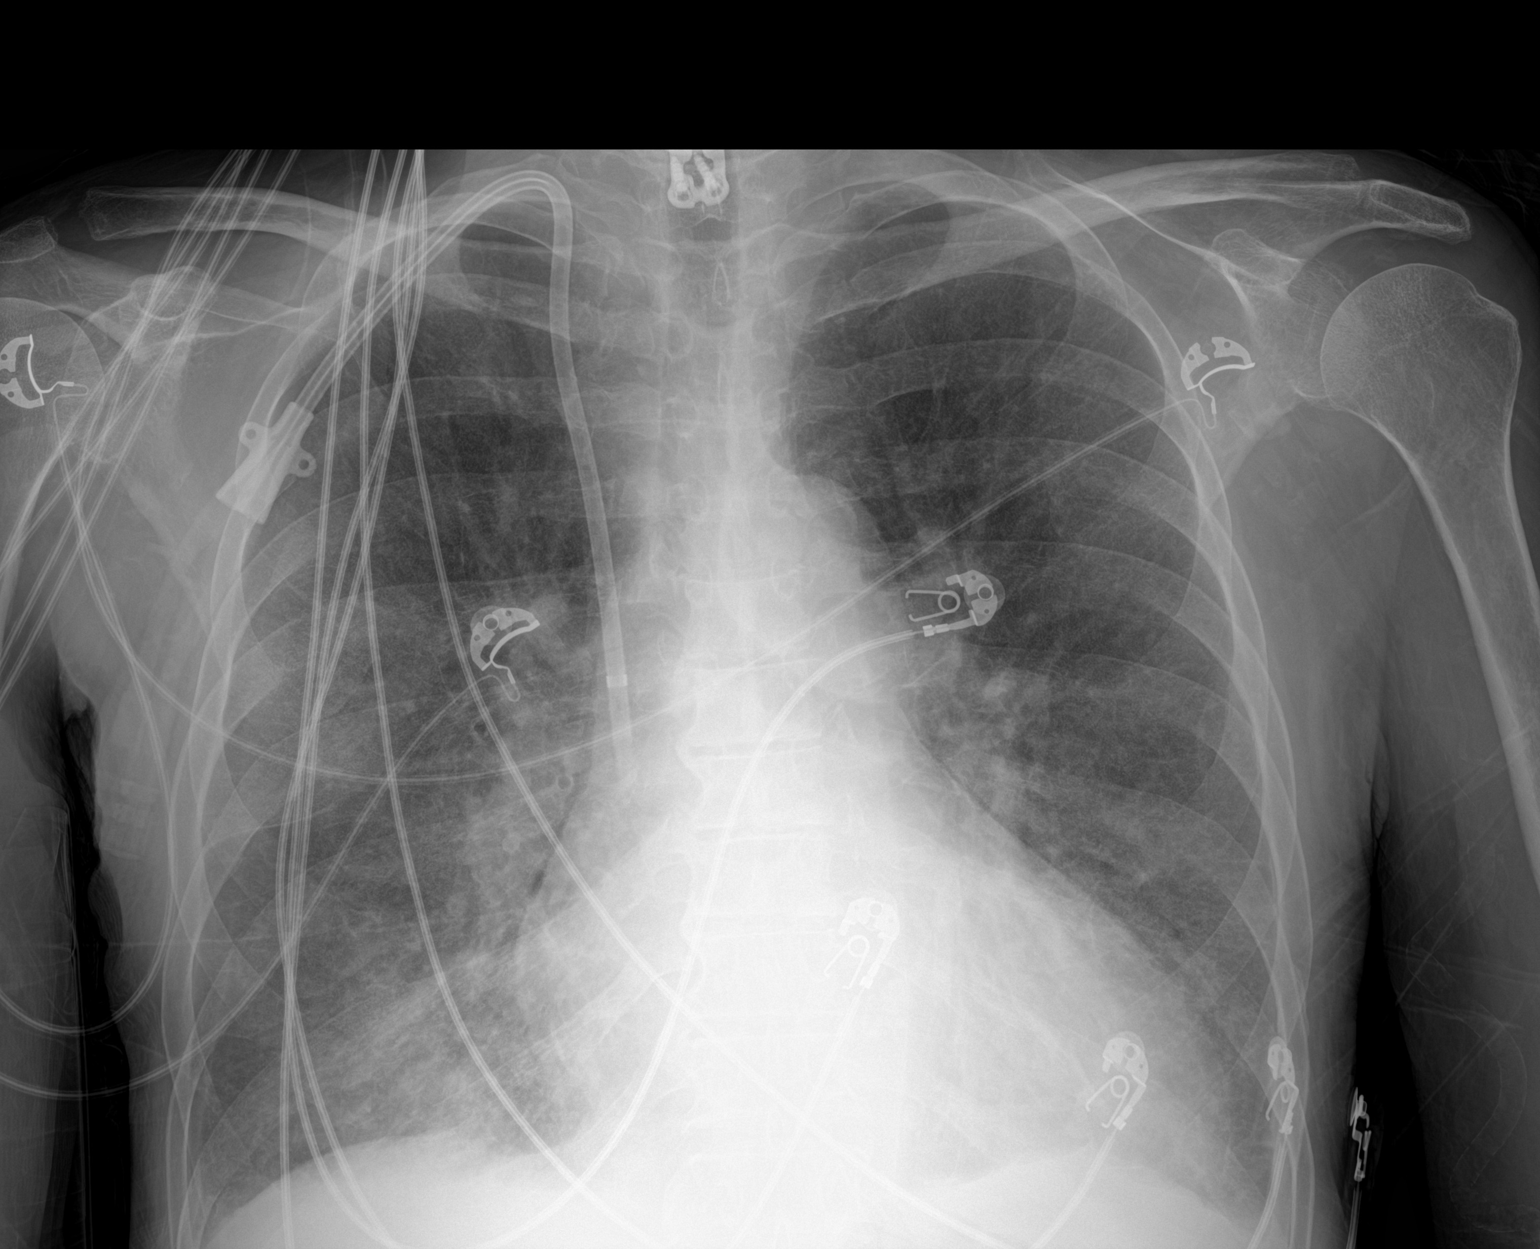
[im 2/2]
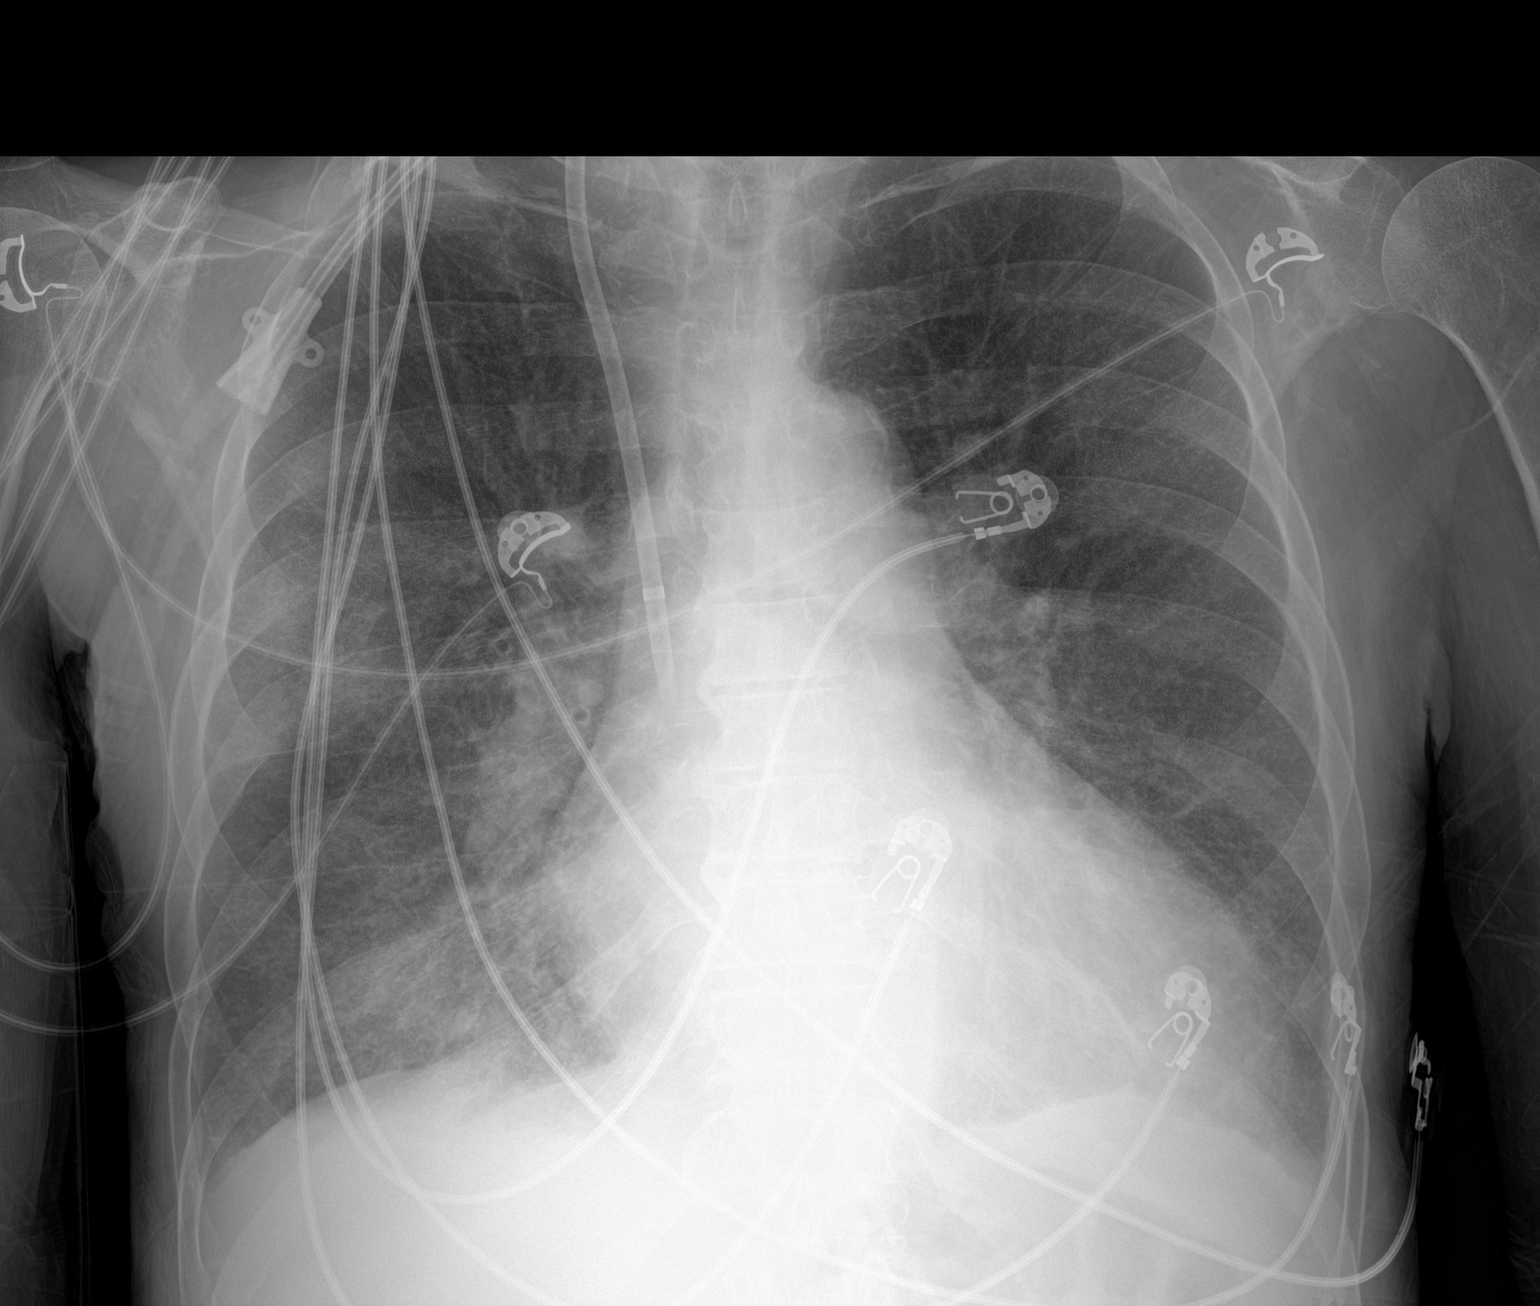

[2 of 2 positions shown; findings below may reference images not displayed]

FINDINGS: Right chest wall dialysis catheter with tip overlying the expected
region of the superior cavoatrial junction. Redemonstration of hilar
vasculature prominence.

The heart size and mediastinal contours are unchanged. Aortic arch
calcification.

Similar patchy airspace opacities within the right mid and lower
lung zones. No pulmonary edema. No pleural effusion. No
pneumothorax. Mach effect along the right heart border.

No acute osseous abnormality.  Cervical spine surgical hardware.
IMPRESSION: Similar patchy airspace opacities within the right mid and lower
lung zones. Finding could represent infection/inflammation.

## 2021-11-15 ENCOUNTER — Emergency Department (HOSPITAL_COMMUNITY): Payer: Medicare Other

## 2021-11-15 ENCOUNTER — Inpatient Hospital Stay (HOSPITAL_COMMUNITY)
Admission: EM | Admit: 2021-11-15 | Discharge: 2021-11-23 | DRG: 291 | Disposition: A | Discharge status: Home Health | Payer: Medicare Other | Attending: Internal Medicine | Admitting: Internal Medicine

## 2021-11-15 ENCOUNTER — Other Ambulatory Visit: Payer: Self-pay

## 2021-11-15 ENCOUNTER — Inpatient Hospital Stay (HOSPITAL_COMMUNITY): Payer: Medicare Other

## 2021-11-15 DIAGNOSIS — G2581 Restless legs syndrome: Secondary | ICD-10-CM | POA: Diagnosis present

## 2021-11-15 DIAGNOSIS — I272 Pulmonary hypertension, unspecified: Secondary | ICD-10-CM | POA: Diagnosis present

## 2021-11-15 DIAGNOSIS — I7121 Aneurysm of the ascending aorta, without rupture: Secondary | ICD-10-CM | POA: Diagnosis present

## 2021-11-15 DIAGNOSIS — I5033 Acute on chronic diastolic (congestive) heart failure: Secondary | ICD-10-CM | POA: Diagnosis present

## 2021-11-15 DIAGNOSIS — R64 Cachexia: Secondary | ICD-10-CM | POA: Diagnosis present

## 2021-11-15 DIAGNOSIS — J90 Pleural effusion, not elsewhere classified: Secondary | ICD-10-CM | POA: Diagnosis not present

## 2021-11-15 DIAGNOSIS — Z66 Do not resuscitate: Secondary | ICD-10-CM | POA: Diagnosis present

## 2021-11-15 DIAGNOSIS — Z20822 Contact with and (suspected) exposure to covid-19: Secondary | ICD-10-CM | POA: Diagnosis present

## 2021-11-15 DIAGNOSIS — Z79899 Other long term (current) drug therapy: Secondary | ICD-10-CM

## 2021-11-15 DIAGNOSIS — J9611 Chronic respiratory failure with hypoxia: Secondary | ICD-10-CM | POA: Diagnosis not present

## 2021-11-15 DIAGNOSIS — E8770 Fluid overload, unspecified: Secondary | ICD-10-CM | POA: Diagnosis present

## 2021-11-15 DIAGNOSIS — I132 Hypertensive heart and chronic kidney disease with heart failure and with stage 5 chronic kidney disease, or end stage renal disease: Principal | ICD-10-CM | POA: Diagnosis present

## 2021-11-15 DIAGNOSIS — K219 Gastro-esophageal reflux disease without esophagitis: Secondary | ICD-10-CM | POA: Diagnosis present

## 2021-11-15 DIAGNOSIS — D6959 Other secondary thrombocytopenia: Secondary | ICD-10-CM | POA: Diagnosis present

## 2021-11-15 DIAGNOSIS — Z8673 Personal history of transient ischemic attack (TIA), and cerebral infarction without residual deficits: Secondary | ICD-10-CM

## 2021-11-15 DIAGNOSIS — Z681 Body mass index (BMI) 19 or less, adult: Secondary | ICD-10-CM | POA: Diagnosis not present

## 2021-11-15 DIAGNOSIS — I480 Paroxysmal atrial fibrillation: Secondary | ICD-10-CM | POA: Diagnosis present

## 2021-11-15 DIAGNOSIS — N2581 Secondary hyperparathyroidism of renal origin: Secondary | ICD-10-CM | POA: Diagnosis present

## 2021-11-15 DIAGNOSIS — E43 Unspecified severe protein-calorie malnutrition: Secondary | ICD-10-CM | POA: Diagnosis present

## 2021-11-15 DIAGNOSIS — D631 Anemia in chronic kidney disease: Secondary | ICD-10-CM | POA: Diagnosis present

## 2021-11-15 DIAGNOSIS — J9621 Acute and chronic respiratory failure with hypoxia: Secondary | ICD-10-CM | POA: Diagnosis not present

## 2021-11-15 DIAGNOSIS — E875 Hyperkalemia: Secondary | ICD-10-CM | POA: Diagnosis present

## 2021-11-15 DIAGNOSIS — Z809 Family history of malignant neoplasm, unspecified: Secondary | ICD-10-CM

## 2021-11-15 DIAGNOSIS — Z992 Dependence on renal dialysis: Secondary | ICD-10-CM

## 2021-11-15 DIAGNOSIS — I3139 Other pericardial effusion (noninflammatory): Secondary | ICD-10-CM | POA: Diagnosis present

## 2021-11-15 DIAGNOSIS — N186 End stage renal disease: Secondary | ICD-10-CM | POA: Diagnosis present

## 2021-11-15 DIAGNOSIS — R0602 Shortness of breath: Secondary | ICD-10-CM | POA: Diagnosis present

## 2021-11-15 DIAGNOSIS — F418 Other specified anxiety disorders: Secondary | ICD-10-CM | POA: Insufficient documentation

## 2021-11-15 DIAGNOSIS — R079 Chest pain, unspecified: Secondary | ICD-10-CM | POA: Diagnosis not present

## 2021-11-15 DIAGNOSIS — Z7189 Other specified counseling: Secondary | ICD-10-CM | POA: Diagnosis not present

## 2021-11-15 DIAGNOSIS — Z9115 Patient's noncompliance with renal dialysis: Secondary | ICD-10-CM

## 2021-11-15 DIAGNOSIS — Z9981 Dependence on supplemental oxygen: Secondary | ICD-10-CM

## 2021-11-15 DIAGNOSIS — Z7901 Long term (current) use of anticoagulants: Secondary | ICD-10-CM | POA: Diagnosis not present

## 2021-11-15 DIAGNOSIS — R54 Age-related physical debility: Secondary | ICD-10-CM | POA: Diagnosis present

## 2021-11-15 DIAGNOSIS — D72818 Other decreased white blood cell count: Secondary | ICD-10-CM | POA: Diagnosis present

## 2021-11-15 DIAGNOSIS — Z981 Arthrodesis status: Secondary | ICD-10-CM

## 2021-11-15 DIAGNOSIS — Z91199 Patient's noncompliance with other medical treatment and regimen due to unspecified reason: Secondary | ICD-10-CM

## 2021-11-15 DIAGNOSIS — E785 Hyperlipidemia, unspecified: Secondary | ICD-10-CM | POA: Diagnosis present

## 2021-11-15 DIAGNOSIS — Z515 Encounter for palliative care: Secondary | ICD-10-CM | POA: Diagnosis not present

## 2021-11-15 LAB — HEPATITIS B SURFACE ANTIBODY,QUALITATIVE: Hep B S Ab: NONREACTIVE

## 2021-11-15 LAB — COMPREHENSIVE METABOLIC PANEL
ALT: 10 U/L (ref 0–44)
AST: 20 U/L (ref 15–41)
Albumin: 3.7 g/dL (ref 3.5–5.0)
Alkaline Phosphatase: 101 U/L (ref 38–126)
Anion gap: 17 — ABNORMAL HIGH (ref 5–15)
BUN: 45 mg/dL — ABNORMAL HIGH (ref 8–23)
CO2: 24 mmol/L (ref 22–32)
Calcium: 9.9 mg/dL (ref 8.9–10.3)
Chloride: 97 mmol/L — ABNORMAL LOW (ref 98–111)
Creatinine, Ser: 6.65 mg/dL — ABNORMAL HIGH (ref 0.61–1.24)
GFR, Estimated: 9 mL/min — ABNORMAL LOW (ref >60)
Glucose, Bld: 79 mg/dL (ref 70–99)
Potassium: 5.8 mmol/L — ABNORMAL HIGH (ref 3.5–5.1)
Sodium: 138 mmol/L (ref 135–145)
Total Bilirubin: 1.5 mg/dL — ABNORMAL HIGH (ref 0.3–1.2)
Total Protein: 7.9 g/dL (ref 6.5–8.1)

## 2021-11-15 LAB — CBC WITH DIFFERENTIAL/PLATELET
Abs Immature Granulocytes: 0 10*3/uL (ref 0.00–0.07)
Basophils Absolute: 0.1 10*3/uL (ref 0.0–0.1)
Basophils Relative: 1 %
Eosinophils Absolute: 0.1 10*3/uL (ref 0.0–0.5)
Eosinophils Relative: 4 %
HCT: 32.2 % — ABNORMAL LOW (ref 39.0–52.0)
Hemoglobin: 10 g/dL — ABNORMAL LOW (ref 13.0–17.0)
Immature Granulocytes: 0 %
Lymphocytes Relative: 13 %
Lymphs Abs: 0.5 10*3/uL — ABNORMAL LOW (ref 0.7–4.0)
MCH: 31.7 pg (ref 26.0–34.0)
MCHC: 31.1 g/dL (ref 30.0–36.0)
MCV: 102.2 fL — ABNORMAL HIGH (ref 80.0–100.0)
Monocytes Absolute: 0.3 10*3/uL (ref 0.1–1.0)
Monocytes Relative: 10 %
Neutro Abs: 2.6 10*3/uL (ref 1.7–7.7)
Neutrophils Relative %: 72 %
Platelets: 136 10*3/uL — ABNORMAL LOW (ref 150–400)
RBC: 3.15 MIL/uL — ABNORMAL LOW (ref 4.22–5.81)
RDW: 15 % (ref 11.5–15.5)
WBC: 3.6 10*3/uL — ABNORMAL LOW (ref 4.0–10.5)
nRBC: 0 % (ref 0.0–0.2)

## 2021-11-15 LAB — PHOSPHORUS: Phosphorus: 7.4 mg/dL — ABNORMAL HIGH (ref 2.5–4.6)

## 2021-11-15 LAB — HEPATITIS B SURFACE ANTIGEN: Hepatitis B Surface Ag: NONREACTIVE

## 2021-11-15 LAB — TROPONIN I (HIGH SENSITIVITY)
Troponin I (High Sensitivity): 50 ng/L — ABNORMAL HIGH (ref <18)
Troponin I (High Sensitivity): 55 ng/L — ABNORMAL HIGH (ref <18)

## 2021-11-15 LAB — BRAIN NATRIURETIC PEPTIDE: B Natriuretic Peptide: 2899.8 pg/mL — ABNORMAL HIGH (ref 0.0–100.0)

## 2021-11-15 LAB — CBG MONITORING, ED: Glucose-Capillary: 166 mg/dL — ABNORMAL HIGH (ref 70–99)

## 2021-11-15 MED ORDER — ACETAMINOPHEN 325 MG PO TABS
650.0000 mg | ORAL_TABLET | Freq: Four times a day (QID) | ORAL | Status: DC | PRN
  Administered 2021-11-16 – 2021-11-20 (×4): 650 mg via ORAL
  Filled 2021-11-15 (×4): qty 2

## 2021-11-15 MED ORDER — ACETAMINOPHEN 650 MG RE SUPP
650.0000 mg | Freq: Four times a day (QID) | RECTAL | Status: DC | PRN
  Administered 2021-11-15: 650 mg via RECTAL
  Filled 2021-11-15: qty 1

## 2021-11-15 MED ORDER — SENNOSIDES-DOCUSATE SODIUM 8.6-50 MG PO TABS
1.0000 | ORAL_TABLET | Freq: Every evening | ORAL | Status: DC | PRN
  Administered 2021-11-21: 1 via ORAL
  Filled 2021-11-15: qty 1

## 2021-11-15 MED ORDER — CALCIUM GLUCONATE-NACL 1-0.675 GM/50ML-% IV SOLN
1.0000 g | Freq: Once | INTRAVENOUS | Status: AC
  Administered 2021-11-15: 1000 mg via INTRAVENOUS
  Filled 2021-11-15: qty 50

## 2021-11-15 MED ORDER — SODIUM CHLORIDE 0.9 % IV SOLN: 100.0000 mL | INTRAVENOUS | Status: DC | PRN

## 2021-11-15 MED ORDER — DILTIAZEM HCL ER COATED BEADS 180 MG PO CP24
300.0000 mg | ORAL_CAPSULE | Freq: Every day | ORAL | Status: DC
  Administered 2021-11-16 – 2021-11-23 (×8): 300 mg via ORAL
  Filled 2021-11-15 (×8): qty 1

## 2021-11-15 MED ORDER — PENTAFLUOROPROP-TETRAFLUOROETH EX AERO
1.0000 "application " | INHALATION_SPRAY | CUTANEOUS | Status: DC | PRN
  Filled 2021-11-15: qty 116

## 2021-11-15 MED ORDER — INSULIN ASPART 100 UNIT/ML IV SOLN
5.0000 [IU] | Freq: Once | INTRAVENOUS | Status: AC
  Administered 2021-11-15: 5 [IU] via INTRAVENOUS

## 2021-11-15 MED ORDER — ALTEPLASE 2 MG IJ SOLR
2.0000 mg | Freq: Once | INTRAMUSCULAR | Status: DC | PRN
  Filled 2021-11-15: qty 2

## 2021-11-15 MED ORDER — SODIUM CHLORIDE 0.9% FLUSH
3.0000 mL | Freq: Two times a day (BID) | INTRAVENOUS | Status: DC
  Administered 2021-11-15 – 2021-11-23 (×14): 3 mL via INTRAVENOUS

## 2021-11-15 MED ORDER — CHLORHEXIDINE GLUCONATE CLOTH 2 % EX PADS
6.0000 | MEDICATED_PAD | Freq: Every day | CUTANEOUS | Status: DC
  Administered 2021-11-18 – 2021-11-23 (×5): 6 via TOPICAL

## 2021-11-15 MED ORDER — HYDROMORPHONE HCL 1 MG/ML IJ SOLN
1.0000 mg | INTRAMUSCULAR | Status: AC | PRN
  Administered 2021-11-16 (×2): 1 mg via INTRAVENOUS
  Filled 2021-11-15 (×2): qty 1

## 2021-11-15 MED ORDER — NITROGLYCERIN 0.4 MG SL SUBL
0.4000 mg | SUBLINGUAL_TABLET | SUBLINGUAL | Status: DC | PRN
  Administered 2021-11-15 (×2): 0.4 mg via SUBLINGUAL
  Filled 2021-11-15 (×2): qty 1

## 2021-11-15 MED ORDER — SODIUM ZIRCONIUM CYCLOSILICATE 10 G PO PACK
10.0000 g | PACK | Freq: Once | ORAL | Status: AC
  Administered 2021-11-15: 10 g via ORAL

## 2021-11-15 MED ORDER — CALCIUM GLUCONATE 10 % IV SOLN
1.0000 g | Freq: Once | INTRAVENOUS | Status: DC
  Filled 2021-11-15: qty 10

## 2021-11-15 MED ORDER — HEPARIN SODIUM (PORCINE) 1000 UNIT/ML DIALYSIS
1000.0000 [IU] | INTRAMUSCULAR | Status: DC | PRN
  Filled 2021-11-15 (×2): qty 1

## 2021-11-15 MED ORDER — DEXTROSE 50 % IV SOLN
1.0000 | Freq: Once | INTRAVENOUS | Status: AC
  Administered 2021-11-15: 50 mL via INTRAVENOUS
  Filled 2021-11-15: qty 50

## 2021-11-15 MED ORDER — LIDOCAINE HCL (PF) 1 % IJ SOLN: 5.0000 mL | INTRAMUSCULAR | Status: DC | PRN

## 2021-11-15 MED ORDER — SODIUM ZIRCONIUM CYCLOSILICATE 5 G PO PACK
5.0000 g | PACK | Freq: Once | ORAL | Status: DC
  Filled 2021-11-15: qty 1

## 2021-11-15 MED ORDER — APIXABAN 2.5 MG PO TABS
2.5000 mg | ORAL_TABLET | Freq: Two times a day (BID) | ORAL | Status: DC
  Administered 2021-11-16 – 2021-11-23 (×15): 2.5 mg via ORAL
  Filled 2021-11-15 (×15): qty 1

## 2021-11-15 MED ORDER — LIDOCAINE-PRILOCAINE 2.5-2.5 % EX CREA
1.0000 "application " | TOPICAL_CREAM | CUTANEOUS | Status: DC | PRN
  Filled 2021-11-15: qty 5

## 2021-11-15 MED ORDER — IOHEXOL 300 MG/ML  SOLN
100.0000 mL | Freq: Once | INTRAMUSCULAR | Status: AC | PRN
  Administered 2021-11-15: 100 mL via INTRAVENOUS

## 2021-11-15 MED ORDER — ENSURE ENLIVE PO LIQD
237.0000 mL | Freq: Two times a day (BID) | ORAL | Status: DC
  Administered 2021-11-17 – 2021-11-23 (×6): 237 mL via ORAL

## 2021-11-15 NOTE — Assessment & Plan Note (Addendum)
Acute on chronic bilateral pleural effusions, larger on right ?Acute on chronic HFpEF ? ?Patient had ultrafiltration this am with improvement in volume status but not back to baseline. ?Continue blood pressure monitoring.  ?Systolic this am 131 to 438 mmHg.  ? ? ?

## 2021-11-15 NOTE — ED Notes (Signed)
IV infiltrates after D50 administration. No pain reported at this time. IV removed and MD notified. IV called to attempt another IV. Dialysis consent signed, at bedside. ?

## 2021-11-15 NOTE — Progress Notes (Addendum)
Pt insist on taking the BIPAP off. Back on [place] at 5L. Saturating at 94%. Will monitor. ?10  min Off bipap, pt desats to 86%. Placed Bipap mask back on.  ?

## 2021-11-15 NOTE — ED Provider Notes (Signed)
?[facility] ?Provider Note ? ?CSN: [id number] ?Arrival date & time: 11/15/21 1540 ? ?Chief Complaint(s) ?Chest Pain ? ?HPI ?Thomas Robinson is a 61 y.o. male with PMH chronic hypoxic respiratory failure on 2 L nasal cannula home oxygen, chronic diastolic heart failure, ESRD on hemodialysis Monday Wednesday Friday, HTN, paroxysmal A-fib on Eliquis who presents emergency department for evaluation of chest pressure, arm pain and shortness of breath.  Patient states that he has had chest pressure starting last night at which he took a nitro and it improved, went to dialysis today and then had worsening of his chest pressure.  He states that it radiates down his bilateral arms left greater than right.  States that he received nitro from EMS and this improved his pain.  He states that this is since worn off and his pain is returned.  He denies abdominal pain, nausea, vomiting, diaphoresis or other systemic symptoms. ? ? ?Chest Pain ?Associated symptoms: shortness of breath   ? ?Past Medical History ?Past Medical History:  ?Diagnosis Date  ? Anaphylactic shock, unspecified, sequela 06/10/2019  ? Aortic insufficiency   ? Aortic stenosis   ? Arthritis   ? Asthma, chronic, unspecified asthma severity, with acute exacerbation 10/23/2017  ? CAP (community acquired pneumonia) 10/23/2017  ? Carpal tunnel syndrome of right wrist 10/05/2018  ? Cataract   ? right - removed by surgery  ? Cerebral infarction due to thrombosis of cerebral artery ([place])   ? Cervical disc herniation 01/04/2020  ? Chronic diastolic heart failure ([place]) 04/13/2018  ? Chronic low back pain 11/24/2019  ? Constipation   ? COPD (chronic obstructive pulmonary disease) ([place])   ? Cough   ? chronic cough  ? Encounter for immunization 07/08/2017  ? ESRD on hemodialysis ([place]) 02/16/2018  ? Fall 06/04/2019  ? GERD (gastroesophageal reflux disease) 06/04/2019  ? GI bleeding 05/24/2017  ? Gram-negative sepsis, unspecified ([place])  09/05/2017  ? History of fusion of cervical spine 03/26/2020  ? Hyperlipidemia   ? Hypertension   ? Hypokalemia 06/04/2017  ? Hypotension   ? Iron deficiency anemia, unspecified 09/09/2017  ? Left shoulder pain 11/11/2019  ? Lumbar radiculopathy 11/24/2019  ? Lung contusion 06/04/2019  ? LVH (left ventricular hypertrophy) due to hypertensive disease, with heart failure ([place]) 06/18/2020  ? Macrocytic anemia 05/24/2017  ? Mitral regurgitation   ? Moderate protein-calorie malnutrition ([place]) 06/06/2017  ? Myofascial pain syndrome 06/12/2020  ? Neuritis of right ulnar nerve 09/13/2018  ? Non-compliance with renal dialysis ([place]) 02/08/2020  ? Oxygen deficiency 12/30/2019  ? O2 sats on RA 87% at PAT appt   ? Pain in joint of right elbow 09/13/2018  ? Paroxysmal atrial flutter ([place])   ? PEA (Pulseless electrical activity) ([place])   ? after sedation in 07/2021  ? Pulmonary hypertension ([place])   ? Renovascular hypertension 06/22/2020  ? Restless leg syndrome   ? Rib fractures 06/2019  ? Right  ? S/P cardiac cath 08/27/2020  ? normal coronary arteries.  ? Secondary hyperparathyroidism of renal origin ([place]) 06/04/2017  ? Thoracic ascending aortic aneurysm   ? Tricuspid regurgitation   ? Ulnar neuropathy 10/05/2018  ? Volume overload 10/23/2017  ? ?Patient Active Problem List  ? Diagnosis Date Noted  ? Pleural effusion 10/15/2021  ? Left rib fracture 10/14/2021  ? Hypoxemia 10/14/2021  ? Pleural effusion, right 10/14/2021  ? Protein-calorie malnutrition, moderate ([place]) 10/14/2021  ? AF (paroxysmal atrial fibrillation) ([place]) 09/29/2021  ? Acute on chronic respiratory  failure with hypoxemia due to volume overload due to noncompliance with hemodialysis  09/19/2021  ? Persistent atrial fibrillation ([place]) 09/19/2021  ? Pulmonary emboli ([place]) 09/19/2021  ? Cachexia ([place]) 09/19/2021  ? Protein-calorie malnutrition, severe 09/10/2021  ? Chronic respiratory failure with hypoxia ([place]) 09/07/2021  ? Fever 09/07/2021  ? Hypotension 09/07/2021   ? Acute on chronic diastolic (congestive) heart failure ([place]) 08/26/2021  ? Fluid overload 08/25/2021  ? Atrial fibrillation with RVR ([place]) 08/25/2021  ? Sacral pressure sore 08/12/2021  ? Shortness of breath 07/16/2021  ? Balanitis 07/16/2021  ? Vomiting   ? Gastritis and gastroduodenitis   ? Grade III hemorrhoids   ? Decubitus ulcer of coccyx, stage 2 ([place]) 07/08/2021  ? Prolonged QT interval 07/08/2021  ? Hematochezia   ? Atrial flutter ([place])   ? Pressure injury of skin 07/02/2021  ? Abnormal chest CT 06/30/2021  ? Recurrent pleural effusion on right 06/30/2021  ? SIRS (systemic inflammatory response syndrome) ([place]) 06/30/2021  ? Acute hypoxemic respiratory failure ([place]) 05/18/2021  ? Bronchitis 05/18/2021  ? Preoperative cardiovascular examination 03/06/2021  ? CHF (congestive heart failure) ([place]) 02/17/2021  ? Cellulitis of arm, left 02/01/2021  ? Cellulitis of left lower limb 02/01/2021  ? Cellulitis, unspecified 02/01/2021  ? Left arm swelling 01/25/2021  ? Acute and chronic respiratory failure with hypoxia ([place]) 01/13/2021  ? Acute on chronic respiratory failure with hypoxia ([place]) 01/12/2021  ? Concussion with no loss of consciousness 01/12/2021  ? Laceration without foreign body of left forearm, initial encounter 01/12/2021  ? Age-related physical debility 09/20/2020  ? Allergy, unspecified, initial encounter 09/20/2020  ? Nausea 09/20/2020  ? Pain, unspecified 09/20/2020  ? Pruritus, unspecified 09/20/2020  ? S/P cardiac cath 08/27/2020  ? Acute encephalopathy 08/26/2020  ? Peripheral neuropathy 08/26/2020  ? Elevated troponin 08/25/2020  ? Hyperkalemia 08/05/2020  ? Arthritis   ? Asthma   ? Cataract   ? Cerebral infarction due to thrombosis of cerebral artery ([place])   ? History of CVA (cerebrovascular accident)   ? Chronic kidney disease   ? Dyspnea   ? Hyperlipidemia   ? Hypertension   ? Kidney failure   ? Restless leg syndrome   ? Degenerative lumbar spinal stenosis 06/26/2020  ? Diastolic dysfunction  19/50/9326  ? Renovascular hypertension 06/22/2020  ? Demand ischemia ([place]) 06/18/2020  ? LVH (left ventricular hypertrophy) due to hypertensive disease, with heart failure ([place]) 06/18/2020  ? Myofascial pain syndrome 06/12/2020  ? Encounter for removal of sutures 03/29/2020  ? History of fusion of cervical spine 03/26/2020  ? Non-compliance with renal dialysis ([place]) 02/08/2020  ? Pulmonary edema 01/23/2020  ? Cervical disc herniation 01/04/2020  ? Tachycardia 01/04/2020  ? SOB (shortness of breath)   ? Hypoxia 12/30/2019  ? Chronic low back pain 11/24/2019  ? Degeneration of lumbar intervertebral disc 11/24/2019  ? Lumbar radiculopathy 11/24/2019  ? Left shoulder pain 11/11/2019  ? Anaphylactic shock, unspecified, sequela 06/10/2019  ? Rib fractures 06/05/2019  ? Fall at home, initial encounter 06/04/2019  ? Right rib fracture 06/04/2019  ? Lung contusion 06/04/2019  ? Acute respiratory failure with hypoxia ([place]) 06/04/2019  ? Anemia in ESRD (end-stage renal disease) ([place]) 06/04/2019  ? GERD without esophagitis 06/04/2019  ? Chronic diastolic (congestive) heart failure ([place]) 06/04/2019  ? Fall 06/04/2019  ? Thoracic ascending aortic aneurysm 06/04/2019  ? Acute exacerbation of congestive heart failure ([place]) 05/16/2019  ? Carpal tunnel syndrome of right wrist 10/05/2018  ? Ulnar neuropathy 10/05/2018  ?  Neuritis of right ulnar nerve 09/13/2018  ? Pain in joint of right elbow 09/13/2018  ? Chest pain 04/13/2018  ? Chronic diastolic heart failure ([place]) 04/13/2018  ? Acute on chronic diastolic CHF (congestive heart failure) ([place]) 04/13/2018  ? Atypical chest pain 02/16/2018  ? ESRD on dialysis on HD MWF-but noncompliant 02/16/2018  ? End stage renal disease ([place]) 02/16/2018  ? CAP (community acquired pneumonia) 10/23/2017  ? Asthma, chronic, unspecified asthma severity, with acute exacerbation 10/23/2017  ? Respiratory failure, acute ([place]) 10/23/2017  ? Pulmonary hypertension ([place]) 10/23/2017  ? Iron deficiency anemia,  unspecified 09/09/2017  ? Gram-negative sepsis, unspecified ([place]) 09/05/2017  ? Encounter for immunization 07/08/2017  ? Moderate protein-calorie malnutrition ([place]) 06/06/2017  ? Coagulation defect, unspe

## 2021-11-15 NOTE — ED Notes (Signed)
IV team unsuccessful with peripheral IV placement for angio. MD made aware. ?

## 2021-11-15 NOTE — Consult Note (Signed)
Reason for Consult: ESRD ?Referring Physician: Dr. Debe Coder Kommor ? ?Chief Complaint:  Shortness of breath and chest pain ? ?Outpatient orders: [facility], MWF, 3.5hrs, F180, 400/800, 2k, 2.5Cal, UF profile 2, EDW 45.5kg. On mircera 165mg q2weeks (last dose: 3/16) ? ?Assessment/Plan: ?1) ESRD:  last HD was on 3/16 but only on for 1-1/2 hours. H/o severe noncompliance. His last treatments have been on March 1, 4, 6, 13, 16 and usually only stays for 1-1/2 hours. ?- Plan for HD tomorrow; will try for 3-1/2 hours and see how he tolerates. Well above his EDW. ?  ?2) Anemia of Chronic Kidney Disease: Hemoglobin 10. Last ESA given 3/16. ? ?3) Volume/ hypertension: EDW 45.5kg. Attempt to achieve EDW as tolerated + possible thoracentesis (not sure if there's enough to tap); accumulating bec of poor nutritional status and also [place] with dialysis treatments. ?  ?4)  Secondary Hyperparathyroidism/Hyperphosphatemia: resume home binders, check phos  ?  ?5)  Pleural effusion ?-per PMD, possible thora? Not sure there is enough. ?  ?6)  Vascular access: TDC, per last [facility] note-planning on doing AVG as an outpatient (last seen in Nov 2022) ?  ?7)  afib was on Eliquis ? ?8)  h/o  rib fractures ?-per PMD ? ?HPI: Thomas Robinson an 61y.o. male with a past medical history of ESRD on HD w/ noncompliance, chronic hypoxic respiratory failure on home o2 w/ pleural effusions, dCHF, HTN, pafib had been on Eliquis who presents with chest pressure described as 8/10 last night and arm pain as well left greater than right. The arm pain started end of dialysis.  He was given nitro by EMS which helped. He also has shortness of breath but denies cough, fever, chills, myalgias or sick contacts. He has history of signing off early at dialysis usually only staying for 1-1/2 hours out of a 3-1/2 hour treatment. He states that's because of the chest pressure.  ? ?ROS ?Pertinent items are noted in HPI. ? ?Chemistry and CBC: ?Creatinine, Ser  ?Date/Time  Value Ref Range Status  ?10/15/2021 03:42 AM 5.72 (H) 0.61 - 1.24 mg/dL Final  ?10/14/2021 05:05 PM 5.02 (H) 0.61 - 1.24 mg/dL Final  ?09/30/2021 03:23 AM 5.26 (H) 0.61 - 1.24 mg/dL Final  ?09/29/2021 03:44 AM 7.55 (H) 0.61 - 1.24 mg/dL Final  ?09/20/2021 08:13 AM 3.63 (H) 0.61 - 1.24 mg/dL Final  ?09/19/2021 07:50 PM 5.02 (H) 0.61 - 1.24 mg/dL Final  ?09/19/2021 02:43 PM 4.95 (H) 0.61 - 1.24 mg/dL Final  ?09/13/2021 07:50 AM 5.14 (H) 0.61 - 1.24 mg/dL Final  ?09/12/2021 09:28 AM 3.96 (H) 0.61 - 1.24 mg/dL Final  ?09/10/2021 11:22 AM 4.52 (H) 0.61 - 1.24 mg/dL Final  ?09/08/2021 02:14 AM 3.78 (H) 0.61 - 1.24 mg/dL Final  ?  Comment:  ?  DIALYSIS  ?09/07/2021 05:50 PM 6.30 (H) 0.61 - 1.24 mg/dL Final  ?09/07/2021 05:41 PM 6.29 (H) 0.61 - 1.24 mg/dL Final  ?08/28/2021 08:30 AM 4.30 (H) 0.61 - 1.24 mg/dL Final  ?  Comment:  ?  DIALYSIS  ?08/27/2021 08:32 AM 8.27 (H) 0.61 - 1.24 mg/dL Final  ?08/26/2021 10:59 AM 6.70 (H) 0.61 - 1.24 mg/dL Final  ?08/25/2021 07:30 PM 6.37 (H) 0.61 - 1.24 mg/dL Final  ?08/25/2021 07:30 PM 6.48 (H) 0.61 - 1.24 mg/dL Final  ?08/12/2021 01:53 AM 5.70 (H) 0.61 - 1.24 mg/dL Final  ?08/11/2021 06:37 PM 5.28 (H) 0.61 - 1.24 mg/dL Final  ?07/26/2021 03:19 AM 3.31 (H) 0.61 - 1.24 mg/dL Final  ?07/25/2021  08:30 AM 2.36 (H) 0.61 - 1.24 mg/dL Final  ?07/25/2021 03:40 AM 3.20 (H) 0.61 - 1.24 mg/dL Final  ?07/24/2021 07:53 PM 3.16 (H) 0.61 - 1.24 mg/dL Final  ?07/24/2021 04:00 AM 2.32 (H) 0.61 - 1.24 mg/dL Final  ?07/23/2021 05:14 PM 1.73 (H) 0.61 - 1.24 mg/dL Final  ?07/23/2021 04:31 AM 1.36 (H) 0.61 - 1.24 mg/dL Final  ?07/22/2021 04:31 PM 1.41 (H) 0.61 - 1.24 mg/dL Final  ?07/22/2021 04:54 AM 1.64 (H) 0.61 - 1.24 mg/dL Final  ?07/22/2021 03:53 AM 1.61 (H) 0.61 - 1.24 mg/dL Final  ?07/21/2021 04:00 AM 2.31 (H) 0.61 - 1.24 mg/dL Final  ?  Comment:  ?  DELTA CHECK NOTED  ?07/20/2021 04:25 AM 4.77 (H) 0.61 - 1.24 mg/dL Final  ?07/20/2021 04:25 AM 4.68 (H) 0.61 - 1.24 mg/dL Final  ?07/20/2021 04:25 AM  4.77 (H) 0.61 - 1.24 mg/dL Final  ?07/19/2021 03:38 AM 8.33 (H) 0.61 - 1.24 mg/dL Final  ?07/18/2021 03:48 AM 7.38 (H) 0.61 - 1.24 mg/dL Final  ?07/17/2021 02:32 AM 10.45 (H) 0.61 - 1.24 mg/dL Final  ?07/16/2021 09:22 AM 10.28 (H) 0.61 - 1.24 mg/dL Final  ?07/08/2021 06:08 AM 8.29 (H) 0.61 - 1.24 mg/dL Final  ?07/07/2021 08:25 PM 7.79 (H) 0.61 - 1.24 mg/dL Final  ?07/05/2021 06:14 AM 4.97 (H) 0.61 - 1.24 mg/dL Final  ?07/04/2021 02:49 AM 7.22 (H) 0.61 - 1.24 mg/dL Final  ?07/03/2021 01:31 PM 6.78 (H) 0.61 - 1.24 mg/dL Final  ?07/03/2021 04:09 AM 6.46 (H) 0.61 - 1.24 mg/dL Final  ?07/02/2021 04:00 PM 5.52 (H) 0.61 - 1.24 mg/dL Final  ?07/02/2021 04:20 AM 7.90 (H) 0.61 - 1.24 mg/dL Final  ?07/01/2021 01:29 PM 9.22 (H) 0.61 - 1.24 mg/dL Final  ?06/30/2021 06:58 AM 7.63 (H) 0.61 - 1.24 mg/dL Final  ?06/29/2021 10:37 PM 6.71 (H) 0.61 - 1.24 mg/dL Final  ?06/01/2021 08:39 AM 4.89 (H) 0.61 - 1.24 mg/dL Final  ?05/31/2021 03:52 AM 6.36 (H) 0.61 - 1.24 mg/dL Final  ?05/31/2021 12:22 AM 6.08 (H) 0.61 - 1.24 mg/dL Final  ? ?No results for input(s): NA, K, CL, CO2, GLUCOSE, BUN, CREATININE, CALCIUM, PHOS in the last 168 hours. ? ?Invalid input(s): ALB ?Recent Labs  ?Lab 11/15/21 ?1642  ?WBC 3.6*  ?NEUTROABS 2.6  ?HGB 10.0*  ?HCT 32.2*  ?MCV 102.2*  ?PLT 136*  ? ?Liver Function Tests: ?No results for input(s): AST, ALT, ALKPHOS, BILITOT, PROT, ALBUMIN in the last 168 hours. ?No results for input(s): LIPASE, AMYLASE in the last 168 hours. ?No results for input(s): AMMONIA in the last 168 hours. ?Cardiac Enzymes: ?No results for input(s): CKTOTAL, CKMB, CKMBINDEX, TROPONINI in the last 168 hours. ?Iron Studies: No results for input(s): IRON, TIBC, TRANSFERRIN, FERRITIN in the last 72 hours. ?PT/INR: ?'@LABRCNTIP'$ (inr:5) ? ?Xrays/Other Studies: ?) ?Results for orders placed or performed during the hospital encounter of 11/15/21 (from the past 48 hour(s))  ?CBC with Differential     Status: Abnormal  ? Collection Time: 11/15/21  4:42  PM  ?Result Value Ref Range  ? WBC 3.6 (L) 4.0 - 10.5 K/uL  ? RBC 3.15 (L) 4.22 - 5.81 MIL/uL  ? Hemoglobin 10.0 (L) 13.0 - 17.0 g/dL  ? HCT 32.2 (L) 39.0 - 52.0 %  ? MCV 102.2 (H) 80.0 - 100.0 fL  ? MCH 31.7 26.0 - 34.0 pg  ? MCHC 31.1 30.0 - 36.0 g/dL  ? RDW 15.0 11.5 - 15.5 %  ? Platelets 136 (L) 150 - 400 K/uL  ? nRBC 0.0 0.0 -  0.2 %  ? Neutrophils Relative % 72 %  ? Neutro Abs 2.6 1.7 - 7.7 K/uL  ? Lymphocytes Relative 13 %  ? Lymphs Abs 0.5 (L) 0.7 - 4.0 K/uL  ? Monocytes Relative 10 %  ? Monocytes Absolute 0.3 0.1 - 1.0 K/uL  ? Eosinophils Relative 4 %  ? Eosinophils Absolute 0.1 0.0 - 0.5 K/uL  ? Basophils Relative 1 %  ? Basophils Absolute 0.1 0.0 - 0.1 K/uL  ? Immature Granulocytes 0 %  ? Abs Immature Granulocytes 0.00 0.00 - 0.07 K/uL  ?  Comment: Performed at [facility], [place] [address] [place], [place] [address]  ? ?DG Chest 2 View ? ?Result Date: 11/15/2021 ?CLINICAL DATA:  Dyspnea, chest pain, weakness, onset of symptoms following dialysis yesterday, increased shortness of breath this morning, vomiting EXAM: CHEST - 2 VIEW COMPARISON:  10/17/2021 FINDINGS: RIGHT jugular catheter with tip projecting over SVC. Enlargement of cardiac silhouette with pulmonary vascular congestion. Diffuse BILATERAL pulmonary infiltrates question pulmonary edema though in multifocal infection not completely excluded. Persistent BILATERAL pleural effusions and basilar atelectasis greater on RIGHT. Atherosclerotic calcification aorta. No pneumothorax. Prior cervical spine fusion IMPRESSION: Enlargement of cardiac silhouette with pulmonary vascular congestion and probable pulmonary edema though multifocal infection not completely excluded. Bibasilar effusions and atelectasis RIGHT greater than LEFT. Electronically Signed   By: Lavonia Dana M.D.   On: 11/15/2021 16:56   ? ?PMH:   ?Past Medical History:  ?Diagnosis Date  ? Anaphylactic shock, unspecified, sequela 06/10/2019  ? Aortic insufficiency   ? Aortic stenosis   ?  Arthritis   ? Asthma, chronic, unspecified asthma severity, with acute exacerbation 10/23/2017  ? CAP (community acquired pneumonia) 10/23/2017  ? Carpal tunnel syndrome of right wrist 10/05/2018  ? [place]

## 2021-11-15 NOTE — ED Triage Notes (Signed)
BIB [facility] from home. Dialysis yesterday. CP starting after getting home, weakness in both arms. More SOB than usual this morning, on 2 LPM via [place] at baseline. Emesis once or twice for past few days. Poor appetite.  ? ?EMS ?Gave nitro x1 - some relief ?ASA given ? ?

## 2021-11-15 NOTE — ED Notes (Signed)
Patient transported to CT 

## 2021-11-15 NOTE — Progress Notes (Signed)
Pt wants the  Bipap mask off again, he claims that its making his head hurts. Tylenol suppository given. But still he wants the mask off. Placed on [place] for 15 mins and he complains SOB. Back to Bipap.  ? ?He keeps on going back  and forth on Bipap mask and  despite of explaining the need to be steady  on BIPap. ? ? ?

## 2021-11-15 NOTE — H&P (Signed)
?History and Physical  ? ? ?Thomas Robinson RAQ:[id number] DOB: 1961/06/02 DOA: 11/15/2021 ? ?PCP: [name], PA  ?Patient coming from: Home via EMS ? ?I have personally briefly reviewed patient's old medical records in [place] ? ?Chief Complaint: Chest discomfort, dyspnea ? ?HPI: ?Thomas Robinson is a 61 y.o. male with medical history significant for ESRD on MWF HD, HFpEF (EF 60 to 65% by TTE 08/12/2021)chronic respiratory failure with hypoxia on 2-3 L O2 via [place], chronic pleural effusions, paroxysmal atrial fibrillation on Eliquis, anemia of chronic kidney disease, HTN, HLD, depression/anxiety who presented to the ED for evaluation of chest discomfort, dyspnea. ? ?Patient reports developing nonspecific chest pressure night before admission.  He took nitroglycerin with improvement.  He went to usual dialysis session 3/17 and had developed recurrent chest pressure.  He took several doses of nitroglycerin without significant improvement.  He has been having worsening dyspnea.  He says he has had to increase his home O2 from 3 L to 5 L.  He has significant orthopnea and DOE.  He states he still does make small amount of urine.  He has not had any lower extremity edema.  He came to the ED for further evaluation and management. ? ?ED Course  Labs/Imaging on admission: I have personally reviewed following labs and imaging studies. ? ?Initial vitals showed BP 181/100, pulse 78, RR 32, temp 98.1 ?F, SPO2 98% on 5 L O2 via [place]. ? ?Labs show sodium 138, potassium 5.8, bicarb 24, BUN 45, creatinine 6.65, serum glucose 79, WBC 3.6, hemoglobin 10.0, platelets 136,000, BMP 2899.8, troponin 50 > 55. ? ?2 view chest x-ray showed enlarged cardiac silhouette with pulmonary vascular congestion, pulmonary edema, bibasilar effusions (right greater than left) with atelectasis. ? ?CT chest with contrast shows increasing large right pleural effusion partially loculated at the right lung base.  Near complete right lower lobe  compressive atelectasis seen.  Groundglass opacity throughout both lungs consistent with pulmonary edema, left pleural effusion with adjacent compressive atelectasis, stable aneurysmal dilatation of the ascending aorta at 4.3 cm seen.  Healing left lateral sixth-eighth ribs seen. ? ?Patient was given IV calcium gluconate and oral Lokelma.  Patient was given D50, 5 units insulin ordered but not yet given. ? ?Nephrology were consulted and plan for hemodialysis 3/18.  The hospitalist service was consulted to admit for further evaluation and management. ? ?Review of Systems: All systems reviewed and are negative except as documented in history of present illness above. ? ? ?Past Medical History:  ?Diagnosis Date  ? Anaphylactic shock, unspecified, sequela 06/10/2019  ? Aortic insufficiency   ? Aortic stenosis   ? Arthritis   ? Asthma, chronic, unspecified asthma severity, with acute exacerbation 10/23/2017  ? CAP (community acquired pneumonia) 10/23/2017  ? Carpal tunnel syndrome of right wrist 10/05/2018  ? Cataract   ? right - removed by surgery  ? Cerebral infarction due to thrombosis of cerebral artery ([place])   ? Cervical disc herniation 01/04/2020  ? Chronic diastolic heart failure ([place]) 04/13/2018  ? Chronic low back pain 11/24/2019  ? Constipation   ? COPD (chronic obstructive pulmonary disease) ([place])   ? Cough   ? chronic cough  ? Encounter for immunization 07/08/2017  ? ESRD on hemodialysis ([place]) 02/16/2018  ? Fall 06/04/2019  ? GERD (gastroesophageal reflux disease) 06/04/2019  ? GI bleeding 05/24/2017  ? Gram-negative sepsis, unspecified ([place]) 09/05/2017  ? History of fusion of cervical spine 03/26/2020  ? Hyperlipidemia   ? Hypertension   ?  Hypokalemia 06/04/2017  ? Hypotension   ? Iron deficiency anemia, unspecified 09/09/2017  ? Left shoulder pain 11/11/2019  ? Lumbar radiculopathy 11/24/2019  ? Lung contusion 06/04/2019  ? LVH (left ventricular hypertrophy) due to hypertensive disease, with heart failure  ([place]) 06/18/2020  ? Macrocytic anemia 05/24/2017  ? Mitral regurgitation   ? Moderate protein-calorie malnutrition ([place]) 06/06/2017  ? Myofascial pain syndrome 06/12/2020  ? Neuritis of right ulnar nerve 09/13/2018  ? Non-compliance with renal dialysis ([place]) 02/08/2020  ? Oxygen deficiency 12/30/2019  ? O2 sats on RA 87% at PAT appt   ? Pain in joint of right elbow 09/13/2018  ? Paroxysmal atrial flutter ([place])   ? PEA (Pulseless electrical activity) ([place])   ? after sedation in 07/2021  ? Pulmonary hypertension ([place])   ? Renovascular hypertension 06/22/2020  ? Restless leg syndrome   ? Rib fractures 06/2019  ? Right  ? S/P cardiac cath 08/27/2020  ? normal coronary arteries.  ? Secondary hyperparathyroidism of renal origin ([place]) 06/04/2017  ? Thoracic ascending aortic aneurysm   ? Tricuspid regurgitation   ? Ulnar neuropathy 10/05/2018  ? Volume overload 10/23/2017  ? ? ?Past Surgical History:  ?Procedure Laterality Date  ? A/V FISTULAGRAM N/A 01/01/2021  ? Procedure: A/V FISTULAGRAM;  Surgeon: Serafina Mitchell, MD;  Location: [facility];  Service: Cardiovascular;  Laterality: N/A;  ? A/V FISTULAGRAM N/A 01/15/2021  ? Procedure: A/V FISTULAGRAM;  Surgeon: Serafina Mitchell, MD;  Location: [facility];  Service: Cardiovascular;  Laterality: N/A;  ? ANTERIOR CERVICAL DECOMP/DISCECTOMY FUSION N/A 01/04/2020  ? Procedure: ANTERIOR CERVICAL DECOMPRESSION/DISCECTOMY FUSION CERVICAL FIVE THROUGH SEVEN;  Surgeon: Melina Schools, MD;  Location: [facility];  Service: Orthopedics;  Laterality: N/A;  3 hrs  ? AV FISTULA PLACEMENT Left 05/28/2017  ? Procedure: LEFT ARM ARTERIOVENOUS (AV) FISTULA CREATION;  Surgeon: Conrad Cumberland, MD;  Location: [facility];  Service: Vascular;  Laterality: Left;  ? AV FISTULA PLACEMENT Left 02/02/2021  ? Procedure: Debride left forearm, ligation of left upper arm Aretiovenous fistula;  Surgeon: Elam Dutch, MD;  Location: [facility] OR;  Service: Vascular;  Laterality: Left;  ? AV FISTULA PLACEMENT  Right 04/04/2021  ? Procedure: RIGHT ARTERIOVENOUS (AV) FISTULA CREATION;  Surgeon: Serafina Mitchell, MD;  Location: [facility];  Service: Vascular;  Laterality: Right;  ? BIOPSY  07/10/2021  ? Procedure: BIOPSY;  Surgeon: Lavena Bullion, DO;  Location: [facility];  Service: Gastroenterology;;  ? COLONOSCOPY WITH PROPOFOL N/A 07/10/2021  ? Procedure: COLONOSCOPY WITH PROPOFOL;  Surgeon: Lavena Bullion, DO;  Location: [facility];  Service: Gastroenterology;  Laterality: N/A;  ? ESOPHAGOGASTRODUODENOSCOPY (EGD) WITH PROPOFOL N/A 07/10/2021  ? Procedure: ESOPHAGOGASTRODUODENOSCOPY (EGD) WITH PROPOFOL;  Surgeon: Lavena Bullion, DO;  Location: [facility];  Service: Gastroenterology;  Laterality: N/A;  ? EYE SURGERY Right 06/02/2019  ? Cataract removed  ? FRACTURE SURGERY    ? INSERTION OF DIALYSIS CATHETER Right 02/02/2021  ? Procedure: INSERTION OF Right Internal jugular TUNNELED  DIALYSIS CATHETER;  Surgeon: Elam Dutch, MD;  Location: [facility] OR;  Service: Vascular;  Laterality: Right;  ? IR DIALY SHUNT INTRO NEEDLE/INTRACATH INITIAL W/IMG LEFT Left 09/12/2020  ? IR FLUORO GUIDE CV LINE RIGHT  05/25/2017  ? IR FLUORO GUIDE CV LINE RIGHT  07/17/2021  ? IR THORACENTESIS ASP PLEURAL SPACE W/IMG GUIDE  09/11/2021  ? IR US GUIDE VASC ACCESS LEFT  09/12/2020  ? IR US GUIDE VASC ACCESS RIGHT  05/25/2017  ?  IR US GUIDE VASC ACCESS RIGHT  07/17/2021  ? LIGATION OF ARTERIOVENOUS  FISTULA Left 01/10/2021  ? Procedure: LIGATION OF LEFT ARM RADIOCEPHALIC FISTULA;  Surgeon: Serafina Mitchell, MD;  Location: [facility];  Service: Vascular;  Laterality: Left;  ? PERIPHERAL VASCULAR BALLOON ANGIOPLASTY Left 01/15/2021  ? Procedure: PERIPHERAL VASCULAR BALLOON ANGIOPLASTY;  Surgeon: Serafina Mitchell, MD;  Location: [name];  Service: Cardiovascular;  Laterality: Left;  AVF  ? REVISON OF ARTERIOVENOUS FISTULA Left 07/18/2019  ? Procedure: REVISION PLICATION OF RADIOCEPHALIC ARTERIOVENOUS FISTULA LEFT ARM;  Surgeon: Angelia Mould, MD;  Location: [facility] OR;  Service: Vascular;  Laterality: Left;  ? REVISON OF ARTERIOVENOUS FISTULA Left 01/10/2021  ? Procedure: CONVERSION TO BRACHIOCEPHALIC ARTERIOVENOUS FISTULA;  Surgeon: Trula Slade,

## 2021-11-15 NOTE — Progress Notes (Signed)
Pt just admitted on the floor, on assessment he  complains of chest pain on the left side and pounding heart beat . Pt oxygen saturations ranging between  80s at 7L O2 [place].  Patient switched to NRB. Pt is extremely  tachypnea  and  struggling to breath. Administered  Nitro SL and obtained an EKG. Spoke to DR Posey Pronto, BIPAP was ordered,he inquired about the ordered meds not given while he was in ED. Overdue meds were administered and Rt called to bedside.  ?

## 2021-11-16 ENCOUNTER — Inpatient Hospital Stay (HOSPITAL_COMMUNITY): Payer: Medicare Other

## 2021-11-16 DIAGNOSIS — J9621 Acute and chronic respiratory failure with hypoxia: Secondary | ICD-10-CM | POA: Diagnosis not present

## 2021-11-16 LAB — MRSA NEXT GEN BY PCR, NASAL: MRSA by PCR Next Gen: NOT DETECTED

## 2021-11-16 LAB — GLUCOSE, CAPILLARY
Glucose-Capillary: 31 mg/dL — CL (ref 70–99)
Glucose-Capillary: 79 mg/dL (ref 70–99)
Glucose-Capillary: 51 mg/dL — ABNORMAL LOW (ref 70–99)
Glucose-Capillary: 85 mg/dL (ref 70–99)

## 2021-11-16 LAB — HEPATITIS B SURFACE ANTIBODY, QUANTITATIVE: Hep B S AB Quant (Post): <3.1 m[IU]/mL — ABNORMAL LOW (ref >9.9)

## 2021-11-16 MED ORDER — GLUCOSE 40 % PO GEL
ORAL | Status: AC
  Administered 2021-11-16: 1
  Filled 2021-11-16: qty 1

## 2021-11-16 MED ORDER — GUAIFENESIN-DM 100-10 MG/5ML PO SYRP
5.0000 mL | ORAL_SOLUTION | ORAL | Status: DC | PRN
  Administered 2021-11-16 – 2021-11-19 (×6): 5 mL via ORAL
  Filled 2021-11-16 (×6): qty 5

## 2021-11-16 MED ORDER — LIDOCAINE HCL (PF) 1 % IJ SOLN
INTRAMUSCULAR | Status: AC
  Filled 2021-11-16: qty 30

## 2021-11-16 MED ORDER — DEXTROSE 50 % IV SOLN
INTRAVENOUS | Status: AC
Start: 2021-11-16 — End: 2021-11-16
  Administered 2021-11-16: 20 mL
  Filled 2021-11-16: qty 50

## 2021-11-16 MED ORDER — DEXTROSE 50 % IV SOLN
INTRAVENOUS | Status: AC
Start: 2021-11-16 — End: 2021-11-16
  Filled 2021-11-16: qty 50

## 2021-11-16 MED ORDER — DEXTROSE 50 % IV SOLN
25.0000 mL | Freq: Once | INTRAVENOUS | Status: AC
  Administered 2021-11-16: 25 mL via INTRAVENOUS

## 2021-11-16 NOTE — Progress Notes (Signed)
PT in HD UNit ?

## 2021-11-16 NOTE — Hospital Course (Addendum)
Thomas Robinson was admitted to the hospital with the working diagnosis of volume overload in the setting of ESRD on hemodialysis.  ? ?Thomas Robinson is a 61 y.o. male with medical history significant for ESRD on MWF HD, HFpEF (EF 60 to 65% by TTE 08/12/2021)chronic respiratory failure with hypoxia on 5 to 6  L O2 via [place], chronic pleural effusions, paroxysmal atrial fibrillation on Eliquis, anemia of chronic kidney disease, HTN, HLD, depression/anxiety who is admitted with acute on chronic hypoxic respiratory failure secondary to HFpEF and recurrent large right pleural effusion. ? ?At home patient had chest pressure and dyspnea. Had to increase with supplemental flow of 02, positive orthopnea and dyspnea on exertion. On his initial physical examination his blood pressure 181/100, RR 32, HR 78, temp 98.1 and 02 saturation 98% on supplemental 02. Lungs with decreased breath sounds with increased work of breathing, heart with S1 and S2 present and rhythmic, no gallops, abdomen soft and no lower extremity edema.  ? ?Na 138, K 5,3, CL 100, bicarbonate 24. Glucose 77, bun 40 and cr 5.72 ?BNP 2,899 ?High sensitive troponin 50 and 55  ?Wbc 3,6, hgb 10,0 hct 32.2 plt 136  ?Sars covid 19 negative  ? ?Chest radiograph with cardiomegaly with bilateral interstitial infiltrates more at the right lower lobe, positive loculated right pleural effusion.  ?Ct chest with right sided loculated pleural effusion, near complete right lower lobe compression atelectasis. Bilateral ground glass opacities. Stable aneurysmal dilatation of ascending aorta 4.3 cm.  ? ?EKG 93 bpm, right axis deviation, with normal intervals, sinus rhythm with LVH, no significant ST segment and T wave changes.  ? ?Patient was placed on on invasive mechanical ventilation and underwent hemodialysis with ultrafiltration. ?Right sided thoracentesis with 800 ml fluid removed.  ? ?He was transitioned to nasal cannula but continue to have high oxygen requirements.  ? ?03/22  declining inpatient hemodialysis and willing to leave the hospital against medical advice.  ? ?He agreed to have renal renal replacement therapy. He had HD on 3 consecutive days with improvement in volume status. ?Decreased 02 requirements to 5 L.  ?Plan to follow up as outpatient.  ? ?

## 2021-11-16 NOTE — Procedures (Signed)
PROCEDURE SUMMARY: ? ?Successful US guided therapeutic right thoracentesis. ?Yielded 800 cc of cloudy, rust-colored fluid. ?Pt tolerated procedure well. ?No immediate complications. ? ?Specimen not sent for labs. ?CXR ordered. ? ?EBL < 1 mL ? ?Tyson Alias, AGNP ?11/16/2021 ?10:57 AM ? ?  ? ?

## 2021-11-16 NOTE — Social Work (Signed)
CSW acknowledges consult for SNF/HH. The patient will require PT/OT evaluations. TOC will assist with disposition planning once the evaluations have been completed.  °  °TOC will continue to follow.    °

## 2021-11-16 NOTE — Progress Notes (Signed)
Pt refuse lab draw this time.Marland KitchenPhlebotomist will try to come back this AM. ?

## 2021-11-16 NOTE — Procedures (Signed)
Pt refusal to remain of hemodialysis following multiple discussions that in order for him to feel any better he absolutely must commit to his prescribed hemodialysis time. ?

## 2021-11-16 NOTE — Progress Notes (Signed)
Pt BS was 51 but still on Bipap. I was only able to administer 20 ml of D50 due pt complaining of pain to piv. Attempted to give oral gel however pt oxygen sats dropped to 60s so mask was reapplied. ? Re checked CBG 85.On coming nurse informed. ?

## 2021-11-16 NOTE — Assessment & Plan Note (Signed)
In setting of ESRD with nonadherence.  Given insulin and Lokelma.  Further management with dialysis per nephrology. ?

## 2021-11-16 NOTE — Assessment & Plan Note (Addendum)
Patient remained rate controlled with diltiazem Continue anticoagulation with apixaban with good toleration.   ?

## 2021-11-16 NOTE — Assessment & Plan Note (Signed)
Stable, continue to monitor  ?

## 2021-11-16 NOTE — Progress Notes (Signed)
CBG 31. Pt alert x4. Asymptomatic.  D50 given, recheck CBG 79. Will monitor. ?

## 2021-11-16 NOTE — Progress Notes (Signed)
?PROGRESS NOTE ? ? ? ?Thomas Robinson  AJO:[id number] DOB: 28-Apr-1961 DOA: 11/15/2021 ?PCP: [name], PA  ?Narrative 60/M with history of ESRD on HD MWF, chronic diastolic CHF, chronic respiratory failure on 2 to 3 L O2, chronic pleural effusions, paroxysmal A-fib on Eliquis, anemia of chronic disease, hypertension, depression/anxiety presented to the ED with chest discomfort and dyspnea. ?, Long history of signing off early at hemodialysis per nephrology only stays for 1-1/2 out of ?3-1/2-hour HD treatment ? ? ?Subjective: ?Short of breath, on BiPAP, awaiting hemodialysis and thoracentesis ? ?Assessment & Plan: ? ?Acute on chronic respiratory failure with hypoxia (HCC) ?Acute on chronic bilateral pleural effusions, larger on right ?Acute on chronic HFpEF ?-Admitted with severe dyspnea, orthopnea, increased O2 requirements, imaging noted increasing large right pleural effusion and small left effusion, pulmonary edema  ?-Remains on BiPAP today  ?-Thoracentesis this morning  ?-Followed by dialysis, hoping for longer HD session  ?-Nephrology following ? ?Hyperkalemia ?In setting of ESRD with nonadherence.   ?-Given insulin and Lokelma last night, HD today ? ?ESRD on dialysis on HD MWF-but noncompliant ?Nephrology following with plans for HD today ? ?Paroxysmal atrial fibrillation (HCC) ?Sinus rhythm on admission.  Continue diltiazem and Eliquis. ? ?Anemia in ESRD (end-stage renal disease) ([place]) ?Stable, continue to monitor. ? ?Moderate protein calorie malnutrition ? ?DVT prophylaxis: Apixaban ?Code Status: Full code ?Family Communication: Discussed patient in detail, no family at bedside ?Disposition Plan: Home pending improvement in volume status likely 2 to 3 days ? ?Consultants:  ?Nephrology ?Procedures:  ? ?Antimicrobials:  ? ? ?Objective: ?Vitals:  ? 11/16/21 0624 11/16/21 0700 11/16/21 0802 11/16/21 0842  ?BP: (!) 156/74 (!) 174/61 (!) 146/60   ?Pulse: (!) 58 (!) 56 63 76  ?Resp: '20 14 17 '$ (!) 22  ?Temp:    (!) 97.3 ?F (36.3 ?C)   ?TempSrc:      ?SpO2: 97% 98% 98% 90%  ?Weight:      ?Height:      ? ? ?Intake/Output Summary (Last 24 hours) at 11/16/2021 1023 ?Last data filed at 11/16/2021 9470 ?Gross per 24 hour  ?Intake 460.14 ml  ?Output --  ?Net 460.14 ml  ? ?Filed Weights  ? 11/15/21 1604 11/15/21 2105  ?Weight: 60 kg 50.3 kg  ? ? ?Examination: ? ?General exam: Chronically ill thinly built male sitting up in bed, AAO x2,, on BiPAP ?HEENT: Positive JVD ?CVS: S1-S2, regular rhythm ?Lungs: Decreased breath sounds on the right ?Abdomen: Soft, nontender, bowel sounds present ?Extremities: No edema  ?Skin: No rashes ?Psychiatry:  Mood & affect appropriate.  ? ? ? ?Data Reviewed:  ? ?CBC: ?Recent Labs  ?Lab 11/15/21 ?1642  ?WBC 3.6*  ?NEUTROABS 2.6  ?HGB 10.0*  ?HCT 32.2*  ?MCV 102.2*  ?PLT 136*  ? ?Basic Metabolic Panel: ?Recent Labs  ?Lab 11/15/21 ?1642 11/15/21 ?1829  ?NA 138  --   ?K 5.8*  --   ?CL 97*  --   ?CO2 24  --   ?GLUCOSE 79  --   ?BUN 45*  --   ?CREATININE 6.65*  --   ?CALCIUM 9.9  --   ?PHOS  --  7.4*  ? ?GFR: ?Estimated Creatinine Clearance: 8.2 mL/min (A) (by C-G formula based on SCr of 6.65 mg/dL (H)). ?Liver Function Tests: ?Recent Labs  ?Lab 11/15/21 ?1642  ?AST 20  ?ALT 10  ?ALKPHOS 101  ?BILITOT 1.5*  ?PROT 7.9  ?ALBUMIN 3.7  ? ?No results for input(s): LIPASE, AMYLASE in the last 168 hours. ?No  results for input(s): AMMONIA in the last 168 hours. ?Coagulation Profile: ?No results for input(s): INR, PROTIME in the last 168 hours. ?Cardiac Enzymes: ?No results for input(s): CKTOTAL, CKMB, CKMBINDEX, TROPONINI in the last 168 hours. ?BNP (last 3 results) ?No results for input(s): PROBNP in the last 8760 hours. ?HbA1C: ?No results for input(s): HGBA1C in the last 72 hours. ?CBG: ?Recent Labs  ?Lab 11/15/21 ?2007 11/16/21 ?0148 11/16/21 ?0221 11/16/21 ?0630 11/16/21 ?0700  ?GLUCAP 166* 31* 79 51* 85  ? ?Lipid Profile: ?No results for input(s): CHOL, HDL, LDLCALC, TRIG, CHOLHDL, LDLDIRECT in the last 72  hours. ?Thyroid Function Tests: ?No results for input(s): TSH, T4TOTAL, FREET4, T3FREE, THYROIDAB in the last 72 hours. ?Anemia Panel: ?No results for input(s): VITAMINB12, FOLATE, FERRITIN, TIBC, IRON, RETICCTPCT in the last 72 hours. ?Urine analysis: ?   ?Component Value Date/Time  ? [place] YELLOW 05/24/2017 1729  ? APPEARANCEUR HAZY (A) 05/24/2017 1729  ? LABSPEC 1.011 05/24/2017 1729  ? PHURINE 5.0 05/24/2017 1729  ? GLUCOSEU 50 (A) 05/24/2017 1729  ? HGBUR SMALL (A) 05/24/2017 1729  ? [place] NEGATIVE 05/24/2017 1729  ? [place] NEGATIVE 05/24/2017 1729  ? PROTEINUR 100 (A) 05/24/2017 1729  ? UROBILINOGEN 0.2 04/09/2015 1527  ? NITRITE NEGATIVE 05/24/2017 1729  ? LEUKOCYTESUR TRACE (A) 05/24/2017 1729  ? ?Sepsis Labs: ?'@LABRCNTIP'$ (procalcitonin:4,lacticidven:4) ? ?)No results found for this or any previous visit (from the past 240 hour(s)).  ? ?Radiology Studies: ?DG Chest 2 View ? ?Result Date: 11/15/2021 ?CLINICAL DATA:  Dyspnea, chest pain, weakness, onset of symptoms following dialysis yesterday, increased shortness of breath this morning, vomiting EXAM: CHEST - 2 VIEW COMPARISON:  10/17/2021 FINDINGS: RIGHT jugular catheter with tip projecting over SVC. Enlargement of cardiac silhouette with pulmonary vascular congestion. Diffuse BILATERAL pulmonary infiltrates question pulmonary edema though in multifocal infection not completely excluded. Persistent BILATERAL pleural effusions and basilar atelectasis greater on RIGHT. Atherosclerotic calcification aorta. No pneumothorax. Prior cervical spine fusion IMPRESSION: Enlargement of cardiac silhouette with pulmonary vascular congestion and probable pulmonary edema though multifocal infection not completely excluded. Bibasilar effusions and atelectasis RIGHT greater than LEFT. Electronically Signed   By: Lavonia Dana M.D.   On: 11/15/2021 16:56  ? ?CT Chest W Contrast ? ?Result Date: 11/15/2021 ?CLINICAL DATA:  Pneumonia, complication suspected, xray done  Chest pain.  Shortness of breath. EXAM: CT CHEST WITH CONTRAST TECHNIQUE: Multidetector CT imaging of the chest was performed during intravenous contrast administration. RADIATION DOSE REDUCTION: This exam was performed according to the departmental dose-optimization program which includes automated exposure control, adjustment of the mA and/or kV according to patient size and/or use of iterative reconstruction technique. CONTRAST:  152m OMNIPAQUE IOHEXOL 300 MG/ML  SOLN COMPARISON:  Radiograph earlier today.  Chest CT 10/14/2021 FINDINGS: Cardiovascular: Aortic atherosclerosis. Stable aneurysmal dilatation of the ascending aorta at 4.3 cm. Multi chamber cardiomegaly. There mitral annulus and coronary artery calcifications. Exam not tailored for pulmonary artery evaluation, allowing for this no central pulmonary embolus. Right internal jugular dialysis catheter tip in the SVC. No pericardial effusion. Mediastinum/Nodes: Similar appearance of shotty mediastinal adenopathy, including a 14 mm subcarinal node and 10 mm lower paratracheal node. No progressive adenopathy. No definite enlarged hilar nodes. No thyroid nodule. Decompressed esophagus. Lungs/Pleura: Large right pleural effusion which is partially loculated coursing anteriorly at the right lung base. Near complete right lower lobe compressive atelectasis. There is dependent compressive atelectasis in the right upper lobe. Diffuse progressive ground-glass opacity throughout both lungs with areas of septal thickening. The trachea and central  most bronchi are patent. Left pleural effusion with adjacent compressive atelectasis. Upper Abdomen: Trace right upper scratch in trace upper abdominal ascites. Atrophic left kidney consistent with chronic renal disease. Musculoskeletal: Mild generalized ground-glass density of the osseous structures consistent with renal osteodystrophy. Some interval healing of left lateral sixth, seventh, and eighth ribs from prior exam  with decreased conspicuity of the fracture line. No new osseous abnormalities. Paucity of subcutaneous fat, chest wall soft tissues are otherwise unremarkable. IMPRESSION: 1. Large right pleural effusion which

## 2021-11-16 NOTE — Assessment & Plan Note (Addendum)
Hyperkalemia, hyperphosphatemia.  ? ?Patient had 3 consecutive days of hemodialysis, with improvement of his hypervolemia. ?  ?For metabolic bone disease patient will continue with phosphate binders for hyperphosphatemia.  ?Continue EPO for anemia of chronic disease.  ?His discharge hgb is 7.9 with hct at 24.6  ? ?

## 2021-11-16 NOTE — Progress Notes (Signed)
Pt in HD unit.

## 2021-11-16 NOTE — Discharge Instructions (Signed)

## 2021-11-16 NOTE — Progress Notes (Signed)
?[facility] ?Progress Note  ? ?Subjective:    ?Seen and examined patient at bedside. Currently on Bipap 40% 16/8. C/o being cold. Breathing mildly labored. Denies CP. Plan for HD today-plan to complete at bedside. ? ?Objective ?Vitals:  ? 11/16/21 0624 11/16/21 0700 11/16/21 0802 11/16/21 0842  ?BP: (!) 156/74 (!) 174/61 (!) 146/60   ?Pulse: (!) 58 (!) 56 63 76  ?Resp: '20 14 17 '$ (!) 22  ?Temp:   (!) 97.3 ?F (36.3 ?C)   ?TempSrc:      ?SpO2: 97% 98% 98% 90%  ?Weight:      ?Height:      ? ?Physical Exam ?General: Chronically-ill appearing; awake and alert ?Heart: S1 and S2 ?Lungs: Clear anteriorly; diminished laterally ?Abdomen: soft and non-tender ?Extremities: No edema BLLE ?Dialysis Access: RIJ TDC; R BBF (+) thrill  ? ?Filed Weights  ? 11/15/21 1604 11/15/21 2105  ?Weight: 60 kg 50.3 kg  ? ? ?Intake/Output Summary (Last 24 hours) at 11/16/2021 1020 ?Last data filed at 11/16/2021 1975 ?Gross per 24 hour  ?Intake 460.14 ml  ?Output --  ?Net 460.14 ml  ? ? ?Additional Objective ?Labs: ?Basic Metabolic Panel: ?Recent Labs  ?Lab 11/15/21 ?1642 11/15/21 ?1829  ?NA 138  --   ?K 5.8*  --   ?CL 97*  --   ?CO2 24  --   ?GLUCOSE 79  --   ?BUN 45*  --   ?CREATININE 6.65*  --   ?CALCIUM 9.9  --   ?PHOS  --  7.4*  ? ?Liver Function Tests: ?Recent Labs  ?Lab 11/15/21 ?1642  ?AST 20  ?ALT 10  ?ALKPHOS 101  ?BILITOT 1.5*  ?PROT 7.9  ?ALBUMIN 3.7  ? ?No results for input(s): LIPASE, AMYLASE in the last 168 hours. ?CBC: ?Recent Labs  ?Lab 11/15/21 ?1642  ?WBC 3.6*  ?NEUTROABS 2.6  ?HGB 10.0*  ?HCT 32.2*  ?MCV 102.2*  ?PLT 136*  ? ?Blood Culture ?   ?Component Value Date/Time  ? SDES BLOOD BLOOD LEFT FOREARM 09/19/2021 1443  ? SPECREQUEST  09/19/2021 1443  ?  BOTTLES DRAWN AEROBIC AND ANAEROBIC Blood Culture adequate volume  ? CULT  09/19/2021 1443  ?  NO GROWTH 5 DAYS ?Performed at [facility], [place] [address] [place], [place] [address] ?  ? REPTSTATUS 09/24/2021 FINAL 09/19/2021 1443  ? ? ?Cardiac Enzymes: ?No  results for input(s): CKTOTAL, CKMB, CKMBINDEX, TROPONINI in the last 168 hours. ?CBG: ?Recent Labs  ?Lab 11/15/21 ?2007 11/16/21 ?0148 11/16/21 ?0221 11/16/21 ?0630 11/16/21 ?0700  ?GLUCAP 166* 31* 79 51* 85  ? ?Iron Studies: No results for input(s): IRON, TIBC, TRANSFERRIN, FERRITIN in the last 72 hours. ?Lab Results  ?Component Value Date  ? INR 1.3 (H) 09/20/2021  ? INR 1.2 08/12/2021  ? INR 1.4 (H) 07/17/2021  ? ?Studies/Results: ?DG Chest 2 View ? ?Result Date: 11/15/2021 ?CLINICAL DATA:  Dyspnea, chest pain, weakness, onset of symptoms following dialysis yesterday, increased shortness of breath this morning, vomiting EXAM: CHEST - 2 VIEW COMPARISON:  10/17/2021 FINDINGS: RIGHT jugular catheter with tip projecting over SVC. Enlargement of cardiac silhouette with pulmonary vascular congestion. Diffuse BILATERAL pulmonary infiltrates question pulmonary edema though in multifocal infection not completely excluded. Persistent BILATERAL pleural effusions and basilar atelectasis greater on RIGHT. Atherosclerotic calcification aorta. No pneumothorax. Prior cervical spine fusion IMPRESSION: Enlargement of cardiac silhouette with pulmonary vascular congestion and probable pulmonary edema though multifocal infection not completely excluded. Bibasilar effusions and atelectasis RIGHT greater than LEFT. Electronically Signed  By: Lavonia Dana M.D.   On: 11/15/2021 16:56  ? ?CT Chest W Contrast ? ?Result Date: 11/15/2021 ?CLINICAL DATA:  Pneumonia, complication suspected, xray done Chest pain.  Shortness of breath. EXAM: CT CHEST WITH CONTRAST TECHNIQUE: Multidetector CT imaging of the chest was performed during intravenous contrast administration. RADIATION DOSE REDUCTION: This exam was performed according to the departmental dose-optimization program which includes automated exposure control, adjustment of the mA and/or kV according to patient size and/or use of iterative reconstruction technique. CONTRAST:  16m OMNIPAQUE  IOHEXOL 300 MG/ML  SOLN COMPARISON:  Radiograph earlier today.  Chest CT 10/14/2021 FINDINGS: Cardiovascular: Aortic atherosclerosis. Stable aneurysmal dilatation of the ascending aorta at 4.3 cm. Multi chamber cardiomegaly. There mitral annulus and coronary artery calcifications. Exam not tailored for pulmonary artery evaluation, allowing for this no central pulmonary embolus. Right internal jugular dialysis catheter tip in the SVC. No pericardial effusion. Mediastinum/Nodes: Similar appearance of shotty mediastinal adenopathy, including a 14 mm subcarinal node and 10 mm lower paratracheal node. No progressive adenopathy. No definite enlarged hilar nodes. No thyroid nodule. Decompressed esophagus. Lungs/Pleura: Large right pleural effusion which is partially loculated coursing anteriorly at the right lung base. Near complete right lower lobe compressive atelectasis. There is dependent compressive atelectasis in the right upper lobe. Diffuse progressive ground-glass opacity throughout both lungs with areas of septal thickening. The trachea and central most bronchi are patent. Left pleural effusion with adjacent compressive atelectasis. Upper Abdomen: Trace right upper scratch in trace upper abdominal ascites. Atrophic left kidney consistent with chronic renal disease. Musculoskeletal: Mild generalized ground-glass density of the osseous structures consistent with renal osteodystrophy. Some interval healing of left lateral sixth, seventh, and eighth ribs from prior exam with decreased conspicuity of the fracture line. No new osseous abnormalities. Paucity of subcutaneous fat, chest wall soft tissues are otherwise unremarkable. IMPRESSION: 1. Large right pleural effusion which is partially loculated coursing anteriorly at the right lung base. Near complete right lower lobe compressive atelectasis. Pleural effusion has increased in size from CT last month. Small left pleural effusion and basilar atelectasis. 2.  Progressive ground-glass opacity throughout both lungs with areas of septal thickening, most consistent with pulmonary edema. Superimposed infection is also considered but felt less likely. 3. Left pleural effusion with adjacent compressive atelectasis. 4. Stable aneurysmal dilatation of the ascending aorta at 4.3 cm. Recommend annual imaging followup by CTA or MRA. This recommendation follows 2010 ACCF/AHA/AATS/ACR/ASA/SCA/SCAI/SIR/STS/SVM Guidelines for the Diagnosis and Management of Patients with Thoracic Aortic Disease. Circulation. 2010; 121:: M841-L244 Aortic aneurysm NOS (ICD10-I71.9) 5. Chronic cardiomegaly. Mitral annulus and coronary artery calcifications. 6. Trace upper abdominal ascites. 7. Healing left lateral sixth, seventh, and eighth ribs from prior exam with decreased conspicuity of the fracture line. Aortic Atherosclerosis (ICD10-I70.0). Electronically Signed   By: MKeith RakeM.D.   On: 11/15/2021 20:16   ? ?Medications: ? sodium chloride    ? sodium chloride    ? ? apixaban  2.5 mg Oral BID  ? Chlorhexidine Gluconate Cloth  6 each Topical Q0600  ? diltiazem  300 mg Oral Daily  ? feeding supplement  237 mL Oral BID BM  ? sodium chloride flush  3 mL Intravenous Q12H  ? ? ?Dialysis Orders: ? ?[facility] MWF, 3.5hrs, F180, 400/800, 2k, 2.5Cal, UF profile 2, EDW 45.5kg. On mircera 1523m q2weeks (last dose: 3/16) ? ?Assessment/Plan: ?1) ESRD:  last HD was on 3/16 but only on for 1-1/2 hours. H/o severe noncompliance. His last treatments have been on March 1, 4,  6, 13, 16 and usually only stays for 1-1/2 hours. ?- Plan for HD today; will try for 3-1/2 hours and see how he tolerates. Well above his EDW. Will need to complete HD at bedside d/t patient being on Bipap. Hopeful patient can be weaned to [place] to bring him to HD unit. Will d/w HD staff. Noted yesterday's K+ 5.8-Lokelma given. Will re-check RFP after HD. ?  ?2) Anemia of Chronic Kidney Disease: Hemoglobin 10. Last ESA given 3/16. ?  ?3) Volume/  hypertension: EDW 45.5kg. Attempt to achieve EDW as tolerated + possible thoracentesis (not sure if there's enough to tap); accumulating bec of poor nutritional status and also [place] with dialysis treatments.

## 2021-11-16 NOTE — Progress Notes (Signed)
Off bipap for breakfast ? 11/16/21 0842  ?Vitals  ?Pulse Rate 76  ?ECG Heart Rate 75  ?Resp (!) 22  ?MEWS COLOR  ?MEWS Score Color Green  ?Oxygen Therapy  ?SpO2 90 %  ?O2 Device Nasal Cannula  ?O2 Flow Rate (L/min) 7 L/min  ?MEWS Score  ?MEWS Temp 0  ?MEWS Systolic 0  ?MEWS Pulse 0  ?MEWS RR 1  ?MEWS LOC 0  ?MEWS Score 1  ? ? ?

## 2021-11-17 ENCOUNTER — Encounter (HOSPITAL_COMMUNITY): Payer: Self-pay | Admitting: Internal Medicine

## 2021-11-17 DIAGNOSIS — J9621 Acute and chronic respiratory failure with hypoxia: Secondary | ICD-10-CM | POA: Diagnosis not present

## 2021-11-17 MED ORDER — HYDROMORPHONE HCL 1 MG/ML IJ SOLN
1.0000 mg | INTRAMUSCULAR | Status: AC | PRN
  Administered 2021-11-17 (×2): 1 mg via INTRAVENOUS
  Filled 2021-11-17 (×2): qty 1

## 2021-11-17 MED ORDER — SODIUM CHLORIDE 0.9 % IV SOLN: 100.0000 mL | INTRAVENOUS | Status: DC | PRN

## 2021-11-17 MED ORDER — HYDROMORPHONE HCL 1 MG/ML IJ SOLN
0.5000 mg | INTRAMUSCULAR | Status: AC | PRN
  Administered 2021-11-18 (×2): 0.5 mg via INTRAVENOUS
  Filled 2021-11-17 (×2): qty 0.5

## 2021-11-17 MED ORDER — PENTAFLUOROPROP-TETRAFLUOROETH EX AERO: 1.0000 "application " | INHALATION_SPRAY | CUTANEOUS | Status: DC | PRN

## 2021-11-17 MED ORDER — NALOXONE HCL 0.4 MG/ML IJ SOLN: 0.4000 mg | INTRAMUSCULAR | Status: DC | PRN

## 2021-11-17 MED ORDER — ALTEPLASE 2 MG IJ SOLR
2.0000 mg | Freq: Once | INTRAMUSCULAR | Status: DC | PRN
  Filled 2021-11-17: qty 2

## 2021-11-17 MED ORDER — HYDROMORPHONE HCL 1 MG/ML IJ SOLN
0.5000 mg | INTRAMUSCULAR | Status: AC | PRN
  Administered 2021-11-17 (×2): 0.5 mg via INTRAVENOUS
  Filled 2021-11-17 (×2): qty 0.5

## 2021-11-17 MED ORDER — LIDOCAINE-PRILOCAINE 2.5-2.5 % EX CREA
1.0000 "application " | TOPICAL_CREAM | CUTANEOUS | Status: DC | PRN
  Filled 2021-11-17: qty 5

## 2021-11-17 MED ORDER — HEPARIN SODIUM (PORCINE) 1000 UNIT/ML DIALYSIS
1000.0000 [IU] | INTRAMUSCULAR | Status: DC | PRN
  Filled 2021-11-17 (×2): qty 1

## 2021-11-17 MED ORDER — ONDANSETRON HCL 4 MG/2ML IJ SOLN
4.0000 mg | Freq: Four times a day (QID) | INTRAMUSCULAR | Status: DC | PRN
  Administered 2021-11-17 – 2021-11-18 (×2): 4 mg via INTRAVENOUS
  Filled 2021-11-17 (×2): qty 2

## 2021-11-17 MED ORDER — LIDOCAINE HCL (PF) 1 % IJ SOLN: 5.0000 mL | INTRAMUSCULAR | Status: DC | PRN

## 2021-11-17 MED ORDER — HYDROMORPHONE HCL 1 MG/ML IJ SOLN: 1.0000 mg | INTRAMUSCULAR | Status: DC | PRN

## 2021-11-17 NOTE — Progress Notes (Signed)
Patient refused AM vital signs and weight. States he is going home today. Advised that vital signs are needed for discharge and patient still refused. ?

## 2021-11-17 NOTE — Progress Notes (Signed)
Patient refused AM blood draw due to multiple attempts in the past. Patient said that he wants to be discharged from hospital ASAP. ?

## 2021-11-17 NOTE — Care Management (Signed)
Verified w liaison that patient is active w [facility] ?

## 2021-11-17 NOTE — Progress Notes (Signed)
?[facility] ?Progress Note  ? ?Subjective:    ?Seen and examined patient at bedside. Patient signed off HD early yesterday despite discussions between myself and HD nursing staff. Noted net UF 784m. Apparently wanted to leave AMA earlier today but then decided to stay. Plan for HD 3/20 1st shift for 3 hrs. ? ?Objective ?Vitals:  ? 11/16/21 1900 11/16/21 2000 11/16/21 2354 11/17/21 0735  ?BP:  (!) 159/57 (!) 162/52 (!) 150/47  ?Pulse: 81 84 94 78  ?Resp:  '20 18 20  '$ ?Temp:  99.4 ?F (37.4 ?C) 99.4 ?F (37.4 ?C) 99 ?F (37.2 ?C)  ?TempSrc:  Oral Oral Oral  ?SpO2: 93% 97% 94% 97%  ?Weight:      ?Height:      ? ?Physical Exam ?General: Chronically-ill appearing; awake and alert ?Heart: S1 and S2 ?Lungs: Clear anteriorly; diminished laterally ?Abdomen: soft and non-tender ?Extremities: No edema BLLE ?Dialysis Access: RIJ TDC; R BBF (+) thrill  ? ?Filed Weights  ? 11/15/21 1604 11/15/21 2105 11/16/21 1215  ?Weight: 60 kg 50.3 kg 51.7 kg  ? ? ?Intake/Output Summary (Last 24 hours) at 11/17/2021 1047 ?Last data filed at 11/17/2021 0915 ?Gross per 24 hour  ?Intake 840 ml  ?Output 758 ml  ?Net 82 ml  ? ? ?Additional Objective ?Labs: ?Basic Metabolic Panel: ?Recent Labs  ?Lab 11/15/21 ?1642 11/15/21 ?1829  ?NA 138  --   ?K 5.8*  --   ?CL 97*  --   ?CO2 24  --   ?GLUCOSE 79  --   ?BUN 45*  --   ?CREATININE 6.65*  --   ?CALCIUM 9.9  --   ?PHOS  --  7.4*  ? ?Liver Function Tests: ?Recent Labs  ?Lab 11/15/21 ?1642  ?AST 20  ?ALT 10  ?ALKPHOS 101  ?BILITOT 1.5*  ?PROT 7.9  ?ALBUMIN 3.7  ? ?No results for input(s): LIPASE, AMYLASE in the last 168 hours. ?CBC: ?Recent Labs  ?Lab 11/15/21 ?1642  ?WBC 3.6*  ?NEUTROABS 2.6  ?HGB 10.0*  ?HCT 32.2*  ?MCV 102.2*  ?PLT 136*  ? ?Blood Culture ?   ?Component Value Date/Time  ? SDES BLOOD BLOOD LEFT FOREARM 09/19/2021 1443  ? SPECREQUEST  09/19/2021 1443  ?  BOTTLES DRAWN AEROBIC AND ANAEROBIC Blood Culture adequate volume  ? CULT  09/19/2021 1443  ?  NO GROWTH 5 DAYS ?Performed at  [facility] [id number] [place] [address]?  ? REPTSTATUS 09/24/2021 FINAL 09/19/2021 1443  ? ? ?Cardiac Enzymes: ?No results for input(s): CKTOTAL, CKMB, CKMBINDEX, TROPONINI in the last 168 hours. ?CBG: ?Recent Labs  ?Lab 11/15/21 ?2007 11/16/21 ?0148 11/16/21 ?0221 11/16/21 ?0630 11/16/21 ?0700  ?GLUCAP 166* 31* 79 51* 85  ? ?Iron Studies: No results for input(s): IRON, TIBC, TRANSFERRIN, FERRITIN in the last 72 hours. ?Lab Results  ?Component Value Date  ? INR 1.3 (H) 09/20/2021  ? INR 1.2 08/12/2021  ? INR 1.4 (H) 07/17/2021  ? ?Studies/Results: ?DG Chest 1 View ? ?Result Date: 11/16/2021 ?CLINICAL DATA:  Chest pain. EXAM: CHEST  1 VIEW COMPARISON:  11/15/2021 FINDINGS: Right chest wall dialysis catheter noted with tips at the cavoatrial junction. Stable cardiomediastinal contours. There is a moderate volume right pleural effusion which appears unchanged from previous exam. Diffusely increased interstitial markings are identified bilaterally compatible with interstitial edema. No signs of pneumothorax following thoracentesis. IMPRESSION: 1. Persistent moderate volume right pleural effusion. No pneumothorax following thoracentesis. 2. Moderate diffuse pulmonary edema. Electronically Signed   By:  Kerby Moors M.D.   On: 11/16/2021 11:18  ? ?DG Chest 2 View ? ?Result Date: 11/15/2021 ?CLINICAL DATA:  Dyspnea, chest pain, weakness, onset of symptoms following dialysis yesterday, increased shortness of breath this morning, vomiting EXAM: CHEST - 2 VIEW COMPARISON:  10/17/2021 FINDINGS: RIGHT jugular catheter with tip projecting over SVC. Enlargement of cardiac silhouette with pulmonary vascular congestion. Diffuse BILATERAL pulmonary infiltrates question pulmonary edema though in multifocal infection not completely excluded. Persistent BILATERAL pleural effusions and basilar atelectasis greater on RIGHT. Atherosclerotic calcification aorta. No pneumothorax. Prior cervical spine fusion  IMPRESSION: Enlargement of cardiac silhouette with pulmonary vascular congestion and probable pulmonary edema though multifocal infection not completely excluded. Bibasilar effusions and atelectasis RIGHT greater than LEFT. Electronically Signed   By: Lavonia Dana M.D.   On: 11/15/2021 16:56  ? ?CT Chest W Contrast ? ?Result Date: 11/15/2021 ?CLINICAL DATA:  Pneumonia, complication suspected, xray done Chest pain.  Shortness of breath. EXAM: CT CHEST WITH CONTRAST TECHNIQUE: Multidetector CT imaging of the chest was performed during intravenous contrast administration. RADIATION DOSE REDUCTION: This exam was performed according to the departmental dose-optimization program which includes automated exposure control, adjustment of the mA and/or kV according to patient size and/or use of iterative reconstruction technique. CONTRAST:  132m OMNIPAQUE IOHEXOL 300 MG/ML  SOLN COMPARISON:  Radiograph earlier today.  Chest CT 10/14/2021 FINDINGS: Cardiovascular: Aortic atherosclerosis. Stable aneurysmal dilatation of the ascending aorta at 4.3 cm. Multi chamber cardiomegaly. There mitral annulus and coronary artery calcifications. Exam not tailored for pulmonary artery evaluation, allowing for this no central pulmonary embolus. Right internal jugular dialysis catheter tip in the SVC. No pericardial effusion. Mediastinum/Nodes: Similar appearance of shotty mediastinal adenopathy, including a 14 mm subcarinal node and 10 mm lower paratracheal node. No progressive adenopathy. No definite enlarged hilar nodes. No thyroid nodule. Decompressed esophagus. Lungs/Pleura: Large right pleural effusion which is partially loculated coursing anteriorly at the right lung base. Near complete right lower lobe compressive atelectasis. There is dependent compressive atelectasis in the right upper lobe. Diffuse progressive ground-glass opacity throughout both lungs with areas of septal thickening. The trachea and central most bronchi are patent.  Left pleural effusion with adjacent compressive atelectasis. Upper Abdomen: Trace right upper scratch in trace upper abdominal ascites. Atrophic left kidney consistent with chronic renal disease. Musculoskeletal: Mild generalized ground-glass density of the osseous structures consistent with renal osteodystrophy. Some interval healing of left lateral sixth, seventh, and eighth ribs from prior exam with decreased conspicuity of the fracture line. No new osseous abnormalities. Paucity of subcutaneous fat, chest wall soft tissues are otherwise unremarkable. IMPRESSION: 1. Large right pleural effusion which is partially loculated coursing anteriorly at the right lung base. Near complete right lower lobe compressive atelectasis. Pleural effusion has increased in size from CT last month. Small left pleural effusion and basilar atelectasis. 2. Progressive ground-glass opacity throughout both lungs with areas of septal thickening, most consistent with pulmonary edema. Superimposed infection is also considered but felt less likely. 3. Left pleural effusion with adjacent compressive atelectasis. 4. Stable aneurysmal dilatation of the ascending aorta at 4.3 cm. Recommend annual imaging followup by CTA or MRA. This recommendation follows 2010 ACCF/AHA/AATS/ACR/ASA/SCA/SCAI/SIR/STS/SVM Guidelines for the Diagnosis and Management of Patients with Thoracic Aortic Disease. Circulation. 2010; 121:: M841-L244 Aortic aneurysm NOS (ICD10-I71.9) 5. Chronic cardiomegaly. Mitral annulus and coronary artery calcifications. 6. Trace upper abdominal ascites. 7. Healing left lateral sixth, seventh, and eighth ribs from prior exam with decreased conspicuity of the fracture line. Aortic Atherosclerosis (ICD10-I70.0). Electronically  Signed   By: Keith Rake M.D.   On: 11/15/2021 20:16  ? ?US THORACENTESIS ASP PLEURAL SPACE W/IMG GUIDE ? ?Result Date: 11/16/2021 ?INDICATION: History of ESRD on HD, chronic diastolic CHF and chronic respiratory  failure with chronic pleural effusions. Request for right-sided therapeutic thoracentesis. EXAM: ULTRASOUND GUIDED THERAPEUTIC RIGHT THORACENTESIS MEDICATIONS: 10 mL 1 % lidocaine COMPLICATIONS: None immedia

## 2021-11-17 NOTE — Progress Notes (Signed)
?PROGRESS NOTE ? ? ? ?Thomas Robinson  GYJ:[id number] DOB: June 12, 1961 DOA: 11/15/2021 ?PCP: [name], PA  ?Narrative 60/M with history of ESRD on HD MWF, chronic diastolic CHF, chronic respiratory failure on 2 to 3 L O2, chronic pleural effusions, paroxysmal A-fib on Eliquis, anemia of chronic disease, hypertension, depression/anxiety presented to the ED with chest discomfort and dyspnea. ?, Long history of signing off early at hemodialysis per nephrology only stays for 1-1/2 out of ?3-1/2-hour HD treatment ? ? ?Subjective: ?Short of breath, on BiPAP, awaiting hemodialysis and thoracentesis ? ?Assessment & Plan: ? ?Acute on chronic respiratory failure with hypoxia (HCC) ?Acute on chronic bilateral pleural effusions, larger on right ?Acute on chronic diastolic CHF ?-Admitted with severe dyspnea, orthopnea, increased O2 requirements, imaging noted increasing large right pleural effusion and small left effusion, pulmonary edema  ?-Weaned off BiPAP yesterday ?-Underwent thoracentesis 3/18, 800 mL fluid drained, unfortunately thoracentesis labs were not ordered on admission ?-Unable to tolerate long HD session yesterday, signed out after 2 hours, counseled again, add midodrine, plan for repeat HD tomorrow ?-Nephrology following ? ?Hyperkalemia ?In setting of ESRD with nonadherence.   ?-Given insulin and Lokelma on admission, improved ? ?ESRD on dialysis on HD MWF-but noncompliant ?Nephrology following with plans for HD tomorrow ? ?Paroxysmal atrial fibrillation (HCC) ?Sinus rhythm on admission.  Continue diltiazem and Eliquis. ? ?Anemia in ESRD (end-stage renal disease) ([place]) ?Stable, continue to monitor. ? ?Moderate protein calorie malnutrition ?-Supplements ordered ? ?DVT prophylaxis: Apixaban ?Code Status: Full code ?Family Communication: Discussed patient in detail, no family at bedside ?Disposition Plan: Home pending improvement in volume status, hypoxia ? ?Consultants:  ?Nephrology ?Procedures:   ? ?Antimicrobials:  ? ? ?Objective: ?Vitals:  ? 11/16/21 1900 11/16/21 2000 11/16/21 2354 11/17/21 0735  ?BP:  (!) 159/57 (!) 162/52 (!) 150/47  ?Pulse: 81 84 94 78  ?Resp:  '20 18 20  '$ ?Temp:  99.4 ?F (37.4 ?C) 99.4 ?F (37.4 ?C) 99 ?F (37.2 ?C)  ?TempSrc:  Oral Oral Oral  ?SpO2: 93% 97% 94% 97%  ?Weight:      ?Height:      ? ? ?Intake/Output Summary (Last 24 hours) at 11/17/2021 1139 ?Last data filed at 11/17/2021 0915 ?Gross per 24 hour  ?Intake 840 ml  ?Output 758 ml  ?Net 82 ml  ? ?Filed Weights  ? 11/15/21 1604 11/15/21 2105 11/16/21 1215  ?Weight: 60 kg 50.3 kg 51.7 kg  ? ? ?Examination: ? ?General exam: Chronically ill thinly built male sitting up in bed, AAOx3, no distress ?HEENT: Positive JVD ?CVS: S1-S2, regular rate rhythm ?Lungs: Decreased breath sounds, fine basilar rales ?Abdomen: Soft, nontender, bowel sounds present ?Extremities: No edema  ?Skin: No rashes ?Psychiatry:  Mood & affect appropriate.  ? ? ? ?Data Reviewed:  ? ?CBC: ?Recent Labs  ?Lab 11/15/21 ?1642  ?WBC 3.6*  ?NEUTROABS 2.6  ?HGB 10.0*  ?HCT 32.2*  ?MCV 102.2*  ?PLT 136*  ? ?Basic Metabolic Panel: ?Recent Labs  ?Lab 11/15/21 ?1642 11/15/21 ?1829  ?NA 138  --   ?K 5.8*  --   ?CL 97*  --   ?CO2 24  --   ?GLUCOSE 79  --   ?BUN 45*  --   ?CREATININE 6.65*  --   ?CALCIUM 9.9  --   ?PHOS  --  7.4*  ? ?GFR: ?Estimated Creatinine Clearance: 8.4 mL/min (A) (by C-G formula based on SCr of 6.65 mg/dL (H)). ?Liver Function Tests: ?Recent Labs  ?Lab 11/15/21 ?1642  ?AST 20  ?ALT  10  ?ALKPHOS 101  ?BILITOT 1.5*  ?PROT 7.9  ?ALBUMIN 3.7  ? ?No results for input(s): LIPASE, AMYLASE in the last 168 hours. ?No results for input(s): AMMONIA in the last 168 hours. ?Coagulation Profile: ?No results for input(s): INR, PROTIME in the last 168 hours. ?Cardiac Enzymes: ?No results for input(s): CKTOTAL, CKMB, CKMBINDEX, TROPONINI in the last 168 hours. ?BNP (last 3 results) ?No results for input(s): PROBNP in the last 8760 hours. ?HbA1C: ?No results for input(s):  HGBA1C in the last 72 hours. ?CBG: ?Recent Labs  ?Lab 11/15/21 ?2007 11/16/21 ?0148 11/16/21 ?0221 11/16/21 ?0630 11/16/21 ?0700  ?GLUCAP 166* 31* 79 51* 85  ? ?Lipid Profile: ?No results for input(s): CHOL, HDL, LDLCALC, TRIG, CHOLHDL, LDLDIRECT in the last 72 hours. ?Thyroid Function Tests: ?No results for input(s): TSH, T4TOTAL, FREET4, T3FREE, THYROIDAB in the last 72 hours. ?Anemia Panel: ?No results for input(s): VITAMINB12, FOLATE, FERRITIN, TIBC, IRON, RETICCTPCT in the last 72 hours. ?Urine analysis: ?   ?Component Value Date/Time  ? [place] YELLOW 05/24/2017 1729  ? APPEARANCEUR HAZY (A) 05/24/2017 1729  ? LABSPEC 1.011 05/24/2017 1729  ? PHURINE 5.0 05/24/2017 1729  ? GLUCOSEU 50 (A) 05/24/2017 1729  ? HGBUR SMALL (A) 05/24/2017 1729  ? [place] NEGATIVE 05/24/2017 1729  ? [place] NEGATIVE 05/24/2017 1729  ? PROTEINUR 100 (A) 05/24/2017 1729  ? UROBILINOGEN 0.2 04/09/2015 1527  ? NITRITE NEGATIVE 05/24/2017 1729  ? LEUKOCYTESUR TRACE (A) 05/24/2017 1729  ? ?Sepsis Labs: ?'@LABRCNTIP'$ (procalcitonin:4,lacticidven:4) ? ?) ?Recent Results (from the past 240 hour(s))  ?MRSA Next Gen by PCR, Nasal     Status: None  ? Collection Time: 11/16/21  4:47 PM  ? Specimen: Nasal Mucosa; Nasal Swab  ?Result Value Ref Range Status  ? MRSA by PCR Next Gen NOT DETECTED NOT DETECTED Final  ?  Comment: (NOTE) ?The GeneXpert MRSA Assay (FDA approved for NASAL specimens only), ?is one component of a comprehensive MRSA colonization surveillance ?program. It is not intended to diagnose MRSA infection nor to guide ?or monitor treatment for MRSA infections. ?Test performance is not FDA approved in patients less than 2 years ?old. ?Performed at [facility], [place] [address], [place], [place] ?[address] ?  ?  ? ?Radiology Studies: ?DG Chest 1 View ? ?Result Date: 11/16/2021 ?CLINICAL DATA:  Chest pain. EXAM: CHEST  1 VIEW COMPARISON:  11/15/2021 FINDINGS: Right chest wall dialysis catheter noted with tips at the cavoatrial  junction. Stable cardiomediastinal contours. There is a moderate volume right pleural effusion which appears unchanged from previous exam. Diffusely increased interstitial markings are identified bilaterally compatible with interstitial edema. No signs of pneumothorax following thoracentesis. IMPRESSION: 1. Persistent moderate volume right pleural effusion. No pneumothorax following thoracentesis. 2. Moderate diffuse pulmonary edema. Electronically Signed   By: Kerby Moors M.D.   On: 11/16/2021 11:18  ? ?DG Chest 2 View ? ?Result Date: 11/15/2021 ?CLINICAL DATA:  Dyspnea, chest pain, weakness, onset of symptoms following dialysis yesterday, increased shortness of breath this morning, vomiting EXAM: CHEST - 2 VIEW COMPARISON:  10/17/2021 FINDINGS: RIGHT jugular catheter with tip projecting over SVC. Enlargement of cardiac silhouette with pulmonary vascular congestion. Diffuse BILATERAL pulmonary infiltrates question pulmonary edema though in multifocal infection not completely excluded. Persistent BILATERAL pleural effusions and basilar atelectasis greater on RIGHT. Atherosclerotic calcification aorta. No pneumothorax. Prior cervical spine fusion IMPRESSION: Enlargement of cardiac silhouette with pulmonary vascular congestion and probable pulmonary edema though multifocal infection not completely excluded. Bibasilar effusions and atelectasis RIGHT greater than LEFT.  Electronically Signed   By: Lavonia Dana M.D.   On: 11/15/2021 16:56  ? ?CT Chest W Contrast ? ?Result Date: 11/15/2021 ?CLINICAL DATA:  Pneumonia, complication suspected, xray done Chest pain.  Shortness of breath. EXAM: CT CHEST WITH CONTRAST TECHNIQUE: Multidetector CT imaging of the chest was performed during intravenous contrast administration. RADIATION DOSE REDUCTION: This exam was performed according to the departmental dose-optimization program which includes automated exposure control, adjustment of the mA and/or kV according to patient size  and/or use of iterative reconstruction technique. CONTRAST:  181m OMNIPAQUE IOHEXOL 300 MG/ML  SOLN COMPARISON:  Radiograph earlier today.  Chest CT 10/14/2021 FINDINGS: Cardiovascular: Aortic atherosclerosis. Stable

## 2021-11-17 NOTE — Progress Notes (Signed)
Patient back from hemodialysis. He wants to be discharged stating that  "I don`t want dialysis anymore". MD made aware. Patient not ready for discharge per MD. ?

## 2021-11-18 DIAGNOSIS — J9621 Acute and chronic respiratory failure with hypoxia: Secondary | ICD-10-CM | POA: Diagnosis not present

## 2021-11-18 LAB — CBC
HCT: 24.9 % — ABNORMAL LOW (ref 39.0–52.0)
Hemoglobin: 7.8 g/dL — ABNORMAL LOW (ref 13.0–17.0)
MCH: 32.4 pg (ref 26.0–34.0)
MCHC: 31.3 g/dL (ref 30.0–36.0)
MCV: 103.3 fL — ABNORMAL HIGH (ref 80.0–100.0)
Platelets: 165 10*3/uL (ref 150–400)
RBC: 2.41 MIL/uL — ABNORMAL LOW (ref 4.22–5.81)
RDW: 15 % (ref 11.5–15.5)
WBC: 6.3 10*3/uL (ref 4.0–10.5)
nRBC: 0 % (ref 0.0–0.2)

## 2021-11-18 LAB — BASIC METABOLIC PANEL
Anion gap: 14 (ref 5–15)
BUN: 48 mg/dL — ABNORMAL HIGH (ref 8–23)
CO2: 26 mmol/L (ref 22–32)
Calcium: 9.7 mg/dL (ref 8.9–10.3)
Chloride: 97 mmol/L — ABNORMAL LOW (ref 98–111)
Creatinine, Ser: 6.95 mg/dL — ABNORMAL HIGH (ref 0.61–1.24)
GFR, Estimated: 8 mL/min — ABNORMAL LOW (ref >60)
Glucose, Bld: 89 mg/dL (ref 70–99)
Potassium: 6.1 mmol/L — ABNORMAL HIGH (ref 3.5–5.1)
Sodium: 137 mmol/L (ref 135–145)

## 2021-11-18 LAB — RETICULOCYTES
Immature Retic Fract: 25.9 % — ABNORMAL HIGH (ref 2.3–15.9)
RBC.: 2.36 MIL/uL — ABNORMAL LOW (ref 4.22–5.81)
Retic Count, Absolute: 66.8 10*3/uL (ref 19.0–186.0)
Retic Ct Pct: 2.8 % (ref 0.4–3.1)

## 2021-11-18 MED ORDER — ALBUMIN HUMAN 25 % IV SOLN
INTRAVENOUS | Status: AC
  Administered 2021-11-18: 12.5 g
  Filled 2021-11-18: qty 50

## 2021-11-18 MED ORDER — HYDROMORPHONE HCL 1 MG/ML IJ SOLN
0.5000 mg | INTRAMUSCULAR | Status: DC | PRN
  Administered 2021-11-18 – 2021-11-23 (×15): 0.5 mg via INTRAVENOUS
  Filled 2021-11-18 (×16): qty 0.5

## 2021-11-18 NOTE — Care Management Important Message (Signed)
Important Message ? ?Patient Details  ?Name: Thomas Robinson ?MRN: [id number] ?Date of Birth: 01-Jan-1961 ? ? ?Medicare Important Message Given:  Yes ? ? ? ? ?Shelda Altes ?11/18/2021, 9:40 AM ?

## 2021-11-18 NOTE — Progress Notes (Signed)
Pt not in room. BiPAP on stby. ?

## 2021-11-18 NOTE — Progress Notes (Signed)
Patient declined BIPAP use. No distress noted at this time. RT will monitor as needed. ?

## 2021-11-18 NOTE — Progress Notes (Signed)
Heart Failure Navigator Progress Note ? ?Assessed for Heart & Vascular TOC clinic readiness.  ?Patient does not meet criteria due to ESRD, currently in HD.[name]  ? ? ? ?Earnestine Leys, BSN, RN ?Heart Failure Nurse Navigator ?[phone]   ?

## 2021-11-18 NOTE — Progress Notes (Addendum)
?PROGRESS NOTE ? ? ? ?[name]  OIN:[id number] DOB: 10-May-1961 DOA: 11/15/2021 ?PCP: [name], PA  ?Narrative 60/M with history of ESRD on HD MWF, chronic diastolic CHF, chronic respiratory failure on 2 to 3 L O2, chronic pleural effusions, paroxysmal A-fib on Eliquis, anemia of chronic disease, hypertension, depression/anxiety presented to the ED with chest discomfort and dyspnea. ?, Long history of signing off early at hemodialysis per nephrology only stays for 1-1/2 out of ?3-1/2-hour HD treatment ? ? ?Subjective: ?Short of breath, on BiPAP, awaiting hemodialysis and thoracentesis ? ?Assessment & Plan: ? ?Acute on chronic respiratory failure with hypoxia (HCC) ?Chronic bilateral pleural effusions, larger on right ?Acute on chronic diastolic CHF ?Severe PAH ?-Admitted with severe dyspnea, orthopnea, increased O2 requirements, imaging noted increasing large right pleural effusion and small left effusion, pulmonary edema  ?-Weaned off BiPAP  ?-Underwent thoracentesis 3/18, 800 mL fluid drained, unfortunately thoracentesis labs were not ordered on admission ?-Last echo with preserved EF, severe PAH, grade 1 DD ?-Unfortunately management limited by ability to tolerate full dialysis sessions, has been signing out 2 hours short  ?-Midodrine added on HD days ?-Nephrology following ?-Wean O2, currently requiring 6 L, on 2 L at baseline ? ?Hyperkalemia ?In setting of ESRD with nonadherence.   ?-Given insulin and Lokelma on admission,  ?-HD today ? ?ESRD on dialysis on HD MWF-but noncompliant ?Nephrology following, HD today ? ?Paroxysmal atrial fibrillation (HCC) ?Sinus rhythm on admission.  Continue diltiazem and Eliquis. ? ?Anemia of chronic disease ?-Hemoglobin down to 7.8, will replete, check anemia,, no overt bleeding noted ? ?Severe protein calorie malnutrition ?-Supplements ordered ? ?DVT prophylaxis: Apixaban ?Code Status: Full code ?Family Communication: Discussed patient in detail, no family at  bedside ?Disposition Plan: Home pending improvement in volume status, hypoxia ? ?Consultants:  ?Nephrology ?Procedures:  ? ?Antimicrobials:  ? ? ?Objective: ?Vitals:  ? 11/18/21 0930 11/18/21 1000 11/18/21 1028 11/18/21 1032  ?BP: (!) 107/47 (!) 119/25 (!) 113/39 (!) 113/41  ?Pulse: 78 84 85 67  ?Resp: '16 15 16 17  '$ ?Temp:      ?TempSrc:      ?SpO2: 92%  92% 93%  ?Weight:      ?Height:      ? ? ?Intake/Output Summary (Last 24 hours) at 11/18/2021 1136 ?Last data filed at 11/18/2021 1032 ?Gross per 24 hour  ?Intake 120 ml  ?Output 1500 ml  ?Net -1380 ml  ? ?Filed Weights  ? 11/15/21 2105 11/16/21 1215 11/18/21 0447  ?Weight: 50.3 kg 51.7 kg 49.9 kg  ? ? ?Examination: ? ?General exam: Chronically ill thinly built male sitting up in bed, AAOx3, no distress ?HEENT: Positive JVD ?CVS: S1-S2, regular rate rhythm ?Lungs: Decreased breath sounds, fine basilar rales ?Abdomen: Soft, nontender, bowel sounds present ?Extremities: No edema  ?Skin: No rashes ?Psychiatry:  Mood & affect appropriate.  ? ? ? ?Data Reviewed:  ? ?CBC: ?Recent Labs  ?Lab 11/15/21 ?1642 11/18/21 ?0832  ?WBC 3.6* 6.3  ?NEUTROABS 2.6  --   ?HGB 10.0* 7.8*  ?HCT 32.2* 24.9*  ?MCV 102.2* 103.3*  ?PLT 136* 165  ? ?Basic Metabolic Panel: ?Recent Labs  ?Lab 11/15/21 ?1642 11/15/21 ?1829 11/18/21 ?7096  ?NA 138  --  137  ?K 5.8*  --  6.1*  ?CL 97*  --  97*  ?CO2 24  --  26  ?GLUCOSE 79  --  89  ?BUN 45*  --  48*  ?CREATININE 6.65*  --  6.95*  ?CALCIUM 9.9  --  9.7  ?  PHOS  --  7.4*  --   ? ?GFR: ?Estimated Creatinine Clearance: 7.8 mL/min (A) (by C-G formula based on SCr of 6.95 mg/dL (H)). ?Liver Function Tests: ?Recent Labs  ?Lab 11/15/21 ?1642  ?AST 20  ?ALT 10  ?ALKPHOS 101  ?BILITOT 1.5*  ?PROT 7.9  ?ALBUMIN 3.7  ? ?No results for input(s): LIPASE, AMYLASE in the last 168 hours. ?No results for input(s): AMMONIA in the last 168 hours. ?Coagulation Profile: ?No results for input(s): INR, PROTIME in the last 168 hours. ?Cardiac Enzymes: ?No results for input(s):  CKTOTAL, CKMB, CKMBINDEX, TROPONINI in the last 168 hours. ?BNP (last 3 results) ?No results for input(s): PROBNP in the last 8760 hours. ?HbA1C: ?No results for input(s): HGBA1C in the last 72 hours. ?CBG: ?Recent Labs  ?Lab 11/15/21 ?2007 11/16/21 ?0148 11/16/21 ?0221 11/16/21 ?0630 11/16/21 ?0700  ?GLUCAP 166* 31* 79 51* 85  ? ?Lipid Profile: ?No results for input(s): CHOL, HDL, LDLCALC, TRIG, CHOLHDL, LDLDIRECT in the last 72 hours. ?Thyroid Function Tests: ?No results for input(s): TSH, T4TOTAL, FREET4, T3FREE, THYROIDAB in the last 72 hours. ?Anemia Panel: ?No results for input(s): VITAMINB12, FOLATE, FERRITIN, TIBC, IRON, RETICCTPCT in the last 72 hours. ?Urine analysis: ?   ?Component Value Date/Time  ? [place] YELLOW 05/24/2017 1729  ? APPEARANCEUR HAZY (A) 05/24/2017 1729  ? LABSPEC 1.011 05/24/2017 1729  ? PHURINE 5.0 05/24/2017 1729  ? GLUCOSEU 50 (A) 05/24/2017 1729  ? HGBUR SMALL (A) 05/24/2017 1729  ? [place] NEGATIVE 05/24/2017 1729  ? [place] NEGATIVE 05/24/2017 1729  ? PROTEINUR 100 (A) 05/24/2017 1729  ? UROBILINOGEN 0.2 04/09/2015 1527  ? NITRITE NEGATIVE 05/24/2017 1729  ? LEUKOCYTESUR TRACE (A) 05/24/2017 1729  ? ?Sepsis Labs: ?'@LABRCNTIP'$ (procalcitonin:4,lacticidven:4) ? ?) ?Recent Results (from the past 240 hour(s))  ?MRSA Next Gen by PCR, Nasal     Status: None  ? Collection Time: 11/16/21  4:47 PM  ? Specimen: Nasal Mucosa; Nasal Swab  ?Result Value Ref Range Status  ? MRSA by PCR Next Gen NOT DETECTED NOT DETECTED Final  ?  Comment: (NOTE) ?The GeneXpert MRSA Assay (FDA approved for NASAL specimens only), ?is one component of a comprehensive MRSA colonization surveillance ?program. It is not intended to diagnose MRSA infection nor to guide ?or monitor treatment for MRSA infections. ?Test performance is not FDA approved in patients less than 2 years ?old. ?Performed at [facility], [place] [address] [place], [place] ?[address] ?  ?  ? ?Radiology Studies: ?No results  found. ? ? ?Scheduled Meds: ? apixaban  2.5 mg Oral BID  ? Chlorhexidine Gluconate Cloth  6 each Topical Q0600  ? diltiazem  300 mg Oral Daily  ? feeding supplement  237 mL Oral BID BM  ? sodium chloride flush  3 mL Intravenous Q12H  ? ?Continuous Infusions: ? ? ? ? LOS: 3 days  ? ? ?Time spent: 1mn ? ? ?PDomenic Polite MD ?[facility] ? ? ?11/18/2021, 11:36 AM  ?  ?

## 2021-11-18 NOTE — Progress Notes (Signed)
?[facility] ?Progress Note  ? ?Subjective:    ?Seen on HD. No c/o's today.  ? ?Objective ?Vitals:  ? 11/18/21 0930 11/18/21 1000 11/18/21 1028 11/18/21 1032  ?BP: (!) 107/47 (!) 119/25 (!) 113/39 (!) 113/41  ?Pulse: 78 84 85 67  ?Resp: '16 15 16 17  '$ ?Temp:      ?TempSrc:      ?SpO2: 92%  92% 93%  ?Weight:      ?Height:      ? ?Physical Exam ?General: Chronically-ill appearing; awake and alert ?Heart: S1 and S2 ?Lungs: Clear anteriorly; diminished laterally ?Abdomen: soft and non-tender ?Extremities: No edema BLLE ?Dialysis Access: RIJ TDC; R BBF (+) thrill  ? ?Filed Weights  ? 11/15/21 2105 11/16/21 1215 11/18/21 0447  ?Weight: 50.3 kg 51.7 kg 49.9 kg  ? ? ?Intake/Output Summary (Last 24 hours) at 11/18/2021 1121 ?Last data filed at 11/18/2021 1032 ?Gross per 24 hour  ?Intake 120 ml  ?Output 1500 ml  ?Net -1380 ml  ? ? ? ?Additional Objective ?Labs: ?Basic Metabolic Panel: ?Recent Labs  ?Lab 11/15/21 ?1642 11/15/21 ?1829 11/18/21 ?5852  ?NA 138  --  137  ?K 5.8*  --  6.1*  ?CL 97*  --  97*  ?CO2 24  --  26  ?GLUCOSE 79  --  89  ?BUN 45*  --  48*  ?CREATININE 6.65*  --  6.95*  ?CALCIUM 9.9  --  9.7  ?PHOS  --  7.4*  --   ? ? ?Liver Function Tests: ?Recent Labs  ?Lab 11/15/21 ?1642  ?AST 20  ?ALT 10  ?ALKPHOS 101  ?BILITOT 1.5*  ?PROT 7.9  ?ALBUMIN 3.7  ? ? ?No results for input(s): LIPASE, AMYLASE in the last 168 hours. ?CBC: ?Recent Labs  ?Lab 11/15/21 ?1642 11/18/21 ?0832  ?WBC 3.6* 6.3  ?NEUTROABS 2.6  --   ?HGB 10.0* 7.8*  ?HCT 32.2* 24.9*  ?MCV 102.2* 103.3*  ?PLT 136* 165  ? ? ?Blood Culture ?   ?Component Value Date/Time  ? SDES BLOOD BLOOD LEFT FOREARM 09/19/2021 1443  ? SPECREQUEST  09/19/2021 1443  ?  BOTTLES DRAWN AEROBIC AND ANAEROBIC Blood Culture adequate volume  ? CULT  09/19/2021 1443  ?  NO GROWTH 5 DAYS ?Performed at [facility], [place] [address] [place], [place] [address] ?  ? REPTSTATUS 09/24/2021 FINAL 09/19/2021 1443  ? ? ?Cardiac Enzymes: ?No results for input(s): CKTOTAL,  CKMB, CKMBINDEX, TROPONINI in the last 168 hours. ?CBG: ?Recent Labs  ?Lab 11/15/21 ?2007 11/16/21 ?0148 11/16/21 ?0221 11/16/21 ?0630 11/16/21 ?0700  ?GLUCAP 166* 31* 79 51* 85  ? ? ?Iron Studies: No results for input(s): IRON, TIBC, TRANSFERRIN, FERRITIN in the last 72 hours. ?Lab Results  ?Component Value Date  ? INR 1.3 (H) 09/20/2021  ? INR 1.2 08/12/2021  ? INR 1.4 (H) 07/17/2021  ? ?Studies/Results: ?No results found. ? ?Medications: ? ? apixaban  2.5 mg Oral BID  ? Chlorhexidine Gluconate Cloth  6 each Topical Q0600  ? diltiazem  300 mg Oral Daily  ? feeding supplement  237 mL Oral BID BM  ? sodium chloride flush  3 mL Intravenous Q12H  ? ? ?Dialysis Orders: [facility], MWF ? 3.5h   400/800   2/2.5 bath  P2  45.5kg  RIJ TDC   Hep none ?- mircera 176mg q2weeks (last dose: 3/16) ? ?Assessment/Plan: ?1) ESRD:  h/o severe noncompliance, usually only stays for 1-1/2 hours. 4-5kg up today. HD today. ?  ?2) Anemia  of Chronic Kidney Disease: Hemoglobin 10. Last ESA given 3/16, not due til 3/30.  ?  ?3) Volume/ hypertension: attempt to achieve EDW as tolerated. S/p thoracentesis in IR 3/18-removed 800cc cloudy/rust color fluid. ?  ?4)  Secondary Hyperparathyroidism/Hyperphosphatemia: PO4 high, resume home binders ?  ?5)  Pleural effusion: S/p thora 3/18-please see above ?  ?6)  [facility], per last [facility] note-planning on doing AVG as an outpatient (last seen in Nov 2022) ?  ?7)  afib was on Eliquis ?  ?8)  h/o  rib fractures ?-per PMD ? ? ?[name]  MD ?11/18/2021, 11:22 AM ? ? ?  ?

## 2021-11-19 ENCOUNTER — Inpatient Hospital Stay (HOSPITAL_COMMUNITY): Payer: Medicare Other

## 2021-11-19 DIAGNOSIS — Z515 Encounter for palliative care: Secondary | ICD-10-CM | POA: Diagnosis not present

## 2021-11-19 DIAGNOSIS — J9621 Acute and chronic respiratory failure with hypoxia: Secondary | ICD-10-CM | POA: Diagnosis not present

## 2021-11-19 NOTE — Progress Notes (Signed)
?[facility] ?Progress Note  ? ?Subjective:    ?Seen on HD. No c/o's today.  ? ?Objective ?Vitals:  ? 11/19/21 0400 11/19/21 0734 11/19/21 1100 11/19/21 1524  ?BP:  (!) 124/40 (!) 131/30 (!) 116/39  ?Pulse:  72 71 78  ?Resp:  16 17 (!) 22  ?Temp:   98.3 ?F (36.8 ?C) 98 ?F (36.7 ?C)  ?TempSrc:   Oral Oral  ?SpO2:  91% 91% 90%  ?Weight: 50.3 kg     ?Height:      ? ?Physical Exam ?General: Chronically-ill appearing; awake and alert ?Heart: S1 and S2 ?Lungs: Clear anteriorly; diminished laterally ?Abdomen: soft and non-tender ?Extremities: No edema BLLE ?Dialysis Access: RIJ TDC; R BBF (+) thrill  ? ? ? ?Dialysis Orders: [facility], MWF ? 3.5h   400/800   2/2.5 bath  P2  45.5kg  RIJ TDC   Hep none ? - on 3/17 > hep B Ag was neg, and hep B Ab's were low/ not protective ?- mircera 142mg q2weeks (last dose: 3/16) ? ?Assessment/Plan: ?1) ESRD:  h/o severe noncompliance, usually only stays for 1-1/2 hours. 4-5kg up today. HD Wednesday. ?  ?2) Anemia of Chronic Kidney Disease: Hemoglobin 10. Last ESA given 3/16, not due til 3/30.  ?  ?3) Volume/ hypertension: attempt to achieve EDW as tolerated. S/p thoracentesis in IR 3/18-removed 800cc cloudy/rust color fluid. ?  ?4)  Secondary Hyperparathyroidism/Hyperphosphatemia: PO4 high, resume home binders ?  ?5)  Pleural effusion: S/p thora 3/18-please see above ?  ?6)  [facility]-planning on doing AVG as an outpatient (last seen in Nov 2022) ?  ?7)  afib -  on eliquis, Cardizem CD ?  ?8)  h/o  rib fractures ?-per PMD ? ? ?RKelly Splinter MD ?11/19/2021, 4:19 PM ? ?Filed Weights  ? 11/16/21 1215 11/18/21 0447 11/19/21 0400  ?Weight: 51.7 kg 49.9 kg 50.3 kg  ? ? ?Intake/Output Summary (Last 24 hours) at 11/19/2021 1619 ?Last data filed at 11/19/2021 1227 ?Gross per 24 hour  ?Intake 600 ml  ?Output --  ?Net 600 ml  ? ? ? ?Additional Objective ?Labs: ?Basic Metabolic Panel: ?Recent Labs  ?Lab 11/15/21 ?1642 11/15/21 ?1829 11/18/21 ?02831 ?NA 138  --   137  ?K 5.8*  --  6.1*  ?CL 97*  --  97*  ?CO2 24  --  26  ?GLUCOSE 79  --  89  ?BUN 45*  --  48*  ?CREATININE 6.65*  --  6.95*  ?CALCIUM 9.9  --  9.7  ?PHOS  --  7.4*  --   ? ? ?Liver Function Tests: ?Recent Labs  ?Lab 11/15/21 ?1642  ?AST 20  ?ALT 10  ?ALKPHOS 101  ?BILITOT 1.5*  ?PROT 7.9  ?ALBUMIN 3.7  ? ? ?No results for input(s): LIPASE, AMYLASE in the last 168 hours. ?CBC: ?Recent Labs  ?Lab 11/15/21 ?1642 11/18/21 ?0832  ?WBC 3.6* 6.3  ?NEUTROABS 2.6  --   ?HGB 10.0* 7.8*  ?HCT 32.2* 24.9*  ?MCV 102.2* 103.3*  ?PLT 136* 165  ? ? ?Blood Culture ?   ?Component Value Date/Time  ? SDES BLOOD BLOOD LEFT FOREARM 09/19/2021 1443  ? SPECREQUEST  09/19/2021 1443  ?  BOTTLES DRAWN AEROBIC AND ANAEROBIC Blood Culture adequate volume  ? CULT  09/19/2021 1443  ?  NO GROWTH 5 DAYS ?[facility] [address] [place] [address]?  ? REPTSTATUS 09/24/2021 FINAL 09/19/2021 1443  ? ? ?  Cardiac Enzymes: ?No results for input(s): CKTOTAL, CKMB, CKMBINDEX, TROPONINI in the last 168 hours. ?CBG: ?Recent Labs  ?Lab 11/15/21 ?2007 11/16/21 ?0148 11/16/21 ?0221 11/16/21 ?0630 11/16/21 ?0700  ?GLUCAP 166* 31* 79 51* 85  ? ? ?Iron Studies: No results for input(s): IRON, TIBC, TRANSFERRIN, FERRITIN in the last 72 hours. ?Lab Results  ?Component Value Date  ? INR 1.3 (H) 09/20/2021  ? INR 1.2 08/12/2021  ? INR 1.4 (H) 07/17/2021  ? ?Studies/Results: ?DG CHEST PORT 1 VIEW ? ?Result Date: 11/19/2021 ?CLINICAL DATA:  Hypoxia EXAM: PORTABLE CHEST 1 VIEW COMPARISON:  Previous studies including the examination of 11/16/2021 FINDINGS: Transverse diameter of heart is increased. Tip of dialysis catheter is seen in the superior vena cava. Central pulmonary vessels are prominent. Diffuse alveolar densities seen in both lungs, more so on the right side. There is slight improvement in aeration in the left parahilar region. There is moderate right pleural effusion with interval increase. There is small left pleural  effusion. There is no pneumothorax. IMPRESSION: Cardiomegaly. Pulmonary edema with possible slight improvement in the left parahilar region. There is moderate right pleural effusion with interval increase in size. Small left pleural effusion. Electronically Signed   By: Elmer Picker M.D.   On: 11/19/2021 12:21   ? ?Medications: ? ? apixaban  2.5 mg Oral BID  ? Chlorhexidine Gluconate Cloth  6 each Topical Q0600  ? diltiazem  300 mg Oral Daily  ? feeding supplement  237 mL Oral BID BM  ? sodium chloride flush  3 mL Intravenous Q12H  ? ?  ?

## 2021-11-19 NOTE — TOC Progression Note (Signed)
Transition of Care (TOC) - Progression Note  ? ? ?Patient Details  ?Name: Thomas Robinson ?MRN: [id number] ?Date of Birth: October 17, 1960 ? ?Transition of Care (TOC) CM/SW Contact  ?Zenon Mayo, RN ?Phone Number: ?11/19/2021, 2:34 PM ? ?Clinical Narrative:    ?from home with wife, pulm distress, ESRD MWF, has hx of getting of HD early, not tolerating oxygen wean, on 6-7 liters now, baseline is 2-3 liters at home. Thorancentesis done on 3/18.  TOC will continue to follow for dc needs.  ? ? ?  ?  ? ?Expected Discharge Plan and Services ?  ?  ?  ?  ?  ?                ?  ?  ?  ?  ?  ?  ?  ?  ?  ?  ? ? ?Social Determinants of Health (SDOH) Interventions ?  ? ?Readmission Risk Interventions ?Readmission Risk Prevention Plan 09/08/2021 01/15/2021 09/11/2020  ?Transportation Screening Complete Complete Complete  ?Medication Review Press photographer) Complete Complete -  ?PCP or Specialist appointment within 3-5 days of discharge - - Not Complete  ?PCP/Specialist Appt Not Complete comments - - first available appt is 09/26/20  ?[place] or Home Care Consult Complete Complete (No Data)  ?SW Recovery Care/Counseling Consult Complete Complete Complete  ?Palliative Care Screening Not Applicable Not Applicable Not Applicable  ?[place] Not Applicable Not Applicable Not Applicable  ?Some recent data might be hidden  ? ? ?

## 2021-11-19 NOTE — Progress Notes (Addendum)
?PROGRESS NOTE ? ? ? ?Thomas Robinson  NOM:[id number] DOB: [birth year] DOA: 11/15/2021 ?PCP: [name], PA  ?Narrative 60/M with history of ESRD on HD MWF, chronic diastolic CHF, chronic respiratory failure on 2 to 3 L O2, chronic pleural effusions, paroxysmal A-fib on Eliquis, anemia of chronic disease, hypertension, depression/anxiety presented to the ED with chest discomfort and dyspnea. ?, Long history of signing off early at hemodialysis per nephrology only stays for 1-1/2 out of ?3-1/2-hour HD treatment. ?-Required BiPAP on admission, ?Imaging with increasing right pleural effusion and small left pleural effusion, pulmonary edema ?-Underwent thoracentesis, volume removal on HD limited by patient's ability to tolerate full HD Rx ?-Still remains hypoxic, also refusing labs often ? ? ?Subjective: Feels okay current amount of oxygen-7L, denies any chest pain ?-will go home tomorrow whether we think he is stable or not ? ? ?Assessment & Plan: ? ?Acute on chronic respiratory failure with hypoxia (HCC) ?Chronic bilateral pleural effusions, larger on right ?Acute on chronic diastolic CHF ?Severe PAH ?-Admitted with severe dyspnea, orthopnea, increased O2 requirements, imaging noted increasing large right pleural effusion and small left effusion, pulmonary edema  ?-Weaned off BiPAP  ?-Underwent thoracentesis 3/18, 800 mL fluid drained, unfortunately thoracentesis labs were not ordered on admission ?-Last echo with preserved EF, severe PAH, grade 1 DD ?-Unfortunately management limited by ability to tolerate full dialysis sessions, has been signing out 2 hours short, yesterday finally had 2.5 hour HD Rx ?-Midodrine added on HD days ?-Currently requiring 7 L O2, on 2 to 3 L at baseline, will repeat chest x-ray ?-Wean O2, palliative consulted as well ? ?Hyperkalemia ?In setting of ESRD with nonadherence.   ?-Given insulin and Lokelma on admission,  ?-Dialyzed yesterday ? ?ESRD on dialysis on HD MWF-but  noncompliant ?Nephrology following, dialyzed yesterday ? ?Paroxysmal atrial fibrillation (HCC) ?-in NSR,continue diltiazem and Eliquis. ? ?Anemia of chronic disease ?-Hemoglobin down to 7.8 yesterday, repeat pending (worsening refuses labs often), anemia panel pending ?-Continue EPO with HD ? ?Severe protein calorie malnutrition ?-Supplements ordered ? ?DVT prophylaxis: Apixaban ?Code Status: Full code ?Family Communication: Discussed patient in detail, no family at bedside ?Disposition Plan: Home pending improvement in volume status, hypoxia-high risk of leaving AMA ? ?Consultants:  ?Nephrology ?Procedures:  ? ?Antimicrobials:  ? ? ?Objective: ?Vitals:  ? 11/18/21 2250 11/19/21 0350 11/19/21 0400 11/19/21 0734  ?BP: (!) 135/31 (!) 137/37  (!) 124/40  ?Pulse: 89 81  72  ?Resp: '18 13  16  '$ ?Temp: 99.2 ?F (37.3 ?C) 99 ?F (37.2 ?C)    ?TempSrc: Oral Oral    ?SpO2: 93% 95%  91%  ?Weight:   50.3 kg   ?Height:      ? ? ?Intake/Output Summary (Last 24 hours) at 11/19/2021 1048 ?Last data filed at 11/19/2021 1044 ?Gross per 24 hour  ?Intake 600 ml  ?Output --  ?Net 600 ml  ? ?Filed Weights  ? 11/16/21 1215 11/18/21 0447 11/19/21 0400  ?Weight: 51.7 kg 49.9 kg 50.3 kg  ? ? ?Examination: ? ?General exam: Chronically ill difficult male sitting up in bed, AAOx3, no distress ?HEENT: Positive JVD ?CVS: S1-S2, regular rhythm, systolic murmur left ?Lungs: Decreased breath sounds at the bases, fine basilar rales as well ?Abdomen: Soft, nontender bowel sounds present ?Extremities: No edema  ?Skin: No rashes ?Psychiatry:  Mood & affect appropriate.  ? ? ? ?Data Reviewed:  ? ?CBC: ?Recent Labs  ?Lab 11/15/21 ?1642 11/18/21 ?0832  ?WBC 3.6* 6.3  ?NEUTROABS 2.6  --   ?HGB  10.0* 7.8*  ?HCT 32.2* 24.9*  ?MCV 102.2* 103.3*  ?PLT 136* 165  ? ?Basic Metabolic Panel: ?Recent Labs  ?Lab 11/15/21 ?1642 11/15/21 ?1829 11/18/21 ?8280  ?NA 138  --  137  ?K 5.8*  --  6.1*  ?CL 97*  --  97*  ?CO2 24  --  26  ?GLUCOSE 79  --  89  ?BUN 45*  --  48*   ?CREATININE 6.65*  --  6.95*  ?CALCIUM 9.9  --  9.7  ?PHOS  --  7.4*  --   ? ?GFR: ?Estimated Creatinine Clearance: 7.8 mL/min (A) (by C-G formula based on SCr of 6.95 mg/dL (H)). ?Liver Function Tests: ?Recent Labs  ?Lab 11/15/21 ?1642  ?AST 20  ?ALT 10  ?ALKPHOS 101  ?BILITOT 1.5*  ?PROT 7.9  ?ALBUMIN 3.7  ? ?No results for input(s): LIPASE, AMYLASE in the last 168 hours. ?No results for input(s): AMMONIA in the last 168 hours. ?Coagulation Profile: ?No results for input(s): INR, PROTIME in the last 168 hours. ?Cardiac Enzymes: ?No results for input(s): CKTOTAL, CKMB, CKMBINDEX, TROPONINI in the last 168 hours. ?BNP (last 3 results) ?No results for input(s): PROBNP in the last 8760 hours. ?HbA1C: ?No results for input(s): HGBA1C in the last 72 hours. ?CBG: ?Recent Labs  ?Lab 11/15/21 ?2007 11/16/21 ?0148 11/16/21 ?0221 11/16/21 ?0630 11/16/21 ?0700  ?GLUCAP 166* 31* 79 51* 85  ? ?Lipid Profile: ?No results for input(s): CHOL, HDL, LDLCALC, TRIG, CHOLHDL, LDLDIRECT in the last 72 hours. ?Thyroid Function Tests: ?No results for input(s): TSH, T4TOTAL, FREET4, T3FREE, THYROIDAB in the last 72 hours. ?Anemia Panel: ?Recent Labs  ?  11/18/21 ?0832  ?RETICCTPCT 2.8  ? ?Urine analysis: ?   ?Component Value Date/Time  ? [place] YELLOW 05/24/2017 1729  ? APPEARANCEUR HAZY (A) 05/24/2017 1729  ? LABSPEC 1.011 05/24/2017 1729  ? PHURINE 5.0 05/24/2017 1729  ? GLUCOSEU 50 (A) 05/24/2017 1729  ? HGBUR SMALL (A) 05/24/2017 1729  ? [place] NEGATIVE 05/24/2017 1729  ? [place] NEGATIVE 05/24/2017 1729  ? PROTEINUR 100 (A) 05/24/2017 1729  ? UROBILINOGEN 0.2 04/09/2015 1527  ? NITRITE NEGATIVE 05/24/2017 1729  ? LEUKOCYTESUR TRACE (A) 05/24/2017 1729  ? ?Sepsis Labs: ?'@LABRCNTIP'$ (procalcitonin:4,lacticidven:4) ? ?) ?Recent Results (from the past 240 hour(s))  ?MRSA Next Gen by PCR, Nasal     Status: None  ? Collection Time: 11/16/21  4:47 PM  ? Specimen: Nasal Mucosa; Nasal Swab  ?Result Value Ref Range Status  ? MRSA by  PCR Next Gen NOT DETECTED NOT DETECTED Final  ?  Comment: (NOTE) ?The GeneXpert MRSA Assay (FDA approved for NASAL specimens only), ?is one component of a comprehensive MRSA colonization surveillance ?program. It is not intended to diagnose MRSA infection nor to guide ?or monitor treatment for MRSA infections. ?Test performance is not FDA approved in patients less than 2 years ?old. ?Performed at [facility], [place] [address], [place], [place] ?[address] ?  ?  ? ?Radiology Studies: ?No results found. ? ? ?Scheduled Meds: ? apixaban  2.5 mg Oral BID  ? Chlorhexidine Gluconate Cloth  6 each Topical Q0600  ? diltiazem  300 mg Oral Daily  ? feeding supplement  237 mL Oral BID BM  ? sodium chloride flush  3 mL Intravenous Q12H  ? ?Continuous Infusions: ? ? ? ? LOS: 4 days  ? ? ?Time spent: 60mn ? ? ?PDomenic Polite MD ?[facility] ? ? ?11/19/2021, 10:48 AM  ?  ?

## 2021-11-19 NOTE — Progress Notes (Signed)
[facility]) Hospital Liaison note: ? ?Notified by Tacey Ruiz, NP of request for [facility] Palliative Care services. Will continue to follow for disposition. ? ?Please call with any outpatient palliative questions or concerns. ? ?Thank you for the opportunity to participate in this patient's care. ? ?Thank you, ?Darell Georgann Housekeeper, LPN ?[facility] Hospital Liaison ?[phone] ?

## 2021-11-19 NOTE — Progress Notes (Signed)
Pt refused CHG bath.  ?
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
## 2021-11-20 DIAGNOSIS — N186 End stage renal disease: Secondary | ICD-10-CM | POA: Diagnosis not present

## 2021-11-20 DIAGNOSIS — I5033 Acute on chronic diastolic (congestive) heart failure: Secondary | ICD-10-CM

## 2021-11-20 DIAGNOSIS — J90 Pleural effusion, not elsewhere classified: Secondary | ICD-10-CM

## 2021-11-20 DIAGNOSIS — J9621 Acute and chronic respiratory failure with hypoxia: Secondary | ICD-10-CM | POA: Diagnosis not present

## 2021-11-20 DIAGNOSIS — E43 Unspecified severe protein-calorie malnutrition: Secondary | ICD-10-CM | POA: Diagnosis present

## 2021-11-20 DIAGNOSIS — Z992 Dependence on renal dialysis: Secondary | ICD-10-CM

## 2021-11-20 DIAGNOSIS — I480 Paroxysmal atrial fibrillation: Secondary | ICD-10-CM

## 2021-11-20 LAB — CBC
HCT: 23.5 % — ABNORMAL LOW (ref 39.0–52.0)
Hemoglobin: 7.5 g/dL — ABNORMAL LOW (ref 13.0–17.0)
MCH: 31.6 pg (ref 26.0–34.0)
MCHC: 31.9 g/dL (ref 30.0–36.0)
MCV: 99.2 fL (ref 80.0–100.0)
Platelets: 198 10*3/uL (ref 150–400)
RBC: 2.37 MIL/uL — ABNORMAL LOW (ref 4.22–5.81)
RDW: 15.3 % (ref 11.5–15.5)
WBC: 5.7 10*3/uL (ref 4.0–10.5)
nRBC: 0 % (ref 0.0–0.2)

## 2021-11-20 LAB — BASIC METABOLIC PANEL
Anion gap: 13 (ref 5–15)
BUN: 60 mg/dL — ABNORMAL HIGH (ref 8–23)
CO2: 26 mmol/L (ref 22–32)
Calcium: 10.3 mg/dL (ref 8.9–10.3)
Chloride: 96 mmol/L — ABNORMAL LOW (ref 98–111)
Creatinine, Ser: 7 mg/dL — ABNORMAL HIGH (ref 0.61–1.24)
GFR, Estimated: 8 mL/min — ABNORMAL LOW (ref >60)
Glucose, Bld: 123 mg/dL — ABNORMAL HIGH (ref 70–99)
Potassium: 5.4 mmol/L — ABNORMAL HIGH (ref 3.5–5.1)
Sodium: 135 mmol/L (ref 135–145)

## 2021-11-20 MED ORDER — SODIUM ZIRCONIUM CYCLOSILICATE 10 G PO PACK
10.0000 g | PACK | ORAL | Status: AC
Start: 2021-11-21 — End: 2021-11-21
  Administered 2021-11-21: 10 g via ORAL
  Filled 2021-11-20: qty 1

## 2021-11-20 MED ORDER — OXYCODONE HCL 5 MG PO TABS
5.0000 mg | ORAL_TABLET | Freq: Four times a day (QID) | ORAL | Status: DC | PRN
  Administered 2021-11-20 – 2021-11-23 (×6): 5 mg via ORAL
  Filled 2021-11-20 (×6): qty 1

## 2021-11-20 NOTE — Progress Notes (Signed)
@  [address] Dr[address] Crane, on-call for attending, text-paged via secure chat regarding pt's endorsement of pain and lack of PIV(attempted placement twice unsuccessfully x2) for ordered PRN Dilaudid. Page promptly returned and MD placed order for PRN Oxy. Medication administered, will continue to monitor. ?

## 2021-11-20 NOTE — Progress Notes (Signed)
Pt receives out-pt HD at [facility] on MWF. Pt has a 11:15 chair time.  ? ?Melven Sartorius ?Renal Navigator ?[phone] ?

## 2021-11-20 NOTE — Assessment & Plan Note (Addendum)
Patient had thoracentesis with 800 cc fluid removed. ?Transudate pleural effusion.  ?Continue oxymetry monitoring.  ?Improved oxygenation after ultrafiltration  ?

## 2021-11-20 NOTE — Progress Notes (Signed)
?Progress Note ? ? ?Patient: Thomas Robinson SJG:[id number] DOB: 10/22/1960 DOA: 11/15/2021     5 ?DOS: the patient was seen and examined on 11/20/2021 ?  ?Brief hospital course: ?Thomas Robinson is a 61 y.o. male with medical history significant for ESRD on MWF HD, HFpEF (EF 60 to 65% by TTE 08/12/2021)chronic respiratory failure with hypoxia on 2-3 L O2 via [place], chronic pleural effusions, paroxysmal atrial fibrillation on Eliquis, anemia of chronic kidney disease, HTN, HLD, depression/anxiety who is admitted with acute on chronic hypoxic respiratory failure secondary to HFpEF and recurrent large right pleural effusion. ? ?At home patient had chest pressure and dyspnea. Had to increase with supplemental flow of 02, positive orthopnea and dyspnea on exertion. On his initial physical examination his blood pressure 181/100, RR 32, HR 78, temp 98.1 and 02 saturation 98% on 5 L/min per [place]. Lungs with decreased breath sounds with increased work of breathing, heart with S1 and S2 present and rhythmic, no gallops, abdomen soft and no lower extremity edema.  ? ?Na 138, K 5,3, CL 100, bicarbonate 24. Glucose 77, bun 40 and cr 5.72 ?BNP 2,899 ?High sensitive troponin 50 and 55  ?Wbc 3,6, hgb 10,0 hct 32.2 plt 136  ?Sars covid 19 negative  ? ?Chest radiograph with cardiomegaly with bilateral interstitial infiltrates more at the right lower lobe, positive loculated right pleural effusion.  ?Ct chest with right sided loculated pleural effusion, near complete right lower lobe compression atelectasis. Bilateral ground glass opacities. Stable aneurysmal dilatation of ascending aorta 4.3 cm.  ? ?EKG 93 bpm, right axis deviation, with normal intervals, sinus rhythm with LVH, no significant ST segment and T wave changes.  ? ?Patient was placed on on invasive mechanical ventilation and underwent hemodialysis with ultrafiltration. ?Right sided thoracentesis with 800 ml fluid removed.  ? ?He was transitioned to nasal cannula but continue  to have high oxygen requirements.  ? ?03/22 declining inpatient hemodialysis and willing to leave the hospital against medical advice.  ? ? ?Assessment and Plan: ?* Acute on chronic respiratory failure with hypoxia (HCC) ?Acute on chronic bilateral pleural effusions, larger on right ?Acute on chronic HFpEF ?Patient continue to have volume overload with persistent increased 02 requirements.,  ? ?Neet to continue ultrafiltration on HD, close follow up on blood pressure and oxygenation  ? ?Pleural effusion, bilateral ?Patient had thoracentesis with 800 cc fluid removed  ?Continue oxymetry monitoring and keep negative fluid balance/.  ? ?ESRD on dialysis on HD MWF-but noncompliant ?Hyperkalemia.  ?Patient had been refusing HD, but at the same time dose not want to go under comfort care.  ?He agree for HD today.  ?Continue volume overloaded.  ? ?Today had acute hypoxemia during HD, required high oxygen per non rebreather.  ?Continue close monitoring.  ? ?Acute on chronic heart failure with preserved ejection fraction (HFpEF) ([place]) ?Continue volume control with HD.  ?Patient not compliant with renal replacement therapy.  ? ?Paroxysmal atrial fibrillation ([place]) ?Continue rate control with diltiazem and anticoagulation with apixaban.  ? ?Protein-calorie malnutrition, severe ([place]) ?Continue with nutritional supplements.  ? ? ? ? ?  ? ?Subjective: patient wanting to leave the hospital ama. He accepted HD today. Had severe hypoxemia on HD  ? ?Physical Exam: ?Vitals:  ? 11/20/21 1430 11/20/21 1500 11/20/21 1524 11/20/21 1526  ?BP: (!) 158/91 (!) 179/98 (!) 179/98 (!) 191/95  ?Pulse:      ?Resp: (!) 27 (!) '24 20 20  '$ ?Temp:      ?TempSrc:      ?  SpO2:      ?Weight:      ?Height:      ? ?Neurology awake and alert, deconditioned and ill looking appearing  ?ENT with pallor ?Cardiovascular with S1 and S2 present, positive systolic murmur at the apex  ?Lungs with rales bilaterally ?Abdomen soft ? No lower extremity edema  ?Data  Reviewed: ? ? ? ?Family Communication: I spoke over the phone with the patient's care giver  about patient's  condition, plan of care, prognosis and all questions were addressed.  ? ?Disposition: ?Status is: Inpatient ?Remains inpatient appropriate because: volume overload  ? Planned Discharge Destination: Home ? ? ?Author: ?Tawni Millers, MD ?11/20/2021 6:29 PM ? ?For on call review [email or web address].  ?

## 2021-11-20 NOTE — Progress Notes (Signed)
At time of this note, pt complaining of swelling in L FA/ Elbow. This RN observed and while it does appear swollen, it doesn't look any larger than the beginning of shift. Pt requesting on-call MD come and look at it. Will page MD and update. Pulse palpable distal to "swelling" (radial +2). ? ?Addendum: MD endorsed reception of page. No orders received. ?

## 2021-11-20 NOTE — TOC Initial Note (Addendum)
Transition of Care (TOC) - Initial/Assessment Note  ? ? ?Patient Details  ?Name: Thomas Robinson ?MRN: [id number] ?Date of Birth: Oct 23, 1960 ? ?Transition of Care (TOC) CM/SW Contact:    ?Zenon Mayo, RN ?Phone Number: ?11/20/2021, 11:17 AM ? ?Clinical Narrative:                 ?NCM spoke with patient at bedside, he has been set up with outpatient palliative services per palliative liason.  He is active with [facility] and he states he would like to continue [facility] services with them.  NCM confirmed with Amy with [facility] she will contact this NCM back to inform which services he was receiving.  Also he has home oxygen with [employer] 3 to 7 liters per patient , he also has a walker and w/chair at home.  Has friend [name] who will transport him home at discharge per patient, but he does not want this NCM to call her, he states she is on the do not call list for now.  He states he will be getting HD today, and then he would like to go home.  TOC will continue to follow for dc needs. ? ?Per Amy with [facility], he is active with HHPT with them.  NCM asked MD for orders. ? ?Expected Discharge Plan: [place] ?Barriers to Discharge: Continued Medical Work up ? ? ?Patient Goals and CMS Choice ?Patient states their goals for this hospitalization and ongoing recovery are:: return home and resume [facility] services ?CMS Medicare.gov Compare Post Acute Care list provided to:: Patient ?Choice offered to / list presented to : Patient ? ?Expected Discharge Plan and Services ?Expected Discharge Plan: [place] ?In-house Referral: NA ?Discharge Planning Services: CM Consult ?Post Acute Care Choice: Resumption of Svcs/PTA Provider, Home Health ?Living arrangements for the past 2 months: Apartment ?                ?  ?DME Agency: NA ?  ?  ?  ?HH Arranged: PT ?[place] Agency: [place] ?Date HH Agency Contacted: 11/20/21 ?Time [place]: 6712 ?Representative spoke with at [place]: [place] ? ?Prior Living Arrangements/Services ?Living arrangements for the past 2 months: Apartment ?Lives with:: Significant Other ?Patient language and need for interpreter reviewed:: Yes ?Do you feel safe going back to the place where you live?: Yes      ?Need for Family Participation in Patient Care: Yes (Comment) ?Care giver support system in place?: Yes (comment) ?Current home services:  (rolling walker and w/chair and home oxygen) ?  ? ?Activities of Daily Living ?  ?ADL Screening (condition at time of admission) ?Patient's cognitive ability adequate to safely complete daily activities?: Yes ?Is the patient deaf or have difficulty hearing?: No ?Does the patient have difficulty seeing, even when wearing glasses/contacts?: No ?Does the patient have difficulty concentrating, remembering, or making decisions?: No ?Patient able to express need for assistance with ADLs?: Yes ?Does the patient have difficulty dressing or bathing?: No ?Independently performs ADLs?: No ?Communication: Independent ?Dressing (OT): Independent ?Grooming: Needs assistance ?Is this a change from baseline?: Pre-admission baseline ?Feeding: Independent ?Bathing: Needs assistance ?Is this a change from baseline?: Pre-admission baseline ?Toileting: Needs assistance ?Is this a change from baseline?: Pre-admission baseline ?In/Out Bed: Needs assistance ?Is this a change from baseline?: Pre-admission baseline ?Walks in Home: Needs assistance ?Is this a change from baseline?: Pre-admission baseline ?Does the patient have difficulty walking or climbing stairs?: Yes ?  Weakness of Legs: Both ?Weakness of Arms/Hands: None ? ?Permission Sought/Granted ?  ?  ?   ?   ?   ?   ? ?Emotional Assessment ?Appearance:: Appears stated age ?Attitude/Demeanor/Rapport:  (Appropriate) ?Affect (typically observed): Appropriate ?Orientation: : Oriented to Place, Oriented to Self, Oriented to  Time, Oriented to Situation ?Alcohol / Substance Use: Not  Applicable ?Psych Involvement: No (comment) ? ?Admission diagnosis:  Pleural effusion [J90] ?Acute on chronic respiratory failure with hypoxia (HCC) [J96.21] ?Patient Active Problem List  ? Diagnosis Date Noted  ? Depression with anxiety 11/15/2021  ? Pleural effusion, bilateral 10/15/2021  ? Left rib fracture 10/14/2021  ? Hypoxemia 10/14/2021  ? Pleural effusion, right 10/14/2021  ? Protein-calorie malnutrition, moderate ([place]) 10/14/2021  ? Paroxysmal atrial fibrillation ([place]) 09/29/2021  ? Acute on chronic respiratory failure with hypoxemia due to volume overload due to noncompliance with hemodialysis  09/19/2021  ? Persistent atrial fibrillation ([place]) 09/19/2021  ? Pulmonary emboli ([place]) 09/19/2021  ? Cachexia ([place]) 09/19/2021  ? Protein-calorie malnutrition, severe 09/10/2021  ? Chronic respiratory failure with hypoxia ([place]) 09/07/2021  ? Fever 09/07/2021  ? Hypotension 09/07/2021  ? Acute on chronic diastolic (congestive) heart failure ([place]) 08/26/2021  ? Fluid overload 08/25/2021  ? Atrial fibrillation with RVR ([place]) 08/25/2021  ? Sacral pressure sore 08/12/2021  ? Shortness of breath 07/16/2021  ? Balanitis 07/16/2021  ? Vomiting   ? Gastritis and gastroduodenitis   ? Grade III hemorrhoids   ? Decubitus ulcer of coccyx, stage 2 ([place]) 07/08/2021  ? Prolonged QT interval 07/08/2021  ? Hematochezia   ? Atrial flutter ([place])   ? Pressure injury of skin 07/02/2021  ? Abnormal chest CT 06/30/2021  ? Recurrent pleural effusion on right 06/30/2021  ? SIRS (systemic inflammatory response syndrome) ([place]) 06/30/2021  ? Acute hypoxemic respiratory failure ([place]) 05/18/2021  ? Bronchitis 05/18/2021  ? Preoperative cardiovascular examination 03/06/2021  ? CHF (congestive heart failure) ([place]) 02/17/2021  ? Cellulitis of arm, left 02/01/2021  ? Cellulitis of left lower limb 02/01/2021  ? Cellulitis, unspecified 02/01/2021  ? Left arm swelling 01/25/2021  ? Acute and chronic respiratory failure with hypoxia ([place]) 01/13/2021  ?  Acute on chronic respiratory failure with hypoxia ([place]) 01/12/2021  ? Concussion with no loss of consciousness 01/12/2021  ? Laceration without foreign body of left forearm, initial encounter 01/12/2021  ? Age-related physical debility 09/20/2020  ? Allergy, unspecified, initial encounter 09/20/2020  ? Nausea 09/20/2020  ? Pain, unspecified 09/20/2020  ? Pruritus, unspecified 09/20/2020  ? S/P cardiac cath 08/27/2020  ? Acute encephalopathy 08/26/2020  ? Peripheral neuropathy 08/26/2020  ? Elevated troponin 08/25/2020  ? Arthritis   ? Asthma   ? Cataract   ? Cerebral infarction due to thrombosis of cerebral artery ([place])   ? History of CVA (cerebrovascular accident)   ? Chronic kidney disease   ? Dyspnea   ? Hyperlipidemia   ? Hypertension   ? Kidney failure   ? Restless leg syndrome   ? Degenerative lumbar spinal stenosis 06/26/2020  ? Diastolic dysfunction 29/93/7169  ? Renovascular hypertension 06/22/2020  ? Demand ischemia ([place]) 06/18/2020  ? LVH (left ventricular hypertrophy) due to hypertensive disease, with heart failure ([place]) 06/18/2020  ? Myofascial pain syndrome 06/12/2020  ? Encounter for removal of sutures 03/29/2020  ? History of fusion of cervical spine 03/26/2020  ? Non-compliance with renal dialysis ([place]) 02/08/2020  ? Pulmonary edema 01/23/2020  ? Cervical disc herniation 01/04/2020  ? Tachycardia 01/04/2020  ? SOB (shortness  of breath)   ? Hypoxia 12/30/2019  ? Chronic low back pain 11/24/2019  ? Degeneration of lumbar intervertebral disc 11/24/2019  ? Lumbar radiculopathy 11/24/2019  ? Left shoulder pain 11/11/2019  ? Anaphylactic shock, unspecified, sequela 06/10/2019  ? Rib fractures 06/05/2019  ? Fall at home, initial encounter 06/04/2019  ? Right rib fracture 06/04/2019  ? Lung contusion 06/04/2019  ? Acute respiratory failure with hypoxia ([place]) 06/04/2019  ? Anemia in ESRD (end-stage renal disease) ([place]) 06/04/2019  ? GERD without esophagitis 06/04/2019  ? Chronic diastolic (congestive) heart  failure ([place]) 06/04/2019  ? Fall 06/04/2019  ? Thoracic ascending aortic aneurysm 06/04/2019  ? Acute on chronic heart failure with preserved ejection fraction (HFpEF) ([place]) 05/16/2019  ? Carpal tunnel syndrome of

## 2021-11-20 NOTE — Progress Notes (Signed)
Mobility Specialist Progress Note: ? ? 11/20/21 1300  ?Mobility  ?Activity  ?(repositioned in bed)  ?Level of Assistance +2 (takes two people)  ?Assistive Device None  ?Activity Response Tolerated well  ?$Mobility charge 1 Mobility  ? ?RN requesting assistance with repositioning pt in bed. SpO2 71% on 7LO2 with good pleth. Pt declined sitting in chair and ambulation at this time. HD soon, will f/u if time allows.  ? ?Nelta Numbers ?Acute Rehab ?Phone: [phone] ?Office Phone: [phone] ? ?

## 2021-11-20 NOTE — Progress Notes (Signed)
?   11/20/21 1524  ?Vitals  ?BP (!) 179/98  ?ECG Heart Rate 77  ?Resp 20  ?During Hemodialysis Assessment  ?HD Safety Checks Performed Yes  ?Intra-Hemodialysis Comments Tx completed  ?Post-Hemodialysis Assessment  ?Rinseback Volume (mL) 250 mL  ?KECN 285 V  ?Dialyzer Clearance Lightly streaked  ?Duration of HD Treatment -hour(s) 2 hour(s)  ?Hemodialysis Intake (mL) 700 mL  ?UF Total -Machine (mL) 1300 mL  ?Net UF (mL) 600 mL  ?Tolerated HD Treatment  ?(Asymptomatic hypotension mid tx with repeat BP x3.  Bolus 200 ml administered and UF off, BP improved.)  ?Post-Hemodialysis Comments This pt was placed on Nonrebreather at start of treatment for O2 sats of 79% and visible work of breathing.  Sats came up to 100%, and he was resting with slightly labored breaths, however no use of accessory muscles. Attempted to titrate pt down off Non Rebreather to [place] 8 L, however pt desated to 69% with good waveform on monitor.  Non rebreather placed back on pt 15L,  subsequently he returned to a comfortable state. Primary RN and Provider made aware.  Pt continued to notify staff he wishes to  discharge post dialysis despite oxygenation education.  ?Education / Care Plan  ?Dialysis Education Provided Yes  ?Hemodialysis Catheter Right Internal jugular Double lumen Permanent (Tunneled)  ?Placement Date/Time: 07/17/21 5573   Time Out: Correct patient;Correct site;Correct procedure  Maximum sterile barrier precautions: Hand hygiene;Cap;Mask;Sterile gown;Large sterile sheet;Sterile gloves  Site Prep: Chlorhexidine (preferred);Skin Prep C...  ?Site Condition No complications  ?Blue Lumen Status Flushed;Heparin locked  ?Red Lumen Status Flushed;Heparin locked  ?Dressing Type Transparent  ?Dressing Status Antimicrobial disc in place;Clean, Dry, Intact  ?Post treatment catheter status Capped and Clamped  ? ? ?

## 2021-11-20 NOTE — Progress Notes (Signed)
?[facility] ?Progress Note  ? ?Subjective:    ?Seen in room. Wants to go home after HD today.  ? ?Objective ?Vitals:  ? 11/20/21 0742 11/20/21 1320 11/20/21 1323 11/20/21 1330  ?BP: (!) 141/41 (!) 187/52 (!) 197/67 (!) 170/45  ?Pulse: 77 80    ?Resp: (!) 21 (!) 31 (!) 27 17  ?Temp: 98 ?F (36.7 ?C) (!) 97.3 ?F (36.3 ?C)    ?TempSrc: Oral Temporal    ?SpO2: 93% (!) 79% 100% 100%  ?Weight:  49.1 kg    ?Height:      ? ?Physical Exam ?General: Chronically-ill appearing; awake and alert ?Heart: S1 and S2 ?Lungs: Clear anteriorly; diminished laterally ?Abdomen: soft and non-tender ?Extremities: No edema BLLE ?Dialysis Access: RIJ TDC; R BBF (+) thrill  ? ? ? ?Dialysis Orders: [facility], MWF ? 3.5h   400/800   2/2.5 bath  P2  45.5kg  RIJ TDC   Hep none ? - on 3/17 > hep B Ag was neg, and hep B Ab's were low/ not protective ?- mircera 136mg q2weeks (last dose: 3/16) ? ?Assessment/Plan: ?1) ESRD:  hx noncompliance on not staying for full sessions. 3kg up today. HD today ?  ?2) Anemia of Chronic Kidney Disease: Hemoglobin 10. Last ESA given 3/16, not due til 3/30.  ?  ?3) Volume/ hypertension: attempt to achieve EDW as tolerated. S/p thoracentesis in IR 3/18-removed 800cc cloudy/rust color fluid. ?  ?4)  Secondary Hyperparathyroidism/Hyperphosphatemia: PO4 high, resume home binders ?  ?5)  Pleural effusion: S/p thora 3/18-please see above ?  ?6)  [facility]-planning on doing AVG as an outpatient (last seen in Nov 2022) ?  ?7)  afib -  on eliquis, Cardizem CD ?  ?8)  h/o  rib fractures ?-per PMD ? ?9) Dispo - okay for dc today after hemodialysis. Have d/w pmd.   ? ? ?RKelly Splinter MD ?11/19/2021, 4:19 PM ? ?Filed Weights  ? 11/19/21 0400 11/20/21 0438 11/20/21 1320  ?Weight: 50.3 kg 48.1 kg 49.1 kg  ? ? ?Intake/Output Summary (Last 24 hours) at 11/20/2021 1414 ?Last data filed at 11/20/2021 1100 ?Gross per 24 hour  ?Intake 478 ml  ?Output --  ?Net 478 ml  ? ? ? ?Additional  Objective ?Labs: ?Basic Metabolic Panel: ?Recent Labs  ?Lab 11/15/21 ?1642 11/15/21 ?1829 11/18/21 ?0093203/22/23 ?1220  ?NA 138  --  137 135  ?K 5.8*  --  6.1* 5.4*  ?CL 97*  --  97* 96*  ?CO2 24  --  26 26  ?GLUCOSE 79  --  89 123*  ?BUN 45*  --  48* 60*  ?CREATININE 6.65*  --  6.95* 7.00*  ?CALCIUM 9.9  --  9.7 10.3  ?PHOS  --  7.4*  --   --   ? ? ?Liver Function Tests: ?Recent Labs  ?Lab 11/15/21 ?1642  ?AST 20  ?ALT 10  ?ALKPHOS 101  ?BILITOT 1.5*  ?PROT 7.9  ?ALBUMIN 3.7  ? ? ?No results for input(s): LIPASE, AMYLASE in the last 168 hours. ?CBC: ?Recent Labs  ?Lab 11/15/21 ?1642 11/18/21 ?0832 11/20/21 ?1220  ?WBC 3.6* 6.3 5.7  ?NEUTROABS 2.6  --   --   ?HGB 10.0* 7.8* 7.5*  ?HCT 32.2* 24.9* 23.5*  ?MCV 102.2* 103.3* 99.2  ?PLT 136* 165 198  ? ? ?Blood Culture ?   ?Component Value Date/Time  ? SDES BLOOD BLOOD LEFT FOREARM 09/19/2021 1443  ? SPECREQUEST  09/19/2021 1443  ?  BOTTLES DRAWN AEROBIC AND ANAEROBIC Blood Culture adequate volume  ? CULT  09/19/2021 1443  ?  NO GROWTH 5 DAYS ?Performed at [facility], [place] [address], [place], [place] [address] ?  ? REPTSTATUS 09/24/2021 FINAL 09/19/2021 1443  ? ? ?Cardiac Enzymes: ?No results for input(s): CKTOTAL, CKMB, CKMBINDEX, TROPONINI in the last 168 hours. ?CBG: ?Recent Labs  ?Lab 11/15/21 ?2007 11/16/21 ?0148 11/16/21 ?0221 11/16/21 ?0630 11/16/21 ?0700  ?GLUCAP 166* 31* 79 51* 85  ? ? ?Iron Studies: No results for input(s): IRON, TIBC, TRANSFERRIN, FERRITIN in the last 72 hours. ?Lab Results  ?Component Value Date  ? INR 1.3 (H) 09/20/2021  ? INR 1.2 08/12/2021  ? INR 1.4 (H) 07/17/2021  ? ?Studies/Results: ?DG CHEST PORT 1 VIEW ? ?Result Date: 11/19/2021 ?CLINICAL DATA:  Hypoxia EXAM: PORTABLE CHEST 1 VIEW COMPARISON:  Previous studies including the examination of 11/16/2021 FINDINGS: Transverse diameter of heart is increased. Tip of dialysis catheter is seen in the superior vena cava. Central pulmonary vessels are prominent. Diffuse alveolar  densities seen in both lungs, more so on the right side. There is slight improvement in aeration in the left parahilar region. There is moderate right pleural effusion with interval increase. There is small left pleural effusion. There is no pneumothorax. IMPRESSION: Cardiomegaly. Pulmonary edema with possible slight improvement in the left parahilar region. There is moderate right pleural effusion with interval increase in size. Small left pleural effusion. Electronically Signed   By: Elmer Picker M.D.   On: 11/19/2021 12:21   ? ?Medications: ? ? apixaban  2.5 mg Oral BID  ? Chlorhexidine Gluconate Cloth  6 each Topical Q0600  ? diltiazem  300 mg Oral Daily  ? feeding supplement  237 mL Oral BID BM  ? sodium chloride flush  3 mL Intravenous Q12H  ? ?  ?

## 2021-11-20 NOTE — Progress Notes (Incomplete)
Patient refuse HD, pull his iv out and want to leave AMA. MD notified, Dr. Beaulah Dinning talk to the patient, pt. Insisting of leaving AMA. ?

## 2021-11-20 NOTE — Assessment & Plan Note (Addendum)
Patient was admitted to the cardiac ward and underwent renal replacement therapy with ultrafiltration for volume management.  ? ?08/2021 echocardiogram with preserved LV systolic function with EF 60 to 65% with severe left ventricular hypertrophy. Mild enlarged RV with severe elevation in pulmonary artery systolic pressure. Small pericardial effusion. Moderate TR. Small ascending aortic aneurysm.  ? ?He required consecutive ultrafiltration treatments, negative fluid balance was achieved with improvement in his symptoms and oxygen requirements.  ?Continue close follow up as outpatient.  ? ?

## 2021-11-20 NOTE — Assessment & Plan Note (Signed)
Continue with nutritional supplements.  

## 2021-11-20 NOTE — Progress Notes (Signed)
Pt complaining of chest pain in addition to back pain now 8/10. Will obtain EKG and page MD. ? ?MD promptly responded to page and endorsed she would come see pt. EKG negative for any overt changes. IV Team consulted to obtain PIV given chest pain and pt's request for IV pain medication. ?

## 2021-11-20 NOTE — Progress Notes (Signed)
Patient LW:[id number] Daijon Wenke      DOB: October 09, 1960      CZG:[id number] ? ? ? ?  ?Palliative Medicine Team ? ? ? ?Subjective: Bedside symptom check. ? ? ?Physical exam: Patient not available, in dialysis department at time of visit.  ? ? ?Assessment and plan: Will continue to follow along with his stay and provide any support needed.  ? ? ?Thank you for allowing the Palliative Medicine Team to assist in the care of this patient. ?  ?  ?Damian Leavell, MSN, RN ?Palliative Medicine Team ?Team Phone: [phone]  ?This phone is monitored 7a-7p, please reach out to attending physician outside of these hours for urgent needs.   ?

## 2021-11-21 ENCOUNTER — Inpatient Hospital Stay (HOSPITAL_COMMUNITY): Payer: Medicare Other

## 2021-11-21 DIAGNOSIS — J90 Pleural effusion, not elsewhere classified: Secondary | ICD-10-CM | POA: Diagnosis not present

## 2021-11-21 DIAGNOSIS — I480 Paroxysmal atrial fibrillation: Secondary | ICD-10-CM | POA: Diagnosis not present

## 2021-11-21 DIAGNOSIS — I5033 Acute on chronic diastolic (congestive) heart failure: Secondary | ICD-10-CM | POA: Diagnosis not present

## 2021-11-21 DIAGNOSIS — N186 End stage renal disease: Secondary | ICD-10-CM | POA: Diagnosis not present

## 2021-11-21 LAB — RENAL FUNCTION PANEL
Albumin: 2.9 g/dL — ABNORMAL LOW (ref 3.5–5.0)
Anion gap: 11 (ref 5–15)
BUN: 36 mg/dL — ABNORMAL HIGH (ref 8–23)
CO2: 27 mmol/L (ref 22–32)
Calcium: 9.8 mg/dL (ref 8.9–10.3)
Chloride: 100 mmol/L (ref 98–111)
Creatinine, Ser: 4.5 mg/dL — ABNORMAL HIGH (ref 0.61–1.24)
GFR, Estimated: 14 mL/min — ABNORMAL LOW (ref >60)
Glucose, Bld: 91 mg/dL (ref 70–99)
Phosphorus: 5.4 mg/dL — ABNORMAL HIGH (ref 2.5–4.6)
Potassium: 4.5 mmol/L (ref 3.5–5.1)
Sodium: 138 mmol/L (ref 135–145)

## 2021-11-21 MED ORDER — ORAL CARE MOUTH RINSE
15.0000 mL | Freq: Two times a day (BID) | OROMUCOSAL | Status: DC
  Administered 2021-11-21 – 2021-11-23 (×4): 15 mL via OROMUCOSAL

## 2021-11-21 MED ORDER — CALCIUM ACETATE (PHOS BINDER) 667 MG PO CAPS
667.0000 mg | ORAL_CAPSULE | Freq: Three times a day (TID) | ORAL | Status: DC
  Administered 2021-11-21 – 2021-11-23 (×4): 667 mg via ORAL
  Filled 2021-11-21 (×4): qty 1

## 2021-11-21 MED ORDER — HEPARIN SODIUM (PORCINE) 1000 UNIT/ML IJ SOLN
INTRAMUSCULAR | Status: AC
  Filled 2021-11-21: qty 3

## 2021-11-21 NOTE — Progress Notes (Addendum)
?Progress Note ? ? ?Patient: Thomas Robinson SWN:[id number] DOB: 03/18/1961 DOA: 11/15/2021     6 ?DOS: the patient was seen and examined on 11/21/2021 ?  ?Brief hospital course: ?Tanush Drees is a 61 y.o. male with medical history significant for ESRD on MWF HD, HFpEF (EF 60 to 65% by TTE 08/12/2021)chronic respiratory failure with hypoxia on 2-3 L O2 via [place], chronic pleural effusions, paroxysmal atrial fibrillation on Eliquis, anemia of chronic kidney disease, HTN, HLD, depression/anxiety who is admitted with acute on chronic hypoxic respiratory failure secondary to HFpEF and recurrent large right pleural effusion. ? ?At home patient had chest pressure and dyspnea. Had to increase with supplemental flow of 02, positive orthopnea and dyspnea on exertion. On his initial physical examination his blood pressure 181/100, RR 32, HR 78, temp 98.1 and 02 saturation 98% on 5 L/min per [place]. Lungs with decreased breath sounds with increased work of breathing, heart with S1 and S2 present and rhythmic, no gallops, abdomen soft and no lower extremity edema.  ? ?Na 138, K 5,3, CL 100, bicarbonate 24. Glucose 77, bun 40 and cr 5.72 ?BNP 2,899 ?High sensitive troponin 50 and 55  ?Wbc 3,6, hgb 10,0 hct 32.2 plt 136  ?Sars covid 19 negative  ? ?Chest radiograph with cardiomegaly with bilateral interstitial infiltrates more at the right lower lobe, positive loculated right pleural effusion.  ?Ct chest with right sided loculated pleural effusion, near complete right lower lobe compression atelectasis. Bilateral ground glass opacities. Stable aneurysmal dilatation of ascending aorta 4.3 cm.  ? ?EKG 93 bpm, right axis deviation, with normal intervals, sinus rhythm with LVH, no significant ST segment and T wave changes.  ? ?Patient was placed on on invasive mechanical ventilation and underwent hemodialysis with ultrafiltration. ?Right sided thoracentesis with 800 ml fluid removed.  ? ?He was transitioned to nasal cannula but continue  to have high oxygen requirements.  ? ?03/22 declining inpatient hemodialysis and willing to leave the hospital against medical advice.  ? ? ?Assessment and Plan: ?* Acute on chronic heart failure with preserved ejection fraction (HFpEF) ([place]) ?08/2021 echocardiogram with preserved LV systolic function with EF 60 to 65% with severe left ventricular hypertrophy. Mild enlarged RV with severe elevation in pulmonary artery systolic pressure. Small pericardial effusion. Moderate TR. Small ascending aortic aneurysm.  ? ?Patient had ultrafiltration this am with improvement in volume status but not back to baseline. ?Continue blood pressure monitoring.  ?Systolic this am 938 to 182 mmHg.  ? ?Acute on chronic hypoxemic respiratory failure due to volume overload and pulmonary hypertension.  ?Oxymetry today at 92% on 8 L/min per [place].  ? ? ? ? ?Pleural effusion, bilateral ?Patient had thoracentesis with 800 cc fluid removed. ?Transudate pleural effusion.  ?Continue oxymetry monitoring.  ? ?ESRD on dialysis on HD MWF-but noncompliant ?Hyperkalemia.  ?Today patient had hemodialysis with good toleration.  ?Documented ultrafiltration 2,500 cc with good toleration.  ?Plan for renal replacement therapy tomorrow, back on his usual schedule.  ?Hyperphosphatemia 5,4 will add phosphate binders.  ? ?Paroxysmal atrial fibrillation ([place]) ?On diltiazem for rate control and anticoagulation with apixaban.  ? ?Protein-calorie malnutrition, severe ([place]) ?Continue with nutritional supplements.  ? ? ? ? ?  ? ?Subjective: patient is feeling better, but not yet back to baseline, tolerated well HD this am.  ? ?Physical Exam: ?Vitals:  ? 11/21/21 1000 11/21/21 1030 11/21/21 1100 11/21/21 1145  ?BP: 114/68 (!) 160/46 (!) 123/59 (!) 104/42  ?Pulse: 72 (!) 50 (!) 57 73  ?  Resp: (!) 23 20 (!) 24 (!) 25  ?Temp:    97.7 ?F (36.5 ?C)  ?TempSrc:      ?SpO2:      ?Weight:      ?Height:      ? ?Neurology awake and alert, deconditioned ?ENT with mild  pallor ?Cardiovascular with S1 and S2 present, positive systolic murmur at the right sternal border, no gallop ?No JVD ?No lower extremity edema ?Respiratory with scattered rales but no wheezing  ?Abdomen soft and non tender  ?Data Reviewed: ? ? ? ?Family Communication: no family at the bedside  ? ?Disposition: ?Status is: Inpatient ?Remains inpatient appropriate because: volume overload  ? Planned Discharge Destination: Home ? ? ? ?Author: ?Tawni Millers, MD ?11/21/2021 1:22 PM ? ?For on call review [email or web address].  ?

## 2021-11-21 NOTE — TOC Transition Note (Signed)
Transition of Care (TOC) - CM/SW Discharge Note ? ? ?Patient Details  ?Name: Thomas Robinson ?MRN: [id number] ?Date of Birth: [birth year] ? ?Transition of Care (TOC) CM/SW Contact:  ?Zenon Mayo, RN ?Phone Number: ?11/21/2021, 12:10 PM ? ? ?Clinical Narrative:    ?Patient is active with [facility] for HHPT, NCM notified Amy with [facility] of poss dc today.  He is also set up with outpatient palliative services with [facility], NCM notified [facility] rep Roselee Nova.  Also he has home oxygen with [employer] 3 to 7 liters per patient , he also has a walker and w/chair at home.  Has friend [name] who will transport him home at discharge per patient, but he does not want this NCM to call her, he states she is on the do not call list for now.  He  will be getting HD today, and then will likely be dc home per Staff RN.  ? ? ?Final next level of care: [employer] ?Barriers to Discharge: Continued Medical Work up ? ? ?Patient Goals and CMS Choice ?Patient states their goals for this hospitalization and ongoing recovery are:: return home and resume [place] services ?CMS Medicare.gov Compare Post Acute Care list provided to:: Patient ?Choice offered to / list presented to : Patient ? ?Discharge Placement ?  ?           ?  ?  ?  ?  ? ?Discharge Plan and Services ?In-house Referral: NA ?Discharge Planning Services: CM Consult ?Post Acute Care Choice: Resumption of Svcs/PTA Provider, Home Health          ?  ?DME Agency: NA ?  ?  ?  ?HH Arranged: RN ?[place] Agency: [place] ?Date HH Agency Contacted: 11/20/21 ?Time [place]: 9211 ?Representative spoke with at [place]: [place] ? ?Social Determinants of Health (SDOH) Interventions ?  ? ? ?Readmission Risk Interventions ? ?  11/20/2021  ? 11:09 AM 09/08/2021  ?  2:46 PM 01/15/2021  ?  2:13 PM  ?Readmission Risk Prevention Plan  ?Transportation Screening Complete Complete Complete  ?Medication Review Press photographer) Complete Complete Complete   ?PCP or Specialist appointment within 3-5 days of discharge Complete    ?[place] or Home Care Consult Complete Complete Complete  ?SW Recovery Care/Counseling Consult Complete Complete Complete  ?Palliative Care Screening Complete Not Applicable Not Applicable  ?[place] Not Applicable Not Applicable Not Applicable  ? ? ? ? ? ?

## 2021-11-21 NOTE — Progress Notes (Signed)
Pt transported off unit to dialysis. P. Amo Coda Filler RN 

## 2021-11-21 NOTE — Progress Notes (Signed)
?[facility] ?Progress Note  ? ?Subjective:    ?Had very difficult time in HD yest w/ resp distress requiring NRG/ high flow O2, also had BP drop into the 70s. UF was 600 cc.  Back today looks better w/ stable BP's > 100 and no resp distress. Net UF was 2.5.  ? ?Objective ?Vitals:  ? 11/21/21 1000 11/21/21 1030 11/21/21 1100 11/21/21 1145  ?BP: 114/68 (!) 160/46 (!) 123/59 (!) 104/42  ?Pulse: 72 (!) 50 (!) 57 73  ?Resp: (!) 23 20 (!) 24 (!) 25  ?Temp:    97.7 ?F (36.5 ?C)  ?TempSrc:      ?SpO2:      ?Weight:      ?Height:      ? ?Physical Exam ?General: Chronically-ill appearing; awake and alert ?Heart: S1 and S2 ?Lungs: Clear anteriorly; diminished laterally ?Abdomen: soft and non-tender ?Extremities: No edema BLLE ?Dialysis Access: RIJ TDC; R BBF (+) thrill  ? ? ? ?Dialysis Orders: [facility], MWF ? 3.5h   400/800   2/2.5 bath  P2  45.5kg  RIJ TDC   Hep none ? - on 3/17 > hep B Ag was neg, and hep B Ab's were low/ not protective ?- mircera 117mg q2weeks (last dose: 3/16) ? ?Assessment/Plan: ?1) ESRD:  hx noncompliance on not staying for full sessions. HD yest w/ ineffective for UF due to resp distress and hypotension. HD doing better in HD, 2.5 L removed and Bp's were more stable. Plan HD tomorrow to get back on schedule.  ?  ?2) Anemia of Chronic Kidney Disease: Hemoglobin 10. Last ESA given 3/16, not due til 3/30.  ?  ?3) Volume/ hypertension: attempt to achieve EDW as tolerated. S/p thoracentesis in IR 3/18-removed 800cc cloudy/rust color fluid. Should be about 2-3kg up today after 2.5 L UF this am in HD.  ?  ?4)  Secondary Hyperparathyroidism/Hyperphosphatemia: PO4 high, resume home binders ?  ?5)  Pleural effusion: S/p thora 3/18-please see above ?  ?6)  [facility]-planning on doing AVG as an outpatient (last seen in Nov 2022) ?  ?7)  afib -  on eliquis, Cardizem CD ?  ?8)  h/o  rib fractures ?-per PMD ? ? ?RKelly Splinter MD ?11/19/2021, 4:19 PM ? ?Filed Weights  ?  11/19/21 0400 11/20/21 0438 11/20/21 1320  ?Weight: 50.3 kg 48.1 kg 49.1 kg  ? ? ?Intake/Output Summary (Last 24 hours) at 11/21/2021 1245 ?Last data filed at 11/21/2021 1145 ?Gross per 24 hour  ?Intake 600 ml  ?Output 3100 ml  ?Net -2500 ml  ? ? ? ?Additional Objective ?Labs: ?Basic Metabolic Panel: ?Recent Labs  ?Lab 11/15/21 ?1829 11/18/21 ?0767203/22/23 ?1220 11/21/21 ?00947 ?NA  --  137 135 138  ?K  --  6.1* 5.4* 4.5  ?CL  --  97* 96* 100  ?CO2  --  '26 26 27  '$ ?GLUCOSE  --  89 123* 91  ?BUN  --  48* 60* 36*  ?CREATININE  --  6.95* 7.00* 4.50*  ?CALCIUM  --  9.7 10.3 9.8  ?PHOS 7.4*  --   --  5.4*  ? ? ?Liver Function Tests: ?Recent Labs  ?Lab 11/15/21 ?1642 11/21/21 ?00962 ?AST 20  --   ?ALT 10  --   ?ALKPHOS 101  --   ?BILITOT 1.5*  --   ?PROT 7.9  --   ?ALBUMIN 3.7 2.9*  ? ? ?No results for input(s): LIPASE, AMYLASE in the last  168 hours. ?CBC: ?Recent Labs  ?Lab 11/15/21 ?1642 11/18/21 ?0832 11/20/21 ?1220  ?WBC 3.6* 6.3 5.7  ?NEUTROABS 2.6  --   --   ?HGB 10.0* 7.8* 7.5*  ?HCT 32.2* 24.9* 23.5*  ?MCV 102.2* 103.3* 99.2  ?PLT 136* 165 198  ? ? ?Blood Culture ?   ?Component Value Date/Time  ? SDES BLOOD BLOOD LEFT FOREARM 09/19/2021 1443  ? SPECREQUEST  09/19/2021 1443  ?  BOTTLES DRAWN AEROBIC AND ANAEROBIC Blood Culture adequate volume  ? CULT  09/19/2021 1443  ?  NO GROWTH 5 DAYS ?Performed at [facility], [place] [address] [place], [place] [address] ?  ? REPTSTATUS 09/24/2021 FINAL 09/19/2021 1443  ? ? ?Cardiac Enzymes: ?No results for input(s): CKTOTAL, CKMB, CKMBINDEX, TROPONINI in the last 168 hours. ?CBG: ?Recent Labs  ?Lab 11/15/21 ?2007 11/16/21 ?0148 11/16/21 ?0221 11/16/21 ?0630 11/16/21 ?0700  ?GLUCAP 166* 31* 79 51* 85  ? ? ?Iron Studies: No results for input(s): IRON, TIBC, TRANSFERRIN, FERRITIN in the last 72 hours. ?Lab Results  ?Component Value Date  ? INR 1.3 (H) 09/20/2021  ? INR 1.2 08/12/2021  ? INR 1.4 (H) 07/17/2021  ? ?Studies/Results: ?No results found. ? ?Medications: ? ? apixaban   2.5 mg Oral BID  ? Chlorhexidine Gluconate Cloth  6 each Topical Q0600  ? diltiazem  300 mg Oral Daily  ? feeding supplement  237 mL Oral BID BM  ? heparin sodium (porcine)      ? mouth rinse  15 mL Mouth Rinse BID  ? sodium chloride flush  3 mL Intravenous Q12H  ? ?  ?

## 2021-11-22 DIAGNOSIS — N186 End stage renal disease: Secondary | ICD-10-CM | POA: Diagnosis not present

## 2021-11-22 DIAGNOSIS — J90 Pleural effusion, not elsewhere classified: Secondary | ICD-10-CM | POA: Diagnosis not present

## 2021-11-22 DIAGNOSIS — I480 Paroxysmal atrial fibrillation: Secondary | ICD-10-CM | POA: Diagnosis not present

## 2021-11-22 DIAGNOSIS — Z7189 Other specified counseling: Secondary | ICD-10-CM

## 2021-11-22 DIAGNOSIS — I5033 Acute on chronic diastolic (congestive) heart failure: Secondary | ICD-10-CM | POA: Diagnosis not present

## 2021-11-22 LAB — RENAL FUNCTION PANEL
Albumin: 2.7 g/dL — ABNORMAL LOW (ref 3.5–5.0)
Anion gap: 12 (ref 5–15)
BUN: 26 mg/dL — ABNORMAL HIGH (ref 8–23)
CO2: 27 mmol/L (ref 22–32)
Calcium: 10.3 mg/dL (ref 8.9–10.3)
Chloride: 100 mmol/L (ref 98–111)
Creatinine, Ser: 3.64 mg/dL — ABNORMAL HIGH (ref 0.61–1.24)
GFR, Estimated: 18 mL/min — ABNORMAL LOW (ref >60)
Glucose, Bld: 81 mg/dL (ref 70–99)
Phosphorus: 5.5 mg/dL — ABNORMAL HIGH (ref 2.5–4.6)
Potassium: 3.9 mmol/L (ref 3.5–5.1)
Sodium: 139 mmol/L (ref 135–145)

## 2021-11-22 LAB — CBC
HCT: 24.6 % — ABNORMAL LOW (ref 39.0–52.0)
Hemoglobin: 7.9 g/dL — ABNORMAL LOW (ref 13.0–17.0)
MCH: 32 pg (ref 26.0–34.0)
MCHC: 32.1 g/dL (ref 30.0–36.0)
MCV: 99.6 fL (ref 80.0–100.0)
Platelets: 191 10*3/uL (ref 150–400)
RBC: 2.47 MIL/uL — ABNORMAL LOW (ref 4.22–5.81)
RDW: 15.6 % — ABNORMAL HIGH (ref 11.5–15.5)
WBC: 7.6 10*3/uL (ref 4.0–10.5)
nRBC: 0 % (ref 0.0–0.2)

## 2021-11-22 MED ORDER — LORAZEPAM 1 MG PO TABS
1.0000 mg | ORAL_TABLET | ORAL | Status: AC
  Administered 2021-11-22: 1 mg via ORAL
  Filled 2021-11-22: qty 1

## 2021-11-22 MED ORDER — BISACODYL 10 MG RE SUPP
10.0000 mg | Freq: Once | RECTAL | Status: AC
  Administered 2021-11-22: 10 mg via RECTAL
  Filled 2021-11-22: qty 1

## 2021-11-22 MED ORDER — HEPARIN SODIUM (PORCINE) 1000 UNIT/ML IJ SOLN
INTRAMUSCULAR | Status: AC
  Filled 2021-11-22: qty 4

## 2021-11-22 NOTE — Progress Notes (Signed)
?Progress Note ? ? ?Patient: Thomas Robinson PIR:[id number] DOB: 18-Apr-1961 DOA: 11/15/2021     7 ?DOS: the patient was seen and examined on 11/22/2021 ?  ?Brief hospital course: ?Thomas Robinson is a 61 y.o. male with medical history significant for ESRD on MWF HD, HFpEF (EF 60 to 65% by TTE 08/12/2021)chronic respiratory failure with hypoxia on 2-3 L O2 via [place], chronic pleural effusions, paroxysmal atrial fibrillation on Eliquis, anemia of chronic kidney disease, HTN, HLD, depression/anxiety who is admitted with acute on chronic hypoxic respiratory failure secondary to HFpEF and recurrent large right pleural effusion. ? ?At home patient had chest pressure and dyspnea. Had to increase with supplemental flow of 02, positive orthopnea and dyspnea on exertion. On his initial physical examination his blood pressure 181/100, RR 32, HR 78, temp 98.1 and 02 saturation 98% on 5 L/min per [place]. Lungs with decreased breath sounds with increased work of breathing, heart with S1 and S2 present and rhythmic, no gallops, abdomen soft and no lower extremity edema.  ? ?Na 138, K 5,3, CL 100, bicarbonate 24. Glucose 77, bun 40 and cr 5.72 ?BNP 2,899 ?High sensitive troponin 50 and 55  ?Wbc 3,6, hgb 10,0 hct 32.2 plt 136  ?Sars covid 19 negative  ? ?Chest radiograph with cardiomegaly with bilateral interstitial infiltrates more at the right lower lobe, positive loculated right pleural effusion.  ?Ct chest with right sided loculated pleural effusion, near complete right lower lobe compression atelectasis. Bilateral ground glass opacities. Stable aneurysmal dilatation of ascending aorta 4.3 cm.  ? ?EKG 93 bpm, right axis deviation, with normal intervals, sinus rhythm with LVH, no significant ST segment and T wave changes.  ? ?Patient was placed on on invasive mechanical ventilation and underwent hemodialysis with ultrafiltration. ?Right sided thoracentesis with 800 ml fluid removed.  ? ?He was transitioned to nasal cannula but continue  to have high oxygen requirements.  ? ?03/22 declining inpatient hemodialysis and willing to leave the hospital against medical advice.  ? ? ?Assessment and Plan: ?* Acute on chronic heart failure with preserved ejection fraction (HFpEF) ([place]) ?08/2021 echocardiogram with preserved LV systolic function with EF 60 to 65% with severe left ventricular hypertrophy. Mild enlarged RV with severe elevation in pulmonary artery systolic pressure. Small pericardial effusion. Moderate TR. Small ascending aortic aneurysm.  ? ?Continue to improve volume status after ultrafiltration. ?This am had 2,500 ml removed with improvement in oxygenation.  ? ?Acute on chronic hypoxemic respiratory failure due to volume overload and pulmonary hypertension.  ?Will wean off supplemental 02 as tolerated, this am on 11 L per min per HFNC, will reduce to 8 and continue to wean down to 5 as tolerated.  ? ? ? ?Pleural effusion, bilateral ?Patient had thoracentesis with 800 cc fluid removed. ?Transudate pleural effusion.  ?Continue oxymetry monitoring.  ?Improved oxygenation after ultrafiltration  ? ?ESRD on dialysis on HD MWF-but noncompliant ?Hyperkalemia, hyperphosphatemia.  ? ?Patient had 3 consecutive days of hemodialysis, this am is feeling better with improved dyspnea. [name] ?Plan to continue wean supplemental 02 per [place], this am was on 11 L/min per [place] ?[place] to reduce supplemental 02 to 5 to 6 L per regular [place].  ? ?Plan for HD on 03/27.  ?Continue with phoslo for hyperphosphatemia.  ?Continue EPO for anemia of chronic disease.  ? ? ?Paroxysmal atrial fibrillation (HCC) ?Continue with diltiazem for rate control and anticoagulation with apixaban.  ? ?Protein-calorie malnutrition, severe ([place]) ?Continue with nutritional supplements.  ? ? ? ? ?  ? ?  Subjective: patient with improvement in dyspnea, after HD, continue to have high oxygen requirements.,  ? ?Physical Exam: ?Vitals:  ? 11/22/21 0930 11/22/21 1000 11/22/21 1030 11/22/21 1121  ?BP: (!)  143/109 (!) 141/119 (!) 137/96   ?Pulse: (!) 51 (!) 54 (!) 55 (!) 102  ?Resp:    20  ?Temp:      ?TempSrc:      ?SpO2:    90%  ?Weight:      ?Height:      ? ?Neurology awake and alert ?ENT with pallor ?Cardiovascular with S1 and S2 present and rhythmic with no gallops . Positive systolic murmurs ?No JVD ?No  lower extremity edema ?Respiratory with scattered rales  ?Abdomen soft ?Data Reviewed: ? ? ? ?Family Communication: I was not able to reach Mrs. [name], not able to leave messages  ? ?Disposition: ?Status is: Inpatient ?Remains inpatient appropriate because: increase oxygen requirements  ? Planned Discharge Destination: Home ? ? ? ? ? ?Author: ?Tawni Millers, MD ?11/22/2021 1:27 PM ? ?For on call review [email or web address].  ?

## 2021-11-22 NOTE — Progress Notes (Signed)
Patient has been aggressive and rude to nursing staff. Patient calls the nursing station and yells when requesting nursing staff. RN went to patient room every time he called. Patient was unable to address his needs and continues to request for management team. Charge nurse informed.  ?

## 2021-11-22 NOTE — Progress Notes (Signed)
Rounded on patient to find him without oxygen. Patient had previously removed SpO2 probe; o2 sat in high 60s on replacement. Hyperoxygenated at 12L and then titrated back down to 5L. Patient endorses loss of consciousness; would not answer orientation questions, fixating instead of next dose of dilaudid. Dilaudid not given at this time. ?

## 2021-11-22 NOTE — Progress Notes (Signed)
Patient states that he is unhappy with the nursing staff on this unit. Dannielle Karvonen came and talk to patient.  ? ? ?

## 2021-11-22 NOTE — Progress Notes (Signed)
? ?Palliative Medicine Inpatient Follow Up Note ? ? ?HPI: ?60/M with history of ESRD on HD MWF, chronic diastolic CHF, chronic respiratory failure on 2 to 3 L O2, chronic pleural effusions, paroxysmal A-fib on Eliquis, anemia of chronic disease, hypertension, depression/anxiety presented to the ED with chest discomfort and dyspnea. ?  ?Palliative care has seen Thomas Robinson multiple times in the past - he is noted to be noncompliant with HD sessions therefore we were consulted to further address goals of care. ? ?Today's Discussion (11/22/2021) ? ?*Please note that this is a verbal dictation therefore any spelling or grammatical errors are due to the "[employer]" system interpretation. ? ?Chart reviewed inclusive of vital signs, progress notes, laboratory results, and diagnostic images.  ? ?Patient O2 needs continue to increase daily, he is now on 12LPM of HFNC. He has been more compliant with HD.  ? ?Upon AM rounds patient noted to be at HD. ?_____________________________________________________ ?Addendum: ? ?I met with Thomas Robinson this afternoon. He was resting in bed in NAD. We discussed his current clinical scenario in terms of his dyspnea. He shares that is it now "better" reviewed that he is now back down to 5LPM [place]. ? ?I asked Thomas Robinson how he is feeling overall. He shares some brief concerns about when he will leave. He states that he is willing to stay in the hospital another night if he may leave tomorrow.  ? ?Discussed the care Thomas Robinson will receive at home. He tells me an MD told him that "he would die soon". I asked Thomas Robinson if he feels that his time on earth will be short and he responded, "I don't know." I shared my concerns based upon is frail state and multitude of chronic medical conditions.  ? ?Thomas Robinson continued to perseverate on the discharge plan and calling Edmonia Lynch to verify she can take him home tomorrow. I shared that the [facility] team would make those arrangements.  ? ?Otherwise, Thomas Robinson dissatisfied with the  nursing staff. He shares that he has already addressed his concerns with the management staff. ? ?Questions and concerns addressed  ? ?Palliative Support Provided ? ?Objective Assessment: ?Vital Signs ?Vitals:  ? 11/21/21 2131 11/21/21 2334  ?BP: 101/62 (!) 98/51  ?Pulse: 81   ?Resp: 19 20  ?Temp:  97.8 ?F (36.6 ?C)  ?SpO2: 96% 97%  ? ? ?Intake/Output Summary (Last 24 hours) at 11/22/2021 0654 ?Last data filed at 11/22/2021 0243 ?Gross per 24 hour  ?Intake 243 ml  ?Output 2500 ml  ?Net -2257 ml  ? ?Last Weight  Most recent update: 11/22/2021  1:53 AM  ? ? Weight  ?45.6 kg (100 lb 8.5 oz)  ?      ? ?  ? ?Gen: Frail middle-aged male in no acute distress ?HEENT: moist mucous membranes ?CV: Regular rate and irregular rhythm ?PULM: On 5L of oxygen breathing is even and nonlabored ?ABD: soft/nontender  ?EXT: Cachexia. Notable muscle wasting in all 4 extremities ?Neuro: Alert and oriented x3 ? ?SUMMARY OF RECOMMENDATIONS   ?DNAR/DNI ?  ?MOST and Advance Directives can be found in [place] ?  ?TOC - OP Palliative Support ?  ?Ongoing support incrementally ? ?MDM - High ?______________________________________________________________________________________ ?Tacey Ruiz ?[employer] Team ?Team Cell Phone: [phone] ?Please utilize secure chat with additional questions, if there is no response within 30 minutes please call the above phone number ? ?Palliative Medicine Team providers are available by phone from 7am to 7pm daily and can be reached through the team cell phone.  ?  Should this patient require assistance outside of these hours, please call the patient's attending physician. ? ? ? ? ?

## 2021-11-22 NOTE — Progress Notes (Signed)
?[facility] ?Progress Note  ? ?Subjective:   had one BP drop in HD today, 2 L net UF completed.  ? ?Objective ?Vitals:  ? 11/22/21 0930 11/22/21 1000 11/22/21 1030 11/22/21 1121  ?BP: (!) 143/109 (!) 141/119 (!) 137/96   ?Pulse: (!) 51 (!) 54 (!) 55 (!) 102  ?Resp:    20  ?Temp:      ?TempSrc:      ?SpO2:    90%  ?Weight:      ?Height:      ? ?Physical Exam ?General: Chronically-ill appearing; awake and alert ?Heart: S1 and S2 ?Lungs: Clear anteriorly; diminished laterally, some rales L base ?Abdomen: soft and non-tender ?Extremities: No edema BLLE ?Dialysis Access: RIJ TDC; R BBF (+) thrill  ? ? ? ?Dialysis Orders: [facility], MWF ? 3.5h   400/800   2/2.5 bath  P2  45.5kg  RIJ TDC   Hep none ? - on 3/17 > hep B Ag was neg, and hep B Ab's were low/ not protective ?- mircera 1103mg q2weeks (last dose: 3/16) ? ?Assessment/Plan: ?1) ESRD: on HD MWF. With hx noncompliance not staying for full sessions. Required 3 HD in a row including today, to get his resp status in better shape.  Got 2 L off today. Next HD Monday. Pt is declining over the last 3-6 mos and cannot make it more than a couple weeks out of the hospital now. I shared my concerns with him, but he did not want to discuss his prognosis.  ?  ?2) Anemia of Chronic Kidney Disease: Hemoglobin 10. Last ESA given 3/16, not due til 3/30.  ?  ?3) Volume/ resp distress : s/p thoracentesis in IR 3/18-removed 800cc cloudy/rust color fluid. Had resp distress mid-week this week requiring NRB mask and HFNC up to 15 lpm. Is on 6 L O2 at home. Resp status more stable now after back to back HD sessions.  ?  ?4)  Secondary Hyperparathyroidism/Hyperphosphatemia: PO4 high, resume home binders ?  ?5)  Pleural effusion: S/p thora 3/18-please see above ?  ?6)  [facility]-planning on doing AVG as an outpatient (last seen in Nov 2022) ?  ?7)  afib -  on eliquis, Cardizem CD ?  ?8)  h/o  rib fractures ?-per PMD ? ?9) Dispo - per primary team. OK  for dc from renal standpoint.  ? ? ?RKelly Splinter MD ?11/19/2021, 4:19 PM ? ?Filed Weights  ? 11/20/21 1320 11/22/21 0153 11/22/21 0702  ?Weight: 49.1 kg 45.6 kg 47.8 kg  ? ? ?Intake/Output Summary (Last 24 hours) at 11/22/2021 1220 ?Last data filed at 11/22/2021 0243 ?Gross per 24 hour  ?Intake 243 ml  ?Output --  ?Net 243 ml  ? ? ? ?Additional Objective ?Labs: ?Basic Metabolic Panel: ?Recent Labs  ?Lab 11/15/21 ?1829 11/18/21 ?0465603/22/23 ?1220 11/21/21 ?0812703/24/23 ?05170 ?NA  --    < > 135 138 139  ?K  --    < > 5.4* 4.5 3.9  ?CL  --    < > 96* 100 100  ?CO2  --    < > '26 27 27  '$ ?GLUCOSE  --    < > 123* 91 81  ?BUN  --    < > 60* 36* 26*  ?CREATININE  --    < > 7.00* 4.50* 3.64*  ?CALCIUM  --    < > 10.3 9.8 10.3  ?PHOS 7.4*  --   --  5.4*  5.5*  ? < > = values in this interval not displayed.  ? ? ?Liver Function Tests: ?Recent Labs  ?Lab 11/15/21 ?1642 11/21/21 ?3338 11/22/21 ?3291  ?AST 20  --   --   ?ALT 10  --   --   ?ALKPHOS 101  --   --   ?BILITOT 1.5*  --   --   ?PROT 7.9  --   --   ?ALBUMIN 3.7 2.9* 2.7*  ? ? ?No results for input(s): LIPASE, AMYLASE in the last 168 hours. ?CBC: ?Recent Labs  ?Lab 11/15/21 ?1642 11/18/21 ?9166 11/20/21 ?1220 11/22/21 ?0600  ?WBC 3.6* 6.3 5.7 7.6  ?NEUTROABS 2.6  --   --   --   ?HGB 10.0* 7.8* 7.5* 7.9*  ?HCT 32.2* 24.9* 23.5* 24.6*  ?MCV 102.2* 103.3* 99.2 99.6  ?PLT 136* 165 198 191  ? ? ?Blood Culture ?   ?Component Value Date/Time  ? SDES BLOOD BLOOD LEFT FOREARM 09/19/2021 1443  ? SPECREQUEST  09/19/2021 1443  ?  BOTTLES DRAWN AEROBIC AND ANAEROBIC Blood Culture adequate volume  ? CULT  09/19/2021 1443  ?  NO GROWTH 5 DAYS ?Performed at [facility], [place] [address] [place], [place] [address] ?  ? REPTSTATUS 09/24/2021 FINAL 09/19/2021 1443  ? ? ?Cardiac Enzymes: ?No results for input(s): CKTOTAL, CKMB, CKMBINDEX, TROPONINI in the last 168 hours. ?CBG: ?Recent Labs  ?Lab 11/15/21 ?2007 11/16/21 ?0148 11/16/21 ?0221 11/16/21 ?0630 11/16/21 ?0700  ?GLUCAP 166* 31*  79 51* 85  ? ? ?Iron Studies: No results for input(s): IRON, TIBC, TRANSFERRIN, FERRITIN in the last 72 hours. ?Lab Results  ?Component Value Date  ? INR 1.3 (H) 09/20/2021  ? INR 1.2 08/12/2021  ? INR 1.4 (H) 07/17/2021  ? ?Studies/Results: ?DG CHEST PORT 1 VIEW ? ?Result Date: 11/21/2021 ?CLINICAL DATA:  Shortness of breath EXAM: PORTABLE CHEST 1 VIEW COMPARISON:  November 19, 2021 FINDINGS: Right upper central venous catheter with tip overlying the right atrium. Cardiac silhouette is partially obscured but appears enlarged, unchanged. Central vascular prominence. Increased large right pleural effusion with adjacent atelectasis. Bilateral interstitial alveolar opacities appears similar prior. The visualized skeletal structures are unchanged. Cervical fusion hardware. IMPRESSION: 1. Increased large right pleural effusion with adjacent atelectasis. 2. Persistent bilateral interstitial and alveolar opacities, likely pulmonary edema. Electronically Signed   By: Dahlia Bailiff M.D.   On: 11/21/2021 15:29   ? ?Medications: ? ? apixaban  2.5 mg Oral BID  ? calcium acetate  667 mg Oral TID WC  ? Chlorhexidine Gluconate Cloth  6 each Topical Q0600  ? diltiazem  300 mg Oral Daily  ? feeding supplement  237 mL Oral BID BM  ? heparin sodium (porcine)      ? mouth rinse  15 mL Mouth Rinse BID  ? sodium chloride flush  3 mL Intravenous Q12H  ? ?  ?

## 2021-11-22 NOTE — Progress Notes (Signed)
removed 204ms net fluid. unable to remove more due to tachycardia and hypotension below 85 systolic.   pre bp 1770/34post 81/56 pre weight 47.8kg post weight 45.5kg complained of anxiety and pain during tx gave ativan and oxycodone as ordered.  cath ran well packed with heparin clamped and capped ?

## 2021-11-23 MED ORDER — APIXABAN 2.5 MG PO TABS: 2.5000 mg | ORAL_TABLET | Freq: Two times a day (BID) | ORAL | 0 refills | Status: AC

## 2021-11-23 NOTE — Discharge Summary (Signed)
?Physician Discharge Summary ?  ?Patient: Thomas Robinson MRN: [id number] DOB: 04-24-1961  ?Admit date:     11/15/2021  ?Discharge date: 11/23/21  ?Discharge Physician: Jimmy Picket Jayda White  ? ?PCP: [name], PA  ? ?Recommendations at discharge:  ? ? Patient will need close follow up as outpatient ?Continue supplemental 02 per [place] to keep 02 saturation 88% or greater.  ?Advised about compliance with hemodialysis  ? ?Discharge Diagnoses: ?Principal Problem: ?  Acute on chronic heart failure with preserved ejection fraction (HFpEF) ([place]) ?Active Problems: ?  Pleural effusion, bilateral ?  ESRD on dialysis on HD MWF-but noncompliant ?  Paroxysmal atrial fibrillation (HCC) ?  Protein-calorie malnutrition, severe ([place]) ? ?Resolved Problems: ?  * No resolved hospital problems. * ? ?Hospital Course: ?Thomas Robinson was admitted to the hospital with the working diagnosis of volume overload in the setting of ESRD on hemodialysis.  ? ?Thomas Robinson is a 61 y.o. male with medical history significant for ESRD on MWF HD, HFpEF (EF 60 to 65% by TTE 08/12/2021)chronic respiratory failure with hypoxia on 5 to 6  L O2 via [place], chronic pleural effusions, paroxysmal atrial fibrillation on Eliquis, anemia of chronic kidney disease, HTN, HLD, depression/anxiety who is admitted with acute on chronic hypoxic respiratory failure secondary to HFpEF and recurrent large right pleural effusion. ? ?At home patient had chest pressure and dyspnea. Had to increase with supplemental flow of 02, positive orthopnea and dyspnea on exertion. On his initial physical examination his blood pressure 181/100, RR 32, HR 78, temp 98.1 and 02 saturation 98% on supplemental 02. Lungs with decreased breath sounds with increased work of breathing, heart with S1 and S2 present and rhythmic, no gallops, abdomen soft and no lower extremity edema.  ? ?Na 138, K 5,3, CL 100, bicarbonate 24. Glucose 77, bun 40 and cr 5.72 ?BNP 2,899 ?High sensitive troponin 50 and  55  ?Wbc 3,6, hgb 10,0 hct 32.2 plt 136  ?Sars covid 19 negative  ? ?Chest radiograph with cardiomegaly with bilateral interstitial infiltrates more at the right lower lobe, positive loculated right pleural effusion.  ?Ct chest with right sided loculated pleural effusion, near complete right lower lobe compression atelectasis. Bilateral ground glass opacities. Stable aneurysmal dilatation of ascending aorta 4.3 cm.  ? ?EKG 93 bpm, right axis deviation, with normal intervals, sinus rhythm with LVH, no significant ST segment and T wave changes.  ? ?Patient was placed on on invasive mechanical ventilation and underwent hemodialysis with ultrafiltration. ?Right sided thoracentesis with 800 ml fluid removed.  ? ?He was transitioned to nasal cannula but continue to have high oxygen requirements.  ? ?03/22 declining inpatient hemodialysis and willing to leave the hospital against medical advice.  ? ?He agreed to have renal renal replacement therapy. He had HD on 3 consecutive days with improvement in volume status. ?Decreased 02 requirements to 5 L.  ?Plan to follow up as outpatient.  ? ? ?Assessment and Plan: ?* Acute on chronic heart failure with preserved ejection fraction (HFpEF) ([place]) ?Patient was admitted to the cardiac ward and underwent renal replacement therapy with ultrafiltration for volume management.  ? ?08/2021 echocardiogram with preserved LV systolic function with EF 60 to 65% with severe left ventricular hypertrophy. Mild enlarged RV with severe elevation in pulmonary artery systolic pressure. Small pericardial effusion. Moderate TR. Small ascending aortic aneurysm.  ? ?He required consecutive ultrafiltration treatments, negative fluid balance was achieved with improvement in his symptoms and oxygen requirements.  ?Continue close follow up as  outpatient.  ? ? ?Pleural effusion, bilateral ?Patient had thoracentesis with 800 cc fluid removed. ?Transudate pleural effusion.  ?Continue oxymetry monitoring.   ?Improved oxygenation after ultrafiltration  ? ?ESRD on dialysis on HD MWF-but noncompliant ?Hyperkalemia, hyperphosphatemia.  ? ?Patient had 3 consecutive days of hemodialysis, with improvement of his hypervolemia. ?  ?For metabolic bone disease patient will continue with phosphate binders for hyperphosphatemia.  ?Continue EPO for anemia of chronic disease.  ?His discharge hgb is 7.9 with hct at 24.6  ? ? ?Paroxysmal atrial fibrillation (HCC) ?Patient remained rate controlled with diltiazem Continue anticoagulation with apixaban with good toleration.   ? ?Protein-calorie malnutrition, severe ([place]) ?Continue with nutritional supplements.  ? ? ? ? ?  ? ? ?Consultants: nephrology  ?Procedures performed: none   ?Disposition: Home ?Diet recommendation:  ?Discharge Diet Orders (From admission, onward)  ? ?  Start     Ordered  ? 11/23/21 0000  Diet - low sodium heart healthy       ? 11/23/21 1051  ? ?  ?  ? ?  ? ?Cardiac diet ?DISCHARGE MEDICATION: ?Allergies as of 11/23/2021   ? ?   Reactions  ? Vancomycin Shortness Of Breath, Itching  ? ?  ? ?  ?Medication List  ?  ? ?STOP taking these medications   ? ?sertraline 25 MG tablet ?Commonly known as: ZOLOFT ?  ? ?  ? ?TAKE these medications   ? ?acetaminophen 500 MG tablet ?Commonly known as: TYLENOL ?Take 500 mg by mouth every 6 (six) hours as needed for moderate pain (pain). ?  ?albuterol 108 (90 Base) MCG/ACT inhaler ?Commonly known as: VENTOLIN HFA ?Inhale 2 puffs into the lungs 2 (two) times daily as needed for shortness of breath or wheezing. ?  ?apixaban 2.5 MG Tabs tablet ?Commonly known as: ELIQUIS ?Take 1 tablet (2.5 mg total) by mouth 2 (two) times daily. ?  ?cinacalcet 60 MG tablet ?Commonly known as: SENSIPAR ?Take 1 tablet (60 mg total) by mouth every evening. ?  ?diltiazem 300 MG 24 hr capsule ?Commonly known as: CARDIZEM CD ?Take 1 capsule (300 mg total) by mouth daily. ?What changed: when to take this ?  ?feeding supplement Liqd ?Take 237 mLs by mouth 2  (two) times daily between meals. ?What changed: when to take this ?  ?fluticasone furoate-vilanterol 100-25 MCG/ACT Aepb ?Commonly known as: BREO ELLIPTA ?Inhale 1 puff into the lungs daily as needed (For shortness of breath). ?  ?midodrine 10 MG tablet ?Commonly known as: PROAMATINE ?Take 10 mg by mouth 3 (three) times daily. ?  ?multivitamin Tabs tablet ?Take 1 tablet by mouth every morning. ?  ?nitroGLYCERIN 0.4 MG SL tablet ?Commonly known as: NITROSTAT ?Place 1 tablet (0.4 mg total) under the tongue every 5 (five) minutes as needed for chest pain. ?  ?oxyCODONE 5 MG immediate release tablet ?Commonly known as: Oxy IR/ROXICODONE ?Take 1 tablet (5 mg total) by mouth every 6 (six) hours as needed for severe pain. ?  ?sevelamer carbonate 800 MG tablet ?Commonly known as: RENVELA ?Take 800 mg by mouth See admin instructions. Take one tablet (800 mg) by mouth three times daily - morning, evening and bedtime ?  ?VITAMIN B-12 PO ?Take 1 tablet by mouth daily. ?  ?VITAMIN D3 PO ?Take 1 capsule by mouth See admin instructions. Twice a week - [place] ?  ? ?  ? ? Follow-up Information   ? ? [name], PA Follow up on 11/27/2021.   ?Specialty: Physician Assistant ?Why:  2 pm for hospital follow up ?Contact information: ?[address] ?[place] [address] ?[phone] ? ? ?  ?  ? ? [facility] Follow up.   ?Why: outpatient palliative services ?Contact information: ?[place] ?[place] ?[phone] ? ?  ?  ? ? [facility] Follow up.   ?Why: HHPT with [facility]- they will call you to set up apt times ?Contact information: ?[address] ?[place] [address] ?[phone] ? ? ?  ?  ? ?  ?  ? ?  ? ?Discharge Exam: ?Filed Weights  ? 11/22/21 0702 11/22/21 1053 11/23/21 0300  ?Weight: 47.8 kg 43.1 kg 44.8 kg  ? ?BP 96/76 (BP Location: Left Leg)   Pulse 79   Temp 98.7 ?F (37.1 ?C) (Oral)   Resp 19   Ht '5\' 8"'$  (1.727 m)    Wt 44.8 kg   SpO2 99%   BMI 15.02 kg/m?  ? ?Patient is feeling well, no dyspnea or chest pain ? ?Neurology awake and alert ?ENT with no pallor ?Cardiovascular with S1 and S2 present and rhythmic, with no gallops, p

## 2021-11-23 NOTE — Progress Notes (Signed)
?[facility] ?Progress Note  ? ?Subjective:   seen in room, on nasal cannula O2, says he is going home ? ?Objective ?Vitals:  ? 11/22/21 1745 11/23/21 0300 11/23/21 0330 11/23/21 0835  ?BP: (!) 113/59  96/76   ?Pulse: 90  79   ?Resp: (!) 21  19   ?Temp: 97.8 ?F (36.6 ?C)  98.9 ?F (37.2 ?C) 98.7 ?F (37.1 ?C)  ?TempSrc: Oral  Oral Oral  ?SpO2: 93%  99%   ?Weight:  44.8 kg    ?Height:      ? ?Physical Exam ?General: Chronically-ill appearing; awake and alert ?Heart: S1 and S2 ?Lungs: Clear anteriorly; diminished at bases bilat ?Abdomen: soft and non-tender ?Extremities: No edema BLLE ?Dialysis Access: RIJ TDC; R BBF (+) thrill  ? ? ? ?Dialysis Orders: [facility], MWF ? 3.5h   400/800   2/2.5 bath  P2  45.5kg  RIJ TDC   Hep none ? - on 3/17 > hep B Ag was neg, and hep B Ab's were low/ not protective ?- mircera 168mg q2weeks (last dose: 3/16) ? ?Assessment/Plan: ?1) ESRD: on HD MWF. With hx noncompliance not staying for full sessions. Required 3 HD in a row including today, to get his resp status in better shape.  Got 2 L off today. Next HD Monday. Pt is declining over the last 6 mos and cannot make it more than a 2- 3 wks out of the hospital now. Pt is DNR. Pt not interested in stopping HD at this time. For dc today.  ?  ?2) Anemia of Chronic Kidney Disease: Hemoglobin 10. Last ESA given 3/16, not due til 3/30.  ?  ?3) Volume/ resp distress : s/p thoracentesis in IR 3/18-removed 800cc cloudy/rust color fluid. Had resp distress mid-week this week requiring NRB mask and HFNC up to 15 lpm. Had HD 3 days in a row and now he is back to his baseline of 6 L [place].  ?  ?4)  Secondary Hyperparathyroidism/Hyperphosphatemia: PO4 high, resume home binders ?  ?5)  Pleural effusion: S/p thora 3/18-please see above ?  ?6)  [facility]-planning on doing AVG as an outpatient (last seen in Nov 2022) ?  ?7)  afib -  on eliquis, Cardizem CD ?  ?8)  h/o  rib fractures ?-per PMD ? ?9) Dispo - OK for dc  from renal standpoint.  ? ? ?RKelly Splinter MD ?11/19/2021, 4:19 PM ? ?Filed Weights  ? 11/22/21 0702 11/22/21 1053 11/23/21 0300  ?Weight: 47.8 kg 43.1 kg 44.8 kg  ? ? ?Intake/Output Summary (Last 24 hours) at 11/23/2021 1448 ?Last data filed at 11/23/2021 1312 ?Gross per 24 hour  ?Intake 1076 ml  ?Output --  ?Net 1076 ml  ? ? ? ?Additional Objective ?Labs: ?Basic Metabolic Panel: ?Recent Labs  ?Lab 11/20/21 ?1220 11/21/21 ?0683403/24/23 ?01962 ?NA 135 138 139  ?K 5.4* 4.5 3.9  ?CL 96* 100 100  ?CO2 '26 27 27  '$ ?GLUCOSE 123* 91 81  ?BUN 60* 36* 26*  ?CREATININE 7.00* 4.50* 3.64*  ?CALCIUM 10.3 9.8 10.3  ?PHOS  --  5.4* 5.5*  ? ? ?Liver Function Tests: ?Recent Labs  ?Lab 11/21/21 ?0229703/24/23 ?09892 ?ALBUMIN 2.9* 2.7*  ? ? ?No results for input(s): LIPASE, AMYLASE in the last 168 hours. ?CBC: ?Recent Labs  ?Lab 11/18/21 ?0119403/22/23 ?1220 11/22/21 ?0641  ?WBC 6.3 5.7 7.6  ?HGB 7.8* 7.5* 7.9*  ?HCT 24.9* 23.5* 24.6*  ?MCV 103.3* 99.2  99.6  ?PLT 165 198 191  ? ? ?Blood Culture ?   ?Component Value Date/Time  ? SDES BLOOD BLOOD LEFT FOREARM 09/19/2021 1443  ? SPECREQUEST  09/19/2021 1443  ?  BOTTLES DRAWN AEROBIC AND ANAEROBIC Blood Culture adequate volume  ? CULT  09/19/2021 1443  ?  NO GROWTH 5 DAYS ?Performed at [facility], [place] [address] [place], [place] [address] ?  ? REPTSTATUS 09/24/2021 FINAL 09/19/2021 1443  ? ? ?Cardiac Enzymes: ?No results for input(s): CKTOTAL, CKMB, CKMBINDEX, TROPONINI in the last 168 hours. ?CBG: ?No results for input(s): GLUCAP in the last 168 hours. ? ?Iron Studies: No results for input(s): IRON, TIBC, TRANSFERRIN, FERRITIN in the last 72 hours. ?Lab Results  ?Component Value Date  ? INR 1.3 (H) 09/20/2021  ? INR 1.2 08/12/2021  ? INR 1.4 (H) 07/17/2021  ? ?Studies/Results: ?DG CHEST PORT 1 VIEW ? ?Result Date: 11/21/2021 ?CLINICAL DATA:  Shortness of breath EXAM: PORTABLE CHEST 1 VIEW COMPARISON:  November 19, 2021 FINDINGS: Right upper central venous catheter with tip overlying the  right atrium. Cardiac silhouette is partially obscured but appears enlarged, unchanged. Central vascular prominence. Increased large right pleural effusion with adjacent atelectasis. Bilateral interstitial alveolar opacities appears similar prior. The visualized skeletal structures are unchanged. Cervical fusion hardware. IMPRESSION: 1. Increased large right pleural effusion with adjacent atelectasis. 2. Persistent bilateral interstitial and alveolar opacities, likely pulmonary edema. Electronically Signed   By: Dahlia Bailiff M.D.   On: 11/21/2021 15:29   ? ?Medications: ? ? apixaban  2.5 mg Oral BID  ? calcium acetate  667 mg Oral TID WC  ? Chlorhexidine Gluconate Cloth  6 each Topical Q0600  ? diltiazem  300 mg Oral Daily  ? feeding supplement  237 mL Oral BID BM  ? mouth rinse  15 mL Mouth Rinse BID  ? sodium chloride flush  3 mL Intravenous Q12H  ? ?  ?

## 2021-11-23 NOTE — Progress Notes (Signed)
? ?  Palliative Medicine Inpatient Follow Up Note ? ?Plan for discharge today. ? ?OP Palliative care has been arranged through [facility]. ? ?Per conversation with patients HCPOA, [name] he is very high risk for readmission. ? ?No Additional Palliative needs at this time. ? ?No Charge. ?______________________________________________________________________________________ ?Tacey Ruiz ?[place] Team ?Team Cell Phone: [phone] ?Please utilize secure chat with additional questions, if there is no response within 30 minutes please call the above phone number ? ?Palliative Medicine Team providers are available by phone from 7am to 7pm daily and can be reached through the team cell phone.  ?Should this patient require assistance outside of these hours, please call the patient's attending physician. ? ? ? ? ?

## 2021-11-23 NOTE — Progress Notes (Signed)
Discharge instructions given. Patient verbalized understanding and all questions were answered.  ?

## 2021-11-24 ENCOUNTER — Telehealth (HOSPITAL_COMMUNITY): Payer: Self-pay | Admitting: Nephrology

## 2021-11-24 NOTE — Telephone Encounter (Signed)
Transition of care contact from inpatient facility ? ?Date of discharge: 11/23/21 ?Date of contact: 11/24/21 ?Method: Phone ?Spoke to: Patient ? ?Patient contacted to discuss transition of care from recent inpatient hospitalization. Patient was admitted to [facility] from 3/17 - 11/23/21 with discharge diagnosis of acute respiratory distress, pleural effusion, pulm edema/volume overload. ? ?Medication changes were reviewed. No dyspnea today, feels well. ? ?Patient will follow up with his/her outpatient HD unit on: MWF - tomorrow. Says "I'll be there." ? ?Will continue close monitoring as outpatient given Hx non-compliance and frequent admits. ? ?Veneta Penton, PA-C ?[facility] ?Pager [phone] ? ? ? ?

## 2021-12-02 IMAGING — DX DG CHEST 1V PORT
2 series · 2 of 2 positions shown · non-contrast
Comparison: February 18, 2021

CLINICAL DATA: Shortness of breath.

EXAM:
PORTABLE CHEST 1 VIEW

[chest ap (1 of 2)]
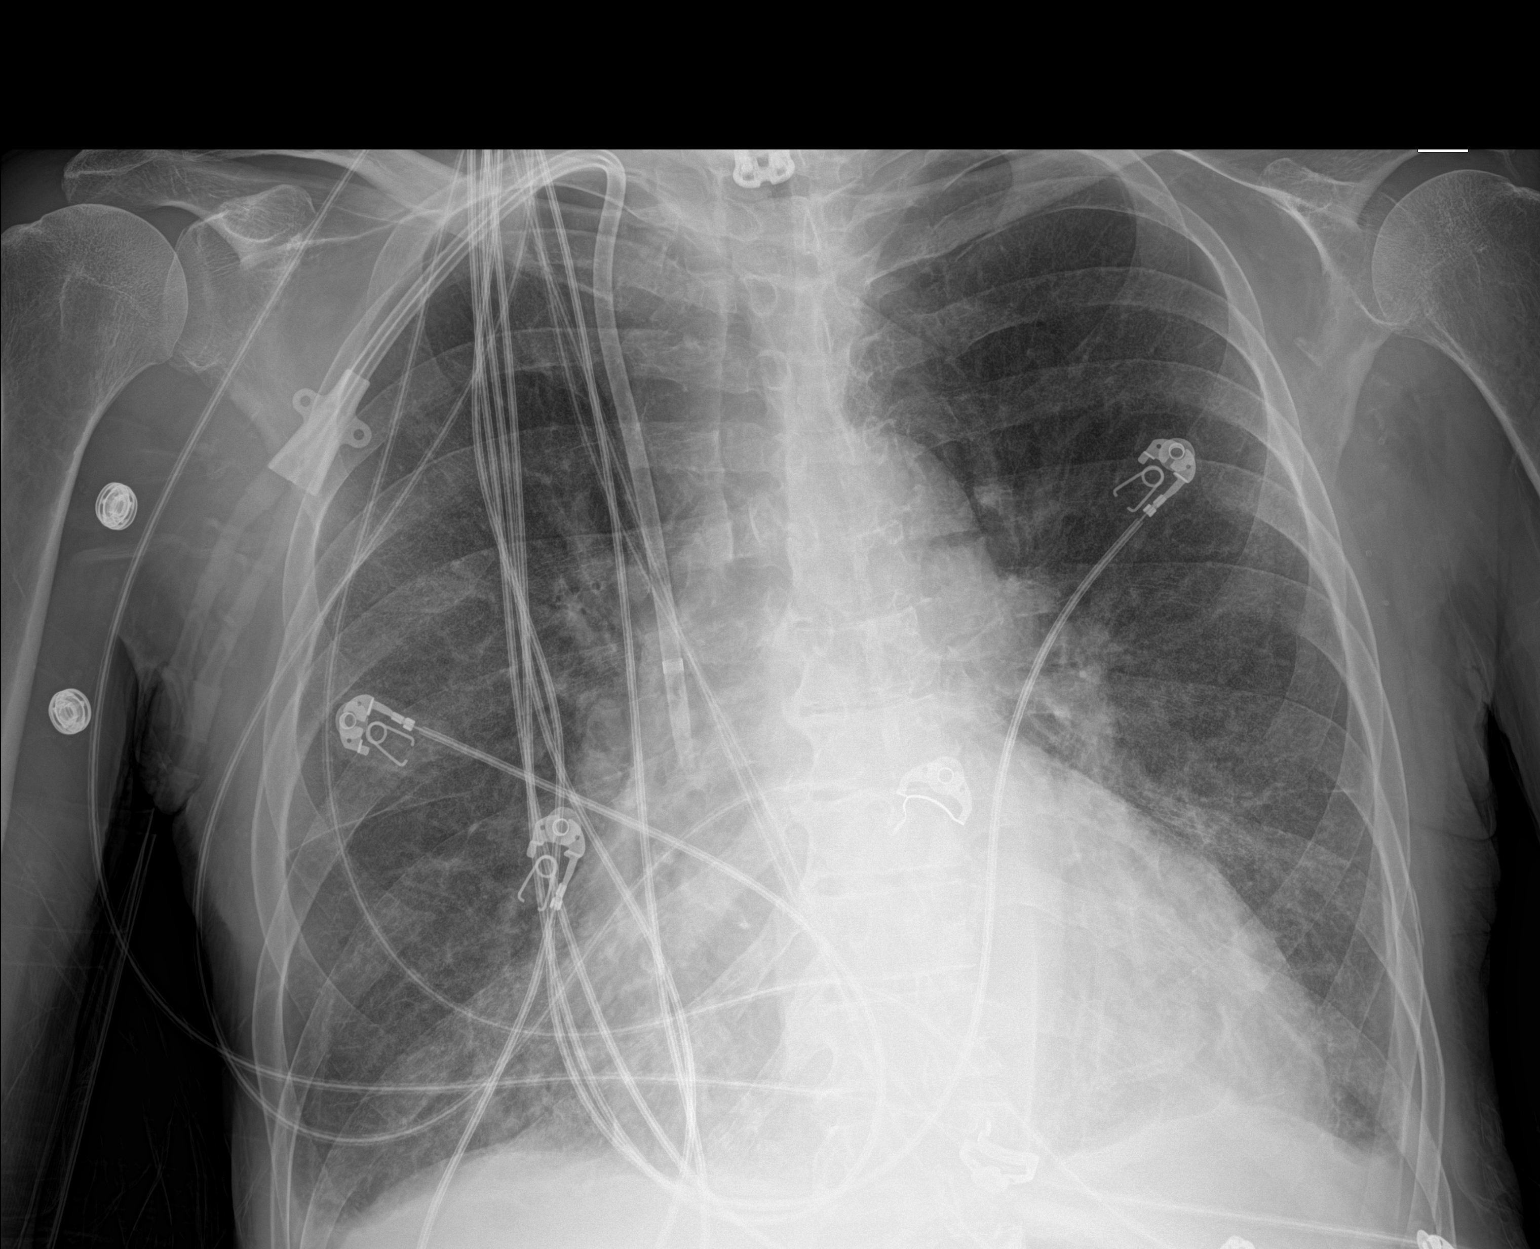

[chest ap (2 of 2)]
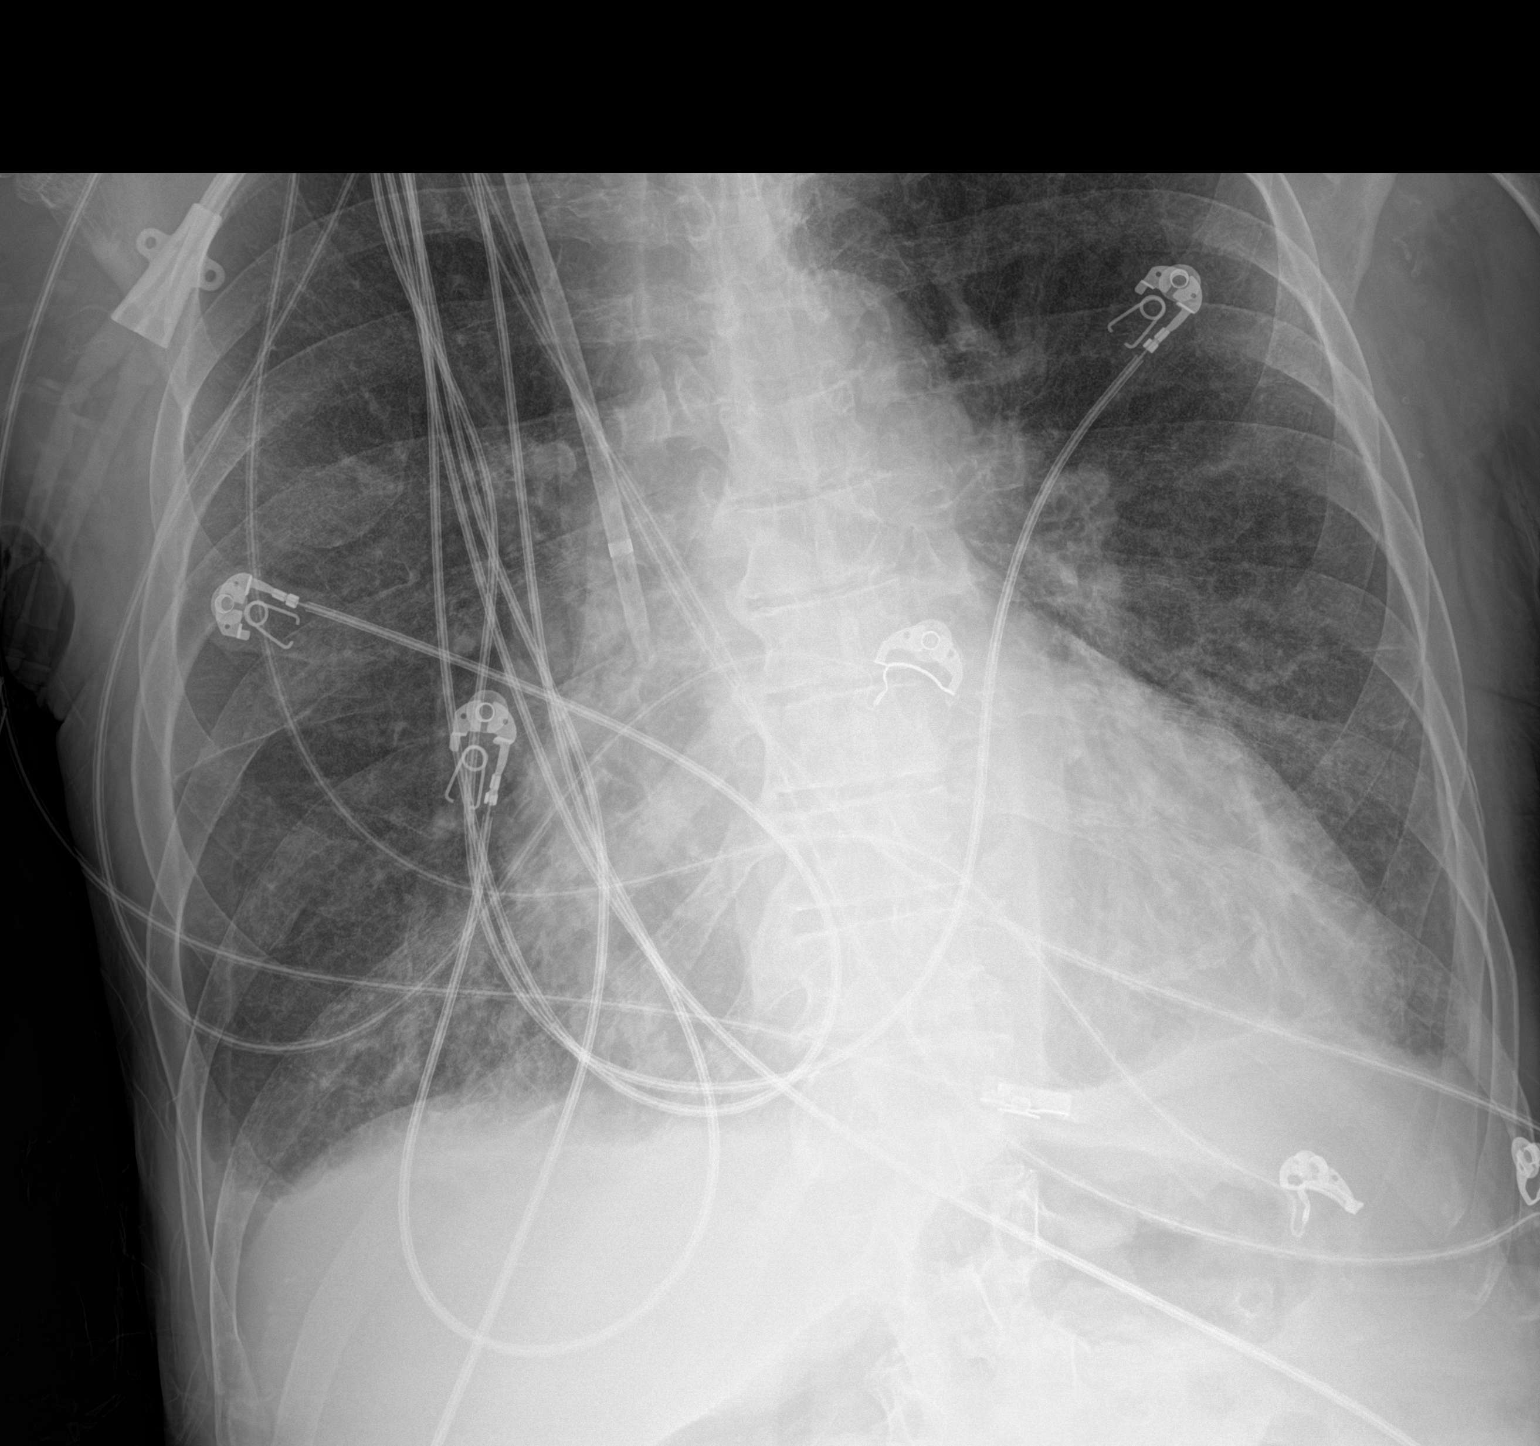

[2 of 2 positions shown; findings below may reference images not displayed]

FINDINGS: There is stable right internal jugular venous catheter positioning.
The lungs are hyperinflated with diffuse, chronic appearing
increased interstitial lung markings. Mild areas of bilateral
infrahilar atelectasis and/or infiltrate are seen. Very small
bilateral pleural effusions are noted. No pneumothorax is
identified. The cardiac silhouette is mildly enlarged and unchanged
in size. A radiopaque fusion plate and screws are seen overlying the
lower cervical spine.
IMPRESSION: Stable cardiomegaly with mild bilateral infrahilar atelectasis
and/or infiltrate.

## 2021-12-04 ENCOUNTER — Inpatient Hospital Stay (HOSPITAL_COMMUNITY)
Admission: EM | Admit: 2021-12-04 | Discharge: 2021-12-06 | DRG: 291 | Disposition: A | Discharge status: Home / Self Care | Payer: Medicare Other | Attending: Internal Medicine | Admitting: Internal Medicine

## 2021-12-04 ENCOUNTER — Observation Stay (HOSPITAL_COMMUNITY): Payer: Medicare Other

## 2021-12-04 ENCOUNTER — Emergency Department (HOSPITAL_COMMUNITY): Payer: Medicare Other

## 2021-12-04 DIAGNOSIS — R079 Chest pain, unspecified: Secondary | ICD-10-CM | POA: Diagnosis present

## 2021-12-04 DIAGNOSIS — I4892 Unspecified atrial flutter: Secondary | ICD-10-CM | POA: Diagnosis present

## 2021-12-04 DIAGNOSIS — Z86711 Personal history of pulmonary embolism: Secondary | ICD-10-CM

## 2021-12-04 DIAGNOSIS — J449 Chronic obstructive pulmonary disease, unspecified: Secondary | ICD-10-CM | POA: Diagnosis present

## 2021-12-04 DIAGNOSIS — J918 Pleural effusion in other conditions classified elsewhere: Secondary | ICD-10-CM | POA: Diagnosis present

## 2021-12-04 DIAGNOSIS — Z8674 Personal history of sudden cardiac arrest: Secondary | ICD-10-CM

## 2021-12-04 DIAGNOSIS — I5033 Acute on chronic diastolic (congestive) heart failure: Secondary | ICD-10-CM | POA: Diagnosis present

## 2021-12-04 DIAGNOSIS — I083 Combined rheumatic disorders of mitral, aortic and tricuspid valves: Secondary | ICD-10-CM | POA: Diagnosis present

## 2021-12-04 DIAGNOSIS — Z91199 Patient's noncompliance with other medical treatment and regimen due to unspecified reason: Secondary | ICD-10-CM

## 2021-12-04 DIAGNOSIS — Z79899 Other long term (current) drug therapy: Secondary | ICD-10-CM

## 2021-12-04 DIAGNOSIS — E440 Moderate protein-calorie malnutrition: Secondary | ICD-10-CM | POA: Diagnosis present

## 2021-12-04 DIAGNOSIS — I7121 Aneurysm of the ascending aorta, without rupture: Secondary | ICD-10-CM | POA: Diagnosis present

## 2021-12-04 DIAGNOSIS — Z91148 Patient's other noncompliance with medication regimen for other reason: Secondary | ICD-10-CM

## 2021-12-04 DIAGNOSIS — Z87892 Personal history of anaphylaxis: Secondary | ICD-10-CM

## 2021-12-04 DIAGNOSIS — J9611 Chronic respiratory failure with hypoxia: Secondary | ICD-10-CM | POA: Diagnosis present

## 2021-12-04 DIAGNOSIS — K219 Gastro-esophageal reflux disease without esophagitis: Secondary | ICD-10-CM | POA: Diagnosis present

## 2021-12-04 DIAGNOSIS — Z91158 Patient's noncompliance with renal dialysis for other reason: Secondary | ICD-10-CM

## 2021-12-04 DIAGNOSIS — Z992 Dependence on renal dialysis: Secondary | ICD-10-CM

## 2021-12-04 DIAGNOSIS — Z7901 Long term (current) use of anticoagulants: Secondary | ICD-10-CM

## 2021-12-04 DIAGNOSIS — G8929 Other chronic pain: Secondary | ICD-10-CM | POA: Diagnosis present

## 2021-12-04 DIAGNOSIS — I132 Hypertensive heart and chronic kidney disease with heart failure and with stage 5 chronic kidney disease, or end stage renal disease: Secondary | ICD-10-CM | POA: Diagnosis not present

## 2021-12-04 DIAGNOSIS — E785 Hyperlipidemia, unspecified: Secondary | ICD-10-CM | POA: Diagnosis present

## 2021-12-04 DIAGNOSIS — N2581 Secondary hyperparathyroidism of renal origin: Secondary | ICD-10-CM | POA: Diagnosis present

## 2021-12-04 DIAGNOSIS — I480 Paroxysmal atrial fibrillation: Secondary | ICD-10-CM | POA: Diagnosis present

## 2021-12-04 DIAGNOSIS — Z9981 Dependence on supplemental oxygen: Secondary | ICD-10-CM

## 2021-12-04 DIAGNOSIS — M1990 Unspecified osteoarthritis, unspecified site: Secondary | ICD-10-CM | POA: Diagnosis present

## 2021-12-04 DIAGNOSIS — Z8673 Personal history of transient ischemic attack (TIA), and cerebral infarction without residual deficits: Secondary | ICD-10-CM

## 2021-12-04 DIAGNOSIS — N186 End stage renal disease: Secondary | ICD-10-CM | POA: Diagnosis not present

## 2021-12-04 DIAGNOSIS — Z981 Arthrodesis status: Secondary | ICD-10-CM

## 2021-12-04 DIAGNOSIS — J90 Pleural effusion, not elsewhere classified: Secondary | ICD-10-CM | POA: Diagnosis present

## 2021-12-04 DIAGNOSIS — R778 Other specified abnormalities of plasma proteins: Secondary | ICD-10-CM | POA: Diagnosis present

## 2021-12-04 LAB — TROPONIN I (HIGH SENSITIVITY)
Troponin I (High Sensitivity): 60 ng/L — ABNORMAL HIGH (ref <18)
Troponin I (High Sensitivity): 71 ng/L — ABNORMAL HIGH (ref <18)
Troponin I (High Sensitivity): 62 ng/L — ABNORMAL HIGH (ref <18)

## 2021-12-04 LAB — CBC WITH DIFFERENTIAL/PLATELET
Abs Immature Granulocytes: 0.02 10*3/uL (ref 0.00–0.07)
Basophils Absolute: 0.1 10*3/uL (ref 0.0–0.1)
Basophils Relative: 1 %
Eosinophils Absolute: 0.2 10*3/uL (ref 0.0–0.5)
Eosinophils Relative: 3 %
HCT: 29.6 % — ABNORMAL LOW (ref 39.0–52.0)
Hemoglobin: 9.5 g/dL — ABNORMAL LOW (ref 13.0–17.0)
Immature Granulocytes: 0 %
Lymphocytes Relative: 9 %
Lymphs Abs: 0.6 10*3/uL — ABNORMAL LOW (ref 0.7–4.0)
MCH: 32.4 pg (ref 26.0–34.0)
MCHC: 32.1 g/dL (ref 30.0–36.0)
MCV: 101 fL — ABNORMAL HIGH (ref 80.0–100.0)
Monocytes Absolute: 0.5 10*3/uL (ref 0.1–1.0)
Monocytes Relative: 7 %
Neutro Abs: 5.1 10*3/uL (ref 1.7–7.7)
Neutrophils Relative %: 80 %
Platelets: 216 10*3/uL (ref 150–400)
RBC: 2.93 MIL/uL — ABNORMAL LOW (ref 4.22–5.81)
RDW: 18 % — ABNORMAL HIGH (ref 11.5–15.5)
WBC: 6.4 10*3/uL (ref 4.0–10.5)
nRBC: 0 % (ref 0.0–0.2)

## 2021-12-04 LAB — BASIC METABOLIC PANEL
Anion gap: 15 (ref 5–15)
BUN: 47 mg/dL — ABNORMAL HIGH (ref 8–23)
CO2: 25 mmol/L (ref 22–32)
Calcium: 8.9 mg/dL (ref 8.9–10.3)
Chloride: 97 mmol/L — ABNORMAL LOW (ref 98–111)
Creatinine, Ser: 5.83 mg/dL — ABNORMAL HIGH (ref 0.61–1.24)
GFR, Estimated: 10 mL/min — ABNORMAL LOW (ref >60)
Glucose, Bld: 117 mg/dL — ABNORMAL HIGH (ref 70–99)
Potassium: 4.2 mmol/L (ref 3.5–5.1)
Sodium: 137 mmol/L (ref 135–145)

## 2021-12-04 MED ORDER — NITROGLYCERIN 0.4 MG SL SUBL
0.4000 mg | SUBLINGUAL_TABLET | SUBLINGUAL | Status: DC | PRN
  Administered 2021-12-04: 0.4 mg via SUBLINGUAL
  Filled 2021-12-04: qty 1

## 2021-12-04 MED ORDER — NITROGLYCERIN 0.4 MG SL SUBL: 0.4000 mg | SUBLINGUAL_TABLET | SUBLINGUAL | Status: DC | PRN

## 2021-12-04 MED ORDER — IOHEXOL 350 MG/ML SOLN
75.0000 mL | Freq: Once | INTRAVENOUS | Status: AC | PRN
Start: 2021-12-04 — End: 2021-12-04
  Administered 2021-12-04: 75 mL via INTRAVENOUS

## 2021-12-04 MED ORDER — OXYCODONE-ACETAMINOPHEN 5-325 MG PO TABS
1.0000 | ORAL_TABLET | Freq: Once | ORAL | Status: AC
  Administered 2021-12-04: 1 via ORAL
  Filled 2021-12-04: qty 1

## 2021-12-04 MED ORDER — ACETAMINOPHEN 325 MG PO TABS: 650.0000 mg | ORAL_TABLET | ORAL | Status: DC | PRN

## 2021-12-04 MED ORDER — MORPHINE SULFATE (PF) 2 MG/ML IV SOLN
1.0000 mg | INTRAVENOUS | Status: DC | PRN
  Administered 2021-12-05 (×4): 1 mg via INTRAVENOUS
  Filled 2021-12-04 (×4): qty 1

## 2021-12-04 NOTE — Assessment & Plan Note (Addendum)
Chest pain improved after thoracentesis.  ?Patient has ruled out for acute coronary syndrome. ?Troponin has remained stable 48,83,01,41  ?EKG with no changes.  ? ?

## 2021-12-04 NOTE — ED Triage Notes (Signed)
Here for CP. Hx cardiac arrest. Describes it as pain in the center of the chest. Non-radiating. Ekg w EMS was remarkable. Pt is a HD pt, does his HD on Mon, Wed, and Friday. Pt wears oxygen at home 4L. A&OX4. Vss.  ? ?ASA given  ?

## 2021-12-04 NOTE — H&P (Addendum)
?History and Physical  ? ? ?Patient: Thomas Robinson KWI:[id number] DOB: 12-21-1960 ?DOA: 12/04/2021 ?DOS: the patient was seen and examined on 12/05/2021 ?PCP: [name], PA  ?Patient coming from: Home ? ?Chief Complaint:  ?Chief Complaint  ?Patient presents with  ? Chest Pain  ? ?HPI: Thomas Robinson is a 61 y.o. male with medical history significant of ESRD MWF, paroxysmal atrial fibrillation on Eliquis, chronic hypoxemia on 5 L, thoracic ascending aortic aneurysm, HFpEF, history of PE in December/2022 who presents with concerns of chest pain and increasing shortness of breath. ? ?Patient reports that yesterday he was laying down when he felt focal midsternal pressure-like chest pain and was gasping for air.  Also felt his hands and legs were "funny" and felt very weak.  Today chest pain increased in intensity following dialysis.  States it felt like "cinderblocks" sitting on his chest.  Denies any radiation of his pain.  Patient has history of pulmonary embolism back in December 2022 thinks he has not taken his Eliquis in a week but due to.  Later reports that a family member does his medications. ?He denies any missed dialysis this week. ? ?He was recently admitted from 3/17 to 3/25 with acute on chronic heart failure and underwent 3 consecutive days of dialysis for fluid removal.  He also had bilateral pleural effusion requiring thoracentesis with 800 cc fluid removal. ? ?In the ED, he was afebrile normotensive at home his baseline 5 L via nasal cannula.  No leukocytosis, hemoglobin 9.5 from prior of 7.9.  Creatinine was elevated at 5.83 from prior of 3.64 although unclear what of what his actual baseline is. ? ?Troponin was elevated at 60 and then 71.  Usually baseline around 40-50. ?Chest x-ray shows mild cardiogenic failure with moderate pleural effusion. ?He was given a dose of 5-'325mg'$  Percocet in the ED with no improvement in pain.  Hospitalist then called for admission. ?EKG in sinus rhythm with PACs  and left anterior fascicular block. ? ?Review of Systems: As mentioned in the history of present illness. All other systems reviewed and are negative. ?Past Medical History:  ?Diagnosis Date  ? Anaphylactic shock, unspecified, sequela 06/10/2019  ? Aortic insufficiency   ? Aortic stenosis   ? Arthritis   ? Asthma, chronic, unspecified asthma severity, with acute exacerbation 10/23/2017  ? CAP (community acquired pneumonia) 10/23/2017  ? Carpal tunnel syndrome of right wrist 10/05/2018  ? Cataract   ? right - removed by surgery  ? Cerebral infarction due to thrombosis of cerebral artery ([place])   ? Cervical disc herniation 01/04/2020  ? Chronic diastolic heart failure ([place]) 04/13/2018  ? Chronic low back pain 11/24/2019  ? Constipation   ? COPD (chronic obstructive pulmonary disease) ([place])   ? Cough   ? chronic cough  ? Encounter for immunization 07/08/2017  ? ESRD on hemodialysis ([place]) 02/16/2018  ? Fall 06/04/2019  ? GERD (gastroesophageal reflux disease) 06/04/2019  ? GI bleeding 05/24/2017  ? Gram-negative sepsis, unspecified ([place]) 09/05/2017  ? History of fusion of cervical spine 03/26/2020  ? Hyperlipidemia   ? Hypertension   ? Hypokalemia 06/04/2017  ? Hypotension   ? Iron deficiency anemia, unspecified 09/09/2017  ? Left shoulder pain 11/11/2019  ? Lumbar radiculopathy 11/24/2019  ? Lung contusion 06/04/2019  ? LVH (left ventricular hypertrophy) due to hypertensive disease, with heart failure ([place]) 06/18/2020  ? Macrocytic anemia 05/24/2017  ? Mitral regurgitation   ? Moderate protein-calorie malnutrition ([place]) 06/06/2017  ? Myofascial pain syndrome  06/12/2020  ? Neuritis of right ulnar nerve 09/13/2018  ? Non-compliance with renal dialysis ([place]) 02/08/2020  ? Oxygen deficiency 12/30/2019  ? O2 sats on RA 87% at PAT appt   ? Pain in joint of right elbow 09/13/2018  ? Paroxysmal atrial flutter ([place])   ? PEA (Pulseless electrical activity) ([place])   ? after sedation in 07/2021  ? Pulmonary hypertension ([place])   ?  Renovascular hypertension 06/22/2020  ? Restless leg syndrome   ? Rib fractures 06/2019  ? Right  ? S/P cardiac cath 08/27/2020  ? normal coronary arteries.  ? Secondary hyperparathyroidism of renal origin ([place]) 06/04/2017  ? Thoracic ascending aortic aneurysm   ? Tricuspid regurgitation   ? Ulnar neuropathy 10/05/2018  ? Volume overload 10/23/2017  ? ?Past Surgical History:  ?Procedure Laterality Date  ? A/V FISTULAGRAM N/A 01/01/2021  ? Procedure: A/V FISTULAGRAM;  Surgeon: Serafina Mitchell, MD;  Location: [facility];  Service: Cardiovascular;  Laterality: N/A;  ? A/V FISTULAGRAM N/A 01/15/2021  ? Procedure: A/V FISTULAGRAM;  Surgeon: Serafina Mitchell, MD;  Location: [facility];  Service: Cardiovascular;  Laterality: N/A;  ? ANTERIOR CERVICAL DECOMP/DISCECTOMY FUSION N/A 01/04/2020  ? Procedure: ANTERIOR CERVICAL DECOMPRESSION/DISCECTOMY FUSION CERVICAL FIVE THROUGH SEVEN;  Surgeon: Melina Schools, MD;  Location: [facility];  Service: Orthopedics;  Laterality: N/A;  3 hrs  ? AV FISTULA PLACEMENT Left 05/28/2017  ? Procedure: LEFT ARM ARTERIOVENOUS (AV) FISTULA CREATION;  Surgeon: Conrad Milbank, MD;  Location: [facility];  Service: Vascular;  Laterality: Left;  ? AV FISTULA PLACEMENT Left 02/02/2021  ? Procedure: Debride left forearm, ligation of left upper arm Aretiovenous fistula;  Surgeon: Elam Dutch, MD;  Location: [facility] OR;  Service: Vascular;  Laterality: Left;  ? AV FISTULA PLACEMENT Right 04/04/2021  ? Procedure: RIGHT ARTERIOVENOUS (AV) FISTULA CREATION;  Surgeon: Serafina Mitchell, MD;  Location: [facility];  Service: Vascular;  Laterality: Right;  ? BIOPSY  07/10/2021  ? Procedure: BIOPSY;  Surgeon: Lavena Bullion, DO;  Location: [facility];  Service: Gastroenterology;;  ? COLONOSCOPY WITH PROPOFOL N/A 07/10/2021  ? Procedure: COLONOSCOPY WITH PROPOFOL;  Surgeon: Lavena Bullion, DO;  Location: [facility];  Service: Gastroenterology;  Laterality: N/A;  ? ESOPHAGOGASTRODUODENOSCOPY (EGD) WITH PROPOFOL  N/A 07/10/2021  ? Procedure: ESOPHAGOGASTRODUODENOSCOPY (EGD) WITH PROPOFOL;  Surgeon: Lavena Bullion, DO;  Location: [facility];  Service: Gastroenterology;  Laterality: N/A;  ? EYE SURGERY Right 06/02/2019  ? Cataract removed  ? FRACTURE SURGERY    ? INSERTION OF DIALYSIS CATHETER Right 02/02/2021  ? Procedure: INSERTION OF Right Internal jugular TUNNELED  DIALYSIS CATHETER;  Surgeon: Elam Dutch, MD;  Location: [facility] OR;  Service: Vascular;  Laterality: Right;  ? IR DIALY SHUNT INTRO NEEDLE/INTRACATH INITIAL W/IMG LEFT Left 09/12/2020  ? IR FLUORO GUIDE CV LINE RIGHT  05/25/2017  ? IR FLUORO GUIDE CV LINE RIGHT  07/17/2021  ? IR THORACENTESIS ASP PLEURAL SPACE W/IMG GUIDE  09/11/2021  ? IR US GUIDE VASC ACCESS LEFT  09/12/2020  ? IR US GUIDE VASC ACCESS RIGHT  05/25/2017  ? IR US GUIDE VASC ACCESS RIGHT  07/17/2021  ? LIGATION OF ARTERIOVENOUS  FISTULA Left 01/10/2021  ? Procedure: LIGATION OF LEFT ARM RADIOCEPHALIC FISTULA;  Surgeon: Serafina Mitchell, MD;  Location: [facility];  Service: Vascular;  Laterality: Left;  ? PERIPHERAL VASCULAR BALLOON ANGIOPLASTY Left 01/15/2021  ? Procedure: PERIPHERAL VASCULAR BALLOON ANGIOPLASTY;  Surgeon: Serafina Mitchell, MD;  Location: [facility]  INVASIVE CV LAB;  Service: Cardiovascular;  Laterality: Left;  AVF  ? REVISON OF ARTERIOVENOUS FISTULA Left 07/18/2019  ? Procedure: REVISION PLICATION OF RADIOCEPHALIC ARTERIOVENOUS FISTULA LEFT ARM;  Surgeon: Angelia Mould, MD;  Location: [facility] OR;  Service: Vascular;  Laterality: Left;  ? REVISON OF ARTERIOVENOUS FISTULA Left 01/10/2021  ? Procedure: CONVERSION TO BRACHIOCEPHALIC ARTERIOVENOUS FISTULA;  Surgeon: Serafina Mitchell, MD;  Location: [facility];  Service: Vascular;  Laterality: Left;  ? RINOPLASTY    ? ?Social History:  reports that he has never smoked. He has never used smokeless tobacco. He reports that he does not currently use alcohol. He reports that he does not use drugs. ? ?Allergies  ?Allergen Reactions  ? Vancomycin Shortness  Of Breath and Itching  ? ? ?Family History  ?Problem Relation Age of Onset  ? Cancer Mother   ? ? ?Prior to Admission medications   ?Medication Sig Start Date End Date Taking? Authorizing Provider  ?ace

## 2021-12-04 NOTE — Assessment & Plan Note (Addendum)
Electrolytes are stable on admission. ?His dyspnea has improved after thoracentesis. ? ?Plan to continue with renal replacement therapy tomorrow in the outpatient unit.  ?

## 2021-12-04 NOTE — Assessment & Plan Note (Addendum)
Heart rate has been controlled, continue with diltiazem. ?Anticoagulation with apixaban.  ?

## 2021-12-04 NOTE — ED Provider Notes (Signed)
?[facility] ?Provider Note ? ? ?CSN: [id number] ?Arrival date & time: 12/04/21  1756 ? ?  ? ?History ? ?Chief Complaint  ?Patient presents with  ? Chest Pain  ? ? ?Thomas Robinson is a 61 y.o. male. ? ? ?Chest Pain ?Associated symptoms: shortness of breath   ?Associated symptoms: no back pain and no weakness   ?Patient presents with chest pain.  Anterior chest.  Dull.  States began during dialysis.  Dialyzed today.  Is on oxygen.  No fevers or chills.  No cough.  States he does have a cyst or something on his buttock that also hurts.  No fevers or chills.  No coughing. ?  ?Past Medical History:  ?Diagnosis Date  ? Anaphylactic shock, unspecified, sequela 06/10/2019  ? Aortic insufficiency   ? Aortic stenosis   ? Arthritis   ? Asthma, chronic, unspecified asthma severity, with acute exacerbation 10/23/2017  ? CAP (community acquired pneumonia) 10/23/2017  ? Carpal tunnel syndrome of right wrist 10/05/2018  ? Cataract   ? right - removed by surgery  ? Cerebral infarction due to thrombosis of cerebral artery ([place])   ? Cervical disc herniation 01/04/2020  ? Chronic diastolic heart failure ([place]) 04/13/2018  ? Chronic low back pain 11/24/2019  ? Constipation   ? COPD (chronic obstructive pulmonary disease) ([place])   ? Cough   ? chronic cough  ? Encounter for immunization 07/08/2017  ? ESRD on hemodialysis ([place]) 02/16/2018  ? Fall 06/04/2019  ? GERD (gastroesophageal reflux disease) 06/04/2019  ? GI bleeding 05/24/2017  ? Gram-negative sepsis, unspecified ([place]) 09/05/2017  ? History of fusion of cervical spine 03/26/2020  ? Hyperlipidemia   ? Hypertension   ? Hypokalemia 06/04/2017  ? Hypotension   ? Iron deficiency anemia, unspecified 09/09/2017  ? Left shoulder pain 11/11/2019  ? Lumbar radiculopathy 11/24/2019  ? Lung contusion 06/04/2019  ? LVH (left ventricular hypertrophy) due to hypertensive disease, with heart failure ([place]) 06/18/2020  ? Macrocytic anemia 05/24/2017  ? Mitral  regurgitation   ? Moderate protein-calorie malnutrition ([place]) 06/06/2017  ? Myofascial pain syndrome 06/12/2020  ? Neuritis of right ulnar nerve 09/13/2018  ? Non-compliance with renal dialysis ([place]) 02/08/2020  ? Oxygen deficiency 12/30/2019  ? O2 sats on RA 87% at PAT appt   ? Pain in joint of right elbow 09/13/2018  ? Paroxysmal atrial flutter ([place])   ? PEA (Pulseless electrical activity) ([place])   ? after sedation in 07/2021  ? Pulmonary hypertension ([place])   ? Renovascular hypertension 06/22/2020  ? Restless leg syndrome   ? Rib fractures 06/2019  ? Right  ? S/P cardiac cath 08/27/2020  ? normal coronary arteries.  ? Secondary hyperparathyroidism of renal origin ([place]) 06/04/2017  ? Thoracic ascending aortic aneurysm   ? Tricuspid regurgitation   ? Ulnar neuropathy 10/05/2018  ? Volume overload 10/23/2017  ? ?Past Surgical History:  ?Procedure Laterality Date  ? A/V FISTULAGRAM N/A 01/01/2021  ? Procedure: A/V FISTULAGRAM;  Surgeon: Serafina Mitchell, MD;  Location: [facility];  Service: Cardiovascular;  Laterality: N/A;  ? A/V FISTULAGRAM N/A 01/15/2021  ? Procedure: A/V FISTULAGRAM;  Surgeon: Serafina Mitchell, MD;  Location: [facility];  Service: Cardiovascular;  Laterality: N/A;  ? ANTERIOR CERVICAL DECOMP/DISCECTOMY FUSION N/A 01/04/2020  ? Procedure: ANTERIOR CERVICAL DECOMPRESSION/DISCECTOMY FUSION CERVICAL FIVE THROUGH SEVEN;  Surgeon: Melina Schools, MD;  Location: [facility];  Service: Orthopedics;  Laterality: N/A;  3 hrs  ? AV  FISTULA PLACEMENT Left 05/28/2017  ? Procedure: LEFT ARM ARTERIOVENOUS (AV) FISTULA CREATION;  Surgeon: Conrad Wyandanch, MD;  Location: [facility];  Service: Vascular;  Laterality: Left;  ? AV FISTULA PLACEMENT Left 02/02/2021  ? Procedure: Debride left forearm, ligation of left upper arm Aretiovenous fistula;  Surgeon: Elam Dutch, MD;  Location[facility] OR;  Service: Vascular;  Laterality: Left;  ? AV FISTULA PLACEMENT Right 04/04/2021  ? Procedure: RIGHT ARTERIOVENOUS (AV) FISTULA  CREATION;  Surgeon: Serafina Mitchell, MD;  Location: [facility];  Service: Vascular;  Laterality: Right;  ? BIOPSY  07/10/2021  ? Procedure: BIOPSY;  Surgeon: Lavena Bullion, DO;  Location: [facility];  Service: Gastroenterology;;  ? COLONOSCOPY WITH PROPOFOL N/A 07/10/2021  ? Procedure: COLONOSCOPY WITH PROPOFOL;  Surgeon: Lavena Bullion, DO;  Location: [facility];  Service: Gastroenterology;  Laterality: N/A;  ? ESOPHAGOGASTRODUODENOSCOPY (EGD) WITH PROPOFOL N/A 07/10/2021  ? Procedure: ESOPHAGOGASTRODUODENOSCOPY (EGD) WITH PROPOFOL;  Surgeon: Lavena Bullion, DO;  Location: [facility];  Service: Gastroenterology;  Laterality: N/A;  ? EYE SURGERY Right 06/02/2019  ? Cataract removed  ? FRACTURE SURGERY    ? INSERTION OF DIALYSIS CATHETER Right 02/02/2021  ? Procedure: INSERTION OF Right Internal jugular TUNNELED  DIALYSIS CATHETER;  Surgeon: Elam Dutch, MD;  Location: [facility] OR;  Service: Vascular;  Laterality: Right;  ? IR DIALY SHUNT INTRO NEEDLE/INTRACATH INITIAL W/IMG LEFT Left 09/12/2020  ? IR FLUORO GUIDE CV LINE RIGHT  05/25/2017  ? IR FLUORO GUIDE CV LINE RIGHT  07/17/2021  ? IR THORACENTESIS ASP PLEURAL SPACE W/IMG GUIDE  09/11/2021  ? IR US GUIDE VASC ACCESS LEFT  09/12/2020  ? IR US GUIDE VASC ACCESS RIGHT  05/25/2017  ? IR US GUIDE VASC ACCESS RIGHT  07/17/2021  ? LIGATION OF ARTERIOVENOUS  FISTULA Left 01/10/2021  ? Procedure: LIGATION OF LEFT ARM RADIOCEPHALIC FISTULA;  Surgeon: Serafina Mitchell, MD;  Location: [facility];  Service: Vascular;  Laterality: Left;  ? PERIPHERAL VASCULAR BALLOON ANGIOPLASTY Left 01/15/2021  ? Procedure: PERIPHERAL VASCULAR BALLOON ANGIOPLASTY;  Surgeon: Serafina Mitchell, MD;  Location: [place] [facility];  Service: Cardiovascular;  Laterality: Left;  AVF  ? REVISON OF ARTERIOVENOUS FISTULA Left 07/18/2019  ? Procedure: REVISION PLICATION OF RADIOCEPHALIC ARTERIOVENOUS FISTULA LEFT ARM;  Surgeon: Angelia Mould, MD;  Location: [facility] OR;  Service: Vascular;  Laterality:  Left;  ? REVISON OF ARTERIOVENOUS FISTULA Left 01/10/2021  ? Procedure: CONVERSION TO BRACHIOCEPHALIC ARTERIOVENOUS FISTULA;  Surgeon: Serafina Mitchell, MD;  Location: [facility];  Service: Vascular;  Laterality: Left;  ? RINOPLASTY    ? ? ?Home Medications ?Prior to Admission medications   ?Medication Sig Start Date End Date Taking? Authorizing Provider  ?acetaminophen (TYLENOL) 500 MG tablet Take 500 mg by mouth every 6 (six) hours as needed for moderate pain (pain).    [provider]  ?albuterol (VENTOLIN HFA) 108 (90 Base) MCG/ACT inhaler Inhale 2 puffs into the lungs 2 (two) times daily as needed for shortness of breath or wheezing. 06/01/21   Oswald Hillock, MD  ?apixaban (ELIQUIS) 2.5 MG TABS tablet Take 1 tablet (2.5 mg total) by mouth 2 (two) times daily. 11/23/21 12/23/21  Arrien, Jimmy Picket, MD  ?Cholecalciferol (VITAMIN D3 PO) Take 1 capsule by mouth See admin instructions. Twice a week - [place]    [provider]  ?cinacalcet (SENSIPAR) 60 MG tablet Take 1 tablet (60 mg total) by mouth every evening. 06/01/21   Eleonore Chiquito  S, MD  ?Cyanocobalamin (VITAMIN B-12 PO) Take 1 tablet by mouth daily.    [provider]  ?diltiazem (CARDIZEM CD) 300 MG 24 hr capsule Take 1 capsule (300 mg total) by mouth daily. ?Patient taking differently: Take 300 mg by mouth every morning. 09/21/21   Ghimire, Henreitta Leber, MD  ?feeding supplement (ENSURE ENLIVE / ENSURE PLUS) LIQD Take 237 mLs by mouth 2 (two) times daily between meals. ?Patient taking differently: Take 237 mLs by mouth daily. 09/13/21   Elgergawy, Silver Huguenin, MD  ?fluticasone furoate-vilanterol (BREO ELLIPTA) 100-25 MCG/ACT AEPB Inhale 1 puff into the lungs daily as needed (For shortness of breath).    [provider]  ?midodrine (PROAMATINE) 10 MG tablet Take 10 mg by mouth 3 (three) times daily.    [provider]  ?multivitamin (RENA-VIT) TABS tablet Take 1 tablet by mouth every morning. 01/12/18   [provider]  ?nitroGLYCERIN (NITROSTAT) 0.4 MG SL tablet Place 1 tablet (0.4 mg total) under the tongue every 5 (five) minutes as needed for chest pain. 10/01/21   Aline August, MD  ?oxyCODONE (OXY IR/ROXIC

## 2021-12-05 ENCOUNTER — Observation Stay (HOSPITAL_COMMUNITY): Payer: Medicare Other

## 2021-12-05 ENCOUNTER — Other Ambulatory Visit: Payer: Self-pay

## 2021-12-05 DIAGNOSIS — J9611 Chronic respiratory failure with hypoxia: Secondary | ICD-10-CM | POA: Diagnosis present

## 2021-12-05 DIAGNOSIS — M1990 Unspecified osteoarthritis, unspecified site: Secondary | ICD-10-CM | POA: Diagnosis present

## 2021-12-05 DIAGNOSIS — I480 Paroxysmal atrial fibrillation: Secondary | ICD-10-CM | POA: Diagnosis present

## 2021-12-05 DIAGNOSIS — Z86711 Personal history of pulmonary embolism: Secondary | ICD-10-CM | POA: Diagnosis not present

## 2021-12-05 DIAGNOSIS — J449 Chronic obstructive pulmonary disease, unspecified: Secondary | ICD-10-CM | POA: Diagnosis present

## 2021-12-05 DIAGNOSIS — Z8673 Personal history of transient ischemic attack (TIA), and cerebral infarction without residual deficits: Secondary | ICD-10-CM | POA: Diagnosis not present

## 2021-12-05 DIAGNOSIS — I7121 Aneurysm of the ascending aorta, without rupture: Secondary | ICD-10-CM | POA: Diagnosis present

## 2021-12-05 DIAGNOSIS — N2581 Secondary hyperparathyroidism of renal origin: Secondary | ICD-10-CM | POA: Diagnosis present

## 2021-12-05 DIAGNOSIS — I4892 Unspecified atrial flutter: Secondary | ICD-10-CM | POA: Diagnosis present

## 2021-12-05 DIAGNOSIS — J918 Pleural effusion in other conditions classified elsewhere: Secondary | ICD-10-CM | POA: Diagnosis present

## 2021-12-05 DIAGNOSIS — E785 Hyperlipidemia, unspecified: Secondary | ICD-10-CM | POA: Diagnosis present

## 2021-12-05 DIAGNOSIS — N186 End stage renal disease: Secondary | ICD-10-CM | POA: Diagnosis present

## 2021-12-05 DIAGNOSIS — R079 Chest pain, unspecified: Secondary | ICD-10-CM | POA: Diagnosis present

## 2021-12-05 DIAGNOSIS — I5033 Acute on chronic diastolic (congestive) heart failure: Secondary | ICD-10-CM | POA: Diagnosis present

## 2021-12-05 DIAGNOSIS — Z91199 Patient's noncompliance with other medical treatment and regimen due to unspecified reason: Secondary | ICD-10-CM | POA: Diagnosis not present

## 2021-12-05 DIAGNOSIS — Z9981 Dependence on supplemental oxygen: Secondary | ICD-10-CM | POA: Diagnosis not present

## 2021-12-05 DIAGNOSIS — Z992 Dependence on renal dialysis: Secondary | ICD-10-CM | POA: Diagnosis not present

## 2021-12-05 DIAGNOSIS — Z8674 Personal history of sudden cardiac arrest: Secondary | ICD-10-CM | POA: Diagnosis not present

## 2021-12-05 DIAGNOSIS — K219 Gastro-esophageal reflux disease without esophagitis: Secondary | ICD-10-CM | POA: Diagnosis present

## 2021-12-05 DIAGNOSIS — I132 Hypertensive heart and chronic kidney disease with heart failure and with stage 5 chronic kidney disease, or end stage renal disease: Secondary | ICD-10-CM | POA: Diagnosis present

## 2021-12-05 DIAGNOSIS — G8929 Other chronic pain: Secondary | ICD-10-CM | POA: Diagnosis present

## 2021-12-05 DIAGNOSIS — R778 Other specified abnormalities of plasma proteins: Secondary | ICD-10-CM | POA: Diagnosis present

## 2021-12-05 DIAGNOSIS — Z7901 Long term (current) use of anticoagulants: Secondary | ICD-10-CM | POA: Diagnosis not present

## 2021-12-05 DIAGNOSIS — I083 Combined rheumatic disorders of mitral, aortic and tricuspid valves: Secondary | ICD-10-CM | POA: Diagnosis present

## 2021-12-05 DIAGNOSIS — E440 Moderate protein-calorie malnutrition: Secondary | ICD-10-CM | POA: Diagnosis present

## 2021-12-05 HISTORY — PX: IR THORACENTESIS ASP PLEURAL SPACE W/IMG GUIDE: IMG5380

## 2021-12-05 LAB — TROPONIN I (HIGH SENSITIVITY): Troponin I (High Sensitivity): 66 ng/L — ABNORMAL HIGH (ref <18)

## 2021-12-05 MED ORDER — RENA-VITE PO TABS
1.0000 | ORAL_TABLET | Freq: Every day | ORAL | Status: DC
  Administered 2021-12-05: 1 via ORAL
  Filled 2021-12-05: qty 1

## 2021-12-05 MED ORDER — NITROGLYCERIN 0.4 MG SL SUBL: 0.4000 mg | SUBLINGUAL_TABLET | SUBLINGUAL | Status: DC | PRN

## 2021-12-05 MED ORDER — APIXABAN 2.5 MG PO TABS
2.5000 mg | ORAL_TABLET | Freq: Two times a day (BID) | ORAL | Status: DC
  Administered 2021-12-05 – 2021-12-06 (×2): 2.5 mg via ORAL
  Filled 2021-12-05 (×3): qty 1

## 2021-12-05 MED ORDER — CINACALCET HCL 30 MG PO TABS
60.0000 mg | ORAL_TABLET | Freq: Every evening | ORAL | Status: DC
  Administered 2021-12-05: 60 mg via ORAL
  Filled 2021-12-05: qty 2

## 2021-12-05 MED ORDER — OXYCODONE HCL 5 MG PO TABS: 10.0000 mg | ORAL_TABLET | Freq: Four times a day (QID) | ORAL | Status: DC | PRN

## 2021-12-05 MED ORDER — ALBUTEROL SULFATE (2.5 MG/3ML) 0.083% IN NEBU: 3.0000 mL | INHALATION_SOLUTION | Freq: Two times a day (BID) | RESPIRATORY_TRACT | Status: DC | PRN

## 2021-12-05 MED ORDER — LIDOCAINE HCL 1 % IJ SOLN
INTRAMUSCULAR | Status: AC
  Filled 2021-12-05: qty 20

## 2021-12-05 MED ORDER — SEVELAMER CARBONATE 800 MG PO TABS
800.0000 mg | ORAL_TABLET | Freq: Three times a day (TID) | ORAL | Status: DC
  Administered 2021-12-05 (×3): 800 mg via ORAL
  Filled 2021-12-05 (×4): qty 1

## 2021-12-05 MED ORDER — DILTIAZEM HCL ER COATED BEADS 180 MG PO CP24
300.0000 mg | ORAL_CAPSULE | Freq: Every morning | ORAL | Status: DC
  Administered 2021-12-05: 300 mg via ORAL
  Filled 2021-12-05 (×2): qty 1

## 2021-12-05 MED ORDER — ENSURE ENLIVE PO LIQD: 237.0000 mL | Freq: Two times a day (BID) | ORAL | Status: DC

## 2021-12-05 MED ORDER — GUAIFENESIN 100 MG/5ML PO LIQD
5.0000 mL | ORAL | Status: DC | PRN
  Administered 2021-12-05 – 2021-12-06 (×2): 5 mL via ORAL
  Filled 2021-12-05 (×2): qty 10

## 2021-12-05 MED ORDER — OXYCODONE HCL 5 MG PO TABS
10.0000 mg | ORAL_TABLET | Freq: Four times a day (QID) | ORAL | Status: DC | PRN
  Administered 2021-12-05: 10 mg via ORAL
  Filled 2021-12-05: qty 2

## 2021-12-05 MED ORDER — LIDOCAINE HCL 1 % IJ SOLN
INTRAMUSCULAR | Status: AC
  Administered 2021-12-05: 10 mL
  Filled 2021-12-05: qty 20

## 2021-12-05 MED ORDER — SERTRALINE HCL 50 MG PO TABS
25.0000 mg | ORAL_TABLET | Freq: Every day | ORAL | Status: DC
  Filled 2021-12-05: qty 1

## 2021-12-05 MED ORDER — HYDROMORPHONE HCL 1 MG/ML IJ SOLN
1.0000 mg | INTRAMUSCULAR | Status: DC | PRN
  Filled 2021-12-05: qty 1

## 2021-12-05 MED ORDER — OXYCODONE HCL 5 MG PO TABS
5.0000 mg | ORAL_TABLET | Freq: Four times a day (QID) | ORAL | Status: DC | PRN
  Administered 2021-12-05: 5 mg via ORAL
  Filled 2021-12-05: qty 1

## 2021-12-05 MED ORDER — FLUTICASONE FUROATE-VILANTEROL 100-25 MCG/ACT IN AEPB
1.0000 | INHALATION_SPRAY | Freq: Every day | RESPIRATORY_TRACT | Status: DC | PRN
  Filled 2021-12-05: qty 28

## 2021-12-05 MED ORDER — HYDROMORPHONE HCL 1 MG/ML IJ SOLN
1.0000 mg | Freq: Three times a day (TID) | INTRAMUSCULAR | Status: DC | PRN
  Administered 2021-12-05 – 2021-12-06 (×2): 1 mg via INTRAVENOUS
  Filled 2021-12-05: qty 1

## 2021-12-05 NOTE — Hospital Course (Addendum)
Thomas Robinson was admitted to the hospital with the working diagnosis of chest pain. ? ?61 yo male with the past medical history of ESRD on HD MWF, paroxysmal atrial fibrillation, heart failure and pulmonary embolism who presented with chest pain and worsening dyspnea. Reported 48 hours of chest pain associated with dyspnea. Recent hospitalization 03/17 to 11/23/21 for volume overload. On his initial physical examination his blood pressure was 138/79, HR 99, RR 24 and 02 saturation 93%, lungs with no wheezing, decreased breath sounds at the right side, heart with S1 and S2 present and rhythmic, abdomen soft and no lower extremity edema.  ? ?Na 137, K 4,2 CL 97, bicarbonate 25, glucose 117 bun 47 cr 5,8 ?Wbc 6,4 hgb 9,5 hct 29,6 plt 216  ? ?Chest radiograph with mild cardiomegaly, with right pleural effusion, moderate.  ?CT chest with no pulmonary embolism, loculated right pleural effusion, bilateral ground glass opacities, right lower lobe atelectasis. Small pericardial effusion. Chronic left sided rib fractures. Stable aneurysmal dilatation of ascending aorta 4,3 cm.  ? ?EKG 84 bpm, left axis deviation, normal intervals, sinus rhythm with pac, no significant ST segment changes, negative T wave V1 to V2, positive LVH.  ? ?04/06 right sided thoracentesis 1.1 L removed.  ?

## 2021-12-05 NOTE — ED Notes (Signed)
Transported to IR 

## 2021-12-05 NOTE — Assessment & Plan Note (Signed)
Continue with antiacid therapy.  ?

## 2021-12-05 NOTE — Assessment & Plan Note (Signed)
No clinical signs of exacerbation  

## 2021-12-05 NOTE — TOC Initial Note (Signed)
Transition of Care (TOC) - Initial/Assessment Note  ? ? ?Patient Details  ?Name: Thomas Robinson ?MRN: [id number] ?Date of Birth: 1961-02-08 ? ?Transition of Care [facility]) CM/SW Contact:    ?Ninfa Meeker, RN ?Phone Number: ?12/05/2021, 12:10 PM ? ?Clinical Narrative:        ?Transition of Care screening note           ? ?Transition of Care [facility]) Department has reviewed patient and no TOC needs have been identified at this time. We will continue to monitor patient advancement through Interdisciplinary progressions and if new patient needs arise, please place a consult.  ? ?  ?  ? ? ?Patient Goals and CMS Choice ?  ?  ?  ? ?Expected Discharge Plan and Services ?  ?  ?  ?  ?  ?                ?  ?  ?  ?  ?  ?  ?  ?  ?  ?  ? ?Prior Living Arrangements/Services ?  ?  ?  ?       ?  ?  ?  ?  ? ?Activities of Daily Living ?  ?  ? ?Permission Sought/Granted ?  ?  ?   ?   ?   ?   ? ?Emotional Assessment ?  ?  ?  ?  ?  ?  ? ?Admission diagnosis:  Elevated troponin [R77.8] ?End stage renal disease on dialysis ([place]) [N18.6, Z99.2] ?Chest pain [R07.9] ?Chest pain, unspecified type [R07.9] ?Patient Active Problem List  ? Diagnosis Date Noted  ? Protein-calorie malnutrition, severe ([place]) 11/20/2021  ? Depression with anxiety 11/15/2021  ? Pleural effusion, bilateral 10/15/2021  ? Left rib fracture 10/14/2021  ? Hypoxemia 10/14/2021  ? Pleural effusion, right 10/14/2021  ? Paroxysmal atrial fibrillation ([place]) 09/29/2021  ? Acute on chronic respiratory failure with hypoxemia due to volume overload due to noncompliance with hemodialysis  09/19/2021  ? Persistent atrial fibrillation ([place]) 09/19/2021  ? Pulmonary emboli ([place]) 09/19/2021  ? Cachexia ([place]) 09/19/2021  ? Chronic respiratory failure with hypoxia ([place]) 09/07/2021  ? Fever 09/07/2021  ? Hypotension 09/07/2021  ? Acute on chronic diastolic (congestive) heart failure ([place]) 08/26/2021  ? Fluid overload 08/25/2021  ? Atrial fibrillation with RVR ([place]) 08/25/2021  ? Sacral  pressure sore 08/12/2021  ? Shortness of breath 07/16/2021  ? Balanitis 07/16/2021  ? Vomiting   ? Gastritis and gastroduodenitis   ? Grade III hemorrhoids   ? Decubitus ulcer of coccyx, stage 2 ([place]) 07/08/2021  ? Prolonged QT interval 07/08/2021  ? Hematochezia   ? Atrial flutter ([place])   ? Pressure injury of skin 07/02/2021  ? Abnormal chest CT 06/30/2021  ? Recurrent pleural effusion on right 06/30/2021  ? SIRS (systemic inflammatory response syndrome) ([place]) 06/30/2021  ? Acute hypoxemic respiratory failure ([place]) 05/18/2021  ? Bronchitis 05/18/2021  ? Preoperative cardiovascular examination 03/06/2021  ? CHF (congestive heart failure) ([place]) 02/17/2021  ? Cellulitis of arm, left 02/01/2021  ? Cellulitis of left lower limb 02/01/2021  ? Cellulitis, unspecified 02/01/2021  ? Left arm swelling 01/25/2021  ? Acute and chronic respiratory failure with hypoxia ([place]) 01/13/2021  ? Concussion with no loss of consciousness 01/12/2021  ? Laceration without foreign body of left forearm, initial encounter 01/12/2021  ? Age-related physical debility 09/20/2020  ? Allergy, unspecified, initial encounter 09/20/2020  ? Nausea 09/20/2020  ? Pain, unspecified 09/20/2020  ? Pruritus, unspecified 09/20/2020  ?  S/P cardiac cath 08/27/2020  ? Acute encephalopathy 08/26/2020  ? Peripheral neuropathy 08/26/2020  ? Elevated troponin 08/25/2020  ? Arthritis   ? Asthma   ? Cataract   ? Cerebral infarction due to thrombosis of cerebral artery ([place])   ? History of CVA (cerebrovascular accident)   ? Chronic kidney disease   ? Dyspnea   ? Hyperlipidemia   ? Hypertension   ? Kidney failure   ? Restless leg syndrome   ? Degenerative lumbar spinal stenosis 06/26/2020  ? Diastolic dysfunction 49/67/5916  ? Renovascular hypertension 06/22/2020  ? Demand ischemia ([place]) 06/18/2020  ? LVH (left ventricular hypertrophy) due to hypertensive disease, with heart failure ([place]) 06/18/2020  ? Myofascial pain syndrome 06/12/2020  ? Encounter for removal of  sutures 03/29/2020  ? History of fusion of cervical spine 03/26/2020  ? Non-compliance with renal dialysis 02/08/2020  ? Pulmonary edema 01/23/2020  ? Cervical disc herniation 01/04/2020  ? Tachycardia 01/04/2020  ? SOB (shortness of breath)   ? Hypoxia 12/30/2019  ? Chronic low back pain 11/24/2019  ? Degeneration of lumbar intervertebral disc 11/24/2019  ? Lumbar radiculopathy 11/24/2019  ? Left shoulder pain 11/11/2019  ? Anaphylactic shock, unspecified, sequela 06/10/2019  ? Rib fractures 06/05/2019  ? Fall at home, initial encounter 06/04/2019  ? Right rib fracture 06/04/2019  ? Lung contusion 06/04/2019  ? Acute respiratory failure with hypoxia ([place]) 06/04/2019  ? GERD without esophagitis 06/04/2019  ? Chronic diastolic (congestive) heart failure ([place]) 06/04/2019  ? Fall 06/04/2019  ? Thoracic ascending aortic aneurysm 06/04/2019  ? Acute on chronic heart failure with preserved ejection fraction (HFpEF) ([place]) 05/16/2019  ? Carpal tunnel syndrome of right wrist 10/05/2018  ? Ulnar neuropathy 10/05/2018  ? Neuritis of right ulnar nerve 09/13/2018  ? Pain in joint of right elbow 09/13/2018  ? Chest pain 04/13/2018  ? Chronic diastolic heart failure ([place]) 04/13/2018  ? Acute on chronic diastolic CHF (congestive heart failure) ([place]) 04/13/2018  ? Atypical chest pain 02/16/2018  ? ESRD on dialysis on HD MWF-but noncompliant 02/16/2018  ? End stage renal disease ([place]) 02/16/2018  ? CAP (community acquired pneumonia) 10/23/2017  ? Asthma, chronic, unspecified asthma severity, with acute exacerbation 10/23/2017  ? Respiratory failure, acute ([place]) 10/23/2017  ? Pulmonary hypertension ([place]) 10/23/2017  ? Iron deficiency anemia, unspecified 09/09/2017  ? Gram-negative sepsis, unspecified ([place]) 09/05/2017  ? Encounter for immunization 07/08/2017  ? Coagulation defect, unspecified ([place]) 06/04/2017  ? Hypokalemia 06/04/2017  ? Other hyperlipidemia 06/04/2017  ? Secondary hyperparathyroidism of renal origin ([place]) 06/04/2017   ? Macrocytic anemia 05/24/2017  ? Uremia 05/24/2017  ? Metabolic acidosis 38/46/6599  ? Hypertensive urgency 05/24/2017  ? Acute kidney injury superimposed on CKD ([place]) 05/24/2017  ? CKD (chronic kidney disease), stage V ([place]) 05/24/2017  ? GI bleeding 05/24/2017  ? Anemia of chronic disease 05/24/2017  ? Stroke [facility]) 2018  ? AKI (acute kidney injury) ([place]) 04/10/2015  ? History of completed stroke   ? Essential hypertension 04/09/2015  ? ?PCP:  [name], PA ?Pharmacy:   ?[id number] [place] [facility] [address]?Phone: [phone]: [phone]? ?[facility] [place] [place] [place] [address] ?[address] ?[address], [address] [place]?Phone: [phone]: [phone]? ? ? ? ?Social Determinants of Health (SDOH) Interventions ?  ? ?Readmission Risk Interventions ? ?  11/20/2021  ? 11:09 AM 09/08/2021  ?  2:46 PM 01/15/2021  ?  2:13 PM  ?Readmission Risk Prevention Plan  ?Transportation Screening Complete Complete Complete  ?Medication Review Press photographer) Complete Complete Complete  ?PCP or Specialist appointment within 3-5 days of discharge Complete    ?[facility] or Home Care Consult Complete Complete Complete  ?SW Recovery Care/Counseling Consult Complete Complete Complete  ?Palliative Care Screening Complete Not Applicable Not Applicable  ?[place] Not Applicable Not Applicable Not Applicable  ? ? ? ?

## 2021-12-05 NOTE — Assessment & Plan Note (Signed)
Chronic recurrent loculated right transudate pleural effusion.  ? ?Patient had thoracentesis, 1100 ml fluid removed with good toleration.  ?Dyspnea has improved. ?Continue pain control with oral oxycodone.  ?Follow up chest film with improvement and no pneumothorax.  ? ? ?

## 2021-12-05 NOTE — Discharge Summary (Signed)
?Physician Discharge Summary ?  ?Patient: Thomas Robinson MRN: [id number] DOB: [birth year]  ?Admit date:     12/04/2021  ?Discharge date: 12/05/21  ?Discharge Physician: Jimmy Picket Indya Oliveria  ? ?PCP: [name], PA  ? ?Recommendations at discharge:  ? ? Patient with improvement in dyspnea.  ?Chest pain is controlled with analgesics.  ? ?Discharge Diagnoses: ?Principal Problem: ?  Chest pain ?Active Problems: ?  Acute on chronic heart failure with preserved ejection fraction (HFpEF) ([place]) ?  ESRD on dialysis on HD MWF-but noncompliant ?  Recurrent right pleural effusion ?  Paroxysmal atrial fibrillation (HCC) ?  Chronic respiratory failure with hypoxia (HCC) ?  GERD (gastroesophageal reflux disease) ?  COPD (chronic obstructive pulmonary disease) ([place]) ?  Moderate protein-calorie malnutrition ([place]) ? ?Resolved Problems: ?  * No resolved hospital problems. * ? ?Hospital Course: ?Mr. Mccauley was admitted to the hospital with the working diagnosis of chest pain. ? ?61 yo male with the past medical history of ESRD on HD MWF, paroxysmal atrial fibrillation, heart failure and pulmonary embolism who presented with chest pain and worsening dyspnea. Reported 48 hours of chest pain associated with dyspnea. Recent hospitalization 03/17 to 11/23/21 for volume overload. On his initial physical examination his blood pressure was 138/79, HR 99, RR 24 and 02 saturation 93%, lungs with no wheezing, decreased breath sounds at the right side, heart with S1 and S2 present and rhythmic, abdomen soft and no lower extremity edema.  ? ?Na 137, K 4,2 CL 97, bicarbonate 25, glucose 117 bun 47 cr 5,8 ?Wbc 6,4 hgb 9,5 hct 29,6 plt 216  ? ?Chest radiograph with mild cardiomegaly, with right pleural effusion, moderate.  ?CT chest with no pulmonary embolism, loculated right pleural effusion, bilateral ground glass opacities, right lower lobe atelectasis. Small pericardial effusion. Chronic left sided rib fractures. Stable aneurysmal dilatation of  ascending aorta 4,3 cm.  ? ?EKG 84 bpm, left axis deviation, normal intervals, sinus rhythm with pac, no significant ST segment changes, negative T wave V1 to V2, positive LVH.  ? ?04/06 right sided thoracentesis 1.1 L removed.  ? ?Assessment and Plan: ?* Chest pain ?Chest pain improved after thoracentesis.  ?Patient has ruled out for acute coronary syndrome. ?Troponin has remained stable 54,65,03,54  ?EKG with no changes.  ? ? ?Acute on chronic heart failure with preserved ejection fraction (HFpEF) ([place]) ?Dyspnea has improved. ?Patient with no edema at his lower extremities.  ?02 saturation is 95% on 4 L regular nasal cannula. ? ?Plan to continue with ultrafiltration tomorrow at the outpatient HD unit.  ? ? ?ESRD on dialysis on HD MWF-but noncompliant ?Electrolytes are stable on admission. ?His dyspnea has improved after thoracentesis. ? ?Plan to continue with renal replacement therapy tomorrow in the outpatient unit.  ? ?Recurrent right pleural effusion ?Chronic recurrent loculated right transudate pleural effusion.  ? ?Patient had thoracentesis, 1100 ml fluid removed with good toleration.  ?Dyspnea has improved. ?Continue pain control with oral oxycodone.  ?Follow up chest film with improvement and no pneumothorax.  ? ? ? ?Paroxysmal atrial fibrillation (HCC) ?Heart rate has been controlled, continue with diltiazem. ?Anticoagulation with apixaban.  ? ?Moderate protein-calorie malnutrition ([place]) ?Continue with nutritional supplement.  ? ?COPD (chronic obstructive pulmonary disease) ([place]) ?No clinical signs of exacerbation.  ? ?GERD (gastroesophageal reflux disease) ?Continue with antiacid therapy.  ? ?Chronic respiratory failure with hypoxia ([place]) ?Continue supplemental 02 per [place], currently using 4 L with no signs of respiratory distress.  ? ?Follow up chest film with improvement  in pleural effusion, no pneumothorax.  ? ? ? ? ?  ? ? ?Consultants: none  ?Procedures performed: none   ?Disposition: Home ?Diet  recommendation:  ?Cardiac diet ?DISCHARGE MEDICATION: ? ? ?Discharge Exam: ?There were no vitals filed for this visit. ?BP (!) 116/33 (BP Location: Left Leg)   Pulse 68   Temp 98 ?F (36.7 ?C) (Oral)   Resp 20   SpO2 95%  ? ?Patient with improvement in dyspnea, chest pain improved with analgesics ? ?Neurology awake and alert ?ENT with mild pallor ?Cardiovascular with S1 and S2 present and rhythmic with no gallops, positive systolic murmur at the apex. ?Respiratory with no wheezing or rales ?Abdomen not distended ?No lower extremity edema  ? ?Condition at discharge: stable ? ?The results of significant diagnostics from this hospitalization (including imaging, microbiology, ancillary and laboratory) are listed below for reference.  ? ?Imaging Studies: ?DG Chest 1 View ? ?Result Date: 12/05/2021 ?CLINICAL DATA:  Status post thoracentesis EXAM: CHEST  1 VIEW COMPARISON:  Previous studies including the examination of 12/04/2021 FINDINGS: There is interval decrease in right pleural effusion. There is no pneumothorax. Transverse diameter of heart is increased. Central pulmonary vessels are prominent. Increased interstitial markings are seen in the parahilar regions and lower lung fields on both sides suggesting pulmonary edema. Patchy infiltrates are seen in the lower lung fields, more so on the right side suggesting atelectasis/pneumonia. There is minimal blunting of left lateral CP angle. Tip of right IJ dialysis catheter is seen in the superior vena cava. There is previous surgical fusion in the lower cervical spine. IMPRESSION: There is decrease in right pleural effusion. Still, there is small to moderate amount of residual right pleural effusion. There is no pneumothorax. Cardiomegaly. CHF. Infiltrates are seen in the lower lung fields, more so on the right side suggesting atelectasis/pneumonia. Electronically Signed   By: Elmer Picker M.D.   On: 12/05/2021 11:57  ? ?DG Chest 1 View ? ?Result Date:  11/16/2021 ?CLINICAL DATA:  Chest pain. EXAM: CHEST  1 VIEW COMPARISON:  11/15/2021 FINDINGS: Right chest wall dialysis catheter noted with tips at the cavoatrial junction. Stable cardiomediastinal contours. There is a moderate volume right pleural effusion which appears unchanged from previous exam. Diffusely increased interstitial markings are identified bilaterally compatible with interstitial edema. No signs of pneumothorax following thoracentesis. IMPRESSION: 1. Persistent moderate volume right pleural effusion. No pneumothorax following thoracentesis. 2. Moderate diffuse pulmonary edema. Electronically Signed   By: Kerby Moors M.D.   On: 11/16/2021 11:18  ? ?DG Chest 2 View ? ?Result Date: 11/15/2021 ?CLINICAL DATA:  Dyspnea, chest pain, weakness, onset of symptoms following dialysis yesterday, increased shortness of breath this morning, vomiting EXAM: CHEST - 2 VIEW COMPARISON:  10/17/2021 FINDINGS: RIGHT jugular catheter with tip projecting over SVC. Enlargement of cardiac silhouette with pulmonary vascular congestion. Diffuse BILATERAL pulmonary infiltrates question pulmonary edema though in multifocal infection not completely excluded. Persistent BILATERAL pleural effusions and basilar atelectasis greater on RIGHT. Atherosclerotic calcification aorta. No pneumothorax. Prior cervical spine fusion IMPRESSION: Enlargement of cardiac silhouette with pulmonary vascular congestion and probable pulmonary edema though multifocal infection not completely excluded. Bibasilar effusions and atelectasis RIGHT greater than LEFT. Electronically Signed   By: Lavonia Dana M.D.   On: 11/15/2021 16:56  ? ?CT Chest W Contrast ? ?Result Date: 11/15/2021 ?CLINICAL DATA:  Pneumonia, complication suspected, xray done Chest pain.  Shortness of breath. EXAM: CT CHEST WITH CONTRAST TECHNIQUE: Multidetector CT imaging of the chest was performed during intravenous contrast  administration. RADIATION DOSE REDUCTION: This exam was  performed according to the departmental dose-optimization program which includes automated exposure control, adjustment of the mA and/or kV according to patient size and/or use of iterative reconstruction technique. CONTRAST:  1

## 2021-12-05 NOTE — Assessment & Plan Note (Signed)
Continue with nutritional supplement.  

## 2021-12-05 NOTE — Assessment & Plan Note (Addendum)
Dyspnea has improved. ?Patient with no edema at his lower extremities.  ?02 saturation is 95% on 4 L regular nasal cannula. ? ?Plan to continue with ultrafiltration tomorrow at the outpatient HD unit.  ? ?

## 2021-12-05 NOTE — Progress Notes (Signed)
Patient continue to have chest pain despite oral analgesics, oxycodone 5 mg. ?Plan to cancel discharge today, and increase oxycodone to 10 mg, back up pain control with hydromorphone IV (avoid morphine in the setting of ESRD). ? ?Plan for possible discharge tomorrow, to continue outpatient hemodialysis.  ?

## 2021-12-05 NOTE — Progress Notes (Signed)
Contacted by attending regarding pt being d/c in the am in order to receive out-pt HD at clinic. Pt receives out-pt HD at [facility] on MWF. Pt arrives at 11:00 for 11:15 chair time. Debra at clinic advised that MD is going to plan d/c in the morning in order for pt to arrive at clinic by 11:00 for treatment. Spoke to pt via phone. Pt advised of MD's plan for early d/c in order to get to out-pt clinic for HD. Pt said "ok" then ended the call. Unable to discuss with pt how he would get from hospital to clinic. Pt will need to leave hospital by 10:15-10:30 in order to get to clinic by 11:00. MD and pt's RN provided this info via secure chat.  ? ?Thomas Robinson ?Renal Navigator ?[phone] ?

## 2021-12-05 NOTE — Procedures (Signed)
PROCEDURE SUMMARY: ? ?Successful US guided R therapeutic thoracentesis. ?Yielded 1.1 L of serosanguinous fluid. ?Pt tolerated procedure well. ?No immediate complications. ? ?Specimen not sent for labs. ?CXR ordered. ? ?EBL < 1 mL ? ?Tyson Alias, AGNP ?12/05/2021 ?11:30 AM ? ?   ?

## 2021-12-05 NOTE — Assessment & Plan Note (Addendum)
Continue supplemental 02 per [place], currently using 4 L with no signs of respiratory distress.  ? ?Follow up chest film with improvement in pleural effusion, no pneumothorax.  ?

## 2021-12-06 ENCOUNTER — Other Ambulatory Visit (HOSPITAL_COMMUNITY): Payer: Self-pay

## 2021-12-06 MED ORDER — OXYCODONE HCL 5 MG PO TABS: 5.0000 mg | ORAL_TABLET | Freq: Four times a day (QID) | ORAL | Status: DC | PRN

## 2021-12-06 MED ORDER — OXYCODONE HCL 5 MG PO TABS
5.0000 mg | ORAL_TABLET | Freq: Four times a day (QID) | ORAL | 0 refills | Status: AC | PRN
  Filled 2021-12-06: qty 10, 3d supply, fill #0

## 2021-12-06 MED ORDER — CHLORHEXIDINE GLUCONATE CLOTH 2 % EX PADS: 6.0000 | MEDICATED_PAD | Freq: Every day | CUTANEOUS | Status: DC

## 2021-12-06 NOTE — Progress Notes (Signed)
Pt d/c early this am in order to receive out-pt HD at clinic. Per RN, pt arranged transportation for d/c. Contacted [facility] and spoke to Hilda Blades to make clinic aware that pt will d/c this am in order to arrive by 11:00. Debra informed navigator that pt called clinic to say he would not be d/c in time to make appt today and requested appt for tomorrow. Clinic provided pt appt for treatment tomorrow.  ? ?Melven Sartorius ?Renal Navigator ?[phone] ?

## 2021-12-06 NOTE — Progress Notes (Signed)
Patient is feeling better, he has not used IV analgesics overnight and chest pain has improved along with dyspnea.  ? ?BP 117/70 (BP Location: Left Arm)   Pulse 68   Temp 98 ?F (36.7 ?C)   Resp 18   SpO2 93%  ? ?Neurology awake and alert ?ENT with no mild pallor ?Cardiovascular with S1 and S2 present and rhythmic, positive systolic murmur at the apex. ?Lungs with no wheezing ?Abdomen soft ?No lower extremity edema. ? ?Right pleural effusion. ?SP thoracentesis. ?Chest pain and dyspnea have improved. ? ?Plan to discharge home today and continue hemodialysis as outpatient.  ?

## 2021-12-10 ENCOUNTER — Other Ambulatory Visit: Payer: Self-pay

## 2021-12-10 ENCOUNTER — Encounter (HOSPITAL_COMMUNITY): Payer: Self-pay

## 2021-12-10 ENCOUNTER — Emergency Department (HOSPITAL_COMMUNITY)
Admission: EM | Admit: 2021-12-10 | Discharge: 2021-12-11 | Disposition: A | Discharge status: Home / Self Care | Payer: Medicare Other | Attending: Student | Admitting: Student

## 2021-12-10 DIAGNOSIS — Z5321 Procedure and treatment not carried out due to patient leaving prior to being seen by health care provider: Secondary | ICD-10-CM | POA: Insufficient documentation

## 2021-12-10 DIAGNOSIS — R0602 Shortness of breath: Secondary | ICD-10-CM | POA: Diagnosis present

## 2021-12-10 DIAGNOSIS — R778 Other specified abnormalities of plasma proteins: Secondary | ICD-10-CM | POA: Diagnosis not present

## 2021-12-10 DIAGNOSIS — R079 Chest pain, unspecified: Secondary | ICD-10-CM | POA: Diagnosis not present

## 2021-12-10 LAB — COMPREHENSIVE METABOLIC PANEL
ALT: 8 U/L (ref 0–44)
AST: 12 U/L — ABNORMAL LOW (ref 15–41)
Albumin: 3.1 g/dL — ABNORMAL LOW (ref 3.5–5.0)
Alkaline Phosphatase: 100 U/L (ref 38–126)
Anion gap: 18 — ABNORMAL HIGH (ref 5–15)
BUN: 77 mg/dL — ABNORMAL HIGH (ref 8–23)
CO2: 23 mmol/L (ref 22–32)
Calcium: 9.1 mg/dL (ref 8.9–10.3)
Chloride: 99 mmol/L (ref 98–111)
Creatinine, Ser: 9.13 mg/dL — ABNORMAL HIGH (ref 0.61–1.24)
GFR, Estimated: 6 mL/min — ABNORMAL LOW (ref >60)
Glucose, Bld: 80 mg/dL (ref 70–99)
Potassium: 5.9 mmol/L — ABNORMAL HIGH (ref 3.5–5.1)
Sodium: 140 mmol/L (ref 135–145)
Total Bilirubin: 1.1 mg/dL (ref 0.3–1.2)
Total Protein: 6.7 g/dL (ref 6.5–8.1)

## 2021-12-10 LAB — CBC WITH DIFFERENTIAL/PLATELET
Abs Immature Granulocytes: 0.01 10*3/uL (ref 0.00–0.07)
Basophils Absolute: 0.1 10*3/uL (ref 0.0–0.1)
Basophils Relative: 1 %
Eosinophils Absolute: 0.1 10*3/uL (ref 0.0–0.5)
Eosinophils Relative: 3 %
HCT: 29.8 % — ABNORMAL LOW (ref 39.0–52.0)
Hemoglobin: 9.2 g/dL — ABNORMAL LOW (ref 13.0–17.0)
Immature Granulocytes: 0 %
Lymphocytes Relative: 11 %
Lymphs Abs: 0.6 10*3/uL — ABNORMAL LOW (ref 0.7–4.0)
MCH: 31.1 pg (ref 26.0–34.0)
MCHC: 30.9 g/dL (ref 30.0–36.0)
MCV: 100.7 fL — ABNORMAL HIGH (ref 80.0–100.0)
Monocytes Absolute: 0.5 10*3/uL (ref 0.1–1.0)
Monocytes Relative: 10 %
Neutro Abs: 3.9 10*3/uL (ref 1.7–7.7)
Neutrophils Relative %: 75 %
Platelets: 153 10*3/uL (ref 150–400)
RBC: 2.96 MIL/uL — ABNORMAL LOW (ref 4.22–5.81)
RDW: 17.1 % — ABNORMAL HIGH (ref 11.5–15.5)
WBC: 5.2 10*3/uL (ref 4.0–10.5)
nRBC: 0 % (ref 0.0–0.2)

## 2021-12-10 LAB — LIPASE, BLOOD: Lipase: 45 U/L (ref 11–51)

## 2021-12-10 LAB — PROTIME-INR
INR: 1.2 (ref 0.8–1.2)
Prothrombin Time: 15.3 seconds — ABNORMAL HIGH (ref 11.4–15.2)

## 2021-12-10 LAB — TROPONIN I (HIGH SENSITIVITY): Troponin I (High Sensitivity): 137 ng/L (ref <18)

## 2021-12-10 NOTE — ED Provider Triage Note (Signed)
Emergency Medicine Provider Triage Evaluation Note ? ?Thomas Robinson , a 61 y.o. male  was evaluated in triage.  Pt complains of with history of hypertension, ESRD, on dialysis Monday/Wednesday/Friday last 1 Monday who presents with concern for central to left-sided chest pain with associated shortness of breath.  Usually wears 2 L of oxygen at baseline has increased it to 5 L at home.  Requiring 3 L here to maintain his saturations.. ? ?Review of Systems  ?Positive: Shortness of breath, chest pain, fatigue, nausea ?Negative: Palpitations, syncope ? ?Physical Exam  ?BP (!) 170/91 (BP Location: Left Arm)   Pulse (!) 101   Temp 98.1 ?F (36.7 ?C) (Oral)   Resp 18   Ht '5\' 8"'$  (1.727 m)   Wt 44.8 kg   SpO2 92%   BMI 15.02 kg/m?  ?Gen:   Awake, no distress  ?Resp:  Increased work of breathing with accessory muscle use and tachypnea.  Lungs are clear to auscultation bilaterally ?MSK:   Moves extremities without difficulty  ?Other:  1+ pitting pedal edema bilaterally. ? ?Medical Decision Making  ?Medically screening exam initiated at 10:28 PM.  Appropriate orders placed.  Thomas Robinson was informed that the remainder of the evaluation will be completed by another provider, this initial triage assessment does not replace that evaluation, and the importance of remaining in the ED until their evaluation is complete. ? ?This chart was dictated using voice recognition software, Dragon. Despite the best efforts of this provider to proofread and correct errors, errors may still occur which can change documentation meaning. ? ?  ?Emeline Darling, PA-C ?12/10/21 2249 ? ?

## 2021-12-10 NOTE — ED Triage Notes (Signed)
Pt having chest pain and sob since last night. Wears 2lnc at baseline bumped up to [place]. Nasuea and headache also. Pt has cardiac history. Pt is dialysis pt. Received 324 asa and nitro with no improvement.Pt hypertensive with ems bp 194/118 hr 108 92% 5lnc. ?

## 2021-12-11 NOTE — ED Notes (Signed)
Called patient x4 for recheck vitalsigns no answer ?

## 2021-12-11 NOTE — ED Provider Notes (Signed)
I initially evaluated this patient in the triage setting with concern for shortness of breath and chest pain.  I was informed by RN that patient left after being seen by triage provider.  Troponin significantly elevated to 137; I was informed by RN and ED RN attempted to contact patient by phone, unsuccessfully, to recommend return to the emergency department. ? ?Concern for MI, no answer x 2 to phone numbers in chart. Unable to contact patient regarding critical result.  ? ?This chart was dictated using voice recognition software, Dragon. Despite the best efforts of this provider to proofread and correct errors, errors may still occur which can change documentation meaning. ? ?  ?Emeline Darling, PA-C ?12/11/21 0032 ? ?  ?Quintella Reichert, MD ?12/11/21 0159 ? ?

## 2021-12-11 NOTE — ED Notes (Signed)
Called and spoke with patient. He was made aware one of his cardiac labs came back abnormal and our ED Provider felt he should come back. He said he left because he just could not wait, I told him to ask for me if he returns and I will try to get him back ASAP.  ?

## 2021-12-11 NOTE — ED Notes (Signed)
Attempted to call patient to come back to ED.  No answer to either phone numbers.   ?

## 2021-12-14 ENCOUNTER — Encounter (HOSPITAL_COMMUNITY): Payer: Self-pay | Admitting: *Deleted

## 2021-12-14 ENCOUNTER — Other Ambulatory Visit: Payer: Self-pay

## 2021-12-14 ENCOUNTER — Emergency Department (HOSPITAL_COMMUNITY): Payer: Medicare Other

## 2021-12-14 ENCOUNTER — Inpatient Hospital Stay (HOSPITAL_COMMUNITY)
Admission: EM | Admit: 2021-12-14 | Discharge: 2021-12-20 | DRG: 291 | Disposition: A | Discharge status: Home Health | Payer: Medicare Other | Attending: Internal Medicine | Admitting: Internal Medicine

## 2021-12-14 DIAGNOSIS — G2581 Restless legs syndrome: Secondary | ICD-10-CM | POA: Diagnosis present

## 2021-12-14 DIAGNOSIS — J9601 Acute respiratory failure with hypoxia: Secondary | ICD-10-CM | POA: Diagnosis present

## 2021-12-14 DIAGNOSIS — E43 Unspecified severe protein-calorie malnutrition: Secondary | ICD-10-CM | POA: Diagnosis present

## 2021-12-14 DIAGNOSIS — Z20822 Contact with and (suspected) exposure to covid-19: Secondary | ICD-10-CM | POA: Diagnosis present

## 2021-12-14 DIAGNOSIS — J9621 Acute and chronic respiratory failure with hypoxia: Secondary | ICD-10-CM | POA: Diagnosis present

## 2021-12-14 DIAGNOSIS — Z681 Body mass index (BMI) 19 or less, adult: Secondary | ICD-10-CM

## 2021-12-14 DIAGNOSIS — I132 Hypertensive heart and chronic kidney disease with heart failure and with stage 5 chronic kidney disease, or end stage renal disease: Secondary | ICD-10-CM | POA: Diagnosis not present

## 2021-12-14 DIAGNOSIS — D631 Anemia in chronic kidney disease: Secondary | ICD-10-CM | POA: Diagnosis present

## 2021-12-14 DIAGNOSIS — N2581 Secondary hyperparathyroidism of renal origin: Secondary | ICD-10-CM | POA: Diagnosis present

## 2021-12-14 DIAGNOSIS — E875 Hyperkalemia: Secondary | ICD-10-CM | POA: Diagnosis not present

## 2021-12-14 DIAGNOSIS — Z91158 Patient's noncompliance with renal dialysis for other reason: Secondary | ICD-10-CM

## 2021-12-14 DIAGNOSIS — J90 Pleural effusion, not elsewhere classified: Secondary | ICD-10-CM | POA: Diagnosis present

## 2021-12-14 DIAGNOSIS — I5033 Acute on chronic diastolic (congestive) heart failure: Secondary | ICD-10-CM | POA: Diagnosis not present

## 2021-12-14 DIAGNOSIS — I272 Pulmonary hypertension, unspecified: Secondary | ICD-10-CM | POA: Diagnosis present

## 2021-12-14 DIAGNOSIS — R112 Nausea with vomiting, unspecified: Secondary | ICD-10-CM | POA: Diagnosis not present

## 2021-12-14 DIAGNOSIS — R64 Cachexia: Secondary | ICD-10-CM | POA: Diagnosis present

## 2021-12-14 DIAGNOSIS — J918 Pleural effusion in other conditions classified elsewhere: Secondary | ICD-10-CM | POA: Diagnosis present

## 2021-12-14 DIAGNOSIS — Z809 Family history of malignant neoplasm, unspecified: Secondary | ICD-10-CM

## 2021-12-14 DIAGNOSIS — R079 Chest pain, unspecified: Secondary | ICD-10-CM | POA: Diagnosis present

## 2021-12-14 DIAGNOSIS — K219 Gastro-esophageal reflux disease without esophagitis: Secondary | ICD-10-CM | POA: Diagnosis present

## 2021-12-14 DIAGNOSIS — I440 Atrioventricular block, first degree: Secondary | ICD-10-CM | POA: Diagnosis present

## 2021-12-14 DIAGNOSIS — N186 End stage renal disease: Secondary | ICD-10-CM | POA: Diagnosis present

## 2021-12-14 DIAGNOSIS — E785 Hyperlipidemia, unspecified: Secondary | ICD-10-CM | POA: Diagnosis present

## 2021-12-14 DIAGNOSIS — Z8673 Personal history of transient ischemic attack (TIA), and cerebral infarction without residual deficits: Secondary | ICD-10-CM

## 2021-12-14 DIAGNOSIS — Z981 Arthrodesis status: Secondary | ICD-10-CM

## 2021-12-14 DIAGNOSIS — D638 Anemia in other chronic diseases classified elsewhere: Secondary | ICD-10-CM | POA: Diagnosis present

## 2021-12-14 DIAGNOSIS — J810 Acute pulmonary edema: Secondary | ICD-10-CM

## 2021-12-14 DIAGNOSIS — I480 Paroxysmal atrial fibrillation: Secondary | ICD-10-CM | POA: Diagnosis present

## 2021-12-14 DIAGNOSIS — Z992 Dependence on renal dialysis: Secondary | ICD-10-CM

## 2021-12-14 DIAGNOSIS — J449 Chronic obstructive pulmonary disease, unspecified: Secondary | ICD-10-CM | POA: Diagnosis present

## 2021-12-14 DIAGNOSIS — Z9981 Dependence on supplemental oxygen: Secondary | ICD-10-CM

## 2021-12-14 DIAGNOSIS — Z7901 Long term (current) use of anticoagulants: Secondary | ICD-10-CM

## 2021-12-14 LAB — HEPATIC FUNCTION PANEL
ALT: 8 U/L (ref 0–44)
AST: 19 U/L (ref 15–41)
Albumin: 3.1 g/dL — ABNORMAL LOW (ref 3.5–5.0)
Alkaline Phosphatase: 90 U/L (ref 38–126)
Bilirubin, Direct: 0.2 mg/dL (ref 0.0–0.2)
Indirect Bilirubin: 1.2 mg/dL — ABNORMAL HIGH (ref 0.3–0.9)
Total Bilirubin: 1.4 mg/dL — ABNORMAL HIGH (ref 0.3–1.2)
Total Protein: 6.8 g/dL (ref 6.5–8.1)

## 2021-12-14 LAB — BRAIN NATRIURETIC PEPTIDE: B Natriuretic Peptide: 2487 pg/mL — ABNORMAL HIGH (ref 0.0–100.0)

## 2021-12-14 LAB — RESP PANEL BY RT-PCR (FLU A&B, COVID) ARPGX2
Influenza A by PCR: NEGATIVE
Influenza B by PCR: NEGATIVE
SARS Coronavirus 2 by RT PCR: NEGATIVE

## 2021-12-14 LAB — CBC
HCT: 29.1 % — ABNORMAL LOW (ref 39.0–52.0)
Hemoglobin: 9 g/dL — ABNORMAL LOW (ref 13.0–17.0)
MCH: 31.4 pg (ref 26.0–34.0)
MCHC: 30.9 g/dL (ref 30.0–36.0)
MCV: 101.4 fL — ABNORMAL HIGH (ref 80.0–100.0)
Platelets: 156 10*3/uL (ref 150–400)
RBC: 2.87 MIL/uL — ABNORMAL LOW (ref 4.22–5.81)
RDW: 16.5 % — ABNORMAL HIGH (ref 11.5–15.5)
WBC: 4.3 10*3/uL (ref 4.0–10.5)
nRBC: 0 % (ref 0.0–0.2)

## 2021-12-14 LAB — BASIC METABOLIC PANEL
Anion gap: 17 — ABNORMAL HIGH (ref 5–15)
BUN: 63 mg/dL — ABNORMAL HIGH (ref 8–23)
CO2: 21 mmol/L — ABNORMAL LOW (ref 22–32)
Calcium: 8.9 mg/dL (ref 8.9–10.3)
Chloride: 100 mmol/L (ref 98–111)
Creatinine, Ser: 8.72 mg/dL — ABNORMAL HIGH (ref 0.61–1.24)
GFR, Estimated: 6 mL/min — ABNORMAL LOW (ref >60)
Glucose, Bld: 130 mg/dL — ABNORMAL HIGH (ref 70–99)
Potassium: 5.6 mmol/L — ABNORMAL HIGH (ref 3.5–5.1)
Sodium: 138 mmol/L (ref 135–145)

## 2021-12-14 LAB — TROPONIN I (HIGH SENSITIVITY)
Troponin I (High Sensitivity): 120 ng/L (ref <18)
Troponin I (High Sensitivity): 133 ng/L (ref <18)

## 2021-12-14 MED ORDER — ACETAMINOPHEN 650 MG RE SUPP: 650.0000 mg | Freq: Four times a day (QID) | RECTAL | Status: DC | PRN

## 2021-12-14 MED ORDER — CHLORHEXIDINE GLUCONATE CLOTH 2 % EX PADS
6.0000 | MEDICATED_PAD | Freq: Every day | CUTANEOUS | Status: DC
  Administered 2021-12-15: 6 via TOPICAL

## 2021-12-14 MED ORDER — SENNOSIDES-DOCUSATE SODIUM 8.6-50 MG PO TABS: 1.0000 | ORAL_TABLET | Freq: Every evening | ORAL | Status: DC | PRN

## 2021-12-14 MED ORDER — OXYCODONE HCL 5 MG PO TABS
5.0000 mg | ORAL_TABLET | Freq: Four times a day (QID) | ORAL | Status: DC | PRN
  Administered 2021-12-14: 5 mg via ORAL
  Filled 2021-12-14: qty 1

## 2021-12-14 MED ORDER — SODIUM CHLORIDE 0.9% FLUSH
3.0000 mL | Freq: Two times a day (BID) | INTRAVENOUS | Status: DC
  Administered 2021-12-15 – 2021-12-20 (×11): 3 mL via INTRAVENOUS

## 2021-12-14 MED ORDER — SEVELAMER CARBONATE 800 MG PO TABS
800.0000 mg | ORAL_TABLET | Freq: Three times a day (TID) | ORAL | Status: DC
  Administered 2021-12-15 – 2021-12-20 (×10): 800 mg via ORAL
  Filled 2021-12-14 (×11): qty 1

## 2021-12-14 MED ORDER — ALBUTEROL SULFATE (2.5 MG/3ML) 0.083% IN NEBU
2.5000 mg | INHALATION_SOLUTION | RESPIRATORY_TRACT | Status: DC | PRN
  Administered 2021-12-15: 2.5 mg via RESPIRATORY_TRACT
  Filled 2021-12-14: qty 3

## 2021-12-14 MED ORDER — ENSURE ENLIVE PO LIQD
237.0000 mL | Freq: Two times a day (BID) | ORAL | Status: DC
  Administered 2021-12-16: 237 mL via ORAL

## 2021-12-14 MED ORDER — APIXABAN 2.5 MG PO TABS
2.5000 mg | ORAL_TABLET | Freq: Two times a day (BID) | ORAL | Status: DC
  Filled 2021-12-14: qty 1

## 2021-12-14 MED ORDER — FENTANYL CITRATE PF 50 MCG/ML IJ SOSY
50.0000 ug | PREFILLED_SYRINGE | Freq: Once | INTRAMUSCULAR | Status: AC
  Administered 2021-12-14: 50 ug via INTRAVENOUS
  Filled 2021-12-14: qty 1

## 2021-12-14 MED ORDER — ACETAMINOPHEN 325 MG PO TABS: 650.0000 mg | ORAL_TABLET | Freq: Four times a day (QID) | ORAL | Status: DC | PRN

## 2021-12-14 MED ORDER — CINACALCET HCL 30 MG PO TABS
60.0000 mg | ORAL_TABLET | Freq: Every evening | ORAL | Status: DC
  Administered 2021-12-15 – 2021-12-18 (×4): 60 mg via ORAL
  Filled 2021-12-14 (×6): qty 2

## 2021-12-14 MED ORDER — DILTIAZEM HCL ER COATED BEADS 180 MG PO CP24
300.0000 mg | ORAL_CAPSULE | Freq: Every morning | ORAL | Status: DC
  Administered 2021-12-15 – 2021-12-20 (×6): 300 mg via ORAL
  Filled 2021-12-14 (×6): qty 1

## 2021-12-14 MED ORDER — SERTRALINE HCL 50 MG PO TABS
25.0000 mg | ORAL_TABLET | Freq: Every day | ORAL | Status: DC
  Administered 2021-12-15 – 2021-12-20 (×6): 25 mg via ORAL
  Filled 2021-12-14 (×6): qty 1

## 2021-12-14 MED ORDER — FLUTICASONE FUROATE-VILANTEROL 100-25 MCG/ACT IN AEPB
1.0000 | INHALATION_SPRAY | Freq: Every day | RESPIRATORY_TRACT | Status: DC
  Administered 2021-12-15 – 2021-12-19 (×4): 1 via RESPIRATORY_TRACT
  Filled 2021-12-14: qty 28

## 2021-12-14 NOTE — Assessment & Plan Note (Signed)
Recurrent right pleural effusion ?Admitted with dyspnea due to volume overload with BNP 2487 and increased O2 requirement from 4 L via [place] as high as 8 L [place] to maintain SPO2 >92%.  CXR shows acute on chronic pulmonary edema and increasing right pleural effusion. ?-Dialysis planned tonight per nephrology for volume removal ?-Monitor strict I/O's and daily weights ?-If no significant improvement, consider repeat thoracentesis ?

## 2021-12-14 NOTE — ED Triage Notes (Signed)
Pt states chest pain since dialysis yesterday.  Did not receive full dialysis. When [facility] arrived, pt's sats were 84 on 4.5 L [place].  Sats increased to 99 on 8L. ? ?179/90 bp ?99% 8L ?HR 90 ?RR 26 ? ?Given 324 asa and 0.4 nitro with no relief. ?

## 2021-12-14 NOTE — Assessment & Plan Note (Signed)
Chest pressure secondary to pulmonary edema and pleural effusion.  Troponin persistently mildly elevated in setting of acute on chronic HFpEF and ESRD.  EKG without acute ischemic changes.  No significant coronary artery disease on prior cardiac work-up.  Doubt ACS at this time. ?

## 2021-12-14 NOTE — Consult Note (Signed)
Renal Service ?Consult Note ?[facility] ? ?[name] ?12/14/2021 ?[name], MD ?Requesting Physician: Dr. Roslynn Amble ? ?Reason for Consult: ESRD pt w/ SOB, pulm edema ?HPI: The patient is a 61 y.o. year-old w/ hx of CVA, chronic pain, COPD, chronic resp failure on home O2, GIB, ESRD on HD MWF, HL, HTN who presented to ED w/ c/o chest pain since yesterday, and SOB/ breathing difficulty. Only had 1-2 hrs HD yesterday. EMS found SpO2 84% on 4.5 L [place], ^'d to 99% on 8L [place], BP 170/ 90, HR 90, RR 26. Got sl ntg and asa w/o relief. In ED BP's 165/ 70, HR 75, afeb. K+ 5.6 CO2 21 BUN 64  cr 8.7, Ca 8.9 alb 3.1. CXR showed pulm edema superimposed upon chronic R effusion. Asked to see for ESRD.   ? ?Pt seen in ED.  Last admit 4/5- 4/6. SP 6 admissions since Sep 01, 2021. Lives w/ his relative(s) in [place]. He drives or family drives him to HD. NO tobacco/ etoh. Using R TDC. States he is working on getting the aVF to mature.   ? ?ROS - denies CP, no joint pain, no HA, no blurry vision, no rash, no diarrhea, no nausea/ vomiting, no dysuria, no difficulty voiding ? ? ?Past Medical History  ?Past Medical History:  ?Diagnosis Date  ? Anaphylactic shock, unspecified, sequela 06/10/2019  ? Aortic insufficiency   ? Aortic stenosis   ? Arthritis   ? Asthma, chronic, unspecified asthma severity, with acute exacerbation 10/23/2017  ? CAP (community acquired pneumonia) 10/23/2017  ? Carpal tunnel syndrome of right wrist 10/05/2018  ? Cataract   ? right - removed by surgery  ? Cerebral infarction due to thrombosis of cerebral artery ([place])   ? Cervical disc herniation 01/04/2020  ? Chronic diastolic heart failure ([place]) 04/13/2018  ? Chronic low back pain 11/24/2019  ? Constipation   ? COPD (chronic obstructive pulmonary disease) ([place])   ? Cough   ? chronic cough  ? Encounter for immunization 07/08/2017  ? ESRD on hemodialysis ([place]) 02/16/2018  ? Fall 06/04/2019  ? GERD (gastroesophageal reflux disease) 06/04/2019  ? GI  bleeding 05/24/2017  ? Gram-negative sepsis, unspecified ([place]) 09/05/2017  ? History of fusion of cervical spine 03/26/2020  ? Hyperlipidemia   ? Hypertension   ? Hypokalemia 06/04/2017  ? Hypotension   ? Iron deficiency anemia, unspecified 09/09/2017  ? Left shoulder pain 11/11/2019  ? Lumbar radiculopathy 11/24/2019  ? Lung contusion 06/04/2019  ? LVH (left ventricular hypertrophy) due to hypertensive disease, with heart failure ([place]) 06/18/2020  ? Macrocytic anemia 05/24/2017  ? Mitral regurgitation   ? Moderate protein-calorie malnutrition ([place]) 06/06/2017  ? Myofascial pain syndrome 06/12/2020  ? Neuritis of right ulnar nerve 09/13/2018  ? Non-compliance with renal dialysis 02/08/2020  ? Oxygen deficiency 12/30/2019  ? O2 sats on RA 87% at PAT appt   ? Pain in joint of right elbow 09/13/2018  ? Paroxysmal atrial flutter ([place])   ? PEA (Pulseless electrical activity) ([place])   ? after sedation in 07/2021  ? Pulmonary hypertension ([place])   ? Renovascular hypertension 06/22/2020  ? Restless leg syndrome   ? Rib fractures 06/2019  ? Right  ? S/P cardiac cath 08/27/2020  ? normal coronary arteries.  ? Secondary hyperparathyroidism of renal origin ([place]) 06/04/2017  ? Thoracic ascending aortic aneurysm ([place])   ? Tricuspid regurgitation   ? Ulnar neuropathy 10/05/2018  ? Volume overload 10/23/2017  ? ?Past Surgical History  ?  Past Surgical History:  ?Procedure Laterality Date  ? A/V FISTULAGRAM N/A 01/01/2021  ? Procedure: A/V FISTULAGRAM;  Surgeon: Serafina Mitchell, MD;  Location: [place] [facility];  Service: Cardiovascular;  Laterality: N/A;  ? A/V FISTULAGRAM N/A 01/15/2021  ? Procedure: A/V FISTULAGRAM;  Surgeon: Serafina Mitchell, MD;  Location: [place] [facility];  Service: Cardiovascular;  Laterality: N/A;  ? ANTERIOR CERVICAL DECOMP/DISCECTOMY FUSION N/A 01/04/2020  ? Procedure: ANTERIOR CERVICAL DECOMPRESSION/DISCECTOMY FUSION CERVICAL FIVE THROUGH SEVEN;  Surgeon: Melina Schools, MD;  Location: [place];  Service:  Orthopedics;  Laterality: N/A;  3 hrs  ? AV FISTULA PLACEMENT Left 05/28/2017  ? Procedure: LEFT ARM ARTERIOVENOUS (AV) FISTULA CREATION;  Surgeon: Conrad Moody, MD;  Location: [place];  Service: Vascular;  Laterality: Left;  ? AV FISTULA PLACEMENT Left 02/02/2021  ? Procedure: Debride left forearm, ligation of left upper arm Aretiovenous fistula;  Surgeon: Elam Dutch, MD;  Location: [facility] OR;  Service: Vascular;  Laterality: Left;  ? AV FISTULA PLACEMENT Right 04/04/2021  ? Procedure: RIGHT ARTERIOVENOUS (AV) FISTULA CREATION;  Surgeon: Serafina Mitchell, MD;  Location: [place];  Service: Vascular;  Laterality: Right;  ? BIOPSY  07/10/2021  ? Procedure: BIOPSY;  Surgeon: Lavena Bullion, DO;  Location: [place] [facility];  Service: Gastroenterology;;  ? COLONOSCOPY WITH PROPOFOL N/A 07/10/2021  ? Procedure: COLONOSCOPY WITH PROPOFOL;  Surgeon: Lavena Bullion, DO;  Location: [place] [facility];  Service: Gastroenterology;  Laterality: N/A;  ? ESOPHAGOGASTRODUODENOSCOPY (EGD) WITH PROPOFOL N/A 07/10/2021  ? Procedure: ESOPHAGOGASTRODUODENOSCOPY (EGD) WITH PROPOFOL;  Surgeon: Lavena Bullion, DO;  Location: [place] [facility];  Service: Gastroenterology;  Laterality: N/A;  ? EYE SURGERY Right 06/02/2019  ? Cataract removed  ? FRACTURE SURGERY    ? INSERTION OF DIALYSIS CATHETER Right 02/02/2021  ? Procedure: INSERTION OF Right Internal jugular TUNNELED  DIALYSIS CATHETER;  Surgeon: Elam Dutch, MD;  Location: [facility] OR;  Service: Vascular;  Laterality: Right;  ? IR DIALY SHUNT INTRO NEEDLE/INTRACATH INITIAL W/IMG LEFT Left 09/12/2020  ? IR FLUORO GUIDE CV LINE RIGHT  05/25/2017  ? IR FLUORO GUIDE CV LINE RIGHT  07/17/2021  ? IR THORACENTESIS ASP PLEURAL SPACE W/IMG GUIDE  09/11/2021  ? IR THORACENTESIS ASP PLEURAL SPACE W/IMG GUIDE  12/05/2021  ? IR US GUIDE VASC ACCESS LEFT  09/12/2020  ? IR US GUIDE VASC ACCESS RIGHT  05/25/2017  ? IR US GUIDE VASC ACCESS RIGHT  07/17/2021  ? LIGATION OF ARTERIOVENOUS  FISTULA Left 01/10/2021  ? Procedure:  LIGATION OF LEFT ARM RADIOCEPHALIC FISTULA;  Surgeon: Serafina Mitchell, MD;  Location: [place];  Service: Vascular;  Laterality: Left;  ? PERIPHERAL VASCULAR BALLOON ANGIOPLASTY Left 01/15/2021  ? Procedure: PERIPHERAL VASCULAR BALLOON ANGIOPLASTY;  Surgeon: Serafina Mitchell, MD;  Location: [place] [facility];  Service: Cardiovascular;  Laterality: Left;  AVF  ? REVISON OF ARTERIOVENOUS FISTULA Left 07/18/2019  ? Procedure: REVISION PLICATION OF RADIOCEPHALIC ARTERIOVENOUS FISTULA LEFT ARM;  Surgeon: Angelia Mould, MD;  Location: [facility] OR;  Service: Vascular;  Laterality: Left;  ? REVISON OF ARTERIOVENOUS FISTULA Left 01/10/2021  ? Procedure: CONVERSION TO BRACHIOCEPHALIC ARTERIOVENOUS FISTULA;  Surgeon: Serafina Mitchell, MD;  Location: [place];  Service: Vascular;  Laterality: Left;  ? RINOPLASTY    ? ?Family History  ?Family History  ?Problem Relation Age of Onset  ? Cancer Mother   ? ?Social History  reports that he has never smoked. He has never used smokeless tobacco. He reports that  he does not currently use alcohol. He reports that he does not use drugs. ?Allergies  ?Allergies  ?Allergen Reactions  ? Vancomycin Shortness Of Breath and Itching  ? ?Home medications ?Prior to Admission medications   ?Medication Sig Start Date End Date Taking? Authorizing Provider  ?acetaminophen (TYLENOL) 500 MG tablet Take 1,000 mg by mouth every 6 (six) hours as needed for moderate pain (pain).    [provider]  ?albuterol (VENTOLIN HFA) 108 (90 Base) MCG/ACT inhaler Inhale 2 puffs into the lungs 2 (two) times daily as needed for shortness of breath or wheezing. 06/01/21   Oswald Hillock, MD  ?apixaban (ELIQUIS) 2.5 MG TABS tablet Take 1 tablet (2.5 mg total) by mouth 2 (two) times daily. 11/23/21 12/23/21  Arrien, Jimmy Picket, MD  ?B Complex Vitamins (B-COMPLEX/B-12) TABS Take 1 tablet by mouth daily. 11/25/21   [provider]  ?Cholecalciferol (VITAMIN D3 PO) Take 1 capsule by mouth See admin  instructions. Twice a week - [place]    [provider]  ?cinacalcet (SENSIPAR) 60 MG tablet Take 1 tablet (60 mg total) by mouth every evening. 06/01/21   Oswald Hillock, MD  ?Cyanocobalamin (VITAMIN

## 2021-12-14 NOTE — H&P (Signed)
?History and Physical  ? ? ?[name] JWJ:[id number] DOB: [birth year] DOA: 12/14/2021 ? ?PCP: [name], PA  ?Patient coming from: Home via EMS ? ?I have personally briefly reviewed patient's old medical records in [place] ? ?Chief Complaint: Shortness of breath ? ?HPI: ?Thomas Robinson is a 61 y.o. male with medical history significant for ESRD on MWF HD, HFpEF (EF 60-65%), chronic respiratory failure with hypoxia on 4 L O2 via [place], paroxysmal atrial fibrillation on Eliquis, recurrent right pleural effusion, COPD, history of CVA, HTN, anemia of chronic kidney disease who presented to the ED for evaluation of dyspnea. ? ?Patient recently admitted 12/04/2021-12/05/2021 for chest pressure and dyspnea secondary to acute on chronic HFpEF, volume overload, recurrent right pleural effusion.  He underwent right-sided thoracentesis with 1.1 L removed with improvement in his symptoms.  He was discharged to home to continue outpatient HD. ? ?Patient reports recurrent dyspnea and chest pressure beginning yesterday (4/14), worsening today.  He was only able to complete 1-2 hours HD yesterday.  He called EMS and on their arrival SPO2 was 84% on 4.5 L via [place].  O2 was increased to 8 L via [place] with improvement in SPO2 to 99%.  He was brought to the ED for further evaluation. ? ?He states that he is trying to limit the amount of fluids he takes in.  He has seen some swelling in his toes and feet.  He is feeling generally weak and states that he uses a walker to ambulate for safety.  He says he still makes urine. ? ?ED Course  Labs/Imaging on admission: I have personally reviewed following labs and imaging studies. ? ?Initial vitals showed BP 176/102, pulse 83, RR 21, temp 97.8 ?F, SPO2 97% on 5 L [place]. ? ?Labs show sodium 138, potassium 5.6, bicarb 21, BUN 63, creatinine 8.72, serum glucose 130, WBC 4.3, hemoglobin 9.0, platelets 156,000, troponin 120, AST 19, ALT 8, alk phos 90, total bilirubin 1.4, BNP  2487. ? ?Portable chest x-ray shows increased pulmonary edema and enlarging right pleural effusion.  Right IJ dialysis catheter seen in place. ? ?EDP discussed with on-call nephrology who recommended admission for volume removal with dialysis.  The hospitalist service was consulted to admit for further evaluation and management. ? ?Review of Systems: All systems reviewed and are negative except as documented in history of present illness above. ? ? ?Past Medical History:  ?Diagnosis Date  ? Anaphylactic shock, unspecified, sequela 06/10/2019  ? Aortic insufficiency   ? Aortic stenosis   ? Arthritis   ? Asthma, chronic, unspecified asthma severity, with acute exacerbation 10/23/2017  ? CAP (community acquired pneumonia) 10/23/2017  ? Carpal tunnel syndrome of right wrist 10/05/2018  ? Cataract   ? right - removed by surgery  ? Cerebral infarction due to thrombosis of cerebral artery ([place])   ? Cervical disc herniation 01/04/2020  ? Chronic diastolic heart failure ([place]) 04/13/2018  ? Chronic low back pain 11/24/2019  ? Constipation   ? COPD (chronic obstructive pulmonary disease) ([place])   ? Cough   ? chronic cough  ? Encounter for immunization 07/08/2017  ? ESRD on hemodialysis ([place]) 02/16/2018  ? Fall 06/04/2019  ? GERD (gastroesophageal reflux disease) 06/04/2019  ? GI bleeding 05/24/2017  ? Gram-negative sepsis, unspecified ([place]) 09/05/2017  ? History of fusion of cervical spine 03/26/2020  ? Hyperlipidemia   ? Hypertension   ? Hypotension   ? Iron deficiency anemia, unspecified 09/09/2017  ? Left shoulder pain 11/11/2019  ?  Lumbar radiculopathy 11/24/2019  ? Lung contusion 06/04/2019  ? LVH (left ventricular hypertrophy) due to hypertensive disease, with heart failure ([place]) 06/18/2020  ? Macrocytic anemia 05/24/2017  ? Mitral regurgitation   ? Moderate protein-calorie malnutrition ([place]) 06/06/2017  ? Myofascial pain syndrome 06/12/2020  ? Neuritis of right ulnar nerve 09/13/2018  ? Non-compliance with renal dialysis  02/08/2020  ? Oxygen deficiency 12/30/2019  ? O2 sats on RA 87% at PAT appt   ? Pain in joint of right elbow 09/13/2018  ? Paroxysmal atrial flutter ([place])   ? PEA (Pulseless electrical activity) ([place])   ? after sedation in 07/2021  ? Pulmonary hypertension ([place])   ? Renovascular hypertension 06/22/2020  ? Restless leg syndrome   ? Rib fractures 06/2019  ? Right  ? S/P cardiac cath 08/27/2020  ? normal coronary arteries.  ? Secondary hyperparathyroidism of renal origin ([place]) 06/04/2017  ? Thoracic ascending aortic aneurysm ([place])   ? Tricuspid regurgitation   ? Ulnar neuropathy 10/05/2018  ? ? ?Past Surgical History:  ?Procedure Laterality Date  ? A/V FISTULAGRAM N/A 01/01/2021  ? Procedure: A/V FISTULAGRAM;  Surgeon: Serafina Mitchell, MD;  Location: [place] [facility];  Service: Cardiovascular;  Laterality: N/A;  ? A/V FISTULAGRAM N/A 01/15/2021  ? Procedure: A/V FISTULAGRAM;  Surgeon: Serafina Mitchell, MD;  Location: [place] [facility];  Service: Cardiovascular;  Laterality: N/A;  ? ANTERIOR CERVICAL DECOMP/DISCECTOMY FUSION N/A 01/04/2020  ? Procedure: ANTERIOR CERVICAL DECOMPRESSION/DISCECTOMY FUSION CERVICAL FIVE THROUGH SEVEN;  Surgeon: Melina Schools, MD;  Location: [place];  Service: Orthopedics;  Laterality: N/A;  3 hrs  ? AV FISTULA PLACEMENT Left 05/28/2017  ? Procedure: LEFT ARM ARTERIOVENOUS (AV) FISTULA CREATION;  Surgeon: Conrad [place], MD;  Location: [place];  Service: Vascular;  Laterality: Left;  ? AV FISTULA PLACEMENT Left 02/02/2021  ? Procedure: Debride left forearm, ligation of left upper arm Aretiovenous fistula;  Surgeon: Elam Dutch, MD;  Location: [facility] OR;  Service: Vascular;  Laterality: Left;  ? AV FISTULA PLACEMENT Right 04/04/2021  ? Procedure: RIGHT ARTERIOVENOUS (AV) FISTULA CREATION;  Surgeon: Serafina Mitchell, MD;  Location: [place];  Service: Vascular;  Laterality: Right;  ? BIOPSY  07/10/2021  ? Procedure: BIOPSY;  Surgeon: Lavena Bullion, DO;  Location: [place] [facility];  Service:  Gastroenterology;;  ? COLONOSCOPY WITH PROPOFOL N/A 07/10/2021  ? Procedure: COLONOSCOPY WITH PROPOFOL;  Surgeon: Lavena Bullion, DO;  Location: [place] [facility];  Service: Gastroenterology;  Laterality: N/A;  ? ESOPHAGOGASTRODUODENOSCOPY (EGD) WITH PROPOFOL N/A 07/10/2021  ? Procedure: ESOPHAGOGASTRODUODENOSCOPY (EGD) WITH PROPOFOL;  Surgeon: Lavena Bullion, DO;  Location: [place] [facility];  Service: Gastroenterology;  Laterality: N/A;  ? EYE SURGERY Right 06/02/2019  ? Cataract removed  ? FRACTURE SURGERY    ? INSERTION OF DIALYSIS CATHETER Right 02/02/2021  ? Procedure: INSERTION OF Right Internal jugular TUNNELED  DIALYSIS CATHETER;  Surgeon: Elam Dutch, MD;  Location: [facility] OR;  Service: Vascular;  Laterality: Right;  ? IR DIALY SHUNT INTRO NEEDLE/INTRACATH INITIAL W/IMG LEFT Left 09/12/2020  ? IR FLUORO GUIDE CV LINE RIGHT  05/25/2017  ? IR FLUORO GUIDE CV LINE RIGHT  07/17/2021  ? IR THORACENTESIS ASP PLEURAL SPACE W/IMG GUIDE  09/11/2021  ? IR THORACENTESIS ASP PLEURAL SPACE W/IMG GUIDE  12/05/2021  ? IR US GUIDE VASC ACCESS LEFT  09/12/2020  ? IR US GUIDE VASC ACCESS RIGHT  05/25/2017  ? IR US GUIDE VASC ACCESS RIGHT  07/17/2021  ? LIGATION OF ARTERIOVENOUS  FISTULA Left 01/10/2021  ? Procedure: LIGATION OF LEFT ARM RADIOCEPHALIC FISTULA;  Surgeon: Serafina Mitchell, MD;  Location: [place];  Service: Vascular;  Laterality: Left;  ? PERIPHERAL VASCULAR BALLOON ANGIOPLASTY Left 01/15/2021  ? Procedure: PERIPHERAL VASCULAR BALLOON ANGIOPLASTY;  Surgeon: Serafina Mitchell, MD;  Location: [place] [facility];  Service: Cardiovascular;  Laterality: Left;  AVF  ? REVISON OF ARTERIOVENOUS FISTULA Left 07/18/2019  ? Procedure: REVISION PLICATION OF RADIOCEPHALIC ARTERIOVENOUS FISTULA LEFT ARM;  Surgeon: Angelia Mould, MD;  Location: [facility] OR;  Service: Vascular;  Laterality: Left;  ? REVISON OF ARTERIOVENOUS FISTULA Left 01/10/2021  ? Procedure: CONVERSION TO BRACHIOCEPHALIC ARTERIOVENOUS FISTULA;  Surgeon: Serafina Mitchell,  MD;  Location: [place];  Service: Vascular;  Laterality: Left;  ? RINOPLASTY    ? ? ?Social History: ? reports that he has never smoked. He has never used smokeless tobacco. He reports that he does not currently use alcohol. He r

## 2021-12-14 NOTE — ED Notes (Signed)
Pt called out requesting pain medicine. RN explained to pt that she is working on getting in contact with the MD to order more pain medicine. RN explained to pt that he also has a bed upstairs and is working on giving report to get him moved to a more comfortable bed.  ?

## 2021-12-14 NOTE — Assessment & Plan Note (Signed)
Hemoglobin 9.0, at baseline. ?

## 2021-12-14 NOTE — Assessment & Plan Note (Signed)
In sinus rhythm on admission with controlled rate.  Continue diltiazem and Eliquis. ?

## 2021-12-14 NOTE — ED Notes (Signed)
Admitting MD paged 

## 2021-12-14 NOTE — ED Provider Notes (Signed)
?Thomas Robinson ?Provider Note ? ? ?CSN: [id number] ?Arrival date & time: 12/14/21  1458 ? ?  ? ?History ? ?Chief Complaint  ?Patient presents with  ? Chest Pain  ? ? ?Thomas Robinson is a 62 y.o. male. ? ?HPI ?Patient is a 61 year old male with a complex medical history including but not limited to hypertension, ESRD on HD Monday/Wednesday/Friday, CHF, respiratory failure with hypoxia, who presents to the emergency department due to chest pain and shortness of breath.  Symptoms started last night.  States that they have been persistent overnight.  He states he typically wears 3 L via nasal cannula chronically but has had increased to 5 L due to shortness of breath.  States that he was dialyzed about 1.5 hours yesterday but was unable to complete a full session due to his chest pain.  Also notes some trace pedal edema, cough, as well as an episode of vomiting this morning.  He called EMS this morning and his saturations were found to be 84% on 4.5 L via nasal cannula and he was briefly increased to 8 L with improvement to the high 90s.  He was given 324 of ASA as well as NTG without relief. ?  ? ?Home Medications ?Prior to Admission medications   ?Medication Sig Start Date End Date Taking? Authorizing Provider  ?acetaminophen (TYLENOL) 500 MG tablet Take 1,000 mg by mouth every 6 (six) hours as needed for moderate pain (pain).    [provider]  ?albuterol (VENTOLIN HFA) 108 (90 Base) MCG/ACT inhaler Inhale 2 puffs into the lungs 2 (two) times daily as needed for shortness of breath or wheezing. 06/01/21   Oswald Hillock, MD  ?apixaban (ELIQUIS) 2.5 MG TABS tablet Take 1 tablet (2.5 mg total) by mouth 2 (two) times daily. 11/23/21 12/23/21  Arrien, Jimmy Picket, MD  ?B Complex Vitamins (B-COMPLEX/B-12) TABS Take 1 tablet by mouth daily. 11/25/21   [provider]  ?Cholecalciferol (VITAMIN D3 PO) Take 1 capsule by mouth See admin instructions. Twice a week -  [place]    [provider]  ?cinacalcet (SENSIPAR) 60 MG tablet Take 1 tablet (60 mg total) by mouth every evening. 06/01/21   Oswald Hillock, MD  ?Cyanocobalamin (VITAMIN B-12 PO) Take 1 tablet by mouth daily.    [provider]  ?diltiazem (CARDIZEM CD) 300 MG 24 hr capsule Take 1 capsule (300 mg total) by mouth daily. ?Patient taking differently: Take 300 mg by mouth every morning. 09/21/21   Ghimire, Henreitta Leber, MD  ?ethyl chloride spray Apply 1 application. topically 3 (three) times a week. 11/02/21   [provider]  ?feeding supplement (ENSURE ENLIVE / ENSURE PLUS) LIQD Take 237 mLs by mouth 2 (two) times daily between meals. ?Patient taking differently: Take 237 mLs by mouth daily. 09/13/21   Elgergawy, Silver Huguenin, MD  ?fluticasone furoate-vilanterol (BREO ELLIPTA) 100-25 MCG/ACT AEPB Inhale 1 puff into the lungs daily as needed (For shortness of breath).    [provider]  ?multivitamin (RENA-VIT) TABS tablet Take 1 tablet by mouth every morning. 01/12/18   [provider]  ?nitroGLYCERIN (NITROSTAT) 0.4 MG SL tablet Place 1 tablet (0.4 mg total) under the tongue every 5 (five) minutes as needed for chest pain. 10/01/21   Aline August, MD  ?oxyCODONE (OXY IR/ROXICODONE) 5 MG immediate release tablet Take 1 tablet (5 mg total) by mouth every 6 (six) hours as needed for severe pain. 12/06/21   Arrien, Mauricio  Quillian Quince, MD  ?sertraline (ZOLOFT) 25 MG tablet Take 25 mg by mouth daily. 11/28/21   [provider]  ?sevelamer carbonate (RENVELA) 800 MG tablet Take 800 mg by mouth See admin instructions. Take one tablet (800 mg) by mouth three times daily - morning, evening and bedtime 05/31/21   [provider]  ?   ? ?Allergies    ?Vancomycin   ? ?Review of Systems   ?Review of Systems  ?All other systems reviewed and are negative. ?Ten systems reviewed and are negative for acute change, except as noted in the HPI.   ?Physical Exam ?Updated Vital  Signs ?BP (!) 164/136   Pulse 72   Temp 97.8 ?F (36.6 ?C) (Oral)   Resp (!) 23   Ht '5\' 8"'$  (1.727 m)   Wt 44.8 kg   SpO2 95%   BMI 15.02 kg/m?  ?Physical Exam ?Vitals and nursing note reviewed.  ?Constitutional:   ?   General: He is not in acute distress. ?   Appearance: Normal appearance. He is not ill-appearing, toxic-appearing or diaphoretic.  ?HENT:  ?   Head: Normocephalic and atraumatic.  ?   Right Ear: External ear normal.  ?   Left Ear: External ear normal.  ?   Nose: Nose normal.  ?   Mouth/Throat:  ?   Mouth: Mucous membranes are moist.  ?   Pharynx: Oropharynx is clear. No oropharyngeal exudate or posterior oropharyngeal erythema.  ?Eyes:  ?   Extraocular Movements: Extraocular movements intact.  ?Cardiovascular:  ?   Rate and Rhythm: Normal rate and regular rhythm.  ?   Pulses: Normal pulses.  ?   Heart sounds: Normal heart sounds. Heart sounds not distant. No murmur heard. ?No systolic murmur is present.  ?No diastolic murmur is present.  ?  No friction rub. No gallop. No S3 or S4 sounds.  ?Pulmonary:  ?   Effort: Pulmonary effort is normal. No tachypnea, accessory muscle usage or respiratory distress.  ?   Breath sounds: Normal breath sounds. No stridor. No decreased breath sounds, wheezing, rhonchi or rales.  ?Abdominal:  ?   General: Abdomen is flat.  ?   Tenderness: There is no abdominal tenderness.  ?Musculoskeletal:     ?   General: Normal range of motion.  ?   Cervical back: Normal range of motion and neck supple. No tenderness.  ?Skin: ?   General: Skin is warm and dry.  ?Neurological:  ?   General: No focal deficit present.  ?   Mental Status: He is alert and oriented to person, place, and time.  ?Psychiatric:     ?   Mood and Affect: Mood normal.     ?   Behavior: Behavior normal.  ? ? ?ED Results / Procedures / Treatments   ?Labs ?(all labs ordered are listed, but only abnormal results are displayed) ?Labs Reviewed  ?BASIC METABOLIC PANEL - Abnormal; Notable for the following  components:  ?    Result Value  ? Potassium 5.6 (*)   ? CO2 21 (*)   ? Glucose, Bld 130 (*)   ? BUN 63 (*)   ? Creatinine, Ser 8.72 (*)   ? GFR, Estimated 6 (*)   ? Anion gap 17 (*)   ? All other components within normal limits  ?CBC - Abnormal; Notable for the following components:  ? RBC 2.87 (*)   ? Hemoglobin 9.0 (*)   ? HCT 29.1 (*)   ? MCV 101.4 (*)   ?  RDW 16.5 (*)   ? All other components within normal limits  ?BRAIN NATRIURETIC PEPTIDE - Abnormal; Notable for the following components:  ? B Natriuretic Peptide 2,487.0 (*)   ? All other components within normal limits  ?HEPATIC FUNCTION PANEL - Abnormal; Notable for the following components:  ? Albumin 3.1 (*)   ? Total Bilirubin 1.4 (*)   ? Indirect Bilirubin 1.2 (*)   ? All other components within normal limits  ?TROPONIN I (HIGH SENSITIVITY) - Abnormal; Notable for the following components:  ? Troponin I (High Sensitivity) 120 (*)   ? All other components within normal limits  ?RESP PANEL BY RT-PCR (FLU A&B, COVID) [id number]  ?TROPONIN I (HIGH SENSITIVITY)  ? ?EKG ?EKG Interpretation ? ?Date/Time:  Saturday December 14 2021 15:18:07 EDT ?Ventricular Rate:  83 ?PR Interval:  225 ?QRS Duration: 100 ?QT Interval:  386 ?QTC Calculation: 299 ?R Axis:   52 ?Text Interpretation: Sinus rhythm Prolonged PR interval LAE, consider biatrial enlargement Abnormal R-wave progression, late transition Minimal ST elevation, anterolateral leads no acute changes from prior Confirmed by Madalyn Rob [id number]) on 12/14/2021 3:43:09 PM ? ?Radiology ?DG Chest Portable 1 View ? ?Result Date: 12/14/2021 ?CLINICAL DATA:  Chest pain since dialysis yesterday, short of breath EXAM: PORTABLE CHEST 1 VIEW COMPARISON:  12/05/2021 FINDINGS: Single frontal view of the chest demonstrates stable right internal jugular dialysis catheter. Widespread bilateral interstitial and ground-glass opacities are again noted, with increasing right pleural effusion. No pneumothorax. Cardiac silhouette remains  enlarged. No acute bony abnormalities. IMPRESSION: 1. Findings consistent with worsening pulmonary edema and enlarging right pleural effusion. Electronically Signed   By: Randa Ngo M.D.   On: 12/14/2021 17:02   ? ?Procedures ?[name]

## 2021-12-14 NOTE — Assessment & Plan Note (Signed)
K 5.6 on admission.  Dialysis planned for tonight. ?

## 2021-12-14 NOTE — Assessment & Plan Note (Signed)
No wheezing on admission.  Continue Breo and albuterol as needed. ?

## 2021-12-14 NOTE — Assessment & Plan Note (Signed)
Secondary to volume overload in setting of acute on chronic HFpEF, recurrent right pleural effusion, ESRD.  Increased O2 requirement from 4 L via [place] to as high as a liter via [place] to maintain SPO2 >92%. ?-Volume removal with dialysis ?-Wean supplemental oxygen as able ?

## 2021-12-14 NOTE — Hospital Course (Signed)
Thomas Robinson is a 61 y.o. male with medical history significant for ESRD on MWF HD, HFpEF (EF 60-65%), chronic respiratory failure with hypoxia on 4 L O2 via [place], paroxysmal atrial fibrillation on Eliquis, recurrent right pleural effusion, COPD, history of CVA, HTN, anemia of chronic kidney disease who is admitted with acute on chronic hypoxic respiratory failure due to volume overload in setting of HFpEF, recurrent right pleural effusion, and ESRD.  Nephrology consulted and plan for off schedule dialysis night of admission. ?

## 2021-12-14 NOTE — Assessment & Plan Note (Signed)
Nephrology consulted, plan for off schedule HD tonight. ?

## 2021-12-14 NOTE — ED Notes (Signed)
Pt requesting pain medicing. RN explained to pt he has tylenol ordered. Pt would like something stronger. RN paging admitting MD. ?

## 2021-12-15 ENCOUNTER — Observation Stay (HOSPITAL_COMMUNITY): Payer: Medicare Other

## 2021-12-15 DIAGNOSIS — R64 Cachexia: Secondary | ICD-10-CM | POA: Diagnosis present

## 2021-12-15 DIAGNOSIS — Z992 Dependence on renal dialysis: Secondary | ICD-10-CM | POA: Diagnosis not present

## 2021-12-15 DIAGNOSIS — Z20822 Contact with and (suspected) exposure to covid-19: Secondary | ICD-10-CM | POA: Diagnosis present

## 2021-12-15 DIAGNOSIS — I480 Paroxysmal atrial fibrillation: Secondary | ICD-10-CM | POA: Diagnosis present

## 2021-12-15 DIAGNOSIS — I440 Atrioventricular block, first degree: Secondary | ICD-10-CM | POA: Diagnosis present

## 2021-12-15 DIAGNOSIS — Z7901 Long term (current) use of anticoagulants: Secondary | ICD-10-CM | POA: Diagnosis not present

## 2021-12-15 DIAGNOSIS — E43 Unspecified severe protein-calorie malnutrition: Secondary | ICD-10-CM | POA: Diagnosis present

## 2021-12-15 DIAGNOSIS — D631 Anemia in chronic kidney disease: Secondary | ICD-10-CM | POA: Diagnosis present

## 2021-12-15 DIAGNOSIS — Z809 Family history of malignant neoplasm, unspecified: Secondary | ICD-10-CM | POA: Diagnosis not present

## 2021-12-15 DIAGNOSIS — J449 Chronic obstructive pulmonary disease, unspecified: Secondary | ICD-10-CM | POA: Diagnosis present

## 2021-12-15 DIAGNOSIS — N186 End stage renal disease: Secondary | ICD-10-CM | POA: Diagnosis present

## 2021-12-15 DIAGNOSIS — I5033 Acute on chronic diastolic (congestive) heart failure: Secondary | ICD-10-CM | POA: Diagnosis present

## 2021-12-15 DIAGNOSIS — J918 Pleural effusion in other conditions classified elsewhere: Secondary | ICD-10-CM | POA: Diagnosis present

## 2021-12-15 DIAGNOSIS — Z91158 Patient's noncompliance with renal dialysis for other reason: Secondary | ICD-10-CM | POA: Diagnosis not present

## 2021-12-15 DIAGNOSIS — J9621 Acute and chronic respiratory failure with hypoxia: Secondary | ICD-10-CM | POA: Diagnosis present

## 2021-12-15 DIAGNOSIS — Z8673 Personal history of transient ischemic attack (TIA), and cerebral infarction without residual deficits: Secondary | ICD-10-CM | POA: Diagnosis not present

## 2021-12-15 DIAGNOSIS — N2581 Secondary hyperparathyroidism of renal origin: Secondary | ICD-10-CM | POA: Diagnosis present

## 2021-12-15 DIAGNOSIS — I132 Hypertensive heart and chronic kidney disease with heart failure and with stage 5 chronic kidney disease, or end stage renal disease: Secondary | ICD-10-CM | POA: Diagnosis present

## 2021-12-15 DIAGNOSIS — Z681 Body mass index (BMI) 19 or less, adult: Secondary | ICD-10-CM | POA: Diagnosis not present

## 2021-12-15 DIAGNOSIS — K219 Gastro-esophageal reflux disease without esophagitis: Secondary | ICD-10-CM | POA: Diagnosis present

## 2021-12-15 DIAGNOSIS — I272 Pulmonary hypertension, unspecified: Secondary | ICD-10-CM | POA: Diagnosis present

## 2021-12-15 DIAGNOSIS — Z981 Arthrodesis status: Secondary | ICD-10-CM | POA: Diagnosis not present

## 2021-12-15 DIAGNOSIS — E875 Hyperkalemia: Secondary | ICD-10-CM | POA: Diagnosis present

## 2021-12-15 DIAGNOSIS — E785 Hyperlipidemia, unspecified: Secondary | ICD-10-CM | POA: Diagnosis present

## 2021-12-15 DIAGNOSIS — G2581 Restless legs syndrome: Secondary | ICD-10-CM | POA: Diagnosis present

## 2021-12-15 LAB — CBC
HCT: 26.1 % — ABNORMAL LOW (ref 39.0–52.0)
Hemoglobin: 8.4 g/dL — ABNORMAL LOW (ref 13.0–17.0)
MCH: 31.6 pg (ref 26.0–34.0)
MCHC: 32.2 g/dL (ref 30.0–36.0)
MCV: 98.1 fL (ref 80.0–100.0)
Platelets: 136 10*3/uL — ABNORMAL LOW (ref 150–400)
RBC: 2.66 MIL/uL — ABNORMAL LOW (ref 4.22–5.81)
RDW: 15.9 % — ABNORMAL HIGH (ref 11.5–15.5)
WBC: 4.4 10*3/uL (ref 4.0–10.5)
nRBC: 0 % (ref 0.0–0.2)

## 2021-12-15 LAB — BASIC METABOLIC PANEL
Anion gap: 12 (ref 5–15)
BUN: 25 mg/dL — ABNORMAL HIGH (ref 8–23)
CO2: 27 mmol/L (ref 22–32)
Calcium: 8.5 mg/dL — ABNORMAL LOW (ref 8.9–10.3)
Chloride: 97 mmol/L — ABNORMAL LOW (ref 98–111)
Creatinine, Ser: 4.8 mg/dL — ABNORMAL HIGH (ref 0.61–1.24)
GFR, Estimated: 13 mL/min — ABNORMAL LOW (ref >60)
Glucose, Bld: 84 mg/dL (ref 70–99)
Potassium: 3.9 mmol/L (ref 3.5–5.1)
Sodium: 136 mmol/L (ref 135–145)

## 2021-12-15 LAB — GRAM STAIN: Gram Stain: NONE SEEN

## 2021-12-15 MED ORDER — LIDOCAINE HCL (PF) 1 % IJ SOLN
INTRAMUSCULAR | Status: AC
  Filled 2021-12-15: qty 30

## 2021-12-15 MED ORDER — HEPARIN SODIUM (PORCINE) 1000 UNIT/ML IJ SOLN
INTRAMUSCULAR | Status: AC
  Filled 2021-12-15: qty 4

## 2021-12-15 MED ORDER — LABETALOL HCL 5 MG/ML IV SOLN
10.0000 mg | INTRAVENOUS | Status: DC | PRN
  Administered 2021-12-15: 10 mg via INTRAVENOUS
  Filled 2021-12-15: qty 4

## 2021-12-15 MED ORDER — ONDANSETRON HCL 4 MG/2ML IJ SOLN
4.0000 mg | Freq: Four times a day (QID) | INTRAMUSCULAR | Status: DC | PRN
  Administered 2021-12-15 – 2021-12-19 (×3): 4 mg via INTRAVENOUS
  Filled 2021-12-15 (×4): qty 2

## 2021-12-15 MED ORDER — OXYCODONE HCL 5 MG PO TABS
5.0000 mg | ORAL_TABLET | ORAL | Status: DC | PRN
  Administered 2021-12-15 (×2): 5 mg via ORAL
  Filled 2021-12-15 (×2): qty 1

## 2021-12-15 MED ORDER — APIXABAN 2.5 MG PO TABS
2.5000 mg | ORAL_TABLET | Freq: Two times a day (BID) | ORAL | Status: DC
  Administered 2021-12-15 – 2021-12-20 (×11): 2.5 mg via ORAL
  Filled 2021-12-15 (×11): qty 1

## 2021-12-15 MED ORDER — NITROGLYCERIN 2 % TD OINT
1.0000 [in_us] | TOPICAL_OINTMENT | Freq: Four times a day (QID) | TRANSDERMAL | Status: DC
Start: 2021-12-15 — End: 2021-12-15
  Administered 2021-12-15: 1 [in_us] via TOPICAL
  Filled 2021-12-15 (×2): qty 30

## 2021-12-15 MED ORDER — OXYCODONE HCL 5 MG PO TABS
5.0000 mg | ORAL_TABLET | Freq: Four times a day (QID) | ORAL | Status: DC | PRN
  Administered 2021-12-15 – 2021-12-20 (×16): 10 mg via ORAL
  Filled 2021-12-15 (×16): qty 2

## 2021-12-15 NOTE — Progress Notes (Signed)
2200 HD Nurse called this nurse for handoff report. Complete head to toe assessment given. POC for patient transport to pick up patient for ordered HD treatment.  ?2210 Patient transported to HD in bed with oxygen and remote telemetry. Patient agreeable with HD treatment at this time.  ?

## 2021-12-15 NOTE — Progress Notes (Signed)
?   12/15/21 0133  ?Vitals  ?BP (!) 213/94  ?MAP (mmHg) 127  ?BP Location Left Arm  ?Level of Consciousness  ?Level of Consciousness Alert  ?Oxygen Therapy  ?SpO2 95 %  ?O2 Device Non-rebreather Mask  ?O2 Flow Rate (L/min) 15 L/min  ?FiO2 (%) 100 %  ?Patient Activity (if Appropriate) In bed  ?Pulse Oximetry Type Continuous  ?Oximetry Probe Site Changed No  ?Pre-WUA / WUA Start  ?[place] Agitation Sedation Scale (RASS) 0  ?RASS Goal 0  ?ECG Monitoring  ?Cardiac Rhythm NSR  ?Pain Assessment  ?Pain Scale 0-10  ?Pain Score 9  ?Patients Stated Pain Goal 2  ?MEWS Score  ?MEWS Temp 0  ?MEWS Systolic 2  ?MEWS Pulse 0  ?MEWS RR 1  ?MEWS LOC 0  ?MEWS Score 3  ?MEWS Score Color Yellow  ? ?Patient returned to room on non-rebreather and nose bleed. Transported by hospital transport staff.  ?

## 2021-12-15 NOTE — Significant Event (Addendum)
Called by RN ?BP after dialysis is now 213/94, now requiring NRB. ? ?Nose bleed = dried blood.  So not having major blood loss at the moment. ? ?1) DC eliquis for now. ?2) labetalol 10-'20mg'$  Q2H PRN high BP ?3) NTG paste ?4) SCDs for DVT ppx ?

## 2021-12-15 NOTE — Procedures (Signed)
? ?  US guided Rt thoracentesis ? ?1120 cc amber fluid obtained ?Sent for labs per MD ? ?Tolerated well ?EBL: none ? ?Post CXR shows NO PTX per Dr Nevada Crane ?

## 2021-12-15 NOTE — Progress Notes (Signed)
?PROGRESS NOTE ? ? ? ?Thomas Robinson  JJK:[id number] DOB: March 03, 1961 DOA: 12/14/2021 ?PCP: [name], PA  ?Narrative: 60/M with history of ESRD on HD MWF, chronic diastolic CHF, chronic respiratory failure on 2 to 3 L O2, chronic pleural effusions, paroxysmal A-fib on Eliquis, anemia of chronic disease, hypertension, depression/anxiety presented to the ED again with dyspnea. ?-4 hospitalizations already this year with volume overload ?-Long history of signing off early at hemodialysis sessions ?-When EMS arrived O2 sats were 84% on 4 to 5 L O2, labs noted troponin of 128, creatinine of 8.7, BNP 2487, chest x-ray noted increased pulmonary edema and enlarging right pleural effusion ? ? ?Subjective: ?Breathing is starting to improve, was dialyzed last night ? ?Assessment & Plan: ? ?Acute on chronic hypoxic respiratory failure ?Recurrent volume overload, ESRD ?Acute on chronic diastolic CHF ?Recurrent right pleural effusion ?Severe PAH ?-Admitted yet again with volume overload, hypoxia, pulmonary edema and increasing right pleural effusion ?-Has had multiple thoracentesis, will obtain ultrasound-guided thoracentesis in radiology ?-Last echo with preserved EF, severe PAH, grade 1 DD ?-Long history of inability to tolerate full dialysis sessions, continues to sign out 1 to 2 hours early ?-Continue midodrine on HD days ?-Seen by palliative care 3 times this year, understands poor prognosis, wishes to continue aggressive care, full code ?-HD per nephrology tomorrow, wean O2 down as tolerated ? ?ESRD on dialysis on HD MWF-but noncompliant ?Nephrology following, underwent urgent HD last night ? ?Elevated troponin ?-Troponin persistently mildly elevated in setting of acute on chronic HFpEF and ESRD.  EKG without acute ischemic changes.  No significant coronary artery disease on prior cardiac work-up.  Doubt ACS at this time. ? ?Hyperkalemia ?Resolved ? ?Paroxysmal atrial fibrillation (HCC) ?In sinus rhythm on admission  with controlled rate.  Continue diltiazem and Eliquis. ? ?COPD (chronic obstructive pulmonary disease) ([place]) ?No wheezing on admission.  Continue Breo and albuterol as needed. ? ?Anemia of chronic disease ?-Hemoglobin close to baseline, continue EPO with HD ? ?Severe protein calorie malnutrition ? ?DVT prophylaxis: Eliquis ?Code Status: Full code ?Family Communication: Discussed with patient in detail, no family at bedside ?Disposition Plan: Home pending improvement in respiratory, volume status ? ?Consultants:  ?Nephrology ? ?Procedures:  ? ?Antimicrobials:  ? ? ?Objective: ?Vitals:  ? 12/15/21 0739 12/15/21 3716 12/15/21 0927 12/15/21 0945  ?BP: (!) 178/92  (!) 187/100 (!) 167/97  ?Pulse: 86     ?Resp:      ?Temp: 97.8 ?F (36.6 ?C)     ?TempSrc: Oral     ?SpO2: 96% 100%    ?Weight:      ?Height:      ? ? ?Intake/Output Summary (Last 24 hours) at 12/15/2021 1022 ?Last data filed at 12/15/2021 0200 ?Gross per 24 hour  ?Intake 60 ml  ?Output 1085 ml  ?Net -1025 ml  ? ?Filed Weights  ? 12/14/21 2226 12/15/21 0100 12/15/21 0502  ?Weight: 51.3 kg (P) 50 kg 48 kg  ? ? ?Examination: ? ?General exam: Chronically ill thinly built male sitting up in bed, AAO x3, no distress ?HEENT: Positive JVD ?CVS: S1-S2, regular rhythm ?Lungs: Diminished breath sounds in the right ?Abdomen: Soft, nontender, bowel sounds present ?Extremities: No edema  ?Psychiatry: Flat affect ? ? ? ?Data Reviewed:  ? ?CBC: ?Recent Labs  ?Lab 12/10/21 ?2242 12/14/21 ?1553 12/15/21 ?0422  ?WBC 5.2 4.3 4.4  ?NEUTROABS 3.9  --   --   ?HGB 9.2* 9.0* 8.4*  ?HCT 29.8* 29.1* 26.1*  ?MCV 100.7* 101.4* 98.1  ?PLT  153 156 136*  ? ?Basic Metabolic Panel: ?Recent Labs  ?Lab 12/10/21 ?2242 12/14/21 ?1553 12/15/21 ?0422  ?NA 140 138 136  ?K 5.9* 5.6* 3.9  ?CL 99 100 97*  ?CO2 23 21* 27  ?GLUCOSE 80 130* 84  ?BUN 77* 63* 25*  ?CREATININE 9.13* 8.72* 4.80*  ?CALCIUM 9.1 8.9 8.5*  ? ?GFR: ?Estimated Creatinine Clearance: 10.8 mL/min (A) (by C-G formula based on SCr of 4.8  mg/dL (H)). ?Liver Function Tests: ?Recent Labs  ?Lab 12/10/21 ?2242 12/14/21 ?1553  ?AST 12* 19  ?ALT 8 8  ?ALKPHOS 100 90  ?BILITOT 1.1 1.4*  ?PROT 6.7 6.8  ?ALBUMIN 3.1* 3.1*  ? ?Recent Labs  ?Lab 12/10/21 ?2242  ?LIPASE 45  ? ?No results for input(s): AMMONIA in the last 168 hours. ?Coagulation Profile: ?Recent Labs  ?Lab 12/10/21 ?2242  ?INR 1.2  ? ?Cardiac Enzymes: ?No results for input(s): CKTOTAL, CKMB, CKMBINDEX, TROPONINI in the last 168 hours. ?BNP (last 3 results) ?No results for input(s): PROBNP in the last 8760 hours. ?HbA1C: ?No results for input(s): HGBA1C in the last 72 hours. ?CBG: ?No results for input(s): GLUCAP in the last 168 hours. ?Lipid Profile: ?No results for input(s): CHOL, HDL, LDLCALC, TRIG, CHOLHDL, LDLDIRECT in the last 72 hours. ?Thyroid Function Tests: ?No results for input(s): TSH, T4TOTAL, FREET4, T3FREE, THYROIDAB in the last 72 hours. ?Anemia Panel: ?No results for input(s): VITAMINB12, FOLATE, FERRITIN, TIBC, IRON, RETICCTPCT in the last 72 hours. ?Urine analysis: ?   ?Component Value Date/Time  ? [place] YELLOW 05/24/2017 1729  ? APPEARANCEUR HAZY (A) 05/24/2017 1729  ? LABSPEC 1.011 05/24/2017 1729  ? PHURINE 5.0 05/24/2017 1729  ? GLUCOSEU 50 (A) 05/24/2017 1729  ? HGBUR SMALL (A) 05/24/2017 1729  ? [place] NEGATIVE 05/24/2017 1729  ? [place] NEGATIVE 05/24/2017 1729  ? PROTEINUR 100 (A) 05/24/2017 1729  ? UROBILINOGEN 0.2 04/09/2015 1527  ? NITRITE NEGATIVE 05/24/2017 1729  ? LEUKOCYTESUR TRACE (A) 05/24/2017 1729  ? ?Sepsis Labs: ?'@LABRCNTIP'$ (procalcitonin:4,lacticidven:4) ? ?) ?Recent Results (from the past 240 hour(s))  ?Resp Panel by RT-PCR (Flu A&B, Covid) Nasopharyngeal Swab     Status: None  ? Collection Time: 12/14/21  6:30 PM  ? Specimen: Nasopharyngeal Swab; Nasopharyngeal(NP) swabs in vial transport medium  ?Result Value Ref Range Status  ? SARS Coronavirus 2 by RT PCR NEGATIVE NEGATIVE Final  ?  Comment: (NOTE) ?SARS-CoV-2 target nucleic acids are NOT  DETECTED. ? ?The SARS-CoV-2 RNA is generally detectable in upper respiratory ?specimens during the acute phase of infection. The lowest ?concentration of SARS-CoV-2 viral copies this assay can detect is ?138 copies/mL. A negative result does not preclude SARS-Cov-2 ?infection and should not be used as the sole basis for treatment or ?other patient management decisions. A negative result may occur with  ?improper specimen collection/handling, submission of specimen other ?than nasopharyngeal swab, presence of viral mutation(s) within the ?areas targeted by this assay, and inadequate number of viral ?copies(<138 copies/mL). A negative result must be combined with ?clinical observations, patient history, and epidemiological ?information. The expected result is Negative. ? ?Fact Sheet for Patients:  ?EntrepreneurPulse.com.au ? ?Fact Sheet for Healthcare Providers:  ?IncredibleEmployment.be ? ?This test is no t yet approved or cleared by the [place] FDA and  ?has been authorized for detection and/or diagnosis of SARS-CoV-2 by ?FDA under an Emergency Use Authorization (EUA). This EUA will remain  ?in effect (meaning this test can be used) for the duration of the ?COVID-19 declaration under Section 564(b)(1) of the Act, 21 ?  U.S.C.section 360bbb-3(b)(1), unless the authorization is terminated  ?or revoked sooner.  ? ? ?  ? Influenza A by PCR NEGATIVE NEGATIVE Final  ? Influenza B by PCR NEGATIVE NEGATIVE Final  ?  Comment: (NOTE) ?The Xpert Xpress SARS-CoV-2/FLU/RSV plus assay is intended as an aid ?in the diagnosis of influenza from Nasopharyngeal swab specimens and ?should not be used as a sole basis for treatment. Nasal washings and ?aspirates are unacceptable for Xpert Xpress SARS-CoV-2/FLU/RSV ?testing. ? ?Fact Sheet for Patients: ?EntrepreneurPulse.com.au ? ?Fact Sheet for Healthcare Providers: ?IncredibleEmployment.be ? ?This test is not yet  approved or cleared by the [place] FDA and ?has been authorized for detection and/or diagnosis of SARS-CoV-2 by ?FDA under an Emergency Use Authorization (EUA). This EUA will remain ?in effect (meani

## 2021-12-15 NOTE — Progress Notes (Signed)
?   12/15/21 0239  ?Urine Characteristics  ?Bladder Scan Volume (mL) 7 mL ?(Patient requested to be bladder scanned.)  ? ? ?

## 2021-12-15 NOTE — Progress Notes (Addendum)
Patient arrived to inpt room.  ? ? 12/14/21 2114  ?Vitals  ?Temp 97.7 ?F (36.5 ?C)  ?Temp Source Oral  ?BP (!) 223/95  ?MAP (mmHg) 130  ?BP Location Left Arm  ?BP Method Automatic  ?Patient Position (if appropriate) Lying  ?Pulse Rate 76  ?Pulse Rate Source Monitor  ?ECG Heart Rate 78  ?Resp (!) 21  ?Level of Consciousness  ?Level of Consciousness Alert  ?Oxygen Therapy  ?SpO2 92 %  ?O2 Flow Rate (L/min) 6 L/min  ?Patient Activity (if Appropriate) In bed  ?Pulse Oximetry Type Continuous  ?Oximetry Probe Site Changed No  ?Pre-WUA / WUA Start  ?[place] Agitation Sedation Scale (RASS) 0  ?RASS Goal 0  ?ECG Monitoring  ?PR interval 0.26  ?QRS interval 0.11  ?QT interval 0.34  ?QTc interval 0.41  ?CV Strip Heart Rate 79  ?Cardiac Rhythm NSR;Heart block;Other (Comment) ?(Admit)  ?Heart Block Type 1st degree AVB  ?Telemetry Box Number 33  ?Tele Box Verification Completed by Second Verifier Completed  ?Pain Assessment  ?Pain Scale 0-10  ?Pain Score 0  ?Glasgow Coma Scale  ?Eye Opening 4  ?Best Verbal Response (NON-intubated) 5  ?Best Motor Response 6  ?[place] Coma Scale Score 15  ?Height and Weight  ?Weight 50 kg  ?Type of Scale Used Bed  ?BMI (Calculated) 16.76  ? ? ?

## 2021-12-16 DIAGNOSIS — I5033 Acute on chronic diastolic (congestive) heart failure: Secondary | ICD-10-CM | POA: Diagnosis not present

## 2021-12-16 LAB — RENAL FUNCTION PANEL
Albumin: 2.5 g/dL — ABNORMAL LOW (ref 3.5–5.0)
Anion gap: 11 (ref 5–15)
BUN: 34 mg/dL — ABNORMAL HIGH (ref 8–23)
CO2: 28 mmol/L (ref 22–32)
Calcium: 7.9 mg/dL — ABNORMAL LOW (ref 8.9–10.3)
Chloride: 98 mmol/L (ref 98–111)
Creatinine, Ser: 6.55 mg/dL — ABNORMAL HIGH (ref 0.61–1.24)
GFR, Estimated: 9 mL/min — ABNORMAL LOW (ref >60)
Glucose, Bld: 100 mg/dL — ABNORMAL HIGH (ref 70–99)
Phosphorus: 6.7 mg/dL — ABNORMAL HIGH (ref 2.5–4.6)
Potassium: 5.2 mmol/L — ABNORMAL HIGH (ref 3.5–5.1)
Sodium: 137 mmol/L (ref 135–145)

## 2021-12-16 LAB — CBC
HCT: 26.8 % — ABNORMAL LOW (ref 39.0–52.0)
Hemoglobin: 8 g/dL — ABNORMAL LOW (ref 13.0–17.0)
MCH: 30.5 pg (ref 26.0–34.0)
MCHC: 29.9 g/dL — ABNORMAL LOW (ref 30.0–36.0)
MCV: 102.3 fL — ABNORMAL HIGH (ref 80.0–100.0)
Platelets: 131 10*3/uL — ABNORMAL LOW (ref 150–400)
RBC: 2.62 MIL/uL — ABNORMAL LOW (ref 4.22–5.81)
RDW: 15.5 % (ref 11.5–15.5)
WBC: 4.6 10*3/uL (ref 4.0–10.5)
nRBC: 0 % (ref 0.0–0.2)

## 2021-12-16 LAB — HEPATITIS B SURFACE ANTIGEN: Hepatitis B Surface Ag: NONREACTIVE

## 2021-12-16 LAB — HEPATITIS B SURFACE ANTIBODY,QUALITATIVE: Hep B S Ab: NONREACTIVE

## 2021-12-16 LAB — PHOSPHORUS: Phosphorus: 4.9 mg/dL — ABNORMAL HIGH (ref 2.5–4.6)

## 2021-12-16 LAB — PATHOLOGIST SMEAR REVIEW: Path Review: NEGATIVE

## 2021-12-16 LAB — LACTATE DEHYDROGENASE: LDH: 179 U/L (ref 98–192)

## 2021-12-16 LAB — PROTEIN, TOTAL: Total Protein: 6.2 g/dL — ABNORMAL LOW (ref 6.5–8.1)

## 2021-12-16 MED ORDER — DM-GUAIFENESIN ER 30-600 MG PO TB12
1.0000 | ORAL_TABLET | Freq: Two times a day (BID) | ORAL | Status: DC | PRN
  Administered 2021-12-16 (×2): 1 via ORAL
  Filled 2021-12-16 (×2): qty 1

## 2021-12-16 MED ORDER — HEPARIN SODIUM (PORCINE) 1000 UNIT/ML IJ SOLN
INTRAMUSCULAR | Status: AC
  Administered 2021-12-16: 1000 [IU]
  Filled 2021-12-16: qty 4

## 2021-12-16 NOTE — Progress Notes (Signed)
Heart Failure Navigator Progress Note ? ?Assessed for Heart & Vascular TOC clinic readiness.  ?Patient does not meet criteria due to ESRD..  ? ? ? ?Earnestine Leys, BSN, RN ?Heart Failure Nurse Navigator ?Secure Chat Only   ?

## 2021-12-16 NOTE — Progress Notes (Signed)
[facility] ?Progress Note ? ?Subjective: seen in room, only 1.2 L off w/ HD last night, pt s/o early ? ?Vitals:  ? 12/15/21 2032 12/15/21 2346 12/16/21 0235 12/16/21 0449  ?BP: (!) 165/84 (!) 151/76  (!) 145/71  ?Pulse: 73 73  70  ?Resp: '19 19  19  '$ ?Temp: 98.3 ?F (36.8 ?C) 98.8 ?F (37.1 ?C)  98.8 ?F (37.1 ?C)  ?TempSrc: Oral Oral  Oral  ?SpO2: 98% 95%  96%  ?Weight:  47.8 kg 47.8 kg   ?Height:      ? ? ?Exam: ?Gen alert, no distress, frail adult WM, nasal O2 ?No jvd or bruits ?Chest clear L,  R base dec'd BS ?RRR no RG ?Abd soft ntnd no mass or ascites +bs ?Ext no LE edema ?Neuro is alert, Ox 3 , nf ?   RIJ TDC/ maturing RUA AVF ?  ?  ? Home meds include - eliquis 2.5 bid, sensipar 60 hs, cardizem cd 300 qd, ensure, breo ellipta, renavit, sl ntg, oxy IR, zoloft, renvela 1 ac tid, prns/ vits/ supps ?  ? CXR 4/16 - IMPRESSION: Findings consistent with worsening pulmonary edema and enlarging right pleural effusion. ?  ? OP HD: SW MWF ? 3.5h  400/800  2/2.5 bath P2  RIJ TDC/ maturing RUA AVF   Hep none ? - get records ?  ?  ?Assessment/ Plan: ?Vol overload /SOB/ AHRF - worsening pulm edema and R effusion. Had HD here overnight, s/o early. Only 1.2 L off. Going for thoracentesis today. ?ESRD - on HD MWF. Had HD here overnight last night. Next HD Monday.  ?Atrial fib - on eliquis and cardizem ?MBD ckd - cont sensipar and renvela as binder. CCa in range, added on phos.  ?Anemia ckd - Hb 9s here, get records.  ?Recurrent R pleural effusion - per pmd ?HFpEF - vol overload as above ?  ? ? ? ?[name], hospital ?12/15/2021, 9:08 AM ? ? ?Recent Labs  ?Lab 12/10/21 ?2242 12/14/21 ?1553 12/15/21 ?0422  ?HGB 9.2* 9.0* 8.4*  ?ALBUMIN 3.1* 3.1*  --   ?CALCIUM 9.1 8.9 8.5*  ?CREATININE 9.13* 8.72* 4.80*  ?K 5.9* 5.6* 3.9  ? ?Inpatient medications: ? apixaban  2.5 mg Oral BID  ? Chlorhexidine Gluconate Cloth  6 each Topical Q0600  ? cinacalcet  60 mg Oral QPM  ? diltiazem  300 mg Oral q morning  ? feeding supplement  237 mL  Oral BID BM  ? fluticasone furoate-vilanterol  1 puff Inhalation Daily  ? sertraline  25 mg Oral Daily  ? sevelamer carbonate  800 mg Oral TID with meals  ? sodium chloride flush  3 mL Intravenous Q12H  ? ? ?acetaminophen **OR** acetaminophen, albuterol, labetalol, ondansetron (ZOFRAN) IV, oxyCODONE, senna-docusate ? ? ? ? ? ? ?

## 2021-12-16 NOTE — TOC Initial Note (Addendum)
Transition of Care (TOC) - Initial/Assessment Note  ? ? ?Patient Details  ?Name: Thomas Robinson ?MRN: [id number] ?Date of Birth: [birth year] ? ?Transition of Care (TOC) CM/SW Contact:    ?Zenon Mayo, RN ?Phone Number: ?12/16/2021, 5:06 PM ? ?Clinical Narrative:                 ?From home, he is active with [facility] for [facility], NCM offered choice ,he would like to continue with [facility].  He presents with fluid overload,  he has HD on MWF which he likes to end early.  He has home oxygen with  [employer] 2 liters, he is currently on 8 liters here. He had  thoracentesis yesterday. He states a family member will transport him home at dc. Lefty Amy a message with [facility], waiting to hear back from her.  ? ?4/18- Amy states she will call this NCM back shortly. ? ?Expected Discharge Plan: [place] ?Barriers to Discharge: Continued Medical Work up ? ? ?Patient Goals and CMS Choice ?Patient states their goals for this hospitalization and ongoing recovery are:: return home ?CMS Medicare.gov Compare Post Acute Care list provided to:: Patient ?Choice offered to / list presented to : Patient ? ?Expected Discharge Plan and Services ?Expected Discharge Plan: [place] ?In-house Referral: NA ?Discharge Planning Services: CM Consult ?Post Acute Care Choice: Resumption of Svcs/PTA Provider ?Living arrangements for the past 2 months: Apartment ?                ?  ?DME Agency: NA ?  ?  ?  ?HH Arranged: RN ?[place] Agency: [place] ?Date HH Agency Contacted: 12/16/21 ?Time [place]: 3762 ?Representative spoke with at [place]: Blanca ? ?Prior Living Arrangements/Services ?Living arrangements for the past 2 months: Apartment ?Lives with:: Significant Other ?Patient language and need for interpreter reviewed:: Yes ?Do you feel safe going back to the place where you live?: Yes      ?Need for Family Participation in Patient Care: Yes (Comment) ?Care giver support system in place?: Yes  (comment) ?  ?Criminal Activity/Legal Involvement Pertinent to Current Situation/Hospitalization: No - Comment as needed ? ?Activities of Daily Living ?  ?  ? ?Permission Sought/Granted ?  ?  ?   ?   ?   ?   ? ?Emotional Assessment ?Appearance:: Appears stated age ?Attitude/Demeanor/Rapport: Engaged ?Affect (typically observed): Appropriate ?Orientation: : Oriented to Self, Oriented to Place, Oriented to  Time, Oriented to Situation ?Alcohol / Substance Use: Not Applicable ?Psych Involvement: No (comment) ? ?Admission diagnosis:  Hyperkalemia [E87.5] ?Acute pulmonary edema (HCC) [J81.0] ?Hemodialysis patient [facility]) [Z99.2] ?Pleural effusion on right [J90] ?Acute on chronic respiratory failure with hypoxia (HCC) [J96.21] ?Acute on chronic heart failure with preserved ejection fraction (HFpEF) ([place]) [I50.33] ?Patient Active Problem List  ? Diagnosis Date Noted  ? Hyperkalemia 12/14/2021  ? COPD (chronic obstructive pulmonary disease) ([place])   ? Protein-calorie malnutrition, severe ([place]) 11/20/2021  ? Depression with anxiety 11/15/2021  ? Pleural effusion, bilateral 10/15/2021  ? Left rib fracture 10/14/2021  ? Hypoxemia 10/14/2021  ? Pleural effusion, right 10/14/2021  ? Paroxysmal atrial fibrillation ([place]) 09/29/2021  ? Acute on chronic respiratory failure with hypoxemia due to volume overload due to noncompliance with hemodialysis  09/19/2021  ? Persistent atrial fibrillation ([place]) 09/19/2021  ? Pulmonary emboli ([place]) 09/19/2021  ? Cachexia ([place]) 09/19/2021  ? Chronic respiratory failure with hypoxia ([place]) 09/07/2021  ? Fever 09/07/2021  ? Hypotension 09/07/2021  ?  Acute on chronic diastolic (congestive) heart failure ([place]) 08/26/2021  ? Fluid overload 08/25/2021  ? Atrial fibrillation with RVR ([place]) 08/25/2021  ? Sacral pressure sore 08/12/2021  ? Shortness of breath 07/16/2021  ? Balanitis 07/16/2021  ? Vomiting   ? Gastritis and gastroduodenitis   ? Grade III hemorrhoids   ? Decubitus ulcer of coccyx, stage 2 ([place])  07/08/2021  ? Prolonged QT interval 07/08/2021  ? Hematochezia   ? Atrial flutter ([place])   ? Pressure injury of skin 07/02/2021  ? Abnormal chest CT 06/30/2021  ? Recurrent pleural effusion on right 06/30/2021  ? SIRS (systemic inflammatory response syndrome) ([place]) 06/30/2021  ? Acute hypoxemic respiratory failure ([place]) 05/18/2021  ? Bronchitis 05/18/2021  ? Preoperative cardiovascular examination 03/06/2021  ? CHF (congestive heart failure) ([place]) 02/17/2021  ? Cellulitis of arm, left 02/01/2021  ? Cellulitis of left lower limb 02/01/2021  ? Cellulitis, unspecified 02/01/2021  ? Left arm swelling 01/25/2021  ? Acute and chronic respiratory failure with hypoxia ([place]) 01/13/2021  ? Concussion with no loss of consciousness 01/12/2021  ? Laceration without foreign body of left forearm, initial encounter 01/12/2021  ? Age-related physical debility 09/20/2020  ? Allergy, unspecified, initial encounter 09/20/2020  ? Nausea 09/20/2020  ? Pain, unspecified 09/20/2020  ? Pruritus, unspecified 09/20/2020  ? S/P cardiac cath 08/27/2020  ? Acute encephalopathy 08/26/2020  ? Peripheral neuropathy 08/26/2020  ? Elevated troponin 08/25/2020  ? Arthritis   ? Asthma   ? Cataract   ? Cerebral infarction due to thrombosis of cerebral artery ([place])   ? History of CVA (cerebrovascular accident)   ? Chronic kidney disease   ? Dyspnea   ? Hyperlipidemia   ? Hypertension   ? Kidney failure   ? Restless leg syndrome   ? Degenerative lumbar spinal stenosis 06/26/2020  ? Diastolic dysfunction 46/50/3546  ? Renovascular hypertension 06/22/2020  ? Demand ischemia ([place]) 06/18/2020  ? LVH (left ventricular hypertrophy) due to hypertensive disease, with heart failure ([place]) 06/18/2020  ? Myofascial pain syndrome 06/12/2020  ? Encounter for removal of sutures 03/29/2020  ? History of fusion of cervical spine 03/26/2020  ? Non-compliance with renal dialysis 02/08/2020  ? Pulmonary edema 01/23/2020  ? Cervical disc herniation 01/04/2020  ? Tachycardia  01/04/2020  ? SOB (shortness of breath)   ? Hypoxia 12/30/2019  ? Chronic low back pain 11/24/2019  ? Degeneration of lumbar intervertebral disc 11/24/2019  ? Lumbar radiculopathy 11/24/2019  ? Left shoulder pain 11/11/2019  ? Anaphylactic shock, unspecified, sequela 06/10/2019  ? Rib fractures 06/05/2019  ? Fall at home, initial encounter 06/04/2019  ? Right rib fracture 06/04/2019  ? Lung contusion 06/04/2019  ? Acute respiratory failure with hypoxia ([place]) 06/04/2019  ? GERD without esophagitis 06/04/2019  ? Chronic diastolic (congestive) heart failure ([place]) 06/04/2019  ? Fall 06/04/2019  ? Thoracic ascending aortic aneurysm 06/04/2019  ? GERD (gastroesophageal reflux disease) 06/04/2019  ? Acute on chronic heart failure with preserved ejection fraction (HFpEF) ([place]) 05/16/2019  ? Carpal tunnel syndrome of right wrist 10/05/2018  ? Ulnar neuropathy 10/05/2018  ? Neuritis of right ulnar nerve 09/13/2018  ? Pain in joint of right elbow 09/13/2018  ? Chest pain 04/13/2018  ? Chronic diastolic heart failure ([place]) 04/13/2018  ? Acute on chronic diastolic CHF (congestive heart failure) ([place]) 04/13/2018  ? Atypical chest pain 02/16/2018  ? ESRD on dialysis on HD MWF-but noncompliant 02/16/2018  ? End stage renal disease ([place]) 02/16/2018  ? CAP (community acquired pneumonia) 10/23/2017  ?  Asthma, chronic, unspecified asthma severity, with acute exacerbation 10/23/2017  ? Respiratory failure, acute ([place]) 10/23/2017  ? Pulmonary hypertension ([place]) 10/23/2017  ? Iron deficiency anemia, unspecified 09/09/2017  ? Gram-negative sepsis, unspecified ([place]) 09/05/2017  ? Encounter for immunization 07/08/2017  ? Moderate protein-calorie malnutrition ([place]) 06/06/2017  ? Recurrent right pleural effusion 06/06/2017  ? Coagulation defect, unspecified ([place]) 06/04/2017  ? Hypokalemia 06/04/2017  ? Other hyperlipidemia 06/04/2017  ? Secondary hyperparathyroidism of renal origin ([place]) 06/04/2017  ? Macrocytic anemia 05/24/2017  ? Uremia  05/24/2017  ? Metabolic acidosis 11/64/3539  ? Hypertensive urgency 05/24/2017  ? Acute kidney injury superimposed on CKD ([place]) 05/24/2017  ? CKD (chronic kidney disease), stage V ([place]) 05/24/2017  ? GI ble

## 2021-12-16 NOTE — Progress Notes (Signed)
[facility] ?Progress Note ? ?Subjective: seen in room. No new c/o's. Breathing better. Had thoracentesis R side yesterday.  ? ?Vitals:  ? 12/15/21 2032 12/15/21 2346 12/16/21 0235 12/16/21 0449  ?BP: (!) 165/84 (!) 151/76  (!) 145/71  ?Pulse: 73 73  70  ?Resp: '19 19  19  '$ ?Temp: 98.3 ?F (36.8 ?C) 98.8 ?F (37.1 ?C)  98.8 ?F (37.1 ?C)  ?TempSrc: Oral Oral  Oral  ?SpO2: 98% 95%  96%  ?Weight:  47.8 kg 47.8 kg   ?Height:      ? ? ?Exam: ?Gen alert, no distress, frail adult WM, nasal O2 ?No jvd or bruits ?Chest clear L,  R base dec'd BS ?RRR no RG ?Abd soft ntnd no mass or ascites +bs ?Ext no LE edema ?Neuro is alert, Ox 3 , nf ?   RIJ TDC/ maturing RUA AVF ?  ?  ? Home meds include - eliquis 2.5 bid, sensipar 60 hs, cardizem cd 300 qd, ensure, breo ellipta, renavit, sl ntg, oxy IR, zoloft, renvela 1 ac tid, prns/ vits/ supps ?  ? CXR 4/16 - IMPRESSION: Findings consistent with worsening pulmonary edema and enlarging right pleural effusion. ?  ? OP HD: SW MWF ? 3.5h  45.5kg  400/800  2/2 bath P2  RIJ TDC/ maturing RUA AVF   Hep none ? - ave time on machine 1.5 hrs x last 3 ? - mircera 225 q2, last 4/14, due 4/28 ? - no vdra ?  ?  ?Assessment/ Plan: ?Vol overload /SOB/ AHRF - worsening pulm edema and R effusion. Only got 1.2 L off w/ HD here Sat, no BP drops, signed off early. Attempt further vol reduction w/ HD today.  ?ESRD - on HD MWF. Had HD here Sat night. Next HD today to get back on schedule.   ?Atrial fib - on eliquis and cardizem ?MBD ckd - cont sensipar and renvela as binder. CCa in range, added on phos. Not on vdra.  ?Anemia ckd - Hb 9s here. Just got esa, no Fe ordered.   ?Recurrent R pleural effusion - per pmd ?HFpEF - w/ SOB and vol excess, as above ?  ? ? ? ?[name], hospital ?12/15/2021, 9:08 AM ? ? ?Recent Labs  ?Lab 12/10/21 ?2242 12/14/21 ?1553 12/15/21 ?0422  ?HGB 9.2* 9.0* 8.4*  ?ALBUMIN 3.1* 3.1*  --   ?CALCIUM 9.1 8.9 8.5*  ?CREATININE 9.13* 8.72* 4.80*  ?K 5.9* 5.6* 3.9  ? ? ?Inpatient  medications: ? apixaban  2.5 mg Oral BID  ? Chlorhexidine Gluconate Cloth  6 each Topical Q0600  ? cinacalcet  60 mg Oral QPM  ? diltiazem  300 mg Oral q morning  ? feeding supplement  237 mL Oral BID BM  ? fluticasone furoate-vilanterol  1 puff Inhalation Daily  ? sertraline  25 mg Oral Daily  ? sevelamer carbonate  800 mg Oral TID with meals  ? sodium chloride flush  3 mL Intravenous Q12H  ? ? ?acetaminophen **OR** acetaminophen, albuterol, labetalol, ondansetron (ZOFRAN) IV, oxyCODONE, senna-docusate ? ? ? ? ? ? ?

## 2021-12-16 NOTE — Plan of Care (Signed)
  Problem: Education: Goal: Knowledge of General Education information will improve Description: Including pain rating scale, medication(s)/side effects and non-pharmacologic comfort measures Outcome: Progressing   Problem: Nutrition: Goal: Adequate nutrition will be maintained Outcome: Progressing   Problem: Activity: Goal: Risk for activity intolerance will decrease Outcome: Progressing   

## 2021-12-16 NOTE — Progress Notes (Signed)
?PROGRESS NOTE ? ? ? ?Thomas Robinson  KKX:[id number] DOB: Jul 14, 1961 DOA: 12/14/2021 ?PCP: [name], PA  ?Narrative: 60/M with history of ESRD on HD MWF, chronic diastolic CHF, chronic respiratory failure on 2 to 3 L O2, chronic pleural effusions, paroxysmal A-fib on Eliquis, anemia of chronic disease, hypertension, depression/anxiety presented to the ED again with dyspnea. ?-4 hospitalizations already this year with volume overload ?-Long history of signing off early at hemodialysis sessions ?-When EMS arrived O2 sats were 84% on 4 to 5 L O2, labs noted troponin of 128, creatinine of 8.7, BNP 2487, chest x-ray noted increased pulmonary edema and enlarging right pleural effusion ? ? ?Subjective: ?Breathing is starting to improve, was dialyzed last night ? ?Assessment & Plan: ? ?Acute on chronic hypoxic respiratory failure ?Recurrent volume overload, ESRD ?Acute on chronic diastolic CHF ?Recurrent right pleural effusion ?Severe PAH ?-Admitted yet again with volume overload, hypoxia, pulmonary edema and increasing right pleural effusion ?-Has had multiple thoracentesis, and went thoracentesis in IR yesterday, 1.1 L drained ?-Last echo with preserved EF, severe PAH, grade 1 DD ?-Long history of inability to tolerate full dialysis sessions, continues to sign out 1 to 2 hours early ?-Continue midodrine on HD days ?-Seen by palliative care 3 times this year, understands poor prognosis, wishes to continue aggressive care, full code ?-HD today, wean down O2 as tolerated ? ?ESRD on dialysis on HD MWF-but noncompliant ?Nephrology following, underwent urgent HD last night ? ?Elevated troponin ?-Troponin persistently mildly elevated in setting of acute on chronic HFpEF and ESRD.  EKG without acute ischemic changes.  No significant coronary artery disease on prior cardiac work-up.  Doubt ACS at this time. ? ?Hyperkalemia ?Resolved ? ?Paroxysmal atrial fibrillation (HCC) ?In sinus rhythm on admission with controlled rate.   Continue diltiazem and Eliquis. ? ?COPD (chronic obstructive pulmonary disease) ([place]) ?No wheezing on admission.  Continue Breo and albuterol as needed. ? ?Anemia of chronic disease ?-Hemoglobin close to baseline, continue EPO with HD ? ?Severe protein calorie malnutrition ? ?DVT prophylaxis: Eliquis ?Code Status: Full code ?Family Communication: Discussed with patient in detail, no family at bedside ?Disposition Plan: Home pending improvement in respiratory, volume status, likely 1 to 2 days ? ?Consultants:  ?Nephrology ? ?Procedures:  ? ?Antimicrobials:  ? ? ?Objective: ?Vitals:  ? 12/16/21 1130 12/16/21 1200 12/16/21 1230 12/16/21 1300  ?BP: (!) 162/78 (!) 151/76 (!) 154/72 (!) 145/68  ?Pulse: 77 80 79 80  ?Resp: '15 18  17  '$ ?Temp:      ?TempSrc:      ?SpO2:      ?Weight:      ?Height:      ? ? ?Intake/Output Summary (Last 24 hours) at 12/16/2021 1319 ?Last data filed at 12/16/2021 1696 ?Gross per 24 hour  ?Intake 600 ml  ?Output 100 ml  ?Net 500 ml  ? ?Filed Weights  ? 12/15/21 2346 12/16/21 0235 12/16/21 1112  ?Weight: 47.8 kg 47.8 kg 46.3 kg  ? ? ?Examination: ? ?General exam: Chronically ill thinly built male, sitting up in bed, AAOx3, no distress ?HEENT: Positive JVD ?CVS: S1-S2, regular rhythm ?Lungs: Improved air movement bilaterally, few basilar rales ?Abdomen: Soft, nontender, bowel sounds present  ?Extremities: No edema  ?Psychiatry: Flat affect ? ? ? ?Data Reviewed:  ? ?CBC: ?Recent Labs  ?Lab 12/10/21 ?2242 12/14/21 ?1553 12/15/21 ?0422 12/16/21 ?1137  ?WBC 5.2 4.3 4.4 4.6  ?NEUTROABS 3.9  --   --   --   ?HGB 9.2* 9.0* 8.4* 8.0*  ?  HCT 29.8* 29.1* 26.1* 26.8*  ?MCV 100.7* 101.4* 98.1 102.3*  ?PLT 153 156 136* 131*  ? ?Basic Metabolic Panel: ?Recent Labs  ?Lab 12/10/21 ?2242 12/14/21 ?1553 12/15/21 ?0422 12/15/21 ?0730 12/16/21 ?1136  ?NA 140 138 136  --  137  ?K 5.9* 5.6* 3.9  --  5.2*  ?CL 99 100 97*  --  98  ?CO2 23 21* 27  --  28  ?GLUCOSE 80 130* 84  --  100*  ?BUN 77* 63* 25*  --  34*  ?CREATININE  9.13* 8.72* 4.80*  --  6.55*  ?CALCIUM 9.1 8.9 8.5*  --  7.9*  ?PHOS  --   --   --  4.9* 6.7*  ? ?GFR: ?Estimated Creatinine Clearance: 7.7 mL/min (A) (by C-G formula based on SCr of 6.55 mg/dL (H)). ?Liver Function Tests: ?Recent Labs  ?Lab 12/10/21 ?2242 12/14/21 ?1553 12/16/21 ?0827 12/16/21 ?1136  ?AST 12* 19  --   --   ?ALT 8 8  --   --   ?ALKPHOS 100 90  --   --   ?BILITOT 1.1 1.4*  --   --   ?PROT 6.7 6.8 6.2*  --   ?ALBUMIN 3.1* 3.1*  --  2.5*  ? ?Recent Labs  ?Lab 12/10/21 ?2242  ?LIPASE 45  ? ?No results for input(s): AMMONIA in the last 168 hours. ?Coagulation Profile: ?Recent Labs  ?Lab 12/10/21 ?2242  ?INR 1.2  ? ?Cardiac Enzymes: ?No results for input(s): CKTOTAL, CKMB, CKMBINDEX, TROPONINI in the last 168 hours. ?BNP (last 3 results) ?No results for input(s): PROBNP in the last 8760 hours. ?HbA1C: ?No results for input(s): HGBA1C in the last 72 hours. ?CBG: ?No results for input(s): GLUCAP in the last 168 hours. ?Lipid Profile: ?No results for input(s): CHOL, HDL, LDLCALC, TRIG, CHOLHDL, LDLDIRECT in the last 72 hours. ?Thyroid Function Tests: ?No results for input(s): TSH, T4TOTAL, FREET4, T3FREE, THYROIDAB in the last 72 hours. ?Anemia Panel: ?No results for input(s): VITAMINB12, FOLATE, FERRITIN, TIBC, IRON, RETICCTPCT in the last 72 hours. ?Urine analysis: ?   ?Component Value Date/Time  ? [place] YELLOW 05/24/2017 1729  ? APPEARANCEUR HAZY (A) 05/24/2017 1729  ? LABSPEC 1.011 05/24/2017 1729  ? PHURINE 5.0 05/24/2017 1729  ? GLUCOSEU 50 (A) 05/24/2017 1729  ? HGBUR SMALL (A) 05/24/2017 1729  ? [place] NEGATIVE 05/24/2017 1729  ? [place] NEGATIVE 05/24/2017 1729  ? PROTEINUR 100 (A) 05/24/2017 1729  ? UROBILINOGEN 0.2 04/09/2015 1527  ? NITRITE NEGATIVE 05/24/2017 1729  ? LEUKOCYTESUR TRACE (A) 05/24/2017 1729  ? ?Sepsis Labs: ?'@LABRCNTIP'$ (procalcitonin:4,lacticidven:4) ? ?) ?Recent Results (from the past 240 hour(s))  ?Resp Panel by RT-PCR (Flu A&B, Covid) Nasopharyngeal Swab     Status:  None  ? Collection Time: 12/14/21  6:30 PM  ? Specimen: Nasopharyngeal Swab; Nasopharyngeal(NP) swabs in vial transport medium  ?Result Value Ref Range Status  ? SARS Coronavirus 2 by RT PCR NEGATIVE NEGATIVE Final  ?  Comment: (NOTE) ?SARS-CoV-2 target nucleic acids are NOT DETECTED. ? ?The SARS-CoV-2 RNA is generally detectable in upper respiratory ?specimens during the acute phase of infection. The lowest ?concentration of SARS-CoV-2 viral copies this assay can detect is ?138 copies/mL. A negative result does not preclude SARS-Cov-2 ?infection and should not be used as the sole basis for treatment or ?other patient management decisions. A negative result may occur with  ?improper specimen collection/handling, submission of specimen other ?than nasopharyngeal swab, presence of viral mutation(s) within the ?areas targeted by this assay, and inadequate number  of viral ?copies(<138 copies/mL). A negative result must be combined with ?clinical observations, patient history, and epidemiological ?information. The expected result is Negative. ? ?Fact Sheet for Patients:  ?EntrepreneurPulse.com.au ? ?Fact Sheet for Healthcare Providers:  ?IncredibleEmployment.be ? ?This test is no t yet approved or cleared by the [place] FDA and  ?has been authorized for detection and/or diagnosis of SARS-CoV-2 by ?FDA under an Emergency Use Authorization (EUA). This EUA will remain  ?in effect (meaning this test can be used) for the duration of the ?COVID-19 declaration under Section 564(b)(1) of the Act, 21 ?U.S.C.section 360bbb-3(b)(1), unless the authorization is terminated  ?or revoked sooner.  ? ? ?  ? Influenza A by PCR NEGATIVE NEGATIVE Final  ? Influenza B by PCR NEGATIVE NEGATIVE Final  ?  Comment: (NOTE) ?The Xpert Xpress SARS-CoV-2/FLU/RSV plus assay is intended as an aid ?in the diagnosis of influenza from Nasopharyngeal swab specimens and ?should not be used as a sole basis for  treatment. Nasal washings and ?aspirates are unacceptable for Xpert Xpress SARS-CoV-2/FLU/RSV ?testing. ? ?Fact Sheet for Patients: ?EntrepreneurPulse.com.au ? ?Fact Sheet for Healthcare Pro

## 2021-12-17 DIAGNOSIS — I5033 Acute on chronic diastolic (congestive) heart failure: Secondary | ICD-10-CM | POA: Diagnosis not present

## 2021-12-17 LAB — LACTATE DEHYDROGENASE, PLEURAL OR PERITONEAL FLUID: LD, Fluid: 88 U/L — ABNORMAL HIGH (ref 3–23)

## 2021-12-17 LAB — MISC LABCORP TEST (SEND OUT)

## 2021-12-17 LAB — BODY FLUID CELL COUNT WITH DIFFERENTIAL
Eos, Fluid: 1 %
Lymphs, Fluid: 11 %
Monocyte-Macrophage-Serous Fluid: 73 % (ref 50–90)
Neutrophil Count, Fluid: 15 % (ref 0–25)
Total Nucleated Cell Count, Fluid: 222 cu mm (ref 0–1000)

## 2021-12-17 LAB — PROTEIN, PLEURAL OR PERITONEAL FLUID: Total protein, fluid: <3 g/dL

## 2021-12-17 LAB — HEPATITIS B SURFACE ANTIBODY, QUANTITATIVE: Hep B S AB Quant (Post): <3.1 m[IU]/mL — ABNORMAL LOW (ref >9.9)

## 2021-12-17 MED ORDER — NEPRO/CARBSTEADY PO LIQD: 237.0000 mL | Freq: Two times a day (BID) | ORAL | Status: DC

## 2021-12-17 MED ORDER — RENA-VITE PO TABS
1.0000 | ORAL_TABLET | Freq: Every day | ORAL | Status: DC
  Administered 2021-12-17 – 2021-12-19 (×3): 1 via ORAL
  Filled 2021-12-17 (×3): qty 1

## 2021-12-17 NOTE — Progress Notes (Signed)
?PROGRESS NOTE ? ? ? ?Thomas Robinson  YNW:[id number] DOB: Dec 03, 1960 DOA: 12/14/2021 ?PCP: [name], PA  ?Narrative: 60/M with history of ESRD on HD MWF, chronic diastolic CHF, chronic respiratory failure on 2 to 3 L O2, chronic pleural effusions, paroxysmal A-fib on Eliquis, anemia of chronic disease, hypertension, depression/anxiety presented to the ED again with dyspnea. ?-4 hospitalizations already this year with volume overload ?-Long history of signing off early at hemodialysis sessions ?-When EMS arrived O2 sats were 84% on 4 to 5 L O2, labs noted troponin of 128, creatinine of 8.7, BNP 2487, chest x-ray noted increased pulmonary edema and enlarging right pleural effusion ? ? ?Subjective: ?Feels better, breathing continuing to improve, down to 5 L O2 ? ?Assessment & Plan: ? ?Acute on chronic hypoxic respiratory failure ?Recurrent volume overload, ESRD ?Acute on chronic diastolic CHF ?Recurrent right pleural effusion ?Severe PAH ?-Admitted yet again with volume overload, hypoxia, pulmonary edema and increasing right pleural effusion ?-Has had multiple thoracentesis, and went thoracentesis in IR 4/16, 1.1 L drained, transudative ?-Last echo with preserved EF, severe PAH, grade 1 DD ?-Long history of inability to tolerate full dialysis sessions, continues to sign out 1 to 2 hours early ?-Continue midodrine on HD days ?-Seen by palliative care 3 times this year, understands poor prognosis, wishes to continue aggressive care, full code ?-Dialyzed yesterday, plan for repeat HD tomorrow, continue to wean O2 down to 2 to 3 L ? ?ESRD on dialysis on HD MWF-but noncompliant ?Nephrology following, HD tomorrow ? ?Elevated troponin ?-Troponin persistently mildly elevated in setting of acute on chronic HFpEF and ESRD.  EKG without acute ischemic changes.  No significant coronary artery disease on prior cardiac work-up.  Doubt ACS at this time. ? ?Hyperkalemia ?Resolved ? ?Paroxysmal atrial fibrillation (HCC) ?In  sinus rhythm on admission with controlled rate.  Continue diltiazem and Eliquis. ? ?COPD (chronic obstructive pulmonary disease) ([place]) ?No wheezing on admission.  Continue Breo and albuterol as needed. ? ?Anemia of chronic disease ?-Hemoglobin close to baseline, continue EPO with HD ? ?Severe protein calorie malnutrition ? ?DVT prophylaxis: Eliquis ?Code Status: Full code ?Family Communication: Discussed with patient in detail, no family at bedside ?Disposition Plan: Home pending improvement in volume status ? ?Consultants:  ?Nephrology ? ?Procedures:  ? ?Antimicrobials:  ? ? ?Objective: ?Vitals:  ? 12/16/21 1943 12/16/21 2154 12/17/21 0444 12/17/21 0831  ?BP: (!) 148/63  (!) 164/76   ?Pulse: 71  80   ?Resp: 18   16  ?Temp: 98.6 ?F (37 ?C)  98.2 ?F (36.8 ?C)   ?TempSrc: Axillary  Axillary   ?SpO2: 97% 90% 99%   ?Weight:   46.2 kg   ?Height:      ? ? ?Intake/Output Summary (Last 24 hours) at 12/17/2021 1341 ?Last data filed at 12/17/2021 1244 ?Gross per 24 hour  ?Intake 662 ml  ?Output 2120 ml  ?Net -1458 ml  ? ?Filed Weights  ? 12/16/21 1112 12/16/21 1425 12/17/21 0444  ?Weight: 46.3 kg 44 kg 46.2 kg  ? ? ?Examination: ? ?General exam: Chronically ill male sitting up in bed, AAOx3, no distress ?HEENT: Positive JVD ?CVS: S1-S2, regular rhythm ?Lungs: Improved air movement bilaterally, basilar Rales noted ?Abdomen: Soft, nontender, bowel sounds present ?Extremities: No edema  ?Psychiatry: Flat affect ? ? ? ?Data Reviewed:  ? ?CBC: ?Recent Labs  ?Lab 12/10/21 ?2242 12/14/21 ?1553 12/15/21 ?0422 12/16/21 ?1137  ?WBC 5.2 4.3 4.4 4.6  ?NEUTROABS 3.9  --   --   --   ?  HGB 9.2* 9.0* 8.4* 8.0*  ?HCT 29.8* 29.1* 26.1* 26.8*  ?MCV 100.7* 101.4* 98.1 102.3*  ?PLT 153 156 136* 131*  ? ?Basic Metabolic Panel: ?Recent Labs  ?Lab 12/10/21 ?2242 12/14/21 ?1553 12/15/21 ?0422 12/15/21 ?0730 12/16/21 ?1136  ?NA 140 138 136  --  137  ?K 5.9* 5.6* 3.9  --  5.2*  ?CL 99 100 97*  --  98  ?CO2 23 21* 27  --  28  ?GLUCOSE 80 130* 84  --  100*   ?BUN 77* 63* 25*  --  34*  ?CREATININE 9.13* 8.72* 4.80*  --  6.55*  ?CALCIUM 9.1 8.9 8.5*  --  7.9*  ?PHOS  --   --   --  4.9* 6.7*  ? ?GFR: ?Estimated Creatinine Clearance: 7.6 mL/min (A) (by C-G formula based on SCr of 6.55 mg/dL (H)). ?Liver Function Tests: ?Recent Labs  ?Lab 12/10/21 ?2242 12/14/21 ?1553 12/16/21 ?0827 12/16/21 ?1136  ?AST 12* 19  --   --   ?ALT 8 8  --   --   ?ALKPHOS 100 90  --   --   ?BILITOT 1.1 1.4*  --   --   ?PROT 6.7 6.8 6.2*  --   ?ALBUMIN 3.1* 3.1*  --  2.5*  ? ?Recent Labs  ?Lab 12/10/21 ?2242  ?LIPASE 45  ? ?No results for input(s): AMMONIA in the last 168 hours. ?Coagulation Profile: ?Recent Labs  ?Lab 12/10/21 ?2242  ?INR 1.2  ? ?Cardiac Enzymes: ?No results for input(s): CKTOTAL, CKMB, CKMBINDEX, TROPONINI in the last 168 hours. ?BNP (last 3 results) ?No results for input(s): PROBNP in the last 8760 hours. ?HbA1C: ?No results for input(s): HGBA1C in the last 72 hours. ?CBG: ?No results for input(s): GLUCAP in the last 168 hours. ?Lipid Profile: ?No results for input(s): CHOL, HDL, LDLCALC, TRIG, CHOLHDL, LDLDIRECT in the last 72 hours. ?Thyroid Function Tests: ?No results for input(s): TSH, T4TOTAL, FREET4, T3FREE, THYROIDAB in the last 72 hours. ?Anemia Panel: ?No results for input(s): VITAMINB12, FOLATE, FERRITIN, TIBC, IRON, RETICCTPCT in the last 72 hours. ?Urine analysis: ?   ?Component Value Date/Time  ? [place] YELLOW 05/24/2017 1729  ? APPEARANCEUR HAZY (A) 05/24/2017 1729  ? LABSPEC 1.011 05/24/2017 1729  ? PHURINE 5.0 05/24/2017 1729  ? GLUCOSEU 50 (A) 05/24/2017 1729  ? HGBUR SMALL (A) 05/24/2017 1729  ? [place] NEGATIVE 05/24/2017 1729  ? [place] NEGATIVE 05/24/2017 1729  ? PROTEINUR 100 (A) 05/24/2017 1729  ? UROBILINOGEN 0.2 04/09/2015 1527  ? NITRITE NEGATIVE 05/24/2017 1729  ? LEUKOCYTESUR TRACE (A) 05/24/2017 1729  ? ?Sepsis Labs: ?'@LABRCNTIP'$ (procalcitonin:4,lacticidven:4) ? ?) ?Recent Results (from the past 240 hour(s))  ?Resp Panel by RT-PCR (Flu A&B,  Covid) Nasopharyngeal Swab     Status: None  ? Collection Time: 12/14/21  6:30 PM  ? Specimen: Nasopharyngeal Swab; Nasopharyngeal(NP) swabs in vial transport medium  ?Result Value Ref Range Status  ? SARS Coronavirus 2 by RT PCR NEGATIVE NEGATIVE Final  ?  Comment: (NOTE) ?SARS-CoV-2 target nucleic acids are NOT DETECTED. ? ?The SARS-CoV-2 RNA is generally detectable in upper respiratory ?specimens during the acute phase of infection. The lowest ?concentration of SARS-CoV-2 viral copies this assay can detect is ?138 copies/mL. A negative result does not preclude SARS-Cov-2 ?infection and should not be used as the sole basis for treatment or ?other patient management decisions. A negative result may occur with  ?improper specimen collection/handling, submission of specimen other ?than nasopharyngeal swab, presence of viral mutation(s) within the ?areas targeted  by this assay, and inadequate number of viral ?copies(<138 copies/mL). A negative result must be combined with ?clinical observations, patient history, and epidemiological ?information. The expected result is Negative. ? ?Fact Sheet for Patients:  ?EntrepreneurPulse.com.au ? ?Fact Sheet for Healthcare Providers:  ?IncredibleEmployment.be ? ?This test is no t yet approved or cleared by the [place] FDA and  ?has been authorized for detection and/or diagnosis of SARS-CoV-2 by ?FDA under an Emergency Use Authorization (EUA). This EUA will remain  ?in effect (meaning this test can be used) for the duration of the ?COVID-19 declaration under Section 564(b)(1) of the Act, 21 ?U.S.C.section 360bbb-3(b)(1), unless the authorization is terminated  ?or revoked sooner.  ? ? ?  ? Influenza A by PCR NEGATIVE NEGATIVE Final  ? Influenza B by PCR NEGATIVE NEGATIVE Final  ?  Comment: (NOTE) ?The Xpert Xpress SARS-CoV-2/FLU/RSV plus assay is intended as an aid ?in the diagnosis of influenza from Nasopharyngeal swab specimens and ?should  not be used as a sole basis for treatment. Nasal washings and ?aspirates are unacceptable for Xpert Xpress SARS-CoV-2/FLU/RSV ?testing. ? ?Fact Sheet for Patients: ?https://barnett.com/

## 2021-12-17 NOTE — Progress Notes (Signed)
Initial Nutrition Assessment ? ?DOCUMENTATION CODES:  ? ?Underweight, Severe malnutrition in context of chronic illness ? ?INTERVENTION:  ? ?Liberalize pt diet to regular with a fluid restriction due to malnutrition and current diet being restrictive. ?Encourage good PO intake ?Renal Multivitamin w/ minerals daily ?Nepro Shake po BID, each supplement provides 425 kcal and 19 grams protein ? ?NUTRITION DIAGNOSIS:  ? ?Severe Malnutrition related to chronic illness (ESRD, CHF, COPD) as evidenced by severe muscle depletion, severe fat depletion. ? ?GOAL:  ? ?Patient will meet greater than or equal to 90% of their needs ? ?MONITOR:  ? ?PO intake, Supplement acceptance, Labs, Weight trends, Skin, I & O's ? ?REASON FOR ASSESSMENT:  ? ?Malnutrition Screening Tool ?  ? ?ASSESSMENT:  ? ?61 y.o. male presented to the ED with dyspnea. PMH includes COPD, ESRD on HD (MWF), CHF, GERD, malnutrition, and CVA. Pt admitted with CHF exacerbation.  ? ?4/16 - Thoracentesis, 1120 cc removed ? ?Pt sleeping at time of RD visit, woke to RD voice. Pt answers minimal RD questions due to wanting to sleep.  ? ?Pt reports that he eats 3 meal on dialysis and non-dialysis days. Reports that he also drinks 1 Ensure per day at home. Pt denies any N/V with dialysis; had some this morning but improved with medicine.  ?Per EMR, pt intake includes: ?4/16: Breakfast 10%, Lunch 5%, Dinner 50% ?4/17: Breakfast 100%, Dinner 50% ?4/18: Breakfast 100%, Lunch 100% ? ?Pt reports that he does not recall any changes in his EDW at dialysis outpatient. Pt stated that he just cannot gain weight. Pt has lost weight per EMR, although due to ESRD unable to determine dry weight loss versus fluid loss.  ? ?Discussed ONS with pt; pt agreeable to drinking since currently drinks at home. Discussed the importance of good nutrition to promote weight gain.  ? ?Medications reviewed and include: Cinacalcet, Renvela ?Labs reviewed: Potassium 5.2, Phosphorus 6.7  ? ?HD on  4/17 ?EDW: 45.5 kg ?Net UF: 2100 mL ?Pre-Dialysis Weight: 46.3 kg ?Post-Dialysis Weight: 44 kg ? ?NUTRITION - FOCUSED PHYSICAL EXAM: ? ?Flowsheet Row Most Recent Value  ?Orbital Region Severe depletion  ?Upper Arm Region Severe depletion  ?Thoracic and Lumbar Region Severe depletion  ?Buccal Region Severe depletion  ?Temple Region Severe depletion  ?Clavicle Bone Region Severe depletion  ?Clavicle and Acromion Bone Region Severe depletion  ?Scapular Bone Region Severe depletion  ?Dorsal Hand Severe depletion  ?Patellar Region Severe depletion  ?Anterior Thigh Region Severe depletion  ?Posterior Calf Region Severe depletion  ?Edema (RD Assessment) None  ?Hair Reviewed  ?Eyes Unable to assess  [dark room]  ?Mouth Reviewed  ?Skin Reviewed  ?Nails Reviewed  ? ?  ? ? ?Diet Order:   ?Diet Order   ? ?       ?  Diet renal with fluid restriction Fluid restriction: 1200 mL Fluid; Room service appropriate? Yes; Fluid consistency: Thin  Diet effective now       ?  ? ?  ?  ? ?  ? ? ?EDUCATION NEEDS:  ? ?No education needs have been identified at this time ? ?Skin:  Skin Assessment: Skin Integrity Issues: ?Skin Integrity Issues:: Stage II ?Stage II: Sacrum ? ?Last BM:  PTA ? ?Height:  ? ?Ht Readings from Last 1 Encounters:  ?12/14/21 '5\' 8"'$  (1.727 m)  ? ? ?Weight:  ? ?Wt Readings from Last 1 Encounters:  ?12/17/21 46.2 kg  ? ? ?Ideal Body Weight:  70 kg ? ?BMI:  Body mass index  is 15.49 kg/m?. ? ?Estimated Nutritional Needs:  ? ?Kcal:  1500-1700 ? ?Protein:  75-90 grams ? ?Fluid:  UOP + 1L ? ? ? ?Hermina Barters RD, LDN ?Clinical Dietitian ?See AMiON for contact information.  ? ?

## 2021-12-17 NOTE — Progress Notes (Signed)
[facility] ?Progress Note ? ?Subjective: seen in room. 2.1 L off w/ HD yesterday. No new c/o's. Says breathing is better both from HD and from the thoracentesis.  ? ?Vitals:  ? 12/16/21 1550 12/16/21 1943 12/16/21 2154 12/17/21 0444  ?BP: 140/73 (!) 148/63  (!) 164/76  ?Pulse: 71 71  80  ?Resp:  18    ?Temp:  98.6 ?F (37 ?C)  98.2 ?F (36.8 ?C)  ?TempSrc:  Axillary  Axillary  ?SpO2: 97% 97% 90% 99%  ?Weight:    46.2 kg  ?Height:      ? ? ?Exam: ?Gen alert, no distress, frail adult WM, nasal O2 ?No jvd or bruits ?Chest clear L,  R base dec'd BS ?RRR no RG ?Abd soft ntnd no mass or ascites +bs ?Ext no LE edema ?Neuro is alert, Ox 3 , nf ?   RIJ TDC/ maturing RUA AVF ?  ?  ? Home meds include - eliquis 2.5 bid, sensipar 60 hs, cardizem cd 300 qd, ensure, breo ellipta, renavit, sl ntg, oxy IR, zoloft, renvela 1 ac tid, prns/ vits/ supps ?  ? CXR 4/16 - IMPRESSION: Findings consistent with worsening pulmonary edema and enlarging right pleural effusion. ?  ? OP HD: SW MWF ? 3.5h  45.5kg  400/800  2/2 bath P2  RIJ TDC/ maturing RUA AVF   Hep none ? - ave time on machine 1.5 hrs x last 3 ? - mircera 225 q2, last 4/14, due 4/28 ? - no vdra ?  ?  ?Assessment/ Plan: ?Vol overload /SOB/ acute on chronic hypoxic resp failure - worsening pulm edema and R effusion. 2 L off w/ HD yest. Breathing better. On 4-5 L [place] at home.  ?ESRD - on HD MWF. Had HD here Sat night and yesterday. Next HD Wed/ tomorrow.  ?HTN - BP's high normal, on dilt long acting. Just over dry wt. No edema on exam.  ?Atrial fib - on eliquis and cardizem ?MBD ckd - cont sensipar and renvela as binder. CCa in range and phos slightly up. Not on vdra.  ?Anemia ckd - Hb 9s here. Just got esa, no Fe ordered.   ?Recurrent R pleural effusion - per pmd ?HFpEF - w/ SOB and vol excess, as above, improved.  ?  ? ? ? ?[name], hospital ?12/15/2021, 9:08 AM ? ? ?Recent Labs  ?Lab 12/14/21 ?1553 12/15/21 ?0422 12/15/21 ?0730 12/16/21 ?1136 12/16/21 ?1137  ?HGB 9.0* 8.4*   --   --  8.0*  ?ALBUMIN 3.1*  --   --  2.5*  --   ?CALCIUM 8.9 8.5*  --  7.9*  --   ?PHOS  --   --  4.9* 6.7*  --   ?CREATININE 8.72* 4.80*  --  6.55*  --   ?K 5.6* 3.9  --  5.2*  --   ? ? ?Inpatient medications: ? apixaban  2.5 mg Oral BID  ? Chlorhexidine Gluconate Cloth  6 each Topical Q0600  ? cinacalcet  60 mg Oral QPM  ? diltiazem  300 mg Oral q morning  ? feeding supplement  237 mL Oral BID BM  ? fluticasone furoate-vilanterol  1 puff Inhalation Daily  ? sertraline  25 mg Oral Daily  ? sevelamer carbonate  800 mg Oral TID with meals  ? sodium chloride flush  3 mL Intravenous Q12H  ? ? ?acetaminophen **OR** acetaminophen, albuterol, dextromethorphan-guaiFENesin, labetalol, ondansetron (ZOFRAN) IV, oxyCODONE, senna-docusate ? ? ? ? ? ? ?

## 2021-12-18 DIAGNOSIS — I5033 Acute on chronic diastolic (congestive) heart failure: Secondary | ICD-10-CM | POA: Diagnosis not present

## 2021-12-18 LAB — RENAL FUNCTION PANEL
Albumin: 2.5 g/dL — ABNORMAL LOW (ref 3.5–5.0)
Anion gap: 12 (ref 5–15)
BUN: 33 mg/dL — ABNORMAL HIGH (ref 8–23)
CO2: 26 mmol/L (ref 22–32)
Calcium: 8.7 mg/dL — ABNORMAL LOW (ref 8.9–10.3)
Chloride: 97 mmol/L — ABNORMAL LOW (ref 98–111)
Creatinine, Ser: 5.62 mg/dL — ABNORMAL HIGH (ref 0.61–1.24)
GFR, Estimated: 11 mL/min — ABNORMAL LOW (ref >60)
Glucose, Bld: 68 mg/dL — ABNORMAL LOW (ref 70–99)
Phosphorus: 6 mg/dL — ABNORMAL HIGH (ref 2.5–4.6)
Potassium: 4.7 mmol/L (ref 3.5–5.1)
Sodium: 135 mmol/L (ref 135–145)

## 2021-12-18 LAB — CBC WITH DIFFERENTIAL/PLATELET
Abs Immature Granulocytes: 0.01 10*3/uL (ref 0.00–0.07)
Basophils Absolute: 0 10*3/uL (ref 0.0–0.1)
Basophils Relative: 1 %
Eosinophils Absolute: 0.2 10*3/uL (ref 0.0–0.5)
Eosinophils Relative: 4 %
HCT: 28.2 % — ABNORMAL LOW (ref 39.0–52.0)
Hemoglobin: 8.7 g/dL — ABNORMAL LOW (ref 13.0–17.0)
Immature Granulocytes: 0 %
Lymphocytes Relative: 6 %
Lymphs Abs: 0.3 10*3/uL — ABNORMAL LOW (ref 0.7–4.0)
MCH: 31.1 pg (ref 26.0–34.0)
MCHC: 30.9 g/dL (ref 30.0–36.0)
MCV: 100.7 fL — ABNORMAL HIGH (ref 80.0–100.0)
Monocytes Absolute: 0.2 10*3/uL (ref 0.1–1.0)
Monocytes Relative: 4 %
Neutro Abs: 4.8 10*3/uL (ref 1.7–7.7)
Neutrophils Relative %: 85 %
Platelets: 160 10*3/uL (ref 150–400)
RBC: 2.8 MIL/uL — ABNORMAL LOW (ref 4.22–5.81)
RDW: 15.2 % (ref 11.5–15.5)
WBC: 5.6 10*3/uL (ref 4.0–10.5)
nRBC: 0 % (ref 0.0–0.2)

## 2021-12-18 MED ORDER — ALTEPLASE 2 MG IJ SOLR
2.0000 mg | Freq: Once | INTRAMUSCULAR | Status: AC
  Administered 2021-12-18: 2 mg

## 2021-12-18 MED ORDER — ALTEPLASE 2 MG IJ SOLR
INTRAMUSCULAR | Status: AC
  Filled 2021-12-18: qty 2

## 2021-12-18 NOTE — Progress Notes (Signed)
[facility] ?Progress Note ? ?Subjective: seen in HD. No new c/o.  ? ?Vitals:  ? 12/17/21 2030 12/17/21 2040 12/18/21 0200 12/18/21 0238  ?BP:      ?Pulse: 66  69 72  ?Resp:      ?Temp:      ?TempSrc:      ?SpO2: 91% 94% 97% 96%  ?Weight:      ?Height:      ? ? ?Exam: ?Gen alert, no distress, frail adult WM, nasal O2 ?No jvd or bruits ?Chest clear L,  R base dec'd BS ?RRR no RG ?Abd soft ntnd no mass or ascites +bs ?Ext no LE edema ?Neuro is alert, Ox 3 , nf ?   RIJ TDC/ maturing RUA AVF ?  ?  ? Home meds include - eliquis 2.5 bid, sensipar 60 hs, cardizem cd 300 qd, ensure, breo ellipta, renavit, sl ntg, oxy IR, zoloft, renvela 1 ac tid, prns/ vits/ supps ?  ? CXR 4/16 - IMPRESSION: Findings consistent with worsening pulmonary edema and enlarging right pleural effusion. ?  ? OP HD: SW MWF ? 3.5h  45.5kg  400/800  2/2 bath P2  RIJ TDC/ maturing RUA AVF   Hep none ? - ave time on machine 1.5 hrs x last 3 ? - mircera 225 q2, last 4/14, due 4/28 ? - no vdra ?  ?  ?Assessment/ Plan: ?Vol overload /SOB/ acute on chronic hypoxic resp failure - worsening pulm edema and R effusion by admit CXR. Had HD Sat and Monday here, UF 1.0 and 2.1 L. SP R thoracentesis. Breathing better. Baseline O2 support 4-5 L [place] at home. Per pmd.  ?ESRD - on HD MWF. Had HD here Sat and Monday. Next HD today.  ?HTN/ vol  - BP's high normal on long acting diltiazem. At dry wt. No edema on exam. Lower vol w/ HD today as tol.  ?Atrial fib - on eliquis and cardizem ?MBD ckd - cont sensipar and renvela as binder. CCa in range and phos slightly up. Not on vdra.  ?Anemia ckd - Hb 9s here. Just got esa, no Fe ordered.   ?Recurrent R pleural effusion - per pmd ?HFpEF - normal EF last echo.  ?  ? ? ? ?[name], hospital ?12/15/2021, 9:08 AM ? ? ?Recent Labs  ?Lab 12/14/21 ?1553 12/15/21 ?0422 12/15/21 ?0730 12/16/21 ?1136 12/16/21 ?1137  ?HGB 9.0* 8.4*  --   --  8.0*  ?ALBUMIN 3.1*  --   --  2.5*  --   ?CALCIUM 8.9 8.5*  --  7.9*  --   ?PHOS  --   --   4.9* 6.7*  --   ?CREATININE 8.72* 4.80*  --  6.55*  --   ?K 5.6* 3.9  --  5.2*  --   ? ? ?Inpatient medications: ? apixaban  2.5 mg Oral BID  ? Chlorhexidine Gluconate Cloth  6 each Topical Q0600  ? cinacalcet  60 mg Oral QPM  ? diltiazem  300 mg Oral q morning  ? feeding supplement  237 mL Oral BID BM  ? feeding supplement (NEPRO CARB STEADY)  237 mL Oral BID BM  ? fluticasone furoate-vilanterol  1 puff Inhalation Daily  ? multivitamin  1 tablet Oral QHS  ? sertraline  25 mg Oral Daily  ? sevelamer carbonate  800 mg Oral TID with meals  ? sodium chloride flush  3 mL Intravenous Q12H  ? ? ?acetaminophen **OR** acetaminophen, albuterol, dextromethorphan-guaiFENesin, labetalol, ondansetron (ZOFRAN) IV, oxyCODONE, senna-docusate ? ? ? ? ? ? ?

## 2021-12-18 NOTE — Progress Notes (Signed)
Patient refuse all care this morning. Get out of my room when am trying to sleep ?

## 2021-12-18 NOTE — Progress Notes (Signed)
Patient complains of not feeling well and ask to discontinue dialysis. Return blood accordingly and Dr. Brayton El notified. IJ Perm cath dwelled with Cathflo activase due to possible fibrin and  low blood flow rate (300-350cc/min) during dialysis. ?

## 2021-12-18 NOTE — Plan of Care (Signed)
?  Problem: Clinical Measurements: ?Goal: Will remain free from infection ?Outcome: Not Progressing ?Pt. Removed dsg. From HD site, touching. ?  ?

## 2021-12-18 NOTE — Progress Notes (Signed)
Pt returned from HD taken off early and c/o pain upon return generalized. Pain medications given no other needs at this time call bell with in reach  ? ?

## 2021-12-18 NOTE — Progress Notes (Signed)
RN in room at pts. Bedside to complete shift assessment. Pt. Noted to have his hands under the dressing of his HD catheter. RN asked pt. Not to touch the dressing and explained that he could develop an infection. Pt. Told pt. To "just go and get out of my room". Agricultural consultant notified.  ?

## 2021-12-18 NOTE — Progress Notes (Signed)
?PROGRESS NOTE ? ? ? ?Thomas Robinson  PJA:[id number] DOB: 1961-07-18 DOA: 12/14/2021 ?PCP: [name], PA  ?Narrative : Thomas Robinson with history of ESRD on HD MWF, chronic diastolic CHF, chronic respiratory failure on 2 to 3 L O2, chronic pleural effusions, paroxysmal A-fib on Eliquis, anemia of chronic disease, hypertension, depression/anxiety presented to the ED again with dyspnea. ?-4 hospitalizations already this year with volume overload ?-Long history of signing off early at hemodialysis sessions ?-When EMS arrived O2 sats were 84% on 4 to 5 L O2, labs noted troponin of 128, creatinine of 8.7, BNP 2487, chest x-ray noted increased pulmonary edema and enlarging right pleural effusion ? -Underwent thoracentesis 4/16, 1.1 L drained ? ?Subjective: ?-Feels nauseated on dialysis, threw up a little bit earlier ? ?Assessment and Plan: ? ?Acute on chronic hypoxic respiratory failure ?Recurrent volume overload, ESRD ?Acute on chronic diastolic CHF ?Recurrent right pleural effusion ?Severe PAH ?-Admitted yet again with volume overload, hypoxia, pulmonary edema and increasing right pleural effusion ?-Has had multiple thoracentesis, and went thoracentesis in IR 4/16, 1.1 L drained, transudative ?-Last echo with preserved EF, severe PAH, grade 1 DD ?-Long history of inability to tolerate full dialysis sessions, signs of early from hemodialysis  ?-Continue midodrine on HD days ?-Seen by palliative care 3 times this year, understands poor prognosis, wishes to continue aggressive care, full code ?-Dialysis today, wean O2 down to 2 to 3 L as tolerated ? ?Nausea, vomiting ?-This morning on dialysis,, likely related to BP fluctuations ?- labs unremarkable, abdominal exam is benign, supportive care monitor ?  ?ESRD on dialysis on HD MWF-but noncompliant ?Nephrology following, HD tomorrow ?  ?Elevated troponin ?-Troponin persistently mildly elevated in setting of acute on chronic HFpEF and ESRD.  EKG without acute ischemic changes.   No significant coronary artery disease on prior cardiac work-up.  Doubt ACS at this time. ?  ?Hyperkalemia ?Resolved ?  ?Paroxysmal atrial fibrillation (HCC) ?In sinus rhythm on admission with controlled rate.  Continue diltiazem and Eliquis. ?  ?COPD (chronic obstructive pulmonary disease) ([place]) ?No wheezing on admission.  Continue Breo and albuterol as needed. ?  ?Anemia of chronic disease ?-Hemoglobin close to baseline, continue EPO with HD ?  ?Severe protein calorie malnutrition ?  ?DVT prophylaxis: Eliquis ?Code Status: Full code ?Family Communication: Discussed with patient in detail, no family at bedside ?Disposition Plan: Home pending improvement in volume status, nausea vomiting ?  ? ?Consultants:  ? ? ?Procedures:  ? ?Antimicrobials:  ? ? ?Objective: ?Vitals:  ? 12/18/21 0930 12/18/21 1000 12/18/21 1030 12/18/21 1031  ?BP: 132/68 (!) 159/81 (!) 160/85 (!) 154/80  ?Pulse:  94 97 80  ?Resp:    13  ?Temp:    97.6 ?F (36.4 ?C)  ?TempSrc:    Temporal  ?SpO2:    98%  ?Weight:    45 kg  ?Height:      ? ? ?Intake/Output Summary (Last 24 hours) at 12/18/2021 1109 ?Last data filed at 12/18/2021 1031 ?Gross per 24 hour  ?Intake 120 ml  ?Output 1721 ml  ?Net -1601 ml  ? ?Filed Weights  ? 12/17/21 0444 12/18/21 0744 12/18/21 1031  ?Weight: 46.2 kg 46.7 kg 45 kg  ? ? ?Examination: ? ?Chronically ill male seen on dialysis, AAO x3, no distress ?HEENT: Positive JVD ?CVS: S1-S2, regular rhythm ?Lungs: Few basilar rales, otherwise clear ?Abdomen: Soft, nontender, bowel sounds present ?Extremities: No edema ?Psych: Flat affect ? ? ? ?Data Reviewed:  ? ?CBC: ?Recent Labs  ?Lab 12/14/21 ?[place]  12/15/21 ?0422 12/16/21 ?1137 12/18/21 ?8185  ?WBC 4.3 4.4 4.6 5.6  ?NEUTROABS  --   --   --  4.8  ?HGB 9.0* 8.4* 8.0* 8.7*  ?HCT 29.1* 26.1* 26.8* 28.2*  ?MCV 101.4* 98.1 102.3* 100.7*  ?PLT 156 136* 131* 160  ? ?Basic Metabolic Panel: ?Recent Labs  ?Lab 12/14/21 ?1553 12/15/21 ?0422 12/15/21 ?0730 12/16/21 ?1136 12/18/21 ?6314  ?NA 138 136   --  137 135  ?K 5.6* 3.9  --  5.2* 4.7  ?CL 100 97*  --  98 97*  ?CO2 21* 27  --  28 26  ?GLUCOSE 130* 84  --  100* 68*  ?BUN 63* 25*  --  34* 33*  ?CREATININE 8.72* 4.80*  --  6.55* 5.62*  ?CALCIUM 8.9 8.5*  --  7.9* 8.7*  ?PHOS  --   --  4.9* 6.7* 6.0*  ? ?GFR: ?Estimated Creatinine Clearance: 8.7 mL/min (A) (by C-G formula based on SCr of 5.62 mg/dL (H)). ?Liver Function Tests: ?Recent Labs  ?Lab 12/14/21 ?1553 12/16/21 ?0827 12/16/21 ?1136 12/18/21 ?9702  ?AST 19  --   --   --   ?ALT 8  --   --   --   ?ALKPHOS 90  --   --   --   ?BILITOT 1.4*  --   --   --   ?PROT 6.8 6.2*  --   --   ?ALBUMIN 3.1*  --  2.5* 2.5*  ? ?No results for input(s): LIPASE, AMYLASE in the last 168 hours. ?No results for input(s): AMMONIA in the last 168 hours. ?Coagulation Profile: ?No results for input(s): INR, PROTIME in the last 168 hours. ?Cardiac Enzymes: ?No results for input(s): CKTOTAL, CKMB, CKMBINDEX, TROPONINI in the last 168 hours. ?BNP (last 3 results) ?No results for input(s): PROBNP in the last 8760 hours. ?HbA1C: ?No results for input(s): HGBA1C in the last 72 hours. ?CBG: ?No results for input(s): GLUCAP in the last 168 hours. ?Lipid Profile: ?No results for input(s): CHOL, HDL, LDLCALC, TRIG, CHOLHDL, LDLDIRECT in the last 72 hours. ?Thyroid Function Tests: ?No results for input(s): TSH, T4TOTAL, FREET4, T3FREE, THYROIDAB in the last 72 hours. ?Anemia Panel: ?No results for input(s): VITAMINB12, FOLATE, FERRITIN, TIBC, IRON, RETICCTPCT in the last 72 hours. ?Urine analysis: ?   ?Component Value Date/Time  ? [place] YELLOW 05/24/2017 1729  ? APPEARANCEUR HAZY (A) 05/24/2017 1729  ? LABSPEC 1.011 05/24/2017 1729  ? PHURINE 5.0 05/24/2017 1729  ? GLUCOSEU 50 (A) 05/24/2017 1729  ? HGBUR SMALL (A) 05/24/2017 1729  ? [place] NEGATIVE 05/24/2017 1729  ? [place] NEGATIVE 05/24/2017 1729  ? PROTEINUR 100 (A) 05/24/2017 1729  ? UROBILINOGEN 0.2 04/09/2015 1527  ? NITRITE NEGATIVE 05/24/2017 1729  ? LEUKOCYTESUR TRACE  (A) 05/24/2017 1729  ? ?Sepsis Labs: ?'@LABRCNTIP'$ (procalcitonin:4,lacticidven:4) ? ?) ?Recent Results (from the past 240 hour(s))  ?Resp Panel by RT-PCR (Flu A&B, Covid) Nasopharyngeal Swab     Status: None  ? Collection Time: 12/14/21  6:30 PM  ? Specimen: Nasopharyngeal Swab; Nasopharyngeal(NP) swabs in vial transport medium  ?Result Value Ref Range Status  ? SARS Coronavirus 2 by RT PCR NEGATIVE NEGATIVE Final  ?  Comment: (NOTE) ?SARS-CoV-2 target nucleic acids are NOT DETECTED. ? ?The SARS-CoV-2 RNA is generally detectable in upper respiratory ?specimens during the acute phase of infection. The lowest ?concentration of SARS-CoV-2 viral copies this assay can detect is ?138 copies/mL. A negative result does not preclude SARS-Cov-2 ?infection and should not be used as the sole  basis for treatment or ?other patient management decisions. A negative result may occur with  ?improper specimen collection/handling, submission of specimen other ?than nasopharyngeal swab, presence of viral mutation(s) within the ?areas targeted by this assay, and inadequate number of viral ?copies(<138 copies/mL). A negative result must be combined with ?clinical observations, patient history, and epidemiological ?information. The expected result is Negative. ? ?Fact Sheet for Patients:  ?EntrepreneurPulse.com.au ? ?Fact Sheet for Healthcare Providers:  ?IncredibleEmployment.be ? ?This test is no t yet approved or cleared by the [place] FDA and  ?has been authorized for detection and/or diagnosis of SARS-CoV-2 by ?FDA under an Emergency Use Authorization (EUA). This EUA will remain  ?in effect (meaning this test can be used) for the duration of the ?COVID-19 declaration under Section 564(b)(1) of the Act, 21 ?U.S.C.section 360bbb-3(b)(1), unless the authorization is terminated  ?or revoked sooner.  ? ? ?  ? Influenza A by PCR NEGATIVE NEGATIVE Final  ? Influenza B by PCR NEGATIVE NEGATIVE Final  ?   Comment: (NOTE) ?The Xpert Xpress SARS-CoV-2/FLU/RSV plus assay is intended as an aid ?in the diagnosis of influenza from Nasopharyngeal swab specimens and ?should not be used as a sole basis for treat

## 2021-12-19 DIAGNOSIS — I5033 Acute on chronic diastolic (congestive) heart failure: Secondary | ICD-10-CM | POA: Diagnosis not present

## 2021-12-19 NOTE — Plan of Care (Signed)
  Problem: Education: Goal: Knowledge of General Education information will improve Description: Including pain rating scale, medication(s)/side effects and non-pharmacologic comfort measures Outcome: Not Progressing   Problem: Health Behavior/Discharge Planning: Goal: Ability to manage health-related needs will improve Outcome: Not Progressing   

## 2021-12-19 NOTE — Care Management Important Message (Signed)
Important Message ? ?Patient Details  ?Name: Thomas Robinson ?MRN: [id number] ?Date of Birth: 1961/07/17 ? ? ?Medicare Important Message Given:  Yes ? ? ? ? ?Shelda Altes ?12/19/2021, 9:01 AM ?

## 2021-12-19 NOTE — Progress Notes (Signed)
[facility] ?Progress Note ? ?Subjective: seen in HD. No new c/o.  ? ?Vitals:  ? 12/18/21 1030 12/18/21 1031 12/18/21 2045 12/19/21 0344  ?BP: (!) 160/85 (!) 154/80 124/74 135/69  ?Pulse: 97 80 88 77  ?Resp:  '13 14 15  '$ ?Temp:  97.6 ?F (36.4 ?C) 98.6 ?F (37 ?C) 98.6 ?F (37 ?C)  ?TempSrc:  Temporal Oral Oral  ?SpO2:  98% 97% 98%  ?Weight:  45 kg  44 kg  ?Height:      ? ? ?Exam: ?Gen alert, no distress, frail adult WM, nasal O2 ?No jvd or bruits ?Chest clear L,  R base dec'd BS ?RRR no RG ?Abd soft ntnd no mass or ascites +bs ?Ext no LE edema ?Neuro is alert, Ox 3 , nf ?   RIJ TDC/ maturing RUA AVF ?  ?  ? Home meds include - eliquis 2.5 bid, sensipar 60 hs, cardizem cd 300 qd, ensure, breo ellipta, renavit, sl ntg, oxy IR, zoloft, renvela 1 ac tid, prns/ vits/ supps ?  ? CXR 4/15 > worsening pulmonary edema and enlarging right pleural effusion. ?  CXR 4/16 > lowered effusion and improved pulm edema (after HD and thora) ?  ? OP HD: SW MWF ? 3.5h  45.5kg  400/800  2/2 bath P2  RIJ TDC/ maturing RUA AVF   Hep none ? - ave time on machine 1.5 hrs x last 3 ? - mircera 225 q2, last 4/14, due 4/28 ? - no vdra ?  ?  ?Assessment/ Plan: ?Vol overload /SOB/ acute on chronic hypoxic resp failure - worsening pulm edema and R effusion by admit CXR. Has had HD x 3 here w/ mean UF 1.8 L x 3. SP R thoracentesis. Breathing better overall. Actually under dry wt today (1.5kg). However c/o feeling just tired overall now, not SOB so much. Baseline O2 support 4-5 L [place] at home. Per pmd.  ?ESRD - on HD MWF. Next HD tomorrow.  ?HTN  - BP's high normal on long acting diltiazem ?Atrial fib - on eliquis and cardizem ?MBD ckd - cont sensipar and renvela as binder. CCa in range and phos slightly up. Not on vdra.  ?Anemia ckd - Hb 8s here. Just got esa, next due 4/28. Will check fe tibc.  ?Recurrent R pleural effusion - per pmd ?HFpEF - normal EF last echo.  ?  ? ? ? ?[name], hospital ?12/15/2021, 9:08 AM ? ? ?Recent Labs  ?Lab  12/16/21 ?1136 12/16/21 ?1137 12/18/21 ?0807 12/18/21 ?5631  ?HGB  --  8.0* 8.7*  --   ?ALBUMIN 2.5*  --   --  2.5*  ?CALCIUM 7.9*  --   --  8.7*  ?PHOS 6.7*  --   --  6.0*  ?CREATININE 6.55*  --   --  5.62*  ?K 5.2*  --   --  4.7  ? ? ?Inpatient medications: ? apixaban  2.5 mg Oral BID  ? Chlorhexidine Gluconate Cloth  6 each Topical Q0600  ? cinacalcet  60 mg Oral QPM  ? diltiazem  300 mg Oral q morning  ? feeding supplement  237 mL Oral BID BM  ? feeding supplement (NEPRO CARB STEADY)  237 mL Oral BID BM  ? fluticasone furoate-vilanterol  1 puff Inhalation Daily  ? multivitamin  1 tablet Oral QHS  ? sertraline  25 mg Oral Daily  ? sevelamer carbonate  800 mg Oral TID with meals  ? sodium chloride flush  3 mL Intravenous Q12H  ? ? ?  acetaminophen **OR** acetaminophen, albuterol, dextromethorphan-guaiFENesin, labetalol, ondansetron (ZOFRAN) IV, oxyCODONE, senna-docusate ? ? ? ? ? ? ?

## 2021-12-19 NOTE — Progress Notes (Signed)
?PROGRESS NOTE ? ? ? ?Thomas Robinson  VOZ:[id number] DOB: 04-14-1961 DOA: 12/14/2021 ?PCP: [name], PA  ?Narrative : 60/M with history of ESRD on HD MWF, chronic diastolic CHF, chronic respiratory failure on 2 to 3 L O2, chronic pleural effusions, paroxysmal A-fib on Eliquis, anemia of chronic disease, hypertension, depression/anxiety presented to the ED again with dyspnea. ?-4 hospitalizations already this year with volume overload ?-Long history of signing off early at hemodialysis sessions ?-When EMS arrived O2 sats were 84% on 4 to 5 L O2, labs noted troponin of 128, creatinine of 8.7, BNP 2487, chest x-ray noted increased pulmonary edema and enlarging right pleural effusion ? -Underwent thoracentesis 4/16, 1.1 L drained ? ?Subjective: ?-Reports feeling unwell overall, some dyspnea, still requiring 4 to 5 L of oxygen ? ?Assessment and Plan: ? ?Acute on chronic hypoxic respiratory failure ?Recurrent volume overload, ESRD ?Acute on chronic diastolic CHF ?Recurrent right pleural effusion ?Severe PAH ?-Admitted yet again with volume overload, hypoxia, pulmonary edema and increasing right pleural effusion ?-Has had multiple thoracentesis, and went thoracentesis in IR 4/16, 1.1 L drained, transudative ?-Last echo with preserved EF, severe PAH, grade 1 DD ?-Long history of inability to tolerate full dialysis sessions, signs of early from hemodialysis  ?-Continue midodrine on HD days ?-Dialyzed yesterday, had to be taken off machine early yet again ?-Seen by palliative care 3 times this year, understands poor prognosis, wishes to continue aggressive care, full code ?-Still appears volume overloaded and requiring more oxygen than baseline, hopefully can go home after HD tomorrow, anticipate quick readmission given previous patterns ? ?Nausea, vomiting ?-yesterday on dialysis,, likely related to BP fluctuations ?- labs unremarkable, abdominal exam is benign, supportive care monitor ?  ?ESRD on dialysis on HD  MWF-but noncompliant ?Nephrology following, HD tomorrow ?  ?Elevated troponin ?-Troponin persistently mildly elevated in setting of acute on chronic HFpEF and ESRD.  EKG without acute ischemic changes.  No significant coronary artery disease on prior cardiac work-up. ?  ?Hyperkalemia ?Resolved ?  ?Paroxysmal atrial fibrillation (HCC) ?In sinus rhythm on admission with controlled rate.  Continue diltiazem and Eliquis. ?  ?COPD (chronic obstructive pulmonary disease) ([place]) ?No wheezing on admission.  Continue Breo and albuterol as needed. ?  ?Anemia of chronic disease ?-Hemoglobin close to baseline, continue EPO with HD ?  ?Severe protein calorie malnutrition ?  ?DVT prophylaxis: Eliquis ?Code Status: Full code ?Family Communication: Discussed with patient in detail, no family at bedside ?Disposition Plan: Home tomorrow, pending improvement in symptoms ?  ? ?Consultants:  ?Nephrology ? ?Procedures:  ? ?Antimicrobials:  ? ? ?Objective: ?Vitals:  ? 12/18/21 2045 12/19/21 0344 12/19/21 4259 12/19/21 0858  ?BP: 124/74 135/69  (!) 148/91  ?Pulse: 88 77  89  ?Resp: '14 15  18  '$ ?Temp: 98.6 ?F (37 ?C) 98.6 ?F (37 ?C)  97.9 ?F (36.6 ?C)  ?TempSrc: Oral Oral  Oral  ?SpO2: 97% 98% 96% 95%  ?Weight:  44 kg    ?Height:      ? ? ?Intake/Output Summary (Last 24 hours) at 12/19/2021 1035 ?Last data filed at 12/19/2021 0813 ?Gross per 24 hour  ?Intake 300 ml  ?Output --  ?Net 300 ml  ? ?Filed Weights  ? 12/18/21 0744 12/18/21 1031 12/19/21 0344  ?Weight: 46.7 kg 45 kg 44 kg  ? ? ?Examination: ? ?Chronically ill male cachectic, sitting up in bed, AAOx3, no distress ?HEENT: Positive JVD ?CVS: S1-S2, regular rate rhythm ?Lungs: Few basilar rales, otherwise clear ?Abdomen: Soft, nontender, bowel  sounds present  ?Extremities: No edema ?Psych: Flat affect ? ? ? ?Data Reviewed:  ? ?CBC: ?Recent Labs  ?Lab 12/14/21 ?1553 12/15/21 ?0422 12/16/21 ?1137 12/18/21 ?6606  ?WBC 4.3 4.4 4.6 5.6  ?NEUTROABS  --   --   --  4.8  ?HGB 9.0* 8.4* 8.0* 8.7*   ?HCT 29.1* 26.1* 26.8* 28.2*  ?MCV 101.4* 98.1 102.3* 100.7*  ?PLT 156 136* 131* 160  ? ?Basic Metabolic Panel: ?Recent Labs  ?Lab 12/14/21 ?1553 12/15/21 ?0422 12/15/21 ?0730 12/16/21 ?1136 12/18/21 ?3016  ?NA 138 136  --  137 135  ?K 5.6* 3.9  --  5.2* 4.7  ?CL 100 97*  --  98 97*  ?CO2 21* 27  --  28 26  ?GLUCOSE 130* 84  --  100* 68*  ?BUN 63* 25*  --  34* 33*  ?CREATININE 8.72* 4.80*  --  6.55* 5.62*  ?CALCIUM 8.9 8.5*  --  7.9* 8.7*  ?PHOS  --   --  4.9* 6.7* 6.0*  ? ?GFR: ?Estimated Creatinine Clearance: 8.5 mL/min (A) (by C-G formula based on SCr of 5.62 mg/dL (H)). ?Liver Function Tests: ?Recent Labs  ?Lab 12/14/21 ?1553 12/16/21 ?0827 12/16/21 ?1136 12/18/21 ?0109  ?AST 19  --   --   --   ?ALT 8  --   --   --   ?ALKPHOS 90  --   --   --   ?BILITOT 1.4*  --   --   --   ?PROT 6.8 6.2*  --   --   ?ALBUMIN 3.1*  --  2.5* 2.5*  ? ?No results for input(s): LIPASE, AMYLASE in the last 168 hours. ?No results for input(s): AMMONIA in the last 168 hours. ?Coagulation Profile: ?No results for input(s): INR, PROTIME in the last 168 hours. ?Cardiac Enzymes: ?No results for input(s): CKTOTAL, CKMB, CKMBINDEX, TROPONINI in the last 168 hours. ?BNP (last 3 results) ?No results for input(s): PROBNP in the last 8760 hours. ?HbA1C: ?No results for input(s): HGBA1C in the last 72 hours. ?CBG: ?No results for input(s): GLUCAP in the last 168 hours. ?Lipid Profile: ?No results for input(s): CHOL, HDL, LDLCALC, TRIG, CHOLHDL, LDLDIRECT in the last 72 hours. ?Thyroid Function Tests: ?No results for input(s): TSH, T4TOTAL, FREET4, T3FREE, THYROIDAB in the last 72 hours. ?Anemia Panel: ?No results for input(s): VITAMINB12, FOLATE, FERRITIN, TIBC, IRON, RETICCTPCT in the last 72 hours. ?Urine analysis: ?   ?Component Value Date/Time  ? [place] YELLOW 05/24/2017 1729  ? APPEARANCEUR HAZY (A) 05/24/2017 1729  ? LABSPEC 1.011 05/24/2017 1729  ? PHURINE 5.0 05/24/2017 1729  ? GLUCOSEU 50 (A) 05/24/2017 1729  ? HGBUR SMALL (A)  05/24/2017 1729  ? [employer] NEGATIVE 05/24/2017 1729  ? [place] NEGATIVE 05/24/2017 1729  ? PROTEINUR 100 (A) 05/24/2017 1729  ? UROBILINOGEN 0.2 04/09/2015 1527  ? NITRITE NEGATIVE 05/24/2017 1729  ? LEUKOCYTESUR TRACE (A) 05/24/2017 1729  ? ?Sepsis Labs: ?'@LABRCNTIP'$ (procalcitonin:4,lacticidven:4) ? ?) ?Recent Results (from the past 240 hour(s))  ?Resp Panel by RT-PCR (Flu A&B, Covid) Nasopharyngeal Swab     Status: None  ? Collection Time: 12/14/21  6:30 PM  ? Specimen: Nasopharyngeal Swab; Nasopharyngeal(NP) swabs in vial transport medium  ?Result Value Ref Range Status  ? SARS Coronavirus 2 by RT PCR NEGATIVE NEGATIVE Final  ?  Comment: (NOTE) ?SARS-CoV-2 target nucleic acids are NOT DETECTED. ? ?The SARS-CoV-2 RNA is generally detectable in upper respiratory ?specimens during the acute phase of infection. The lowest ?concentration of SARS-CoV-2 viral copies  this assay can detect is ?138 copies/mL. A negative result does not preclude SARS-Cov-2 ?infection and should not be used as the sole basis for treatment or ?other patient management decisions. A negative result may occur with  ?improper specimen collection/handling, submission of specimen other ?than nasopharyngeal swab, presence of viral mutation(s) within the ?areas targeted by this assay, and inadequate number of viral ?copies(<138 copies/mL). A negative result must be combined with ?clinical observations, patient history, and epidemiological ?information. The expected result is Negative. ? ?Fact Sheet for Patients:  ?EntrepreneurPulse.com.au ? ?Fact Sheet for Healthcare Providers:  ?IncredibleEmployment.be ? ?This test is no t yet approved or cleared by the [place] FDA and  ?has been authorized for detection and/or diagnosis of SARS-CoV-2 by ?FDA under an Emergency Use Authorization (EUA). This EUA will remain  ?in effect (meaning this test can be used) for the duration of the ?COVID-19 declaration under  Section 564(b)(1) of the Act, 21 ?U.S.C.section 360bbb-3(b)(1), unless the authorization is terminated  ?or revoked sooner.  ? ? ?  ? Influenza A by PCR NEGATIVE NEGATIVE Final  ? Influenza B by PCR NEGATIVE NEGATIVE

## 2021-12-19 NOTE — Progress Notes (Signed)
Pt. Refused to have labs drawn this am. Stated he will let lab tech come back at [place]. ?

## 2021-12-19 NOTE — Progress Notes (Addendum)
Pt receives out-pt HD at [facility] SW on MWF. Pt arrives at 1:00 for 1:20 chair time. Will assist as needed.  ? ?Thomas Robinson ?Renal Navigator ?[phone] ? ?Addendum at 3:28 pm: ?Communicated with attending about pt's possible d/c date. Pt may be stable for d/c in the morning if pt's breathing is improved (and pt medically stable otherwise). Contacted [facility] SW to make them aware that pt may d/c in the am with plan to receive out-pt HD at 1:00 (pt's regular arrival time). Navigator to f/u with attending in the morning regarding pt's stability for d/c. Contacted inpt HD unit with request that pt be placed on 2nd shift in the event pt is not stable for d/c in the morning to go to out-pt clinic.  ?

## 2021-12-20 DIAGNOSIS — I5033 Acute on chronic diastolic (congestive) heart failure: Secondary | ICD-10-CM | POA: Diagnosis not present

## 2021-12-20 LAB — CBC
HCT: 28 % — ABNORMAL LOW (ref 39.0–52.0)
Hemoglobin: 9.1 g/dL — ABNORMAL LOW (ref 13.0–17.0)
MCH: 31.8 pg (ref 26.0–34.0)
MCHC: 32.5 g/dL (ref 30.0–36.0)
MCV: 97.9 fL (ref 80.0–100.0)
Platelets: 166 10*3/uL (ref 150–400)
RBC: 2.86 MIL/uL — ABNORMAL LOW (ref 4.22–5.81)
RDW: 15.1 % (ref 11.5–15.5)
WBC: 4.8 10*3/uL (ref 4.0–10.5)
nRBC: 0 % (ref 0.0–0.2)

## 2021-12-20 LAB — BASIC METABOLIC PANEL
Anion gap: 13 (ref 5–15)
BUN: 37 mg/dL — ABNORMAL HIGH (ref 8–23)
CO2: 24 mmol/L (ref 22–32)
Calcium: 8.7 mg/dL — ABNORMAL LOW (ref 8.9–10.3)
Chloride: 98 mmol/L (ref 98–111)
Creatinine, Ser: 6.03 mg/dL — ABNORMAL HIGH (ref 0.61–1.24)
GFR, Estimated: 10 mL/min — ABNORMAL LOW (ref >60)
Glucose, Bld: 98 mg/dL (ref 70–99)
Potassium: 4.8 mmol/L (ref 3.5–5.1)
Sodium: 135 mmol/L (ref 135–145)

## 2021-12-20 LAB — IRON AND TIBC
Iron: 19 ug/dL — ABNORMAL LOW (ref 45–182)
Saturation Ratios: 13 % — ABNORMAL LOW (ref 17.9–39.5)
TIBC: 143 ug/dL — ABNORMAL LOW (ref 250–450)
UIBC: 124 ug/dL

## 2021-12-20 LAB — CULTURE, BODY FLUID W GRAM STAIN -BOTTLE: Culture: NO GROWTH

## 2021-12-20 MED ORDER — HEPARIN SODIUM (PORCINE) 1000 UNIT/ML IJ SOLN
3800.0000 [IU] | INTRAMUSCULAR | Status: DC | PRN
  Filled 2021-12-20: qty 3.8

## 2021-12-20 MED ORDER — HEPARIN SODIUM (PORCINE) 1000 UNIT/ML IJ SOLN
INTRAMUSCULAR | Status: AC
  Administered 2021-12-20: 3800 [IU]
  Filled 2021-12-20: qty 4

## 2021-12-20 NOTE — Discharge Summary (Signed)
Physician Discharge Summary  ?Thomas Robinson NFA:[id number] DOB: October 06, 1960 DOA: 12/14/2021 ? ?PCP: [name], PA ? ?Admit date: 12/14/2021 ?Discharge date: 12/20/2021 ? ?Time spent: 45 minutes ? ?Recommendations for Outpatient Follow-up:  ?PCP in 1 week ?Hemodialysis Monday ?Anticipate quick readmission ?Continue palliative care follow-up ? ? ?Discharge Diagnoses:  ?Principal Problem: ?  Acute on chronic heart failure with preserved ejection fraction (HFpEF) ([place]) ?Active Problems: ?  Recurrent right pleural effusion ?  ESRD on dialysis on HD MWF-but noncompliant ?  Pleural effusion, bilateral ?  Chest pain ?  Acute and chronic respiratory failure with hypoxia (HCC) ?  Hyperkalemia ?  Paroxysmal atrial fibrillation (HCC) ?  Anemia of chronic disease ?  Acute hypoxemic respiratory failure ([place]) ?  Protein-calorie malnutrition, severe ([place]) ?  COPD (chronic obstructive pulmonary disease) ([place]) ? ? ?Discharge Condition: Stable ? ?Diet recommendation: Low-sodium, fluid restriction ? ?Filed Weights  ? 12/20/21 0436 12/20/21 0813 12/20/21 1033  ?Weight: 44.6 kg 45 kg 43.6 kg  ? ? ?History of present illness:  ?61/M with history of ESRD on HD MWF, chronic diastolic CHF, chronic respiratory failure on 2 to 3 L O2, chronic pleural effusions, paroxysmal A-fib on Eliquis, anemia of chronic disease, hypertension, depression/anxiety presented to the ED again with dyspnea. ?-4 hospitalizations already this year with volume overload ?-Long history of signing off early at hemodialysis sessions ?-When EMS arrived O2 sats were 84% on 4 to 5 L O2, labs noted troponin of 128, creatinine of 8.7, BNP 2487, chest x-ray noted increased pulmonary edema and enlarging right pleural effusion ? ? ?Hospital Course:  ? ?Acute on chronic hypoxic respiratory failure ?Recurrent volume overload, ESRD ?Acute on chronic diastolic CHF ?Recurrent right pleural effusion ?Severe PAH ?-Admitted yet again with volume overload, hypoxia, pulmonary edema  and increasing right pleural effusion ?-Has had multiple thoracentesis, and went thoracentesis in IR 4/16, 1.1 L drained, transudative ?-Last echo with preserved EF, severe PAH, grade 1 DD ?-Long history of inability to tolerate full dialysis sessions, signs of early from hemodialysis on account of numerous symptoms ?-Continue midodrine on HD days ?-Dialyzed 4/19 and again today, unfortunately had to be taken off the machine early each time ?-Seen by palliative care 3 times this year, understands poor prognosis, wishes to continue aggressive care, full code ?-Improved and stable, weight down 6 kg this admission to 43.6 which is below his dry weight, O2 weaned down to 4 L, he uses 3 to 4 L at baseline ?Anticipate quick readmission ?  ?ESRD on dialysis on HD MWF-but noncompliant ?Nephrology following, dialyzed today, see discussion above regarding poor compliance with full dialysis treatments, signs of early every time ?  ?Elevated troponin ?-Troponin persistently mildly elevated in setting of acute on chronic HFpEF and ESRD.  EKG without acute ischemic changes.  No significant coronary artery disease on prior cardiac work-up. ?  ?Hyperkalemia ?Resolved ?  ?Paroxysmal atrial fibrillation (HCC) ?In sinus rhythm on admission with controlled rate.  Continue diltiazem and Eliquis. ?  ?COPD (chronic obstructive pulmonary disease) ([place]) ?No wheezing on admission.  Continue Breo and albuterol as needed. ?  ?Anemia of chronic disease ?-Hemoglobin close to baseline, continue EPO with HD ?  ?Severe protein calorie malnutrition ? ?Discharge Exam: ?Vitals:  ? 12/20/21 1055 12/20/21 1109  ?BP: (!) 129/58 126/63  ?Pulse:  78  ?Resp:  18  ?Temp:  97.9 ?F (36.6 ?C)  ?SpO2:  92%  ? ?Chronically ill male cachectic, sitting up in bed, AAOx3, no distress ?HEENT: Positive JVD ?  CVS: S1-S2, regular rate rhythm ?Lungs: Few basilar rales, otherwise clear ?Abdomen: Soft, nontender, bowel sounds present  ?Extremities: No edema ?Psych: Flat  affect ? ? ?Discharge Instructions ? ? ?Discharge Instructions   ? ? Diet - low sodium heart healthy   Complete by: As directed ?  ? Discharge wound care:   Complete by: As directed ?  ? routine  ? Increase activity slowly   Complete by: As directed ?  ? ?  ? ?Allergies as of 12/20/2021   ? ?   Reactions  ? Vancomycin Shortness Of Breath, Itching  ? ?  ? ?  ?Medication List  ?  ? ?TAKE these medications   ? ?acetaminophen 500 MG tablet ?Commonly known as: TYLENOL ?Take 1,000 mg by mouth every 6 (six) hours as needed for moderate pain (pain). ?  ?albuterol 108 (90 Base) MCG/ACT inhaler ?Commonly known as: VENTOLIN HFA ?Inhale 2 puffs into the lungs 2 (two) times daily as needed for shortness of breath or wheezing. ?  ?apixaban 2.5 MG Tabs tablet ?Commonly known as: ELIQUIS ?Take 1 tablet (2.5 mg total) by mouth 2 (two) times daily. ?  ?B-Complex/B-12 Tabs ?Take 1 tablet by mouth daily. ?  ?cinacalcet 60 MG tablet ?Commonly known as: SENSIPAR ?Take 1 tablet (60 mg total) by mouth every evening. ?  ?diltiazem 300 MG 24 hr capsule ?Commonly known as: CARDIZEM CD ?Take 1 capsule (300 mg total) by mouth daily. ?What changed: when to take this ?  ?ethyl chloride spray ?Apply 1 application. topically 3 (three) times a week. ?  ?feeding supplement Liqd ?Take 237 mLs by mouth 2 (two) times daily between meals. ?What changed: when to take this ?  ?fluticasone furoate-vilanterol 100-25 MCG/ACT Aepb ?Commonly known as: BREO ELLIPTA ?Inhale 1 puff into the lungs daily as needed (For shortness of breath). ?  ?multivitamin Tabs tablet ?Take 1 tablet by mouth every morning. ?  ?nitroGLYCERIN 0.4 MG SL tablet ?Commonly known as: NITROSTAT ?Place 1 tablet (0.4 mg total) under the tongue every 5 (five) minutes as needed for chest pain. ?  ?oxyCODONE 5 MG immediate release tablet ?Commonly known as: Oxy IR/ROXICODONE ?Take 1 tablet (5 mg total) by mouth every 6 (six) hours as needed for severe pain. ?  ?sevelamer carbonate 800 MG  tablet ?Commonly known as: RENVELA ?Take 800 mg by mouth See admin instructions. Take one tablet (800 mg) by mouth three times daily - morning, evening and bedtime ?  ?VITAMIN D3 PO ?Take 1 capsule by mouth See admin instructions. Twice a week - [place] ?  ? ?  ? ?  ?  ? ? ?  ?Discharge Care Instructions  ?(From admission, onward)  ?  ? ? ?  ? ?  Start     Ordered  ? 12/20/21 0000  Discharge wound care:       ?Comments: routine  ? 12/20/21 1121  ? ?  ?  ? ?  ? ?Allergies  ?Allergen Reactions  ? Vancomycin Shortness Of Breath and Itching  ? ? Follow-up Information   ? ? [facility]. Follow up.   ?Why: [facility] will contact with apt times ?Contact information: ?[address] ?[place] [address] ?[phone] ? ? ?  ?  ? ?  ?  ? ?  ? ? ? ?The results of significant diagnostics from this hospitalization (including imaging, microbiology, ancillary and laboratory) are listed below for reference.   ? ?Significant Diagnostic Studies: ?  DG Chest 1 View ? ?Result Date: 12/15/2021 ?CLINICAL DATA:  61 year old male status post ultrasound-guided right side thoracentesis this morning. EXAM: CHEST  1 VIEW COMPARISON:  Portable chest 12/14/2021 and earlier. FINDINGS: Upright AP view at 0955 hours. No pneumothorax. Volume of right pleural effusion appears reduced following thoracentesis. Small to moderate residual. Stable cardiomegaly and mediastinal contours. Stable right chest dual lumen dialysis type catheter. Ongoing diffuse pulmonary interstitial opacity not significantly changed from earlier this month and probably interstitial edema. No areas of worsening ventilation. No acute osseous abnormality identified. Negative visible bowel gas. IMPRESSION: 1. Decreased right pleural effusion and no pneumothorax following right side thoracentesis. 2. Stable cardiomegaly and diffuse interstitial opacity favored due to pulmonary edema. Electronically Signed   By: Genevie Ann M.D.   On: 12/15/2021 10:58   ? ?DG Chest 1 View ? ?Result Date: 12/05/2021 ?CLINICAL DATA:  Status post thoracentesis EXAM: CHEST  1 VIEW COMPARISON:  Previous studies including the examination of 12/04/2021 FINDINGS: There is interval decrea

## 2021-12-20 NOTE — Progress Notes (Signed)
[facility] ?Progress Note ? ?Subjective: seen in HD. Tolerated only 1 L off, then felt bad and signed off HD.  ? ?Vitals:  ? 12/20/21 0930 12/20/21 0958 12/20/21 1001 12/20/21 1033  ?BP: (!) 106/56 (!) 132/59 (!) 148/63   ?Pulse: 66 79 74   ?Resp:   11   ?Temp:   98.1 ?F (36.7 ?C)   ?TempSrc:   Oral   ?SpO2:   97%   ?Weight:    43.6 kg  ?Height:      ? ? ?Exam: ?Gen alert, no distress, frail adult WM, nasal O2 ?No jvd or bruits ?Chest clear L,  R base dec'd BS ?RRR no RG ?Abd soft ntnd no mass or ascites +bs ?Ext no LE edema ?Neuro is alert, Ox 3 , nf ?   RIJ TDC/ maturing RUA AVF ?  ?  ? Home meds include - eliquis 2.5 bid, sensipar 60 hs, cardizem cd 300 qd, ensure, breo ellipta, renavit, sl ntg, oxy IR, zoloft, renvela 1 ac tid, prns/ vits/ supps ?  ? CXR 4/15 > worsening pulmonary edema and enlarging right pleural effusion. ?  CXR 4/16 > lowered effusion and improved pulm edema (after HD and thora) ?  ? OP HD: SW MWF ? 3.5h  45.5kg  400/800  2/2 bath P2  RIJ TDC/ maturing RUA AVF   Hep none ? - ave time on machine 1.5 hrs x last 3 ? - mircera 225 q2, last 4/14, due 4/28 ? - no vdra ?  ?  ?Assessment/ Plan: ?Vol overload /SOB/ acute on chronic hypoxic resp failure - worsening pulm edema and R effusion by admit CXR. Has had HD x 3 here w/ mean UF 1.8 L x 3. SP R thoracentesis. [place] down to 4L. Fu CXR 4/16 showed improved edema / effusion. Remains under dry wt today (2kg) after 1 L UF w/ HD this am. Doubt that we can get his volume down much further.  ?ESRD - on HD MWF. HD today.  ?HTN  - BP's normal on long acting diltiazem ?Atrial fib - on eliquis and cardizem ?MBD ckd - cont sensipar and renvela as binder. CCa in range and phos slightly up. Not on vdra.  ?Anemia ckd - Hb 8s here. Just got esa, next due 4/28. Will check fe tibc.  ?Recurrent R pleural effusion - per pmd ?HFpEF - normal EF last echo.  ?Dispo - stable for d/c from renal standpoint ? ? ?[name], hospital ?12/15/2021, 9:08 AM ? ? ?Recent Labs   ?Lab 12/16/21 ?1136 12/16/21 ?1137 12/18/21 ?0807 12/18/21 ?0808 12/20/21 ?0600  ?HGB  --    < > 8.7*  --  9.1*  ?ALBUMIN 2.5*  --   --  2.5*  --   ?CALCIUM 7.9*  --   --  8.7* 8.7*  ?PHOS 6.7*  --   --  6.0*  --   ?CREATININE 6.55*  --   --  5.62* 6.03*  ?K 5.2*  --   --  4.7 4.8  ? < > = values in this interval not displayed.  ? ? ?Inpatient medications: ? apixaban  2.5 mg Oral BID  ? Chlorhexidine Gluconate Cloth  6 each Topical Q0600  ? cinacalcet  60 mg Oral QPM  ? diltiazem  300 mg Oral q morning  ? feeding supplement  237 mL Oral BID BM  ? feeding supplement (NEPRO CARB STEADY)  237 mL Oral BID BM  ? fluticasone furoate-vilanterol  1 puff Inhalation Daily  ?  multivitamin  1 tablet Oral QHS  ? sertraline  25 mg Oral Daily  ? sevelamer carbonate  800 mg Oral TID with meals  ? sodium chloride flush  3 mL Intravenous Q12H  ? ? ?acetaminophen **OR** acetaminophen, albuterol, dextromethorphan-guaiFENesin, heparin sodium (porcine), labetalol, ondansetron (ZOFRAN) IV, oxyCODONE, senna-docusate ? ? ? ? ? ? ?

## 2021-12-20 NOTE — TOC Transition Note (Signed)
Transition of Care (TOC) - CM/SW Discharge Note ? ? ?Patient Details  ?Name: Thomas Robinson ?MRN: [id number] ?Date of Birth: March 05, 1961 ? ?Transition of Care (TOC) CM/SW Contact:  ?Zenon Mayo, RN ?Phone Number: ?12/20/2021, 11:30 AM ? ? ?Clinical Narrative:    ?Patient is for dc today, he is active with [facility] for [facility], NCM notified Amy with [facility] of  dc today.  Patient states he will call his ride to come to transport him home.   ? ? ?Final next level of care: [facility] ?Barriers to Discharge: Continued Medical Work up ? ? ?Patient Goals and CMS Choice ?Patient states their goals for this hospitalization and ongoing recovery are:: return home ?CMS Medicare.gov Compare Post Acute Care list provided to:: Patient ?Choice offered to / list presented to : Patient ? ?Discharge Placement ?  ?           ?  ?  ?  ?  ? ?Discharge Plan and Services ?In-house Referral: NA ?Discharge Planning Services: CM Consult ?Post Acute Care Choice: Resumption of Svcs/PTA Provider          ?  ?DME Agency: NA ?  ?  ?  ?HH Arranged: RN ?[place] Agency: [place] ?Date HH Agency Contacted: 12/16/21 ?Time [place]: 5366 ?Representative spoke with at [place]: [place] ? ?Social Determinants of Health (SDOH) Interventions ?  ? ? ?Readmission Risk Interventions ? ?  12/16/2021  ?  4:58 PM 11/20/2021  ? 11:09 AM 09/08/2021  ?  2:46 PM  ?Readmission Risk Prevention Plan  ?Transportation Screening Complete Complete Complete  ?Medication Review Press photographer) Complete Complete Complete  ?PCP or Specialist appointment within 3-5 days of discharge Complete Complete   ?[place] or Home Care Consult Complete Complete Complete  ?SW Recovery Care/Counseling Consult Complete Complete Complete  ?Palliative Care Screening Not Applicable Complete Not Applicable  ?[place] Not Applicable Not Applicable Not Applicable  ? ? ? ? ? ?

## 2021-12-20 NOTE — Progress Notes (Signed)
Patient discharged: Home with family.  Left via: Wheelchair  Discharge paperwork reviewed and given: to patient and family. Teach back completed. IV and telemetry disconnected. Belongings given to patient.  

## 2021-12-20 NOTE — Progress Notes (Signed)
Navigator arrived at hospital this morning and pt already receiving HD. Contacted inpt HD unit yesterday afternoon with request that pt be placed on 2nd shift this morning in the event pt stable for d/c this am to receive HD as out-pt. D/C order noted. Contacted [facility] and spoke to [place], Therapist, sports. Clinic advised pt will d/c today and should resume care on Monday.  ? ?Melven Sartorius ?Renal Navigator ?[phone] ?

## 2021-12-20 NOTE — Progress Notes (Signed)
?  12/20/21 1001  ?Vitals  ?Temp 98.1 ?F (36.7 ?C)  ?Temp Source Oral  ?BP (!) 148/63  ?BP Location Left Leg  ?BP Method Automatic  ?Patient Position (if appropriate) Lying  ?Pulse Rate 74  ?Pulse Rate Source Monitor  ?Resp 11  ?Oxygen Therapy  ?SpO2 97 %  ?O2 Device Nasal Cannula  ?O2 Flow Rate (L/min) 4 L/min  ?Post-Hemodialysis Assessment  ?Rinseback Volume (mL) 250 mL  ?KECN 283 V  ?Dialyzer Clearance Lightly streaked  ?Duration of HD Treatment -hour(s) 1.75 hour(s)  ?Hemodialysis Intake (mL) 500 mL  ?UF Total -Machine (mL) 1544 mL  ?Net UF (mL) 1044 mL  ?Tolerated HD Treatment No (Comment) ?(requested early termination, reports not feeling well)  ?Post-Hemodialysis Comments tx complete, pt signed AMA  form, Dr. Jonnie Finner notified  ?Hemodialysis Catheter Right Internal jugular Double lumen Permanent (Tunneled)  ?Placement Date/Time: 07/17/21 1836   Time Out: Correct patient;Correct site;Correct procedure  Maximum sterile barrier precautions: Hand hygiene;Cap;Mask;Sterile gown;Large sterile sheet;Sterile gloves  Site Prep: Chlorhexidine (preferred);Skin Prep C...  ?Site Condition No complications  ?Blue Lumen Status Flushed;Dead end cap in place;Heparin locked  ?Red Lumen Status Flushed;Dead end cap in place;Heparin locked  ?Purple Lumen Status N/A  ?Catheter fill solution Heparin 1000 units/ml  ?Catheter fill volume (Arterial) 1.9 cc  ?Catheter fill volume (Venous) 1.9  ?Dressing Type Transparent  ?Dressing Status Antimicrobial disc in place  ?Interventions New dressing;Dressing changed;Antimicrobial disc changed  ?Drainage Description None  ?Dressing Change Due 12/27/21  ?Post treatment catheter status Capped and Clamped  ? ?HD tx complete, UF goal not met due to pt request to terminate with 1hr and 49 minutes left. Despite reeducation on importance of dialysis and compliance, pt adamant that tx be terminated. VSS, no adverse events noted during tx. Dr. Jonnie Finner notified.  ?

## 2022-01-09 ENCOUNTER — Inpatient Hospital Stay (HOSPITAL_COMMUNITY)
Admission: EM | Admit: 2022-01-09 | Discharge: 2022-01-14 | DRG: 291 | Disposition: A | Discharge status: Home Health | Payer: Medicare Other | Attending: Internal Medicine | Admitting: Internal Medicine

## 2022-01-09 ENCOUNTER — Other Ambulatory Visit: Payer: Self-pay

## 2022-01-09 ENCOUNTER — Encounter (HOSPITAL_COMMUNITY): Payer: Self-pay | Admitting: Emergency Medicine

## 2022-01-09 ENCOUNTER — Emergency Department (HOSPITAL_COMMUNITY): Payer: Medicare Other

## 2022-01-09 DIAGNOSIS — I5082 Biventricular heart failure: Secondary | ICD-10-CM | POA: Diagnosis present

## 2022-01-09 DIAGNOSIS — G2581 Restless legs syndrome: Secondary | ICD-10-CM | POA: Diagnosis present

## 2022-01-09 DIAGNOSIS — N186 End stage renal disease: Secondary | ICD-10-CM | POA: Diagnosis present

## 2022-01-09 DIAGNOSIS — I2721 Secondary pulmonary arterial hypertension: Secondary | ICD-10-CM | POA: Diagnosis present

## 2022-01-09 DIAGNOSIS — J449 Chronic obstructive pulmonary disease, unspecified: Secondary | ICD-10-CM | POA: Diagnosis present

## 2022-01-09 DIAGNOSIS — N189 Chronic kidney disease, unspecified: Secondary | ICD-10-CM

## 2022-01-09 DIAGNOSIS — D631 Anemia in chronic kidney disease: Secondary | ICD-10-CM | POA: Diagnosis present

## 2022-01-09 DIAGNOSIS — E875 Hyperkalemia: Secondary | ICD-10-CM | POA: Diagnosis present

## 2022-01-09 DIAGNOSIS — E1122 Type 2 diabetes mellitus with diabetic chronic kidney disease: Secondary | ICD-10-CM | POA: Diagnosis present

## 2022-01-09 DIAGNOSIS — I480 Paroxysmal atrial fibrillation: Secondary | ICD-10-CM | POA: Diagnosis present

## 2022-01-09 DIAGNOSIS — Z7901 Long term (current) use of anticoagulants: Secondary | ICD-10-CM

## 2022-01-09 DIAGNOSIS — Z862 Personal history of diseases of the blood and blood-forming organs and certain disorders involving the immune mechanism: Secondary | ICD-10-CM

## 2022-01-09 DIAGNOSIS — I248 Other forms of acute ischemic heart disease: Secondary | ICD-10-CM | POA: Diagnosis present

## 2022-01-09 DIAGNOSIS — N2581 Secondary hyperparathyroidism of renal origin: Secondary | ICD-10-CM | POA: Diagnosis present

## 2022-01-09 DIAGNOSIS — L89150 Pressure ulcer of sacral region, unstageable: Secondary | ICD-10-CM | POA: Diagnosis present

## 2022-01-09 DIAGNOSIS — L8990 Pressure ulcer of unspecified site, unspecified stage: Secondary | ICD-10-CM

## 2022-01-09 DIAGNOSIS — K219 Gastro-esophageal reflux disease without esophagitis: Secondary | ICD-10-CM | POA: Diagnosis present

## 2022-01-09 DIAGNOSIS — Z681 Body mass index (BMI) 19 or less, adult: Secondary | ICD-10-CM | POA: Diagnosis not present

## 2022-01-09 DIAGNOSIS — I5033 Acute on chronic diastolic (congestive) heart failure: Secondary | ICD-10-CM | POA: Diagnosis present

## 2022-01-09 DIAGNOSIS — I2510 Atherosclerotic heart disease of native coronary artery without angina pectoris: Secondary | ICD-10-CM | POA: Diagnosis present

## 2022-01-09 DIAGNOSIS — M898X9 Other specified disorders of bone, unspecified site: Secondary | ICD-10-CM | POA: Diagnosis present

## 2022-01-09 DIAGNOSIS — I7121 Aneurysm of the ascending aorta, without rupture: Secondary | ICD-10-CM | POA: Diagnosis present

## 2022-01-09 DIAGNOSIS — Z981 Arthrodesis status: Secondary | ICD-10-CM

## 2022-01-09 DIAGNOSIS — I10 Essential (primary) hypertension: Secondary | ICD-10-CM | POA: Diagnosis present

## 2022-01-09 DIAGNOSIS — Z992 Dependence on renal dialysis: Secondary | ICD-10-CM

## 2022-01-09 DIAGNOSIS — J9601 Acute respiratory failure with hypoxia: Principal | ICD-10-CM

## 2022-01-09 DIAGNOSIS — E43 Unspecified severe protein-calorie malnutrition: Secondary | ICD-10-CM | POA: Diagnosis present

## 2022-01-09 DIAGNOSIS — Z91158 Patient's noncompliance with renal dialysis for other reason: Secondary | ICD-10-CM

## 2022-01-09 DIAGNOSIS — J9621 Acute and chronic respiratory failure with hypoxia: Secondary | ICD-10-CM | POA: Diagnosis present

## 2022-01-09 DIAGNOSIS — I132 Hypertensive heart and chronic kidney disease with heart failure and with stage 5 chronic kidney disease, or end stage renal disease: Principal | ICD-10-CM | POA: Diagnosis present

## 2022-01-09 DIAGNOSIS — E785 Hyperlipidemia, unspecified: Secondary | ICD-10-CM | POA: Diagnosis present

## 2022-01-09 DIAGNOSIS — Z20822 Contact with and (suspected) exposure to covid-19: Secondary | ICD-10-CM | POA: Diagnosis present

## 2022-01-09 DIAGNOSIS — J810 Acute pulmonary edema: Secondary | ICD-10-CM

## 2022-01-09 DIAGNOSIS — Z8673 Personal history of transient ischemic attack (TIA), and cerebral infarction without residual deficits: Secondary | ICD-10-CM

## 2022-01-09 DIAGNOSIS — I342 Nonrheumatic mitral (valve) stenosis: Secondary | ICD-10-CM | POA: Diagnosis not present

## 2022-01-09 DIAGNOSIS — R778 Other specified abnormalities of plasma proteins: Secondary | ICD-10-CM | POA: Diagnosis present

## 2022-01-09 DIAGNOSIS — Z8674 Personal history of sudden cardiac arrest: Secondary | ICD-10-CM

## 2022-01-09 DIAGNOSIS — Z881 Allergy status to other antibiotic agents status: Secondary | ICD-10-CM

## 2022-01-09 DIAGNOSIS — Z515 Encounter for palliative care: Secondary | ICD-10-CM

## 2022-01-09 DIAGNOSIS — J90 Pleural effusion, not elsewhere classified: Secondary | ICD-10-CM

## 2022-01-09 DIAGNOSIS — M5416 Radiculopathy, lumbar region: Secondary | ICD-10-CM | POA: Diagnosis present

## 2022-01-09 DIAGNOSIS — I351 Nonrheumatic aortic (valve) insufficiency: Secondary | ICD-10-CM | POA: Diagnosis not present

## 2022-01-09 DIAGNOSIS — R079 Chest pain, unspecified: Secondary | ICD-10-CM | POA: Diagnosis present

## 2022-01-09 LAB — CBC WITH DIFFERENTIAL/PLATELET
Abs Immature Granulocytes: 0.02 10*3/uL (ref 0.00–0.07)
Basophils Absolute: 0.1 10*3/uL (ref 0.0–0.1)
Basophils Relative: 1 %
Eosinophils Absolute: 0.3 10*3/uL (ref 0.0–0.5)
Eosinophils Relative: 4 %
HCT: 39.5 % (ref 39.0–52.0)
Hemoglobin: 11.9 g/dL — ABNORMAL LOW (ref 13.0–17.0)
Immature Granulocytes: 0 %
Lymphocytes Relative: 8 %
Lymphs Abs: 0.5 10*3/uL — ABNORMAL LOW (ref 0.7–4.0)
MCH: 29.8 pg (ref 26.0–34.0)
MCHC: 30.1 g/dL (ref 30.0–36.0)
MCV: 99 fL (ref 80.0–100.0)
Monocytes Absolute: 0.3 10*3/uL (ref 0.1–1.0)
Monocytes Relative: 5 %
Neutro Abs: 5.1 10*3/uL (ref 1.7–7.7)
Neutrophils Relative %: 82 %
Platelets: 154 10*3/uL (ref 150–400)
RBC: 3.99 MIL/uL — ABNORMAL LOW (ref 4.22–5.81)
RDW: 16.4 % — ABNORMAL HIGH (ref 11.5–15.5)
WBC: 6.3 10*3/uL (ref 4.0–10.5)
nRBC: 0 % (ref 0.0–0.2)

## 2022-01-09 LAB — I-STAT VENOUS BLOOD GAS, ED
Acid-Base Excess: 1 mmol/L (ref 0.0–2.0)
Bicarbonate: 25.4 mmol/L (ref 20.0–28.0)
Calcium, Ion: 1.19 mmol/L (ref 1.15–1.40)
HCT: 39 % (ref 39.0–52.0)
Hemoglobin: 13.3 g/dL (ref 13.0–17.0)
O2 Saturation: 93 %
Potassium: 5.9 mmol/L — ABNORMAL HIGH (ref 3.5–5.1)
Sodium: 136 mmol/L (ref 135–145)
TCO2: 27 mmol/L (ref 22–32)
pCO2, Ven: 38.5 mmHg — ABNORMAL LOW (ref 44–60)
pH, Ven: 7.427 (ref 7.25–7.43)
pO2, Ven: 65 mmHg — ABNORMAL HIGH (ref 32–45)

## 2022-01-09 LAB — COMPREHENSIVE METABOLIC PANEL
ALT: 12 U/L (ref 0–44)
AST: 18 U/L (ref 15–41)
Albumin: 3.2 g/dL — ABNORMAL LOW (ref 3.5–5.0)
Alkaline Phosphatase: 94 U/L (ref 38–126)
Anion gap: 17 — ABNORMAL HIGH (ref 5–15)
BUN: 53 mg/dL — ABNORMAL HIGH (ref 8–23)
CO2: 23 mmol/L (ref 22–32)
Calcium: 10.4 mg/dL — ABNORMAL HIGH (ref 8.9–10.3)
Chloride: 98 mmol/L (ref 98–111)
Creatinine, Ser: 7.99 mg/dL — ABNORMAL HIGH (ref 0.61–1.24)
GFR, Estimated: 7 mL/min — ABNORMAL LOW (ref >60)
Glucose, Bld: 115 mg/dL — ABNORMAL HIGH (ref 70–99)
Potassium: 6.2 mmol/L — ABNORMAL HIGH (ref 3.5–5.1)
Sodium: 138 mmol/L (ref 135–145)
Total Bilirubin: 1.5 mg/dL — ABNORMAL HIGH (ref 0.3–1.2)
Total Protein: 7.4 g/dL (ref 6.5–8.1)

## 2022-01-09 LAB — RESP PANEL BY RT-PCR (FLU A&B, COVID) ARPGX2
Influenza A by PCR: NEGATIVE
Influenza B by PCR: NEGATIVE
SARS Coronavirus 2 by RT PCR: NEGATIVE

## 2022-01-09 LAB — TROPONIN I (HIGH SENSITIVITY)
Troponin I (High Sensitivity): 198 ng/L (ref <18)
Troponin I (High Sensitivity): 189 ng/L (ref <18)

## 2022-01-09 LAB — BRAIN NATRIURETIC PEPTIDE: B Natriuretic Peptide: 4241.2 pg/mL — ABNORMAL HIGH (ref 0.0–100.0)

## 2022-01-09 MED ORDER — RENA-VITE PO TABS
1.0000 | ORAL_TABLET | Freq: Every morning | ORAL | Status: DC
  Administered 2022-01-11 – 2022-01-14 (×4): 1 via ORAL
  Filled 2022-01-09 (×5): qty 1

## 2022-01-09 MED ORDER — CALCIUM GLUCONATE-NACL 1-0.675 GM/50ML-% IV SOLN
1.0000 g | Freq: Once | INTRAVENOUS | Status: AC
Start: 2022-01-09 — End: 2022-01-09
  Administered 2022-01-09: 1000 mg via INTRAVENOUS
  Filled 2022-01-09: qty 50

## 2022-01-09 MED ORDER — MIDODRINE HCL 5 MG PO TABS: 10.0000 mg | ORAL_TABLET | Freq: Once | ORAL | Status: DC

## 2022-01-09 MED ORDER — SODIUM CHLORIDE 0.9 % IV SOLN: 250.0000 mL | INTRAVENOUS | Status: DC | PRN

## 2022-01-09 MED ORDER — SEVELAMER CARBONATE 800 MG PO TABS
800.0000 mg | ORAL_TABLET | Freq: Three times a day (TID) | ORAL | Status: DC
  Administered 2022-01-10 – 2022-01-13 (×9): 800 mg via ORAL
  Filled 2022-01-09 (×11): qty 1

## 2022-01-09 MED ORDER — ACETAMINOPHEN 650 MG RE SUPP: 650.0000 mg | Freq: Four times a day (QID) | RECTAL | Status: DC | PRN

## 2022-01-09 MED ORDER — METOPROLOL TARTRATE 25 MG PO TABS: 100.0000 mg | ORAL_TABLET | Freq: Every day | ORAL | Status: DC

## 2022-01-09 MED ORDER — MORPHINE SULFATE (PF) 2 MG/ML IV SOLN
1.0000 mg | INTRAVENOUS | Status: DC | PRN
  Administered 2022-01-09 – 2022-01-11 (×8): 1 mg via INTRAVENOUS
  Filled 2022-01-09 (×8): qty 1

## 2022-01-09 MED ORDER — MORPHINE SULFATE (PF) 2 MG/ML IV SOLN
2.0000 mg | Freq: Once | INTRAVENOUS | Status: AC
  Administered 2022-01-09: 2 mg via INTRAVENOUS
  Filled 2022-01-09: qty 1

## 2022-01-09 MED ORDER — SODIUM CHLORIDE 0.9% FLUSH: 3.0000 mL | INTRAVENOUS | Status: DC | PRN

## 2022-01-09 MED ORDER — SODIUM CHLORIDE 0.9% FLUSH
3.0000 mL | Freq: Two times a day (BID) | INTRAVENOUS | Status: DC
  Administered 2022-01-09: 3 mL via INTRAVENOUS

## 2022-01-09 MED ORDER — SODIUM ZIRCONIUM CYCLOSILICATE 5 G PO PACK
5.0000 g | PACK | Freq: Once | ORAL | Status: AC
  Administered 2022-01-09: 5 g via ORAL
  Filled 2022-01-09: qty 1

## 2022-01-09 MED ORDER — ENSURE ENLIVE PO LIQD
237.0000 mL | Freq: Every day | ORAL | Status: DC
  Filled 2022-01-09: qty 237

## 2022-01-09 MED ORDER — ALBUTEROL SULFATE HFA 108 (90 BASE) MCG/ACT IN AERS: 2.0000 | INHALATION_SPRAY | Freq: Two times a day (BID) | RESPIRATORY_TRACT | Status: DC | PRN

## 2022-01-09 MED ORDER — OXYCODONE HCL 5 MG PO TABS
5.0000 mg | ORAL_TABLET | Freq: Four times a day (QID) | ORAL | Status: DC | PRN
  Administered 2022-01-09 – 2022-01-13 (×10): 5 mg via ORAL
  Filled 2022-01-09 (×10): qty 1

## 2022-01-09 MED ORDER — APIXABAN 2.5 MG PO TABS
2.5000 mg | ORAL_TABLET | Freq: Two times a day (BID) | ORAL | Status: DC
  Administered 2022-01-09 – 2022-01-14 (×10): 2.5 mg via ORAL
  Filled 2022-01-09 (×10): qty 1

## 2022-01-09 MED ORDER — CINACALCET HCL 30 MG PO TABS
60.0000 mg | ORAL_TABLET | Freq: Every day | ORAL | Status: DC
  Administered 2022-01-09 – 2022-01-13 (×5): 60 mg via ORAL
  Filled 2022-01-09 (×5): qty 2

## 2022-01-09 MED ORDER — CHLORHEXIDINE GLUCONATE CLOTH 2 % EX PADS: 6.0000 | MEDICATED_PAD | Freq: Every day | CUTANEOUS | Status: DC

## 2022-01-09 MED ORDER — ACETAMINOPHEN 325 MG PO TABS
650.0000 mg | ORAL_TABLET | Freq: Four times a day (QID) | ORAL | Status: DC | PRN
Start: 2022-01-09 — End: 2022-01-14

## 2022-01-09 MED ORDER — FLUTICASONE FUROATE-VILANTEROL 100-25 MCG/ACT IN AEPB
1.0000 | INHALATION_SPRAY | Freq: Every day | RESPIRATORY_TRACT | Status: DC
  Administered 2022-01-10 – 2022-01-14 (×5): 1 via RESPIRATORY_TRACT
  Filled 2022-01-09: qty 28

## 2022-01-09 MED ORDER — DILTIAZEM HCL ER COATED BEADS 180 MG PO CP24
300.0000 mg | ORAL_CAPSULE | Freq: Every morning | ORAL | Status: DC
  Administered 2022-01-10 – 2022-01-14 (×4): 300 mg via ORAL
  Filled 2022-01-09 (×4): qty 1

## 2022-01-09 MED ORDER — ALBUTEROL SULFATE (2.5 MG/3ML) 0.083% IN NEBU
5.0000 mg | INHALATION_SOLUTION | Freq: Two times a day (BID) | RESPIRATORY_TRACT | Status: DC | PRN
  Administered 2022-01-10 – 2022-01-13 (×2): 5 mg via RESPIRATORY_TRACT
  Filled 2022-01-09 (×2): qty 6

## 2022-01-09 MED ORDER — NITROGLYCERIN 0.4 MG SL SUBL
0.4000 mg | SUBLINGUAL_TABLET | SUBLINGUAL | Status: DC | PRN
  Administered 2022-01-10 – 2022-01-14 (×9): 0.4 mg via SUBLINGUAL
  Filled 2022-01-09 (×10): qty 1

## 2022-01-09 NOTE — Consult Note (Signed)
? ?NAME:  Thomas Robinson, MRN:  [id number], DOB:  Jun 14, 1961, LOS: 0 ?ADMISSION DATE:  01/09/2022, CONSULTATION DATE:  01/09/2022 ?REFERRING MD:  Dr. Dina Rich, CHIEF COMPLAINT:  CP/ SOB  ? ?History of Present Illness:  ? ?61 year old male with PMH of never smoker, asthma, chronic hypoxic respiratory failure on 2-3L HOT, PAH, HFpEF, ESRD on MWF iHD, HTN, CAD, PAF on Eliquis, CVA, and anemia of chronic disease presenting from home with ongoing chest pain and shortness of breath.   ? ?Patient lives at home with a relative and is retired.  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  Prior seen by [facility] pulmonary for asthma.  Spirometry in 06/2020 with severe fixed obstruction with FEV1 41% with good bronchodilator response, maintained on home albuterol and breo.  Patient reports he goes to dialysis but never stays on full treatment due to chest pains.  His last full treatment was during his last hospitalization in April in which he was admitted for acute on chronic hypoxic respiratory failure in the setting of volume overload, recurrent right pleural effusion s/p thoracentesis 12/15/21, and ESRD.  Yesterday he was on iHD for 1.5 hours prior to stopping due to chest pain.  Since its been progressive with worsening exertional shortness of breath.  Denies fevers.  Reports dry cough.   ? ?In ER, he has been afebrile, hypertensive and requiring now 8L of salter HFNC to keep saturations greater than 90%.  Labs significant for K 6.2 without EKG changes, BNP 4241, trop 198, EKG non acute, and CXR showing increased moderately large right pleural effusion from prior with stable pulmonary vascular congestion and cardiomegaly.  His respiratory status has been tenuous at times and refused BiPAP in ER.  Hyperkalemia treated with calcium gluconate and lokelmia with plans for iHD today per Nephrology.  PCCM asked to evaluate for possible ICU admission given O2 needs and respiratory status.  ? ?Pertinent  Medical History  ?Never  smoker, asthma, chronic hypoxic respiratory failure on 2-3L HOT, PAH, HFpEF, ESRD on MWF iHD, HTN, CAD, PAF on Eliquis, CVA, anemia of chronic disease ? ?Significant Hospital Events: ?Including procedures, antibiotic start and stop dates in addition to other pertinent events   ? ? ?Interim History / Subjective:  ? ?Objective   ?Blood pressure (!) 152/120, pulse 88, temperature 97.9 ?F (36.6 ?C), resp. rate (!) 22, height '5\' 8"'$  (1.727 m), weight 45 kg, SpO2 91 %. ?   ?   ?No intake or output data in the 24 hours ending 01/09/22 1605 ?Filed Weights  ? 01/09/22 1104  ?Weight: 45 kg  ? ? ?Examination: ?General:  Thin older adult male sitting upright in ER stretcher in NAD  ?HEENT: MM pink/moist ?Neuro: Aox3, MAE ?CV: rr, NSR, R TDC right chest site wnl, maturing RUE AVF (ready next week per pt), current pain in chest is reproducible  ?PULM:  non labored, mildly tachypneic but in no distress speaking full sentences, slight rales in left base, and diminished in right base, otherwise clear, no wheezes ?GI: soft, bs+, ND ?Extremities: warm/dry, no LE edema  ?Skin: no rashes, posterior not examined.  ? ? ?Resolved Hospital Problem list   ? ?Assessment & Plan:  ? ?Acute on chronic hypoxic respiratory failure  ?Recurrent right pleural effusion, transudate on previous pleural studies  ?PAH ?Acute on chronic HFpEF ?Chest pain  ?HTN  ?ESRD on iHD MWF ?Hyperkalemia  ?Asthma - no acute exacerbation ?Afib on Eliquis, currently NSR ?Plan:  ?- at this  time, he is stable to be admitted [place] ?- symptoms secondary to fluid overload secondary to chronic non compliance with staying for full iHD treatments and acute on chronic HFpEF.   ?- continue supplemental O2, currently on salter HFNC at 8L, for goal sats 88-94% ?- his chest pain is reproducible but continue cardiac rule out and consider cardiology consultation to rule out further ischemic disease.   ?- no plans for thoracentesis, fluid removal per iHD  ?- iHD per Nephrology, plans for  iHD today ?- s/p lokelmia  ?- no acute exacerbation of his asthma suspected, continue home albuterol and breo  ? ? ?Patient wants to continue to be a full code.  Wants CPR and intubation if needed, but not long term.  He reports his NOK is [place], in whom he lives with.  ? ?Nothing further to add.  PCCM will sign off.  Please call us back if we can be of any further assistance. ? ?Labs   ?CBC: ?Recent Labs  ?Lab 01/09/22 ?1316 01/09/22 ?1339  ?WBC 6.3  --   ?NEUTROABS 5.1  --   ?HGB 11.9* 13.3  ?HCT 39.5 39.0  ?MCV 99.0  --   ?PLT 154  --   ? ? ?Basic Metabolic Panel: ?Recent Labs  ?Lab 01/09/22 ?1316 01/09/22 ?1339  ?NA 138 136  ?K 6.2* 5.9*  ?CL 98  --   ?CO2 23  --   ?GLUCOSE 115*  --   ?BUN 53*  --   ?CREATININE 7.99*  --   ?CALCIUM 10.4*  --   ? ?GFR: ?Estimated Creatinine Clearance: 6.1 mL/min (A) (by C-G formula based on SCr of 7.99 mg/dL (H)). ?Recent Labs  ?Lab 01/09/22 ?1316  ?WBC 6.3  ? ? ?Liver Function Tests: ?Recent Labs  ?Lab 01/09/22 ?1316  ?AST 18  ?ALT 12  ?ALKPHOS 94  ?BILITOT 1.5*  ?PROT 7.4  ?ALBUMIN 3.2*  ? ?No results for input(s): LIPASE, AMYLASE in the last 168 hours. ?No results for input(s): AMMONIA in the last 168 hours. ? ?ABG ?   ?Component Value Date/Time  ? PHART 7.410 07/20/2021 1518  ? PCO2ART 37.6 07/20/2021 1518  ? PO2ART 100 07/20/2021 1518  ? HCO3 25.4 01/09/2022 1339  ? TCO2 27 01/09/2022 1339  ? ACIDBASEDEF 1.0 07/20/2021 1518  ? O2SAT 93 01/09/2022 1339  ?  ? ?Coagulation Profile: ?No results for input(s): INR, PROTIME in the last 168 hours. ? ?Cardiac Enzymes: ?No results for input(s): CKTOTAL, CKMB, CKMBINDEX, TROPONINI in the last 168 hours. ? ?HbA1C: ?Hgb A1c MFr Bld  ?Date/Time Value Ref Range Status  ?07/01/2021 01:31 PM 4.6 (L) 4.8 - 5.6 % Final  ?  Comment:  ?  (NOTE) ?Pre diabetes:          5.7%-6.4% ? ?Diabetes:              >6.4% ? ?Glycemic control for   <7.0% ?adults with diabetes ?  ?01/04/2020 05:49 PM 4.9 4.8 - 5.6 % Final  ?  Comment:  ?  (NOTE) ?Pre  diabetes:          5.7%-6.4% ?Diabetes:              >6.4% ?Glycemic control for   <7.0% ?adults with diabetes ?  ? ? ?CBG: ?No results for input(s): GLUCAP in the last 168 hours. ? ?Review of Systems:   ?Review of Systems  ?Constitutional:  Positive for malaise/fatigue. Negative for chills and fever.  ?Respiratory:  Positive for cough and shortness  of breath. Negative for sputum production and wheezing.   ?Cardiovascular:  Positive for chest pain. Negative for palpitations and leg swelling.  ?Gastrointestinal:  Negative for abdominal pain, nausea and vomiting.  ?Neurological:  Negative for focal weakness, loss of consciousness and headaches.  ? ?Past Medical History:  ?He,  has a past medical history of Anaphylactic shock, unspecified, sequela (06/10/2019), Aortic insufficiency, Aortic stenosis, Arthritis, Asthma, chronic, unspecified asthma severity, with acute exacerbation (10/23/2017), CAP (community acquired pneumonia) (10/23/2017), Carpal tunnel syndrome of right wrist (10/05/2018), Cataract, Cerebral infarction due to thrombosis of cerebral artery ([place]), Cervical disc herniation (01/04/2020), Chronic diastolic heart failure ([place]) (04/13/2018), Chronic low back pain (11/24/2019), Constipation, COPD (chronic obstructive pulmonary disease) ([place]), Cough, Encounter for immunization (07/08/2017), ESRD on hemodialysis ([place]) (02/16/2018), Fall (06/04/2019), GERD (gastroesophageal reflux disease) (06/04/2019), GI bleeding (05/24/2017), Gram-negative sepsis, unspecified ([place]) (09/05/2017), History of fusion of cervical spine (03/26/2020), Hyperlipidemia, Hypertension, Hypotension, Iron deficiency anemia, unspecified (09/09/2017), Left shoulder pain (11/11/2019), Lumbar radiculopathy (11/24/2019), Lung contusion (06/04/2019), LVH (left ventricular hypertrophy) due to hypertensive disease, with heart failure ([place]) (06/18/2020), Macrocytic anemia (05/24/2017), Mitral regurgitation, Moderate protein-calorie malnutrition  ([place]) (06/06/2017), Myofascial pain syndrome (06/12/2020), Neuritis of right ulnar nerve (09/13/2018), Non-compliance with renal dialysis (02/08/2020), Oxygen deficiency (12/30/2019), Pain in joint of rig

## 2022-01-09 NOTE — ED Triage Notes (Signed)
Pt to ER via EMS from home with c/o chest pain starting yesterday.  Pt also reports dizziness and exertional SHOB.  Pt reports pain is dull and radiated to left arm and neck.  Pt is dialysis pt, had full treatment yesterday. Reports pain started after dialysis.  Pt has had 3 NTG, improved pain "slightly". Pt normally on 2-3 L [place], arrives on 4L.    ?

## 2022-01-09 NOTE — Assessment & Plan Note (Addendum)
-  poor compliance with dialysis and typically leaves session early ?-volume overloaded on arrival to ED today with hyperkalemia-potassium to 6.2 and increased AG ?-nephrology consulted with plans for off schedule HD today.  ?-supposed to be on midodrine on HD days, he is unsure if he is taking this and could not get a hold of his roommate to verify. Will give '10mg'$  midodrine tonight before HD and then can verify with roommate if he is on this TID on dialysis days  ?

## 2022-01-09 NOTE — Assessment & Plan Note (Signed)
Wound care consult, appreciate  ?

## 2022-01-09 NOTE — Assessment & Plan Note (Signed)
Rate controlled, keep on telemetry  ?Continue eliquis and cardizem ?

## 2022-01-09 NOTE — ED Provider Notes (Signed)
?[facility] ?Provider Note ? ? ?CSN: [id number] ?Arrival date & time: 01/09/22  1054 ? ?  ? ?History ? ?Chief Complaint  ?Patient presents with  ? Chest Pain  ? Shortness of Breath  ? Dizziness  ? ? ?Thomas Robinson is a 61 y.o. male. ? ?HPI ? ?61 year old male with past medical history of ESRD on hemodialysis, noncompliant with dialysis, recurrent right pleural effusion on chronic oxygen 3 L nasal cannula along other chronic comorbidities presents emergency department with concern for chest pain and shortness of breath.  Recently was admitted about a month ago with similar symptoms, had the pleural effusion drained by thoracentesis and was discharged home.  Reports that he went to dialysis yesterday.  Here with right-sided chest heaviness and shortness of breath.  Denies any recent fever, cough or GI symptoms. ? ?Home Medications ?Prior to Admission medications   ?Medication Sig Start Date End Date Taking? Authorizing Provider  ?acetaminophen (TYLENOL) 500 MG tablet Take 1,000 mg by mouth every 6 (six) hours as needed for moderate pain (pain).   Yes [provider]  ?albuterol (VENTOLIN HFA) 108 (90 Base) MCG/ACT inhaler Inhale 2 puffs into the lungs 2 (two) times daily as needed for shortness of breath or wheezing. 06/01/21  Yes Oswald Hillock, MD  ?apixaban (ELIQUIS) 2.5 MG TABS tablet Take 1 tablet (2.5 mg total) by mouth 2 (two) times daily. 11/23/21 01/09/22 Yes Arrien, Jimmy Picket, MD  ?B Complex Vitamins (B-COMPLEX/B-12) TABS Take 1 tablet by mouth daily. 11/25/21  Yes [provider]  ?Cholecalciferol (VITAMIN D3 PO) Take 1 capsule by mouth See admin instructions. Twice a week - [place]   Yes [provider]  ?diltiazem (CARDIZEM CD) 300 MG 24 hr capsule Take 1 capsule (300 mg total) by mouth daily. ?Patient taking differently: Take 300 mg by mouth every morning. 09/21/21  Yes Ghimire, Henreitta Leber, MD  ?ethyl chloride spray Apply 1  application. topically 3 (three) times a week. 11/02/21  Yes [provider]  ?feeding supplement (ENSURE ENLIVE / ENSURE PLUS) LIQD Take 237 mLs by mouth 2 (two) times daily between meals. ?Patient taking differently: Take 237 mLs by mouth daily. 09/13/21  Yes Elgergawy, Silver Huguenin, MD  ?fluticasone furoate-vilanterol (BREO ELLIPTA) 100-25 MCG/ACT AEPB Inhale 1 puff into the lungs daily as needed (For shortness of breath).   Yes [provider]  ?metoprolol tartrate (LOPRESSOR) 100 MG tablet Take 100 mg by mouth daily. 12/29/21  Yes [provider]  ?multivitamin (RENA-VIT) TABS tablet Take 1 tablet by mouth every morning. 01/12/18  Yes [provider]  ?nitroGLYCERIN (NITROSTAT) 0.4 MG SL tablet Place 1 tablet (0.4 mg total) under the tongue every 5 (five) minutes as needed for chest pain. 10/01/21  Yes Aline August, MD  ?oxyCODONE (OXY IR/ROXICODONE) 5 MG immediate release tablet Take 1 tablet (5 mg total) by mouth every 6 (six) hours as needed for severe pain. 12/06/21  Yes Arrien, Jimmy Picket, MD  ?sevelamer carbonate (RENVELA) 800 MG tablet Take 800 mg by mouth See admin instructions. Take one tablet (800 mg) by mouth three times daily - morning, evening and bedtime 05/31/21  Yes [provider]  ?cinacalcet (SENSIPAR) 60 MG tablet Take 1 tablet (60 mg total) by mouth every evening. ?Patient not taking: Reported on 01/09/2022 06/01/21   Oswald Hillock, MD  ?midodrine (PROAMATINE) 10 MG tablet Take 10 mg by mouth 3 (three) times daily. ?Patient not taking: Reported on 01/09/2022  [provider]  ?   ? ?Allergies    ?Vancomycin   ? ?Review of Systems   ?Review of Systems  ?Constitutional:  Positive for fatigue. Negative for fever.  ?Respiratory:  Positive for chest tightness and shortness of breath.   ?Cardiovascular:  Negative for chest pain and leg swelling.  ?Gastrointestinal:  Negative for abdominal pain, diarrhea and vomiting.  ?Skin:  Negative for rash.   ?Neurological:  Negative for headaches.  ? ?Physical Exam ?Updated Vital Signs ?BP (!) 149/114   Pulse 80   Temp 97.9 ?F (36.6 ?C)   Resp (!) 22   Ht '5\' 8"'$  (1.727 m)   Wt 45 kg   SpO2 93%   BMI 15.08 kg/m?  ?Physical Exam ?Vitals and nursing note reviewed.  ?Constitutional:   ?   General: He is not in acute distress. ?   Appearance: Normal appearance.  ?HENT:  ?   Head: Normocephalic.  ?   Mouth/Throat:  ?   Mouth: Mucous membranes are moist.  ?Cardiovascular:  ?   Rate and Rhythm: Normal rate.  ?Pulmonary:  ?   Effort: Pulmonary effort is normal. Tachypnea present. No respiratory distress.  ?   Breath sounds: Examination of the right-middle field reveals rales. Examination of the right-lower field reveals rales. Decreased breath sounds and rales present.  ?Abdominal:  ?   Palpations: Abdomen is soft.  ?   Tenderness: There is no abdominal tenderness.  ?Skin: ?   General: Skin is warm.  ?Neurological:  ?   Mental Status: He is alert and oriented to person, place, and time. Mental status is at baseline.  ?Psychiatric:     ?   Mood and Affect: Mood normal.  ? ? ?ED Results / Procedures / Treatments   ?Labs ?(all labs ordered are listed, but only abnormal results are displayed) ?Labs Reviewed  ?CBC WITH DIFFERENTIAL/PLATELET - Abnormal; Notable for the following components:  ?    Result Value  ? RBC 3.99 (*)   ? Hemoglobin 11.9 (*)   ? RDW 16.4 (*)   ? Lymphs Abs 0.5 (*)   ? All other components within normal limits  ?COMPREHENSIVE METABOLIC PANEL - Abnormal; Notable for the following components:  ? Potassium 6.2 (*)   ? Glucose, Bld 115 (*)   ? BUN 53 (*)   ? Creatinine, Ser 7.99 (*)   ? Calcium 10.4 (*)   ? Albumin 3.2 (*)   ? Total Bilirubin 1.5 (*)   ? GFR, Estimated 7 (*)   ? Anion gap 17 (*)   ? All other components within normal limits  ?BRAIN NATRIURETIC PEPTIDE - Abnormal; Notable for the following components:  ? B Natriuretic Peptide 4,241.2 (*)   ? All other components within normal limits  ?I-STAT  VENOUS BLOOD GAS, ED - Abnormal; Notable for the following components:  ? pCO2, Ven 38.5 (*)   ? pO2, Ven 65 (*)   ? Potassium 5.9 (*)   ? All other components within normal limits  ?TROPONIN I (HIGH SENSITIVITY) - Abnormal; Notable for the following components:  ? Troponin I (High Sensitivity) 198 (*)   ? All other components within normal limits  ?RESP PANEL BY RT-PCR (FLU A&B, COVID) [id number]  ?TROPONIN I (HIGH SENSITIVITY)  ? ? ?EKG ?EKG Interpretation ? ?Date/Time:  Thursday Jan 09 2022 12:44:13 EDT ?Ventricular Rate:  87 ?PR Interval:  247 ?QRS Duration: 91 ?QT Interval:  386 ?QTC Calculation: 465 ?R Axis:   255 ?Text Interpretation:  Sinus rhythm Prolonged PR interval Left anterior fascicular block Probable right ventricular hypertrophy Nonspecific T abnrm, anterolateral leads Confirmed by Lavenia Atlas [phone]) on 01/09/2022 2:42:40 PM ? ?Radiology ?DG Chest Port 1 View ? ?Result Date: 01/09/2022 ?CLINICAL DATA:  sob, chest pain starting yesterday after dialysis, dizziness EXAM: PORTABLE CHEST 1 VIEW COMPARISON:  December 15, 2021 FINDINGS: Again seen is the right transjugular tunneled dialysis catheter. Cardiomegaly. Moderately large right pleural effusion and has worsened in the interim. Pulmonary vascular congestion appears overall stable. Bibasilar atelectasis. The visualized skeletal structures are unremarkable. IMPRESSION: Moderately large right pleural effusion and has increased in the interim. Pulmonary vascular congestion appears stable. Cardiomegaly. Electronically Signed   By: Frazier Richards M.D.   On: 01/09/2022 13:17   ? ?Procedures ?Marland KitchenCritical Care ?Performed by: Lorelle Gibbs, DO ?Authorized by: Lorelle Gibbs, DO  ? ?Critical care provider statement:  ?  Critical care time (minutes):  60 ?  Critical care time was exclusive of:  Separately billable procedures and treating other patients ?  Critical care was necessary to treat or prevent imminent or life-threatening deterioration of the following  conditions:  Respiratory failure and metabolic crisis ?  Critical care was time spent personally by me on the following activities:  Development of treatment plan with patient or surrogate, discussions with consultant

## 2022-01-09 NOTE — H&P (Signed)
?History and Physical  ? ? ?Patient: Thomas Robinson AYT:[id number] DOB: 1960/11/03 ?DOA: 01/09/2022 ?DOS: the patient was seen and examined on 01/09/2022 ?PCP: [name], PA  ?Patient coming from: Home - lives with roommate. Uses a walker  ? ? ?Chief Complaint: shortness of breath and chest discomfort  ? ?HPI: Nicolas Banh is a 61 y.o. male with medical history significant of  ESRD on MWF HD, HFpEF (EF 60-65%), chronic respiratory failure with hypoxia on 4 L O2 via [place], paroxysmal atrial fibrillation on Eliquis, recurrent right pleural effusion, COPD, history of CVA, HTN, anemia of chronic kidney disease who presented to ED with complaints of worsening shortness of breath that started after dialysis yesterday in the evening. He states he just started to feel lousy. He didn't sleep good and had some chest pain during the night and this morning. He states he called 911 because of his shortness of breath and his weakness and feeling like he had to fight to breath. He describes the chest pain has intermittent and over his left anterior chest wall. States it is pressure like and is rated as a 9/10. Pain medication helps make it better. Nothing makes it worse. He had some tingling in his left arm that went from chest down arm, but has not had any since that time.  ? ?Recently admitted 4/15-4/21/21 for volume overload, hypoxia, pulmonary edema and increasing right pleural effusion. Had thoracentesis with IR on 4/16 with 1.1L drained, transudative. This was his 5th hospitalization this year for volume overload in setting of dialysis noncompliance. Understands poor prognosis, has met with palliative care, would like aggressive care/full code.  ? ? ? Denies any fever/chills, vision changes/headaches,  abdominal pain, N/V/D, no cough, dysuria or leg swelling.  ? ?-he has lost weight and has +orthopnea  ? ?He does smoke or drink alcohol.  ? ? ?ER Course:  vitals: afebrile, bp: 171/95, HR: 94, RR: 25, oxygen: 86% on 3L  [place] ?Pertinent labs: hgb: 11.9, potassium: 6.2, BUN: 53, creatinine: 7.99, AG: 17, troponin: 198, bnp: 4241, venous ph: 7.427,  ?CXR: moderately large right pleural effusion and has increased in the interim. Pulmonary vascular congestion appears stable.  ?In ED: nephrology consulted/icu consulted. Given calcium gluconate, lokelma, morphine ? ? ?Review of Systems: As mentioned in the history of present illness. All other systems reviewed and are negative. ?Past Medical History:  ?Diagnosis Date  ? Anaphylactic shock, unspecified, sequela 06/10/2019  ? Aortic insufficiency   ? Aortic stenosis   ? Arthritis   ? Asthma, chronic, unspecified asthma severity, with acute exacerbation 10/23/2017  ? CAP (community acquired pneumonia) 10/23/2017  ? Carpal tunnel syndrome of right wrist 10/05/2018  ? Cataract   ? right - removed by surgery  ? Cerebral infarction due to thrombosis of cerebral artery ([place])   ? Cervical disc herniation 01/04/2020  ? Chronic diastolic heart failure ([place]) 04/13/2018  ? Chronic low back pain 11/24/2019  ? Constipation   ? COPD (chronic obstructive pulmonary disease) ([place])   ? Cough   ? chronic cough  ? Encounter for immunization 07/08/2017  ? ESRD on hemodialysis ([place]) 02/16/2018  ? Fall 06/04/2019  ? GERD (gastroesophageal reflux disease) 06/04/2019  ? GI bleeding 05/24/2017  ? Gram-negative sepsis, unspecified ([place]) 09/05/2017  ? History of fusion of cervical spine 03/26/2020  ? Hyperlipidemia   ? Hypertension   ? Hypotension   ? Iron deficiency anemia, unspecified 09/09/2017  ? Left shoulder pain 11/11/2019  ? Lumbar radiculopathy 11/24/2019  ?  Lung contusion 06/04/2019  ? LVH (left ventricular hypertrophy) due to hypertensive disease, with heart failure ([place]) 06/18/2020  ? Macrocytic anemia 05/24/2017  ? Mitral regurgitation   ? Moderate protein-calorie malnutrition ([place]) 06/06/2017  ? Myofascial pain syndrome 06/12/2020  ? Neuritis of right ulnar nerve 09/13/2018  ? Non-compliance with renal  dialysis 02/08/2020  ? Oxygen deficiency 12/30/2019  ? O2 sats on RA 87% at PAT appt   ? Pain in joint of right elbow 09/13/2018  ? Paroxysmal atrial flutter ([place])   ? PEA (Pulseless electrical activity) ([place])   ? after sedation in 07/2021  ? Pulmonary hypertension ([place])   ? Renovascular hypertension 06/22/2020  ? Restless leg syndrome   ? Rib fractures 06/2019  ? Right  ? S/P cardiac cath 08/27/2020  ? normal coronary arteries.  ? Secondary hyperparathyroidism of renal origin ([place]) 06/04/2017  ? Thoracic ascending aortic aneurysm ([place])   ? Tricuspid regurgitation   ? Ulnar neuropathy 10/05/2018  ? ?Past Surgical History:  ?Procedure Laterality Date  ? A/V FISTULAGRAM N/A 01/01/2021  ? Procedure: A/V FISTULAGRAM;  Surgeon: Serafina Mitchell, MD;  Location: [facility];  Service: Cardiovascular;  Laterality: N/A;  ? A/V FISTULAGRAM N/A 01/15/2021  ? Procedure: A/V FISTULAGRAM;  Surgeon: Serafina Mitchell, MD;  Location: [facility];  Service: Cardiovascular;  Laterality: N/A;  ? ANTERIOR CERVICAL DECOMP/DISCECTOMY FUSION N/A 01/04/2020  ? Procedure: ANTERIOR CERVICAL DECOMPRESSION/DISCECTOMY FUSION CERVICAL FIVE THROUGH SEVEN;  Surgeon: Melina Schools, MD;  Location: [facility];  Service: Orthopedics;  Laterality: N/A;  3 hrs  ? AV FISTULA PLACEMENT Left 05/28/2017  ? Procedure: LEFT ARM ARTERIOVENOUS (AV) FISTULA CREATION;  Surgeon: Conrad Congers, MD;  Location: [facility];  Service: Vascular;  Laterality: Left;  ? AV FISTULA PLACEMENT Left 02/02/2021  ? Procedure: Debride left forearm, ligation of left upper arm Aretiovenous fistula;  Surgeon: Elam Dutch, MD;  Location: [facility];  Service: Vascular;  Laterality: Left;  ? AV FISTULA PLACEMENT Right 04/04/2021  ? Procedure: RIGHT ARTERIOVENOUS (AV) FISTULA CREATION;  Surgeon: Serafina Mitchell, MD;  Location: [facility];  Service: Vascular;  Laterality: Right;  ? BIOPSY  07/10/2021  ? Procedure: BIOPSY;  Surgeon: Lavena Bullion, DO;  Location: [facility];  Service:  Gastroenterology;;  ? COLONOSCOPY WITH PROPOFOL N/A 07/10/2021  ? Procedure: COLONOSCOPY WITH PROPOFOL;  Surgeon: Lavena Bullion, DO;  Location: [facility];  Service: Gastroenterology;  Laterality: N/A;  ? ESOPHAGOGASTRODUODENOSCOPY (EGD) WITH PROPOFOL N/A 07/10/2021  ? Procedure: ESOPHAGOGASTRODUODENOSCOPY (EGD) WITH PROPOFOL;  Surgeon: Lavena Bullion, DO;  Location: [facility];  Service: Gastroenterology;  Laterality: N/A;  ? EYE SURGERY Right 06/02/2019  ? Cataract removed  ? FRACTURE SURGERY    ? INSERTION OF DIALYSIS CATHETER Right 02/02/2021  ? Procedure: INSERTION OF Right Internal jugular TUNNELED  DIALYSIS CATHETER;  Surgeon: Elam Dutch, MD;  Location: [facility] OR;  Service: Vascular;  Laterality: Right;  ? IR DIALY SHUNT INTRO NEEDLE/INTRACATH INITIAL W/IMG LEFT Left 09/12/2020  ? IR FLUORO GUIDE CV LINE RIGHT  05/25/2017  ? IR FLUORO GUIDE CV LINE RIGHT  07/17/2021  ? IR THORACENTESIS ASP PLEURAL SPACE W/IMG GUIDE  09/11/2021  ? IR THORACENTESIS ASP PLEURAL SPACE W/IMG GUIDE  12/05/2021  ? IR US GUIDE VASC ACCESS LEFT  09/12/2020  ? IR US GUIDE VASC ACCESS RIGHT  05/25/2017  ? IR US GUIDE VASC ACCESS RIGHT  07/17/2021  ? LIGATION OF ARTERIOVENOUS  FISTULA Left 01/10/2021  ? Procedure:  LIGATION OF LEFT ARM RADIOCEPHALIC FISTULA;  Surgeon: Serafina Mitchell, MD;  Location: [facility];  Service: Vascular;  Laterality: Left;  ? PERIPHERAL VASCULAR BALLOON ANGIOPLASTY Left 01/15/2021  ? Procedure: PERIPHERAL VASCULAR BALLOON ANGIOPLASTY;  Surgeon: Serafina Mitchell, MD;  Location: [facility];  Service: Cardiovascular;  Laterality: Left;  AVF  ? REVISON OF ARTERIOVENOUS FISTULA Left 07/18/2019  ? Procedure: REVISION PLICATION OF RADIOCEPHALIC ARTERIOVENOUS FISTULA LEFT ARM;  Surgeon: Angelia Mould, MD;  Location: [facility] OR;  Service: Vascular;  Laterality: Left;  ? REVISON OF ARTERIOVENOUS FISTULA Left 01/10/2021  ? Procedure: CONVERSION TO BRACHIOCEPHALIC ARTERIOVENOUS FISTULA;  Surgeon: Serafina Mitchell,  MD;  Location: [facility];  Service: Vascular;  Laterality: Left;  ? RINOPLASTY    ? ?Social History:  reports that he has never smoked. He has never used smokeless tobacco. He reports that he does not currently [place]

## 2022-01-09 NOTE — Assessment & Plan Note (Signed)
Large pleural effusion in setting of poor dialysis compliance and multiple thoracentesis.  ?Last thoracentesis on 4/16 with 1.1L, transudate  ?Will likely need this done again, but will see how he does after dialysis per ICU ? ?

## 2022-01-09 NOTE — Assessment & Plan Note (Signed)
No signs of exacerbation on exam today ?Continue with breo daily and SABA prn  ?

## 2022-01-09 NOTE — Assessment & Plan Note (Addendum)
61 year old with history of ESRD on dialysis MWF, HFpEF, recurrent right pleural effusion presenting with worsening shortness of breath found to be hypoxic on home oxygen of 4L at 83-86% requiring 8L  HFNC to maintain oxygen >92%.  ?Secondary to volume overload in setting of acute on chronic HFpEF, recurrent right pleural effusion, ESRD.  Increased O2 requirement from 4 L via [place] to HFNC  to maintain SPO2 >92%. ?-Volume removal with dialysis ?-monitor pleural effusion s/p dialysis as likely needs repeat thoracentesis  ?-ICU consulted, okay for progressive. No thoracentesis today.  ?-Wean supplemental oxygen as able ?

## 2022-01-09 NOTE — Assessment & Plan Note (Signed)
Stable, continue to monitor  ?

## 2022-01-09 NOTE — Assessment & Plan Note (Addendum)
-  Admitted with dyspnea due to volume overload with BNP 4241  and increased O2 requirement from 4 L via [place] as high as 8 L HFNC to maintain SPO2 >92%.  CXR shows increasing right pleural effusion, stable pulmonary vascular congestion  ?-Dialysis planned tonight per nephrology for volume removal ?-Monitor strict I/O's and daily weights ?-echo 12/22: Last echo with preserved EF, severe PAH, grade 1 DD ?-If no significant improvement, consider repeat thoracentesis tomorrow  ?

## 2022-01-09 NOTE — Assessment & Plan Note (Addendum)
Intermittent chest pain with some tingling down left arm that comes and goes. Has been chest pain free in ED until I walked into room and he wanted IV pain medication. Stated his chest pain was back. Stat ekg with no changes and repeat troponin down trending. Seen by cardiology in January admit. Likely demand ischemia in setting of acute on chronic respiratory failure as well as ESRD. Re-examined him and resting, chest pain free.  ?-discussed with on call cardiology, agree with above and if continues to have refractory chest pain through night or after dialysis, will officially consult.  ?Troponin persistently mildly elevated in setting of acute on chronic HFpEF and ESRD.  slightly above baseline today: 198>189 likely due to acute on chronic respiratory failure and more demand ischemia.  ?-normal coronary arteries by cath?@ [facility]?08/2020 ? Doubt ACS at this time. ?

## 2022-01-09 NOTE — ED Notes (Signed)
Patient refusing VS at this time. Patient stating to NT that he would like to leave. Provider made aware. ?

## 2022-01-09 NOTE — Consult Note (Signed)
[facility] ?Renal Consultation Note  ?  ?Indication for Consultation:  Management of ESRD/hemodialysis, anemia, hypertension/volume, and secondary hyperparathyroidism. ? ?HPI: Thomas Robinson is a 61 y.o. male with PMH including ESRD with noncompliance with dialysis, CAD, COPD, R sided pleural effusion, HTN, and PEA after sedation who presented to the ED today with chest pain. Patient reports pain in his left chest radiating to his left shoulder. He also reports a tingling sensation in his bilateral elbows. Reports his chest pain started this AM but also happens every dialysis treatment. He shortens his treatments to 1-1.5 hours, which he reports is because he is worried he will have chest pain. He denies dizziness, headache, abdominal pain, N/V/D, fever, chills. He reports he feels he is dehydrated but tries not to drink a lot. Labs today reveal K+ 5.9, BUN 53, Cr 7.99, Ca 10.4, Alb 3.2, BNP 4241, WBC 6.3, Hgb 11.9, Plt 154. CXR showed volume overload and right sided pleural effusion, larger in size compared to last admission. Vital signs noted for mild tachypnea and hypertension. He was just hospitalized 12/14/21-12/20/21 with similar presentation. Has been seen by palliative care multiple times over the past year and wishes to full scope of care.  ? ?Past Medical History:  ?Diagnosis Date  ? Anaphylactic shock, unspecified, sequela 06/10/2019  ? Aortic insufficiency   ? Aortic stenosis   ? Arthritis   ? Asthma, chronic, unspecified asthma severity, with acute exacerbation 10/23/2017  ? CAP (community acquired pneumonia) 10/23/2017  ? Carpal tunnel syndrome of right wrist 10/05/2018  ? Cataract   ? right - removed by surgery  ? Cerebral infarction due to thrombosis of cerebral artery ([place])   ? Cervical disc herniation 01/04/2020  ? Chronic diastolic heart failure ([place]) 04/13/2018  ? Chronic low back pain 11/24/2019  ? Constipation   ? COPD (chronic obstructive pulmonary disease) ([place])   ? Cough   ?  chronic cough  ? Encounter for immunization 07/08/2017  ? ESRD on hemodialysis ([place]) 02/16/2018  ? Fall 06/04/2019  ? GERD (gastroesophageal reflux disease) 06/04/2019  ? GI bleeding 05/24/2017  ? Gram-negative sepsis, unspecified ([place]) 09/05/2017  ? History of fusion of cervical spine 03/26/2020  ? Hyperlipidemia   ? Hypertension   ? Hypotension   ? Iron deficiency anemia, unspecified 09/09/2017  ? Left shoulder pain 11/11/2019  ? Lumbar radiculopathy 11/24/2019  ? Lung contusion 06/04/2019  ? LVH (left ventricular hypertrophy) due to hypertensive disease, with heart failure ([place]) 06/18/2020  ? Macrocytic anemia 05/24/2017  ? Mitral regurgitation   ? Moderate protein-calorie malnutrition ([place]) 06/06/2017  ? Myofascial pain syndrome 06/12/2020  ? Neuritis of right ulnar nerve 09/13/2018  ? Non-compliance with renal dialysis 02/08/2020  ? Oxygen deficiency 12/30/2019  ? O2 sats on RA 87% at PAT appt   ? Pain in joint of right elbow 09/13/2018  ? Paroxysmal atrial flutter ([place])   ? PEA (Pulseless electrical activity) ([place])   ? after sedation in 07/2021  ? Pulmonary hypertension ([place])   ? Renovascular hypertension 06/22/2020  ? Restless leg syndrome   ? Rib fractures 06/2019  ? Right  ? S/P cardiac cath 08/27/2020  ? normal coronary arteries.  ? Secondary hyperparathyroidism of renal origin ([place]) 06/04/2017  ? Thoracic ascending aortic aneurysm ([place])   ? Tricuspid regurgitation   ? Ulnar neuropathy 10/05/2018  ? ?Past Surgical History:  ?Procedure Laterality Date  ? A/V FISTULAGRAM N/A 01/01/2021  ? Procedure: A/V FISTULAGRAM;  Surgeon: Serafina Mitchell, MD;  Location: [facility];  Service: Cardiovascular;  Laterality: N/A;  ? A/V FISTULAGRAM N/A 01/15/2021  ? Procedure: A/V FISTULAGRAM;  Surgeon: Serafina Mitchell, MD;  Location: [facility];  Service: Cardiovascular;  Laterality: N/A;  ? ANTERIOR CERVICAL DECOMP/DISCECTOMY FUSION N/A 01/04/2020  ? Procedure: ANTERIOR CERVICAL DECOMPRESSION/DISCECTOMY FUSION  CERVICAL FIVE THROUGH SEVEN;  Surgeon: Melina Schools, MD;  Location: [facility];  Service: Orthopedics;  Laterality: N/A;  3 hrs  ? AV FISTULA PLACEMENT Left 05/28/2017  ? Procedure: LEFT ARM ARTERIOVENOUS (AV) FISTULA CREATION;  Surgeon: Conrad [place], MD;  Location: [facility];  Service: Vascular;  Laterality: Left;  ? AV FISTULA PLACEMENT Left 02/02/2021  ? Procedure: Debride left forearm, ligation of left upper arm Aretiovenous fistula;  Surgeon: Elam Dutch, MD;  Location: [facility] OR;  Service: Vascular;  Laterality: Left;  ? AV FISTULA PLACEMENT Right 04/04/2021  ? Procedure: RIGHT ARTERIOVENOUS (AV) FISTULA CREATION;  Surgeon: Serafina Mitchell, MD;  Location: [facility];  Service: Vascular;  Laterality: Right;  ? BIOPSY  07/10/2021  ? Procedure: BIOPSY;  Surgeon: Lavena Bullion, DO;  Location: [facility];  Service: Gastroenterology;;  ? COLONOSCOPY WITH PROPOFOL N/A 07/10/2021  ? Procedure: COLONOSCOPY WITH PROPOFOL;  Surgeon: Lavena Bullion, DO;  Location: [facility];  Service: Gastroenterology;  Laterality: N/A;  ? ESOPHAGOGASTRODUODENOSCOPY (EGD) WITH PROPOFOL N/A 07/10/2021  ? Procedure: ESOPHAGOGASTRODUODENOSCOPY (EGD) WITH PROPOFOL;  Surgeon: Lavena Bullion, DO;  Location: [facility];  Service: Gastroenterology;  Laterality: N/A;  ? EYE SURGERY Right 06/02/2019  ? Cataract removed  ? FRACTURE SURGERY    ? INSERTION OF DIALYSIS CATHETER Right 02/02/2021  ? Procedure: INSERTION OF Right Internal jugular TUNNELED  DIALYSIS CATHETER;  Surgeon: Elam Dutch, MD;  Location: [facility] OR;  Service: Vascular;  Laterality: Right;  ? IR DIALY SHUNT INTRO NEEDLE/INTRACATH INITIAL W/IMG LEFT Left 09/12/2020  ? IR FLUORO GUIDE CV LINE RIGHT  05/25/2017  ? IR FLUORO GUIDE CV LINE RIGHT  07/17/2021  ? IR THORACENTESIS ASP PLEURAL SPACE W/IMG GUIDE  09/11/2021  ? IR THORACENTESIS ASP PLEURAL SPACE W/IMG GUIDE  12/05/2021  ? IR US GUIDE VASC ACCESS LEFT  09/12/2020  ? IR US GUIDE VASC ACCESS RIGHT  05/25/2017  ? IR US GUIDE VASC  ACCESS RIGHT  07/17/2021  ? LIGATION OF ARTERIOVENOUS  FISTULA Left 01/10/2021  ? Procedure: LIGATION OF LEFT ARM RADIOCEPHALIC FISTULA;  Surgeon: Serafina Mitchell, MD;  Location: [facility];  Service: Vascular;  Laterality: Left;  ? PERIPHERAL VASCULAR BALLOON ANGIOPLASTY Left 01/15/2021  ? Procedure: PERIPHERAL VASCULAR BALLOON ANGIOPLASTY;  Surgeon: Serafina Mitchell, MD;  Location: [facility];  Service: Cardiovascular;  Laterality: Left;  AVF  ? REVISON OF ARTERIOVENOUS FISTULA Left 07/18/2019  ? Procedure: REVISION PLICATION OF RADIOCEPHALIC ARTERIOVENOUS FISTULA LEFT ARM;  Surgeon: Angelia Mould, MD;  Location: [facility] OR;  Service: Vascular;  Laterality: Left;  ? REVISON OF ARTERIOVENOUS FISTULA Left 01/10/2021  ? Procedure: CONVERSION TO BRACHIOCEPHALIC ARTERIOVENOUS FISTULA;  Surgeon: Serafina Mitchell, MD;  Location: [facility];  Service: Vascular;  Laterality: Left;  ? RINOPLASTY    ? ?Family History  ?Problem Relation Age of Onset  ? Cancer Mother   ? ?Social History: ? reports that he has never smoked. He has never used smokeless tobacco. He reports that he does not currently use alcohol. He reports that he does not use drugs. ? ?ROS: As per HPI otherwise negative. ? ?Physical Exam: ?Vitals:  ? 01/09/22  1106 01/09/22 1200 01/09/22 1244 01/09/22 1300  ?BP: (!) 171/95 (!) 156/83  (!) 159/88  ?Pulse: 94 84 86 86  ?Resp: (!) '25 17 19 '$ (!) 23  ?Temp: 97.9 ?F (36.6 ?C)     ?SpO2: (!) 86% 92% (!) 88% 92%  ?Weight:      ?Height:      ?   ?General: Thin appearing male, alert and in NAD ?Head: Normocephalic, atraumatic, sclera non-icteric, mucus membranes are moist. ?Lungs: Decreased breath sounds right lower lobe. Scattered expiratory crackles left lower lobe. On O2 via [employer] ?Heart: RRR with normal S1, S2. No murmurs, rubs, or gallops appreciated. ?Abdomen: Soft, non-tender, non-distended with normoactive bowel sounds. No rebound/guarding. No obvious abdominal masses. ?Lower extremities: No edema bilateral lower  extremities ?Neuro: Alert and oriented X 3. Moves all extremities spontaneously. ?Psych:  Responds to questions appropriately with a normal affect. ?Dialysis Access: R IJ TDC, maturing RUE AVF + t/b ? ?Allergi

## 2022-01-09 NOTE — Assessment & Plan Note (Addendum)
Continue cardizem. Has lopressor on his med list, but this was not on his d/c medication. Will hold this and just continue cardizem for now.  ? ?

## 2022-01-09 NOTE — Progress Notes (Signed)
Admitted the ER per bed and placed in rm 5. Pt c/o difficulty breathing. Breathing labored at 32. Oxygen increased to 15 L HFNC. C/O right sided CP and RUE. 10/10 pressure. Given '1mg'$  IV at 2132 ?

## 2022-01-10 ENCOUNTER — Inpatient Hospital Stay (HOSPITAL_COMMUNITY): Payer: Medicare Other

## 2022-01-10 DIAGNOSIS — J9621 Acute and chronic respiratory failure with hypoxia: Secondary | ICD-10-CM | POA: Diagnosis not present

## 2022-01-10 LAB — CBC
HCT: 33.9 % — ABNORMAL LOW (ref 39.0–52.0)
Hemoglobin: 10.6 g/dL — ABNORMAL LOW (ref 13.0–17.0)
MCH: 30.3 pg (ref 26.0–34.0)
MCHC: 31.3 g/dL (ref 30.0–36.0)
MCV: 96.9 fL (ref 80.0–100.0)
Platelets: 148 10*3/uL — ABNORMAL LOW (ref 150–400)
RBC: 3.5 MIL/uL — ABNORMAL LOW (ref 4.22–5.81)
RDW: 16.2 % — ABNORMAL HIGH (ref 11.5–15.5)
WBC: 7.7 10*3/uL (ref 4.0–10.5)
nRBC: 0 % (ref 0.0–0.2)

## 2022-01-10 LAB — GLUCOSE, PLEURAL OR PERITONEAL FLUID: Glucose, Fluid: 100 mg/dL

## 2022-01-10 LAB — BASIC METABOLIC PANEL
Anion gap: 15 (ref 5–15)
BUN: 59 mg/dL — ABNORMAL HIGH (ref 8–23)
CO2: 24 mmol/L (ref 22–32)
Calcium: 9.7 mg/dL (ref 8.9–10.3)
Chloride: 99 mmol/L (ref 98–111)
Creatinine, Ser: 8.43 mg/dL — ABNORMAL HIGH (ref 0.61–1.24)
GFR, Estimated: 7 mL/min — ABNORMAL LOW (ref >60)
Glucose, Bld: 103 mg/dL — ABNORMAL HIGH (ref 70–99)
Potassium: 6.2 mmol/L — ABNORMAL HIGH (ref 3.5–5.1)
Sodium: 138 mmol/L (ref 135–145)

## 2022-01-10 LAB — BODY FLUID CELL COUNT WITH DIFFERENTIAL
Eos, Fluid: 2 %
Lymphs, Fluid: 32 %
Monocyte-Macrophage-Serous Fluid: 44 % — ABNORMAL LOW (ref 50–90)
Neutrophil Count, Fluid: 21 % (ref 0–25)
Total Nucleated Cell Count, Fluid: 103 cu mm (ref 0–1000)

## 2022-01-10 LAB — LACTATE DEHYDROGENASE, PLEURAL OR PERITONEAL FLUID: LD, Fluid: 77 U/L — ABNORMAL HIGH (ref 3–23)

## 2022-01-10 LAB — PROTEIN, PLEURAL OR PERITONEAL FLUID: Total protein, fluid: <3 g/dL

## 2022-01-10 LAB — TROPONIN I (HIGH SENSITIVITY): Troponin I (High Sensitivity): 194 ng/L (ref <18)

## 2022-01-10 LAB — GLUCOSE, CAPILLARY: Glucose-Capillary: 111 mg/dL — ABNORMAL HIGH (ref 70–99)

## 2022-01-10 MED ORDER — LORAZEPAM 2 MG/ML IJ SOLN
INTRAMUSCULAR | Status: AC
  Filled 2022-01-10: qty 1

## 2022-01-10 MED ORDER — MELATONIN 3 MG PO TABS
3.0000 mg | ORAL_TABLET | Freq: Once | ORAL | Status: AC | PRN
  Administered 2022-01-10: 3 mg via ORAL
  Filled 2022-01-10: qty 1

## 2022-01-10 MED ORDER — NEPRO/CARBSTEADY PO LIQD
237.0000 mL | Freq: Two times a day (BID) | ORAL | Status: DC
  Administered 2022-01-12 – 2022-01-14 (×3): 237 mL via ORAL

## 2022-01-10 MED ORDER — LORAZEPAM 2 MG/ML IJ SOLN
1.0000 mg | Freq: Once | INTRAMUSCULAR | Status: AC
  Administered 2022-01-10: 0.5 mg via INTRAVENOUS

## 2022-01-10 MED ORDER — MEDIHONEY WOUND/BURN DRESSING EX PSTE
1.0000 "application " | PASTE | Freq: Every day | CUTANEOUS | Status: DC
  Administered 2022-01-10 – 2022-01-12 (×3): 1 via TOPICAL
  Filled 2022-01-10: qty 44

## 2022-01-10 MED ORDER — HEPARIN SODIUM (PORCINE) 1000 UNIT/ML IJ SOLN
INTRAMUSCULAR | Status: AC
  Filled 2022-01-10: qty 4

## 2022-01-10 NOTE — Progress Notes (Signed)
Was asked to see patient for shortness of breath, hypoxemia ? ?Was seen by [facility] 01/09/2022 for right pleural effusion ? ?Consideration was that it was going to improve post dialysis however, he continues to have significant shortness of breath and hypoxemia ? ?I was asked to see him for possible thoracentesis ? ?This was accomplished bedside removing 1300 cc of clear serous fluid ?-Fluid sent for analysis ? ?Middle-age gentleman, does not appear to be in distress ?Moist oral mucosa ?Decreased air entry at right base ?S1-S2 appreciated ?Bowel sounds appreciated ? ?Chest x-ray post thoracentesis pending ? ?Assessment/plan hypoxemic respiratory failure ?Right pleural effusion ?Heart failure with preserved ejection fraction ?End-stage renal disease on hemodialysis ?Paroxysmal atrial fibrillation ?Pulmonary hypertension ? ?Follow-up on chest x-ray ?Follow-up on pleural fluid analysis ? ?Will continue to follow ? ?Sherrilyn Rist, MD ?[facility] ?Pager: See Amion ? ? ?

## 2022-01-10 NOTE — Consult Note (Addendum)
WOC Nurse Consult Note: ?Reason for Consult: Consult requested for sacrum ?Pt is very emaciated and has an Unstageable pressure injury to the sacrum where is bone protrudes outward.  .5X.5 cm, 50% yellow dry slough, 50% red and dry, no odor, drainage, or fluctuance. ?Pressure Injury POA: Yes ?Dressing procedure/placement/frequency: Topical treatment orders provided for bedside nurses to perform as follows to assist with removal of nonviable tissue: Apply Medihoney to sacrum wound Q day, then cover with foam dressing. (Change foam dressing Q 3 days or PRN soiling.) ?Please re-consult if further assistance is needed.  Thank-you,  ?Julien Girt MSN, RN, [place], [place], CNS ?[phone]  ?  ?

## 2022-01-10 NOTE — Progress Notes (Signed)
?                                  PROGRESS NOTE                                             ?                                                                                                                     ?                                         ? ? Patient Demographics:  ? ? Thomas Robinson, is a 61 y.o. male, DOB - 27-Apr-1961, HKV:[id number] ? ?Outpatient Primary MD for the patient is [name], [place]    LOS - 1  Admit date - 01/09/2022   ? ?Chief Complaint  ?Patient presents with  ? Chest Pain  ? Shortness of Breath  ? Dizziness  ?    ? ?Brief Narrative (HPI from H&P)   61 y.o. male with medical history significant of  ESRD on MWF HD, HFpEF (EF 60-65%), chronic respiratory failure with hypoxia on 4 L O2 via [place], paroxysmal atrial fibrillation on Eliquis, recurrent right pleural effusion, COPD, history of CVA, HTN, anemia of chronic kidney disease who presented to ED with complaints of worsening shortness of breath that started after dialysis yesterday in the evening.  Apparently patient is not compliant with his outpatient dialysis procedures and does not finish his full dialysis cycle, has had multiple admissions for fluid overload due to missed dialysis.  In the ER this admission he was diagnosed with acute on chronic hypoxic respiratory failure requiring 15 L of oxygen due to fluid overload with large right-sided pleural effusion and admitted to the hospital. ? ? Subjective:  ? ? Thomas Robinson today has, No headache, No chest pain, No abdominal pain - No Nausea, No new weakness tingling or numbness, + SOB. ? ? Assessment  & Plan :  ? ? ?Acute on chronic respiratory failure with hypoxia (HCC) in a patient who uses 4 L nasal cannula oxygen at baseline currently requiring between 8 to 15 L of high flow oxygen.  Large recurrent right-sided pleural effusion, multiple missed dialysis sessions. ? ?He clearly has fluid overload and worsening right-sided pleural effusion from multiple  missed and incomplete dialysis sessions in the outpatient setting, he has been again counseled on compliance, nephrology seeing the patient and he is undergoing dialysis today for fluid removal.  Since his large right sided pleural effusion is worse we will request radiology to do a ultrasound-guided therapeutic and diagnostic thoracentesis on 01/10/2022.  Continue supportive care with supplemental oxygen and monitor. ? ?2.  ESRD on dialysis on HD MWF-but noncompliant with hyperkalemia  - poor compliance with dialysis and typically leaves session early, gently in fluid overload, nephrology on board.  Dialysis per nephrology ? ?3. Chest pain with elevated troponin -  Intermittent chest pain with some tingling down left arm that comes and goes.  Unremarkable EKG, left heart catheterization in December 2021 showed normal coronaries, troponin trend flat and not consistent with ACS, flat elevated troponin due to underlying ESRD and fluid overload with acute on chronic respiratory causing hypoxia and CHF.  Supportive care and monitor. ? ?4. Acute on chronic diastolic (congestive) heart failure ([place]) -  see #1 and 2 above. ? ?5. Pleural effusion, right -  see #1 above  ? ?6. Paroxysmal atrial fibrillation (HCC)  - rate controlled, keep on telemetry, Continue eliquis and cardizem ? ?7. COPD (chronic obstructive pulmonary disease) (HCC) - No signs of exacerbation on exam, Continue with breo daily and SABA prn  ? ?8. Essential hypertension - Continue cardizem. Has lopressor on his med list, but this was not on his d/c medication. Will hold this and just continue cardizem for now.  Also needs as needed midodrine during dialysis treatments. ? ?9. History of anemia due to chronic kidney disease ?Stable, continue to monitor  ? ?10. Decubitus ulcer - Wound care consult, appreciate  ? ?11.  Severe protein calorie malnutrition.  Placed on protein supplementation. ? ? ? ?   ? ?Condition - Extremely Guarded ? ?Family Communication  :   none present ? ?Code Status :  Full ? ?Consults  :  Renal ? ?PUD Prophylaxis :  ? ? Procedures  :    ? ?  ? ?   ? ?Disposition Plan  :   ? ?Status is: Inpatient ? ?DVT Prophylaxis  :   ? ?apixaban (ELIQUIS) tablet 2.5 mg Start: 01/09/22 2200 ?apixaban (ELIQUIS) tablet 2.5 mg  ? ? ?Lab Results  ?Component Value Date  ? PLT 148 (L) 01/10/2022  ? ? ?Diet :  ?Diet Order   ? ?       ?  Diet renal with fluid restriction Fluid restriction: 1200 mL Fluid; Room service appropriate? Yes; Fluid consistency: Thin  Diet effective now       ?  ? ?  ?  ? ?  ?  ? ?Inpatient Medications ? ?Scheduled Meds: ? apixaban  2.5 mg Oral BID  ? Chlorhexidine Gluconate Cloth  6 each Topical Q0600  ? cinacalcet  60 mg Oral Q supper  ? diltiazem  300 mg Oral q morning  ? feeding supplement  237 mL Oral Daily  ? fluticasone furoate-vilanterol  1 puff Inhalation Daily  ? midodrine  10 mg Oral Once  ? multivitamin  1 tablet Oral q morning  ? sevelamer carbonate  800 mg Oral TID with meals  ? sodium chloride flush  3 mL Intravenous Q12H  ? ?Continuous Infusions: ? sodium chloride    ? ?PRN Meds:.sodium chloride, acetaminophen **OR** acetaminophen, albuterol, morphine injection, nitroGLYCERIN, oxyCODONE, sodium chloride flush ? ?Antibiotics  :   ? ?Anti-infectives (From admission, onward)  ? ? None  ? ?  ? ? ? Time Spent in minutes  30 ? ? ?Lala Lund M.D on 01/10/2022 at 9:00 AM ? ?To page go to [email or web address]  ? ?[facility] -  Office  [phone] ? ?See all Orders from today for further details ? ? ? Objective:  ? ?Vitals:  ? 01/09/22 2308 01/10/22 0303 01/10/22 0500 01/10/22 0714  ?  BP: (!) 140/47 (!) 136/46    ?Pulse: 96 88    ?Resp: 20 20  (!) 24  ?Temp: 98 ?F (36.7 ?C) 98 ?F (36.7 ?C)    ?TempSrc: Oral Oral    ?SpO2: 95% 94%  91%  ?Weight:   50.2 kg   ?Height:      ? ? ?Wt Readings from Last 3 Encounters:  ?01/10/22 50.2 kg  ?12/20/21 43.6 kg  ?12/10/21 44.8 kg  ? ? ?No intake or output data in the 24 hours ending 01/10/22  0900 ? ? ?Physical Exam ? ?Awake Alert, No new F.N deficits, Normal affect ?[place].AT,PERRAL ?Supple Neck, No JVD,   ?Symmetrical Chest wall movement, reduced breath sounds at both bases right less than left ?RRR,No Gallops,Rubs or new Murmurs,  ?+ve B.Sounds, Abd Soft, No tenderness,   ?No Cyanosis, Clubbing or edema  ?  ? ?RN pressure injury documentation: ?Pressure Injury 12/14/21 Sacrum Medial Stage 2 -  Partial thickness loss of dermis presenting as a shallow open injury with a red, pink wound bed without slough. pink (Active)  ?12/14/21 2130  ?Location: Sacrum  ?Location Orientation: Medial  ?Staging: Stage 2 -  Partial thickness loss of dermis presenting as a shallow open injury with a red, pink wound bed without slough.  ?Wound Description (Comments): pink  ?Present on Admission: Yes  ?Dressing Type Foam - Lift dressing to assess site every shift 12/20/21 1055  ? ? ? Data Review:  ? ? ?CBC ?Recent Labs  ?Lab 01/09/22 ?1316 01/09/22 ?1339 01/10/22 ?0137  ?WBC 6.3  --  7.7  ?HGB 11.9* 13.3 10.6*  ?HCT 39.5 39.0 33.9*  ?PLT 154  --  148*  ?MCV 99.0  --  96.9  ?MCH 29.8  --  30.3  ?MCHC 30.1  --  31.3  ?RDW 16.4*  --  16.2*  ?LYMPHSABS 0.5*  --   --   ?MONOABS 0.3  --   --   ?EOSABS 0.3  --   --   ?BASOSABS 0.1  --   --   ? ? ?Electrolytes ?Recent Labs  ?Lab 01/09/22 ?1316 01/09/22 ?1339 01/10/22 ?0137  ?NA 138 136 138  ?K 6.2* 5.9* 6.2*  ?CL 98  --  99  ?CO2 23  --  24  ?GLUCOSE 115*  --  103*  ?BUN 53*  --  59*  ?CREATININE 7.99*  --  8.43*  ?CALCIUM 10.4*  --  9.7  ?AST 18  --   --   ?ALT 12  --   --   ?ALKPHOS 94  --   --   ?BILITOT 1.5*  --   --   ?ALBUMIN 3.2*  --   --   ?BNP 4,241.2*  --   --   ? ? ?Radiology Reports ?DG Chest Port 1 View ? ?Result Date: 01/09/2022 ?CLINICAL DATA:  sob, chest pain starting yesterday after dialysis, dizziness EXAM: PORTABLE CHEST 1 VIEW COMPARISON:  December 15, 2021 FINDINGS: Again seen is the right transjugular tunneled dialysis catheter. Cardiomegaly. Moderately large right  pleural effusion and has worsened in the interim. Pulmonary vascular congestion appears overall stable. Bibasilar atelectasis. The visualized skeletal structures are unremarkable. IMPRESSION: Moderately large right

## 2022-01-10 NOTE — Progress Notes (Signed)
Initial Nutrition Assessment ? ?DOCUMENTATION CODES:  ? ?Underweight ? ?INTERVENTION:  ? ?Renal Multivitamin w/ minerals daily ?Nepro Shake po BID, each supplement provides 425 kcal and 19 grams protein ?Liberalize pt diet to regular with 1200 mL fluid restriction due to hx of malnutrition  ?Encourage good PO intake  ?Meal ordering with assist ? ?NUTRITION DIAGNOSIS:  ? ?Increased nutrient needs related to chronic illness (CHF, ESRD, COPD) as evidenced by estimated needs. ? ?GOAL:  ? ?Patient will meet greater than or equal to 90% of their needs ? ?MONITOR:  ? ?PO intake, Supplement acceptance, Labs, Weight trends, I & O's ? ?REASON FOR ASSESSMENT:  ? ?Malnutrition Screening Tool ?  ? ?ASSESSMENT:  ? ?61 y.o. male presented to the ED with shortness of breath. PMH includes ESRD on HD (MWF), COPD, CVA, GERD, HTN, CHF, and malnutrition. Pt admitted with acute on chronic respiratory failure 2/2 to volume overload, acute on chronic CHF, and noncompliant ESRD on HD.  ? ?5/12 - Thoracentesis, removed 11300 cc   ? ?RD attempted to visit pt x2. ? ?Pt with history of malnutrition and currently has an unstageable wound. RD to provide pt with ONS to increase protein and calorie intake. ? ?EDW: 44 kg, CBW: 49.3 kg (currently above EDW) ? ?Medications reviewed and include: Cinacalcet, Rena-Vit, Renvela ?Labs reviewed: Potassium 6.2  ? ?NUTRITION - FOCUSED PHYSICAL EXAM: ? ?Deferred to follow-up.  ? ?Diet Order:   ?Diet Order   ? ?       ?  Diet renal with fluid restriction Fluid restriction: 1200 mL Fluid; Room service appropriate? Yes; Fluid consistency: Thin  Diet effective now       ?  ? ?  ?  ? ?  ? ? ?EDUCATION NEEDS:  ? ?No education needs have been identified at this time ? ?Skin:  Skin Integrity Issues:: Unstageable ?Unstageable: Sacrum ? ?Last BM:  PTA ? ?Height:  ? ?Ht Readings from Last 1 Encounters:  ?01/09/22 '5\' 8"'$  (1.727 m)  ? ? ?Weight:  ? ?Wt Readings from Last 1 Encounters:  ?01/10/22 49.3 kg  ? ? ?Ideal Body  Weight:  70 kg ? ?BMI:  Body mass index is 16.53 kg/m?. ? ?Estimated Nutritional Needs:  ? ?Kcal:  1500-1700 ? ?Protein:  75-90 grams ? ?Fluid:  UOP + 1L ? ? ? ?Hermina Barters RD, LDN ?Clinical Dietitian ?See AMiON for contact information.  ? ?

## 2022-01-10 NOTE — Progress Notes (Signed)
?[facility] ?Progress Note  ? ?Subjective:   Dyspnea overnight.  Saw this AM when starting dialysis - agreeable to trying to run the full treatment.  ? ?Objective ?Vitals:  ? 01/09/22 2308 01/10/22 0303 01/10/22 0500 01/10/22 0714  ?BP: (!) 140/47 (!) 136/46    ?Pulse: 96 88    ?Resp: 20 20  (!) 24  ?Temp: 98 ?F (36.7 ?C) 98 ?F (36.7 ?C)    ?TempSrc: Oral Oral    ?SpO2: 95% 94%  91%  ?Weight:   50.2 kg   ?Height:      ? ?Physical Exam ?General: cachetic, no distress ?Heart: RRR ?Lungs:  clear ant, dec R base ?Abdomen: scaphoid, nontender ?Extremities: no edema ?Dialysis Access:  RIJ TDC c/d/i ? ?Additional Objective ?Labs: ?Basic Metabolic Panel: ?Recent Labs  ?Lab 01/09/22 ?1316 01/09/22 ?1339 01/10/22 ?0137  ?NA 138 136 138  ?K 6.2* 5.9* 6.2*  ?CL 98  --  99  ?CO2 23  --  24  ?GLUCOSE 115*  --  103*  ?BUN 53*  --  59*  ?CREATININE 7.99*  --  8.43*  ?CALCIUM 10.4*  --  9.7  ? ?Liver Function Tests: ?Recent Labs  ?Lab 01/09/22 ?1316  ?AST 18  ?ALT 12  ?ALKPHOS 94  ?BILITOT 1.5*  ?PROT 7.4  ?ALBUMIN 3.2*  ? ?No results for input(s): LIPASE, AMYLASE in the last 168 hours. ?CBC: ?Recent Labs  ?Lab 01/09/22 ?1316 01/09/22 ?1339 01/10/22 ?0137  ?WBC 6.3  --  7.7  ?NEUTROABS 5.1  --   --   ?HGB 11.9* 13.3 10.6*  ?HCT 39.5 39.0 33.9*  ?MCV 99.0  --  96.9  ?PLT 154  --  148*  ? ?Blood Culture ?   ?Component Value Date/Time  ? SDES PLEURAL 12/15/2021 1001  ? SDES PLEURAL 12/15/2021 1001  ? SPECREQUEST RIGHT LUNG 12/15/2021 1001  ? SPECREQUEST RIGHT LUNG 12/15/2021 1001  ? CULT  12/15/2021 1001  ?  NO GROWTH 5 DAYS ?Performed at [facility], [place] [address] [place], [place] [address] ?  ? REPTSTATUS 12/20/2021 FINAL 12/15/2021 1001  ? REPTSTATUS 12/15/2021 FINAL 12/15/2021 1001  ? ? ?Cardiac Enzymes: ?No results for input(s): CKTOTAL, CKMB, CKMBINDEX, TROPONINI in the last 168 hours. ?CBG: ?No results for input(s): GLUCAP in the last 168 hours. ?Iron Studies: No results for input(s): IRON, TIBC,  TRANSFERRIN, FERRITIN in the last 72 hours. ?'@lablastinr3'$ @ ?Studies/Results: ?DG Chest Port 1 View ? ?Result Date: 01/09/2022 ?CLINICAL DATA:  sob, chest pain starting yesterday after dialysis, dizziness EXAM: PORTABLE CHEST 1 VIEW COMPARISON:  December 15, 2021 FINDINGS: Again seen is the right transjugular tunneled dialysis catheter. Cardiomegaly. Moderately large right pleural effusion and has worsened in the interim. Pulmonary vascular congestion appears overall stable. Bibasilar atelectasis. The visualized skeletal structures are unremarkable. IMPRESSION: Moderately large right pleural effusion and has increased in the interim. Pulmonary vascular congestion appears stable. Cardiomegaly. Electronically Signed   By: Frazier Richards M.D.   On: 01/09/2022 13:17   ?Medications: ? sodium chloride    ? ? apixaban  2.5 mg Oral BID  ? Chlorhexidine Gluconate Cloth  6 each Topical Q0600  ? cinacalcet  60 mg Oral Q supper  ? diltiazem  300 mg Oral q morning  ? feeding supplement  237 mL Oral Daily  ? fluticasone furoate-vilanterol  1 puff Inhalation Daily  ? midodrine  10 mg Oral Once  ? multivitamin  1 tablet Oral q morning  ? sevelamer carbonate  800 mg  Oral TID with meals  ? sodium chloride flush  3 mL Intravenous Q12H  ? ?Dialysis Orders:  ?Center: [facility]  on MWF . ?180NRe, 3 hour 2mn, BFR 400 DFR 800, EDW 44kg, 2K 2Ca, TDC, AVF maturing ?No heparin ?Mircera 2289m IV q 2 weeks- last dose 01/08/22 ?  ?  ?Assessment/Plan: ? Chest pain: Volume overload is likely contributing to demand ischemia. Further work up per primary team ? ESRD:  Attends dialysis on MWF schedule but shortens every treatment to 1-1.5 hours because he worries he will have chest pain. Discussed that unless he is able to stay on dialysis longer, he will stay in this cycle of hospitalizations for volume overload. He reports he is willing to try to stay on HD for longer - confirmed today. ?Hyperkalemia: Due to insufficient HD, expect resolution with  dialysis ? Hypertension/volume: Volume overloaded and hypertensive. UF with HD today as tolerated. Encouraged to stay his full treatment ?R sided pleural effusion: Most recent thoracentesis 12/15/21, consider thoracentesis this admission - defer to primary ? Anemia: Hgb is at goal. ESA given yesterday. Will continue to monitor trend ? Metabolic bone disease: Calcium elevated. Not on VDRA, will use low calcium bath and resume home sensipar. Follow phos trend ? Nutrition:  Albumin 3.2. Needs renal diet ? ?LiJannifer HickD ?01/10/2022, 8:36 AM  ?[facility] ?Pager: [phone] ? ?

## 2022-01-10 NOTE — Progress Notes (Signed)
PT Cancellation Note ? ?Patient Details ?Name: Thomas Robinson ?MRN: [id number] ?DOB: [birth year] ? ? ?Cancelled Treatment:    Reason Eval/Treat Not Completed: Patient not medically ready. Patient at MEWs of 4 due to HR 120 and RR of 27. Will hold for now and attempt evaluation tomorrow if appropriate.  ? ? ?[name] ?01/10/2022, 2:02 PM ?

## 2022-01-10 NOTE — Procedures (Signed)
Thoracentesis Procedure Note ? ?Pre-operative Diagnosis: Right pleural effusion ? ?Post-operative Diagnosis: same ? ?Indications: Shortness of breath, hypoxemia ? ?Procedure Details  ? ?Consent: Informed consent was obtained. Risks of the procedure were discussed including: infection, bleeding, pain, pneumothorax. ? ?Under sterile conditions the patient was positioned. Betadine solution and sterile drapes were utilized.  1% plain lidocaine was used to anesthetize the 8th rib space. Fluid was obtained without any difficulties and minimal blood loss.  A dressing was applied to the wound and wound care instructions were provided.  ? ?Findings ?1300 ml of clear pleural fluid was obtained. A sample was sent to Pathology for cytogenetics, flow, and cell counts, as well as for infection analysis. ? ?Complications:  None; patient tolerated the procedure well. ?       ?Condition: stable ? ?Plan ?A follow up chest x-ray was ordered. ?Bed Rest for 1 hours. ?Tylenol 650 mg. for pain. ? ?Attending Attestation: I performed the procedure. ? ? ?Fluid sent for analysis ?

## 2022-01-10 NOTE — Progress Notes (Signed)
Pt receives out-pt HD at [facility] on MWF. Pt arrives at 1:00 for 1:20 chair time. Will assist as needed.  ? ?Thomas Robinson ?Renal Navigator ?[phone] ?

## 2022-01-11 ENCOUNTER — Inpatient Hospital Stay (HOSPITAL_COMMUNITY): Payer: Medicare Other

## 2022-01-11 DIAGNOSIS — I5033 Acute on chronic diastolic (congestive) heart failure: Secondary | ICD-10-CM | POA: Diagnosis not present

## 2022-01-11 DIAGNOSIS — J9621 Acute and chronic respiratory failure with hypoxia: Secondary | ICD-10-CM | POA: Diagnosis not present

## 2022-01-11 LAB — CBC WITH DIFFERENTIAL/PLATELET
Abs Immature Granulocytes: 0.01 10*3/uL (ref 0.00–0.07)
Basophils Absolute: 0.1 10*3/uL (ref 0.0–0.1)
Basophils Relative: 2 %
Eosinophils Absolute: 0.3 10*3/uL (ref 0.0–0.5)
Eosinophils Relative: 5 %
HCT: 33.2 % — ABNORMAL LOW (ref 39.0–52.0)
Hemoglobin: 10.1 g/dL — ABNORMAL LOW (ref 13.0–17.0)
Immature Granulocytes: 0 %
Lymphocytes Relative: 7 %
Lymphs Abs: 0.4 10*3/uL — ABNORMAL LOW (ref 0.7–4.0)
MCH: 30.5 pg (ref 26.0–34.0)
MCHC: 30.4 g/dL (ref 30.0–36.0)
MCV: 100.3 fL — ABNORMAL HIGH (ref 80.0–100.0)
Monocytes Absolute: 0.3 10*3/uL (ref 0.1–1.0)
Monocytes Relative: 5 %
Neutro Abs: 4.8 10*3/uL (ref 1.7–7.7)
Neutrophils Relative %: 81 %
Platelets: 147 10*3/uL — ABNORMAL LOW (ref 150–400)
RBC: 3.31 MIL/uL — ABNORMAL LOW (ref 4.22–5.81)
RDW: 16 % — ABNORMAL HIGH (ref 11.5–15.5)
WBC: 5.9 10*3/uL (ref 4.0–10.5)
nRBC: 0 % (ref 0.0–0.2)

## 2022-01-11 LAB — ECHOCARDIOGRAM COMPLETE
AR max vel: 1.3 cm2
AV Area VTI: 1.46 cm2
AV Area mean vel: 1.28 cm2
AV Mean grad: 19 mmHg
AV Peak grad: 33.9 mmHg
Ao pk vel: 2.91 m/s
Area-P 1/2: 2.96 cm2
Height: 68 in
MV VTI: 1.27 cm2
P 1/2 time: 486 msec
S' Lateral: 2.6 cm
Weight: 1703.71 oz

## 2022-01-11 LAB — COMPREHENSIVE METABOLIC PANEL
ALT: 9 U/L (ref 0–44)
AST: 13 U/L — ABNORMAL LOW (ref 15–41)
Albumin: 2.4 g/dL — ABNORMAL LOW (ref 3.5–5.0)
Alkaline Phosphatase: 77 U/L (ref 38–126)
Anion gap: 12 (ref 5–15)
BUN: 35 mg/dL — ABNORMAL HIGH (ref 8–23)
CO2: 24 mmol/L (ref 22–32)
Calcium: 9.1 mg/dL (ref 8.9–10.3)
Chloride: 100 mmol/L (ref 98–111)
Creatinine, Ser: 5.3 mg/dL — ABNORMAL HIGH (ref 0.61–1.24)
GFR, Estimated: 12 mL/min — ABNORMAL LOW (ref >60)
Glucose, Bld: 104 mg/dL — ABNORMAL HIGH (ref 70–99)
Potassium: 4.6 mmol/L (ref 3.5–5.1)
Sodium: 136 mmol/L (ref 135–145)
Total Bilirubin: 1 mg/dL (ref 0.3–1.2)
Total Protein: 5.9 g/dL — ABNORMAL LOW (ref 6.5–8.1)

## 2022-01-11 LAB — RENAL FUNCTION PANEL
Albumin: 2.4 g/dL — ABNORMAL LOW (ref 3.5–5.0)
Anion gap: 12 (ref 5–15)
BUN: 35 mg/dL — ABNORMAL HIGH (ref 8–23)
CO2: 26 mmol/L (ref 22–32)
Calcium: 9.3 mg/dL (ref 8.9–10.3)
Chloride: 100 mmol/L (ref 98–111)
Creatinine, Ser: 5.14 mg/dL — ABNORMAL HIGH (ref 0.61–1.24)
GFR, Estimated: 12 mL/min — ABNORMAL LOW (ref >60)
Glucose, Bld: 102 mg/dL — ABNORMAL HIGH (ref 70–99)
Phosphorus: 6.4 mg/dL — ABNORMAL HIGH (ref 2.5–4.6)
Potassium: 4.7 mmol/L (ref 3.5–5.1)
Sodium: 138 mmol/L (ref 135–145)

## 2022-01-11 LAB — LACTATE DEHYDROGENASE: LDH: 167 U/L (ref 98–192)

## 2022-01-11 LAB — TROPONIN I (HIGH SENSITIVITY): Troponin I (High Sensitivity): 197 ng/L (ref <18)

## 2022-01-11 LAB — MAGNESIUM: Magnesium: 2.3 mg/dL (ref 1.7–2.4)

## 2022-01-11 MED ORDER — CARVEDILOL 3.125 MG PO TABS
3.1250 mg | ORAL_TABLET | Freq: Two times a day (BID) | ORAL | Status: DC
  Administered 2022-01-11 – 2022-01-12 (×3): 3.125 mg via ORAL
  Filled 2022-01-11 (×4): qty 1

## 2022-01-11 MED ORDER — HEPARIN SODIUM (PORCINE) 1000 UNIT/ML IJ SOLN
INTRAMUSCULAR | Status: AC
  Filled 2022-01-11: qty 4

## 2022-01-11 NOTE — Progress Notes (Signed)
OT Cancellation Note ? ?Patient Details ?Name: Thomas Robinson ?MRN: [id number] ?DOB: 1961/04/07 ? ? ?Cancelled Treatment:    Reason Eval/Treat Not Completed: Fatigue/lethargy limiting ability to participate: Pt listened to role of OT and goal of completion of assessment today, however pt refused based on fatigue and preference to rest. Pt reported dialysis scheduled later this afternoon, therefore explained that OT will likely attempt again tomorrow and pt agreeable to this.  ? ?Julien Girt ?01/11/2022, 1:45 PM ?

## 2022-01-11 NOTE — Progress Notes (Signed)
?                                  PROGRESS NOTE                                             ?                                                                                                                     ?                                         ? ? Patient Demographics:  ? ? Thomas Robinson, is a 61 y.o. male, DOB - [birth year], ZOX:[id number] ? ?Outpatient Primary MD for the patient is [name], [place]    LOS - 2  Admit date - 01/09/2022   ? ?Chief Complaint  ?Patient presents with  ? Chest Pain  ? Shortness of Breath  ? Dizziness  ?    ? ?Brief Narrative (HPI from H&P)   61 y.o. male with medical history significant of  ESRD on MWF HD, HFpEF (EF 60-65%), chronic respiratory failure with hypoxia on 4 L O2 via [place], paroxysmal atrial fibrillation on Eliquis, recurrent right pleural effusion, COPD, history of CVA, HTN, anemia of chronic kidney disease who presented to ED with complaints of worsening shortness of breath that started after dialysis yesterday in the evening.  Apparently patient is not compliant with his outpatient dialysis procedures and does not finish his full dialysis cycle, has had multiple admissions for fluid overload due to missed dialysis.  In the ER this admission he was diagnosed with acute on chronic hypoxic respiratory failure requiring 15 L of oxygen due to fluid overload with large right-sided pleural effusion and admitted to the hospital. ? ? Subjective:  ? ?Patient in bed, appears comfortable, denies any headache, no fever, no chest pain or pressure, improved shortness of breath , no abdominal pain. No new focal weakness. ? ? ? Assessment  & Plan :  ? ? ?Acute on chronic respiratory failure with hypoxia (HCC) in a patient who uses 4 L nasal cannula oxygen at baseline upon admission was requiring 15 L of high flow oxygen.  Large recurrent right-sided pleural effusion, multiple missed dialysis sessions. ? ?He clearly has fluid overload and worsening  right-sided pleural effusion from multiple missed and incomplete dialysis sessions in the outpatient setting, he has been again counseled on compliance, nephrology seeing the patient and he is undergoing dialysis for fluid removal, again being numbed noncompliant even in the hospital, counseled again, he also underwent diagnostic and therapeutic right-sided thoracentesis by pulmonary critical care on 01/10/2022 with 1.5 L of transudative fluid removed, follow cytology.  Shortness of breath has improved and currently on 7 L of nasal cannula oxygen up from 4 L at home, repeat dialysis session today, chest x-ray noted will repeat again tomorrow morning. ? ?2. ESRD on dialysis on HD MWF-but noncompliant with hyperkalemia  - poor compliance with dialysis and typically leaves session early, gently in fluid overload, nephrology on board.  Dialysis per nephrology, case discussed with nephrology team on 01/11/2022 ? ?3. Chest pain with elevated troponin -  Intermittent chest pain with some tingling down left arm that comes and goes.  Unremarkable EKG, left heart catheterization in December 2021 showed normal coronaries, troponin trend flat and not consistent with ACS, flat elevated troponin due to underlying ESRD and fluid overload with acute on chronic respiratory causing hypoxia and CHF.  Supportive care and monitor. ? ?4. Acute on chronic diastolic (congestive) heart failure ([place]) -  see #1 and 2 above. ? ?5. Pleural effusion, right -  see #1 above  ? ?6. Paroxysmal atrial fibrillation (HCC)  - rate controlled, keep on telemetry, Continue eliquis and cardizem ? ?7. COPD (chronic obstructive pulmonary disease) (HCC) - No signs of exacerbation on exam, Continue with breo daily and SABA prn  ? ?8. Essential hypertension - Continue cardizem, add low-dose Coreg for better control. ? ?9. History of anemia due to chronic kidney disease ?Stable, continue to monitor  ? ?10. Decubitus ulcer - Wound care consult, appreciate  ? ?11.   Severe protein calorie malnutrition.  Placed on protein supplementation. ? ? ? ?   ? ?Condition - Extremely Guarded ? ?Family Communication  :  none present ? ?Code Status :  Full ? ?Consults  :  Renal ? ?PUD Prophylaxis :  ? ? Procedures  :    ? ?  ? ?   ? ?Disposition Plan  :   ? ?Status is: Inpatient ? ?DVT Prophylaxis  :   ? ?apixaban (ELIQUIS) tablet 2.5 mg Start: 01/09/22 2200 ?apixaban (ELIQUIS) tablet 2.5 mg  ? ? ?Lab Results  ?Component Value Date  ? PLT 147 (L) 01/11/2022  ? ? ?Diet :  ?Diet Order   ? ?       ?  Diet regular Room service appropriate? Yes with Assist; Fluid consistency: Thin; Fluid restriction: 1200 mL Fluid  Diet effective now       ?  ? ?  ?  ? ?  ?  ? ?Inpatient Medications ? ?Scheduled Meds: ? apixaban  2.5 mg Oral BID  ? Chlorhexidine Gluconate Cloth  6 each Topical Q0600  ? cinacalcet  60 mg Oral Q supper  ? diltiazem  300 mg Oral q morning  ? feeding supplement (NEPRO CARB STEADY)  237 mL Oral BID BM  ? fluticasone furoate-vilanterol  1 puff Inhalation Daily  ? leptospermum manuka honey  1 application. Topical Daily  ? midodrine  10 mg Oral Once  ? multivitamin  1 tablet Oral q morning  ? sevelamer carbonate  800 mg Oral TID with meals  ? ?Continuous Infusions: ? ? ?PRN Meds:.acetaminophen **OR** acetaminophen, albuterol, morphine injection, nitroGLYCERIN, oxyCODONE ? ?Antibiotics  :   ? ?Anti-infectives (From admission, onward)  ? ? None  ? ?  ? ? ? Time Spent in minutes  30 ? ? ?Lala Lund M.D on 01/11/2022 at 8:55 AM ? ?To page go to [email or web address]  ? ?[facility] -  Office  [phone] ? ?See all Orders from today for further details ? ? ? Objective:  ? ?Vitals:  ?  01/11/22 0348 01/11/22 0500 01/11/22 0734 01/11/22 0741  ?BP: (!) 115/58   (!) 145/79  ?Pulse:      ?Resp:    19  ?Temp:    98.1 ?F (36.7 ?C)  ?TempSrc:    Oral  ?SpO2:   94% 93%  ?Weight:  48.3 kg    ?Height:      ? ? ?Wt Readings from Last 3 Encounters:  ?01/11/22 48.3 kg  ?12/20/21 43.6 kg  ?12/10/21 44.8  kg  ? ? ? ?Intake/Output Summary (Last 24 hours) at 01/11/2022 0855 ?Last data filed at 01/10/2022 1800 ?Gross per 24 hour  ?Intake 120 ml  ?Output 3067 ml  ?Net -2947 ml  ? ? ? ?Physical Exam ? ?Awake Alert, No new F.N deficits, Normal affect ?[place].AT,PERRAL ?Supple Neck, No JVD,   ?Symmetrical Chest wall movement, reduced breath sounds at both bases right less than left ?RRR,No Gallops, Rubs or new Murmurs,  ?+ve B.Sounds, Abd Soft, No tenderness,   ?No Cyanosis, Clubbing or edema  ? ?  ? ?RN pressure injury documentation: ?Pressure Injury 12/14/21 Sacrum Medial Unstageable - Full thickness tissue loss in which the base of the injury is covered by slough (yellow, tan, gray, green or brown) and/or eschar (tan, brown or black) in the wound bed. (Active)  ?12/14/21 2130  ?Location: Sacrum  ?Location Orientation: Medial  ?Staging: Unstageable - Full thickness tissue loss in which the base of the injury is covered by slough (yellow, tan, gray, green or brown) and/or eschar (tan, brown or black) in the wound bed.  ?Wound Description (Comments):   ?Present on Admission: Yes  ?Dressing Type Foam - Lift dressing to assess site every shift 01/10/22 1955  ? ? ? Data Review:  ? ? ?CBC ?Recent Labs  ?Lab 01/09/22 ?1316 01/09/22 ?1339 01/10/22 ?0137 01/11/22 ?0119  ?WBC 6.3  --  7.7 5.9  ?HGB 11.9* 13.3 10.6* 10.1*  ?HCT 39.5 39.0 33.9* 33.2*  ?PLT 154  --  148* 147*  ?MCV 99.0  --  96.9 100.3*  ?MCH 29.8  --  30.3 30.5  ?MCHC 30.1  --  31.3 30.4  ?RDW 16.4*  --  16.2* 16.0*  ?LYMPHSABS 0.5*  --   --  0.4*  ?MONOABS 0.3  --   --  0.3  ?EOSABS 0.3  --   --  0.3  ?BASOSABS 0.1  --   --  0.1  ? ? ?Electrolytes ?Recent Labs  ?Lab 01/09/22 ?1316 01/09/22 ?1339 01/10/22 ?0137 01/11/22 ?0119  ?NA 138 136 138 136  138  ?K 6.2* 5.9* 6.2* 4.6  4.7  ?CL 98  --  99 100  100  ?CO2 23  --  '24 24  26  '$ ?GLUCOSE 115*  --  103* 104*  102*  ?BUN 53*  --  59* 35*  35*  ?CREATININE 7.99*  --  8.43* 5.30*  5.14*  ?CALCIUM 10.4*  --  9.7 9.1  9.3   ?AST 18  --   --  13*  ?ALT 12  --   --  9  ?ALKPHOS 94  --   --  77  ?BILITOT 1.5*  --   --  1.0  ?ALBUMIN 3.2*  --   --  2.4*  2.4*  ?MG  --   --   --  2.3  ?BNP 4,241.2*  --   --   --   ? ? ?Radiology Reports ?

## 2022-01-11 NOTE — Progress Notes (Signed)
PT Cancellation Note ? ?Patient Details ?Name: Thomas Robinson ?MRN: [id number] ?DOB: Jan 08, 1961 ? ? ?Cancelled Treatment:    Reason Eval/Treat Not Completed: Fatigue/lethargy limiting ability to participate ? ?Patient just completed Echo and adamantly states he is too tired to do anything right now. Attempted to discuss benefits of activity and he again adamantly refused. Noted plans for HD today. Will reattempt as pt and PT's schedules permit.  ? ? ?Arby Barrette, PT ?Acute Rehabilitation Services  ?Pager [phone] ?Office [phone] ? ? ?Jeanie Cooks Merlyn Conley ?01/11/2022, 10:46 AM ?

## 2022-01-11 NOTE — Progress Notes (Signed)
?[facility] ?Progress Note  ? ?Subjective:  Seen in room - remains on nasal O2. CXR this AM with persistent pulm edema + effusions despite dialysis with 1.7L removed and R pleurocentesis with 1.3L removed yesterday. Agreeable for another short HD today to further get volume down. ? ?Objective ?Vitals:  ? 01/11/22 0348 01/11/22 0500 01/11/22 0734 01/11/22 0741  ?BP: (!) 115/58   (!) 145/79  ?Pulse:      ?Resp:    19  ?Temp:    98.1 ?F (36.7 ?C)  ?TempSrc:    Oral  ?SpO2:   94% 93%  ?Weight:  48.3 kg    ?Height:      ? ?Physical Exam ?General: Frail, NAD. Nasal O2 in place. ?Heart: RRR; 3/6 murmur ?Lungs: CTA in upper lobes, reduced air movement in B bases ?Abdomen: soft ?Extremities: No LE edema ?Dialysis Access:  [facility] in R chest ? ?Additional Objective ?Labs: ?Basic Metabolic Panel: ?Recent Labs  ?Lab 01/09/22 ?1316 01/09/22 ?1339 01/10/22 ?0137 01/11/22 ?0119  ?NA 138 136 138 136  138  ?K 6.2* 5.9* 6.2* 4.6  4.7  ?CL 98  --  99 100  100  ?CO2 23  --  '24 24  26  '$ ?GLUCOSE 115*  --  103* 104*  102*  ?BUN 53*  --  59* 35*  35*  ?CREATININE 7.99*  --  8.43* 5.30*  5.14*  ?CALCIUM 10.4*  --  9.7 9.1  9.3  ?PHOS  --   --   --  6.4*  ? ?Liver Function Tests: ?Recent Labs  ?Lab 01/09/22 ?1316 01/11/22 ?0119  ?AST 18 13*  ?ALT 12 9  ?ALKPHOS 94 77  ?BILITOT 1.5* 1.0  ?PROT 7.4 5.9*  ?ALBUMIN 3.2* 2.4*  2.4*  ? ?CBC: ?Recent Labs  ?Lab 01/09/22 ?1316 01/09/22 ?1339 01/10/22 ?0137 01/11/22 ?0119  ?WBC 6.3  --  7.7 5.9  ?NEUTROABS 5.1  --   --  4.8  ?HGB 11.9* 13.3 10.6* 10.1*  ?HCT 39.5 39.0 33.9* 33.2*  ?MCV 99.0  --  96.9 100.3*  ?PLT 154  --  148* 147*  ? ?Blood Culture ?   ?Component Value Date/Time  ? SDES PLEURAL 01/10/2022 1336  ? [place] NONE 01/10/2022 1336  ? CULT PENDING 01/10/2022 1336  ? REPTSTATUS PENDING 01/10/2022 1336  ? ?Studies/Results: ?DG Chest Port 1 View ? ?Result Date: 01/11/2022 ?CLINICAL DATA:  Shortness of breath and COPD. EXAM: PORTABLE CHEST 1 VIEW COMPARISON:   01/10/2022 FINDINGS: Right chest wall dialysis catheter is noted with tip projecting over the cavoatrial junction. Stable cardiomediastinal contours. Moderate right pleural effusion. Mild diffuse interstitial scratch set diffuse interstitial edema pattern is unchanged. Bilateral airspace opacities are identified with worsening aeration to the right mid and right lower lung. IMPRESSION: 1. Right pleural effusion and pulmonary edema, unchanged. 2. Bilateral pulmonary opacities with worsening aeration to the right mid and right lower lung. Electronically Signed   By: Kerby Moors M.D.   On: 01/11/2022 07:22  ? ?DG Chest Port 1 View ? ?Result Date: 01/10/2022 ?CLINICAL DATA:  Shortness of breath EXAM: PORTABLE CHEST 1 VIEW COMPARISON:  01/09/2022 FINDINGS: Tunneled right IJ dialysis catheter is again identified. Persistent but decreased right pleural effusion and adjacent atelectasis. Persistent bilateral patchy opacities and left basilar atelectasis. Stable cardiomegaly. IMPRESSION: Persistent but decreased right pleural effusion and adjacent atelectasis. Persistent bilateral opacities favored to reflect edema. Electronically Signed   By: Macy Mis M.D.   On: 01/10/2022 14:10  ? ?DG Chest  Port 1 View ? ?Result Date: 01/09/2022 ?CLINICAL DATA:  sob, chest pain starting yesterday after dialysis, dizziness EXAM: PORTABLE CHEST 1 VIEW COMPARISON:  December 15, 2021 FINDINGS: Again seen is the right transjugular tunneled dialysis catheter. Cardiomegaly. Moderately large right pleural effusion and has worsened in the interim. Pulmonary vascular congestion appears overall stable. Bibasilar atelectasis. The visualized skeletal structures are unremarkable. IMPRESSION: Moderately large right pleural effusion and has increased in the interim. Pulmonary vascular congestion appears stable. Cardiomegaly. Electronically Signed   By: Frazier Richards M.D.   On: 01/09/2022 13:17   ? ?Medications: ? ? apixaban  2.5 mg Oral BID  ?  carvedilol  3.125 mg Oral BID WC  ? Chlorhexidine Gluconate Cloth  6 each Topical Q0600  ? cinacalcet  60 mg Oral Q supper  ? diltiazem  300 mg Oral q morning  ? feeding supplement (NEPRO CARB STEADY)  237 mL Oral BID BM  ? fluticasone furoate-vilanterol  1 puff Inhalation Daily  ? leptospermum manuka honey  1 application. Topical Daily  ? midodrine  10 mg Oral Once  ? multivitamin  1 tablet Oral q morning  ? sevelamer carbonate  800 mg Oral TID with meals  ? ? ?Dialysis Orders: ?Center: [facility]  on MWF . ?3:30hr, BFR 400 DFR 800, EDW 44kg, 2K 2Ca, TDC, AVF maturing, No heparin ?- Mircera 255mg IV q 2 weeks- last dose 01/08/22 ?   ?Assessment/Plan: ?Chest pain: Volume overload is likely contributing to demand ischemia. Improved today. Per primary. ?ESRD: Usual MWF schedule but shortens every treatment to 1-1.5 hours because he worries he will have chest pain. Discussed dangers, he completed 3hr HD yesterday. Agreeable for only 1.5hr extra HD today.  ?Hyperkalemia: Due to insufficient HD,  improved s/p HD. ? Hypertension/volume: Volume overloaded and hypertensive on admit. BP much improved, CXR looks the same from admit. Extra HD today. ?R sided pleural effusion: S/p 1.6L thoracentesis yesterday. ? Anemia: Hgb 10.1, follow. Not due for ESA yet. ? Metabolic bone disease: CorrCa still high, not on VDRA. Using low calcium bath and resume home sensipar. Phos high - on Renvela as binder. ? Nutrition:  Albumin 2.4, continue Nepro supps.   ? ?KVeneta Penton PA-C ?01/11/2022, 10:12 AM  ?[facility] ? ? ? ?

## 2022-01-11 NOTE — Progress Notes (Signed)
Pt transported to HD by transporter.  ?

## 2022-01-11 NOTE — Progress Notes (Addendum)
Pt has refused CHG. Pt educated in regards to CHG need. ?

## 2022-01-12 ENCOUNTER — Inpatient Hospital Stay (HOSPITAL_COMMUNITY): Payer: Medicare Other

## 2022-01-12 DIAGNOSIS — N186 End stage renal disease: Secondary | ICD-10-CM | POA: Diagnosis not present

## 2022-01-12 DIAGNOSIS — Z992 Dependence on renal dialysis: Secondary | ICD-10-CM | POA: Diagnosis not present

## 2022-01-12 MED ORDER — ONDANSETRON HCL 4 MG/2ML IJ SOLN
4.0000 mg | Freq: Once | INTRAMUSCULAR | Status: DC | PRN
  Filled 2022-01-12: qty 2

## 2022-01-12 NOTE — Progress Notes (Signed)
OT Cancellation Note ? ?Patient Details ?Name: Thomas Robinson ?MRN: [id number] ?DOB: 1961-08-31 ? ? ?Cancelled Treatment:    Reason Eval/Treat Not Completed: Patient declined, no reason specified OT and PT attempting to complete evaluation. Patient politely declining stating, "I am going home tomorrow, I will do what I need to do there." OT and PT offering to return but patient with no interest in acute OT or PT at this time. OT signing off.  ? ?Thomas Robinson E. Shevaun Lovan, OTR/L ?Acute Rehabilitation Services ?[phone] ?[phone]  ? ?Thomas Robinson [name] ?01/12/2022, 1:02 PM ?

## 2022-01-12 NOTE — Progress Notes (Signed)
PCCM Progress Note ? ?Chart reviewed ? ?Pleural studies reviewed with bland cell count and labs consistent with transudative effusion. ? ?Acute on chronic hypoxemic respiratory failure - on 5L ?Volume overload ?Transudative pleural effusion, right ?Acute on chronic diastolic heart failure ?ESRD on HD ?Primary team to follow-up cytology ?Fluid removal with dialysis with Nephrology ?Discussed plan with primary team. ?Pulmonary available as needed. ? ?Rodman Pickle, M.D. ?[facility] ?01/12/2022 9:24 AM  ? ?See Amion for personal pager ?For hours between 7 PM to 7 AM, please call Elink for urgent questions ? ?

## 2022-01-12 NOTE — Progress Notes (Signed)
?                                  PROGRESS NOTE                                             ?                                                                                                                     ?                                         ? ? Patient Demographics:  ? ? Thomas Robinson, is a 61 y.o. male, DOB - [birth year], HBZ:[id number] ? ?Outpatient Primary MD for the patient is [name], [place]    LOS - 3  Admit date - 01/09/2022   ? ?Chief Complaint  ?Patient presents with  ? Chest Pain  ? Shortness of Breath  ? Dizziness  ?    ? ?Brief Narrative (HPI from H&P)   61 y.o. male with medical history significant of  ESRD on MWF HD, HFpEF (EF 60-65%), chronic respiratory failure with hypoxia on 4 L O2 via [place], paroxysmal atrial fibrillation on Eliquis, recurrent right pleural effusion, COPD, history of CVA, HTN, anemia of chronic kidney disease who presented to ED with complaints of worsening shortness of breath that started after dialysis yesterday in the evening.  Apparently patient is not compliant with his outpatient dialysis procedures and does not finish his full dialysis cycle, has had multiple admissions for fluid overload due to missed dialysis.  In the ER this admission he was diagnosed with acute on chronic hypoxic respiratory failure requiring 15 L of oxygen due to fluid overload with large right-sided pleural effusion and admitted to the hospital. ? ? Subjective:  ? ?Patient in bed, appears comfortable, denies any headache, no fever, no chest pain or pressure, improved shortness of breath , no abdominal pain. No new focal weakness. ? ? Assessment  & Plan :  ? ? ?Acute on chronic respiratory failure with hypoxia (HCC) in a patient who uses 4 L nasal cannula oxygen at baseline upon admission was requiring 15 L of high flow oxygen.  Large recurrent right-sided pleural effusion, multiple missed dialysis sessions. ? ?He clearly has fluid overload and worsening  right-sided pleural effusion from multiple missed and incomplete dialysis sessions in the outpatient setting, he has been again counseled on compliance, nephrology seeing the patient and he is undergoing dialysis for fluid removal, again being numbed noncompliant even in the hospital, counseled again, he also underwent diagnostic and therapeutic right-sided thoracentesis by pulmonary critical care on 01/10/2022 with 1.5 L of transudative fluid removed, follow cytology.  Shortness of breath has improved and currently on 8 L of nasal cannula oxygen up from 4 L at home, repeat dialysis was done on 01/11/2022, shortness of breath is improved and he feels better will start titrating her oxygen down, repeat chest x-ray on 01/12/2022. ? ?2. ESRD on dialysis on HD MWF-but noncompliant with hyperkalemia  - poor compliance with dialysis and typically leaves session early, gently in fluid overload, nephrology on board.  Dialysis per nephrology, case discussed with nephrology team on 01/11/2022 ? ?3. Chest pain with elevated troponin -  Intermittent chest pain with some tingling down left arm that comes and goes.  Unremarkable EKG, left heart catheterization in December 2021 showed normal coronaries, troponin trend flat and not consistent with ACS, flat elevated troponin due to underlying ESRD and fluid overload with acute on chronic respiratory causing hypoxia and CHF.  Supportive care and monitor. ? ?4. Acute on chronic diastolic (congestive) heart failure ([place]) -  see #1 and 2 above. ? ?5. Pleural effusion, right -  see #1 above  ? ?6. Paroxysmal atrial fibrillation (HCC)  - rate controlled, keep on telemetry, Continue eliquis and cardizem ? ?7. COPD (chronic obstructive pulmonary disease) (HCC) - No signs of exacerbation on exam, Continue with breo daily and SABA prn  ? ?8. Essential hypertension - Continue cardizem, add low-dose Coreg for better control. ? ?9. History of anemia due to chronic kidney disease ?Stable, continue  to monitor  ? ?10. Decubitus ulcer - Wound care consult, appreciate  ? ?11.  Severe protein calorie malnutrition.  Placed on protein supplementation. ? ? ? ?   ? ?Condition - Extremely Guarded ? ?Family Communication  :  none present ? ?Code Status :  Full ? ?Consults  :  Renal ? ?PUD Prophylaxis :  ? ? Procedures  :    ? ?  ? ?   ? ?Disposition Plan  :   ? ?Status is: Inpatient ? ?DVT Prophylaxis  :   ? ?apixaban (ELIQUIS) tablet 2.5 mg Start: 01/09/22 2200 ?apixaban (ELIQUIS) tablet 2.5 mg  ? ? ?Lab Results  ?Component Value Date  ? PLT 147 (L) 01/11/2022  ? ? ?Diet :  ?Diet Order   ? ?       ?  Diet regular Room service appropriate? Yes with Assist; Fluid consistency: Thin; Fluid restriction: 1200 mL Fluid  Diet effective now       ?  ? ?  ?  ? ?  ?  ? ?Inpatient Medications ? ?Scheduled Meds: ? apixaban  2.5 mg Oral BID  ? carvedilol  3.125 mg Oral BID WC  ? Chlorhexidine Gluconate Cloth  6 each Topical Q0600  ? cinacalcet  60 mg Oral Q supper  ? diltiazem  300 mg Oral q morning  ? feeding supplement (NEPRO CARB STEADY)  237 mL Oral BID BM  ? fluticasone furoate-vilanterol  1 puff Inhalation Daily  ? leptospermum manuka honey  1 application. Topical Daily  ? midodrine  10 mg Oral Once  ? multivitamin  1 tablet Oral q morning  ? sevelamer carbonate  800 mg Oral TID with meals  ? ?Continuous Infusions: ? ? ?PRN Meds:.acetaminophen **OR** acetaminophen, albuterol, morphine injection, nitroGLYCERIN, ondansetron (ZOFRAN) IV, oxyCODONE ? ?Antibiotics  :   ? ?Anti-infectives (From admission, onward)  ? ? None  ? ?  ? ? ? Time Spent in minutes  30 ? ? ?Lala Lund M.D on 01/12/2022 at 8:50 AM ? ?To page go to [email or web address]  ? ?  [facility] -  Office  [phone] ? ?See all Orders from today for further details ? ? ? Objective:  ? ?Vitals:  ? 01/11/22 2309 01/12/22 0329 01/12/22 0548 01/12/22 0752  ?BP: 127/77 127/72  137/74  ?Pulse: 86 75 75   ?Resp: '19 20 15   '$ ?Temp: 98 ?F (36.7 ?C) 98.1 ?F (36.7 ?C)  98.2 ?F  (36.8 ?C)  ?TempSrc: Oral Oral  Oral  ?SpO2: 92%  99%   ?Weight:      ?Height:      ? ? ?Wt Readings from Last 3 Encounters:  ?01/11/22 49 kg  ?12/20/21 43.6 kg  ?12/10/21 44.8 kg  ? ? ? ?Intake/Output Summary (Last 24 hours) at 01/12/2022 0850 ?Last data filed at 01/12/2022 0137 ?Gross per 24 hour  ?Intake 240 ml  ?Output 982 ml  ?Net -742 ml  ? ? ? ?Physical Exam ? ?Awake Alert, No new F.N deficits, Normal affect ?[place].AT,PERRAL ?Supple Neck, No JVD,   ?Symmetrical Chest wall movement, reduced breath sounds at both bases right less than left ?RRR,No Gallops, Rubs or new Murmurs,  ?+ve B.Sounds, Abd Soft, No tenderness,   ?No Cyanosis, Clubbing or edema  ? ? ?  ? ?RN pressure injury documentation: ?Pressure Injury 12/14/21 Sacrum Medial Unstageable - Full thickness tissue loss in which the base of the injury is covered by slough (yellow, tan, gray, green or brown) and/or eschar (tan, brown or black) in the wound bed. (Active)  ?12/14/21 2130  ?Location: Sacrum  ?Location Orientation: Medial  ?Staging: Unstageable - Full thickness tissue loss in which the base of the injury is covered by slough (yellow, tan, gray, green or brown) and/or eschar (tan, brown or black) in the wound bed.  ?Wound Description (Comments):   ?Present on Admission: Yes  ?Dressing Type Foam - Lift dressing to assess site every shift 01/11/22 2013  ? ? ? Data Review:  ? ? ?CBC ?Recent Labs  ?Lab 01/09/22 ?1316 01/09/22 ?1339 01/10/22 ?0137 01/11/22 ?0119  ?WBC 6.3  --  7.7 5.9  ?HGB 11.9* 13.3 10.6* 10.1*  ?HCT 39.5 39.0 33.9* 33.2*  ?PLT 154  --  148* 147*  ?MCV 99.0  --  96.9 100.3*  ?MCH 29.8  --  30.3 30.5  ?MCHC 30.1  --  31.3 30.4  ?RDW 16.4*  --  16.2* 16.0*  ?LYMPHSABS 0.5*  --   --  0.4*  ?MONOABS 0.3  --   --  0.3  ?EOSABS 0.3  --   --  0.3  ?BASOSABS 0.1  --   --  0.1  ? ? ?Electrolytes ?Recent Labs  ?Lab 01/09/22 ?1316 01/09/22 ?1339 01/10/22 ?0137 01/11/22 ?0119  ?NA 138 136 138 136  138  ?K 6.2* 5.9* 6.2* 4.6  4.7  ?CL 98  --  99  100  100  ?CO2 23  --  '24 24  26  '$ ?GLUCOSE 115*  --  103* 104*  102*  ?BUN 53*  --  59* 35*  35*  ?CREATININE 7.99*  --  8.43* 5.30*  5.14*  ?CALCIUM 10.4*  --  9.7 9.1  9.3  ?AST 18  --   --  13*  ?ALT 12  --   -

## 2022-01-12 NOTE — Progress Notes (Signed)
Patient requested nausea medication. Paged MD for nausea medication. Went into room to give patient order nausea medication and patient is sleeping. Medication not given due to patient being asleep. Will continue to monitor patient. Racheal Patches, RN.  ?

## 2022-01-12 NOTE — Progress Notes (Signed)
PT Cancellation Note ? ?Patient Details ?Name: Thomas Robinson ?MRN: [id number] ?DOB: Apr 06, 1961 ? ? ?Cancelled Treatment:    Reason Eval/Treat Not Completed: Patient declined, no reason specified Patient declining OOB mobility and acute PT. Patient states he receives HHPT and does his own thing at home. He currently is trying to wean himself off the rollator. Patient has history of declining acute PT. Patient has refused therapy x3 this admission. PT will sign off.  ? ?Hana Trippett A. Gilford Rile, PT, DPT ?Acute Rehabilitation Services ?Pager [phone] ?Office [phone] ? ? ? ?Nissi Doffing A Jacquelene Kopecky ?01/12/2022, 12:53 PM ?

## 2022-01-12 NOTE — Progress Notes (Signed)
?[facility] ?Progress Note  ? ?Subjective:  Seen in room - remains on O2. Had extra HD yesterday, but cut his HD short - only ran 1hr with net UF only 997m. Tells me had CP after HD yesterday. Lasted about 1 hr, then resolved. Looks like had Trop drawn which was 197 - stable from his prior. CXR this AM, still with pulm edema and R effusion.  ? ?Objective ?Vitals:  ? 01/11/22 2309 01/12/22 0329 01/12/22 0548 01/12/22 0752  ?BP: 127/77 127/72  137/74  ?Pulse: 86 75 75   ?Resp: '19 20 15   '$ ?Temp: 98 ?F (36.7 ?C) 98.1 ?F (36.7 ?C)  98.2 ?F (36.8 ?C)  ?TempSrc: Oral Oral  Oral  ?SpO2: 92%  99%   ?Weight:      ?Height:      ? ?Physical Exam ?General: Cachetic appearing man, NAD. Nasal O2 in place. ?Heart: RRR; 3/6 murmur ?Lungs: CTA in upper lobes, reduced air movement in B bases ?Abdomen: soft ?Extremities: No LE edema, reduced muscle mass ?[facility] ? ?Additional Objective ?Labs: ?Basic Metabolic Panel: ?Recent Labs  ?Lab 01/09/22 ?1316 01/09/22 ?1339 01/10/22 ?0137 01/11/22 ?0119  ?NA 138 136 138 136  138  ?K 6.2* 5.9* 6.2* 4.6  4.7  ?CL 98  --  99 100  100  ?CO2 23  --  '24 24  26  '$ ?GLUCOSE 115*  --  103* 104*  102*  ?BUN 53*  --  59* 35*  35*  ?CREATININE 7.99*  --  8.43* 5.30*  5.14*  ?CALCIUM 10.4*  --  9.7 9.1  9.3  ?PHOS  --   --   --  6.4*  ? ?Liver Function Tests: ?Recent Labs  ?Lab 01/09/22 ?1316 01/11/22 ?0119  ?AST 18 13*  ?ALT 12 9  ?ALKPHOS 94 77  ?BILITOT 1.5* 1.0  ?PROT 7.4 5.9*  ?ALBUMIN 3.2* 2.4*  2.4*  ? ?CBC: ?Recent Labs  ?Lab 01/09/22 ?1316 01/09/22 ?1339 01/10/22 ?0137 01/11/22 ?0119  ?WBC 6.3  --  7.7 5.9  ?NEUTROABS 5.1  --   --  4.8  ?HGB 11.9* 13.3 10.6* 10.1*  ?HCT 39.5 39.0 33.9* 33.2*  ?MCV 99.0  --  96.9 100.3*  ?PLT 154  --  148* 147*  ? ?Blood Culture ?   ?Component Value Date/Time  ? SDES PLEURAL 01/10/2022 1336  ? [place] 01/10/2022 1336  ? CULT  01/10/2022 1336  ?  NO GROWTH < 24 HOURS ?Performed at [facility] [address], [place] [address]?  ? REPTSTATUS PENDING 01/10/2022 1336  ? ?Studies/Results: ?DG Chest Port 1 View ? ?Result Date: 01/11/2022 ?CLINICAL DATA:  Shortness of breath and COPD. EXAM: PORTABLE CHEST 1 VIEW COMPARISON:  01/10/2022 FINDINGS: Right chest wall dialysis catheter is noted with tip projecting over the cavoatrial junction. Stable cardiomediastinal contours. Moderate right pleural effusion. Mild diffuse interstitial scratch set diffuse interstitial edema pattern is unchanged. Bilateral airspace opacities are identified with worsening aeration to the right mid and right lower lung. IMPRESSION: 1. Right pleural effusion and pulmonary edema, unchanged. 2. Bilateral pulmonary opacities with worsening aeration to the right mid and right lower lung. Electronically Signed   By: TKerby MoorsM.D.   On: 01/11/2022 07:22  ? ?DG Chest Port 1 View ? ?Result Date: 01/10/2022 ?CLINICAL DATA:  Shortness of breath EXAM: PORTABLE CHEST 1 VIEW COMPARISON:  01/09/2022 FINDINGS: Tunneled right IJ dialysis catheter is again identified. Persistent but  decreased right pleural effusion and adjacent atelectasis. Persistent bilateral patchy opacities and left basilar atelectasis. Stable cardiomegaly. IMPRESSION: Persistent but decreased right pleural effusion and adjacent atelectasis. Persistent bilateral opacities favored to reflect edema. Electronically Signed   By: Macy Mis M.D.   On: 01/10/2022 14:10  ? ?ECHOCARDIOGRAM COMPLETE ? ?Result Date: 01/11/2022 ?   ECHOCARDIOGRAM REPORT   Patient Name:   Thomas Robinson Date of Exam: 01/11/2022 Medical Rec #:  [id number]        Height:       68.0 in Accession #:    [id number]       Weight:       106.5 lb Date of Birth:  1961/05/15        BSA:          1.563 m? Patient Age:    61 years         BP:           145/79 mmHg Patient Gender: M                HR:           84 bpm. Exam Location:  Inpatient Procedure: 2D Echo, Cardiac Doppler, Color Doppler and Strain Analysis  Indications:    CHF  History:        Patient has prior history of Echocardiogram examinations, most                 recent 08/12/2021. CHF, Signs/Symptoms:Shortness of Breath and                 Edema; Risk Factors:Hypertension. ESRD on dialysis.  Sonographer:    Merrie Roof RDCS Referring Phys: [id number] [place]  1. Hyperdynamic LV, intracavitary gradient up to 25 mmHG. Severe concentric LVH. Apical sparing noted on strain imaging. Would consider PYP scan to exclude cardiac amyloidosis. Left ventricular ejection fraction, by estimation, is 65 to 70%. The left ventricle has normal function. Left ventricular endocardial border not optimally defined to evaluate regional wall motion. There is severe concentric left ventricular hypertrophy. Left ventricular diastolic function could not be evaluated.  2. Concerns for severe calcific mitral stenosis. MG 10 mmHG @ 80 bpm. MVA by VTI inaccurate due to moderate AI influencing SV assessment. Would consider TEE for clarification. The mitral valve is degenerative. Trivial mitral valve regurgitation. Severe mitral stenosis. The mean mitral valve gradient is 10.0 mmHg with average heart rate of 80 bpm. Moderate to severe mitral annular calcification.  3. Calcific aortic valve. Mild to moderate aortic stenosis. Moderate aortic regurgitation. The aortic valve is tricuspid. There is moderate calcification of the aortic valve. There is moderate thickening of the aortic valve. Aortic valve regurgitation is moderate. Mild to moderate aortic valve stenosis. Aortic valve area, by VTI measures 1.46 cm?[name] Aortic valve mean gradient measures 19.0 mmHg. Aortic valve Vmax measures 2.91 m/s.  4. Right ventricular systolic function is mildly reduced. The right ventricular size is normal. There is moderately elevated pulmonary artery systolic pressure. The estimated right ventricular systolic pressure is 00.9 mmHg.  5. Left atrial size was severely dilated.  6. Right atrial size  was moderately dilated.  7. A small pericardial effusion is present. The pericardial effusion is circumferential. There is no evidence of cardiac tamponade.  8. There is moderate dilatation of the ascending aorta, measuring 43 mm.  9. The inferior vena cava is normal in size with greater than 50% respiratory variability, suggesting right atrial pressure of 3 mmHg.  Comparison(s): Changes from prior study are noted. Concerns for cardiac amyloidosis. Concerns for severe calcific MS. FINDINGS  Left Ventricle: Hyperdynamic LV, intracavitary gradient up to 25 mmHG. Severe concentric LVH. Apical sparing noted on strain imaging. Would consider PYP scan to exclude cardiac amyloidosis. Left ventricular ejection fraction, by estimation, is 65 to 70%. The left ventricle has normal function. Left ventricular endocardial border not optimally defined to evaluate regional wall motion. The left ventricular internal cavity size was normal in size. There is severe concentric left ventricular hypertrophy. Left ventricular diastolic function could not be evaluated due to mitral annular calcification (moderate or greater). Left ventricular diastolic function could not be evaluated. Right Ventricle: The right ventricular size is normal. No increase in right ventricular wall thickness. Right ventricular systolic function is mildly reduced. There is moderately elevated pulmonary artery systolic pressure. The tricuspid regurgitant velocity is 3.39 m/s, and with an assumed right atrial pressure of 3 mmHg, the estimated right ventricular systolic pressure is 03.4 mmHg. Left Atrium: Left atrial size was severely dilated. Right Atrium: Right atrial size was moderately dilated. Pericardium: A small pericardial effusion is present. The pericardial effusion is circumferential. There is no evidence of cardiac tamponade. Mitral Valve: Concerns for severe calcific mitral stenosis. MG 10 mmHG @ 80 bpm. MVA by VTI inaccurate due to moderate AI  influencing SV assessment. Would consider TEE for clarification. The mitral valve is degenerative in appearance. Moderate to severe mitral annular calcification. Trivial mitral valve regurgitation. Severe mitral valve sten

## 2022-01-13 ENCOUNTER — Inpatient Hospital Stay (HOSPITAL_COMMUNITY): Payer: Medicare Other

## 2022-01-13 DIAGNOSIS — I342 Nonrheumatic mitral (valve) stenosis: Secondary | ICD-10-CM

## 2022-01-13 DIAGNOSIS — J449 Chronic obstructive pulmonary disease, unspecified: Secondary | ICD-10-CM

## 2022-01-13 DIAGNOSIS — I5033 Acute on chronic diastolic (congestive) heart failure: Secondary | ICD-10-CM

## 2022-01-13 DIAGNOSIS — I351 Nonrheumatic aortic (valve) insufficiency: Secondary | ICD-10-CM

## 2022-01-13 LAB — RENAL FUNCTION PANEL
Albumin: 2.4 g/dL — ABNORMAL LOW (ref 3.5–5.0)
Anion gap: 13 (ref 5–15)
BUN: 54 mg/dL — ABNORMAL HIGH (ref 8–23)
CO2: 24 mmol/L (ref 22–32)
Calcium: 8.6 mg/dL — ABNORMAL LOW (ref 8.9–10.3)
Chloride: 99 mmol/L (ref 98–111)
Creatinine, Ser: 6.74 mg/dL — ABNORMAL HIGH (ref 0.61–1.24)
GFR, Estimated: 9 mL/min — ABNORMAL LOW (ref >60)
Glucose, Bld: 111 mg/dL — ABNORMAL HIGH (ref 70–99)
Phosphorus: 6.7 mg/dL — ABNORMAL HIGH (ref 2.5–4.6)
Potassium: 5.4 mmol/L — ABNORMAL HIGH (ref 3.5–5.1)
Sodium: 136 mmol/L (ref 135–145)

## 2022-01-13 LAB — BODY FLUID CULTURE W GRAM STAIN: Culture: NO GROWTH

## 2022-01-13 LAB — CYTOLOGY - NON PAP

## 2022-01-13 LAB — HEPATITIS B CORE ANTIBODY, IGM: Hep B C IgM: NONREACTIVE

## 2022-01-13 LAB — HEPATITIS B SURFACE ANTIGEN: Hepatitis B Surface Ag: NONREACTIVE

## 2022-01-13 MED ORDER — METOPROLOL SUCCINATE ER 50 MG PO TB24
50.0000 mg | ORAL_TABLET | ORAL | Status: DC
  Administered 2022-01-13: 50 mg via ORAL
  Filled 2022-01-13: qty 1

## 2022-01-13 MED ORDER — ALPRAZOLAM 0.5 MG PO TABS
0.2500 mg | ORAL_TABLET | Freq: Once | ORAL | Status: AC
Start: 2022-01-13 — End: 2022-01-13
  Administered 2022-01-13: 0.25 mg via ORAL
  Filled 2022-01-13: qty 1

## 2022-01-13 MED ORDER — METOPROLOL SUCCINATE ER 50 MG PO TB24: 50.0000 mg | ORAL_TABLET | Freq: Every day | ORAL | Status: DC

## 2022-01-13 MED ORDER — HEPARIN SODIUM (PORCINE) 1000 UNIT/ML IJ SOLN
INTRAMUSCULAR | Status: AC
  Filled 2022-01-13: qty 4

## 2022-01-13 NOTE — Discharge Instructions (Signed)
Follow with Primary MD [name], PA in 7 days  ? ?Get CBC, CMP, 2 view Chest X ray -  checked next visit within 1 week by Primary MD   ? ?Activity: As tolerated with Full fall precautions use walker/cane & assistance as needed ? ?Disposition Home   ? ?Diet: Renal diet with strict 1.2 L fluid restriction per day. ? ?Special Instructions: If you have smoked or chewed Tobacco  in the last 2 yrs please stop smoking, stop any regular Alcohol  and or any Recreational drug use. ? ?On your next visit with your primary care physician please Get Medicines reviewed and adjusted. ? ?Please request your Prim.MD to go over all Hospital Tests and Procedure/Radiological results at the follow up, please get all Hospital records sent to your Prim MD by signing hospital release before you go home. ? ?If you experience worsening of your admission symptoms, develop shortness of breath, life threatening emergency, suicidal or homicidal thoughts you must seek medical attention immediately by calling 911 or calling your MD immediately  if symptoms less severe. ? ?You Must read complete instructions/literature along with all the possible adverse reactions/side effects for all the Medicines you take and that have been prescribed to you. Take any new Medicines after you have completely understood and accpet all the possible adverse reactions/side effects.  ? ?  ?

## 2022-01-13 NOTE — Progress Notes (Signed)
Patient refused AM labs. RN tried to talk to patient about the importance of having labs performed. Patient became angry stating, "I am tired of getting woke up. I don't want anyone else in my room tonight. Get out." RN will continue to monitor patient. No current complaints and VSS. ?

## 2022-01-13 NOTE — Progress Notes (Signed)
Upon assessment of patient, this RN noticed patient's IV laying on bedside table. When this RN inquired about what happened to patient's IV, he stated "it hasn't been used in over 24 hours and I don't want it in my arm." Patient educated on the importance of having an IV while inpatient but he declines to have a new IV placed. MD notified of patient's refusal. Will continue to monitor. ?

## 2022-01-13 NOTE — Plan of Care (Signed)
Post HD chest pain again, ? If ++MS  is causing poor perfusion post HD, Cards input. Hold DC. ?

## 2022-01-13 NOTE — TOC Initial Note (Signed)
Transition of Care [facility]) - Initial/Assessment Note    Patient Details  Name: Thomas Robinson MRN: [id number] Date of Birth: 12/28/1960  Transition of Care [facility]) CM/SW Contact:    Harriet Masson, RN Phone Number: 01/13/2022, 3:08 PM  Clinical Narrative:       Spoke to patient regarding transition needs. Patient has used Advertising copywriter for [facility] needs in the past. Amy with [facility] accepted referral for PT/RN. Patient currently has 3L 02 at home with [employer]. Will need new ambulatory sat if patient goes home on 02 above 3L. Patient states friend can transport home when discharged.            Expected Discharge Plan: Home w Home Health Services Barriers to Discharge: Barriers Resolved   Patient Goals and CMS Choice Patient states their goals for this hospitalization and ongoing recovery are:: n home CMS Medicare.gov Compare Post Acute Care list provided to:: Patient Choice offered to / list presented to : Patient  Expected Discharge Plan and Services Expected Discharge Plan: Home w Home Health Services     Post Acute Care Choice: Resumption of Svcs/PTA Provider Living arrangements for the past 2 months: Apartment Expected Discharge Date: 01/13/22                         HH Arranged: RN, PT HH Agency: [facility] Date [facility] Agency Contacted: 01/13/22 Time HH Agency Contacted: [phone] Representative spoke with at [facility] Agency: Amy  Prior Living Arrangements/Services Living arrangements for the past 2 months: Apartment Lives with:: Roommate Patient language and need for interpreter reviewed:: Yes Do you feel safe going back to the place where you live?: Yes      Need for Family Participation in Patient Care: Yes (Comment) Care giver support system in place?: Yes (comment)   Criminal Activity/Legal Involvement Pertinent to Current Situation/Hospitalization: No - Comment as needed  Activities of Daily Living Home Assistive Devices/Equipment: Walker (specify type), Oxygen (front  wheeled walker) ADL Screening (condition at time of admission) Patient's cognitive ability adequate to safely complete daily activities?: Yes Is the patient deaf or have difficulty hearing?: No Does the patient have difficulty seeing, even when wearing glasses/contacts?: No Does the patient have difficulty concentrating, remembering, or making decisions?: No Patient able to express need for assistance with ADLs?: Yes Does the patient have difficulty dressing or bathing?: Yes Independently performs ADLs?: No Communication: Independent Dressing (OT): Needs assistance Is this a change from baseline?: Change from baseline, expected to last <3days Grooming: Needs assistance Is this a change from baseline?: Change from baseline, expected to last <3 days Feeding: Independent Bathing: Needs assistance Is this a change from baseline?: Change from baseline, expected to last <3 days Toileting: Needs assistance Is this a change from baseline?: Change from baseline, expected to last <3 days In/Out Bed: Needs assistance Is this a change from baseline?: Change from baseline, expected to last <3 days Walks in Home: Needs assistance (walker with front wheels) Is this a change from baseline?: Pre-admission baseline Does the patient have difficulty walking or climbing stairs?: Yes (front wheeled walker) Weakness of Legs: Both Weakness of Arms/Hands: Both  Permission Sought/Granted Permission sought to share information with : Case Manager       Permission granted to share info w AGENCY: hh        Emotional Assessment   Attitude/Demeanor/Rapport: Engaged Affect (typically observed): Accepting Orientation: : Oriented to Self, Oriented to Place, Oriented to Situation, Oriented to  Time  Alcohol / Substance Use: Not Applicable Psych Involvement: No (comment)  Admission diagnosis:  Acute on chronic respiratory failure with hypoxia (HCC) [J96.21] Patient Active Problem List   Diagnosis Date Noted    Acute on chronic respiratory failure with hypoxia (HCC) 01/09/2022   History of anemia due to chronic kidney disease 01/09/2022   Decubitus ulcer 01/09/2022   Hyperkalemia 12/14/2021   COPD (chronic obstructive pulmonary disease) (HCC)    Protein-calorie malnutrition, severe (HCC) 11/20/2021   Depression with anxiety 11/15/2021   Pleural effusion, bilateral 10/15/2021   Pleural effusion, right 10/14/2021   Paroxysmal atrial fibrillation (HCC) 09/29/2021   Acute on chronic respiratory failure with hypoxemia due to volume overload due to noncompliance with hemodialysis  09/19/2021   Persistent atrial fibrillation (HCC) 09/19/2021   Pulmonary emboli (HCC) 09/19/2021   Cachexia (HCC) 09/19/2021   Chronic respiratory failure with hypoxia (HCC) 09/07/2021   Fever 09/07/2021   Acute on chronic diastolic (congestive) heart failure (HCC) 08/26/2021   Fluid overload 08/25/2021   Atrial fibrillation with RVR (HCC) 08/25/2021   Sacral pressure sore 08/12/2021   Balanitis 07/16/2021   Gastritis and gastroduodenitis    Grade III hemorrhoids    Decubitus ulcer of coccyx, stage 2 (HCC) 07/08/2021   Prolonged QT interval 07/08/2021   Hematochezia    Atrial flutter (HCC)    Pressure injury of skin 07/02/2021   Abnormal chest CT 06/30/2021   Recurrent pleural effusion on right 06/30/2021   Age-related physical debility 09/20/2020   Allergy, unspecified, initial encounter 09/20/2020   Nausea 09/20/2020   Pain, unspecified 09/20/2020   Pruritus, unspecified 09/20/2020   S/P cardiac cath 08/27/2020   Acute encephalopathy 08/26/2020   Peripheral neuropathy 08/26/2020   Elevated troponin 08/25/2020   Arthritis    Asthma    Cataract    Cerebral infarction due to thrombosis of cerebral artery (HCC)    History of CVA (cerebrovascular accident)    Dyspnea    Hyperlipidemia    Hypertension    Restless leg syndrome    Degenerative lumbar spinal stenosis 06/26/2020   Diastolic dysfunction  06/22/2020   Renovascular hypertension 06/22/2020   Myofascial pain syndrome 06/12/2020   History of fusion of cervical spine 03/26/2020   Pulmonary edema 01/23/2020   Cervical disc herniation 01/04/2020   Chronic low back pain 11/24/2019   Degeneration of lumbar intervertebral disc 11/24/2019   Anaphylactic shock, unspecified, sequela 06/10/2019   Rib fractures 06/05/2019   Fall at home, initial encounter 06/04/2019   Right rib fracture 06/04/2019   GERD without esophagitis 06/04/2019   Fall 06/04/2019   Thoracic ascending aortic aneurysm 06/04/2019   GERD (gastroesophageal reflux disease) 06/04/2019   Carpal tunnel syndrome of right wrist 10/05/2018   Ulnar neuropathy 10/05/2018   Neuritis of right ulnar nerve 09/13/2018   Pain in joint of right elbow 09/13/2018   Chest pain with elevated troponin  04/13/2018   Atypical chest pain 02/16/2018   ESRD on dialysis on HD MWF-but noncompliant with hyperkalemia  02/16/2018   End stage renal disease (HCC) 02/16/2018   Asthma, chronic, unspecified asthma severity, with acute exacerbation 10/23/2017   Respiratory failure, acute (HCC) 10/23/2017   Pulmonary hypertension (HCC) 10/23/2017   Iron deficiency anemia, unspecified 09/09/2017   Gram-negative sepsis, unspecified (HCC) 09/05/2017   Moderate protein-calorie malnutrition (HCC) 06/06/2017   Recurrent right pleural effusion 06/06/2017   Coagulation defect, unspecified (HCC) 06/04/2017   Other hyperlipidemia 06/04/2017   Secondary hyperparathyroidism of renal origin (HCC) 06/04/2017   Macrocytic anemia  05/24/2017   Metabolic acidosis 05/24/2017   Hypertensive urgency 05/24/2017   GI bleeding 05/24/2017   Anemia of chronic disease 05/24/2017   Stroke (HCC) 2018   History of completed stroke    Essential hypertension 04/09/2015   PCP:  Maud Deed, PA Pharmacy:   [id number] [place], [place] - [phone] [place] [address] Phone: [phone] Fax:  [phone]     Social Determinants of Health (SDOH) Interventions    Readmission Risk Interventions    12/16/2021    4:58 PM 11/20/2021   11:09 AM 09/08/2021    2:46 PM  Readmission Risk Prevention Plan  Transportation Screening Complete Complete Complete  Medication Review (RN Care Manager) Complete Complete Complete  PCP or Specialist appointment within 3-5 days of discharge Complete Complete   HRI or Home Care Consult Complete Complete Complete  SW Recovery Care/Counseling Consult Complete Complete Complete  Palliative Care Screening Not Applicable Complete Not Applicable  Skilled Nursing Facility Not Applicable Not Applicable Not Applicable

## 2022-01-13 NOTE — Progress Notes (Addendum)
?Thomas Robinson ?Progress Note  ? ?Subjective:   Patient seen and examined at bedside in dialysis.  Reports anxiety this AM with HD, give 1 time dose 0.'25mg'$  Xanax.   ? ?Objective ?Vitals:  ? 01/13/22 0800 01/13/22 0841 01/13/22 1029 01/13/22 1046  ?BP: (!) 160/83  (!) 142/80 (!) 147/81  ?Pulse: 80  86 75  ?Resp: (!) 25  (!) 27 (!) 23  ?Temp:   97.8 ?F (36.6 ?C)   ?TempSrc:   Oral   ?SpO2: (!) 84% 96% 92% 91%  ?Weight:   52.5 kg   ?Height:      ? ?Physical Exam ?General:chronically ill, cachetic appearing male in NAD ?Heart:RRR, 3/85mg ?Lungs:CTAB, nml WOB on 5L O2 via [place] ?Abdomen:soft, NTND ?Extremities:no LE edema ?[facility]  ? ?Filed Weights  ? 01/11/22 1334 01/13/22 0345 01/13/22 1029  ?Weight: 49 kg 46.4 kg 52.5 kg  ? ?No intake or output data in the 24 hours ending 01/13/22 1128 ? ?Additional Objective ?Labs: ?Basic Metabolic Panel: ?Recent Labs  ?Lab 01/09/22 ?1316 01/09/22 ?1339 01/10/22 ?0137 01/11/22 ?0119  ?NA 138 136 138 136  138  ?K 6.2* 5.9* 6.2* 4.6  4.7  ?CL 98  --  99 100  100  ?CO2 23  --  '24 24  26  '$ ?GLUCOSE 115*  --  103* 104*  102*  ?BUN 53*  --  59* 35*  35*  ?CREATININE 7.99*  --  8.43* 5.30*  5.14*  ?CALCIUM 10.4*  --  9.7 9.1  9.3  ?PHOS  --   --   --  6.4*  ? ?Liver Function Tests: ?Recent Labs  ?Lab 01/09/22 ?1316 01/11/22 ?0119  ?AST 18 13*  ?ALT 12 9  ?ALKPHOS 94 77  ?BILITOT 1.5* 1.0  ?PROT 7.4 5.9*  ?ALBUMIN 3.2* 2.4*  2.4*  ? ? ?CBC: ?Recent Labs  ?Lab 01/09/22 ?1316 01/09/22 ?1339 01/10/22 ?0137 01/11/22 ?0119  ?WBC 6.3  --  7.7 5.9  ?NEUTROABS 5.1  --   --  4.8  ?HGB 11.9* 13.3 10.6* 10.1*  ?HCT 39.5 39.0 33.9* 33.2*  ?MCV 99.0  --  96.9 100.3*  ?PLT 154  --  148* 147*  ? ? ?Lab Results  ?Component Value Date  ? INR 1.2 12/10/2021  ? INR 1.3 (H) 09/20/2021  ? INR 1.2 08/12/2021  ? ?Studies/Results: ?DG Chest Port 1 View ? ?Result Date: 01/12/2022 ?CLINICAL DATA:  Acute on chronic respiratory failure. Shortness of breath. EXAM: PORTABLE CHEST 1  VIEW COMPARISON:  01/11/22 FINDINGS: Right chest wall dialysis catheter noted with tips at the cavoatrial junction. Stable cardiomediastinal contours. Aortic atherosclerosis. Unchanged moderate right pleural effusion. Diffuse pulmonary edema pattern is also unchanged in the interval. Atelectasis is noted within both lung bases. IMPRESSION: 1. No change in right pleural effusion and pulmonary edema. 2. Bibasilar atelectasis. Electronically Signed   By: TKerby MoorsM.D.   On: 01/12/2022 09:28   ? ?Medications: ? ? ALPRAZolam  0.25 mg Oral Once  ? apixaban  2.5 mg Oral BID  ? carvedilol  3.125 mg Oral BID WC  ? Chlorhexidine Gluconate Cloth  6 each Topical Q0600  ? cinacalcet  60 mg Oral Q supper  ? diltiazem  300 mg Oral q morning  ? feeding supplement (NEPRO CARB STEADY)  237 mL Oral BID BM  ? fluticasone furoate-vilanterol  1 puff Inhalation Daily  ? leptospermum manuka honey  1 application. Topical Daily  ? midodrine  10 mg Oral Once  ?  multivitamin  1 tablet Oral q morning  ? sevelamer carbonate  800 mg Oral TID with meals  ? ? ?Dialysis Orders: ?Center: [facility]  on MWF . ?3:30hr, BFR 400 DFR 800, EDW 44kg, 2K 2Ca, TDC, AVF maturing, No heparin ?- Mircera 277mg IV q 2 weeks - last dose 01/08/22 ?   ?Assessment/Plan: ?Chest pain: Volume overload is likely contributing to demand ischemia. Recurrence overnight, resolved this AM. Per primary. ?ESRD: Usual MWF schedule but shortens every treatment to 1-1.5 hours because he worries he will have chest pain. Discussed dangers, he completed 3hr HD 5/12. Agreed for "extra" HD 5/13 -> but then cut his treatment after 1hr. Compliance has been discussed repeatedly. On HD today.  Not sure we are accomplishing anything more fluid/dialysis wise with him being inpatient that we could as outpatient. ?Hyperkalemia: Due to insufficient HD, improved s/p HD. ? Hypertension/volume: Volume overloaded and hypertensive on admit. BP much improved, CXR with minimal improvement from admit. ?R  sided pleural effusion: S/p 1.6L thoracentesis 5/13, Cx pending. Cell count ok, consistent with transudative effusion.  ? Anemia: Hgb 10.1, follow. Not due for ESA yet. ? Metabolic bone disease: CorrCa still high, not on VDRA. Using low calcium bath and resume home sensipar. Phos high - on Renvela as binder. ? Nutrition:  Albumin 2.4, continue Nepro supps, adding Pro-source too ? ?LJen Mow PA-C ?CMorrisonKidney Robinson ?01/13/2022,11:28 AM ? LOS: 4 days  ? ?RKelly Splinter MD ?01/13/2022, 5:04 PM ? ? ? ? ?

## 2022-01-13 NOTE — Consult Note (Signed)
?Cardiology Consultation:  ? ?Patient ID: Thomas Robinson ?MRN: [id number]; DOB: Apr 09, 1961 ? ?Admit date: 01/09/2022 ?Date of Consult: 01/13/2022 ? ?PCP:  [name], PA ?  ?[facility] Providers ?Cardiologist:  [name], DO  ? ?Patient Profile:  ? ?Thomas Robinson is a 61 y.o. male with a history of normal coronaries on cardiac catheterization in 33/3545, chronic diastolic CHF,  paroxysmal atrial fibrillation/flutter on Eliquis, PEA arrest in 07/2021 after sedation for AV fistula procedure, ascending thoracic aortic aneurysm,  CVA, COPD with chronic respiratory failure, recurrent right pleural effusions s/p multiple thoracentesis, severe pulmonary arterial hypertension, prior hypertension but now hypotension requiring Midodrine, hyperlipidemia, ESRD on hemodialysis on T/Th/Sat, prior GI bleed with iron deficiency anemia, chronic low back pain, and non-compliance who is being seen for evaluation of chest pain at the request of Dr. Candiss Norse.  ? ?History of Present Illness:  ? ?Thomas Robinson is a 61 year old male with the above history who is followed by Dr. Harriet Masson.  He has a complex history as noted above.  He has been followed by Dr. Harriet Masson in the past for fluid overload in the context of ESRD.  Unfortunately, patient has been noncompliant with hemodialysis and establishing with nephrology.  He has had numerous admissions for various medical issues but primarily hypoxic respiratory failure in the context of skipped/incomplete hemodialysis.  He left AMA multiple times. Cardiac catheterization at [facility] in 08/2020 showed normal coronaries. He was initially diagnosed with atrial flutter in 62/5638 which was complicated by hematochezia and GI bleed.  He suffered a PEA arrest after sensation for AV fistula revision surgery in 07/2021.  Patient has been felt to be a poor candidate for any future invasive procedures due to inability to safely tolerate sedation without recurrent cardiac arrest.  ? ?Patient was recently  admitted from 12/14/2021 to 12/20/2021 acute on chronic hypoxic respiratory failure secondary to acute on chronic diastolic CHF again in the setting of non-compliance with dialysis. He has a long history of signing off early at hemodialysis sessions. He was also noted to have an increasing right pleural effusion. He wad dialyzed and underwent thoracentesis. This was his 5th hospitalization this years with volume overload in setting of non-compliance. Patient has been seen by Palliative Care but has requested to remain full code. ? ?He presented back to the ED on 01/09/2022 with chest pain that started after dialysis as well as shortness of breath. Upon arrival to the ED, EKG showed no acute ischemic changes compared to prior tracings. High-sensitivity troponin mildly elevated and flat at 198 >> 189 >> 194. BNP markedly elevated at 4,241. Chest x-ray showed moderately large right pleural effusion increased in size from prior imaging and stable pulmonary vascular congestion. WBC 6.3, Hgb 11.9, Plts 154. Na 138, K 6.2, Glucose 115, BUN 53, Cr 7.99. Albumin 3.2, AST 18, ALT 12, Alk Phos 94, Total Bili 1.5. He was admitted and dialyzed. He underwent repeat thoracentesis on 01/10/2022 with removal of 1.5 L of transudative fluid. Echo on 01/11/2022 showed hyperdynamic LV with LVEF of 65-70% with severe LVH and intracavitary gradient up to 25 mmHg, normal RV with mildly reduced RV systolic function, severe mitral stenosis and moderate AI, mild to moderate AS and moderate AI, moderately elevated PASP of 49.0 mmHg, and moderate dilatation of the ascending aorta measuring 43 mm. Patient was initially going to be discharged today but had recurrent chest pain with dialysis today. Therefore, Cardiology consulted. ? ?At the time of this evaluation, patient is resting  comfortably in no acute distress. He reports he has been intermittent left sided chest pain with occasional radiation to his neck and left arm with dialysis during the  last couple of week. He states this is why he has not been completing sessions of dialysis. Although per chart review, non-compliance with dialysis predates this. He also reports intermittent chest pain at rest outside of dialysis. Sometimes these chest pain episodes are associated with anxiety but not always. He has chronic dyspnea and states he weaned himself down to 1.5L of O2 at home but per chart review looks like he should be on 4L at home. He initially required HFNC. He notes improvement in his shortness of breath during admission up until this afternoon when he felt his breathing start to worsen again after dialysis. He is currently on 4L and he asked me to bump him up to 6L. He has chronic orthopnea which is stable. He notes occasional PND. No lower extremity edema. He reports often being aware of his heart beating "hard" but no "heart racing." He states he has intermittent syncope that occurs at rest but the more he talks about this the more it sounds like he just falls asleep. No concerning arrhythmias noted on telemetry. No recent fevers or illness. He reports intermittent nasal congestion and nausea. No abnormal bleeding in urine or stools. He is not compliant with Eliquis at home and when asked him he is taking this states I think I ran out. He seem to have very poor insight into the severity of his medical condition. ? ?Past Medical History:  ?Diagnosis Date  ? Anaphylactic shock, unspecified, sequela 06/10/2019  ? Aortic insufficiency   ? Aortic stenosis   ? Arthritis   ? Asthma, chronic, unspecified asthma severity, with acute exacerbation 10/23/2017  ? CAP (community acquired pneumonia) 10/23/2017  ? Carpal tunnel syndrome of right wrist 10/05/2018  ? Cataract   ? right - removed by surgery  ? Cerebral infarction due to thrombosis of cerebral artery ([place])   ? Cervical disc herniation 01/04/2020  ? Chronic diastolic heart failure ([place]) 04/13/2018  ? Chronic low back pain 11/24/2019  ? Constipation    ? COPD (chronic obstructive pulmonary disease) ([place])   ? Cough   ? chronic cough  ? Encounter for immunization 07/08/2017  ? ESRD on hemodialysis ([place]) 02/16/2018  ? Fall 06/04/2019  ? GERD (gastroesophageal reflux disease) 06/04/2019  ? GI bleeding 05/24/2017  ? Gram-negative sepsis, unspecified ([place]) 09/05/2017  ? History of fusion of cervical spine 03/26/2020  ? Hyperlipidemia   ? Hypertension   ? Hypotension   ? Iron deficiency anemia, unspecified 09/09/2017  ? Left shoulder pain 11/11/2019  ? Lumbar radiculopathy 11/24/2019  ? Lung contusion 06/04/2019  ? LVH (left ventricular hypertrophy) due to hypertensive disease, with heart failure ([place]) 06/18/2020  ? Macrocytic anemia 05/24/2017  ? Mitral regurgitation   ? Moderate protein-calorie malnutrition ([place]) 06/06/2017  ? Myofascial pain syndrome 06/12/2020  ? Neuritis of right ulnar nerve 09/13/2018  ? Non-compliance with renal dialysis 02/08/2020  ? Oxygen deficiency 12/30/2019  ? O2 sats on RA 87% at PAT appt   ? Pain in joint of right elbow 09/13/2018  ? Paroxysmal atrial flutter ([place])   ? PEA (Pulseless electrical activity) ([place])   ? after sedation in 07/2021  ? Pulmonary hypertension ([place])   ? Renovascular hypertension 06/22/2020  ? Restless leg syndrome   ? Rib fractures 06/2019  ? Right  ? S/P cardiac cath 08/27/2020  ?  normal coronary arteries.  ? Secondary hyperparathyroidism of renal origin ([place]) 06/04/2017  ? Thoracic ascending aortic aneurysm ([place])   ? Tricuspid regurgitation   ? Ulnar neuropathy 10/05/2018  ? ? ?Past Surgical History:  ?Procedure Laterality Date  ? A/V FISTULAGRAM N/A 01/01/2021  ? Procedure: A/V FISTULAGRAM;  Surgeon: Serafina Mitchell, MD;  Location: [facility];  Service: Cardiovascular;  Laterality: N/A;  ? A/V FISTULAGRAM N/A 01/15/2021  ? Procedure: A/V FISTULAGRAM;  Surgeon: Serafina Mitchell, MD;  Location: [facility];  Service: Cardiovascular;  Laterality: N/A;  ? ANTERIOR CERVICAL DECOMP/DISCECTOMY FUSION N/A  01/04/2020  ? Procedure: ANTERIOR CERVICAL DECOMPRESSION/DISCECTOMY FUSION CERVICAL FIVE THROUGH SEVEN;  Surgeon: Melina Schools, MD;  Location: [facility];  Service: Orthopedics;  Laterality: N/A;  3 hrs  ? AV FIS

## 2022-01-13 NOTE — Discharge Summary (Addendum)
?                                                                                ? ?Thomas Robinson YBO:[id number] DOB: 16-May-1961 DOA: 01/09/2022 ? ?PCP: [name], PA ? ?Admit date: 01/09/2022  Discharge date: 01/14/2022 ? ?Admitted From: Home   Disposition:  Home ? ? ?Recommendations for Outpatient Follow-up:  ? ?Follow up with PCP in 1-2 weeks ? ?PCP Please obtain BMP/CBC, 2 view CXR in 1week,  (see Discharge instructions)  ? ?PCP Please follow up on the following pending results: Monitor pleural cytology results which are pending, needs outpatient pulmonary follow-up within 7 to 10 days of discharge. ? ? ?Home Health: PT, RN if he qualifies   ?Equipment/Devices: as below  ?Consultations: PCCM, Renal ?Discharge Condition: Stable    ?CODE STATUS: Full    ?Diet Recommendation: Renal diet with 1.2 L fluid restriction. ? ? ?Chief Complaint  ?Patient presents with  ? Chest Pain  ? Shortness of Breath  ? Dizziness  ?  ? ?Brief history of present illness from the day of admission and additional interim summary   ? ? 61 y.o. male with medical history significant of  ESRD on MWF HD, HFpEF (EF 60-65%), chronic respiratory failure with hypoxia on 4 L O2 via [place], paroxysmal atrial fibrillation on Eliquis, recurrent right pleural effusion, COPD, history of CVA, HTN, anemia of chronic kidney disease who presented to ED with complaints of worsening shortness of breath that started after dialysis yesterday in the evening.  Apparently patient is not compliant with his outpatient dialysis procedures and does not finish his full dialysis cycle, has had multiple admissions for fluid overload due to missed dialysis.  In the ER this admission he was diagnosed with acute on chronic hypoxic respiratory failure requiring 15 L of oxygen due to fluid overload with large right-sided pleural effusion and admitted to the hospital. ? ?                                                                Hospital Course  ? ? ?Acute on chronic respiratory failure with hypoxia (HCC) in a patient who uses 4 L nasal cannula oxygen at baseline upon admission was requiring 15 L of high flow oxygen.  Large recurrent right-sided pleural effusion, multiple missed dialysis sessions. ?  ?He clearly has fluid overload and worsening right-sided pleural effusion from multiple missed and incomplete dialysis sessions in the outpatient setting, he has been again counseled on compliance, nephrology seeing the patient and he is undergoing dialysis for fluid removal, again being numbed noncompliant even in the hospital, counseled again, he also underwent diagnostic and therapeutic right-sided thoracentesis by pulmonary critical care on 01/10/2022 with 1.5 L of transudative fluid removed, request PCP to kindly follow pleural fluid cytology results which are pending.  Shortness of breath has improved and currently on 4 L of nasal cannula oxygen up from 4 L at home, get a repeat dialysis  session today thereafter will be discharged, strictly counseled to be compliant with his outpatient dialysis regimen, one-point fluid restriction per day and to follow-up with PCP and pulmonary within 7 to 10 days of discharge. ?  ?2. ESRD on dialysis on HD MWF-but noncompliant with hyperkalemia  - poor compliance with dialysis and typically leaves session early, gently in fluid overload, nephrology on board.  Dialysis per nephrology, case discussed with nephrology team on 01/13/2022 ?  ?3. Chest pain with elevated troponin -  Intermittent chest pain with some tingling down left arm that comes and goes. Unremarkable EKG, left heart catheterization in December 2021 showed normal coronaries, troponin trend flat and not consistent with ACS, flat elevated troponin due to underlying ESRD and fluid overload with acute on chronic respiratory causing hypoxia and CHF.  Was seen by cardiology this admission, he is  evidence of moderate AS and moderate to severe MS on his echocardiogram, per cardiology continue medical treatment not a candidate for invasive intervention.  Patient is extremely noncompliant with medical treatment and in extremely frail shape at baseline, long-term prognosis is poor, for now does not want to consider palliative care which was recommended by cardiology.  Continue home dose beta-blocker for now.  Currently chest pain-free. ? ? ?4. Acute on chronic diastolic (congestive) heart failure (HCC) -  see #1 and 2 above. ?  ?5. Pleural effusion, right -  see #1 above  ?  ?6. Paroxysmal atrial fibrillation (HCC)  - rate controlled, keep on telemetry, Continue eliquis and cardizem and beta-blocker combination. ?  ?7. COPD (chronic obstructive pulmonary disease) (HCC) - No signs of exacerbation on exam, Continue with breo daily and SABA prn, outpatient pulmonary follow-up postdischarge. ?  ?8. Essential hypertension - continue home regimen. ?  ?9. History of anemia due to chronic kidney disease - Stable, continue to monitor  ?  ?10. Decubitus ulcer - Wound care consult, appreciated, PCP to monitor.  Home RN if he qualifies. ?  ?11. Severe protein calorie malnutrition.  Placed on protein supplementation here. ? ?Discharge diagnosis   ? ? ?Principal Problem: ?  Acute on chronic respiratory failure with hypoxia ([place]) ?Active Problems: ?  ESRD on dialysis on HD MWF-but noncompliant with hyperkalemia  ?  Chest pain with elevated troponin  ?  Acute on chronic diastolic (congestive) heart failure ([place]) ?  Pleural effusion, right ?  Paroxysmal atrial fibrillation (HCC) ?  COPD (chronic obstructive pulmonary disease) ([place]) ?  Essential hypertension ?  History of anemia due to chronic kidney disease ?  Elevated troponin ?  Decubitus ulcer ? ? ? ?Discharge instructions   ? ?Discharge Instructions   ? ? Discharge instructions   Complete by: As directed ?  ? Follow with Primary MD [name], PA in 7 days  ? ?Get CBC,  CMP, 2 view Chest X ray -  checked next visit within 1 week by Primary MD   ? ?Activity: As tolerated with Full fall precautions use walker/cane & assistance as needed ? ?Disposition Home   ? ?Diet: Renal diet with strict 1.2 L fluid restriction per day. ? ?Special Instructions: If you have smoked or chewed Tobacco  in the last 2 yrs please stop smoking, stop any regular Alcohol  and or any Recreational drug use. ? ?On your next visit with your primary care physician please Get Medicines reviewed and adjusted. ? ?Please request your Prim.MD to go over all Hospital Tests and Procedure/Radiological results at the follow up, please get all [facility]  records sent to your Prim MD by signing hospital release before you go home. ? ?If you experience worsening of your admission symptoms, develop shortness of breath, life threatening emergency, suicidal or homicidal thoughts you must seek medical attention immediately by calling 911 or calling your MD immediately  if symptoms less severe. ? ?You Must read complete instructions/literature along with all the possible adverse reactions/side effects for all the Medicines you take and that have been prescribed to you. Take any new Medicines after you have completely understood and accpet all the possible adverse reactions/side effects.  ? Discharge wound care:   Complete by: As directed ?  ? Keep your sacral wound clean and dry at all times.  ? Increase activity slowly   Complete by: As directed ?  ? ?  ? ? ?Discharge Medications  ? ?Allergies as of 01/14/2022   ? ?   Reactions  ? Vancomycin Shortness Of Breath, Itching  ? ?  ? ?  ?Medication List  ?  ? ?TAKE these medications   ? ?acetaminophen 500 MG tablet ?Commonly known as: TYLENOL ?Take 1,000 mg by mouth every 6 (six) hours as needed for moderate pain (pain). ?  ?albuterol 108 (90 Base) MCG/ACT inhaler ?Commonly known as: VENTOLIN HFA ?Inhale 2 puffs into the lungs 2 (two) times daily as needed for shortness of breath or  wheezing. ?  ?apixaban 2.5 MG Tabs tablet ?Commonly known as: ELIQUIS ?Take 1 tablet (2.5 mg total) by mouth 2 (two) times daily. ?  ?B-Complex/B-12 Tabs ?Take 1 tablet by mouth daily. ?  ?cinacalcet 60 MG tabl

## 2022-01-13 NOTE — Progress Notes (Signed)
D/C order noted. Pt was receiving HD when order was noted. Contacted [place] [facility] regarding pt's dc today. Clinic aware pt should resume care on Wednesday.  ? ?Melven Sartorius ?Renal Navigator ?[phone] ?

## 2022-01-13 NOTE — Progress Notes (Addendum)
?                                  PROGRESS NOTE                                             ?                                                                                                                     ?                                         ? ? Patient Demographics:  ? ? Thomas Robinson, is a 61 y.o. male, DOB - 11/19/1960, WUJ:[id number] ? ?Outpatient Primary MD for the patient is [name], [place]    LOS - 4  Admit date - 01/09/2022   ? ?Chief Complaint  ?Patient presents with  ? Chest Pain  ? Shortness of Breath  ? Dizziness  ?    ? ?Brief Narrative (HPI from H&P)   61 y.o. male with medical history significant of  ESRD on MWF HD, HFpEF (EF 60-65%), chronic respiratory failure with hypoxia on 4 L O2 via [place], paroxysmal atrial fibrillation on Eliquis, recurrent right pleural effusion, COPD, history of CVA, HTN, anemia of chronic kidney disease who presented to ED with complaints of worsening shortness of breath that started after dialysis yesterday in the evening.  Apparently patient is not compliant with his outpatient dialysis procedures and does not finish his full dialysis cycle, has had multiple admissions for fluid overload due to missed dialysis.  In the ER this admission he was diagnosed with acute on chronic hypoxic respiratory failure requiring 15 L of oxygen due to fluid overload with large right-sided pleural effusion and admitted to the hospital. ? ? Subjective:  ? ?Patient in bed, appears comfortable, denies any headache, no fever, no chest pain or pressure, improved shortness of breath , no abdominal pain. No new focal weakness. ? ? Assessment  & Plan :  ? ? ?Acute on chronic respiratory failure with hypoxia (HCC) in a patient who uses 4 L nasal cannula oxygen at baseline upon admission was requiring 15 L of high flow oxygen.  Large recurrent right-sided pleural effusion, multiple missed dialysis sessions. ? ?He clearly has fluid overload and worsening  right-sided pleural effusion from multiple missed and incomplete dialysis sessions in the outpatient setting, he has been again counseled on compliance, nephrology seeing the patient and he is undergoing dialysis for fluid removal, again being numbed noncompliant even in the hospital, counseled again, he also underwent diagnostic and therapeutic right-sided thoracentesis by pulmonary critical care on 01/10/2022 with 1.5 L of transudative fluid removed, follow cytology.  Shortness of breath has improved and currently on 5 L of nasal cannula oxygen up from 4 L at home, will repeat dialysis on 01/14/2022 subsequently if he is stable will be discharged home.  Depending on his clinical scenario may repeat chest x-ray and thoracentesis prior to discharge. ? ?2. ESRD on dialysis on HD MWF-but noncompliant with hyperkalemia  - poor compliance with dialysis and typically leaves session early, gently in fluid overload, nephrology on board.  Dialysis per nephrology, case discussed with nephrology team on 01/11/2022 ? ?3. Chest pain with elevated troponin -  Intermittent chest pain with some tingling down left arm that comes and goes.  Unremarkable EKG, left heart catheterization in December 2021 showed normal coronaries, troponin trend flat and not consistent with ACS, flat elevated troponin due to underlying ESRD and fluid overload with acute on chronic respiratory causing hypoxia and CHF.  Supportive care and monitor. Ch.pain resolved, TTE - LVH + ? ++ MS - outpt Cards. ? ?4. Acute on chronic diastolic (congestive) heart failure ([place]) -  see #1 and 2 above. ? ?5. Pleural effusion, right -  see #1 above  ? ?6. Paroxysmal atrial fibrillation (HCC)  - rate controlled, keep on telemetry, Continue eliquis and cardizem ? ?7. COPD (chronic obstructive pulmonary disease) (HCC) - No signs of exacerbation on exam, Continue with breo daily and SABA prn  ? ?8. Essential hypertension - Continue cardizem, add low-dose Coreg for better  control. ? ?9. History of anemia due to chronic kidney disease ?Stable, continue to monitor  ? ?10. Decubitus ulcer - Wound care consult, appreciate  ? ?11.  Severe protein calorie malnutrition.  Placed on protein supplementation. ? ? ? ?   ? ?Condition - Extremely Guarded ? ?Family Communication  :  none present ? ?Code Status :  Full ? ?Consults  :  Renal ? ?PUD Prophylaxis :  ? ? Procedures  :    ? ?  ? ?   ? ?Disposition Plan  :   ? ?Status is: Inpatient ? ?DVT Prophylaxis  :   ? ?apixaban (ELIQUIS) tablet 2.5 mg Start: 01/09/22 2200 ?apixaban (ELIQUIS) tablet 2.5 mg  ? ? ?Lab Results  ?Component Value Date  ? PLT 147 (L) 01/11/2022  ? ? ?Diet :  ?Diet Order   ? ?       ?  Diet regular Room service appropriate? Yes with Assist; Fluid consistency: Thin; Fluid restriction: 1200 mL Fluid  Diet effective now       ?  ? ?  ?  ? ?  ?  ? ?Inpatient Medications ? ?Scheduled Meds: ? apixaban  2.5 mg Oral BID  ? carvedilol  3.125 mg Oral BID WC  ? Chlorhexidine Gluconate Cloth  6 each Topical Q0600  ? cinacalcet  60 mg Oral Q supper  ? diltiazem  300 mg Oral q morning  ? feeding supplement (NEPRO CARB STEADY)  237 mL Oral BID BM  ? fluticasone furoate-vilanterol  1 puff Inhalation Daily  ? leptospermum manuka honey  1 application. Topical Daily  ? midodrine  10 mg Oral Once  ? multivitamin  1 tablet Oral q morning  ? sevelamer carbonate  800 mg Oral TID with meals  ? ?Continuous Infusions: ? ? ?PRN Meds:.acetaminophen **OR** acetaminophen, albuterol, morphine injection, nitroGLYCERIN, ondansetron (ZOFRAN) IV, oxyCODONE ? ?Antibiotics  :   ? ?Anti-infectives (From admission, onward)  ? ? None  ? ?  ? ? ? Time Spent in minutes  30 ? ? ?  Lala Lund M.D on 01/13/2022 at 9:49 AM ? ?To page go to [email or web address]  ? ?[facility] -  Office  [phone] ? ?See all Orders from today for further details ? ? ? Objective:  ? ?Vitals:  ? 01/13/22 0345 01/13/22 0753 01/13/22 0800 01/13/22 0841  ?BP:  (!) 160/83 (!) 160/83    ?Pulse:  84 80   ?Resp:  20 (!) 25   ?Temp:  97.9 ?F (36.6 ?C)    ?TempSrc:  Oral    ?SpO2:  (!) 86% (!) 84% 96%  ?Weight: 46.4 kg     ?Height:      ? ? ?Wt Readings from Last 3 Encounters:  ?01/13/22 46.4 kg  ?12/20/21 43.6 kg  ?12/10/21 44.8 kg  ? ? ?No intake or output data in the 24 hours ending 01/13/22 0949 ? ? ? ?Physical Exam ? ?Awake Alert, No new F.N deficits, Normal affect ?[place].AT,PERRAL ?Supple Neck, No JVD,   ?Symmetrical Chest wall movement, reduced right lower lobe breath sounds ?RRR,No Gallops, Rubs or new Murmurs,  ?+ve B.Sounds, Abd Soft, No tenderness,   ?No Cyanosis, Clubbing or edema  ? ?RN pressure injury documentation: ?Pressure Injury 12/14/21 Sacrum Medial Unstageable - Full thickness tissue loss in which the base of the injury is covered by slough (yellow, tan, gray, green or brown) and/or eschar (tan, brown or black) in the wound bed. (Active)  ?12/14/21 2130  ?Location: Sacrum  ?Location Orientation: Medial  ?Staging: Unstageable - Full thickness tissue loss in which the base of the injury is covered by slough (yellow, tan, gray, green or brown) and/or eschar (tan, brown or black) in the wound bed.  ?Wound Description (Comments):   ?Present on Admission: Yes  ?Dressing Type Foam - Lift dressing to assess site every shift 01/12/22 0931  ? ? ? Data Review:  ? ? ?CBC ?Recent Labs  ?Lab 01/09/22 ?1316 01/09/22 ?1339 01/10/22 ?0137 01/11/22 ?0119  ?WBC 6.3  --  7.7 5.9  ?HGB 11.9* 13.3 10.6* 10.1*  ?HCT 39.5 39.0 33.9* 33.2*  ?PLT 154  --  148* 147*  ?MCV 99.0  --  96.9 100.3*  ?MCH 29.8  --  30.3 30.5  ?MCHC 30.1  --  31.3 30.4  ?RDW 16.4*  --  16.2* 16.0*  ?LYMPHSABS 0.5*  --   --  0.4*  ?MONOABS 0.3  --   --  0.3  ?EOSABS 0.3  --   --  0.3  ?BASOSABS 0.1  --   --  0.1  ? ? ?Electrolytes ?Recent Labs  ?Lab 01/09/22 ?1316 01/09/22 ?1339 01/10/22 ?0137 01/11/22 ?0119  ?NA 138 136 138 136  138  ?K 6.2* 5.9* 6.2* 4.6  4.7  ?CL 98  --  99 100  100  ?CO2 23  --  '24 24  26  '$ ?GLUCOSE 115*  --   103* 104*  102*  ?BUN 53*  --  59* 35*  35*  ?CREATININE 7.99*  --  8.43* 5.30*  5.14*  ?CALCIUM 10.4*  --  9.7 9.1  9.3  ?AST 18  --   --  13*  ?ALT 12  --   --  9  ?ALKPHOS 94  --   --  77  ?BILITOT 1.5*  --   --  1.0  ?AL

## 2022-01-14 LAB — CHOLESTEROL, BODY FLUID: Cholesterol, Fluid: 33 mg/dL

## 2022-01-14 LAB — GLUCOSE, CAPILLARY: Glucose-Capillary: 132 mg/dL — ABNORMAL HIGH (ref 70–99)

## 2022-01-14 LAB — HEPATITIS B SURFACE ANTIBODY, QUANTITATIVE: Hep B S AB Quant (Post): <3.1 m[IU]/mL — ABNORMAL LOW (ref >9.9)

## 2022-01-14 NOTE — Care Management Important Message (Signed)
Important Message ? ?Patient Details  ?Name: Thomas Robinson ?MRN: [id number] ?Date of Birth: 1960/12/26 ? ? ?Medicare Important Message Given:  Yes ? ? ? ? ?Sheanna Dail ?01/14/2022, 3:23 PM ?

## 2022-01-14 NOTE — Progress Notes (Signed)
Patient refused AM labs. Patient instructed the phlebotomist to take blood from restricted side. When she informed patient that she could not do that, he got angry and stated "you won't get blood since you won't take it from where I tell you." This RN educated patient about the importance of avoiding restricted limb and having blood drawn. He states that he doesn't want to have blood drawn now and he might have it done later. VSS and will continue to monitor. ?

## 2022-01-14 NOTE — Progress Notes (Signed)
? [facility] ?Progress Note  ? ?Subjective:   Patient seen and examined at bedside.  Reports CP and worsened SOB this AM.  UF 1 L with HD yesterday.  Seen by cardiology who believes BP related to volume overload, pleural effusions &/or severe pulmonary HTN. Denies n/v/d.  Reports abdominal pain after taking nitro earlier.  States he is having increasing anxiety, advised to discuss with primary care on follow up visit.  ? ?Objective ?Vitals:  ? 01/14/22 0500 01/14/22 0700 01/14/22 0813 01/14/22 0827  ?BP: (!) 92/50 97/85 138/85   ?Pulse:   80   ?Resp:   19   ?Temp:   97.9 ?F (36.6 ?C)   ?TempSrc:   Oral   ?SpO2:   92% 93%  ?Weight: 55 kg     ?Height:      ? ?Physical Exam ?General:chronically ill appearing, cachetic male in NAD ?Heart:RRR, +4/1 systolic murmur ?Lungs:+crackles, decrease breath sounds, increased WOB on 4L O2 via [place] ?Abdomen:soft, NTND ?Extremities:no LE edema ?Dialysis Access: [facility]  ? ?Filed Weights  ? 01/13/22 1029 01/13/22 1259 01/14/22 0500  ?Weight: 52.5 kg 54.1 kg 55 kg  ? ? ?Intake/Output Summary (Last 24 hours) at 01/14/2022 1050 ?Last data filed at 01/14/2022 0816 ?Gross per 24 hour  ?Intake 360 ml  ?Output 1092 ml  ?Net -732 ml  ? ? ?Additional Objective ?Labs: ?Basic Metabolic Panel: ?Recent Labs  ?Lab 01/10/22 ?0137 01/11/22 ?0119 01/13/22 ?1153  ?NA 138 136  138 136  ?K 6.2* 4.6  4.7 5.4*  ?CL 99 100  100 99  ?CO2 '24 24  26 24  '$ ?GLUCOSE 103* 104*  102* 111*  ?BUN 59* 35*  35* 54*  ?CREATININE 8.43* 5.30*  5.14* 6.74*  ?CALCIUM 9.7 9.1  9.3 8.6*  ?PHOS  --  6.4* 6.7*  ? ?Liver Function Tests: ?Recent Labs  ?Lab 01/09/22 ?1316 01/11/22 ?0119 01/13/22 ?1153  ?AST 18 13*  --   ?ALT 12 9  --   ?ALKPHOS 94 77  --   ?BILITOT 1.5* 1.0  --   ?PROT 7.4 5.9*  --   ?ALBUMIN 3.2* 2.4*  2.4* 2.4*  ? ?CBC: ?Recent Labs  ?Lab 01/09/22 ?1316 01/09/22 ?1339 01/10/22 ?0137 01/11/22 ?0119  ?WBC 6.3  --  7.7 5.9  ?NEUTROABS 5.1  --   --  4.8  ?HGB 11.9* 13.3 10.6* 10.1*  ?HCT 39.5 39.0  33.9* 33.2*  ?MCV 99.0  --  96.9 100.3*  ?PLT 154  --  148* 147*  ? ?CBG: ?Recent Labs  ?Lab 01/10/22 ?1539 01/14/22 ?0818  ?GLUCAP 111* 132*  ? ? ?Studies/Results: ?DG Chest Port 1 View ? ?Result Date: 01/13/2022 ?CLINICAL DATA:  Chest pain, shortness of breath. EXAM: PORTABLE CHEST 1 VIEW COMPARISON:  Jan 12, 2022. FINDINGS: Stable cardiomegaly. Right internal jugular dialysis catheter is unchanged. Stable probable bilateral pulmonary edema is noted with bilateral pleural effusions, right greater than left. Bony thorax is unremarkable. IMPRESSION: Stable bilateral pulmonary edema is noted with bilateral pleural effusions, right greater than left. Electronically Signed   By: Marijo Conception M.D.   On: 01/13/2022 15:39   ? ?Medications: ? ? apixaban  2.5 mg Oral BID  ? Chlorhexidine Gluconate Cloth  6 each Topical Q0600  ? cinacalcet  60 mg Oral Q supper  ? diltiazem  300 mg Oral q morning  ? feeding supplement (NEPRO CARB STEADY)  237 mL Oral BID BM  ? fluticasone furoate-vilanterol  1 puff Inhalation Daily  ? leptospermum  manuka honey  1 application. Topical Daily  ? metoprolol succinate  50 mg Oral Q24H  ? midodrine  10 mg Oral Once  ? multivitamin  1 tablet Oral q morning  ? sevelamer carbonate  800 mg Oral TID with meals  ? ? ?Dialysis Orders: ?Center: [facility]  on MWF . ?3:30hr, BFR 400 DFR 800, EDW 44kg, 2K 2Ca, TDC, AVF maturing, No heparin ?- Mircera 250mg IV q 2 weeks - last dose 01/08/22 ?   ?Assessment/Plan: ?Chest pain:Seen by cardiology.  Not thought to be ischemic.  Likely d/t volume overload, pleural effusion &/or severe pulmonary HTN.  NOT a candidate for invasive procedure.  No TEE - would not change management.  Recommend staring Toprol if BP allows. Recommend hospice. Per primary. ?ESRD: Usual MWF schedule but shortens every treatment to 1-1.5 hours because he worries he will have chest pain. Discussed dangers, he completed 3hr HD 5/12.  Compliance has been discussed repeatedly. Ordered extra HD  today d/t worsened SOB and advised patient again to complete full treatment to help improve volume status. He could d/c home after if medically stable.  HD again tomorrow at outpatient clinic.  ?Hyperkalemia: Due to insufficient HD, improved s/p HD. ? Hypertension/volume: Volume overloaded and hypertensive on admit. BP much improved, CXR with minimal improvement from admit - ongoing pulmonary edema and pleural effusions.  ?R sided pleural effusion: S/p 1.6L thoracentesis 5/13, Cx pending. Cell count ok, consistent with transudative effusion.  ? Anemia: Hgb 10.1, follow. Not due for ESA yet. ? Metabolic bone disease: CorrCa improved, not on VDRA. Using low calcium bath and resume home sensipar. Phos high - on Renvela as binder. ? Nutrition:  Albumin 2.4, continue Nepro supps, Pro-source ? ? ?LJen Mow PA-C ?[facility] ?01/14/2022,10:50 AM ? LOS: 5 days  ? ? ?

## 2022-01-14 NOTE — Progress Notes (Signed)
Patient complained of increased SOB and chest pain this AM. Antionette Char, MD, who spoke with nephrology. Pt had been scheduled for hemodialysis today before discharge. Pt has now declined dialysis and said "there is no point if I already will have it tomorrow". Candiss Norse, MD notified of canceling dialysis. Pt will d/c today.  ?

## 2022-01-14 NOTE — TOC Transition Note (Signed)
Transition of Care (TOC) - CM/SW Discharge Note ? ? ?Patient Details  ?Name: Thomas Robinson ?MRN: [id number] ?Date of Birth: 05/31/1961 ? ?Transition of Care (TOC) CM/SW Contact:  ?Carles Collet, RN ?Phone Number: ?01/14/2022, 9:25 AM ? ? ?Clinical Narrative:   Notified [facility] liaison that patient will DC today. No other TOC needs identified ? ? ? ?Final next level of care: [place] ?Barriers to Discharge: No Barriers Identified ? ? ?Patient Goals and CMS Choice ?Patient states their goals for this hospitalization and ongoing recovery are:: n home ?CMS Medicare.gov Compare Post Acute Care list provided to:: Patient ?Choice offered to / list presented to : Patient ? ?Discharge Placement ?  ?           ?  ?  ?  ?  ? ?Discharge Plan and Services ?  ?  ?Post Acute Care Choice: Resumption of Svcs/PTA Provider          ?  ?  ?  ?  ?  ?HH Arranged: Therapist, sports, PT ?[place] Agency: [facility] ?Date HH Agency Contacted: 01/13/22 ?Time [place]: [id number] ?Representative spoke with at [place]: [place] ? ?Social Determinants of Health (SDOH) Interventions ?  ? ? ?Readmission Risk Interventions ? ?  01/14/2022  ?  9:24 AM 12/16/2021  ?  4:58 PM 11/20/2021  ? 11:09 AM  ?Readmission Risk Prevention Plan  ?Transportation Screening Complete Complete Complete  ?Medication Review (RN Care Manager) Referral to Pharmacy Complete Complete  ?PCP or Specialist appointment within 3-5 days of discharge Not Complete Complete Complete  ?PCP/Specialist Appt Not Complete comments MD recs 1 week    ?[place] or Home Care Consult Complete Complete Complete  ?SW Recovery Care/Counseling Consult Complete Complete Complete  ?Palliative Care Screening  Not Applicable Complete  ?[place]  Not Applicable Not Applicable  ? ? ? ? ? ?

## 2022-01-14 NOTE — Progress Notes (Signed)
Patient refused wound care.

## 2022-01-15 ENCOUNTER — Other Ambulatory Visit: Payer: Self-pay | Admitting: *Deleted

## 2022-01-15 DIAGNOSIS — N186 End stage renal disease: Secondary | ICD-10-CM

## 2022-01-17 ENCOUNTER — Other Ambulatory Visit: Payer: Self-pay

## 2022-01-17 ENCOUNTER — Emergency Department (HOSPITAL_COMMUNITY): Payer: Medicare Other

## 2022-01-17 ENCOUNTER — Inpatient Hospital Stay (HOSPITAL_COMMUNITY)
Admission: EM | Admit: 2022-01-17 | Discharge: 2022-01-30 | DRG: 189 | Disposition: E | Payer: Medicare Other | Attending: Family Medicine | Admitting: Family Medicine

## 2022-01-17 DIAGNOSIS — Z7985 Long-term (current) use of injectable non-insulin antidiabetic drugs: Secondary | ICD-10-CM

## 2022-01-17 DIAGNOSIS — Z66 Do not resuscitate: Secondary | ICD-10-CM | POA: Diagnosis present

## 2022-01-17 DIAGNOSIS — I4892 Unspecified atrial flutter: Secondary | ICD-10-CM | POA: Diagnosis present

## 2022-01-17 DIAGNOSIS — N186 End stage renal disease: Secondary | ICD-10-CM | POA: Diagnosis present

## 2022-01-17 DIAGNOSIS — Z79899 Other long term (current) drug therapy: Secondary | ICD-10-CM

## 2022-01-17 DIAGNOSIS — G8929 Other chronic pain: Secondary | ICD-10-CM | POA: Diagnosis present

## 2022-01-17 DIAGNOSIS — D631 Anemia in chronic kidney disease: Secondary | ICD-10-CM | POA: Diagnosis present

## 2022-01-17 DIAGNOSIS — Z881 Allergy status to other antibiotic agents status: Secondary | ICD-10-CM | POA: Diagnosis not present

## 2022-01-17 DIAGNOSIS — R54 Age-related physical debility: Secondary | ICD-10-CM | POA: Diagnosis present

## 2022-01-17 DIAGNOSIS — Z992 Dependence on renal dialysis: Secondary | ICD-10-CM

## 2022-01-17 DIAGNOSIS — R0789 Other chest pain: Secondary | ICD-10-CM | POA: Diagnosis not present

## 2022-01-17 DIAGNOSIS — E875 Hyperkalemia: Secondary | ICD-10-CM | POA: Diagnosis present

## 2022-01-17 DIAGNOSIS — G9349 Other encephalopathy: Secondary | ICD-10-CM | POA: Diagnosis not present

## 2022-01-17 DIAGNOSIS — E43 Unspecified severe protein-calorie malnutrition: Secondary | ICD-10-CM | POA: Diagnosis present

## 2022-01-17 DIAGNOSIS — Z981 Arthrodesis status: Secondary | ICD-10-CM

## 2022-01-17 DIAGNOSIS — Z681 Body mass index (BMI) 19 or less, adult: Secondary | ICD-10-CM

## 2022-01-17 DIAGNOSIS — R778 Other specified abnormalities of plasma proteins: Secondary | ICD-10-CM | POA: Diagnosis present

## 2022-01-17 DIAGNOSIS — I132 Hypertensive heart and chronic kidney disease with heart failure and with stage 5 chronic kidney disease, or end stage renal disease: Secondary | ICD-10-CM | POA: Diagnosis present

## 2022-01-17 DIAGNOSIS — G2581 Restless legs syndrome: Secondary | ICD-10-CM | POA: Diagnosis present

## 2022-01-17 DIAGNOSIS — Z9981 Dependence on supplemental oxygen: Secondary | ICD-10-CM | POA: Diagnosis not present

## 2022-01-17 DIAGNOSIS — Z91158 Patient's noncompliance with renal dialysis for other reason: Secondary | ICD-10-CM

## 2022-01-17 DIAGNOSIS — J90 Pleural effusion, not elsewhere classified: Secondary | ICD-10-CM | POA: Diagnosis present

## 2022-01-17 DIAGNOSIS — R0902 Hypoxemia: Secondary | ICD-10-CM | POA: Diagnosis present

## 2022-01-17 DIAGNOSIS — J9621 Acute and chronic respiratory failure with hypoxia: Secondary | ICD-10-CM | POA: Diagnosis present

## 2022-01-17 DIAGNOSIS — I5032 Chronic diastolic (congestive) heart failure: Secondary | ICD-10-CM | POA: Diagnosis present

## 2022-01-17 DIAGNOSIS — R64 Cachexia: Secondary | ICD-10-CM | POA: Diagnosis present

## 2022-01-17 DIAGNOSIS — E785 Hyperlipidemia, unspecified: Secondary | ICD-10-CM | POA: Diagnosis present

## 2022-01-17 DIAGNOSIS — Z87892 Personal history of anaphylaxis: Secondary | ICD-10-CM

## 2022-01-17 DIAGNOSIS — Z8673 Personal history of transient ischemic attack (TIA), and cerebral infarction without residual deficits: Secondary | ICD-10-CM

## 2022-01-17 DIAGNOSIS — I083 Combined rheumatic disorders of mitral, aortic and tricuspid valves: Secondary | ICD-10-CM | POA: Diagnosis present

## 2022-01-17 DIAGNOSIS — E162 Hypoglycemia, unspecified: Secondary | ICD-10-CM | POA: Diagnosis present

## 2022-01-17 DIAGNOSIS — J918 Pleural effusion in other conditions classified elsewhere: Secondary | ICD-10-CM | POA: Diagnosis present

## 2022-01-17 DIAGNOSIS — Z7901 Long term (current) use of anticoagulants: Secondary | ICD-10-CM

## 2022-01-17 DIAGNOSIS — M898X9 Other specified disorders of bone, unspecified site: Secondary | ICD-10-CM | POA: Diagnosis present

## 2022-01-17 DIAGNOSIS — F419 Anxiety disorder, unspecified: Secondary | ICD-10-CM | POA: Diagnosis present

## 2022-01-17 DIAGNOSIS — I272 Pulmonary hypertension, unspecified: Secondary | ICD-10-CM | POA: Diagnosis present

## 2022-01-17 DIAGNOSIS — K219 Gastro-esophageal reflux disease without esophagitis: Secondary | ICD-10-CM | POA: Diagnosis present

## 2022-01-17 DIAGNOSIS — Z91199 Patient's noncompliance with other medical treatment and regimen due to unspecified reason: Secondary | ICD-10-CM

## 2022-01-17 DIAGNOSIS — Z8674 Personal history of sudden cardiac arrest: Secondary | ICD-10-CM

## 2022-01-17 LAB — COMPREHENSIVE METABOLIC PANEL
ALT: 13 U/L (ref 0–44)
AST: 19 U/L (ref 15–41)
Albumin: 2.7 g/dL — ABNORMAL LOW (ref 3.5–5.0)
Alkaline Phosphatase: 98 U/L (ref 38–126)
Anion gap: 12 (ref 5–15)
BUN: 39 mg/dL — ABNORMAL HIGH (ref 8–23)
CO2: 24 mmol/L (ref 22–32)
Calcium: 9 mg/dL (ref 8.9–10.3)
Chloride: 103 mmol/L (ref 98–111)
Creatinine, Ser: 4.78 mg/dL — ABNORMAL HIGH (ref 0.61–1.24)
GFR, Estimated: 13 mL/min — ABNORMAL LOW (ref >60)
Glucose, Bld: 115 mg/dL — ABNORMAL HIGH (ref 70–99)
Potassium: 4.9 mmol/L (ref 3.5–5.1)
Sodium: 139 mmol/L (ref 135–145)
Total Bilirubin: 0.5 mg/dL (ref 0.3–1.2)
Total Protein: 6.3 g/dL — ABNORMAL LOW (ref 6.5–8.1)

## 2022-01-17 LAB — CBC WITH DIFFERENTIAL/PLATELET
Abs Immature Granulocytes: 0.01 10*3/uL (ref 0.00–0.07)
Basophils Absolute: 0.1 10*3/uL (ref 0.0–0.1)
Basophils Relative: 1 %
Eosinophils Absolute: 0.3 10*3/uL (ref 0.0–0.5)
Eosinophils Relative: 6 %
HCT: 35.3 % — ABNORMAL LOW (ref 39.0–52.0)
Hemoglobin: 11.1 g/dL — ABNORMAL LOW (ref 13.0–17.0)
Immature Granulocytes: 0 %
Lymphocytes Relative: 10 %
Lymphs Abs: 0.5 10*3/uL — ABNORMAL LOW (ref 0.7–4.0)
MCH: 30.7 pg (ref 26.0–34.0)
MCHC: 31.4 g/dL (ref 30.0–36.0)
MCV: 97.5 fL (ref 80.0–100.0)
Monocytes Absolute: 0.4 10*3/uL (ref 0.1–1.0)
Monocytes Relative: 8 %
Neutro Abs: 3.9 10*3/uL (ref 1.7–7.7)
Neutrophils Relative %: 75 %
Platelets: 153 10*3/uL (ref 150–400)
RBC: 3.62 MIL/uL — ABNORMAL LOW (ref 4.22–5.81)
RDW: 16.4 % — ABNORMAL HIGH (ref 11.5–15.5)
WBC: 5.3 10*3/uL (ref 4.0–10.5)
nRBC: 0 % (ref 0.0–0.2)

## 2022-01-17 LAB — TROPONIN I (HIGH SENSITIVITY): Troponin I (High Sensitivity): 131 ng/L (ref <18)

## 2022-01-17 LAB — I-STAT ARTERIAL BLOOD GAS, ED
Acid-Base Excess: 6 mmol/L — ABNORMAL HIGH (ref 0.0–2.0)
Bicarbonate: 29.1 mmol/L — ABNORMAL HIGH (ref 20.0–28.0)
Calcium, Ion: 1.17 mmol/L (ref 1.15–1.40)
HCT: 31 % — ABNORMAL LOW (ref 39.0–52.0)
Hemoglobin: 10.5 g/dL — ABNORMAL LOW (ref 13.0–17.0)
O2 Saturation: 97 %
Potassium: 4.8 mmol/L (ref 3.5–5.1)
Sodium: 137 mmol/L (ref 135–145)
TCO2: 30 mmol/L (ref 22–32)
pCO2 arterial: 36.5 mmHg (ref 32–48)
pH, Arterial: 7.51 — ABNORMAL HIGH (ref 7.35–7.45)
pO2, Arterial: 81 mmHg — ABNORMAL LOW (ref 83–108)

## 2022-01-17 LAB — LACTIC ACID, PLASMA
Lactic Acid, Venous: 1.1 mmol/L (ref 0.5–1.9)
Lactic Acid, Venous: 1 mmol/L (ref 0.5–1.9)

## 2022-01-17 MED ORDER — ACETAMINOPHEN 325 MG PO TABS
650.0000 mg | ORAL_TABLET | Freq: Four times a day (QID) | ORAL | Status: DC | PRN
  Administered 2022-01-18 – 2022-01-21 (×4): 650 mg via ORAL
  Filled 2022-01-17 (×4): qty 2

## 2022-01-17 MED ORDER — MORPHINE SULFATE (PF) 4 MG/ML IV SOLN
4.0000 mg | Freq: Once | INTRAVENOUS | Status: AC
  Administered 2022-01-17: 4 mg via INTRAVENOUS
  Filled 2022-01-17: qty 1

## 2022-01-17 MED ORDER — ACETAMINOPHEN 650 MG RE SUPP
650.0000 mg | Freq: Four times a day (QID) | RECTAL | Status: DC | PRN
Start: 2022-01-17 — End: 2022-01-22

## 2022-01-17 NOTE — Progress Notes (Signed)
Pt placed on Bipap at this time per MD order. ABG drawn and resulted.

## 2022-01-17 NOTE — ED Triage Notes (Signed)
Pt BIB [facility] from home after increasing CP since this morning. Pt has hx of same. Pt went to dialysis and refused to allow them to call EMS, went home and called from there. Pt took one dose of nitro and aspirin prior to EMS arrival (3 hrs ago). Upon EMS arrival pt was 75% on 2L, did not improve on 5L. Pt 96% on NRB. Pt A&Ox4

## 2022-01-17 NOTE — ED Provider Notes (Signed)
[facility] EMERGENCY DEPARTMENT Provider Note   CSN: [id number] Arrival date & time: 01/15/2022  1954     History  Chief Complaint  Patient presents with   Chest Pain    Thomas Robinson is a 61 y.o. male.  Patient presents chief complaint of shortness of breath and chest pain started this morning.  He went to dialysis and states he finished a full course of dialysis today.  When he told about his chest pain the advised ambulance to be called, patient refused and went home.  He continued of symptoms at home and then called the ambulance from home to present to the ER.  Denies any fevers or cough no vomiting or diarrhea.  Complaining of worsening shortness of breath despite having dialysis today.      Home Medications Prior to Admission medications   Medication Sig Start Date End Date Taking? Authorizing Provider  acetaminophen (TYLENOL) 500 MG tablet Take 1,000 mg by mouth every 6 (six) hours as needed for moderate pain (pain).    [provider]  albuterol (VENTOLIN HFA) 108 (90 Base) MCG/ACT inhaler Inhale 2 puffs into the lungs 2 (two) times daily as needed for shortness of breath or wheezing. 06/01/21   Oswald Hillock, MD  apixaban (ELIQUIS) 2.5 MG TABS tablet Take 1 tablet (2.5 mg total) by mouth 2 (two) times daily. 11/23/21 01/09/22  Arrien, Jimmy Picket, MD  B Complex Vitamins (B-COMPLEX/B-12) TABS Take 1 tablet by mouth daily. 11/25/21   [provider]  Cholecalciferol (VITAMIN D3 PO) Take 1 capsule by mouth See admin instructions. Twice a week - [place]    [provider]  cinacalcet (SENSIPAR) 60 MG tablet Take 1 tablet (60 mg total) by mouth every evening. Patient not taking: Reported on 01/09/2022 06/01/21   Oswald Hillock, MD  diltiazem (CARDIZEM CD) 300 MG 24 hr capsule Take 1 capsule (300 mg total) by mouth daily. Patient taking differently: Take 300 mg by mouth every morning. 09/21/21   Ghimire, Henreitta Leber, MD  ethyl  chloride spray Apply 1 application. topically 3 (three) times a week. 11/02/21   [provider]  feeding supplement (ENSURE ENLIVE / ENSURE PLUS) LIQD Take 237 mLs by mouth 2 (two) times daily between meals. Patient taking differently: Take 237 mLs by mouth daily. 09/13/21   Elgergawy, Silver Huguenin, MD  fluticasone furoate-vilanterol (BREO ELLIPTA) 100-25 MCG/ACT AEPB Inhale 1 puff into the lungs daily as needed (For shortness of breath).    [provider]  metoprolol tartrate (LOPRESSOR) 100 MG tablet Take 100 mg by mouth daily. 12/29/21   [provider]  midodrine (PROAMATINE) 10 MG tablet Take 10 mg by mouth 3 (three) times daily. Patient not taking: Reported on 01/09/2022    [provider]  multivitamin (RENA-VIT) TABS tablet Take 1 tablet by mouth every morning. 01/12/18   [provider]  nitroGLYCERIN (NITROSTAT) 0.4 MG SL tablet Place 1 tablet (0.4 mg total) under the tongue every 5 (five) minutes as needed for chest pain. 10/01/21   Aline August, MD  oxyCODONE (OXY IR/ROXICODONE) 5 MG immediate release tablet Take 1 tablet (5 mg total) by mouth every 6 (six) hours as needed for severe pain. 12/06/21   Arrien, Jimmy Picket, MD  sevelamer carbonate (RENVELA) 800 MG tablet Take 800 mg by mouth See admin instructions. Take one tablet (800 mg) by mouth three times daily - morning, evening and bedtime 05/31/21   [provider]  Allergies    Vancomycin    Review of Systems   Review of Systems  Constitutional:  Negative for fever.  HENT:  Negative for ear pain and sore throat.   Eyes:  Negative for pain.  Respiratory:  Positive for shortness of breath. Negative for cough.   Cardiovascular:  Positive for chest pain.  Gastrointestinal:  Negative for abdominal pain.  Genitourinary:  Negative for flank pain.  Musculoskeletal:  Negative for back pain.  Skin:  Negative for color change and rash.  Neurological:  Negative for syncope.  All  other systems reviewed and are negative.  Physical Exam Updated Vital Signs BP (!) 135/57   Pulse 78   Temp 97.8 F (36.6 C) (Oral)   Resp (!) 32   SpO2 97%  Physical Exam Constitutional:      Appearance: He is well-developed.  HENT:     Head: Normocephalic.     Nose: Nose normal.  Eyes:     Extraocular Movements: Extraocular movements intact.  Cardiovascular:     Rate and Rhythm: Regular rhythm.  Pulmonary:     Effort: Respiratory distress present.     Breath sounds: Wheezing present.  Abdominal:     Tenderness: There is no abdominal tenderness. There is no guarding or rebound.  Skin:    Coloration: Skin is not jaundiced.  Neurological:     Mental Status: He is alert. Mental status is at baseline.    ED Results / Procedures / Treatments   Labs (all labs ordered are listed, but only abnormal results are displayed) Labs Reviewed  CBC WITH DIFFERENTIAL/PLATELET - Abnormal; Notable for the following components:      Result Value   RBC 3.62 (*)    Hemoglobin 11.1 (*)    HCT 35.3 (*)    RDW 16.4 (*)    Lymphs Abs 0.5 (*)    All other components within normal limits  COMPREHENSIVE METABOLIC PANEL - Abnormal; Notable for the following components:   Glucose, Bld 115 (*)    BUN 39 (*)    Creatinine, Ser 4.78 (*)    Total Protein 6.3 (*)    Albumin 2.7 (*)    GFR, Estimated 13 (*)    All other components within normal limits  I-STAT ARTERIAL BLOOD GAS, ED - Abnormal; Notable for the following components:   pH, Arterial 7.510 (*)    pO2, Arterial 81 (*)    Bicarbonate 29.1 (*)    Acid-Base Excess 6.0 (*)    HCT 31.0 (*)    Hemoglobin 10.5 (*)    All other components within normal limits  TROPONIN I (HIGH SENSITIVITY) - Abnormal; Notable for the following components:   Troponin I (High Sensitivity) 131 (*)    All other components within normal limits  CULTURE, BLOOD (ROUTINE X 2)  CULTURE, BLOOD (ROUTINE X 2)  LACTIC ACID, PLASMA  LACTIC ACID, PLASMA  PHOSPHORUS   MAGNESIUM  MAGNESIUM  COMPREHENSIVE METABOLIC PANEL  CBC WITH DIFFERENTIAL/PLATELET  BLOOD GAS, VENOUS  TROPONIN I (HIGH SENSITIVITY)    EKG None  Radiology DG Chest Port 1 View  Result Date: 01/21/2022 CLINICAL DATA:  Dyspnea EXAM: PORTABLE CHEST 1 VIEW COMPARISON:  01/13/2022 FINDINGS: Moderate to large right pleural effusion has increased in size since prior examination with compressive atelectasis of the right lung base. Superimposed diffuse ground-glass pulmonary infiltrate appears progressive within the perihilar regions, suspicious for alveolar pulmonary edema. No pneumothorax. Right internal jugular hemodialysis catheter tip again noted within the superior vena cava.  Mild cardiomegaly is stable. No acute bone abnormality. IMPRESSION: Progressive diffuse pulmonary infiltrate and enlarging, moderate to large right pleural effusion in keeping with probable changes of progressive cardiogenic failure. Electronically Signed   By: Fidela Salisbury M.D.   On: 01/18/2022 20:55    Procedures Procedures    Medications Ordered in ED Medications  acetaminophen (TYLENOL) tablet 650 mg (has no administration in time range)    Or  acetaminophen (TYLENOL) suppository 650 mg (has no administration in time range)  morphine (PF) 4 MG/ML injection 4 mg (has no administration in time range)    ED Course/ Medical Decision Making/ A&P                           Medical Decision Making Amount and/or Complexity of Data Reviewed Labs: ordered. Radiology: ordered.   Chart review shows recent admission and discharged about 4 to 5 days ago.  Cardiac monitor showing sinus rhythm.  Work-up shows elevated troponin but remains at his baseline for the last several weeks.  Chest x-ray concerning for reaccumulation of right-sided pleural effusion appears enlarged per radiologist.  Patient remains hypoxic on his baseline oxygen requirement, placed on BiPAP doing much better.  Consultation with  hospitalist for admission.        Final Clinical Impression(s) / ED Diagnoses Final diagnoses:  Pleural effusion  Hypoxemia    Rx / DC Orders ED Discharge Orders     None         Luna Fuse, MD 01/14/2022 2304

## 2022-01-17 NOTE — Progress Notes (Signed)
Pt transported from ED04 to ED 28 on Bipap with no complications.

## 2022-01-18 ENCOUNTER — Encounter (HOSPITAL_COMMUNITY): Payer: Self-pay | Admitting: Internal Medicine

## 2022-01-18 ENCOUNTER — Inpatient Hospital Stay (HOSPITAL_COMMUNITY): Payer: Medicare Other

## 2022-01-18 DIAGNOSIS — R0789 Other chest pain: Secondary | ICD-10-CM | POA: Diagnosis not present

## 2022-01-18 DIAGNOSIS — N186 End stage renal disease: Secondary | ICD-10-CM | POA: Diagnosis not present

## 2022-01-18 DIAGNOSIS — J9621 Acute and chronic respiratory failure with hypoxia: Secondary | ICD-10-CM | POA: Diagnosis not present

## 2022-01-18 DIAGNOSIS — J90 Pleural effusion, not elsewhere classified: Secondary | ICD-10-CM | POA: Diagnosis not present

## 2022-01-18 LAB — COMPREHENSIVE METABOLIC PANEL
ALT: 14 U/L (ref 0–44)
AST: 20 U/L (ref 15–41)
Albumin: 2.5 g/dL — ABNORMAL LOW (ref 3.5–5.0)
Alkaline Phosphatase: 95 U/L (ref 38–126)
Anion gap: 12 (ref 5–15)
BUN: 44 mg/dL — ABNORMAL HIGH (ref 8–23)
CO2: 24 mmol/L (ref 22–32)
Calcium: 8.9 mg/dL (ref 8.9–10.3)
Chloride: 101 mmol/L (ref 98–111)
Creatinine, Ser: 5.08 mg/dL — ABNORMAL HIGH (ref 0.61–1.24)
GFR, Estimated: 12 mL/min — ABNORMAL LOW (ref >60)
Glucose, Bld: 107 mg/dL — ABNORMAL HIGH (ref 70–99)
Potassium: 5.5 mmol/L — ABNORMAL HIGH (ref 3.5–5.1)
Sodium: 137 mmol/L (ref 135–145)
Total Bilirubin: 0.5 mg/dL (ref 0.3–1.2)
Total Protein: 5.9 g/dL — ABNORMAL LOW (ref 6.5–8.1)

## 2022-01-18 LAB — CBC WITH DIFFERENTIAL/PLATELET
Abs Immature Granulocytes: 0.01 10*3/uL (ref 0.00–0.07)
Basophils Absolute: 0.1 10*3/uL (ref 0.0–0.1)
Basophils Relative: 2 %
Eosinophils Absolute: 0.4 10*3/uL (ref 0.0–0.5)
Eosinophils Relative: 9 %
HCT: 34.9 % — ABNORMAL LOW (ref 39.0–52.0)
Hemoglobin: 10.6 g/dL — ABNORMAL LOW (ref 13.0–17.0)
Immature Granulocytes: 0 %
Lymphocytes Relative: 13 %
Lymphs Abs: 0.6 10*3/uL — ABNORMAL LOW (ref 0.7–4.0)
MCH: 29.9 pg (ref 26.0–34.0)
MCHC: 30.4 g/dL (ref 30.0–36.0)
MCV: 98.3 fL (ref 80.0–100.0)
Monocytes Absolute: 0.4 10*3/uL (ref 0.1–1.0)
Monocytes Relative: 8 %
Neutro Abs: 3 10*3/uL (ref 1.7–7.7)
Neutrophils Relative %: 68 %
Platelets: 132 10*3/uL — ABNORMAL LOW (ref 150–400)
RBC: 3.55 MIL/uL — ABNORMAL LOW (ref 4.22–5.81)
RDW: 16.4 % — ABNORMAL HIGH (ref 11.5–15.5)
WBC: 4.3 10*3/uL (ref 4.0–10.5)
nRBC: 0 % (ref 0.0–0.2)

## 2022-01-18 LAB — BLOOD GAS, VENOUS
Acid-Base Excess: 3.3 mmol/L — ABNORMAL HIGH (ref 0.0–2.0)
Bicarbonate: 29.1 mmol/L — ABNORMAL HIGH (ref 20.0–28.0)
O2 Saturation: 80.9 %
Patient temperature: 36.6
pCO2, Ven: 47 mmHg (ref 44–60)
pH, Ven: 7.4 (ref 7.25–7.43)
pO2, Ven: 53 mmHg — ABNORMAL HIGH (ref 32–45)

## 2022-01-18 LAB — PHOSPHORUS: Phosphorus: 6.6 mg/dL — ABNORMAL HIGH (ref 2.5–4.6)

## 2022-01-18 LAB — MAGNESIUM
Magnesium: 2.4 mg/dL (ref 1.7–2.4)
Magnesium: 2.4 mg/dL (ref 1.7–2.4)

## 2022-01-18 LAB — TROPONIN I (HIGH SENSITIVITY)
Troponin I (High Sensitivity): 151 ng/L (ref <18)
Troponin I (High Sensitivity): 140 ng/L (ref <18)

## 2022-01-18 MED ORDER — HYDROMORPHONE HCL 1 MG/ML IJ SOLN
0.5000 mg | INTRAMUSCULAR | Status: DC | PRN
  Administered 2022-01-18 – 2022-01-20 (×15): 0.5 mg via INTRAVENOUS
  Filled 2022-01-18 (×16): qty 1

## 2022-01-18 MED ORDER — DILTIAZEM HCL ER COATED BEADS 180 MG PO CP24
300.0000 mg | ORAL_CAPSULE | Freq: Every morning | ORAL | Status: DC
  Administered 2022-01-18 – 2022-01-21 (×4): 300 mg via ORAL
  Filled 2022-01-18 (×4): qty 1

## 2022-01-18 MED ORDER — METOPROLOL TARTRATE 100 MG PO TABS
100.0000 mg | ORAL_TABLET | Freq: Every day | ORAL | Status: DC
Start: 2022-01-18 — End: 2022-01-21
  Administered 2022-01-18 – 2022-01-20 (×3): 100 mg via ORAL
  Filled 2022-01-18 (×4): qty 1

## 2022-01-18 MED ORDER — CHLORHEXIDINE GLUCONATE CLOTH 2 % EX PADS
6.0000 | MEDICATED_PAD | Freq: Every day | CUTANEOUS | Status: DC
  Administered 2022-01-18 – 2022-01-21 (×2): 6 via TOPICAL

## 2022-01-18 MED ORDER — LORAZEPAM 2 MG/ML IJ SOLN
0.5000 mg | Freq: Four times a day (QID) | INTRAMUSCULAR | Status: DC | PRN
  Administered 2022-01-18 – 2022-01-20 (×9): 0.5 mg via INTRAVENOUS
  Filled 2022-01-18 (×11): qty 1

## 2022-01-18 MED ORDER — ALBUTEROL SULFATE (2.5 MG/3ML) 0.083% IN NEBU
2.5000 mg | INHALATION_SOLUTION | Freq: Two times a day (BID) | RESPIRATORY_TRACT | Status: DC | PRN
  Administered 2022-01-19 – 2022-01-21 (×4): 2.5 mg via RESPIRATORY_TRACT
  Filled 2022-01-18 (×4): qty 3

## 2022-01-18 MED ORDER — NALOXONE HCL 0.4 MG/ML IJ SOLN
0.4000 mg | INTRAMUSCULAR | Status: DC | PRN
Start: 2022-01-18 — End: 2022-01-22

## 2022-01-18 MED ORDER — HYDROCERIN EX CREA
TOPICAL_CREAM | Freq: Two times a day (BID) | CUTANEOUS | Status: DC
  Filled 2022-01-18 (×2): qty 113

## 2022-01-18 MED ORDER — APIXABAN 2.5 MG PO TABS
2.5000 mg | ORAL_TABLET | Freq: Two times a day (BID) | ORAL | Status: DC
  Administered 2022-01-18 – 2022-01-22 (×8): 2.5 mg via ORAL
  Filled 2022-01-18 (×8): qty 1

## 2022-01-18 MED ORDER — LIDOCAINE HCL (PF) 1 % IJ SOLN
INTRAMUSCULAR | Status: AC
  Filled 2022-01-18: qty 30

## 2022-01-18 MED ORDER — NEPRO/CARBSTEADY PO LIQD: 237.0000 mL | Freq: Three times a day (TID) | ORAL | Status: DC

## 2022-01-18 MED ORDER — SODIUM ZIRCONIUM CYCLOSILICATE 10 G PO PACK
10.0000 g | PACK | Freq: Two times a day (BID) | ORAL | Status: DC
  Administered 2022-01-18: 10 g via ORAL
  Filled 2022-01-18 (×2): qty 1

## 2022-01-18 NOTE — Progress Notes (Signed)
Pt refused BiPAP tonight will call if he needs it.

## 2022-01-18 NOTE — Assessment & Plan Note (Signed)
(  Please see acute on chronic hypoxic respiratory failure)

## 2022-01-18 NOTE — Procedures (Signed)
PROCEDURE SUMMARY:  Successful image-guided right thoracentesis. Yielded 1.6 liters of clear yellow fluid. Procedure was aborted at this amount due to significant coughing and discomfort, small amount of pleural fluid remains on post procedure [place] which is not amenable to repeat thoracentesis at this time.  Patient tolerated procedure well. EBL < 1 mL No immediate complications.  Specimen was not sent for labs. Post procedure CXR shows no pneumothorax.  Please see imaging section of Epic for full dictation.  Joaquim Nam PA-C 01/18/2022 9:27 AM

## 2022-01-18 NOTE — Assessment & Plan Note (Signed)
 #)   ESRD: on HD (schedule: Monday, Wednesday, Friday). Next due for routine HD on Monday, 01/20/2022. No clinical evidence for urgent overnight HD or to expedite HD relative to this timeframe.  Attended full hemodialysis session on Friday, 01/13/2022.  Plan: monitor strict I's/O's, daily weights. CMP in the AM. Check mag and phos levels.

## 2022-01-18 NOTE — Assessment & Plan Note (Signed)
  #)   Paroxysmal atrial flutter: Documented history of such, on chronic anticoagulation via Eliquis.  AV nodal blocking regimen consists of Lopressor and diltiazem.  Presenting chest x-ray demonstrates sinus rhythm without overt evidence of acute ischemic changes.  Plan: Continue chronic anticoagulation on Eliquis as well as home AV nodal blocking regimen.  Add on serum magnesium level.  Monitor on telemetry.

## 2022-01-18 NOTE — ED Notes (Signed)
Pt requesting a break from CPAP; placed on 5L [place]; requesting cranberry juice

## 2022-01-18 NOTE — ED Notes (Signed)
Patient has removed bipap and refuses to wear it. Patient requests he be placed on 5L O2 via [place] and reports this is his home therapy.

## 2022-01-18 NOTE — Assessment & Plan Note (Signed)
 #)   Acute on chronic hypoxic respiratory failure in the setting of recurrent right pleural effusion: Relative to baseline supplemental oxygen requirements of 4 L continuous nasal cannula, patient's initial O2 sats noted to be in the mid 80s, subsequent improving into the mid 90s following interval initiation of BiPAP, with settings as quantified above, with chest x-ray revealing resolution of right-sided pleural effusion.  This is relative to recent hospitalization in which he was admitted with right-sided pleural effusion, undergoing thoracentesis at that time, with associated painful 1.5 L.  Appears that he would benefit from additional therapeutic thoracentesis.  Will consult interventional radiology for this purpose.  Of note, ACS felt to be less likely given the atypical nature of his chest discomfort, with high sensitive troponin I consistent with his chronic range.  Acute pulmonary embolism less likely in the setting of chronic anticoagulation on Eliquis.  Plan: Monitor continuous pulse oximetry.  Continue BiPAP for now.  Check blood gas.  Consult placed with interventional radiology for therapeutic thoracentesis, as above.  Add on serum magnesium level and serum phosphorus level.  Pete CMP and CBC in the morning.

## 2022-01-18 NOTE — Progress Notes (Addendum)
PROGRESS NOTE    Thomas Robinson  LOV:[id number] DOB: 1961-05-09 DOA: 01/25/2022 PCP: [name], PA   Chief Complaint  Patient presents with   Chest Pain    Brief Narrative:   Thomas Robinson is a 61 y.o. male with medical history significant for incisional disease on hemodialysis on Monday, Wednesday, Friday, chronic hypoxic respiratory failure on 2 to 4 L continuous supplemental oxygen via nasal cannula, chronic diastolic heart failure, paroxysmal atrial flutter chronically anticoagulated on Eliquis, who is admitted to [facility] on 01/28/2022 with acute on chronic hypoxic respiratory failure in the setting of recurrent right pleural effusion after presenting from home to [facility] ED complaining of chest pain.  -Work-up significant for recurrent large right pleural effusion.  Assessment & Plan:   Principal Problem:   Acute on chronic respiratory failure with hypoxia (HCC) Active Problems:   Pleural effusion, right   Atypical chest pain   ESRD (end stage renal disease) (HCC)   Paroxysmal atrial flutter (HCC)  Acute on chronic hypoxic respiratory failure in the setting of recurrent right pleural effusion: -On 4 L nasal cannula oxygen at baseline, he is on 10 L oxygen this morning -This is secondary to recurrent right pleural effusion, hopefully his oxygen requirements will improve after thoracentesis, he was encouraged with incentive spirometry and flutter valve.  recurrent right pleural effusion -Transudative, most likely in the setting of renal failure -Status post thoracentesis today with 1.6 L drained -Recent work-up in the past significant for transudative effusion, and otology negative for malignancy  Atypical chest pain -Due to above, consistent with atypical chest pain, no ischemic changes and EKG,  ESRD -Monday Wednesday Friday schedule, no indication for emergent dialysis currently, will consult renal if he remains stable Monday.  Paroxysmal a  flutter -Continue with Eliquis -Continue with home medications  Severe protein calorie malnutrition -We started on Nepro  Hyperkalemia -Potassium of 5.5, will give Lokelma today, recheck in a.m., will change to renal diet.    DVT prophylaxis: Eliquis Code Status: Full Family Communication: none at bedside Disposition:   Status is: Inpatient Remains inpatient appropriate because: increased oxygen requirment   Consultants:  IR   Subjective:  Reports itching, generalized weakness, fatigue, shortness of breath  Objective: Vitals:   01/18/22 0920 01/18/22 1017 01/18/22 1143 01/18/22 1401  BP: (!) 139/97 (!) 148/91 114/67   Pulse:  79 64   Resp:   18   Temp:      TempSrc:      SpO2:   100% 94%  Weight:      Height:        Intake/Output Summary (Last 24 hours) at 01/18/2022 1503 Last data filed at 01/18/2022 0400 Gross per 24 hour  Intake 240 ml  Output --  Net 240 ml   Filed Weights   01/18/22 0330 01/18/22 0337  Weight: 47.1 kg 47.6 kg    Examination:  Awake Alert, Oriented X 3, extremely frail, cachectic, deconditioned Symmetrical Chest wall movement, no vacantly diminished air entry in the right lung RRR,No Gallops,Rubs or new Murmurs, No Parasternal Heave +ve B.Sounds, Abd Soft, No tenderness, No rebound - guarding or rigidity. No Cyanosis, Clubbing or edema, No new Rash or bruise      Data Reviewed: I have personally reviewed following labs and imaging studies  CBC: Recent Labs  Lab 01/25/2022 2047 01/24/2022 2104 01/18/22 0541  WBC 5.3  --  4.3  NEUTROABS 3.9  --  3.0  HGB 11.1* 10.5* 10.6*  HCT  35.3* 31.0* 34.9*  MCV 97.5  --  98.3  PLT 153  --  132*    Basic Metabolic Panel: Recent Labs  Lab 01/13/22 1153 01/09/2022 2047 01/24/2022 2104 01/26/2022 2310 01/18/22 0541  NA 136 139 137  --  137  K 5.4* 4.9 4.8  --  5.5*  CL 99 103  --   --  101  CO2 24 24  --   --  24  GLUCOSE 111* 115*  --   --  107*  BUN 54* 39*  --   --  44*   CREATININE 6.74* 4.78*  --   --  5.08*  CALCIUM 8.6* 9.0  --   --  8.9  MG  --   --   --  2.4 2.4  PHOS 6.7*  --   --   --  6.6*    GFR: Estimated Creatinine Clearance: 10.2 mL/min (A) (by C-G formula based on SCr of 5.08 mg/dL (H)).  Liver Function Tests: Recent Labs  Lab 01/13/22 1153 01/29/2022 2047 01/18/22 0541  AST  --  19 20  ALT  --  13 14  ALKPHOS  --  98 95  BILITOT  --  0.5 0.5  PROT  --  6.3* 5.9*  ALBUMIN 2.4* 2.7* 2.5*    CBG: Recent Labs  Lab 01/14/22 0818  GLUCAP 132*     Recent Results (from the past 240 hour(s))  Resp Panel by RT-PCR (Flu A&B, Covid) Nasopharyngeal Swab     Status: None   Collection Time: 01/09/22 12:55 PM   Specimen: Nasopharyngeal Swab; Nasopharyngeal(NP) swabs in vial transport medium  Result Value Ref Range Status   SARS Coronavirus 2 by RT PCR NEGATIVE NEGATIVE Final    Comment: (NOTE) SARS-CoV-2 target nucleic acids are NOT DETECTED.  The SARS-CoV-2 RNA is generally detectable in upper respiratory specimens during the acute phase of infection. The lowest concentration of SARS-CoV-2 viral copies this assay can detect is 138 copies/mL. A negative result does not preclude SARS-Cov-2 infection and should not be used as the sole basis for treatment or other patient management decisions. A negative result may occur with  improper specimen collection/handling, submission of specimen other than nasopharyngeal swab, presence of viral mutation(s) within the areas targeted by this assay, and inadequate number of viral copies(<138 copies/mL). A negative result must be combined with clinical observations, patient history, and epidemiological information. The expected result is Negative.  Fact Sheet for Patients:  EntrepreneurPulse.com.au  Fact Sheet for Healthcare Providers:  IncredibleEmployment.be  This test is no t yet approved or cleared by the [place] FDA and  has been authorized for  detection and/or diagnosis of SARS-CoV-2 by FDA under an Emergency Use Authorization (EUA). This EUA will remain  in effect (meaning this test can be used) for the duration of the COVID-19 declaration under Section 564(b)(1) of the Act, 21 U.S.C.section 360bbb-3(b)(1), unless the authorization is terminated  or revoked sooner.       Influenza A by PCR NEGATIVE NEGATIVE Final   Influenza B by PCR NEGATIVE NEGATIVE Final    Comment: (NOTE) The Xpert Xpress SARS-CoV-2/FLU/RSV plus assay is intended as an aid in the diagnosis of influenza from Nasopharyngeal swab specimens and should not be used as a sole basis for treatment. Nasal washings and aspirates are unacceptable for Xpert Xpress SARS-CoV-2/FLU/RSV testing.  Fact Sheet for Patients: EntrepreneurPulse.com.au  Fact Sheet for Healthcare Providers: IncredibleEmployment.be  This test is not yet approved or cleared by the [place]  States FDA and has been authorized for detection and/or diagnosis of SARS-CoV-2 by FDA under an Emergency Use Authorization (EUA). This EUA will remain in effect (meaning this test can be used) for the duration of the COVID-19 declaration under Section 564(b)(1) of the Act, 21 U.S.C. section 360bbb-3(b)(1), unless the authorization is terminated or revoked.  Performed at [facility], [place] [address], [place], [place] [address]   Body fluid culture w Gram Stain     Status: None   Collection Time: 01/10/22  1:36 PM   Specimen: Pleural Fluid  Result Value Ref Range Status   Specimen Description PLEURAL  Final   Special Requests NONE  Final   Gram Stain   Final    WBC PRESENT, PREDOMINANTLY PMN NO ORGANISMS SEEN CYTOSPIN SMEAR    Culture   Final    NO GROWTH Performed at [facility], [place] [address] [place], [place] [address]    Report Status 01/13/2022 FINAL  Final  Culture, blood (routine x 2)     Status: None (Preliminary result)   Collection  Time: 01/24/2022  8:47 PM   Specimen: BLOOD  Result Value Ref Range Status   Specimen Description BLOOD BLOOD LEFT HAND  Final   Special Requests   Final    BOTTLES DRAWN AEROBIC AND ANAEROBIC Blood Culture results may not be optimal due to an inadequate volume of blood received in culture bottles   Culture   Final    NO GROWTH < 12 HOURS Performed at [facility], [place] [address], [place], [place] [address]    Report Status PENDING  Incomplete  Culture, blood (routine x 2)     Status: None (Preliminary result)   Collection Time: 01/02/2022 11:28 PM   Specimen: BLOOD  Result Value Ref Range Status   Specimen Description BLOOD SITE NOT SPECIFIED  Final   Special Requests   Final    BOTTLES DRAWN AEROBIC AND ANAEROBIC Blood Culture adequate volume   Culture   Final    NO GROWTH < 12 HOURS Performed at [facility], [place] [address], [place], [place] [address]    Report Status PENDING  Incomplete         Radiology Studies: DG Chest 1 View  Result Date: 01/18/2022 CLINICAL DATA:  Status post right thoracentesis EXAM: CHEST  1 VIEW COMPARISON:  01/18/2022 FINDINGS: There is interval decrease in amount of right pleural effusion. There is no pneumothorax. Small bilateral pleural effusions are seen, more so on the right side. Transverse diameter of heart is increased. There is decrease in pulmonary vascular congestion with residual prominence of central pulmonary vessels. There is improvement in aeration of parahilar regions suggesting decrease in pulmonary edema. No new focal infiltrates are seen. There is surgical fusion in the lower cervical spine. Tip of right IJ dialysis catheter is seen in the superior vena cava. IMPRESSION: Cardiomegaly. There is decrease in pulmonary vascular congestion. There is decrease in right pleural effusion. There is no pneumothorax. Electronically Signed   By: Elmer Picker M.D.   On: 01/18/2022 09:58   DG Chest Port 1 View  Result Date:  01/20/2022 CLINICAL DATA:  Dyspnea EXAM: PORTABLE CHEST 1 VIEW COMPARISON:  01/13/2022 FINDINGS: Moderate to large right pleural effusion has increased in size since prior examination with compressive atelectasis of the right lung base. Superimposed diffuse ground-glass pulmonary infiltrate appears progressive within the perihilar regions, suspicious for alveolar pulmonary edema. No pneumothorax. Right internal jugular hemodialysis catheter  tip again noted within the superior vena cava. Mild cardiomegaly is stable. No acute bone abnormality. IMPRESSION: Progressive diffuse pulmonary infiltrate and enlarging, moderate to large right pleural effusion in keeping with probable changes of progressive cardiogenic failure. Electronically Signed   By: Fidela Salisbury M.D.   On: 01/20/2022 20:55        Scheduled Meds:  apixaban  2.5 mg Oral BID   Chlorhexidine Gluconate Cloth  6 each Topical Daily   diltiazem  300 mg Oral q morning   hydrocerin   Topical BID   metoprolol tartrate  100 mg Oral Daily   Continuous Infusions:   LOS: 1 day      Phillips Climes, MD [facility]   To contact the attending provider between 7A-7P or the covering provider during after hours 7P-7A, please log into the web site [email or web address] and access using universal [place] password for that web site. If you do not have the password, please call the hospital operator.  01/18/2022, 3:03 PM

## 2022-01-18 NOTE — ED Notes (Signed)
Patient continues to refuses bipap and verbalizes understanding of risks associate with this decision.

## 2022-01-18 NOTE — H&P (Signed)
History and Physical    PLEASE NOTE THAT DRAGON DICTATION SOFTWARE WAS USED IN THE CONSTRUCTION OF THIS NOTE.   Thomas Robinson QVZ:[id number] DOB: [birth year] DOA: 01/06/2022  PCP: [name], PA  Patient coming from: home   I have personally briefly reviewed patient's old medical records in [place]  Chief Complaint: Chest pain  HPI: Thomas Robinson is a 61 y.o. male with medical history significant for incisional disease on hemodialysis on Monday, Wednesday, Friday, chronic hypoxic respiratory failure on 2 to 4 L continuous supplemental oxygen via nasal cannula, chronic diastolic heart failure, paroxysmal atrial flutter chronically anticoagulated on Eliquis, who is admitted to [facility] on 01/12/2022 with acute on chronic hypoxic respiratory failure in the setting of recurrent right pleural effusion after presenting from home to [facility] ED complaining of chest pain.   The patient reports 1 day of sharp substernal chest discomfort without radiation, that worsens with deep inspiration as well as with cough.  Reports that this pain is nonexertional, and not reproducible with deep palpation over the anterior chest wall.  He notes associated mild shortness of breath, in the absence of any associated subjective fever, chills, rigors, or generalized measures.  Not associate with any hemoptysis or wheezing.  Reports that the symptoms are very similar to that with which she had recently presented to [facility], at which time he was hospitalized within the last 2 weeks during which she underwent therapeutic thoracentesis via IR for right-sided pleural effusion, during which was reported volume of 1.5 L removed.  He states that his chest pain has been constant since onset, and has noted no significant improvement with nitroglycerin.  Medical history also notable for end-stage renal disease on hemodialysis, decompensated on Friday schedule.  His most recent hemodialysis session occurred  earlier today, at which time he was able to attend a full session.  He noted chest pain throughout hemodialysis, of the above quality, but refused recommendation from hemodialysis center staff to go to the ED at that time for evaluation of chest pain.  However, upon returning home, noted persistence of this chest pain, ultimately reconsidering and presenting to [facility] emergency department further management thereof.  Reportedly has baseline chronic elevation in troponin, baseline values in the range of 100-200.    ED Course:  Vital signs in the ED were notable for the following: Afebrile; initial oxygen saturation in the mid 80s on his baseline 4 L nasal cannula, prompting initiation of BiPAP with settings of 14/6 and 40% FiO2, with ensuing improvement in oxygen saturations into the mid to high 90s.  Afebrile.  Labs were notable for the following: Initial high-sensitivity troponin I 131, repeat value trending up to 151.  CBC notable for white cell count 5300.  Imaging and additional notable ED work-up: Chest x-ray shows reaccumulation of right-sided pleural effusion.  EKG shows sinus rhythm with heart rate 77, nonspecific T wave inversion in leads aVL, V1, V2, no evidence of ST elevation.  Subsequently, the patient was admitted for further evaluation management of acute on chronic hypoxic respiratory failure in the setting of recurrent right pleural effusion.      Review of Systems: As per HPI otherwise 10 point review of systems negative.   Past Medical History:  Diagnosis Date   Anaphylactic shock, unspecified, sequela 06/10/2019   Aortic insufficiency    Aortic stenosis    Arthritis    Asthma, chronic, unspecified asthma severity, with acute exacerbation 10/23/2017   CAP (community acquired pneumonia) 10/23/2017  Carpal tunnel syndrome of right wrist 10/05/2018   Cataract    right - removed by surgery   Cerebral infarction due to thrombosis of cerebral artery (HCC)    Cervical  disc herniation 01/04/2020   Chronic diastolic heart failure ([place]) 04/13/2018   Chronic low back pain 11/24/2019   Constipation    COPD (chronic obstructive pulmonary disease) (HCC)    Cough    chronic cough   Encounter for immunization 07/08/2017   ESRD on hemodialysis ([place]) 02/16/2018   Fall 06/04/2019   GERD (gastroesophageal reflux disease) 06/04/2019   GI bleeding 05/24/2017   Gram-negative sepsis, unspecified ([place]) 09/05/2017   History of fusion of cervical spine 03/26/2020   Hyperlipidemia    Hypertension    Hypotension    Iron deficiency anemia, unspecified 09/09/2017   Left shoulder pain 11/11/2019   Lumbar radiculopathy 11/24/2019   Lung contusion 06/04/2019   LVH (left ventricular hypertrophy) due to hypertensive disease, with heart failure ([place]) 06/18/2020   Macrocytic anemia 05/24/2017   Mitral regurgitation    Moderate protein-calorie malnutrition ([place]) 06/06/2017   Myofascial pain syndrome 06/12/2020   Neuritis of right ulnar nerve 09/13/2018   Non-compliance with renal dialysis 02/08/2020   Oxygen deficiency 12/30/2019   O2 sats on RA 87% at PAT appt    Pain in joint of right elbow 09/13/2018   Paroxysmal atrial flutter (HCC)    PEA (Pulseless electrical activity) ([place])    after sedation in 07/2021   Pulmonary hypertension ([place])    Renovascular hypertension 06/22/2020   Restless leg syndrome    Rib fractures 06/2019   Right   S/P cardiac cath 08/27/2020   normal coronary arteries.   Secondary hyperparathyroidism of renal origin ([place]) 06/04/2017   Thoracic ascending aortic aneurysm [facility])    Tricuspid regurgitation    Ulnar neuropathy 10/05/2018    Past Surgical History:  Procedure Laterality Date   A/V FISTULAGRAM N/A 01/01/2021   Procedure: A/V FISTULAGRAM;  Surgeon: Serafina Mitchell, MD;  Location: [place] [facility];  Service: Cardiovascular;  Laterality: N/A;   A/V FISTULAGRAM N/A 01/15/2021   Procedure: A/V [id number];  Surgeon: Serafina Mitchell,  MD;  Location: [place] [facility];  Service: Cardiovascular;  Laterality: N/A;   ANTERIOR CERVICAL DECOMP/DISCECTOMY FUSION N/A 01/04/2020   Procedure: ANTERIOR CERVICAL DECOMPRESSION/DISCECTOMY FUSION CERVICAL FIVE THROUGH SEVEN;  Surgeon: Melina Schools, MD;  Location: [place];  Service: Orthopedics;  Laterality: N/A;  3 hrs   AV FISTULA PLACEMENT Left 05/28/2017   Procedure: LEFT ARM ARTERIOVENOUS (AV) FISTULA CREATION;  Surgeon: Conrad Industry, MD;  Location: [place];  Service: Vascular;  Laterality: Left;   AV FISTULA PLACEMENT Left 02/02/2021   Procedure: Debride left forearm, ligation of left upper arm Aretiovenous fistula;  Surgeon: Elam Dutch, MD;  Location: [place];  Service: Vascular;  Laterality: Left;   AV FISTULA PLACEMENT Right 04/04/2021   Procedure: RIGHT ARTERIOVENOUS (AV) FISTULA CREATION;  Surgeon: Serafina Mitchell, MD;  Location: [place] OR;  Service: Vascular;  Laterality: Right;   BIOPSY  07/10/2021   Procedure: BIOPSY;  Surgeon: Lavena Bullion, DO;  Location: [place] [facility];  Service: Gastroenterology;;   COLONOSCOPY WITH PROPOFOL N/A 07/10/2021   Procedure: COLONOSCOPY WITH PROPOFOL;  Surgeon: Lavena Bullion, DO;  Location: [place];  Service: Gastroenterology;  Laterality: N/A;   ESOPHAGOGASTRODUODENOSCOPY (EGD) WITH PROPOFOL N/A 07/10/2021   Procedure: ESOPHAGOGASTRODUODENOSCOPY (EGD) WITH PROPOFOL;  Surgeon: Lavena Bullion, DO;  Location: [place];  Service: Gastroenterology;  Laterality: N/A;   EYE SURGERY Right 06/02/2019   Cataract removed   FRACTURE SURGERY     INSERTION OF DIALYSIS CATHETER Right 02/02/2021   Procedure: INSERTION OF Right Internal jugular TUNNELED  DIALYSIS CATHETER;  Surgeon: Elam Dutch, MD;  Location: [place];  Service: Vascular;  Laterality: Right;   IR DIALY SHUNT INTRO NEEDLE/INTRACATH INITIAL W/IMG LEFT Left 09/12/2020   IR FLUORO GUIDE CV LINE RIGHT  05/25/2017   IR FLUORO GUIDE CV LINE RIGHT  07/17/2021   IR THORACENTESIS ASP  PLEURAL SPACE W/IMG GUIDE  09/11/2021   IR THORACENTESIS ASP PLEURAL SPACE W/IMG GUIDE  12/05/2021   IR US GUIDE VASC ACCESS LEFT  09/12/2020   IR US GUIDE VASC ACCESS RIGHT  05/25/2017   IR US GUIDE VASC ACCESS RIGHT  07/17/2021   LIGATION OF ARTERIOVENOUS  FISTULA Left 01/10/2021   Procedure: LIGATION OF LEFT ARM RADIOCEPHALIC FISTULA;  Surgeon: Serafina Mitchell, MD;  Location: [facility];  Service: Vascular;  Laterality: Left;   PERIPHERAL VASCULAR BALLOON ANGIOPLASTY Left 01/15/2021   Procedure: PERIPHERAL VASCULAR BALLOON ANGIOPLASTY;  Surgeon: Serafina Mitchell, MD;  Location: [place] [facility];  Service: Cardiovascular;  Laterality: Left;  AVF   REVISON OF ARTERIOVENOUS FISTULA Left 07/18/2019   Procedure: REVISION PLICATION OF RADIOCEPHALIC ARTERIOVENOUS FISTULA LEFT ARM;  Surgeon: Angelia Mould, MD;  Location: [place];  Service: Vascular;  Laterality: Left;   REVISON OF ARTERIOVENOUS FISTULA Left 01/10/2021   Procedure: CONVERSION TO BRACHIOCEPHALIC ARTERIOVENOUS FISTULA;  Surgeon: Serafina Mitchell, MD;  Location: [facility];  Service: Vascular;  Laterality: Left;   RINOPLASTY      Social History:  reports that he has never smoked. He has never used smokeless tobacco. He reports that he does not currently use alcohol. He reports that he does not use drugs.   Allergies  Allergen Reactions   Vancomycin Shortness Of Breath and Itching    Family History  Problem Relation Age of Onset   Cancer Mother     Family history reviewed and not pertinent    Prior to Admission medications   Medication Sig Start Date End Date Taking? Authorizing Provider  acetaminophen (TYLENOL) 500 MG tablet Take 1,000 mg by mouth every 6 (six) hours as needed for moderate pain (pain).   Yes [provider]  albuterol (VENTOLIN HFA) 108 (90 Base) MCG/ACT inhaler Inhale 2 puffs into the lungs 2 (two) times daily as needed for shortness of breath or wheezing. 06/01/21  Yes Oswald Hillock, MD  apixaban  (ELIQUIS) 2.5 MG TABS tablet Take 1 tablet (2.5 mg total) by mouth 2 (two) times daily. 11/23/21 01/18/22 Yes Arrien, Jimmy Picket, MD  B Complex Vitamins (B-COMPLEX/B-12) TABS Take 1 tablet by mouth daily. 11/25/21  Yes [provider]  Cholecalciferol (VITAMIN D3 PO) Take 1 capsule by mouth See admin instructions. Twice a week - [place]   Yes [provider]  diltiazem (CARDIZEM CD) 300 MG 24 hr capsule Take 1 capsule (300 mg total) by mouth daily. Patient taking differently: Take 300 mg by mouth every morning. 09/21/21  Yes Ghimire, Henreitta Leber, MD  feeding supplement (ENSURE ENLIVE / ENSURE PLUS) LIQD Take 237 mLs by mouth 2 (two) times daily between meals. Patient taking differently: Take 237 mLs by mouth daily. 09/13/21  Yes Elgergawy, Silver Huguenin, MD  fluticasone furoate-vilanterol (BREO ELLIPTA) 100-25 MCG/ACT AEPB Inhale 1 puff into the lungs daily as needed (For shortness of breath).   Yes [provider]  metoprolol tartrate (LOPRESSOR) 100 MG tablet Take 100 mg by mouth daily. 12/29/21  Yes [provider]  multivitamin (RENA-VIT) TABS tablet Take 1 tablet by mouth every morning. 01/12/18  Yes [provider]  oxyCODONE (OXY IR/ROXICODONE) 5 MG immediate release tablet Take 1 tablet (5 mg total) by mouth every 6 (six) hours as needed for severe pain. 12/06/21  Yes Arrien, Jimmy Picket, MD  sevelamer carbonate (RENVELA) 800 MG tablet Take 800 mg by mouth See admin instructions. Take one tablet (800 mg) by mouth three times daily - morning, evening and bedtime 05/31/21  Yes [provider]  cinacalcet (SENSIPAR) 60 MG tablet Take 1 tablet (60 mg total) by mouth every evening. Patient not taking: Reported on 01/09/2022 06/01/21   Oswald Hillock, MD  ethyl chloride spray Apply 1 application. topically 3 (three) times a week. Patient not taking: Reported on 01/18/2022 11/02/21   [provider]  midodrine (PROAMATINE) 10 MG tablet Take  10 mg by mouth 3 (three) times daily. Patient not taking: Reported on 01/09/2022    [provider]  nitroGLYCERIN (NITROSTAT) 0.4 MG SL tablet Place 1 tablet (0.4 mg total) under the tongue every 5 (five) minutes as needed for chest pain. 10/01/21   Aline August, MD     Objective    Physical Exam: Vitals:   01/18/22 0115 01/18/22 0145 01/18/22 0330 01/18/22 0337  BP: (!) 124/41 (!) 107/49 111/90 106/84  Pulse: 83 79 86 78  Resp: (!) 27 16 (!) 21 18  Temp:   98.1 F (36.7 C) 97.9 F (36.6 C)  TempSrc:   Oral Oral  SpO2: (!) 86% (!) 89% (!) 89% 94%  Weight:   47.1 kg 47.6 kg  Height:   '5\' 8"'$  (1.727 m)     General: appears to be stated age; alert, oriented; increased work of breathing noted Skin: warm, dry, no rash Head:  AT/[place] Mouth:  Oral mucosa membranes appear moist, normal dentition Neck: supple; trachea midline Heart:  RRR; did not appreciate any M/R/G Lungs: CTAB, did not appreciate any wheezes, rales, or rhonchi Abdomen: + BS; soft, ND, NT Vascular: 2+ pedal pulses b/l; 2+ radial pulses b/l Extremities: no peripheral edema, no muscle wasting Neuro: strength and sensation intact in upper and lower extremities b/l     Labs on Admission: I have personally reviewed following labs and imaging studies  CBC: Recent Labs  Lab 01/27/2022 2047 01/13/2022 2104  WBC 5.3  --   NEUTROABS 3.9  --   HGB 11.1* 10.5*  HCT 35.3* 31.0*  MCV 97.5  --   PLT 153  --    Basic Metabolic Panel: Recent Labs  Lab 01/13/22 1153 01/11/2022 2047 01/12/2022 2104 01/16/2022 2310  NA 136 139 137  --   K 5.4* 4.9 4.8  --   CL 99 103  --   --   CO2 24 24  --   --   GLUCOSE 111* 115*  --   --   BUN 54* 39*  --   --   CREATININE 6.74* 4.78*  --   --   CALCIUM 8.6* 9.0  --   --   MG  --   --   --  2.4  PHOS 6.7*  --   --   --    GFR: Estimated Creatinine Clearance: 10.8 mL/min (A) (by C-G formula based on SCr of 4.78 mg/dL (H)). Liver Function Tests: Recent Labs  Lab  01/13/22 1153  01/21/2022 2047  AST  --  19  ALT  --  13  ALKPHOS  --  98  BILITOT  --  0.5  PROT  --  6.3*  ALBUMIN 2.4* 2.7*   No results for input(s): LIPASE, AMYLASE in the last 168 hours. No results for input(s): AMMONIA in the last 168 hours. Coagulation Profile: No results for input(s): INR, PROTIME in the last 168 hours. Cardiac Enzymes: No results for input(s): CKTOTAL, CKMB, CKMBINDEX, TROPONINI in the last 168 hours. BNP (last 3 results) No results for input(s): PROBNP in the last 8760 hours. HbA1C: No results for input(s): HGBA1C in the last 72 hours. CBG: Recent Labs  Lab 01/14/22 0818  GLUCAP 132*   Lipid Profile: No results for input(s): CHOL, HDL, LDLCALC, TRIG, CHOLHDL, LDLDIRECT in the last 72 hours. Thyroid Function Tests: No results for input(s): TSH, T4TOTAL, FREET4, T3FREE, THYROIDAB in the last 72 hours. Anemia Panel: No results for input(s): VITAMINB12, FOLATE, FERRITIN, TIBC, IRON, RETICCTPCT in the last 72 hours. Urine analysis:    Component Value Date/Time   COLORURINE YELLOW 05/24/2017 1729   APPEARANCEUR HAZY (A) 05/24/2017 1729   LABSPEC 1.011 05/24/2017 1729   PHURINE 5.0 05/24/2017 1729   GLUCOSEU 50 (A) 05/24/2017 1729   HGBUR SMALL (A) 05/24/2017 1729   BILIRUBINUR NEGATIVE 05/24/2017 1729   KETONESUR NEGATIVE 05/24/2017 1729   PROTEINUR 100 (A) 05/24/2017 1729   UROBILINOGEN 0.2 04/09/2015 1527   NITRITE NEGATIVE 05/24/2017 1729   LEUKOCYTESUR TRACE (A) 05/24/2017 1729    Radiological Exams on Admission: DG Chest Port 1 View  Result Date: 01/26/2022 CLINICAL DATA:  Dyspnea EXAM: PORTABLE CHEST 1 VIEW COMPARISON:  01/13/2022 FINDINGS: Moderate to large right pleural effusion has increased in size since prior examination with compressive atelectasis of the right lung base. Superimposed diffuse ground-glass pulmonary infiltrate appears progressive within the perihilar regions, suspicious for alveolar pulmonary edema. No pneumothorax.  Right internal jugular hemodialysis catheter tip again noted within the superior vena cava. Mild cardiomegaly is stable. No acute bone abnormality. IMPRESSION: Progressive diffuse pulmonary infiltrate and enlarging, moderate to large right pleural effusion in keeping with probable changes of progressive cardiogenic failure. Electronically Signed   By: Fidela Salisbury M.D.   On: 01/01/2022 20:55     EKG: Independently reviewed, with result as described above.    Assessment/Plan   Principal Problem:   Acute on chronic respiratory failure with hypoxia (HCC) Active Problems:   Pleural effusion, right   Atypical chest pain   ESRD (end stage renal disease) (HCC)   Paroxysmal atrial flutter (HCC)     #) Acute on chronic hypoxic respiratory failure in the setting of recurrent right pleural effusion: Relative to baseline supplemental oxygen requirements of 4 L continuous nasal cannula, patient's initial O2 sats noted to be in the mid 80s, subsequent improving into the mid 90s following interval initiation of BiPAP, with settings as quantified above, with chest x-ray revealing resolution of right-sided pleural effusion.  This is relative to recent hospitalization in which he was admitted with right-sided pleural effusion, undergoing thoracentesis at that time, with associated painful 1.5 L.  Appears that he would benefit from additional therapeutic thoracentesis.  Will consult interventional radiology for this purpose.  Of note, ACS felt to be less likely given the atypical nature of his chest discomfort, with high sensitive troponin I consistent with his chronic range.  Acute pulmonary embolism less likely in the setting of chronic anticoagulation on Eliquis.  Plan: Monitor continuous pulse oximetry.  Continue BiPAP for now.  Check blood gas.  Consult placed with interventional radiology for therapeutic thoracentesis, as above.  Add on serum magnesium level and serum phosphorus level.  Pete CMP and CBC in  the morning.      #) Atypical chest pain: 1 day sharp substernal chest discomfort that appears pleuritic in nature nonexertional, and without improvement with nitroglycerin.  Appears consistent with atypical chest discomfort in the setting of reaccumulation of his right-sided pleural effusion.  ACS appears less likely, without any evidence of acute ischemic changes on EKG, We will high-sensitivity troponin I on par with his baseline chronic elevation.  Plan: Prn IV Dilaudid.  We will continue to trend troponin.  Monitor on telemetry.  IR consultation for therapeutic thoracentesis, as above.          #) ESRD: on HD (schedule: Monday, Wednesday, Friday). Next due for routine HD on Monday, 01/20/2022. No clinical evidence for urgent overnight HD or to expedite HD relative to this timeframe.  Attended full hemodialysis session on Friday, 01/21/2022.  Plan: monitor strict I's/O's, daily weights. CMP in the AM. Check mag and phos levels.           #) Paroxysmal atrial flutter: Documented history of such, on chronic anticoagulation via Eliquis.  AV nodal blocking regimen consists of Lopressor and diltiazem.  Presenting chest x-ray demonstrates sinus rhythm without overt evidence of acute ischemic changes.  Plan: Continue chronic anticoagulation on Eliquis as well as home AV nodal blocking regimen.  Add on serum magnesium level.  Monitor on telemetry.     DVT prophylaxis: SCD's plus home Eliquis Code Status: Per my discussions with the patient he wishes to be DNR but would be amenable to intubation in the setting a primary respiratory indication for such in a non-cardiac arrest setting Family Communication: none Disposition Plan: Per Rounding Team Consults called: none;  Admission status: Inpatient   PLEASE NOTE THAT DRAGON DICTATION SOFTWARE WAS USED IN THE CONSTRUCTION OF THIS NOTE.   [place] DO [facility]  From [place]   01/18/2022, 6:16 AM

## 2022-01-18 NOTE — Assessment & Plan Note (Signed)
 #)   Atypical chest pain: 1 day sharp substernal chest discomfort that appears pleuritic in nature nonexertional, and without improvement with nitroglycerin.  Appears consistent with atypical chest discomfort in the setting of reaccumulation of his right-sided pleural effusion.  ACS appears less likely, without any evidence of acute ischemic changes on EKG, We will high-sensitivity troponin I on par with his baseline chronic elevation.  Plan: Prn IV Dilaudid.  We will continue to trend troponin.  Monitor on telemetry.  IR consultation for therapeutic thoracentesis, as above.

## 2022-01-19 ENCOUNTER — Encounter (HOSPITAL_COMMUNITY): Payer: Self-pay | Admitting: Internal Medicine

## 2022-01-19 DIAGNOSIS — J9621 Acute and chronic respiratory failure with hypoxia: Secondary | ICD-10-CM | POA: Diagnosis not present

## 2022-01-19 DIAGNOSIS — E875 Hyperkalemia: Secondary | ICD-10-CM | POA: Diagnosis not present

## 2022-01-19 DIAGNOSIS — I4892 Unspecified atrial flutter: Secondary | ICD-10-CM | POA: Diagnosis not present

## 2022-01-19 DIAGNOSIS — N186 End stage renal disease: Secondary | ICD-10-CM | POA: Diagnosis not present

## 2022-01-19 LAB — BASIC METABOLIC PANEL
Anion gap: 12 (ref 5–15)
BUN: 54 mg/dL — ABNORMAL HIGH (ref 8–23)
CO2: 24 mmol/L (ref 22–32)
Calcium: 8.5 mg/dL — ABNORMAL LOW (ref 8.9–10.3)
Chloride: 99 mmol/L (ref 98–111)
Creatinine, Ser: 5.94 mg/dL — ABNORMAL HIGH (ref 0.61–1.24)
GFR, Estimated: 10 mL/min — ABNORMAL LOW (ref >60)
Glucose, Bld: 108 mg/dL — ABNORMAL HIGH (ref 70–99)
Potassium: 6.4 mmol/L (ref 3.5–5.1)
Sodium: 135 mmol/L (ref 135–145)

## 2022-01-19 LAB — POTASSIUM: Potassium: 5.8 mmol/L — ABNORMAL HIGH (ref 3.5–5.1)

## 2022-01-19 LAB — GLUCOSE, CAPILLARY: Glucose-Capillary: 68 mg/dL — ABNORMAL LOW (ref 70–99)

## 2022-01-19 MED ORDER — DEXTROSE 50 % IV SOLN
50.0000 mL | Freq: Once | INTRAVENOUS | Status: AC
  Administered 2022-01-19: 50 mL via INTRAVENOUS
  Filled 2022-01-19: qty 50

## 2022-01-19 MED ORDER — DEXTROSE 50 % IV SOLN
1.0000 | Freq: Once | INTRAVENOUS | Status: AC
  Administered 2022-01-19: 50 mL via INTRAVENOUS
  Filled 2022-01-19: qty 50

## 2022-01-19 MED ORDER — SODIUM ZIRCONIUM CYCLOSILICATE 10 G PO PACK
10.0000 g | PACK | Freq: Three times a day (TID) | ORAL | Status: AC
  Administered 2022-01-19 – 2022-01-20 (×3): 10 g via ORAL
  Filled 2022-01-19 (×7): qty 1

## 2022-01-19 MED ORDER — SODIUM ZIRCONIUM CYCLOSILICATE 10 G PO PACK
10.0000 g | PACK | Freq: Three times a day (TID) | ORAL | Status: DC
  Administered 2022-01-19: 10 g via ORAL
  Filled 2022-01-19 (×2): qty 1

## 2022-01-19 MED ORDER — CHLORHEXIDINE GLUCONATE CLOTH 2 % EX PADS: 6.0000 | MEDICATED_PAD | Freq: Every day | CUTANEOUS | Status: DC

## 2022-01-19 MED ORDER — INSULIN ASPART 100 UNIT/ML IV SOLN
10.0000 [IU] | Freq: Once | INTRAVENOUS | Status: AC
  Administered 2022-01-19: 10 [IU] via INTRAVENOUS

## 2022-01-19 MED ORDER — CALCIUM GLUCONATE-NACL 1-0.675 GM/50ML-% IV SOLN
1.0000 g | Freq: Once | INTRAVENOUS | Status: AC
  Administered 2022-01-19: 1000 mg via INTRAVENOUS
  Filled 2022-01-19: qty 50

## 2022-01-19 MED ORDER — SODIUM ZIRCONIUM CYCLOSILICATE 10 G PO PACK
10.0000 g | PACK | Freq: Three times a day (TID) | ORAL | Status: DC
  Filled 2022-01-19: qty 1

## 2022-01-19 NOTE — Progress Notes (Signed)
[facility] Progress Note   Subjective:   asked to see patient here recently who is now back w/ a/c hypoxic resp failure w/ recurrent R effusion. He was rx'd w/ Bipap on 5/19 and on 5/20 had another R thoracentesis done by IR.  Breathing is better now. K+ was high 5s yest and despitedlokelma this am is up to 6.4. We are asked to see for ESRD.    Pt seen in room, no c/o today.  Generalized pain issues. No SOB or cough.   Objective Vitals:   01/19/22 0803 01/19/22 0911 01/19/22 1313 01/19/22 1320  BP: 108/66 124/61    Pulse: 68     Resp:      Temp: (!) 97.4 F (36.3 C)     TempSrc: Oral     SpO2: 90%  (!) 84% 90%  Weight:      Height:       Physical Exam General:chronically ill appearing, cachetic male in NAD Heart:RRR, +1/6 systolic murmur Lungs: R lung dec'd at base, L side clear Abdomen:soft, NTND Extremities:no LE or UE edema Dialysis Access: [facility]  Dialysis Orders:   OP HD: SW MWF  3.5h  400/800   44kg  2/2 bath  TDC  Hep none - last HD 5/19 48.3 > 48.3kg - last Hb 11.6 on 4/24 - mircera 225 ug q2, last 5/10, due 5/24 - 01/13/22 > HBsAg neg w/ Abs < 10    Assessment/Plan: A/C hypoxic resp faliure - sp R thoracentesis 5/20 w/ improved resp status. No vol overload by exam.  Volume - up 4-6kg by weights. Large UF 3 L w/ next HD.  Hyperkalemia: 6.4 this am, repeat this afternoon is down to 5.8. No need  for urgent HD tonight, plan HD 1st shift tomorrow.  ESRD: on MWF HD. HD tomorrow.   Hypertension/volume: no distress, chronic changes by CXR.  R sided pleural effusion: S/p thoracentesis 5/20 by IR.  Recent chest pain: seen by cardiology.  Not thought to be ischemic.  Likely d/t volume overload, pleural effusion &/or severe pulmonary HTN.  NOT a candidate for invasive procedure Anemia esrd: Hgb 10.6, follow, ESA due on 5/24 as above  Metabolic bone disease: CorrCa improved, not on VDRA. Using low calcium bath and resume home sensipar. Phos a bit high cont  renvela as binder.  Nutrition:  Albumin 2.4, continue Nepro supps, Pro-source  Rob Jonnie Finner, MD 01/19/2022, 3:40 PM    Recent Labs  Lab 01/13/22 1153 01/13/22 1153 01/28/2022 2047 01/13/2022 2104 01/18/22 0541 01/19/22 0309 01/19/22 1441  HGB  --    < > 11.1* 10.5* 10.6*  --   --   ALBUMIN 2.4*  --  2.7*  --  2.5*  --   --   CALCIUM 8.6*  --  9.0  --  8.9 8.5*  --   PHOS 6.7*  --   --   --  6.6*  --   --   CREATININE 6.74*  --  4.78*  --  5.08* 5.94*  --   K 5.4*  --  4.9 4.8 5.5* 6.4* 5.8*   < > = values in this interval not displayed.   Inpatient medications:  apixaban  2.5 mg Oral BID   Chlorhexidine Gluconate Cloth  6 each Topical Daily   diltiazem  300 mg Oral q morning   feeding supplement (NEPRO CARB STEADY)  237 mL Oral TID WC   hydrocerin   Topical BID   metoprolol tartrate  100 mg Oral  Daily   sodium zirconium cyclosilicate  10 g Oral TID    acetaminophen **OR** acetaminophen, albuterol, HYDROmorphone (DILAUDID) injection, LORazepam, naLOXone (NARCAN)  injection

## 2022-01-19 NOTE — Plan of Care (Signed)
  Problem: Clinical Measurements: Goal: Will remain free from infection Outcome: Progressing Goal: Respiratory complications will improve Outcome: Progressing Goal: Cardiovascular complication will be avoided Outcome: Progressing   Problem: Nutrition: Goal: Adequate nutrition will be maintained Outcome: Progressing

## 2022-01-19 NOTE — Progress Notes (Signed)
Pt refused BiPAP tonight states if he wants the BiPAP he will call.

## 2022-01-19 NOTE — Progress Notes (Signed)
PROGRESS NOTE    Thomas Robinson  YQI:[id number] DOB: 03/24/1961 DOA: 01/16/2022 PCP: [name], PA   Chief Complaint  Patient presents with   Chest Pain    Brief Narrative:   Thomas Robinson is a 61 y.o. male with medical history significant for incisional disease on hemodialysis on Monday, Wednesday, Friday, chronic hypoxic respiratory failure on 2 to 4 L continuous supplemental oxygen via nasal cannula, chronic diastolic heart failure, paroxysmal atrial flutter chronically anticoagulated on Eliquis, who is admitted to [facility] on 01/25/2022 with acute on chronic hypoxic respiratory failure in the setting of recurrent right pleural effusion after presenting from home to [facility] ED complaining of chest pain.  -Work-up significant for recurrent large right pleural effusion.  Assessment & Plan:   Principal Problem:   Acute on chronic respiratory failure with hypoxia (HCC) Active Problems:   Pleural effusion, right   Atypical chest pain   ESRD (end stage renal disease) (HCC)   Paroxysmal atrial flutter (HCC)  Acute on chronic hypoxic respiratory failure in the setting of recurrent right pleural effusion: -On 4 L nasal cannula oxygen at baseline, he is requiring up to 10 L on admission, this has improved after thoracentesis yesterday, he is on 6 L nasal cannula this morning. -This is secondary to recurrent right pleural effusion, hopefully his oxygen requirements will improve after thoracentesis, he was encouraged with incentive spirometry and flutter valve.  recurrent right pleural effusion -Transudative, most likely in the setting of renal failure -Status post thoracentesis today with 1.6 L drained -Recent work-up in the past significant for transudative effusion, and otology negative for malignancy  Atypical chest pain -Due to above, consistent with atypical chest pain, no ischemic changes and EKG,  ESRD -Monday Wednesday Friday schedule, no indication for emergent  dialysis currently, will consult renal if he remains stable Monday.  Paroxysmal a flutter -Continue with Eliquis -Continue with home medications  Severe protein calorie malnutrition -We started on Nepro  Hyperkalemia -Potassium trending up to 6.4 today despite receiving Lokelma, have increased his Lokelma, received some gluconate with D50, and IV insulin . D/W renal, he will be dialyzed this evening    DVT prophylaxis: Eliquis Code Status: Full Family Communication: none at bedside Disposition:   Status is: Inpatient Remains inpatient appropriate because: increased oxygen requirment   Consultants:  IR   Subjective:  Still reports  generalized weakness, fatigue, dyspnea has improved  Objective: Vitals:   01/19/22 0803 01/19/22 0911 01/19/22 1313 01/19/22 1320  BP: 108/66 124/61    Pulse: 68     Resp:      Temp: (!) 97.4 F (36.3 C)     TempSrc: Oral     SpO2: 90%  (!) 84% 90%  Weight:      Height:        Intake/Output Summary (Last 24 hours) at 01/19/2022 1339 Last data filed at 01/19/2022 1128 Gross per 24 hour  Intake 236 ml  Output --  Net 236 ml   Filed Weights   01/18/22 0330 01/18/22 0337 01/19/22 0510  Weight: 47.1 kg 47.6 kg 50.7 kg    Examination:  Awake Alert, Oriented X 3, extremely frail, cachectic, deconditioned Symmetrical Chest wall movement, improved air entry at the right lung. RRR,No Gallops,Rubs or new Murmurs, No Parasternal Heave +ve B.Sounds, Abd Soft, No tenderness, No rebound - guarding or rigidity. No Cyanosis, Clubbing or edema, No new Rash or bruise      Data Reviewed: I have personally reviewed following  labs and imaging studies  CBC: Recent Labs  Lab 01/01/2022 2047 01/05/2022 2104 01/18/22 0541  WBC 5.3  --  4.3  NEUTROABS 3.9  --  3.0  HGB 11.1* 10.5* 10.6*  HCT 35.3* 31.0* 34.9*  MCV 97.5  --  98.3  PLT 153  --  132*    Basic Metabolic Panel: Recent Labs  Lab 01/13/22 1153 01/19/2022 2047 01/15/2022 2104  01/25/2022 2310 01/18/22 0541 01/19/22 0309  NA 136 139 137  --  137 135  K 5.4* 4.9 4.8  --  5.5* 6.4*  CL 99 103  --   --  101 99  CO2 24 24  --   --  24 24  GLUCOSE 111* 115*  --   --  107* 108*  BUN 54* 39*  --   --  44* 54*  CREATININE 6.74* 4.78*  --   --  5.08* 5.94*  CALCIUM 8.6* 9.0  --   --  8.9 8.5*  MG  --   --   --  2.4 2.4  --   PHOS 6.7*  --   --   --  6.6*  --     GFR: Estimated Creatinine Clearance: 9.2 mL/min (A) (by C-G formula based on SCr of 5.94 mg/dL (H)).  Liver Function Tests: Recent Labs  Lab 01/13/22 1153 01/01/2022 2047 01/18/22 0541  AST  --  19 20  ALT  --  13 14  ALKPHOS  --  98 95  BILITOT  --  0.5 0.5  PROT  --  6.3* 5.9*  ALBUMIN 2.4* 2.7* 2.5*    CBG: Recent Labs  Lab 01/14/22 0818  GLUCAP 132*     Recent Results (from the past 240 hour(s))  Body fluid culture w Gram Stain     Status: None   Collection Time: 01/10/22  1:36 PM   Specimen: Pleural Fluid  Result Value Ref Range Status   Specimen Description PLEURAL  Final   Special Requests NONE  Final   Gram Stain   Final    WBC PRESENT, PREDOMINANTLY PMN NO ORGANISMS SEEN CYTOSPIN SMEAR    Culture   Final    NO GROWTH Performed at [facility], [place] [address] [place], [place] [address]    Report Status 01/13/2022 FINAL  Final  Culture, blood (routine x 2)     Status: None (Preliminary result)   Collection Time: 01/21/2022  8:47 PM   Specimen: BLOOD  Result Value Ref Range Status   Specimen Description BLOOD BLOOD LEFT HAND  Final   Special Requests   Final    BOTTLES DRAWN AEROBIC AND ANAEROBIC Blood Culture results may not be optimal due to an inadequate volume of blood received in culture bottles   Culture   Final    NO GROWTH 2 DAYS Performed at [facility], [place] [address] [place], [place] [address]    Report Status PENDING  Incomplete  Culture, blood (routine x 2)     Status: None (Preliminary result)   Collection Time: 01/23/2022 11:28 PM   Specimen:  BLOOD  Result Value Ref Range Status   Specimen Description BLOOD SITE NOT SPECIFIED  Final   Special Requests   Final    BOTTLES DRAWN AEROBIC AND ANAEROBIC Blood Culture adequate volume   Culture   Final    NO GROWTH 2 DAYS Performed at [facility], [address] [place], [place] [address]    Report Status PENDING  Incomplete         Radiology Studies: DG Chest 1 View  Result Date: 01/18/2022 CLINICAL DATA:  Status post right thoracentesis EXAM: CHEST  1 VIEW COMPARISON:  01/03/2022 FINDINGS: There is interval decrease in amount of right pleural effusion. There is no pneumothorax. Small bilateral pleural effusions are seen, more so on the right side. Transverse diameter of heart is increased. There is decrease in pulmonary vascular congestion with residual prominence of central pulmonary vessels. There is improvement in aeration of parahilar regions suggesting decrease in pulmonary edema. No new focal infiltrates are seen. There is surgical fusion in the lower cervical spine. Tip of right IJ dialysis catheter is seen in the superior vena cava. IMPRESSION: Cardiomegaly. There is decrease in pulmonary vascular congestion. There is decrease in right pleural effusion. There is no pneumothorax. Electronically Signed   By: Elmer Picker M.D.   On: 01/18/2022 09:58   DG Chest Port 1 View  Result Date: 01/23/2022 CLINICAL DATA:  Dyspnea EXAM: PORTABLE CHEST 1 VIEW COMPARISON:  01/13/2022 FINDINGS: Moderate to large right pleural effusion has increased in size since prior examination with compressive atelectasis of the right lung base. Superimposed diffuse ground-glass pulmonary infiltrate appears progressive within the perihilar regions, suspicious for alveolar pulmonary edema. No pneumothorax. Right internal jugular hemodialysis catheter tip again noted within the superior vena cava. Mild cardiomegaly is stable. No acute bone abnormality. IMPRESSION: Progressive diffuse pulmonary  infiltrate and enlarging, moderate to large right pleural effusion in keeping with probable changes of progressive cardiogenic failure. Electronically Signed   By: Fidela Salisbury M.D.   On: 01/07/2022 20:55   US THORACENTESIS ASP PLEURAL SPACE W/IMG GUIDE  Result Date: 01/19/2022 INDICATION: Patient history of congestive heart failure, end-stage renal disease on hemodialysis, recurrent right pleural effusion. Request IR for therapeutic right thoracentesis. EXAM: ULTRASOUND GUIDED RIGHT THORACENTESIS MEDICATIONS: 8 mL 1% lidocaine COMPLICATIONS: None immediate. PROCEDURE: An ultrasound guided thoracentesis was thoroughly discussed with the patient and questions answered. The benefits, risks, alternatives and complications were also discussed. The patient understands and wishes to proceed with the procedure. Written consent was obtained. Ultrasound was performed to localize and mark an adequate pocket of fluid in the right chest. The area was then prepped and draped in the normal sterile fashion. 1% Lidocaine was used for local anesthesia. Under ultrasound guidance a 6 Fr Safe-T-Centesis catheter was introduced. Thoracentesis was performed. The catheter was removed and a dressing applied. FINDINGS: A total of approximately 1.6 L of clear yellow fluid was removed. Procedure aborted at this amount due to patient with persistent cough and discomfort, small amount of pleural fluid remains which is not amenable to repeat thoracentesis at this time. IMPRESSION: Successful ultrasound guided right thoracentesis yielding 1.6 L of pleural fluid. Read by Candiss Norse, PA-C Electronically Signed   By: Corrie Mckusick D.O.   On: 01/19/2022 07:56        Scheduled Meds:  apixaban  2.5 mg Oral BID   Chlorhexidine Gluconate Cloth  6 each Topical Daily   diltiazem  300 mg Oral q morning   feeding supplement (NEPRO CARB STEADY)  237 mL Oral TID WC   hydrocerin   Topical BID   metoprolol tartrate  100 mg Oral Daily    sodium zirconium cyclosilicate  10 g Oral TID   Continuous Infusions:   LOS: 2 days      Phillips Climes, MD [facility]   To contact the attending provider between 7A-7P or the covering provider during after  hours 7P-7A, please log into the web site [email or web address] and access using universal [place] password for that web site. If you do not have the password, please call the hospital operator.  01/19/2022, 1:39 PM

## 2022-01-19 NOTE — Progress Notes (Signed)
Some irritability. Last blood sugar 68. Given cranberry juice and crackers with peanut butter per patient request. Refused a following blood sugar check. States he does not want to be disturbed until morning. Turned down scheduled dose of Lokelma. States he has already had 2 or 3 packets today. Explained the importance of getting potassium level down (now 5.8) and avoiding heart arrythmias, but patient still refused. Also early refusal of scheduled morning CHG bath as it irritates his skin. Asks for phlebotomy morning blood draw to be performed later. O2 sats in the 80s to 90% on 7-10 liters HFNC but no complaints with breathing. Reports some pain after moving around in bed. Given PRN dilaudid. Patient satisfied and resting.

## 2022-01-19 NOTE — Progress Notes (Signed)
K+ 6.4, moderate hyperkalemia, reviewed previous EKG with peaked T wave.  Treated with IV insulin, D50, 1 g calcium gluconate, Lokelma increased to 10 g 3 times daily.

## 2022-01-19 NOTE — Progress Notes (Signed)
Pt refused CHG wipes stating they make his skin dry. RN asked if the eucerin cream helps with the dryness, pt stated yes but will use it tonight. RN educated purpose of CHG wipes with HD catheter and being able to use eucerin cream to hydrate skin after. Pt's response was "no".

## 2022-01-20 DIAGNOSIS — J9621 Acute and chronic respiratory failure with hypoxia: Secondary | ICD-10-CM | POA: Diagnosis not present

## 2022-01-20 DIAGNOSIS — R0789 Other chest pain: Secondary | ICD-10-CM | POA: Diagnosis not present

## 2022-01-20 DIAGNOSIS — J90 Pleural effusion, not elsewhere classified: Secondary | ICD-10-CM | POA: Diagnosis not present

## 2022-01-20 DIAGNOSIS — N186 End stage renal disease: Secondary | ICD-10-CM | POA: Diagnosis not present

## 2022-01-20 LAB — RENAL FUNCTION PANEL
Albumin: 2.2 g/dL — ABNORMAL LOW (ref 3.5–5.0)
Anion gap: 15 (ref 5–15)
BUN: 77 mg/dL — ABNORMAL HIGH (ref 8–23)
CO2: 21 mmol/L — ABNORMAL LOW (ref 22–32)
Calcium: 8.7 mg/dL — ABNORMAL LOW (ref 8.9–10.3)
Chloride: 98 mmol/L (ref 98–111)
Creatinine, Ser: 7.25 mg/dL — ABNORMAL HIGH (ref 0.61–1.24)
GFR, Estimated: 8 mL/min — ABNORMAL LOW (ref >60)
Glucose, Bld: 125 mg/dL — ABNORMAL HIGH (ref 70–99)
Phosphorus: 8.2 mg/dL — ABNORMAL HIGH (ref 2.5–4.6)
Potassium: 5.6 mmol/L — ABNORMAL HIGH (ref 3.5–5.1)
Sodium: 134 mmol/L — ABNORMAL LOW (ref 135–145)

## 2022-01-20 LAB — CBC
HCT: 30.9 % — ABNORMAL LOW (ref 39.0–52.0)
Hemoglobin: 9.9 g/dL — ABNORMAL LOW (ref 13.0–17.0)
MCH: 31.1 pg (ref 26.0–34.0)
MCHC: 32 g/dL (ref 30.0–36.0)
MCV: 97.2 fL (ref 80.0–100.0)
Platelets: 140 10*3/uL — ABNORMAL LOW (ref 150–400)
RBC: 3.18 MIL/uL — ABNORMAL LOW (ref 4.22–5.81)
RDW: 16.2 % — ABNORMAL HIGH (ref 11.5–15.5)
WBC: 6.1 10*3/uL (ref 4.0–10.5)
nRBC: 0 % (ref 0.0–0.2)

## 2022-01-20 LAB — TROPONIN I (HIGH SENSITIVITY): Troponin I (High Sensitivity): 98 ng/L — ABNORMAL HIGH (ref <18)

## 2022-01-20 MED ORDER — ALPRAZOLAM 0.25 MG PO TABS
0.2500 mg | ORAL_TABLET | Freq: Two times a day (BID) | ORAL | Status: DC | PRN
  Administered 2022-01-20 – 2022-01-22 (×5): 0.25 mg via ORAL
  Filled 2022-01-20 (×5): qty 1

## 2022-01-20 MED ORDER — HYDROMORPHONE HCL 1 MG/ML IJ SOLN
0.5000 mg | INTRAMUSCULAR | Status: DC | PRN
Start: 2022-01-20 — End: 2022-01-21
  Administered 2022-01-20 – 2022-01-21 (×3): 0.5 mg via INTRAVENOUS
  Filled 2022-01-20 (×3): qty 1

## 2022-01-20 MED ORDER — NITROGLYCERIN 0.4 MG SL SUBL
0.4000 mg | SUBLINGUAL_TABLET | SUBLINGUAL | Status: DC | PRN
  Administered 2022-01-20: 0.4 mg via SUBLINGUAL
  Filled 2022-01-20 (×2): qty 1

## 2022-01-20 NOTE — Progress Notes (Signed)
OT Cancellation Note  Patient Details Name: Thomas Robinson MRN: [id number] DOB: 1960-12-10   Cancelled Treatment:    Reason Eval/Treat Not Completed: Patient declined, no reason specified (pt requesting therapy come back tomorrow, resting, drowsy, and falling asleep when asked questions. RN reporting pt had Ativan 40 mins ago. Will follow up as schedule allows.)  Lynnda Child, OTD, OTR/L Acute Rehab [phone] - [place] 01/20/2022, 2:43 PM

## 2022-01-20 NOTE — Progress Notes (Signed)
PT Cancellation Note  Patient Details Name: Erhardt Dada MRN: [id number] DOB: 1961/01/19   Cancelled Treatment:    Reason Eval/Treat Not Completed: Patient declined, no reason specified (pt stating pain and denies participation at this time)   Lamarr Lulas 01/20/2022, 9:36 AM Bayard Males, PT Acute Rehabilitation Services Pager: [phone] Office: [phone]

## 2022-01-20 NOTE — Progress Notes (Signed)
Per primary nurse, Reynaldo,RN, pt refusing dialysis at this time. Notified Dr. Jonnie Finner, per Dr. Jonnie Finner pt can be reevaluated for second shift if possible due to reports of chest pains.

## 2022-01-20 NOTE — Procedures (Signed)
Patient declined hemodialysis treatment this evening and that he has "made up his mind". He stated that he will have his treatment tomorrow instead. Primary nurse present. MD notified.

## 2022-01-20 NOTE — Progress Notes (Signed)
[facility] Progress Note   Subjective:  pt having chest pain this am and refusing dialysis for the time being. Said he will reconsider for 2nd shift depending on status of the CP.   Objective Vitals:   01/19/22 1554 01/19/22 1951 01/19/22 2117 01/20/22 0614  BP: 107/64 120/71  135/76  Pulse: 99 74  65  Resp: 18 (!) 23  13  Temp: 98.2 F (36.8 C) 97.7 F (36.5 C)  97.7 F (36.5 C)  TempSrc: Oral Oral  Oral  SpO2: 90% 90% 97% 93%  Weight:    50.7 kg  Height:       Physical Exam General:chronically ill appearing, cachetic male in NAD Heart:RRR, +1/6 systolic murmur Lungs: R lung dec'd at base, L side clear Abdomen:soft, NTND Extremities:no LE or UE edema Dialysis Access: [facility]  Dialysis Orders:   OP HD: SW MWF  3.5h  400/800   44kg  2/2 bath  TDC  Hep none - last HD 5/19 48.3 > 48.3kg - last Hb 11.6 on 4/24 - mircera 225 ug q2, last 5/10, due 5/24 - 01/13/22 > HBsAg neg w/ Abs < 10    Assessment/Plan: A/C hypoxic resp faliure - sp R thoracentesis 5/20 w/ improved resp status. No vol overload by exam but wts are up.  Volume - up 4-6kg by weights. Large UF 3 L w/ next HD.  Hyperkalemia: down to 5.8 last check Sunday afternoon. Refuses labs except on HD ESRD: on MWF HD. Pt is refusing HD this morning due to chest pain. Pt is a DNR. He says if he tries to do HD w/ the chest pain he will only last about "10 minutes on dialysis" before he will sign off. Will reassess late morning to see if he can do dialysis on 2nd shift.  Hypertension/volume: no distress, chronic changes by CXR.  R sided pleural effusion: S/p thoracentesis 5/20 by IR.  Recent chest pain: seen by cardiology.  Not thought to be ischemic, more likely due to vol ^, pleural effusions and severe pHTN. Had [place] at [facility] in 2021 which showed normal coronary arteries. Last ECHO per cardiology note 01/13/22 showed progressive valve disease w/ moderate AI and severe MAC w/ mod-severe MV stenosis. NOT a  candidate for invasive procedures at this point. Only option was medical Rx/ BB therapy to improve diastolic filling. Also hospice care was recommended.  Anemia esrd: Hgb 10.6, follow, ESA due on 1/09 as above Metabolic bone disease: CorrCa improved, not on VDRA. Using low calcium bath and resume home sensipar. Phos a bit high cont renvela as binder. Nutrition:  Albumin 2.4, continue Nepro supps, Pro-source DNR  [name], MD 01/20/2022, 9:24 AM    Recent Labs  Lab 01/13/22 1153 01/13/22 1153 01/23/2022 2047 01/19/2022 2104 01/18/22 0541 01/19/22 0309 01/19/22 1441  HGB  --    < > 11.1* 10.5* 10.6*  --   --   ALBUMIN 2.4*  --  2.7*  --  2.5*  --   --   CALCIUM 8.6*  --  9.0  --  8.9 8.5*  --   PHOS 6.7*  --   --   --  6.6*  --   --   CREATININE 6.74*  --  4.78*  --  5.08* 5.94*  --   K 5.4*  --  4.9 4.8 5.5* 6.4* 5.8*   < > = values in this interval not displayed.    Inpatient medications:  apixaban  2.5 mg Oral BID  Chlorhexidine Gluconate Cloth  6 each Topical Daily   Chlorhexidine Gluconate Cloth  6 each Topical Q0600   diltiazem  300 mg Oral q morning   feeding supplement (NEPRO CARB STEADY)  237 mL Oral TID WC   hydrocerin   Topical BID   metoprolol tartrate  100 mg Oral Daily   sodium zirconium cyclosilicate  10 g Oral TID    acetaminophen **OR** acetaminophen, albuterol, HYDROmorphone (DILAUDID) injection, LORazepam, naLOXone (NARCAN)  injection, nitroGLYCERIN

## 2022-01-20 NOTE — Progress Notes (Signed)
PT Cancellation Note  Patient Details Name: Thomas Robinson MRN: [id number] DOB: [birth year]   Cancelled Treatment:    Reason Eval/Treat Not Completed: Patient declined, no reason specified (pt again declined for 3x today. Stating need to eat lunch)   Leocadia Idleman B Fantasha Daniele 01/20/2022, 12:06 PM Bayard Males, PT Acute Rehabilitation Services Pager: [phone] Office: [phone]

## 2022-01-20 NOTE — Progress Notes (Signed)
PROGRESS NOTE    Thomas Robinson  YIR:[id number] DOB: 07/21/1961 DOA: 01/26/2022 PCP: [name], PA   Chief Complaint  Patient presents with   Chest Pain    Brief Narrative:   Thomas Robinson is a 61 y.o. male with medical history significant for incisional disease on hemodialysis on Monday, Wednesday, Friday, chronic hypoxic respiratory failure on 2 to 4 L continuous supplemental oxygen via nasal cannula, chronic diastolic heart failure, paroxysmal atrial flutter chronically anticoagulated on Eliquis, who is admitted to [facility] on 01/26/2022 with acute on chronic hypoxic respiratory failure in the setting of recurrent right pleural effusion after presenting from home to [facility] ED complaining of chest pain.  -Work-up significant for recurrent large right pleural effusion.  Assessment & Plan:   Principal Problem:   Acute on chronic respiratory failure with hypoxia (HCC) Active Problems:   Pleural effusion, right   Atypical chest pain   ESRD (end stage renal disease) (HCC)   Paroxysmal atrial flutter (HCC)  Acute on chronic hypoxic respiratory failure in the setting of recurrent right pleural effusion: -On 4 L nasal cannula oxygen at baseline, he is requiring up to 10 L on admission, this has improved after thoracentesis yesterday, he is on 6 L nasal cannula this morning. -This is secondary to recurrent right pleural effusion, hopefully his oxygen requirements will improve after thoracentesis, he was encouraged with incentive spirometry and flutter valve.  recurrent right pleural effusion -Transudative, most likely in the setting of renal failure -Status post thoracentesis today with 1.6 L drained -Recent work-up in the past significant for transudative effusion, and otology negative for malignancy  Atypical chest pain -Due to above, consistent with atypical chest pain, no ischemic changes and EKG, -Was complaining of chest pain earlier today, his EKG is nonacute,  his troponin is reassuring as it is lower than his baseline in the known setting of ESRD  ESRD -Monday Wednesday Friday schedule. -HD per renal, dialysis has been held earlier today due to his complaints of chest pain.  Paroxysmal a flutter -Continue with Eliquis -Continue with home medications  Severe protein calorie malnutrition -We started on Nepro  Hyperkalemia -Potassium trending up to 6.4 today despite receiving Lokelma, have increased his Lokelma, received some gluconate with D50, and IV insulin . D/W renal, he will be dialyzed this evening    DVT prophylaxis: Eliquis Code Status: Full Family Communication: none at bedside Disposition:   Status is: Inpatient Remains inpatient appropriate because: increased oxygen requirment   Consultants:  IR   Subjective:  Chest pain earlier today which resolved with nitro, he does report some anxiety  Objective: Vitals:   01/19/22 1951 01/19/22 2117 01/20/22 0614 01/20/22 1015  BP: 120/71  135/76 111/68  Pulse: 74  65 66  Resp: (!) '23  13 17  '$ Temp: 97.7 F (36.5 C)  97.7 F (36.5 C) 97.9 F (36.6 C)  TempSrc: Oral  Oral Oral  SpO2: 90% 97% 93% 93%  Weight:   50.7 kg   Height:        Intake/Output Summary (Last 24 hours) at 01/20/2022 1428 Last data filed at 01/20/2022 0900 Gross per 24 hour  Intake 944 ml  Output --  Net 944 ml   Filed Weights   01/18/22 0337 01/19/22 0510 01/20/22 0614  Weight: 47.6 kg 50.7 kg 50.7 kg    Examination:  Awake Alert, Oriented X 3, extremely frail, cachectic, deconditioned Symmetrical Chest wall movement, diminished air entry at right lung base RRR,No Gallops,Rubs  or new Murmurs, No Parasternal Heave +ve B.Sounds, Abd Soft, No tenderness, No rebound - guarding or rigidity. No Cyanosis, Clubbing or edema, No new Rash or bruise      Data Reviewed: I have personally reviewed following labs and imaging studies  CBC: Recent Labs  Lab 01/11/2022 2047 01/14/2022 2104  01/18/22 0541 01/20/22 0944  WBC 5.3  --  4.3 6.1  NEUTROABS 3.9  --  3.0  --   HGB 11.1* 10.5* 10.6* 9.9*  HCT 35.3* 31.0* 34.9* 30.9*  MCV 97.5  --  98.3 97.2  PLT 153  --  132* 140*    Basic Metabolic Panel: Recent Labs  Lab 01/09/2022 2047 01/29/2022 2104 01/05/2022 2310 01/18/22 0541 01/19/22 0309 01/19/22 1441 01/20/22 0944  NA 139 137  --  137 135  --  134*  K 4.9 4.8  --  5.5* 6.4* 5.8* 5.6*  CL 103  --   --  101 99  --  98  CO2 24  --   --  24 24  --  21*  GLUCOSE 115*  --   --  107* 108*  --  125*  BUN 39*  --   --  44* 54*  --  77*  CREATININE 4.78*  --   --  5.08* 5.94*  --  7.25*  CALCIUM 9.0  --   --  8.9 8.5*  --  8.7*  MG  --   --  2.4 2.4  --   --   --   PHOS  --   --   --  6.6*  --   --  8.2*    GFR: Estimated Creatinine Clearance: 7.6 mL/min (A) (by C-G formula based on SCr of 7.25 mg/dL (H)).  Liver Function Tests: Recent Labs  Lab 01/04/2022 2047 01/18/22 0541 01/20/22 0944  AST 19 20  --   ALT 13 14  --   ALKPHOS 98 95  --   BILITOT 0.5 0.5  --   PROT 6.3* 5.9*  --   ALBUMIN 2.7* 2.5* 2.2*    CBG: Recent Labs  Lab 01/14/22 0818 01/19/22 1829  GLUCAP 132* 68*     Recent Results (from the past 240 hour(s))  Culture, blood (routine x 2)     Status: None (Preliminary result)   Collection Time: 12/30/2021  8:47 PM   Specimen: BLOOD  Result Value Ref Range Status   Specimen Description BLOOD BLOOD LEFT HAND  Final   Special Requests   Final    BOTTLES DRAWN AEROBIC AND ANAEROBIC Blood Culture results may not be optimal due to an inadequate volume of blood received in culture bottles   Culture   Final    NO GROWTH 3 DAYS Performed at [facility], [place] [address], [place], [place] [address]    Report Status PENDING  Incomplete  Culture, blood (routine x 2)     Status: None (Preliminary result)   Collection Time: 01/02/2022 11:28 PM   Specimen: BLOOD  Result Value Ref Range Status   Specimen Description BLOOD SITE NOT SPECIFIED  Final    Special Requests   Final    BOTTLES DRAWN AEROBIC AND ANAEROBIC Blood Culture adequate volume   Culture   Final    NO GROWTH 3 DAYS Performed at [facility], [address] [place], [place] [address]    Report Status PENDING  Incomplete         Radiology Studies: No results found.  Scheduled Meds:  apixaban  2.5 mg Oral BID   Chlorhexidine Gluconate Cloth  6 each Topical Daily   Chlorhexidine Gluconate Cloth  6 each Topical Q0600   diltiazem  300 mg Oral q morning   feeding supplement (NEPRO CARB STEADY)  237 mL Oral TID WC   hydrocerin   Topical BID   metoprolol tartrate  100 mg Oral Daily   sodium zirconium cyclosilicate  10 g Oral TID   Continuous Infusions:   LOS: 3 days      Phillips Climes, MD [facility]   To contact the attending provider between 7A-7P or the covering provider during after hours 7P-7A, please log into the web site [email or web address] and access using universal [place] password for that web site. If you do not have the password, please call the hospital operator.  01/20/2022, 2:28 PM

## 2022-01-20 NOTE — Progress Notes (Signed)
PT Cancellation Note  Patient Details Name: Thomas Robinson MRN: [id number] DOB: February 04, 1961   Cancelled Treatment:    Reason Eval/Treat Not Completed: Patient declined, no reason specified (pt stating he needed to rest after eating breakfast and denied participation at this time)   Thomas Robinson 01/20/2022, 8:08 AM Bayard Males, [place] Pager: [phone] Office: [phone]

## 2022-01-21 ENCOUNTER — Inpatient Hospital Stay (HOSPITAL_COMMUNITY): Payer: Medicare Other

## 2022-01-21 DIAGNOSIS — N186 End stage renal disease: Secondary | ICD-10-CM | POA: Diagnosis not present

## 2022-01-21 DIAGNOSIS — J90 Pleural effusion, not elsewhere classified: Secondary | ICD-10-CM | POA: Diagnosis not present

## 2022-01-21 DIAGNOSIS — R0789 Other chest pain: Secondary | ICD-10-CM | POA: Diagnosis not present

## 2022-01-21 DIAGNOSIS — J9621 Acute and chronic respiratory failure with hypoxia: Secondary | ICD-10-CM | POA: Diagnosis not present

## 2022-01-21 IMAGING — CR DG CHEST 2V
3 series · 3 of 3 positions shown · non-contrast
Comparison: Chest x-ray 03/29/2021, CT chest 05/23/2020

CLINICAL DATA: Chest pain, weakness, hypoxia

EXAM:
CHEST - 2 VIEW

[chest lat (1 of 2)]
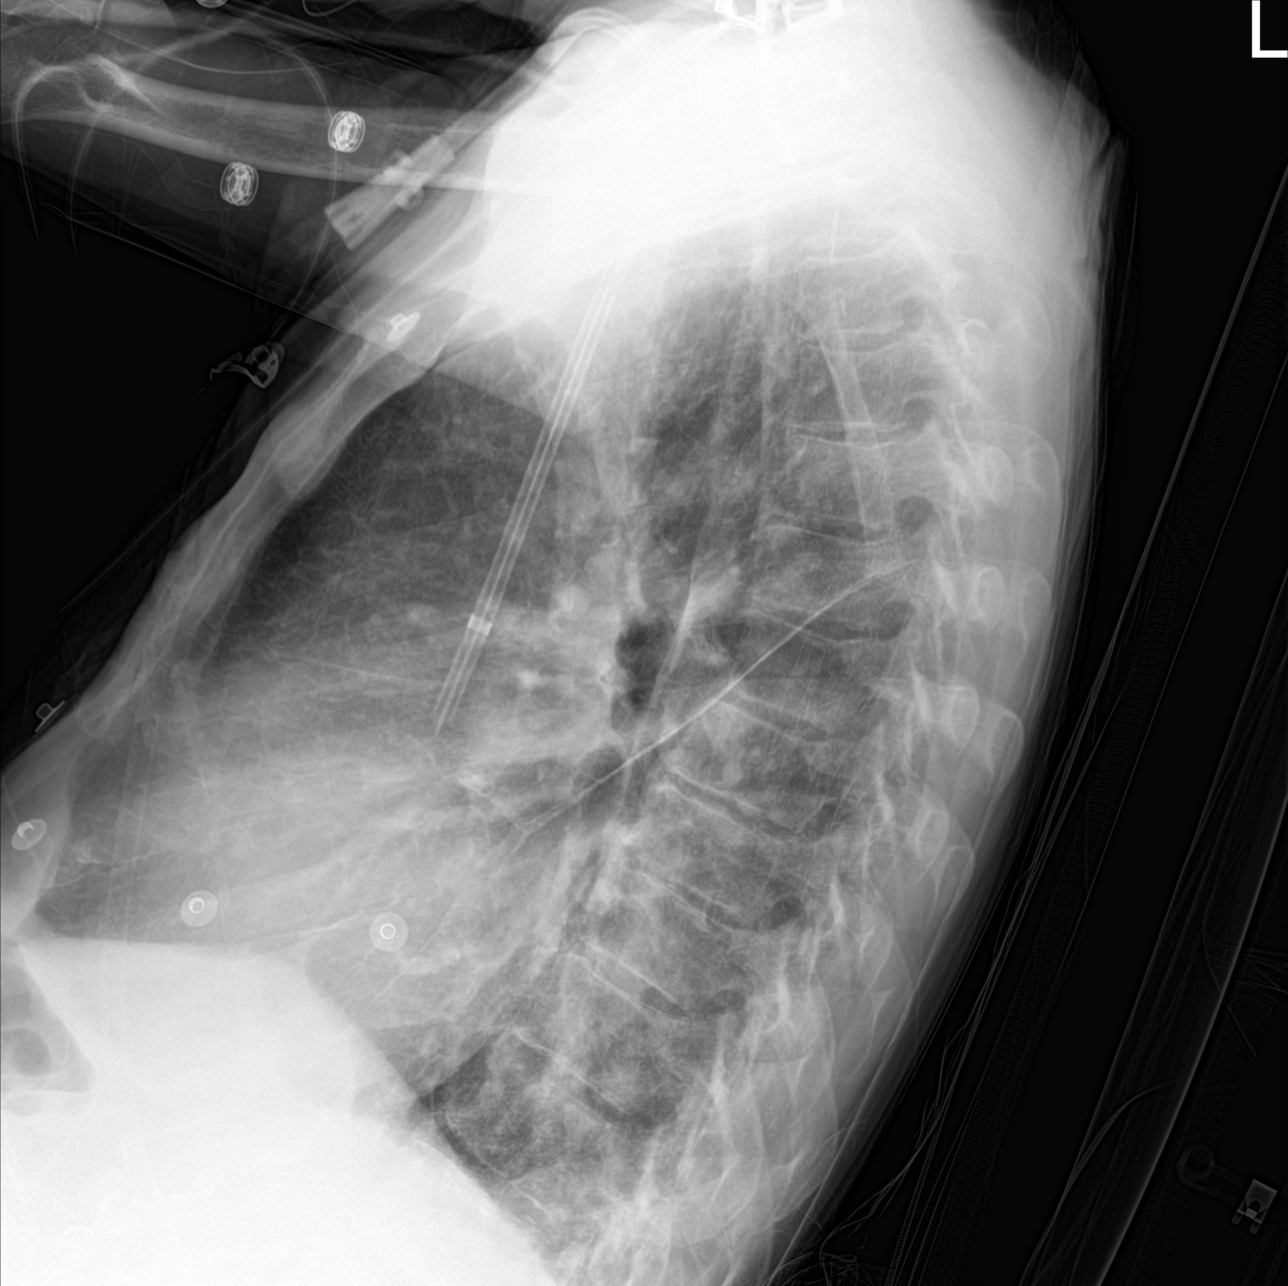

[chest lat (2 of 2)]
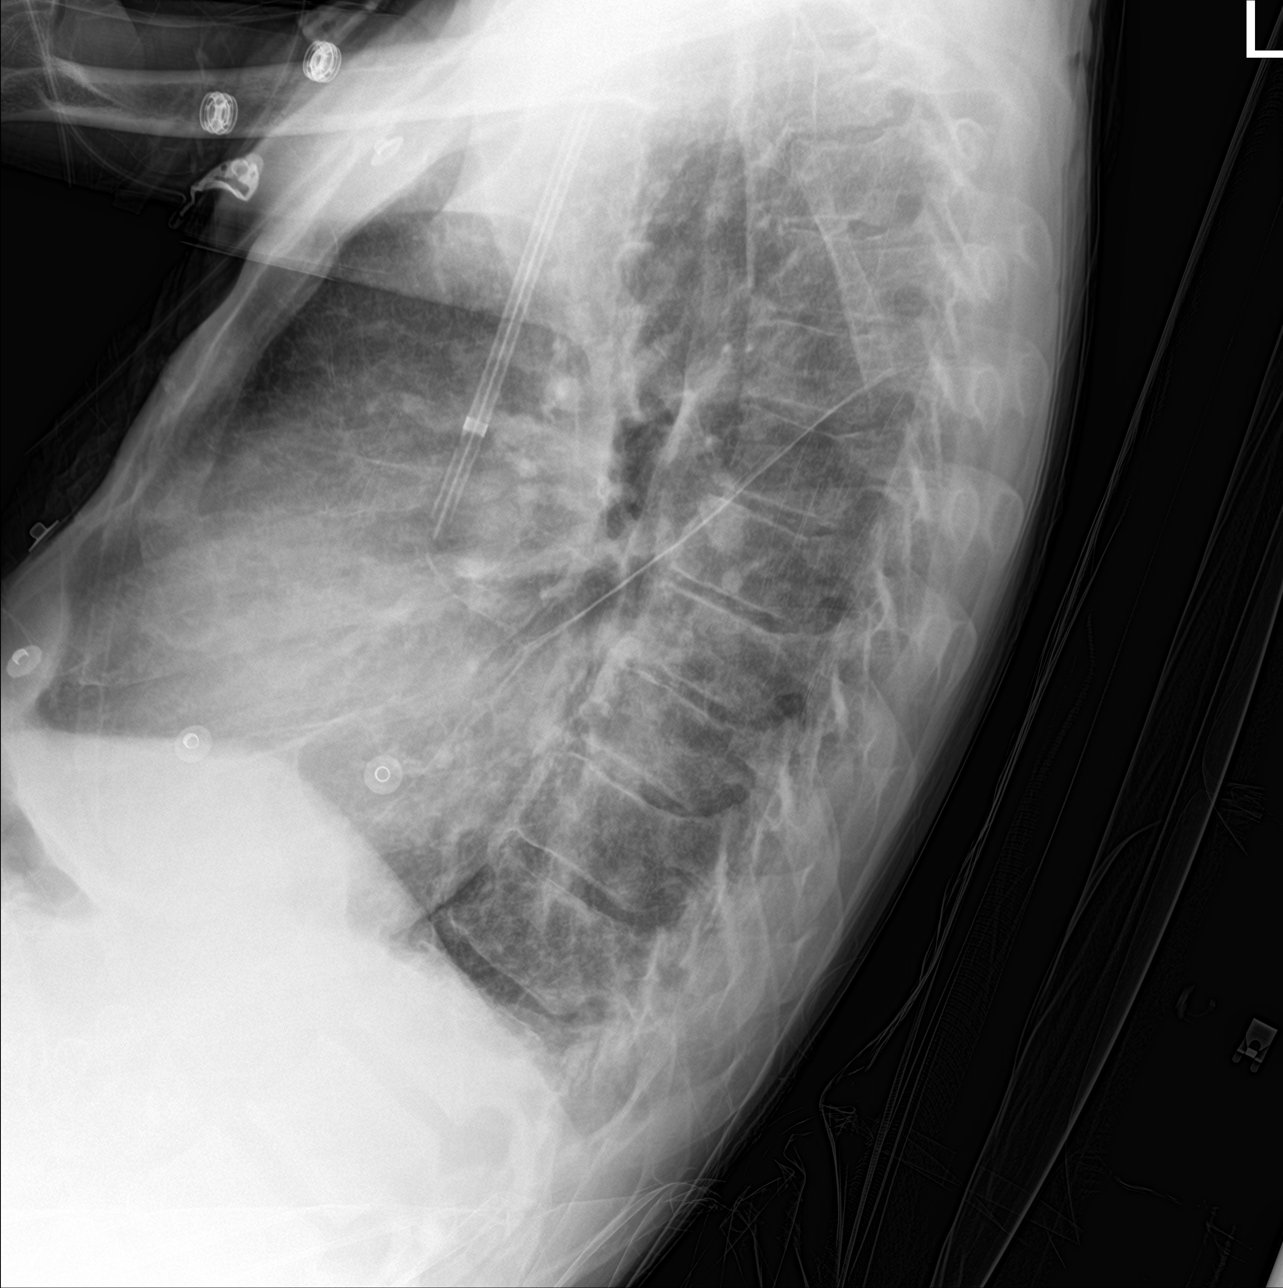

[chest ap]
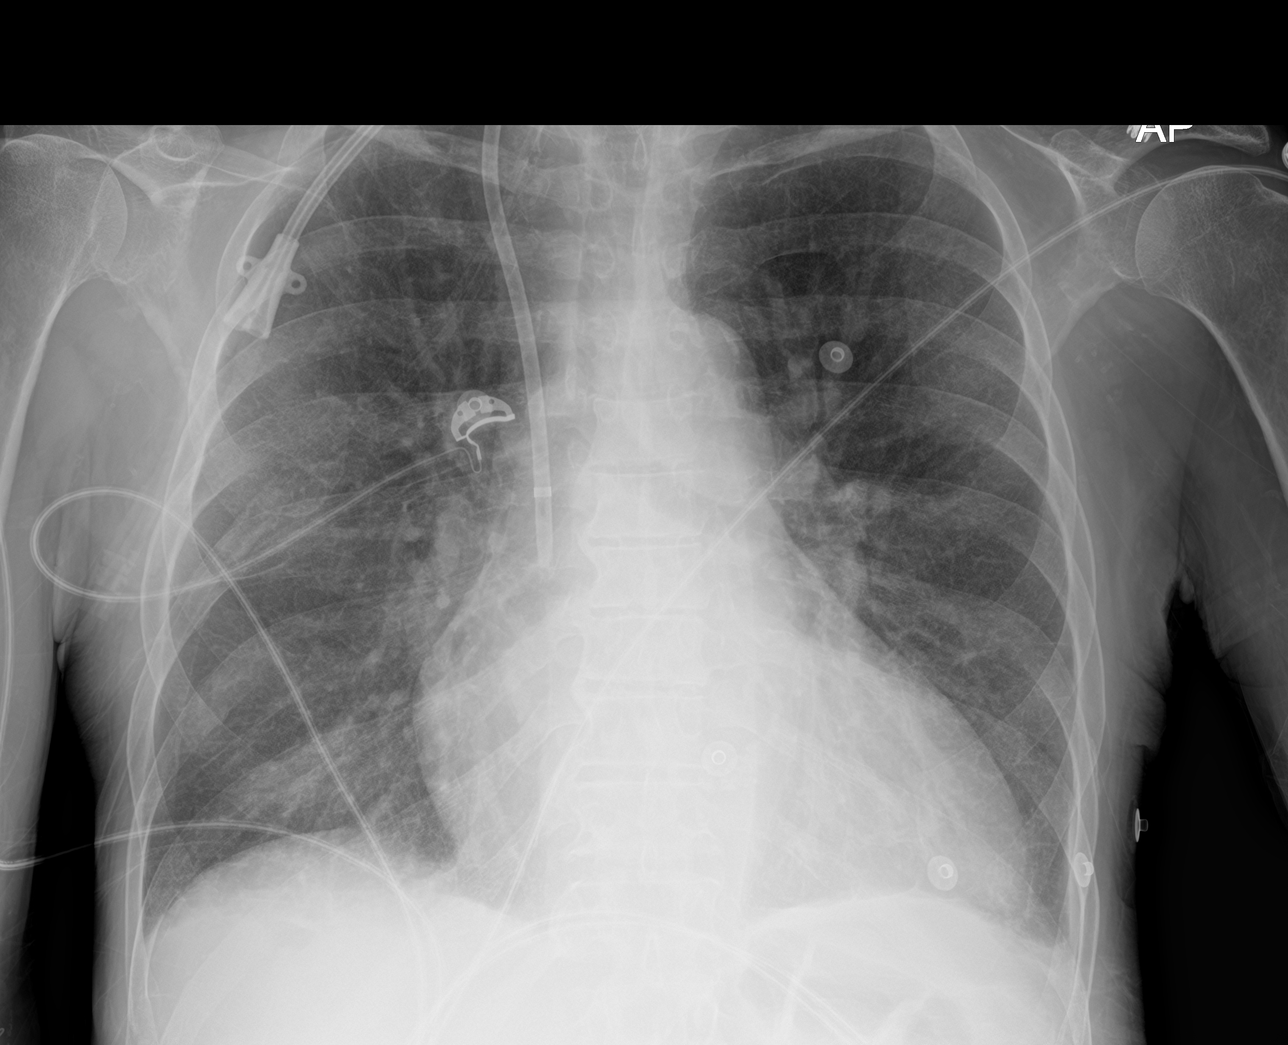

[3 of 3 positions shown; findings below may reference images not displayed]

FINDINGS: Right chest wall dialysis catheter with tip overlying the expected
region of the superior cavoatrial junction.

Persistent cardiomegaly. The heart and mediastinal contours are
unchanged. Aortic calcification. Slight prominence of the hilar
vasculature.

No focal consolidation. Slightly increased interstitial markings,
Kerley B lines. Blunting of bilateral costophrenic angles with
possible trace pleural effusions. No pneumothorax.

No acute osseous abnormality.
IMPRESSION: Mild pulmonary edema.  Trace pleural effusions.

## 2022-01-21 IMAGING — CT CT ANGIO CHEST
2 of 6 series · 18 of 36 positions shown · IV contrast (omnipaque)
Comparison: None.

CLINICAL DATA: PE suspected

EXAM:
CT ANGIOGRAPHY CHEST WITH CONTRAST
TECHNIQUE: Multidetector CT imaging of the chest was performed using the
standard protocol during bolus administration of intravenous
contrast. Multiplanar CT image reconstructions and MIPs were
obtained to evaluate the vascular anatomy.
CONTRAST:  50mL OMNIPAQUE IOHEXOL 350 MG/ML SOLN

[Series 7: pe thins · axial · 0.66mm/px · z∈[-257,+19]mm · 17 of 438 slices shown]
[im 22/438  lung]
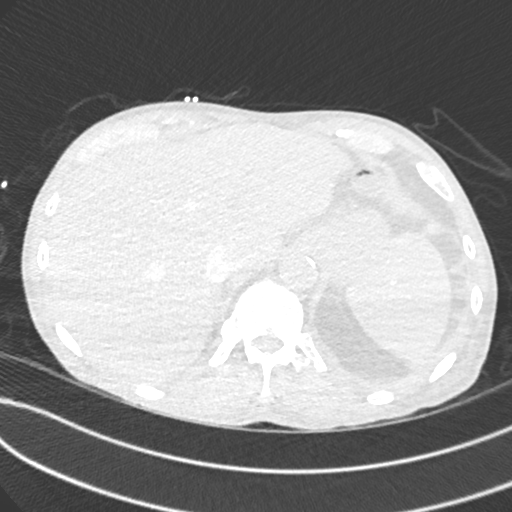
[im 44/438  mediastinal]
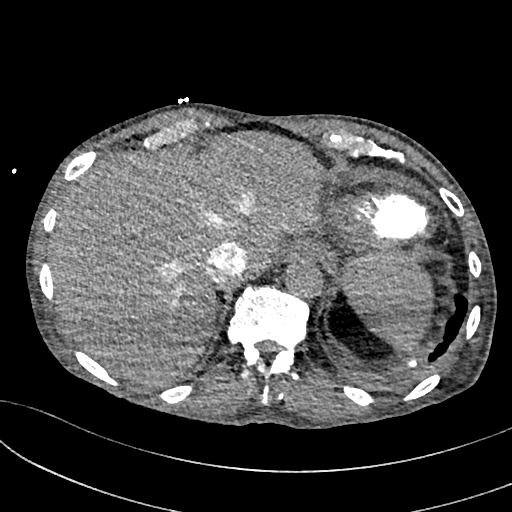
[im 66/438  lung]
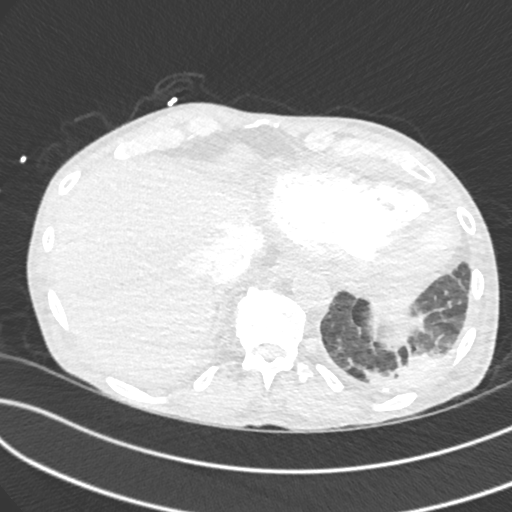
[im 88/438  mediastinal]
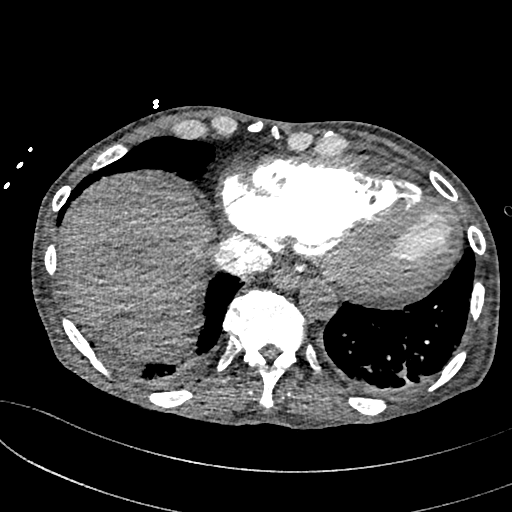
[im 132/438  lung]
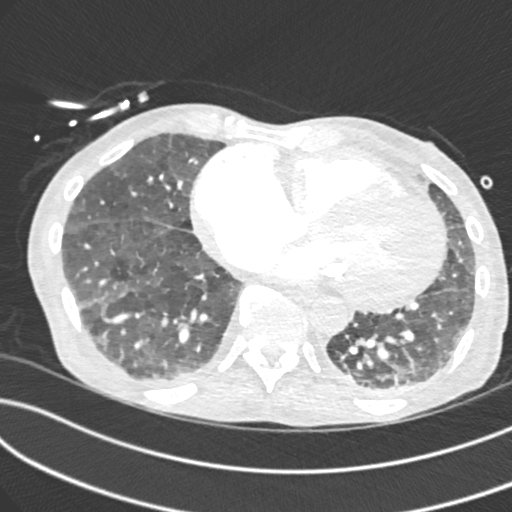
[im 153/438  mediastinal]
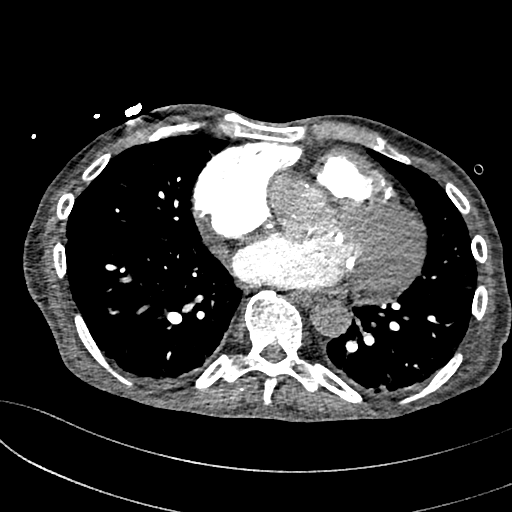
[im 175/438  lung]
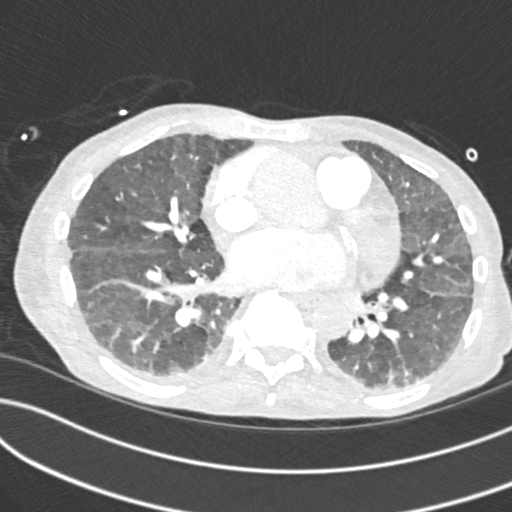
[im 197/438  mediastinal]
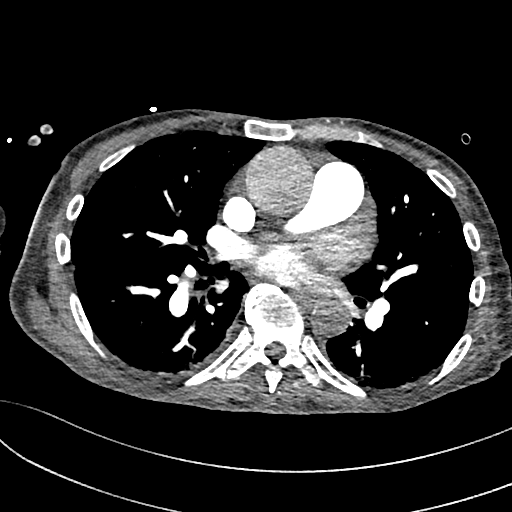
[im 219/438  lung]
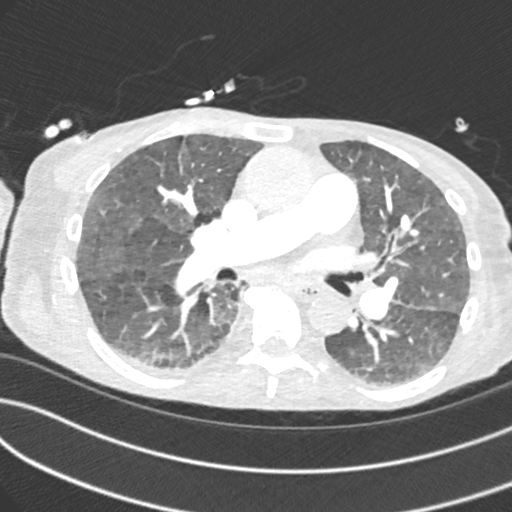
[im 241/438  mediastinal]
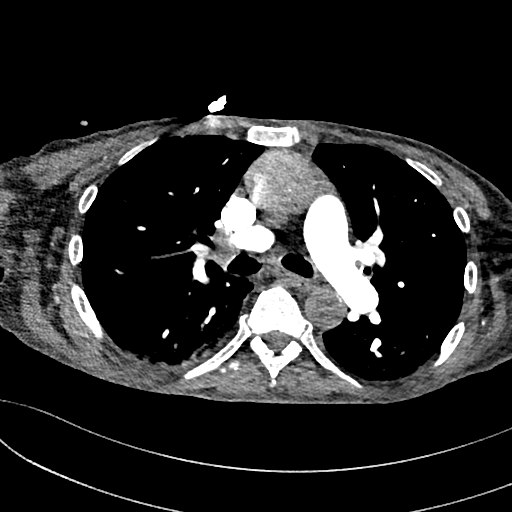
[im 263/438  lung]
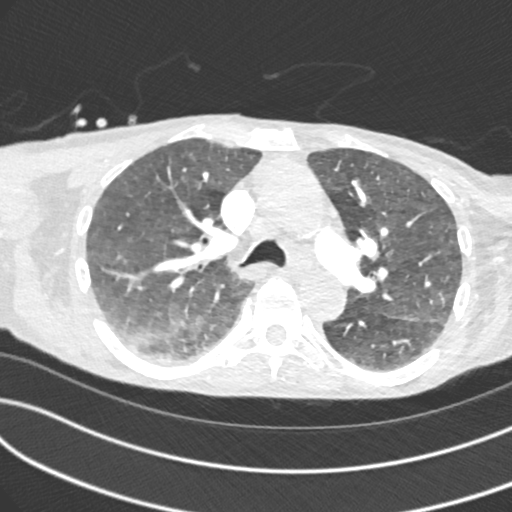
[im 285/438  mediastinal]
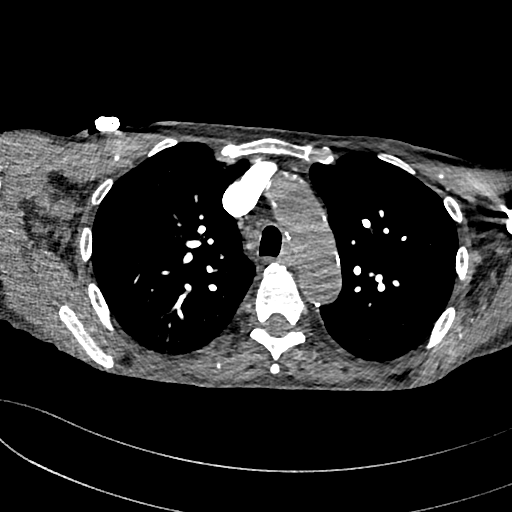
[im 306/438  lung]
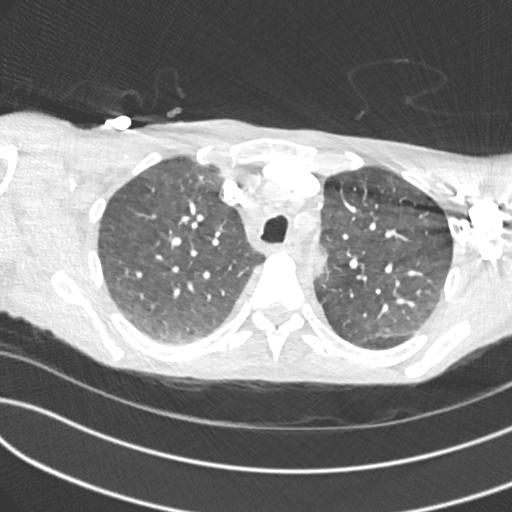
[im 350/438  mediastinal]
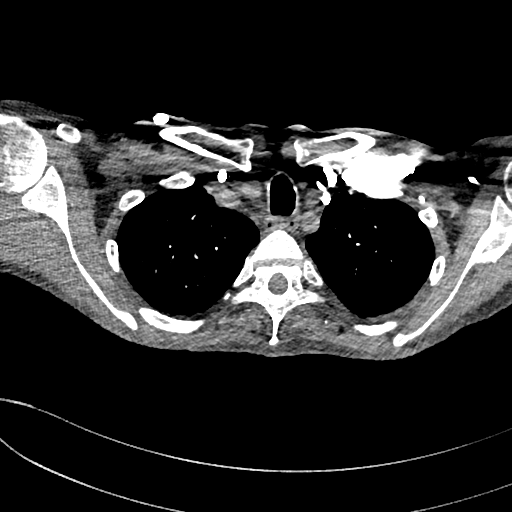
[im 372/438  lung]
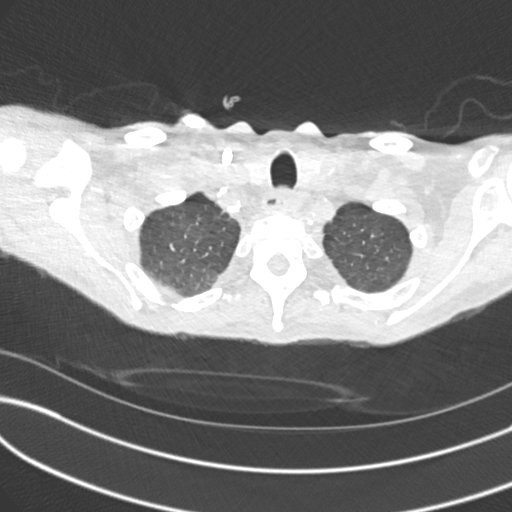
[im 394/438  mediastinal]
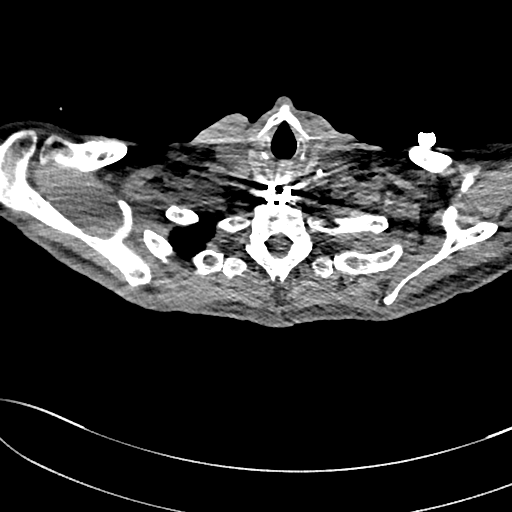
[im 416/438  lung]
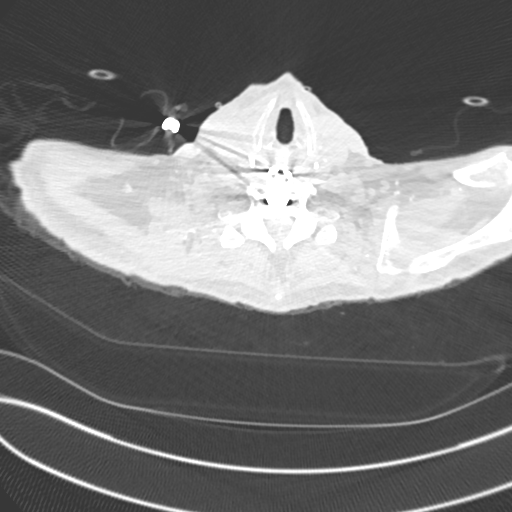

[Series 8: pe 2mm cor · coronal · 0.65mm/px · 1 of 149 slices shown]
[im 75/149  mediastinal]
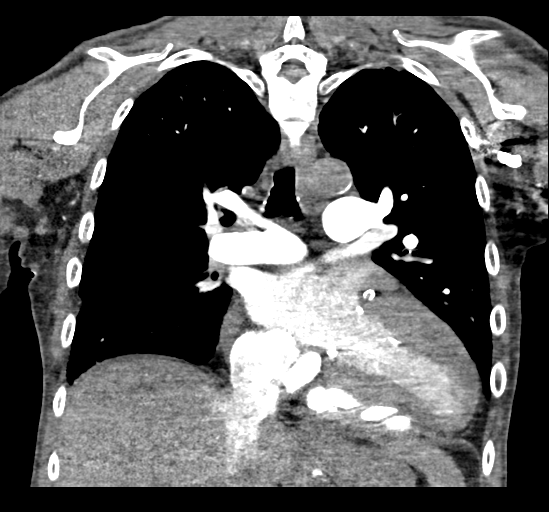

[18 of 36 positions shown; findings below may reference images not displayed]

FINDINGS: Cardiovascular: Satisfactory opacification of the pulmonary arteries
to the segmental level. No evidence of pulmonary embolism.
Cardiomegaly. Three-vessel coronary artery calcifications. No
pericardial effusion. Right neck large bore multi lumen vascular
catheter. Enlargement of the tubular ascending thoracic aorta,
measuring 4.6 x 4.5 cm.

Mediastinum/Nodes: No enlarged mediastinal, hilar, or axillary lymph
nodes. Thyroid gland, trachea, and esophagus demonstrate no
significant findings.

Lungs/Pleura: Diffuse bilateral bronchial wall thickening. Trace
bilateral pleural effusions associated atelectasis or consolidation.

Upper Abdomen: No acute abnormality.

Musculoskeletal: No chest wall abnormality. No acute or significant
osseous findings. Renal osteodystrophy.

Review of the MIP images confirms the above findings.
IMPRESSION: 1. Negative examination for pulmonary embolism.
2. Diffuse bilateral bronchial wall thickening, consistent with
nonspecific infectious or inflammatory bronchitis.
3. Trace bilateral pleural effusions and associated atelectasis or
consolidation.
4. Cardiomegaly and coronary artery disease.
5. Enlargement of the tubular ascending thoracic aorta, measuring
4.6 x 4.5 cm. Ascending thoracic aortic aneurysm. Recommend
semi-annual imaging followup by CTA or MRA and referral to
cardiothoracic surgery if not already obtained. This recommendation
follows 6020 ACCF/AHA/AATS/ACR/ASA/SCA/HONIVE/KANDACE/ROSADA/HIDETAKE Guidelines
for the Diagnosis and Management of Patients With Thoracic Aortic
Disease. Circulation. 6020; 121: [id number]. Aortic aneurysm NOS
([id number])

## 2022-01-21 MED ORDER — ALBUTEROL SULFATE (2.5 MG/3ML) 0.083% IN NEBU
2.5000 mg | INHALATION_SOLUTION | Freq: Four times a day (QID) | RESPIRATORY_TRACT | Status: DC | PRN
  Administered 2022-01-21 – 2022-01-22 (×2): 2.5 mg via RESPIRATORY_TRACT
  Filled 2022-01-21 (×2): qty 3

## 2022-01-21 MED ORDER — HEPARIN SODIUM (PORCINE) 1000 UNIT/ML IJ SOLN
INTRAMUSCULAR | Status: AC
  Filled 2022-01-21: qty 4

## 2022-01-21 MED ORDER — ALBUMIN HUMAN 25 % IV SOLN
25.0000 g | Freq: Once | INTRAVENOUS | Status: AC
  Filled 2022-01-21: qty 100

## 2022-01-21 MED ORDER — METOPROLOL TARTRATE 50 MG PO TABS
50.0000 mg | ORAL_TABLET | Freq: Every day | ORAL | Status: DC
  Administered 2022-01-22: 50 mg via ORAL
  Filled 2022-01-21: qty 1

## 2022-01-21 MED ORDER — ALBUMIN HUMAN 25 % IV SOLN
INTRAVENOUS | Status: AC
  Administered 2022-01-21: 25 g
  Filled 2022-01-21: qty 100

## 2022-01-21 MED ORDER — DILTIAZEM HCL ER COATED BEADS 240 MG PO CP24
240.0000 mg | ORAL_CAPSULE | Freq: Every morning | ORAL | Status: DC
  Administered 2022-01-22: 240 mg via ORAL
  Filled 2022-01-21: qty 1

## 2022-01-21 MED ORDER — HYDROCODONE-ACETAMINOPHEN 5-325 MG PO TABS: 1.0000 | ORAL_TABLET | Freq: Four times a day (QID) | ORAL | Status: DC | PRN

## 2022-01-21 MED ORDER — OXYCODONE HCL 5 MG PO TABS
5.0000 mg | ORAL_TABLET | Freq: Four times a day (QID) | ORAL | Status: DC | PRN
  Administered 2022-01-21 – 2022-01-22 (×4): 5 mg via ORAL
  Filled 2022-01-21 (×4): qty 1

## 2022-01-21 NOTE — Progress Notes (Signed)
Patient only ran treatment for 1 hour and 82mns, he wants to stop dialysis treatment early. Convince patient to stay longer for the treatment, but was adamant in going back to his room. Dr. PDicie Beamnotified. Early treatment sheet signed by patient.

## 2022-01-21 NOTE — Progress Notes (Signed)
[facility] Progress Note   Subjective:  pt refused HD again last evening. Today ^'d O2 needs and CXR showing recurrent R effusion and new pulm edema. Pt on HD now.   Objective Vitals:   01/21/22 1415 01/21/22 1430 01/21/22 1500 01/21/22 1530  BP: (!) 80/35 (!) 99/37 (!) 164/69 (!) 106/34  Pulse: (!) 55 (!) 55 69 61  Resp: (!) 21     Temp: 98.1 F (36.7 C)     TempSrc: Temporal     SpO2: 97%     Weight: 49.5 kg     Height:       Physical Exam General:chronically ill appearing, cachetic male in NAD Heart:RRR, +4/4 systolic murmur Lungs: R lung dec'd at base, L side clear Abdomen:soft, NTND Extremities: no LE or UE edema Dialysis Access: [facility]  Dialysis Orders:   OP HD: SW MWF  3.5h  400/800   44kg  2/2 bath  TDC  Hep none - last HD 5/19 48.3 > 48.3kg - last Hb 11.6 on 4/24 - mircera 225 ug q2, last 5/10, due 5/24 - 01/13/22 > HBsAg neg w/ Abs < 10    Assessment/Plan: A/C hypoxic resp faliure - sp R thoracentesis 5/20 w/ improved resp status. No vol overload by exam but wts are up.  Volume - up 4-6kg by weights. Large UF 3 L w/ HD today, will be difficult w/ BP's in the 80s- 90s.   Hyperkalemia: down to 5.6 last draw.  ESRD: on MWF HD. Pt is refusing HD again yesterday evening. Agrees to HD this am. Volume is becoming more and more difficult to control due to low BP's and frequent refusal of the procedure (mostly due to chest pain lately). Very poor prognosis. R sided pleural effusion: S/p thoracentesis 5/20 by IR.  Recent chest pain: seen by cardiology.  Not thought to be ischemic, more likely due to vol ^, pleural effusions and severe pHTN. Had Shirley at [facility] in 2021 which showed normal coronary arteries. Last ECHO per cardiology note 01/13/22 showed progressive valve disease w/ moderate AI and severe MAC w/ mod-severe MV stenosis. NOT a candidate for invasive procedures at this point. Only option was medical Rx/ BB therapy to improve diastolic filling. Also  hospice care was recommended.  Anemia esrd: Hgb 10.6, follow, ESA due on 9/75 as above Metabolic bone disease: CorrCa improved, not on VDRA. Using low calcium bath and resume home sensipar. Phos a bit high cont renvela as binder. Nutrition:  Albumin 2.4, continue Nepro supps, Pro-source DNR  [name], MD 01/21/2022, 3:48 PM    Recent Labs  Lab 01/18/22 0541 01/19/22 0309 01/19/22 1441 01/20/22 0944  HGB 10.6*  --   --  9.9*  ALBUMIN 2.5*  --   --  2.2*  CALCIUM 8.9 8.5*  --  8.7*  PHOS 6.6*  --   --  8.2*  CREATININE 5.08* 5.94*  --  7.25*  K 5.5* 6.4* 5.8* 5.6*    Inpatient medications:  apixaban  2.5 mg Oral BID   Chlorhexidine Gluconate Cloth  6 each Topical Daily   Chlorhexidine Gluconate Cloth  6 each Topical Q0600   [START ON 02/01/2022] diltiazem  240 mg Oral q morning   feeding supplement (NEPRO CARB STEADY)  237 mL Oral TID WC   hydrocerin   Topical BID   [START ON 2022-02-01] metoprolol tartrate  50 mg Oral Daily   sodium zirconium cyclosilicate  10 g Oral TID    albumin human  acetaminophen **OR** acetaminophen, albuterol, ALPRAZolam, naLOXone (NARCAN)  injection, nitroGLYCERIN, oxyCODONE

## 2022-01-21 NOTE — Plan of Care (Signed)

## 2022-01-21 NOTE — Progress Notes (Signed)
PT Cancellation Note  Patient Details Name: Thomas Robinson MRN: [id number] DOB: 07/14/1961   Cancelled Treatment:    Reason Eval/Treat Not Completed: Patient declined, no reason specified (pt reports waiting on HD and refuses mobility prior to HD. HD has not called to pick up pt yet and pt made aware yet refused and stated he will go home. Pt requested return tomorrow if he is here and pt aware of cancellation policy and will D/C P.T. if refuses tomorrow)   Sandy Salaam Emannuel Vise 01/21/2022, 12:27 PM Bayard Males, PT [facility] Pager: [phone] Office: [phone]

## 2022-01-21 NOTE — Progress Notes (Signed)
PROGRESS NOTE    Thomas Robinson  KPV:[id number] DOB: 03-Oct-1960 DOA: 01/19/2022 PCP: [name], PA   Chief Complaint  Patient presents with   Chest Pain    Brief Narrative:   Thomas Robinson is a 61 y.o. male with medical history significant for incisional disease on hemodialysis on Monday, Wednesday, Friday, chronic hypoxic respiratory failure on 2 to 4 L continuous supplemental oxygen via nasal cannula, chronic diastolic heart failure, paroxysmal atrial flutter chronically anticoagulated on Eliquis, who is admitted to [facility] on 01/14/2022 with acute on chronic hypoxic respiratory failure in the setting of recurrent right pleural effusion after presenting from home to [facility] ED complaining of chest pain.  -Work-up significant for recurrent large right pleural effusion, worsening hypoxemia and oxygen requirement, he required thoracentesis 1.6 L with significant improvement of his symptoms, patient with extreme noncompliance with medical recommendations, he declines dialysis multiple times, as well with PT/OT, and multiple lab works, as of 5/23 he did not receive dialysis yet due to multiple refusals, as well this morning he is again requiring increased oxygen requirement and appears to be in volume overload.  Assessment & Plan:   Principal Problem:   Acute on chronic respiratory failure with hypoxia (HCC) Active Problems:   Pleural effusion, right   Atypical chest pain   ESRD (end stage renal disease) (HCC)   Paroxysmal atrial flutter (HCC)  Acute on chronic hypoxic respiratory failure in the setting of recurrent right pleural effusion and pulmonary edema. -On 4 L nasal cannula oxygen at baseline, initially requiring up to 10 L nasal cannula, this has improved after thoracentesis . -He is with worsening hypoxia today, up to 14 L nasal cannula this morning, with significant, imaging significant for recurrent pleural effusion and pulmonary edema . -Patient will go for HD  and likely will need repeat thoracentesis . -Use incentive spirometer.  recurrent right pleural effusion -Transudative, most likely in the setting of renal failure -Status post thoracentesis with 1.6 L drained on 5/20 -Recent work-up in the past significant for transudative effusion, and cytology negative for malignancy -Appears to be with recurrent pleural effusion, due to noncompliance with HD, will need repeat thoracentesis. -I have discussed with the patient about Pleurx cath given his frequent admission and recurrent of pleural effusion, but he declined.  Atypical chest pain -Due to above, consistent with atypical chest pain, no ischemic changes and EKG, -Was complaining of chest pain earlier today, his EKG is nonacute, his troponin is reassuring as it is lower than his baseline in the known setting of ESRD  ESRD -Monday Wednesday Friday schedule. -Has refused HD multiple times this admission.  Paroxysmal a flutter -Continue with Eliquis -Heart rate on the lower side, blood pressure is soft as well, I will decrease his metoprolol by 50%, and Cardizem from 360>>240 daily.  Severe protein calorie malnutrition -We started on Nepro  Hyperkalemia -Managed with Lokelma  Goals of care/noncompliance -Patient with severe noncompliance, he has been refusing a lot of hospital care, he did refuse HD yesterday, currently is agreeing to it, he has refused labs in the past, he did refuse with PT/OT, has been refusing medications, I have discussed with the patient at length, noncompliance, and refusal of treatment certainly putting his life at jeopardy.      DVT prophylaxis: Eliquis Code Status: Full Family Communication: none at bedside Disposition:   Status is: Inpatient Remains inpatient appropriate because: increased oxygen requirment   Consultants:  renal   Subjective:  Reported dyspnea,  with increased oxygen requirement this morning up to 14 L.  Objective: Vitals:    01/20/22 1957 01/21/22 0417 01/21/22 0832 01/21/22 0903  BP: (!) 152/96 139/82  107/66  Pulse: 73     Resp: 20 20    Temp: 98 F (36.7 C) (!) 97.5 F (36.4 C)    TempSrc: Oral Oral    SpO2: 90% 96% 97%   Weight:      Height:        Intake/Output Summary (Last 24 hours) at 01/21/2022 1352 Last data filed at 01/21/2022 0400 Gross per 24 hour  Intake 180 ml  Output 0 ml  Net 180 ml   Filed Weights   01/18/22 0337 01/19/22 0510 01/20/22 0614  Weight: 47.6 kg 50.7 kg 50.7 kg    Examination:  Awake Alert, Oriented X 3, extremely frail, cachectic, deconditioned Diminished air entry bilaterally, more significantly in the right lung, scattered crackles Regular rate and rhythm, no rubs or gallops Abdomen is scaphoid, bowel sounds present Extremities with no edema, significant muscle wasting.    Data Reviewed: I have personally reviewed following labs and imaging studies  CBC: Recent Labs  Lab 12/30/2021 2047 01/15/2022 2104 01/18/22 0541 01/20/22 0944  WBC 5.3  --  4.3 6.1  NEUTROABS 3.9  --  3.0  --   HGB 11.1* 10.5* 10.6* 9.9*  HCT 35.3* 31.0* 34.9* 30.9*  MCV 97.5  --  98.3 97.2  PLT 153  --  132* 140*    Basic Metabolic Panel: Recent Labs  Lab 01/04/2022 2047 01/23/2022 2104 01/06/2022 2310 01/18/22 0541 01/19/22 0309 01/19/22 1441 01/20/22 0944  NA 139 137  --  137 135  --  134*  K 4.9 4.8  --  5.5* 6.4* 5.8* 5.6*  CL 103  --   --  101 99  --  98  CO2 24  --   --  24 24  --  21*  GLUCOSE 115*  --   --  107* 108*  --  125*  BUN 39*  --   --  44* 54*  --  77*  CREATININE 4.78*  --   --  5.08* 5.94*  --  7.25*  CALCIUM 9.0  --   --  8.9 8.5*  --  8.7*  MG  --   --  2.4 2.4  --   --   --   PHOS  --   --   --  6.6*  --   --  8.2*    GFR: Estimated Creatinine Clearance: 7.6 mL/min (A) (by C-G formula based on SCr of 7.25 mg/dL (H)).  Liver Function Tests: Recent Labs  Lab 01/29/2022 2047 01/18/22 0541 01/20/22 0944  AST 19 20  --   ALT 13 14  --   ALKPHOS  98 95  --   BILITOT 0.5 0.5  --   PROT 6.3* 5.9*  --   ALBUMIN 2.7* 2.5* 2.2*    CBG: Recent Labs  Lab 01/19/22 1829  GLUCAP 68*     Recent Results (from the past 240 hour(s))  Culture, blood (routine x 2)     Status: None (Preliminary result)   Collection Time: 01/04/2022  8:47 PM   Specimen: BLOOD  Result Value Ref Range Status   Specimen Description BLOOD BLOOD LEFT HAND  Final   Special Requests   Final    BOTTLES DRAWN AEROBIC AND ANAEROBIC Blood Culture results may not be optimal due to an inadequate volume of blood  received in culture bottles   Culture   Final    NO GROWTH 4 DAYS Performed at [facility], [place] [address], [place], [place] [address]    Report Status PENDING  Incomplete  Culture, blood (routine x 2)     Status: None (Preliminary result)   Collection Time: 01/29/2022 11:28 PM   Specimen: BLOOD  Result Value Ref Range Status   Specimen Description BLOOD SITE NOT SPECIFIED  Final   Special Requests   Final    BOTTLES DRAWN AEROBIC AND ANAEROBIC Blood Culture adequate volume   Culture   Final    NO GROWTH 4 DAYS Performed at [facility], [address] [place], [place] [address]    Report Status PENDING  Incomplete         Radiology Studies: DG Chest Port 1 View  Result Date: 01/21/2022 CLINICAL DATA:  Pleural effusion.  Shortness of breath. EXAM: PORTABLE CHEST 1 VIEW COMPARISON:  01/18/2022 FINDINGS: Central line remains in place with its tip at the SVC RA junction. Enlarged cardiac silhouette as seen previously. Diffuse interstitial pulmonary edema as seen previously. Enlarging effusions on both sides with associated atelectasis, worse on the right than the left. IMPRESSION: Radiographic worsening. Persistent interstitial edema. Enlarging effusions on both sides with dependent atelectasis, right worse than left. Electronically Signed   By: Nelson Chimes M.D.   On: 01/21/2022 10:51        Scheduled Meds:  apixaban  2.5 mg Oral BID    Chlorhexidine Gluconate Cloth  6 each Topical Daily   Chlorhexidine Gluconate Cloth  6 each Topical Q0600   diltiazem  300 mg Oral q morning   feeding supplement (NEPRO CARB STEADY)  237 mL Oral TID WC   hydrocerin   Topical BID   metoprolol tartrate  100 mg Oral Daily   sodium zirconium cyclosilicate  10 g Oral TID   Continuous Infusions:   LOS: 4 days      Phillips Climes, MD [facility]   To contact the attending provider between 7A-7P or the covering provider during after hours 7P-7A, please log into the web site [email or web address] and access using universal [place] password for that web site. If you do not have the password, please call the hospital operator.  01/21/2022, 1:52 PM

## 2022-01-21 NOTE — Progress Notes (Unsigned)
Office Note     CC:  *** Requesting Provider:  [name], PA  HPI: Thomas Robinson is a 61 y.o. (Mar 10, 1961) male presenting at the request of .[place], [place], [place] ***  Preious BB - didn't mature Patient suffered cardiopulmonary arrest after preoperative block.  ROSC achieved after several minutes of CPR. Epinephrine administered x1. Patient intubated.  Bedside ultrasound shows no evidence of pneumothorax. CXR pending. Will transfer to ICU for post arrest care. Will plan to do RUE AVG as outpatient.  The pt is *** on a statin for cholesterol management.  The pt is *** on a daily aspirin.   Other AC:  *** The pt is *** on medication for hypertension.   The pt is *** diabetic.  Tobacco hx:  ***  Past Medical History:  Diagnosis Date   Anaphylactic shock, unspecified, sequela 06/10/2019   Aortic insufficiency    Aortic stenosis    Arthritis    Asthma, chronic, unspecified asthma severity, with acute exacerbation 10/23/2017   CAP (community acquired pneumonia) 10/23/2017   Carpal tunnel syndrome of right wrist 10/05/2018   Cataract    right - removed by surgery   Cerebral infarction due to thrombosis of cerebral artery (HCC)    Cervical disc herniation 01/04/2020   Chronic diastolic heart failure ([place]) 04/13/2018   Chronic low back pain 11/24/2019   Constipation    COPD (chronic obstructive pulmonary disease) (HCC)    Cough    chronic cough   Encounter for immunization 07/08/2017   ESRD on hemodialysis ([place]) 02/16/2018   Fall 06/04/2019   GERD (gastroesophageal reflux disease) 06/04/2019   GI bleeding 05/24/2017   Gram-negative sepsis, unspecified ([place]) 09/05/2017   History of fusion of cervical spine 03/26/2020   Hyperlipidemia    Hypertension    Hypotension    Iron deficiency anemia, unspecified 09/09/2017   Left shoulder pain 11/11/2019   Lumbar radiculopathy 11/24/2019   Lung contusion 06/04/2019   LVH (left ventricular hypertrophy) due to hypertensive  disease, with heart failure ([place]) 06/18/2020   Macrocytic anemia 05/24/2017   Mitral regurgitation    Moderate protein-calorie malnutrition (HCC) 06/06/2017   Myofascial pain syndrome 06/12/2020   Neuritis of right ulnar nerve 09/13/2018   Non-compliance with renal dialysis 02/08/2020   Oxygen deficiency 12/30/2019   O2 sats on RA 87% at PAT appt    Pain in joint of right elbow 09/13/2018   Paroxysmal atrial flutter (HCC)    PEA (Pulseless electrical activity) ([place])    after sedation in 07/2021   Pulmonary hypertension ([place])    Renovascular hypertension 06/22/2020   Restless leg syndrome    Rib fractures 06/2019   Right   S/P cardiac cath 08/27/2020   normal coronary arteries.   Secondary hyperparathyroidism of renal origin ([place]) 06/04/2017   Thoracic ascending aortic aneurysm [facility])    Tricuspid regurgitation    Ulnar neuropathy 10/05/2018    Past Surgical History:  Procedure Laterality Date   A/V FISTULAGRAM N/A 01/01/2021   Procedure: A/V FISTULAGRAM;  Surgeon: Serafina Mitchell, MD;  Location: [facility];  Service: Cardiovascular;  Laterality: N/A;   A/V FISTULAGRAM N/A 01/15/2021   Procedure: A/V [id number];  Surgeon: Serafina Mitchell, MD;  Location: [facility];  Service: Cardiovascular;  Laterality: N/A;   ANTERIOR CERVICAL DECOMP/DISCECTOMY FUSION N/A 01/04/2020   Procedure: ANTERIOR CERVICAL DECOMPRESSION/DISCECTOMY FUSION CERVICAL FIVE THROUGH SEVEN;  Surgeon: Melina Schools, MD;  Location: [facility];  Service: Orthopedics;  Laterality: N/A;  3  hrs   AV FISTULA PLACEMENT Left 05/28/2017   Procedure: LEFT ARM ARTERIOVENOUS (AV) FISTULA CREATION;  Surgeon: Conrad [place], MD;  Location: [facility];  Service: Vascular;  Laterality: Left;   AV FISTULA PLACEMENT Left 02/02/2021   Procedure: Debride left forearm, ligation of left upper arm Aretiovenous fistula;  Surgeon: Elam Dutch, MD;  Location: [facility];  Service: Vascular;  Laterality: Left;   AV FISTULA PLACEMENT Right  04/04/2021   Procedure: RIGHT ARTERIOVENOUS (AV) FISTULA CREATION;  Surgeon: Serafina Mitchell, MD;  Location: [facility];  Service: Vascular;  Laterality: Right;   BIOPSY  07/10/2021   Procedure: BIOPSY;  Surgeon: Lavena Bullion, DO;  Location: [facility] [place] [facility];  Service: Gastroenterology;;   COLONOSCOPY WITH PROPOFOL N/A 07/10/2021   Procedure: COLONOSCOPY WITH PROPOFOL;  Surgeon: Lavena Bullion, DO;  Location: [facility];  Service: Gastroenterology;  Laterality: N/A;   ESOPHAGOGASTRODUODENOSCOPY (EGD) WITH PROPOFOL N/A 07/10/2021   Procedure: ESOPHAGOGASTRODUODENOSCOPY (EGD) WITH PROPOFOL;  Surgeon: Lavena Bullion, DO;  Location: [facility];  Service: Gastroenterology;  Laterality: N/A;   EYE SURGERY Right 06/02/2019   Cataract removed   FRACTURE SURGERY     INSERTION OF DIALYSIS CATHETER Right 02/02/2021   Procedure: INSERTION OF Right Internal jugular TUNNELED  DIALYSIS CATHETER;  Surgeon: Elam Dutch, MD;  Location: [facility];  Service: Vascular;  Laterality: Right;   IR DIALY SHUNT INTRO NEEDLE/INTRACATH INITIAL W/IMG LEFT Left 09/12/2020   IR FLUORO GUIDE CV LINE RIGHT  05/25/2017   IR FLUORO GUIDE CV LINE RIGHT  07/17/2021   IR THORACENTESIS ASP PLEURAL SPACE W/IMG GUIDE  09/11/2021   IR THORACENTESIS ASP PLEURAL SPACE W/IMG GUIDE  12/05/2021   IR US GUIDE VASC ACCESS LEFT  09/12/2020   IR US GUIDE VASC ACCESS RIGHT  05/25/2017   IR US GUIDE VASC ACCESS RIGHT  07/17/2021   LIGATION OF ARTERIOVENOUS  FISTULA Left 01/10/2021   Procedure: LIGATION OF LEFT ARM RADIOCEPHALIC FISTULA;  Surgeon: Serafina Mitchell, MD;  Location[facility];  Service: Vascular;  Laterality: Left;   PERIPHERAL VASCULAR BALLOON ANGIOPLASTY Left 01/15/2021   Procedure: PERIPHERAL VASCULAR BALLOON ANGIOPLASTY;  Surgeon: Serafina Mitchell, MD;  Location: [facility];  Service: Cardiovascular;  Laterality: Left;  AVF   REVISON OF ARTERIOVENOUS FISTULA Left 07/18/2019   Procedure: REVISION PLICATION OF RADIOCEPHALIC  ARTERIOVENOUS FISTULA LEFT ARM;  Surgeon: Angelia Mould, MD;  Location: [facility];  Service: Vascular;  Laterality: Left;   REVISON OF ARTERIOVENOUS FISTULA Left 01/10/2021   Procedure: CONVERSION TO BRACHIOCEPHALIC ARTERIOVENOUS FISTULA;  Surgeon: Serafina Mitchell, MD;  Location: [facility];  Service: Vascular;  Laterality: Left;   RINOPLASTY      Social History   Socioeconomic History   Marital status: Single    Spouse name: Not on file   Number of children: Not on file   Years of education: Not on file   Highest education level: Not on file  Occupational History   Not on file  Tobacco Use   Smoking status: Never   Smokeless tobacco: Never  Vaping Use   Vaping Use: Never used  Substance and Sexual Activity   Alcohol use: Not Currently    Alcohol/week: 0.0 standard drinks    Comment: has a drink once in a while- 12/30/19   Drug use: No   Sexual activity: Not Currently  Other Topics Concern   Not on file  Social History Narrative   Lives with a roommate in a one  story home.  Has 1 child.  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  Education: high school.   Social Determinants of Health   Financial Resource Strain: Not on file  Food Insecurity: Not on file  Transportation Needs: Not on file  Physical Activity: Not on file  Stress: Not on file  Social Connections: Not on file  Intimate Partner Violence: Not on file   *** Family History  Problem Relation Age of Onset   Cancer Mother     No current facility-administered medications for this visit.   No current outpatient medications on file.   Facility-Administered Medications Ordered in Other Visits  Medication Dose Route Frequency Provider Last Rate Last Admin   acetaminophen (TYLENOL) tablet 650 mg  650 mg Oral Q6H PRN Howerter, Justin B, DO   650 mg at 01/19/22 1203   Or   acetaminophen (TYLENOL) suppository 650 mg  650 mg Rectal Q6H PRN Howerter, Justin B, DO       albuterol (PROVENTIL) (2.5 MG/3ML) 0.083% nebulizer  solution 2.5 mg  2.5 mg Inhalation BID PRN Howerter, Justin B, DO   2.5 mg at 01/21/22 9518   ALPRAZolam Duanne Moron) tablet 0.25 mg  0.25 mg Oral BID PRN Elgergawy, Silver Huguenin, MD   0.25 mg at 01/21/22 0617   apixaban (ELIQUIS) tablet 2.5 mg  2.5 mg Oral BID Howerter, Justin B, DO   2.5 mg at 01/21/22 8416   Chlorhexidine Gluconate Cloth 2 % PADS 6 each  6 each Topical Daily Elgergawy, Silver Huguenin, MD   6 each at 01/21/22 6063   Chlorhexidine Gluconate Cloth 2 % PADS 6 each  6 each Topical Q0600 Roney Jaffe, MD       diltiazem (CARDIZEM CD) 24 hr capsule 300 mg  300 mg Oral q morning Howerter, Justin B, DO   300 mg at 01/21/22 0160   feeding supplement (NEPRO CARB STEADY) liquid 237 mL  237 mL Oral TID WC Elgergawy, Silver Huguenin, MD       hydrocerin (EUCERIN) cream   Topical BID Howerter, Justin B, DO   Given at 01/21/22 1093   metoprolol tartrate (LOPRESSOR) tablet 100 mg  100 mg Oral Daily Howerter, Justin B, DO   100 mg at 01/20/22 1606   naloxone (NARCAN) injection 0.4 mg  0.4 mg Intravenous PRN Howerter, Justin B, DO       naloxone (NARCAN) injection   Intravenous Anesthesia Intra-op Nolon Nations, MD   40 mcg at 07/19/21 1005   nitroGLYCERIN (NITROSTAT) SL tablet 0.4 mg  0.4 mg Sublingual Q5 min PRN Elgergawy, Silver Huguenin, MD   0.4 mg at 01/20/22 2355   oxyCODONE (Oxy IR/ROXICODONE) immediate release tablet 5 mg  5 mg Oral Q6H PRN Elgergawy, Silver Huguenin, MD   5 mg at 01/21/22 1059   sodium zirconium cyclosilicate (LOKELMA) packet 10 g  10 g Oral TID Elgergawy, Silver Huguenin, MD   10 g at 01/20/22 1607    Allergies  Allergen Reactions   Vancomycin Shortness Of Breath and Itching     REVIEW OF SYSTEMS:  *** '[X]'$  denotes positive finding, '[ ]'$  denotes negative finding Cardiac  Comments:  Chest pain or chest pressure:    Shortness of breath upon exertion:    Short of breath when lying flat:    Irregular heart rhythm:        Vascular    Pain in calf, thigh, or hip brought on by ambulation:    Pain in  feet at night  that wakes you up from your sleep:     Blood clot in your veins:    Leg swelling:         Pulmonary    Oxygen at home:    Productive cough:     Wheezing:         Neurologic    Sudden weakness in arms or legs:     Sudden numbness in arms or legs:     Sudden onset of difficulty speaking or slurred speech:    Temporary loss of vision in one eye:     Problems with dizziness:         Gastrointestinal    Blood in stool:     Vomited blood:         Genitourinary    Burning when urinating:     Blood in urine:        Psychiatric    Major depression:         Hematologic    Bleeding problems:    Problems with blood clotting too easily:        Skin    Rashes or ulcers:        Constitutional    Fever or chills:      PHYSICAL EXAMINATION:  There were no vitals filed for this visit.  General:  WDWN in NAD; vital signs documented above Gait: Not observed HENT: WNL, normocephalic Pulmonary: normal non-labored breathing , without wheezing Cardiac: {Desc; regular/irreg:14544} HR, bruit*** Abdomen: soft, NT, no masses Skin: {With/Without:20273} rashes Vascular Exam/Pulses:  Right Left  Radial {Exam; arterial pulse strength 0-4:30167} {Exam; arterial pulse strength 0-4:30167}  Ulnar {Exam; arterial pulse strength 0-4:30167} {Exam; arterial pulse strength 0-4:30167}  Femoral {Exam; arterial pulse strength 0-4:30167} {Exam; arterial pulse strength 0-4:30167}  Popliteal {Exam; arterial pulse strength 0-4:30167} {Exam; arterial pulse strength 0-4:30167}  DP {Exam; arterial pulse strength 0-4:30167} {Exam; arterial pulse strength 0-4:30167}  PT {Exam; arterial pulse strength 0-4:30167} {Exam; arterial pulse strength 0-4:30167}   Extremities: {With/Without:20273} ischemic changes, {With/Without:20273} Gangrene , {With/Without:20273} cellulitis; {With/Without:20273} open wounds;  Musculoskeletal: no muscle wasting or atrophy  Neurologic: A&O X 3;  No focal weakness or  paresthesias are detected Psychiatric:  The pt has {Desc; normal/abnormal:11317::"Normal"} affect.   Non-Invasive Vascular Imaging:   ***    ASSESSMENT/PLAN: Thomas Robinson is a 61 y.o. male presenting with ***   ***   Thomas John, MD [facility] [phone]

## 2022-01-21 NOTE — Care Management Important Message (Signed)
Important Message  Patient Details  Name: Thomas Robinson MRN: [id number] Date of Birth: December 16, 1960   Medicare Important Message Given:  Yes     Shelda Altes 01/21/2022, 9:01 AM

## 2022-01-21 NOTE — Evaluation (Signed)
Occupational Therapy Evaluation Patient Details Name: Thomas Robinson MRN: [id number] DOB: 18-Oct-1960 Today's Date: 01/21/2022   History of Present Illness 61 yo Male admitted 5/19 with SOB and CP post HD with respiratory failure and Rt pleural effusion s/p thoracentesis 5/20. PMhx: ESRD on HD MWF, HTN, GERD, HLD, CVA, Afib, CHF, COPD on 2.5L at home   Clinical Impression   Pt admitted as above presenting with deficits as listed below (refer to OT problem list below). Pt was initially agreeable to OT assessment this morning. Pt participated in limited OT assessment and then refused OOB activity/transfers including bed mobility, repositioning & sitting up at EOB. Pt was educated in Role of OT and stated "I can move fine". Pt presents with generalized weakness and is not currently inclined to participate acute OT this date. Will attempt again next available date to further assess pt ability/willingness to participate. Focus will be on maximizing independence w/ functional mobility and ADL's. Per pt, he plans to return home with PRN assist from friends whom live with him.     Recommendations for follow up therapy are one component of a multi-disciplinary discharge planning process, led by the attending physician.  Recommendations may be updated based on patient status, additional functional criteria and insurance authorization.   Follow Up Recommendations  Skilled nursing-short term rehab (<3 hours/day)    Assistance Recommended at Discharge Frequent or constant Supervision/Assistance  Patient can return home with the following A lot of help with walking and/or transfers;A lot of help with bathing/dressing/bathroom;Assistance with cooking/housework;Direct supervision/assist for medications management;Assist for transportation    Functional Status Assessment  Patient has had a recent decline in their functional status and/or demonstrates limited ability to make significant improvements in function  in a reasonable and predictable amount of time  Equipment Recommendations  Other (comment) (Defer to next venue)    Recommendations for Other Services       Precautions / Restrictions Precautions Precautions: Fall Precaution Comments: HD pt Restrictions Weight Bearing Restrictions: No      Mobility Bed Mobility   General bed mobility comments: Pt declined "I can move fine"    Transfers   General transfer comment: Pt declined: "I want to sleep now" "Come back tomorrow"      Balance Overall balance assessment:  (TBD: anticipate that pt needs assist per chart review)     ADL either performed or assessed with clinical judgement   ADL Overall ADL's : Needs assistance/impaired     Grooming: Minimal assistance;Bed level   Upper Body Bathing: Minimal assistance;Bed level   Lower Body Bathing: Moderate assistance;Bed level   Upper Body Dressing : Moderate assistance;Bed level   Lower Body Dressing: Bed level;Moderate assistance     Toilet Transfer Details (indicate cue type and reason): TBD: Pt declined OOB transfers and/or transfer to chair stating "I can move fine, I'm trying to sleep right now"       Tub/Shower Transfer Details (indicate cue type and reason): NT - Pt declined OOB transfers Functional mobility during ADLs:  (TBD: Pt declined OOB transfers and or sitting of standing at EOB) General ADL Comments: Pt was initially agreeable to OT assessment this morning. Pt participated in limited OT assessment and then refused OOB activity/transfers including bed mobility & sitting up at EOB. Pt was educated in Role of OT and stated "I can move fine". Pt presents with generalized weakness and is not currently inclined to participate acute OT this date. Will attempt again next available date to further assess  pt ability/willingness to participate. Focus will be on maximizing independence w/ functional mobility and ADL's. Per pt, he plans to return home with PRN assist from  friends whom live with him.     Vision Baseline Vision/History: 1 Wears glasses Patient Visual Report: No change from baseline Additional Comments: Wears reading glasses            Pertinent Vitals/Pain Pain Assessment Pain Assessment: No/denies pain     Hand Dominance Right   Extremity/Trunk Assessment Upper Extremity Assessment Upper Extremity Assessment: Generalized weakness   Lower Extremity Assessment Lower Extremity Assessment: Defer to PT evaluation       Communication Communication Communication: No difficulties   Cognition Arousal/Alertness: Lethargic, Awake/alert Behavior During Therapy: Flat affect Overall Cognitive Status: Difficult to assess     General Comments: A & O to person, place, day and year but not month, did not state situation/reason for admission despite several attempts. Keeps eyes closed throughout     General Comments  Pt is frail, initially willing to participate in OT assessment and functional mobility. Pt changed his mind prior to mobility and repositioning, declined and stated "Come back tomorrow". Pt was encouraged verbally to participate for d/c planning but refused.            Home Living Family/patient expects to be discharged to:: Private residence Living Arrangements: Other (Comment) Available Help at Discharge: Friend(s);Available 24 hours/day Type of Home: House Home Access: Stairs to enter [employer] of Steps: 2 Entrance Stairs-Rails: None Home Layout: One level     Bathroom Shower/Tub: Teacher, early years/pre: Standard Bathroom Accessibility: Yes How Accessible: Accessible via walker Home Equipment: Rollator (4 wheels);Cane - single point;Shower seat;Wheelchair - manual   Additional Comments: pt reports good friends live with him and provides as much assist as needed      Prior Functioning/Environment Prior Level of Function : Needs assist  Cognitive Assist : ADLs (cognitive)   ADLs  (Cognitive): Intermittent cues Physical Assist : Mobility (physical);ADLs (physical)     Mobility Comments: limited ambulator with rollator, states his friend currently does not let him drive ADLs Comments: reports using shower chair and having assist from friend for all ADLs        OT Problem List: Decreased knowledge of use of DME or AE;Decreased activity tolerance;Decreased cognition;Impaired balance (sitting and/or standing);Decreased safety awareness      OT Treatment/Interventions: Self-care/ADL training;Patient/family education;Therapeutic activities;Energy conservation;DME and/or AE instruction    OT Goals(Current goals can be found in the care plan section) Acute Rehab OT Goals Patient Stated Goal: "I want to sleep" OT Goal Formulation: Patient unable to participate in goal setting Time For Goal Achievement: 02/04/22 Potential to Achieve Goals: Poor  OT Frequency: Min 2X/week       AM-PAC OT "6 Clicks" Daily Activity     Outcome Measure Help from another person eating meals?: None Help from another person taking care of personal grooming?: A Little Help from another person toileting, which includes using toliet, bedpan, or urinal?: A Lot Help from another person bathing (including washing, rinsing, drying)?: A Lot Help from another person to put on and taking off regular upper body clothing?: A Little Help from another person to put on and taking off regular lower body clothing?: A Lot 6 Click Score: 16   End of Session Nurse Communication: Other (comment) (Pt declining OOB and refusing further OT assessment today)  Activity Tolerance: Patient limited by lethargy;Other (comment) (Limited by pt refusal to participate in  functional mobility and transfers) Patient left: in bed;with call bell/phone within reach  OT Visit Diagnosis: Muscle weakness (generalized) (M62.81);Unsteadiness on feet (R26.81)                Time: 0413-6438 OT Time Calculation (min): 12  min Charges:  OT General Charges $OT Visit: 1 Visit OT Evaluation $OT Eval Moderate Complexity: 1 Mod  Carel Carrier Beth Dixon, OTR/L 01/21/2022, 9:48 AM

## 2022-01-21 NOTE — Progress Notes (Signed)
Pt receives out-pt HD at [facility] on MWF. Pt arrives at 1:00 for 1:20 chair time. Will assist as needed.   Melven Sartorius Renal Navigator [phone]

## 2022-01-22 ENCOUNTER — Inpatient Hospital Stay (HOSPITAL_COMMUNITY): Payer: Medicare Other

## 2022-01-22 DIAGNOSIS — J9621 Acute and chronic respiratory failure with hypoxia: Secondary | ICD-10-CM | POA: Diagnosis not present

## 2022-01-22 LAB — CBC
HCT: 29.9 % — ABNORMAL LOW (ref 39.0–52.0)
Hemoglobin: 9.2 g/dL — ABNORMAL LOW (ref 13.0–17.0)
MCH: 30.5 pg (ref 26.0–34.0)
MCHC: 30.8 g/dL (ref 30.0–36.0)
MCV: 99 fL (ref 80.0–100.0)
Platelets: 142 10*3/uL — ABNORMAL LOW (ref 150–400)
RBC: 3.02 MIL/uL — ABNORMAL LOW (ref 4.22–5.81)
RDW: 16 % — ABNORMAL HIGH (ref 11.5–15.5)
WBC: 6.8 10*3/uL (ref 4.0–10.5)
nRBC: 0 % (ref 0.0–0.2)

## 2022-01-22 LAB — BASIC METABOLIC PANEL
Anion gap: 13 (ref 5–15)
BUN: 80 mg/dL — ABNORMAL HIGH (ref 8–23)
CO2: 24 mmol/L (ref 22–32)
Calcium: 9.1 mg/dL (ref 8.9–10.3)
Chloride: 96 mmol/L — ABNORMAL LOW (ref 98–111)
Creatinine, Ser: 7.64 mg/dL — ABNORMAL HIGH (ref 0.61–1.24)
GFR, Estimated: 7 mL/min — ABNORMAL LOW (ref >60)
Glucose, Bld: 128 mg/dL — ABNORMAL HIGH (ref 70–99)
Potassium: 5.5 mmol/L — ABNORMAL HIGH (ref 3.5–5.1)
Sodium: 133 mmol/L — ABNORMAL LOW (ref 135–145)

## 2022-01-22 LAB — CULTURE, BLOOD (ROUTINE X 2)
Culture: NO GROWTH
Culture: NO GROWTH
Special Requests: ADEQUATE

## 2022-01-22 MED ORDER — ALPRAZOLAM 0.25 MG PO TABS: 0.2500 mg | ORAL_TABLET | Freq: Three times a day (TID) | ORAL | Status: DC | PRN

## 2022-01-22 MED ORDER — ALPRAZOLAM 0.5 MG PO TABS
ORAL_TABLET | ORAL | Status: AC
  Filled 2022-01-22: qty 1

## 2022-01-22 MED ORDER — HEPARIN SODIUM (PORCINE) 1000 UNIT/ML IJ SOLN
INTRAMUSCULAR | Status: AC
  Filled 2022-01-22: qty 4

## 2022-01-23 ENCOUNTER — Ambulatory Visit: Payer: Medicare Other | Admitting: Vascular Surgery

## 2022-01-23 ENCOUNTER — Encounter (HOSPITAL_COMMUNITY): Payer: Medicare Other

## 2022-01-30 NOTE — Progress Notes (Signed)
PROGRESS NOTE    Thomas Robinson  RKY:[id number] DOB: April 07, 1961 DOA: 01/25/2022 PCP: [name], PA   Brief Narrative:  This 61 y.o. male with medical history significant for  ESRD on hemodialysis on Monday, Wednesday, Friday, chronic hypoxic respiratory failure on 2 to 4 L continuous supplemental oxygen via nasal cannula, chronic diastolic heart failure, paroxysmal atrial flutter chronically anticoagulated on Eliquis, who is admitted to [facility] on 01/09/2022 with acute on chronic hypoxic respiratory failure in the setting of recurrent right pleural effusion after presenting from home to [facility] ED complaining of chest pain.  -Work-up significant for recurrent large right pleural effusion, worsening hypoxemia and oxygen requirement, he required thoracentesis 1.6 L with significant improvement of his symptoms, patient with extreme noncompliance with medical recommendations, he declines dialysis multiple times, as well with PT/OT, and multiple lab works, as of 5/23 he did not receive dialysis yet due to multiple refusals, as well this morning he is again requiring increased oxygen requirement and appears to be in volume overload.  Assessment & Plan:   Principal Problem:   Acute on chronic respiratory failure with hypoxia (HCC) Active Problems:   Pleural effusion, right   Atypical chest pain   ESRD (end stage renal disease) (HCC)   Paroxysmal atrial flutter (HCC)  Acute on chronic hypoxic respiratory failure: Likely secondary to recurrent right pleural effusion and pulmonary edema. Patient uses supplemental oxygen 4 L at baseline, initially requiring up to 10 L nasal cannula, this has improved after thoracentesis . He has worsening hypoxia , O2 requirement up to 14 L nasal cannula this morning, imaging significant for recurrent pleural effusion and pulmonary edema . Patient will have hemodialysis today and likely will need repeat thoracentesis . -Use incentive spirometer.    Recurrent right pleural effusion: Transudative, most likely in the setting of renal failure. Status post thoracentesis with 1.6 L drained on 5/20 Recent work-up in the past significant for transudative effusion, and cytology negative for malignancy. Appears to be with recurrent pleural effusion, due to noncompliance with HD, will need repeat thoracentesis. It was discussed with the patient about Pleurx cath given his frequent admission and recurrent of pleural effusion, but he declined.   Atypical chest pain: No ischemic changes and EKG, He was complaining of chest pain , his EKG is nonacute, his troponin is reassuring as it is lower than his baseline in the known setting of ESRD   ESRD: Continue HD Monday Wednesday Friday schedule. -Has refused HD multiple times this admission.   Paroxysmal atrial flutter: -Continue with Eliquis -Heart rate on the lower side, blood pressure is soft as well,  metoprolol decreased by 50%, and Cardizem from 360>>240 daily.   Severe protein calorie malnutrition Continue Nepro.   Hyperkalemia -Managed with Lokelma   Goals of care/noncompliance Patient with severe noncompliance, he has been refusing a lot of hospital care, he did refuse HD yesterday, currently is agreeing to it, he has refused labs in the past, he did refuse with PT/OT, has been refusing medications, we have discussed with the patient at length, noncompliance, and refusal of treatment certainly putting his life at jeopardy.       DVT prophylaxis:  Heparin Code Status: DNR Family Communication: No family at bedside Disposition Plan:    Status is: Inpatient Remains inpatient appropriate because: Admitted for acute on chronic hypoxic respiratory failure recurrent pleural effusion, end-stage renal disease on hemodialysis refusing hemodialysis.    Consultants:  Nephrology.  Procedures: Hemodialysis Antimicrobials: Anti-infectives (From admission, onward)  None         Subjective: Patient was seen and examined in hemodialysis suite.  Patient reports feeling weak, appears lethargic but following partial commands.  Patient has been refusing dialysis, now he is getting dialysis today.  Objective: Vitals:   09-Feb-2022 1145 February 09, 2022 1237 02-09-2022 1353 02/09/2022 1421  BP: 104/75 121/83    Pulse:  82    Resp: (!) 27 20  (!) 0  Temp: 97.7 F (36.5 C) (!) 97.3 F (36.3 C)    TempSrc:  Oral    SpO2: 99% 90% 90%   Weight: 50.6 kg   50.9 kg  Height:    '5\' 8"'$  (1.727 m)    Intake/Output Summary (Last 24 hours) at 2022-02-09 1525 Last data filed at 02/09/22 1139 Gross per 24 hour  Intake 291.67 ml  Output 727 ml  Net -435.33 ml   Filed Weights   2022-02-09 0915 02-09-2022 1145 2022-02-09 1421  Weight: 50.9 kg 50.6 kg 50.9 kg    Examination:  General exam: Appears comfortable, extremely frail, cachectic, deconditioned. Respiratory system: Diminished air entry bilaterally, scattered crackles. Cardiovascular system: S1 & S2 heard, regular rate and rhythm, no murmur. Gastrointestinal system: Abdomen is soft, nondistended, nontender, BS+ Central nervous system: Alert and oriented X 2. No focal neurological deficits. Extremities: No edema, significant muscle wasting. Skin: No rashes, lesions or ulcers Psychiatry: Not assessed.    Data Reviewed: I have personally reviewed following labs and imaging studies  CBC: Recent Labs  Lab 01/25/2022 2047 01/14/2022 2104 01/18/22 0541 01/20/22 0944 February 09, 2022 0940  WBC 5.3  --  4.3 6.1 6.8  NEUTROABS 3.9  --  3.0  --   --   HGB 11.1* 10.5* 10.6* 9.9* 9.2*  HCT 35.3* 31.0* 34.9* 30.9* 29.9*  MCV 97.5  --  98.3 97.2 99.0  PLT 153  --  132* 140* 400*   Basic Metabolic Panel: Recent Labs  Lab 01/19/2022 2047 01/03/2022 2104 01/16/2022 2310 01/18/22 0541 01/19/22 0309 01/19/22 1441 01/20/22 0944 February 09, 2022 0940  NA 139 137  --  137 135  --  134* 133*  K 4.9 4.8  --  5.5* 6.4* 5.8* 5.6* 5.5*  CL 103  --   --  101 99   --  98 96*  CO2 24  --   --  24 24  --  21* 24  GLUCOSE 115*  --   --  107* 108*  --  125* 128*  BUN 39*  --   --  44* 54*  --  77* 80*  CREATININE 4.78*  --   --  5.08* 5.94*  --  7.25* 7.64*  CALCIUM 9.0  --   --  8.9 8.5*  --  8.7* 9.1  MG  --   --  2.4 2.4  --   --   --   --   PHOS  --   --   --  6.6*  --   --  8.2*  --    GFR: Estimated Creatinine Clearance: 7.2 mL/min (A) (by C-G formula based on SCr of 7.64 mg/dL (H)). Liver Function Tests: Recent Labs  Lab 01/25/2022 2047 01/18/22 0541 01/20/22 0944  AST 19 20  --   ALT 13 14  --   ALKPHOS 98 95  --   BILITOT 0.5 0.5  --   PROT 6.3* 5.9*  --   ALBUMIN 2.7* 2.5* 2.2*   No results for input(s): LIPASE, AMYLASE in the last 168 hours. No results  for input(s): AMMONIA in the last 168 hours. Coagulation Profile: No results for input(s): INR, PROTIME in the last 168 hours. Cardiac Enzymes: No results for input(s): CKTOTAL, CKMB, CKMBINDEX, TROPONINI in the last 168 hours. BNP (last 3 results) No results for input(s): PROBNP in the last 8760 hours. HbA1C: No results for input(s): HGBA1C in the last 72 hours. CBG: Recent Labs  Lab 01/19/22 1829  GLUCAP 68*   Lipid Profile: No results for input(s): CHOL, HDL, LDLCALC, TRIG, CHOLHDL, LDLDIRECT in the last 72 hours. Thyroid Function Tests: No results for input(s): TSH, T4TOTAL, FREET4, T3FREE, THYROIDAB in the last 72 hours. Anemia Panel: No results for input(s): VITAMINB12, FOLATE, FERRITIN, TIBC, IRON, RETICCTPCT in the last 72 hours. Sepsis Labs: Recent Labs  Lab 01/15/2022 2047 01/12/2022 2310  LATICACIDVEN 1.1 1.0    Recent Results (from the past 240 hour(s))  Culture, blood (routine x 2)     Status: None   Collection Time: 01/01/2022  8:47 PM   Specimen: BLOOD  Result Value Ref Range Status   Specimen Description BLOOD BLOOD LEFT HAND  Final   Special Requests   Final    BOTTLES DRAWN AEROBIC AND ANAEROBIC Blood Culture results may not be optimal due to an  inadequate volume of blood received in culture bottles   Culture   Final    NO GROWTH 5 DAYS Performed at [facility], [place] [address] [place], [place] [address]    Report Status Jan 24, 2022 FINAL  Final  Culture, blood (routine x 2)     Status: None   Collection Time: 01/04/2022 11:28 PM   Specimen: BLOOD  Result Value Ref Range Status   Specimen Description BLOOD SITE NOT SPECIFIED  Final   Special Requests   Final    BOTTLES DRAWN AEROBIC AND ANAEROBIC Blood Culture adequate volume   Culture   Final    NO GROWTH 5 DAYS Performed at [facility], [place] [address] [place], [place] [address]    Report Status 01/24/2022 FINAL  Final         Radiology Studies: DG Chest Port 1 View  Result Date: 01/21/2022 CLINICAL DATA:  Pleural effusion.  Shortness of breath. EXAM: PORTABLE CHEST 1 VIEW COMPARISON:  01/18/2022 FINDINGS: Central line remains in place with its tip at the SVC RA junction. Enlarged cardiac silhouette as seen previously. Diffuse interstitial pulmonary edema as seen previously. Enlarging effusions on both sides with associated atelectasis, worse on the right than the left. IMPRESSION: Radiographic worsening. Persistent interstitial edema. Enlarging effusions on both sides with dependent atelectasis, right worse than left. Electronically Signed   By: Nelson Chimes M.D.   On: 01/21/2022 10:51    Scheduled Meds:  apixaban  2.5 mg Oral BID   Chlorhexidine Gluconate Cloth  6 each Topical Daily   Chlorhexidine Gluconate Cloth  6 each Topical Q0600   diltiazem  240 mg Oral q morning   feeding supplement (NEPRO CARB STEADY)  237 mL Oral TID WC   heparin sodium (porcine)       hydrocerin   Topical BID   metoprolol tartrate  50 mg Oral Daily   Continuous Infusions:   LOS: 5 days    Time spent: 50 mins    Azel Gumina, MD [facility]   If 7PM-7AM, please contact night-coverage

## 2022-01-30 NOTE — Progress Notes (Signed)
Nephrology Follow-Up Consult note   Assessment/Recommendations: Thomas Robinson is a/an 61 y.o. male with a past medical history significant for ESRD, admitted for acute on chronic hypoxic respiratory failure.       OP HD: SW MWF  3.5h  400/800   44kg  2/2 bath  TDC  Hep none - last HD 5/19 48.3 > 48.3kg - last Hb 11.6 on 4/24 - mircera 225 ug q2, last 5/10, due 5/24 - 01/13/22 > HBsAg neg w/ Abs < 10      Assessment/Plan: A/C hypoxic resp faliure - sp R thoracentesis 5/20 w/ improved resp status but still significant hypoxia. Continue w/ UF as tolerated Volume -no obvious signs of volume overload on exam but likely is volume up.  Continue with ultrafiltration as tolerated which is limited by blood pressure ESRD: Intermittently refusing dialysis.  Continue MWF schedule.  Very poor prognosis. R sided pleural effusion: S/p thoracentesis 5/20 by IR.  Recent chest pain: seen by cardiology.  Not thought to be ischemic, more likely due to vol ^, pleural effusions and severe pHTN. Had [place] at [facility] in 2021 which showed normal coronary arteries. Last ECHO per cardiology note 01/13/22 showed progressive valve disease w/ moderate AI and severe MAC w/ mod-severe MV stenosis. NOT a candidate for invasive procedures at this point. Only option was medical Rx/ BB therapy to improve diastolic filling. Also hospice care was recommended.  Anemia esrd: Hgb 9.2, follow, ESA due today Metabolic bone disease: Continue home Sensipar.  Phos a bit high cont renvela as binder. Nutrition:  Albumin 2.4, continue Nepro supps, Pro-source DNR   Recommendations conveyed to primary service.    [facility] 01-29-22 10:23 AM  ___________________________________________________________  CC: Volume overload  Interval History/Subjective: Patient fairly frustrated this morning about his food order being wrong.  States otherwise he is fine and he wants to leave the  hospital.   Medications:  Current Facility-Administered Medications  Medication Dose Route Frequency Provider Last Rate Last Admin   acetaminophen (TYLENOL) tablet 650 mg  650 mg Oral Q6H PRN Howerter, Justin B, DO   650 mg at 01/21/22 1639   Or   acetaminophen (TYLENOL) suppository 650 mg  650 mg Rectal Q6H PRN Howerter, Justin B, DO       albuterol (PROVENTIL) (2.5 MG/3ML) 0.083% nebulizer solution 2.5 mg  2.5 mg Inhalation Q6H PRN Elgergawy, Silver Huguenin, MD   2.5 mg at 01/21/22 2207   ALPRAZolam (XANAX) tablet 0.25 mg  0.25 mg Oral BID PRN Elgergawy, Silver Huguenin, MD   0.25 mg at 2022-01-29 0404   apixaban (ELIQUIS) tablet 2.5 mg  2.5 mg Oral BID Howerter, Justin B, DO   2.5 mg at 01/21/22 2141   Chlorhexidine Gluconate Cloth 2 % PADS 6 each  6 each Topical Daily Elgergawy, Silver Huguenin, MD   6 each at 01/21/22 2111   Chlorhexidine Gluconate Cloth 2 % PADS 6 each  6 each Topical Q0600 Roney Jaffe, MD       diltiazem (CARDIZEM CD) 24 hr capsule 240 mg  240 mg Oral q morning Elgergawy, Silver Huguenin, MD       feeding supplement (NEPRO CARB STEADY) liquid 237 mL  237 mL Oral TID WC Elgergawy, Silver Huguenin, MD       hydrocerin (EUCERIN) cream   Topical BID Howerter, Justin B, DO   Given at 01/21/22 0903   metoprolol tartrate (LOPRESSOR) tablet 50 mg  50 mg Oral Daily Elgergawy, Silver Huguenin,  MD       naloxone [facility]) injection 0.4 mg  0.4 mg Intravenous PRN Howerter, Justin B, DO       nitroGLYCERIN (NITROSTAT) SL tablet 0.4 mg  0.4 mg Sublingual Q5 min PRN Elgergawy, Silver Huguenin, MD   0.4 mg at 01/20/22 0849   oxyCODONE (Oxy IR/ROXICODONE) immediate release tablet 5 mg  5 mg Oral Q6H PRN Elgergawy, Silver Huguenin, MD   5 mg at 2022-02-19 0359   Facility-Administered Medications Ordered in Other Encounters  Medication Dose Route Frequency Provider Last Rate Last Admin   naloxone (NARCAN) injection   Intravenous Anesthesia Intra-op Nolon Nations, MD   40 mcg at 07/19/21 1005      Review of Systems: 10 systems reviewed  and negative except per interval history/subjective  Physical Exam: Vitals:   February 19, 2022 0931 Feb 19, 2022 1000  BP: (!) 90/43 (!) 96/54  Pulse:    Resp: 20 20  Temp: 98.3 F (36.8 C)   SpO2: 96%    No intake/output data recorded.  Intake/Output Summary (Last 24 hours) at 02-19-2022 1023 Last data filed at 01/21/2022 1700 Gross per 24 hour  Intake 51.67 ml  Output 336 ml  Net -284.33 ml   Constitutional: Ill-appearing, no distress ENMT: ears and nose without scars or lesions, MMM CV: normal rate, no edema Respiratory: Increased work of breathing, bilateral chest rise Gastrointestinal: soft, non-tender, no palpable masses or hernias Skin: no visible lesions or rashes Psych: alert, judgement/insight appropriate, angry mood, affect appropriate   Test Results I personally reviewed new and old clinical labs and radiology tests Lab Results  Component Value Date   NA 133 (L) Feb 19, 2022   K 5.5 (H) 02-19-22   CL 96 (L) Feb 19, 2022   CO2 24 2022/02/19   BUN 80 (H) February 19, 2022   CREATININE 7.64 (H) Feb 19, 2022   GFR 9.29 (LL) 04/07/2016   CALCIUM 9.1 2022/02/19   ALBUMIN 2.2 (L) 01/20/2022   PHOS 8.2 (H) 01/20/2022    CBC Recent Labs  Lab 01/02/2022 2047 01/16/2022 2104 01/18/22 0541 01/20/22 0944 2022-02-19 0940  WBC 5.3  --  4.3 6.1 6.8  NEUTROABS 3.9  --  3.0  --   --   HGB 11.1*   < > 10.6* 9.9* 9.2*  HCT 35.3*   < > 34.9* 30.9* 29.9*  MCV 97.5  --  98.3 97.2 99.0  PLT 153  --  132* 140* 142*   < > = values in this interval not displayed.

## 2022-01-30 NOTE — Progress Notes (Signed)
OT Cancellation Note  Patient Details Name: Makoto Sellitto MRN: [id number] DOB: 1961/06/01   Cancelled Treatment:    Reason Eval/Treat Not Completed: Patient at procedure or test/ unavailable (Pt off floor for HD. Will assess at later date as schedule allows)  Almyra Deforest, OTR/L 05-Feb-2022, 10:44 AM

## 2022-01-30 NOTE — Death Summary Note (Signed)
DEATH SUMMARY   Patient Details  Name: Thomas Robinson MRN: [id number] DOB: November 20, 1960 JQB:HALPFX, [place], [place] Admission/Discharge Information   Admit Date:  Jan 18, 2022  Date of Death: Date of Death: 01-23-2022  Time of Death: Time of Death: 28  Length of Stay: 5   Principle Cause of death: Acute on chronic hypoxic respiratory failure. Uremic encephalopathy, severe protein calorie malnutrition.  Hospital Diagnoses: Principal Problem:   Acute on chronic respiratory failure with hypoxia (HCC) Active Problems:   Pleural effusion, right   Atypical chest pain   ESRD (end stage renal disease) (HCC)   Paroxysmal atrial flutter [facility])   Hospital Course: This 61 y.o. male with medical history significant for  ESRD on hemodialysis on Monday, Wednesday, Friday, chronic hypoxic respiratory failure on 2 to 4 L continuous supplemental oxygen via nasal cannula, chronic diastolic heart failure, paroxysmal atrial flutter chronically anticoagulated on Eliquis, who is admitted to [facility] on 01/18/22 with acute on chronic hypoxic respiratory failure in the setting of recurrent right pleural effusion after presenting from home to [facility] ED complaining of chest pain.  -Work-up significant for recurrent large right pleural effusion, worsening hypoxemia and oxygen requirement, he required thoracentesis 1.6 L with significant improvement of his symptoms, patient with extreme noncompliance with medical recommendations, he declines dialysis multiple times, as well with PT/OT, and multiple lab works, as of 5/23 he did not receive dialysis yet due to multiple refusals, as well this morning he is again requiring increased oxygen requirement and appears to be in volume overload. Patient had a hemodialysis but continued to have worsening respiratory distress.  Since patient was DNR DNI.  Patient passed away and was pronounced at 14:21 PM.   Assessment and Plan: * Acute on chronic respiratory failure with  hypoxia ([place])   #) Acute on chronic hypoxic respiratory failure in the setting of recurrent right pleural effusion: Relative to baseline supplemental oxygen requirements of 4 L continuous nasal cannula, patient's initial O2 sats noted to be in the mid 80s, subsequent improving into the mid 90s following interval initiation of BiPAP, with settings as quantified above, with chest x-ray revealing resolution of right-sided pleural effusion.  This is relative to recent hospitalization in which he was admitted with right-sided pleural effusion, undergoing thoracentesis at that time, with associated painful 1.5 L.  Appears that he would benefit from additional therapeutic thoracentesis.  Will consult interventional radiology for this purpose.  Of note, ACS felt to be less likely given the atypical nature of his chest discomfort, with high sensitive troponin I consistent with his chronic range.  Acute pulmonary embolism less likely in the setting of chronic anticoagulation on Eliquis.  Plan: Monitor continuous pulse oximetry.  Continue BiPAP for now.  Check blood gas.  Consult placed with interventional radiology for therapeutic thoracentesis, as above.  Add on serum magnesium level and serum phosphorus level.  Pete CMP and CBC in the morning.     Pleural effusion, right (Please see acute on chronic hypoxic respiratory failure)  Paroxysmal atrial flutter (HCC)    #) Paroxysmal atrial flutter: Documented history of such, on chronic anticoagulation via Eliquis.  AV nodal blocking regimen consists of Lopressor and diltiazem.  Presenting chest x-ray demonstrates sinus rhythm without overt evidence of acute ischemic changes.  Plan: Continue chronic anticoagulation on Eliquis as well as home AV nodal blocking regimen.  Add on serum magnesium level.  Monitor on telemetry.    ESRD (end stage renal disease) ([place])   #) ESRD: on  HD (schedule: Monday, Wednesday, Friday). Next due for routine HD on Monday,  01/20/2022. No clinical evidence for urgent overnight HD or to expedite HD relative to this timeframe.  Attended full hemodialysis session on Friday, 01/24/2022.  Plan: monitor strict I's/O's, daily weights. CMP in the AM. Check mag and phos levels.      Atypical chest pain   #) Atypical chest pain: 1 day sharp substernal chest discomfort that appears pleuritic in nature nonexertional, and without improvement with nitroglycerin.  Appears consistent with atypical chest discomfort in the setting of reaccumulation of his right-sided pleural effusion.  ACS appears less likely, without any evidence of acute ischemic changes on EKG, We will high-sensitivity troponin I on par with his baseline chronic elevation.  Plan: Prn IV Dilaudid.  We will continue to trend troponin.  Monitor on telemetry.  IR consultation for therapeutic thoracentesis, as above.     The results of significant diagnostics from this hospitalization (including imaging, microbiology, ancillary and laboratory) are listed below for reference.   Significant Diagnostic Studies: DG Chest 1 View  Result Date: 01/18/2022 CLINICAL DATA:  Status post right thoracentesis EXAM: CHEST  1 VIEW COMPARISON:  01/16/2022 FINDINGS: There is interval decrease in amount of right pleural effusion. There is no pneumothorax. Small bilateral pleural effusions are seen, more so on the right side. Transverse diameter of heart is increased. There is decrease in pulmonary vascular congestion with residual prominence of central pulmonary vessels. There is improvement in aeration of parahilar regions suggesting decrease in pulmonary edema. No new focal infiltrates are seen. There is surgical fusion in the lower cervical spine. Tip of right IJ dialysis catheter is seen in the superior vena cava. IMPRESSION: Cardiomegaly. There is decrease in pulmonary vascular congestion. There is decrease in right pleural effusion. There is no pneumothorax. Electronically Signed    By: Elmer Picker M.D.   On: 01/18/2022 09:58   DG Chest Port 1 View  Result Date: 01/21/2022 CLINICAL DATA:  Pleural effusion.  Shortness of breath. EXAM: PORTABLE CHEST 1 VIEW COMPARISON:  01/18/2022 FINDINGS: Central line remains in place with its tip at the SVC RA junction. Enlarged cardiac silhouette as seen previously. Diffuse interstitial pulmonary edema as seen previously. Enlarging effusions on both sides with associated atelectasis, worse on the right than the left. IMPRESSION: Radiographic worsening. Persistent interstitial edema. Enlarging effusions on both sides with dependent atelectasis, right worse than left. Electronically Signed   By: Nelson Chimes M.D.   On: 01/21/2022 10:51   DG Chest Port 1 View  Result Date: 01/24/2022 CLINICAL DATA:  Dyspnea EXAM: PORTABLE CHEST 1 VIEW COMPARISON:  01/13/2022 FINDINGS: Moderate to large right pleural effusion has increased in size since prior examination with compressive atelectasis of the right lung base. Superimposed diffuse ground-glass pulmonary infiltrate appears progressive within the perihilar regions, suspicious for alveolar pulmonary edema. No pneumothorax. Right internal jugular hemodialysis catheter tip again noted within the superior vena cava. Mild cardiomegaly is stable. No acute bone abnormality. IMPRESSION: Progressive diffuse pulmonary infiltrate and enlarging, moderate to large right pleural effusion in keeping with probable changes of progressive cardiogenic failure. Electronically Signed   By: Fidela Salisbury M.D.   On: 01/25/2022 20:55   DG Chest Port 1 View  Result Date: 01/13/2022 CLINICAL DATA:  Chest pain, shortness of breath. EXAM: PORTABLE CHEST 1 VIEW COMPARISON:  Jan 12, 2022. FINDINGS: Stable cardiomegaly. Right internal jugular dialysis catheter is unchanged. Stable probable bilateral pulmonary edema is noted with bilateral pleural effusions, right greater than  left. Bony thorax is unremarkable. IMPRESSION: Stable  bilateral pulmonary edema is noted with bilateral pleural effusions, right greater than left. Electronically Signed   By: Marijo Conception M.D.   On: 01/13/2022 15:39   DG Chest Port 1 View  Result Date: 01/12/2022 CLINICAL DATA:  Acute on chronic respiratory failure. Shortness of breath. EXAM: PORTABLE CHEST 1 VIEW COMPARISON:  01/11/22 FINDINGS: Right chest wall dialysis catheter noted with tips at the cavoatrial junction. Stable cardiomediastinal contours. Aortic atherosclerosis. Unchanged moderate right pleural effusion. Diffuse pulmonary edema pattern is also unchanged in the interval. Atelectasis is noted within both lung bases. IMPRESSION: 1. No change in right pleural effusion and pulmonary edema. 2. Bibasilar atelectasis. Electronically Signed   By: Kerby Moors M.D.   On: 01/12/2022 09:28   DG Chest Port 1 View  Result Date: 01/11/2022 CLINICAL DATA:  Shortness of breath and COPD. EXAM: PORTABLE CHEST 1 VIEW COMPARISON:  01/10/2022 FINDINGS: Right chest wall dialysis catheter is noted with tip projecting over the cavoatrial junction. Stable cardiomediastinal contours. Moderate right pleural effusion. Mild diffuse interstitial scratch set diffuse interstitial edema pattern is unchanged. Bilateral airspace opacities are identified with worsening aeration to the right mid and right lower lung. IMPRESSION: 1. Right pleural effusion and pulmonary edema, unchanged. 2. Bilateral pulmonary opacities with worsening aeration to the right mid and right lower lung. Electronically Signed   By: Kerby Moors M.D.   On: 01/11/2022 07:22   DG Chest Port 1 View  Result Date: 01/10/2022 CLINICAL DATA:  Shortness of breath EXAM: PORTABLE CHEST 1 VIEW COMPARISON:  01/09/2022 FINDINGS: Tunneled right IJ dialysis catheter is again identified. Persistent but decreased right pleural effusion and adjacent atelectasis. Persistent bilateral patchy opacities and left basilar atelectasis. Stable cardiomegaly. IMPRESSION:  Persistent but decreased right pleural effusion and adjacent atelectasis. Persistent bilateral opacities favored to reflect edema. Electronically Signed   By: Macy Mis M.D.   On: 01/10/2022 14:10   DG Chest Port 1 View  Result Date: 01/09/2022 CLINICAL DATA:  sob, chest pain starting yesterday after dialysis, dizziness EXAM: PORTABLE CHEST 1 VIEW COMPARISON:  December 15, 2021 FINDINGS: Again seen is the right transjugular tunneled dialysis catheter. Cardiomegaly. Moderately large right pleural effusion and has worsened in the interim. Pulmonary vascular congestion appears overall stable. Bibasilar atelectasis. The visualized skeletal structures are unremarkable. IMPRESSION: Moderately large right pleural effusion and has increased in the interim. Pulmonary vascular congestion appears stable. Cardiomegaly. Electronically Signed   By: Frazier Richards M.D.   On: 01/09/2022 13:17   ECHOCARDIOGRAM COMPLETE  Result Date: 01/11/2022    ECHOCARDIOGRAM REPORT   Patient Name:   LEMUEL BOODRAM Date of Exam: 01/11/2022 Medical Rec #:  [id number]        Height:       68.0 in Accession #:    [id number]       Weight:       106.5 lb Date of Birth:  [birth year]        BSA:          1.563 m Patient Age:    70 years         BP:           145/79 mmHg Patient Gender: M                HR:           84 bpm. Exam Location:  Inpatient Procedure: 2D Echo, Cardiac Doppler, Color Doppler and Strain Analysis Indications:  CHF  History:        Patient has prior history of Echocardiogram examinations, most                 recent 08/12/2021. CHF, Signs/Symptoms:Shortness of Breath and                 Edema; Risk Factors:Hypertension. ESRD on dialysis.  Sonographer:    Merrie Roof RDCS Referring Phys: [id number] [place]  1. Hyperdynamic LV, intracavitary gradient up to 25 mmHG. Severe concentric LVH. Apical sparing noted on strain imaging. Would consider PYP scan to exclude cardiac amyloidosis. Left ventricular ejection  fraction, by estimation, is 65 to 70%. The left ventricle has normal function. Left ventricular endocardial border not optimally defined to evaluate regional wall motion. There is severe concentric left ventricular hypertrophy. Left ventricular diastolic function could not be evaluated.  2. Concerns for severe calcific mitral stenosis. MG 10 mmHG @ 80 bpm. MVA by VTI inaccurate due to moderate AI influencing SV assessment. Would consider TEE for clarification. The mitral valve is degenerative. Trivial mitral valve regurgitation. Severe mitral stenosis. The mean mitral valve gradient is 10.0 mmHg with average heart rate of 80 bpm. Moderate to severe mitral annular calcification.  3. Calcific aortic valve. Mild to moderate aortic stenosis. Moderate aortic regurgitation. The aortic valve is tricuspid. There is moderate calcification of the aortic valve. There is moderate thickening of the aortic valve. Aortic valve regurgitation is moderate. Mild to moderate aortic valve stenosis. Aortic valve area, by VTI measures 1.46 cm. Aortic valve mean gradient measures 19.0 mmHg. Aortic valve Vmax measures 2.91 m/s.  4. Right ventricular systolic function is mildly reduced. The right ventricular size is normal. There is moderately elevated pulmonary artery systolic pressure. The estimated right ventricular systolic pressure is 73.7 mmHg.  5. Left atrial size was severely dilated.  6. Right atrial size was moderately dilated.  7. A small pericardial effusion is present. The pericardial effusion is circumferential. There is no evidence of cardiac tamponade.  8. There is moderate dilatation of the ascending aorta, measuring 43 mm.  9. The inferior vena cava is normal in size with greater than 50% respiratory variability, suggesting right atrial pressure of 3 mmHg. Comparison(s): Changes from prior study are noted. Concerns for cardiac amyloidosis. Concerns for severe calcific MS. FINDINGS  Left Ventricle: Hyperdynamic LV,  intracavitary gradient up to 25 mmHG. Severe concentric LVH. Apical sparing noted on strain imaging. Would consider PYP scan to exclude cardiac amyloidosis. Left ventricular ejection fraction, by estimation, is 65 to 70%. The left ventricle has normal function. Left ventricular endocardial border not optimally defined to evaluate regional wall motion. The left ventricular internal cavity size was normal in size. There is severe concentric left ventricular hypertrophy. Left ventricular diastolic function could not be evaluated due to mitral annular calcification (moderate or greater). Left ventricular diastolic function could not be evaluated. Right Ventricle: The right ventricular size is normal. No increase in right ventricular wall thickness. Right ventricular systolic function is mildly reduced. There is moderately elevated pulmonary artery systolic pressure. The tricuspid regurgitant velocity is 3.39 m/s, and with an assumed right atrial pressure of 3 mmHg, the estimated right ventricular systolic pressure is 10.6 mmHg. Left Atrium: Left atrial size was severely dilated. Right Atrium: Right atrial size was moderately dilated. Pericardium: A small pericardial effusion is present. The pericardial effusion is circumferential. There is no evidence of cardiac tamponade. Mitral Valve: Concerns for severe calcific mitral stenosis. MG 10 mmHG @  80 bpm. MVA by VTI inaccurate due to moderate AI influencing SV assessment. Would consider TEE for clarification. The mitral valve is degenerative in appearance. Moderate to severe mitral annular calcification. Trivial mitral valve regurgitation. Severe mitral valve stenosis. MV peak gradient, 24.0 mmHg. The mean mitral valve gradient is 10.0 mmHg with average heart rate of 80 bpm. Tricuspid Valve: The tricuspid valve is grossly normal. Tricuspid valve regurgitation is mild . No evidence of tricuspid stenosis. Aortic Valve: Calcific aortic valve. Mild to moderate aortic stenosis.  Moderate aortic regurgitation. The aortic valve is tricuspid. There is moderate calcification of the aortic valve. There is moderate thickening of the aortic valve. Aortic valve regurgitation is moderate. Aortic regurgitation PHT measures 486 msec. Mild to moderate aortic stenosis is present. Aortic valve mean gradient measures 19.0 mmHg. Aortic valve peak gradient measures 33.9 mmHg. Aortic valve area, by VTI measures 1.46 cm. Pulmonic Valve: The pulmonic valve was grossly normal. Pulmonic valve regurgitation is mild. No evidence of pulmonic stenosis. Aorta: The aortic root is normal in size and structure. There is moderate dilatation of the ascending aorta, measuring 43 mm. Venous: The inferior vena cava is normal in size with greater than 50% respiratory variability, suggesting right atrial pressure of 3 mmHg. IAS/Shunts: The atrial septum is grossly normal.  LEFT VENTRICLE PLAX 2D LVIDd:         3.30 cm   Diastology LVIDs:         2.60 cm   LV e' medial:    3.37 cm/s LV PW:         1.70 cm   LV E/e' medial:  30.0 LV IVS:        1.70 cm   LV e' lateral:   4.57 cm/s LVOT diam:     1.80 cm   LV E/e' lateral: 22.1 LV SV:         70 LV SV Index:   45 LVOT Area:     2.54 cm  RIGHT VENTRICLE             IVC RV Basal diam:  4.50 cm     IVC diam: 1.70 cm RV Mid diam:    3.00 cm RV S prime:     12.20 cm/s TAPSE (M-mode): 1.7 cm LEFT ATRIUM              Index        RIGHT ATRIUM           Index LA diam:        4.00 cm  2.56 cm/m   RA Area:     21.10 cm LA Vol (A2C):   145.0 ml 92.74 ml/m  RA Volume:   72.10 ml  46.12 ml/m LA Vol (A4C):   80.1 ml  51.23 ml/m LA Biplane Vol: 110.0 ml 70.36 ml/m  AORTIC VALVE AV Area (Vmax):    1.30 cm AV Area (Vmean):   1.28 cm AV Area (VTI):     1.46 cm AV Vmax:           291.00 cm/s AV Vmean:          199.000 cm/s AV VTI:            0.481 m AV Peak Grad:      33.9 mmHg AV Mean Grad:      19.0 mmHg LVOT Vmax:         149.00 cm/s LVOT Vmean:        99.900 cm/s LVOT VTI:  0.276 m LVOT/AV VTI ratio: 0.57 AI PHT:            486 msec  AORTA Ao Root diam: 3.40 cm Ao Asc diam:  4.30 cm MITRAL VALVE                TRICUSPID VALVE MV Area (PHT): 2.96 cm     TR Peak grad:   46.0 mmHg MV Area VTI:   1.27 cm     TR Vmax:        339.00 cm/s MV Peak grad:  24.0 mmHg MV Mean grad:  10.0 mmHg    SHUNTS MV Vmax:       2.45 m/s     Systemic VTI:  0.28 m MV Vmean:      150.0 cm/s   Systemic Diam: 1.80 cm MV VTI:        0.55 m MV Decel Time: 256 msec MV E velocity: 101.00 cm/s MV A velocity: 190.00 cm/s MV E/A ratio:  0.53 Eleonore Chiquito MD Electronically signed by Eleonore Chiquito MD Signature Date/Time: 01/11/2022/1:35:43 PM    Final    US THORACENTESIS ASP PLEURAL SPACE W/IMG GUIDE  Result Date: 01/19/2022 INDICATION: Patient history of congestive heart failure, end-stage renal disease on hemodialysis, recurrent right pleural effusion. Request IR for therapeutic right thoracentesis. EXAM: ULTRASOUND GUIDED RIGHT THORACENTESIS MEDICATIONS: 8 mL 1% lidocaine COMPLICATIONS: None immediate. PROCEDURE: An ultrasound guided thoracentesis was thoroughly discussed with the patient and questions answered. The benefits, risks, alternatives and complications were also discussed. The patient understands and wishes to proceed with the procedure. Written consent was obtained. Ultrasound was performed to localize and mark an adequate pocket of fluid in the right chest. The area was then prepped and draped in the normal sterile fashion. 1% Lidocaine was used for local anesthesia. Under ultrasound guidance a 6 Fr Safe-T-Centesis catheter was introduced. Thoracentesis was performed. The catheter was removed and a dressing applied. FINDINGS: A total of approximately 1.6 L of clear yellow fluid was removed. Procedure aborted at this amount due to patient with persistent cough and discomfort, small amount of pleural fluid remains which is not amenable to repeat thoracentesis at this time. IMPRESSION: Successful  ultrasound guided right thoracentesis yielding 1.6 L of pleural fluid. Read by Candiss Norse, PA-C Electronically Signed   By: Corrie Mckusick D.O.   On: 01/19/2022 07:56    Microbiology: Recent Results (from the past 240 hour(s))  Culture, blood (routine x 2)     Status: None   Collection Time: 12/31/2021  8:47 PM   Specimen: BLOOD  Result Value Ref Range Status   Specimen Description BLOOD BLOOD LEFT HAND  Final   Special Requests   Final    BOTTLES DRAWN AEROBIC AND ANAEROBIC Blood Culture results may not be optimal due to an inadequate volume of blood received in culture bottles   Culture   Final    NO GROWTH 5 DAYS Performed at [facility], [place] [address] [place],  [place] [address]    Report Status 16-Feb-2022 FINAL  Final  Culture, blood (routine x 2)     Status: None   Collection Time: 01/24/2022 11:28 PM   Specimen: BLOOD  Result Value Ref Range Status   Specimen Description BLOOD SITE NOT SPECIFIED  Final   Special Requests   Final    BOTTLES DRAWN AEROBIC AND ANAEROBIC Blood Culture adequate volume   Culture   Final    NO GROWTH 5 DAYS Performed at [facility]  [facility], [address], [place], [place] [address]    Report Status February 11, 2022 FINAL  Final    Time spent: No charge  Signed: Shawna Clamp, MD 02-11-2022

## 2022-01-30 NOTE — Progress Notes (Addendum)
Pt is in Respiratory distress. Dr. Dwyane Dee made aware. Woodward Ku made aware as well

## 2022-01-30 NOTE — Progress Notes (Signed)
Remains on 10 liters HFNC often sating low 90s. Breathing seems improved with no signs of shortness of breath this shift, but did request PRN nebulizer. PRN anxiety and pain meds also utilized; patient asks to get everything he can as soon as he can. Requested his PIV to be removed. Nurse educated patient on purpose of keeping IV. Patient still asked for it to be removed; states if he is no longer getting his meds through IV form he wants it out asap.

## 2022-01-30 NOTE — Progress Notes (Signed)
Informed Sissy Schuyler Amor from [facility] about pt passing away. Pt qualifies for eye and tissue donation. Referral # [id number]

## 2022-01-30 NOTE — Progress Notes (Signed)
Pt pronounced dead at 24 by Wallene Dales, RN and Parks Ranger, RN after the respiratory distress. MD and friend made aware.

## 2022-01-30 NOTE — Procedures (Signed)
Arrived to dialysis with BP of 98/62. Patient had no complains other than breakfast issues. Education was given concerning his constant requests to be removed from treatment early (1.5 to 2 hrs early with each treatment).  Treatment was uneventful when he began complaining that he was having anxiety, his attending was notified and order was received for Xanax prn Q8H.  PT decided (after once again given education on his need for HD) to sign off AMA from hemodialysis. Net UF was 391 mls and BP was 104/75. Medication given: Ativan.  Pt in stable condition upon transfer to room

## 2022-01-30 NOTE — Progress Notes (Signed)
PT Cancellation Note  Patient Details Name: Thomas Robinson MRN: [id number] DOB: 1960/10/07   Cancelled Treatment:    Reason Eval/Treat Not Completed: Other (comment).  Pt was having some medical concerns including high BP but is now in HD and unavailable.  Follow up at another time.   Ramond Dial 02-05-22, 10:52 AM  Mee Hives, PT PhD Acute Rehab Dept. Number: [place]

## 2022-01-30 DEATH — deceased

## 2022-02-02 IMAGING — DX DG CHEST 2V
2 series · 2 of 2 positions shown · non-contrast
Comparison: CT angiogram chest 05/18/2021.

CLINICAL DATA: Chest pain.

EXAM:
CHEST - 2 VIEW

[chest pa]
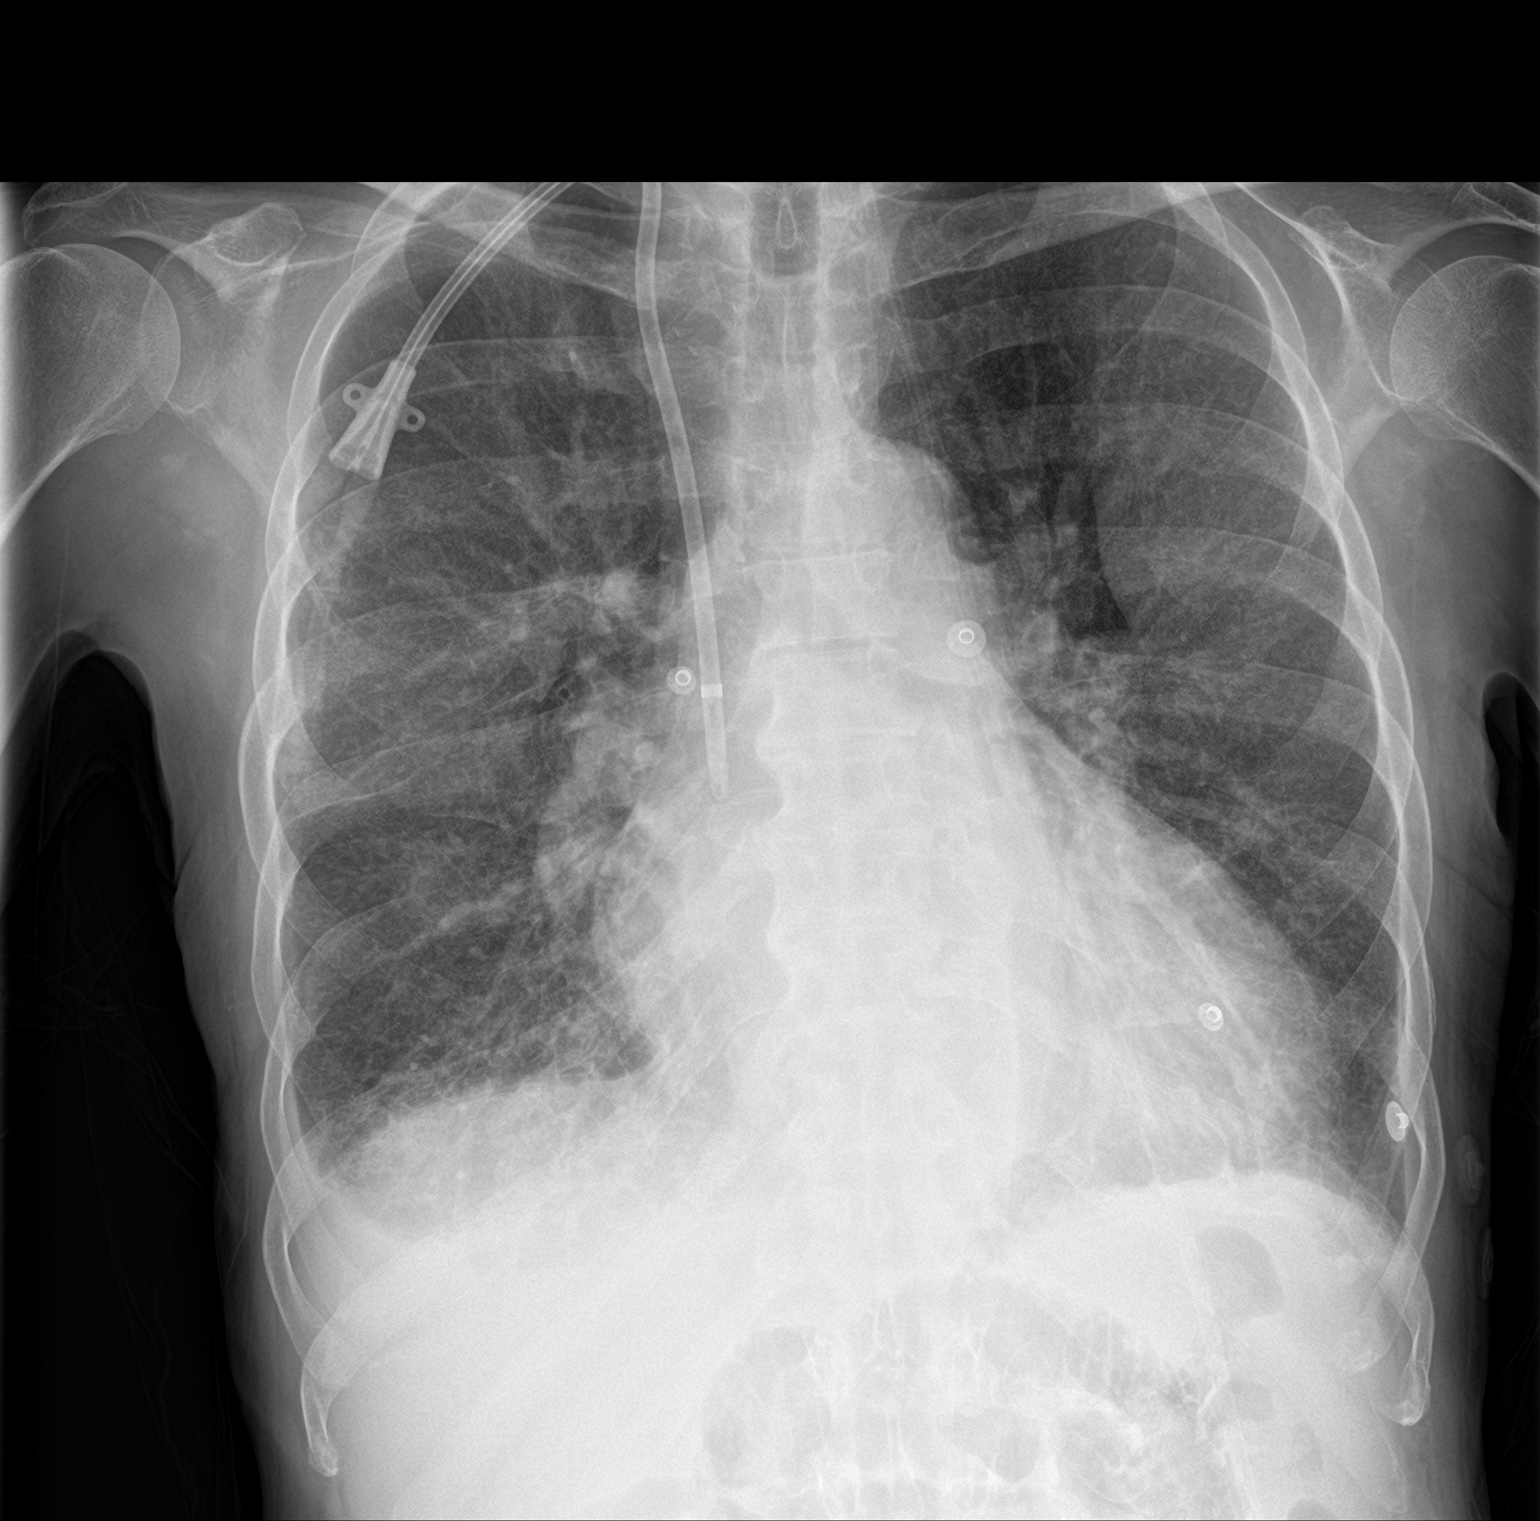

[chest lat]
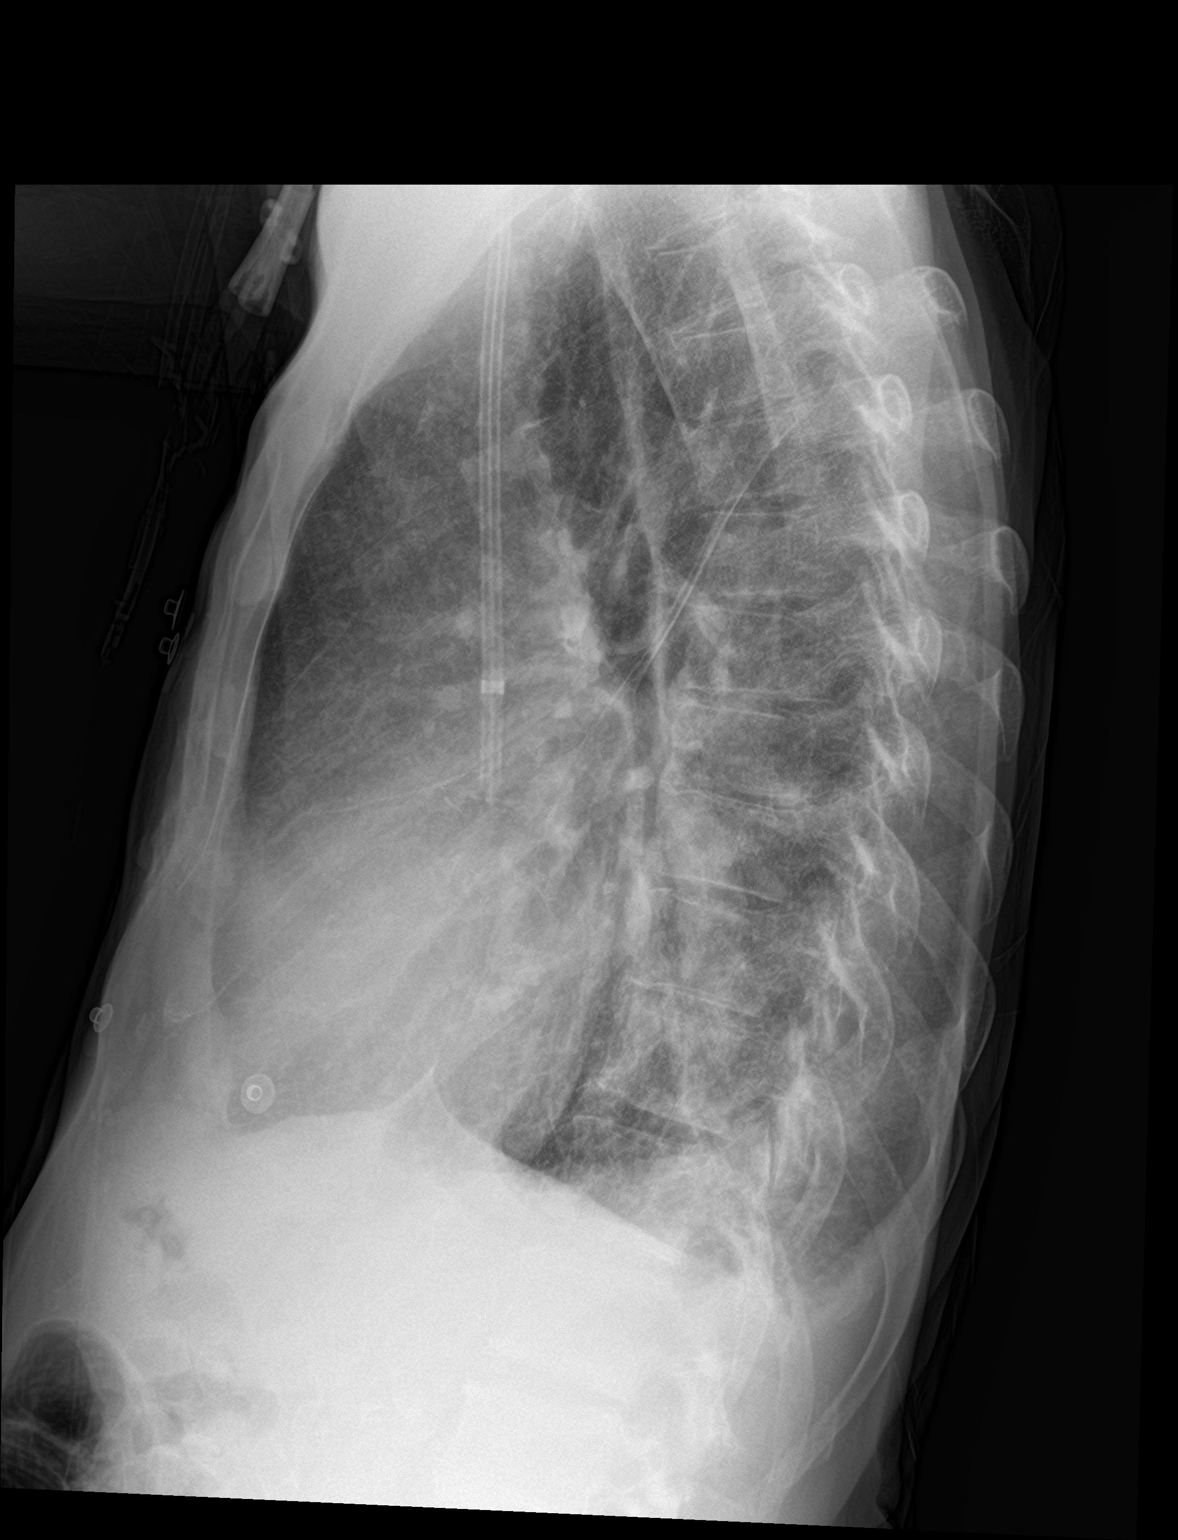

[2 of 2 positions shown; findings below may reference images not displayed]

FINDINGS: Right-sided central venous catheter tip projects over the distal
SVC. The heart is enlarged, unchanged. There are hazy opacities in
both lungs. There is a stable small right pleural effusion. There
some strandy opacities in the left lung base. There is no
pneumothorax.

Cervical spinal fusion plate is again noted. No acute fractures are
seen.
IMPRESSION: 1. Cardiomegaly.
2. Hazy bilateral opacities may represent mild edema.
3. Stable small right pleural effusion.
4. Left basilar atelectasis.

## 2022-02-02 IMAGING — CT CT HEAD W/O CM
4 series · 17 of 47 positions shown, 19 images · non-contrast
Comparison: 02/18/2021

CLINICAL DATA: Headache.  Hypertension.

EXAM:
CT HEAD WITHOUT CONTRAST
TECHNIQUE: Contiguous axial images were obtained from the base of the skull
through the vertex without intravenous contrast.

[Series 3: head bone · axial · 0.41mm/px · z∈[-189,-129]mm · 4 of 86 slices shown]
[im 9/86  bone]
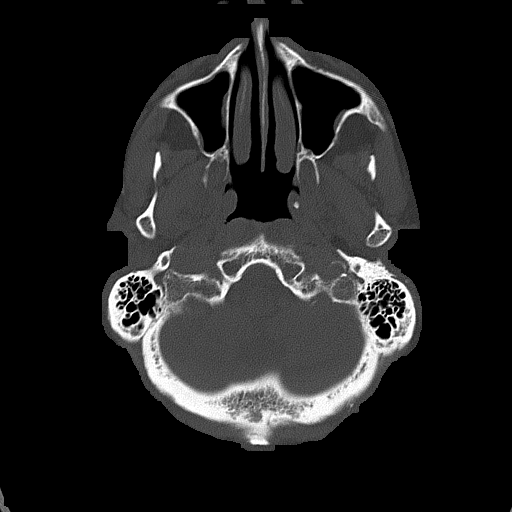
[im 18/86  bone]
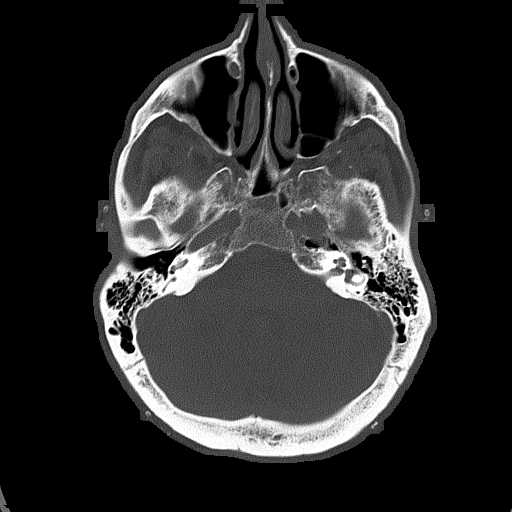
[im 26/86  bone]
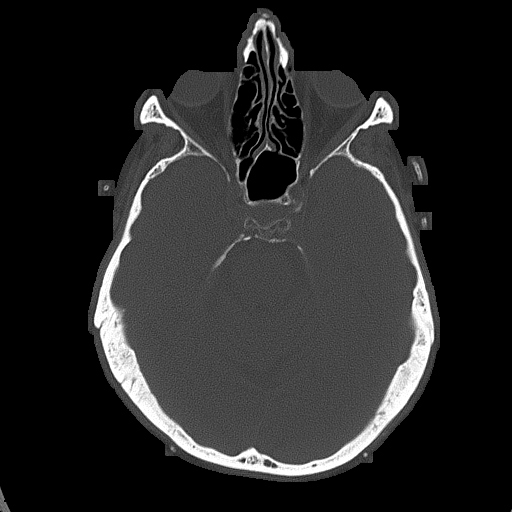
[im 39/86  bone]
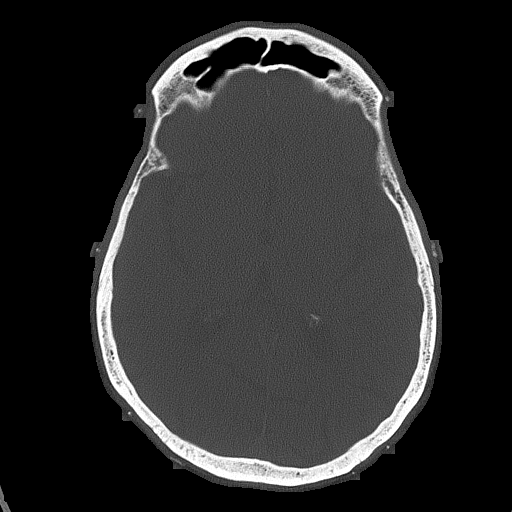

[Series 4: head without · axial · non-contrast · 0.41mm/px · z∈[-185,-60]mm · 7 of 35 slices shown, 9 images]
[im 5/35  brain]
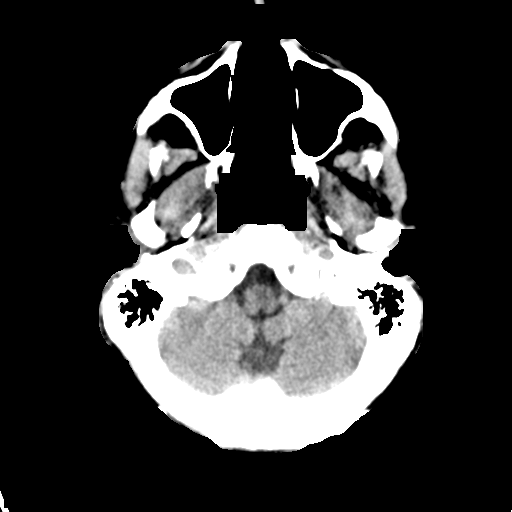
[im 5/35  bone]
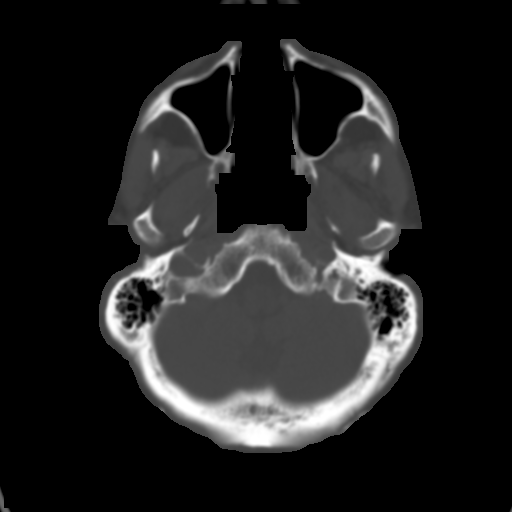
[im 9/35  brain]
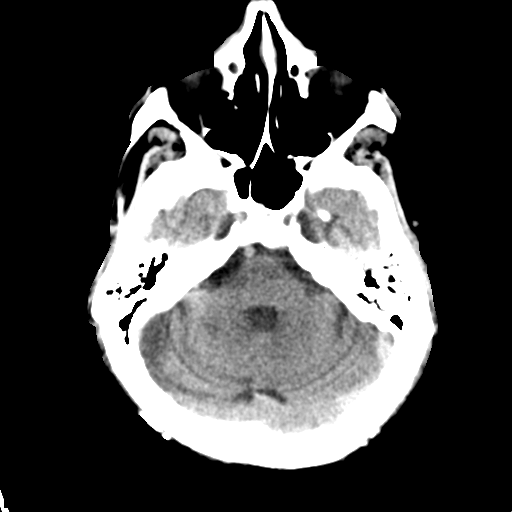
[im 13/35  brain]
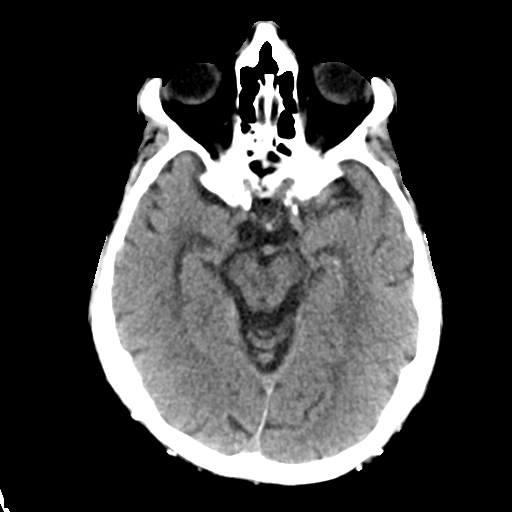
[im 18/35  brain]
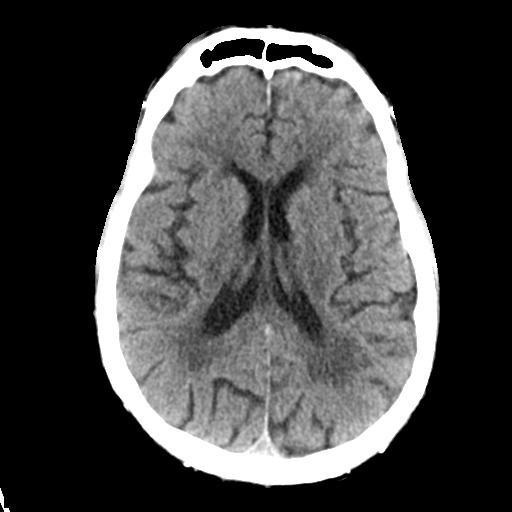
[im 22/35  brain]
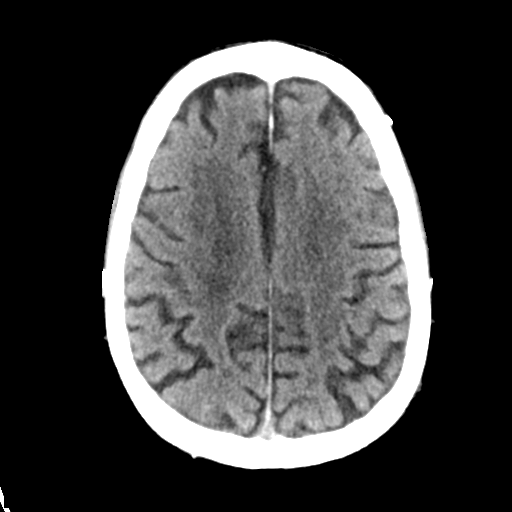
[im 22/35  bone]
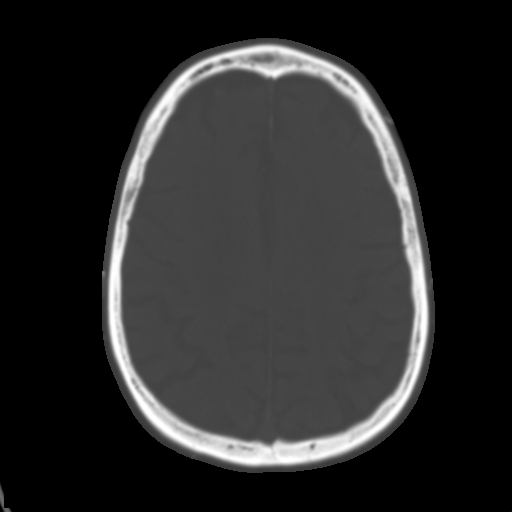
[im 26/35  brain]
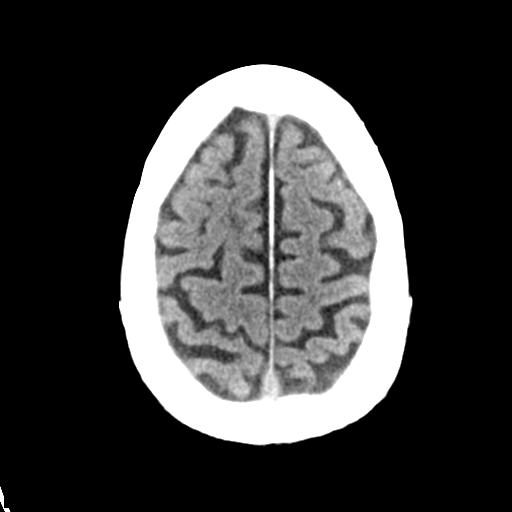
[im 30/35  brain]
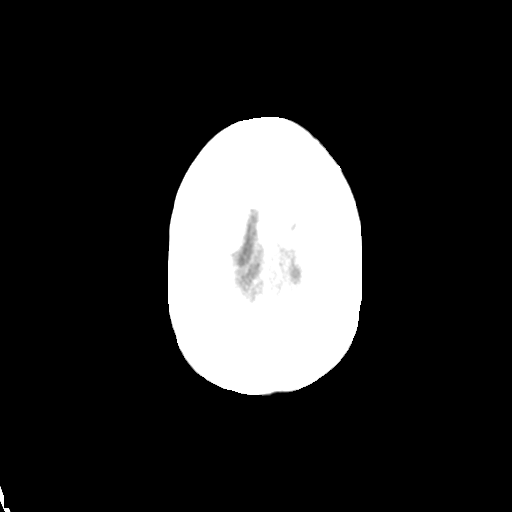

[Series 5: head without cor · coronal · non-contrast · 0.33mm/px · 3 of 67 slices shown]
[im 23/67  brain]
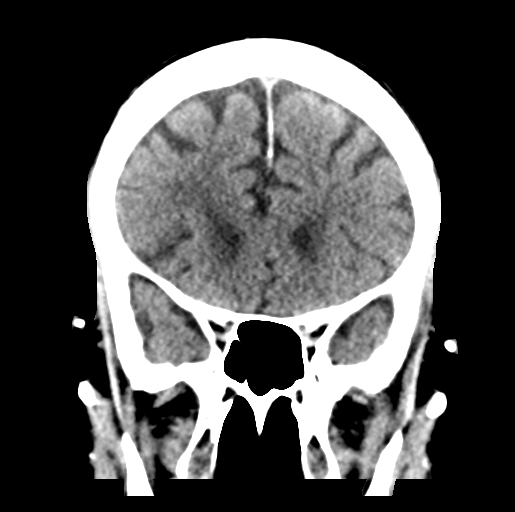
[im 30/67  brain]
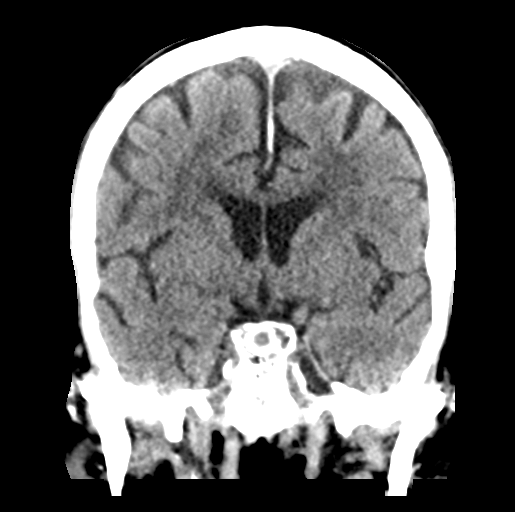
[im 37/67  brain]
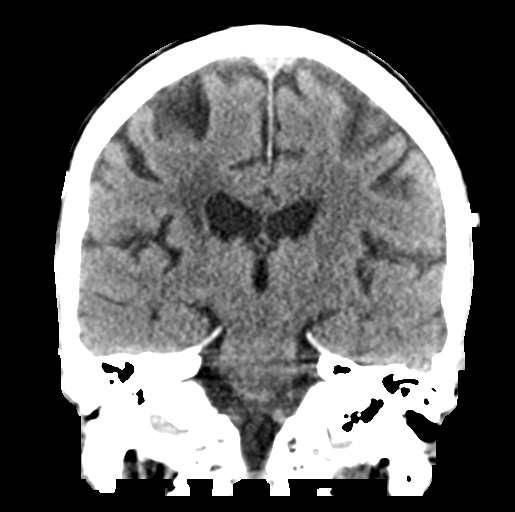

[Series 6: head without sag · sagittal · non-contrast · 0.33mm/px · 3 of 52 slices shown]
[im 18/52  brain]
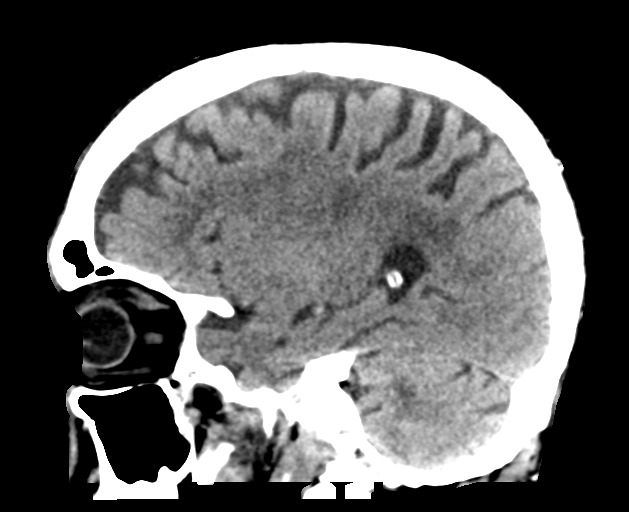
[im 26/52  brain]
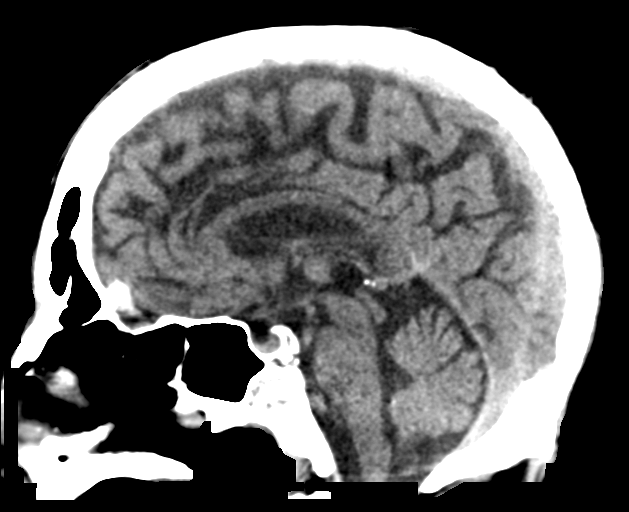
[im 35/52  brain]
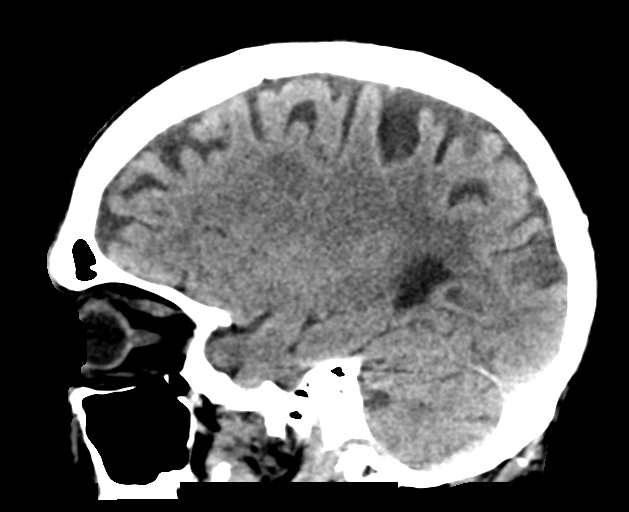

[17 of 47 positions shown; findings below may reference images not displayed]

FINDINGS: Brain: No evidence of acute infarction, hemorrhage, hydrocephalus,
extra-axial collection or mass lesion/mass effect. Remote right
cerebellar hemisphere infarct. There is mild diffuse low-attenuation
within the subcortical and periventricular white matter compatible
with chronic microvascular disease.

Vascular: No hyperdense vessel or unexpected calcification.

Skull: Normal. Negative for fracture or focal lesion.

Sinuses/Orbits: Mild mucosal thickening within the maxillary
sinuses.

Other: None
IMPRESSION: 1. No acute intracranial abnormalities.
2. Chronic small vessel ischemic disease and brain atrophy.

## 2022-03-04 IMAGING — CR DG CHEST 2V
3 series · 3 of 3 positions shown · non-contrast
Comparison: Radiograph 05/31/2019

CLINICAL DATA: Shortness of breath.  Left-sided chest pain.  Cough

EXAM:
CHEST - 2 VIEW

[w chest lat]
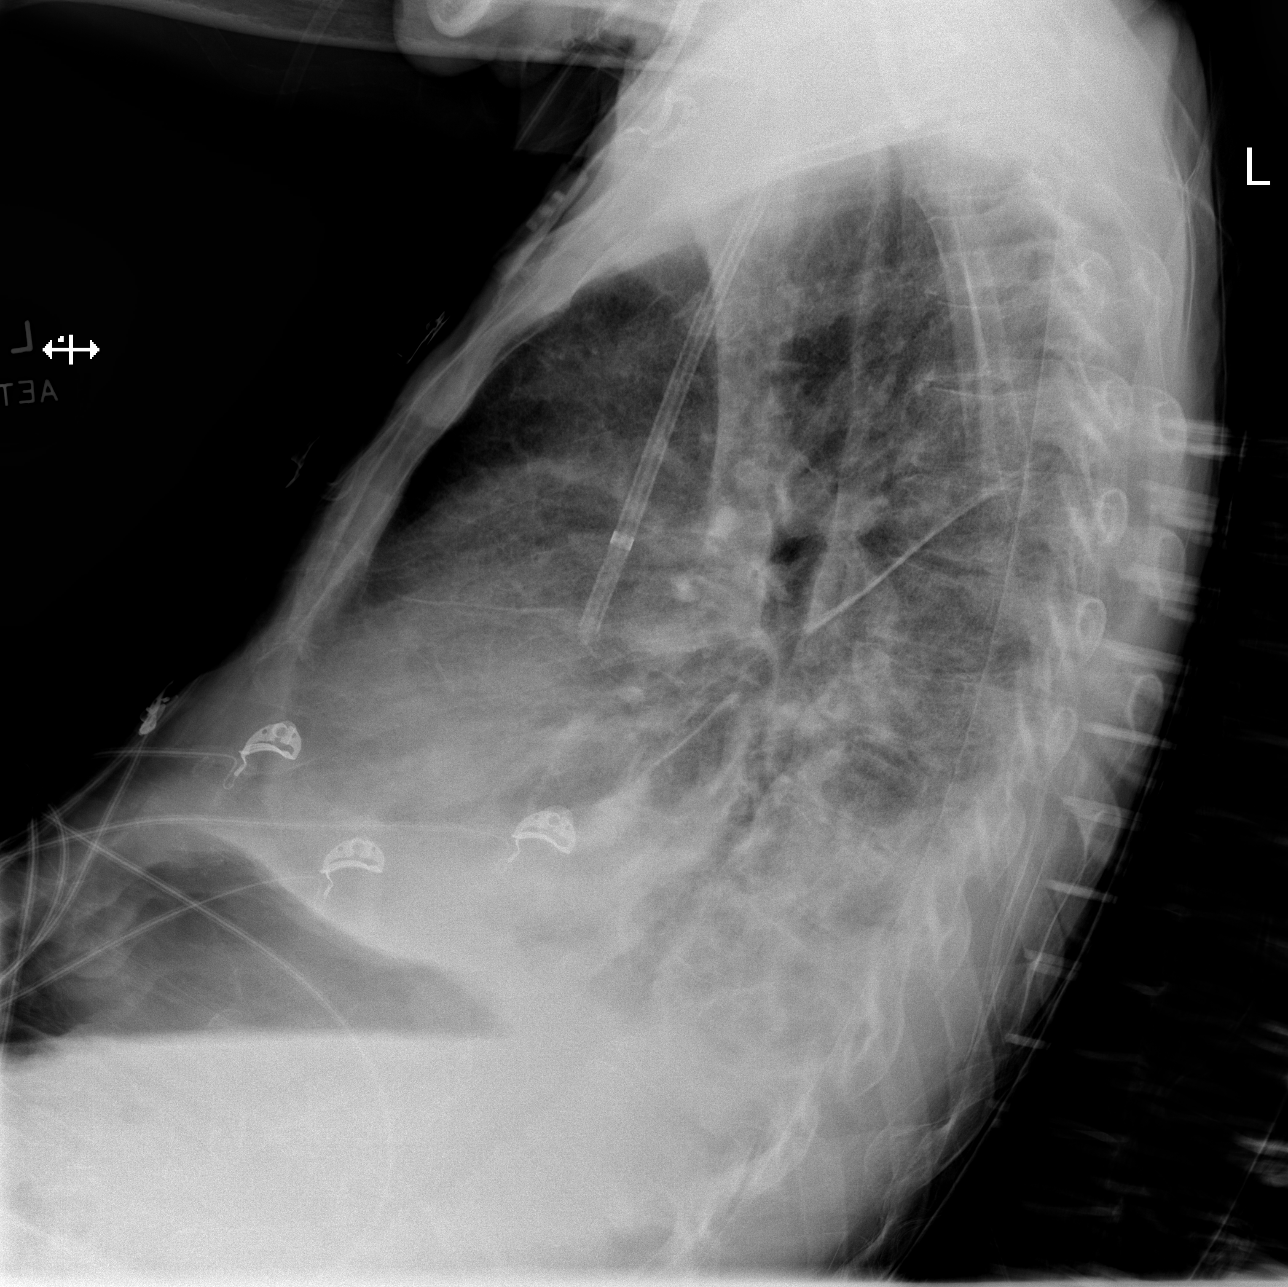

[x chest ap (1 of 2)]
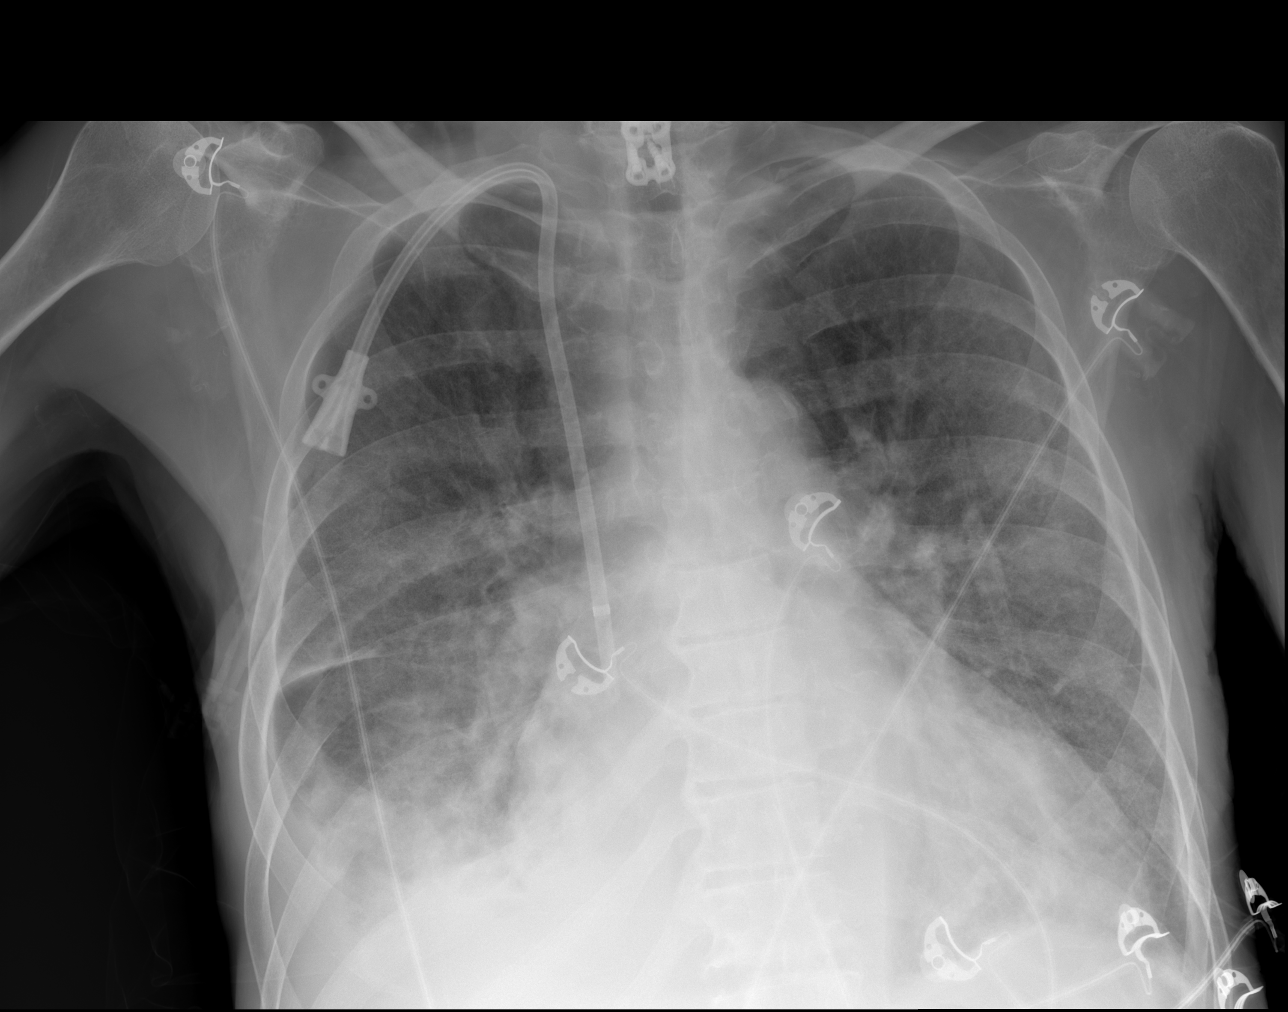

[x chest ap (2 of 2)]
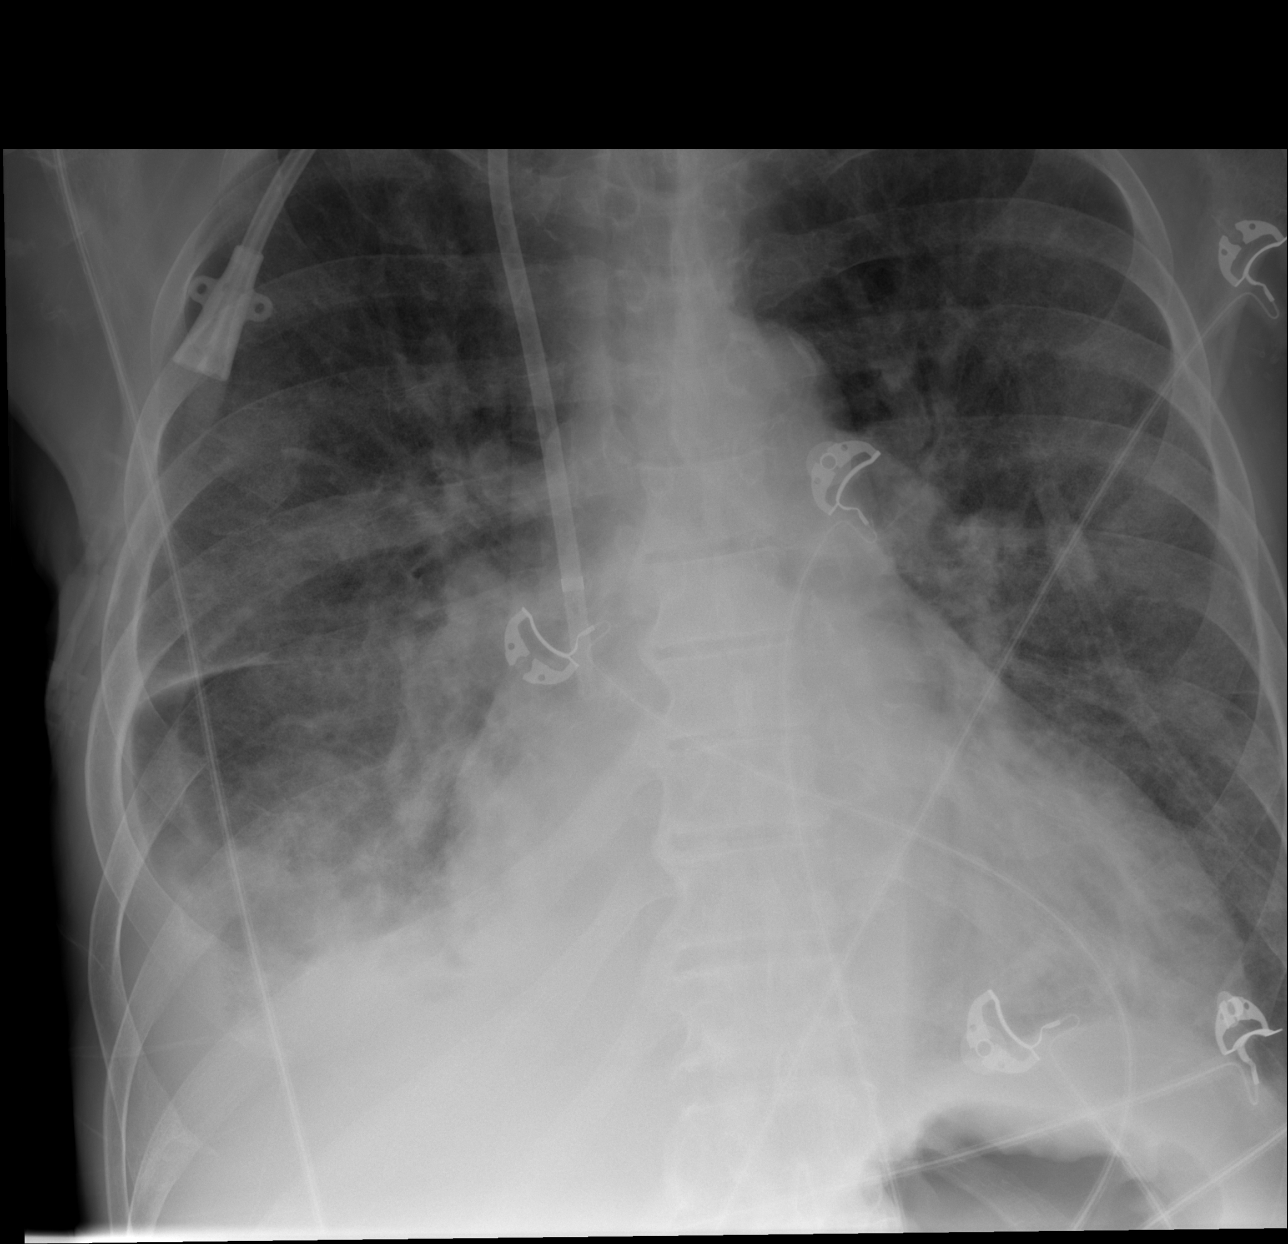

[3 of 3 positions shown; findings below may reference images not displayed]

FINDINGS: Right-sided dialysis catheter remains in place. Cardiomegaly again
seen. Increase in bilateral pleural effusions, right greater than
left. Increased fluid in the right minor fissure. Is peribronchial
and interstitial thickening likely represents pulmonary edema. There
is no pneumothorax.
IMPRESSION: 1. Pulmonary edema and pleural effusions, increased since prior
exam. Findings suggest CHF.
2. Cardiomegaly is unchanged.

## 2022-03-05 IMAGING — CT CT ANGIO CHEST
2 of 6 series · 17 of 36 positions shown · IV contrast (omnipaque)
Comparison: None.

CLINICAL DATA: Chest pain or SOB, pleurisy or effusion suspected.
Chest pain, dyspnea, cough. Hypoxia.

EXAM:
CT ANGIOGRAPHY CHEST WITH CONTRAST
TECHNIQUE: Multidetector CT imaging of the chest was performed using the
standard protocol during bolus administration of intravenous
contrast. Multiplanar CT image reconstructions and MIPs were
obtained to evaluate the vascular anatomy.
CONTRAST:  75mL OMNIPAQUE IOHEXOL 350 MG/ML SOLN

[Series 5: thins · axial · 0.69mm/px · z∈[+1479,+1776]mm · 16 of 335 slices shown]
[im 19/335  lung]
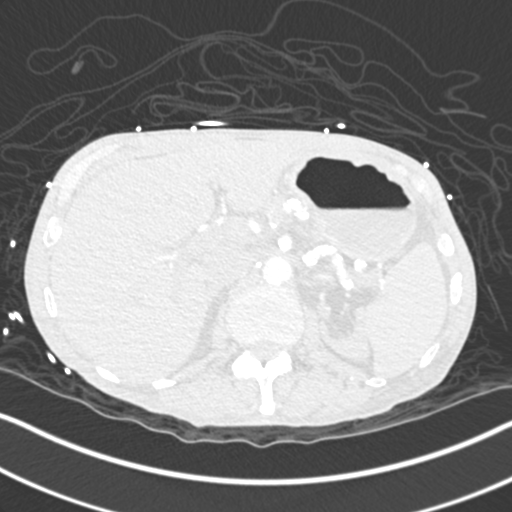
[im 38/335  mediastinal]
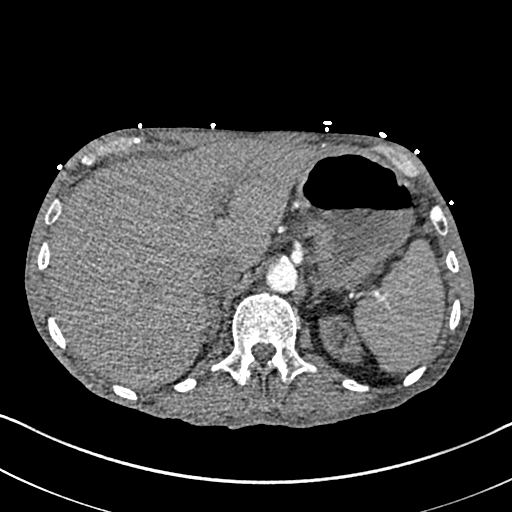
[im 56/335  lung]
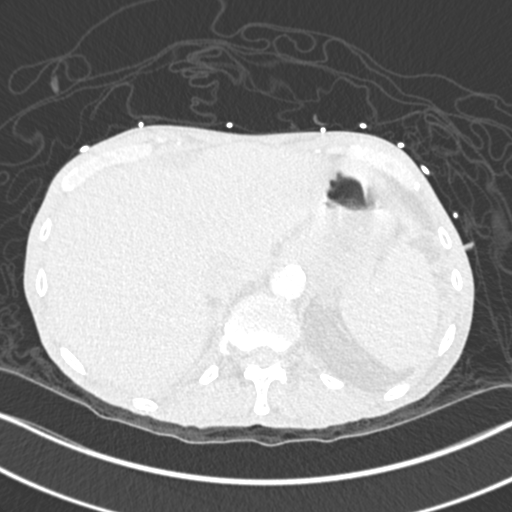
[im 75/335  mediastinal]
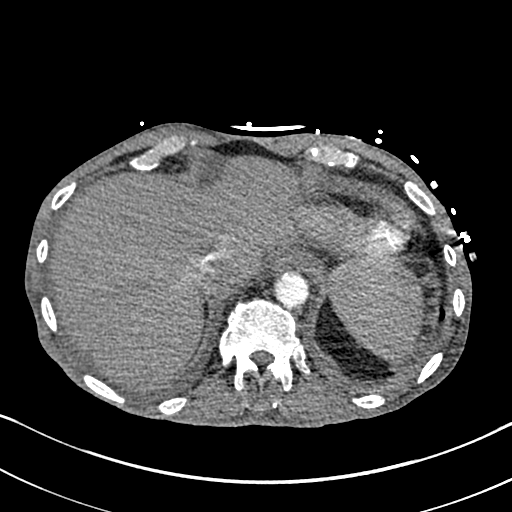
[im 93/335  lung]
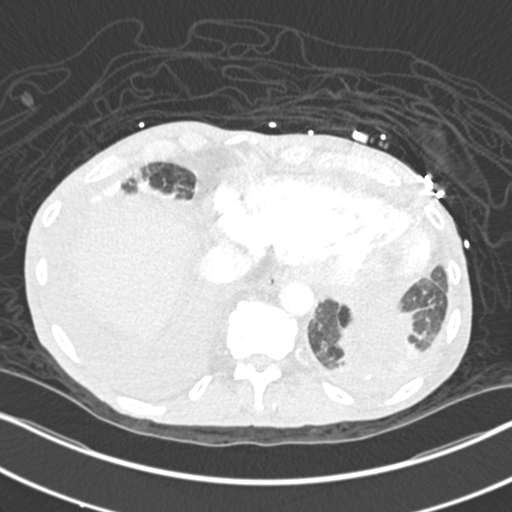
[im 112/335  mediastinal]
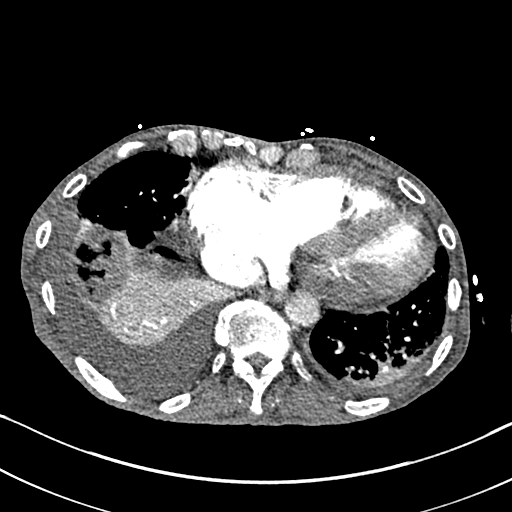
[im 130/335  lung]
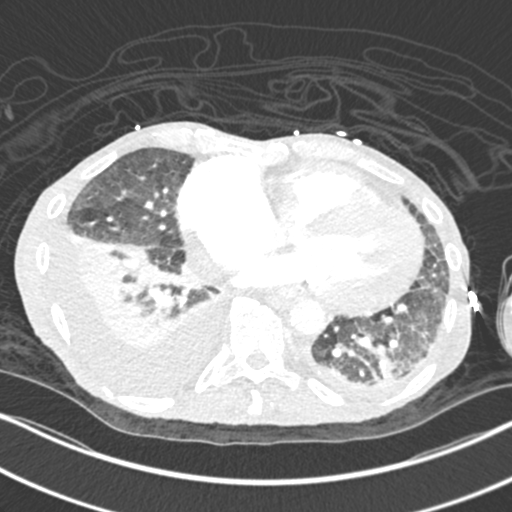
[im 149/335  mediastinal]
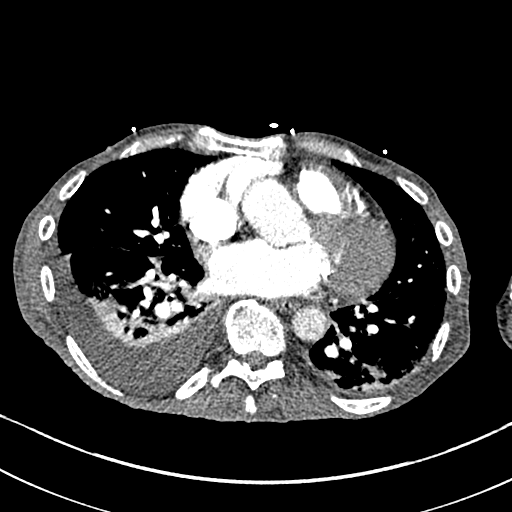
[im 186/335  lung]
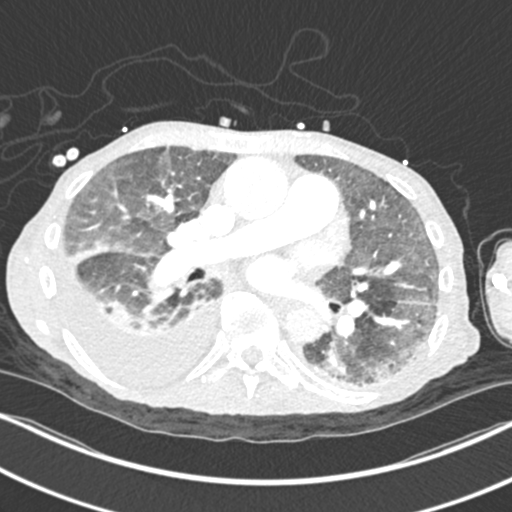
[im 205/335  mediastinal]
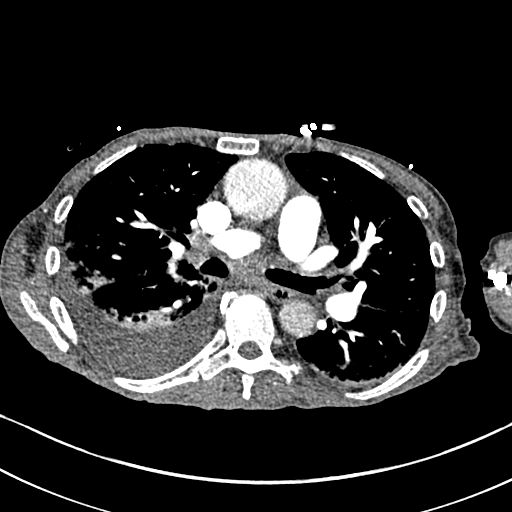
[im 223/335  lung]
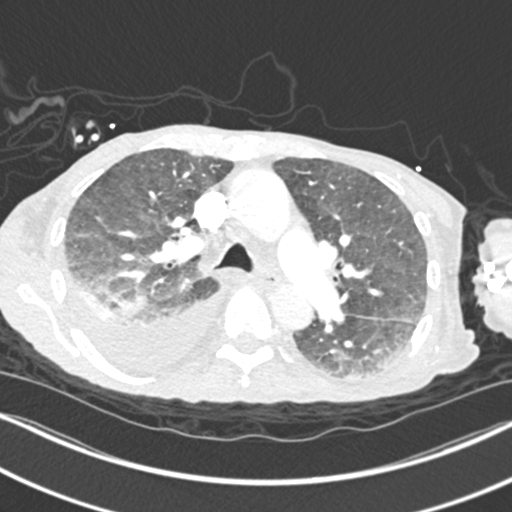
[im 242/335  mediastinal]
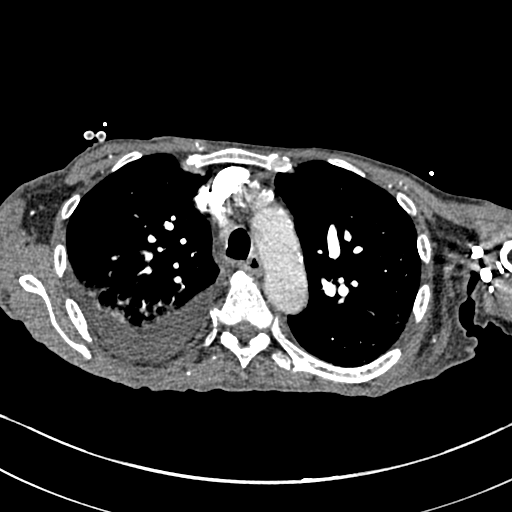
[im 260/335  lung]
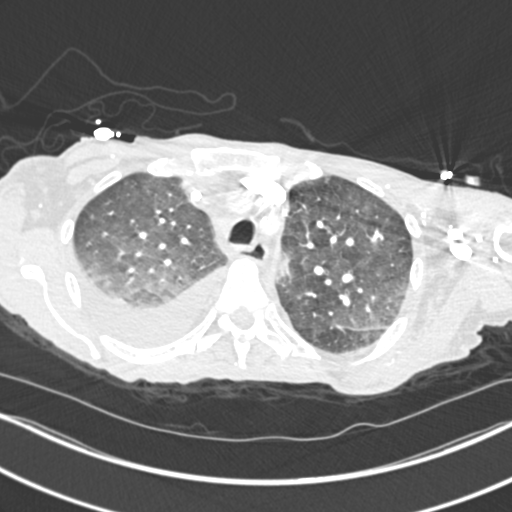
[im 279/335  mediastinal]
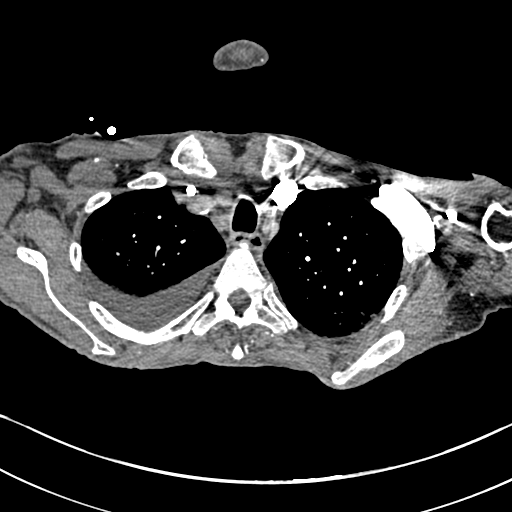
[im 297/335  lung]
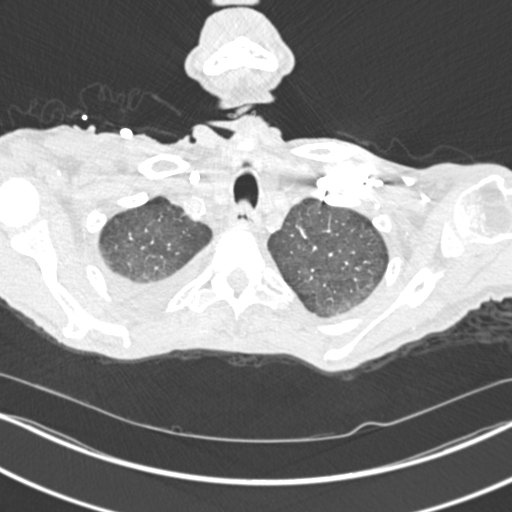
[im 316/335  mediastinal]
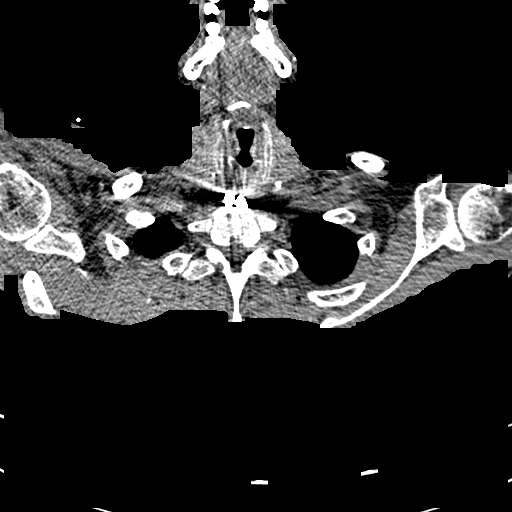

[Series 7: coronal mpr · coronal · 0.68mm/px · 1 of 119 slices shown]
[im 60/119  mediastinal]
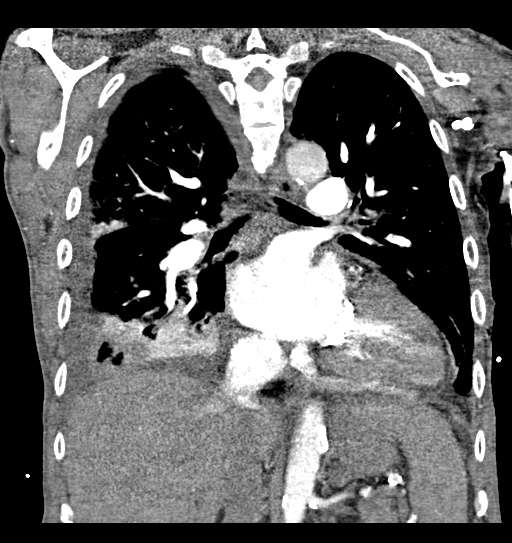

[17 of 36 positions shown; findings below may reference images not displayed]

FINDINGS: Cardiovascular: Adequate opacification of the pulmonary arterial
tree. No intraluminal filling defect identified to suggest acute
pulmonary embolism. Central pulmonary arteries are of normal
caliber.

Extensive multi-vessel coronary artery calcification. Mild global
cardiomegaly with left ventricular hypertrophy noted. Small
pericardial effusion present. Mild atherosclerotic calcification
within the thoracic aorta. The thoracic aorta is dilated in its
ascending segment measuring 4.2 cm in greatest dimension. The
descending thoracic aorta is of normal caliber. Right internal
jugular hemodialysis catheter tip noted within the superior
cavoatrial junction.

Mediastinum/Nodes: Visualized thyroid is unremarkable. No pathologic
thoracic adenopathy. Esophagus is unremarkable.

Lungs/Pleura: Moderate right and trace left pleural effusions are
present. There is diffuse ground-glass pulmonary infiltrate and
smooth interlobular septal thickening in keeping with probable
diffuse alveolar and pulmonary interstitial edema. Widespread
pneumonic or inflammatory infiltrate is possible but considered less
likely. There is, however, collapse and consolidation of the right
lower lobe. There is a superimposed rounded region of
hypoenhancement within the a right lower lobe, best seen on image #
112/4, suspicious for a a region of parenchymal necrosis in the
setting of necrotizing pneumonia or, less likely, a acute to
subacute pulmonary infarct. This measures 4.0 x 5.0 cm on axial
image # 112/4. no pneumothorax. No central obstructing lesion peer

Upper Abdomen: Marked atrophy of the visualized left kidney in
keeping with changes of chronic renal insufficiency. No acute
abnormality.

Musculoskeletal: No acute bone abnormality. No lytic or blastic bone
lesion.

Review of the MIP images confirms the above findings.
IMPRESSION: Collapse and consolidation of the right lower lobe in keeping with
changes of lobar pneumonia, with superimposed area of
hypoenhancement suspicious for an area of parenchymal necrosis. Less
likely, this may represent a acute to subacute infarct. No pulmonary
embolism, however, is identified.

Superimposed diffuse ground-glass pulmonary infiltrate most
suggestive of diffuse alveolar pulmonary edema. Moderate right
pleural effusion is nonspecific and may relate to volume overload or
represent a parapneumonic effusion.

Mild cardiomegaly with left ventricular hypertrophy. Extensive
coronary artery calcification. Small pericardial effusion.

Dilation of the ascending aorta with maximal dimension of 4.2 cm.
Recommend annual imaging followup by CTA or MRA. This recommendation
follows 6545 ACCF/AHA/AATS/ACR/ASA/SCA/SIGIFREDO/TORCHIO/ELISKA/ROBENSON Guidelines
for the Diagnosis and Management of Patients with Thoracic Aortic
Disease. Circulation. 6545; 121: E266-e369. Aortic aneurysm NOS
([id number].H)

## 2022-03-06 IMAGING — CT CT HEAD W/O CM
4 series · 17 of 47 positions shown, 19 images · non-contrast
Comparison: CT head 05/30/2021.  MRI brain 04/09/2015.

CLINICAL DATA: Seizure, abnormal neuro exam.

EXAM:
CT HEAD WITHOUT CONTRAST
TECHNIQUE: Contiguous axial images were obtained from the base of the skull
through the vertex without intravenous contrast.

[Series 3: head bone · axial · 0.41mm/px · z∈[-166,-102]mm · 4 of 90 slices shown]
[im 9/90  bone]
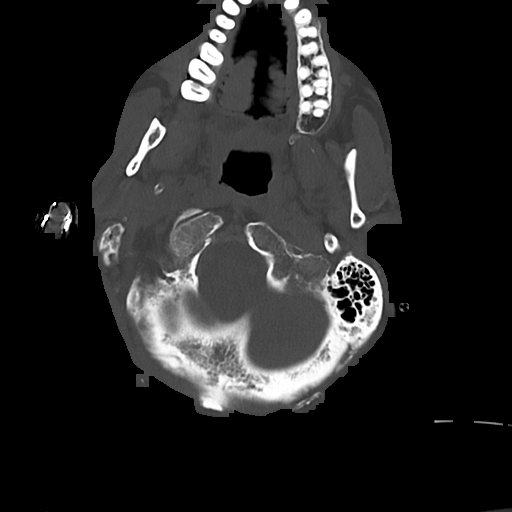
[im 18/90  bone]
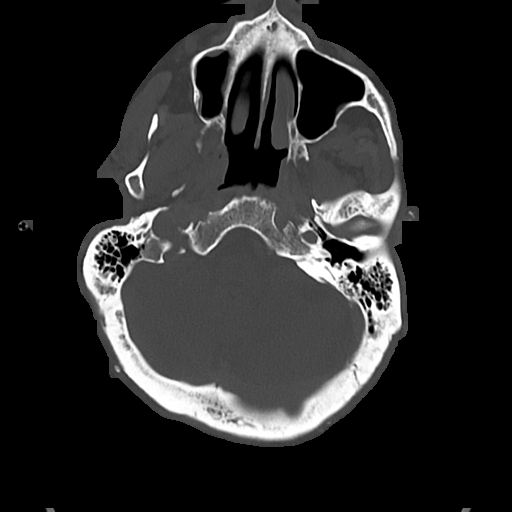
[im 27/90  bone]
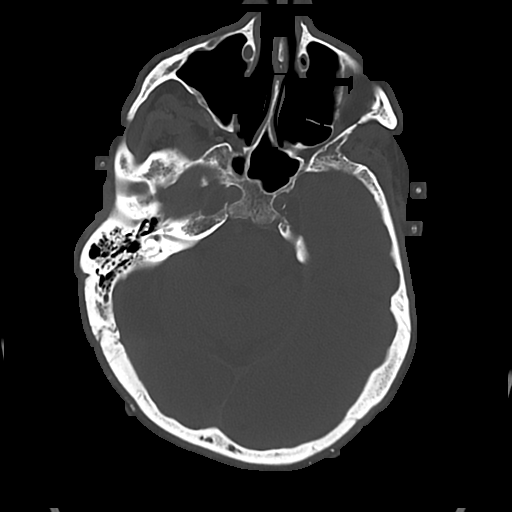
[im 41/90  bone]
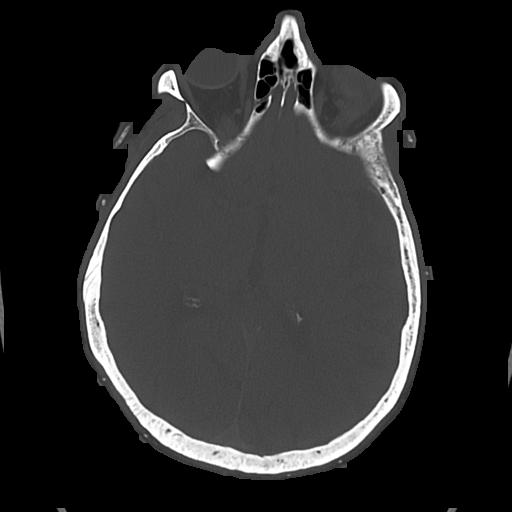

[Series 4: head wo · axial · 0.41mm/px · z∈[-162,-32]mm · 7 of 36 slices shown, 9 images]
[im 5/36  brain]
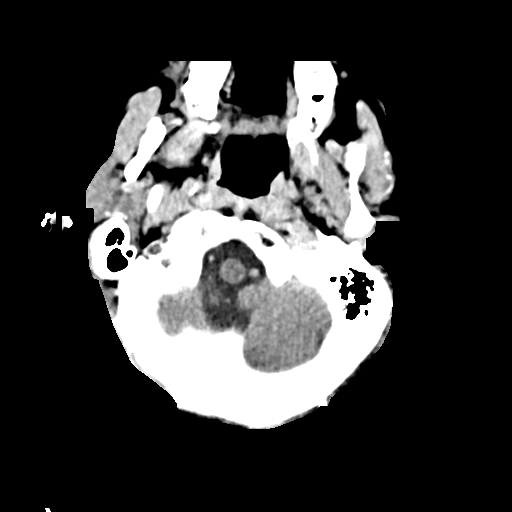
[im 5/36  bone]
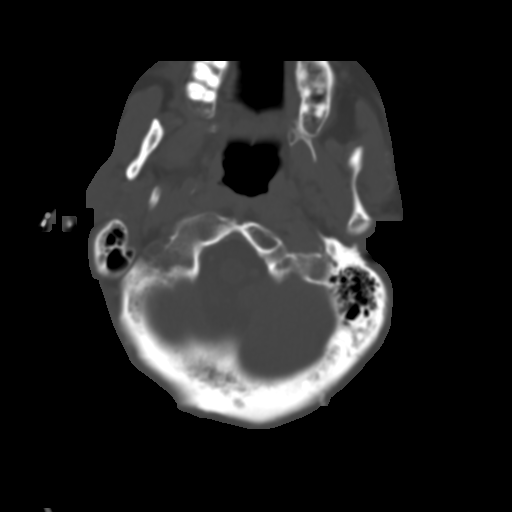
[im 9/36  brain]
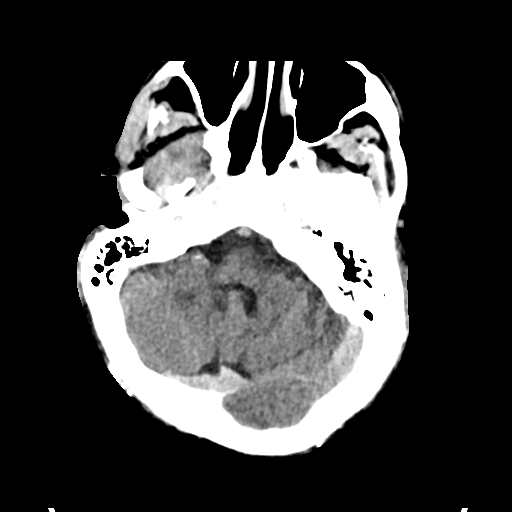
[im 14/36  brain]
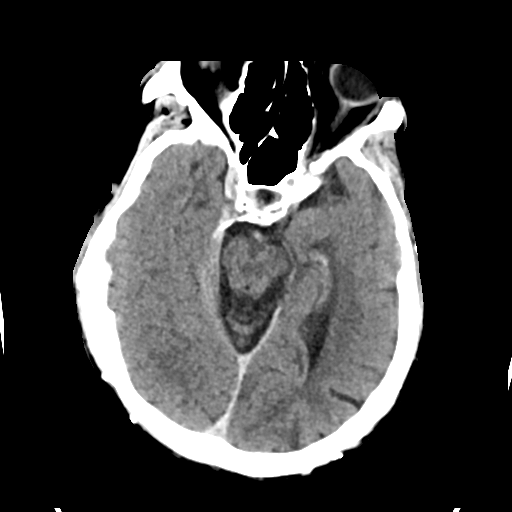
[im 18/36  brain]
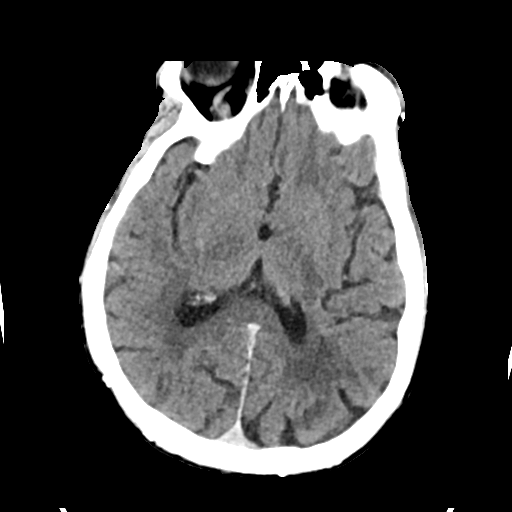
[im 22/36  brain]
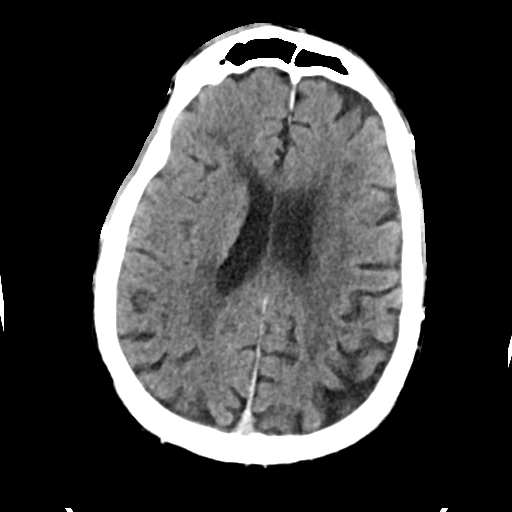
[im 22/36  bone]
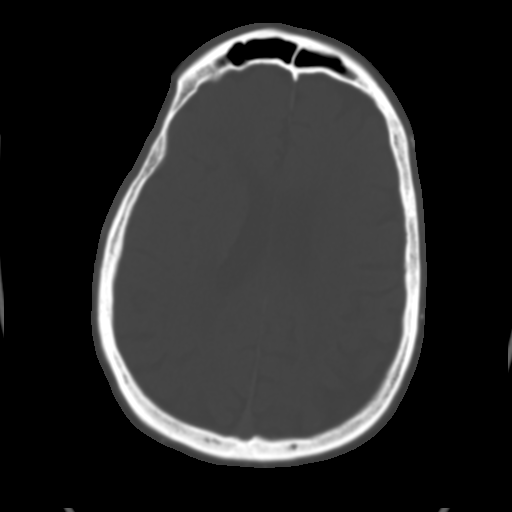
[im 27/36  brain]
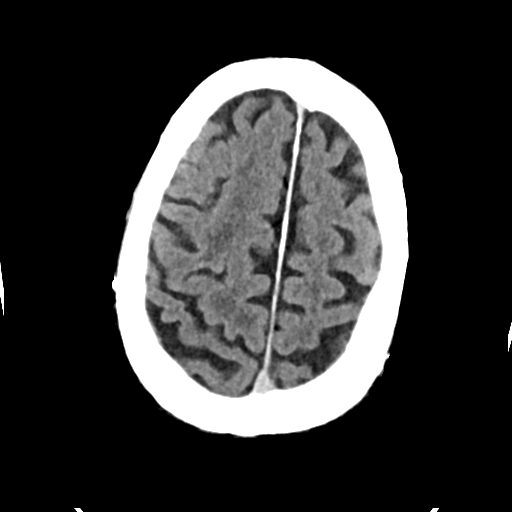
[im 31/36  brain]
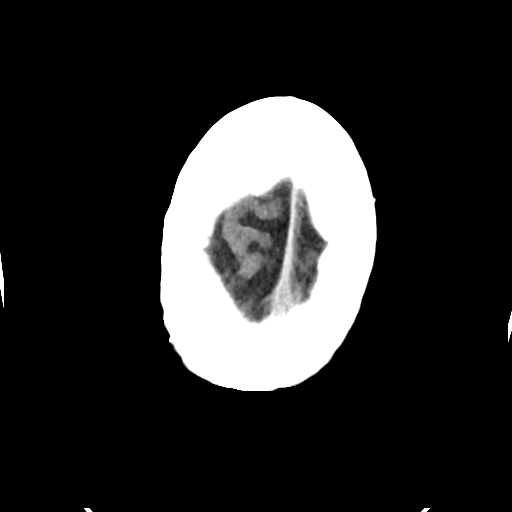

[Series 5: cor soft · coronal · 0.33mm/px · 3 of 68 slices shown]
[im 23/68  brain]
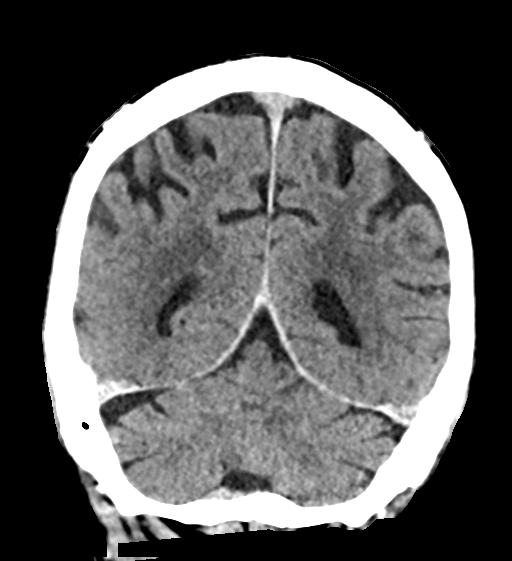
[im 30/68  brain]
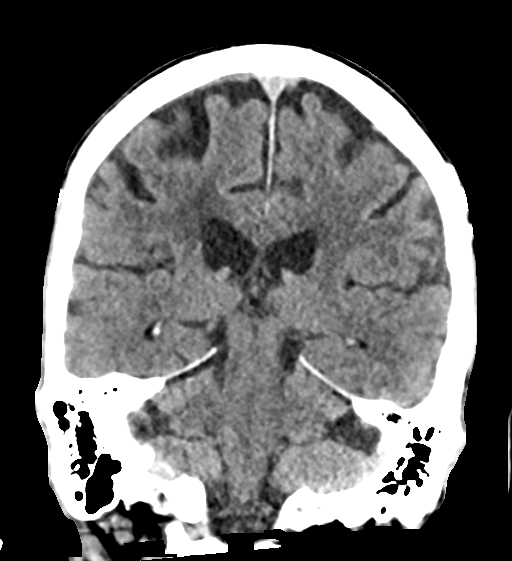
[im 38/68  brain]
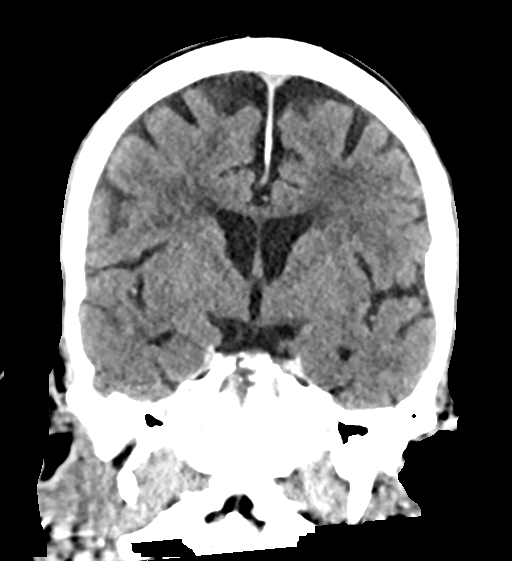

[Series 6: sag soft · sagittal · 0.36mm/px · 3 of 53 slices shown]
[im 19/53  brain]
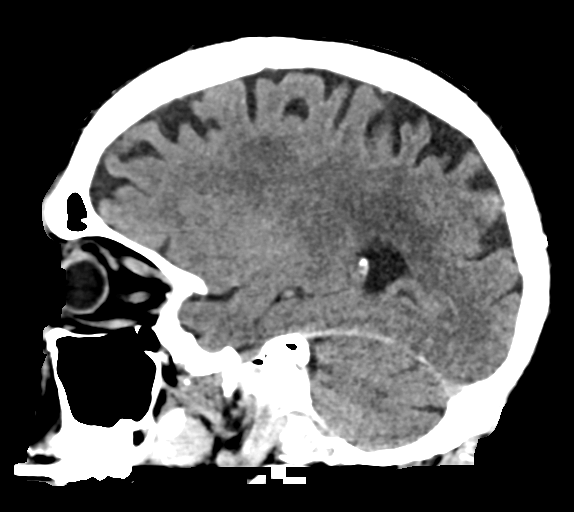
[im 27/53  brain]
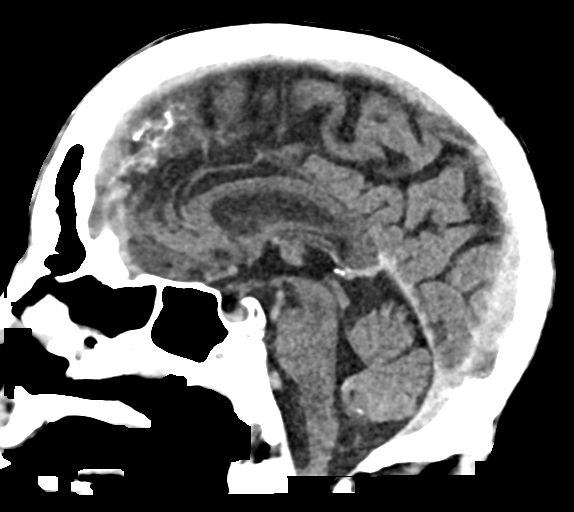
[im 34/53  brain]
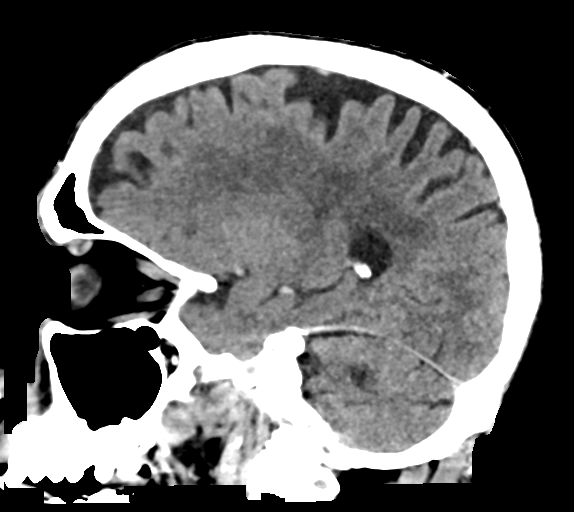

[17 of 47 positions shown; findings below may reference images not displayed]

FINDINGS: Brain: There has been recent contrast administration. No evidence of
acute infarction, hemorrhage, hydrocephalus, extra-axial collection
or mass lesion/mass effect. Again seen is mild diffuse atrophy and
mild periventricular white matter hypodensity, likely chronic small
vessel ischemic change.

Vascular: Atherosclerotic calcifications are present within the
cavernous internal carotid arteries.

Skull: Normal. Negative for fracture or focal lesion.

Sinuses/Orbits: No acute finding.

Other: None.
IMPRESSION: No acute intracranial abnormality.

## 2022-03-06 IMAGING — DX DG ABDOMEN 1V
1 series · 1 of 1 positions shown · non-contrast
Comparison: None.

CLINICAL DATA: Vomiting

EXAM:
ABDOMEN - 1 VIEW

[abdomen kub]
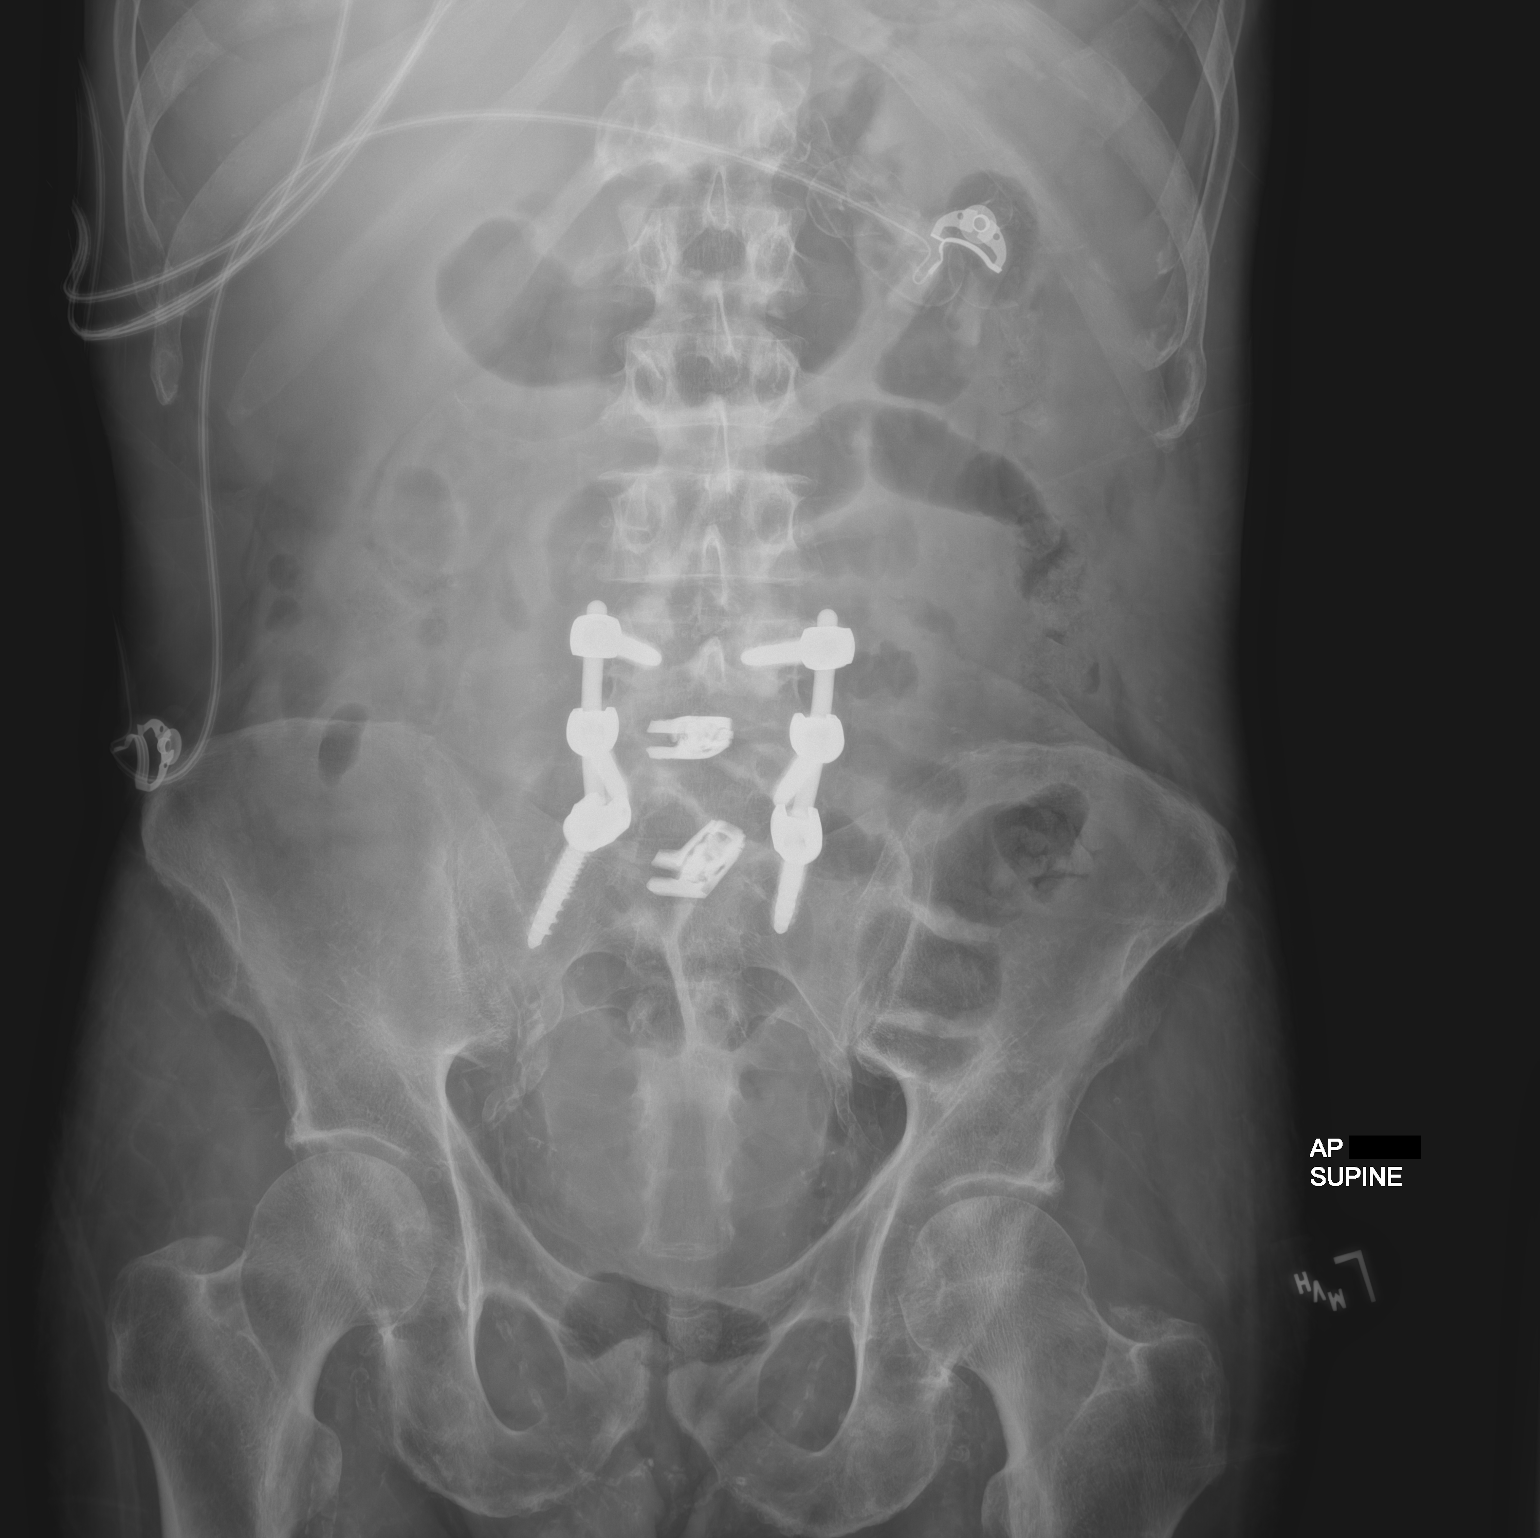

[1 of 1 positions shown; findings below may reference images not displayed]

FINDINGS: Bowel-gas pattern appears within normal limits. No abnormally
distended gas-filled loops of bowel to suggest obstruction.

No suspicious calcifications identified.
IMPRESSION: No acute process identified.

## 2022-03-07 IMAGING — DX DG CHEST 1V PORT
1 series · 1 of 1 positions shown · non-contrast
Comparison: Portable exam 9616 hours compared to 06/29/2021

CLINICAL DATA: Weakness, pulmonary edema

EXAM:
PORTABLE CHEST 1 VIEW

[chest]
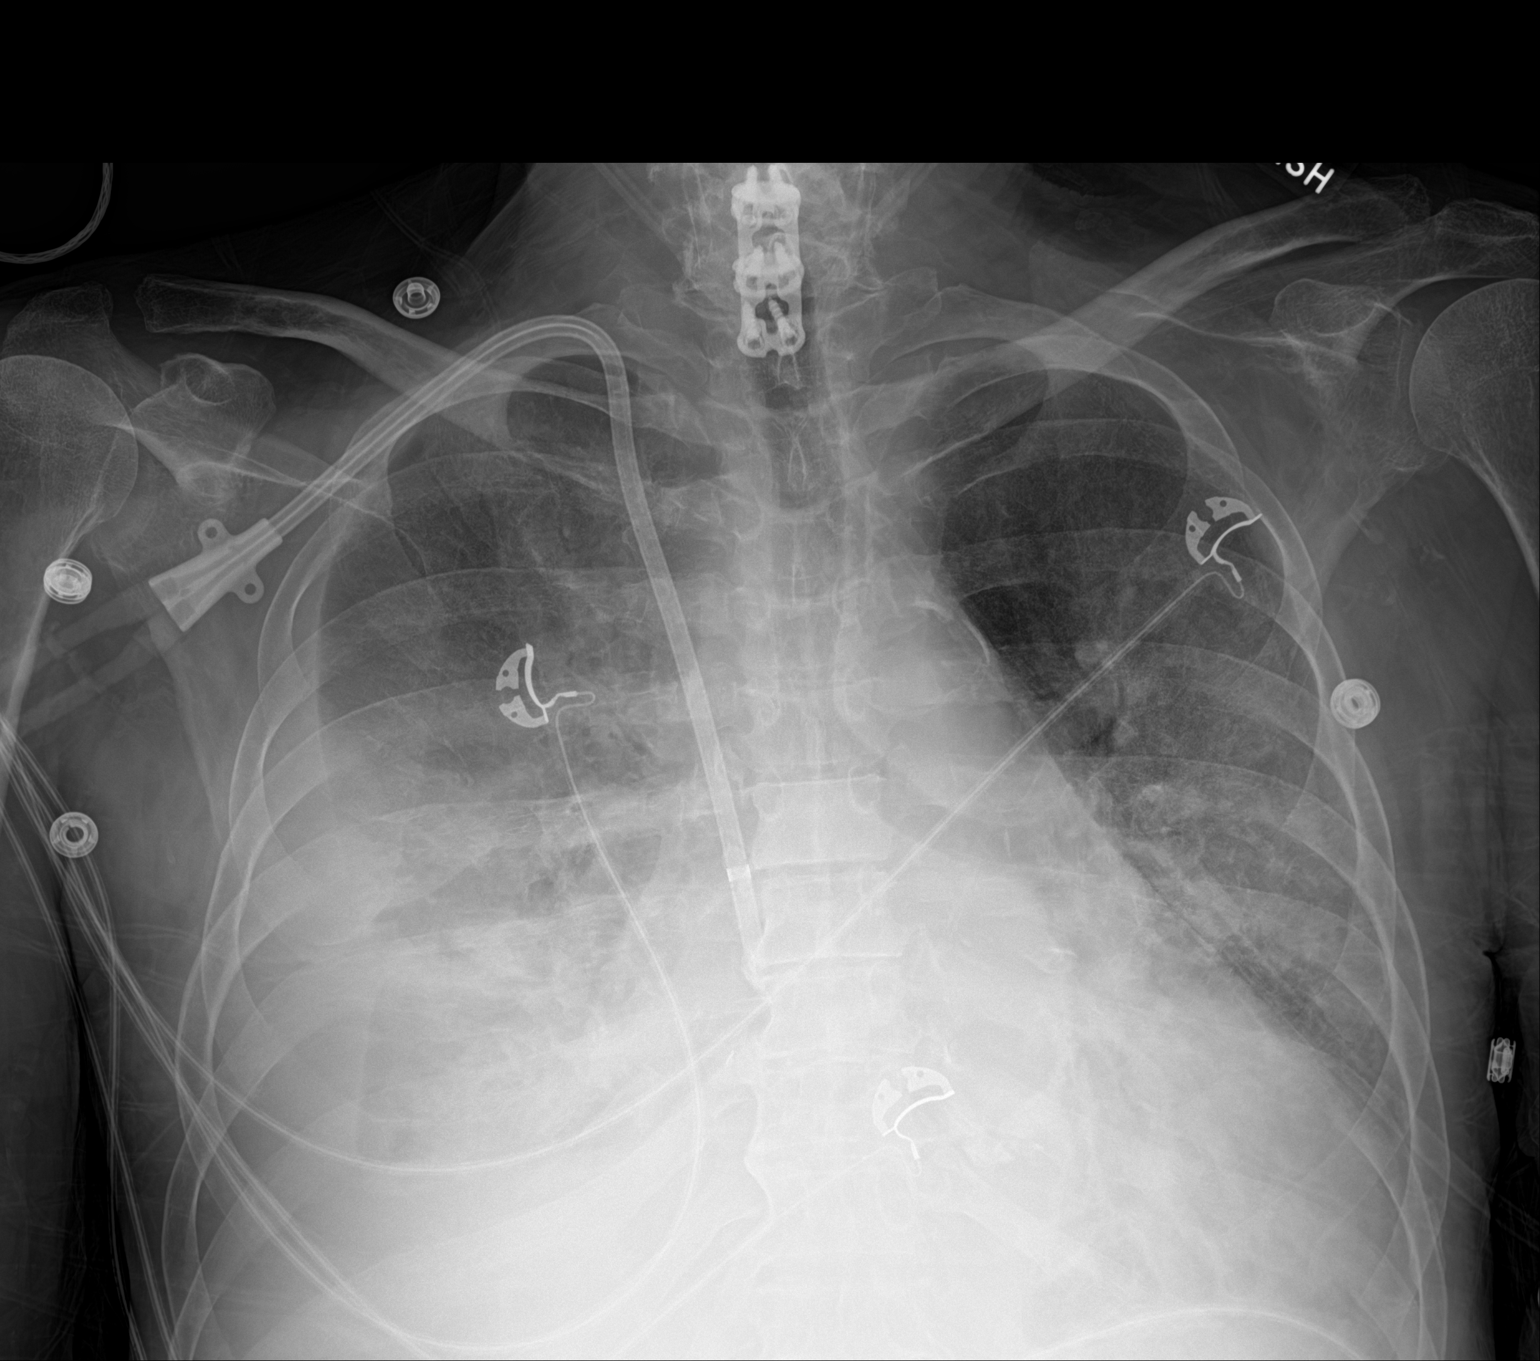

[1 of 1 positions shown; findings below may reference images not displayed]

FINDINGS: RIGHT jugular dual lumen central venous catheter with tip projecting
over SVC above cavoatrial junction.

Enlargement of cardiac silhouette with pulmonary vascular
congestion.

Diffuse infiltrates consistent with pulmonary edema.

Moderate RIGHT pleural effusion.

No pneumothorax.

Atherosclerotic calcification aorta.

Bones demineralized with prior cervical spine fusion.
IMPRESSION: Pulmonary infiltrate with increased RIGHT pleural effusion since
previous exam.

Aortic Atherosclerosis ([id number].N).

## 2022-03-07 IMAGING — DX DG CHEST 1V PORT
1 series · 1 of 1 positions shown · non-contrast
Comparison: 07/02/2021

CLINICAL DATA: Status post right thoracentesis.

EXAM:
PORTABLE CHEST 1 VIEW

[chest ap]
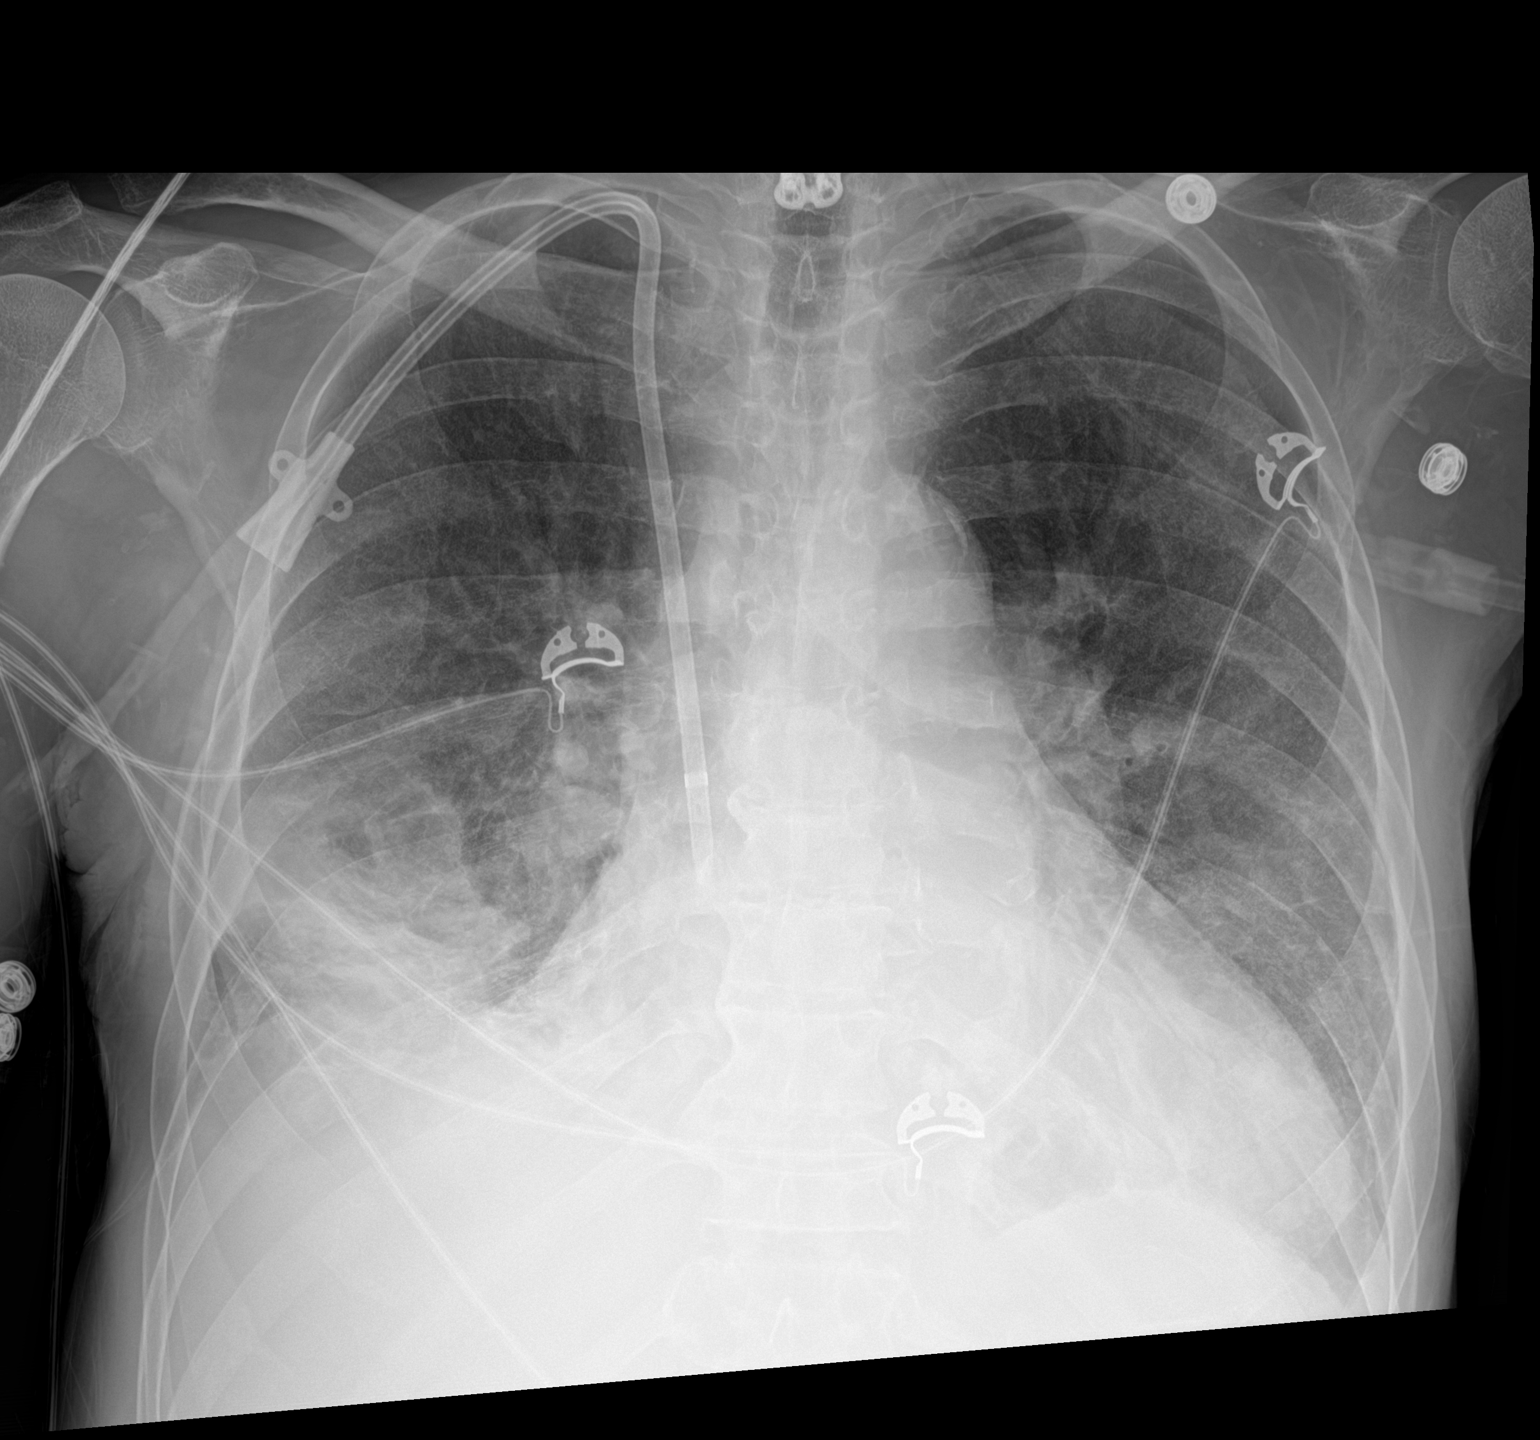

[1 of 1 positions shown; findings below may reference images not displayed]

FINDINGS: There is a right chest wall dialysis catheter with tips in the
cavoatrial junction. Heart size and mediastinal contours are stable.
Decreased volume of the right pleural effusion status post
thoracentesis. No pneumothorax identified. Atelectasis is identified
within the right lung base. Similar appearance of mild bilateral
hazy lung opacities throughout both lungs.
IMPRESSION: 1. Decrease in volume of right pleural effusion status post
thoracentesis.
2. No pneumothorax.

## 2022-03-09 IMAGING — US US ABDOMEN LIMITED
1 series · 14 of 25 positions shown · non-contrast
Comparison: April 09, 2015

CLINICAL DATA: Transaminitis.

EXAM:
ULTRASOUND ABDOMEN LIMITED RIGHT UPPER QUADRANT

[Series 1: us abdomen limited ruq (liver/gb) · 14 of 70 slices shown]
[im 1/70]
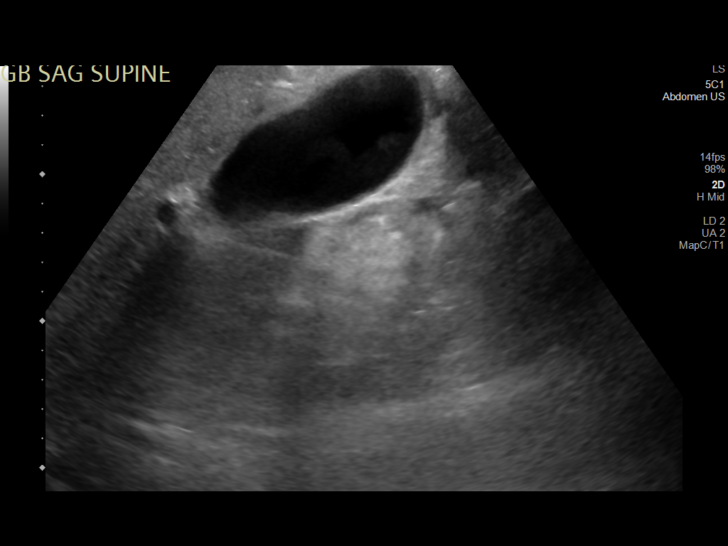
[im 6/70]
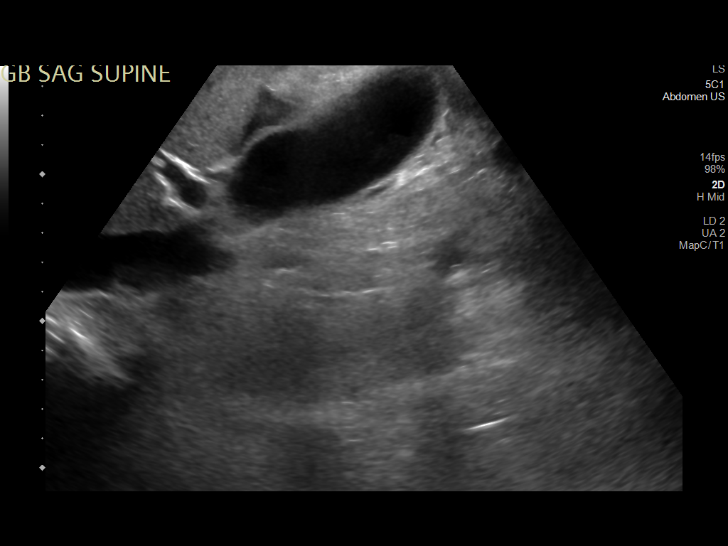
[im 12/70]
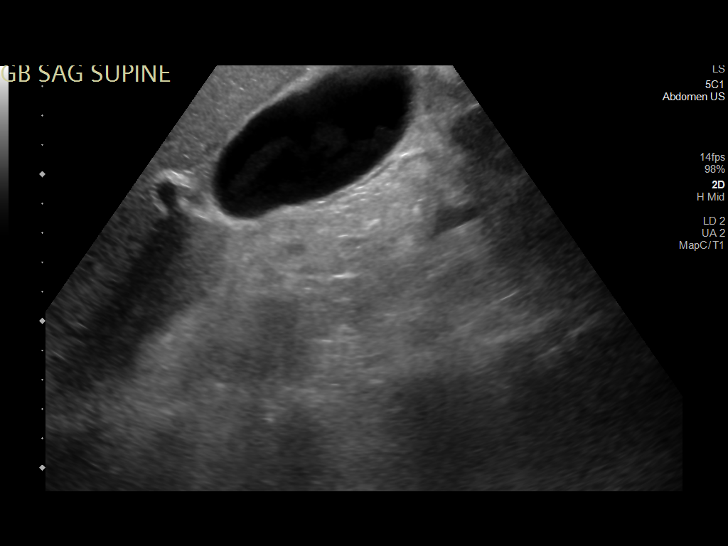
[im 18/70]
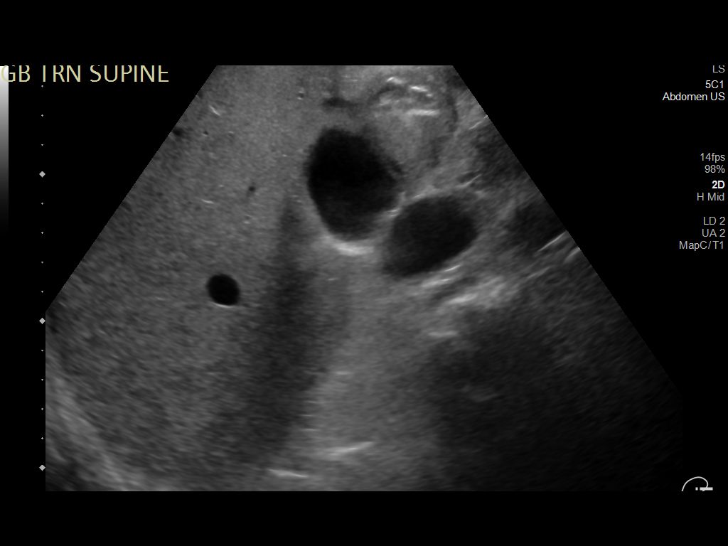
[im 24/70]
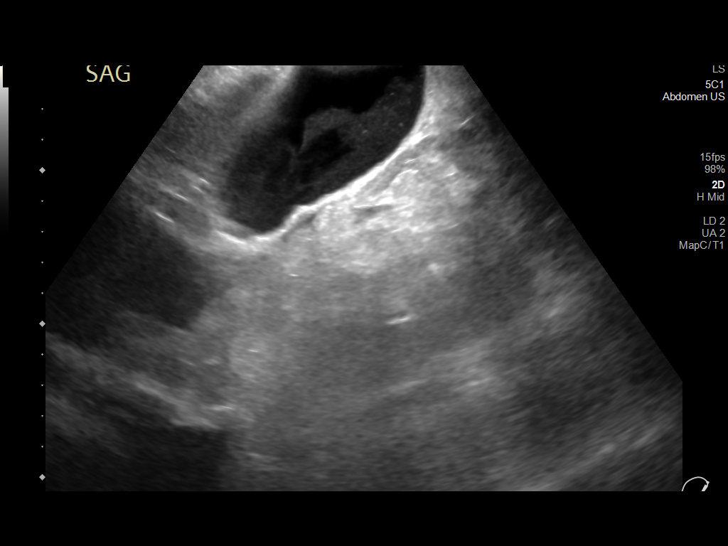
[im 26/70]
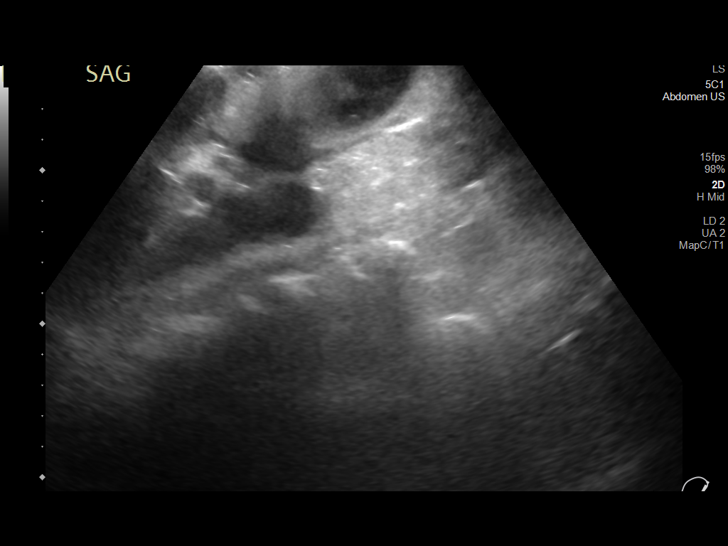
[im 32/70]
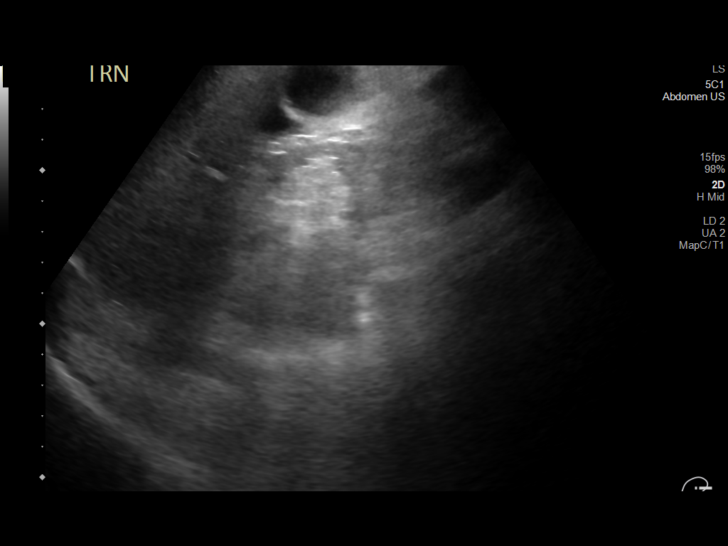
[im 38/70]
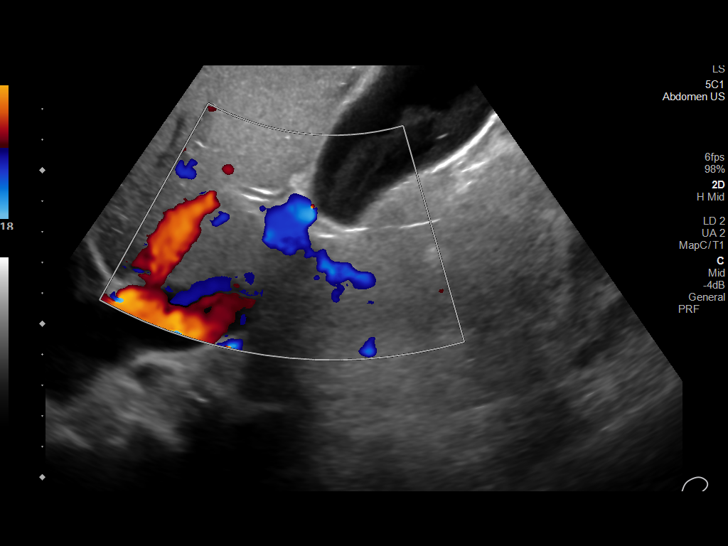
[im 44/70]
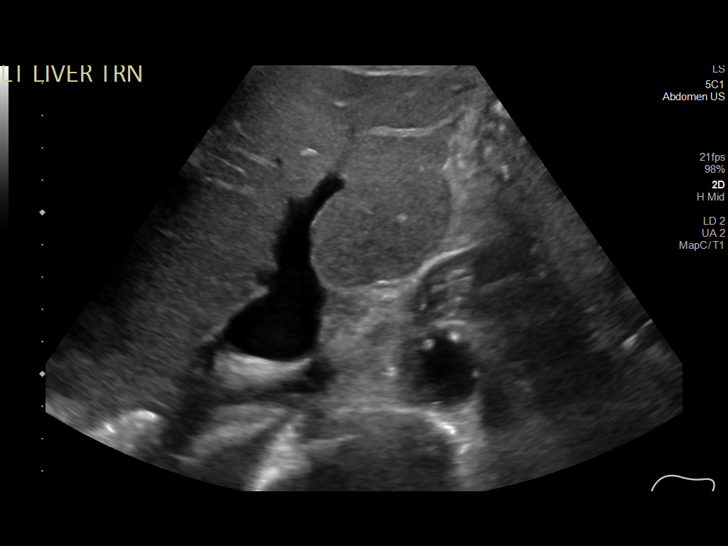
[im 47/70]
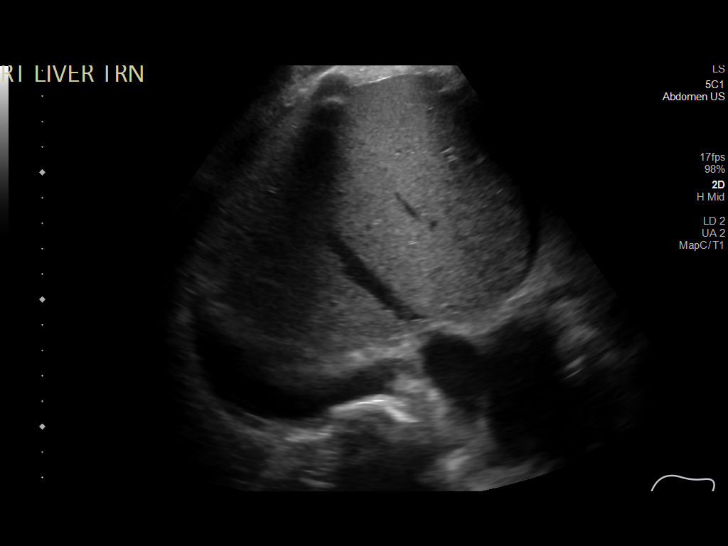
[im 52/70]
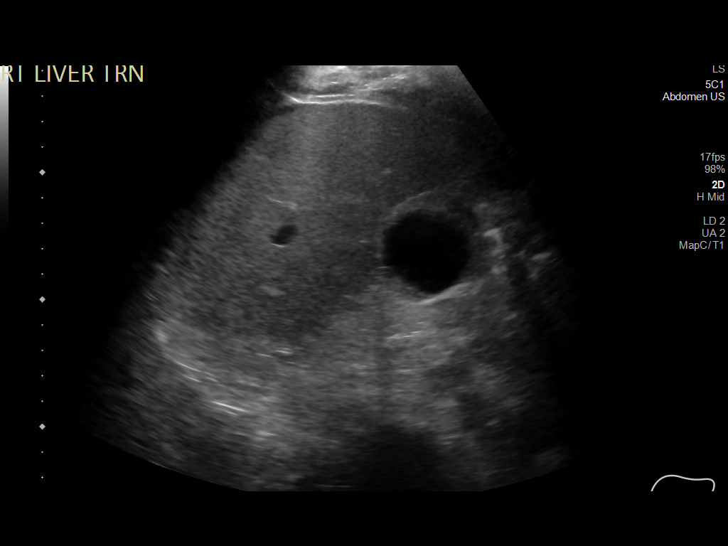
[im 58/70]
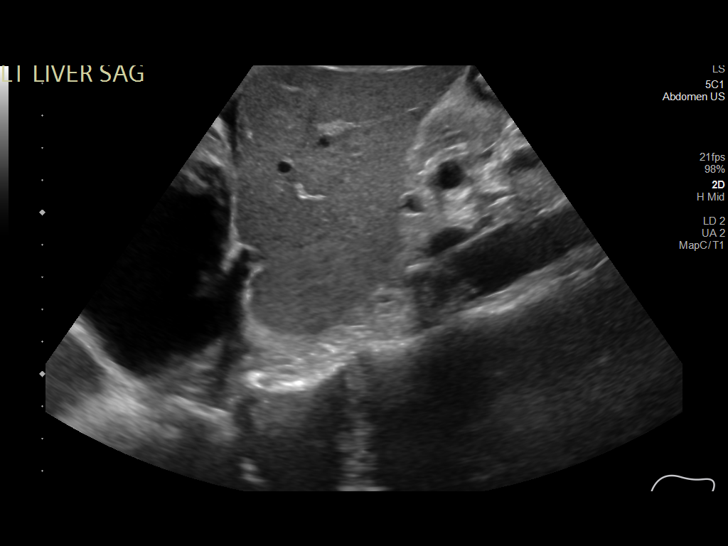
[im 64/70]
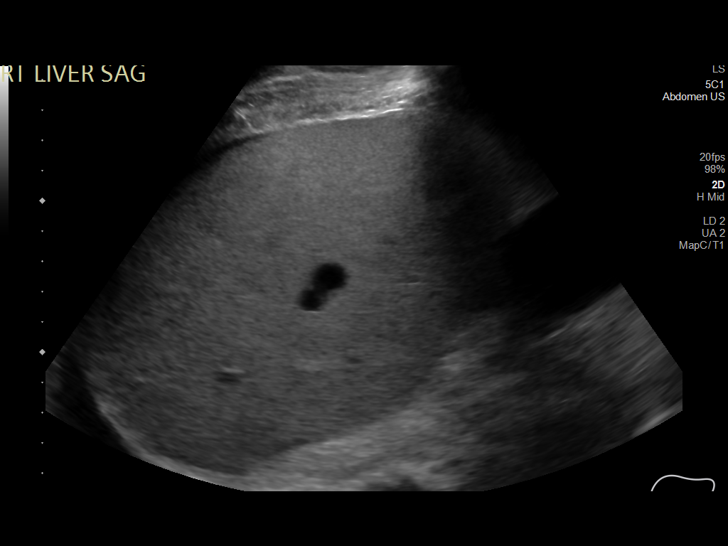
[im 70/70]
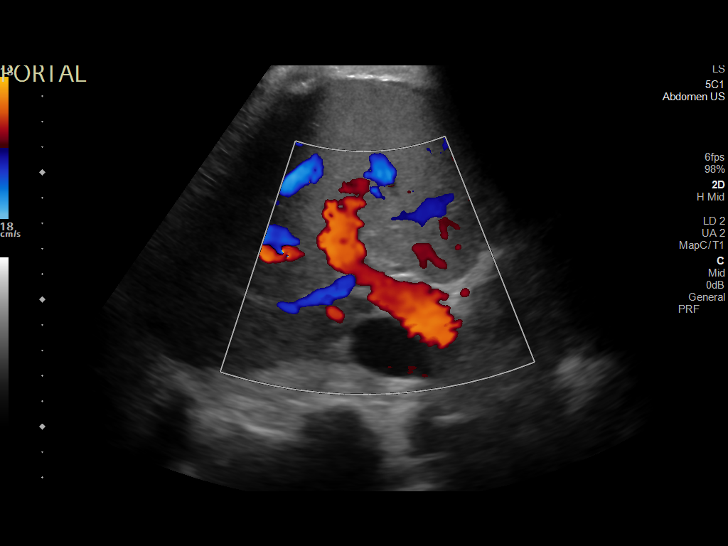

[14 of 25 positions shown; findings below may reference images not displayed]

FINDINGS: Gallbladder:

A moderate to marked amount of heterogeneous echogenic sludge is
noted within the gallbladder lumen. No gallstones are identified.
The gallbladder wall measures approximately 3.7 mm in thickness. No
sonographic Murphy sign noted by sonographer.

Common bile duct:

Diameter: 2.4 mm

Liver:

No focal lesion identified. Diffusely increased echogenicity of the
liver parenchyma is noted. Portal vein is patent on color Doppler
imaging with normal direction of blood flow towards the liver.

Other: A trace amount of right upper quadrant ascites is seen.

Of incidental note is the presence of a right-sided pleural
effusion.
IMPRESSION: 1. Gallbladder sludge without evidence of cholelithiasis.
2. Hepatic steatosis.
3. Trace amount of right upper quadrant ascites.
4. Right pleural effusion.

## 2022-03-12 IMAGING — DX DG CHEST 1V PORT
1 series · 1 of 1 positions shown · non-contrast
Comparison: Chest radiograph July 02, 2021

CLINICAL DATA: Altered mental status x2 days, questionable sepsis.

EXAM:
PORTABLE CHEST 1 VIEW

[chest ap]
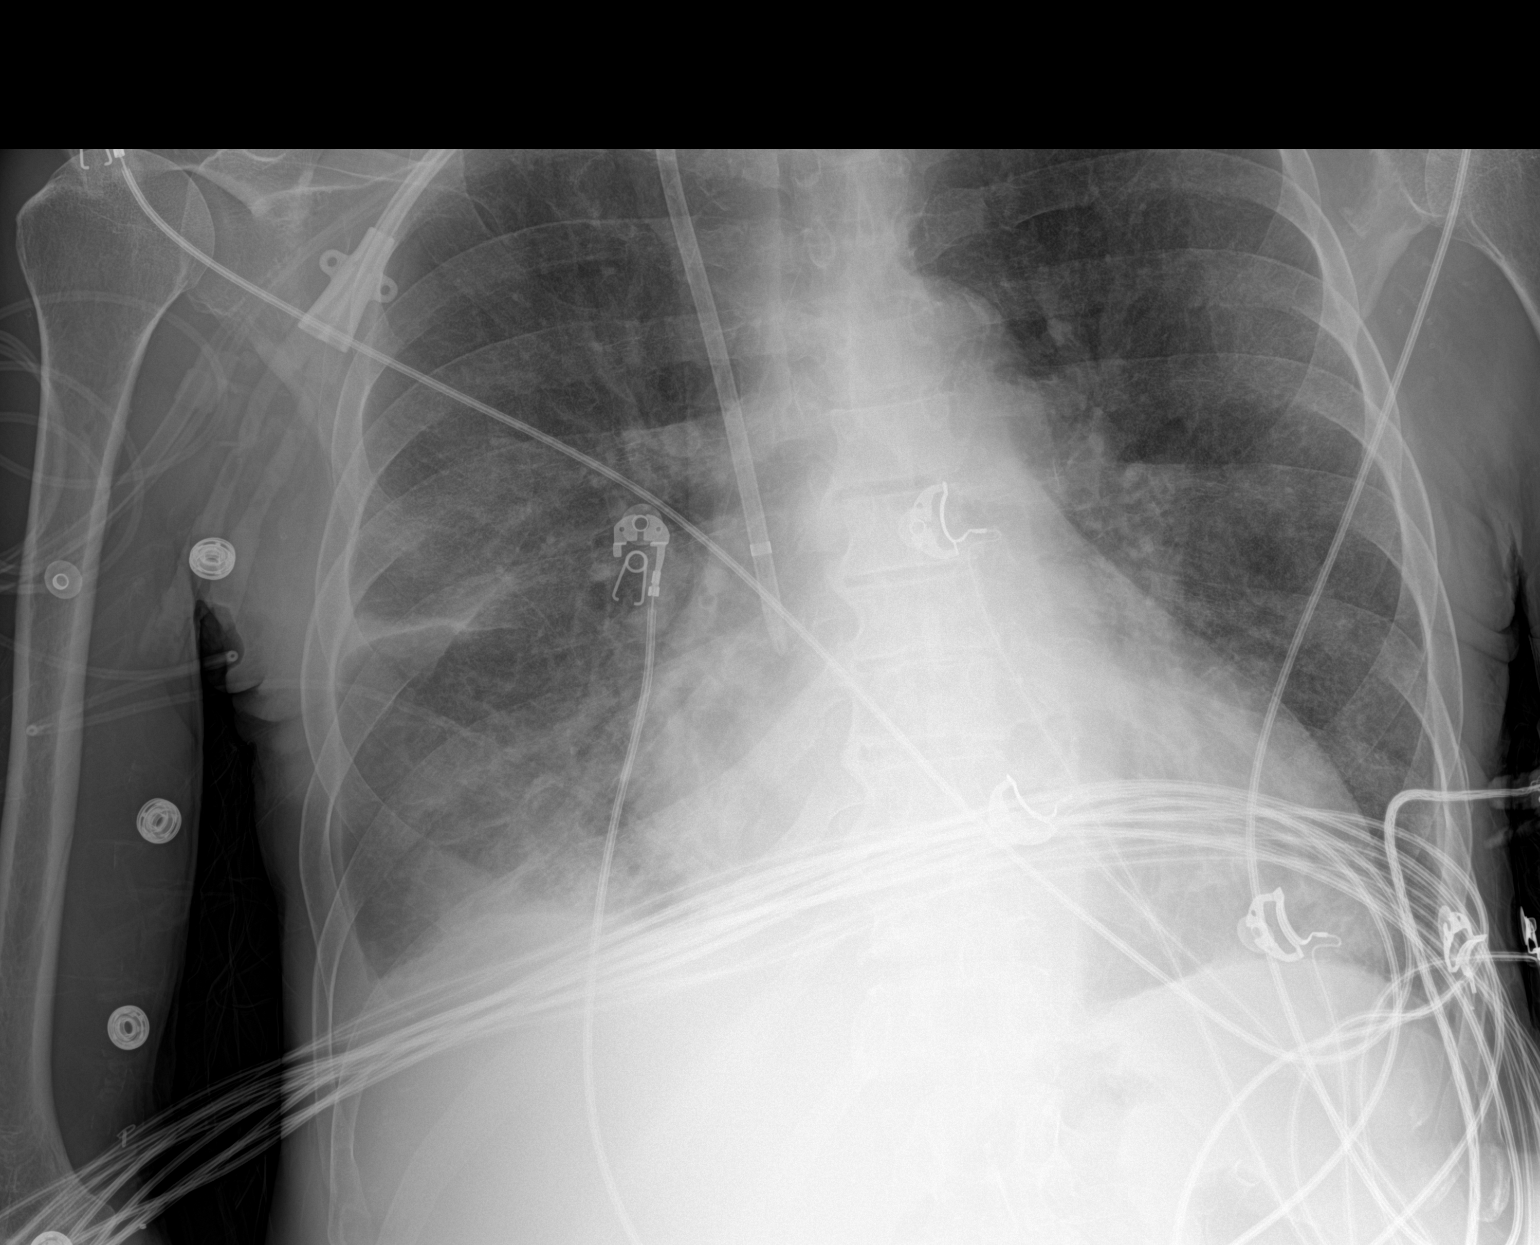

[1 of 1 positions shown; findings below may reference images not displayed]

FINDINGS: Dual lumen right chest wall dialysis catheter with tip overlying the
superior cavoatrial junction. The heart size and mediastinal
contours are stable. Similar size of the small right pleural
effusion. Interval increased aeration of the right lower lobe of a
decrease in the right basilar consolidation. No new focal
consolidation. The visualized skeletal structures are unchanged.
IMPRESSION: 1. Similar size of the small right pleural effusion with interval
improved aeration of the right lower lobe and decreased in the right
basilar consolidation. No new focal consolidation.

## 2022-03-13 IMAGING — NM NM PULMONARY PERF PARTICULATE
8 series · 8 of 8 positions shown · non-contrast
Comparison: Chest radiograph dated 07/07/2021. CTA chest dated
06/30/2021.

CLINICAL DATA: Chest pain, shortness of breath, history of blood
clots

EXAM:
NUCLEAR MEDICINE PERFUSION LUNG SCAN
TECHNIQUE: Perfusion images were obtained in multiple projections after
intravenous injection of radiopharmaceutical.
Ventilation scans intentionally deferred if perfusion scan and chest
x-ray adequate for interpretation during COVID 19 epidemic.
RADIOPHARMACEUTICALS:  3.91 mCi 4c-22m MAA IV

[Series 1: ant/post perf · 4.14mm/px · 1 of 1 slices shown (1 of 2)]
[im 1/1]
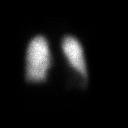

[Series 1: ant/post perf · 4.14mm/px · 1 of 1 slices shown (2 of 2)]
[im 1/1]
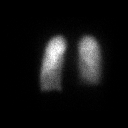

[Series 2: lao/rpo perf · 4.14mm/px · 1 of 1 slices shown (1 of 2)]
[im 1/1]
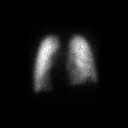

[Series 2: lao/rpo perf · 4.14mm/px · 1 of 1 slices shown (2 of 2)]
[im 1/1]
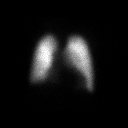

[Series 3: lpo/rao perf · 4.14mm/px · 1 of 1 slices shown (1 of 2)]
[im 1/1]
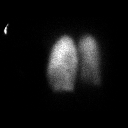

[Series 3: lpo/rao perf · 4.14mm/px · 1 of 1 slices shown (2 of 2)]
[im 1/1]
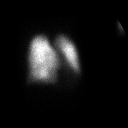

[Series 4: lt lat/rt lat perf · 4.14mm/px · 1 of 1 slices shown (1 of 2)]
[im 1/1]
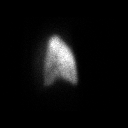

[Series 4: lt lat/rt lat perf · 4.14mm/px · 1 of 1 slices shown (2 of 2)]
[im 1/1]
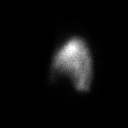

[8 of 8 positions shown; findings below may reference images not displayed]

FINDINGS: Normal perfusion, without wedge-shaped perfusion defects to suggest
pulmonary embolism.

Corresponding chest radiograph demonstrates patchy opacities in the
right mid and lower lungs.
IMPRESSION: Negative for pulmonary embolism.

## 2022-03-22 IMAGING — US IR FLUORO GUIDE CV LINE*R*
1 series · 1 of 1 positions shown · non-contrast
Comparison: none

INDICATION: ESRD requiring HD.

[Series 1: ir fluoro guide cv line*right* · 1 of 1 slices shown]
[im 1/1]
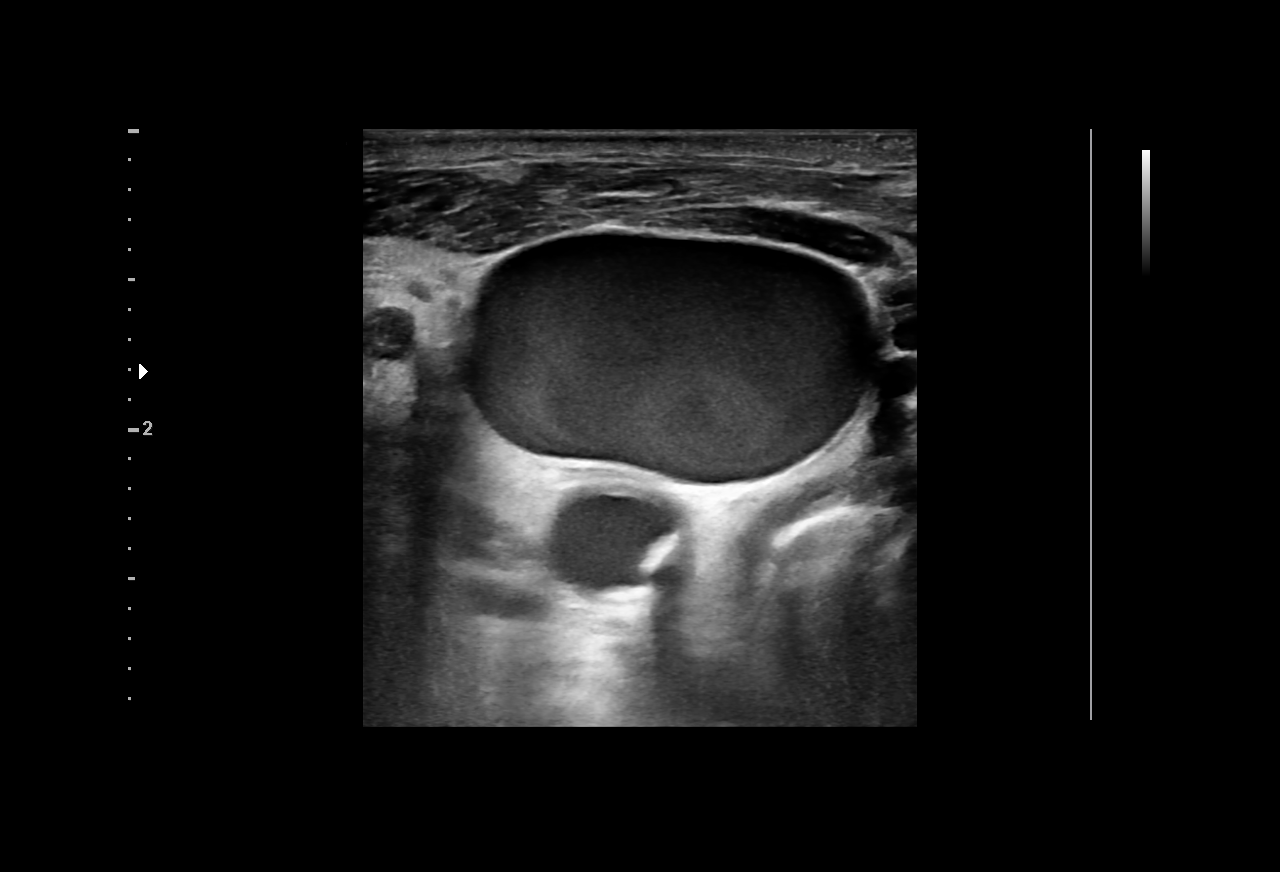

[1 of 1 positions shown; findings below may reference images not displayed]

EXAM:
TUNNELED CENTRAL VENOUS HEMODIALYSIS CATHETER PLACEMENT WITH
ULTRASOUND AND FLUOROSCOPIC GUIDANCE

MEDICATIONS:
Vancomycin 1 gm IV . The antibiotic was given in an appropriate time
interval prior to skin puncture.

ANESTHESIA/SEDATION:
Moderate (conscious) sedation was employed during this procedure. A
total of Versed 1 mg and Fentanyl 25 mcg was administered
intravenously.

Moderate Sedation Time: 15 minutes. The patient's level of
consciousness and vital signs were monitored continuously by
radiology nursing throughout the procedure under my direct
supervision.

FLUOROSCOPY TIME:  0 minutes 6 seconds (1 mGy).

COMPLICATIONS:
None immediate.



After creating a small venotomy incision, a micropuncture kit was
utilized to access the internal jugular vein. Real-time ultrasound
guidance was utilized for vascular access including the acquisition
of a permanent ultrasound image documenting patency of the accessed
vessel. The microwire was utilized to measure appropriate catheter
length.

A stiff Glidewire was advanced to the level of the IVC and the
micropuncture sheath was exchanged for a peel-away sheath. A
hemosplit tunneled hemodialysis catheter measuring 23 cm from tip to
cuff was tunneled in a retrograde fashion from the anterior chest
wall to the venotomy incision.

The catheter was then placed through the peel-away sheath with tips
ultimately positioned within the superior aspect of the right
atrium. Final catheter positioning was confirmed and documented with
a spot radiographic image. The catheter aspirates and flushes
normally. The catheter was flushed with appropriate volume heparin
dwells.

The catheter exit site was secured with a 0-silk retention suture.
The venotomy incision was closed with an interrupted 2-0 Vicryl,
then Dermabond was applied at the skin. Dressings were applied. The
patient tolerated the procedure well without immediate post
procedural complication.
IMPRESSION: Successful placement of 23 cm tunneled hemodialysis catheter via the
RIGHT internal jugular vein with tip terminating within the proximal
right atrium.

The catheter is ready for immediate use.

## 2022-03-24 IMAGING — DX DG CHEST 1V PORT
1 series · 1 of 1 positions shown · non-contrast
Comparison: 07/19/2021

CLINICAL DATA: Central line placement

EXAM:
PORTABLE CHEST 1 VIEW

[chest]
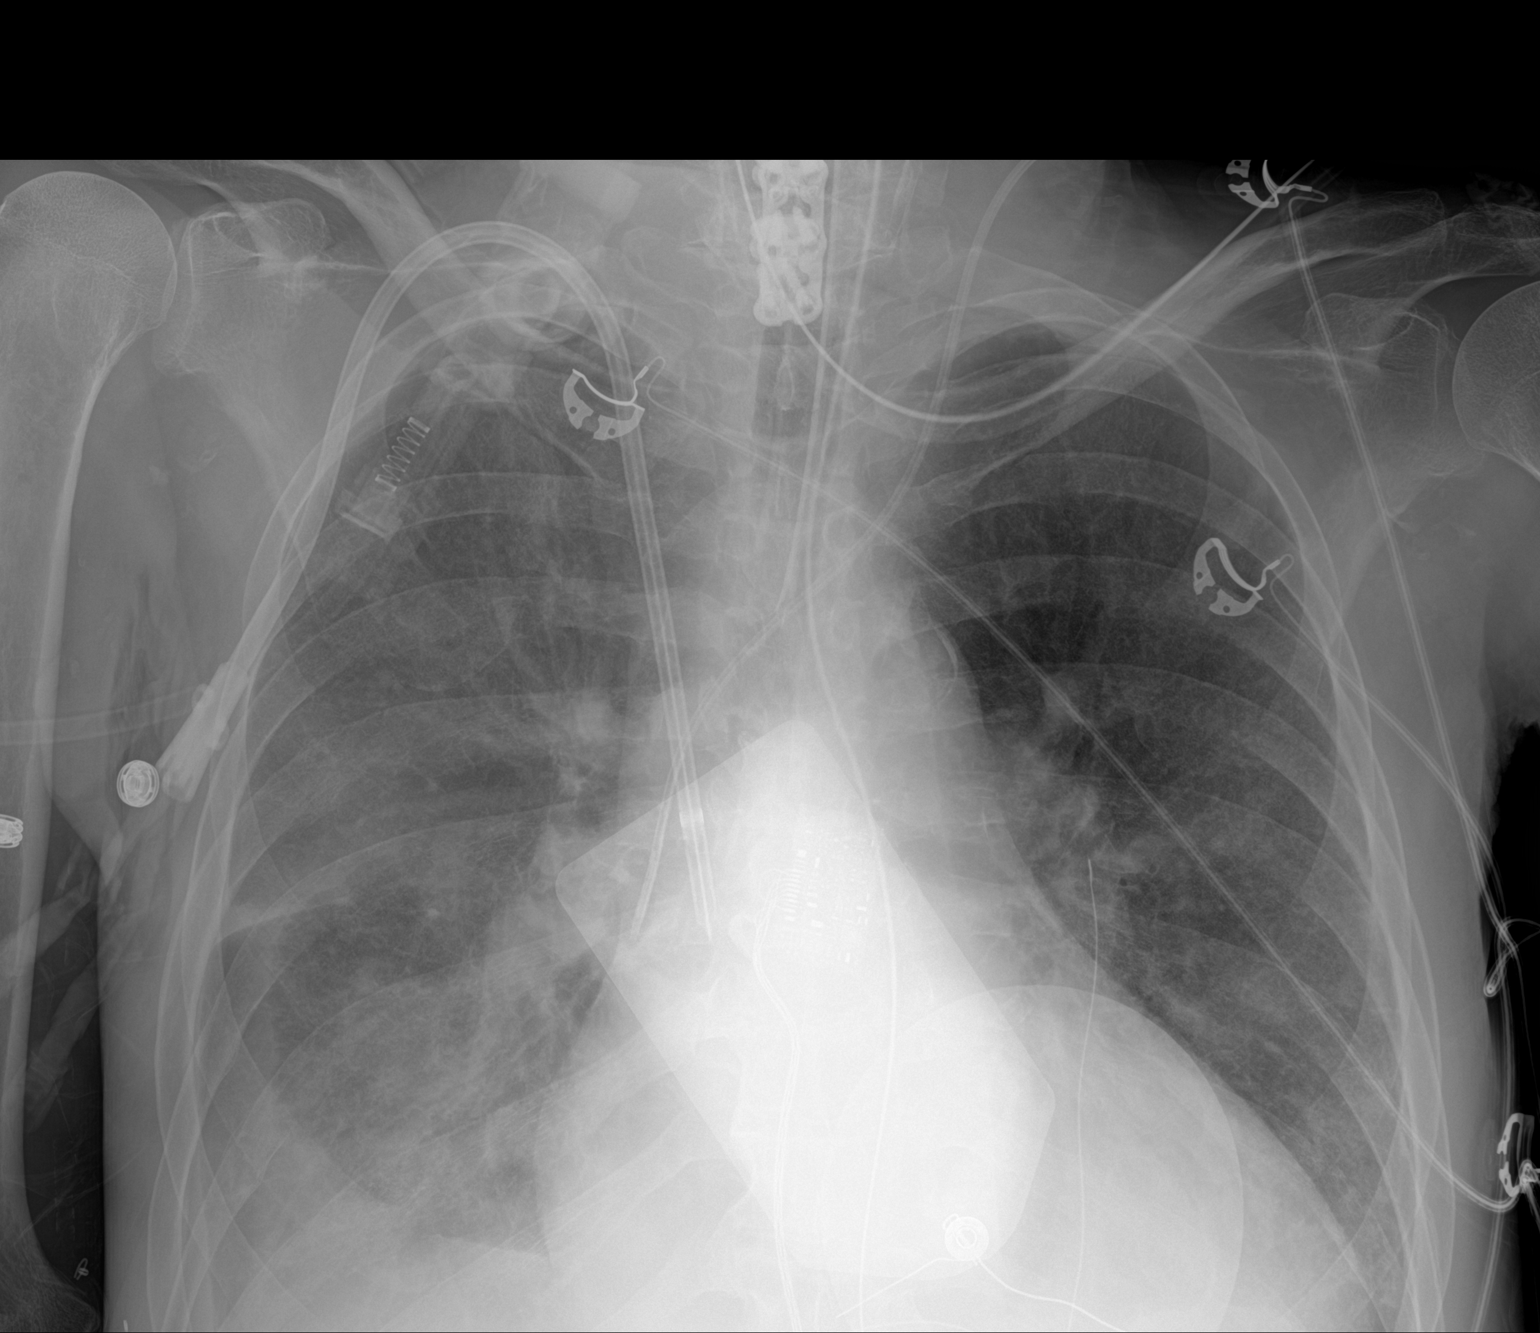

[1 of 1 positions shown; findings below may reference images not displayed]

FINDINGS: New left IJ central line tip overlies central SVC. Endotracheal
tube, enteric tube, and right dialysis catheter again identified. No
pneumothorax.

Likely layering pleural effusion on the right. Patchy increased
density bilaterally similar to prior. Stable cardiomediastinal
contours.
IMPRESSION: New left IJ central line tip overlies SVC.  No pneumothorax.

Persistent patchy bilateral opacities and layering right pleural
effusion.

## 2022-03-24 IMAGING — DX DG CHEST 1V PORT
1 series · 1 of 1 positions shown · non-contrast
Comparison: Limb 1622

CLINICAL DATA: Endotracheal tube placement.

EXAM:
PORTABLE CHEST 1 VIEW

[chest ap]
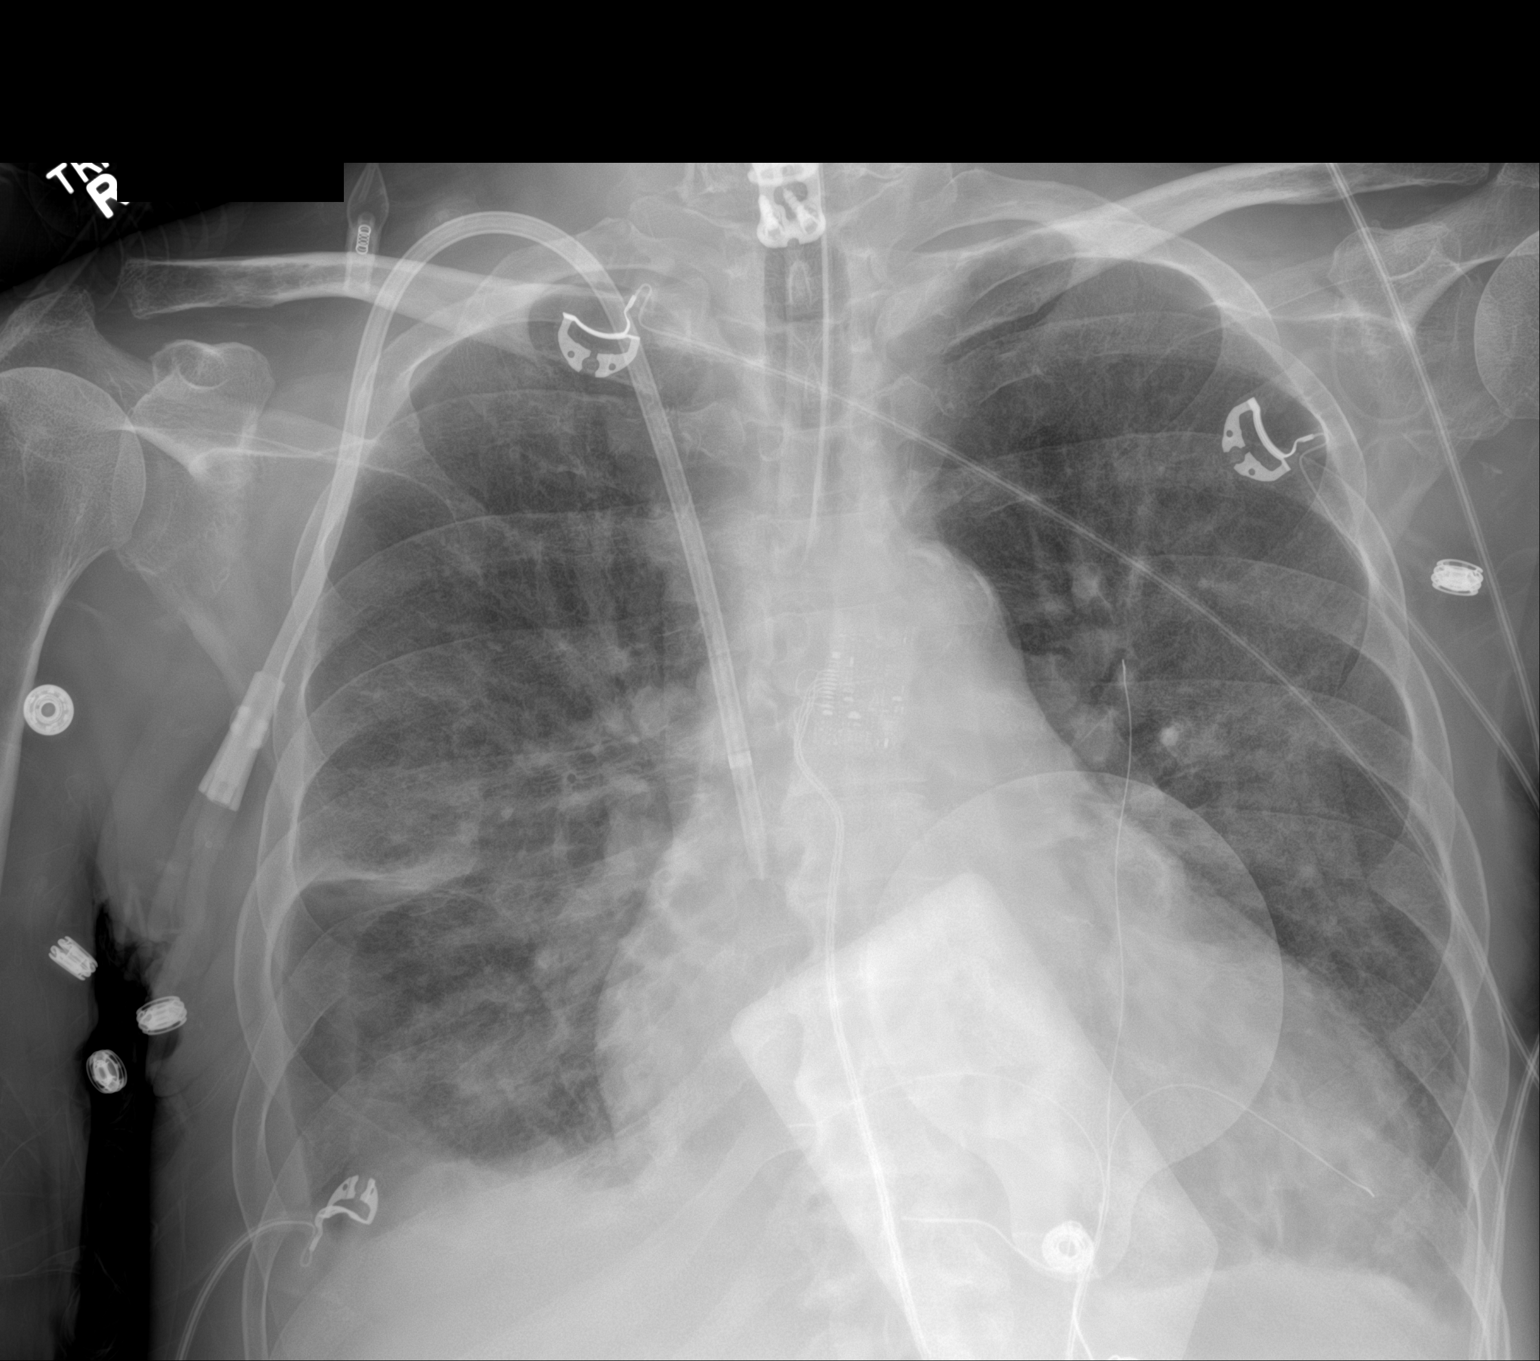

[1 of 1 positions shown; findings below may reference images not displayed]

FINDINGS: Endotracheal tube tip is 3.6 cm above the base of the carina. Right
IJ dialysis catheter tip overlies the SVC/RA junction. No
pneumothorax. Tiny bilateral pleural effusions again noted with
basilar atelectasis or infiltrate. Diffuse underlying interstitial
opacity again noted. The cardio pericardial silhouette is enlarged.
Telemetry leads overlie the chest.
IMPRESSION: 1. Endotracheal tube tip 3.6 cm above the base of the carina.
2. Tiny bilateral pleural effusions with basilar atelectasis or
infiltrate, similar to the prior. No evidence for pneumothorax.

## 2022-03-25 IMAGING — DX DG ABDOMEN 1V
1 series · 1 of 1 positions shown · non-contrast
Comparison: 07/01/2021, 07/19/2021

CLINICAL DATA: Enteric catheter placement

EXAM:
ABDOMEN - 1 VIEW

[abdomen supine]
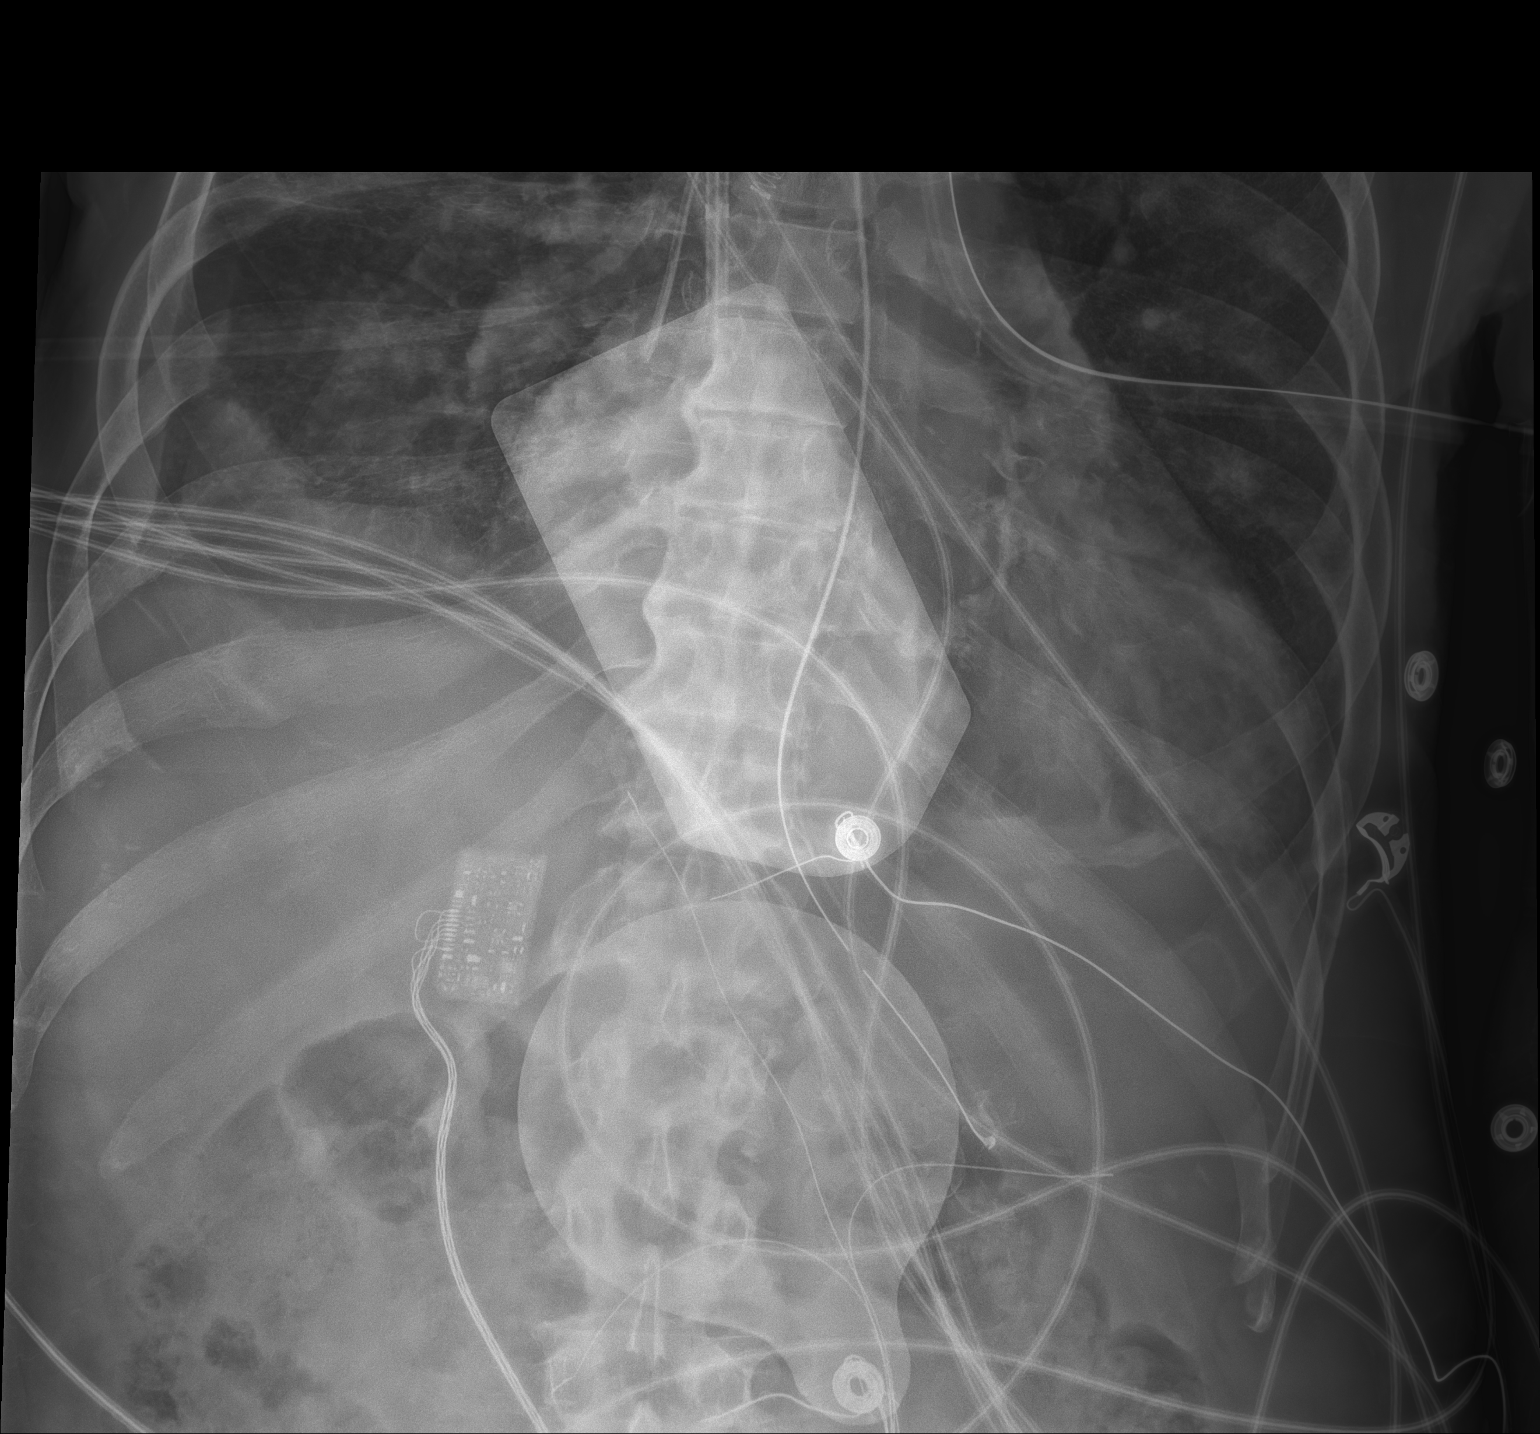

[1 of 1 positions shown; findings below may reference images not displayed]

FINDINGS: Frontal view of the lower chest and upper abdomen demonstrates
enteric catheter tip and side port projecting over the gastric
fundus. External defibrillator pad, left internal jugular catheter,
and right internal jugular dialysis catheter are stable. Visualized
bowel gas pattern is unremarkable. Bibasilar consolidation and
effusions are stable.
IMPRESSION: 1. Enteric catheter tip projecting over the gastric fundus.
2. Persistent bibasilar consolidation and effusions.

## 2022-04-16 IMAGING — DX DG CHEST 1V PORT
1 series · 1 of 1 positions shown · non-contrast
Comparison: Prior radiograph from 07/19/2021.

CLINICAL DATA: Initial evaluation for acute chest pain, shortness
of breath, weakness. End-stage renal disease on hemodialysis.

EXAM:
PORTABLE CHEST 1 VIEW

[chest ap]
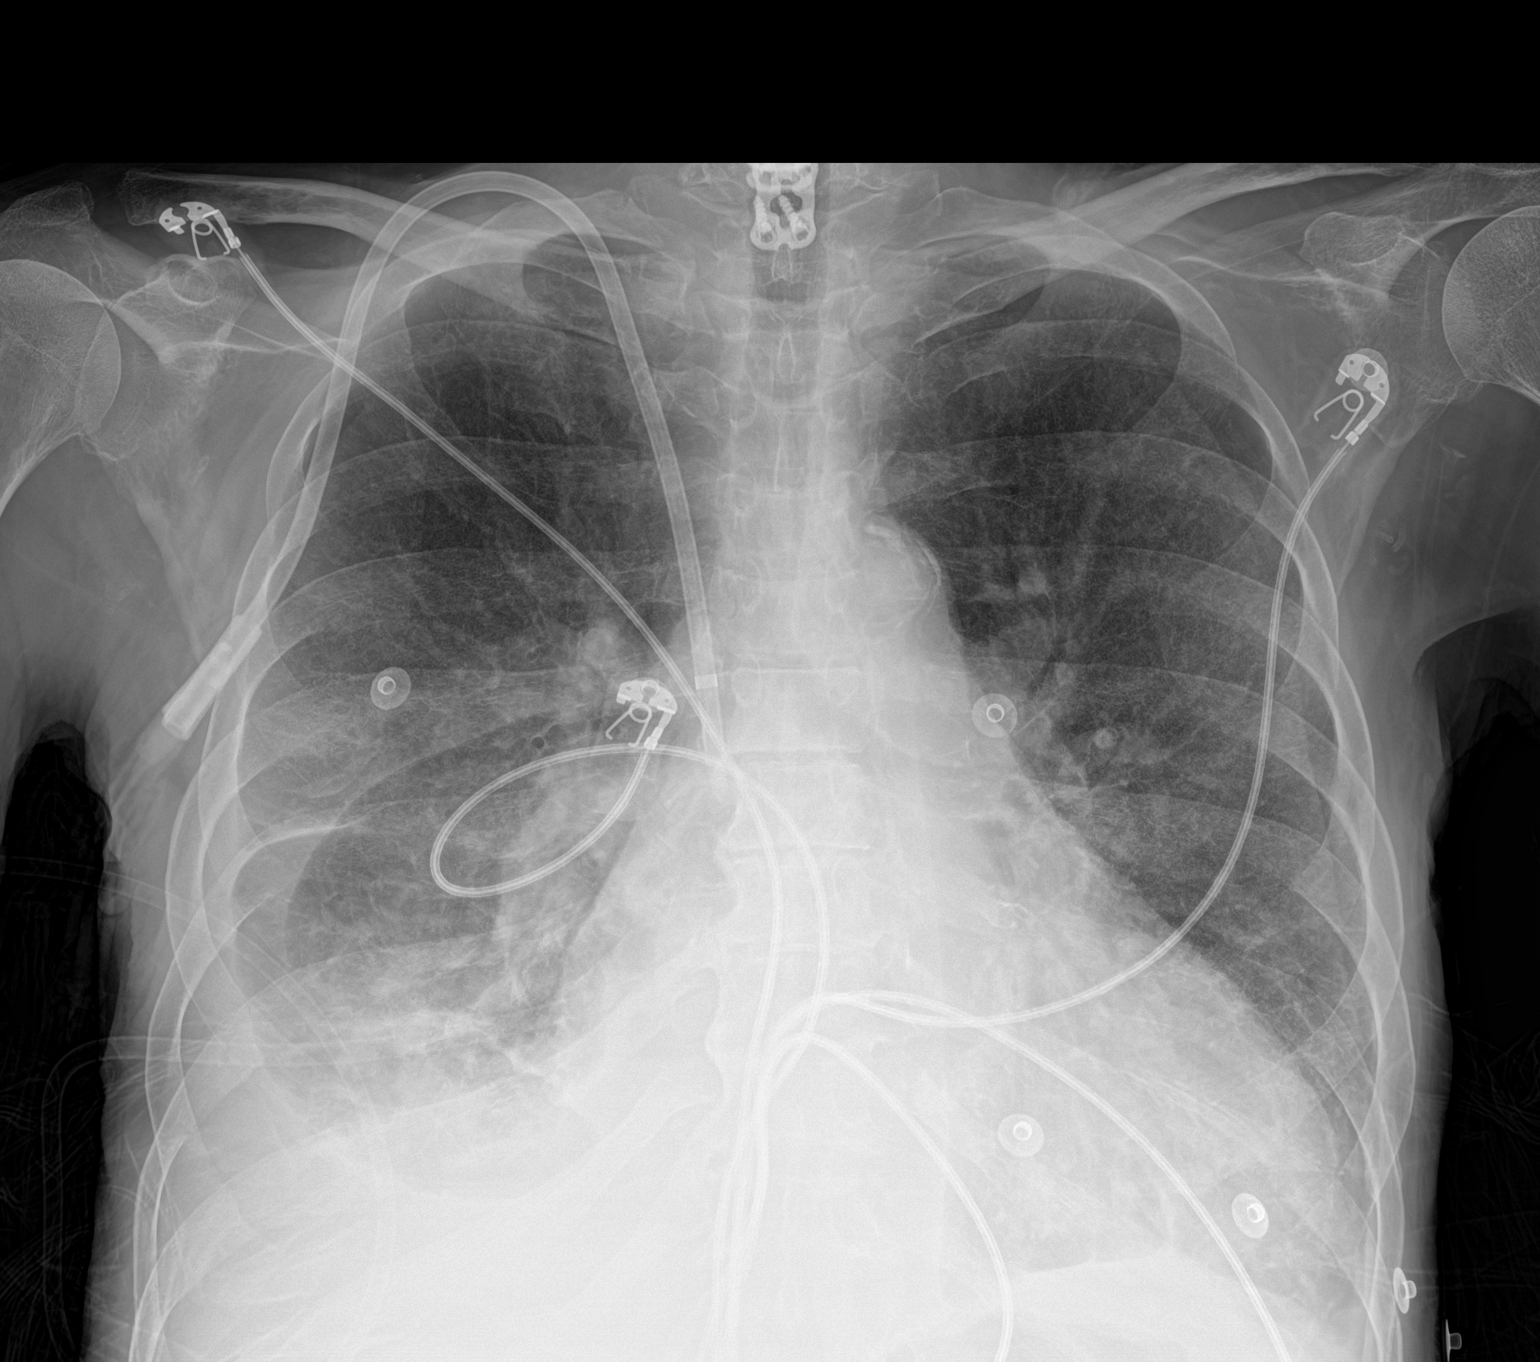

[1 of 1 positions shown; findings below may reference images not displayed]

FINDINGS: Right-sided hemodialysis catheter in place with tip overlying the
cavoatrial junction. Moderate cardiomegaly, stable. Mediastinal
silhouette within normal limits. Aortic atherosclerosis.

Lungs normally inflated. Diffuse vascular and interstitial
prominence compatible with moderate diffuse pulmonary interstitial
edema. Right greater than left pleural effusions. Associated right
basilar opacity likely reflects atelectasis and/or edema. No
pneumothorax.

No acute osseous finding.  Cervical ACDF noted.
IMPRESSION: 1. Cardiomegaly with moderate diffuse pulmonary interstitial edema
and right greater than left pleural effusions, suggesting CHF.
2. Associated right basilar opacity, likely atelectasis and/or
edema.

## 2022-04-30 IMAGING — DX DG CHEST 1V
2 series · 2 of 2 positions shown · non-contrast
Comparison: Portable exam 0089 hours compared to 08/11/2021

CLINICAL DATA: Chest pain since last night, end-stage renal disease
on hemodialysis, nonproductive cough, CHF, COPD, hypertension,
asthma

EXAM:
CHEST  1 VIEW

[chest ap (1 of 2)]
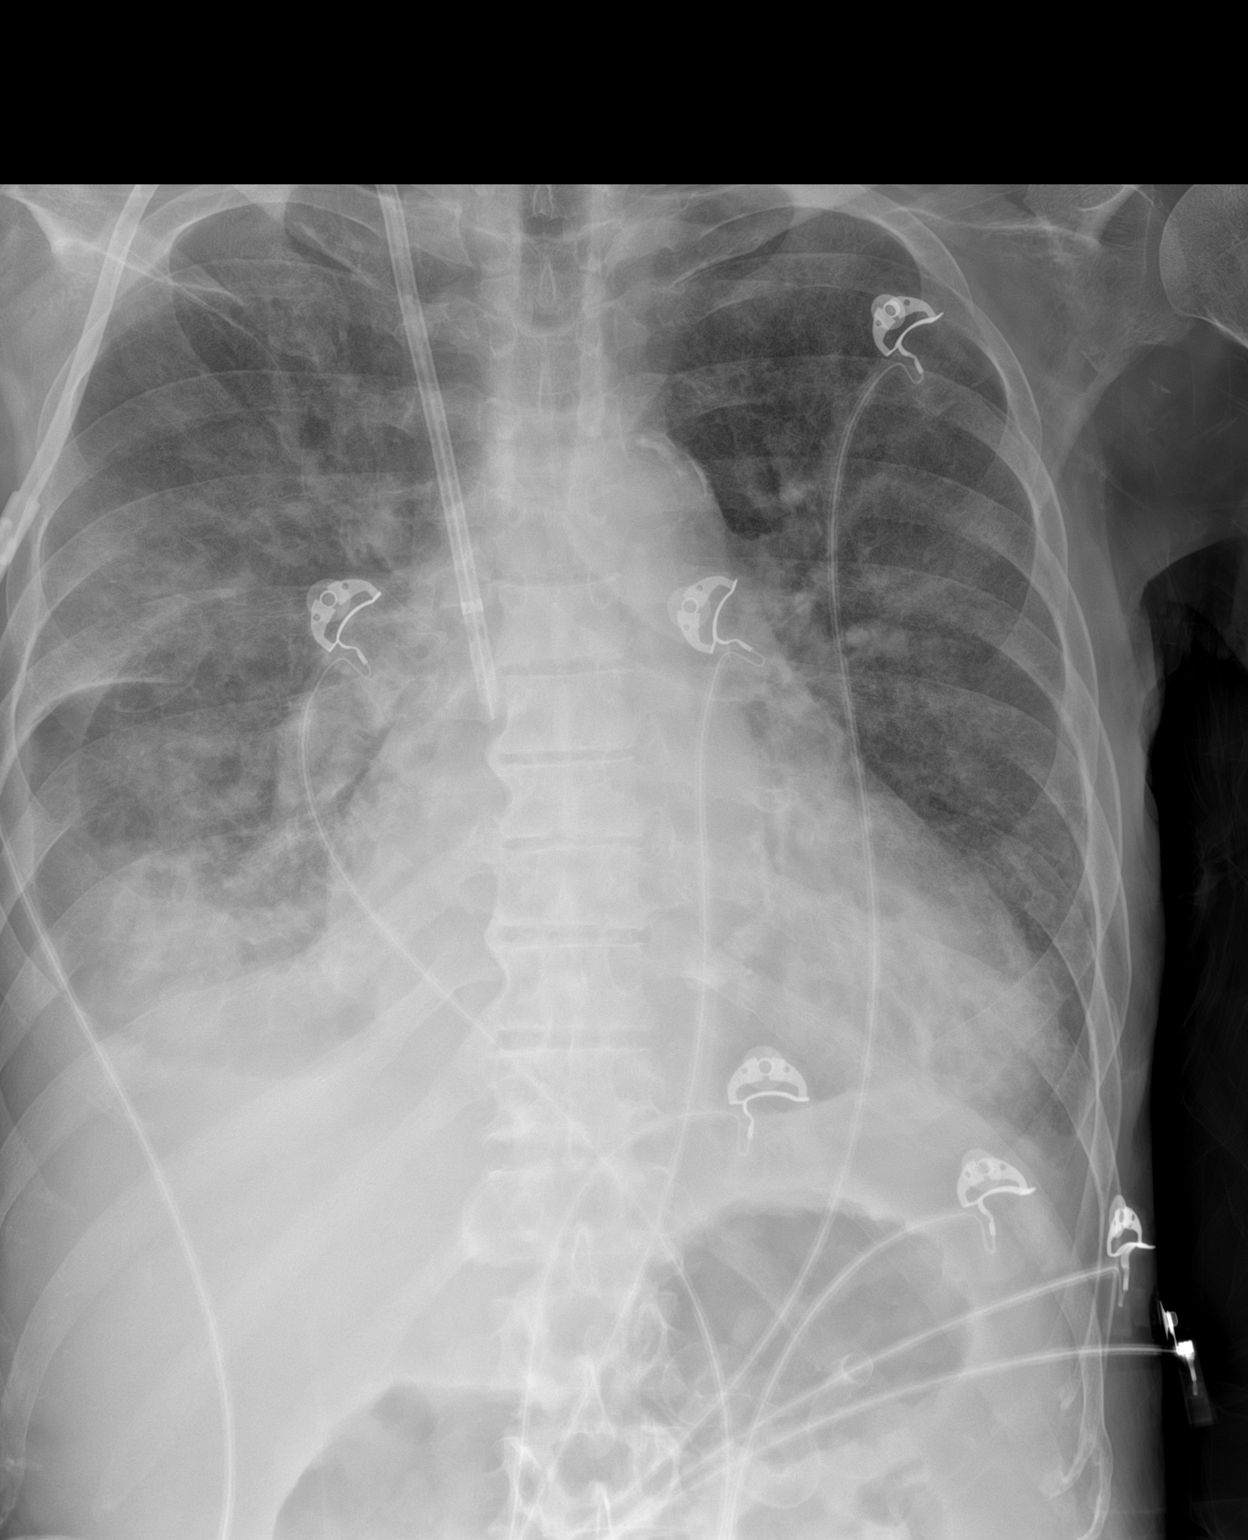

[chest ap (2 of 2)]
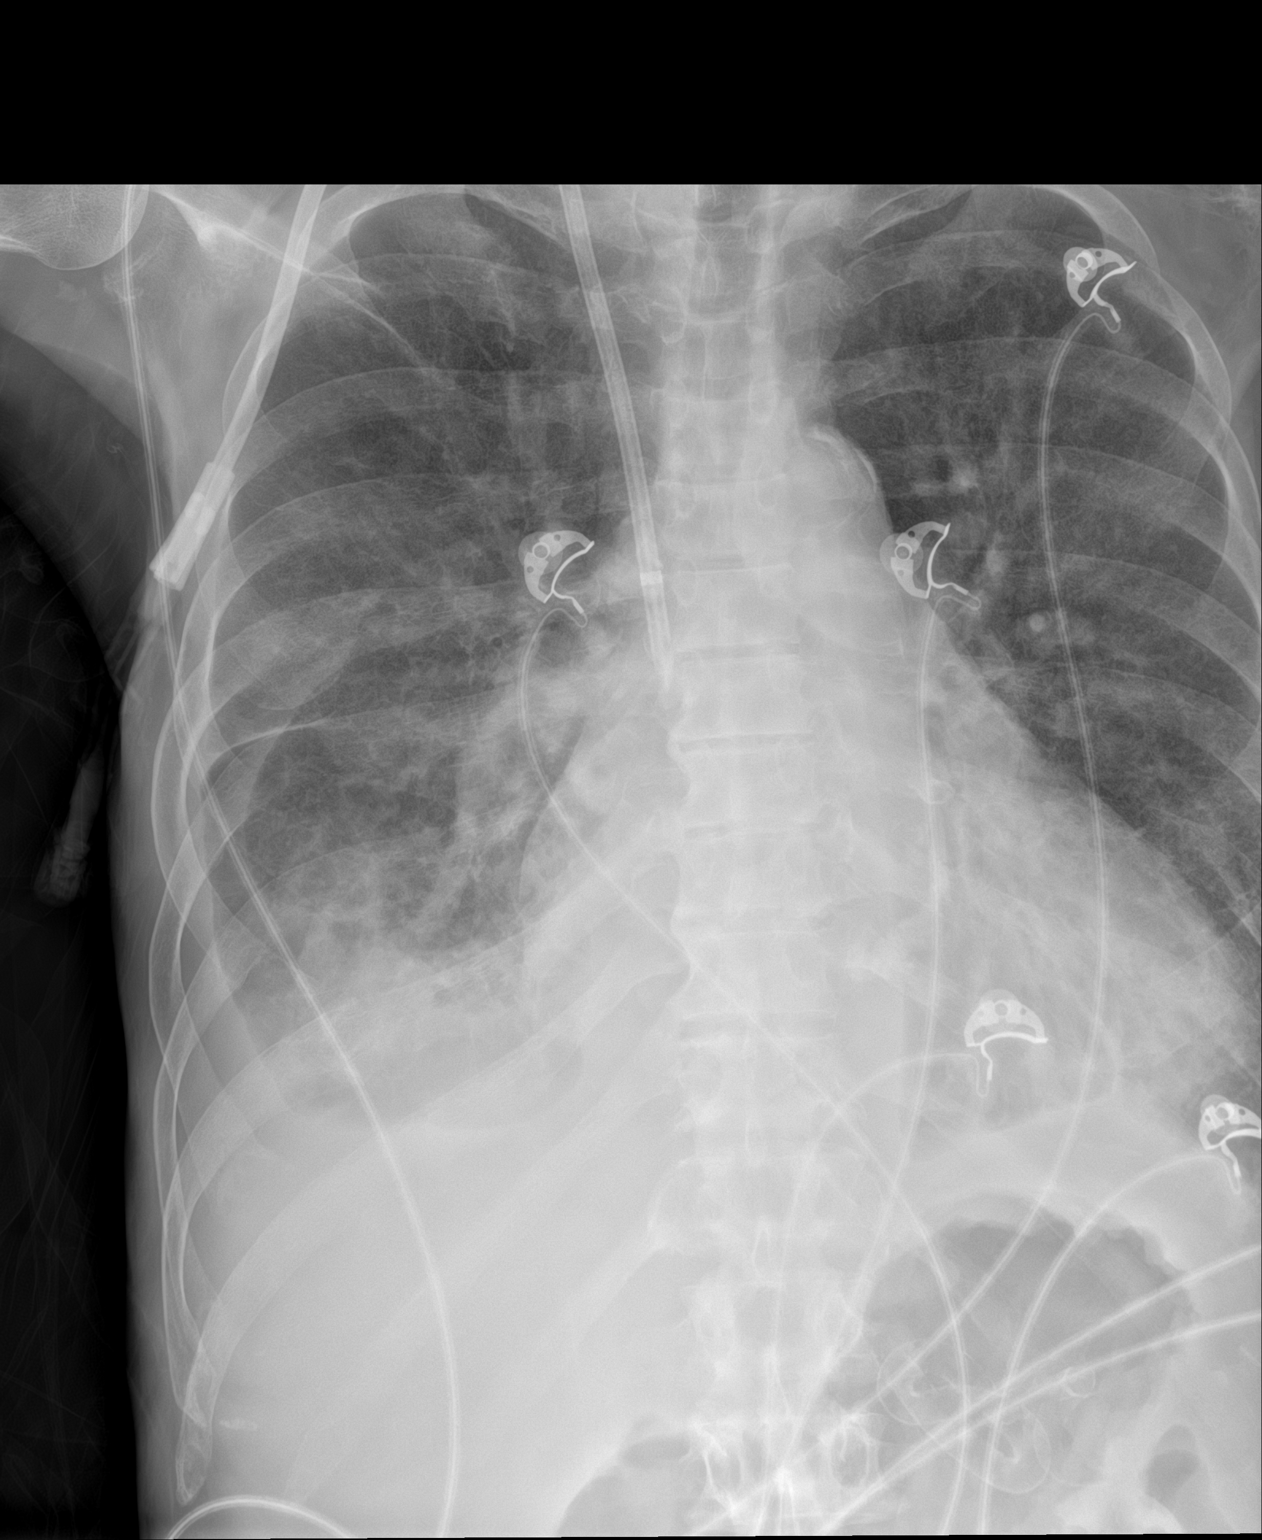

[2 of 2 positions shown; findings below may reference images not displayed]

FINDINGS: RIGHT jugular dual lumen central venous catheter with tip projecting
over SVC.

Enlargement of cardiac silhouette with pulmonary vascular
congestion.

Diffuse pulmonary infiltrates increased from previous exam
consistent with pulmonary edema.

Persistent RIGHT pleural effusion and basilar atelectasis.

No pneumothorax.

Atherosclerotic calcification aortic arch.

Prior cervical spine fusion.
IMPRESSION: Pulmonary edema consistent with CHF versus fluid overload, increased
from previous exam.

Persistent RIGHT pleural effusion and basilar atelectasis.

Aortic Atherosclerosis ([id number].G).

## 2022-05-03 IMAGING — DX DG CHEST 2V
2 series · 2 of 2 positions shown · non-contrast
Comparison: 08/25/2021

CLINICAL DATA: SOB

EXAM:
CHEST - 2 VIEW

[w chest lat]
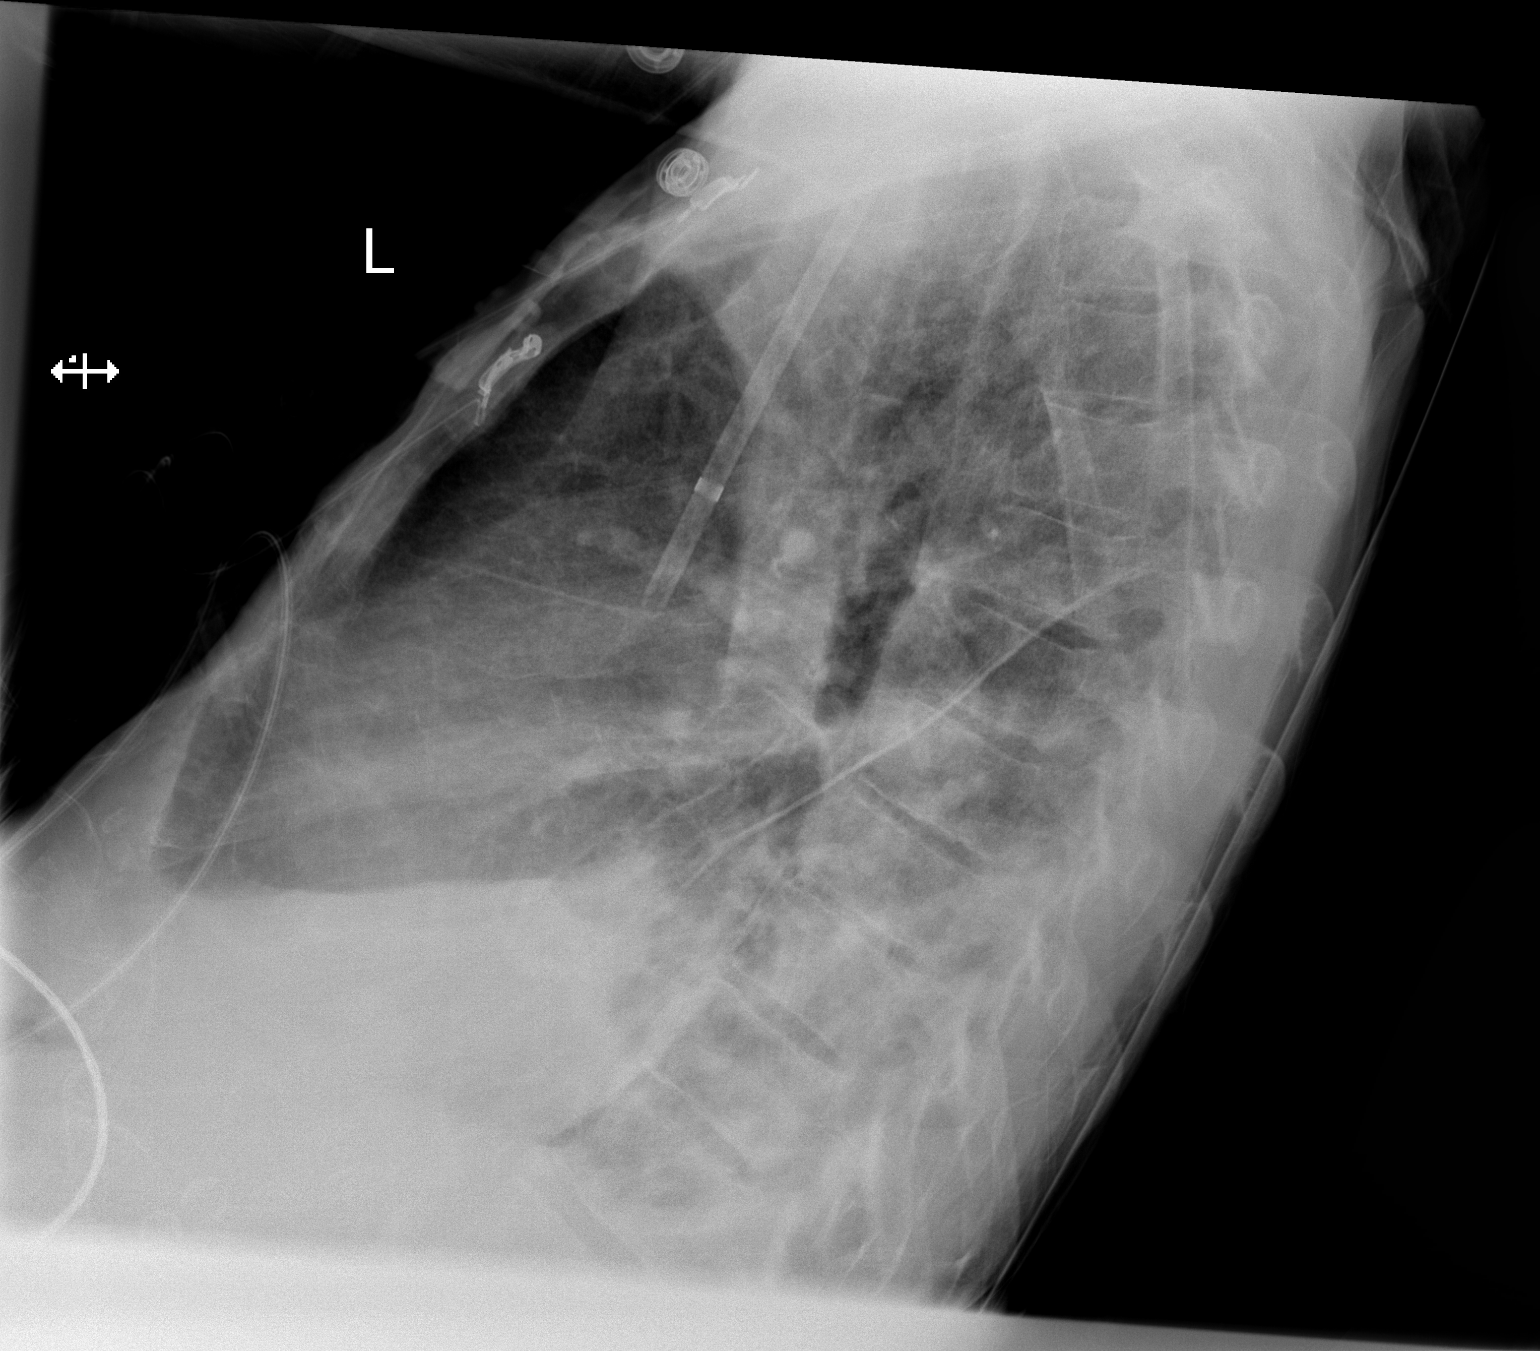

[x chest ap]
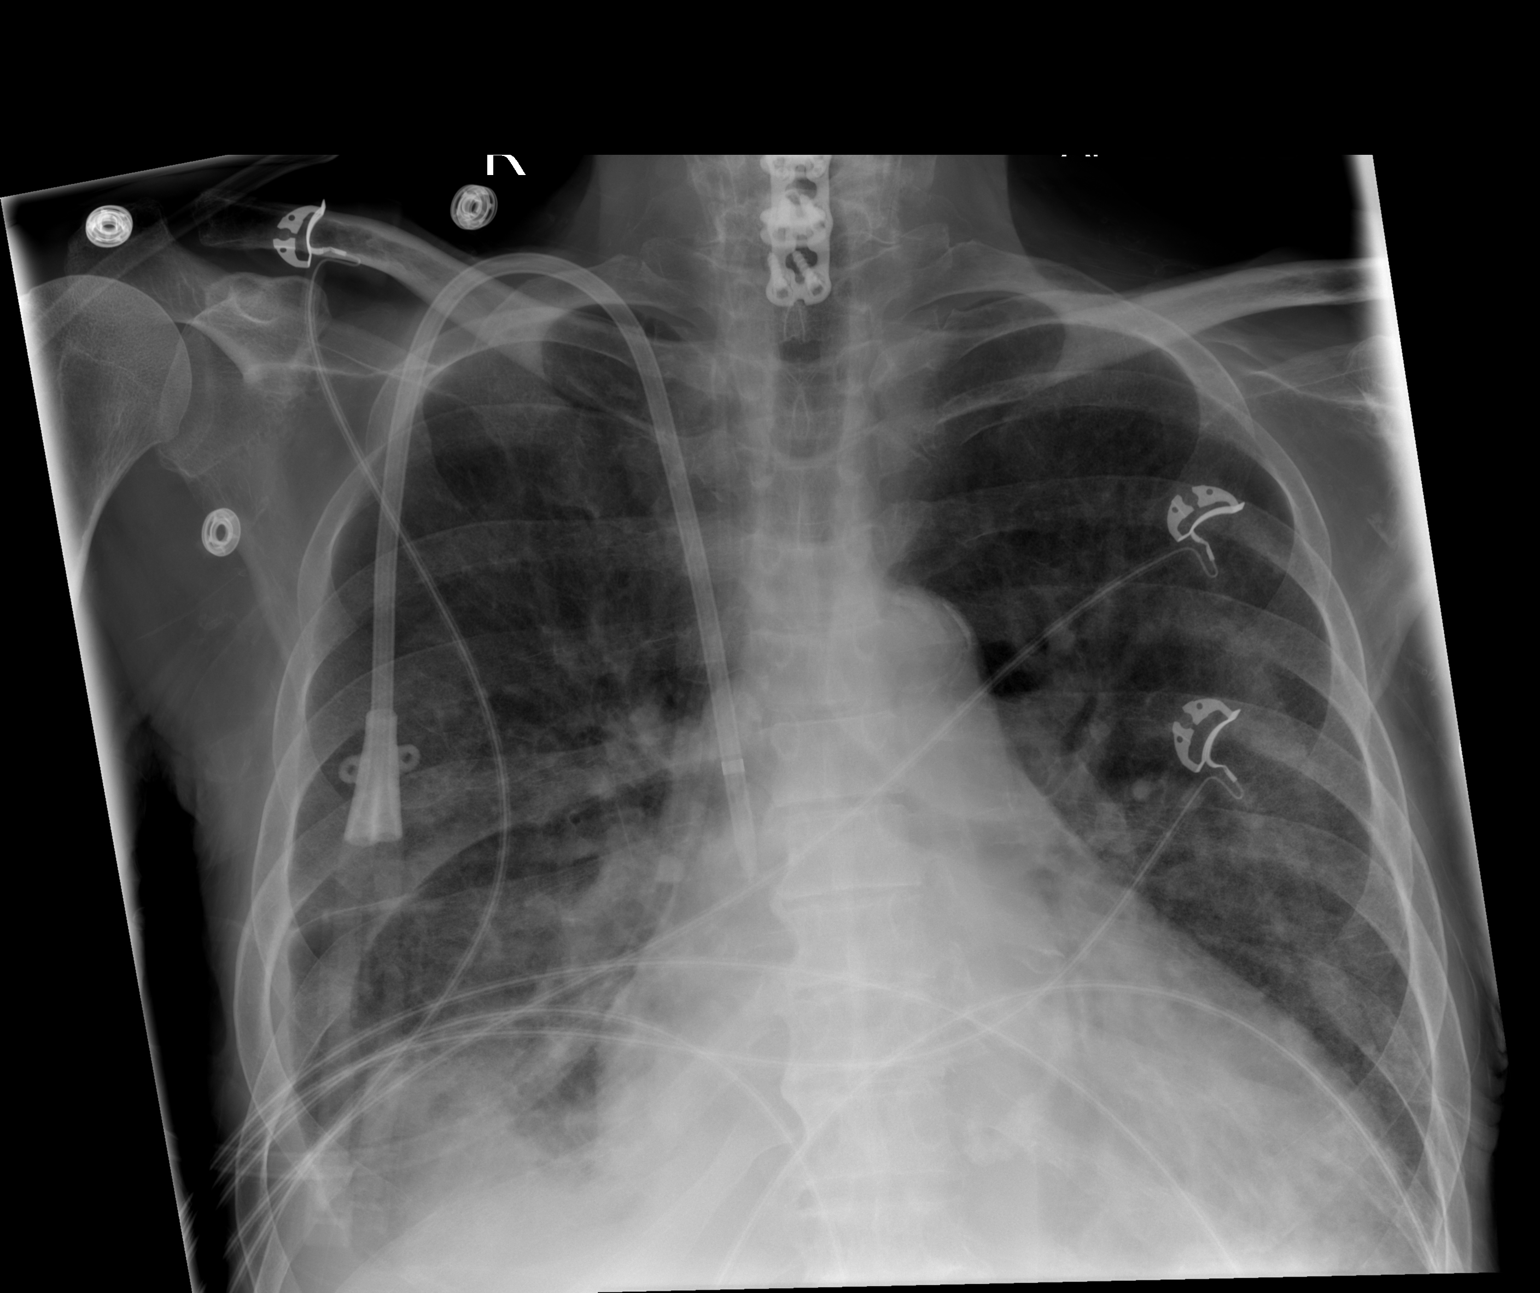

[2 of 2 positions shown; findings below may reference images not displayed]

FINDINGS: Right chest wall dialysis catheter is again noted with tip
projecting over the distal SVC. Stable cardiomediastinal contours.
Small right pleural effusion is unchanged. Atelectasis and or
airspace disease within the right lower lobe is unchanged. Diffuse
hazy lung opacities are again seen compatible with pulmonary edema.
IMPRESSION: Persistent right pleural effusion with overlying
atelectasis/airspace disease.

Unchanged mild diffuse edema.

## 2022-05-13 IMAGING — DX DG CHEST 1V PORT
1 series · 1 of 1 positions shown · non-contrast
Comparison: Most recent radiograph 08/28/2021. Chest CTA 08/26/2021

CLINICAL DATA: Weakness and shortness of breath.

EXAM:
PORTABLE CHEST 1 VIEW

[chest ap]
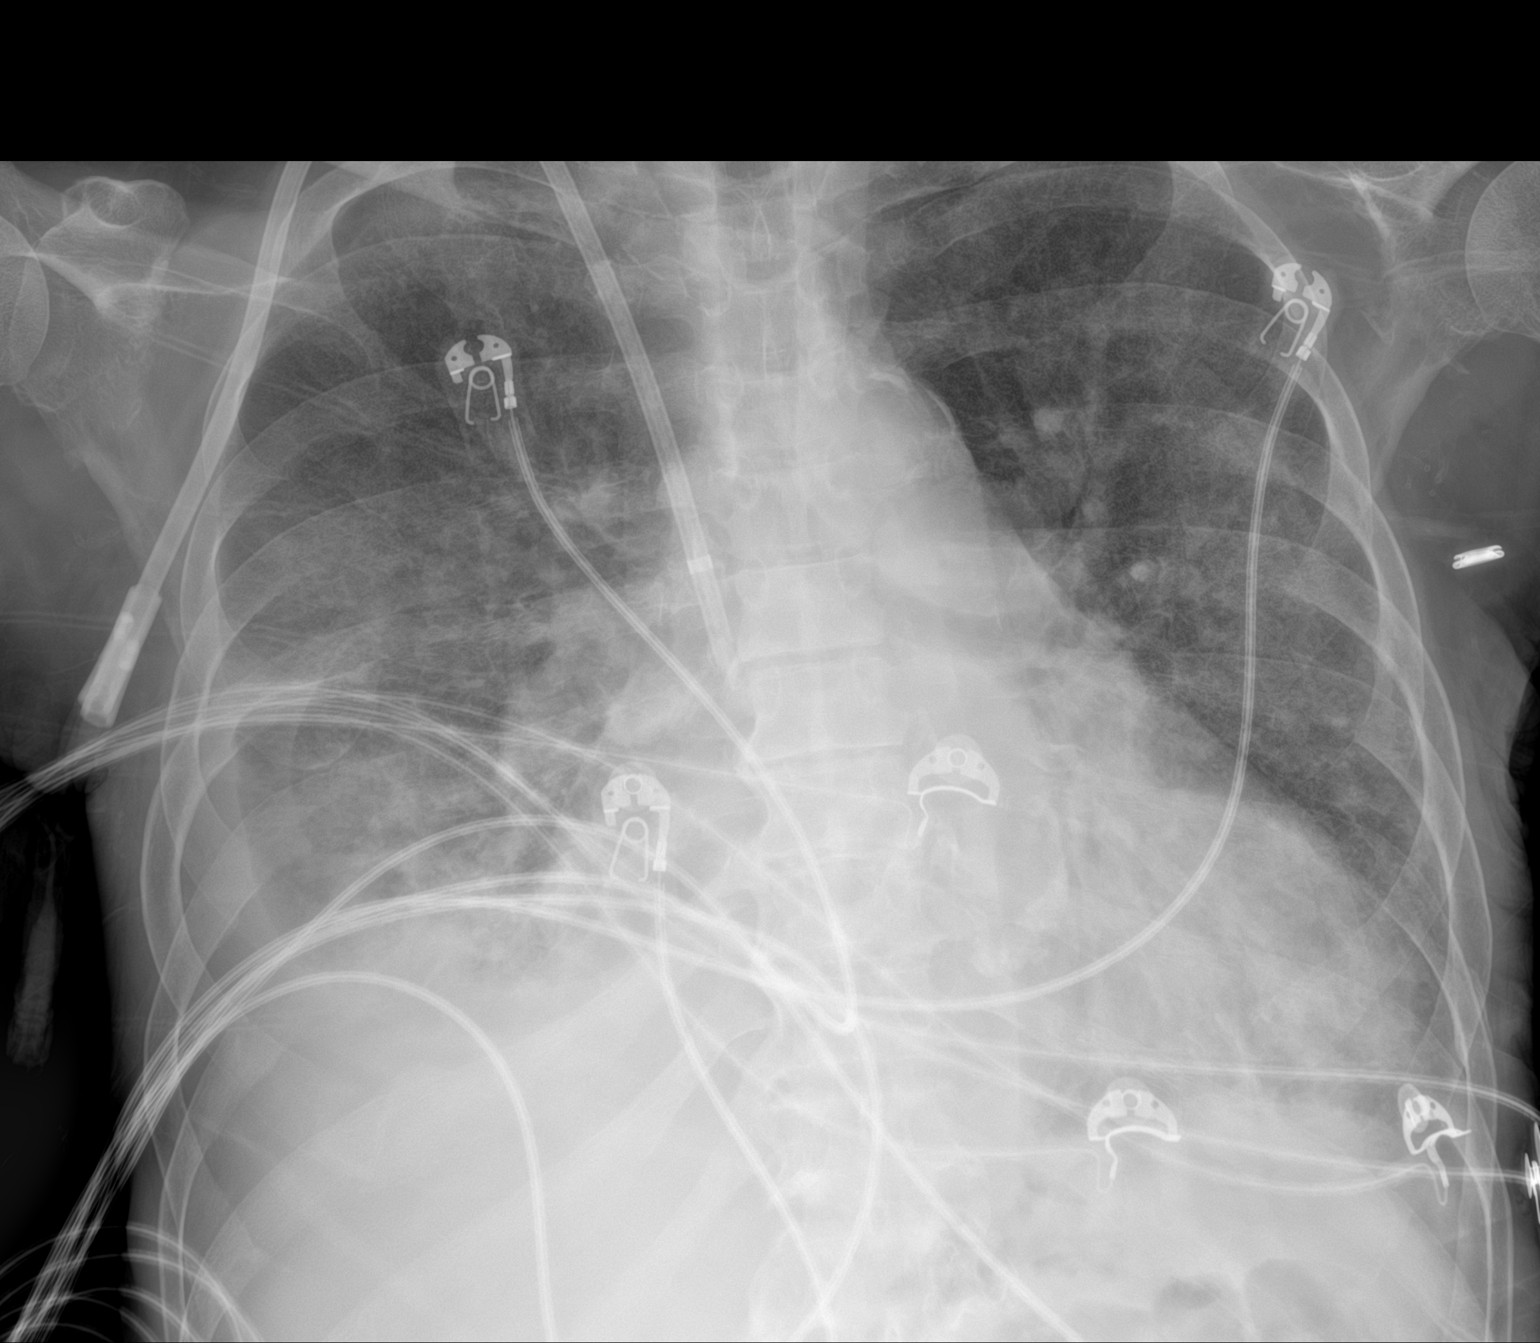

[1 of 1 positions shown; findings below may reference images not displayed]

FINDINGS: Right-sided dialysis catheter unchanged in position. Cardiomegaly is
stable. Increasing right pleural effusion and basilar atelectasis.
Increasing pulmonary edema. No pneumothorax. Stable osseous
structures.
IMPRESSION: 1. Increasing right pleural effusion and basilar atelectasis.
2. Increasing pulmonary edema.

## 2022-05-17 IMAGING — US IR THORACENTESIS ASP PLEURAL SPACE W/IMG GUIDE
1 series · 3 of 3 positions shown · non-contrast
Comparison: none

INDICATION: Shortness of breath, bilateral right greater than left pleural
effusions seen on previous chest x-ray. Request for therapeutic and
diagnostic thoracentesis.

[Series 1: ir (person_name)/(person_name) · 3 of 3 slices shown]
[im 1/3]
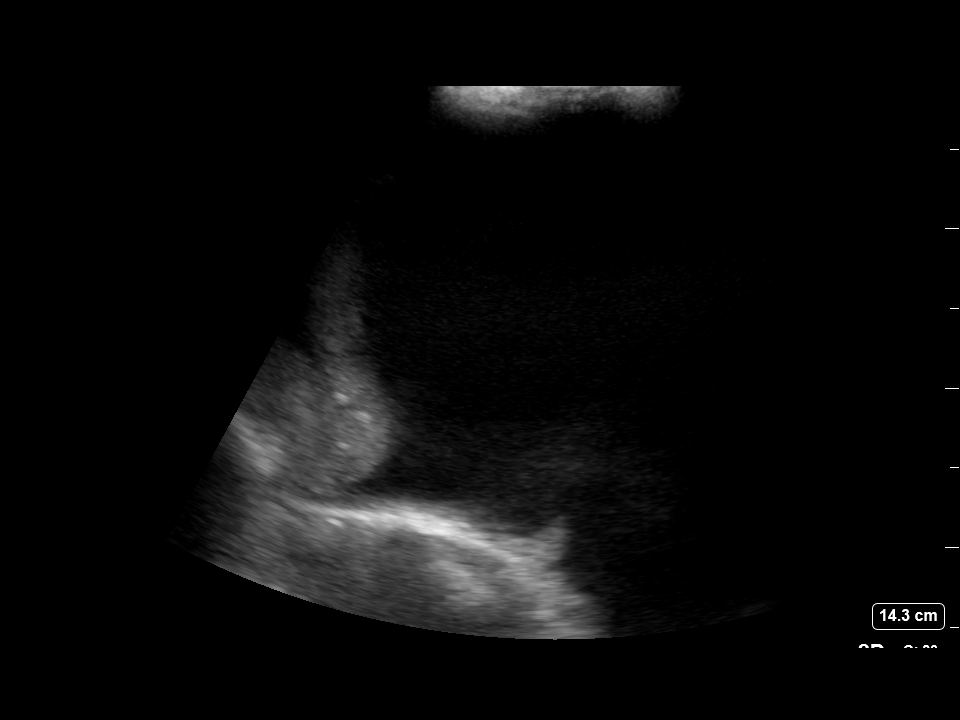
[im 2/3]
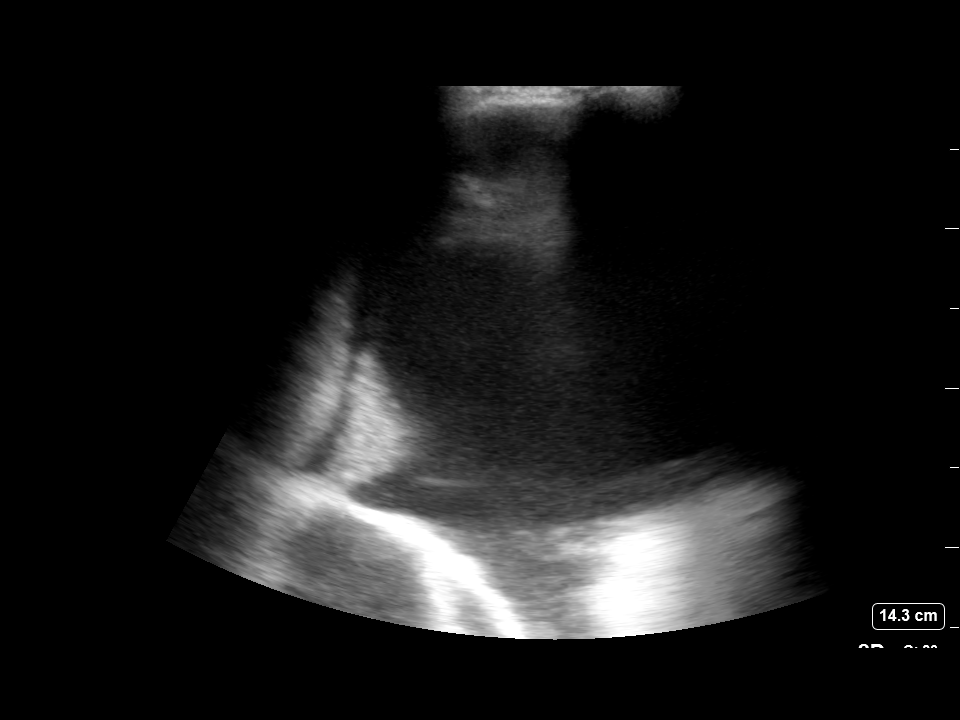
[im 3/3]
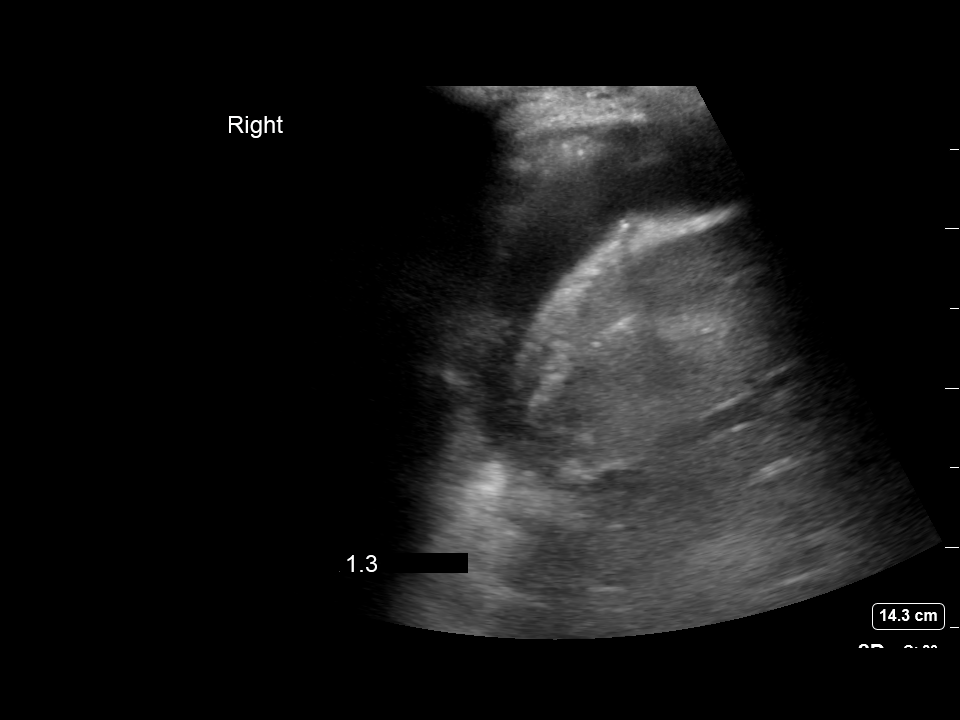

[3 of 3 positions shown; findings below may reference images not displayed]

EXAM:
ULTRASOUND GUIDED RIGHT THORACENTESIS

MEDICATIONS:
8 mL 1% lidocaine

COMPLICATIONS:
None immediate.

PROCEDURE:
An ultrasound guided thoracentesis was thoroughly discussed with the
patient and questions answered. The benefits, risks, alternatives
and complications were also discussed. The patient understands and
wishes to proceed with the procedure. Written consent was obtained.

Ultrasound was performed to localize and mark an adequate pocket of
fluid in the right chest. The area was then prepped and draped in
the normal sterile fashion. 1% Lidocaine was used for local
anesthesia. Under ultrasound guidance a 6 Fr Safe-T-Centesis
catheter was introduced. Thoracentesis was performed. The catheter
was removed and a dressing applied.
FINDINGS: A total of approximately 1.3 L of hazy light brown fluid was
removed. Samples were sent to the laboratory as requested by the
clinical team.

Post procedure chest X-ray reviewed, negative for pneumothorax.
IMPRESSION: Successful ultrasound guided right thoracentesis yielding 1.3 L of
pleural fluid.

## 2022-05-17 IMAGING — CR DG CHEST 1V
1 series · 1 of 1 positions shown · non-contrast
Comparison: IR ultrasound, earlier same day. Chest XR, 09/10/2021
and 09/07/2018. CT chest, 08/26/2021.

CLINICAL DATA: Post thoracentesis.

EXAM:
PORTABLE CHEST 1 VIEW

[chest ap]
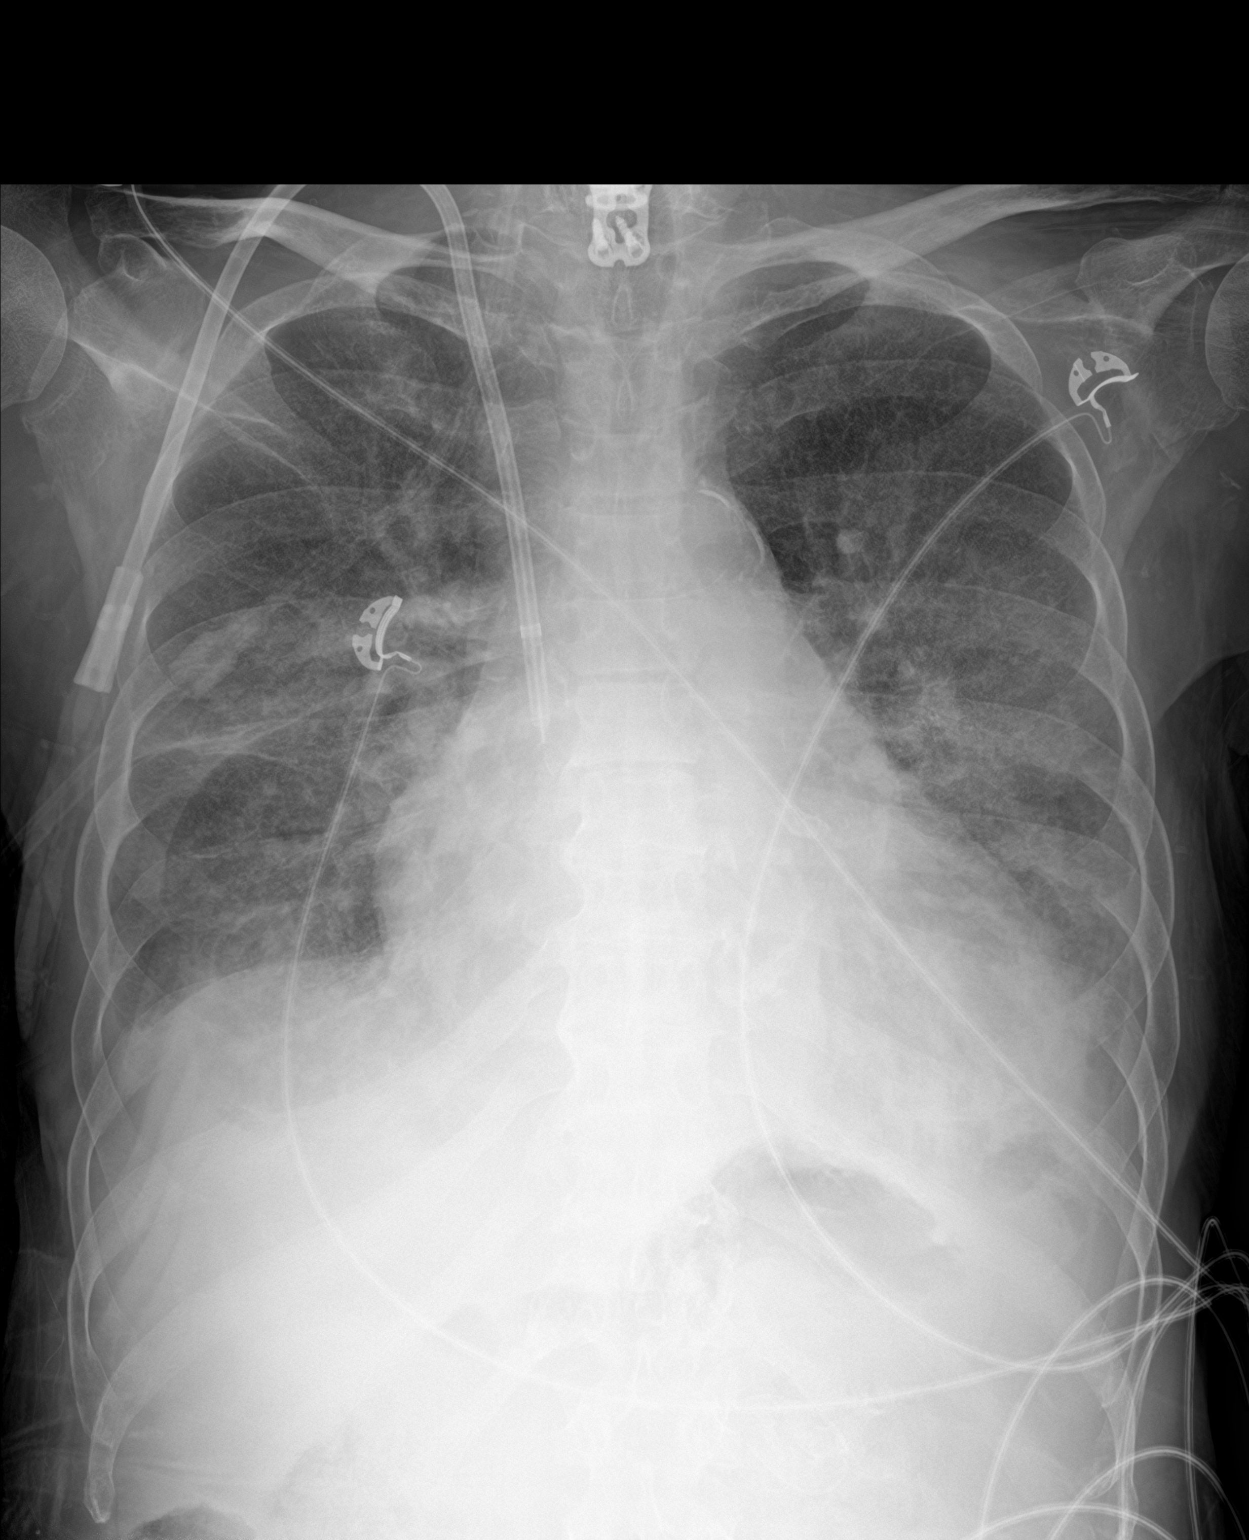

[1 of 1 positions shown; findings below may reference images not displayed]

FINDINGS: Support lines: RIGHT IJ CVC with catheter tip at the proximal RIGHT
atrium. Overlying pacer leads.

Enlargement of the cardiac silhouette. Aortic arch calcifications.
Interstitial thickening and coarse bilateral perihilar opacities. No
focal consolidation. Trace residual RIGHT pleural effusion. No
pneumothorax. Cervical fusion hardware. No interval osseous
abnormality.
IMPRESSION: 1. Interval removal of RIGHT pleural effusion with trace residual.
No postprocedure pneumothorax.
2. Cardiomegaly with pulmonary findings consistent with pulmonary
edema.
3. Aortic Atherosclerosis ([id number]-[id number].F).

## 2022-05-25 IMAGING — DX DG CHEST 1V PORT
1 series · 1 of 1 positions shown · non-contrast
Comparison: Chest x-ray dated September 11, 2021

CLINICAL DATA: Concern for sepsis

EXAM:
PORTABLE CHEST 1 VIEW

[chest]
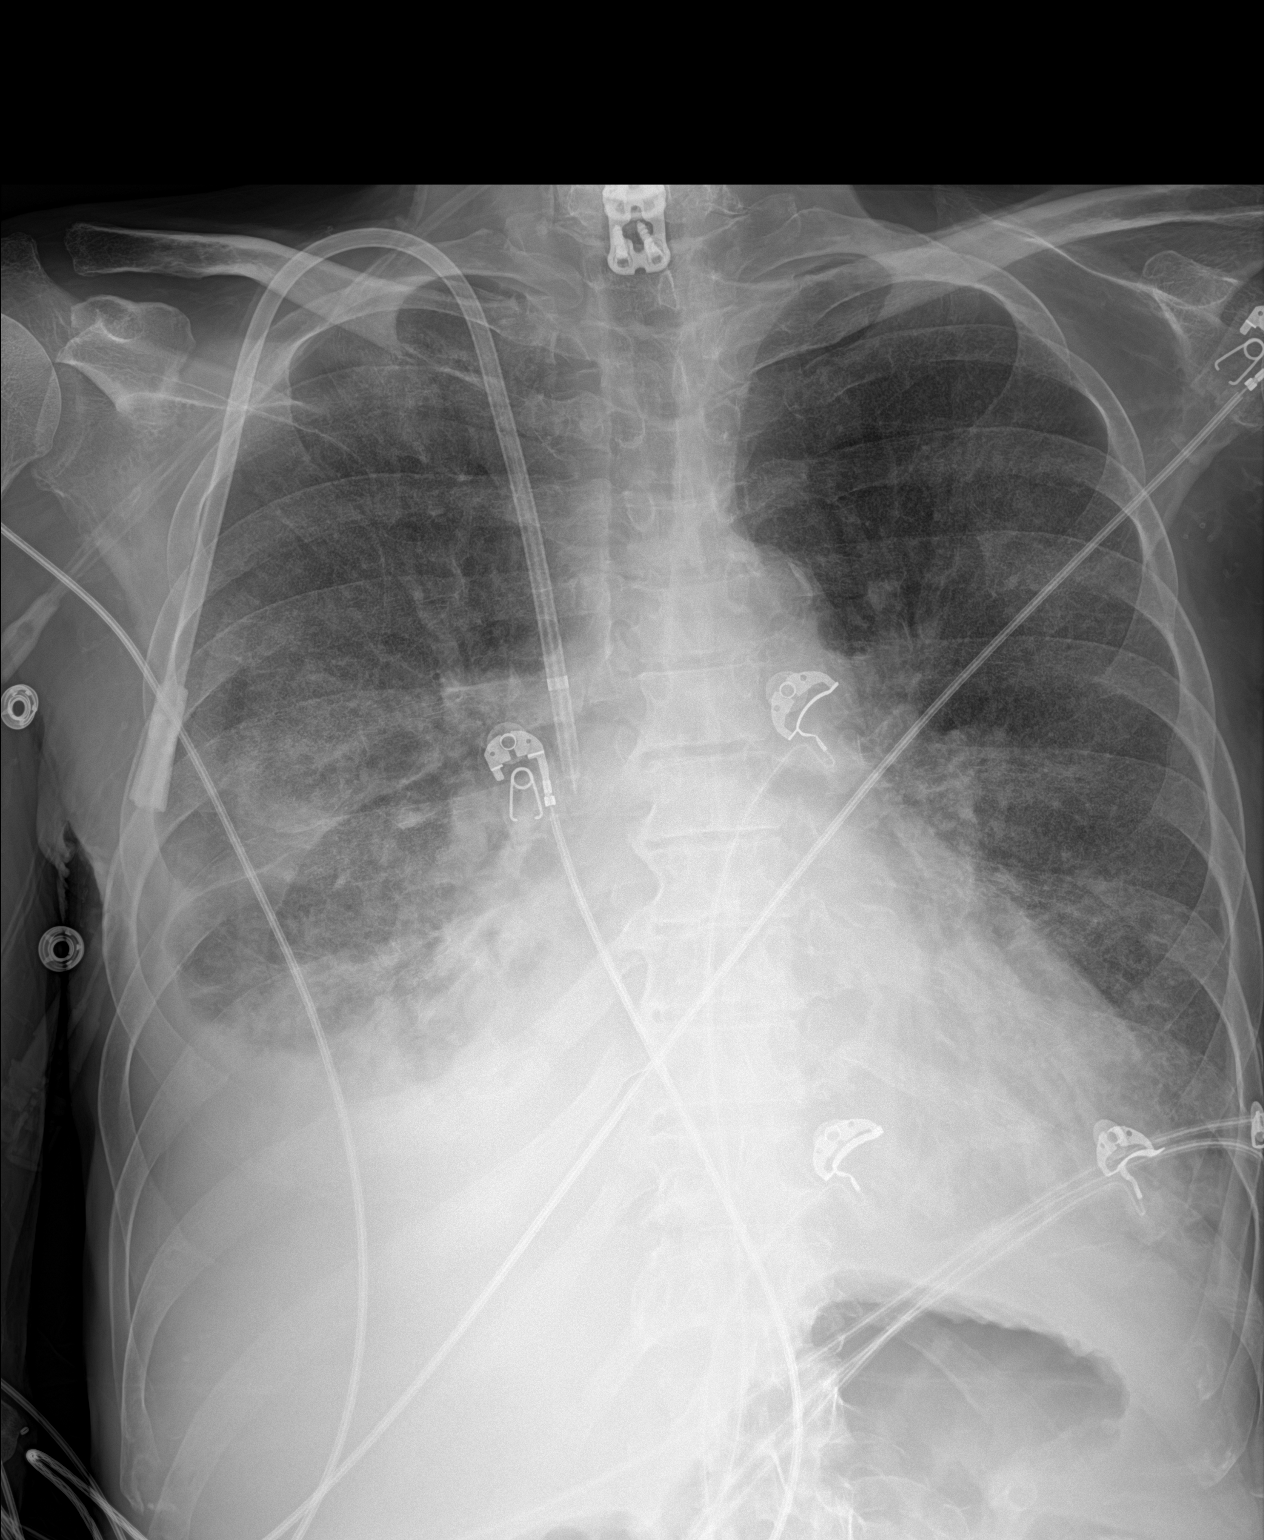

[1 of 1 positions shown; findings below may reference images not displayed]

FINDINGS: Cardiac and mediastinal contours are unchanged. Stable position of
right central venous line. Diffuse bilateral interstitial opacities.
No focal consolidation. Small right-greater-than-left pleural
effusions, right pleural effusion is increased in size. No evidence
of pneumothorax.
IMPRESSION: 1. Similar diffuse bilateral interstitial opacities, likely due to
pulmonary edema.
2. Increased small right pleural effusion.

## 2022-06-04 IMAGING — DX DG CHEST 1V PORT
1 series · 1 of 1 positions shown · non-contrast
Comparison: Ten days ago

CLINICAL DATA: EGA.

EXAM:
PORTABLE CHEST 1 VIEW

[chest]
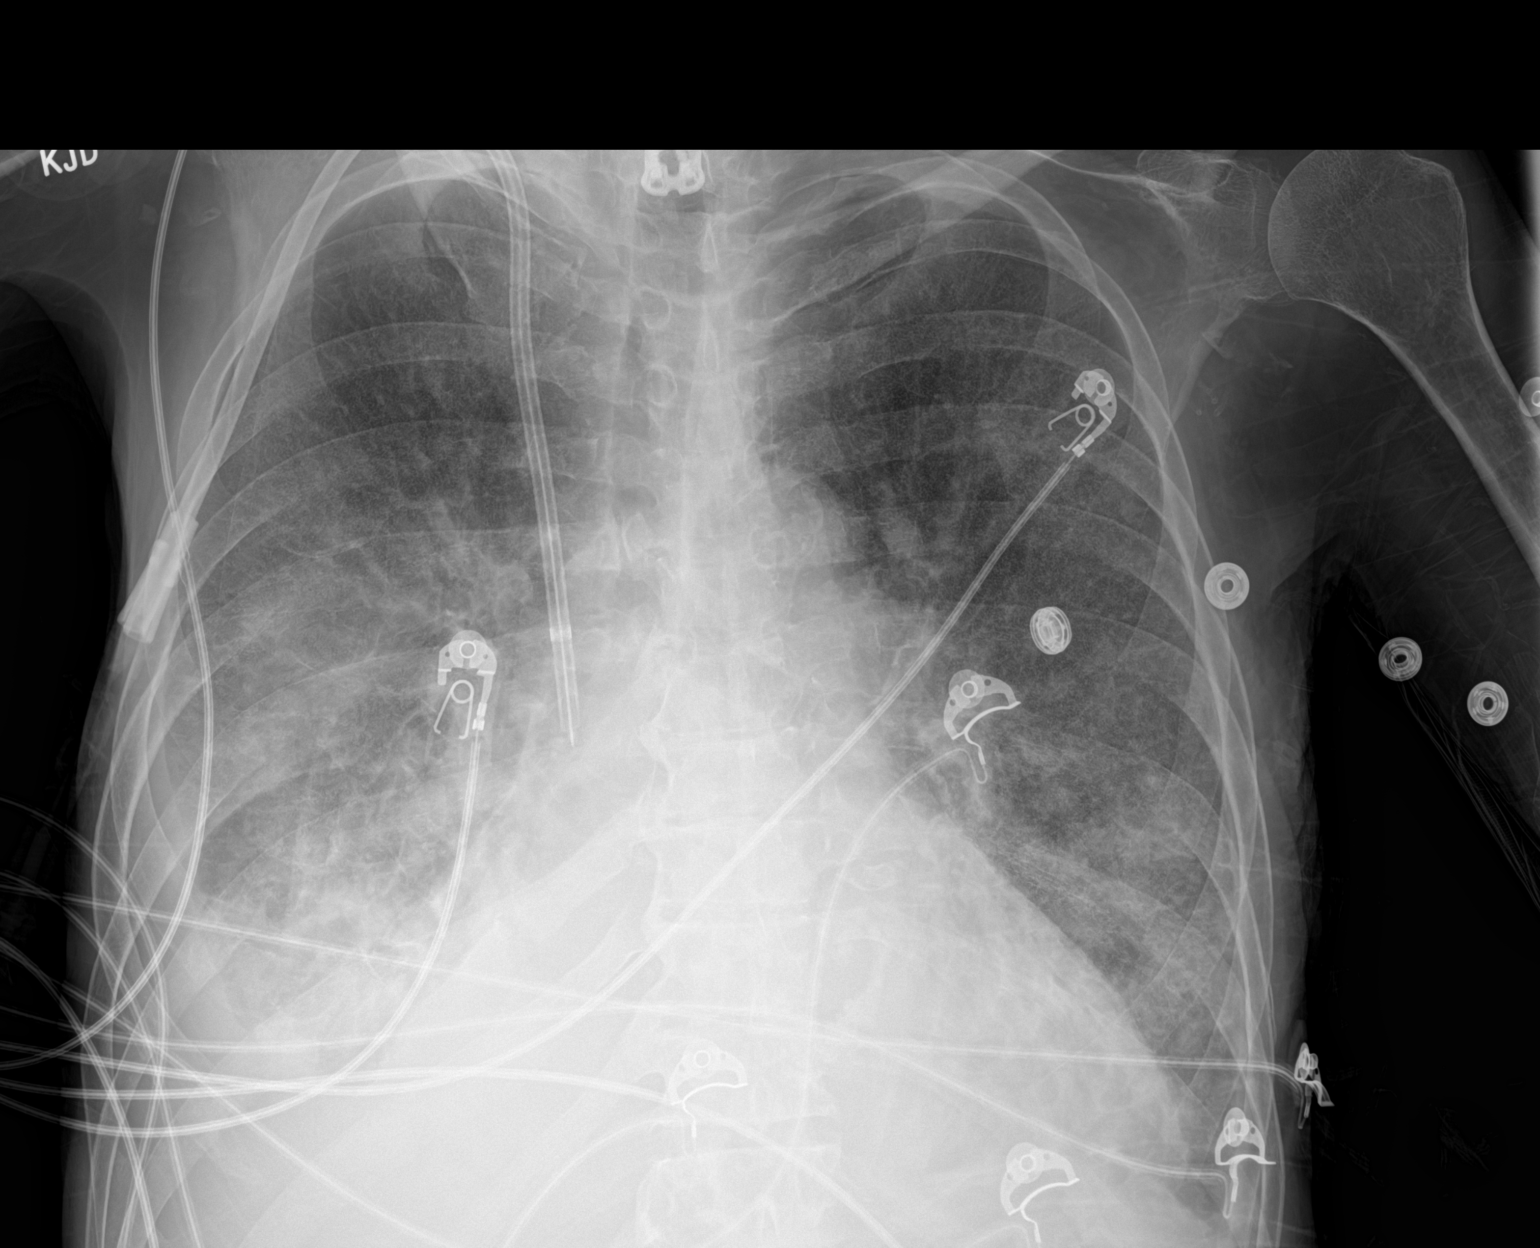

[1 of 1 positions shown; findings below may reference images not displayed]

FINDINGS: Diffuse interstitial opacity with cephalized blood flow and moderate
right pleural effusion. Dialysis catheter on the right with tip at
the lower SVC. Cardiomegaly. No pneumothorax.
IMPRESSION: Pulmonary edema and right pleural effusion.

## 2022-06-19 IMAGING — CT CT CHEST W/O CM
2 of 4 series · 15 of 36 positions shown, 18 images · non-contrast
Comparison: 08/26/2021

CLINICAL DATA: Chest trauma, blunt.  Fall.  Left rib pain.



[Series 3: chest w/o 2mm st · axial · non-contrast · 0.73mm/px · z∈[+1023,+1317]mm · 12 of 175 slices shown, 15 images]
[im 14/175  mediastinal]
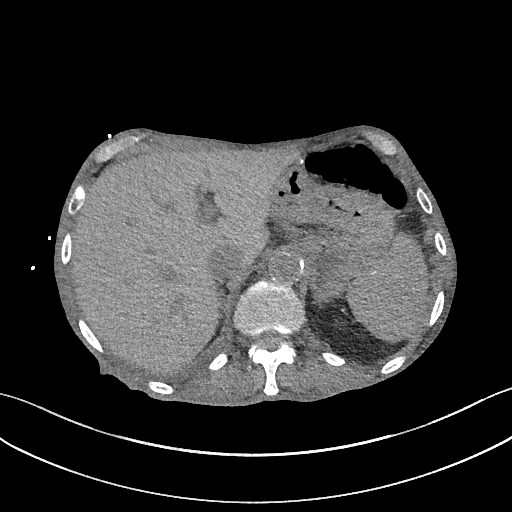
[im 14/175  lung]
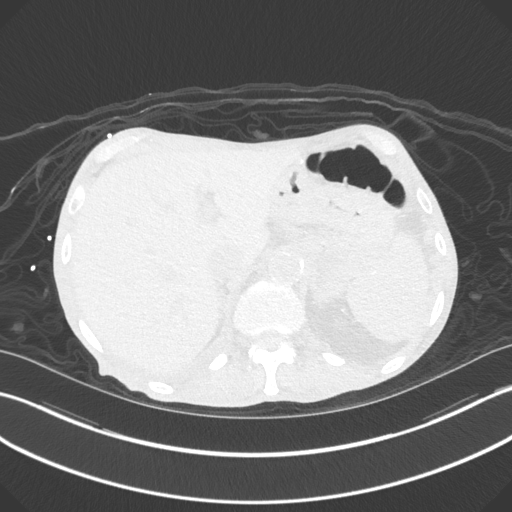
[im 27/175  lung]
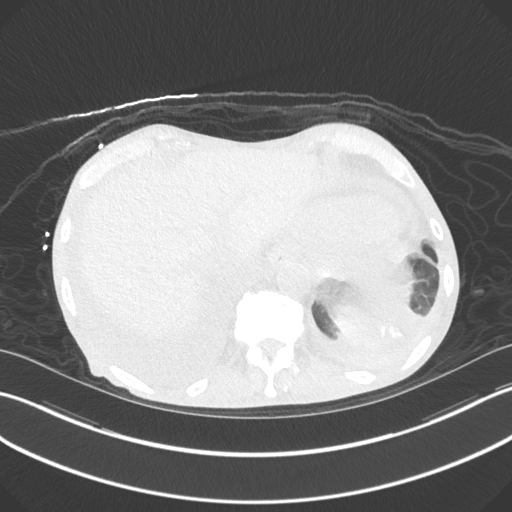
[im 41/175  lung]
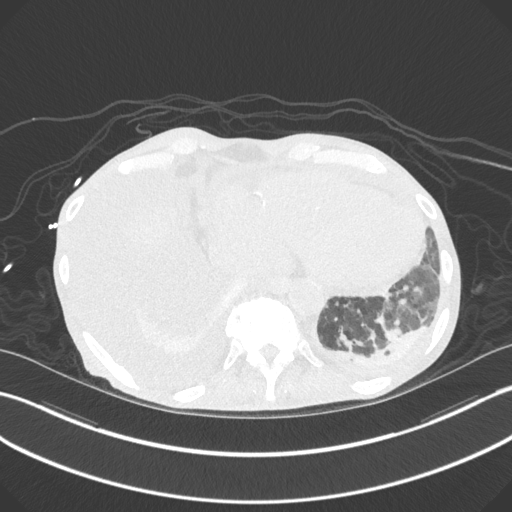
[im 54/175  lung]
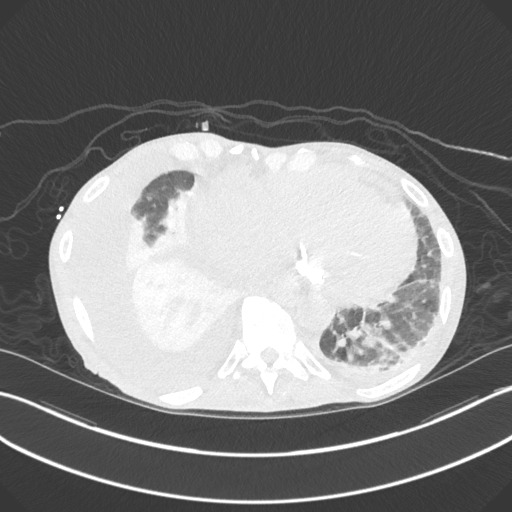
[im 67/175  mediastinal]
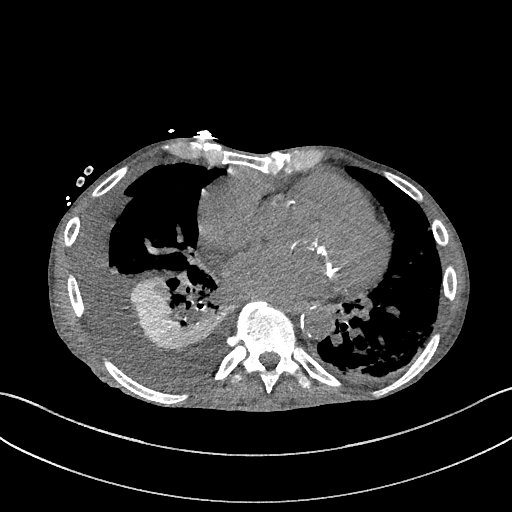
[im 67/175  lung]
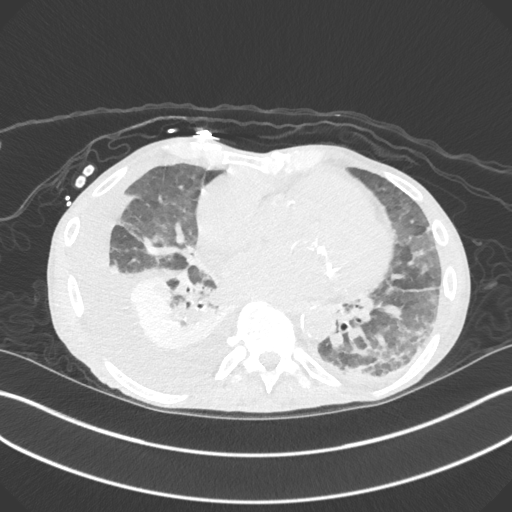
[im 81/175  lung]
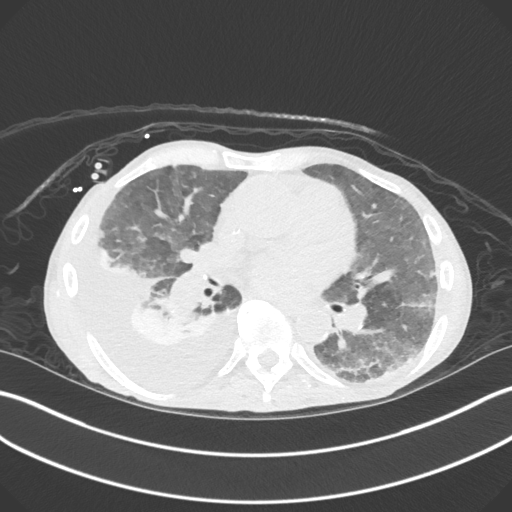
[im 94/175  lung]
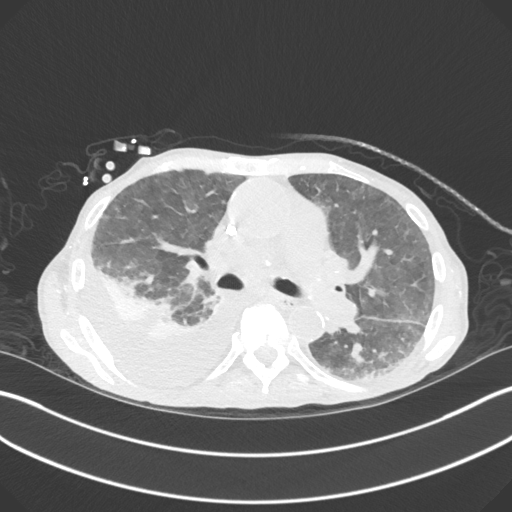
[im 108/175  lung]
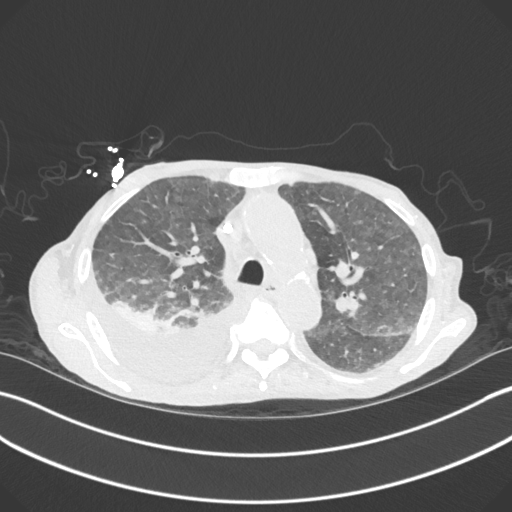
[im 121/175  mediastinal]
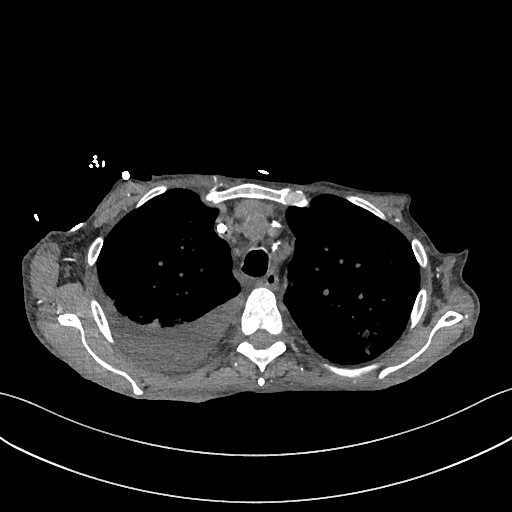
[im 121/175  lung]
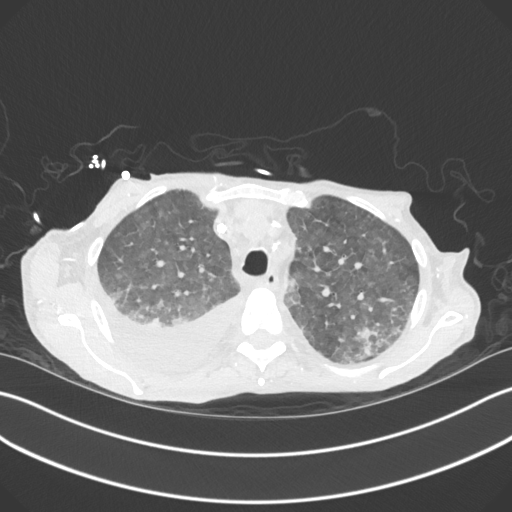
[im 134/175  lung]
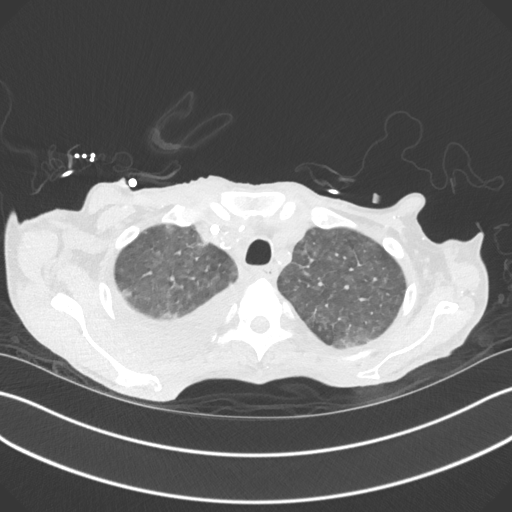
[im 148/175  lung]
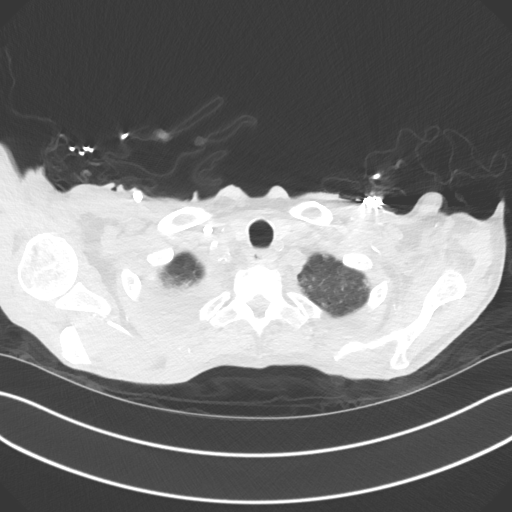
[im 161/175  lung]
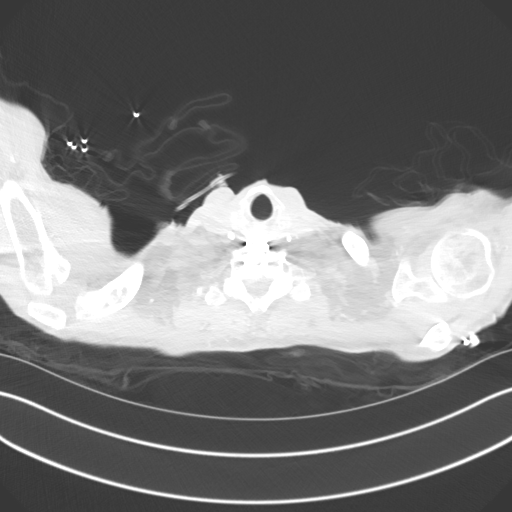

[Series 6: chest w/o 2mm st cor · coronal · non-contrast · 0.68mm/px · 3 of 117 slices shown]
[im 24/117  lung]
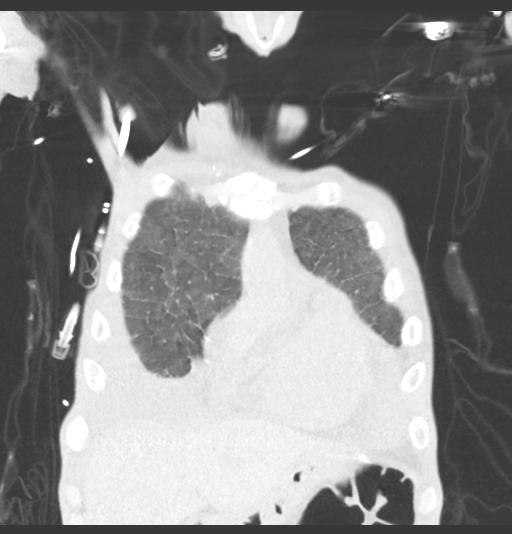
[im 47/117  lung]
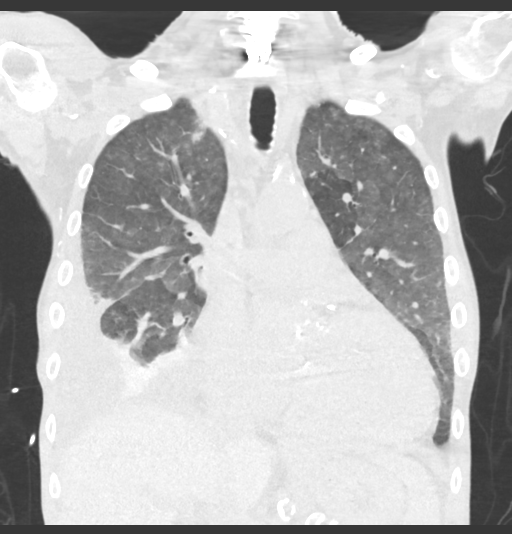
[im 70/117  lung]
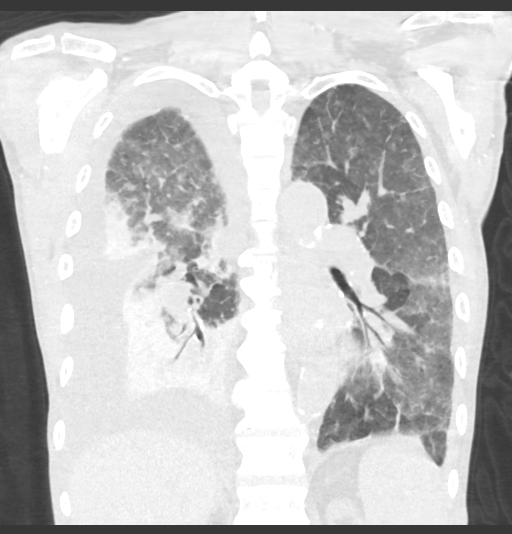

[15 of 36 positions shown; findings below may reference images not displayed]

FINDINGS: Cardiovascular: Aneurysmal dilatation of the ascending thoracic
aorta measuring up to 4.3 cm, stable since prior study.
Cardiomegaly. Densely calcified mitral valve. Scattered aortic and
coronary artery calcifications.

Mediastinum/Nodes: Mildly enlarged mediastinal lymph nodes.
Subcarinal lymph node has a short axis diameter of 1.4 cm. 11 mm
right paratracheal lymph node. Similarly sized prevascular and AP
window lymph nodes. No axillary adenopathy. Difficult to
visualize/evaluate the hila without intravenous contrast.

Lungs/Pleura: Large right pleural effusion, slightly increased since
prior study. Compressive atelectasis in the right lower lobe.
Bilateral ground-glass airspace opacities, favor edema. Small left
pleural effusion with left base atelectasis.

Upper Abdomen: Imaging into the upper abdomen demonstrates no acute
findings.

Musculoskeletal: Chest wall soft tissues are unremarkable.
Nondisplaced fractures through the lateral left 6th and 7th ribs. No
associated pneumothorax.
IMPRESSION: Left lateral 6th and 7th rib fractures.  No pneumothorax.

Large right pleural effusion, slightly increased since prior study.
Compressive atelectasis in the right lower lobe. Small left pleural
effusion with left base atelectasis.

Cardiomegaly. Diffuse ground-glass opacities throughout the lungs,
favor edema.

4.3 cm ascending thoracic aortic aneurysm. Recommend annual imaging
followup by CTA or MRA. This recommendation follows 9111
ACCF/AHA/AATS/ACR/ASA/SCA/ZURAWSKI/GEBBINK/KANDY/HERRI Guidelines for the
Diagnosis and Management of Patients with Thoracic Aortic Disease.
Circulation. 9111; 121: E266-e369. Aortic aneurysm NOS ([id number]-X8R.D)

Mildly prominent mediastinal lymph nodes, likely reactive.

Coronary artery disease.

Aortic Atherosclerosis ([id number]-[id number].P).

## 2022-06-19 IMAGING — DX DG CHEST 1V PORT
1 series · 1 of 1 positions shown · non-contrast
Comparison: AP chest 09/29/2021

CLINICAL DATA: Chest pain. Patient had 2.5 hours and 3 hours
scheduled dialysis session than tripped at home and fell onto table
on left side. Left rib pain.

EXAM:
PORTABLE CHEST 1 VIEW

[chest]
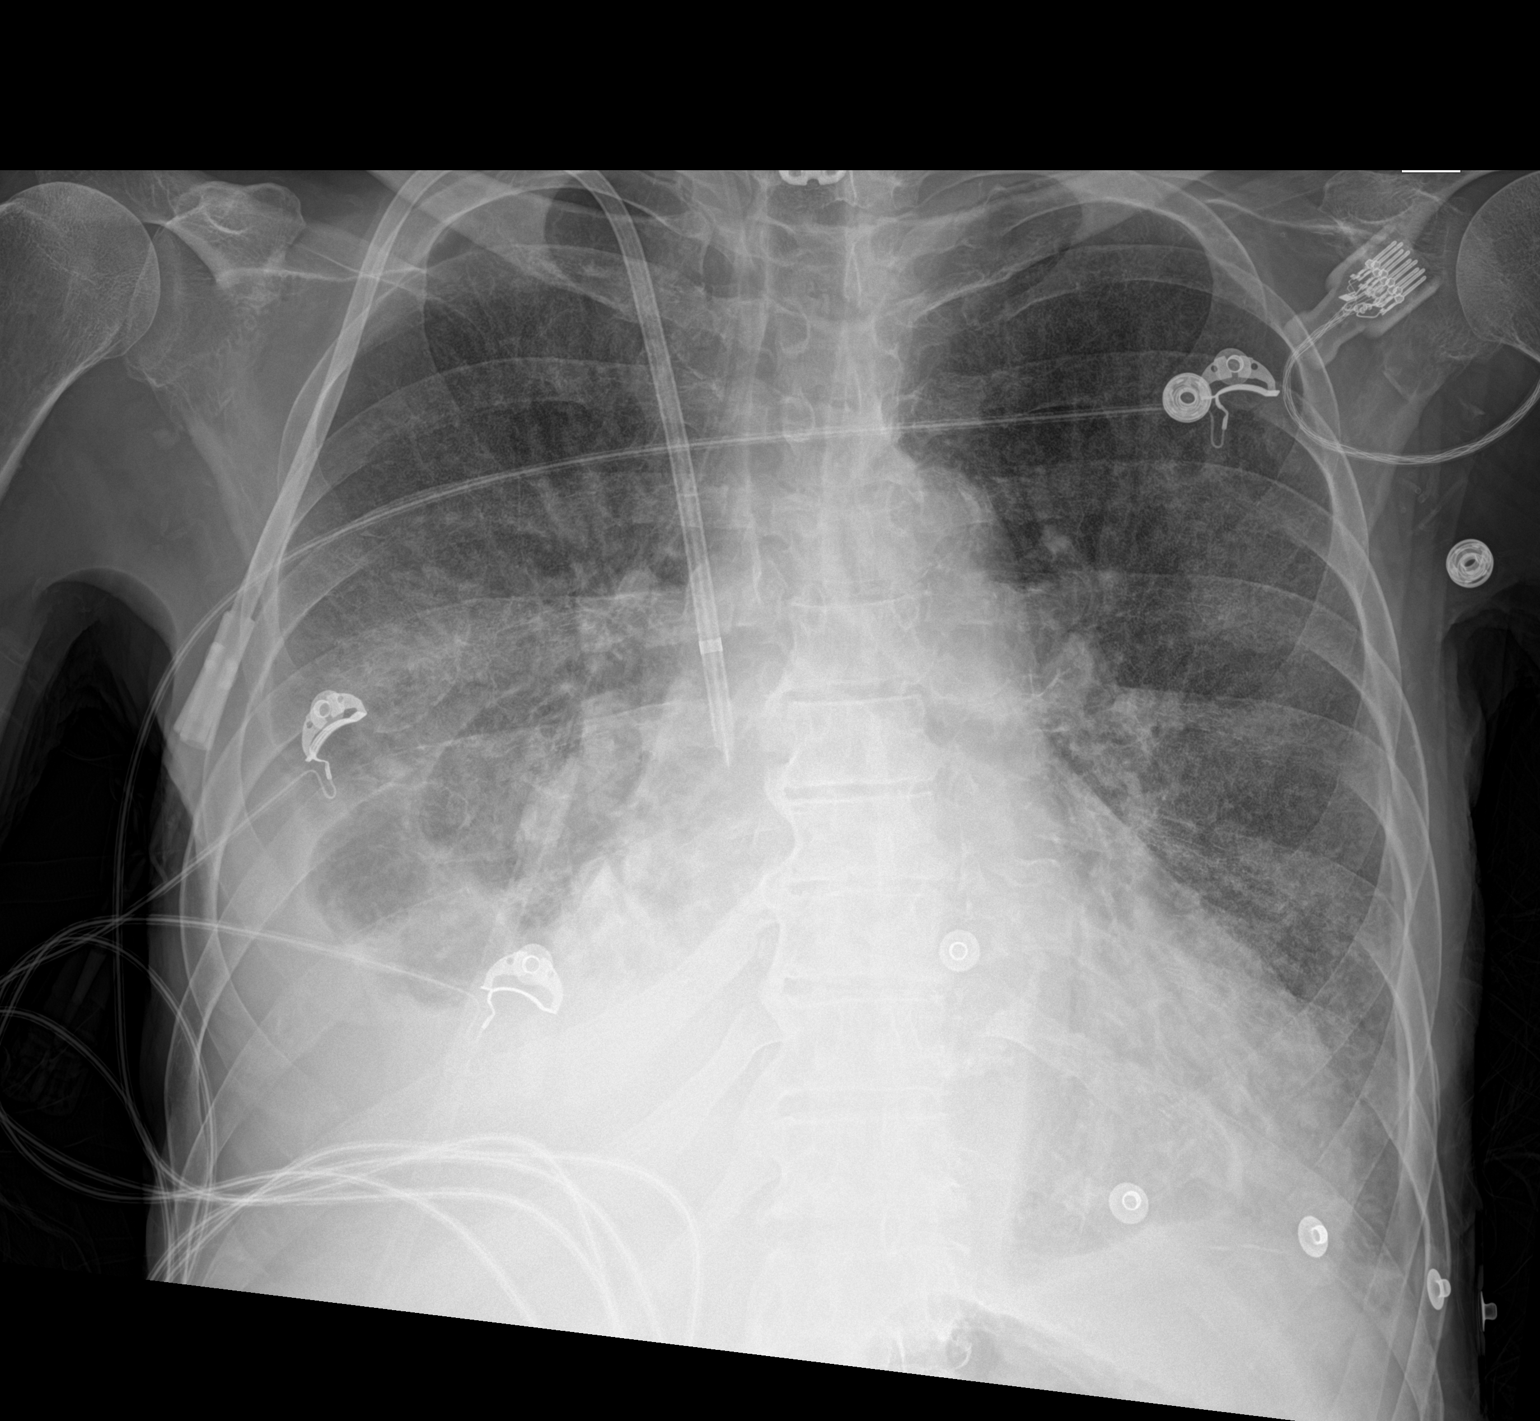

[1 of 1 positions shown; findings below may reference images not displayed]

FINDINGS: Right internal jugular dual-lumen central venous catheter tip again
overlies the superior vena cava/right atrial junction. Cardiac
silhouette is again moderately enlarged. Mediastinal contours are
grossly within normal limits with calcification again seen within
the aortic arch. Partial visualization of ACDF hardware.

Moderate right pleural effusion with right mid and lower lung and
left lower lung heterogeneous airspace opacities, similar to prior.
No pneumothorax. Moderate multilevel degenerative bridging
osteophytes of the mid to lower thoracic spine.
IMPRESSION: :
IMPRESSION: 1. Unchanged enlarged cardiac silhouette.
2. Moderate right pleural effusion with right-greater-than-left
basilar heterogeneous airspace opacities, similar to prior. Findings
are suggestive of pulmonary edema, similar to prior.

## 2022-06-22 IMAGING — DX DG CHEST 1V PORT
1 series · 1 of 1 positions shown · non-contrast
Comparison: October 14, 2021.

CLINICAL DATA: Recurrent right pleural effusion.

EXAM:
PORTABLE CHEST 1 VIEW

[chest]
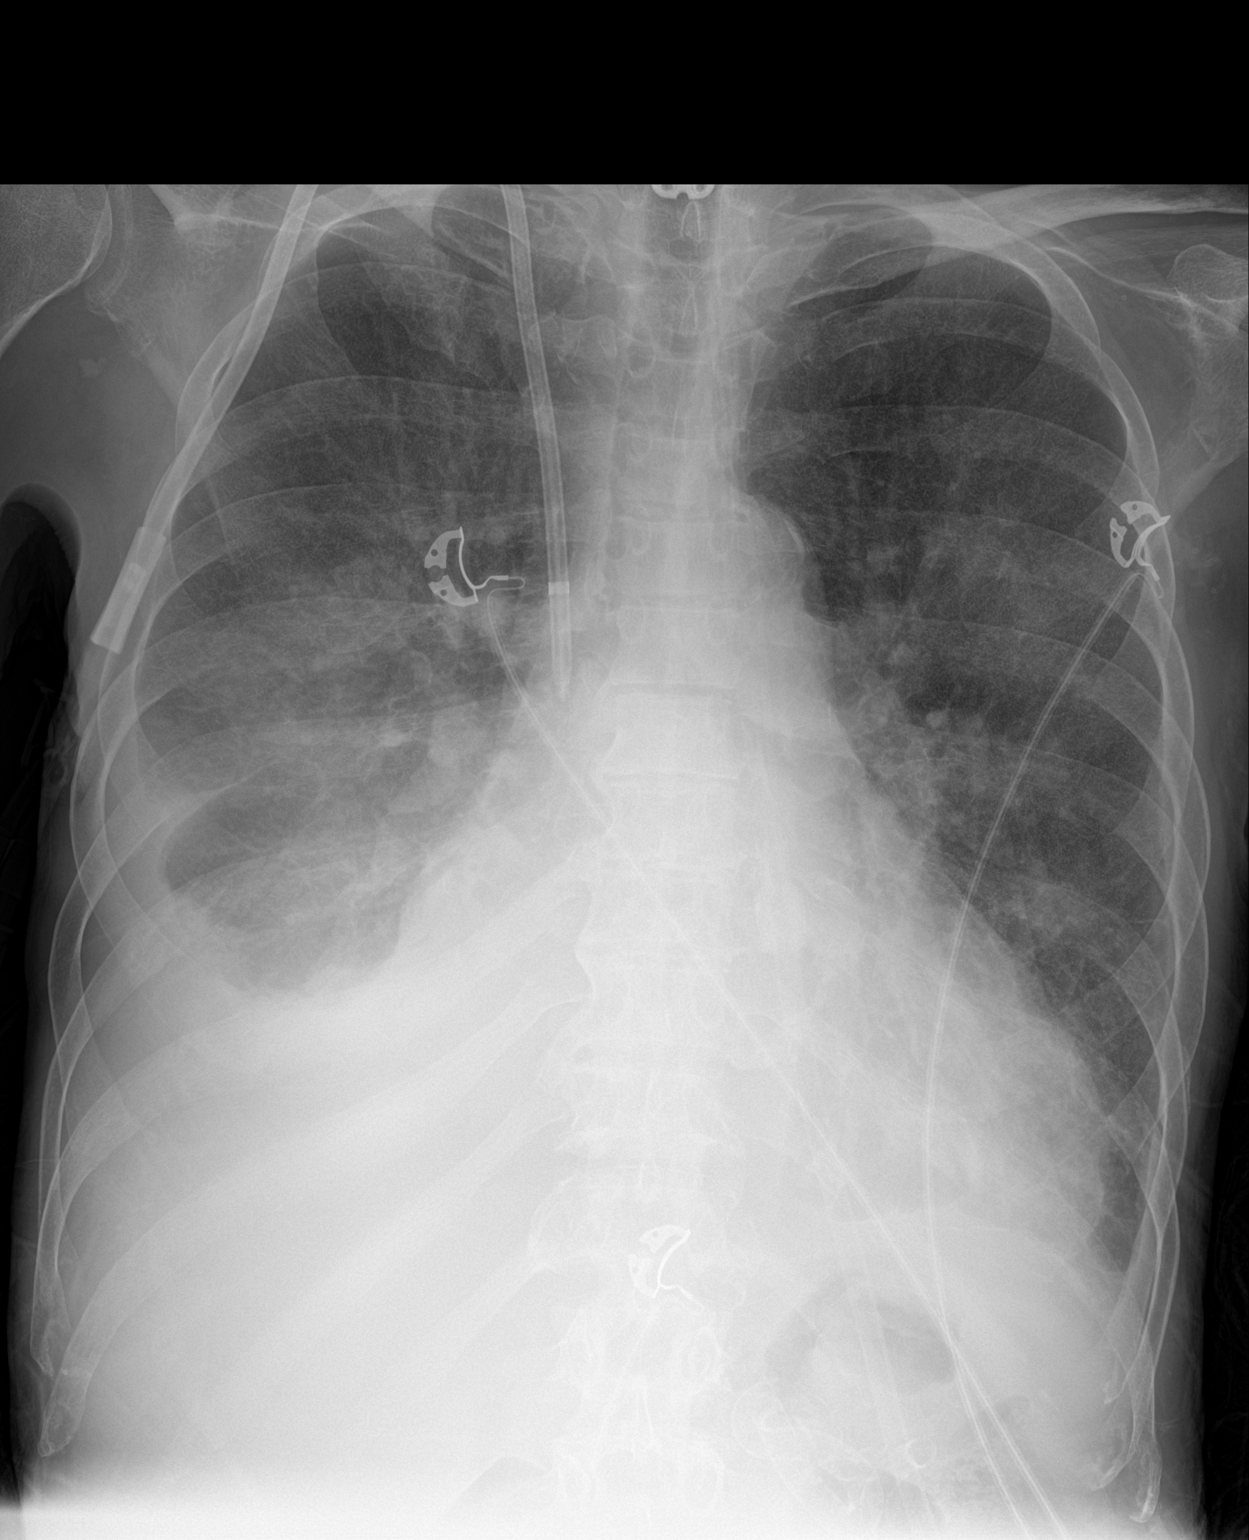

[1 of 1 positions shown; findings below may reference images not displayed]

FINDINGS: Stable cardiomegaly. Right internal jugular dialysis catheter is
unchanged. Stable probable loculated right pleural effusion is
noted. Bilateral lung opacities are noted concerning for edema. Bony
thorax is unremarkable.
IMPRESSION: Stable bilateral lung opacities concerning for edema. Stable
probable loculated right pleural effusion.

## 2022-07-22 IMAGING — DX DG CHEST 1V
1 series · 1 of 1 positions shown · non-contrast
Comparison: 11/15/2021

CLINICAL DATA: Chest pain.

EXAM:
CHEST  1 VIEW

[chest ap]
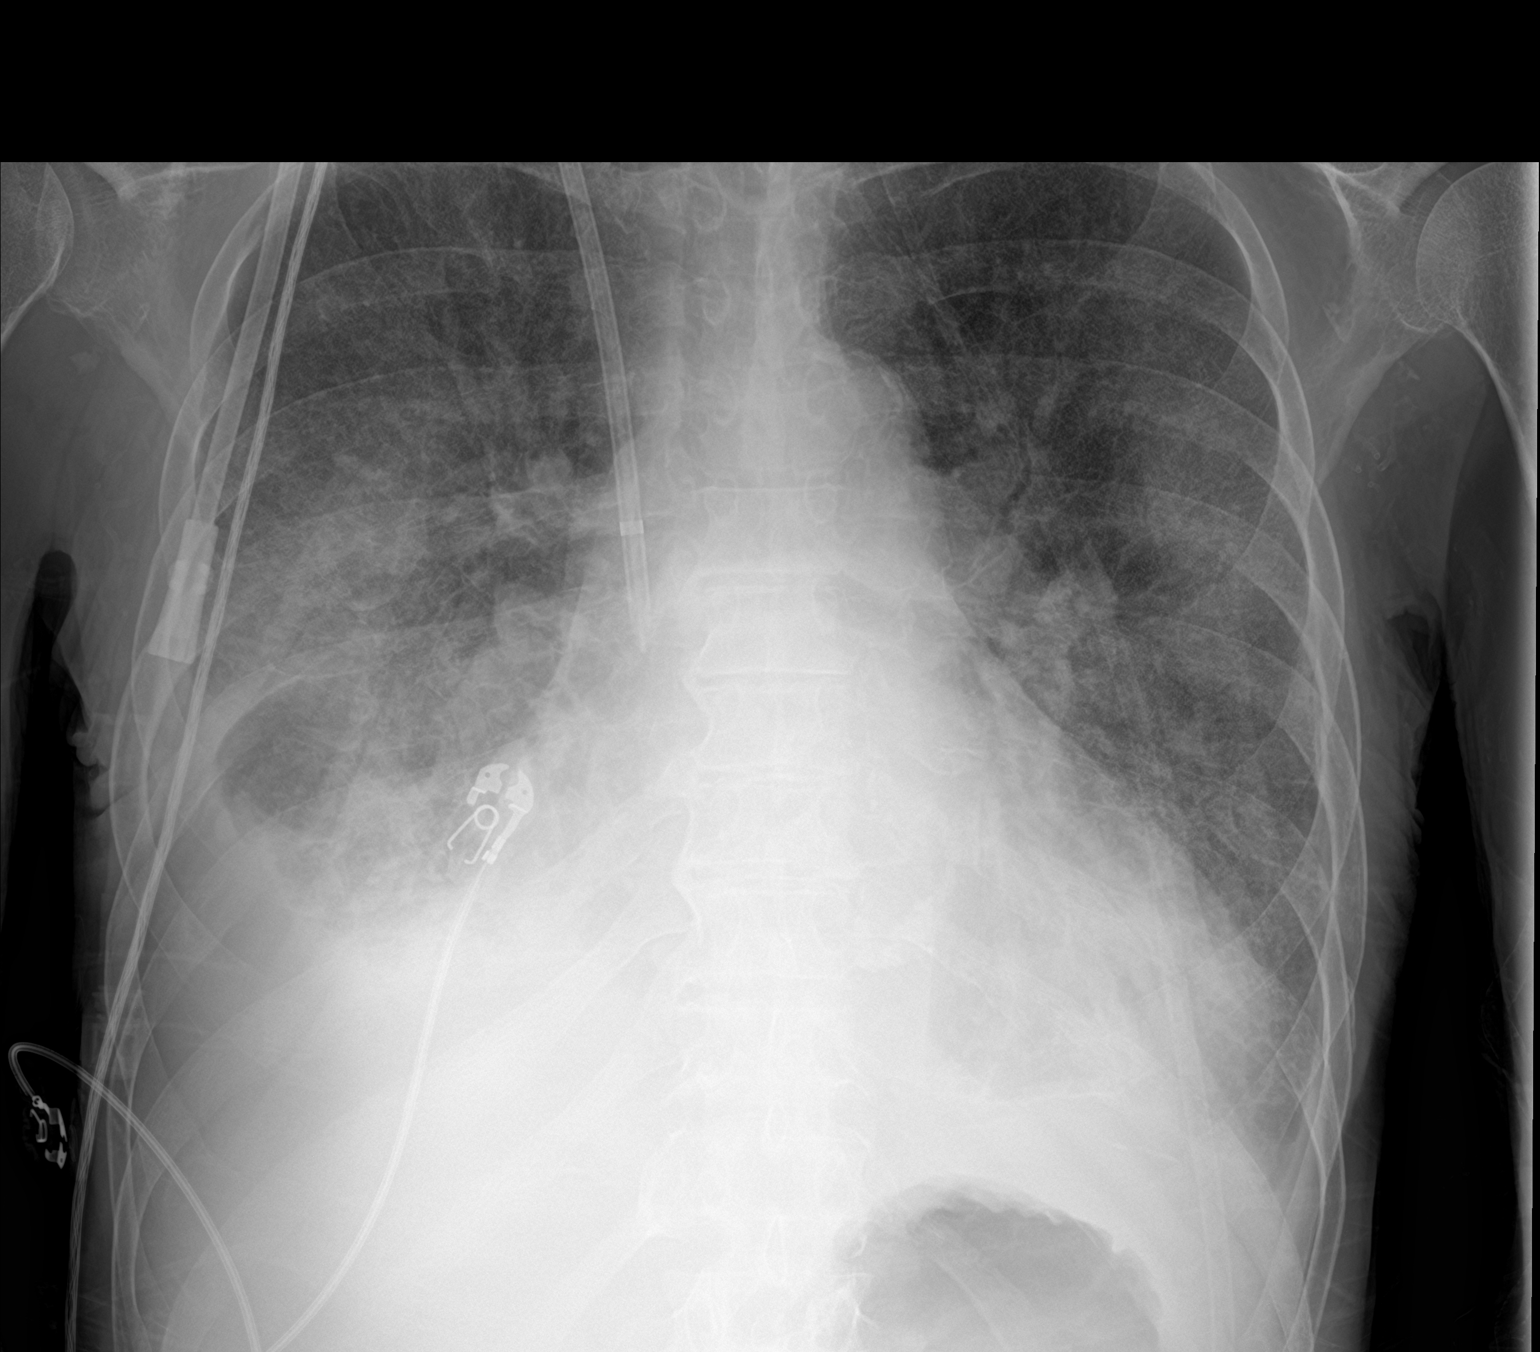

[1 of 1 positions shown; findings below may reference images not displayed]

FINDINGS: Right chest wall dialysis catheter noted with tips at the cavoatrial
junction. Stable cardiomediastinal contours. There is a moderate
volume right pleural effusion which appears unchanged from previous
exam. Diffusely increased interstitial markings are identified
bilaterally compatible with interstitial edema. No signs of
pneumothorax following thoracentesis.
IMPRESSION: 1. Persistent moderate volume right pleural effusion. No
pneumothorax following thoracentesis.
2. Moderate diffuse pulmonary edema.

## 2022-07-22 IMAGING — US US THORACENTESIS ASP PLEURAL SPACE W/IMG GUIDE
1 series · 6 of 6 positions shown · non-contrast
Comparison: none

INDICATION: History of ESRD on HD, chronic diastolic CHF and chronic respiratory
failure with chronic pleural effusions. Request for right-sided
therapeutic thoracentesis.

[Series 1: us thoracentesis asp pleural space w/img guide · 6 of 6 slices shown]
[im 1/6]
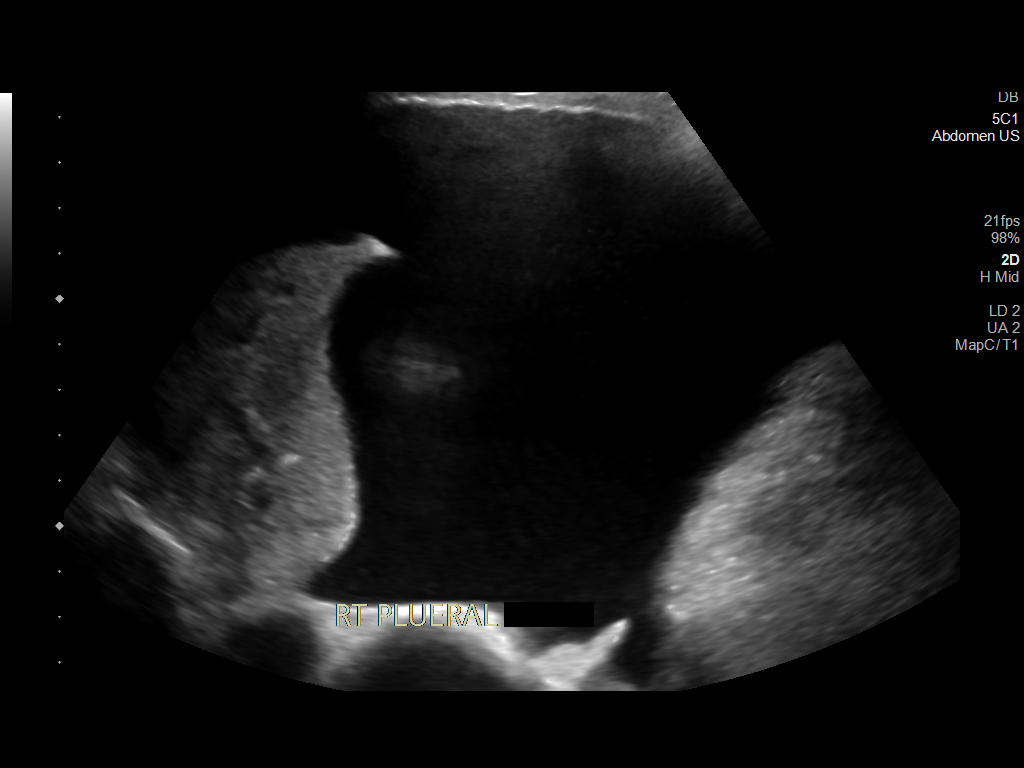
[im 2/6]
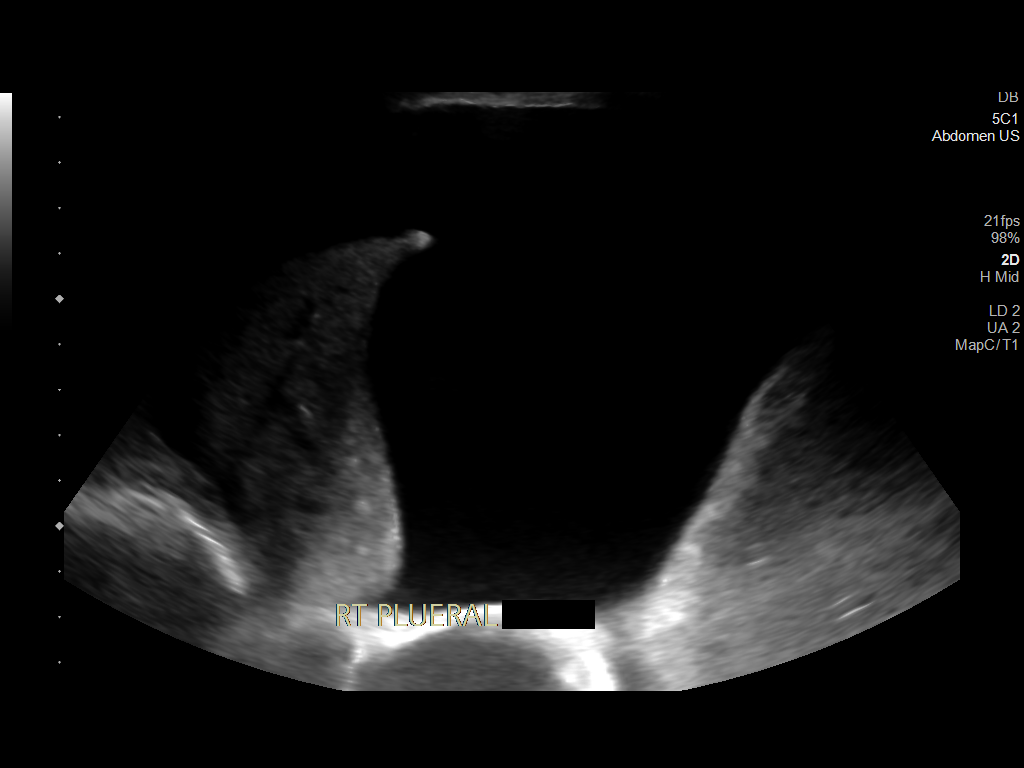
[im 3/6]
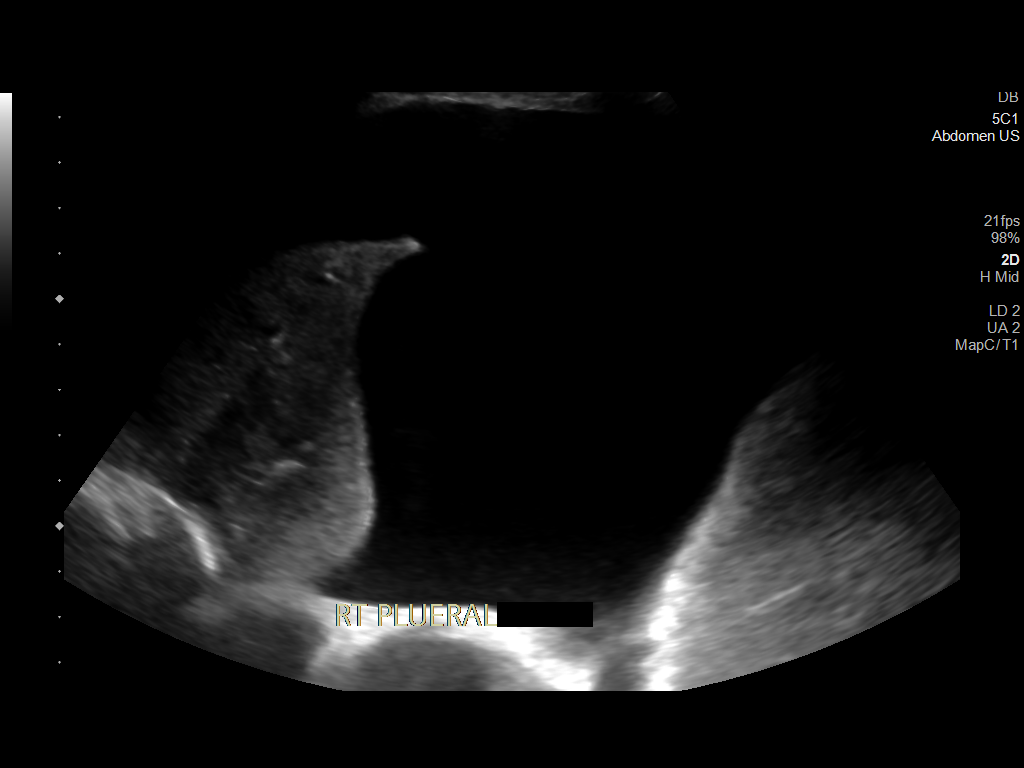
[im 4/6]
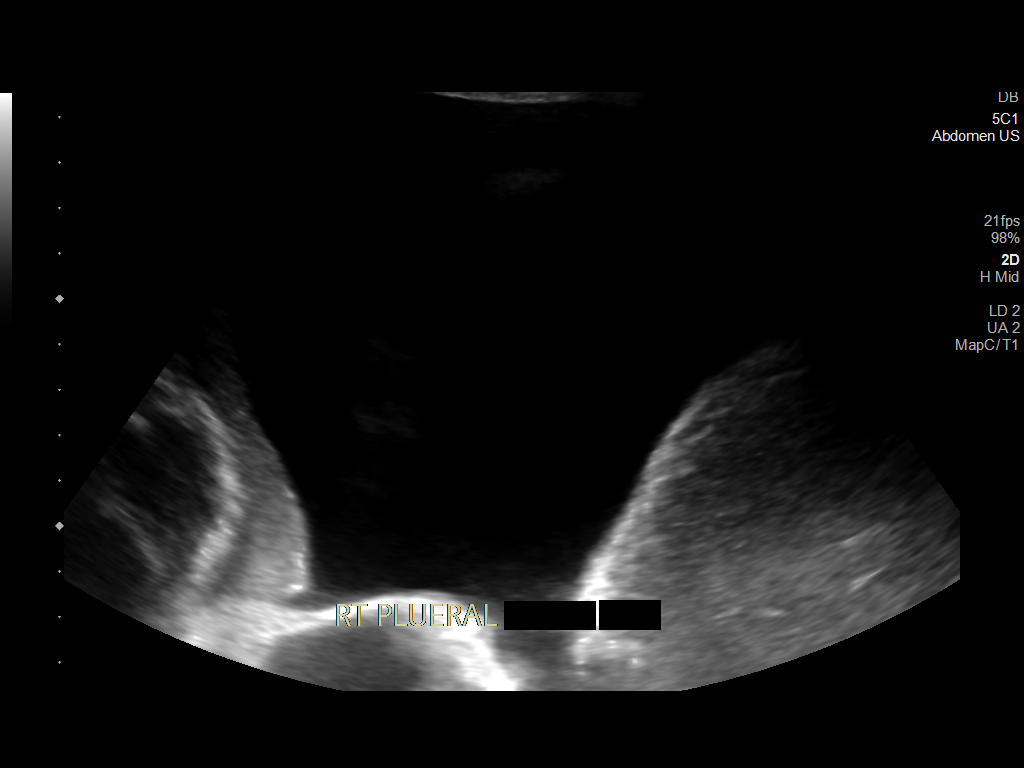
[im 5/6]
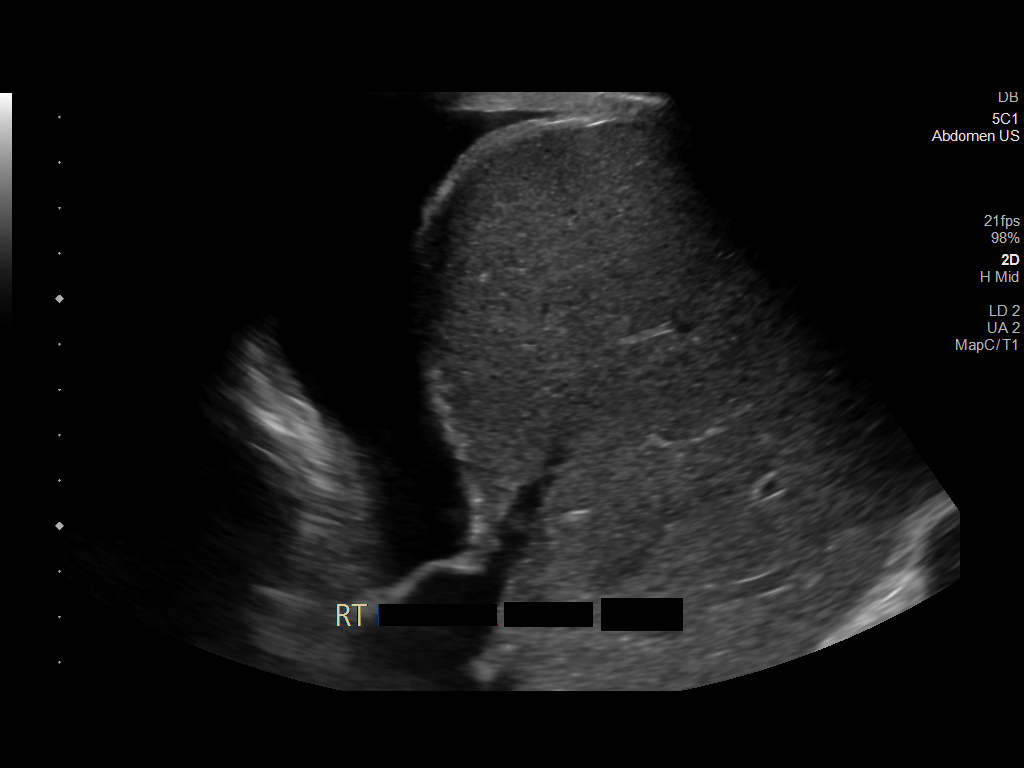
[im 6/6]
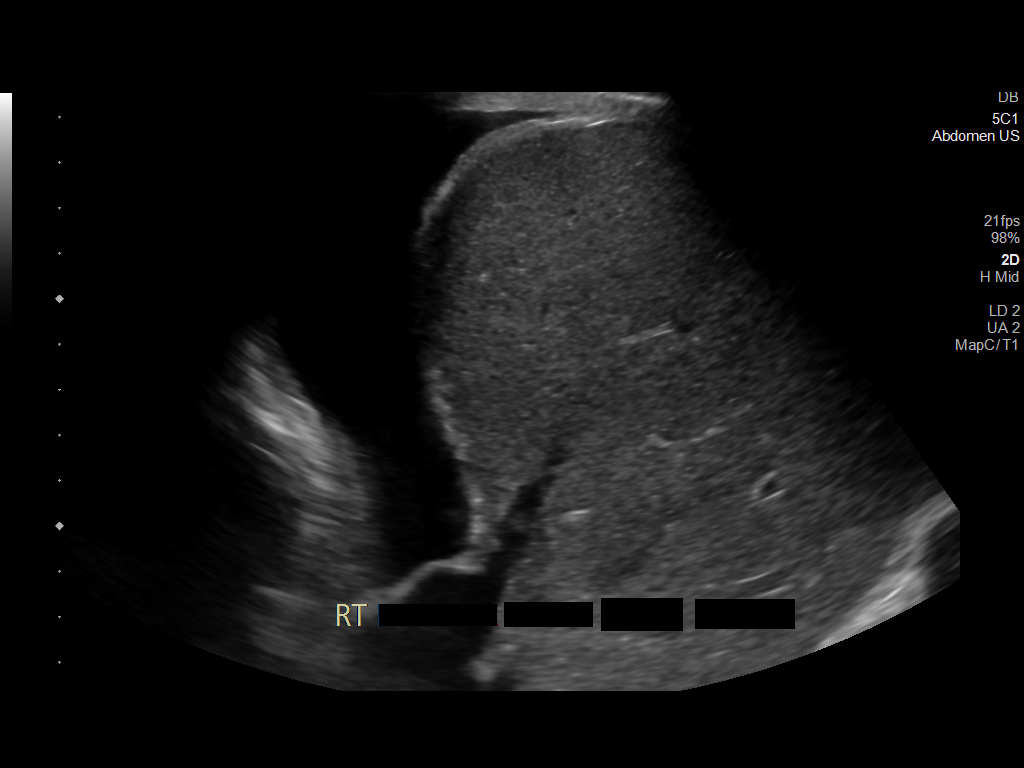

[6 of 6 positions shown; findings below may reference images not displayed]

EXAM:
ULTRASOUND GUIDED THERAPEUTIC RIGHT THORACENTESIS

MEDICATIONS:
10 mL 1 % lidocaine

COMPLICATIONS:
None immediate.

PROCEDURE:
An ultrasound guided thoracentesis was thoroughly discussed with the
patient and questions answered. The benefits, risks, alternatives
and complications were also discussed. The patient understands and
wishes to proceed with the procedure. Written consent was obtained.

Ultrasound was performed to localize and mark an adequate pocket of
fluid in the right chest. The area was then prepped and draped in
the normal sterile fashion. 1% Lidocaine was used for local
anesthesia. Under ultrasound guidance a 6 Fr Safe-T-Centesis
catheter was introduced. Thoracentesis was performed. The catheter
was removed and a dressing applied.
FINDINGS: A total of approximately 800 cc of cloudy, rust colored fluid was
removed.
IMPRESSION: Successful ultrasound guided right thoracentesis yielding 800 cc of
pleural fluid.

## 2022-07-25 IMAGING — DX DG CHEST 1V PORT
1 series · 1 of 1 positions shown · non-contrast
Comparison: Previous studies including the examination of
11/16/2021

CLINICAL DATA: Hypoxia

EXAM:
PORTABLE CHEST 1 VIEW

[chest]
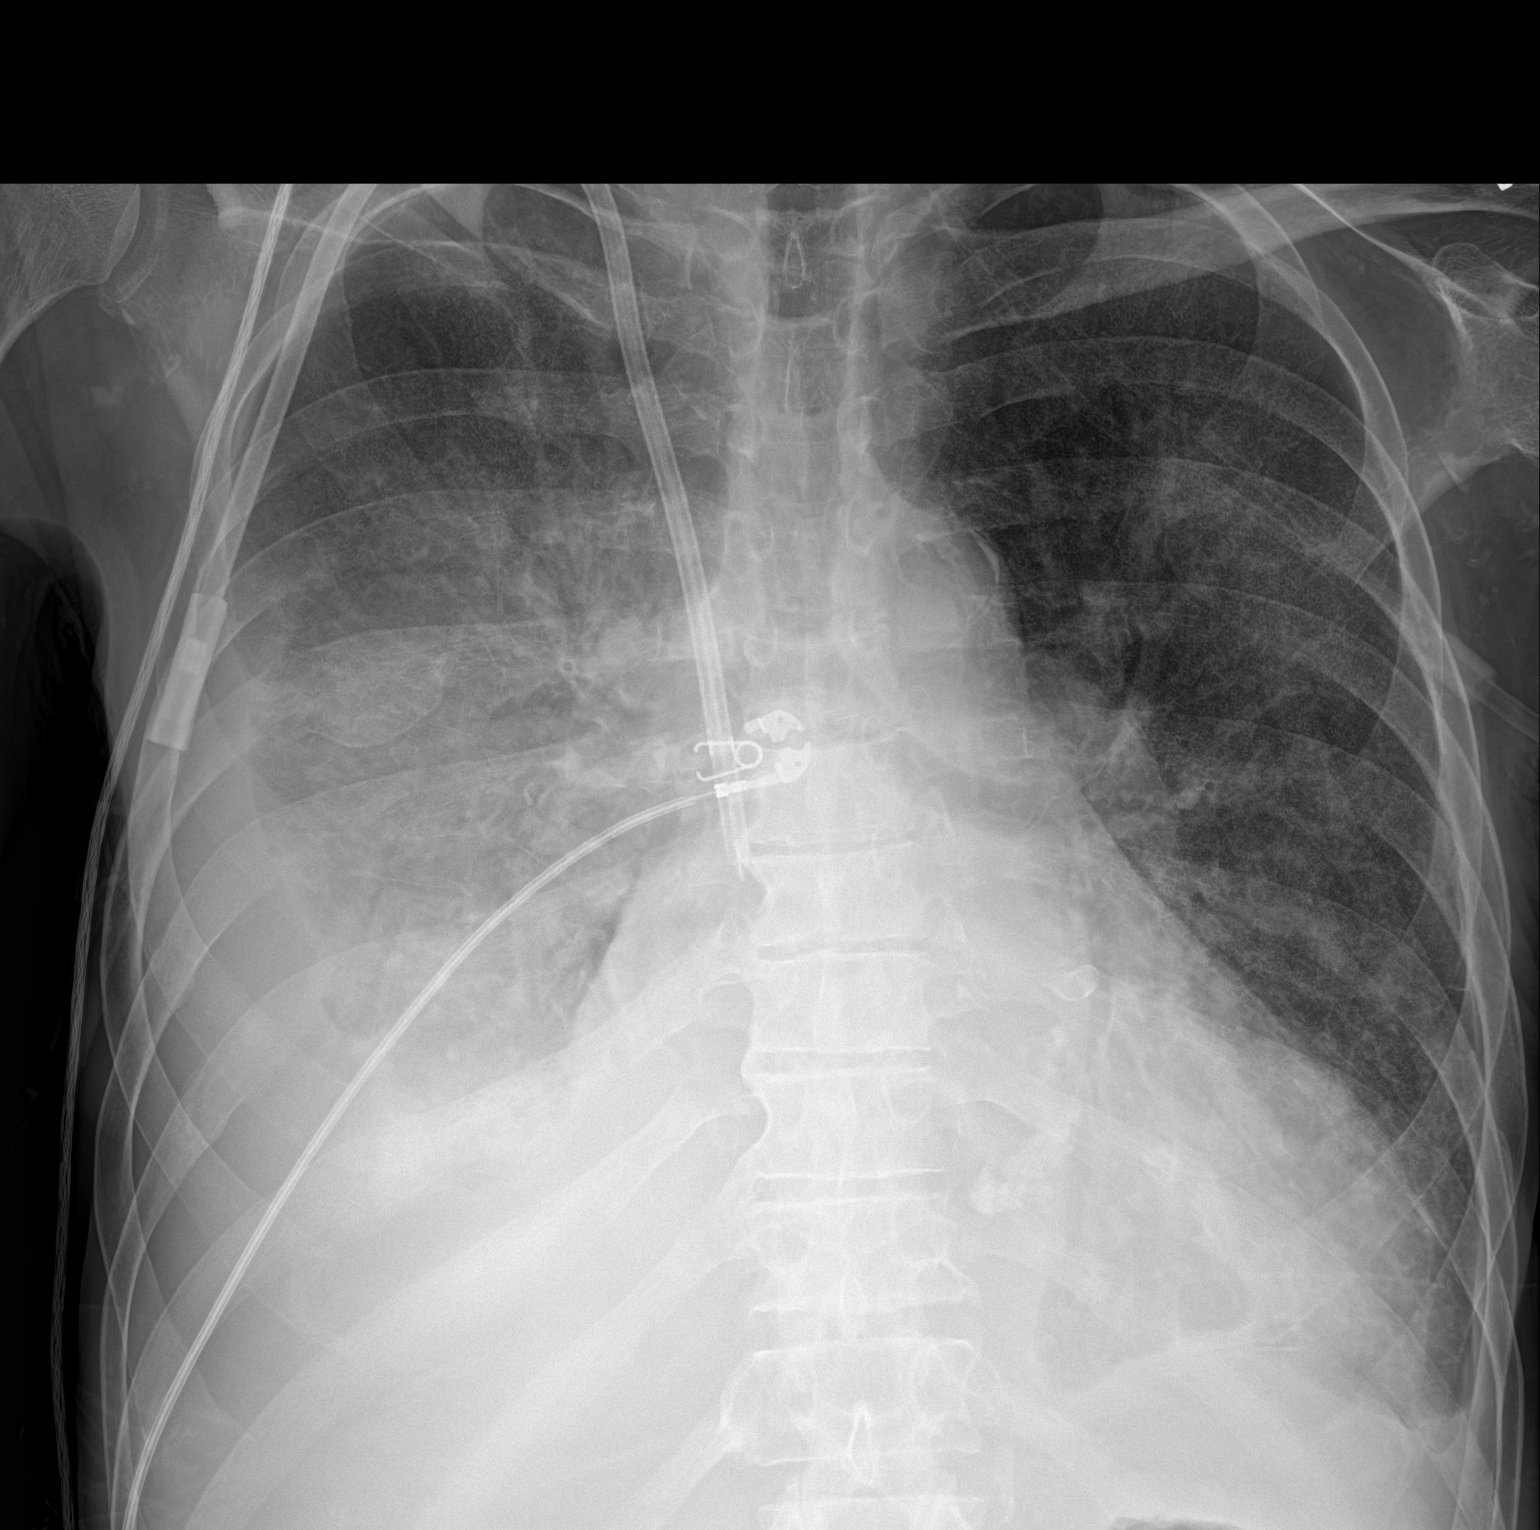

[1 of 1 positions shown; findings below may reference images not displayed]

FINDINGS: Transverse diameter of heart is increased. Tip of dialysis catheter
is seen in the superior vena cava. Central pulmonary vessels are
prominent. Diffuse alveolar densities seen in both lungs, more so on
the right side. There is slight improvement in aeration in the left
parahilar region. There is moderate right pleural effusion with
interval increase. There is small left pleural effusion. There is no
pneumothorax.
IMPRESSION: Cardiomegaly. Pulmonary edema with possible slight improvement in
the left parahilar region. There is moderate right pleural effusion
with interval increase in size. Small left pleural effusion.

## 2022-07-27 IMAGING — DX DG CHEST 1V PORT
1 series · 1 of 1 positions shown · non-contrast
Comparison: November 19, 2021

CLINICAL DATA: Shortness of breath

EXAM:
PORTABLE CHEST 1 VIEW

[chest ap]
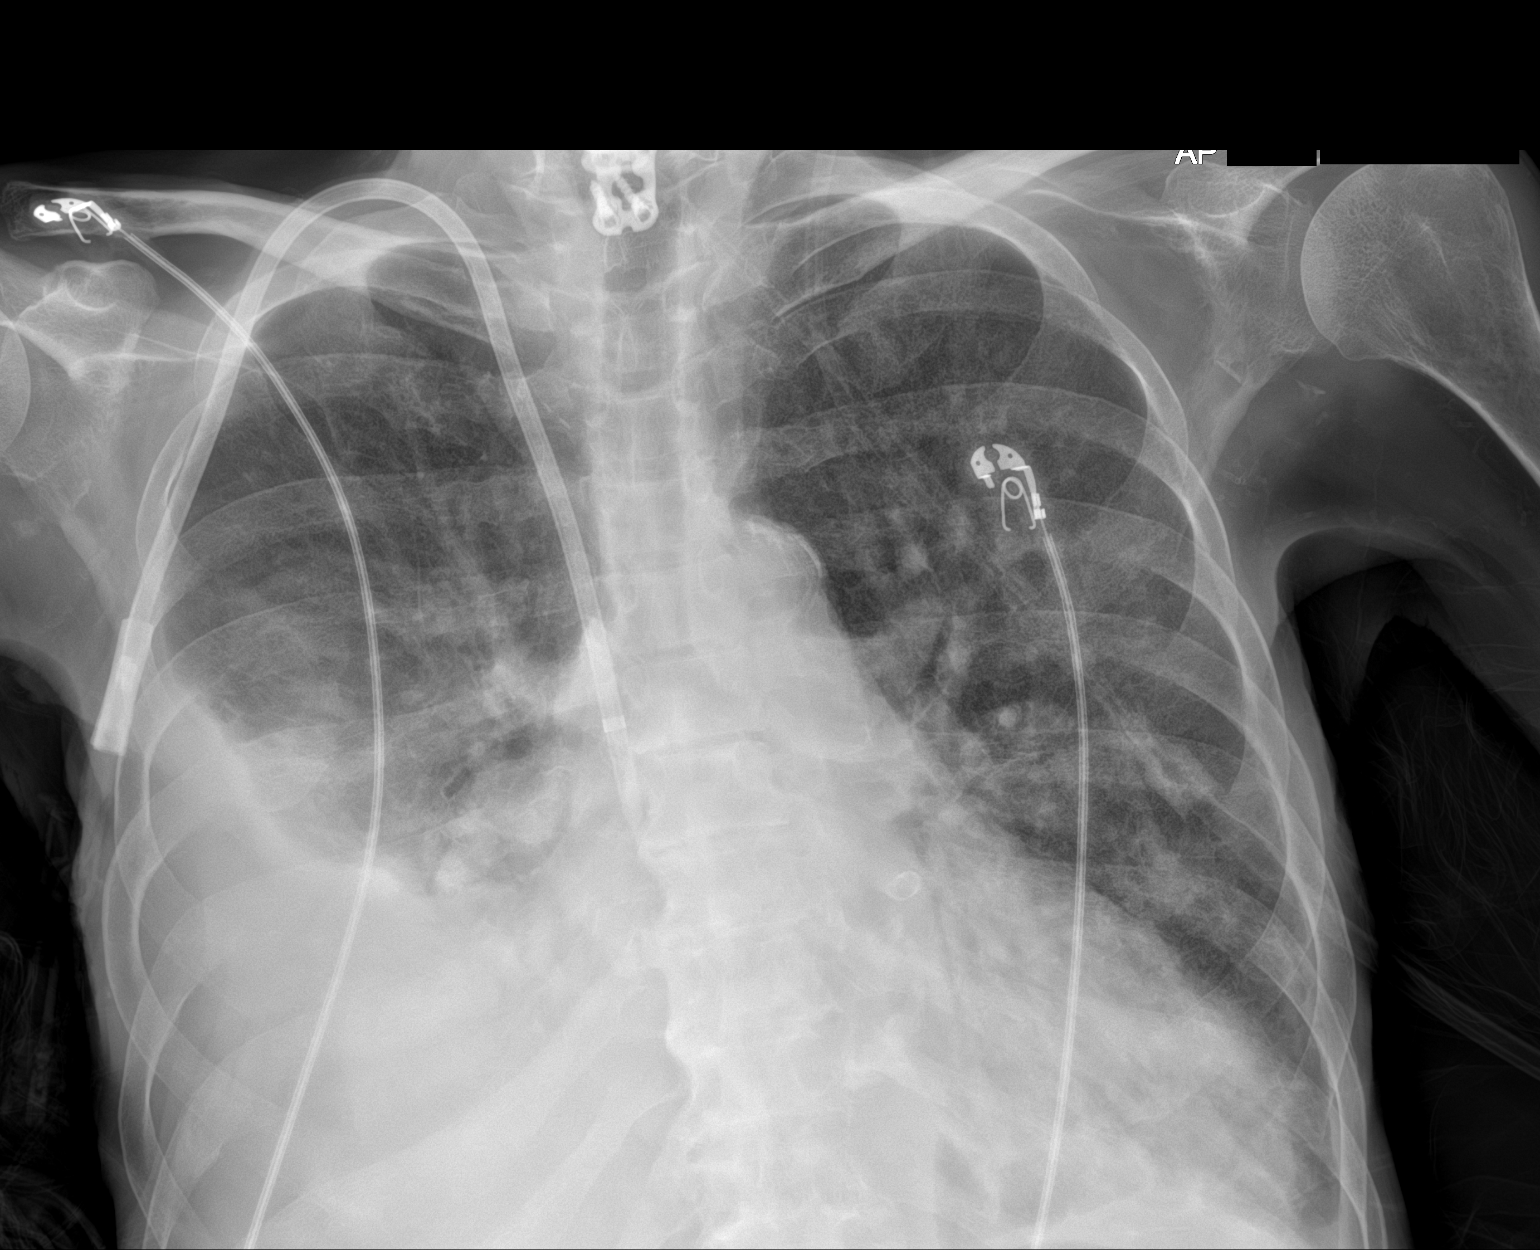

[1 of 1 positions shown; findings below may reference images not displayed]

FINDINGS: Right upper central venous catheter with tip overlying the right
atrium.

Cardiac silhouette is partially obscured but appears enlarged,
unchanged. Central vascular prominence.

Increased large right pleural effusion with adjacent atelectasis.
Bilateral interstitial alveolar opacities appears similar prior.

The visualized skeletal structures are unchanged. Cervical fusion
hardware.
IMPRESSION: 1. Increased large right pleural effusion with adjacent atelectasis.
2. Persistent bilateral interstitial and alveolar opacities, likely
pulmonary edema.

## 2022-08-09 IMAGING — CT CT ANGIO CHEST
2 of 6 series · 17 of 46 positions shown · IV contrast (agent unspecified)
Comparison: Chest x-ray 11/15/2021.

CLINICAL DATA: Chest pain with shortness of breath. Recent
thoracentesis.

EXAM:
CT ANGIOGRAPHY CHEST WITH CONTRAST
TECHNIQUE: Multidetector CT imaging of the chest was performed using the
standard protocol during bolus administration of intravenous
contrast. Multiplanar CT image reconstructions and MIPs were
obtained to evaluate the vascular anatomy.

[Series 6: thins · axial · 0.69mm/px · z∈[+1138,+1420]mm · 14 of 310 slices shown]
[im 14/310  lung]
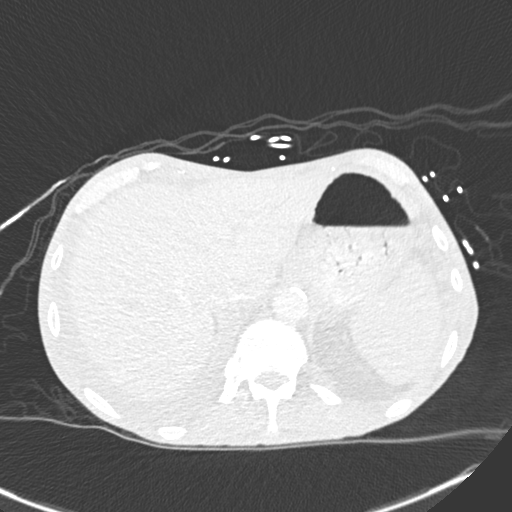
[im 41/310  soft-tissue]
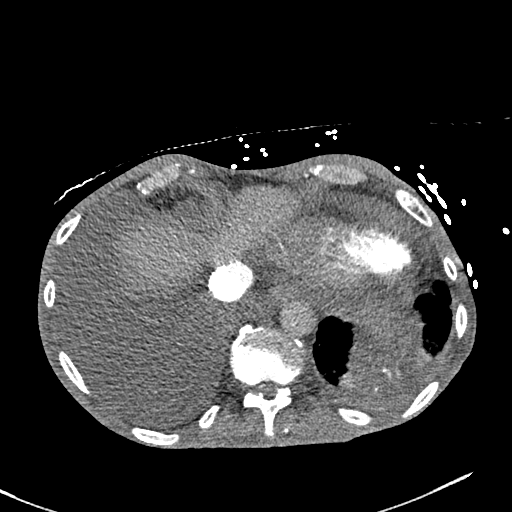
[im 54/310  lung]
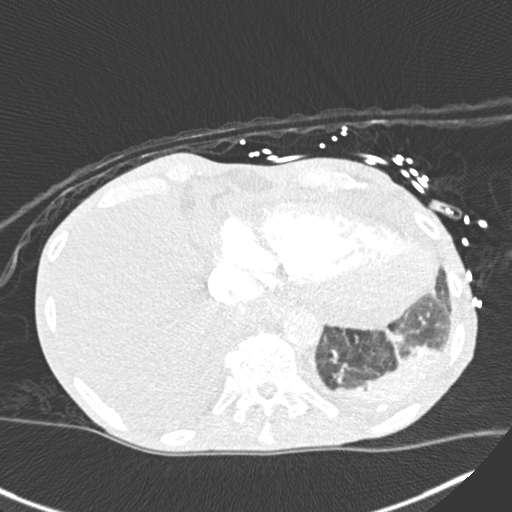
[im 81/310  soft-tissue]
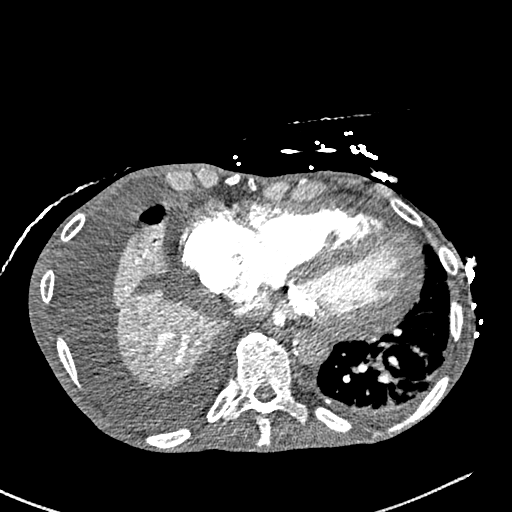
[im 108/310  lung]
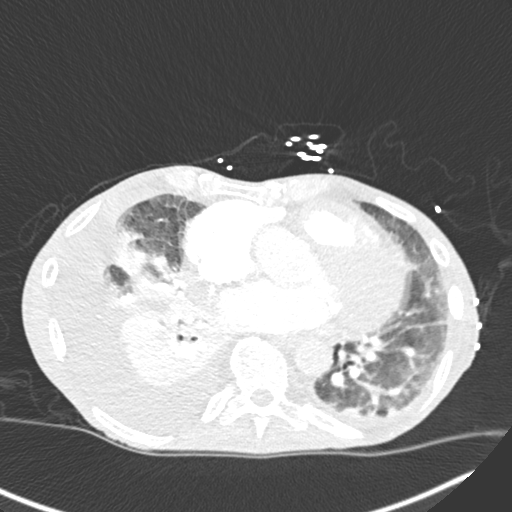
[im 121/310  soft-tissue]
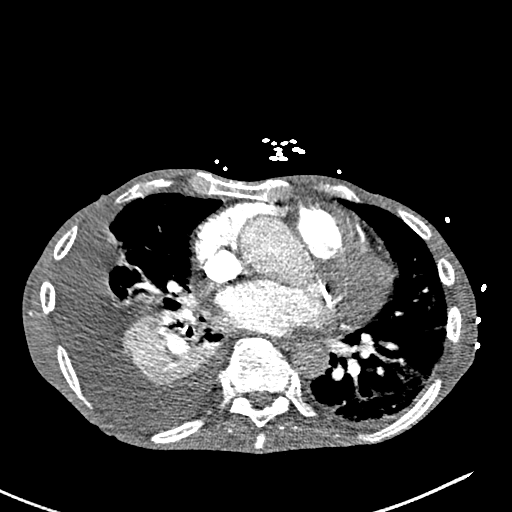
[im 148/310  lung]
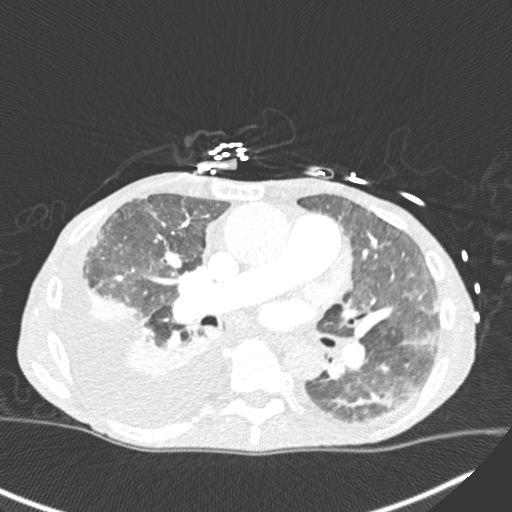
[im 162/310  soft-tissue]
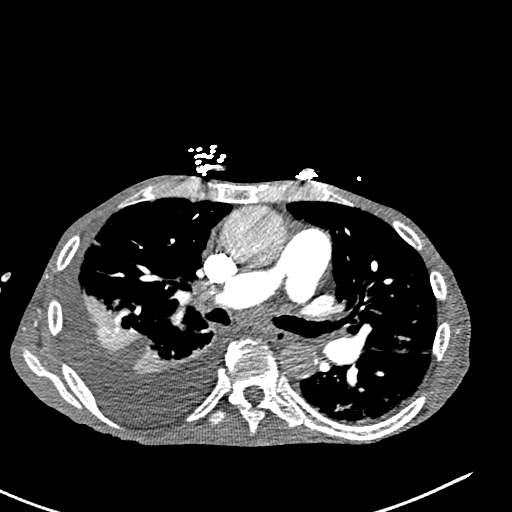
[im 189/310  lung]
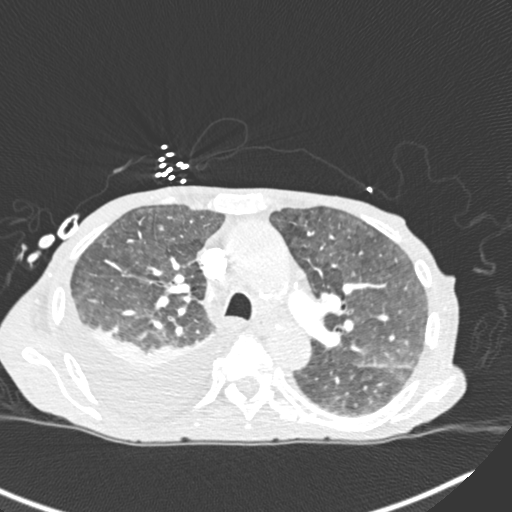
[im 202/310  soft-tissue]
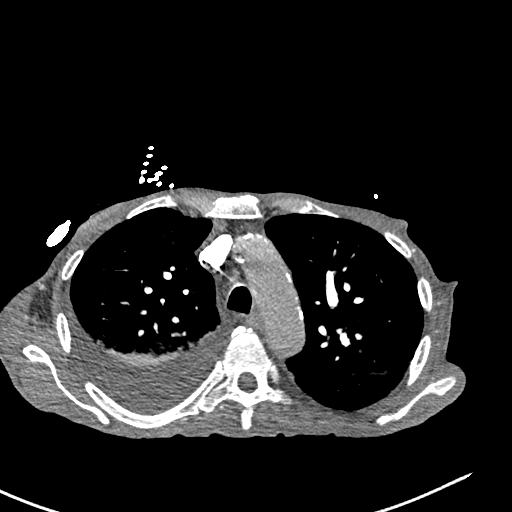
[im 229/310  lung]
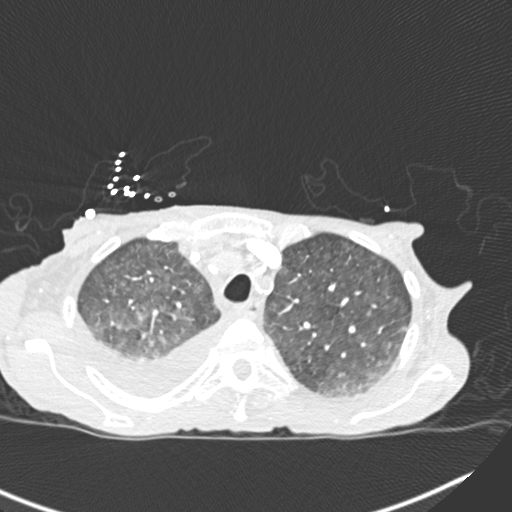
[im 256/310  soft-tissue]
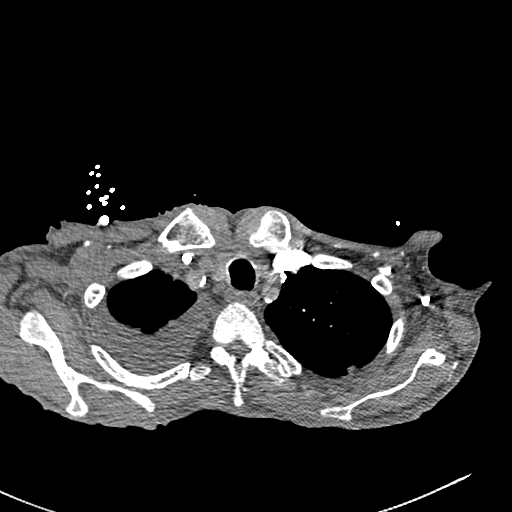
[im 269/310  lung]
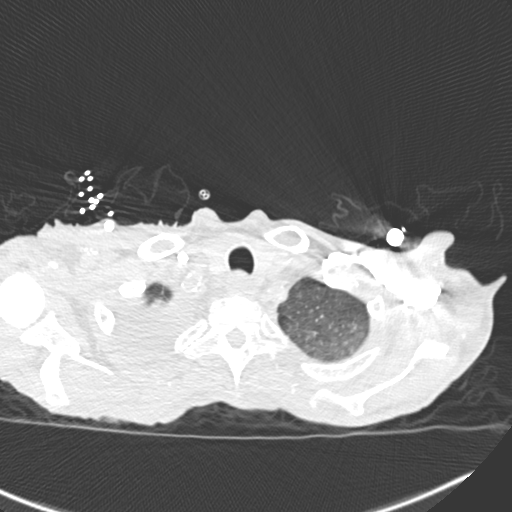
[im 296/310  soft-tissue]
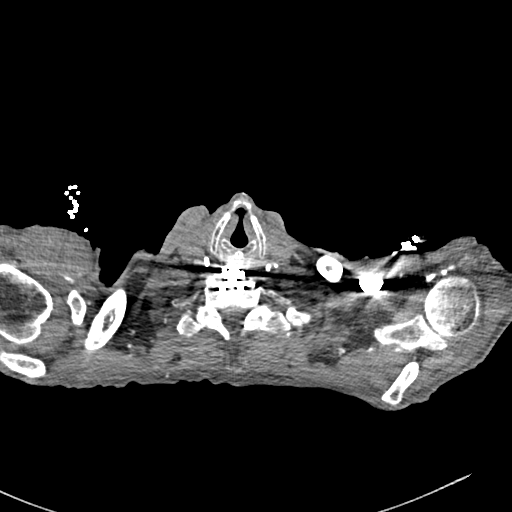

[Series 8: coronal mpr · coronal · 0.60mm/px · 3 of 105 slices shown]
[im 27/105  soft-tissue]
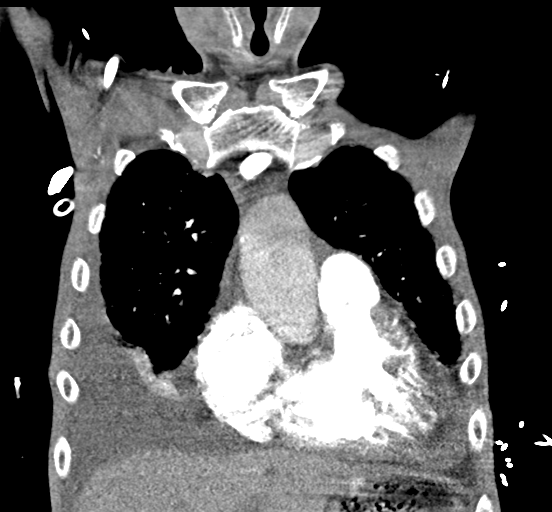
[im 53/105  soft-tissue]
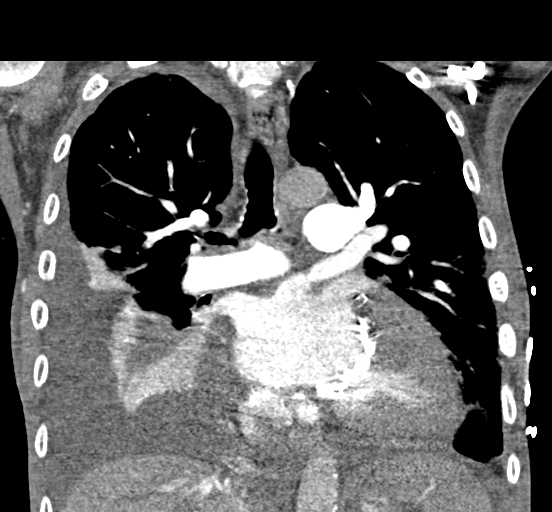
[im 79/105  soft-tissue]
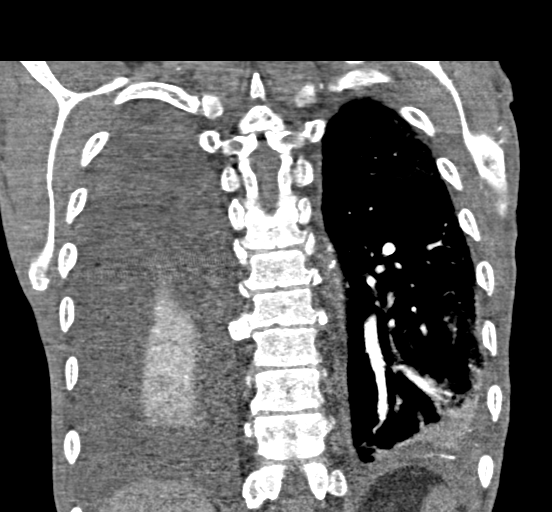

[17 of 46 positions shown; findings below may reference images not displayed]

RADIATION DOSE REDUCTION: This exam was performed according to the
departmental dose-optimization program which includes automated
exposure control, adjustment of the mA and/or kV according to
patient size and/or use of iterative reconstruction technique.

CONTRAST:  75mL OMNIPAQUE IOHEXOL 350 MG/ML SOLN
FINDINGS: Cardiovascular: There is stable aneurysmal dilatation of the
ascending aorta measuring 4.3 cm. Heart is enlarged, unchanged.
There is a small pericardial effusion similar to the prior study.
Right-sided central venous catheter tip ends in the distal SVC.

Opacification of the pulmonary arteries is adequate to the segmental
level. There is no evidence for pulmonary embolism.

Mediastinum/Nodes: Enlarged subcarinal lymph node measuring 14 mm
short axis appears grossly unchanged. No new enlarged lymph nodes
are seen. Visualized thyroid gland and esophagus are within normal
limits.

Lungs/Pleura: Moderate to large sized partially loculated right
pleural effusion has not significantly changed compared to the prior
study. Layering small left pleural effusion has decreased. Diffuse
ground-glass opacities with some smooth interlobular septal
thickening persists. There is consolidation/atelectasis of the right
lower lobe and right middle lobe which appears unchanged from prior.
There is also atelectasis/airspace disease in the left lower lobe
which is unchanged from prior. Trachea and central airways appear
patent. No pneumothorax visualized.

Upper Abdomen: No acute abnormality.

Musculoskeletal: Left 6, seventh and eighth nondisplaced rib
fractures are again noted. No new acute fractures identified.
Cervical spinal fusion plate is present.

Review of the MIP images confirms the above findings.
IMPRESSION: 1. No evidence for pulmonary embolism.
2. Unchanged appearance of moderate to large sized loculated right
pleural effusion.
3. Decreasing small left pleural effusion.
4. Stable diffuse ground-glass opacities with smooth interlobular
septal thickening favored as pulmonary edema. Infection not
excluded.
5. Stable right middle lobe and right lower lobe compressive
atelectasis/consolidation.
6. Stable left lower lobe atelectasis/airspace disease.
7. Stable cardiomegaly with small pericardial effusion.
8. Stable left-sided rib fractures.
9. Stable aneurysmal dilatation of the ascending aorta measuring
cm. Recommend annual imaging followup by CTA or MRA. This
recommendation follows 2404
ACCF/AHA/AATS/ACR/ASA/SCA/YOEL/TIGER/ANTARTIS/QUIRIJN Guidelines for the
Diagnosis and Management of Patients with Thoracic Aortic Disease.
Circulation. 2404; 121: [id number]. Aortic aneurysm NOS ([id number])

## 2022-08-09 IMAGING — DX DG CHEST 1V PORT
1 series · 1 of 1 positions shown · non-contrast
Comparison: 11/21/2021

CLINICAL DATA: Chest pain

EXAM:
PORTABLE CHEST 1 VIEW

[chest]
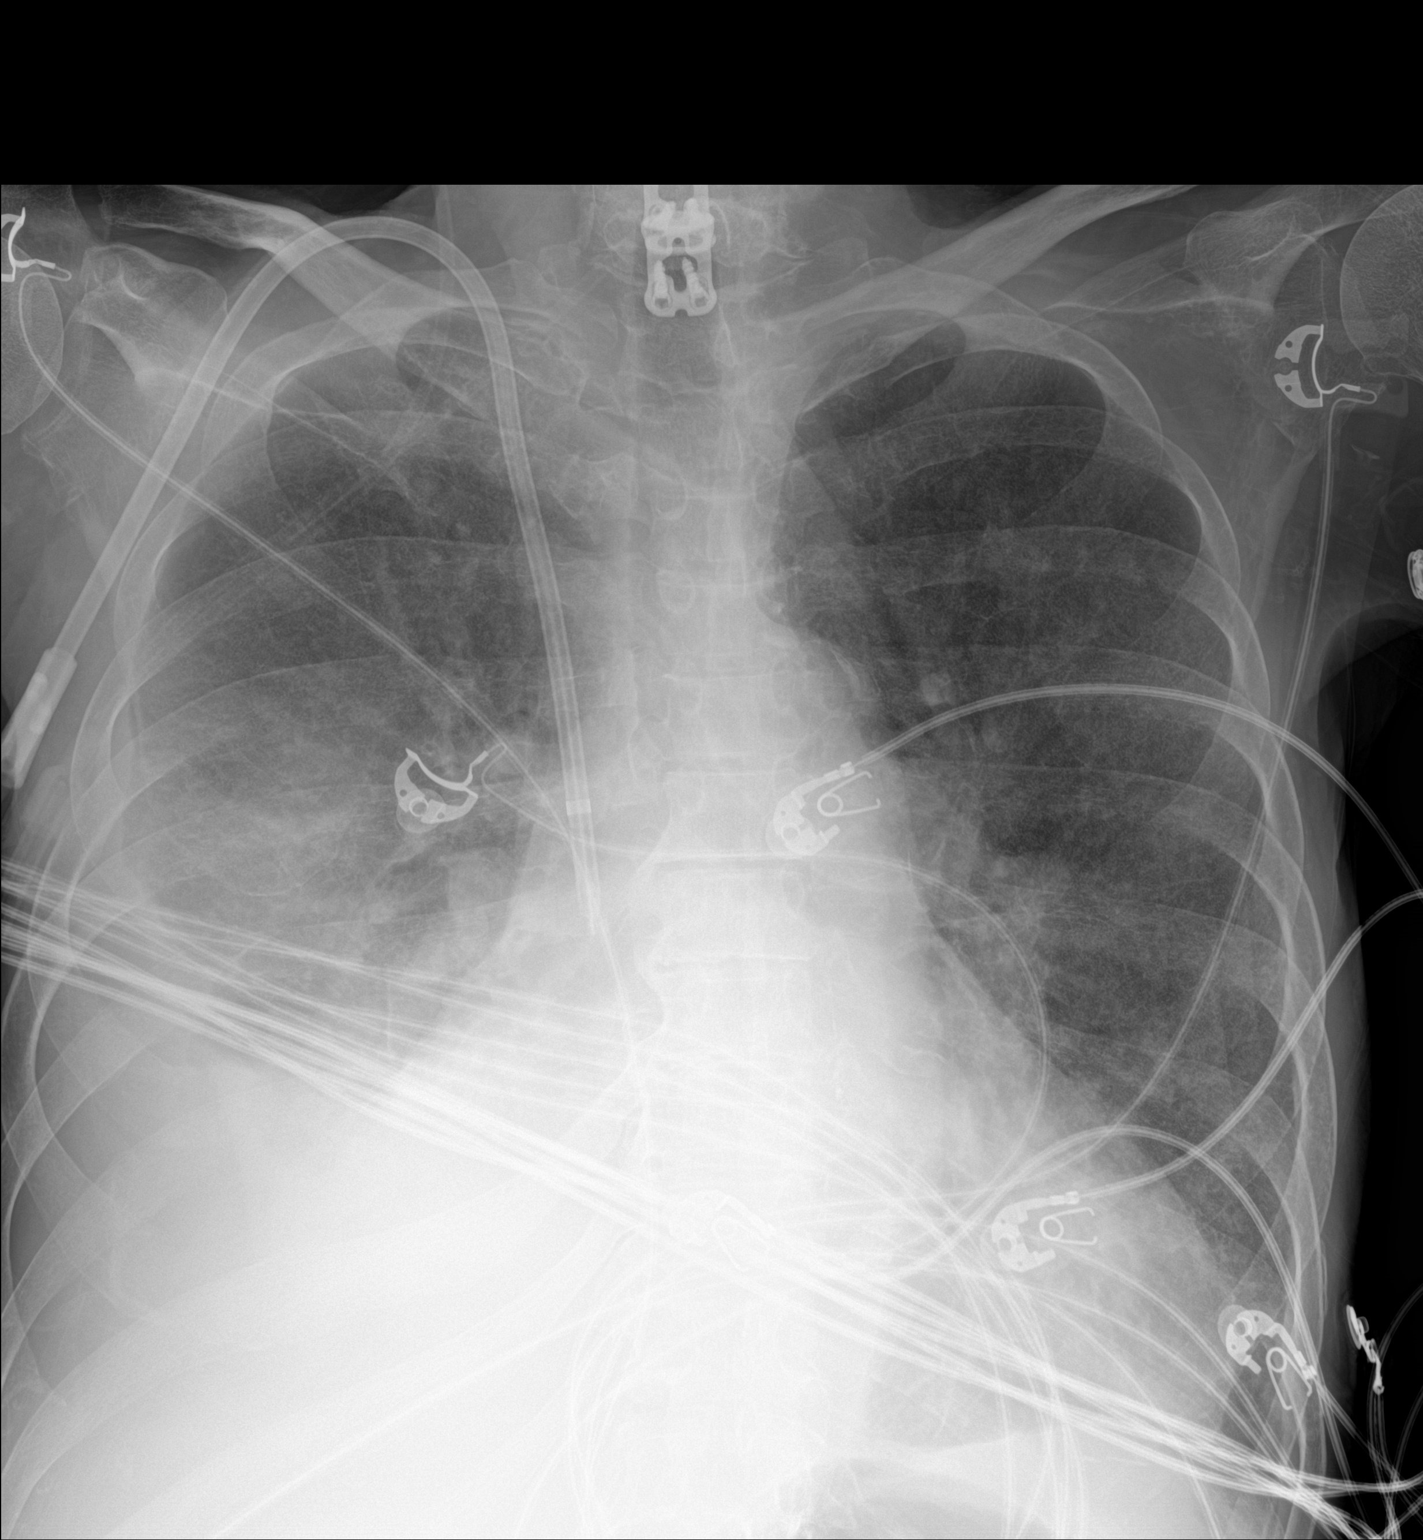

[1 of 1 positions shown; findings below may reference images not displayed]

FINDINGS: Moderate right pleural effusion and perihilar interstitial and
alveolar pulmonary infiltrate most in keeping with perihilar
pulmonary edema is unchanged, likely cardiogenic in nature. No
pneumothorax. No pleural effusion on the left. Cardiomegaly is
stable. Right internal jugular hemodialysis catheter tip seen at the
superior vena cava. No acute bone abnormality.
IMPRESSION: Stable changes of mild cardiogenic failure with mild perihilar
pulmonary edema, moderate right pleural effusion, and cardiomegaly.

## 2022-08-10 IMAGING — DX DG CHEST 1V
1 series · 1 of 1 positions shown · non-contrast
Comparison: Previous studies including the examination of
12/04/2021

CLINICAL DATA: Status post thoracentesis

EXAM:
CHEST  1 VIEW

[chest ap]
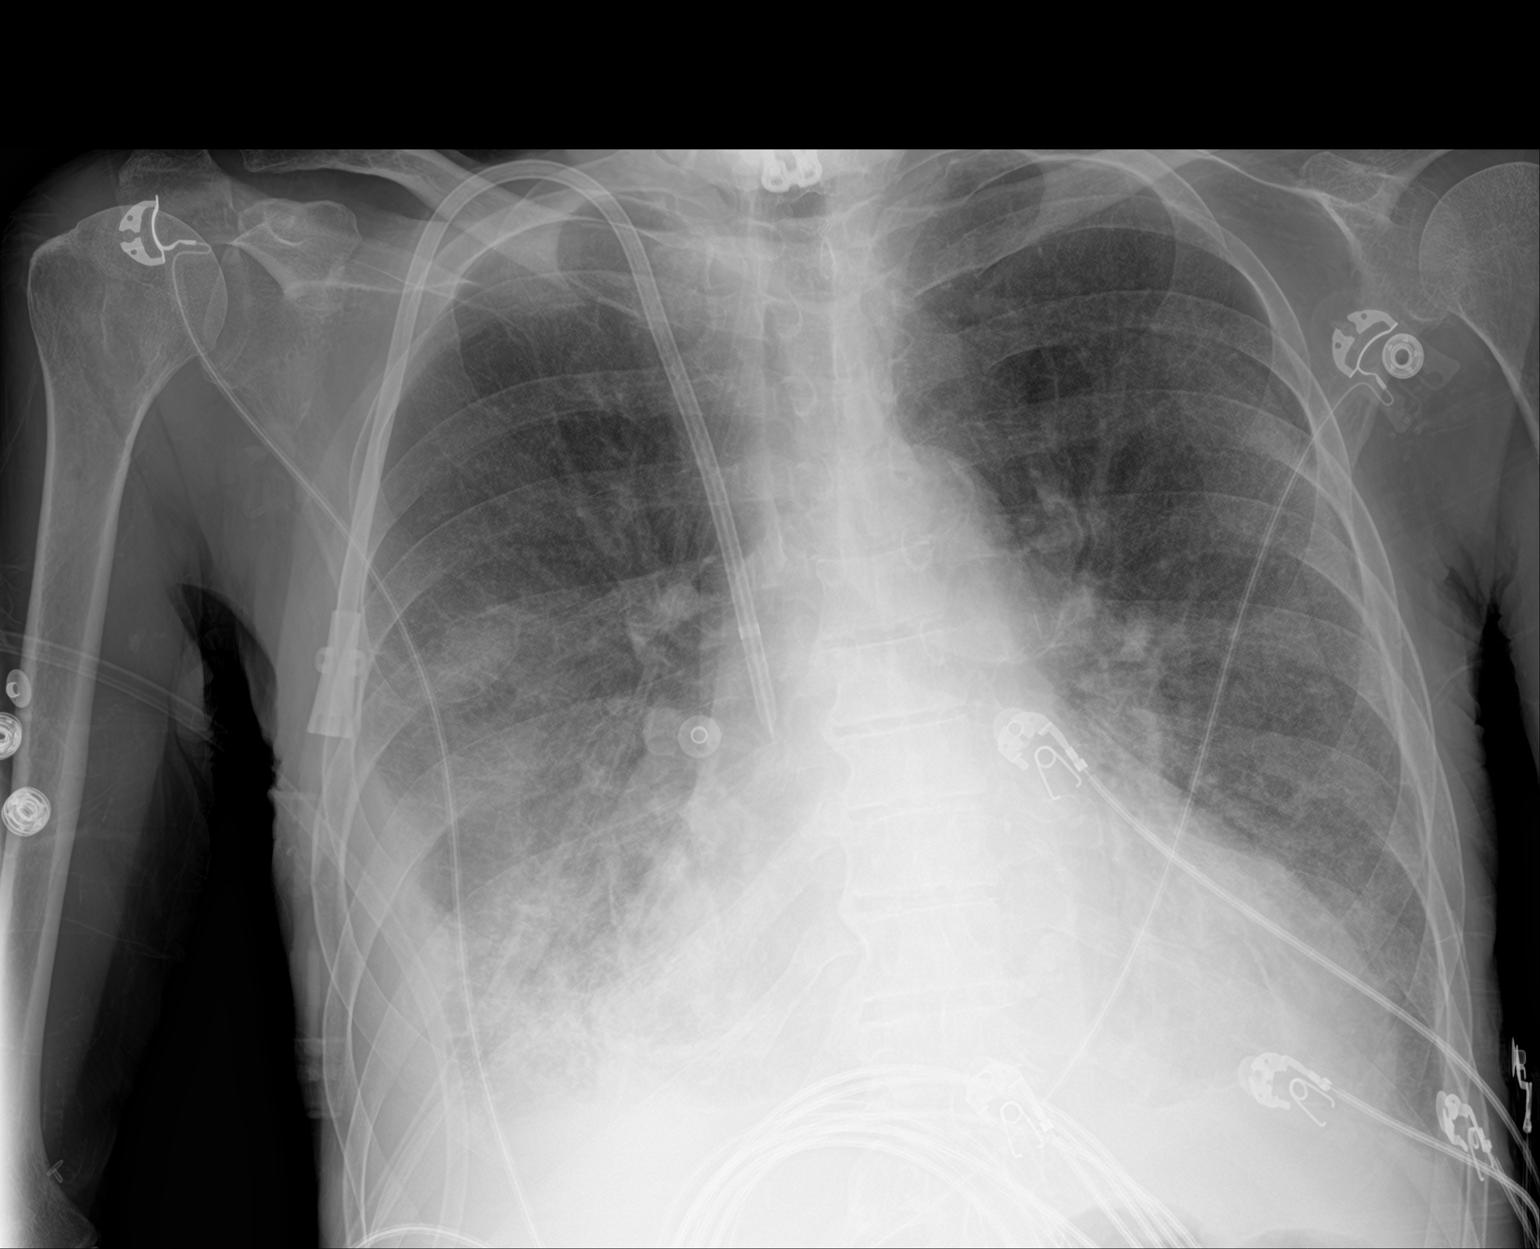

[1 of 1 positions shown; findings below may reference images not displayed]

FINDINGS: There is interval decrease in right pleural effusion. There is no
pneumothorax. Transverse diameter of heart is increased. Central
pulmonary vessels are prominent. Increased interstitial markings are
seen in the parahilar regions and lower lung fields on both sides
suggesting pulmonary edema. Patchy infiltrates are seen in the lower
lung fields, more so on the right side suggesting
atelectasis/pneumonia. There is minimal blunting of left lateral CP
angle. Tip of right IJ dialysis catheter is seen in the superior
vena cava. There is previous surgical fusion in the lower cervical
spine.
IMPRESSION: There is decrease in right pleural effusion. Still, there is small
to moderate amount of residual right pleural effusion. There is no
pneumothorax.

Cardiomegaly. CHF. Infiltrates are seen in the lower lung fields,
more so on the right side suggesting atelectasis/pneumonia.

## 2022-08-10 IMAGING — US IR THORACENTESIS ASP PLEURAL SPACE W/IMG GUIDE
1 series · 3 of 3 positions shown · non-contrast
Comparison: none

INDICATION: History of ESRD on HD, chronic diastolic CHF and chronic respiratory
failure with chronic pleural effusions. Request for right-sided
therapeutic thoracentesis.

[Series 1: ir thoracentesis asp pleural space w/img guide · 3 of 3 slices shown]
[im 1/3]
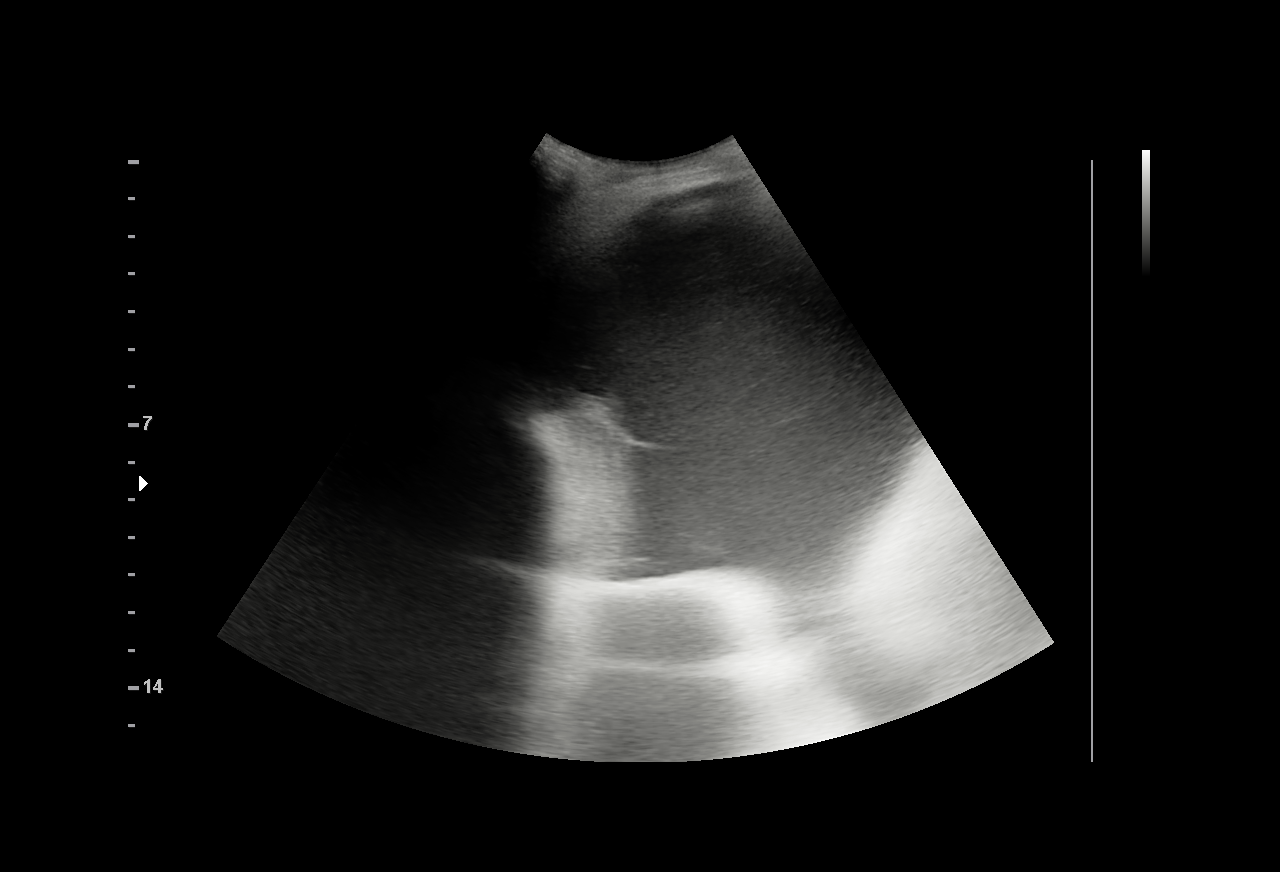
[im 2/3]
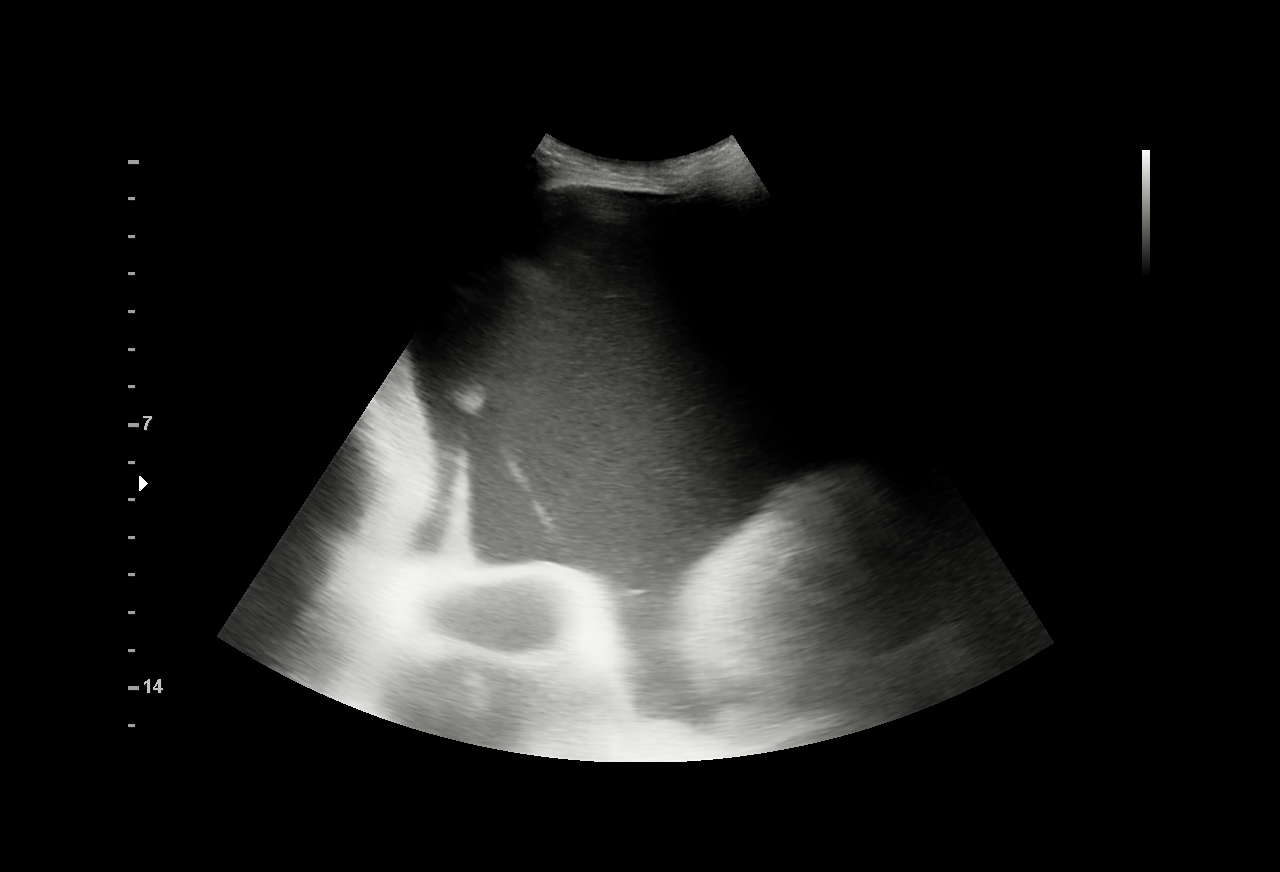
[im 3/3]
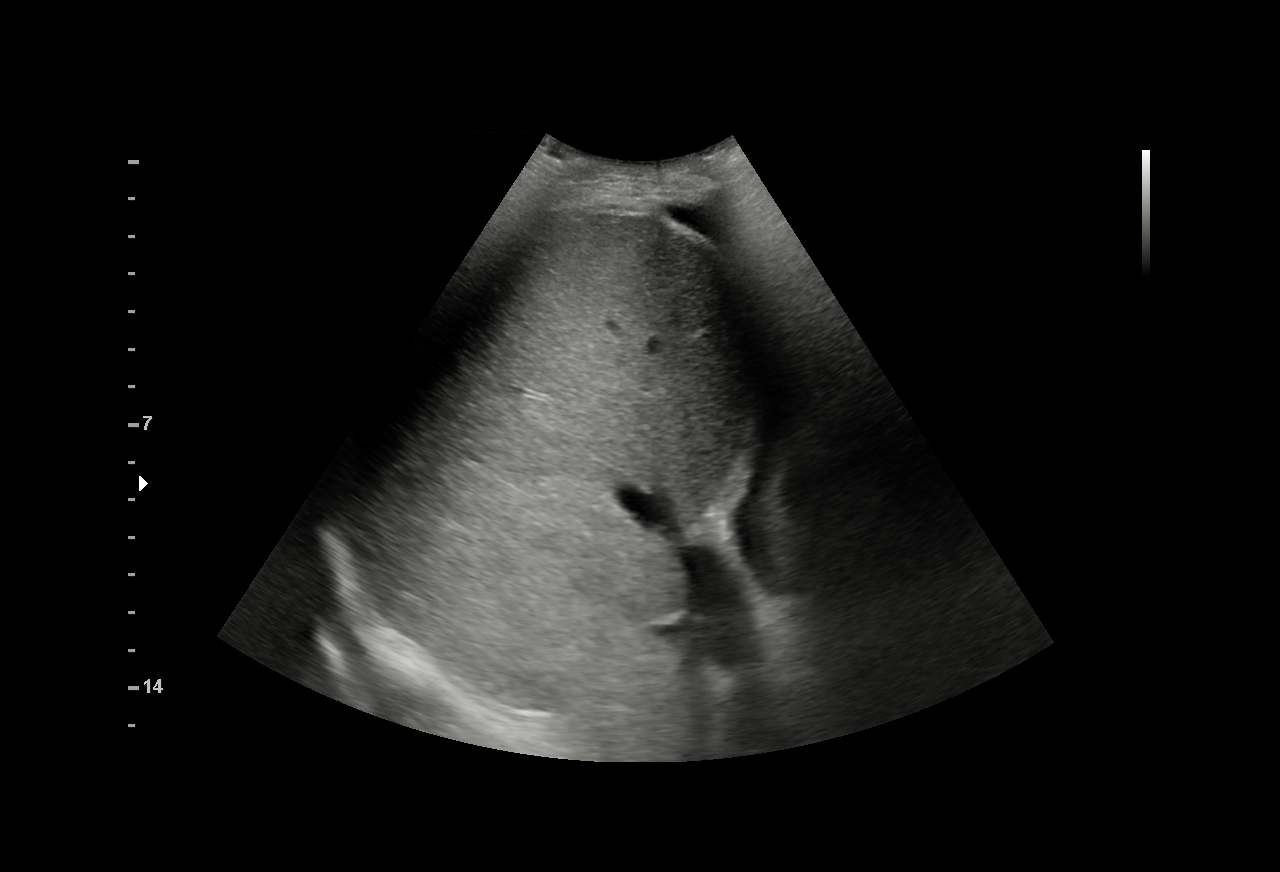

[3 of 3 positions shown; findings below may reference images not displayed]

EXAM:
ULTRASOUND GUIDED THERAPEUTIC RIGHT THORACENTESIS

MEDICATIONS:
10 mL 1 % lidocaine

COMPLICATIONS:
None immediate.

PROCEDURE:
An ultrasound guided thoracentesis was thoroughly discussed with the
patient and questions answered. The benefits, risks, alternatives
and complications were also discussed. The patient understands and
wishes to proceed with the procedure. Written consent was obtained.

Ultrasound was performed to localize and mark an adequate pocket of
fluid in the right chest. The area was then prepped and draped in
the normal sterile fashion. 1% Lidocaine was used for local
anesthesia. Under ultrasound guidance a 6 Fr Safe-T-Centesis
catheter was introduced. Thoracentesis was performed. The catheter
was removed and a dressing applied.
FINDINGS: A total of approximately 1.1 L of serosanguineous fluid was removed.
IMPRESSION: Successful ultrasound guided right thoracentesis yielding 1.1 L of
pleural fluid.

## 2022-08-19 IMAGING — DX DG CHEST 1V PORT
1 series · 1 of 1 positions shown · non-contrast
Comparison: 12/05/2021

CLINICAL DATA: Chest pain since dialysis yesterday, short of breath

EXAM:
PORTABLE CHEST 1 VIEW

[chest ap]
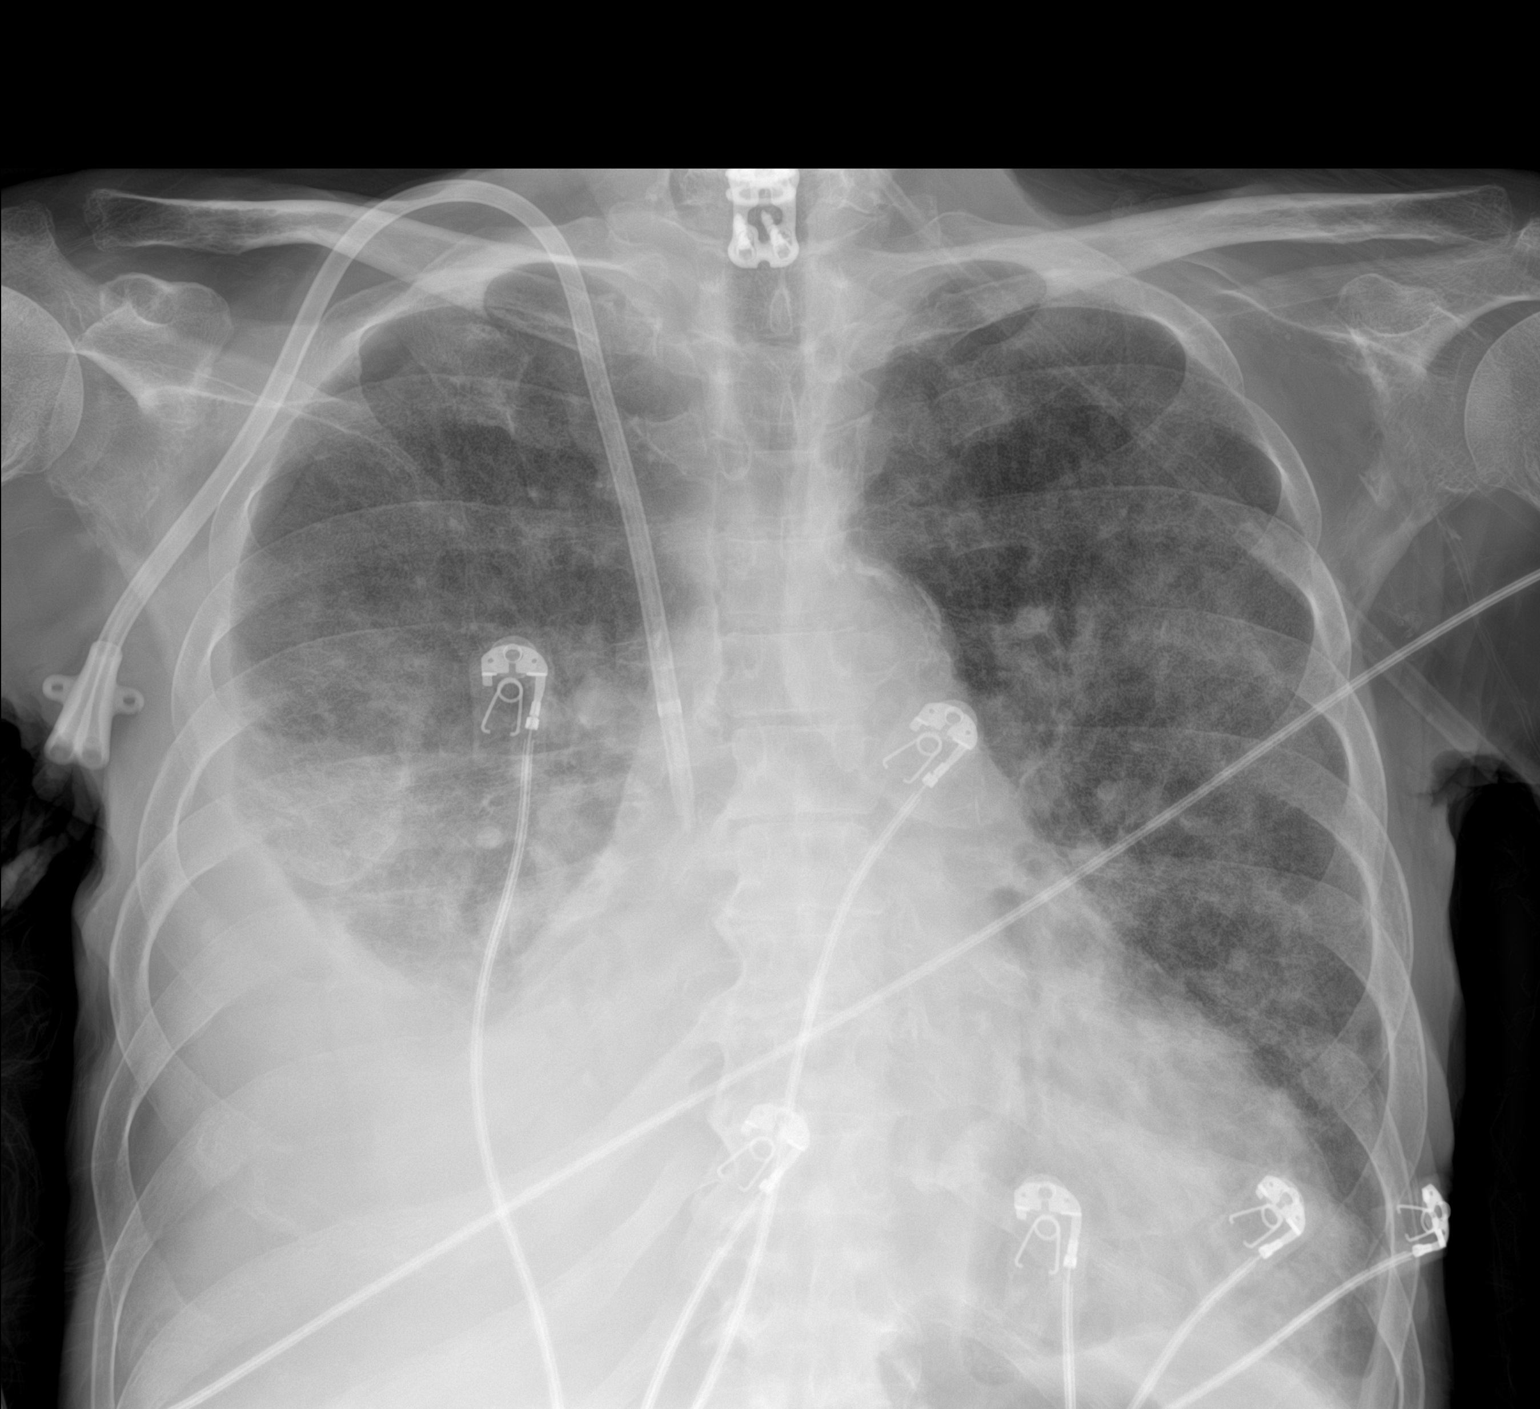

[1 of 1 positions shown; findings below may reference images not displayed]

FINDINGS: Single frontal view of the chest demonstrates stable right internal
jugular dialysis catheter. Widespread bilateral interstitial and
ground-glass opacities are again noted, with increasing right
pleural effusion. No pneumothorax. Cardiac silhouette remains
enlarged. No acute bony abnormalities.
IMPRESSION: 1. Findings consistent with worsening pulmonary edema and enlarging
right pleural effusion.

## 2022-08-20 IMAGING — DX DG CHEST 1V
1 series · 1 of 1 positions shown · non-contrast
Comparison: Portable chest 12/14/2021 and earlier.

CLINICAL DATA: 60-year-old male status post ultrasound-guided right
side thoracentesis this morning.

EXAM:
CHEST  1 VIEW

[chest ap]
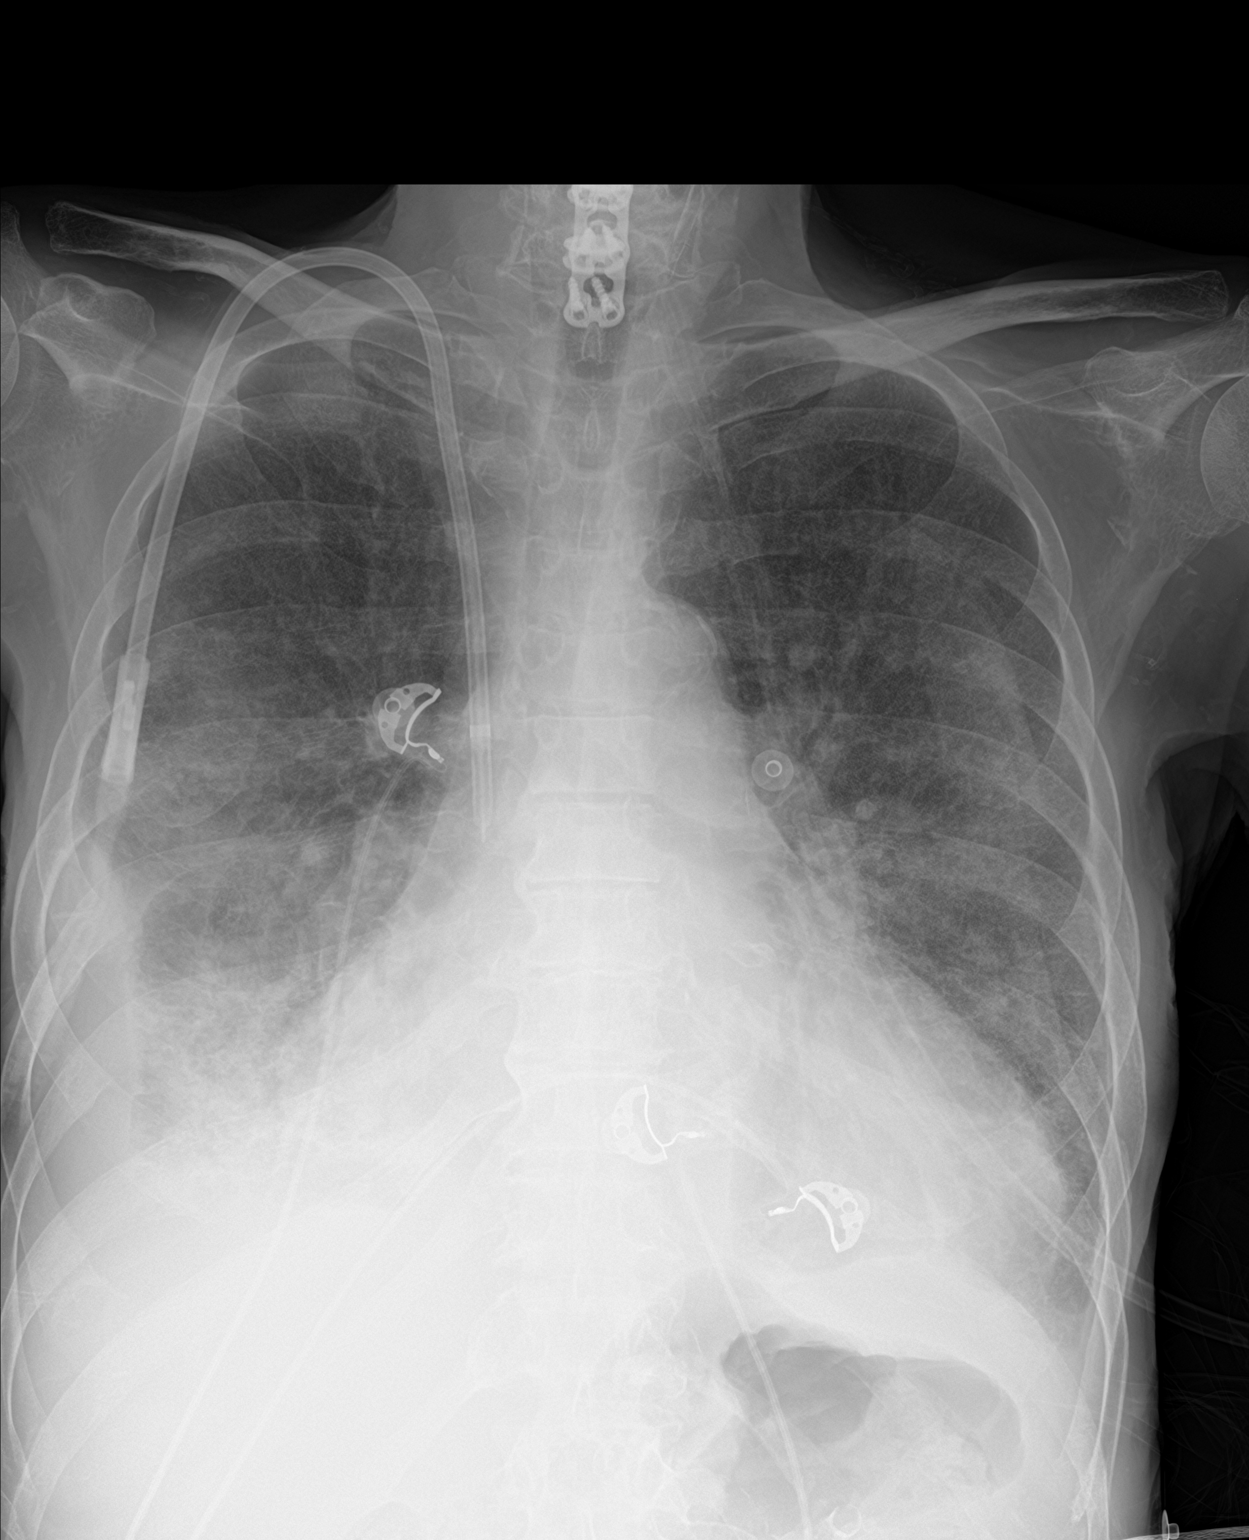

[1 of 1 positions shown; findings below may reference images not displayed]

FINDINGS: Upright AP view at 1544 hours. No pneumothorax. Volume of right
pleural effusion appears reduced following thoracentesis. Small to
moderate residual. Stable cardiomegaly and mediastinal contours.
Stable right chest dual lumen dialysis type catheter. Ongoing
diffuse pulmonary interstitial opacity not significantly changed
from earlier this month and probably interstitial edema. No areas of
worsening ventilation. No acute osseous abnormality identified.
Negative visible bowel gas.
IMPRESSION: 1. Decreased right pleural effusion and no pneumothorax following
right side thoracentesis.
2. Stable cardiomegaly and diffuse interstitial opacity favored due
to pulmonary edema.

## 2022-08-20 IMAGING — US US THORACENTESIS ASP PLEURAL SPACE W/IMG GUIDE
1 series · 8 of 8 positions shown · non-contrast
Comparison: none

INDICATION: Right pleural effusion

[Series 1: us thoracentesis asp pleural space w/img guide · 8 of 8 slices shown]
[im 1/8]
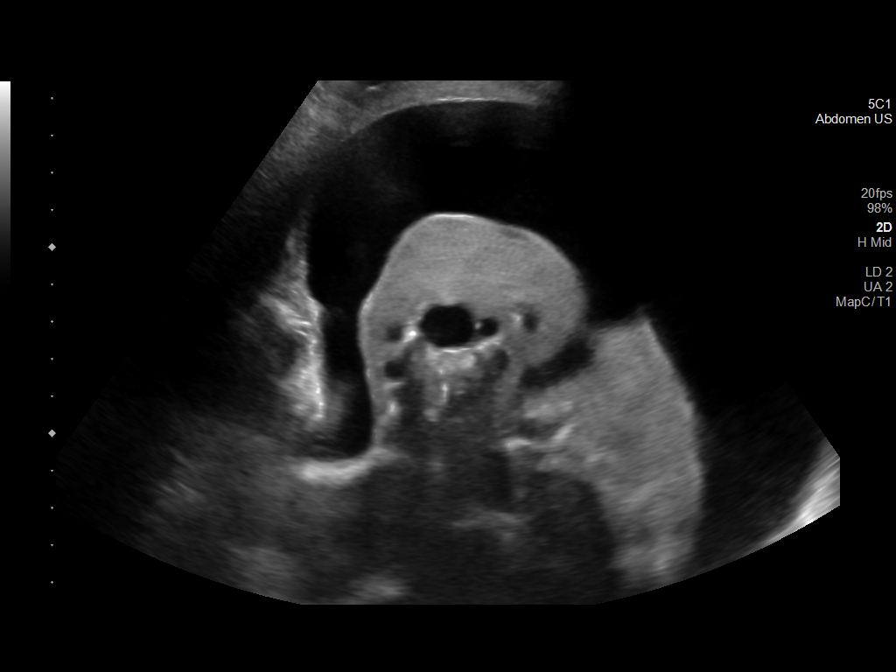
[im 2/8]
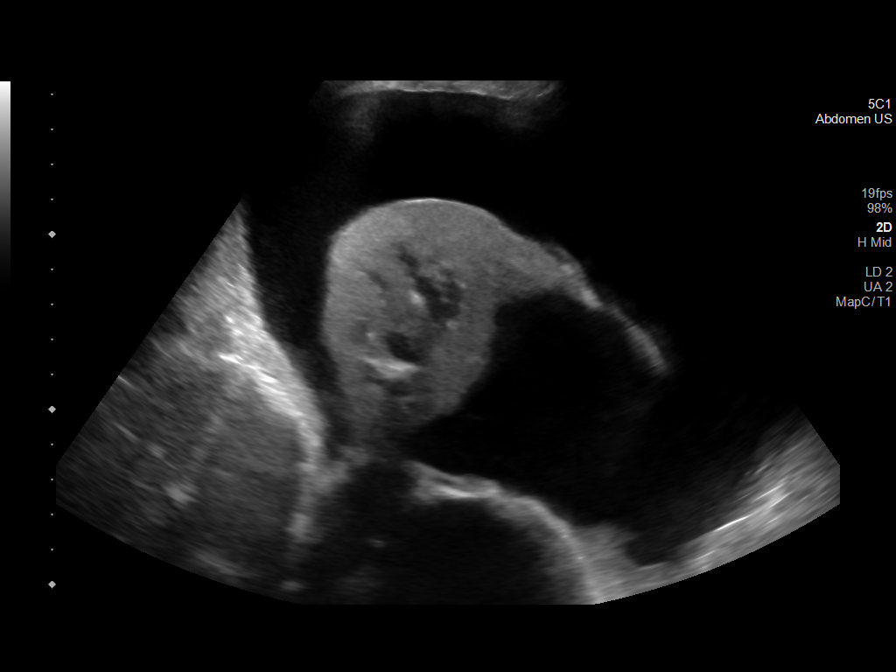
[im 3/8]
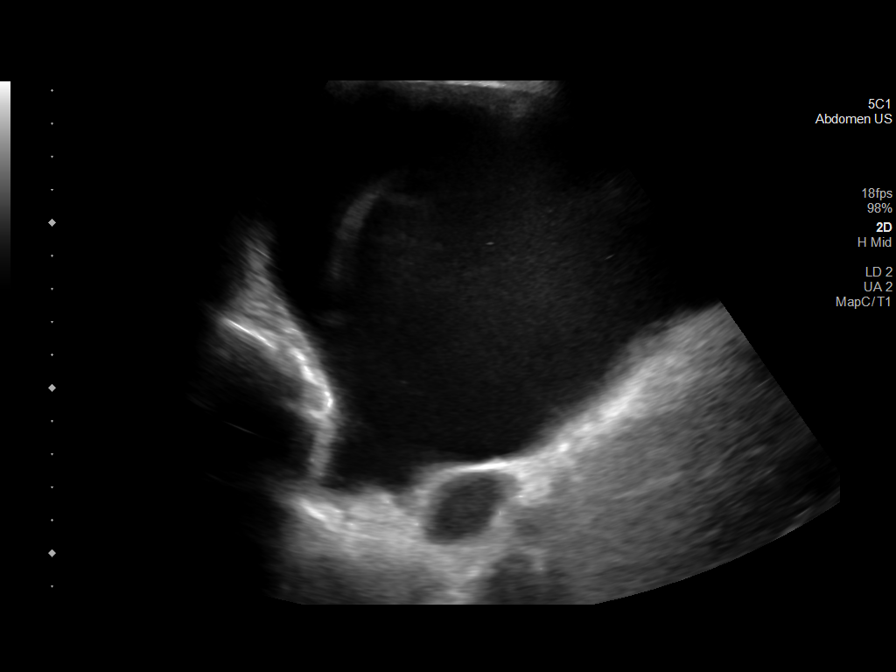
[im 4/8]
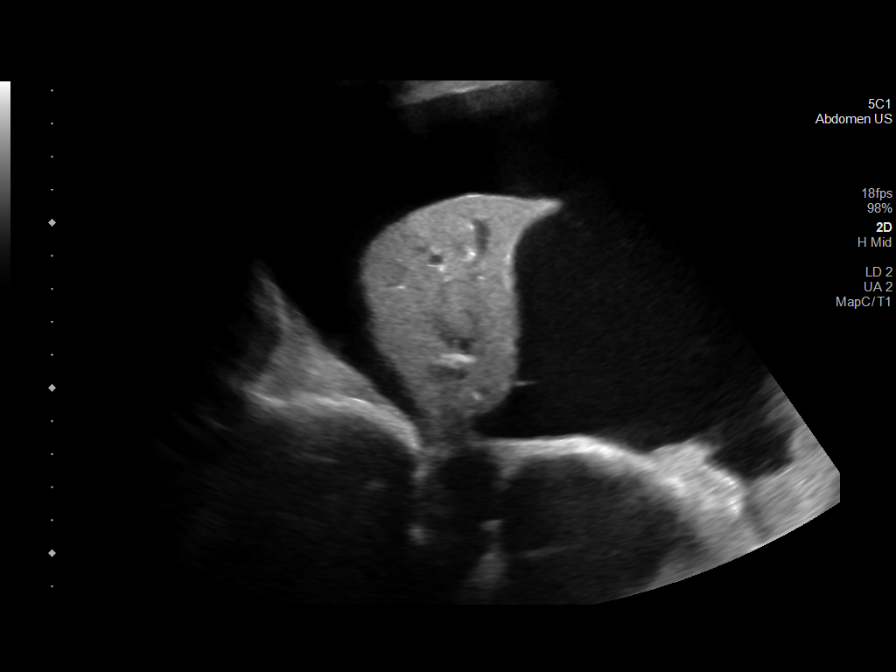
[im 5/8]
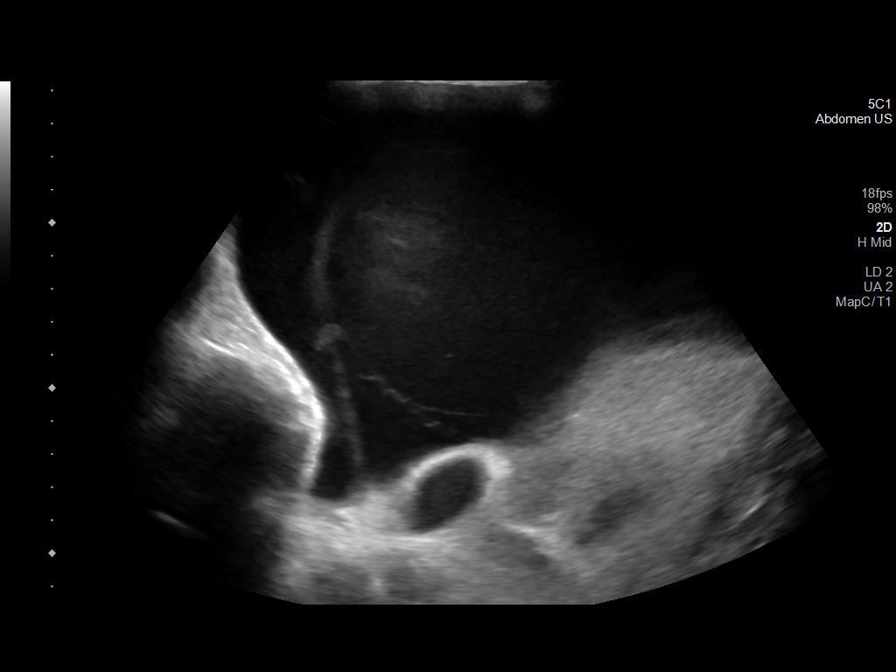
[im 6/8]
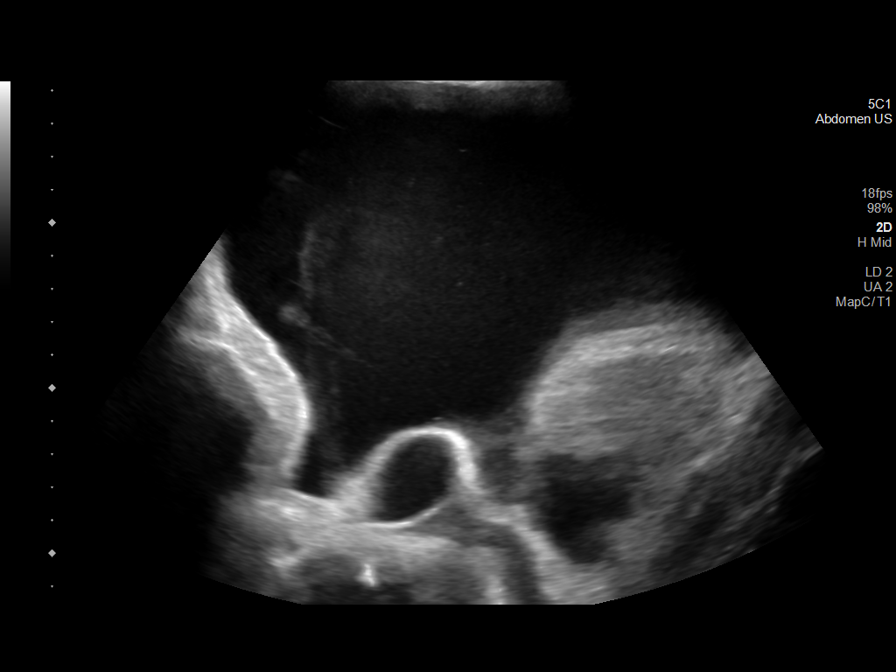
[im 7/8]
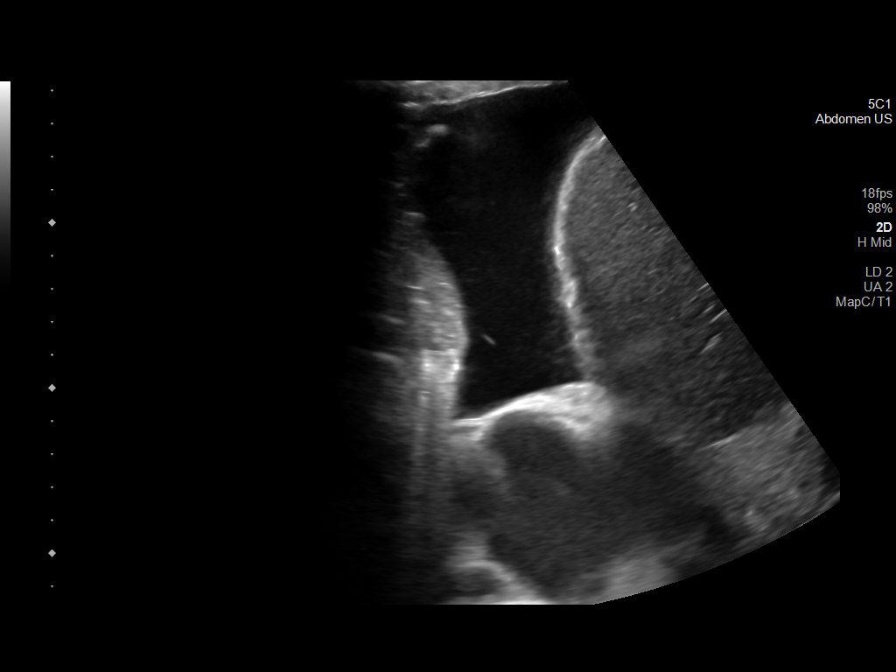
[im 8/8]
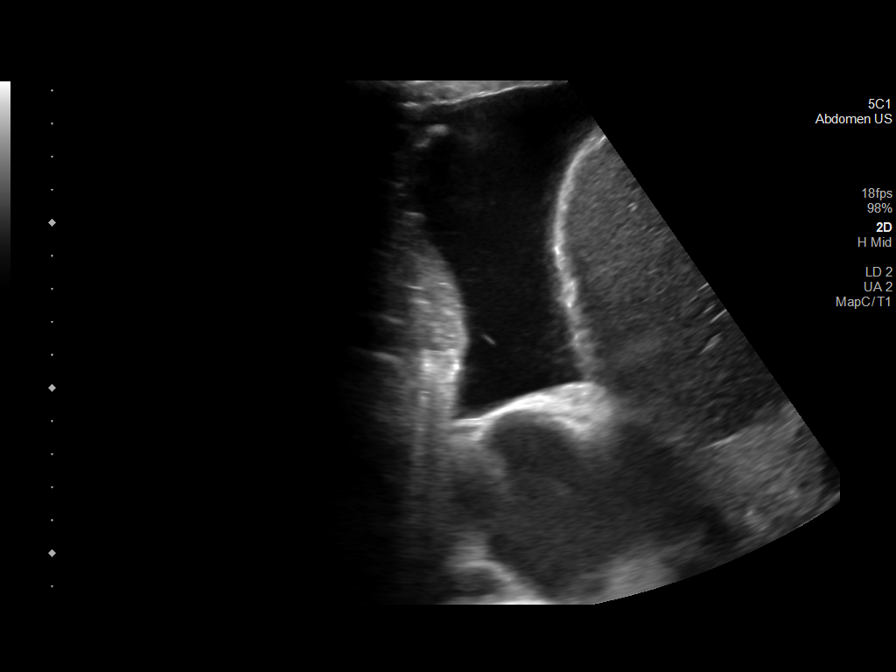

[8 of 8 positions shown; findings below may reference images not displayed]

EXAM:
ULTRASOUND GUIDED RIGHT THORACENTESIS

MEDICATIONS:
10 cc 1% lidocaine

COMPLICATIONS:
None immediate.

PROCEDURE:
An ultrasound guided thoracentesis was thoroughly discussed with the
patient and questions answered. The benefits, risks, alternatives
and complications were also discussed. The patient understands and
wishes to proceed with the procedure. Written consent was obtained.

Ultrasound was performed to localize and mark an adequate pocket of
fluid in the right chest. The area was then prepped and draped in
the normal sterile fashion. 1% Lidocaine was used for local
anesthesia. Under ultrasound guidance a Yueh catheter was
introduced. Thoracentesis was performed. The catheter was removed
and a dressing applied.
FINDINGS: A total of approximately 1155 cc of amber fluid was removed. Samples
were sent to the laboratory as requested by the clinical team.
IMPRESSION: Successful ultrasound guided R thoracentesis yielding 1155 cc of
pleural fluid.

Read by

Zoti Jaki

## 2022-09-14 IMAGING — DX DG CHEST 1V PORT
1 series · 1 of 1 positions shown · non-contrast
Comparison: December 15, 2021

CLINICAL DATA: sob, chest pain starting yesterday after dialysis,
dizziness

EXAM:
PORTABLE CHEST 1 VIEW

[chest]
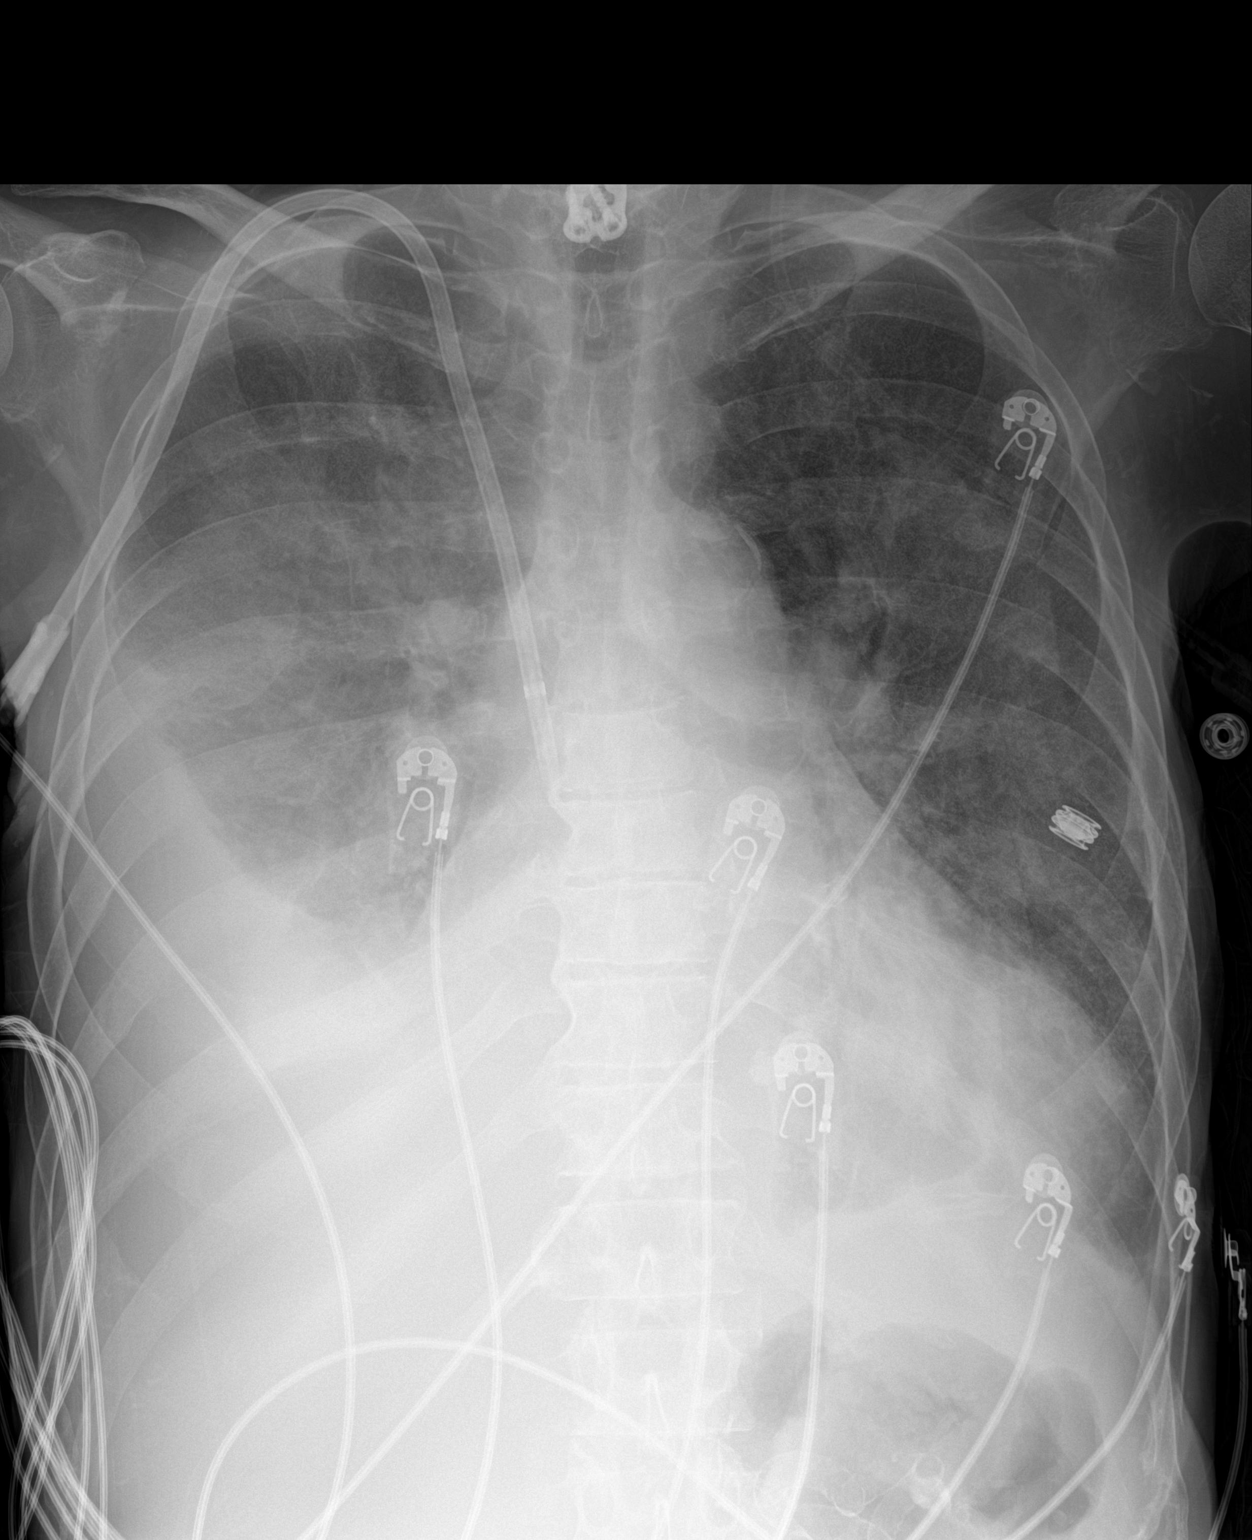

[1 of 1 positions shown; findings below may reference images not displayed]

FINDINGS: Again seen is the right transjugular tunneled dialysis catheter.
Cardiomegaly. Moderately large right pleural effusion and has
worsened in the interim. Pulmonary vascular congestion appears
overall stable. Bibasilar atelectasis. The visualized skeletal
structures are unremarkable.
IMPRESSION: Moderately large right pleural effusion and has increased in the
interim. Pulmonary vascular congestion appears stable. Cardiomegaly.

## 2022-09-15 IMAGING — DX DG CHEST 1V PORT
1 series · 1 of 1 positions shown · non-contrast
Comparison: 01/09/2022

CLINICAL DATA: Shortness of breath

EXAM:
PORTABLE CHEST 1 VIEW

[chest ap]
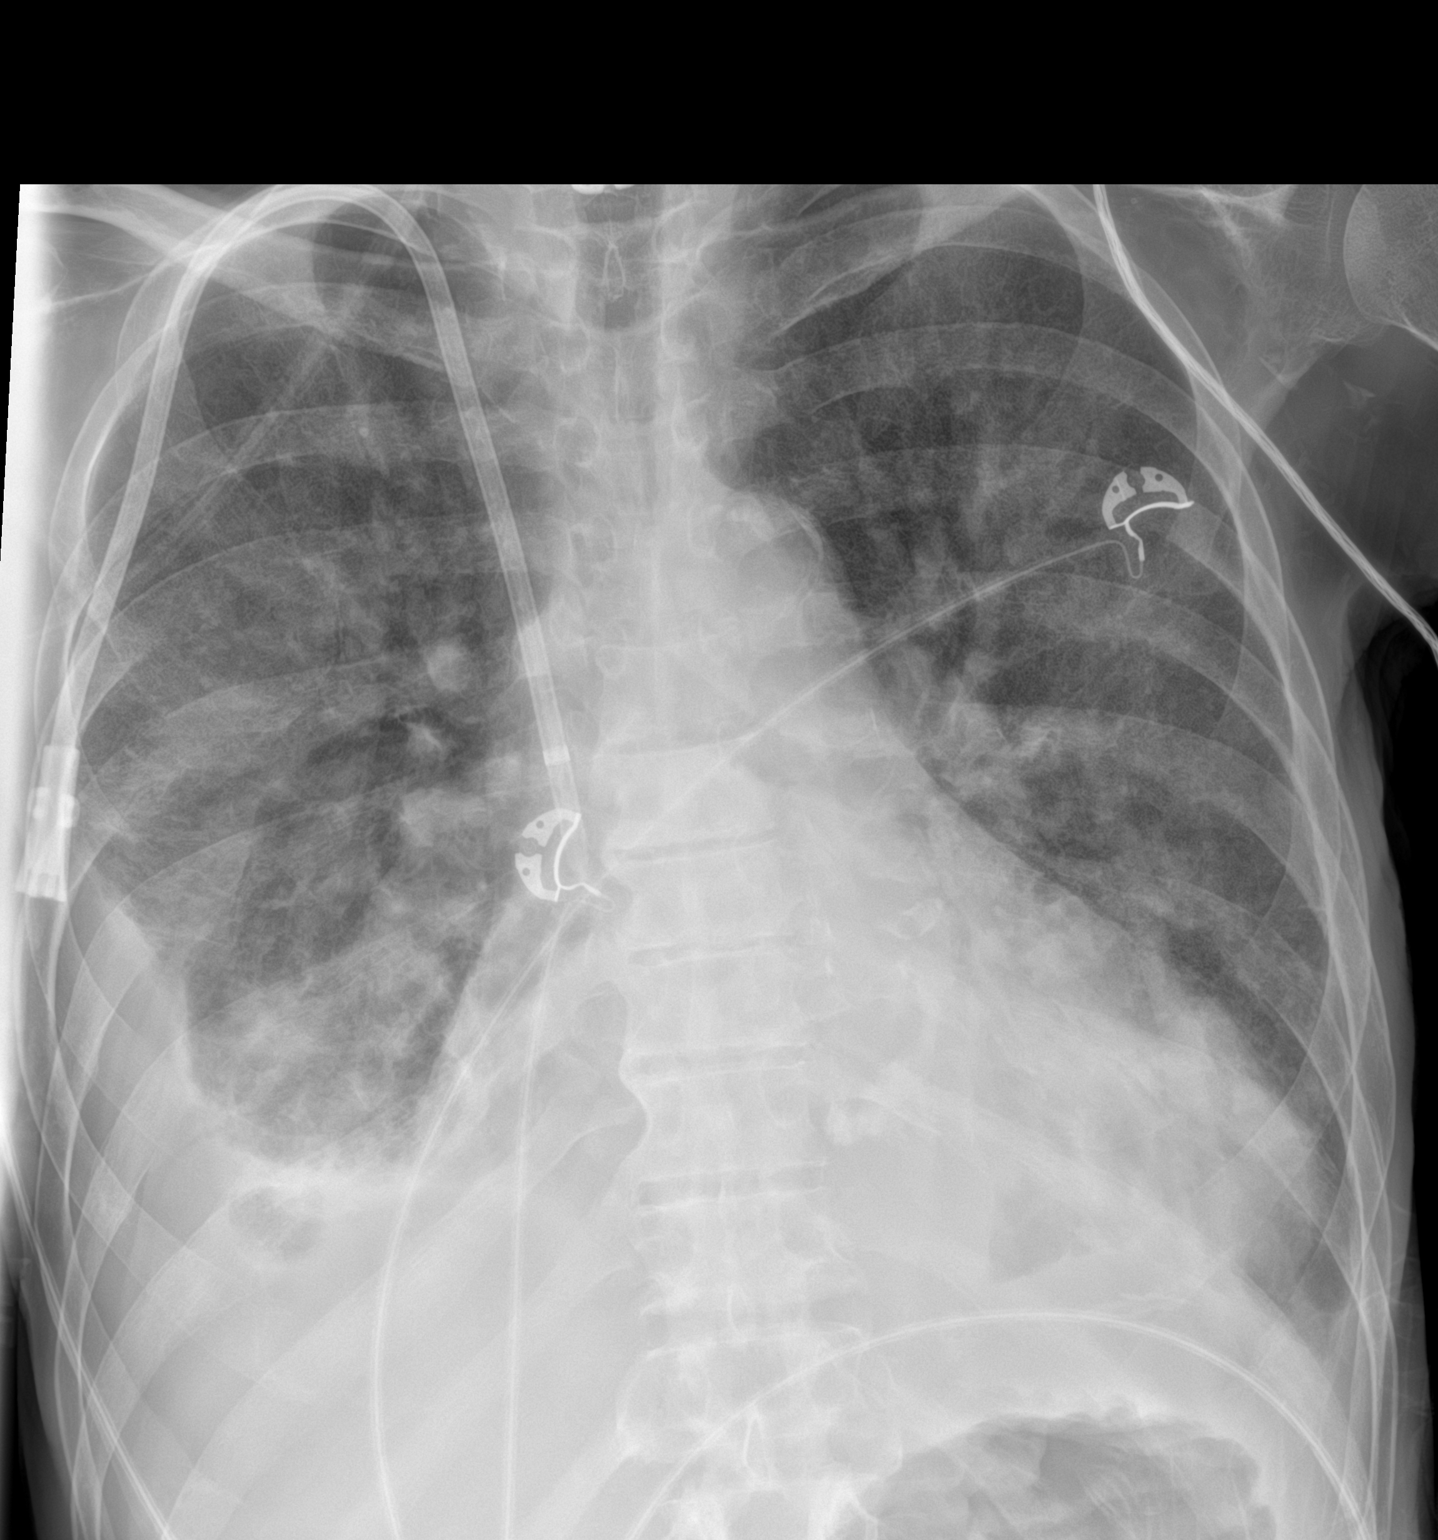

[1 of 1 positions shown; findings below may reference images not displayed]

FINDINGS: Tunneled right IJ dialysis catheter is again identified. Persistent
but decreased right pleural effusion and adjacent atelectasis.
Persistent bilateral patchy opacities and left basilar atelectasis.
Stable cardiomegaly.
IMPRESSION: Persistent but decreased right pleural effusion and adjacent
atelectasis.

Persistent bilateral opacities favored to reflect edema.

## 2022-09-16 IMAGING — DX DG CHEST 1V PORT
1 series · 1 of 1 positions shown · non-contrast
Comparison: 01/10/2022

CLINICAL DATA: Shortness of breath and COPD.

EXAM:
PORTABLE CHEST 1 VIEW

[chest]
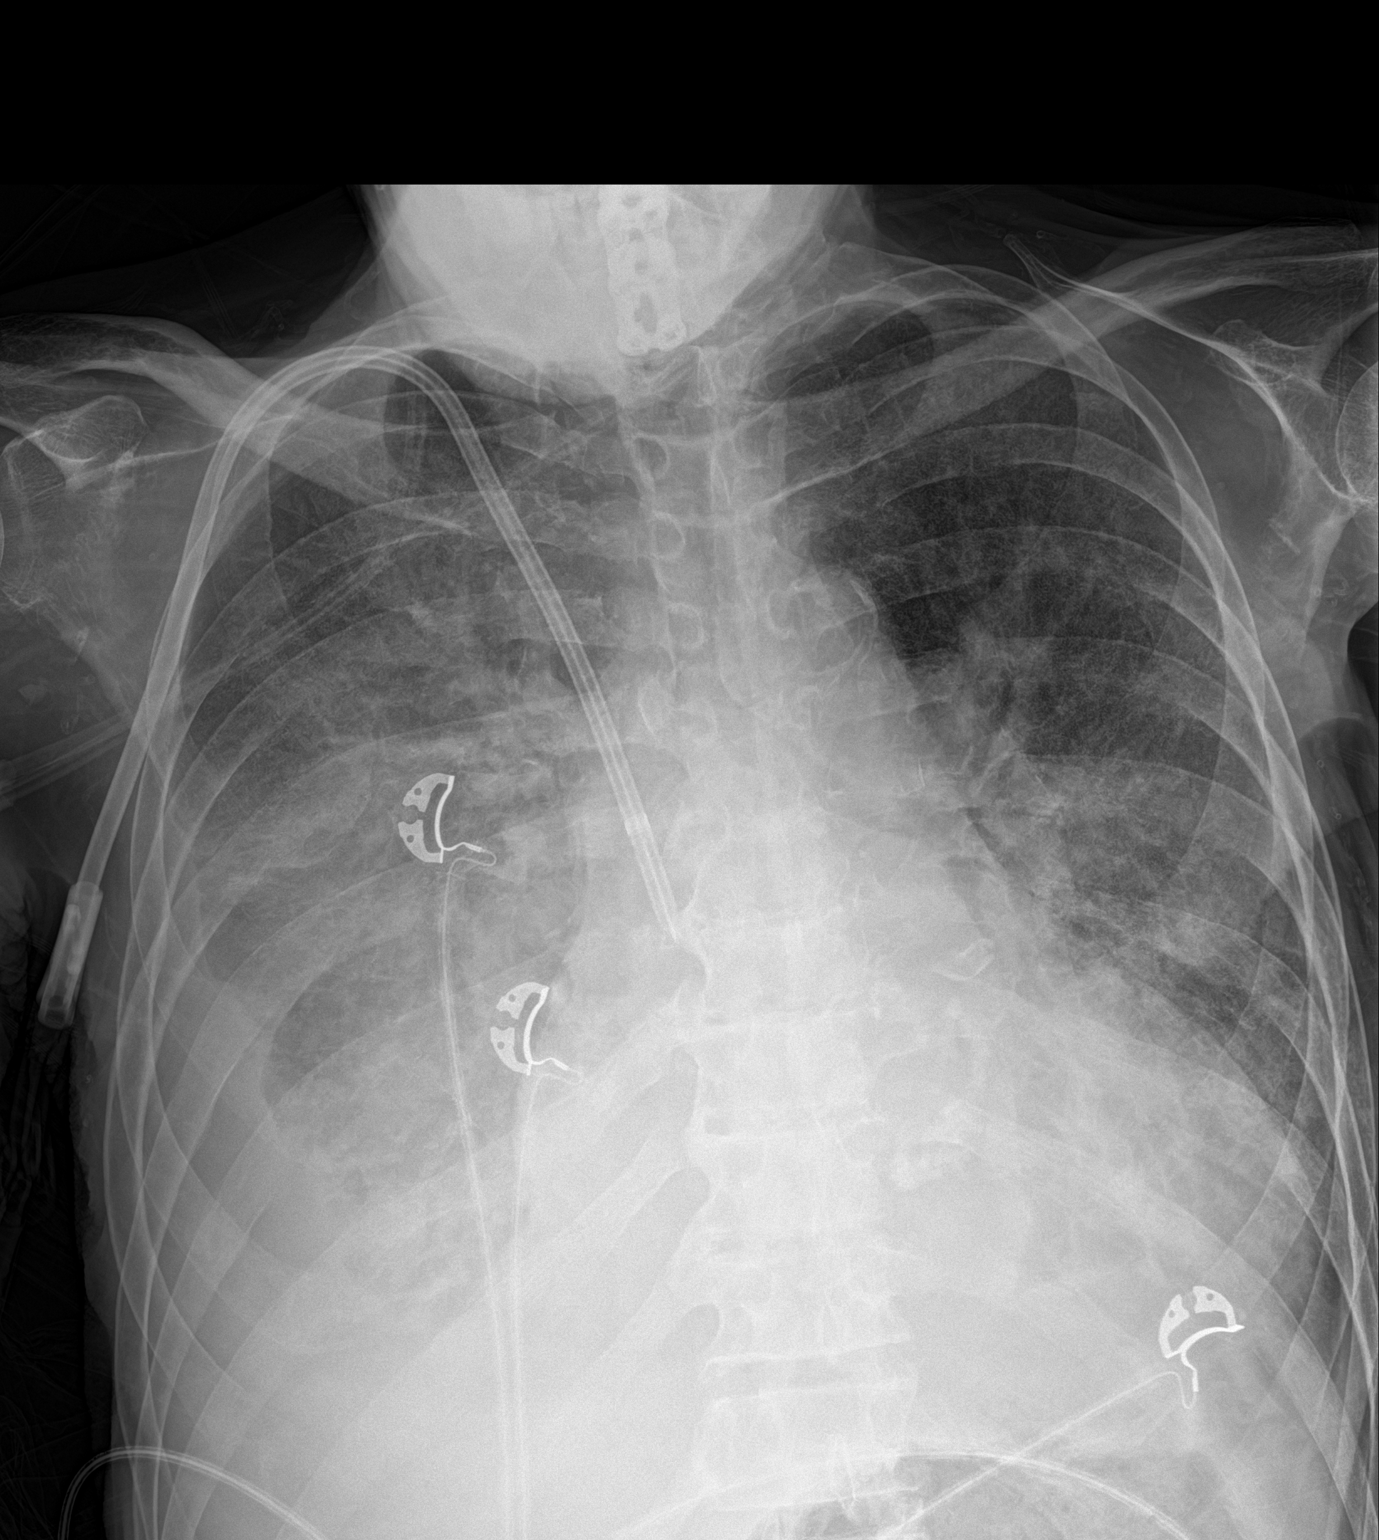

[1 of 1 positions shown; findings below may reference images not displayed]

FINDINGS: Right chest wall dialysis catheter is noted with tip projecting over
the cavoatrial junction. Stable cardiomediastinal contours. Moderate
right pleural effusion. Mild diffuse interstitial scratch set
diffuse interstitial edema pattern is unchanged. Bilateral airspace
opacities are identified with worsening aeration to the right mid
and right lower lung.
IMPRESSION: 1. Right pleural effusion and pulmonary edema, unchanged.
2. Bilateral pulmonary opacities with worsening aeration to the
right mid and right lower lung.

## 2022-09-17 IMAGING — DX DG CHEST 1V PORT
1 series · 1 of 1 positions shown · non-contrast
Comparison: 01/11/22

CLINICAL DATA: Acute on chronic respiratory failure. Shortness of
breath.

EXAM:
PORTABLE CHEST 1 VIEW

[chest]
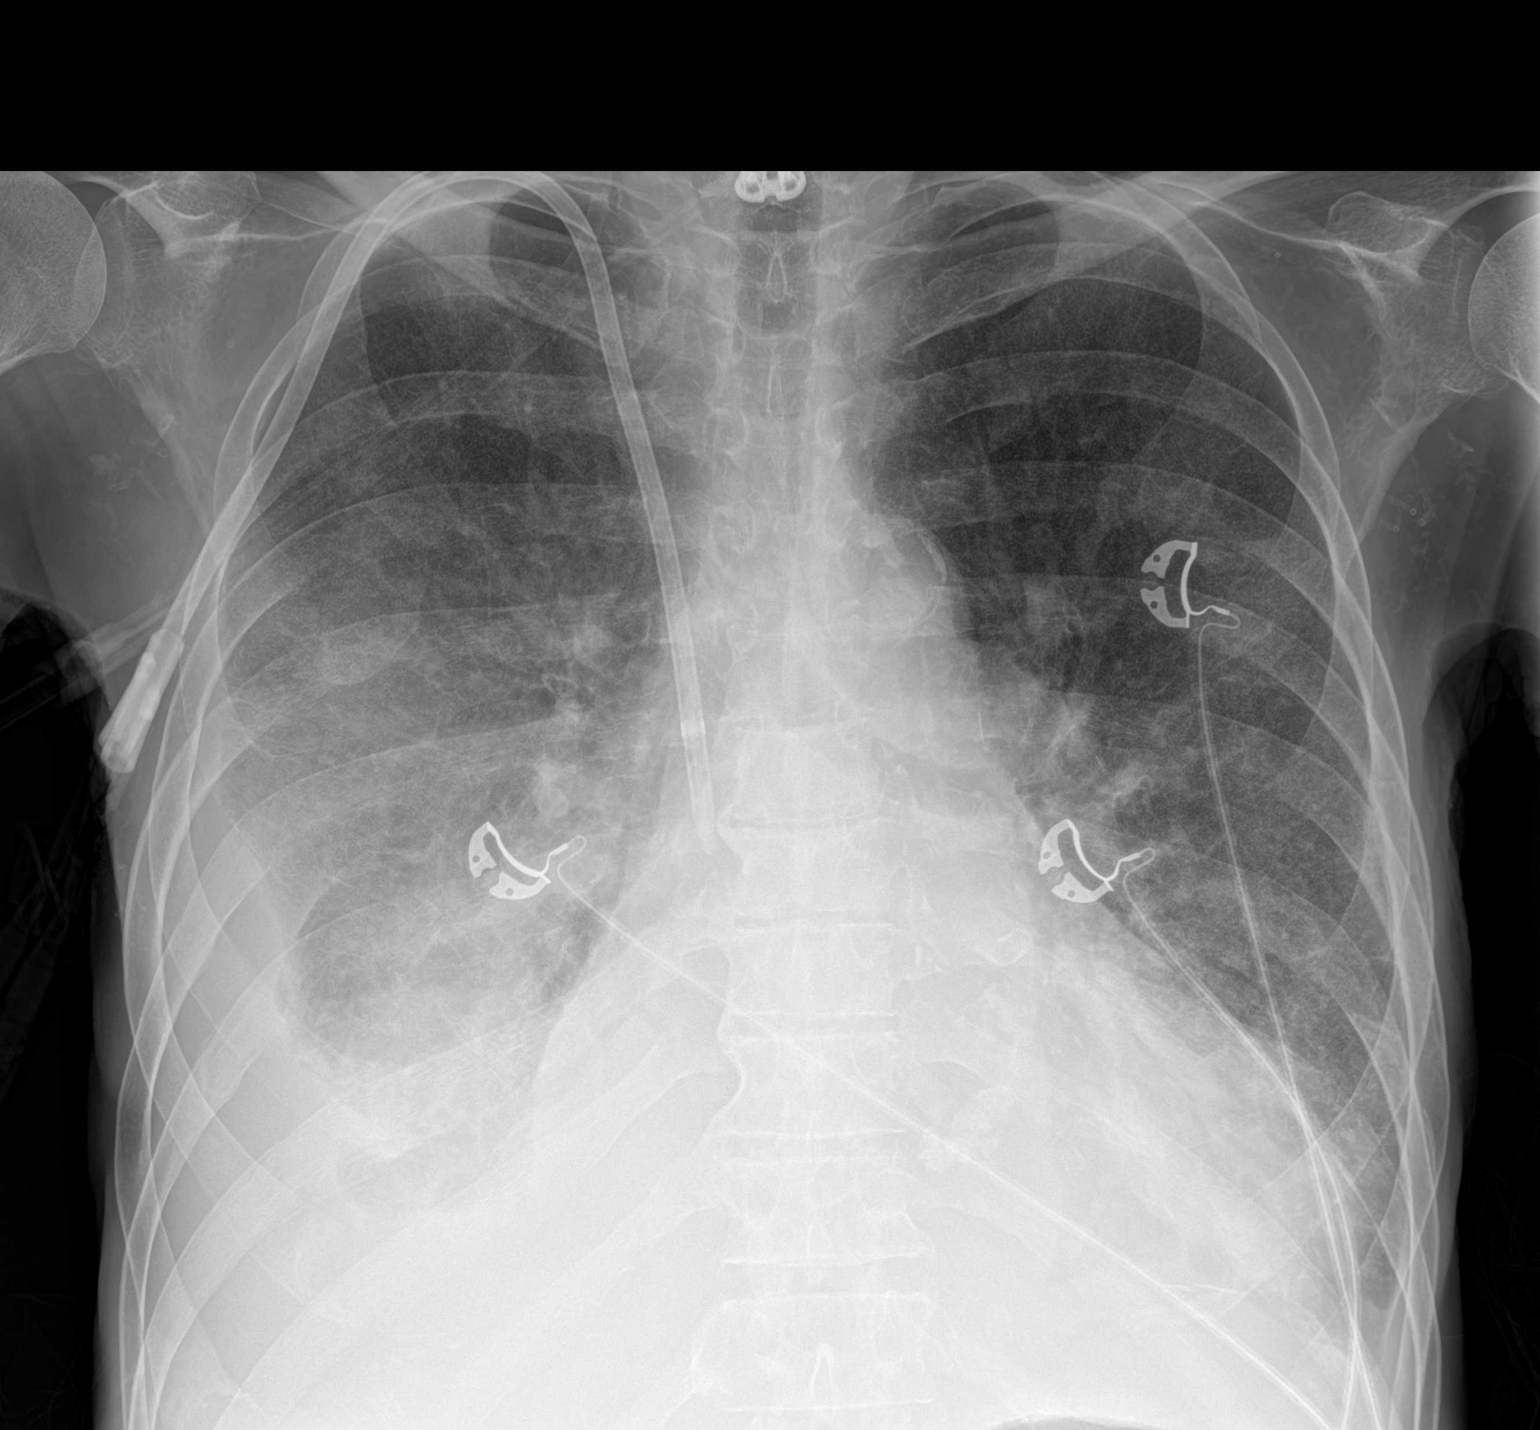

[1 of 1 positions shown; findings below may reference images not displayed]

FINDINGS: Right chest wall dialysis catheter noted with tips at the cavoatrial
junction. Stable cardiomediastinal contours. Aortic atherosclerosis.
Unchanged moderate right pleural effusion. Diffuse pulmonary edema
pattern is also unchanged in the interval. Atelectasis is noted
within both lung bases.
IMPRESSION: 1. No change in right pleural effusion and pulmonary edema.
2. Bibasilar atelectasis.

## 2022-09-18 IMAGING — DX DG CHEST 1V PORT
1 series · 1 of 1 positions shown · non-contrast
Comparison: January 12, 2022.

CLINICAL DATA: Chest pain, shortness of breath.

EXAM:
PORTABLE CHEST 1 VIEW

[chest]
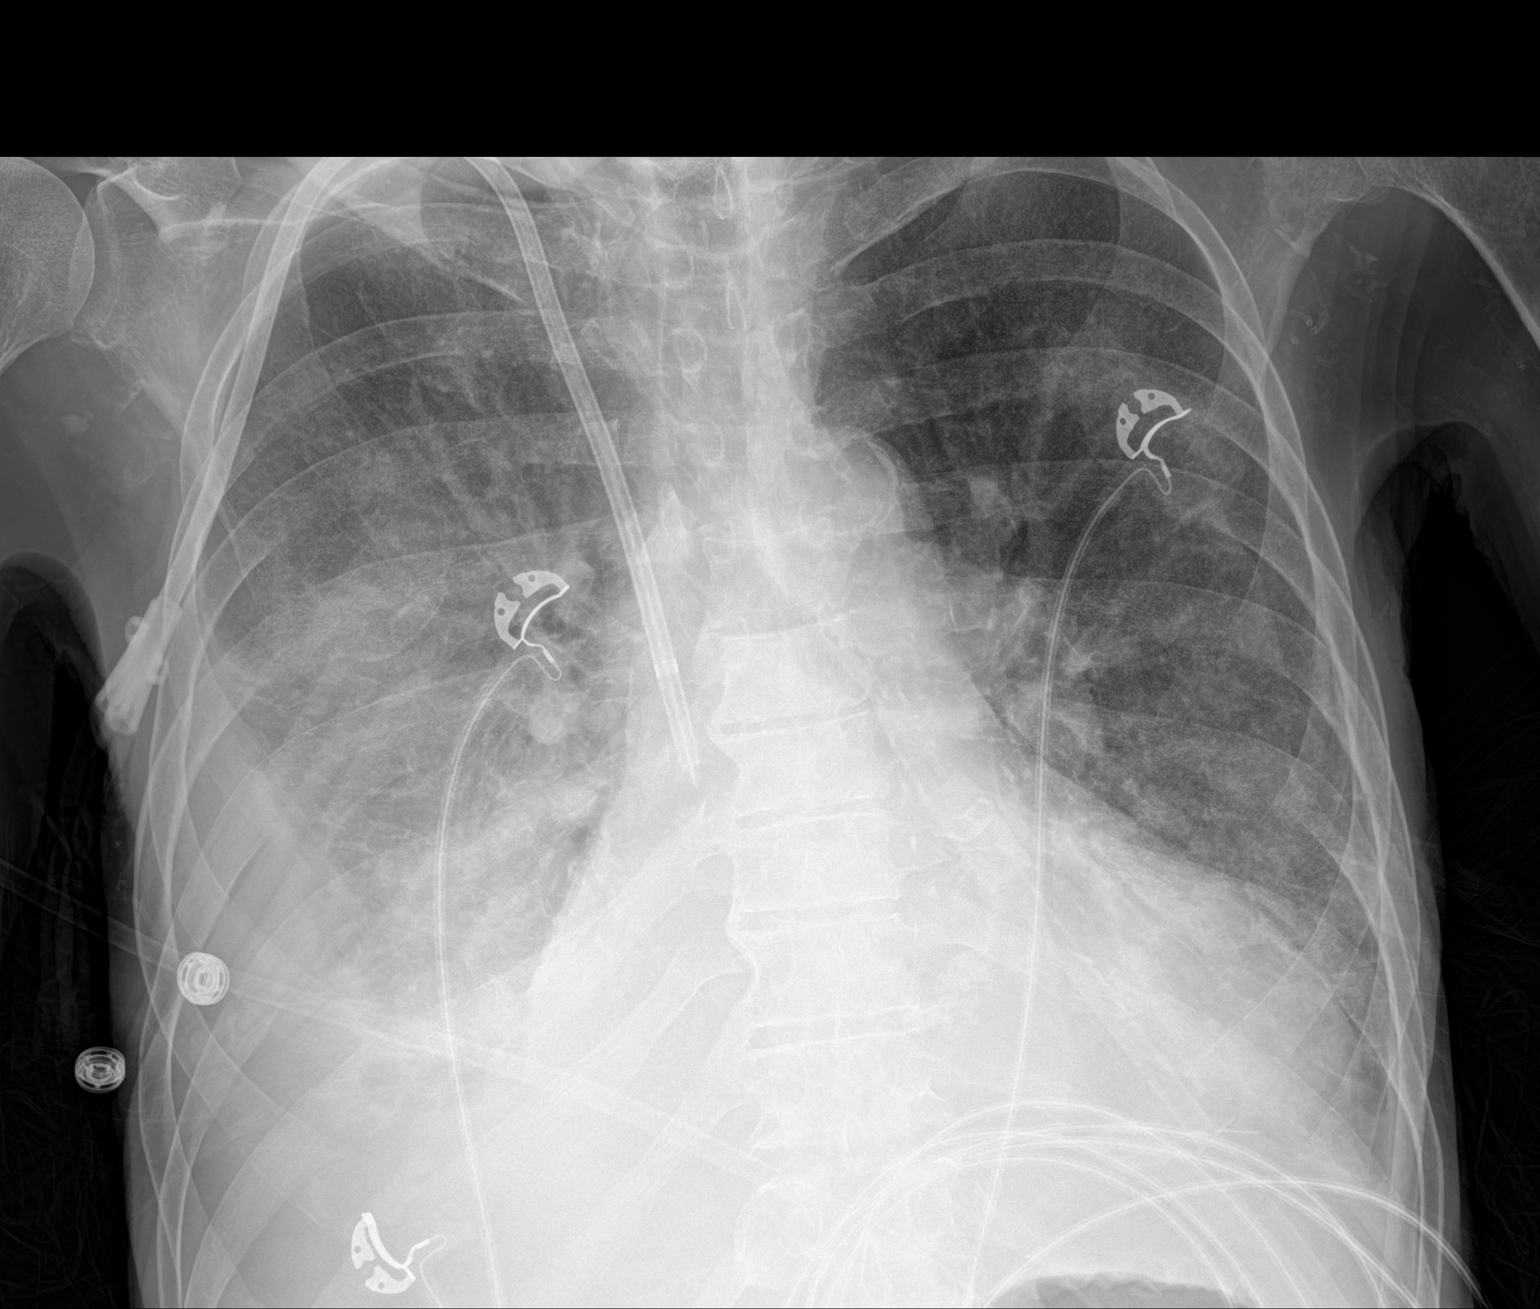

[1 of 1 positions shown; findings below may reference images not displayed]

FINDINGS: Stable cardiomegaly. Right internal jugular dialysis catheter is
unchanged. Stable probable bilateral pulmonary edema is noted with
bilateral pleural effusions, right greater than left. Bony thorax is
unremarkable.
IMPRESSION: Stable bilateral pulmonary edema is noted with bilateral pleural
effusions, right greater than left.

## 2022-09-22 IMAGING — DX DG CHEST 1V PORT
1 series · 1 of 1 positions shown · non-contrast
Comparison: 01/13/2022

CLINICAL DATA: Dyspnea

EXAM:
PORTABLE CHEST 1 VIEW

[chest]
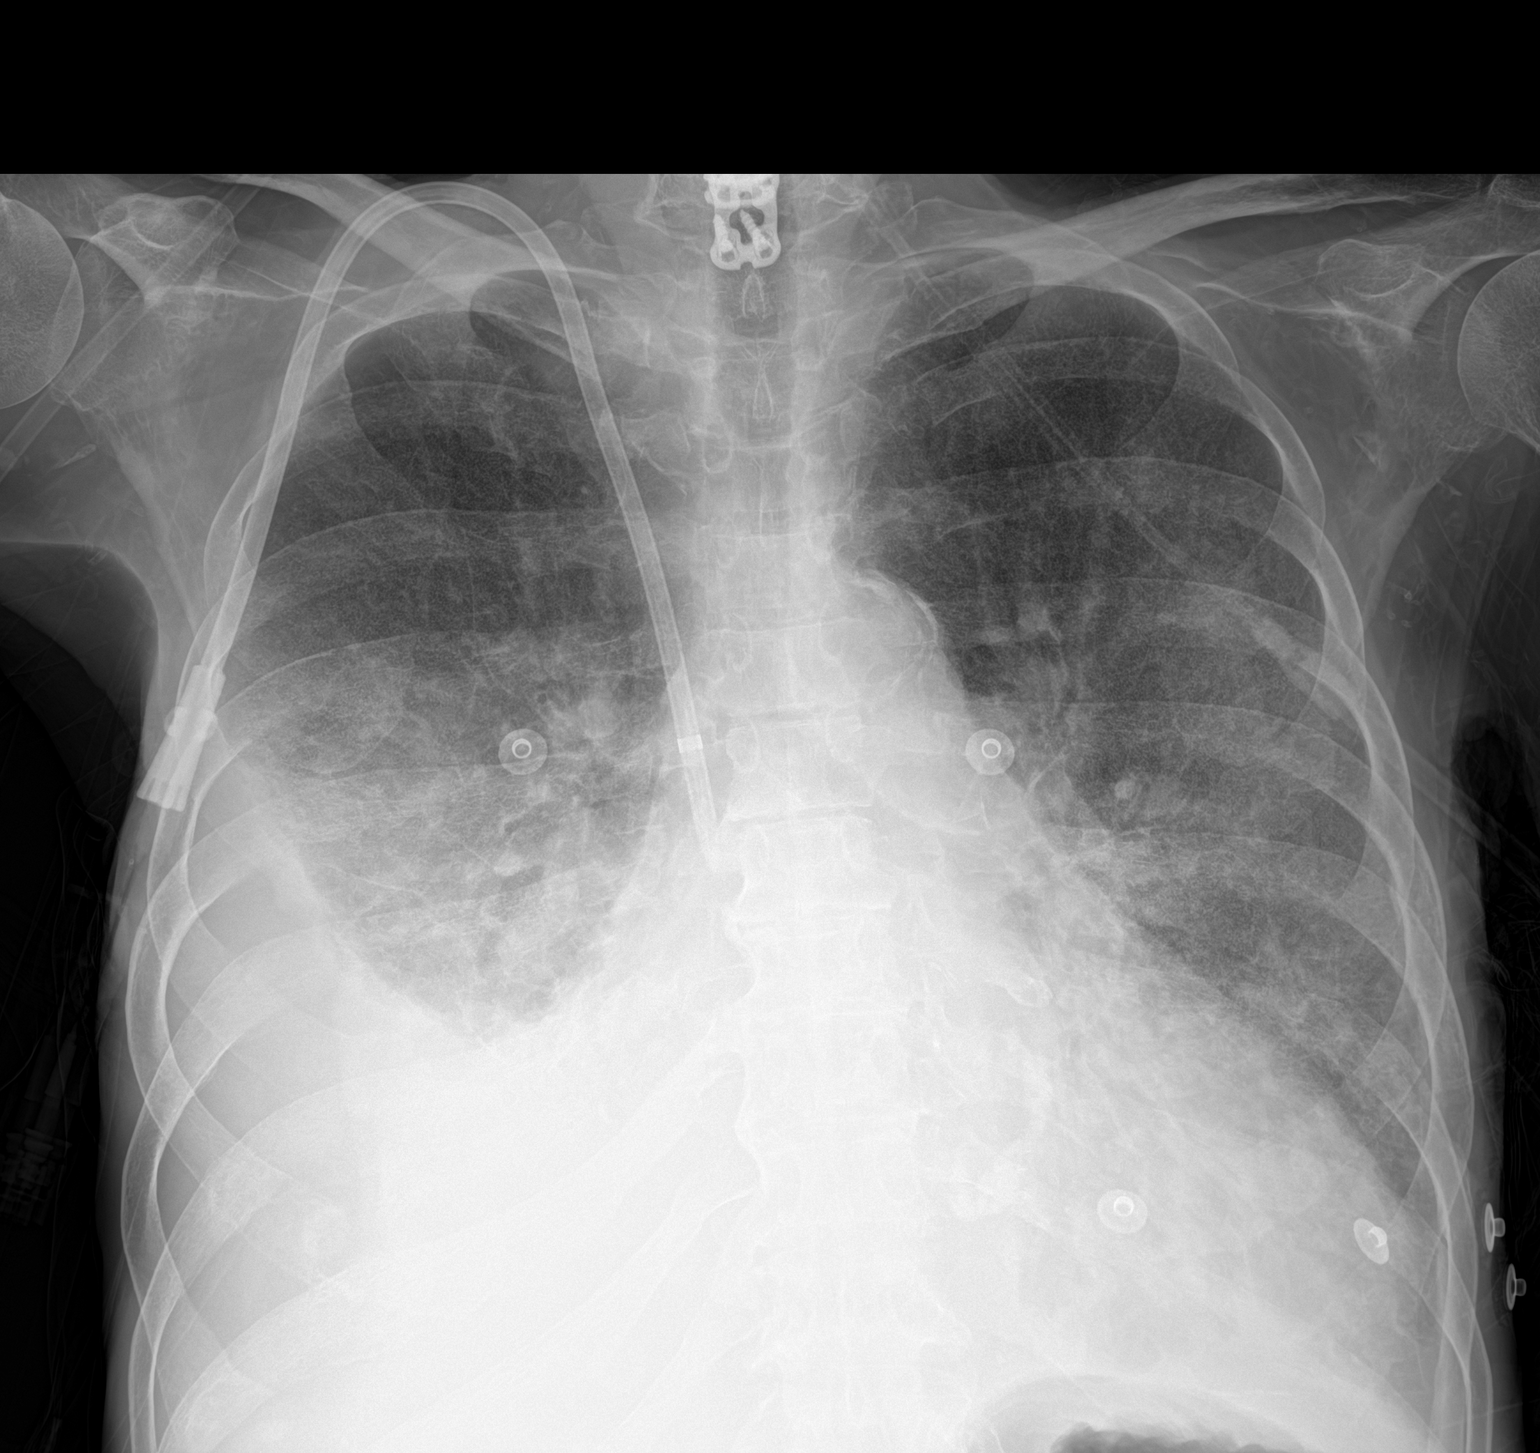

[1 of 1 positions shown; findings below may reference images not displayed]

FINDINGS: Moderate to large right pleural effusion has increased in size since
prior examination with compressive atelectasis of the right lung
base. Superimposed diffuse ground-glass pulmonary infiltrate appears
progressive within the perihilar regions, suspicious for alveolar
pulmonary edema. No pneumothorax. Right internal jugular
hemodialysis catheter tip again noted within the superior vena cava.
Mild cardiomegaly is stable. No acute bone abnormality.
IMPRESSION: Progressive diffuse pulmonary infiltrate and enlarging, moderate to
large right pleural effusion in keeping with probable changes of
progressive cardiogenic failure.

## 2022-09-23 IMAGING — DX DG CHEST 1V
1 series · 1 of 1 positions shown · non-contrast
Comparison: 01/17/2022

CLINICAL DATA: Status post right thoracentesis

EXAM:
CHEST  1 VIEW

[chest ap]
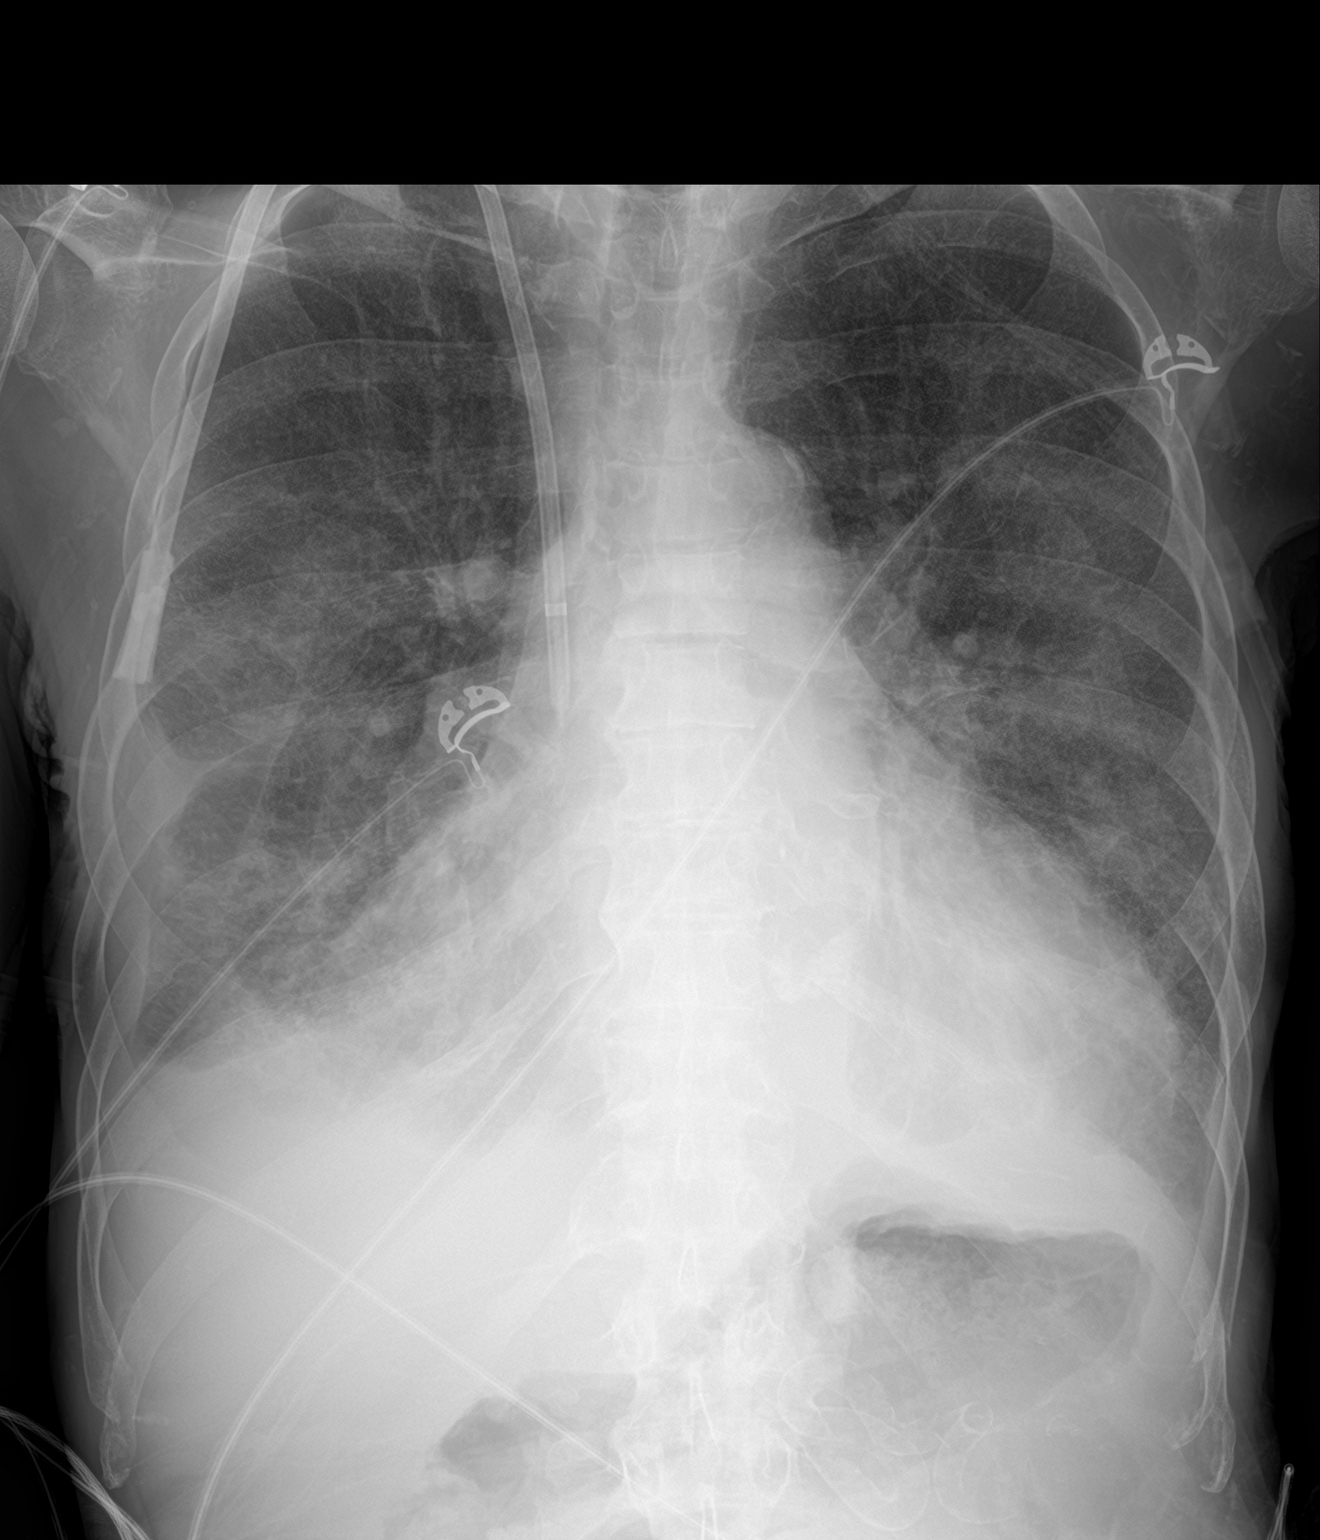

[1 of 1 positions shown; findings below may reference images not displayed]

FINDINGS: There is interval decrease in amount of right pleural effusion.
There is no pneumothorax. Small bilateral pleural effusions are
seen, more so on the right side. Transverse diameter of heart is
increased. There is decrease in pulmonary vascular congestion with
residual prominence of central pulmonary vessels. There is
improvement in aeration of parahilar regions suggesting decrease in
pulmonary edema. No new focal infiltrates are seen. There is
surgical fusion in the lower cervical spine. Tip of right IJ
dialysis catheter is seen in the superior vena cava.
IMPRESSION: Cardiomegaly. There is decrease in pulmonary vascular congestion.
There is decrease in right pleural effusion. There is no
pneumothorax.

## 2022-09-23 IMAGING — US US THORACENTESIS ASP PLEURAL SPACE W/IMG GUIDE
1 series · 1 of 1 positions shown · non-contrast
Comparison: none

INDICATION: on hemodialysis, recurrent right pleural effusion. Request IR for
therapeutic right thoracentesis.

[Series 1: us thoracentesis asp pleural space w/img guide · 1 of 1 slices shown]
[im 1/1]
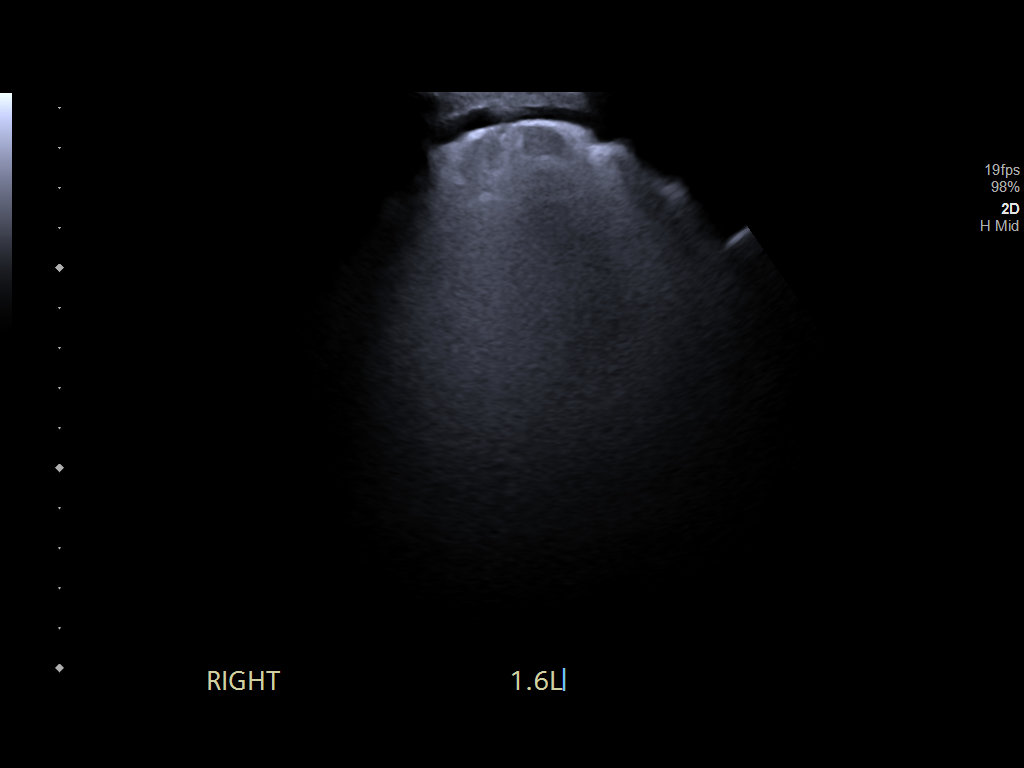

[1 of 1 positions shown; findings below may reference images not displayed]

EXAM:
ULTRASOUND GUIDED RIGHT THORACENTESIS

MEDICATIONS:
8 mL 1% lidocaine

COMPLICATIONS:
None immediate.

PROCEDURE:
An ultrasound guided thoracentesis was thoroughly discussed with the
patient and questions answered. The benefits, risks, alternatives
and complications were also discussed. The patient understands and
wishes to proceed with the procedure. Written consent was obtained.

Ultrasound was performed to localize and mark an adequate pocket of
fluid in the right chest. The area was then prepped and draped in
the normal sterile fashion. 1% Lidocaine was used for local
anesthesia. Under ultrasound guidance a 6 Fr Safe-T-Centesis
catheter was introduced. Thoracentesis was performed. The catheter
was removed and a dressing applied.
FINDINGS: A total of approximately 1.6 L of clear yellow fluid was removed.
Procedure aborted at this amount due to patient with persistent
cough and discomfort, small amount of pleural fluid remains which is
not amenable to repeat thoracentesis at this time.
IMPRESSION: Successful ultrasound guided right thoracentesis yielding 1.6 L of
pleural fluid.

Read by Utzig, Nilvando

## 2022-09-26 IMAGING — DX DG CHEST 1V PORT
1 series · 1 of 1 positions shown · non-contrast
Comparison: 01/18/2022

CLINICAL DATA: Pleural effusion.  Shortness of breath.

EXAM:
PORTABLE CHEST 1 VIEW

[chest ap]
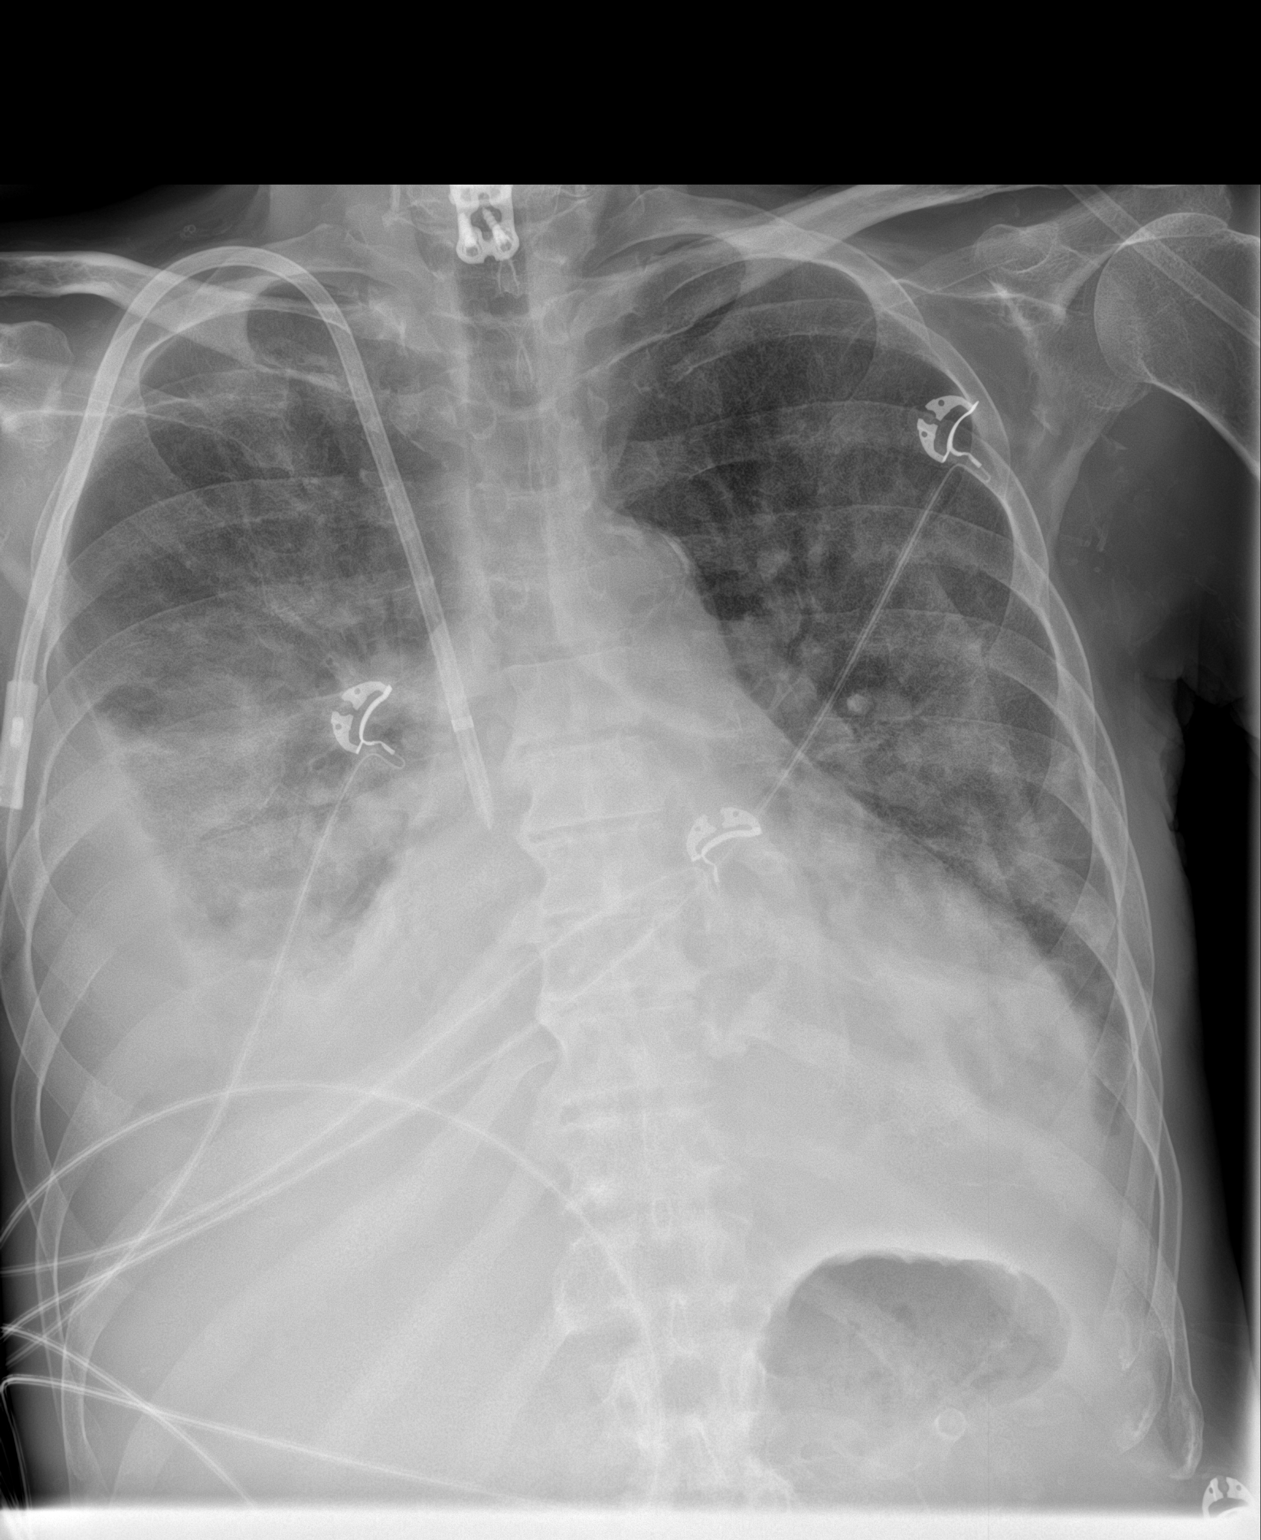

[1 of 1 positions shown; findings below may reference images not displayed]

FINDINGS: Central line remains in place with its tip at the SVC RA junction.
Enlarged cardiac silhouette as seen previously. Diffuse interstitial
pulmonary edema as seen previously. Enlarging effusions on both
sides with associated atelectasis, worse on the right than the left.
IMPRESSION: Radiographic worsening. Persistent interstitial edema. Enlarging
effusions on both sides with dependent atelectasis, right worse than
left.
